# Patient Record
Sex: Female | Born: 1955 | Race: White | Hispanic: No
Health system: Southern US, Community
[De-identification: ages and names within clinical notes are randomized; demographics above are authoritative.]

## PROBLEM LIST (undated history)

## (undated) DIAGNOSIS — I6523 Occlusion and stenosis of bilateral carotid arteries: Secondary | ICD-10-CM

## (undated) DIAGNOSIS — I639 Cerebral infarction, unspecified: Secondary | ICD-10-CM

## (undated) DIAGNOSIS — F418 Other specified anxiety disorders: Secondary | ICD-10-CM

## (undated) DIAGNOSIS — Z87442 Personal history of urinary calculi: Secondary | ICD-10-CM

## (undated) DIAGNOSIS — L0292 Furuncle, unspecified: Secondary | ICD-10-CM

## (undated) DIAGNOSIS — J449 Chronic obstructive pulmonary disease, unspecified: Secondary | ICD-10-CM

## (undated) DIAGNOSIS — Z951 Presence of aortocoronary bypass graft: Secondary | ICD-10-CM

## (undated) DIAGNOSIS — M069 Rheumatoid arthritis, unspecified: Secondary | ICD-10-CM

## (undated) DIAGNOSIS — R51 Headache: Secondary | ICD-10-CM

## (undated) DIAGNOSIS — E669 Obesity, unspecified: Secondary | ICD-10-CM

## (undated) DIAGNOSIS — I480 Paroxysmal atrial fibrillation: Secondary | ICD-10-CM

## (undated) DIAGNOSIS — I739 Peripheral vascular disease, unspecified: Secondary | ICD-10-CM

## (undated) DIAGNOSIS — I2119 ST elevation (STEMI) myocardial infarction involving other coronary artery of inferior wall: Secondary | ICD-10-CM

## (undated) DIAGNOSIS — R519 Headache, unspecified: Secondary | ICD-10-CM

## (undated) DIAGNOSIS — Z940 Kidney transplant status: Secondary | ICD-10-CM

## (undated) DIAGNOSIS — F419 Anxiety disorder, unspecified: Secondary | ICD-10-CM

## (undated) DIAGNOSIS — I200 Unstable angina: Secondary | ICD-10-CM

## (undated) DIAGNOSIS — Z8719 Personal history of other diseases of the digestive system: Secondary | ICD-10-CM

## (undated) DIAGNOSIS — F329 Major depressive disorder, single episode, unspecified: Secondary | ICD-10-CM

## (undated) DIAGNOSIS — Z9861 Coronary angioplasty status: Secondary | ICD-10-CM

## (undated) DIAGNOSIS — N186 End stage renal disease: Secondary | ICD-10-CM

## (undated) DIAGNOSIS — D649 Anemia, unspecified: Secondary | ICD-10-CM

## (undated) DIAGNOSIS — E1159 Type 2 diabetes mellitus with other circulatory complications: Secondary | ICD-10-CM

## (undated) DIAGNOSIS — G4733 Obstructive sleep apnea (adult) (pediatric): Secondary | ICD-10-CM

## (undated) DIAGNOSIS — K219 Gastro-esophageal reflux disease without esophagitis: Secondary | ICD-10-CM

## (undated) DIAGNOSIS — I2510 Atherosclerotic heart disease of native coronary artery without angina pectoris: Secondary | ICD-10-CM

## (undated) DIAGNOSIS — Z22322 Carrier or suspected carrier of Methicillin resistant Staphylococcus aureus: Secondary | ICD-10-CM

## (undated) DIAGNOSIS — I152 Hypertension secondary to endocrine disorders: Secondary | ICD-10-CM

## (undated) DIAGNOSIS — N018 Rapidly progressive nephritic syndrome with other morphologic changes: Secondary | ICD-10-CM

## (undated) DIAGNOSIS — Z8489 Family history of other specified conditions: Secondary | ICD-10-CM

## (undated) DIAGNOSIS — M797 Fibromyalgia: Secondary | ICD-10-CM

## (undated) DIAGNOSIS — R197 Diarrhea, unspecified: Secondary | ICD-10-CM

## (undated) DIAGNOSIS — E1169 Type 2 diabetes mellitus with other specified complication: Secondary | ICD-10-CM

## (undated) DIAGNOSIS — F32A Depression, unspecified: Secondary | ICD-10-CM

## (undated) DIAGNOSIS — I10 Essential (primary) hypertension: Secondary | ICD-10-CM

## (undated) DIAGNOSIS — L0293 Carbuncle, unspecified: Secondary | ICD-10-CM

## (undated) DIAGNOSIS — J189 Pneumonia, unspecified organism: Secondary | ICD-10-CM

## (undated) DIAGNOSIS — I25810 Atherosclerosis of coronary artery bypass graft(s) without angina pectoris: Secondary | ICD-10-CM

## (undated) DIAGNOSIS — E785 Hyperlipidemia, unspecified: Secondary | ICD-10-CM

## (undated) HISTORY — DX: Peripheral vascular disease, unspecified: I73.9

## (undated) HISTORY — DX: Hypertension secondary to endocrine disorders: I15.2

## (undated) HISTORY — DX: Type 2 diabetes mellitus with other circulatory complications: E11.59

## (undated) HISTORY — DX: Kidney transplant status: Z94.0

## (undated) HISTORY — DX: Rapidly progressive nephritic syndrome with other morphologic changes: N01.8

## (undated) HISTORY — PX: CATHETER REMOVAL: SHX911

## (undated) HISTORY — DX: Type 2 diabetes mellitus with other specified complication: E66.9

## (undated) HISTORY — DX: Major depressive disorder, single episode, unspecified: F32.9

## (undated) HISTORY — DX: Hyperlipidemia, unspecified: E78.5

## (undated) HISTORY — DX: Rheumatoid arthritis, unspecified: M06.9

## (undated) HISTORY — DX: Depression, unspecified: F32.A

## (undated) HISTORY — PX: LAPAROSCOPIC GASTRIC BANDING: SHX1100

## (undated) HISTORY — DX: Obstructive sleep apnea (adult) (pediatric): G47.33

## (undated) HISTORY — DX: Occlusion and stenosis of bilateral carotid arteries: I65.23

## (undated) HISTORY — DX: Anxiety disorder, unspecified: F41.9

## (undated) HISTORY — DX: Presence of aortocoronary bypass graft: Z95.1

## (undated) HISTORY — PX: PERCUTANEOUS CORONARY STENT INTERVENTION (PCI-S): SHX6016

## (undated) HISTORY — DX: Paroxysmal atrial fibrillation: I48.0

## (undated) HISTORY — PX: TUBAL LIGATION: SHX77

## (undated) HISTORY — DX: Other specified anxiety disorders: F41.8

## (undated) HISTORY — PX: PORT-A-CATH REMOVAL: SHX5289

## (undated) HISTORY — DX: Personal history of other diseases of the digestive system: Z87.19

## (undated) HISTORY — PX: LEFT HEART CATH AND CORS/GRAFTS ANGIOGRAPHY: CATH118250

## (undated) HISTORY — DX: Essential (primary) hypertension: I10

## (undated) HISTORY — DX: Atherosclerosis of coronary artery bypass graft(s) without angina pectoris: I25.810

## (undated) HISTORY — DX: Type 2 diabetes mellitus with other specified complication: E11.69

## (undated) HISTORY — DX: Furuncle, unspecified: L02.92

## (undated) HISTORY — PX: INCISE AND DRAIN ABCESS: PRO64

## (undated) HISTORY — DX: Carrier or suspected carrier of methicillin resistant Staphylococcus aureus: Z22.322

## (undated) HISTORY — DX: Carbuncle, unspecified: L02.93

## (undated) HISTORY — PX: OTHER SURGICAL HISTORY: SHX169

## (undated) HISTORY — DX: Fibromyalgia: M79.7

## (undated) HISTORY — PX: CARDIAC CATHETERIZATION: SHX172

## (undated) HISTORY — DX: Atherosclerotic heart disease of native coronary artery without angina pectoris: I25.10

## (undated) HISTORY — DX: ST elevation (STEMI) myocardial infarction involving other coronary artery of inferior wall: I21.19

## (undated) HISTORY — DX: Atherosclerotic heart disease of native coronary artery without angina pectoris: Z98.61

## (undated) HISTORY — DX: Chronic obstructive pulmonary disease, unspecified: J44.9

## (undated) HISTORY — DX: End stage renal disease: N18.6

## (undated) HISTORY — PX: MULTIPLE TOOTH EXTRACTIONS: SHX2053

## (undated) HISTORY — DX: Unstable angina: I20.0

---

## 1987-08-18 DIAGNOSIS — N018 Rapidly progressive nephritic syndrome with other morphologic changes: Secondary | ICD-10-CM

## 1987-08-18 HISTORY — DX: Rapidly progressive nephritic syndrome with other morphologic changes: N01.8

## 1989-08-17 DIAGNOSIS — N186 End stage renal disease: Secondary | ICD-10-CM | POA: Diagnosis present

## 1989-08-17 DIAGNOSIS — Z940 Kidney transplant status: Secondary | ICD-10-CM

## 1989-08-17 HISTORY — PX: KIDNEY TRANSPLANT: SHX239

## 1989-08-17 HISTORY — DX: End stage renal disease: N18.6

## 1989-08-17 HISTORY — DX: Kidney transplant status: Z94.0

## 1992-08-17 HISTORY — PX: CORONARY ANGIOPLASTY: SHX604

## 1993-07-17 DIAGNOSIS — I25110 Atherosclerotic heart disease of native coronary artery with unstable angina pectoris: Secondary | ICD-10-CM | POA: Insufficient documentation

## 1993-07-17 DIAGNOSIS — I2119 ST elevation (STEMI) myocardial infarction involving other coronary artery of inferior wall: Secondary | ICD-10-CM | POA: Insufficient documentation

## 1993-07-17 DIAGNOSIS — I2510 Atherosclerotic heart disease of native coronary artery without angina pectoris: Secondary | ICD-10-CM | POA: Insufficient documentation

## 1993-07-17 DIAGNOSIS — I25119 Atherosclerotic heart disease of native coronary artery with unspecified angina pectoris: Secondary | ICD-10-CM | POA: Insufficient documentation

## 1993-07-17 HISTORY — DX: Atherosclerotic heart disease of native coronary artery without angina pectoris: I25.10

## 1993-07-17 HISTORY — DX: ST elevation (STEMI) myocardial infarction involving other coronary artery of inferior wall: I21.19

## 1993-08-17 DIAGNOSIS — Z951 Presence of aortocoronary bypass graft: Secondary | ICD-10-CM

## 1993-08-17 HISTORY — DX: Presence of aortocoronary bypass graft: Z95.1

## 1993-08-17 HISTORY — PX: CORONARY ARTERY BYPASS GRAFT: SHX141

## 1993-09-07 DIAGNOSIS — Z951 Presence of aortocoronary bypass graft: Secondary | ICD-10-CM | POA: Insufficient documentation

## 1997-11-23 ENCOUNTER — Other Ambulatory Visit: Admission: RE | Admit: 1997-11-23 | Discharge: 1997-11-23 | Payer: Self-pay | Admitting: Nephrology

## 1998-01-29 ENCOUNTER — Other Ambulatory Visit: Admission: RE | Admit: 1998-01-29 | Discharge: 1998-01-29 | Payer: Self-pay | Admitting: Nephrology

## 1998-04-02 ENCOUNTER — Emergency Department (HOSPITAL_COMMUNITY): Admission: EM | Admit: 1998-04-02 | Discharge: 1998-04-02 | Payer: Self-pay | Admitting: Emergency Medicine

## 1998-04-02 ENCOUNTER — Encounter: Payer: Self-pay | Admitting: Emergency Medicine

## 1998-05-04 ENCOUNTER — Emergency Department (HOSPITAL_COMMUNITY): Admission: EM | Admit: 1998-05-04 | Discharge: 1998-05-04 | Payer: Self-pay | Admitting: Emergency Medicine

## 1998-05-27 ENCOUNTER — Other Ambulatory Visit: Admission: RE | Admit: 1998-05-27 | Discharge: 1998-05-27 | Payer: Self-pay | Admitting: Critical Care Medicine

## 1999-06-09 ENCOUNTER — Encounter: Admission: RE | Admit: 1999-06-09 | Discharge: 1999-09-07 | Payer: Self-pay | Admitting: Nephrology

## 1999-06-25 ENCOUNTER — Encounter: Payer: Self-pay | Admitting: Nephrology

## 1999-06-25 ENCOUNTER — Encounter: Admission: RE | Admit: 1999-06-25 | Discharge: 1999-06-25 | Payer: Self-pay | Admitting: Nephrology

## 1999-07-01 ENCOUNTER — Encounter: Payer: Self-pay | Admitting: Nephrology

## 1999-07-01 ENCOUNTER — Encounter: Admission: RE | Admit: 1999-07-01 | Discharge: 1999-07-01 | Payer: Self-pay | Admitting: Nephrology

## 1999-07-26 ENCOUNTER — Inpatient Hospital Stay (HOSPITAL_COMMUNITY): Admission: EM | Admit: 1999-07-26 | Discharge: 1999-07-29 | Payer: Self-pay | Admitting: Emergency Medicine

## 1999-07-26 ENCOUNTER — Encounter: Payer: Self-pay | Admitting: Nephrology

## 1999-10-17 ENCOUNTER — Other Ambulatory Visit: Admission: RE | Admit: 1999-10-17 | Discharge: 1999-10-17 | Payer: Self-pay | Admitting: Obstetrics and Gynecology

## 1999-11-05 ENCOUNTER — Ambulatory Visit (HOSPITAL_COMMUNITY): Admission: RE | Admit: 1999-11-05 | Discharge: 1999-11-05 | Payer: Self-pay | Admitting: Pulmonary Disease

## 1999-11-05 ENCOUNTER — Encounter: Payer: Self-pay | Admitting: Pulmonary Disease

## 1999-11-19 ENCOUNTER — Encounter: Admission: RE | Admit: 1999-11-19 | Discharge: 2000-02-17 | Payer: Self-pay | Admitting: Nephrology

## 1999-12-24 ENCOUNTER — Inpatient Hospital Stay (HOSPITAL_COMMUNITY): Admission: EM | Admit: 1999-12-24 | Discharge: 1999-12-26 | Payer: Self-pay | Admitting: Emergency Medicine

## 1999-12-24 ENCOUNTER — Encounter: Payer: Self-pay | Admitting: Cardiovascular Disease

## 1999-12-25 ENCOUNTER — Encounter: Payer: Self-pay | Admitting: Cardiovascular Disease

## 2000-01-13 ENCOUNTER — Encounter: Payer: Self-pay | Admitting: Otolaryngology

## 2000-01-13 ENCOUNTER — Encounter: Admission: RE | Admit: 2000-01-13 | Discharge: 2000-01-13 | Payer: Self-pay | Admitting: Otolaryngology

## 2000-01-27 ENCOUNTER — Encounter: Admission: RE | Admit: 2000-01-27 | Discharge: 2000-01-27 | Payer: Self-pay | Admitting: Obstetrics and Gynecology

## 2000-01-27 ENCOUNTER — Encounter: Payer: Self-pay | Admitting: Obstetrics and Gynecology

## 2000-04-16 ENCOUNTER — Ambulatory Visit (HOSPITAL_BASED_OUTPATIENT_CLINIC_OR_DEPARTMENT_OTHER): Admission: RE | Admit: 2000-04-16 | Discharge: 2000-04-16 | Payer: Self-pay | Admitting: Pulmonary Disease

## 2000-05-26 ENCOUNTER — Encounter: Admission: RE | Admit: 2000-05-26 | Discharge: 2000-08-24 | Payer: Self-pay | Admitting: Nephrology

## 2000-06-24 ENCOUNTER — Encounter: Payer: Self-pay | Admitting: Nephrology

## 2000-06-24 ENCOUNTER — Inpatient Hospital Stay (HOSPITAL_COMMUNITY): Admission: EM | Admit: 2000-06-24 | Discharge: 2000-06-26 | Payer: Self-pay | Admitting: Emergency Medicine

## 2000-08-19 ENCOUNTER — Encounter: Payer: Self-pay | Admitting: Nephrology

## 2000-08-19 ENCOUNTER — Encounter: Admission: RE | Admit: 2000-08-19 | Discharge: 2000-08-19 | Payer: Self-pay | Admitting: Nephrology

## 2000-09-08 ENCOUNTER — Encounter: Payer: Self-pay | Admitting: Nephrology

## 2000-09-08 ENCOUNTER — Encounter: Admission: RE | Admit: 2000-09-08 | Discharge: 2000-09-08 | Payer: Self-pay | Admitting: Nephrology

## 2000-09-16 ENCOUNTER — Encounter: Admission: RE | Admit: 2000-09-16 | Discharge: 2000-12-15 | Payer: Self-pay | Admitting: Nephrology

## 2000-10-28 ENCOUNTER — Encounter: Payer: Self-pay | Admitting: Cardiovascular Disease

## 2000-10-28 ENCOUNTER — Ambulatory Visit (HOSPITAL_COMMUNITY): Admission: RE | Admit: 2000-10-28 | Discharge: 2000-10-29 | Payer: Self-pay | Admitting: Cardiovascular Disease

## 2000-12-21 ENCOUNTER — Encounter (HOSPITAL_COMMUNITY): Admission: RE | Admit: 2000-12-21 | Discharge: 2001-03-21 | Payer: Self-pay | Admitting: Nephrology

## 2000-12-22 ENCOUNTER — Encounter (HOSPITAL_COMMUNITY): Admission: RE | Admit: 2000-12-22 | Discharge: 2001-03-22 | Payer: Self-pay | Admitting: Nephrology

## 2000-12-23 ENCOUNTER — Ambulatory Visit (HOSPITAL_COMMUNITY): Admission: RE | Admit: 2000-12-23 | Discharge: 2000-12-23 | Payer: Self-pay | Admitting: Nephrology

## 2000-12-28 ENCOUNTER — Other Ambulatory Visit: Admission: RE | Admit: 2000-12-28 | Discharge: 2000-12-28 | Payer: Self-pay | Admitting: Obstetrics and Gynecology

## 2001-07-23 ENCOUNTER — Inpatient Hospital Stay (HOSPITAL_COMMUNITY): Admission: EM | Admit: 2001-07-23 | Discharge: 2001-07-26 | Payer: Self-pay | Admitting: Emergency Medicine

## 2001-07-24 ENCOUNTER — Encounter: Payer: Self-pay | Admitting: Nephrology

## 2001-08-05 ENCOUNTER — Inpatient Hospital Stay (HOSPITAL_COMMUNITY): Admission: EM | Admit: 2001-08-05 | Discharge: 2001-08-07 | Payer: Self-pay | Admitting: Emergency Medicine

## 2001-12-20 ENCOUNTER — Encounter: Admission: RE | Admit: 2001-12-20 | Discharge: 2001-12-20 | Payer: Self-pay | Admitting: Nephrology

## 2001-12-20 ENCOUNTER — Encounter: Payer: Self-pay | Admitting: Nephrology

## 2001-12-30 ENCOUNTER — Encounter: Admission: RE | Admit: 2001-12-30 | Discharge: 2001-12-30 | Payer: Self-pay | Admitting: Orthopedic Surgery

## 2001-12-30 ENCOUNTER — Encounter: Payer: Self-pay | Admitting: Orthopedic Surgery

## 2002-02-24 ENCOUNTER — Encounter: Payer: Self-pay | Admitting: Nephrology

## 2002-02-24 ENCOUNTER — Encounter: Admission: RE | Admit: 2002-02-24 | Discharge: 2002-02-24 | Payer: Self-pay | Admitting: Nephrology

## 2002-03-31 ENCOUNTER — Encounter: Admission: RE | Admit: 2002-03-31 | Discharge: 2002-03-31 | Payer: Self-pay | Admitting: Nephrology

## 2002-03-31 ENCOUNTER — Encounter: Payer: Self-pay | Admitting: Nephrology

## 2002-04-10 ENCOUNTER — Encounter: Payer: Self-pay | Admitting: Cardiovascular Disease

## 2002-04-10 ENCOUNTER — Inpatient Hospital Stay (HOSPITAL_COMMUNITY): Admission: AD | Admit: 2002-04-10 | Discharge: 2002-04-13 | Payer: Self-pay | Admitting: Cardiovascular Disease

## 2002-04-13 ENCOUNTER — Encounter: Payer: Self-pay | Admitting: Cardiovascular Disease

## 2002-04-19 ENCOUNTER — Encounter: Payer: Self-pay | Admitting: Nephrology

## 2002-04-19 ENCOUNTER — Encounter: Admission: RE | Admit: 2002-04-19 | Discharge: 2002-04-19 | Payer: Self-pay | Admitting: Nephrology

## 2002-04-23 ENCOUNTER — Inpatient Hospital Stay (HOSPITAL_COMMUNITY): Admission: EM | Admit: 2002-04-23 | Discharge: 2002-05-01 | Payer: Self-pay | Admitting: *Deleted

## 2002-04-23 ENCOUNTER — Encounter (INDEPENDENT_AMBULATORY_CARE_PROVIDER_SITE_OTHER): Payer: Self-pay | Admitting: *Deleted

## 2002-04-23 ENCOUNTER — Encounter: Payer: Self-pay | Admitting: Nephrology

## 2002-04-25 ENCOUNTER — Encounter: Payer: Self-pay | Admitting: Nephrology

## 2002-05-01 ENCOUNTER — Encounter: Payer: Self-pay | Admitting: Nephrology

## 2002-05-05 ENCOUNTER — Encounter (HOSPITAL_COMMUNITY): Admission: RE | Admit: 2002-05-05 | Discharge: 2002-08-03 | Payer: Self-pay | Admitting: Nephrology

## 2002-08-14 ENCOUNTER — Encounter (HOSPITAL_COMMUNITY): Admission: RE | Admit: 2002-08-14 | Discharge: 2002-11-12 | Payer: Self-pay | Admitting: Nephrology

## 2002-12-31 ENCOUNTER — Emergency Department (HOSPITAL_COMMUNITY): Admission: AD | Admit: 2002-12-31 | Discharge: 2002-12-31 | Payer: Self-pay | Admitting: Emergency Medicine

## 2003-01-17 ENCOUNTER — Encounter: Payer: Self-pay | Admitting: Nephrology

## 2003-01-17 ENCOUNTER — Encounter: Admission: RE | Admit: 2003-01-17 | Discharge: 2003-01-17 | Payer: Self-pay | Admitting: Nephrology

## 2003-02-14 ENCOUNTER — Emergency Department (HOSPITAL_COMMUNITY): Admission: AD | Admit: 2003-02-14 | Discharge: 2003-02-14 | Payer: Self-pay | Admitting: *Deleted

## 2003-02-14 ENCOUNTER — Encounter: Payer: Self-pay | Admitting: Emergency Medicine

## 2003-03-30 ENCOUNTER — Encounter: Payer: Self-pay | Admitting: Orthopedic Surgery

## 2003-04-02 ENCOUNTER — Ambulatory Visit (HOSPITAL_COMMUNITY): Admission: RE | Admit: 2003-04-02 | Discharge: 2003-04-03 | Payer: Self-pay | Admitting: Orthopedic Surgery

## 2003-06-06 ENCOUNTER — Encounter: Payer: Self-pay | Admitting: Cardiovascular Disease

## 2003-06-06 ENCOUNTER — Encounter: Payer: Self-pay | Admitting: Cardiology

## 2003-06-06 ENCOUNTER — Inpatient Hospital Stay (HOSPITAL_COMMUNITY): Admission: EM | Admit: 2003-06-06 | Discharge: 2003-06-09 | Payer: Self-pay | Admitting: Emergency Medicine

## 2003-06-29 ENCOUNTER — Encounter (HOSPITAL_COMMUNITY): Admission: RE | Admit: 2003-06-29 | Discharge: 2003-06-29 | Payer: Self-pay | Admitting: Nephrology

## 2003-06-29 ENCOUNTER — Encounter: Admission: RE | Admit: 2003-06-29 | Discharge: 2003-06-29 | Payer: Self-pay | Admitting: Nephrology

## 2003-06-30 ENCOUNTER — Inpatient Hospital Stay (HOSPITAL_COMMUNITY): Admission: EM | Admit: 2003-06-30 | Discharge: 2003-07-04 | Payer: Self-pay | Admitting: Emergency Medicine

## 2003-07-03 ENCOUNTER — Encounter (INDEPENDENT_AMBULATORY_CARE_PROVIDER_SITE_OTHER): Payer: Self-pay | Admitting: *Deleted

## 2003-07-04 ENCOUNTER — Encounter: Admission: RE | Admit: 2003-07-04 | Discharge: 2003-10-02 | Payer: Self-pay | Admitting: Nephrology

## 2003-07-09 ENCOUNTER — Other Ambulatory Visit: Admission: RE | Admit: 2003-07-09 | Discharge: 2003-07-09 | Payer: Self-pay | Admitting: Obstetrics and Gynecology

## 2003-09-10 ENCOUNTER — Encounter: Admission: RE | Admit: 2003-09-10 | Discharge: 2003-12-09 | Payer: Self-pay | Admitting: General Surgery

## 2003-09-14 ENCOUNTER — Ambulatory Visit (HOSPITAL_COMMUNITY): Admission: RE | Admit: 2003-09-14 | Discharge: 2003-09-14 | Payer: Self-pay | Admitting: General Surgery

## 2003-10-05 ENCOUNTER — Encounter (HOSPITAL_COMMUNITY): Admission: RE | Admit: 2003-10-05 | Discharge: 2004-01-03 | Payer: Self-pay | Admitting: Nephrology

## 2004-01-11 ENCOUNTER — Encounter (HOSPITAL_COMMUNITY): Admission: RE | Admit: 2004-01-11 | Discharge: 2004-04-10 | Payer: Self-pay | Admitting: Nephrology

## 2004-01-12 ENCOUNTER — Ambulatory Visit (HOSPITAL_COMMUNITY): Admission: RE | Admit: 2004-01-12 | Discharge: 2004-01-12 | Payer: Self-pay | Admitting: Physician Assistant

## 2004-03-03 ENCOUNTER — Encounter: Admission: RE | Admit: 2004-03-03 | Discharge: 2004-03-03 | Payer: Self-pay | Admitting: General Surgery

## 2004-03-06 ENCOUNTER — Encounter: Admission: RE | Admit: 2004-03-06 | Discharge: 2004-03-06 | Payer: Self-pay | Admitting: General Surgery

## 2004-03-20 ENCOUNTER — Ambulatory Visit (HOSPITAL_COMMUNITY): Admission: RE | Admit: 2004-03-20 | Discharge: 2004-03-20 | Payer: Self-pay | Admitting: General Surgery

## 2004-04-15 ENCOUNTER — Encounter (HOSPITAL_COMMUNITY): Admission: RE | Admit: 2004-04-15 | Discharge: 2004-07-14 | Payer: Self-pay | Admitting: Nephrology

## 2004-04-18 ENCOUNTER — Encounter: Admission: RE | Admit: 2004-04-18 | Discharge: 2004-07-17 | Payer: Self-pay | Admitting: General Surgery

## 2004-04-29 ENCOUNTER — Observation Stay (HOSPITAL_COMMUNITY): Admission: RE | Admit: 2004-04-29 | Discharge: 2004-04-30 | Payer: Self-pay | Admitting: Surgery

## 2004-05-14 ENCOUNTER — Encounter: Admission: RE | Admit: 2004-05-14 | Discharge: 2004-05-14 | Payer: Self-pay | Admitting: Nephrology

## 2004-07-12 IMAGING — CT CT ABDOMEN W/O CM
1 series · 15 of 32 positions shown, 19 images · IV contrast (GASTRO)
Comparison: none

CLINICAL DATA: Decreased hemoglobin.  Back pain and possible retroperitoneal hemorrhage.

 CT OF THE ABDOMEN WITHOUT CONTRAST ? 06/29/03
 Comparing 02 April, 1998.
TECHNIQUE: Contiguous axial CT images were obtained from the lung bases through the iliac crests following oral contrast but not IV contrast.
TECHNIQUE: Contiguous axial CT images were obtained from the iliac crests to the proximal femurs.

[Series 2: — · axial · 0.98mm/px · z∈[-429,-44]mm · 15 of 86 slices shown, 19 images]
[im 6/86  soft-tissue]
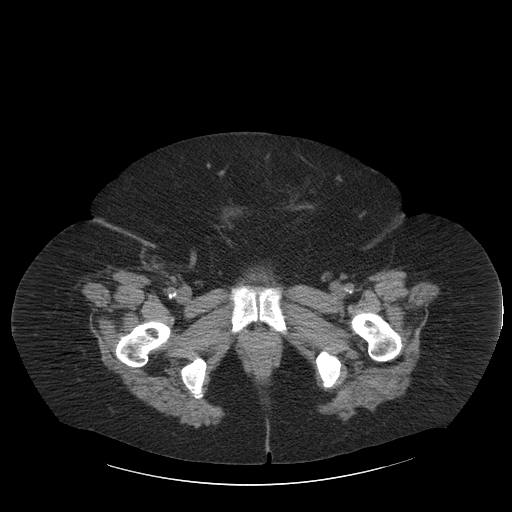
[im 6/86  bone]
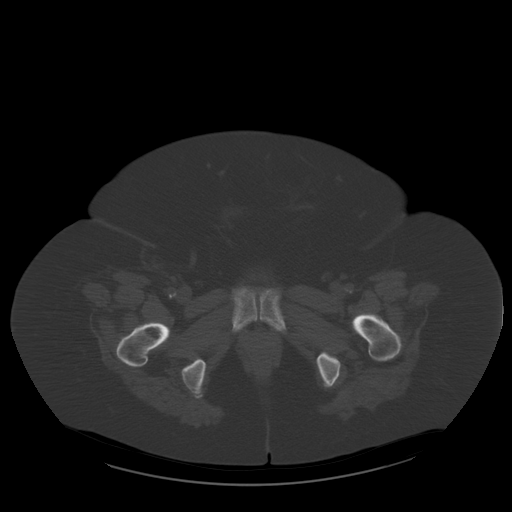
[im 11/86  soft-tissue]
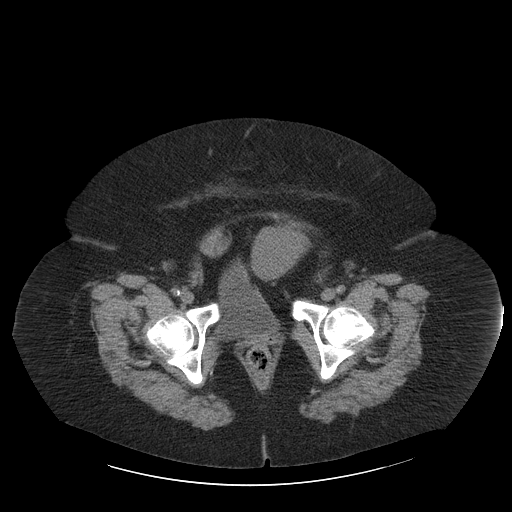
[im 17/86  soft-tissue]
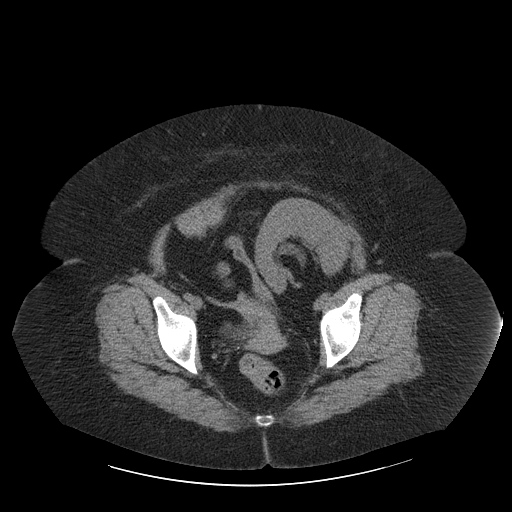
[im 25/86  soft-tissue]
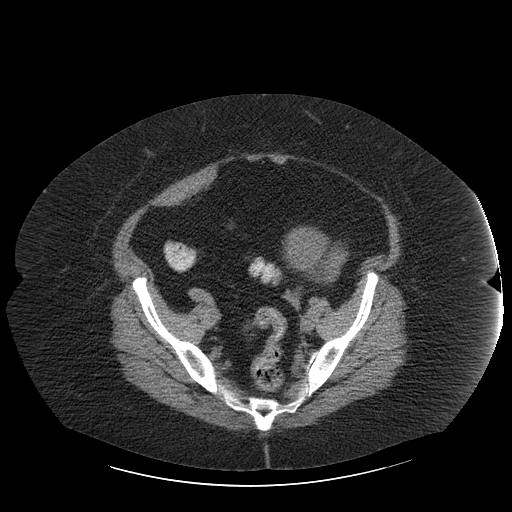
[im 31/86  soft-tissue]
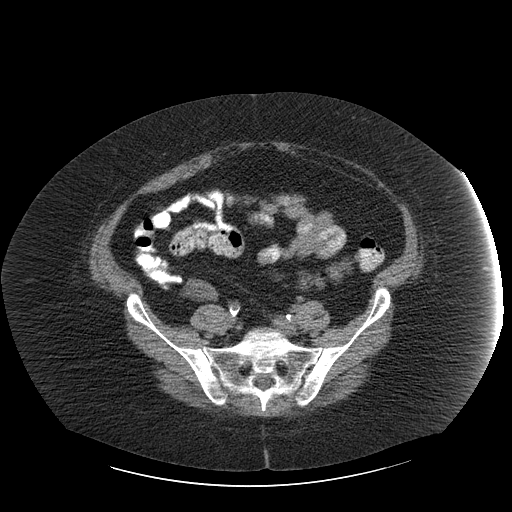
[im 36/86  soft-tissue]
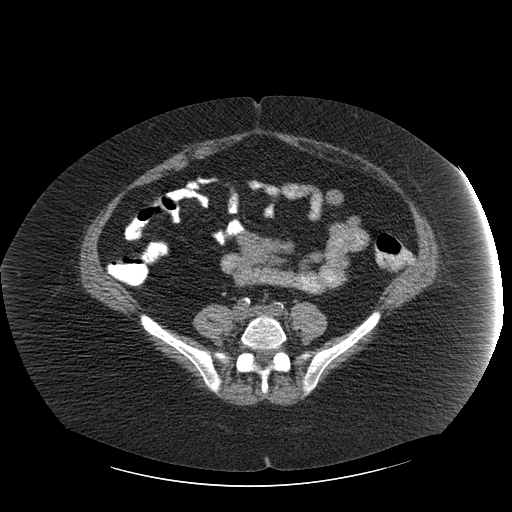
[im 44/86  soft-tissue]
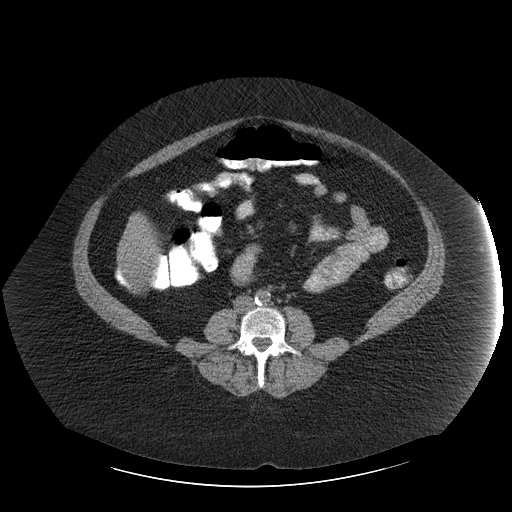
[im 50/86  soft-tissue]
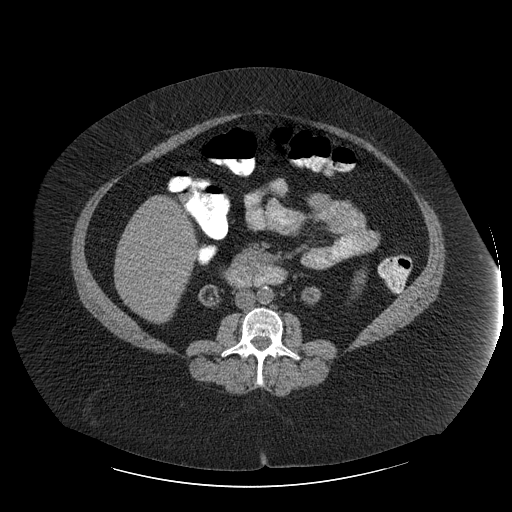
[im 55/86  soft-tissue]
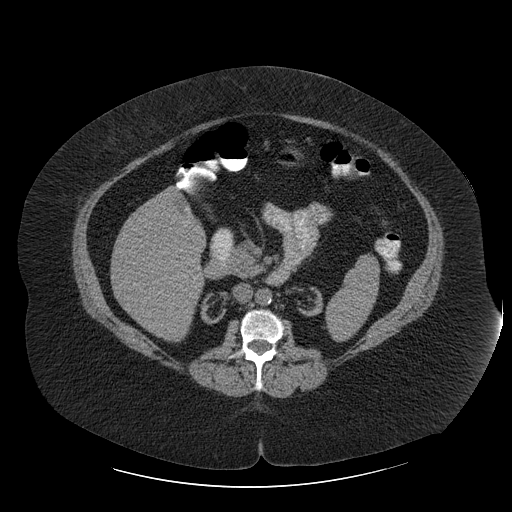
[im 55/86  bone]
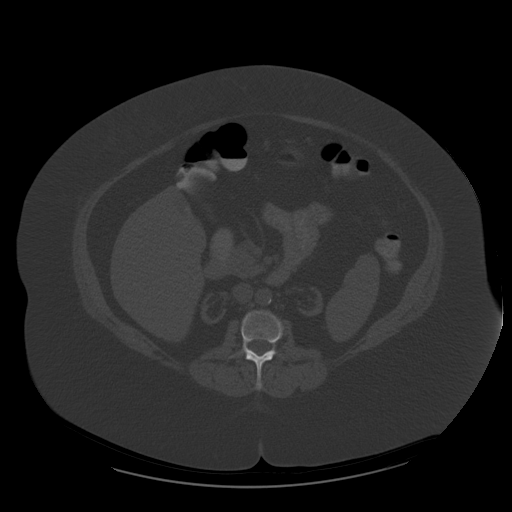
[im 61/86  soft-tissue]
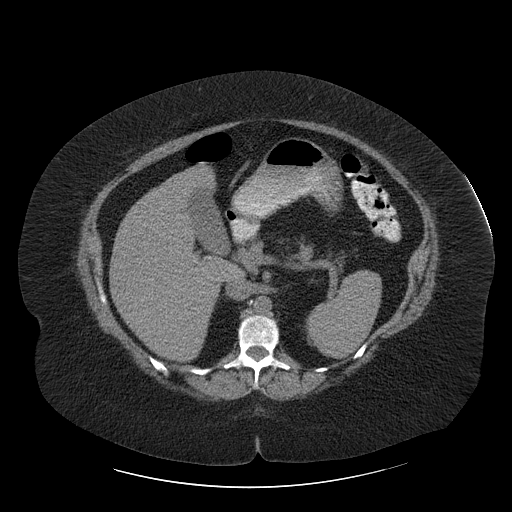
[im 69/86  soft-tissue]
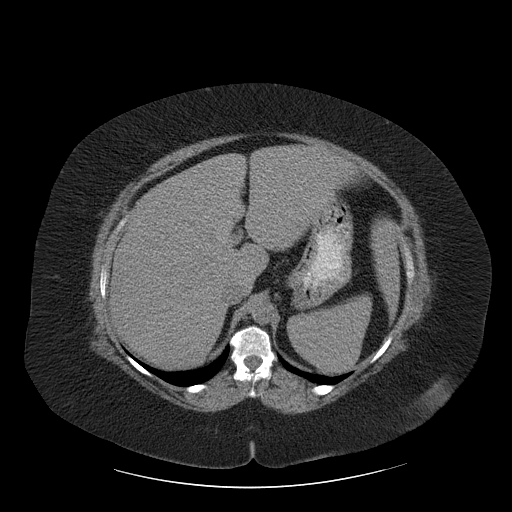
[im 75/86  soft-tissue]
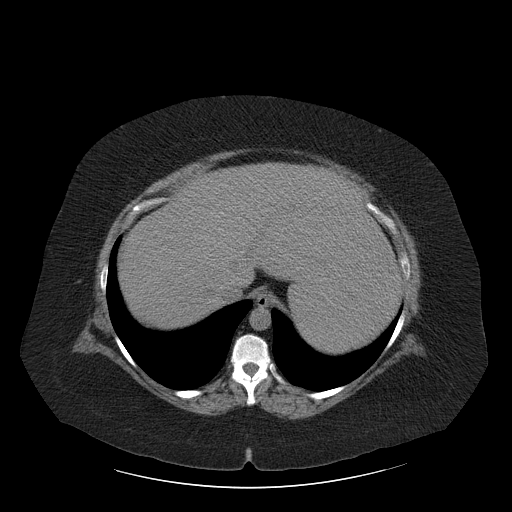
[im 75/86  lung]
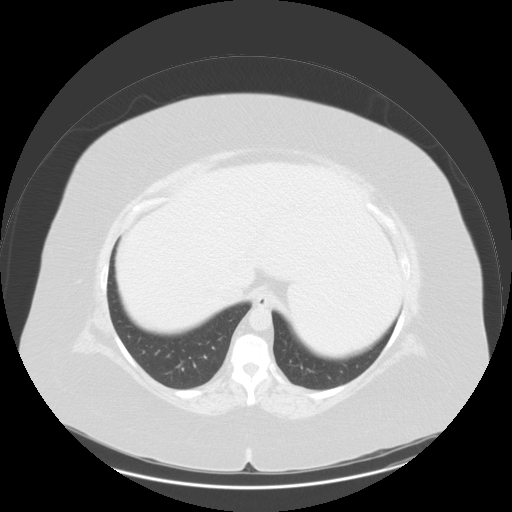
[im 77/86  lung]
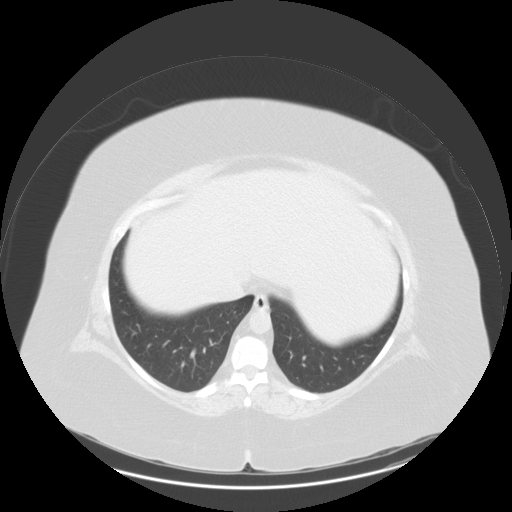
[im 80/86  soft-tissue]
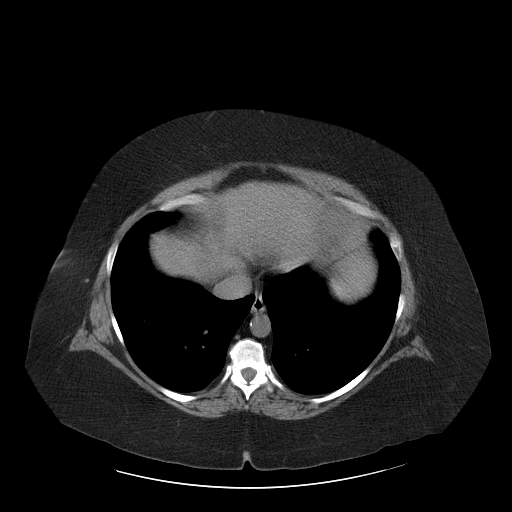
[im 80/86  lung]
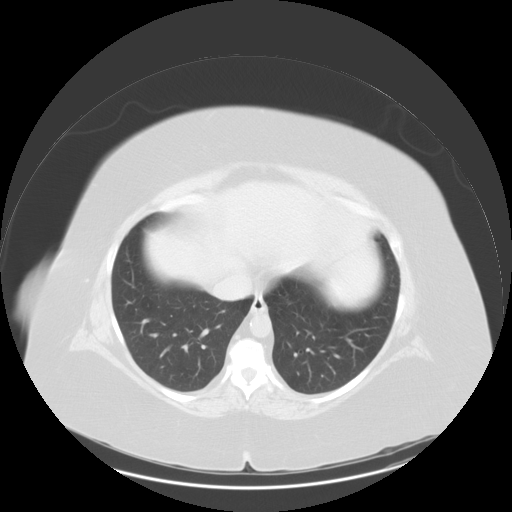
[im 83/86  lung]
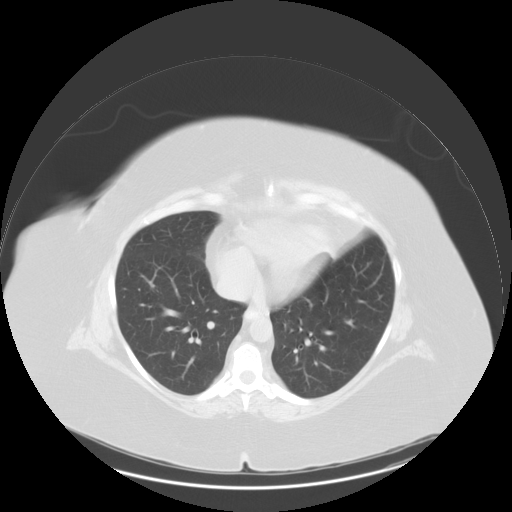

[15 of 32 positions shown; findings below may reference images not displayed]

FINDINGS: The native kidneys are diminutive and atrophic.  The adrenal glands appear unremarkable.  There is fatty atrophy of most of the pancreas with the exception of the pancreatic head, a stable appearance compared to the prior examination.  The liver, spleen, gallbladder, and retroperitoneum appear unremarkable in the noncontrast CT appearance.  Visualized bowel appears normal.

 IMPRESSION
 1.  No evidence of retroperitoneal hemorrhage in the upper abdomen.
 2.  Marked atrophy of the native kidneys.
 3.  Fatty pancreas

 CT OF THE PELVIS WITHOUT CONTRAST ? 06/29/03
FINDINGS: No pelvic hemorrhage is identified.  There is a small amount of stranding in the right groin region which may be from prior intervention, but this does not constitute a significant hematoma.  The left rectus abdominis muscle is markedly atrophic.  There is a transplant kidney in the left iliac fossa, with the ureter extending down to the urinary bladder.  Visualized pelvic bowel appears unremarkable.  A structure related to the right adnexa measures 3.7 x 2.6 cm transversely and sits anterior to the psoas muscle.  This likely represents ovary.  Left-sided ovarian tissue is also noted although there are some adjacent clips related to the transplant.

 IMPRESSION 
 1.  No significant pelvic hematoma.
 2.  Transplant kidney on the left.
 3.  Left rectus abdominis atrophy.

## 2004-08-04 ENCOUNTER — Encounter (HOSPITAL_COMMUNITY): Admission: RE | Admit: 2004-08-04 | Discharge: 2004-11-02 | Payer: Self-pay | Admitting: Nephrology

## 2004-09-03 ENCOUNTER — Encounter: Admission: RE | Admit: 2004-09-03 | Discharge: 2004-09-03 | Payer: Self-pay | Admitting: General Surgery

## 2004-09-11 ENCOUNTER — Ambulatory Visit: Payer: Self-pay | Admitting: Pulmonary Disease

## 2004-09-22 ENCOUNTER — Ambulatory Visit (HOSPITAL_COMMUNITY): Admission: RE | Admit: 2004-09-22 | Discharge: 2004-09-22 | Payer: Self-pay | Admitting: Urology

## 2004-11-14 ENCOUNTER — Encounter (HOSPITAL_COMMUNITY): Admission: RE | Admit: 2004-11-14 | Discharge: 2005-02-12 | Payer: Self-pay | Admitting: Nephrology

## 2004-11-21 ENCOUNTER — Ambulatory Visit: Payer: Self-pay | Admitting: Pulmonary Disease

## 2004-12-25 ENCOUNTER — Encounter: Admission: RE | Admit: 2004-12-25 | Discharge: 2004-12-25 | Payer: Self-pay | Admitting: Nephrology

## 2004-12-25 ENCOUNTER — Ambulatory Visit: Payer: Self-pay | Admitting: Pulmonary Disease

## 2004-12-29 ENCOUNTER — Ambulatory Visit: Payer: Self-pay | Admitting: Cardiology

## 2005-01-19 ENCOUNTER — Ambulatory Visit: Payer: Self-pay | Admitting: Pulmonary Disease

## 2005-01-29 ENCOUNTER — Ambulatory Visit: Payer: Self-pay | Admitting: Pulmonary Disease

## 2005-02-08 ENCOUNTER — Inpatient Hospital Stay (HOSPITAL_COMMUNITY): Admission: EM | Admit: 2005-02-08 | Discharge: 2005-02-11 | Payer: Self-pay | Admitting: Emergency Medicine

## 2005-03-17 IMAGING — US US ABDOMEN COMPLETE
1 series · 14 of 25 positions shown · non-contrast
Comparison: none

CLINICAL DATA: Morbid obesity.  Pre-gastric bypass evaluation.
COMPLETE ABDOMINAL ULTRASOUND ? 03/03/04 
Comparison abdomen CT dated 06/29/03.
Normal appearing gallbladder.  Diffusely echogenic and mildly inhomogeneous liver.  Borderline enlarged spleen measuring approximately 13.5 cm in length.  Atrophied and poorly visualized native kidneys.  Mildly prominent collecting system in the left pelvic transplanted kidney, unchanged.  Normal internal blood flow within the transplant kidney with power Doppler.  Normal appearing pancreas and visualized portions of the inferior vena cava and abdominal aorta.  The aortic bifurcation cannot be visualized.  No gallstones, biliary ductal dilatation, or free peritoneal fluid.  The common duct measures 5.5 mm in maximum diameter.
IMPRESSION
1.  Diffuse fatty infiltration of the liver. 
2.  Borderline splenomegaly.
3.  Atrophic native kidneys.
4.  Stable mild hydronephrosis involving the left pelvic transplant kidney. 
5.  No gallstones.

[Series 1: unknown · 14 of 66 slices shown]
[im 1/66]
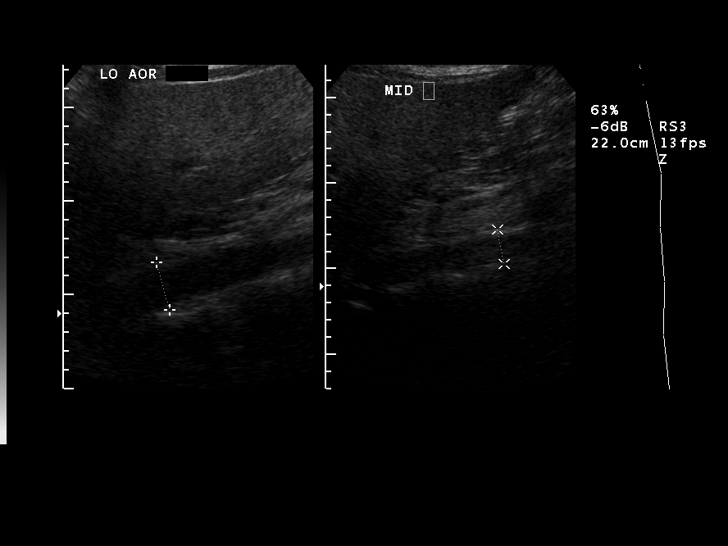
[im 6/66]
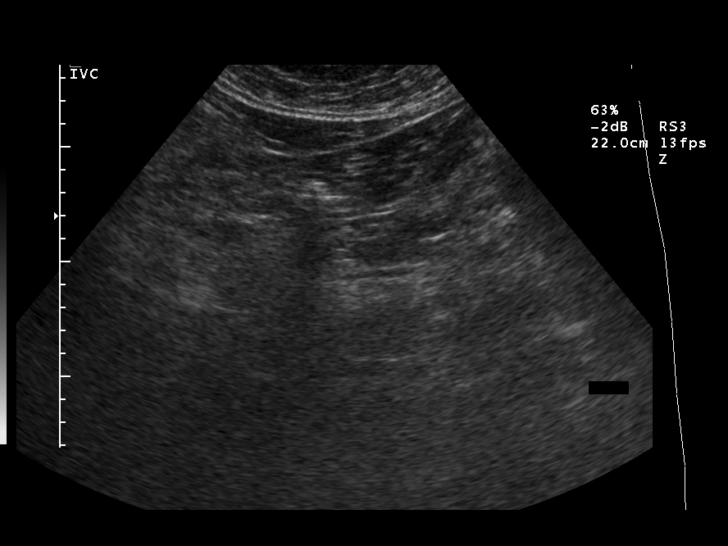
[im 11/66]
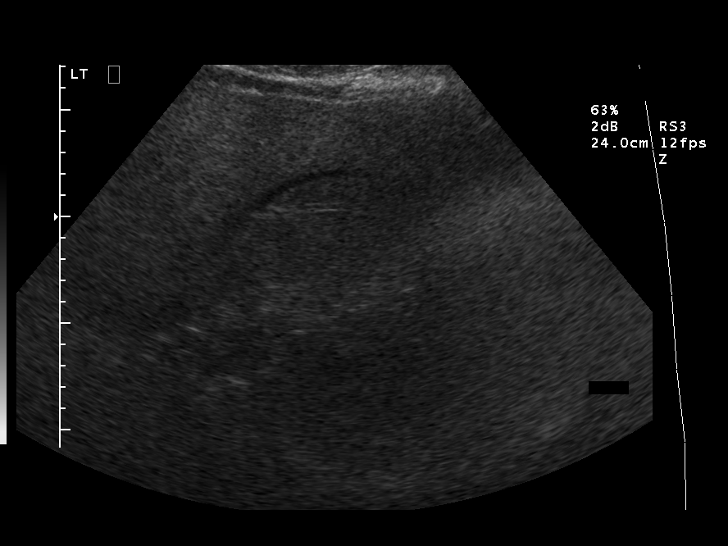
[im 17/66]
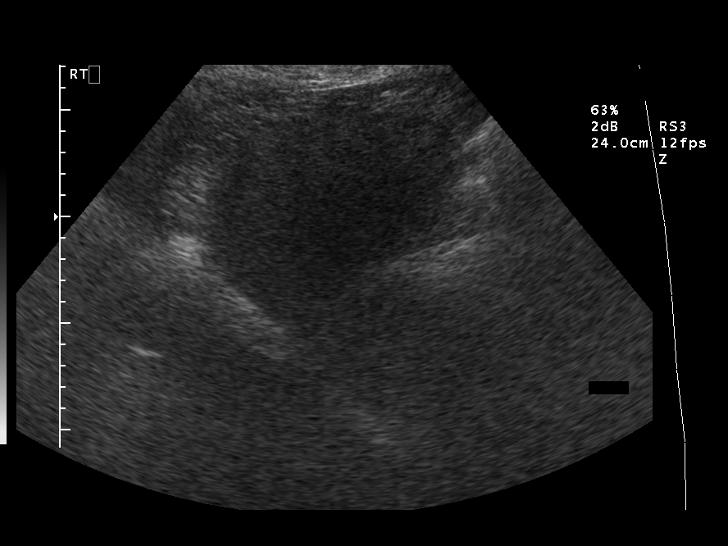
[im 22/66]
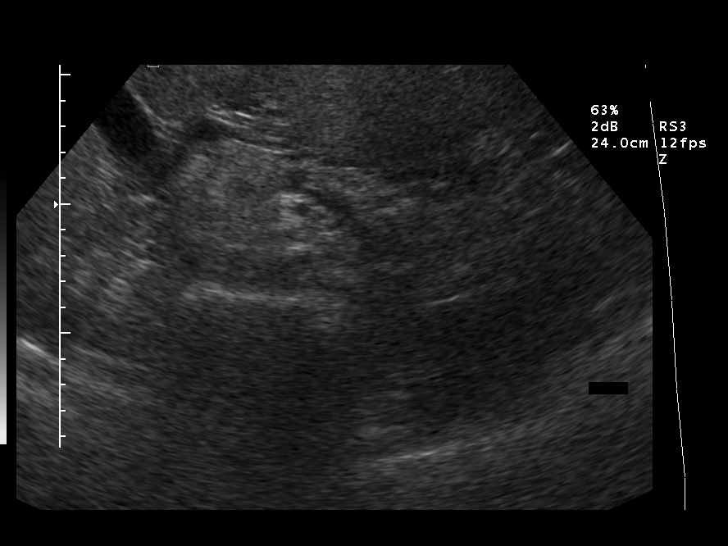
[im 25/66]
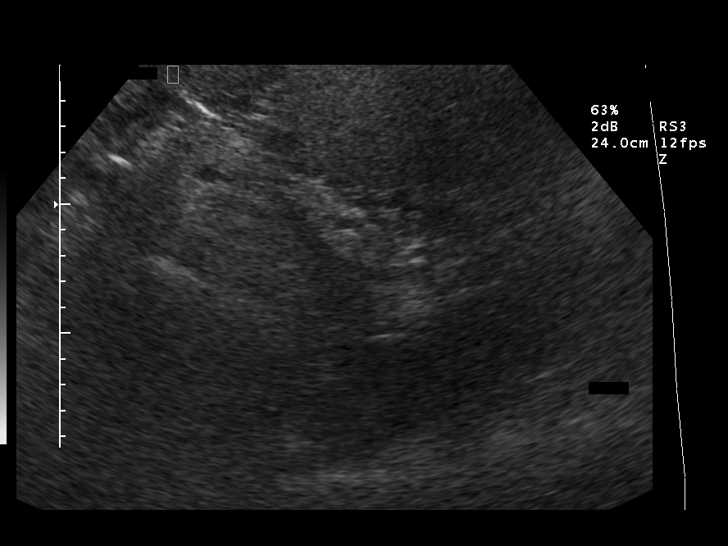
[im 30/66]
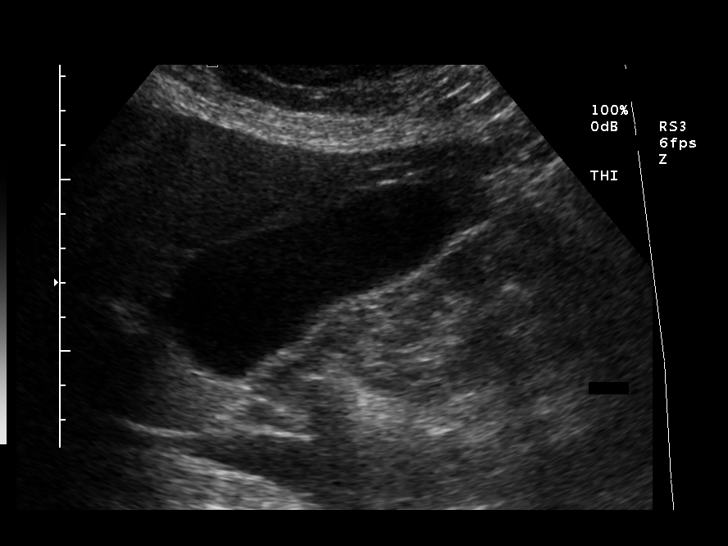
[im 36/66]
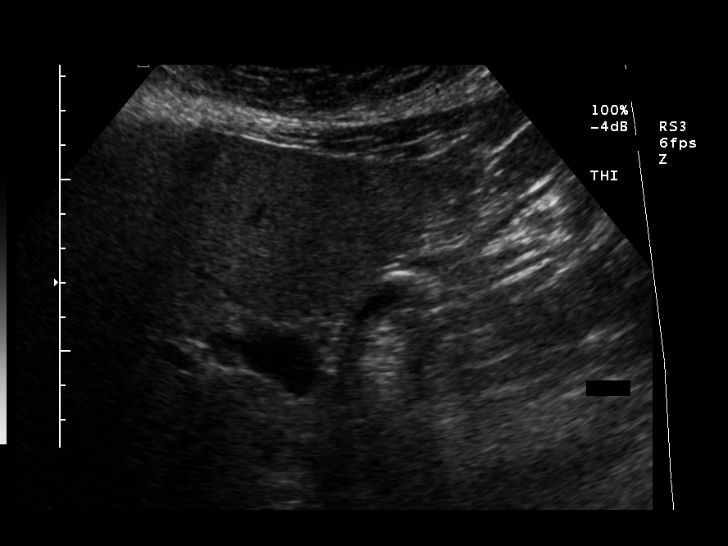
[im 41/66]
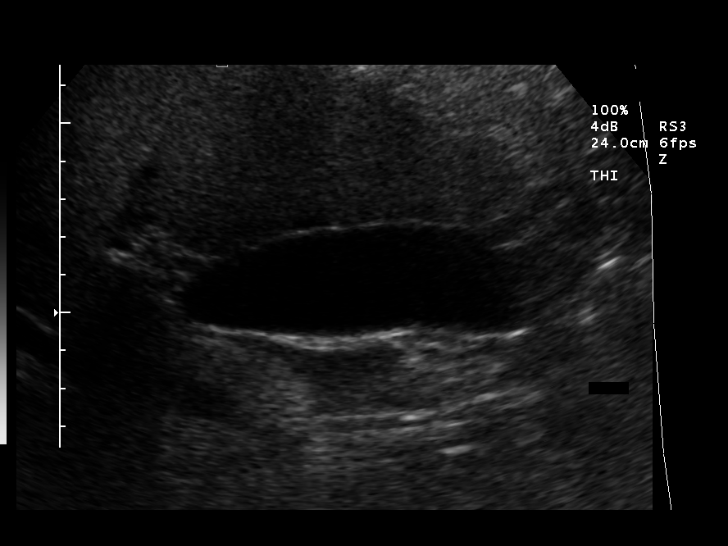
[im 44/66]
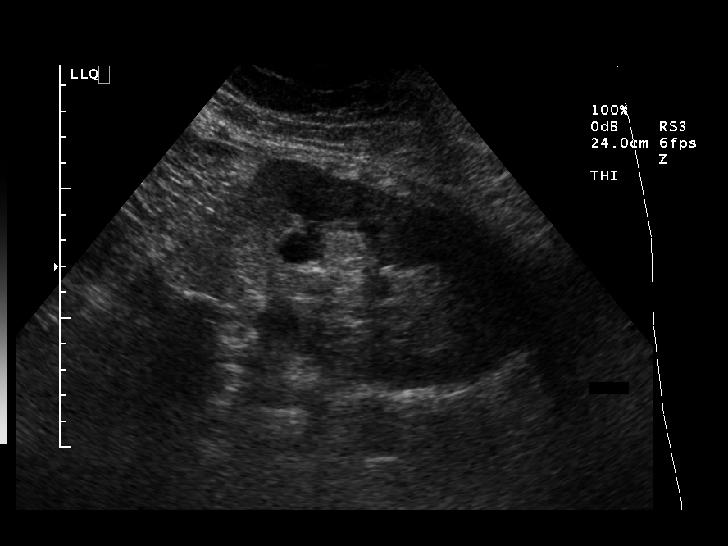
[im 49/66]
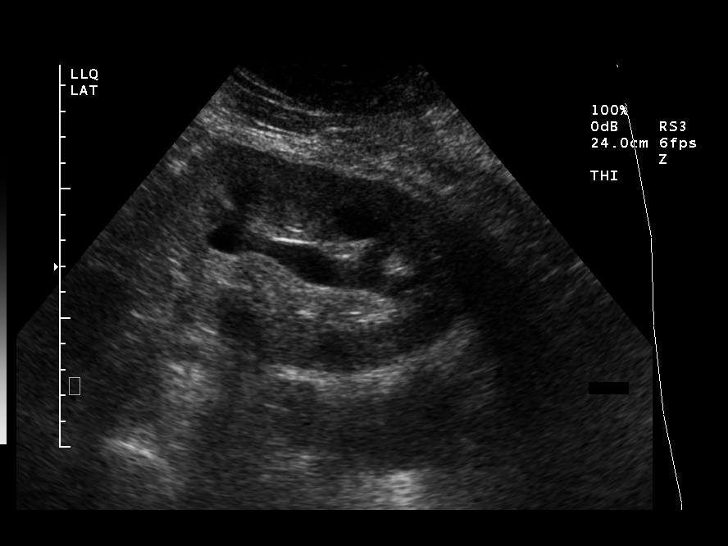
[im 55/66]
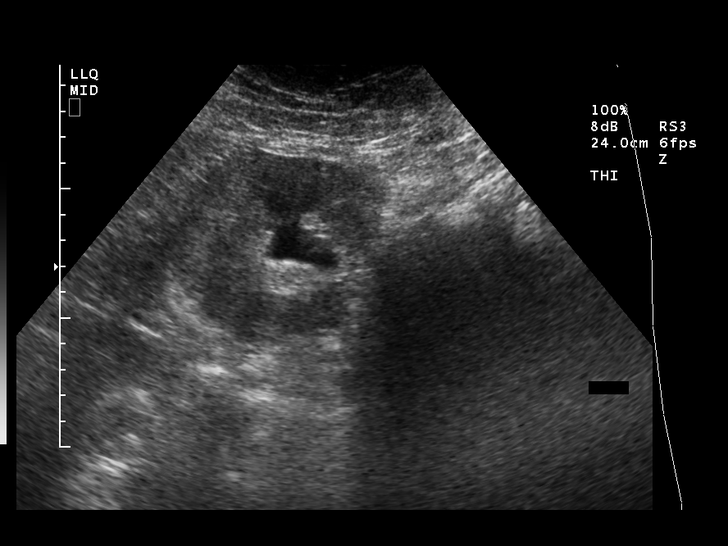
[im 60/66]
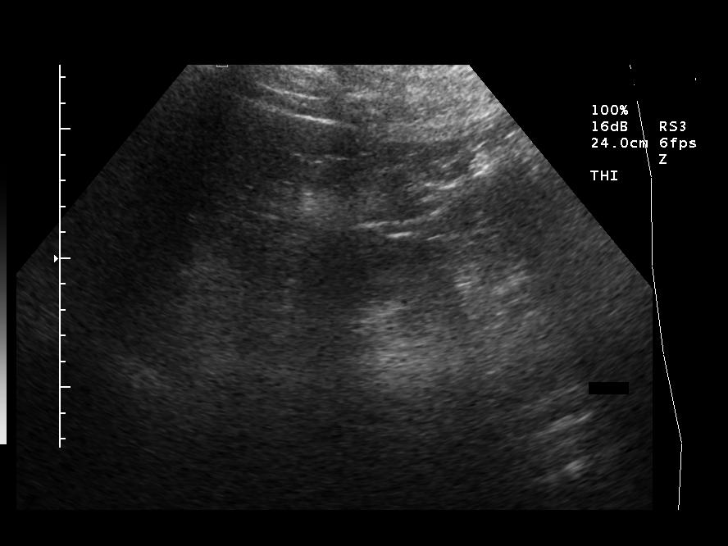
[im 66/66]
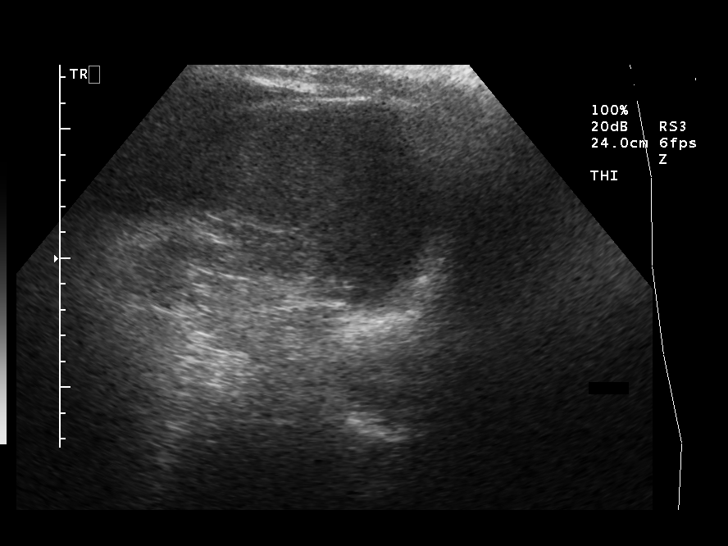

[14 of 25 positions shown; findings below may reference images not displayed]

## 2005-04-06 ENCOUNTER — Encounter (HOSPITAL_COMMUNITY): Admission: RE | Admit: 2005-04-06 | Discharge: 2005-07-05 | Payer: Self-pay | Admitting: Nephrology

## 2005-05-14 IMAGING — CR DG UGI W/ GASTROGRAFIN
9 series · 9 of 9 positions shown · non-contrast
Comparison: none

CLINICAL DATA: Laparoscopic gastric banding procedure on 04/29/04.  Follow-up.
 GASTROGRAFIN UPPER GI 
 With the use of full strength Gastrografin, contrast was seen to pass freely through the esophagus showing that the band is in the region of the cardia of the stomach.  The contrast passed freely through the band into the stomach.  The stomach showed normal emptying.  The reservoir and attached tubes appear to be intact.
 IMPRESSION
 The gastric band appears to be in position in the cardia of the stomach with no significant obstruction to the passage of the Gastrografin seen.  There is normal gastric emptying.  The reservoir and connecting tubal apparatus appear to be intact with the band.

[run (1 of 3)]
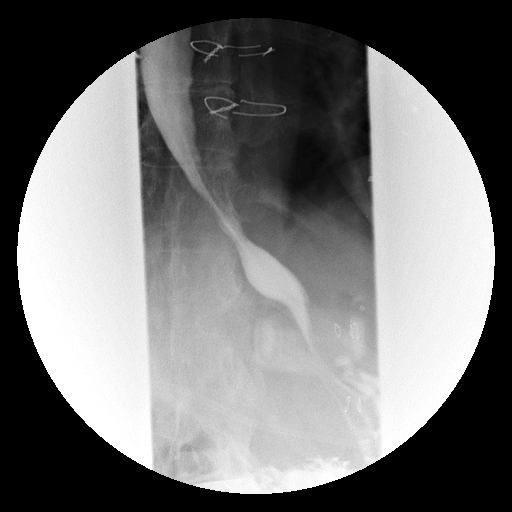

[run (2 of 3)]
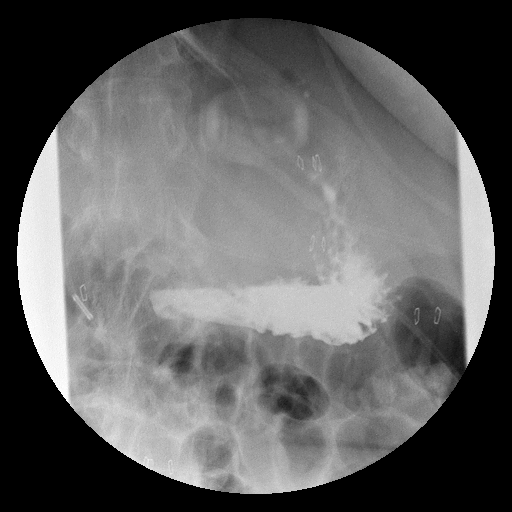

[run (3 of 3)]
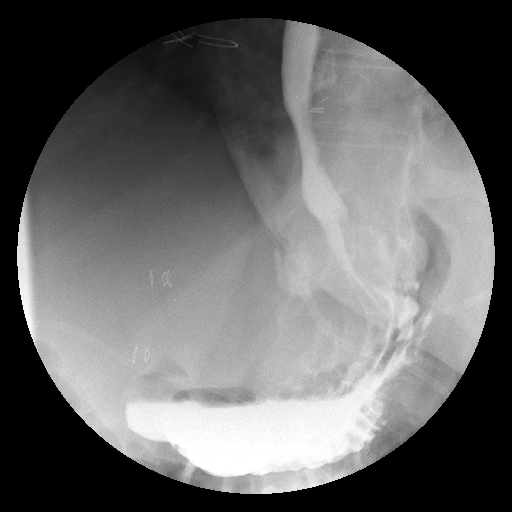

[view not recorded (1 of 6)]
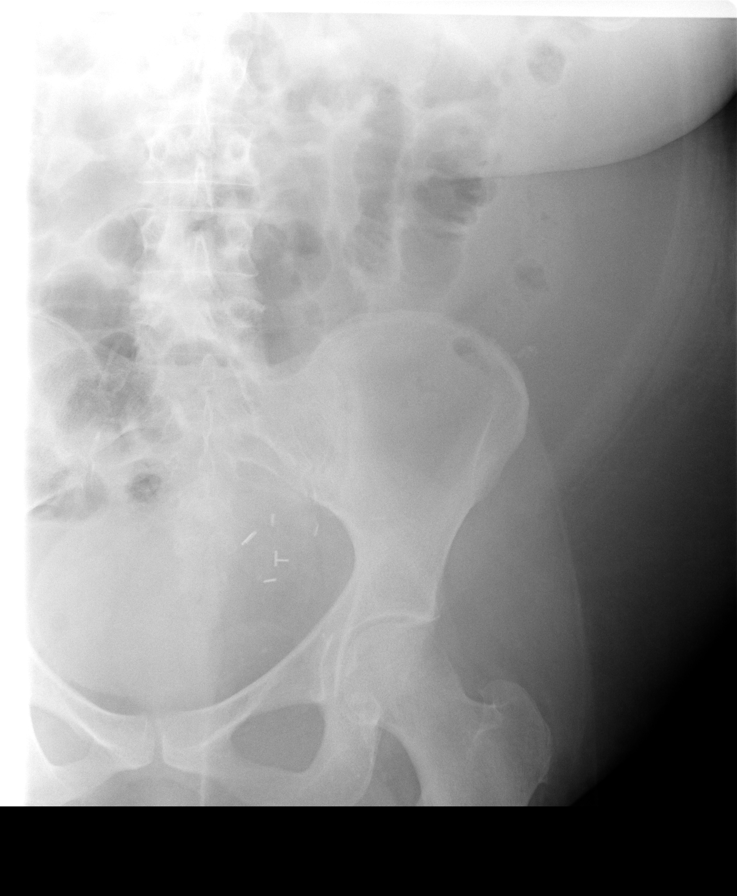

[view not recorded (2 of 6)]
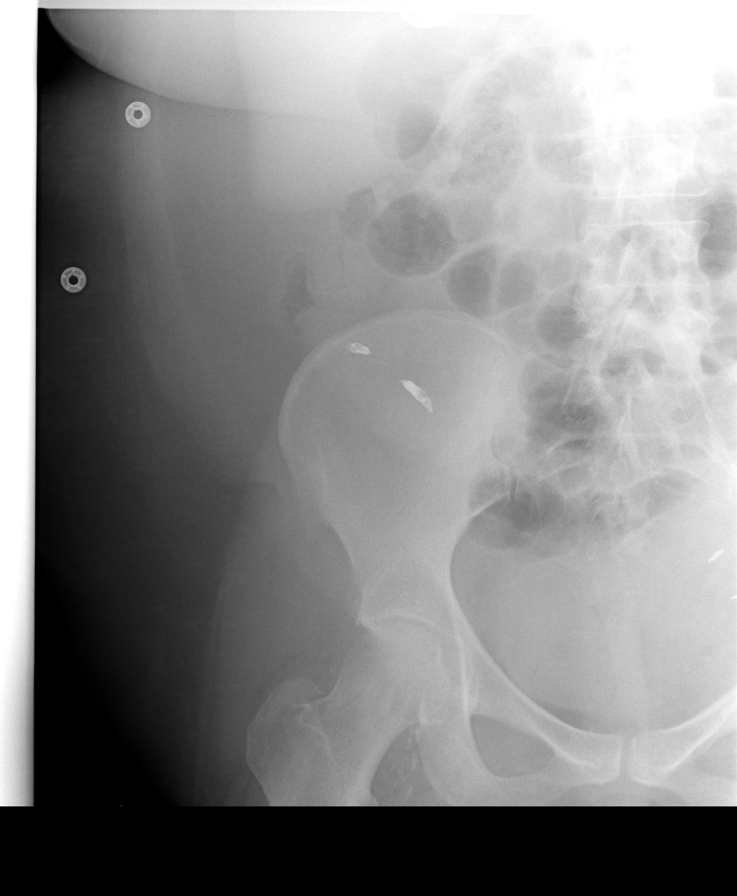

[view not recorded (3 of 6)]
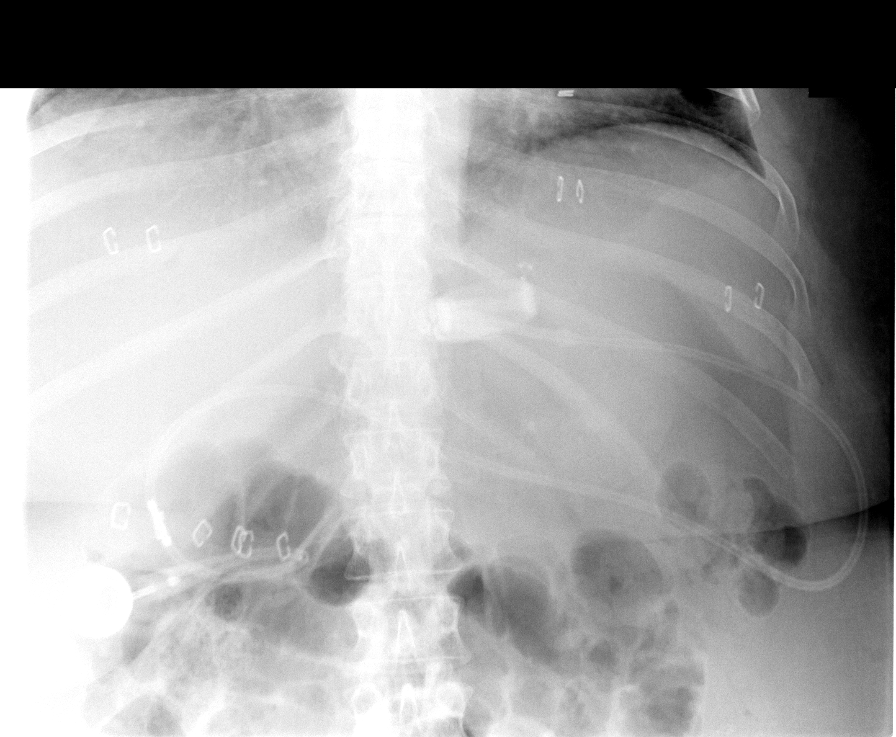

[view not recorded (4 of 6)]
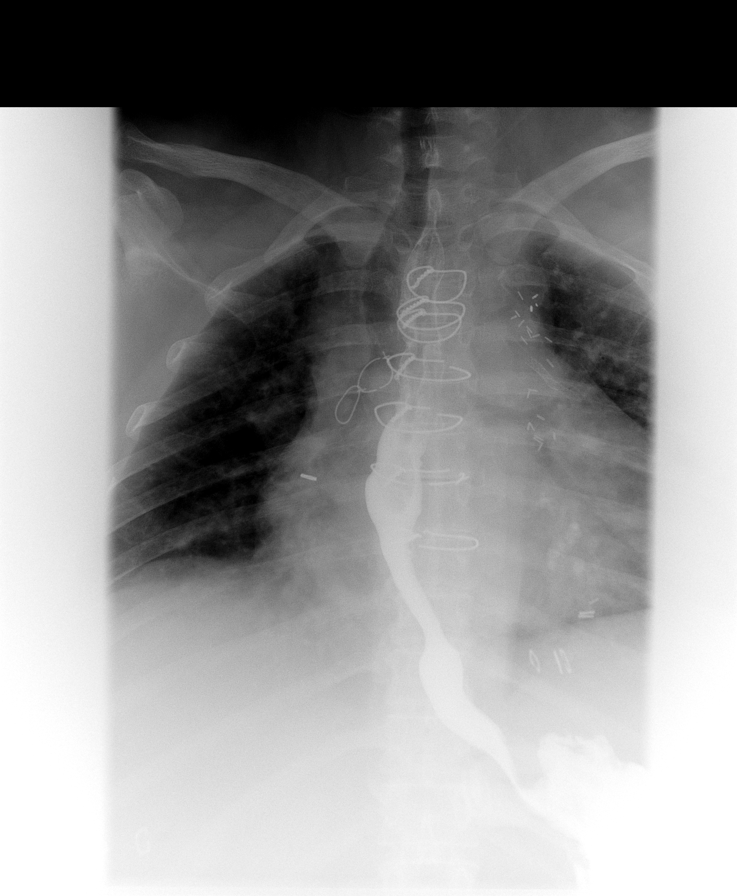

[view not recorded (5 of 6)]
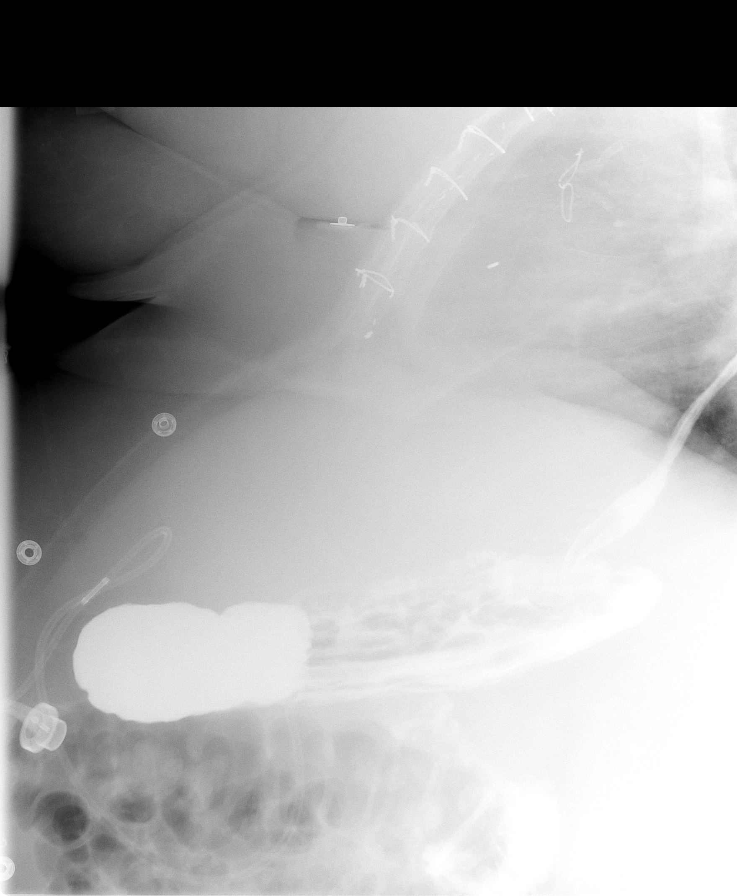

[view not recorded (6 of 6)]
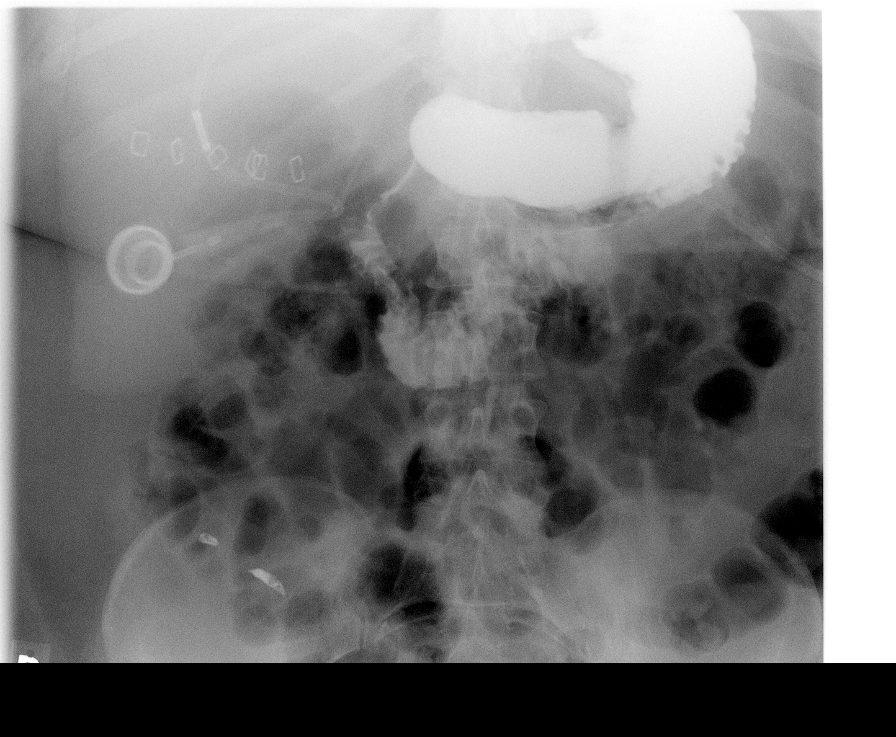

[9 of 9 positions shown; findings below may reference images not displayed]

## 2005-06-12 ENCOUNTER — Ambulatory Visit: Payer: Self-pay | Admitting: Pulmonary Disease

## 2005-06-22 ENCOUNTER — Inpatient Hospital Stay (HOSPITAL_COMMUNITY): Admission: EM | Admit: 2005-06-22 | Discharge: 2005-06-27 | Payer: Self-pay | Admitting: Emergency Medicine

## 2005-06-23 ENCOUNTER — Encounter (INDEPENDENT_AMBULATORY_CARE_PROVIDER_SITE_OTHER): Payer: Self-pay | Admitting: Cardiology

## 2005-07-13 ENCOUNTER — Ambulatory Visit: Payer: Self-pay | Admitting: Pulmonary Disease

## 2005-07-16 ENCOUNTER — Encounter (HOSPITAL_COMMUNITY): Admission: RE | Admit: 2005-07-16 | Discharge: 2005-10-14 | Payer: Self-pay | Admitting: Nephrology

## 2005-08-09 ENCOUNTER — Inpatient Hospital Stay (HOSPITAL_COMMUNITY): Admission: EM | Admit: 2005-08-09 | Discharge: 2005-08-13 | Payer: Self-pay | Admitting: Emergency Medicine

## 2005-08-30 ENCOUNTER — Emergency Department (HOSPITAL_COMMUNITY): Admission: EM | Admit: 2005-08-30 | Discharge: 2005-08-30 | Payer: Self-pay | Admitting: Emergency Medicine

## 2005-09-17 IMAGING — CR DG KNEE COMPLETE 4+V*R*
4 series · 4 of 4 positions shown · non-contrast
Comparison: none

CLINICAL DATA: Fell ? pain and bruising in the left foot, ankle, and knee. 
 LEFT FOOT ? THREE VIEW:
 Three views show no acute radiographic abnormality.  There are vascular calcifications and there is a prominent calcaneal spur.  A small accessory ossicle emanates off of the fifth metatarsal.

[view not recorded (1 of 4)]
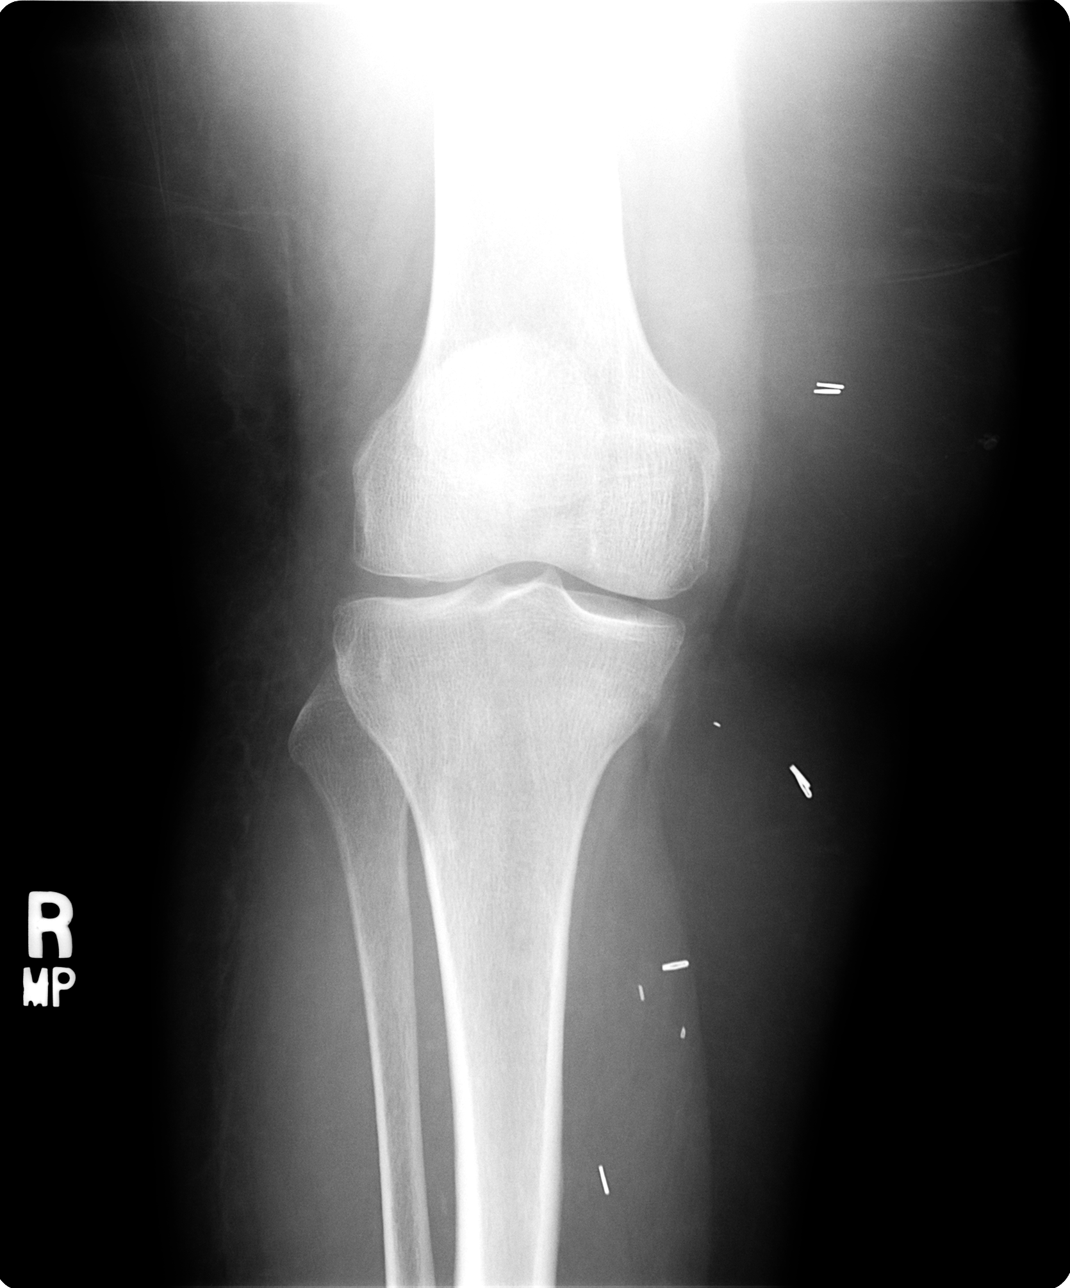

[view not recorded (2 of 4)]
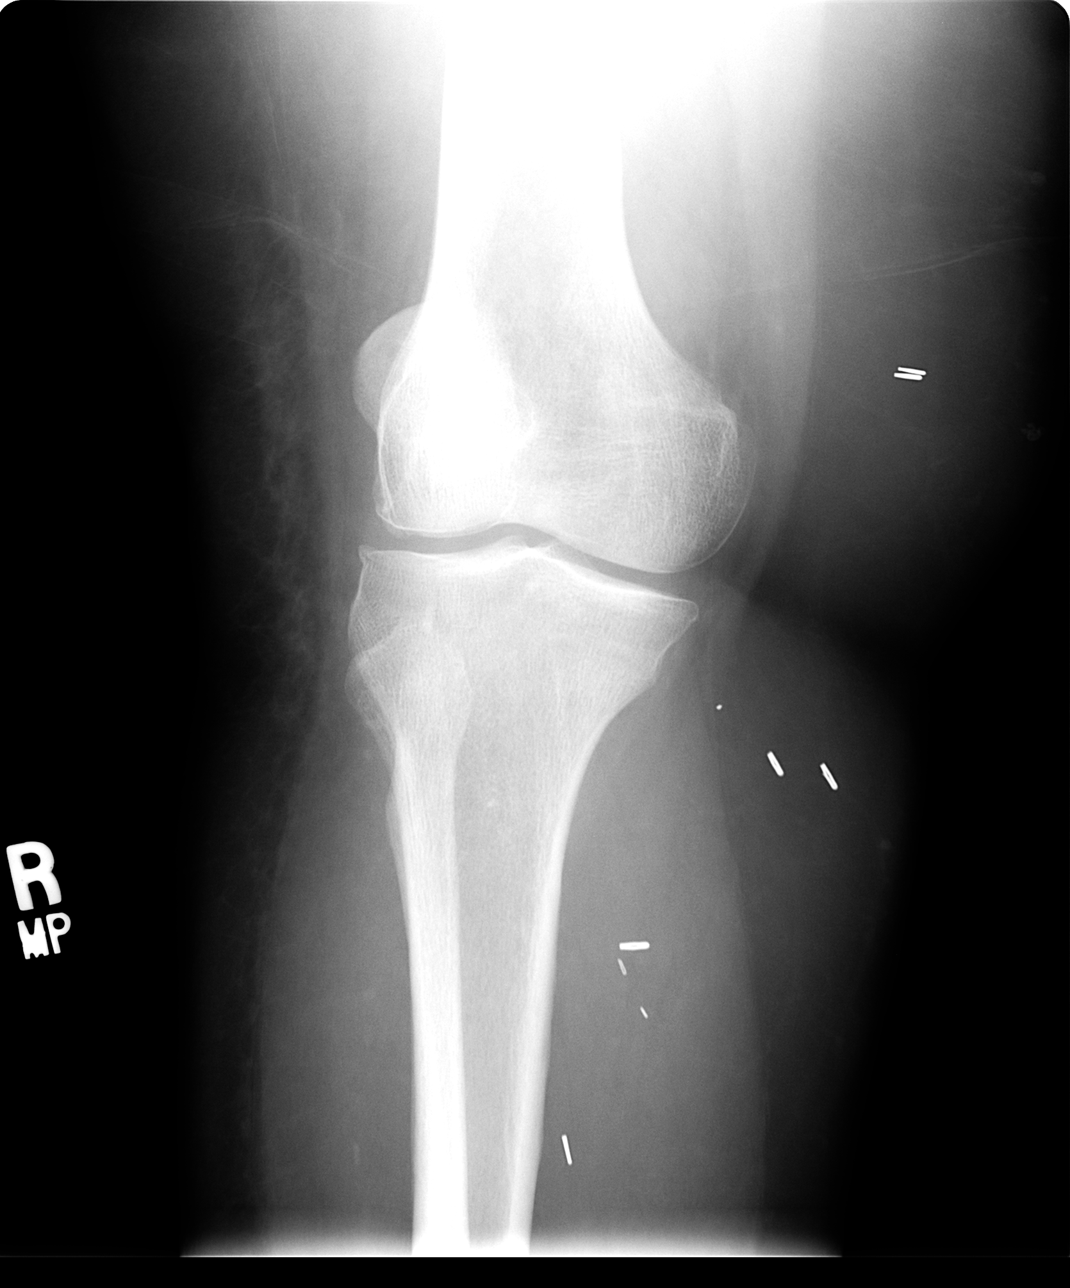

[view not recorded (3 of 4)]
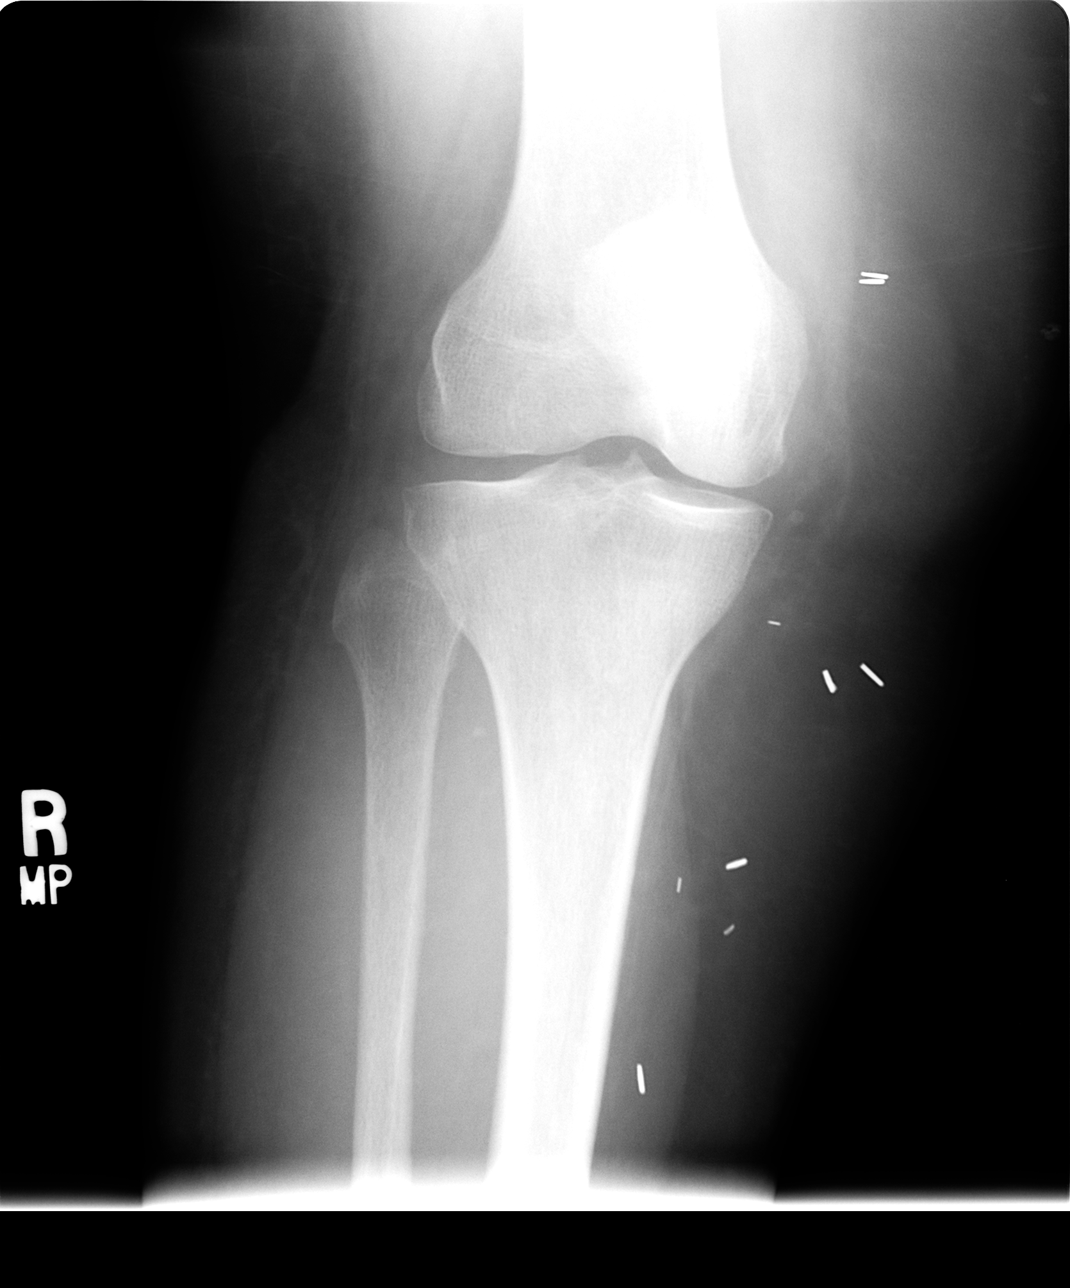

[view not recorded (4 of 4)]
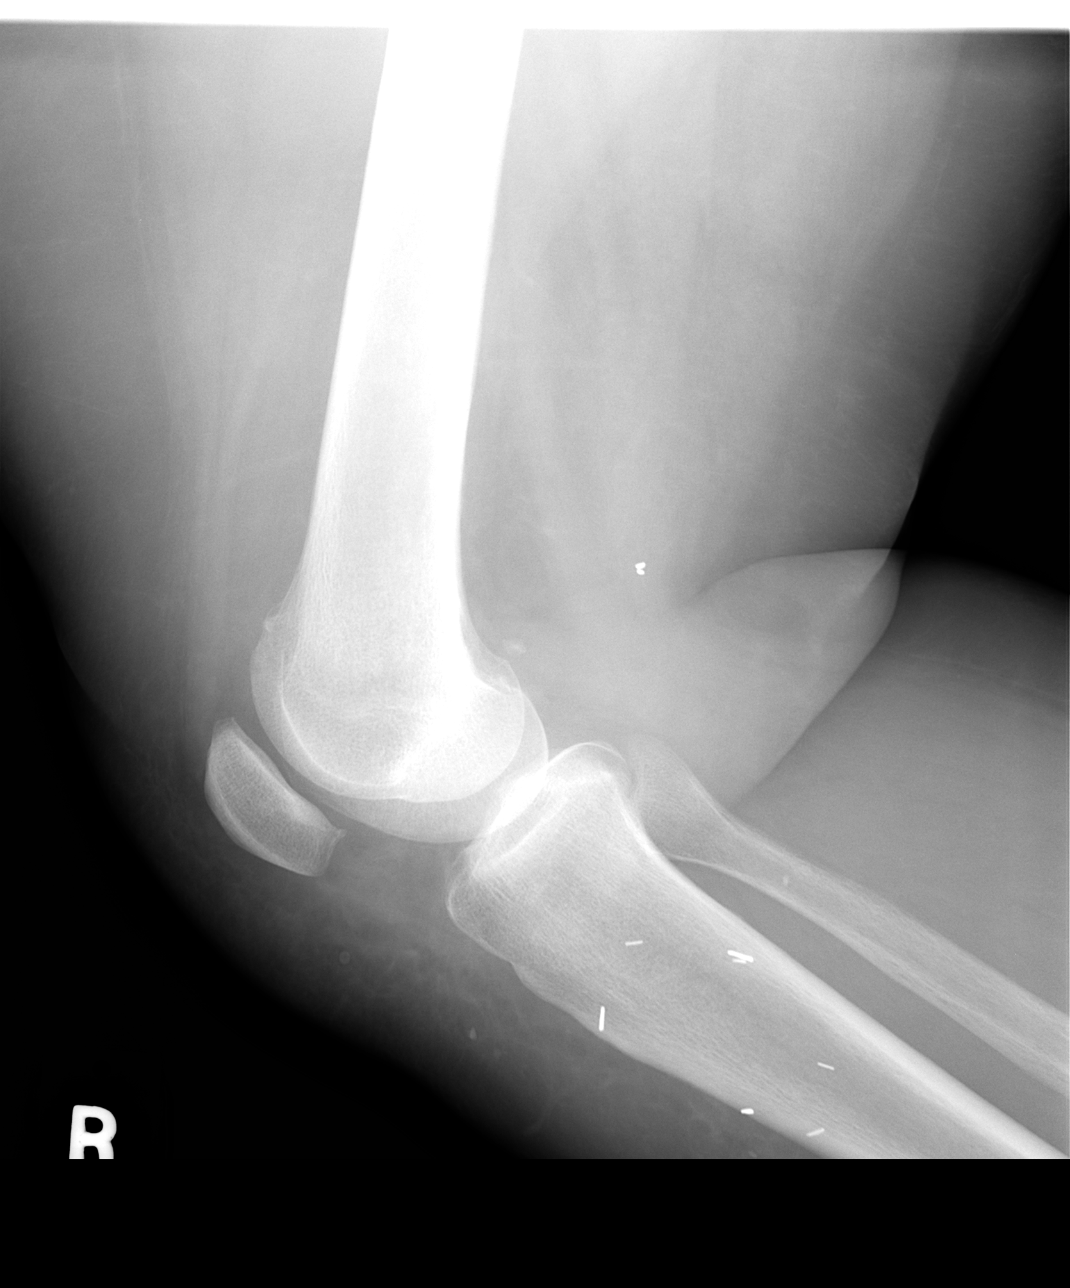

[4 of 4 positions shown; findings below may reference images not displayed]

IMPRESSION: No acute findings.  There are vascular calcifications. 
 LEFT ANKLE ? THREE VIEW:
 Three views show no acute fracture or dislocation.  Soft tissues unremarkable except for vascular calcifications.
IMPRESSION: Vascular calcifications ? no acute bony abnormality. 
 LEFT KNEE ? FOUR VIEW:
 Four views show mild tricompartmental degenerative changes.  No joint effusion, fracture, or dislocation.
IMPRESSION: Mild degenerative changes.  No acute abnormality.

## 2005-09-17 IMAGING — CR DG FOOT COMPLETE 3+V*L*
3 series · 3 of 3 positions shown · non-contrast
Comparison: none

CLINICAL DATA: Fell ? pain and bruising in the left foot, ankle, and knee. 
 LEFT FOOT ? THREE VIEW:
 Three views show no acute radiographic abnormality.  There are vascular calcifications and there is a prominent calcaneal spur.  A small accessory ossicle emanates off of the fifth metatarsal.

[view not recorded (1 of 3)]
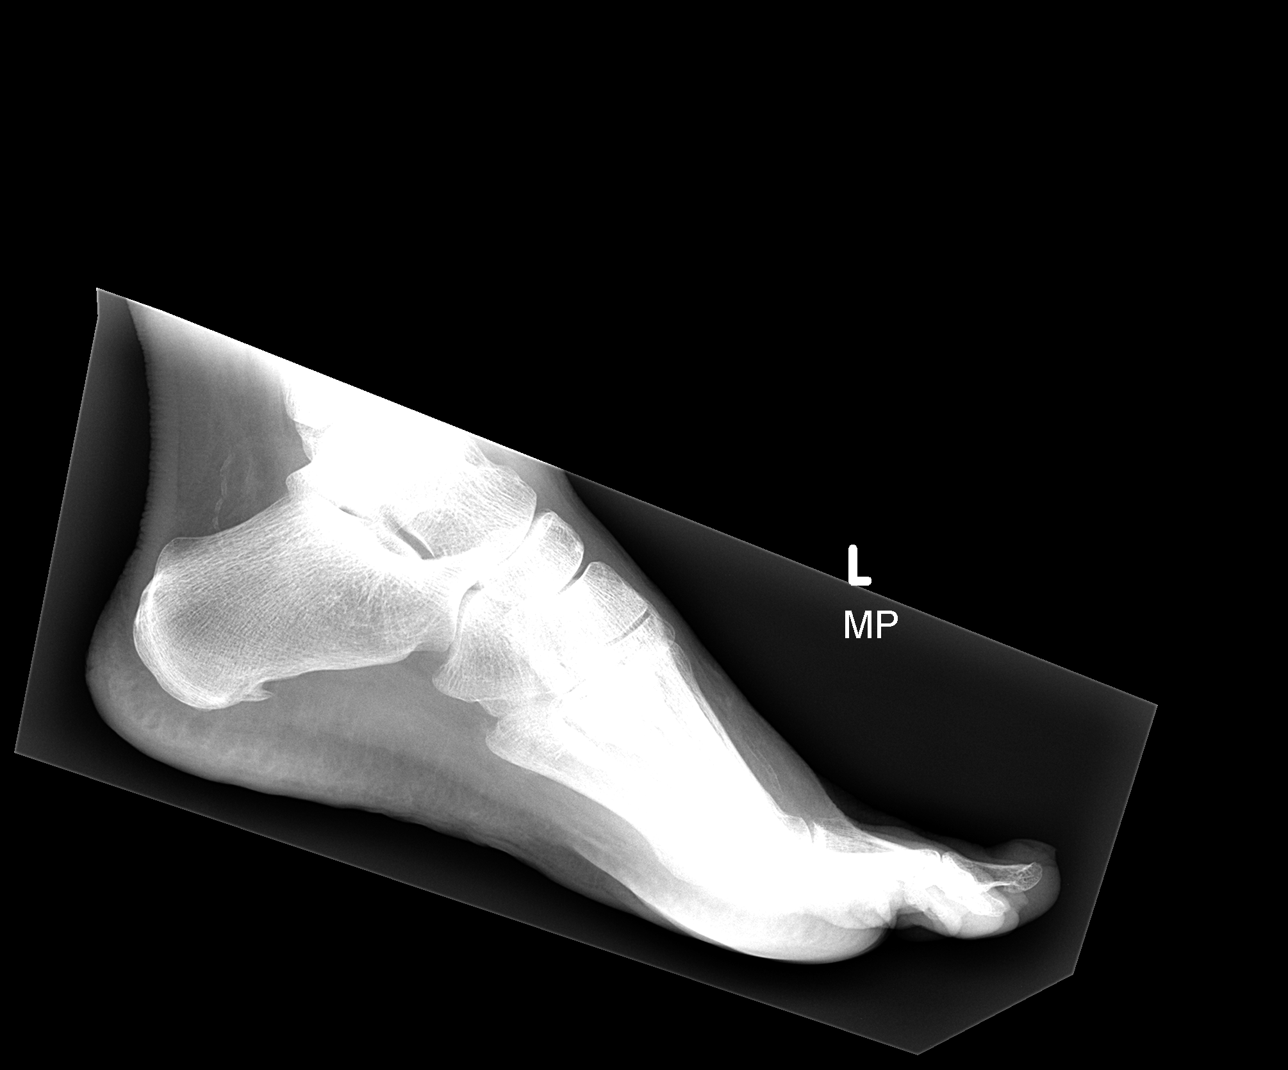

[view not recorded (2 of 3)]
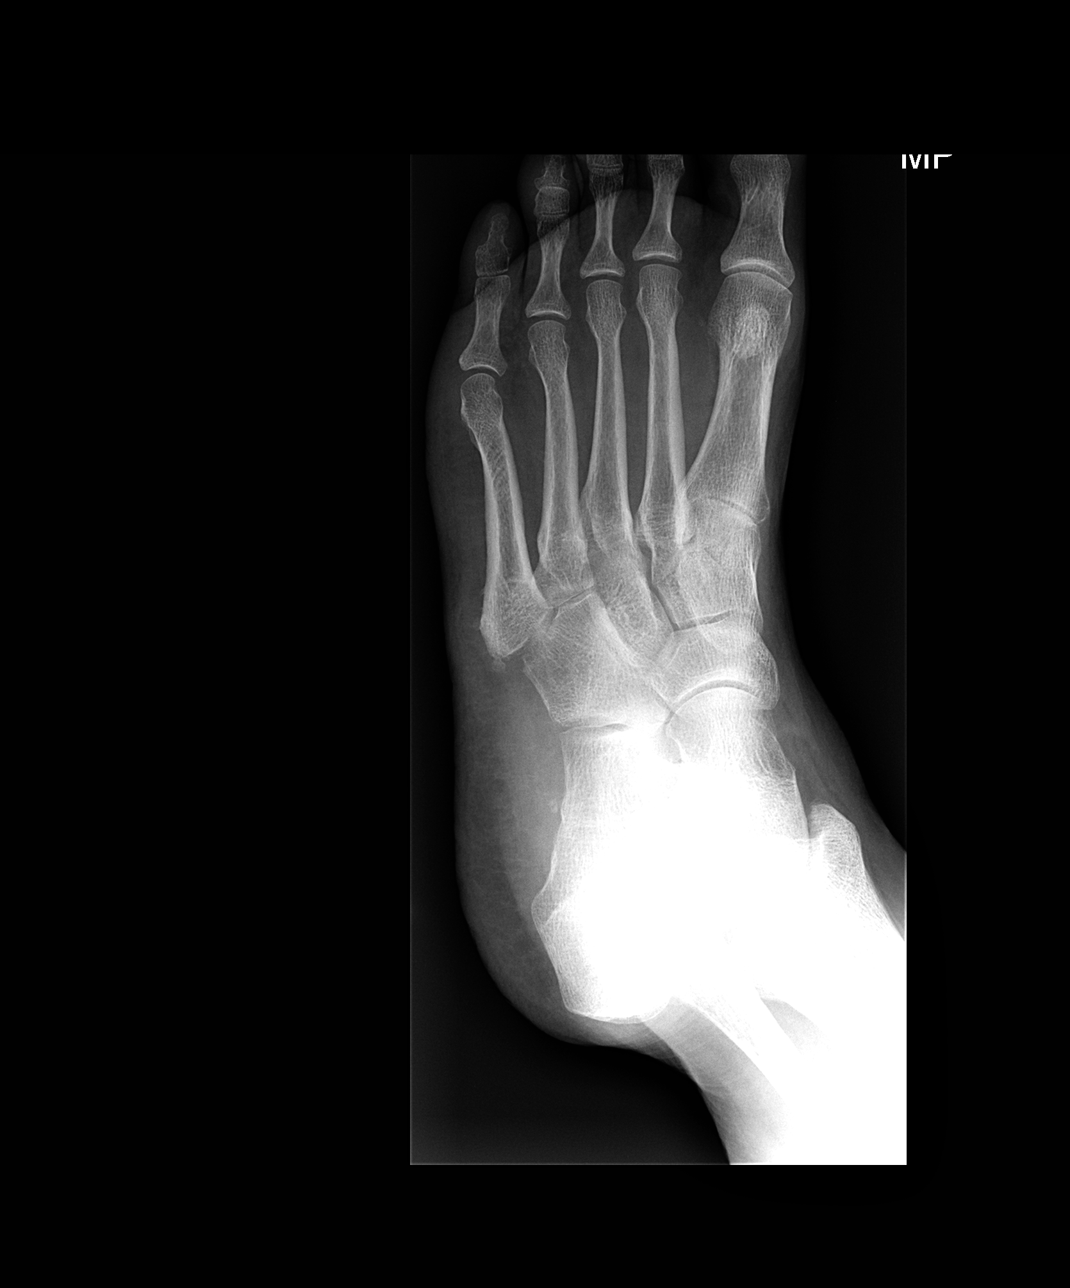

[view not recorded (3 of 3)]
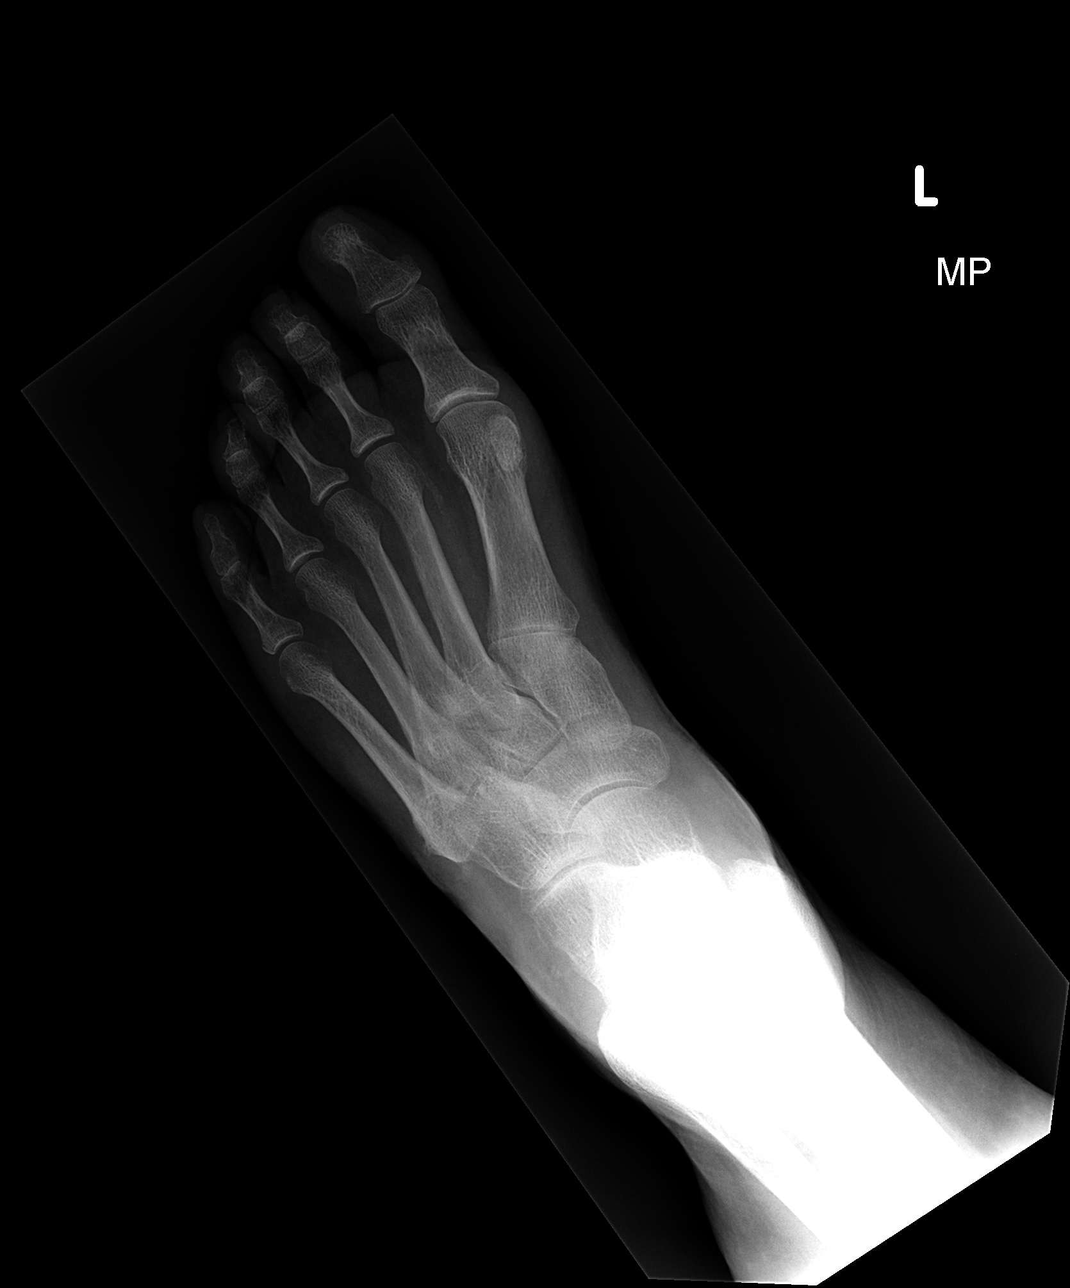

[3 of 3 positions shown; findings below may reference images not displayed]

IMPRESSION: No acute findings.  There are vascular calcifications. 
 LEFT ANKLE ? THREE VIEW:
 Three views show no acute fracture or dislocation.  Soft tissues unremarkable except for vascular calcifications.
IMPRESSION: Vascular calcifications ? no acute bony abnormality. 
 LEFT KNEE ? FOUR VIEW:
 Four views show mild tricompartmental degenerative changes.  No joint effusion, fracture, or dislocation.
IMPRESSION: Mild degenerative changes.  No acute abnormality.

## 2005-09-30 ENCOUNTER — Ambulatory Visit: Payer: Self-pay | Admitting: Internal Medicine

## 2005-10-22 ENCOUNTER — Ambulatory Visit: Payer: Self-pay | Admitting: Pulmonary Disease

## 2006-01-08 IMAGING — CR DG CHEST 2V
2 series · 2 of 2 positions shown · non-contrast
Comparison: none

CLINICAL DATA: Short of breath.  Chest pain and congestion. 
 CHEST X-RAY: 
 Two views of the chest are compared to a chest x-ray of 01/12/04 from [REDACTED].  Linear scarring in the left mid lung is stable.  Mild peribronchial thickening is noted consistent with bronchitis.  No pneumonia or effusion is seen.  Cardiomegaly is stable and median sternotomy sutures are noted from prior CABG.

[view not recorded (1 of 2)]
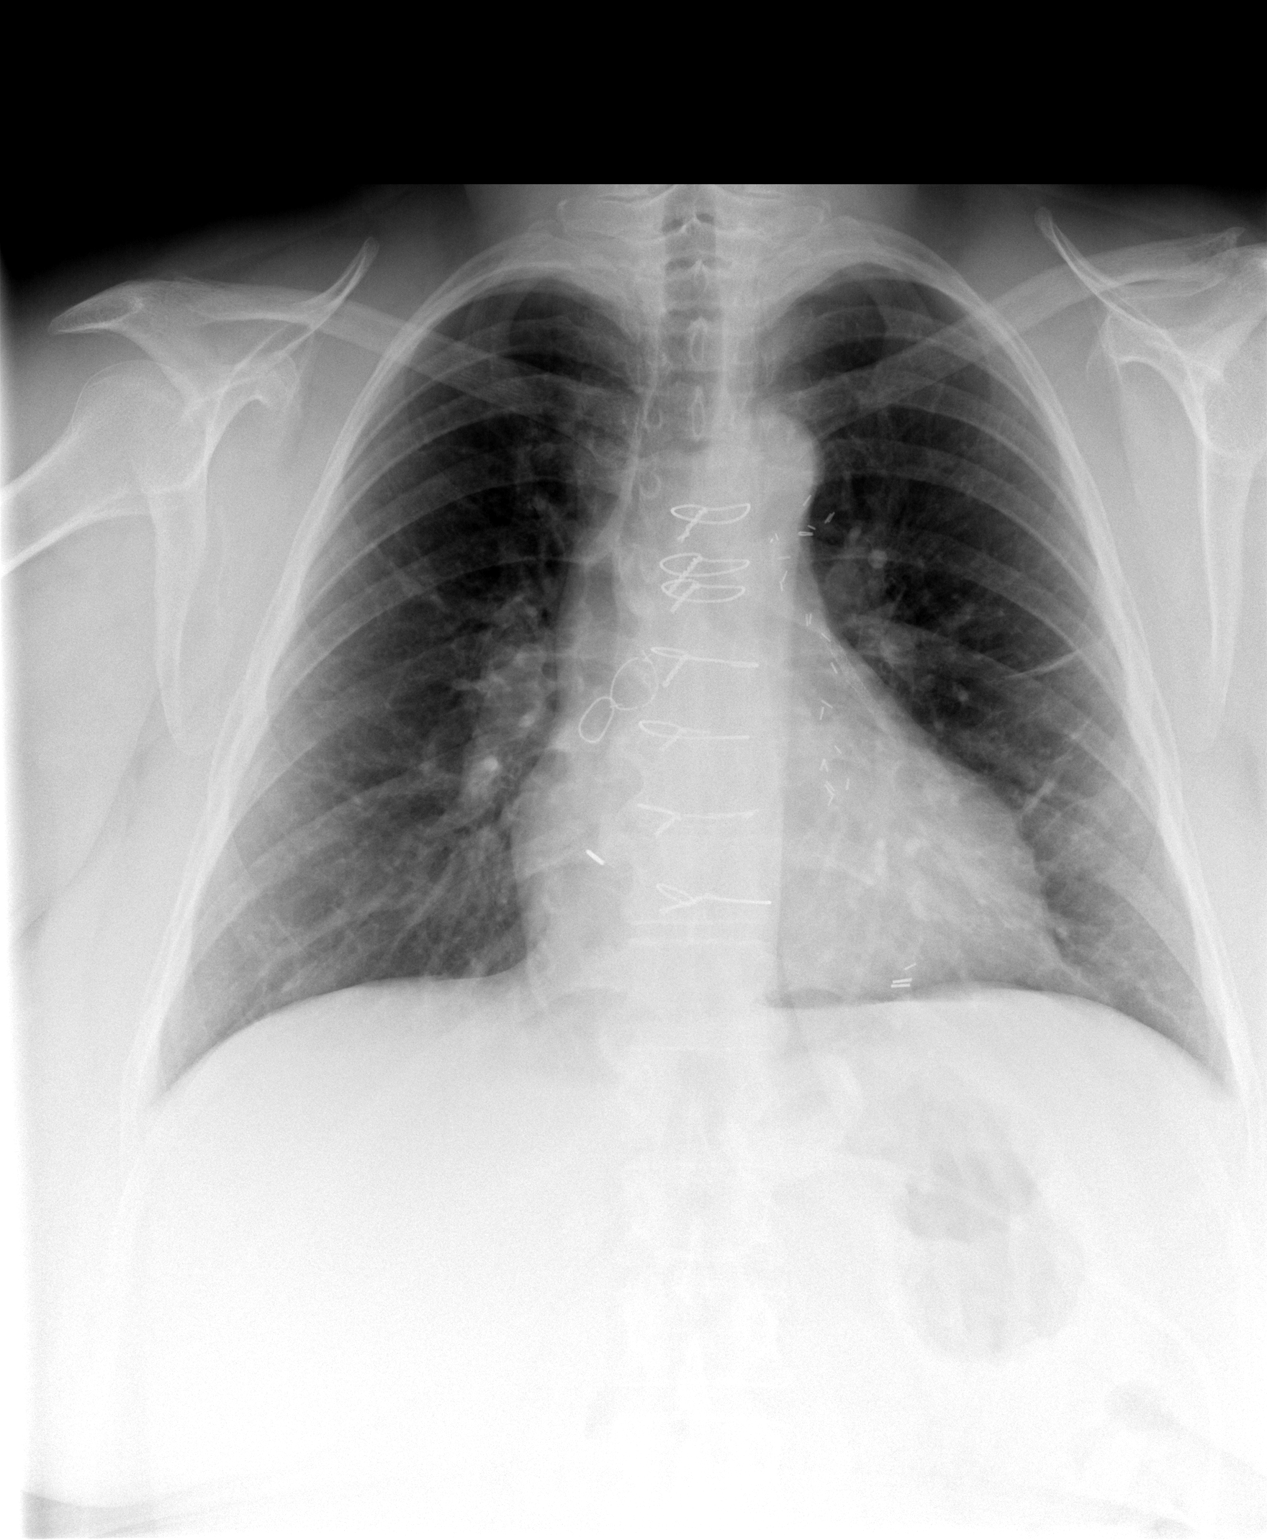

[view not recorded (2 of 2)]
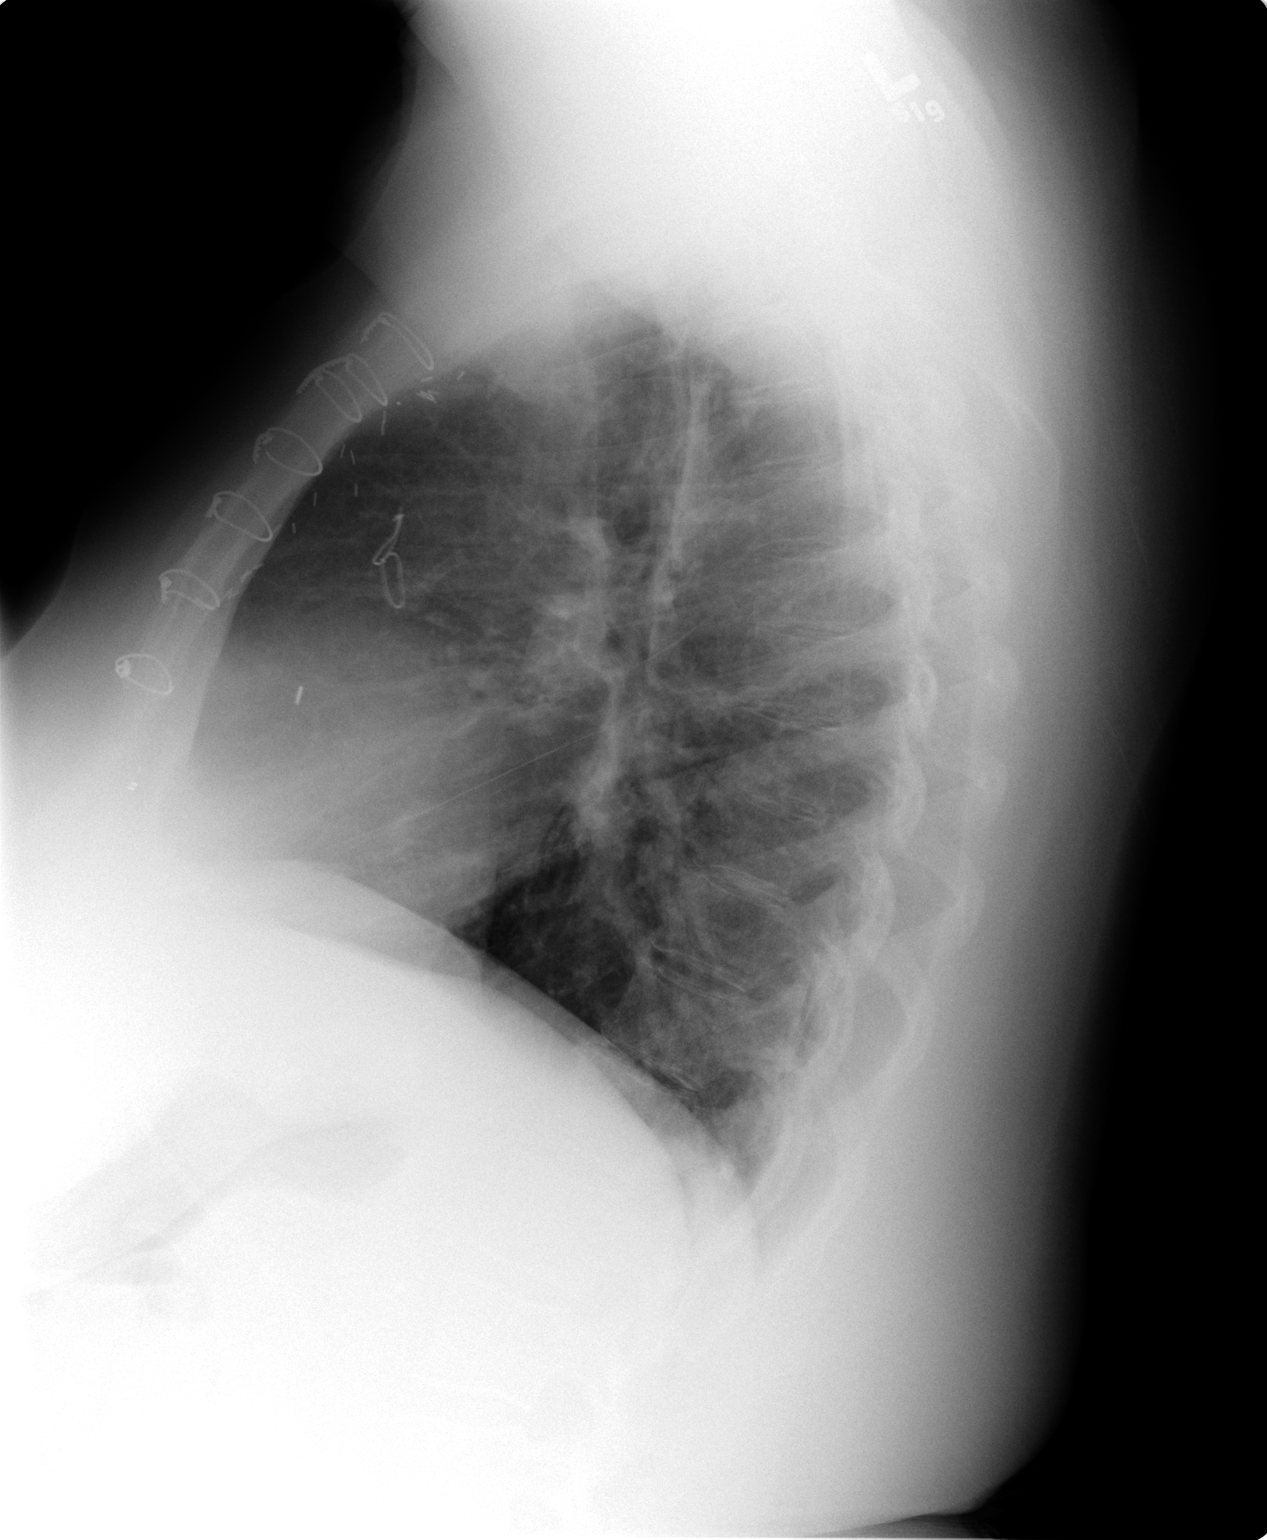

[2 of 2 positions shown; findings below may reference images not displayed]

IMPRESSION: Probable bronchitis.  No pneumonia.  Stable cardiomegaly.

## 2006-01-12 IMAGING — CT CT PARANASAL SINUSES LIMITED
1 series · 16 of 18 positions shown, 20 images · IV contrast (agent unspecified)
Comparison: 04/25/02.

CLINICAL DATA: Pressure, sinus drainage.  Recent mold exposure.  History of chronic sinusitis.
 CT SINUSES, LIMITED, WITHOUT CONTRAST:
TECHNIQUE: With the patient in the extended prone position, 3 mm cuts were obtained through the paranasal sinuses although these cuts are not contiguous.

[Series 3: ltdsinus 3.0 h30s · axial · 0.29mm/px · z∈[+1247,+1339]mm · 16 of 18 slices shown, 20 images]
[im 2/18  brain]
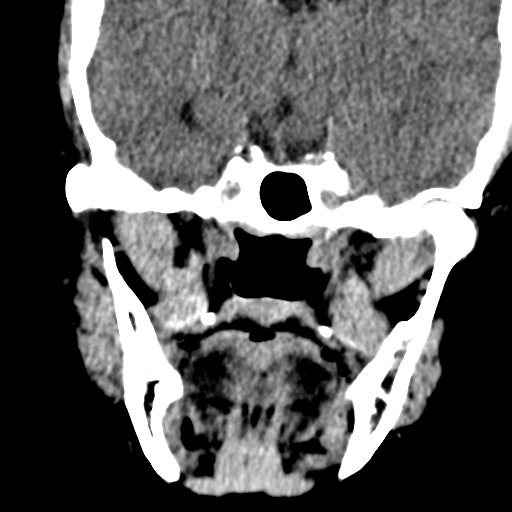
[im 2/18  bone]
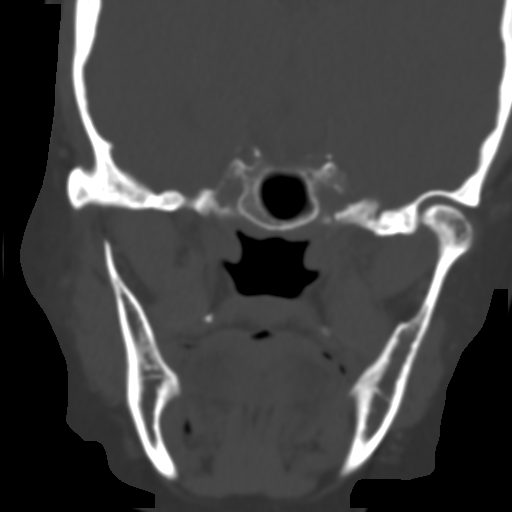
[im 3/18  bone]
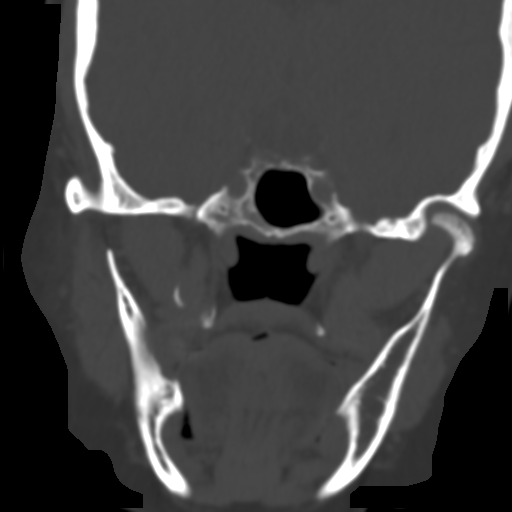
[im 4/18  bone]
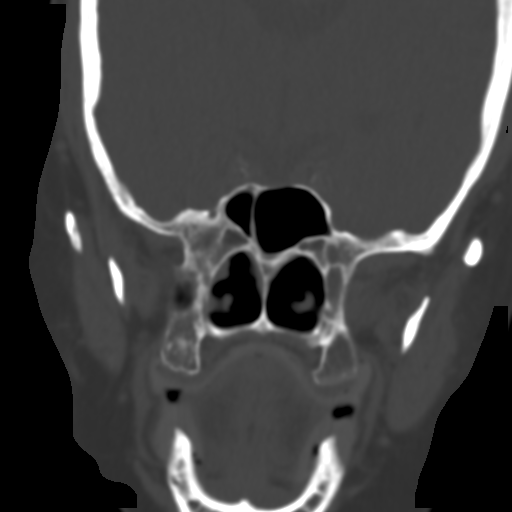
[im 5/18  bone]
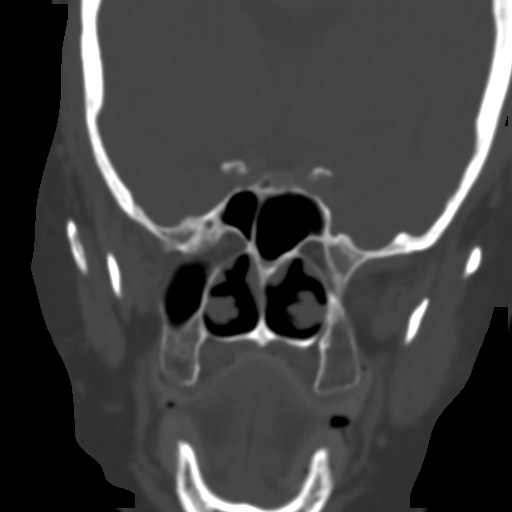
[im 6/18  brain]
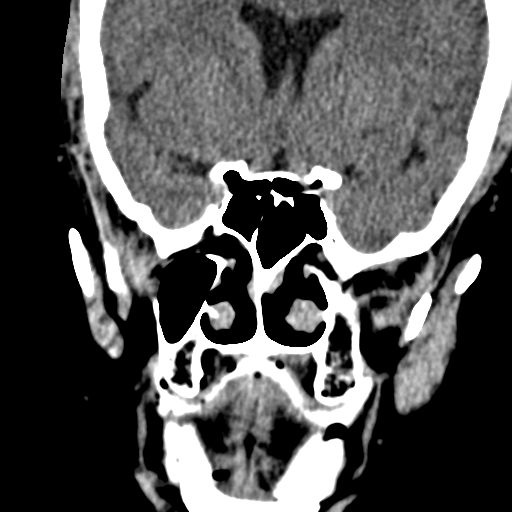
[im 6/18  bone]
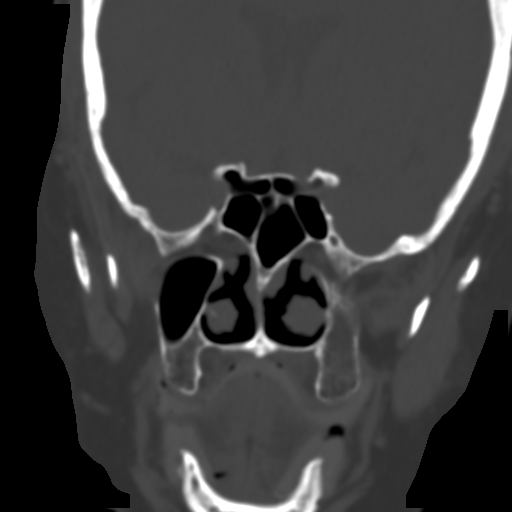
[im 7/18  bone]
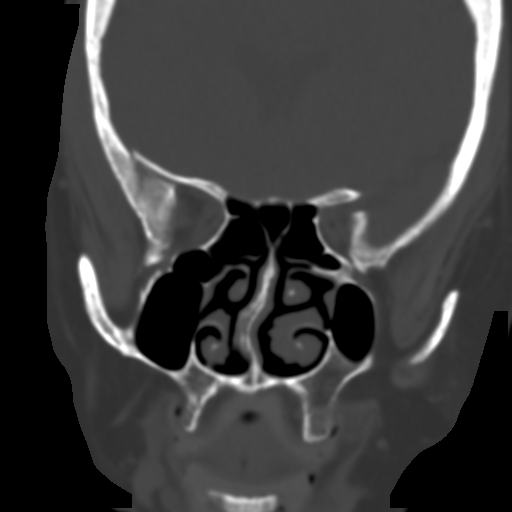
[im 8/18  bone]
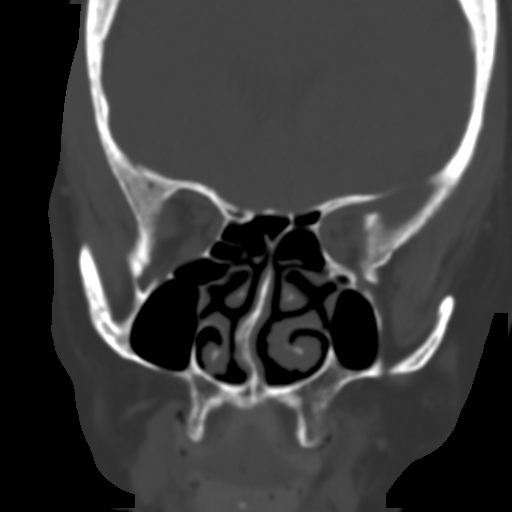
[im 9/18  bone]
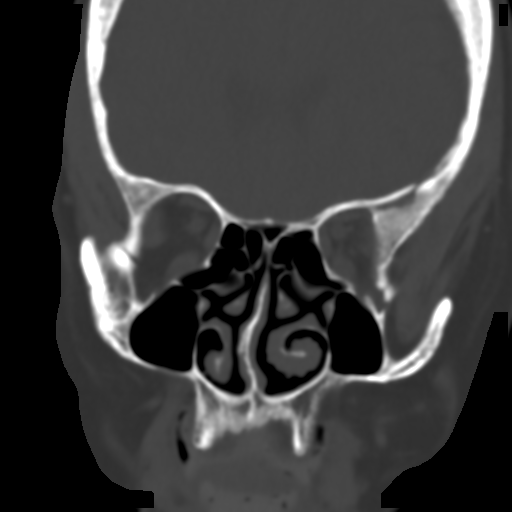
[im 10/18  brain]
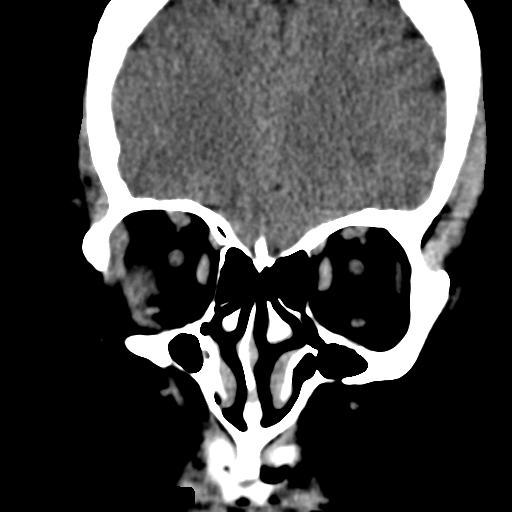
[im 10/18  bone]
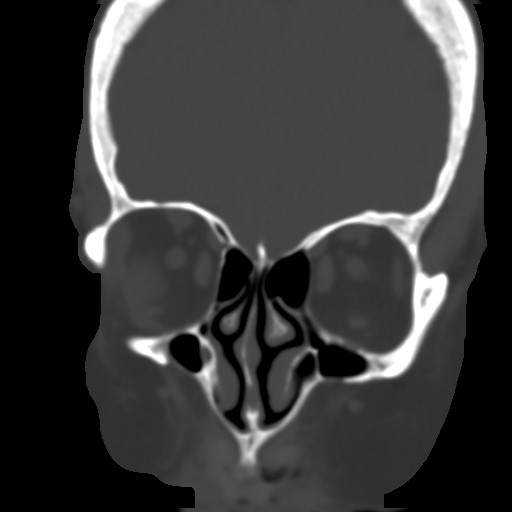
[im 11/18  bone]
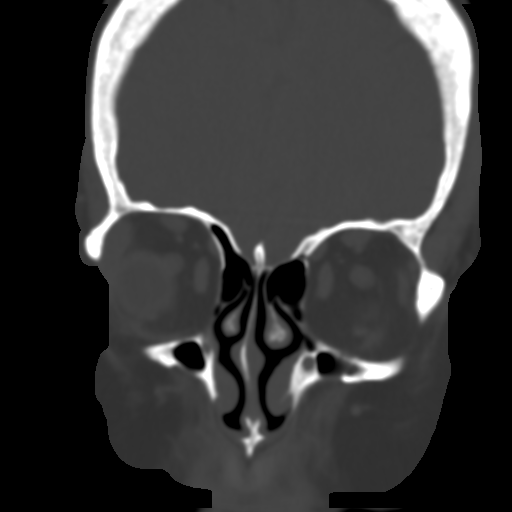
[im 12/18  bone]
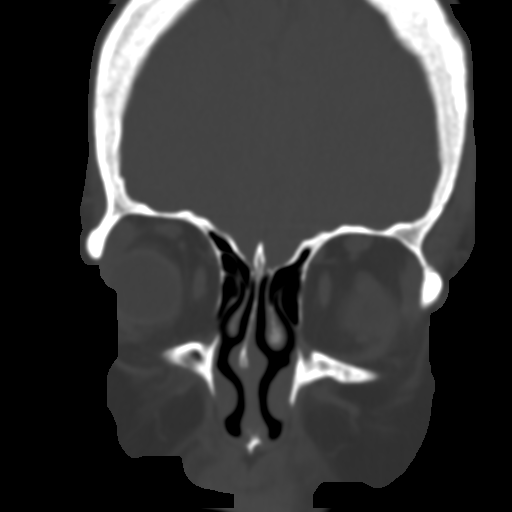
[im 13/18  bone]
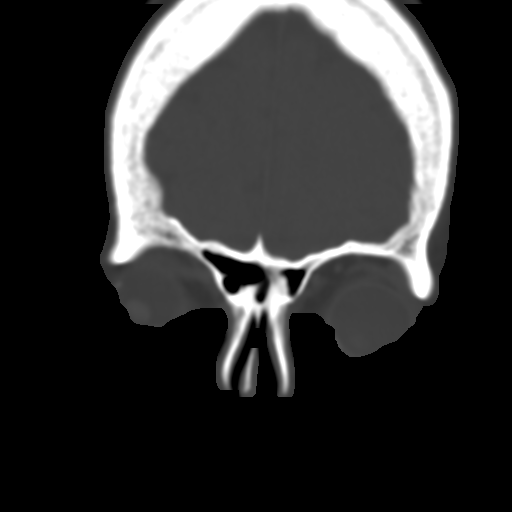
[im 14/18  brain]
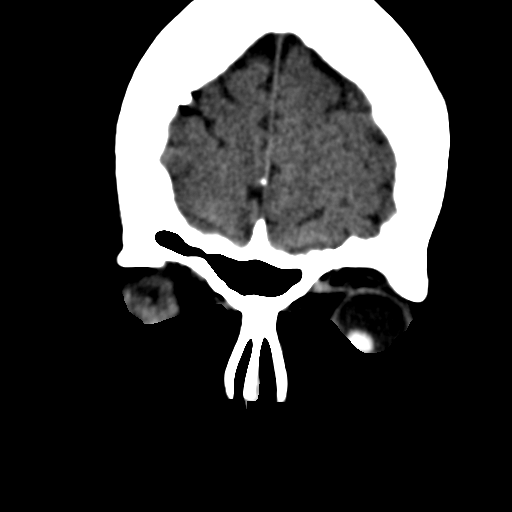
[im 14/18  bone]
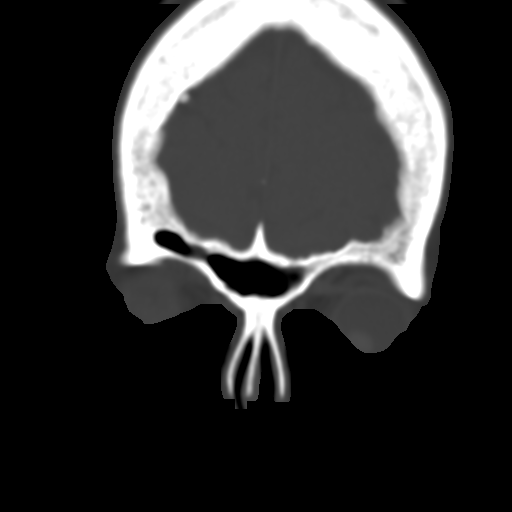
[im 15/18  bone]
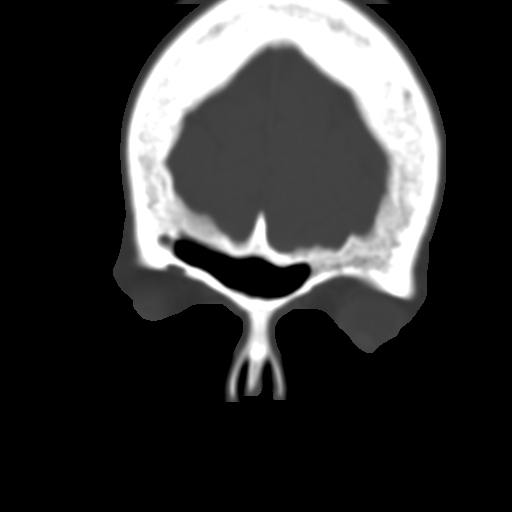
[im 16/18  bone]
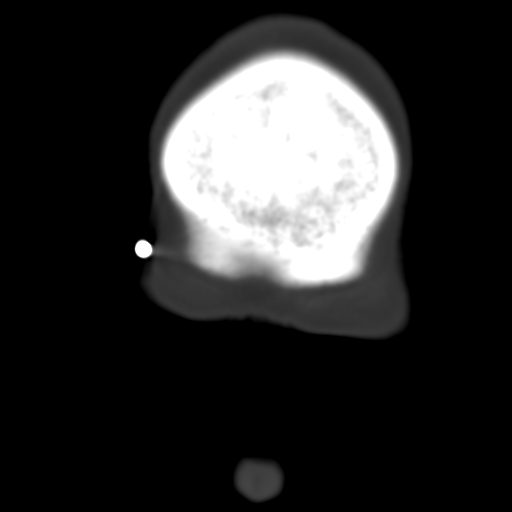
[im 17/18  bone]
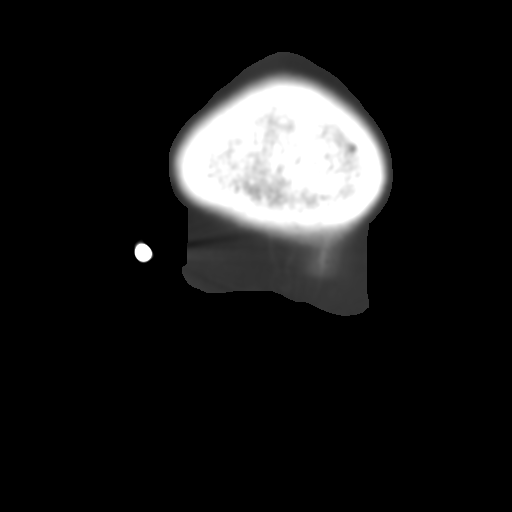

[16 of 18 positions shown; findings below may reference images not displayed]

FINDINGS: The sphenoid sinuses are normal.  Previously, they demonstrated chronic mucosal thickening.  No definite ethmoid, frontal, or maxillary sinusitis at this time.  
 The ostiomeatal anatomy cannot be fully evaluated on a limited study such as this.
IMPRESSION: At this time, there is no definite acute or chronic sinusitis ? chronic changes in the sphenoid sinuses appear to have resolved since the prior study.

## 2006-02-02 ENCOUNTER — Encounter (HOSPITAL_COMMUNITY): Admission: RE | Admit: 2006-02-02 | Discharge: 2006-05-03 | Payer: Self-pay | Admitting: Nephrology

## 2006-02-08 ENCOUNTER — Encounter: Admission: RE | Admit: 2006-02-08 | Discharge: 2006-02-08 | Payer: Self-pay | Admitting: Nephrology

## 2006-02-12 ENCOUNTER — Encounter: Admission: RE | Admit: 2006-02-12 | Discharge: 2006-02-12 | Payer: Self-pay | Admitting: Nephrology

## 2006-02-22 IMAGING — US US ABDOMEN COMPLETE
1 series · 14 of 25 positions shown · non-contrast
Comparison: none

CLINICAL DATA: Abdominal pain.  
ULTRASOUND ABDOMEN:
The gallbladder and common bile duct are normal.  The liver is mildly enlarged at 16.1 cm without focal lesion.  The pancreas and spleen are normal.  The IVC is normal.  Native kidneys are not seen due to atrophy  from chronic renal disease.  There is a transplant kidney in the left pelvic region.  This measures 11.6 cm.  There is mild dilatation of the collecting system.  However this has improved since the prior ultrasound of 03/03/2004.   No perinephric fluid collections are identified. The urinary bladder was empty.

[Series 1: abdomen · 0.33mm/px · 14 of 74 slices shown]
[im 1/74]
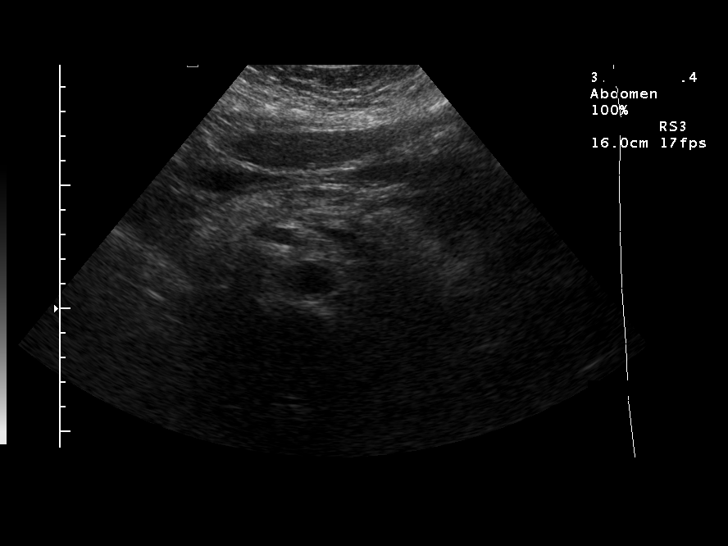
[im 7/74]
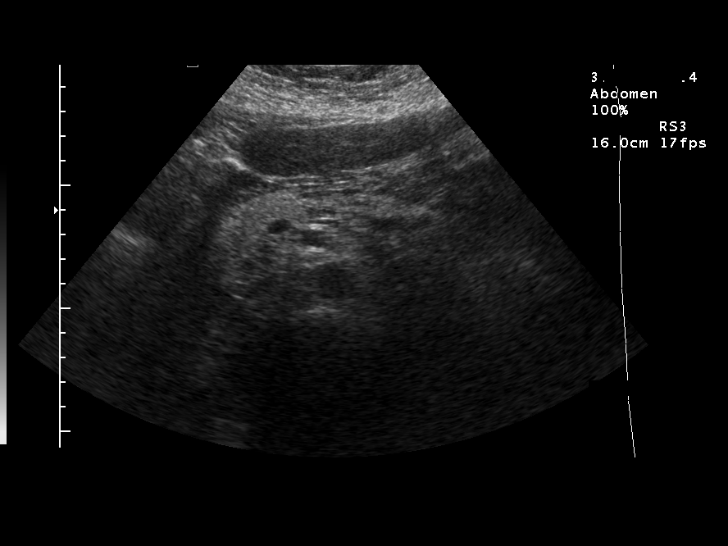
[im 13/74]
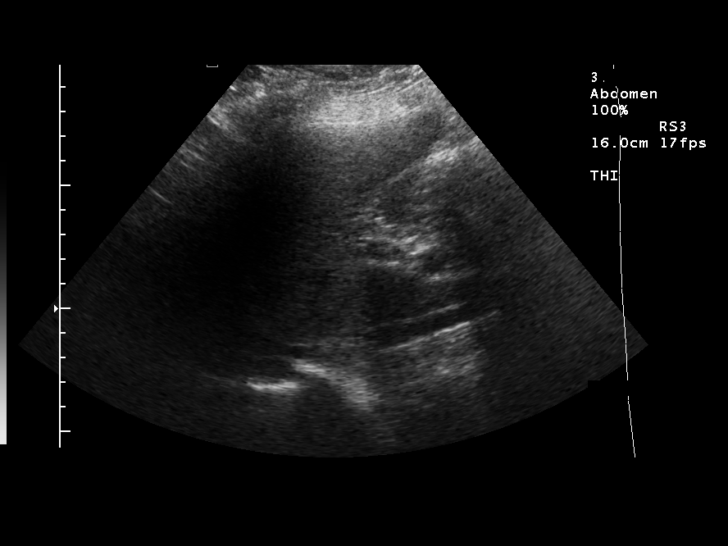
[im 19/74]
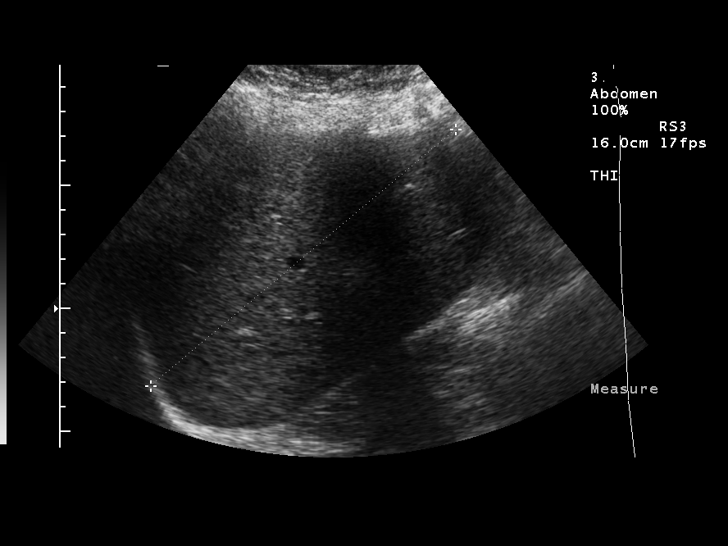
[im 25/74]
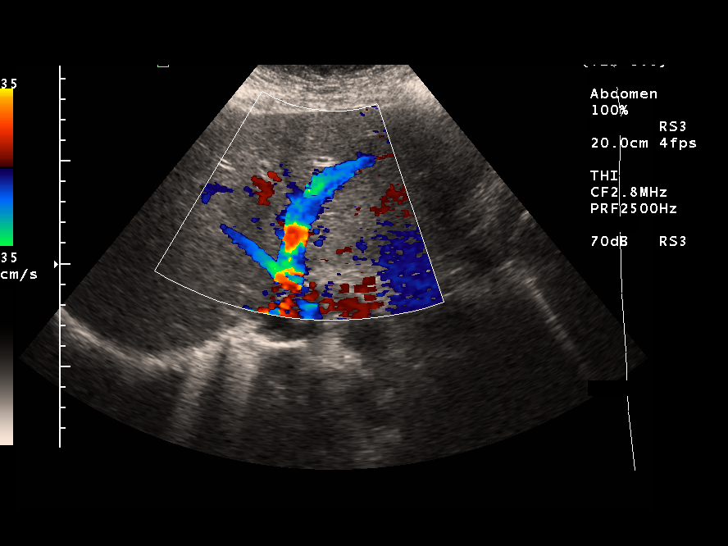
[im 28/74]
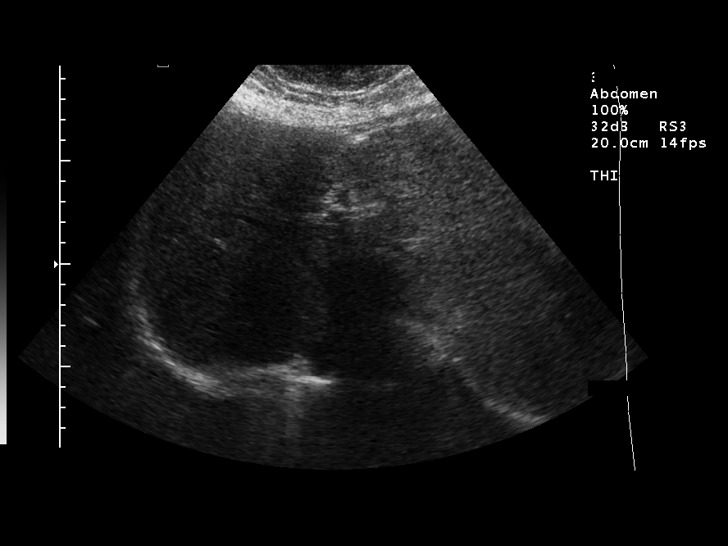
[im 34/74]
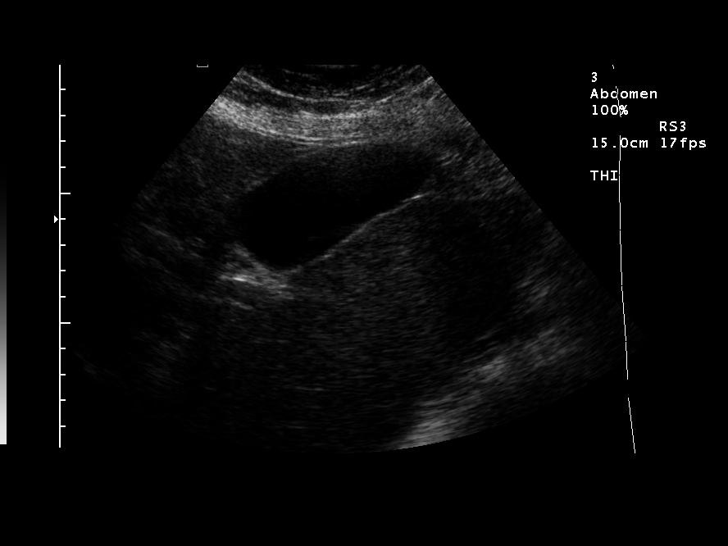
[im 40/74]
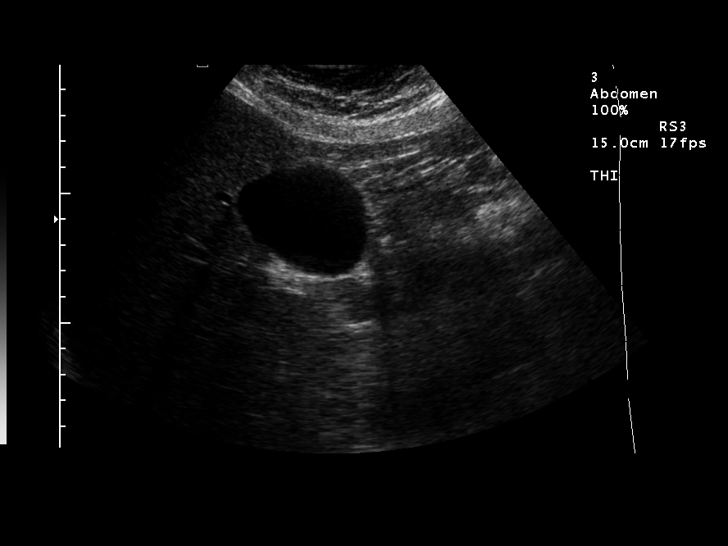
[im 46/74]
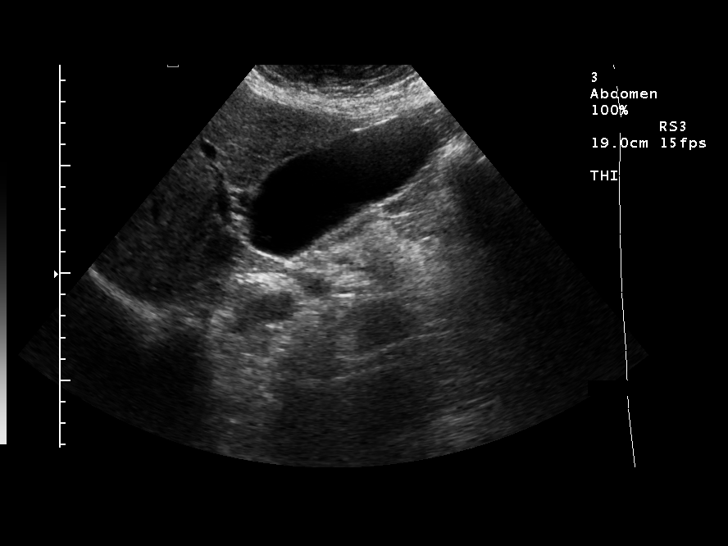
[im 49/74]
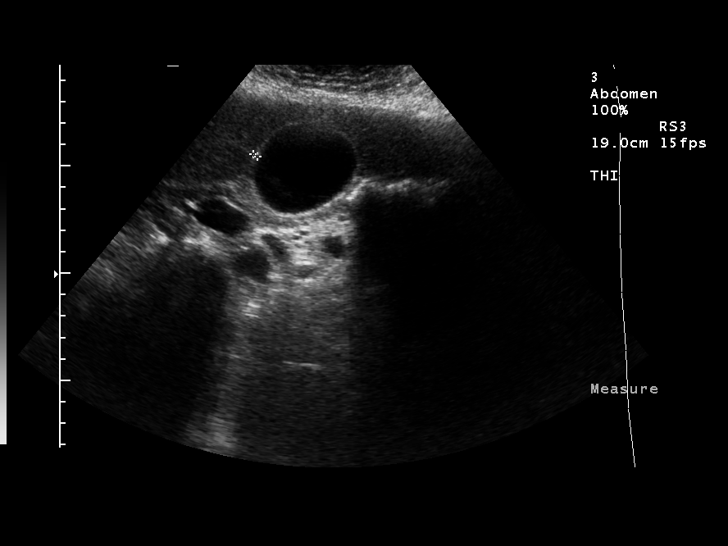
[im 55/74]
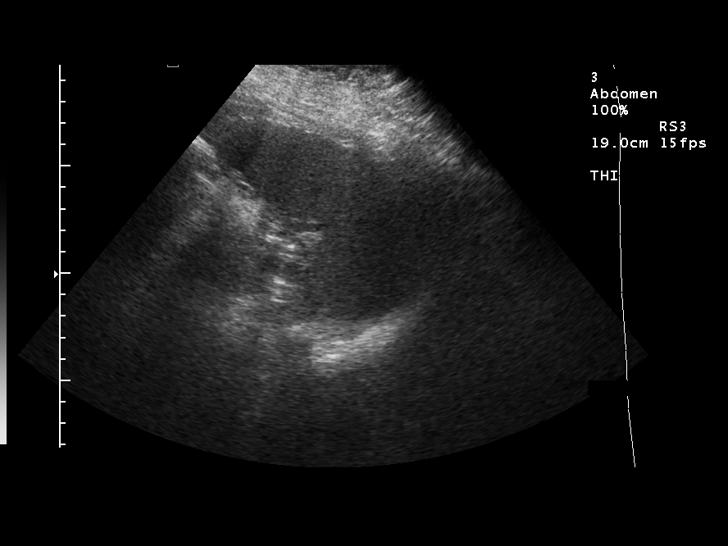
[im 61/74]
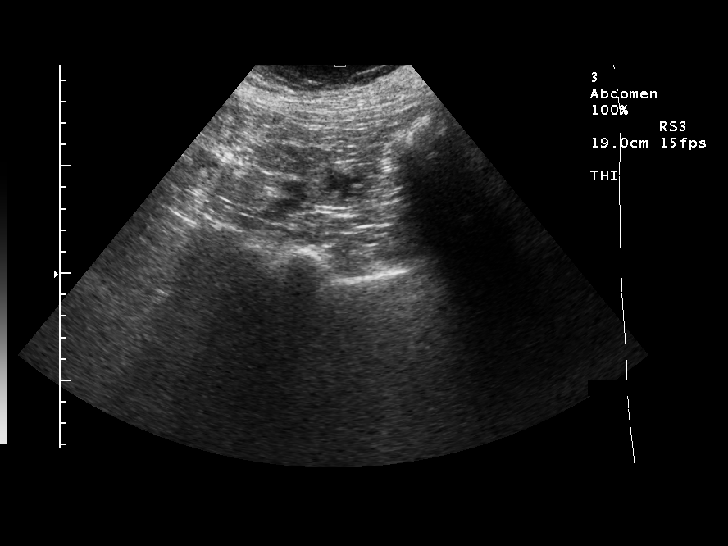
[im 67/74]
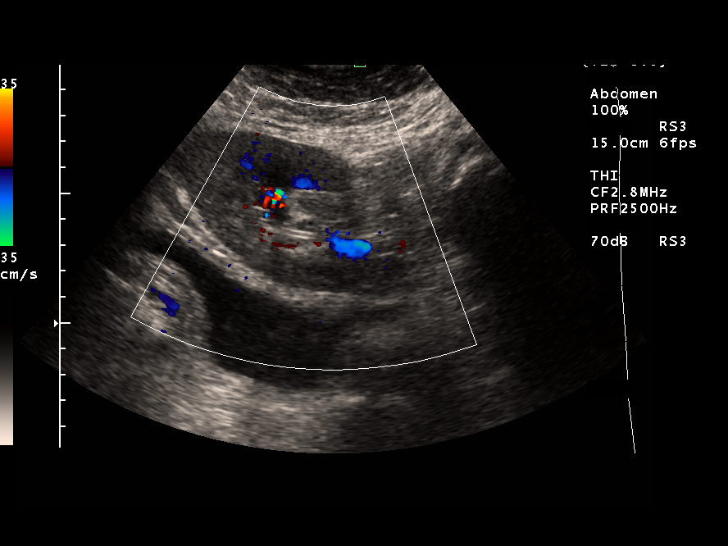
[im 74/74]
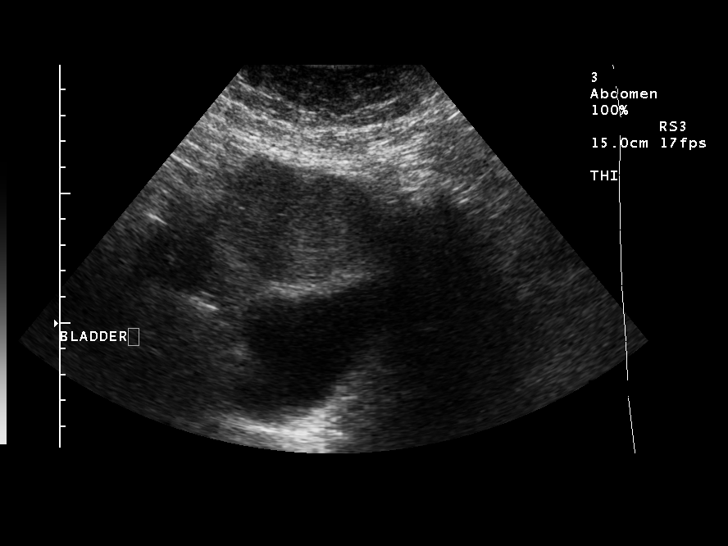

[14 of 25 positions shown; findings below may reference images not displayed]

IMPRESSION: 1.  Negative for gallstones.  
2.  Mild hydronephrosis of the transplant kidney with overall improvement since the prior ultrasound.

## 2006-02-22 IMAGING — CR DG CHEST 2V
2 series · 2 of 2 positions shown · non-contrast
Comparison: none

HISTORY: Fever, chest pain, nausea, vomiting

CHEST 2 VIEWS:
Comparison 12/25/2004
Upper normal heart size.
Status post CABG.
Mediastinal contours and vascularity normal.
Linear scarring left mid lung stable.
No acute failure or consolidation.
Bones unremarkable.
No effusion or pneumothorax.

[w chest pa]
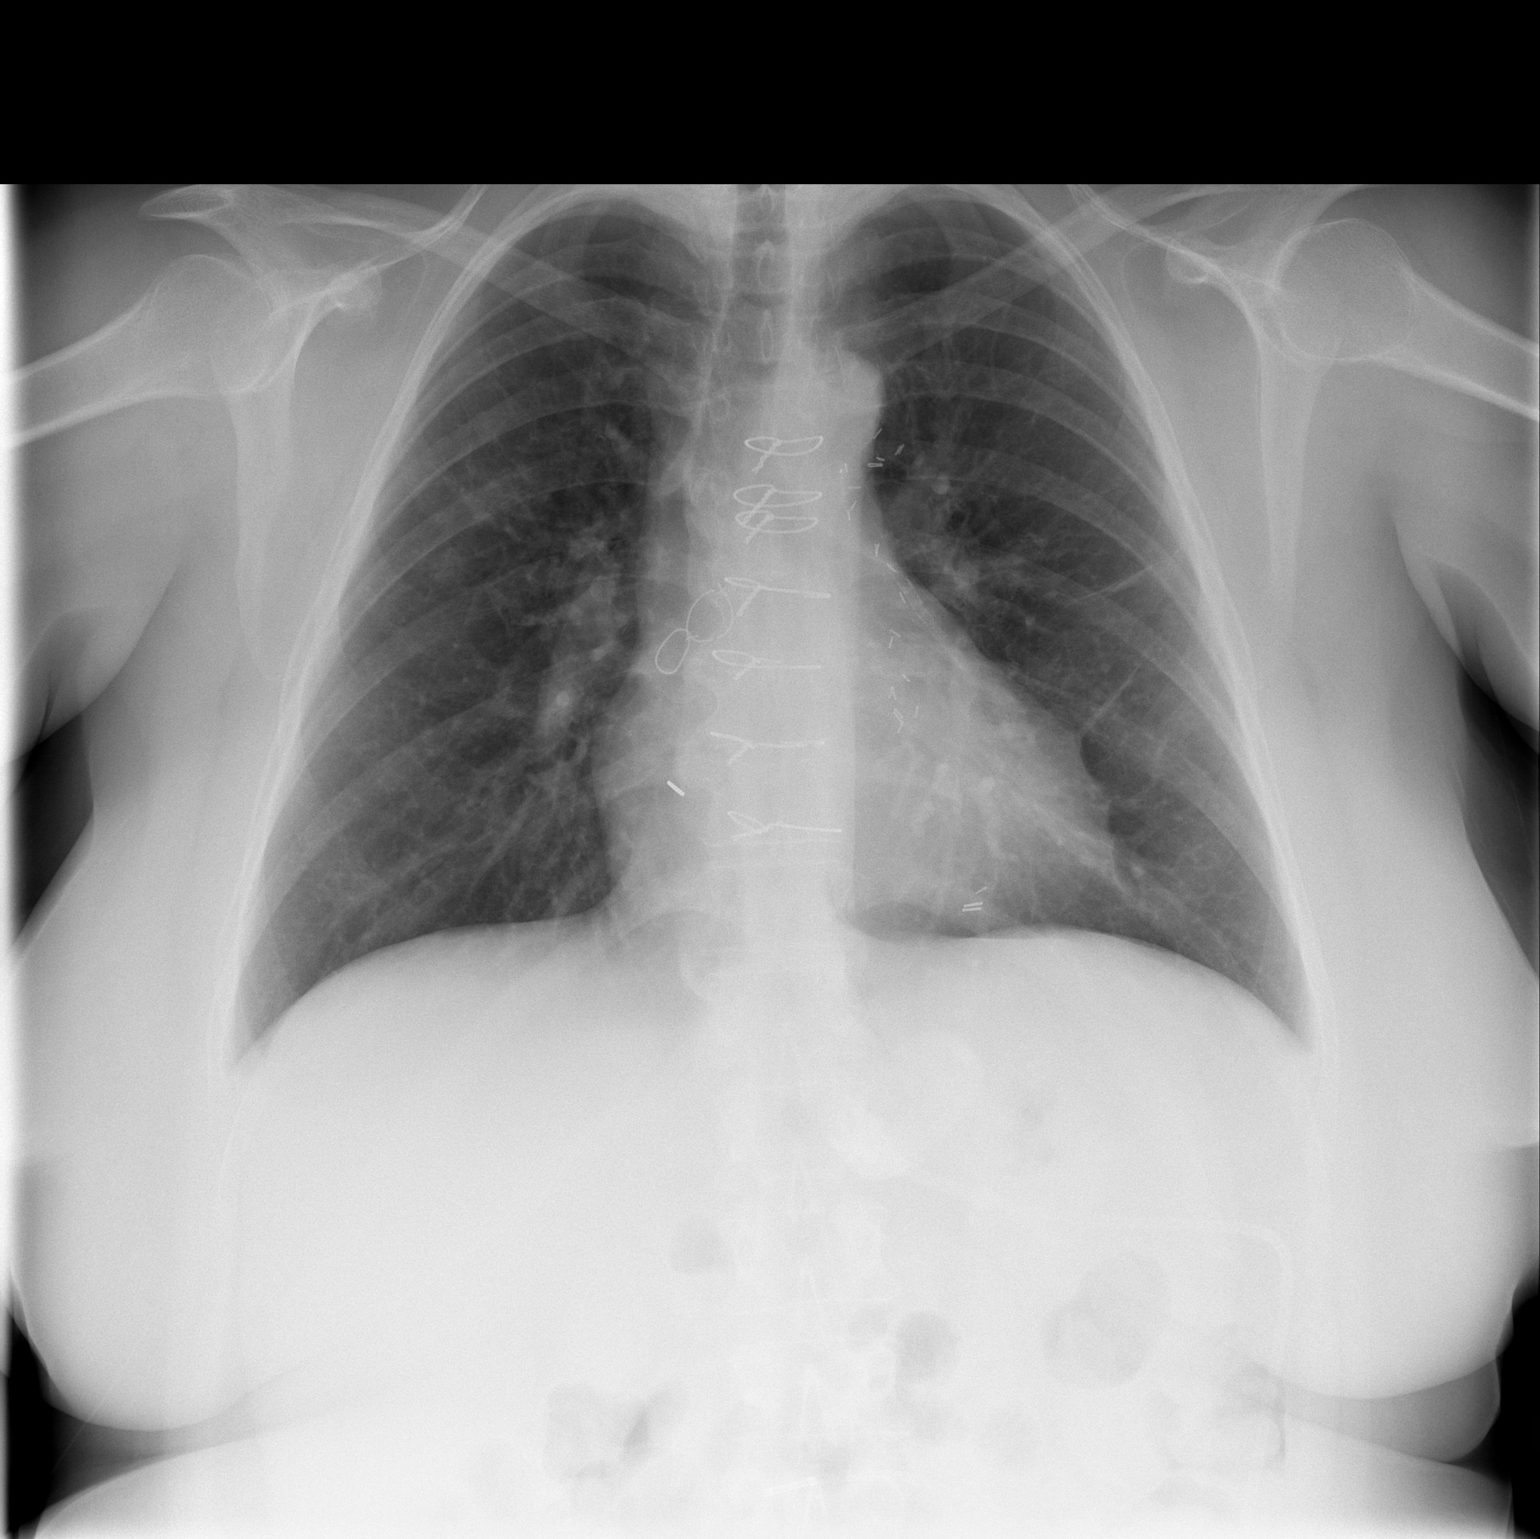

[w chest lat]
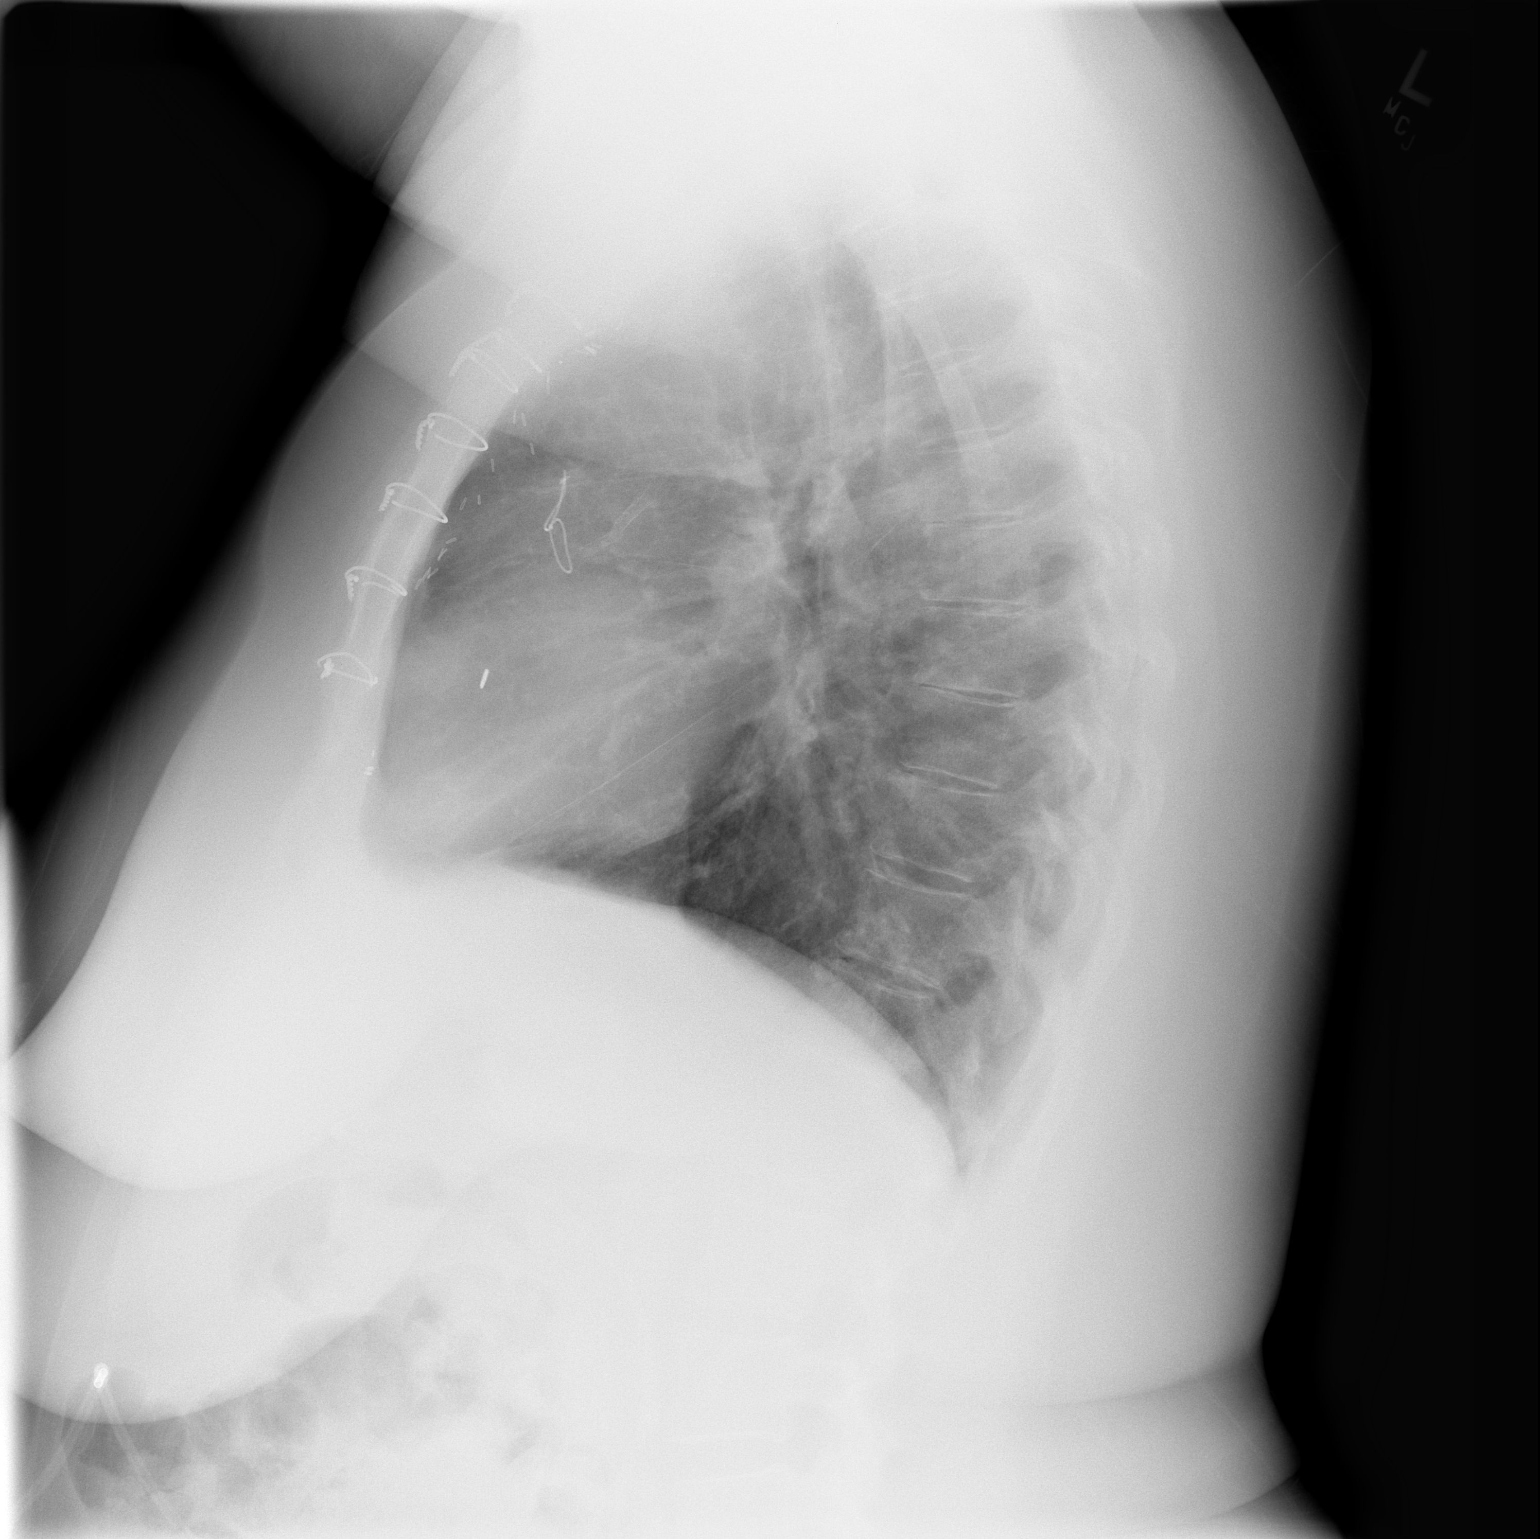

[2 of 2 positions shown; findings below may reference images not displayed]

IMPRESSION: CABG.
Stable scarring, left midlung.

## 2006-02-23 IMAGING — CT CT HEAD W/O CM
1 series · 15 of 28 positions shown, 19 images · IV contrast (agent unspecified)
Comparison: none

CLINICAL DATA: Fever, headache.  Assess for possible hemorrhage. 
 HEAD CT WITHOUT CONTRAST:
TECHNIQUE: 5 mm collimated images were obtained from the base of the skull through the vertex according to standard protocol without contrast.

[Series 2: brain · axial · 0.47mm/px · z∈[+153,+286]mm · 15 of 28 slices shown, 19 images]
[im 2/28  brain]
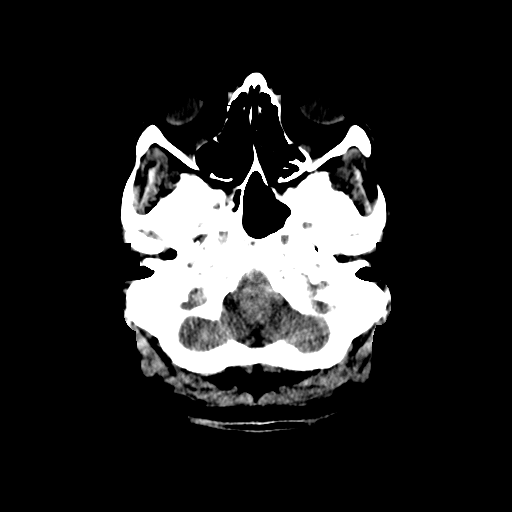
[im 2/28  bone]
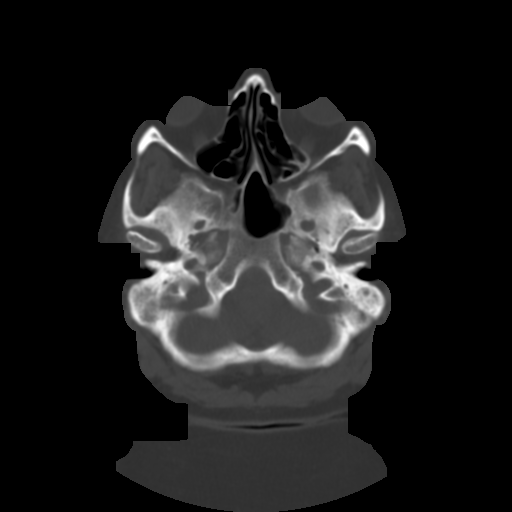
[im 4/28  brain]
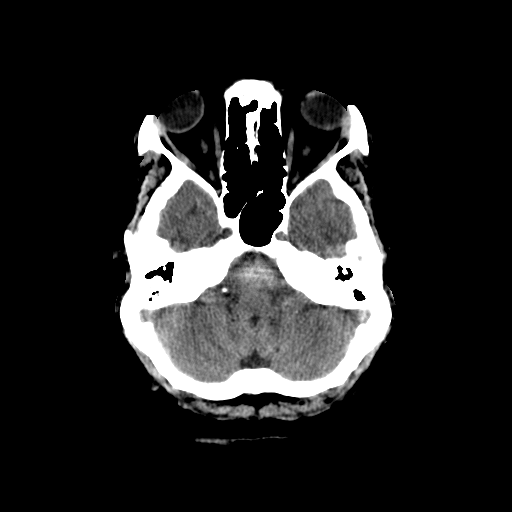
[im 6/28  brain]
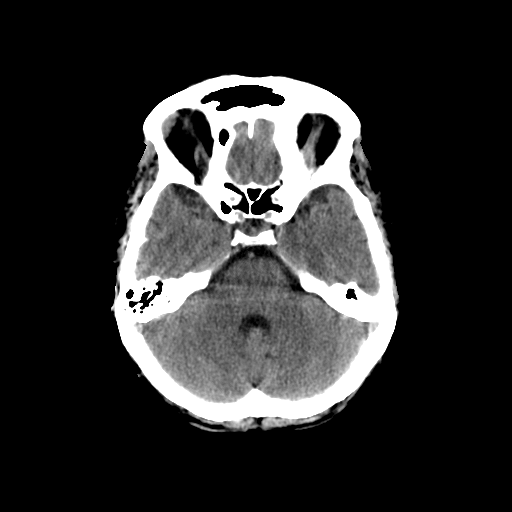
[im 8/28  brain]
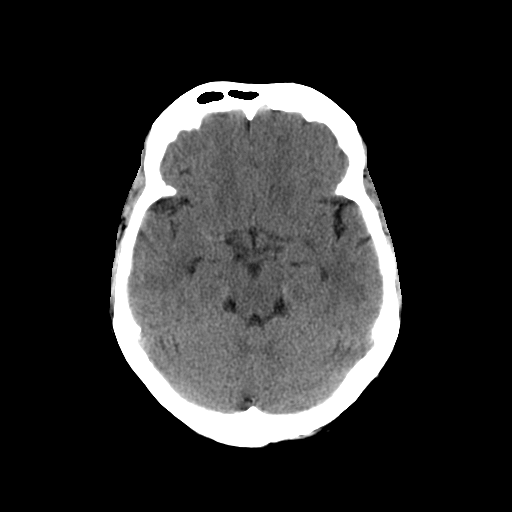
[im 9/28  brain]
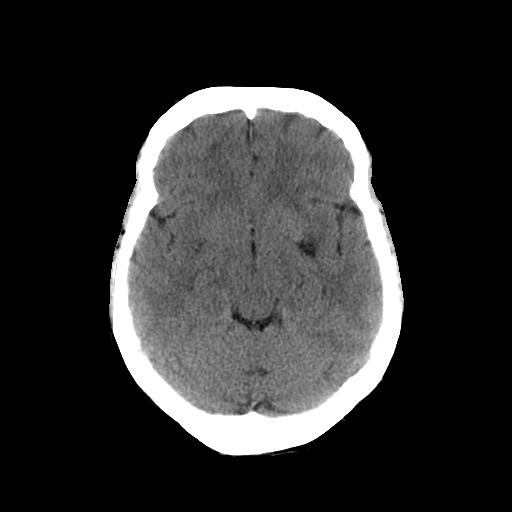
[im 9/28  bone]
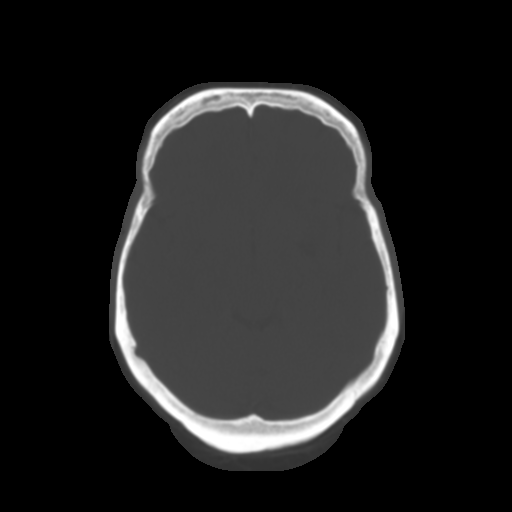
[im 11/28  brain]
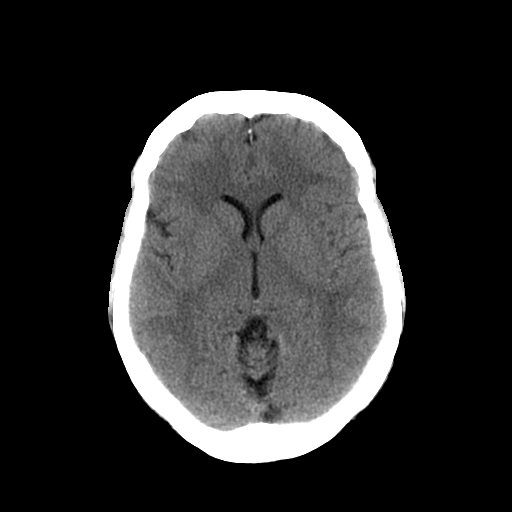
[im 13/28  brain]
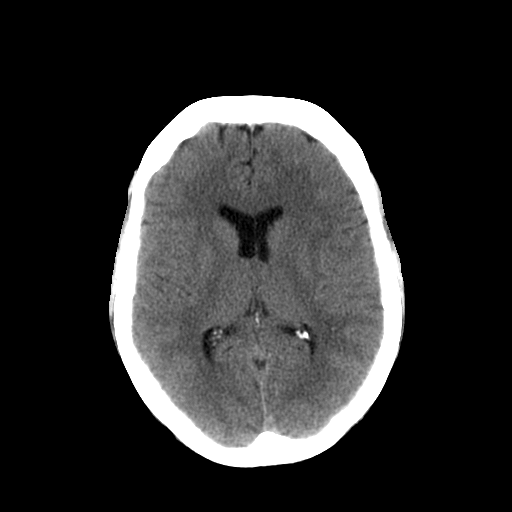
[im 15/28  brain]
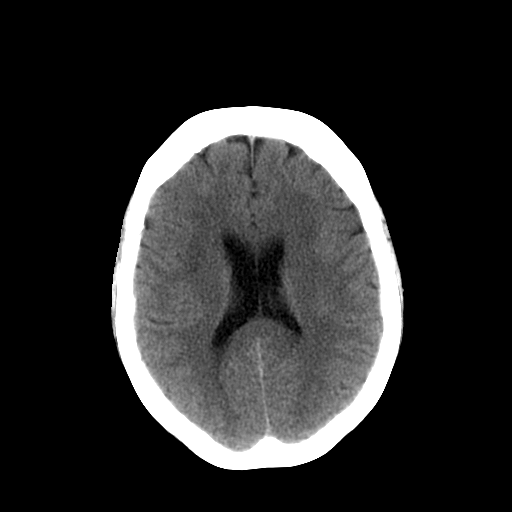
[im 16/28  brain]
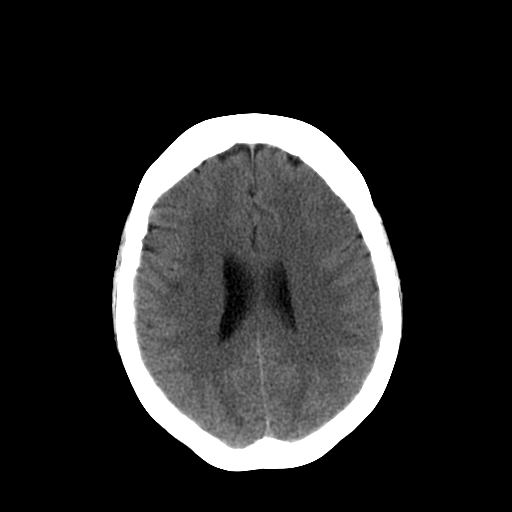
[im 16/28  bone]
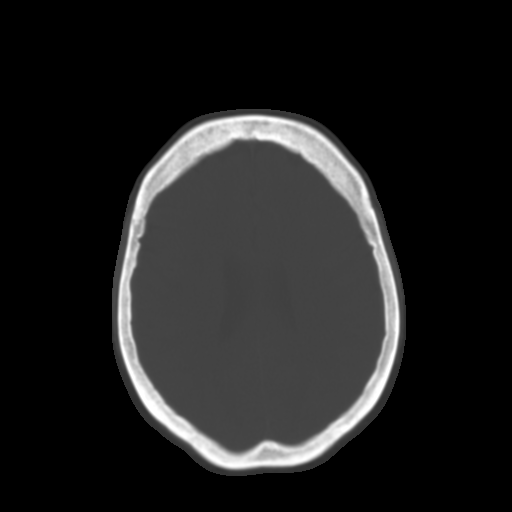
[im 18/28  brain]
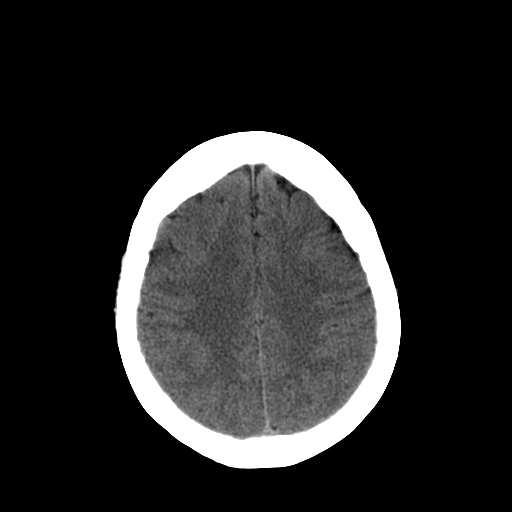
[im 20/28  brain]
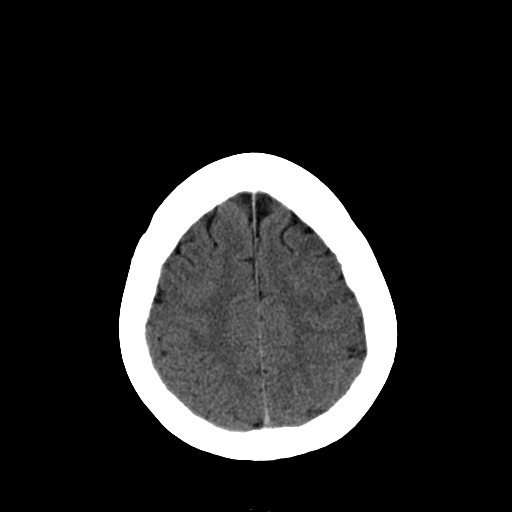
[im 21/28  brain]
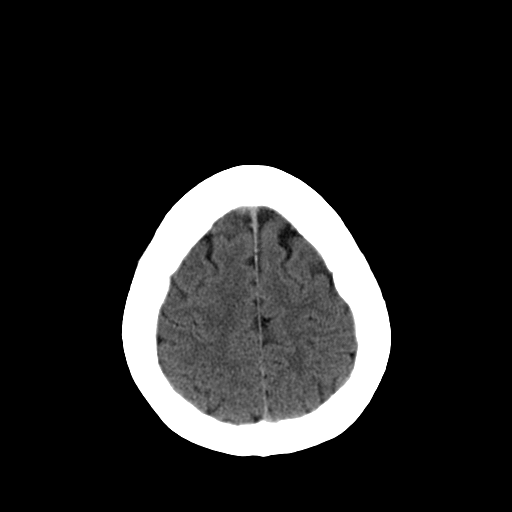
[im 23/28  brain]
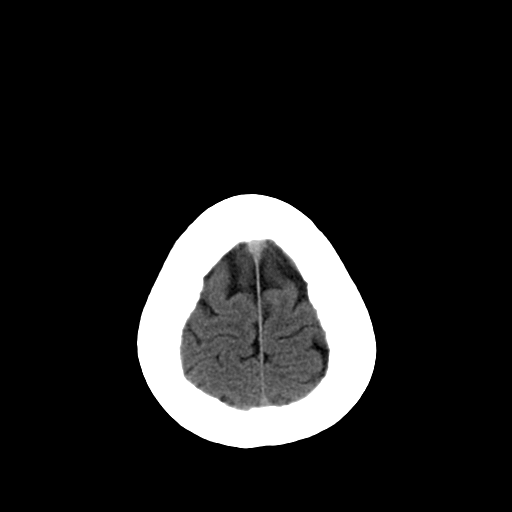
[im 23/28  bone]
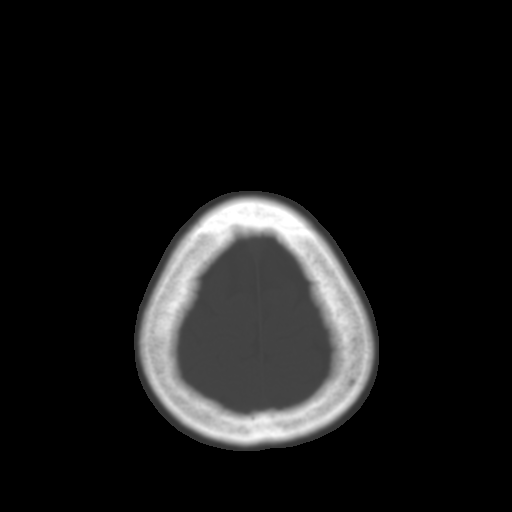
[im 25/28  brain]
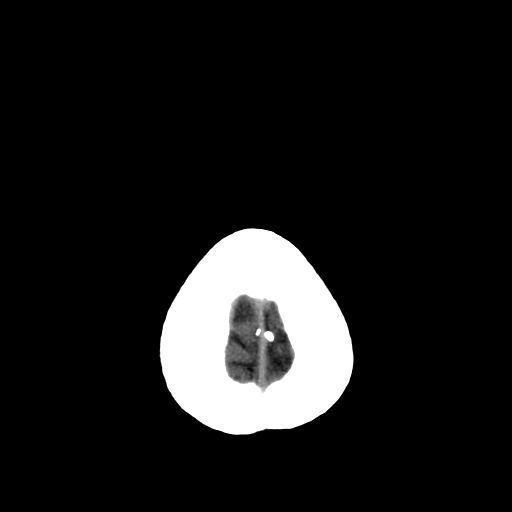
[im 27/28  brain]
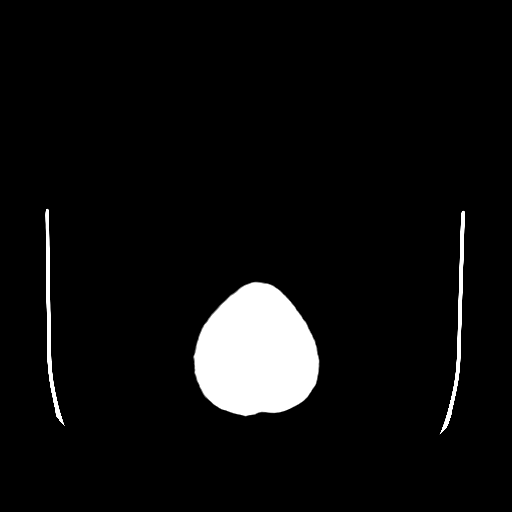

[15 of 28 positions shown; findings below may reference images not displayed]

FINDINGS: The patient does not show generalized atrophy.   There are one or two foci of low density in the deep white matter of the right posterior frontal region which could be due to white matter insults of indeterminate age.   No cortical or large vessel stroke.   There is a dilated perivascular space or lacunar stroke at the base of the brain on the left.   Perivascular space is favored.   The brainstem and cerebellum are normal.   There is no evidence of mass, hemorrhage, hydrocephalus, or extraaxial collection.   The calvarium has a normal appearance.   The paranasal sinuses are clear.   The mastoids are largely sclerotic.
IMPRESSION: 1.  No sign of intracranial hemorrhage or other acute process. 
 2.  Small areas of low density in the right posterior frontal white matter consistent with small vessel insults.  The age is indeterminate.

## 2006-05-04 ENCOUNTER — Encounter: Admission: RE | Admit: 2006-05-04 | Discharge: 2006-05-04 | Payer: Self-pay | Admitting: Nephrology

## 2006-05-14 ENCOUNTER — Ambulatory Visit (HOSPITAL_COMMUNITY): Admission: RE | Admit: 2006-05-14 | Discharge: 2006-05-14 | Payer: Self-pay | Admitting: Nephrology

## 2006-06-02 ENCOUNTER — Encounter: Admission: RE | Admit: 2006-06-02 | Discharge: 2006-06-02 | Payer: Self-pay | Admitting: Gastroenterology

## 2006-06-07 ENCOUNTER — Ambulatory Visit (HOSPITAL_COMMUNITY): Admission: RE | Admit: 2006-06-07 | Discharge: 2006-06-07 | Payer: Self-pay | Admitting: Gastroenterology

## 2006-06-16 ENCOUNTER — Encounter: Admission: RE | Admit: 2006-06-16 | Discharge: 2006-06-16 | Payer: Self-pay | Admitting: Nephrology

## 2006-07-06 IMAGING — CR DG CHEST 1V PORT
1 series · 1 of 1 positions shown · non-contrast
Comparison: 02/08/05.

CLINICAL DATA: Low blood pressure.
 PORTABLE CHEST - 1 VIEW - 06/22/05:

[view not recorded]
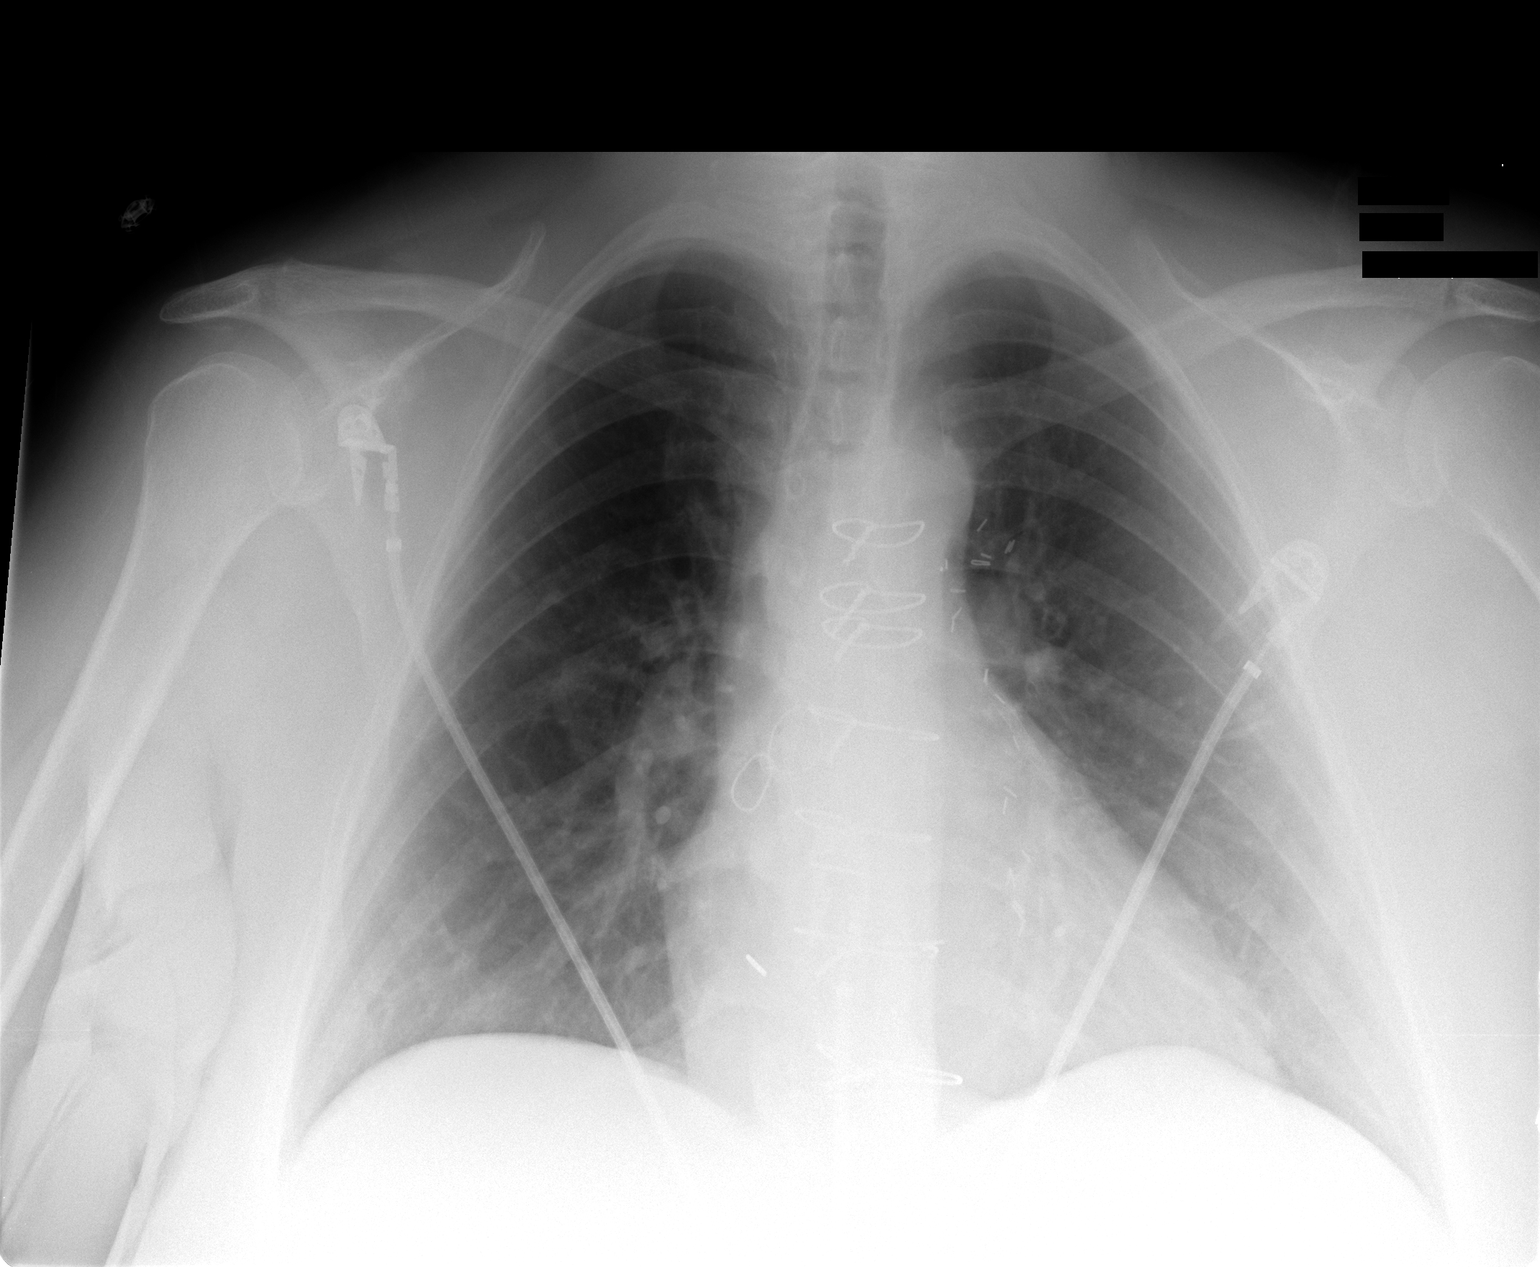

[1 of 1 positions shown; findings below may reference images not displayed]

FINDINGS: The patient is status post CABG procedure.  The heart size is mildly enlarged.  There are no effusions or pulmonary edema.  No focal air space opacities are identified.
IMPRESSION: 1.  Status post CABG procedure.  
 2.  No acute cardiopulmonary abnormalities.

## 2006-07-10 IMAGING — CT CT MAXILLOFACIAL W/O CM
1 series · 16 of 30 positions shown, 20 images · IV contrast (agent unspecified)
Comparison: 12/29/04 ? standard coronal images.

CLINICAL DATA: Fever/facial pain.
 MAXILLOFACIAL CT WITHOUT CONTRAST:
TECHNIQUE: Axial CT images were obtained through the maxillofacial region including the facial bones, orbits, and paranasal sinuses.  No intravenous contrast was administered. The patient was unable to assume the extended prone position which is the standard method of scanning.  This exam was done with her supine. Axial images were obtained at 2.5 mm increments.

[Series 3: recon 2: sinus supine · axial · 0.33mm/px · z∈[+83,+187]mm · 16 of 44 slices shown, 20 images]
[im 2/44  brain]
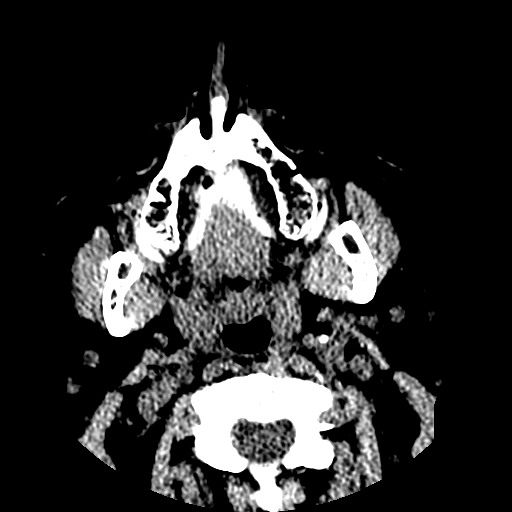
[im 2/44  bone]
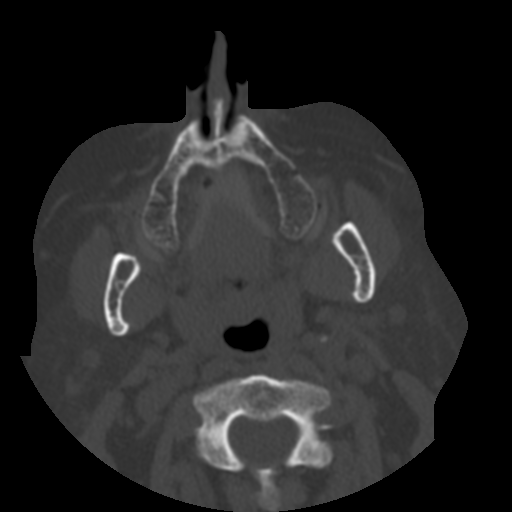
[im 5/44  bone]
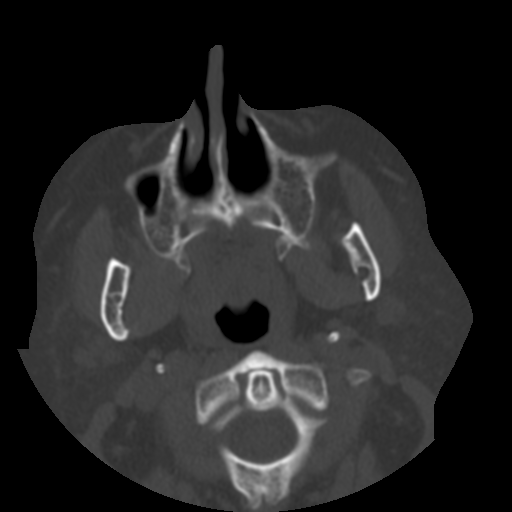
[im 8/44  bone]
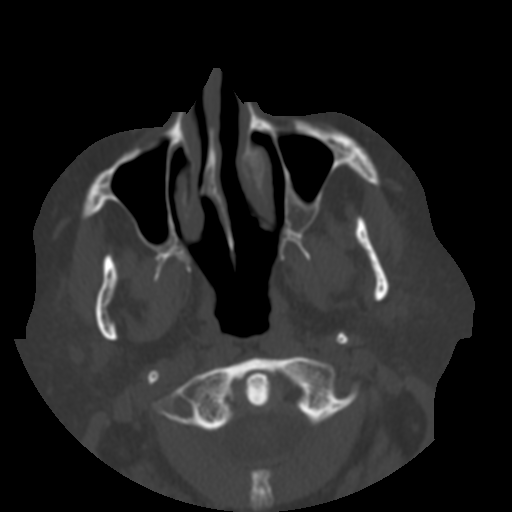
[im 11/44  bone]
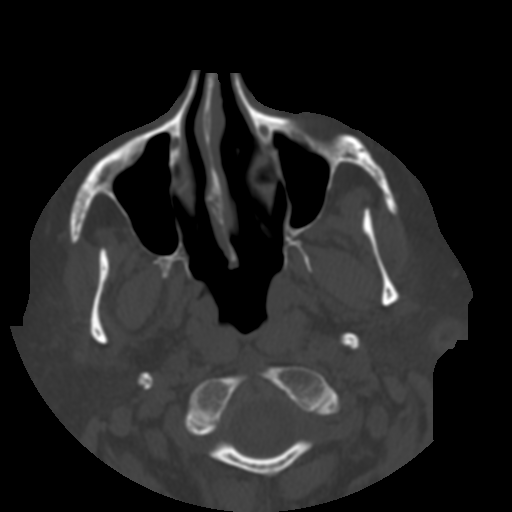
[im 12/44  brain]
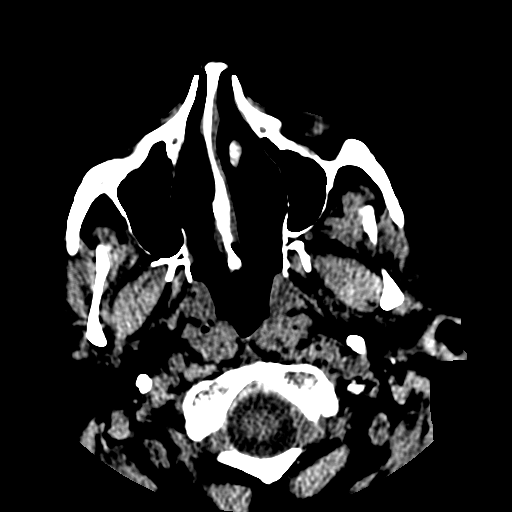
[im 12/44  bone]
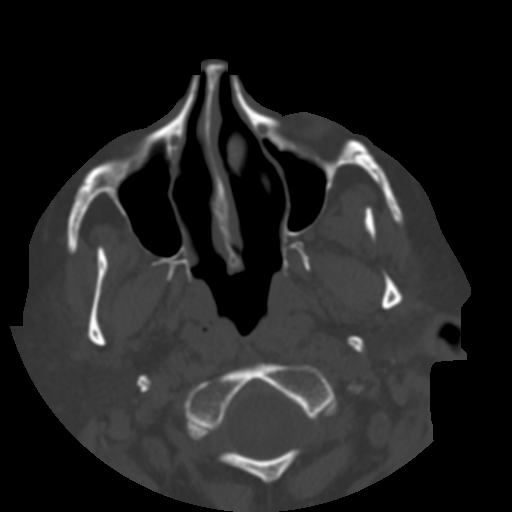
[im 15/44  bone]
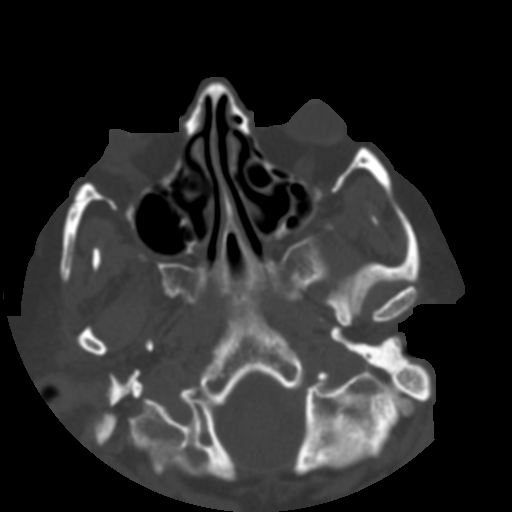
[im 18/44  bone]
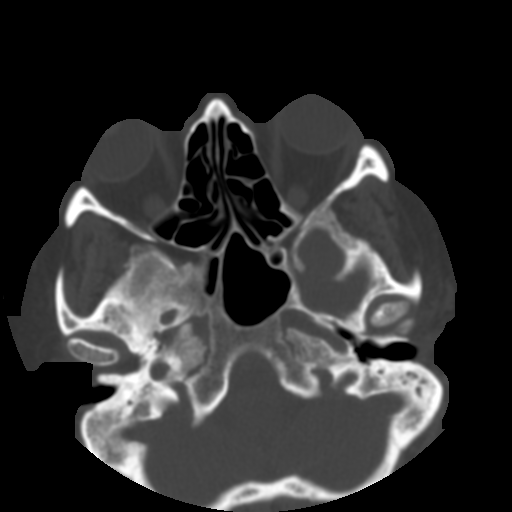
[im 21/44  bone]
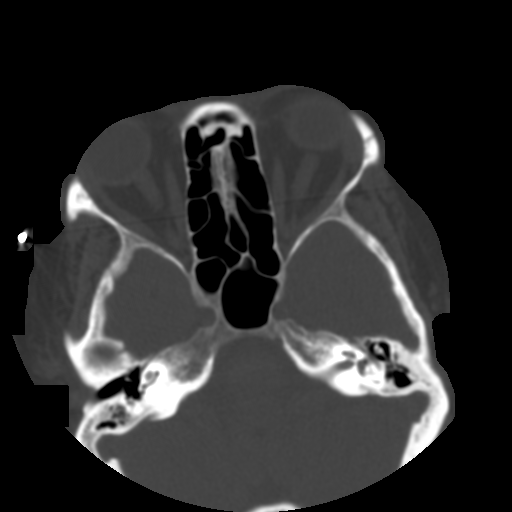
[im 23/44  brain]
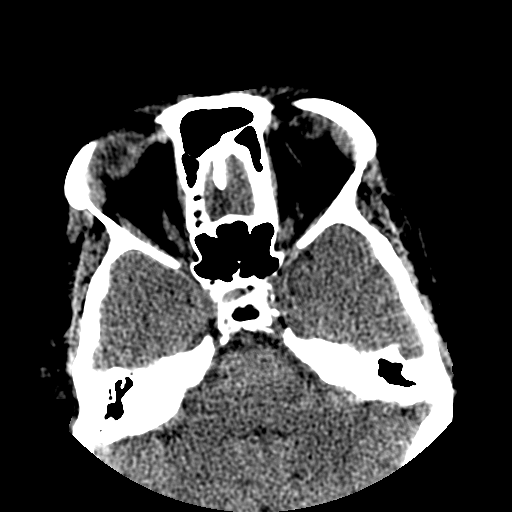
[im 23/44  bone]
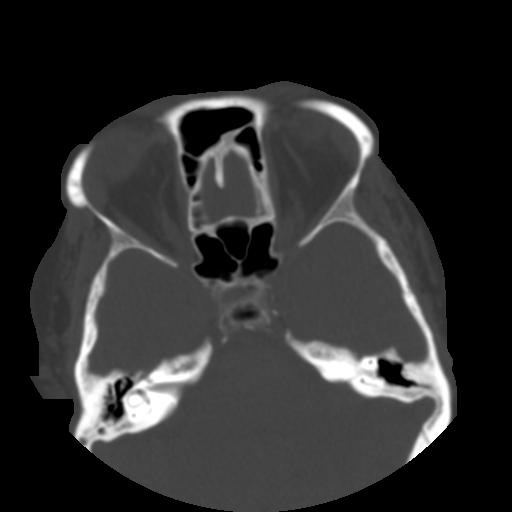
[im 26/44  bone]
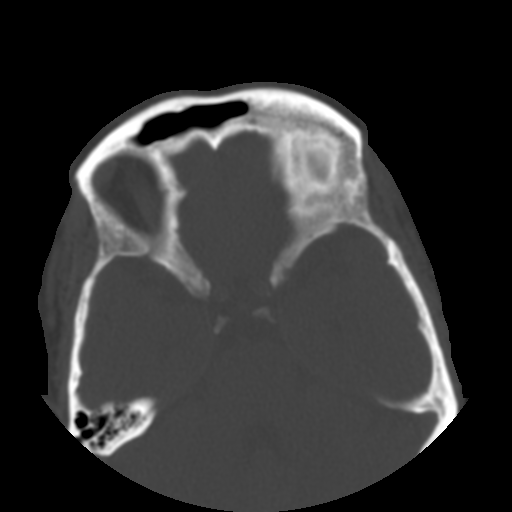
[im 29/44  bone]
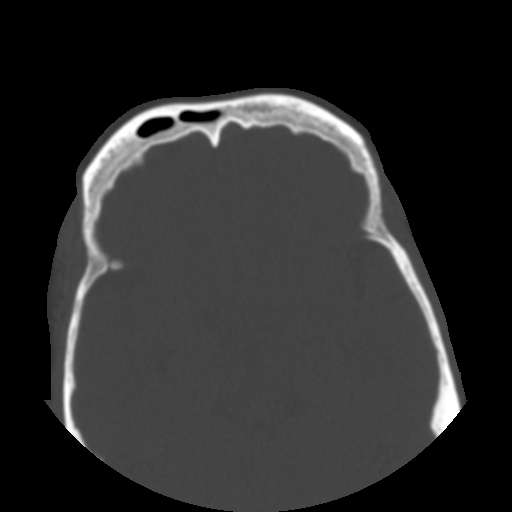
[im 32/44  bone]
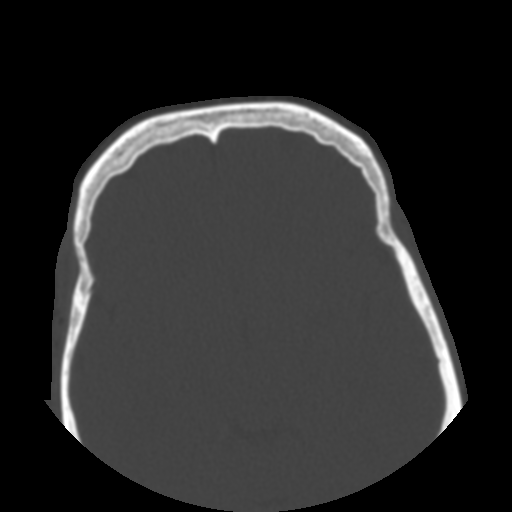
[im 33/44  brain]
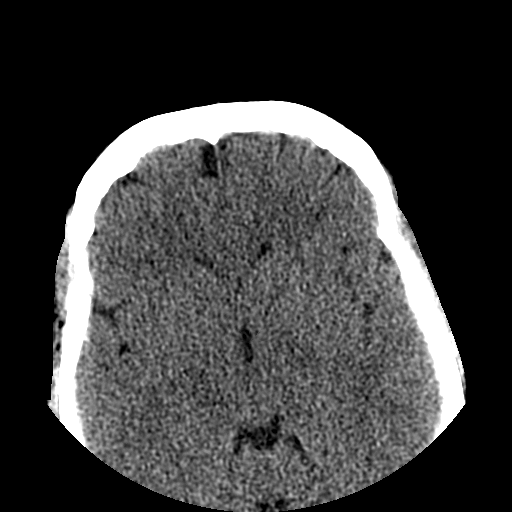
[im 33/44  bone]
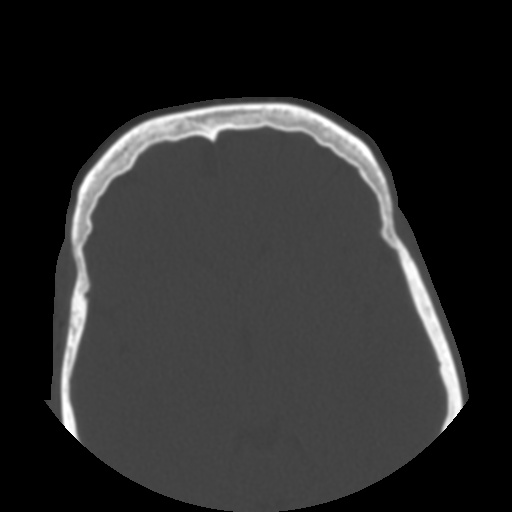
[im 36/44  bone]
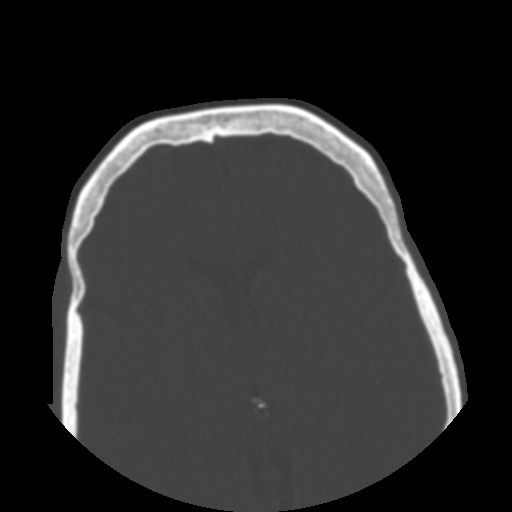
[im 39/44  bone]
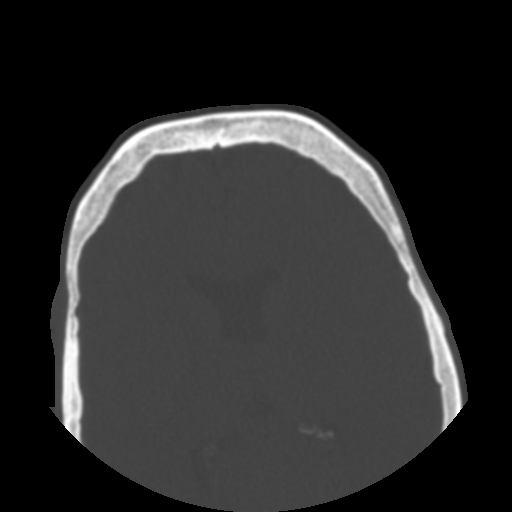
[im 42/44  bone]
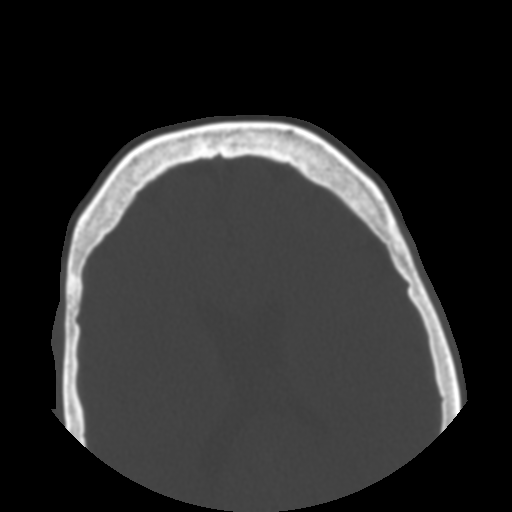

[16 of 30 positions shown; findings below may reference images not displayed]

FINDINGS: At this time there are no changes of acute or chronic sinusitis. The ostiomeatal unit cannot be evaluated in this plane.  There appears to be no grossly unfavorable anatomy.
IMPRESSION: No changes of acute or chronic sinusitis at this time.

## 2006-08-23 IMAGING — CT CT CHEST W/O CM
1 series · 15 of 33 positions shown, 19 images · non-contrast
Comparison: none

CLINICAL DATA: Lung nodule on chest x-ray.  Renal transplant.
CHEST CT WITHOUT CONTRAST ? 08/09/05:
TECHNIQUE: Multidetector CT imaging of the chest was performed following the standard protocol without IV contrast. 
Chest x-ray of 08/09/05 was reviewed showing a small nodular opacity at the left lung base.

[Series 2: routine chest · axial · 0.65mm/px · z∈[-346,-96]mm · 15 of 60 slices shown, 19 images]
[im 5/60  mediastinal]
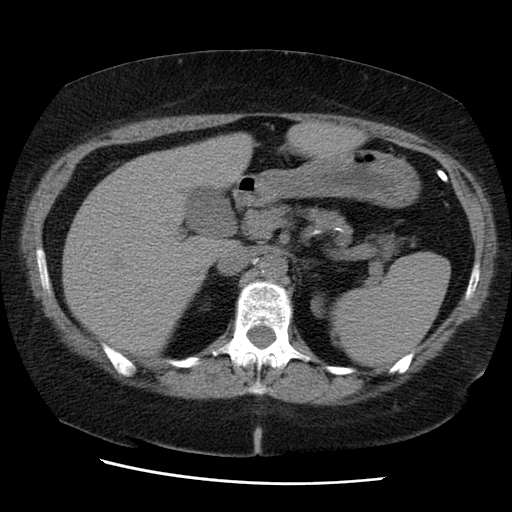
[im 5/60  lung]
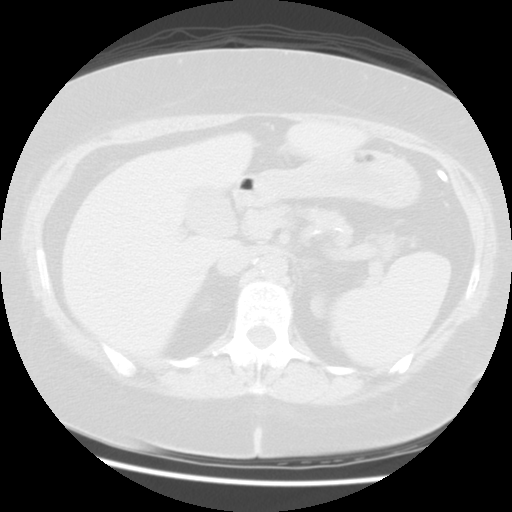
[im 9/60  lung]
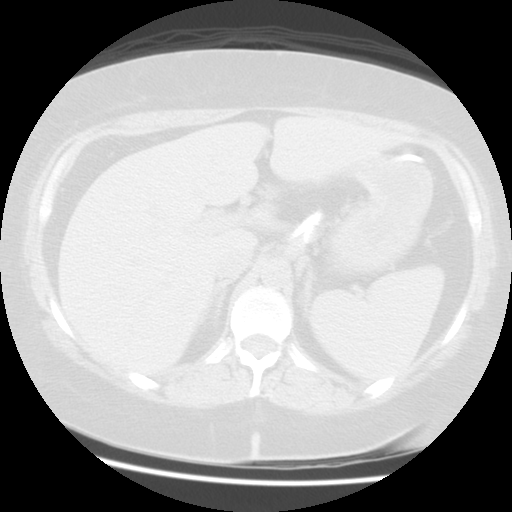
[im 12/60  lung]
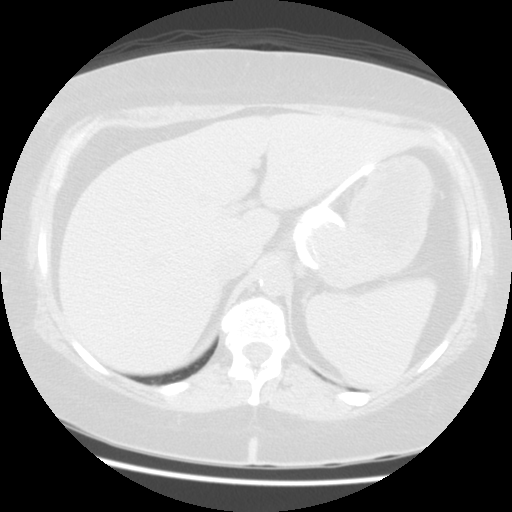
[im 16/60  lung]
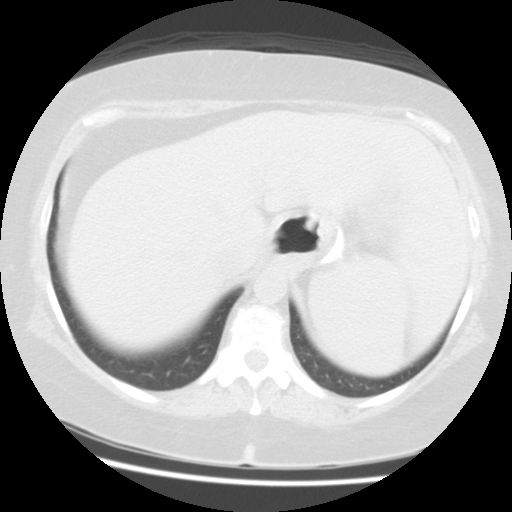
[im 20/60  mediastinal]
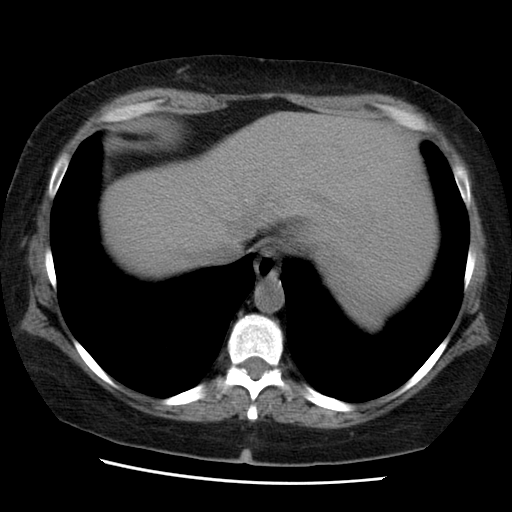
[im 20/60  lung]
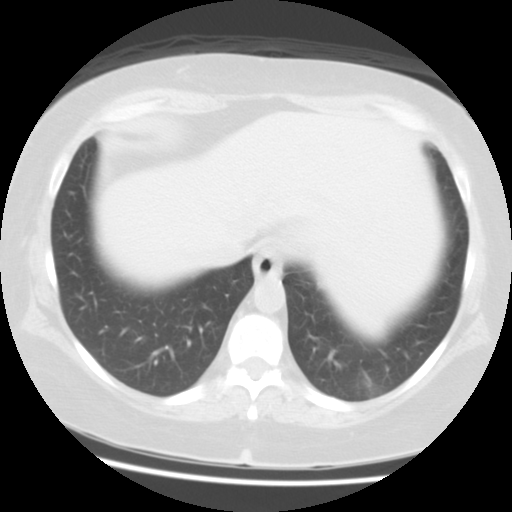
[im 24/60  lung]
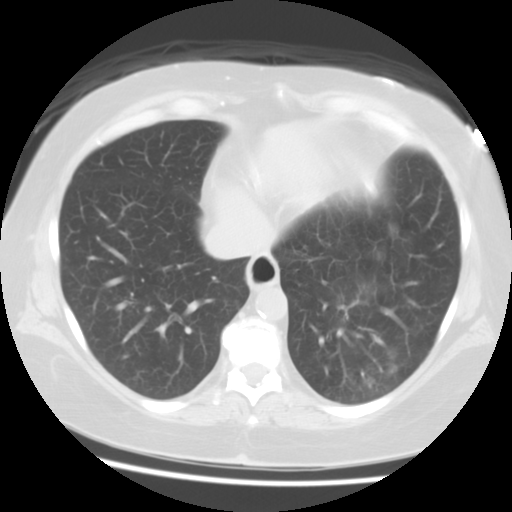
[im 27/60  lung]
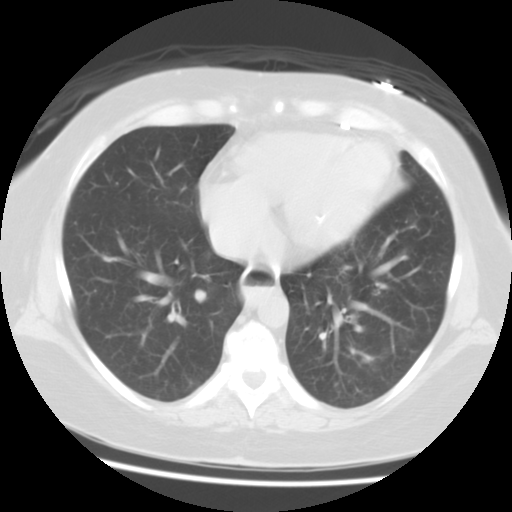
[im 31/60  lung]
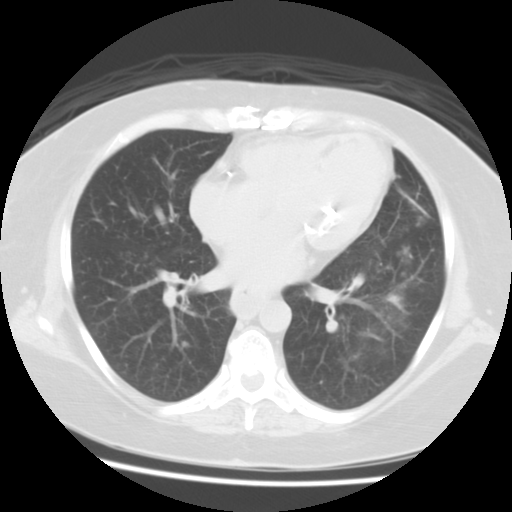
[im 33/60  mediastinal]
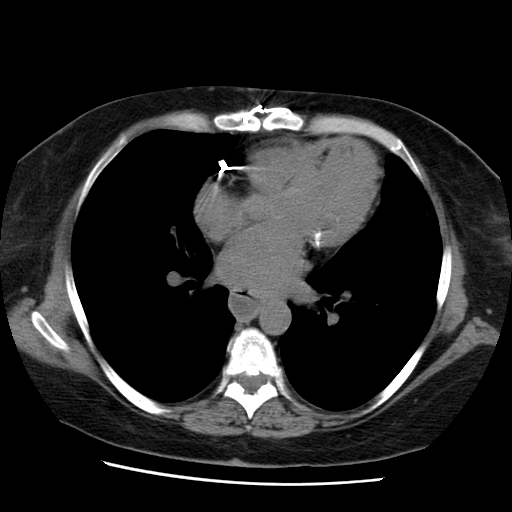
[im 33/60  lung]
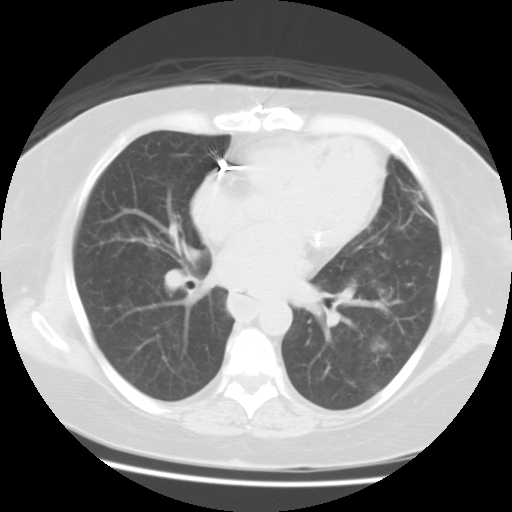
[im 36/60  lung]
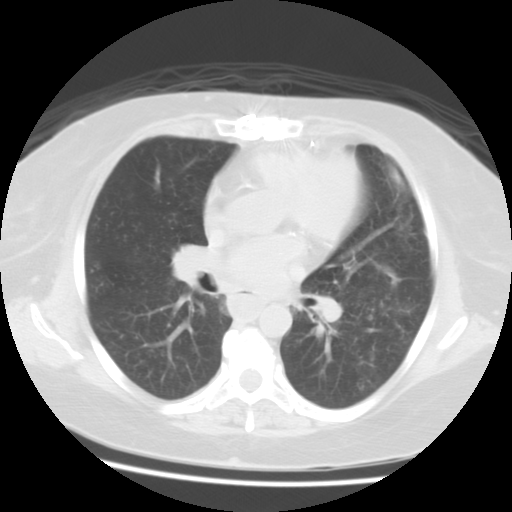
[im 40/60  lung]
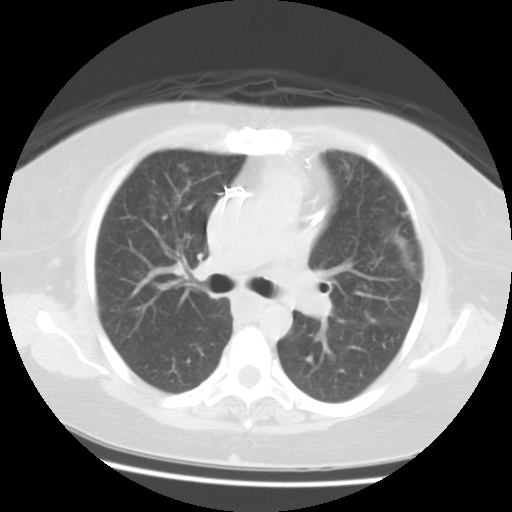
[im 44/60  lung]
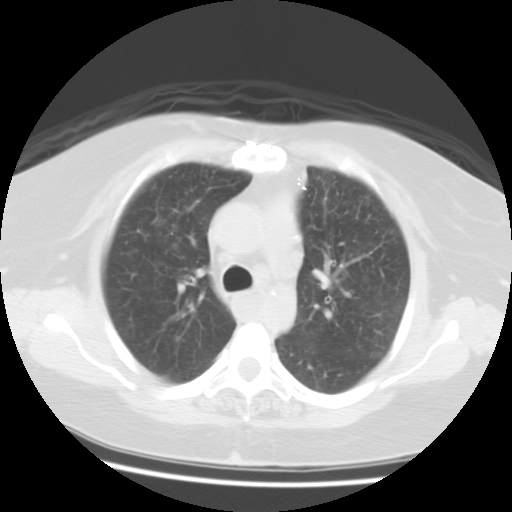
[im 48/60  mediastinal]
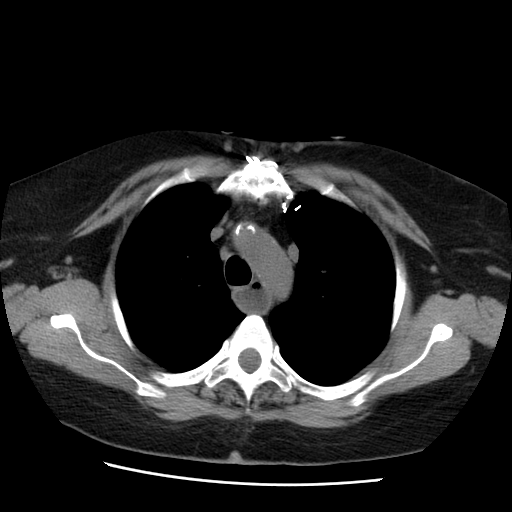
[im 48/60  lung]
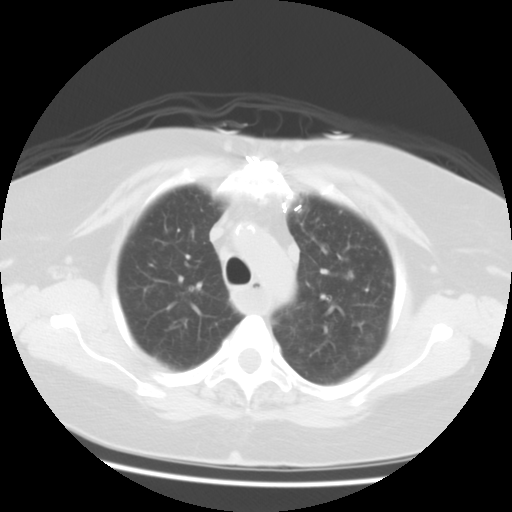
[im 51/60  lung]
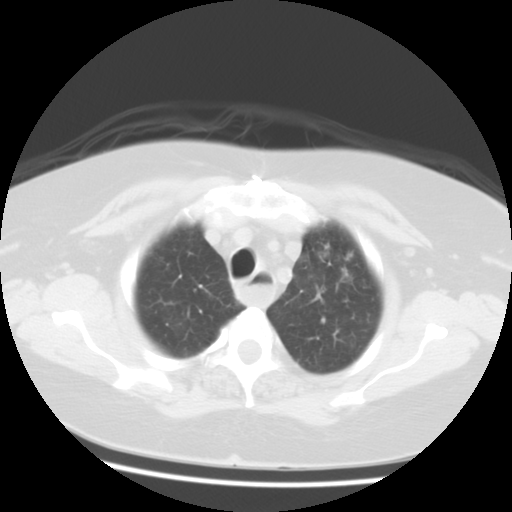
[im 55/60  lung]
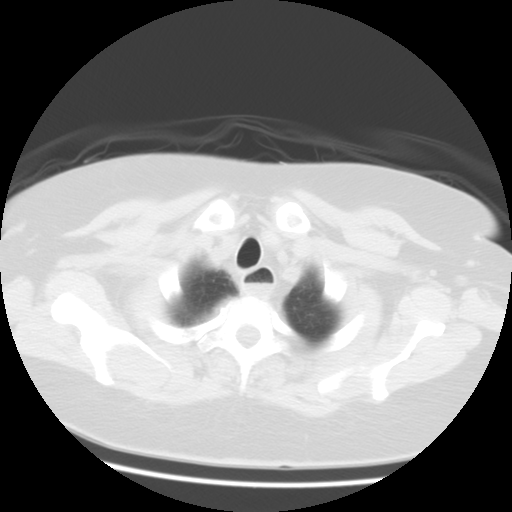

[15 of 33 positions shown; findings below may reference images not displayed]

FINDINGS: On lung window images, there is a vague parenchymal opacity in the left lower lobe and to a lesser degree the lingular which may account for the questioned nodular opacity.  This patchy opacity may be inflammatory in nature as with pneumonia.  In addition, particularly in the upper lung fields, there is a subtle reticulonodular pattern in the upper lobes, right greater than left.   This is a nonspecific finding and could be inflammatory in nature.  Other considerations would be that of metastatic disease or possibly sarcoidosis.  Follow-up CT chest is recommended to assess stability of these findings.  No effusion is seen.  The thyroid gland appears slightly prominent with low attenuation most consistent with a goiter.  Clinical correlation is recommended.  The esophagus is noted to be fluid-filled, generally seen with obstruction.  However, no point of obstruction is seen and therefore this finding is of questionable significance.  The patient does appear to have a lap band around the fundus of the stomach below the gastroesophageal junction.  Small atrophic kidneys are noted.  There are a few mediastinal nodes, none of which are pathologically enlarged.  Median sternotomy sutures are noted.
IMPRESSION: 1.  The area questioned at the left lung base represents a vague parenchymal opacity on CT of the chest and may be inflammatory in nature.  There is also a subtle reticulonodular pattern in the upper lobes, right greater than left, and follow-up CT of the chest is recommended to assess stability.
2.  Lap band.  Fluid in esophagus possibly due to this banding.
3.  Prominent thyroid gland with areas of low attenuation most consistent with a goiter.  Correlate clinically.

## 2006-08-23 IMAGING — CR DG CHEST 2V
2 series · 2 of 2 positions shown · non-contrast
Comparison: 06/22/05 and 02/08/05.

CLINICAL DATA: 49 year old female; shortness of breath, nausea, and vomiting. 
2 VIEW CHEST RADIOGRAPH - 08/09/05:

[w chest pa]
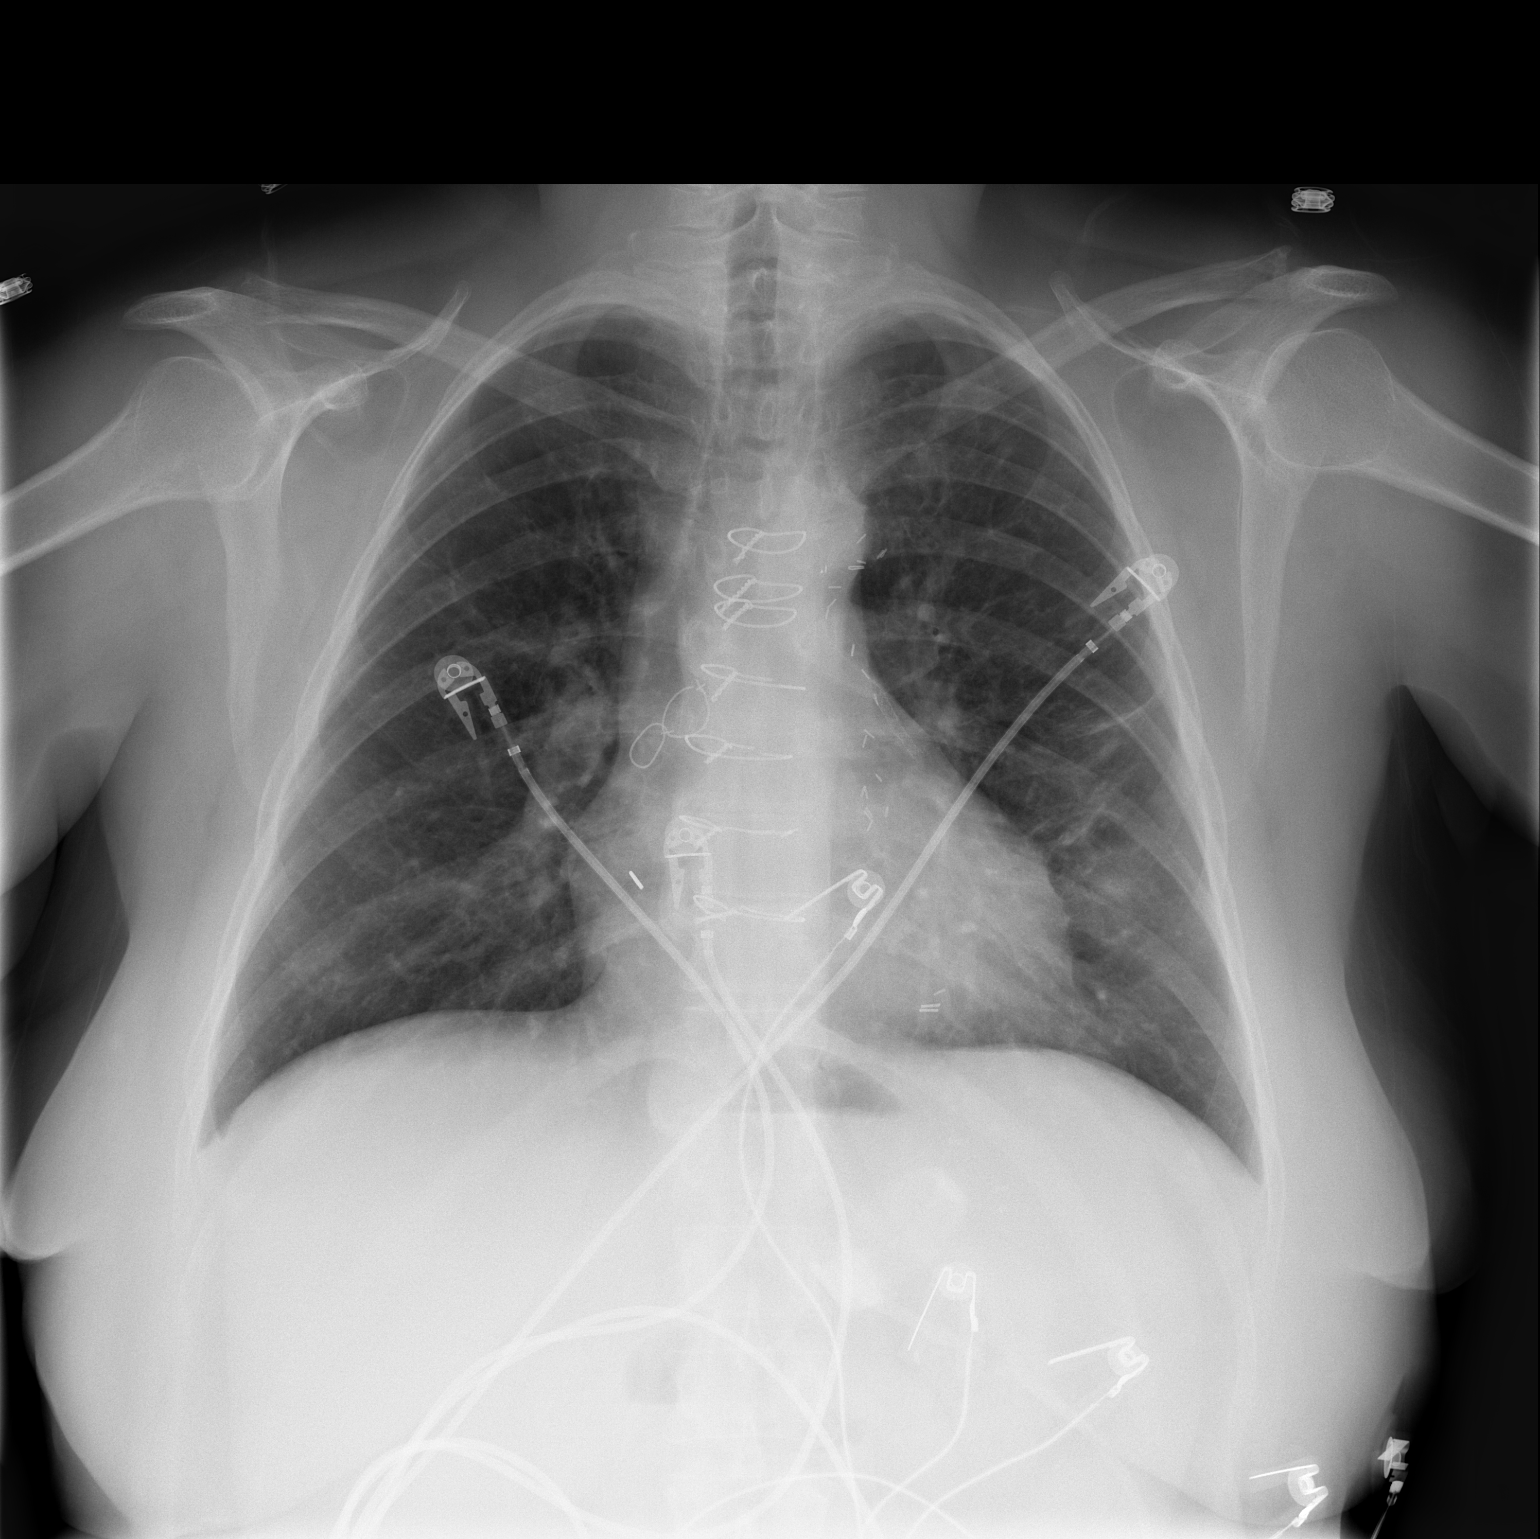

[w chest lat]
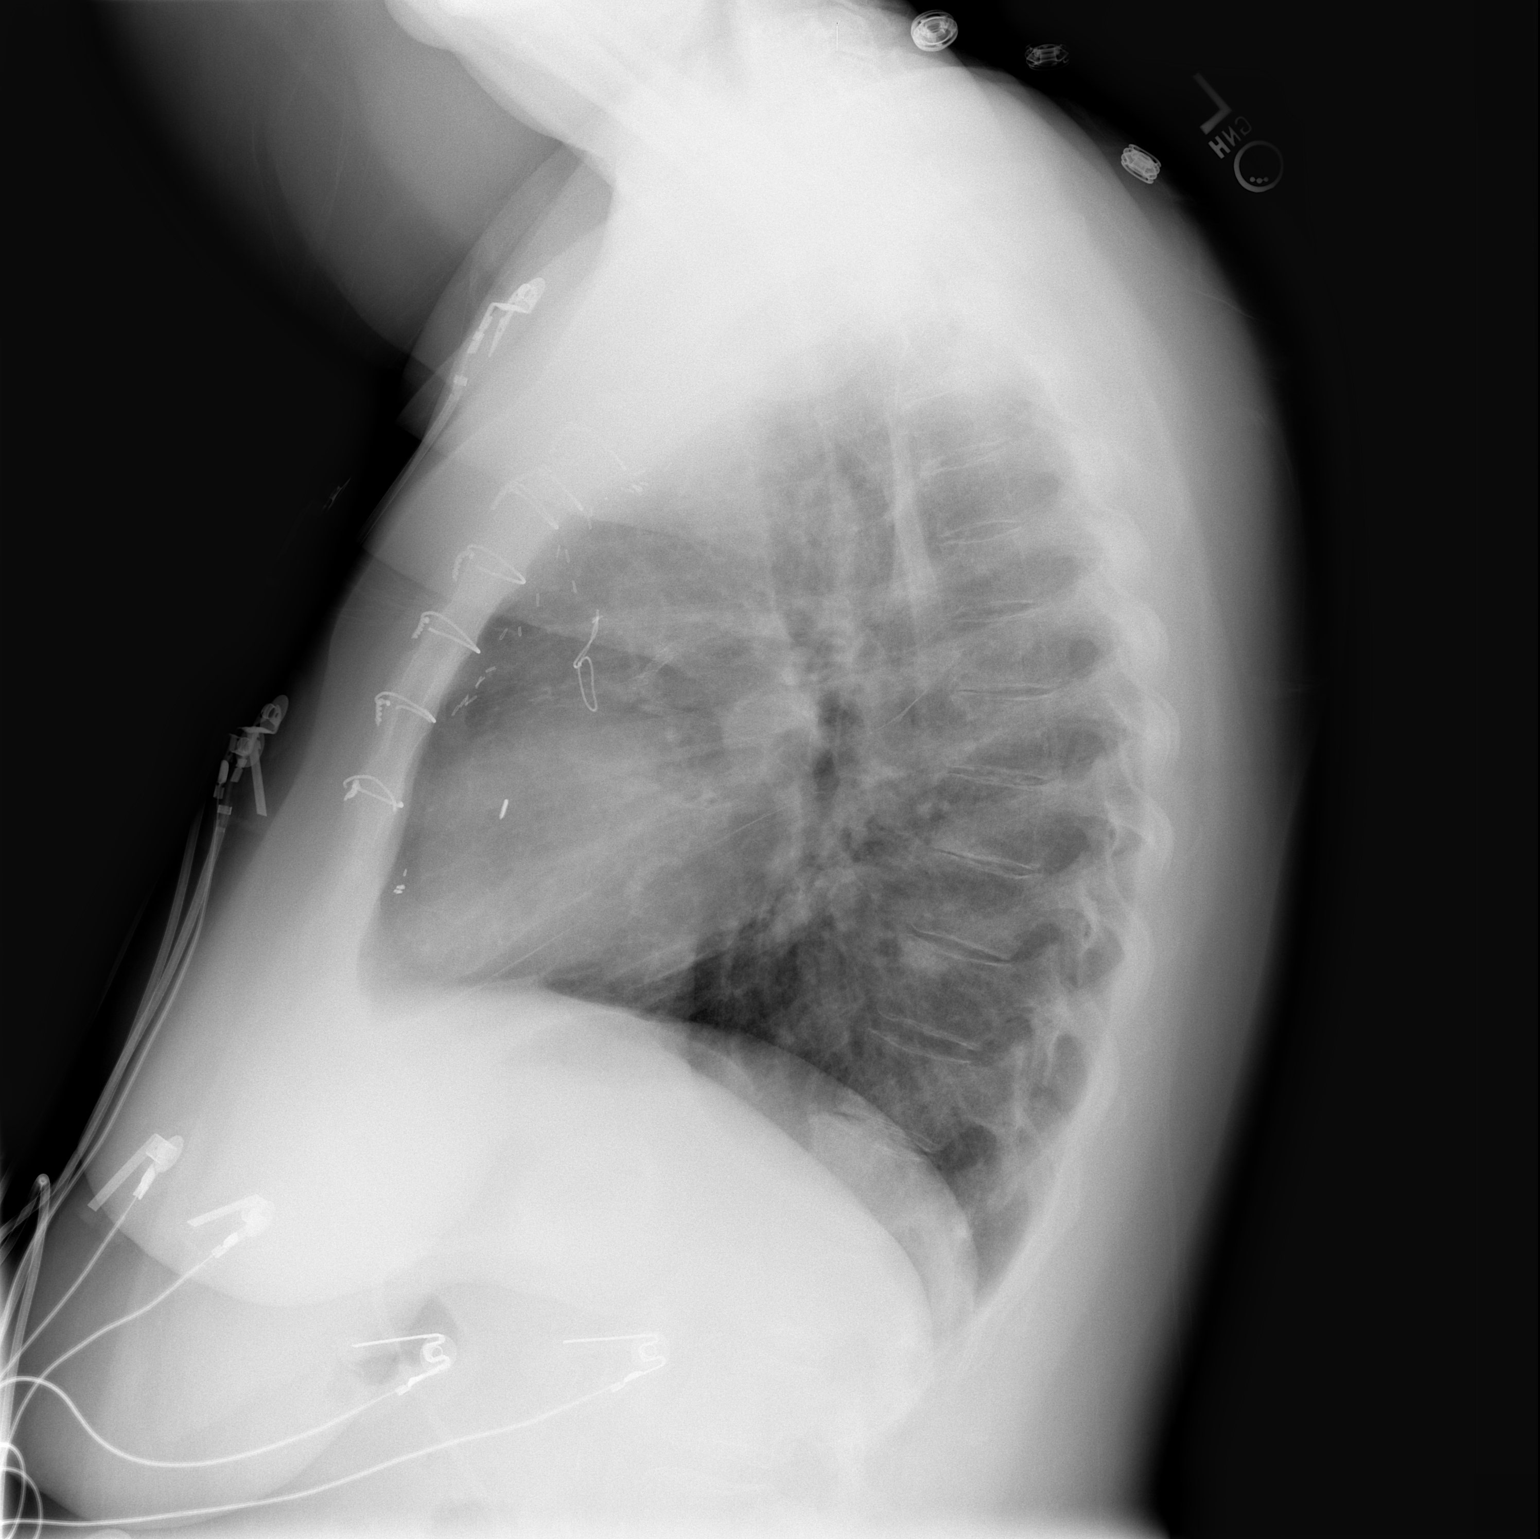

[2 of 2 positions shown; findings below may reference images not displayed]

FINDINGS: The patient has had a median sternotomy for CABG.  Stable prominent heart size and central vascular markings.  Left midlung and lingular scarring.  No acute congestive heart failure, effusion, or pneumothorax.  In the left midlung, along the cardiac border, there is a new 7 mm nodular density and a developing nodule is not excluded.  Recommend follow-up CT of the chest when the patient is able.
IMPRESSION: Postoperative and chronic changes of the chest with a new left midlung faint nodular density.  An underlying developing nodule is not excluded.  Recommend follow-up CT.

## 2006-08-26 ENCOUNTER — Encounter: Admission: RE | Admit: 2006-08-26 | Discharge: 2006-08-26 | Payer: Self-pay | Admitting: Nephrology

## 2006-09-12 ENCOUNTER — Inpatient Hospital Stay (HOSPITAL_COMMUNITY): Admission: EM | Admit: 2006-09-12 | Discharge: 2006-09-15 | Payer: Self-pay | Admitting: Emergency Medicine

## 2006-09-13 IMAGING — CR DG ABDOMEN ACUTE W/ 1V CHEST
4 series · 4 of 4 positions shown · non-contrast
Comparison: One view abdomen x-ray 09/22/2004. Two-view chest x-ray 08/09/2005.

CLINICAL DATA: Abdominal pain, nausea and vomiting. History of gastric banding
approximately one year ago. Hypoglycemia.

ACUTE ABDOMEN SERIES  (2 VIEW  ABDOMEN AND 1 VIEW CHEST)  08/30/2005:

[w chest pa]
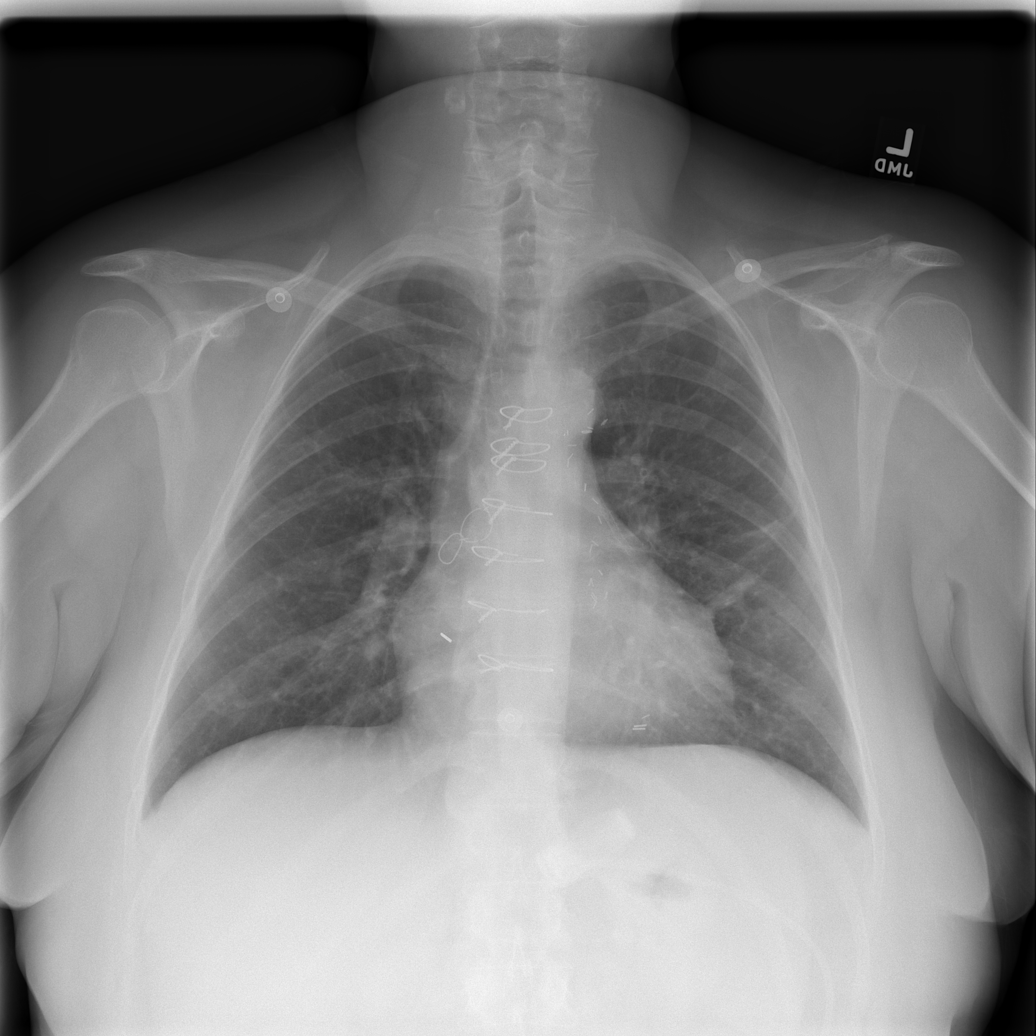

[w abdomen upright *]
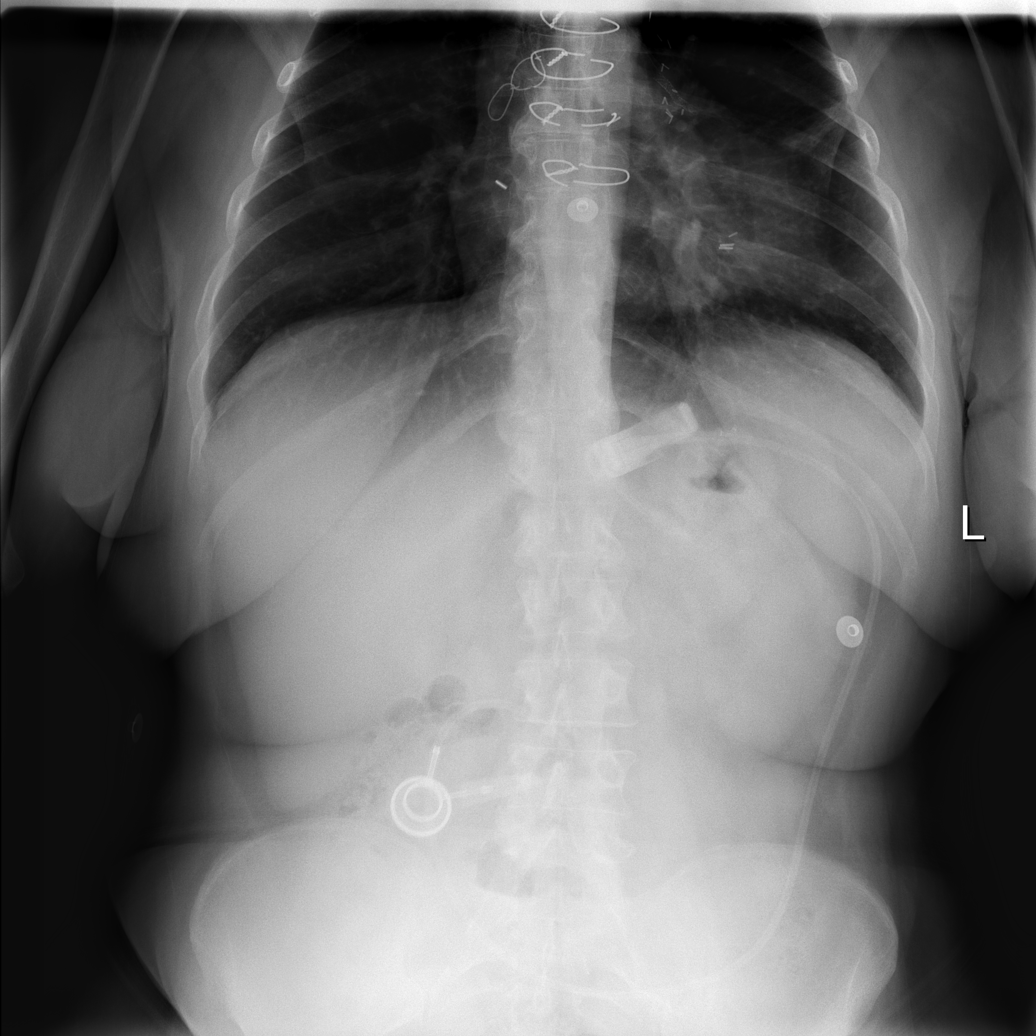

[t abdomen supine (1 of 2)]
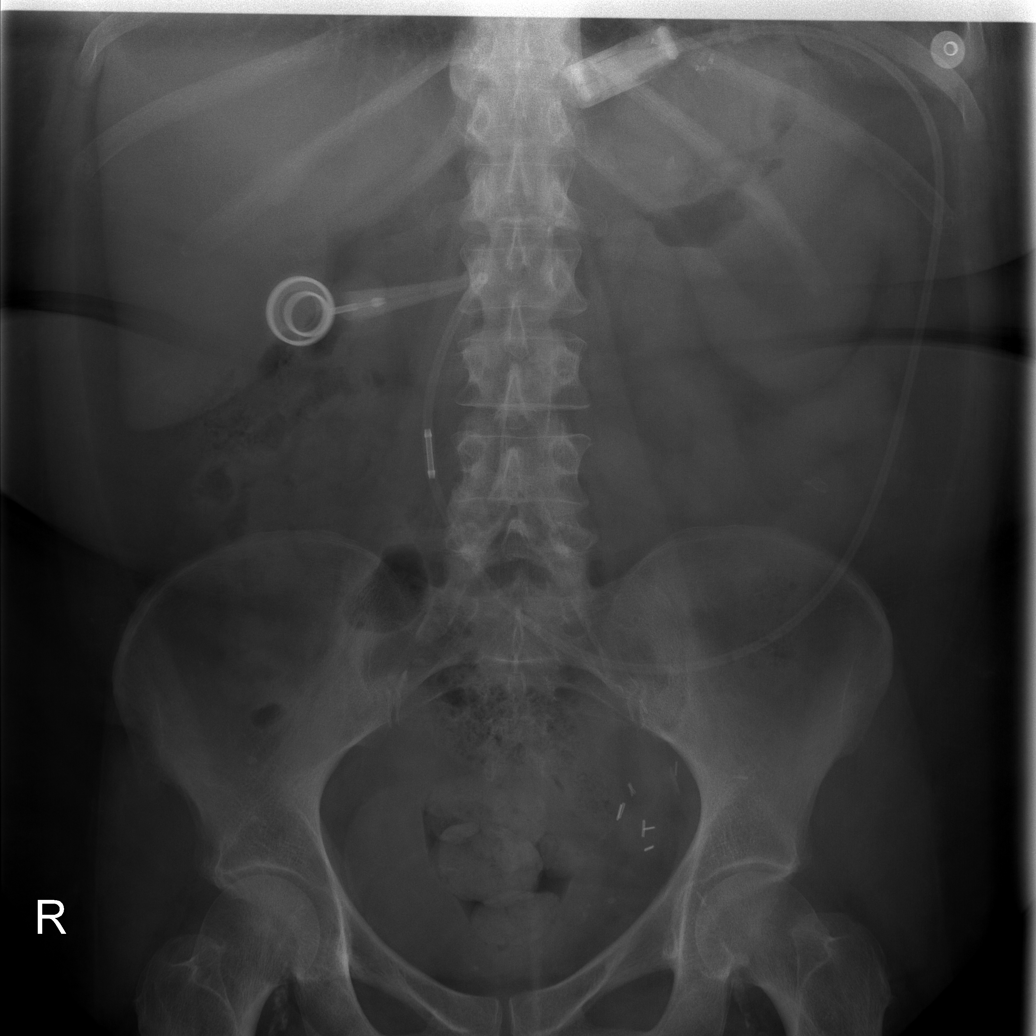

[t abdomen supine (2 of 2)]
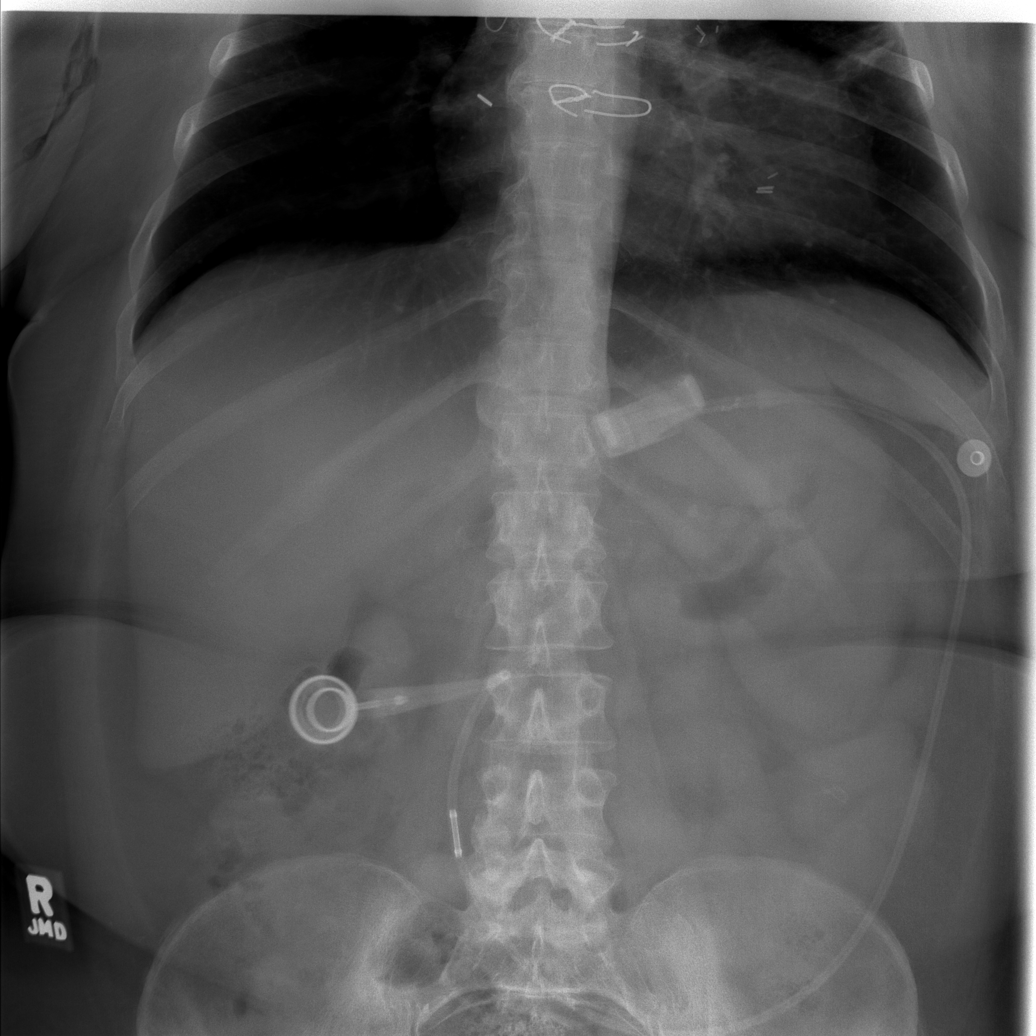

[4 of 4 positions shown; findings below may reference images not displayed]

FINDINGS: The gastric band is appropriately positioned, and is oriented from
[DATE] to [DATE]. The bowel gas pattern is unremarkable and there is no evidence of
obstruction or significant ileus. There is no evidence of free air on the erect
view. No abnormal calcifications are identified. Psoas margins are intact.
Surgical clips are present in the left side of the pelvis.

The accompanying PA chest x-ray again demonstrates prior CABG. The heart size is
normal. There is linear scar in the left midlung, unchanged. The lungs are
otherwise clear. The nodule identified on the prior chest x-ray is no longer
seen and may represent an area of atelectasis.
IMPRESSION: No acute abdominal or pulmonary abnormalities.

## 2006-10-29 ENCOUNTER — Ambulatory Visit: Payer: Self-pay | Admitting: Pulmonary Disease

## 2006-11-23 ENCOUNTER — Ambulatory Visit: Payer: Self-pay | Admitting: Pulmonary Disease

## 2007-01-25 ENCOUNTER — Ambulatory Visit: Payer: Self-pay | Admitting: Pulmonary Disease

## 2007-02-22 IMAGING — CR DG CHEST 2V
2 series · 2 of 2 positions shown · non-contrast
Comparison: 08/09/05.

CLINICAL DATA: Productive cough.  Please evaluate. 
 TWO VIEW CHEST:

[view not recorded (1 of 2)]
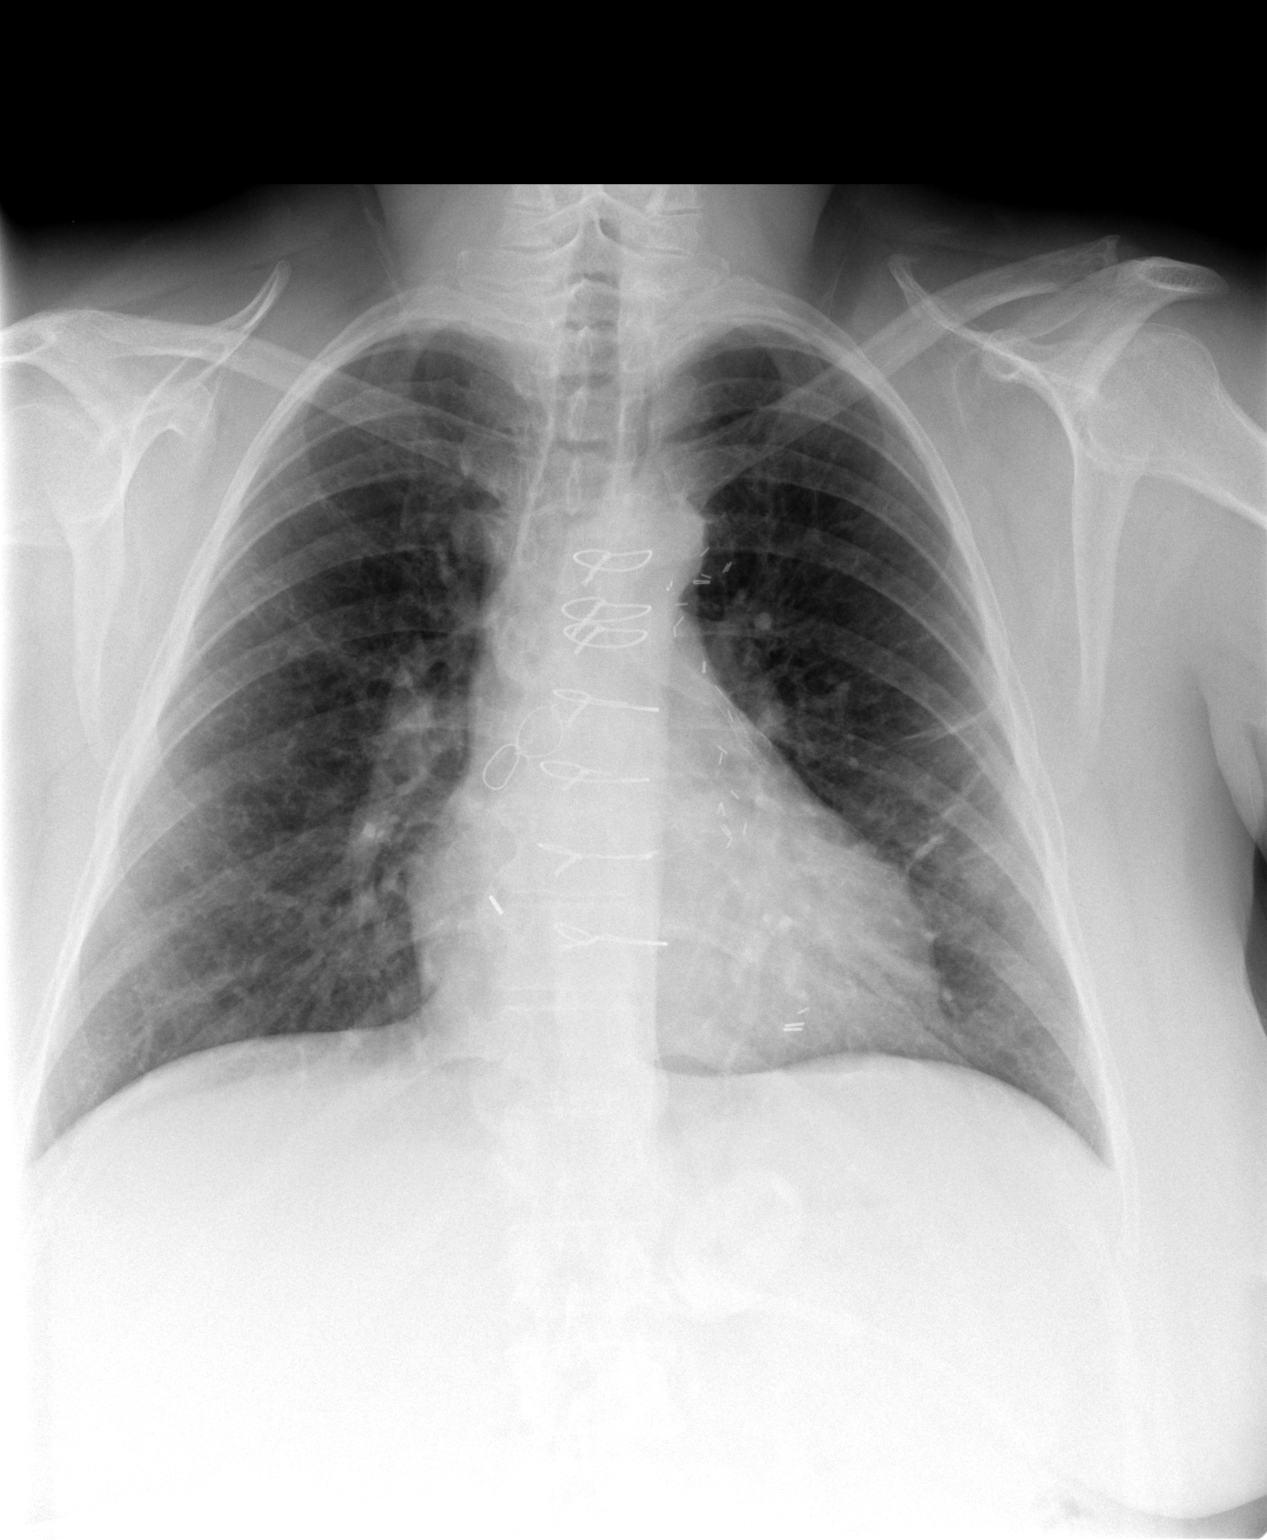

[view not recorded (2 of 2)]
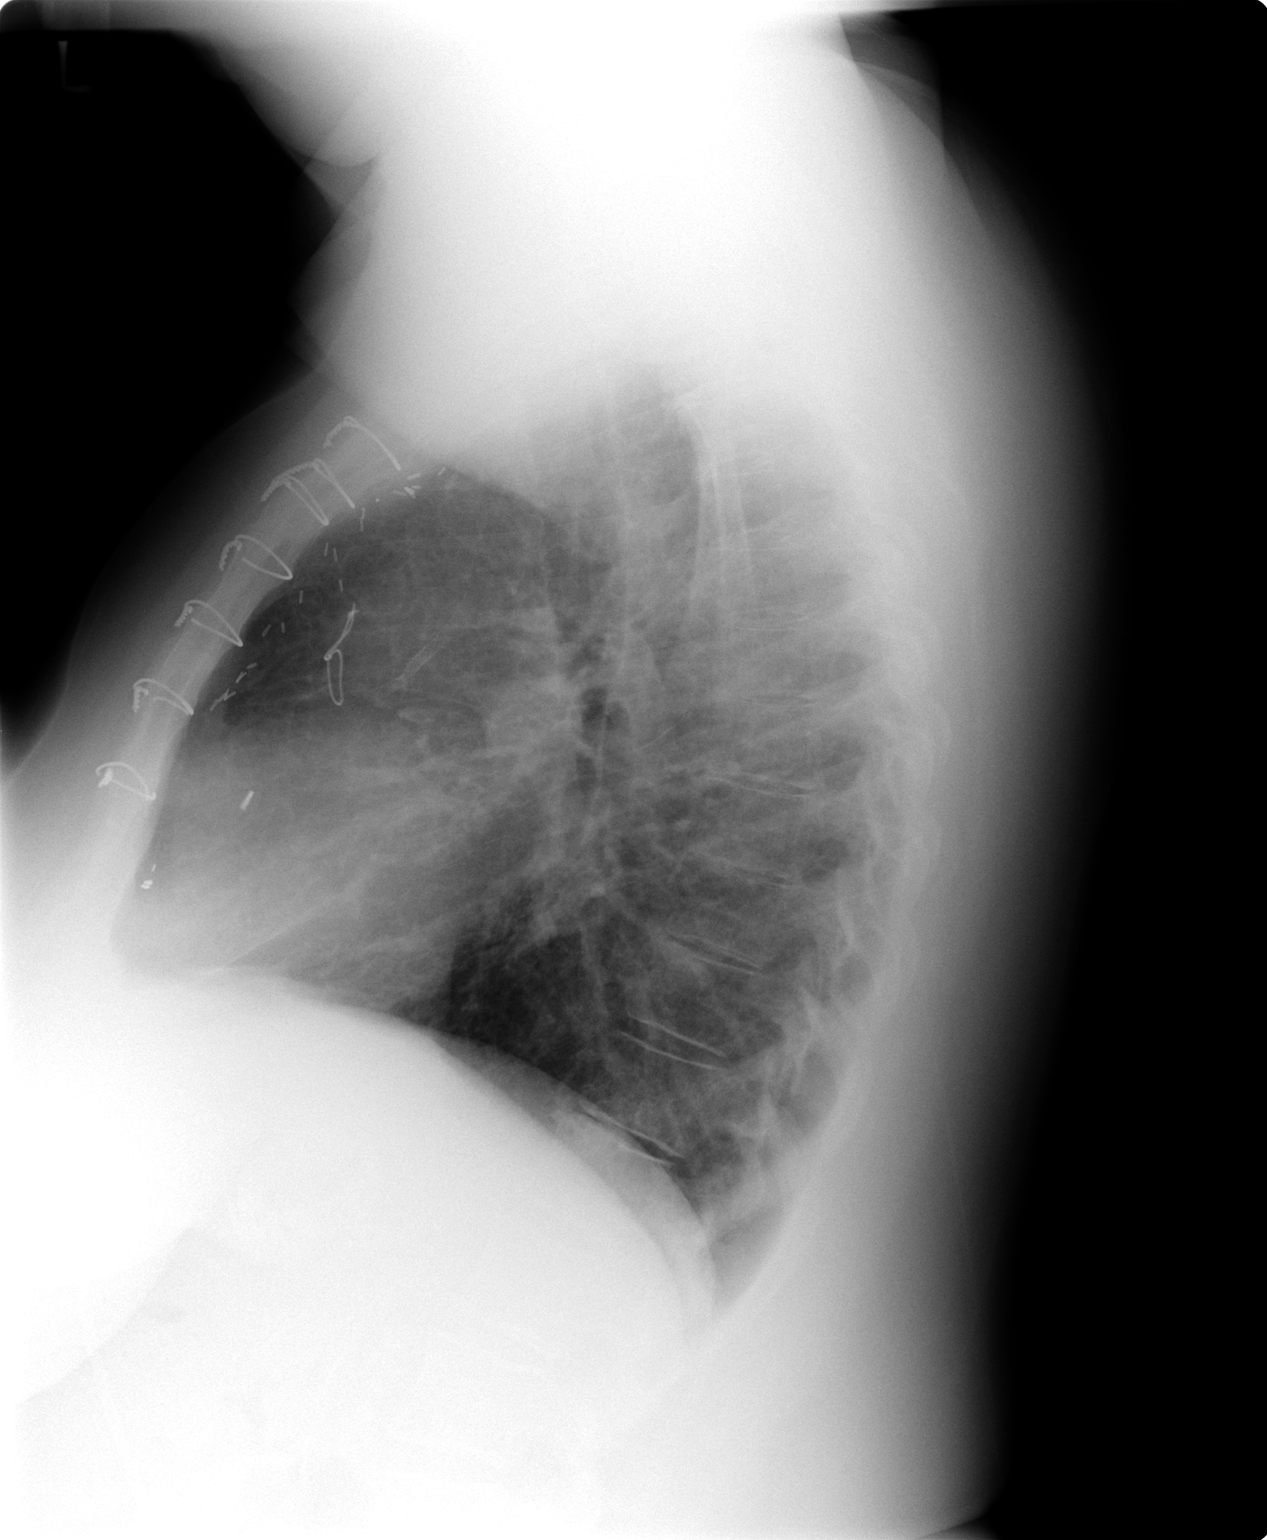

[2 of 2 positions shown; findings below may reference images not displayed]

Linear scaring changes seen within the left mid and lower lung zone.  There remains a questionable nodular density associated with the left lung base.  There is a subtle infiltrate seen in this region on the prior CT.  However as a seen nodular density persists I would recommend considering repeat chest CT with attention to this area.  There has been a previous median sternotomy.  The heart is borderline in size.  Coronary artery stent noted.
IMPRESSION: 1.  Linear scarring changes within the left mid and lower lung zone.  Question left basilar nodule.  Consider repeat chest CT scan. 
 2.  Mild cardiomegaly. 
 3.  No infiltrates.

## 2007-02-26 IMAGING — CT CT CHEST W/O CM
3 of 4 series · 16 of 36 positions shown, 19 images · IV contrast (agent unspecified)
Comparison: Plain films, 02/08/06, and CT chest, 08/09/05.

CLINICAL DATA: Follow-up left base nodule.  Pain with shortness of breath.
CHEST CT WITHOUT CONTRAST:
TECHNIQUE: Multidetector CT imaging of the chest was performed following the standard protocol without IV contrast.

[Series 2: chest w/o · axial · non-contrast · 0.66mm/px · z∈[-380,-130]mm · 11 of 61 slices shown, 14 images (1 of 2)]
[im 6/61  mediastinal]
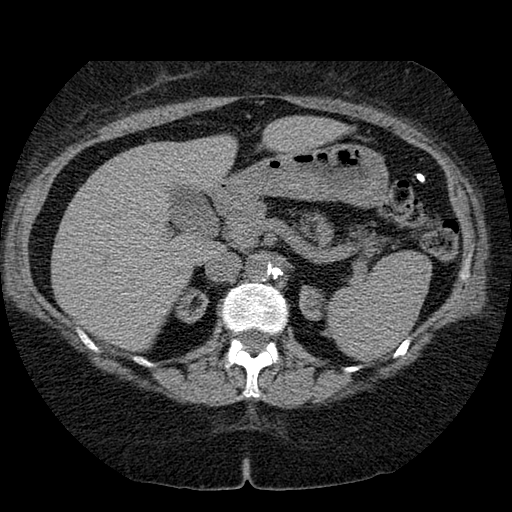
[im 6/61  lung]
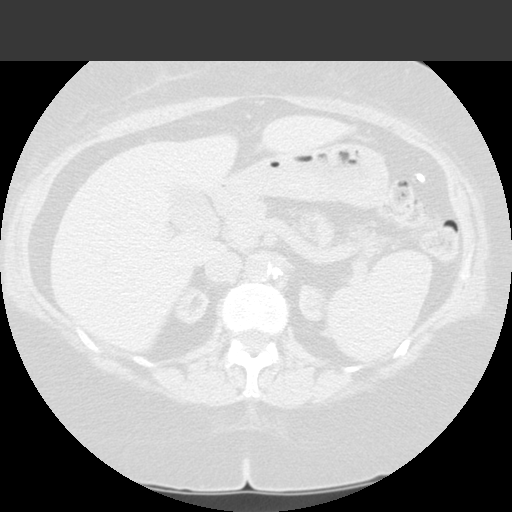
[im 11/61  lung]
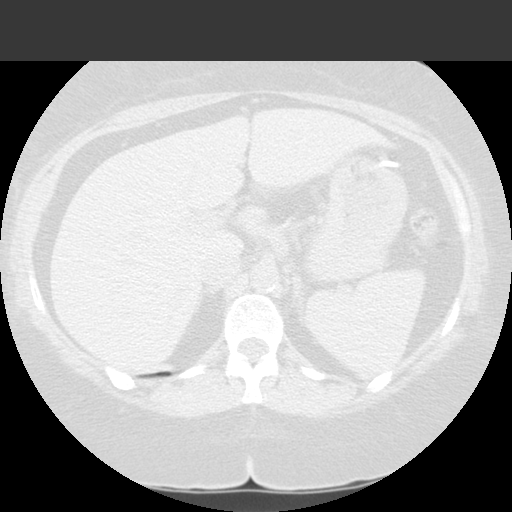
[im 16/61  lung]
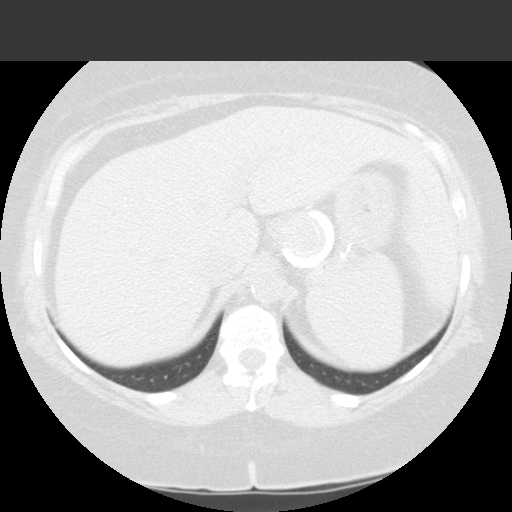
[im 21/61  lung]
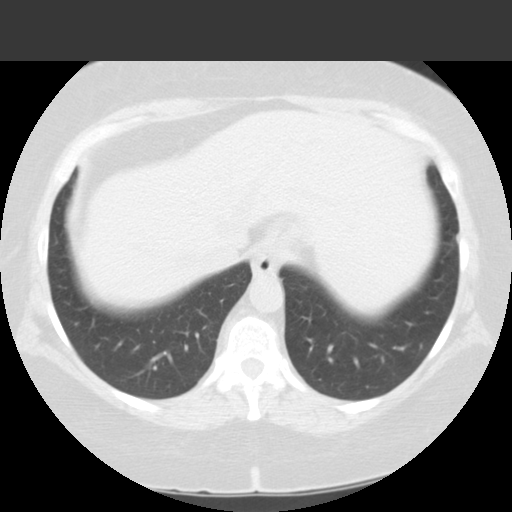
[im 26/61  mediastinal]
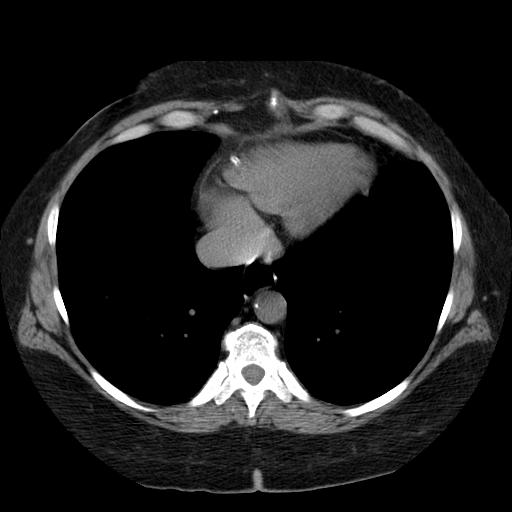
[im 26/61  lung]
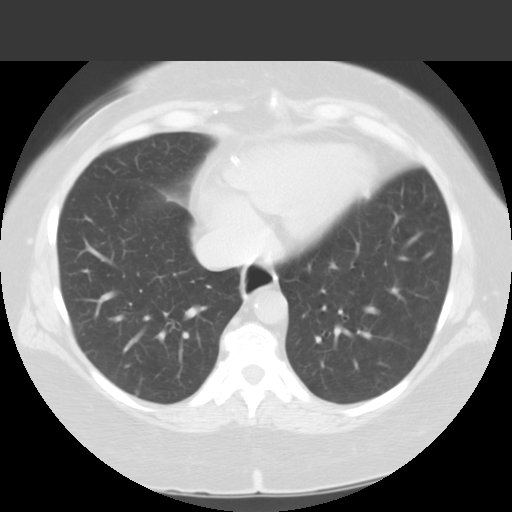
[im 31/61  lung]
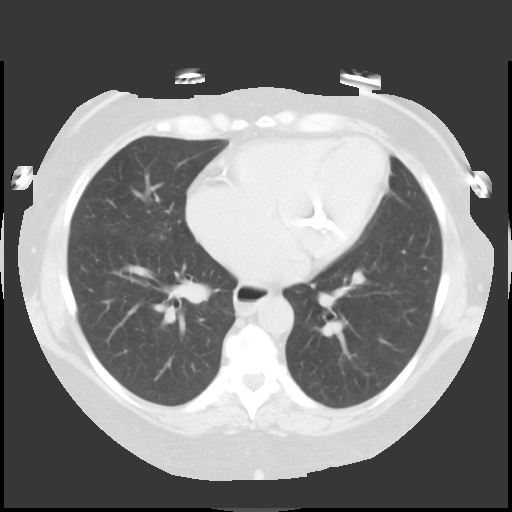
[im 36/61  lung]
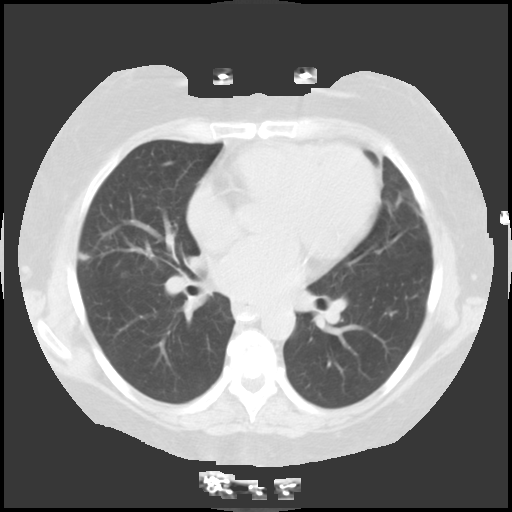
[im 41/61  lung]
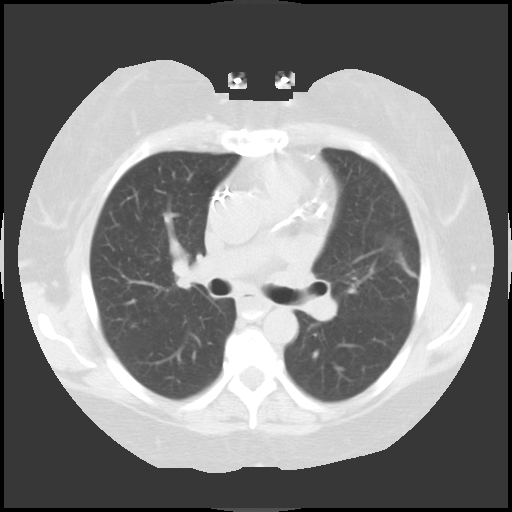
[im 46/61  mediastinal]
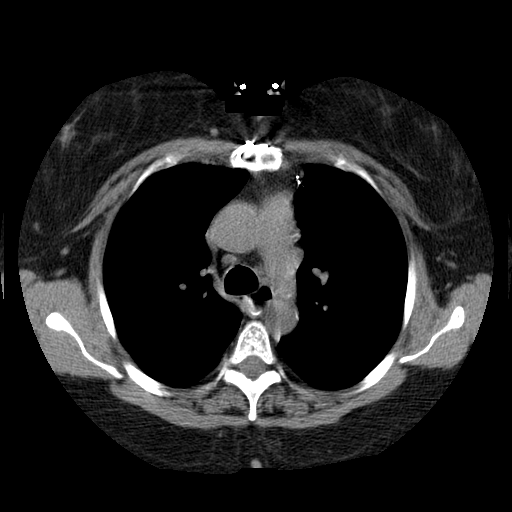
[im 46/61  lung]
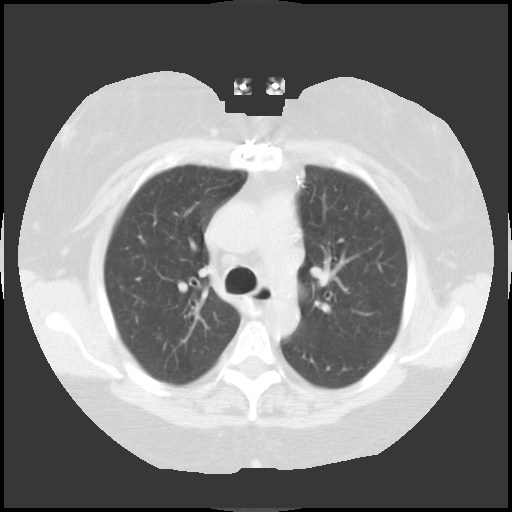
[im 51/61  lung]
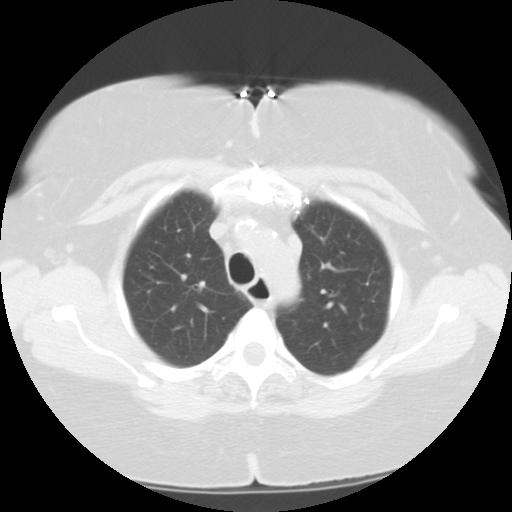
[im 56/61  lung]
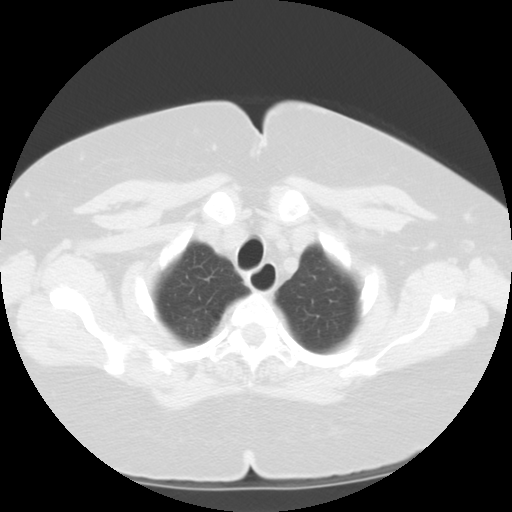

[Series 102: chest w/o · axial · non-contrast · 0.66mm/px · z∈[-191,-178]mm · 2 of 75 slices shown (2 of 2)]
[im 6/75  lung]
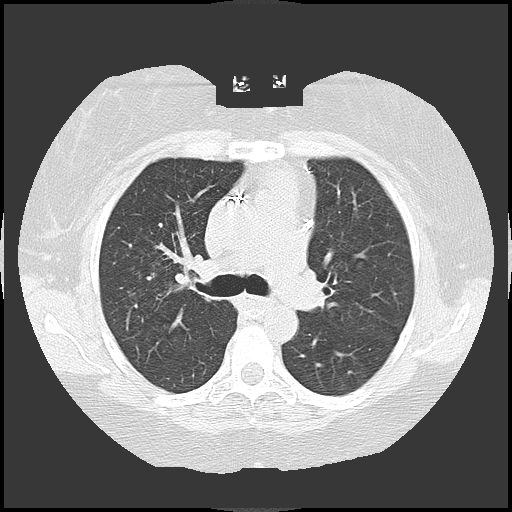
[im 16/75  lung]
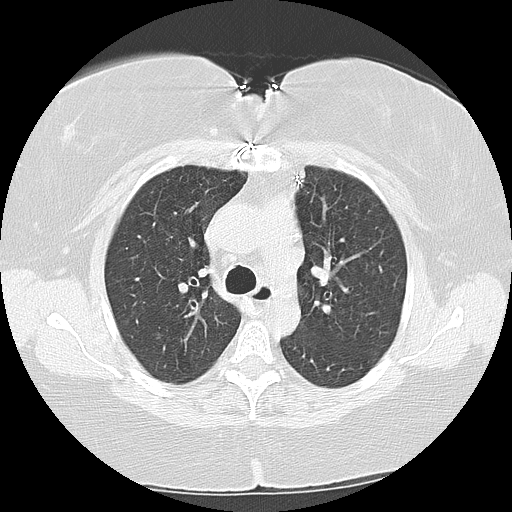

[Series 978: reformatted · coronal · 0.66mm/px · 3 of 95 slices shown]
[im 19/95  lung]
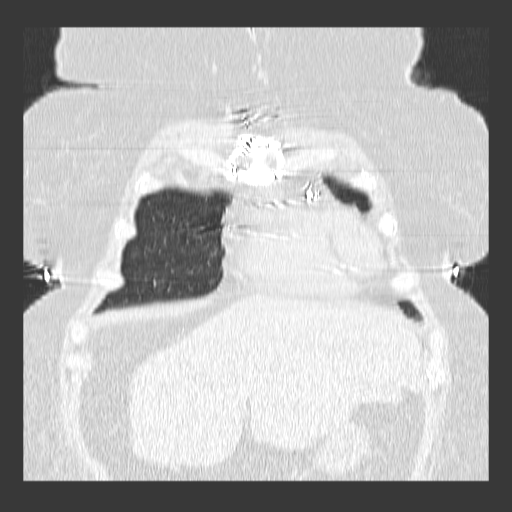
[im 38/95  lung]
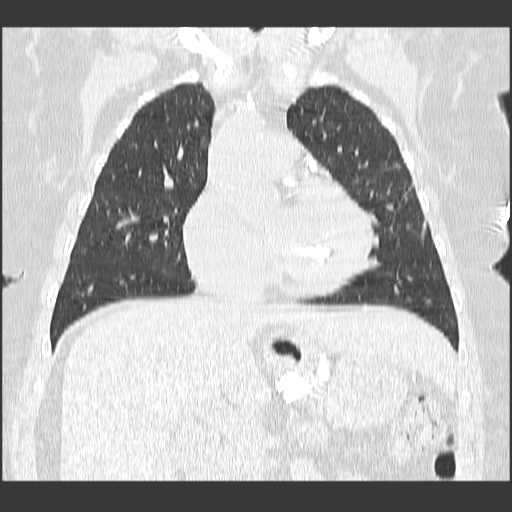
[im 57/95  lung]
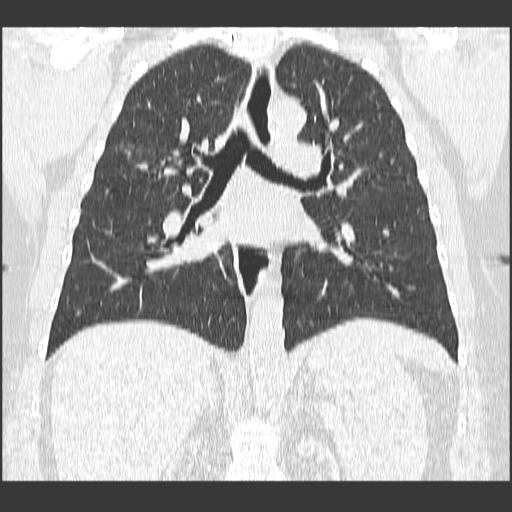

[16 of 36 positions shown; findings below may reference images not displayed]

Thyroid is enlarged and heterogeneous.  Incidental note is made of a persistent left SVC.  Mediastinal lymph nodes are small in size.  No axillary lymphadenopathy.  Esophagus is dilated throughout its course and is fluid-filled in its lower half.
Peribronchovascular nodularity in the right upper lobe is also seen on the prior.  Scar is seen in the lingula.  Previously seen diffuse peribronchovascular nodularity and ground glass in the remainder of the lungs has resolved in the interval.  No dominant pulmonary nodule.  No pleural fluid.  Airway is unremarkable.
The patient is status post lap band procedure.  The native kidneys are markedly atrophic.
IMPRESSION: 1.  No dominant nodule to account for finding on recent chest x-ray.  The majority of peribronchovascular nodularity and ground glass seen on the prior study has resolved in the interval with residual scarring in the right upper lobe. 
2.  Esophageal dilatation with fluid contained within.  Question esophageal dysmotility.

## 2007-03-15 ENCOUNTER — Ambulatory Visit: Payer: Self-pay | Admitting: Emergency Medicine

## 2007-04-29 ENCOUNTER — Ambulatory Visit: Payer: Self-pay | Admitting: Emergency Medicine

## 2007-05-18 IMAGING — CR DG CHEST 2V
2 series · 2 of 2 positions shown · non-contrast
Comparison: 02/08/06.

CLINICAL DATA: Cough and wheezing for 3 days. 
 CHEST ? 2 VIEW:

[view not recorded (1 of 2)]
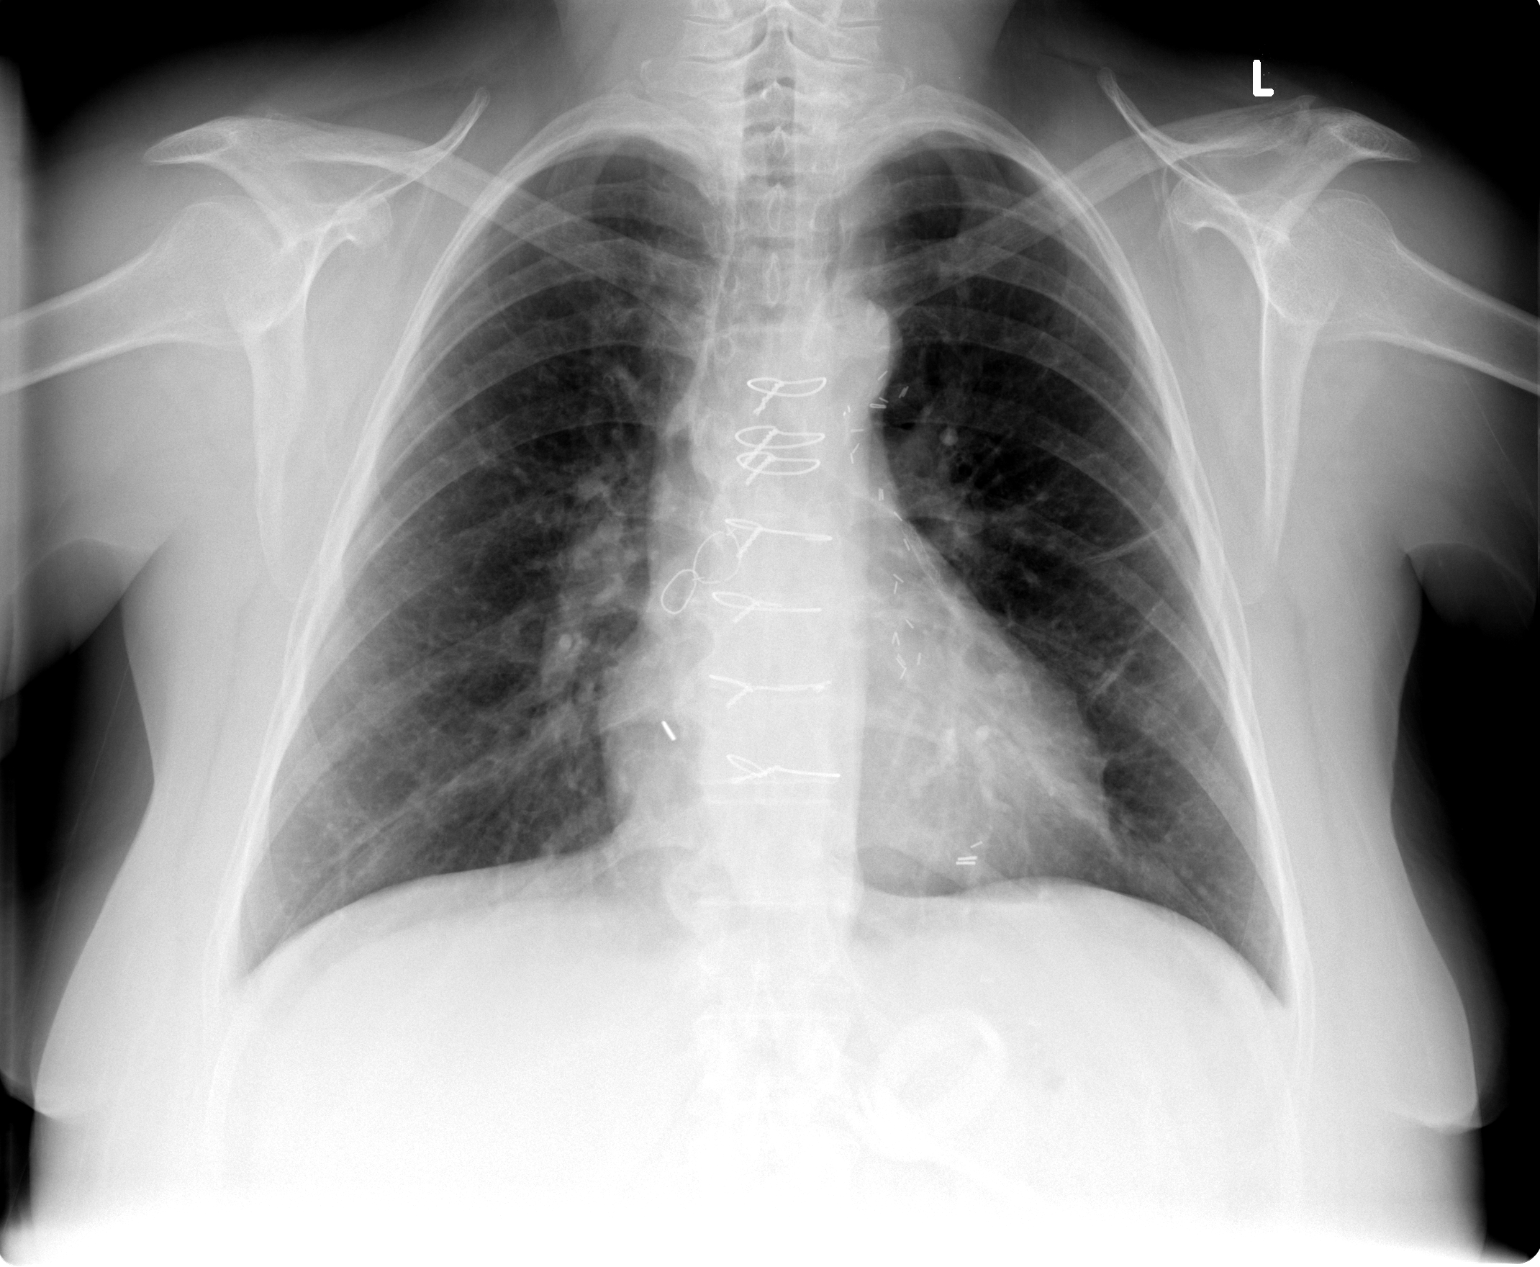

[view not recorded (2 of 2)]
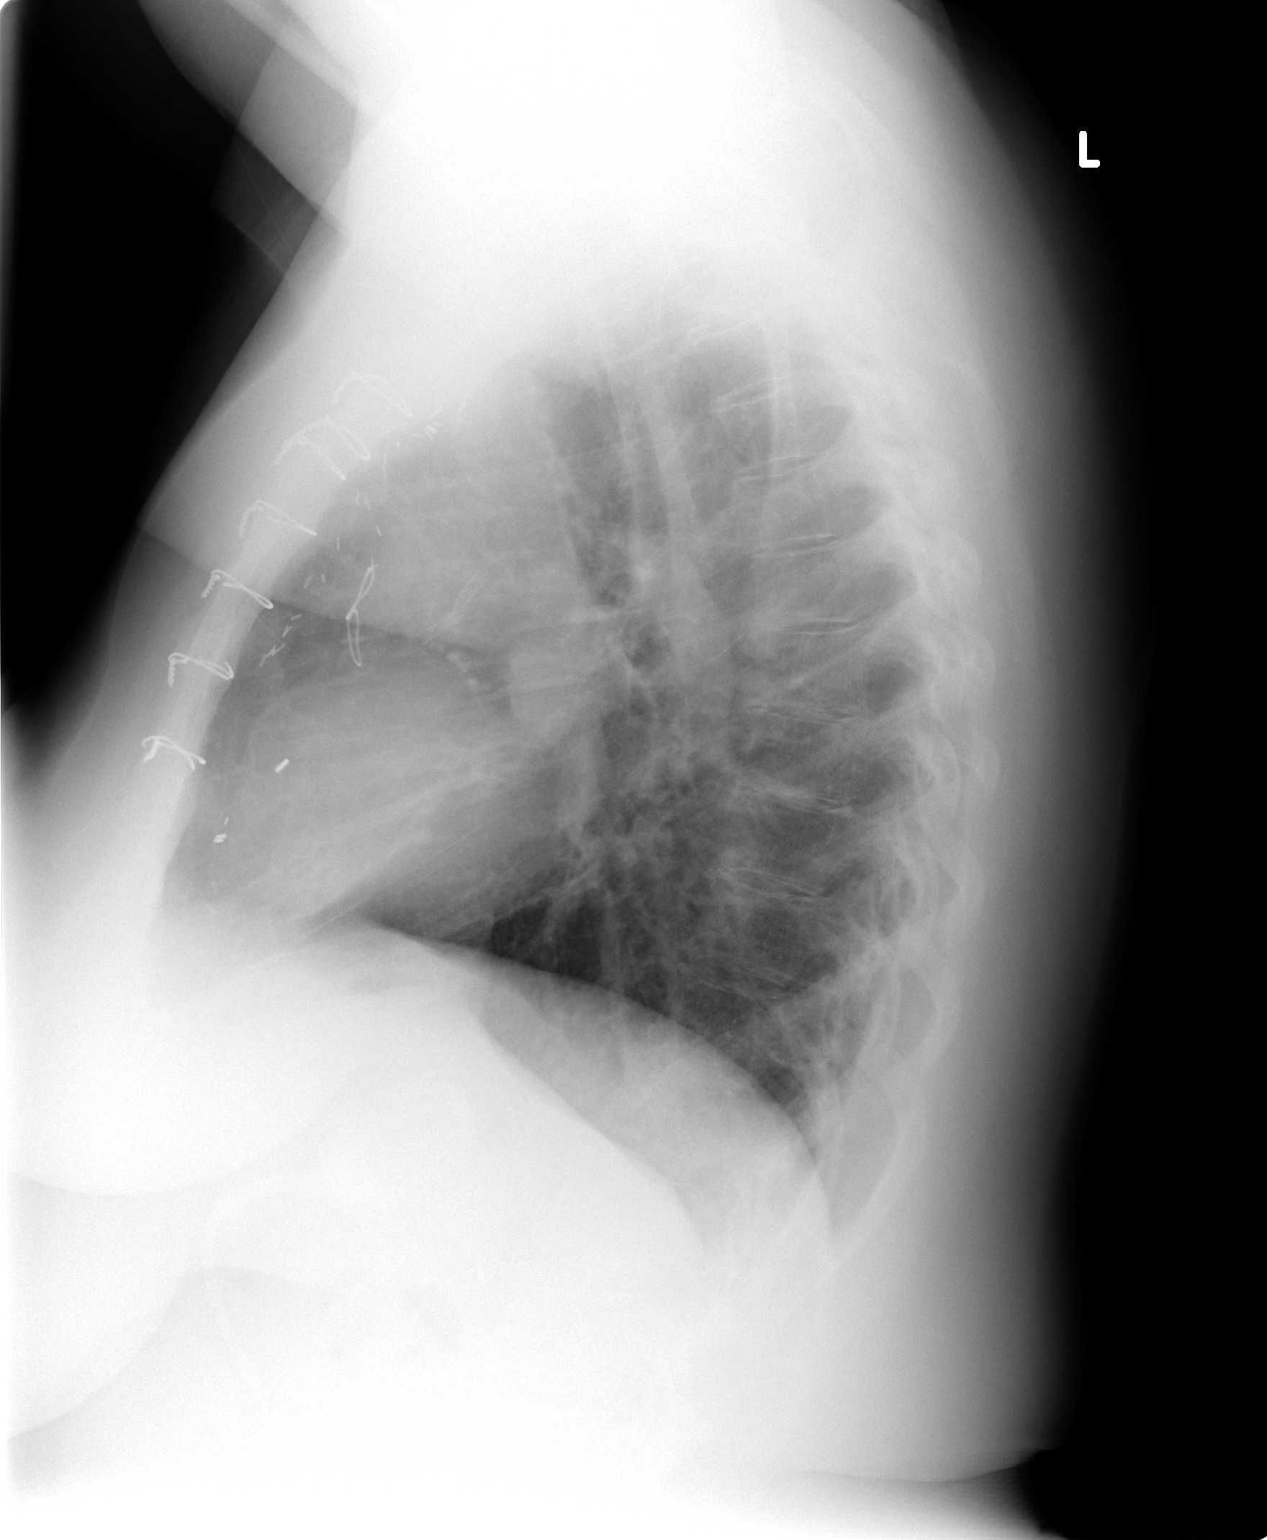

[2 of 2 positions shown; findings below may reference images not displayed]

FINDINGS: Heart size and pulmonary vascularity are normal.  There is some slight scarring in the left midzone and left base but there is no evidence of acute infiltrate or effusion.  Evidence of previous coronary artery bypass surgery as well as gastric banding procedure.  Coronary artery stents are visible.
IMPRESSION: No acute disease in the chest.

## 2007-06-16 IMAGING — US US ABDOMEN COMPLETE
1 series · 14 of 25 positions shown · non-contrast
Comparison: 02/08/05

CLINICAL DATA: Abdominal pain.   Nausea and vomiting.   History of renal transplant with diabetes.
 ABDOMEN ULTRASOUND:
TECHNIQUE: Complete abdominal ultrasound examination was performed including evaluation of the liver, gallbladder, bile ducts, pancreas, kidneys, spleen, IVC, and abdominal aorta.

[Series 1: unknown · 0.25mm/px · 14 of 88 slices shown]
[im 1/88]
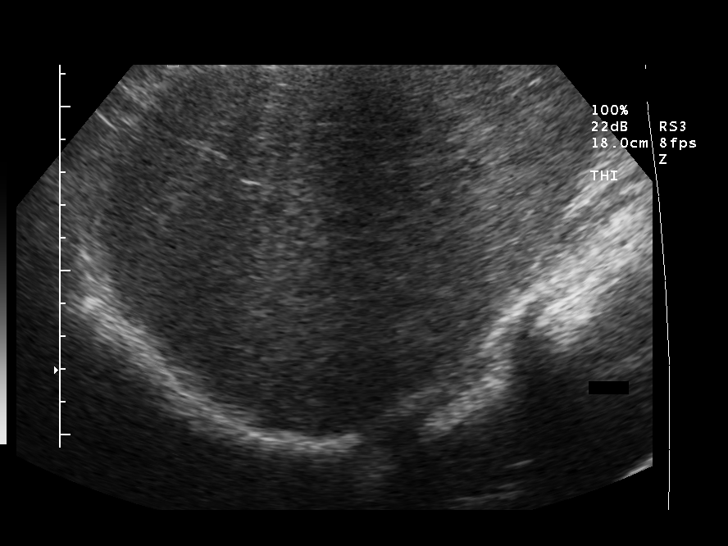
[im 8/88]
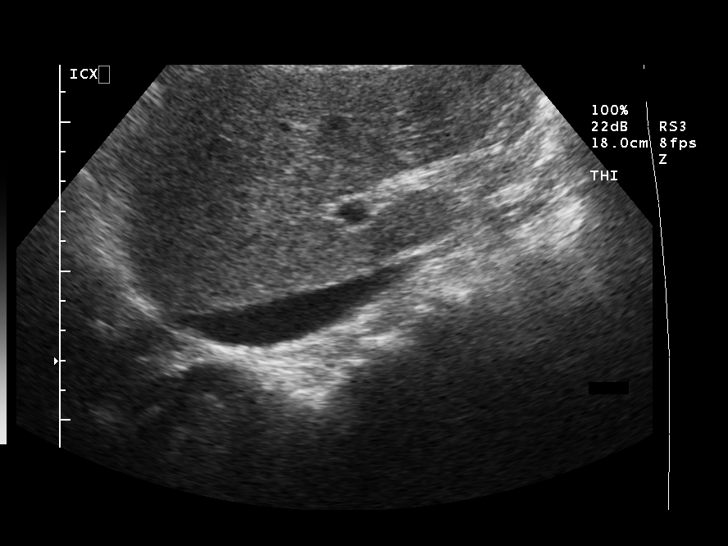
[im 15/88]
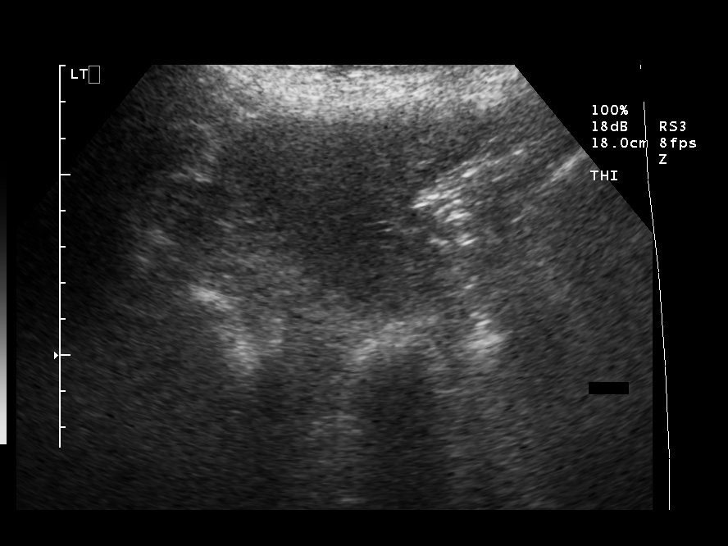
[im 22/88]
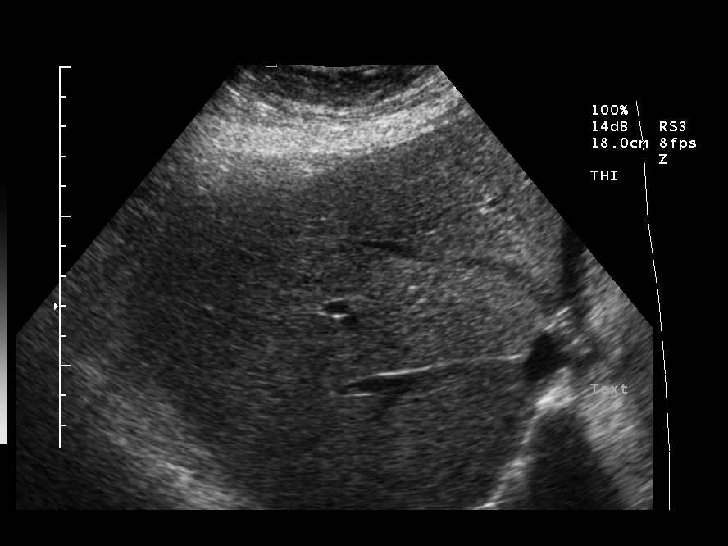
[im 30/88]
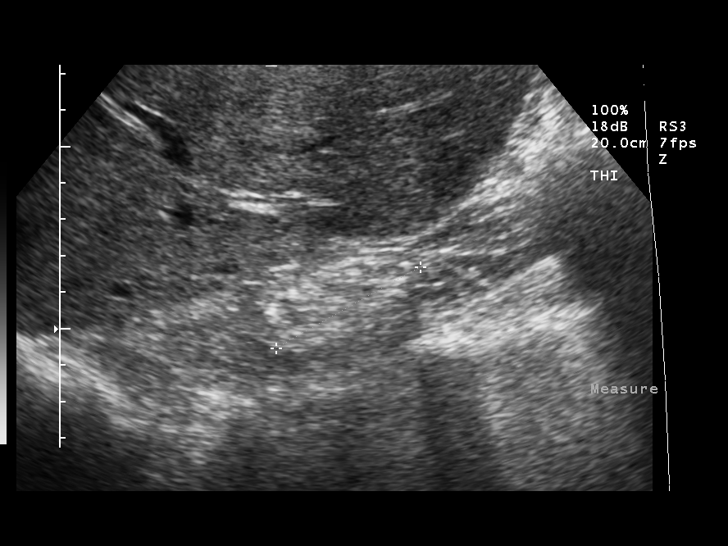
[im 33/88]
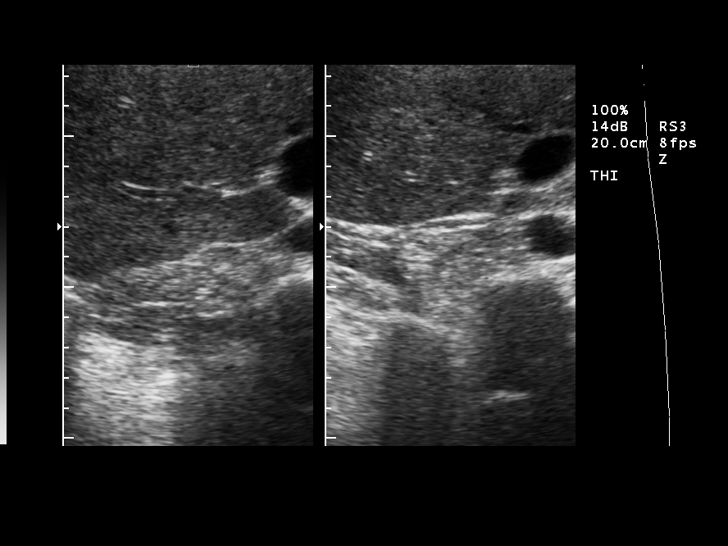
[im 40/88]
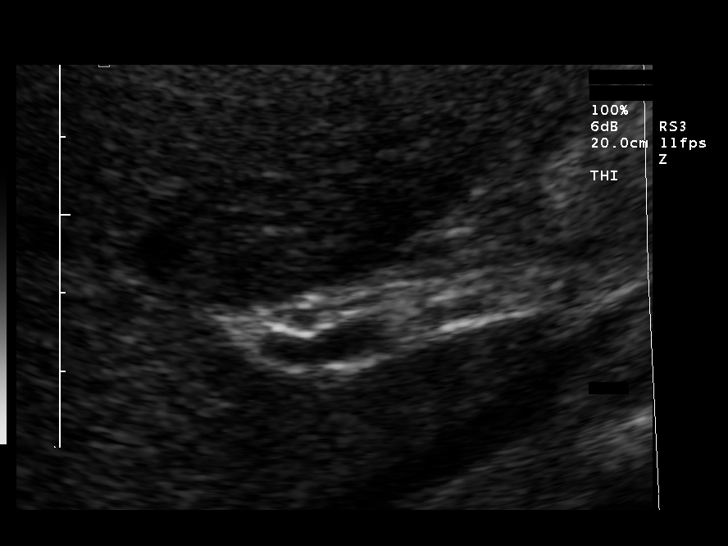
[im 48/88]
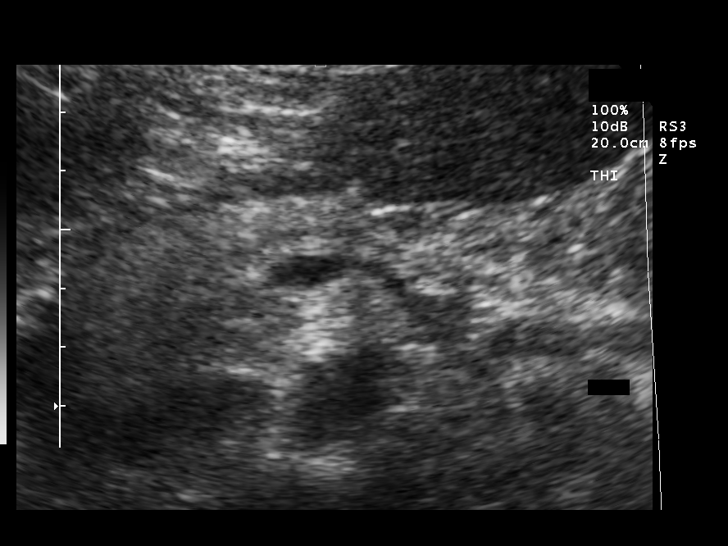
[im 55/88]
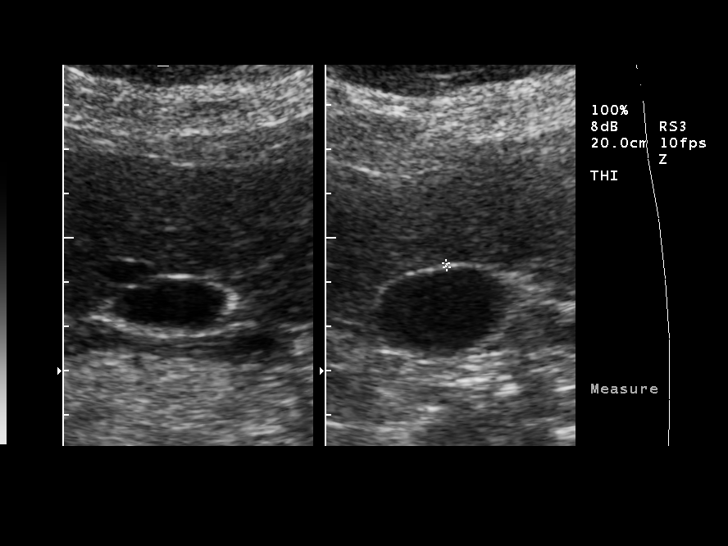
[im 59/88]
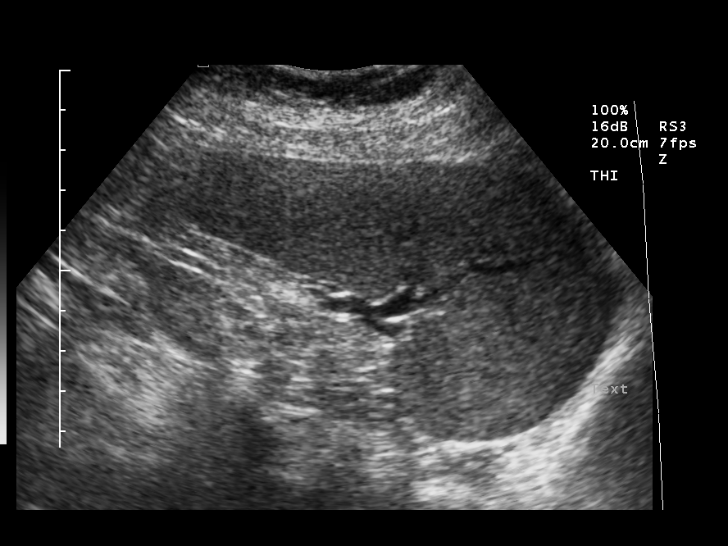
[im 66/88]
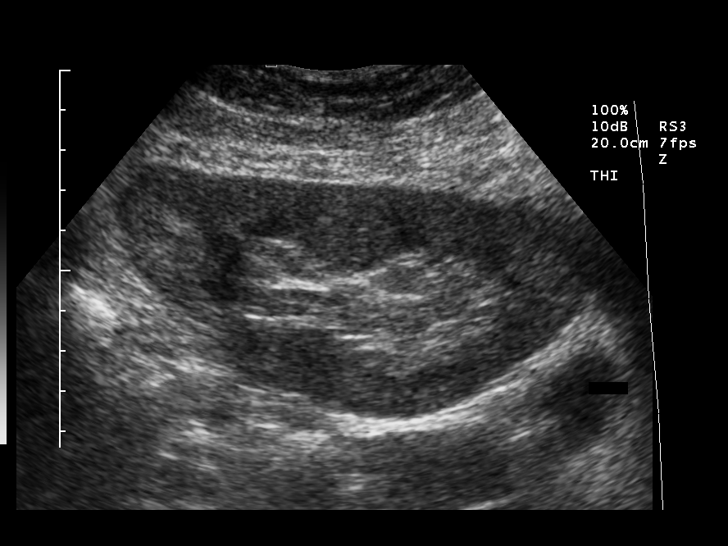
[im 73/88]
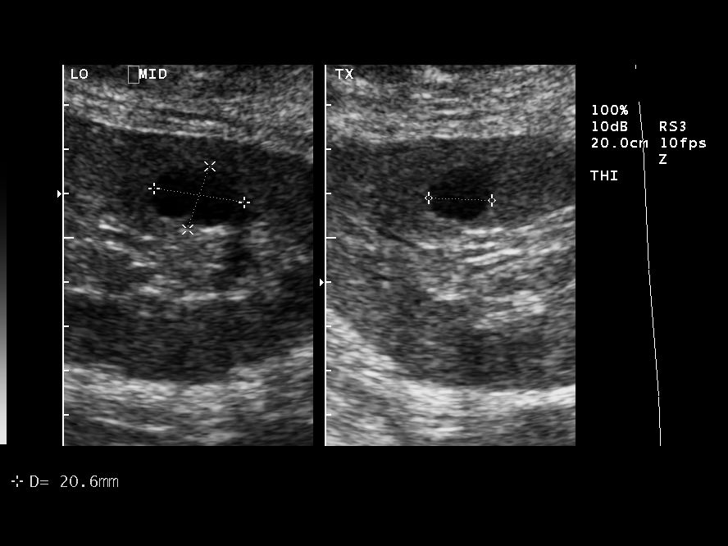
[im 80/88]
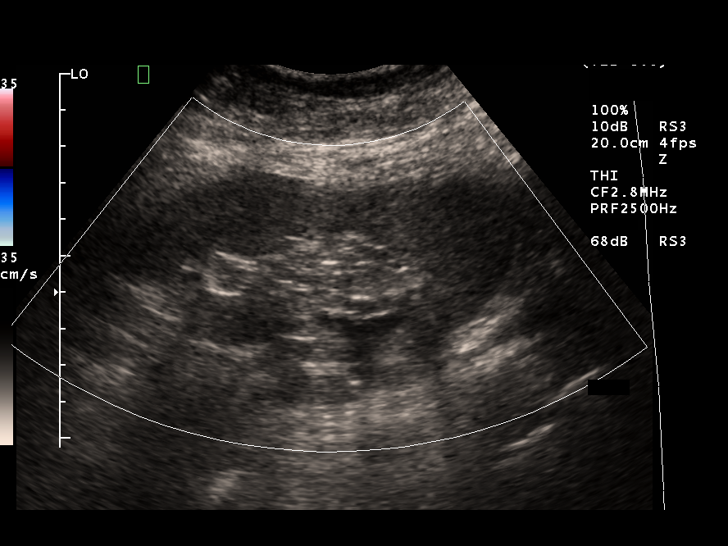
[im 88/88]
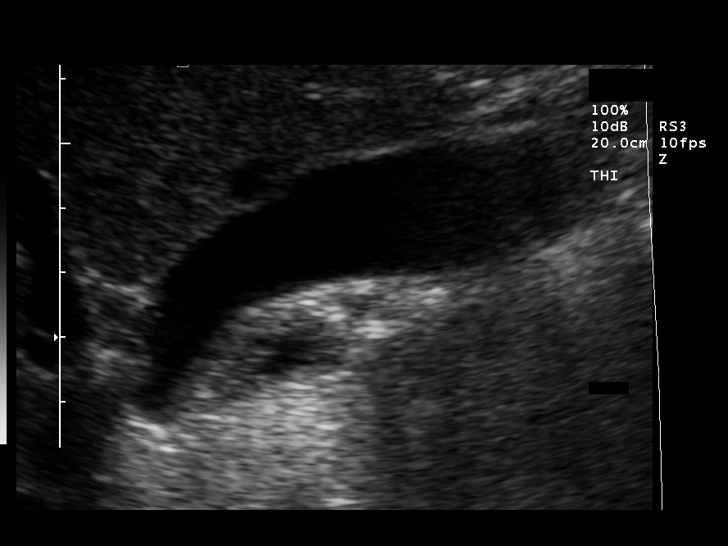

[14 of 25 positions shown; findings below may reference images not displayed]

FINDINGS: Gallbladder, common bile duct, IVC, and aorta are unremarkable.  The liver and spleen are upper limits of normal in size and unchanged.  No focal hepatic lesions are identified.  The IVC and pancreas are not well-visualized, but are grossly unremarkable.  Atrophic kidneys bilaterally are unremarkable.  Left pelvic renal transplant appears unchanged with vascular flow.  Transplant renal cysts are noted.
IMPRESSION: 1.  No acute abnormality.
 2.  Stable upper limits of normal liver and spleen size. 
 3.  Stable left pelvic renal transplant and atrophic native kidneys. 
 4.  Limited evaluation of the IVC and pancreas.

## 2007-06-17 ENCOUNTER — Ambulatory Visit: Payer: Self-pay | Admitting: Emergency Medicine

## 2007-06-30 ENCOUNTER — Emergency Department (HOSPITAL_COMMUNITY): Admission: EM | Admit: 2007-06-30 | Discharge: 2007-06-30 | Payer: Self-pay | Admitting: Emergency Medicine

## 2007-06-30 IMAGING — CR DG CHEST 2V
2 series · 2 of 2 positions shown · non-contrast
Comparison: 05/04/2006

CLINICAL DATA: Cough. Chest congestion. History of renal transplant and prior
CABG.

CHEST - 2 VIEW

[w chest pa]
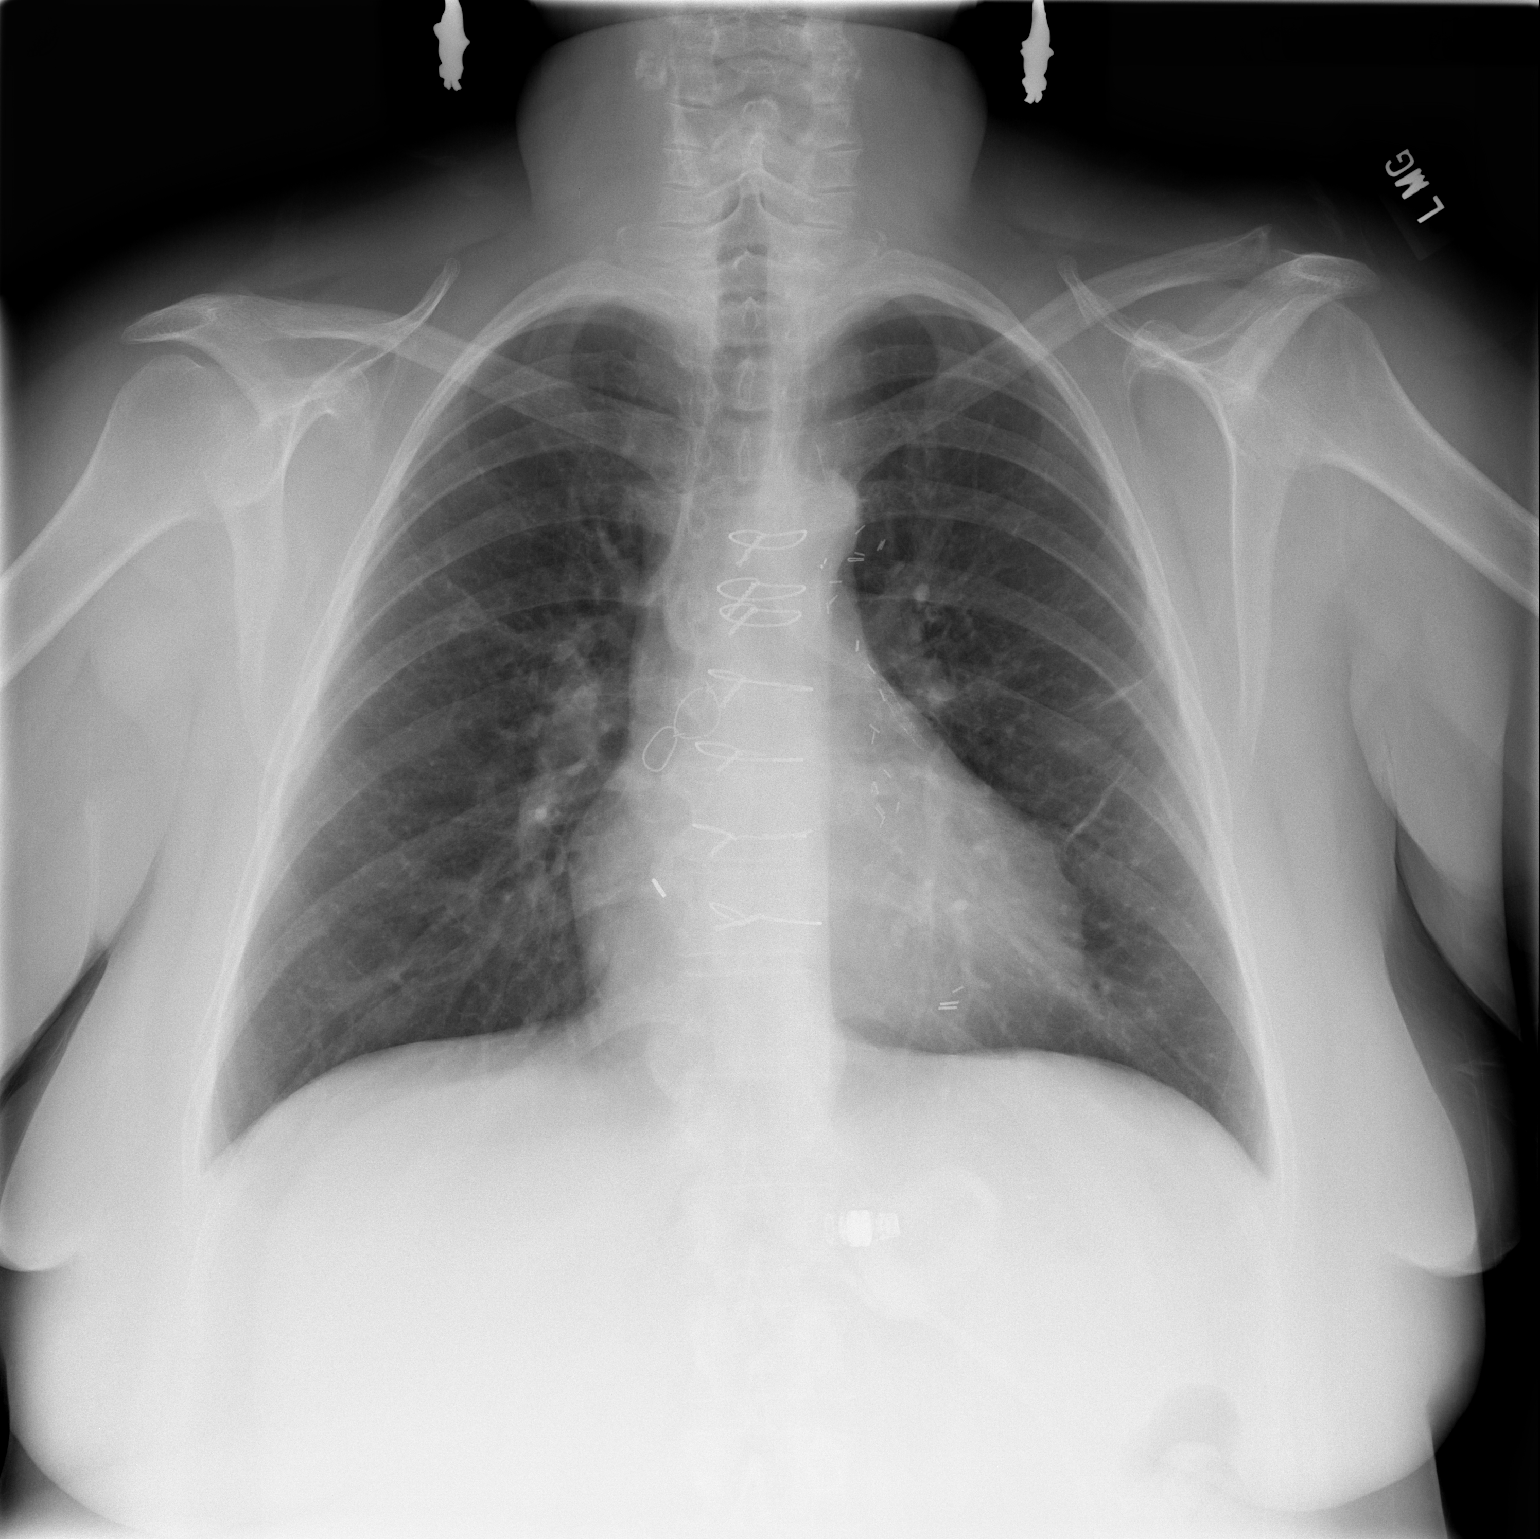

[w chest lat]
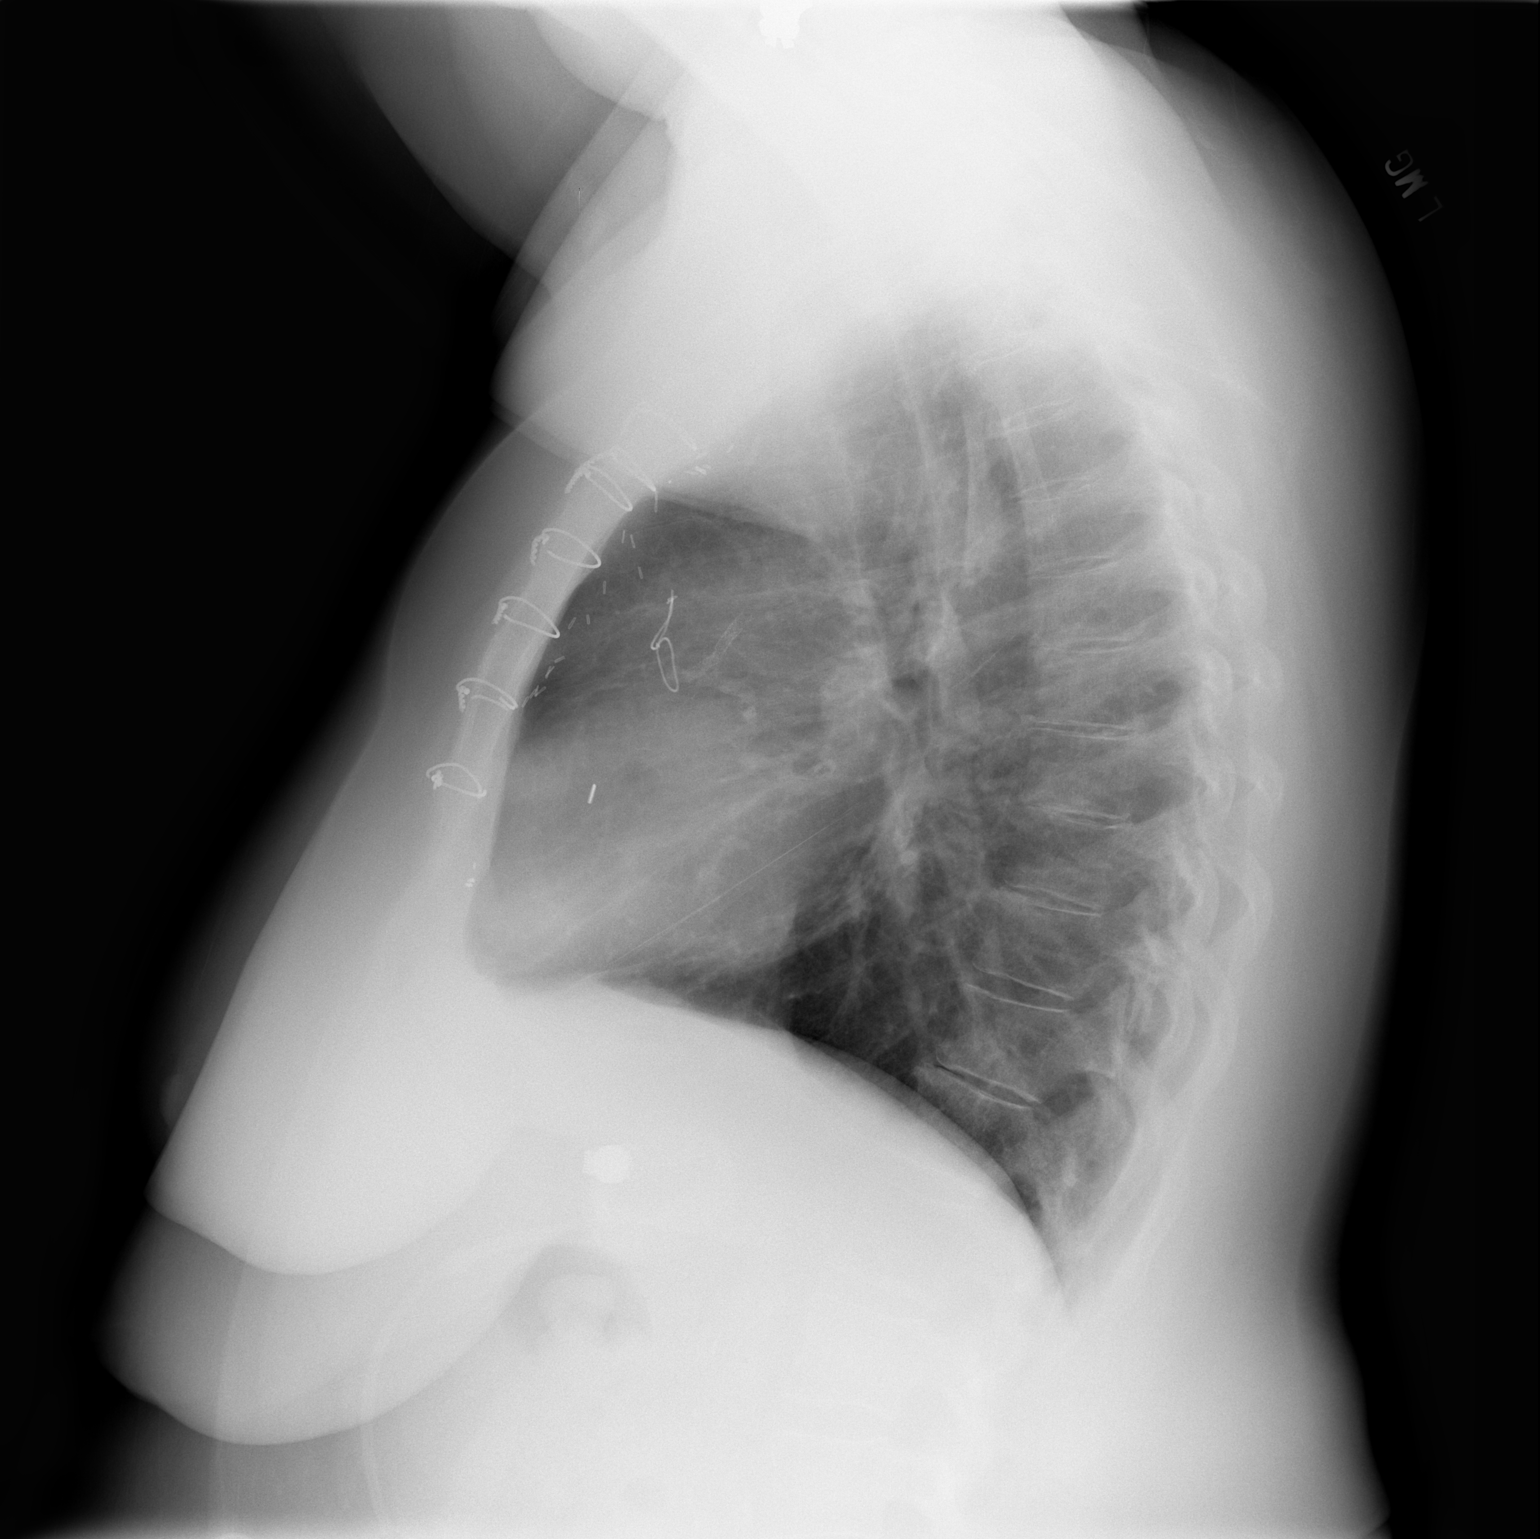

[2 of 2 positions shown; findings below may reference images not displayed]

FINDINGS: Prior CABG noted. The heart and mediastinum appear otherwise
unremarkable. Linear scarring in the left midlung noted. This likely also some
mild scarring in the right midlung. Vague density the right lung base which
represent superimposition of vascular and osseous shadows.

Gastric banding noted. There may be a small hiatal hernia.

IMPRESSION

1. Stable scarring. No acute radiographic findings.

## 2007-07-20 ENCOUNTER — Encounter: Admission: RE | Admit: 2007-07-20 | Discharge: 2007-07-20 | Payer: Self-pay | Admitting: Nephrology

## 2007-09-09 DIAGNOSIS — K219 Gastro-esophageal reflux disease without esophagitis: Secondary | ICD-10-CM | POA: Insufficient documentation

## 2007-09-09 DIAGNOSIS — J329 Chronic sinusitis, unspecified: Secondary | ICD-10-CM | POA: Insufficient documentation

## 2007-09-09 DIAGNOSIS — J45909 Unspecified asthma, uncomplicated: Secondary | ICD-10-CM

## 2007-09-09 DIAGNOSIS — R059 Cough, unspecified: Secondary | ICD-10-CM | POA: Insufficient documentation

## 2007-09-09 DIAGNOSIS — R05 Cough: Secondary | ICD-10-CM

## 2007-09-09 DIAGNOSIS — G4730 Sleep apnea, unspecified: Secondary | ICD-10-CM

## 2007-09-12 ENCOUNTER — Ambulatory Visit: Payer: Self-pay | Admitting: Emergency Medicine

## 2007-09-12 DIAGNOSIS — H60509 Unspecified acute noninfective otitis externa, unspecified ear: Secondary | ICD-10-CM

## 2007-09-26 IMAGING — CR DG CHEST 2V
2 series · 2 of 2 positions shown · non-contrast
Comparison: none

CLINICAL DATA: Nausea, vomiting, cough, fever

Chest 2 view:
Comparison 06/16/2006. Stable linear scarring or atelectasis in the right upper
lung and lingula. Previous CABG. Heart size normal. No effusion. No confluent
airspace infiltrate.

[w chest pa]
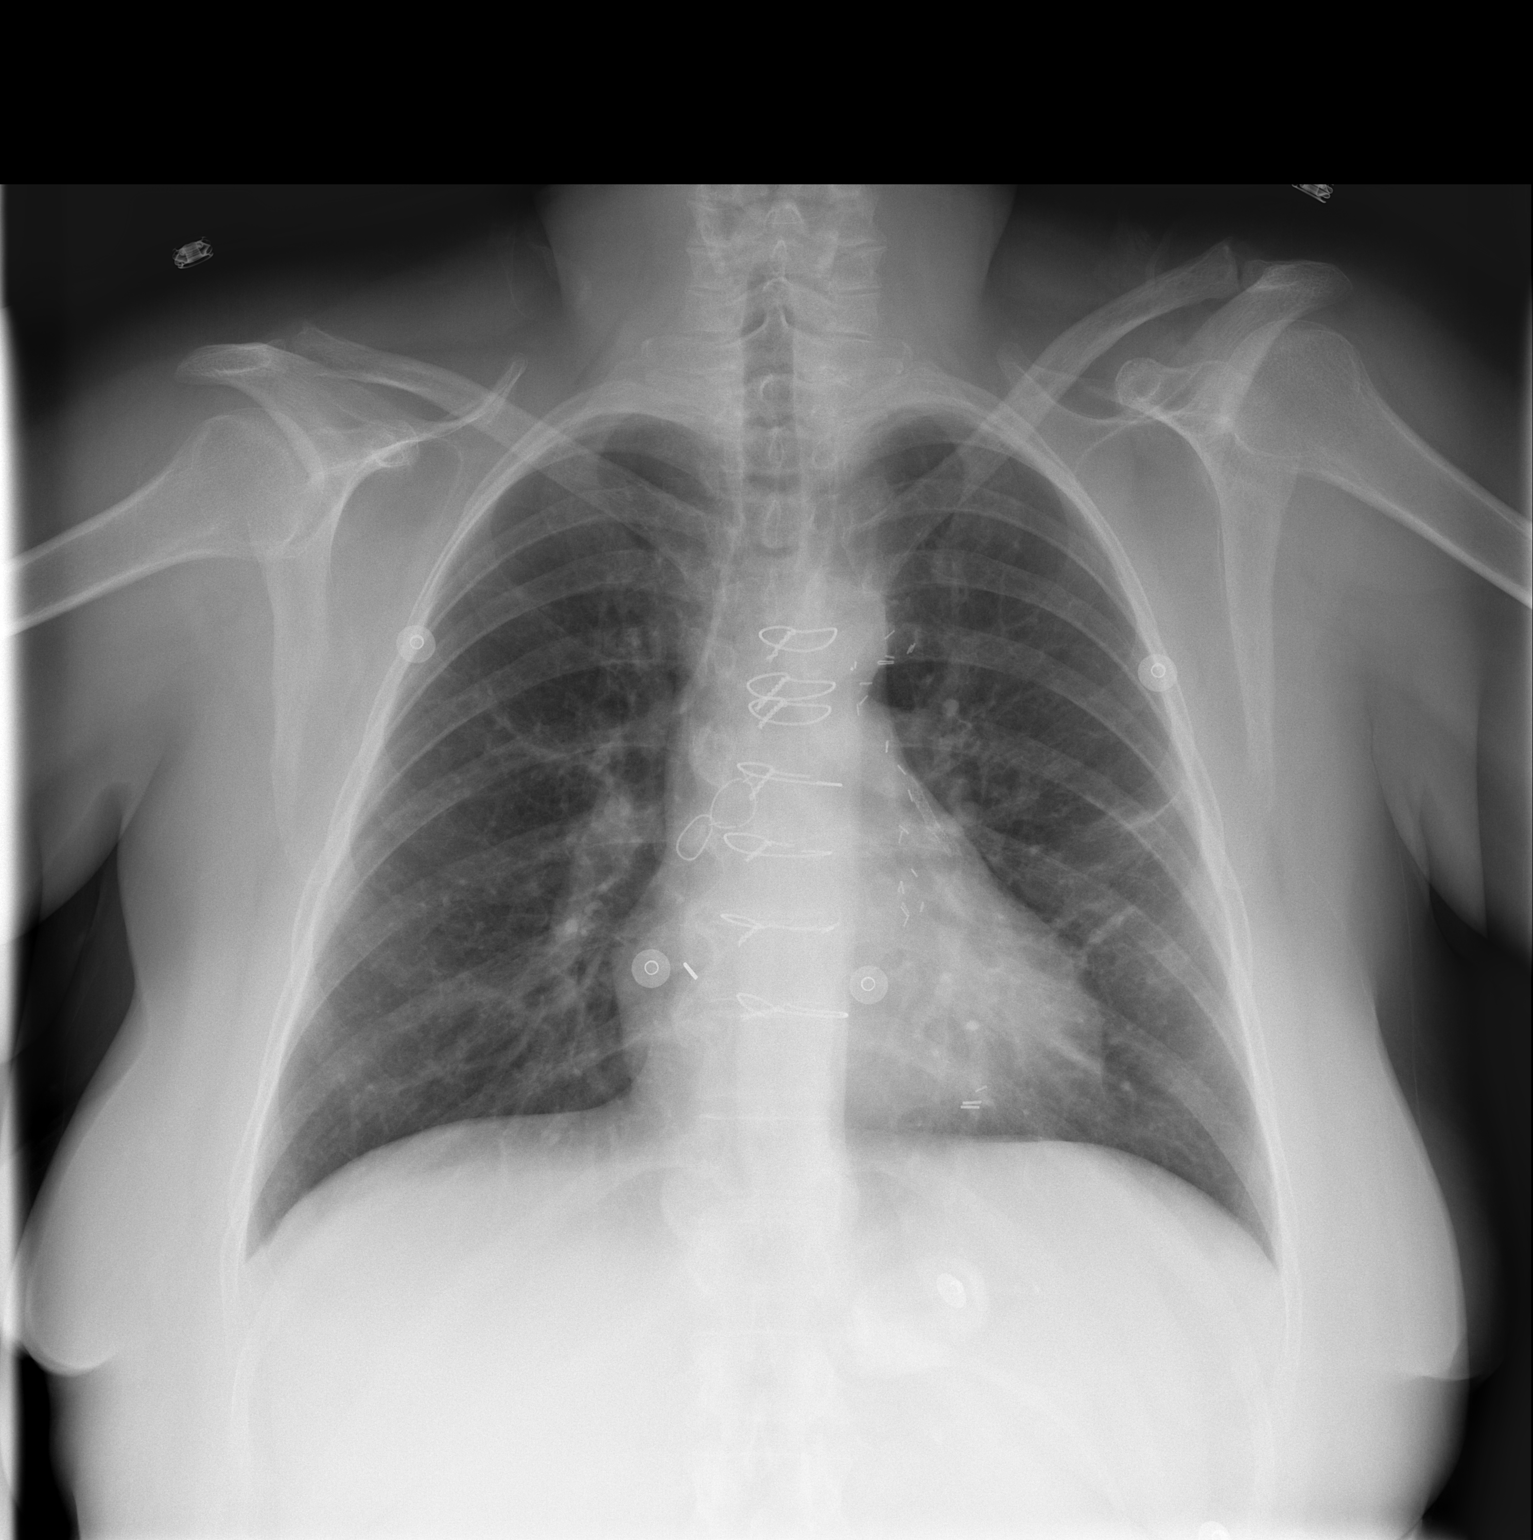

[w chest lat]
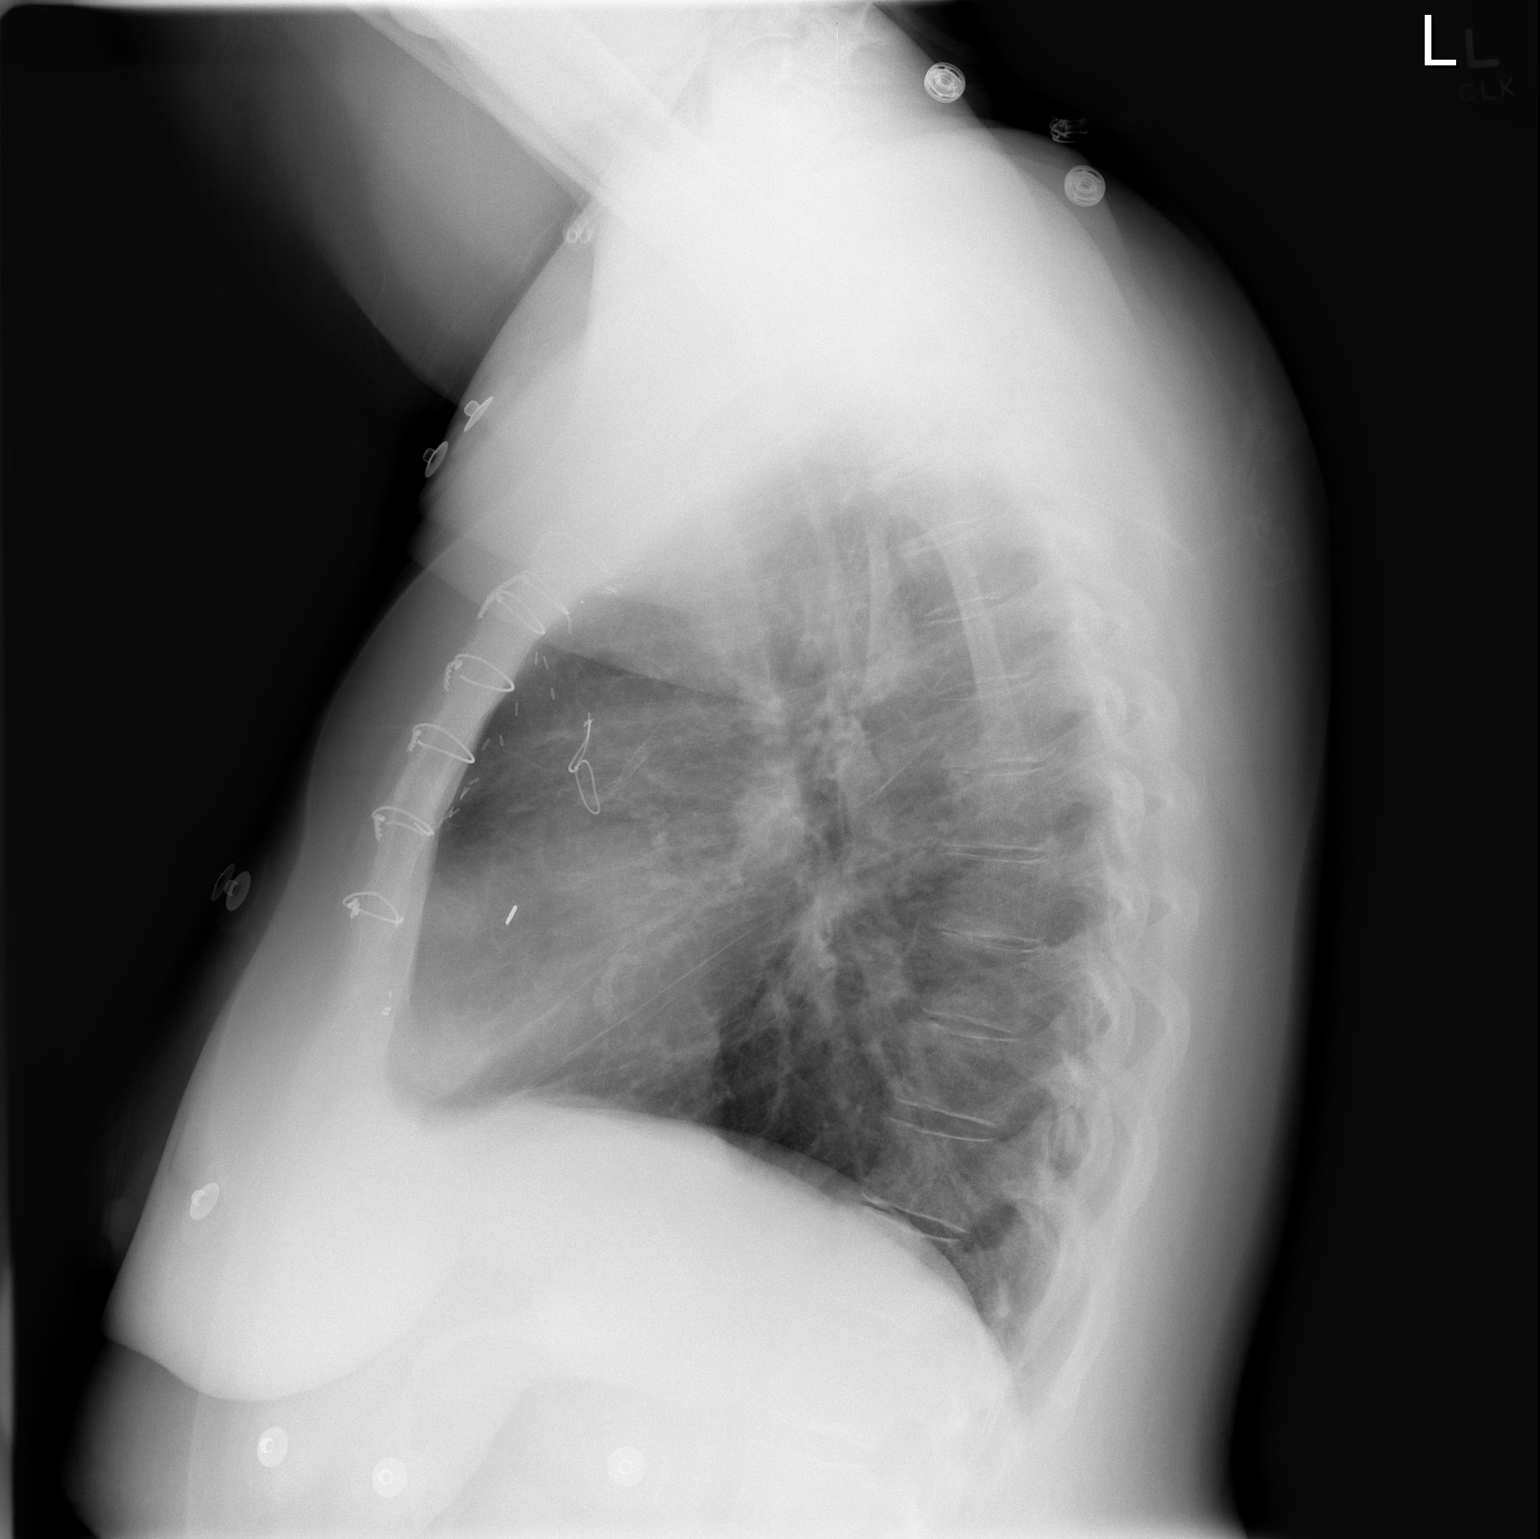

[2 of 2 positions shown; findings below may reference images not displayed]

IMPRESSION: 1. No acute disease.
2. Stable postop changes and scarring.

## 2007-12-07 ENCOUNTER — Encounter (INDEPENDENT_AMBULATORY_CARE_PROVIDER_SITE_OTHER): Payer: Self-pay | Admitting: *Deleted

## 2007-12-12 ENCOUNTER — Encounter: Admission: RE | Admit: 2007-12-12 | Discharge: 2007-12-12 | Payer: Self-pay | Admitting: General Surgery

## 2007-12-15 ENCOUNTER — Encounter: Payer: Self-pay | Admitting: Emergency Medicine

## 2008-02-01 ENCOUNTER — Telehealth: Payer: Self-pay | Admitting: Emergency Medicine

## 2008-02-06 ENCOUNTER — Ambulatory Visit: Payer: Self-pay | Admitting: Pulmonary Disease

## 2008-02-08 IMAGING — CR DG CHEST 2V
2 series · 2 of 2 positions shown · non-contrast
Comparison: none

HISTORY: Cough, asthma

[view not recorded (1 of 2)]
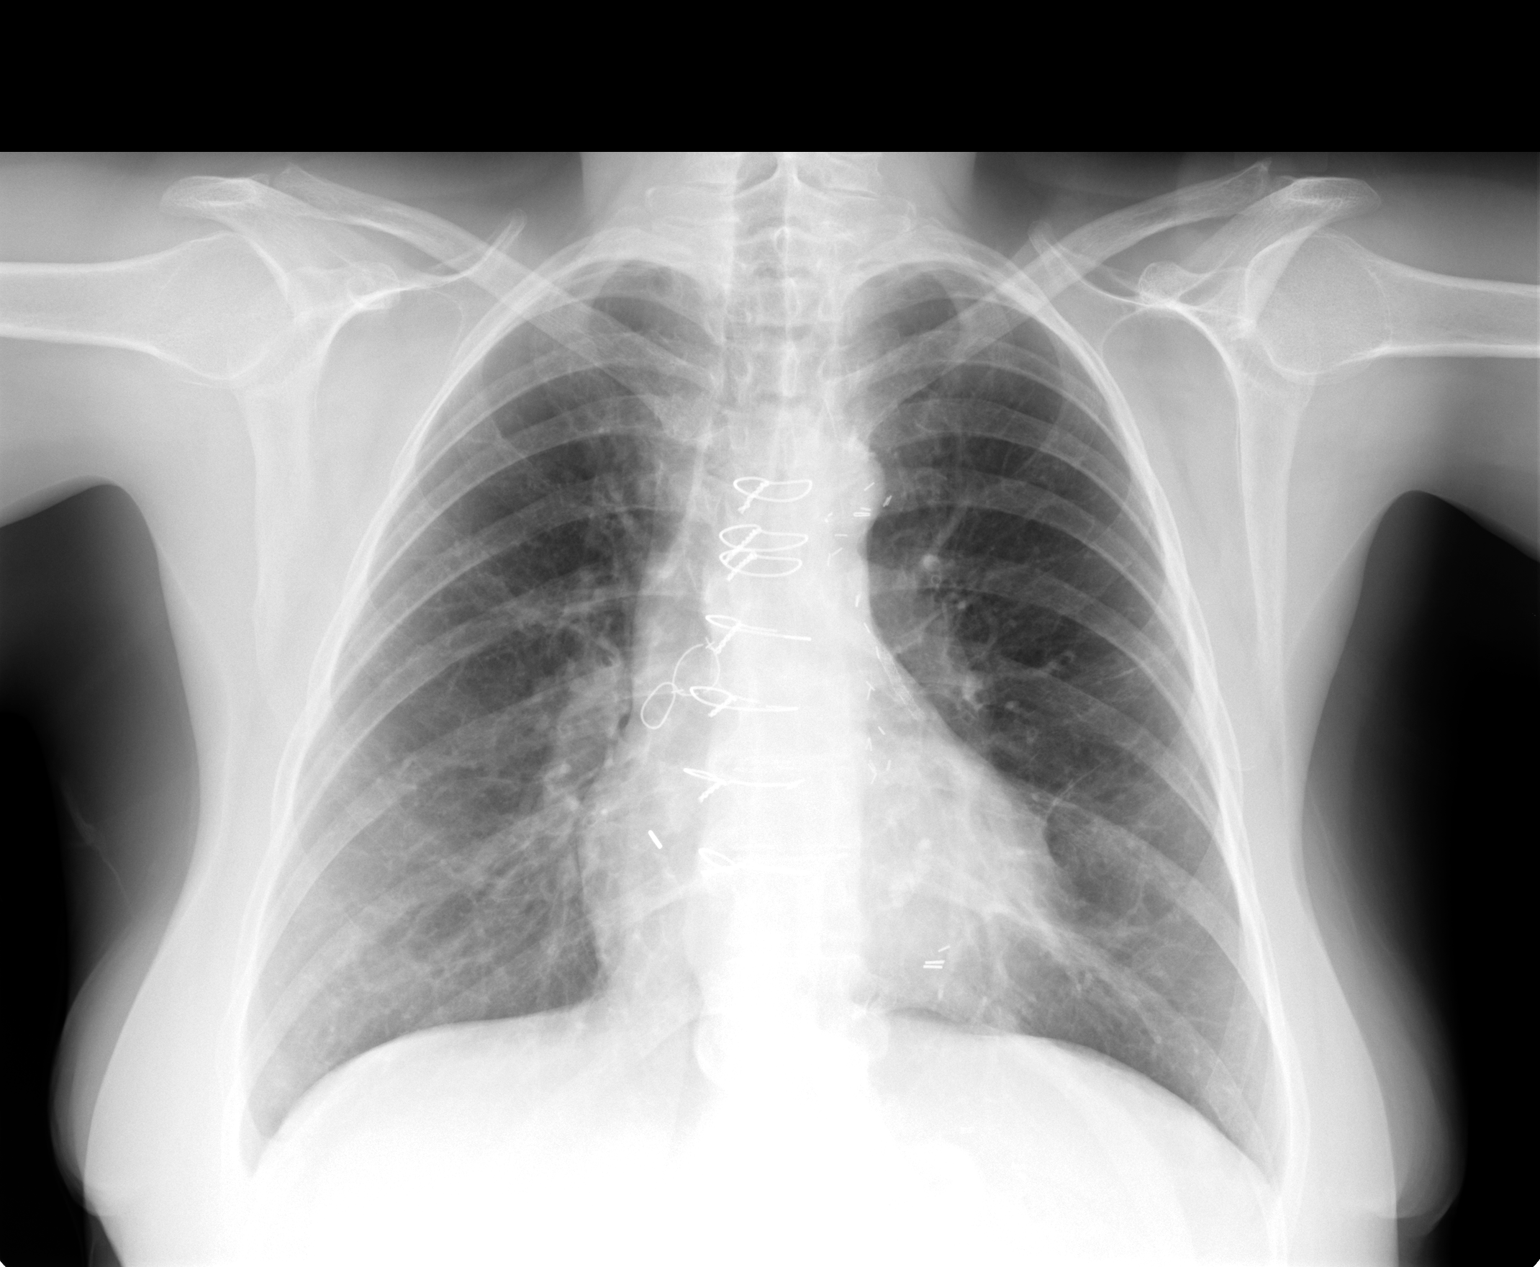

[view not recorded (2 of 2)]
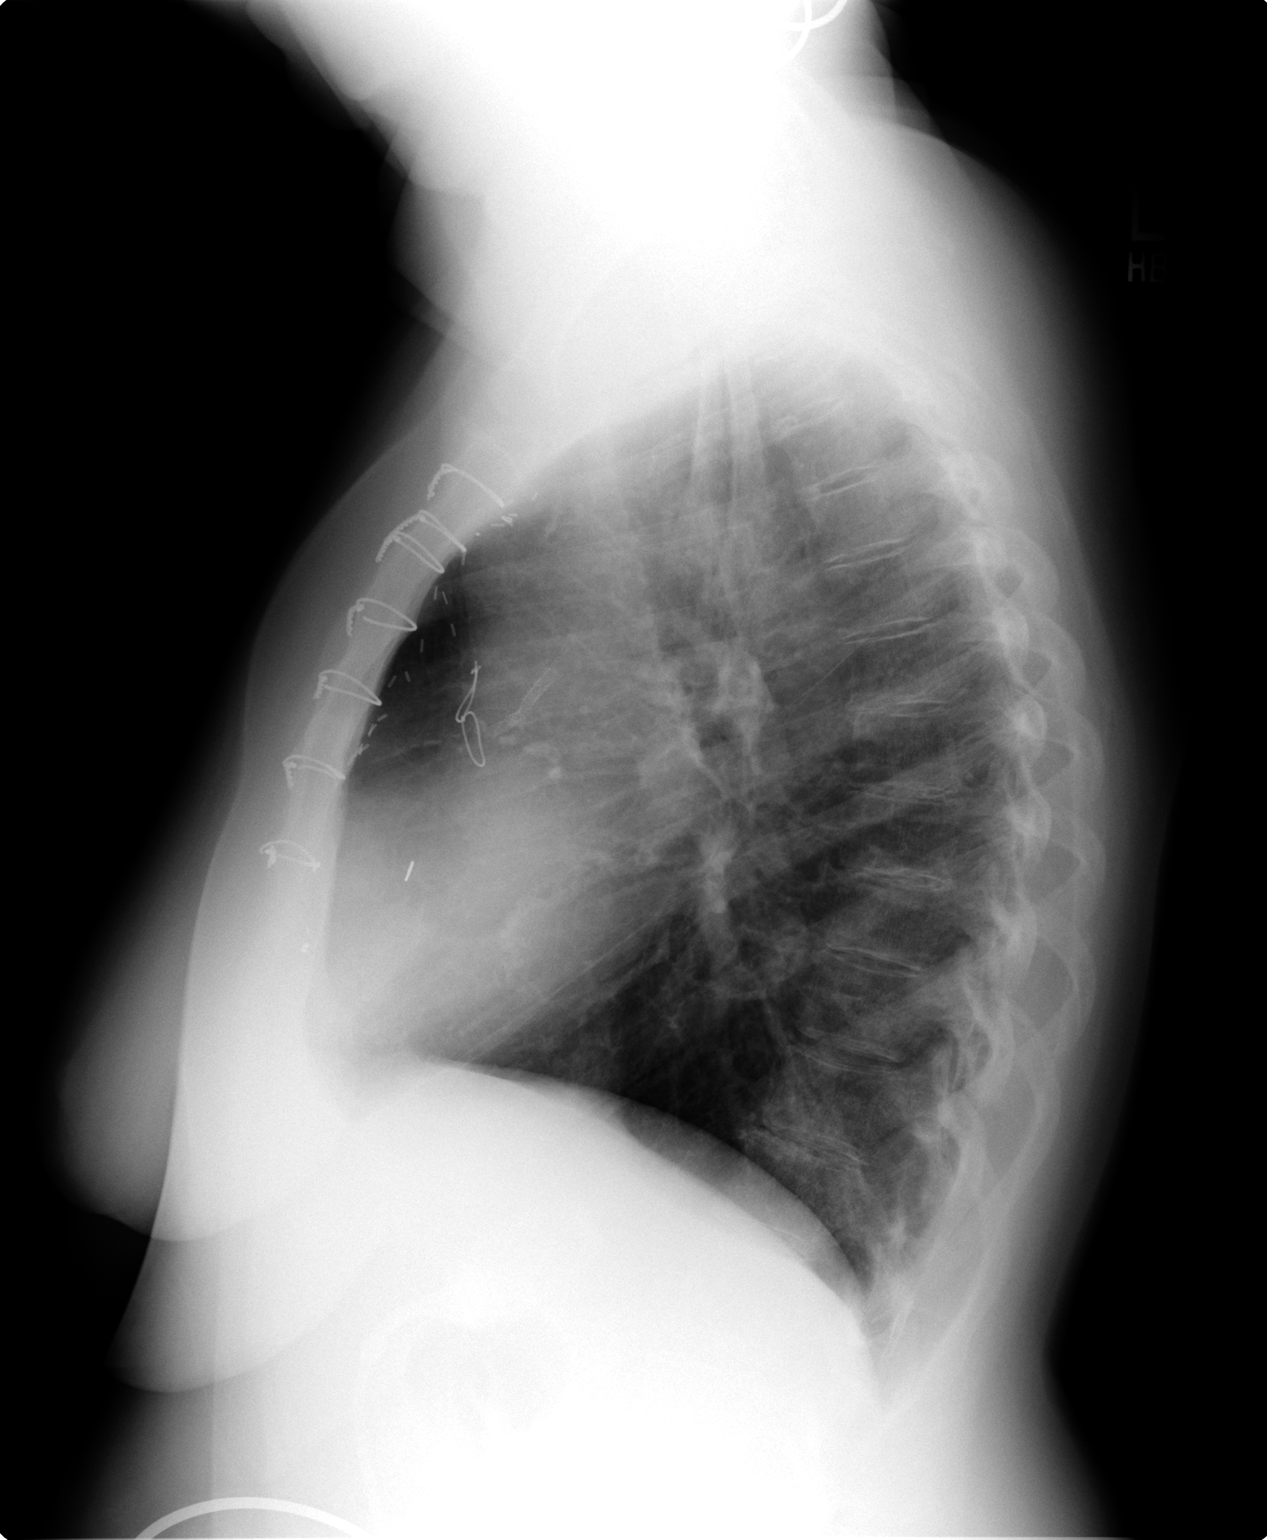

[2 of 2 positions shown; findings below may reference images not displayed]

CHEST 2 VIEWS:

Comparison 09/12/2006

Normal heart size status post CABG.
Normal mediastinal contours and pulmonary vascularity.
Chronic peribronchial thickening with scarring left midlung and at left lower
chest.
No acute infiltrate or effusion.
Mild chronic interstitial prominence stable.
Coronary arterial stent noted at expected position of left coronary system.
No acute bone lesions.
IMPRESSION: Status post CABG and PTCA/stenting.
Chronic bronchitic interstitial changes with scarring left midlung.
No acute abnormalities.

## 2008-03-14 ENCOUNTER — Ambulatory Visit: Payer: Self-pay | Admitting: Emergency Medicine

## 2008-03-14 DIAGNOSIS — J069 Acute upper respiratory infection, unspecified: Secondary | ICD-10-CM | POA: Insufficient documentation

## 2008-03-15 ENCOUNTER — Telehealth: Payer: Self-pay | Admitting: Emergency Medicine

## 2008-03-28 ENCOUNTER — Telehealth (INDEPENDENT_AMBULATORY_CARE_PROVIDER_SITE_OTHER): Payer: Self-pay | Admitting: *Deleted

## 2008-05-23 ENCOUNTER — Ambulatory Visit (HOSPITAL_COMMUNITY): Admission: RE | Admit: 2008-05-23 | Discharge: 2008-05-23 | Payer: Self-pay | Admitting: General Surgery

## 2008-06-04 ENCOUNTER — Ambulatory Visit: Payer: Self-pay | Admitting: Pulmonary Disease

## 2008-06-04 ENCOUNTER — Inpatient Hospital Stay (HOSPITAL_COMMUNITY): Admission: EM | Admit: 2008-06-04 | Discharge: 2008-06-07 | Payer: Self-pay | Admitting: Emergency Medicine

## 2008-06-29 ENCOUNTER — Encounter: Admission: RE | Admit: 2008-06-29 | Discharge: 2008-06-29 | Payer: Self-pay | Admitting: Cardiology

## 2008-07-03 ENCOUNTER — Encounter (INDEPENDENT_AMBULATORY_CARE_PROVIDER_SITE_OTHER): Payer: Self-pay | Admitting: Cardiology

## 2008-07-03 ENCOUNTER — Ambulatory Visit (HOSPITAL_COMMUNITY): Admission: RE | Admit: 2008-07-03 | Discharge: 2008-07-03 | Payer: Self-pay | Admitting: Cardiology

## 2008-07-13 IMAGING — CR DG CHEST 1V PORT
1 series · 1 of 1 positions shown · non-contrast
Comparison: 01/25/07 and earlier.
 Portable seated AP view at 2255 hours.

CLINICAL DATA: 51 year old female with hypertension, dehydration, and flu symptoms.
 PORTABLE CHEST - 1 VIEW - 06/30/07:

[view not recorded]
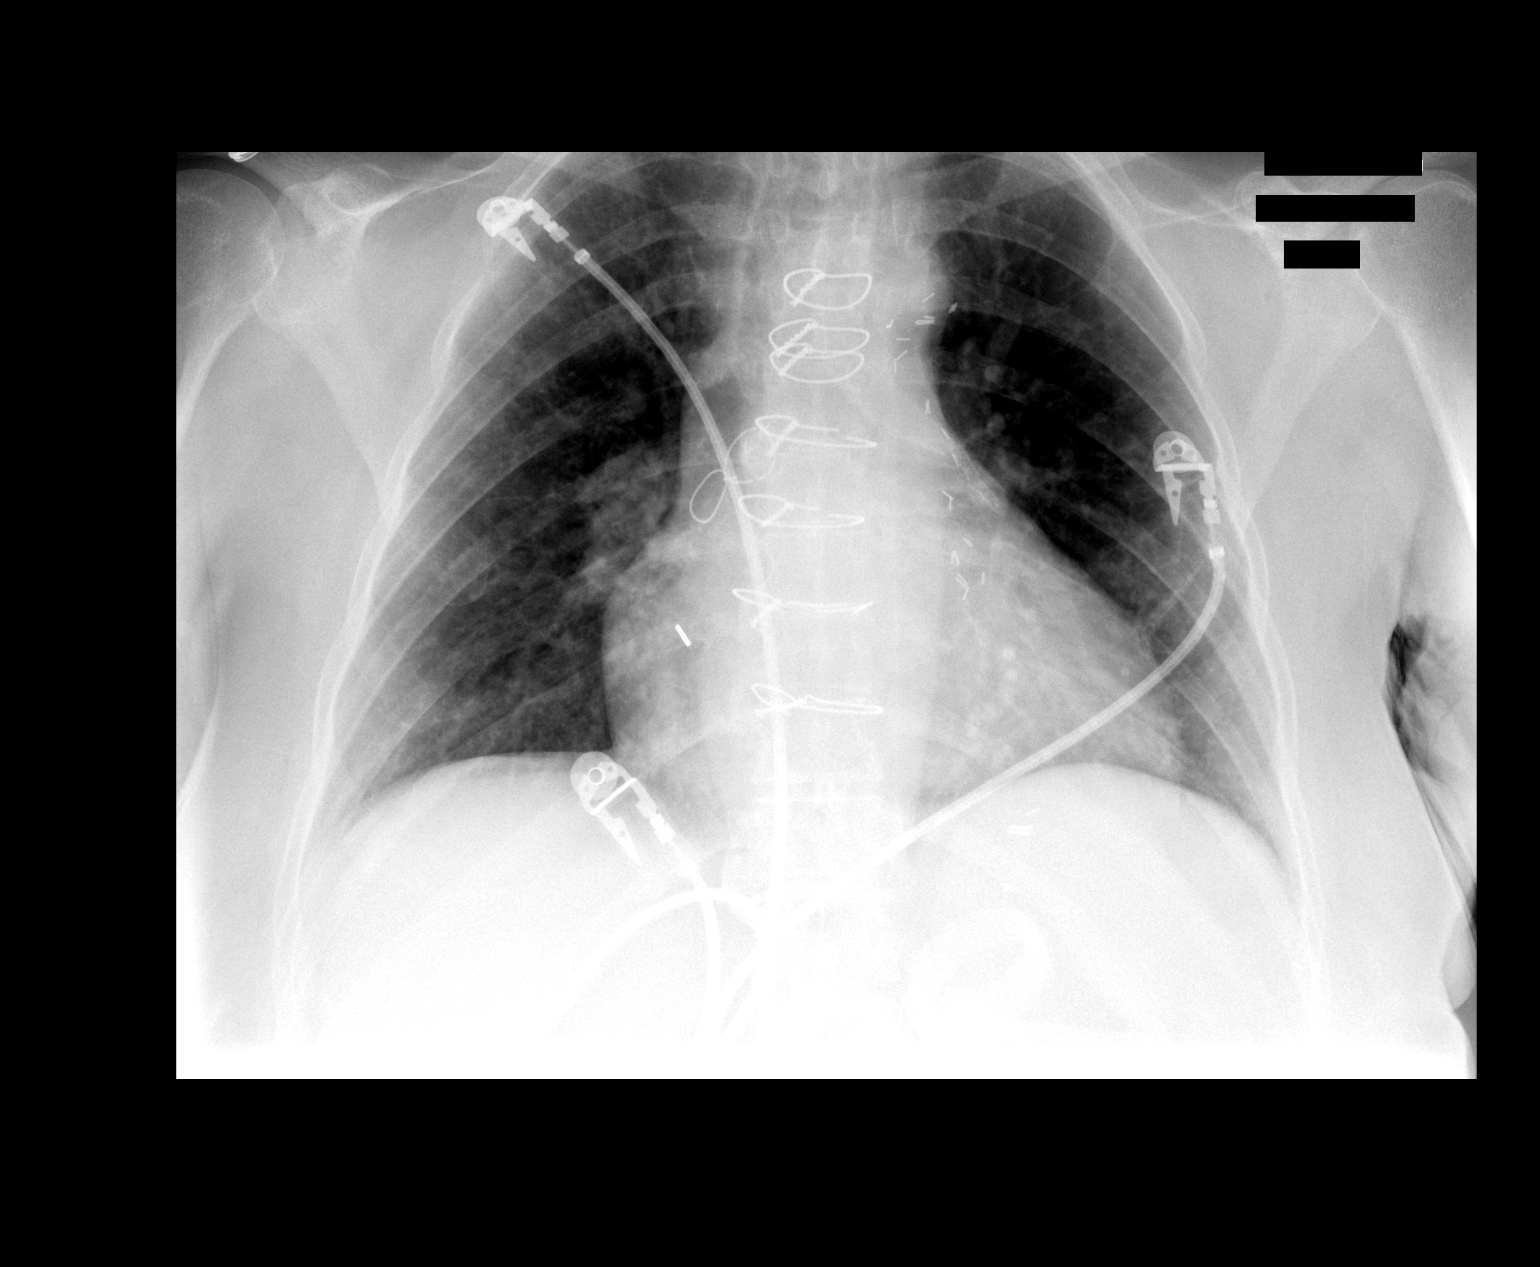

[1 of 1 positions shown; findings below may reference images not displayed]

FINDINGS: Stable mediastinal postoperative changes with several fractured sternotomy wires, unchanged.  Stable cardiac size and mediastinal contour.  No pneumothorax.  No pulmonary edema or effusion.  No focal air space opacity identified.   Multiple EKG leads overlie the chest.
IMPRESSION: No acute cardiopulmonary abnormality.

## 2008-07-17 ENCOUNTER — Ambulatory Visit: Payer: Self-pay | Admitting: Internal Medicine

## 2008-08-02 IMAGING — CR DG CHEST 2V
2 series · 2 of 2 positions shown · non-contrast
Comparison: 06/30/07.

CLINICAL DATA: Cough, chills, history of CABG in 8004.  
 CHEST X-RAY:

[w chest pa]
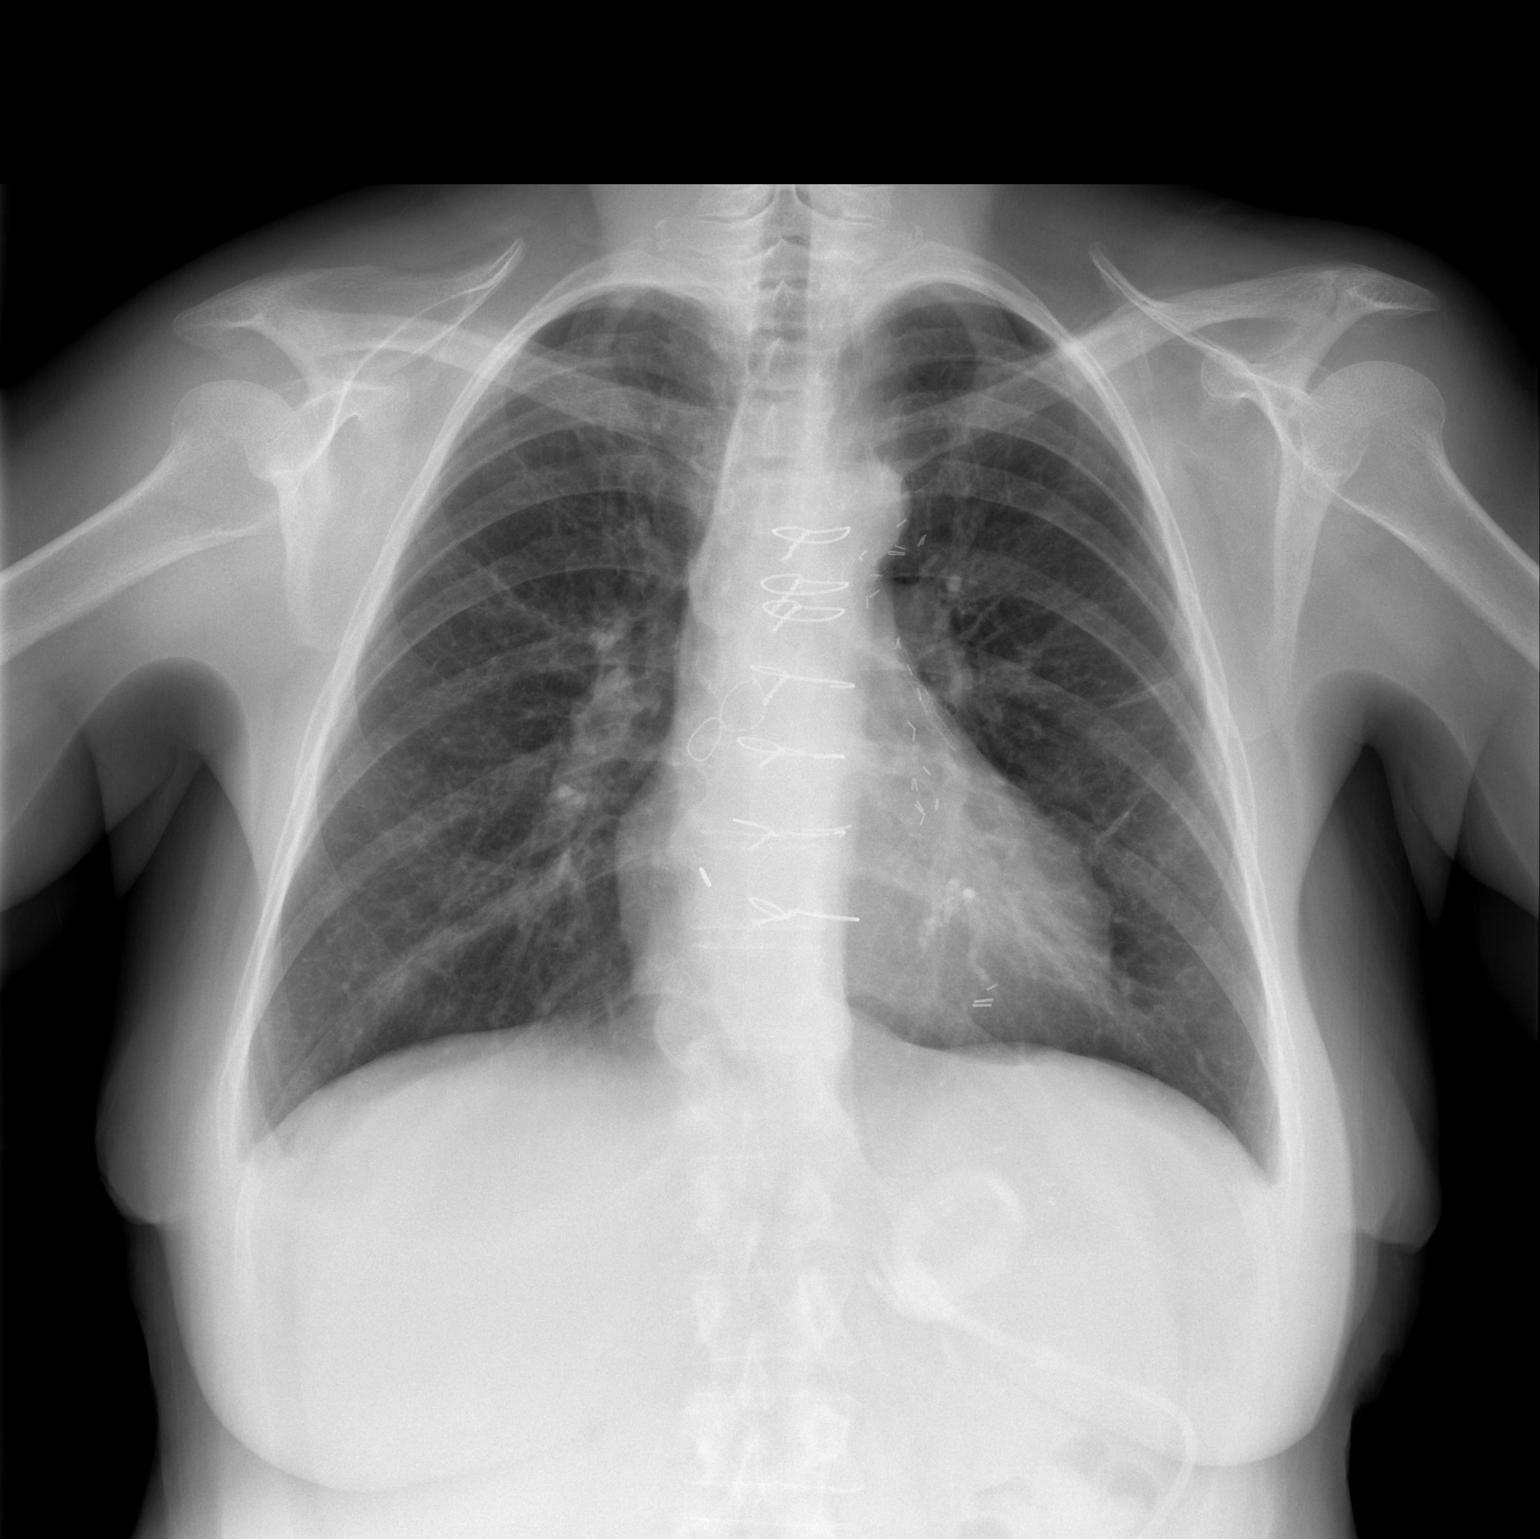

[w chest lat]
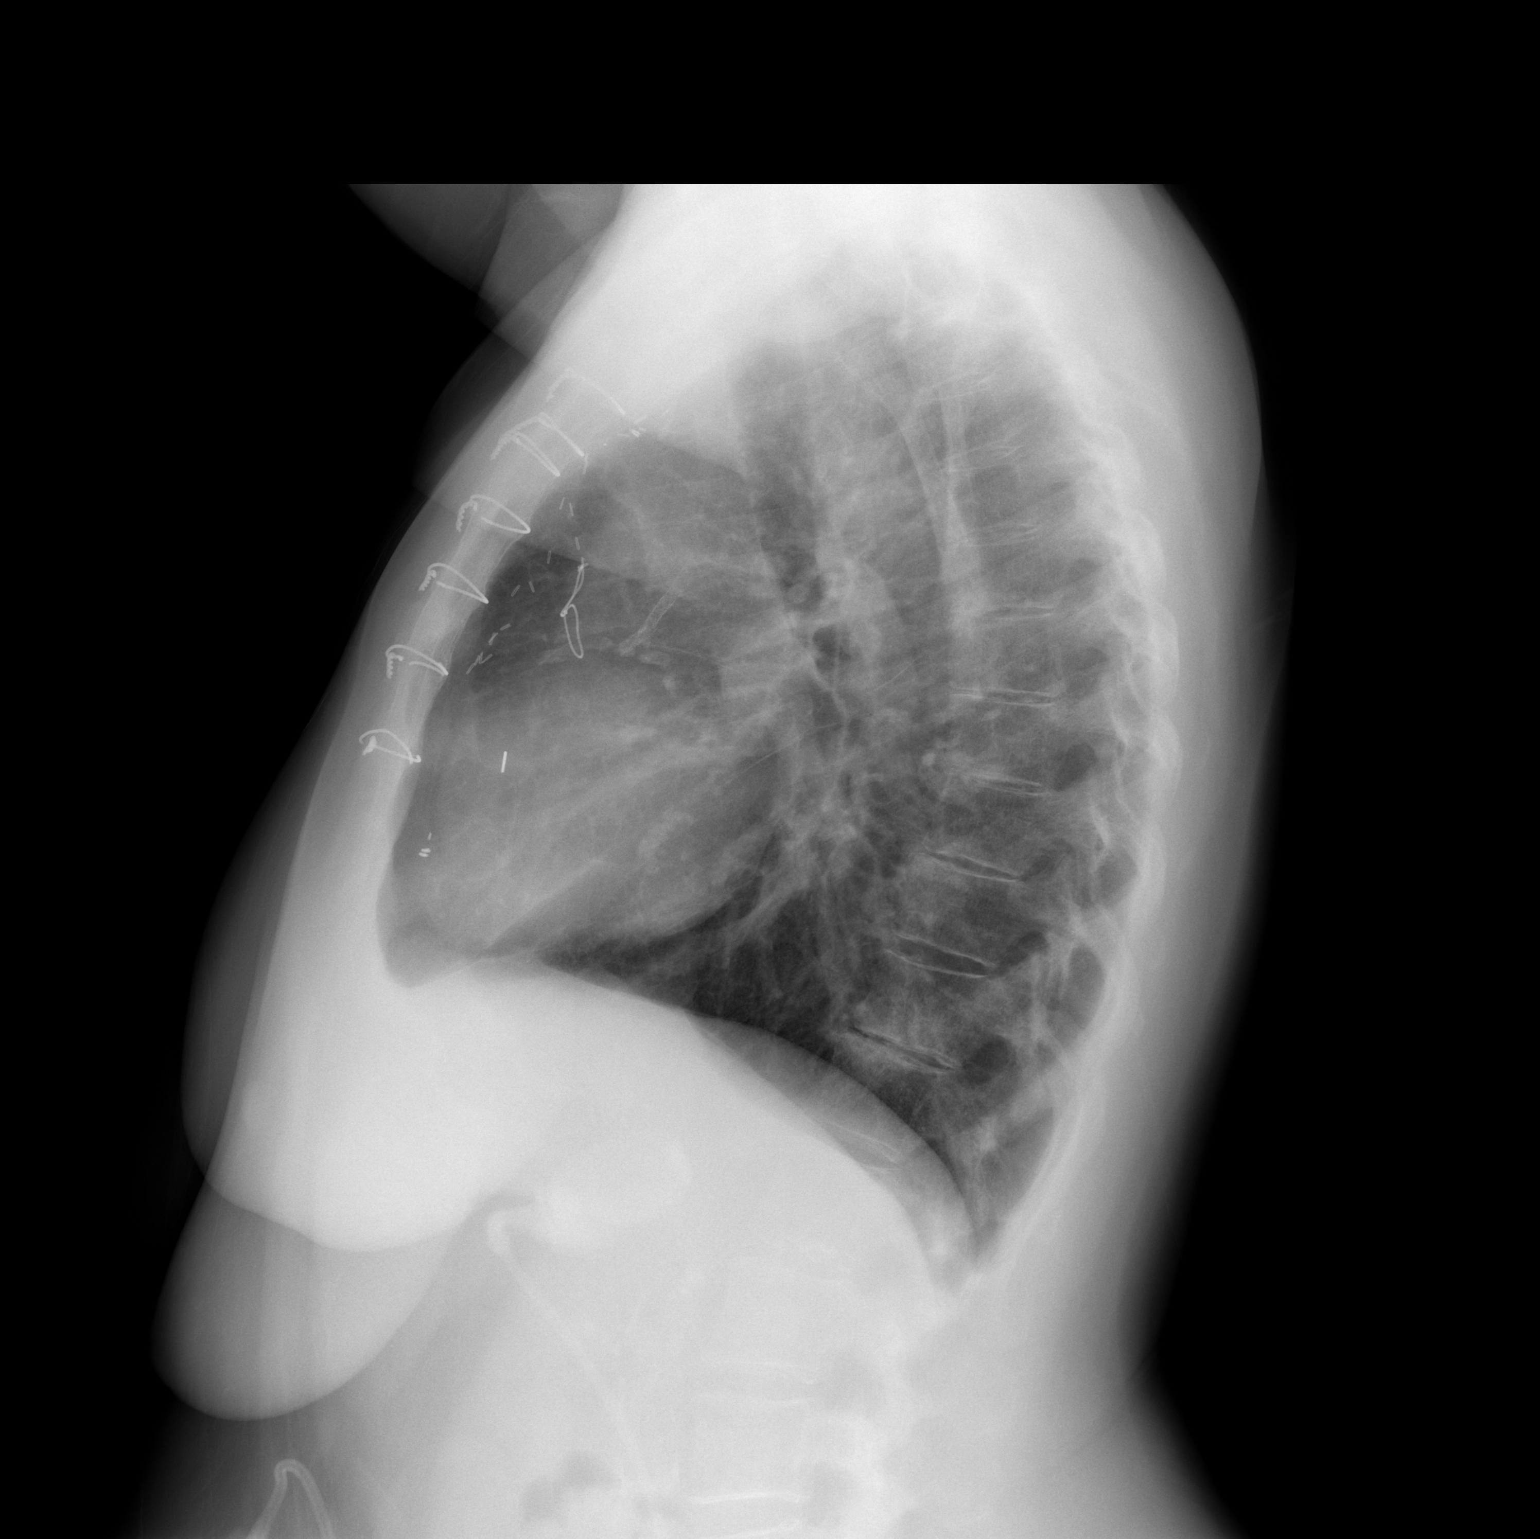

[2 of 2 positions shown; findings below may reference images not displayed]

FINDINGS: Two views of the chest show the lungs to be clear.  Mild cardiomegaly is present.  Median sternotomy sutures are noted from prior CABG.  Lap banding is present within the left upper quadrant.
IMPRESSION: Stable chest x-ray.  No active lung disease.

## 2008-08-16 ENCOUNTER — Encounter: Payer: Self-pay | Admitting: Emergency Medicine

## 2008-08-28 ENCOUNTER — Ambulatory Visit: Payer: Self-pay | Admitting: Emergency Medicine

## 2008-09-03 ENCOUNTER — Telehealth (INDEPENDENT_AMBULATORY_CARE_PROVIDER_SITE_OTHER): Payer: Self-pay | Admitting: *Deleted

## 2008-09-06 ENCOUNTER — Encounter: Payer: Self-pay | Admitting: Emergency Medicine

## 2008-09-17 ENCOUNTER — Ambulatory Visit (HOSPITAL_COMMUNITY): Admission: RE | Admit: 2008-09-17 | Discharge: 2008-09-17 | Payer: Self-pay | Admitting: General Surgery

## 2008-10-03 ENCOUNTER — Encounter: Payer: Self-pay | Admitting: Emergency Medicine

## 2008-11-02 ENCOUNTER — Encounter: Payer: Self-pay | Admitting: Emergency Medicine

## 2009-01-04 ENCOUNTER — Emergency Department (HOSPITAL_COMMUNITY): Admission: EM | Admit: 2009-01-04 | Discharge: 2009-01-04 | Payer: Self-pay | Admitting: Emergency Medicine

## 2009-01-04 ENCOUNTER — Encounter: Payer: Self-pay | Admitting: Emergency Medicine

## 2009-03-20 ENCOUNTER — Emergency Department (HOSPITAL_COMMUNITY): Admission: EM | Admit: 2009-03-20 | Discharge: 2009-03-20 | Payer: Self-pay | Admitting: Emergency Medicine

## 2009-05-15 ENCOUNTER — Ambulatory Visit: Payer: Self-pay | Admitting: Emergency Medicine

## 2009-05-24 ENCOUNTER — Telehealth (INDEPENDENT_AMBULATORY_CARE_PROVIDER_SITE_OTHER): Payer: Self-pay | Admitting: *Deleted

## 2009-06-18 IMAGING — CR DG ABDOMEN ACUTE W/ 1V CHEST
3 series · 3 of 3 positions shown · non-contrast
Comparison: Abdominal films 12/12/2007 and chest radiograph
07/20/2007.

CLINICAL DATA: Right upper quadrant abdominal pain.  History of
laparoscopic gastric banding.

ACUTE ABDOMEN SERIES (ABDOMEN 2 VIEW & CHEST 1 VIEW)

[w chest pa]
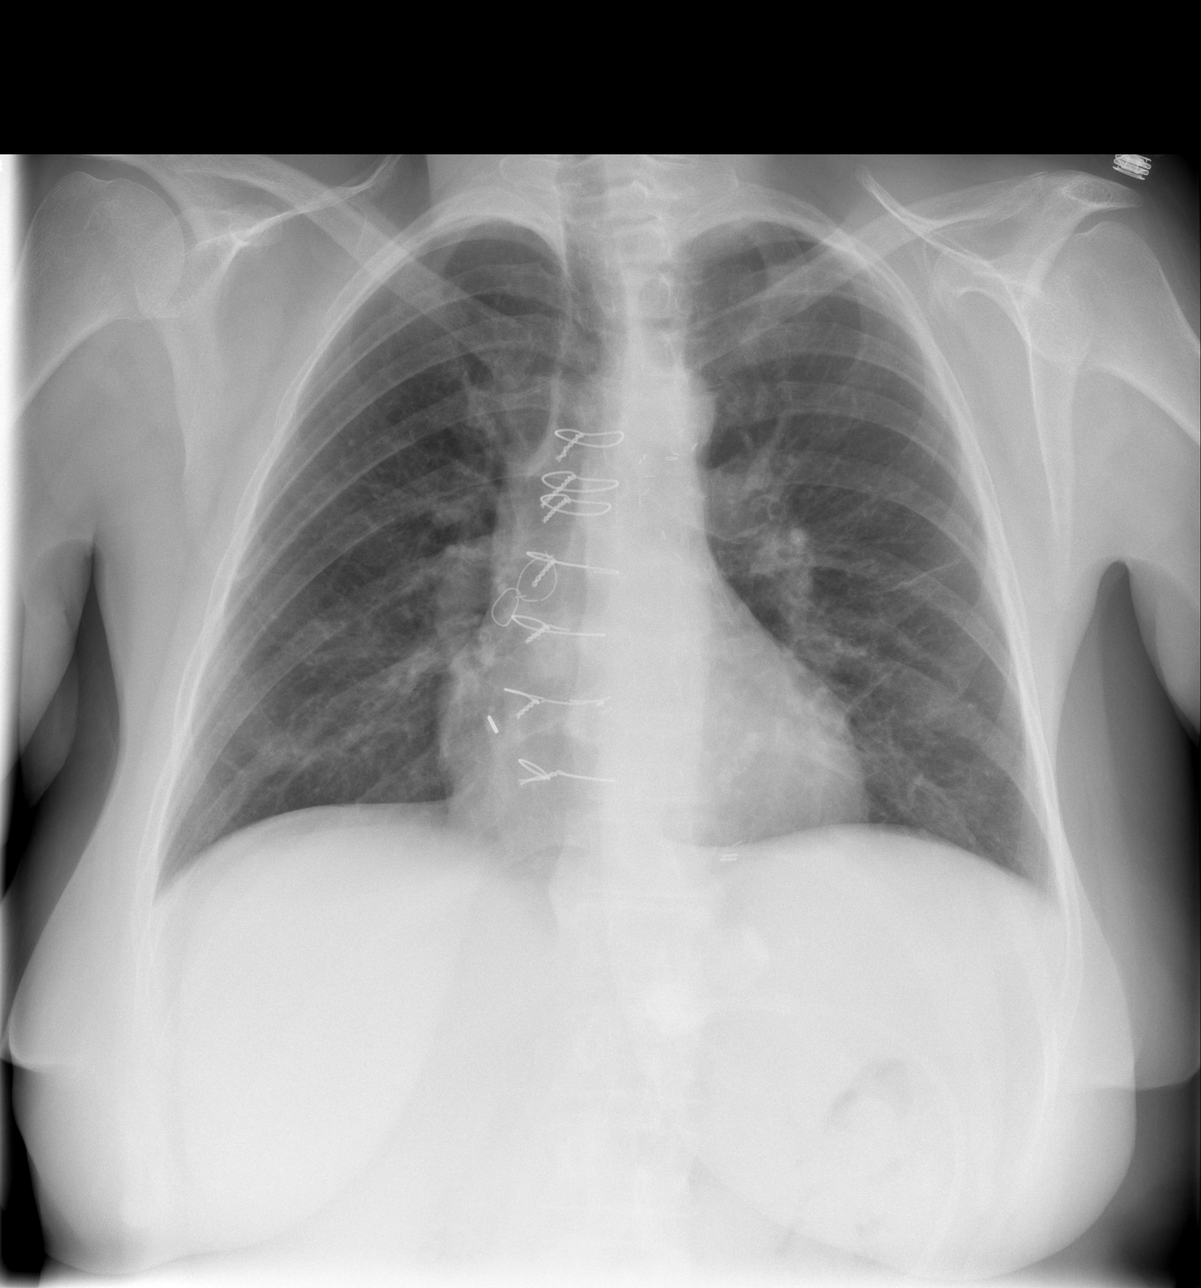

[w abdomen upright]
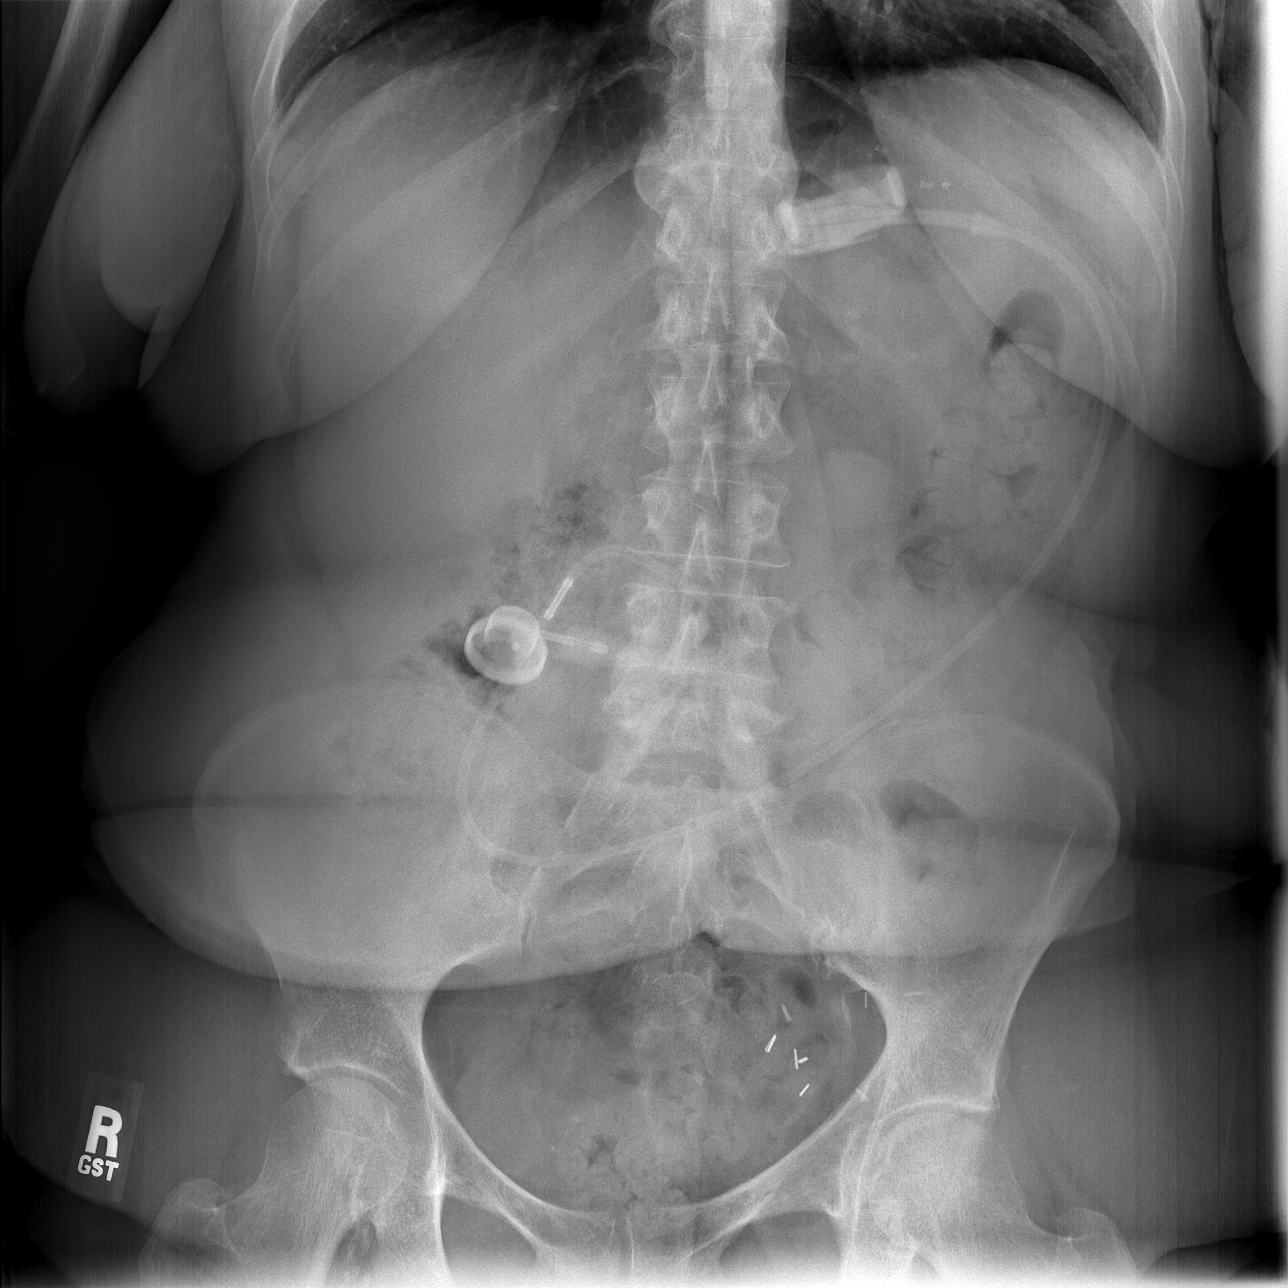

[t abdomen supine]
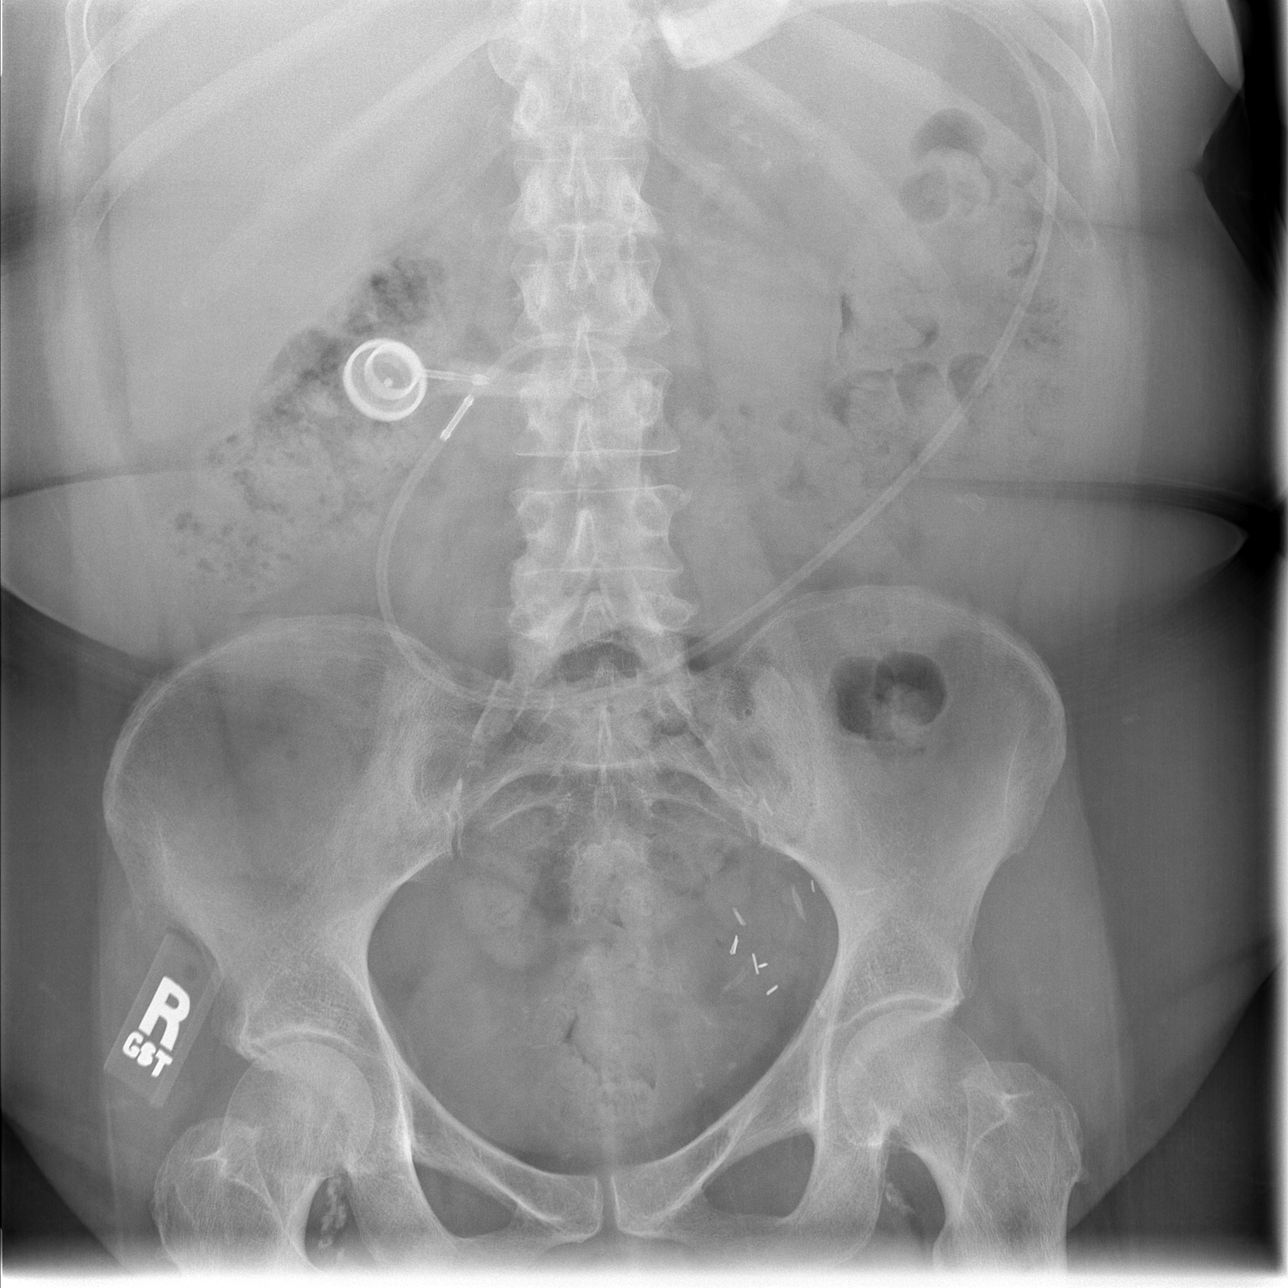

[3 of 3 positions shown; findings below may reference images not displayed]

FINDINGS: The patient is slightly rotated to the right.  Mild
interstitial prominence is stable.  No focal airspace disease is
seen.

Supine and upright views of the abdomen demonstrate a more
horizontal position of the laparoscopic and than previously seen.
On today's study the angle with the spinous process sees is 65
degrees compared with 52 degrees on the prior exam.  The bowel gas
pattern is unremarkable.  There is no evidence for obstruction or
free air.  Surgical clips are seen over the left anatomic pelvis.
Mild atherosclerotic changes are noted.
IMPRESSION: 1.  Stable chronic interstitial lung changes.
2.  More horizontal position of laparoscopic and then previously
seen.
3.  No acute abnormality of the abdomen.
4.  Atherosclerosis.

## 2009-06-18 IMAGING — CR DG CHEST 1V PORT
1 series · 1 of 1 positions shown · non-contrast
Comparison: 07/20/2007

CLINICAL DATA: Right IJ central line placement.

PORTABLE CHEST - 1 VIEW

[AP]
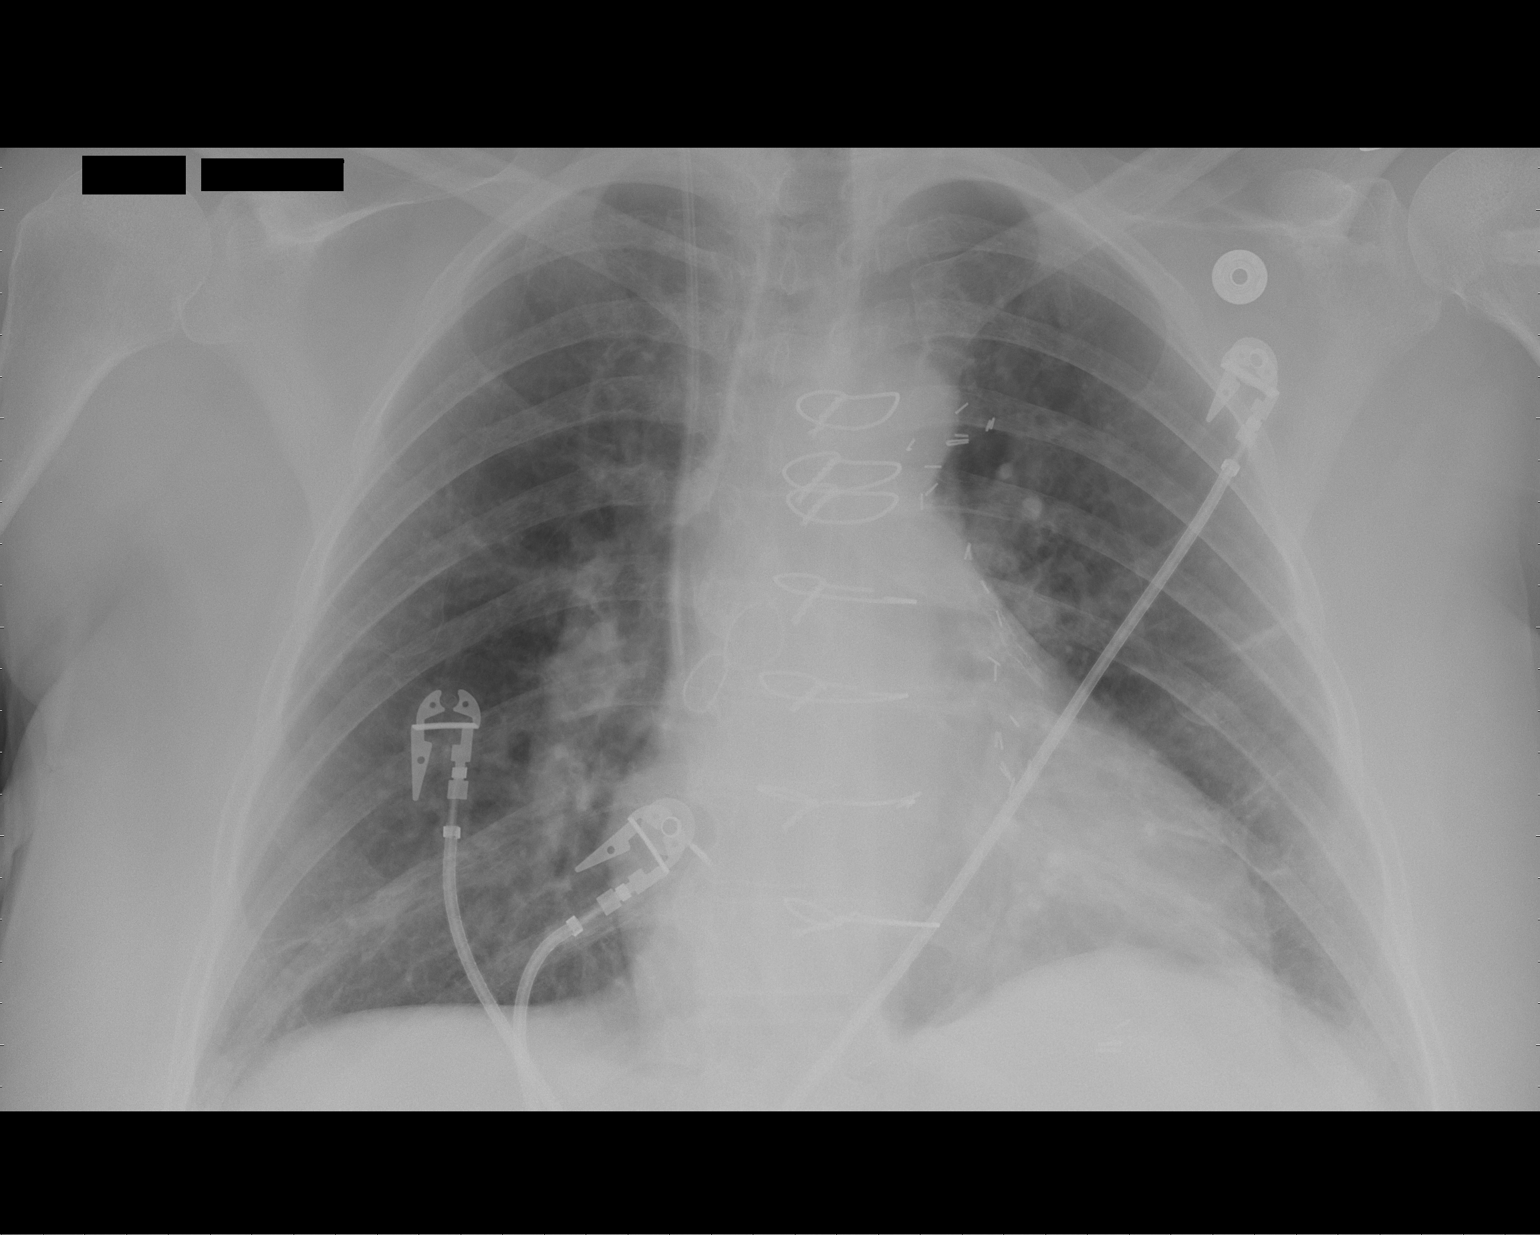

[1 of 1 positions shown; findings below may reference images not displayed]

FINDINGS: Right IJ central line tip projects over the SVC.  No
pneumothorax.  Heart size stable.  Scar is seen in the lingula.
Mild bibasilar atelectasis.
IMPRESSION: 1.  Right IJ central line placement without pneumothorax.
2.  Mild bibasilar atelectasis.

## 2009-06-18 IMAGING — CT CT ABDOMEN W/ CM
1 of 3 series · 15 of 32 positions shown, 19 images · IV contrast (APPLIED)
Comparison: 06/29/2003

CT ABDOMEN

CLINICAL DATA: Abdominal pain after surgery for blood clot.
Nausea and vomiting.  Recent lap band placement.

CT ABDOMEN AND PELVIS WITH CONTRAST
TECHNIQUE: Multidetector CT imaging of the abdomen and pelvis was
performed using the standard protocol following bolus
administration of intravenous contrast.
Contrast: 80 ml 7mnipaque-TPP

[Series 2: abd/pelv with 5.0 b31f st · axial · 0.70mm/px · z∈[-489,-79]mm · 15 of 92 slices shown, 19 images]
[im 5/92  soft-tissue]
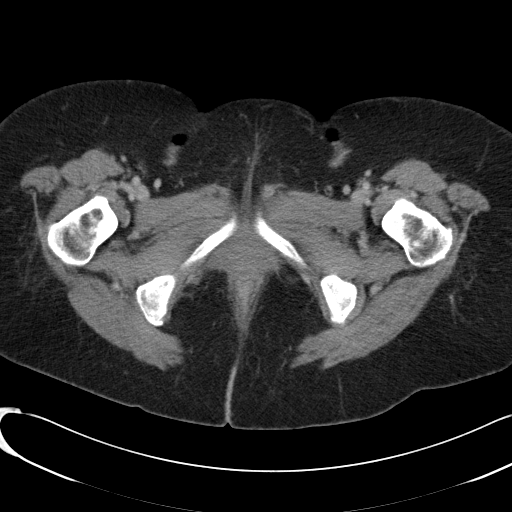
[im 5/92  bone]
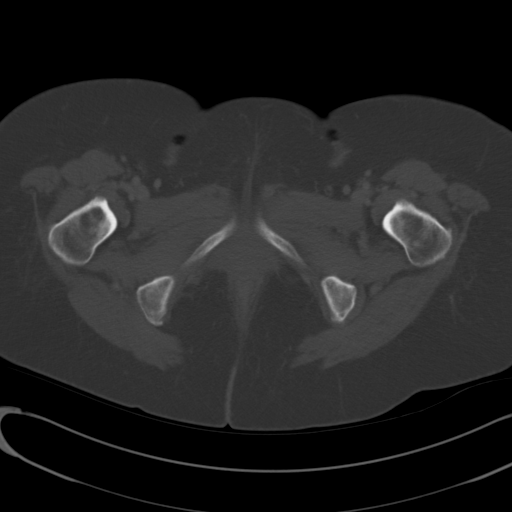
[im 13/92  soft-tissue]
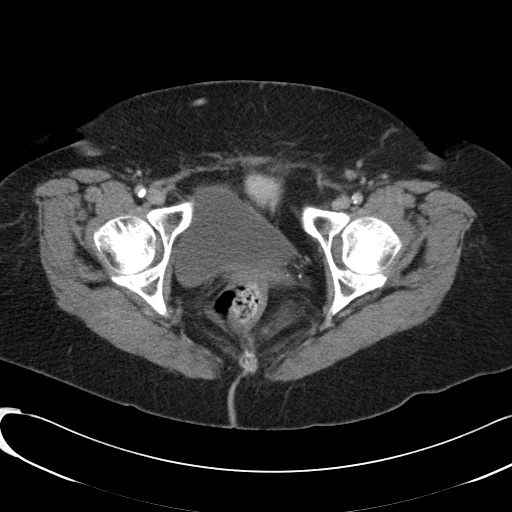
[im 21/92  soft-tissue]
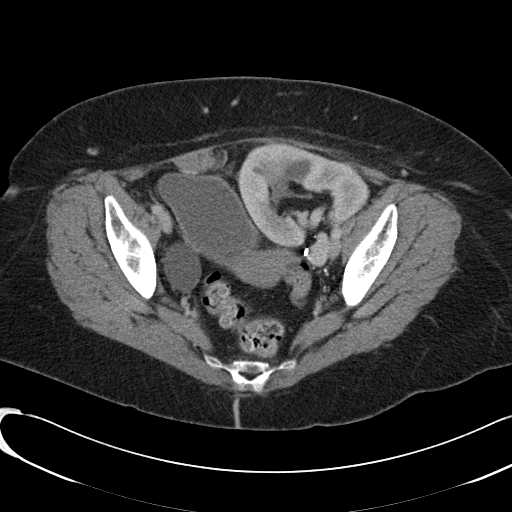
[im 25/92  soft-tissue]
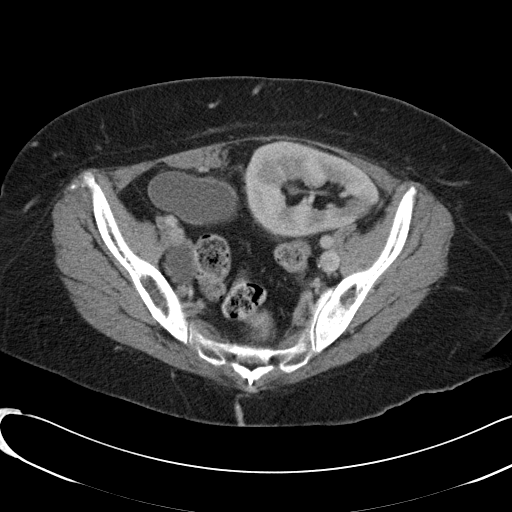
[im 34/92  soft-tissue]
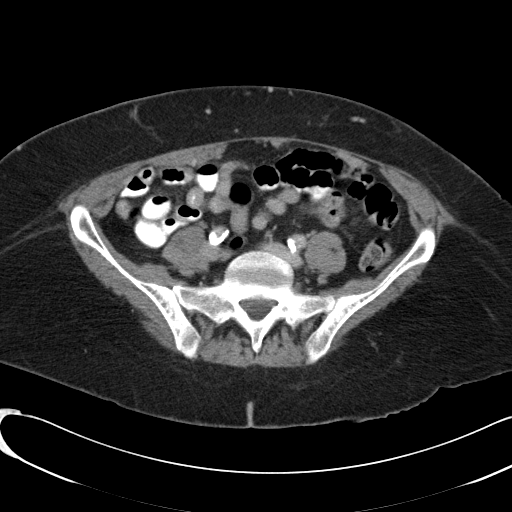
[im 38/92  soft-tissue]
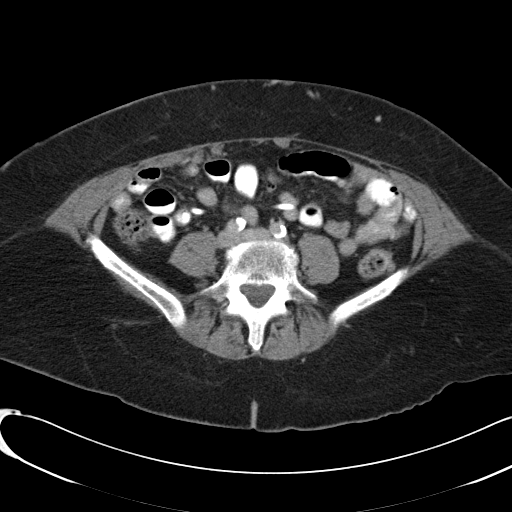
[im 46/92  soft-tissue]
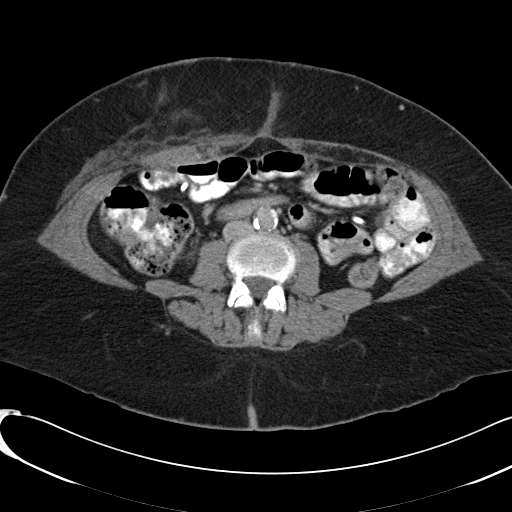
[im 54/92  soft-tissue]
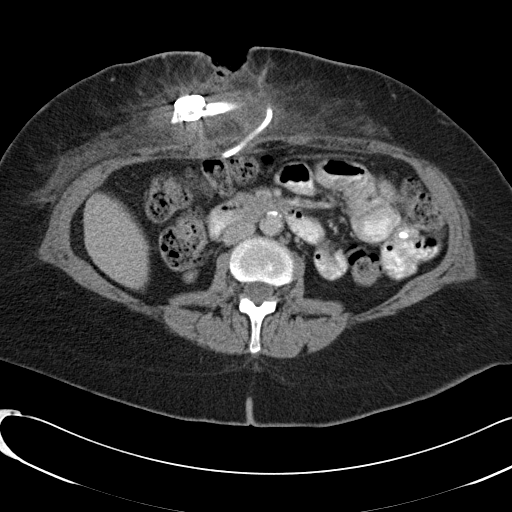
[im 58/92  soft-tissue]
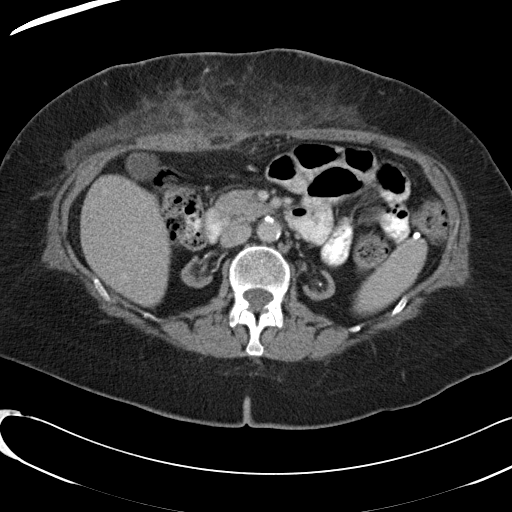
[im 58/92  bone]
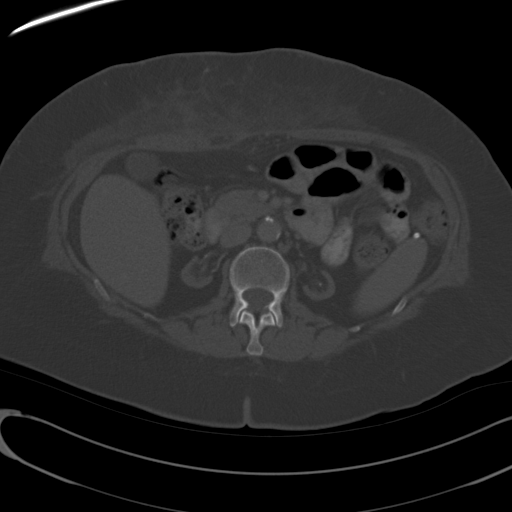
[im 67/92  soft-tissue]
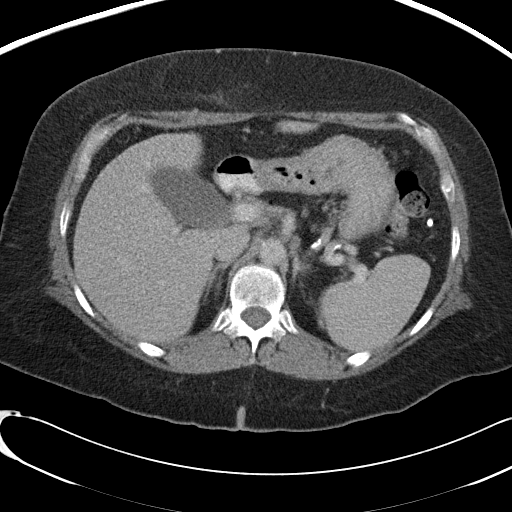
[im 71/92  soft-tissue]
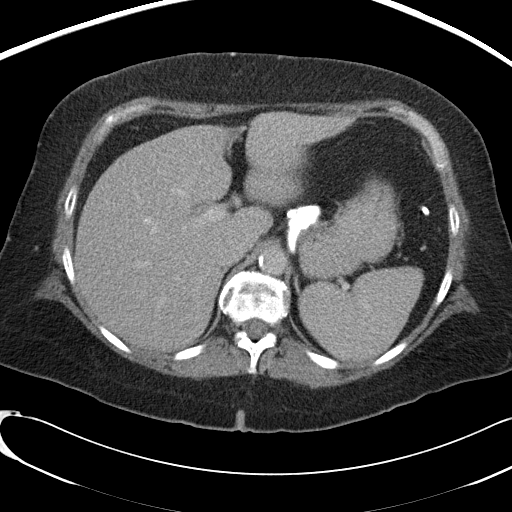
[im 75/92  lung]
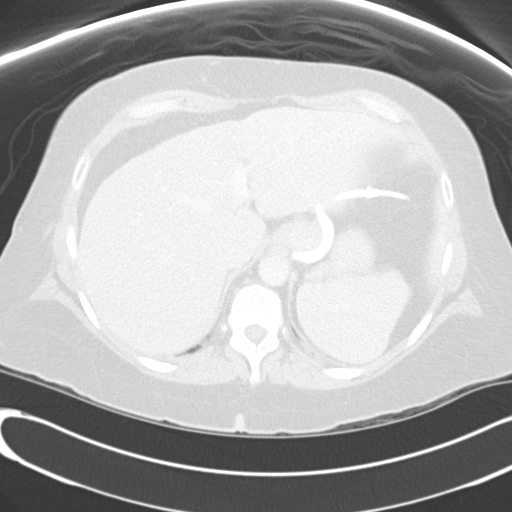
[im 79/92  soft-tissue]
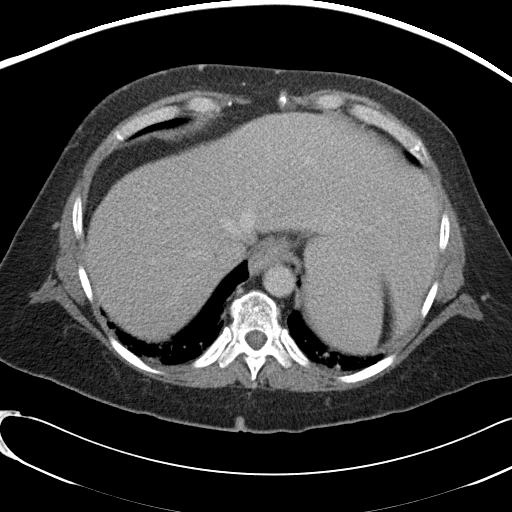
[im 79/92  lung]
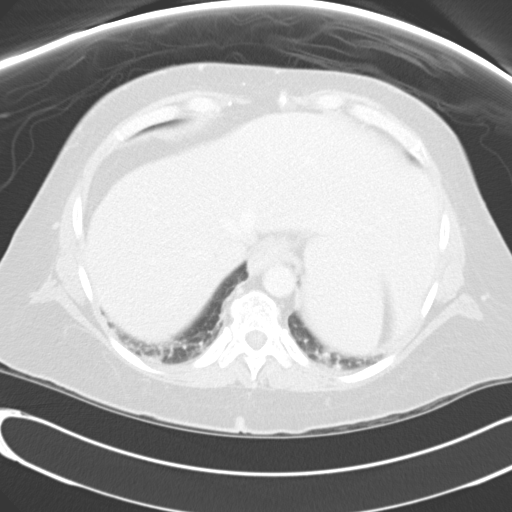
[im 83/92  lung]
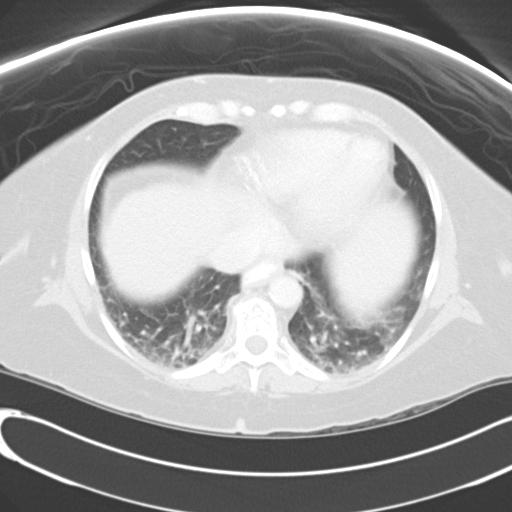
[im 87/92  soft-tissue]
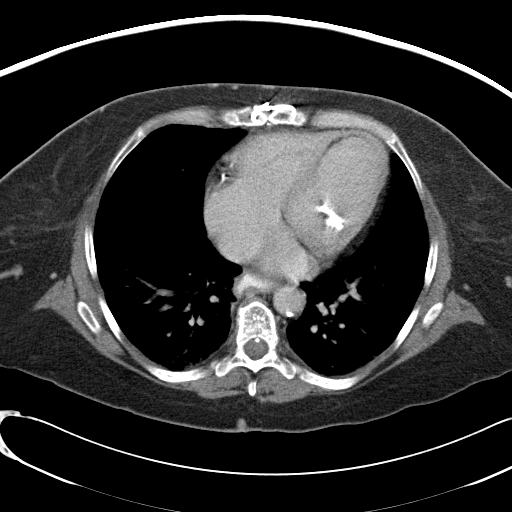
[im 87/92  lung]
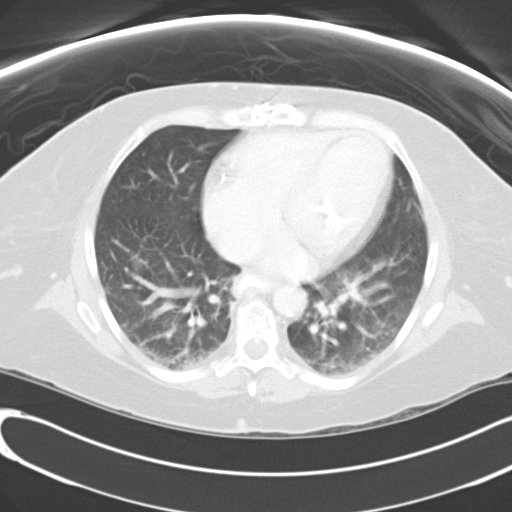

[15 of 32 positions shown; findings below may reference images not displayed]

FINDINGS: Lung bases show dependent atelectasis.  Heart is at the
upper limits of normal in size.  No pericardial or pleural
effusion.  Distal esophagus is dilated and fluid-filled.

Liver, gallbladder and adrenal glands unremarkable.  Native kidneys
are atrophic.  Spleen, pancreas unremarkable.  Percutaneous gastric
band is in place.  Stomach and small bowel are otherwise
unremarkable.  Postoperative changes are seen in the ventral
abdominal wall, with extensive subcutaneous inflammation,
subcutaneous emphysema and open wound.  There are unenlarged lymph
nodes in the abdomen.
IMPRESSION: 1.  Postoperative changes in the ventral abdominal wall with
gastric band in place.  Suspect superimposed infection about the
port in the ventral abdominal wall.  No additional acute findings.
2.  Dilated fluid-filled distal esophagus.

CT PELVIS
FINDINGS: Renal transplant is seen in the left iliac fossa.  Low
attenuation lesions in the left renal transplant measure up to
cm, and are likely cysts.  Adnexal cystic lesion measures 4.0 x
cm.  Left ovary is visualized.  Colon and appendix unremarkable.
No pathologically enlarged lymph nodes.  No free fluid.  No
worrisome lytic or sclerotic lesions.
IMPRESSION: 1.  Right adnexal cystic lesion.  Follow-up ultrasound in 6 weeks
is recommended to ensure resolution, as clinically indicated.

## 2009-07-13 IMAGING — CR DG CHEST 2V
2 series · 2 of 2 positions shown · non-contrast
Comparison: 06/04/2008

CLINICAL DATA: Stroke, coronary artery disease status post stenting
and CABG, pre procedural exam

CHEST - 2 VIEW

[view not recorded (1 of 2)]
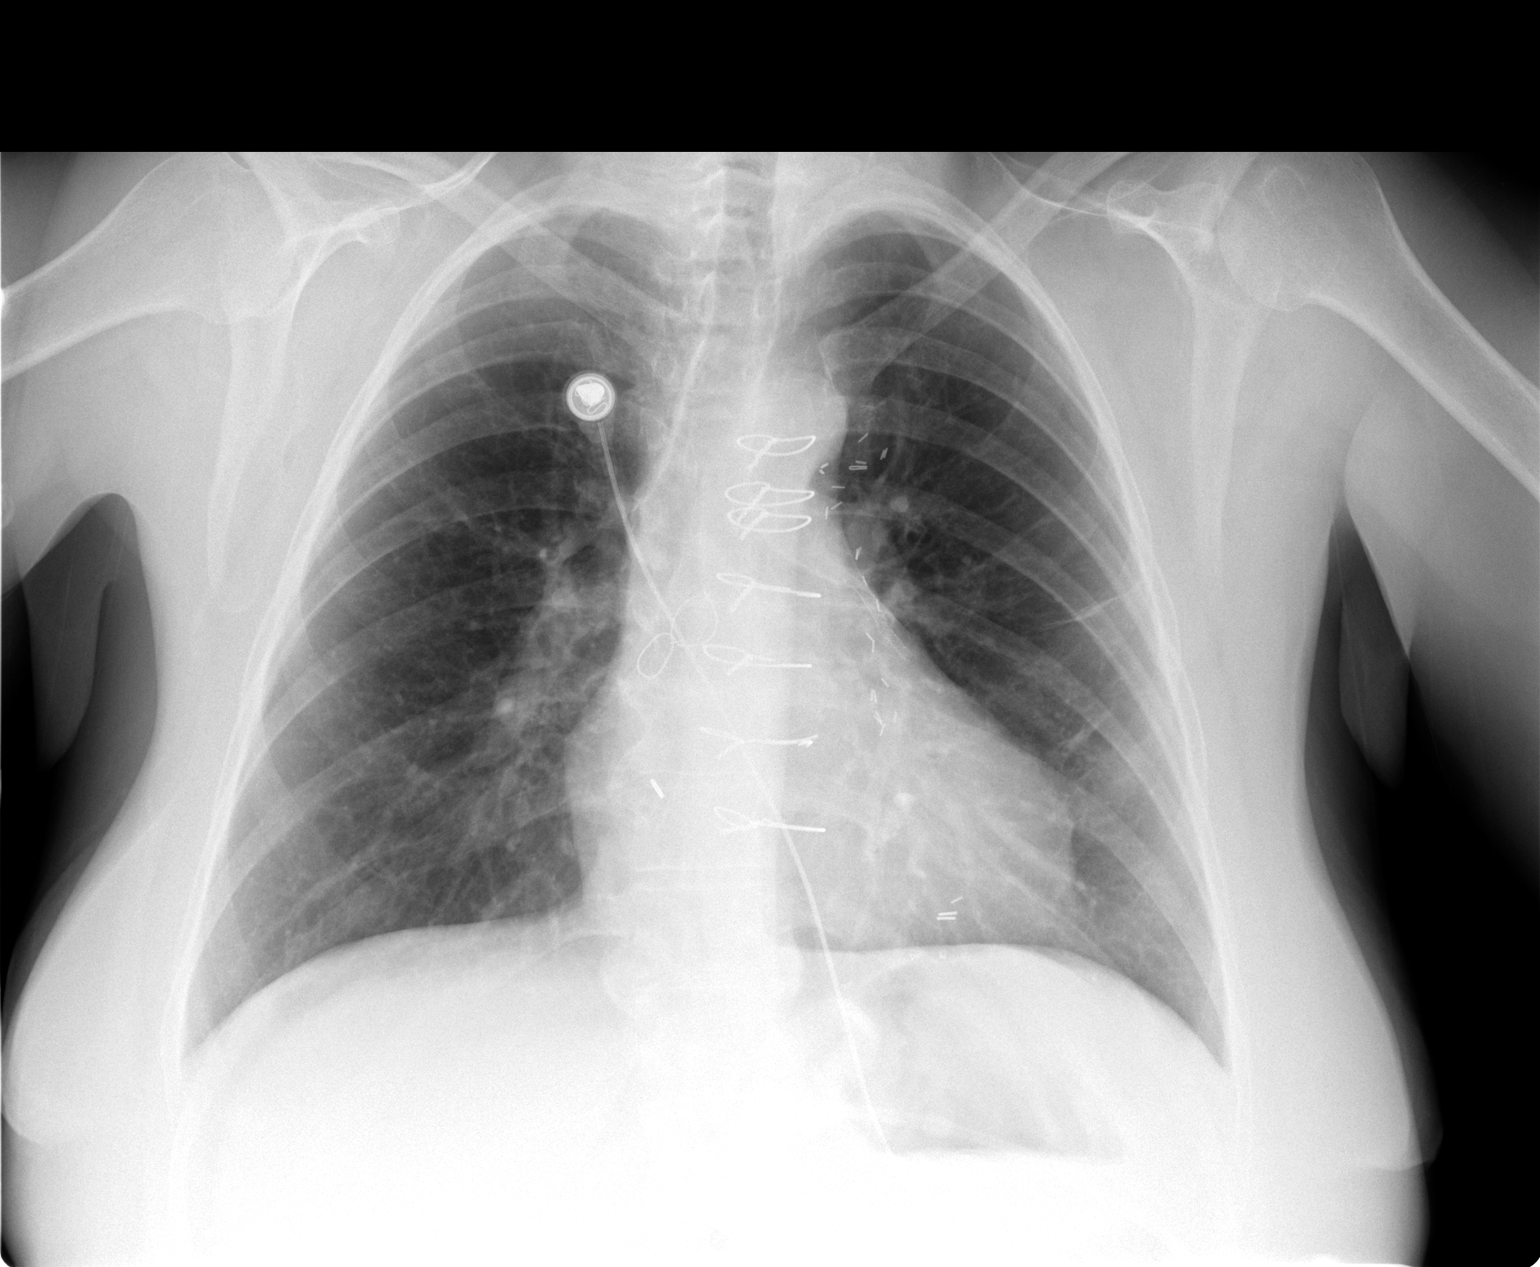

[view not recorded (2 of 2)]
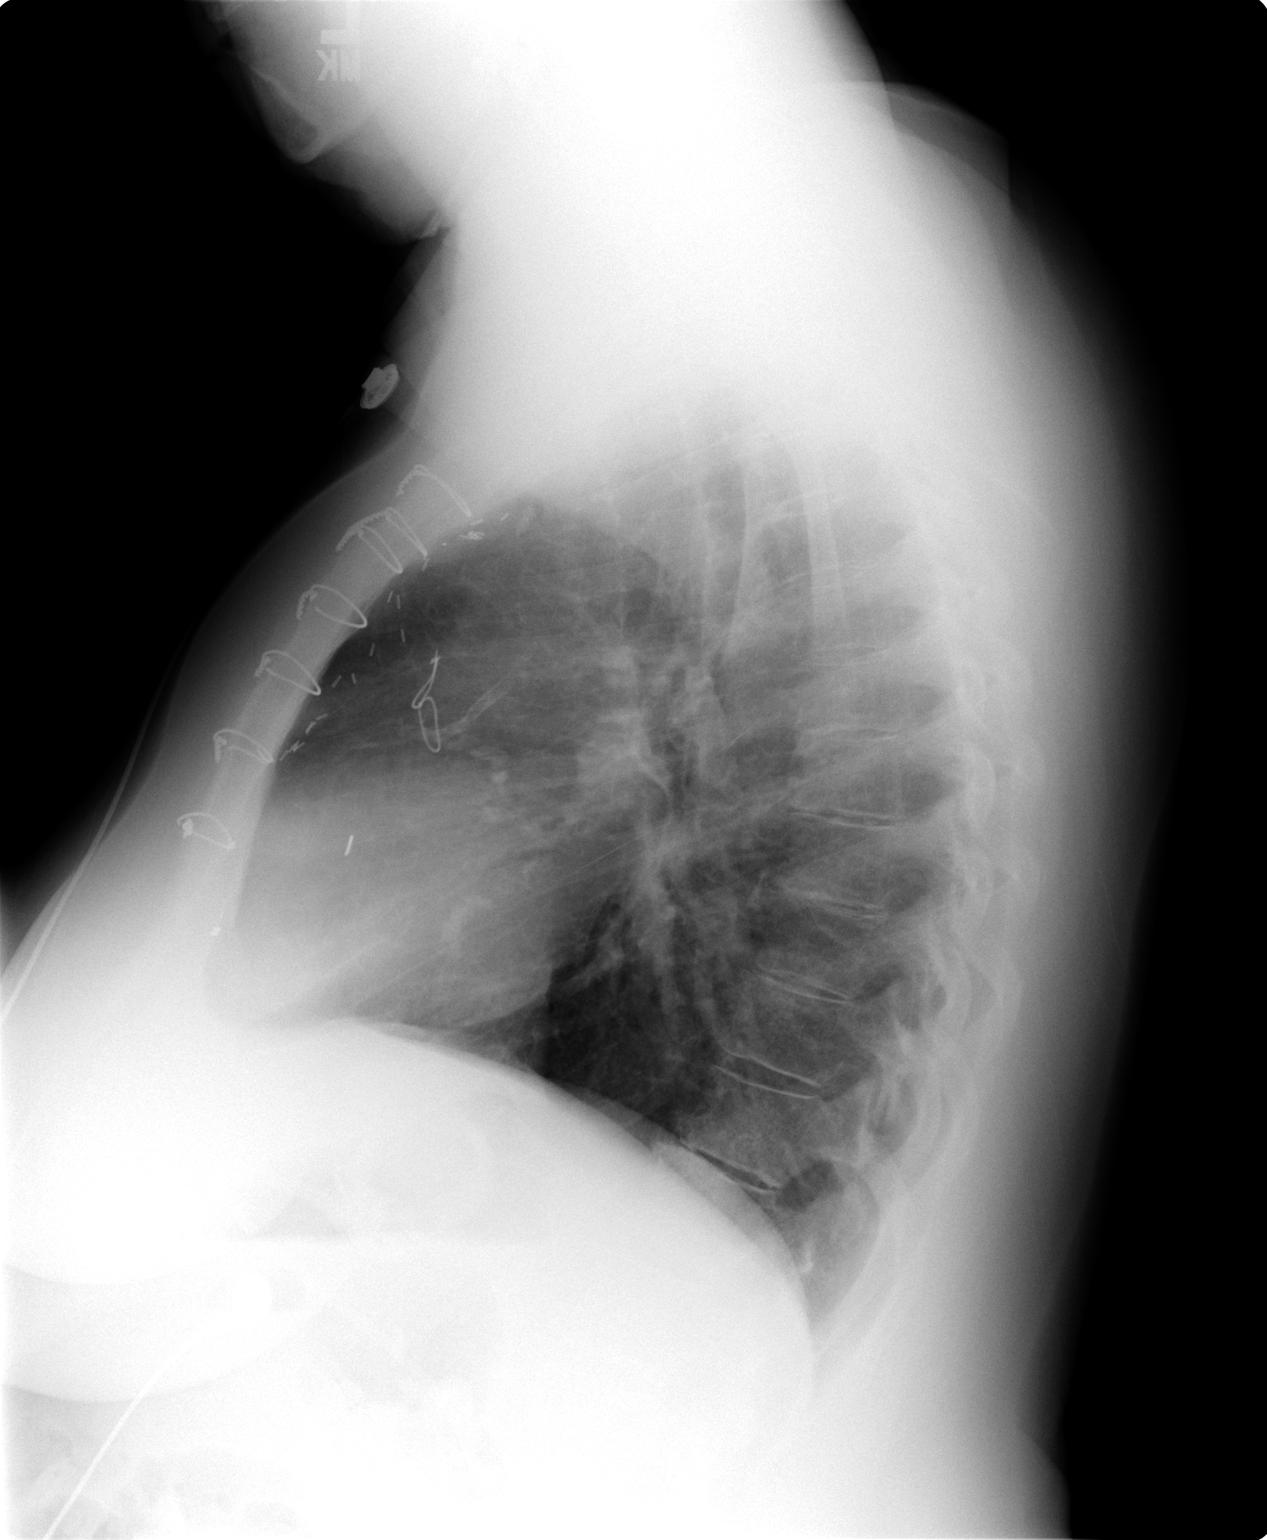

[2 of 2 positions shown; findings below may reference images not displayed]

FINDINGS: Upper normal heart size status post CABG.
Normal mediastinal contours and pulmonary vascularity.
Minimal scarring left mid lung.
Lungs otherwise clear.
Previously identified right jugular line has been removed.
No acute bony abnormalities.
Coronary arterial stents are seen at expected distribution of left
coronary circulation.
IMPRESSION: Status post CABG and coronary arterial stenting.
Stable left mid lung scarring without acute pulmonary abnormality.

## 2009-12-02 ENCOUNTER — Telehealth (INDEPENDENT_AMBULATORY_CARE_PROVIDER_SITE_OTHER): Payer: Self-pay | Admitting: *Deleted

## 2009-12-18 ENCOUNTER — Ambulatory Visit: Payer: Self-pay | Admitting: Emergency Medicine

## 2010-01-23 ENCOUNTER — Ambulatory Visit: Payer: Self-pay | Admitting: Emergency Medicine

## 2010-05-29 IMAGING — CR DG CHEST 2V
2 series · 2 of 2 positions shown · non-contrast
Comparison: Chest 06/29/2008.

CLINICAL DATA: Cough, shortness of breath and chest pain.

CHEST - 2 VIEW

[view not recorded (1 of 2)]
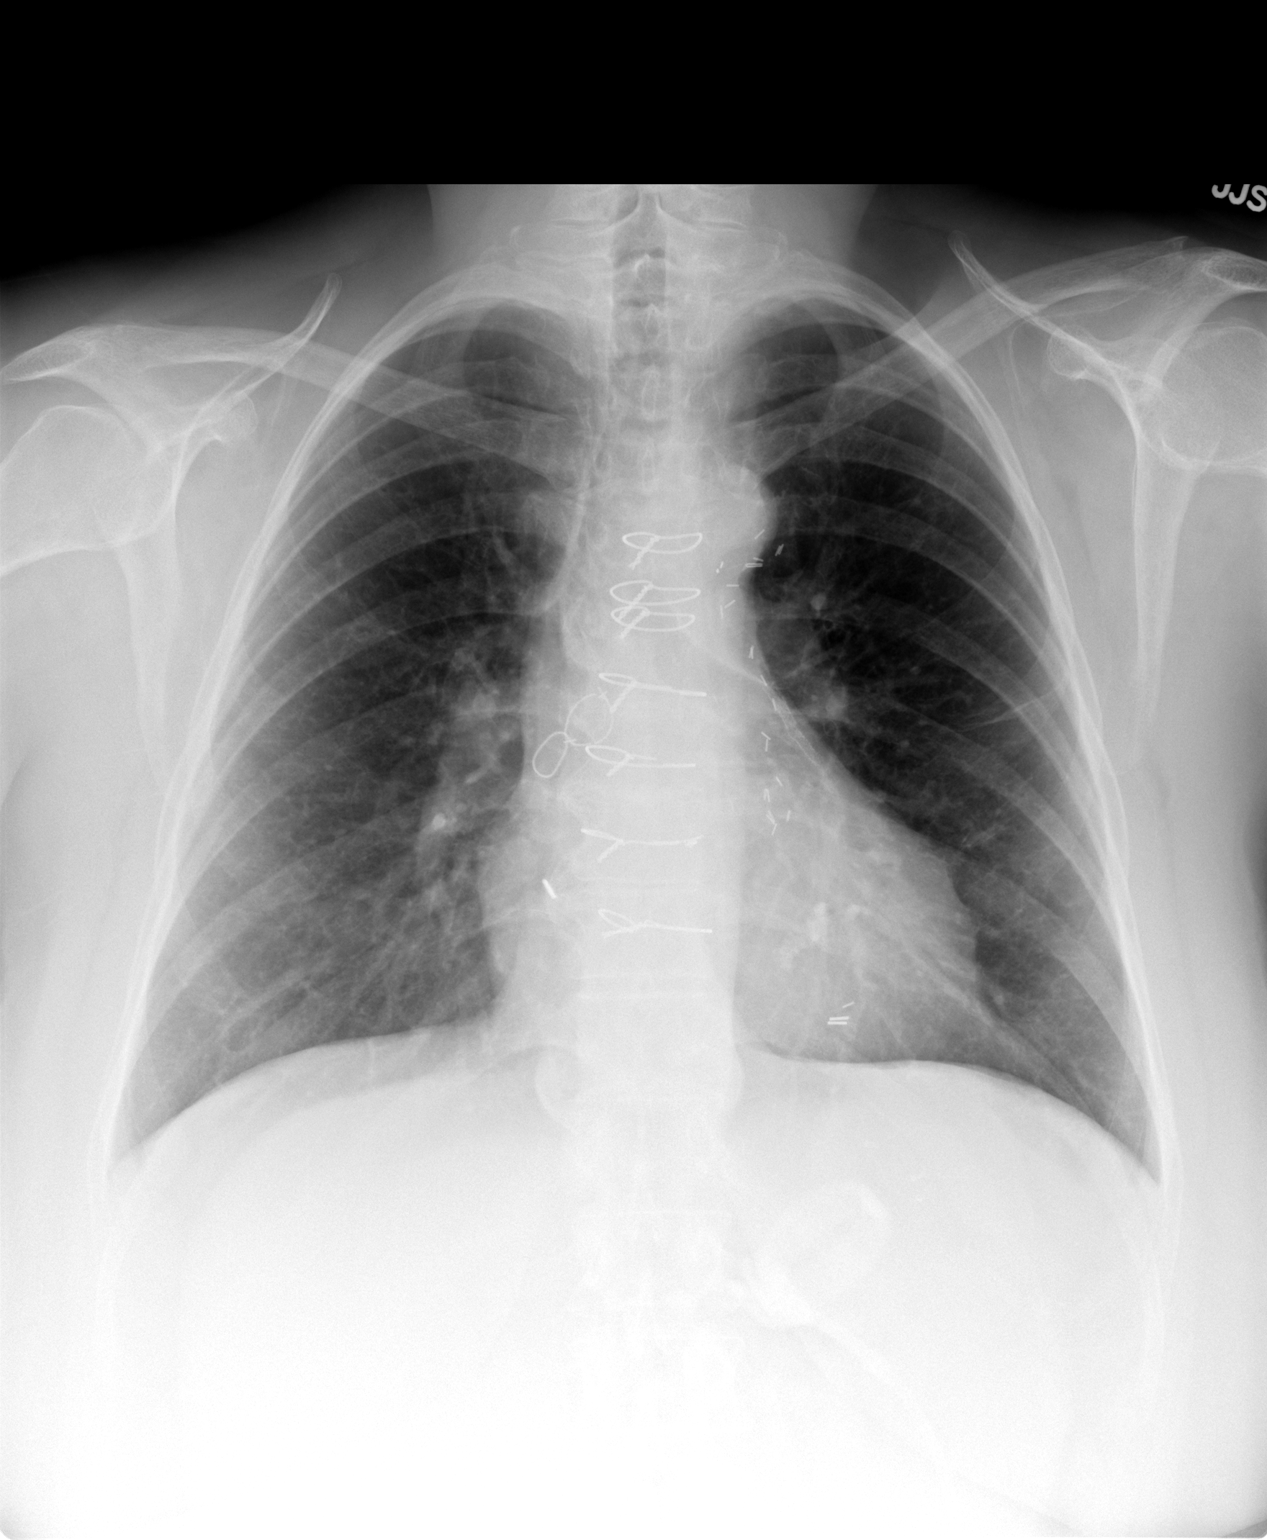

[view not recorded (2 of 2)]
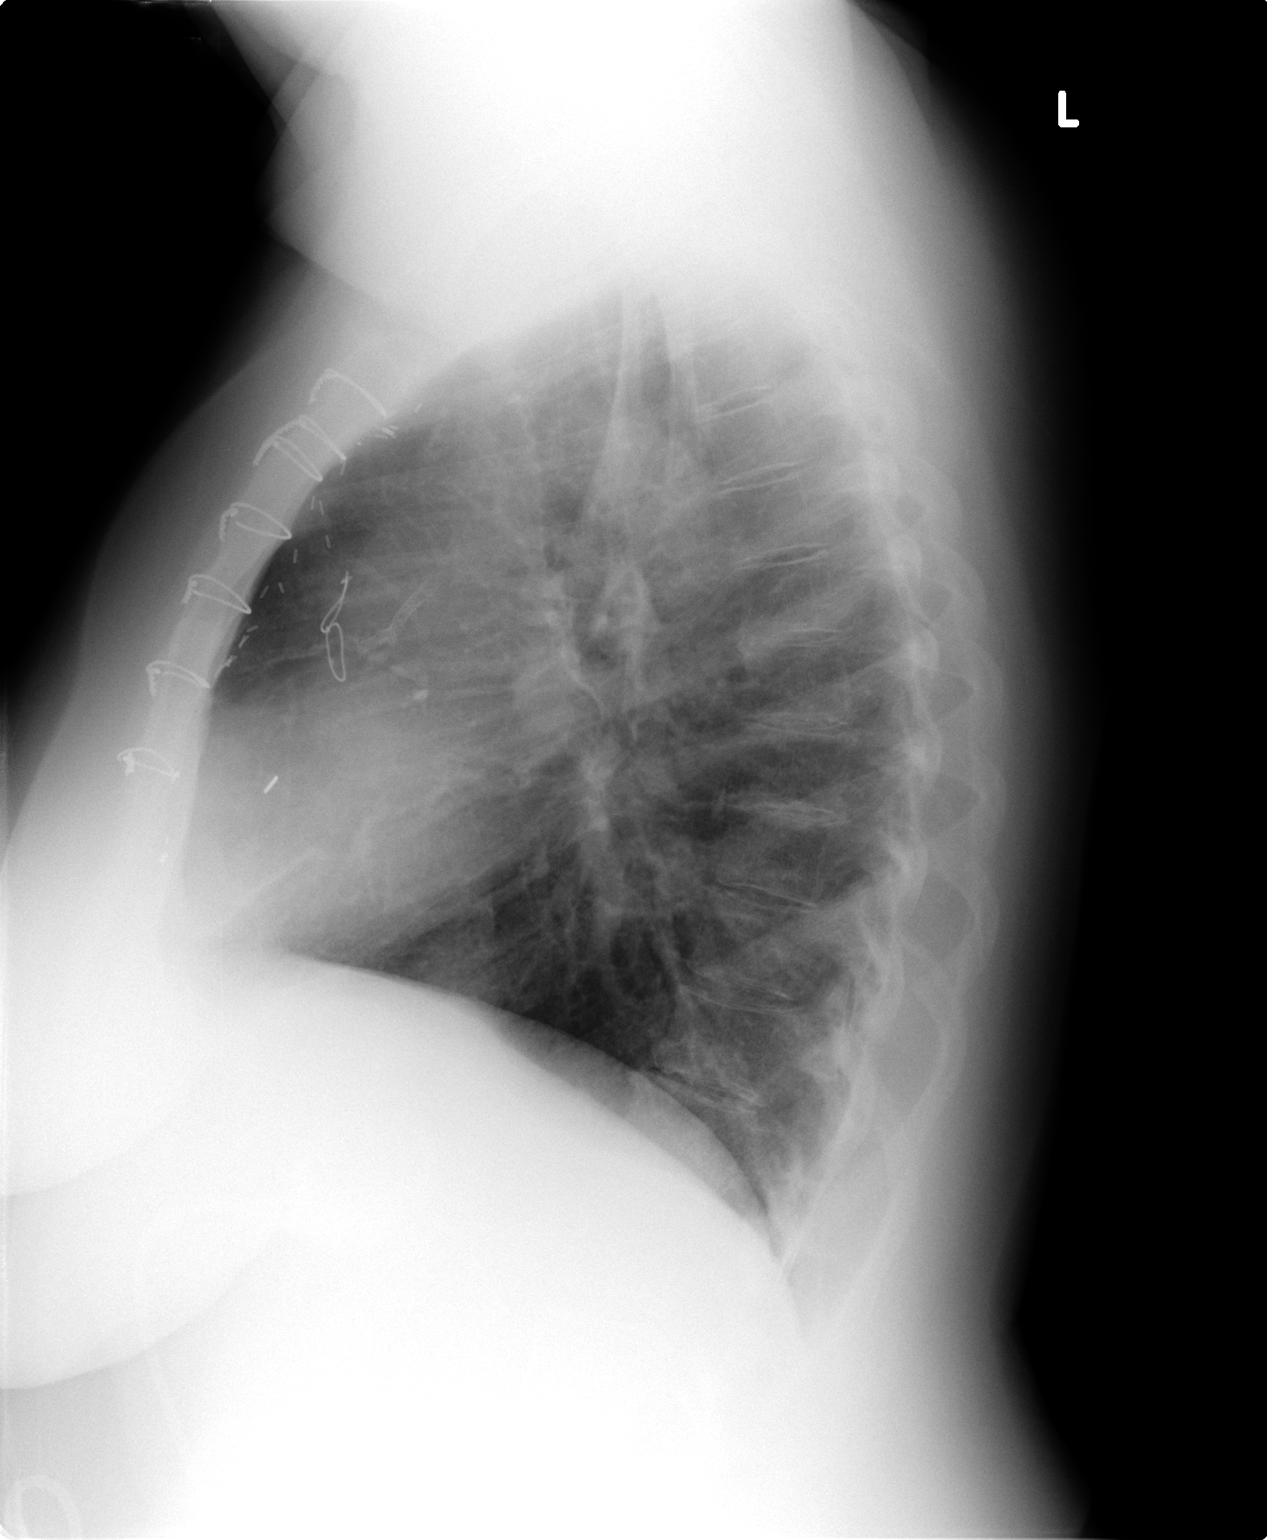

[2 of 2 positions shown; findings below may reference images not displayed]

FINDINGS: The patient is status post CABG.  Heart size is normal.
As on the prior study, there is a small focus of scarring in the
left mid lung.  Lungs otherwise clear.  No pulmonary edema or
pleural effusion.  Coronary artery stent noted.
IMPRESSION: No acute finding.  Stable compared to prior exam.

## 2010-07-22 ENCOUNTER — Telehealth (INDEPENDENT_AMBULATORY_CARE_PROVIDER_SITE_OTHER): Payer: Self-pay | Admitting: *Deleted

## 2010-07-30 ENCOUNTER — Ambulatory Visit: Payer: Self-pay | Admitting: Emergency Medicine

## 2010-08-06 ENCOUNTER — Telehealth (INDEPENDENT_AMBULATORY_CARE_PROVIDER_SITE_OTHER): Payer: Self-pay | Admitting: *Deleted

## 2010-08-07 ENCOUNTER — Telehealth (INDEPENDENT_AMBULATORY_CARE_PROVIDER_SITE_OTHER): Payer: Self-pay | Admitting: *Deleted

## 2010-08-17 DIAGNOSIS — Z8673 Personal history of transient ischemic attack (TIA), and cerebral infarction without residual deficits: Secondary | ICD-10-CM

## 2010-08-17 HISTORY — DX: Personal history of transient ischemic attack (TIA), and cerebral infarction without residual deficits: Z86.73

## 2010-08-20 ENCOUNTER — Ambulatory Visit
Admission: RE | Admit: 2010-08-20 | Discharge: 2010-08-20 | Payer: Self-pay | Source: Home / Self Care | Attending: Emergency Medicine | Admitting: Emergency Medicine

## 2010-08-28 ENCOUNTER — Encounter: Payer: Self-pay | Admitting: Emergency Medicine

## 2010-09-06 ENCOUNTER — Encounter: Payer: Self-pay | Admitting: General Surgery

## 2010-09-06 ENCOUNTER — Encounter: Payer: Self-pay | Admitting: Urology

## 2010-09-09 ENCOUNTER — Telehealth (INDEPENDENT_AMBULATORY_CARE_PROVIDER_SITE_OTHER): Payer: Self-pay | Admitting: *Deleted

## 2010-09-10 ENCOUNTER — Ambulatory Visit: Admit: 2010-09-10 | Payer: Self-pay | Admitting: Emergency Medicine

## 2010-09-15 ENCOUNTER — Inpatient Hospital Stay (HOSPITAL_COMMUNITY)
Admission: EM | Admit: 2010-09-15 | Discharge: 2010-09-20 | DRG: 194 | Disposition: A | Discharge status: Home / Self Care | Payer: Medicare Other | Attending: Internal Medicine | Admitting: Internal Medicine

## 2010-09-15 DIAGNOSIS — J4489 Other specified chronic obstructive pulmonary disease: Secondary | ICD-10-CM | POA: Diagnosis present

## 2010-09-15 DIAGNOSIS — E785 Hyperlipidemia, unspecified: Secondary | ICD-10-CM | POA: Diagnosis present

## 2010-09-15 DIAGNOSIS — K319 Disease of stomach and duodenum, unspecified: Secondary | ICD-10-CM | POA: Diagnosis present

## 2010-09-15 DIAGNOSIS — IMO0001 Reserved for inherently not codable concepts without codable children: Secondary | ICD-10-CM | POA: Diagnosis present

## 2010-09-15 DIAGNOSIS — E876 Hypokalemia: Secondary | ICD-10-CM | POA: Diagnosis present

## 2010-09-15 DIAGNOSIS — K9509 Other complications of gastric band procedure: Secondary | ICD-10-CM | POA: Diagnosis present

## 2010-09-15 DIAGNOSIS — J189 Pneumonia, unspecified organism: Principal | ICD-10-CM | POA: Diagnosis present

## 2010-09-15 DIAGNOSIS — G4733 Obstructive sleep apnea (adult) (pediatric): Secondary | ICD-10-CM | POA: Diagnosis present

## 2010-09-15 DIAGNOSIS — J449 Chronic obstructive pulmonary disease, unspecified: Secondary | ICD-10-CM | POA: Diagnosis present

## 2010-09-15 DIAGNOSIS — F341 Dysthymic disorder: Secondary | ICD-10-CM | POA: Diagnosis present

## 2010-09-15 DIAGNOSIS — Z940 Kidney transplant status: Secondary | ICD-10-CM

## 2010-09-15 DIAGNOSIS — I2510 Atherosclerotic heart disease of native coronary artery without angina pectoris: Secondary | ICD-10-CM | POA: Diagnosis present

## 2010-09-15 DIAGNOSIS — K219 Gastro-esophageal reflux disease without esophagitis: Secondary | ICD-10-CM | POA: Diagnosis present

## 2010-09-15 DIAGNOSIS — N189 Chronic kidney disease, unspecified: Secondary | ICD-10-CM | POA: Diagnosis present

## 2010-09-15 DIAGNOSIS — Z882 Allergy status to sulfonamides status: Secondary | ICD-10-CM

## 2010-09-15 DIAGNOSIS — I252 Old myocardial infarction: Secondary | ICD-10-CM

## 2010-09-15 LAB — DIFFERENTIAL
Basophils Absolute: 0 10*3/uL (ref 0.0–0.1)
Eosinophils Absolute: 0.1 10*3/uL (ref 0.0–0.7)
Lymphs Abs: 0.8 10*3/uL (ref 0.7–4.0)
Neutrophils Relative %: 84 % — ABNORMAL HIGH (ref 43–77)

## 2010-09-15 LAB — CBC
MCV: 86.4 fL (ref 78.0–100.0)
Platelets: 186 10*3/uL (ref 150–400)
RBC: 4.26 MIL/uL (ref 3.87–5.11)
RDW: 13.7 % (ref 11.5–15.5)
WBC: 12.4 10*3/uL — ABNORMAL HIGH (ref 4.0–10.5)

## 2010-09-15 LAB — COMPREHENSIVE METABOLIC PANEL
ALT: 17 U/L (ref 0–35)
AST: 29 U/L (ref 0–37)
Albumin: 3.3 g/dL — ABNORMAL LOW (ref 3.5–5.2)
Alkaline Phosphatase: 94 U/L (ref 39–117)
BUN: 14 mg/dL (ref 6–23)
Chloride: 103 mEq/L (ref 96–112)
GFR calc Af Amer: >60 mL/min (ref >60)
Potassium: 3.4 mEq/L — ABNORMAL LOW (ref 3.5–5.1)
Sodium: 137 mEq/L (ref 135–145)
Total Bilirubin: 0.4 mg/dL (ref 0.3–1.2)
Total Protein: 6.4 g/dL (ref 6.0–8.3)

## 2010-09-15 LAB — URINALYSIS, ROUTINE W REFLEX MICROSCOPIC
Bilirubin Urine: NEGATIVE
Hgb urine dipstick: NEGATIVE
Ketones, ur: NEGATIVE mg/dL
Nitrite: NEGATIVE
Specific Gravity, Urine: 1.012 (ref 1.005–1.030)
Urine Glucose, Fasting: NEGATIVE mg/dL
pH: 7 (ref 5.0–8.0)

## 2010-09-16 LAB — BASIC METABOLIC PANEL
BUN: 13 mg/dL (ref 6–23)
CO2: 29 mEq/L (ref 19–32)
Chloride: 104 mEq/L (ref 96–112)
Creatinine, Ser: 0.91 mg/dL (ref 0.4–1.2)
GFR calc Af Amer: >60 mL/min (ref >60)
Potassium: 3.7 mEq/L (ref 3.5–5.1)

## 2010-09-16 LAB — CBC
Hemoglobin: 11.9 g/dL — ABNORMAL LOW (ref 12.0–15.0)
MCH: 29.2 pg (ref 26.0–34.0)
MCV: 89.5 fL (ref 78.0–100.0)
Platelets: 180 10*3/uL (ref 150–400)
RBC: 4.08 MIL/uL (ref 3.87–5.11)
WBC: 6.5 10*3/uL (ref 4.0–10.5)

## 2010-09-16 LAB — CORTISOL: Cortisol, Plasma: 8.3 ug/dL

## 2010-09-16 NOTE — Progress Notes (Signed)
Summary: rx req/ singulair  Phone Note Call from Patient   Caller: Patient Call For: Katherine [name] Summary of Call: pt says she is out of singulair 10 mg  once a day.  [facility] [address]  Initial call taken by: Cooper Render,  February 01, 2008 9:49 AM  Follow-up for Phone Call        Called spoke with pt.  Dr Patsey Berthold rx Singulair for pt previously, but some how never was put on pt's med list.  Pt states she has been on everyday.  Is this OK to continue? Follow-up by: Freddrick March RN,  February 01, 2008 10:25 AM  Additional Follow-up for Phone Call Additional follow up Details #1::        OK to refill. [facility] Please call to let her know. Thanks.  Additional Follow-up by: Collene Gobble MD,  February 01, 2008 1:39 PM    Additional Follow-up for Phone Call Additional follow up Details #2::    Called spoke with pt advised rx for singulair sent to pharmacy. Follow-up by: Freddrick March RN,  February 01, 2008 2:40 PM  New/Updated Medications: SINGULAIR 10 MG  TABS (MONTELUKAST SODIUM) 1 by mouth once daily   Prescriptions: SINGULAIR 10 MG  TABS (MONTELUKAST SODIUM) 1 by mouth once daily  #30 x 11   Entered and Authorized by:   Collene Gobble MD   Signed by:   Collene Gobble MD on 02/01/2008   Method used:   [facility] ...       [facility]  [address] [id number]       [address]       [place], [place]  [address]       Ph: [phone] or [phone]       Fax: [phone]   RxID:   [id number]

## 2010-09-16 NOTE — Progress Notes (Signed)
Summary: rx for atrovent hfa > stay on the ventolin  Phone Note Call from Patient   Caller: Patient Call For: Byrum Summary of Call: pt is requesting rx for atrovent hfa. [facility] [address] [employer]  July 22, 2010 1:19 PM   Follow-up for Phone Call        pt would like to switch to the atrovent because it oes not make her as shaky as the ventolin does --pls advise if ok to change Follow-up by: Maryann Conners CMA,  July 22, 2010 3:58 PM  Additional Follow-up for Phone Call Additional follow up Details #1::        [facility] [address]- she's still waiting for this rx (she thought this was being called in yesterday). Cooper Render, CNA  July 23, 2010 11:44 AM  I don't really want to make this change. Her last PFT in 2009 do not show obstruction; I had left her on the albuterol as a safety net, just in case she developed intermittant SOB. Atrovent would be a bad choice for rescue IF she has asthma (which I doubt). If she is finding the need to use albuterol frequently, then I'd like to see her, probably repeat PFT's or do methacholine challenge to sort out whether she has true asthma. thanks Collene Gobble MD  July 23, 2010 1:25 PM  Additional Follow-up by: Collene Gobble MD,  July 23, 2010 1:25 PM    Additional Follow-up for Phone Call Additional follow up Details #2::    LMOM TCB x1. Parke Poisson CNA/MA  July 23, 2010 1:48 PM   patient returned call.  advised of RB's recs re: switching from albuterol hfa to atrovent.  pt verbalized her understanding.  pt states that she is using the albuterol hfa once daily but feels she has bronchitis.  unable to come this week, requested appt for Wed 12.14.11.  RB has no openings.  appt scheduled w/ TP 12.14.11 @ 1130.  pt okay with this date and time.  patient does request a refill on the ventolin.  RB, is this okay to send? Parke Poisson CNA/MA  July 23, 2010 1:58 PM   Yes thanks for  explaining to her Collene Gobble MD  July 23, 2010 2:12 PM   rx sent to [address]  left message on pt's named VM informing her of this. Parke Poisson CNA/MA  July 23, 2010 2:42 PM   New/Updated Medications: VENTOLIN HFA 108 (90 BASE) MCG/ACT  AERS (ALBUTEROL SULFATE) Inhale 2 puffs every four hours as needed Prescriptions: VENTOLIN HFA 108 (90 BASE) MCG/ACT  AERS (ALBUTEROL SULFATE) Inhale 2 puffs every four hours as needed  #1 x 1   Entered by:   Parke Poisson CNA/MA   Authorized by:   Collene Gobble MD   Signed by:   Parke Poisson CNA/MA on 07/23/2010   Method used:   Electronically to        [id number] (retail)       [address]       [place], [place]  [address]       Ph: [phone] or [phone]       Fax: [phone]   RxID:   [id number]

## 2010-09-16 NOTE — Assessment & Plan Note (Signed)
Summary: cough, upper airway irritation, GERD   Visit Type:  Follow-up  CC:  Follow up.  Pt states she was cleaning with [employer] last Friday - c/o increased SOB all the time and wheezing chest tighness and dry cough since then.  .  History of Present Illness: 55 year old female patient has a known history of obesity, status post lap band  surgery with significant weight loss.  Patient also has a history of renal transplant on chronic immunosuppression with Imuran and prednisone.  The patient has a history of sleep apnea now much improved after significant weight loss.   Still needs repeat PSG to prove that OSA has resolved.  She has stopped using CPAP.   Saw TP 07/17/08 for URI signs, bronchitis, sinusitis. Initially the nasal drainage cleared, now it is yellow again. Has had her lap band loosened due to problems with her port. Her reflux doesn't seem to have worsened, may actually be better with the band loosened.   May 15, 2009 --Present for an acute office visit. Complains of coughing and chest tightness/congestion x 4wks.  The cough has started a month ago and had one episode of bloody-tinged phlegm.   Didn't take any OTC to help the cough.  Has had some episode of vomitted after eating , this is chronic since lap band.  Denies chest pain,  abd. pain, melena. orthopnea, hemoptysis, fever, n/v/d, edema, headache  ROV 12/18/09 -- ROV to follow OSA/OHS, hx lap band sgy and GERD/reflux. Tells me that she was exposed to cleaning solution, bleach in the last week. Having some chest tightness, dry cough. Doing sinus rinses + nasonex. Taking omeprazole.   Current Medications (verified): 1)  Prilosec Otc 20 Mg  Tbec (Omeprazole Magnesium) .... Take 1 Tablet By Mouth Once A Day 2)  Ativan 1 Mg  Tabs (Lorazepam) .... Take 1 Tablet By Mouth Two Times A Day 3)  Bayer Aspirin Ec Low Dose 81 Mg  Tbec (Aspirin) .... Take 1 Tablet By Mouth Once A Day 4)  Zoloft 100 Mg  Tabs (Sertraline Hcl) ....  Take 2 Tablets At Bedtime 5)  Pravachol 80 Mg  Tabs (Pravastatin Sodium) .... Take 1 Tablet By Mouth Once A Day 6)  Prednisone 5 Mg  Tabs (Prednisone) .... Take 1 Tablet By Mouth Once A Day 7)  Imuran 50 Mg  Tabs (Azathioprine) .... 2 1/2 Tabs By Mouth Once Daily 8)  Plavix 75 Mg  Tabs (Clopidogrel Bisulfate) .... Take 1 Tablet By Mouth Once A Day 9)  Tramadol Hcl 50 Mg Tabs (Tramadol Hcl) .[name] Kitchen.. 1 Tab Three Times A Day As Needed 10)  Ventolin Hfa 108 (90 Base) Mcg/act  Aers (Albuterol Sulfate) .... As Needed 11)  Oxygen 2l .... At Bedtime As Needed 12)  Pulmicort 0.5 Mg/24ml  Susp (Budesonide) .... As Needed 13)  Singulair 10 Mg  Tabs (Montelukast Sodium) .[name] Kitchen.. 1 By Mouth Once Daily 14)  Nasonex 50 Mcg/act  Susp (Mometasone Furoate) .... Two Puffs Each Nostril Daily 15)  Nasal Saline Washes .... Once Daily 16)  Cymbalta 30 Mg Cpep (Duloxetine Hcl) .... Once Daily  Allergies (verified): 1)  ! Sulfa 2)  ! Codeine 3)  ! Erythromycin 4)  ! Tetracycline 5)  ! Nubain 6)  * Dilaudid  Vital Signs:  Patient profile:   55 year old female Height:      61 inches Weight:      163 pounds BMI:     30.91 O2 Sat:  100 % on Room air Temp:     98.1 degrees F oral Pulse rate:   55 / minute BP sitting:   114 / 80  (right arm) Cuff size:   regular  Vitals Entered By: Raymondo Band RN (Dec 18, 2009 2:22 PM)  O2 Flow:  Room air CC: Follow up.  Pt states she was cleaning with [employer] last Friday - c/o increased SOB all the time, wheezing chest tighness and dry cough since then.   Comments Medications reviewed with patient Daytime contact number verified with patient. Raymondo Band RN  Dec 18, 2009 2:22 PM     Impression & Recommendations:  Problem # 1:  COUGH, CHRONIC (ICD-786.2)  Upper airway irritation due to PND, GERD (poor control) and fume exposure. No evidence wheeze or asthma on exam - same regimen sinus disease - increase omeprazole to two times a day for 2 weeks - avoid  fumes - voice rest for two weeks - rov 1 yr  Orders: Est. Patient Level IV [id number]  Problem # 2:  GERD (ICD-530.81)  Her updated medication list for this problem includes:    Prilosec Otc 20 Mg Tbec (Omeprazole magnesium) .[name] Kitchen... Take 1 tablet by mouth once a day  Problem # 3:  SINUSITIS, CHRONIC (ICD-473.9)  Medications Added to Medication List This Visit: 1)  Tramadol Hcl 50 Mg Tabs (Tramadol hcl) .[name] Kitchen.. 1 tab three times a day as needed 2)  Cymbalta 30 Mg Cpep (Duloxetine hcl) .... Once daily  Patient Instructions: 1)  Continue your nasal saline rinses 2)  Continue your nasonex 3)  Stop mucinex 4)  Increase Prilosec to two times a day for two weeks then go back to once daily  5)  Avoid exposure to cleaning solutions, avoid working your voice (singing etc!) for 2 weeks.  6)  Follow up with Dr Lamonte Sakai as needed.

## 2010-09-16 NOTE — Progress Notes (Signed)
Summary: Singulair RX  Phone Note Call from Patient Call back at Home Phone [phone]   Caller: Patient Call For: byrum Reason for Call: Refill Medication, Talk to Nurse Summary of Call: pt has schedulled appt w/byrum.  Can you please send her a months supply until she comes in  in May - [place] Initial call taken by: Zigmund Gottron,  December 02, 2009 2:38 PM  Follow-up for Phone Call        The pt is aware of Singulair RX at pharmacy.Francesca Jewett CMA  December 02, 2009 3:40 PM    Prescriptions: SINGULAIR 10 MG  TABS (MONTELUKAST SODIUM) 1 by mouth once daily  #30 Tablet x 0   Entered by:   Francesca Jewett CMA   Authorized by:   Collene Gobble MD   Signed by:   Francesca Jewett CMA on 12/02/2009   Method used:   Electronically to        [id number]* (retail)       [address]       [place], [place]  [address]       Ph: [phone] or [phone]       Fax: [phone]   RxID:   [id number]

## 2010-09-16 NOTE — Assessment & Plan Note (Signed)
Summary: viral syndrome   Visit Type:  Follow-up  CC:  Acute Visit.  fever, body aches, fatigued, sinus congestion x 2days.  chest tightess, dry cough, and increased SOB "all the time" since yesterday.  [name] Kitchen  History of Present Illness: 55 year old female patient has a known history of obesity, status post lap band  surgery with significant weight loss.  Patient also has a history of renal transplant on chronic immunosuppression with Imuran and prednisone.  The patient has a history of sleep apnea now much improved after significant weight loss.   Still needs repeat PSG to prove that OSA has resolved.  She has stopped using CPAP.   May 15, 2009 --Present for an acute office visit. Complains of coughing and chest tightness/congestion x 4wks.  The cough has started a month ago and had one episode of bloody-tinged phlegm.   Didn't take any OTC to help the cough.  Has had some episode of vomitted after eating , this is chronic since lap band.  Denies chest pain,  abd. pain, melena. orthopnea, hemoptysis, fever, n/v/d, edema, headache  ROV 12/18/09 -- ROV to follow OSA/OHS, hx lap band sgy and GERD/reflux. Tells me that she was exposed to cleaning solution, bleach in the last week. Having some chest tightness, dry cough. Doing sinus rinses + nasonex. Taking omeprazole.   ROV 01/23/10 -- Acute visit. Has had fever, aches, head congestion, tight chest. Has had some dry cough, some nausea. Having trouble getting a deep breath.   Current Medications (verified): 1)  Prilosec Otc 20 Mg  Tbec (Omeprazole Magnesium) .... Take 1 Tablet By Mouth Once A Day 2)  Ativan 1 Mg  Tabs (Lorazepam) .... Take 1 Tablet By Mouth Two Times A Day 3)  Bayer Aspirin Ec Low Dose 81 Mg  Tbec (Aspirin) .... Take 1 Tablet By Mouth Once A Day 4)  Zoloft 100 Mg  Tabs (Sertraline Hcl) .... Take 2 Tablets At Bedtime 5)  Pravachol 80 Mg  Tabs (Pravastatin Sodium) .... Take 1 Tablet By Mouth Once A Day 6)  Prednisone 5 Mg  Tabs  (Prednisone) .... Take 1 Tablet By Mouth Once A Day 7)  Imuran 50 Mg  Tabs (Azathioprine) .... 2 1/2 Tabs By Mouth Once Daily 8)  Plavix 75 Mg  Tabs (Clopidogrel Bisulfate) .... Take 1 Tablet By Mouth Once A Day 9)  Tramadol Hcl 50 Mg Tabs (Tramadol Hcl) .[name] Kitchen.. 1 Tab Three Times A Day As Needed 10)  Ventolin Hfa 108 (90 Base) Mcg/act  Aers (Albuterol Sulfate) .... As Needed 11)  Oxygen 2l .... At Bedtime As Needed 12)  Pulmicort 0.5 Mg/61ml  Susp (Budesonide) .... As Needed 13)  Singulair 10 Mg  Tabs (Montelukast Sodium) .[name] Kitchen.. 1 By Mouth Once Daily 14)  Nasonex 50 Mcg/act  Susp (Mometasone Furoate) .... Two Puffs Each Nostril Daily 15)  Nasal Saline Washes .... Once Daily 16)  Cymbalta 30 Mg Cpep (Duloxetine Hcl) .... Once Daily  Allergies (verified): 1)  ! Sulfa 2)  ! Codeine 3)  ! Erythromycin 4)  ! Tetracycline 5)  ! Nubain 6)  * Dilaudid  Vital Signs:  Patient profile:   55 year old female Height:      61 inches Weight:      162 pounds BMI:     30.72 O2 Sat:      97 % on Room air Temp:     98.3 degrees F oral Pulse rate:   63 / minute BP sitting:   110 /  72  (right arm) Cuff size:   large  Vitals Entered By: Raymondo Band RN (January 23, 2010 2:20 PM)  O2 Flow:  Room air CC: Acute Visit.  fever, body aches, fatigued, sinus congestion x 2days.  chest tightess, dry cough, increased SOB "all the time" since yesterday.   Comments Medications reviewed with patient Daytime contact number verified with patient. Raymondo Band RN  January 23, 2010 2:23 PM    Physical Exam  Additional Exam:  GEN: A/Ox3; pleasant , NAD HEENT:  [place]/AT, , EACs-clear, NOSE-clear, THROAT-sl. red, no post nasal drip NECK:  Supple w/ fair ROM; no JVD; normal carotid impulses w/o bruits; no thyromegaly or nodules palpated; no lymphadenopathy. RESP  Clear to P & A; no crackles or wheeze on a forced expiration CARD:  RRR, no m/r/g   GI:   Soft & nt; nml bowel sounds; no organomegaly or masses detected. [place]:  Warm bil,  no calf tenderness edema, clubbing, pulses intact Neuro:,nml equal grips   Impression & Recommendations:  Problem # 1:  VIRAL URI (ICD-465.9)  Her updated medication list for this problem includes:    Bayer Aspirin Ec Low Dose 81 Mg Tbec (Aspirin) .[name] Kitchen... Take 1 tablet by mouth once a day  Orders: Est. Patient Level IV [id number])  Problem # 2:  OBSTRUCTIVE SLEEP APNEA (ICD-327.23)  Patient Instructions: 1)  Get plenty of rest and push fluids for the next several days 2)  You may use tylenol or Nyquil for your symptoms 3)  You may use albuterol as needed  4)  Call our office if you develop worsening cough, mucous production or blood in your sputum 5)  Otherwise follow up with Dr Lamonte Sakai in 6 months or as needed    Immunization History:  Influenza Immunization History:    Influenza:  historical (06/17/2009)

## 2010-09-17 LAB — BASIC METABOLIC PANEL
Calcium: 9.4 mg/dL (ref 8.4–10.5)
Creatinine, Ser: 0.84 mg/dL (ref 0.4–1.2)
GFR calc Af Amer: >60 mL/min (ref >60)
GFR calc non Af Amer: >60 mL/min (ref >60)
Sodium: 142 mEq/L (ref 135–145)

## 2010-09-17 LAB — CARDIAC PANEL(CRET KIN+CKTOT+MB+TROPI)
Relative Index: INVALID (ref 0.0–2.5)
Total CK: 32 U/L (ref 7–177)
Troponin I: 0.02 ng/mL (ref 0.00–0.06)

## 2010-09-17 LAB — CBC
MCH: 28.8 pg (ref 26.0–34.0)
MCHC: 32.6 g/dL (ref 30.0–36.0)
Platelets: 206 10*3/uL (ref 150–400)

## 2010-09-17 NOTE — H&P (Signed)
NAMESHANIQUIA, DOUGAL              ACCOUNT NO.:  [id number]  MEDICAL RECORD NO.:  [id number]          PATIENT TYPE:  INP  LOCATION:  4503                         FACILITY:  [facility]  PHYSICIAN:  Jacquelynn Cree, M.D.   DATE OF BIRTH:  Dec 13, 1955  DATE OF ADMISSION:  09/15/2010 DATE OF DISCHARGE:                             HISTORY & PHYSICAL   PRIMARY CARE PHYSICIAN:  None.  NEPHROLOGIST:  Dr. Jimmy Footman.  CHIEF COMPLAINT:  Chills, rapid heart rate.  HISTORY OF PRESENT ILLNESS:  The patient is a 55 year old female with a past medical history of proliferative glomerulonephritis, status post renal transplant in 1991 on chronic immunosuppressive therapy as well as chronic bronchitis, who presented to the hospital with a chief complaint of 2 months history of feeling weak and fatigued.  The patient states that shortly after she began to feel fatigued, she developed intermittent fevers.  She saw her pulmonologist and he did a chest x- ray, which was clear, but nevertheless treated her with a course of Levaquin and when her symptoms did not improve, she subsequently completed a 10-day course with Ceftin.  She states that each time on the antibiotics, her symptoms improved for a day or two but then returned after 2-3 days on the antibiotics.  She saw Dr. Jimmy Footman earlier today, who performed another chest radiograph, which confirmed a right mid lung pneumonia.  She was then instructed to come to the emergency department and referred for admission for treatment of her pneumonia.  The patient has had diminished appetite, intermittent nausea and vomiting, and nonproductive cough as well as pleuritic chest pain.  She has had some myalgias as well as some pharyngitis.  Nothing has significantly improved her symptoms, and there are no aggravating factors at this time.  PAST MEDICAL HISTORY: 1. Coronary artery disease/history of myocardial infarction, status     post coronary artery bypass  grafting in 1995 followed by multiple     stents. 2. Asthma. 3. Hypertension. 4. End-stage renal disease secondary to proliferative     glomerulonephritis, status post renal transplantation in 1991     (cadaveric). 5. Hyperlipidemia. 6. Chronic bronchitis. 7. Gastroesophageal reflux disease. 8. Paroxysmal atrial fibrillation. 9. Fibromyalgia. 10.History of type 2 diabetes, but no longer has this since she has     lost weight. 11.Anemia of chronic disease. 12.Obstructive sleep apnea, improved since weight loss. 13.History of morbid obesity, status post laparoscopic gastric banding     and subsequent significant weight loss. 14.Depression. 15.Anxiety.  PAST SURGICAL HISTORY: 1. Coronary artery bypass grafting. 2. Renal transplantation in 1991. 3. Laparoscopic gastric banding. 4. Tubal ligation. 5. History of mouth surgeries, status post dentures.  FAMILY HISTORY:  The patient's mother died at age 78 from cirrhosis. She also had liver cancer.  The patient's father died of colon cancer. He also had heart disease and a clotting disorder.  She has one sister, who recently finished chemotherapy for colon cancer.  She has two brothers with renal disease and one with liver cancer.  She does not have any offspring.  SOCIAL HISTORY:  The patient is married.  She has a history of smoking, approximately  30 pack years, but no longer smokes.  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  She denies any alcohol or drug use.  ALLERGIES:  CODEINE, ERYTHROMYCIN, NUBAIN, SULFA, and DILAUDID.  CURRENT MEDICATIONS: 1. Prilosec OTC 20 mg p.o. daily. 2. Ativan 1 mg p.o. b.i.d. 3. Aspirin 81 mg p.o. daily. 4. Zoloft 200 mg p.o. at bedtime. 5. Pravachol 80 mg p.o. daily. 6. Prednisone 5 mg p.o. daily. 7. Imuran 50 mg 2-1/2 tablets p.o. daily. 8. Plavix 75 mg p.o. daily. 9. Tramadol 50 mg p.o. t.i.d. p.r.n. pain. 10.ProAir HFA 2 puffs q.4 h p.r.n. wheezing. 11.Oxygen  at 2 liters at bedtime as needed. 12.Pulmicort 0.5 mg/2 mL as needed. 13.Singulair 10 mg p.o. daily. 14.Nasonex 50 mcg 2 puffs in each nostril daily. 15.Nasal saline washes, once daily. 16.Cymbalta 30 mg p.o. daily. 17.Flexeril 10 mg p.o. at bedtime.  REVIEW OF SYSTEMS:  A comprehensive 14-point review of systems was completed and is noted in the elements of the HPI above.  Other pertinent positives are complaint of recent dysuria and hemorrhoidal bleeding.  Pertinent negatives are no melena or hematochezia other than mild hemorrhoidal bleeding.  PHYSICAL EXAMINATION:  VITAL SIGNS:  Temperature 98, pulse 80, respirations 11, blood pressure 119/70, O2 saturation 99% on room air. GENERAL:  Well-developed, well-nourished female in no acute distress. HEENT:  Normocephalic, atraumatic.  PERRL.  EOMI.  Sclerae nonicteric. Oropharynx is clear.  Dentures are intact.  Moist mucous membranes. NECK:  Supple, no thyromegaly, no lymphadenopathy, no jugular venous distention. CHEST:  Lungs are clear to auscultation bilaterally with good air movement. HEART:  Regular rate, rhythm.  No murmurs, rubs, or gallops. ABDOMEN:  Soft, nontender, nondistended with normoactive bowel sounds. EXTREMITIES:  No clubbing, edema, cyanosis. SKIN:  Warm and dry.  No rashes. NEUROLOGIC:  The patient is alert and oriented x3.  Cranial nerves II through XII are grossly intact.  Nonfocal.  DATA REVIEW:  Chest x-ray shows new focal airspace disease in the right midlung compatible with pneumonia.  LABORATORY DATA:  White blood cell count is 12.4, hemoglobin 12.1, hematocrit 36.8, platelets 186.  Sodium is 137, potassium 3.4, chloride 103, bicarb 23, BUN 14, creatinine 0.94, glucose 149.  Liver function studies are within normal limits.  Magnesium is 1.8 and phosphorus is 3.4.  Urinalysis is negative for nitrites and leukocyte esterase.  ASSESSMENT AND PLAN: 1. Community-acquired pneumonia:  The patient has failed  outpatient     antibiotics and has multiple exposures to the Health Care     environment and therefore we will treat her with vancomycin to     cover MRSA and Zosyn to cover Pseudomonas.  She does not currently     have a productive cough, so we will be unable to obtain sputum     cultures.  She is not currently febrile but if she becomes febrile,     we will obtain blood cultures x2. 2. Hypokalemia:  We will replete with 40 mEq p.o. 3. Coronary artery disease:  Continue the patient's aspirin and     Plavix.  She is not currently complaining of any symptoms     suggestive of an acute coronary syndrome. 4. Gastroesophageal reflux disease:  Continue the patient's Prilosec. 5. Depression/anxiety:  Continue the patient's Ativan, Zoloft, and     Cymbalta. 6. Dyslipidemia:  Continue the patient's Pravachol.  Her liver     function studies are within normal limits. 7. Chronic immunosuppressive therapy secondary to history  of end-stage     renal disease and subsequent renal transplantation:  The patient's     blood pressure is currently stable and at this time, she has no     evidence of adrenal insufficiency.  If she drops her blood     pressure, we will put her on stress dose steroids, but at this     time, we will just continue her prednisone and Imuran at her home     doses. 8. Asthma:  Continue the patient's Singulair and place her on     nebulized bronchodilator therapy. 9. Fibromyalgia:  Continue the patient's Cymbalta and at bedtime     Flexeril. 10.Prophylaxis:  Initiate Lovenox for deep venous thrombosis     prophylaxis.  Time spent on admission including face-to-face time equals approximately 1 hour.     Jacquelynn Cree, M.D.     CR/MEDQ  D:  09/15/2010  T:  09/16/2010  Job:  [id number]  cc:   Dr. Jimmy Footman  Electronically Signed by Jacquelynn Cree M.D. on 09/17/2010 05:07:52 PM

## 2010-09-18 LAB — CARDIAC PANEL(CRET KIN+CKTOT+MB+TROPI)
CK, MB: 2.5 ng/mL (ref 0.3–4.0)
Relative Index: INVALID (ref 0.0–2.5)
Total CK: 56 U/L (ref 7–177)
Troponin I: 0.01 ng/mL (ref 0.00–0.06)

## 2010-09-18 NOTE — Progress Notes (Signed)
Summary: change Xopenex HFA to Proair due to insurance coverage  Phone Note Outgoing Call   Call placed by: Francesca Jewett CMA,  August 07, 2010 3:52 PM Call placed to: [facility] [phone] Summary of Call: PA requested for Xopenex HFA inhaler. Preferred medication is Proair. Pls advise if okay to change to Proair or try for PA.  Member ID# [id number] per pharmacy form. Initial call taken by: Francesca Jewett CMA,  August 07, 2010 3:54 PM  Follow-up for Phone Call        proair is fine  2 puffs every 4 hr as needed wheezing  #1,  3 refills  Follow-up by: Rexene Edison NP,  August 07, 2010 4:00 PM  Additional Follow-up for Phone Call Additional follow up Details #1::        Pt is aware and we will send new RX for Proair to [facility] on [address].Francesca Jewett CMA  August 07, 2010 4:21 PM    New/Updated Medications: PROAIR HFA 108 (90 BASE) MCG/ACT AERS (ALBUTEROL SULFATE) 2 puffs every 4 hours as needed for wheezing Prescriptions: PROAIR HFA 108 (90 BASE) MCG/ACT AERS (ALBUTEROL SULFATE) 2 puffs every 4 hours as needed for wheezing  #1 x 3   Entered by:   Francesca Jewett CMA   Authorized by:   Rexene Edison NP   Signed by:   Francesca Jewett CMA on 08/07/2010   Method used:   Electronically to        [id number] (retail)       [address]       [place], [place]  [address]       Ph: [phone] or [phone]       Fax: [phone]   RxID:   [id number]

## 2010-09-18 NOTE — Letter (Signed)
Summary: [facility] [place] By: Rise Patience 09/12/2010 12:52:36  _____________________________________________________________________  External Attachment:    Type:   Image     Comment:   External Document

## 2010-09-18 NOTE — Assessment & Plan Note (Signed)
Summary: NP follow up   Visit Type:  NP follow up Primary Provider/Referring Provider:  n/a  CC:  Pt still c/o sinus pressure/congestion, PND and sore throat, right ear discomfort, prod cough with yellow mucus, increased SOB, and lack of energy and wheezing. Pt completed 5 day Levaquin 750mg  and prednisone taper.  History of Present Illness: 55 year old female patient has a known history of obesity, status post lap band  surgery with significant weight loss.  Patient also has a history of renal transplant on chronic immunosuppression with Imuran and prednisone.  The patient has a history of sleep apnea now much improved after significant weight loss.   Still needs repeat PSG to prove that OSA has resolved.  She has stopped using CPAP.   May 15, 2009 --Present for an acute office visit. Complains of coughing and chest tightness/congestion x 4wks.  The cough has started a month ago and had one episode of bloody-tinged phlegm.   Didn't take any OTC to help the cough.  Has had some episode of vomitted after eating , this is chronic since lap band.  Denies chest pain,  abd. pain, melena. orthopnea, hemoptysis, fever, n/v/d, edema, headache  ROV 12/18/09 -- ROV to follow OSA/OHS, hx lap band sgy and GERD/reflux. Tells me that she was exposed to cleaning solution, bleach in the last week. Having some chest tightness, dry cough. Doing sinus rinses + nasonex. Taking omeprazole.   ROV 01/23/10 --Has had fever, aches, head congestion, tight chest. Has had some dry cough, some nausea. Having trouble getting a deep breath.   July 30, 2010 --Presents for an acute office viist. Complains of sinus pressure/congestion, PND and sore throat, right ear discomfort, prod cough with yellow mucus, increased SOB, wheezing x 2 weeks . Has been doing well until last 2 weeks. Does not want to be sick for the holidays, family is coming in to visit.Denies chest pain, orthopnea, hemoptysis, fever, n/v/d, edema.  Has alot  of sinus congestion and pressure.   August 20, 2010 --Presents for an acute office visit. Complains of sinus pressure/congestion, PND and sore throat, right ear discomfort, prod cough with yellow mucus, increased SOB, lack of energy and wheezing. Pt completed 5 day Levaquin 750mg  and prednisone taper 2 weeks ago with some improvement but never went away and now sympotms are getting worse.  No vomitting or reflx. Denies chest pain, orthopnea, hemoptysis, fever, n/v/d, edema, headache.  Preventive Screening-Counseling & Management  Alcohol-Tobacco     Smoking Status: quit     Pack years: smoked for 25 years  Medications Prior to Update: 1)  Prilosec Otc 20 Mg  Tbec (Omeprazole Magnesium) .... Take 1 Tablet By Mouth Once A Day 2)  Ativan 1 Mg  Tabs (Lorazepam) .... Take 1 Tablet By Mouth Two Times A Day 3)  Bayer Aspirin Ec Low Dose 81 Mg  Tbec (Aspirin) .... Take 1 Tablet By Mouth Once A Day 4)  Zoloft 100 Mg  Tabs (Sertraline Hcl) .... Take 2 Tablets At Bedtime 5)  Pravachol 80 Mg  Tabs (Pravastatin Sodium) .... Take 1 Tablet By Mouth Once A Day 6)  Prednisone 5 Mg  Tabs (Prednisone) .... Take 1 Tablet By Mouth Once A Day 7)  Imuran 50 Mg  Tabs (Azathioprine) .... 2 1/2 Tabs By Mouth Once Daily 8)  Plavix 75 Mg  Tabs (Clopidogrel Bisulfate) .... Take 1 Tablet By Mouth Once A Day 9)  Tramadol Hcl 50 Mg Tabs (Tramadol Hcl) .[name] Kitchen.. 1 Tab Three Times  A Day As Needed 10)  Proair Hfa 108 (90 Base) Mcg/act Aers (Albuterol Sulfate) .... 2 Puffs Every 4 Hours As Needed For Wheezing 11)  Oxygen 2l .... At Bedtime As Needed 12)  Pulmicort 0.5 Mg/68ml  Susp (Budesonide) .... As Needed 13)  Singulair 10 Mg  Tabs (Montelukast Sodium) .[name] Kitchen.. 1 By Mouth Once Daily 14)  Nasonex 50 Mcg/act  Susp (Mometasone Furoate) .... Two Puffs Each Nostril Daily 15)  Nasal Saline Washes .... Once Daily 16)  Cymbalta 30 Mg Cpep (Duloxetine Hcl) .... Once Daily 17)  Levaquin 750 Mg Tabs (Levofloxacin) .[name] Kitchen.. 1 By Mouth Once  Daily 18)  Prednisone 10 Mg Tabs (Prednisone) .... 4 Tabs For 2 Days, Then 3 Tabs For 2 Days, 2 Tabs For 2 Days, Then 1 Tab For 2 Days, Then Back To Regular Dose  Current Medications (verified): 1)  Prilosec Otc 20 Mg  Tbec (Omeprazole Magnesium) .... Take 1 Tablet By Mouth Once A Day 2)  Ativan 1 Mg  Tabs (Lorazepam) .... Take 1 Tablet By Mouth Two Times A Day 3)  Bayer Aspirin Ec Low Dose 81 Mg  Tbec (Aspirin) .... Take 1 Tablet By Mouth Once A Day 4)  Zoloft 100 Mg  Tabs (Sertraline Hcl) .... Take 2 Tablets At Bedtime 5)  Pravachol 80 Mg  Tabs (Pravastatin Sodium) .... Take 1 Tablet By Mouth Once A Day 6)  Prednisone 5 Mg  Tabs (Prednisone) .... Take 1 Tablet By Mouth Once A Day 7)  Imuran 50 Mg  Tabs (Azathioprine) .... 2 1/2 Tabs By Mouth Once Daily 8)  Plavix 75 Mg  Tabs (Clopidogrel Bisulfate) .... Take 1 Tablet By Mouth Once A Day 9)  Tramadol Hcl 50 Mg Tabs (Tramadol Hcl) .[name] Kitchen.. 1 Tab Three Times A Day As Needed 10)  Proair Hfa 108 (90 Base) Mcg/act Aers (Albuterol Sulfate) .... 2 Puffs Every 4 Hours As Needed For Wheezing 11)  Oxygen 2l .... At Bedtime As Needed 12)  Pulmicort 0.5 Mg/69ml  Susp (Budesonide) .... As Needed 13)  Singulair 10 Mg  Tabs (Montelukast Sodium) .[name] Kitchen.. 1 By Mouth Once Daily 14)  Nasonex 50 Mcg/act  Susp (Mometasone Furoate) .... Two Puffs Each Nostril Daily 15)  Nasal Saline Washes .... Once Daily 16)  Cymbalta 30 Mg Cpep (Duloxetine Hcl) .... Once Daily 17)  Flexeril 10 Mg Tabs (Cyclobenzaprine Hcl) .... Take 1 Tab By Mouth At Bedtime  Allergies (verified): 1)  ! Sulfa 2)  ! Codeine 3)  ! Erythromycin 4)  ! Tetracycline 5)  ! Nubain 6)  * Dilaudid  Past History:  Past Medical History: Last updated: 02/06/2008  COUGH, CHRONIC (ICD-786.2) OBSTRUCTIVE SLEEP APNEA (ICD-327.23) GERD (ICD-530.81) SINUSITIS, CHRONIC (ICD-473.9) ASTHMATIC BRONCHITIS, ACUTE (ICD-466.0) s/p renal transplant 1991 obesity s/o lap band 2006    Family History: Last  updated: 05/15/2009 diabetes in mother and brother allergies in all 7 siblings asthma in grandmother, mother, aunts, sister heart disease and clotting disorder in father arthritis in grandmother, mother father had colon cancer mother had liver cancer brother had liver cancer aunt and cousin had breast cancer 1 of the seven sibings had renal disease, transplant in 2007  Social History: Last updated: 07/17/2008 separated former smoker-2004 no kids states exercises 3x a week drinks 3 cups caffeine a day  Risk Factors: Smoking Status: quit (08/20/2010)  Review of Systems      See HPI  Vital Signs:  Patient profile:   55 year old female Height:      40  inches Weight:      164.6 pounds BMI:     31.21 O2 Sat:      100 % on Room air Temp:     97.5 degrees F oral Pulse rate:   58 / minute BP sitting:   108 / 70  (right arm) Cuff size:   large  Vitals Entered By: Iran Planas CMA (August 20, 2010 9:26 AM)  O2 Flow:  Room air CC: Pt still c/o sinus pressure/congestion, PND and sore throat, right ear discomfort, prod cough with yellow mucus, increased SOB, lack of energy and wheezing. Pt completed 5 day Levaquin 750mg  and prednisone taper Is Patient Diabetic? No Comments Medications reviewed with patient Verified contact number and pharmacy with patient Iran Planas CMA  August 20, 2010 9:25 AM    Physical Exam  Additional Exam:  GEN: A/Ox3; pleasant , NAD HEENT:  [place]/AT, , EACs-clear, NOSE-clear drainage max tenderness  THROAT-sl. red, no post nasal drip NECK:  Supple w/ fair ROM; no JVD; normal carotid impulses w/o bruits; no thyromegaly or nodules palpated; no lymphadenopathy. RESP  coarse BS w/ no wheezing  CARD:  RRR, no m/r/g   GI:   Soft & nt; nml bowel sounds; no organomegaly or masses detected. [place]: Warm bil,  no calf tenderness edema, clubbing, pulses intact Neuro:,nml equal grips   Impression & Recommendations:  Problem # 1:  ASTHMATIC  BRONCHITIS, ACUTE (ICD-466.0)  Flare w/ associated sinusitits xray pending.  Plan:  Omnicef 300mg  two times a day for 10days Mucinex DM two times a day as needed cough/congesetion  Saline nasal rinses as needed  Increase fluids and rest.  I will call with xray resullts.  Please contact office for sooner follow up if symptoms do not improve or worsen  follow up Dr. Lamonte Sakai in 3 weeeks as planned and as needed  The following medications were removed from the medication list:    Levaquin 750 Mg Tabs (Levofloxacin) .[name] Kitchen... 1 by mouth once daily Her updated medication list for this problem includes:    Proair Hfa 108 (90 Base) Mcg/act Aers (Albuterol sulfate) .[name] Kitchen... 2 puffs every 4 hours as needed for wheezing    Pulmicort 0.5 Mg/65ml Susp (Budesonide) .[name] Kitchen... As needed    Singulair 10 Mg Tabs (Montelukast sodium) .[name] Kitchen... 1 by mouth once daily    Cefdinir 300 Mg Caps (Cefdinir) .[name] Kitchen... 1 by mouth two times a day  Orders: T-2 View CXR (71020TC) Est. Patient Level IV [id number])  Medications Added to Medication List This Visit: 1)  Flexeril 10 Mg Tabs (Cyclobenzaprine hcl) .... Take 1 tab by mouth at bedtime 2)  Cefdinir 300 Mg Caps (Cefdinir) .[name] Kitchen.. 1 by mouth two times a day  Patient Instructions: 1)  Omnicef 300mg  two times a day for 10days 2)  Mucinex DM two times a day as needed cough/congesetion  3)  Saline nasal rinses as needed  4)  Increase fluids and rest.  5)  I will call with xray resullts.  6)  Please contact office for sooner follow up if symptoms do not improve or worsen  7)  follow up Dr. Lamonte Sakai in 3 weeeks as planned and as needed  Prescriptions: CEFDINIR 300 MG CAPS (CEFDINIR) 1 by mouth two times a day  #20 x 0   Entered and Authorized by:   Rexene Edison NP   Signed by:   Prim Morace NP on 08/20/2010   Method used:   [facility]  [id number]       [address]       [place],   [address]       Ph: [phone] or  [phone]       Fax: [phone]   RxID:   [id number]

## 2010-09-18 NOTE — Assessment & Plan Note (Signed)
Summary: Acute NP office visit - bronchitis   CC:  sinus pressure/congestion, PND and sore throat, right ear discomfort, prod cough with yellow mucus, increased SOB, and wheezing .  History of Present Illness: 55 year old female patient has a known history of obesity, status post lap band  surgery with significant weight loss.  Patient also has a history of renal transplant on chronic immunosuppression with Imuran and prednisone.  The patient has a history of sleep apnea now much improved after significant weight loss.   Still needs repeat PSG to prove that OSA has resolved.  She has stopped using CPAP.   May 15, 2009 --Present for an acute office visit. Complains of coughing and chest tightness/congestion x 4wks.  The cough has started a month ago and had one episode of bloody-tinged phlegm.   Didn't take any OTC to help the cough.  Has had some episode of vomitted after eating , this is chronic since lap band.  Denies chest pain,  abd. pain, melena. orthopnea, hemoptysis, fever, n/v/d, edema, headache  ROV 12/18/09 -- ROV to follow OSA/OHS, hx lap band sgy and GERD/reflux. Tells me that she was exposed to cleaning solution, bleach in the last week. Having some chest tightness, dry cough. Doing sinus rinses + nasonex. Taking omeprazole.   ROV 01/23/10 -- Acute visit. Has had fever, aches, head congestion, tight chest. Has had some dry cough, some nausea. Having trouble getting a deep breath.  July 30, 2010 --Presents for an acute office viist. Complains of sinus pressure/congestion, PND and sore throat, right ear discomfort, prod cough with yellow mucus, increased SOB, wheezing x 2 weeks . Has been doing well until last 2 weeks. Does not want to be sick for the holidays, family is coming in to visit.Denies chest pain, orthopnea, hemoptysis, fever, n/v/d, edema.  Has alot of sinus congestion and pressure.   Medications Prior to Update: 1)  Prilosec Otc 20 Mg  Tbec (Omeprazole Magnesium) ....  Take 1 Tablet By Mouth Once A Day 2)  Ativan 1 Mg  Tabs (Lorazepam) .... Take 1 Tablet By Mouth Two Times A Day 3)  Bayer Aspirin Ec Low Dose 81 Mg  Tbec (Aspirin) .... Take 1 Tablet By Mouth Once A Day 4)  Zoloft 100 Mg  Tabs (Sertraline Hcl) .... Take 2 Tablets At Bedtime 5)  Pravachol 80 Mg  Tabs (Pravastatin Sodium) .... Take 1 Tablet By Mouth Once A Day 6)  Prednisone 5 Mg  Tabs (Prednisone) .... Take 1 Tablet By Mouth Once A Day 7)  Imuran 50 Mg  Tabs (Azathioprine) .... 2 1/2 Tabs By Mouth Once Daily 8)  Plavix 75 Mg  Tabs (Clopidogrel Bisulfate) .... Take 1 Tablet By Mouth Once A Day 9)  Tramadol Hcl 50 Mg Tabs (Tramadol Hcl) .[name] Kitchen.. 1 Tab Three Times A Day As Needed 10)  Ventolin Hfa 108 (90 Base) Mcg/act  Aers (Albuterol Sulfate) .... Inhale 2 Puffs Every Four Hours As Needed 11)  Oxygen 2l .... At Bedtime As Needed 12)  Pulmicort 0.5 Mg/19ml  Susp (Budesonide) .... As Needed 13)  Singulair 10 Mg  Tabs (Montelukast Sodium) .[name] Kitchen.. 1 By Mouth Once Daily 14)  Nasonex 50 Mcg/act  Susp (Mometasone Furoate) .... Two Puffs Each Nostril Daily 15)  Nasal Saline Washes .... Once Daily 16)  Cymbalta 30 Mg Cpep (Duloxetine Hcl) .... Once Daily  Current Medications (verified): 1)  Prilosec Otc 20 Mg  Tbec (Omeprazole Magnesium) .... Take 1 Tablet By Mouth Once A Day 2)  Ativan 1 Mg  Tabs (Lorazepam) .... Take 1 Tablet By Mouth Two Times A Day 3)  Bayer Aspirin Ec Low Dose 81 Mg  Tbec (Aspirin) .... Take 1 Tablet By Mouth Once A Day 4)  Zoloft 100 Mg  Tabs (Sertraline Hcl) .... Take 2 Tablets At Bedtime 5)  Pravachol 80 Mg  Tabs (Pravastatin Sodium) .... Take 1 Tablet By Mouth Once A Day 6)  Prednisone 5 Mg  Tabs (Prednisone) .... Take 1 Tablet By Mouth Once A Day 7)  Imuran 50 Mg  Tabs (Azathioprine) .... 2 1/2 Tabs By Mouth Once Daily 8)  Plavix 75 Mg  Tabs (Clopidogrel Bisulfate) .... Take 1 Tablet By Mouth Once A Day 9)  Tramadol Hcl 50 Mg Tabs (Tramadol Hcl) .[name] Kitchen.. 1 Tab Three Times A Day As  Needed 10)  Ventolin Hfa 108 (90 Base) Mcg/act  Aers (Albuterol Sulfate) .... Inhale 2 Puffs Every Four Hours As Needed 11)  Oxygen 2l .... At Bedtime As Needed 12)  Pulmicort 0.5 Mg/81ml  Susp (Budesonide) .... As Needed 13)  Singulair 10 Mg  Tabs (Montelukast Sodium) .[name] Kitchen.. 1 By Mouth Once Daily 14)  Nasonex 50 Mcg/act  Susp (Mometasone Furoate) .... Two Puffs Each Nostril Daily 15)  Nasal Saline Washes .... Once Daily 16)  Cymbalta 30 Mg Cpep (Duloxetine Hcl) .... Once Daily  Allergies (verified): 1)  ! Sulfa 2)  ! Codeine 3)  ! Erythromycin 4)  ! Tetracycline 5)  ! Nubain 6)  * Dilaudid  Past History:  Past Medical History: Last updated: 02/06/2008  COUGH, CHRONIC (ICD-786.2) OBSTRUCTIVE SLEEP APNEA (ICD-327.23) GERD (ICD-530.81) SINUSITIS, CHRONIC (ICD-473.9) ASTHMATIC BRONCHITIS, ACUTE (ICD-466.0) s/p renal transplant 1991 obesity s/o lap band 2006    Family History: Last updated: 05/15/2009 diabetes in mother and brother allergies in all 7 siblings asthma in grandmother, mother, aunts, sister heart disease and clotting disorder in father arthritis in grandmother, mother father had colon cancer mother had liver cancer brother had liver cancer aunt and cousin had breast cancer 1 of the seven sibings had renal disease, transplant in 2007  Social History: Last updated: 07/17/2008 separated former smoker-2004 no kids states exercises 3x a week drinks 3 cups caffeine a day  Risk Factors: Smoking Status: quit (09/09/2007)  Review of Systems      See HPI  Vital Signs:  Patient profile:   55 year old female Height:      61 inches Weight:      161 pounds BMI:     30.53 O2 Sat:      99 % on Room air Temp:     97.8 degrees F oral Pulse rate:   62 / minute BP sitting:   124 / 76  (right arm) Cuff size:   large  Vitals Entered By: Parke Poisson CNA/MA (July 30, 2010 11:44 AM)  O2 Flow:  Room air CC: sinus pressure/congestion, PND and sore throat,  right ear discomfort, prod cough with yellow mucus, increased SOB, wheezing  Is Patient Diabetic? No Comments Medications reviewed with patient Daytime contact number verified with patient. Parke Poisson CNA/MA  July 30, 2010 11:44 AM    Physical Exam  Additional Exam:  GEN: A/Ox3; pleasant , NAD HEENT:  [place]/AT, , EACs-clear, NOSE-clear drainage max tenderness  THROAT-sl. red, no post nasal drip NECK:  Supple w/ fair ROM; no JVD; normal carotid impulses w/o bruits; no thyromegaly or nodules palpated; no lymphadenopathy. RESP  coarse BS w/  CARD:  RRR, no m/r/g   GI:  Soft & nt; nml bowel sounds; no organomegaly or masses detected. [place]: Warm bil,  no calf tenderness edema, clubbing, pulses intact Neuro:,nml equal grips   Impression & Recommendations:  Problem # 1:  ASTHMATIC BRONCHITIS, ACUTE (ICD-466.0) Flare w/ associated sinusitis Plan:  Levaquin 750mg  once daily for 5 days Mucinex DM two times a day as needed cough/congesetion  Increase fluids and rest.  Please contact office for sooner follow up if symptoms do not improve or worsen  follow up Dr. Lamonte Sakai in 4 weeks and as needed   Her updated medication list for this problem includes:    Xopenex Hfa 45 Mcg/act Aero (Levalbuterol tartrate) .[name] Kitchen... 2 puffs every 4 hr as needed for wheezing    Pulmicort 0.5 Mg/41ml Susp (Budesonide) .[name] Kitchen... As needed    Singulair 10 Mg Tabs (Montelukast sodium) .[name] Kitchen... 1 by mouth once daily    Levaquin 750 Mg Tabs (Levofloxacin) .[name] Kitchen... 1 by mouth once daily  Orders: Est. Patient Level IV [id number])  Medications Added to Medication List This Visit: 1)  Xopenex Hfa 45 Mcg/act Aero (Levalbuterol tartrate) .... 2 puffs every 4 hr as needed for wheezing 2)  Levaquin 750 Mg Tabs (Levofloxacin) .[name] Kitchen.. 1 by mouth once daily  Complete Medication List: 1)  Prilosec Otc 20 Mg Tbec (Omeprazole magnesium) .... Take 1 tablet by mouth once a day 2)  Ativan 1 Mg Tabs (Lorazepam) .... Take 1 tablet by mouth two  times a day 3)  Bayer Aspirin Ec Low Dose 81 Mg Tbec (Aspirin) .... Take 1 tablet by mouth once a day 4)  Zoloft 100 Mg Tabs (Sertraline hcl) .... Take 2 tablets at bedtime 5)  Pravachol 80 Mg Tabs (Pravastatin sodium) .... Take 1 tablet by mouth once a day 6)  Prednisone 5 Mg Tabs (Prednisone) .... Take 1 tablet by mouth once a day 7)  Imuran 50 Mg Tabs (Azathioprine) .... 2 1/2 tabs by mouth once daily 8)  Plavix 75 Mg Tabs (Clopidogrel bisulfate) .... Take 1 tablet by mouth once a day 9)  Tramadol Hcl 50 Mg Tabs (Tramadol hcl) .[name] Kitchen.. 1 tab three times a day as needed 10)  Xopenex Hfa 45 Mcg/act Aero (Levalbuterol tartrate) .... 2 puffs every 4 hr as needed for wheezing 11)  Oxygen 2l  .... At bedtime as needed 12)  Pulmicort 0.5 Mg/64ml Susp (Budesonide) .... As needed 13)  Singulair 10 Mg Tabs (Montelukast sodium) .[name] Kitchen.. 1 by mouth once daily 14)  Nasonex 50 Mcg/act Susp (Mometasone furoate) .... Two puffs each nostril daily 15)  Nasal Saline Washes  .... Once daily 16)  Cymbalta 30 Mg Cpep (Duloxetine hcl) .... Once daily 17)  Levaquin 750 Mg Tabs (Levofloxacin) .[name] Kitchen.. 1 by mouth once daily  Patient Instructions: 1)  Levaquin 750mg  once daily for 5 days 2)  Mucinex DM two times a day as needed cough/congesetion  3)  Increase fluids and rest.  4)  Please contact office for sooner follow up if symptoms do not improve or worsen  5)  follow up Dr. Lamonte Sakai in 4 weeks and as needed  Prescriptions: LEVAQUIN 750 MG TABS (LEVOFLOXACIN) 1 by mouth once daily  #5 x 0   Entered and Authorized by:   Rexene Edison NP   Signed by:   Tammy Parrett NP on 07/30/2010   Method used:   Electronically to        [facility]. [id number]* (retail)       [address]  [place], [place]  [address]       Ph: [phone] or [phone]       Fax: [phone]   RxID:   [id number] XOPENEX HFA 45 MCG/ACT AERO (LEVALBUTEROL TARTRATE) 2 puffs every 4 hr as needed for wheezing  #1 x 3    Entered and Authorized by:   Rexene Edison NP   Signed by:   Tammy Parrett NP on 07/30/2010   Method used:   Electronically to        [facility]. [id number]* (retail)       [address]       [place], [place]  [address]       Ph: [phone] or [phone]       Fax: [phone]   RxID:   [id number]    Immunization History:  Influenza Immunization History:    Influenza:  historical (06/17/2010)

## 2010-09-18 NOTE — Progress Notes (Signed)
Summary: fyi pt canceled appt for 09/10/10  Phone Note Call from Patient   Caller: Patient Call For: DR [facility] Summary of Call: [place]. Patient phoned stated that she saw her Kidney doctor Dr. Archie Balboa this morning and he is running labs on her to see if he can find out why she is continuing to run a fever. She canceled her appointment for 09/10/10 because it was basically the same reason. She stated that she will call back to make her yearly follow up with Dr. Lamonte Sakai.   Follow-up for Phone Call        called and spoke with pt. pt recently saw TP for a sick visit on 08/20/2010 and was supposed to f/u with [facility] on 09-10-2010.  Pt states she cancelled this appt and is going to see Dr. Archie Balboa instead because she is concerned these symptoms are d/t her kidney rejecting.  Dr. Jimmy Footman did bloodwork and is going to see her next week.  Pt does c/o fever between 99 to 101.0, nausea, vomiting, sinus pressure, "lungs hurt when she breathes in," increased sob, and cough but unable to get sputum up dispite using Mucinex.   Will forward message to [place].  Jinny Blossom Reynolds LPN  January 24, [id number] 11:23 AM   Additional Follow-up for Phone Call Additional follow up Details #1::        She needs a CXR - can get this at her Appt w Dr Jimmy Footman and have results forwarded to me. Can cancel the 1/25 appt. Collene Gobble MD  September 09, 2010 3:33 PM  Additional Follow-up by: Collene Gobble MD,  September 09, 2010 3:33 PM    Additional Follow-up for Phone Call Additional follow up Details #2::    called and spoke with pt.  pt is aware of RB's recs and will have Dr. Jimmy Footman do a cxr and forward results to [place].  Jinny Blossom Reynolds LPN  January 24, [id number] 3:38 PM

## 2010-09-18 NOTE — Progress Notes (Signed)
Summary: STILL WHEEZING- NOT MUCH BETTER   Phone Note Call from Patient Call back at Home Phone [phone]   Caller: Patient Call For: [facility]RETT Summary of Call: PT WAS SEEN 07/30/10. SHE SAYS SHE IS STILL WHEEZING AND HAS BACK PAIN. SHE FINISHED [place]. NO FEVER. SHE HAS HAD SOME NAUSEA AND VOMITING. PT SAYS SHE STILL FEELS BAD.  ALSO WANTED TO KNOW IF THE INS CO HAD CONTACTED TP RE: AUTH FOR XOPENEX. [facility] [address] Initial call taken by: Cooper Render, CNA,  August 06, 2010 3:44 PM  Follow-up for Phone Call        Spoke with pt and she states she has seen some improvement in her symptoms but she is still having some chest tightness and is not able to get any of he congestion in her chest to "come out". Pt finished levaquin on 08-04-10. Pt aware that TP out of office this pM but I will send her a message in Am.pt ok with this. [place] Bing CMA  August 06, 2010 5:05 PM   Additional Follow-up for Phone Call Additional follow up Details #1::        Would increase prednisone 10mg   up to  4 tabs for 2 days, then 3 tabs for 2 days, 2 tabs for 2 days, then 1 tab for 2 days, then back to regular dose mucinex dm two times a day as needed congestion saline nasal rinse as needed  tylenol and rest as needed  has chronic vomitting s/p lap band ? is this worse  if not improving will need ov  Please contact office for sooner follow up if symptoms do not improve or worsen  Additional Follow-up by: Rexene Edison NP,  August 07, 2010 9:05 AM    Additional Follow-up for Phone Call Additional follow up Details #2::    Called, spoke with pt.  She is aware of above recs per TP and verbalized understanding.  States vomiting has gotten better.  She is aware pred 10mg  taper sent to [facility].  She will try above recs per TP.  WCB if sxs do not improve or worsen for OV.    Follow-up by: Raymondo Band RN,  August 07, 2010 9:36 AM  New/Updated  Medications: PREDNISONE 10 MG TABS (PREDNISONE) 4 tabs for 2 days, then 3 tabs for 2 days, 2 tabs for 2 days, then 1 tab for 2 days, then back to regular dose Prescriptions: PREDNISONE 10 MG TABS (PREDNISONE) 4 tabs for 2 days, then 3 tabs for 2 days, 2 tabs for 2 days, then 1 tab for 2 days, then back to regular dose  #20 x 0   Entered by:   Raymondo Band RN   Authorized by:   Rexene Edison NP   Signed by:   Raymondo Band RN on 08/07/2010   Method used:   Electronically to        [facility]. [id number]* (retail)       [address]       [place], [place]  [address]       Ph: [phone] or [phone]       Fax: [phone]   RxID:   [id number]

## 2010-09-19 LAB — VANCOMYCIN, TROUGH: Vancomycin Tr: 15.5 ug/mL (ref 10.0–20.0)

## 2010-09-21 LAB — CULTURE, BLOOD (ROUTINE X 2): Culture: NO GROWTH

## 2010-09-22 NOTE — Consult Note (Signed)
NAMEAMARAE, BILLMAN              ACCOUNT NO.:  [id number]  MEDICAL RECORD NO.:  [id number]           PATIENT TYPE:  I  LOCATION:  [id number]                         FACILITY:  [facility]  PHYSICIAN:  Katherine [name], M.D.    DATE OF BIRTH:  February 25, 1956  DATE OF CONSULTATION:  09/17/2010 DATE OF DISCHARGE:                                CONSULTATION   Thank you very much for asking [place] to see [name], a very pleasant 55 year old female with multiple medical problems including: 1. Coronary artery disease with history of myocardial infarction in     1995. 2. Asthma. 3. Hypertension. 4. 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 5. Hyperlipidemia. 6. Gastroesophageal reflux disease. 7. Paroxysmal atrial fibrillation. 8. Fibromyalgia. 9. Type 2 diabetes mellitus, under control with weight loss. 10.Obstructive sleep apnea.  PAST SURGICAL HISTORY: 1. She has had coronary artery bypass grafting. 2. Laparoscopic gastric band done in 2005. 3. Tubal ligation. 4. Renal transplant in 1991.  She is on the list of medications that include: 1. Prilosec. 2. Ativan. 3. Aspirin. 4. Zoloft. 5. Pravachol. 6. Prednisone. 7. Imuran. 8. Plavix. 9. Tramadol. 10.ProAir HFA. 11.Oxygen at bedtime. 12.Pulmicort. 13.Singulair. 14.Nasonex. 15.Nasal saline. 16.Cymbalta. 17.Flexeril.  She has intolerance or allergies to CODEINE, ERYTHROMYCIN, NUBAIN, SULFA, and DILAUDID.  I was asked to see the patient because of a dilated esophagus noted on the patient's recent CT scan done.  She has been in the hospital since September 15, 2010 with treatment of a right lower lobe pneumonia.  This was associated with a white count, which is improved on antibiotic therapy from 12.4-6.5.  I was asked to see the patient because of esophageal dilatation throughout the entire esophagus filled with fluid and food particles.  The patient reports a history of intermittent vomiting,  associated with her hospitalization and prior to this.  When she reports about her eating pattern, she says that at times, she can eat normally and other times she feels as though she can hardly get anything down and she vomits a lot.  Currently, she has had intermittent vomiting including earlier today.  Generally, when she throws up, it is undigested food as would be expected.  On her examination today, the patient has no abdominal pain.  Her port site is in the left upper quadrant of her abdomen.  She has an old previously infected port site in her right lower quadrant.  This is from about 2 years ago.  There is partially eaten plate of regular food at her bedside that the patient just finished consuming and apparently did not have any problems with nausea or vomiting.  I reviewed her CT scan, which shows a dilated esophagus down to the level of her gastric band with a small gastric pocket above it.  The ring appears to be in a proper position.  It may be that the banding adjustment or that she needs some fluid taken out of her band, but she is not in any current distress and this can be evaluated in the future by Dr. Excell Seltzer, one of the bariatric surgeons. She has failed  to make an appointment to come back to see Dr. Excell Seltzer from the last couple of years because of current health problems; however, we will go ahead and address this problem currently.  We will see if this is a problem or eating problem with the band that is too tight.  We may need to adjust the band by removing fluid in the future.     Katherine [name], M.D.     JOW/MEDQ  D:  09/17/2010  T:  09/18/2010  Job:  [id number]  cc:   [facility]  Electronically Signed by Judeth Horn M.D. on 09/22/2010 02:23:47 PM

## 2010-10-01 NOTE — Discharge Summary (Signed)
Katherine, [name]              ACCOUNT NO.:  [id number]  MEDICAL RECORD NO.:  [id number]           PATIENT TYPE:  I  LOCATION:  [id number]                         FACILITY:  [facility]  PHYSICIAN:  Estill Cotta, MD       DATE OF BIRTH:  [birth year]  DATE OF ADMISSION:  09/15/2010 DATE OF DISCHARGE:  09/20/2010                              DISCHARGE SUMMARY   PRIMARY CARE PHYSICIAN:  Dr. Jimmy Footman as well as renal.  DISCHARGE DIAGNOSIS: 1. Recurrent pneumonia on the right. 2. Hypokalemia. 3. Coronary artery disease. 4. Gastroesophageal reflux disease. 5. 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  CONSULTATIONS:  General surgery, [name] Kitchen T. Excell Seltzer, MD, and Gwenyth Ober, MD.  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 The patient states that she had intermittent fevers and had been treated with Levaquin and subsequently a 10-day course of Ceftin.  However, the patient's symptoms improved for a day or two, but then returned to doctor in 2-3 days on antibiotics.  The patient saw Dr. Jimmy Footman on the day of admission who performed another chest x-ray and confirmed there was another right mid lung pneumonia and the patient was sent to the hospital for further management.  RADIOLOGICAL DATA:  Chest x-ray two-view, new focal airspace disease in the right mid lung compatible with pneumonia.  CT chest without contrast on January 31 showed 1 mild cardiomegaly, mild patchy airspace opacities in the right upper lobe which are likely due to pneumonia.  No cardiopulmonary disease, otherwise too large amount of fluid and food in throughout the entire esophagus and this  patient with previous gastric band procedure.  This may be due to the patient is either overeating or the band may be too tight and causing obstruction, multinodular goiter, atrophic kidneys consistent with chronic renal failure.  BRIEF HOSPITALIZATION COURSE:  [name] is a 55 year old female who was admitted with recurrent pneumonia. 1. Recurrent pneumonia/community-acquired pneumonia.  The patient had     failed outpatient antibiotics.  Hence she was treated with     vancomycin and Zosyn IV as the healthcare-associated pneumonia.  CT     of the chest was obtained for further evaluation for recurrent     pneumonia and the patient was found to have tight gastric band     which was causing large amount of fluid and food throughout the     esophagus which could have led to the aspiration.  The patient will     continue on Avelox for 5 more days to complete the course of     antibiotics. 2. Obstruction.  Secondary to tight gastric band.  Gastroenterology     and General Surgery were consulted.  The patient was previously     seen by Dr. Hulen Skains and Dr. Excell Seltzer from surgery.  All the fluid was     removed  from the band by Dr. Excell Seltzer and esophageal dilatation.     Per Dr. Excell Seltzer, the patient will be followed up in the office in     2-3 weeks and will repeat a barium swallow at that time to confirm     the improvement.  Also the patient will have outpatient followup     with Gastroenterology for EGD and colonoscopy.  The patient will     continue proton pump inhibitor therapy as well. 3. Chronic obstructive pulmonary disease/bronchitis.  The patient     remained stable.  She was continued __________  antibiotics as well     as inhalers. 4. Chronic kidney disease, on immunosuppressants.  The patient was     initially also placed on hydrocortisone therapy due to hypotension     which has improved and she will continue prednisone and Imuran at     the home dose.  PHYSICAL EXAMINATION:   VITAL SIGNS:  At the time of discharge; temperature 98.3, pulse 54, respirations 18, blood pressure 129/74, O2 sats 98% on room air. GENERAL:  The patient is alert, awake and oriented x3 not in acute distress. HEENT:  Negative sclerae, conjunctivae.  Pupils are reactive to light and accommodation.  EOMI. NECK:  Supple.  No lymphopathy.  No JVD. CVS:  S1, S2 clear.  Regular rate and rhythm. CHEST:  Clear to auscultation bilaterally. ABDOMEN:  Soft, nontender, nondistended.  Normal bowel sounds. EXTREMITIES:  No cyanosis, clubbing or edema noted in upper or lower extremities.  DISCHARGE FOLLOWUP:  With Dr. Excell Seltzer within next 2-3 weeks and Dr. [name] in next 2-3 weeks as well.  DISCHARGE TIME:  35 minutes.     Estill Cotta, MD     RR/MEDQ  D:  09/20/2010  T:  09/20/2010  Job:  [id number]  cc:   Dr. Jimmy Footman T. [place]worth, M.D. [name], M.D.  Electronically Signed by Nira Conn Masoud Nyce  on 10/01/2010 03:09:11 PM

## 2010-10-16 NOTE — Consult Note (Signed)
NAMELYNDSE, COWANS              ACCOUNT NO.:  [id number]  MEDICAL RECORD NO.:  [id number]           PATIENT TYPE:  LOCATION:                                 FACILITY:  PHYSICIAN:  [name], M.D.   DATE OF BIRTH:  [birth year]  DATE OF CONSULTATION:  09/18/2010 DATE OF DISCHARGE:                                CONSULTATION   Dr. Algis Liming of the [facility] asked me to see this pleasant 55- year-old female because of a dilated, fluid-filled esophagus seen on chest CT.  The patient has been known to me for a number of years, including evaluation for previous anemia and also a family history of colon cancer.  She was admitted to the hospital several days ago because of right upper lobe pneumonia which did not respond to a couple of trials of outpatient therapy.  Chest CT obtained on admission showed not only pneumonia, but also a dilated, fluid-filled esophagus.  Of relevance, the patient is status post a lap band procedure approximately 6 years ago, in September 2005, that worked well for the patient and she has lost about 120 pounds.  However, the lap band has been progressively tightened and over the time since the lap band was performed, she has noticed intermittent problems with nocturnal regurgitation and choking, as well as postprandial regurgitation and occasional frank vomiting.  She also finds that she gets a burning in her throat after eating certain medications, but she does not really have chronic retrosternal heartburn type symptoms, on a daily dose of Prilosec.  She does not have dysphagia symptoms per se.  Interestingly, her food tolerance seems to be variable.  One day she can eat a whole plate of pancakes and get away with it, other times she can eat smaller amounts and will get postprandial regurgitation or vomiting.  There is also a family history of colon cancer in her father at age 77. The patient has had two previous colonoscopies, in 2004  and September 2007, without any adenomas being found.  She did have a grossly normal video capsule endoscopy in 2007.  She was last seen by me in the office in 2008.  The patient has multiple medication allergies including CODEINE, ERYTHROMYCIN, NUBAIN, SULFA, and DILAUDID.  Outpatient medications prior to admission were: 1. Prilosec 20 mg daily. 2. Ativan 1 mg twice daily. 3. Aspirin 81 mg daily. 4. Zoloft 200 mg at bedtime. 5. Pravachol 80 mg daily. 6. Prednisone 5 mg daily. 7. Imuran 125 mg daily. 8. Plavix 75 mg daily. 9. Tramadol p.r.n. 10.ProAir oxygen at that time. 11.Pulmicort p.r.n. 12.Singulair. 13.Nasonex. 14.Cymbalta. 15.Flexeril.  Operations include coronary artery bypass grafting in 1995 with subsequent multiple stent placements, kidney transplant in 1991, and the above-mentioned lap band procedure in 2007.  Some of the patient's weight gain necessitating the lap band is attributed to her chronic steroid therapy for immunosuppression status post renal transplant.  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  HABITS:  Previous smoker, nondrinker.  FAMILY HISTORY:  Cirrhosis in her mother.  Colon cancer in her father at age 63.  SOCIAL HISTORY:  Married, disabled, lives with her husband.  REVIEW OF SYSTEMS:  See HPI.  PHYSICAL EXAMINATION:  GENERAL:  A  pleasant, somewhat overweight Caucasian female in absolutely no acute distress.  She is not coughing nor does she appear short of breath.  She is anicteric and without pallor. CHEST:  At this time, the chest is clear to auscultation, without rales or wheezes. HEART:  Normal, regular rhythm, no murmurs. ABDOMEN:  Somewhat adipose, soft, nontender. NEUROLOGIC:  Mental status appropriate, no obvious cranial nerve or focal motor deficits.  LABORATORY DATA:  White count on admission was 12,400, currently 9800. Hemoglobin following hydration is 11.5.  Kidney function normal with BUN 13 and creatinine of 0.8 on transplanted kidney.  Liver chemistries normal on admission.  Albumin prior to hydration was 3.3.  CK has been normal.  CT of chest, see above discussion.  IMPRESSION:  I think that the patient's fluid-filled esophagus and esophageal dilatation are probably secondary to impaired proximal gastric emptying secondary to her lap band, I doubt her primary esophageal disorder such as achalasia or esophageal stricturing.  It is unclear whether the patient's presumed chronic reflux and regurgitation have led to esophageal mucosal injury, or not.  She is up-to-date, but almost due, for colon cancer screening in view of her family history of colon cancer.  RECOMMENDATIONS: 1. Agree with the surgeon's plan to loosen the lap band. 2. I would obtain an upper GI series a day or two after loosening the     lap band to define the anatomy and assure or confirm adequate     esophageal emptying. 3. The patient will eventually need outpatient endoscopic evaluation     under propofol sedation in view of her multiple psychoactive     medications, so as to look for any esophageal mucosal injury or     inflammation, and thereby help guide medical therapy. 4. For now, we would continue standard dose PPI therapy, but if the     endoscopy shows esophagitis, high-dose PPI therapy may be required. 5.  Concurrently with her outpatient endoscopy, I would favor a     screening colonoscopy since she is almost due for that examination.  I appreciate the opportunity to have seen this patient in consultation with you.          ______________________________ [name], M.D.     RB/MEDQ  D:  09/18/2010  T:  09/18/2010  Job:  [id number]  cc:   Jeneen Rinks L. Deterding, M.D. [name]. Hoxworth, M.D.  Electronically Signed by [name] M.D. on 10/16/2010 07:56:29 AM

## 2010-11-22 LAB — WOUND CULTURE

## 2010-11-25 LAB — POCT I-STAT, CHEM 8
Calcium, Ion: 1.08 mmol/L — ABNORMAL LOW (ref 1.12–1.32)
Chloride: 108 mEq/L (ref 96–112)
Creatinine, Ser: 0.8 mg/dL (ref 0.4–1.2)
Glucose, Bld: 120 mg/dL — ABNORMAL HIGH (ref 70–99)
Hemoglobin: 15 g/dL (ref 12.0–15.0)
Potassium: 4.1 mEq/L (ref 3.5–5.1)

## 2010-11-25 LAB — CBC
HCT: 39.6 % (ref 36.0–46.0)
Hemoglobin: 13.1 g/dL (ref 12.0–15.0)
MCV: 93.8 fL (ref 78.0–100.0)
RBC: 4.22 MIL/uL (ref 3.87–5.11)
WBC: 10.4 10*3/uL (ref 4.0–10.5)

## 2010-11-25 LAB — DIFFERENTIAL
Eosinophils Absolute: 0.1 10*3/uL (ref 0.0–0.7)
Lymphs Abs: 1.9 10*3/uL (ref 0.7–4.0)
Monocytes Absolute: 1 10*3/uL (ref 0.1–1.0)
Monocytes Relative: 9 % (ref 3–12)
Neutrophils Relative %: 71 % (ref 43–77)

## 2010-11-25 LAB — APTT: aPTT: 37 seconds (ref 24–37)

## 2010-12-02 LAB — GLUCOSE, CAPILLARY: Glucose-Capillary: 99 mg/dL (ref 70–99)

## 2010-12-30 NOTE — Assessment & Plan Note (Signed)
[place]                             PULMONARY OFFICE NOTE   OLIVIAH, MCNEW                     MRN:          [id number]  DATE:06/17/2007                            DOB:          [birth year]    SUBJECTIVE:  Ms. Kouba is a 55 year old woman who I follow for  obstructive sleep apnea.  She also has a history of a renal transplant  and lap band procedure with significant associated weight loss.  She is  on chronic prednisone for immunosuppression as well as Imuran.  She has  a history of chronic cough as well.  She tells me that since our last  visit, she has been doing fairly well, has been compliant with CPAP.  We  made plans for her to have a repeat polysomnogram to confirm whether she  actually still has obstructive sleep apnea now that she has lost weight.  This has not yet been completed.  She returns today telling me that she  has had upper respiratory symptoms for the last 2-3 days with rhinorrhea  and some sore throat.  She has also developed bilateral ear fullness and  pain, more so on the right than on the left with some subjective fevers  and chills.  She denies any significant cough.   MEDICATIONS:  These are correct, as indicated on her clinic chart dated  June 17, 2007.   PHYSICAL EXAMINATION:  GENERAL:  This is a pleasant well-appearing woman  in no distress.  Her weight is 142 pounds.  Temperature is 97.9, blood pressure 104/72,  heart rate 47, SpO2 96% on room air.  HEENT:  Her posterior pharynx has some erythema with some small pustules  and exudate.  Her bilateral ear exam shows bilateral fullness behind the  tympanic membranes with an exudative fluid consistent with otitis.  This  is more significant on the right than on the left.  LUNGS:  Clear to auscultation bilaterally.  HEART:  Regular without a murmur.  Borderline bradycardic.  ABDOMEN:  Benign.  EXTREMITIES:  Benign.   IMPRESSION:  1. Bilateral otitis media  in the setting of an upper respiratory      infection and possible strep pharyngitis.  2. Obstructive sleep apnea.  3. History of renal transplant on immunosuppression.  4. Chronic cough with gastroesophageal reflux disease.   PLAN:  1. I will treat her with amoxicillin at high dose for seven days for      her otitis media and also for her strep pharyngitis. \  2. She will have a sleep study to confirm whether she still needs      CPAP.  If she no longer has sleep apnea, then we will discontinue      her mask.  3. I will follow up with Ms. Lewison in three months or sooner if she      has any difficulty in the interim.     Collene Gobble, MD  Electronically Signed    RSB/MedQ  DD: 06/17/2007  DT: 06/18/2007  Job #: [id number]   cc:   Jeneen Rinks L. Deterding, M.D.  [name]. Hoxworth, M.D.

## 2010-12-30 NOTE — Discharge Summary (Signed)
NAMETELIA, LOURO              ACCOUNT NO.:  [id number]   MEDICAL RECORD NO.:  [id number]          PATIENT TYPE:  INP   LOCATION:  5002                         FACILITY:  [facility]   PHYSICIAN:  [name] Kitchen T. Hoxworth, M.D.DATE OF BIRTH:  05-24-1956   DATE OF ADMISSION:  06/04/2008  DATE OF DISCHARGE:  06/07/2008                               DISCHARGE SUMMARY   DISCHARGE DIAGNOSIS:  Infected lap band port.   SURGICAL PROCEDURE:  Removal of infected lap band port on June 06, 2008.   HISTORY OF PRESENT ILLNESS:  Katherine [name] is a 55 year old female with  a history of morbid obesity and multiple medical problems, status post  lap band placement 2-1/2 years ago with excellent weight loss and  improvement in multiple comorbidities.  She developed a slow leak from  her lap band port and underwent port replacement 10 days prior to this  admission.  This was minimal surgery in the abdominal wall to remove her  port without any bleeding at that time.  The patient, however,  subsequently developed a large wound hematoma that spontaneously drained  a week ago.  She has been treated with local wound care at home, but  yesterday had increasing pain, swelling, chills, nausea, and vomiting,  and was brought to [facility] Emergency Room by the EMS with fever at  102 and decreased blood pressures low at 60 systolic.  She has not had  any unusual purulent drainage.  No generalized abdominal pain.   PAST MEDICAL HISTORY:  Other surgery includes coronary artery bypass  grafting in 1995 and cadaveric renal transplant in 1991.  Medically, she  has been followed for coronary artery disease, COPD, history of morbid  obesity, history of tobacco abuse, anemia, history of obstructive sleep  apnea improved with weight loss, diabetes improved with weight loss,  chronic anxiety, fibromyalgia, and history of atrial fibrillation.   MEDICATIONS ON ADMISSION:  1. Prilosec 20 daily.  2. Lorazepam 1 b.i.d.  3. Aspirin 81 daily.  4. Plavix 75 daily.  5. Imuran 100 daily.  6. Prednisone 5 daily.  7. Pravachol 80 daily.  8. Zoloft 100 daily.  9. Darvocet-N 100 p.r.n.  10.Ventolin p.r.n.  11.Pulmicort p.r.n.  12.Singulair daily.  13.Imdur 60 daily.  14.Oxygen p.r.n.   ALLERGIES:  CODEINE, ERYTHROMYCIN, NUBAIN, and SULFA.   PHYSICAL EXAMINATION:  VITAL SIGNS:  Blood pressure initially 60s, soon  after fluid 106/75, temperature was 98 on admission.  GENERAL:  Somewhat chronic ill-appearing female in no acute distress.  SKIN:  Warm and dry.  LUNGS:  Clear without wheeze or increased work of breathing.  CARDIAC:  Regular rate and rhythm without murmurs.  ABDOMEN:  Generally soft and nontender.  There is ecchymosis, swelling,  and some tenderness around her wound which is open about a centimeter  with some slight bloody drainage, but no obvious purulent drainage.   HOSPITAL COURSE:  The patient was admitted with an impression of  possible infection of her port due to the wound hematoma and disruption.  She was seen in consultation by Critical Care Medicine due to her  hypotension  and there was some feeling that she might be in septic  shock.  However, her blood pressure improved quickly with IV fluids and  she did not have any sequelae of septic shock and I suspect this was  more on the basis of fever and hypovolemia rather than true septic  shock.  She was started on broad-spectrum antibiotics.  Her wound was  cultured.  The following day, this was growing Staph aureus.  Remainder  for her cultures were negative.  Chest x-ray was negative.  Her vital  signs were stable.  She was on Zosyn and vancomycin.  Due to the  positive cultures, I felt that it was best to proceed with removal of  her port and this was accomplished on June 06, 2008, under general  anesthesia.  There was no gross purulence, but there was cloudy old  blood around the port.  The port was removed.  The tubing was left  in  the abdomen and the wound was packed open.  She immediately felt much  better.  By June 07, 2008, she was afebrile, felt only sore.  The  wound packing was changed and the wound was very clean.  White count was  6000.  At this time, she is discharged home on oral Bactrim for 10 days  with wound care.  Otherwise, medications are the same as admission.  Followup should be in my office in 1 week.      [name]. Hoxworth, M.D.  Electronically Signed     BTH/MEDQ  D:  08/20/2008  T:  08/21/2008  Job:  [id number]

## 2010-12-30 NOTE — Assessment & Plan Note (Signed)
[place]                             PULMONARY OFFICE NOTE   Katherine [name], Katherine [name]                     MRN:          [id number]  DATE:04/29/2007                            DOB:          Feb 18, 1956    Katherine [name] is a 55 year old woman with a history of renal  transplantation, obesity, status post lap band procedure with a  significant associated weight loss, gastroesophageal reflux disease, and  obstructive sleep apnea.  She has also been treated with bronchodilators  for a suspected history of airflow limitation.  She returns today for a  regularly scheduled followup visit.  She tells me that for the last week  she has experienced upper respiratory type symptoms with body aches and  clear nasal drainage.  She believes that she caught a cold from a recent  sick contact.  She has been feeling bad and has little energy.  She  continues to have the same degree of heartburn and gastroesophageal  reflux disease symptoms, and continues to have nocturnal cough.  Pulmonary function testing has been performed since our last visit, and  she is here to review the results of these today.   CURRENT MEDICINES:  1. Prilosec 20 mg daily.  2. Ativan 1 mg b.i.d.  3. Aspirin 81 mg daily.  4. Zoloft 150 mg daily.  5. Tricor 145 mg daily.  6. Pravachol 80 mg daily.  7. Pulmicort nebulized with Atrovent b.i.d.  8. Prednisone 5 mg daily.  9. Imuran 12.5 mg daily.  10.Plavix 75 mg daily.  11.Ensure dietary supplements daily.  12.Darvocet p.r.n.  13.Ventolin 2 puffs q.4 h. p.r.n. for shortness of breath.  14.Rhinocort p.r.n.   PHYSICAL EXAMINATION:  IN GENERAL:  This is a comfortable, overweight  woman who is in no distress.  Her weight is 140 pounds, which is down 5 pounds from our last visit.  Her temperature is 97.7, blood pressure 110/66, heart rate 61, SPO2 96%  on room air.  HEENT EXAM:  Her oropharynx is clear.  Her neck supple without lymphadenopathy or  stridor.  Her lungs are clear without any wheezing or crackles.  HEART:  Borderline bradycardic, regular without a murmur.  ABDOMEN:  Soft, obese, nontender, positive bowel sounds.  EXTREMITIES:  Have no cyanosis, clubbing, or edema.  NEUROLOGICALLY:  She has a nonfocal exam.   Pulmonary function testing performed today shows normal airflow without  a bronchodilator response.  Her lung volumes were normal and her  diffusion capacity was decreased at 61% of predicted, but corrected into  the normal range when it was adjusted for her alveolar volume.   IMPRESSION:  1. Obesity with a history of obstructive sleep apnea and now with      significant weight loss.  It may be possible to discontinue her      continuous positive airway pressure if her obstructive sleep apnea      has resolved with her weight loss.  2. Chronic cough in the setting of gastroesophageal reflux disease,      particularly bothersome at night.  3. History of possible airflow limitation, possibly  exacerbated by her      gastroesophageal reflux disease.  Her pulmonary function testing      today does not support airflow limitation.   PLAN:  1. I have asked her to boost the head of her bed to bring up her      sleeping angle and hopefully decrease nocturnal gastroesophageal      reflux disease.  She will follow up with Dr. Excell Seltzer regarding      reflux issues and aspiration precautions.  2. I will order a repeat polysomnogram to see if her obstructive sleep      apnea has resolved.  If it has, then we will be able to discontinue      her continuous positive airway pressure.  3. I will stop her Pulmicort and Atrovent nebulizers and see if she      tolerates this after her upper respiratory infection has resolved.  4. I will follow up with Katherine [name] in 6 weeks to review her      symptoms.     Collene Gobble, MD  Electronically Signed    RSB/MedQ  DD: 05/26/2007  DT: 05/26/2007  Job #: [id number]   cc:    [name]. Hoxworth, M.D.  James L. Deterding, M.D.

## 2010-12-30 NOTE — Op Note (Signed)
Katherine [name], Katherine [name]              ACCOUNT NO.:  [id number]   MEDICAL RECORD NO.:  [id number]          PATIENT TYPE:  AMB   LOCATION:  DAY                          FACILITY[facility]   PHYSICIAN:  [name] Kitchen T. Hoxworth, M.D.DATE OF BIRTH:  08/23/1955   DATE OF PROCEDURE:  09/17/2008  DATE OF DISCHARGE:                               OPERATIVE REPORT   PREOPERATIVE DIAGNOSIS:  Status post removal of lap band port for  infection.   POSTOPERATIVE DIAGNOSIS:  Status post removal of lap band port for  infection.   SURGICAL PROCEDURES:  Laparoscopy and replacement of lap band port.   SURGEON:  Dr. Excell Seltzer   ANESTHESIA:  General.   BRIEF HISTORY:  [name] is a 55 year old female, status post lap  band placement approximately 3 years ago with excellent weight loss.  She, however, developed a leaking lap band port which required  replacement and at that time she developed a wound hematoma and  subsequent infection around the port which was removed a number of  months ago.  The tubing was dropped into the abdominal cavity.  This  wound has now healed completely and she is brought back to the operating  room for replacement of her port.  The nature of the procedure,  indications, risks of bleeding, infection and intestinal injury were  discussed and understood.   DESCRIPTION OF OPERATION:  The patient was brought to the operating  room, placed in the supine position on the operating table and general  endotracheal anesthesia was induced.  She received preoperative IV  antibiotics.  PAS were in place.  The abdomen was widely sterilely  prepped and draped.  Access was obtained in the left upper quadrant  midclavicular line with a 5 mm Optiview trocar without difficulty and  pneumoperitoneum established.  The abdominal viscera appeared normal and  there were no adhesions.  The tubing was not immediately apparent.  I  placed a second 5-mm port in the right upper quadrant through one of  the  previous incisions and the left lobe of liver was elevated and the  tubing was curled in the left upper quadrant.  The tubing was brought  out the end, located and then grasped through the left upper quadrant 5-  mm site and exteriorized.  The trocars were removed all and CO2  evacuated.  This incision was lengthened slightly, and I chose to place  the port in the subcutaneous position.  A subcutaneous pocket was  created.  A 1/2-inch square of polypropylene mesh was sutured to the  base of a new port which was then attached to the tubing.  This was then  flushed and aspirated repeatedly and all air evacuated.  Following this,  about 1.5 mL of fluid was introduced.  The port was then positioned  subcutaneously laterally to the incision, oriented correctly and the  tubing was seen to curve smoothly into the abdomen.  There was no  bleeding.  The subcu at this site was closed with a running 2-0 Vicryl and skin  incisions were closed subcuticular Monocryl and Dermabond.  Sponge,  needle and  instrument counts were correct.  The patient was taken to  recovery in good condition.      [name]. Hoxworth, M.D.  Electronically Signed     BTH/MEDQ  D:  09/17/2008  T:  09/17/2008  Job:  [id number]

## 2010-12-30 NOTE — Op Note (Signed)
Katherine [name], Katherine [name]              ACCOUNT NO.:  [id number]   MEDICAL RECORD NO.:  [id number]          PATIENT TYPE:  INP   LOCATION:  5002                         FACILITY:  [place]   PHYSICIAN:  [name] Kitchen T. Hoxworth, M.D.DATE OF BIRTH:  03-Sep-1955   DATE OF PROCEDURE:  06/06/2008  DATE OF DISCHARGE:                               OPERATIVE REPORT   PREOPERATIVE DIAGNOSIS:  Infected lap band port.   POSTOPERATIVE DIAGNOSIS:  Infected lap band port.   SURGICAL PROCEDURES:  Removal of infected lap band port.   SURGEON:  [name]. Hoxworth, MD   ANESTHESIA:  General.   BRIEF HISTORY:  Katherine [name] is a 55 year old female status post lap band  placement in 2006 with excellent weight loss.  She however developed a  leak in her lap band port, which was replaced about 2 weeks ago.  She  unfortunately 5 days postoperatively developed a large hematoma at the  wound site with an open wound and then subsequently has been admitted  with signs of infection and has staph aureus cultured from her wound.  I  have recommended removal of the port to clear the infection.  The nature  of procedure, indications, and risks were discussed with the patient.  She is now brought to the operating room for the procedure.   DESCRIPTION OF OPERATION:  The patient was brought to the operating room  and placed in supine position on the operating room table and general  anesthesia was induced.  The abdomen was widely sterilely prepped and  draped.  She was already on broad-spectrum antibiotics with vancomycin  and subcutaneous heparin.  Correct patient and procedure were verified.  The port site was already opened and this wound was suctioned and  irrigated and retractors were used to expose the port.  The Prolene  sutures were all removed completely and the port brought out and the  tubing cut beyond the connector.  The port removed.  The remaining  tubing was placed back into the peritoneal cavity.  The wound  was  thoroughly irrigated and a little bit of fibrinous debris debrided and  the wound was packed with moist saline gauze.  There was no bleeding.  Sponges and needle counts were correct.  Dressing was applied.  She was  taken to recovery in good condition.      [name]. Hoxworth, M.D.  Electronically Signed     BTH/MEDQ  D:  06/06/2008  T:  06/07/2008  Job:  [id number]

## 2010-12-30 NOTE — Assessment & Plan Note (Signed)
[facility]                             PULMONARY OFFICE NOTE   NAME:Katherine [name], Katherine [name]                     MRN:          [id number]  DATE:03/15/2007                            DOB:          [birth year]    PULMONARY FOLLOW-UP VISIT:   SUBJECTIVE:  Ms. Buckalew is a 55 year old woman with a history of renal  transplantation, obesity with obstructive sleep apnea, former tobacco  abuse, history of lap band procedure with associated weight loss, and  gastroesophageal reflux.  She may also have a history of asthma and has  been treated with standing bronchodilators and inhaled corticosteroids.  She has been followed in our office by Dr. [name].  She returns  today complaining of left-sided chest discomfort.  She tells me that  this began to bother her several weeks ago and appears to be associated  with some increase in her reflux symptoms.  She has the pain when she  lies down supine.  It wakes her from sleep.  It does not occur when she  is awake.  It does not occur with exercise.  She does not have any  significant musculoskeletal pain.  It feels like it is deep inside and  is a burning pain.  As mentioned, she had a lap band procedure three  years ago.  She tells me that she eats larger boluses than originally  recommended, and that she has purges about 3-4 times a day and has done  so for about one year.   Her weight loss has been approximately 145 pounds.  She stopped her  CPAP.  She still snores.  She has not had any witnessed apnea.  She does  not have any daytime sleepiness or other daytime symptoms.  She has not  smoked for over three years.  She has an appointment to see Dr. Excell Seltzer  with regards to her lap band procedure in August of this year.   CURRENT MEDICATIONS:  1. Prilosec 20 mg daily.  2. Ativan 1 mg b.i.d.  3. Aspirin 81 mg daily.  4. Zoloft 150 mg daily.  5. Tricor 145 mg daily.  6. Pravachol 80 mg daily.  7.  Pulmicort/Atrovent nebulized b.i.d.  8. Prednisone 5 mg daily.  9. Imuran 12.5 mg daily.  10.Plavix 75 mg daily.  11.Ensure once daily.  12.Potassium chloride 20 mEq daily.  13.Darvocet 2 p.o. b.i.d. p.r.n.  14.Ventolin 2 puffs q.4h. p.r.n.  15.Rhinocort p.r.n.  16.Amoxicillin b.i.d., which was started yesterday for urinary tract      infection.   PHYSICAL EXAMINATION:  GENERAL:  This is a somewhat comfortable woman  who is in no distress on room air.  Her weight is 145 pounds.  Temperature 97.5, blood pressure 104/66,  heart rate 55, SpO2 99% on room air.  HEENT:  She has a bruise on the left side of her chin.  Her oropharynx  is clear without obvious lesion.  NECK:  Supple without lymphadenopathy or stridor.  LUNGS:  Completely clear without wheezing or crackles.  HEART:  Borderline bradycardic but regular without murmur.  ABDOMEN:  Soft and  nontender with positive bowel sounds.  EXTREMITIES:  No clubbing, cyanosis or edema.  NEUROLOGIC:  She interacts appropriately and has a nonfocal exam.   IMPRESSION:  1. Chest pain:  I suspect that she is experiencing aspiration of      reflux of gastric contents while she lies supine.  This appears to      be associated with chest discomfort and cough.  She probably is      experiencing the esophageal and large airway irritation.  I am      concerned that there is a contribution of her purging and her      inappropriate meal bolus size to this phenomenon.  I imagine that      this is making her reflux more difficult to manage.  2. Asthma versus gastroesophageal reflux disease-related upper airway      irritation and possible air flow limitation.  3. History of obstructive sleep apnea, now with significant weight      loss.  I question whether she still has obstructive sleep apnea.      Repeat polysomnogram has not been performed.   PLAN:  1. I have asked her to decrease the size of her meal boluses and to      attempt to avoid purging  if all possible.  She will continue her      proton pump inhibitor as ordered.  This may need to be increased at      some point in the future.  She has a followup arranged with Dr.      Excell Seltzer next month to review strategies to help with her GERD in      the setting of her lap band procedure.  2. Full pulmonary function testing after she has made the      modifications above in order to rule in or out true air flow      limitation.  3. I will consider repeating a polysomnogram to confirm that she no      longer has obstructive sleep apnea.  If she does have apneas, then      she probably needs to remain on CPAP.  4. I will follow up with Ms. Boman in six weeks or sooner should she      have any difficulty in the interim.     Collene Gobble, MD  Electronically Signed    RSB/MedQ  DD: 03/15/2007  DT: 03/16/2007  Job #: [id number]   cc:   [name]. Hoxworth, M.D.  James L. Deterding, M.D.

## 2010-12-30 NOTE — Assessment & Plan Note (Signed)
 [facility]                             PULMONARY OFFICE NOTE   NAME:Katherine [name], Katherine [name]                     MRN:          [id number]  DATE:01/25/2007                            DOB:          [birth year]    HISTORY OF PRESENT ILLNESS:  The patient is a 55 year old white female,  patient of Dr. Domingo Dimes with a known history of asthmatic bronchitis,  obstructive sleep apnea and chronic sinusitis presents today complaining  of a 1 week history of nasal congestion, dry cough and wheezing.  The  patient denies any purulent sputum, fever, chest pain, orthopnea, PND or  leg swelling.  The patient does complain of intermittent reflux symptoms  despite being on Prilosec daily.   PAST MEDICAL HISTORY:  Reviewed.   CURRENT MEDICATIONS:  Reviewed.   PHYSICAL EXAMINATION:  The patient is a pleasant a female in no acute  distress.  She is afebrile with stable vital signs.  O2 saturation is 96% on room  air.  HEENT:  Nasal mucosa is pale.  Nontender sinuses.  Posterior pharynx is  clear.  Neck is supple without adenopathy.  No JVD.  LUNGS:  Does reveal some expiratory wheezes,  also with some upper  airway pseudo wheezing on forced expiration.  CARDIAC:  Regular rate.  Abdomen is soft and nontender.  Extremities were warm without any edema.   IMPRESSION AND PLAN:  An acute asthmatic exacerbation with some upper  airway instability, possibly secondary to gastroesophageal reflux.  Patient has previously had lap band surgery.  Have suggested that she  begin a short prednisone taper. We want to avoid steroids as much as  possible in this patient.  The patient's chest x-ray was pending at the  time of dictation.  No antibiotics at this time.  We will increase  Prilosec up to 20 mg twice daily along with reflux preventative  measures.  Patient will recheck here in 2 weeks with Dr. Gwenette Greet or  sooner if needed.      Rexene Edison, NP  Electronically  Signed      Renold Don, MD  Electronically Signed   TP/MedQ  DD: 01/26/2007  DT: 01/26/2007  Job #: [id number]

## 2010-12-30 NOTE — Consult Note (Signed)
[name], [name] NO.:  [id number]   MEDICAL RECORD NO.:  [id number]          PATIENT TYPE:  EMS   LOCATION:  MAJO                         FACILITY:  [facility]   PHYSICIAN:  Adin Hector, MD     DATE OF BIRTH:  12-22-1955   DATE OF CONSULTATION:  DATE OF DISCHARGE:                                 CONSULTATION   PRIMARY CARE PHYSICIAN:  Dr. Mauricia Area, functions as her primary  care physician.   PULMONOLOGIST:  Collene Gobble, M.D.   BARIATRIC SURGEON:  [name]. Hoxworth, M.D.   REASON FOR CONSULT:  Nausea, vomiting, abdominal pain, status post lap  banding and recent port exchange.   HISTORY OF PRESENT ILLNESS:  Katherine [name] is a 55 year old female with  numerous health issues who is status post laparoscopic adjustable  gastric banding by Dr. Excell Seltzer back in 2005.  She had good  weight loss with this; however, she had loss of restriction over this  past year with evidence of leak by 3 attempted fills.  She was taken to  the operating room on May 23, 2008, and had her port removed and  replaced.  This was complicated by postoperative hematoma that required  the incision to be opened up, and she has been having the area packed.  The patient notes that she still has a lot of drainage coming out of  that area and she has to change it every 3 hours.  She has not had any  antibiotics at this time.   She was doing relatively well until about 16 hours ago, she starts  developing worsening upper abdominal pain and nausea and vomiting.  She  denies any fever, chills, or sweats, but she feels worse.  Because of  concerns, she came to the [facility] Emergency Department for  evaluation.  She denies any sick contacts or travel history although she  is wearing a mask because she is worried about getting infected.  She  has a history of kidney transplant.  She denies any hematemesis.  She  does suffer from constipation, but has not been severe.   She does not  feel like her restriction has changed markedly, and she has been trying  to avoid heavy eating or drinking.   PAST MEDICAL HISTORY:  1. Renal failure, status post cadaveric renal transplant with a      baseline creatinine of around 1.  2. History of acute on chronic bronchitis in the past.  3. Type 2 diabetes mellitus.  4. Anemia of chronic disease.  5. Coronary disease, status post coronary artery bypass grafting.  6. Obstructive sleep apnea, question probably improved with weight      loss.  7. Morbid obesity, status post laparoscopic adjustable gastric banding      in 2005.  8. Chronic anxiety.  9. Depression.  10.Question of goiter.  11.Gout.  12.Fibromyalgia.  13.Severe tobacco abuse in the past, nonsmoker now.  14.History of atrial fibrillation in the past.  15.Echocardiogram in 2006 shows left ventricular ejection fraction      between 55%-65% with normal function with  some aortic valve      thickness and moderate posterior papillary muscle head      calcification and mild left atrial dilatation.   PAST SURGICAL HISTORY:  1. Laparoscopic adjustable gastric banding by Dr. Excell Seltzer on      April 29, 2004.  2. Removal and replacement of laparoscopic band port on abdominal      wall.  3. Coronary artery bypass grafting in 1995.  4. Cadaveric renal transplant in 1991.  5. Status post mouth surgery with upper and lower dentures.   ALLERGIES:  Include to CODEINE, ERYTHROMYCIN, NUBAIN, and SULFA.   MEDICATIONS:  1. Prilosec 20 mg daily.  2. Lorazepam 1 mg b.i.d.  3. Aspirin 81 mg daily.  4. Plavix 75 mg daily.  5. Imuran 100 mg daily.  6. Prednisone 5 mg daily.  7. Pravachol 80 mg daily.  8. Zoloft 100 mg daily.  9. Darvocet-N 100 b.i.d. p.r.n. pain.  10.Ventolin p.r.n.  11.Pulmicort p.r.n.  12.Singulair daily.  13.Imdur 60 mg daily.  14.Oxygen 2 L p.r.n.   SOCIAL HISTORY:  She has probably about a 30-pack year history of  tobacco, but  is a nonsmoker with no alcohol or drug use.  She is here  today with her husband.   FAMILY HISTORY:  Negative for any major GI disorders such as irritable  bowel syndrome.  Mother had cirrhosis at age of 22 and hypertension does  run in the family.   REVIEW OF SYSTEMS:  As per HPI.  GENERAL:  She has had some chills, but  no definite fevers or sweats.  Her weight has been relatively stable.  EYES, ENT:  Negative.  CARDIAC:  Mild dyspnea on exertion but not too  severe.  RESPIRATORY:  No recent cold, coughs, or flu, no productive  sputum.  ABDOMEN:  As noted above, some mild appropriate dysphagia.  No  heartburn or reflux.  Otherwise per HPI.  GU:  Her urine has been stable  with no recent infection or UTIs that she can recall.  Negative  neurological or musculoskeletal.  PSYCH:  She has had her depression,  anxiety seem to be pretty well controlled.  No recent major problems.  HEME/LYMPH:  She is on chronic immunosuppression, but otherwise  unchanged.  Allergic as noted above in allergies.  Dermatologic is  otherwise negative.   PHYSICAL EXAMINATION:  VITAL SIGNS:  Her temperature is 98.1, blood  pressure is 112/58, pulse 75, respirations 18.  Pain is 7/10.  She is  95%-100% on room air.  GENERAL:  She is a well-developed, well-nourished, female lying in bed,  anxious, but consolable, uncomfortable but not frankly toxic.  PSYCH:  Mild anxiety, but is ultimately consolable.  No strong evidence  of any dementia, delirium, psychosis, or paranoia.  EYES:  Pupils equal, round and reactive to light.  Extraocular movements  were intact.  Sclera nonicteric or injected.  NECK:  Supple without any masses.  Trachea is midline.  HEENT:  She is normocephalic.  Mucous membranes are dry, but nasopharynx  and oropharynx are clear.  CHEST:  Clear to auscultation bilaterally.  No wheezes, rales or  rhonchi.  No pain to rib or sternal compression.  HEART:  Seems regular rhythm to me, no definite  murmurs, clicks, or  rubs.  ABDOMEN:  Obese, but soft.  She does not have any focal peritonitis.  In  her right mid abdomen, she has a horizontal wound with some mostly good  granulation tissue.  The  area was sterilely prepped, and I went ahead  and sterilely probed all the way down to the band port.  I did not  release any pus or purulence with that.  I went ahead and packed it  sterilely with gauze down to the base around the port.  There was no  release of any large hematoma or liquid either.  Around the area, she  does have probably 15 x 15 cm patch of ecchymosis.  She also has some  erythema lateral to that with some chronic skin breakdown consistent.  The patient tells me she has poor tolerance to tape, adhesives, and  things that is related to that.  She notes that has not changed at all.  It does seem to have sort of a square shaped pattern more consistent  with tape contact allergy than a true cellulitis.  GENITALIA:  Normal external female genitalia.  No obvious inguinal  hernias.  LYMPH:  No head, neck, axillary, or groin lymphadenopathy.  MUSCULOSKELETAL:  She has pretty good range of motion in her shoulders,  elbows, wrists, as well as hips, knees, and ankles.  RECTAL:  Refused.  EXTREMITIES:  Some mild edema, some mild clubbing, but no definite  cyanosis.  BREAST:  Deferred.   LABORATORY VALUES:  Her white count is 10.2, hemoglobin 13.3.  Her  potassium is 3.4.  Her creatinine is 1.1.  She has a 3-way x-ray which  shows some mild chronic interstitial lung changes on lungs which are not  changed.  Abdomen, she does have a fair amount of colonic stool, but no  definite air-fluid levels.  I can see she is status post port  replacement.  The angulation on the lab band is less acute, instead of  52 degrees on most recent film, it is now 65 degrees.   ASSESSMENT AND PLAN:  A 56 year old female with chronic  immunosuppression status post adjustable gastric banding with  recent  port replacement complicated by wound hematoma or question of infection.  1. CT scan of the abdomen and pelvis to make sure that she is does not      have a continuing deeper infections marching around the port given      the change in angulation in the upper abdominal pain.  2. Probable decompress of the band.  So, we are going to obtain a      Huber needle to do that and possibly culture of the fluid.  3. Sterile wound care around the wound.  4. Aggressive nausea control.  5. Consider urinalysis to make sure there is no evidence of urosepsis      that is making her feel nauseated.  6. Hypokalemia is mild.  We will observe for now, but consider      replacing more aggressively.  7. Hold her anticoagulations such as her Plavix and aspirin for now.      She tells me last one she took was yesterday morning.  8. Continue PPI.  9. Continue immunosuppression.  10.We will discuss the case with bariatric surgeon to see if they have      any insight.  Dr. Excell Seltzer will be returning within an hour and      half.      Adin Hector, MD  Electronically Signed     SCG/MEDQ  D:  06/04/2008  T:  06/04/2008  Job:  [id number]

## 2010-12-30 NOTE — Op Note (Signed)
NAMEVELMER, HOMEYER              ACCOUNT NO.:  [id number]   MEDICAL RECORD NO.:  [id number]          PATIENT TYPE:  AMB   LOCATION:  DAY                          FACILITY:  [facility]   PHYSICIAN:  [name] Kitchen T. Hoxworth, M.D.DATE OF BIRTH:  14-Jul-1956   DATE OF PROCEDURE:  05/23/2008  DATE OF DISCHARGE:                               OPERATIVE REPORT   PREOPERATIVE DIAGNOSIS:  Leaking lap band port.   POSTOPERATIVE DIAGNOSIS:  Leaking lap band port.   SURGICAL PROCEDURE:  Replacement of lap band port.   SURGEON:  [name]. Hoxworth, M.D.   ANESTHESIA:  General.   BRIEF HISTORY:  Ms. Naeve is a 55 year old female status post lap band  placement who has had excellent weight loss.  She, however, has recently  experienced weight gain and loss of restriction after fills and has had  evidence of fluid loss on three successive fills.  I have recommended  replacing the port for repair of fluid loss.  The nature of the  procedure and risks have been discussed and she is brought to the  operating room for this procedure.   DESCRIPTION OF OPERATION:  The patient was brought to the operating room  and placed in supine position on the operating table and general  anesthesia induced.  The abdomen was sterilely prepped and draped.  She  received preoperative IV antibiotics.  Correct patient and procedure  were verified.  The previous scar in the right upper quadrant was  excised and dissection carried down through the subcutaneous tissue  using cautery.  Dissection was carried down onto the capsule of the port  which was incised and the majority of the capsule completely removed  with cautery.  The sutures were cut and the port freed.  I did clamp the  tubing just on the band side of the connector and under injecting with  pressure I did not see any definite evidence of leak.  At this point the  tubing was cut and a new flushed port attached.  The port was secured to  the anterior abdominal  wall with four interrupted Prolene sutures.  The  tubing seen to course medially to the abdomen.  The port was accessed  and I left 2.4 mL in place.  Following this the subcu was closed with  running 2-0 Vicryl, skin with subcuticular 5-0 Monocryl and Dermabond.  Sponge and instrument counts were correct.  She was taken to the  recovery room in good condition.      [name]. Hoxworth, M.D.  Electronically Signed     BTH/MEDQ  D:  05/23/2008  T:  05/23/2008  Job:  [id number]

## 2010-12-30 NOTE — Consult Note (Signed)
Katherine [name], Katherine [name]              ACCOUNT NO.:  [id number]   MEDICAL RECORD NO.:  [id number]          PATIENT TYPE:  INP   LOCATION:  2106                         FACILITY:  [facility]   PHYSICIAN:  Providence Lanius, MD  DATE OF BIRTH:  August 01, 1956   DATE OF CONSULTATION:  06/04/2008  DATE OF DISCHARGE:                                 CONSULTATION   CONSULTING PHYSICIAN:  Adin Hector, MD   REASON FOR CONSULTATION:  Sepsis/septic shock.   HISTORY OF PRESENT ILLNESS:  This is a 55 year old white female who  presents to the emergency room today with approximately 16-hour history  of progressive nausea, vomiting, increased abdominal pain, subjective  fever and chills.  She underwent a recent repair of a gastric bypass  leaking band at the port site on May 23, 2008.  This was complicated  postoperatively by hematoma/fluid collection which caused the wound to  dehisce.  This was approximately 5 days following surgery.  She  following that had been treated primarily with abdominal wound packings  guided by General Surgery.  She apparently had been doing quite well  even up till June 01, 2008 per Dr. Jimmy Footman who we had spoke to from  Nephrology who had seen her in his office.  She unfortunately presented  to the emergency room with the above complaints.  Also with temperature  of 102, white blood cell count at 10.2 with 81% neutrophil count, CT  abdomen showing question of superimposed soft tissue infection following  around the gastric band port site.  Therapy today has included  approximately 2 L of crystalloid IV resuscitation, IV vancomycin and  cephazolin, she now has Zosyn pending to be hung.  The Pulmonary  Critical Care service was asked to evaluate as Ms. Volin has had  persistent systolic blood pressures in the 80s in spite of IV fluid  resuscitation.   PAST MEDICAL HISTORY:  1. Coronary artery disease with prior myocardial infarction, and      coronary artery bypass  grafting in 1995.  She has also had renal      failure status post cadaveric transplant in 1991.  2. Acute bronchitis.  3. Diabetes type 2.  4. Anemia of chronic disease.  5. Obstructive sleep apnea.  6. Morbid obesity which apparently had improved after gastric banding.      The patient reports approximately 140-pound weight loss after this.  7. History of anxiety and depression.  8. Gout.  9. Fibromyalgia.  10.Atrial fibrillation.  11.History of smoking, currently nonsmoking.  12.History of medical noncompliance.   ALLERGIES:  Currently consist of CODEINE, ERYTHROMYCIN, NUBAIN, and  SULFA.   HOME MEDICATIONS:  Prilosec, lorazepam, aspirin, Plavix, Imuran,  prednisone, Pravachol, Zoloft, Darvocet-N, Ventolin p.r.n., and  Pulmicort p.r.n.   FAMILY HISTORY:  Consists of hypertension.   SOCIAL HISTORY:  Again noted to be noncompliant by Dr. Jimmy Footman.  Currently a nonsmoker, but did have heavy smoking history.  She is  married.   REVIEW OF SYSTEMS:  Per HPI.   PHYSICAL EXAMINATION:  VITAL SIGNS:  Temperature 102 orally, heart rate  70s, blood pressure 88/45,  respirations 18, and saturation 100% on 2 L  nasal cannula.  GENERAL:  This is a obese white female currently anxious but not in  acute distress.  HEENT:  She is normocephalic.  The mucous membranes are dry.  NECK:  The neck veins are flat.  PULMONARY:  Clear to auscultation.  CARDIAC:  Regular rate and rhythm.  EXTREMITIES:  Warm, dry, 2+ pulses.  ABDOMEN:  Soft, however, she does have a mid abdominal wound which is  erythremic and warm to touch.  She complains of marked discomfort to  palpation.  Bowel sounds are hypoactive.  She has a packed abdominal  wound.  NEUROLOGIC:  Grossly intact.   LABORATORY DATA:  Hemoglobin 11.8, hematocrit 35.2, white blood cell  count 10, and platelet 143.  BNP 229.  Lactic acid 0.9.  Neutrophils  79%.  Sodium 144, potassium 3.2, chloride 108, CO2 27, BUN 17,  creatinine 0.89, and  glucose 94.   IMPRESSION AND PLAN:  1. Severe sepsis secondary to ventral wall cellulitis in the setting      of immunocompromised host following recent gastric laparotomy      banding repair.  Recommendations:  Currently agree with empiric      coverage with vancomycin and Zosyn.  We will obtain pancultures,      initiate the early goal-directive therapy protocol.  Defer wound      care to surgical team, agree with ICU admit.  We will change her to      stress dose of Solu-Cortef given long-term steroid use.  2. Adrenal insufficiency.  Plan for this is a stress dose steroid.  We      will give 7 days of Solu-Cortef 50 mg IV q.6 h.  3. History of renal transplant.  I have spoken to Dr. Jimmy Footman about      this.  He is very much would like [place] to continue Imuran and steroid      dosing given history of renal transplant.  We will continue this      for now, watching closely for occult signs of worsening infection.  4. Diabetes type 2.  Plan for this is to continue sliding scale      insulin.  5. History of coronary artery disease.  Plan for this is to continue      to monitor.  Initially cardiac enzymes were negative.  We will hold      aspirin and Plavix at this point.  6. Best practice.  Plan for this is to continue subcu heparin, as well      as proton pump inhibitors.   DISPOSITION:  Katherine [name] is currently pending transfer to the Intensive  Care where she will undergo aggressive early goal-directive therapy  protocol, as well as surgical followup for infected wound.      Salvadore Dom, NP      Michaelene Song Ellin Goodie, MD  Electronically Signed    PB/MEDQ  D:  06/04/2008  T:  06/05/2008  Job:  [id number]

## 2011-01-02 ENCOUNTER — Other Ambulatory Visit: Payer: Self-pay | Admitting: Gastroenterology

## 2011-01-02 NOTE — Cardiovascular Report (Signed)
Katherine [name], Katherine [name]                        ACCOUNT NO.:  [id number]   MEDICAL RECORD NO.:  [id number]                   PATIENT TYPE:  INP   LOCATION:  2041                                 FACILITY:  [facility]   PHYSICIAN:  [name], M.D.          DATE OF BIRTH:  09-03-1955   DATE OF PROCEDURE:  04/12/2002  DATE OF DISCHARGE:                              CARDIAC CATHETERIZATION   PROCEDURE:  Retrograde central aortic catheterization, selective coronary  angiography by Judkins technique, left ventricular angiogram RAO projection,  LAO projection hand injection, subselective left internal mammary artery,  saphenous vein graft angiography, intracoronary nitroglycerin  administration, Aggrastat bolus plus infusion, weight-adjusted heparin  monitoring ACTs, recanalization subtotal-total occlusion, saphenous vein  graft to diagonal beyond previously placed stents, balloon dilatation,  AngioJet thrombectomy, tandem 2.8/13 Pixel stent placement.   DESCRIPTION OF PROCEDURE:  The patient was brought to the second floor CP  Lab in the postabsorptive state after premedication with 5 mg of Valium p.o.  The right groin was prepped and draped in the usual manner.  Then 1%  Xylocaine was used for local anesthesia.  This was a technically difficult  and long case for many reasons which included the patient's severe obesity,  marked mesomorphic habitus and short stature and short aorta with diagnostic  and guiding catheter placement and backup difficulties.  The patient  tolerated the procedure well.  Xylocaine 1% was used for local anesthesia.  She was medicated with intravenous morphine, a total of 6 mg in divided  doses during the procedure.   Fortunately, the RFA was entered with a single anterior puncture using an 18  thin wall needle despite difficulties in locating the pulse because of her  obesity.  A 6 French short Daig side-arm sheath was inserted and the guide  wire exchange  was used throughout the procedure.  Because of the patient's  renal transplant status despite normal preoperative creatinine, limited dye  was attempted as much as possible.  Creatinine preoperatively was 1.1.  The  patient was hydrated preoperatively, given Mucomyst and Omnipaque dye was  used throughout the procedure. Diagnostic coronary angiography was done with  6 French 4 cm taper performed Cordis coronary catheters.  The right coronary  catheter was used to selectively inject the LIMA and the SVG to the  diagonal.  Several catheters had to be tried to select the SVG to the RCA.  Finally, a MP1 catheter, 6 [place] was utilized for a single injection and a  second injection was done with a 5 [place] MP1 catheter, which fit best and  was able to engage this without significant torque problems.  A pigtail  catheter was used for LV angiogram in the RAO projection of 22 cc 14 cc/sec.  Hand injection of approximately 6 cc was used for LAO injection.  Pullback  pressure across the aortic valve showed no gradient. The patient tolerated  the diagnostic procedure well and  approximately 60-70 cc of dye was  utilized.   PRESSURES:  LV:  165/0; LVEDP 18; A 24 mmHg.  CA:  165/90 mmHg.  No  gradient across the aortic valve.   FLUOROSCOPY:  Fluoroscopy showed 3+ calcification of the native coronaries  and +3/+4 mitral annular calcification.   LEFT VENTRICULAR ANGIOGRAM:  Left ventricular angiogram demonstrated  hypokinesis of the mid anterolateral wall with otherwise vigorous  contraction and EF approximately 60% with angiographic LVH and no MR  present.   The main left coronary was normal.   The LAD had 30% proximal smooth narrowing and then was totally occluded at  the SP-1 branch with no antegrade filling.   The circumflex artery was essentially unchanged from prior angiography of  October 28, 2000, and demonstrated irregularities of the moderate sized first  marginal which trifurcated  distally with no high-grade stenosis, moderate  sized second marginal which bifurcated in its midportion and small  circumflex proper.  There were no collaterals to the distal right.   The right coronary was totally occluded on flush injection after a small RV  branch in its proximal third and this is unchanged.   Saphenous vein graft to the right coronary artery showed an excellent  anastomosis to the distal RCA beyond the acute margin with excellent filling  of the bifurcating PLA and PDA with TIMI-3 flow, minimal irregularities of  the graft, excellent anastomotic site and good flow. There were no  collaterals to the left coronary system from the distal right through the  graft.   LIMA was tortuous but widely patent with an excellent anastomosis to the  junction of the midportion of the LAD.  It filled a small diagonal branch  then the LAD down to the apex and undersurface of the heart. There were  irregularities of the LAD but no significant stenosis.   Saphenous vein graft to the diagonal essentially showed TIMI-0 to 1 flow  with only a faint TIMI-1 flow beyond the previously placed stents at the  insertion site of the graft into the moderate sized bifurcating diagonal  branch. There was dye that remained within the graft suggesting associated  thrombosis.   DISCUSSION:  The patient is a 55 year old married woman with no children,  who continues to smoke one-half to one-pack of cigarettes a day and has a  dense long-term history of significant coronary artery disease.  She is  status post renal transplant in 1991 at [facility] with cadaver transplant and has  been on chronic Imuran and prednisone. She has exogenous obesity, continued  smoking, hyperlipidemia, asthmatic bronchitis. She has had multiple prior  PCIs after her initial emergency PTCA of August 07, 1993, of the LAD. She  had several PCIs ultimately resulting in progression of disease and CABG x3 by Dr. Servando Snare February 19, 1994.   Since that time, she has had stenting of her SVG to the diagonal by Dr.  [name] on Dec 25, 1999, with a 2.59 AVE stent.  She had recurrent angina  and required a tandem 2.5/8 ACS Pixel stent in the same graft overlapping  the insertion site on October 28, 2000.  She did well up until recent  recurrent chest pain compatible with unstable angina and was admitted.  Myocardial infarction was ruled out by serial enzymes and ECGs but the  patient had recurrent chest pain in the hospital on medical therapy  prompting recatheterization.   Diagnostic catheterization demonstrates culprit lesion as the SVG to the  diagonal. She  has well preserved LV function and patent LIMA to the LAD and  a patent vein graft to the distal right.   She is a potential candidate for one more attempt at salvaging the SVG to  the diagonal since this is functionally occluded at present.   Informed consent was obtained to proceed with this procedure in this  setting.  She was given Aggrastat bolus plus infusion with her normal renal  function, 150 mg of Plavix. Initially, the graft was intubated with a 6  [place] JR4 guiding catheter as the best fit with the poor fit with a 6  French left coronary bypass or AL-1.  A 0.014 inch HTF guide wire was used  to traverse the graft.  It was success in recanalizating the graft to the  diagonal and there was slow flow beyond this. A 2.0/9 Maverick balloon was  used to dilate within the previously placed stent and slightly beyond it at  8-19, 10-40, and 6-24.  This improved flow only minimally. Attempt at  passing the AngioJet using exchange technique through this guiding catheter  met with difficulty because of prolapse of the guide. It was felt that  AngioJet thrombectomy was probably necessary for any attempts at improving  this graft and possibly salvaging it so the dilatation system was upgraded.  The 6 French sheath was upgraded to a 7 [place] sheath and the SVG to  the  diagonal was intubated with a left coronary 7 French bypass catheter. The  lesion was re coursed with a an HTF extra-support 0.014 inch wire.  A 2.0/9  Maverick balloon was again used to redilate to help seat the guide. The  balloon was upgraded to a 2.5/9 Maverick which was used to dilate within the  stent at 7-20, 8-31, and 7-30 slightly beyond this. We then were able to  pass a AngioJet using exchange technique over the HTF wire. Several AngioJet  runs of 10-30 seconds were performed at an antegrade and retrograde frashing  through the graft and through the previously placed stents. The AngioJet was  then removed.  There was considerable improvement and hand injection  demonstrated high-grade stenosis beyond the stents as a residual in the  native diagonal #2. This was then redilated with a 2.5 Maverick at 5-38 with  residual stenosis and recoil.  At this point it was realized that the superior bifurcation branch of this  diagonal was occluded and the inferior bifurcation branch, which was the  larger of the two was patent.   We then placed a 2.5/13 Pixel stent tandem to the previously placed stents  and deployed at 10-45 and 15-38. IC nitroglycerin 200 mcg was  readministered, given intermittent bolus heparin, monitoring ACTs throughout  the procedure and continued on Aggrastat bolus plus infusion.   Final injections demonstrated stenosis reduction of 99-100 to less than 20%.  There was about 30 or 40% narrowing beyond the stent.  However, this  appeared to have an adequate lumen with good TIMI-3 flow restored to this  inferior branch of the diagonal. Because of the small nature of the vessel,  and we did not want to place any further stents, and I did not feel any  further dye administration to this patient was warranted, since we were  trying to conserve dye even throughout the intervention.   The patient was pain-free at this time.  The dilatation system was removed  after  final RAO and LAO injections were obtained.   Side-arm sheath was  flushed, secured to the skin with a #1 silk suture and  the patient was transferred to the holding area for ACT measurement and  ultimate sheath removal with pressure hemostasis. Final ACT was 221 seconds.   The patient has had successful recanalization, AngioJet thrombectomy and  tandem stenting of the functionally occluded SVG to the diagonal. As  mentioned, she does have a patent LIMA to the LAD and SVG to the right and  intact native marginal system.   Because of the diffuse nature of disease and multiple prior procedures on  this diagonal along with a single branch remaining, if the patient should  have recurrent problems, I would recommend elective medical therapy rather  than reintervention at this site.   CATHETERIZATION DIAGNOSES:  1. Arteriosclerotic heart disease, status post acute diaphragmatic     myocardial infarction treated with emergency angioplasty August 07, 1993.  2. Subsequent repeat percutaneous coronary interventions.  3. Progression of disease with multivessel coronary artery bypass graft, Dr.     Servando Snare, February 19, 1994, x3.  4. Saphenous vein graft - diagonal stenting, Dec 25, 1999.  5. Tandem stenting saphenous vein graft - diagonal insertion site, October 28, 2000.  6. Recurrent angina, unstable with subtotal - functional occlusion saphenous     vein graft to diagonal beyond previously placed site stents treated with     successful guide wire recanalization, rheolytic  thrombectomy and tandem     stenting, April 12, 2002.  7. Well preserved left ventricular function, ejection fraction approximately     60%, left ventricular hypertrophy mid anterior wall motion abnormality.  8. Heavy mitral annular calcification without mitral regurgitation.  9. Status post remote cadaver renal transplant, 1991, [facility], normal renal    function on immunosuppressant therapy, chronic.  10.      Severe  obesity.  11.      Steroid-induced diabetes.  12.      Hyperlipidemia.  13.      Asthmatic bronchitis, continued cigarette abuse.  14.      Anxiety and depression.                                                              [name], M.D.    RAW/MEDQ  D:  04/12/2002  T:  04/13/2002  Job:  [id number]   cc:   CP Laboratory   Troy Sine, M.D.  [address], [address] [place] [address]  Fax: Langley Deterding, M.D.  [place]  [place] [address]  Fax: [phone]

## 2011-01-02 NOTE — Op Note (Signed)
NAME:  Katherine [name], Katherine [name]                     ACCOUNT NO.:  [id number]   MEDICAL RECORD NO.:  [id number]                   PATIENT TYPE:  INP   LOCATION:  5503                                 FACILITY:  [facility]   PHYSICIAN:  Wonda Horner, M.D.                DATE OF BIRTH:  [birth year]   DATE OF PROCEDURE:  07/03/2003  DATE OF DISCHARGE:                                 OPERATIVE REPORT   PROCEDURE:  Esophagogastroduodenoscopy.   INDICATIONS FOR PROCEDURE:  Anemia, melena, heme positive stool.   PREMEDICATION:  Fentanyl 50 mcg IV, Versed 5 mg IV.   DESCRIPTION OF PROCEDURE:  With the patient in the left lateral decubitus  position, the Olympus gastroscope was inserted into the oropharynx and  passed into the esophagus. It was advanced down the esophagus and then into  the stomach and into the duodenum. The second portion and bulb of the  duodenum were normal, the stomach was normal in its entirety including the  upper fundus and cardia and seen on retroflexion. The esophagus was normal.  She tolerated the procedure well without complications.   IMPRESSION:  Normal esophagogastroduodenoscopy.                                               Wonda Horner, M.D.    [name]  D:  07/03/2003  T:  07/04/2003  Job:  [id number]   cc:   Sol Blazing, M.D.  Fax: [phone]

## 2011-01-02 NOTE — Discharge Summary (Signed)
Katherine [name], Katherine [name]                        ACCOUNT NO.:  [id number]   MEDICAL RECORD NO.:  [id number]                   PATIENT TYPE:  INP   LOCATION:  [id number]                                 FACILITY:  [facility]   PHYSICIAN:  Sol Blazing, M.D.             DATE OF BIRTH:  1956/05/03   DATE OF ADMISSION:  04/23/2002  DATE OF DISCHARGE:  05/01/2002                                 DISCHARGE SUMMARY   DISCHARGE DIAGNOSES:  1. Right lower lobe pneumonia.  2. Renal transplant.  3. Chronic obstructive pulmonary disease.  4. Hypotension.  5. Coronary artery disease, status post coronary artery bypass graft.  6. Type 2 diabetes mellitus.  7. Iron deficiency anemia.  8. Anxiety and depression.   CONSULTATIONS:  1. [name], M.D. of gastroenterology.  2. [name]. [name], M.D. [facility] of pulmonary.  3. [name], M.D. of cardiology.   PROCEDURE:  1. Upper endoscopy on April 26, 2002, by Dr. [name], negative.  2. Fiberoptic bronchoscopy with transbronchial biopsy of right upper lobe on     April 27, 2002, by Dr. [name], negative for malignancy, all cultures     negative.  3. Two-dimensional echocardiogram on April 27, 2002, by Dr. [name]. She     has mild LVH with normal systolic function, ejection fraction     approximately 50%, mild thickened aortic valve, no evidence of     intracardiac thrombus or mass, no evidence of endocarditis or abscess.     Normal descending thoracic aorta. Pulmonic valve not well visualized, but     no obvious vegetation. Mild pulmonary hypertension.  4. CAT scan of the sinuses on April 25, 2002, showed minimal sinus     disease without definite acute component.  5. CAT scan of the chest on April 25, 2002, showed patchy interstitial     opacities in both lungs, most pronounced in the upper lobes and having a     ground-glass appearance compatible with acute inflammation, borderline     mediastinal adenopathy.   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  Rollene [name], M.D. having had undergone stenting of her saphenous vein graft to  her diagonal.  She spiked a temperature on the night prior to discharge to  101 degrees.  Blood cultures were done and remained negative.  Urinalysis  was positive for cloudiness, microscopic hematuria, positive leukocyte  esterase, a few yeast were seen, and 7 to 10 white blood cells were also  seen in the high powered field. She was discharged home on ampicillin 250 mg  q.i.d.  The patient continued to have temperatures according to her, ranging  between 100 and 101 degrees.  She was  seen in the office of [place] on September 2, and changed to [place]. Chest x-ray done on  September 25 showed cardiomegaly and questionable early edema. She had been  a heavy smoker having quit two weeks prior because she had felt bad.  She  had a very bronchitic wheezing cough, productive of green sputum. She has  anterior chest wall pain with deep inspiration and cough. She has been very  nauseated, but without vomiting. She also notes her stools are dark. She  states she has had a boil on her left buttock which spontaneously drained.  She denies dysuria, allograft pain, angina, hematemesis. She is admitted to  rule out sepsis.   LABORATORY DATA:  She had a sodium 136, potassium 3.3, chloride 104, CO2 24,  BUN 15, creatinine 1.5, glucose 162.  Liver function tests within normal  limits.  Hemoglobin 9.8, white count 10,300, with 13% lymphocytes, and  platelets of 244,000.  Chest x-ray; abnormal mixed interstitial alveolar  opacity of the right lung, questionable edema versus infection.   HOSPITAL COURSE:  1. Right  lower lobe pneumonia.  The patient was admitted due to her renal     status. She was then placed on the antibiotics as well as aggressive     nebulizer bronchodilator treatments.  She was very slow to respond and     due to her transplant status, pulmonology was consulted. They changed her     to include Maxipime as well as Tequin and recommended sinus CTs which     were done and found to be negative.  They decided to take the patient for     fiberoptic transbronchial biopsies which were done on September 11 and     found to be negative.  She gradually improved and was taken off of her     Maxipime and was able to be discharged home on a course of Tequin alone     for five more days.  She is on Advair and Singulair and she will follow     up with [name], M.D. [facility] two weeks after discharge. She is     encouraged to stay off tobacco and she has quit several weeks ago.  2. Renal transplant.  As previously mentioned, the patient was admitted to a     private room for this.  She was on her usual medications. Creatinine was     1.1 on admission and remained stable throughout her hospitalization.  3. Chronic obstructive pulmonary disease.  As in #1.  4. Hypertension.  The patient's blood pressure normalized during her     hospitalization and was likely related to her fever and severe pneumonia.     She was seen by cardiology during her hospitalization as well due to her     recent stent placement and question of possible GI bleed.  She was     temporarily taken off aspirin, but was continued on Plavix and once     negative endoscopic findings were done, she was put back on aspirin a     day.  The chest pain she experienced on admission was likely related to     her pneumonia.  She will follow up per team of cardiology.  5. Coronary artery disease, status post CABG and stent as in #4.  6. Type 2 diabetes mellitus.  The patient's sugars were much higher than    usual even on her usual  steroid dose. Plans were made to  discharge her     home on a course of sliding scale Regular Insulin.  She was instructed by     the nurse prior to discharge on how to do this and she was advised to     keep track of her blood sugars and the amount of sliding scale Regular     Insulin taken and bring them to appointment with Jeneen Rinks L. Deterding, M.D.  7. Iron deficiency anemia.  The patient had some coffeeground emesis on     admission.  Dr. [name] was consulted with endoscopy done with negative     findings. Her iron levels were low. She was started on Epogen. Her     hemoglobin remained in the 9 range throughout her hospitalization.     Epogen doses will be 20,000 units q.week at shortstay. She will also be     on oral iron therapy.  The office of [place] will be     contacted to make sure this is done.  8. Anxiety/depression.  The patient will need to continue on Zoloft 150 mg     per day after discharge.   DISPOSITION:  The patient was significantly better by September 25 and able  to be discharged home.   CONDITION ON DISCHARGE:  Improved.   DISCHARGE MEDICATIONS:  1. Prevacid 30 mg per day.  2. Prednisone 7.5 mg per day.  3. Plavix 75 mg per day.  4. Glucotrol 10 mg b.i.d.  5. Actos 40 mg q.a.m.  6. Imuran 125 mg per day.  7. Pravachol 80 mg per day.  8. Singulair 10 mg q.h.s.  9. TriCor 54 mg per day.  10.      Lasix 40 mg per day.  This is a new dose.  11.      Epogen 20,000 units subcu on Fridays at shortstay.  12.      Advair 500/50 one puff twice a day.  13.      Lopressor 50 mg 1/2 tablet three times a day.  14.      KCL 20 mEq per day.  15.      Baby aspirin 81 mg per day.  16.      Tequin 400 mg per day x5 more days.  17.      Ativan 1 mg every 12 hours p.r.n. anxiety.   ACTIVITY:  As tolerated.   DIET:  No added salt, no concentrated sweets.   DISCHARGE INSTRUCTIONS:  Call Dr. Patsey Berthold at [facility] for follow-  up in two weeks.   Dr. Deterding's office will call her with appointment.  Check Accu-Cheks a.c. and h.s. The patient was given the standard sliding  scale Insulin regimen to following that starts at 200 and she was instructed  to bring blood sugar records to the next office appointment.  She was  advised to stop smoking and discourage her family members from smoking in  the house.     Alric Seton, PA                        Sol Blazing, M.D.    MB/MEDQ  D:  05/26/2002  T:  05/29/2002  Job:  [id number]   cc:   Sol Blazing, M.D.   [name], MD  [address] [place]  [place], [name] [address]  Fax: [phone]   [name]. [name], M.D. [facility]   [name], M.D.

## 2011-01-02 NOTE — H&P (Signed)
NAMEQUANASIA, Katherine [name]              ACCOUNT NO.:  [id number]   MEDICAL RECORD NO.:  [id number]          PATIENT TYPE:  INP   LOCATION:  [id number]                         FACILITY:  [facility]   PHYSICIAN:  Sol Blazing, M.D.DATE OF BIRTH:  [birth year]   DATE OF ADMISSION:  09/12/2006  DATE OF DISCHARGE:                              HISTORY & PHYSICAL   REASON FOR ADMISSION:  Shortness of breath, febrile illness.   HISTORY OF PRESENT ILLNESS:  A 55 year old white female with end-stage  renal disease status post cadaveric renal transplant in 1991, who also  has type 2 diabetes mellitus, coronary artery disease, and obesity with  a history of chronic bronchitis in the past who presents to the  emergency room with shortness of breath   DICTATION ENDS AT THIS POINT.      Nonah Mattes, P.A.      Sol Blazing, M.D.  Electronically Signed    RRK/MEDQ  D:  09/12/2006  T:  09/13/2006  Job:  [id number]

## 2011-01-02 NOTE — H&P (Signed)
NAMETRACIE, BROSE                        ACCOUNT NO.:  [id number]   MEDICAL RECORD NO.:  [id number]                   PATIENT TYPE:  INP   LOCATION:  2032                                 FACILITY:  [facility]   PHYSICIAN:  Virgilio Belling. Ezzie Dural, MD             DATE OF BIRTH:  [birth year]   DATE OF ADMISSION:  06/05/2003  DATE OF DISCHARGE:                                HISTORY & PHYSICAL   HISTORY OF PRESENT ILLNESS:  Katherine [name] is a 55 year old white woman who  is admitted to [facility] for further evaluation of chest pain.  The patient has a history of cardiac disease, which dates back to 1995 at  which time she underwent coronary artery bypass surgery.  This was performed  after a failed percutaneous intervention.  She received a left internal  mammary artery graft to the left anterior descending, a saphenous vein graft  to the diagonal and a saphenous vein graft to the right coronary artery.  Since then she has undergone multiple percutaneous interventions to the  diagonal saphenous vein grafts.  These occurred in May 2001, March 2003 and  August 2003.  After the last procedure it was felt that this vessel was no  longer a candidate for further intervention and medical therapy was advised.   The patient has a number of other medical problems.  These include:  1. End-stage renal disease - she is status post a renal transplant in 1991.  2. The patient also has a history of steroid-induced noninsulin-dependent     diabetes mellitus.  3. Hyperlipidemia.  4. Chronic obstructive pulmonary disease .  5. Hypertension.  6. Morbid obesity.   The patient presented to the emergency department with a three-week history  of chest pain.  She has experienced nearly daily episodes of chest pain  during this time.  Episodes occur at random and appear not to be related to  position, activity, meals or respirations.  They have lasted approximately  10 minutes and resolve  spontaneously.  There are no exacerbating or  ameliorating factors.  They are associated with dyspnea, but no diaphoresis  or nausea.  She presented to the emergency department today after  experiencing an episode of chest pain, which lasted 90 minutes.  It seemed  to be unaffected by the three nitroglycerin tablets, which she took.  Her  chest pain has resolved at this time.  It seemed to radiate to her shoulders  bilaterally as did the episodes which she experienced in the last three  weeks.  The patient is free of chest pain at this time.  The chest pain is  described as an ache across her anterior chest.   ALLERGIES:  The patient is reportedly allergic to a number of medications  and these include SULFA, CODEINE, ERYTHROMYCIN, NUBAIN, and TETRACYCLINE.   MEDICATIONS:  The patient is on a long list of medications and these  include:  1. Prednisone 5 mg p.o. daily.  2. Ativan 1 mg p.o. b.i.d.  3. Regular insulin sliding scale.  4. Aquatab GFN 1200/DM p.o. b.i.d.  5. Prevacid 30 mg p.o. b.i.d.  6. Potassium chloride 20 mEq p.o. daily.  7. Pyridine 20 mg p.o. b.i.d.  8. Aspirin 81 mg p.o. daily.  9. Glucotrol 15 mg p.o. b.i.d.  10.      Furosemide 80 mg p.o. b.i.d.  11.      Pravachol 80 mg p.o. H.S.  12.      Zoloft 150 mg p.o. H.S.  13.      TriCor 2 p.o. at H.S.  14.      Singulair 10 mg p.o. H.S.  15.      Actos 45 mg p.o. H.S.  16.      Propranolol p.o. H.S.  17.      Promethazine 25 mg p.o. H.S. p.r.n.  18.      Metoprolol 25 mg p.o. daily.  19.      Darvocet 2 p.o. b.i.d.  20.      Nitroglycerin SL p.r.n.  21.      Imuran 125 mg p.o. daily as well as a number of inhaler     medications.   SOCIAL HISTORY:  The patient lives with her husband.  She babysits.  She  continues to smoke.  She does not drink.   PAST SURGICAL HISTORY:  Previous operations have included:  1. Coronary artery bypass graft surgery.  2. Kidney transplant.  3. Repair of a radial head fracture.    SIGNIFICANT INJURIES:  None.   REVIEW OF SYSTEMS:  Review of systems reveals no new problems related to her  head, eyes, ears, nose, mouth, throat, lungs, gastrointestinal system,  genitourinary system, or extremities.  There is no history of neurologic or  psychiatric disorder.  There is no history of fever, chills or weight loss.   PHYSICAL EXAMINATION:  VITAL SIGNS:  Blood pressure 113/54, pulse 68 and  regular, respirations 18 and temperature 97.9.  GENERAL APPEARANCE:  The patient is an obese middle-aged white woman in no  discomfort.  She is alert, oriented, appropriate and responsive.  HEENT:  Head, nose, eyes and mouth are normal.  NECK:  The neck is without thyromegaly or adenopathy.  Carotid pulses are  palpable bilaterally without bruits.  HEART:  Cardiac examination reveals a normal S1 and S2.  There is no S3, S4,  murmur, rub, or click.  Cardiac rhythm is regular.  CHEST:  No chest wall tenderness is noted.  LUNGS:  The lungs are clear.  ABDOMEN:  The abdomen is soft and nontender.  There is no mass  hepatosplenomegaly, bruit, distention, rebound, guarding, or rigidity.  Bowel sounds are normal.  PELVIC, RECTAL AND GENITALIA:  Pelvic, rectal and genital examinations are  not performed as they are not pertinent to reason for acute care  hospitalization.  EXTREMITIES:  The extremities are without edema deviation or deformity.  Radial and dorsalis pedis pulses are palpable bilaterally.  NEUROLOGIC EXAMINATION:  Brief screening neurologic survey is unremarkable.   LABORATORY DATA:  Electrocardiogram reveals normal sinus rhythm with  evidence of a prior inferior myocardial infarction.  There are nonspecific  ST and T wave changes in the lateral leads.  Chest radiography is pending at  the time of this dictation.  The initial set of cardiac enzymes reveals a CK MB of less than 1.0, myoglobin 87.3 and troponin less than 0.05.  A second  set revealed a CK MB of less than 1.0,  myoglobin 73.7 and troponin less than  0.05.  Potassium is 3.2, BUN 20 and creatinine 1.2.  The remaining studies  are pending at the time of this dictation.   IMPRESSION:  1. Chest pain, rule out angina.  2. Coronary artery disease, status post coronary artery bypass surgery in     1995 with a left internal mammary graft to the left anterior descending,     saphenous vein graft to the right coronary artery and a saphenous vein     graft to the diagonal.  3. Status post stents to the diagonal saphenous vein graft in May 2001,     March 2003 and August 2003.  4. End-stage renal disease status post renal transplant.  5. Hyperlipidemia, steroid-induced.  6. Chronic obstructive pulmonary disease .  7. Hypertension.  8. Morbid obesity.   PLAN:  1. Telemetry.  2. Serial cardiac enzymes.  3. Aspirin.  4. Heparin.  5. Nitrol paste.  6. Further evaluation per Dr. [name].  7. Discontinue smoking.   This patient encounter was chaperoned by Nurse S. Maryjo Rochester.                                                  Virgilio Belling. Ezzie Dural, MD    MSC/MEDQ  D:  06/06/2003  T:  06/06/2003  Job:  [id number]

## 2011-01-02 NOTE — Discharge Summary (Signed)
NAMECARLENE, [name]              ACCOUNT NO.:  [id number]   MEDICAL RECORD NO.:  [id number]          PATIENT TYPE:  INP   LOCATION:  5508                         FACILITY:  [facility]   PHYSICIAN:  Alric Seton, P.A.   DATE OF BIRTH:  1956/03/11   DATE OF ADMISSION:  08/09/2005  DATE OF DISCHARGE:  08/13/2005                                 DISCHARGE SUMMARY   DISCHARGING PHYSICIAN:  Dr. Jeneen Rinks Deterding.   DISCHARGE DIAGNOSES:  1.  Acute on chronic bronchitis versus pneumonia.  2.  Renal transplant.  3.  Type 2 diabetes mellitus with hypoglycemic episodes.  4.  Coronary artery disease status post coronary artery bypass graft.  5.  Hyperlipidemia.  6.  Obesity status post gastric banding.  7.  Anxiety/depression.  8.  Anemia.  9.  Malnutrition.  10. Abnormal CAT scan of the chest August 09, 2005.   CONSULTS:  Dr. Excell Seltzer.   PROCEDURES:  1.  CAT scan of the chest August 09, 2005, showed area of question at the      left lung base that represents a vague parenchymal opacity on the CT of      the chest and may be inflammatory in nature.  There was also a subtle      reticulonodular pattern in the upper lobes, right greater than left.      Follow-up CT is recommended.  2.  Lap band.  Fluid in the esophagus possibly due to banding.  3.  Prominent thyroid gland with areas of low attenuation, most consistent      with goiter.  4.  Modified barium swallowing study August 12, 2005 was normal.  No      evidence of aspiration.   HISTORY OF PRESENT ILLNESS:  Katherine [name] is a 55 year old white female  with a history of renal transplant, who also has type 2 diabetes mellitus  and a five-day history of nausea, vomiting, malaise, fatigue, and refractory  non-productive cough with shortness of breath.  She was started on Ceftin  August 07, 2005 and now has fever in the emergency room.  Her white blood  count was 19,900 with 90% neutrophils.  Chest x-ray showed no acute disease  and left lung nodule.  Creatinine was 0.8, hemoglobin 11.7.  She was  admitted to rule out sepsis, give IV antibiotics and check chest CT to  follow-up nodule.   HOSPITAL COURSE:  1.  Acute on chronic bronchitis versus pneumonia.  The patient's antibiotics      were changed to Zithromax and Avelox, then later Avelox alone.  She was      treated with many nebulizer treatments, along with antibiotics.  Lung      function gradually improved.  Chest CT showed abnormal finding, as      mentioned under procedures.  This will be followed up with a repeat      chest x-ray three months after discharge.  The patient was much improved      at the time of discharge and she will complete a full two week course of      Avelox.  She continues on her same dose of Prednisone, and is on      Pulmicort and Singulair as before.  She also has a follow-up appointment      at [facility] September 11, 2005 to make sure this issue      has resolved.  2.  Renal transplant.  Renal function remained normal during her      hospitalization with a creatinine of 0.8 to 1.  There was no change in      her medications.  3.  Type 2 diabetes mellitus.  The patient was hypoglycemic several times      during her hospitalization due to nausea and vomiting from a lap band      being too tight.  This was loosened after a consultation with Dr.      Excell Seltzer, and nausea and vomiting resolved as did hypoglycemia.  There      will be no change in her diabetes medicines.  4.  Coronary artery disease status post CABG stable.  5.  Hyperlipidemia, stable.  Continues on Pravachol 80 mg per day.  6.  Obesity status post lap band, as mentioned in #3.  The patient's lab      band was adjusted by Dr. Excell Seltzer.  She appeared to have a pooling of      liquid in her esophagus, which was contributing to the nausea and      vomiting.  She is to follow-up with Dr. Excell Seltzer two weeks after      discharge.  7.  Anxiety/depression.   The patient continues on Ativan and Zoloft.  Her      Ativan and Darvocet are prescribed by Dr. Justine Null.  8.  Anemia.  Hemoglobin was noted to drop from 11.7 to 10.7 during her      hospitalization.  She did have some hematemesis initially on admission,      which resolved.  CBC will be followed up in the outpatient setting.  9.  Malnutrition.  Albumin was low on admission at 2.3.  It was down to 1.9      at discharge.  This may in part be related to inspections, as well as      diminished intake from gastric banding.  The albumin will be rechecked      with the renal panel at her next visit at [facility].   CONDITION AT DISCHARGE:  Improved.   DISCHARGE DISPOSITION:  The patient was discharged to home with essentially  the same medications.   DISCHARGE MEDICATIONS:  1.  Avelox 400 mg daily x10 days.  2.  Prednisone 5 mg daily.  3.  Imuran 125 mg every day.  4.  Plavix 75 mg per day.  5.  Aspirin 81 mg per day.  6.  Pravachol 80 mg per day.  7.  Actos 45 mg per day.  8.  Glucotrol 15 mg two times daily.  9.  Pulmicort two puffs two times a day.  10. Tricor 140 mg daily.  11. Lasix 80 mg b.i.d.  12. Singulair 10 mg daily.  13. Imdur 60 mg daily.  14. Protonix 40 mg daily.  15. Zoloft 50 mg q.h.s.  16. Ativan 1 mg b.i.d. p.r.n.  17. Darvocet N-100 one to two tablets b.i.d. p.r.n. pain.   ACTIVITY LEVEL:  As tolerated.   DISCHARGE DIET:  Low-sodium, diabetic.  There is no fluid restriction.  She  is to have a repeat chest CT in three months  at [facility].  She  will be seen at [facility] September 11, 2005 at 10:45 a.m.,  and she is to call Dr. Lear Ng office for a follow-up appointment in two  weeks after discharge.      Alric Seton, P.A.     MB/MEDQ  D:  08/24/2005  T:  08/25/2005  Job:  [id number]   cc:   Jeneen Rinks L. Deterding, M.D.  Fax: [phone]  [name]. Hoxworth, M.D.  [address], [place]  [place]  [address]

## 2011-01-02 NOTE — Op Note (Signed)
   Katherine [name], Katherine [name]                        ACCOUNT NO.:  [id number]   MEDICAL RECORD NO.:  [id number]                   PATIENT TYPE:  INP   LOCATION:  5523                                 FACILITY:  [facility]   PHYSICIAN:  [name], M.D.             DATE OF BIRTH:  03-May-1956   DATE OF PROCEDURE:  04/27/2002  DATE OF DISCHARGE:                                 OPERATIVE REPORT   PROCEDURE:  Transesophageal echocardiography.   COMPLICATIONS:  None.   INDICATIONS:  The patient is a 55 year old female, patient of Dr. Kelly Splinter, with a history of coronary artery disease, status post coronary  artery bypass surgery with PCI of the vein graft to the diagonal, history of  renal transplant, on chronic immunosuppression, history of chronic fevers,  who is now referred for TEE to rule out endocarditis.   DESCRIPTION OF PROCEDURE:  After given informed written consent, the patient  was brought to the endoscopy suite in a fasting state. The patient then  received 75 mg of Demerol and 2 mg of Versed.  After adequate anesthesia,  the patient underwent successful and uncomplicated transesophageal  echocardiography.   RESULTS:  1. There was mild LVH with normal LV systolic function, estimated EF of 60%.  2. There was mildly thickened aortic valve and no significant area of     stenosis, and mild aortic regurgitation.  3. There is mitral annular calcification with mild thickening in the     posterior mitral valve leaflet and mild mitral regurgitation.  4. Obstruction on the tricuspid valve with mild tricuspid regurgitation.  5. There is mild pulmonary hypertension, estimated PA pressure of 45 to 50     mmHg.  6. Pulmonic valve was poorly visualized. However, there is no evidence of     obvious vegetation.  7. There is no evidence of intracardiac mass, thrombi, or vegetation.  8. There is a small patent foramen ovale noted with minimal left to right     and right to left shunting  by color and contrast echo.  9. Normal descending thoracic aorta.  10.      No evidence of endocarditis or abscess.   CONCLUSION:  Successful transesophageal echocardiography with findings  listed above.                                               [name], M.D.    RHM/MEDQ  D:  04/27/2002  T:  04/29/2002  Job:  [id number]   cc:   Sol Blazing, M.D.

## 2011-01-02 NOTE — Consult Note (Signed)
Katherine [name], Katherine [name]                        ACCOUNT NO.:  [id number]   MEDICAL RECORD NO.:  [id number]                   PATIENT TYPE:  INP   LOCATION:  [id number]                                 FACILITY:  [facility]   PHYSICIAN:  Cleotis Nipper, M.D.             DATE OF BIRTH:  [birth year]   DATE OF CONSULTATION:  04/24/2002  DATE OF DISCHARGE:                                   CONSULTATION   REASON FOR CONSULTATION:  The nephrology service asked [place] to see this 55-  year-old female because of apparent GI bleeding.   HISTORY:  The patient was admitted to the hospital yesterday because of  pneumonia, and at that time, it was noted that her hemoglobin had dropped  progressively over the preceding two weeks, from 12.3 to 11.6 to 10.7, to an  admission value of 9.8; this was in association with some dark stools,  although there was no real change in bowel habit to suggest classic melena;  specifically, the stools were not loose or tarry.   Risk factors for ulcer disease include exposure to aspirin and Plavix.  About six weeks ago, the patient underwent her most recent coronary artery  stenting and that time, was switched from full-dose adult aspirin to one  baby aspirin daily, but Plavix was added to her regimen for the first time  ever.  The patient has no prior history of ulcer disease or GI bleeding.  In  fact, she has a history of GERD and has been maintained on Prevacid 30 mg  b.i.d. with good compliance for a long time and the current dark stool  occurred despite being on that medication.   Also in the background is a family history of colon cancer in her father,  diagnosed around age 29.   The patient has chronic tendencies toward her stomach feeling raw due to  the large amount of medication she takes but it does not sound as though  there has been any recent dramatic development of upper tract  symptomatology.   ALLERGIES:  Multiple allergies including CODEINE, NUBAIN and  ERYTHROMYCIN  (nausea, vomiting) and SULFA and TETRACYCLINE (mouth blisters).   CURRENT MEDICATIONS IN THE HOSPITAL:  1. Protonix 40 mg b.i.d.  2. Plavix 75 mg daily.  3. Glucotrol.  4. Prednisone 7.5 mg daily.  5. Imuran.  6. Actos.  7. Lopressor.  8. Pravachol.  9. Zoloft.  10.      Lasix.  11.      Atrovent, Ventolin and Singulair inhalers.  12.      Tricor.  13.      Tequin.  14.      Rocephin.  15.      Ativan 1 mg b.i.d. orally.   OPERATIONS:  Three-vessel CABG, 1995; renal transplant at [facility] in 1991.   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   HABITS:  The patient used to smoke but does so no longer.  Nondrinker.   FAMILY HISTORY:  Colon cancer in her father at age 11, viral hepatitis  leading to cirrhosis and death in her mother (this past 11-30-22).  Negative  for gallstones or ulcers.   SOCIAL HISTORY:  Not obtained.   REVIEW OF SYSTEMS:  Good appetite, has gained about 30 pounds since her  mother's death in 11/30/2022.  No true dysphagia symptoms, although occasionally  food is slow to go down.  No classic reflux symptoms at present, but had  been previously.  Does periodically have a raw sensation in her stomach.  Typically has a daily bowel movement, although occasionally becomes  constipated, for example, in the hospital.   PHYSICAL EXAMINATION:  GENERAL:  A substantially obese, articulate, pleasant  female in no acute distress, anicteric, no frank pallor.  CHEST:  Chest has bilateral rhonchi.  HEART:  Normal.  ABDOMEN:  Subjectively mildly tender in the epigastric or mid-abdominal  region.  No palpable hepatosplenomegaly or impressive abdominal tenderness  or peritoneal findings.  RECTAL:  Exam  was performed yesterday and was not repeated, showing at that  time dark-brown, strongly heme-positive stool.   LABORATORY DATA:  Hemoglobin dropped from 9.8 to 9.2 overnight.  BUN dropped  from 15 to 10.  Platelets on admission were normal, as were liver  chemistries.  Albumin 2.8 on admission.   IMPRESSION:  1. Subacute gastrointestinal bleed almost certainly of upper tract origin,     given the fairly abrupt drop in hemoglobin over the past six weeks.     Doubt groin hematoma, since the patient has not noticed any large bruise.  2. Family history of colon cancer.  3. Multiple medical problems including history of end-stage renal disease,     now status post renal transplant with good renal function, fibromyalgia,     sleep apnea, chronic obstructive pulmonary disease, and significant     coronary disease.   DISCUSSION:  The patient is now showing signs of active bleeding.  I agree  with intensive antipeptic therapy with b.i.d. Protonix as is currently being  done.  I am somewhat concerned by the fact that the current problem occurred  in the face of PPI therapy and therefore, I think it is important to define,  to the degree possible, the exact pathophysiology of her blood loss.  The  nature, purpose and risks of endoscopy in this setting were discussed with  the patient, and in particular, I think we need to consider her pulmonary  status, given her history of sleep apnea, her current pneumonia, and her  underlying COPD.  I think that with cautious and mild sedation, that  endoscopic evaluation can be conducted with reasonable safety of this  patient and it would be helpful in both defining her diagnosis and  developing the preventive strategies for a similar recurrence in the future.  The patient is agreeable to proceed and this test will be scheduled for a  couple of days from now when hopefully her lungs are a bit more stable.  Eventually, the patient might be a candidate for  screening colonoscopy, in  view of her family history of colon cancer in her father, but I think that  this is of relatively lower priority and, given the  patient's significant co-  existing medical problems, especially coronary disease, a case could be made  for watchful waiting, perhaps with periodic Hemoccult determinations, as  opposed to putting her through either a barium enema or a colonoscopy.   I appreciate the opportunity to have seen this patient in consultation with  you.                                                Cleotis Nipper, M.D.    RVB/MEDQ  D:  04/24/2002  T:  04/25/2002  Job:  [id number]   cc:   Ollen Gross C. Florene Glen, M.D.  [place]  [place] [address]  Fax: [place]. [name], M.D.  [phone] [address] [place] [address]  Fax: [phone]

## 2011-01-02 NOTE — H&P (Signed)
Katherine [name], Katherine [name]              ACCOUNT NO.:  [id number]   MEDICAL RECORD NO.:  [id number]          PATIENT TYPE:  INP   LOCATION:  [id number]                         FACILITY:  [facility]   PHYSICIAN:  Sol Blazing, M.D.DATE OF BIRTH:  03/30/1956   DATE OF ADMISSION:  09/12/2006  DATE OF DISCHARGE:                              HISTORY & PHYSICAL   REASON FOR ADMISSION:  Shortness of breath, febrile illness,  dehydration.   HISTORY OF PRESENT ILLNESS:  [age]-year-old white female with end-  stage renal disease, status post cadaveric renal transplant in 11/30/1989 with  baseline creatinine of approximately 1.0 or less, who also has type 2  diabetes mellitus, coronary artery disease status post CABG, obesity  with obstructive sleep apnea and a history of chronic bronchitis due to  severe tobacco abuse in the past, presents to the emergency room with  shortness of breath, dyspnea on exertion, malaise, arthralgias, fevers,  chills, excessive coughing that is nonproductive, clear rhinorrhea,  earaches, occasional vomiting, loss of appetite, decreased oral intake  with symptoms progressively worsening over the last 2 weeks.  Today, she  presents to the ER due to worsening shortness of breath, specifically.  She denies dysuria, diarrhea, abdominal pain, transplant pain.  She has  not coughed up any blood.  In the emergency room, her pressure is lower  than usual with systolic BPs in the 0000000.  She is being admitted for  acute on chronic bronchitis requiring IV hydration and antibiotics.   ALLERGIES:  1. CODEINE.  2. ERYTHROMYCIN.  3. NUBAIN.  4. SULFA.   CURRENT MEDICATIONS:  1. Prilosec 20 mg daily.  2. Prednisone 5 mg daily.  3. Imuran 125 mg daily.  4. Plavix 75 mg daily.  5. TriCor 145 mg daily.  6. Zoloft 150 mg daily.  7. Baby aspirin 81 mg daily.  8. Ativan 1 mg b.i.d.  9. Pravachol 80 mg q.p.m.  10.Glucotrol XL 5 mg daily.  11.Singulair 10 mg daily.  12.Advair MDI b.i.d.  13.EPO 4000 units, currently on hold.  14.Darvocet-N 100 p.r.n.  15.Potassium chloride 20 mEq daily.  16.Rhinocort p.r.n.   PAST MEDICAL HISTORY:  1. End-stage renal disease.  Cadaveric renal transplant in 1989-11-30.  2. Obesity, status post gastric banding June of 2006.  3. Obstructive sleep apnea.  4. Anemia of chronic disease.  5. Chronic anxiety depression.  6. Coronary artery disease status post coronary artery bypass      grafting, followed by [facility].  7. Hyperlipidemia.  8. History of chronic bronchitis due to severe tobacco abuse in the      past.  9. Possible goiter.  10.History of gout.  11.Fibromyalgia.   SOCIAL HISTORY:  Patient currently does not smoke, but has in the past  heavily.  She denies any alcohol use.  She is married.  Patient is  without children.   FAMILY HISTORY:  Positive for hypertension.  Mother died 11/30/01 of  cirrhosis at the age of 64.   REVIEW OF SYSTEMS:  As in HPI.   PHYSICAL EXAMINATION:  On admission:  VITAL SIGNS:  Blood  pressure is 98/58.  Heart rate 80.  Temperature  98.7.  Respirations are 18.  IN GENERAL:  Patient is chronically ill, white female in no acute  distress.  She is awake, alert, appropriate and oriented x3.  SKIN:  Warm and dry.  No wounds.  HEENT:  Normocephalic, atraumatic.  Pupils are equal, round, reactive to  light.  Tympanic membranes are injected and mildly bulging, but with  good light reflex.  She has poor dentition and gingivitis is present.  Posterior pharynx is clear.  LUNGS:  Breath sounds are audible throughout with expiratory wheezes  audible when coughing.  No pleuritic rub audible.  HEART:  Regular rate and rhythm.  Grade 2/6 systolic murmur, left  sternal border greater than right upper sternum.  No rub audible.  ABDOMEN EXAM:  Obese.  Positive bowel sounds.  Soft, nontender,  nondistended.  There is a palpable valve in the right abdomen, which  pertains to her gastric banding  surgery.  Her left lower quadrant  Allograft is essentially nontender and not enlarged.  EXTREMITIES:  Without edema.   Chest x-ray is without active disease.   Hemoglobin is 14.4, white count is 11.7, platelets are 252,000.  Sodium  138, potassium 3.5, chloride 105, CO2 24, BUN 11, creatinine 0.8,  glucose 94.  Urinalysis:  Specific gravity of 1.022, pH is 5.5, positive  moderate leukocyte esterase with 3-6 white blood cell count per high  powered field.  No blood.   ASSESSMENT/PLAN:  1. Acute on chronic bronchitis.  We will use empiric antibiotics and a      pulmonary toilet.  2. Cadaveric renal transplant with creatinine at baseline.  Continue      prednisone, Imuran and potassium chloride (no diuretics as an      outpatient).  3. Mild hypovolemia.  We will replete with IV fluids.  4. Type 2 diabetes mellitus.  5. Anemia of chronic disease with EPO on hold.  6. Coronary artery disease status post coronary artery bypass graft.  7. Obstructive sleep apnea.  8. Obesity.  9. History of gastric banding, 2006.      Nonah Mattes, P.A.      Sol Blazing, M.D.  Electronically Signed    RRK/MEDQ  D:  09/12/2006  T:  09/13/2006  Job:  [id number]

## 2011-01-02 NOTE — Discharge Summary (Signed)
[place]. [facility]  Patient:    Katherine [name], Katherine [name] Visit Number: [id number] MRN: [id number]          Service Type: MED Location: [id number] Attending Physician:  Sol Blazing Dictated by:   Foye Clock, P.A.C. Admit Date:  08/05/2001 Discharge Date: 08/07/2001   CC:         [facility]   Discharge Summary  ADMISSION DIAGNOSES: 1. Nausea and vomiting, abdominal pain, diarrhea, low grade temperature. 2. Renal transplant in 1991. 3. Chronic obstructive pulmonary disease, on CPAP. 4. Sinusitis, recurrent. 5. Diabetes mellitus, steroid induced. 6. Chronic anxiety.  DISCHARGE DIAGNOSES: 1. Acute pancreatitis. 2. Hypovolemia. 3. Kidney transplant. 4. Chronic obstructive pulmonary disease on CPAP. 5. Morbid obesity. 6. Chronic anxiety. 7. Diabetes mellitus, steroid induced. 8. Recent sinusitis.  BRIEF HISTORY AND REASON FOR ADMISSION:  Ms. Swonger is a 55 year old white female with end-stage renal disease, status post functioning renal transplant now, morbid obesity, who presented to the ER with 12-hour history of refractory nausea, vomiting, diarrhea not responding to outpatient antiemetics.  She had some mild subjective fever, vague epigastric pain started after she was eating. She had reported sore throat due to vomiting. She was started on amoxicillin three days ago, on July 20, 2001, for sinusitis.  She has history of recurrent sinusitis.  She has not had any high fever or chills.  She had dry aching cough but no shortness of breath and she had one chest pain episode in the ER. She has known coronary artery disease. Also known fibromyalgia, COPD, sleep apnea on CPAP.  Seen and examined by Roney Jaffe, M.D., in the ER with a temperature of 100.6, blood pressure 120/70.  Chest exam was clear.  Abdomen soft and obese, decreased bowel sounds throughout with mild epigastric tenderness.  LAB DATA ON ADMISSION:  Creatinine 1.1, BUN 21, sodium 140, potassium 5.4. White count 10.9, hematocrit 57, platelets 202. Total bilirubin 1.8, mildly increased SGOT of 109, SGPT to 52, amylase 141, lipase 96, all mildly increased.  Alkaline phosphatase 153.  EKG showed normal sinus rhythm.  Two-way of her abdomen was limited due to body habitus but showed no signs of obstruction.  HOSPITAL COURSE:  She was admitted for observation because of her nausea and vomiting, diarrhea, fever, and kidney transplant status. She was cultured with blood cultures obtained by Dr. Jonnie Finner.  She continued on her CPAP.  Abdominal ultrasound in the morning to rule out gallstones. Started on clear liquids and IV antiemetics.  On the second day of admission she noted she felt somewhat better after epigastric pain was controlled.  Ultrasound showed no gallstones and common bile duct was normal.  She was placed on vancomycin because of her febrile illness and also IV Zosyn.  It was thought she may have passed a stone initially and was made NPO except for medications.  She was feeling somewhat better. She wanted to try solids by July 25, 2001. She said she had family members at home with similar symptoms.  Thought that now it could have been a viral illness with some acute pancreatitis picture, with mildly elevated hepatic enzymes, transiently, Dr. Justin Mend considered ____________.  She was tolerating solids and deemed ready for discharge home on July 26, 2001. She was to have follow-up in the office with labs to be checked and kidney functions checked at that time.  Her ultrasound showed no evidence of gallstones, common bile duct was normal. Amylase and lipase improved.  Also  she had transiently elevated levels of LFTs; i.e., her AST, ALT, and total bilirubin elevated. Follow-up AST, ALT, ALP, and total bilirubin on July 24, 2001, and July 25, 2001, were all within normal limits.  In terms of her renal function, serum  creatinine was 1.1 at the time of admission, 1.0 at time of discharge.  BUN 21 and BUN of 9 at time of discharge.  CO2 was normal throughout hospital stay.  She had no troubles with urine output.  Urinalysis showed no evidence of infection and blood cultures were negative at the time of dictation.  MEDICATIONS AT TIME OF DISCHARGE:  1. Imuran 125 mg q.d.  2. Prednisone 7.5 mg q.d.  3. Imdur 30 mg q.d.  4. Zoloft 150 mg q.d.  5. Pravachol 80 mg q.h.s.  6. Singulair 20 mg q.d.  7. Actos 45 mg q. breakfast.  8. Glucotrol 5 mg q. breakfast.  9. Advair one puff b.i.d. 10. Nitroglycerin 0.4 mg sublingual p.r.n. chest pain. 11. Metoprolol 50 mg q.12h. 12. Prevacid 30 mg q.h.s. 13. Phenergan 25 mg q.6h. p.r.n. 14. Ativan 1 mg b.i.d. p.r.n. 15. Darvocet-N 100 one q.6h. p.r.n.  FOLLOW-UP:  She had follow-up with Jeneen Rinks L. Deterding, M.D., on August 18, 2001, with labs to be checked, CMET and CBC the same day.Dictated by:   Foye Clock, P.A.C. Attending Physician:  Sol Blazing DD:  08/30/01 TD:  08/31/01 Job: [id number]

## 2011-01-02 NOTE — H&P (Signed)
[place]. [facility]  Patient:    Katherine [name], Katherine [name]                       MRN: [id number] Adm. Date:  12/24/99 Attending:  Delfino Lovett A. [name], M.D. Dictator:   Alroy Bailiff, P.A. CC:         [name], M.D.             James L. Deterding, M.D.                         History and Physical  HISTORY OF PRESENT ILLNESS:  A 55 year old white female with history of CAD, status post CABG in 1995, history of hyperlipidemia, ongoing tobacco abuse, history of atrial fibrillation, history of renal transplant in 1991 presents to [facility] Emergency Room with complaints of chest pain.  Ms. Fenley states that at approximately 1 p.m. today, she had the onset of chest pain.  She had just had an argument with the husband about that time. She states that the pain was in the middle of her chest, went to bilateral shoulders and bilateral arms.  There was no radiation to the neck. She had no shortness of breath, no diaphoresis, no nausea or vomiting. She did have some increased fatigue. She states that she took a sublingual nitroglycerin at home.  The pain eased in approximately 15 minutes and continued to be eased at about the time of EMS arrival.  The pain has now returned in her left arm, but she is having no pain in her chest at this time.  Additionally, she states that she had a decreased appetite throughout today.  She states that these symptoms are similar to her previous angina.  Prior to today, she has not been having any chest pain even with exertion such as going up and down her stairs or with baby-sitting. She states that she has had quite a bit of increased stress recently with her husband and financially.  PAST MEDICAL HISTORY:  1. Coronary artery disease.     a. Status post two ___ prior to her CABG.     b. Status post CABG in 1995 by Dr. Servando Snare.     c. Status post Persantine Cardiolite May 28, 1998 that showed apical        scar in  the LAD territory but no ischemia.  EF 65%.     d. No catheterization since her CABG.     e. Hyperlipidemia.     f. Ongoing tobacco use.  2. Obesity.  3. Status post renal transplant in 1991.  4. History of atrial fibrillation.  5. Fibromyalgia.  6. Sleep apnea.  7. Chronic anxiety.  8. Asthma.  9. Echocardiogram on September 11, 1999 revealed normal LV function, mild MR,     mild TR, moderate left atrial enlargement. 10. Status post mouth surgery and now had upper and lower dentures. 11. 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 12. She has had Port-A-Cath and history of catheter for the peritoneal     dialysis in the past.  MEDICATIONS:  1. Zoloft 75 mg q. h.s.  2. Prednisone 10 mg q.d.  3. Nitroglycerin 0.4 mg sublingual p.r.n.  4. Lopressor 50 mg b.i.d.  5. Ativan 1 mg b.i.d.  6. Pravachol 80 mg q. h.s.  7. Imuran 125 mg q. h.s.  8. Tricor questionable dose b.i.d.  9. Prevacid 30 mg q.d. 10.  Enteric coated aspirin 325 mg q.d. 11. Darvocet p.r.n. 12. Zyrtec p.r.n. 13. She uses an inhaler that she says is spelled Advair one puff b.i.d. 14. ___ one b.i.d. 15. Maxaquin for sinus infection.  ALLERGIES:  SULFA, CODEINE, ERYTHROMYCIN, TETRACYCLINE.  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 increased stress recently with her husband and finances. Continues to smoke about five cigarettes per day at this time.  FAMILY HISTORY:  Noncontributory.  REVIEW OF SYSTEMS:  No hematochezia, no melena, no hematuria.  She does have sleep apnea. She has some swelling secondary to prednisone use and vein grafts. She no history of stroke or TIA.  PHYSICAL EXAMINATION:  VITAL SIGNS:  Heart rate 57, blood pressure 136/100.  GENERAL:  Mildly obese white female, somewhat tearful.  Appears in no acute distress.  NECK:  Reveals no JVD.  LUNGS:  Reveal scattered rhonchi.  Few expiratory wheeze.  HEART:  Regular rhythm with rare PVCs.  ABDOMEN:   Protuberant.  Positive bowel sounds.  VASCULAR:  No edema.  DIAGNOSTIC DATA:  EKG reveals sinus bradycardia, 58 beats per minute. Occasional PVCs, slight ST depression in lead I and aVL, but there is no change since EKG of September 05, 1999.  Chest x-ray pending.  Labs pending.  IMPRESSION AND PLAN:  1. Unstable angina.  We will admit her to a telemetry unit to monitor and     check cardiac enzymes to rule out myocardial infarction.  She will be     placed on IV heparin, IV nitroglycerin, aspirin, Plavix, and home     medications.  She will be n.p.o. in the morning until we decide whether     she requires catheterization.  2. Sinus infection on antibiotics already.  3. Emotional overload with high stress situation.  She states that the chest     pain did begin while arguing with her husband.  As well she is quite     concerned of her husband coming here to the hospital to visit her and is     afraid of a "fight."  4. History of coronary artery disease, status post coronary artery bypass     grafting with six-year-old grafts.  5. Hyperlipidemia.  We will check a fasting lipid profile at this time.  6. Ongoing tobacco use.  7. Obesity.  8. Status post renal transplant in 1991.  9. History of atrial fibrillation. 10. Fibromyalgia. 11. Sleep apnea. 12. Chronic anxiety. 13. Echocardiogram September 11, 1999. 14. Asthma.  In addition to the above cardiac medicines, we will also make sure she is on some inhaler therapy given her rhonchi and wheezing on exam. DD:  12/24/99 TD:  12/24/99 Job: [id number]

## 2011-01-02 NOTE — Assessment & Plan Note (Signed)
[facility]                             PULMONARY OFFICE NOTE   NAME:Katherine [name], [place]                     MRN:          [id number]  DATE:11/23/2006                            DOB:          [birth year]    HISTORY OF PRESENT ILLNESS:  The patient is a 55 year old white female  patient of Dr. Patsey Berthold who has a known history of asthmatic bronchitis,  obstructive sleep apnea, and chronic sinusitis, who presents for an  acute office visit.  The patient complains of a 6-day history of  productive cough, nasal congestion, and intermittent wheezing.  The  patient denies any hemoptysis, orthopnea, PND, or leg swelling.  The  patient was recently seen in the office 3 weeks ago for sinusitis, and  was given 10 days of Avelox, which she reports symptoms did resolve.   PAST MEDICAL HISTORY:  Reviewed.   CURRENT MEDICATIONS:  Reviewed.  Note, the patient is somewhat confused  about her medications.  Reports she is taking Pulmicort and Atrovent  nebulizer, as well as Advair.   PHYSICAL EXAM:  The patient is a pleasant female, in no acute distress.  She is afebrile with stable vital signs.  O2 saturation is 99% on room  air.  HEENT:  Unremarkable.  NECK:  Supple without cervical adenopathy.  No JVD.  LUNGS:  Reveal diminished breath sounds in the bases with a few  expiratory wheezes.  The patient does have some upper airway pseudo-  wheezing.  CARDIAC:  Regular rate.  ABDOMEN:  Soft and nontender.  EXTREMITIES:  Warm without any edema.   IMPRESSION AND PLAN:  Acute bronchitic exacerbation.  The patient is to  begin Omnicef x5 days.  Tussionex #4 ounces 1 teaspoon every 12 hours as  needed for cough.  The patient is to return back here as scheduled in 1  month or sooner if needed.  The patient is recommended to continue on  Pulmicort and Atrovent nebulizer 3 times a day.  However, I have  recommended to stop Advair at this time, and the patient is advised to  bring in her medications at the next visit for followup.     Rexene Edison, NP  Electronically Signed      Renold Don, MD  Electronically Signed   TP/MedQ  DD: 11/24/2006  DT: 11/24/2006  Job #: [id number]

## 2011-01-02 NOTE — Discharge Summary (Signed)
Katherine [name], Katherine [name]                        ACCOUNT NO.:  [id number]   MEDICAL RECORD NO.:  [id number]                   PATIENT TYPE:  INP   LOCATION:  6522                                 FACILITY:  [facility]   PHYSICIAN:  [name], M.D.          DATE OF BIRTH:  1956/03/06   DATE OF ADMISSION:  04/10/2002  DATE OF DISCHARGE:  04/13/2002                                 DISCHARGE SUMMARY   DISCHARGE DIAGNOSES:  1. Unstable angina, complex saphenous vein graft to diagonal and native     diagonal intervention this admission.  2. Known coronary disease, coronary artery bypass grafting in 1995 with     saphenous vein graft to diagonal intervention in May 2001 and again in     March 2002.  3. End-stage renal disease, status post renal transplant, followed by Dr.     Jimmy Footman.  4. Obesity.  5. Chronic obstructive pulmonary disease.  6. Non-insulin-dependent diabetes mellitus secondary to chronic steroids.  7. Dyslipidemia with history of NIASPAN intolerance.  8. Anxiety.   HOSPITAL COURSE:  The patient is a 55 year old female followed by Dr.  [name] and Dr.. Deterding who had renal transplant at [facility] in 1991.  She  has had coronary disease and had bypass surgery in 1995.  In May of 2001,  she had SVG to diagonal stent placed.  In March 2003, she had overlapping  stent to the same site.  Other grafts include LIMA to the LAD, SVG to RCA.  Her EF is 50%.  She was seen in the office 04/10/2002 with increasing chest  pain consistent with unstable angina. She was admitted to telemetry, started  on heparin.  CK-MB and troponin were obtained and were negative for MI.   She was seen in consult by the renal service prior to catheterization.  She  was also seen by psychiatry service,  Dr. Rhona Raider, who recommended  continuing her current medications.   On 04/12/2002, she underwent catheterization which revealed patent SVG to the  RCA, patent LIMA to the LAD, and 99% SVT to the  diagonal at the anastomosis.  She underwent complex intervention to this site.  Dr. [name] notes that  this is the last attempt to intervene at this site.  If she has recurrent  symptoms, will plan medical therapy.  She was put on Aggrastat for 18 hours.  She was put on aspirin and Plavix.   She did spike a fever on 8/28 and had some pharyngeal erythema. Cultures  were obtained.  She was put on ampicillin for presumed pharyngitis.  She  will need to be seen in the office in about a week or so in followup and  then will follow up with [place].  Creatinine at discharge is 1.3.   DISCHARGE MEDICATIONS:  1. Plavix 75 mg a day.  2. Glucotrol 10 mg in the morning, 5 mg at night.  3. Prednisone 7.5 mg a day.  4. Actos 45 mg a day.  5. Imuran 125 mg a day.  6. Lopressor 25 mg b.i.d.  7. Zoloft 150 mg a day.  8. Prevacid 30 mg twice a day.  9. Pravachol 80 mg a day.  10.      Aspirin 81 mg a day.  11.      Singulair 10 mg a day.  12.      TriCor 64 mg a day.  13.      Lasix as taken at home.  14.      Ativan 1 mg b.i.d. p.r.n.  15.      Ampicillin 250 mg 4 times a day for 7 days.  16.      Nitroglycerin sublingual p.r.n.   LABORATORY DATA:  At discharge, her white count 9.1, hemoglobin 10.7,  hematocrit 32.6, platelets 230.  Sodium 137, potassium 3.8, BUN 13,  creatinine 1.3, glucose 194.  TSH 0.57.  Folate level 12.7, B12 383.  Urinalysis negative for nitrite and leukocyte esterase.  INR 0.8.  Liver  functions are normal except for a slightly elevated ALP of 126. CK-MB and  troponin were negative.   Chest x-ray shows mild cardiomegaly.   EKG shows sinus rhythm, left ventricular hypertrophy, left axis deviation.   DISPOSITION:  The patient is discharged in stable condition and will follow  up in the office in a week with the office extender and then see Dr.  [name] in a few weeks.     Erlene Quan, P.A.                      [name], M.D.    Meryl Dare  D:   04/13/2002  T:  04/16/2002  Job:  [id number]

## 2011-01-02 NOTE — Cardiovascular Report (Signed)
Katherine [name], Katherine [name]                        ACCOUNT NO.:  [id number]   MEDICAL RECORD NO.:  [id number]                   PATIENT TYPE:  INP   LOCATION:  6533                                 FACILITY:  [facility]   PHYSICIAN:  Bryson Dames, M.D.             DATE OF BIRTH:  08-13-1956   DATE OF PROCEDURE:  06/07/2003  DATE OF DISCHARGE:                              CARDIAC CATHETERIZATION   PROCEDURES PERFORMED:  1. Selective coronary angiography by Judkins technique.  2. Retrograde left heart catheterization without ventricular angiography.  3. Selective visualization of the left internal mammary artery graft.  4. Selective visualization of saphenous vein grafts to diagonal and right     coronary artery.  5. Percutaneous coronary intervention of the saphenous vein graft to     diagonal at the distal anastomotic site with placement of a new Taxus     stent.   ENTRY SITE:  Right femoral.   DYE USED:  Omnipaque.   MEDICATIONS GIVEN:  Angiomax was given during the procedure and was turned  off after the procedure was complete.  The ACT during Angiomax infusion was  about 370.  Intravenous nitroglycerin for anginal pain.  Intravenous Demerol  for pain.  Intravenous fentanyl for sedation.   PATIENT PROFILE:  The patient is a 55 year old diabetic obese smoker who has  undergone coronary artery bypass grafting in the past on February 19, 1994 and at  that time a left internal mammary artery graft was placed to the LAD.  A  vein graft was placed to the diagonal branch of the LAD and a saphenous vein  graft placed to the right coronary artery.  Since that time the patient has  had multiple catheterizations including a total of three stent procedures by  [name], M.D. and [name], M.D. on the distal  anastomotic site of the vein graft to diagonal.  The patient represents at  this time with unstable angina with negative cardiac enzymes for  recatheterization.  This  was accomplished by the right percutaneous femoral  approach without complications.   RESULTS:  PRESSURES:  The left ventricular pressure was 0000000, end-  diastolic pressure 35.  Central aortic pressure 160/95, mean of 120.  No  aortic valve gradient by pullback technique.   ANGIOGRAPHIC RESULTS:  1. The left main coronary artery was normal in appearance.  2. The left anterior descending coronary artery was 100% occluded in the mid     portion of the vessel just beyond a septal perforator branch.  3. The left circumflex was a medium sized vessel giving rise to a large     first obtuse marginal branch which was normal, a second obtuse marginal     branch containing a 50% stenosis at the ostium of this vessel, and the     circumflex itself contained luminal irregularities proximally of about 30-     40% and good distal  runoff into the mid and distal circumflex.  4. The right coronary artery showed a recannulized total occlusion in the     mid portion of the vessel and an acute marginal branch arising before     this recannulized occlusion.  5. The saphenous vein to diagonal branch showed a 95% stenosis that was in-     stent and at the distal anastomotic site.  The diagonal beyond the site     had two branches of small to medium size.  6. The saphenous vein graft to RCA was widely patent throughout its course.     The internal mammary artery graft to LAD was widely patent throughout its     course with distal filling of the LAD which appeared normal and also some     retrograde flow into the mid LAD.   The percutaneous coronary intervention was then performed using 6-French  guide catheters and 6-French sheaths.  An attempt was made to find the ideal  guide catheter for this patient's diagonal saphenous vein graft.  No such  catheter was found that gave good back-up.  Multiple catheters were tried.  The guide catheter that the procedure was performed with was a 6-French FR4  right  guide catheter without side holes.  Before resorting to that I tried a  left coronary bypass guide, a left Amplatz #1 guide, a multipurpose hockey  stick guide, and several others.  Nothing provided good back-up support.   The guidewire used during this procedure was a long Patriot wire.  Pre  dilatation of the stenotic lesion in the vessel was accomplished with a 2.0  x 9 mm Maverick balloon followed by a 2.5 x 15 Maverick balloon.  I then  tried to place a cutting balloon down the vessel at the site of 90% in-stent  restenosis, but was unable to pass the cutting balloon.  I then resorted to  a 2.5 x 15 Quantum Maverick balloon taken up to high inflation pressures.   I was then able to cross the in-stent restenosis which had now been reduced  to about 30% with a 2.5 x 20 mm Taxus stent and I positioned it so that we  would get coverage of the diagonal itself beyond the distal anastomotic site  stenosis and adequate coverage of the entire preexisting stent in the distal  saphenous vein graft.  Balloon inflations to 17 atmospheres on two occasions  yielded an anticipated lumen diameter of 2.88 and there was smooth contour  of vessel with the 95% stenosis reduced to 10% residual with good flow in  one branch of the diagonal distally and in the other branch there was some  jailing of about 50-70%.  IV nitroglycerin was continued as well as IC  nitroglycerin given and the patient was removed from the table and sent to  the holding area with stable vital signs.   FINAL DIAGNOSES:  1. Multivessel coronary artery disease.     a. 100% mid left anterior descending occlusion.     b. 100% occlusion of mid right coronary artery.  2. Status post percutaneous intervention of the distal anastomotic site of     the saphenous vein graft to diagonal on three previous occasions:  Dec 25, 1999, October 28, 2000, and April 12, 2002. 3. In-stent restenosis of the previously placed overlapping stents in  distal     anastomotic site of saphenous vein graft to diagonal, 95% severe.  4. Successful restenting of  the distal anastomotic site with a drug eluting     stent 2.5 x 20 mm Taxus stent with reduction of 90% lesion to 10%     residual.  At the completion of the case Angiomax was discontinued.  The     patient was continued on intravenous nitroglycerin.  There was noted to     be jailing of one diagonal branch distally to the stent which was felt to     have a good prognosis for resolution with intravenous nitrates.  The     patient was given 600 mg of Plavix after the procedure and 20 mg     intravenous Pepcid.                                               Bryson Dames, M.D.    WHG/MEDQ  D:  06/07/2003  T:  06/07/2003  Job:  [id number]   cc:   Cath Lab

## 2011-01-02 NOTE — Discharge Summary (Signed)
NAMEJACQUETTA, Katherine [name]              ACCOUNT NO.:  [id number]   MEDICAL RECORD NO.:  [id number]          PATIENT TYPE:  INP   LOCATION:  2023                         FACILITY:  [facility]   PHYSICIAN:  Katherine [name], M.D.   DATE OF BIRTH:  09-Jun-1956   DATE OF ADMISSION:  06/22/2005  DATE OF DISCHARGE:  06/27/2005                                 DISCHARGE SUMMARY   DISCHARGE DIAGNOSES:  1.  Generalized weakness.  2.  Orthostatic hypotension.  3.  Fevers/leukocytosis.  4.  Diabetes mellitus type 2.  5.  Nausea and vomiting, status post gastric banding procedure.  6.  Anemia of chronic disease.  7.  End-stage renal disease status post transplant, now on      immunosuppressants.  8.  Fibromyalgia.  9.  Coronary artery disease, status post myocardial infarction.  10. Coronary artery bypass grafting in 1996.  11. Sleep apnea on C-PAP.  12. Chronic obstructive pulmonary disease.   DISCHARGE MEDICATIONS:  1.  Ativan 1 milligram p.o. b.i.d.  2.  Prevacid 30 milligrams 1 tablet p.o. daily.  3.  Aspirin 81 milligrams p.o. daily.  4.  Zoloft 150 milligrams p.o. daily.  5.  Rhinocort q.h.s.  6.  Tricor 145 milligrams p.o. daily.  7.  Pravachol 80 milligrams p.o. daily.  8.  Aqua-tab 1 tablet b.i.d.  9.  Glucotrol 5 milligrams p.o. daily.  10. Advair resume home dose.  11. Pulmicort and Atrovent nebulizers daily.  12. Singulair 10 milligrams p.o. daily.  13. Prednisone 5 milligrams p.o. daily.  14. Imuran 125 milligrams p.o. daily.  15. Darvocet-N 100 2 tablets p.o. b.i.d.  16. Plavix 75 milligrams p.o. daily.  17. Epogen 4000 units subcutaneously monthly.  18. Ensure nutritional supplement three times a day.  19. Augmentin 500 milligrams q.12h. x6 days.   CONDITION ON DISCHARGE:  The patient is improved at the time of discharge.  She should follow up with Dr. Jimmy [name] at the [facility] for a  hospital follow-up appointment. At which point, her CBC should be checked in  regards to a white blood cell count and her hemoglobin. She should also have  a BMET to follow up on her potassium. Her CBGs should also be reviewed at  this hospital follow-up appointment and any decision made to restart her  Actos or her Lantus should be made at this point. She should also have her  blood pressure checked at this visit and if her blood pressure tolerates we  may consider restarting her Lasix and K-Dur.   PROCEDURE:  A chest x-ray done on June 22, 2005 shows status post CABG,  no acute disease. A 2-D echocardiogram done on June 23, 2005 shows  overall normal left ventricular systolic function with an EF of 55-65%. A CT  of the sinuses on June 26, 2005 shows no changes of acute or chronic  sinusitis.   BRIEF HISTORY:  Katherine [name] is a 55 year old white female status post renal  transplant now on immunosuppressive's who is admitted for a history of  fatigue, weakness, sore throat, intermittent fevers, nausea, vomiting, sinus  congestion, and  a recent strep infection for the past two weeks. She comes  in for management of these problems. Please see the admission H&P.   HOSPITAL COURSE:  Problem 1:  GENERALIZED WEAKNESS/ORTHOSTATIC  HYPOTENSION/LEUKOCYTOSIS/FEVERS:  This patient was admitted for two to three  days of feeling generalized weakness. She is status post a strep infection  and was treated with seven days of amoxicillin; however, she continues to  feel poorly. She comes in complaining of a low blood pressure taken at the  [facility] today. She also complains of very vague symptoms on admission.  Blood cultures were done and were negative. Urine cultures were done and  were negative. A cardiac workup was pursued with a negative echocardiogram,  a normal EKG, negative enzymes. Cortisol was drawn and the random cortisol  was 1.5, although she appropriately responded to [facility] stimulation. Over the  course of the hospitalization, she did become febrile to a  103. She also  spiked a white blood cell count as high as 17.8. She was started on Cipro at  the point of admission. Rocephin was added two days into the admission and  during the course of the hospitalization her antibiotics were changed to  Zosyn. She also received IV fluids. Throughout the course of the  hospitalization, it was unclear as to what the source of the infection was.  Two days prior to admission, the patient complained of sinus tenderness. So  a CT of the sinus was ordered and that was negative for any acute or chronic  signs of sinusitis. At the time of discharge, the patient was clinically  improved. She had been afebrile for over 24 hours. Her white blood cell  count the day before admission was 15.5; however, it was trending down and  she was not orthostatic at the time of discharge. She will need to have a  CBC at her hospital follow-up appointment to check her white blood cell  count to make sure it is trended down appropriately. She is discharged on  Augmentin 500 milligrams p.o. q.12h. for a total course of a 10-day  treatment.   Problem 2:  DIABETES MELLITUS TYPE 2:  The patient comes in with a history  of diabetes. There is some discrepancy in her home medications. As best as I  can tell, her home medications include Glucotrol 5 milligrams p.o. daily,  Actos 45 milligrams p.o. daily, and Lantus 10 units q.h.s.  Upon admission,  the patient was not eating well and therefore she was only started on her  Glucotrol; however, initially her records from the kidney center showed her  to be on 15 milligrams of Glipizide. So, she was started on this dose. Her  blood sugars continued to trend down and she had several episodes of  hypoglycemia. At which point, her glipizide was held. Her complete blood  glucoses began to come back up and she was started back on her Glucotrol 5  milligrams daily. Her complete blood glucoses were stable throughout the rest of her  hospitalization and at the time of discharge ranged from 94 to  123. Therefore, she was discharged only on Glucotrol 5 milligrams p.o.  daily. She was told to hold her Actos and her Lantus and to check her blood  sugars. Actos and Lantus can be restarted as needed. Of note, her hemoglobin  A1c was excellent at 5.1.   Problem 3:  NAUSEA AND VOMITING:  The patient is status post gastric banding  procedure done approximately 18 months  ago. She often struggles with nausea  and vomiting secondary to this procedure. She was encouraged to take p.o. at  the time of admission; however, she continued to feel nauseated and vomited  several times. She was given Phenergan and Zofran which controlled her  nausea and vomiting and at the time of discharge she was keeping down her  food well. She was felt to be back to her baseline.   Problem 4:  ANEMIA:  The patient came in with a hemoglobin of 10.8. Over the  course of her hospitalization, her hemoglobin fell to 8.5. This was thought  to be due to hemodilution as well as chronic kidney disease. She did get  Epogen injection several days prior to admission and was scheduled to get  InFeD on June 22, 2005. However, she was admitted to the hospital that  day and never received that dose. Therefore, we gave her InFeD 500  milligrams infusion on two consecutive days. Stool guaiacs are pending at  the time of discharge. She should have her hemoglobin checked at her  hospital follow-up appointment. Of note, there were no signs of gross  bleeding from her gastrointestinal tract.   Problem 5:  STATUS POST TRANSPLANT:  The patient was continued on her  immunosuppressants.   Problem 6:  HYPOKALEMIA:  The patient came in with a potassium of 3.2. At  the time of discharge, her potassium was 3.9. Of note, she was told to hold  her K-Dur that she takes on a regular basis as her Lasix is being held as  well secondary to her hypotension.   LABORATORY DATA:  At  the time of discharge, the patient's vital signs were  temperature of 97.9, pulse of 60, respirations 20, blood pressure 94 to  100/50 to 58. She was 95% on room air. Of note, orthostatics on June 26, 2005 shows a lying blood pressure to be 94/50 with a pulse of 60, sitting  blood pressure to be 82/42 with a pulse of 72 and a standing blood pressure  to be 88/48 with a pulse of 65.   Her labs on the day of discharge include a CBC with a white blood cell count  of 15.5, hemoglobin 8.5, hematocrit 25, platelets 236,000. A BMET done on  June 25, 2005 shows sodium of 138, potassium 3.9, chloride 106,  bicarbonate 24, BUN 8, creatinine 1, glucose 68, calcium 9.0. Other  significant labs include a hemoglobin A1c of 5.1. Hepatic function panel  including total bilirubin of 0.3, direct bilirubin of 0.3, alkaline  phosphate of 86, AST 40, ALT 21, total protein 7.4, albumin 2.8. Urine  culture done on June 24, 2005 shows 9000 colonies per mL read as insignificant growth. Blood cultures from June 24, 2005 showed no growth  to date. Random cortisol was 1.5. baseline cortisol was 4.0 with a rise to  19.3 after ACTH administration. There are no pending labs at the time of  discharge.      Baxter Kail, M.D.      Katherine [name], M.D.  Electronically Signed    DC/MEDQ  D:  06/27/2005  T:  06/28/2005  Job:  [id number]   cc:   [facility]

## 2011-01-02 NOTE — Consult Note (Signed)
NAME:  Katherine [name], BOZZELLI                        ACCOUNT NO.:  [id number]   MEDICAL RECORD NO.:  [id number]                   PATIENT TYPE:  INP   LOCATION:  5503                                 FACILITY:  [facility]   PHYSICIAN:  Wonda Horner, M.D.                DATE OF BIRTH:  [birth year]   DATE OF CONSULTATION:  07/01/2003  DATE OF DISCHARGE:                                   CONSULTATION   REASON FOR CONSULTATION:  This patient is a 55 year old Caucasian female  with a history of renal transplant in 1991 and coronary artery disease.   She underwent cardiac catheterization with coronary stent placement on  June 07, 2003, and was sent home on aspirin and Plavix.  Since that time,  she has been experiencing intermittent melena.  She was admitted to the  hospital last night by Dr. Jonnie Finner because of the intermittent melena and  findings of anemia.  Her hemoglobin was 8.6, and her stools were found to be  brown, but hemoccult positive according to Dr. Jonnie Finner.   The patient had heme-positive stools a year ago and underwent EGD by Dr.  [name] which turned out to be normal.  A colonoscopy was recommended, but  never performed because the patient did not get around to having it done.  There is a positive family history of colon cancer in her father.  The  current concern is that the patient needs to be restarted on aspirin and  Plavix, but because of the hemoccult positive stool and anemia and history  of melena, evaluation of the GI tract is needed.   PAST MEDICAL HISTORY:  1. End-stage renal disease, status post renal transplant in 1991.  2. Noninsulin dependent diabetes mellitus.  3. Hyperlipidemia.  4. Chronic obstructive pulmonary disease.  5. Hypertension.  6. Obesity.  7. Coronary artery disease.   ALLERGIES:  SULFA, CODEINE, ERYTHROMYCIN, NUBAIN, TETRACYCLINE.   CURRENT MEDICATIONS:  1. Lopressor.  2. Prednisone.  3. Imuran.  4. Ativan.  5. K-Dur.  6. Pyridium.  7.  Protonix.  8. Glucotrol.  9. Furosemide.  10.      Zoloft.  11.      Tricor.  12.      Singulair.  13.      Actos.  14.      Pravachol.  15.      Pulmicort.  16.      Nasonex.  17.      Isosorbide mononitrate.  18.      Phenergan.  19.      Darvocet N 100.  20.      Nitroglycerin.  21.      Atrovent.  22.      Albuterol.   SOCIAL HISTORY:  He does not drink alcohol.  He does smoke.   PRIOR SURGERIES:  1. Coronary artery bypass graft.  2. Kidney transplant.  3. Radial head fracture surgery.  REVIEW OF SYSTEMS:  No complaints of fever or chills.  No chest pain or  shortness of breath.  No cough or sputum production.   PHYSICAL EXAMINATION:  GENERAL:  She does not appear in any acute distress  at this time.  She is not icteric.  HEART:  An irregular rhythm, no murmurs, gallops or rubs.  LUNGS:  Clear.  ABDOMEN:  Bowel sounds are normal, soft, nontender, no hepatosplenomegaly.  EXTREMITIES:  No edema.   IMPRESSION:  1. Heme-positive stool.  2. Anemia.  3. Family history of colon cancer.  4. Multiple other medical problems as stated above.   PLAN:  1. At this point, there is concern about starting the patient back on     aspirin and Plavix because of her recent melena, heme-positive stools and     anemia.  There is also the family history of colon cancer in her father     which has never been addressed.  2. I would recommend at this point that the patient undergo EGD and     colonoscopy.  Her prior EGD one year ago was completely normal according     to Dr. Osborn Coho report.  I think that she should undergo both EGD and     colonoscopy at this time.  She will need a good preparation to be cleaned     out in order to see the colon.  This will be done.  Hopefully, we can     accomplish both procedures at one sitting in a couple of days after a     good preparation.                                               Wonda Horner, M.D.    [name]  D:  07/01/2003  T:   07/01/2003  Job:  [id number]   cc:   Sol Blazing, M.D.  Fax: [phone]   [name], M.D.  [address] [place]  [place], [place] [address]  Fax: [phone]   [name], M.D.  [phone] [place] [address]  Fax: [place]. [address], [place] [address]  [place], [place] [address]  Fax: [phone]

## 2011-01-02 NOTE — H&P (Signed)
[place]. [facility]  Patient:    Katherine [name], Katherine [name] Visit Number: [id number] MRN: [id number]          Service Type: MED Location: [id number] Attending Physician:  Sol Blazing Dictated by:   Roney Jaffe, M.D. Admit Date:  07/23/2001                           History and Physical  REASON FOR ADMISSION:  Vomiting.  HISTORY OF PRESENT ILLNESS:  The patient is a 55 year old Caucasian female with end-stage renal disease, morbid obesity, and a functioning renal transplant.  She presented to the emergency room with a 12 hour history of refractory nausea, vomiting, diarrhea, not responding to outpatient antiemetics.  She has had some mild subjective fever, and some vague epigastric pain which started after she was heaving.  She also has a sore throat due to vomiting.  She was started on amoxicillin three days ago on July 20, 2001, for sinusitis.  She has recurrent sinusitis.  She has not had any high fever or chills.  She has a dry aching cough.  No shortness of breath.  She did have one chest pain episode in the emergency room.  She has known coronary artery disease.  PAST MEDICAL HISTORY:  1. 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 in 1991, on Imuran and prednisone.  2. Coronary artery disease, status post coronary artery bypass graft in 1995.  3. Obesity.  4. Hyperlipidemia.  5. Chronic obstructive pulmonary disease and sleep apnea, on nasal CPAP at     night.  6. Hypertension.  7. Fibromyalgia.  8. Anxiety.  9. History of atrial fibrillation. 10. Recurrent sinusitis.  MEDICATIONS:  1. Amoxicillin 500 mg t.i.d. started 07/20/01.  2. Sublingual nitroglycerin p.r.n.  3. Pravachol 80 mg q.h.s.  4. Singular 10 mg q.d.  5. Advair 1 puff b.i.d.  6. Maxair p.r.n.  7. Actos 45 mg q.d.  8. Glucotrol 5 mg q.a.m.  9. Zoloft 150 mg q.d. 10. Imuran she believes is 125 mg q.d. 11. Prednisone  7.5 mg q.d.  ALLERGIES:  SULFA, CODEINE, MYCINS, AND TETRACYCLINE.  SOCIAL HISTORY:  The patient is married, no children, smokes 5 cigarettes per day, no ETOH.  REVIEW OF SYSTEMS:  Noncontributory.  PHYSICAL EXAMINATION:  VITAL SIGNS:  Temperature 100.6, blood pressure 120/70, pulse 100, respiratory rate 38 initially, now 16.  GENERAL:  This is a massively obese white female who is alert, somewhat chronically ill-appearing with cushingnoid face.  She is in no distress.  She is laying almost flat.  SKIN:  Multiple ecchymoses of the legs mostly.  HEENT:  Conjunctivae are injected.  Throat shows mild thrush, but no exudates or erythema.  The right tympanic membrane is slightly prominent and injected. Left tympanic membrane shows no bulging.  NECK:  Supple with no JVD.  CHEST:  Clear throughout without rales or wheezing.  CARDIAC:  Regular rate and rhythm without murmurs, rubs, or gallops.  ABDOMEN:  Soft, obese, decreased bowel sounds throughout with mild epigastric tenderness.  No mass, no organomegaly, no peritoneal signs.  EXTREMITIES:  No significant peripheral edema.  Multiple ecchymoses over the lower extremities.  Pulses are present in both feet.  NEUROLOGIC:  Alert, grossly nonfocal exam.  LABORATORY DATA:  Sodium 140, potassium 5.4, CO2 25, BUN 21, creatinine 1.1, white blood cell count 10.9, hematocrit 47%, platelets 202.  Total bilirubin  1.8, mildly increased SGOT 109, SGPT 52, amylase 141, lipase 96, all mildly increased.  Alkaline phosphatase 153.  ______ abdomen limited due to body habitus, but no pattern of obstruction noted.  EKG showed normal sinus rhythm, no acute changes.  IMPRESSION: 1. Nausea and vomiting, abdominal pain, and diarrhea, with low grade    temperatures.  Multiple possibilities including antibiotic reaction    (amoxicillin), viral gastroenteritis, or other systemic infection with    secondary gastrointestinal symptoms.  Also with  slightly increased liver    function tests and pancreatic enzymes.  We will need to consider    pancreatitis and cholecystitis. 2. Renal transplant in 1991.  Imuran and prednisone. 3. Chronic obstructive pulmonary disease with obstructive sleep apnea on CPAP    at night and inhalers. 4. Sinusitis, treated with amoxicillin since 07/20/01, this has been a    recurrent problem. 5. Diabetes mellitus.  Steroid induced.  Hold Actos and Glucotrol until she is    feeling better. 6. Chronic anxiety.  PLAN: 1. Observe in the hospital off of antibiotics (discontinue Augmentin). 2. Watch temperature curve, and follow up culture results. 3. Abdominal ultrasound in the morning. 4. CPAP at night. 5. Diabetes mellitus, hold Glucotrol and Actos as above. 6. Symptomatic treatment of nausea and vomiting with Reglan, Protonix, and    Zofran. 7. Clear liquids, advance as tolerated. Dictated by:   Roney Jaffe, M.D. Attending Physician:  Sol Blazing DD:  07/24/01 TD:  07/24/01 Job: [id number]

## 2011-01-02 NOTE — Discharge Summary (Signed)
[name], [name]                        ACCOUNT NO.:  [id number]   MEDICAL RECORD NO.:  [id number]                   PATIENT TYPE:  INP   LOCATION:  5503                                 FACILITY:  [facility]   PHYSICIAN:  Thornell Mule, M.D.                DATE OF BIRTH:  February 08, 1956   DATE OF ADMISSION:  06/30/2003  DATE OF DISCHARGE:  07/04/2003                                 DISCHARGE SUMMARY   ADMISSION DIAGNOSES:  1. Anemia.  2. History of renal transplant.  3. Diabetes type 2.  4. Chronic obstructive pulmonary disease.  5. Coronary artery disease.  6. Metrorrhagia.  7. Urinary tract infection.  8. Upper respiratory infection.  9. Obstructive sleep apnea.   DISCHARGE MEDICATIONS:  The patient was to continue all her previous  medications which per the list that we have include:  1. Prednisone 5 mg p.o. daily.  2. Imuran 125 mg daily.  3. Ativan 1 mg b.i.d.  4. Sliding scale Humalog t.i.d. with bedtime coverage.  5. Prevacid 30 mg p.o. b.i.d.  6. Potassium chloride 20 mEq daily.  7. Pyridium 200 b.i.d. times three days.  8. Aspirin 81 mg daily.  9. Plavix 75 mg daily.  10.      Glucotrol 50 mg b.i.d.  11.      Lasix 80 mg b.i.d.  12.      Atrovent 0.02% nebulizer treatment b.i.d. p.r.n.  13.      Pulmicort one vial daily nebulizer 0.25 mg.  14.      Rhinocort nasal spray daily.  15.      Zoloft 125 mg q.h.s.  16.      TriCor 54 mg two tablets q.h.s.  17.      Singulair 10 mg p.o. q.h.s.  18.      Actos 25 mg q.h.s.  19.      Imdur 20 mg q.h.s.  20.      Phenergan 25 mg q.h.s. p.r.n. nausea.  21.      Lopressor 25 mg b.i.d.  22.      The patient is also to continue Levaquin as it was initiated prior     to her coming to the hospital.   FOLLOW UP INSTRUCTIONS:  The patient was to call [facility] meeting to arrange  for counseling services.  The patient was also to set up a gynecological  evaluation with Dr. Cheryln Manly who has been following her in the past.  Next,  she is to call [facility] to schedule an appointment to be seen in  about a week post-discharge to get a CBC done.  The patient expresses  understanding of this instruction for follow up.   BRIEF HISTORY OF PRESENT ILLNESS:  Katherine [name] is a 55- year-old female with  a history of renal transplant in 1999 who has been having some dark stools,  increased fatigue, and on a routine follow up with  her outpatient doctor, it  was noted that her hemoglobin had dropped to 8.7 quite drastically.  The  patient was thus admitted to rule out a possible GI bleed.   HOSPITAL COURSE BY PROBLEM LIST:  1. Anemia.  Given the patient's history of melena and acute drop in her     hemoglobin, it was suspected that she had a GI source of bleeding.  Her     Plavix and her aspirin were held.  She was transfused two packs of PRBC.     Blood pressure remained stable throughout hospitalization.  She did     require to continue on her antihypertensive.  GI was consulted.  GI did     and evaluation and endoscopy and colonoscopy, both of which were within     normal limits and there was no defect or any source of bleeding found     throughout her GI tract.  The patient's hemoglobin remained stable after     this two-unit transfusion.   1. Renal transplant.  The patient's creatinine remained stable throughout     hospitalization.  We continued her on her chronic medications from the     transplantation.   1. History of coronary artery disease.  Given that she has an extensive     history of  CAD with multiple re-stenting, cardiology was consulted.  Dr.     Olevia Perches was consulted and agreed to hold the aspirin and Plavix for a     short period of time, however, given that the patient has re-stenosed the     same artery four times, it was urged to have a colonoscopy and endoscopy     performed so that we could resume the aspirin and Plavix as soon as     possible if this could be done.  Therefore, GI did go ahead  and proceed     with an endoscopy and colonoscopy and given that the GI bleed was ruled     out, the patient was again initiated on Plavix and aspirin, however, she     had no chest pain during this admission.   1. Metrorrhagia.  After starting her Plavix and aspirin, the patient has     noticed that she has increased days and increased amount of menses.     Given that she remains stable with her blood transfusion, it was deemed     that the patient was stable enough to undergo outpatient evaluation by     her gynecologist.  The patient mentioned that she would follow up with     them.  Given that she is a very reliable patient, we allowed her to make     her own appointment.   1. Diabetes.  The patient was controlled on her Actos, Glucotrol, and     sliding scale insulin.   1. Chronic obstructive pulmonary disease.  This remained stable throughout     hospitalization.   1. Upper respiratory infection.  The patient had rhinorrhea, minimal cough     and some LAD, however, the patient was never febrile and was able to     tolerate good p.o. so we continued her just on her nebulizer treatment.   1. Urinary tract infection.  This was diagnosed as an outpatient.  We     continued her on Levaquin as this regimen had started previously to     coming here.  Per the patient, this was improving on a day-to-day basis.  On the day of discharge the patient had no complaints.  She was afebrile,  blood pressure was 121/56, pulse 54, respirations 16.  She was in no acute  distress, awake, alert and oriented times four.  Regular rate and rhythm.  Lungs were clear to auscultation with good air movement.  Abdomen was  benign.  She had no clubbing, no cyanosis, and only trace edema.  Her labs  for the day were white blood count of 6.5, hemoglobin 11.6, hematocrit 95,  platelet  count 287,000, sodium 141, potassium 3.5, chloride 105, bicarb 30, glucose 133, BUN 14, creatinine 1.0, LFTs within normal  limits with the exception of  alkaline phosphatase which was 140.  Again, the patient was to follow up as  stated above.                                                Thornell Mule, M.D.    LC/MEDQ  D:  09/06/2003  T:  09/07/2003  Job:  [id number]

## 2011-01-02 NOTE — H&P (Signed)
[place]. [facility]  Patient:    Katherine [name], Katherine [name] Visit Number: [id number] MRN: [id number]          Service Type: MED Location: [id number] Attending Physician:  Sol Blazing Dictated by:   Roney Jaffe, M.D. Admit Date:  08/05/2001                           History and Physical  REASON FOR ADMISSION:  Chills and fever.  HISTORY OF PRESENT ILLNESS:  The patient is a 55 year old white female with a functioning renal transplant, baseline creatinine of 1.2.  Had a recent admission two weeks ago for nausea, vomiting, low-grade fevers, and probable mild pancreatitis which resolved within 48 hours.  She now presents with dysuria, high fever, chills, left lower quadrant transplant pain, bilateral flank pain, and nausea and vomiting.  She developed severe dysuria about two days ago and was seen in the office yesterday and started on Cipro 250 mg, I believe, twice a day for dysuria and "abnormal" urine tests, according to the patient.  She does have a history of recurrent UTIs, usually treated with Cipro or Keflex.  She is followed by Dr. Jeneen Rinks Deterding in our office.  She started her Cipro yesterday; however, today she says her fever has worsened. She is vomiting, including the Cipro pills, and she feels very bad.  She denies shortness of breath, productive cough, headache, diarrhea.  PAST MEDICAL HISTORY:  1. ______ with renal transplant in 1991.  2. CAD, status post CABG in 1995.  3. Obesity.  4. Hyperlipidemia.  5. COPD with sleep apnea, on nightly CPAP.  6. Hypertension.  7. Anxiety.  8. Fibromyalgia.  9. History of atrial fibrillation. 10. History of recurrent sinusitis.  MEDICATIONS:  1. Cipro, started yesterday, 250 b.i.d., I believe.  2. Ativan p.r.n.  3. Glucotrol 5 mg in the morning.  4. Actos 45 q.a.m.  5. Singulair 10 q.d.  6. Pravachol 80 q.h.s.  7. Zoloft 150 q.d.  8. Imdur 30 q.d.  9. Prednisone 7.5 q.d. 10. Imuran 125  q.d. 11. Advair b.i.d. 12. Metoprolol 50 b.i.d. 13. Prevacid 30 q.h.s. 14. Maxair as needed. 15. Nitroglycerin as needed.  ALLERGIES:  SULFA, CODEINE, ERYTHROMYCIN GROUP, and TETRACYCLINE.  SOCIAL HISTORY:  Married.  Not working.  REVIEW OF SYSTEMS:  Noncontributory.  GENERAL:  Denies weight loss, night sweats.  HEENT:  Denies hearing loss, visual changes, sore throat, or difficulty swallowing.  She had a recent episode of thrush on her last admission treated with Mycelex for one week.  RESPIRATORY:  CPAP at night.  No recent respiratory exacerbations.  No tobacco use.  CARDIAC:  She has occasional chest pain with exertion; I would say infrequent. GASTROINTESTINAL:  As above.  GENITOURINARY:  As above.  MUSCULOSKELETAL:  No acute or chronic joint complaints.  NEUROLOGIC:  No history of stroke, TIA, or seizure.  No acute numbness or weakness or focal weakness.  ENDOCRINE:  Blood sugars relatively well controlled at home through oral agents.  PHYSICAL EXAMINATION:  VITAL SIGNS:  Temperature 102, blood pressure 110/70, heart rate 98, respirations 16.  GENERAL:  Alert, obese white female in no distress.  SKIN:  No rash or cyanosis.  HEENT:  Throat clear.  NECK:  Supple.  Without JVD.  CHEST:  Mild scattered expiratory wheezing but mostly clear, with good air movement.  CARDIAC:  Regular rate and rhythm.  Without murmur, rub, or gallop.  ABDOMEN:  Obese, with no masses.  There is significant tenderness over the transplant in the left lower quadrant.  There is no rebound tenderness.  No significant flank tenderness.  EXTREMITIES:  No peripheral edema.  NEUROLOGIC:  Alert and oriented x 3.  Grossly nonfocal.  LABORATORY DATA:  Sodium 138, potassium 3.2, CO2 24, BUN 16, creatinine 1.2, albumin 2.4, SGOT 30, SGPT 25, bilirubin 0.5.  White blood count 6.8, hemoglobin 13, platelets 184.  Urinalysis:  Yellow, cloudy, pH 6.5, large hemoglobin, positive nitrite, and large  leukocyte esterase, too numerous white blood cells to count, many epithelials, 11-20 rbcs, and few bacteria.  Chest x-ray was pending.  IMPRESSION: 1. Fever, chills, dysuria, and tender renal transplant:  Suspected    pyelonephritis. 2. Stable renal transplant function. 3. Recent episode of probable mild pancreatitis:  With normal biliary    ultrasound. 4. Diabetes mellitus:  On Actos and Glucotrol. 5. Chronic obstructive pulmonary disease and obstructive sleep apnea:  On    nightly CPAP. 6. Hypertension:  On Toprol. 7. Anxiety and depression:  On Zoloft.  PLAN:  Admit.  Switch to IV antibiotics.  Await cultures.  Symptomatic treatment of vomiting and pain.  Administer IV fluids. Dictated by:   Roney Jaffe, M.D. Attending Physician:  Sol Blazing DD:  08/05/01 TD:  08/06/01 Job: [id number] ID:[id number]

## 2011-01-02 NOTE — Assessment & Plan Note (Signed)
[facility]                             PULMONARY OFFICE NOTE   JAQUAY, SLIMAK                     MRN:          [id number]  DATE:10/29/2006                            DOB:          1955-09-17    This is a very pleasant 55 year old white female who presents for an  acute visit due to increased difficulties with nasal drainage which is  mucopurulent in nature.  This started approximately 2 days prior to this  evaluation.  I last saw [place] exactly a year ago and she has had no  difficulty since.  She has difficulties with chronic sinusitis.  She  also has difficulties with asthmatic bronchitis, gastroesophageal  reflux, and obstructive sleep apnea for which she is on C-PAP and oxygen  therapy.  She is status post renal transplantation in the past for which  she is on prednisone daily.   The patient denies any fevers, chills or sweats, but again has had the  difficulties with a recurrent sinusitis.  Overall she has done extremely  well over the past year and has had no flareups in between.   Current medications as noted on the intake sheet.  These have been  reviewed and are accurate.   PHYSICAL EXAMINATION:  Vital signs noted.  Oxygen saturation was 94% on  room air.  IN GENERAL:  This is a well-developed, moderately obese female who is in  no acute distress.  HEENT:  Turbinate edema bilaterally.  She does have purulent nasal  drainage.  NECK:  Supple, no adenopathy noted.  No JVD.  LUNGS:  Clear.  CARDIAC:  Regular rate and rhythm, no murmurs, rubs or gallops.  EXTREMITIES:  No cyanosis, clubbing or edema noted.   IMPRESSION:  1. Acute on chronic sinusitis.  2. Asthmatic bronchitis and obstructive sleep apnea, these very well      compensated.   PLAN:  1. Will be for the patient to be treated with Avalox 400 mg daily x10      days.  We gave her a 3 days worth of samples as well as      prescription.  2. Continue medications as they  are.  3. I have set for [facility] to follow up in 3-4 months' time.  I would      like for her to follow up with my partner, Dr. Danton Sewer, as I      will be leaving the practice.  I advised Jamesha of this fact.  She      is to contact us prior to her the above appointment should any new      problems arise.    Renold Don, MD  Electronically Signed   CLG/MedQ  DD: 10/29/2006  DT: 10/31/2006  Job #: [id number]   cc:   Jeneen Rinks L. Deterding, M.D.

## 2011-01-02 NOTE — Cardiovascular Report (Signed)
[place]. [facility]  Patient:    Katherine [name], Katherine [name]                     MRN: [id number] Proc. Date: 12/25/99 Adm. Date:  [id number] Attending:  Epifanio Lesches                        Cardiac Catheterization  PROCEDURES: 1. Left heart catheterization. 2. Coronary angiography. 3. Saphenous vein graft. 4. Left internal mammary artery angiography. 5. Saphenous vein graft to diagonal-placement of intracoronary stent.  SURGEON:  Alla German, M.D.  COMPLICATIONS:  None.  INDICATION:  Ms. Bruton is a 55 year old white female, patient of Dr. Nanine Means. [name] and Dr. Joyice Faster. Deterding, with a history of CAD, status post coronary artery bypass surgery in 1995 consisting of a LIMA to LAD, vein graft to diagonal, and vein graft to distal RCA.  Additionally, the patient has a history of renal failure and is status post a renal transplant in 1991 followed by Dr. Jeneen Rinks L. Deterding.  She was admitted on Dec 24, 1999 complaining of substernal chest pain radiating to both arms which felt similar to her pain prior to her bypass surgery.  This was discussed with her length regarding the risks of cardiac catheterization given her renal transplant; however, it is felt that this probably represented angina and she was subsequently referred for cardiac catheterization.  DESCRIPTION OF PROCEDURE:  After giving informed consent, the patient was brought to the cardiac catheterization and her right and left groin shaved, prepped and draped in the usual fashion.  ECG monitoring was established.  Using modified Seldinger technique, a 6 French arterial sheath was inserted in the right femoral artery.  A 6 French diagnostic catheter was then used to perform diagnostic angiography.  This revealed a medium size left main with no significant disease.  The LAD is a medium size vessel which is noted to be totally occluded in its midportion.  There is a 70% ostial lesion  and a 50% early proximal stenosis prior to the total occlusion.  The mid and distal LAD filled via patent left internal mammary artery which has no significant disease in the body of the graft or distal to the IMA insertion.  There is a first diagonal which fills via a saphenous vein graft.  The vein graft is noted to be irregular in its distal portion with a 95% stenotic lesion just prior to the touch down of the first diagonal.  The diagonal was a medium size vessel which bifurcates in its midportion.  There is a 50% stenotic lesion in the upper branch of the bifurcation.  The left circumflex is a medium size vessel which courses in the AV groove and gave rise to two large obtuse marginal branches.  The AV groove circumflex has no significant disease.  The first OM is a large vessel which bifurcates distally and has no significant disease.  The second OM is a medium size vessel with a 60% ostial lesion and a 30% lesion prior to a bifurcation.  The right coronary artery is a medium sized vessel which is totally occluded in its midportion.  The distal portion of the RCA as well as the PDA and posterolateral branch fill via a patent saphenous vein graft to the distal RCA.  The body of the vein graft has no significant disease.  There is no significant disease in the distal RCA, PDA, or posterolateral branch.  No left ventriculogram was performed secondary to renal transplant.  Hemodynamic showed systemic arterial pressure 158/98.  INTERVENTIONAL PROCEDURE:  Saphenous vein graft to diagonal-distal:  Following diagnostic angiography, a 7 French arterial sheath was inserted in the right femoral artery and a 7 French LCB guiding catheter was then engaged in the vein graft to the diagonal.  Next, a 0.014 exchange link Patriot guide wire was advanced through the guiding catheter into the proximal vein graft.  The guide wire was then used to cross the distal stenotic lesion positioned in  the distal diagonal without difficulty.  Next, a Medtronic S660, 2.5 by 9 mm coronary stent was then tracked over the guide wire and then positioned across the stenotic lesion with careful attention not to jail the upper and lower branch of the diagonal.  The stent was then deployed to a maximum of 16 atmospheres for a total of one minute.  A follow-up angiogramed revealed no evidence of dissection or thrombus with TIMI III flow of the distal vessel.  Intravenous dose of heparin was given to maintain the ACT around 300.  Final orthogonal angiograms reveal less than 10% residual stenosis in the anastomotic site of the vein graft to the diagonal with no evidence of dissection or thrombus.  There is TIMI III flow through of the distal vessel.  At this point, it was decided to conclude the procedure.  All balloons, wires, and catheters removed.  Hemostatic sheath was then placed and the patient transferred back to the ward in stable condition.  CONCLUSIONS: 1. Successful primary stenting of the saphenous vein graft to the diagonal    insertion using a Medtronic S660, 2.5 by 9 mm. 2. Patent left internal mammary artery to the left anterior descending    coronary artery with no significant disease in the body of the graft or    in the left anterior descending coronary artery distal to the graft    insertion. 3. Patent saphenous vein graft to the right coronary artery with no disease in    the body of the graft or distal to the graft. 4. Systemic hypertension. DD:  12/25/99 TD:  12/27/99 Job: [id number]

## 2011-01-02 NOTE — Discharge Summary (Signed)
. [facility]  Patient:    Katherine [name], Katherine [name] Visit Number: [id number] MRN: [id number]          Service Type: MED Location: [phone] Attending Physician:  Sol Blazing Dictated by:   Maia Plan, P.A. Admit Date:  08/05/2001 Discharge Date: 08/07/2001   CC:         [facility]   Discharge Summary  ADMISSION DIAGNOSES: 1. Fever, chills, dysuria, and tender renal transplant left lower quadrant. 2. Cadaveric renal transplant. 3. Chronic obstructive pulmonary disease and obstructive sleep apnea. 4. Hypertension. 5. Diabetes mellitus. 6. Anxiety and depression. 7. Recent pancreatitis.  DISCHARGE DIAGNOSES: 1. Probable pyelonephritis 10,000 colonies of mixed species and urine culture. 2. Status post cadaveric renal transplant, stable function. 3. Diabetes mellitus. 4. Chronic obstructive pulmonary disease with obstructive sleep apnea. 5. Hypertension. 6. Anxiety and depression. 7. Recent pancreatitis.  BRIEF HISTORY:  A 55 year old white female with functioning renal transplant. Baseline creatinine of 1.2.  She had a recent admission two weeks ago with nausea, vomiting, low grade fevers, and probable mild pancreatitis which resolved within 48 hours.  She now presents with dysuria, high fever, chills, left lower quadrant transplant pain, bilateral flank pain, nausea and vomiting.  She developed severe dysuria about two days ago, was seen in the office one day prior to admission and was started on Cipro 250 mg.  The patient states the dosing is twice a day, for "abnormal" urine tests.  She does have a history of recurrent UTIs usually treated with Cipro or Keflex. She denies shortness of breath, productive cough, headache, or diarrhea and presents to the ER due to worsening symptoms.  LABS ON ADMISSION:  Sodium 138, potassium 3.2, chloride, CO2, BUN 16, creatinine 1.2, albumin 2.4, SGOT 30, SGPT 25, bilirubin 0.5.  White  count 6800, hemoglobin 13, platelets 184,000.  Urinalysis is yellow, cloudy urine with a pH of 6.5 with large amount of hemoglobin, leukocyte esterase and too numerous to count white blood cells.  It is also positive for nitrites and 11 to 20 red blood cells per high powered field.  HOSPITAL COURSE:  Chest x-ray was taken which showed no active lung disease which is positive for cardiomegaly, which is stable from chest x-ray in March of 2002.  Blood cultures were obtained and she was placed on IV Cipro 400 mg q.12h.  While still in the emergency room, she did get one dose of Rocephin. Over the next 24 hours trended downward.  Her white cells peaked at 15,200 with 91 segs but decreased to 9300 on day of discharge.  Her urine grew out 10,000 colonies of mixed species. Blood cultures remained negative.  Her allograft tenderness decreased.  She was switched to oral Cipro at 500 mg twice a day.  Her presentation is suspicious for pyelonephritis despite unrevealing cultures.  Her immunosuppressant therapy was continued as per outpatient dosing. Her renal function remained stable with creatinine to 1.2. On day of discharge, the patient was very anxious to go home to care for her terminally ill mother.  She was discharged with specific instructions to monitor her temperatures twice a day and to call if her fever persisted. She was given Cipro for 10 more days, 500 mg twice a day.  Her other medications went unchanged.  DISCHARGE MEDICATIONS:  1. Glucotrol 5 mg q.a.m.  2. Prednisone 7.5 mg daily.  3. Imuran 125 mg daily.  4. Actos 45 mg q.a.m.  5. Advair 500/50 Diskus  one puff b.i.d.  6. Singulair 10 mg one daily.  7. Lopressor 50 mg b.i.d.  8. Imdur 30 mg daily.  9. Zoloft 150 mg daily. 10. Pravachol 80 mg with supper. 11. Prevacid 30 mg q.h.s. 12. Cipro 500 mg b.i.d. Dictated by:   Maia Plan, P.A. Attending Physician:  Sol Blazing DD:  09/22/01 TD:  09/23/01 Job:  [id number]

## 2011-01-02 NOTE — Cardiovascular Report (Signed)
[place]  Patient:    Katherine [name], Katherine [name]                     MRN: [id number] Proc. Date: 10/28/00 Adm. Date:  [id number] Attending:  Epifanio Lesches CC:         Joyice Faster. Deterding, M.D.  CP lab  Lorretta Harp, M.D.   Cardiac Catheterization  PROCEDURE:  Retrograde central aortic catheterization, selective coronary angiography by Judkins technique, saphenous vein graft angiography, selective left internal mammary artery graft angiography, Aggrastat bolus plus infusion, tandem primary stenting saphenous vein graft - diagonal high grade stenosis beyond previously placed stent of Dec 25, 1999, Aggrastat bolus plus infusion, weight-adjusted heparin, Plavix 150 p.o.  DESCRIPTION OF PROCEDURE: Patient brought to the second floor cardiopulmonary laboratory in the postabsorptive state after 5 mg of Valium p.o. premedication.  The right groin was prepped and draped in the usual manner. Lidocaine 1% was used as local anesthesia.  Despite her marked obesity, we were fortunately able to enter the RFA with a single anterior puncture using an 18 thin wall needle.  A 6-French short Daig side-arm sheath was inserted without difficulty.  Diagnostic coronary angiography was done through a 6-French/4 cm taper preformed coronary and pigtail catheters.  The right coronary catheter was used for selective saphenous vein graft angiography to the RCA and to the diagonal and selective LIMA injection.  LV angiogram is done in the RAO projection at 20 cc, 14 cc per second, single injection. Pullback pressure of the CA was performed and showed no gradient across the aortic valve.  PRESSURES:  LV: 148/0;  LVEDP 148/87 mmHg.  There was no gradient across the aortic valve on catheter pullback.  LVEDP = 16-18 mmHg.  LV angiogram in the RAO projection demonstrated hypo-akinesis of the mid anterolateral wall in the diagonal distribution.  There was hypo-akinesis  of the very apical segment and hypo-akinesis of the basilar third of the inferior wall.  There was no mitral regurgitation.  Estimated EF was greater than 50%.  The left subclavian had calcification near the ostia, but it was widely patent.  The left vertebral was antegrade.  Fluoroscopy showed marked mitral annular calcification.  The main left coronary artery was normal.  The left anterior descending was totally occluded in its proximal third just after a septal perforator branch. There was generalized 60-70% narrowing of the proximal portion of the LAD up to the diagonal.  The circumflex artery showed a smooth 50% narrowing of the large first marginal branch, which trifurcated distally and had normal flow.  The circumflex proper was widely patent with no significant stenosis.  The second marginal branch bifurcated and had no significant stenosis and distal circumflex bifurcated without significant stenosis.  There was a PAVG branch that was normal.  The right coronary was totally occluded after a large RV branch.  There was no antegrade filling and no retrograde flow from the left coronary system.  Saphenous vein graft to the RCA showed an excellent graft with mild irregularities in the proximal near the ostia, but no significant stenosis and excellent anastomosis to the distal RCA.  There was retrograde filling to the acute margin and excellent antegrade filling of the PLA and PDA that had no significant stenosis.  The LIMA had an excellent anastomosis to the proximal-mid third of the LAD with excellent antegrade flow.  There was no significant LAD stenosis and it coursed to the apex of the heart and  gave off a small diagonal in the mid portion.  The saphenous vein graft to the moderately large diagonal branch was widely patent.  The stent in the distal third (AVE 2.5/9 S 660 placed by Dr. [name] Dec 25, 1999) had no significant stenosis, however, there was 80-90%  hypodense stenosis just beyond the last stent strut in the native diagonal beyond the graft insertion.  The diagonal then bifurcated into two branches.  The superior branch had no significant stenosis and the inferior branch had 50% segmental narrowing that was smooth.  There was good TIMI-3 flow and no evidence of thrombus.  This patient has a very complex history, which will be outlined in the diagnoses.  Her last intervention was stenting of her distal diagonal graft on Dec 25, 1999 by Dr. [name] with an excellent result with an [place] 2.5/9 S.  Has a patent LIMA to the LAD, excellent patent graft to the distal right and mild disease of the ______ of the circumflex.  There is no significant stenosis within the stent, but does have a lesion just beyond this.  It was felt best to proceed with intervention at this time with the patients history of chest pain compatible with ischemia and a recent positive Cardiolite showing mild lateral ischemia compatible with this distribution.  Patient is status post renal transplant in [facility] at [facility], has been quite stable and her creatinine preoperatively was 1.4 with a BUN of 20.  Informed consent was obtained to proceed.  The patient was given weight-adjusted heparin of 5000 units and started on Aggrastat bolus plus infusion.  The SVG to the diagonal was intubated with a JR-4/6-French guiding catheter.  The lesion was crossed with an HDF 0.014 inch wire.  An ACS Pixel 2.5/8 coronary stent was passed through the previously placed stent and it was underlapping this and covering the lesion on test injection.  It was deployed at 13 atmospheres for 50 seconds with full expansion.  The balloon was pulled back and final injection showed excellent result covering this small area of stenosis beyond the previous stent and tapering to the distal size of the distal vessel.  There was good TIMI-3 flow  to both bifurcation branches of the moderate sized diagonal.   The dilatation system was removed, side-arm sheath was flushed and the patient was transferred to the holding area for postoperative care, sheath removal when ACT normalizes.  Heparin will be discontinued.  She should be continued on 2B3A inhibitor over for 18 hours and aspirin and Plavix.  A 150 of Plavix was given in the laboratory.  CATHETERIZATION DIAGNOSES:  1. 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  2. Recurrent angina 5-1/2 months with left anterior descending restenosis and     percutaneous transluminal coronary angioplasty on [place] protocol Dec 24, 1997 Dec 24, 1993.  Associated diagonal 85% percutaneous transluminal     coronary angioplasty and right coronary artery percutaneous transluminal     coronary angioplasty.  3. Progression of disease with multi-vessel coronary artery bypass grafting     by Dr. Servando Snare February 19, 1994 x 3.  4. Recurrent angina with successful primary stenting saphenous vein graft to     diagonal Dec 25, 1999.  5. Recurrent angina with positive Cardiolite and new  lesion beyond previously     placed stent with positive with successful primary underlapping stent     October 28, 2000; segmental wall motion abnormality with ejection fraction     greater than 50%.       A. Patent left internal mammary artery to left anterior descending.       B. Patent saphenous vein graft to right coronary artery.       C. Mild obtuse marginal one disease of native circumflex.  6. Exogenous obesity.  7. Cigarette abuse continued.  8. Hyperlipidemia.  9. Systemic hypertension. 10. Prior renal transplant 1991 [facility]. 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 12. Chronic obstructive pulmonary disease, chronic bronchitis, asthmatic     component. DD:   10/28/00 TD:  10/28/00 Job: [id number]

## 2011-01-02 NOTE — Op Note (Signed)
NAME:  Katherine [name], Katherine [name]                     ACCOUNT NO.:  [id number]   MEDICAL RECORD NO.:  [id number]                   PATIENT TYPE:  INP   LOCATION:  5503                                 FACILITY:  [facility]   PHYSICIAN:  Wonda Horner, M.D.                DATE OF BIRTH:  [birth year]   DATE OF PROCEDURE:  07/03/2003  DATE OF DISCHARGE:                                 OPERATIVE REPORT   PROCEDURE PERFORMED:  Colonoscopy with biopsy.   INDICATIONS FOR PROCEDURE:  Heme positive stool, anemia.  Family history of  colon cancer.   Informed consent was obtained after explanation of the risks of bleeding,  infection, and perforation.   PREMEDICATIONS:  The procedure was done immediately after an  esophagogastroduodenoscopy with an additional 20 mcg of fentanyl and 2 mg of  Versed given.   DESCRIPTION OF PROCEDURE:  With the patient in the left lateral decubitus  position, a rectal exam was performed and no masses were felt.  The Olympus  colonoscope was inserted into the rectum and advanced around the colon to  the cecum.  Cecal landmarks were identified.  The cecum and ascending colon  were normal.  The transverse colon was normal.  The descending colon  revealed a small 3 to 4 mm sessile polyp which was removed with cold  forceps.  The sigmoid and rectum were normal.  The patient tolerated the  procedure well without complications.   IMPRESSION:  Small polyp in the descending colon which was removed.   PLAN:  Pathology will be checked.                                               Wonda Horner, M.D.    Katherine [name]  D:  07/03/2003  T:  07/04/2003  Job:  [id number]   cc:   Sol Blazing, M.D.  Fax: [phone]

## 2011-01-02 NOTE — Discharge Summary (Signed)
[name], [name]                        ACCOUNT NO.:  [id number]   MEDICAL RECORD NO.:  [id number]                   PATIENT TYPE:  INP   LOCATION:  4713                                 FACILITY:  [facility]   PHYSICIAN:  [name], M.D.          DATE OF BIRTH:  Jan 13, 1956   DATE OF ADMISSION:  06/05/2003  DATE OF DISCHARGE:  06/09/2003                                 DISCHARGE SUMMARY   DISCHARGE DIAGNOSES:  1. Unstable angina, status post stenting to the saphenous vein graft of the     diagonal this admission.  This is the fourth time this vessel was     intervened on.  2. History of renal transplant, followed by the renal service.  3. Chronic obstructive pulmonary disease and smoking.  4. Obesity, she is on CPAP.  5. Coronary artery bypass grafting, 1995.   HOSPITAL COURSE:  [name] is a 55 year old female followed by Dr.  [name] and [facility], with chronic renal insufficiency  status post renal transplant.  She had bypass surgery in 1995 and has had  several interventions to the SVG of the diagonal.  The last one was in  August 2003.  She was admitted June 06, 2003, with unstable angina.  Enzymes were negative.  She was put on IV Heparin and seen in consult by the  renal service.  It was decided she would require diagnostic catheterization.  This was done on June 07, 2003.  Diuretics were held.  This revealed  stenosis, 95%, at the diagonal.  This was dilated and stented by Dr. Melvern Banker.  He used a Taxus stent.  She was put on Plavix.  She tolerated this well.  We  feel she can be discharged on July 10, 2003.  Creatinine at discharge is  1.2.   DISCHARGE MEDICATIONS:  1. Plavix 75 mg daily.  2. Prednisone 5 mg daily.  3. Ativan 1 mg twice daily.  4. Sliding scale insulin.  5. Prevacid 30 mg twice daily.  6. Potassium 20 mEq daily.  7. Pyridium 200 mg twice daily.  8. Aspirin 81 mg daily.  9. Glucotrol 15 mg b.i.d.  10.       Lasix 80 mg b.i.d.  11.      Pulmicort and Rhinocort inhalers.  12.      Zoloft 150 mg h.s.  13.      Pravachol 80 mg h.s.  14.      Tricor 108 mg h.s.  15.      Singulair 10 mg h.s.  16.      Actos 45 mg h.s.  17.      Imdur 15 mg h.s.  18.      Lopressor 25 mg b.i.d.  19.      Maxair p.r.n.  20.      Imuran 50 mg 2-1/2 tablets daily.   LABORATORY DATA:  The EKG shows sinus rhythm, inferior Q-waves, nonspecific  ST changes with lateral T-wave flattening.  Chest x-ray shows no acute  disease on the 20th, it does note she is post bypass.  White count 8.1,  hemoglobin 11, hematocrit 32.9, INR 0.9.  Sodium 138, potassium 4.1, BUN 27,  creatinine 1.2.  CK-MB and troponins are negative x3.  Urinalysis did show  nitrates and greater than 100,000 Escherichia coli resistant to  Levofloxican.  It should be noted she was given a prescription for Augmentin  at discharge, by the renal service.   DISPOSITION:  The patient is discharged in stable condition and will follow  up with Dr. [name] on July 05, 2003, at 10:45.  Dr. Melvern Banker notes this  was the fourth intervention to the SVG of the diagonal.      Katherine [name], P.A.                      [name], M.D.    Meryl Dare  D:  07/11/2003  T:  07/12/2003  Job:  [id number]   cc:   [facility]

## 2011-01-02 NOTE — Discharge Summary (Signed)
Katherine [name], Katherine [name]              ACCOUNT NO.:  [id number]   MEDICAL RECORD NO.:  [id number]          PATIENT TYPE:  INP   LOCATION:  [id number]                         FACILITY:  [place]   PHYSICIAN:  Sol Blazing, M.D.DATE OF BIRTH:  [birth year]   DATE OF ADMISSION:  02/08/2005  DATE OF DISCHARGE:  02/11/2005                                 DISCHARGE SUMMARY   DISCHARGE DIAGNOSES:  1.  Viral syndrome.  2.  Status post renal transplant.  3.  Insulin-dependent diabetes mellitus.  4.  Anemia of chronic disease.   PROCEDURES:  1.  February 08, 2005, abdominal ultrasound:  Impression is 1) negative for      gallstones, 2) mild hydronephrosis of the transplant kidney with overall      improvement since the prior ultrasound.  2.  February 09, 2005, head CT without contrast:  Impression is 1) no sign of      intracranial hemorrhage or other acute process, 2) small areas of low      density in the right posterior frontal white mater consistent with small      vessel insult. The age is indeterminate.   HISTORY OF PRESENT ILLNESS:  Katherine [name] is a 55 year old white female with  renal transplant, diabetes, COPD, coronary artery disease, obstructive sleep  apnea who presents to the ER with complaints of fever and chills onset of  approximately three days. She complains of malaise, nausea with midthoracic  back pain (on and off a couple of weeks). She is currently on day 8 out of  10 of Levaquin for upper respiratory infection with head and chest  congestion. She had productive cough with green phlegm and now is dry. She  complains of dysuria a couple of weeks with specimen sent but lost at lab  approximately one week ago. She denies vomiting, diarrhea, abdominal pain,  or allograft pain.   ADMISSION LABS:  Sodium 140, potassium 4.5, BUN 14, creatinine 1.5, glucose  93. White blood cell count 12.7, platelets 274,000.  Admission chest x-ray  stable scarring left mid lung, otherwise negative.   HOSPITAL COURSE:  Problem 1:  VIRAL SYNDROME:  The patient underwent workup  for fever to include ultrasound which was negative. Urinalysis was negative.  Chest x-ray with the results above. Liver function testing was normal and  blood cultures were normal. Because of these findings, the patient was  thought to have a viral syndrome. She was treated with supportive care  including a change from Phenergan to Zofran which helped. She also had an  addition of Reglan. Advancing diet slowly over the last couple of days of  admission, the patient's symptoms did improve greatly. She did have a  headache during this admission and a CT was performed and was negative. The  patient was much improved at time of discharge.   Problem 2:  STATUS POST RENAL TRANSPLANT:  The patient remained on her usual  outpatient medications. Her serum creatinine did remain stable through this  admission. There was no interventions.   Problem 3:  INSULIN-DEPENDENT DIABETES MELLITUS:  The patient remained on  her usual  outpatient medications and remained on sliding scale insulin only  while NPO.   Problem 4:  ANEMIA OF CHRONIC DISEASE:  This was not an issue at all. The  patient's hemoglobin was very stable and greater than 13.   DISCHARGE MEDICATIONS:  1.  Phenergan 12.5 milligrams one p.o. q.8h. p.r.n. nausea and vomiting.  2.  Reglan 10 milligrams p.o. t.i.d. a.c. and h.s.  3.  Resume home medications as prior to discharge.   DISCHARGE INSTRUCTIONS:  The patient will be discharged home. She is to take  it easy. She will follow up with Dr. Jimmy Footman in the office on her usual  scheduled appointment. She will need to continue on a low-salt diet.      Leafy Kindle, [place]      Sol Blazing, M.D.  Electronically Signed    MY/MEDQ  D:  04/08/2005  T:  04/09/2005  Job:  [id number]

## 2011-01-02 NOTE — Op Note (Signed)
NAMERENOTA, ABBETT                        ACCOUNT NO.:  [id number]   MEDICAL RECORD NO.:  [id number]                   PATIENT TYPE:  OBV   LOCATION:  [id number]                                 FACILITY:  [facility]   PHYSICIAN:  [name]. Hoxworth, M.D.          DATE OF BIRTH:  1955/09/02   DATE OF PROCEDURE:  04/29/2004  DATE OF DISCHARGE:                                 OPERATIVE REPORT   PREOPERATIVE DIAGNOSIS:  Morbid obesity.   POSTOPERATIVE DIAGNOSIS:  Morbid obesity.   SURGICAL PROCEDURE:  Laparoscopic gastric banding.   SURGEON:  Dr. Excell Seltzer   ASSISTANT:  Dr. Alphonsa Overall   ANESTHESIA:  General.   BRIEF HISTORY:  Katherine [name] is a 55 year old female with morbid obesity  unresponsive to multiple attempts at medical management and multiple  significant comorbidities including coronary artery disease, hypertension,  sleep apnea, asthma, bronchitis, hypercholesterolemia, diabetes, and DJD.  She has also had a renal transplant for end-stage renal disease on chronic  immunosuppression.  After discussion of surgical options for treatment of  her morbid obesity, we elected to proceed with laparoscopic gastric banding.  The nature of the procedure, indications, risks, were discussed extensively  and detailed elsewhere.  She is now brought to the operating room for this  procedure.   DESCRIPTION OF OPERATION:  The patient was brought to the operating room,  placed in the supine position on the operating table, and general  endotracheal anesthesia was induced.  She was given broad-spectrum  preoperative antibiotics.  The abdomen was widely sterilely prepped and  draped.  Due to the patient's previous peritoneal dialysis history, I  elected to access the abdomen with an Hasson technique.  A 1.5 cm incision  was made in the left flank and dissection carried down to the anterior  fascia which was incised for 1 cm.  The internal oblique was bluntly split  and the peritoneum  elevated and entered, and actually there were no  significant adhesions to the anterior abdominal wall in this area.  Through  a mattress suture of 0 Vicryl, the Hasson trocar was placed and  pneumoperitoneum established.  Fortunately, there were really no adhesions  to the anterior abdominal wall.  Under direct vision, a 12 mm trocar was  placed in the right upper quadrant through the falciform ligament.  Another  12 mm trocar in the right upper abdomen at the site of a planned port  placement and an 11 mm trocar in the left subcostal space.  The liver was  noted to be significantly enlarged, extending about half-way from the  xiphoid of the umbilicus and quite thick.  Through a 5 mm epigastric port,  the Nathanson retractor was placed and carefully placed under the left lobe  of the liver.  It took some manipulating to get this retractor where we  could visualize the hiatus, but this was accomplished with some moderate  difficulty due to the size of the  liver.  To aid in retraction, an  additional 5 mm port was placed further in the left flank.  The stomach and  hiatal fat was pulled down in the angle of His and the EG junction  adequately exposed.  Hook cautery was used to incise the peritoneum at the  angle of His.  The anatomy was a little difficult due to the marked amount  of fat around the EG junction but with careful blunt dissection to the left  of the esophagus, the left crus was identified.  There was splenomegaly, but  this was not a problem for the dissection.  Dissection was carried down  bluntly along the left crus posterior to the stomach.  Following this, the  gastrohepatic omentum was divided along an avascular plane and the caudate  lobe lifted.  Again, there was a lot of fat in the retroperitoneum which  made identification of the right crus a little bit difficult and with  careful blunt dissection through this fat, the right crus was identified and  its right border at  the base clearly demarcated.  At this point, using  careful blunt dissection, the finger retractor was placed behind the right  crus.  It was then passed directly up toward the angle of His and passed  very smoothly without any force whatsoever.  The tip was extended and came  out very nicely along our dissected plane on top of the left crus.  At this  point, using an introducer, the band was placed via the right mid abdominal  trocar site and the trocar replaced.  The tubing was passed through; the  finger retractor was then withdrawn back to the lesser curve, and the tubing  and band pulled around the stomach without difficulty.  The tubing was  inserted through the band and with the calibration tube passed down into the  stomach, the band was secured and snapped into place.  The calibration tube  was removed.  The fundus, beginning at the angle of His, was then imbricated  over the top of the band to the gastric pouch with three interrupted 2-0  Prolene sutures.  The band lay nicely with good orientation.  Following  this, the [facility] retractor was removed.  The tubing end was grasped and  brought out through the right mid abdominal port site and then all CO2  evacuated and trocars removed.  The skin incision at this port site was  extended at 1 cm laterally, and the anterior fascia was cleared lateral to  the port site.  The port was then sutured to the anterior fascia with four  interrupted 2-0 Prolene sutures.  The tubing was attached to the port and  reintroduced back to the abdomen and then the port secured to the anterior  abdominal wall, assuring that the tubing made a smooth turn into the  abdominal cavity.  This resulted in the port lying up about 2.5 cm lateral  to the lateral edge of the incision and just slightly inferior to it.  Following this, the subcu at the port site was closed with running 2-0 Vicryl, and then skin incisions were closed with staples.  Sponge, needle,   and instrument counts were correct.  Dry sterile dressings were applied and  the patient taken to recovery in good condition.  [name]. Hoxworth, M.D.    [place]  D:  04/29/2004  T:  04/29/2004  Job:  [id number]

## 2011-01-02 NOTE — Op Note (Signed)
[name], Katherine                        ACCOUNT NO.:  [id number]   MEDICAL RECORD NO.:  [id number]                   PATIENT TYPE:  INP   LOCATION:  5523                                 FACILITY:  [facility]   PHYSICIAN:  Legrand Como B. [name], M.D. [facility]           DATE OF BIRTH:  Jan 10, 1956   DATE OF PROCEDURE:  04/27/2002  DATE OF DISCHARGE:                                 OPERATIVE REPORT   PROCEDURE:  Fiberoptic bronchoscopy with transbronchial biopsy of the right  upper lobe.   INDICATIONS:  This is a 55 year old obese female with chronic rhinitis and  cough, immunosuppressed for chronic renal transplant, now with superimposed  diffuse infiltrates in the right lung associated with persistent fever and a  history of purulent sputum that so far has yielded nothing but inadequate  specimen in the microbiology lab.  For this reason I felt that because she  was immunosuppressed and has persistent fever on antibiotics, I have  recommended bronchoscopy with transbronchial biopsy to rule out  opportunistic infections, and she agreed to the procedure after full  discussion of the risks, benefits, and alternatives.   DESCRIPTION OF PROCEDURE:  The procedure was performed in the endoscopy  suite with continuous monitoring by surface ECG and oximetry.  She received  a total of 5 mg IV Versed and 50 mg IV Demerol for adequate sedation and  cough suppression.   The right naris and oropharynx were liberally anesthetized with 10%  lidocaine spray.  The right naris was additionally prepared with lidocaine  jelly.   Using a standard flexible fiberoptic bronchoscope, the right naris was  easily cannulated with good visualization of the entire oropharynx and  larynx.  The cords moved normally, and there were no apparent upper airway  lesions.  Using additional 1% lidocaine as needed, the entire  tracheobronchial tree was explored bilaterally with the following findings:   There was diffuse airway  edema with mucopurulent secretions present  throughout the major airways.  There was evidence of severe chronic  bronchitic change; however, there were no focal endobronchial abnormalities.   PROCEDURE:  Using a wedge position within the right upper lobe apical  segment, multiple transbronchial biopsies were obtained with adequate  specimens.  The right upper lobe was also vigorously lavaged.   IMPRESSION:  Pulmonary infiltrates, unclear etiology.    RECOMMENDATIONS:  Await transbronchial biopsy for histology and special  stains if indicated, and bronchial washings for cytology, AFB and fungal  stain and culture as well as routine stain and culture and PCP staining.   The patient tolerated the procedure well.  Follow-up chest x-ray was pending  at the time of this dictation.  [name]. [name], M.D. [facility]    MBW/MEDQ  D:  04/27/2002  T:  04/28/2002  Job:  [id number]   cc:   Sol Blazing, M.D.

## 2011-01-02 NOTE — Discharge Summary (Signed)
[facility]  Patient:    Katherine [name]                      MRN: [id number] Adm. Date:  [id number] Disc. Date: 07/29/99 Attending:  Franchot Erichsen Dictator:   Myriam Jacobson, P.A.C. CC:         Windy Kalata, M.D.                           Discharge Summary  DISCHARGE DIAGNOSES: 1. Nausea, vomiting and diarrhea, presumed secondary to viral gastroenteritis. 2. Status post cadaveric renal transplant in 1991. 3. Coronary artery disease, status post coronary artery bypass graft in 1995. 4. History of asthma. 5. Hyperlipidemia. 6. Depression.  HISTORY OF PRESENT ILLNESS:  Katherine [name] is a 55 year old white female with  history of cadaveric renal transplant in 1991 who was in her usual state of health until approximately 7:30 to 8 p.m. the night prior when she developed sudden onset of nausea, vomiting and diarrhea.  Patient states she had eaten a ham and cheese sandwich at [facility] approximately an hour prior to the development of symptoms.  She presented in the emergency room, was treated with Phenergan without much relief; however her nausea improved greatly with Zofran.  She had multiple stools in the emergency room but after the Zofran was given, she had no further stools.  Patient had some flu-like symptoms approximately one month ago which did resolve and she was placed on Ceftin at that time.  She also received a flu shot about wo weeks ago.  Since her flu-type symptoms, she has had a cough which is mostly dry and started on Ceftin approximately three days prior to admission by Dr. Jimmy Footman. She did not have a chest x-ray.  Patient also complained of some mild chest pain which she does not think is her usual cardiac pain as well as some pain in both  arms which is similar to the pain for which she was admitted in 1997 which did ot turn out to be cardiac in origin.  The remainder of the physical examination  is  outlined in the admission history and physical.  She was admitted by Dr. Fleet Contras.  HOSPITAL COURSE:  Admission labs were drawn in the emergency room.  Sodium was 40, potassium 3.7, chloride 103, CO2 20, glucose 176, BUN 18, creatinine 1.2. Total bilirubin was 1.7, alkaline phosphatase 95, SGOT 56, SGPT 24.  Total protein 7.  Albumin 3.5.  Calcium 9.1.  Urinalysis was unremarkable.  Alcohol level was less than 10.  Stool was checked and found to be negative for C. difficile.  Patient  also had multiple stool studies done, all showed no growth at time of discharge and blood cultures were done which also showed no growth.  White count was 8.4, hematocrit 39.3, hemoglobin 12.3.  Patient was initially made n.p.o.  She was placed on IV fluids including IV Pepcid, combivent inhaler and IV Phenergan. She was started on Flagyl once cultures were obtained.  Chest x-ray showed cardiomegaly with mild accentuated bronchial markings, no infiltrate or edematous changes. KG which showed normal sinus rhythm, no acute changes, inferior infarct - age indeterminate.  She was also continued on her usual Imuran and prednisone. Within 24 hours the patient had improved.  She was able to be changed to a no-added salt diet and her medications were changed to oral where possible.  Blood  pressure was back up and she was started on Lopressor b.i.d.  She was also resumed on her Zoloft 75 mg q.d. which was initially started by Dr. [name], her cardiologist. Patient continued to have low grade fever spikes even though cultures were negative. She was kept an additional 24 hours and was able to be discharged home on the morning of December 12.  CONDITION ON DISCHARGE:  Improved.  DISCHARGE MEDICATIONS:  1. Imuran 125 mg q.d.  2. Lopressor 50 mg one-half tablet b.i.d.  3. Prednisone 10 mg q.d.  4. Flagyl 500 mg b.i.d. x 4 more days.  5. Prevacid 15 mg q.d.  6. Zoloft 75 mg q.d.  7.  Ceftin, finish prescription that she has at home.  8. Resume Pravachol 80 mg at bedtime.  9. Enteric-coated aspirin one tablet per day. 10. Combivent inhaler two puffs q.i.d.  DISCHARGE DIET:  No added salt, low fat, low cholesterol diet.  FOLLOWUP INSTRUCTIONS:  Follow up with Dr. Jimmy Footman for routine visit.  If symptoms do not continue to improve or she has recurrent nausea, vomiting and diarrhea, patient understands to call the office of [employer]. DD:  07/29/99 TD:  07/29/99 Job: [id number]

## 2011-01-02 NOTE — Discharge Summary (Signed)
[name], Katherine [name]              ACCOUNT NO.:  [id number]   MEDICAL RECORD NO.:  [id number]          PATIENT TYPE:  INP   LOCATION:  2023                         FACILITY:  [facility]   PHYSICIAN:  Sherril Croon, M.D.   DATE OF BIRTH:  May 20, 1956   DATE OF ADMISSION:  06/22/2005  DATE OF DISCHARGE:  06/27/2005                                 DISCHARGE SUMMARY   ADDENDUM:  In working up this patient's generalized weakness, fevers, and leukocytosis,  a Sandy PCR was performed as she is a transplant patient on  immunosuppressants. 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 With the CMV DNA being  negative it is not thought that this is any contribution to her overall  illness.      Baxter Kail, M.D.      Sherril Croon, M.D.  Electronically Signed    DC/MEDQ  D:  06/29/2005  T:  06/30/2005  Job:  [id number]   cc:   Sherril Croon, M.D.  Fax: [phone]   Joyice Faster. Deterding, M.D.  Fax: [phone]

## 2011-01-02 NOTE — Op Note (Signed)
Katherine [name], Katherine [name]                        ACCOUNT NO.:  [id number]   MEDICAL RECORD NO.:  [id number]                   PATIENT TYPE:  INP   LOCATION:  5523                                 FACILITY:  [facility]   PHYSICIAN:  Cleotis Nipper, M.D.             DATE OF BIRTH:  June 09, 1956   DATE OF PROCEDURE:  04/26/2002  DATE OF DISCHARGE:                                 OPERATIVE REPORT   PROCEDURE PERFORMED:  Upper endoscopy.   ENDOSCOPIST:  Cleotis Nipper, M.D.   INDICATIONS FOR PROCEDURE:  The patient is a 55 year old female who was  admitted to the hospital with pneumonia but noted to have dark heme positive  stool (on iron) with a roughly 3 gm drop in hemoglobin over the past couple  of weeks, with her having been started on Plavix in the interim while being  continued on a lower dose of aspirin than previously.  Note that she is on  high dose Prevacid.   FINDINGS:  Normal exam.   DESCRIPTION OF PROCEDURE:  The nature, purpose and risks of the procedure  had been discussed with the patient, who provided written consent.  She was  brought in a fasted state from her hospital room to the endoscopy unit.  Sedation was cautiously administered with Versed 5 mg IV (no fentanyl)  to a  level of very mild sedation, intentionally keeping the patient light in view  of her history of sleep apnea, COPD and current pneumonia.  There was no  problem with arrhythmias or desaturation or other problems during the  examination.   The Olympus small caliber adult video endoscope was passed under direct  vision.  The vocal cords looked normal.  The esophagus was normal in its  entirety without any evidence of reflux esophagitis, Barrett's esophagus,  varices, infection, neoplasia, ring, stricture or  hiatal hernia.   The stomach contained a very small bilious residual and had normal mucosa  without any evidence of aspirin or Plavix induced gastropathy, no erosions,  ulcers, polyps, masses  or gastritis.  A retroflex view of the cardia was  normal.   The pylorus, duodenal bulb and second duodenum looked normal.  The antrum,  pylorus, duodenal bulb and second duodenum were reinspected and again  appeared normal.  The scope was removed from the patient.  No biopsies were  obtained.  The patient tolerated the procedure well and there were no  apparent complications.   IMPRESSION:  Normal endoscopic examination.  No source of heme positive  stool or decreased hemoglobin identified.  No evidence of gastropathy from  aspirin or Plavix exposure.    RECOMMENDATIONS:  1. Okay to restart aspirin and to continue Plavix in this patient.  2. I would favor elective colonoscopy when the patient is clinically back to     baseline since the source of the heme positivity was not identified in     this patient  and since there is a family history of colon cancer in her     father.                                               Cleotis Nipper, M.D.    RVB/MEDQ  D:  04/26/2002  T:  04/26/2002  Job:  [id number]   cc:   Darrold Span. Florene Glen, M.D.  [place]  [place] [address]  Fax: [place]. [name], M.D.  [phone] [address] [place] [address]  Fax: [phone]

## 2011-01-02 NOTE — Op Note (Signed)
Katherine [name], Katherine [name]              ACCOUNT NO.:  [id number]   MEDICAL RECORD NO.:  [id number]          PATIENT TYPE:  AMB   LOCATION:  ENDO                         FACILITY:  [facility]   PHYSICIAN:  [name], M.D.   DATE OF BIRTH:  [birth year]   DATE OF PROCEDURE:  06/07/2006  DATE OF DISCHARGE:  06/07/2006                                 OPERATIVE REPORT   PROCEDURE:  Video capsule endoscopy.   INDICATION:  Heme-positive stool and anemia with negative outpatient  endoscopy and colonoscopy.   FINDINGS:  Very limited exam but no gross abnormalities identified.   PROCEDURE:  The procedure had been discussed with the patient who provided  consent.  She swallowed the capsule as an outpatient and returned  approximately 8 hours later to remove the monitoring equipment.   FINDINGS:  The demarcation between the stomach and duodenum, and the ileum  and the colon, was indistinct.  Therefore, transit times are unclear but it  appeared that duodenal entry was in 17 minutes and small bowel transit time  was approximately 4-1/2 to 5 hours.   This study was significantly compromised by the presence of very little  visualization of small bowel mucosa.  There were multiple factors for this  including debris, retained food, and pill fragments in the small bowel, with  large periods of whiteout during which time nothing could be seen at all  in terms of small bowel mucosa.   Thus, this cannot really be considered a definitive study of the appearance  of the small bowel mucosa.   With the most limitations, however, I did not see any gross mucosal  abnormalities such as tumors ulceration, inflammation or vascular ectasia,  nor any other source of heme-positivity or anemia.   RECOMMENDATIONS:  Due to the limited character of this study, it is  difficult to make any firm recommendations or interpretations.  There were  certainly no overt abnormalities identified on this exam which would mandate  any specific treatment.  Thus, I would follow the patient clinically per now  but if the problem of heme-positive stool and anemia persists, I would  consider possibly doing a repeat study and, after a longer period of fasting  and with a purge of the bowel prior to the procedure to hopefully clean out  any debris or material that might compromise the exam by obscuring small  bowel mucosa.           ______________________________  [name], M.D.     RB/MEDQ  D:  06/15/2006  T:  06/16/2006  Job:  [id number]

## 2011-01-02 NOTE — H&P (Signed)
Katherine [name], Katherine [name]                        ACCOUNT NO.:  [id number]   MEDICAL RECORD NO.:  [id number]                   PATIENT TYPE:  INP   LOCATION:  5523                                 FACILITY:  [facility]   PHYSICIAN:  Sol Blazing, M.D.             DATE OF BIRTH:  Mar 22, 1956   DATE OF ADMISSION:  04/23/2002  DATE OF DISCHARGE:                                HISTORY & PHYSICAL   REASON FOR ADMISSION:  Fever and hypotension.   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  [name], M.D., after having undergone stenting of her saphenous vein graft  to the diagonal.  She spiked a temperature on the night prior to discharge  to 101 degrees.  Blood cultures were done and had remained negative.  Urinalysis was positive for cloudiness, microscopic hematuria, positive  leukocyte esterase, a few yeast seen, and 7-10 white blood cells per high-  power field.  She was discharged home on ampicillin 250 mg q.i.d.  She  continued to have temperatures, according to the patient, ranging between  100-101 degrees.  She was seen in our office on April 18, 2002, and  changed to [place].  A chest x-ray on April 10, 2002, showed cardiomegaly  and questionable early edema.  She had been a heavy smoker, having quit two  weeks ago because she felt bad.  She admits to having a very bronchitic,  wheezing cough productive of green sputum.  She has anterior chest wall pain  with deep inspiration and cough.  She has been very nauseated, but without  vomiting.  She also notes that her stools have become dark.  She states she  had a boil on her left buttock, which drained spontaneously last week.  She  denies dysuria, allograft pain, angina, or  hematemesis.  She is being  admitted to rule out sepsis.   ALLERGIES:  NUBAIN, CODEINE, ERYTHROMYCIN, SULFA, and TETRACYCLINE.   CURRENT MEDICATIONS:  1. Coated aspirin 81 mg daily.  2. Plavix 75 mg daily.  3. Prevacid 30 mg daily.  4. Glucotrol 10 mg in the morning and 5 mg in the evening.  5. Prednisone 7.5 mg daily.  6. Imuran 125 mg daily.  7. Actos 45 mg daily.  8. Lopressor 25 mg b.i.d.  9. Pravachol 20 mg with supper.  10.      Zoloft 150 mg daily.  11.      TriCor 64 mg daily.  12.      Singulair 10 mg at bedtime.  13.      Lasix 40 mg twice a day.  14.      Ativan 1 mg every 12 hours as needed for anxiety.   PAST  MEDICAL HISTORY:  1. 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  2. 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  3. Anxiety and depression.  4. Fibromyalgias.  5. Coronary artery disease, status post CABG in 1995.  She has had multiple     cardiac interventions since then.  6. History of atrial fibrillation followed by [facility] and     Vascular cardiologists.  7. Recurrent sinusitis.  8. History of bronchitis.  9. COPD with tobacco abuse up until two weeks ago.  10.      Anemia.   FAMILY HISTORY:  Negative for diabetes and renal disease.  Her mother died a  couple of months ago at the age of 29 of cirrhosis.   SOCIAL HISTORY:  The patient is single without children on disability.  Denies alcohol use.  She was a heavy smoker up until two weeks ago.   REVIEW OF SYMPTOMS:  Positive for productive cough, rib cage pain, nausea,  dark stools, and shortness of breath.  The patient denies dysuria, allograft  pain, angina, hematemesis, and bright red blood per rectum.   PHYSICAL EXAMINATION ON ADMISSION:  VITAL SIGNS:  Blood pressure 85/22  supine, temperature 100.1 degrees, respirations 20, pulse 76 and regular.  GENERAL APPEARANCE:  A  chronically ill-appearing, obese, white female who is  coughing constantly, but otherwise awake, alert, appropriate, and oriented x  3.  HEENT:  The posterior pharynx is mildly injected.  The tympanic membranes  are clear.  NECK:  Supple.  LUNGS:  Rhonchi throughout with expiratory wheezes.  HEART:  Regular rate and rhythm.  Grade 2/6 systolic murmur at the left  sternal border.  ABDOMEN:  Obese, soft, and nontender.  The left lower quadrant allograft is  nontender.  The right groin catheterization site is well healed.  RECTAL:  Stool was dark brown and strongly heme positive.  Left buttock with  a superficial excoriation approximately 0.5 cm in size and healing.  EXTREMITIES:  Trace ankle and foot edema.   LABORATORY DATA:  Sodium 136, potassium 3.3, chloride 104, CO2 24, BUN 15,  creatinine 1.5, glucose 162.  LFTs within normal limits.  Hemoglobin 9.8,  white count 10,300 with 77 segs, 13 lymphocytes, and 8 monocytes, and  platelets 244,000.  The chest x-ray is pending.   ASSESSMENT AND PLAN:  1. Fevers, hypotension, and nausea.  Will rule out sepsis by checking blood     and urine cultures.  The most likely source is pulmonary, but there has     been no improvement on appropriate antibiotic therapy.  Consider viral     etiology or broadening antibiotic coverage.  2. Funguria on April 10, 2002.  Will repeat urine cultures and urinalysis.     Consider need for empiric antifungal.  3. Heme-positive stools.  The patient has dropped her hemoglobin 3 g over     the last two weeks.  Will increase her Prevacid to b.i.d.  Discuss with     the cardiologist the need of aspirin and Plavix with recent stenting.  If     not improvement, consider GI consult.  4. Renal transplant.  The BUN and creatinine are stable for now.  Will     consider immunosuppressants, but holding Lasix.  5. Diabetes mellitus, type 2, good control. 6. Coronary artery disease, status post coronary artery bypass graft  in 1995     with  recent stenting of the saphenous vein graft to diagonal on April 12, 2002, stable.  Continue Lopressor.  7.     Anxiety and depression.  Continue Zoloft and p.r.n. Ativan.  8. Chronic obstructive pulmonary disease and bronchitis.  Will add nebulized     bronchodilators.  Follow-up chest x-ray.  Will decide upon further     antibiotic coverage.     Nonah Mattes, P.A.                      Sol Blazing, M.D.    RRK/MEDQ  D:  04/23/2002  T:  04/24/2002  Job:  [id number]   cc:   Delfino Lovett A. [name], M.D.  [phone] [address], [place] [address]  Fax: [facility]

## 2011-01-02 NOTE — Op Note (Signed)
   NAMEWILLIAM, TORSIELLO                        ACCOUNT NO.:  [id number]   MEDICAL RECORD NO.:  [id number]                   PATIENT TYPE:  OIB   LOCATION:  2550                                 FACILITY:  [facility]   PHYSICIAN:  John L. Rendall III, M.D.           DATE OF BIRTH:  12/18/1955   DATE OF PROCEDURE:  04/02/2003  DATE OF DISCHARGE:                                 OPERATIVE REPORT   PREOPERATIVE DIAGNOSIS:  Radial head fracture with fragmentation.   POSTOPERATIVE DIAGNOSIS:  Radial head fracture with fragmentation.   SURGICAL PROCEDURE:  Excision radial head.   SURGEON:  John L. Rendall, M.D.   ANESTHESIA:  General.   DESCRIPTION OF PROCEDURE:  Under general anesthesia a hockey-stick incision  was made over the lateral  elbow. Dissection was carried down through the  extensor retinaculum exposing the lateral  elbow joint  and the radial head.  The radial neck  was exposed. A baby Homan was placed medially and a freer  laterally and an oscillating saw was used to excise the radial head.   With it removed the defect of approximately 25% is found. The floating  fragment is found in the joint. It fits exactly and it was this that was  blocking motion. With the fragment out the elbow regained full range of  motion.   The fascia was then closed with 0 Vicryl, the subcu with 2-0 Vicryl and the  skin with clips. The elbow was then infiltrated with Marcaine with morphine  with epinephrine and a sterile compression bandage was applied with a long-  arm splint.   The patient returned to recovery in good condition. Tourniquet time was 20  minutes.                                                John L. Wynona Luna, M.D.    Judie Grieve  D:  04/02/2003  T:  04/02/2003  Job:  [id number]

## 2011-01-02 NOTE — Discharge Summary (Signed)
[place]  Patient:    Katherine [name], Katherine [name]                     MRN: [id number] Adm. Date:  [id number] Disc. Date: [id number] Attending:  Epifanio Lesches Dictator:   Alroy Bailiff, P.A. CC:         [name], M.D.             James L. Deterding, M.D.                           Discharge Summary  ADMISSION DIAGNOSES:  1. Unstable angina.  2. History of coronary artery disease.  3. Status post two ______ prior to coronary artery bypass grafting.     a. Coronary artery bypass grafting 1995, by Dr. Servando Snare.     b. Status post Persantine Cardiolite May 28, 1998, with apical scar in        left anterior descending territory.  No ischemia.  Ejection fraction        65%.     c. No catheterization since her coronary artery bypass grafting.  4. Hyperlipidemia.  5. Ongoing tobacco abuse.  6. Obesity.  7. Status post renal transplant 1991.  8. History of atrial fibrillation.  9. Fibromyalgia. 10. Sleep apnea. 11. Chronic anxiety. 12. Asthma. 13. Echocardiogram September 11, 1999, with normal left ventricular function,     mild mitral regurgitation, mild tricuspid regurgitation, moderate left     atrial enlargement. 14. Status post mouth surgery for upper and lower dentures. 15. 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 16. History of Port-A-Cath and history of catheter for peritoneal dialysis in     the past. 17. Sinusitis.  DISCHARGE DIAGNOSES:  1. Unstable angina.  2. History of coronary artery disease.  3. Status post two ______ prior to coronary artery bypass grafting.     a. Coronary artery bypass grafting 1995, by Dr. Servando Snare.     b. Status post Persantine Cardiolite May 28, 1998, with apical scar in        left anterior descending territory.  No ischemia.  Ejection fraction        65%.     c. No catheterization since her coronary artery bypass grafting.  4. Hyperlipidemia.  5. Ongoing tobacco  abuse.  6. Obesity.  7. Status post renal transplant 1991.  8. History of atrial fibrillation.  9. Fibromyalgia. 10. Sleep apnea. 11. Chronic anxiety. 12. Asthma. 13. Echocardiogram September 11, 1999, with normal left ventricular function,     mild mitral regurgitation, mild tricuspid regurgitation, moderate left     atrial enlargement. 14. Status post mouth surgery for upper and lower dentures. 15. 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 16. History of Port-A-Cath and history of catheter for peritoneal dialysis in     the past. 17. Status post cardiac catheterization by Dr. Alla German on Dec 25, 1999,     with percutaneous transluminal coronary angioplasty and stent to the     saphenous vein graft to diagonal insertion. 18. Sinusitis.  HISTORY OF PRESENT ILLNESS:  Katherine [name] is a 55 year old white female with a history of CAD, status post CABG in 1995, history of hyperlipidemia, ongoing tobacco abuse, history of atrial fibrillation, and history of renal transplant in 1991, who presented to [facility] Emergency Room on Dec 24, 1999, with complaints of  chest pain.  Katherine [name] stated that at approximately 1 oclock that day she had the onset of chest pain.  At that time, she had just had an argument with her husband. She stated that the pain was in the middle of her chest, went to bilateral shoulders and bilateral arms.  There was no radiation to the neck.  She had no shortness of breath, no diaphoresis, no nausea or vomiting.  She did have some increased fatigue.  She states that she took a sublingual nitroglycerin at home.  The pain eased in approximately 15 minutes and continued to be eased at about the time of EMS arrival.  The pain had then returned into the left arm at the time of the interview in the ER.  However, she continued to have no further chest pain at that time.  Additionally, she did state that she had had a decreased appetite throughout  the day.  She states todays symptoms were similar to her previous angina.  Prior to that day of admission she had not been having any chest pain, even with exertion such as going up and down her stairs or with baby-sitting.  She stated that she had quite a bit of increased stress recently with her husband and finances.  On exam at that time her heart rate was 57, blood pressure 136/100.  She was an obese white female, somewhat tearful at times.  Her heart was a regular rhythm with rare PVCs.  She had no lower extremity edema.  EKG at that time revealed sinus bradycardia, occasional PVCs, slight ST depression in leads I and aVL, but no change since EKG since September 05, 1999.  At that time, we planned to admit her to telemetry for monitoring and to check cardiac enzymes to rule out myocardial infarction.  We placed her on IV heparin, IV nitroglycerin, aspirin, Plavix, in addition to her home medications.  She was then planned for possible cardiac catheterization in the morning.  HOSPITAL COURSE:  On the morning of Dec 25, 1999, Katherine [name] had a headache secondary to nitroglycerin.  As well, during the night she had been given Nubain which caused lots of nausea and vomiting.  However, she had had no more chest pain.  At this point her enzymes were negative x 3.  Her CBC and BMP were normal.  She was then planned for cardiac catheterization.  On Dec 25, 1999, Katherine [name] underwent cardiac catheterization by Dr. Alla German.  Please see his dictated catheterization report for further details. He performed PTCA and stent to the saphenous vein graft to the diagonal insertion.  This went from a 95% stenosis to a less than 10% residual.  At that time he planned for no further heparin.  He planned for Plavix 75 mg a day and hydration secondary to renal transplant.  On the morning of Dec 26, 1999, Katherine [name] is doing well after having her catheterization and stent yesterday.  She has  had no further chest pain.  She  is in normal sinus rhythm.  Her systolic blood pressure is 115.  She is felt to be stable for discharge home at this time.  CONSULTATIONS:  None.  PROCEDURES:  Cardiac catheterization on Dec 25, 1999, by Dr. Alla German with PTCA and stent to the saphenous vein graft to the diagonal insertion.  LABORATORY DATA:  Cardiac enzymes were negative x 3.  CKs were 104, 89, and 90.  CK-MBs 3.5, 3.1, 3.6.  Relative indices 3.4, 3.5, 4.0.  Troponin I less than 0.03 x 3.  Additionally, BMP and CBC were normal.  Sodium 137, potassium 3.6, BUN 18, creatinine 0.9.  WBC 10.0, hemoglobin 12.2, hematocrit 38.7, platelets 297.  TSH normal at 0.565.  Additionally, the patient was screened for the Prove-It trial, but does meet criteria for the trial.  Chest x-ray Dec 24, 1999, shows borderline cardiomegaly.  No convincing evidence of acute disease.  EKG on Dec 24, 1999, revealed sinus bradycardia, 58 beats per minute, with some occasional PVCs.  There is some ST change in II, III, and aVF, but these are unchanged from her old EKG.  DISCHARGE MEDICATIONS:  1. Norvasc 5 mg once a day.  2. Imdur 30 mg once a day.  3. Plavix 75 mg once a day.  4. Tricor 200 mg once a day.  5. Pravachol 80 mg each night.  6. Lopressor 50 mg twice a day.  7. Tequin 400 mg once a day for a total of 10 days for her sinusitis.  8. Nitroglycerin 0.4 mg sublingual as directed.  9. Prednisone 10 mg once a day. 10. Zoloft 75 mg at night. 11. Enteric-coated aspirin 325 mg once a day. 12. Imuran 125 mg each night. 13. Ativan 1 mg twice a day. 14. Prevacid 30 mg once a day. 15. Darvocet, Zyrtec, and inhalers as before.  ACTIVITY:  No strenuous activity, lifting greater than five pounds, driving, or sexual activity for three days.  DIET:  Low salt, low fat.  WOUND CARE:  May gently wash the groin with warm water and soap.  Call the office at [phone] if any bleeding, increased size, or  pain of the groin.  FOLLOW-UP:  Follow up with Dr. [name] in the [facility] on January 26, 2000, at 4:15 p.m. DD:  12/26/99 TD:  12/28/99 Job: [id number]

## 2011-01-02 NOTE — H&P (Signed)
Katherine [name], Katherine [name]              ACCOUNT NO.:  [id number]   MEDICAL RECORD NO.:  [id number]          PATIENT TYPE:  INP   LOCATION:  2023                         FACILITY:  [facility]   PHYSICIAN:  Sherril Croon, M.D.   DATE OF BIRTH:  1956-03-20   DATE OF ADMISSION:  06/22/2005  DATE OF DISCHARGE:                                HISTORY & PHYSICAL   REASON FOR ADMISSION:  Weakness x2 weeks.   HISTORY OF PRESENT ILLNESS:  A 55 year old white female status post renal  transplant 1991, status post coronary artery disease, status post myocardial  infarction 1996 and CABG, status post gastric bypass for obesity 2006,  frequent urinary tract infections.  Complaining of fatigue, weakness,  falling, sore throat, intermittent fevers, nausea, vomiting, sinus  congestion, mucous drainage, no blood.  A retromandibular __________  adenopathy two weeks.  No foreign travel.  No insect bites.  No sick  contact.  No tick bites.   PAST MEDICAL HISTORY:  1.  Diabetes mellitus type 2.  2.  History of renal transplantation 1991.  3.  Fibromyalgia.  4.  Coronary artery disease status post myocardial infarction.  5.  Coronary artery bypass grafting 1996.  6.  History of obesity status post lap banding July of 2006.  7.  Anemia.  8.  History of __________.  9.  Hypotension.  10. Sleep apnea.  11. COPD.  12. Gout.   MEDICATIONS:  1.  Glucotrol 15 mg b.i.d.  2.  Actos 45 mg daily.  3.  Lantus insulin 10 units daily.  4.  Atrovent two puffs q.i.d.  5.  Pulmicort two puffs b.i.d.  6.  Maxair two puffs q.4h. p.r.n.  7.  Imuran 75 mg daily.  8.  Prednisone 5 mg daily.  9.  Pravachol 80 mg daily.  10. TriCor 145 mg daily.  11. Lasix 80 mg b.i.d.  12. Epogen.  13. Lopressor query dose.  14. Singulair 10 mg daily.  15. Darvocet two tablets b.i.d.  16. Aspirin 81 mg daily.  17. Potassium chloride 20 mEq daily.   There are inconsistencies with medications on list.   ALLERGIES:  CODEINE, NUBANE,  SULFA, ERYTHROMYCIN.   FAMILY HISTORY:  Two brothers with renal disease.   SOCIAL HISTORY:  No tobacco.  No alcohol.   REVIEW OF SYSTEMS:  GENERAL:  Positive for malaise, positive for fever.  No  chills.  HEENT:  Eyes:  No visual complaints.  No eye pain.  Ears, nose,  mouth, throat:  Complains of sore throat, sinus congestion.  No epistaxis.  CARDIOVASCULAR:  No anginal chest pain, orthopnea, PND.  No ankle or leg  swelling.  No syncope.  RESPIRATORY:  Denies cough, wheeze, hemoptysis.  ABDOMINAL SYSTEM:  Weight loss 100 pounds status post gastric bypass  surgery.  Complains of intermittent nausea and vomiting.  No diarrheal  stools.  UROGENITAL:  No urgency, frequency, or dysuria.  NEUROLOGIC:  No  history of diplopia.  Positive for vertigo.  Negative for dysarthria,  dysphonia, dysphagia.  No paraesthesias.  MUSCULOSKELETAL:  Diffuse muscle  weakness.  No joint swelling.  DERMATOLOGIC:  No skin rashes.   PHYSICAL EXAMINATION:  GENERAL:  Alert, very pleasant lady lying comfortably  in bed, no obvious distress.  VITAL SIGNS:  Blood pressure 110/67, pulse 68, temperature afebrile.  HEENT:  Normocephalic, atraumatic.  No icterus or pallor.  Pupils round,  equal, reactive.  Ear, nose, mouth, throat __________ was normal.  Tympanic  membranes were clear with some chronic scar tissue noted on both bilateral  tympanic membranes.  Nasal mucosa clear.  Oropharynx was clear.  NECK:  Supple.  No thyromegaly and no adenopathy.  No JVD.  No thyromegaly.  CARDIOVASCULAR:  No heaves, thrills, rubs, or gallops.  Regular rate and  rhythm.  RESPIRATORY:  Lung fields are clear to auscultation.  No wheezes or rales.  ABDOMEN:  Soft, nontender.  No organo/splenomegaly.  Bowel sounds are  present.  EXTREMITIES:  No clubbing, cyanosis, edema.  Attenuated reflexes.  NEUROLOGIC:  Cranial nerves II-XII intact.  No loss of sensation upper/lower  extremities.   LABORATORIES:  Urinalysis:  3-6 wbc's.  WBC  11.6, hemoglobin 10.8, platelets  316.  Sodium 139, potassium 3.2, chloride 103, CO2 29, BUN 16, creatinine  1.1, glucose 109, calcium 9.6.  Chest x-ray cleared.  EKG:  Normal sinus  rhythm, inferior Q-waves.   ASSESSMENT/PLAN:  1.  Fatigue and weakness.  Check echocardiogram.  Check cortisol.  Possible      urinary tract infection.  Continue Cipro 500 mg b.i.d.  2.  Status post renal transplantation.  Continue immunosuppressants, stable      renal function.  3.  Diabetes mellitus.  Accu-Cheks q.6h.  Continue Glucotrol.  Will need to      reconcile her home medications with her prescribed medications in      hospital.  4.  Hypokalemia.  Will replete with oral potassium chloride.     Sherril Croon, M.D.  Electronically Signed    MWW/MEDQ  D:  06/22/2005  T:  06/23/2005  Job:  [id number]

## 2011-01-02 NOTE — Discharge Summary (Signed)
Katherine [name], Katherine [name]              ACCOUNT NO.:  [id number]   MEDICAL RECORD NO.:  [id number]          PATIENT TYPE:  INP   LOCATION:  5509                         FACILITY:  [facility]   PHYSICIAN:  Sol Blazing, M.D.DATE OF BIRTH:  August 14, 1956   DATE OF ADMISSION:  09/12/2006  DATE OF DISCHARGE:  09/15/2006                               DISCHARGE SUMMARY   ADMITTING DIAGNOSES:  1. Acute on chronic bronchitis.  2. 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  3. Mild hypovolemia and hypotension.  4. Type 2 diabetes mellitus.  5. Anemia of chronic disease.  6. Coronary artery disease, status post CABG.  7. Obstructive sleep apnea.  8. Obesity.  9. History of gastric banding 2006.   DISCHARGE DIAGNOSIS:  1. Acute on chronic bronchitis.  2. Cadaveric renal transplant with baseline creatinine.  3. Hypovolemia and hypotension, resolved with intravenous fluids and      antibiotics.  4. Type 2 diabetes mellitus.  5. Anemia of chronic disease.  6. Coronary artery disease, status post CABG.  7. Obstructive sleep apnea.  8. Obesity.  9. Gastric banding, 2006.   BRIEF HISTORY:  A 55 year old white female with end-stage renal disease,  status post cadaveric renal transplant in 1991, with baseline creatinine  approximately 1 or less, who also has type 2 diabetes mellitus, coronary  artery disease status post CABG, obesity with obstructive sleep apnea,  history of chronic bronchitis due to severe tobacco abuse in the past  presents to the emergency room with shortness of breath, dyspnea on  exertion, malaise, arthralgias fevers, chills, excessive coughing that  is nonproductive, clear rhinorrhea, earaches, occasional vomiting, loss  of appetite, decreased oral intake with symptoms progressively worsening  over the last 2 weeks.  Today, she presents to the emergency room due to  worsening shortness of breath specifically.  She denies dysuria,  diarrhea, abdominal pain,  transplant pain.  She has not coughed up any  blood.  In the ER her pressure is lower than usual with a systolic blood  pressure in the 90s.  She is being admitted for acute on chronic  bronchitis, hypovolemia and hypotension requiring IV antibiotics and  hydration.   ADMISSION LABORATORY:  Creatinine 0.8, BUN 11, glucose 94.  Hemoglobin  14.4, white count 11,700, platelets 252,000.  Sodium 138, potassium 3.5,  chloride 105, CO2 24.  Urinalysis shows a specific gravity of 1.022, pH  5.5, positive for moderate leukocyte esterase, with 3-6 white cells per  high power field, no blood seen.  Chest x-ray is read as no active  disease.   HOSPITAL COURSE:  The patient was admitted and received a couple of  boluses of normal saline and then continuous IV fluids over the next 72  hours.  IV Avelox was given in the ER.  And, then she was switched to  vancomycin and Zosyn.  She was initially admitted to the step-down  unit  and her blood pressure stabilized within 24-48 hours, and she was moved  to a renal floor.  Influenza A and B swabs taken were both negative.  Urine grew out 75,000  colonies of multiple bacterial morphotypes.  Blood  cultures remained negative.  Her white blood cell count returned to  within normal limits.  Her creatinine stayed below 0.8.  Clinically, she  was much improved and near baseline.  She was discharged on Avelox to  complete a 2-week course of antibiotics.   DISCHARGE MEDICATIONS:  1. Avelox 400 mg, one daily for 10 more days.  2. Tussionex 1 teaspoon b.i.d. p.r.n.  3. Prilosec 20 mg daily.  4. Prednisone 5 mg daily.  5. Imuran 125 mg daily.  6. Plavix 75 mg daily.  7. TriCor 145 mg daily.  8. Zoloft 150 mg daily.  9. Baby aspirin 81 mg daily.  10.Pravachol 80 mg bedtime.  11.Glucotrol 5 mg at breakfast.  12.Singulair 10 mg daily.  13.Potassium chloride 20 mEq daily.  14.Rhinocort 1-2 puffs p.r.n.  15.Ventolin inhaler 2 puffs four times a day p.r.n.   16.Ativan 1 mg b.i.d. p.r.n.      Nonah Mattes, P.A.      Sol Blazing, M.D.  Electronically Signed    RRK/MEDQ  D:  11/27/2006  T:  11/27/2006  Job:  [id number]

## 2011-01-02 NOTE — Discharge Summary (Signed)
[place]  Patient:    IRERI, NEWBROUGH                     MRN: [id number] Adm. Date:  [id number] Disc. Date: [id number] Attending:  Dennis Bast Dictator:   Maia Plan, P.A.                           Discharge Summary  ADMITTING DIAGNOSES: 1. Nausea, vomiting and diarrhea. 2. End-stage renal disease, status post renal transplant in 1991. 3. Diabetes mellitus. 4. Chronic obstructive pulmonary disease. 5. Chronic sinusitis.  DISCHARGE DIAGNOSES: 1. Nausea, vomiting and diarrhea, resolved, probably secondary to    gastroenteritis. 2. Bilateral otitis media. 3. Pharyngitis. 4. End-stage renal disease, status post renal transplant in 1991. 5. Hepatitis A negative. 6. Diabetes mellitus.  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  She was recently seen by the ear, nose and throat doctors for chronic sinusitis and put on Omnicef.  Her mother has recently been diagnosed with hepatitis A in October 2001.  She denies abdominal pain, shortness of breath, chest pain or hematemesis.  LABORATORY DATA AND X-RAY FINDINGS:  Hemoglobin 11.1, white count 5900, platelets 215,000.  Sodium 136, potassium 3.8, chloride 103, CO2 21, BUN 20, creatinine 1.1, glucose 181.  Lipase 92.  HOSPITAL COURSE:  Stools were sent off for cultures and these have remained negative.  A three-way abdominal film showed no active disease with normal bowel gas pattern.  Urine and blood cultures were all negative.  Hepatitis A serologies were also negative.  She complained of ear pain and upon exam, it was obvious she bilateral otitis media.  She also had some blepharitis with injected eyelids and  perilimbal hyperemia.  She was empirically put on Augmentin 875 mg b.i.d.  IV fluids were stopped after 24 hours.  Her BUN and creatinine remained stable with a creatinine of 1.0.  She felt much better and was tolerating solid foods.  She probably had a bout of gastroenteritis that may have been induced by her otitis media, chronic sinusitis and pharyngitis.  DISCHARGE MEDICATIONS:  1. Glucotrol 10 mg q.a.m.  2. Actos 45 mg at bedtime.  3. Prednisone 10 mg daily.  4. Imuran 125 mg daily.  5. Lopressor 25 mg b.i.d.  6. Zoloft 75 mg daily.  7. Prevacid 30 mg b.i.d.  8. Pravachol 80 mg with supper.  9. Coated aspirin one daily. 10. Aquatab for coughing b.i.d. 11. Advair 500 mg one puff b.i.d. 12. Maxair p.r.n. 13. Singular one q.h.s. 14. Tri-chlor one q.h.s. 15. Ativan 1 mg b.i.d. 16. Augmentin 875 mg b.i.d. x 7 more days.  FOLLOWUP:  She will follow up with Dr. Jimmy Footman as an outpatient. DD: 07/16/00 TD:  07/16/00 Job: [id number]

## 2011-01-02 NOTE — Consult Note (Signed)
Katherine [name], Katherine [name]                        ACCOUNT NO.:  [id number]   MEDICAL RECORD NO.:  [id number]                   PATIENT TYPE:  INP   LOCATION:  2041                                 FACILITY:  [facility]   PHYSICIAN:  Sherril Croon, M.D.                DATE OF BIRTH:  Dec 08, 1955   DATE OF CONSULTATION:  04/11/2002  DATE OF DISCHARGE:                                   CONSULTATION   REASON FOR CONSULTATION:  Status post renal transplant in 1991, plans for  coronary angiogram.   HISTORY OF PRESENTING ILLNESS:  This is a very pleasant 55 year old white  female, status post cadaveric renal transplant in 1991 at [facility].  She has a stable allograft function and is followed by Dr.  Jeneen Rinks L. Deterding.  She was admitted for unstable angina.  She has been  seen by Dr. Nanine Means. [name] in the past and has undergone several  stenting procedures to her coronaries this year.   PAST MEDICAL HISTORY:  1. 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  2. Status post cadaveric renal transplantation in 1991, no rejections or     infections.  3. Diabetes mellitus, type 2, post-transplant steroids.  4. Status post CABG in 1995, catheterization in March of 2003, stenting to     diagonal.  5. COPD secondary to tobacco abuse.  6. Anxiety and depression.  7. Fibromyalgia.  8. History of atrial fibrillation.  9. History of recurrent sinusitis.   MEDICATIONS:  1. Lasix 40 mg b.i.d.  2. Aspirin 81 mg q.d.  3. Rhinocort one spray q.d.  4. Actos 45 mg q.d.  5. Glucotrol 10 mg in the morning, 5 mg in the evening.  6. Darvocet p.r.n.  7. Imuran 125 mg q.d.  8. Lopressor 25 mg q.d.  9. Zoloft 150 mg q.d.  10.      Prevacid 30 mg b.i.d.  11.      Pravachol 80 mg q.d.  12.      Aquatabs b.i.d.  13.      Advair b.i.d.  14.      Maxair p.r.n.  15.      Singulair 10 mg q.d.  16.      Tricor 67 mg q.h.s.  17.      Ativan b.i.d. p.r.n.   ALLERGIES:  SULFA, ERYTHROMYCIN, NUBAIN, and to CODEINE.   SOCIAL HISTORY:  She recently has lost her mother secondary cirrhosis in  March of this year.  She was the sole caregiver.  She lived with her mother  and feels a sense of grief.  Smoker, four cigarettes per day, no alcohol  use.  She is unmarried.  No children.  She has nine siblings.   FAMILY HISTORY:  Her mother died at 64 of cirrhosis.  She has no renal  disease in the family.   REVIEW OF SYSTEMS:  GENERAL:  No  fever, sweats or chills.  EYES:  No visual  complaints.  Wears glasses.  EARS, NOSE AND THROAT:  States she has no  damage bilaterally to her hearing but she complains of sinus congestion  intermittent but no active runny nose maxillary sinus tenderness.  CARDIOVASCULAR:  She has a history of anginal chest pain, increasing in  frequency one to two months.  States that she swims as a hobby but it has  become increasingly difficult due to her limiting chest pain.  She sleeps  with two pillows.  She denies PND.  She has some ankle swelling.  She denies  history of DVT or PE.  RESPIRATORY SYSTEM:  She is a smoker and has a  history of chronic obstructive pulmonary disease for which she sees  pulmonology and is currently medicated with inhalers.  She has no history of  tuberculosis or asbestos exposure.  ABDOMINAL SYSTEM:  She has complained  for many years of weight issues, obesity and having difficulty in losing  weight.  She is sedentary.  She has a history of gastroesophageal reflux  disease which is controlled with medication.  She has no history of  colonoscopy and no blood loss per stools.  UROGENITAL:  She also complains  of urgency and frequency and smelly, burning urine.  NEUROLOGIC:  She denies  any headache, double vision, blurry vision, difficulty to speak or swallow  and no numbness, pins and needles or weakness in her upper or lower  extremities.  PSYCHIATRIC:  She complains of depression with no suicidal  or  homicidal ideations.  ENDOCRINE:  She has no history of thyroid disease.  She is diabetic after her steroid use and she has a history of  hyperlipidemia.  HEMATOLOGIC/ONCOLOGIC:  She has no personal history of  cancer and no history of bleeding disorders.   PHYSICAL EXAMINATION:  GENERAL:  Overweight, tearful, pleasant lady in no  obvious distress.  VITAL SIGNS:  Blood pressure 140/80, pulse of 80, temperature of 97.  HEENT:  Head and eyes:  Normocephalic, atraumatic.  No icterus.  No pallor.  Ears, nose, mouth and throat:  External appearance normal.  Oropharynx  clear.  NECK:  Neck was supple.  No thyromegaly.  No adenopathy.  Carotids were  normal with no bruits.  CARDIOVASCULAR:  Regular rate and rhythm with a faint systolic murmur.  No  rubs or gallops.  RESPIRATORY:  Percussion note was resonant.  Thick chest wall.  No  adventitial sounds.  ABDOMEN:  Obese, soft, nontender.  No hepatosplenomegaly.  No bruits heard  over the renal transplant.  EXTREMITIES:  No cyanosis, clubbing or edema.  DERMATOLOGIC:  No skin rashes or skin lesions.  NEUROLOGIC:  Cranial nerves II-XII are intact.  Reflexes were normal.   LABORATORY DATA:  Sodium 135, potassium 3.6, chloride 199, CO2 26, BUN 19,  creatinine 1.1, glucose 248.  LFTs normal.  WBC  9.3, hemoglobin 12.3, platelets 259,000.   ASSESSMENT AND PLAN:  1. End-stage renal disease, status post renal transplant, stable allograft     function.  2. Immunosuppression.  Continue prednisone and Imuran-based regimen.  3. Diabetes mellitus, sliding-scale coverage.  4. Coronary artery disease.  Plan contrast study.  Limit dye, risk of     contrast nephropathy approximately 5-10%, i.e., there is a chance of     increase in the serum creatinine by 50% and/or increasing the creatinine     above 2 mg/dl.  Suggest Mucomyst 600 mg b.i.d. today and day of  procedure, normal saline load prior to study.  Avoid vaso-    active drugs such as ACE  inhibitor, angiotensin II receptor blockers,     nonsteroidals and cyclosporin.  5. Depression.  Check thyroid-stimulating hormone, B12 and consult     psychiatry.  6. Frequency and dysuria.  Will send urine for culture.                                               Sherril Croon, M.D.    MWW/MEDQ  D:  04/11/2002  T:  04/13/2002  Job:  [id number]

## 2011-01-06 ENCOUNTER — Other Ambulatory Visit: Payer: Self-pay | Admitting: Rheumatology

## 2011-01-06 DIAGNOSIS — M545 Low back pain: Secondary | ICD-10-CM

## 2011-01-14 ENCOUNTER — Other Ambulatory Visit: Payer: Medicare Other

## 2011-01-16 ENCOUNTER — Telehealth: Payer: Self-pay | Admitting: *Deleted

## 2011-01-16 MED ORDER — FLUTICASONE PROPIONATE 50 MCG/ACT NA SUSP: 2.0000 | Freq: Every day | NASAL | Status: DC

## 2011-01-16 NOTE — Telephone Encounter (Signed)
Nasonex is not covered by pt's insurance and alternative will need to be prescribed. Nasonex changed to Flonase. Pharmacist aware of new rx. Pt also made aware.

## 2011-02-03 ENCOUNTER — Other Ambulatory Visit: Payer: Self-pay | Admitting: Emergency Medicine

## 2011-03-26 ENCOUNTER — Encounter (INDEPENDENT_AMBULATORY_CARE_PROVIDER_SITE_OTHER): Payer: Self-pay | Admitting: General Surgery

## 2011-03-26 ENCOUNTER — Other Ambulatory Visit: Payer: Self-pay | Admitting: Emergency Medicine

## 2011-03-27 ENCOUNTER — Encounter (INDEPENDENT_AMBULATORY_CARE_PROVIDER_SITE_OTHER): Payer: Self-pay | Admitting: General Surgery

## 2011-03-27 ENCOUNTER — Ambulatory Visit (INDEPENDENT_AMBULATORY_CARE_PROVIDER_SITE_OTHER): Payer: Medicare Other | Admitting: General Surgery

## 2011-03-27 NOTE — Patient Instructions (Signed)
Do your best to eliminate suite liquids. Call as needed for questions or concerns.

## 2011-03-27 NOTE — Progress Notes (Signed)
The patient returns for followup status post placement of lap band. She has had 2 previous episodes of over restriction and esophageal dilatation. We have been gradually refilling her band. She has 2 cc in her band which is what we expected she would need. She states that if she follows her diet to precisely she does okay. If she tries to eat more than 3 or 4 ounces or drinks liquids with her meals she will get nausea and vomiting. This happens about twice per week. She also has been drinking some soft drinks.  Her weight is down 2 pounds from her last visit which represents a total 78 pound weight loss from surgery.  We discussed the by history she certainly seems to have adequate restriction and that if we feel her any tighter she is likely to get into trouble again. She certainly has an opportunity for continued weight loss if she can eliminate the suite liquids and she's going to try to do this. She will return in 4 months.

## 2011-03-30 ENCOUNTER — Telehealth (INDEPENDENT_AMBULATORY_CARE_PROVIDER_SITE_OTHER): Payer: Self-pay

## 2011-03-30 NOTE — Telephone Encounter (Signed)
C/O boils on both thighs, and left groin.  Called in Doxy 100mg  #14 to [facility] on [address]. Appointment set for Friday with Dr. Excell Seltzer. Patient advised to call if things worsens before appointment. RMP

## 2011-04-01 ENCOUNTER — Encounter (INDEPENDENT_AMBULATORY_CARE_PROVIDER_SITE_OTHER): Payer: Self-pay | Admitting: General Surgery

## 2011-04-03 ENCOUNTER — Encounter (INDEPENDENT_AMBULATORY_CARE_PROVIDER_SITE_OTHER): Payer: Self-pay | Admitting: General Surgery

## 2011-04-03 ENCOUNTER — Ambulatory Visit (INDEPENDENT_AMBULATORY_CARE_PROVIDER_SITE_OTHER): Payer: Medicare Other | Admitting: General Surgery

## 2011-04-03 ENCOUNTER — Telehealth (INDEPENDENT_AMBULATORY_CARE_PROVIDER_SITE_OTHER): Payer: Self-pay | Admitting: General Surgery

## 2011-04-03 VITALS — BP 174/112 | HR 80 | Temp 97.6°F | Ht 61.0 in | Wt 200.2 lb

## 2011-04-03 DIAGNOSIS — L0291 Cutaneous abscess, unspecified: Secondary | ICD-10-CM

## 2011-04-03 NOTE — Patient Instructions (Signed)
Pack the 2 larger wounds with moist saline gauze twice daily. Baby A in a tub or shower with dressings off. If the redness enlarges or if there is high fever he will need to go to the emergency room.

## 2011-04-03 NOTE — Progress Notes (Signed)
History: Patient returns to the office due to void old son which have arisen on her hip, thigh, and groin. She has a history of MRSA subcutaneous abscesses in the past. These came up about 6 days ago. She did not want to go to the ER and waited to see me in the office. We called and doxycycline 3 days ago. The area on her hip has begun to drain today. She has not had any fever but has had some malaise.  Exam: She is afebrile. No distress. On the left hip is a 1 cm area of early skin breakdown and purulent drainage with an approximately 8 or 9 cm surrounding area of erythema. On the posterior right thigh is a very similar area with slightly smaller about 7 or 8 cm area of erythema. In the left groin at the thigh crease is a bout a 1 cm area of fluctuance with minimal surrounding erythema.  Assessment plan: Multiple subcutaneous abscesses consistent with her history of MRSA. In each of these areas under local anesthesia I excised skin and subcutaneous tissue unroofing the abscess. Cultures were obtained. The wounds were packed with moist saline gauze.  Wound care was discussed and her husband is proficient at this. We'll continue doxycycline. I gave her prescription for Percocet. The areas of erythema were marked and if they are enlarging or if she has any fever she is instructed to go to the emergency room. Return in one week.

## 2011-04-03 NOTE — Progress Notes (Signed)
Addended by: [name] Coria on: 04/03/2011 04:57 PM   Modules accepted: Orders

## 2011-04-06 LAB — WOUND CULTURE
Gram Stain: NONE SEEN
Gram Stain: NONE SEEN
Gram Stain: NONE SEEN

## 2011-04-09 ENCOUNTER — Other Ambulatory Visit: Payer: Self-pay | Admitting: Emergency Medicine

## 2011-04-10 ENCOUNTER — Telehealth (INDEPENDENT_AMBULATORY_CARE_PROVIDER_SITE_OTHER): Payer: Self-pay

## 2011-04-10 ENCOUNTER — Ambulatory Visit (INDEPENDENT_AMBULATORY_CARE_PROVIDER_SITE_OTHER): Payer: Medicare Other | Admitting: General Surgery

## 2011-04-10 ENCOUNTER — Encounter (INDEPENDENT_AMBULATORY_CARE_PROVIDER_SITE_OTHER): Payer: Self-pay | Admitting: General Surgery

## 2011-04-10 VITALS — BP 132/86 | Temp 97.3°F | Wt 193.8 lb

## 2011-04-10 DIAGNOSIS — L0291 Cutaneous abscess, unspecified: Secondary | ICD-10-CM

## 2011-04-10 NOTE — Patient Instructions (Signed)
Call for any concerns about the abscess sites or if vomiting continues.

## 2011-04-10 NOTE — Progress Notes (Signed)
History: Patient returns one week following incision and drainage of 2 abscesses, one on each thigh. She reports they are feeling better. Denies fever. She has had some occasional nausea and vomiting which she feels is due to the infection and antibiotics and not her lap band. Culture revealed MRSA sensitive to doxycycline which she is on. She has several days of antibiotics left.  Exam: Both wounds appear clean. There is marked reduction and now only minimal erythema and no induration.  Assessment and plan: MRSA skin abscesses improving after incision and drainage and antibiotics. Continue current wound care. These should heal without difficulty. She will see me back in 6 weeks for followup of this and her lap band but she knows to call me for any concerns.

## 2011-04-10 NOTE — Telephone Encounter (Signed)
Pt called requesting records [id number] treatment be sent also to Dr Eilene Ghazi. Christy advised.

## 2011-04-13 ENCOUNTER — Telehealth: Payer: Self-pay | Admitting: Emergency Medicine

## 2011-04-13 MED ORDER — MONTELUKAST SODIUM 10 MG PO TABS: 10.0000 mg | ORAL_TABLET | Freq: Every day | ORAL | Status: DC

## 2011-04-13 MED ORDER — MONTELUKAST SODIUM 10 MG PO TABS: ORAL_TABLET | ORAL | Status: DC

## 2011-04-13 NOTE — Telephone Encounter (Signed)
Spoke with pt and advised will refill singulair x 1 only and needs to sched appt as she is overdue. Sched pt appt for 04/29/11 at 3:30 pm. Refilled singulair x 1 only.

## 2011-04-29 ENCOUNTER — Ambulatory Visit (INDEPENDENT_AMBULATORY_CARE_PROVIDER_SITE_OTHER): Payer: Medicare Other | Admitting: Emergency Medicine

## 2011-04-29 ENCOUNTER — Encounter: Payer: Self-pay | Admitting: Emergency Medicine

## 2011-04-29 DIAGNOSIS — J309 Allergic rhinitis, unspecified: Secondary | ICD-10-CM

## 2011-04-29 NOTE — Progress Notes (Signed)
55 year old female patient has a known history of obesity, status post lap band surgery with significant weight loss. Patient also has a history of renal transplant on chronic immunosuppression with Imuran and prednisone. The patient has a history of sleep apnea now much improved after significant weight loss.  Still needs repeat PSG to prove that OSA has resolved. She has stopped using CPAP.   May 15, 2009 --Present for an acute office visit. Complains of coughing and chest tightness/congestion x 4wks. The cough has started a month ago and had one episode of bloody-tinged phlegm. Didn't take any OTC to help the cough. Has had some episode of vomitted after eating , this is chronic since lap band. Denies chest pain, abd. pain, melena. orthopnea, hemoptysis, fever, n/v/d, edema, headache   ROV 12/18/09 -- ROV to follow OSA/OHS, hx lap band sgy and GERD/reflux. Tells me that she was exposed to cleaning solution, bleach in the last week. Having some chest tightness, dry cough. Doing sinus rinses + nasonex. Taking omeprazole.   ROV 01/23/10 -- Acute visit. Has had fever, aches, head congestion, tight chest. Has had some dry cough, some nausea. Having trouble getting a deep breath.   July 30, 2010 --Presents for an acute office viist. Complains of sinus pressure/congestion, PND and sore throat, right ear discomfort, prod cough with yellow mucus, increased SOB, wheezing x 2 weeks . Has been doing well until last 2 weeks. Does not want to be sick for the holidays, family is coming in to visit.Denies chest pain, orthopnea, hemoptysis, fever, n/v/d, edema. Has alot of sinus congestion and pressure.   ROV 04/29/11 -- OSA/OHS, hx lap band sgy and GERD/reflux, often assoc w cough. She was hospitalized in Jan - Feb '12 for aspiration of gastric contents. She had to have the lap-band loosened because it was contributing to the aspiration. She gained 40 lbs and hasn't lost it. She presents today c/o sinus  congestion, HA beginning 1 month ago, some drainage to back of throat. Has been on her flonase and singulair. Doesn't use pulmicort anymore. No purulent mucous, no fever.   Gen: Pleasant, obese, has gained wt, in no distress,  normal affect  ENT post pharynx erythema, no purulence  Neck: No JVD, no TMG, no carotid bruits  Lungs: No use of accessory muscles, no dullness to percussion, clear without rales or rhonchi  Cardiovascular: RRR, heart sounds normal, no murmur or gallops, no peripheral edema  Musculoskeletal: No deformities, no cyanosis or clubbing  Neuro: alert, non focal  Skin: Warm, no lesions or rashes  Chronic allergic rhinitis Allergic flare.  - restart nasal saline washes - increase the flonase to 2 sprays each side 2x a day - stay on singulair - ROV 1 yr or prn

## 2011-04-29 NOTE — Patient Instructions (Signed)
Increase flonase to 2 sprays each side twice a day Restart your nasal saline washes every day Continue your singulair Try decongestants that contain either bromphenerimine or chlorphenerimine Follow up with Dr Lamonte Sakai if your symptoms aren't better in 2 -3 weeks Otherwise follow up in 1 year.

## 2011-04-29 NOTE — Assessment & Plan Note (Addendum)
Allergic flare.  - restart nasal saline washes - increase the flonase to 2 sprays each side 2x a day - stay on singulair - ROV 1 yr or prn

## 2011-05-05 ENCOUNTER — Telehealth: Payer: Self-pay | Admitting: Emergency Medicine

## 2011-05-05 NOTE — Telephone Encounter (Signed)
Pt states she is not feeling any better. Pt is going to come in tomorrow and see TP at 12:00. Pt could not come in today

## 2011-05-06 ENCOUNTER — Ambulatory Visit (INDEPENDENT_AMBULATORY_CARE_PROVIDER_SITE_OTHER): Payer: Medicare Other | Admitting: Adult Health

## 2011-05-06 ENCOUNTER — Encounter: Payer: Self-pay | Admitting: Adult Health

## 2011-05-06 ENCOUNTER — Other Ambulatory Visit: Payer: Medicare Other

## 2011-05-06 VITALS — BP 142/80 | HR 65 | Temp 98.2°F | Ht 61.0 in | Wt 199.2 lb

## 2011-05-06 DIAGNOSIS — J069 Acute upper respiratory infection, unspecified: Secondary | ICD-10-CM

## 2011-05-06 DIAGNOSIS — J029 Acute pharyngitis, unspecified: Secondary | ICD-10-CM

## 2011-05-06 LAB — BETA STREP SCREEN: Streptococcus, Group A Screen (Direct): NEGATIVE

## 2011-05-06 MED ORDER — CEFDINIR 300 MG PO CAPS: 300.0000 mg | ORAL_CAPSULE | Freq: Two times a day (BID) | ORAL | Status: AC

## 2011-05-06 NOTE — Patient Instructions (Signed)
Omnicef 300mg  Twice daily  For 7days  Salt water gargles as needed.  Tylenol As needed   flonase to 2 sprays each side twice a day Restart your nasal saline washes every day Continue your singulair Zyrtec 10mg  At bedtime  As needed  Drainage   Please contact office for sooner follow up if symptoms do not improve or worsen or seek emergency care

## 2011-05-06 NOTE — Assessment & Plan Note (Addendum)
Slow to resolve URI  Strep test pending.   Plan;   Omnicef 300mg  Twice daily  For 7days  Salt water gargles as needed.  Tylenol As needed   flonase to 2 sprays each side twice a day Restart your nasal saline washes every day Continue your singulair Zyrtec 10mg  At bedtime  As needed  Drainage   Please contact office for sooner follow up if symptoms do not improve or worsen or seek emergency care

## 2011-05-06 NOTE — Progress Notes (Signed)
55 year old female patient has a known history of obesity, status post lap band surgery with significant weight loss. Patient also has a history of renal transplant on chronic immunosuppression with Imuran and prednisone. The patient has a history of sleep apnea now much improved after significant weight loss.  Still needs repeat PSG to prove that OSA has resolved. She has stopped using CPAP.   May 15, 2009 --Present for an acute office visit. Complains of coughing and chest tightness/congestion x 4wks. The cough has started a month ago and had one episode of bloody-tinged phlegm. Didn't take any OTC to help the cough. Has had some episode of vomitted after eating , this is chronic since lap band. Denies chest pain, abd. pain, melena. orthopnea, hemoptysis, fever, n/v/d, edema, headache   ROV 12/18/09 -- ROV to follow OSA/OHS, hx lap band sgy and GERD/reflux. Tells me that she was exposed to cleaning solution, bleach in the last week. Having some chest tightness, dry cough. Doing sinus rinses + nasonex. Taking omeprazole.   ROV 01/23/10 -- Acute visit. Has had fever, aches, head congestion, tight chest. Has had some dry cough, some nausea. Having trouble getting a deep breath.   July 30, 2010 --Presents for an acute office viist. Complains of sinus pressure/congestion, PND and sore throat, right ear discomfort, prod cough with yellow mucus, increased SOB, wheezing x 2 weeks . Has been doing well until last 2 weeks. Does not want to be sick for the holidays, family is coming in to visit.Denies chest pain, orthopnea, hemoptysis, fever, n/v/d, edema. Has alot of sinus congestion and pressure.   ROV 04/29/11 -- OSA/OHS, hx lap band sgy and GERD/reflux, often assoc w cough. She was hospitalized in Jan - Feb '12 for aspiration of gastric contents. She had to have the lap-band loosened because it was contributing to the aspiration. She gained 40 lbs and hasn't lost it. She presents today c/o sinus  congestion, HA beginning 1 month ago, some drainage to back of throat. Has been on her flonase and singulair. Doesn't use pulmicort anymore. No purulent mucous, no fever.  >>saline nasal rinses   05/06/2011 Acute OV  Patient is no better since last visit with Dr. Lamonte Sakai. Patient c/o right ear pain, sore throat is worse since last visit.  Also feels fatigued. Throat is very sore . Singulair , flonase and saline rinses are not helping.  OTC is not helping.   ROS :  Constitutional:   No  weight loss, night sweats,  Fevers, chills, fatigue, or  lassitude.  HEENT:   No headaches,  Difficulty swallowing,  Tooth/dental problems, or  Sore throat,                + sneezing, itching, ear ache, nasal congestion, post nasal drip,   CV:  No chest pain,  Orthopnea, PND, swelling in lower extremities, anasarca, dizziness, palpitations, syncope.   GI  No heartburn, indigestion, abdominal pain, nausea, vomiting, diarrhea, change in bowel habits, loss of appetite, bloody stools.   Resp: No shortness of breath with exertion or at rest.  No excess mucus, no productive cough,  No non-productive cough,  No coughing up of blood.  No change in color of mucus.  No wheezing.  No chest wall deformity  Skin: no rash or lesions.  GU: no dysuria, change in color of urine, no urgency or frequency.  No flank pain, no hematuria   MS:  No joint pain or swelling.  No decreased range of motion.  No  back pain.  Psych:  No change in mood or affect. No depression or anxiety.  No memory loss.      PE :  Gen: Pleasant, obese, has gained wt, in no distress,  normal affect  ENT post pharynx erythema, no purulence  Neck: No JVD, no TMG, no carotid bruits  Lungs: No use of accessory muscles, no dullness to percussion, clear without rales or rhonchi  Cardiovascular: RRR, heart sounds normal, no murmur or gallops, no peripheral edema  Musculoskeletal: No deformities, no cyanosis or clubbing  Neuro: alert, non  focal  Skin: Warm, no lesions or rashes

## 2011-05-07 NOTE — Progress Notes (Signed)
Quick Note:  Spoke with pt and notified of results per Tammy Parrett, NP. Pt verbalized understanding and denied any questions.  ______ 

## 2011-05-18 LAB — CBC
Hemoglobin: 15.5 — ABNORMAL HIGH
MCHC: 33.9
Platelets: 215
RBC: 4.81
RDW: 14.2
WBC: 17.5 — ABNORMAL HIGH

## 2011-05-18 LAB — COMPREHENSIVE METABOLIC PANEL
ALT: 16
AST: 19
Albumin: 3.5
Alkaline Phosphatase: 118 — ABNORMAL HIGH
Chloride: 108
Potassium: 3.2 — ABNORMAL LOW
Sodium: 144
Total Protein: 7.1

## 2011-05-18 LAB — URINALYSIS, ROUTINE W REFLEX MICROSCOPIC
Bilirubin Urine: NEGATIVE
Hgb urine dipstick: NEGATIVE
Ketones, ur: NEGATIVE
Protein, ur: NEGATIVE
Specific Gravity, Urine: 1.023
Urobilinogen, UA: 0.2

## 2011-05-18 LAB — DIFFERENTIAL
Basophils Absolute: 0
Basophils Relative: 0
Eosinophils Absolute: 0.2
Eosinophils Relative: 1
Lymphocytes Relative: 24
Monocytes Absolute: 0.9
Monocytes Relative: 5
Neutro Abs: 5.4

## 2011-05-18 LAB — URINE MICROSCOPIC-ADD ON

## 2011-05-19 LAB — GLUCOSE, CAPILLARY
Glucose-Capillary: 108 — ABNORMAL HIGH
Glucose-Capillary: 111 — ABNORMAL HIGH
Glucose-Capillary: 117 — ABNORMAL HIGH
Glucose-Capillary: 134 — ABNORMAL HIGH
Glucose-Capillary: 141 — ABNORMAL HIGH
Glucose-Capillary: 214 — ABNORMAL HIGH
Glucose-Capillary: 70
Glucose-Capillary: 75

## 2011-05-19 LAB — CARBOXYHEMOGLOBIN
Carboxyhemoglobin: 1.1
Methemoglobin: 1
O2 Saturation: 74.3
O2 Saturation: 78.4
Total hemoglobin: 12.1 — ABNORMAL LOW
Total hemoglobin: 13.5

## 2011-05-19 LAB — CBC
HCT: 33.5 — ABNORMAL LOW
HCT: 34.3 — ABNORMAL LOW
HCT: 39.9
Hemoglobin: 13.3
MCV: 94.1
MCV: 94.4
Platelets: 138 — ABNORMAL LOW
Platelets: 143 — ABNORMAL LOW
Platelets: 153
Platelets: 153
Platelets: 179
RBC: 3.53 — ABNORMAL LOW
RBC: 3.56 — ABNORMAL LOW
RBC: 3.74 — ABNORMAL LOW
RDW: 14.3
WBC: 10
WBC: 10.2
WBC: 6.2
WBC: 9.9

## 2011-05-19 LAB — POCT I-STAT, CHEM 8
BUN: 9
Calcium, Ion: 1.17
Chloride: 103
Creatinine, Ser: 1.1
Glucose, Bld: 111 — ABNORMAL HIGH
HCT: 40

## 2011-05-19 LAB — DIFFERENTIAL
Eosinophils Absolute: 0
Eosinophils Absolute: 0
Eosinophils Relative: 0
Eosinophils Relative: 0
Eosinophils Relative: 1
Lymphocytes Relative: 8 — ABNORMAL LOW
Lymphs Abs: 0.3 — ABNORMAL LOW
Lymphs Abs: 0.5 — ABNORMAL LOW
Lymphs Abs: 0.8
Lymphs Abs: 0.9
Monocytes Relative: 3
Monocytes Relative: 9
Neutro Abs: 7.9 — ABNORMAL HIGH
Neutro Abs: 8.2 — ABNORMAL HIGH
Neutrophils Relative %: 79 — ABNORMAL HIGH

## 2011-05-19 LAB — BASIC METABOLIC PANEL
BUN: 12
BUN: 5 — ABNORMAL LOW
Calcium: 8.3 — ABNORMAL LOW
Chloride: 110
Creatinine, Ser: 0.72
GFR calc Af Amer: >60
GFR calc non Af Amer: >60
Glucose, Bld: 281 — ABNORMAL HIGH
Potassium: 3.1 — ABNORMAL LOW

## 2011-05-19 LAB — HEPATIC FUNCTION PANEL
ALT: 25
AST: 31
Alkaline Phosphatase: 117
Indirect Bilirubin: 0.5
Total Protein: 6.8

## 2011-05-19 LAB — CULTURE, BLOOD (ROUTINE X 2): Culture: NO GROWTH

## 2011-05-19 LAB — POCT I-STAT 3, ART BLOOD GAS (G3+)
TCO2: 24
pCO2 arterial: 36.2
pH, Arterial: 7.409 — ABNORMAL HIGH

## 2011-05-19 LAB — COMPREHENSIVE METABOLIC PANEL
ALT: 16
AST: 18
Albumin: 2.7 — ABNORMAL LOW
Alkaline Phosphatase: 94
BUN: 11
Chloride: 113 — ABNORMAL HIGH
Potassium: 4.2
Sodium: 141
Total Bilirubin: 1
Total Protein: 6

## 2011-05-19 LAB — URINALYSIS, ROUTINE W REFLEX MICROSCOPIC
Bilirubin Urine: NEGATIVE
Glucose, UA: NEGATIVE
Hgb urine dipstick: NEGATIVE
Ketones, ur: NEGATIVE
Specific Gravity, Urine: 1.019
pH: 7

## 2011-05-19 LAB — TYPE AND SCREEN
ABO/RH(D): O POS
Antibody Screen: NEGATIVE

## 2011-05-19 LAB — CARDIAC PANEL(CRET KIN+CKTOT+MB+TROPI)
Troponin I: 0.01
Troponin I: 0.02

## 2011-05-19 LAB — URINE CULTURE
Colony Count: NO GROWTH
Culture: NO GROWTH

## 2011-05-19 LAB — B-NATRIURETIC PEPTIDE (CONVERTED LAB): Pro B Natriuretic peptide (BNP): 229 — ABNORMAL HIGH

## 2011-05-19 LAB — POCT I-STAT 3, VENOUS BLOOD GAS (G3P V)
O2 Saturation: 73
pCO2, Ven: 37.7 — ABNORMAL LOW
pH, Ven: 7.416 — ABNORMAL HIGH
pO2, Ven: 38

## 2011-05-19 LAB — WOUND CULTURE

## 2011-05-19 LAB — MAGNESIUM: Magnesium: 1.9

## 2011-05-19 LAB — CK TOTAL AND CKMB (NOT AT ARMC): Relative Index: INVALID

## 2011-05-19 LAB — ABO/RH: ABO/RH(D): O POS

## 2011-05-19 LAB — VANCOMYCIN, TROUGH: Vancomycin Tr: 13.4

## 2011-05-19 LAB — AMYLASE: Amylase: 56

## 2011-05-19 LAB — TROPONIN I: Troponin I: 0.01

## 2011-05-19 LAB — LACTIC ACID, PLASMA: Lactic Acid, Venous: 0.9

## 2011-05-19 LAB — HEMOGLOBIN A1C: Hgb A1c MFr Bld: 5.3

## 2011-05-20 LAB — RAPID STREP SCREEN (MED CTR MEBANE ONLY): Streptococcus, Group A Screen (Direct): POSITIVE — AB

## 2011-05-22 ENCOUNTER — Encounter (INDEPENDENT_AMBULATORY_CARE_PROVIDER_SITE_OTHER): Payer: Medicare Other | Admitting: General Surgery

## 2011-05-25 ENCOUNTER — Telehealth: Payer: Self-pay | Admitting: Emergency Medicine

## 2011-05-25 MED ORDER — MONTELUKAST SODIUM 10 MG PO TABS: ORAL_TABLET | ORAL | Status: DC

## 2011-05-25 NOTE — Telephone Encounter (Signed)
I spoke with pt and she states she needed [facility] [address]. I advised will send rx for pt and nothing further was needed

## 2011-05-26 ENCOUNTER — Other Ambulatory Visit (INDEPENDENT_AMBULATORY_CARE_PROVIDER_SITE_OTHER): Payer: Self-pay

## 2011-05-26 DIAGNOSIS — L02419 Cutaneous abscess of limb, unspecified: Secondary | ICD-10-CM

## 2011-05-26 DIAGNOSIS — Z8614 Personal history of Methicillin resistant Staphylococcus aureus infection: Secondary | ICD-10-CM

## 2011-05-26 LAB — COMPREHENSIVE METABOLIC PANEL
ALT: 12
AST: 28
Albumin: 3.5
Alkaline Phosphatase: 78
Chloride: 108
GFR calc Af Amer: >60
Potassium: 3.9
Sodium: 139
Total Bilirubin: 1
Total Protein: 6.6

## 2011-05-26 LAB — DIFFERENTIAL
Basophils Absolute: 0.1
Eosinophils Absolute: 0 — ABNORMAL LOW
Eosinophils Relative: 0
Lymphocytes Relative: 4 — ABNORMAL LOW
Lymphs Abs: 0.6 — ABNORMAL LOW
Neutrophils Relative %: 89 — ABNORMAL HIGH

## 2011-05-26 LAB — POCT CARDIAC MARKERS
Myoglobin, poc: 64.8
Operator id: 146091

## 2011-05-26 LAB — CBC
Platelets: 201
RDW: 13.8
WBC: 13.6 — ABNORMAL HIGH

## 2011-05-26 LAB — I-STAT 8, (EC8 V) (CONVERTED LAB)
BUN: 13
Glucose, Bld: 108 — ABNORMAL HIGH
Hemoglobin: 15
Potassium: 3.7
Sodium: 139
TCO2: 23
pH, Ven: 7.453 — ABNORMAL HIGH

## 2011-05-26 LAB — POCT I-STAT CREATININE: Operator id: 146091

## 2011-05-26 MED ORDER — DOXYCYCLINE HYCLATE 100 MG PO TABS: 100.0000 mg | ORAL_TABLET | Freq: Two times a day (BID) | ORAL | Status: AC

## 2011-05-27 ENCOUNTER — Ambulatory Visit (INDEPENDENT_AMBULATORY_CARE_PROVIDER_SITE_OTHER): Payer: Medicare Other | Admitting: General Surgery

## 2011-05-27 ENCOUNTER — Encounter (INDEPENDENT_AMBULATORY_CARE_PROVIDER_SITE_OTHER): Payer: Self-pay | Admitting: General Surgery

## 2011-05-27 VITALS — BP 130/86 | HR 64 | Temp 97.2°F | Resp 20 | Ht 60.0 in | Wt 194.0 lb

## 2011-05-27 DIAGNOSIS — L03119 Cellulitis of unspecified part of limb: Secondary | ICD-10-CM

## 2011-05-27 DIAGNOSIS — L02416 Cutaneous abscess of left lower limb: Secondary | ICD-10-CM

## 2011-05-27 NOTE — Patient Instructions (Signed)
Remove packing tomorrow then washed daily in the shower or tub. Call if not steadily feeling better. Complete her antibiotics.

## 2011-05-27 NOTE — Progress Notes (Signed)
Chief complaint: Abscess left leg  History: Patient returns to the office with another typical staph abscess of her lower extremity. She has had a number of these in the past. It is been present for several days. He called and doxycycline yesterday.  In regards to her band she has had slow progressive weight loss and is down another 5 pounds.  Exam: Pertinent exam is limited to the skin. On the left posterior thigh is a 1 cm area of raised tender induration with several small pustules and there is about a 5-6 cm area of surrounding erythema.  Assessment plan: Subcutaneous abscess left lower extremity consistent with MRSA. Under local anesthesia this was unroofed and drained and packed with gauze. She is on doxycycline. She will wash this and the palmar shower daily. I'll plan to see her back for lap band followup in 3 months.

## 2011-08-03 ENCOUNTER — Other Ambulatory Visit (INDEPENDENT_AMBULATORY_CARE_PROVIDER_SITE_OTHER): Payer: Self-pay

## 2011-08-03 ENCOUNTER — Telehealth (INDEPENDENT_AMBULATORY_CARE_PROVIDER_SITE_OTHER): Payer: Self-pay | Admitting: General Surgery

## 2011-08-03 DIAGNOSIS — A4902 Methicillin resistant Staphylococcus aureus infection, unspecified site: Secondary | ICD-10-CM

## 2011-08-03 MED ORDER — DOXYCYCLINE HYCLATE 100 MG PO TABS: 100.0000 mg | ORAL_TABLET | Freq: Two times a day (BID) | ORAL | Status: DC

## 2011-08-03 NOTE — Telephone Encounter (Signed)
Pt called in stating she has a new boil on her stomach that is red and sore. It does not have a head, but patient has requested a prescription be issued if possible before it becomes infected. Pt stated she can be called back on [phone] and a message can be left on vmail if she does not pick up. Advised pt doctor will be contacted to make request and she will be contacted with response.

## 2011-08-04 ENCOUNTER — Encounter (INDEPENDENT_AMBULATORY_CARE_PROVIDER_SITE_OTHER): Payer: Self-pay | Admitting: General Surgery

## 2011-08-04 ENCOUNTER — Ambulatory Visit (INDEPENDENT_AMBULATORY_CARE_PROVIDER_SITE_OTHER): Payer: Medicare Other | Admitting: General Surgery

## 2011-08-04 VITALS — BP 128/78 | HR 64 | Temp 97.7°F | Resp 20 | Ht 60.0 in | Wt 198.4 lb

## 2011-08-04 DIAGNOSIS — L0390 Cellulitis, unspecified: Secondary | ICD-10-CM

## 2011-08-04 DIAGNOSIS — L0291 Cutaneous abscess, unspecified: Secondary | ICD-10-CM

## 2011-08-04 NOTE — Progress Notes (Signed)
History: Patient comes to the office with a history of recurrent staph skin and subcutaneous abscesses. About 2 days ago she noted an area of tenderness and redness on her right anterior abdominal wall. She called yesterday and we started her on p.o. doxycycline and asked her to come in today. She has no fever or chills. No other areas of concern.  Exam: She is afebrile General: Alert no distress Skin/abdomen: In the right mid abdomen is approximately 10 cm area of erythema with a central small umbilicated area of fluctuance about 1 cm.  Assessment and plan: Abscess of skin dissecting his tissue right abdomen almost certainly staph. Under local anesthesia unroofed this area and drained a small amount of purulent material and debrided little bit of necrotic subcutaneous tissue all back to healthy tissue. This was packed with gauze which were removed tomorrow and clean the area twice daily in a tub or shower. She is to finish her doxycycline prescription. She returned this is not steadily resolving. Plan to see her back in January for lap band followup.

## 2011-08-04 NOTE — Patient Instructions (Signed)
Remove packing tomorrow and clean and sober shower twice daily. Call as needed if the redness does not completely resolved.

## 2011-08-26 DIAGNOSIS — M19049 Primary osteoarthritis, unspecified hand: Secondary | ICD-10-CM | POA: Diagnosis not present

## 2011-09-03 IMAGING — CR DG CHEST 2V
2 series · 2 of 2 positions shown · non-contrast
Comparison: 05/15/2009

CLINICAL DATA: Cough/short of breath

CHEST - 2 VIEW

[view not recorded (1 of 2)]
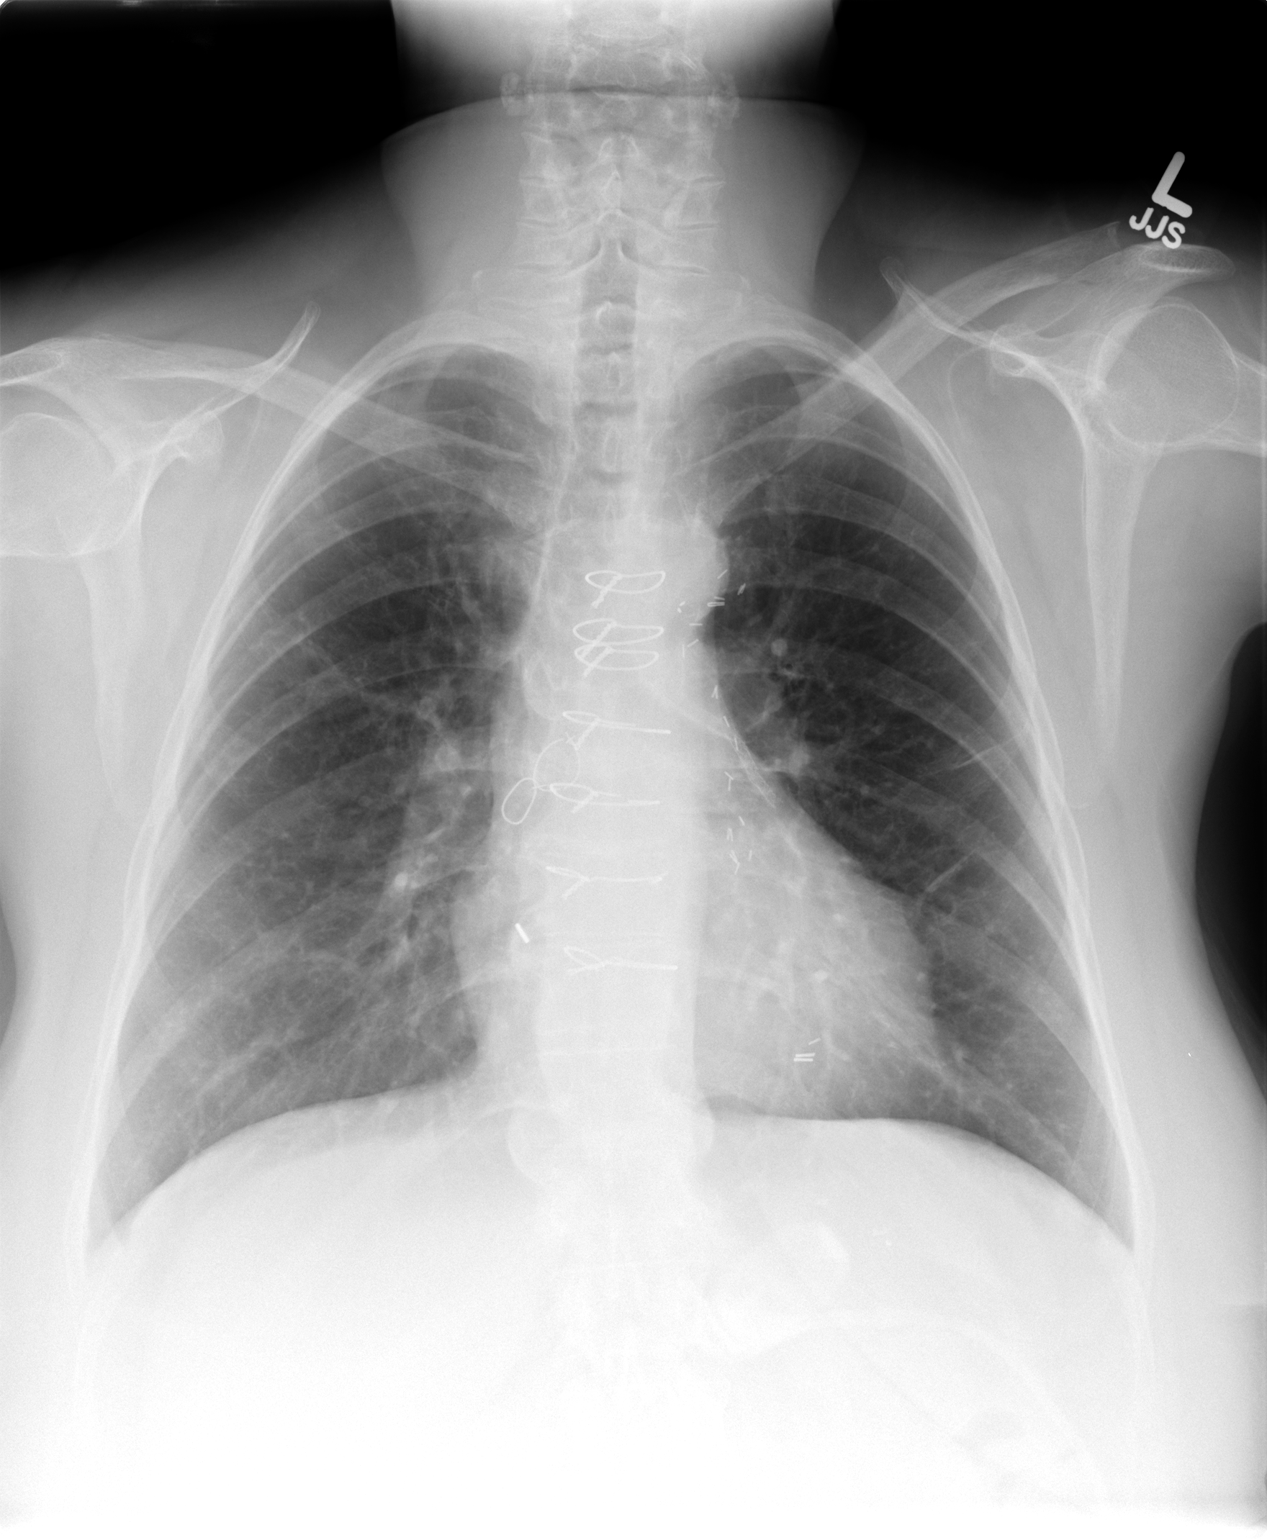

[view not recorded (2 of 2)]
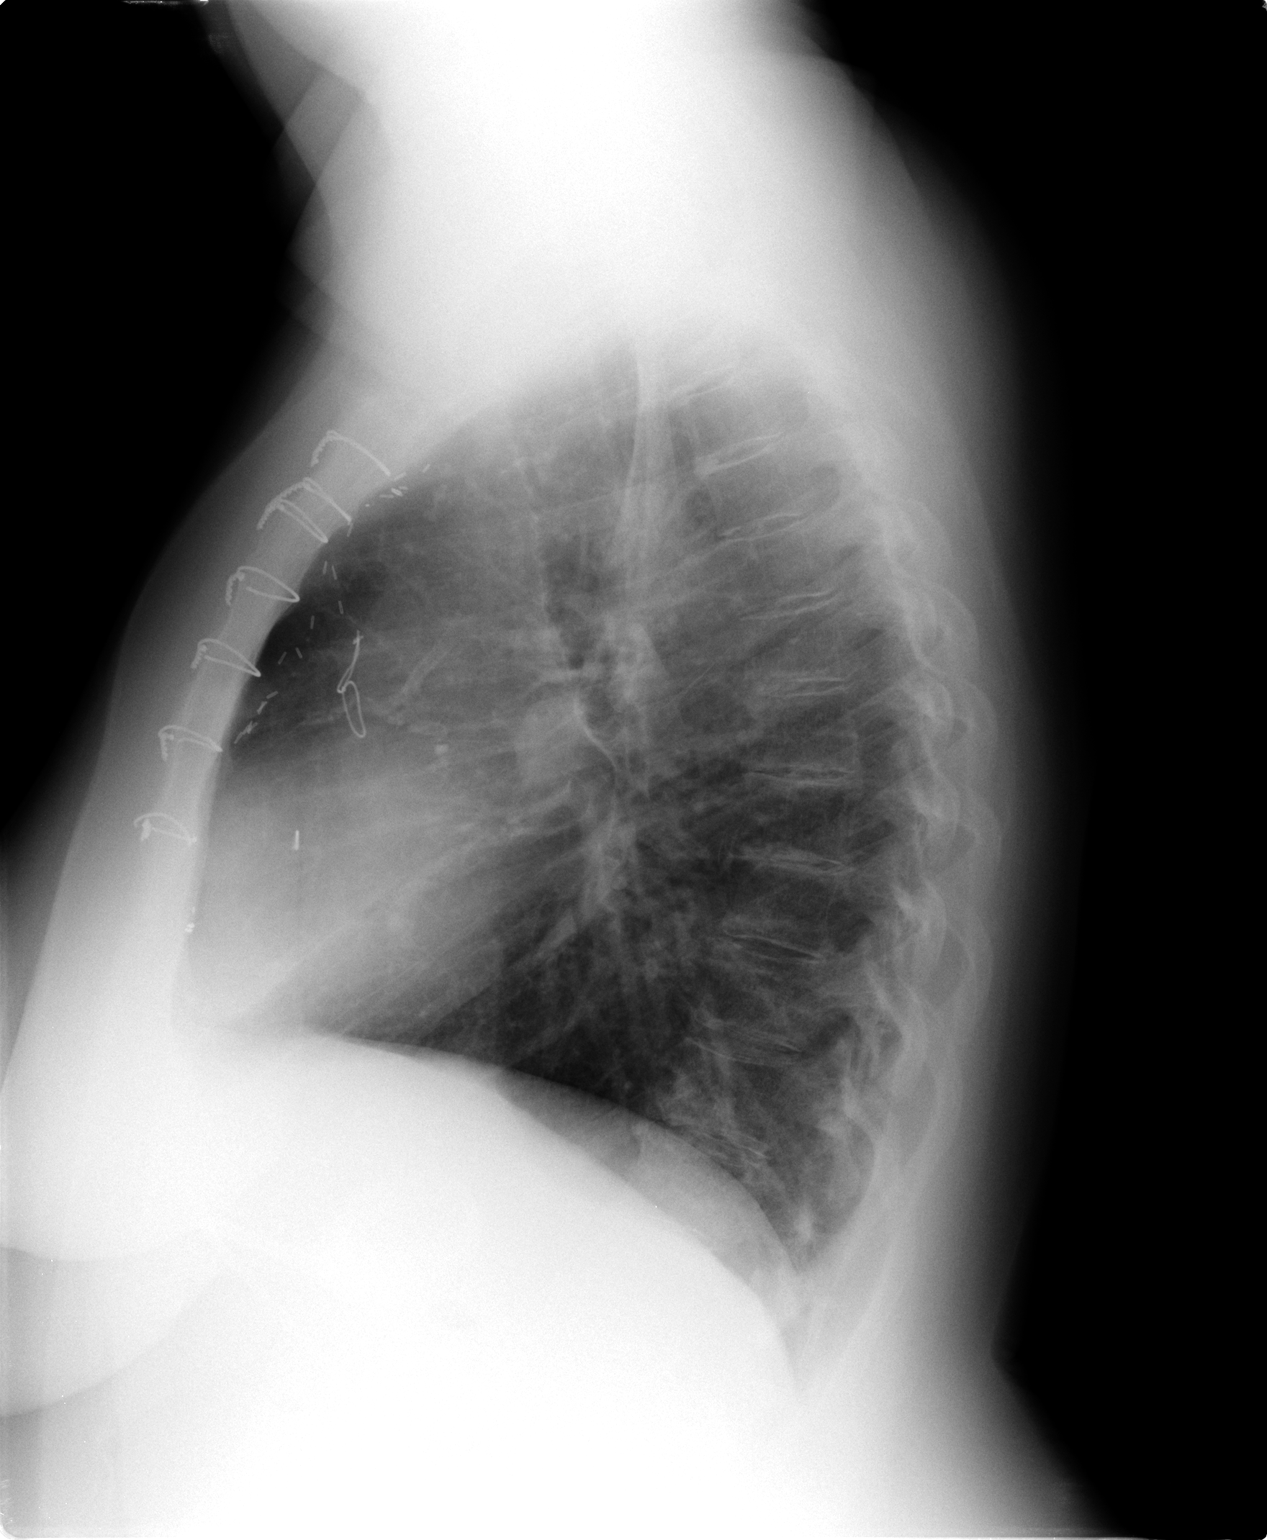

[2 of 2 positions shown; findings below may reference images not displayed]

FINDINGS: Post CABG.  Heart and vascularity normal.  Chronic lung
changes with left mid lung scarring as before.  No acute findings.
Soft tissues show the presence of a gastric band.
IMPRESSION: Chronic and postoperative changes - no active disease or interval
change.

## 2011-09-05 ENCOUNTER — Other Ambulatory Visit: Payer: Self-pay | Admitting: Emergency Medicine

## 2011-09-07 ENCOUNTER — Telehealth (INDEPENDENT_AMBULATORY_CARE_PROVIDER_SITE_OTHER): Payer: Self-pay | Admitting: General Surgery

## 2011-09-08 NOTE — Telephone Encounter (Signed)
Patient r/s for 10/08/11 @ 9:30 w/Dr. Excell Seltzer

## 2011-09-28 DIAGNOSIS — Z940 Kidney transplant status: Secondary | ICD-10-CM | POA: Diagnosis not present

## 2011-09-28 DIAGNOSIS — E119 Type 2 diabetes mellitus without complications: Secondary | ICD-10-CM | POA: Diagnosis not present

## 2011-09-28 DIAGNOSIS — D649 Anemia, unspecified: Secondary | ICD-10-CM | POA: Diagnosis not present

## 2011-09-28 DIAGNOSIS — E785 Hyperlipidemia, unspecified: Secondary | ICD-10-CM | POA: Diagnosis not present

## 2011-09-28 DIAGNOSIS — I10 Essential (primary) hypertension: Secondary | ICD-10-CM | POA: Diagnosis not present

## 2011-09-29 IMAGING — CR DG CHEST 2V
2 series · 2 of 2 positions shown · non-contrast
Comparison: 08/20/2010

CLINICAL DATA: Tachycardia.  Cold.  Chills.

CHEST - 2 VIEW

[w chest pa]
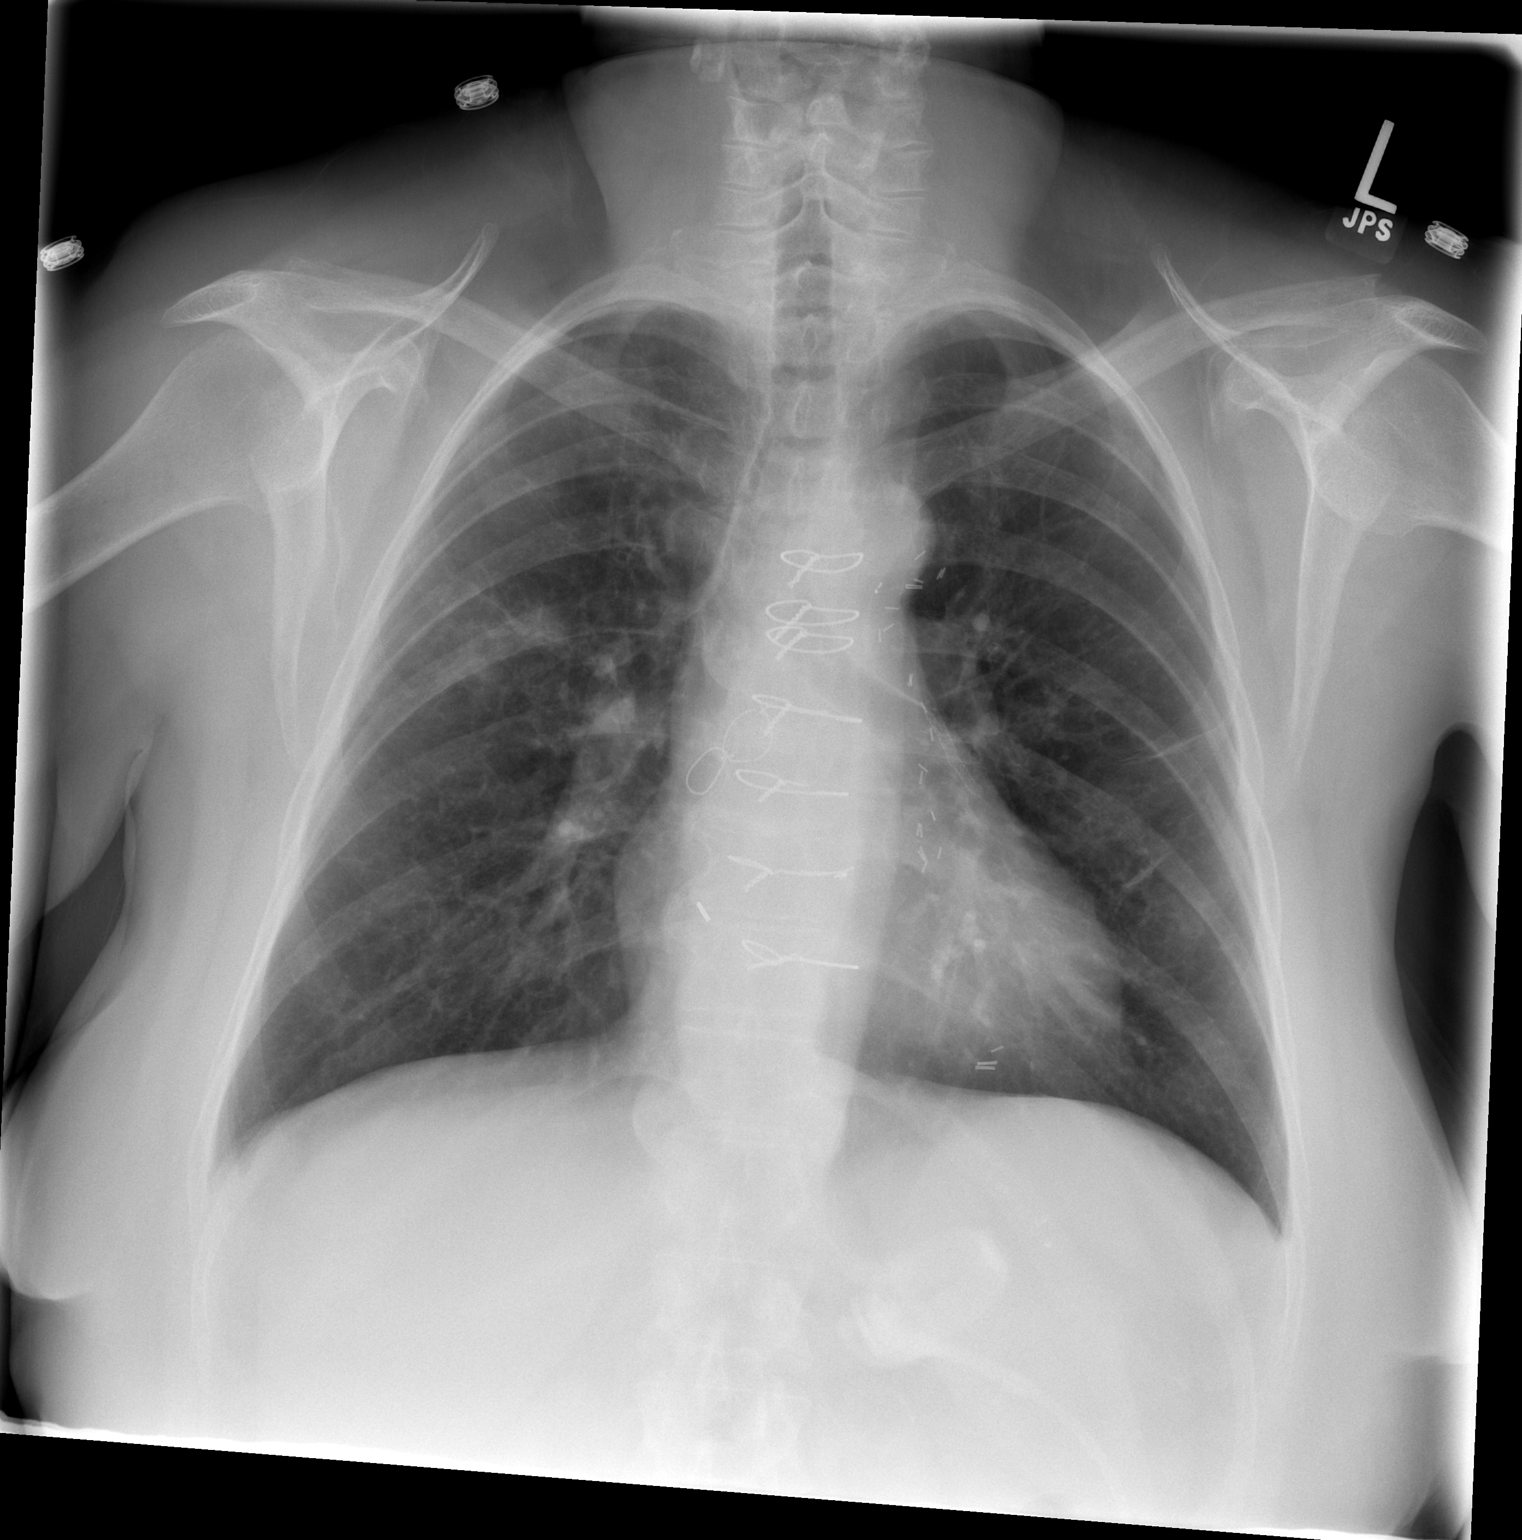

[w chest lat]
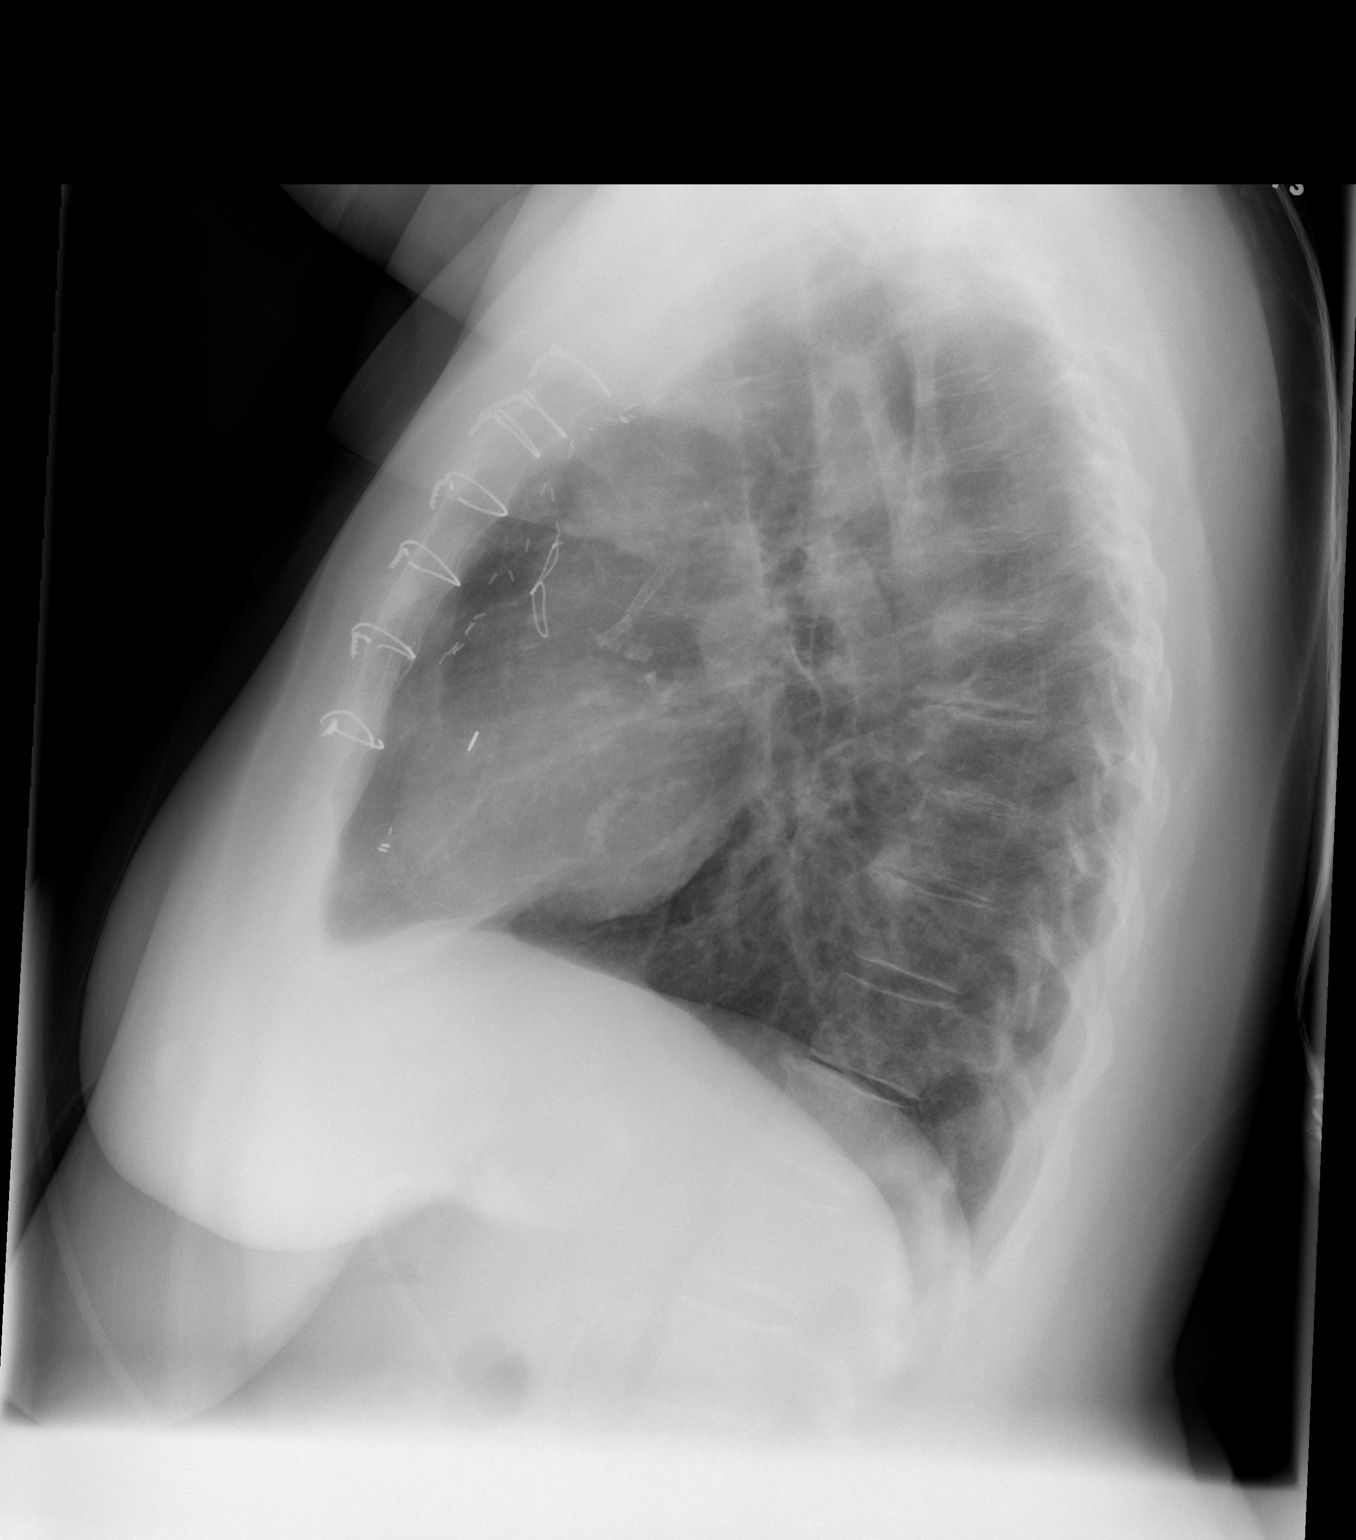

[2 of 2 positions shown; findings below may reference images not displayed]

FINDINGS: New focal airspace opacities seen in the right midlung.
Left lung remains clear. The cardiopericardial silhouette is within
normal limits for size.  The patient is status post CABG.  Lap band
identified over the left upper quadrant of the abdomen.
IMPRESSION: New focal airspace disease in the right midlung, compatible with
pneumonia.  Follow-up imaging is recommended to ensure resolution.

## 2011-09-30 IMAGING — CT CT CHEST W/O CM
4 series · 17 of 29 positions shown, 18 images · non-contrast
Comparison: None.

CLINICAL DATA: 2-month history of fatigue, intermittent fevers, and
malaise.  History of coronary artery disease post CABG.  History of
asthma and chronic bronchitis.  Prior gastric banding procedure.

CT CHEST WITHOUT CONTRAST 09/16/2010:
TECHNIQUE: Multidetector CT imaging of the chest was performed
following the standard protocol without IV contrast.

[Series 2: routine chest · axial · 0.70mm/px · z∈[-259,-119]mm · 4 of 56 slices shown, 5 images]
[im 14/56  mediastinal]
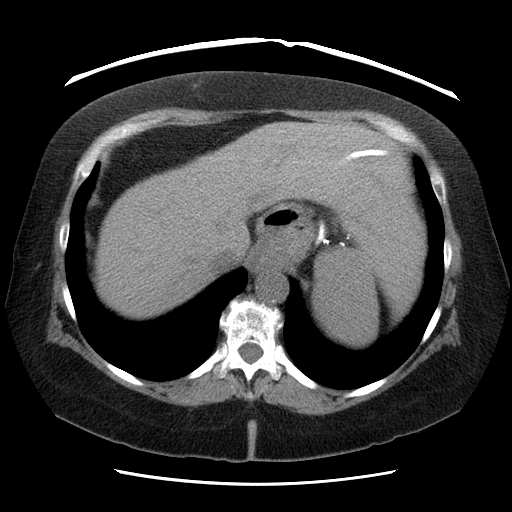
[im 14/56  lung]
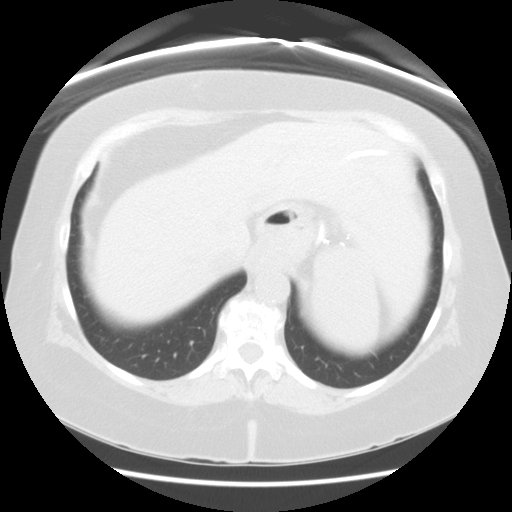
[im 28/56  lung]
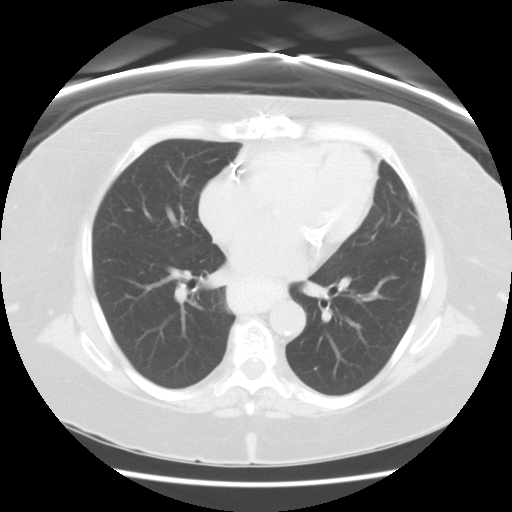
[im 31/56  lung]
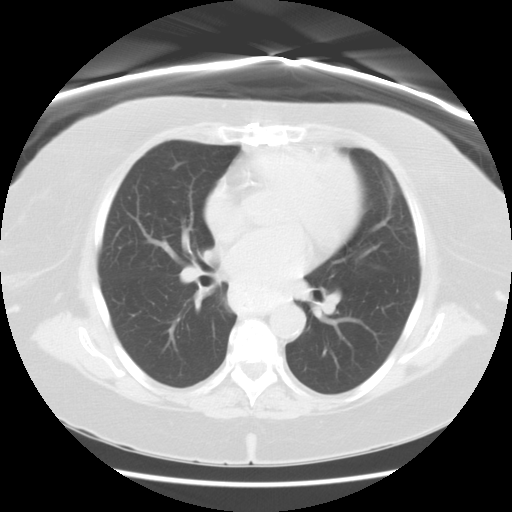
[im 42/56  lung]
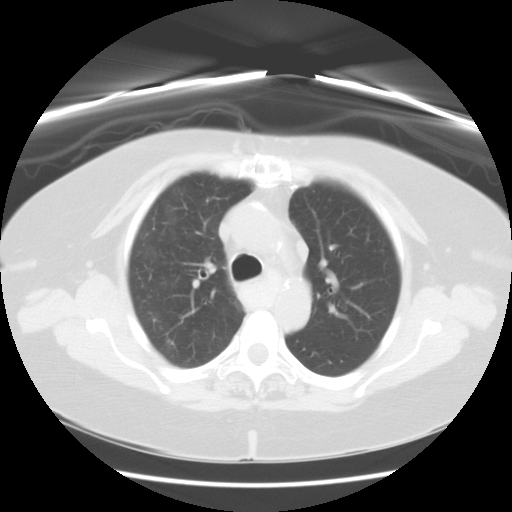

[Series 3: recon 2: routine chest · axial · 0.70mm/px · z∈[-234,-114]mm · 3 of 50 slices shown]
[im 13/50  lung]
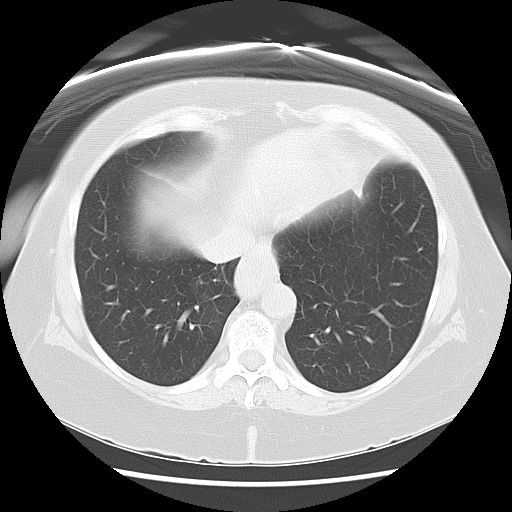
[im 25/50  lung]
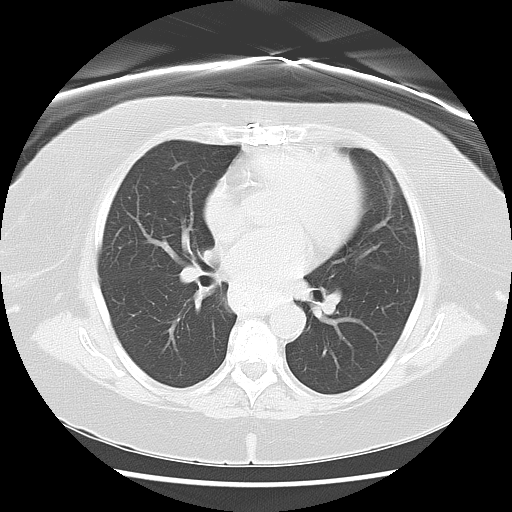
[im 37/50  lung]
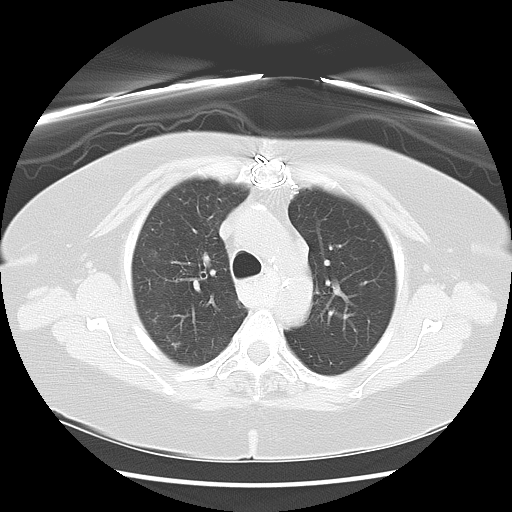

[Series 400: cor · coronal · 0.70mm/px · 2 of 89 slices shown]
[im 13/89  lung]
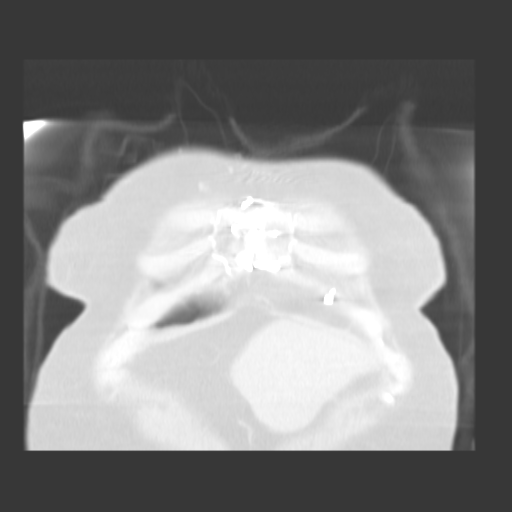
[im 26/89  lung]
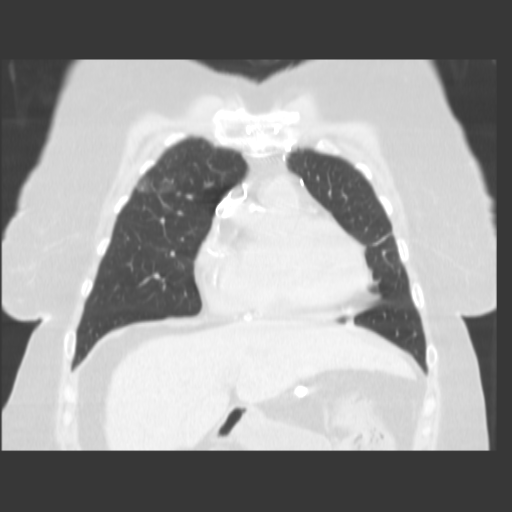

[Series 401: sag · sagittal · 0.70mm/px · 8 of 104 slices shown]
[im 12/104  lung]
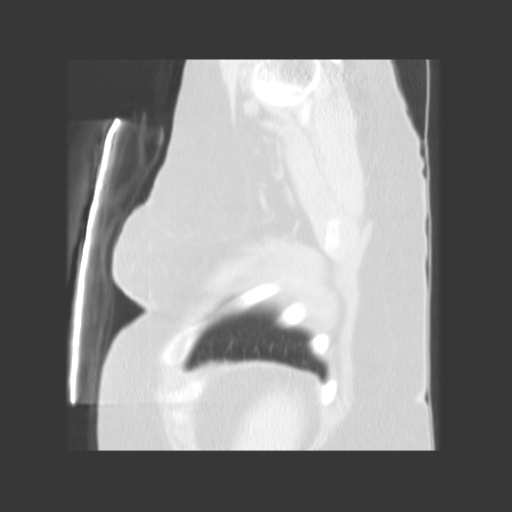
[im 23/104  lung]
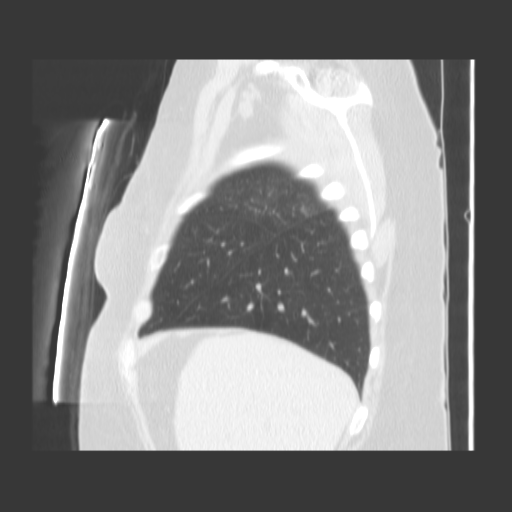
[im 35/104  lung]
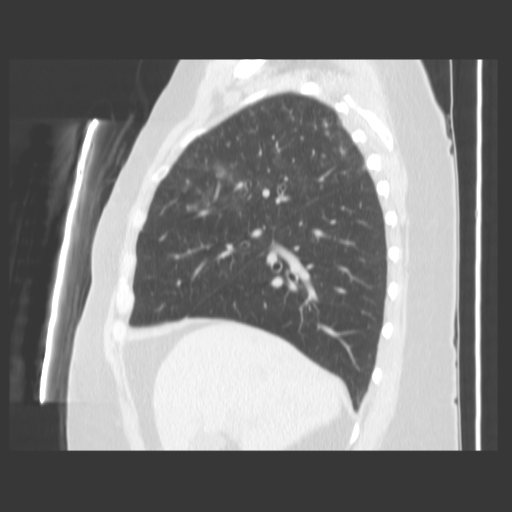
[im 46/104  lung]
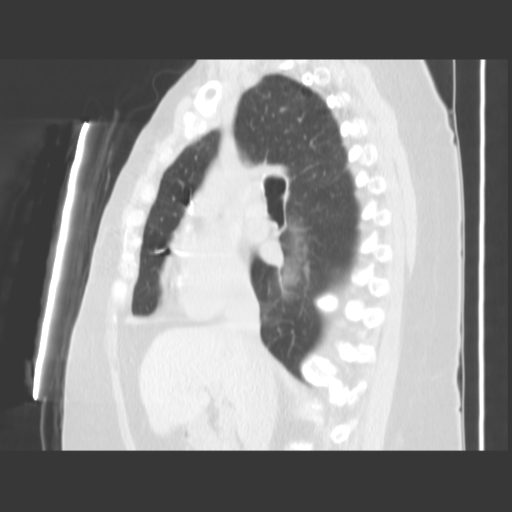
[im 58/104  lung]
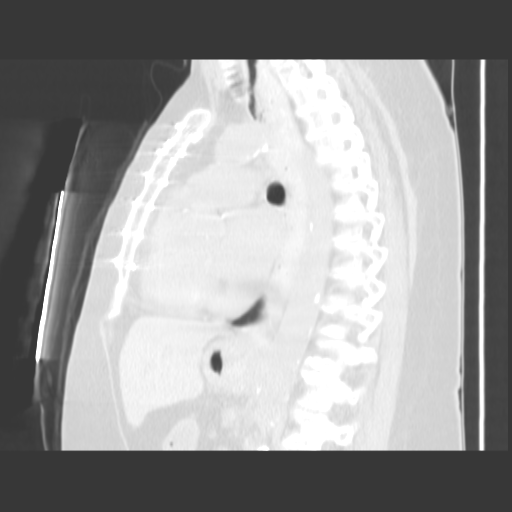
[im 69/104  lung]
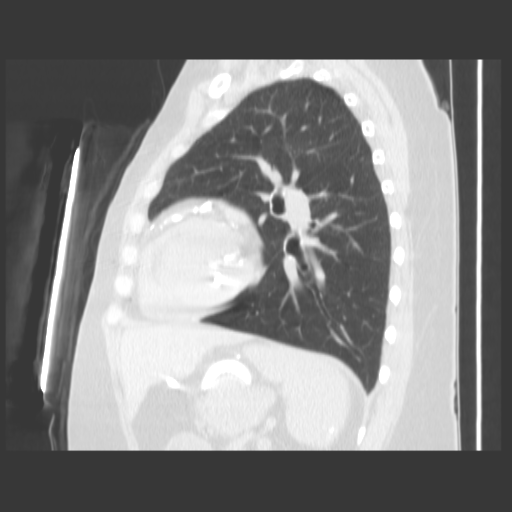
[im 81/104  lung]
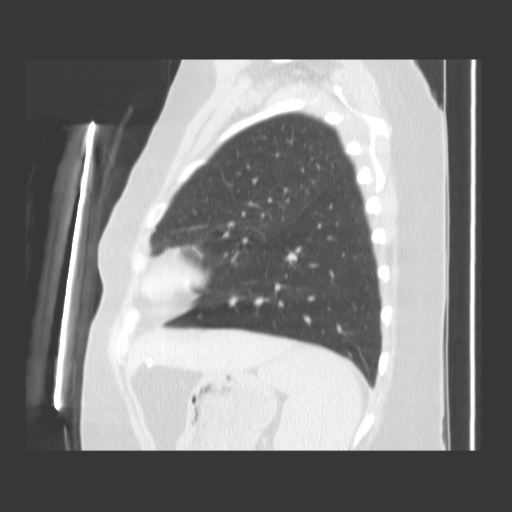
[im 92/104  lung]
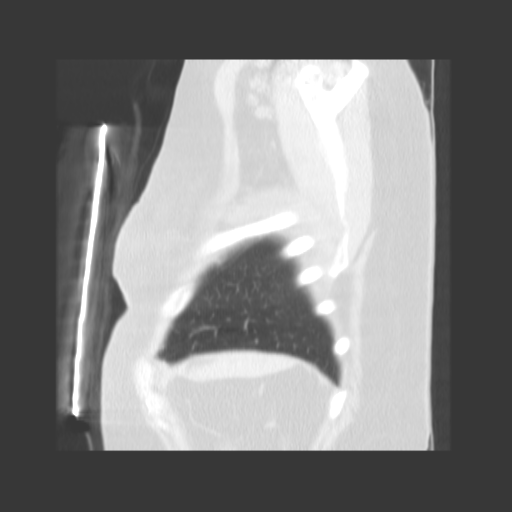

[17 of 29 positions shown; findings below may reference images not displayed]

FINDINGS: Prior sternotomy for CABG.  Heart mildly enlarged with
left ventricular predominance.  Mitral annular calcification.  No
pericardial effusion.  Thoracic and upper abdominal aortic
atherosclerosis without aneurysm.  Left sided SVC noted.

Large amount of fluid and food throughout the esophagus.  Gastric
band appropriately positioned in the proximal stomach.  No
significant mediastinal, hilar, or axillary lymphadenopathy.
Visualized thyroid gland  enlarged containing multiple nodules.

Patchy airspace opacities in the right upper lobe, some of which
have a ground-glass appearance.  Mild linear scarring or
atelectasis in the lingula.  Lungs otherwise clear.  No pulmonary
parenchymal nodules or masses.  No evidence of interstitial lung
disease.  No pleural effusions.

Visualized upper abdomen unremarkable for the unenhanced technique
apart from markedly atrophic kidneys.  Bone window images
demonstrate osteopenia and diffuse thoracic spondylosis with
ossification in the posterior longitudinal ligament in the mid
thoracic spine.
IMPRESSION: 1.  Mild cardiomegaly.  Mild patchy airspace opacities in the right
upper lobe which are likely due to pneumonia.  No acute
cardiopulmonary disease otherwise.
2.  Large amount of fluid and food in the throughout the entire
esophagus in this patient with previous gastric band procedure.
This means the patient is either overeating or the band may be too
tight and causing obstruction.
3.  Multinodular goiter.
4.  Atrophic kidneys consistent with chronic renal failure.

## 2011-10-08 ENCOUNTER — Ambulatory Visit (INDEPENDENT_AMBULATORY_CARE_PROVIDER_SITE_OTHER): Payer: Medicare Other | Admitting: General Surgery

## 2011-10-08 ENCOUNTER — Encounter (INDEPENDENT_AMBULATORY_CARE_PROVIDER_SITE_OTHER): Payer: Self-pay | Admitting: General Surgery

## 2011-10-08 DIAGNOSIS — Z4651 Encounter for fitting and adjustment of gastric lap band: Secondary | ICD-10-CM | POA: Diagnosis not present

## 2011-10-08 NOTE — Progress Notes (Signed)
Chief complaint: Followup lap band  History: Patient returns for followup of lap band placed 9 2005. She has had 2 cc in her band since last summer. She had had a period of over restriction and aspiration pneumonia prior to that. Her weight has been stable at right around 200 pounds. That represents a 78 pound weight loss from preop but she has gained about 30 pounds from a year ago. On reviewing her diet she is really eating mainly just carbohydrates in the form of serial and doesn't have much appetite for anything else. She does not seem to have symptoms of over restriction. No vomiting or regurgitation. We spent some time reviewing food choices and trying to find some high-protein solid food which is really what she needs to be concentrating on.  Exam: BP 138/82  Pulse 70  Temp(Src) 97.6 F (36.4 C) (Temporal)  Resp 18  Ht 5\' 1"  (1.549 m)  Wt 200 lb 9.6 oz (90.992 kg)  BMI 37.90 kg/m2  General: Appears well Abdomen: Soft and nontender port site looks fine  Assessment and plan: Status post lap band with stable weight should like to be down another 20 or 30 pounds. She does not appear to be restricted. We went ahead with a fill today of 0.2 cc. Again reviewed her diet choices which affected the main issue. She returned for lap band followup in 3 months.

## 2011-10-21 DIAGNOSIS — J329 Chronic sinusitis, unspecified: Secondary | ICD-10-CM | POA: Diagnosis not present

## 2011-10-21 DIAGNOSIS — Z940 Kidney transplant status: Secondary | ICD-10-CM | POA: Diagnosis not present

## 2011-10-21 DIAGNOSIS — J449 Chronic obstructive pulmonary disease, unspecified: Secondary | ICD-10-CM | POA: Diagnosis not present

## 2011-10-21 DIAGNOSIS — I10 Essential (primary) hypertension: Secondary | ICD-10-CM | POA: Diagnosis not present

## 2011-10-29 DIAGNOSIS — Z951 Presence of aortocoronary bypass graft: Secondary | ICD-10-CM | POA: Diagnosis not present

## 2011-10-29 DIAGNOSIS — E782 Mixed hyperlipidemia: Secondary | ICD-10-CM | POA: Diagnosis not present

## 2011-10-29 DIAGNOSIS — I2510 Atherosclerotic heart disease of native coronary artery without angina pectoris: Secondary | ICD-10-CM | POA: Diagnosis not present

## 2011-10-30 ENCOUNTER — Encounter (INDEPENDENT_AMBULATORY_CARE_PROVIDER_SITE_OTHER): Payer: Medicare Other | Admitting: General Surgery

## 2011-11-06 ENCOUNTER — Encounter (INDEPENDENT_AMBULATORY_CARE_PROVIDER_SITE_OTHER): Payer: Medicare Other | Admitting: General Surgery

## 2011-11-21 ENCOUNTER — Telehealth (INDEPENDENT_AMBULATORY_CARE_PROVIDER_SITE_OTHER): Payer: Self-pay | Admitting: General Surgery

## 2011-11-21 NOTE — Telephone Encounter (Signed)
Patient called, stated that Dr. Excell Seltzer has treated her for MRSA "boils" in past.  Now has early boil on thigh. Requested Rx.  [facility] [address] [phone]  Please call her Monday to be sure she is responding.  Edsel Petrin. Dalbert Batman, M.D., [facility] General and Minimally invasive Surgery Breast and Colorectal Surgery Office:   [phone] Pager:   [phone]

## 2011-11-26 ENCOUNTER — Encounter (INDEPENDENT_AMBULATORY_CARE_PROVIDER_SITE_OTHER): Payer: Self-pay

## 2011-12-23 ENCOUNTER — Ambulatory Visit (INDEPENDENT_AMBULATORY_CARE_PROVIDER_SITE_OTHER)
Admission: RE | Admit: 2011-12-23 | Discharge: 2011-12-23 | Disposition: A | Discharge status: Home / Self Care | Payer: Medicare Other | Source: Ambulatory Visit | Attending: Adult Health | Admitting: Adult Health

## 2011-12-23 ENCOUNTER — Ambulatory Visit (INDEPENDENT_AMBULATORY_CARE_PROVIDER_SITE_OTHER): Payer: Medicare Other | Admitting: Adult Health

## 2011-12-23 ENCOUNTER — Encounter: Payer: Self-pay | Admitting: Adult Health

## 2011-12-23 ENCOUNTER — Telehealth: Payer: Self-pay | Admitting: Adult Health

## 2011-12-23 VITALS — BP 122/80 | HR 70 | Temp 97.9°F | Ht 60.0 in | Wt 190.0 lb

## 2011-12-23 DIAGNOSIS — J209 Acute bronchitis, unspecified: Secondary | ICD-10-CM | POA: Diagnosis not present

## 2011-12-23 DIAGNOSIS — R0602 Shortness of breath: Secondary | ICD-10-CM | POA: Diagnosis not present

## 2011-12-23 DIAGNOSIS — R05 Cough: Secondary | ICD-10-CM | POA: Diagnosis not present

## 2011-12-23 MED ORDER — LEVALBUTEROL HCL 0.63 MG/3ML IN NEBU: 1.0000 | INHALATION_SOLUTION | RESPIRATORY_TRACT | Status: DC | PRN

## 2011-12-23 MED ORDER — CEFDINIR 300 MG PO CAPS: 300.0000 mg | ORAL_CAPSULE | Freq: Two times a day (BID) | ORAL | Status: AC

## 2011-12-23 MED ORDER — BUDESONIDE 0.5 MG/2ML IN SUSP: 0.5000 mg | Freq: Two times a day (BID) | RESPIRATORY_TRACT | Status: DC

## 2011-12-23 NOTE — Telephone Encounter (Signed)
Called spoke with patient who stated that the budesonide and xopenex that were sent to her local pharmacy are too expensive.  Pt has gotten these filled at [facility] in the past and pt has medicare so will print rx's for TP to sign and order placed for [facility].  Also, pt stated that the pharmacist told her that the xopenex has albuterol in it and can raise her HR.  Advised pt that xopenex is what is prescribed for cardiac patients in place of the albuterol > they are similar, yes, but the xopenex does not increase HR.  Pt okay with this and verbalized her understanding.  Also informed pt of her nml cxr results.  [facility] [address], [facility] and cancelled the pulmicort and xopenex.  Will sign off.

## 2011-12-23 NOTE — Patient Instructions (Addendum)
Follow up Dr. Lamonte Sakai  In 1 month and As needed   Omnicef 300mg  Twice daily  For 7days  Tylenol As needed   Nasal saline washes As needed   Zyrtec 10mg  At bedtime  As needed  Drainage   Please contact office for sooner follow up if symptoms do not improve or worsen or seek emergency care

## 2011-12-23 NOTE — Progress Notes (Signed)
56 year old female patient has a known history of obesity, status post lap band surgery with significant weight loss. Patient also has a history of renal transplant on chronic immunosuppression with Imuran and prednisone. The patient has a history of sleep apnea now much improved after significant weight loss.  Still needs repeat PSG to prove that OSA has resolved. She has stopped using CPAP.   May 15, 2009 --Present for an acute office visit. Complains of coughing and chest tightness/congestion x 4wks. The cough has started a month ago and had one episode of bloody-tinged phlegm. Didn't take any OTC to help the cough. Has had some episode of vomitted after eating , this is chronic since lap band. Denies chest pain, abd. pain, melena. orthopnea, hemoptysis, fever, n/v/d, edema, headache   ROV 12/18/09 -- ROV to follow OSA/OHS, hx lap band sgy and GERD/reflux. Tells me that she was exposed to cleaning solution, bleach in the last week. Having some chest tightness, dry cough. Doing sinus rinses + nasonex. Taking omeprazole.   ROV 01/23/10 -- Acute visit. Has had fever, aches, head congestion, tight chest. Has had some dry cough, some nausea. Having trouble getting a deep breath.   July 30, 2010 --Presents for an acute office viist. Complains of sinus pressure/congestion, PND and sore throat, right ear discomfort, prod cough with yellow mucus, increased SOB, wheezing x 2 weeks . Has been doing well until last 2 weeks. Does not want to be sick for the holidays, family is coming in to visit.Denies chest pain, orthopnea, hemoptysis, fever, n/v/d, edema. Has alot of sinus congestion and pressure.   ROV 04/29/11 -- OSA/OHS, hx lap band sgy and GERD/reflux, often assoc w cough. She was hospitalized in Jan - Feb '12 for aspiration of gastric contents. She had to have the lap-band loosened because it was contributing to the aspiration. She gained 40 lbs and hasn't lost it. She presents today c/o sinus  congestion, HA beginning 1 month ago, some drainage to back of throat. Has been on her flonase and singulair. Doesn't use pulmicort anymore. No purulent mucous, no fever.  >>saline nasal rinses   05/06/11  Acute OV  Patient is no better since last visit with Dr. Lamonte Sakai. Patient c/o right ear pain, sore throat is worse since last visit.  Also feels fatigued. Throat is very sore . Singulair , flonase and saline rinses are not helping.  OTC is not helping.  >>Omnicef rx    12/23/2011 Acute OV Patient c/o ear pain in both ears, sore throat, chest tightness, chest pain mostly in her back, cough with thick stringy light yellow mucus, and wheezing that is mostly at night x 2 weeks. Just finished Doxy for boil.  CXR today shows  No acute process.  No fever or hemoptysis     ROS :  Constitutional:   No  weight loss, night sweats,  Fevers, chills, fatigue, or  lassitude.  HEENT:   No headaches,  Difficulty swallowing,  Tooth/dental problems, or  Sore throat,                + sneezing, itching, ear ache, nasal congestion, post nasal drip,   CV:  No chest pain,  Orthopnea, PND, swelling in lower extremities, anasarca, dizziness, palpitations, syncope.   GI  No heartburn, indigestion, abdominal pain, nausea, vomiting, diarrhea, change in bowel habits, loss of appetite, bloody stools.   Resp: No shortness of breath with exertion or at rest.  No excess mucus, no productive cough,  No non-productive  cough,  No coughing up of blood.  No change in color of mucus.  No wheezing.  No chest wall deformity  Skin: no rash or lesions.  GU: no dysuria, change in color of urine, no urgency or frequency.  No flank pain, no hematuria   MS:  No joint pain or swelling.  No decreased range of motion.  No back pain.  Psych:  No change in mood or affect. No depression or anxiety.  No memory loss.      PE :  Gen: Pleasant, obese, has gained wt, in no distress,  normal affect  ENT post pharynx erythema, no  purulence  Neck: No JVD, no TMG, no carotid bruits  Lungs: No use of accessory muscles, no dullness to percussion, clear without rales or rhonchi  Cardiovascular: RRR, heart sounds normal, no murmur or gallops, no peripheral edema  Musculoskeletal: No deformities, no cyanosis or clubbing  Neuro: alert, non focal  Skin: Warm, no lesions or rashes

## 2011-12-24 NOTE — Telephone Encounter (Signed)
Error - dme does not supply xopenex.  The rx was voided and placed in shred bin.  Pt is aware this med will have to be provided thru her local pharmacy.

## 2011-12-24 NOTE — Assessment & Plan Note (Signed)
Follow up Dr. Lamonte Sakai  In 1 month and As needed   Omnicef 300mg  Twice daily  For 7days  Tylenol As needed   Nasal saline washes As needed   Zyrtec 10mg  At bedtime  As needed  Drainage   Please contact office for sooner follow up if symptoms do not improve or worsen or seek emergency care

## 2011-12-28 DIAGNOSIS — E119 Type 2 diabetes mellitus without complications: Secondary | ICD-10-CM | POA: Diagnosis not present

## 2011-12-28 DIAGNOSIS — E669 Obesity, unspecified: Secondary | ICD-10-CM | POA: Diagnosis not present

## 2011-12-28 DIAGNOSIS — E785 Hyperlipidemia, unspecified: Secondary | ICD-10-CM | POA: Diagnosis not present

## 2011-12-28 DIAGNOSIS — I10 Essential (primary) hypertension: Secondary | ICD-10-CM | POA: Diagnosis not present

## 2011-12-28 DIAGNOSIS — Z940 Kidney transplant status: Secondary | ICD-10-CM | POA: Diagnosis not present

## 2011-12-30 ENCOUNTER — Telehealth: Payer: Self-pay | Admitting: Emergency Medicine

## 2011-12-30 ENCOUNTER — Telehealth: Payer: Self-pay | Admitting: Adult Health

## 2011-12-30 ENCOUNTER — Encounter (INDEPENDENT_AMBULATORY_CARE_PROVIDER_SITE_OTHER): Payer: Medicare Other | Admitting: General Surgery

## 2011-12-30 DIAGNOSIS — J449 Chronic obstructive pulmonary disease, unspecified: Secondary | ICD-10-CM

## 2011-12-30 MED ORDER — BUDESONIDE 0.5 MG/2ML IN SUSP: 0.5000 mg | Freq: Two times a day (BID) | RESPIRATORY_TRACT | Status: DC

## 2011-12-30 MED ORDER — LEVALBUTEROL HCL 0.63 MG/3ML IN NEBU: 1.0000 | INHALATION_SOLUTION | RESPIRATORY_TRACT | Status: DC | PRN

## 2011-12-30 NOTE — Telephone Encounter (Signed)
I spoke with pt and she is needing her budesonide and xopenex nebs sent to [address]. States medicare does not cover this through her dme. Rx's have been sent and nothing further was needed

## 2011-12-30 NOTE — Telephone Encounter (Signed)
Pt has medicare and she states her DME would not cover her meds and that [facility] was telling [facility] was not [facility] either. I called [facility] and asked if they filed under Part B, which they had not. They tried to file it while I was on the phone with them and it would not accept any diagnosis given for the pt.    It looks like [employer] was working on this issue as well but she has left for the day. I will forward message to [facility] to address in AM.   [employer] which DME company has been supplying this pt neb meds? Pt states that [employer] will not cover her ned meds and they are being denied at [facility] as well. Please advise. Pineland Bing, CMA

## 2011-12-31 ENCOUNTER — Encounter: Payer: Self-pay | Admitting: *Deleted

## 2011-12-31 MED ORDER — BUDESONIDE 0.5 MG/2ML IN SUSP: 0.5000 mg | Freq: Two times a day (BID) | RESPIRATORY_TRACT | Status: DC

## 2011-12-31 NOTE — Telephone Encounter (Signed)
i will send pulmicort to [facility] so i need a new rx thanks xopenex has to go through pharmacy

## 2011-12-31 NOTE — Telephone Encounter (Signed)
Rx given to libby and order sent for neb mebs through DME.

## 2012-01-04 ENCOUNTER — Telehealth: Payer: Self-pay | Admitting: Emergency Medicine

## 2012-01-04 MED ORDER — LEVOFLOXACIN 500 MG PO TABS: 500.0000 mg | ORAL_TABLET | Freq: Every day | ORAL | Status: AC

## 2012-01-04 NOTE — Telephone Encounter (Signed)
Spoke with pt and notified of recs per TP. She verbalized understanding and states nothing further needed. Rx was called to pharm.

## 2012-01-04 NOTE — Telephone Encounter (Signed)
Called, spoke with pt who c/o feeling weak, having chest pain when inhaling, wheezing, chest tightness, chest congestion, body aches, sweats some last night, and coughing some but not a lot.  Cough is prod with stringy mucus - mucus is in between yellow and white.  SOB is at baseline.  Reports she did get slightly better while on Omnicef but symptoms worsened on Friday.  She is taking tylenol sever cold, Flonase, Singulair, using nebs as directed, and alka seltzer.  Offered OV - Pt declined and requesting TP send in another round of abx - preferably something other than omnicef.    [facility] [address]  Allergies verified  Allergies  Allergen Reactions  . Codeine   . Erythromycin   . Hydromorphone Hcl     REACTION: nausea  . Nalbuphine   . Sulfonamide Derivatives   . Tetracycline     Dr. Lamonte Sakai is listed as 11 pm Elink last night and today.  Tammy P seen pt last, and pt asking for her recs on this.  Tammy, pls advise.  Thank you.

## 2012-01-04 NOTE — Telephone Encounter (Signed)
Levaquin 500mg  daily #7  Take as directed.  Ov if not better  Please contact office for sooner follow up if symptoms do not improve or worsen or seek emergency care

## 2012-01-06 DIAGNOSIS — M25529 Pain in unspecified elbow: Secondary | ICD-10-CM | POA: Diagnosis not present

## 2012-01-06 DIAGNOSIS — M7710 Lateral epicondylitis, unspecified elbow: Secondary | ICD-10-CM | POA: Diagnosis not present

## 2012-01-06 DIAGNOSIS — IMO0001 Reserved for inherently not codable concepts without codable children: Secondary | ICD-10-CM | POA: Diagnosis not present

## 2012-01-06 DIAGNOSIS — M19049 Primary osteoarthritis, unspecified hand: Secondary | ICD-10-CM | POA: Diagnosis not present

## 2012-01-08 ENCOUNTER — Other Ambulatory Visit: Payer: Self-pay | Admitting: Emergency Medicine

## 2012-01-12 ENCOUNTER — Telehealth: Payer: Self-pay | Admitting: Emergency Medicine

## 2012-01-12 MED ORDER — FLUCONAZOLE 100 MG PO TABS: ORAL_TABLET | ORAL | Status: DC

## 2012-01-12 NOTE — Telephone Encounter (Signed)
Spoke with pt ,reminded her to brush/ rinse and gargle after inhaler an nebs,  [facility] [address]

## 2012-01-12 NOTE — Telephone Encounter (Signed)
Spoke with pt she has finished abx states has some chest tightness but feeling better. Requesting  rx for thursh.  Allergies  Allergen Reactions  . Codeine   . Erythromycin   . Hydromorphone Hcl     REACTION: nausea  . Nalbuphine   . Sulfonamide Derivatives   . Tetracycline    TP please advise  Thank you

## 2012-01-12 NOTE — Telephone Encounter (Signed)
Diflucan 100mg  2 po first day then 1 daily x 7 days .  # 8 , no refills  Remind her to brush/rinse and gargle after inhaler and nebs.

## 2012-02-05 ENCOUNTER — Ambulatory Visit (INDEPENDENT_AMBULATORY_CARE_PROVIDER_SITE_OTHER): Payer: Medicare Other | Admitting: Emergency Medicine

## 2012-02-05 ENCOUNTER — Encounter: Payer: Self-pay | Admitting: Emergency Medicine

## 2012-02-05 VITALS — BP 156/100 | HR 89 | Temp 97.7°F | Ht 61.0 in | Wt 195.0 lb

## 2012-02-05 DIAGNOSIS — R05 Cough: Secondary | ICD-10-CM | POA: Diagnosis not present

## 2012-02-05 DIAGNOSIS — R059 Cough, unspecified: Secondary | ICD-10-CM

## 2012-02-05 NOTE — Progress Notes (Signed)
56 year old female patient has a known history of obesity, status post lap band surgery with significant weight loss. Patient also has a history of renal transplant on chronic immunosuppression with Imuran and prednisone. The patient has a history of sleep apnea now much improved after significant weight loss.  Still needs repeat PSG to prove that OSA has resolved. She has stopped using CPAP.   May 15, 2009 --Present for an acute office visit. Complains of coughing and chest tightness/congestion x 4wks. The cough has started a month ago and had one episode of bloody-tinged phlegm. Didn't take any OTC to help the cough. Has had some episode of vomitted after eating , this is chronic since lap band. Denies chest pain, abd. pain, melena. orthopnea, hemoptysis, fever, n/v/d, edema, headache   ROV 12/18/09 -- ROV to follow OSA/OHS, hx lap band sgy and GERD/reflux. Tells me that she was exposed to cleaning solution, bleach in the last week. Having some chest tightness, dry cough. Doing sinus rinses + nasonex. Taking omeprazole.   ROV 01/23/10 -- Acute visit. Has had fever, aches, head congestion, tight chest. Has had some dry cough, some nausea. Having trouble getting a deep breath.   July 30, 2010 --Presents for an acute office viist. Complains of sinus pressure/congestion, PND and sore throat, right ear discomfort, prod cough with yellow mucus, increased SOB, wheezing x 2 weeks . Has been doing well until last 2 weeks. Does not want to be sick for the holidays, family is coming in to visit.Denies chest pain, orthopnea, hemoptysis, fever, n/v/d, edema. Has alot of sinus congestion and pressure.   ROV 04/29/11 -- OSA/OHS, hx lap band sgy and GERD/reflux, often assoc w cough. She was hospitalized in Jan - Feb '12 for aspiration of gastric contents. She had to have the lap-band loosened because it was contributing to the aspiration. She gained 40 lbs and hasn't lost it. She presents today c/o sinus  congestion, HA beginning 1 month ago, some drainage to back of throat. Has been on her flonase and singulair. Doesn't use pulmicort anymore. No purulent mucous, no fever.  >>saline nasal rinses   05/06/11  Acute OV  Patient is no better since last visit with Dr. Lamonte Sakai. Patient c/o right ear pain, sore throat is worse since last visit.  Also feels fatigued. Throat is very sore . Singulair , flonase and saline rinses are not helping.  OTC is not helping.  >>Omnicef rx   Acute OV 12/23/11 --  Patient c/o ear pain in both ears, sore throat, chest tightness, chest pain mostly in her back, cough with thick stringy light yellow mucus, and wheezing that is mostly at night x 2 weeks. Just finished Doxy for boil.  CXR today shows  No acute process.  No fever or hemoptysis   ROV 02/05/12 -- OSA/OHS, hx lap band sgy and GERD/reflux, often assoc w cough. Treated for persistent cough and drainage as above. She is still experiencing some drainage, tolerating sinus washes and flonase. Still has frequent reflux/emesis - almost certainly causing UA irritation. [facility] follows her lap-band.  Took diflucan for thrush 2 weeks ago.  She is having pain on inspiration, especially when she lays down supine.    PE :  Filed Vitals:   02/05/12 1458  BP: 156/100  Pulse: 89  Temp: 97.7 F (36.5 C)   Gen: Pleasant, obese, has gained wt, in no distress,  normal affect  ENT post pharynx erythema, no purulence  Neck: No JVD, no TMG, no carotid bruits  Lungs: No use of accessory muscles, no dullness to percussion, clear without rales or rhonchi  Cardiovascular: RRR, heart sounds normal, no murmur or gallops, no peripheral edema  Musculoskeletal: No deformities, no cyanosis or clubbing  Neuro: alert, non focal  Skin: Warm, no lesions or rashes  COUGH, CHRONIC - pt has daily emesis due to her lap-band. This is what is causing her cough, UA irritation. No evidence for bronchitis at this time

## 2012-02-05 NOTE — Assessment & Plan Note (Signed)
-   pt has daily emesis due to her lap-band. This is what is causing her cough, UA irritation. No evidence for bronchitis at this time

## 2012-02-05 NOTE — Patient Instructions (Addendum)
Please continue your current medications Follow with dr Excell Seltzer as planned Follow with Dr Lamonte Sakai in 3 months or sooner if you have any problems.

## 2012-03-01 ENCOUNTER — Encounter (INDEPENDENT_AMBULATORY_CARE_PROVIDER_SITE_OTHER): Payer: Self-pay

## 2012-03-03 ENCOUNTER — Encounter (INDEPENDENT_AMBULATORY_CARE_PROVIDER_SITE_OTHER): Payer: Medicare Other | Admitting: General Surgery

## 2012-03-07 ENCOUNTER — Telehealth (INDEPENDENT_AMBULATORY_CARE_PROVIDER_SITE_OTHER): Payer: Self-pay | Admitting: General Surgery

## 2012-03-07 NOTE — Telephone Encounter (Signed)
Pt calling to report two more probable staph sores are developing on her buttocks. Today they are palpable and hard to the touch.  She wanted to know if Dr. Excell Seltzer wanted to see her again or just call in antibix for her?  She uses [facility]:  [phone].

## 2012-03-08 NOTE — Telephone Encounter (Signed)
Phoned in Doxycyline 100 mg, # 14, 1 po BID, no refill.  Called pt to update her and instruct to call for appt if abscess needs to be lanced.

## 2012-03-18 ENCOUNTER — Ambulatory Visit (INDEPENDENT_AMBULATORY_CARE_PROVIDER_SITE_OTHER): Payer: Medicare Other | Admitting: Surgery

## 2012-03-18 ENCOUNTER — Encounter (INDEPENDENT_AMBULATORY_CARE_PROVIDER_SITE_OTHER): Payer: Self-pay | Admitting: Surgery

## 2012-03-18 VITALS — BP 138/98 | HR 92 | Temp 98.2°F | Resp 18 | Ht 61.0 in | Wt 197.2 lb

## 2012-03-18 DIAGNOSIS — L03119 Cellulitis of unspecified part of limb: Secondary | ICD-10-CM | POA: Diagnosis not present

## 2012-03-18 DIAGNOSIS — L02419 Cutaneous abscess of limb, unspecified: Secondary | ICD-10-CM | POA: Diagnosis not present

## 2012-03-18 NOTE — Progress Notes (Signed)
Subjective:     Patient ID: Katherine [name], female   DOB: 1956-01-06, 56 y.o.   MRN: [id number]  HPI  She is here for evaluation of an abscess on the back of her right leg. She had antibiotics called in for her on Tuesday and is on doxycycline. She has a history of multiple MRSA abscesses Review of Systems     Objective:   Physical Exam On exam, there is moderate erythema just below the buttocks on the right posterior thigh.  There is a small area of induration. I prepped this with Betadine, anesthetized with lidocaine, and a small incision. There was a tiny abscess cavity which was too small to pack.    Assessment:     Right posterior thigh abscess    Plan:     She will continue antibiotics and local wound care. She will call and see Dr. Excell Seltzer if this does not resolve

## 2012-04-20 ENCOUNTER — Telehealth: Payer: Self-pay | Admitting: Emergency Medicine

## 2012-04-20 MED ORDER — MOXIFLOXACIN HCL 400 MG PO TABS: 400.0000 mg | ORAL_TABLET | Freq: Every day | ORAL | Status: DC

## 2012-04-20 NOTE — Telephone Encounter (Signed)
It would probably be OK, but I would prefer to change her. Order avelox 400mg  qd x 5 days instead. Thanks

## 2012-04-20 NOTE — Telephone Encounter (Signed)
Pt informed that abx was changed to Avelox and was sent to [facility] [address].

## 2012-04-20 NOTE — Telephone Encounter (Signed)
Pt c/o prod cough (green) for 2 days.  Pt has some sinus drainage (clear).  Occas SOB on exertion.  Denies fever.  Pt taking Tylenol cough and cold and using Pulmicort HHN bid. Pt also on Prednisone 5 mg/day.   Pt unable to come in for ov due to issue with foster child.  Please advise. Last ov: 02-05-12  Next ov: 04-28-12 Allergies  Allergen Reactions  . Codeine   . Erythromycin   . Hydromorphone Hcl     REACTION: nausea  . Nalbuphine   . Sulfonamide Derivatives   . Tetracycline

## 2012-04-20 NOTE — Telephone Encounter (Signed)
Called, spoke with pt.  I informed her of below recs per RB.  She verbalized understanding of this and voiced no further questions/concerns at this time.  [facility] [address]  Dr. Lamonte Sakai, when trying to seen z pak received an override msg.  Stating this is a contraindication as pt is allergic to erythromycin.  Pls advise if ok to proceed with sending this.  I do not see where pt has been given z pak in the past per Epic med list.  Thank you.  Allergies  Allergen Reactions  . Codeine   . Erythromycin   . Hydromorphone Hcl     REACTION: nausea  . Nalbuphine   . Sulfonamide Derivatives   . Tetracycline

## 2012-04-20 NOTE — Telephone Encounter (Signed)
Please order azithro (Z-pack) for her. Have her call us if no response. Thanks

## 2012-04-22 ENCOUNTER — Telehealth: Payer: Self-pay | Admitting: Emergency Medicine

## 2012-04-22 ENCOUNTER — Encounter (INDEPENDENT_AMBULATORY_CARE_PROVIDER_SITE_OTHER): Payer: Medicare Other | Admitting: General Surgery

## 2012-04-22 MED ORDER — LEVOFLOXACIN 500 MG PO TABS: 500.0000 mg | ORAL_TABLET | Freq: Every day | ORAL | Status: AC

## 2012-04-22 NOTE — Telephone Encounter (Signed)
Called and spoke with pt and she is aware of rx for the generic levaquin that has been sent to the pharmacy.  Pt voiced her understanding of this and nothing further is needed.

## 2012-04-22 NOTE — Telephone Encounter (Signed)
levaquin 500mg  qd x 5 days. thanks

## 2012-04-22 NOTE — Telephone Encounter (Signed)
Called and spoke with the pharmacy and the pts  insurance will not cover the avelox  but will cover--generic cipro and generic levaquin.   RB please advise of what you would like sent in for the pt.  Thanks  Allergies  Allergen Reactions  . Codeine   . Erythromycin   . Hydromorphone Hcl     REACTION: nausea  . Nalbuphine   . Sulfonamide Derivatives   . Tetracycline

## 2012-04-28 ENCOUNTER — Ambulatory Visit: Payer: Medicare Other | Admitting: Emergency Medicine

## 2012-05-02 ENCOUNTER — Telehealth: Payer: Self-pay | Admitting: Emergency Medicine

## 2012-05-02 MED ORDER — LEVOFLOXACIN 500 MG PO TABS: 500.0000 mg | ORAL_TABLET | Freq: Every day | ORAL | Status: DC

## 2012-05-02 NOTE — Telephone Encounter (Signed)
Pt aware of RX for Levaquin x 5 days. She will call if her sxs do not improve.

## 2012-05-02 NOTE — Telephone Encounter (Signed)
I spoke with pt and she c/o sore throat, cough w/ dark green phlem, wheezing, chest tx, white "stuff" in the back of her throat. She is requesting a refill on her Levaquin. She was not able to come in for an appt this week.s he is scheduled to come in and see TP on 9/ 25/13. She stated the reason she can't come in is bc her son had to have his tonsils removed. Please advise RB, thanks

## 2012-05-02 NOTE — Telephone Encounter (Signed)
Call in levaquin 500mg  daily x 5 days

## 2012-05-06 DIAGNOSIS — M19049 Primary osteoarthritis, unspecified hand: Secondary | ICD-10-CM | POA: Diagnosis not present

## 2012-05-06 DIAGNOSIS — IMO0001 Reserved for inherently not codable concepts without codable children: Secondary | ICD-10-CM | POA: Diagnosis not present

## 2012-05-06 DIAGNOSIS — M25569 Pain in unspecified knee: Secondary | ICD-10-CM | POA: Diagnosis not present

## 2012-05-06 DIAGNOSIS — M25519 Pain in unspecified shoulder: Secondary | ICD-10-CM | POA: Diagnosis not present

## 2012-05-11 ENCOUNTER — Encounter: Payer: Self-pay | Admitting: Adult Health

## 2012-05-11 ENCOUNTER — Ambulatory Visit (INDEPENDENT_AMBULATORY_CARE_PROVIDER_SITE_OTHER): Payer: Medicare Other | Admitting: Adult Health

## 2012-05-11 VITALS — BP 130/78 | HR 77 | Temp 97.1°F | Ht 61.0 in | Wt 194.8 lb

## 2012-05-11 DIAGNOSIS — J209 Acute bronchitis, unspecified: Secondary | ICD-10-CM

## 2012-05-11 DIAGNOSIS — Z23 Encounter for immunization: Secondary | ICD-10-CM

## 2012-05-11 DIAGNOSIS — N390 Urinary tract infection, site not specified: Secondary | ICD-10-CM | POA: Diagnosis not present

## 2012-05-11 NOTE — Progress Notes (Signed)
56 year old female patient has a known history of obesity, status post lap band surgery with significant weight loss. Patient also has a history of renal transplant on chronic immunosuppression with Imuran and prednisone. The patient has a history of sleep apnea now much improved after significant weight loss.  Still needs repeat PSG to prove that OSA has resolved. She has stopped using CPAP.   May 15, 2009 --Present for an acute office visit. Complains of coughing and chest tightness/congestion x 4wks. The cough has started a month ago and had one episode of bloody-tinged phlegm. Didn't take any OTC to help the cough. Has had some episode of vomitted after eating , this is chronic since lap band. Denies chest pain, abd. pain, melena. orthopnea, hemoptysis, fever, n/v/d, edema, headache   ROV 12/18/09 -- ROV to follow OSA/OHS, hx lap band sgy and GERD/reflux. Tells me that she was exposed to cleaning solution, bleach in the last week. Having some chest tightness, dry cough. Doing sinus rinses + nasonex. Taking omeprazole.   ROV 01/23/10 -- Acute visit. Has had fever, aches, head congestion, tight chest. Has had some dry cough, some nausea. Having trouble getting a deep breath.   July 30, 2010 --Presents for an acute office viist. Complains of sinus pressure/congestion, PND and sore throat, right ear discomfort, prod cough with yellow mucus, increased SOB, wheezing x 2 weeks . Has been doing well until last 2 weeks. Does not want to be sick for the holidays, family is coming in to visit.Denies chest pain, orthopnea, hemoptysis, fever, n/v/d, edema. Has alot of sinus congestion and pressure.   ROV 04/29/11 -- OSA/OHS, hx lap band sgy and GERD/reflux, often assoc w cough. She was hospitalized in Jan - Feb '12 for aspiration of gastric contents. She had to have the lap-band loosened because it was contributing to the aspiration. She gained 40 lbs and hasn't lost it. She presents today c/o sinus  congestion, HA beginning 1 month ago, some drainage to back of throat. Has been on her flonase and singulair. Doesn't use pulmicort anymore. No purulent mucous, no fever.  >>saline nasal rinses   05/06/11  Acute OV  Patient is no better since last visit with Dr. Lamonte Sakai. Patient c/o right ear pain, sore throat is worse since last visit.  Also feels fatigued. Throat is very sore . Singulair , flonase and saline rinses are not helping.  OTC is not helping.  >>Omnicef rx   Acute OV 12/23/11 --  Patient c/o ear pain in both ears, sore throat, chest tightness, chest pain mostly in her back, cough with thick stringy light yellow mucus, and wheezing that is mostly at night x 2 weeks. Just finished Doxy for boil.  CXR today shows  No acute process.  No fever or hemoptysis   ROV 02/05/12 -- OSA/OHS, hx lap band sgy and GERD/reflux, often assoc w cough. Treated for persistent cough and drainage as above. She is still experiencing some drainage, tolerating sinus washes and flonase. Still has frequent reflux/emesis - almost certainly causing UA irritation. [facility] follows her lap-band.  Took diflucan for thrush 2 weeks ago.  She is having pain on inspiration, especially when she lays down supine.   05/11/2012 Acute OV  Complains of 1 week follow up URI - finished levaquin 500mg .  reports prod cough, wheezing and tightness are resolved.   Feeling much better  Cough and wheezing have resolved.  No hemoptysis or chest pain.  No edema.    ROS;  .Constitutional:  No  weight loss, night sweats,  Fevers, chills,  +fatigue, or  lassitude.  HEENT:   No headaches,  Difficulty swallowing,  Tooth/dental problems, or  Sore throat,                No sneezing, itching, ear ache, nasal congestion, post nasal drip,   CV:  No chest pain,  Orthopnea, PND, swelling in lower extremities, anasarca, dizziness, palpitations, syncope.   GI  No heartburn, indigestion, abdominal pain,  , diarrhea, change in bowel habits, loss  of appetite, bloody stools.   Resp: .  No change in color of mucus.  .  No chest wall deformity  Skin: no rash or lesions.  GU: no dysuria, change in color of urine, no urgency or frequency.  No flank pain, no hematuria   MS:  No joint pain or swelling.  No decreased range of motion.    Psych:  No change in mood or affect. No depression or anxiety.  No memory loss.     PE :   Gen: Pleasant, obese, has gained wt, in no distress,  normal affect  ENT post pharynx erythema, no purulence  Neck: No JVD, no TMG, no carotid bruits  Lungs: No use of accessory muscles, no dullness to percussion, coarse BS  without rales or rhonchi  Cardiovascular: RRR, heart sounds normal, no murmur or gallops, no peripheral edema  Musculoskeletal: No deformities, no cyanosis or clubbing  Neuro: alert, non focal  Skin: Warm, no lesions or rashes

## 2012-05-11 NOTE — Patient Instructions (Addendum)
Continue on current regimen.  Flu shot today  Follow up as planned with Dr. Lamonte Sakai  And As needed

## 2012-05-11 NOTE — Progress Notes (Deleted)
  Subjective:    Patient ID: Katherine [name], female    DOB: 02-22-1956, 56 y.o.   MRN: [id number]  HPI    Review of Systems     Objective:   Physical Exam        Assessment & Plan:

## 2012-05-13 NOTE — Assessment & Plan Note (Signed)
Recent Flare  -now resolved   Plan Continue on current regimen.  Flu shot today  Follow up as planned with Dr. Lamonte Sakai  And As needed

## 2012-05-16 DIAGNOSIS — E119 Type 2 diabetes mellitus without complications: Secondary | ICD-10-CM | POA: Diagnosis not present

## 2012-05-16 DIAGNOSIS — I10 Essential (primary) hypertension: Secondary | ICD-10-CM | POA: Diagnosis not present

## 2012-05-16 DIAGNOSIS — J449 Chronic obstructive pulmonary disease, unspecified: Secondary | ICD-10-CM | POA: Diagnosis not present

## 2012-05-16 DIAGNOSIS — E785 Hyperlipidemia, unspecified: Secondary | ICD-10-CM | POA: Diagnosis not present

## 2012-05-16 DIAGNOSIS — E669 Obesity, unspecified: Secondary | ICD-10-CM | POA: Diagnosis not present

## 2012-05-16 DIAGNOSIS — I2510 Atherosclerotic heart disease of native coronary artery without angina pectoris: Secondary | ICD-10-CM | POA: Diagnosis not present

## 2012-05-16 DIAGNOSIS — Z940 Kidney transplant status: Secondary | ICD-10-CM | POA: Diagnosis not present

## 2012-05-26 DIAGNOSIS — I2510 Atherosclerotic heart disease of native coronary artery without angina pectoris: Secondary | ICD-10-CM | POA: Diagnosis not present

## 2012-05-26 DIAGNOSIS — N289 Disorder of kidney and ureter, unspecified: Secondary | ICD-10-CM | POA: Diagnosis not present

## 2012-05-26 DIAGNOSIS — E782 Mixed hyperlipidemia: Secondary | ICD-10-CM | POA: Diagnosis not present

## 2012-05-26 DIAGNOSIS — I10 Essential (primary) hypertension: Secondary | ICD-10-CM | POA: Diagnosis not present

## 2012-05-31 DIAGNOSIS — I10 Essential (primary) hypertension: Secondary | ICD-10-CM | POA: Diagnosis not present

## 2012-05-31 DIAGNOSIS — Z940 Kidney transplant status: Secondary | ICD-10-CM | POA: Diagnosis not present

## 2012-05-31 DIAGNOSIS — R7989 Other specified abnormal findings of blood chemistry: Secondary | ICD-10-CM | POA: Diagnosis not present

## 2012-06-10 ENCOUNTER — Encounter (INDEPENDENT_AMBULATORY_CARE_PROVIDER_SITE_OTHER): Payer: Self-pay | Admitting: General Surgery

## 2012-06-10 ENCOUNTER — Ambulatory Visit (INDEPENDENT_AMBULATORY_CARE_PROVIDER_SITE_OTHER): Payer: Medicare Other | Admitting: General Surgery

## 2012-06-10 ENCOUNTER — Encounter (INDEPENDENT_AMBULATORY_CARE_PROVIDER_SITE_OTHER): Payer: Self-pay

## 2012-06-10 NOTE — Progress Notes (Signed)
Chief complaint: Followup lap band  History: Patient returns for long-term follow up of lab and placed in December of 2005. I last saw her 8 months ago. Since that time her weight is down 5 pounds. She does not have much appetite. She tends to eat just serial. She is not having any vomiting or any recent pulmonary issues that she had had when her band was too tight. She currently has 2 cc in her band and has had difficulty when she is been any higher than this. She lost a brother recently has been somewhat depressed about this. She is however adopting 55-year-old disabled child and is excited about him. No other significant recent change in her health.  Exam: BP 138/80  Pulse 90  Temp 98 F (36.7 C) (Oral)  Resp 18  Ht 5\' 1"  (1.549 m)  Wt 195 lb 6.4 oz (88.633 kg)  BMI 36.92 kg/m2 Total weight loss 83 pounds since surgery, 5 pounds since last visit General: Appears well no distress Abdomen: Soft and nontender. Port site looks fine.  Assessment and plan: Status post lap band. Her weight has been lower in the past but she had a lot of regurgitation and pulmonary issues. Her weight loss is very good and she continues to lose a small amount of weight. I do not think she should have a fill and she agrees. We discussed ways to work for protein and her diet as well as exercise. She now has her brothers exercise bike and we'll begin using this. I asked her to return in 6 months.

## 2012-06-12 ENCOUNTER — Observation Stay (HOSPITAL_COMMUNITY): Payer: Medicare Other

## 2012-06-12 ENCOUNTER — Inpatient Hospital Stay (HOSPITAL_COMMUNITY)
Admission: EM | Admit: 2012-06-12 | Discharge: 2012-06-17 | DRG: 193 | Disposition: A | Discharge status: Home / Self Care | Payer: Medicare Other | Attending: Family Medicine | Admitting: Family Medicine

## 2012-06-12 ENCOUNTER — Encounter (HOSPITAL_COMMUNITY): Payer: Self-pay | Admitting: Emergency Medicine

## 2012-06-12 ENCOUNTER — Emergency Department (HOSPITAL_COMMUNITY): Payer: Medicare Other

## 2012-06-12 DIAGNOSIS — IMO0002 Reserved for concepts with insufficient information to code with codable children: Secondary | ICD-10-CM | POA: Diagnosis present

## 2012-06-12 DIAGNOSIS — T8610 Unspecified complication of kidney transplant: Secondary | ICD-10-CM

## 2012-06-12 DIAGNOSIS — J209 Acute bronchitis, unspecified: Secondary | ICD-10-CM | POA: Diagnosis not present

## 2012-06-12 DIAGNOSIS — L0291 Cutaneous abscess, unspecified: Secondary | ICD-10-CM | POA: Diagnosis not present

## 2012-06-12 DIAGNOSIS — G4733 Obstructive sleep apnea (adult) (pediatric): Secondary | ICD-10-CM | POA: Diagnosis present

## 2012-06-12 DIAGNOSIS — R112 Nausea with vomiting, unspecified: Secondary | ICD-10-CM | POA: Diagnosis not present

## 2012-06-12 DIAGNOSIS — A419 Sepsis, unspecified organism: Secondary | ICD-10-CM | POA: Diagnosis not present

## 2012-06-12 DIAGNOSIS — L0390 Cellulitis, unspecified: Secondary | ICD-10-CM

## 2012-06-12 DIAGNOSIS — I252 Old myocardial infarction: Secondary | ICD-10-CM | POA: Diagnosis not present

## 2012-06-12 DIAGNOSIS — Z940 Kidney transplant status: Secondary | ICD-10-CM

## 2012-06-12 DIAGNOSIS — R05 Cough: Secondary | ICD-10-CM

## 2012-06-12 DIAGNOSIS — E876 Hypokalemia: Secondary | ICD-10-CM

## 2012-06-12 DIAGNOSIS — G4730 Sleep apnea, unspecified: Secondary | ICD-10-CM | POA: Diagnosis present

## 2012-06-12 DIAGNOSIS — R42 Dizziness and giddiness: Secondary | ICD-10-CM | POA: Diagnosis not present

## 2012-06-12 DIAGNOSIS — R1110 Vomiting, unspecified: Secondary | ICD-10-CM | POA: Diagnosis not present

## 2012-06-12 DIAGNOSIS — R000 Tachycardia, unspecified: Secondary | ICD-10-CM

## 2012-06-12 DIAGNOSIS — Z9884 Bariatric surgery status: Secondary | ICD-10-CM

## 2012-06-12 DIAGNOSIS — K2970 Gastritis, unspecified, without bleeding: Secondary | ICD-10-CM | POA: Diagnosis not present

## 2012-06-12 DIAGNOSIS — R109 Unspecified abdominal pain: Secondary | ICD-10-CM | POA: Diagnosis not present

## 2012-06-12 DIAGNOSIS — D72829 Elevated white blood cell count, unspecified: Secondary | ICD-10-CM

## 2012-06-12 DIAGNOSIS — J811 Chronic pulmonary edema: Secondary | ICD-10-CM | POA: Diagnosis not present

## 2012-06-12 DIAGNOSIS — R51 Headache: Secondary | ICD-10-CM | POA: Diagnosis not present

## 2012-06-12 DIAGNOSIS — J189 Pneumonia, unspecified organism: Secondary | ICD-10-CM | POA: Diagnosis present

## 2012-06-12 DIAGNOSIS — R509 Fever, unspecified: Secondary | ICD-10-CM

## 2012-06-12 LAB — CBC WITH DIFFERENTIAL/PLATELET
Eosinophils Absolute: 0 10*3/uL (ref 0.0–0.7)
HCT: 37.6 % (ref 36.0–46.0)
Hemoglobin: 12.1 g/dL (ref 12.0–15.0)
Lymphs Abs: 0.7 10*3/uL (ref 0.7–4.0)
MCH: 27.6 pg (ref 26.0–34.0)
Monocytes Absolute: 0.8 10*3/uL (ref 0.1–1.0)
Monocytes Relative: 7 % (ref 3–12)
Neutro Abs: 10 10*3/uL — ABNORMAL HIGH (ref 1.7–7.7)
Neutrophils Relative %: 87 % — ABNORMAL HIGH (ref 43–77)
RBC: 4.39 MIL/uL (ref 3.87–5.11)

## 2012-06-12 LAB — COMPREHENSIVE METABOLIC PANEL
Alkaline Phosphatase: 123 U/L — ABNORMAL HIGH (ref 39–117)
BUN: 12 mg/dL (ref 6–23)
Chloride: 99 mEq/L (ref 96–112)
Creatinine, Ser: 0.9 mg/dL (ref 0.50–1.10)
GFR calc Af Amer: 83 mL/min — ABNORMAL LOW (ref >90)
Glucose, Bld: 146 mg/dL — ABNORMAL HIGH (ref 70–99)
Potassium: 2.6 mEq/L — CL (ref 3.5–5.1)
Total Bilirubin: 0.7 mg/dL (ref 0.3–1.2)

## 2012-06-12 LAB — URINALYSIS, ROUTINE W REFLEX MICROSCOPIC
Leukocytes, UA: NEGATIVE
Nitrite: NEGATIVE
Protein, ur: NEGATIVE mg/dL
Urobilinogen, UA: 0.2 mg/dL (ref 0.0–1.0)

## 2012-06-12 LAB — LACTIC ACID, PLASMA: Lactic Acid, Venous: 1.9 mmol/L (ref 0.5–2.2)

## 2012-06-12 MED ORDER — POTASSIUM CHLORIDE 10 MEQ/100ML IV SOLN
10.0000 meq | Freq: Once | INTRAVENOUS | Status: AC
  Administered 2012-06-12: 10 meq via INTRAVENOUS
  Filled 2012-06-12: qty 100

## 2012-06-12 MED ORDER — SODIUM CHLORIDE 0.9 % IV SOLN
Freq: Once | INTRAVENOUS | Status: AC
  Administered 2012-06-12: 15:00:00 via INTRAVENOUS

## 2012-06-12 MED ORDER — POTASSIUM CHLORIDE CRYS ER 20 MEQ PO TBCR
40.0000 meq | EXTENDED_RELEASE_TABLET | ORAL | Status: AC
  Administered 2012-06-13 (×3): 40 meq via ORAL
  Filled 2012-06-12 (×3): qty 2

## 2012-06-12 MED ORDER — LORAZEPAM 1 MG PO TABS
1.0000 mg | ORAL_TABLET | Freq: Three times a day (TID) | ORAL | Status: DC
  Administered 2012-06-13 – 2012-06-17 (×14): 1 mg via ORAL
  Filled 2012-06-12 (×15): qty 1

## 2012-06-12 MED ORDER — PREDNISONE 5 MG PO TABS
5.0000 mg | ORAL_TABLET | Freq: Every day | ORAL | Status: DC
  Administered 2012-06-13 – 2012-06-17 (×5): 5 mg via ORAL
  Filled 2012-06-12 (×7): qty 1

## 2012-06-12 MED ORDER — POTASSIUM CHLORIDE CRYS ER 20 MEQ PO TBCR
40.0000 meq | EXTENDED_RELEASE_TABLET | Freq: Once | ORAL | Status: AC
  Administered 2012-06-12: 40 meq via ORAL
  Filled 2012-06-12: qty 2

## 2012-06-12 MED ORDER — ACETAMINOPHEN 325 MG PO TABS
650.0000 mg | ORAL_TABLET | Freq: Four times a day (QID) | ORAL | Status: DC | PRN
  Administered 2012-06-13: 325 mg via ORAL
  Administered 2012-06-14: 650 mg via ORAL
  Filled 2012-06-12: qty 2
  Filled 2012-06-12: qty 1
  Filled 2012-06-12: qty 2

## 2012-06-12 MED ORDER — SODIUM CHLORIDE 0.9 % IV BOLUS (SEPSIS)
1000.0000 mL | Freq: Once | INTRAVENOUS | Status: AC
  Administered 2012-06-12 (×2): 1000 mL via INTRAVENOUS

## 2012-06-12 MED ORDER — DEXTROSE 5 % IV SOLN
2.0000 g | INTRAVENOUS | Status: DC
  Administered 2012-06-12 – 2012-06-13 (×2): 2 g via INTRAVENOUS
  Filled 2012-06-12 (×4): qty 2

## 2012-06-12 MED ORDER — MONTELUKAST SODIUM 10 MG PO TABS
10.0000 mg | ORAL_TABLET | Freq: Every day | ORAL | Status: DC
  Administered 2012-06-13 – 2012-06-16 (×5): 10 mg via ORAL
  Filled 2012-06-12 (×6): qty 1

## 2012-06-12 MED ORDER — OXYCODONE HCL 5 MG PO TABS
5.0000 mg | ORAL_TABLET | ORAL | Status: DC | PRN
  Administered 2012-06-13 – 2012-06-17 (×12): 5 mg via ORAL
  Filled 2012-06-12 (×12): qty 1

## 2012-06-12 MED ORDER — PANTOPRAZOLE SODIUM 40 MG PO TBEC
40.0000 mg | DELAYED_RELEASE_TABLET | Freq: Every day | ORAL | Status: DC
  Administered 2012-06-13 – 2012-06-17 (×5): 40 mg via ORAL
  Filled 2012-06-12 (×5): qty 1

## 2012-06-12 MED ORDER — ASPIRIN 81 MG PO CHEW
81.0000 mg | CHEWABLE_TABLET | Freq: Every day | ORAL | Status: DC
  Administered 2012-06-13 – 2012-06-17 (×5): 81 mg via ORAL
  Filled 2012-06-12 (×5): qty 1

## 2012-06-12 MED ORDER — SODIUM CHLORIDE 0.9 % IV BOLUS (SEPSIS): 1000.0000 mL | Freq: Once | INTRAVENOUS | Status: DC

## 2012-06-12 MED ORDER — SENNOSIDES-DOCUSATE SODIUM 8.6-50 MG PO TABS
1.0000 | ORAL_TABLET | Freq: Every evening | ORAL | Status: DC | PRN
  Filled 2012-06-12: qty 1

## 2012-06-12 MED ORDER — AZATHIOPRINE 50 MG PO TABS
50.0000 mg | ORAL_TABLET | Freq: Every day | ORAL | Status: DC
  Administered 2012-06-13 – 2012-06-17 (×5): 50 mg via ORAL
  Filled 2012-06-12 (×5): qty 1

## 2012-06-12 MED ORDER — DULOXETINE HCL 30 MG PO CPEP
30.0000 mg | ORAL_CAPSULE | Freq: Every day | ORAL | Status: DC
  Administered 2012-06-13 – 2012-06-17 (×4): 30 mg via ORAL
  Filled 2012-06-12 (×5): qty 1

## 2012-06-12 MED ORDER — ONDANSETRON HCL 4 MG/2ML IJ SOLN
4.0000 mg | Freq: Four times a day (QID) | INTRAMUSCULAR | Status: DC | PRN
  Administered 2012-06-12 – 2012-06-15 (×6): 4 mg via INTRAVENOUS
  Filled 2012-06-12 (×6): qty 2

## 2012-06-12 MED ORDER — ACETAMINOPHEN 650 MG RE SUPP: 650.0000 mg | Freq: Four times a day (QID) | RECTAL | Status: DC | PRN

## 2012-06-12 MED ORDER — ONDANSETRON HCL 4 MG PO TABS: 4.0000 mg | ORAL_TABLET | Freq: Four times a day (QID) | ORAL | Status: DC | PRN

## 2012-06-12 MED ORDER — VANCOMYCIN HCL IN DEXTROSE 1-5 GM/200ML-% IV SOLN
1000.0000 mg | Freq: Two times a day (BID) | INTRAVENOUS | Status: DC
  Administered 2012-06-13 – 2012-06-16 (×7): 1000 mg via INTRAVENOUS
  Filled 2012-06-12 (×10): qty 200

## 2012-06-12 MED ORDER — ENOXAPARIN SODIUM 40 MG/0.4ML ~~LOC~~ SOLN
40.0000 mg | Freq: Every day | SUBCUTANEOUS | Status: DC
  Administered 2012-06-13 – 2012-06-16 (×5): 40 mg via SUBCUTANEOUS
  Filled 2012-06-12 (×6): qty 0.4

## 2012-06-12 MED ORDER — CLOPIDOGREL BISULFATE 75 MG PO TABS
75.0000 mg | ORAL_TABLET | Freq: Every day | ORAL | Status: DC
  Administered 2012-06-13 – 2012-06-17 (×5): 75 mg via ORAL
  Filled 2012-06-12 (×7): qty 1

## 2012-06-12 MED ORDER — SODIUM CHLORIDE 0.9 % IV SOLN: INTRAVENOUS | Status: AC

## 2012-06-12 MED ORDER — OLOPATADINE HCL 0.1 % OP SOLN
1.0000 [drp] | Freq: Every day | OPHTHALMIC | Status: DC
  Administered 2012-06-13 – 2012-06-17 (×5): 1 [drp] via OPHTHALMIC
  Filled 2012-06-12: qty 5

## 2012-06-12 MED ORDER — POTASSIUM CHLORIDE IN NACL 20-0.9 MEQ/L-% IV SOLN
INTRAVENOUS | Status: DC
  Administered 2012-06-12 – 2012-06-14 (×3): via INTRAVENOUS
  Filled 2012-06-12 (×6): qty 1000

## 2012-06-12 MED ORDER — ACETAMINOPHEN 500 MG PO TABS
1000.0000 mg | ORAL_TABLET | Freq: Once | ORAL | Status: AC
  Administered 2012-06-12: 1000 mg via ORAL
  Filled 2012-06-12: qty 2

## 2012-06-12 MED ORDER — ACETAMINOPHEN 325 MG PO TABS
650.0000 mg | ORAL_TABLET | Freq: Four times a day (QID) | ORAL | Status: DC | PRN
  Filled 2012-06-12: qty 2

## 2012-06-12 MED ORDER — SODIUM CHLORIDE 0.9 % IV SOLN
3.0000 g | Freq: Four times a day (QID) | INTRAVENOUS | Status: DC
  Administered 2012-06-13 – 2012-06-16 (×12): 3 g via INTRAVENOUS
  Filled 2012-06-12 (×19): qty 3

## 2012-06-12 NOTE — Progress Notes (Signed)
Called ED for report. Placed on hold for 6 minutes. Still waiting for RN to call back.

## 2012-06-12 NOTE — H&P (Signed)
[facility]          History and Physical    PCP:   DETERDING,JAMES L, MD   Chief Complaint:  Fever, body aches, nausea, cough  HPI: 56 y/o woman with PMH significant for gastric bypass, kidney transplant on immune suppressive therapy, who presents with the above complaints for 3 days. Patient's foster son (who is 4) has been having a cough for 5 days. 3 days ago she started coughing as well. Cough is productive of greenish-sputum. Shortly thereafter she developed myalgias, fever up to 101 at home. She has also had nausea and vomiting. No blood in emesis. When examined, she is found to have a scab on her left upper arm that is surrounded by redness. She states she had a "spider bite". That has been there for about 7 days. In the ED she is hypokalemic to 2.6, febrile to 101 and we were asked to admit her for further evaluation and management.  Allergies:   Allergies  Allergen Reactions  . Codeine   . Erythromycin   . Hydromorphone Hcl     REACTION: nausea  . Nalbuphine   . Sulfonamide Derivatives   . Tetracycline       Past Medical History  Diagnosis Date  . Chronic kidney disease   . Heart attack   . Hypertension   . Allergy   . Fibromyalgia   . Boils     lft and rt thigh, lft groin  . MRSA (methicillin resistant Staphylococcus aureus)   . Anxiety   . Depression   . Vomiting   . Poor appetite     Past Surgical History  Procedure Date  . Laparoscopic gastric banding 04/2004  . Catheter removal   . Coronary artery bypass graft   . Kidney transplant   . Wrist fistula repair   . Tubal ligation   . Cardiac surgery     stents  . Port-a-cath removal     kidney  . Incise and drain abcess     Prior to Admission medications   Medication Sig Start Date End Date Taking? Authorizing Provider  predniSONE (DELTASONE) 5 MG tablet Take 5 mg by mouth daily.     Yes Historical Provider, MD  aspirin 81 MG tablet Take 81 mg by mouth daily.      Historical  Provider, MD  azaTHIOprine (IMURAN) 50 MG tablet Take 50 mg by mouth daily.      Historical Provider, MD  budesonide (PULMICORT) 0.5 MG/2ML nebulizer solution Take 2 mLs (0.5 mg total) by nebulization 2 (two) times daily. As needed dx: 466.0 12/31/11 12/30/12  Collene Gobble, MD  buprenorphine (BUTRANS) 20 MCG/HR PTWK Place 20 mcg onto the skin once a week.    Historical Provider, MD  calcitRIOL (ROCALTROL) 0.25 MCG capsule daily. 03/04/12   Historical Provider, MD  Calcium Citrate-Vitamin D (CITRACAL + D PO) Take by mouth daily.    Historical Provider, MD  clopidogrel (PLAVIX) 75 MG tablet Take 75 mg by mouth daily.      Historical Provider, MD  clotrimazole (MYCELEX) 10 MG troche Take as directed 05/10/12   Historical Provider, MD  cyclobenzaprine (FLEXERIL) 10 MG tablet Take 10 mg by mouth 3 (three) times daily.     Historical Provider, MD  DULoxetine (CYMBALTA) 30 MG capsule Take 30 mg by mouth daily.      Historical Provider, MD  fluticasone (FLONASE) 50 MCG/ACT nasal spray INHALE 2 SPRAYS IN [facility] NOSTRIL DAILY 09/05/11 09/06/12  Rose Fillers  Byrum, MD  levalbuterol (XOPENEX) 0.63 MG/3ML nebulizer solution Take 3 mLs (0.63 mg total) by nebulization every 4 (four) hours as needed. Dx: 466.0 12/30/11 12/29/12  Tammy S Parrett, NP  LORazepam (ATIVAN) 1 MG tablet Take 1 mg by mouth every 8 (eight) hours.     Historical Provider, MD  montelukast (SINGULAIR) 10 MG tablet TAKE 1 TABLET ONCE A DAY 01/08/12   Collene Gobble, MD  omeprazole (PRILOSEC) 40 MG capsule Take 40 mg by mouth daily.      Historical Provider, MD  PATANOL 0.1 % ophthalmic solution Place 1 drop into both eyes daily.  05/12/11   Historical Provider, MD  promethazine (PHENERGAN) 25 MG tablet At bedtime. 02/02/12   Historical Provider, MD  Rosuvastatin Calcium (CRESTOR PO) Take by mouth daily. Patient unsure of dosage    Historical Provider, MD  Sertraline HCl (ZOLOFT PO) Take by mouth daily.      Historical Provider, MD  traMADol Veatrice Bourbon) 50 MG  tablet  05/20/11   Historical Provider, MD    Social History:  reports that she quit smoking about 9 years ago. Her smoking use included Cigarettes. She has a 30 pack-year smoking history. She has never used smokeless tobacco. She reports that she does not drink alcohol or use illicit drugs.  Family History  Problem Relation Age of Onset  . Cancer Mother     liver  . Heart disease Father   . Cancer Father     colon    Review of Systems:  Constitutional: Positive for fever, chills, diaphoresis, appetite change and fatigue.  HEENT: Denies photophobia, eye pain, redness, hearing loss, ear pain, rhinorrhea, sneezing, mouth sores, trouble swallowing, neck pain, neck stiffness and tinnitus.   Respiratory: Denies SOB, DOE,  chest tightness,  and wheezing.   Cardiovascular: Denies chest pain, palpitations and leg swelling.  Gastrointestinal: Denies  abdominal pain, diarrhea, constipation, blood in stool and abdominal distention.  Genitourinary: Denies dysuria, urgency, frequency, hematuria, flank pain and difficulty urinating.  Musculoskeletal: Denies back pain, joint swelling, arthralgias and gait problem.  Skin: Denies pallor, rash and wound.  Neurological: Denies dizziness, seizures, syncope, weakness, light-headedness, numbness and headaches.  Hematological: Denies adenopathy. Easy bruising, personal or family bleeding history  Psychiatric/Behavioral: Denies suicidal ideation, mood changes, confusion, nervousness, sleep disturbance and agitation   Physical Exam: Blood pressure 101/60, pulse 110, temperature 99.8 F (37.7 C), temperature source Oral, resp. rate 18, SpO2 94.00%. Gen: AA Ox3 HEENT: [place]/AT/PERRL/EOMI/dry mucous membranes. Neck: supple, no JVD, no LAD, no bruits, no goiter. CV: RRR, no M/R/G Lungs: CTA B Abd: S/NT/ND/+BS/no masses or organomegaly. Ext: no C/C/E, +pedal pulses. Neuro: grossly intact and non-focal   Labs on Admission:  Results for orders placed during  the hospital encounter of 06/12/12 (from the past 48 hour(s))  CBC WITH DIFFERENTIAL     Status: Abnormal   Collection Time   06/12/12  4:09 PM      Component Value Range Comment   WBC 11.5 (*) 4.0 - 10.5 K/uL    RBC 4.39  3.87 - 5.11 MIL/uL    Hemoglobin 12.1  12.0 - 15.0 g/dL    HCT 37.6  36.0 - 46.0 %    MCV 85.6  78.0 - 100.0 fL    MCH 27.6  26.0 - 34.0 pg    MCHC 32.2  30.0 - 36.0 g/dL    RDW 15.1  11.5 - 15.5 %    Platelets 184  150 - 400 K/uL  Neutrophils Relative 87 (*) 43 - 77 %    Neutro Abs 10.0 (*) 1.7 - 7.7 K/uL    Lymphocytes Relative 6 (*) 12 - 46 %    Lymphs Abs 0.7  0.7 - 4.0 K/uL    Monocytes Relative 7  3 - 12 %    Monocytes Absolute 0.8  0.1 - 1.0 K/uL    Eosinophils Relative 0  0 - 5 %    Eosinophils Absolute 0.0  0.0 - 0.7 K/uL    Basophils Relative 0  0 - 1 %    Basophils Absolute 0.0  0.0 - 0.1 K/uL   COMPREHENSIVE METABOLIC PANEL     Status: Abnormal   Collection Time   06/12/12  4:09 PM      Component Value Range Comment   Sodium 136  135 - 145 mEq/L    Potassium 2.6 (*) 3.5 - 5.1 mEq/L    Chloride 99  96 - 112 mEq/L    CO2 25  19 - 32 mEq/L    Glucose, Bld 146 (*) 70 - 99 mg/dL    BUN 12  6 - 23 mg/dL    Creatinine, Ser 0.90  0.50 - 1.10 mg/dL    Calcium 9.0  8.4 - 10.5 mg/dL    Total Protein 7.1  6.0 - 8.3 g/dL    Albumin 3.4 (*) 3.5 - 5.2 g/dL    AST 32  0 - 37 U/L    ALT 20  0 - 35 U/L    Alkaline Phosphatase 123 (*) 39 - 117 U/L    Total Bilirubin 0.7  0.3 - 1.2 mg/dL    GFR calc non Af Amer 71 (*) >90 mL/min    GFR calc Af Amer 83 (*) >90 mL/min   TROPONIN I     Status: Normal   Collection Time   06/12/12  4:09 PM      Component Value Range Comment   Troponin I <0.30  <0.30 ng/mL   LACTIC ACID, PLASMA     Status: Normal   Collection Time   06/12/12  4:09 PM      Component Value Range Comment   Lactic Acid, Venous 1.9  0.5 - 2.2 mmol/L     Radiological Exams on Admission: Dg Chest 2 View  06/12/2012  *RADIOLOGY REPORT*   Clinical Data: Nausea with vomiting and fever.  History of myocardial infarction and CABG.  CHEST - 2 VIEW  Comparison: 12/23/2011.  Findings: The heart size and mediastinal contours are stable status post CABG.  Several of the sternotomy wires are chronically fractured.  There are lower lung volumes with new patchy perihilar opacities bilaterally.  No edema or confluent airspace opacity is present.  There is no pleural effusion.  IMPRESSION: Low lung volumes with increased probable perihilar atelectasis.  No consolidation or definite edema.   Original Report Authenticated By: Vivia Ewing, M.D.    Dg Abd 2 Views  06/12/2012  *RADIOLOGY REPORT*  Clinical Data: Abdominal pain  ABDOMEN - 2 VIEW  Comparison: CT of the abdomen and pelvis with contrast 06/04/2008.  Findings: The lung bases are clear.  A gastric band is again noted within satisfactory position.  The bowel gas pattern is unremarkable.  There is no evidence for obstruction or free air. The axial skeleton is unremarkable.  IMPRESSION:  1.  Status post gastric banding.  The device is in a satisfactory position. 2.  No acute abnormality.   Original Report Authenticated By: Resa Miner.  MATTERN, M.D.     Assessment/Plan Principal Problem:  *Fever Active Problems:  OBSTRUCTIVE SLEEP APNEA  Hypokalemia  Bronchitis  Cellulitis  Renal transplant disorder    Fever -Possible etiologies include URI/bronchitis and cellulitis of the left upper arm. -Blood/sputum/urine cx ordered. -CXR negative. -Given her immunosuppressed state will start on broad-spectrum coverage with vanc and unasyn which should provide coverage for both sources.  Hypokalemia -From vomiting. -Replete PO. -Check Mag level.  OSA -CPAP qHs.  DVT Prophylaxis -Lovenox.   Time Spent on Admission: 70 minutes.  Lelon Frohlich [facility] Pager: [phone] 06/12/2012, 7:05 PM

## 2012-06-12 NOTE — Progress Notes (Signed)
Spoke with pt regarding nighttime CPAP order. Pt states used to wear one but not anymore. Pt is not interested in wearing one at this time. Pt currently on 2L oxygen, vss. RN aware

## 2012-06-12 NOTE — ED Provider Notes (Signed)
History     CSN: [id number]  Arrival date & time 06/12/12  1337   First MD Initiated Contact with Patient 06/12/12 1452      Chief Complaint  Patient presents with  . Nausea  . Emesis  . Headache  . Abdominal Pain    (Consider location/radiation/quality/duration/timing/severity/associated sxs/prior treatment) HPI Comments: 56 year old female with a history of chronic kidney disease (kidney transplant treated with Imuran and daily prednisone) MI, and fibromyalgia presents emergency department with multiple chief complaints.  Onset of symptoms began last night and consisted of nausea, vomiting, headache, fever, dizziness, abdominal pain, dysuria, urinary frequency and shortness of breath.  Patient's significant other was called this morning at 10 a.m. and patient sounded completely normal however as he was caring for her patient began to have slurred speech and less energy.  Patient states abdominal pain is diffuse and denies any localization.  Fever went as high as 102. Pt denies taking any recent medication, no alleviating factors known. Pt denies any melena, hematochezia, CP, leg swelling, syncope, weight loss, change in vision, or ataxia.   The history is provided by the patient.    Past Medical History  Diagnosis Date  . Chronic kidney disease   . Heart attack   . Hypertension   . Allergy   . Fibromyalgia   . Boils     lft and rt thigh, lft groin  . MRSA (methicillin resistant Staphylococcus aureus)   . Anxiety   . Depression   . Vomiting   . Poor appetite     Past Surgical History  Procedure Date  . Laparoscopic gastric banding 04/2004  . Catheter removal   . Coronary artery bypass graft   . Kidney transplant   . Wrist fistula repair   . Tubal ligation   . Cardiac surgery     stents  . Port-a-cath removal     kidney  . Incise and drain abcess     Family History  Problem Relation Age of Onset  . Cancer Mother     liver  . Heart disease Father   . Cancer  Father     colon    History  Substance Use Topics  . Smoking status: Former Smoker -- 1.0 packs/day for 30 years    Types: Cigarettes    Quit date: 08/17/2002  . Smokeless tobacco: Never Used  . Alcohol Use: No    OB History    Grav Para Term Preterm Abortions TAB SAB Ect Mult Living                  Review of Systems  Allergies  Codeine; Erythromycin; Hydromorphone hcl; Nalbuphine; Sulfonamide derivatives; and Tetracycline  Home Medications   Current Outpatient Rx  Name Route Sig Dispense Refill  . ASPIRIN 81 MG PO TABS Oral Take 81 mg by mouth daily.      . AZATHIOPRINE 50 MG PO TABS Oral Take 50 mg by mouth daily.      . BUDESONIDE 0.5 MG/2ML IN SUSP Nebulization Take 2 mLs (0.5 mg total) by nebulization 2 (two) times daily. As needed dx: 466.0 120 mL 5  . BUPRENORPHINE 20 MCG/HR TD PTWK Transdermal Place 20 mcg onto the skin once a week.    [name] Kitchen CALCITRIOL 0.25 MCG PO CAPS  daily.    [name] Kitchen CITRACAL + D PO Oral Take by mouth daily.    [name] Kitchen CLOPIDOGREL BISULFATE 75 MG PO TABS Oral Take 75 mg by mouth daily.      [name] Kitchen  CLOTRIMAZOLE 10 MG MT TROC  Take as directed    . CYCLOBENZAPRINE HCL 10 MG PO TABS Oral Take 10 mg by mouth 3 (three) times daily.     . DULOXETINE HCL 30 MG PO CPEP Oral Take 30 mg by mouth daily.      [name] Kitchen FLUTICASONE PROPIONATE 50 MCG/ACT NA SUSP  INHALE 2 SPRAYS IN EACH NOSTRIL DAILY 16 g 8  . LEVALBUTEROL HCL 0.63 MG/3ML IN NEBU Nebulization Take 3 mLs (0.63 mg total) by nebulization every 4 (four) hours as needed. Dx: 466.0 540 mL 3  . LORAZEPAM 1 MG PO TABS Oral Take 1 mg by mouth every 8 (eight) hours.     [name] Kitchen MONTELUKAST SODIUM 10 MG PO TABS  TAKE 1 TABLET ONCE A DAY 30 tablet 2  . OMEPRAZOLE 40 MG PO CPDR Oral Take 40 mg by mouth daily.      [name] Kitchen PATANOL 0.1 % OP SOLN Both Eyes Place 1 drop into both eyes daily.     [name] Kitchen PREDNISONE 5 MG PO TABS Oral Take 5 mg by mouth daily.      [name] Kitchen PROMETHAZINE HCL 25 MG PO TABS  At bedtime.    . CRESTOR PO Oral Take by mouth daily.  Patient unsure of dosage    . ZOLOFT PO Oral Take by mouth daily.      . TRAMADOL HCL 50 MG PO TABS        BP 101/60  Pulse 110  Temp 101.1 F (38.4 C) (Oral)  Resp 18  SpO2 94%  Physical Exam  Nursing note and vitals reviewed. Constitutional: She appears well-developed and well-nourished. She has a sickly appearance. No distress.  HENT:       Normocephalic and atraumatic  Eyes: Conjunctivae normal and EOM are normal.  Neck: Normal range of motion.  Cardiovascular:       Mild tachycardia, intact distal pulses.  No pitting edema.  Pulmonary/Chest: Effort normal.       Lungs clear to auscultation bilaterally  Abdominal:       Soft abdomen with diffuse tenderness and hyperactive bowel sounds.  No peritoneal signs.  Musculoskeletal: Normal range of motion.  Neurological:       CN III-XII intact. A&O x 3. Good strength and coordination.   Skin: Skin is warm and dry. No rash noted. She is not diaphoretic.  Psychiatric: She has a normal mood and affect. Her behavior is normal.    ED Course  Procedures (including critical care time)   Labs Reviewed  CBC WITH DIFFERENTIAL  COMPREHENSIVE METABOLIC PANEL  CULTURE, BLOOD (ROUTINE X 2)  CULTURE, BLOOD (ROUTINE X 2)  POCT LACTIC ACID (LACTATE)  URINALYSIS, ROUTINE W REFLEX MICROSCOPIC  URINE CULTURE   No results found.  Date: 06/12/2012  Rate: 81  Rhythm: normal sinus rhythm  QRS Axis: normal  Intervals: normal  ST/T Wave abnormalities: normal  Conduction Disutrbances: none  Narrative Interpretation:   Old EKG Reviewed: No significant changes noted   Date: 06/12/2012 5:09 PM   Rate: 105  Rhythm: sinus tachycardia  QRS Axis: indeterminate  Intervals: normal  ST/T Wave abnormalities: normal and indeterminate  Conduction Disutrbances:none  Narrative Interpretation:   Old EKG Reviewed: changes noted, insignificant     No diagnosis found.  BP 101/60  Pulse 110  Temp 101.1 F (38.4 C) (Oral)  Resp 18  SpO2 94%      MDM  Hypokalemia, leukocytosis, febrile and tachycardia 56 year old kidney transplant patient presents to the emergency department  complaining of nausea, vomiting,  diffuse abdominal pain and urinary symptoms of dysuria and urinary frequency.  Vitals as above.  Labs and imaging reviewed, acute abd series and UA pending.  Fluids and IV potassium given. Pt to be admitted ([facility] 4). The patient appears reasonably stabilized for admission considering the current resources, flow, and capabilities available in the ED at this time, and I doubt any other [facility] requiring further screening and/or treatment in the ED prior to admission.          Verl Dicker, PA-C 06/12/12 [address], PA-C 06/12/12 1726

## 2012-06-12 NOTE — ED Notes (Signed)
DR. Georgeanna Lea REDIRECTED TO PAGE  TOM Rogue Bussing

## 2012-06-12 NOTE — Progress Notes (Signed)
ANTIBIOTIC CONSULT NOTE - INITIAL  Pharmacy Consult for Vancomycin, Unasyn Indication: Bronchitis & Cellulitis of left upper arm  Allergies  Allergen Reactions  . Codeine   . Erythromycin   . Hydromorphone Hcl     REACTION: nausea  . Nalbuphine   . Sulfonamide Derivatives   . Tetracycline     Patient Measurements: Height: 5' 1.02" (155 cm) Weight: 195 lb 5.2 oz (88.6 kg) IBW/kg (Calculated) : 47.86    Vital Signs: Temp: 99.2 F (37.3 C) (10/27 2032) Temp src: Oral (10/27 2032) BP: 81/48 mmHg (10/27 2200) Pulse Rate: 90  (10/27 2200) Intake/Output from previous day:   Intake/Output from this shift:    Labs:  Basename 06/12/12 1609  WBC 11.5*  HGB 12.1  PLT 184  LABCREA --  CREATININE 0.90   Estimated Creatinine Clearance: 72.4 ml/min (by C-G formula based on Cr of 0.9). No results found for this basename: VANCOTROUGH:2,VANCOPEAK:2,VANCORANDOM:2,GENTTROUGH:2,GENTPEAK:2,GENTRANDOM:2,TOBRATROUGH:2,TOBRAPEAK:2,TOBRARND:2,AMIKACINPEAK:2,AMIKACINTROU:2,AMIKACIN:2, in the last 72 hours   Microbiology: No results found for this or any previous visit (from the past 720 hour(s)).  Medical History: Past Medical History  Diagnosis Date  . Chronic kidney disease   . Heart attack   . Hypertension   . Allergy   . Fibromyalgia   . Boils     lft and rt thigh, lft groin  . MRSA (methicillin resistant Staphylococcus aureus)   . Anxiety   . Depression   . Vomiting   . Poor appetite     Medications:  Scheduled:    . sodium chloride   Intravenous Once  . sodium chloride   Intravenous STAT  . acetaminophen  1,000 mg Oral Once  . aspirin  81 mg Oral Daily  . azaTHIOprine  50 mg Oral Daily  . cefTRIAXone (ROCEPHIN)  IV  2 g Intravenous Q24H  . clopidogrel  75 mg Oral Q breakfast  . DULoxetine  30 mg Oral Daily  . enoxaparin (LOVENOX) injection  40 mg Subcutaneous QHS  . LORazepam  1 mg Oral Q8H  . montelukast  10 mg Oral QHS  . olopatadine  1 drop Both Eyes Daily    . pantoprazole  40 mg Oral Daily  . potassium chloride  10 mEq Intravenous Once  . potassium chloride  40 mEq Oral Once  . potassium chloride  40 mEq Oral Q4H  . predniSONE  5 mg Oral Q breakfast  . sodium chloride  1,000 mL Intravenous Once  . sodium chloride  1,000 mL Intravenous Once   Infusions:    . 0.9 % NaCl with KCl 20 mEq / L 75 mL/hr at 06/12/12 2251   Assessment:  56 year old female with complaint of fever, aches, nausea, cough.  H/O gastric bypass, kidney transplant (on prednisone, azathioprine). Patient with recent spider bite on arm.  Rocephin 2gm IV q24h per MD initiated  IV Vancomycin and Unasyn to be added for broad spectrum coverage due to immunosuppression for suspected upper respiratory tract infection and cellulitis of left upper arm  Cultures ordered  Goal of Therapy:  Vancomycin trough level 15-20 mcg/ml  Plan:  Measure antibiotic drug levels at steady state Follow up culture results Vancomycin 1000mg  IV q12h Unasyn 3gm IV q6h  Maximino Cozzolino, Toribio Harbour, PharmD 06/12/2012,11:18 PM

## 2012-06-12 NOTE — Progress Notes (Signed)
[facility] progress note. Chief complaint. Hypotension. History of present illness. This 56 year old female presented to [facility] long emergency room with fever, body aches, nausea and vomiting and cough. Admitting impression was of fever possible etiologies including upper respiratory infection/bronchitis and cellulitis of the left upper arm. Patient is on immunosuppression status post kidney transplant. Nursing called me to the bedside and they do hypotension. Systolic blood pressure as low as the middle 50s. I ordered the patient to be bolused a total of 2 L of normal saline. I came to see the patient at the bedside to ascertain her clinical stability. She has no complaints of central chest pain or dyspnea. Does have some lateral chest pain with cough on the right. Her systolic blood pressures have improved to the 90s with infusion of the fluid bolus. He note that she had a lactic acid level earlier that was normal and 1.9. Patient has blood, sputum, urine cultures all of ordered and pending. Vital signs. Temperature 99.2, pulse 95, respiration 18, blood pressure 94/62. O2 sats 94%. General appearance. Well-developed middle-aged female in no distress. Alert, cooperative, and oriented. Cardiac. Rate and rhythm regular. No jugular venous distention or edema. Lungs. Breath sounds are clear and equal. Abdomen. Soft with positive bowel sounds. No pain. Impression/plan. Problem #1. Hypotension. This likely multifactorial with infection and probable dehydration. Patient has had multiple incidence of vomiting at home and has had little oral intake of fluids over the past 24 hours. Patient appears to be responding well to fluid bolus of 2 L. Systolic blood pressures improved to the mid 90s. I have asked the ER staff to arrange bed in step down for closer monitoring. I have added a pro-calcitonin level and we'll follow for this result.

## 2012-06-12 NOTE — ED Notes (Addendum)
PER EMS- pt picked up from home with c/o n/v, headache, fever, dizziness, abdominal pain, and SOB x1 this morning. Pt arrived to ED alert and oriented and NAD.  Denies diarrhea.

## 2012-06-12 NOTE — ED Provider Notes (Addendum)
Medical screening examination/treatment/procedure(s) were conducted as a shared visit with non-physician practitioner(s) and myself.  I personally evaluated the patient during the encounter   Pt with history of renal transplant presenting with fever.  Considering her immunocompromised state, pt will be admitted for further workup.  Hemodynamically stable at this time.  Kathalene Frames, MD 06/12/12 1758  9:16 PM the patient was getting ready to be transported upstairs. She. Been seen by the hospitalist and admission orders were ordered.  The patient's blood pressure has now decreased to the high 70s. IV fluid bolus has been ordered. There is a staff will notify the admitting provider. Patient will likely need to be admitted to the step down versus ICU for sepsis protocol. I reviewed the records and the patient had not received any antibiotics yet. I ordered Rocephin and instructed the nurses to please administer the antibiotics ordered by the admitting physician. The patient remains alert and awake in no distress  Kathalene Frames, MD 06/12/12 2118

## 2012-06-13 ENCOUNTER — Inpatient Hospital Stay (HOSPITAL_COMMUNITY): Payer: Medicare Other

## 2012-06-13 DIAGNOSIS — J189 Pneumonia, unspecified organism: Principal | ICD-10-CM

## 2012-06-13 DIAGNOSIS — T8610 Unspecified complication of kidney transplant: Secondary | ICD-10-CM

## 2012-06-13 DIAGNOSIS — D72829 Elevated white blood cell count, unspecified: Secondary | ICD-10-CM | POA: Insufficient documentation

## 2012-06-13 LAB — EXPECTORATED SPUTUM ASSESSMENT W GRAM STAIN, RFLX TO RESP C

## 2012-06-13 LAB — URINE CULTURE

## 2012-06-13 LAB — CBC
Hemoglobin: 11.4 g/dL — ABNORMAL LOW (ref 12.0–15.0)
MCH: 28.2 pg (ref 26.0–34.0)
MCHC: 33.5 g/dL (ref 30.0–36.0)
Platelets: 157 10*3/uL (ref 150–400)

## 2012-06-13 LAB — MAGNESIUM: Magnesium: 1.1 mg/dL — ABNORMAL LOW (ref 1.5–2.5)

## 2012-06-13 LAB — BASIC METABOLIC PANEL
BUN: 14 mg/dL (ref 6–23)
Calcium: 8 mg/dL — ABNORMAL LOW (ref 8.4–10.5)
GFR calc Af Amer: 88 mL/min — ABNORMAL LOW (ref >90)
GFR calc non Af Amer: 76 mL/min — ABNORMAL LOW (ref >90)
Glucose, Bld: 95 mg/dL (ref 70–99)
Potassium: 4.2 mEq/L (ref 3.5–5.1)
Sodium: 138 mEq/L (ref 135–145)

## 2012-06-13 LAB — PROCALCITONIN: Procalcitonin: 66.19 ng/mL

## 2012-06-13 MED ORDER — CHLORHEXIDINE GLUCONATE CLOTH 2 % EX PADS
6.0000 | MEDICATED_PAD | Freq: Every day | CUTANEOUS | Status: AC
  Administered 2012-06-13 – 2012-06-17 (×5): 6 via TOPICAL

## 2012-06-13 MED ORDER — SODIUM CHLORIDE 0.9 % IV BOLUS (SEPSIS)
1000.0000 mL | Freq: Once | INTRAVENOUS | Status: AC
  Administered 2012-06-13: 1000 mL via INTRAVENOUS

## 2012-06-13 MED ORDER — NYSTATIN 100000 UNIT/ML MT SUSP
5.0000 mL | Freq: Four times a day (QID) | OROMUCOSAL | Status: DC
  Administered 2012-06-13 – 2012-06-17 (×16): 500000 [IU] via ORAL
  Filled 2012-06-13 (×19): qty 5

## 2012-06-13 MED ORDER — MAGNESIUM SULFATE 50 % IJ SOLN: 2.0000 g | Freq: Once | INTRAMUSCULAR | Status: DC

## 2012-06-13 MED ORDER — MAGNESIUM SULFATE 40 MG/ML IJ SOLN
2.0000 g | Freq: Once | INTRAMUSCULAR | Status: AC
  Administered 2012-06-13: 2 g via INTRAVENOUS
  Filled 2012-06-13: qty 50

## 2012-06-13 MED ORDER — MUPIROCIN 2 % EX OINT
1.0000 "application " | TOPICAL_OINTMENT | Freq: Two times a day (BID) | CUTANEOUS | Status: AC
  Administered 2012-06-13 – 2012-06-17 (×10): 1 via NASAL
  Filled 2012-06-13 (×3): qty 22

## 2012-06-13 NOTE — Progress Notes (Signed)
Spoke with patient regarding cpap for rest.  Pt stated she does not wear cpap at home anymore, and is not interested in wearing one here.  Pt advised that RT available should she change her mind.

## 2012-06-13 NOTE — Progress Notes (Signed)
CARE MANAGEMENT NOTE 06/13/2012  Patient:  Katherine [name], Katherine [name]   Account Number:  [id number]  Date Initiated:  06/13/2012  Documentation initiated by:  Tyreshia Ingman  Subjective/Objective Assessment:   pt admitted due to hypotensive and recent hx of nausea, vomiting and temp up to 101     Action/Plan:   from home   Anticipated DC Date:  06/16/2012   Anticipated DC Plan:  HOME/SELF CARE  In-house referral  NA      DC Planning Services  NA      [facility] Choice  NA   Choice offered to / List presented to:  NA   DME arranged  NA      DME agency  NA     [place] arranged  NA      [place] agency  NA   Status of service:  In process, will continue to follow Medicare Important Message given?  YES (If response is "NO", the following Medicare IM given date fields will be blank) Date Medicare IM given:  06/12/2012 Date Additional Medicare IM given:    Discharge Disposition:    Per UR Regulation:  Reviewed for med. necessity/level of care/duration of stay  If discussed at [place] of Stay Meetings, dates discussed:    Comments:  [id number]/Darriona Dehaas,RN,BSn,CCM

## 2012-06-13 NOTE — Progress Notes (Signed)
[facility]             Progress Note   Subjective: "I feel terrible!". Continues with cough and dry heaves. Overnight events noted.  Objective: Vital signs in last 24 hours: Temp:  [98 F (36.7 C)-99.8 F (37.7 C)] 98.1 F (36.7 C) (10/28 1300) Pulse Rate:  [87-97] 88  (10/28 1300) Resp:  [18-30] 20  (10/28 1300) BP: (54-123)/(32-79) 123/72 mmHg (10/28 1300) SpO2:  [91 %-100 %] 95 % (10/28 1300) Weight:  [88.6 kg (195 lb 5.2 oz)] 88.6 kg (195 lb 5.2 oz) (10/27 2300) Weight change:  Last BM Date: 06/11/12  Intake/Output from previous day: 10/27 0701 - 10/28 0700 In: 2027 [P.O.:50; I.V.:525; IV Piggyback:1452] Out: [id number] [Urine:600; Emesis/NG output:75] Total I/O In: [id number] [I.V.:525; IV Piggyback:350] Out: [id number] [Urine:1550]   Physical Exam: General: Alert, awake, oriented x3. HEENT: No bruits, no goiter. Heart: Regular rate and rhythm, without murmurs, rubs, gallops. Lungs: Ronchi right base (new). Abdomen: Soft, nontender, nondistended, positive bowel sounds. Extremities: No clubbing cyanosis or edema with positive pedal pulses. Neuro: Grossly intact, nonfocal.    Lab Results: Basic Metabolic Panel:  Basename 06/13/12 0922 06/13/12 0040 06/12/12 1609  NA 138 -- 136  K 4.2 -- 2.6*  CL 107 -- 99  CO2 20 -- 25  GLUCOSE 95 -- 146*  BUN 14 -- 12  CREATININE 0.85 -- 0.90  CALCIUM 8.0* -- 9.0  MG -- 1.1* --  PHOS -- -- --   Liver Function Tests:  [facility] 06/12/12 1609  AST 32  ALT 20  ALKPHOS 123*  BILITOT 0.7  PROT 7.1  ALBUMIN 3.4*   CBC:  Basename 06/13/12 0922 06/12/12 1609  WBC 17.6* 11.5*  NEUTROABS -- 10.0*  HGB 11.4* 12.1  HCT 34.0* 37.6  MCV 84.2 85.6  PLT 157 184   Cardiac Enzymes:  Basename 06/12/12 1609  CKTOTAL --  CKMB --  CKMBINDEX --  TROPONINI <0.30   Urinalysis:  Basename 06/12/12 1845  COLORURINE AMBER*  LABSPEC 1.013  PHURINE 7.0  GLUCOSEU NEGATIVE  HGBUR NEGATIVE  BILIRUBINUR NEGATIVE  KETONESUR  NEGATIVE  PROTEINUR NEGATIVE  UROBILINOGEN 0.2  NITRITE NEGATIVE  LEUKOCYTESUR NEGATIVE    Recent Results (from the past 240 hour(s))  CULTURE, BLOOD (ROUTINE X 2)     Status: Normal (Preliminary result)   Collection Time   06/12/12  4:09 PM      Component Value Range Status Comment   Specimen Description BLOOD RIGHT HAND   Final    Special Requests BOTTLES DRAWN AEROBIC AND ANAEROBIC 5 CC EACH   Final    Culture  Setup Time 06/12/2012 22:33   Final    Culture     Final    Value:        BLOOD CULTURE RECEIVED NO GROWTH TO DATE CULTURE WILL BE HELD FOR 5 DAYS BEFORE ISSUING A FINAL NEGATIVE REPORT   Report Status PENDING   Incomplete   CULTURE, BLOOD (ROUTINE X 2)     Status: Normal (Preliminary result)   Collection Time   06/12/12  4:22 PM      Component Value Range Status Comment   Specimen Description BLOOD RIGHT WRIST   Final    Special Requests BOTTLES DRAWN AEROBIC AND ANAEROBIC 5 CC EACH   Final    Culture  Setup Time 06/12/2012 22:33   Final    Culture     Final    Value:        BLOOD CULTURE RECEIVED  NO GROWTH TO DATE CULTURE WILL BE HELD FOR 5 DAYS BEFORE ISSUING A FINAL NEGATIVE REPORT   Report Status PENDING   Incomplete   MRSA PCR SCREENING     Status: Abnormal   Collection Time   06/12/12 11:12 PM      Component Value Range Status Comment   MRSA by PCR POSITIVE (*) NEGATIVE Final   CULTURE, EXPECTORATED SPUTUM-ASSESSMENT     Status: Normal   Collection Time   06/13/12 12:45 AM      Component Value Range Status Comment   Specimen Description SPUTUM   Final    Special Requests Immunocompromised   Final    Sputum evaluation     Final    Value: THIS SPECIMEN IS ACCEPTABLE. RESPIRATORY CULTURE REPORT TO FOLLOW.   Report Status 06/13/2012 FINAL   Final   CULTURE, RESPIRATORY     Status: Normal (Preliminary result)   Collection Time   06/13/12 12:45 AM      Component Value Range Status Comment   Specimen Description SPUTUM   Final    Special Requests NONE   Final     Gram Stain     Final    Value: MODERATE WBC PRESENT,BOTH PMN AND MONONUCLEAR     NO SQUAMOUS EPITHELIAL CELLS SEEN     NO ORGANISMS SEEN   Culture PENDING   Incomplete    Report Status PENDING   Incomplete     Studies/Results: Dg Chest 1 View  06/13/2012  *RADIOLOGY REPORT*  Clinical Data: Spider bite.  Evaluate for pneumonia.  CHEST - 1 VIEW  Comparison: 06/12/2012 and CT chest 09/16/2010.  Findings: Trachea is midline.  Heart size stable.  There is added density in the right infrahilar region.  Lungs are low in volume but otherwise clear.  No pleural fluid.  IMPRESSION: Added density in the right infrahilar region may be due to slight rotation and portable semi upright technique.  Reevaluation with PA and lateral views, when the patient is able, is recommended.   Original Report Authenticated By: Luretha Rued, M.D.    Dg Chest 2 View  06/12/2012  *RADIOLOGY REPORT*  Clinical Data: Nausea with vomiting and fever.  History of myocardial infarction and CABG.  CHEST - 2 VIEW  Comparison: 12/23/2011.  Findings: The heart size and mediastinal contours are stable status post CABG.  Several of the sternotomy wires are chronically fractured.  There are lower lung volumes with new patchy perihilar opacities bilaterally.  No edema or confluent airspace opacity is present.  There is no pleural effusion.  IMPRESSION: Low lung volumes with increased probable perihilar atelectasis.  No consolidation or definite edema.   Original Report Authenticated By: Vivia Ewing, M.D.    Dg Abd 2 Views  06/12/2012  *RADIOLOGY REPORT*  Clinical Data: Abdominal pain  ABDOMEN - 2 VIEW  Comparison: CT of the abdomen and pelvis with contrast 06/04/2008.  Findings: The lung bases are clear.  A gastric band is again noted within satisfactory position.  The bowel gas pattern is unremarkable.  There is no evidence for obstruction or free air. The axial skeleton is unremarkable.  IMPRESSION:  1.  Status post gastric  banding.  The device is in a satisfactory position. 2.  No acute abnormality.   Original Report Authenticated By: Resa Miner. MATTERN, M.D.     Medications: Scheduled Meds:    . sodium chloride   Intravenous STAT  . ampicillin-sulbactam (UNASYN) IV  3 g Intravenous Q6H  . aspirin  81 mg Oral Daily  . azaTHIOprine  50 mg Oral Daily  . cefTRIAXone (ROCEPHIN)  IV  2 g Intravenous Q24H  . Chlorhexidine Gluconate Cloth  6 each Topical Q0600  . clopidogrel  75 mg Oral Q breakfast  . DULoxetine  30 mg Oral Daily  . enoxaparin (LOVENOX) injection  40 mg Subcutaneous QHS  . LORazepam  1 mg Oral Q8H  . magnesium sulfate 1 - 4 g bolus IVPB  2 g Intravenous Once  . magnesium sulfate 1 - 4 g bolus IVPB  2 g Intravenous Once  . montelukast  10 mg Oral QHS  . mupirocin ointment  1 application Nasal BID  . nystatin  5 mL Oral QID  . olopatadine  1 drop Both Eyes Daily  . pantoprazole  40 mg Oral Daily  . potassium chloride  10 mEq Intravenous Once  . potassium chloride  40 mEq Oral Once  . potassium chloride  40 mEq Oral Q4H  . predniSONE  5 mg Oral Q breakfast  . sodium chloride  1,000 mL Intravenous Once  . sodium chloride  1,000 mL Intravenous Once  . sodium chloride  1,000 mL Intravenous Once  . vancomycin  1,000 mg Intravenous Q12H  . DISCONTD: magnesium sulfate  2 g Intravenous Once   Continuous Infusions:    . 0.9 % NaCl with KCl 20 mEq / L 75 mL/hr at 06/12/12 2251   PRN Meds:.acetaminophen, acetaminophen, ondansetron (ZOFRAN) IV, ondansetron, oxyCODONE, senna-docusate  Assessment/Plan:  Principal Problem:  *Fever Active Problems:  OBSTRUCTIVE SLEEP APNEA  Hypokalemia  PNA (pneumonia)  Cellulitis  Renal transplant disorder  Leukocytosis  Hypomagnesemia    Fever -Afebrile since admission. -2/2 cellulitis and now evidence of PNA on CXR.. -Continue current antibiotics of vanc/unasyn Day 2. -All culture data negative to date.  PNA -Repeat CXR after hydration  shows a new infiltrate. -This makes sense with her fever, leukocytosis and productive cough. -Procalcitonin quite elevated at 66 would indicate a bacterial infection. -Current antibiotics are appropriate.  Left Upper Arm Cellulitis -Appears improved today.  Hypokalemia -Repleted.  Hypomagnesemia -1.1. -2 gr of Mag Sulfate. -Recheck in am.  Leukocytosis -2/2 PNA + cellulitis. -Recheck in am.  Renal Transplant -Continue prednisone/imuran.   Time spent coordinating care: 35 minutes.   LOS: 1 day   [facility] Pager: [phone] 06/13/2012, 4:06 PM

## 2012-06-14 DIAGNOSIS — R05 Cough: Secondary | ICD-10-CM

## 2012-06-14 LAB — BASIC METABOLIC PANEL
BUN: 11 mg/dL (ref 6–23)
CO2: 22 mEq/L (ref 19–32)
Chloride: 104 mEq/L (ref 96–112)
Creatinine, Ser: 0.66 mg/dL (ref 0.50–1.10)
GFR calc Af Amer: >90 mL/min (ref >90)
Potassium: 3.6 mEq/L (ref 3.5–5.1)

## 2012-06-14 LAB — CBC
HCT: 33.6 % — ABNORMAL LOW (ref 36.0–46.0)
Hemoglobin: 11 g/dL — ABNORMAL LOW (ref 12.0–15.0)
MCHC: 32.7 g/dL (ref 30.0–36.0)
MCV: 84.6 fL (ref 78.0–100.0)

## 2012-06-14 MED ORDER — ONDANSETRON 8 MG PO TBDP
8.0000 mg | ORAL_TABLET | Freq: Three times a day (TID) | ORAL | Status: DC | PRN
  Administered 2012-06-14 – 2012-06-17 (×2): 8 mg via ORAL
  Filled 2012-06-14 (×2): qty 1

## 2012-06-14 NOTE — Progress Notes (Addendum)
Spoke to Dr Maryland Pink about IV issues. Patient's iv infiltrated and 2 members of IV team attempted to restart without success. Another IV team member can not attempt per their policy. MD stated to call Dr Jerilee Hoh at [phone]. Told oncoming RN Hoyle Sauer to page MD at [phone].

## 2012-06-14 NOTE — Progress Notes (Signed)
[facility]             Progress Note   Subjective: Still with dry heaves. Severe body aches.  Objective: Vital signs in last 24 hours: Temp:  [98.3 F (36.8 C)-99.7 F (37.6 C)] 98.6 F (37 C) (10/29 0945) Pulse Rate:  [82-96] 82  (10/29 0945) Resp:  [18-21] 18  (10/29 0500) BP: (113-141)/(58-73) 113/58 mmHg (10/29 0945) SpO2:  [93 %-98 %] 97 % (10/29 0945) Weight change:  Last BM Date: 06/12/12  Intake/Output from previous day: 10/28 0701 - 10/29 0700 In: 4095 [P.O.:840; I.V.:2025; IV Piggyback:702] Out: 2950 [Urine:2950] Total I/O In: -  Out: 550 [Urine:550]   Physical Exam: General: Alert, awake, oriented x3. HEENT: No bruits, no goiter. Heart: Regular rate and rhythm, without murmurs, rubs, gallops. Lungs: Ronchi right base (new). Abdomen: Soft, nontender, nondistended, positive bowel sounds. Extremities: No clubbing cyanosis or edema with positive pedal pulses. Neuro: Grossly intact, nonfocal.    Lab Results: Basic Metabolic Panel:  Basename 06/14/12 0400 06/13/12 0922 06/13/12 0040  NA 136 138 --  K 3.6 4.2 --  CL 104 107 --  CO2 22 20 --  GLUCOSE 98 95 --  BUN 11 14 --  CREATININE 0.66 0.85 --  CALCIUM 8.7 8.0* --  MG 2.1 -- 1.1*  PHOS -- -- --   Liver Function Tests:  Basename 06/12/12 1609  AST 32  ALT 20  ALKPHOS 123*  BILITOT 0.7  PROT 7.1  ALBUMIN 3.4*   CBC:  Basename 06/14/12 0400 06/13/12 0922 06/12/12 1609  WBC 13.2* 17.6* --  NEUTROABS -- -- 10.0*  HGB 11.0* 11.4* --  HCT 33.6* 34.0* --  MCV 84.6 84.2 --  PLT 180 157 --   Cardiac Enzymes:  Basename 06/12/12 1609  CKTOTAL --  CKMB --  CKMBINDEX --  TROPONINI <0.30   Urinalysis:  Basename 06/12/12 1845  COLORURINE AMBER*  LABSPEC 1.013  PHURINE 7.0  GLUCOSEU NEGATIVE  HGBUR NEGATIVE  BILIRUBINUR NEGATIVE  KETONESUR NEGATIVE  PROTEINUR NEGATIVE  UROBILINOGEN 0.2  NITRITE NEGATIVE  LEUKOCYTESUR NEGATIVE    Recent Results (from the past 240  hour(s))  CULTURE, BLOOD (ROUTINE X 2)     Status: Normal (Preliminary result)   Collection Time   06/12/12  4:09 PM      Component Value Range Status Comment   Specimen Description BLOOD RIGHT HAND   Final    Special Requests BOTTLES DRAWN AEROBIC AND ANAEROBIC 5 CC EACH   Final    Culture  Setup Time 06/12/2012 22:33   Final    Culture     Final    Value:        BLOOD CULTURE RECEIVED NO GROWTH TO DATE CULTURE WILL BE HELD FOR 5 DAYS BEFORE ISSUING A FINAL NEGATIVE REPORT   Report Status PENDING   Incomplete   CULTURE, BLOOD (ROUTINE X 2)     Status: Normal (Preliminary result)   Collection Time   06/12/12  4:22 PM      Component Value Range Status Comment   Specimen Description BLOOD RIGHT WRIST   Final    Special Requests BOTTLES DRAWN AEROBIC AND ANAEROBIC 5 CC EACH   Final    Culture  Setup Time 06/12/2012 22:33   Final    Culture     Final    Value:        BLOOD CULTURE RECEIVED NO GROWTH TO DATE CULTURE WILL BE HELD FOR 5 DAYS BEFORE ISSUING A FINAL NEGATIVE REPORT  Report Status PENDING   Incomplete   URINE CULTURE     Status: Normal   Collection Time   06/12/12  6:46 PM      Component Value Range Status Comment   Specimen Description URINE, CLEAN CATCH   Final    Special Requests NONE   Final    Culture  Setup Time 06/12/2012 22:35   Final    Colony Count 9,000 COLONIES/ML   Final    Culture INSIGNIFICANT GROWTH   Final    Report Status 06/13/2012 FINAL   Final   MRSA PCR SCREENING     Status: Abnormal   Collection Time   06/12/12 11:12 PM      Component Value Range Status Comment   MRSA by PCR POSITIVE (*) NEGATIVE Final   CULTURE, EXPECTORATED SPUTUM-ASSESSMENT     Status: Normal   Collection Time   06/13/12 12:45 AM      Component Value Range Status Comment   Specimen Description SPUTUM   Final    Special Requests Immunocompromised   Final    Sputum evaluation     Final    Value: THIS SPECIMEN IS ACCEPTABLE. RESPIRATORY CULTURE REPORT TO FOLLOW.   Report  Status 06/13/2012 FINAL   Final   CULTURE, RESPIRATORY     Status: Normal (Preliminary result)   Collection Time   06/13/12 12:45 AM      Component Value Range Status Comment   Specimen Description SPUTUM   Final    Special Requests NONE   Final    Gram Stain     Final    Value: MODERATE WBC PRESENT,BOTH PMN AND MONONUCLEAR     NO SQUAMOUS EPITHELIAL CELLS SEEN     NO ORGANISMS SEEN   Culture NORMAL OROPHARYNGEAL FLORA   Final    Report Status PENDING   Incomplete     Studies/Results: Dg Chest 1 View  06/13/2012  *RADIOLOGY REPORT*  Clinical Data: Spider bite.  Evaluate for pneumonia.  CHEST - 1 VIEW  Comparison: 06/12/2012 and CT chest 09/16/2010.  Findings: Trachea is midline.  Heart size stable.  There is added density in the right infrahilar region.  Lungs are low in volume but otherwise clear.  No pleural fluid.  IMPRESSION: Added density in the right infrahilar region may be due to slight rotation and portable semi upright technique.  Reevaluation with PA and lateral views, when the patient is able, is recommended.   Original Report Authenticated By: Luretha Rued, M.D.    Dg Chest 2 View  06/12/2012  *RADIOLOGY REPORT*  Clinical Data: Nausea with vomiting and fever.  History of myocardial infarction and CABG.  CHEST - 2 VIEW  Comparison: 12/23/2011.  Findings: The heart size and mediastinal contours are stable status post CABG.  Several of the sternotomy wires are chronically fractured.  There are lower lung volumes with new patchy perihilar opacities bilaterally.  No edema or confluent airspace opacity is present.  There is no pleural effusion.  IMPRESSION: Low lung volumes with increased probable perihilar atelectasis.  No consolidation or definite edema.   Original Report Authenticated By: Vivia Ewing, M.D.    Dg Abd 2 Views  06/12/2012  *RADIOLOGY REPORT*  Clinical Data: Abdominal pain  ABDOMEN - 2 VIEW  Comparison: CT of the abdomen and pelvis with contrast 06/04/2008.   Findings: The lung bases are clear.  A gastric band is again noted within satisfactory position.  The bowel gas pattern is unremarkable.  There is no  evidence for obstruction or free air. The axial skeleton is unremarkable.  IMPRESSION:  1.  Status post gastric banding.  The device is in a satisfactory position. 2.  No acute abnormality.   Original Report Authenticated By: Resa Miner. MATTERN, M.D.     Medications: Scheduled Meds:    . sodium chloride   Intravenous STAT  . ampicillin-sulbactam (UNASYN) IV  3 g Intravenous Q6H  . aspirin  81 mg Oral Daily  . azaTHIOprine  50 mg Oral Daily  . Chlorhexidine Gluconate Cloth  6 each Topical Q0600  . clopidogrel  75 mg Oral Q breakfast  . DULoxetine  30 mg Oral Daily  . enoxaparin (LOVENOX) injection  40 mg Subcutaneous QHS  . LORazepam  1 mg Oral Q8H  . montelukast  10 mg Oral QHS  . mupirocin ointment  1 application Nasal BID  . nystatin  5 mL Oral QID  . olopatadine  1 drop Both Eyes Daily  . pantoprazole  40 mg Oral Daily  . predniSONE  5 mg Oral Q breakfast  . sodium chloride  1,000 mL Intravenous Once  . vancomycin  1,000 mg Intravenous Q12H  . DISCONTD: cefTRIAXone (ROCEPHIN)  IV  2 g Intravenous Q24H   Continuous Infusions:    . 0.9 % NaCl with KCl 20 mEq / L 75 mL/hr at 06/13/12 1658   PRN Meds:.acetaminophen, acetaminophen, ondansetron (ZOFRAN) IV, ondansetron, ondansetron, oxyCODONE, senna-docusate  Assessment/Plan:  Principal Problem:  *Fever Active Problems:  OBSTRUCTIVE SLEEP APNEA  Hypokalemia  PNA (pneumonia)  Cellulitis  Renal transplant disorder  Leukocytosis  Hypomagnesemia    Fever -Afebrile since admission. -2/2 cellulitis and now evidence of PNA on CXR.. -Continue current antibiotics of vanc/unasyn Day 3. -All culture data negative to date. -Her symptoms also make me wonder if she may have flu. -Will order influenza PCR.  PNA -Repeat CXR after hydration shows a new infiltrate. -This makes  sense with her fever, leukocytosis and productive cough. -Procalcitonin quite elevated at 66 would indicate a bacterial infection. -Current antibiotics are appropriate. Lavone Orn).  Left Upper Arm Cellulitis -Appears almost resolved on exam today.  Hypokalemia -Repleted.  Hypomagnesemia -Repleted.  Leukocytosis -2/2 PNA + cellulitis. -Improving on antibiotics.  Renal Transplant -Continue prednisone/imuran.  Disposition -Has been having IV access issues. -Will request a PICC placed by radiology as the IV team refuses to do because of her history of a renal transplant.   Time spent coordinating care: 35 minutes.   LOS: 2 days   [facility] Pager: [phone] 06/14/2012, 2:29 PM

## 2012-06-15 LAB — INFLUENZA PANEL BY PCR (TYPE A & B): Influenza A By PCR: NEGATIVE

## 2012-06-15 LAB — BASIC METABOLIC PANEL
CO2: 21 mEq/L (ref 19–32)
Calcium: 8.5 mg/dL (ref 8.4–10.5)
Creatinine, Ser: 0.62 mg/dL (ref 0.50–1.10)
GFR calc Af Amer: >90 mL/min (ref >90)
Glucose, Bld: 116 mg/dL — ABNORMAL HIGH (ref 70–99)

## 2012-06-15 LAB — LEGIONELLA ANTIGEN, URINE: Legionella Antigen, Urine: NEGATIVE

## 2012-06-15 LAB — CBC
HCT: 29.8 % — ABNORMAL LOW (ref 36.0–46.0)
Hemoglobin: 9.7 g/dL — ABNORMAL LOW (ref 12.0–15.0)
RBC: 3.46 MIL/uL — ABNORMAL LOW (ref 3.87–5.11)
WBC: 7.5 10*3/uL (ref 4.0–10.5)

## 2012-06-15 LAB — STREP PNEUMONIAE URINARY ANTIGEN: Strep Pneumo Urinary Antigen: NEGATIVE

## 2012-06-15 LAB — CULTURE, RESPIRATORY W GRAM STAIN

## 2012-06-15 MED ORDER — GUAIFENESIN 100 MG/5ML PO SYRP
200.0000 mg | ORAL_SOLUTION | ORAL | Status: DC | PRN
  Filled 2012-06-15 (×2): qty 118

## 2012-06-15 MED ORDER — SODIUM CHLORIDE 0.9 % IV SOLN
INTRAVENOUS | Status: DC
  Administered 2012-06-15: 10 mL/h via INTRAVENOUS

## 2012-06-15 MED ORDER — GUAIFENESIN 100 MG/5ML PO SOLN
10.0000 mL | ORAL | Status: DC | PRN
  Administered 2012-06-15 – 2012-06-16 (×4): 200 mg via ORAL
  Filled 2012-06-15 (×4): qty 10

## 2012-06-15 MED ORDER — POTASSIUM CHLORIDE CRYS ER 20 MEQ PO TBCR
40.0000 meq | EXTENDED_RELEASE_TABLET | Freq: Once | ORAL | Status: AC
  Administered 2012-06-15: 40 meq via ORAL
  Filled 2012-06-15: qty 2

## 2012-06-15 MED ORDER — LEVALBUTEROL HCL 0.63 MG/3ML IN NEBU
0.6300 mg | INHALATION_SOLUTION | Freq: Four times a day (QID) | RESPIRATORY_TRACT | Status: DC | PRN
  Administered 2012-06-15: 0.63 mg via RESPIRATORY_TRACT
  Filled 2012-06-15: qty 3

## 2012-06-15 MED ORDER — LEVALBUTEROL HCL 0.63 MG/3ML IN NEBU
0.6300 mg | INHALATION_SOLUTION | Freq: Two times a day (BID) | RESPIRATORY_TRACT | Status: DC
  Filled 2012-06-15 (×2): qty 3

## 2012-06-15 MED ORDER — LEVALBUTEROL HCL 0.63 MG/3ML IN NEBU
0.6300 mg | INHALATION_SOLUTION | RESPIRATORY_TRACT | Status: DC | PRN
  Administered 2012-06-15 – 2012-06-16 (×2): 0.63 mg via RESPIRATORY_TRACT
  Filled 2012-06-15: qty 3

## 2012-06-15 MED ORDER — BUDESONIDE 0.5 MG/2ML IN SUSP
0.5000 mg | Freq: Two times a day (BID) | RESPIRATORY_TRACT | Status: DC
  Administered 2012-06-15 – 2012-06-17 (×4): 0.5 mg via RESPIRATORY_TRACT
  Filled 2012-06-15 (×7): qty 2

## 2012-06-15 MED ORDER — LEVALBUTEROL HCL 0.63 MG/3ML IN NEBU
0.6300 mg | INHALATION_SOLUTION | Freq: Two times a day (BID) | RESPIRATORY_TRACT | Status: DC
  Administered 2012-06-15 – 2012-06-17 (×4): 0.63 mg via RESPIRATORY_TRACT
  Filled 2012-06-15 (×6): qty 3

## 2012-06-15 NOTE — Progress Notes (Signed)
PHARMACY BRIEF NOTE - IV VANCOMYCIN  Assisting with Vancomycin therapy for this 56 year-old female with possible pneumonia/bronchitis, left arm cellulitis, and immunosuppression.  The current Vancomycin dose is 1000 mg IV every 12 hours.  The trough level drawn prior to the 16:00 dose today is reported as 14.9 mcg/ml.  This level is at the borderline of the desired range, 15-20 mcg/ml.    Plan: Continue the current dose.  ButlerPh. 06/15/2012 5:28 PM

## 2012-06-15 NOTE — Progress Notes (Signed)
[facility] PROGRESS NOTE  LAGRETTA [name] [id number] DOB: [birth year] DOA: 06/12/2012 PCP: Placido Sou, MD  Assessment/Plan: 1. Suspected pneumonia--improving. Wean oxygen. 2. Cellulitis [name]--resolved. 3. Hypotension/possible sepsis on admission--resolved. Possible related to relative adrenal insufficiency. Continue antibiotics. 4. S/p kidney transplant--stable, continue Imuran, prednisone. 5. S/p gastric bypass  Code Status: full code Family Communication: discussed with husband at bedside Disposition Plan: home 2-3 days  Murray Hodgkins, MD  [facility] Team 6 Pager [phone]. If 8PM-8AM, please contact night-coverage at [email or web address], password [facility] 06/15/2012, 6:03 PM  LOS: 3 days   Brief narrative: 56 year old woman PMH kidney transplant admitted for fever.  Consultants:  None  Procedures:  PICC  Antibiotics:  Vancomycin 10/27 >>  Unasyn 10/27 >>  HPI/Subjective: Feels better, still coughing and SOB.  Objective: Filed Vitals:   06/14/12 2106 06/15/12 0529 06/15/12 0750 06/15/12 1400  BP: 125/75 121/64 125/79 128/70  Pulse: 87 79 66 70  Temp: 98.7 F (37.1 C) 97.9 F (36.6 C) 97.8 F (36.6 C) 98.4 F (36.9 C)  TempSrc: Oral Oral Oral Oral  Resp: 18 18 16 18   Height:      Weight:      SpO2: 94% 100% 98% 98%    Intake/Output Summary (Last 24 hours) at 06/15/12 1803 Last data filed at 06/15/12 1000  Gross per 24 hour  Intake   2935 ml  Output   1375 ml  Net   1560 ml   Filed Weights   06/12/12 2300  Weight: 88.6 kg (195 lb 5.2 oz)    Exam:  General:  Appears calm and comfortable Cardiovascular: RRR, no m/r/g. No LE edema. Respiratory: good air movement, some right-sided crackles, no rhonchi/rales. Normal respiratory effort. Psychiatric: grossly normal mood and affect, speech fluent and appropriate  Data Reviewed: Basic Metabolic Panel:  Lab [id number] 0401 06/14/12 0400 06/13/12 0922 06/13/12 0040 06/12/12 1609  NA  135 136 138 -- 136  K 3.2* 3.6 4.2 -- 2.6*  CL 104 104 107 -- 99  CO2 21 22 20  -- 25  GLUCOSE 116* 98 95 -- 146*  BUN 9 11 14  -- 12  CREATININE 0.62 0.66 0.85 -- 0.90  CALCIUM 8.5 8.7 8.0* -- 9.0  MG -- 2.1 -- 1.1* --  PHOS -- -- -- -- --   Liver Function Tests:  Lab 06/12/12 1609  AST 32  ALT 20  ALKPHOS 123*  BILITOT 0.7  PROT 7.1  ALBUMIN 3.4*   CBC:  Lab 06/15/12 0401 06/14/12 0400 06/13/12 0922 06/12/12 1609  WBC 7.5 13.2* 17.6* 11.5*  NEUTROABS -- -- -- 10.0*  HGB 9.7* 11.0* 11.4* 12.1  HCT 29.8* 33.6* 34.0* 37.6  MCV 86.1 84.6 84.2 85.6  PLT 162 180 157 184   Cardiac Enzymes:  Lab 06/12/12 1609  CKTOTAL --  CKMB --  CKMBINDEX --  TROPONINI <0.30    Recent Results (from the past 240 hour(s))  CULTURE, BLOOD (ROUTINE X 2)     Status: Normal (Preliminary result)   Collection Time   06/12/12  4:09 PM      Component Value Range Status Comment   Specimen Description BLOOD RIGHT HAND   Final    Special Requests BOTTLES DRAWN AEROBIC AND ANAEROBIC 5 CC EACH   Final    Culture  Setup Time 06/12/2012 22:33   Final    Culture     Final    Value:        BLOOD CULTURE RECEIVED NO GROWTH TO DATE CULTURE WILL BE  HELD FOR 5 DAYS BEFORE ISSUING A FINAL NEGATIVE REPORT   Report Status PENDING   Incomplete   CULTURE, BLOOD (ROUTINE X 2)     Status: Normal (Preliminary result)   Collection Time   06/12/12  4:22 PM      Component Value Range Status Comment   Specimen Description BLOOD RIGHT WRIST   Final    Special Requests BOTTLES DRAWN AEROBIC AND ANAEROBIC 5 CC EACH   Final    Culture  Setup Time 06/12/2012 22:33   Final    Culture     Final    Value:        BLOOD CULTURE RECEIVED NO GROWTH TO DATE CULTURE WILL BE HELD FOR 5 DAYS BEFORE ISSUING A FINAL NEGATIVE REPORT   Report Status PENDING   Incomplete   URINE CULTURE     Status: Normal   Collection Time   06/12/12  6:46 PM      Component Value Range Status Comment   Specimen Description URINE, CLEAN CATCH    Final    Special Requests NONE   Final    Culture  Setup Time 06/12/2012 22:35   Final    Colony Count 9,000 COLONIES/ML   Final    Culture INSIGNIFICANT GROWTH   Final    Report Status 06/13/2012 FINAL   Final   MRSA PCR SCREENING     Status: Abnormal   Collection Time   06/12/12 11:12 PM      Component Value Range Status Comment   MRSA by PCR POSITIVE (*) NEGATIVE Final   CULTURE, EXPECTORATED SPUTUM-ASSESSMENT     Status: Normal   Collection Time   06/13/12 12:45 AM      Component Value Range Status Comment   Specimen Description SPUTUM   Final    Special Requests Immunocompromised   Final    Sputum evaluation     Final    Value: THIS SPECIMEN IS ACCEPTABLE. RESPIRATORY CULTURE REPORT TO FOLLOW.   Report Status 06/13/2012 FINAL   Final   CULTURE, RESPIRATORY     Status: Normal   Collection Time   06/13/12 12:45 AM      Component Value Range Status Comment   Specimen Description SPUTUM   Final    Special Requests NONE   Final    Gram Stain     Final    Value: MODERATE WBC PRESENT,BOTH PMN AND MONONUCLEAR     NO SQUAMOUS EPITHELIAL CELLS SEEN     NO ORGANISMS SEEN   Culture FEW CANDIDA ALBICANS   Final    Report Status 06/15/2012 FINAL   Final      Studies: No results found.  Scheduled Meds:   . ampicillin-sulbactam (UNASYN) IV  3 g Intravenous Q6H  . aspirin  81 mg Oral Daily  . azaTHIOprine  50 mg Oral Daily  . budesonide  0.5 mg Nebulization BID  . Chlorhexidine Gluconate Cloth  6 each Topical Q0600  . clopidogrel  75 mg Oral Q breakfast  . DULoxetine  30 mg Oral Daily  . enoxaparin (LOVENOX) injection  40 mg Subcutaneous QHS  . levalbuterol  0.63 mg Nebulization BID  . LORazepam  1 mg Oral Q8H  . montelukast  10 mg Oral QHS  . mupirocin ointment  1 application Nasal BID  . nystatin  5 mL Oral QID  . olopatadine  1 drop Both Eyes Daily  . pantoprazole  40 mg Oral Daily  . potassium chloride  40 mEq Oral Once  .  predniSONE  5 mg Oral Q breakfast  . sodium  chloride  1,000 mL Intravenous Once  . vancomycin  1,000 mg Intravenous Q12H  . DISCONTD: levalbuterol  0.63 mg Nebulization BID   Continuous Infusions:   . DISCONTD: 0.9 % NaCl with KCl 20 mEq / L 75 mL/hr at 06/14/12 1655    Principal Problem:  *Fever Active Problems:  OBSTRUCTIVE SLEEP APNEA  Hypokalemia  PNA (pneumonia)  Cellulitis  Renal transplant disorder  Leukocytosis  Hypomagnesemia     Murray Hodgkins, MD  [facility] Team 6 Pager [phone]. If 8PM-8AM, please contact night-coverage at [email or web address], password [facility] 06/15/2012, 6:03 PM  LOS: 3 days   Time spent: 25 minutes

## 2012-06-15 NOTE — Progress Notes (Signed)
ANTIBIOTIC CONSULT NOTE - Follow-up  Pharmacy Consult for Vancomycin, Unasyn Indication: Possible PNA/Bronchitis & Cellulitis of left upper arm  Allergies  Allergen Reactions  . Codeine Nausea And Vomiting  . Erythromycin Nausea And Vomiting  . Hydromorphone Hcl Nausea And Vomiting  . Nalbuphine Nausea And Vomiting  . Sulfonamide Derivatives Other (See Comments)    Reaction: per patient "tears her stomach up"  . Tetracycline Hives    Patient Measurements: Height: 5' 1.02" (155 cm) Weight: 195 lb 5.2 oz (88.6 kg) IBW/kg (Calculated) : 47.86    Vital Signs: Temp: 97.9 F (36.6 C) (10/30 0529) Temp src: Oral (10/30 0529) BP: 121/64 mmHg (10/30 0529) Pulse Rate: 79  (10/30 0529) Intake/Output from previous day: 10/29 0701 - 10/30 0700 In: 120 [P.O.:120] Out: 1000 [Urine:1000] Intake/Output from this shift:    Labs:  Basename 06/15/12 0401 06/14/12 0400 06/13/12 0922  WBC 7.5 13.2* 17.6*  HGB 9.7* 11.0* 11.4*  PLT 162 180 157  LABCREA -- -- --  CREATININE 0.62 0.66 0.85   Estimated Creatinine Clearance: 81.5 ml/min (by C-G formula based on Cr of 0.62). No results found for this basename: VANCOTROUGH:2,VANCOPEAK:2,VANCORANDOM:2,GENTTROUGH:2,GENTPEAK:2,GENTRANDOM:2,TOBRATROUGH:2,TOBRAPEAK:2,TOBRARND:2,AMIKACINPEAK:2,AMIKACINTROU:2,AMIKACIN:2, in the last 72 hours   Microbiology: Recent Results (from the past 720 hour(s))  CULTURE, BLOOD (ROUTINE X 2)     Status: Normal (Preliminary result)   Collection Time   06/12/12  4:09 PM      Component Value Range Status Comment   Specimen Description BLOOD RIGHT HAND   Final    Special Requests BOTTLES DRAWN AEROBIC AND ANAEROBIC 5 CC EACH   Final    Culture  Setup Time 06/12/2012 22:33   Final    Culture     Final    Value:        BLOOD CULTURE RECEIVED NO GROWTH TO DATE CULTURE WILL BE HELD FOR 5 DAYS BEFORE ISSUING A FINAL NEGATIVE REPORT   Report Status PENDING   Incomplete   CULTURE, BLOOD (ROUTINE X 2)     Status:  Normal (Preliminary result)   Collection Time   06/12/12  4:22 PM      Component Value Range Status Comment   Specimen Description BLOOD RIGHT WRIST   Final    Special Requests BOTTLES DRAWN AEROBIC AND ANAEROBIC 5 CC EACH   Final    Culture  Setup Time 06/12/2012 22:33   Final    Culture     Final    Value:        BLOOD CULTURE RECEIVED NO GROWTH TO DATE CULTURE WILL BE HELD FOR 5 DAYS BEFORE ISSUING A FINAL NEGATIVE REPORT   Report Status PENDING   Incomplete   URINE CULTURE     Status: Normal   Collection Time   06/12/12  6:46 PM      Component Value Range Status Comment   Specimen Description URINE, CLEAN CATCH   Final    Special Requests NONE   Final    Culture  Setup Time 06/12/2012 22:35   Final    Colony Count 9,000 COLONIES/ML   Final    Culture INSIGNIFICANT GROWTH   Final    Report Status 06/13/2012 FINAL   Final   MRSA PCR SCREENING     Status: Abnormal   Collection Time   06/12/12 11:12 PM      Component Value Range Status Comment   MRSA by PCR POSITIVE (*) NEGATIVE Final   CULTURE, EXPECTORATED SPUTUM-ASSESSMENT     Status: Normal   Collection Time  06/13/12 12:45 AM      Component Value Range Status Comment   Specimen Description SPUTUM   Final    Special Requests Immunocompromised   Final    Sputum evaluation     Final    Value: THIS SPECIMEN IS ACCEPTABLE. RESPIRATORY CULTURE REPORT TO FOLLOW.   Report Status 06/13/2012 FINAL   Final   CULTURE, RESPIRATORY     Status: Normal   Collection Time   06/13/12 12:45 AM      Component Value Range Status Comment   Specimen Description SPUTUM   Final    Special Requests NONE   Final    Gram Stain     Final    Value: MODERATE WBC PRESENT,BOTH PMN AND MONONUCLEAR     NO SQUAMOUS EPITHELIAL CELLS SEEN     NO ORGANISMS SEEN   Culture FEW CANDIDA ALBICANS   Final    Report Status 06/15/2012 FINAL   Final     Medical History: Past Medical History  Diagnosis Date  . Chronic kidney disease   . Heart attack   .  Hypertension   . Allergy   . Fibromyalgia   . Boils     lft and rt thigh, lft groin  . MRSA (methicillin resistant Staphylococcus aureus)   . Anxiety   . Depression   . Vomiting   . Poor appetite     Medications:  Scheduled:     . ampicillin-sulbactam (UNASYN) IV  3 g Intravenous Q6H  . aspirin  81 mg Oral Daily  . azaTHIOprine  50 mg Oral Daily  . Chlorhexidine Gluconate Cloth  6 each Topical Q0600  . clopidogrel  75 mg Oral Q breakfast  . DULoxetine  30 mg Oral Daily  . enoxaparin (LOVENOX) injection  40 mg Subcutaneous QHS  . LORazepam  1 mg Oral Q8H  . montelukast  10 mg Oral QHS  . mupirocin ointment  1 application Nasal BID  . nystatin  5 mL Oral QID  . olopatadine  1 drop Both Eyes Daily  . pantoprazole  40 mg Oral Daily  . predniSONE  5 mg Oral Q breakfast  . sodium chloride  1,000 mL Intravenous Once  . vancomycin  1,000 mg Intravenous Q12H  . DISCONTD: cefTRIAXone (ROCEPHIN)  IV  2 g Intravenous Q24H   Infusions:     . 0.9 % NaCl with KCl 20 mEq / L 75 mL/hr at 06/14/12 1655   Assessment:  56 year old female with complaint of fever, aches, nausea, cough.  H/O gastric bypass, kidney transplant (on prednisone, azathioprine). Patient with recent spider bite on arm.  Ceftriaxone from 10/27 - 10/29  D4 IV Vancomycin and Unasyn for broad spectrum coverage due to immunosuppression for suspected upper respiratory tract infection and cellulitis of left upper arm  PCT ~ 66 (10/28)   10/27 blood x2: ngtd 10/27 urine: insignificant growth 10/27 MRSA PCR: positive 10/28 sputum: Nl Flora   Goal of Therapy:  Vancomycin trough level 15-20 mcg/ml  Plan:  1. Continue Vancomycin 1000mg  IV q12h 2. Continue Unasyn 3gm IV q6h 3. Vancomyin trough today prior to 1600 dose 4. Will follow up and adjust dose accoridingly 5. Follow up culture results   [place], Pharm.D. Clinical Oncology Pharmacist  Pager # [phone]  06/15/2012,8:20 AM

## 2012-06-15 NOTE — Progress Notes (Signed)
RT set pt up on CPAP. Set on autotitrate of min 5cm H2O, and max 20 cm H2O. Water filled to full line and 1L of O2 bled in.

## 2012-06-16 ENCOUNTER — Inpatient Hospital Stay (HOSPITAL_COMMUNITY): Payer: Medicare Other

## 2012-06-16 LAB — BASIC METABOLIC PANEL
BUN: 4 mg/dL — ABNORMAL LOW (ref 6–23)
GFR calc Af Amer: >90 mL/min (ref >90)
GFR calc non Af Amer: >90 mL/min (ref >90)
Potassium: 3 mEq/L — ABNORMAL LOW (ref 3.5–5.1)

## 2012-06-16 LAB — CBC
Hemoglobin: 10.2 g/dL — ABNORMAL LOW (ref 12.0–15.0)
MCHC: 32.6 g/dL (ref 30.0–36.0)
RDW: 15.2 % (ref 11.5–15.5)

## 2012-06-16 MED ORDER — BUPRENORPHINE 20 MCG/HR TD PTWK
20.0000 ug | MEDICATED_PATCH | TRANSDERMAL | Status: DC
  Administered 2012-06-16: 20 ug via TRANSDERMAL

## 2012-06-16 MED ORDER — HYDROCOD POLST-CHLORPHEN POLST 10-8 MG/5ML PO LQCR
5.0000 mL | Freq: Two times a day (BID) | ORAL | Status: DC | PRN
  Administered 2012-06-16 (×2): 5 mL via ORAL
  Filled 2012-06-16 (×2): qty 5

## 2012-06-16 MED ORDER — AMOXICILLIN-POT CLAVULANATE 875-125 MG PO TABS
1.0000 | ORAL_TABLET | Freq: Two times a day (BID) | ORAL | Status: DC
  Administered 2012-06-16 – 2012-06-17 (×3): 1 via ORAL
  Filled 2012-06-16 (×4): qty 1

## 2012-06-16 MED ORDER — POTASSIUM CHLORIDE CRYS ER 20 MEQ PO TBCR
40.0000 meq | EXTENDED_RELEASE_TABLET | Freq: Two times a day (BID) | ORAL | Status: AC
  Administered 2012-06-16 (×2): 40 meq via ORAL
  Filled 2012-06-16 (×2): qty 2

## 2012-06-16 MED ORDER — BUPRENORPHINE 10 MCG/HR TD PTWK: 10.0000 ug | MEDICATED_PATCH | TRANSDERMAL | Status: DC

## 2012-06-16 NOTE — Progress Notes (Signed)
[facility] PROGRESS NOTE  Katherine [name] [id number] DOB: 01-May-1956 DOA: 06/12/2012 PCP: Placido Sou, MD  Assessment/Plan: 1. Suspected pneumonia--improving. Weaned of oxygen, leukocytosis resolved. Change to oral antibiotics. Never started on atypical coverage. Given improvement will not add now. 2. Hypokalemia--replete. Check magnesium in AM. 3. Cellulitis [name]--resolved. 4. Hypotension/possible sepsis on admission--resolved. Possible related to relative adrenal insufficiency. Continue antibiotics. 5. S/p kidney transplant--stable, continue Imuran, prednisone. 6. S/p gastric bypass  Code Status: full code Family Communication: none present Disposition Plan: home 1-2 days  Murray Hodgkins, MD  [facility] Team 6 Pager [phone]. If 8PM-8AM, please contact night-coverage at [email or web address], password [facility] 06/16/2012, 5:24 PM  LOS: 4 days   Brief narrative: 56 year old woman PMH kidney transplant admitted for fever.  Consultants:  None  Procedures:  PICC  Antibiotics:  Vancomycin 10/27 >> 10/31  Unasyn 10/27 >> 10/31  Augmentin 10/31 >>  HPI/Subjective: Feels better, breathing better. Eating ok.  Objective: Filed Vitals:   06/16/12 0620 06/16/12 0850 06/16/12 1423 06/16/12 1553  BP: 140/82  119/70   Pulse: 73  70   Temp: 98.3 F (36.8 C)  98.5 F (36.9 C)   TempSrc: Oral  Oral   Resp: 16  16   Height:      Weight:      SpO2: 97% 92% 97% 97%    Intake/Output Summary (Last 24 hours) at 06/16/12 1724 Last data filed at 06/16/12 1300  Gross per 24 hour  Intake   1215 ml  Output   2100 ml  Net   -885 ml   Filed Weights   06/12/12 2300  Weight: 88.6 kg (195 lb 5.2 oz)    Exam:  General:  Appears calm and comfortable Cardiovascular: RRR, no m/r/g.  Respiratory: decreased air movement, no w/r/r. Normal respiratory effort. Psychiatric: grossly normal mood and affect, speech fluent and appropriate  Data Reviewed: Basic Metabolic  Panel:  Lab [id number] 0421 06/15/12 0401 06/14/12 0400 06/13/12 0922 06/13/12 0040 06/12/12 1609  NA 137 135 136 138 -- 136  K 3.0* 3.2* 3.6 4.2 -- 2.6*  CL 102 104 104 107 -- 99  CO2 23 21 22 20  -- 25  GLUCOSE 169* 116* 98 95 -- 146*  BUN 4* 9 11 14  -- 12  CREATININE 0.65 0.62 0.66 0.85 -- 0.90  CALCIUM 8.8 8.5 8.7 8.0* -- 9.0  MG -- -- 2.1 -- 1.1* --  PHOS -- -- -- -- -- --   Liver Function Tests:  Lab 06/12/12 1609  AST 32  ALT 20  ALKPHOS 123*  BILITOT 0.7  PROT 7.1  ALBUMIN 3.4*   CBC:  Lab 06/16/12 0421 06/15/12 0401 06/14/12 0400 06/13/12 0922 06/12/12 1609  WBC 4.5 7.5 13.2* 17.6* 11.5*  NEUTROABS -- -- -- -- 10.0*  HGB 10.2* 9.7* 11.0* 11.4* 12.1  HCT 31.3* 29.8* 33.6* 34.0* 37.6  MCV 85.1 86.1 84.6 84.2 85.6  PLT 191 162 180 157 184   Cardiac Enzymes:  Lab 06/12/12 1609  CKTOTAL --  CKMB --  CKMBINDEX --  TROPONINI <0.30    Recent Results (from the past 240 hour(s))  CULTURE, BLOOD (ROUTINE X 2)     Status: Normal (Preliminary result)   Collection Time   06/12/12  4:09 PM      Component Value Range Status Comment   Specimen Description BLOOD RIGHT HAND   Final    Special Requests BOTTLES DRAWN AEROBIC AND ANAEROBIC 5 CC EACH   Final    Culture  Setup  Time 06/12/2012 22:33   Final    Culture     Final    Value:        BLOOD CULTURE RECEIVED NO GROWTH TO DATE CULTURE WILL BE HELD FOR 5 DAYS BEFORE ISSUING A FINAL NEGATIVE REPORT   Report Status PENDING   Incomplete   CULTURE, BLOOD (ROUTINE X 2)     Status: Normal (Preliminary result)   Collection Time   06/12/12  4:22 PM      Component Value Range Status Comment   Specimen Description BLOOD RIGHT WRIST   Final    Special Requests BOTTLES DRAWN AEROBIC AND ANAEROBIC 5 CC EACH   Final    Culture  Setup Time 06/12/2012 22:33   Final    Culture     Final    Value:        BLOOD CULTURE RECEIVED NO GROWTH TO DATE CULTURE WILL BE HELD FOR 5 DAYS BEFORE ISSUING A FINAL NEGATIVE REPORT   Report Status  PENDING   Incomplete   URINE CULTURE     Status: Normal   Collection Time   06/12/12  6:46 PM      Component Value Range Status Comment   Specimen Description URINE, CLEAN CATCH   Final    Special Requests NONE   Final    Culture  Setup Time 06/12/2012 22:35   Final    Colony Count 9,000 COLONIES/ML   Final    Culture INSIGNIFICANT GROWTH   Final    Report Status 06/13/2012 FINAL   Final   MRSA PCR SCREENING     Status: Abnormal   Collection Time   06/12/12 11:12 PM      Component Value Range Status Comment   MRSA by PCR POSITIVE (*) NEGATIVE Final   CULTURE, EXPECTORATED SPUTUM-ASSESSMENT     Status: Normal   Collection Time   06/13/12 12:45 AM      Component Value Range Status Comment   Specimen Description SPUTUM   Final    Special Requests Immunocompromised   Final    Sputum evaluation     Final    Value: THIS SPECIMEN IS ACCEPTABLE. RESPIRATORY CULTURE REPORT TO FOLLOW.   Report Status 06/13/2012 FINAL   Final   CULTURE, RESPIRATORY     Status: Normal   Collection Time   06/13/12 12:45 AM      Component Value Range Status Comment   Specimen Description SPUTUM   Final    Special Requests NONE   Final    Gram Stain     Final    Value: MODERATE WBC PRESENT,BOTH PMN AND MONONUCLEAR     NO SQUAMOUS EPITHELIAL CELLS SEEN     NO ORGANISMS SEEN   Culture FEW CANDIDA ALBICANS   Final    Report Status 06/15/2012 FINAL   Final      Studies: Dg Chest 2 View  06/16/2012  *RADIOLOGY REPORT*  Clinical Data: Follow up pneumonia  CHEST - 2 VIEW  Comparison: 06/13/2012  Findings: Cardiomediastinal silhouette is stable.  Central mild vascular congestion and mild perihilar interstitial prominence.  No convincing pulmonary edema.  There is streaky right base medially atelectasis or infiltrate.  Stable linear atelectasis or scarring in the lingula. Status post median sternotomy. Gastric banding device is noted midline upper abdomen.  IMPRESSION: Central mild vascular congestion without  convincing pulmonary edema.  Status post median sternotomy.  Streaky right base medially atelectasis or infiltrate.  Follow-up to resolution is recommended.   Original Report  Authenticated By: Lahoma Crocker, M.D.     Scheduled Meds:    . amoxicillin-clavulanate  1 tablet Oral Q12H  . aspirin  81 mg Oral Daily  . azaTHIOprine  50 mg Oral Daily  . budesonide  0.5 mg Nebulization BID  . buprenorphine  20 mcg Transdermal Weekly  . Chlorhexidine Gluconate Cloth  6 each Topical Q0600  . clopidogrel  75 mg Oral Q breakfast  . DULoxetine  30 mg Oral Daily  . enoxaparin (LOVENOX) injection  40 mg Subcutaneous QHS  . levalbuterol  0.63 mg Nebulization BID  . LORazepam  1 mg Oral Q8H  . montelukast  10 mg Oral QHS  . mupirocin ointment  1 application Nasal BID  . nystatin  5 mL Oral QID  . olopatadine  1 drop Both Eyes Daily  . pantoprazole  40 mg Oral Daily  . potassium chloride  40 mEq Oral BID  . predniSONE  5 mg Oral Q breakfast  . sodium chloride  1,000 mL Intravenous Once  . DISCONTD: ampicillin-sulbactam (UNASYN) IV  3 g Intravenous Q6H  . DISCONTD: buprenorphine  10 mcg Transdermal Weekly  . DISCONTD: vancomycin  1,000 mg Intravenous Q12H   Continuous Infusions:    . sodium chloride 10 mL/hr (06/15/12 1930)    Principal Problem:  *PNA (pneumonia) Active Problems:  OBSTRUCTIVE SLEEP APNEA  Hypokalemia  Cellulitis  Renal transplant disorder  Hypomagnesemia     Murray Hodgkins, MD  [facility] Team 6 Pager [phone]. If 8PM-8AM, please contact night-coverage at [email or web address], password [facility] 06/16/2012, 5:24 PM  LOS: 4 days   Time spent: 20 minutes

## 2012-06-16 NOTE — Progress Notes (Signed)
Dr. Sarajane Jews notified re: patient's request for tussionex and also made aware that patient IV got infiltrated, MD okay'd, no IV ,he will siwtch antibiotic to oral. Sandie Ano RN.

## 2012-06-16 NOTE — Clinical Documentation Improvement (Signed)
POA DOCUMENTATION CLARIFICATION QUERY  THIS DOCUMENT IS NOT A PERMANENT PART OF THE MEDICAL RECORD  TO RESPOND TO THE THIS QUERY, FOLLOW THE INSTRUCTIONS BELOW:  1. If needed, update documentation for the patient's encounter via the notes activity.  2. Access this query again and click edit on the In [employer].  3. After updating, or not, click F2 to complete all highlighted (required) fields concerning your review. Select "additional documentation in the medical record" OR "no additional documentation provided".  4. Click Sign note button.  5. The deficiency will fall out of your In Basket *Please let us know if you are not able to complete this workflow by phone or e-mail (listed below).  Please update your documentation within the medical record to reflect your response to this query.                                                                                    06/16/12  Dear Dr. Murray Hodgkins /  Associates  In a better effort to capture your patient's severity of illness, reflect appropriate length of stay and utilization of resources, a review of the patient medical record has revealed the following indicators.    Based on your clinical judgment, please clarify and document in a progress note and/or discharge summary the clinical condition associated with the following supporting information:  In responding to this query please exercise your independent judgment.  The fact that a query is asked, does not imply that any particular answer is desired or expected.  Medicare rules require specification as to whether an inpatient diagnosis was present at the time of admission.  Please clarify if the following diagnosis,             PNA   was:       _ Present at the time of admission    _ NOT present at the time of inpatient admission and it developed during the inpatient stay    _ Unable to clinically determine whether the condition was present on admission.    _   Documentation insufficient to determine if condition was present at the time of inpatient admission   Supporting Documentation:    Per  10/29 progress notes fever, afebrile since admission,  -2/2 cellulitis and now evidence of PNA on CXR..  -Continue current antibiotics of vanc/unasyn Day 3.          You may use possible, probable, or suspect with inpatient documentation. possible, probable, suspected diagnoses MUST be documented at the time of discharge    Reviewed: additional documentation added      Thank You,  Serena Colonel RN, BSN, CCM   Clinical Documentation Specialist:  Pager [phone]  Health Information Management [place]

## 2012-06-17 LAB — BASIC METABOLIC PANEL
GFR calc Af Amer: >90 mL/min (ref >90)
GFR calc non Af Amer: >90 mL/min (ref >90)
Potassium: 3.1 mEq/L — ABNORMAL LOW (ref 3.5–5.1)
Sodium: 140 mEq/L (ref 135–145)

## 2012-06-17 MED ORDER — POTASSIUM CHLORIDE CRYS ER 20 MEQ PO TBCR
40.0000 meq | EXTENDED_RELEASE_TABLET | Freq: Two times a day (BID) | ORAL | Status: DC
  Administered 2012-06-17: 40 meq via ORAL
  Filled 2012-06-17 (×2): qty 2

## 2012-06-17 MED ORDER — AMOXICILLIN-POT CLAVULANATE 875-125 MG PO TABS: 1.0000 | ORAL_TABLET | Freq: Two times a day (BID) | ORAL | Status: DC

## 2012-06-17 NOTE — Progress Notes (Signed)
[facility] PROGRESS NOTE  Katherine [name] [id number] DOB: [birth year] DOA: 06/12/2012 PCP: Placido Sou, MD Pulmonologist: Baltazar Apo, MD  Assessment/Plan: 1. Suspected pneumonia--continues to improve. Weaned off oxygen, leukocytosis resolved. Continue oral antibiotics. Never started on atypical coverage. Given improvement this was not added. 2. Hypokalemia--replete. Magnesium normal. 3. Cellulitis [name]--resolved. 4. Hypotension/possible sepsis on admission--resolved. Possibly related to relative adrenal insufficiency. Continue antibiotics. 5. S/p kidney transplant--stable, continue Imuran, prednisone. 6. S/p gastric bypass  Code Status: full code Family Communication: none present Disposition Plan: home today  Murray Hodgkins, MD  [facility] Team 6 Pager [phone]. If 8PM-8AM, please contact night-coverage at [email or web address], password [facility] 06/17/2012, 11:10 AM  LOS: 5 days   Brief narrative: 56 year old woman PMH kidney transplant admitted for fever.  Consultants:  None  Procedures:  PICC  Antibiotics:  Vancomycin 10/27 >> 10/31  Unasyn 10/27 >> 10/31  Augmentin 10/31 >>11/5  HPI/Subjective: Poor appetite but eating ok. Breathing fine. Cough improved.  Objective: Filed Vitals:   06/16/12 2026 06/16/12 2045 06/17/12 0603 06/17/12 0846  BP:  135/76 133/77   Pulse:  89 90   Temp:  97.4 F (36.3 C) 98.7 F (37.1 C)   TempSrc:  Oral Oral   Resp:  16 18   Height:      Weight:      SpO2: 95% 100% 97% 90%    Intake/Output Summary (Last 24 hours) at 06/17/12 1110 Last data filed at 06/17/12 1016  Gross per 24 hour  Intake    720 ml  Output    550 ml  Net    170 ml   Filed Weights   06/12/12 2300  Weight: 88.6 kg (195 lb 5.2 oz)    Exam:  General:  Appears calm and comfortable Cardiovascular: RRR, no m/r/g.  Respiratory: improved air movement, few crackles right posterior. No wheezes or rhonchi. Normal respiratory  effort. Psychiatric: grossly normal mood and affect, speech fluent and appropriate  Data Reviewed: Basic Metabolic Panel:  Lab [id number] 06/16/12 0421 06/15/12 0401 06/14/12 0400 06/13/12 0922 06/13/12 0040  NA 140 137 135 136 138 --  K 3.1* 3.0* 3.2* 3.6 4.2 --  CL 103 102 104 104 107 --  CO2 26 23 21 22 20  --  GLUCOSE 80 169* 116* 98 95 --  BUN 3* 4* 9 11 14  --  CREATININE 0.65 0.65 0.62 0.66 0.85 --  CALCIUM 9.4 8.8 8.5 8.7 8.0* --  MG 1.7 -- -- 2.1 -- 1.1*  PHOS -- -- -- -- -- --   Liver Function Tests:  Lab 06/12/12 1609  AST 32  ALT 20  ALKPHOS 123*  BILITOT 0.7  PROT 7.1  ALBUMIN 3.4*   CBC:  Lab 06/16/12 0421 06/15/12 0401 06/14/12 0400 06/13/12 0922 06/12/12 1609  WBC 4.5 7.5 13.2* 17.6* 11.5*  NEUTROABS -- -- -- -- 10.0*  HGB 10.2* 9.7* 11.0* 11.4* 12.1  HCT 31.3* 29.8* 33.6* 34.0* 37.6  MCV 85.1 86.1 84.6 84.2 85.6  PLT 191 162 180 157 184   Cardiac Enzymes:  Lab 06/12/12 1609  CKTOTAL --  CKMB --  CKMBINDEX --  TROPONINI <0.30    Recent Results (from the past 240 hour(s))  CULTURE, BLOOD (ROUTINE X 2)     Status: Normal (Preliminary result)   Collection Time   06/12/12  4:09 PM      Component Value Range Status Comment   Specimen Description BLOOD RIGHT HAND   Final    Special Requests BOTTLES DRAWN AEROBIC  AND ANAEROBIC 5 CC EACH   Final    Culture  Setup Time 06/12/2012 22:33   Final    Culture     Final    Value:        BLOOD CULTURE RECEIVED NO GROWTH TO DATE CULTURE WILL BE HELD FOR 5 DAYS BEFORE ISSUING A FINAL NEGATIVE REPORT   Report Status PENDING   Incomplete   CULTURE, BLOOD (ROUTINE X 2)     Status: Normal (Preliminary result)   Collection Time   06/12/12  4:22 PM      Component Value Range Status Comment   Specimen Description BLOOD RIGHT WRIST   Final    Special Requests BOTTLES DRAWN AEROBIC AND ANAEROBIC 5 CC EACH   Final    Culture  Setup Time 06/12/2012 22:33   Final    Culture     Final    Value:        BLOOD CULTURE  RECEIVED NO GROWTH TO DATE CULTURE WILL BE HELD FOR 5 DAYS BEFORE ISSUING A FINAL NEGATIVE REPORT   Report Status PENDING   Incomplete   URINE CULTURE     Status: Normal   Collection Time   06/12/12  6:46 PM      Component Value Range Status Comment   Specimen Description URINE, CLEAN CATCH   Final    Special Requests NONE   Final    Culture  Setup Time 06/12/2012 22:35   Final    Colony Count 9,000 COLONIES/ML   Final    Culture INSIGNIFICANT GROWTH   Final    Report Status 06/13/2012 FINAL   Final   MRSA PCR SCREENING     Status: Abnormal   Collection Time   06/12/12 11:12 PM      Component Value Range Status Comment   MRSA by PCR POSITIVE (*) NEGATIVE Final   CULTURE, EXPECTORATED SPUTUM-ASSESSMENT     Status: Normal   Collection Time   06/13/12 12:45 AM      Component Value Range Status Comment   Specimen Description SPUTUM   Final    Special Requests Immunocompromised   Final    Sputum evaluation     Final    Value: THIS SPECIMEN IS ACCEPTABLE. RESPIRATORY CULTURE REPORT TO FOLLOW.   Report Status 06/13/2012 FINAL   Final   CULTURE, RESPIRATORY     Status: Normal   Collection Time   06/13/12 12:45 AM      Component Value Range Status Comment   Specimen Description SPUTUM   Final    Special Requests NONE   Final    Gram Stain     Final    Value: MODERATE WBC PRESENT,BOTH PMN AND MONONUCLEAR     NO SQUAMOUS EPITHELIAL CELLS SEEN     NO ORGANISMS SEEN   Culture FEW CANDIDA ALBICANS   Final    Report Status 06/15/2012 FINAL   Final      Studies: Dg Chest 2 View  06/16/2012  *RADIOLOGY REPORT*  Clinical Data: Follow up pneumonia  CHEST - 2 VIEW  Comparison: 06/13/2012  Findings: Cardiomediastinal silhouette is stable.  Central mild vascular congestion and mild perihilar interstitial prominence.  No convincing pulmonary edema.  There is streaky right base medially atelectasis or infiltrate.  Stable linear atelectasis or scarring in the lingula. Status post median sternotomy.  Gastric banding device is noted midline upper abdomen.  IMPRESSION: Central mild vascular congestion without convincing pulmonary edema.  Status post median sternotomy.  Streaky right base  medially atelectasis or infiltrate.  Follow-up to resolution is recommended.   Original Report Authenticated By: Lahoma Crocker, M.D.     Scheduled Meds:    . amoxicillin-clavulanate  1 tablet Oral Q12H  . aspirin  81 mg Oral Daily  . azaTHIOprine  50 mg Oral Daily  . budesonide  0.5 mg Nebulization BID  . buprenorphine  20 mcg Transdermal Weekly  . Chlorhexidine Gluconate Cloth  6 each Topical Q0600  . clopidogrel  75 mg Oral Q breakfast  . DULoxetine  30 mg Oral Daily  . enoxaparin (LOVENOX) injection  40 mg Subcutaneous QHS  . levalbuterol  0.63 mg Nebulization BID  . LORazepam  1 mg Oral Q8H  . montelukast  10 mg Oral QHS  . mupirocin ointment  1 application Nasal BID  . nystatin  5 mL Oral QID  . olopatadine  1 drop Both Eyes Daily  . pantoprazole  40 mg Oral Daily  . potassium chloride  40 mEq Oral BID  . potassium chloride  40 mEq Oral BID  . predniSONE  5 mg Oral Q breakfast  . sodium chloride  1,000 mL Intravenous Once  . DISCONTD: ampicillin-sulbactam (UNASYN) IV  3 g Intravenous Q6H  . DISCONTD: vancomycin  1,000 mg Intravenous Q12H   Continuous Infusions:    . sodium chloride 10 mL/hr (06/15/12 1930)    Principal Problem:  *PNA (pneumonia) Active Problems:  OBSTRUCTIVE SLEEP APNEA  Hypokalemia  Cellulitis  Renal transplant disorder  Hypomagnesemia     Murray Hodgkins, MD  [facility] Team 6 Pager [phone]. If 8PM-8AM, please contact night-coverage at [email or web address], password [facility] 06/17/2012, 11:10 AM  LOS: 5 days

## 2012-06-17 NOTE — Discharge Summary (Addendum)
Physician Discharge Summary  Katherine [name] [id number] DOB: 23-Aug-1955 DOA: 06/12/2012  PCP: Placido Sou, [name] Pulmonologist: Baltazar Apo, [name]  Admit date: 06/12/2012 Discharge date: 06/17/2012  Recommendations for Outpatient Follow-up:  1. Follow-up resolution of pneumonia  Follow-up Information    Follow up with DETERDING,Katherine [name]. Schedule an appointment as soon as possible for a visit in 1 week.   Contact information:   [place]  [id number] [phone]       Follow up with Katherine Gobble., [name]. Schedule an appointment as soon as possible for a visit in 2 weeks.   Contact information:   [address] [facility] [place] [address] [phone]         Discharge Diagnoses:  1. Suspected pneumonia, suspect present on admission 2. Hypokalemia 3. Cellulitis [name] 4. Hypotension/possible sepsis on admission 5. S/p kidney transplant 6. S/p gastric bypass  Discharge Condition: improved Disposition: home  Diet recommendation: regular  Filed Weights   06/12/12 2300  Weight: 88.6 kg (195 lb 5.2 oz)    History of present illness:  56 year old woman PMH kidney transplant admitted for fever.  Hospital Course:  [name] was admitted for further evaluation of fever and hypotension. She was place on empiric IV vancomycin and Unasyn for suspected cellulitis [name] and possible bronchitis. Subsequent imaging suggested pneumonia which correlated with history and lung exam. Hypotension, leukocytosis rapidly resolved and hypoxia has resolved. Blood cultures NGTD. Antibiotics were narrowed to monotherapy with Augmentin. Given rapid improvement on Unasyn & Vancomycin it was felt monotherapy with Augmentin was appropriate and atypical coverage was not added given improvement on empiric therapy. [name] cellulitis rapidly resolved. Co-morbidities remained stable.  1. Suspected pneumonia--continues to improve. Weaned off  oxygen, leukocytosis resolved. Continue oral antibiotics. Never started on atypical coverage. Given improvement this was not added.  2. Hypokalemia--repleted. Magnesium normal.  3. Cellulitis [name]--resolved.  4. Hypotension/possible sepsis on admission--resolved. Possibly related to relative adrenal insufficiency. Continue antibiotics.  5. S/p kidney transplant--stable, continue Imuran, prednisone.  6. S/p gastric bypass  Consultants:  None  Procedures:  Peripheral access only. No PICC was placed  Antibiotics:  Vancomycin 10/27 >> 10/31   Unasyn 10/27 >> 10/31   Augmentin 10/31 >>11/5  Discharge Instructions  Discharge Orders    Future Orders Please Complete By Expires   Diet general      Discharge instructions      Comments:   Be sure to finish antibiotic. Call physician or seek immediate medical attention for fever, shortness of breath or worsening of condition.   Activity as tolerated - No restrictions          Medication List     As of 06/17/2012 11:31 AM    STOP taking these medications         BUTRANS 10 MCG/HR Ptwk   Generic drug: buprenorphine      TAKE these medications         amoxicillin-clavulanate 875-125 MG per tablet   Commonly known as: AUGMENTIN   Take 1 tablet by mouth every 12 (twelve) hours. First dose this evening 11/1.      aspirin EC 81 MG tablet   Take 81 mg by mouth daily.      azaTHIOprine 50 MG tablet   Commonly known as: IMURAN   Take 125 mg by mouth daily.      budesonide 0.5 MG/2ML nebulizer solution   Commonly known as: PULMICORT   Take 0.5 mg by nebulization  2 (two) times daily.      BUTRANS 20 MCG/HR Ptwk   Generic drug: buprenorphine   Place 20 mcg onto the skin every 3 (three) days.      calcitRIOL 0.25 MCG capsule   Commonly known as: ROCALTROL   Take 0.25 mcg by mouth daily.      CITRACAL + D PO   Take by mouth daily.      clopidogrel 75 MG tablet   Commonly known as: PLAVIX   Take 75 mg by mouth daily.        clotrimazole 10 MG troche   Commonly known as: MYCELEX   Take as directed      cyclobenzaprine 10 MG tablet   Commonly known as: FLEXERIL   Take 10 mg by mouth 3 (three) times daily.      DULoxetine 30 MG capsule   Commonly known as: CYMBALTA   Take 30 mg by mouth daily.      fluticasone 50 MCG/ACT nasal spray   Commonly known as: FLONASE   INHALE 2 SPRAYS IN EACH NOSTRIL DAILY      levalbuterol 0.63 MG/3ML nebulizer solution   Commonly known as: XOPENEX   Take 1 ampule by nebulization every 4 (four) hours as needed. wheezing and shortness of breath      LORazepam 1 MG tablet   Commonly known as: ATIVAN   Take 1 mg by mouth every 8 (eight) hours.      montelukast 10 MG tablet   Commonly known as: SINGULAIR   TAKE 1 TABLET ONCE A DAY      omeprazole 40 MG capsule   Commonly known as: PRILOSEC   Take 40 mg by mouth daily.      PATANOL 0.1 % ophthalmic solution   Generic drug: olopatadine   Place 1 drop into both eyes daily.      predniSONE 5 MG tablet   Commonly known as: DELTASONE   Take 5 mg by mouth daily.      promethazine 25 MG tablet   Commonly known as: PHENERGAN   Take 25 mg by mouth every 6 (six) hours as needed. For nausea and sleep      rosuvastatin 20 MG tablet   Commonly known as: CRESTOR   Take 20 mg by mouth daily.      sertraline 100 MG tablet   Commonly known as: ZOLOFT   Take 200 mg by mouth at bedtime.      traMADol 50 MG tablet   Commonly known as: ULTRAM   Take 50-100 mg by mouth every 8 (eight) hours as needed. For pain        The results of significant diagnostics from this hospitalization (including imaging, microbiology, ancillary and laboratory) are listed below for reference.    Significant Diagnostic Studies: Dg Chest 1 View  06/13/2012  *RADIOLOGY REPORT*  Clinical Data: Spider bite.  Evaluate for pneumonia.  CHEST - 1 VIEW  Comparison: 06/12/2012 and CT chest 09/16/2010.  Findings: Trachea is midline.  Heart size stable.   There is added density in the right infrahilar region.  Lungs are low in volume but otherwise clear.  No pleural fluid.  IMPRESSION: Added density in the right infrahilar region may be due to slight rotation and portable semi upright technique.  Reevaluation with PA and lateral views, when the patient is able, is recommended.   Original Report Authenticated By: Luretha Rued, M.D.    Dg Chest 2 View  06/16/2012  *RADIOLOGY REPORT*  Clinical Data: Follow up pneumonia  CHEST - 2 VIEW  Comparison: 06/13/2012  Findings: Cardiomediastinal silhouette is stable.  Central mild vascular congestion and mild perihilar interstitial prominence.  No convincing pulmonary edema.  There is streaky right base medially atelectasis or infiltrate.  Stable linear atelectasis or scarring in the lingula. Status post median sternotomy. Gastric banding device is noted midline upper abdomen.  IMPRESSION: Central mild vascular congestion without convincing pulmonary edema.  Status post median sternotomy.  Streaky right base medially atelectasis or infiltrate.  Follow-up to resolution is recommended.   Original Report Authenticated By: Lahoma Crocker, M.D.    Dg Chest 2 View  06/12/2012  *RADIOLOGY REPORT*  Clinical Data: Nausea with vomiting and fever.  History of myocardial infarction and CABG.  CHEST - 2 VIEW  Comparison: 12/23/2011.  Findings: The heart size and mediastinal contours are stable status post CABG.  Several of the sternotomy wires are chronically fractured.  There are lower lung volumes with new patchy perihilar opacities bilaterally.  No edema or confluent airspace opacity is present.  There is no pleural effusion.  IMPRESSION: Low lung volumes with increased probable perihilar atelectasis.  No consolidation or definite edema.   Original Report Authenticated By: Vivia Ewing, M.D.    Dg Abd 2 Views  06/12/2012  *RADIOLOGY REPORT*  Clinical Data: Abdominal pain  ABDOMEN - 2 VIEW  Comparison: CT of the abdomen  and pelvis with contrast 06/04/2008.  Findings: The lung bases are clear.  A gastric band is again noted within satisfactory position.  The bowel gas pattern is unremarkable.  There is no evidence for obstruction or free air. The axial skeleton is unremarkable.  IMPRESSION:  1.  Status post gastric banding.  The device is in a satisfactory position. 2.  No acute abnormality.   Original Report Authenticated By: Resa Miner. MATTERN, M.D.    Microbiology: Recent Results (from the past 240 hour(s))  CULTURE, BLOOD (ROUTINE X 2)     Status: Normal (Preliminary result)   Collection Time   06/12/12  4:09 PM      Component Value Range Status Comment   Specimen Description BLOOD RIGHT HAND   Final    Special Requests BOTTLES DRAWN AEROBIC AND ANAEROBIC 5 CC EACH   Final    Culture  Setup Time 06/12/2012 22:33   Final    Culture     Final    Value:        BLOOD CULTURE RECEIVED NO GROWTH TO DATE CULTURE WILL BE HELD FOR 5 DAYS BEFORE ISSUING A FINAL NEGATIVE REPORT   Report Status PENDING   Incomplete   CULTURE, BLOOD (ROUTINE X 2)     Status: Normal (Preliminary result)   Collection Time   06/12/12  4:22 PM      Component Value Range Status Comment   Specimen Description BLOOD RIGHT WRIST   Final    Special Requests BOTTLES DRAWN AEROBIC AND ANAEROBIC 5 CC EACH   Final    Culture  Setup Time 06/12/2012 22:33   Final    Culture     Final    Value:        BLOOD CULTURE RECEIVED NO GROWTH TO DATE CULTURE WILL BE HELD FOR 5 DAYS BEFORE ISSUING A FINAL NEGATIVE REPORT   Report Status PENDING   Incomplete   URINE CULTURE     Status: Normal   Collection Time   06/12/12  6:46 PM      Component Value Range Status Comment  Specimen Description URINE, CLEAN CATCH   Final    Special Requests NONE   Final    Culture  Setup Time 06/12/2012 22:35   Final    Colony Count 9,000 COLONIES/ML   Final    Culture INSIGNIFICANT GROWTH   Final    Report Status 06/13/2012 FINAL   Final   MRSA PCR SCREENING      Status: Abnormal   Collection Time   06/12/12 11:12 PM      Component Value Range Status Comment   MRSA by PCR POSITIVE (*) NEGATIVE Final   CULTURE, EXPECTORATED SPUTUM-ASSESSMENT     Status: Normal   Collection Time   06/13/12 12:45 AM      Component Value Range Status Comment   Specimen Description SPUTUM   Final    Special Requests Immunocompromised   Final    Sputum evaluation     Final    Value: THIS SPECIMEN IS ACCEPTABLE. RESPIRATORY CULTURE REPORT TO FOLLOW.   Report Status 06/13/2012 FINAL   Final   CULTURE, RESPIRATORY     Status: Normal   Collection Time   06/13/12 12:45 AM      Component Value Range Status Comment   Specimen Description SPUTUM   Final    Special Requests NONE   Final    Gram Stain     Final    Value: MODERATE WBC PRESENT,BOTH PMN AND MONONUCLEAR     NO SQUAMOUS EPITHELIAL CELLS SEEN     NO ORGANISMS SEEN   Culture FEW CANDIDA ALBICANS   Final    Report Status 06/15/2012 FINAL   Final      Labs: Basic Metabolic Panel:  Lab [id number] 06/16/12 0421 06/15/12 0401 06/14/12 0400 06/13/12 0922 06/13/12 0040  NA 140 137 135 136 138 --  K 3.1* 3.0* 3.2* 3.6 4.2 --  CL 103 102 104 104 107 --  CO2 26 23 21 22 20  --  GLUCOSE 80 169* 116* 98 95 --  BUN 3* 4* 9 11 14  --  CREATININE 0.65 0.65 0.62 0.66 0.85 --  CALCIUM 9.4 8.8 8.5 8.7 8.0* --  MG 1.7 -- -- 2.1 -- 1.1*  PHOS -- -- -- -- -- --   Liver Function Tests:  Lab 06/12/12 1609  AST 32  ALT 20  ALKPHOS 123*  BILITOT 0.7  PROT 7.1  ALBUMIN 3.4*   CBC:  Lab 06/16/12 0421 06/15/12 0401 06/14/12 0400 06/13/12 0922 06/12/12 1609  WBC 4.5 7.5 13.2* 17.6* 11.5*  NEUTROABS -- -- -- -- 10.0*  HGB 10.2* 9.7* 11.0* 11.4* 12.1  HCT 31.3* 29.8* 33.6* 34.0* 37.6  MCV 85.1 86.1 84.6 84.2 85.6  PLT 191 162 180 157 184   Cardiac Enzymes:  Lab 06/12/12 1609  CKTOTAL --  CKMB --  CKMBINDEX --  TROPONINI <0.30    Principal Problem:  *PNA (pneumonia) Active Problems:  OBSTRUCTIVE SLEEP  APNEA  Hypokalemia  Cellulitis  Renal transplant disorder  Hypomagnesemia   Time coordinating discharge: 25 minutes  Signed:  Murray Hodgkins, [name] [facility] 06/17/2012, 11:31 AM

## 2012-06-18 ENCOUNTER — Other Ambulatory Visit: Payer: Self-pay | Admitting: Emergency Medicine

## 2012-06-18 LAB — CULTURE, BLOOD (ROUTINE X 2): Culture: NO GROWTH

## 2012-06-22 NOTE — Progress Notes (Signed)
I did not see patient nor was aware of incident but on reviewing note I agree with [facility] assessment and treatment.

## 2012-07-01 ENCOUNTER — Ambulatory Visit (INDEPENDENT_AMBULATORY_CARE_PROVIDER_SITE_OTHER)
Admission: RE | Admit: 2012-07-01 | Discharge: 2012-07-01 | Disposition: A | Discharge status: Home / Self Care | Payer: Medicare Other | Source: Ambulatory Visit | Attending: Adult Health | Admitting: Adult Health

## 2012-07-01 ENCOUNTER — Ambulatory Visit (INDEPENDENT_AMBULATORY_CARE_PROVIDER_SITE_OTHER): Payer: Medicare Other | Admitting: Adult Health

## 2012-07-01 ENCOUNTER — Encounter: Payer: Self-pay | Admitting: Adult Health

## 2012-07-01 VITALS — BP 124/82 | HR 75 | Temp 97.2°F | Ht 61.0 in | Wt 190.8 lb

## 2012-07-01 DIAGNOSIS — J189 Pneumonia, unspecified organism: Secondary | ICD-10-CM

## 2012-07-01 DIAGNOSIS — R05 Cough: Secondary | ICD-10-CM | POA: Diagnosis not present

## 2012-07-01 DIAGNOSIS — J209 Acute bronchitis, unspecified: Secondary | ICD-10-CM | POA: Diagnosis not present

## 2012-07-01 MED ORDER — LEVOFLOXACIN 500 MG PO TABS: 500.0000 mg | ORAL_TABLET | Freq: Every day | ORAL | Status: DC

## 2012-07-01 NOTE — Progress Notes (Signed)
56 year old female patient has a known history of obesity, status post lap band surgery with significant weight loss. Patient also has a history of renal transplant on chronic immunosuppression with Imuran and prednisone. The patient has a history of sleep apnea now much improved after significant weight loss.  Still needs repeat PSG to prove that OSA has resolved. She has stopped using CPAP.   May 15, 2009 --Present for an acute office visit. Complains of coughing and chest tightness/congestion x 4wks. The cough has started a month ago and had one episode of bloody-tinged phlegm. Didn't take any OTC to help the cough. Has had some episode of vomitted after eating , this is chronic since lap band. Denies chest pain, abd. pain, melena. orthopnea, hemoptysis, fever, n/v/d, edema, headache   ROV 12/18/09 -- ROV to follow OSA/OHS, hx lap band sgy and GERD/reflux. Tells me that she was exposed to cleaning solution, bleach in the last week. Having some chest tightness, dry cough. Doing sinus rinses + nasonex. Taking omeprazole.   ROV 01/23/10 -- Acute visit. Has had fever, aches, head congestion, tight chest. Has had some dry cough, some nausea. Having trouble getting a deep breath.   July 30, 2010 --Presents for an acute office viist. Complains of sinus pressure/congestion, PND and sore throat, right ear discomfort, prod cough with yellow mucus, increased SOB, wheezing x 2 weeks . Has been doing well until last 2 weeks. Does not want to be sick for the holidays, family is coming in to visit.Denies chest pain, orthopnea, hemoptysis, fever, n/v/d, edema. Has alot of sinus congestion and pressure.   ROV 04/29/11 -- OSA/OHS, hx lap band sgy and GERD/reflux, often assoc w cough. She was hospitalized in Jan - Feb '12 for aspiration of gastric contents. She had to have the lap-band loosened because it was contributing to the aspiration. She gained 40 lbs and hasn't lost it. She presents today c/o sinus  congestion, HA beginning 1 month ago, some drainage to back of throat. Has been on her flonase and singulair. Doesn't use pulmicort anymore. No purulent mucous, no fever.  >>saline nasal rinses   05/06/11  Acute OV  Patient is no better since last visit with Dr. Lamonte Sakai. Patient c/o right ear pain, sore throat is worse since last visit.  Also feels fatigued. Throat is very sore . Singulair , flonase and saline rinses are not helping.  OTC is not helping.  >>Omnicef rx   Acute OV 12/23/11 --  Patient c/o ear pain in both ears, sore throat, chest tightness, chest pain mostly in her back, cough with thick stringy light yellow mucus, and wheezing that is mostly at night x 2 weeks. Just finished Doxy for boil.  CXR today shows  No acute process.  No fever or hemoptysis   ROV 02/05/12 -- OSA/OHS, hx lap band sgy and GERD/reflux, often assoc w cough. Treated for persistent cough and drainage as above. She is still experiencing some drainage, tolerating sinus washes and flonase. Still has frequent reflux/emesis - almost certainly causing UA irritation. [place] follows her lap-band.  Took diflucan for thrush 2 weeks ago.  She is having pain on inspiration, especially when she lays down supine.   05/11/2012 Acute OV  Complains of 1 week follow up URI - finished levaquin 500mg .  reports prod cough, wheezing and tightness are resolved.   Feeling much better  Cough and wheezing have resolved.  No hemoptysis or chest pain.  No edema.  >>no changes   07/01/2012 [facility]  follow up patient presents for a post hospital followup. Patient was admitted October 27 through 06/17/2012 for suspected pneumonia, hypotension, with possible sepsis syndrome On admission. Patient was noted to have fever and hypotension. She was placed on empiric IV antibiotics Her condition was also complicated by an associated cellulitis of the left upper extremity She responded well to therapy and was transitioned over to monotherapy  with Augmentin.  Since discharge. Patient continues to feel weak and has cough, congestion. Still has green nasal discharge. Finished Augmentin ~1 week ago.  Chest x-ray today shows improved aeration  No fever, hemoptysis , edema.     ROS;  .Constitutional:   No  weight loss, night sweats,  Fevers, chills,  +fatigue, or  lassitude.  HEENT:   No headaches,  Difficulty swallowing,  Tooth/dental problems, or  Sore throat,                No sneezing, itching, ear ache, + nasal congestion, post nasal drip,   CV:  No chest pain,  Orthopnea, PND, swelling in lower extremities, anasarca, dizziness, palpitations, syncope.   GI  No heartburn, indigestion, abdominal pain,  , diarrhea, change in bowel habits, loss of appetite, bloody stools.   Resp: .  No change in color of mucus.  .  No chest wall deformity  Skin: no rash or lesions.  GU: no dysuria, change in color of urine, no urgency or frequency.  No flank pain, no hematuria   MS:  No joint pain or swelling.  No decreased range of motion.    Psych:  No change in mood or affect. No depression or anxiety.  No memory loss.     PE :   Gen: Pleasant, obese, has gained wt, in no distress,  normal affect  ENT post pharynx erythema, no purulence  Neck: No JVD, no TMG, no carotid bruits  Lungs: No use of accessory muscles, no dullness to percussion, few scattered rhonchi without rales or rhonchi  Cardiovascular: RRR, heart sounds normal, no murmur or gallops, no peripheral edema  Musculoskeletal: No deformities, no cyanosis or clubbing  Neuro: alert, non focal  Skin: Warm, no lesions or rashes

## 2012-07-01 NOTE — Patient Instructions (Addendum)
Levaquin 500mg  daily for 10 days  Tylenol As needed   Saline nasal rinses As needed   Mucinex DM Twice daily  As needed  Cough/congestion  Please contact office for sooner follow up if symptoms do not improve or worsen or seek emergency care   Follow up Dr. Lamonte Sakai  In 4 weeks and As needed

## 2012-07-01 NOTE — Assessment & Plan Note (Signed)
Slow to resolve flare w/ sinusitis  Recent ?PNA -resolved on CXR   Plan Levaquin 500mg  daily for 10 days  Tylenol As needed   Saline nasal rinses As needed   Mucinex DM Twice daily  As needed  Cough/congestion  Please contact office for sooner follow up if symptoms do not improve or worsen or seek emergency care   Follow up Dr. Lamonte Sakai  In 4 weeks and As needed

## 2012-07-05 NOTE — Progress Notes (Signed)
Quick Note:  Called spoke with patient, advised of cxr results / recs as stated by TP. Pt verbalized her understanding and denied any questions. ______ 

## 2012-07-26 ENCOUNTER — Other Ambulatory Visit: Payer: Self-pay | Admitting: Emergency Medicine

## 2012-07-28 ENCOUNTER — Telehealth: Payer: Self-pay | Admitting: Emergency Medicine

## 2012-07-28 NOTE — Telephone Encounter (Signed)
I spoke with pt and she stated she has begun to cough again and is little congested. i offered pt an OV today but stated she can't come in until tomorrow. i scheduled pt OV with MW at 9 AM 07/29/12. Nothing further was needed

## 2012-07-29 ENCOUNTER — Ambulatory Visit: Payer: Medicare Other | Admitting: Internal Medicine

## 2012-08-12 ENCOUNTER — Ambulatory Visit: Payer: Medicare Other | Admitting: Emergency Medicine

## 2012-08-25 DIAGNOSIS — E119 Type 2 diabetes mellitus without complications: Secondary | ICD-10-CM | POA: Diagnosis not present

## 2012-08-25 DIAGNOSIS — E669 Obesity, unspecified: Secondary | ICD-10-CM | POA: Diagnosis not present

## 2012-08-25 DIAGNOSIS — Z940 Kidney transplant status: Secondary | ICD-10-CM | POA: Diagnosis not present

## 2012-08-25 DIAGNOSIS — E785 Hyperlipidemia, unspecified: Secondary | ICD-10-CM | POA: Diagnosis not present

## 2012-08-31 DIAGNOSIS — M25519 Pain in unspecified shoulder: Secondary | ICD-10-CM | POA: Diagnosis not present

## 2012-08-31 DIAGNOSIS — M75100 Unspecified rotator cuff tear or rupture of unspecified shoulder, not specified as traumatic: Secondary | ICD-10-CM | POA: Diagnosis not present

## 2012-08-31 DIAGNOSIS — M19049 Primary osteoarthritis, unspecified hand: Secondary | ICD-10-CM | POA: Diagnosis not present

## 2012-08-31 DIAGNOSIS — IMO0001 Reserved for inherently not codable concepts without codable children: Secondary | ICD-10-CM | POA: Diagnosis not present

## 2012-08-31 DIAGNOSIS — M76899 Other specified enthesopathies of unspecified lower limb, excluding foot: Secondary | ICD-10-CM | POA: Diagnosis not present

## 2012-08-31 DIAGNOSIS — M25559 Pain in unspecified hip: Secondary | ICD-10-CM | POA: Diagnosis not present

## 2012-09-01 ENCOUNTER — Ambulatory Visit: Payer: Medicare Other | Admitting: Emergency Medicine

## 2012-09-17 ENCOUNTER — Telehealth (INDEPENDENT_AMBULATORY_CARE_PROVIDER_SITE_OTHER): Payer: Self-pay | Admitting: General Surgery

## 2012-09-17 ENCOUNTER — Other Ambulatory Visit (INDEPENDENT_AMBULATORY_CARE_PROVIDER_SITE_OTHER): Payer: Self-pay | Admitting: General Surgery

## 2012-09-17 MED ORDER — DOXYCYCLINE HYCLATE 100 MG PO TABS: 100.0000 mg | ORAL_TABLET | Freq: Two times a day (BID) | ORAL | Status: DC

## 2012-09-17 NOTE — Telephone Encounter (Signed)
She is a patient of Dr. Lear Ng.  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 She feels when coming up and the buttock/anal area. She states that he has called her in doxycycline in the past and this has worked. She takes Plavix for her condition. I told her to stop her Plavix and I will call her in some doxycycline. If the situation gets worse then she needs to call back and may need an incision and drainage. If the situation gets better in the next couple days, she can restart her Plavix.

## 2012-10-08 ENCOUNTER — Encounter: Payer: Self-pay | Admitting: *Deleted

## 2012-10-08 DIAGNOSIS — I6529 Occlusion and stenosis of unspecified carotid artery: Secondary | ICD-10-CM

## 2012-10-08 DIAGNOSIS — E785 Hyperlipidemia, unspecified: Secondary | ICD-10-CM

## 2012-10-08 DIAGNOSIS — I2510 Atherosclerotic heart disease of native coronary artery without angina pectoris: Secondary | ICD-10-CM

## 2012-10-08 DIAGNOSIS — I2119 ST elevation (STEMI) myocardial infarction involving other coronary artery of inferior wall: Secondary | ICD-10-CM

## 2012-10-08 DIAGNOSIS — I059 Rheumatic mitral valve disease, unspecified: Secondary | ICD-10-CM

## 2012-10-08 DIAGNOSIS — I25810 Atherosclerosis of coronary artery bypass graft(s) without angina pectoris: Secondary | ICD-10-CM | POA: Insufficient documentation

## 2012-10-08 DIAGNOSIS — I6521 Occlusion and stenosis of right carotid artery: Secondary | ICD-10-CM | POA: Insufficient documentation

## 2012-10-08 DIAGNOSIS — Z951 Presence of aortocoronary bypass graft: Secondary | ICD-10-CM

## 2012-10-14 ENCOUNTER — Encounter (INDEPENDENT_AMBULATORY_CARE_PROVIDER_SITE_OTHER): Payer: Self-pay

## 2012-11-02 ENCOUNTER — Encounter (INDEPENDENT_AMBULATORY_CARE_PROVIDER_SITE_OTHER): Payer: Self-pay

## 2012-11-11 ENCOUNTER — Telehealth (INDEPENDENT_AMBULATORY_CARE_PROVIDER_SITE_OTHER): Payer: Self-pay | Admitting: *Deleted

## 2012-11-11 DIAGNOSIS — N390 Urinary tract infection, site not specified: Secondary | ICD-10-CM | POA: Diagnosis not present

## 2012-11-11 NOTE — Telephone Encounter (Signed)
Placed in error

## 2012-11-20 ENCOUNTER — Other Ambulatory Visit: Payer: Self-pay | Admitting: Emergency Medicine

## 2012-11-23 DIAGNOSIS — N390 Urinary tract infection, site not specified: Secondary | ICD-10-CM | POA: Diagnosis not present

## 2012-11-23 DIAGNOSIS — Z940 Kidney transplant status: Secondary | ICD-10-CM | POA: Diagnosis not present

## 2012-11-23 DIAGNOSIS — E785 Hyperlipidemia, unspecified: Secondary | ICD-10-CM | POA: Diagnosis not present

## 2012-11-23 DIAGNOSIS — E119 Type 2 diabetes mellitus without complications: Secondary | ICD-10-CM | POA: Diagnosis not present

## 2012-11-23 DIAGNOSIS — E669 Obesity, unspecified: Secondary | ICD-10-CM | POA: Diagnosis not present

## 2012-11-23 DIAGNOSIS — I10 Essential (primary) hypertension: Secondary | ICD-10-CM | POA: Diagnosis not present

## 2012-11-23 DIAGNOSIS — D649 Anemia, unspecified: Secondary | ICD-10-CM | POA: Diagnosis not present

## 2012-11-23 DIAGNOSIS — R5381 Other malaise: Secondary | ICD-10-CM | POA: Diagnosis not present

## 2012-11-23 DIAGNOSIS — R5383 Other fatigue: Secondary | ICD-10-CM | POA: Diagnosis not present

## 2012-11-28 ENCOUNTER — Other Ambulatory Visit: Payer: Self-pay | Admitting: Emergency Medicine

## 2012-11-28 MED ORDER — FLUTICASONE PROPIONATE 50 MCG/ACT NA SUSP: 2.0000 | Freq: Every day | NASAL | Status: DC

## 2012-11-28 NOTE — Telephone Encounter (Signed)
Rx sent 

## 2012-12-19 DIAGNOSIS — M19049 Primary osteoarthritis, unspecified hand: Secondary | ICD-10-CM | POA: Diagnosis not present

## 2012-12-19 DIAGNOSIS — M76899 Other specified enthesopathies of unspecified lower limb, excluding foot: Secondary | ICD-10-CM | POA: Diagnosis not present

## 2012-12-19 DIAGNOSIS — IMO0001 Reserved for inherently not codable concepts without codable children: Secondary | ICD-10-CM | POA: Diagnosis not present

## 2012-12-19 DIAGNOSIS — M25559 Pain in unspecified hip: Secondary | ICD-10-CM | POA: Diagnosis not present

## 2012-12-21 ENCOUNTER — Encounter (INDEPENDENT_AMBULATORY_CARE_PROVIDER_SITE_OTHER): Payer: Medicare Other | Admitting: General Surgery

## 2013-01-05 IMAGING — CR DG CHEST 2V
2 series · 2 of 2 positions shown · non-contrast
Comparison: 09/15/2010

CLINICAL DATA: Cough, shortness of breath

CHEST - 2 VIEW

[view not recorded (1 of 2)]
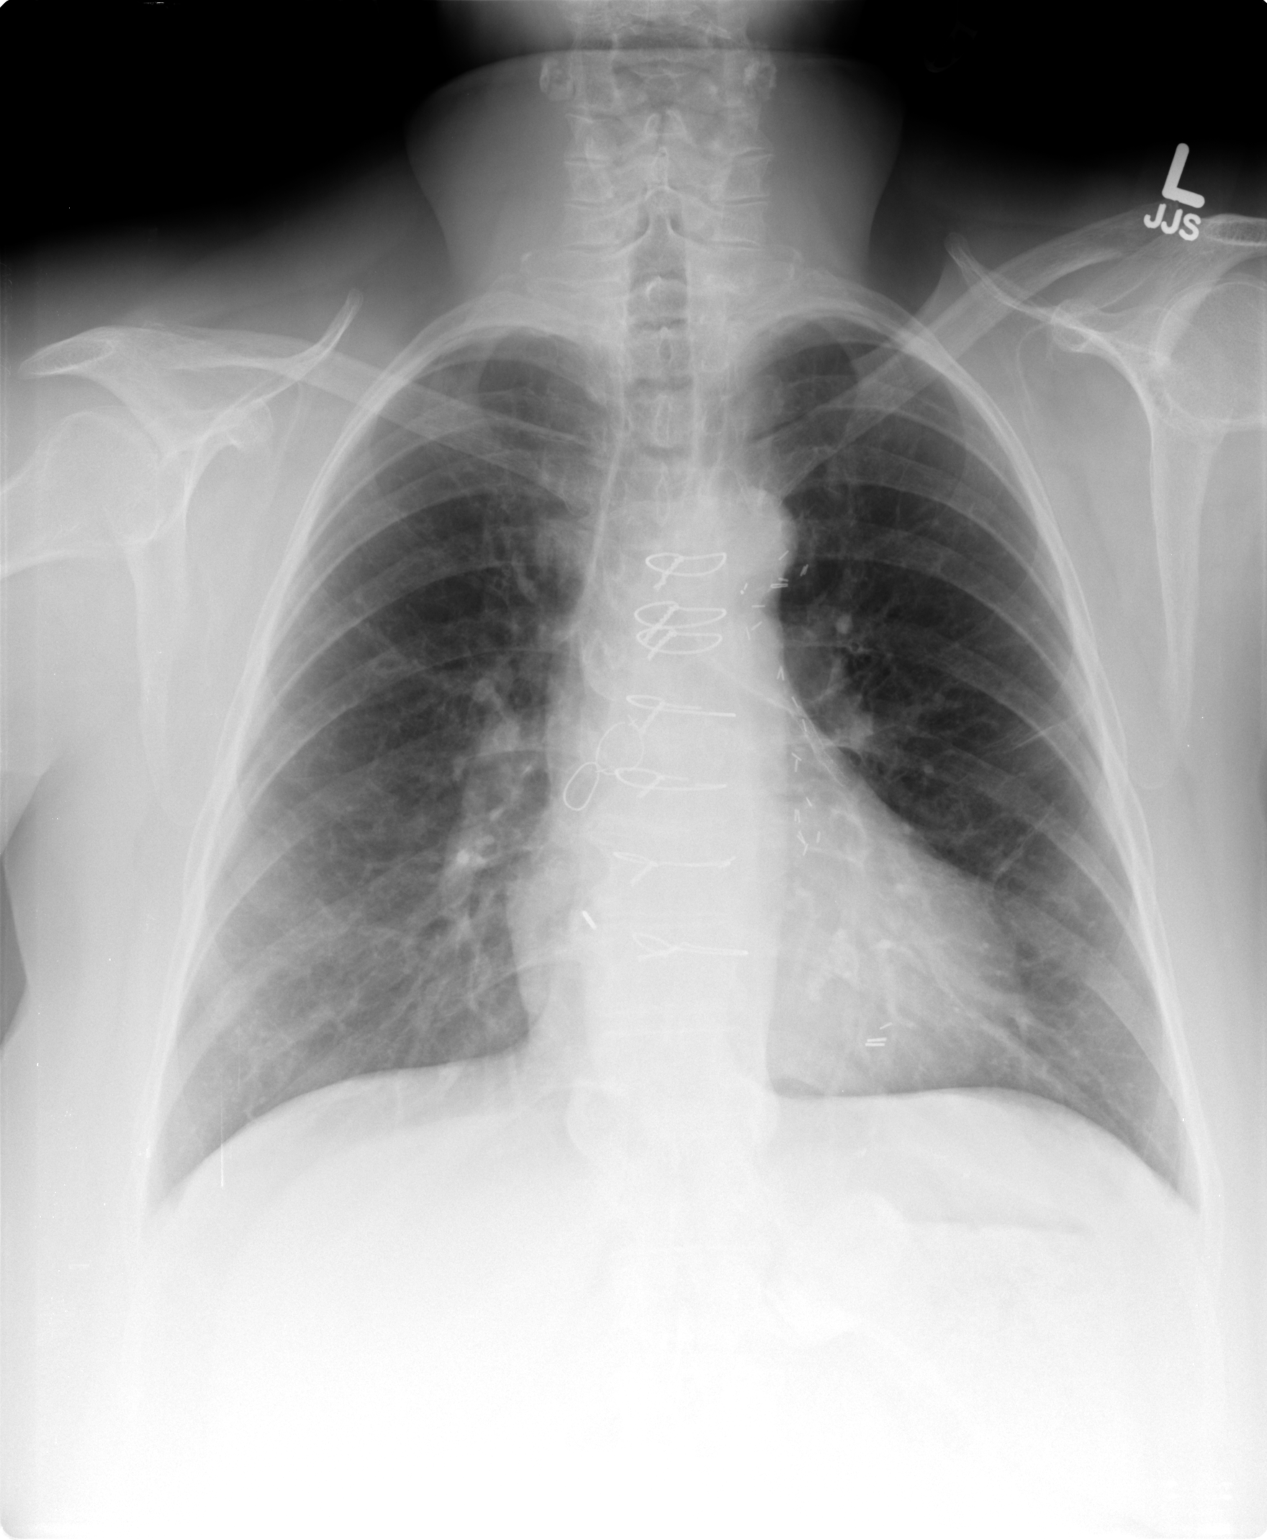

[view not recorded (2 of 2)]
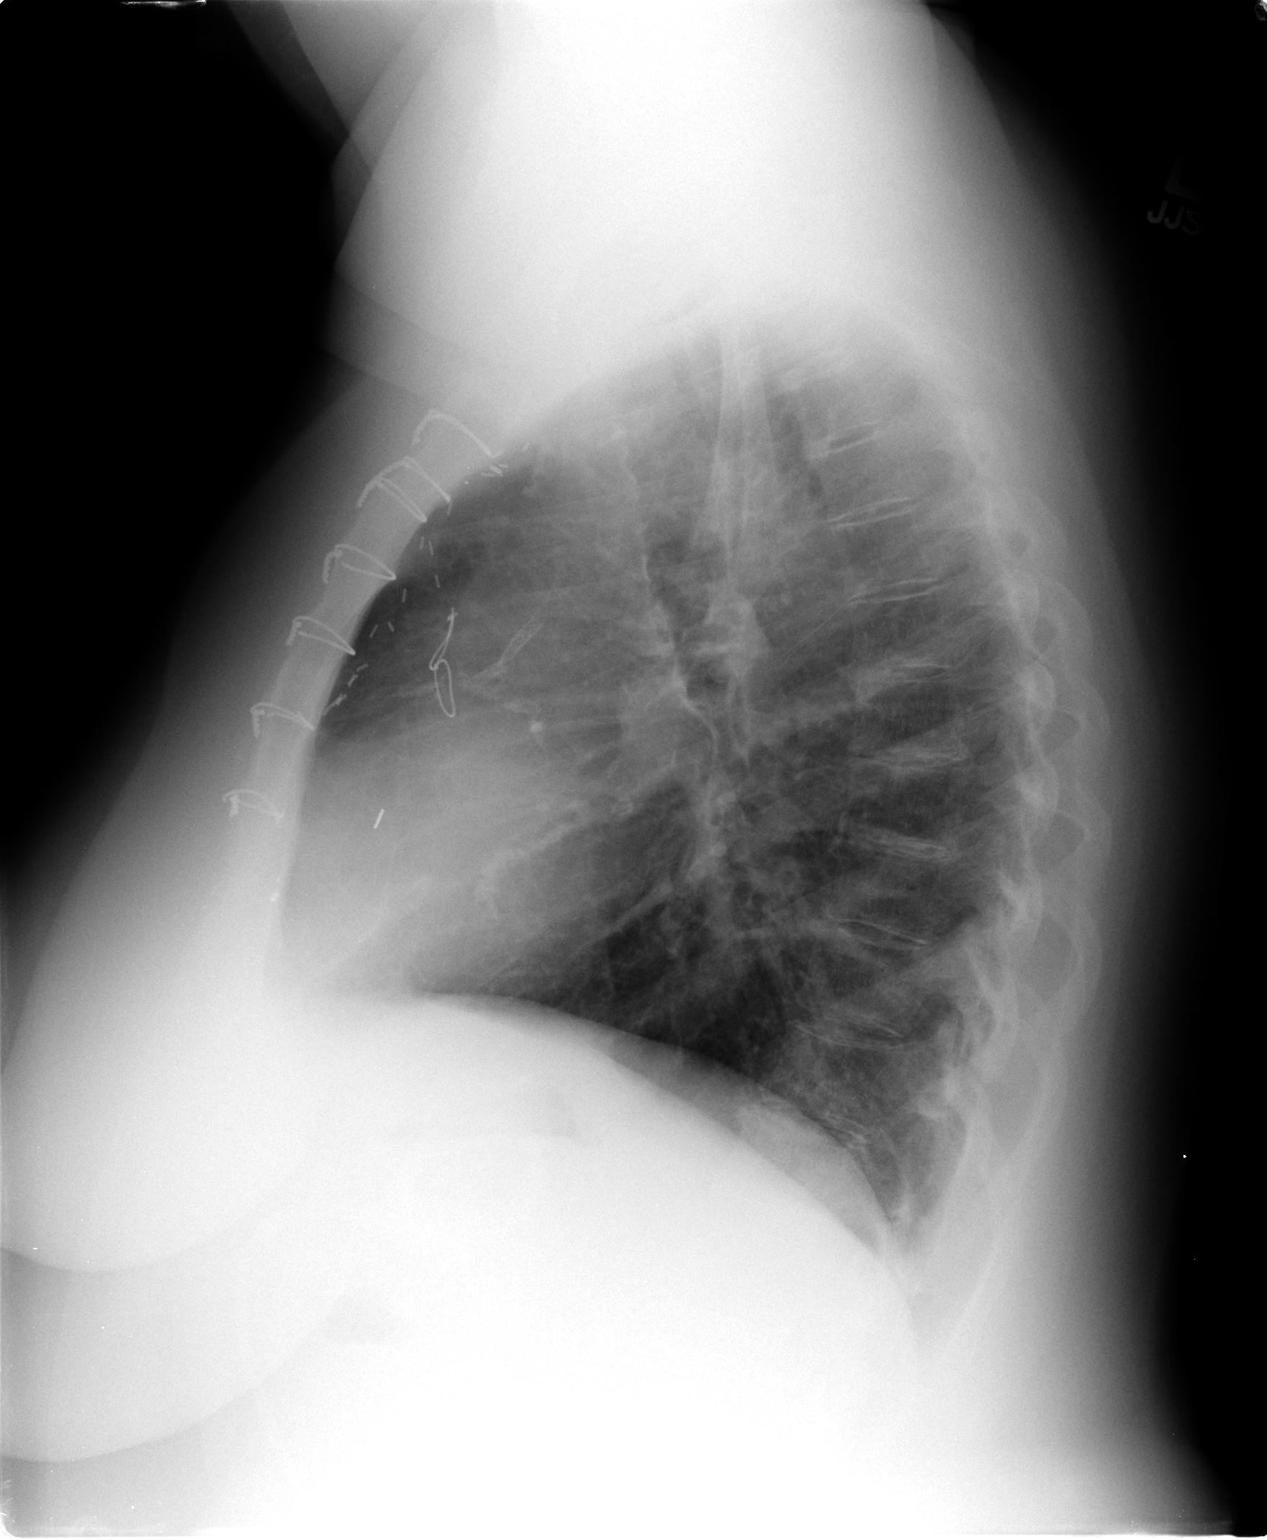

[2 of 2 positions shown; findings below may reference images not displayed]

FINDINGS: Cardiomediastinal silhouette is stable.  No acute
infiltrate or pulmonary edema.  Linear scarring or atelectasis
bilateral mid lung.  A gastric banding device is partially
visualized in upper abdomen.  Stable mild degenerative changes
thoracic spine. Again noted status post CABG.
IMPRESSION: No active disease.

## 2013-01-30 DIAGNOSIS — R5383 Other fatigue: Secondary | ICD-10-CM | POA: Diagnosis not present

## 2013-01-30 DIAGNOSIS — N390 Urinary tract infection, site not specified: Secondary | ICD-10-CM | POA: Diagnosis not present

## 2013-01-30 DIAGNOSIS — D649 Anemia, unspecified: Secondary | ICD-10-CM | POA: Diagnosis not present

## 2013-01-30 DIAGNOSIS — Z940 Kidney transplant status: Secondary | ICD-10-CM | POA: Diagnosis not present

## 2013-02-01 DIAGNOSIS — I10 Essential (primary) hypertension: Secondary | ICD-10-CM | POA: Diagnosis not present

## 2013-02-01 DIAGNOSIS — I2510 Atherosclerotic heart disease of native coronary artery without angina pectoris: Secondary | ICD-10-CM | POA: Diagnosis not present

## 2013-02-01 DIAGNOSIS — E782 Mixed hyperlipidemia: Secondary | ICD-10-CM | POA: Diagnosis not present

## 2013-02-03 ENCOUNTER — Other Ambulatory Visit (HOSPITAL_COMMUNITY): Payer: Self-pay | Admitting: Cardiovascular Disease

## 2013-02-03 DIAGNOSIS — R011 Cardiac murmur, unspecified: Secondary | ICD-10-CM

## 2013-02-03 DIAGNOSIS — R0989 Other specified symptoms and signs involving the circulatory and respiratory systems: Secondary | ICD-10-CM

## 2013-02-03 DIAGNOSIS — I25810 Atherosclerosis of coronary artery bypass graft(s) without angina pectoris: Secondary | ICD-10-CM

## 2013-02-06 ENCOUNTER — Other Ambulatory Visit: Payer: Self-pay | Admitting: Emergency Medicine

## 2013-02-06 ENCOUNTER — Encounter: Payer: Self-pay | Admitting: Cardiovascular Disease

## 2013-02-08 ENCOUNTER — Encounter (INDEPENDENT_AMBULATORY_CARE_PROVIDER_SITE_OTHER): Payer: Medicare Other | Admitting: General Surgery

## 2013-02-20 ENCOUNTER — Telehealth: Payer: Self-pay | Admitting: Cardiovascular Disease

## 2013-02-21 NOTE — Telephone Encounter (Signed)
She wants J C to call her!Said she called yesterday!

## 2013-02-22 ENCOUNTER — Ambulatory Visit (HOSPITAL_COMMUNITY)
Admission: RE | Admit: 2013-02-22 | Discharge: 2013-02-22 | Disposition: A | Discharge status: Home / Self Care | Payer: Medicare Other | Source: Ambulatory Visit | Attending: Cardiovascular Disease | Admitting: Cardiovascular Disease

## 2013-02-22 DIAGNOSIS — E785 Hyperlipidemia, unspecified: Secondary | ICD-10-CM | POA: Diagnosis not present

## 2013-02-22 DIAGNOSIS — I252 Old myocardial infarction: Secondary | ICD-10-CM | POA: Diagnosis not present

## 2013-02-22 DIAGNOSIS — I25810 Atherosclerosis of coronary artery bypass graft(s) without angina pectoris: Secondary | ICD-10-CM

## 2013-02-22 DIAGNOSIS — R011 Cardiac murmur, unspecified: Secondary | ICD-10-CM

## 2013-02-22 DIAGNOSIS — I2510 Atherosclerotic heart disease of native coronary artery without angina pectoris: Secondary | ICD-10-CM | POA: Insufficient documentation

## 2013-02-22 DIAGNOSIS — E782 Mixed hyperlipidemia: Secondary | ICD-10-CM | POA: Diagnosis not present

## 2013-02-22 NOTE — Progress Notes (Signed)
2D Echo Performed 02/22/2013    Marygrace Drought, RCS

## 2013-02-22 NOTE — Telephone Encounter (Signed)
Pt. Ativan refilled . Pt called. Pt. Will be in office today for an 2 -d- echo today

## 2013-03-01 ENCOUNTER — Encounter (HOSPITAL_COMMUNITY): Payer: Medicare Other

## 2013-03-07 DIAGNOSIS — IMO0001 Reserved for inherently not codable concepts without codable children: Secondary | ICD-10-CM | POA: Diagnosis not present

## 2013-03-07 DIAGNOSIS — G894 Chronic pain syndrome: Secondary | ICD-10-CM | POA: Diagnosis not present

## 2013-03-07 DIAGNOSIS — M542 Cervicalgia: Secondary | ICD-10-CM | POA: Diagnosis not present

## 2013-03-07 DIAGNOSIS — Z79899 Other long term (current) drug therapy: Secondary | ICD-10-CM | POA: Diagnosis not present

## 2013-03-16 ENCOUNTER — Encounter (INDEPENDENT_AMBULATORY_CARE_PROVIDER_SITE_OTHER): Payer: Medicare Other | Admitting: General Surgery

## 2013-03-28 ENCOUNTER — Encounter (HOSPITAL_COMMUNITY): Payer: Medicare Other

## 2013-03-29 ENCOUNTER — Other Ambulatory Visit: Payer: Self-pay | Admitting: Emergency Medicine

## 2013-03-29 ENCOUNTER — Other Ambulatory Visit: Payer: Self-pay | Admitting: Pain Medicine

## 2013-03-29 DIAGNOSIS — M545 Low back pain: Secondary | ICD-10-CM

## 2013-03-30 ENCOUNTER — Other Ambulatory Visit: Payer: Medicaid Other

## 2013-04-01 ENCOUNTER — Other Ambulatory Visit: Payer: Self-pay | Admitting: Emergency Medicine

## 2013-04-03 ENCOUNTER — Other Ambulatory Visit: Payer: Self-pay

## 2013-04-03 ENCOUNTER — Telehealth (HOSPITAL_COMMUNITY): Payer: Self-pay | Admitting: Cardiovascular Disease

## 2013-04-03 MED ORDER — ROSUVASTATIN CALCIUM 20 MG PO TABS: 20.0000 mg | ORAL_TABLET | Freq: Every day | ORAL | Status: DC

## 2013-04-03 NOTE — Telephone Encounter (Signed)
Rx was sent to pharmacy electronically. 

## 2013-04-04 ENCOUNTER — Other Ambulatory Visit: Payer: Self-pay | Admitting: Emergency Medicine

## 2013-04-06 ENCOUNTER — Other Ambulatory Visit: Payer: Self-pay | Admitting: Emergency Medicine

## 2013-04-06 DIAGNOSIS — D509 Iron deficiency anemia, unspecified: Secondary | ICD-10-CM | POA: Diagnosis not present

## 2013-04-06 DIAGNOSIS — I10 Essential (primary) hypertension: Secondary | ICD-10-CM | POA: Diagnosis not present

## 2013-04-06 DIAGNOSIS — E119 Type 2 diabetes mellitus without complications: Secondary | ICD-10-CM | POA: Diagnosis not present

## 2013-04-06 DIAGNOSIS — N390 Urinary tract infection, site not specified: Secondary | ICD-10-CM | POA: Diagnosis not present

## 2013-04-06 DIAGNOSIS — R209 Unspecified disturbances of skin sensation: Secondary | ICD-10-CM | POA: Diagnosis not present

## 2013-04-06 DIAGNOSIS — Z940 Kidney transplant status: Secondary | ICD-10-CM | POA: Diagnosis not present

## 2013-04-06 DIAGNOSIS — M79609 Pain in unspecified limb: Secondary | ICD-10-CM | POA: Diagnosis not present

## 2013-04-10 ENCOUNTER — Other Ambulatory Visit: Payer: Medicaid Other

## 2013-04-14 ENCOUNTER — Telehealth: Payer: Self-pay | Admitting: Cardiovascular Disease

## 2013-04-14 NOTE — Telephone Encounter (Signed)
Wants Katherine [name] to call-says it is personal.

## 2013-04-14 NOTE — Telephone Encounter (Signed)
JC called patient

## 2013-04-18 ENCOUNTER — Telehealth (INDEPENDENT_AMBULATORY_CARE_PROVIDER_SITE_OTHER): Payer: Self-pay

## 2013-04-18 NOTE — Telephone Encounter (Signed)
I received a verbal order from Dr Excell Seltzer to call in Vancomycin 100 mg po bid x 10 days, call if no better.  I called and notified the pt.  I called it in to [facility] on [address]

## 2013-04-18 NOTE — Telephone Encounter (Signed)
Patient called in asking for prescription for an antibiotic. She states she is starting to have a new boil come up. She states it is not infected at this time but want abx before it gets to that point. She does not want to come in the office to have an MD look at it. She states Dr Excell Seltzer would know what she is talking about because this happens all the time to her. Advised I will send him a message and we would call her back once he reviews it.

## 2013-04-18 NOTE — Telephone Encounter (Signed)
Received a call from Katherine [name].  He states Vancomycin doesn't come in 100 mg, but 125 mg.  I paged Dr Excell Seltzer and advised he meant Doxycycline 100 mg po BID x 10 days.  I called this in to [place] and spoke to [place].

## 2013-04-19 ENCOUNTER — Ambulatory Visit (HOSPITAL_COMMUNITY)
Admission: RE | Admit: 2013-04-19 | Discharge: 2013-04-19 | Disposition: A | Discharge status: Home / Self Care | Payer: Medicare Other | Source: Ambulatory Visit | Attending: Cardiovascular Disease | Admitting: Cardiovascular Disease

## 2013-04-19 DIAGNOSIS — R011 Cardiac murmur, unspecified: Secondary | ICD-10-CM | POA: Diagnosis not present

## 2013-04-19 DIAGNOSIS — R0989 Other specified symptoms and signs involving the circulatory and respiratory systems: Secondary | ICD-10-CM | POA: Diagnosis not present

## 2013-04-19 DIAGNOSIS — I6529 Occlusion and stenosis of unspecified carotid artery: Secondary | ICD-10-CM | POA: Diagnosis not present

## 2013-04-19 NOTE — Progress Notes (Signed)
Carotid Duplex Completed. Robertt Buda, BS, RDMS, RVT  

## 2013-04-24 ENCOUNTER — Inpatient Hospital Stay: Admission: RE | Admit: 2013-04-24 | Payer: Medicaid Other | Source: Ambulatory Visit

## 2013-05-03 ENCOUNTER — Encounter (INDEPENDENT_AMBULATORY_CARE_PROVIDER_SITE_OTHER): Payer: Self-pay | Admitting: General Surgery

## 2013-05-03 ENCOUNTER — Ambulatory Visit (INDEPENDENT_AMBULATORY_CARE_PROVIDER_SITE_OTHER): Payer: Medicare Other | Admitting: General Surgery

## 2013-05-03 VITALS — BP 118/72 | HR 68 | Temp 98.2°F | Resp 14 | Ht 61.0 in | Wt 174.6 lb

## 2013-05-03 DIAGNOSIS — L0291 Cutaneous abscess, unspecified: Secondary | ICD-10-CM

## 2013-05-03 DIAGNOSIS — Z9884 Bariatric surgery status: Secondary | ICD-10-CM | POA: Diagnosis not present

## 2013-05-03 MED ORDER — DOXYCYCLINE HYCLATE 100 MG PO TABS: 100.0000 mg | ORAL_TABLET | Freq: Two times a day (BID) | ORAL | Status: DC

## 2013-05-03 NOTE — Progress Notes (Signed)
Chief complaint: Followup lap band and recurrent MRSA infection  History: Patient returns for a scheduled followup for her lap band placement September 2005. She was last seen approximately one year ago. Since that time she has been doing pretty well. In terms of her lap band she still experiences good restriction. She does not really have any major symptoms of a restriction. She will get occasional nausea with certain foods but extremely rare regurgitation. She had some nighttime reflux and cough but this is not worsening and she says is really About what was before her lap band was placed. She has not had been hospitalized for any severe pulmonary infections. She has had good slow progressive weight loss as below.  We have called in a prescription of doxycycline several weeks ago for a recurrent small skin boil that resolved. However the last few days she's developed a small area on her right buttock that she is concerned about.  Exam: BP 118/72  Pulse 68  Temp(Src) 98.2 F (36.8 C) (Temporal)  Resp 14  Ht 5\' 1"  (1.549 m)  Wt 174 lb 9.6 oz (79.198 kg)  BMI 33.01 kg/m2 Total weight loss 103 pounds, 21 pounds since her last visit General: Appears well Lungs: Clear breath sounds bilaterally Abdomen: Soft and nontender. Incisions the port site looks fine Extremities: Overlying the upper right buttock is about a 8 mm slightly raised erythematous papule with no fluctuance.  Assessment plan: #1 status post lap band for obesity. She appears to have excellent restriction and excellent weight loss without apparent complications over restriction at this point. We left her band 2 cc today.  #2 Recurrent MRSA skin infections. She appears to have a very early infection on her upper right buttock that is not amenable to I&D. We will give her one more week of doxycycline and she will call if this does not resolve. Otherwise return for scheduled followup in one year.

## 2013-05-16 ENCOUNTER — Encounter (INDEPENDENT_AMBULATORY_CARE_PROVIDER_SITE_OTHER): Payer: Self-pay

## 2013-05-16 ENCOUNTER — Encounter: Payer: Self-pay | Admitting: Cardiovascular Disease

## 2013-05-16 DIAGNOSIS — E782 Mixed hyperlipidemia: Secondary | ICD-10-CM | POA: Diagnosis not present

## 2013-05-16 DIAGNOSIS — I2510 Atherosclerotic heart disease of native coronary artery without angina pectoris: Secondary | ICD-10-CM | POA: Diagnosis not present

## 2013-05-22 ENCOUNTER — Telehealth: Payer: Self-pay | Admitting: Cardiovascular Disease

## 2013-05-22 NOTE — Telephone Encounter (Signed)
JC advised.

## 2013-05-22 NOTE — Telephone Encounter (Signed)
Pt. Encouraged to call pulm. physcian or PCP for antibiotics . Pt. Stated understanding of instructions

## 2013-05-22 NOTE — Telephone Encounter (Signed)
Called pt. Left message on voicemail.

## 2013-05-22 NOTE — Telephone Encounter (Signed)
Wants  J C to call her-concerning her medicine.

## 2013-05-22 NOTE — Telephone Encounter (Signed)
Call sent to [facility] for review.

## 2013-05-23 ENCOUNTER — Ambulatory Visit
Admission: RE | Admit: 2013-05-23 | Discharge: 2013-05-23 | Disposition: A | Discharge status: Home / Self Care | Payer: Medicare Other | Source: Ambulatory Visit | Attending: Nephrology | Admitting: Nephrology

## 2013-05-23 ENCOUNTER — Other Ambulatory Visit: Payer: Self-pay | Admitting: Nephrology

## 2013-05-23 DIAGNOSIS — R0602 Shortness of breath: Secondary | ICD-10-CM

## 2013-05-23 DIAGNOSIS — R05 Cough: Secondary | ICD-10-CM

## 2013-05-30 ENCOUNTER — Other Ambulatory Visit: Payer: Medicare Other

## 2013-06-14 DIAGNOSIS — IMO0001 Reserved for inherently not codable concepts without codable children: Secondary | ICD-10-CM | POA: Diagnosis not present

## 2013-06-14 DIAGNOSIS — M19049 Primary osteoarthritis, unspecified hand: Secondary | ICD-10-CM | POA: Diagnosis not present

## 2013-06-26 IMAGING — CR DG CHEST 2V
2 series · 2 of 2 positions shown · non-contrast
Comparison: 12/23/2011.

CLINICAL DATA: Nausea with vomiting and fever.  History of
myocardial infarction and CABG.

CHEST - 2 VIEW

[w chest lat]
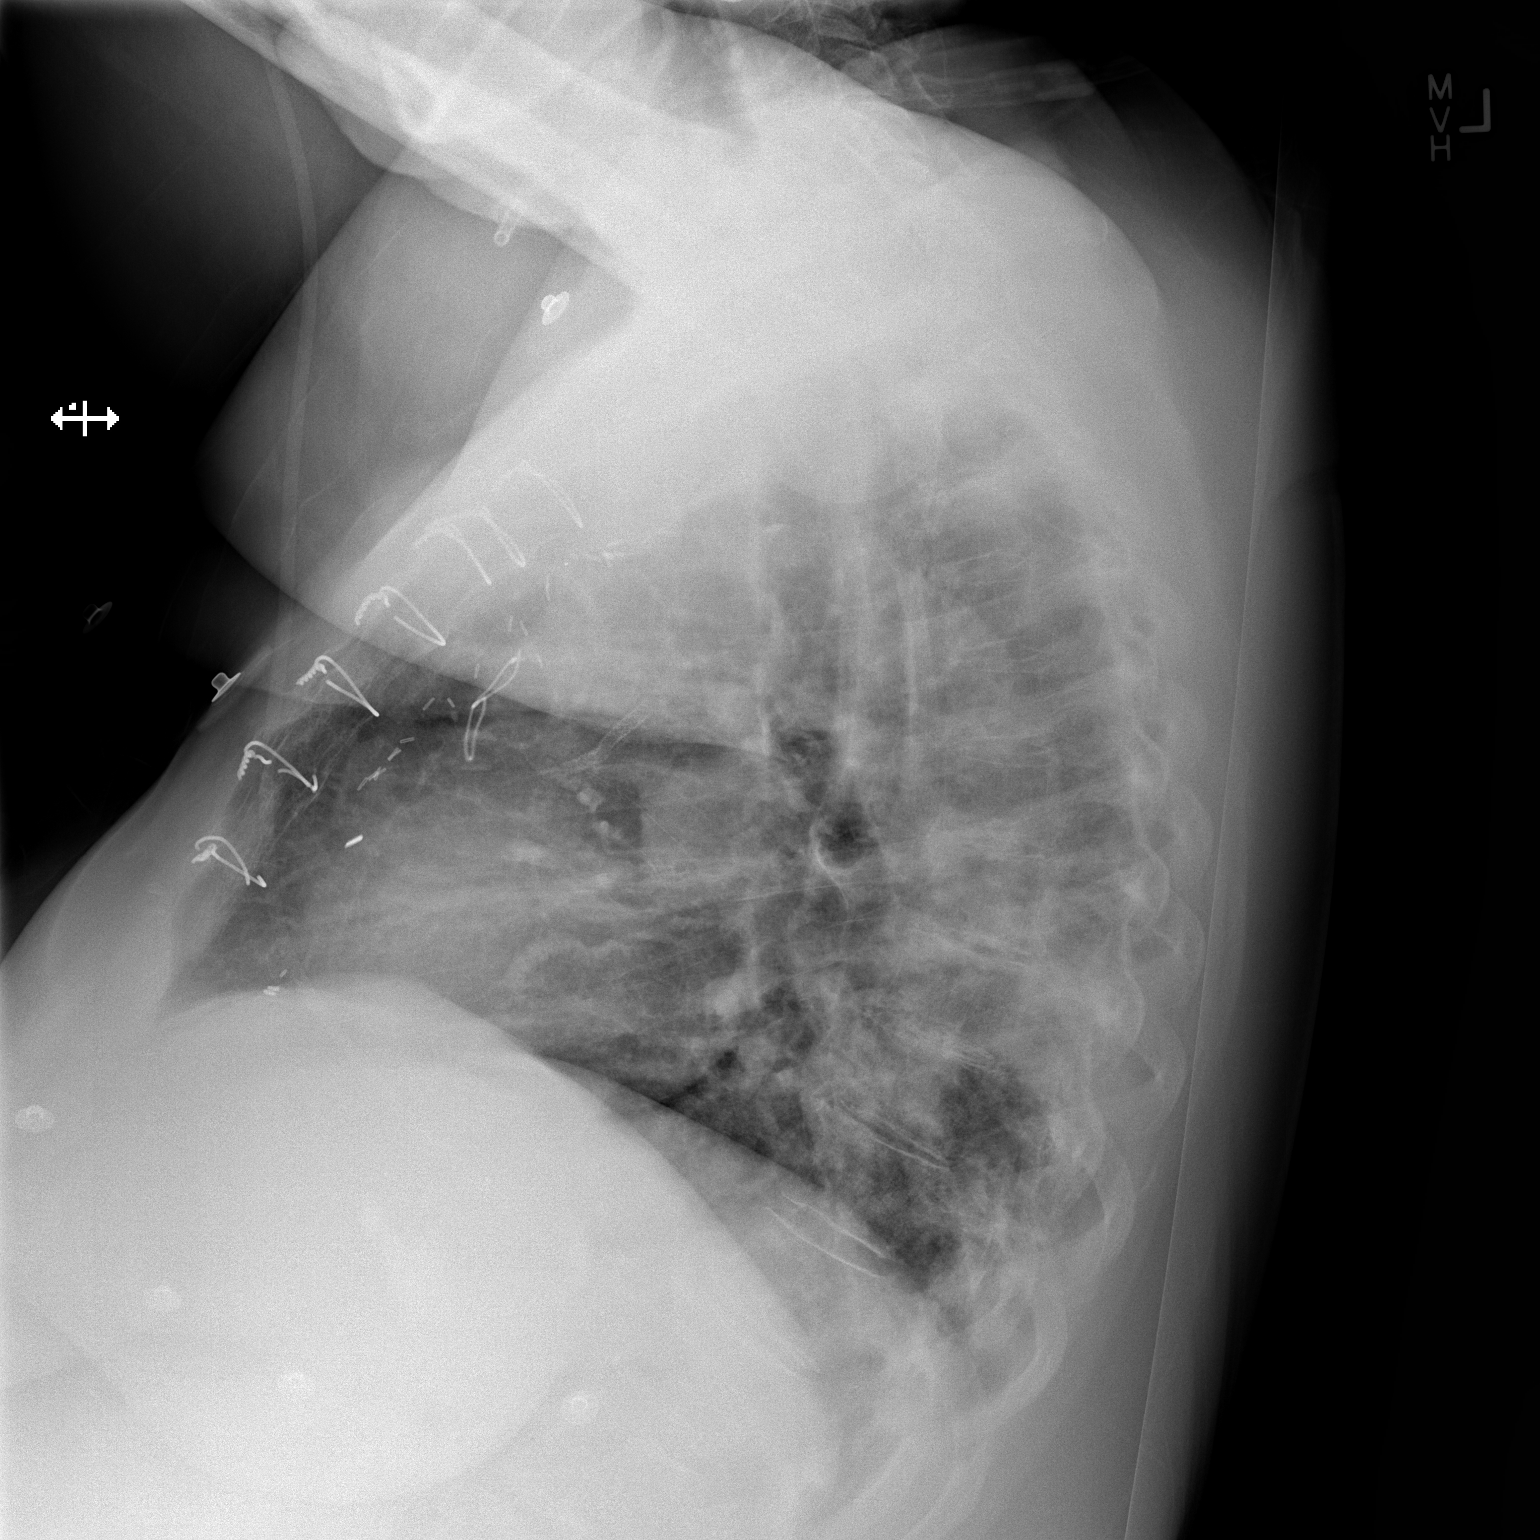

[x chest ap]
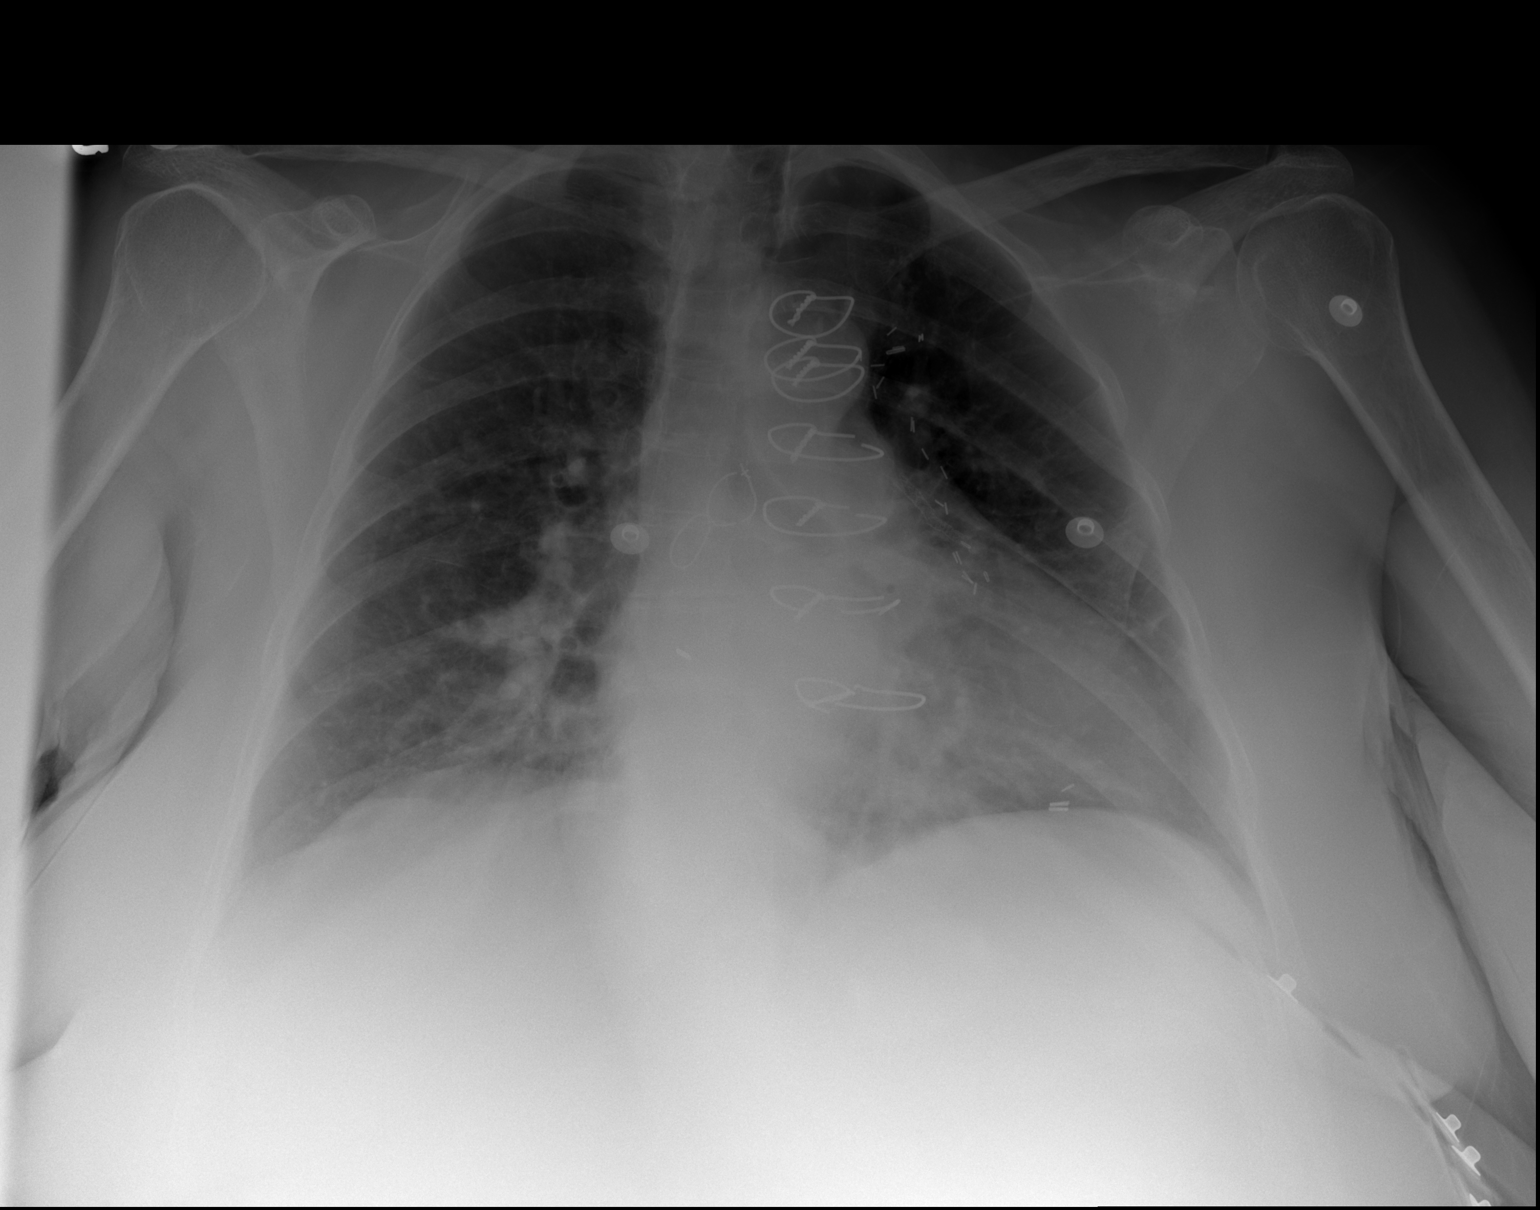

[2 of 2 positions shown; findings below may reference images not displayed]

FINDINGS: The heart size and mediastinal contours are stable status
post CABG.  Several of the sternotomy wires are chronically
fractured.  There are lower lung volumes with new patchy perihilar
opacities bilaterally.  No edema or confluent airspace opacity is
present.  There is no pleural effusion.
IMPRESSION: Low lung volumes with increased probable perihilar atelectasis.  No
consolidation or definite edema.

## 2013-06-27 IMAGING — CR DG CHEST 1V
1 series · 1 of 1 positions shown · non-contrast
Comparison: 06/12/2012 and CT chest 09/16/2010.

CLINICAL DATA: Spider bite.  Evaluate for pneumonia.

CHEST - 1 VIEW

[AP]
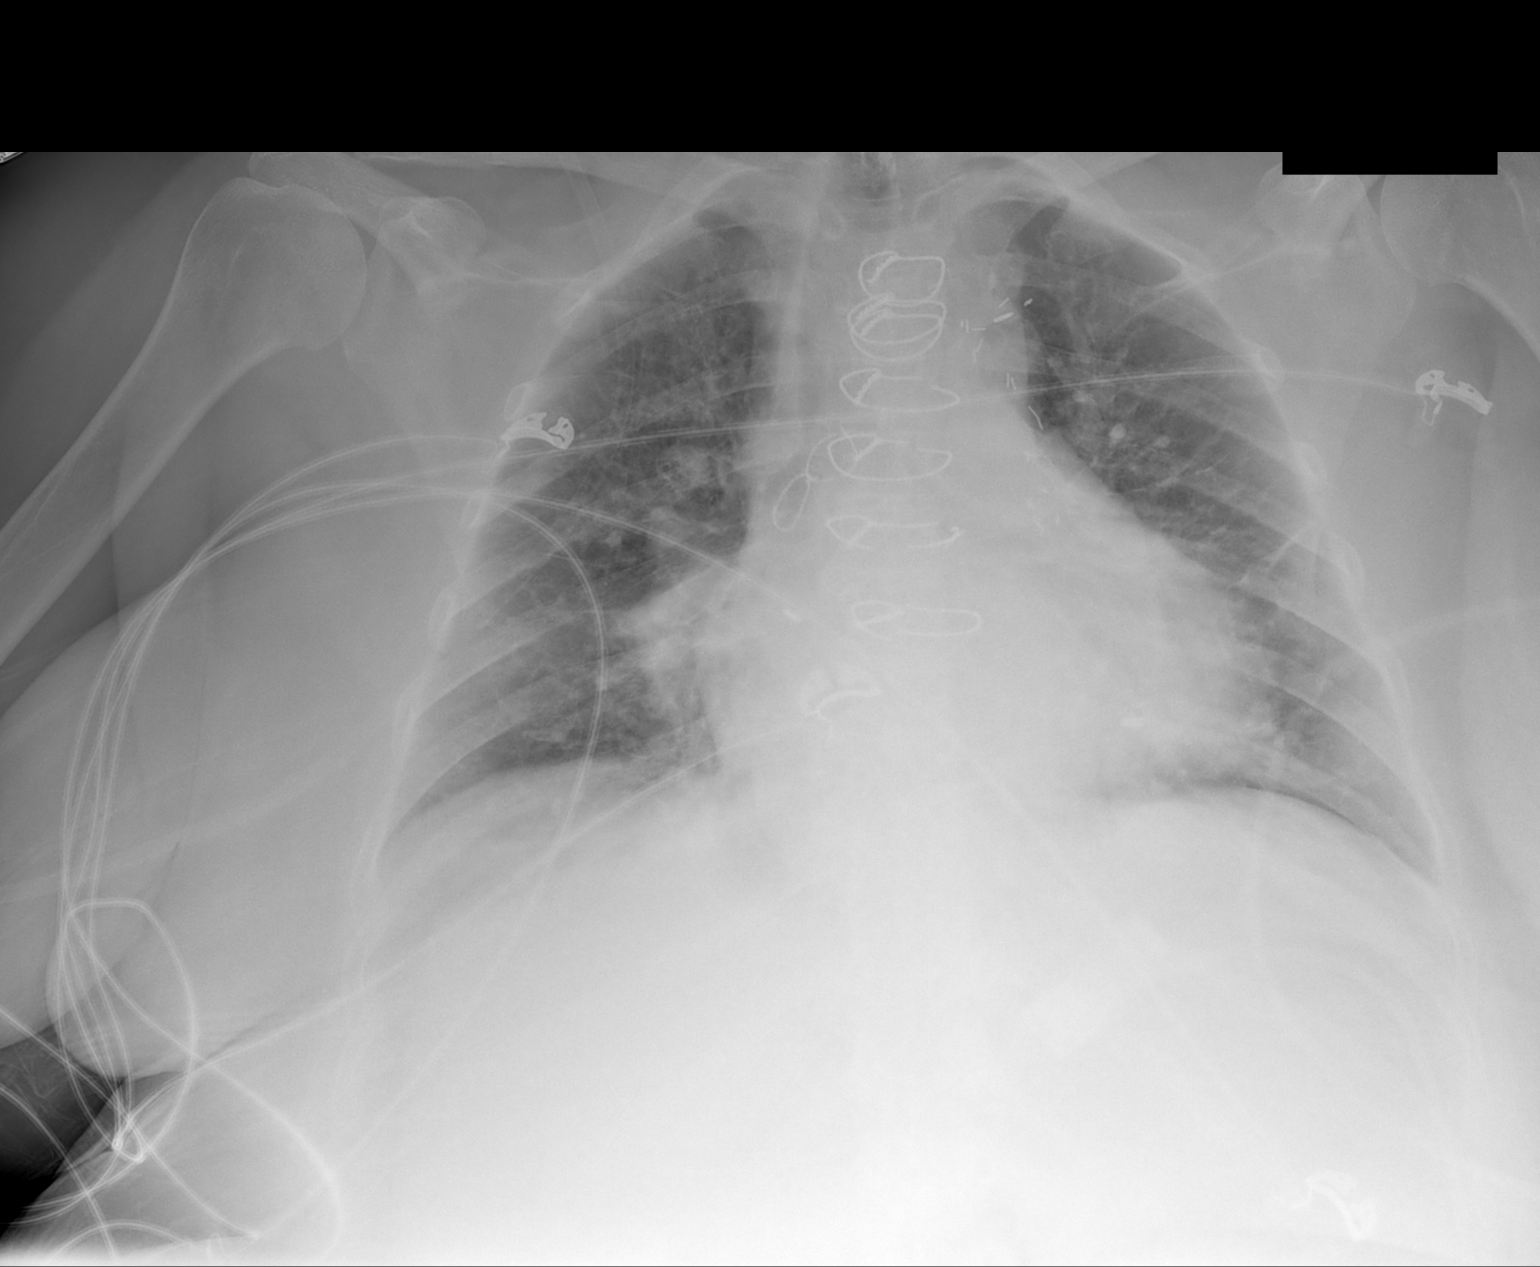

[1 of 1 positions shown; findings below may reference images not displayed]

FINDINGS: Trachea is midline.  Heart size stable.  There is added
density in the right infrahilar region.  Lungs are low in volume
but otherwise clear.  No pleural fluid.
IMPRESSION: Added density in the right infrahilar region may be due to slight
rotation and portable semi upright technique.  Reevaluation with PA
and lateral views, when the patient is able, is recommended.

## 2013-07-15 IMAGING — CR DG CHEST 2V
2 series · 2 of 2 positions shown · non-contrast
Comparison: 06/16/2029

CLINICAL DATA: Productive cough, follow up pneumonia

CHEST - 2 VIEW

[view not recorded (1 of 2)]
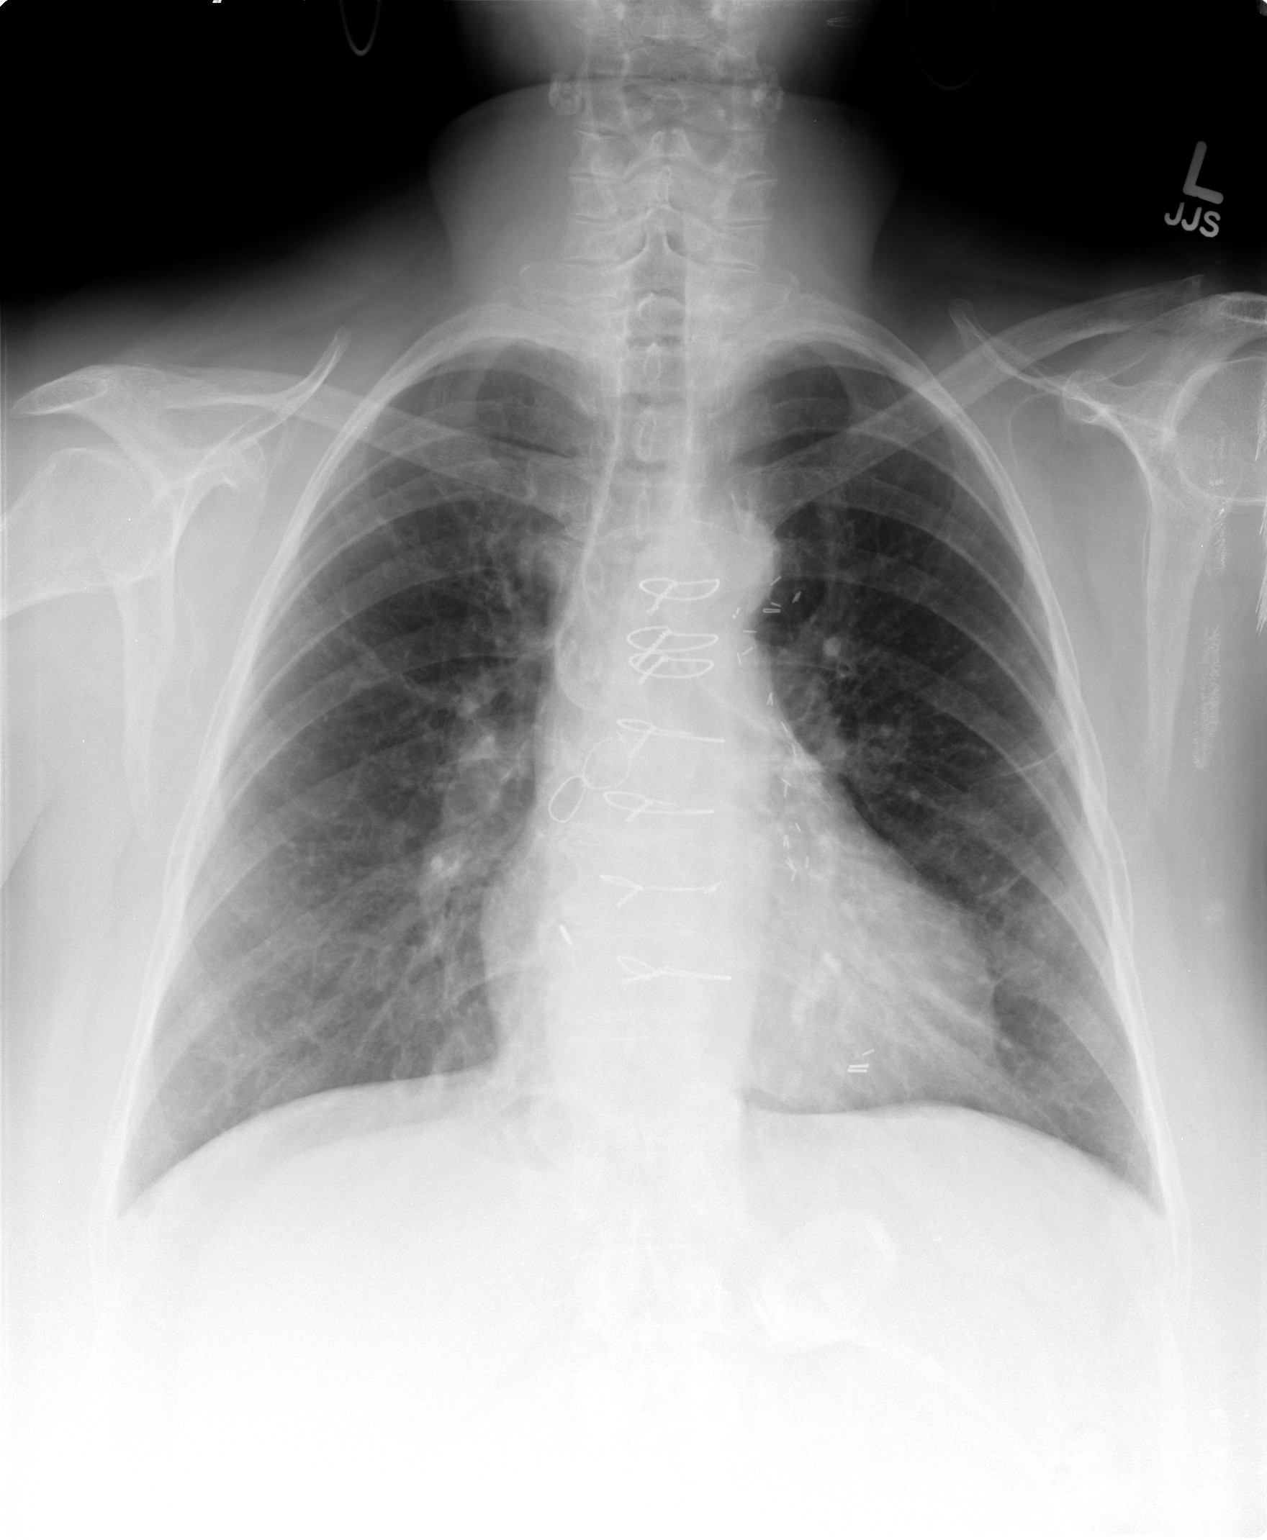

[view not recorded (2 of 2)]
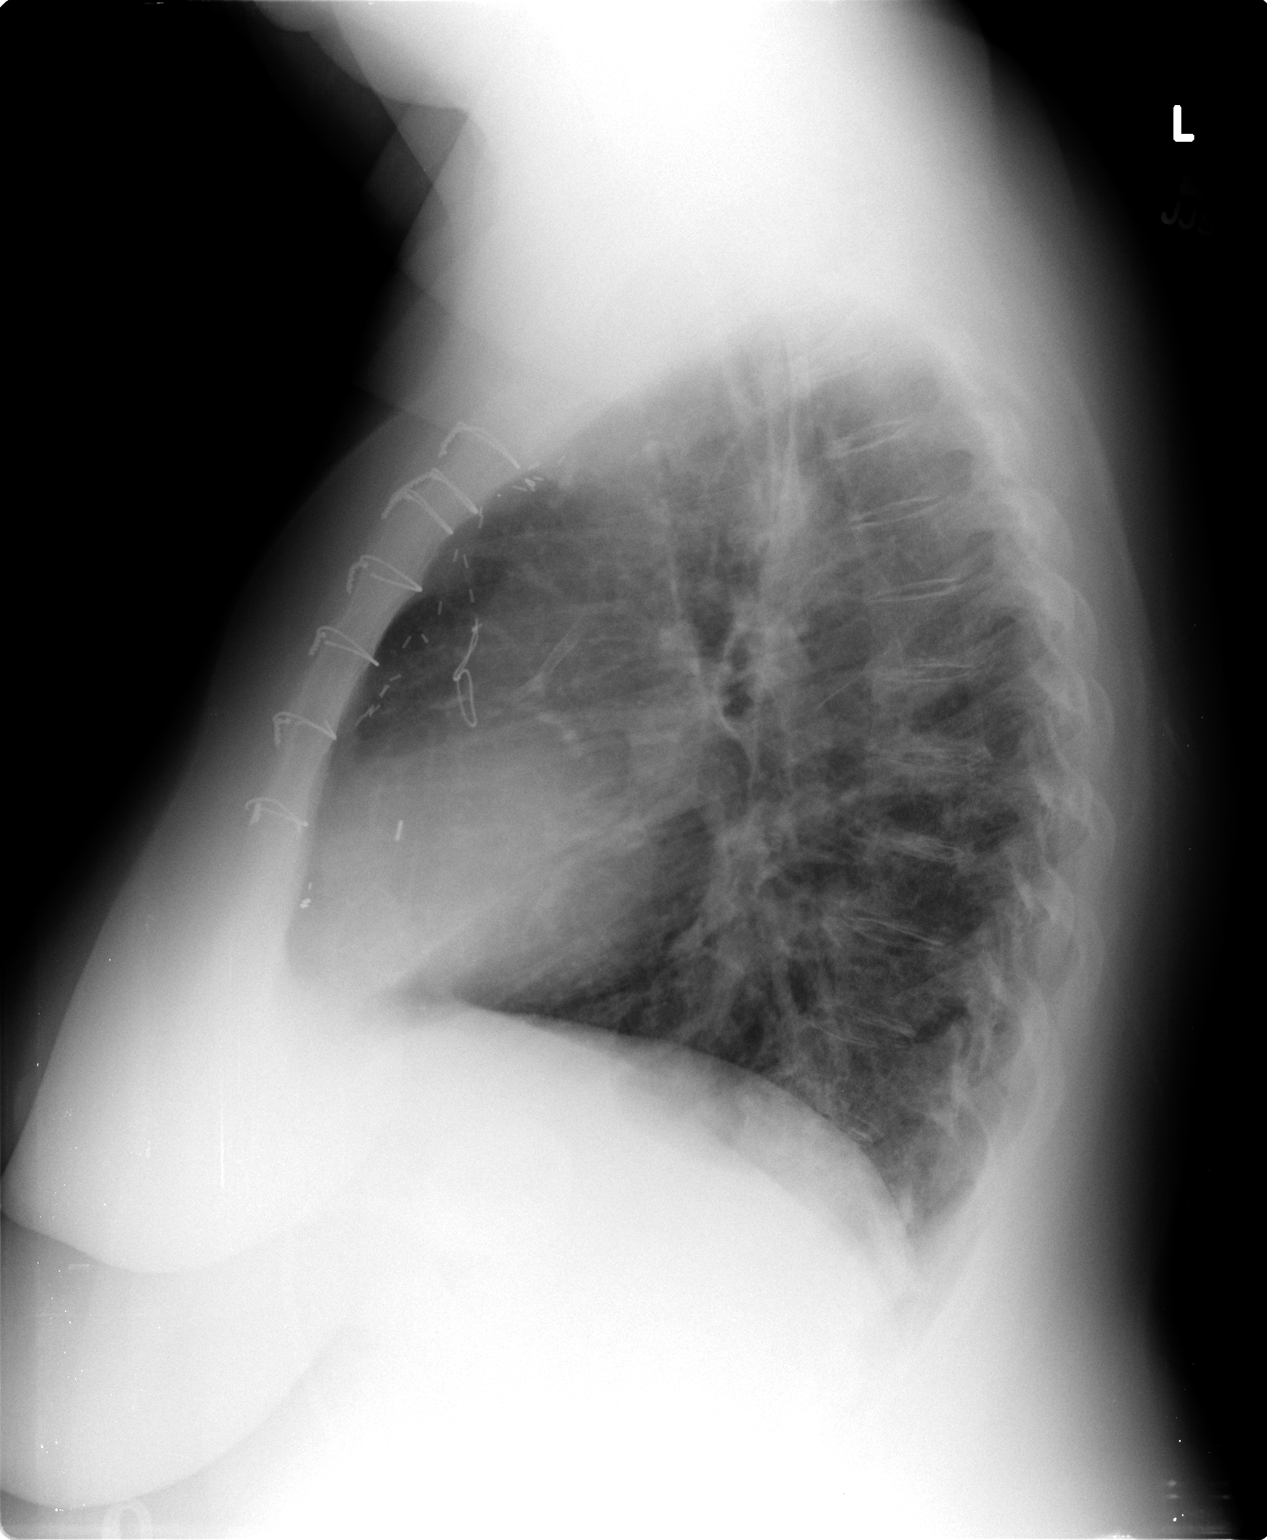

[2 of 2 positions shown; findings below may reference images not displayed]

FINDINGS: Chronic interstitial markings.  No focal consolidation.
Prior right basilar opacity has resolved.  Minimal lingular
scarring.  No pleural effusion or pneumothorax.

Cardiomediastinal silhouette is within normal limits. Postsurgical
changes related to prior CABG.

Degenerative changes of the visualized thoracolumbar spine.
IMPRESSION: No evidence of acute cardiopulmonary disease.

## 2013-07-25 ENCOUNTER — Telehealth: Payer: Self-pay | Admitting: *Deleted

## 2013-07-25 ENCOUNTER — Telehealth: Payer: Self-pay | Admitting: Cardiovascular Disease

## 2013-07-25 NOTE — Telephone Encounter (Signed)
Please have J C to call her concerning her medicine.

## 2013-07-25 NOTE — Telephone Encounter (Signed)
Pt. Informed that she would need to get her lorazepam from her PCP

## 2013-07-25 NOTE — Telephone Encounter (Signed)
Sent to [facility] to advise

## 2013-07-25 NOTE — Telephone Encounter (Signed)
Medication refill refused. Patient instructed by JC, LPN to get refills from PCP.

## 2013-07-26 DIAGNOSIS — M7989 Other specified soft tissue disorders: Secondary | ICD-10-CM | POA: Diagnosis not present

## 2013-07-26 DIAGNOSIS — M2550 Pain in unspecified joint: Secondary | ICD-10-CM | POA: Diagnosis not present

## 2013-07-26 DIAGNOSIS — M19049 Primary osteoarthritis, unspecified hand: Secondary | ICD-10-CM | POA: Diagnosis not present

## 2013-07-26 DIAGNOSIS — M19039 Primary osteoarthritis, unspecified wrist: Secondary | ICD-10-CM | POA: Diagnosis not present

## 2013-07-26 DIAGNOSIS — IMO0001 Reserved for inherently not codable concepts without codable children: Secondary | ICD-10-CM | POA: Diagnosis not present

## 2013-07-31 ENCOUNTER — Other Ambulatory Visit: Payer: Self-pay | Admitting: Emergency Medicine

## 2013-08-03 ENCOUNTER — Ambulatory Visit: Payer: Medicare Other | Admitting: Emergency Medicine

## 2013-08-03 ENCOUNTER — Telehealth (INDEPENDENT_AMBULATORY_CARE_PROVIDER_SITE_OTHER): Payer: Self-pay

## 2013-08-03 DIAGNOSIS — Z8614 Personal history of Methicillin resistant Staphylococcus aureus infection: Secondary | ICD-10-CM

## 2013-08-03 MED ORDER — DOXYCYCLINE HYCLATE 100 MG PO TABS: 100.0000 mg | ORAL_TABLET | Freq: Two times a day (BID) | ORAL | Status: DC

## 2013-08-03 NOTE — Telephone Encounter (Signed)
Pt called stating she has an abscess forming on her buttock and lower back. No drainage.  Pt states Dr Excell Seltzer usually calls in Doxycycline for her. Pt states she did have low grade temp last night of 99.6. I advised pt this request will be sent to Dr Excell Seltzer and Alyse Low for review. Pt can be reached at [phone].

## 2013-08-03 NOTE — Addendum Note (Signed)
Addended by: Ivor Costa on: 08/03/2013 12:20 PM   Modules accepted: Orders

## 2013-08-03 NOTE — Telephone Encounter (Signed)
Doxycycline 100mg  tabs ordered per Dr. Excell Seltzer and sent to patient's pharmacy.  Patient notified

## 2013-08-11 ENCOUNTER — Other Ambulatory Visit: Payer: Self-pay | Admitting: Cardiology

## 2013-08-11 ENCOUNTER — Other Ambulatory Visit: Payer: Self-pay | Admitting: Emergency Medicine

## 2013-08-11 NOTE — Telephone Encounter (Signed)
Last OV with RB: 02/05/12 Was seen by TP in 04/2012 and again 07/01/12. No pending appt. rx sent x 1 with instructions that pt needs OV with RB for further refills.

## 2013-08-14 NOTE — Telephone Encounter (Signed)
Rx was sent to pharmacy electronically. 

## 2013-08-15 DIAGNOSIS — M2550 Pain in unspecified joint: Secondary | ICD-10-CM | POA: Diagnosis not present

## 2013-08-15 DIAGNOSIS — M25539 Pain in unspecified wrist: Secondary | ICD-10-CM | POA: Diagnosis not present

## 2013-08-17 DIAGNOSIS — I739 Peripheral vascular disease, unspecified: Secondary | ICD-10-CM

## 2013-08-17 HISTORY — DX: Peripheral vascular disease, unspecified: I73.9

## 2013-08-17 HISTORY — PX: OTHER SURGICAL HISTORY: SHX169

## 2013-08-21 DIAGNOSIS — I200 Unstable angina: Secondary | ICD-10-CM | POA: Insufficient documentation

## 2013-08-22 ENCOUNTER — Emergency Department (HOSPITAL_COMMUNITY): Payer: Medicare Other

## 2013-08-22 ENCOUNTER — Encounter (HOSPITAL_COMMUNITY): Payer: Self-pay | Admitting: Emergency Medicine

## 2013-08-22 ENCOUNTER — Inpatient Hospital Stay (HOSPITAL_COMMUNITY)
Admission: EM | Admit: 2013-08-22 | Discharge: 2013-08-24 | DRG: 247 | Disposition: A | Discharge status: Home / Self Care | Payer: Medicare Other | Attending: Internal Medicine | Admitting: Internal Medicine

## 2013-08-22 DIAGNOSIS — I200 Unstable angina: Secondary | ICD-10-CM | POA: Diagnosis present

## 2013-08-22 DIAGNOSIS — I129 Hypertensive chronic kidney disease with stage 1 through stage 4 chronic kidney disease, or unspecified chronic kidney disease: Secondary | ICD-10-CM | POA: Diagnosis present

## 2013-08-22 DIAGNOSIS — I70209 Unspecified atherosclerosis of native arteries of extremities, unspecified extremity: Secondary | ICD-10-CM | POA: Diagnosis present

## 2013-08-22 DIAGNOSIS — J449 Chronic obstructive pulmonary disease, unspecified: Secondary | ICD-10-CM | POA: Diagnosis present

## 2013-08-22 DIAGNOSIS — I6529 Occlusion and stenosis of unspecified carotid artery: Secondary | ICD-10-CM | POA: Diagnosis present

## 2013-08-22 DIAGNOSIS — J329 Chronic sinusitis, unspecified: Secondary | ICD-10-CM

## 2013-08-22 DIAGNOSIS — Z951 Presence of aortocoronary bypass graft: Secondary | ICD-10-CM

## 2013-08-22 DIAGNOSIS — Z888 Allergy status to other drugs, medicaments and biological substances status: Secondary | ICD-10-CM | POA: Diagnosis not present

## 2013-08-22 DIAGNOSIS — J069 Acute upper respiratory infection, unspecified: Secondary | ICD-10-CM

## 2013-08-22 DIAGNOSIS — F329 Major depressive disorder, single episode, unspecified: Secondary | ICD-10-CM | POA: Diagnosis present

## 2013-08-22 DIAGNOSIS — G4733 Obstructive sleep apnea (adult) (pediatric): Secondary | ICD-10-CM | POA: Diagnosis present

## 2013-08-22 DIAGNOSIS — R0602 Shortness of breath: Secondary | ICD-10-CM | POA: Diagnosis not present

## 2013-08-22 DIAGNOSIS — I498 Other specified cardiac arrhythmias: Secondary | ICD-10-CM | POA: Diagnosis not present

## 2013-08-22 DIAGNOSIS — Z87891 Personal history of nicotine dependence: Secondary | ICD-10-CM

## 2013-08-22 DIAGNOSIS — R05 Cough: Secondary | ICD-10-CM

## 2013-08-22 DIAGNOSIS — E785 Hyperlipidemia, unspecified: Secondary | ICD-10-CM | POA: Diagnosis not present

## 2013-08-22 DIAGNOSIS — Z8614 Personal history of Methicillin resistant Staphylococcus aureus infection: Secondary | ICD-10-CM

## 2013-08-22 DIAGNOSIS — J209 Acute bronchitis, unspecified: Secondary | ICD-10-CM

## 2013-08-22 DIAGNOSIS — I214 Non-ST elevation (NSTEMI) myocardial infarction: Secondary | ICD-10-CM | POA: Diagnosis present

## 2013-08-22 DIAGNOSIS — J4489 Other specified chronic obstructive pulmonary disease: Secondary | ICD-10-CM | POA: Diagnosis present

## 2013-08-22 DIAGNOSIS — IMO0002 Reserved for concepts with insufficient information to code with codable children: Secondary | ICD-10-CM

## 2013-08-22 DIAGNOSIS — Z881 Allergy status to other antibiotic agents status: Secondary | ICD-10-CM | POA: Diagnosis not present

## 2013-08-22 DIAGNOSIS — Z9851 Tubal ligation status: Secondary | ICD-10-CM | POA: Diagnosis not present

## 2013-08-22 DIAGNOSIS — Z7902 Long term (current) use of antithrombotics/antiplatelets: Secondary | ICD-10-CM | POA: Diagnosis not present

## 2013-08-22 DIAGNOSIS — Z882 Allergy status to sulfonamides status: Secondary | ICD-10-CM

## 2013-08-22 DIAGNOSIS — D72829 Elevated white blood cell count, unspecified: Secondary | ICD-10-CM

## 2013-08-22 DIAGNOSIS — Z940 Kidney transplant status: Secondary | ICD-10-CM | POA: Diagnosis not present

## 2013-08-22 DIAGNOSIS — IMO0001 Reserved for inherently not codable concepts without codable children: Secondary | ICD-10-CM | POA: Diagnosis present

## 2013-08-22 DIAGNOSIS — N189 Chronic kidney disease, unspecified: Secondary | ICD-10-CM | POA: Diagnosis present

## 2013-08-22 DIAGNOSIS — F411 Generalized anxiety disorder: Secondary | ICD-10-CM | POA: Diagnosis present

## 2013-08-22 DIAGNOSIS — Z79899 Other long term (current) drug therapy: Secondary | ICD-10-CM | POA: Diagnosis not present

## 2013-08-22 DIAGNOSIS — F3289 Other specified depressive episodes: Secondary | ICD-10-CM | POA: Diagnosis present

## 2013-08-22 DIAGNOSIS — R079 Chest pain, unspecified: Secondary | ICD-10-CM | POA: Diagnosis present

## 2013-08-22 DIAGNOSIS — R509 Fever, unspecified: Secondary | ICD-10-CM | POA: Diagnosis not present

## 2013-08-22 DIAGNOSIS — J309 Allergic rhinitis, unspecified: Secondary | ICD-10-CM

## 2013-08-22 DIAGNOSIS — L0390 Cellulitis, unspecified: Secondary | ICD-10-CM

## 2013-08-22 DIAGNOSIS — E876 Hypokalemia: Secondary | ICD-10-CM

## 2013-08-22 DIAGNOSIS — J189 Pneumonia, unspecified organism: Secondary | ICD-10-CM

## 2013-08-22 DIAGNOSIS — I25810 Atherosclerosis of coronary artery bypass graft(s) without angina pectoris: Secondary | ICD-10-CM | POA: Diagnosis present

## 2013-08-22 DIAGNOSIS — Z8249 Family history of ischemic heart disease and other diseases of the circulatory system: Secondary | ICD-10-CM

## 2013-08-22 DIAGNOSIS — I2510 Atherosclerotic heart disease of native coronary artery without angina pectoris: Secondary | ICD-10-CM

## 2013-08-22 DIAGNOSIS — Z7982 Long term (current) use of aspirin: Secondary | ICD-10-CM

## 2013-08-22 DIAGNOSIS — K219 Gastro-esophageal reflux disease without esophagitis: Secondary | ICD-10-CM

## 2013-08-22 DIAGNOSIS — R0789 Other chest pain: Secondary | ICD-10-CM | POA: Diagnosis not present

## 2013-08-22 DIAGNOSIS — T8610 Unspecified complication of kidney transplant: Secondary | ICD-10-CM

## 2013-08-22 DIAGNOSIS — R059 Cough, unspecified: Secondary | ICD-10-CM

## 2013-08-22 DIAGNOSIS — Z800 Family history of malignant neoplasm of digestive organs: Secondary | ICD-10-CM

## 2013-08-22 DIAGNOSIS — H60509 Unspecified acute noninfective otitis externa, unspecified ear: Secondary | ICD-10-CM

## 2013-08-22 DIAGNOSIS — I059 Rheumatic mitral valve disease, unspecified: Secondary | ICD-10-CM

## 2013-08-22 DIAGNOSIS — Z9884 Bariatric surgery status: Secondary | ICD-10-CM

## 2013-08-22 DIAGNOSIS — I252 Old myocardial infarction: Secondary | ICD-10-CM | POA: Diagnosis not present

## 2013-08-22 DIAGNOSIS — I2119 ST elevation (STEMI) myocardial infarction involving other coronary artery of inferior wall: Secondary | ICD-10-CM

## 2013-08-22 DIAGNOSIS — I3481 Nonrheumatic mitral (valve) annulus calcification: Secondary | ICD-10-CM

## 2013-08-22 LAB — CBC
HEMATOCRIT: 37.4 % (ref 36.0–46.0)
Hemoglobin: 12 g/dL (ref 12.0–15.0)
MCH: 29 pg (ref 26.0–34.0)
MCHC: 32.1 g/dL (ref 30.0–36.0)
MCV: 90.3 fL (ref 78.0–100.0)
Platelets: 249 10*3/uL (ref 150–400)
RBC: 4.14 MIL/uL (ref 3.87–5.11)
RDW: 14.2 % (ref 11.5–15.5)
WBC: 8.2 10*3/uL (ref 4.0–10.5)

## 2013-08-22 LAB — TROPONIN I: Troponin I: <0.3 ng/mL (ref <0.30)

## 2013-08-22 LAB — POCT I-STAT TROPONIN I: TROPONIN I, POC: 0 ng/mL (ref 0.00–0.08)

## 2013-08-22 MED ORDER — SODIUM CHLORIDE 0.9 % IV BOLUS (SEPSIS)
1000.0000 mL | Freq: Once | INTRAVENOUS | Status: AC
  Administered 2013-08-22: 1000 mL via INTRAVENOUS

## 2013-08-22 MED ORDER — NITROGLYCERIN 0.4 MG SL SUBL: 0.4000 mg | SUBLINGUAL_TABLET | SUBLINGUAL | Status: DC | PRN

## 2013-08-22 NOTE — ED Notes (Addendum)
PER EMS: pt from home, was talking on phone at [place] and began to have CP and onset of diaphoresis, denies N/V. Pt took one nitro and 4 baby aspirin and pain decreased from 8 to 0, pt now chest pain free but reports back pain between shoulder blades. Hx of MI in 1994 with stent placement. Pt also reports she has had an URI for 2 months, coughing up green sputum, SOB, EMS reports lung sounds clear and 96% RA, EKG-NSR. Initial BP 118/64 but decreased to 96/69, HR-68. Pt A&Ox4, ambulatory.

## 2013-08-22 NOTE — ED Provider Notes (Signed)
CSN: [id number]     Arrival date & time 08/22/13  2102 History   First MD Initiated Contact with Patient 08/22/13 2108     Chief Complaint  Patient presents with  . Chest Pain   (Consider location/radiation/quality/duration/timing/severity/associated sxs/prior Treatment) HPI Comments: Patient is a 58 year old female past medical history significant for MI (1994), CABG x 3 (1995), CKD, HTN, CAD, Dyslipidemia, Carotid Stenosis presenting to the ED for acute onset central chest pressure/burning sensation with radiation to bilateral arms w/ associated SOB, nausea, diaphoresis around 7:30PM this evening. Patient states she took one sublingual nitroglycerin and chewed four baby aspirin before calling 911. She states her symptoms have alleviated at this time, but she was concerned as she states this is how her last heart attack presented. Patient had been followed by Dr. [name] until end of September at the time of his retirement. Patient is set to follow with Dr. Ellyn Hack at her upcoming appointment. Patient had a clean echocardiogram in September 2014. She states she has had a stress test without acute abnormality within the last year. She denies any cardiac catheterizations or surgeries since 1995.  Patient is a 58 y.o. female presenting with chest pain.  Chest Pain Associated symptoms: diaphoresis, nausea and shortness of breath   Associated symptoms: no fever     Past Medical History  Diagnosis Date  . Chronic kidney disease   . Heart attack   . Hypertension   . Allergy   . Fibromyalgia   . Boils     lft and rt thigh, lft groin  . MRSA (methicillin resistant Staphylococcus aureus)   . Anxiety   . Depression   . Vomiting   . Poor appetite   . CAD (coronary artery disease) 10/21/0,48/27/03    restenting distal anastomotic site with drug eluting Taxus stent 90-10%;  [name] Kitchen S/P CABG (coronary artery bypass graft) 1995  . COPD (chronic obstructive pulmonary disease)   . OSA (obstructive sleep  apnea)   . Dyslipidemia   . 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 Carotid stenosis 07/29/2009    right &left BULBS & ICA's plaque 0-49%   Past Surgical History  Procedure Laterality Date  . Laparoscopic gastric banding  04/2004  . Catheter removal    . Coronary artery bypass graft  1995  . Kidney transplant    . Wrist fistula repair    . Tubal ligation    . Cardiac catheterization  06/07/2003    successful restenting of distal anastomotic site with drug eluting Taxus stent 90-10%;2001 SVG stenting diagonal;;10/28/00 tandem stenting SVG diagonal and tandem stenting 04/12/02  . Port-a-cath removal      kidney  . Incise and drain abcess     Family History  Problem Relation Age of Onset  . Cancer Mother     liver  . Heart disease Father   . Cancer Father     colon   History  Substance Use Topics  . Smoking status: Former Smoker -- 1.00 packs/day for 30 years    Types: Cigarettes    Quit date: 08/17/2002  . Smokeless tobacco: Never Used  . Alcohol Use: No   OB History   Grav Para Term Preterm Abortions TAB SAB Ect Mult Living                 Review of Systems  Constitutional: Positive for diaphoresis. Negative for fever.  HENT: Positive for sinus  pressure.   Respiratory: Positive for shortness of breath.   Cardiovascular: Positive for chest pain.  Gastrointestinal: Positive for nausea.  All other systems reviewed and are negative.    Allergies  Codeine; Erythromycin; Hydromorphone hcl; Nalbuphine; Niaspan; Sulfonamide derivatives; and Tetracycline  Home Medications   Current Outpatient Rx  Name  Route  Sig  Dispense  Refill  . aspirin EC 81 MG tablet   Oral   Take 81 mg by mouth daily.         [name] Kitchen azaTHIOprine (IMURAN) 50 MG tablet   Oral   Take 125 mg by mouth daily.         . buprenorphine (BUTRANS) 20 MCG/HR PTWK   Transdermal   Place 20 mcg onto the skin  once a week.          . calcitRIOL (ROCALTROL) 0.25 MCG capsule   Oral   Take 0.25 mcg by mouth daily.         . Calcium Citrate-Vitamin D (CITRACAL + D PO)   Oral   Take by mouth daily.         . clopidogrel (PLAVIX) 75 MG tablet      TAKE 1 TABLET BY MOUTH EVERY DAY   30 tablet   2   . cyclobenzaprine (FLEXERIL) 10 MG tablet   Oral   Take 10 mg by mouth 3 (three) times daily.          . DULoxetine (CYMBALTA) 30 MG capsule   Oral   Take 30 mg by mouth daily.           . fluticasone (FLONASE) 50 MCG/ACT nasal spray      PLACE 2 SPRAYS INTO THE NOSE DAILY.   8.5 g   2   . HYDROcodone-acetaminophen (NORCO/VICODIN) 5-325 MG per tablet   Oral   Take 1 tablet by mouth 2 (two) times daily. Take every day per patient         . LORazepam (ATIVAN) 1 MG tablet   Oral   Take 1 mg by mouth every 8 (eight) hours.          . montelukast (SINGULAIR) 10 MG tablet      TAKE 1 TABLET ONCE A DAY   30 tablet   0     Pt needs appt with Dr. Lamonte Sakai for further refills.   [name] Kitchen omeprazole (PRILOSEC) 40 MG capsule   Oral   Take 40 mg by mouth daily.           [name] Kitchen PATANOL 0.1 % ophthalmic solution   Both Eyes   Place 1 drop into both eyes daily.          . predniSONE (DELTASONE) 5 MG tablet   Oral   Take 5 mg by mouth daily.           . promethazine (PHENERGAN) 25 MG tablet   Oral   Take 25 mg by mouth every 6 (six) hours as needed. For nausea and sleep         . rosuvastatin (CRESTOR) 20 MG tablet   Oral   Take 1 tablet (20 mg total) by mouth daily.   30 tablet   6   . sertraline (ZOLOFT) 100 MG tablet   Oral   Take 200 mg by mouth at bedtime.         . traMADol (ULTRAM) 50 MG tablet   Oral   Take 50 mg by mouth 3 (three) times daily as needed. For  pain          BP 109/60  Pulse 81  Temp(Src) 98.5 F (36.9 C) (Oral)  Resp 14  SpO2 98% Physical Exam  Constitutional: She is oriented to person, place, and time. She appears well-developed and  well-nourished. No distress.  HENT:  Head: Normocephalic and atraumatic.  Right Ear: External ear normal.  Left Ear: External ear normal.  Nose: Nose normal.  Mouth/Throat: No oropharyngeal exudate.  Eyes: Conjunctivae are normal.  Neck: Neck supple.  Cardiovascular: Normal rate, regular rhythm, normal heart sounds and intact distal pulses.   No murmur heard. Pulmonary/Chest: Effort normal and breath sounds normal. No respiratory distress. She has no rales. She exhibits no tenderness.  Abdominal: Soft. There is no tenderness.  Musculoskeletal: Normal range of motion. She exhibits no edema.  Neurological: She is alert and oriented to person, place, and time.  Skin: Skin is warm and dry. She is not diaphoretic.    ED Course  Procedures (including critical care time) Medications  nitroGLYCERIN (NITROSTAT) SL tablet 0.4 mg (0.4 mg Sublingual Not Given 08/22/13 2135)  sodium chloride 0.9 % bolus 1,000 mL (0 mLs Intravenous Stopped 08/22/13 2325)    Labs Review Labs Reviewed  COMPREHENSIVE METABOLIC PANEL - Abnormal; Notable for the following:    Glucose, Bld 144 (*)    Albumin 3.4 (*)    Alkaline Phosphatase 132 (*)    Total Bilirubin 0.2 (*)    GFR calc non Af Amer 80 (*)    All other components within normal limits  CBC  TROPONIN I  POCT I-STAT TROPONIN I   Imaging Review Dg Chest 2 View  08/23/2013   CLINICAL DATA:  Chest pain  EXAM: CHEST  2 VIEW  COMPARISON:  05/23/2013  FINDINGS: Status post CABG.  Normal heart size.  Coronary arterial stents.  Chronic interstitial coarsening without edema or consolidation. No effusion or pneumothorax. Lap band has an unchanged, normal obliquity.  IMPRESSION: No active cardiopulmonary disease.   Electronically Signed   By: Jorje Guild M.D.   On: 08/23/2013 00:39    EKG Interpretation    Date/Time:  Tuesday August 22 2013 21:17:40 EST Ventricular Rate:  67 PR Interval:  159 QRS Duration: 94 QT Interval:  486 QTC Calculation: 513 R  Axis:   -15 Text Interpretation:  Sinus rhythm Borderline left axis deviation Abnormal R-wave progression, early transition Nonspecific T abnormalities, lateral leads Prolonged QT interval Confirmed by HARRISON  MD, FORREST ([id number]) on 08/22/2013 11:33:02 PM            MDM   1. Chest pain     Afebrile, NAD, non-toxic appearing, AAOx4.   Concern for cardiac etiology of Chest Pain. Hospitalist has been consulted and will see patient in the ED for likely admit for CP r/o. Pt does not meet criteria for CP protocol and a further evaluation is recommended. Pt has been re-evaluated prior to consult and VSS, NAD, heart RRR, pain 0/10, lungs CTAB. T wave abnormalities noted on EKG and first round of cardiac enzymes negative. This case was discussed with Dr. Aline Brochure who has seen the patient and agrees with plan to admit.      [place], PA-C 08/23/13 0111

## 2013-08-22 NOTE — ED Notes (Signed)
Hx of MI in 1994, pt reports pain feels the same.

## 2013-08-22 NOTE — ED Notes (Signed)
PA at bedside.

## 2013-08-23 ENCOUNTER — Encounter (HOSPITAL_COMMUNITY)
Admission: EM | Disposition: A | Discharge status: Home / Self Care | Payer: Self-pay | Source: Home / Self Care | Attending: Internal Medicine

## 2013-08-23 DIAGNOSIS — R079 Chest pain, unspecified: Secondary | ICD-10-CM | POA: Diagnosis present

## 2013-08-23 DIAGNOSIS — I25810 Atherosclerosis of coronary artery bypass graft(s) without angina pectoris: Secondary | ICD-10-CM

## 2013-08-23 DIAGNOSIS — I6529 Occlusion and stenosis of unspecified carotid artery: Secondary | ICD-10-CM | POA: Diagnosis not present

## 2013-08-23 DIAGNOSIS — I214 Non-ST elevation (NSTEMI) myocardial infarction: Secondary | ICD-10-CM | POA: Diagnosis present

## 2013-08-23 DIAGNOSIS — I2510 Atherosclerotic heart disease of native coronary artery without angina pectoris: Secondary | ICD-10-CM | POA: Diagnosis not present

## 2013-08-23 DIAGNOSIS — I200 Unstable angina: Secondary | ICD-10-CM | POA: Diagnosis not present

## 2013-08-23 DIAGNOSIS — Z940 Kidney transplant status: Secondary | ICD-10-CM | POA: Diagnosis not present

## 2013-08-23 HISTORY — PX: PERCUTANEOUS CORONARY STENT INTERVENTION (PCI-S): SHX5485

## 2013-08-23 HISTORY — PX: LEFT HEART CATHETERIZATION WITH CORONARY/GRAFT ANGIOGRAM: SHX5450

## 2013-08-23 LAB — COMPREHENSIVE METABOLIC PANEL
ALT: 16 U/L (ref 0–35)
AST: 27 U/L (ref 0–37)
Albumin: 3.4 g/dL — ABNORMAL LOW (ref 3.5–5.2)
Alkaline Phosphatase: 132 U/L — ABNORMAL HIGH (ref 39–117)
BILIRUBIN TOTAL: 0.2 mg/dL — AB (ref 0.3–1.2)
BUN: 18 mg/dL (ref 6–23)
CALCIUM: 9.5 mg/dL (ref 8.4–10.5)
CHLORIDE: 96 meq/L (ref 96–112)
CO2: 27 meq/L (ref 19–32)
CREATININE: 0.81 mg/dL (ref 0.50–1.10)
GFR calc Af Amer: >90 mL/min (ref >90)
GFR, EST NON AFRICAN AMERICAN: 80 mL/min — AB (ref >90)
Glucose, Bld: 144 mg/dL — ABNORMAL HIGH (ref 70–99)
Potassium: 4.1 mEq/L (ref 3.7–5.3)
Sodium: 138 mEq/L (ref 137–147)
Total Protein: 7.4 g/dL (ref 6.0–8.3)

## 2013-08-23 LAB — POCT I-STAT TROPONIN I: Troponin i, poc: 0.01 ng/mL (ref 0.00–0.08)

## 2013-08-23 LAB — POCT ACTIVATED CLOTTING TIME: ACTIVATED CLOTTING TIME: 570 s

## 2013-08-23 LAB — TROPONIN I: Troponin I: <0.3 ng/mL (ref <0.30)

## 2013-08-23 LAB — MRSA PCR SCREENING: MRSA BY PCR: NEGATIVE

## 2013-08-23 SURGERY — LEFT HEART CATHETERIZATION WITH CORONARY/GRAFT ANGIOGRAM
Anesthesia: LOCAL

## 2013-08-23 MED ORDER — CLOPIDOGREL BISULFATE 300 MG PO TABS
ORAL_TABLET | ORAL | Status: AC
  Filled 2013-08-23: qty 1

## 2013-08-23 MED ORDER — LORAZEPAM 1 MG PO TABS
ORAL_TABLET | ORAL | Status: AC
  Filled 2013-08-23: qty 1

## 2013-08-23 MED ORDER — MIDAZOLAM HCL 2 MG/2ML IJ SOLN
INTRAMUSCULAR | Status: AC
  Filled 2013-08-23: qty 2

## 2013-08-23 MED ORDER — DULOXETINE HCL 30 MG PO CPEP
30.0000 mg | ORAL_CAPSULE | Freq: Every day | ORAL | Status: DC
  Administered 2013-08-23 – 2013-08-24 (×2): 30 mg via ORAL
  Filled 2013-08-23 (×2): qty 1

## 2013-08-23 MED ORDER — HEPARIN (PORCINE) IN NACL 2-0.9 UNIT/ML-% IJ SOLN
INTRAMUSCULAR | Status: AC
  Filled 2013-08-23: qty 1000

## 2013-08-23 MED ORDER — ACETAMINOPHEN 325 MG PO TABS: 650.0000 mg | ORAL_TABLET | ORAL | Status: DC | PRN

## 2013-08-23 MED ORDER — SODIUM CHLORIDE 0.9 % IJ SOLN
3.0000 mL | Freq: Two times a day (BID) | INTRAMUSCULAR | Status: DC
Start: 2013-08-23 — End: 2013-08-23

## 2013-08-23 MED ORDER — ASPIRIN 81 MG PO CHEW: 81.0000 mg | CHEWABLE_TABLET | ORAL | Status: DC

## 2013-08-23 MED ORDER — ATORVASTATIN CALCIUM 40 MG PO TABS
40.0000 mg | ORAL_TABLET | Freq: Every day | ORAL | Status: DC
Start: 2013-08-23 — End: 2013-08-24
  Administered 2013-08-23: 19:00:00 40 mg via ORAL
  Filled 2013-08-23 (×3): qty 1

## 2013-08-23 MED ORDER — BIVALIRUDIN 250 MG IV SOLR
INTRAVENOUS | Status: AC
  Filled 2013-08-23: qty 250

## 2013-08-23 MED ORDER — ASPIRIN EC 81 MG PO TBEC
81.0000 mg | DELAYED_RELEASE_TABLET | Freq: Every day | ORAL | Status: DC
  Administered 2013-08-23 – 2013-08-24 (×2): 81 mg via ORAL
  Filled 2013-08-23 (×2): qty 1

## 2013-08-23 MED ORDER — AZATHIOPRINE 50 MG PO TABS
125.0000 mg | ORAL_TABLET | Freq: Every day | ORAL | Status: DC
  Administered 2013-08-23 – 2013-08-24 (×2): 125 mg via ORAL
  Filled 2013-08-23 (×2): qty 3

## 2013-08-23 MED ORDER — SODIUM CHLORIDE 0.9 % IJ SOLN: 3.0000 mL | INTRAMUSCULAR | Status: DC | PRN

## 2013-08-23 MED ORDER — TRAMADOL HCL 50 MG PO TABS: 50.0000 mg | ORAL_TABLET | Freq: Three times a day (TID) | ORAL | Status: DC | PRN

## 2013-08-23 MED ORDER — CYCLOBENZAPRINE HCL 10 MG PO TABS
10.0000 mg | ORAL_TABLET | Freq: Three times a day (TID) | ORAL | Status: DC
  Administered 2013-08-23 – 2013-08-24 (×4): 10 mg via ORAL
  Filled 2013-08-23 (×9): qty 1

## 2013-08-23 MED ORDER — PREDNISONE 5 MG PO TABS
5.0000 mg | ORAL_TABLET | Freq: Every day | ORAL | Status: DC
  Administered 2013-08-23 – 2013-08-24 (×2): 5 mg via ORAL
  Filled 2013-08-23 (×2): qty 1

## 2013-08-23 MED ORDER — LORAZEPAM 0.5 MG PO TABS
1.0000 mg | ORAL_TABLET | Freq: Three times a day (TID) | ORAL | Status: DC
  Administered 2013-08-23 – 2013-08-24 (×5): 1 mg via ORAL
  Filled 2013-08-23: qty 2
  Filled 2013-08-23: qty 1
  Filled 2013-08-23 (×2): qty 2

## 2013-08-23 MED ORDER — CLOPIDOGREL BISULFATE 75 MG PO TABS
75.0000 mg | ORAL_TABLET | Freq: Every day | ORAL | Status: DC
  Administered 2013-08-23 – 2013-08-24 (×2): 75 mg via ORAL
  Filled 2013-08-23 (×2): qty 1

## 2013-08-23 MED ORDER — PANTOPRAZOLE SODIUM 40 MG PO TBEC
80.0000 mg | DELAYED_RELEASE_TABLET | Freq: Every day | ORAL | Status: DC
  Administered 2013-08-23 – 2013-08-24 (×2): 80 mg via ORAL
  Filled 2013-08-23 (×2): qty 2

## 2013-08-23 MED ORDER — INFLUENZA VAC SPLIT QUAD 0.5 ML IM SUSP
0.5000 mL | INTRAMUSCULAR | Status: DC
  Filled 2013-08-23: qty 0.5

## 2013-08-23 MED ORDER — SODIUM CHLORIDE 0.9 % IV SOLN: 250.0000 mL | INTRAVENOUS | Status: DC | PRN

## 2013-08-23 MED ORDER — OLOPATADINE HCL 0.1 % OP SOLN
1.0000 [drp] | Freq: Every day | OPHTHALMIC | Status: DC
  Administered 2013-08-23: 1 [drp] via OPHTHALMIC
  Filled 2013-08-23: qty 5

## 2013-08-23 MED ORDER — SERTRALINE HCL 50 MG PO TABS
ORAL_TABLET | ORAL | Status: AC
  Filled 2013-08-23: qty 4

## 2013-08-23 MED ORDER — LIDOCAINE HCL (PF) 1 % IJ SOLN
INTRAMUSCULAR | Status: AC
  Filled 2013-08-23: qty 30

## 2013-08-23 MED ORDER — ONDANSETRON HCL 4 MG/2ML IJ SOLN
4.0000 mg | Freq: Four times a day (QID) | INTRAMUSCULAR | Status: DC | PRN
  Administered 2013-08-23: 4 mg via INTRAVENOUS
  Filled 2013-08-23: qty 2

## 2013-08-23 MED ORDER — HYDROCODONE-ACETAMINOPHEN 5-325 MG PO TABS
ORAL_TABLET | ORAL | Status: AC
  Filled 2013-08-23: qty 1

## 2013-08-23 MED ORDER — MONTELUKAST SODIUM 10 MG PO TABS
10.0000 mg | ORAL_TABLET | Freq: Every day | ORAL | Status: DC
  Administered 2013-08-23: 10 mg via ORAL
  Filled 2013-08-23 (×3): qty 1

## 2013-08-23 MED ORDER — HEPARIN SODIUM (PORCINE) 5000 UNIT/ML IJ SOLN
5000.0000 [IU] | Freq: Three times a day (TID) | INTRAMUSCULAR | Status: DC
  Administered 2013-08-23 – 2013-08-24 (×3): 5000 [IU] via SUBCUTANEOUS
  Filled 2013-08-23 (×8): qty 1

## 2013-08-23 MED ORDER — BUPRENORPHINE 20 MCG/HR TD PTWK: 20.0000 ug | MEDICATED_PATCH | TRANSDERMAL | Status: DC

## 2013-08-23 MED ORDER — AMOXICILLIN-POT CLAVULANATE 875-125 MG PO TABS
1.0000 | ORAL_TABLET | Freq: Two times a day (BID) | ORAL | Status: DC
  Administered 2013-08-23 – 2013-08-24 (×3): 1 via ORAL
  Filled 2013-08-23 (×5): qty 1

## 2013-08-23 MED ORDER — SODIUM CHLORIDE 0.9 % IV SOLN
INTRAVENOUS | Status: DC
  Administered 2013-08-23: 11:00:00 via INTRAVENOUS

## 2013-08-23 MED ORDER — SERTRALINE HCL 100 MG PO TABS
200.0000 mg | ORAL_TABLET | Freq: Every day | ORAL | Status: DC
  Administered 2013-08-23 (×2): 200 mg via ORAL
  Filled 2013-08-23 (×3): qty 2

## 2013-08-23 MED ORDER — SODIUM CHLORIDE 0.9 % IV SOLN
INTRAVENOUS | Status: AC
  Administered 2013-08-23: 17:00:00 100 mL/h via INTRAVENOUS

## 2013-08-23 MED ORDER — CALCITRIOL 0.25 MCG PO CAPS
0.2500 ug | ORAL_CAPSULE | Freq: Every day | ORAL | Status: DC
  Administered 2013-08-23 – 2013-08-24 (×2): 0.25 ug via ORAL
  Filled 2013-08-23 (×2): qty 1

## 2013-08-23 MED ORDER — HYDROCODONE-ACETAMINOPHEN 5-325 MG PO TABS
1.0000 | ORAL_TABLET | Freq: Two times a day (BID) | ORAL | Status: DC
  Administered 2013-08-23 – 2013-08-24 (×4): 1 via ORAL
  Filled 2013-08-23 (×3): qty 1

## 2013-08-23 MED ORDER — PROMETHAZINE HCL 25 MG PO TABS: 25.0000 mg | ORAL_TABLET | Freq: Four times a day (QID) | ORAL | Status: DC | PRN

## 2013-08-23 MED ORDER — NITROGLYCERIN 0.2 MG/ML ON CALL CATH LAB
INTRAVENOUS | Status: AC
  Filled 2013-08-23: qty 1

## 2013-08-23 MED ORDER — HEPARIN SODIUM (PORCINE) 5000 UNIT/ML IJ SOLN
INTRAMUSCULAR | Status: AC
  Filled 2013-08-23: qty 1

## 2013-08-23 MED ORDER — FENTANYL CITRATE 0.05 MG/ML IJ SOLN
INTRAMUSCULAR | Status: AC
  Filled 2013-08-23: qty 2

## 2013-08-23 NOTE — ED Notes (Signed)
Report given to [facility] unit nurse , transported by NT in stable condition , no pain at time of transport / respirations unlabored/ iv site unremarkable / personal belongings bagged with pt.

## 2013-08-23 NOTE — CV Procedure (Signed)
   Cardiac Catheterization Procedure Note  Name: Katherine [name] MRN: [id number] DOB: [birth year]  Procedure: Left Heart Cath, Selective Coronary Angiography, SVG/LIMA angiography,  PTCA/Stent of SVG to diagonal  Indication: unstable angina with known history of CAD s/p remote CABG with multiple PCIs on SVG to diagonal.    Medications:  Sedation:  6 mg IV Versed, 25 mcg IV Fentanyl  Contrast:  110 ml Omnipaque  Diagnostic Procedure Details: The right groin was prepped, draped, and anesthetized with 1% lidocaine. Using the modified Seldinger technique, a 5 French sheath was introduced into the right femoral artery. Standard Judkins catheters were used for selective coronary angiography and left ventriculography. A long exchange wire and a LIMA catheter was used to engage the IM.  Catheter exchanges were performed over a wire.  The diagnostic procedure was well-tolerated without immediate complications.  Procedural Findings:  Hemodynamics: AO:  129/70   mmHg LV:  128/7    mmHg LVEDP: 15  mmHg  Coronary angiography: Coronary dominance: right   Left Main:  Normal  Left Anterior Descending (LAD):  Occluded proximally.   Circumflex (LCx):  Normal in size with 40% mid stenosis.   1st obtuse marginal:  Large in size with 60% proximal stenosis.   2nd obtuse marginal:  Small is size with 50% ostial stneosis  3rd obtuse marginal:  Normal is size with no significant disease.    Right Coronary Artery: occluded proximally.   SVG to diagonal: patent with 80% stenosis in mid segment with a filling defect (could be thrombus). Stents are noted distal with 50% instent restenosis.   SVG to RPDA; patent with no significant disease.   LIMA to LAD: patent with 20% ostial stenosis. Otherwise, no significant disease.   Left ventriculography: was not performed.   PCI Procedure Note:  Following the diagnostic procedure, the decision was made to proceed with PCI. The sheath was upsized to a 6  [place]. Weight-based bivalirudin was given for anticoagulation. Once a therapeutic ACT was achieved, a 6 [place] AL1 guide catheter was inserted. A protection device could not be used due to no distal landing zone.  A Runthrough coronary guidewire was used to cross the lesion.    The lesion was then stented with a 2.75 X 20 mm Promus  Stent to 14 atm.  Following PCI, there was 0% residual stenosis and TIMI-3 flow. There was mild dye streaming which improved with NTG . The patient tolerated the PCI procedure well. There were no immediate procedural complications.  The patient was transferred to the post catheterization recovery area for further monitoring. Femoral angiography showed severe calcified disease in right common femoral artery.   PCI Data: Vessel - SVG to diagonal/Segment - mid Percent Stenosis (pre)  80% TIMI-flow 3 Stent 2.75 X20 mm Promus DES Percent Stenosis (post) 0% TIMI-flow (post) 3  Final Conclusions:  1. Severe 2 vessel CAD with patent LIMA to LAD, SVG to RPDA and SVG to diagonal. However, there is 80% stenosis in SVG to diagonal proximal to previously placed stents.  2. Mildly elevated LVEDP.  3. Severe calcified disease in right common femoral artery.  4. Successful drug eluting stent placement to SVG to diagonal.   Recommendations:  Continue lifelong dual antiplatelet therapy. Hydrate overnight. Recommend an ABI and lower extremity arterial duplex which can be done as an outpatient. Kathlyn Sacramento MD, [facility] 08/23/2013, 4:20 PM

## 2013-08-23 NOTE — Progress Notes (Signed)
Site area: right groin  Site Prior to Removal:  Level 0  Pressure Applied For 20 MINUTES    Minutes Beginning at [place]  Manual:   yes  Patient Status During Pull:  AAO X4  Post Pull Groin Site:  Level 0  Post Pull Instructions Given:  yes  Post Pull Pulses Present:  yes  Dressing Applied:  yes  Comments:  Tolerated procedure well

## 2013-08-23 NOTE — ED Provider Notes (Signed)
Medical screening examination/treatment/procedure(s) were performed by non-physician practitioner and as supervising physician I was immediately available for consultation/collaboration.  EKG Interpretation    Date/Time:  Tuesday August 22 2013 21:17:40 EST Ventricular Rate:  67 PR Interval:  159 QRS Duration: 94 QT Interval:  486 QTC Calculation: 513 R Axis:   -15 Text Interpretation:  Sinus rhythm Borderline left axis deviation Abnormal R-wave progression, early transition Nonspecific T abnormalities, lateral leads Prolonged QT interval Confirmed by Aline Brochure  MD, Aliani Caccavale ([id number]) on 08/22/2013 11:33:02 PM              Shea Evans Ruthe Mannan, MD 08/23/13 1507

## 2013-08-23 NOTE — Progress Notes (Signed)
Dr. Deterding's (Renal) office called and stated that patient needed 4 to 6 hours of good hydration prior to cath, Dr. Johnsie Cancel notified of this, new order received for NS at 150/hour.  IVF started at 1106.

## 2013-08-23 NOTE — Consult Note (Signed)
CARDIOLOGY CONSULT NOTE       Patient ID: Katherine [name] MRN: [id number] DOB/AGE: 57/08/1955 57 y.o.  Admit date: 08/22/2013 Referring Physician:  Alcario Drought Primary Physician: No PCP Per Patient Primary Cardiologist:  Jacquelin Hawking Reason for Consultation:  Chest Pain  Principal Problem:   Chest pain Active Problems:   CAD (coronary artery disease)   HPI:   58 yo history of large MI in 69 and CABG in 95.  Records not in Epic  Has f/u with [facility] over a year ago ok.  Had stents to SVG by Dr Marolyn Haller 2004  She awoke this am with Severe SSCP.  Pain central with radiation to shoulders  Lasted over 20 minutes Eventually relieved with SL nitro x 3 and aspirin  In hospital no acute ECG changes.  POC troponin negative.  Currently pain Free.  No pleuritic component no palpitations dyspnea or syncope.  She does not have a primary care doctor and sees all specialists.  She has a history of mild CRF and sees [facility]  Cr ok  Previous lapband but pain in  Chest was reminiscent of angina and not GERD   She has had a cold / congestion since Thanksgiving no fever started on augmentin in hospital    ROS All other systems reviewed and negative except as noted above  Past Medical History  Diagnosis Date  . Chronic kidney disease   . Heart attack   . Hypertension   . Allergy   . Fibromyalgia   . Boils     lft and rt thigh, lft groin  . MRSA (methicillin resistant Staphylococcus aureus)   . Anxiety   . Depression   . Vomiting   . Poor appetite   . CAD (coronary artery disease) 10/21/0,48/27/03    restenting distal anastomotic site with drug eluting Taxus stent 90-10%;  [name] Kitchen S/P CABG (coronary artery bypass graft) 1995  . COPD (chronic obstructive pulmonary disease)   . OSA (obstructive sleep apnea)   . Dyslipidemia   . 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 Carotid stenosis  07/29/2009    right &left BULBS & ICA's plaque 0-49%    Family History  Problem Relation Age of Onset  . Cancer Mother     liver  . Heart disease Father   . Cancer Father     colon    History   Social History  . Marital Status: Married    Spouse Name: N/A    Number of Children: N/A  . Years of Education: N/A   Occupational History  . Not on file.   Social History Main Topics  . Smoking status: Former Smoker -- 1.00 packs/day for 30 years    Types: Cigarettes    Quit date: 08/17/2002  . Smokeless tobacco: Never Used  . Alcohol Use: No  . Drug Use: No  . Sexual Activity: Not on file   Other Topics Concern  . Not on file   Social History Narrative  . No narrative on file    Past Surgical History  Procedure Laterality Date  . Laparoscopic gastric banding  04/2004  . Catheter removal    . Coronary artery bypass graft  1995  . Kidney transplant    . Wrist fistula repair    . Tubal ligation    . Cardiac catheterization  06/07/2003    successful restenting of distal anastomotic site with drug  eluting Taxus stent 90-10%;2001 SVG stenting diagonal;;10/28/00 tandem stenting SVG diagonal and tandem stenting 04/12/02  . Port-a-cath removal      kidney  . Incise and drain abcess       . amoxicillin-clavulanate  1 tablet Oral BID  . aspirin EC  81 mg Oral Daily  . atorvastatin  40 mg Oral q1800  . azaTHIOprine  125 mg Oral Daily  . buprenorphine  20 mcg Transdermal Weekly  . calcitRIOL  0.25 mcg Oral Daily  . clopidogrel  75 mg Oral Q breakfast  . cyclobenzaprine  10 mg Oral TID  . DULoxetine  30 mg Oral Daily  . heparin      . heparin  5,000 Units Subcutaneous Q8H  . HYDROcodone-acetaminophen  1 tablet Oral BID  . HYDROcodone-acetaminophen      . [START ON 08/24/2013] influenza vac split quadrivalent PF  0.5 mL Intramuscular Tomorrow-1000  . LORazepam      . LORazepam  1 mg Oral Q8H  . montelukast  10 mg Oral QHS  . olopatadine  1 drop Both Eyes Daily  .  pantoprazole  80 mg Oral Daily  . predniSONE  5 mg Oral Daily  . sertraline      . sertraline  200 mg Oral QHS      Physical Exam: Blood pressure 116/61, pulse 59, temperature 97.2 F (36.2 C), temperature source Oral, resp. rate 18, height 5' (1.524 m), weight 188 lb 14.4 oz (85.684 kg), SpO2 99.00%.   Affect appropriate Healthy:  appears stated age 58: normal Neck supple with no adenopathy JVP normal no bruits no thyromegaly Lungs clear with no wheezing and good diaphragmatic motion Heart:  S1/S2 SEM murmur, no rub, gallop or click PMI normal Abdomen: benighn, BS positve, no tenderness, no AAA no bruit.  No HSM or HJR Distal pulses intact with no bruits No edema Neuro non-focal Skin warm and dry No muscular weakness   Labs:   Lab Results  Component Value Date   WBC 8.2 08/22/2013   HGB 12.0 08/22/2013   HCT 37.4 08/22/2013   MCV 90.3 08/22/2013   PLT 249 08/22/2013    Recent Labs Lab 08/22/13 2126  NA 138  K 4.1  CL 96  CO2 27  BUN 18  CREATININE 0.81  CALCIUM 9.5  PROT 7.4  BILITOT 0.2*  ALKPHOS 132*  ALT 16  AST 27  GLUCOSE 144*   Lab Results  Component Value Date   CKTOTAL 56 09/18/2010   CKMB 2.5 09/18/2010   TROPONINI <0.30 08/23/2013       Radiology: Dg Chest 2 View  08/23/2013   CLINICAL DATA:  Chest pain  EXAM: CHEST  2 VIEW  COMPARISON:  05/23/2013  FINDINGS: Status post CABG.  Normal heart size.  Coronary arterial stents.  Chronic interstitial coarsening without edema or consolidation. No effusion or pneumothorax. Lap band has an unchanged, normal obliquity.  IMPRESSION: No active cardiopulmonary disease.   Electronically Signed   By: Jorje Guild M.D.   On: 08/23/2013 00:39    EKG:  SR old IMI poor R wave progression no acute chagnes   ASSESSMENT AND PLAN:  Chest Pain:  Similar to previous angina  R/O no acute changes.  Grafts are 57 years old and she has had stenting of the ? Diagonal graft already in past  [place] cath To further  risk stratify.  She agrees  She already has an outpatient f/u with [name] for latter this month  Continue  plavix  If intervention needed may check P2Y Chol:  Continue statin Sinus:  Day 1 augmentin per primary service  She has not had flu shot and will get in hospital    Signed: Jenkins Rouge 08/23/2013, 10:09 AM

## 2013-08-23 NOTE — Discharge Summary (Signed)
Physician Discharge Summary  Katherine [name] [id number] DOB: [birth year] DOA: 08/22/2013  PCP: No PCP Per Patient  Admit date: 08/22/2013 Discharge date: 08/23/2013  Time spent: >45 minutes  NOTE: preliminary d/c summary- medication list will be updated upon discharge   Discharge Diagnoses:  Principal Problem:   Chest pain Active Problems:   CAD (coronary artery disease)   Discharge Condition: Stable  Diet recommendation: heart healthy  Filed Weights   08/23/13 0259  Weight: 85.684 kg (188 lb 14.4 oz)    History of present illness:  Katherine [name] is a 58 y.o. female who presents to the ED for chest pain. Symptoms onset at 7:30 pm this evening, lasting 15 mins before being resolved after 4 baby ASA and NTG. She states her central chest pain with radiation to both arms is identical to how her last MI presented back in 1994. Negative echo in Sept 2014 and also states that she had a negative stress test with [facility] within the last year (although I cant find this in our computer system). She states she hasnt had any cardiac problems since the CABG or caths since that time    Hospital Course:  Chest pain - Troponins were not noted to be elevated however, as this pain was similar to her previous when she had an MI, it was decided to perform a cardiac cath.  - Cath report:  Severe 2 vessel CAD with patent LIMA to LAD, SVG to RPDA and SVG to diagonal. However, there is 80% stenosis in SVG to diagonal proximal to previously placed stents.  2. Mildly elevated LVEDP.  3. Severe calcified disease in right common femoral artery.  4. Successful drug eluting stent placement to SVG to diagonal.   - cont life long dual antiplatelet therapy - recommended to have ABI and lower extremity arterial duplex as outpt.   Anxiety/ Depression - stable on current meds   Consultations:  Cardiology  Discharge Exam: Filed Vitals:   08/23/13 1700  BP: 164/64  Pulse: 70  Temp:   Resp:      General: AAO x 3 Cardiovascular: RRR, no murmurs Respiratory: CTA b/l   Discharge Instructions   Future Appointments Provider Department Dept Phone   09/11/2013 8:45 AM [name], MD [facility] [phone]       Medication List    ASK your doctor about these medications       aspirin EC 81 MG tablet  Take 81 mg by mouth daily.     azaTHIOprine 50 MG tablet  Commonly known as:  IMURAN  Take 125 mg by mouth daily.     BUTRANS 20 MCG/HR Ptwk patch  Generic drug:  buprenorphine  Place 20 mcg onto the skin once a week.     calcitRIOL 0.25 MCG capsule  Commonly known as:  ROCALTROL  Take 0.25 mcg by mouth daily.     CITRACAL + D PO  Take by mouth daily.     clopidogrel 75 MG tablet  Commonly known as:  PLAVIX  TAKE 1 TABLET BY MOUTH EVERY DAY     cyclobenzaprine 10 MG tablet  Commonly known as:  FLEXERIL  Take 10 mg by mouth 3 (three) times daily.     DULoxetine 30 MG capsule  Commonly known as:  CYMBALTA  Take 30 mg by mouth daily.     fluticasone 50 MCG/ACT nasal spray  Commonly known as:  FLONASE  PLACE 2 SPRAYS INTO THE NOSE DAILY.  HYDROcodone-acetaminophen 5-325 MG per tablet  Commonly known as:  NORCO/VICODIN  Take 1 tablet by mouth 2 (two) times daily. Take every day per patient     LORazepam 1 MG tablet  Commonly known as:  ATIVAN  Take 1 mg by mouth every 8 (eight) hours.     montelukast 10 MG tablet  Commonly known as:  SINGULAIR  TAKE 1 TABLET ONCE A DAY     omeprazole 40 MG capsule  Commonly known as:  PRILOSEC  Take 40 mg by mouth daily.     PATANOL 0.1 % ophthalmic solution  Generic drug:  olopatadine  Place 1 drop into both eyes daily.     predniSONE 5 MG tablet  Commonly known as:  DELTASONE  Take 5 mg by mouth daily.     promethazine 25 MG tablet  Commonly known as:  PHENERGAN  Take 25 mg by mouth every 6 (six) hours as needed. For nausea and sleep     rosuvastatin 20 MG tablet  Commonly known as:   CRESTOR  Take 1 tablet (20 mg total) by mouth daily.     sertraline 100 MG tablet  Commonly known as:  ZOLOFT  Take 200 mg by mouth at bedtime.     traMADol 50 MG tablet  Commonly known as:  ULTRAM  Take 50 mg by mouth 3 (three) times daily as needed. For pain       Allergies  Allergen Reactions  . Codeine Nausea And Vomiting  . Erythromycin Nausea And Vomiting  . Hydromorphone Hcl Nausea And Vomiting  . Nalbuphine Nausea And Vomiting  . Niaspan [Niacin Er] Other (See Comments)    unknown  . Sulfonamide Derivatives Other (See Comments)    Reaction: per patient "tears her stomach up"  . Tetracycline Hives      The results of significant diagnostics from this hospitalization (including imaging, microbiology, ancillary and laboratory) are listed below for reference.    Significant Diagnostic Studies: Dg Chest 2 View  08/23/2013   CLINICAL DATA:  Chest pain  EXAM: CHEST  2 VIEW  COMPARISON:  05/23/2013  FINDINGS: Status post CABG.  Normal heart size.  Coronary arterial stents.  Chronic interstitial coarsening without edema or consolidation. No effusion or pneumothorax. Lap band has an unchanged, normal obliquity.  IMPRESSION: No active cardiopulmonary disease.   Electronically Signed   By: Jorje Guild M.D.   On: 08/23/2013 00:39    Microbiology: Recent Results (from the past 240 hour(s))  MRSA PCR SCREENING     Status: None   Collection Time    08/23/13  3:22 AM      Result Value Range Status   MRSA by PCR NEGATIVE  NEGATIVE Final   Comment:            The GeneXpert MRSA Assay (FDA     approved for NASAL specimens     only), is one component of a     comprehensive MRSA colonization     surveillance program. It is not     intended to diagnose MRSA     infection nor to guide or     monitor treatment for     MRSA infections.     Labs: Basic Metabolic Panel:  Recent Labs Lab 08/22/13 2126  NA 138  K 4.1  CL 96  CO2 27  GLUCOSE 144*  BUN 18  CREATININE 0.81   CALCIUM 9.5   Liver Function Tests:  Recent Labs Lab 08/22/13 2126  AST  27  ALT 16  ALKPHOS 132*  BILITOT 0.2*  PROT 7.4  ALBUMIN 3.4*   No results found for this basename: LIPASE, AMYLASE,  in the last 168 hours No results found for this basename: AMMONIA,  in the last 168 hours CBC:  Recent Labs Lab 08/22/13 2125  WBC 8.2  HGB 12.0  HCT 37.4  MCV 90.3  PLT 249   Cardiac Enzymes:  Recent Labs Lab 08/22/13 2125 08/23/13 0655 08/23/13 1130  TROPONINI <0.30 <0.30 <0.30   BNP: BNP (last 3 results) No results found for this basename: PROBNP,  in the last 8760 hours CBG: No results found for this basename: GLUCAP,  in the last 168 hours     Signed:  [facility] 08/23/2013, 6:13 PM

## 2013-08-23 NOTE — Progress Notes (Signed)
UR Completed Keller Mikels Graves-Bigelow, RN,BSN [phone]  

## 2013-08-23 NOTE — H&P (View-Only) (Signed)
CARDIOLOGY CONSULT NOTE       Patient ID: Katherine [name] MRN: [id number] DOB/AGE: Oct 15, 1955 58 y.o.  Admit date: 08/22/2013 Referring Physician:  Alcario Drought Primary Physician: No PCP Per Patient Primary Cardiologist:  Jacquelin Hawking Reason for Consultation:  Chest Pain  Principal Problem:   Chest pain Active Problems:   CAD (coronary artery disease)   HPI:   58 yo history of large MI in 85 and CABG in 95.  Records not in Epic  Has f/u with [facility] over a year ago ok.  Had stents to SVG by Dr Marolyn Haller 2004  She awoke this am with Severe SSCP.  Pain central with radiation to shoulders  Lasted over 20 minutes Eventually relieved with SL nitro x 3 and aspirin  In hospital no acute ECG changes.  POC troponin negative.  Currently pain Free.  No pleuritic component no palpitations dyspnea or syncope.  She does not have a primary care doctor and sees all specialists.  She has a history of mild CRF and sees [facility]  Cr ok  Previous lapband but pain in  Chest was reminiscent of angina and not GERD   She has had a cold / congestion since Thanksgiving no fever started on augmentin in hospital    ROS All other systems reviewed and negative except as noted above  Past Medical History  Diagnosis Date  . Chronic kidney disease   . Heart attack   . Hypertension   . Allergy   . Fibromyalgia   . Boils     lft and rt thigh, lft groin  . MRSA (methicillin resistant Staphylococcus aureus)   . Anxiety   . Depression   . Vomiting   . Poor appetite   . CAD (coronary artery disease) 10/21/0,48/27/03    restenting distal anastomotic site with drug eluting Taxus stent 90-10%;  [name] Kitchen S/P CABG (coronary artery bypass graft) 1995  . COPD (chronic obstructive pulmonary disease)   . OSA (obstructive sleep apnea)   . Dyslipidemia   . 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 Carotid stenosis  07/29/2009    right &left BULBS & ICA's plaque 0-49%    Family History  Problem Relation Age of Onset  . Cancer Mother     liver  . Heart disease Father   . Cancer Father     colon    History   Social History  . Marital Status: Married    Spouse Name: N/A    Number of Children: N/A  . Years of Education: N/A   Occupational History  . Not on file.   Social History Main Topics  . Smoking status: Former Smoker -- 1.00 packs/day for 30 years    Types: Cigarettes    Quit date: 08/17/2002  . Smokeless tobacco: Never Used  . Alcohol Use: No  . Drug Use: No  . Sexual Activity: Not on file   Other Topics Concern  . Not on file   Social History Narrative  . No narrative on file    Past Surgical History  Procedure Laterality Date  . Laparoscopic gastric banding  04/2004  . Catheter removal    . Coronary artery bypass graft  1995  . Kidney transplant    . Wrist fistula repair    . Tubal ligation    . Cardiac catheterization  06/07/2003    successful restenting of distal anastomotic site with drug  eluting Taxus stent 90-10%;2001 SVG stenting diagonal;;10/28/00 tandem stenting SVG diagonal and tandem stenting 04/12/02  . Port-a-cath removal      kidney  . Incise and drain abcess       . amoxicillin-clavulanate  1 tablet Oral BID  . aspirin EC  81 mg Oral Daily  . atorvastatin  40 mg Oral q1800  . azaTHIOprine  125 mg Oral Daily  . buprenorphine  20 mcg Transdermal Weekly  . calcitRIOL  0.25 mcg Oral Daily  . clopidogrel  75 mg Oral Q breakfast  . cyclobenzaprine  10 mg Oral TID  . DULoxetine  30 mg Oral Daily  . heparin      . heparin  5,000 Units Subcutaneous Q8H  . HYDROcodone-acetaminophen  1 tablet Oral BID  . HYDROcodone-acetaminophen      . [START ON 08/24/2013] influenza vac split quadrivalent PF  0.5 mL Intramuscular Tomorrow-1000  . LORazepam      . LORazepam  1 mg Oral Q8H  . montelukast  10 mg Oral QHS  . olopatadine  1 drop Both Eyes Daily  .  pantoprazole  80 mg Oral Daily  . predniSONE  5 mg Oral Daily  . sertraline      . sertraline  200 mg Oral QHS      Physical Exam: Blood pressure 116/61, pulse 59, temperature 97.2 F (36.2 C), temperature source Oral, resp. rate 18, height 5' (1.524 m), weight 188 lb 14.4 oz (85.684 kg), SpO2 99.00%.   Affect appropriate Healthy:  appears stated age 32: normal Neck supple with no adenopathy JVP normal no bruits no thyromegaly Lungs clear with no wheezing and good diaphragmatic motion Heart:  S1/S2 SEM murmur, no rub, gallop or click PMI normal Abdomen: benighn, BS positve, no tenderness, no AAA no bruit.  No HSM or HJR Distal pulses intact with no bruits No edema Neuro non-focal Skin warm and dry No muscular weakness   Labs:   Lab Results  Component Value Date   WBC 8.2 08/22/2013   HGB 12.0 08/22/2013   HCT 37.4 08/22/2013   MCV 90.3 08/22/2013   PLT 249 08/22/2013    Recent Labs Lab 08/22/13 2126  NA 138  K 4.1  CL 96  CO2 27  BUN 18  CREATININE 0.81  CALCIUM 9.5  PROT 7.4  BILITOT 0.2*  ALKPHOS 132*  ALT 16  AST 27  GLUCOSE 144*   Lab Results  Component Value Date   CKTOTAL 56 09/18/2010   CKMB 2.5 09/18/2010   TROPONINI <0.30 08/23/2013       Radiology: Dg Chest 2 View  08/23/2013   CLINICAL DATA:  Chest pain  EXAM: CHEST  2 VIEW  COMPARISON:  05/23/2013  FINDINGS: Status post CABG.  Normal heart size.  Coronary arterial stents.  Chronic interstitial coarsening without edema or consolidation. No effusion or pneumothorax. Lap band has an unchanged, normal obliquity.  IMPRESSION: No active cardiopulmonary disease.   Electronically Signed   By: Jorje Guild M.D.   On: 08/23/2013 00:39    EKG:  SR old IMI poor R wave progression no acute chagnes   ASSESSMENT AND PLAN:  Chest Pain:  Similar to previous angina  R/O no acute changes.  Grafts are 57 years old and she has had stenting of the ? Diagonal graft already in past  [facility] cath To further  risk stratify.  She agrees  She already has an outpatient f/u with [name] for latter this month  Continue  plavix  If intervention needed may check P2Y Chol:  Continue statin Sinus:  Day 1 augmentin per primary service  She has not had flu shot and will get in hospital    Signed: Jenkins Rouge 08/23/2013, 10:09 AM

## 2013-08-23 NOTE — H&P (Signed)
[facility] History and Physical  Katherine [name] [id number] DOB: September 22, 1955 DOA: 08/22/2013  Referring physician: EDP PCP: No PCP Per Patient   Chief Complaint: Chest Pain   HPI: Katherine [name] is a 58 y.o. female who presents to the ED for chest pain.  Symptoms onset at 7:30 pm this evening, lasting 15 mins before being resolved after 4 baby ASA and NTG.  She states her central chest pain with radiation to both arms is identical to how her last MI presented back in 1994.  Negative echo in Sept 2014 and also states that she had a negative stress test with [facility] within the last year (although I cant find this in our computer system).  She states she hasnt had any cardiac problems since the CABG or caths since that time (although review of the records does suggest stent placements in 01, 02, and 03).  Review of Systems: Systems reviewed.  As above, otherwise negative  Past Medical History  Diagnosis Date  . Chronic kidney disease   . Heart attack   . Hypertension   . Allergy   . Fibromyalgia   . Boils     lft and rt thigh, lft groin  . MRSA (methicillin resistant Staphylococcus aureus)   . Anxiety   . Depression   . Vomiting   . Poor appetite   . CAD (coronary artery disease) 10/21/0,48/27/03    restenting distal anastomotic site with drug eluting Taxus stent 90-10%;  [name] Kitchen S/P CABG (coronary artery bypass graft) 1995  . COPD (chronic obstructive pulmonary disease)   . OSA (obstructive sleep apnea)   . Dyslipidemia   . 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 Carotid stenosis 07/29/2009    right &left BULBS & ICA's plaque 0-49%   Past Surgical History  Procedure Laterality Date  . Laparoscopic gastric banding  04/2004  . Catheter removal    . Coronary artery bypass graft  1995  . Kidney transplant    . Wrist fistula repair    . Tubal ligation    . Cardiac catheterization   06/07/2003    successful restenting of distal anastomotic site with drug eluting Taxus stent 90-10%;2001 SVG stenting diagonal;;10/28/00 tandem stenting SVG diagonal and tandem stenting 04/12/02  . Port-a-cath removal      kidney  . Incise and drain abcess     Social History:  reports that she quit smoking about 11 years ago. Her smoking use included Cigarettes. She has a 30 pack-year smoking history. She has never used smokeless tobacco. She reports that she does not drink alcohol or use illicit drugs.  Allergies  Allergen Reactions  . Codeine Nausea And Vomiting  . Erythromycin Nausea And Vomiting  . Hydromorphone Hcl Nausea And Vomiting  . Nalbuphine Nausea And Vomiting  . Niaspan [Niacin Er] Other (See Comments)    unknown  . Sulfonamide Derivatives Other (See Comments)    Reaction: per patient "tears her stomach up"  . Tetracycline Hives    Family History  Problem Relation Age of Onset  . Cancer Mother     liver  . Heart disease Father   . Cancer Father     colon     Prior to Admission medications   Medication Sig Start Date End Date Taking? Authorizing Provider  aspirin EC 81 MG tablet Take 81 mg by mouth daily.   Yes Historical Provider, MD  azaTHIOprine (IMURAN) 50  MG tablet Take 125 mg by mouth daily.   Yes Historical Provider, MD  buprenorphine (BUTRANS) 20 MCG/HR PTWK Place 20 mcg onto the skin once a week.    Yes Historical Provider, MD  calcitRIOL (ROCALTROL) 0.25 MCG capsule Take 0.25 mcg by mouth daily.   Yes Historical Provider, MD  Calcium Citrate-Vitamin D (CITRACAL + D PO) Take by mouth daily.   Yes Historical Provider, MD  clopidogrel (PLAVIX) 75 MG tablet TAKE 1 TABLET BY MOUTH EVERY DAY 08/11/13  Yes [name], MD  cyclobenzaprine (FLEXERIL) 10 MG tablet Take 10 mg by mouth 3 (three) times daily.    Yes Historical Provider, MD  DULoxetine (CYMBALTA) 30 MG capsule Take 30 mg by mouth daily.     Yes Historical Provider, MD  fluticasone (FLONASE) 50  MCG/ACT nasal spray PLACE 2 SPRAYS INTO THE NOSE DAILY. 07/31/13  Yes Collene Gobble, MD  HYDROcodone-acetaminophen (NORCO/VICODIN) 5-325 MG per tablet Take 1 tablet by mouth 2 (two) times daily. Take every day per patient   Yes Historical Provider, MD  LORazepam (ATIVAN) 1 MG tablet Take 1 mg by mouth every 8 (eight) hours.    Yes Historical Provider, MD  montelukast (SINGULAIR) 10 MG tablet TAKE 1 TABLET ONCE A DAY 08/11/13  Yes Collene Gobble, MD  omeprazole (PRILOSEC) 40 MG capsule Take 40 mg by mouth daily.     Yes Historical Provider, MD  PATANOL 0.1 % ophthalmic solution Place 1 drop into both eyes daily.  05/12/11  Yes Historical Provider, MD  predniSONE (DELTASONE) 5 MG tablet Take 5 mg by mouth daily.     Yes Historical Provider, MD  promethazine (PHENERGAN) 25 MG tablet Take 25 mg by mouth every 6 (six) hours as needed. For nausea and sleep   Yes Historical Provider, MD  rosuvastatin (CRESTOR) 20 MG tablet Take 1 tablet (20 mg total) by mouth daily. 04/03/13  Yes Rebecca Eaton, MD  sertraline (ZOLOFT) 100 MG tablet Take 200 mg by mouth at bedtime.   Yes Historical Provider, MD  traMADol (ULTRAM) 50 MG tablet Take 50 mg by mouth 3 (three) times daily as needed. For pain   Yes Historical Provider, MD   Physical Exam: Filed Vitals:   08/23/13 0018  BP: 109/60  Pulse: 81  Temp:   Resp: 14    BP 109/60  Pulse 81  Temp(Src) 98.5 F (36.9 C) (Oral)  Resp 14  SpO2 98%  General Appearance:    Alert, oriented, no distress, appears stated age  Head:    Normocephalic, atraumatic  Eyes:    PERRL, EOMI, sclera non-icteric        Nose:   Nares without drainage or epistaxis. Mucosa, turbinates normal  Throat:   Moist mucous membranes. Oropharynx without erythema or exudate.  Neck:   Supple. No carotid bruits.  No thyromegaly.  No lymphadenopathy.   Back:     No CVA tenderness, no spinal tenderness  Lungs:     Clear to auscultation bilaterally, without wheezes, rhonchi or rales   Chest wall:    No tenderness to palpitation  Heart:    Regular rate and rhythm without murmurs, gallops, rubs  Abdomen:     Soft, non-tender, nondistended, normal bowel sounds, no organomegaly  Genitalia:    deferred  Rectal:    deferred  Extremities:   No clubbing, cyanosis or edema.  Pulses:   2+ and symmetric all extremities  Skin:   Skin color, texture, turgor normal, no rashes  or lesions  Lymph nodes:   Cervical, supraclavicular, and axillary nodes normal  Neurologic:   CNII-XII intact. Normal strength, sensation and reflexes      throughout    Labs on Admission:  Basic Metabolic Panel:  Recent Labs Lab 08/22/13 2126  NA 138  K 4.1  CL 96  CO2 27  GLUCOSE 144*  BUN 18  CREATININE 0.81  CALCIUM 9.5   Liver Function Tests:  Recent Labs Lab 08/22/13 2126  AST 27  ALT 16  ALKPHOS 132*  BILITOT 0.2*  PROT 7.4  ALBUMIN 3.4*   No results found for this basename: LIPASE, AMYLASE,  in the last 168 hours No results found for this basename: AMMONIA,  in the last 168 hours CBC:  Recent Labs Lab 08/22/13 2125  WBC 8.2  HGB 12.0  HCT 37.4  MCV 90.3  PLT 249   Cardiac Enzymes:  Recent Labs Lab 08/22/13 2125  TROPONINI <0.30    BNP (last 3 results) No results found for this basename: PROBNP,  in the last 8760 hours CBG: No results found for this basename: GLUCAP,  in the last 168 hours  Radiological Exams on Admission: Dg Chest 2 View  08/23/2013   CLINICAL DATA:  Chest pain  EXAM: CHEST  2 VIEW  COMPARISON:  05/23/2013  FINDINGS: Status post CABG.  Normal heart size.  Coronary arterial stents.  Chronic interstitial coarsening without edema or consolidation. No effusion or pneumothorax. Lap band has an unchanged, normal obliquity.  IMPRESSION: No active cardiopulmonary disease.   Electronically Signed   By: Jorje Guild M.D.   On: 08/23/2013 00:39    EKG: Independently reviewed.  Assessment/Plan Principal Problem:   Chest pain Active Problems:    CAD (coronary artery disease)   1. Chest pain with h/o CAD - chest pain obs orders, NPO after midnight, needs cards eval in AM for either stress test vs heart cath given extensive history of CAD in the past (CABG 20 years ago, stents in 01, 02, and 03).    Code Status: Full Code  Family Communication: No family in room Disposition Plan: Admit to inpatient   Time spent: 30 min  GARDNER, JARED M. [facility] Pager [phone]  If 7AM-7PM, please contact the day team taking care of the patient [email or web address] Password [email or web address] 08/23/2013, 1:31 AM

## 2013-08-23 NOTE — Interval H&P Note (Signed)
Cath Lab Visit (complete for each Cath Lab visit)  Clinical Evaluation Leading to the Procedure:   ACS: yes  Non-ACS:    Anginal Classification: CCS IV  Anti-ischemic medical therapy: Maximal Therapy (2 or more classes of medications)  Non-Invasive Test Results: No non-invasive testing performed  Prior CABG: Previous CABG      History and Physical Interval Note:  08/23/2013 3:01 PM  Katherine [name]  has presented today for surgery, with the diagnosis of cp  The various methods of treatment have been discussed with the patient and family. After consideration of risks, benefits and other options for treatment, the patient has consented to  Procedure(s): LEFT HEART CATHETERIZATION WITH CORONARY/GRAFT ANGIOGRAM (N/A) as a surgical intervention .  The patient's history has been reviewed, patient examined, no change in status, stable for surgery.  I have reviewed the patient's chart and labs.  Questions were answered to the patient's satisfaction.     Kathlyn [place]

## 2013-08-24 ENCOUNTER — Other Ambulatory Visit: Payer: Self-pay | Admitting: Cardiology

## 2013-08-24 ENCOUNTER — Other Ambulatory Visit: Payer: Self-pay | Admitting: Physician Assistant

## 2013-08-24 DIAGNOSIS — J329 Chronic sinusitis, unspecified: Secondary | ICD-10-CM | POA: Diagnosis not present

## 2013-08-24 DIAGNOSIS — I6529 Occlusion and stenosis of unspecified carotid artery: Secondary | ICD-10-CM | POA: Diagnosis not present

## 2013-08-24 DIAGNOSIS — R579 Shock, unspecified: Secondary | ICD-10-CM

## 2013-08-24 DIAGNOSIS — I2510 Atherosclerotic heart disease of native coronary artery without angina pectoris: Secondary | ICD-10-CM | POA: Diagnosis not present

## 2013-08-24 DIAGNOSIS — Z951 Presence of aortocoronary bypass graft: Secondary | ICD-10-CM

## 2013-08-24 DIAGNOSIS — I25810 Atherosclerosis of coronary artery bypass graft(s) without angina pectoris: Secondary | ICD-10-CM | POA: Diagnosis not present

## 2013-08-24 DIAGNOSIS — E785 Hyperlipidemia, unspecified: Secondary | ICD-10-CM

## 2013-08-24 DIAGNOSIS — R079 Chest pain, unspecified: Secondary | ICD-10-CM | POA: Diagnosis not present

## 2013-08-24 DIAGNOSIS — I200 Unstable angina: Secondary | ICD-10-CM | POA: Diagnosis not present

## 2013-08-24 DIAGNOSIS — I739 Peripheral vascular disease, unspecified: Secondary | ICD-10-CM

## 2013-08-24 DIAGNOSIS — Z940 Kidney transplant status: Secondary | ICD-10-CM | POA: Diagnosis not present

## 2013-08-24 LAB — BASIC METABOLIC PANEL
BUN: 14 mg/dL (ref 6–23)
CALCIUM: 9.3 mg/dL (ref 8.4–10.5)
CO2: 28 mEq/L (ref 19–32)
Chloride: 103 mEq/L (ref 96–112)
Creatinine, Ser: 0.78 mg/dL (ref 0.50–1.10)
GFR calc Af Amer: >90 mL/min (ref >90)
GFR calc non Af Amer: >90 mL/min (ref >90)
GLUCOSE: 74 mg/dL (ref 70–99)
Potassium: 4.3 mEq/L (ref 3.7–5.3)
Sodium: 143 mEq/L (ref 137–147)

## 2013-08-24 LAB — CBC
HCT: 39.2 % (ref 36.0–46.0)
Hemoglobin: 12.4 g/dL (ref 12.0–15.0)
MCH: 28.9 pg (ref 26.0–34.0)
MCHC: 31.6 g/dL (ref 30.0–36.0)
MCV: 91.4 fL (ref 78.0–100.0)
Platelets: 219 10*3/uL (ref 150–400)
RBC: 4.29 MIL/uL (ref 3.87–5.11)
RDW: 14.7 % (ref 11.5–15.5)
WBC: 6.5 10*3/uL (ref 4.0–10.5)

## 2013-08-24 MED ORDER — METOPROLOL TARTRATE 25 MG PO TABS
25.0000 mg | ORAL_TABLET | Freq: Every day | ORAL | Status: DC
  Filled 2013-08-24: qty 1

## 2013-08-24 MED ORDER — INFLUENZA VAC SPLIT QUAD 0.5 ML IM SUSP
0.5000 mL | Freq: Once | INTRAMUSCULAR | Status: AC
  Administered 2013-08-24: 0.5 mL via INTRAMUSCULAR
  Filled 2013-08-24: qty 0.5

## 2013-08-24 MED ORDER — AMOXICILLIN-POT CLAVULANATE 875-125 MG PO TABS: 1.0000 | ORAL_TABLET | Freq: Two times a day (BID) | ORAL | Status: DC

## 2013-08-24 MED ORDER — METOPROLOL TARTRATE 25 MG PO TABS: 25.0000 mg | ORAL_TABLET | Freq: Every day | ORAL | Status: DC

## 2013-08-24 NOTE — Discharge Summary (Signed)
PATIENT DETAILS Name: Katherine [name] Age: 58 y.o. Sex: female Date of Birth: 28-Jan-1956 MRN: [id number]. Admit Date: 08/22/2013 Admitting Physician: Etta Quill, DO PCP:No PCP Per Patient  Recommendations for Outpatient Follow-up:  1. Gen. health maintenance 2. Cardiology will arrange for ABI and lower extremity arterial duplex to be done as outpatient 3. Lifelong dual antiplatelet therapy  PRIMARY DISCHARGE DIAGNOSIS:  Principal Problem:   Chest pain Active Problems:   CAD (coronary artery disease)      PAST MEDICAL HISTORY: Past Medical History  Diagnosis Date  . Chronic kidney disease   . Heart attack   . Hypertension   . Allergy   . Fibromyalgia   . Boils     lft and rt thigh, lft groin  . MRSA (methicillin resistant Staphylococcus aureus)   . Anxiety   . Depression   . Vomiting   . Poor appetite   . CAD (coronary artery disease) 10/21/0,48/27/03    restenting distal anastomotic site with drug eluting Taxus stent 90-10%;  [name] Kitchen S/P CABG (coronary artery bypass graft) 1995  . COPD (chronic obstructive pulmonary disease)   . OSA (obstructive sleep apnea)   . Dyslipidemia   . 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 Carotid stenosis 07/29/2009    right &left BULBS & ICA's plaque 0-49%    DISCHARGE MEDICATIONS:   Medication List         amoxicillin-clavulanate 875-125 MG per tablet  Commonly known as:  AUGMENTIN  Take 1 tablet by mouth 2 (two) times daily.     aspirin EC 81 MG tablet  Take 81 mg by mouth daily.     azaTHIOprine 50 MG tablet  Commonly known as:  IMURAN  Take 125 mg by mouth daily.     BUTRANS 20 MCG/HR Ptwk patch  Generic drug:  buprenorphine  Place 20 mcg onto the skin once a week.     calcitRIOL 0.25 MCG capsule  Commonly known as:  ROCALTROL  Take 0.25 mcg by mouth daily.     CITRACAL + D PO  Take by mouth daily.     clopidogrel 75  MG tablet  Commonly known as:  PLAVIX  TAKE 1 TABLET BY MOUTH EVERY DAY     cyclobenzaprine 10 MG tablet  Commonly known as:  FLEXERIL  Take 10 mg by mouth 3 (three) times daily.     DULoxetine 30 MG capsule  Commonly known as:  CYMBALTA  Take 30 mg by mouth daily.     fluticasone 50 MCG/ACT nasal spray  Commonly known as:  FLONASE  PLACE 2 SPRAYS INTO THE NOSE DAILY.     HYDROcodone-acetaminophen 5-325 MG per tablet  Commonly known as:  NORCO/VICODIN  Take 1 tablet by mouth 2 (two) times daily. Take every day per patient     LORazepam 1 MG tablet  Commonly known as:  ATIVAN  Take 1 mg by mouth every 8 (eight) hours.     metoprolol tartrate 25 MG tablet  Commonly known as:  LOPRESSOR  Take 1 tablet (25 mg total) by mouth daily.     montelukast 10 MG tablet  Commonly known as:  SINGULAIR  TAKE 1 TABLET ONCE A DAY     omeprazole 40 MG capsule  Commonly known as:  PRILOSEC  Take 40 mg by mouth daily.     PATANOL 0.1 % ophthalmic solution  Generic drug:  olopatadine  Place 1 drop into both eyes daily.     predniSONE 5 MG tablet  Commonly known as:  DELTASONE  Take 5 mg by mouth daily.     promethazine 25 MG tablet  Commonly known as:  PHENERGAN  Take 25 mg by mouth every 6 (six) hours as needed. For nausea and sleep     rosuvastatin 20 MG tablet  Commonly known as:  CRESTOR  Take 1 tablet (20 mg total) by mouth daily.     sertraline 100 MG tablet  Commonly known as:  ZOLOFT  Take 200 mg by mouth at bedtime.     traMADol 50 MG tablet  Commonly known as:  ULTRAM  Take 50 mg by mouth 3 (three) times daily as needed. For pain        ALLERGIES:   Allergies  Allergen Reactions  . Codeine Nausea And Vomiting  . Erythromycin Nausea And Vomiting  . Hydromorphone Hcl Nausea And Vomiting  . Nalbuphine Nausea And Vomiting  . Niaspan [Niacin Er] Other (See Comments)    unknown  . Sulfonamide Derivatives Other (See Comments)    Reaction: per patient "tears her  stomach up"  . Tetracycline Hives    BRIEF HPI:  See H&P, Labs, Consult and Test reports for all details in brief,is a 58 y.o. female who presents to the ED for chest pain.She states her central chest pain with radiation to both arms is identical to how her last MI presented back in 1994.   CONSULTATIONS:   cardiology  PERTINENT RADIOLOGIC STUDIES: Dg Chest 2 View  08/23/2013   CLINICAL DATA:  Chest pain  EXAM: CHEST  2 VIEW  COMPARISON:  05/23/2013  FINDINGS: Status post CABG.  Normal heart size.  Coronary arterial stents.  Chronic interstitial coarsening without edema or consolidation. No effusion or pneumothorax. Lap band has an unchanged, normal obliquity.  IMPRESSION: No active cardiopulmonary disease.   Electronically Signed   By: Jorje Guild M.D.   On: 08/23/2013 00:39     PERTINENT LAB RESULTS: CBC:  Recent Labs  08/22/13 2125 08/24/13 0545  WBC 8.2 6.5  HGB 12.0 12.4  HCT 37.4 39.2  PLT 249 219   CMET CMP     Component Value Date/Time   NA 143 08/24/2013 0545   K 4.3 08/24/2013 0545   CL 103 08/24/2013 0545   CO2 28 08/24/2013 0545   GLUCOSE 74 08/24/2013 0545   BUN 14 08/24/2013 0545   CREATININE 0.78 08/24/2013 0545   CALCIUM 9.3 08/24/2013 0545   PROT 7.4 08/22/2013 2126   ALBUMIN 3.4* 08/22/2013 2126   AST 27 08/22/2013 2126   ALT 16 08/22/2013 2126   ALKPHOS 132* 08/22/2013 2126   BILITOT 0.2* 08/22/2013 2126   GFRNONAA >90 08/24/2013 0545   GFRAA >90 08/24/2013 0545    GFR Estimated Creatinine Clearance: 76.5 ml/min (by C-G formula based on Cr of 0.78). No results found for this basename: LIPASE, AMYLASE,  in the last 72 hours  Recent Labs  08/23/13 0655 08/23/13 1130 08/23/13 1830  TROPONINI <0.30 <0.30 <0.30   No components found with this basename: POCBNP,  No results found for this basename: DDIMER,  in the last 72 hours No results found for this basename: HGBA1C,  in the last 72 hours No results found for this basename: CHOL, HDL, LDLCALC, TRIG, CHOLHDL,  LDLDIRECT,  in the last 72 hours No results found for this basename: TSH, T4TOTAL, FREET3, T3FREE, THYROIDAB,  in the last 72 hours  No results found for this basename: VITAMINB12, FOLATE, FERRITIN, TIBC, IRON, RETICCTPCT,  in the last 72 hours Coags: No results found for this basename: PT, INR,  in the last 72 hours Microbiology: Recent Results (from the past 240 hour(s))  MRSA PCR SCREENING     Status: None   Collection Time    08/23/13  3:22 AM      Result Value Range Status   MRSA by PCR NEGATIVE  NEGATIVE Final   Comment:            The GeneXpert MRSA Assay (FDA     approved for NASAL specimens     only), is one component of a     comprehensive MRSA colonization     surveillance program. It is not     intended to diagnose MRSA     infection nor to guide or     monitor treatment for     MRSA infections.     BRIEF HOSPITAL COURSE:  Chest pain  - Troponins were not noted to be elevated however, as this pain was similar to her previous when she had an MI, it was decided to perform a cardiac cath.  - Cath report:  Severe 2 vessel CAD with patent LIMA to LAD, SVG to RPDA and SVG to diagonal. However, there is 80% stenosis in SVG to diagonal proximal to previously placed stents.  2. Mildly elevated LVEDP.  3. Severe calcified disease in right common femoral artery.  4. Successful drug eluting stent placement to SVG to diagonal.  - cont life long dual antiplatelet therapy. Continue beta blockers and statins. - recommended to have ABI and lower extremity arterial duplex as outpt.   Anxiety/ Depression  - stable on current meds  Sinusitis - Empiric treatment with Augmentin, continue for 5 more days  History of renal transplant - Continue with immunosuppressants on  History of SVT  - Continue with metoprolol  TODAY-DAY OF DISCHARGE:  Subjective:   Katherine [name] today has no headache,no chest abdominal pain,no new weakness tingling or numbness, feels much better wants to  go home today.   Objective:   Blood pressure 122/69, pulse 70, temperature 97.5 F (36.4 C), temperature source Oral, resp. rate 20, height 5' (1.524 m), weight 85.9 kg (189 lb 6 oz), SpO2 98.00%.  Intake/Output Summary (Last 24 hours) at 08/24/13 1038 Last data filed at 08/24/13 0508  Gross per 24 hour  Intake   1920 ml  Output      0 ml  Net   1920 ml   Filed Weights   08/23/13 0259 08/24/13 0021  Weight: 85.684 kg (188 lb 14.4 oz) 85.9 kg (189 lb 6 oz)    Exam Awake Alert, Oriented *3, No new F.N deficits, Normal affect [place].AT,PERRAL Supple Neck,No JVD, No cervical lymphadenopathy appriciated.  Symmetrical Chest wall movement, Good air movement bilaterally, CTAB RRR,No Gallops,Rubs or new Murmurs, No Parasternal Heave +ve B.Sounds, Abd Soft, Non tender, No organomegaly appriciated, No rebound -guarding or rigidity. No Cyanosis, Clubbing or edema, No new Rash or bruise  DISCHARGE CONDITION: Stable  DISPOSITION: Home   DISCHARGE INSTRUCTIONS:    Activity:  As tolerated   Diet recommendation: Heart Healthy diet       Discharge Orders   Future Appointments Provider Department Dept Phone   08/28/2013 9:00 AM [facility] [place] [facility] [phone]   08/29/2013 8:30 AM Wellington Hampshire, MD [place] [facility] [phone]  09/11/2013 8:45 AM [name], MD [facility] [phone]   Future Orders Complete By Expires   Call MD for:  severe uncontrolled pain  As directed    Diet - low sodium heart healthy  As directed    Increase activity slowly  As directed       Follow-up Information   Follow up with Kathlyn Sacramento, MD On 08/29/2013. (8:30am)    Specialty:  Cardiology   Contact information:   [place] [address] [phone]       Follow up with DETERDING,JAMES L, MD. Schedule an appointment as soon as possible for a visit in 2 weeks.    Specialty:  Nephrology   Contact information:   [place] [address] [phone]       Total Time spent on discharge equals 45 minutes.  SignedOren Binet 08/24/2013 10:38 AM

## 2013-08-24 NOTE — Progress Notes (Signed)
Cardiology follow up appointment as well as LE doppler with ABIs have been arranged. Patient was noted to not be on a BB in the hospital. Previously followed by Dr. [name] who had her on lopressor 25mg  qd. Blood pressure and pulse stable. Will resume BB.   Perry Mount PA-C  [facility]  Patient seen and examined and history reviewed. Agree with above findings and plan.  Luana Shu 08/24/2013 10:27 AM

## 2013-08-24 NOTE — Progress Notes (Signed)
CARDIAC REHAB PHASE I   PRE:  Rate/Rhythm: 101ST  BP:  Supine:   Sitting: 122/69  Standing:    SaO2:   MODE:  Ambulation: 800 ft   POST:  Rate/Rhythm: 116ST  BP:  Supine:   Sitting: 127/81  Standing:    SaO2:  0805-0853 Pt walked 800 ft on RA with steady gait. Denied CP but stated felt a little shakey after walk. Pt states cannot walk much at home but can use stationary bike. Gave instructions for starting bike once groin healed. Education completed. Pt declined CRP 2 stating she preferred using stationary bike at home. Attended program in 1994. Pt states she eats very little,mainly protein since lap band. Has been under large amount stress this year with husband dealing with mouth cancer.   Graylon Good, RN BSN  08/24/2013 8:48 AM

## 2013-08-24 NOTE — Progress Notes (Signed)
   TELEMETRY: Reviewed telemetry pt in NSR, one 17 beat run of SVT: Filed Vitals:   08/23/13 2030 08/24/13 0021 08/24/13 0508 08/24/13 0735  BP: 132/79 112/58 125/61 122/69  Pulse: 78 72 69 70  Temp: 97.7 F (36.5 C) 97.8 F (36.6 C) 98.1 F (36.7 C) 97.5 F (36.4 C)  TempSrc: Oral Oral Oral Oral  Resp: 18 16 20 20   Height:      Weight:  189 lb 6 oz (85.9 kg)    SpO2: 98% 96% 95% 98%    Intake/Output Summary (Last 24 hours) at 08/24/13 1027 Last data filed at 08/24/13 0508  Gross per 24 hour  Intake   1920 ml  Output      0 ml  Net   1920 ml    SUBJECTIVE Patient feels well. No chest pain or SOB. No groin pain.  LABS: Basic Metabolic Panel:  Recent Labs  08/22/13 2126 08/24/13 0545  NA 138 143  K 4.1 4.3  CL 96 103  CO2 27 28  GLUCOSE 144* 74  BUN 18 14  CREATININE 0.81 0.78  CALCIUM 9.5 9.3   Liver Function Tests:  Recent Labs  08/22/13 2126  AST 27  ALT 16  ALKPHOS 132*  BILITOT 0.2*  PROT 7.4  ALBUMIN 3.4*   No results found for this basename: LIPASE, AMYLASE,  in the last 72 hours CBC:  Recent Labs  08/22/13 2125 08/24/13 0545  WBC 8.2 6.5  HGB 12.0 12.4  HCT 37.4 39.2  MCV 90.3 91.4  PLT 249 219   Cardiac Enzymes:  Recent Labs  08/23/13 0655 08/23/13 1130 08/23/13 1830  TROPONINI <0.30 <0.30 <0.30    Radiology/Studies:  Dg Chest 2 View  08/23/2013   CLINICAL DATA:  Chest pain  EXAM: CHEST  2 VIEW  COMPARISON:  05/23/2013  FINDINGS: Status post CABG.  Normal heart size.  Coronary arterial stents.  Chronic interstitial coarsening without edema or consolidation. No effusion or pneumothorax. Lap band has an unchanged, normal obliquity.  IMPRESSION: No active cardiopulmonary disease.   Electronically Signed   By: Jorje Guild M.D.   On: 08/23/2013 00:39   Ecg: NSR with nonspecific T wave abnormality.  PHYSICAL EXAM General: Well developed, well nourished, in no acute distress. Head: Normocephalic, atraumatic, sclera  non-icteric, no xanthomas, nares are without discharge. Neck: Negative for carotid bruits. JVD not elevated. Lungs: Clear bilaterally to auscultation without wheezes, rales, or rhonchi. Breathing is unlabored. Heart: RRR S1 S2 without murmurs, rubs, or gallops.  Abdomen: Soft, non-tender, non-distended with normoactive bowel sounds. No hepatomegaly. No rebound/guarding. No obvious abdominal masses. Msk:  Strength and tone appears normal for age. Extremities: No clubbing, cyanosis or edema.  Distal pedal pulses are 2+ and equal bilaterally. Right groin without hematoma. Neuro: Alert and oriented X 3. Moves all extremities spontaneously. Psych:  Responds to questions appropriately with a normal affect.  ASSESSMENT AND PLAN: 1. Unstable angina- s/p DES to SVG to diagonal. Needs to continue DAPT for one year. Stable for DC from cardiac standpoint. Will arrange follow up in 2 weeks. 2. CKD stable post cath. 3. HTN 4. SVT need to resume beta blocker Rx. 5. PAD - Dr. Fletcher Anon recommended LE arterial dopplers as outpatient- we will arrange.  Principal Problem:   Chest pain Active Problems:   CAD (coronary artery disease)    Signed, Jolean Madariaga [place] MD,FACC 08/24/2013 10:32 AM

## 2013-08-28 ENCOUNTER — Ambulatory Visit (HOSPITAL_COMMUNITY): Payer: Medicare Other | Attending: Physician Assistant

## 2013-08-28 ENCOUNTER — Encounter: Payer: Self-pay | Admitting: Cardiology

## 2013-08-28 DIAGNOSIS — I70209 Unspecified atherosclerosis of native arteries of extremities, unspecified extremity: Secondary | ICD-10-CM | POA: Diagnosis not present

## 2013-08-28 DIAGNOSIS — E785 Hyperlipidemia, unspecified: Secondary | ICD-10-CM | POA: Diagnosis not present

## 2013-08-28 DIAGNOSIS — I739 Peripheral vascular disease, unspecified: Secondary | ICD-10-CM

## 2013-08-28 DIAGNOSIS — R579 Shock, unspecified: Secondary | ICD-10-CM

## 2013-08-28 DIAGNOSIS — Z87891 Personal history of nicotine dependence: Secondary | ICD-10-CM | POA: Diagnosis not present

## 2013-08-28 DIAGNOSIS — I2510 Atherosclerotic heart disease of native coronary artery without angina pectoris: Secondary | ICD-10-CM | POA: Insufficient documentation

## 2013-08-28 DIAGNOSIS — Z951 Presence of aortocoronary bypass graft: Secondary | ICD-10-CM | POA: Diagnosis not present

## 2013-08-29 ENCOUNTER — Encounter: Payer: Medicare Other | Admitting: Cardiovascular Disease

## 2013-09-05 ENCOUNTER — Encounter: Payer: Medicare Other | Admitting: Cardiovascular Disease

## 2013-09-11 ENCOUNTER — Encounter: Payer: Self-pay | Admitting: Cardiology

## 2013-09-11 ENCOUNTER — Ambulatory Visit (INDEPENDENT_AMBULATORY_CARE_PROVIDER_SITE_OTHER): Payer: Medicare Other | Admitting: Cardiology

## 2013-09-11 VITALS — BP 110/70 | HR 84 | Ht 60.0 in | Wt 189.3 lb

## 2013-09-11 DIAGNOSIS — I2119 ST elevation (STEMI) myocardial infarction involving other coronary artery of inferior wall: Secondary | ICD-10-CM | POA: Diagnosis not present

## 2013-09-11 DIAGNOSIS — I200 Unstable angina: Secondary | ICD-10-CM

## 2013-09-11 DIAGNOSIS — R079 Chest pain, unspecified: Secondary | ICD-10-CM | POA: Diagnosis not present

## 2013-09-11 DIAGNOSIS — Z9861 Coronary angioplasty status: Secondary | ICD-10-CM

## 2013-09-11 DIAGNOSIS — Z951 Presence of aortocoronary bypass graft: Secondary | ICD-10-CM

## 2013-09-11 DIAGNOSIS — E1169 Type 2 diabetes mellitus with other specified complication: Secondary | ICD-10-CM

## 2013-09-11 DIAGNOSIS — I6529 Occlusion and stenosis of unspecified carotid artery: Secondary | ICD-10-CM

## 2013-09-11 DIAGNOSIS — I2510 Atherosclerotic heart disease of native coronary artery without angina pectoris: Secondary | ICD-10-CM | POA: Diagnosis not present

## 2013-09-11 DIAGNOSIS — E785 Hyperlipidemia, unspecified: Secondary | ICD-10-CM

## 2013-09-11 DIAGNOSIS — E1159 Type 2 diabetes mellitus with other circulatory complications: Secondary | ICD-10-CM

## 2013-09-11 DIAGNOSIS — I10 Essential (primary) hypertension: Secondary | ICD-10-CM | POA: Insufficient documentation

## 2013-09-11 DIAGNOSIS — I739 Peripheral vascular disease, unspecified: Secondary | ICD-10-CM

## 2013-09-11 MED ORDER — NITROGLYCERIN 0.4 MG SL SUBL: 0.4000 mg | SUBLINGUAL_TABLET | SUBLINGUAL | Status: DC | PRN

## 2013-09-11 NOTE — Progress Notes (Signed)
PATIENT: Katherine [name] MRN: [id number] DOB: 05-13-1956 PCP: No PCP Per Patient  Clinic Note: Chief Complaint  Patient presents with  . Chest Pain    Was in the hospital ~3 weeks ago for chest pain, had stent placed. C/o occas chest discomfort/tightness, "comes in twinges  . ROV 4 months RAW-DH    C/o C/o leg pain - Dr Fletcher Anon ordered doppler, edema.   HPI: Katherine [name] is a 58 y.o. female with a PMH below who presents today for post hospital followup. She is a former patient of Dr. Terance Ice is scheduled to see me in followup this month, however she ended up presenting with unstable angina/acute coronary syndrome in early January of 2015. She was admitted to the [facility], and underwent cardiac catheterization demonstrating a new finding of mid graft stenosis of the SVG-diagonal. There is only 50% ISR in the recently placed drug loading stent in the vein graft to the diagonal. Dr.Arida performed PCI of the mid graft with a Promus Premier stent. She was discharged in stable condition..  Interval History: She follows up for post hospital discharge evaluation, and is doing relatively well. She does note occasional "twinging sensations in her chest but nothing also stated with her typical anginal symptoms. She is trying to get out and about now chasing after her adopted child, but does get somewhat short of breath with walking around as is her baseline. She really denies any resting or exertional angina or significant dyspnea. No PND, orthopnea, or edema. Notably with her history of GI bleeds on aspirin Plavix, she's not had any melena, hematochezia or hematuria. She denies any wrap her regular heartbeats to suggest any recurrence of the atrial fibrillation that was noted in her chart. She does not recall having this diagnosis. She denies any syncope or near syncope. No TIA , CVA or amaurosis fugax symptoms.  As part of evaluation, she was noted to have calcified femoral  arteries, as being evaluated by Dr. Fletcher Anon in in early February for possible PAD. She does not really note claudication, stating that she is more bothered with joint pains and muscle pains.   Past Medical History  Diagnosis Date  . Glomerulonephritis, chronic, rapidly progressive 71  . ESRD (end stage renal disease) 1991    s/p Cadaveric Renal Transplant [facility] - Dr. Jimmy Footman)   . S/p cadaver renal transplant [place]  . 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 07/1993    Rescue PTCA of RCA -- referred for CABG.  . CAD in native artery 07/1993     3 Vessel Disease (LAD-D1 & RCA) -- CABG  . 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 Servando Snare: LIMA-LAD, SVG-bifurcatingD1, SVG-rPDA  . CAD (coronary artery disease) of bypass graft 5/01; 3/'02, 8/'03, 10/'04; 1/15    PCI x 5 to SVG-D1   . CAD S/P percutaneous coronary angioplasty     PCI to SVG-D1 insertion/native D1 x 4 = '01 -(S660 BMS 2.5 x 9 - insertion into D1; '02 - distal overlap ACS Pixel 2.5 x 8  BMS; '03 distal/native ISR/Thrombosis - Pixel 2.5 x 13; '04 - ISR-  Taxus 2.5 x 20 (covered all);; 1/15 - mid SVG-D1 (50% distal ISR) - Promus P 2.75 x 20 -- 2.8 mm  . Unstable angina 5/01; 3/'02, 8/'03, 10/'04; 1/15    x 5 occurences since Inf-Post STEMI in 1994  . Diabetes mellitus type 2 in obese   . COPD  mixed type     Followed by Dr. Lamonte Sakai  . OSA on CPAP   . Dyslipidemia, goal LDL below 70     08/2012: TC 137, TG 200, HDL 32!, LDL 45; on statin (followed by Dr.Deterding)  . Bilateral carotid artery stenosis 07/2009    Mild - moderate ~<0-49% internal carotid bilaterally; right bullb 50-69%  . Obesity     s/p Lap Band 04/2004- Port Replacement 10/09 & 2/10 for infection; Dr. Excell Seltzer.  [name] Kitchen MRSA (methicillin resistant staph aureus) culture positive   . Hypertension associated with diabetes   . Depression with anxiety   . Recurrent boils     Bilateral Groin  . Fibromyalgia   . Rheumatoid arthritis     Per Patient  Report; associated with OA  . H/O: GI bleed   . PAF (paroxysmal atrial fibrillation)     By report - no recurrence documented in chart  . PAD (peripheral artery disease) 08/2013    LEA Dopplers to be read by Dr. Fletcher Anon    Prior Cardiac Evaluation and Past Surgical History: Past Surgical History  Procedure Laterality Date  . Laparoscopic gastric banding  04/2004; 10/'09, 2/'10    Port Replacement x 2  . Catheter removal    . Coronary artery bypass graft  1995    LIMA-LAD, SVG-RPDA, SVG-D1  . Kidney transplant  1991  . Wrist fistula repair    . Tubal ligation    . Cardiac catheterization  5/'01, 3/'02, 8/'03, 10/'04; 1/'15    08/22/2013: LAD & RCA 100%; LIMA-LAD & SVG-rPDA patent; Cx-- OM1 60%, OM2 ostial ~50%; SVG-D1 - 80% mid, 50% distal ISR --PCI  . Port-a-cath removal      kidney  . Incise and drain abcess    . Cardiac catheterization  5/'01, 3/'02, 8/'03, 10/'04; 1/'15    08/22/2013: LAD & RCA 100%; LIMA-LAD & SVG-rPDA patent; Cx-- OM1 60%, OM2 ostial ~50%; SVG-D1 - 80% mid, 50% distal ISR --PCI  . Percutaneous coronary stent intervention (pci-s)  5/'01, 3/'02, 8/'03, 10/'04;    '01 - S660 BMS 2.5 x 9 - dSVG-D1 into D1; '02- post-stent stenosis - 2.5 x 8 Pixel BMS; '8\03: ISR/Thrombosis into native D1 - AngioJet, 2.5 x 13 Pixel; '04 - ISR 95% - covered stented area with Taxus DES 2.5 mm x 20 (2.88)  . Percutaneous coronary stent intervention (pci-s)  08/22/2013    mid SVG-D1 80%; distal stent ~50% ISR; Promus Prermier DES 2.75 mm xc 20 mm (2.8 mm)  . Transthoracic echocardiogram  02/2013    NL LV Size, EF 55-60%; MAC with Mild MR.  . Lower extremity arterial dopplers  08/2013    ABI: R 0.96, L 1.04    Allergies  Allergen Reactions  . Codeine Nausea And Vomiting  . Erythromycin Nausea And Vomiting  . Hydromorphone Hcl Nausea And Vomiting  . Nalbuphine Nausea And Vomiting  . Niaspan [Niacin Er] Other (See Comments)    unknown  . Sulfonamide Derivatives Other (See Comments)     Reaction: per patient "tears her stomach up"  . Tetracycline Hives    Current Outpatient Prescriptions  Medication Sig Dispense Refill  . aspirin EC 81 MG tablet Take 81 mg by mouth daily.      [name] Kitchen azaTHIOprine (IMURAN) 50 MG tablet Take 125 mg by mouth daily.      . buprenorphine (BUTRANS) 20 MCG/HR PTWK Place 20 mcg onto the skin once a week.       . calcitRIOL (ROCALTROL) 0.25 MCG  capsule Take 0.25 mcg by mouth daily.      . Calcium Citrate-Vitamin D (CITRACAL + D PO) Take by mouth daily.      . clopidogrel (PLAVIX) 75 MG tablet TAKE 1 TABLET BY MOUTH EVERY DAY  30 tablet  2  . cyclobenzaprine (FLEXERIL) 10 MG tablet Take 10 mg by mouth 3 (three) times daily.       . DULoxetine (CYMBALTA) 30 MG capsule Take 30 mg by mouth daily.        . fluticasone (FLONASE) 50 MCG/ACT nasal spray PLACE 2 SPRAYS INTO THE NOSE DAILY.  8.5 g  2  . HYDROcodone-acetaminophen (NORCO/VICODIN) 5-325 MG per tablet Take 1 tablet by mouth 2 (two) times daily. Take every day per patient      . LORazepam (ATIVAN) 1 MG tablet Take 1 mg by mouth 2 (two) times daily.       . metoprolol tartrate (LOPRESSOR) 25 MG tablet Take 12.5 mg by mouth daily.      . montelukast (SINGULAIR) 10 MG tablet TAKE 1 TABLET ONCE A DAY  30 tablet  0  . omeprazole (PRILOSEC) 40 MG capsule Take 40 mg by mouth daily.        [name] Kitchen PATANOL 0.1 % ophthalmic solution Place 1 drop into both eyes daily.       . predniSONE (DELTASONE) 5 MG tablet Take 5 mg by mouth daily.        . promethazine (PHENERGAN) 25 MG tablet Take 25 mg by mouth every 6 (six) hours as needed. For nausea and sleep      . rosuvastatin (CRESTOR) 20 MG tablet Take 1 tablet (20 mg total) by mouth daily.  30 tablet  6  . sertraline (ZOLOFT) 100 MG tablet Take 200 mg by mouth at bedtime.      . traMADol (ULTRAM) 50 MG tablet Take 50 mg by mouth 3 (three) times daily as needed. For pain      . amoxicillin-clavulanate (AUGMENTIN) 875-125 MG per tablet Take 1 tablet by mouth 2 (two) times  daily.  10 tablet  0  . nitroGLYCERIN (NITROSTAT) 0.4 MG SL tablet Place 1 tablet (0.4 mg total) under the tongue every 5 (five) minutes as needed for chest pain.  25 tablet  6   No current facility-administered medications for this visit.    History   Social History Narrative   She is currently married, and the caregiver of her husband who is recovering from surgery for tongue cancer now diagnosed with lung cancer. Prior to his diagnosis of her husband, she actually had adopted a 62-year-old child who she knows caring for as well. With all the surgeries, they have been quite financially troubled. Thanks the help of her community and church, they have been able to stay "alfoat."     She is a former smoker who quit in 2004 after a 30-pack-year history.   She is active chasing a 55-year-old child, does not do routine exercise. She's been quite depressed with the condition of her husband, and admits to eating comfort herself.   She does not drink alcohol.    family history includes Cancer in her father and mother; Heart disease in her father.  ROS: A comprehensive Review of Systems - Negative except Mild muscular skeletal chest twinges. She also notes swelling in her hands that is worse in the morning. She describes tightness and soreness in her hands especially with cold. This is suggestive of possible Raynaud's type symptoms. She also has  arthritic changes of her hands are somewhat painful. she has gained 15 pounds since her September visit with Dr. [name], stating that she is "comfort eating "  PHYSICAL EXAM BP 110/70  Pulse 84  Ht 5' (1.524 m)  Wt 189 lb 4.8 oz (85.866 kg)  BMI 36.97 kg/m2 General appearance: alert, cooperative, appears stated age, no distress and moderately obese Neck: no adenopathy, no carotid bruit, no JVD and supple, symmetrical, trachea midline Lungs: clear to auscultation bilaterally, normal percussion bilaterally and Nonlabored, good air movement Heart: RRR,  normal S1 and S2-2. She does have a lateralized PMI is sustained. There is a soft S4 gallop but no S3. His also an 2/6 SEM radiating to the carotids. It is also soft HSM that goes toward the apex. Abdomen: Moderate obesity but otherwise soft, NT/ND./NA BS. No HSM. Extremities: extremities normal, atraumatic, no cyanosis or edema Pulses: At least one if not 2+ bilateral extremity pulses on exam. She does have mild faint femoral bruits Neurologic: Grossly normal  [id number] today: Yes Rate: 84 , Rhythm: NSR, Non-specific ST-T changes.  Recent Labs: Reviewed hospitalization labs, cholesterol levels were not checked; most recent lipid panel demonstrated total 137, triglycerides 200, HDL 32, LDL of 45 -->   ASSESSMENT / PLAN: Relatively stable and controlled with a long-standing cardiac history status post recent PCI for unstable angina after all most half year interval since her last PCI. Thankfully, the previous stents in the SVG to diagonal were patent with only 50% ISR. This would suggest better longevity of a drug-eluting stent. She does have moderate disease in the native OM system it is not revascularized. Thankfully she has a preserved EF, with no heart failure symptoms. Unfortunately she has gained weight since September.  Unstable angina; recurrent She's doing relatively well post PCI. No real active anginal symptoms appear she has some mild twinges in her chest that is not anginal in nature. He is doing well on her statin, beta blocker and dual antiplatelet therapy.  She will be on dual therapy essentially lifelong, based on her progressive disease. Thankfully she did not rule in for MI during this last hospitalization.  I have prescribed PRN SL NTG.  CAD S/P percutaneous coronary angioplasty - PCI x 5 to SVG-D1 Multiple PCI to the distal/anastomotic SVG-D1 with the most recent stent in that location being in Taxus DES in 2004 that appears to be patent with only 50% ISR.  The most  recent PCI location was not involved at all with the previous PCI locations and was treated with a drug-eluting stent. Hopefully with aggressive risk factor modification, we can ensure that these sites they open. She will be on dual antiplatelet therapy lifelong  Dyslipidemia, goal LDL below 70 Her nephrologist and checking her lipids. I have not seen any recent labs since January of last year. At that time she had mildly elevated triglycerides and below goal HDL. He is on a good dose of Crestor, and I am reluctant to put her on a medication for it she'll improvement since her generally not considered to be of much help. Hopefully with dietary modification and losing the weight back that she has gained we can get better control of the triglycerides. I also recommended that she continue to try to stay active and exercise.  Hypertension associated with diabetes Blood pressure is well-controlled today and only status she is on low-dose beta blocker. She's taking her Lopressor 25 mg a day as recommended that she takes at 12.5 twice a day  for better coverage.  PAD (peripheral artery disease) A 40 for repeat of the Dopplers currently. The ABIs not that bad. She'll be seeing Dr. Fletcher Anon at the [facility] office in early February to discuss the results.  Occlusion and stenosis of carotid artery without mention of cerebral infarction Will order followup Dopplers for September this year.  Morbid obesity - s/p Lap Band 9/'05 I encouraged improved dietary modification with increased activity. She is also to see her lap and doctor to have her been adjusted in order to decrease her by mouth intake.      Orders Placed This Encounter  Procedures  . EKG 12-Lead   Meds ordered this encounter  Medications  . metoprolol tartrate (LOPRESSOR) 25 MG tablet    Sig: Take 12.5 mg by mouth daily.  . nitroGLYCERIN (NITROSTAT) 0.4 MG SL tablet    Sig: Place 1 tablet (0.4 mg total) under the tongue every 5 (five)  minutes as needed for chest pain.    Dispense:  25 tablet    Refill:  6   I spent close to 40 minutes with the patient directly discussing her medical history and complaints. Also spent well over 45 minutes reviewing the patient's chart, as she is new to me.  Followup: 4 months  Kaeson Kleinert W. Ellyn Hack, M.D., M.S. [facility] [address] [place], [place]  [address]  [phone] Pager # [phone]

## 2013-09-11 NOTE — Assessment & Plan Note (Signed)
A 40 for repeat of the Dopplers currently. The ABIs not that bad. She'll be seeing Dr. Fletcher Anon at the [facility] office in early February to discuss the results.

## 2013-09-11 NOTE — Assessment & Plan Note (Signed)
Multiple PCI to the distal/anastomotic SVG-D1 with the most recent stent in that location being in Taxus DES in 2004 that appears to be patent with only 50% ISR.  The most recent PCI location was not involved at all with the previous PCI locations and was treated with a drug-eluting stent. Hopefully with aggressive risk factor modification, we can ensure that these sites they open. She will be on dual antiplatelet therapy lifelong

## 2013-09-11 NOTE — Assessment & Plan Note (Signed)
Will order followup Dopplers for September this year.

## 2013-09-11 NOTE — Assessment & Plan Note (Signed)
Blood pressure is well-controlled today and only status she is on low-dose beta blocker. She's taking her Lopressor 25 mg a day as recommended that she takes at 12.5 twice a day for better coverage.

## 2013-09-11 NOTE — Assessment & Plan Note (Signed)
Her nephrologist and checking her lipids. I have not seen any recent labs since January of last year. At that time she had mildly elevated triglycerides and below goal HDL. He is on a good dose of Crestor, and I am reluctant to put her on a medication for it she'll improvement since her generally not considered to be of much help. Hopefully with dietary modification and losing the weight back that she has gained we can get better control of the triglycerides. I also recommended that she continue to try to stay active and exercise.

## 2013-09-11 NOTE — Patient Instructions (Addendum)
Please increase taking metoprolol tart 1/2 tablet twice a day.  Prescription sent for NTG TABLETS sublingual as needed  Your physician wants you to follow-up in 4 months [name]. You will receive a reminder letter in the mail two months in advance. If you don't receive a letter, please call our office to schedule the follow-up appointment.

## 2013-09-11 NOTE — Assessment & Plan Note (Signed)
She's doing relatively well post PCI. No real active anginal symptoms appear she has some mild twinges in her chest that is not anginal in nature. He is doing well on her statin, beta blocker and dual antiplatelet therapy.  She will be on dual therapy essentially lifelong, based on her progressive disease. Thankfully she did not rule in for MI during this last hospitalization.  I have prescribed PRN SL NTG.

## 2013-09-11 NOTE — Assessment & Plan Note (Signed)
I encouraged improved dietary modification with increased activity. She is also to see her lap and doctor to have her been adjusted in order to decrease her by mouth intake.

## 2013-09-12 ENCOUNTER — Other Ambulatory Visit: Payer: Self-pay | Admitting: Internal Medicine

## 2013-09-18 ENCOUNTER — Other Ambulatory Visit: Payer: Self-pay | Admitting: *Deleted

## 2013-09-18 MED ORDER — METOPROLOL TARTRATE 25 MG PO TABS: ORAL_TABLET | ORAL | Status: DC

## 2013-09-18 NOTE — Telephone Encounter (Signed)
E-sent RX

## 2013-09-19 ENCOUNTER — Other Ambulatory Visit: Payer: Self-pay | Admitting: Emergency Medicine

## 2013-09-19 ENCOUNTER — Encounter: Payer: Medicare Other | Admitting: Cardiovascular Disease

## 2013-09-21 ENCOUNTER — Telehealth (INDEPENDENT_AMBULATORY_CARE_PROVIDER_SITE_OTHER): Payer: Self-pay

## 2013-09-21 NOTE — Telephone Encounter (Signed)
Message copied by [name] Coria on Thu Sep 21, 2013 10:10 AM ------      Message from: Joselyn Arrow      Created: Mon Sep 11, 2013 12:38 PM      Regarding: APPT      Contact: [phone]       PT REQ LB ADJUSTMENT APPT WITH [facility].  FIRST AVAIL IS IN MARCH THAT I CAN FIND.  SHE REQ EARLIER. GM ------

## 2013-09-21 NOTE — Telephone Encounter (Signed)
error 

## 2013-09-25 DIAGNOSIS — I129 Hypertensive chronic kidney disease with stage 1 through stage 4 chronic kidney disease, or unspecified chronic kidney disease: Secondary | ICD-10-CM | POA: Diagnosis not present

## 2013-09-25 DIAGNOSIS — E785 Hyperlipidemia, unspecified: Secondary | ICD-10-CM | POA: Diagnosis not present

## 2013-09-25 DIAGNOSIS — D631 Anemia in chronic kidney disease: Secondary | ICD-10-CM | POA: Diagnosis not present

## 2013-09-25 DIAGNOSIS — N182 Chronic kidney disease, stage 2 (mild): Secondary | ICD-10-CM | POA: Diagnosis not present

## 2013-09-25 DIAGNOSIS — N039 Chronic nephritic syndrome with unspecified morphologic changes: Secondary | ICD-10-CM | POA: Diagnosis not present

## 2013-09-25 DIAGNOSIS — Z940 Kidney transplant status: Secondary | ICD-10-CM | POA: Diagnosis not present

## 2013-09-25 DIAGNOSIS — N390 Urinary tract infection, site not specified: Secondary | ICD-10-CM | POA: Diagnosis not present

## 2013-09-25 DIAGNOSIS — E118 Type 2 diabetes mellitus with unspecified complications: Secondary | ICD-10-CM | POA: Diagnosis not present

## 2013-09-26 DIAGNOSIS — M7989 Other specified soft tissue disorders: Secondary | ICD-10-CM | POA: Diagnosis not present

## 2013-09-26 DIAGNOSIS — F411 Generalized anxiety disorder: Secondary | ICD-10-CM | POA: Diagnosis not present

## 2013-09-26 DIAGNOSIS — M19049 Primary osteoarthritis, unspecified hand: Secondary | ICD-10-CM | POA: Diagnosis not present

## 2013-09-26 DIAGNOSIS — IMO0001 Reserved for inherently not codable concepts without codable children: Secondary | ICD-10-CM | POA: Diagnosis not present

## 2013-10-05 ENCOUNTER — Encounter (INDEPENDENT_AMBULATORY_CARE_PROVIDER_SITE_OTHER): Payer: Medicare Other

## 2013-10-10 ENCOUNTER — Encounter: Payer: Medicare Other | Admitting: Cardiovascular Disease

## 2013-10-12 ENCOUNTER — Encounter (INDEPENDENT_AMBULATORY_CARE_PROVIDER_SITE_OTHER): Payer: Medicare Other | Admitting: General Surgery

## 2013-10-13 ENCOUNTER — Encounter (INDEPENDENT_AMBULATORY_CARE_PROVIDER_SITE_OTHER): Payer: Medicare Other | Admitting: General Surgery

## 2013-10-15 ENCOUNTER — Encounter (HOSPITAL_COMMUNITY): Payer: Self-pay | Admitting: Emergency Medicine

## 2013-10-15 ENCOUNTER — Emergency Department (HOSPITAL_COMMUNITY)
Admission: EM | Admit: 2013-10-15 | Discharge: 2013-10-16 | Disposition: A | Discharge status: Home / Self Care | Payer: Medicare Other | Attending: Emergency Medicine | Admitting: Emergency Medicine

## 2013-10-15 DIAGNOSIS — Z9861 Coronary angioplasty status: Secondary | ICD-10-CM | POA: Insufficient documentation

## 2013-10-15 DIAGNOSIS — Z940 Kidney transplant status: Secondary | ICD-10-CM | POA: Diagnosis not present

## 2013-10-15 DIAGNOSIS — Z792 Long term (current) use of antibiotics: Secondary | ICD-10-CM | POA: Diagnosis not present

## 2013-10-15 DIAGNOSIS — J4489 Other specified chronic obstructive pulmonary disease: Secondary | ICD-10-CM | POA: Insufficient documentation

## 2013-10-15 DIAGNOSIS — IMO0001 Reserved for inherently not codable concepts without codable children: Secondary | ICD-10-CM | POA: Diagnosis not present

## 2013-10-15 DIAGNOSIS — R5381 Other malaise: Secondary | ICD-10-CM | POA: Diagnosis not present

## 2013-10-15 DIAGNOSIS — Z8614 Personal history of Methicillin resistant Staphylococcus aureus infection: Secondary | ICD-10-CM | POA: Insufficient documentation

## 2013-10-15 DIAGNOSIS — Z87891 Personal history of nicotine dependence: Secondary | ICD-10-CM | POA: Diagnosis not present

## 2013-10-15 DIAGNOSIS — Z7902 Long term (current) use of antithrombotics/antiplatelets: Secondary | ICD-10-CM | POA: Insufficient documentation

## 2013-10-15 DIAGNOSIS — E119 Type 2 diabetes mellitus without complications: Secondary | ICD-10-CM | POA: Diagnosis not present

## 2013-10-15 DIAGNOSIS — J449 Chronic obstructive pulmonary disease, unspecified: Secondary | ICD-10-CM | POA: Insufficient documentation

## 2013-10-15 DIAGNOSIS — I200 Unstable angina: Secondary | ICD-10-CM | POA: Insufficient documentation

## 2013-10-15 DIAGNOSIS — R6889 Other general symptoms and signs: Secondary | ICD-10-CM | POA: Diagnosis not present

## 2013-10-15 DIAGNOSIS — R112 Nausea with vomiting, unspecified: Secondary | ICD-10-CM | POA: Diagnosis not present

## 2013-10-15 DIAGNOSIS — F341 Dysthymic disorder: Secondary | ICD-10-CM | POA: Diagnosis not present

## 2013-10-15 DIAGNOSIS — Z872 Personal history of diseases of the skin and subcutaneous tissue: Secondary | ICD-10-CM | POA: Diagnosis not present

## 2013-10-15 DIAGNOSIS — Z79899 Other long term (current) drug therapy: Secondary | ICD-10-CM | POA: Insufficient documentation

## 2013-10-15 DIAGNOSIS — N186 End stage renal disease: Secondary | ICD-10-CM | POA: Insufficient documentation

## 2013-10-15 DIAGNOSIS — J984 Other disorders of lung: Secondary | ICD-10-CM | POA: Diagnosis not present

## 2013-10-15 DIAGNOSIS — R05 Cough: Secondary | ICD-10-CM | POA: Insufficient documentation

## 2013-10-15 DIAGNOSIS — M069 Rheumatoid arthritis, unspecified: Secondary | ICD-10-CM | POA: Diagnosis not present

## 2013-10-15 DIAGNOSIS — G4733 Obstructive sleep apnea (adult) (pediatric): Secondary | ICD-10-CM | POA: Diagnosis not present

## 2013-10-15 DIAGNOSIS — I4891 Unspecified atrial fibrillation: Secondary | ICD-10-CM | POA: Insufficient documentation

## 2013-10-15 DIAGNOSIS — M1990 Unspecified osteoarthritis, unspecified site: Secondary | ICD-10-CM | POA: Diagnosis not present

## 2013-10-15 DIAGNOSIS — I25810 Atherosclerosis of coronary artery bypass graft(s) without angina pectoris: Secondary | ICD-10-CM | POA: Diagnosis not present

## 2013-10-15 DIAGNOSIS — I10 Essential (primary) hypertension: Secondary | ICD-10-CM | POA: Diagnosis not present

## 2013-10-15 DIAGNOSIS — R059 Cough, unspecified: Secondary | ICD-10-CM | POA: Diagnosis not present

## 2013-10-15 DIAGNOSIS — I252 Old myocardial infarction: Secondary | ICD-10-CM | POA: Diagnosis not present

## 2013-10-15 DIAGNOSIS — Z992 Dependence on renal dialysis: Secondary | ICD-10-CM | POA: Insufficient documentation

## 2013-10-15 DIAGNOSIS — Z7982 Long term (current) use of aspirin: Secondary | ICD-10-CM | POA: Insufficient documentation

## 2013-10-15 DIAGNOSIS — R5383 Other fatigue: Secondary | ICD-10-CM | POA: Diagnosis not present

## 2013-10-15 DIAGNOSIS — Z951 Presence of aortocoronary bypass graft: Secondary | ICD-10-CM | POA: Insufficient documentation

## 2013-10-15 DIAGNOSIS — R51 Headache: Secondary | ICD-10-CM | POA: Insufficient documentation

## 2013-10-15 DIAGNOSIS — I120 Hypertensive chronic kidney disease with stage 5 chronic kidney disease or end stage renal disease: Secondary | ICD-10-CM | POA: Insufficient documentation

## 2013-10-15 DIAGNOSIS — Z9884 Bariatric surgery status: Secondary | ICD-10-CM | POA: Diagnosis not present

## 2013-10-15 DIAGNOSIS — IMO0002 Reserved for concepts with insufficient information to code with codable children: Secondary | ICD-10-CM | POA: Insufficient documentation

## 2013-10-15 MED ORDER — SODIUM CHLORIDE 0.9 % IV BOLUS (SEPSIS)
1000.0000 mL | Freq: Once | INTRAVENOUS | Status: AC
  Administered 2013-10-16: 1000 mL via INTRAVENOUS

## 2013-10-15 MED ORDER — ONDANSETRON HCL 4 MG/2ML IJ SOLN
4.0000 mg | Freq: Once | INTRAMUSCULAR | Status: AC
  Administered 2013-10-16: 4 mg via INTRAVENOUS
  Filled 2013-10-15: qty 2

## 2013-10-15 NOTE — ED Notes (Signed)
Pt has been sleeping soundly since shortly after arrival in ED.  Now is awake and asking for pain med for headache and for drink as she is thirsty.  Have not seen any episodes of dry heaves or emesis .

## 2013-10-15 NOTE — ED Notes (Signed)
Nausea with dry heaves off and on over 2 weeks.  Pt had lap band in 2005.  Reports this is not the same symptoms as lap band issues.

## 2013-10-16 ENCOUNTER — Emergency Department (HOSPITAL_COMMUNITY): Payer: Medicare Other

## 2013-10-16 DIAGNOSIS — J984 Other disorders of lung: Secondary | ICD-10-CM | POA: Diagnosis not present

## 2013-10-16 LAB — URINALYSIS, ROUTINE W REFLEX MICROSCOPIC
BILIRUBIN URINE: NEGATIVE
Glucose, UA: NEGATIVE mg/dL
Hgb urine dipstick: NEGATIVE
Ketones, ur: NEGATIVE mg/dL
LEUKOCYTES UA: NEGATIVE
Nitrite: NEGATIVE
PH: 7.5 (ref 5.0–8.0)
PROTEIN: NEGATIVE mg/dL
Specific Gravity, Urine: 1.017 (ref 1.005–1.030)
Urobilinogen, UA: 0.2 mg/dL (ref 0.0–1.0)

## 2013-10-16 LAB — BASIC METABOLIC PANEL
BUN: 13 mg/dL (ref 6–23)
CHLORIDE: 100 meq/L (ref 96–112)
CO2: 25 meq/L (ref 19–32)
Calcium: 10.3 mg/dL (ref 8.4–10.5)
Creatinine, Ser: 0.67 mg/dL (ref 0.50–1.10)
GFR calc Af Amer: >90 mL/min (ref >90)
GFR calc non Af Amer: >90 mL/min (ref >90)
GLUCOSE: 95 mg/dL (ref 70–99)
Potassium: 4 mEq/L (ref 3.7–5.3)
SODIUM: 139 meq/L (ref 137–147)

## 2013-10-16 LAB — CBC WITH DIFFERENTIAL/PLATELET
Basophils Absolute: 0 10*3/uL (ref 0.0–0.1)
Basophils Relative: 0 % (ref 0–1)
EOS PCT: 1 % (ref 0–5)
Eosinophils Absolute: 0.1 10*3/uL (ref 0.0–0.7)
HEMATOCRIT: 43.3 % (ref 36.0–46.0)
Hemoglobin: 14.5 g/dL (ref 12.0–15.0)
LYMPHS PCT: 20 % (ref 12–46)
Lymphs Abs: 1.4 10*3/uL (ref 0.7–4.0)
MCH: 29.1 pg (ref 26.0–34.0)
MCHC: 33.5 g/dL (ref 30.0–36.0)
MCV: 86.9 fL (ref 78.0–100.0)
Monocytes Absolute: 0.5 10*3/uL (ref 0.1–1.0)
Monocytes Relative: 7 % (ref 3–12)
Neutro Abs: 5.1 10*3/uL (ref 1.7–7.7)
Neutrophils Relative %: 72 % (ref 43–77)
Platelets: 239 10*3/uL (ref 150–400)
RBC: 4.98 MIL/uL (ref 3.87–5.11)
RDW: 14.7 % (ref 11.5–15.5)
WBC: 7.1 10*3/uL (ref 4.0–10.5)

## 2013-10-16 NOTE — ED Notes (Signed)
Ginger ale given.  Pt ready for xray.

## 2013-10-16 NOTE — ED Provider Notes (Signed)
CSN: [place]:[id number]     Arrival date & time 10/15/13  2026 History   First MD Initiated Contact with Patient 10/15/13 2304     Chief Complaint  Patient presents with  . Nausea      HPI Patient presents for moderate nausea for past 2 weeks.  It has been on/off.  She also reports nonbloody emesis Her course is worsening, nothing improves her symptoms.   No diarrhea.  No new constipation No cp/sob. She does report cough No focal weakness She does report mild HA No abd pain She does report some recent dysuria   Past Medical History  Diagnosis Date  . Glomerulonephritis, chronic, rapidly progressive 75  . ESRD (end stage renal disease) 1991    s/p Cadaveric Renal Transplant [facility] - Dr. Jimmy Footman)   . S/p cadaver renal transplant [place]  . 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 07/1993    Rescue PTCA of RCA -- referred for CABG.  . CAD in native artery 07/1993     3 Vessel Disease (LAD-D1 & RCA) -- CABG  . 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 Servando Snare: LIMA-LAD, SVG-bifurcatingD1, SVG-rPDA  . CAD (coronary artery disease) of bypass graft 5/01; 3/'02, 8/'03, 10/'04; 1/15    PCI x 5 to SVG-D1   . CAD S/P percutaneous coronary angioplasty     PCI to SVG-D1 insertion/native D1 x 4 = '01 -(S660 BMS 2.5 x 9 - insertion into D1; '02 - distal overlap ACS Pixel 2.5 x 8  BMS; '03 distal/native ISR/Thrombosis - Pixel 2.5 x 13; '04 - ISR-  Taxus 2.5 x 20 (covered all);; 1/15 - mid SVG-D1 (50% distal ISR) - Promus P 2.75 x 20 -- 2.8 mm  . Unstable angina 5/01; 3/'02, 8/'03, 10/'04; 1/15    x 5 occurences since Inf-Post STEMI in 1994  . Diabetes mellitus type 2 in obese   . COPD mixed type     Followed by Dr. Lamonte Sakai  . OSA on CPAP   . Dyslipidemia, goal LDL below 70     08/2012: TC 137, TG 200, HDL 32!, LDL 45; on statin (followed by Dr.Deterding)  . Bilateral carotid artery stenosis 07/2009    Mild - moderate ~<0-49% internal carotid bilaterally; right bullb 50-69%   . Obesity     s/p Lap Band 04/2004- Port Replacement 10/09 & 2/10 for infection; Dr. Excell Seltzer.  [name] Kitchen MRSA (methicillin resistant staph aureus) culture positive   . Hypertension associated with diabetes   . Depression with anxiety   . Recurrent boils     Bilateral Groin  . Fibromyalgia   . Rheumatoid arthritis     Per Patient Report; associated with OA  . H/O: GI bleed   . PAF (paroxysmal atrial fibrillation)     By report - no recurrence documented in chart  . PAD (peripheral artery disease) 08/2013    LEA Dopplers to be read by Dr. Fletcher Anon   Past Surgical History  Procedure Laterality Date  . Laparoscopic gastric banding  04/2004; 10/'09, 2/'10    Port Replacement x 2  . Catheter removal    . Coronary artery bypass graft  1995    LIMA-LAD, SVG-RPDA, SVG-D1  . Kidney transplant  1991  . Wrist fistula repair    . Tubal ligation    . Cardiac catheterization  5/'01, 3/'02, 8/'03, 10/'04; 1/'15    08/22/2013: LAD & RCA 100%; LIMA-LAD & SVG-rPDA patent; Cx-- OM1 60%, OM2 ostial ~  50%; SVG-D1 - 80% mid, 50% distal ISR --PCI  . Port-a-cath removal      kidney  . Incise and drain abcess    . Cardiac catheterization  5/'01, 3/'02, 8/'03, 10/'04; 1/'15    08/22/2013: LAD & RCA 100%; LIMA-LAD & SVG-rPDA patent; Cx-- OM1 60%, OM2 ostial ~50%; SVG-D1 - 80% mid, 50% distal ISR --PCI  . Percutaneous coronary stent intervention (pci-s)  5/'01, 3/'02, 8/'03, 10/'04;    '01 - S660 BMS 2.5 x 9 - dSVG-D1 into D1; '02- post-stent stenosis - 2.5 x 8 Pixel BMS; '8\03: ISR/Thrombosis into native D1 - AngioJet, 2.5 x 13 Pixel; '04 - ISR 95% - covered stented area with Taxus DES 2.5 mm x 20 (2.88)  . Percutaneous coronary stent intervention (pci-s)  08/22/2013    mid SVG-D1 80%; distal stent ~50% ISR; Promus Prermier DES 2.75 mm xc 20 mm (2.8 mm)  . Transthoracic echocardiogram  02/2013    NL LV Size, EF 55-60%; MAC with Mild MR.  . Lower extremity arterial dopplers  08/2013    ABI: R 0.96, L 1.04   Family History   Problem Relation Age of Onset  . Cancer Mother     liver  . Heart disease Father   . Cancer Father     colon   History  Substance Use Topics  . Smoking status: Former Smoker -- 1.00 packs/day for 30 years    Types: Cigarettes    Quit date: 08/17/2002  . Smokeless tobacco: Never Used  . Alcohol Use: No   OB History   Grav Para Term Preterm Abortions TAB SAB Ect Mult Living                 Review of Systems  Constitutional: Positive for fatigue.  Respiratory: Positive for cough. Negative for shortness of breath.   Cardiovascular: Negative for chest pain.  Gastrointestinal: Negative for abdominal pain.  Neurological: Positive for headaches.  All other systems reviewed and are negative.      Allergies  Codeine; Erythromycin; Hydromorphone hcl; Nalbuphine; Niaspan; Sulfonamide derivatives; and Tetracycline  Home Medications   Current Outpatient Rx  Name  Route  Sig  Dispense  Refill  . aspirin EC 81 MG tablet   Oral   Take 81 mg by mouth daily.         [name] Kitchen azaTHIOprine (IMURAN) 50 MG tablet   Oral   Take 125 mg by mouth daily.         . buprenorphine (BUTRANS) 20 MCG/HR PTWK   Transdermal   Place 20 mcg onto the skin once a week.          . calcitRIOL (ROCALTROL) 0.25 MCG capsule   Oral   Take 0.25 mcg by mouth daily.         . Calcium Citrate-Vitamin D (CITRACAL + D PO)   Oral   Take by mouth daily.         . clopidogrel (PLAVIX) 75 MG tablet      TAKE 1 TABLET BY MOUTH EVERY DAY   30 tablet   2   . cyclobenzaprine (FLEXERIL) 10 MG tablet   Oral   Take 10 mg by mouth 3 (three) times daily.          . DULoxetine (CYMBALTA) 30 MG capsule   Oral   Take 30 mg by mouth daily.           . fluticasone (FLONASE) 50 MCG/ACT nasal spray  PLACE 2 SPRAYS INTO THE NOSE DAILY.   8.5 g   2   . HYDROcodone-acetaminophen (NORCO/VICODIN) 5-325 MG per tablet   Oral   Take 2 tablets by mouth 2 (two) times daily. Take every day per patient          . LORazepam (ATIVAN) 1 MG tablet   Oral   Take 1 mg by mouth 2 (two) times daily.          . metoprolol tartrate (LOPRESSOR) 25 MG tablet   Oral   Take 12.5 mg by mouth 2 (two) times daily. Take 1/2 tablet  twice a day         . montelukast (SINGULAIR) 10 MG tablet      TAKE 1 TABLET ONCE A DAY   30 tablet   3   . nitroGLYCERIN (NITROSTAT) 0.4 MG SL tablet   Sublingual   Place 1 tablet (0.4 mg total) under the tongue every 5 (five) minutes as needed for chest pain.   25 tablet   6   . omeprazole (PRILOSEC) 40 MG capsule   Oral   Take 40 mg by mouth daily.           [name] Kitchen PATANOL 0.1 % ophthalmic solution   Both Eyes   Place 1 drop into both eyes daily.          . predniSONE (DELTASONE) 5 MG tablet   Oral   Take 5 mg by mouth daily.           . promethazine (PHENERGAN) 25 MG tablet   Oral   Take 25 mg by mouth every 6 (six) hours as needed. For nausea and sleep         . rosuvastatin (CRESTOR) 20 MG tablet   Oral   Take 1 tablet (20 mg total) by mouth daily.   30 tablet   6   . sertraline (ZOLOFT) 100 MG tablet   Oral   Take 200 mg by mouth at bedtime.         . traMADol (ULTRAM) 50 MG tablet   Oral   Take 50 mg by mouth 3 (three) times daily as needed. For pain         . amoxicillin-clavulanate (AUGMENTIN) 875-125 MG per tablet   Oral   Take 1 tablet by mouth 2 (two) times daily. Completed back in 08-31-13          BP 140/80  Pulse 65  Temp(Src) 98.3 F (36.8 C) (Oral)  Resp 11  Ht 5\' 1"  (1.549 m)  Wt 185 lb (83.915 kg)  BMI 34.97 kg/m2  SpO2 98% Physical Exam CONSTITUTIONAL: Well developed/well nourished HEAD: Normocephalic/atraumatic EYES: EOMI/PERRL ENMT: Mucous membranes moist NECK: supple no meningeal signs SPINE:entire spine nontender CV: S1/S2 noted, no murmurs/rubs/gallops noted LUNGS: Lungs are clear to auscultation bilaterally, no apparent distress ABDOMEN: soft, nontender, no rebound or guarding GU:no cva  tenderness NEURO: Pt is awake/alert, moves all extremitiesx4, no arm/leg drift is noted EXTREMITIES: pulses normal, full ROM SKIN: warm, color normal PSYCH: no abnormalities of mood noted  ED Course  Procedures   1:19 AM Pt here for nausea/vomiting Clinically stable at this time Labs/imaging ordered due to her previous complex history   At time of discharge, Pt stable, resting comfortably Tolerating PO Labs/imaging reassuring I feel she is safe/stable for d/c home BP 121/74  Pulse 91  Temp(Src) 98.3 F (36.8 C) (Oral)  Resp 11  Ht 5\' 1"  (1.549 m)  Wt 185  lb (83.915 kg)  BMI 34.97 kg/m2  SpO2 94%   Labs Review Labs Reviewed  URINE CULTURE  BASIC METABOLIC PANEL  CBC WITH DIFFERENTIAL  URINALYSIS, ROUTINE W REFLEX MICROSCOPIC   Imaging Review Dg Chest 2 View  10/16/2013   CLINICAL DATA:  Persistent cough and nausea.  EXAM: CHEST  2 VIEW  COMPARISON:  DG CHEST 2 VIEW dated 08/22/2013  FINDINGS: Cardiac silhouette is nonsuspicious, status post median sternotomy for apparent coronary artery bypass grafting with Coronary artery stent in place. Linear scarring in left midlung zone is unchanged. Multiple fractured median sternotomy wires. No pleural effusions or focal consolidations. No pneumothorax.  Lap band in place. Moderate degenerative change of the thoracic spine.  IMPRESSION: No active cardiopulmonary disease.   Electronically Signed   By: Elon Alas   On: 10/16/2013 01:15     EKG Interpretation   Date/Time:  Sunday October 15 2013 20:49:25 EST Ventricular Rate:  63 PR Interval:  148 QRS Duration: 96 QT Interval:  419 QTC Calculation: 429 R Axis:   -19 Text Interpretation:  Sinus rhythm Borderline left axis deviation  Nonspecific T abnormalities, lateral leads Confirmed by Christy Gentles  MD,  Elenore Rota ([id number]) on 10/15/2013 11:30:38 PM      MDM   Final diagnoses:  Nausea and vomiting    Nursing notes including past medical history and social history reviewed  and considered in documentation Labs/vital reviewed and considered xrays reviewed and considered     Sharyon Cable, MD 10/16/13 [phone]

## 2013-10-16 NOTE — Discharge Instructions (Signed)

## 2013-10-17 ENCOUNTER — Other Ambulatory Visit: Payer: Self-pay | Admitting: Cardiology

## 2013-10-17 LAB — URINE CULTURE: Colony Count: 8000

## 2013-10-17 NOTE — Telephone Encounter (Signed)
Rx was sent to pharmacy electronically. 

## 2013-11-05 ENCOUNTER — Other Ambulatory Visit: Payer: Self-pay | Admitting: Emergency Medicine

## 2013-11-06 ENCOUNTER — Other Ambulatory Visit: Payer: Self-pay

## 2013-11-06 MED ORDER — CLOPIDOGREL BISULFATE 75 MG PO TABS: 75.0000 mg | ORAL_TABLET | Freq: Every day | ORAL | Status: DC

## 2013-11-06 NOTE — Telephone Encounter (Signed)
Rx was sent to pharmacy electronically. 

## 2013-11-14 DIAGNOSIS — F411 Generalized anxiety disorder: Secondary | ICD-10-CM | POA: Diagnosis not present

## 2013-11-14 DIAGNOSIS — M19049 Primary osteoarthritis, unspecified hand: Secondary | ICD-10-CM | POA: Diagnosis not present

## 2013-11-14 DIAGNOSIS — IMO0001 Reserved for inherently not codable concepts without codable children: Secondary | ICD-10-CM | POA: Diagnosis not present

## 2013-11-14 DIAGNOSIS — M7989 Other specified soft tissue disorders: Secondary | ICD-10-CM | POA: Diagnosis not present

## 2013-11-15 ENCOUNTER — Encounter (INDEPENDENT_AMBULATORY_CARE_PROVIDER_SITE_OTHER): Payer: Medicare Other | Admitting: General Surgery

## 2013-12-21 ENCOUNTER — Encounter (INDEPENDENT_AMBULATORY_CARE_PROVIDER_SITE_OTHER): Payer: Medicare Other

## 2013-12-25 DIAGNOSIS — Z940 Kidney transplant status: Secondary | ICD-10-CM | POA: Diagnosis not present

## 2013-12-25 DIAGNOSIS — I129 Hypertensive chronic kidney disease with stage 1 through stage 4 chronic kidney disease, or unspecified chronic kidney disease: Secondary | ICD-10-CM | POA: Diagnosis not present

## 2013-12-25 DIAGNOSIS — D631 Anemia in chronic kidney disease: Secondary | ICD-10-CM | POA: Diagnosis not present

## 2013-12-25 DIAGNOSIS — N182 Chronic kidney disease, stage 2 (mild): Secondary | ICD-10-CM | POA: Diagnosis not present

## 2013-12-26 ENCOUNTER — Encounter: Payer: Medicare Other | Admitting: Cardiovascular Disease

## 2014-01-02 ENCOUNTER — Encounter (INDEPENDENT_AMBULATORY_CARE_PROVIDER_SITE_OTHER): Payer: Medicare Other | Admitting: General Surgery

## 2014-01-16 ENCOUNTER — Encounter: Payer: Medicare Other | Admitting: Cardiovascular Disease

## 2014-01-18 ENCOUNTER — Other Ambulatory Visit: Payer: Medicare Other

## 2014-01-18 ENCOUNTER — Encounter (INDEPENDENT_AMBULATORY_CARE_PROVIDER_SITE_OTHER): Payer: Medicare Other | Admitting: General Surgery

## 2014-01-28 ENCOUNTER — Other Ambulatory Visit: Payer: Self-pay | Admitting: Emergency Medicine

## 2014-01-29 ENCOUNTER — Telehealth: Payer: Self-pay | Admitting: Emergency Medicine

## 2014-01-29 ENCOUNTER — Other Ambulatory Visit: Payer: Medicare Other

## 2014-01-29 MED ORDER — DOXYCYCLINE HYCLATE 100 MG PO TABS: 100.0000 mg | ORAL_TABLET | Freq: Two times a day (BID) | ORAL | Status: DC

## 2014-01-29 NOTE — Telephone Encounter (Signed)
Spoke with pt and she states that's she's having a cough with yellow, chest tightness, sob and wheezing x6 days. Pt taking mucinex and flonase with no relief. Pt requesting an antibiotic sent to her pharm to avoid pna. RB please advised, thanks

## 2014-01-29 NOTE — Telephone Encounter (Signed)
Spoke with pt and advise her that antibiotics. Pt will call back when feeling better to schedule appt to re-establish

## 2014-01-29 NOTE — Telephone Encounter (Signed)
I haven't seen her in 2 years. We can give her abx to treat possible bronchitis but she needs an OV with me to re-establish. Give doxy 100 bid x 7 days.

## 2014-02-07 ENCOUNTER — Other Ambulatory Visit: Payer: Medicare Other

## 2014-02-15 ENCOUNTER — Encounter (INDEPENDENT_AMBULATORY_CARE_PROVIDER_SITE_OTHER): Payer: Medicare Other

## 2014-02-19 ENCOUNTER — Other Ambulatory Visit: Payer: Medicare Other

## 2014-02-22 ENCOUNTER — Other Ambulatory Visit: Payer: Self-pay | Admitting: Emergency Medicine

## 2014-02-26 ENCOUNTER — Other Ambulatory Visit: Payer: Self-pay

## 2014-02-26 NOTE — Telephone Encounter (Signed)
Prescription denied. Patient must request medication from primary care physician.

## 2014-02-28 ENCOUNTER — Other Ambulatory Visit: Payer: Self-pay | Admitting: *Deleted

## 2014-02-28 NOTE — Telephone Encounter (Signed)
Rx refill denied to patient pharmacy

## 2014-03-01 ENCOUNTER — Encounter (INDEPENDENT_AMBULATORY_CARE_PROVIDER_SITE_OTHER): Payer: Medicare Other

## 2014-03-07 ENCOUNTER — Ambulatory Visit
Admission: RE | Admit: 2014-03-07 | Discharge: 2014-03-07 | Disposition: A | Discharge status: Home / Self Care | Payer: Medicare Other | Source: Ambulatory Visit | Attending: Pain Medicine | Admitting: Pain Medicine

## 2014-03-07 ENCOUNTER — Ambulatory Visit (INDEPENDENT_AMBULATORY_CARE_PROVIDER_SITE_OTHER): Payer: Medicare Other | Admitting: General Surgery

## 2014-03-07 ENCOUNTER — Encounter (INDEPENDENT_AMBULATORY_CARE_PROVIDER_SITE_OTHER): Payer: Self-pay | Admitting: General Surgery

## 2014-03-07 VITALS — BP 138/86 | HR 76 | Temp 98.9°F | Resp 15 | Ht 61.0 in | Wt 204.0 lb

## 2014-03-07 DIAGNOSIS — Z9884 Bariatric surgery status: Secondary | ICD-10-CM | POA: Diagnosis not present

## 2014-03-07 DIAGNOSIS — Z4651 Encounter for fitting and adjustment of gastric lap band: Secondary | ICD-10-CM

## 2014-03-07 DIAGNOSIS — M47817 Spondylosis without myelopathy or radiculopathy, lumbosacral region: Secondary | ICD-10-CM | POA: Diagnosis not present

## 2014-03-07 DIAGNOSIS — M5126 Other intervertebral disc displacement, lumbar region: Secondary | ICD-10-CM | POA: Diagnosis not present

## 2014-03-07 DIAGNOSIS — M545 Low back pain, unspecified: Secondary | ICD-10-CM

## 2014-03-07 MED ORDER — GADOBENATE DIMEGLUMINE 529 MG/ML IV SOLN
17.0000 mL | Freq: Once | INTRAVENOUS | Status: AC | PRN
  Administered 2014-03-07: 17 mL via INTRAVENOUS

## 2014-03-07 NOTE — Progress Notes (Signed)
Chief complaint: Followup lap and  History: The patient returns for followup of her lap band placed 2005. In recent months she has had some teasing of restriction and has gained about 29 pounds. She is still 75 pounds below her preoperative weight. She denies any significant reflux or regurgitation.. She has had a history of esophageal dilatation and some chronic respiratory infection secondary to this and reflux but denies any other symptoms currently. She is eating well more than a cup of food at a setting.  pmh Past Surgical History  Procedure Laterality Date  . Laparoscopic gastric banding  04/2004; 10/'09, 2/'10    Port Replacement x 2  . Catheter removal    . Coronary artery bypass graft  1995    LIMA-LAD, SVG-RPDA, SVG-D1  . Kidney transplant  1991  . Wrist fistula repair    . Tubal ligation    . Cardiac catheterization  5/'01, 3/'02, 8/'03, 10/'04; 1/'15    08/22/2013: LAD & RCA 100%; LIMA-LAD & SVG-rPDA patent; Cx-- OM1 60%, OM2 ostial ~50%; SVG-D1 - 80% mid, 50% distal ISR --PCI  . Port-a-cath removal      kidney  . Incise and drain abcess    . Cardiac catheterization  5/'01, 3/'02, 8/'03, 10/'04; 1/'15    08/22/2013: LAD & RCA 100%; LIMA-LAD & SVG-rPDA patent; Cx-- OM1 60%, OM2 ostial ~50%; SVG-D1 - 80% mid, 50% distal ISR --PCI  . Percutaneous coronary stent intervention (pci-s)  5/'01, 3/'02, 8/'03, 10/'04;    '01 - S660 BMS 2.5 x 9 - dSVG-D1 into D1; '02- post-stent stenosis - 2.5 x 8 Pixel BMS; '8\03: ISR/Thrombosis into native D1 - AngioJet, 2.5 x 13 Pixel; '04 - ISR 95% - covered stented area with Taxus DES 2.5 mm x 20 (2.88)  . Percutaneous coronary stent intervention (pci-s)  08/22/2013    mid SVG-D1 80%; distal stent ~50% ISR; Promus Prermier DES 2.75 mm xc 20 mm (2.8 mm)  . Transthoracic echocardiogram  02/2013    NL LV Size, EF 55-60%; MAC with Mild MR.  . Lower extremity arterial dopplers  08/2013    ABI: R 0.96, L 1.04   Current Outpatient Prescriptions  Medication Sig  Dispense Refill  . aspirin EC 81 MG tablet Take 81 mg by mouth daily.      [name] Kitchen azaTHIOprine (IMURAN) 50 MG tablet Take 125 mg by mouth daily.      . buprenorphine (BUTRANS) 20 MCG/HR PTWK Place 20 mcg onto the skin once a week.       . calcitRIOL (ROCALTROL) 0.25 MCG capsule Take 0.25 mcg by mouth daily.      . Calcium Citrate-Vitamin D (CITRACAL + D PO) Take by mouth daily.      . clopidogrel (PLAVIX) 75 MG tablet Take 1 tablet (75 mg total) by mouth daily.  30 tablet  10  . cyclobenzaprine (FLEXERIL) 10 MG tablet Take 10 mg by mouth 3 (three) times daily.       . DULoxetine (CYMBALTA) 30 MG capsule Take 30 mg by mouth daily.        . fluticasone (FLONASE) 50 MCG/ACT nasal spray PLACE 2 SPRAYS INTO THE NOSE DAILY  16 g  2  . HYDROcodone-acetaminophen (NORCO/VICODIN) 5-325 MG per tablet Take 2 tablets by mouth 2 (two) times daily. Take every day per patient      . LORazepam (ATIVAN) 1 MG tablet Take 1 mg by mouth 2 (two) times daily.       . metoprolol tartrate (LOPRESSOR) 25  MG tablet Take 12.5 mg by mouth 2 (two) times daily. Take 1/2 tablet  twice a day      . montelukast (SINGULAIR) 10 MG tablet TAKE 1 TABLET ONCE A DAY  30 tablet  3  . nitroGLYCERIN (NITROSTAT) 0.4 MG SL tablet Place 1 tablet (0.4 mg total) under the tongue every 5 (five) minutes as needed for chest pain.  25 tablet  6  . omeprazole (PRILOSEC) 40 MG capsule Take 40 mg by mouth daily.        [name] Kitchen PATANOL 0.1 % ophthalmic solution Place 1 drop into both eyes daily.       . predniSONE (DELTASONE) 5 MG tablet Take 5 mg by mouth daily.        . promethazine (PHENERGAN) 25 MG tablet Take 25 mg by mouth every 6 (six) hours as needed. For nausea and sleep      . rosuvastatin (CRESTOR) 20 MG tablet Take 1 tablet (20 mg total) by mouth daily.  30 tablet  6  . sertraline (ZOLOFT) 100 MG tablet Take 200 mg by mouth at bedtime.      . traMADol (ULTRAM) 50 MG tablet Take 50 mg by mouth 3 (three) times daily as needed. For pain       No  current facility-administered medications for this visit.   Allergies  Allergen Reactions  . Codeine Nausea And Vomiting  . Erythromycin Nausea And Vomiting  . Hydromorphone Hcl Nausea And Vomiting  . Nalbuphine Nausea And Vomiting  . Niaspan [Niacin Er] Other (See Comments)    unknown  . Sulfonamide Derivatives Other (See Comments)    Reaction: per patient "tears her stomach up"  . Tetracycline Hives   Exam: BP 138/86  Pulse 76  Temp(Src) 98.9 F (37.2 C) (Oral)  Resp 15  Ht 5\' 1"  (1.549 m)  Wt 204 lb (92.534 kg)  BMI 38.57 kg/m2 Total weight loss 74 pounds, a 29 pounds from last fall General: Overweight otherwise well appearing Lungs: Clear to auscultation bilaterally Abdomen: Soft and nontender. Port site looks fine.  Assessment and plan: Status post lap band with overall excellent weight loss but some weight regain and lack of restriction. After discussion today we went ahead and added 0.25 cc to her band without difficulty that should bring her up to 2.25 cc. She was able to tolerate water well. We discussed diet and exercise strategies. Her husband has been going to a difficult time with head and neck cancer which is probably interfered with her normal routine. I'll plan to see her back in 4 months.

## 2014-04-02 ENCOUNTER — Telehealth (INDEPENDENT_AMBULATORY_CARE_PROVIDER_SITE_OTHER): Payer: Self-pay

## 2014-04-02 DIAGNOSIS — L03317 Cellulitis of buttock: Principal | ICD-10-CM

## 2014-04-02 DIAGNOSIS — L0231 Cutaneous abscess of buttock: Secondary | ICD-10-CM

## 2014-04-02 NOTE — Telephone Encounter (Signed)
Pt called in stating she believes she has a new boil starting to pop up and is asking if Dr Excell Seltzer would call in an antibiotic. Advised he is unavailable today but will be back tomorrow. Offered urgent office appt but she declined it. She would rather wait until tomorrow to she if Dr Excell Seltzer would ok [place] to call it in so she doesn't have to come in for a visit. We will call her back once we hear from Dr Excell Seltzer.

## 2014-04-03 MED ORDER — DOXYCYCLINE HYCLATE 100 MG PO TABS: 100.0000 mg | ORAL_TABLET | Freq: Two times a day (BID) | ORAL | Status: DC

## 2014-04-03 NOTE — Telephone Encounter (Signed)
Pt aware script sent to pharmacy. 

## 2014-04-03 NOTE — Telephone Encounter (Signed)
OK to call in doxycycline 100 mg bid # 20

## 2014-04-03 NOTE — Addendum Note (Signed)
Addended by: [name] Coria on: 04/03/2014 04:28 PM   Modules accepted: Orders

## 2014-04-12 ENCOUNTER — Other Ambulatory Visit: Payer: Self-pay

## 2014-04-12 MED ORDER — CLOPIDOGREL BISULFATE 75 MG PO TABS: 75.0000 mg | ORAL_TABLET | Freq: Every day | ORAL | Status: DC

## 2014-04-12 NOTE — Telephone Encounter (Signed)
Rx was sent to pharmacy electronically. 

## 2014-04-19 ENCOUNTER — Telehealth (HOSPITAL_COMMUNITY): Payer: Self-pay | Admitting: *Deleted

## 2014-05-04 ENCOUNTER — Other Ambulatory Visit (HOSPITAL_COMMUNITY): Payer: Self-pay | Admitting: Cardiology

## 2014-05-04 DIAGNOSIS — I6529 Occlusion and stenosis of unspecified carotid artery: Secondary | ICD-10-CM

## 2014-05-11 DIAGNOSIS — I129 Hypertensive chronic kidney disease with stage 1 through stage 4 chronic kidney disease, or unspecified chronic kidney disease: Secondary | ICD-10-CM | POA: Diagnosis not present

## 2014-05-11 DIAGNOSIS — N390 Urinary tract infection, site not specified: Secondary | ICD-10-CM | POA: Diagnosis not present

## 2014-05-11 DIAGNOSIS — Z940 Kidney transplant status: Secondary | ICD-10-CM | POA: Diagnosis not present

## 2014-05-11 DIAGNOSIS — E785 Hyperlipidemia, unspecified: Secondary | ICD-10-CM | POA: Diagnosis not present

## 2014-05-11 DIAGNOSIS — N039 Chronic nephritic syndrome with unspecified morphologic changes: Secondary | ICD-10-CM | POA: Diagnosis not present

## 2014-05-11 DIAGNOSIS — D631 Anemia in chronic kidney disease: Secondary | ICD-10-CM | POA: Diagnosis not present

## 2014-05-11 DIAGNOSIS — N2581 Secondary hyperparathyroidism of renal origin: Secondary | ICD-10-CM | POA: Diagnosis not present

## 2014-05-11 DIAGNOSIS — N182 Chronic kidney disease, stage 2 (mild): Secondary | ICD-10-CM | POA: Diagnosis not present

## 2014-05-11 DIAGNOSIS — Z23 Encounter for immunization: Secondary | ICD-10-CM | POA: Diagnosis not present

## 2014-05-22 ENCOUNTER — Inpatient Hospital Stay (HOSPITAL_COMMUNITY): Admission: RE | Admit: 2014-05-22 | Payer: Medicaid Other | Source: Ambulatory Visit

## 2014-05-28 ENCOUNTER — Encounter (HOSPITAL_COMMUNITY): Payer: Self-pay | Admitting: *Deleted

## 2014-06-04 DIAGNOSIS — M24549 Contracture, unspecified hand: Secondary | ICD-10-CM | POA: Diagnosis not present

## 2014-06-05 ENCOUNTER — Telehealth: Payer: Self-pay | Admitting: Cardiology

## 2014-06-05 NOTE — Telephone Encounter (Signed)
Pt. Called and informed if she was still having tomorrow to fill out a yellow sheet when she arrives

## 2014-06-05 NOTE — Telephone Encounter (Signed)
Pt called in stating that she has been having some palpitations for the past 2 wks and would like to know if she could be seen tomorrow when she comes in for her doppler . Please call  Thanks

## 2014-06-06 ENCOUNTER — Telehealth: Payer: Self-pay | Admitting: *Deleted

## 2014-06-06 ENCOUNTER — Ambulatory Visit (INDEPENDENT_AMBULATORY_CARE_PROVIDER_SITE_OTHER): Payer: Medicare Other | Admitting: *Deleted

## 2014-06-06 VITALS — BP 137/92 | HR 96 | Ht 60.0 in | Wt 190.0 lb

## 2014-06-06 DIAGNOSIS — R002 Palpitations: Secondary | ICD-10-CM

## 2014-06-06 DIAGNOSIS — R0602 Shortness of breath: Secondary | ICD-10-CM

## 2014-06-06 IMAGING — CR DG CHEST 2V
2 series · 2 of 2 positions shown · non-contrast
Comparison: 07/01/2012

CLINICAL DATA: Cough, congestion, intermittent fever

EXAM:
CHEST  2 VIEW

[view not recorded (1 of 2)]
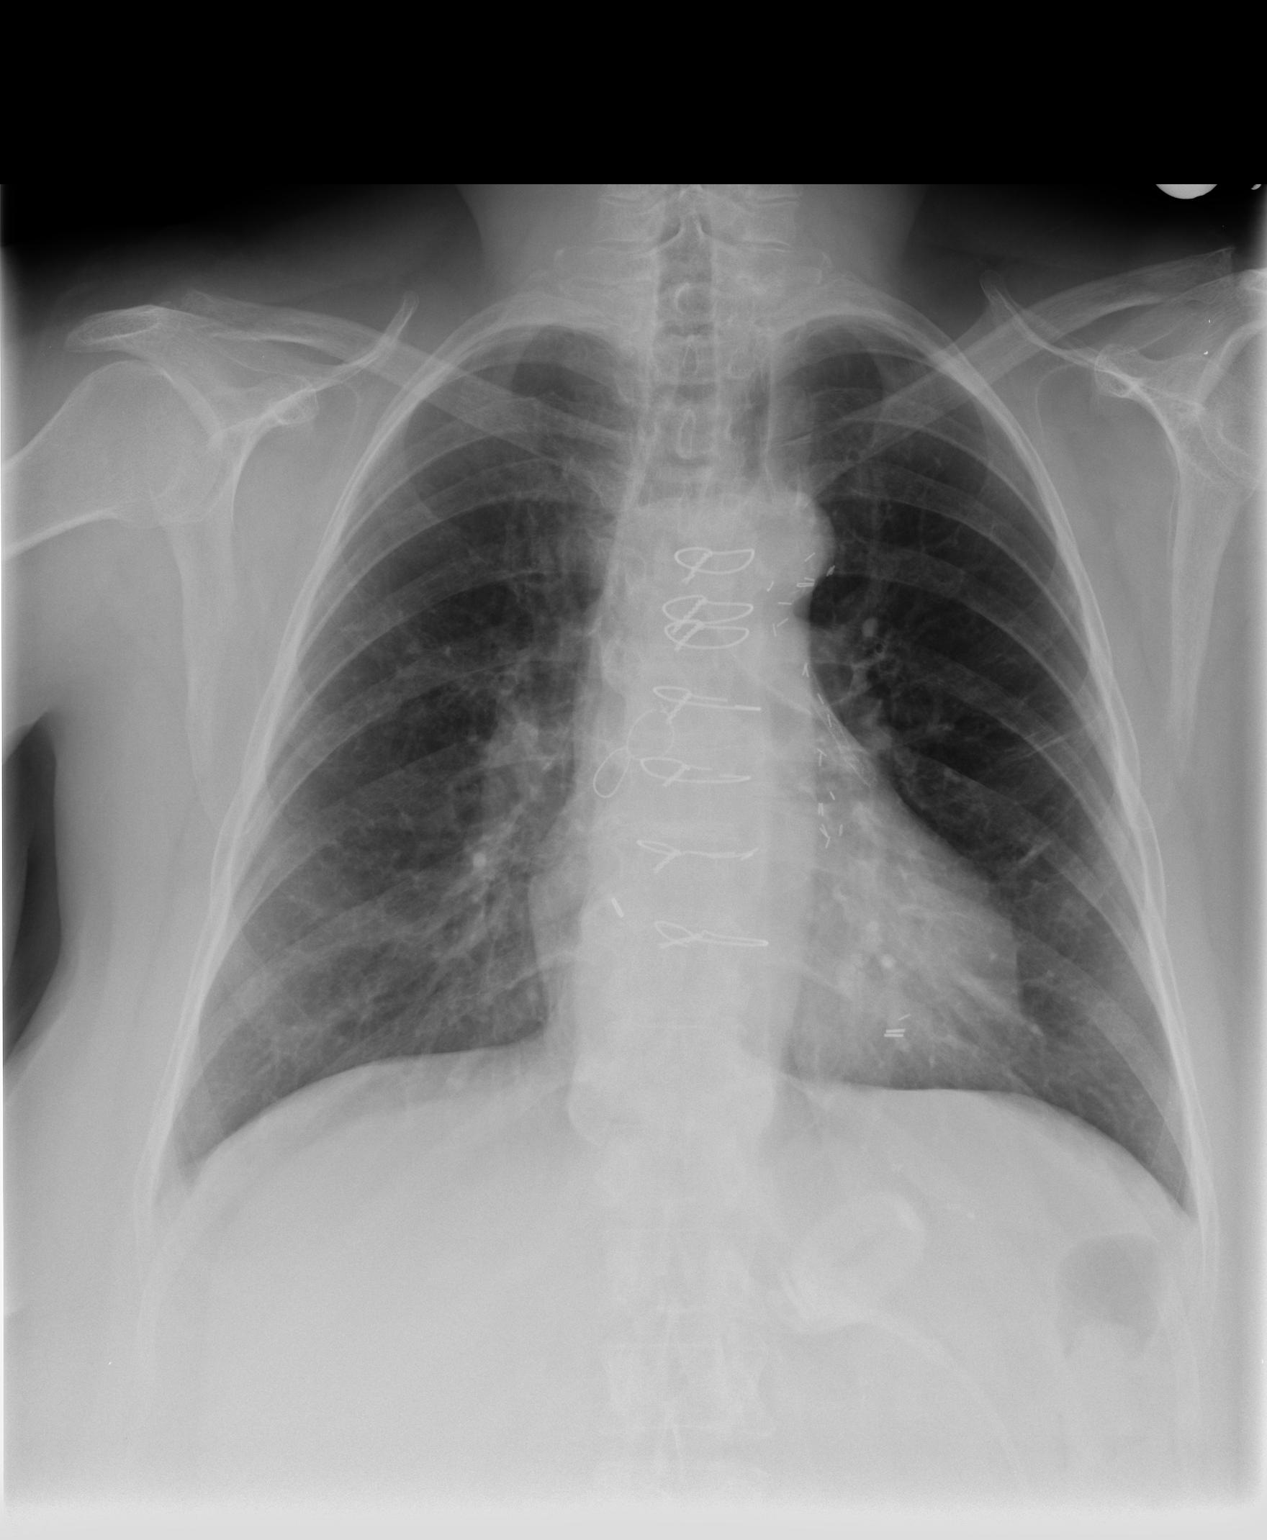

[view not recorded (2 of 2)]
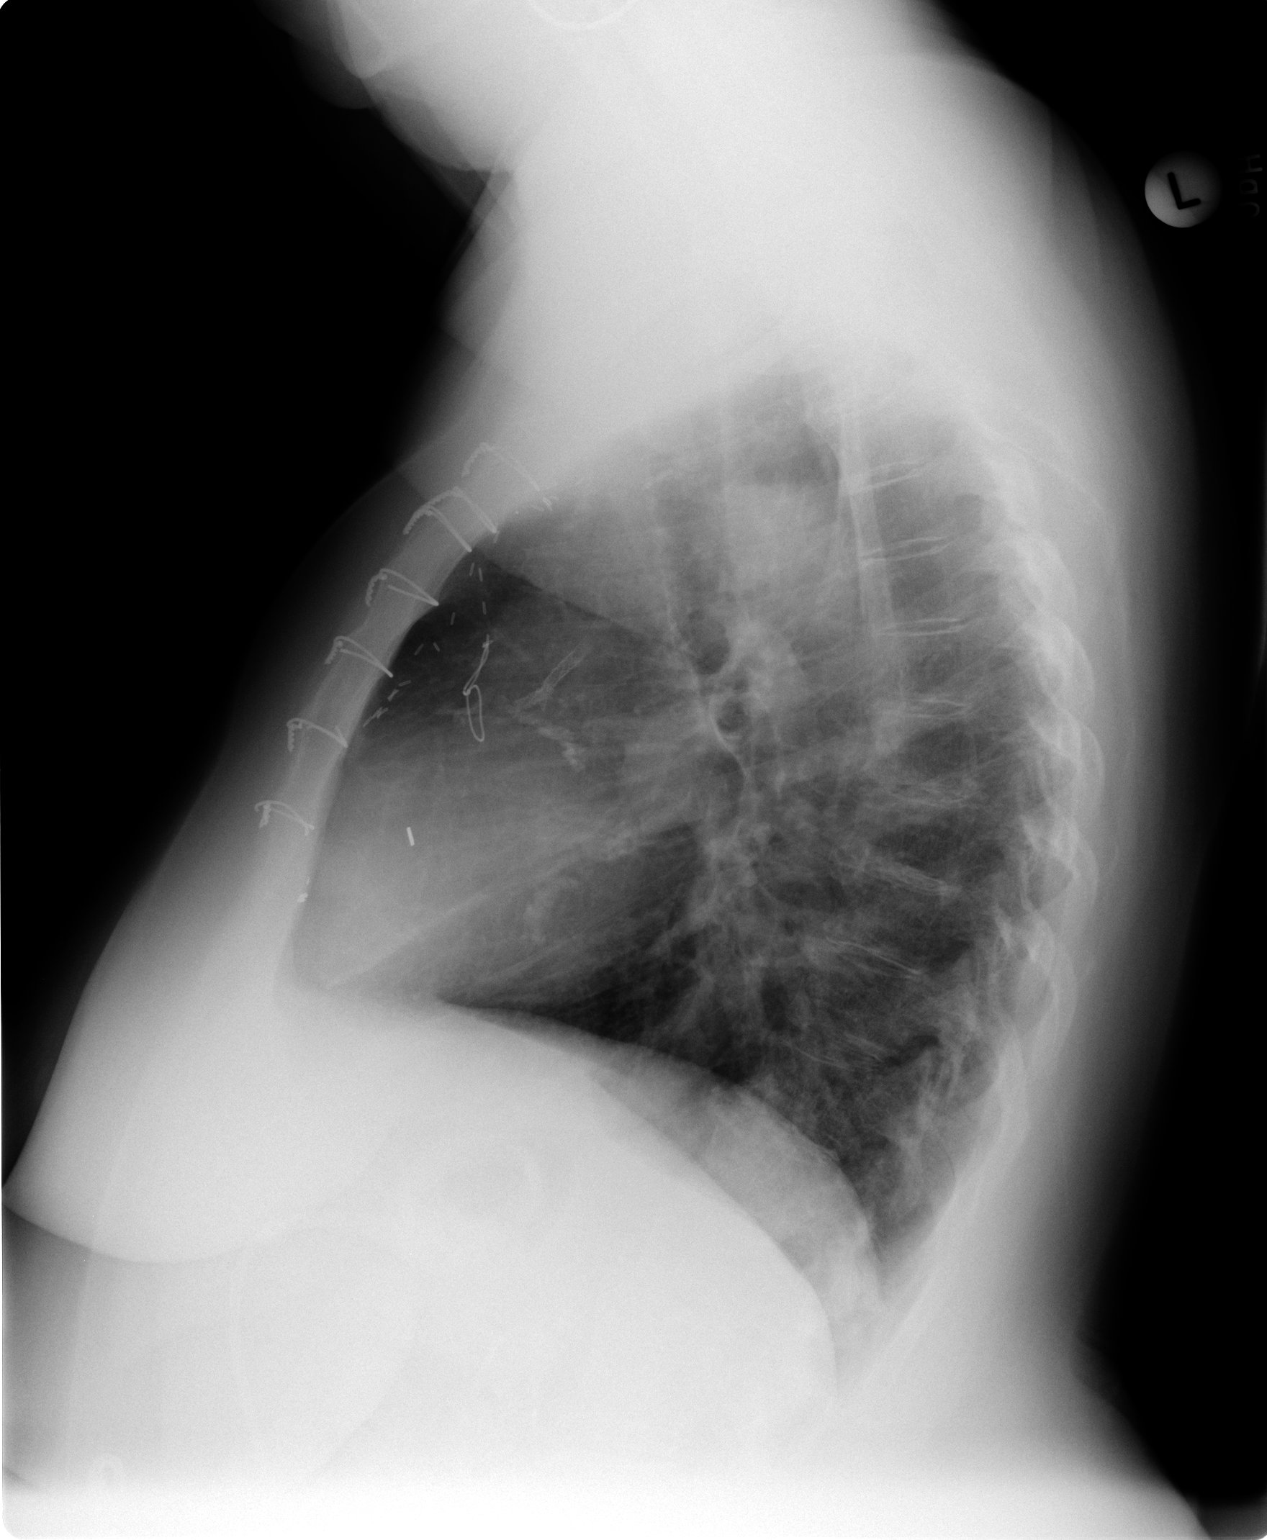

[2 of 2 positions shown; findings below may reference images not displayed]

FINDINGS: Mild cardiac enlargement. Status post median sternotomy. Vascular
pattern normal. No infiltrate or effusion.
IMPRESSION: No acute findings.

## 2014-06-06 NOTE — Telephone Encounter (Signed)
Patient walked in complaining of palpitations x3 weeks, SOB, lightheadedness, dizziness (per yellow sheet)  In discussing symptoms with patient her symptoms, she states that she had been Rx'ed lorazepam 1mg  BID by Dr. [name]. After he retired, she had gotten then from her rheumatologist, who then released her from care as she states she did not want to take the pain medications they recommended and wanted to recommend her to a pain clinic. She then reports that Dr. Jimmy Footman, nephrologist, gave her 1mg  lorazepam daily, at which time she noticed her palpitations to start again. She states that she does not take her metoprolol tartrate as prescribed and takes how Dr. [name] advised to her, which is to take her BP daily and take this medications as she feels needed. She reports this information was not disclosed to Dr. Ellyn Hack and this was not advised by him. She reports her nerves are shot and she has an adopted 49 year old son. She states she takes cyclobenzaprine as prescribed for fibromyalgia.   Talked with Dr. Sallyanne Kuster and he advised that she take metoprolol as prescribed 12.5 mg twice daily by MD and make first available appointment.   BP 137/92 HR 92    Patient complaints of skin itching and coughing all the time.  Patient states she called Dr. Jimmy Footman about palpitations and reports that Dr. Jimmy Footman decreased cymbalta and told her take 1/2 tablet of metoprolol at night.  Patient reports her legs feel like jello.   EKG was performed and an EKG visit was created to document appropriately.

## 2014-06-06 NOTE — Progress Notes (Signed)
Copied from telephone encounter from 06/06/14:  Patient walked in complaining of palpitations x3 weeks, SOB, lightheadedness, dizziness (per yellow sheet). She thought she had a carotid doppler study today which is why she was advised yesterday by another clinic member to fill out yellow sheet when she came in for test.   In discussing symptoms with patient her symptoms, she states that she had been Rx'ed lorazepam 1mg  BID by Dr. [name]. After he retired, she had gotten then from her rheumatologist, who then released her from care as she states she did not want to take the pain medications they recommended and wanted to refer her to a pain clinic. She then reports that Dr. Jimmy Footman, nephrologist, gave her 1mg  lorazepam daily, at which time she noticed her palpitations to start again. She states that she does not take her metoprolol tartrate as prescribed and takes how Dr. [name] advised to her, which is to take her BP daily and take this medications as she feels needed. She reports this information was not disclosed to Dr. Ellyn Hack and this was not advised by him. She reports her nerves are shot and she has an adopted 36 year old son. She states she takes cyclobenzaprine as prescribed for fibromyalgia. She reports she has home O2 and used it yesterday. Patient states she does not know why an doctor cannot give her a Rx for lorazepam as previously prescribed by Dr. [name].   Talked with Dr. Sallyanne Kuster and he advised that she take metoprolol as prescribed 12.5 mg twice daily by MD and make first available appointment. She was advised that cardiologist tend NOT to prescribed anxiolytics and it was recommended that she obtain a PCP to manage these symptoms and prescribe her lorazepam.   BP 137/92 HR 92    When advice of taking metoprolol as prescribed was communicated to patient she denoted the symptoms below as well and an EKG was offered to make patient feel better about her palpitations. >> Patient  complaints of skin itching and coughing all the time. Patient reports her legs feel like jello  Patient states she called Dr. Jimmy Footman about palpitations and reports that Dr. Jimmy Footman decreased cymbalta and told her take 1/2 tablet of metoprolol at night.   EKG was performed and an EKG visit encounter was created to document appropriately.  Dr. Sallyanne Kuster reviewed EKG and compared to EKG from Jan 2015 OV with Dr. Ellyn Hack >> noted NO changes.  This information was communicated to patient.  She was advised to reschedule her carotid doppler study and schedule an appointment with Dr. Ellyn Hack or APP  Patient is scheduled for carotid doppler on 11/5 and to see Lurena Joiner, [place] on same day.

## 2014-06-07 ENCOUNTER — Encounter (HOSPITAL_COMMUNITY): Payer: Medicare Other

## 2014-06-11 ENCOUNTER — Other Ambulatory Visit: Payer: Self-pay | Admitting: Emergency Medicine

## 2014-06-13 ENCOUNTER — Other Ambulatory Visit: Payer: Self-pay | Admitting: Emergency Medicine

## 2014-06-17 DIAGNOSIS — I480 Paroxysmal atrial fibrillation: Secondary | ICD-10-CM

## 2014-06-17 HISTORY — DX: Paroxysmal atrial fibrillation: I48.0

## 2014-06-20 DIAGNOSIS — Z940 Kidney transplant status: Secondary | ICD-10-CM | POA: Diagnosis not present

## 2014-06-21 ENCOUNTER — Encounter: Payer: Self-pay | Admitting: Cardiology

## 2014-06-21 ENCOUNTER — Telehealth: Payer: Self-pay | Admitting: Cardiology

## 2014-06-21 ENCOUNTER — Ambulatory Visit (HOSPITAL_COMMUNITY)
Admission: RE | Admit: 2014-06-21 | Discharge: 2014-06-21 | Disposition: A | Discharge status: Home / Self Care | Payer: Medicare Other | Source: Ambulatory Visit | Attending: Cardiology | Admitting: Cardiology

## 2014-06-21 ENCOUNTER — Ambulatory Visit (INDEPENDENT_AMBULATORY_CARE_PROVIDER_SITE_OTHER): Payer: Medicare Other | Admitting: Cardiology

## 2014-06-21 VITALS — BP 150/84 | Ht 60.0 in | Wt 183.1 lb

## 2014-06-21 DIAGNOSIS — I498 Other specified cardiac arrhythmias: Secondary | ICD-10-CM | POA: Insufficient documentation

## 2014-06-21 DIAGNOSIS — R5383 Other fatigue: Secondary | ICD-10-CM | POA: Insufficient documentation

## 2014-06-21 DIAGNOSIS — I672 Cerebral atherosclerosis: Secondary | ICD-10-CM | POA: Diagnosis not present

## 2014-06-21 DIAGNOSIS — I6523 Occlusion and stenosis of bilateral carotid arteries: Secondary | ICD-10-CM | POA: Diagnosis not present

## 2014-06-21 DIAGNOSIS — Z951 Presence of aortocoronary bypass graft: Secondary | ICD-10-CM | POA: Diagnosis not present

## 2014-06-21 DIAGNOSIS — I493 Ventricular premature depolarization: Secondary | ICD-10-CM | POA: Diagnosis not present

## 2014-06-21 DIAGNOSIS — G4730 Sleep apnea, unspecified: Secondary | ICD-10-CM | POA: Diagnosis not present

## 2014-06-21 DIAGNOSIS — I6529 Occlusion and stenosis of unspecified carotid artery: Secondary | ICD-10-CM

## 2014-06-21 DIAGNOSIS — R002 Palpitations: Secondary | ICD-10-CM | POA: Diagnosis not present

## 2014-06-21 DIAGNOSIS — T8610 Unspecified complication of kidney transplant: Secondary | ICD-10-CM | POA: Diagnosis not present

## 2014-06-21 MED ORDER — ROSUVASTATIN CALCIUM 20 MG PO TABS: 20.0000 mg | ORAL_TABLET | Freq: Every day | ORAL | Status: DC

## 2014-06-21 NOTE — Assessment & Plan Note (Signed)
On exam and on EKG today

## 2014-06-21 NOTE — Progress Notes (Signed)
06/21/2014 Katherine [name]   January 18, 1956  [id number]  Primary Physician No PCP Per Patient Primary Cardiologist: [name]  HPI:  58 y.o. Female, former patient of Dr. Terance Ice, also followed by Dr Deterding, with a long history of CAD, fibromyalgia, obesity, and anxiety. She had nephritis in the late 80's and was on HD for two years till she received a transplant kidney in 1991. Her last SCr was 0.67 in March 2015.            She presented to the hospital in Jan 2015 with unstable angina/acute coronary syndrome. She underwent cardiac catheterization demonstrating a new finding of mid graft stenosis of the SVG-diagonal. Dr.Arida performed PCI of the mid graft with a Promus Premier stent. She is in the office today with complaints of daytime fatigue, dizziness, and palpitations. She had seen dr Deterding earlier and he suggested she be seen here for further evaluation.    Current Outpatient Prescriptions  Medication Sig Dispense Refill  . aspirin EC 81 MG tablet Take 81 mg by mouth daily.    [name] Kitchen azaTHIOprine (IMURAN) 50 MG tablet Take 125 mg by mouth daily.    . calcitRIOL (ROCALTROL) 0.25 MCG capsule Take 0.25 mcg by mouth daily.    . clopidogrel (PLAVIX) 75 MG tablet Take 1 tablet (75 mg total) by mouth daily. 90 tablet 1  . cyclobenzaprine (FLEXERIL) 10 MG tablet Take 10 mg by mouth 3 (three) times daily.     . DULoxetine (CYMBALTA) 30 MG capsule Take 30 mg by mouth daily.      . fluticasone (FLONASE) 50 MCG/ACT nasal spray PLACE 2 SPRAYS INTO THE NOSE DAILY 16 g 0  . LORazepam (ATIVAN) 1 MG tablet Take 1 mg by mouth daily.     . metoprolol tartrate (LOPRESSOR) 25 MG tablet Take 12.5 mg by mouth 2 (two) times daily. Take 1/2 tablet  twice a day    . montelukast (SINGULAIR) 10 MG tablet TAKE 1 TABLET ONCE A DAY 30 tablet 3  . nitroGLYCERIN (NITROSTAT) 0.4 MG SL tablet Place 1 tablet (0.4 mg total) under the tongue every 5 (five) minutes as needed for chest pain. 25 tablet 6  .  omeprazole (PRILOSEC) 40 MG capsule Take 40 mg by mouth daily.      . predniSONE (DELTASONE) 5 MG tablet Take 5 mg by mouth daily.      . promethazine (PHENERGAN) 25 MG tablet Take 25 mg by mouth every 6 (six) hours as needed. For nausea and sleep    . rosuvastatin (CRESTOR) 20 MG tablet Take 1 tablet (20 mg total) by mouth daily. 30 tablet 6   No current facility-administered medications for this visit.    Allergies  Allergen Reactions  . Codeine Nausea And Vomiting  . Erythromycin Nausea And Vomiting  . Hydromorphone Hcl Nausea And Vomiting  . Nalbuphine Nausea And Vomiting  . Niaspan [Niacin Er] Other (See Comments)    unknown  . Sulfonamide Derivatives Other (See Comments)    Reaction: per patient "tears her stomach up"  . Tetracycline Hives    History   Social History  . Marital Status: Married    Spouse Name: N/A    Number of Children: N/A  . Years of Education: N/A   Occupational History  . Not on file.   Social History Main Topics  . Smoking status: Former Smoker -- 1.00 packs/day for 30 years    Types: Cigarettes    Quit date: 08/17/2002  .  Smokeless tobacco: Never Used  . Alcohol Use: No  . Drug Use: No  . Sexual Activity: Not on file   Other Topics Concern  . 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 Prior to his diagnosis of her husband, she actually had adopted a 80-year-old child who she knows caring for as well. With all the surgeries, they have been quite financially troubled. Thanks the help of her community and church, they have been able to stay "alfoat."     She is a former smoker who quit in 2004 after a 30-pack-year history.   She is active chasing a 44-year-old child, does not do routine exercise. She's been quite depressed with the condition of her husband, and admits to eating comfort herself.   She does not  drink alcohol.     Review of Systems: General: negative for chills, fever, night sweats or weight changes.  Cardiovascular: negative for chest pain, dyspnea on exertion, edema, orthopnea, palpitations, paroxysmal nocturnal dyspnea or shortness of breath Dermatological: negative for rash Respiratory: negative for cough or wheezing Urologic: negative for hematuria Abdominal: negative for nausea, vomiting, diarrhea, bright red blood per rectum, melena, or hematemesis Neurologic: negative for visual changes, syncope, or dizziness All other systems reviewed and are otherwise negative except as noted above.    Blood pressure 150/84, height 5' (1.524 m), weight 183 lb 1.6 oz (83.054 kg).  General appearance: alert, cooperative, no distress and moderately obese Neck: no carotid bruit and no JVD Lungs: clear to auscultation bilaterally Heart: regular rate and rhythm  EKG NSR LAD, PVCs  ASSESSMENT AND PLAN:   Palpitations She complains of increasing palpitations  Frequent PVCs On exam and on EKG today  S/P CABG (coronary artery bypass graft) x 3 Last cath Jan 2015- SVG-Dx DES  Morbid obesity - s/p Lap Band 9/'05 BMI 35  Sleep apnea She says she was taken off her C-pap after her lap banding  Fatigue She says this is new for her.  Renal transplant disorder Renal transplant in 1991    PLAN  I suggested she increase her metoprolol to 25 mg in the am and 12.5 mg at night. She takes Phenergan at night as well. She tells me Dr Jimmy Footman decreased her Luisa Dago to 1 mg daily and asked if this could be increased but I declined, deferring this to Dr Deterding. I would like to get a two week event monitor and a Lexiscan Myoview. I also suggested she go back on her C-pap to see if this doesn't help her daytime fatigue.   Deiontae Rabel KPA-C 06/21/2014 2:21 PM

## 2014-06-21 NOTE — Assessment & Plan Note (Signed)
She says this is new for her.

## 2014-06-21 NOTE — Telephone Encounter (Signed)
Spoke to [employer]- at [employer]  he states the patient's  initial reading- showed afib- per tech The strip will be faxed to the office. RN notified Kerin Ransom PA- [facility] PA saw patient earlier today and had monitor ordered and applied  [place] PA- REVIEWED URGENT REPORT - SIGNED.

## 2014-06-21 NOTE — Assessment & Plan Note (Signed)
She says she was taken off her C-pap after her lap banding

## 2014-06-21 NOTE — Assessment & Plan Note (Signed)
BMI 35 

## 2014-06-21 NOTE — Telephone Encounter (Signed)
Legrand Como called in wanting to report and abnormal ECG

## 2014-06-21 NOTE — Progress Notes (Signed)
Carotid Duplex Completed. °Brianna L Mazza,RVT °

## 2014-06-21 NOTE — Assessment & Plan Note (Addendum)
Last cath Jan 2015- SVG-Dx DES

## 2014-06-21 NOTE — Patient Instructions (Addendum)
Your physician recommends that you schedule a follow-up appointment in: [place] has recommended that you wear an event monitor. Event monitors are medical devices that record the heart's electrical activity. Doctors most often [place] these monitors to diagnose arrhythmias. Arrhythmias are problems with the speed or rhythm of the heartbeat. The monitor is a small, portable device. You can wear one while you do your normal daily activities. This is usually used to diagnose what is causing palpitations/syncope (passing out). 2 Weeks  Your physician has recommended you make the following change in your medication: Increase Metoprolol to 25 mg in the morning and 12.5 mg in the evening.  Your physician has requested that you have a lexiscan myoview. For further information please visit [email or web address]. Please follow instruction sheet, as given.

## 2014-06-21 NOTE — Assessment & Plan Note (Signed)
Renal transplant in 1991

## 2014-06-21 NOTE — Assessment & Plan Note (Signed)
She complains of increasing palpitations

## 2014-06-22 DIAGNOSIS — R002 Palpitations: Secondary | ICD-10-CM | POA: Diagnosis not present

## 2014-06-25 ENCOUNTER — Telehealth: Payer: Self-pay | Admitting: Cardiology

## 2014-06-25 DIAGNOSIS — R002 Palpitations: Secondary | ICD-10-CM

## 2014-06-25 DIAGNOSIS — I493 Ventricular premature depolarization: Secondary | ICD-10-CM

## 2014-06-25 DIAGNOSIS — R5383 Other fatigue: Secondary | ICD-10-CM

## 2014-06-25 DIAGNOSIS — I4891 Unspecified atrial fibrillation: Secondary | ICD-10-CM

## 2014-06-25 DIAGNOSIS — I480 Paroxysmal atrial fibrillation: Secondary | ICD-10-CM

## 2014-06-25 NOTE — Telephone Encounter (Signed)
Phone busy will try 06/26/14

## 2014-06-25 NOTE — Telephone Encounter (Signed)
Monitor shows AF with RVR. I'm not sure why she has been on such a low dose of Lopressor, I did increase this on her recent OV. She has a history of a remote GI bleed but her Hgb has been stable going back to Jan this year. Increase Lopressor to 25 mg twice a day, add Diltiazem CD 120 mg daily, start Eliquis 5 mg BID, she needs CBC, BMP, TSH, and Mg++ drawn. She should see an APP later this week for an EKG.   Kerin Ransom PA-C 06/25/2014 11:38 AM

## 2014-06-25 NOTE — Telephone Encounter (Signed)
Spoke to tech. -[place]  reporting an urgent repot recording patient.it was auto trigger on 06/24/14 .  report heart rate /afib 166. Urgent report given to [facility] PA- He order the monitor

## 2014-06-25 NOTE — Telephone Encounter (Signed)
PHONE BUSY- WILL TRYAGAIN

## 2014-06-26 DIAGNOSIS — I480 Paroxysmal atrial fibrillation: Secondary | ICD-10-CM | POA: Diagnosis not present

## 2014-06-26 DIAGNOSIS — I493 Ventricular premature depolarization: Secondary | ICD-10-CM | POA: Diagnosis not present

## 2014-06-26 DIAGNOSIS — R002 Palpitations: Secondary | ICD-10-CM | POA: Diagnosis not present

## 2014-06-26 DIAGNOSIS — I4891 Unspecified atrial fibrillation: Secondary | ICD-10-CM | POA: Diagnosis not present

## 2014-06-26 DIAGNOSIS — R5383 Other fatigue: Secondary | ICD-10-CM | POA: Diagnosis not present

## 2014-06-26 MED ORDER — METOPROLOL TARTRATE 25 MG PO TABS: 25.0000 mg | ORAL_TABLET | Freq: Two times a day (BID) | ORAL | Status: DC

## 2014-06-26 MED ORDER — APIXABAN 5 MG PO TABS: 5.0000 mg | ORAL_TABLET | Freq: Two times a day (BID) | ORAL | Status: DC

## 2014-06-26 MED ORDER — DILTIAZEM HCL ER COATED BEADS 120 MG PO CP24: 120.0000 mg | ORAL_CAPSULE | Freq: Every day | ORAL | Status: DC

## 2014-06-26 NOTE — Telephone Encounter (Signed)
SPOKE TO PATIENT  RN INFORMED PATIENT ,ATTEMPTING TO CONTACT HER FOR SEVERAL DAYS PATIENT STATES SHE HAD TO GET MORE  MINUTES FOR  HER PHONE. RN INFORMED PATIENT- LUKE KILROY PA ORDERS - IN REGARDING URGENT CARDIONET  REPORT - AFIB WITH RVR SHE WILL GET LABS DONE 06/27/14  MEDICATION WAS  E-SENT TO PHARMACY - A FREE TRIAL  CARD FOR ELIQIUS PLACED AT FRONT DESK FOR PICK UP ON 06/27/14 WILL HAVE SOMEONE CONTACT PATIENT ABOUT AN APPOINTMENT WITH AN EXTENDER FOR 06/28/14 OR 06/29/14

## 2014-06-27 LAB — BASIC METABOLIC PANEL
BUN: 11 mg/dL (ref 6–23)
CO2: 26 mEq/L (ref 19–32)
Calcium: 10 mg/dL (ref 8.4–10.5)
Chloride: 103 mEq/L (ref 96–112)
Creat: 0.9 mg/dL (ref 0.50–1.10)
Glucose, Bld: 140 mg/dL — ABNORMAL HIGH (ref 70–99)
Potassium: 3.4 mEq/L — ABNORMAL LOW (ref 3.5–5.3)
Sodium: 145 mEq/L (ref 135–145)

## 2014-06-27 LAB — CBC
HCT: 41.6 % (ref 36.0–46.0)
Hemoglobin: 14.1 g/dL (ref 12.0–15.0)
MCH: 29.8 pg (ref 26.0–34.0)
MCHC: 33.9 g/dL (ref 30.0–36.0)
MCV: 87.9 fL (ref 78.0–100.0)
Platelets: 227 10*3/uL (ref 150–400)
RBC: 4.73 MIL/uL (ref 3.87–5.11)
RDW: 14.4 % (ref 11.5–15.5)
WBC: 6.2 10*3/uL (ref 4.0–10.5)

## 2014-06-27 LAB — TSH: TSH: 0.904 u[IU]/mL (ref 0.350–4.500)

## 2014-06-27 LAB — MAGNESIUM: Magnesium: 1.7 mg/dL (ref 1.5–2.5)

## 2014-06-27 NOTE — Telephone Encounter (Signed)
Patient is scheduled to see. Katherine [name] 11/12 @ 1:30 - patient notified.

## 2014-06-28 ENCOUNTER — Emergency Department (HOSPITAL_COMMUNITY): Payer: Medicare Other

## 2014-06-28 ENCOUNTER — Observation Stay (HOSPITAL_COMMUNITY)
Admission: EM | Admit: 2014-06-28 | Discharge: 2014-06-29 | Disposition: A | Discharge status: Home / Self Care | Payer: Medicare Other | Attending: Cardiology | Admitting: Cardiology

## 2014-06-28 ENCOUNTER — Encounter (HOSPITAL_COMMUNITY): Payer: Self-pay | Admitting: *Deleted

## 2014-06-28 ENCOUNTER — Ambulatory Visit: Payer: Medicare Other | Admitting: Nurse Practitioner

## 2014-06-28 DIAGNOSIS — R531 Weakness: Secondary | ICD-10-CM | POA: Diagnosis not present

## 2014-06-28 DIAGNOSIS — Z87891 Personal history of nicotine dependence: Secondary | ICD-10-CM | POA: Diagnosis not present

## 2014-06-28 DIAGNOSIS — J449 Chronic obstructive pulmonary disease, unspecified: Secondary | ICD-10-CM | POA: Diagnosis not present

## 2014-06-28 DIAGNOSIS — E119 Type 2 diabetes mellitus without complications: Secondary | ICD-10-CM | POA: Diagnosis not present

## 2014-06-28 DIAGNOSIS — I480 Paroxysmal atrial fibrillation: Secondary | ICD-10-CM | POA: Diagnosis not present

## 2014-06-28 DIAGNOSIS — G4733 Obstructive sleep apnea (adult) (pediatric): Secondary | ICD-10-CM | POA: Insufficient documentation

## 2014-06-28 DIAGNOSIS — R002 Palpitations: Secondary | ICD-10-CM | POA: Diagnosis not present

## 2014-06-28 DIAGNOSIS — Z940 Kidney transplant status: Secondary | ICD-10-CM | POA: Diagnosis not present

## 2014-06-28 DIAGNOSIS — E669 Obesity, unspecified: Secondary | ICD-10-CM | POA: Insufficient documentation

## 2014-06-28 DIAGNOSIS — E876 Hypokalemia: Secondary | ICD-10-CM

## 2014-06-28 DIAGNOSIS — I120 Hypertensive chronic kidney disease with stage 5 chronic kidney disease or end stage renal disease: Secondary | ICD-10-CM | POA: Diagnosis not present

## 2014-06-28 DIAGNOSIS — S299XXA Unspecified injury of thorax, initial encounter: Secondary | ICD-10-CM | POA: Diagnosis not present

## 2014-06-28 DIAGNOSIS — Z881 Allergy status to other antibiotic agents status: Secondary | ICD-10-CM | POA: Diagnosis not present

## 2014-06-28 DIAGNOSIS — M797 Fibromyalgia: Secondary | ICD-10-CM | POA: Insufficient documentation

## 2014-06-28 DIAGNOSIS — R42 Dizziness and giddiness: Secondary | ICD-10-CM | POA: Insufficient documentation

## 2014-06-28 DIAGNOSIS — I2510 Atherosclerotic heart disease of native coronary artery without angina pectoris: Secondary | ICD-10-CM | POA: Insufficient documentation

## 2014-06-28 DIAGNOSIS — I25810 Atherosclerosis of coronary artery bypass graft(s) without angina pectoris: Secondary | ICD-10-CM | POA: Diagnosis not present

## 2014-06-28 DIAGNOSIS — I739 Peripheral vascular disease, unspecified: Secondary | ICD-10-CM | POA: Insufficient documentation

## 2014-06-28 DIAGNOSIS — Z882 Allergy status to sulfonamides status: Secondary | ICD-10-CM | POA: Diagnosis not present

## 2014-06-28 DIAGNOSIS — Z9884 Bariatric surgery status: Secondary | ICD-10-CM | POA: Diagnosis not present

## 2014-06-28 DIAGNOSIS — I4891 Unspecified atrial fibrillation: Secondary | ICD-10-CM

## 2014-06-28 DIAGNOSIS — Z885 Allergy status to narcotic agent status: Secondary | ICD-10-CM | POA: Diagnosis not present

## 2014-06-28 DIAGNOSIS — I493 Ventricular premature depolarization: Secondary | ICD-10-CM

## 2014-06-28 DIAGNOSIS — R5381 Other malaise: Secondary | ICD-10-CM

## 2014-06-28 DIAGNOSIS — F419 Anxiety disorder, unspecified: Secondary | ICD-10-CM | POA: Insufficient documentation

## 2014-06-28 DIAGNOSIS — R0789 Other chest pain: Secondary | ICD-10-CM | POA: Diagnosis not present

## 2014-06-28 DIAGNOSIS — M069 Rheumatoid arthritis, unspecified: Secondary | ICD-10-CM | POA: Diagnosis not present

## 2014-06-28 DIAGNOSIS — N186 End stage renal disease: Secondary | ICD-10-CM | POA: Insufficient documentation

## 2014-06-28 DIAGNOSIS — R0602 Shortness of breath: Secondary | ICD-10-CM | POA: Diagnosis not present

## 2014-06-28 DIAGNOSIS — F329 Major depressive disorder, single episode, unspecified: Secondary | ICD-10-CM | POA: Insufficient documentation

## 2014-06-28 DIAGNOSIS — E785 Hyperlipidemia, unspecified: Secondary | ICD-10-CM | POA: Insufficient documentation

## 2014-06-28 DIAGNOSIS — Z79899 Other long term (current) drug therapy: Secondary | ICD-10-CM | POA: Diagnosis not present

## 2014-06-28 DIAGNOSIS — Z888 Allergy status to other drugs, medicaments and biological substances status: Secondary | ICD-10-CM | POA: Insufficient documentation

## 2014-06-28 DIAGNOSIS — Z7982 Long term (current) use of aspirin: Secondary | ICD-10-CM | POA: Diagnosis not present

## 2014-06-28 DIAGNOSIS — R5383 Other fatigue: Secondary | ICD-10-CM | POA: Diagnosis not present

## 2014-06-28 DIAGNOSIS — S298XXA Other specified injuries of thorax, initial encounter: Secondary | ICD-10-CM

## 2014-06-28 DIAGNOSIS — R079 Chest pain, unspecified: Secondary | ICD-10-CM | POA: Diagnosis not present

## 2014-06-28 LAB — I-STAT CHEM 8, ED
BUN: 15 mg/dL (ref 6–23)
CALCIUM ION: 1.23 mmol/L (ref 1.12–1.23)
Chloride: 99 mEq/L (ref 96–112)
Creatinine, Ser: 0.7 mg/dL (ref 0.50–1.10)
Glucose, Bld: 157 mg/dL — ABNORMAL HIGH (ref 70–99)
HEMATOCRIT: 40 % (ref 36.0–46.0)
Hemoglobin: 13.6 g/dL (ref 12.0–15.0)
Potassium: 3.1 mEq/L — ABNORMAL LOW (ref 3.7–5.3)
Sodium: 141 mEq/L (ref 137–147)
TCO2: 28 mmol/L (ref 0–100)

## 2014-06-28 LAB — I-STAT TROPONIN, ED: Troponin i, poc: 0.01 ng/mL (ref 0.00–0.08)

## 2014-06-28 MED ORDER — MONTELUKAST SODIUM 10 MG PO TABS
10.0000 mg | ORAL_TABLET | Freq: Every day | ORAL | Status: DC
  Administered 2014-06-29: 10 mg via ORAL
  Filled 2014-06-28 (×2): qty 1

## 2014-06-28 MED ORDER — MORPHINE SULFATE 4 MG/ML IJ SOLN: 4.0000 mg | Freq: Once | INTRAMUSCULAR | Status: DC

## 2014-06-28 MED ORDER — PROMETHAZINE HCL 25 MG PO TABS
25.0000 mg | ORAL_TABLET | Freq: Four times a day (QID) | ORAL | Status: DC | PRN
  Administered 2014-06-29: 25 mg via ORAL
  Filled 2014-06-28: qty 1

## 2014-06-28 MED ORDER — SERTRALINE HCL 100 MG PO TABS
200.0000 mg | ORAL_TABLET | Freq: Every day | ORAL | Status: DC
  Administered 2014-06-29: 200 mg via ORAL
  Filled 2014-06-28: qty 2

## 2014-06-28 MED ORDER — APIXABAN 5 MG PO TABS
5.0000 mg | ORAL_TABLET | Freq: Two times a day (BID) | ORAL | Status: DC
  Administered 2014-06-29 (×2): 5 mg via ORAL
  Filled 2014-06-28 (×3): qty 1

## 2014-06-28 MED ORDER — ONDANSETRON HCL 4 MG/2ML IJ SOLN
4.0000 mg | Freq: Four times a day (QID) | INTRAMUSCULAR | Status: DC | PRN
  Administered 2014-06-28: 4 mg via INTRAVENOUS
  Filled 2014-06-28: qty 2

## 2014-06-28 MED ORDER — CYCLOBENZAPRINE HCL 10 MG PO TABS
10.0000 mg | ORAL_TABLET | Freq: Three times a day (TID) | ORAL | Status: DC
  Filled 2014-06-28 (×4): qty 1

## 2014-06-28 MED ORDER — ONDANSETRON HCL 4 MG/2ML IJ SOLN
4.0000 mg | Freq: Once | INTRAMUSCULAR | Status: AC
  Administered 2014-06-28: 4 mg via INTRAVENOUS
  Filled 2014-06-28: qty 2

## 2014-06-28 MED ORDER — MORPHINE SULFATE 4 MG/ML IJ SOLN
4.0000 mg | Freq: Once | INTRAMUSCULAR | Status: AC
Start: 2014-06-28 — End: 2014-06-28
  Administered 2014-06-28: 4 mg via INTRAVENOUS

## 2014-06-28 MED ORDER — CLOPIDOGREL BISULFATE 75 MG PO TABS
75.0000 mg | ORAL_TABLET | Freq: Every day | ORAL | Status: DC
  Administered 2014-06-29: 75 mg via ORAL
  Filled 2014-06-28: qty 1

## 2014-06-28 MED ORDER — NITROGLYCERIN 0.4 MG SL SUBL
0.4000 mg | SUBLINGUAL_TABLET | SUBLINGUAL | Status: DC | PRN
Start: 2014-06-28 — End: 2014-06-29

## 2014-06-28 MED ORDER — MORPHINE SULFATE 4 MG/ML IJ SOLN
4.0000 mg | Freq: Once | INTRAMUSCULAR | Status: AC
  Administered 2014-06-28: 4 mg via INTRAMUSCULAR
  Filled 2014-06-28: qty 1

## 2014-06-28 MED ORDER — LORAZEPAM 1 MG PO TABS
1.0000 mg | ORAL_TABLET | Freq: Every day | ORAL | Status: DC
  Administered 2014-06-29 (×2): 1 mg via ORAL
  Filled 2014-06-28 (×2): qty 1

## 2014-06-28 MED ORDER — AZATHIOPRINE 50 MG PO TABS
125.0000 mg | ORAL_TABLET | Freq: Every day | ORAL | Status: DC
  Filled 2014-06-28: qty 3

## 2014-06-28 MED ORDER — DILTIAZEM HCL ER COATED BEADS 120 MG PO CP24
120.0000 mg | ORAL_CAPSULE | Freq: Every day | ORAL | Status: DC
  Administered 2014-06-29: 120 mg via ORAL
  Filled 2014-06-28: qty 1

## 2014-06-28 MED ORDER — PROMETHAZINE HCL 25 MG/ML IJ SOLN
12.5000 mg | Freq: Once | INTRAMUSCULAR | Status: AC
  Administered 2014-06-29: 12.5 mg via INTRAVENOUS
  Filled 2014-06-28: qty 1

## 2014-06-28 MED ORDER — FLUTICASONE PROPIONATE 50 MCG/ACT NA SUSP
2.0000 | Freq: Every day | NASAL | Status: DC
  Filled 2014-06-28: qty 16

## 2014-06-28 MED ORDER — DULOXETINE HCL 30 MG PO CPEP
30.0000 mg | ORAL_CAPSULE | Freq: Every day | ORAL | Status: DC
  Administered 2014-06-29: 30 mg via ORAL
  Filled 2014-06-28: qty 1

## 2014-06-28 MED ORDER — CALCITRIOL 0.25 MCG PO CAPS
0.2500 ug | ORAL_CAPSULE | Freq: Every day | ORAL | Status: DC
  Administered 2014-06-29: 0.25 ug via ORAL
  Filled 2014-06-28: qty 1

## 2014-06-28 MED ORDER — POTASSIUM CHLORIDE CRYS ER 20 MEQ PO TBCR
40.0000 meq | EXTENDED_RELEASE_TABLET | Freq: Once | ORAL | Status: AC
  Administered 2014-06-28: 40 meq via ORAL
  Filled 2014-06-28: qty 2

## 2014-06-28 MED ORDER — METOPROLOL TARTRATE 50 MG PO TABS
50.0000 mg | ORAL_TABLET | Freq: Two times a day (BID) | ORAL | Status: DC
  Administered 2014-06-29: 50 mg via ORAL
  Filled 2014-06-28 (×3): qty 1

## 2014-06-28 MED ORDER — PREDNISONE 5 MG PO TABS
5.0000 mg | ORAL_TABLET | Freq: Every day | ORAL | Status: DC
  Administered 2014-06-29: 5 mg via ORAL
  Filled 2014-06-28: qty 1

## 2014-06-28 MED ORDER — ROSUVASTATIN CALCIUM 20 MG PO TABS
20.0000 mg | ORAL_TABLET | Freq: Every day | ORAL | Status: DC
  Administered 2014-06-29: 20 mg via ORAL
  Filled 2014-06-28: qty 1

## 2014-06-28 MED ORDER — MORPHINE SULFATE 4 MG/ML IJ SOLN
4.0000 mg | Freq: Once | INTRAMUSCULAR | Status: DC
  Filled 2014-06-28: qty 1

## 2014-06-28 MED ORDER — ACETAMINOPHEN 325 MG PO TABS
650.0000 mg | ORAL_TABLET | ORAL | Status: DC | PRN
  Administered 2014-06-29: 650 mg via ORAL
  Filled 2014-06-28: qty 2

## 2014-06-28 MED ORDER — PANTOPRAZOLE SODIUM 40 MG PO TBEC
80.0000 mg | DELAYED_RELEASE_TABLET | Freq: Every day | ORAL | Status: DC
  Administered 2014-06-29 (×2): 80 mg via ORAL
  Filled 2014-06-28 (×2): qty 2
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
## 2014-06-28 NOTE — ED Notes (Signed)
Pt c/o feeling "funny" states feels weak and lightheaded pt remains in afib at 67/min with unifocal pvcs noted pt states she started to feel "weird" after morphing no resp distress noted no s/sx of allergic reation noted pt appears anxious

## 2014-06-28 NOTE — ED Notes (Signed)
Pt was given some crackers and something to drink
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
## 2014-06-28 NOTE — ED Notes (Signed)
NOTIFIED DR, Christy Gentles IN PERSON FOR PATIENTS PANIC LABS OF CHEM8+, I-STAT TROPONIN ,@14 :32 PM ,06/28/2014.

## 2014-06-29 DIAGNOSIS — R0789 Other chest pain: Secondary | ICD-10-CM | POA: Diagnosis not present

## 2014-06-29 DIAGNOSIS — I480 Paroxysmal atrial fibrillation: Secondary | ICD-10-CM | POA: Diagnosis not present

## 2014-06-29 DIAGNOSIS — I25810 Atherosclerosis of coronary artery bypass graft(s) without angina pectoris: Secondary | ICD-10-CM | POA: Diagnosis not present

## 2014-06-29 DIAGNOSIS — R531 Weakness: Secondary | ICD-10-CM | POA: Diagnosis not present

## 2014-06-29 DIAGNOSIS — I2510 Atherosclerotic heart disease of native coronary artery without angina pectoris: Secondary | ICD-10-CM | POA: Diagnosis not present

## 2014-06-29 LAB — BASIC METABOLIC PANEL
Anion gap: 13 (ref 5–15)
BUN: 15 mg/dL (ref 6–23)
CO2: 28 mEq/L (ref 19–32)
Calcium: 9.6 mg/dL (ref 8.4–10.5)
Chloride: 103 mEq/L (ref 96–112)
Creatinine, Ser: 0.71 mg/dL (ref 0.50–1.10)
GFR calc Af Amer: >90 mL/min (ref >90)
GLUCOSE: 119 mg/dL — AB (ref 70–99)
POTASSIUM: 3.5 meq/L — AB (ref 3.7–5.3)
SODIUM: 144 meq/L (ref 137–147)

## 2014-06-29 LAB — MRSA PCR SCREENING: MRSA BY PCR: NEGATIVE

## 2014-06-29 MED ORDER — POTASSIUM CHLORIDE CRYS ER 20 MEQ PO TBCR
40.0000 meq | EXTENDED_RELEASE_TABLET | Freq: Once | ORAL | Status: DC
  Filled 2014-06-29: qty 2

## 2014-06-29 MED ORDER — ACETAMINOPHEN 500 MG PO TABS
1000.0000 mg | ORAL_TABLET | Freq: Three times a day (TID) | ORAL | Status: DC | PRN
  Filled 2014-06-29: qty 2

## 2014-06-29 MED ORDER — METOPROLOL TARTRATE 50 MG PO TABS: 50.0000 mg | ORAL_TABLET | Freq: Two times a day (BID) | ORAL | Status: DC

## 2014-06-29 MED ORDER — AZATHIOPRINE 50 MG PO TABS
125.0000 mg | ORAL_TABLET | Freq: Once | ORAL | Status: AC
  Administered 2014-06-29: 125 mg via ORAL
  Filled 2014-06-29: qty 3

## 2014-06-29 NOTE — Discharge Instructions (Signed)

## 2014-06-29 NOTE — Progress Notes (Signed)
Patient Name: Katherine [name] Date of Encounter: 06/29/2014  Active Problems:   Paroxysmal atrial fibrillation   Weakness   Primary Cardiologist: Dr. Ellyn Hack  Patient Profile: Katherine [name] is a 58 y.o. female with PMH of CAD (CABG -1995 LIMA-LAD, SVG-bifurcating D1, SVG-rPDA-1995; PCI x 5to SVG-D1 in 2001, 2002, 2003, 2004, and 08/2013), Type II DM, COPD, OSA (On CPAP), HTN, HLD, Renal Transplant (1991), PAD, Fibromyalgia, and Rheumatoid Arthritis who presented to the ED on 06/28/2014 for weakness, fatigue, and palpitations over the past 2 months.  SUBJECTIVE: Had nausea and vomiting throughout the night that was refractory to Zofran. Was given IV Phenergan and says that relieved her symptoms. Still having palpitations and says they are occurring at about the same rate before her admission. Main area of chest pain is still located under the left breast and is tender to palpation.  OBJECTIVE Filed Vitals:   06/28/14 2153 06/28/14 2228 06/29/14 0035 06/29/14 0446  BP: 153/75 108/43 131/53 121/62  Pulse: 80 52  53  Temp: 98.1 F (36.7 C)   98.2 F (36.8 C)  TempSrc: Oral   Oral  Resp: 20   18  Height: 5' (1.524 m)     Weight: 181 lb 14.1 oz (82.5 kg)     SpO2: 100%   100%   No intake or output data in the 24 hours ending 06/29/14 0750 Filed Weights   06/28/14 2153  Weight: 181 lb 14.1 oz (82.5 kg)    PHYSICAL EXAM General: Well developed, well nourished, female in no acute distress. Head: Normocephalic, atraumatic.  Neck: Supple without bruits, JVD not elevated. Lungs:  Resp regular and unlabored, CTA without wheezing or crackles. Heart: Irregularly irregular, S1, S2, no S3, S4, or murmur; no rub. Abdomen: Soft, non-tender, non-distended, BS + x 4.  Extremities: No clubbing, no cyanosis, no edema. Distal pulses 2+. Neuro: Alert and oriented X 3. Moves all extremities spontaneously. Psych: Normal affect.  LABS: CBC: Recent Labs  06/26/14 1108 06/28/14 1512    WBC 6.2  --   HGB 14.1 13.6  HCT 41.6 40.0  MCV 87.9  --   PLT 227  --    Basic Metabolic Panel: Recent Labs  06/26/14 1108 06/28/14 1512 06/29/14 0422  NA 145 141 144  K 3.4* 3.1* 3.5*  CL 103 99 103  CO2 26  --  28  GLUCOSE 140* 157* 119*  BUN 11 15 15   CREATININE 0.90 0.70 0.71  CALCIUM 10.0  --  9.6  MG 1.7  --   --     Recent Labs  06/28/14 1420  TROPIPOC 0.01   BNP: PRO B NATRIURETIC PEPTIDE (BNP)  Date/Time Value Ref Range Status  06/04/2008 11:55 AM 229.0*  Final   Thyroid Function Tests: Recent Labs  06/26/14 1108  TSH 0.904   TELE: Sinus Rhythm with HR ~ 45-75. Several episodes of PAC's and PVC's. One episode of possible Ventricular Tachycardia/Abberant SVT.      ECG: Mostly sinus rhythm with occasional PAC and PVC.   Radiology/Studies: Dg Ribs Unilateral W/chest Left - 06/28/2014   CLINICAL DATA:  Direct trauma to the left chest in a kinking injury 3 days ago now with persistent left lower anterior rib discomfort; the patient has a cardiac monitor ; history of COPD  EXAM: LEFT RIBS AND CHEST - 3+ VIEW  COMPARISON:  PA and lateral chest x-ray of October 16, 2013  FINDINGS: The lungs are adequately inflated. The interstitial markings are coarse  but stable. The heart normal in size. The patient has undergone previous CABG. Three of the mid to lower sternal wires are broken. There are coronary artery bypass graft markers. The pulmonary vascularity is not engorged. There is no pleural effusion or pneumothorax. There is an inflatable ring at the level of the GE junction.  The ribs are reasonably well mineralized. No acute displaced fracture is demonstrated.  IMPRESSION: 1. There is no acute displaced rib fracture. There is no evidence of a pulmonary contusion, pneumothorax, or pleural effusion. 2. COPD and post CABG changes. There is no evidence of CHF nor pneumonia.   Electronically Signed   By: David  Martinique   On: 06/28/2014 16:03   Current Medications:  .  apixaban  5 mg Oral BID  . azaTHIOprine  125 mg Oral Daily  . calcitRIOL  0.25 mcg Oral Daily  . clopidogrel  75 mg Oral Daily  . cyclobenzaprine  10 mg Oral TID  . diltiazem  120 mg Oral Daily  . DULoxetine  30 mg Oral Daily  . fluticasone  2 spray Each Nare Daily  . LORazepam  1 mg Oral Daily  . metoprolol tartrate  50 mg Oral BID  . montelukast  10 mg Oral QHS  . pantoprazole  80 mg Oral Daily  . predniSONE  5 mg Oral Daily  . rosuvastatin  20 mg Oral Daily  . sertraline  200 mg Oral Daily    ASSESSMENT AND PLAN: 1. Palpitation/Fatigue - no persistent a-fib noted while in the ED or on telemetry since admission, does have frequent PACs and PVCs. - palpitation related to PVCs and PACs, and fatigue may be related to metoprolol or diltiazem. - Per Dr. Percival Spanish, if patient has not had Echo performed before d/c, then it can be obtained as an outpatient.   2. Musculoskeletal rib pain - worse with body twist and palpation, no further workup.  3. Paroxysmal A-fib - Appears to be in NSR, rate controlled.  - Eliquis started yesterday. Patient already has 30 day trial card of Eliquis but had not started it yet prior to admission. - Lopressor increased from 25mg  BID to 50mg  BID yesterday. - Continue Cardizem CD 120mg  daily.  4. CAD  - CABG -1995 LIMA-LAD, SVG-bifurcating D1, SVG-rPDA-1995 and PCI x 5 To SVG-D1 in 2001, 2002, 2003, 2004, and 08/2013 - No recurrent angina symptoms - Continue Eliquis and Plavix. Aspirin discontinued yesterday to avoid triple therapy.  5. Hypokalemia - Potassium 3.1 on admission. 3.5 on 06/29/2014.  - Replete Potassium with 40 mEq once today before d/c.  6. HTN - BP has been 103/72 - 153/75 since admission. - Continue Metoprolol 50mg  BID. - Continue Cardizem CD 120mg  daily.  7. HLD - Continue Crestor 20mg  daily.  8. Past History of GI Bleed - Per Dr. Percival Spanish a stool guaiac was ordered. Has not yet been collected. - Patient denies any  melena or hematochezia and Hgb was 14.1 on 06/26/2014.  Patient had already been scheduled to follow-up with Cecilie Kicks on December 1st at 10:30, prior to her admission.  Signed, Patient seen and examined with PA-S Dineen Kid. Changes made where appropriate.   Rosaria Ferries , PA-C 7:50 AM 06/29/2014   I have personally seen and examined this patient with Rosaria Ferries, PA-C.  I agree with the assessment and plan as outlined above. Her chest pain is not likely cardiac. Tylenol for MSK pain. Now in sinus. Continue current meds. Anti-coagulation with Eliquis. Discharge home later today if  nausea improved.   Shadd Dunstan 06/29/2014 10:42 AM

## 2014-06-29 NOTE — Progress Notes (Signed)
UR completed 

## 2014-06-29 NOTE — Progress Notes (Signed)
06/29/2014 2:09 AM After administering the IV phenergan, the patient states she felt relief from her nausea and is resting comfortably.  Will continue to monitor patient. Katherine [name]

## 2014-06-29 NOTE — Progress Notes (Signed)
Pt given discharge instructions, medication lists, follow up appointments, and when to call the doctor.  Pt verbalizes understanding. Pt transported to main entrance to be picked up by husband. Payton Emerald, RN

## 2014-06-29 NOTE — Progress Notes (Signed)
06/28/2014 10:25 PM The patient was having a lot of nausea and vomiting and the two zofrans taken before have not helped.  The patient has phenergan ordered but it is by mouth and she does not think she could keep it down.  I paged Dr. Claiborne Billings to get phenergan IV and he said to give her a one time dose of Phenergan 12.5 mg given IV once. Will continue to monitor patient. Lupita Dawn

## 2014-06-29 NOTE — Plan of Care (Signed)
Problem: Phase I Progression Outcomes Goal: Pain controlled with appropriate interventions Outcome: Completed/Met Date Met:  06/29/14     

## 2014-06-29 NOTE — Discharge Summary (Signed)
CARDIOLOGY DISCHARGE SUMMARY   Patient ID: Katherine [name] MRN: [id number] DOB/AGE: Dec 08, 1955 58 y.o.  Admit date: 06/28/2014 Discharge date: 06/29/2014  PCP: No PCP Per Patient Primary Cardiologist: Dr. Ellyn Hack  Primary Discharge Diagnosis:  Weakness associated with palpitations - possible short RP tachycardia  Secondary Discharge Diagnosis:    Paroxysmal atrial fibrillation   Musculoskeletal chest pain   Hypokalemia  Procedures: unilateral rib x-ray  Hospital Course: Katherine [name] is a 58 y.o. female with a history of CAD (CABG -1995 LIMA-LAD, SVG-bifurcating D1, SVG-rPDA-1995; PCI x 5 To SVG-D1 in 2001, 2002, 2003, 2004, and 08/2013), Type II DM, COPD, OSA (On CPAP), HTN, HLD, Renal Transplant (1991), PAD, Fibromyalgia, and Rheumatoid Arthritis presenting to the ED on 06/28/2014 for weakness, fatigue, and palpitations over the past 2 months.  She was noted to have chest pain, but it was musculoskeletal by exam and there was recent trauma to the area. A rib x-ray was performed, results are below. No obvious fracture was identified.   She was treated with morphine for pain control, but had significant problems with nausea and vomiting several hours afterwards. She has had problems with nausea and vomiting at home (has had a lap band placed in the past) and was taking by mouth's prior to discharge. She has a prescription for Phenergan at home, and is encouraged to follow-up with primary care for symptoms do not continue to improve.   Consideration was given to other medications, but she does not tolerate Dilaudid or codeine due to nausea and vomiting. The patient refused tramadol, and NSAIDs are not appropriate because of the Eliquis and a solitary kidney. Tylenol was used for pain control.   A recent event monitor was performed, results are still pending. She was maintained on telemetry overnight. No strips of atrial fibrillation were available. She continued to  complain of palpitations and telemetry was reviewed. She had PVCs and PACs on telemetry as well as one or 2 runs of a tachycardia, borderline wide complex. One run was scanned into the system, it is 18 beats and shows a decreasing R-R interval. This is likely an atrial tachycardia, possibly short RP tachycardia.  She had been on metoprolol prior to admission and this was increased. She was also noted to have hypokalemia and was supplemented. After supplementation, her potassium was still slightly low at 3.5. However, because of the nausea and vomiting she had, she was not given additional oral supplementation. She is encouraged to eat potassium rich foods after discharge, and follow up with her primary care physician.  On 11/13, she was seen by Dr. Angelena Form and all data were reviewed. Her chest pain had begun to improve on medications and her nausea/vomiting improved as well over time. No further inpatient workup was indicated and she is considered stable for discharge, to follow up as an outpatient.  Labs:  Lab Results  Component Value Date   WBC 6.2 06/26/2014   HGB 13.6 06/28/2014   HCT 40.0 06/28/2014   MCV 87.9 06/26/2014   PLT 227 06/26/2014     Recent Labs Lab 06/29/14 0422  NA 144  K 3.5*  CL 103  CO2 28  BUN 15  CREATININE 0.71  CALCIUM 9.6  GLUCOSE 119*     Radiology: Dg Ribs Unilateral W/chest Left 06/28/2014   CLINICAL DATA:  Direct trauma to the left chest in a kinking injury 3 days ago now with persistent left lower anterior rib discomfort; the patient has a cardiac monitor ;  history of COPD  EXAM: LEFT RIBS AND CHEST - 3+ VIEW  COMPARISON:  PA and lateral chest x-ray of October 16, 2013  FINDINGS: The lungs are adequately inflated. The interstitial markings are coarse but stable. The heart normal in size. The patient has undergone previous CABG. Three of the mid to lower sternal wires are broken. There are coronary artery bypass graft markers. The pulmonary vascularity is not  engorged. There is no pleural effusion or pneumothorax. There is an inflatable ring at the level of the GE junction.  The ribs are reasonably well mineralized. No acute displaced fracture is demonstrated.  IMPRESSION: 1. There is no acute displaced rib fracture. There is no evidence of a pulmonary contusion, pneumothorax, or pleural effusion. 2. COPD and post CABG changes. There is no evidence of CHF nor pneumonia.   Electronically Signed   By: David  Martinique   On: 06/28/2014 16:03   EKG: 06/29/2014 Sinus Bradycardia, no acute ischemic changes Vent. rate 55 BPM PR interval 140 ms QRS duration 102 ms QT/QTc 470/449 ms P-R-T axes 7 -11 159  FOLLOW UP PLANS AND APPOINTMENTS Allergies  Allergen Reactions  . Codeine Nausea And Vomiting  . Erythromycin Nausea And Vomiting  . Hydromorphone Hcl Nausea And Vomiting  . Nalbuphine Nausea And Vomiting  . Niaspan [Niacin Er] Other (See Comments)    unknown  . Sulfonamide Derivatives Other (See Comments)    Reaction: per patient "tears her stomach up"  . Tetracycline Hives     Medication List    TAKE these medications        apixaban 5 MG Tabs tablet  Commonly known as:  ELIQUIS  Take 1 tablet (5 mg total) by mouth 2 (two) times daily.     azaTHIOprine 50 MG tablet  Commonly known as:  IMURAN  Take 125 mg by mouth daily.     calcitRIOL 0.25 MCG capsule  Commonly known as:  ROCALTROL  Take 0.25 mcg by mouth daily.     clopidogrel 75 MG tablet  Commonly known as:  PLAVIX  Take 1 tablet (75 mg total) by mouth daily.     cyclobenzaprine 10 MG tablet  Commonly known as:  FLEXERIL  Take 10 mg by mouth 3 (three) times daily.     diltiazem 120 MG 24 hr capsule  Commonly known as:  CARDIZEM CD  Take 1 capsule (120 mg total) by mouth daily.     DULoxetine 30 MG capsule  Commonly known as:  CYMBALTA  Take 30 mg by mouth daily.     fluticasone 50 MCG/ACT nasal spray  Commonly known as:  FLONASE  PLACE 2 SPRAYS INTO THE NOSE DAILY      LORazepam 1 MG tablet  Commonly known as:  ATIVAN  Take 1 mg by mouth daily.     metoprolol 50 MG tablet  Commonly known as:  LOPRESSOR  Take 1 tablet (50 mg total) by mouth 2 (two) times daily.     montelukast 10 MG tablet  Commonly known as:  SINGULAIR  TAKE 1 TABLET ONCE A DAY     nitroGLYCERIN 0.4 MG SL tablet  Commonly known as:  NITROSTAT  Place 1 tablet (0.4 mg total) under the tongue every 5 (five) minutes as needed for chest pain.     omeprazole 40 MG capsule  Commonly known as:  PRILOSEC  Take 40 mg by mouth daily.     predniSONE 5 MG tablet  Commonly known as:  DELTASONE  Take 5  mg by mouth daily.     promethazine 25 MG tablet  Commonly known as:  PHENERGAN  Take 25 mg by mouth every 6 (six) hours as needed. For nausea and sleep     rosuvastatin 20 MG tablet  Commonly known as:  CRESTOR  Take 1 tablet (20 mg total) by mouth daily.     sertraline 100 MG tablet  Commonly known as:  ZOLOFT  Take 200 mg by mouth daily.        Discharge Instructions    Diet - low sodium heart healthy    Complete by:  As directed      Increase activity slowly    Complete by:  As directed           Follow-up Information    Follow up with [facility] R, NP On 06/18/2015.   Specialty:  Cardiology   Why:  at 10:30 AM   Contact information:   [address] [place] [address] [phone]       BRING ALL MEDICATIONS WITH YOU TO FOLLOW UP APPOINTMENTS  Time spent with patient to include physician time: 48 min Signed: Rosaria Ferries, PA-C 06/29/2014, 5:06 PM Co-Sign MD

## 2014-07-03 ENCOUNTER — Encounter (HOSPITAL_COMMUNITY): Payer: Medicare Other

## 2014-07-03 ENCOUNTER — Telehealth: Payer: Self-pay | Admitting: Cardiology

## 2014-07-03 DIAGNOSIS — R5383 Other fatigue: Secondary | ICD-10-CM

## 2014-07-03 DIAGNOSIS — R002 Palpitations: Secondary | ICD-10-CM

## 2014-07-03 DIAGNOSIS — I493 Ventricular premature depolarization: Secondary | ICD-10-CM

## 2014-07-03 DIAGNOSIS — I4891 Unspecified atrial fibrillation: Secondary | ICD-10-CM

## 2014-07-03 DIAGNOSIS — I480 Paroxysmal atrial fibrillation: Secondary | ICD-10-CM

## 2014-07-03 MED ORDER — METOPROLOL TARTRATE 25 MG PO TABS: 37.5000 mg | ORAL_TABLET | Freq: Two times a day (BID) | ORAL | Status: DC

## 2014-07-03 NOTE — Telephone Encounter (Signed)
Spoke to patient She states she does not know if cardionet senisor for monitor is working.  RN informed patient to contact [employer] to see if reports has been received by the [place].   Patient states  Her blood pressure is 86/42 yesterday, 90/50 today- heart rate 52 - METOPROLOL 50 MG TWICE A DAY She states she feels little dizzy and weak.she recently was released from the hospital. Will  Defer to Dr Mirna Mires NP

## 2014-07-03 NOTE — Telephone Encounter (Signed)
Spoke to patient RN asked patient to get a piece paper- instruction given-she voiced understanding. RN change medication  list

## 2014-07-03 NOTE — Telephone Encounter (Signed)
I agree with large recommendations  [facility]

## 2014-07-03 NOTE — Telephone Encounter (Signed)
Ms.Horton is calling because she has on a halter monitor the thing that goes around her neck that transmit when she puts the battery in the lights comes on green and then turns yellow , then it goes off completely .[name] Kitchen Please call    Thanks

## 2014-07-03 NOTE — Telephone Encounter (Signed)
Decrease her metoprolol to 37.5 mg BID,  She may still have 25 mg tabs at home to see if this will help.  Call if BP remains low, for tonight only take 12.5 mg of metoprolol or none if BP still in the 80s.

## 2014-07-04 ENCOUNTER — Telehealth: Payer: Self-pay | Admitting: *Deleted

## 2014-07-04 DIAGNOSIS — I6521 Occlusion and stenosis of right carotid artery: Secondary | ICD-10-CM

## 2014-07-04 DIAGNOSIS — I6522 Occlusion and stenosis of left carotid artery: Secondary | ICD-10-CM

## 2014-07-04 NOTE — Telephone Encounter (Signed)
Spoke to patient. Result given . Verbalized understanding RECHECK IN 2016

## 2014-07-04 NOTE — Telephone Encounter (Signed)
-----   Message from [name], MD sent at 07/02/2014 11:58 AM EST ----- Carotid Dopplers 06/21/14 R Bulb/Prox ICA 50-69% diameter reduction -- represents mild incremental increase in velocities. Recheck in 12 months.  [name], MD

## 2014-07-06 DIAGNOSIS — Z9884 Bariatric surgery status: Secondary | ICD-10-CM | POA: Diagnosis not present

## 2014-07-09 ENCOUNTER — Encounter: Payer: Self-pay | Admitting: Cardiology

## 2014-07-17 ENCOUNTER — Ambulatory Visit: Payer: Medicare Other | Admitting: Cardiology

## 2014-07-17 ENCOUNTER — Telehealth (HOSPITAL_COMMUNITY): Payer: Self-pay | Admitting: *Deleted

## 2014-07-18 ENCOUNTER — Telehealth: Payer: Self-pay | Admitting: *Deleted

## 2014-07-18 NOTE — Telephone Encounter (Signed)
Monitor reviewed by dr Stanford Breed shows PAF; WCT (possible aberrancy). Patient has a follow up 07-27-14 post hosp Patient is current on anticoagulant.

## 2014-07-24 ENCOUNTER — Other Ambulatory Visit: Payer: Self-pay | Admitting: Emergency Medicine

## 2014-07-26 ENCOUNTER — Encounter (HOSPITAL_COMMUNITY): Payer: Self-pay | Admitting: Cardiovascular Disease

## 2014-07-27 ENCOUNTER — Ambulatory Visit: Payer: Medicare Other | Admitting: Cardiology

## 2014-07-28 ENCOUNTER — Emergency Department (HOSPITAL_COMMUNITY): Payer: Medicare Other

## 2014-07-28 ENCOUNTER — Encounter (HOSPITAL_COMMUNITY): Payer: Self-pay | Admitting: *Deleted

## 2014-07-28 ENCOUNTER — Emergency Department (HOSPITAL_COMMUNITY)
Admission: EM | Admit: 2014-07-28 | Discharge: 2014-07-28 | Disposition: A | Discharge status: Home / Self Care | Payer: Medicare Other | Attending: Emergency Medicine | Admitting: Emergency Medicine

## 2014-07-28 DIAGNOSIS — Z872 Personal history of diseases of the skin and subcutaneous tissue: Secondary | ICD-10-CM | POA: Insufficient documentation

## 2014-07-28 DIAGNOSIS — Z940 Kidney transplant status: Secondary | ICD-10-CM | POA: Insufficient documentation

## 2014-07-28 DIAGNOSIS — I120 Hypertensive chronic kidney disease with stage 5 chronic kidney disease or end stage renal disease: Secondary | ICD-10-CM | POA: Insufficient documentation

## 2014-07-28 DIAGNOSIS — Z87891 Personal history of nicotine dependence: Secondary | ICD-10-CM | POA: Insufficient documentation

## 2014-07-28 DIAGNOSIS — R1011 Right upper quadrant pain: Secondary | ICD-10-CM | POA: Insufficient documentation

## 2014-07-28 DIAGNOSIS — I2510 Atherosclerotic heart disease of native coronary artery without angina pectoris: Secondary | ICD-10-CM | POA: Diagnosis not present

## 2014-07-28 DIAGNOSIS — F418 Other specified anxiety disorders: Secondary | ICD-10-CM | POA: Diagnosis not present

## 2014-07-28 DIAGNOSIS — R109 Unspecified abdominal pain: Secondary | ICD-10-CM | POA: Diagnosis not present

## 2014-07-28 DIAGNOSIS — E669 Obesity, unspecified: Secondary | ICD-10-CM | POA: Insufficient documentation

## 2014-07-28 DIAGNOSIS — G4733 Obstructive sleep apnea (adult) (pediatric): Secondary | ICD-10-CM | POA: Insufficient documentation

## 2014-07-28 DIAGNOSIS — Z79899 Other long term (current) drug therapy: Secondary | ICD-10-CM | POA: Insufficient documentation

## 2014-07-28 DIAGNOSIS — Z9861 Coronary angioplasty status: Secondary | ICD-10-CM | POA: Insufficient documentation

## 2014-07-28 DIAGNOSIS — Z951 Presence of aortocoronary bypass graft: Secondary | ICD-10-CM | POA: Insufficient documentation

## 2014-07-28 DIAGNOSIS — Z7951 Long term (current) use of inhaled steroids: Secondary | ICD-10-CM | POA: Insufficient documentation

## 2014-07-28 DIAGNOSIS — I739 Peripheral vascular disease, unspecified: Secondary | ICD-10-CM | POA: Insufficient documentation

## 2014-07-28 DIAGNOSIS — R1013 Epigastric pain: Secondary | ICD-10-CM | POA: Insufficient documentation

## 2014-07-28 DIAGNOSIS — Z9889 Other specified postprocedural states: Secondary | ICD-10-CM | POA: Diagnosis not present

## 2014-07-28 DIAGNOSIS — K219 Gastro-esophageal reflux disease without esophagitis: Secondary | ICD-10-CM | POA: Insufficient documentation

## 2014-07-28 DIAGNOSIS — Z9981 Dependence on supplemental oxygen: Secondary | ICD-10-CM | POA: Insufficient documentation

## 2014-07-28 DIAGNOSIS — M797 Fibromyalgia: Secondary | ICD-10-CM | POA: Diagnosis not present

## 2014-07-28 DIAGNOSIS — E119 Type 2 diabetes mellitus without complications: Secondary | ICD-10-CM | POA: Diagnosis not present

## 2014-07-28 DIAGNOSIS — N186 End stage renal disease: Secondary | ICD-10-CM | POA: Insufficient documentation

## 2014-07-28 DIAGNOSIS — Z7902 Long term (current) use of antithrombotics/antiplatelets: Secondary | ICD-10-CM | POA: Insufficient documentation

## 2014-07-28 DIAGNOSIS — R195 Other fecal abnormalities: Secondary | ICD-10-CM | POA: Diagnosis not present

## 2014-07-28 DIAGNOSIS — Z9851 Tubal ligation status: Secondary | ICD-10-CM | POA: Insufficient documentation

## 2014-07-28 DIAGNOSIS — D649 Anemia, unspecified: Secondary | ICD-10-CM | POA: Diagnosis not present

## 2014-07-28 DIAGNOSIS — I480 Paroxysmal atrial fibrillation: Secondary | ICD-10-CM | POA: Insufficient documentation

## 2014-07-28 DIAGNOSIS — I252 Old myocardial infarction: Secondary | ICD-10-CM | POA: Diagnosis not present

## 2014-07-28 DIAGNOSIS — Z8614 Personal history of Methicillin resistant Staphylococcus aureus infection: Secondary | ICD-10-CM | POA: Diagnosis not present

## 2014-07-28 DIAGNOSIS — E785 Hyperlipidemia, unspecified: Secondary | ICD-10-CM | POA: Insufficient documentation

## 2014-07-28 DIAGNOSIS — J449 Chronic obstructive pulmonary disease, unspecified: Secondary | ICD-10-CM | POA: Diagnosis not present

## 2014-07-28 DIAGNOSIS — Z9884 Bariatric surgery status: Secondary | ICD-10-CM | POA: Diagnosis not present

## 2014-07-28 DIAGNOSIS — R1084 Generalized abdominal pain: Secondary | ICD-10-CM | POA: Diagnosis not present

## 2014-07-28 DIAGNOSIS — K8020 Calculus of gallbladder without cholecystitis without obstruction: Secondary | ICD-10-CM | POA: Diagnosis not present

## 2014-07-28 DIAGNOSIS — Z7952 Long term (current) use of systemic steroids: Secondary | ICD-10-CM | POA: Diagnosis not present

## 2014-07-28 DIAGNOSIS — M069 Rheumatoid arthritis, unspecified: Secondary | ICD-10-CM | POA: Diagnosis not present

## 2014-07-28 LAB — LACTIC ACID, PLASMA: LACTIC ACID, VENOUS: 1.7 mmol/L (ref 0.5–2.2)

## 2014-07-28 LAB — COMPREHENSIVE METABOLIC PANEL
ALT: 27 U/L (ref 0–35)
AST: 78 U/L — ABNORMAL HIGH (ref 0–37)
Albumin: 3.5 g/dL (ref 3.5–5.2)
Alkaline Phosphatase: 183 U/L — ABNORMAL HIGH (ref 39–117)
Anion gap: 15 (ref 5–15)
BILIRUBIN TOTAL: 0.7 mg/dL (ref 0.3–1.2)
BUN: 12 mg/dL (ref 6–23)
CHLORIDE: 97 meq/L (ref 96–112)
CO2: 25 meq/L (ref 19–32)
Calcium: 9.8 mg/dL (ref 8.4–10.5)
Creatinine, Ser: 0.68 mg/dL (ref 0.50–1.10)
GFR calc Af Amer: >90 mL/min (ref >90)
GLUCOSE: 155 mg/dL — AB (ref 70–99)
Potassium: 3.8 mEq/L (ref 3.7–5.3)
SODIUM: 137 meq/L (ref 137–147)
Total Protein: 7.1 g/dL (ref 6.0–8.3)

## 2014-07-28 LAB — CBC WITH DIFFERENTIAL/PLATELET
Basophils Absolute: 0 10*3/uL (ref 0.0–0.1)
Basophils Relative: 0 % (ref 0–1)
Eosinophils Absolute: 0.1 10*3/uL (ref 0.0–0.7)
Eosinophils Relative: 1 % (ref 0–5)
HEMATOCRIT: 30.1 % — AB (ref 36.0–46.0)
Hemoglobin: 9.6 g/dL — ABNORMAL LOW (ref 12.0–15.0)
LYMPHS ABS: 1.3 10*3/uL (ref 0.7–4.0)
LYMPHS PCT: 13 % (ref 12–46)
MCH: 29.7 pg (ref 26.0–34.0)
MCHC: 31.9 g/dL (ref 30.0–36.0)
MCV: 93.2 fL (ref 78.0–100.0)
MONO ABS: 0.7 10*3/uL (ref 0.1–1.0)
Monocytes Relative: 7 % (ref 3–12)
NEUTROS ABS: 7.8 10*3/uL — AB (ref 1.7–7.7)
Neutrophils Relative %: 79 % — ABNORMAL HIGH (ref 43–77)
Platelets: 228 10*3/uL (ref 150–400)
RBC: 3.23 MIL/uL — AB (ref 3.87–5.11)
RDW: 14.6 % (ref 11.5–15.5)
WBC: 9.9 10*3/uL (ref 4.0–10.5)

## 2014-07-28 LAB — LIPASE, BLOOD: Lipase: 23 U/L (ref 11–59)

## 2014-07-28 LAB — POC OCCULT BLOOD, ED: Fecal Occult Bld: POSITIVE — AB

## 2014-07-28 MED ORDER — AMOXICILLIN-POT CLAVULANATE 875-125 MG PO TABS: 1.0000 | ORAL_TABLET | Freq: Two times a day (BID) | ORAL | Status: DC

## 2014-07-28 MED ORDER — FENTANYL CITRATE 0.05 MG/ML IJ SOLN
50.0000 ug | Freq: Once | INTRAMUSCULAR | Status: AC
Start: 2014-07-28 — End: 2014-07-28
  Administered 2014-07-28: 50 ug via INTRAVENOUS
  Filled 2014-07-28: qty 2

## 2014-07-28 MED ORDER — FENTANYL CITRATE 0.05 MG/ML IJ SOLN
50.0000 ug | Freq: Once | INTRAMUSCULAR | Status: AC
  Administered 2014-07-28: 50 ug via INTRAVENOUS
  Filled 2014-07-28: qty 2

## 2014-07-28 MED ORDER — OXYCODONE-ACETAMINOPHEN 7.5-325 MG PO TABS: 1.0000 | ORAL_TABLET | Freq: Four times a day (QID) | ORAL | Status: DC | PRN

## 2014-07-28 MED ORDER — ONDANSETRON HCL 4 MG/2ML IJ SOLN
4.0000 mg | Freq: Once | INTRAMUSCULAR | Status: AC
  Administered 2014-07-28: 4 mg via INTRAVENOUS
  Filled 2014-07-28: qty 2

## 2014-07-28 MED ORDER — FENTANYL CITRATE 0.05 MG/ML IJ SOLN: 25.0000 ug | Freq: Once | INTRAMUSCULAR | Status: DC

## 2014-07-28 NOTE — ED Notes (Signed)
Pt. Left with all belongings 

## 2014-07-28 NOTE — ED Notes (Signed)
Attempt IV start x1, unsuccessful.  Will notify primary RN

## 2014-07-28 NOTE — ED Notes (Signed)
IV team called to report possible delay.

## 2014-07-28 NOTE — ED Notes (Signed)
Pt is here by EMS for RLQ pain x 5 hours.  Hx of lap band surgery

## 2014-07-28 NOTE — ED Provider Notes (Signed)
CSN: [id number]     Arrival date & time 07/28/14  [id number] History   First MD Initiated Contact with Patient 07/28/14 1916     Chief Complaint  Patient presents with  . Abdominal Pain   HPI  Katherine [name] is a 58 year old female who presents with abdominal pain. Around 1pm, she ate a hamburger after which she developed a "knife in the gut" pain that localizes to the epigastric and RUQ areas. She tried Phenergen without much relief. About a week ago, she reports Dr. Excell Seltzer released a lap band for fluid buildup, and an associate at his office today instructed her to come back into the ED to be evaluated. She does take Eliquis and Plavix for a-fib and CAD s/p multiple stents though stopped Eliquis for the last four days because of some bloody diarrhea she observed. Only past surgical history she has is kidney transplant. Otherwise, she denies fever, vomiting, nausea, prior gallbladder/appendix surgery.    Past Medical History  Diagnosis Date  . Glomerulonephritis, chronic, rapidly progressive 24  . ESRD (end stage renal disease) 1991    s/p Cadaveric Renal Transplant [facility] - Dr. Jimmy Footman)   . S/p cadaver renal transplant [place]  . 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 07/1993    Rescue PTCA of RCA -- referred for CABG.  . CAD in native artery 07/1993     3 Vessel Disease (LAD-D1 & RCA) -- CABG  . 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 Servando Snare: LIMA-LAD, SVG-bifurcatingD1, SVG-rPDA  . CAD (coronary artery disease) of bypass graft 5/01; 3/'02, 8/'03, 10/'04; 1/15    PCI x 5 to SVG-D1   . CAD S/P percutaneous coronary angioplasty     PCI to SVG-D1 insertion/native D1 x 4 = '01 -(S660 BMS 2.5 x 9 - insertion into D1; '02 - distal overlap ACS Pixel 2.5 x 8  BMS; '03 distal/native ISR/Thrombosis - Pixel 2.5 x 13; '04 - ISR-  Taxus 2.5 x 20 (covered all);; 1/15 - mid SVG-D1 (50% distal ISR) - Promus P 2.75 x 20 -- 2.8 mm  . Unstable angina 5/01; 3/'02, 8/'03, 10/'04; 1/15     x 5 occurences since Inf-Post STEMI in 1994  . Diabetes mellitus type 2 in obese   . COPD mixed type     Followed by Dr. Lamonte Sakai  . OSA on CPAP   . Dyslipidemia, goal LDL below 70     08/2012: TC 137, TG 200, HDL 32!, LDL 45; on statin (followed by Dr.Deterding)  . Bilateral carotid artery stenosis 07/2009    Mild - moderate ~<0-49% internal carotid bilaterally; right bullb 50-69%  . Obesity     s/p Lap Band 04/2004- Port Replacement 10/09 & 2/10 for infection; Dr. Excell Seltzer.  [name] Kitchen MRSA (methicillin resistant staph aureus) culture positive   . Hypertension associated with diabetes   . Depression with anxiety   . Recurrent boils     Bilateral Groin  . Fibromyalgia   . Rheumatoid arthritis     Per Patient Report; associated with OA  . H/O: GI bleed   . PAF (paroxysmal atrial fibrillation)     Noted on [employer] Monitor 11/5-18 - rate ~112  . PAD (peripheral artery disease) 08/2013    LEA Dopplers to be read by Dr. Fletcher Anon   Past Surgical History  Procedure Laterality Date  . Laparoscopic gastric banding  04/2004; 10/'09, 2/'10    Port Replacement x 2  . Catheter removal    .  Coronary artery bypass graft  1995    LIMA-LAD, SVG-RPDA, SVG-D1  . Kidney transplant  1991  . Wrist fistula repair    . Tubal ligation    . Cardiac catheterization  5/'01, 3/'02, 8/'03, 10/'04; 1/'15    08/22/2013: LAD & RCA 100%; LIMA-LAD & SVG-rPDA patent; Cx-- OM1 60%, OM2 ostial ~50%; SVG-D1 - 80% mid, 50% distal ISR --PCI  . Port-a-cath removal      kidney  . Incise and drain abcess    . Cardiac catheterization  5/'01, 3/'02, 8/'03, 10/'04; 1/'15    08/22/2013: LAD & RCA 100%; LIMA-LAD & SVG-rPDA patent; Cx-- OM1 60%, OM2 ostial ~50%; SVG-D1 - 80% mid, 50% distal ISR --PCI  . Percutaneous coronary stent intervention (pci-s)  5/'01, 3/'02, 8/'03, 10/'04;    '01 - S660 BMS 2.5 x 9 - dSVG-D1 into D1; '02- post-stent stenosis - 2.5 x 8 Pixel BMS; '8\03: ISR/Thrombosis into native D1 - AngioJet, 2.5 x 13 Pixel; '04  - ISR 95% - covered stented area with Taxus DES 2.5 mm x 20 (2.88)  . Percutaneous coronary stent intervention (pci-s)  08/22/2013    mid SVG-D1 80%; distal stent ~50% ISR; Promus Prermier DES 2.75 mm xc 20 mm (2.8 mm)  . Transthoracic echocardiogram  02/2013    NL LV Size, EF 55-60%; MAC with Mild MR.  . Lower extremity arterial dopplers  08/2013    ABI: R 0.96, L 1.04  . Left heart catheterization with coronary/graft angiogram N/A 08/23/2013    Procedure: LEFT HEART CATHETERIZATION WITH Beatrix Fetters;  Surgeon: Wellington Hampshire, MD;  Location: [facility] CATH LAB;  Service: Cardiovascular;  Laterality: N/A;  . Percutaneous coronary stent intervention (pci-s)  08/23/2013    Procedure: PERCUTANEOUS CORONARY STENT INTERVENTION (PCI-S);  Surgeon: Wellington Hampshire, MD;  Location: [facility] CATH LAB;  Service: Cardiovascular;;   Family History  Problem Relation Age of Onset  . Cancer Mother     liver  . Heart disease Father   . Cancer Father     colon  . Arrhythmia Brother     Atrial Fibrillation  . Arrhythmia Paternal Aunt     Atrial Fibrillation   History  Substance Use Topics  . Smoking status: Former Smoker -- 1.00 packs/day for 30 years    Types: Cigarettes    Quit date: 08/17/2002  . Smokeless tobacco: Never Used  . Alcohol Use: No   OB History    No data available     Review of Systems  Constitutional: Negative for fever.  Gastrointestinal: Positive for abdominal pain. Negative for nausea, vomiting and diarrhea.  Genitourinary: Negative for dysuria.      Allergies  Codeine; Erythromycin; Hydromorphone hcl; Nalbuphine; Niaspan; Sulfonamide derivatives; and Tetracycline  Home Medications   Prior to Admission medications   Medication Sig Start Date End Date Taking? Authorizing Provider  azaTHIOprine (IMURAN) 50 MG tablet Take 125 mg by mouth daily.   Yes Historical Provider, MD  calcitRIOL (ROCALTROL) 0.25 MCG capsule Take 0.25 mcg by mouth daily.   Yes Historical Provider, MD   clopidogrel (PLAVIX) 75 MG tablet Take 1 tablet (75 mg total) by mouth daily. 04/12/14  Yes [name], MD  cyclobenzaprine (FLEXERIL) 10 MG tablet Take 10 mg by mouth at bedtime.    Yes Historical Provider, MD  diltiazem (CARDIZEM CD) 120 MG 24 hr capsule Take 1 capsule (120 mg total) by mouth daily. 06/26/14  Yes Luke K Kilroy, PA-C  DULoxetine (CYMBALTA) 30 MG capsule Take 30  mg by mouth daily.     Yes Historical Provider, MD  fluticasone (FLONASE) 50 MCG/ACT nasal spray PLACE 2 SPRAYS INTO THE NOSE DAILY 06/14/14  Yes Collene Gobble, MD  LORazepam (ATIVAN) 1 MG tablet Take 1 mg by mouth daily.    Yes Historical Provider, MD  metoprolol tartrate (LOPRESSOR) 25 MG tablet Take 1.5 tablets (37.5 mg total) by mouth 2 (two) times daily. Patient taking differently: Take 25 mg by mouth at bedtime.  07/03/14  Yes Isaiah Serge, NP  montelukast (SINGULAIR) 10 MG tablet TAKE 1 TABLET ONCE A DAY 06/14/14  Yes Collene Gobble, MD  omeprazole (PRILOSEC) 40 MG capsule Take 40 mg by mouth daily.     Yes Historical Provider, MD  predniSONE (DELTASONE) 5 MG tablet Take 5 mg by mouth daily.     Yes Historical Provider, MD  promethazine (PHENERGAN) 25 MG tablet Take 25 mg by mouth at bedtime. For nausea and sleep   Yes Historical Provider, MD  rosuvastatin (CRESTOR) 20 MG tablet Take 1 tablet (20 mg total) by mouth daily. 06/21/14  Yes Luke K Kilroy, PA-C  sertraline (ZOLOFT) 100 MG tablet Take 200 mg by mouth daily.   Yes Historical Provider, MD  amoxicillin-clavulanate (AUGMENTIN) 875-125 MG per tablet Take 1 tablet by mouth 2 (two) times daily. 07/28/14   Charlott Rakes, MD  apixaban (ELIQUIS) 5 MG TABS tablet Take 1 tablet (5 mg total) by mouth 2 (two) times daily. Patient not taking: Reported on 06/28/2014 06/26/14   Erlene Quan, PA-C  metoprolol tartrate (LOPRESSOR) 50 MG tablet Take 1 tablet (50 mg total) by mouth 2 (two) times daily. 06/29/14   Rhonda G Barrett, PA-C  nitroGLYCERIN (NITROSTAT) 0.4 MG  SL tablet Place 1 tablet (0.4 mg total) under the tongue every 5 (five) minutes as needed for chest pain. 09/11/13   [name], MD  oxyCODONE-acetaminophen (PERCOCET) 7.5-325 MG per tablet Take 1 tablet by mouth every 6 (six) hours as needed for pain. 07/28/14   Charlott Rakes, MD   BP 115/51 mmHg  Pulse 52  Temp(Src) 98.6 F (37 C) (Oral)  Resp 17  Ht 5\' 1"  (1.549 m)  Wt 185 lb (83.915 kg)  BMI 34.97 kg/m2  SpO2 100% Physical Exam  Constitutional: She is oriented to person, place, and time. She appears well-developed and well-nourished. No distress.  HENT:  Head: Normocephalic and atraumatic.  Mouth/Throat: Oropharynx is clear and moist. No oropharyngeal exudate.  Eyes: Conjunctivae are normal. Pupils are equal, round, and reactive to light.  Cardiovascular: Normal rate and regular rhythm.  Exam reveals no gallop and no friction rub.   No murmur heard. Pulmonary/Chest: Effort normal and breath sounds normal. No respiratory distress. She has no wheezes. She has no rales.  Abdominal: Soft. Bowel sounds are normal. She exhibits no distension. There is tenderness. There is no rebound and no guarding.  Tenderness with mild palpation of epigastric, RUQ pain.  Neurological: She is alert and oriented to person, place, and time.  Skin: She is not diaphoretic.    ED Course  Procedures (including critical care time) Labs Review Labs Reviewed  CBC WITH DIFFERENTIAL - Abnormal; Notable for the following:    RBC 3.23 (*)    Hemoglobin 9.6 (*)    HCT 30.1 (*)    Neutrophils Relative % 79 (*)    Neutro Abs 7.8 (*)    All other components within normal limits  COMPREHENSIVE METABOLIC PANEL - Abnormal; Notable for the following:  Glucose, Bld 155 (*)    AST 78 (*)    Alkaline Phosphatase 183 (*)    All other components within normal limits  POC OCCULT BLOOD, ED - Abnormal; Notable for the following:    Fecal Occult Bld POSITIVE (*)    All other components within normal limits   LIPASE, BLOOD  LACTIC ACID, PLASMA  OCCULT BLOOD X 1 CARD TO LAB, STOOL    Imaging Review Ct Abdomen Pelvis Wo Contrast  07/28/2014   CLINICAL DATA:  Right lower quadrant pain for 5 hr with bloody stool and rectum and some constipation for 3 days. History of GI bleed, lap band, kidney transplant, heart surgery, stent placement.  EXAM: CT ABDOMEN AND PELVIS WITHOUT CONTRAST  TECHNIQUE: Multidetector CT imaging of the abdomen and pelvis was performed following the standard protocol without IV contrast.  COMPARISON:  06/04/2008  FINDINGS: Lung bases are clear. Gallbladder is mildly distended with a small stone demonstrated in the dependent portion. No infiltration or wall thickening. No bile duct dilatation. Unenhanced appearance of the liver, spleen, adrenal glands, inferior vena cava, and retroperitoneal lymph nodes is unremarkable. Calcification throughout the abdominal aorta and iliac arteries. Marked bilateral renal atrophy. Fatty infiltration of the body and tail of the pancreas. Gastric lap band appears to be appropriately positioned. Stomach, small bowel, and colon appear otherwise unremarkable. No free air or free fluid in the abdomen.  Pelvis: Appendix is normal. Left pelvic transplant kidney without hydronephrosis. Surgical absence of the uterus. Enlarged right ovary at 4 x 6.6 cm, increasing since previous study. Density measurements consistent with fat. This may represent a dermoid cyst. Consider ultrasound for further evaluation. Mild thickening of the bladder wall may indicate infection or under distention. No free or loculated pelvic fluid collections. No evidence of diverticulitis. No significant pelvic lymphadenopathy. Degenerative changes no destructive bone lesions.  IMPRESSION: Gastric lap band appears in appropriate position. Tiny gallstone with mild gallbladder distention. No inflammatory changes. Bladder wall thickening suggesting infection or under distention. Enlarging right ovary  possibly containing fat. Consider dermoid cyst. Consider ultrasound for further evaluation.   Electronically Signed   By: Lucienne Capers M.D.   On: 07/28/2014 23:06     EKG Interpretation None      MDM   Final diagnoses:  Dyspepsia  Anemia, unspecified anemia type    PUD possible given that pain followed by food. Post-op complication possible as well as she is in distress though hemodynamically stable otherwise. Acute cholecystitis and acute pancreatitis also are in the differential. Ischemic bowel possible as well given her vascular disease though she is hemodynamically stable for the moment. Will order CBC, CMET, lipase, lactate, ab CT.  1008PM: Hb 9, down from 12 last month. FOBT trace positive. Ab CT pending.   1115PM: Spoke with Dr. Hulen Skains who feels there's no acute surgical intervention to be done as ab CT is otherwise unremarkable. Advised patient to avoid hamburgers in the future and to call Dr. Excell Seltzer on Monday about the anemia. She does report she is having sinus infection and insists on antibiotics. I will give Augmentin & Percocet.   Charlott Rakes, MD 07/28/14 [place], MD 08/04/14 [phone]

## 2014-08-01 ENCOUNTER — Encounter: Payer: Self-pay | Admitting: Cardiology

## 2014-08-01 ENCOUNTER — Ambulatory Visit (INDEPENDENT_AMBULATORY_CARE_PROVIDER_SITE_OTHER): Payer: Medicare Other | Admitting: Cardiology

## 2014-08-01 VITALS — BP 135/74 | HR 76 | Ht 61.0 in | Wt 192.6 lb

## 2014-08-01 DIAGNOSIS — I739 Peripheral vascular disease, unspecified: Secondary | ICD-10-CM

## 2014-08-01 DIAGNOSIS — I10 Essential (primary) hypertension: Secondary | ICD-10-CM

## 2014-08-01 DIAGNOSIS — E785 Hyperlipidemia, unspecified: Secondary | ICD-10-CM

## 2014-08-01 DIAGNOSIS — I6529 Occlusion and stenosis of unspecified carotid artery: Secondary | ICD-10-CM

## 2014-08-01 DIAGNOSIS — I152 Hypertension secondary to endocrine disorders: Secondary | ICD-10-CM

## 2014-08-01 DIAGNOSIS — I480 Paroxysmal atrial fibrillation: Secondary | ICD-10-CM

## 2014-08-01 DIAGNOSIS — Z9884 Bariatric surgery status: Secondary | ICD-10-CM

## 2014-08-01 DIAGNOSIS — Z9861 Coronary angioplasty status: Secondary | ICD-10-CM

## 2014-08-01 DIAGNOSIS — E1159 Type 2 diabetes mellitus with other circulatory complications: Secondary | ICD-10-CM

## 2014-08-01 DIAGNOSIS — E1169 Type 2 diabetes mellitus with other specified complication: Secondary | ICD-10-CM

## 2014-08-01 DIAGNOSIS — I25810 Atherosclerosis of coronary artery bypass graft(s) without angina pectoris: Secondary | ICD-10-CM

## 2014-08-01 DIAGNOSIS — I2510 Atherosclerotic heart disease of native coronary artery without angina pectoris: Secondary | ICD-10-CM | POA: Diagnosis not present

## 2014-08-01 DIAGNOSIS — I493 Ventricular premature depolarization: Secondary | ICD-10-CM | POA: Diagnosis not present

## 2014-08-01 DIAGNOSIS — I6521 Occlusion and stenosis of right carotid artery: Secondary | ICD-10-CM

## 2014-08-01 DIAGNOSIS — K625 Hemorrhage of anus and rectum: Secondary | ICD-10-CM

## 2014-08-01 DIAGNOSIS — I6523 Occlusion and stenosis of bilateral carotid arteries: Secondary | ICD-10-CM

## 2014-08-01 NOTE — Patient Instructions (Signed)
You have been referred to  Dr [name]- FOR RECTAL BLEEDING , Katherine [name].  Your physician wants you to follow-up in [place] [name].  You will receive a reminder letter in the mail two months in advance. If you don't receive a letter, please call our office to schedule the follow-up appointment.

## 2014-08-03 ENCOUNTER — Other Ambulatory Visit: Payer: Self-pay | Admitting: Emergency Medicine

## 2014-08-03 ENCOUNTER — Other Ambulatory Visit (INDEPENDENT_AMBULATORY_CARE_PROVIDER_SITE_OTHER): Payer: Self-pay | Admitting: General Surgery

## 2014-08-03 DIAGNOSIS — Z940 Kidney transplant status: Secondary | ICD-10-CM | POA: Diagnosis not present

## 2014-08-03 DIAGNOSIS — K625 Hemorrhage of anus and rectum: Secondary | ICD-10-CM | POA: Insufficient documentation

## 2014-08-03 DIAGNOSIS — K8010 Calculus of gallbladder with chronic cholecystitis without obstruction: Secondary | ICD-10-CM | POA: Diagnosis not present

## 2014-08-03 NOTE — Assessment & Plan Note (Addendum)
She now has had a recurrence of A. Fib. She does have a relatively high CHA2DS2VASC score with PAD/CAD, HTN & female & justifiedly was recommended to start Eliquis.  Unfortunately,she had bloody stools & stopped taking it along with Plavix. For now, we will continue Plavix alone & refer her back to her GI MD (Dr. [name]) to evaluate the source of her GIB. If stable - would like to restart Eliquis.  We will discuss this at followup visit. For now we'll continue with current dose of metoprolol plus diltiazem for rate control. Unless she has significant recurrences, I would not consider rhythm control.  The longer she goes without recurrence may see more comfortable with considering Plavix alone.

## 2014-08-03 NOTE — Assessment & Plan Note (Signed)
Moderate disease by recent Doppler. Plan is to repeat in one year.

## 2014-08-03 NOTE — Assessment & Plan Note (Signed)
She was due to see Dr. Fletcher Anon to discuss results upper lower extremity and carotid Dopplers. She also has moderate carotid disease.  Nothing that at this point would warrant invasive evaluation. She does not have claudication.

## 2014-08-03 NOTE — Assessment & Plan Note (Signed)
Reportedly, her nephrologist is following her cholesterol labs. She is on Crestor. I don't have any recent lab results.

## 2014-08-03 NOTE — Assessment & Plan Note (Signed)
Unfortunately with her multiple PCI's/stents, I really think she needs to be on antiplatelet agent. She is less than 1 year status post recent PCI and therefore I would be reluctant to stop Plavix.  At this point, as she is  Almost one year out from her last stent, single medication antiplatelet therapy for preventative maintenance he is what I'm considering, torn with the need for her to be on full anticoagulation as well.

## 2014-08-03 NOTE — Progress Notes (Signed)
PCP: No PCP Per Patient  Clinic Note: Chief Complaint  Patient presents with  . other    pt denies chest pain, sob, and swelling   HPI: Katherine [name] is a 58 y.o. female with a PMH below who presents today for what amounts to 1 year followup for me, but she has been seen several times by NP/PAs.  She has also had several recent hospitalizations. She is a patient of Dr. Delfino Lovett one drop. She actually had unstable angina back in January at 2015 and had PCI of the vein graft to the diagonal. In the past he had a reported history of PAF, however she now recently has had an episode.  She had a walk-in visit in October, complaining of palpitations that sound like potential anxiety attacks. There was a consideration of possibly using Xanax. Recommendation by Dr. Sallyanne Kuster it was a doctor of the day was to take metoprolol 12 mg twice a day.she was then seen by Kerin Ransom in November for palpitations based on the recommendations from her nephrologist, Dr. Jimmy Footman.  Her beta blocker dose was increased to 25 mg in the morning and 12 at night. He also recommended a two-week event monitor and was in [place]. On November 9, marked or indicated A. Fib with RVR.  She was given a prescription for Eliquis via telephone. She was actually admitted to the hospital on November 12th or 13th for the PAF. She presented with a thin chest discomfort was thought to be musculoskeletal. 1 the major issues is that she's been having problems with her lap band. She had lots of nausea and vomiting. This is all around the time of her A. Fib. She had the band released which resolve a lot of her GI symptoms. After that she has not had recurrence of A. Fib.  She was discharged from the hospital on Eliquis and Plavix with aspirin stopped.it was then that she was on metoprolol 50 mg twice a day and Cardizem 120 mg --> Unfortunately she stopped taking Eliquis due to recent GI bleed.  Her beta blocker was also increased initially to  50 twice a day. We then decreased to 37-1/2 twice a day secondary to hypotension.  Carotid Dopplers were checked showing R Bulb 50-69% stenosis representing mild incremental change with recommendations to recheck in 12 months.  On December 12 she went to the emergency room after a hamburger and felt severe sharp epigastric and record her pain. It was at this time as he noted having some GI bleeding issues. She stopped taking the Eliquis prior to this for bloody diarrhea.  She has not been in touch with her GI MD - Dr. [name].    Since that ER visit, she actually has been doing relatively well. She's not had any more significant abdominal pain. She thinks that the whole he did issue revolved around her lap band issues. Now she said that release, she is felt much better and has had no further palpitations. She is currently on metoprolol 37.5mg  twice a day along with diltiazem 120 mg. She is now only taking Plavix. She denies any resting or exertional chest tightness or pressure. She has lost since July 10 pounds but she is actually already on 183 pounds prior to having the LAP-BAND release. Her weights go up and down based on the LAP-BAND adjustments. She's not had any further palpitations or rapid heartbeat since the November hospitalization. No syncope near syncope, except when she was having some GI bleeding issues in  the abdominal pains in earlier December. Since then no further symptoms. She denies any fevers or chills. No normal amount or hematochezia. No TIA or CVA symptoms or syncope/near syncope. No claudication.  Past Medical History  Diagnosis Date  . Glomerulonephritis, chronic, rapidly progressive 28  . ESRD (end stage renal disease) 1991    s/p Cadaveric Renal Transplant [facility] - Dr. Jimmy Footman)   . S/p cadaver renal transplant [place]  . 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 07/1993    Rescue PTCA of RCA -- referred for CABG.  . CAD in native  artery 07/1993     3 Vessel Disease (LAD-D1 & RCA) -- CABG  . 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 Servando Snare: LIMA-LAD, SVG-bifurcatingD1, SVG-rPDA  . CAD (coronary artery disease) of bypass graft 5/01; 3/'02, 8/'03, 10/'04; 1/15    PCI x 5 to SVG-D1   . CAD S/P percutaneous coronary angioplasty     PCI to SVG-D1 insertion/native D1 x 4 = '01 -(S660 BMS 2.5 x 9 - insertion into D1; '02 - distal overlap ACS Pixel 2.5 x 8  BMS; '03 distal/native ISR/Thrombosis - Pixel 2.5 x 13; '04 - ISR-  Taxus 2.5 x 20 (covered all);; 1/15 - mid SVG-D1 (50% distal ISR) - Promus P 2.75 x 20 -- 2.8 mm  . Unstable angina 5/01; 3/'02, 8/'03, 10/'04; 1/15    x 5 occurences since Inf-Post STEMI in 1994  . Diabetes mellitus type 2 in obese   . COPD mixed type     Followed by Dr. Lamonte Sakai  . OSA on CPAP   . Dyslipidemia, goal LDL below 70     08/2012: TC 137, TG 200, HDL 32!, LDL 45; on statin (followed by Dr.Deterding)  . Bilateral carotid artery stenosis 07/2009    Mild - moderate ~<0-49% internal carotid bilaterally; right bullb 50-69%  . Obesity     s/p Lap Band 04/2004- Port Replacement 10/09 & 2/10 for infection; Dr. Excell Seltzer.  [name] Kitchen MRSA (methicillin resistant staph aureus) culture positive   . Hypertension associated with diabetes   . Depression with anxiety   . Recurrent boils     Bilateral Groin  . Fibromyalgia   . Rheumatoid arthritis     Per Patient Report; associated with OA  . H/O: GI bleed   . PAF (paroxysmal atrial fibrillation)     Noted on CardioNet Monitor 11/5-18 - rate ~112  . PAD (peripheral artery disease) 08/2013    LEA Dopplers to be read by Dr. Fletcher Anon    Prior Cardiac Evaluation and Past Surgical History: Past Surgical History  Procedure Laterality Date  . Laparoscopic gastric banding  04/2004; 10/'09, 2/'10  . Coronary artery bypass graft  1995    LIMA-LAD, SVG-RPDA, SVG-D1  . Kidney transplant  1991  . Cardiac catheterization  5/'01, 3/'02, 8/'03, 10/'04; 1/'15    08/22/2013: LAD & RCA 100%;  LIMA-LAD & SVG-rPDA patent; Cx-- OM1 60%, OM2 ostial ~50%; SVG-D1 - 80% mid, 50% distal ISR --PCI  . Cardiac catheterization  5/'01, 3/'02, 8/'03, 10/'04; 1/'15    08/22/2013: LAD & RCA 100%; LIMA-LAD & SVG-rPDA patent; Cx-- OM1 60%, OM2 ostial ~50%; SVG-D1 - 80% mid, 50% distal ISR --PCI  . Percutaneous coronary stent intervention (pci-s)  5/'01, 3/'02, 8/'03, 10/'04;    '01 - S660 BMS 2.5 x 9 - dSVG-D1 into D1; '02- post-stent stenosis - 2.5 x 8 Pixel BMS; '8\03: ISR/Thrombosis into native D1 - AngioJet, 2.5  x 13 Pixel; '04 - ISR 95% - covered stented area with Taxus DES 2.5 mm x 20 (2.88)  . Percutaneous coronary stent intervention (pci-s)  08/22/2013    mid SVG-D1 80%; distal stent ~50% ISR; Promus Prermier DES 2.75 mm xc 20 mm (2.8 mm)  . Transthoracic echocardiogram  02/2013    NL LV Size, EF 55-60%; MAC with Mild MR.  . Lower extremity arterial dopplers  08/2013    ABI: R 0.96, L 1.04  . Left heart catheterization with coronary/graft angiogram N/A 08/23/2013    Procedure: LEFT HEART CATHETERIZATION WITH Beatrix Fetters;  Surgeon: Wellington Hampshire, MD;  Location: [facility] CATH LAB;  Service: Cardiovascular;  Laterality: N/A;  . Percutaneous coronary stent intervention (pci-s)  08/23/2013    Procedure: PERCUTANEOUS CORONARY STENT INTERVENTION (PCI-S);  Surgeon: Wellington Hampshire, MD;  Location: [facility] CATH LAB;  Service: Cardiovascular;;    ROS: A comprehensive was performed. Review of Systems  Constitutional: Positive for malaise/fatigue (But improving).  HENT: Negative for nosebleeds.   Respiratory: Negative for shortness of breath.   Gastrointestinal: Positive for nausea, abdominal pain and blood in stool (None since November). Negative for vomiting and melena.  Genitourinary: Negative for hematuria.  Neurological: Negative for dizziness, seizures and headaches.  Endo/Heme/Allergies: Does not bruise/bleed easily.  All other systems reviewed and are negative.   Current Outpatient  Prescriptions on File Prior to Visit  Medication Sig Dispense Refill  . amoxicillin-clavulanate (AUGMENTIN) 875-125 MG per tablet Take 1 tablet by mouth 2 (two) times daily. 10 tablet 0  . azaTHIOprine (IMURAN) 50 MG tablet Take 125 mg by mouth daily.    . calcitRIOL (ROCALTROL) 0.25 MCG capsule Take 0.25 mcg by mouth daily.    . clopidogrel (PLAVIX) 75 MG tablet Take 1 tablet (75 mg total) by mouth daily. 90 tablet 1  . cyclobenzaprine (FLEXERIL) 10 MG tablet Take 10 mg by mouth at bedtime.     [name] Kitchen diltiazem (CARDIZEM CD) 120 MG 24 hr capsule Take 1 capsule (120 mg total) by mouth daily. 30 capsule 6  . DULoxetine (CYMBALTA) 30 MG capsule Take 30 mg by mouth daily.      [name] Kitchen LORazepam (ATIVAN) 1 MG tablet Take 1 mg by mouth daily.     . montelukast (SINGULAIR) 10 MG tablet TAKE 1 TABLET ONCE A DAY 30 tablet 3  . nitroGLYCERIN (NITROSTAT) 0.4 MG SL tablet Place 1 tablet (0.4 mg total) under the tongue every 5 (five) minutes as needed for chest pain. 25 tablet 6  . omeprazole (PRILOSEC) 40 MG capsule Take 40 mg by mouth daily.      [name] Kitchen oxyCODONE-acetaminophen (PERCOCET) 7.5-325 MG per tablet Take 1 tablet by mouth every 6 (six) hours as needed for pain. 15 tablet 0  . predniSONE (DELTASONE) 5 MG tablet Take 5 mg by mouth daily.      . promethazine (PHENERGAN) 25 MG tablet Take 25 mg by mouth at bedtime. For nausea and sleep    . rosuvastatin (CRESTOR) 20 MG tablet Take 1 tablet (20 mg total) by mouth daily. 30 tablet 6  . sertraline (ZOLOFT) 100 MG tablet Take 200 mg by mouth daily.    . metoprolol tartrate (LOPRESSOR) 25 MG tablet Take 1.5 tablets (37.5 mg total) by mouth 2 (two) times daily. (Patient taking differently: Take 12.5 mg by mouth at bedtime. ) 90 tablet 6   No current facility-administered medications on file prior to visit.   Allergies  Allergen Reactions  . Codeine Nausea And Vomiting  .  Erythromycin Nausea And Vomiting  . Hydromorphone Hcl Nausea And Vomiting  . Nalbuphine Nausea  And Vomiting  . Niaspan [Niacin Er] Other (See Comments)    unknown  . Sulfonamide Derivatives Other (See Comments)    Reaction: per patient "tears her stomach up"  . Tetracycline Hives   Current Outpatient Prescriptions on File Prior to Visit  Medication Sig Dispense Refill  . amoxicillin-clavulanate (AUGMENTIN) 875-125 MG per tablet Take 1 tablet by mouth 2 (two) times daily. 10 tablet 0  . azaTHIOprine (IMURAN) 50 MG tablet Take 125 mg by mouth daily.    . calcitRIOL (ROCALTROL) 0.25 MCG capsule Take 0.25 mcg by mouth daily.    . clopidogrel (PLAVIX) 75 MG tablet Take 1 tablet (75 mg total) by mouth daily. 90 tablet 1  . cyclobenzaprine (FLEXERIL) 10 MG tablet Take 10 mg by mouth at bedtime.     [name] Kitchen diltiazem (CARDIZEM CD) 120 MG 24 hr capsule Take 1 capsule (120 mg total) by mouth daily. 30 capsule 6  . DULoxetine (CYMBALTA) 30 MG capsule Take 30 mg by mouth daily.      [name] Kitchen LORazepam (ATIVAN) 1 MG tablet Take 1 mg by mouth daily.     . montelukast (SINGULAIR) 10 MG tablet TAKE 1 TABLET ONCE A DAY 30 tablet 3  . nitroGLYCERIN (NITROSTAT) 0.4 MG SL tablet Place 1 tablet (0.4 mg total) under the tongue every 5 (five) minutes as needed for chest pain. 25 tablet 6  . omeprazole (PRILOSEC) 40 MG capsule Take 40 mg by mouth daily.      [name] Kitchen oxyCODONE-acetaminophen (PERCOCET) 7.5-325 MG per tablet Take 1 tablet by mouth every 6 (six) hours as needed for pain. 15 tablet 0  . predniSONE (DELTASONE) 5 MG tablet Take 5 mg by mouth daily.      . promethazine (PHENERGAN) 25 MG tablet Take 25 mg by mouth at bedtime. For nausea and sleep    . rosuvastatin (CRESTOR) 20 MG tablet Take 1 tablet (20 mg total) by mouth daily. 30 tablet 6  . sertraline (ZOLOFT) 100 MG tablet Take 200 mg by mouth daily.    . metoprolol tartrate (LOPRESSOR) 25 MG tablet Take 1.5 tablets (37.5 mg total) by mouth 2 (two) times daily. (Patient taking differently: Take 12.5 mg by mouth at bedtime. ) 90 tablet 6   No current  facility-administered medications on file prior to visit.  Not taking Eliquis   SOCIAL AND FAMILY HISTORY REVIEWED IN EPIC -- No change  Wt Readings from Last 3 Encounters:  08/01/14 192 lb 9.6 oz (87.363 kg)  07/28/14 185 lb (83.915 kg)  06/28/14 181 lb 14.1 oz (82.5 kg)    PHYSICAL EXAM BP 135/74 mmHg  Pulse 76  Ht 5\' 1"  (1.549 m)  Wt 192 lb 9.6 oz (87.363 kg)  BMI 36.41 kg/m2 General appearance: alert, cooperative, appears stated age, no distress and moderately obese Neck: no adenopathy, no carotid bruit, no JVD and supple, symmetrical, trachea midline Lungs: clear to auscultation bilaterally, normal percussion bilaterally and Nonlabored, good air movement Heart: RRR, normal S1 and S2.. She does have a somewhat lateralized PMI & sustained. soft S4 gallop but no S3. His also an 2/6 SEM radiating to the carotids. It is also soft HSM that goes toward the apex. Abdomen: Moderate obesity but otherwise soft, NT/ND./NA BS. No HSM. Extremities: extremities normal, atraumatic, no cyanosis or edema Pulses: ~1+ bilateral extremity pulses on exam. She does have mild faint femoral bruits Neurologic: Grossly normal   Adult  ECG Report  Rate: 69 ;  Rhythm: normal sinus rhythm and NSST  Narrative Interpretation: no significant change. Normal axis, intervals and durations. No ischemic changes.  Recent Labs:    No results found for: CHOL, HDL, LDLCALC, LDLDIRECT, TRIG, CHOLHDL   Chemistry      Component Value Date/Time   NA 137 07/28/2014 1957   K 3.8 07/28/2014 1957   CL 97 07/28/2014 1957   CO2 25 07/28/2014 1957   BUN 12 07/28/2014 1957   CREATININE 0.68 07/28/2014 1957   CREATININE 0.90 06/26/2014 1108      Component Value Date/Time   CALCIUM 9.8 07/28/2014 1957   ALKPHOS 183* 07/28/2014 1957   AST 78* 07/28/2014 1957   ALT 27 07/28/2014 1957   BILITOT 0.7 07/28/2014 1957     Lab Results  Component Value Date   WBC 9.9 07/28/2014   HGB 9.6* 07/28/2014   HCT 30.1* 07/28/2014    MCV 93.2 07/28/2014   PLT 228 07/28/2014    ASSESSMENT / PLAN: PAF (paroxysmal atrial fibrillation) She now has had a recurrence of A. Fib. She does have a relatively high CHA2DS2VASC score with PAD/CAD, HTN & female & justifiedly was recommended to start Eliquis.  Unfortunately,she had bloody stools & stopped taking it along with Plavix. For now, we will continue Plavix alone & refer her back to her GI MD (Dr. [name]) to evaluate the source of her GIB. If stable - would like to restart Eliquis.  We will discuss this at followup visit. For now we'll continue with current dose of metoprolol plus diltiazem for rate control. Unless she has significant recurrences, I would not consider rhythm control.  The longer she goes without recurrence may see more comfortable with considering Plavix alone.  Bright red rectal bleeding While on ELIQUIS and Plavix. She may have simple reason such as diverticuli or sublingual complex such as polyps. I would like her to be evaluated by GI medicine in order to determine the safety of restarting ELIQUIS. She has had a drop in her hemoglobin.  CAD S/P percutaneous coronary angioplasty - PCI x 5 to SVG-D1 Unfortunately with her multiple PCI's/stents, I really think she needs to be on antiplatelet agent. She is less than 1 year status post recent PCI and therefore I would be reluctant to stop Plavix.  At this point, as she is  Almost one year out from her last stent, single medication antiplatelet therapy for preventative maintenance he is what I'm considering, torn with the need for her to be on full anticoagulation as well.  Atherosclerosis of coronary artery bypass graft without angina pectoris She is on a beta blocker and statin. Would normally get the calcium channel blocker is acceptable.  PAD (peripheral artery disease) She was due to see Dr. Fletcher Anon to discuss results upper lower extremity and carotid Dopplers. She also has moderate carotid disease.   Nothing that at this point would warrant invasive evaluation. She does not have claudication.  Stenosis of right carotid artery without infarction Moderate disease by recent Doppler. Plan is to repeat in one year.  Dyslipidemia, goal LDL below 70 Reportedly, her nephrologist is following her cholesterol labs. She is on Crestor. I don't have any recent lab results.  Hypertension associated with diabetes Stable blood pressure. Also being monitored by her nephrologist. She is up to 37.5 twice a day of Lopressor and the 120 mg of diltiazem.  Morbid obesity - s/p Lap Band 9/'05 She generally has labile weight depending on the  tension on her lap band. Unfortunately, she would lose body weight with a lap band tightened, only to gain it backwhen it has to be loosened up for significant GI symptoms. The last episode GI symptoms led to a recurrence of her A. Fib.  This is being monitored and followed by her Gastroenterologist (Dr. [name] and surgeon Dr. Hunt Oris)    Orders Placed This Encounter  Procedures  . Ambulatory referral to Gastroenterology    Referral Priority:  Routine    Referral Type:  Consultation    Referral Reason:  Specialty Services Required    Referred to Provider:  Cleotis Nipper, MD    Requested Specialty:  Gastroenterology    Number of Visits Requested:  1  . EKG 12-Lead   No orders of the defined types were placed in this encounter.     Followup: 3 months   Nanami Whitelaw, Leonie Green, M.D., M.S. Interventional Cardiologist   Pager # [phone]

## 2014-08-03 NOTE — Assessment & Plan Note (Addendum)
She is on a beta blocker and statin. Would normally get the calcium channel blocker is acceptable.

## 2014-08-03 NOTE — Assessment & Plan Note (Signed)
While on ELIQUIS and Plavix. She may have simple reason such as diverticuli or sublingual complex such as polyps. I would like her to be evaluated by GI medicine in order to determine the safety of restarting ELIQUIS. She has had a drop in her hemoglobin.

## 2014-08-03 NOTE — Assessment & Plan Note (Signed)
Stable blood pressure. Also being monitored by her nephrologist. She is up to 37.5 twice a day of Lopressor and the 120 mg of diltiazem.

## 2014-08-03 NOTE — Assessment & Plan Note (Signed)
She generally has labile weight depending on the tension on her lap band. Unfortunately, she would lose body weight with a lap band tightened, only to gain it backwhen it has to be loosened up for significant GI symptoms. The last episode GI symptoms led to a recurrence of her A. Fib.  This is being monitored and followed by her Gastroenterologist (Dr. [name] and surgeon Dr. Hunt Oris)

## 2014-08-06 ENCOUNTER — Telehealth (INDEPENDENT_AMBULATORY_CARE_PROVIDER_SITE_OTHER): Payer: Self-pay

## 2014-08-06 NOTE — Telephone Encounter (Signed)
Patient calling into office requesting a prescription for Oxycodone.  Patient awaiting cardiac clearance to proceed with Laparoscopic Cholecystectomy w/IOC due to gallstones.  Patient aware that I will send Dr. Excell Seltzer a message and will notify patient.

## 2014-08-06 NOTE — Telephone Encounter (Signed)
If someone could please write her for oxycodone 5 mg, one by mouth every 4 hours when necessary #20. Oropharynx

## 2014-08-15 ENCOUNTER — Telehealth: Payer: Self-pay | Admitting: Cardiology

## 2014-08-15 ENCOUNTER — Telehealth (INDEPENDENT_AMBULATORY_CARE_PROVIDER_SITE_OTHER): Payer: Self-pay

## 2014-08-15 ENCOUNTER — Ambulatory Visit: Payer: Medicare Other | Admitting: Cardiology

## 2014-08-15 NOTE — Telephone Encounter (Signed)
Called and spoke to patient to make aware that I sent a pre-op cardiac clearance to Dr. Darcus Pester office but, have not rec'd a response.  Patient aware I will call their office in the morning regarding the clearance.

## 2014-08-15 NOTE — Telephone Encounter (Signed)
Have not been able to locate fax transmission. Will route to Dr. Ellyn Hack.

## 2014-08-15 NOTE — Telephone Encounter (Signed)
Pt called in stating that a surgical clearance sheet was faxed to last week our office from Dr. [name] Kitchen Hoxworth's office in regards to a galbladder surgery she needs to have. She would like for this form to be sent back as soon as possible so that this surgery can be scheduled. She also inquired about when she will need to stop taking her blood thinners. Please call  Thanks

## 2014-08-15 NOTE — Telephone Encounter (Signed)
-----   Message from Overton Mam sent at 08/14/2014 12:40 PM EST ----- Regarding: Patient wants you to call her Patient called to ask about her gallbladder surgery.  I told her I did not have orders.  She then asked to talk to you.  I told her I would be glad to send you a message.

## 2014-08-24 DIAGNOSIS — N182 Chronic kidney disease, stage 2 (mild): Secondary | ICD-10-CM | POA: Diagnosis not present

## 2014-08-24 DIAGNOSIS — I129 Hypertensive chronic kidney disease with stage 1 through stage 4 chronic kidney disease, or unspecified chronic kidney disease: Secondary | ICD-10-CM | POA: Diagnosis not present

## 2014-08-24 DIAGNOSIS — E785 Hyperlipidemia, unspecified: Secondary | ICD-10-CM | POA: Diagnosis not present

## 2014-08-24 DIAGNOSIS — N189 Chronic kidney disease, unspecified: Secondary | ICD-10-CM | POA: Diagnosis not present

## 2014-08-24 DIAGNOSIS — Z940 Kidney transplant status: Secondary | ICD-10-CM | POA: Diagnosis not present

## 2014-08-24 DIAGNOSIS — E118 Type 2 diabetes mellitus with unspecified complications: Secondary | ICD-10-CM | POA: Diagnosis not present

## 2014-08-28 ENCOUNTER — Telehealth: Payer: Self-pay | Admitting: Cardiology

## 2014-08-28 NOTE — Telephone Encounter (Signed)
Received records from [facility] (Dr

## 2014-08-28 NOTE — Telephone Encounter (Signed)
Received records from [facility] (Dr Jeneen Rinks Deterding) for appointment on 11/05/14 with [name].  Records given to [facility] (medical records) for Dr Darcus Pester schedule on 11/05/14. lp

## 2014-09-05 IMAGING — CR DG CHEST 2V
1 series · 1 of 1 positions shown · non-contrast
Comparison: 05/23/2013

CLINICAL DATA: Chest pain

EXAM:
CHEST  2 VIEW

[w chest lat]
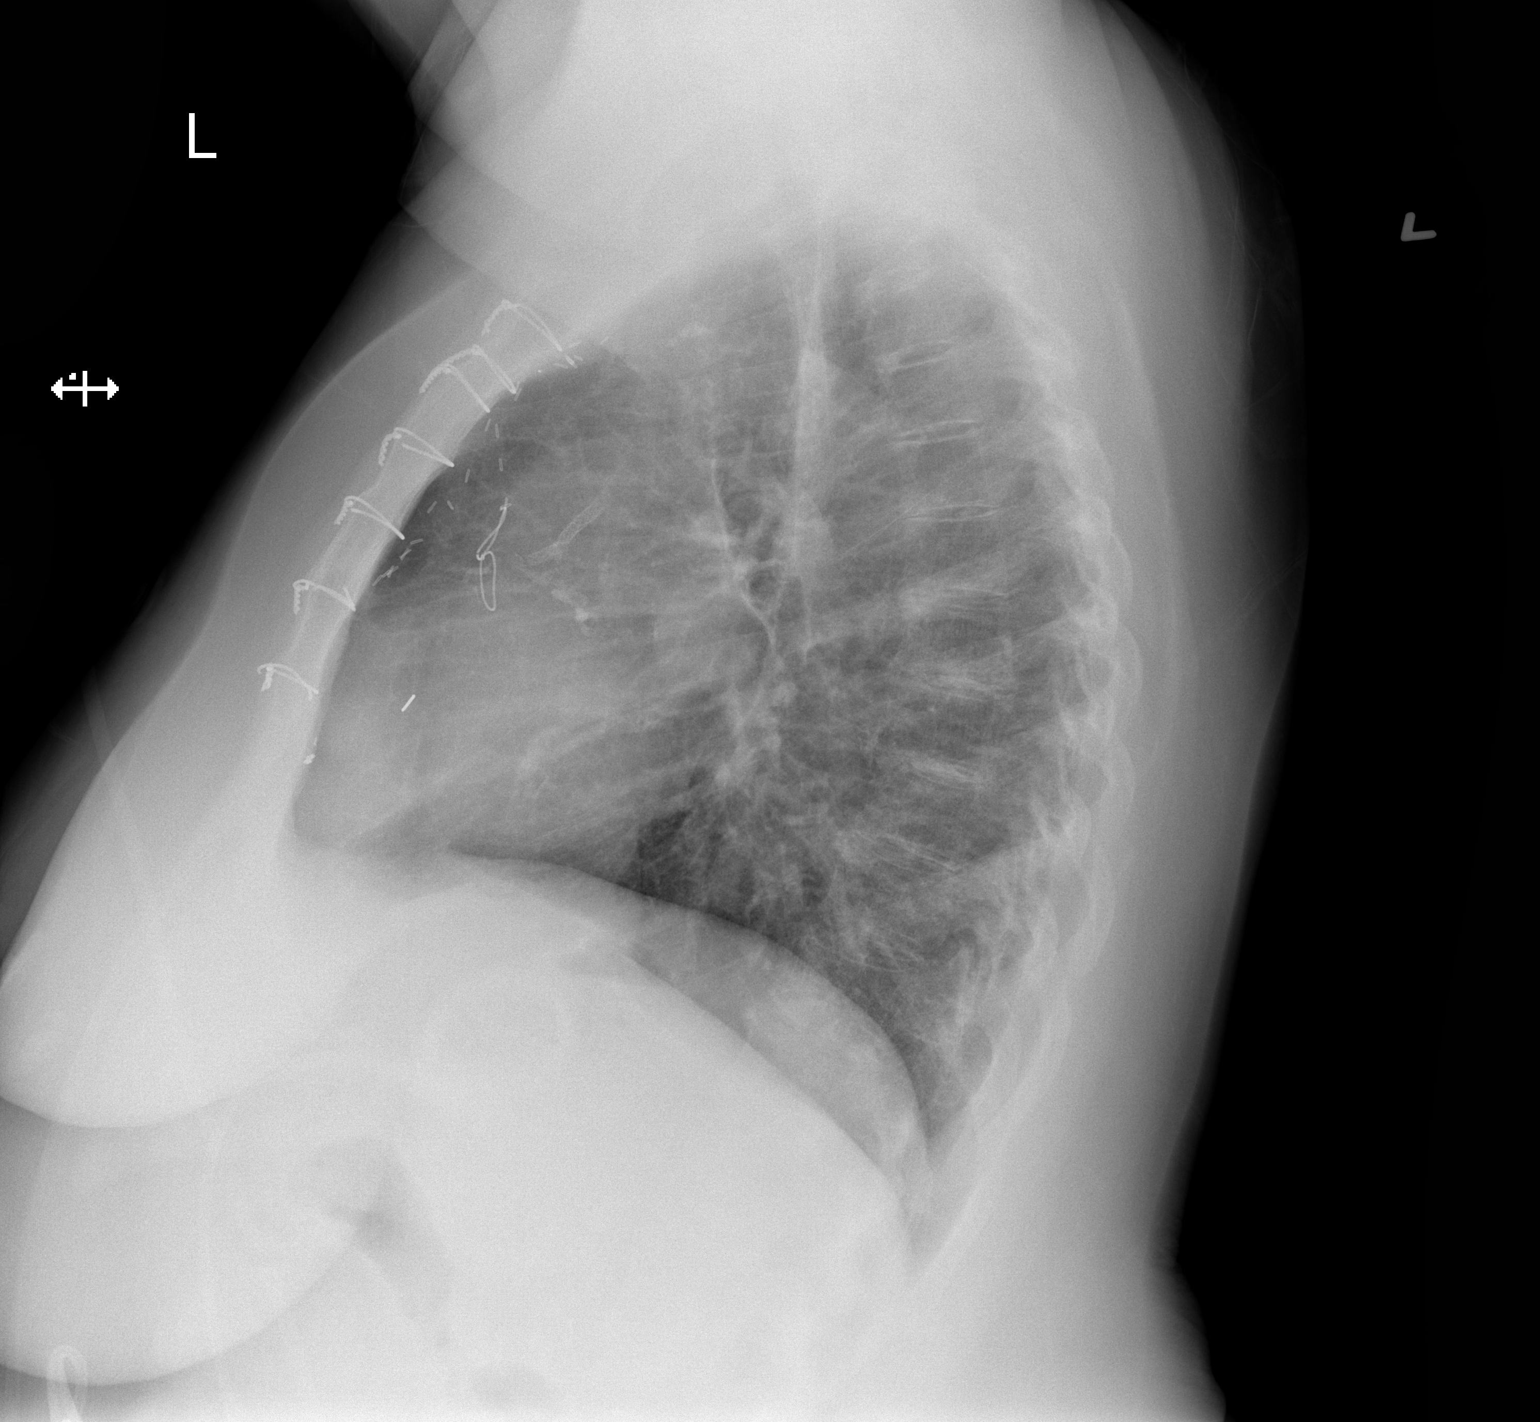

[1 of 1 positions shown; findings below may reference images not displayed]

FINDINGS: Status post CABG.  Normal heart size.  Coronary arterial stents.

Chronic interstitial coarsening without edema or consolidation. No
effusion or pneumothorax. Lap band has an unchanged, normal
obliquity.
IMPRESSION: No active cardiopulmonary disease.

## 2014-09-21 DIAGNOSIS — I129 Hypertensive chronic kidney disease with stage 1 through stage 4 chronic kidney disease, or unspecified chronic kidney disease: Secondary | ICD-10-CM | POA: Diagnosis not present

## 2014-09-21 DIAGNOSIS — N390 Urinary tract infection, site not specified: Secondary | ICD-10-CM | POA: Diagnosis not present

## 2014-10-03 ENCOUNTER — Encounter: Payer: Self-pay | Admitting: Cardiology

## 2014-10-17 ENCOUNTER — Other Ambulatory Visit: Payer: Self-pay | Admitting: Emergency Medicine

## 2014-10-19 NOTE — Patient Instructions (Addendum)
Katherine [name]  10/19/2014   Your procedure is scheduled on: 10/29/2014    Report to [facility] Main  Entrance and follow signs to               [place] at        [place].  Call this number if you have problems the morning of surgery [phone]   Remember:  Do not eat food or drink liquids :After Midnight.     Take these medicines the morning of surgery with A SIP OF WATER: diltiazem, duloxetine, flonase, prilosec, singulair, lorazepam, prednisone, crestor, sertraline                                You may not have any metal on your body including hair pins and              piercings  Do not wear jewelry, make-up, lotions, powders or perfumes., deodorant.               Do not wear nail polish.  Do not shave  48 hours prior to surgery.   Do not bring valuables to the hospital. [place].  Contacts, dentures or bridgework may not be worn into surgery.  Patients discharged the day of surgery will not be allowed to drive home.  Name and phone number of your driver:  Special Instructions: coughing and deep breathing exercises, leg exercises           _____________________________________________________________________             [facility] - Preparing for Surgery Before surgery, you can play an important role.  Because skin is not sterile, your skin needs to be as free of germs as possible.  You can reduce the number of germs on your skin by washing with CHG (chlorahexidine gluconate) soap before surgery.  CHG is an antiseptic cleaner which kills germs and bonds with the skin to continue killing germs even after washing. Please DO NOT use if you have an allergy to CHG or antibacterial soaps.  If your skin becomes reddened/irritated stop using the CHG and inform your nurse when you arrive at Short Stay. Do not shave (including legs and underarms) for at least 48 hours prior to the first CHG shower.   You may shave your face/neck. Please follow these instructions carefully:  1.  Shower with CHG Soap the night before surgery and the  morning of Surgery.  2.  If you choose to wash your hair, wash your hair first as usual with your  normal  shampoo.  3.  After you shampoo, rinse your hair and body thoroughly to remove the  shampoo.                            4.  Use CHG as you would any other liquid soap.  You can apply chg directly  to the skin and wash                       Gently with a scrungie or clean washcloth.  5.  Apply the CHG Soap to your body ONLY FROM THE NECK DOWN.  Do not use on face/ open                           Wound or open sores. Avoid contact with eyes, ears mouth and genitals (private parts).                       Wash face,  Genitals (private parts) with your normal soap.             6.  Wash thoroughly, paying special attention to the area where your surgery  will be performed.  7.  Thoroughly rinse your body with warm water from the neck down.  8.  DO NOT shower/wash with your normal soap after using and rinsing off  the CHG Soap.                9.  Pat yourself dry with a clean towel.            10.  Wear clean pajamas.            11.  Place clean sheets on your bed the night of your first shower and do not  sleep with pets. Day of Surgery : Do not apply any lotions/deodorants the morning of surgery.  Please wear clean clothes to the hospital/surgery center.  FAILURE TO FOLLOW THESE INSTRUCTIONS MAY RESULT IN THE CANCELLATION OF YOUR SURGERY PATIENT SIGNATURE_________________________________  NURSE SIGNATURE__________________________________  ________________________________________________________________________

## 2014-10-22 ENCOUNTER — Encounter (HOSPITAL_COMMUNITY)
Admission: RE | Admit: 2014-10-22 | Discharge: 2014-10-22 | Disposition: A | Discharge status: Home / Self Care | Payer: Medicare Other | Source: Ambulatory Visit | Attending: General Surgery | Admitting: General Surgery

## 2014-10-22 ENCOUNTER — Encounter (HOSPITAL_COMMUNITY): Payer: Self-pay

## 2014-10-22 ENCOUNTER — Ambulatory Visit (HOSPITAL_COMMUNITY)
Admission: RE | Admit: 2014-10-22 | Discharge: 2014-10-22 | Disposition: A | Discharge status: Home / Self Care | Payer: Medicare Other | Source: Ambulatory Visit | Attending: Anesthesiology | Admitting: Anesthesiology

## 2014-10-22 DIAGNOSIS — R0989 Other specified symptoms and signs involving the circulatory and respiratory systems: Secondary | ICD-10-CM | POA: Diagnosis not present

## 2014-10-22 DIAGNOSIS — Z01818 Encounter for other preprocedural examination: Secondary | ICD-10-CM | POA: Insufficient documentation

## 2014-10-22 DIAGNOSIS — R05 Cough: Secondary | ICD-10-CM | POA: Insufficient documentation

## 2014-10-22 DIAGNOSIS — J9811 Atelectasis: Secondary | ICD-10-CM | POA: Diagnosis not present

## 2014-10-22 DIAGNOSIS — R059 Cough, unspecified: Secondary | ICD-10-CM

## 2014-10-22 HISTORY — DX: Cerebral infarction, unspecified: I63.9

## 2014-10-22 HISTORY — DX: Gastro-esophageal reflux disease without esophagitis: K21.9

## 2014-10-22 LAB — BASIC METABOLIC PANEL
Anion gap: 8 (ref 5–15)
BUN: 20 mg/dL (ref 6–23)
CALCIUM: 9.3 mg/dL (ref 8.4–10.5)
CO2: 30 mmol/L (ref 19–32)
Chloride: 100 mmol/L (ref 96–112)
Creatinine, Ser: 0.92 mg/dL (ref 0.50–1.10)
GFR, EST AFRICAN AMERICAN: 79 mL/min — AB (ref >90)
GFR, EST NON AFRICAN AMERICAN: 68 mL/min — AB (ref >90)
Glucose, Bld: 173 mg/dL — ABNORMAL HIGH (ref 70–99)
Potassium: 4.3 mmol/L (ref 3.5–5.1)
SODIUM: 138 mmol/L (ref 135–145)

## 2014-10-22 LAB — SURGICAL PCR SCREEN
MRSA, PCR: NEGATIVE
Staphylococcus aureus: NEGATIVE

## 2014-10-22 LAB — CBC
HEMATOCRIT: 35.8 % — AB (ref 36.0–46.0)
HEMOGLOBIN: 11 g/dL — AB (ref 12.0–15.0)
MCH: 27.5 pg (ref 26.0–34.0)
MCHC: 30.7 g/dL (ref 30.0–36.0)
MCV: 89.5 fL (ref 78.0–100.0)
Platelets: 177 10*3/uL (ref 150–400)
RBC: 4 MIL/uL (ref 3.87–5.11)
RDW: 16.1 % — ABNORMAL HIGH (ref 11.5–15.5)
WBC: 6.6 10*3/uL (ref 4.0–10.5)

## 2014-10-22 NOTE — Progress Notes (Signed)
LOV note 08/24/14 Dr. Jimmy Footman on chart, EKG 08/01/14 on EPIC, ECHO 2014 on EPIC

## 2014-10-29 ENCOUNTER — Ambulatory Visit (HOSPITAL_COMMUNITY): Payer: Medicare Other | Admitting: Certified Registered Nurse Anesthetist

## 2014-10-29 ENCOUNTER — Ambulatory Visit (HOSPITAL_COMMUNITY)
Admission: RE | Admit: 2014-10-29 | Discharge: 2014-10-29 | Disposition: A | Discharge status: Home / Self Care | Payer: Medicare Other | Source: Ambulatory Visit | Attending: General Surgery | Admitting: General Surgery

## 2014-10-29 ENCOUNTER — Encounter (HOSPITAL_COMMUNITY): Payer: Self-pay | Admitting: *Deleted

## 2014-10-29 ENCOUNTER — Ambulatory Visit (HOSPITAL_COMMUNITY): Payer: Medicare Other

## 2014-10-29 ENCOUNTER — Encounter (HOSPITAL_COMMUNITY)
Admission: RE | Disposition: A | Discharge status: Home / Self Care | Payer: Self-pay | Source: Ambulatory Visit | Attending: General Surgery

## 2014-10-29 DIAGNOSIS — Z9049 Acquired absence of other specified parts of digestive tract: Secondary | ICD-10-CM | POA: Insufficient documentation

## 2014-10-29 DIAGNOSIS — K811 Chronic cholecystitis: Secondary | ICD-10-CM | POA: Diagnosis not present

## 2014-10-29 DIAGNOSIS — M797 Fibromyalgia: Secondary | ICD-10-CM | POA: Diagnosis not present

## 2014-10-29 DIAGNOSIS — M069 Rheumatoid arthritis, unspecified: Secondary | ICD-10-CM | POA: Insufficient documentation

## 2014-10-29 DIAGNOSIS — I2510 Atherosclerotic heart disease of native coronary artery without angina pectoris: Secondary | ICD-10-CM | POA: Insufficient documentation

## 2014-10-29 DIAGNOSIS — I252 Old myocardial infarction: Secondary | ICD-10-CM | POA: Insufficient documentation

## 2014-10-29 DIAGNOSIS — E119 Type 2 diabetes mellitus without complications: Secondary | ICD-10-CM | POA: Diagnosis not present

## 2014-10-29 DIAGNOSIS — G4733 Obstructive sleep apnea (adult) (pediatric): Secondary | ICD-10-CM | POA: Insufficient documentation

## 2014-10-29 DIAGNOSIS — K219 Gastro-esophageal reflux disease without esophagitis: Secondary | ICD-10-CM | POA: Diagnosis not present

## 2014-10-29 DIAGNOSIS — N018 Rapidly progressive nephritic syndrome with other morphologic changes: Secondary | ICD-10-CM | POA: Insufficient documentation

## 2014-10-29 DIAGNOSIS — Z6841 Body Mass Index (BMI) 40.0 and over, adult: Secondary | ICD-10-CM | POA: Diagnosis not present

## 2014-10-29 DIAGNOSIS — Z48815 Encounter for surgical aftercare following surgery on the digestive system: Secondary | ICD-10-CM | POA: Diagnosis not present

## 2014-10-29 DIAGNOSIS — Z419 Encounter for procedure for purposes other than remedying health state, unspecified: Secondary | ICD-10-CM

## 2014-10-29 DIAGNOSIS — I739 Peripheral vascular disease, unspecified: Secondary | ICD-10-CM | POA: Diagnosis not present

## 2014-10-29 DIAGNOSIS — K8010 Calculus of gallbladder with chronic cholecystitis without obstruction: Secondary | ICD-10-CM | POA: Insufficient documentation

## 2014-10-29 DIAGNOSIS — I480 Paroxysmal atrial fibrillation: Secondary | ICD-10-CM | POA: Insufficient documentation

## 2014-10-29 DIAGNOSIS — Z8673 Personal history of transient ischemic attack (TIA), and cerebral infarction without residual deficits: Secondary | ICD-10-CM | POA: Insufficient documentation

## 2014-10-29 DIAGNOSIS — I120 Hypertensive chronic kidney disease with stage 5 chronic kidney disease or end stage renal disease: Secondary | ICD-10-CM | POA: Diagnosis not present

## 2014-10-29 DIAGNOSIS — Z8719 Personal history of other diseases of the digestive system: Secondary | ICD-10-CM | POA: Insufficient documentation

## 2014-10-29 DIAGNOSIS — K8020 Calculus of gallbladder without cholecystitis without obstruction: Secondary | ICD-10-CM | POA: Diagnosis present

## 2014-10-29 DIAGNOSIS — Z9981 Dependence on supplemental oxygen: Secondary | ICD-10-CM | POA: Insufficient documentation

## 2014-10-29 DIAGNOSIS — Z940 Kidney transplant status: Secondary | ICD-10-CM | POA: Diagnosis not present

## 2014-10-29 DIAGNOSIS — N186 End stage renal disease: Secondary | ICD-10-CM | POA: Insufficient documentation

## 2014-10-29 DIAGNOSIS — Z87891 Personal history of nicotine dependence: Secondary | ICD-10-CM | POA: Diagnosis not present

## 2014-10-29 DIAGNOSIS — J449 Chronic obstructive pulmonary disease, unspecified: Secondary | ICD-10-CM | POA: Insufficient documentation

## 2014-10-29 DIAGNOSIS — Z951 Presence of aortocoronary bypass graft: Secondary | ICD-10-CM | POA: Insufficient documentation

## 2014-10-29 HISTORY — PX: CHOLECYSTECTOMY: SHX55

## 2014-10-29 LAB — GLUCOSE, CAPILLARY
Glucose-Capillary: 176 mg/dL — ABNORMAL HIGH (ref 70–99)
Glucose-Capillary: 217 mg/dL — ABNORMAL HIGH (ref 70–99)

## 2014-10-29 SURGERY — LAPAROSCOPIC CHOLECYSTECTOMY WITH INTRAOPERATIVE CHOLANGIOGRAM
Anesthesia: General | Site: Abdomen

## 2014-10-29 MED ORDER — PROMETHAZINE HCL 25 MG/ML IJ SOLN: 6.2500 mg | INTRAMUSCULAR | Status: DC | PRN

## 2014-10-29 MED ORDER — MIDAZOLAM HCL 2 MG/2ML IJ SOLN
INTRAMUSCULAR | Status: AC
  Filled 2014-10-29: qty 2

## 2014-10-29 MED ORDER — LACTATED RINGERS IR SOLN
Status: DC | PRN
  Administered 2014-10-29: 1000 mL

## 2014-10-29 MED ORDER — ONDANSETRON HCL 4 MG/2ML IJ SOLN
INTRAMUSCULAR | Status: DC | PRN
  Administered 2014-10-29: 4 mg via INTRAVENOUS

## 2014-10-29 MED ORDER — MIDAZOLAM HCL 5 MG/5ML IJ SOLN
INTRAMUSCULAR | Status: DC | PRN
  Administered 2014-10-29: 2 mg via INTRAVENOUS

## 2014-10-29 MED ORDER — OXYCODONE-ACETAMINOPHEN 7.5-325 MG PO TABS: 1.0000 | ORAL_TABLET | ORAL | Status: DC | PRN

## 2014-10-29 MED ORDER — SCOPOLAMINE 1 MG/3DAYS TD PT72
MEDICATED_PATCH | TRANSDERMAL | Status: AC
  Filled 2014-10-29: qty 1

## 2014-10-29 MED ORDER — CEFAZOLIN SODIUM-DEXTROSE 2-3 GM-% IV SOLR
INTRAVENOUS | Status: AC
  Filled 2014-10-29: qty 50

## 2014-10-29 MED ORDER — NEOSTIGMINE METHYLSULFATE 10 MG/10ML IV SOLN
INTRAVENOUS | Status: DC | PRN
  Administered 2014-10-29: 4 mg via INTRAVENOUS

## 2014-10-29 MED ORDER — LACTATED RINGERS IV SOLN: INTRAVENOUS | Status: DC

## 2014-10-29 MED ORDER — LIDOCAINE HCL (CARDIAC) 20 MG/ML IV SOLN
INTRAVENOUS | Status: DC | PRN
  Administered 2014-10-29: 50 mg via INTRAVENOUS

## 2014-10-29 MED ORDER — ONDANSETRON HCL 4 MG/2ML IJ SOLN
INTRAMUSCULAR | Status: AC
  Filled 2014-10-29: qty 2

## 2014-10-29 MED ORDER — ROCURONIUM BROMIDE 100 MG/10ML IV SOLN
INTRAVENOUS | Status: AC
  Filled 2014-10-29: qty 1

## 2014-10-29 MED ORDER — BUPIVACAINE-EPINEPHRINE (PF) 0.25% -1:200000 IJ SOLN
INTRAMUSCULAR | Status: AC
  Filled 2014-10-29: qty 30

## 2014-10-29 MED ORDER — FENTANYL CITRATE 0.05 MG/ML IJ SOLN
25.0000 ug | INTRAMUSCULAR | Status: DC | PRN
Start: 2014-10-29 — End: 2014-10-29
  Administered 2014-10-29: 50 ug via INTRAVENOUS
  Administered 2014-10-29 (×2): 25 ug via INTRAVENOUS

## 2014-10-29 MED ORDER — SCOPOLAMINE 1 MG/3DAYS TD PT72
MEDICATED_PATCH | TRANSDERMAL | Status: DC | PRN
  Administered 2014-10-29: 1 via TRANSDERMAL

## 2014-10-29 MED ORDER — FENTANYL CITRATE 0.05 MG/ML IJ SOLN
INTRAMUSCULAR | Status: AC
  Filled 2014-10-29: qty 2

## 2014-10-29 MED ORDER — CEFAZOLIN SODIUM-DEXTROSE 2-3 GM-% IV SOLR
2.0000 g | INTRAVENOUS | Status: AC
  Administered 2014-10-29: 2 g via INTRAVENOUS

## 2014-10-29 MED ORDER — OXYCODONE-ACETAMINOPHEN 5-325 MG PO TABS
1.0000 | ORAL_TABLET | ORAL | Status: DC | PRN
  Administered 2014-10-29: 1 via ORAL
  Filled 2014-10-29: qty 1

## 2014-10-29 MED ORDER — PROPOFOL 10 MG/ML IV BOLUS
INTRAVENOUS | Status: AC
  Filled 2014-10-29: qty 20

## 2014-10-29 MED ORDER — LACTATED RINGERS IV SOLN
INTRAVENOUS | Status: DC | PRN
  Administered 2014-10-29 (×2): via INTRAVENOUS

## 2014-10-29 MED ORDER — GLYCOPYRROLATE 0.2 MG/ML IJ SOLN
INTRAMUSCULAR | Status: DC | PRN
  Administered 2014-10-29: 0.6 mg via INTRAVENOUS

## 2014-10-29 MED ORDER — SUCCINYLCHOLINE CHLORIDE 20 MG/ML IJ SOLN
INTRAMUSCULAR | Status: DC | PRN
  Administered 2014-10-29: 100 mg via INTRAVENOUS

## 2014-10-29 MED ORDER — FENTANYL CITRATE 0.05 MG/ML IJ SOLN
INTRAMUSCULAR | Status: DC | PRN
  Administered 2014-10-29 (×2): 50 ug via INTRAVENOUS
  Administered 2014-10-29: 100 ug via INTRAVENOUS
  Administered 2014-10-29: 50 ug via INTRAVENOUS

## 2014-10-29 MED ORDER — LIDOCAINE HCL (CARDIAC) 20 MG/ML IV SOLN
INTRAVENOUS | Status: AC
  Filled 2014-10-29: qty 5

## 2014-10-29 MED ORDER — 0.9 % SODIUM CHLORIDE (POUR BTL) OPTIME
TOPICAL | Status: DC | PRN
  Administered 2014-10-29: 1000 mL

## 2014-10-29 MED ORDER — PROPOFOL 10 MG/ML IV BOLUS
INTRAVENOUS | Status: DC | PRN
  Administered 2014-10-29: 180 mg via INTRAVENOUS

## 2014-10-29 MED ORDER — DEXAMETHASONE SODIUM PHOSPHATE 10 MG/ML IJ SOLN
INTRAMUSCULAR | Status: DC | PRN
  Administered 2014-10-29: 10 mg via INTRAVENOUS

## 2014-10-29 MED ORDER — ROCURONIUM BROMIDE 100 MG/10ML IV SOLN
INTRAVENOUS | Status: DC | PRN
  Administered 2014-10-29: 10 mg via INTRAVENOUS
  Administered 2014-10-29: 5 mg via INTRAVENOUS
  Administered 2014-10-29: 10 mg via INTRAVENOUS
  Administered 2014-10-29: 25 mg via INTRAVENOUS

## 2014-10-29 MED ORDER — BUPIVACAINE-EPINEPHRINE 0.25% -1:200000 IJ SOLN
INTRAMUSCULAR | Status: DC | PRN
  Administered 2014-10-29: 15 mL

## 2014-10-29 MED ORDER — DEXAMETHASONE SODIUM PHOSPHATE 10 MG/ML IJ SOLN
INTRAMUSCULAR | Status: AC
  Filled 2014-10-29: qty 1

## 2014-10-29 MED ORDER — IOHEXOL 300 MG/ML  SOLN
INTRAMUSCULAR | Status: DC | PRN
  Administered 2014-10-29: 5 mL

## 2014-10-29 MED ORDER — FENTANYL CITRATE 0.05 MG/ML IJ SOLN
INTRAMUSCULAR | Status: AC
  Filled 2014-10-29: qty 5

## 2014-10-29 SURGICAL SUPPLY — 29 items
APPLIER CLIP ROT 10 11.4 M/L (STAPLE) ×3
APR CLP MED LRG 11.4X10 (STAPLE) ×1
BAG SPEC RTRVL LRG 6X4 10 (ENDOMECHANICALS) ×1
CATH REDDICK CHOLANGI 4FR 50CM (CATHETERS) IMPLANT
CHLORAPREP W/TINT 26ML (MISCELLANEOUS) ×3 IMPLANT
CLIP APPLIE ROT 10 11.4 M/L (STAPLE) ×1 IMPLANT
COVER MAYO STAND STRL (DRAPES) ×3 IMPLANT
DECANTER SPIKE VIAL GLASS SM (MISCELLANEOUS) ×3 IMPLANT
DRAPE C-ARM 42X120 X-RAY (DRAPES) ×3 IMPLANT
DRAPE LAPAROSCOPIC ABDOMINAL (DRAPES) ×3 IMPLANT
ELECT REM PT RETURN 9FT ADLT (ELECTROSURGICAL) ×3
ELECTRODE REM PT RTRN 9FT ADLT (ELECTROSURGICAL) ×1 IMPLANT
GLOVE BIOGEL PI IND STRL 7.5 (GLOVE) ×1 IMPLANT
GLOVE BIOGEL PI INDICATOR 7.5 (GLOVE) ×8
GLOVE ECLIPSE 7.5 STRL STRAW (GLOVE) ×3 IMPLANT
GOWN STRL REUS W/TWL XL LVL3 (GOWN DISPOSABLE) ×12 IMPLANT
HEMOSTAT SNOW SURGICEL 2X4 (HEMOSTASIS) IMPLANT
KIT BASIN OR (CUSTOM PROCEDURE TRAY) ×3 IMPLANT
LIQUID BAND (GAUZE/BANDAGES/DRESSINGS) ×3 IMPLANT
POUCH SPECIMEN RETRIEVAL 10MM (ENDOMECHANICALS) ×2 IMPLANT
SCISSORS LAP 5X35 DISP (ENDOMECHANICALS) ×3 IMPLANT
SET CHOLANGIOGRAPH MIX (MISCELLANEOUS) ×3 IMPLANT
SET IRRIG TUBING LAPAROSCOPIC (IRRIGATION / IRRIGATOR) ×3 IMPLANT
SLEEVE XCEL OPT CAN 5 100 (ENDOMECHANICALS) ×3 IMPLANT
SUT MNCRL AB 4-0 PS2 18 (SUTURE) ×3 IMPLANT
TRAY LAPAROSCOPIC (CUSTOM PROCEDURE TRAY) ×3 IMPLANT
TROCAR BLADELESS OPT 5 100 (ENDOMECHANICALS) ×3 IMPLANT
TROCAR XCEL BLUNT TIP 100MML (ENDOMECHANICALS) ×3 IMPLANT
TROCAR XCEL NON-BLD 11X100MML (ENDOMECHANICALS) ×3 IMPLANT

## 2014-10-29 NOTE — Transfer of Care (Signed)
Immediate Anesthesia Transfer of Care Note  Patient: Katherine [name]  Procedure(s) Performed: Procedure(s): LAPAROSCOPIC CHOLECYSTECTOMY WITH INTRAOPERATIVE CHOLANGIOGRAM (N/A)  Patient Location: PACU  Anesthesia Type:General  Level of Consciousness: awake, alert  and oriented  Airway & Oxygen Therapy: Patient Spontanous Breathing and Patient connected to face mask oxygen  Post-op Assessment: Report given to RN and Post -op Vital signs reviewed and stable  Post vital signs: Reviewed and stable  Last Vitals:  Filed Vitals:   10/29/14 0708  BP: 160/98  Pulse: 69  Temp: 36.7 C  Resp: 20    Complications: No apparent anesthesia complications

## 2014-10-29 NOTE — Anesthesia Preprocedure Evaluation (Addendum)
Anesthesia Evaluation  Patient identified by MRN, date of birth, ID band Patient awake    Reviewed: Allergy & Precautions, Patient's Chart, lab work & pertinent test results  Airway Mallampati: I  TM Distance: >3 FB Neck ROM: Full    Dental  (+) Edentulous Upper, Edentulous Lower   Pulmonary sleep apnea , COPD oxygen dependent, former smoker,  O2 at night breath sounds clear to auscultation        Cardiovascular hypertension, + CAD, + Past MI, + Cardiac Stents, + CABG and + Peripheral Vascular Disease Rhythm:Regular Rate:Normal  Pt off plavix per Cardiologists instructions.   Neuro/Psych Anxiety Depression  Neuromuscular disease CVA    GI/Hepatic Neg liver ROS, GERD-  ,  Endo/Other  diabetesMorbid obesity  Renal/GU S/p renal transplant. Cr WNL.     Musculoskeletal  (+) Arthritis -,   Abdominal   Peds  Hematology negative hematology ROS (+)   Anesthesia Other Findings   Reproductive/Obstetrics                           Anesthesia Physical Anesthesia Plan  ASA: III  Anesthesia Plan: General   Post-op Pain Management:    Induction: Intravenous  Airway Management Planned: Oral ETT  Additional Equipment:   Intra-op Plan:   Post-operative Plan: Extubation in OR  Informed Consent: I have reviewed the patients History and Physical, chart, labs and discussed the procedure including the risks, benefits and alternatives for the proposed anesthesia with the patient or authorized representative who has indicated his/her understanding and acceptance.   Dental advisory given  Plan Discussed with: CRNA  Anesthesia Plan Comments:         Anesthesia Quick Evaluation

## 2014-10-29 NOTE — Anesthesia Procedure Notes (Signed)
Procedure Name: Intubation Performed by: Noralyn Pick D Pre-anesthesia Checklist: Patient identified, Emergency Drugs available, Suction available and Patient being monitored Patient Re-evaluated:Patient Re-evaluated prior to inductionOxygen Delivery Method: Circle System Utilized Preoxygenation: Pre-oxygenation with 100% oxygen Intubation Type: IV induction Ventilation: Mask ventilation without difficulty Laryngoscope Size: Mac and 4 Grade View: Grade II Tube type: Oral Tube size: 7.5 mm Number of attempts: 1 Airway Equipment and Method: Stylet and Oral airway Placement Confirmation: ETT inserted through vocal cords under direct vision,  positive ETCO2 and breath sounds checked- equal and bilateral Secured at: 22 cm Tube secured with: Tape Dental Injury: Teeth and Oropharynx as per pre-operative assessment

## 2014-10-29 NOTE — H&P (Signed)
The patient is a 59 year old female who presents for evaluation of gall stones. She returns to the office well known to me from lap band surgery. Last week she developed the sudden onset of severe pressure-like epigastric and right upper quadrant pain radiating around to her back. This was after eating a small fatty meal. She had no nausea or vomiting. The pain was persistent and severe and she presented to the emergency room. She also at that time was having some intermittent rectal bleeding. She was recently placed on anticoagulation for atrial arrhythmia. On the advice of her cardiologist she stopped the anticoagulation and she has had no further bleeding. Colonoscopy has been recommended and she will be seeing GI. She was evaluated in the emergency room for abdominal pain with a CT scan. Really the only abnormal finding was cholelithiasis. Since then the patient has continued to have some intermittent milder pain in the right upper quadrant often related to eating. No fever or chills or jaundice. She has no previous history of any similar pain. She does not have any typical symptoms of lap band over restriction and CT showed it to be in good position.   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) ATHEROSCLEROSIS OF CORONARY ARTERY BYPASS GRAFT(S) WITHOUT ANGINA PECTORIS (I25.810) CORONARY ANGIOPLASTY STATUS (Z98.61) UNSTABLE ANGINA (I20.0) TYPE 2 DIABETES MELLITUS WITHOUT COMPLICATIONS (XX123456) OBESITY, UNSPECIFIED (E66.9) CHRONIC OBSTRUCTIVE PULMONARY DISEASE, UNSPECIFIED (J44.9) OBSTRUCTIVE SLEEP APNEA (ADULT) (PEDIATRIC) (G47.33) HYPERLIPIDEMIA, UNSPECIFIED (E78.5) 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) CARRIER OR SUSPECTED CARRIER OF METHICILLIN RESISTANT STAPHYLOCOCCUS AUREUS (Z22.322) ESSENTIAL (PRIMARY) HYPERTENSION (I10) OTHER SPECIFIED ANXIETY DISORDERS (F41.8) FIBROMYALGIA (M79.7) RHEUMATOID ARTHRITIS, UNSPECIFIED (M06.9) PERSONAL HISTORY OF OTHER DISEASES OF THE DIGESTIVE SYSTEM (Z87.19) 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 HYDROmorphone HCl *ANALGESICS - OPIOID* Nalbuphine HCl *ANALGESICS - OPIOID* Triple Sulfonamides *SULFONAMIDES* Tetracycline HCl *Tetracyclines**  Medication History Katherine [name], CMA; 08/03/2014 3:11 PM) Eliquis (5MG  Tablet, Oral) Active. AzaTHIOprine (50MG  Tablet, Oral) Active. Calcitriol (0.25MCG Capsule, Oral) Active. Clopidogrel Bisulfate (75MG  Tablet, Oral) Active. Cyclobenzaprine HCl (10MG  Tablet, Oral) Active. Diltiazem HCl ER Coated Beads (120MG  Capsule ER 24HR, Oral) Active. DULoxetine HCl (30MG  Capsule DR Part, Oral) Active. Fluticasone Propionate (50MCG/ACT Suspension, Nasal) Active. LORazepam (1MG  Tablet, Oral) Active. Metoprolol Tartrate (25MG  Tablet, Oral) Active. Metoprolol Tartrate (50MG  Tablet, Oral) Active. Montelukast Sodium (10MG  Tablet, Oral) Active. Promethazine HCl (25MG  Tablet, Oral) Active. Crestor (20MG  Tablet, Oral) Active. PriLOSEC (40MG  Capsule DR, Oral) Active. PredniSONE (5MG  Tablet, Oral) Active. Sertraline HCl (100MG   Tablet, Oral) Active. OneTouch Ultra Blue (In Vitro) Active. Nitrostat (0.4MG  Tab Sublingual, Sublingual) Active.  Vitals [employer] R. Brooks CMA; 08/03/2014 3:10 PM) 08/03/2014 3:09 PM Weight: 193.25 lb Height: 61in Body Surface Area: 1.94 m Body Mass Index: 36.51 kg/m BP: 120/74 (Sitting, Left Arm, Standard)    Physical Exam [name] Kitchen T. Tavis Kring MD; 08/03/2014 6:00 PM) The physical exam findings are as follows: Note:General: Alert, moderately  obese Caucasian female, in no distress Skin: Warm and dry without rash or infection. HEENT: No palpable masses or thyromegaly. Sclera nonicteric. Pupils equal round and reactive. Oropharynx clear. Lymph nodes: No cervical, supraclavicular, or inguinal nodes palpable. Lungs: Breath sounds clear and equal. No wheezing or increased work of breathing. Cardiovascular: Regular rate and rhythm without murmer. No JVD or edema. Peripheral pulses intact. No carotid bruits. Abdomen: Nondistended. There is mild tenderness in the right upper quadrant.. No masses palpable. No organomegaly. No palpable hernias. Extremities: No edema or joint swelling or deformity. No chronic venous stasis changes. Neurologic: Alert and fully oriented. Gait normal. No focal weakness. Psychiatric: Normal mood and affect. Thought content appropriate with normal judgement and insight    Assessment & Plan [name] Kitchen T. Kaylani Fromme MD; 08/03/2014 6:01 PM) CHOLELITHIASIS WITH CHOLECYSTITIS (574.10  K80.10) Impression: Her recent episode of severe abdominal pain was very typical for biliary colic and CT scan has confirmed cholelithiasis. She continues to be mildly symptomatic. I recommended proceeding with laparoscopic cholecystectomy with cholangiogram. She is planning to undergo colonoscopy to evaluate rectal bleeding. Her anticoagulation has been temporarily stopped due to bleeding which would be an ideal time to proceed with cholecystectomy that I think could be done  before or after her colonoscopy as she is significantly symptomatic. We will plan to obtain cardiac clearance but I don't see that there would be undue risk. I discussed the procedure in detail. The patient was given Neurosurgeon. We discussed the risks and benefits of a laparoscopic cholecystectomy and possible cholangiogram including, but not limited to, bleeding, infection, injury to surrounding structures such as the intestine or liver, bile leak, retained gallstones, need to convert to an open procedure, prolonged diarrhea, blood clots such as DVT, common bile duct injury, anesthesia risks, and possible need for additional procedures. The likelihood of improvement in symptoms and return to the patient's normal status is good. We discussed the typical post-operative recovery course. All questions were answered. We will contact her regarding scheduling. Current Plans  Schedule for Surgery Laparoscopic cholecystectomy with intraoperative cholangiogram

## 2014-10-29 NOTE — Discharge Instructions (Signed)
[facility] ______[facility] °LAPAROSCOPIC SURGERY: POST OP INSTRUCTIONS °Always review your discharge instruction sheet given to you by the facility where your surgery was performed. °IF YOU HAVE DISABILITY OR FAMILY LEAVE FORMS, YOU MUST BRING THEM TO THE OFFICE FOR PROCESSING.   °DO NOT GIVE THEM TO YOUR DOCTOR. ° °1. A prescription for pain medication may be given to you upon discharge.  Take your pain medication as prescribed, if needed.  If narcotic pain medicine is not needed, then you may take acetaminophen (Tylenol) or ibuprofen (Advil) as needed. °2. Take your usually prescribed medications unless otherwise directed. °3. If you need a refill on your pain medication, please contact your pharmacy.  They will contact our office to request authorization. Prescriptions will not be filled after 5pm or on week-ends. °4. You should follow a light diet the first few days after arrival home, such as soup and crackers, etc.  Be sure to include lots of fluids daily. °5. Most patients will experience some swelling and bruising in the area of the incisions.  Ice packs will help.  Swelling and bruising can take several days to resolve.  °6. It is common to experience some constipation if taking pain medication after surgery.  Increasing fluid intake and taking a stool softener (such as Colace) will usually help or prevent this problem from occurring.  A mild laxative (Milk of Magnesia or Miralax) should be taken according to package instructions if there are no bowel movements after 48 hours. °7. Unless discharge instructions indicate otherwise, you may remove your bandages 24-48 hours after surgery, and you may shower at that time.  You may have steri-strips (small skin tapes) in place directly over the incision.  These strips should be left on the skin for 7-10 days.  If your surgeon used skin glue on the incision, you may shower in 24 hours.  The glue will flake off over the next 2-3 weeks.  Any sutures or  staples will be removed at the office during your follow-up visit. °8. ACTIVITIES:  You may resume regular (light) daily activities beginning the next day--such as daily self-care, walking, climbing stairs--gradually increasing activities as tolerated.  You may have sexual intercourse when it is comfortable.  Refrain from any heavy lifting or straining until approved by your doctor. °a. You may drive when you are no longer taking prescription pain medication, you can comfortably wear a seatbelt, and you can safely maneuver your car and apply brakes. °b. RETURN TO WORK:  __________________________________________________________ °9. You should see your doctor in the office for a follow-up appointment approximately 2-3 weeks after your surgery.  Make sure that you call for this appointment within a day or two after you arrive home to insure a convenient appointment time. °10. OTHER INSTRUCTIONS: __________________________________________________________________________________________________________________________ __________________________________________________________________________________________________________________________ °WHEN TO CALL YOUR DOCTOR: °1. Fever over 101.0 °2. Inability to urinate °3. Continued bleeding from incision. °4. Increased pain, redness, or drainage from the incision. °5. Increasing abdominal pain ° °The clinic staff is available to answer your questions during regular business hours.  Please don’t hesitate to call and ask to speak to one of the nurses for clinical concerns.  If you have a medical emergency, go to the nearest emergency room or call 911.  A surgeon from [facility] is always on call at the hospital. °[address], [address], [place], [place]  [address] ? [address] [address], [place],    [address] °[phone] ? [phone] ? FAX [phone] °Web site:   [email or web address] °

## 2014-10-29 NOTE — Op Note (Signed)
Preoperative diagnosis: Cholelithiasis and chronic cholecystitis  Postoperative diagnosis: Cholelithiasis and chronic cholecystitis  Surgical procedure: Laparoscopic cholecystectomy with intraoperative cholangiogram  Surgeon: [name] Kitchen T. Ronav Furney M.D.  Assistant: None  Anesthesia: General Endotracheal  Complications: None  Estimated blood loss: Minimal  Description of procedure: The patient brought to the operating room, placed in the supine position on the operating table, and general endotracheal anesthesia induced. The abdomen was widely sterilely prepped and draped. The patient had received preoperative IV antibiotics and PAS were in place. Patient timeout was performed the correct procedure verified. Standard 4 port technique was used with an open Hassan cannula at the umbilicus and the remainder of the ports placed under direct vision. The gallbladder was visualized. It appeared moderately chronically inflammed. The fundus was grasped and elevated up over the liver and the infundibulum retracted inferiolaterally. Peritoneum anterior and posterior to close triangle was incised and fibrofatty tissue stripped off the neck of the gallbladder toward the porta hepatis. The distal gallbladder was thoroughly dissected. The cystic artery was identified in close triangle and the cystic duct gallbladder junction dissected 360.  A good critical view was obtained. When the anatomy was clear the cystic duct was clipped at the gallbladder junction and an operative cholangiogram obtained through the cystic duct. This showed good filling of a normal common bile duct and partial filling of the intrahepatic ducts with free flow into the duodenum and no filling defects. Following this the Cholangiocath was removed and the cystic duct was doubly clipped proximally and divided. The cystic artery was doubly clipped proximally and distally and divided. The gallbladder was dissected free from its bed using hook  cautery and removed through the umbilical port site. Complete hemostasis was obtained in the gallbladder bed. The right upper quadrant was thoroughly irrigated and hemostasis assured. Trochars were removed and all CO2 evacuated and the [facility] trocar site fascial defect closed. Skin incisions were closed with subcuticular Monocryl and Dermabond. Sponge needle and instrument counts were correct. The patient was taken to PACU in good condition.  [name] T  10/29/2014

## 2014-10-29 NOTE — Interval H&P Note (Signed)
History and Physical Interval Note:  10/29/2014 9:13 AM  Katherine [name]  has presented today for surgery, with the diagnosis of gallstones  The various methods of treatment have been discussed with the patient and family. After consideration of risks, benefits and other options for treatment, the patient has consented to  Procedure(s): LAPAROSCOPIC CHOLECYSTECTOMY WITH INTRAOPERATIVE CHOLANGIOGRAM (N/A) as a surgical intervention .  The patient's history has been reviewed, patient examined, no change in status, stable for surgery.  I have reviewed the patient's chart and labs.  Questions were answered to the patient's satisfaction.     Japhet Morgenthaler T

## 2014-10-29 NOTE — Anesthesia Postprocedure Evaluation (Signed)
  Anesthesia Post-op Note  Patient: Katherine [name]  Procedure(s) Performed: Procedure(s): LAPAROSCOPIC CHOLECYSTECTOMY WITH INTRAOPERATIVE CHOLANGIOGRAM (N/A)  Patient Location: PACU  Anesthesia Type:General  Level of Consciousness: awake and alert   Airway and Oxygen Therapy: Patient Spontanous Breathing  Post-op Pain: none  Post-op Assessment: Post-op Vital signs reviewed  Post-op Vital Signs: Reviewed  Last Vitals:  Filed Vitals:   10/29/14 1245  BP: 101/56  Pulse: 57  Temp: 36.6 C  Resp: 12    Complications: No apparent anesthesia complications

## 2014-10-30 ENCOUNTER — Telehealth: Payer: Self-pay | Admitting: Emergency Medicine

## 2014-10-30 ENCOUNTER — Encounter (HOSPITAL_COMMUNITY): Payer: Self-pay | Admitting: General Surgery

## 2014-10-30 IMAGING — CR DG CHEST 2V
2 series · 2 of 2 positions shown · non-contrast
Comparison: DG CHEST 2 VIEW dated 08/22/2013

CLINICAL DATA: Persistent cough and nausea.

EXAM:
CHEST  2 VIEW

[w chest pa]
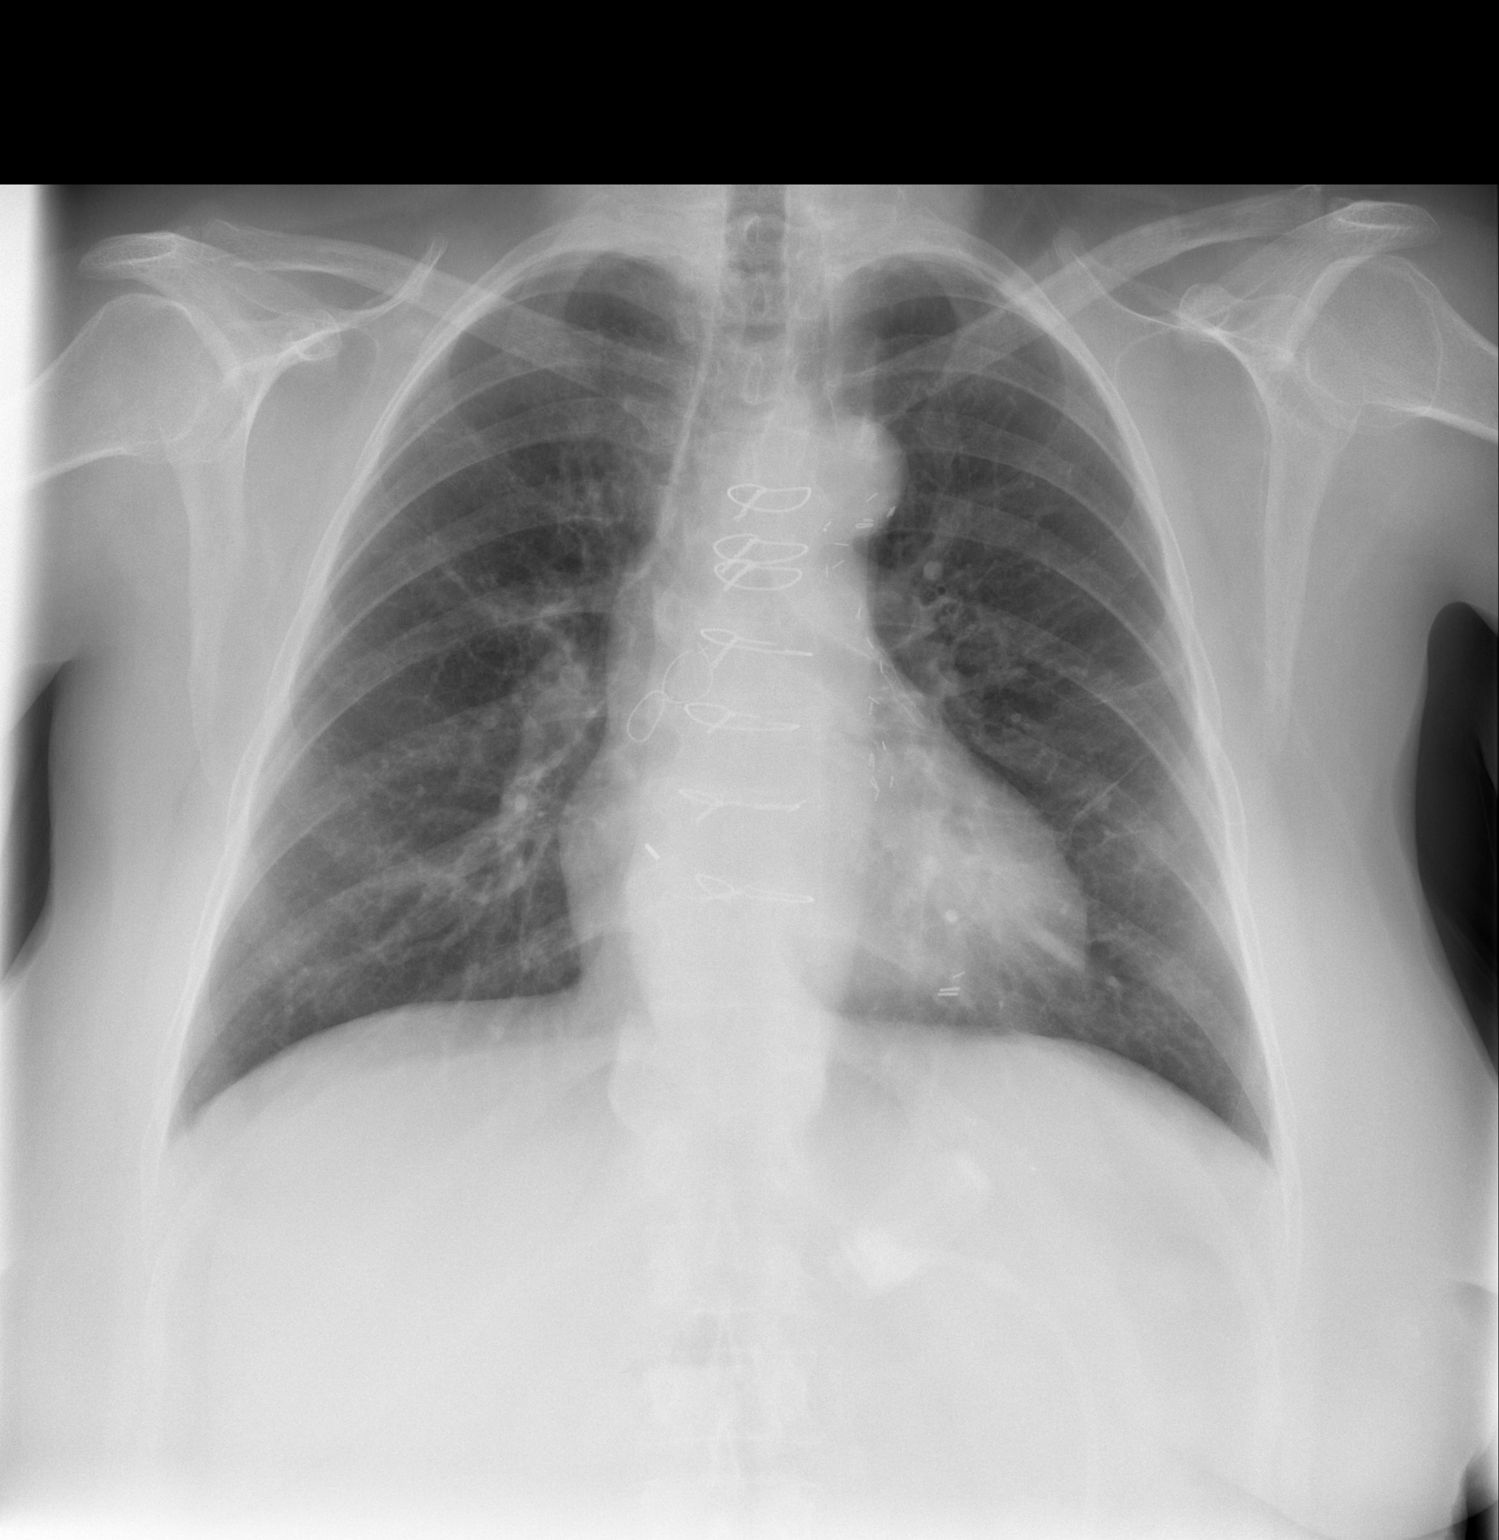

[w chest lat]
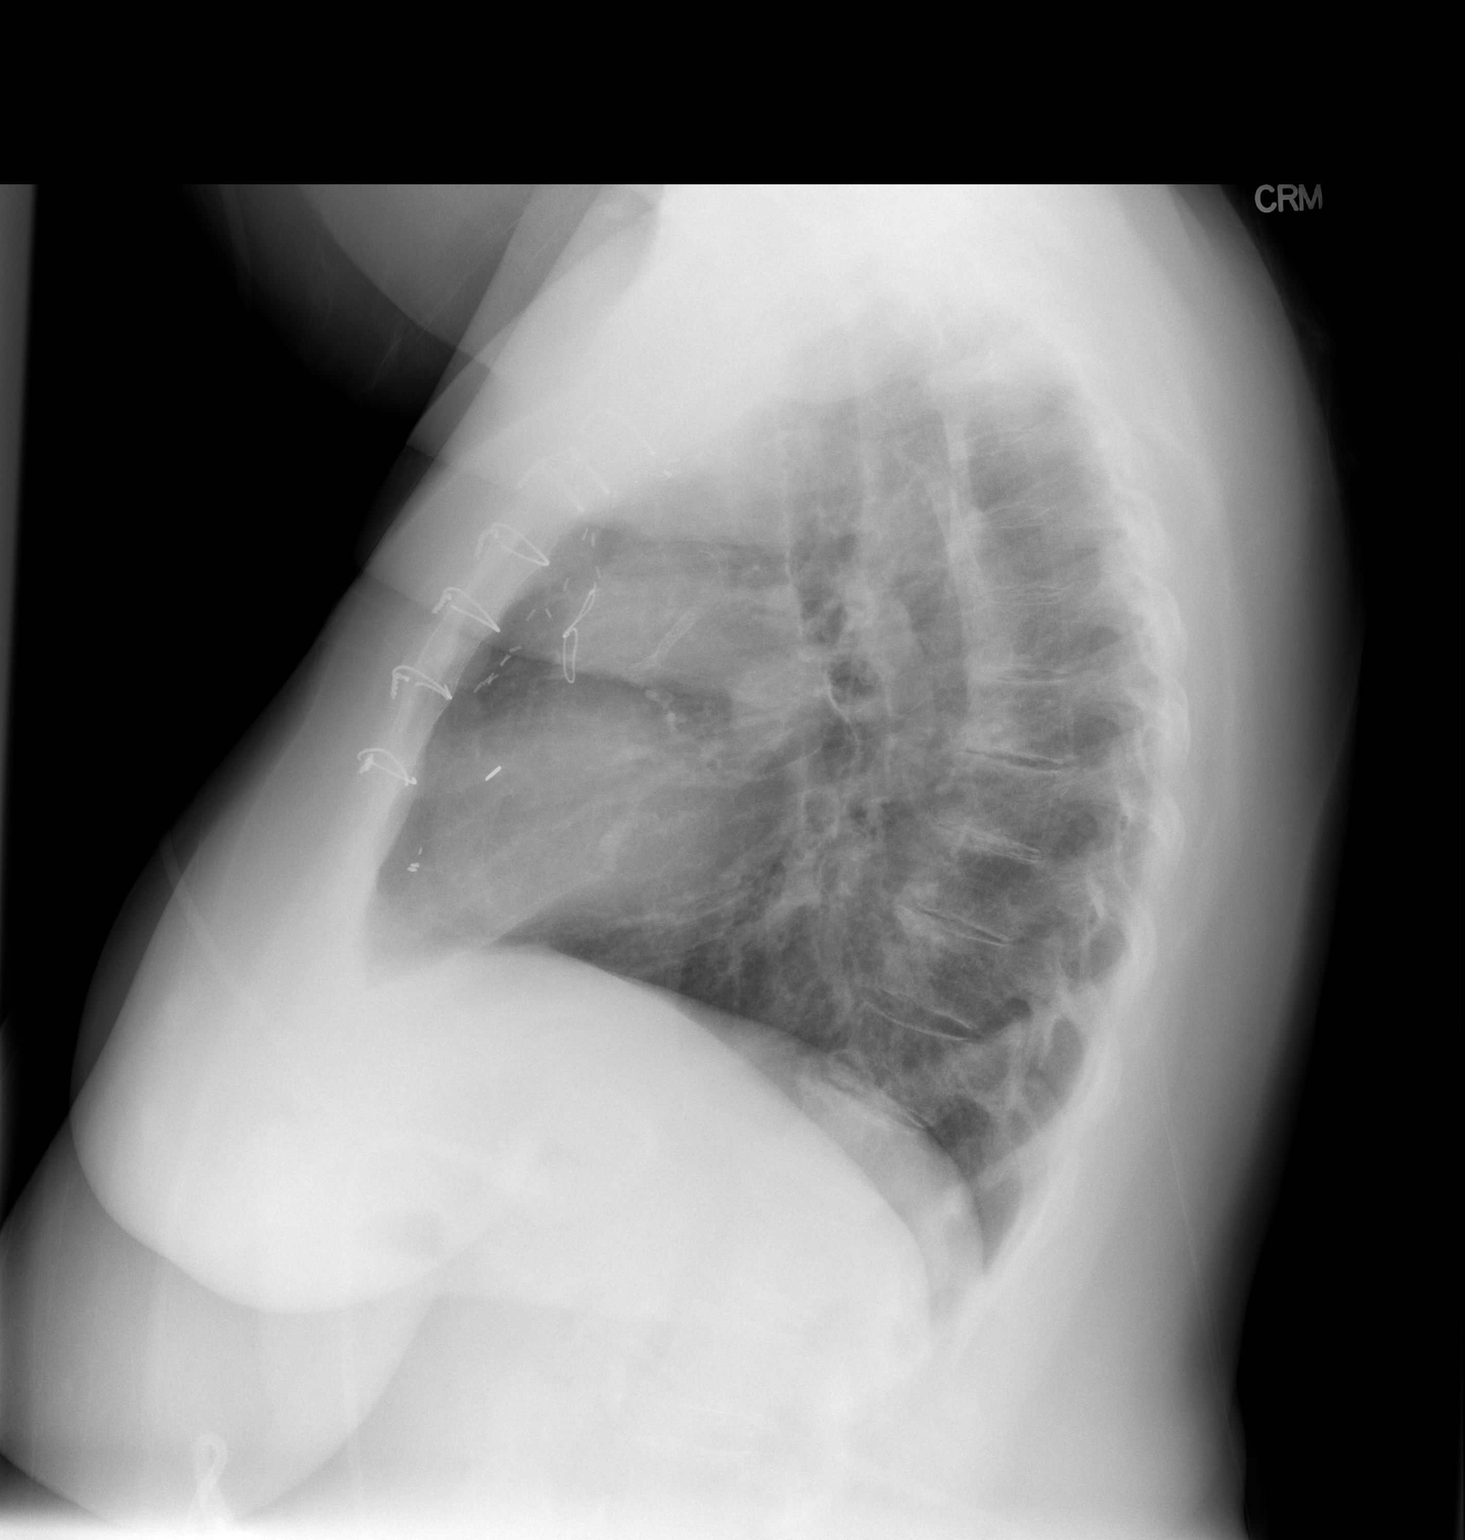

[2 of 2 positions shown; findings below may reference images not displayed]

FINDINGS: Cardiac silhouette is nonsuspicious, status post median sternotomy
for apparent coronary artery bypass grafting with Coronary artery
stent in place. Linear scarring in left midlung zone is unchanged.
Multiple fractured median sternotomy wires. No pleural effusions or
focal consolidations. No pneumothorax.

Lap band in place. Moderate degenerative change of the thoracic
spine.
IMPRESSION: No active cardiopulmonary disease.

  By: Mihiree Bariyk

## 2014-10-30 MED ORDER — MONTELUKAST SODIUM 10 MG PO TABS: 10.0000 mg | ORAL_TABLET | Freq: Every day | ORAL | Status: DC

## 2014-10-30 NOTE — Telephone Encounter (Signed)
Spoke with pt. ROV has been scheduled for 12/20/14 at 4:30pm. Rx has been sent to last until this appointment.

## 2014-11-05 ENCOUNTER — Ambulatory Visit: Payer: Medicare Other | Admitting: Cardiology

## 2014-11-05 ENCOUNTER — Other Ambulatory Visit: Payer: Self-pay | Admitting: Cardiology

## 2014-11-06 NOTE — Telephone Encounter (Signed)
Rx refill sent to patient pharmacy   

## 2014-11-17 ENCOUNTER — Other Ambulatory Visit: Payer: Self-pay | Admitting: Emergency Medicine

## 2014-11-20 ENCOUNTER — Telehealth: Payer: Self-pay | Admitting: *Deleted

## 2014-11-20 MED ORDER — ROSUVASTATIN CALCIUM 20 MG PO TABS: 20.0000 mg | ORAL_TABLET | Freq: Every day | ORAL | Status: DC

## 2014-11-20 NOTE — Telephone Encounter (Signed)
Received information from patient's prescription [employer]  she is able to get a 90 day supply  The same price as a 30 day supply.  RN called patient AND ASKED IF SHE WOULD LIKE FOR PRESCRIPTION  TO BE CHANGE.  PATIENT STATES YES , RN E-SENT IN NEW PRESCRIPTION INTO PHARMACY

## 2014-12-13 DIAGNOSIS — I129 Hypertensive chronic kidney disease with stage 1 through stage 4 chronic kidney disease, or unspecified chronic kidney disease: Secondary | ICD-10-CM | POA: Diagnosis not present

## 2014-12-13 DIAGNOSIS — Z940 Kidney transplant status: Secondary | ICD-10-CM | POA: Diagnosis not present

## 2014-12-13 DIAGNOSIS — N182 Chronic kidney disease, stage 2 (mild): Secondary | ICD-10-CM | POA: Diagnosis not present

## 2014-12-13 DIAGNOSIS — N189 Chronic kidney disease, unspecified: Secondary | ICD-10-CM | POA: Diagnosis not present

## 2014-12-13 DIAGNOSIS — E118 Type 2 diabetes mellitus with unspecified complications: Secondary | ICD-10-CM | POA: Diagnosis not present

## 2014-12-20 ENCOUNTER — Ambulatory Visit: Payer: Medicare Other | Admitting: Emergency Medicine

## 2014-12-21 ENCOUNTER — Ambulatory Visit: Payer: Medicare Other | Admitting: Cardiology

## 2014-12-31 ENCOUNTER — Ambulatory Visit: Payer: Medicare Other | Admitting: Cardiology

## 2015-01-17 ENCOUNTER — Other Ambulatory Visit: Payer: Self-pay | Admitting: Cardiology

## 2015-01-17 NOTE — Telephone Encounter (Signed)
Rx(s) sent to pharmacy electronically.  

## 2015-01-23 ENCOUNTER — Telehealth: Payer: Self-pay | Admitting: Emergency Medicine

## 2015-01-23 MED ORDER — CEFUROXIME AXETIL 500 MG PO TABS: 500.0000 mg | ORAL_TABLET | Freq: Two times a day (BID) | ORAL | Status: DC

## 2015-01-23 NOTE — Telephone Encounter (Signed)
Call in cefuroxime 500mg  bid x 5days She should try Delsym OTC 10ML as needed for cough

## 2015-01-23 NOTE — Telephone Encounter (Signed)
Pt returned call [phone]

## 2015-01-23 NOTE — Telephone Encounter (Signed)
Patient says that she is at Cancer center with her sister.  Her sister is dying from cancer and she is having to take care of her and her special needs son so she is unable to come in for an appointment right now.  She says that she doesn't have time for herself right now when she is having to take care of them.  She was coughing a lot on the phone when I was talking to her and she says that her throat is extremely sore, she doesn't want to give something to them.  She wants to know if there is any way that she can get an antibiotic and some cough medicine.  She understands that she has not been seen since 2013 and says she will schedule an appointment as soon as she can.    PW - please advise in RB absence.

## 2015-01-23 NOTE — Telephone Encounter (Signed)
Spoke with pt and advised of Dr Bettina Gavia recommendations.  Rx sent

## 2015-01-23 NOTE — Telephone Encounter (Signed)
lmtcb x1 for pt. Pt will need ROV she has not been seen since 2013.

## 2015-01-31 ENCOUNTER — Telehealth: Payer: Self-pay | Admitting: *Deleted

## 2015-01-31 NOTE — Telephone Encounter (Signed)
PATIENT LEFT A RENEWAL PERMANENT DISABILITY PARKING PLACARD TO BE FILLED OUT ON 01/30/15 DR HARDING SIGNED PLACARD.  PATIENT NOTIFIED AND PICKED UP FORM TODAY.

## 2015-02-07 ENCOUNTER — Other Ambulatory Visit: Payer: Self-pay | Admitting: Cardiology

## 2015-02-07 NOTE — Telephone Encounter (Signed)
Rx(s) sent to pharmacy electronically.  

## 2015-02-14 ENCOUNTER — Other Ambulatory Visit: Payer: Self-pay | Admitting: Emergency Medicine

## 2015-02-27 ENCOUNTER — Ambulatory Visit: Payer: Medicare Other | Admitting: Cardiology

## 2015-02-27 ENCOUNTER — Other Ambulatory Visit: Payer: Self-pay | Admitting: Emergency Medicine

## 2015-03-05 ENCOUNTER — Telehealth: Payer: Self-pay | Admitting: Emergency Medicine

## 2015-03-05 NOTE — Telephone Encounter (Signed)
Spoke with pt. Made her aware she has not been seen since 2013 and would need to make an appt for further refills at this time. She declined at this time and reports will call back when she is ready for an appt. Will sign off message. Nothing further needed

## 2015-03-21 IMAGING — MR MR LUMBAR SPINE WO/W CM
4 of 7 series · 27 of 48 positions shown · IV contrast (multihance)
Comparison: 01/28/2011

CLINICAL DATA: Chronic low back pain

EXAM:
MRI LUMBAR SPINE WITHOUT AND WITH CONTRAST
TECHNIQUE: Multiplanar and multiecho pulse sequences of the lumbar spine were
obtained without and with intravenous contrast.
CONTRAST:  17mL MULTIHANCE GADOBENATE DIMEGLUMINE 529 MG/ML IV SOLN

[Series 4: T1 · sagittal · 4.0mm · 0.55mm/px · 4 of 12 slices shown (1 of 2)]
[im 1/12]
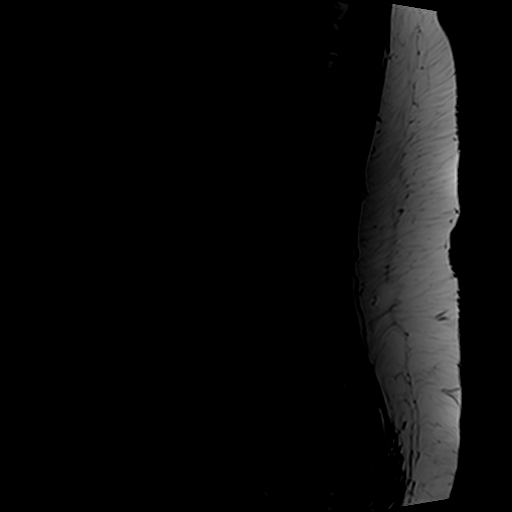
[im 4/12]
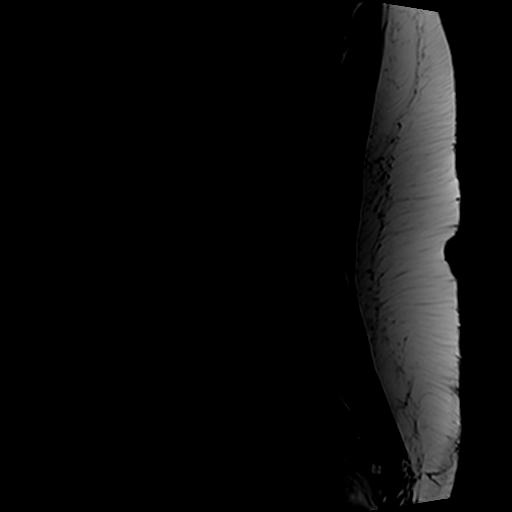
[im 8/12]
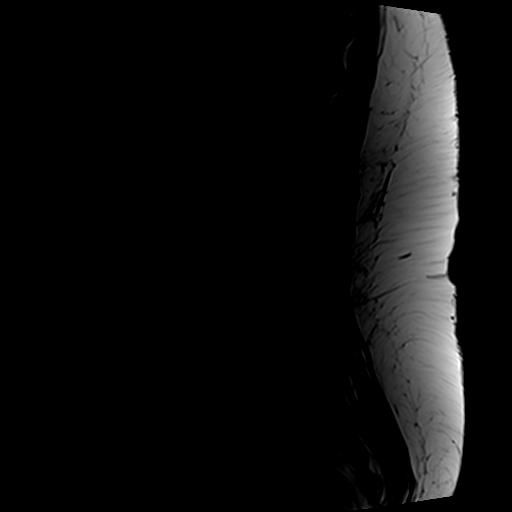
[im 12/12]
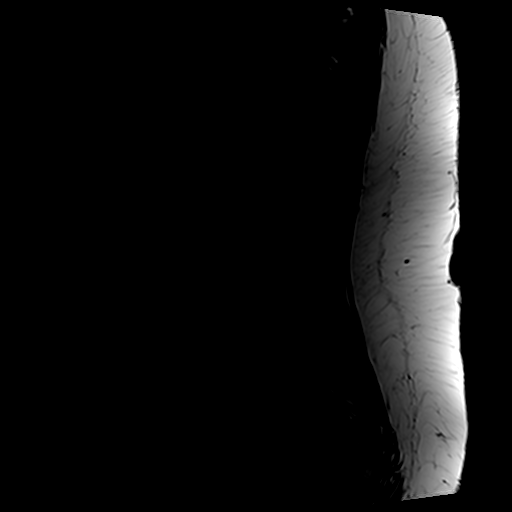

[Series 5: T2 · axial · 4.0mm · 0.70mm/px · z∈[-84,+87]mm · 11 of 32 slices shown]
[im 1/32]
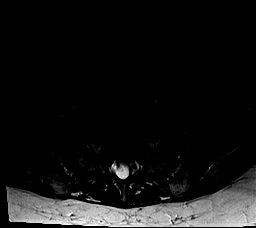
[im 4/32]
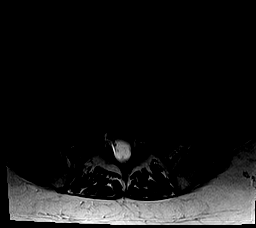
[im 7/32]
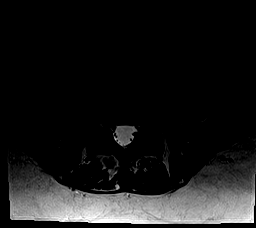
[im 10/32]
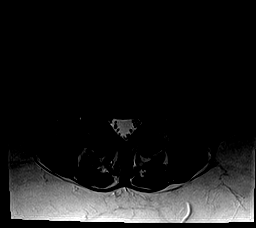
[im 13/32]
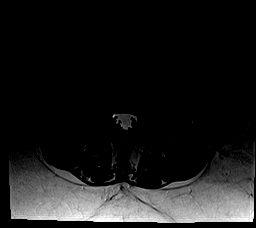
[im 16/32]
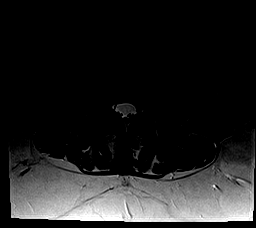
[im 19/32]
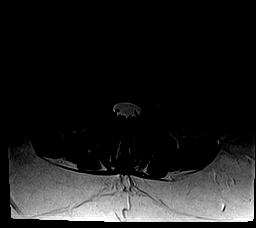
[im 22/32]
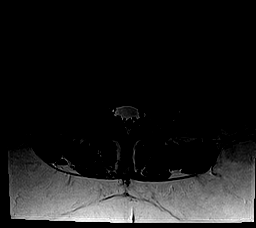
[im 25/32]
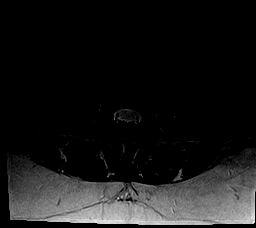
[im 28/32]
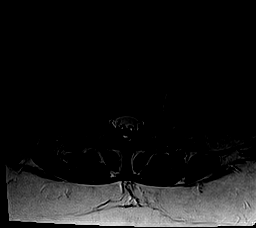
[im 32/32]
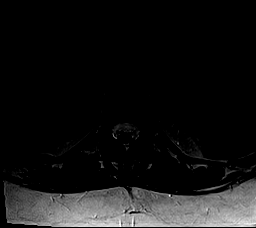

[Series 6: T1 · axial · 4.0mm · 0.35mm/px · z∈[-84,+67]mm · 8 of 32 slices shown (2 of 2)]
[im 1/32]
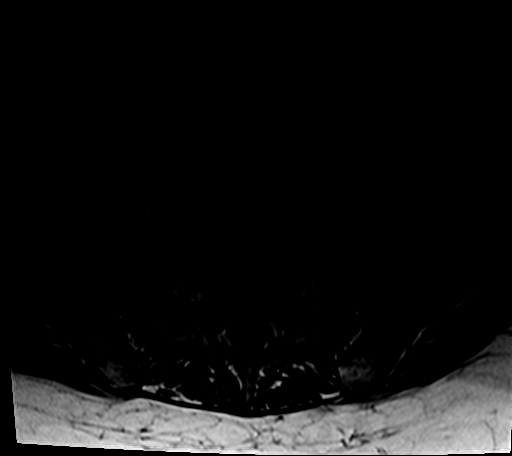
[im 4/32]
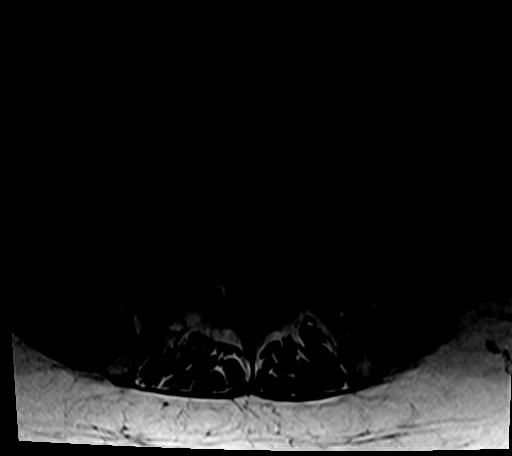
[im 7/32]
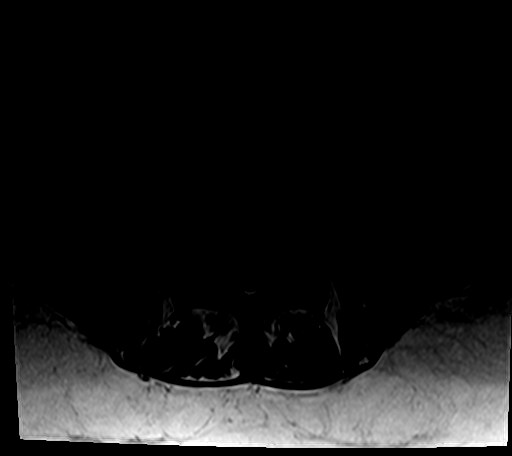
[im 10/32]
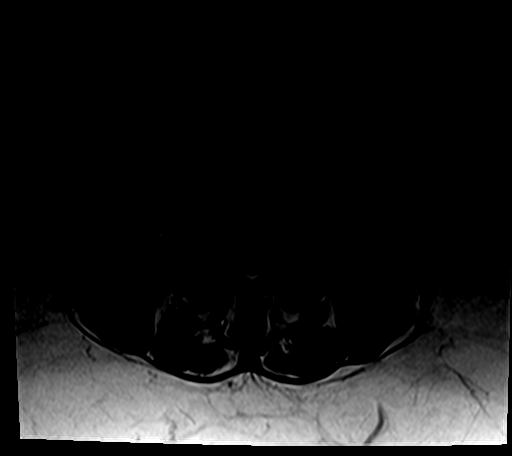
[im 13/32]
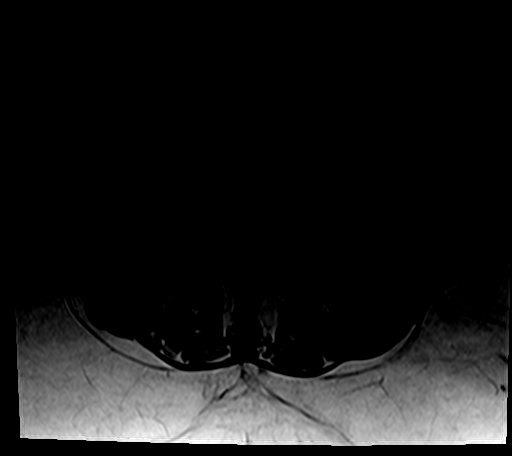
[im 16/32]
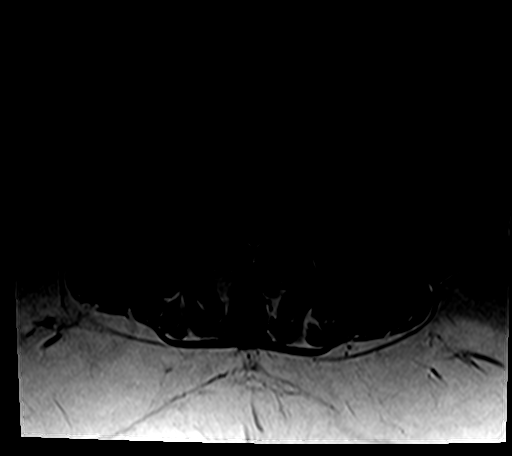
[im 19/32]
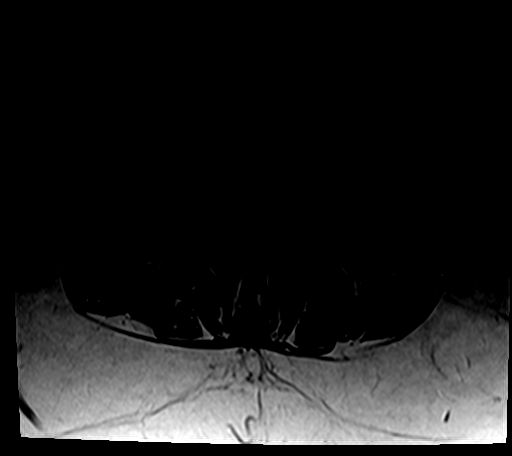
[im 28/32]
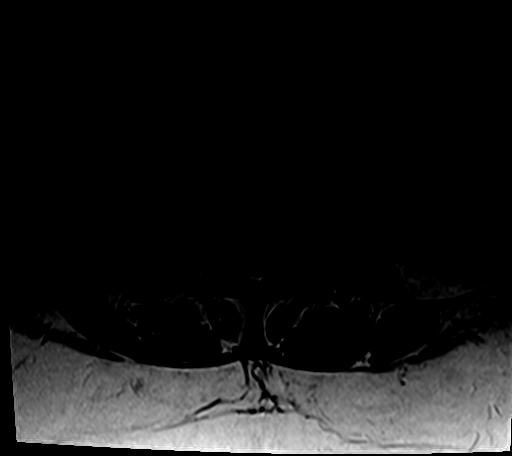

[Series 7: T2 post-contrast · sagittal · 4.0mm · 0.55mm/px · 4 of 12 slices shown]
[im 1/12]
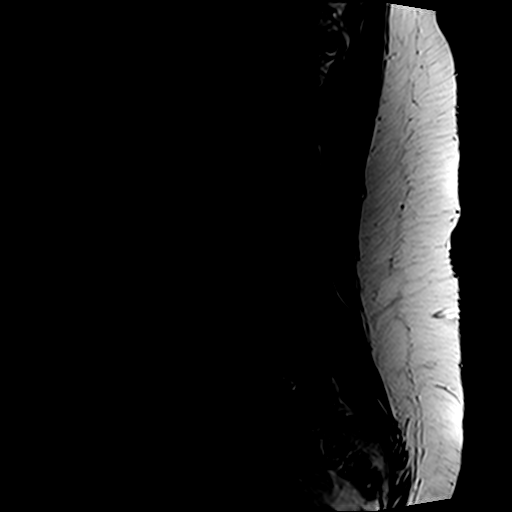
[im 4/12]
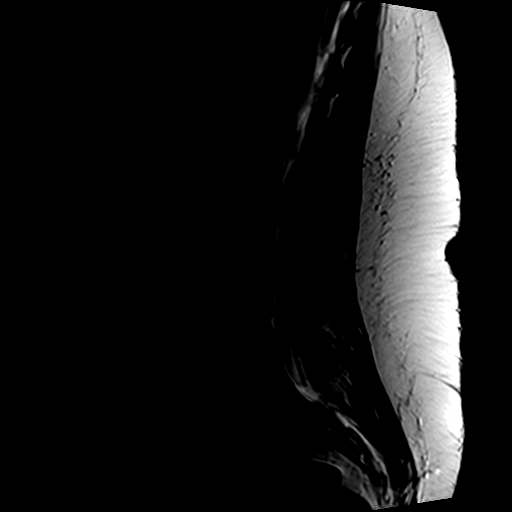
[im 8/12]
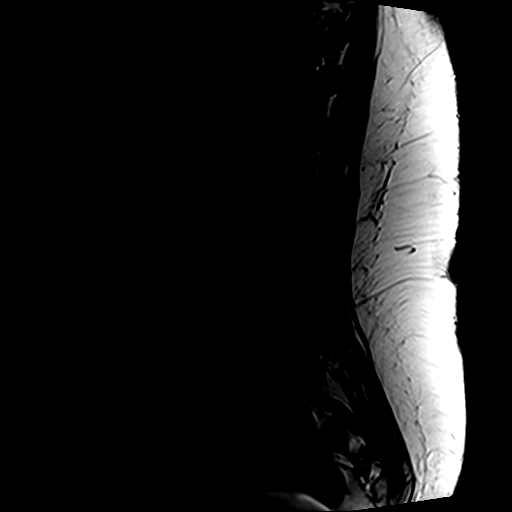
[im 12/12]
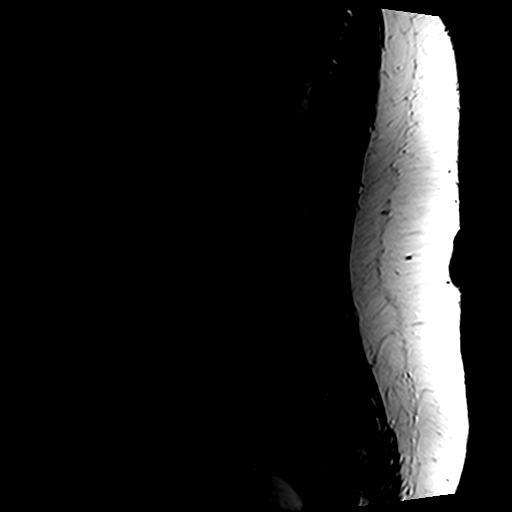

[27 of 48 positions shown; findings below may reference images not displayed]

FINDINGS: The vertebral bodies of the lumbar spine are normal in size and
alignment. There is normal bone marrow signal demonstrated
throughout the vertebra. There is degenerative disc disease at
T11-12 with disc height loss. The remainder the disc spaces are
maintained. There are no areas of abnormal enhancement.

The spinal cord is normal in signal and contour. The cord terminates
normally at L1 . The nerve roots of the cauda equina and the filum
terminale are normal.

The visualized portions of the SI joints are unremarkable.

Severely atrophic bilateral kidneys.

T11-12: Mild broad-based disc bulge.

T12-L1: No significant disc bulge. No evidence of neural foraminal
stenosis. No central canal stenosis.

L1-L2: No significant disc bulge. No evidence of neural foraminal
stenosis. No central canal stenosis.

L2-L3: No significant disc bulge. No evidence of neural foraminal
stenosis. No central canal stenosis.

L3-L4: No significant disc bulge. No evidence of neural foraminal
stenosis. No central canal stenosis.

L4-L5: Mild broad-based disc bulge with a shallow left foraminal
disc protrusion abutting the left L4 nerve root. Moderate bilateral
facet arthropathy, right greater than left. No evidence of neural
foraminal stenosis. No central canal stenosis.

L5-S1: No significant disc bulge. No evidence of neural foraminal
stenosis. No central canal stenosis.
IMPRESSION: 1. At L4-5 there is a mild broad-based disc bulge with a shallow
left foraminal disc protrusion abutting the left L4 nerve root.
Moderate bilateral facet arthropathy, right greater than left.

## 2015-04-02 ENCOUNTER — Ambulatory Visit (INDEPENDENT_AMBULATORY_CARE_PROVIDER_SITE_OTHER): Payer: Medicare Other | Admitting: Emergency Medicine

## 2015-04-02 ENCOUNTER — Encounter: Payer: Self-pay | Admitting: Emergency Medicine

## 2015-04-02 VITALS — BP 130/76 | HR 82 | Ht 61.0 in | Wt 224.0 lb

## 2015-04-02 DIAGNOSIS — R0600 Dyspnea, unspecified: Secondary | ICD-10-CM

## 2015-04-02 DIAGNOSIS — J309 Allergic rhinitis, unspecified: Secondary | ICD-10-CM | POA: Diagnosis not present

## 2015-04-02 DIAGNOSIS — G4730 Sleep apnea, unspecified: Secondary | ICD-10-CM

## 2015-04-02 DIAGNOSIS — J45909 Unspecified asthma, uncomplicated: Secondary | ICD-10-CM | POA: Diagnosis not present

## 2015-04-02 MED ORDER — ALBUTEROL SULFATE HFA 108 (90 BASE) MCG/ACT IN AERS: 2.0000 | INHALATION_SPRAY | Freq: Four times a day (QID) | RESPIRATORY_TRACT | Status: DC | PRN

## 2015-04-02 MED ORDER — FLUTICASONE PROPIONATE 50 MCG/ACT NA SUSP: NASAL | Status: DC

## 2015-04-02 MED ORDER — MONTELUKAST SODIUM 10 MG PO TABS: 10.0000 mg | ORAL_TABLET | Freq: Every day | ORAL | Status: DC

## 2015-04-02 NOTE — Patient Instructions (Addendum)
We will restart your singulair daily, flonase nasal spray twice a day Stop budesonide nebulizers.  We will perform full pulmonary function testing  We will do a trial of albuterol 1-2 puffs up to every 4 hours if needed for shortness of breath.  Your breathing will also benefit from weight loss.  We will decide in the future whether you need a repeat sleep study Follow with Dr Lamonte Sakai in 1 month

## 2015-04-02 NOTE — Assessment & Plan Note (Signed)
Activities this time. She needs to start back on her single layer and her fluticasone nasal spray

## 2015-04-02 NOTE — Assessment & Plan Note (Signed)
Diagnoses unclear but she has been treated with inhaled steroids in the past. She does not tolerate beta agonists well like to do a trial of low-dose albuterol to see if she benefits. Most Importantly she needs repeat pulmonary function testing To help guide our plans

## 2015-04-02 NOTE — Assessment & Plan Note (Signed)
Not currently on treatment. She was able to stop this when she lost weight after her lap band surgery. She has since gained 35 pounds and I wonder whether her sleep apnea may be active again. We will revisit a possible repeat polysomnogram depending on her response to our other treatments

## 2015-04-02 NOTE — Progress Notes (Signed)
59 year old female patient has a known history of obesity, status post lap band surgery with significant weight loss. Patient also has a history of renal transplant on chronic immunosuppression with Imuran and prednisone. The patient has a history of sleep apnea now much improved after significant weight loss.  Still needs repeat PSG to prove that OSA has resolved. She has stopped using CPAP.   May 15, 2009 --Present for an acute office visit. Complains of coughing and chest tightness/congestion x 4wks. The cough has started a month ago and had one episode of bloody-tinged phlegm. Didn't take any OTC to help the cough. Has had some episode of vomitted after eating , this is chronic since lap band. Denies chest pain, abd. pain, melena. orthopnea, hemoptysis, fever, n/v/d, edema, headache   ROV 12/18/09 -- ROV to follow OSA/OHS, hx lap band sgy and GERD/reflux. Tells me that she was exposed to cleaning solution, bleach in the last week. Having some chest tightness, dry cough. Doing sinus rinses + nasonex. Taking omeprazole.   ROV 01/23/10 -- Acute visit. Has had fever, aches, head congestion, tight chest. Has had some dry cough, some nausea. Having trouble getting a deep breath.   July 30, 2010 --Presents for an acute office viist. Complains of sinus pressure/congestion, PND and sore throat, right ear discomfort, prod cough with yellow mucus, increased SOB, wheezing x 2 weeks . Has been doing well until last 2 weeks. Does not want to be sick for the holidays, family is coming in to visit.Denies chest pain, orthopnea, hemoptysis, fever, n/v/d, edema. Has alot of sinus congestion and pressure.   ROV 04/29/11 -- OSA/OHS, hx lap band sgy and GERD/reflux, often assoc w cough. She was hospitalized in Jan - Feb '12 for aspiration of gastric contents. She had to have the lap-band loosened because it was contributing to the aspiration. She gained 40 lbs and hasn't lost it. She presents today c/o sinus  congestion, HA beginning 1 month ago, some drainage to back of throat. Has been on her flonase and singulair. Doesn't use pulmicort anymore. No purulent mucous, no fever.  >>saline nasal rinses   05/06/11  Acute OV  Patient is no better since last visit with Dr. Lamonte Sakai. Patient c/o right ear pain, sore throat is worse since last visit.  Also feels fatigued. Throat is very sore . Singulair , flonase and saline rinses are not helping.  OTC is not helping.  >>Omnicef rx   Acute OV 12/23/11 --  Patient c/o ear pain in both ears, sore throat, chest tightness, chest pain mostly in her back, cough with thick stringy light yellow mucus, and wheezing that is mostly at night x 2 weeks. Just finished Doxy for boil.  CXR today shows  No acute process.  No fever or hemoptysis   ROV 02/05/12 -- OSA/OHS, hx lap band sgy and GERD/reflux, often assoc w cough. Treated for persistent cough and drainage as above. She is still experiencing some drainage, tolerating sinus washes and flonase. Still has frequent reflux/emesis - almost certainly causing UA irritation. [facility] follows her lap-band.  Took diflucan for thrush 2 weeks ago.  She is having pain on inspiration, especially when she lays down supine.   ROV 04/02/15 -- Follow-up visit for chronic cough in the setting of allergic rhinitis and GERD. She also has obstructive sleep apnea and a history of significant weight loss after lap band surgery, hx of a renal transplant.  She has been maintained on singular and flonase, but ran out of these  about 1-2 months ago. Since she ran out she is having more nasal congestion, chest congestion.   She has gained about 35 lbs since our last visit in 2013. Her lap band was loosened due to reflux and aspiration over the last several years. Remains prednisone and imuran.  She does not have daytime sleepiness. No naps. She is unsure about whether she snores.    PE :  Filed Vitals:   04/02/15 1549  BP: 130/76  Pulse: 82  Height:  5\' 1"  (1.549 m)  Weight: 224 lb (101.606 kg)  SpO2: 96%   Gen: Pleasant, obese, has gained wt, in no distress,  normal affect  ENT post pharynx erythema, no purulence  Neck: No JVD, no TMG, no carotid bruits  Lungs: No use of accessory muscles, no dullness to percussion, clear without rales or rhonchi  Cardiovascular: RRR, heart sounds normal, no murmur or gallops, no peripheral edema  Musculoskeletal: No deformities, no cyanosis or clubbing  Neuro: alert, non focal  Skin: Warm, no lesions or rashes  Sleep apnea Not currently on treatment. She was able to stop this when she lost weight after her lap band surgery. She has since gained 35 pounds and I wonder whether her sleep apnea may be active again. We will revisit a possible repeat polysomnogram depending on her response to our other treatments  Chronic allergic rhinitis Activities this time. She needs to start back on her single layer and her fluticasone nasal spray  Extrinsic asthma Diagnoses unclear but she has been treated with inhaled steroids in the past. She does not tolerate beta agonists well like to do a trial of low-dose albuterol to see if she benefits. Most Importantly she needs repeat pulmonary function testing To help guide our plans

## 2015-04-16 DIAGNOSIS — N2581 Secondary hyperparathyroidism of renal origin: Secondary | ICD-10-CM | POA: Diagnosis not present

## 2015-04-16 DIAGNOSIS — E785 Hyperlipidemia, unspecified: Secondary | ICD-10-CM | POA: Diagnosis not present

## 2015-04-16 DIAGNOSIS — E118 Type 2 diabetes mellitus with unspecified complications: Secondary | ICD-10-CM | POA: Diagnosis not present

## 2015-04-16 DIAGNOSIS — I129 Hypertensive chronic kidney disease with stage 1 through stage 4 chronic kidney disease, or unspecified chronic kidney disease: Secondary | ICD-10-CM | POA: Diagnosis not present

## 2015-04-16 DIAGNOSIS — N182 Chronic kidney disease, stage 2 (mild): Secondary | ICD-10-CM | POA: Diagnosis not present

## 2015-04-16 DIAGNOSIS — Z940 Kidney transplant status: Secondary | ICD-10-CM | POA: Diagnosis not present

## 2015-04-19 ENCOUNTER — Telehealth: Payer: Self-pay | Admitting: Emergency Medicine

## 2015-04-19 NOTE — Telephone Encounter (Signed)
Patient notified. She will go to urgent care. Nothing further needed.

## 2015-04-19 NOTE — Telephone Encounter (Signed)
She has too many allergies to attempt to treat her over the phone for aching all over. rec to UC or ER

## 2015-04-19 NOTE — Telephone Encounter (Signed)
Patient says that she came to see RB a couple of weeks ago, she has been without singulair and nose spray.  She feels like she has the flu.  She is having body aches all over body, feeling very weak.  Some cough, chest very tight, even using inhaler, not getting any relief. Pt requesting antibiotic.  Pharmacy: [facility] - [address]  Allergies  Allergen Reactions  . Codeine Nausea And Vomiting  . Erythromycin Nausea And Vomiting  . Hydromorphone Hcl Nausea And Vomiting  . Morphine And Related Nausea And Vomiting  . Nalbuphine Nausea And Vomiting  . Niaspan [Niacin Er] Other (See Comments)    Mouth blisters  . Sulfonamide Derivatives Other (See Comments)    Reaction: per patient "tears her stomach up"  . Tetracycline Hives

## 2015-04-29 ENCOUNTER — Ambulatory Visit: Payer: Medicare Other | Admitting: Cardiology

## 2015-05-07 ENCOUNTER — Ambulatory Visit: Payer: Medicare Other | Admitting: Emergency Medicine

## 2015-05-20 ENCOUNTER — Ambulatory Visit (INDEPENDENT_AMBULATORY_CARE_PROVIDER_SITE_OTHER): Payer: Medicare Other | Admitting: Cardiology

## 2015-05-20 VITALS — BP 130/84 | HR 71 | Ht 61.0 in | Wt 219.8 lb

## 2015-05-20 DIAGNOSIS — I10 Essential (primary) hypertension: Secondary | ICD-10-CM

## 2015-05-20 DIAGNOSIS — I152 Hypertension secondary to endocrine disorders: Secondary | ICD-10-CM

## 2015-05-20 DIAGNOSIS — I2510 Atherosclerotic heart disease of native coronary artery without angina pectoris: Secondary | ICD-10-CM | POA: Diagnosis not present

## 2015-05-20 DIAGNOSIS — Z23 Encounter for immunization: Secondary | ICD-10-CM

## 2015-05-20 DIAGNOSIS — I6523 Occlusion and stenosis of bilateral carotid arteries: Secondary | ICD-10-CM

## 2015-05-20 DIAGNOSIS — Z9861 Coronary angioplasty status: Secondary | ICD-10-CM | POA: Diagnosis not present

## 2015-05-20 DIAGNOSIS — E1159 Type 2 diabetes mellitus with other circulatory complications: Secondary | ICD-10-CM

## 2015-05-20 DIAGNOSIS — I480 Paroxysmal atrial fibrillation: Secondary | ICD-10-CM

## 2015-05-20 DIAGNOSIS — E785 Hyperlipidemia, unspecified: Secondary | ICD-10-CM | POA: Diagnosis not present

## 2015-05-20 DIAGNOSIS — I2119 ST elevation (STEMI) myocardial infarction involving other coronary artery of inferior wall: Secondary | ICD-10-CM

## 2015-05-20 DIAGNOSIS — Z951 Presence of aortocoronary bypass graft: Secondary | ICD-10-CM

## 2015-05-20 DIAGNOSIS — I6521 Occlusion and stenosis of right carotid artery: Secondary | ICD-10-CM

## 2015-05-20 NOTE — Progress Notes (Signed)
PCP: Placido Sou, MD  Clinic Note: Chief Complaint  Patient presents with  . Follow-up  . Coronary Artery Disease    HPI: Katherine [name] is a 59 y.o. female with a PMH below who presents today for CAD & PAD & Afib. Former Dr. [name] patient.  -- Unstable Angina Jan 2015 - PCI of SVG-Diag.  Katherine [name] was last seen on Aug 01, 2014  Recent Hospitalizations: None Dec 2015 - admitted for dehydration -> Afib RVR.n  Studies Reviewed: none  Since I last saw her, she is adopted a 71-year-old son who has several social issues including ADHD.  Interval History: She presents really without any major complaints. She is able do her daily activities of living including cleaning the house and walking around chasing after 80-year-old son. She says he occasionally has rare episodes of different locations of chest pain that are usually sharp in nature mostly musculoskeletal. Nothing like her anginal equivalent symptoms. She has gained a lot of weight because of issues with her lap band. She really hasn't had much the way of any palpitations or significant blood pressure elevations by monitoring her to metoprolol dosing. No major rapid irregular heartbeat sensations.  Basically for cardiac standpoint her review of systems is as follows: No anginal chest pain or shortness of breath with rest or exertion.  No PND, orthopnea or edema.  No palpitations, lightheadedness, dizziness, weakness or syncope/near syncope. No TIA/amaurosis fugax symptoms. No claudication.  ROS: A comprehensive was performed. Review of Systems  Constitutional: Negative for malaise/fatigue.       Weight gain  HENT: Negative for nosebleeds.   Eyes: Negative for blurred vision.  Cardiovascular: Negative for claudication.  Gastrointestinal: Negative for blood in stool and melena.  Genitourinary: Negative for hematuria.  Musculoskeletal: Positive for myalgias (Fibromyalgia), back pain and joint pain (hands,  wrists & hips/knees).       Rheumatology dropped her b/c she would not go to pain management.  Neurological: Negative for weakness.  Endo/Heme/Allergies: Bruises/bleeds easily.  Psychiatric/Behavioral: The patient is nervous/anxious.   All other systems reviewed and are negative.   Past Medical History  Diagnosis Date  . S/p cadaver renal transplant [place]  . 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 07/1993    Rescue PTCA of RCA -- referred for CABG.  . CAD in native artery 07/1993     3 Vessel Disease (LAD-D1 & RCA) -- CABG  . 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 Servando Snare: LIMA-LAD, SVG-bifurcatingD1, SVG-rPDA  . CAD (coronary artery disease) of bypass graft 5/01; 3/'02, 8/'03, 10/'04; 1/15    PCI x 5 to SVG-D1   . CAD S/P percutaneous coronary angioplasty     PCI to SVG-D1 insertion/native D1 x 4 = '01 -(S660 BMS 2.5 x 9 - insertion into D1; '02 - distal overlap ACS Pixel 2.5 x 8  BMS; '03 distal/native ISR/Thrombosis - Pixel 2.5 x 13; '04 - ISR-  Taxus 2.5 x 20 (covered all);; 1/15 - mid SVG-D1 (50% distal ISR) - Promus P 2.75 x 20 -- 2.8 mm  . Unstable angina ([place]) 5/01; 3/'02, 8/'03, 10/'04; 1/15    x 5 occurences since Inf-Post STEMI in 1994  . Diabetes mellitus type 2 in obese ([place])   . Dyslipidemia, goal LDL below 70     08/2012: TC 137, TG 200, HDL 32!, LDL 45; on statin (followed by Dr.Deterding)  . Bilateral carotid artery stenosis 07/2009  Mild - moderate ~<0-49% internal carotid bilaterally; right bullb 50-69%  . Obesity     s/p Lap Band 04/2004- Port Replacement 10/09 & 2/10 for infection; Dr. Excell Seltzer.  [name] Kitchen MRSA (methicillin resistant staph aureus) culture positive   . Hypertension associated with diabetes ([place])   . Depression with anxiety   . Recurrent boils     Bilateral Groin  . Fibromyalgia   . Rheumatoid arthritis ([place])     Per Patient Report; associated with OA  . H/O: GI bleed   . PAF (paroxysmal atrial fibrillation) ([place])      Noted on CardioNet Monitor 11/5-18 - rate ~112  . PAD (peripheral artery disease) ([place]) 08/2013    LEA Dopplers to be read by Dr. Fletcher Anon  . OSA on CPAP     oxygen @night  @ 2L  . Stroke [facility]) 2012    "right eye stroke- half blind now"  . Enlarged heart     "told one time"  . COPD mixed type ([place])     Followed by Dr. Lamonte Sakai "pulmonologist said no COPD"  . Depression   . Anxiety   . GERD (gastroesophageal reflux disease)   . Glomerulonephritis, chronic, rapidly progressive 50  . ESRD (end stage renal disease) ([place]) 1991    s/p Cadaveric Renal Transplant [facility] - Dr. Jimmy Footman)     Past Surgical History  Procedure Laterality Date  . Laparoscopic gastric banding  04/2004; 10/'09, 2/'10    Port Replacement x 2  . Catheter removal    . Coronary artery bypass graft  1995    LIMA-LAD, SVG-RPDA, SVG-D1  . Kidney transplant  1991  . Wrist fistula repair Left     dialysis for one year  . Tubal ligation    . Cardiac catheterization  5/'01, 3/'02, 8/'03, 10/'04; 1/'15    08/22/2013: LAD & RCA 100%; LIMA-LAD & SVG-rPDA patent; Cx-- OM1 60%, OM2 ostial ~50%; SVG-D1 - 80% mid, 50% distal ISR --PCI  . Port-a-cath removal      kidney  . Incise and drain abcess    . Cardiac catheterization  5/'01, 3/'02, 8/'03, 10/'04; 1/'15    08/22/2013: LAD & RCA 100%; LIMA-LAD & SVG-rPDA patent; Cx-- OM1 60%, OM2 ostial ~50%; SVG-D1 - 80% mid, 50% distal ISR --PCI  . Percutaneous coronary stent intervention (pci-s)  5/'01, 3/'02, 8/'03, 10/'04;    '01 - S660 BMS 2.5 x 9 - dSVG-D1 into D1; '02- post-stent stenosis - 2.5 x 8 Pixel BMS; '8\03: ISR/Thrombosis into native D1 - AngioJet, 2.5 x 13 Pixel; '04 - ISR 95% - covered stented area with Taxus DES 2.5 mm x 20 (2.88)  . Percutaneous coronary stent intervention (pci-s)  08/22/2013    mid SVG-D1 80%; distal stent ~50% ISR; Promus [place] DES 2.75 mm xc 20 mm (2.8 mm)  . Transthoracic echocardiogram  02/2013    NL LV Size, EF 55-60%; MAC with Mild MR.  . Lower  extremity arterial dopplers  08/2013    ABI: R 0.96, L 1.04  . Left heart catheterization with coronary/graft angiogram N/A 08/23/2013    Procedure: LEFT HEART CATHETERIZATION WITH Beatrix Fetters;  Surgeon: Wellington Hampshire, MD;  Location[facility] CATH LAB;  Service: Cardiovascular;  Laterality: N/A;  . Percutaneous coronary stent intervention (pci-s)  08/23/2013    Procedure: PERCUTANEOUS CORONARY STENT INTERVENTION (PCI-S);  Surgeon: Wellington Hampshire, MD;  Location: [facility] CATH LAB;  Service: Cardiovascular;;  . Coronary angioplasty  1994    x5  . Multiple tooth extractions  age 8  . Cholecystectomy N/A 10/29/2014    Procedure: LAPAROSCOPIC CHOLECYSTECTOMY WITH INTRAOPERATIVE CHOLANGIOGRAM;  Surgeon: Excell Seltzer, MD;  Location: [facility] ORS;  Service: General;  Laterality: N/A;    Prior to Admission medications   Medication Sig Start Date End Date Taking? Authorizing Provider  acetaminophen (TYLENOL) 500 MG tablet Take 1,500 mg by mouth every 6 (six) hours as needed for moderate pain or headache.   Yes Historical Provider, MD  albuterol (PROVENTIL HFA;VENTOLIN HFA) 108 (90 BASE) MCG/ACT inhaler Inhale 2 puffs into the lungs every 6 (six) hours as needed for wheezing or shortness of breath. 04/02/15  Yes Collene Gobble, MD  azaTHIOprine (IMURAN) 50 MG tablet Take 125 mg by mouth every evening.    Yes Historical Provider, MD  calcitRIOL (ROCALTROL) 0.25 MCG capsule Take 0.25 mcg by mouth every morning.    Yes Historical Provider, MD  clopidogrel (PLAVIX) 75 MG tablet TAKE 1 TABLET (75 MG TOTAL) BY MOUTH DAILY. 11/06/14  Yes [name], MD  cyclobenzaprine (FLEXERIL) 10 MG tablet Take 10 mg by mouth at bedtime.    Yes Historical Provider, MD  diltiazem (CARDIZEM CD) 120 MG 24 hr capsule TAKE 1 CAPSULE (120 MG TOTAL) BY MOUTH DAILY. 02/07/15  Yes [name], MD  DULoxetine (CYMBALTA) 30 MG capsule Take 30 mg by mouth every morning.    Yes Historical Provider, MD  Ferrous Sulfate Dried 45 MG  TBCR Take 1 tablet by mouth every morning.   Yes Historical Provider, MD  fluticasone (FLONASE) 50 MCG/ACT nasal spray PLACE 2 SPRAYS INTO THE NOSE DAILY 04/02/15  Yes Collene Gobble, MD  glimepiride (AMARYL) 4 MG tablet Take 4 mg by mouth daily before breakfast. 05/16/15  Yes Historical Provider, MD  LORazepam (ATIVAN) 1 MG tablet Take 1 mg by mouth every morning.    Yes Historical Provider, MD  metoprolol tartrate (LOPRESSOR) 25 MG tablet Take half tablet (12.5 mg total) by mouth daily. Take whole tablet (25 mg total) by mouth daily if SBP > 150.   Yes Historical Provider, MD  montelukast (SINGULAIR) 10 MG tablet Take 1 tablet (10 mg total) by mouth daily. 04/02/15  Yes Collene Gobble, MD  nitroGLYCERIN (NITROSTAT) 0.4 MG SL tablet Place 1 tablet (0.4 mg total) under the tongue every 5 (five) minutes as needed for chest pain. 09/11/13  Yes [name], MD  omeprazole (PRILOSEC) 40 MG capsule Take 40 mg by mouth every morning.    Yes Historical Provider, MD  predniSONE (DELTASONE) 5 MG tablet Take 5 mg by mouth every morning.    Yes Historical Provider, MD  promethazine (PHENERGAN) 25 MG tablet Take 25 mg by mouth at bedtime as needed for nausea or vomiting. For nausea and sleep   Yes Historical Provider, MD  rosuvastatin (CRESTOR) 20 MG tablet Take 1 tablet (20 mg total) by mouth daily. 11/20/14  Yes [name], MD  sertraline (ZOLOFT) 100 MG tablet Take 200 mg by mouth every morning.    Yes Historical Provider, MD   Allergies  Allergen Reactions  . Codeine Nausea And Vomiting  . Erythromycin Nausea And Vomiting  . Hydromorphone Hcl Nausea And Vomiting  . Morphine And Related Nausea And Vomiting  . Nalbuphine Nausea And Vomiting  . Niaspan [Niacin Er] Other (See Comments)    Mouth blisters  . Sulfonamide Derivatives Other (See Comments)    Reaction: per patient "tears her stomach up"  . Tetracycline Hives    Social History  Social History  . Marital Status: Married    Spouse  Name: N/A  . Number of Children: N/A  . Years of Education: N/A   Social History Main Topics  . Smoking status: Former Smoker -- 1.00 packs/day for 30 years    Types: Cigarettes    Quit date: 08/17/2002  . Smokeless tobacco: Never Used  . Alcohol Use: No  . Drug Use: No  . Sexual Activity: Not Asked   Other Topics Concern  . None   Social History Narrative   She is currently married, and the caregiver of her husband who is recovering from surgery for tongue cancer now diagnosed with lung cancer. Prior to his diagnosis of her husband, she actually had adopted a 42-year-old child who she knows caring for as well. With all the surgeries, they have been quite financially troubled. Thanks the help of her community and church, they have been able to stay "alfoat."     She is a former smoker who quit in 2004 after a 30-pack-year history.   She is active chasing a 93-year-old child, does not do routine exercise. She's been quite depressed with the condition of her husband, and admits to eating comfort herself.   She does not drink alcohol.   Family History  Problem Relation Age of Onset  . Cancer Mother     liver  . Heart disease Father   . Cancer Father     colon  . Arrhythmia Brother     Atrial Fibrillation  . Arrhythmia Paternal Aunt     Atrial Fibrillation    Wt Readings from Last 3 Encounters:  05/20/15 219 lb 12.8 oz (99.701 kg)  04/02/15 224 lb (101.606 kg)  10/29/14 222 lb (100.699 kg)  -- trying to loose weight ; going back to Lap Band MD  PHYSICAL EXAM BP 130/84 mmHg  Pulse 71  Ht 5\' 1"  (1.549 m)  Wt 219 lb 12.8 oz (99.701 kg)  BMI 41.55 kg/m2 General appearance: alert, cooperative, appears stated age, no distress and moderately obese HEENT: [place]/AT, EOMI, MMM, anicteric sclera Neck: no adenopathy, no carotid bruit, no JVD and supple, symmetrical, trachea midline Lungs: clear to auscultation bilaterally, normal percussion bilaterally and Nonlabored, good air  movement Heart: RRR, normal S1 and S2.. She does have a somewhat lateralized PMI & sustained. soft S4 gallop but no S3. His also an 2/6 SEM radiating to the carotids. It is also soft HSM that goes toward the apex. Abdomen: Moderate obesity but otherwise soft, NT/ND./NA BS. No HJR Extremities: extremities normal, atraumatic, no cyanosis or edema Pulses: ~1+ bilateral extremity pulses on exam. She does have mild faint femoral bruits Neurologic: Grossly normal   Adult ECG Report  Rate: 71 ;  Rhythm: normal sinus rhythm, sinus arrhythmia and LVH with repolarization abnormality. Otherwise normal axis, intervals and durations.;   Narrative Interpretation: stable EKG  Other studies Reviewed: Additional studies/ records that were reviewed today include:  Recent Labs:  No new labs available   ASSESSMENT / PLAN: Problem List Items Addressed This Visit    Stenosis of right carotid artery without infarction (Chronic)    Can probably err on the side of every other year      Relevant Medications   metoprolol tartrate (LOPRESSOR) 25 MG tablet   ST elevation myocardial infarction (STEMI) of inferoposterior wall; H/o -- Rescue PTCA of RCA -- referred for CABG (Chronic)    No significant anginal symptoms or heart failure symptoms.  Last catheterization was in January  2015 with PCI.      Relevant Medications   metoprolol tartrate (LOPRESSOR) 25 MG tablet   S/P CABG (coronary artery bypass graft) x 3 (Chronic)   PAF (paroxysmal atrial fibrillation) (HCC) (Chronic)    As far as I can tell, no recurrent A. fib episodes. Because of GI concerns and frequent GI procedures, she is not on full anticoagulation. Simply on Plavix. With no recurrence, I think were okay with Plavix alone. Would potentially consider restarting ELIQUIS if she has any recurrent episodes.      Relevant Medications   metoprolol tartrate (LOPRESSOR) 25 MG tablet   Other Relevant Orders   EKG 12-Lead (Completed)   Morbid obesity -  s/p Lap Band 9/'05 (Chronic)    She may need to have her band tightened. Every time she has adjusted sheet again to lose weight and has a hard time controlling otherwise.      Relevant Medications   glimepiride (AMARYL) 4 MG tablet   Hypertension associated with diabetes ([employer]) (Chronic)    Stable blood pressure on current dose of beta blocker. Is actually taking less metoprolol and she previously been taking. On stable dose of diltiazem but not on ARB or ACE inhibitor. Blood pressure looks pretty good.      Relevant Medications   glimepiride (AMARYL) 4 MG tablet   metoprolol tartrate (LOPRESSOR) 25 MG tablet   Other Relevant Orders   EKG 12-Lead (Completed)   Dyslipidemia, goal LDL below 70 (Chronic)    I don't have current labs. She is on Crestor. Being monitored by her PCP/nephrologist      Relevant Medications   metoprolol tartrate (LOPRESSOR) 25 MG tablet   Other Relevant Orders   EKG 12-Lead (Completed)   CAD S/P percutaneous coronary angioplasty - PCI x 5 to SVG-D1 - Primary (Chronic)    No recurrent anginal symptoms. Still on Plavix. No bleeding issues, therefore we'll continue simply with Plavix. She is on accommodation of calcium channel blocker and beta blocker with A. fib history - therefore not on ACE inhibitor/ARB.[name] Kitchen Also on statin. Blood pressure is relatively stable, therefore I would continue current regimen.      Relevant Medications   metoprolol tartrate (LOPRESSOR) 25 MG tablet   Other Relevant Orders   EKG 12-Lead (Completed)    Other Visit Diagnoses    Need for prophylactic vaccination and inoculation against influenza        Relevant Orders    EKG 12-Lead (Completed)    Flu Vaccine QUAD 36+ mos IM (Completed)       Current medicines are reviewed at length with the patient today. (+/- concerns)  none The following changes have been made: none Studies Ordered:   Orders Placed This Encounter  Procedures  . Flu Vaccine QUAD 36+ mos IM  . EKG 12-Lead       [name], M.D., M.S. Interventional Cardiologist   Pager # [phone]

## 2015-05-20 NOTE — Patient Instructions (Signed)
NO CHANGE IN CURRENT MEDICATIONS  Your physician recommends that you schedule a follow-up appointment in [place].

## 2015-05-27 ENCOUNTER — Encounter: Payer: Self-pay | Admitting: Cardiology

## 2015-05-27 NOTE — Assessment & Plan Note (Signed)
Can probably err on the side of every other year

## 2015-05-27 NOTE — Assessment & Plan Note (Signed)
As far as I can tell, no recurrent A. fib episodes. Because of GI concerns and frequent GI procedures, she is not on full anticoagulation. Simply on Plavix. With no recurrence, I think were okay with Plavix alone. Would potentially consider restarting ELIQUIS if she has any recurrent episodes.

## 2015-05-27 NOTE — Assessment & Plan Note (Addendum)
No recurrent anginal symptoms. Still on Plavix. No bleeding issues, therefore we'll continue simply with Plavix. She is on accommodation of calcium channel blocker and beta blocker with A. fib history - therefore not on ACE inhibitor/ARB.[name] Kitchen Also on statin. Blood pressure is relatively stable, therefore I would continue current regimen.

## 2015-05-27 NOTE — Assessment & Plan Note (Signed)
No significant anginal symptoms or heart failure symptoms.  Last catheterization was in January 2015 with PCI.

## 2015-05-27 NOTE — Assessment & Plan Note (Signed)
She may need to have her band tightened. Every time she has adjusted sheet again to lose weight and has a hard time controlling otherwise.

## 2015-05-27 NOTE — Assessment & Plan Note (Signed)
I don't have current labs. She is on Crestor. Being monitored by her PCP/nephrologist

## 2015-05-27 NOTE — Assessment & Plan Note (Signed)
Stable blood pressure on current dose of beta blocker. Is actually taking less metoprolol and she previously been taking. On stable dose of diltiazem but not on ARB or ACE inhibitor. Blood pressure looks pretty good.

## 2015-06-04 ENCOUNTER — Ambulatory Visit: Payer: Medicare Other | Admitting: Emergency Medicine

## 2015-06-27 DIAGNOSIS — Z4651 Encounter for fitting and adjustment of gastric lap band: Secondary | ICD-10-CM | POA: Diagnosis not present

## 2015-07-01 DIAGNOSIS — J040 Acute laryngitis: Secondary | ICD-10-CM | POA: Diagnosis not present

## 2015-07-01 DIAGNOSIS — J320 Chronic maxillary sinusitis: Secondary | ICD-10-CM | POA: Diagnosis not present

## 2015-07-01 DIAGNOSIS — J322 Chronic ethmoidal sinusitis: Secondary | ICD-10-CM | POA: Diagnosis not present

## 2015-07-09 DIAGNOSIS — G8929 Other chronic pain: Secondary | ICD-10-CM | POA: Diagnosis not present

## 2015-07-12 IMAGING — DX DG RIBS W/ CHEST 3+V*L*
2 series · 3 of 3 positions shown · non-contrast
Comparison: PA and lateral chest x-ray October 16, 2013

CLINICAL DATA: Direct trauma to the left chest in a kinking injury
3 days ago now with persistent left lower anterior rib discomfort;
the patient has a cardiac monitor ; history of COPD

EXAM:
LEFT RIBS AND CHEST - 3+ VIEW

[chest]
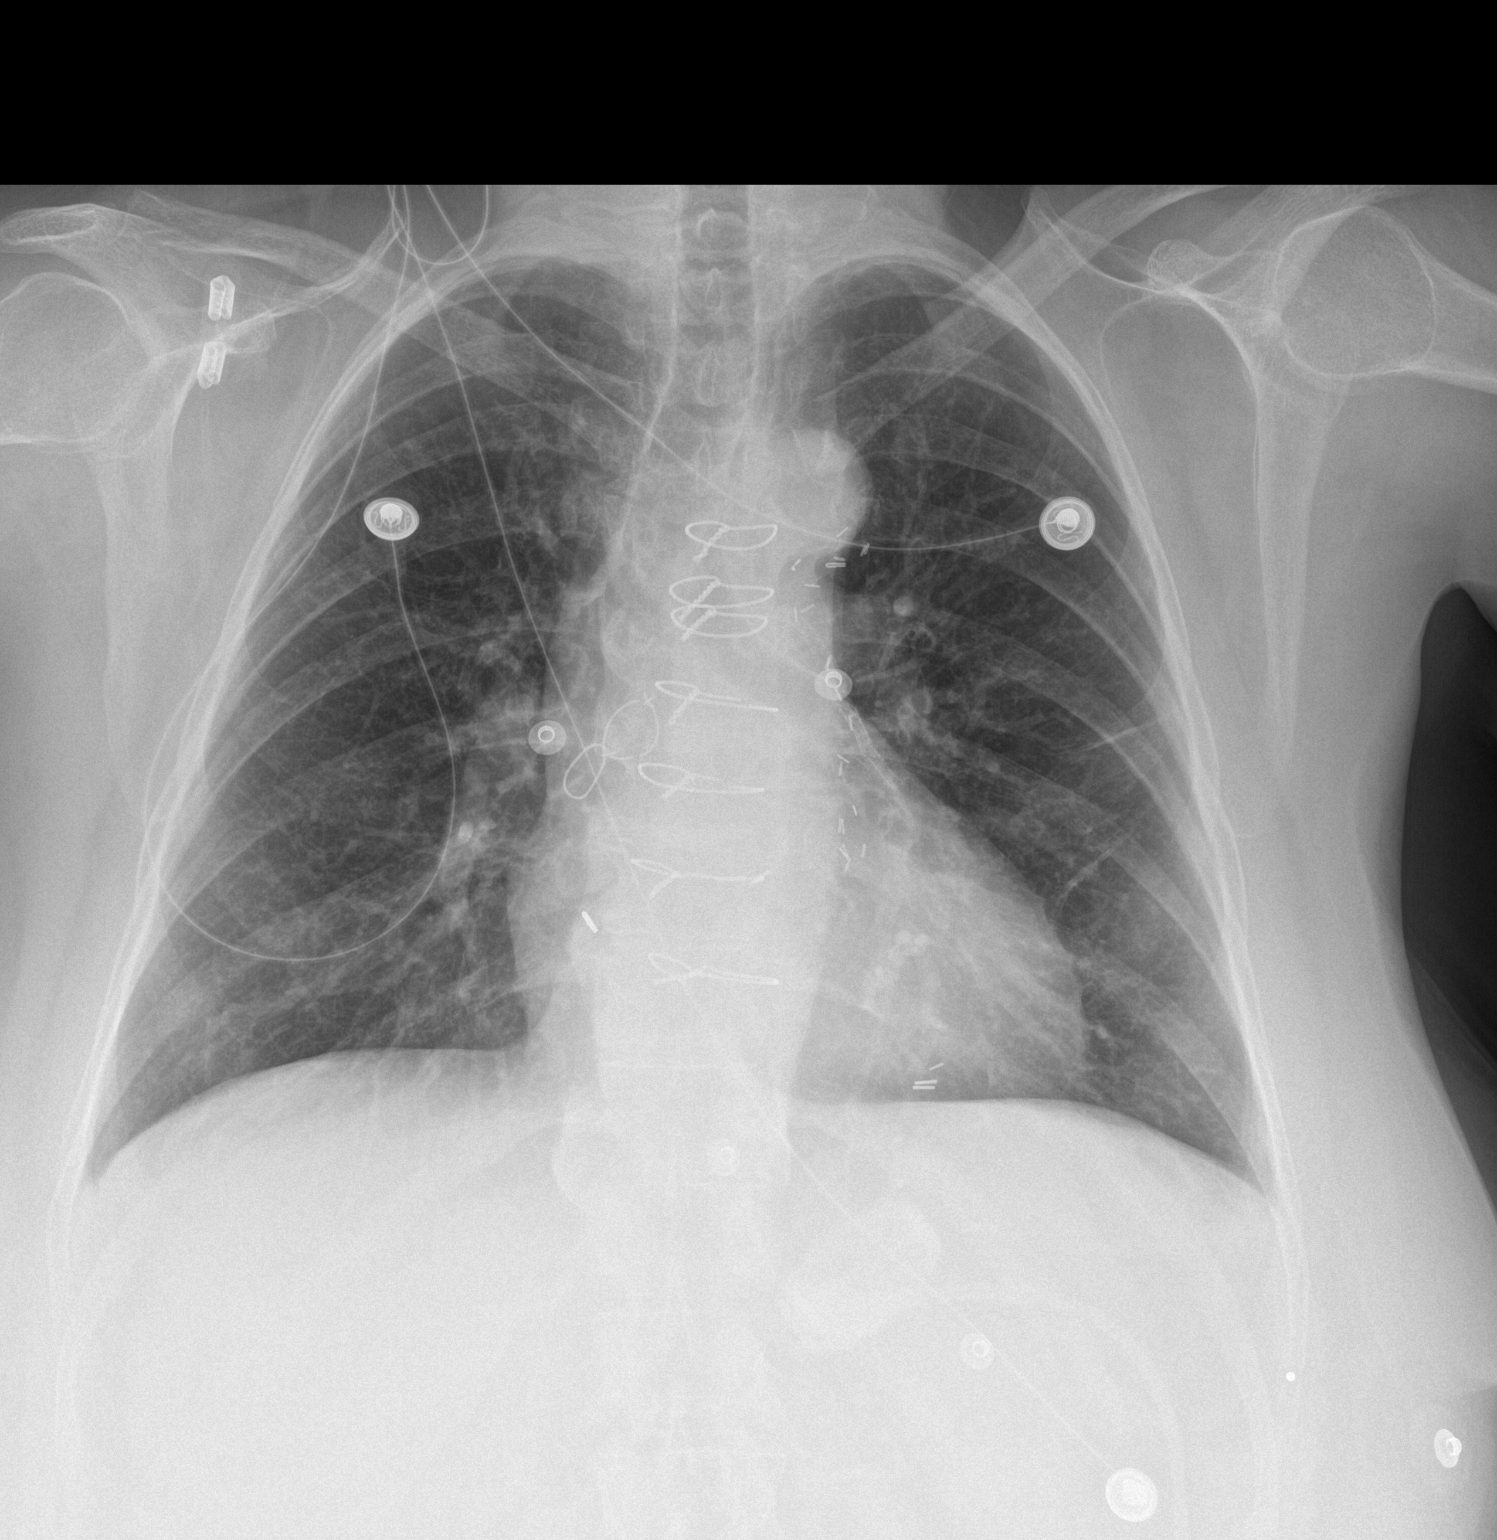

[Series 2: rib · 0.14mm/px · 2 of 2 slices shown]
[im 1/2]
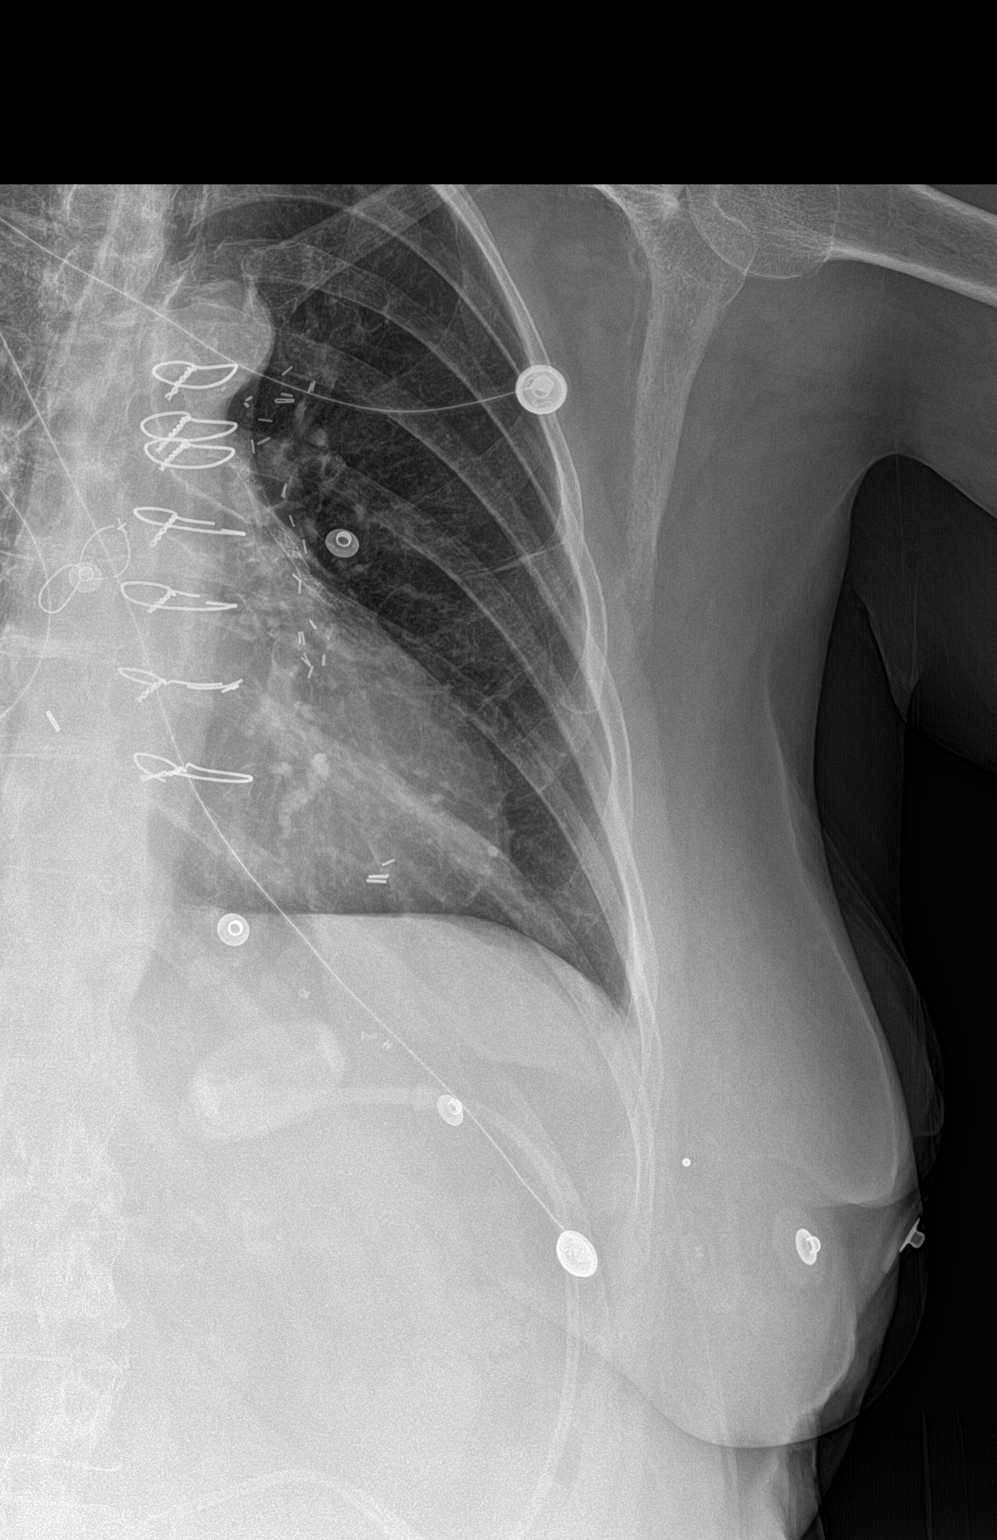
[im 2/2]
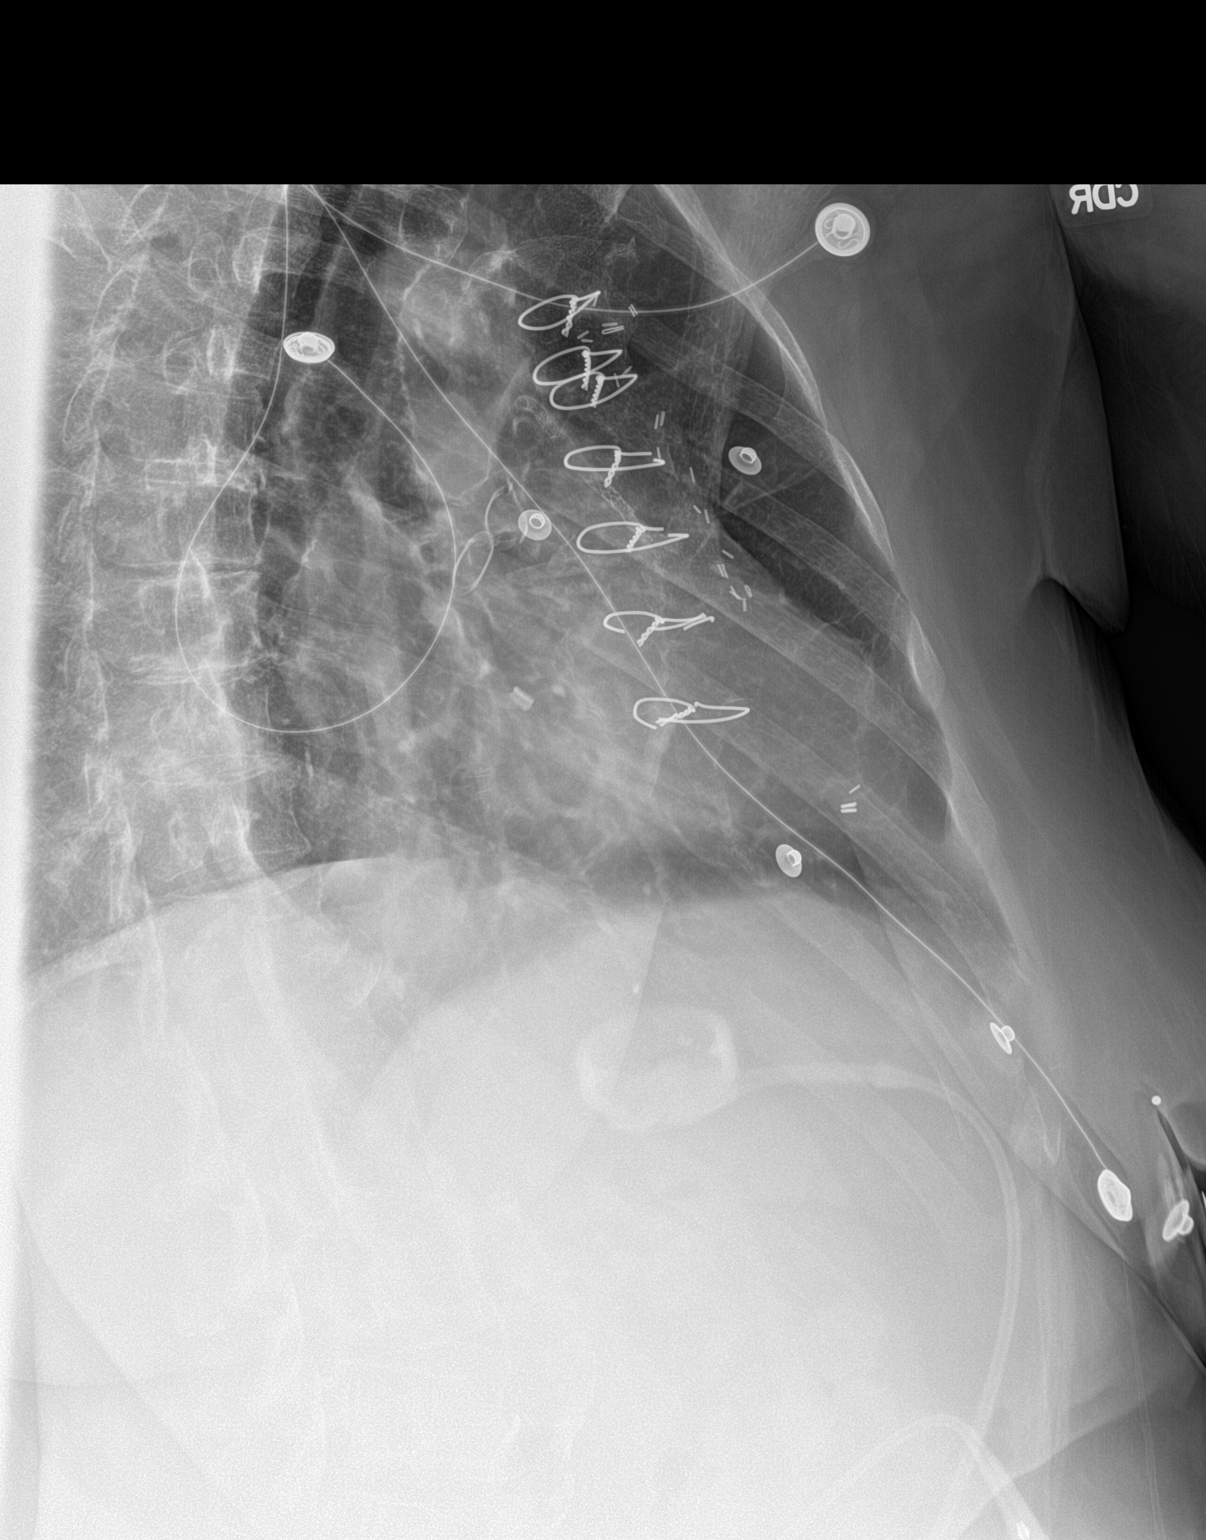

[3 of 3 positions shown; findings below may reference images not displayed]

FINDINGS: The lungs are adequately inflated. The interstitial markings are
coarse but stable. The heart normal in size. The patient has
undergone previous CABG. Three of the mid to lower sternal wires are
broken. There are coronary artery bypass graft markers. The
pulmonary vascularity is not engorged. There is no pleural effusion
or pneumothorax. There is an inflatable ring at the level of the GE
junction.

The ribs are reasonably well mineralized. No acute displaced
fracture is demonstrated.
IMPRESSION: 1. There is no acute displaced rib fracture. There is no evidence of
a pulmonary contusion, pneumothorax, or pleural effusion.
2. COPD and post CABG changes. There is no evidence of CHF nor
pneumonia.

## 2015-07-15 DIAGNOSIS — J040 Acute laryngitis: Secondary | ICD-10-CM | POA: Diagnosis not present

## 2015-07-15 DIAGNOSIS — J343 Hypertrophy of nasal turbinates: Secondary | ICD-10-CM | POA: Diagnosis not present

## 2015-07-15 DIAGNOSIS — J342 Deviated nasal septum: Secondary | ICD-10-CM | POA: Diagnosis not present

## 2015-07-16 DIAGNOSIS — Z940 Kidney transplant status: Secondary | ICD-10-CM | POA: Diagnosis not present

## 2015-07-16 DIAGNOSIS — I639 Cerebral infarction, unspecified: Secondary | ICD-10-CM | POA: Diagnosis not present

## 2015-07-16 DIAGNOSIS — I2510 Atherosclerotic heart disease of native coronary artery without angina pectoris: Secondary | ICD-10-CM | POA: Diagnosis not present

## 2015-07-16 DIAGNOSIS — D631 Anemia in chronic kidney disease: Secondary | ICD-10-CM | POA: Diagnosis not present

## 2015-07-16 DIAGNOSIS — I739 Peripheral vascular disease, unspecified: Secondary | ICD-10-CM | POA: Diagnosis not present

## 2015-07-16 DIAGNOSIS — N182 Chronic kidney disease, stage 2 (mild): Secondary | ICD-10-CM | POA: Diagnosis not present

## 2015-07-16 DIAGNOSIS — I129 Hypertensive chronic kidney disease with stage 1 through stage 4 chronic kidney disease, or unspecified chronic kidney disease: Secondary | ICD-10-CM | POA: Diagnosis not present

## 2015-07-16 DIAGNOSIS — I4891 Unspecified atrial fibrillation: Secondary | ICD-10-CM | POA: Diagnosis not present

## 2015-07-16 DIAGNOSIS — E785 Hyperlipidemia, unspecified: Secondary | ICD-10-CM | POA: Diagnosis not present

## 2015-07-16 DIAGNOSIS — E118 Type 2 diabetes mellitus with unspecified complications: Secondary | ICD-10-CM | POA: Diagnosis not present

## 2015-07-16 DIAGNOSIS — N189 Chronic kidney disease, unspecified: Secondary | ICD-10-CM | POA: Diagnosis not present

## 2015-07-16 DIAGNOSIS — N2581 Secondary hyperparathyroidism of renal origin: Secondary | ICD-10-CM | POA: Diagnosis not present

## 2015-07-16 DIAGNOSIS — E669 Obesity, unspecified: Secondary | ICD-10-CM | POA: Diagnosis not present

## 2015-07-25 DIAGNOSIS — R319 Hematuria, unspecified: Secondary | ICD-10-CM | POA: Diagnosis not present

## 2015-07-31 DIAGNOSIS — M25511 Pain in right shoulder: Secondary | ICD-10-CM | POA: Diagnosis not present

## 2015-07-31 DIAGNOSIS — M5136 Other intervertebral disc degeneration, lumbar region: Secondary | ICD-10-CM | POA: Diagnosis not present

## 2015-08-13 ENCOUNTER — Other Ambulatory Visit: Payer: Self-pay | Admitting: Cardiology

## 2015-08-13 NOTE — Telephone Encounter (Signed)
Rx request sent to pharmacy.  

## 2015-08-21 ENCOUNTER — Other Ambulatory Visit: Payer: Self-pay | Admitting: Cardiology

## 2015-08-21 NOTE — Telephone Encounter (Signed)
Rx request sent to pharmacy.  

## 2015-09-16 ENCOUNTER — Other Ambulatory Visit: Payer: Self-pay | Admitting: Emergency Medicine

## 2015-09-17 ENCOUNTER — Other Ambulatory Visit: Payer: Self-pay | Admitting: Emergency Medicine

## 2015-10-15 DIAGNOSIS — I129 Hypertensive chronic kidney disease with stage 1 through stage 4 chronic kidney disease, or unspecified chronic kidney disease: Secondary | ICD-10-CM | POA: Diagnosis not present

## 2015-10-15 DIAGNOSIS — I2510 Atherosclerotic heart disease of native coronary artery without angina pectoris: Secondary | ICD-10-CM | POA: Diagnosis not present

## 2015-10-15 DIAGNOSIS — Z940 Kidney transplant status: Secondary | ICD-10-CM | POA: Diagnosis not present

## 2015-10-15 DIAGNOSIS — I639 Cerebral infarction, unspecified: Secondary | ICD-10-CM | POA: Diagnosis not present

## 2015-10-15 DIAGNOSIS — I739 Peripheral vascular disease, unspecified: Secondary | ICD-10-CM | POA: Diagnosis not present

## 2015-10-15 DIAGNOSIS — I4891 Unspecified atrial fibrillation: Secondary | ICD-10-CM | POA: Diagnosis not present

## 2015-10-15 DIAGNOSIS — D631 Anemia in chronic kidney disease: Secondary | ICD-10-CM | POA: Diagnosis not present

## 2015-10-15 DIAGNOSIS — E118 Type 2 diabetes mellitus with unspecified complications: Secondary | ICD-10-CM | POA: Diagnosis not present

## 2015-10-15 DIAGNOSIS — E785 Hyperlipidemia, unspecified: Secondary | ICD-10-CM | POA: Diagnosis not present

## 2015-10-15 DIAGNOSIS — N182 Chronic kidney disease, stage 2 (mild): Secondary | ICD-10-CM | POA: Diagnosis not present

## 2015-10-15 DIAGNOSIS — E669 Obesity, unspecified: Secondary | ICD-10-CM | POA: Diagnosis not present

## 2015-10-15 DIAGNOSIS — N2581 Secondary hyperparathyroidism of renal origin: Secondary | ICD-10-CM | POA: Diagnosis not present

## 2015-10-16 ENCOUNTER — Other Ambulatory Visit: Payer: Self-pay | Admitting: Emergency Medicine

## 2015-10-24 ENCOUNTER — Telehealth: Payer: Self-pay | Admitting: Emergency Medicine

## 2015-10-24 ENCOUNTER — Telehealth: Payer: Self-pay | Admitting: Cardiology

## 2015-10-24 DIAGNOSIS — J45909 Unspecified asthma, uncomplicated: Secondary | ICD-10-CM

## 2015-10-24 NOTE — Telephone Encounter (Signed)
I suspect she will need a repeat ONO on RA in order to qualify for a new concentrator. Katherine [name] with me to order this and start process to change to [facility]

## 2015-10-24 NOTE — Telephone Encounter (Signed)
I agree with Pulmonologist writing for this  [facility]

## 2015-10-24 NOTE — Telephone Encounter (Signed)
Called and spoke to pt. Informed her of the recs per RB. Order placed for ONO and referral to [facility]. Pt verbalized understanding and states she needs her O2 at night while waiting on the ONO. Pt states she will call [facility] in the morning to see if they can supply her O2 to last her till ONO. Nothing further needed at this time.

## 2015-10-24 NOTE — Telephone Encounter (Signed)
Pt needing new O2 concentrator - the one she has used is very old and has stopped working. She purchased through [place].  Pt had been using [facility], she wants to use [facility] for new DME provider.  Routed to Dr. Ellyn Hack for [place].

## 2015-10-24 NOTE — Telephone Encounter (Signed)
Spoke with the pt  She states her o2 concentrator stopped working  She bought the machine from [place] aprox 15 yrs ago  She now wants to go through [facility]  She was started on 2lpm o2 with sleep by Dr Patsey Berthold  Please advise if okay to send new order thanks

## 2015-10-24 NOTE — Telephone Encounter (Signed)
Pt says her oxygen concentrator have died, she needs a new prescription to get another one.

## 2015-10-24 NOTE — Telephone Encounter (Signed)
Informed patient - to contact primary or pulmonologist  For the new order [name] dose not follow. Patient states Dr [name] started the order.  RN informed patient - her pulmonologist to order new oxygen concentrator Patient verbalized understading

## 2015-10-25 ENCOUNTER — Telehealth: Payer: Self-pay | Admitting: Emergency Medicine

## 2015-10-25 DIAGNOSIS — G4733 Obstructive sleep apnea (adult) (pediatric): Secondary | ICD-10-CM

## 2015-10-25 NOTE — Telephone Encounter (Signed)
Patient says that she spoke with one of the nurses yesterday.  She says that her O2 Concentrator has stopped working.  She called [facility] this morning, she said that [facility] just needs new prescription for concentrator.  Uses at night only for sleep on 2L.    Prescription sent to [place]. Patient aware. Nothing further needed.

## 2015-10-28 ENCOUNTER — Telehealth: Payer: Self-pay | Admitting: Emergency Medicine

## 2015-10-28 NOTE — Telephone Encounter (Signed)
Spoke with patient- states that her DME [facility]) is no longer serving her area and she is needing to switch to [facility].  Pt needs Rx for new concentrator- 2L O2 qhs - sent to [facility].   Please advise Dr Lamonte Sakai. Thanks.

## 2015-10-28 NOTE — Telephone Encounter (Signed)
OK to send. Hopefully we can use her existing ONO. If nbot then please order a new one.

## 2015-10-29 NOTE — Telephone Encounter (Signed)
Patient lost a significant amount of weight and her sleep apnea resolved. She does still have residual nocturnal hypoxemia and this should be her official diagnosis. She likely needs a repeat overnight oximetry which we should order.

## 2015-10-29 NOTE — Telephone Encounter (Signed)
Spoke with Melissa with [facility].  She checked pt's insurance (Medicare and Medicaid) and due to pt having a dx of OSA pt will need a in lab cpap titration study with oxygen titration to prove that she stills needs the oxygen.  According to pt she has not used cpap in the past 6 years and does not have a machine at this time.  Per Melissa Dr Lamonte Sakai would have to document that pt no longer has OSA if sleep study is not done.  Pt is aware that we are checking on this.  Dr Lamonte Sakai, please advise on next step.

## 2015-10-29 NOTE — Telephone Encounter (Signed)
[facility], [place], called and needs to talk about switching from [place] to [facility].

## 2015-10-30 NOTE — Telephone Encounter (Signed)
Spoke with the pt  I have made her aware of recs per RB  She verbalized understanding  ONO already ordered and she is already being set up for this

## 2015-10-30 NOTE — Telephone Encounter (Signed)
LMTCB

## 2015-11-05 DIAGNOSIS — J4550 Severe persistent asthma, uncomplicated: Secondary | ICD-10-CM | POA: Diagnosis not present

## 2015-11-05 IMAGING — CR DG CHEST 2V
2 series · 2 of 2 positions shown · non-contrast
Comparison: None.

CLINICAL DATA: Cough.  Congestion.

EXAM:
CHEST  2 VIEW

[w chest pa]
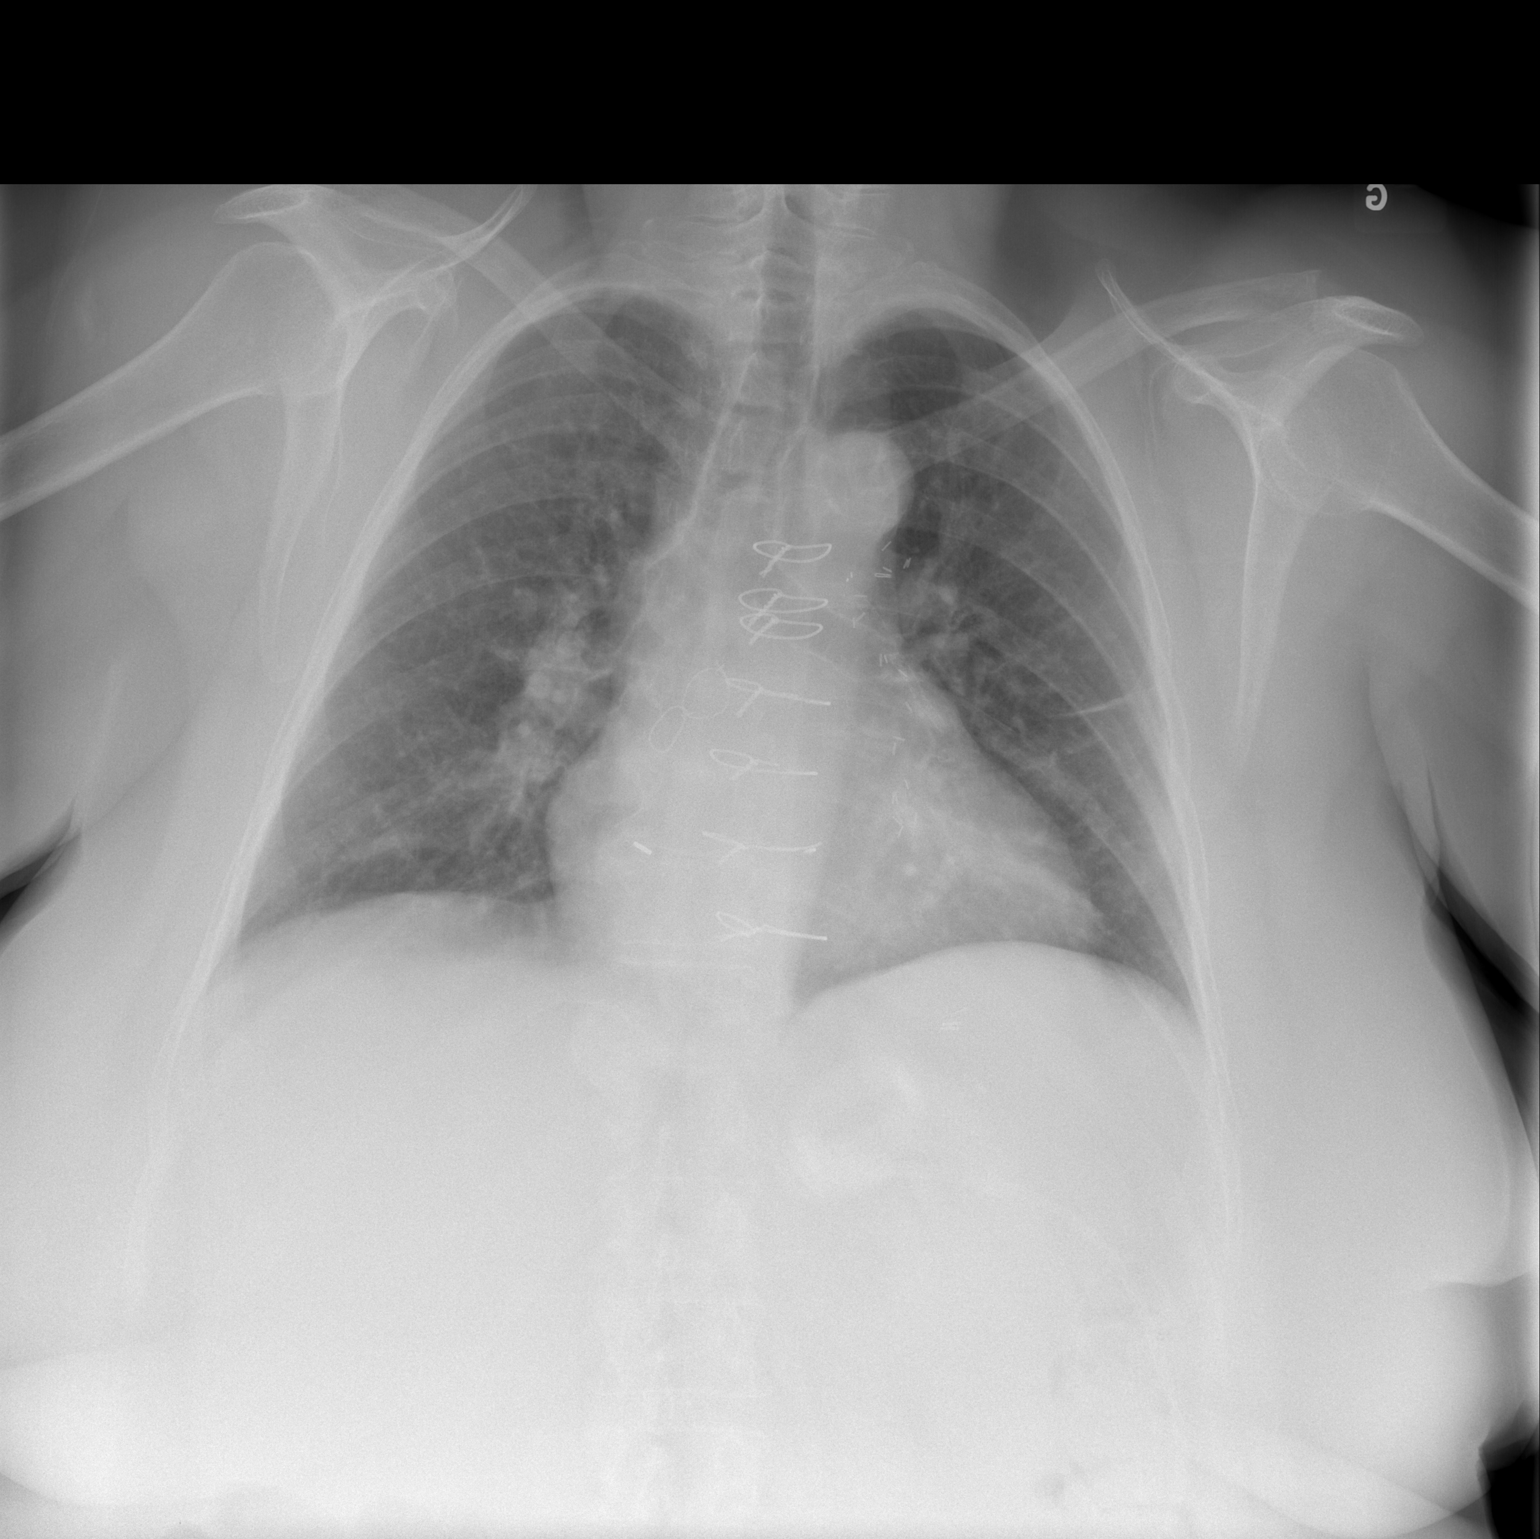

[w chest lat]
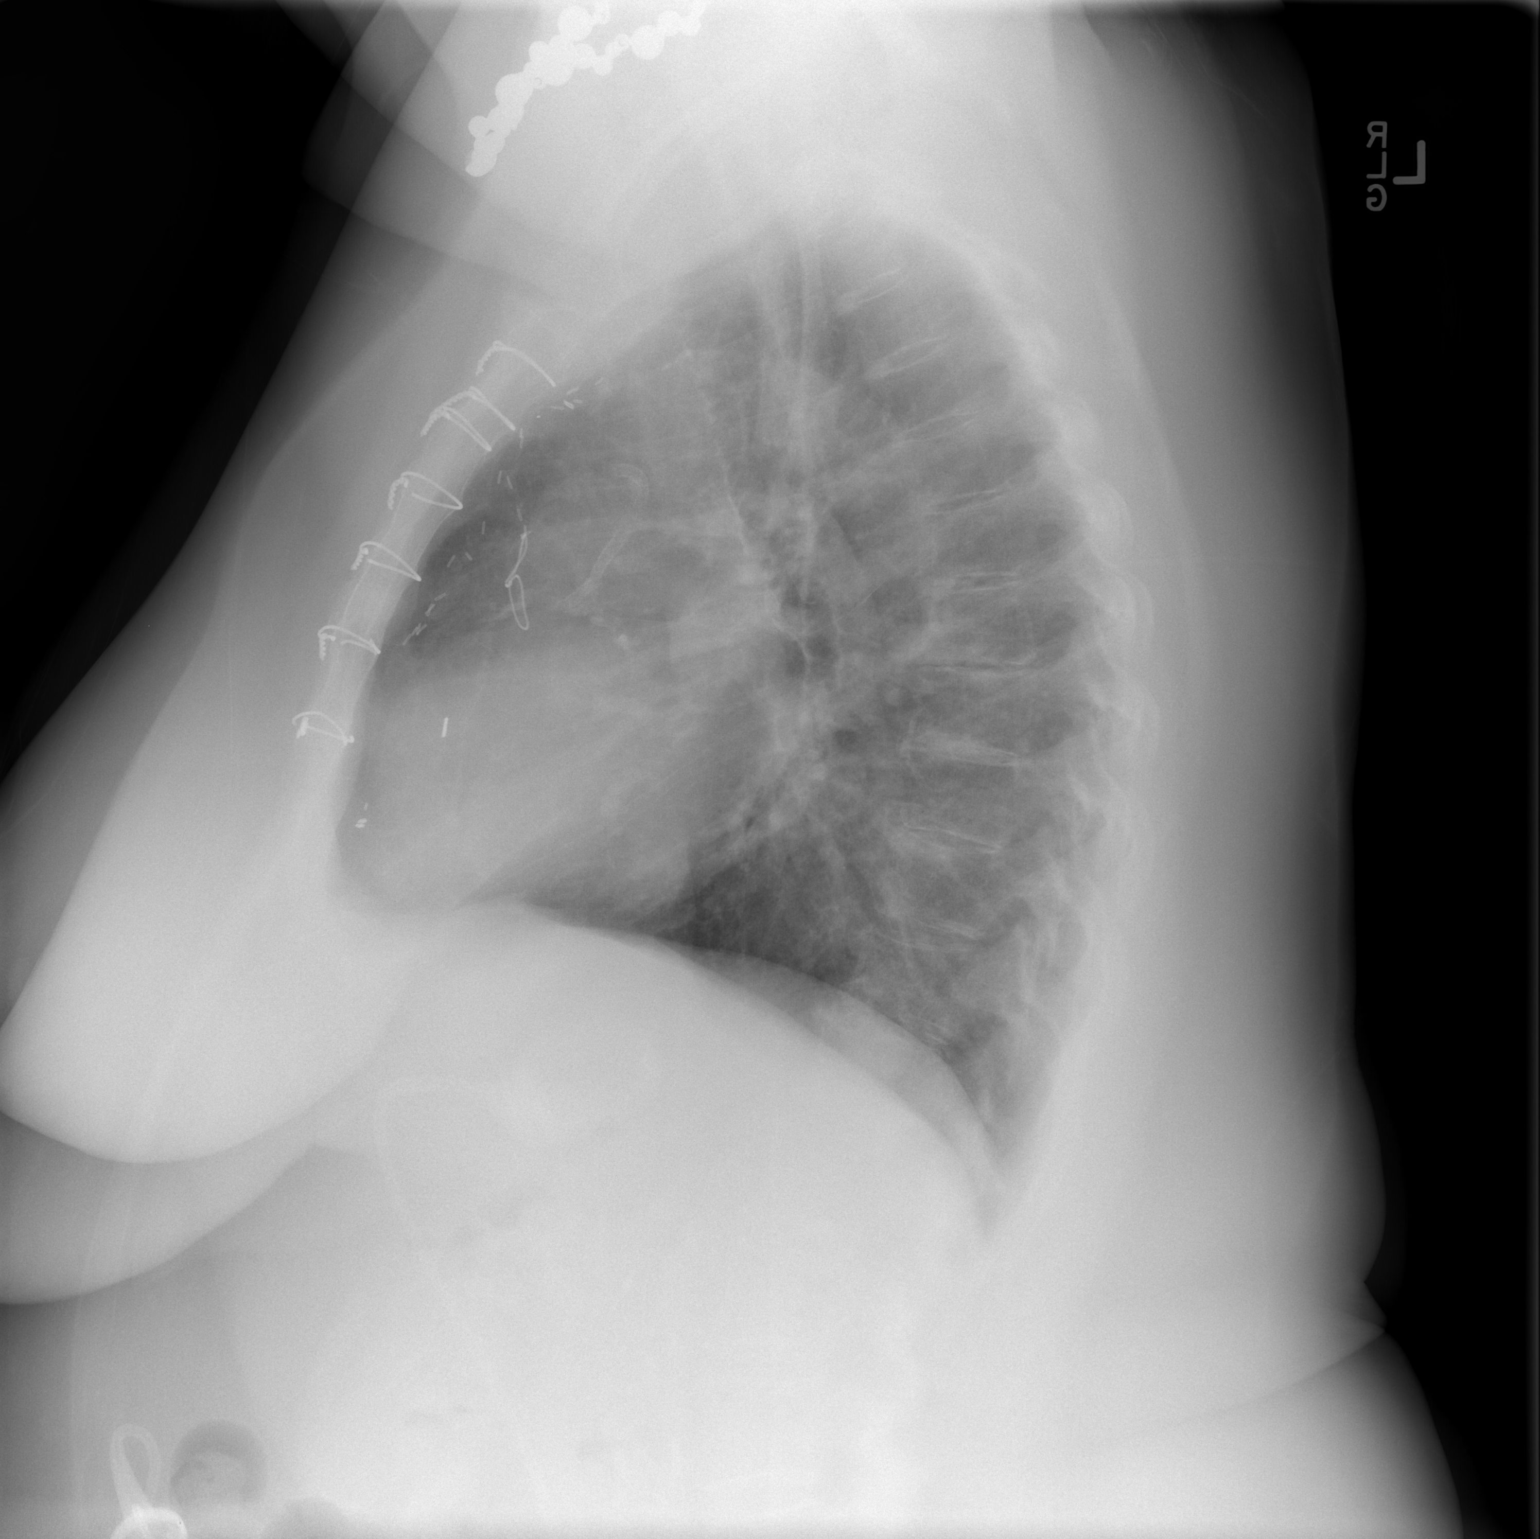

[2 of 2 positions shown; findings below may reference images not displayed]

FINDINGS: Mediastinum and hilar structures are normal. Lungs are clear. Left
mid lung field subsegmental atelectasis and/or scarring. Prior CABG.
Cardiomegaly. Lap band noted in good anatomic position.
IMPRESSION: 1. Left mid lung field subsegmental atelectasis and/or scarring.
2. Prior CABG.  No pulmonary venous congestion.

## 2015-11-12 ENCOUNTER — Encounter: Payer: Medicare Other | Admitting: Physical Medicine & Rehabilitation

## 2015-11-12 IMAGING — RF DG CHOLANGIOGRAM OPERATIVE
1 series · 4 of 4 positions shown · non-contrast
Comparison: CT of the abdomen on 07/28/2014

CLINICAL DATA: Cholecystectomy for symptomatic cholelithiasis.

EXAM:
INTRAOPERATIVE CHOLANGIOGRAM
TECHNIQUE: Cholangiographic images from the C-arm fluoroscopic device were
submitted for interpretation post-operatively. Please see the
procedural report for the amount of contrast and the fluoroscopy
time utilized.

[Series 1: run · 4 of 117 frames shown]
[frame 11/117]
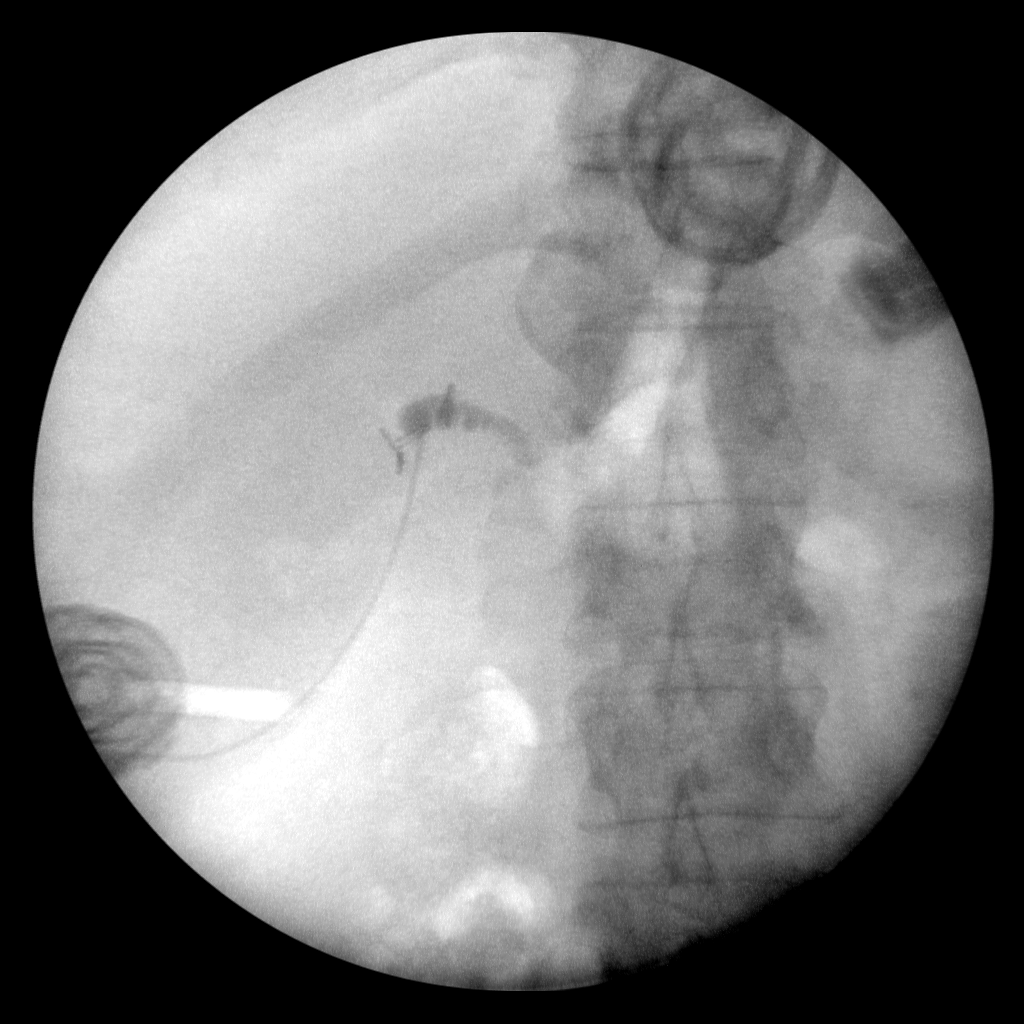
[frame 18/117]
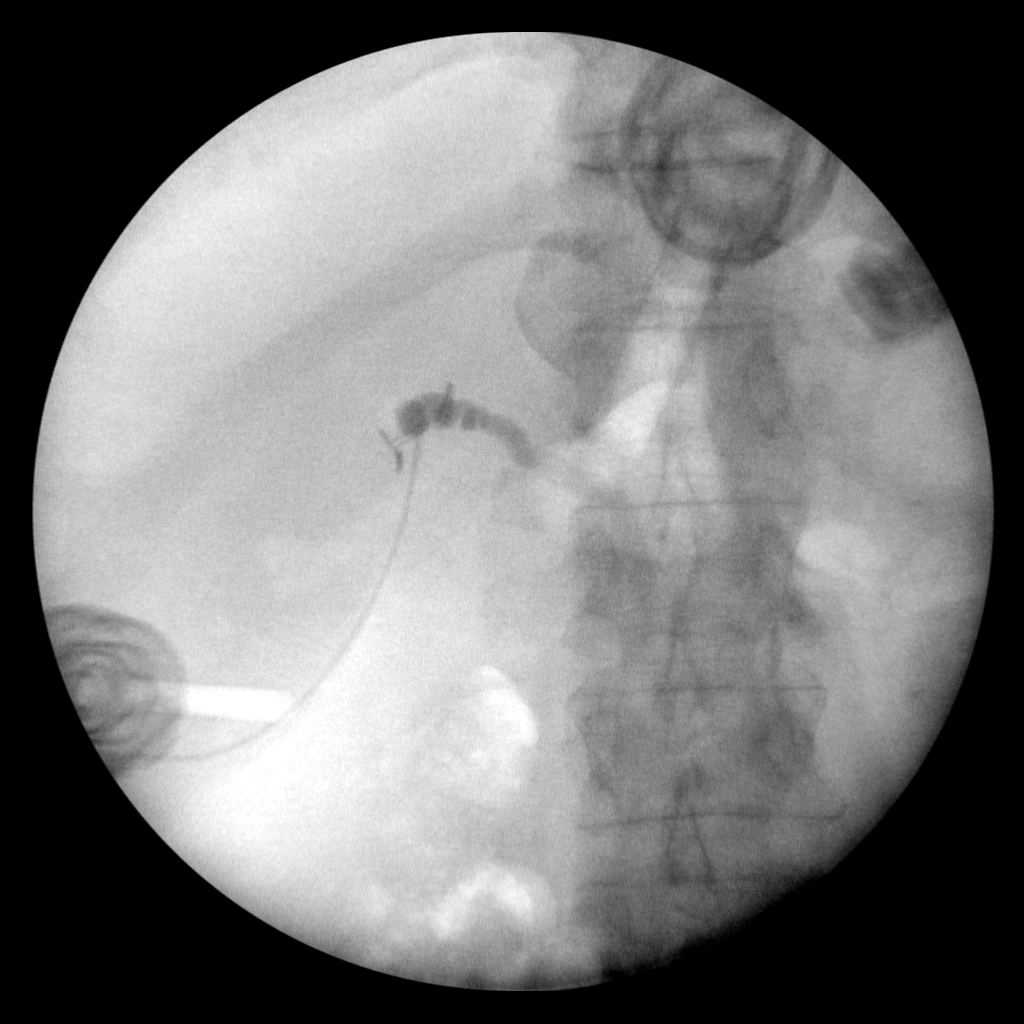
[frame 59/117]
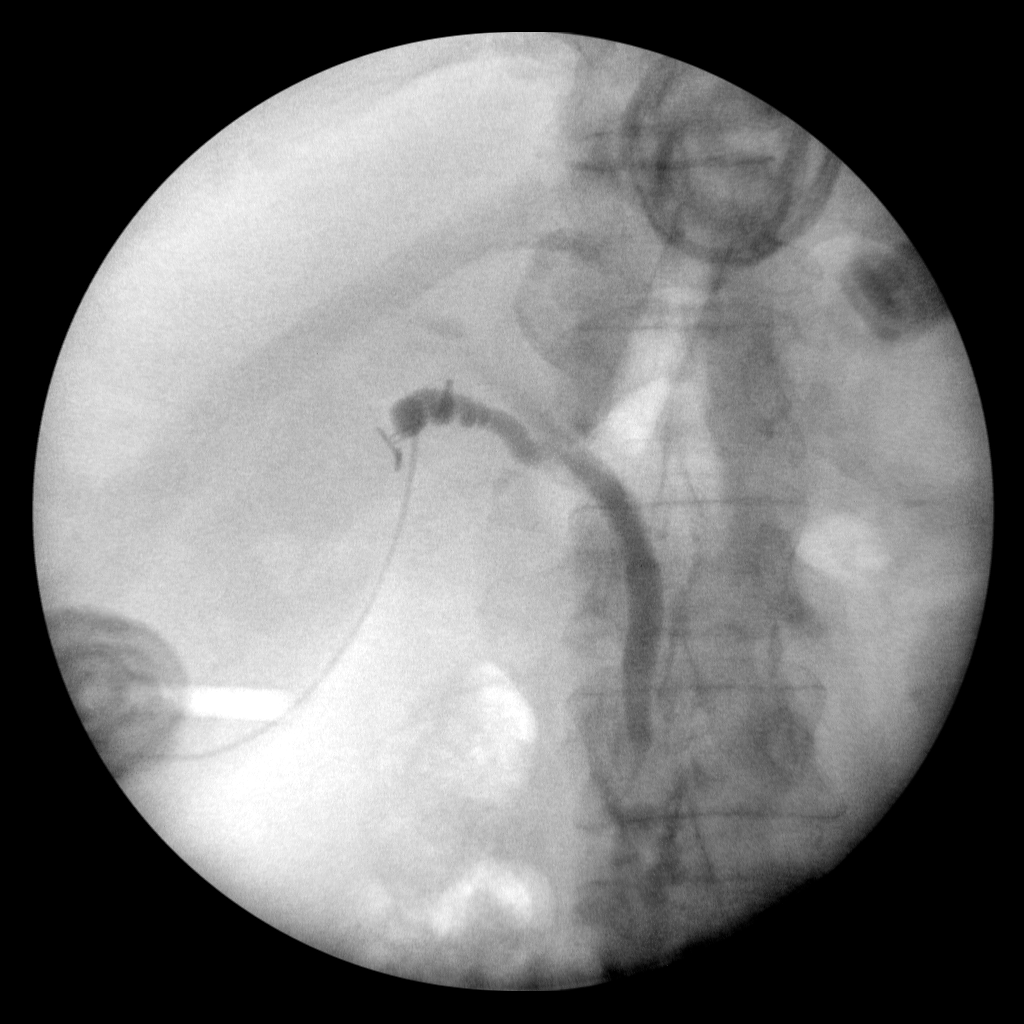
[frame 100/117]
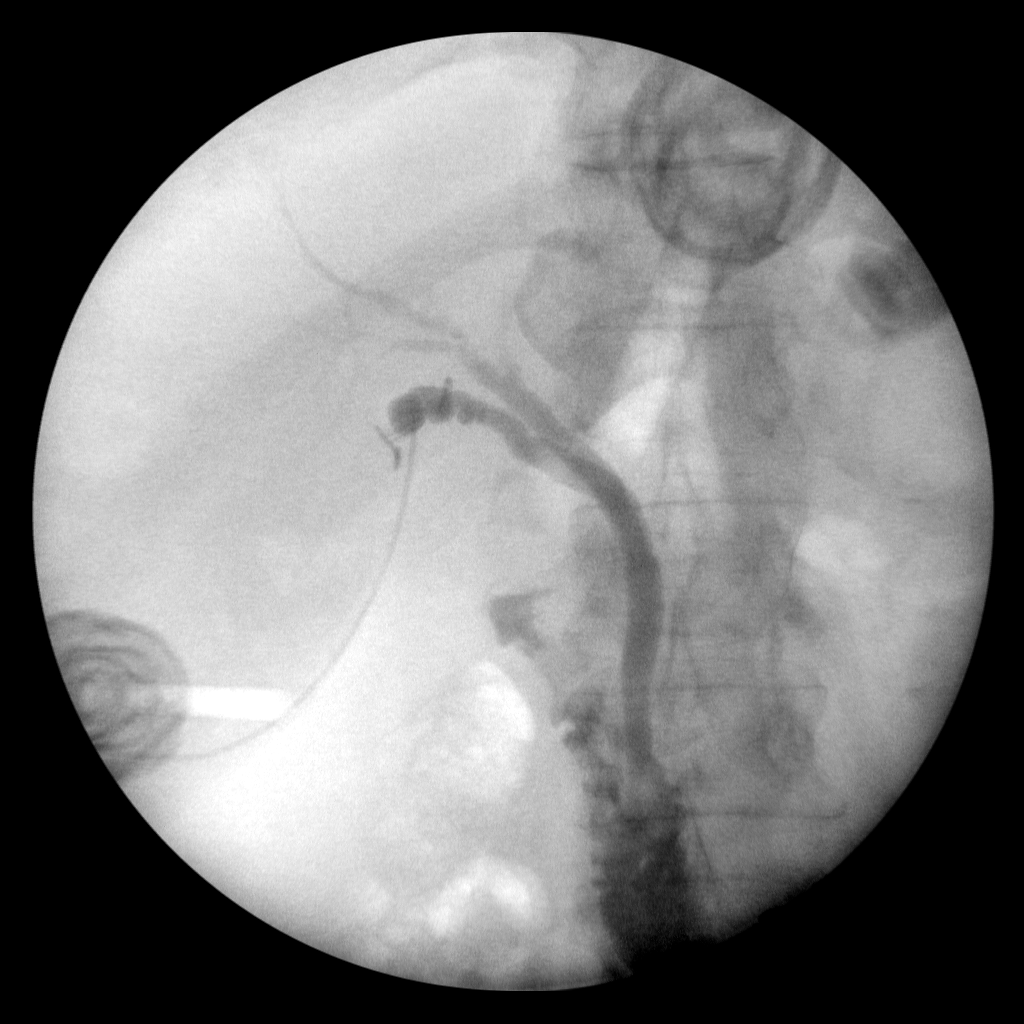

[4 of 4 positions shown; findings below may reference images not displayed]

FINDINGS: Intraoperative cholangiogram shows a normal opacified biliary tree
without evidence of obstruction or filling defect. The common bile
duct is well visualized. Contrast enters the duodenum normally. No
contrast extravasation identified.
IMPRESSION: Normal intraoperative cholangiogram.

## 2015-11-18 ENCOUNTER — Telehealth: Payer: Self-pay | Admitting: Emergency Medicine

## 2015-11-18 DIAGNOSIS — J45909 Unspecified asthma, uncomplicated: Secondary | ICD-10-CM

## 2015-11-18 NOTE — Telephone Encounter (Signed)
Spoke with pt, states she has concerns about her 02-requesting ONO results and recs.  I see no documentation of ONO in pt's chart. Called [facility], this is being faxed to our office at [facility] attn.  Will await fax.

## 2015-11-20 NOTE — Telephone Encounter (Signed)
ONO is in RB's lookat  Please advise on results, thanks

## 2015-11-25 ENCOUNTER — Encounter: Payer: Self-pay | Admitting: Emergency Medicine

## 2015-11-26 NOTE — Telephone Encounter (Signed)
Pt aware of results per RB, pt is not happy to d/c O2 Feels that she will not be able to sleep and will continue to have issues with her asthma without it.  Any further rec's?

## 2015-11-26 NOTE — Telephone Encounter (Signed)
Spoke with pt, requesting asap results of ONO.  Pt states she had her ONO almost 3 weeks ago and is upset that it is taking this long to get her results.  RB please advise on ONO results.  Thanks.

## 2015-11-26 NOTE — Telephone Encounter (Signed)
Patient is upset she has not heard back about this from [place] or [facility].  I told her Dr. Lamonte Sakai is working on this. States her asthma is worse without her O2 as she has not had it for almost 2 months now.  CB is [phone].

## 2015-11-26 NOTE — Telephone Encounter (Signed)
I looked for it last week, but could not find it, believe it was being scanned

## 2015-11-26 NOTE — Telephone Encounter (Signed)
I found it in the scanned data, had written results on it but pt wasn't called. Let her know that the overnight oximetry did not show any low oxygen levels on room air while sleeping. She could come off nocturnal oxygen.

## 2015-11-27 NOTE — Telephone Encounter (Signed)
She can continue it if she wants - that is, I will not d/c it from orders. She just needs to be made aware that insurance may make her pay for it out of pocket.

## 2015-11-27 NOTE — Telephone Encounter (Signed)
Spoke with pt. She is aware of the below response from [place]. Pt states that she can't afford to pay out of pocket for the oxygen. She is okay with [place] sending an order to [facility] to discontinue oxygen. Order has been placed. Nothing further was needed.

## 2015-12-02 ENCOUNTER — Encounter: Payer: Medicare Other | Admitting: Physical Medicine & Rehabilitation

## 2015-12-03 ENCOUNTER — Telehealth: Payer: Self-pay | Admitting: Emergency Medicine

## 2015-12-03 DIAGNOSIS — J45909 Unspecified asthma, uncomplicated: Secondary | ICD-10-CM

## 2015-12-03 NOTE — Telephone Encounter (Signed)
Spoke with Melissa.  She said that patient is still with [employer], not with [facility].  Orders need to be sent to [place]. Order for O2 was sent as DME: [facility], but was submitted to [facility] and [employer].  [facility] - please advise what needs to be done with this order.

## 2015-12-03 NOTE — Telephone Encounter (Signed)
New order entered. Nothing further needed.

## 2015-12-03 NOTE — Telephone Encounter (Signed)
Since the last order stated "[facility] >> discontinue oxygen use" we just need a new Order placed with [employer] and we can fax it to them.

## 2015-12-03 NOTE — Telephone Encounter (Signed)
Pt states she has a Engineer, civil (consulting) and big tank from [place] in her closets-states it probably over 58 years old and needs [employer] to come pick up the items.

## 2015-12-04 DIAGNOSIS — Z940 Kidney transplant status: Secondary | ICD-10-CM | POA: Diagnosis not present

## 2015-12-04 DIAGNOSIS — J322 Chronic ethmoidal sinusitis: Secondary | ICD-10-CM | POA: Diagnosis not present

## 2015-12-04 DIAGNOSIS — J342 Deviated nasal septum: Secondary | ICD-10-CM | POA: Diagnosis not present

## 2015-12-04 DIAGNOSIS — J320 Chronic maxillary sinusitis: Secondary | ICD-10-CM | POA: Diagnosis not present

## 2015-12-04 DIAGNOSIS — E118 Type 2 diabetes mellitus with unspecified complications: Secondary | ICD-10-CM | POA: Diagnosis not present

## 2015-12-04 DIAGNOSIS — N390 Urinary tract infection, site not specified: Secondary | ICD-10-CM | POA: Diagnosis not present

## 2015-12-04 DIAGNOSIS — I129 Hypertensive chronic kidney disease with stage 1 through stage 4 chronic kidney disease, or unspecified chronic kidney disease: Secondary | ICD-10-CM | POA: Diagnosis not present

## 2015-12-04 DIAGNOSIS — J370 Chronic laryngitis: Secondary | ICD-10-CM | POA: Diagnosis not present

## 2015-12-04 DIAGNOSIS — N189 Chronic kidney disease, unspecified: Secondary | ICD-10-CM | POA: Diagnosis not present

## 2015-12-04 NOTE — Telephone Encounter (Signed)
NEED AN ORDER TO HAVE [employer] PICK IT UP THANKS Joellen [place]

## 2015-12-04 NOTE — Telephone Encounter (Signed)
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 Nothing further needed.

## 2015-12-13 ENCOUNTER — Other Ambulatory Visit: Payer: Self-pay | Admitting: Emergency Medicine

## 2015-12-16 ENCOUNTER — Encounter: Payer: Medicare Other | Admitting: Physical Medicine & Rehabilitation

## 2016-01-03 DIAGNOSIS — I639 Cerebral infarction, unspecified: Secondary | ICD-10-CM | POA: Diagnosis not present

## 2016-01-03 DIAGNOSIS — I4891 Unspecified atrial fibrillation: Secondary | ICD-10-CM | POA: Diagnosis not present

## 2016-01-03 DIAGNOSIS — E785 Hyperlipidemia, unspecified: Secondary | ICD-10-CM | POA: Diagnosis not present

## 2016-01-03 DIAGNOSIS — N2581 Secondary hyperparathyroidism of renal origin: Secondary | ICD-10-CM | POA: Diagnosis not present

## 2016-01-03 DIAGNOSIS — I129 Hypertensive chronic kidney disease with stage 1 through stage 4 chronic kidney disease, or unspecified chronic kidney disease: Secondary | ICD-10-CM | POA: Diagnosis not present

## 2016-01-03 DIAGNOSIS — D631 Anemia in chronic kidney disease: Secondary | ICD-10-CM | POA: Diagnosis not present

## 2016-01-03 DIAGNOSIS — I739 Peripheral vascular disease, unspecified: Secondary | ICD-10-CM | POA: Diagnosis not present

## 2016-01-03 DIAGNOSIS — N182 Chronic kidney disease, stage 2 (mild): Secondary | ICD-10-CM | POA: Diagnosis not present

## 2016-01-03 DIAGNOSIS — E669 Obesity, unspecified: Secondary | ICD-10-CM | POA: Diagnosis not present

## 2016-01-03 DIAGNOSIS — I2510 Atherosclerotic heart disease of native coronary artery without angina pectoris: Secondary | ICD-10-CM | POA: Diagnosis not present

## 2016-01-03 DIAGNOSIS — Z940 Kidney transplant status: Secondary | ICD-10-CM | POA: Diagnosis not present

## 2016-01-03 DIAGNOSIS — E118 Type 2 diabetes mellitus with unspecified complications: Secondary | ICD-10-CM | POA: Diagnosis not present

## 2016-02-05 ENCOUNTER — Other Ambulatory Visit (INDEPENDENT_AMBULATORY_CARE_PROVIDER_SITE_OTHER): Payer: Medicare Other

## 2016-02-05 ENCOUNTER — Encounter: Payer: Self-pay | Admitting: Adult Health

## 2016-02-05 ENCOUNTER — Ambulatory Visit (INDEPENDENT_AMBULATORY_CARE_PROVIDER_SITE_OTHER)
Admission: RE | Admit: 2016-02-05 | Discharge: 2016-02-05 | Disposition: A | Discharge status: Home / Self Care | Payer: Medicare Other | Source: Ambulatory Visit | Attending: Adult Health | Admitting: Adult Health

## 2016-02-05 ENCOUNTER — Telehealth: Payer: Self-pay | Admitting: Adult Health

## 2016-02-05 ENCOUNTER — Ambulatory Visit (INDEPENDENT_AMBULATORY_CARE_PROVIDER_SITE_OTHER): Payer: Medicare Other | Admitting: Adult Health

## 2016-02-05 VITALS — BP 118/82 | HR 67 | Temp 98.1°F | Ht 61.0 in | Wt 198.0 lb

## 2016-02-05 DIAGNOSIS — R05 Cough: Secondary | ICD-10-CM | POA: Diagnosis not present

## 2016-02-05 DIAGNOSIS — J209 Acute bronchitis, unspecified: Secondary | ICD-10-CM | POA: Insufficient documentation

## 2016-02-05 DIAGNOSIS — R0602 Shortness of breath: Secondary | ICD-10-CM | POA: Diagnosis not present

## 2016-02-05 LAB — CBC WITH DIFFERENTIAL/PLATELET
BASOS ABS: 0 10*3/uL (ref 0.0–0.1)
Basophils Relative: 0.1 % (ref 0.0–3.0)
EOS ABS: 0.2 10*3/uL (ref 0.0–0.7)
Eosinophils Relative: 1.5 % (ref 0.0–5.0)
HCT: 37.3 % (ref 36.0–46.0)
Hemoglobin: 12.4 g/dL (ref 12.0–15.0)
LYMPHS ABS: 1.3 10*3/uL (ref 0.7–4.0)
LYMPHS PCT: 13.1 % (ref 12.0–46.0)
MCHC: 33.1 g/dL (ref 30.0–36.0)
MCV: 87 fl (ref 78.0–100.0)
MONO ABS: 0.7 10*3/uL (ref 0.1–1.0)
Monocytes Relative: 6.3 % (ref 3.0–12.0)
NEUTROS ABS: 8.2 10*3/uL — AB (ref 1.4–7.7)
NEUTROS PCT: 79 % — AB (ref 43.0–77.0)
PLATELETS: 246 10*3/uL (ref 150.0–400.0)
RBC: 4.29 Mil/uL (ref 3.87–5.11)
RDW: 16 % — AB (ref 11.5–15.5)
WBC: 10.3 10*3/uL (ref 4.0–10.5)

## 2016-02-05 MED ORDER — PREDNISONE 10 MG PO TABS: ORAL_TABLET | ORAL | Status: DC

## 2016-02-05 MED ORDER — HYDROCODONE-HOMATROPINE 5-1.5 MG/5ML PO SYRP: ORAL_SOLUTION | ORAL | Status: DC

## 2016-02-05 MED ORDER — LEVOFLOXACIN 500 MG PO TABS: 500.0000 mg | ORAL_TABLET | Freq: Every day | ORAL | Status: AC

## 2016-02-05 NOTE — Progress Notes (Signed)
Subjective:    Patient ID: Katherine [name], female    DOB: Mar 08, 1956, 60 y.o.   MRN: [id number]  HPI 60 year old female with history of obstructive sleep apnea and obesity hypoventilation syndrome, status post lap band surgery, Asthma ,  chronic cough, upper ~by GERD and aspiration. Previous renal transplant on chronic steroids and Imuran   02/05/2016 Acute office visit Pt presents for an acute office visit. Complains of sinus pressure/drainage, chest congestion/tightness, SOB, prod cough with yellow mucus, wheezing, occ vomiting x 2 months.  Denies any fever or nausea.  Cough keeps her up at night. Says symptoms are on/off , feels the pollen outside are contributing to her symptoms . She has a deviated septum which causes her nasal congestion .  Remains on singuliar , flonase daily .   Seen by Dr. Ernesto Rutherford for sinusitis  With Zpack 1 month ago. Says she got better but symptoms have returned  Has an adopted 56 yr old son.    Past Medical History  Diagnosis Date  . S/p cadaver renal transplant [place]  . 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 07/1993    Rescue PTCA of RCA -- referred for CABG.  . CAD in native artery 07/1993     3 Vessel Disease (LAD-D1 & RCA) -- CABG  . 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 Servando Snare: LIMA-LAD, SVG-bifurcatingD1, SVG-rPDA  . CAD (coronary artery disease) of bypass graft 5/01; 3/'02, 8/'03, 10/'04; 1/15    PCI x 5 to SVG-D1   . CAD S/P percutaneous coronary angioplasty     PCI to SVG-D1 insertion/native D1 x 4 = '01 -(S660 BMS 2.5 x 9 - insertion into D1; '02 - distal overlap ACS Pixel 2.5 x 8  BMS; '03 distal/native ISR/Thrombosis - Pixel 2.5 x 13; '04 - ISR-  Taxus 2.5 x 20 (covered all);; 1/15 - mid SVG-D1 (50% distal ISR) - Promus P 2.75 x 20 -- 2.8 mm  . Unstable angina ([place]) 5/01; 3/'02, 8/'03, 10/'04; 1/15    x 5 occurences since Inf-Post STEMI in 1994  . Diabetes mellitus type 2 in obese ([place])   . Dyslipidemia,  goal LDL below 70     08/2012: TC 137, TG 200, HDL 32!, LDL 45; on statin (followed by Dr.Deterding)  . Bilateral carotid artery stenosis 07/2009    Mild - moderate ~<0-49% internal carotid bilaterally; right bullb 50-69%  . Obesity     s/p Lap Band 04/2004- Port Replacement 10/09 & 2/10 for infection; Dr. Excell Seltzer.  [name] Kitchen MRSA (methicillin resistant staph aureus) culture positive   . Hypertension associated with diabetes ([place])   . Depression with anxiety   . Recurrent boils     Bilateral Groin  . Fibromyalgia   . Rheumatoid arthritis ([place])     Per Patient Report; associated with OA  . H/O: GI bleed   . PAF (paroxysmal atrial fibrillation) ([place])     Noted on CardioNet Monitor 11/5-18 - rate ~112  . PAD (peripheral artery disease) ([place]) 08/2013    LEA Dopplers to be read by Dr. Fletcher Anon  . OSA on CPAP     oxygen @night  @ 2L  . Stroke [facility]) 2012    "right eye stroke- half blind now"  . Enlarged heart     "told one time"  . COPD mixed type ([place])     Followed by Dr. Lamonte Sakai "pulmonologist said no COPD"  . Depression   . Anxiety   .  GERD (gastroesophageal reflux disease)   . Glomerulonephritis, chronic, rapidly progressive 50  . ESRD (end stage renal disease) ([place]) 1991    s/p Cadaveric Renal Transplant [facility] - Dr. Jimmy Footman)    Current Outpatient Prescriptions on File Prior to Visit  Medication Sig Dispense Refill  . acetaminophen (TYLENOL) 500 MG tablet Take 1,500 mg by mouth every 6 (six) hours as needed for moderate pain or headache.    . azaTHIOprine (IMURAN) 50 MG tablet Take 125 mg by mouth every evening.     . calcitRIOL (ROCALTROL) 0.25 MCG capsule Take 0.25 mcg by mouth every morning.     . clopidogrel (PLAVIX) 75 MG tablet TAKE 1 TABLET (75 MG TOTAL) BY MOUTH DAILY. 90 tablet 2  . cyclobenzaprine (FLEXERIL) 10 MG tablet Take 10 mg by mouth at bedtime.     [name] Kitchen diltiazem (CARDIZEM CD) 120 MG 24 hr capsule TAKE 1 CAPSULE (120 MG TOTAL) BY MOUTH DAILY. 30 capsule 10  . DULoxetine  (CYMBALTA) 30 MG capsule Take 30 mg by mouth every morning.     . fluticasone (FLONASE) 50 MCG/ACT nasal spray PLACE 2 SPRAYS INTO THE NOSE DAILY (Patient taking differently: PLACE 1 SPRAY INTO THE NOSE DAILY) 16 g 5  . glimepiride (AMARYL) 4 MG tablet Take 4 mg by mouth daily before breakfast.    . LORazepam (ATIVAN) 1 MG tablet Take 1 mg by mouth every morning.     . metoprolol tartrate (LOPRESSOR) 25 MG tablet Take half tablet (12.5 mg total) by mouth daily. Take whole tablet (25 mg total) by mouth daily if SBP > 150.    . montelukast (SINGULAIR) 10 MG tablet TAKE 1 TABLET (10 MG TOTAL) BY MOUTH DAILY. 30 tablet 5  . nitroGLYCERIN (NITROSTAT) 0.4 MG SL tablet Place 1 tablet (0.4 mg total) under the tongue every 5 (five) minutes as needed for chest pain. 25 tablet 6  . omeprazole (PRILOSEC) 40 MG capsule Take 40 mg by mouth every morning.     . predniSONE (DELTASONE) 5 MG tablet Take 5 mg by mouth every morning.     . promethazine (PHENERGAN) 25 MG tablet Take 25 mg by mouth at bedtime as needed for nausea or vomiting. For nausea and sleep    . rosuvastatin (CRESTOR) 20 MG tablet Take 1 tablet (20 mg total) by mouth daily. 90 tablet 3  . sertraline (ZOLOFT) 100 MG tablet Take 200 mg by mouth every morning.     . VENTOLIN HFA 108 (90 Base) MCG/ACT inhaler INHALE 2 PUFFS INTO THE LUNGS EVERY 6 (SIX) HOURS AS NEEDED FOR WHEEZING OR SHORTNESS OF BREATH. 18 Inhaler 2  . Ferrous Sulfate Dried 45 MG TBCR Take 1 tablet by mouth every morning. Reported on 02/05/2016     No current facility-administered medications on file prior to visit.     Review of Systems Constitutional:   No  weight loss, night sweats,  Fevers, chills, fatigue, or  lassitude.  HEENT:   No headaches,  Difficulty swallowing,  Tooth/dental problems, or  Sore throat,                No sneezing, itching, ear ache,  +nasal congestion, post nasal drip,   CV:  No chest pain,  Orthopnea, PND, swelling in lower extremities, anasarca,  dizziness, palpitations, syncope.   GI  No heartburn, indigestion, abdominal pain, nausea, vomiting, diarrhea, change in bowel habits, loss of appetite, bloody stools.   Resp: .  No chest wall deformity  Skin: no rash  or lesions.  GU: no dysuria, change in color of urine, no urgency or frequency.  No flank pain, no hematuria   MS:  No joint pain or swelling.  No decreased range of motion.  No back pain.  Psych:  No change in mood or affect. No depression or anxiety.  No memory loss.         Objective:   Physical Exam Filed Vitals:   02/05/16 1020  BP: 118/82  Pulse: 67  Temp: 98.1 F (36.7 C)  TempSrc: Oral  Height: 5\' 1"  (1.549 m)  Weight: 198 lb (89.812 kg)  SpO2: 97%  Body mass index is 37.43 kg/(m^2).   GEN: A/Ox3; pleasant , NAD, obese   HEENT:  [place]/AT,  EACs-clear, TMs-wnl, NOSE-clear drainage  THROAT-clear, no lesions, no postnasal drip or exudate noted.   NECK:  Supple w/ fair ROM; no JVD; normal carotid impulses w/o bruits; no thyromegaly or nodules palpated; no lymphadenopathy.  RESP decreased BS in bases , ,no accessory muscle use, no dullness to percussion  CARD:  RRR, no m/r/g  , no peripheral edema, pulses intact, no cyanosis or clubbing.  GI:   Soft & nt; nml bowel sounds; no organomegaly or masses detected.  Musco: Warm bil, no deformities or joint swelling noted.   Neuro: alert, no focal deficits noted.    Skin: Warm, no lesions or rashes  Tammy Parrett NP-C  [facility]  02/05/2016        Assessment & Plan:

## 2016-02-05 NOTE — Telephone Encounter (Signed)
Pt states that the Hydromet is not covered by her insurance/high copay and she needs something. Pt aware that Rexene Edison is gone for the day and is okay with waiting until 02/06/16 for a response.  Please advise Tammy. Thanks.    Patient Instructions 02/05/16     Add Saline nasal rinses Twice daily .  Add Zyrtec 10mg  At bedtime  Hydromet 1 tsp every 6hr as needed. , may make you sleepy. Chest xray And Labs Today .  Levaquin 500mg  daily for 7 days -take with food , eat yogurt.  follow up Dr. Lamonte Sakai In 3 months and As needed  Please contact office for sooner follow up if symptoms do not improve or worsen or seek emergency care

## 2016-02-05 NOTE — Patient Instructions (Addendum)
Add Saline nasal rinses Twice daily  .  Add Zyrtec 10mg  At bedtime   Hydromet 1 tsp every 6hr as needed. , may make you sleepy. Chest xray  And Labs Today .   Levaquin 500mg  daily for 7 days -take with food , eat yogurt.  follow up Dr. Lamonte Sakai  In 3 months and As needed   Please contact office for sooner follow up if symptoms do not improve or worsen or seek emergency care

## 2016-02-05 NOTE — Assessment & Plan Note (Addendum)
Flare  Check Ige/Allergy panel  Check cxr   Plan  Add Saline nasal rinses Twice daily  .  Add Zyrtec 10mg  At bedtime   Hydromet 1 tsp every 6hr as needed. , may make you sleepy. Chest xray  And Labs Today .   Levaquin 500mg  daily for 7 days -take with food , eat yogurt.  follow up Dr. Lamonte Sakai  In 3 months and As needed   Please contact office for sooner follow up if symptoms do not improve or worsen or seek emergency care

## 2016-02-06 ENCOUNTER — Other Ambulatory Visit: Payer: Self-pay | Admitting: Adult Health

## 2016-02-06 DIAGNOSIS — R0600 Dyspnea, unspecified: Secondary | ICD-10-CM

## 2016-02-06 LAB — RESPIRATORY ALLERGY PROFILE REGION II ~~LOC~~
Allergen, Comm Silver Birch, t9: <0.1 kU/L
Allergen, Cottonwood, t14: <0.1 kU/L
Allergen, D pternoyssinus,d7: <0.1 kU/L
Allergen, Mulberry, t76: <0.1 kU/L
Allergen, Oak,t7: <0.1 kU/L
Bermuda Grass: <0.1 kU/L
Box Elder IgE: <0.1 kU/L
Cat Dander: <0.1 kU/L
Cladosporium Herbarum: <0.1 kU/L
Cockroach: <0.1 kU/L
Dog Dander: <0.1 kU/L
Elm IgE: <0.1 kU/L
IgE (Immunoglobulin E), Serum: 7 kU/L (ref <115)
Sheep Sorrel IgE: <0.1 kU/L

## 2016-02-06 MED ORDER — BENZONATATE 200 MG PO CAPS: 200.0000 mg | ORAL_CAPSULE | Freq: Three times a day (TID) | ORAL | Status: DC | PRN

## 2016-02-06 NOTE — Telephone Encounter (Signed)
Patient states that the medication was $30 for 1/2 bottle.   Patient has Medicare/ Medicaid insurance.

## 2016-02-06 NOTE — Telephone Encounter (Signed)
Called and spoke with pharmacist at [facility].  She tried to run through [employer] thinking that this would be covered through Medicare/Medicaid, however, it was not covered. Without insurance it is $13.94 for 30 tablets.  She said that the cheaper option is the Cheratussin, but Cheratussin has Codeine in it and patient states she is allergic to Codeine.  There were no other options.

## 2016-02-06 NOTE — Progress Notes (Signed)
Quick Note:  Called and spoke with pt. Reviewed results and recs. Pt voiced understanding and had no further questions. Lab order has been placed. Nothing further needed at this time. ______

## 2016-02-06 NOTE — Telephone Encounter (Signed)
Called and spoke with pt. Informed her of TP's recs and verified pharmacy as [facility] [address]. She voiced understanding and had no further questions. Rx sent. Nothing further needed.

## 2016-02-06 NOTE — Telephone Encounter (Signed)
That is generic cough syrup. She is allergic to codeine  How much was it.  Can call pharmacy to see why it is so much  Does she not have insurance . ?

## 2016-02-06 NOTE — Telephone Encounter (Signed)
Per verbal order from TP Okay to continue with Tessalon perles

## 2016-02-06 NOTE — Progress Notes (Signed)
Quick Note:  Called and spoke with pt. Reviewed results and recs. Pt voiced understanding and had no further questions. ______ 

## 2016-02-06 NOTE — Telephone Encounter (Signed)
Please call pharmacy and see if they have recs?

## 2016-02-07 ENCOUNTER — Other Ambulatory Visit (INDEPENDENT_AMBULATORY_CARE_PROVIDER_SITE_OTHER): Payer: Medicare Other

## 2016-02-07 DIAGNOSIS — R0600 Dyspnea, unspecified: Secondary | ICD-10-CM

## 2016-02-07 LAB — BRAIN NATRIURETIC PEPTIDE: Pro B Natriuretic peptide (BNP): 139 pg/mL — ABNORMAL HIGH (ref 0.0–100.0)

## 2016-02-10 ENCOUNTER — Telehealth: Payer: Self-pay | Admitting: Emergency Medicine

## 2016-02-11 ENCOUNTER — Other Ambulatory Visit: Payer: Self-pay | Admitting: Cardiology

## 2016-02-11 NOTE — Telephone Encounter (Signed)
Called spoke with pt. She is requesting her lab results. She states it is extremely important for her to know the results. I explained to her that TP is out of the office and that I would send a message to her. She voiced understanding and had no further questions.   TP please advise on lab results.

## 2016-02-11 NOTE — Telephone Encounter (Signed)
Rx request sent to pharmacy.  

## 2016-02-12 NOTE — Progress Notes (Signed)
Quick Note:  Called spoke with pt. Reviewed results and recs. Pt voiced understanding and had no further questions. ______ 

## 2016-02-12 NOTE — Telephone Encounter (Signed)
BNP is ok , imrpoved from last check  Cont w/ ov recs Please contact office for sooner follow up if symptoms do not improve or worsen or seek emergency care

## 2016-02-12 NOTE — Telephone Encounter (Signed)
Spoke with pt. She is aware of results. Nothing further was needed.  

## 2016-02-15 HISTORY — PX: TRANSTHORACIC ECHOCARDIOGRAM: SHX275

## 2016-02-19 ENCOUNTER — Telehealth: Payer: Self-pay | Admitting: Emergency Medicine

## 2016-02-19 ENCOUNTER — Telehealth: Payer: Self-pay | Admitting: Cardiology

## 2016-02-19 NOTE — Telephone Encounter (Signed)
Called spoke with pt. She states that she had chest pain on 02/18/16.She states that she feels like it is coming from not being on her oxygen any longer. She states that she has an ov with Cardiology on 02/20/16 @ 2pm. I explained to her that we have no available within the office today but if the chest pain worsens to go to the ER. She does c/o SOB but feels that it is due to the weather and not having her oxygen. She denies any cough, sinus pressure/drainage, fever, nausea or vomiting. She states that she would rather cardiology check her to make sure it is not her heart. I informed her to keep her ov with her cardiology for tomorrow. She voiced understanding and had no further questions.

## 2016-02-19 NOTE — Telephone Encounter (Signed)
SPOKE TO PATIENT   CHEST PAIN YESTERDAY ,NO PAIN AT PRESENT TIME. OFFERED APPOINTMENT FOR TODAY , UNABLE TO COME TODAY NEXT APPOINTMENT OPENING FOR [name] IS SEPT 2017  DID NOT GO TO ER, OR TAKE ANYTHING  APPOINTMENT SCHEDULE FOR 02/20/16 WITH KATHY THOMPSON AWARE AND ADDRESS -VERBALIZED UNDERSTANDING.

## 2016-02-19 NOTE — Telephone Encounter (Signed)
Pt c/o of Chest Pain: 1. Are you having CP right now? no 2. Are you experiencing any other symptoms (ex. SOB, nausea, vomiting, sweating)? Pain down both arms 3. How long have you been experiencing CP? Yesterday-constant fo rabout 15 min 4. Is your CP continuous or coming and going? constant 5. Have you taken Nitroglycerin? No-chewed ASA   Wants to see Harding-told her he was out until 7-17-does not want to see Luke-pls call [phone]

## 2016-02-20 ENCOUNTER — Ambulatory Visit (INDEPENDENT_AMBULATORY_CARE_PROVIDER_SITE_OTHER): Payer: Medicare Other | Admitting: Physician Assistant

## 2016-02-20 ENCOUNTER — Other Ambulatory Visit: Payer: Self-pay

## 2016-02-20 ENCOUNTER — Encounter: Payer: Self-pay | Admitting: Physician Assistant

## 2016-02-20 VITALS — BP 122/68 | HR 94 | Ht 61.0 in | Wt 202.0 lb

## 2016-02-20 DIAGNOSIS — R011 Cardiac murmur, unspecified: Secondary | ICD-10-CM | POA: Diagnosis not present

## 2016-02-20 DIAGNOSIS — R0602 Shortness of breath: Secondary | ICD-10-CM

## 2016-02-20 DIAGNOSIS — R05 Cough: Secondary | ICD-10-CM

## 2016-02-20 DIAGNOSIS — I6523 Occlusion and stenosis of bilateral carotid arteries: Secondary | ICD-10-CM | POA: Diagnosis not present

## 2016-02-20 DIAGNOSIS — I6521 Occlusion and stenosis of right carotid artery: Secondary | ICD-10-CM

## 2016-02-20 DIAGNOSIS — R059 Cough, unspecified: Secondary | ICD-10-CM

## 2016-02-20 DIAGNOSIS — R0789 Other chest pain: Secondary | ICD-10-CM

## 2016-02-20 MED ORDER — METOPROLOL TARTRATE 25 MG PO TABS: ORAL_TABLET | ORAL | Status: DC

## 2016-02-20 MED ORDER — METOPROLOL TARTRATE 25 MG PO TABS: 12.5000 mg | ORAL_TABLET | Freq: Every day | ORAL | Status: DC

## 2016-02-20 NOTE — Patient Instructions (Addendum)
**Note De-Identified  Obfuscation** Medication Instructions:  Same-no changes  Labwork: None  Testing/Procedures: Your physician has requested that you have an echocardiogram. Echocardiography is a painless test that uses sound waves to create images of your heart. It provides your doctor with information about the size and shape of your heart and how well your heart's chambers and valves are working. This procedure takes approximately one hour. There are no restrictions for this procedure.  Your physician has requested that you have a carotid duplex. This test is an ultrasound of the carotid arteries in your neck. It looks at blood flow through these arteries that supply the brain with blood. Allow one hour for this exam. There are no restrictions or special instructions.  A chest x-ray takes a picture of the organs and structures inside the chest, including the heart, lungs, and blood vessels. This test can show several things, including, whether the heart is enlarges; whether fluid is building up in the lungs; and whether pacemaker / defibrillator leads are still in place.  Your physician has requested that you have a lexiscan myoview. For further information please visit [email or web address]. Please follow instruction sheet, as given.    Follow-Up: 3 months with [name]       If you need a refill on your cardiac medications before your next appointment, please call your pharmacy.

## 2016-02-20 NOTE — Progress Notes (Signed)
Cardiology Office Note    Date:  02/20/2016   ID:  Katherine [name], DOB 01/11/1956, MRN [id number]  PCP:  Placido Sou, MD  Cardiologist:  Dr. Ellyn Hack   CC: chest pain  History of Present Illness:  Katherine [name] is a 60 y.o. female with a history of morbid obesity s/p lap band (2005), CAD s/p CABG x3V (1994) with multiple subsequent PCIs (5x) to SVG-D1, PAD, ESRD s/p cadaver renal transplant at [facility] in 1991 on chronic steroids/imuran, carotid artery disease, COPD, OSA on CPAP, PAF, fibromyalgia, RA, HTN, HLD, mild MR, and previous CVA who was added onto my clinic schedule for evaluation of chest pain.   She initially presented with inferior STEMI in 1994 with rescue PCTA to RCA and then referred for CABG x3V .She has had 5 subsequent PCIs to SVG-D1, each in the setting of [place]. Last cath was in 08/2013 for [place] and DES placed to SVG-D1   She has a history of PAF which has been infrequent and not been on [place] due to need for GI concerns and frequent GI procedures (per Dr. Allison Quarry last note).   Last seen by Dr. Ellyn Hack in 05/2015 and felt to be doing well from a cardiac standpoint.   She was seen a couple of weeks ago on 02/05/16 by PCCM for evaluation of cough and congestion and felt to have acute bronchitis. Treated with cough syrup, zyrtec, Levaquin, and increased prednisone. BNP was mildly abnormal.   Today she presents to clinic for evaluation of chest pain. She had some chest pain on Tuesday AM (02/18/16) when she was sitting on her bed taking her medications. It felt similar to when she had her heart attack in 1994. It lasted about 30 minutes and then resolved on its own. She took 4 ASA but did not take SL NTG due to her SBP being ~ 90s. This has not recurred since. She is very active running after a 62 year old adopted son. She does not get any exertional CP but some SOB. No LE edema, orthopnea or PND. She has some mild dizziness but no syncope. Still with cough and congestion and  wants a chest x ray.       Past Medical History  Diagnosis Date  . S/p cadaver renal transplant [place]  . 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 07/1993    Rescue PTCA of RCA -- referred for CABG.  . CAD in native artery 07/1993     3 Vessel Disease (LAD-D1 & RCA) -- CABG  . 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 Servando Snare: LIMA-LAD, SVG-bifurcatingD1, SVG-rPDA  . CAD (coronary artery disease) of bypass graft 5/01; 3/'02, 8/'03, 10/'04; 1/15    PCI x 5 to SVG-D1   . CAD S/P percutaneous coronary angioplasty     PCI to SVG-D1 insertion/native D1 x 4 = '01 -(S660 BMS 2.5 x 9 - insertion into D1; '02 - distal overlap ACS Pixel 2.5 x 8  BMS; '03 distal/native ISR/Thrombosis - Pixel 2.5 x 13; '04 - ISR-  Taxus 2.5 x 20 (covered all);; 1/15 - mid SVG-D1 (50% distal ISR) - Promus P 2.75 x 20 -- 2.8 mm  . Unstable angina ([place]) 5/01; 3/'02, 8/'03, 10/'04; 1/15    x 5 occurences since Inf-Post STEMI in 1994  . Diabetes mellitus type 2 in obese ([place])   . Dyslipidemia, goal LDL below 70     08/2012: TC 137, TG 200,  HDL 32!, LDL 45; on statin (followed by Dr.Deterding)  . Bilateral carotid artery stenosis 07/2009    Mild - moderate ~<0-49% internal carotid bilaterally; right bullb 50-69%  . Obesity     s/p Lap Band 04/2004- Port Replacement 10/09 & 2/10 for infection; Dr. Excell Seltzer.  [name] Kitchen MRSA (methicillin resistant staph aureus) culture positive   . Hypertension associated with diabetes ([place])   . Depression with anxiety   . Recurrent boils     Bilateral Groin  . Fibromyalgia   . Rheumatoid arthritis ([place])     Per Patient Report; associated with OA  . H/O: GI bleed   . PAF (paroxysmal atrial fibrillation) ([place])     Noted on CardioNet Monitor 11/5-18 - rate ~112  . PAD (peripheral artery disease) ([place]) 08/2013    LEA Dopplers to be read by Dr. Fletcher Anon  . OSA on CPAP     oxygen @night  @ 2L  . Stroke [facility]) 2012    "right eye stroke- half blind now"  . Enlarged heart      "told one time"  . COPD mixed type ([place])     Followed by Dr. Lamonte Sakai "pulmonologist said no COPD"  . Depression   . Anxiety   . GERD (gastroesophageal reflux disease)   . Glomerulonephritis, chronic, rapidly progressive 76  . ESRD (end stage renal disease) ([place]) 1991    s/p Cadaveric Renal Transplant [facility] - Dr. Jimmy Footman)     Past Surgical History  Procedure Laterality Date  . Laparoscopic gastric banding  04/2004; 10/'09, 2/'10    Port Replacement x 2  . Catheter removal    . Coronary artery bypass graft  1995    LIMA-LAD, SVG-RPDA, SVG-D1  . Kidney transplant  1991  . Wrist fistula repair Left     dialysis for one year  . Tubal ligation    . Cardiac catheterization  5/'01, 3/'02, 8/'03, 10/'04; 1/'15    08/22/2013: LAD & RCA 100%; LIMA-LAD & SVG-rPDA patent; Cx-- OM1 60%, OM2 ostial ~50%; SVG-D1 - 80% mid, 50% distal ISR --PCI  . Port-a-cath removal      kidney  . Incise and drain abcess    . Cardiac catheterization  5/'01, 3/'02, 8/'03, 10/'04; 1/'15    08/22/2013: LAD & RCA 100%; LIMA-LAD & SVG-rPDA patent; Cx-- OM1 60%, OM2 ostial ~50%; SVG-D1 - 80% mid, 50% distal ISR --PCI  . Percutaneous coronary stent intervention (pci-s)  5/'01, 3/'02, 8/'03, 10/'04;    '01 - S660 BMS 2.5 x 9 - dSVG-D1 into D1; '02- post-stent stenosis - 2.5 x 8 Pixel BMS; '8\03: ISR/Thrombosis into native D1 - AngioJet, 2.5 x 13 Pixel; '04 - ISR 95% - covered stented area with Taxus DES 2.5 mm x 20 (2.88)  . Percutaneous coronary stent intervention (pci-s)  08/22/2013    mid SVG-D1 80%; distal stent ~50% ISR; Promus [place] DES 2.75 mm xc 20 mm (2.8 mm)  . Transthoracic echocardiogram  02/2013    NL LV Size, EF 55-60%; MAC with Mild MR.  . Lower extremity arterial dopplers  08/2013    ABI: R 0.96, L 1.04  . Left heart catheterization with coronary/graft angiogram N/A 08/23/2013    Procedure: LEFT HEART CATHETERIZATION WITH Beatrix Fetters;  Surgeon: Wellington Hampshire, MD;  Location[facility] C[facility] LAB;   Service: Cardiovascular;  Laterality: N/A;  . Percutaneous coronary stent intervention (pci-s)  08/23/2013    Procedure: PERCUTANEOUS CORONARY STENT INTERVENTION (PCI-S);  Surgeon: Wellington Hampshire, MD;  Location: [facility] CATH LAB;  Service: Cardiovascular;;  . Coronary angioplasty  1994    x5  . Multiple tooth extractions  age 42  . Cholecystectomy N/A 10/29/2014    Procedure: LAPAROSCOPIC CHOLECYSTECTOMY WITH INTRAOPERATIVE CHOLANGIOGRAM;  Surgeon: Excell Seltzer, MD;  Location: [facility] ORS;  Service: General;  Laterality: N/A;    Current Medications: Outpatient Prescriptions Prior to Visit  Medication Sig Dispense Refill  . acetaminophen (TYLENOL) 500 MG tablet Take 1,500 mg by mouth every 6 (six) hours as needed for moderate pain or headache.    . azaTHIOprine (IMURAN) 50 MG tablet Take 125 mg by mouth every evening.     . benzonatate (TESSALON) 200 MG capsule Take 1 capsule (200 mg total) by mouth 3 (three) times daily as needed for cough. 30 capsule 0  . calcitRIOL (ROCALTROL) 0.25 MCG capsule Take 0.25 mcg by mouth every morning.     . clopidogrel (PLAVIX) 75 MG tablet TAKE 1 TABLET (75 MG TOTAL) BY MOUTH DAILY. 90 tablet 1  . cyclobenzaprine (FLEXERIL) 10 MG tablet Take 10 mg by mouth at bedtime.     [name] Kitchen diltiazem (CARDIZEM CD) 120 MG 24 hr capsule TAKE 1 CAPSULE (120 MG TOTAL) BY MOUTH DAILY. 30 capsule 10  . DULoxetine (CYMBALTA) 30 MG capsule Take 30 mg by mouth every morning.     . Ferrous Sulfate Dried 45 MG TBCR Take 1 tablet by mouth every morning. Reported on 02/05/2016    . glimepiride (AMARYL) 4 MG tablet Take 4 mg by mouth daily before breakfast.    . HYDROcodone-homatropine (HYCODAN) 5-1.5 MG/5ML syrup 1 tsp every 6 hours as needed for cough (may make you sleepy) 240 mL 0  . LORazepam (ATIVAN) 1 MG tablet Take 1 mg by mouth every morning.     . montelukast (SINGULAIR) 10 MG tablet TAKE 1 TABLET (10 MG TOTAL) BY MOUTH DAILY. 30 tablet 5  . nitroGLYCERIN (NITROSTAT) 0.4 MG SL tablet  Place 1 tablet (0.4 mg total) under the tongue every 5 (five) minutes as needed for chest pain. 25 tablet 6  . omeprazole (PRILOSEC) 40 MG capsule Take 40 mg by mouth every morning.     . predniSONE (DELTASONE) 10 MG tablet 2 daily for 1 week, 1 daily for 1 week and then 5mg  daily 21 tablet 0  . predniSONE (DELTASONE) 5 MG tablet Take 5 mg by mouth every morning.     . promethazine (PHENERGAN) 25 MG tablet Take 25 mg by mouth at bedtime as needed for nausea or vomiting. For nausea and sleep    . rosuvastatin (CRESTOR) 20 MG tablet Take 1 tablet (20 mg total) by mouth daily. 90 tablet 3  . sertraline (ZOLOFT) 100 MG tablet Take 200 mg by mouth every morning.     . VENTOLIN HFA 108 (90 Base) MCG/ACT inhaler INHALE 2 PUFFS INTO THE LUNGS EVERY 6 (SIX) HOURS AS NEEDED FOR WHEEZING OR SHORTNESS OF BREATH. 18 Inhaler 2  . metoprolol tartrate (LOPRESSOR) 25 MG tablet Take half tablet (12.5 mg total) by mouth daily. Take whole tablet (25 mg total) by mouth daily if SBP > 150.    . fluticasone (FLONASE) 50 MCG/ACT nasal spray PLACE 2 SPRAYS INTO THE NOSE DAILY (Patient taking differently: PLACE 1 SPRAY INTO THE NOSE DAILY) 16 g 5   No facility-administered medications prior to visit.     Allergies:   Codeine; Erythromycin; Hydromorphone hcl; Morphine and related; Nalbuphine; Niaspan; Sulfonamide derivatives; and Tetracycline   Social History   Social History  . Marital Status:  Married    Spouse Name: N/A  . Number of Children: N/A  . Years of Education: N/A   Social History Main Topics  . Smoking status: Former Smoker -- 1.00 packs/day for 30 years    Types: Cigarettes    Quit date: 08/17/2002  . Smokeless tobacco: Never Used  . Alcohol Use: No  . Drug Use: No  . Sexual Activity: Not Asked   Other Topics Concern  . None   Social History Narrative   She is currently married, and the caregiver of her husband who is recovering from surgery for tongue cancer now diagnosed with lung cancer.  Prior to his diagnosis of her husband, she actually had adopted a 36-year-old child who she knows caring for as well. With all the surgeries, they have been quite financially troubled. Thanks the help of her community and church, they have been able to stay "alfoat."     She is a former smoker who quit in 2004 after a 30-pack-year history.   She is active chasing a 48-year-old child, does not do routine exercise. She's been quite depressed with the condition of her husband, and admits to eating comfort herself.   She does not drink alcohol.     Family History:  The patient's family history includes Arrhythmia in her brother and paternal aunt; Cancer in her father and mother; Heart disease in her father.     ROS:   Please see the history of present illness.    ROS All other systems reviewed and are negative.   PHYSICAL EXAM:   VS:  BP 122/68 mmHg  Pulse 94  Ht 5\' 1"  (1.549 m)  Wt 202 lb (91.627 kg)  BMI 38.19 kg/m2   GEN: Well nourished, well developed, in no acute distress, obese HEENT: normal Neck: no JVD, carotid bruits, or masses Cardiac: RRR; + murmur, rubs, or gallops,no edema  Respiratory:  clear to auscultation bilaterally, normal work of breathing GI: soft, nontender, nondistended, + BS MS: no deformity or atrophy Skin: warm and dry, no rash Neuro:  Alert and Oriented x 3, Strength and sensation are intact Psych: euthymic mood, full affect  Wt Readings from Last 3 Encounters:  02/20/16 202 lb (91.627 kg)  02/05/16 198 lb (89.812 kg)  05/20/15 219 lb 12.8 oz (99.701 kg)      Studies/Labs Reviewed:   EKG:  EKG is ordered today.  The ekg ordered today demonstrates NSR, HR 94.   Recent Labs: 02/05/2016: Hemoglobin 12.4; Platelets 246.0 02/07/2016: Pro B Natriuretic peptide (BNP) 139.0*   Lipid Panel No results found for: CHOL, TRIG, HDL, CHOLHDL, VLDL, LDLCALC, LDLDIRECT  Additional studies/ records that were reviewed today include:  Cath 08/2013 Final Conclusions:    1. Severe 2 vessel CAD with patent LIMA to LAD, SVG to RPDA and SVG to diagonal. However, there is 80% stenosis in SVG to diagonal proximal to previously placed stents.  2. Mildly elevated LVEDP.  3. Severe calcified disease in right common femoral artery.  4. Successful drug eluting stent placement to SVG to diagonal.   Recommendations:  Continue lifelong dual antiplatelet therapy. Hydrate overnight. Recommend an ABI and lower extremity arterial duplex which can be done as an outpatient. .  2D ECHO: 02/22/2013 LV EF: 55% -  60% Study Conclusions - Left ventricle: The cavity size was normal. Systolic function was normal. The estimated ejection fraction was in the range of 55% to 60%. Mild hypokinesis of the anteroseptal myocardium. Doppler parameters are consistent with abnormal left  ventricular relaxation (grade 1diastolic dysfunction). - Mitral valve: Calcified annulus. Mild regurgitation. Valve area by pressure half-time: 1.73cm^2. - Atrial septum: No defect or patent foramen ovale was identified.   ASSESSMENT & PLAN:  Badia Derck is a 60 y.o. female with a history of morbid obesity s/p lap band (2005), CAD s/p CABG x3V (1994) with multiple subsequent PCIs (5x) to SVG-D1, PAD, ESRD s/p cadaver renal transplant at [facility] in 1991 on chronic steroids/imuran, carotid artery disease, COPD, OSA on CPAP, PAF, fibromyalgia, RA, HTN, HLD, mild MR, and previous CVA who was added onto my clinic schedule for evaluation of chest pain.   Chest pain: occurred once a few days ago and self resolved after 30 minutes. Similar to her previous chest pain with her heart attack but has not recurred. Will get lexiscan myoview.   CAD: extensive hx as above. Maintained on lifelong plavix. Continue BB and statin.   Murmur: most recent echo in 2014 with mild MR. Will update 2D ECHO.   HTN: BP well controlled today   CKD: ESRD s/p cadaver renal transplant at [facility] in 1991 on chronic  steroids/imuran  Carotid artery disease: R Bulb/Prox ICA 50-69% diameter reduction in 06/2014. Overdue for repeat dopplers. Will have this arranged.   PAF: She has a history of PAF which has been infrequent and not been on [place] due to need for GI concerns and frequent GI procedures (per Dr. Allison Quarry last note). CHADSVASC score of at least 4 (HTN, F sex, CVA, CAD). Continue BB.   Cough: recent bronchitis, still with SOB and cough. Patient requests CXR. Will have this done.   Medication Adjustments/Labs and Tests Ordered: Current medicines are reviewed at length with the patient today.  Concerns regarding medicines are outlined above.  Medication changes, Labs and Tests ordered today are listed in the Patient Instructions below.   Patient Instructions  Medication Instructions:  Same-no changes  Labwork: None  Testing/Procedures: Your physician has requested that you have an echocardiogram. Echocardiography is a painless test that uses sound waves to create images of your heart. It provides your doctor with information about the size and shape of your heart and how well your heart's chambers and valves are working. This procedure takes approximately one hour. There are no restrictions for this procedure.  Your physician has requested that you have a carotid duplex. This test is an ultrasound of the carotid arteries in your neck. It looks at blood flow through these arteries that supply the brain with blood. Allow one hour for this exam. There are no restrictions or special instructions.  A chest x-ray takes a picture of the organs and structures inside the chest, including the heart, lungs, and blood vessels. This test can show several things, including, whether the heart is enlarges; whether fluid is building up in the lungs; and whether pacemaker / defibrillator leads are still in place.  Your physician has requested that you have a lexiscan myoview. For further information please visit  [email or web address]. Please follow instruction sheet, as given.    Follow-Up: 3 months with [name]       If you need a refill on your cardiac medications before your next appointment, please call your pharmacy.       Signed, Angelena Form, PA-C  02/20/2016 3:35 PM    [facility] [address], [place], [place]  [address] Phone: [phone]; Fax: [phone]

## 2016-02-24 ENCOUNTER — Inpatient Hospital Stay (HOSPITAL_COMMUNITY): Admission: RE | Admit: 2016-02-24 | Payer: Medicare Other | Source: Ambulatory Visit

## 2016-02-28 ENCOUNTER — Ambulatory Visit (HOSPITAL_COMMUNITY)
Admission: RE | Admit: 2016-02-28 | Discharge: 2016-02-28 | Disposition: A | Discharge status: Home / Self Care | Payer: Medicare Other | Source: Ambulatory Visit | Attending: Cardiovascular Disease | Admitting: Cardiovascular Disease

## 2016-02-28 DIAGNOSIS — I6523 Occlusion and stenosis of bilateral carotid arteries: Secondary | ICD-10-CM | POA: Insufficient documentation

## 2016-02-28 DIAGNOSIS — F419 Anxiety disorder, unspecified: Secondary | ICD-10-CM | POA: Insufficient documentation

## 2016-02-28 DIAGNOSIS — I2510 Atherosclerotic heart disease of native coronary artery without angina pectoris: Secondary | ICD-10-CM | POA: Diagnosis not present

## 2016-02-28 DIAGNOSIS — G4733 Obstructive sleep apnea (adult) (pediatric): Secondary | ICD-10-CM | POA: Diagnosis not present

## 2016-02-28 DIAGNOSIS — F329 Major depressive disorder, single episode, unspecified: Secondary | ICD-10-CM | POA: Diagnosis not present

## 2016-02-28 DIAGNOSIS — M069 Rheumatoid arthritis, unspecified: Secondary | ICD-10-CM | POA: Insufficient documentation

## 2016-02-28 DIAGNOSIS — E785 Hyperlipidemia, unspecified: Secondary | ICD-10-CM | POA: Diagnosis not present

## 2016-02-28 DIAGNOSIS — I120 Hypertensive chronic kidney disease with stage 5 chronic kidney disease or end stage renal disease: Secondary | ICD-10-CM | POA: Insufficient documentation

## 2016-02-28 DIAGNOSIS — N186 End stage renal disease: Secondary | ICD-10-CM | POA: Insufficient documentation

## 2016-02-28 DIAGNOSIS — J449 Chronic obstructive pulmonary disease, unspecified: Secondary | ICD-10-CM | POA: Diagnosis not present

## 2016-02-28 DIAGNOSIS — E1122 Type 2 diabetes mellitus with diabetic chronic kidney disease: Secondary | ICD-10-CM | POA: Diagnosis not present

## 2016-02-28 DIAGNOSIS — K219 Gastro-esophageal reflux disease without esophagitis: Secondary | ICD-10-CM | POA: Insufficient documentation

## 2016-02-28 DIAGNOSIS — I6521 Occlusion and stenosis of right carotid artery: Secondary | ICD-10-CM

## 2016-03-02 ENCOUNTER — Encounter (HOSPITAL_COMMUNITY): Payer: Self-pay

## 2016-03-05 ENCOUNTER — Telehealth (HOSPITAL_COMMUNITY): Payer: Self-pay | Admitting: *Deleted

## 2016-03-05 NOTE — Telephone Encounter (Signed)
Patient given detailed instructions per Myocardial Perfusion Study Information Sheet for the test on 03/10/16. Patient notified to arrive 15 minutes early and that it is imperative to arrive on time for appointment to keep from having the test rescheduled.  If you need to cancel or reschedule your appointment, please call the office within 24 hours of your appointment. Failure to do so may result in a cancellation of your appointment, and a $50 no show fee. Patient verbalized understanding. Hubbard Robinson, RN

## 2016-03-10 ENCOUNTER — Other Ambulatory Visit (HOSPITAL_COMMUNITY): Payer: Self-pay

## 2016-03-10 ENCOUNTER — Ambulatory Visit (HOSPITAL_BASED_OUTPATIENT_CLINIC_OR_DEPARTMENT_OTHER): Payer: Medicare Other

## 2016-03-10 ENCOUNTER — Ambulatory Visit (HOSPITAL_COMMUNITY): Payer: Medicare Other | Attending: Cardiology

## 2016-03-10 DIAGNOSIS — I358 Other nonrheumatic aortic valve disorders: Secondary | ICD-10-CM | POA: Insufficient documentation

## 2016-03-10 DIAGNOSIS — J449 Chronic obstructive pulmonary disease, unspecified: Secondary | ICD-10-CM | POA: Insufficient documentation

## 2016-03-10 DIAGNOSIS — Z9889 Other specified postprocedural states: Secondary | ICD-10-CM | POA: Diagnosis not present

## 2016-03-10 DIAGNOSIS — R0602 Shortness of breath: Secondary | ICD-10-CM | POA: Diagnosis not present

## 2016-03-10 DIAGNOSIS — R0789 Other chest pain: Secondary | ICD-10-CM | POA: Diagnosis not present

## 2016-03-10 DIAGNOSIS — R011 Cardiac murmur, unspecified: Secondary | ICD-10-CM

## 2016-03-10 DIAGNOSIS — I517 Cardiomegaly: Secondary | ICD-10-CM | POA: Diagnosis not present

## 2016-03-10 DIAGNOSIS — I059 Rheumatic mitral valve disease, unspecified: Secondary | ICD-10-CM | POA: Diagnosis not present

## 2016-03-10 DIAGNOSIS — Z951 Presence of aortocoronary bypass graft: Secondary | ICD-10-CM | POA: Diagnosis not present

## 2016-03-10 DIAGNOSIS — R0600 Dyspnea, unspecified: Secondary | ICD-10-CM | POA: Diagnosis present

## 2016-03-10 MED ORDER — TECHNETIUM TC 99M TETROFOSMIN IV KIT
32.3000 | PACK | Freq: Once | INTRAVENOUS | Status: AC | PRN
  Administered 2016-03-10: 32.3 via INTRAVENOUS
  Filled 2016-03-10: qty 32

## 2016-03-10 MED ORDER — REGADENOSON 0.4 MG/5ML IV SOLN
0.4000 mg | Freq: Once | INTRAVENOUS | Status: AC
  Administered 2016-03-10: 0.4 mg via INTRAVENOUS

## 2016-03-13 ENCOUNTER — Ambulatory Visit (HOSPITAL_COMMUNITY): Payer: Medicare Other

## 2016-03-14 ENCOUNTER — Other Ambulatory Visit: Payer: Self-pay | Admitting: Cardiology

## 2016-03-16 NOTE — Telephone Encounter (Signed)
REFILL 

## 2016-03-17 HISTORY — PX: NM MYOVIEW LTD: HXRAD82

## 2016-03-19 ENCOUNTER — Telehealth: Payer: Self-pay | Admitting: Cardiology

## 2016-03-19 ENCOUNTER — Ambulatory Visit (HOSPITAL_COMMUNITY): Payer: Medicare Other

## 2016-03-19 ENCOUNTER — Ambulatory Visit (HOSPITAL_COMMUNITY): Payer: Medicare Other | Attending: Cardiology

## 2016-03-19 LAB — MYOCARDIAL PERFUSION IMAGING
CHL CUP NUCLEAR SDS: 0
CHL CUP RESTING HR STRESS: 69 {beats}/min
LVDIAVOL: 73 mL (ref 46–106)
LVSYSVOL: 28 mL
Peak HR: 90 {beats}/min
RATE: 0.2
SRS: 18
SSS: 13
TID: 0.9

## 2016-03-19 MED ORDER — TECHNETIUM TC 99M TETROFOSMIN IV KIT
32.5000 | PACK | Freq: Once | INTRAVENOUS | Status: AC | PRN
  Administered 2016-03-19: 33 via INTRAVENOUS
  Filled 2016-03-19: qty 33

## 2016-03-19 NOTE — Telephone Encounter (Signed)
Pt aware of her stress test results and verbalized understanding.

## 2016-03-19 NOTE — Telephone Encounter (Signed)
New message ° ° °Pt is returning call for rn °

## 2016-03-21 ENCOUNTER — Other Ambulatory Visit: Payer: Self-pay | Admitting: Emergency Medicine

## 2016-03-30 ENCOUNTER — Encounter: Payer: Medicare Other | Attending: Physical Medicine & Rehabilitation | Admitting: Physical Medicine & Rehabilitation

## 2016-03-30 ENCOUNTER — Encounter: Payer: Self-pay | Admitting: Physical Medicine & Rehabilitation

## 2016-03-30 VITALS — BP 143/93 | HR 83

## 2016-03-30 DIAGNOSIS — Z7952 Long term (current) use of systemic steroids: Secondary | ICD-10-CM | POA: Diagnosis not present

## 2016-03-30 DIAGNOSIS — M25552 Pain in left hip: Secondary | ICD-10-CM

## 2016-03-30 DIAGNOSIS — E1122 Type 2 diabetes mellitus with diabetic chronic kidney disease: Secondary | ICD-10-CM | POA: Insufficient documentation

## 2016-03-30 DIAGNOSIS — N186 End stage renal disease: Secondary | ICD-10-CM | POA: Insufficient documentation

## 2016-03-30 DIAGNOSIS — Z940 Kidney transplant status: Secondary | ICD-10-CM | POA: Diagnosis not present

## 2016-03-30 DIAGNOSIS — I2510 Atherosclerotic heart disease of native coronary artery without angina pectoris: Secondary | ICD-10-CM | POA: Insufficient documentation

## 2016-03-30 DIAGNOSIS — Z79899 Other long term (current) drug therapy: Secondary | ICD-10-CM

## 2016-03-30 DIAGNOSIS — M25551 Pain in right hip: Secondary | ICD-10-CM | POA: Diagnosis not present

## 2016-03-30 DIAGNOSIS — Z951 Presence of aortocoronary bypass graft: Secondary | ICD-10-CM | POA: Diagnosis not present

## 2016-03-30 DIAGNOSIS — M47816 Spondylosis without myelopathy or radiculopathy, lumbar region: Secondary | ICD-10-CM

## 2016-03-30 DIAGNOSIS — M159 Polyosteoarthritis, unspecified: Secondary | ICD-10-CM | POA: Diagnosis not present

## 2016-03-30 DIAGNOSIS — I120 Hypertensive chronic kidney disease with stage 5 chronic kidney disease or end stage renal disease: Secondary | ICD-10-CM | POA: Diagnosis not present

## 2016-03-30 DIAGNOSIS — I6523 Occlusion and stenosis of bilateral carotid arteries: Secondary | ICD-10-CM | POA: Diagnosis not present

## 2016-03-30 DIAGNOSIS — M25561 Pain in right knee: Secondary | ICD-10-CM

## 2016-03-30 DIAGNOSIS — F419 Anxiety disorder, unspecified: Secondary | ICD-10-CM | POA: Insufficient documentation

## 2016-03-30 DIAGNOSIS — E1169 Type 2 diabetes mellitus with other specified complication: Secondary | ICD-10-CM | POA: Insufficient documentation

## 2016-03-30 DIAGNOSIS — Z5181 Encounter for therapeutic drug level monitoring: Secondary | ICD-10-CM

## 2016-03-30 DIAGNOSIS — M25562 Pain in left knee: Secondary | ICD-10-CM

## 2016-03-30 DIAGNOSIS — M797 Fibromyalgia: Secondary | ICD-10-CM

## 2016-03-30 DIAGNOSIS — E1142 Type 2 diabetes mellitus with diabetic polyneuropathy: Secondary | ICD-10-CM | POA: Insufficient documentation

## 2016-03-30 DIAGNOSIS — M4726 Other spondylosis with radiculopathy, lumbar region: Secondary | ICD-10-CM | POA: Insufficient documentation

## 2016-03-30 MED ORDER — GABAPENTIN 100 MG PO CAPS: 100.0000 mg | ORAL_CAPSULE | Freq: Three times a day (TID) | ORAL | 3 refills | Status: DC

## 2016-03-30 MED ORDER — DULOXETINE HCL 30 MG PO CPEP: 60.0000 mg | ORAL_CAPSULE | Freq: Every morning | ORAL | 3 refills | Status: DC

## 2016-03-30 NOTE — Progress Notes (Signed)
Subjective:    Patient ID: Katherine [name], female    DOB: 1955-09-29, 60 y.o.   MRN: [id number]  HPI   This is an initial visit for Katherine [name] who is a pleasant 60 yo white female referred to me by Dr. Jeneen Rinks Deterding for widespread pain. She has a history of ESRD (renal transplant [place]), remote stroke, CAD with CABG and multiple other procedures, OSA on CPAP, DM2. She is on chronic prednisone and imuran for her renal transplant.   Katherine [name] reports her pain is most severe in her bilateral hips and bilateral knees. They can bother her with weight bearing and even at night. Her hip pain is laterally and also in the groin area. Steps are very difficult for her. She has bilateral numbness in her feet which can come and go--often it's most prominent in the evening when she's trying to sleep. Her lower back is also painful and impacts basic ADL's. It is difficult for her to compete house chores and prepare dinner within a given day due to her multiple sites of pain. Because her pain has gradually increased,  it's lead to further depression despite being on two antidepressant medications (cymbalta and zoloft).. She is doing "less and less" and feels that the her decreased functionality is out of her control.   I found an MRI from 03/07/14. Only significant finding was: L4-L5: Mild broad-based disc bulge with a shallow left foraminal disc protrusion abutting the left L4 nerve root. Moderate bilateral facet arthropathy, right greater than left. No evidence of neural foraminal stenosis. No central canal stenosis.  For pain control, she has been on oxycodone in the past remotely---she used it sparingly when she was taking it.  Currently for pain control, she uses alka seltzer plus around 11AM in the morning, a heating pad, stretching (on the floor). She likes to swim, but hasn't been motivated to get to the pool.   Sleep is fair. Pain often awakens her at night especially if she moves or tries to  lift her arms overhead. She takes tylenol pm at bedtime which helps somewhat.     Pain Inventory Average Pain 8 Pain Right Now 6 My pain is constant, burning, dull and aching  In the last 24 hours, has pain interfered with the following? General activity 7 Relation with others 7 Enjoyment of life 7 What TIME of day is your pain at its worst? morning and night Sleep (in general) Fair  Pain is worse with: walking, bending, standing and some activites Pain improves with: heat/ice and medication Relief from Meds: 9  Mobility walk without assistance how many minutes can you walk? 10 ability to climb steps?  yes do you drive?  yes  Function retired I need assistance with the following:  household duties Do you have any goals in this area?  yes  Neuro/Psych weakness numbness tingling trouble walking spasms depression anxiety  Prior Studies 1st visit  Physicians involved in your care 1st visit  Outpatient Encounter Prescriptions as of 03/30/2016  Medication Sig Note  . acetaminophen (TYLENOL) 500 MG tablet Take 1,500 mg by mouth every 6 (six) hours as needed for moderate pain or headache.   . azaTHIOprine (IMURAN) 50 MG tablet Take 125 mg by mouth every evening.    . calcitRIOL (ROCALTROL) 0.25 MCG capsule Take 0.25 mcg by mouth every morning.    . clopidogrel (PLAVIX) 75 MG tablet TAKE 1 TABLET (75 MG TOTAL) BY MOUTH DAILY.   . cyclobenzaprine (FLEXERIL) 10  MG tablet Take 10 mg by mouth at bedtime.    [name] Kitchen diltiazem (CARDIZEM CD) 120 MG 24 hr capsule TAKE 1 CAPSULE (120 MG TOTAL) BY MOUTH DAILY.   . DULoxetine (CYMBALTA) 30 MG capsule Take 30 mg by mouth every morning.    . fluticasone (FLONASE) 50 MCG/ACT nasal spray Place 1 spray into both nostrils as directed.   [name] Kitchen LORazepam (ATIVAN) 1 MG tablet Take 1 mg by mouth every morning.    . metoprolol tartrate (LOPRESSOR) 25 MG tablet Take half tablet (12.5 mg total) by mouth daily. Take whole tablet (25 mg total) by mouth  daily if SBP > 150.   . montelukast (SINGULAIR) 10 MG tablet TAKE 1 TABLET (10 MG TOTAL) BY MOUTH DAILY.   . nitroGLYCERIN (NITROSTAT) 0.4 MG SL tablet Place 1 tablet (0.4 mg total) under the tongue every 5 (five) minutes as needed for chest pain.   [name] Kitchen omeprazole (PRILOSEC) 40 MG capsule Take 40 mg by mouth every morning.    . predniSONE (DELTASONE) 5 MG tablet Take 5 mg by mouth every morning.    . promethazine (PHENERGAN) 25 MG tablet Take 25 mg by mouth at bedtime as needed for nausea or vomiting. For nausea and sleep   . rosuvastatin (CRESTOR) 20 MG tablet TAKE 1 TABLET (20 MG TOTAL) BY MOUTH DAILY.   [name] Kitchen sertraline (ZOLOFT) 100 MG tablet Take 200 mg by mouth every morning.    . VENTOLIN HFA 108 (90 Base) MCG/ACT inhaler INHALE 2 PUFFS INTO THE LUNGS EVERY 6 (SIX) HOURS AS NEEDED FOR WHEEZING OR SHORTNESS OF BREATH.   . [DISCONTINUED] predniSONE (DELTASONE) 10 MG tablet 2 daily for 1 week, 1 daily for 1 week and then 5mg  daily   . [DISCONTINUED] benzonatate (TESSALON) 200 MG capsule Take 1 capsule (200 mg total) by mouth 3 (three) times daily as needed for cough.   . [DISCONTINUED] Ferrous Sulfate Dried 45 MG TBCR Take 1 tablet by mouth every morning. Reported on 02/05/2016   . [DISCONTINUED] glimepiride (AMARYL) 4 MG tablet Take 4 mg by mouth daily before breakfast. 05/20/2015: Received from: External Pharmacy Received Sig:   . [DISCONTINUED] HYDROcodone-homatropine (HYCODAN) 5-1.5 MG/5ML syrup 1 tsp every 6 hours as needed for cough (may make you sleepy)    No facility-administered encounter medications on file as of 03/30/2016.      Family History  Problem Relation Age of Onset  . Cancer Mother     liver  . Heart disease Father   . Cancer Father     colon  . Arrhythmia Brother     Atrial Fibrillation  . Arrhythmia Paternal Aunt     Atrial Fibrillation   Social History   Social History  . Marital status: Married    Spouse name: N/A  . Number of children: N/A  . Years of  education: N/A   Social History Main Topics  . Smoking status: Former Smoker    Packs/day: 1.00    Years: 30.00    Types: Cigarettes    Quit date: 08/17/2002  . Smokeless tobacco: Never Used  . Alcohol use No  . Drug use: No  . Sexual activity: Not Asked   Other Topics Concern  . None   Social History Narrative   She is currently married, and the caregiver of her husband who is recovering from surgery for tongue cancer now diagnosed with lung cancer. Prior to his diagnosis of her husband, she actually had adopted a 36-year-old child who she knows caring  for as well. With all the surgeries, they have been quite financially troubled. Thanks the help of her community and church, they have been able to stay "alfoat."     She is a former smoker who quit in 2004 after a 30-pack-year history.   She is active chasing a 15-year-old child, does not do routine exercise. She's been quite depressed with the condition of her husband, and admits to eating comfort herself.   She does not drink alcohol.   Past Surgical History:  Procedure Laterality Date  . CARDIAC CATHETERIZATION  5/'01, 3/'02, 8/'03, 10/'04; 1/'15   08/22/2013: LAD & RCA 100%; LIMA-LAD & SVG-rPDA patent; Cx-- OM1 60%, OM2 ostial ~50%; SVG-D1 - 80% mid, 50% distal ISR --PCI  . CARDIAC CATHETERIZATION  5/'01, 3/'02, 8/'03, 10/'04; 1/'15   08/22/2013: LAD & RCA 100%; LIMA-LAD & SVG-rPDA patent; Cx-- OM1 60%, OM2 ostial ~50%; SVG-D1 - 80% mid, 50% distal ISR --PCI  . CATHETER REMOVAL    . CHOLECYSTECTOMY N/A 10/29/2014   Procedure: LAPAROSCOPIC CHOLECYSTECTOMY WITH INTRAOPERATIVE CHOLANGIOGRAM;  Surgeon: Excell Seltzer, MD;  Location: [facility];  Service: General;  Laterality: N/A;  . CORONARY ANGIOPLASTY  1994   x5  . CORONARY ARTERY BYPASS GRAFT  1995   LIMA-LAD, SVG-RPDA, SVG-D1  . INCISE AND DRAIN ABCESS    . KIDNEY TRANSPLANT  1991  . LAPAROSCOPIC GASTRIC BANDING  04/2004; 10/'09, 2/'10   Port Replacement x 2  . LEFT HEART  CATHETERIZATION WITH CORONARY/GRAFT ANGIOGRAM N/A 08/23/2013   Procedure: LEFT HEART CATHETERIZATION WITH Beatrix Fetters;  Surgeon: Wellington Hampshire, MD;  Location: [facility] CATH LAB;  Service: Cardiovascular;  Laterality: N/A;  . Lower Extremity Arterial Dopplers  08/2013   ABI: R 0.96, L 1.04  . MULTIPLE TOOTH EXTRACTIONS  age 69  . PERCUTANEOUS CORONARY STENT INTERVENTION (PCI-S)  5/'01, 3/'02, 8/'03, 10/'04;   '01 - S660 BMS 2.5 x 9 - dSVG-D1 into D1; '02- post-stent stenosis - 2.5 x 8 Pixel BMS; '8\03: ISR/Thrombosis into native D1 - AngioJet, 2.5 x 13 Pixel; '04 - ISR 95% - covered stented area with Taxus DES 2.5 mm x 20 (2.88)  . PERCUTANEOUS CORONARY STENT INTERVENTION (PCI-S)  08/22/2013   mid SVG-D1 80%; distal stent ~50% ISR; Promus Prermier DES 2.75 mm xc 20 mm (2.8 mm)  . PERCUTANEOUS CORONARY STENT INTERVENTION (PCI-S)  08/23/2013   Procedure: PERCUTANEOUS CORONARY STENT INTERVENTION (PCI-S);  Surgeon: Wellington Hampshire, MD;  Location: [facility] CATH LAB;  Service: Cardiovascular;;  . PORT-A-CATH REMOVAL     kidney  . TRANSTHORACIC ECHOCARDIOGRAM  02/2013   NL LV Size, EF 55-60%; MAC with Mild MR.  . TUBAL LIGATION    . wrist fistula repair Left    dialysis for one year   Past Medical History:  Diagnosis Date  . Anxiety   . Bilateral carotid artery stenosis    Carotid duplex [id number]: [id number] LICA, [id number] RICA, [id number] RECA, f/u 1 yr suggested  . CAD (coronary artery disease) of bypass graft 5/01; 3/'02, 8/'03, 10/'04; 1/15   PCI x 5 to SVG-D1   . CAD in native artery 07/1993    3 Vessel Disease (LAD-D1 & RCA) -- CABG  . CAD S/P percutaneous coronary angioplasty    PCI to SVG-D1 insertion/native D1 x 4 = '01 -(S660 BMS 2.5 x 9 - insertion into D1; '02 - distal overlap ACS Pixel 2.5 x 8  BMS; '03 distal/native ISR/Thrombosis - Pixel 2.5 x 13; '04 - ISR-  Taxus 2.5 x 20 (  covered all);; 1/15 - mid SVG-D1 (50% distal ISR) - Promus P 2.75 x 20 -- 2.8 mm  . COPD mixed type ([place])    Followed by  Dr. Lamonte Sakai "pulmonologist said no COPD"  . Depression   . Depression with anxiety   . Diabetes mellitus type 2 in obese ([place])   . Dyslipidemia, goal LDL below 70    08/2012: TC 137, TG 200, HDL 32!, LDL 45; on statin (followed by Dr.Deterding)  . Enlarged heart    "told one time"  . ESRD (end stage renal disease) ([place]) 1991   s/p Cadaveric Renal Transplant [facility] - Dr. Jimmy Footman)   . Fibromyalgia   . GERD (gastroesophageal reflux disease)   . Glomerulonephritis, chronic, rapidly progressive 84  . 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 07/1993   Rescue PTCA of RCA -- referred for CABG.  . H/O: GI bleed   . Hypertension associated with diabetes ([place])   . MRSA (methicillin resistant staph aureus) culture positive   . Obesity    s/p Lap Band 04/2004- Port Replacement 10/09 & 2/10 for infection; Dr. Excell Seltzer.  . OSA on CPAP    oxygen @night  @ 2L  . PAD (peripheral artery disease) ([place]) 08/2013   LEA Dopplers to be read by Dr. Fletcher Anon  . PAF (paroxysmal atrial fibrillation) ([place])    Noted on CardioNet Monitor 11/5-18 - rate ~112  . Recurrent boils    Bilateral Groin  . Rheumatoid arthritis ([place])    Per Patient Report; associated with OA  . 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 Servando Snare: LIMA-LAD, SVG-bifurcatingD1, SVG-rPDA  . S/p cadaver renal transplant [place]  . Stroke [facility]) 2012   "right eye stroke- half blind now"  . Unstable angina ([place]) 5/01; 3/'02, 8/'03, 10/'04; 1/15   x 5 occurences since Inf-Post STEMI in 1994   BP (!) 143/93   Pulse 83   SpO2 97%   Opioid Risk Score:   Fall Risk Score:  `1  Depression screen PHQ 2/9  No flowsheet data found.  Review of Systems  HENT: Negative.   Eyes: Negative.   Respiratory: Negative.   Cardiovascular: Negative.   Gastrointestinal: Negative.   Endocrine: Negative.   Genitourinary: Negative.   Musculoskeletal: Positive for back pain, gait problem and neck pain.  Skin: Negative.     Allergic/Immunologic: Negative.   Neurological: Positive for weakness and numbness.  Hematological: Negative.   Psychiatric/Behavioral: The patient is nervous/anxious.        Objective:   Physical Exam  General: Alert and oriented x 3, No apparent distress. She is overweight HEENT: Head is normocephalic, atraumatic, PERRLA, EOMI, sclera anicteric, oral mucosa pink and moist, dentition intact, ext ear canals clear,  Neck: Supple without JVD or lymphadenopathy Heart: Reg rate and rhythm. No murmurs rubs or gallops Chest: CTA bilaterally without wheezes, rales, or rhonchi; no distress Abdomen: Soft, non-tender, non-distended, bowel sounds positive. Extremities: No clubbing, cyanosis, or edema. Pulses are 2+ Skin: Clean and intact without signs of breakdown. Has dry skin in places. Numerous skin tags Neuro: Pt is cognitively appropriate with normal insight, memory, and awareness. Cranial nerves 2-12 are intact. Sensory exam is diminished to light touch below the knees bilaterally. Reflexes are 1-2+ in all 4's. Fine motor coordination is intact. No tremors. Motor function is grossly 5/5.  Musculoskeletal: she is tender in the neck with simple flexion and extension. I ranged her low back and she was able to bend beyond  90 degrees, extension beyond 15 degrees led to pain as did lateral bending to the left more than right. Rotation caused lumbar discomfort as well. She has no gross rheumatoid deformities in the hands or feet. She has mild pain in both shoulder with IR/ER and rotator cuff impingement maneuvers. Both knees exhibited mild effusion but minimal crepitus. Joint line pain was provoked at either knee medially and latetrally. She walks with antalgia bilaterally left more than right. Cross leg maneuver provoked pain in both hips. She has numerous tenderpoints throughout the upper and lower exts as well as the trunk.  Psych: Pt's affect is appropriate. Pt is cooperative         Assessment  & Plan:  1. Fibromyalgia 2. Rheumatoid Arthritis by history 3. Osteoarthritis--polyarticular 4. ESRD with hx of renal transplant on chronic steroids    Plan: 1. Bilateral xrays of knees and hips to discern extent of joint disease and potential bone loss 2. Initiate low dose gabapentin 100mg  TID--titrate further as needed 3. Increase cymbalta to 60mg  daily, decrease zoloft to 150mg . Would like to decrease zoloft more moving forward given high risk of serotonin syndrome. Counseling? 4. Urine was collected for UDS. Consider narcotic rx as well for pain 5. ?Rheum follow up 6. Consider formal PT vs aquatic based/low impact HEP    Forty-five minutes of face to face patient care time were spent during this visit. All questions were encouraged and answered. Return visit in a month.     I would like to thank Dr. Jimmy Footman for allowing me to participate in the care of this pleasant lady!!!!

## 2016-03-30 NOTE — Patient Instructions (Signed)
ONCE I HAVE CONFIRMATION THAT YOUR URINE SPECIMEN IS CONSISTENT WITH YOUR HISTORY AND PRESCRIBED MEDICATIONS, I WILL BE WILLING TO PRESCRIBE YOUR PAIN MEDICATION. THE RESULTS OF YOUR URINE TESTING COULD TAKE A WEEK OR MORE TO RETURN, HOWEVER.  IF WE DO NOT CONTACT YOU REGARDING THESE RESULTS WITHIN 10 DAYS, PLEASE CONTACT us.    PLEASE CALL ME WITH ANY PROBLEMS OR QUESTIONS [phone])

## 2016-04-01 ENCOUNTER — Ambulatory Visit
Admission: RE | Admit: 2016-04-01 | Discharge: 2016-04-01 | Disposition: A | Discharge status: Home / Self Care | Payer: Medicare Other | Source: Ambulatory Visit | Attending: Physician Assistant | Admitting: Physician Assistant

## 2016-04-01 ENCOUNTER — Telehealth: Payer: Self-pay | Admitting: *Deleted

## 2016-04-01 ENCOUNTER — Ambulatory Visit
Admission: RE | Admit: 2016-04-01 | Discharge: 2016-04-01 | Disposition: A | Discharge status: Home / Self Care | Payer: Medicare Other | Source: Ambulatory Visit | Attending: Physical Medicine & Rehabilitation | Admitting: Physical Medicine & Rehabilitation

## 2016-04-01 DIAGNOSIS — R05 Cough: Secondary | ICD-10-CM | POA: Diagnosis not present

## 2016-04-01 DIAGNOSIS — M25562 Pain in left knee: Principal | ICD-10-CM

## 2016-04-01 DIAGNOSIS — M25552 Pain in left hip: Principal | ICD-10-CM

## 2016-04-01 DIAGNOSIS — M25551 Pain in right hip: Secondary | ICD-10-CM

## 2016-04-01 DIAGNOSIS — M25561 Pain in right knee: Secondary | ICD-10-CM

## 2016-04-01 DIAGNOSIS — R059 Cough, unspecified: Secondary | ICD-10-CM

## 2016-04-01 DIAGNOSIS — M160 Bilateral primary osteoarthritis of hip: Secondary | ICD-10-CM | POA: Diagnosis not present

## 2016-04-01 DIAGNOSIS — M179 Osteoarthritis of knee, unspecified: Secondary | ICD-10-CM | POA: Diagnosis not present

## 2016-04-01 NOTE — Telephone Encounter (Signed)
Order for hip needs to be changed to bil hip min 5 views.  done

## 2016-04-02 ENCOUNTER — Telehealth: Payer: Self-pay | Admitting: Physical Medicine & Rehabilitation

## 2016-04-02 NOTE — Telephone Encounter (Signed)
Patient was her on Monday and Dr. Naaman Plummer started her on Neurontin.  She has developed a rash and blisters on her feet.  She called her pharmacy and they told her to call out office to let us know.  Please call patient at [phone].

## 2016-04-02 NOTE — Telephone Encounter (Signed)
Please advise 

## 2016-04-03 NOTE — Telephone Encounter (Signed)
Please stop the neurontin. If rash resolves then we know it's the medication. If not, she has to look at other causes.

## 2016-04-03 NOTE — Telephone Encounter (Signed)
She stopped it on advice from pharmacist and it is clearing up. She is asking for something else for pain. She said it was working but cannot take the side effects.

## 2016-04-06 NOTE — Telephone Encounter (Signed)
Can you ask her if she's been on lyrica? I seem to think we discussed it at her initial OV, but I don't see any documentation. If not, I would recommend starting at 25mg  QHS for 3 days then increasing to 25mg  BID thereafter. We can titrate further as needed/tolerated. Thanks.  Could rx 25mg  #60 3RF

## 2016-04-07 ENCOUNTER — Telehealth: Payer: Self-pay | Admitting: Physical Medicine & Rehabilitation

## 2016-04-07 LAB — TOXASSURE SELECT,+ANTIDEPR,UR: PDF: 0

## 2016-04-07 MED ORDER — CARBAMAZEPINE 200 MG PO TABS: ORAL_TABLET | ORAL | 2 refills | Status: DC

## 2016-04-07 NOTE — Addendum Note (Signed)
Addended by: Caro Hight on: 04/07/2016 03:38 PM   Modules accepted: Orders

## 2016-04-07 NOTE — Telephone Encounter (Signed)
Patient is returning Sybil's call.  She stated she doesn't think she has taken Lyrica.  She also would like to know about her Xray results.

## 2016-04-07 NOTE — Telephone Encounter (Signed)
Tegretol sent to pharmacy. (she did call back and say she hasnt tried lyrica so it will be next option if tegretol doesn't work. She was asking about her xrays.  I told her she has osteo arthritis but if anything different please advise.[name] KitchenMarland Kitchen

## 2016-04-07 NOTE — Telephone Encounter (Signed)
Let's try tegretol 200mg  qhs for 5 days then 200mg  bid #60, 2RF

## 2016-04-07 NOTE — Telephone Encounter (Signed)
She says that she has had lyrica and it didn't help.  She wishes she could take the neurontin as it was really helping.

## 2016-04-08 NOTE — Progress Notes (Signed)
Urine drug screen for this encounter is consistent for prescribed medications.   

## 2016-04-14 DIAGNOSIS — I2510 Atherosclerotic heart disease of native coronary artery without angina pectoris: Secondary | ICD-10-CM | POA: Diagnosis not present

## 2016-04-14 DIAGNOSIS — N2581 Secondary hyperparathyroidism of renal origin: Secondary | ICD-10-CM | POA: Diagnosis not present

## 2016-04-14 DIAGNOSIS — E118 Type 2 diabetes mellitus with unspecified complications: Secondary | ICD-10-CM | POA: Diagnosis not present

## 2016-04-14 DIAGNOSIS — D631 Anemia in chronic kidney disease: Secondary | ICD-10-CM | POA: Diagnosis not present

## 2016-04-14 DIAGNOSIS — I639 Cerebral infarction, unspecified: Secondary | ICD-10-CM | POA: Diagnosis not present

## 2016-04-14 DIAGNOSIS — I4891 Unspecified atrial fibrillation: Secondary | ICD-10-CM | POA: Diagnosis not present

## 2016-04-14 DIAGNOSIS — E785 Hyperlipidemia, unspecified: Secondary | ICD-10-CM | POA: Diagnosis not present

## 2016-04-14 DIAGNOSIS — D509 Iron deficiency anemia, unspecified: Secondary | ICD-10-CM | POA: Diagnosis not present

## 2016-04-14 DIAGNOSIS — I129 Hypertensive chronic kidney disease with stage 1 through stage 4 chronic kidney disease, or unspecified chronic kidney disease: Secondary | ICD-10-CM | POA: Diagnosis not present

## 2016-04-14 DIAGNOSIS — G4733 Obstructive sleep apnea (adult) (pediatric): Secondary | ICD-10-CM | POA: Diagnosis not present

## 2016-04-14 DIAGNOSIS — Z940 Kidney transplant status: Secondary | ICD-10-CM | POA: Diagnosis not present

## 2016-04-14 DIAGNOSIS — I739 Peripheral vascular disease, unspecified: Secondary | ICD-10-CM | POA: Diagnosis not present

## 2016-04-14 DIAGNOSIS — N182 Chronic kidney disease, stage 2 (mild): Secondary | ICD-10-CM | POA: Diagnosis not present

## 2016-04-29 ENCOUNTER — Ambulatory Visit: Payer: Medicare Other | Admitting: Physical Medicine & Rehabilitation

## 2016-05-01 ENCOUNTER — Other Ambulatory Visit: Payer: Self-pay | Admitting: Emergency Medicine

## 2016-05-08 ENCOUNTER — Telehealth: Payer: Self-pay | Admitting: Emergency Medicine

## 2016-05-08 ENCOUNTER — Ambulatory Visit: Payer: Medicare Other | Admitting: Adult Health

## 2016-05-08 ENCOUNTER — Emergency Department (HOSPITAL_COMMUNITY)
Admission: EM | Admit: 2016-05-08 | Discharge: 2016-05-08 | Disposition: A | Discharge status: Home / Self Care | Payer: Medicare Other | Attending: Emergency Medicine | Admitting: Emergency Medicine

## 2016-05-08 ENCOUNTER — Encounter (HOSPITAL_COMMUNITY): Payer: Self-pay | Admitting: Emergency Medicine

## 2016-05-08 ENCOUNTER — Emergency Department (HOSPITAL_COMMUNITY): Payer: Medicare Other

## 2016-05-08 DIAGNOSIS — R0602 Shortness of breath: Secondary | ICD-10-CM | POA: Diagnosis not present

## 2016-05-08 DIAGNOSIS — R0789 Other chest pain: Secondary | ICD-10-CM | POA: Diagnosis not present

## 2016-05-08 DIAGNOSIS — N186 End stage renal disease: Secondary | ICD-10-CM | POA: Insufficient documentation

## 2016-05-08 DIAGNOSIS — Z87891 Personal history of nicotine dependence: Secondary | ICD-10-CM | POA: Diagnosis not present

## 2016-05-08 DIAGNOSIS — E1122 Type 2 diabetes mellitus with diabetic chronic kidney disease: Secondary | ICD-10-CM | POA: Diagnosis not present

## 2016-05-08 DIAGNOSIS — Z951 Presence of aortocoronary bypass graft: Secondary | ICD-10-CM | POA: Diagnosis not present

## 2016-05-08 DIAGNOSIS — J449 Chronic obstructive pulmonary disease, unspecified: Secondary | ICD-10-CM | POA: Diagnosis not present

## 2016-05-08 DIAGNOSIS — R079 Chest pain, unspecified: Secondary | ICD-10-CM | POA: Diagnosis not present

## 2016-05-08 DIAGNOSIS — I2510 Atherosclerotic heart disease of native coronary artery without angina pectoris: Secondary | ICD-10-CM | POA: Insufficient documentation

## 2016-05-08 DIAGNOSIS — Z79899 Other long term (current) drug therapy: Secondary | ICD-10-CM | POA: Diagnosis not present

## 2016-05-08 DIAGNOSIS — Z8673 Personal history of transient ischemic attack (TIA), and cerebral infarction without residual deficits: Secondary | ICD-10-CM | POA: Diagnosis not present

## 2016-05-08 DIAGNOSIS — R05 Cough: Secondary | ICD-10-CM | POA: Diagnosis not present

## 2016-05-08 LAB — BASIC METABOLIC PANEL
Anion gap: 10 (ref 5–15)
BUN: 9 mg/dL (ref 6–20)
CO2: 26 mmol/L (ref 22–32)
Calcium: 9.9 mg/dL (ref 8.9–10.3)
Chloride: 104 mmol/L (ref 101–111)
Creatinine, Ser: 0.91 mg/dL (ref 0.44–1.00)
GFR calc Af Amer: >60 mL/min (ref >60)
GFR calc non Af Amer: >60 mL/min (ref >60)
Glucose, Bld: 163 mg/dL — ABNORMAL HIGH (ref 65–99)
Potassium: 3.4 mmol/L — ABNORMAL LOW (ref 3.5–5.1)
Sodium: 140 mmol/L (ref 135–145)

## 2016-05-08 LAB — CBC WITH DIFFERENTIAL/PLATELET
Basophils Absolute: 0 10*3/uL (ref 0.0–0.1)
Basophils Relative: 0 %
Eosinophils Absolute: 0.2 10*3/uL (ref 0.0–0.7)
Eosinophils Relative: 2 %
HCT: 37.4 % (ref 36.0–46.0)
Hemoglobin: 11.6 g/dL — ABNORMAL LOW (ref 12.0–15.0)
Lymphocytes Relative: 16 %
Lymphs Abs: 1.2 10*3/uL (ref 0.7–4.0)
MCH: 29.1 pg (ref 26.0–34.0)
MCHC: 31 g/dL (ref 30.0–36.0)
MCV: 93.7 fL (ref 78.0–100.0)
Monocytes Absolute: 0.4 10*3/uL (ref 0.1–1.0)
Monocytes Relative: 5 %
Neutro Abs: 5.9 10*3/uL (ref 1.7–7.7)
Neutrophils Relative %: 77 %
Platelets: 259 10*3/uL (ref 150–400)
RBC: 3.99 MIL/uL (ref 3.87–5.11)
RDW: 15.7 % — ABNORMAL HIGH (ref 11.5–15.5)
WBC: 7.6 10*3/uL (ref 4.0–10.5)

## 2016-05-08 LAB — TROPONIN I: Troponin I: <0.03 ng/mL (ref <0.03)

## 2016-05-08 MED ORDER — OXYCODONE-ACETAMINOPHEN 5-325 MG PO TABS
1.0000 | ORAL_TABLET | Freq: Once | ORAL | Status: AC
  Administered 2016-05-08: 1 via ORAL
  Filled 2016-05-08: qty 1

## 2016-05-08 MED ORDER — LEVOFLOXACIN 500 MG PO TABS: 500.0000 mg | ORAL_TABLET | Freq: Every day | ORAL | 0 refills | Status: DC

## 2016-05-08 MED ORDER — OXYCODONE-ACETAMINOPHEN 5-325 MG PO TABS: 1.0000 | ORAL_TABLET | Freq: Four times a day (QID) | ORAL | 0 refills | Status: DC | PRN

## 2016-05-08 MED ORDER — BUDESONIDE 0.25 MG/2ML IN SUSP: 0.2500 mg | Freq: Two times a day (BID) | RESPIRATORY_TRACT | 2 refills | Status: DC

## 2016-05-08 NOTE — ED Provider Notes (Signed)
[facility] DEPT Provider Note   CSN: [id number] Arrival date & time: 05/08/16  1017  By signing my name below, I, Rayna Sexton, attest that this documentation has been prepared under the direction and in the presence of Virgel Manifold, MD. Electronically Signed: Rayna Sexton, ED Scribe. 05/08/16. 10:42 AM.   History   Chief Complaint Chief Complaint  Patient presents with  . Chest Pain  . Cough    HPI HPI Comments: Katherine [name] is a 60 y.o. female with an extensive medical history listed below who presents to the Emergency Department by ambulance and given 324 ASA and 1 NTG en-route complaining of stabbing, worsening, constant, CP x 4 days. She reports associated upper back pain and states it feels as if someone is "stabbing her through the back with a stick towards the front of her chest". She states her back pain this morning began radiating down her BLEs. Pt states she has experienced bruising across her back overlying her back pain but denies any recent falls or trauma. Her pain worsens with deep breathing and denies it worsens with changes in position. Pt states she has been experiencing associated cold like symptoms including chest congestion, cough, fatigue and SOB. Pt has applied heat to her chest which provides mild relief and has used a rx inhaler and taken Nyquil w/o relief. She has a h/o left kidney transplant and a lap band in place. She denies leg swelling.   The history is provided by the patient. No language interpreter was used.    Past Medical History:  Diagnosis Date  . Anxiety   . Bilateral carotid artery stenosis    Carotid duplex 11/1960: 2-29% LICA, 79-89% RICA, >21% RECA, f/u 1 yr suggested  . CAD (coronary artery disease) of bypass graft 5/01; 3/'02, 8/'03, 10/'04; 1/15   PCI x 5 to SVG-D1   . CAD in native artery 07/1993    3 Vessel Disease (LAD-D1 & RCA) -- CABG  . CAD S/P percutaneous coronary angioplasty    PCI to SVG-D1 insertion/native D1 x  4 = '01 -(S660 BMS 2.5 x 9 - insertion into D1; '02 - distal overlap ACS Pixel 2.5 x 8  BMS; '03 distal/native ISR/Thrombosis - Pixel 2.5 x 13; '04 - ISR-  Taxus 2.5 x 20 (covered all);; 1/15 - mid SVG-D1 (50% distal ISR) - Promus P 2.75 x 20 -- 2.8 mm  . COPD mixed type ([place])    Followed by Dr. Lamonte Sakai "pulmonologist said no COPD"  . Depression   . Depression with anxiety   . Diabetes mellitus type 2 in obese ([place])   . Dyslipidemia, goal LDL below 70    08/2012: TC 137, TG 200, HDL 32!, LDL 45; on statin (followed by Dr.Deterding)  . Enlarged heart    "told one time"  . ESRD (end stage renal disease) ([place]) 1991   s/p Cadaveric Renal Transplant [facility] - Dr. Jimmy Footman)   . Fibromyalgia   . GERD (gastroesophageal reflux disease)   . Glomerulonephritis, chronic, rapidly progressive 19  . 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 07/1993   Rescue PTCA of RCA -- referred for CABG.  . H/O: GI bleed   . Hypertension associated with diabetes ([place])   . MRSA (methicillin resistant staph aureus) culture positive   . Obesity    s/p Lap Band 04/2004- Port Replacement 10/09 & 2/10 for infection; Dr. Excell Seltzer.  . OSA on CPAP    oxygen @night  @ 2L  . PAD (peripheral  artery disease) ([place]) 08/2013   LEA Dopplers to be read by Dr. Fletcher Anon  . PAF (paroxysmal atrial fibrillation) ([place])    Noted on CardioNet Monitor 11/5-18 - rate ~112  . Recurrent boils    Bilateral Groin  . Rheumatoid arthritis ([place])    Per Patient Report; associated with OA  . 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 Servando Snare: LIMA-LAD, SVG-bifurcatingD1, SVG-rPDA  . S/p cadaver renal transplant [place]  . Stroke [facility]) 2012   "right eye stroke- half blind now"  . Unstable angina ([place]) 5/01; 3/'02, 8/'03, 10/'04; 1/15   x 5 occurences since Inf-Post STEMI in 1994    Patient Active Problem List   Diagnosis Date Noted  . Hip pain, bilateral 03/30/2016  . Bilateral knee pain 03/30/2016  . Fibromyalgia 03/30/2016    . Spondylosis of lumbar region without myelopathy or radiculopathy 03/30/2016  . ESRD (end stage renal disease) ([place]) 03/30/2016  . Type 2 diabetes mellitus with peripheral neuropathy ([place]) 03/30/2016  . Acute bronchitis 02/05/2016  . Bright red rectal bleeding 08/03/2014  . PAF (paroxysmal atrial fibrillation) ([place]) 06/28/2014  . Weakness 06/28/2014  . Palpitations 06/21/2014  . Frequent PVCs 06/21/2014  . Fatigue 06/21/2014  . CAD in native artery 09/11/2013  . CAD S/P percutaneous coronary angioplasty - PCI x 5 to SVG-D1 09/11/2013    Class: Diagnosis of  . Hypertension associated with diabetes ([name])   . Chest pain 08/23/2013  . PAD (peripheral artery disease) ([place]) 08/17/2013  . Atherosclerosis of coronary artery bypass graft without angina pectoris 10/08/2012  . Stenosis of right carotid artery without infarction 10/08/2012  . Dyslipidemia, goal LDL below 70 10/08/2012    Class: Diagnosis of  . Mitral annular calcification 10/08/2012  . Leukocytosis 06/13/2012  . Hypomagnesemia 06/13/2012  . Hypokalemia 06/12/2012  . Fever 06/12/2012  . PNA (pneumonia) 06/12/2012  . Cellulitis 06/12/2012  . Renal transplant disorder 06/12/2012  . Chronic allergic rhinitis 04/29/2011  . VIRAL URI 03/14/2008  . OTITIS EXTERNA, ACUTE 09/12/2007  . Sleep apnea 09/09/2007  . Extrinsic asthma 09/09/2007  . SINUSITIS, CHRONIC 09/09/2007  . GERD 09/09/2007  . COUGH, CHRONIC 09/09/2007  . Morbid obesity - s/p Lap Band 9/'05 05/07/2004    Class: Diagnosis of  . S/P CABG (coronary artery bypass graft) x 3 09/07/1993    Class: History of  . ST elevation myocardial infarction (STEMI) of inferoposterior wall; H/o -- Rescue PTCA of RCA -- referred for CABG 08/07/1993    Class: History of    Past Surgical History:  Procedure Laterality Date  . CARDIAC CATHETERIZATION  5/'01, 3/'02, 8/'03, 10/'04; 1/'15   08/22/2013: LAD & RCA 100%; LIMA-LAD & SVG-rPDA patent; Cx-- OM1 60%, OM2 ostial ~50%; SVG-D1  - 80% mid, 50% distal ISR --PCI  . CARDIAC CATHETERIZATION  5/'01, 3/'02, 8/'03, 10/'04; 1/'15   08/22/2013: LAD & RCA 100%; LIMA-LAD & SVG-rPDA patent; Cx-- OM1 60%, OM2 ostial ~50%; SVG-D1 - 80% mid, 50% distal ISR --PCI  . CATHETER REMOVAL    . CHOLECYSTECTOMY N/A 10/29/2014   Procedure: LAPAROSCOPIC CHOLECYSTECTOMY WITH INTRAOPERATIVE CHOLANGIOGRAM;  Surgeon: Excell Seltzer, MD;  Location: [facility] ORS;  Service: General;  Laterality: N/A;  . CORONARY ANGIOPLASTY  1994   x5  . CORONARY ARTERY BYPASS GRAFT  1995   LIMA-LAD, SVG-RPDA, SVG-D1  . INCISE AND DRAIN ABCESS    . KIDNEY TRANSPLANT  1991  . LAPAROSCOPIC GASTRIC BANDING  04/2004; 10/'09, 2/'10   Port Replacement x 2  .  LEFT HEART CATHETERIZATION WITH CORONARY/GRAFT ANGIOGRAM N/A 08/23/2013   Procedure: LEFT HEART CATHETERIZATION WITH Beatrix Fetters;  Surgeon: Wellington Hampshire, MD;  Location: [facility] CATH LAB;  Service: Cardiovascular;  Laterality: N/A;  . Lower Extremity Arterial Dopplers  08/2013   ABI: R 0.96, L 1.04  . MULTIPLE TOOTH EXTRACTIONS  age 60  . PERCUTANEOUS CORONARY STENT INTERVENTION (PCI-S)  5/'01, 3/'02, 8/'03, 10/'04;   '01 - S660 BMS 2.5 x 9 - dSVG-D1 into D1; '02- post-stent stenosis - 2.5 x 8 Pixel BMS; '8\03: ISR/Thrombosis into native D1 - AngioJet, 2.5 x 13 Pixel; '04 - ISR 95% - covered stented area with Taxus DES 2.5 mm x 20 (2.88)  . PERCUTANEOUS CORONARY STENT INTERVENTION (PCI-S)  08/22/2013   mid SVG-D1 80%; distal stent ~50% ISR; Promus [place] DES 2.75 mm xc 20 mm (2.8 mm)  . PERCUTANEOUS CORONARY STENT INTERVENTION (PCI-S)  08/23/2013   Procedure: PERCUTANEOUS CORONARY STENT INTERVENTION (PCI-S);  Surgeon: Wellington Hampshire, MD;  Location: [facility] LAB;  Service: Cardiovascular;;  . PORT-A-CATH REMOVAL     kidney  . TRANSTHORACIC ECHOCARDIOGRAM  02/2013   NL LV Size, EF 55-60%; MAC with Mild MR.  . TUBAL LIGATION    . wrist fistula repair Left    dialysis for one year    OB History    No data  available       Home Medications    Prior to Admission medications   Medication Sig Start Date End Date Taking? Authorizing Provider  acetaminophen (TYLENOL) 500 MG tablet Take 1,500 mg by mouth every 6 (six) hours as needed for moderate pain or headache.    Historical Provider, MD  azaTHIOprine (IMURAN) 50 MG tablet Take 125 mg by mouth every evening.     Historical Provider, MD  calcitRIOL (ROCALTROL) 0.25 MCG capsule Take 0.25 mcg by mouth every morning.     Historical Provider, MD  carbamazepine (TEGRETOL) 200 MG tablet Take 200 mg q hs for one week the bid thereafter. 04/07/16   Meredith Staggers, MD  clopidogrel (PLAVIX) 75 MG tablet TAKE 1 TABLET (75 MG TOTAL) BY MOUTH DAILY. 02/11/16   [name], MD  cyclobenzaprine (FLEXERIL) 10 MG tablet Take 10 mg by mouth at bedtime.     Historical Provider, MD  diltiazem (CARDIZEM CD) 120 MG 24 hr capsule TAKE 1 CAPSULE (120 MG TOTAL) BY MOUTH DAILY. 08/21/15   [name], MD  DULoxetine (CYMBALTA) 30 MG capsule Take 2 capsules (60 mg total) by mouth every morning. 03/30/16   Meredith Staggers, MD  fluticasone (FLONASE) 50 MCG/ACT nasal spray Place 1 spray into both nostrils as directed.    Historical Provider, MD  fluticasone (FLONASE) 50 MCG/ACT nasal spray PLACE 2 SPRAYS INTO THE NOSE DAILY 05/01/16   Collene Gobble, MD  gabapentin (NEURONTIN) 100 MG capsule Take 1 capsule (100 mg total) by mouth 3 (three) times daily. 03/30/16   Meredith Staggers, MD  LORazepam (ATIVAN) 1 MG tablet Take 1 mg by mouth every morning.     Historical Provider, MD  metoprolol tartrate (LOPRESSOR) 25 MG tablet Take half tablet (12.5 mg total) by mouth daily. Take whole tablet (25 mg total) by mouth daily if SBP > 150. 02/20/16   Eileen Stanford, PA-C  montelukast (SINGULAIR) 10 MG tablet TAKE 1 TABLET (10 MG TOTAL) BY MOUTH DAILY. 12/13/15   Collene Gobble, MD  nitroGLYCERIN (NITROSTAT) 0.4 MG SL tablet Place 1 tablet (0.4 mg total)  under the tongue every 5 (five)  minutes as needed for chest pain. 09/11/13   [name], MD  omeprazole (PRILOSEC) 40 MG capsule Take 40 mg by mouth every morning.     Historical Provider, MD  predniSONE (DELTASONE) 5 MG tablet Take 5 mg by mouth every morning.     Historical Provider, MD  promethazine (PHENERGAN) 25 MG tablet Take 25 mg by mouth at bedtime as needed for nausea or vomiting. For nausea and sleep    Historical Provider, MD  rosuvastatin (CRESTOR) 20 MG tablet TAKE 1 TABLET (20 MG TOTAL) BY MOUTH DAILY. 03/16/16   [name], MD  sertraline (ZOLOFT) 100 MG tablet Take 150 mg by mouth every morning.    Historical Provider, MD  VENTOLIN HFA 108 (90 Base) MCG/ACT inhaler INHALE 2 PUFFS INTO THE LUNGS EVERY 6 (SIX) HOURS AS NEEDED FOR WHEEZING OR SHORTNESS OF BREATH. 03/23/16   Collene Gobble, MD    Family History Family History  Problem Relation Age of Onset  . Cancer Mother     liver  . Heart disease Father   . Cancer Father     colon  . Arrhythmia Brother     Atrial Fibrillation  . Arrhythmia Paternal Aunt     Atrial Fibrillation    Social History Social History  Substance Use Topics  . Smoking status: Former Smoker    Packs/day: 1.00    Years: 30.00    Types: Cigarettes    Quit date: 08/17/2002  . Smokeless tobacco: Never Used  . Alcohol use No     Allergies   Codeine; Erythromycin; Hydromorphone hcl; Morphine and related; Nalbuphine; Niaspan [niacin er]; Sulfonamide derivatives; and Tetracycline   Review of Systems Review of Systems  Constitutional: Positive for fatigue.  HENT: Positive for congestion and postnasal drip.   Respiratory: Positive for cough and shortness of breath.   Cardiovascular: Positive for chest pain. Negative for leg swelling.  Musculoskeletal: Positive for back pain and myalgias.  Skin: Positive for color change.  All other systems reviewed and are negative.  Physical Exam Updated Vital Signs BP 129/87 (BP Location: Right Arm)   Pulse 88   Temp 97.9 F  (36.6 C) (Oral)   Resp 18   SpO2 98%   Physical Exam  Constitutional: She is oriented to person, place, and time. She appears well-developed and well-nourished. No distress.  HENT:  Head: Normocephalic and atraumatic.  Eyes: EOM are normal.  Neck: Normal range of motion.  Cardiovascular: Normal rate, regular rhythm and normal heart sounds.   Pulmonary/Chest: Effort normal and breath sounds normal. She exhibits no tenderness.  Abdominal: Soft. She exhibits no distension. There is no tenderness.  Musculoskeletal: Normal range of motion.  Lower extremities symmetric as compared to each other. No calf tenderness. Negative Homan's. No palpable cords.   Neurological: She is alert and oriented to person, place, and time.  Skin: Skin is warm and dry.  Psychiatric: She has a normal mood and affect. Judgment normal.  Nursing note and vitals reviewed.  ED Treatments / Results  Labs (all labs ordered are listed, but only abnormal results are displayed) Labs Reviewed - No data to display  EKG  EKG Interpretation  Date/Time:  Friday May 08 2016 10:21:22 EDT Ventricular Rate:  91 PR Interval:    QRS Duration: 89 QT Interval:  361 QTC Calculation: 445 R Axis:   -11 Text Interpretation:  Sinus rhythm Abnormal R-wave progression, early transition LVH with secondary repolarization abnormality  Baseline wander in lead(s) I II aVR aVL Confirmed by Wilson Singer  MD, Jackqueline Aquilar [phone]) on 05/08/2016 10:33:42 AM       Radiology No results found.   Dg Chest 2 View  Result Date: 05/08/2016 CLINICAL DATA:  Shortness of breath. Cough. Hypertension. Diabetes. EXAM: CHEST  2 VIEW COMPARISON:  04/01/2016 FINDINGS: Prior CABG. Prior coronary stents. Borderline enlargement of the cardiopericardial silhouette stable scarring in the left upper lobe. Stable mild interstitial accentuation. Mild central airway thickening. Dense calcification of the mitral valve. Lap band noted, 2 o'clock-8 o'clock orientation.  Clips in the right upper quadrant. Mild thoracic spondylosis. No pleural effusion. IMPRESSION: 1. Airway thickening is present, suggesting bronchitis or reactive airways disease. 2. Borderline enlargement of the cardiopericardial silhouette. Prior CABG. 3. Lap band in place is noted above. Electronically Signed   By: Van Clines M.D.   On: 05/08/2016 11:45    Procedures Procedures  DIAGNOSTIC STUDIES: Oxygen Saturation is 98% on RA, normal by my interpretation.    COORDINATION OF CARE: 10:41 AM Discussed next steps with pt. Pt verbalized understanding and is agreeable with the plan.    Medications Ordered in ED Medications - No data to display   Initial Impression / Assessment and Plan / ED Course  I have reviewed the triage vital signs and the nursing notes.  Pertinent labs & imaging results that were available during my care of the patient were reviewed by me and considered in my medical decision making (see chart for details).  Clinical Course   59yF with CP. Doubt ACS, PE, dissection or other emergent process.   Final Clinical Impressions(s) / ED Diagnoses   Final diagnoses:  Atypical chest pain    New Prescriptions New Prescriptions   No medications on file     Virgel Manifold, MD 05/10/16 1920

## 2016-05-08 NOTE — Telephone Encounter (Signed)
Pt requesting asap refill on budesonide.  This has been sent to preferred pharmacy.  Nothing further needed.

## 2016-05-08 NOTE — ED Notes (Signed)
Pt ambulatory to wheelchair prior to departure, NAD. Verbalizes understanding of discharge instructions.

## 2016-05-08 NOTE — ED Triage Notes (Signed)
Pt to ER BIB [facility] from home for evaluation of right chest pain radiating into back x 1 week, worsening. Pt reports cough, congestion x2 weeks. Pain is worsened with deep breathing. Was given 324 mg aspirin in route due to significant cardiac history, as well as 1 nitro which did not relieve pain. VSS. A/o x4. RR unlabored, skin warm and dry.

## 2016-05-12 ENCOUNTER — Telehealth: Payer: Self-pay | Admitting: Emergency Medicine

## 2016-05-12 NOTE — Telephone Encounter (Signed)
Spoke with [facility], she states that Budesonide is covered under Part B with $28.62 co-pay. It is covered under her Part D but does need a PA. Spoke with pt and advised. She does not want to pay higher co-pay and would like to have PA done. Called pharmacy and advised. They gave phone number for PA @ [place] [phone].  Spoke with Katherine [name] and reviewed clinical information. They have DENIED PA for Budesonide under Part D. They will cover under Part B.  Spoke with pt and advised. She is very unhappy with decision and wanted phone number for [facility]. Pt given phone number. She states that she will call them and they will have to deal with her.   Nothing further needed at this time.

## 2016-05-14 ENCOUNTER — Ambulatory Visit: Payer: Medicare Other | Admitting: Emergency Medicine

## 2016-05-14 ENCOUNTER — Telehealth: Payer: Self-pay | Admitting: Emergency Medicine

## 2016-05-14 NOTE — Telephone Encounter (Signed)
See phone note 05/12/16: Spoke with [facility], she states that Budesonide is covered under Part B with $28.62 co-pay. It is covered under her Part D but does need a PA. Spoke with pt and advised. She does not want to pay higher co-pay and would like to have PA done. Called pharmacy and advised. They gave phone number for PA @ [place] [phone].  Spoke with Stanton Kidney and reviewed clinical information. They have DENIED PA for Budesonide under Part D. They will cover under Part B.  Spoke with pt and advised. She is very unhappy with decision and wanted phone number for [facility]. Pt given phone number. She states that she will call them and they will have to deal with her.  Nothing further needed at this time. ----  Called spoke w/ pt. She reports she can not afford $28.62 co pay each month for the budesonide. She reports she was told bu the pharmacists that the name brand pulmicort is covered at $3 per month.  I called [facility] and spoke with Ebony Hail. She tried to run Rx for [facility] pulmicort and is getting rejected stating it needs to be generic. Another pharmacists over heard Victor speaking. The pt did call them and a test claim was for pulmicort flexhaler 180 mcg that would be $3 per month.  Please advise RB thanks

## 2016-05-15 MED ORDER — BUDESONIDE 180 MCG/ACT IN AEPB: 2.0000 | INHALATION_SPRAY | Freq: Two times a day (BID) | RESPIRATORY_TRACT | 5 refills | Status: DC

## 2016-05-15 NOTE — Telephone Encounter (Signed)
Per RB- ok to switch from budesonide to pulmicort flexhaler if pt is ok with using inhaler. Spoke with pt, she is aware that this is an inhaler and is ok with this rx change.  rx sent to pharmacy.  Nothing further needed.

## 2016-05-19 ENCOUNTER — Encounter: Payer: Self-pay | Admitting: Physical Medicine & Rehabilitation

## 2016-05-19 ENCOUNTER — Encounter: Payer: Medicare Other | Attending: Physical Medicine & Rehabilitation | Admitting: Physical Medicine & Rehabilitation

## 2016-05-19 VITALS — BP 137/80 | HR 78

## 2016-05-19 DIAGNOSIS — N182 Chronic kidney disease, stage 2 (mild): Secondary | ICD-10-CM | POA: Diagnosis not present

## 2016-05-19 DIAGNOSIS — M159 Polyosteoarthritis, unspecified: Secondary | ICD-10-CM | POA: Diagnosis not present

## 2016-05-19 DIAGNOSIS — M47816 Spondylosis without myelopathy or radiculopathy, lumbar region: Secondary | ICD-10-CM

## 2016-05-19 DIAGNOSIS — Z951 Presence of aortocoronary bypass graft: Secondary | ICD-10-CM | POA: Insufficient documentation

## 2016-05-19 DIAGNOSIS — Z7952 Long term (current) use of systemic steroids: Secondary | ICD-10-CM | POA: Diagnosis not present

## 2016-05-19 DIAGNOSIS — I6521 Occlusion and stenosis of right carotid artery: Secondary | ICD-10-CM

## 2016-05-19 DIAGNOSIS — M797 Fibromyalgia: Secondary | ICD-10-CM | POA: Diagnosis not present

## 2016-05-19 DIAGNOSIS — I2510 Atherosclerotic heart disease of native coronary artery without angina pectoris: Secondary | ICD-10-CM | POA: Insufficient documentation

## 2016-05-19 DIAGNOSIS — Z940 Kidney transplant status: Secondary | ICD-10-CM | POA: Insufficient documentation

## 2016-05-19 DIAGNOSIS — F419 Anxiety disorder, unspecified: Secondary | ICD-10-CM | POA: Insufficient documentation

## 2016-05-19 DIAGNOSIS — I120 Hypertensive chronic kidney disease with stage 5 chronic kidney disease or end stage renal disease: Secondary | ICD-10-CM | POA: Insufficient documentation

## 2016-05-19 DIAGNOSIS — N186 End stage renal disease: Secondary | ICD-10-CM | POA: Insufficient documentation

## 2016-05-19 DIAGNOSIS — M7581 Other shoulder lesions, right shoulder: Secondary | ICD-10-CM | POA: Diagnosis not present

## 2016-05-19 DIAGNOSIS — E1122 Type 2 diabetes mellitus with diabetic chronic kidney disease: Secondary | ICD-10-CM | POA: Insufficient documentation

## 2016-05-19 MED ORDER — CARBAMAZEPINE 200 MG PO TABS: ORAL_TABLET | ORAL | 2 refills | Status: DC

## 2016-05-19 MED ORDER — OXYCODONE HCL 5 MG PO TABS: 5.0000 mg | ORAL_TABLET | Freq: Three times a day (TID) | ORAL | 0 refills | Status: DC | PRN

## 2016-05-19 NOTE — Patient Instructions (Signed)
Impingement Syndrome, Rotator Cuff, Bursitis With Rehab  Impingement syndrome is a condition that involves inflammation of the tendons of the rotator cuff and the subacromial bursa, that causes pain in the shoulder. The rotator cuff consists of four tendons and muscles that control much of the shoulder and upper arm function. The subacromial bursa is a fluid filled sac that helps reduce friction between the rotator cuff and one of the bones of the shoulder (acromion). Impingement syndrome is usually an overuse injury that causes swelling of the bursa (bursitis), swelling of the tendon (tendonitis), and/or a tear of the tendon (strain). Strains are classified into three categories. Grade 1 strains cause pain, but the tendon is not lengthened. Grade 2 strains include a lengthened ligament, due to the ligament being stretched or partially ruptured. With grade 2 strains there is still function, although the function may be decreased. Grade 3 strains include a complete tear of the tendon or muscle, and function is usually impaired.  SYMPTOMS   · Pain around the shoulder, often at the outer portion of the upper arm.  · Pain that gets worse with shoulder function, especially when reaching overhead or lifting.  · Sometimes, aching when not using the arm.  · Pain that wakes you up at night.  · Sometimes, tenderness, swelling, warmth, or redness over the affected area.  · Loss of strength.  · Limited motion of the shoulder, especially reaching behind the back (to the back pocket or to unhook bra) or across your body.  · Crackling sound (crepitation) when moving the arm.  · Biceps tendon pain and inflammation (in the front of the shoulder). Worse when bending the elbow or lifting.  CAUSES   Impingement syndrome is often an overuse injury, in which chronic (repetitive) motions cause the tendons or bursa to become inflamed. A strain occurs when a force is paced on the tendon or muscle that is greater than it can withstand.  Common mechanisms of injury include:  Stress from sudden increase in duration, frequency, or intensity of training.  · Direct hit (trauma) to the shoulder.  · Aging, erosion of the tendon with normal use.  · Bony bump on shoulder (acromial spur).  RISK INCREASES WITH:  · Contact sports (football, wrestling, boxing).  · Throwing sports (baseball, tennis, volleyball).  · Weightlifting and bodybuilding.  · Heavy labor.  · Previous injury to the rotator cuff, including impingement.  · Poor shoulder strength and flexibility.  · Failure to warm up properly before activity.  · Inadequate protective equipment.  · Old age.  · Bony bump on shoulder (acromial spur).  PREVENTION   · Warm up and stretch properly before activity.  · Allow for adequate recovery between workouts.  · Maintain physical fitness:    Strength, flexibility, and endurance.    Cardiovascular fitness.  · Learn and use proper exercise technique.  PROGNOSIS   If treated properly, impingement syndrome usually goes away within 6 weeks. Sometimes surgery is required.   RELATED COMPLICATIONS   · Longer healing time if not properly treated, or if not given enough time to heal.  · Recurring symptoms, that result in a chronic condition.  · Shoulder stiffness, frozen shoulder, or loss of motion.  · Rotator cuff tendon tear.  · Recurring symptoms, especially if activity is resumed too soon, with overuse, with a direct blow, or when using poor technique.  TREATMENT   Treatment first involves the use of ice and medicine, to reduce pain and inflammation. The use   of strengthening and stretching exercises may help reduce pain with activity. These exercises may be performed at home or with a therapist. If non-surgical treatment is unsuccessful after more than 6 months, surgery may be advised. After surgery and rehabilitation, activity is usually possible in 3 months.   MEDICATION  · If pain medicine is needed, nonsteroidal anti-inflammatory medicines (aspirin and  ibuprofen), or other minor pain relievers (acetaminophen), are often advised.  · Do not take pain medicine for 7 days before surgery.  · Prescription pain relievers may be given, if your caregiver thinks they are needed. Use only as directed and only as much as you need.  · Corticosteroid injections may be given by your caregiver. These injections should be reserved for the most serious cases, because they may only be given a certain number of times.  HEAT AND COLD  · Cold treatment (icing) should be applied for 10 to 15 minutes every 2 to 3 hours for inflammation and pain, and immediately after activity that aggravates your symptoms. Use ice packs or an ice massage.  · Heat treatment may be used before performing stretching and strengthening activities prescribed by your caregiver, physical therapist, or athletic trainer. Use a heat pack or a warm water soak.  SEEK MEDICAL CARE IF:   · Symptoms get worse or do not improve in 4 to 6 weeks, despite treatment.  · New, unexplained symptoms develop. (Drugs used in treatment may produce side effects.)  EXERCISES   RANGE OF MOTION (ROM) AND STRETCHING EXERCISES - Impingement Syndrome (Rotator Cuff   Tendinitis, Bursitis)  These exercises may help you when beginning to rehabilitate your injury. Your symptoms may go away with or without further involvement from your physician, physical therapist or athletic trainer. While completing these exercises, remember:   · Restoring tissue flexibility helps normal motion to return to the joints. This allows healthier, less painful movement and activity.  · An effective stretch should be held for at least 30 seconds.  · A stretch should never be painful. You should only feel a gentle lengthening or release in the stretched tissue.  STRETCH - Flexion, Standing  · Stand with good posture. With an underhand grip on your right / left hand, and an overhand grip on the opposite hand, grasp a broomstick or cane so that your hands are a  little more than shoulder width apart.  · Keeping your right / left elbow straight and shoulder muscles relaxed, push the stick with your opposite hand, to raise your right / left arm in front of your body and then overhead. Raise your arm until you feel a stretch in your right / left shoulder, but before you have increased shoulder pain.  · Try to avoid shrugging your right / left shoulder as your arm rises, by keeping your shoulder blade tucked down and toward your mid-back spine. Hold for __________ seconds.  · Slowly return to the starting position.  Repeat __________ times. Complete this exercise __________ times per day.  STRETCH - Abduction, Supine  · Lie on your back. With an underhand grip on your right / left hand and an overhand grip on the opposite hand, grasp a broomstick or cane so that your hands are a little more than shoulder width apart.  · Keeping your right / left elbow straight and your shoulder muscles relaxed, push the stick with your opposite hand, to raise your right / left arm out to the side of your body and then overhead. Raise your   arm until you feel a stretch in your right / left shoulder, but before you have increased shoulder pain.  · Try to avoid shrugging your right / left shoulder as your arm rises, by keeping your shoulder blade tucked down and toward your mid-back spine. Hold for __________ seconds.  · Slowly return to the starting position.  Repeat __________ times. Complete this exercise __________ times per day.  ROM - Flexion, Active-Assisted  · Lie on your back. You may bend your knees for comfort.  · Grasp a broomstick or cane so your hands are about shoulder width apart. Your right / left hand should grip the end of the stick, so that your hand is positioned "thumbs-up," as if you were about to shake hands.  · Using your healthy arm to lead, raise your right / left arm overhead, until you feel a gentle stretch in your shoulder. Hold for __________ seconds.  · Use the stick  to assist in returning your right / left arm to its starting position.  Repeat __________ times. Complete this exercise __________ times per day.   ROM - Internal Rotation, Supine   · Lie on your back on a firm surface. Place your right / left elbow about 60 degrees away from your side. Elevate your elbow with a folded towel, so that the elbow and shoulder are the same height.  · Using a broomstick or cane and your strong arm, pull your right / left hand toward your body until you feel a gentle stretch, but no increase in your shoulder pain. Keep your shoulder and elbow in place throughout the exercise.  · Hold for __________ seconds. Slowly return to the starting position.  Repeat __________ times. Complete this exercise __________ times per day.  STRETCH - Internal Rotation  · Place your right / left hand behind your back, palm up.  · Throw a towel or belt over your opposite shoulder. Grasp the towel with your right / left hand.  · While keeping an upright posture, gently pull up on the towel, until you feel a stretch in the front of your right / left shoulder.  · Avoid shrugging your right / left shoulder as your arm rises, by keeping your shoulder blade tucked down and toward your mid-back spine.  · Hold for __________ seconds. Release the stretch, by lowering your healthy hand.  Repeat __________ times. Complete this exercise __________ times per day.  ROM - Internal Rotation   · Using an underhand grip, grasp a stick behind your back with both hands.  · While standing upright with good posture, slide the stick up your back until you feel a mild stretch in the front of your shoulder.  · Hold for __________ seconds. Slowly return to your starting position.  Repeat __________ times. Complete this exercise __________ times per day.   STRETCH - Posterior Shoulder Capsule   · Stand or sit with good posture. Grasp your right / left elbow and draw it across your chest, keeping it at the same height as your  shoulder.  · Pull your elbow, so your upper arm comes in closer to your chest. Pull until you feel a gentle stretch in the back of your shoulder.  · Hold for __________ seconds.  Repeat __________ times. Complete this exercise __________ times per day.  STRENGTHENING EXERCISES - Impingement Syndrome (Rotator Cuff Tendinitis, Bursitis)  These exercises may help you when beginning to rehabilitate your injury. They may resolve your symptoms with or without further   involvement from your physician, physical therapist or athletic trainer. While completing these exercises, remember:  · Muscles can gain both the endurance and the strength needed for everyday activities through controlled exercises.  · Complete these exercises as instructed by your physician, physical therapist or athletic trainer. Increase the resistance and repetitions only as guided.  · You may experience muscle soreness or fatigue, but the pain or discomfort you are trying to eliminate should never worsen during these exercises. If this pain does get worse, stop and make sure you are following the directions exactly. If the pain is still present after adjustments, discontinue the exercise until you can discuss the trouble with your clinician.  · During your recovery, avoid activity or exercises which involve actions that place your injured hand or elbow above your head or behind your back or head. These positions stress the tissues which you are trying to heal.  STRENGTH - Scapular Depression and Adduction   · With good posture, sit on a firm chair. Support your arms in front of you, with pillows, arm rests, or on a table top. Have your elbows in line with the sides of your body.  · Gently draw your shoulder blades down and toward your mid-back spine. Gradually increase the tension, without tensing the muscles along the top of your shoulders and the back of your neck.  · Hold for __________ seconds. Slowly release the tension and relax your muscles  completely before starting the next repetition.  · After you have practiced this exercise, remove the arm support and complete the exercise in standing as well as sitting position.  Repeat __________ times. Complete this exercise __________ times per day.   STRENGTH - Shoulder Abductors, Isometric  · With good posture, stand or sit about 4-6 inches from a wall, with your right / left side facing the wall.  · Bend your right / left elbow. Gently press your right / left elbow into the wall. Increase the pressure gradually, until you are pressing as hard as you can, without shrugging your shoulder or increasing any shoulder discomfort.  · Hold for __________ seconds.  · Release the tension slowly. Relax your shoulder muscles completely before you begin the next repetition.  Repeat __________ times. Complete this exercise __________ times per day.   STRENGTH - External Rotators, Isometric  · Keep your right / left elbow at your side and bend it 90 degrees.  · Step into a door frame so that the outside of your right / left wrist can press against the door frame without your upper arm leaving your side.  · Gently press your right / left wrist into the door frame, as if you were trying to swing the back of your hand away from your stomach. Gradually increase the tension, until you are pressing as hard as you can, without shrugging your shoulder or increasing any shoulder discomfort.  · Hold for __________ seconds.  · Release the tension slowly. Relax your shoulder muscles completely before you begin the next repetition.  Repeat __________ times. Complete this exercise __________ times per day.   STRENGTH - Supraspinatus   · Stand or sit with good posture. Grasp a __________ weight, or an exercise band or tubing, so that your hand is "thumbs-up," like you are shaking hands.  · Slowly lift your right / left arm in a "V" away from your thigh, diagonally into the space between your side and straight ahead. Lift your hand to  shoulder height or as   far as you can, without increasing any shoulder pain. At first, many people do not lift their hands above shoulder height.  · Avoid shrugging your right / left shoulder as your arm rises, by keeping your shoulder blade tucked down and toward your mid-back spine.  · Hold for __________ seconds. Control the descent of your hand, as you slowly return to your starting position.  Repeat __________ times. Complete this exercise __________ times per day.   STRENGTH - External Rotators  · Secure a rubber exercise band or tubing to a fixed object (table, pole) so that it is at the same height as your right / left elbow when you are standing or sitting on a firm surface.  · Stand or sit so that the secured exercise band is at your uninjured side.  · Bend your right / left elbow 90 degrees. Place a folded towel or small pillow under your right / left arm, so that your elbow is a few inches away from your side.  · Keeping the tension on the exercise band, pull it away from your body, as if pivoting on your elbow. Be sure to keep your body steady, so that the movement is coming only from your rotating shoulder.  · Hold for __________ seconds. Release the tension in a controlled manner, as you return to the starting position.  Repeat __________ times. Complete this exercise __________ times per day.   STRENGTH - Internal Rotators   · Secure a rubber exercise band or tubing to a fixed object (table, pole) so that it is at the same height as your right / left elbow when you are standing or sitting on a firm surface.  · Stand or sit so that the secured exercise band is at your right / left side.  · Bend your elbow 90 degrees. Place a folded towel or small pillow under your right / left arm so that your elbow is a few inches away from your side.  · Keeping the tension on the exercise band, pull it across your body, toward your stomach. Be sure to keep your body steady, so that the movement is coming only from  your rotating shoulder.  · Hold for __________ seconds. Release the tension in a controlled manner, as you return to the starting position.  Repeat __________ times. Complete this exercise __________ times per day.   STRENGTH - Scapular Protractors, Standing   · Stand arms length away from a wall. Place your hands on the wall, keeping your elbows straight.  · Begin by dropping your shoulder blades down and toward your mid-back spine.  · To strengthen your protractors, keep your shoulder blades down, but slide them forward on your rib cage. It will feel as if you are lifting the back of your rib cage away from the wall. This is a subtle motion and can be challenging to complete. Ask your caregiver for further instruction, if you are not sure you are doing the exercise correctly.  · Hold for __________ seconds. Slowly return to the starting position, resting the muscles completely before starting the next repetition.  Repeat __________ times. Complete this exercise __________ times per day.  STRENGTH - Scapular Protractors, Supine  · Lie on your back on a firm surface. Extend your right / left arm straight into the air while holding a __________ weight in your hand.  · Keeping your head and back in place, lift your shoulder off the floor.  · Hold for __________ seconds. Slowly   return to the starting position, and allow your muscles to relax completely before starting the next repetition.  Repeat __________ times. Complete this exercise __________ times per day.  STRENGTH - Scapular Protractors, Quadruped  · Get onto your hands and knees, with your shoulders directly over your hands (or as close as you can be, comfortably).  · Keeping your elbows locked, lift the back of your rib cage up into your shoulder blades, so your mid-back rounds out. Keep your neck muscles relaxed.  · Hold this position for __________ seconds. Slowly return to the starting position and allow your muscles to relax completely before starting the  next repetition.  Repeat __________ times. Complete this exercise __________ times per day.   STRENGTH - Scapular Retractors  · Secure a rubber exercise band or tubing to a fixed object (table, pole), so that it is at the height of your shoulders when you are either standing, or sitting on a firm armless chair.  · With a palm down grip, grasp an end of the band in each hand. Straighten your elbows and lift your hands straight in front of you, at shoulder height. Step back, away from the secured end of the band, until it becomes tense.  · Squeezing your shoulder blades together, draw your elbows back toward your sides, as you bend them. Keep your upper arms lifted away from your body throughout the exercise.  · Hold for __________ seconds. Slowly ease the tension on the band, as you reverse the directions and return to the starting position.  Repeat __________ times. Complete this exercise __________ times per day.  STRENGTH - Shoulder Extensors   · Secure a rubber exercise band or tubing to a fixed object (table, pole) so that it is at the height of your shoulders when you are either standing, or sitting on a firm armless chair.  · With a thumbs-up grip, grasp an end of the band in each hand. Straighten your elbows and lift your hands straight in front of you, at shoulder height. Step back, away from the secured end of the band, until it becomes tense.  · Squeezing your shoulder blades together, pull your hands down to the sides of your thighs. Do not allow your hands to go behind you.  · Hold for __________ seconds. Slowly ease the tension on the band, as you reverse the directions and return to the starting position.  Repeat __________ times. Complete this exercise __________ times per day.   STRENGTH - Scapular Retractors and External Rotators   · Secure a rubber exercise band or tubing to a fixed object (table, pole) so that it is at the height as your shoulders, when you are either standing, or sitting on a  firm armless chair.  · With a palm down grip, grasp an end of the band in each hand. Bend your elbows 90 degrees and lift your elbows to shoulder height, at your sides. Step back, away from the secured end of the band, until it becomes tense.  · Squeezing your shoulder blades together, rotate your shoulders so that your upper arms and elbows remain stationary, but your fists travel upward to head height.  · Hold for __________ seconds. Slowly ease the tension on the band, as you reverse the directions and return to the starting position.  Repeat __________ times. Complete this exercise __________ times per day.   STRENGTH - Scapular Retractors and External Rotators, Rowing   · Secure a rubber exercise band or tubing to   a fixed object (table, pole) so that it is at the height of your shoulders, when you are either standing, or sitting on a firm armless chair.  · With a palm down grip, grasp an end of the band in each hand. Straighten your elbows and lift your hands straight in front of you, at shoulder height. Step back, away from the secured end of the band, until it becomes tense.  · Step 1: Squeeze your shoulder blades together. Bending your elbows, draw your hands to your chest, as if you are rowing a boat. At the end of this motion, your hands and elbow should be at shoulder height and your elbows should be out to your sides.  · Step 2: Rotate your shoulders, to raise your hands above your head. Your forearms should be vertical and your upper arms should be horizontal.  · Hold for __________ seconds. Slowly ease the tension on the band, as you reverse the directions and return to the starting position.  Repeat __________ times. Complete this exercise __________ times per day.   STRENGTH - Scapular Depressors  · Find a sturdy chair without wheels, such as a dining room chair.  · Keeping your feet on the floor, and your hands on the chair arms, lift your bottom up from the seat, and lock your elbows.  · Keeping  your elbows straight, allow gravity to pull your body weight down. Your shoulders will rise toward your ears.  · Raise your body against gravity by drawing your shoulder blades down your back, shortening the distance between your shoulders and ears. Although your feet should always maintain contact with the floor, your feet should progressively support less body weight, as you get stronger.  · Hold for __________ seconds. In a controlled and slow manner, lower your body weight to begin the next repetition.  Repeat __________ times. Complete this exercise __________ times per day.      This information is not intended to replace advice given to you by your health care provider. Make sure you discuss any questions you have with your health care provider.     Document Released: 08/03/2005 Document Revised: 08/24/2014 Document Reviewed: 11/15/2008  [employer] Patient Education ©2016 [employer]

## 2016-05-19 NOTE — Progress Notes (Signed)
Subjective:    Patient ID: Katherine [name], female    DOB: September 08, 1955, 60 y.o.   MRN: [id number]  HPI   Katherine [name] is here in follow up of her chronic pain. She is having bilateral arm pain, feels that they're "sprained."  She went to the ED with severe chest pain and cough. Work up was negative for any cardiac events.   I started gabapentin which was not tolerated and so we switched her to tegretol. Tegretol helped a lot initially. She thinks that the change in weather has impacted her pain.   She has a treadmill which she likes to use at home. She has to be careful not to flare up her bursitis.    Pain Inventory Average Pain 8 Pain Right Now 8 My pain is sharp and stabbing  In the last 24 hours, has pain interfered with the following? General activity 9 Relation with others 10 Enjoyment of life 10 What TIME of day is your pain at its worst? daytime Sleep (in general) Poor  Pain is worse with: some activites Pain improves with: rest and nothing  Relief from Meds: 0  Mobility walk without assistance  Function disabled: date disabled 1989  Neuro/Psych weakness numbness tingling trouble walking depression anxiety  Prior Studies Any changes since last visit?  no  Physicians involved in your care Any changes since last visit?  no Primary care Katherine [name]   Family History  Problem Relation Age of Onset  . Cancer Mother     liver  . Heart disease Father   . Cancer Father     colon  . Arrhythmia Brother     Atrial Fibrillation  . Arrhythmia Paternal Aunt     Atrial Fibrillation   Social History   Social History  . Marital status: Married    Spouse name: N/A  . Number of children: N/A  . Years of education: N/A   Social History Main Topics  . Smoking status: Former Smoker    Packs/day: 1.00    Years: 30.00    Types: Cigarettes    Quit date: 08/17/2002  . Smokeless tobacco: Never Used  . Alcohol use No  . Drug use: No  . Sexual activity:  Not Asked   Other Topics Concern  . None   Social History Narrative   She is currently married, and the caregiver of her husband who is recovering from surgery for tongue cancer now diagnosed with lung cancer. Prior to his diagnosis of her husband, she actually had adopted a 46-year-old child who she knows caring for as well. With all the surgeries, they have been quite financially troubled. Thanks the help of her community and church, they have been able to stay "alfoat."     She is a former smoker who quit in 2004 after a 30-pack-year history.   She is active chasing a 52-year-old child, does not do routine exercise. She's been quite depressed with the condition of her husband, and admits to eating comfort herself.   She does not drink alcohol.   Past Surgical History:  Procedure Laterality Date  . CARDIAC CATHETERIZATION  5/'01, 3/'02, 8/'03, 10/'04; 1/'15   08/22/2013: LAD & RCA 100%; LIMA-LAD & SVG-rPDA patent; Cx-- OM1 60%, OM2 ostial ~50%; SVG-D1 - 80% mid, 50% distal ISR --PCI  . CARDIAC CATHETERIZATION  5/'01, 3/'02, 8/'03, 10/'04; 1/'15   08/22/2013: LAD & RCA 100%; LIMA-LAD & SVG-rPDA patent; Cx-- OM1 60%, OM2 ostial ~50%; SVG-D1 - 80% mid, 50%  distal ISR --PCI  . CATHETER REMOVAL    . CHOLECYSTECTOMY N/A 10/29/2014   Procedure: LAPAROSCOPIC CHOLECYSTECTOMY WITH INTRAOPERATIVE CHOLANGIOGRAM;  Surgeon: Excell Seltzer, MD;  Location: [facility];  Service: General;  Laterality: N/A;  . CORONARY ANGIOPLASTY  1994   x5  . CORONARY ARTERY BYPASS GRAFT  1995   LIMA-LAD, SVG-RPDA, SVG-D1  . INCISE AND DRAIN ABCESS    . KIDNEY TRANSPLANT  1991  . LAPAROSCOPIC GASTRIC BANDING  04/2004; 10/'09, 2/'10   Port Replacement x 2  . LEFT HEART CATHETERIZATION WITH CORONARY/GRAFT ANGIOGRAM N/A 08/23/2013   Procedure: LEFT HEART CATHETERIZATION WITH Beatrix Fetters;  Surgeon: Wellington Hampshire, MD;  Location: [place] CATH LAB;  Service: Cardiovascular;  Laterality: N/A;  . Lower Extremity Arterial  Dopplers  08/2013   ABI: R 0.96, L 1.04  . MULTIPLE TOOTH EXTRACTIONS  age 5  . PERCUTANEOUS CORONARY STENT INTERVENTION (PCI-S)  5/'01, 3/'02, 8/'03, 10/'04;   '01 - S660 BMS 2.5 x 9 - dSVG-D1 into D1; '02- post-stent stenosis - 2.5 x 8 Pixel BMS; '8\03: ISR/Thrombosis into native D1 - AngioJet, 2.5 x 13 Pixel; '04 - ISR 95% - covered stented area with Taxus DES 2.5 mm x 20 (2.88)  . PERCUTANEOUS CORONARY STENT INTERVENTION (PCI-S)  08/22/2013   mid SVG-D1 80%; distal stent ~50% ISR; Promus Prermier DES 2.75 mm xc 20 mm (2.8 mm)  . PERCUTANEOUS CORONARY STENT INTERVENTION (PCI-S)  08/23/2013   Procedure: PERCUTANEOUS CORONARY STENT INTERVENTION (PCI-S);  Surgeon: Wellington Hampshire, MD;  Location: [facility] C[facility];  Service: Cardiovascular;;  . PORT-A-CATH REMOVAL     kidney  . TRANSTHORACIC ECHOCARDIOGRAM  02/2013   NL LV Size, EF 55-60%; MAC with Mild MR.  . TUBAL LIGATION    . wrist fistula repair Left    dialysis for one year   Past Medical History:  Diagnosis Date  . Anxiety   . Bilateral carotid artery stenosis    Carotid duplex 04/8263: 1-58% LICA, 30-94% RICA, >07% RECA, f/u 1 yr suggested  . CAD (coronary artery disease) of bypass graft 5/01; 3/'02, 8/'03, 10/'04; 1/15   PCI x 5 to SVG-D1   . CAD in native artery 07/1993    3 Vessel Disease (LAD-D1 & RCA) -- CABG  . CAD S/P percutaneous coronary angioplasty    PCI to SVG-D1 insertion/native D1 x 4 = '01 -(S660 BMS 2.5 x 9 - insertion into D1; '02 - distal overlap ACS Pixel 2.5 x 8  BMS; '03 distal/native ISR/Thrombosis - Pixel 2.5 x 13; '04 - ISR-  Taxus 2.5 x 20 (covered all);; 1/15 - mid SVG-D1 (50% distal ISR) - Promus P 2.75 x 20 -- 2.8 mm  . COPD mixed type ([place])    Followed by Dr. Lamonte Sakai "pulmonologist said no COPD"  . Depression   . Depression with anxiety   . Diabetes mellitus type 2 in obese ([place])   . Dyslipidemia, goal LDL below 70    08/2012: TC 137, TG 200, HDL 32!, LDL 45; on statin (followed by Dr.Deterding)  . Enlarged  heart    "told one time"  . ESRD (end stage renal disease) ([place]) 1991   s/p Cadaveric Renal Transplant [facility] - Dr. Jimmy Footman)   . Fibromyalgia   . GERD (gastroesophageal reflux disease)   . Glomerulonephritis, chronic, rapidly progressive 18  . 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 07/1993   Rescue PTCA of RCA -- referred for CABG.  . H/O: GI bleed   .  Hypertension associated with diabetes ([place])   . MRSA (methicillin resistant staph aureus) culture positive   . Obesity    s/p Lap Band 04/2004- Port Replacement 10/09 & 2/10 for infection; Dr. Excell Seltzer.  . OSA on CPAP    oxygen @night  @ 2L  . PAD (peripheral artery disease) ([place]) 08/2013   LEA Dopplers to be read by Dr. Fletcher Anon  . PAF (paroxysmal atrial fibrillation) ([place])    Noted on CardioNet Monitor 11/5-18 - rate ~112  . Recurrent boils    Bilateral Groin  . Rheumatoid arthritis ([place])    Per Patient Report; associated with OA  . 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 Servando Snare: LIMA-LAD, SVG-bifurcatingD1, SVG-rPDA  . S/p cadaver renal transplant [place]  . Stroke [facility]) 2012   "right eye stroke- half blind now"  . Unstable angina ([place]) 5/01; 3/'02, 8/'03, 10/'04; 1/15   x 5 occurences since Inf-Post STEMI in 1994   BP 137/80   Pulse 78   SpO2 98%   Opioid Risk Score:   Fall Risk Score:  `1  Depression screen PHQ 2/9  Depression screen [facility] 2/9 05/19/2016 03/30/2016  Decreased Interest 3 3  Down, Depressed, Hopeless 3 3  PHQ - 2 Score 6 6  Altered sleeping - 3  Tired, decreased energy - 3  Change in appetite - 2  Feeling bad or failure about yourself  - 3  Trouble concentrating - 2  Moving slowly or fidgety/restless - 0  Suicidal thoughts - 0  PHQ-9 Score - 19  Some recent data might be hidden   Review of Systems  All other systems reviewed and are negative.      Objective:   Physical Exam  General: Alert and oriented x 3, No apparent distress. She is overweight HEENT: Head is  normocephalic, atraumatic, PERRLA, EOMI, sclera anicteric, oral mucosa pink and moist, dentition intact, ext ear canals clear,  Neck: Supple without JVD or lymphadenopathy Heart: Reg rate and rhythm. No murmurs rubs or gallops Chest: CTA bilaterally without wheezes, rales, or rhonchi; no distress Abdomen: Soft, non-tender, non-distended, bowel sounds positive. Extremities: No clubbing, cyanosis, or edema. Pulses are 2+ Skin: Clean and intact without signs of breakdown. Has dry skin in places. Numerous skin tags Neuro: Pt is cognitively appropriate with normal insight, memory, and awareness. Cranial nerves 2-12 are intact. Sensory exam is diminished to light touch below the knees bilaterally. Reflexes are 1-2+ in all 4's. Fine motor coordination is intact. No tremors. Motor function is grossly 5/5.  Musculoskeletal: she is tender in the neck with simple flexion and extension. I ranged her low back and she was able to bend beyond 90 degrees, extension beyond 15 degrees led to pain as did lateral bending to the left more than right. Rotation caused lumbar discomfort as well. She has no gross rheumatoid deformities in the hands or feet. She has more pain in the right shoulder with IR/ER and rotator cuff impingement maneuvers.  Both knees exhibited mild effusion but minimal crepitus. Joint line pain was provoked at either knee medially and latetrally.  Psych: Pt's affect is appropriate. Pt is cooperative         Assessment & Plan:  1. Fibromyalgia 2. Rheumatoid Arthritis by history 3. Osteoarthritis--polyarticular 4. ESRD with hx of renal transplant on chronic steroids 5. Right rotator cuff tendonitis    Plan: 1. Aquatic exercises would be ideal for fibromyalgia 2. Begin a trial of oxycodone 5mg  q8 prn, #60 3. Continue cymbalta  to 60mg  daily, decrease zoloft to 100mg . Would like to wean zoloft to off moving forward given high risk of serotonin syndrome.   4. Titrate tegretol to 200mg  TID   5. ?Rheum follow up 6. Consider formal PT vs aquatic based/low impact HEP    30 minutes of face to face patient care time were spent during this visit. All questions were encouraged and answered. Return visit in 6 weeks.

## 2016-06-16 ENCOUNTER — Encounter: Payer: Self-pay | Admitting: Emergency Medicine

## 2016-06-16 ENCOUNTER — Ambulatory Visit (INDEPENDENT_AMBULATORY_CARE_PROVIDER_SITE_OTHER): Payer: Medicare Other | Admitting: Emergency Medicine

## 2016-06-16 VITALS — BP 138/82 | HR 54 | Ht 61.0 in | Wt 203.0 lb

## 2016-06-16 DIAGNOSIS — K219 Gastro-esophageal reflux disease without esophagitis: Secondary | ICD-10-CM

## 2016-06-16 DIAGNOSIS — J301 Allergic rhinitis due to pollen: Secondary | ICD-10-CM | POA: Diagnosis not present

## 2016-06-16 DIAGNOSIS — G4730 Sleep apnea, unspecified: Secondary | ICD-10-CM

## 2016-06-16 DIAGNOSIS — R0602 Shortness of breath: Secondary | ICD-10-CM

## 2016-06-16 DIAGNOSIS — I6521 Occlusion and stenosis of right carotid artery: Secondary | ICD-10-CM | POA: Diagnosis not present

## 2016-06-16 MED ORDER — BUDESONIDE-FORMOTEROL FUMARATE 160-4.5 MCG/ACT IN AERO
2.0000 | INHALATION_SPRAY | Freq: Two times a day (BID) | RESPIRATORY_TRACT | 0 refills | Status: DC
Start: 2016-06-16 — End: 2017-03-29

## 2016-06-16 MED ORDER — BUDESONIDE-FORMOTEROL FUMARATE 160-4.5 MCG/ACT IN AERO: 2.0000 | INHALATION_SPRAY | Freq: Two times a day (BID) | RESPIRATORY_TRACT | 5 refills | Status: DC

## 2016-06-16 NOTE — Assessment & Plan Note (Signed)
Continue Flonase, continue Singulair. Consider adding nasal saline in the future.

## 2016-06-16 NOTE — Progress Notes (Signed)
60 year old female patient has a known history of obesity, status post lap band surgery with significant weight loss. Patient also has a history of renal transplant on chronic immunosuppression with Imuran and prednisone. The patient has a history of sleep apnea now much improved after significant weight loss.  Still needs repeat PSG to prove that OSA has resolved. She has stopped using CPAP.   May 15, 2009 --Present for an acute office visit. Complains of coughing and chest tightness/congestion x 4wks. The cough has started a month ago and had one episode of bloody-tinged phlegm. Didn't take any OTC to help the cough. Has had some episode of vomitted after eating , this is chronic since lap band. Denies chest pain, abd. pain, melena. orthopnea, hemoptysis, fever, n/v/d, edema, headache   ROV 12/18/09 -- ROV to follow OSA/OHS, hx lap band sgy and GERD/reflux. Tells me that she was exposed to cleaning solution, bleach in the last week. Having some chest tightness, dry cough. Doing sinus rinses + nasonex. Taking omeprazole.   ROV 01/23/10 -- Acute visit. Has had fever, aches, head congestion, tight chest. Has had some dry cough, some nausea. Having trouble getting a deep breath.   July 30, 2010 --Presents for an acute office viist. Complains of sinus pressure/congestion, PND and sore throat, right ear discomfort, prod cough with yellow mucus, increased SOB, wheezing x 2 weeks . Has been doing well until last 2 weeks. Does not want to be sick for the holidays, family is coming in to visit.Denies chest pain, orthopnea, hemoptysis, fever, n/v/d, edema. Has alot of sinus congestion and pressure.   ROV 04/29/11 -- OSA/OHS, hx lap band sgy and GERD/reflux, often assoc w cough. She was hospitalized in Jan - Feb '12 for aspiration of gastric contents. She had to have the lap-band loosened because it was contributing to the aspiration. She gained 40 lbs and hasn't lost it. She presents today c/o sinus  congestion, HA beginning 1 month ago, some drainage to back of throat. Has been on her flonase and singulair. Doesn't use pulmicort anymore. No purulent mucous, no fever.  >>saline nasal rinses   05/06/11  Acute OV  Patient is no better since last visit with Dr. Lamonte Sakai. Patient c/o right ear pain, sore throat is worse since last visit.  Also feels fatigued. Throat is very sore . Singulair , flonase and saline rinses are not helping.  OTC is not helping.  >>Omnicef rx   Acute OV 12/23/11 --  Patient c/o ear pain in both ears, sore throat, chest tightness, chest pain mostly in her back, cough with thick stringy light yellow mucus, and wheezing that is mostly at night x 2 weeks. Just finished Doxy for boil.  CXR today shows  No acute process.  No fever or hemoptysis   ROV 02/05/12 -- OSA/OHS, hx lap band sgy and GERD/reflux, often assoc w cough. Treated for persistent cough and drainage as above. She is still experiencing some drainage, tolerating sinus washes and flonase. Still has frequent reflux/emesis - almost certainly causing UA irritation. [place] follows her lap-band.  Took diflucan for thrush 2 weeks ago.  She is having pain on inspiration, especially when she lays down supine.   ROV 04/02/15 -- Follow-up visit for chronic cough in the setting of allergic rhinitis and GERD. She also has obstructive sleep apnea and a history of significant weight loss after lap band surgery, hx of a renal transplant.  She has been maintained on singular and flonase, but ran out of these  about 1-2 months ago. Since she ran out she is having more nasal congestion, chest congestion.   She has gained about 35 lbs since our last visit in 2013. Her lap band was loosened due to reflux and aspiration over the last several years. Remains prednisone and imuran.  She does not have daytime sleepiness. No naps. She is unsure about whether she snores.   ROV 06/16/16 -- patient has a history of obstructive sleep apnea that  improved after she had lab band surgery, also history of renal transplant, allergic rhinitis, GERD and an upper airway irritation syndrome with cough. At her last visit we considered reordering a polysomnogram because she gained weight. We deferred this. Also we had planned to do poorly function testing to try and quantify and prove asthma. She reports that she has been having a lot of wheeze, cough, dyspnea and wheeze. Has been especially bad during the allergy season. She hears wheeze at night, also in the am when she wakes up. She does have some breakthru reflux sx on omeprazole. She is on flonase, singulair. She is taking ventolin, was started on ICS last time here, has had some thrush.    PE :  Vitals:   06/16/16 1447  BP: 138/82  Pulse: (!) 54  SpO2: 97%  Weight: 203 lb (92.1 kg)  Height: 5\' 1"  (1.549 m)   Gen: Pleasant, obese, has gained wt, in no distress,  normal affect  ENT post pharynx erythema, no purulence  Neck: No JVD, no TMG, no carotid bruits  Lungs: No use of accessory muscles, no dullness to percussion, clear without rales or rhonchi  Cardiovascular: RRR, heart sounds normal, no murmur or gallops, no peripheral edema  Musculoskeletal: No deformities, no cyanosis or clubbing  Neuro: alert, non focal  Skin: Warm, no lesions or rashes  Sleep apnea Need to discuss and decide as we go Forward whether to repeat her polysomnogram  Chronic allergic rhinitis Continue Flonase, continue Singulair. Consider adding nasal saline in the future.  GERD Severe reflux that continues to have breakthrough symptoms, related to her lap band. She is on omeprazole 40 mg daily. I would like to increase the twice a day for the next month to see if this helps her.  Extrinsic asthma This is a supposing diagnosis without any spirometry. She does seem to respond to a bronchodilator and has benefited in the past from an inhaled steroid. For now I will change her Pulmicort to Symbicort to see  if she benefits. She needs pulm function testing we will do this at her next appointment.   Baltazar Apo, MD, PhD 06/16/2016, 3:19 PM [facility] [phone] or if no answer [phone]

## 2016-06-16 NOTE — Patient Instructions (Addendum)
Please stop pulmicort inhaler.  Please start Symbicort 2 puffs twice a day Take albuterol (ventolin) 2 puffs up to every 4 hours if needed for shortness of breath.  Please increase your omeprazole to 40mg  twice a day for the next month, then go back to daily Please continue your singulair, flonase Continue prednisone and imuran We will perform pulmonary function testing at your next visit.  Follow with Dr Lamonte Sakai in 1 month with full PFt on the same day.

## 2016-06-16 NOTE — Assessment & Plan Note (Signed)
Severe reflux that continues to have breakthrough symptoms, related to her lap band. She is on omeprazole 40 mg daily. I would like to increase the twice a day for the next month to see if this helps her.

## 2016-06-16 NOTE — Assessment & Plan Note (Signed)
This is a supposing diagnosis without any spirometry. She does seem to respond to a bronchodilator and has benefited in the past from an inhaled steroid. For now I will change her Pulmicort to Symbicort to see if she benefits. She needs pulm function testing we will do this at her next appointment.

## 2016-06-16 NOTE — Assessment & Plan Note (Signed)
Need to discuss and decide as we go Forward whether to repeat her polysomnogram

## 2016-06-29 ENCOUNTER — Other Ambulatory Visit: Payer: Self-pay | Admitting: Cardiology

## 2016-06-30 ENCOUNTER — Encounter: Payer: Medicare Other | Attending: Physical Medicine & Rehabilitation | Admitting: Physical Medicine & Rehabilitation

## 2016-06-30 ENCOUNTER — Encounter: Payer: Self-pay | Admitting: Physical Medicine & Rehabilitation

## 2016-06-30 VITALS — BP 133/90 | HR 101 | Resp 14

## 2016-06-30 DIAGNOSIS — M7581 Other shoulder lesions, right shoulder: Secondary | ICD-10-CM

## 2016-06-30 DIAGNOSIS — I120 Hypertensive chronic kidney disease with stage 5 chronic kidney disease or end stage renal disease: Secondary | ICD-10-CM | POA: Insufficient documentation

## 2016-06-30 DIAGNOSIS — M47816 Spondylosis without myelopathy or radiculopathy, lumbar region: Secondary | ICD-10-CM

## 2016-06-30 DIAGNOSIS — Z7952 Long term (current) use of systemic steroids: Secondary | ICD-10-CM | POA: Insufficient documentation

## 2016-06-30 DIAGNOSIS — N186 End stage renal disease: Secondary | ICD-10-CM | POA: Insufficient documentation

## 2016-06-30 DIAGNOSIS — Z951 Presence of aortocoronary bypass graft: Secondary | ICD-10-CM | POA: Insufficient documentation

## 2016-06-30 DIAGNOSIS — E1122 Type 2 diabetes mellitus with diabetic chronic kidney disease: Secondary | ICD-10-CM | POA: Insufficient documentation

## 2016-06-30 DIAGNOSIS — I2510 Atherosclerotic heart disease of native coronary artery without angina pectoris: Secondary | ICD-10-CM | POA: Diagnosis not present

## 2016-06-30 DIAGNOSIS — Z5181 Encounter for therapeutic drug level monitoring: Secondary | ICD-10-CM

## 2016-06-30 DIAGNOSIS — Z940 Kidney transplant status: Secondary | ICD-10-CM | POA: Insufficient documentation

## 2016-06-30 DIAGNOSIS — F419 Anxiety disorder, unspecified: Secondary | ICD-10-CM | POA: Diagnosis not present

## 2016-06-30 DIAGNOSIS — M159 Polyosteoarthritis, unspecified: Secondary | ICD-10-CM | POA: Insufficient documentation

## 2016-06-30 DIAGNOSIS — I6521 Occlusion and stenosis of right carotid artery: Secondary | ICD-10-CM

## 2016-06-30 DIAGNOSIS — M797 Fibromyalgia: Secondary | ICD-10-CM | POA: Diagnosis not present

## 2016-06-30 DIAGNOSIS — E1142 Type 2 diabetes mellitus with diabetic polyneuropathy: Secondary | ICD-10-CM

## 2016-06-30 NOTE — Patient Instructions (Addendum)
PLEASE CALL ME WITH ANY PROBLEMS OR QUESTIONS ([phone])    Shoulder Impingement Syndrome Rehab Ask your health care provider which exercises are safe for you. Do exercises exactly as told by your health care provider and adjust them as directed. It is normal to feel mild stretching, pulling, tightness, or discomfort as you do these exercises, but you should stop right away if you feel sudden pain or your pain gets worse.Do not begin these exercises until told by your health care provider. Stretching and range of motion exercise This exercise warms up your muscles and joints and improves the movement and flexibility of your shoulder. This exercise also helps to relieve pain and stiffness. Exercise A: Passive horizontal adduction 1. Sit or stand and pull your left / right elbow across your chest, toward your other shoulder. Stop when you feel a gentle stretch in the back of your shoulder and upper arm.  Keep your arm at shoulder height.  Keep your arm as close to your body as you comfortably can. 2. Hold for __________ seconds. 3. Slowly return to the starting position. Repeat __________ times. Complete this exercise __________ times a day. Strengthening exercises These exercises build strength and endurance in your shoulder. Endurance is the ability to use your muscles for a long time, even after they get tired. Exercise B: External rotation, isometric 1. Stand or sit in a doorway, facing the door frame. 2. Bend your left / right elbow and place the back of your wrist against the door frame. Only your wrist should be touching the frame. Keep your upper arm at your side. 3. Gently press your wrist against the door frame, as if you are trying to push your arm away from your abdomen.  Avoid shrugging your shoulder while you press your hand against the door frame. Keep your shoulder blade tucked down toward the middle of your back. 4. Hold for __________ seconds. 5. Slowly release the  tension, and relax your muscles completely before you do the exercise again. Repeat __________ times. Complete this exercise __________ times a day. Exercise C: Internal rotation, isometric 1. Stand or sit in a doorway, facing the door frame. 2. Bend your left / right elbow and place the inside of your wrist against the door frame. Only your wrist should be touching the frame. Keep your upper arm at your side. 3. Gently press your wrist against the door frame, as if you are trying to push your arm toward your abdomen.  Avoid shrugging your shoulder while you press your hand against the door frame. Keep your shoulder blade tucked down toward the middle of your back. 4. Hold for __________ seconds. 5. Slowly release the tension, and relax your muscles completely before you do the exercise again. Repeat __________ times. Complete this exercise __________ times a day. Exercise D: Scapular protraction, supine 1. Lie on your back on a firm surface. Hold a __________ weight in your left / right hand. 2. Raise your left / right arm straight into the air so your hand is directly above your shoulder joint. 3. Push the weight into the air so your shoulder lifts off of the surface that you are lying on. Do not move your head, neck, or back. 4. Hold for __________ seconds. 5. Slowly return to the starting position. Let your muscles relax completely before you repeat this exercise. Repeat __________ times. Complete this exercise __________ times a day. Exercise E: Scapular retraction 1. Sit in a stable chair without armrests, or stand.  2. Secure an exercise band to a stable object in front of you so the band is at shoulder height. 3. Hold one end of the exercise band in each hand. Your palms should face down. 4. Squeeze your shoulder blades together and move your elbows slightly behind you. Do not shrug your shoulders while you do this. 5. Hold for __________ seconds. 6. Slowly return to the starting  position. Repeat __________ times. Complete this exercise __________ times a day. Exercise F: Shoulder extension 1. Sit in a stable chair without armrests, or stand. 2. Secure an exercise band to a stable object in front of you where the band is above shoulder height. 3. Hold one end of the exercise band in each hand. 4. Straighten your elbows and lift your hands up to shoulder height. 5. Squeeze your shoulder blades together and pull your hands down to the sides of your thighs. Stop when your hands are straight down by your sides. Do not let your hands go behind your body. 6. Hold for __________ seconds. 7. Slowly return to the starting position. Repeat __________ times. Complete this exercise __________ times a day. This information is not intended to replace advice given to you by your health care provider. Make sure you discuss any questions you have with your health care provider. Document Released: 08/03/2005 Document Revised: 04/09/2016 Document Reviewed: 07/06/2015 Elsevier Interactive Patient Education  2017 [employer].

## 2016-06-30 NOTE — Addendum Note (Signed)
Addended by: Elinor Parkinson I on: 06/30/2016 03:29 PM   Modules accepted: Orders

## 2016-06-30 NOTE — Progress Notes (Signed)
Subjective:    Patient ID: Katherine [name], female    DOB: 1955/10/24, 60 y.o.   MRN: [id number]  HPI   Katherine [name] is here in follow up of her chronic pain syndrome. She states that right shoulder has been really acting up for the last two weeks. She doesn't recall any triggering event. Both shoulders have been bothering her before, but never like this.   She fell in the kitchen last when she slipped on a wet floor. She fortunately only suffered some bumps and bruises. She doesn't feel that it irritated her shoulder. She describes falling as a common issue (her husband too). Katherine [name] states that her legs will give out at times when she is out or up for a longer period.   Katherine [name] is using oxycodone for more severe pain and is finding it helpful. She is trying not to use too often. Her last oxycodone was 3 days ago.      Pain Inventory Average Pain 9 Pain Right Now 7 My pain is sharp, burning and aching  In the last 24 hours, has pain interfered with the following? General activity 10 Relation with others 7 Enjoyment of life 8 What TIME of day is your pain at its worst? morning, night Sleep (in general) Poor  Pain is worse with: bending and some activites Pain improves with: rest, heat/ice and medication Relief from Meds: 5  Mobility how many minutes can you walk? 30 ability to climb steps?  yes do you drive?  yes Do you have any goals in this area?  yes  Function disabled: date disabled 80 retired I need assistance with the following:  household duties and shopping  Neuro/Psych weakness numbness trouble walking spasms depression anxiety  Prior Studies Any changes since last visit?  no  Physicians involved in your care Any changes since last visit?  no   Family History  Problem Relation Age of Onset  . Cancer Mother     liver  . Heart disease Father   . Cancer Father     colon  . Arrhythmia Brother     Atrial Fibrillation  . Arrhythmia Paternal Aunt     Atrial Fibrillation   Social History   Social History  . Marital status: Married    Spouse name: N/A  . Number of children: N/A  . Years of education: N/A   Social History Main Topics  . Smoking status: Former Smoker    Packs/day: 1.00    Years: 30.00    Types: Cigarettes    Quit date: 08/17/2002  . Smokeless tobacco: Never Used  . Alcohol use No  . Drug use: No  . Sexual activity: Not Asked   Other Topics Concern  . None   Social History Narrative   She is currently married, and the caregiver of her husband who is recovering from surgery for tongue cancer now diagnosed with lung cancer. Prior to his diagnosis of her husband, she actually had adopted a 14-year-old child who she knows caring for as well. With all the surgeries, they have been quite financially troubled. Thanks the help of her community and church, they have been able to stay "alfoat."     She is a former smoker who quit in 2004 after a 30-pack-year history.   She is active chasing a 81-year-old child, does not do routine exercise. She's been quite depressed with the condition of her husband, and admits to eating comfort herself.   She does not drink alcohol.  Past Surgical History:  Procedure Laterality Date  . CARDIAC CATHETERIZATION  5/'01, 3/'02, 8/'03, 10/'04; 1/'15   08/22/2013: LAD & RCA 100%; LIMA-LAD & SVG-rPDA patent; Cx-- OM1 60%, OM2 ostial ~50%; SVG-D1 - 80% mid, 50% distal ISR --PCI  . CARDIAC CATHETERIZATION  5/'01, 3/'02, 8/'03, 10/'04; 1/'15   08/22/2013: LAD & RCA 100%; LIMA-LAD & SVG-rPDA patent; Cx-- OM1 60%, OM2 ostial ~50%; SVG-D1 - 80% mid, 50% distal ISR --PCI  . CATHETER REMOVAL    . CHOLECYSTECTOMY N/A 10/29/2014   Procedure: LAPAROSCOPIC CHOLECYSTECTOMY WITH INTRAOPERATIVE CHOLANGIOGRAM;  Surgeon: Excell Seltzer, MD;  Location: [facility];  Service: General;  Laterality: N/A;  . CORONARY ANGIOPLASTY  1994   x5  . CORONARY ARTERY BYPASS GRAFT  1995   LIMA-LAD, SVG-RPDA, SVG-D1  . INCISE AND  DRAIN ABCESS    . KIDNEY TRANSPLANT  1991  . LAPAROSCOPIC GASTRIC BANDING  04/2004; 10/'09, 2/'10   Port Replacement x 2  . LEFT HEART CATHETERIZATION WITH CORONARY/GRAFT ANGIOGRAM N/A 08/23/2013   Procedure: LEFT HEART CATHETERIZATION WITH Beatrix Fetters;  Surgeon: Wellington Hampshire, MD;  Location: [name]ATH [facility];  Service: Cardiovascular;  Laterality: N/A;  . Lower Extremity Arterial Dopplers  08/2013   ABI: R 0.96, L 1.04  . MULTIPLE TOOTH EXTRACTIONS  age 25  . PERCUTANEOUS CORONARY STENT INTERVENTION (PCI-S)  5/'01, 3/'02, 8/'03, 10/'04;   '01 - S660 BMS 2.5 x 9 - dSVG-D1 into D1; '02- post-stent stenosis - 2.5 x 8 Pixel BMS; '8\03: ISR/Thrombosis into native D1 - AngioJet, 2.5 x 13 Pixel; '04 - ISR 95% - covered stented area with Taxus DES 2.5 mm x 20 (2.88)  . PERCUTANEOUS CORONARY STENT INTERVENTION (PCI-S)  08/22/2013   mid SVG-D1 80%; distal stent ~50% ISR; Promus Prermier DES 2.75 mm xc 20 mm (2.8 mm)  . PERCUTANEOUS CORONARY STENT INTERVENTION (PCI-S)  08/23/2013   Procedure: PERCUTANEOUS CORONARY STENT INTERVENTION (PCI-S);  Surgeon: Wellington Hampshire, MD;  Location: [facility] CATH LAB;  Service: Cardiovascular;;  . PORT-A-CATH REMOVAL     kidney  . TRANSTHORACIC ECHOCARDIOGRAM  02/2013   NL LV Size, EF 55-60%; MAC with Mild MR.  . TUBAL LIGATION    . wrist fistula repair Left    dialysis for one year   Past Medical History:  Diagnosis Date  . Anxiety   . Bilateral carotid artery stenosis    Carotid duplex 09/5954: 3-87% LICA, 56-43% RICA, >32% RECA, f/u 1 yr suggested  . CAD (coronary artery disease) of bypass graft 5/01; 3/'02, 8/'03, 10/'04; 1/15   PCI x 5 to SVG-D1   . CAD in native artery 07/1993    3 Vessel Disease (LAD-D1 & RCA) -- CABG  . CAD S/P percutaneous coronary angioplasty    PCI to SVG-D1 insertion/native D1 x 4 = '01 -(S660 BMS 2.5 x 9 - insertion into D1; '02 - distal overlap ACS Pixel 2.5 x 8  BMS; '03 distal/native ISR/Thrombosis - Pixel 2.5 x 13; '04 - ISR-   Taxus 2.5 x 20 (covered all);; 1/15 - mid SVG-D1 (50% distal ISR) - Promus P 2.75 x 20 -- 2.8 mm  . COPD mixed type ([place])    Followed by Dr. Lamonte Sakai "pulmonologist said no COPD"  . Depression   . Depression with anxiety   . Diabetes mellitus type 2 in obese ([place])   . Dyslipidemia, goal LDL below 70    08/2012: TC 137, TG 200, HDL 32!, LDL 45; on statin (followed by Dr.Deterding)  . Enlarged heart    "  told one time"  . ESRD (end stage renal disease) ([place]) 1991   s/p Cadaveric Renal Transplant [facility] - Dr. Jimmy Footman)   . Fibromyalgia   . GERD (gastroesophageal reflux disease)   . Glomerulonephritis, chronic, rapidly progressive 88  . 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 07/1993   Rescue PTCA of RCA -- referred for CABG.  . H/O: GI bleed   . Hypertension associated with diabetes ([place])   . MRSA (methicillin resistant staph aureus) culture positive   . Obesity    s/p Lap Band 04/2004- Port Replacement 10/09 & 2/10 for infection; Dr. Excell Seltzer.  . OSA on CPAP    oxygen @night  @ 2L  . PAD (peripheral artery disease) ([place]) 08/2013   LEA Dopplers to be read by Dr. Fletcher Anon  . PAF (paroxysmal atrial fibrillation) ([place])    Noted on CardioNet Monitor 11/5-18 - rate ~112  . Recurrent boils    Bilateral Groin  . Rheumatoid arthritis ([place])    Per Patient Report; associated with OA  . 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 Servando Snare: LIMA-LAD, SVG-bifurcatingD1, SVG-rPDA  . S/p cadaver renal transplant [place]  . Stroke [facility]) 2012   "right eye stroke- half blind now"  . Unstable angina ([place]) 5/01; 3/'02, 8/'03, 10/'04; 1/15   x 5 occurences since Inf-Post STEMI in 1994   BP 133/90   Pulse (!) 101   Resp 14   SpO2 95%   Opioid Risk Score:   Fall Risk Score:  `1  Depression screen PHQ 2/9  Depression screen [facility] 2/9 05/19/2016 03/30/2016  Decreased Interest 3 3  Down, Depressed, Hopeless 3 3  PHQ - 2 Score 6 6  Altered sleeping - 3  Tired, decreased energy - 3    Change in appetite - 2  Feeling bad or failure about yourself  - 3  Trouble concentrating - 2  Moving slowly or fidgety/restless - 0  Suicidal thoughts - 0  PHQ-9 Score - 19  Some recent data might be hidden      Review of Systems  Constitutional: Positive for appetite change and unexpected weight change.  HENT: Negative.   Eyes: Negative.   Respiratory: Positive for cough, shortness of breath and wheezing.   Gastrointestinal: Positive for nausea.  Endocrine: Negative.   Genitourinary: Negative.   Musculoskeletal:       Limb swelling   Skin: Negative.   Allergic/Immunologic: Negative.   Neurological: Negative.   Hematological: Negative.   Psychiatric/Behavioral: Negative.        Objective:   Physical Exam  General: Alert and oriented x 3, No apparent distress. She is overweight HEENT:PERRL, EOMI Neck:supple Heart:RRR - murmurs rubs or gallops Chest:CTA bilaterally without wheezes, rales, or rhonchi; no distress Abdomen:Soft, non-tender, non-distended, bowel sounds positive. Extremities:pulses 2+ Skin:Clean and intact without signs of breakdown. Has dry skin in places. Numerous skin tags Neuro: CN normal. Motor 5/5  Musculoskeletal:right shoulder tender with IR/ER/ABD. Impingement positive. Would not allow me to range the shoulder very far due to pain. She has no gross rheumatoid deformities in the hands or feet.    Both knees exhibited mild effusion but minimal crepitus. Joint line pain was provoked at either knee medially and latetrally.  Psych:Pt's affect is appropriate. Pt is cooperative       Assessment & Plan:  1. Fibromyalgia 2. Rheumatoid Arthritis by history 3. Osteoarthritis--polyarticular 4. ESRD with hx of renal transplant on chronic steroids 5. Right rotator cuff tendonitis/like degenerative/inflammatory changes  in the right shoulder also.  6. Hx o falls   Plan: 1. Reviewed use of a cane for support when she's walking longer  dx outside the house. I think she can be without at home. Common sense is involved too. 2. oxycodone 5mg  q8 prn, #60--no RF today 3. Continue cymbalta to 60mg  daily, decrease zoloft to 100mg . Eventually  wean zoloft to off moving forward given high risk of serotonin syndrome.   4. Continue tegretol  200mg  TID  5. After informed consent and preparation of the skin with betadine and isopropyl alcohol, I injected 6mg  (1cc) of celestone and 4cc of 1% lidocaine into the right shoulder via lateral approach. Additionally, aspiration was performed prior to injection. The patient tolerated well, and no complications were encountered. Afterward the area was cleaned and dressed. Post- injection instructions were provided. Consider imaging of right shoulder depending upon progress. I provided her extensive RTC stretching exercises today  6. Still will consider formal PT vs aquatic based/low impact HEP at some point    15 minutes of face to face patient care time were spent during this visit. All questions were encouraged and answered. Return visit in s.

## 2016-07-03 ENCOUNTER — Other Ambulatory Visit: Payer: Self-pay | Admitting: Physical Medicine & Rehabilitation

## 2016-07-04 ENCOUNTER — Other Ambulatory Visit: Payer: Self-pay | Admitting: Emergency Medicine

## 2016-07-10 LAB — TOXASSURE SELECT,+ANTIDEPR,UR

## 2016-07-17 ENCOUNTER — Encounter: Payer: Self-pay | Admitting: Emergency Medicine

## 2016-07-17 ENCOUNTER — Ambulatory Visit (HOSPITAL_COMMUNITY)
Admission: RE | Admit: 2016-07-17 | Discharge: 2016-07-17 | Disposition: A | Discharge status: Home / Self Care | Payer: Medicare Other | Source: Ambulatory Visit | Attending: Emergency Medicine | Admitting: Emergency Medicine

## 2016-07-17 ENCOUNTER — Ambulatory Visit (INDEPENDENT_AMBULATORY_CARE_PROVIDER_SITE_OTHER): Payer: Medicare Other | Admitting: Emergency Medicine

## 2016-07-17 DIAGNOSIS — R05 Cough: Secondary | ICD-10-CM

## 2016-07-17 DIAGNOSIS — J301 Allergic rhinitis due to pollen: Secondary | ICD-10-CM

## 2016-07-17 DIAGNOSIS — K219 Gastro-esophageal reflux disease without esophagitis: Secondary | ICD-10-CM | POA: Diagnosis not present

## 2016-07-17 DIAGNOSIS — N182 Chronic kidney disease, stage 2 (mild): Secondary | ICD-10-CM | POA: Diagnosis not present

## 2016-07-17 DIAGNOSIS — R0602 Shortness of breath: Secondary | ICD-10-CM | POA: Diagnosis not present

## 2016-07-17 DIAGNOSIS — J45909 Unspecified asthma, uncomplicated: Secondary | ICD-10-CM | POA: Diagnosis not present

## 2016-07-17 DIAGNOSIS — R059 Cough, unspecified: Secondary | ICD-10-CM

## 2016-07-17 DIAGNOSIS — E118 Type 2 diabetes mellitus with unspecified complications: Secondary | ICD-10-CM | POA: Diagnosis not present

## 2016-07-17 DIAGNOSIS — I6521 Occlusion and stenosis of right carotid artery: Secondary | ICD-10-CM

## 2016-07-17 LAB — PULMONARY FUNCTION TEST
DL/VA % PRED: 86 %
DL/VA: 3.79 ml/min/mmHg/L
DLCO UNC: 13.91 ml/min/mmHg
DLCO unc % pred: 68 %
FEF 25-75 POST: 2.93 L/s
FEF 25-75 Pre: 2.03 L/sec
FEF2575-%Change-Post: 44 %
FEF2575-%PRED-PRE: 91 %
FEF2575-%Pred-Post: 131 %
FEV1-%Change-Post: 7 %
FEV1-%PRED-POST: 94 %
FEV1-%PRED-PRE: 87 %
FEV1-PRE: 2.02 L
FEV1-Post: 2.17 L
FEV1FVC-%CHANGE-POST: 6 %
FEV1FVC-%Pred-Pre: 103 %
FEV6-%CHANGE-POST: 0 %
FEV6-%PRED-PRE: 87 %
FEV6-%Pred-Post: 87 %
FEV6-POST: 2.53 L
FEV6-PRE: 2.51 L
FEV6FVC-%PRED-POST: 103 %
FEV6FVC-%PRED-PRE: 103 %
FVC-%Change-Post: 0 %
FVC-%Pred-Post: 85 %
FVC-%Pred-Pre: 84 %
FVC-POST: 2.53 L
FVC-Pre: 2.51 L
POST FEV6/FVC RATIO: 100 %
PRE FEV1/FVC RATIO: 80 %
Post FEV1/FVC ratio: 86 %
Pre FEV6/FVC Ratio: 100 %
RV % pred: 75 %
RV: 1.37 L
TLC % PRED: 84 %
TLC: 3.91 L

## 2016-07-17 MED ORDER — ALBUTEROL SULFATE (2.5 MG/3ML) 0.083% IN NEBU
2.5000 mg | INHALATION_SOLUTION | Freq: Once | RESPIRATORY_TRACT | Status: AC
  Administered 2016-07-17: 2.5 mg via RESPIRATORY_TRACT

## 2016-07-17 NOTE — Progress Notes (Signed)
60 year old female patient has a known history of obesity, status post lap band surgery with significant weight loss. Patient also has a history of renal transplant on chronic immunosuppression with Imuran and prednisone. The patient has a history of sleep apnea now much improved after significant weight loss.  Still needs repeat PSG to prove that OSA has resolved. She has stopped using CPAP.   May 15, 2009 --Present for an acute office visit. Complains of coughing and chest tightness/congestion x 4wks. The cough has started a month ago and had one episode of bloody-tinged phlegm. Didn't take any OTC to help the cough. Has had some episode of vomitted after eating , this is chronic since lap band. Denies chest pain, abd. pain, melena. orthopnea, hemoptysis, fever, n/v/d, edema, headache   ROV 12/18/09 -- ROV to follow OSA/OHS, hx lap band sgy and GERD/reflux. Tells me that she was exposed to cleaning solution, bleach in the last week. Having some chest tightness, dry cough. Doing sinus rinses + nasonex. Taking omeprazole.   ROV 01/23/10 -- Acute visit. Has had fever, aches, head congestion, tight chest. Has had some dry cough, some nausea. Having trouble getting a deep breath.   July 30, 2010 --Presents for an acute office viist. Complains of sinus pressure/congestion, PND and sore throat, right ear discomfort, prod cough with yellow mucus, increased SOB, wheezing x 2 weeks . Has been doing well until last 2 weeks. Does not want to be sick for the holidays, family is coming in to visit.Denies chest pain, orthopnea, hemoptysis, fever, n/v/d, edema. Has alot of sinus congestion and pressure.   ROV 04/29/11 -- OSA/OHS, hx lap band sgy and GERD/reflux, often assoc w cough. She was hospitalized in Jan - Feb '12 for aspiration of gastric contents. She had to have the lap-band loosened because it was contributing to the aspiration. She gained 40 lbs and hasn't lost it. She presents today c/o sinus  congestion, HA beginning 1 month ago, some drainage to back of throat. Has been on her flonase and singulair. Doesn't use pulmicort anymore. No purulent mucous, no fever.  >>saline nasal rinses   05/06/11  Acute OV  Patient is no better since last visit with Dr. Lamonte Sakai. Patient c/o right ear pain, sore throat is worse since last visit.  Also feels fatigued. Throat is very sore . Singulair , flonase and saline rinses are not helping.  OTC is not helping.  >>Omnicef rx   Acute OV 12/23/11 --  Patient c/o ear pain in both ears, sore throat, chest tightness, chest pain mostly in her back, cough with thick stringy light yellow mucus, and wheezing that is mostly at night x 2 weeks. Just finished Doxy for boil.  CXR today shows  No acute process.  No fever or hemoptysis   ROV 02/05/12 -- OSA/OHS, hx lap band sgy and GERD/reflux, often assoc w cough. Treated for persistent cough and drainage as above. She is still experiencing some drainage, tolerating sinus washes and flonase. Still has frequent reflux/emesis - almost certainly causing UA irritation. [place] follows her lap-band.  Took diflucan for thrush 2 weeks ago.  She is having pain on inspiration, especially when she lays down supine.   ROV 04/02/15 -- Follow-up visit for chronic cough in the setting of allergic rhinitis and GERD. She also has obstructive sleep apnea and a history of significant weight loss after lap band surgery, hx of a renal transplant.  She has been maintained on singular and flonase, but ran out of these  about 1-2 months ago. Since she ran out she is having more nasal congestion, chest congestion.   She has gained about 35 lbs since our last visit in 2013. Her lap band was loosened due to reflux and aspiration over the last several years. Remains prednisone and imuran.  She does not have daytime sleepiness. No naps. She is unsure about whether she snores.   ROV 06/16/16 -- patient has a history of obstructive sleep apnea that  improved after she had lab band surgery, also history of renal transplant, allergic rhinitis, GERD and an upper airway irritation syndrome with cough. At her last visit we considered reordering a polysomnogram because she gained weight. We deferred this. Also we had planned to do poorly function testing to try and quantify and prove asthma. She reports that she has been having a lot of wheeze, cough, dyspnea and wheeze. Has been especially bad during the allergy season. She hears wheeze at night, also in the am when she wakes up. She does have some breakthru reflux sx on omeprazole. She is on flonase, singulair. She is taking ventolin, was started on ICS last time here, has had some thrush.   ROV 07/17/16 -- this follow-up visit for dyspnea in the setting of obstructive lung disease, upper airway irritation syndrome, cough, she has a history of GERD, allergic rhinitis, renal transplant on immunosuppression. She used to have obstructive sleep apnea but this resolved after she lost weight following lap band surgery. Last visit I increase omeprazole to bid for breakthrough GERD and UA symptoms. I also changed her Pulmicort to Symbicort to see if she would benefit. She underwent pulmonary function testing today and I have personally reviewed. This shows normal spirometry but curve to the flow volume loops suggestive of mild obstruction. Restricted volumes. Her reflux is better. Still unable to sing, but UA noise better as well.    PE :  Vitals:   07/17/16 1603 07/17/16 1604  BP:  118/80  Pulse:  72  SpO2:  96%  Weight: 190 lb (86.2 kg)   Height: 5\' 1"  (1.549 m)    Gen: Pleasant, obese, has gained wt, in no distress,  normal affect  ENT post pharynx erythema, no purulence  Neck: No JVD, no TMG, no carotid bruits  Lungs: No use of accessory muscles, no dullness to percussion, clear without rales or rhonchi  Cardiovascular: RRR, heart sounds normal, no murmur or gallops, no peripheral  edema  Musculoskeletal: No deformities, no cyanosis or clubbing  Neuro: alert, non focal  Skin: Warm, no lesions or rashes  Extrinsic asthma Her current pulmonary function testing is suggestive of mixed disease primarily based on the curve her for flow volume loop. She feels that she clinically benefited from the addition of Symbicort. We will continue this as well as albuterol as needed. We will continue to try and aggressively manage the known irritants for her upper airway disease.  Chronic allergic rhinitis Continue singulair and fluticasone nasal spray  GERD She appears to have benefited from the increase in her omeprazole. We will continue 40 mg twice a day.  COUGH, CHRONIC Respiratory treatment of GERD and allergic rhinitis. She will remember to gargle and rinse after taking her Symbicort.   Baltazar Apo, MD, PhD 07/17/2016, 4:26 PM [facility] [phone] or if no answer [phone]

## 2016-07-17 NOTE — Patient Instructions (Addendum)
We will continue Symbicort 2 puffs twice a day Take albuterol 2 puffs up to every 4 hours if needed for shortness of breath.  Continue your omeprazole 40 mg twice a day Follow with Dr Lamonte Sakai in 4 months or sooner if you have any problems.

## 2016-07-17 NOTE — Assessment & Plan Note (Signed)
Her current pulmonary function testing is suggestive of mixed disease primarily based on the curve her for flow volume loop. She feels that she clinically benefited from the addition of Symbicort. We will continue this as well as albuterol as needed. We will continue to try and aggressively manage the known irritants for her upper airway disease.

## 2016-07-17 NOTE — Assessment & Plan Note (Signed)
She appears to have benefited from the increase in her omeprazole. We will continue 40 mg twice a day.

## 2016-07-17 NOTE — Assessment & Plan Note (Signed)
Continue singulair and fluticasone nasal spray

## 2016-07-17 NOTE — Assessment & Plan Note (Signed)
Respiratory treatment of GERD and allergic rhinitis. She will remember to gargle and rinse after taking her Symbicort.

## 2016-07-18 ENCOUNTER — Other Ambulatory Visit: Payer: Self-pay | Admitting: Cardiology

## 2016-07-20 NOTE — Telephone Encounter (Signed)
Rx(s) sent to pharmacy electronically.  

## 2016-07-22 DIAGNOSIS — N390 Urinary tract infection, site not specified: Secondary | ICD-10-CM | POA: Diagnosis not present

## 2016-07-27 DIAGNOSIS — I129 Hypertensive chronic kidney disease with stage 1 through stage 4 chronic kidney disease, or unspecified chronic kidney disease: Secondary | ICD-10-CM | POA: Diagnosis not present

## 2016-07-27 DIAGNOSIS — I2510 Atherosclerotic heart disease of native coronary artery without angina pectoris: Secondary | ICD-10-CM | POA: Diagnosis not present

## 2016-07-27 DIAGNOSIS — I639 Cerebral infarction, unspecified: Secondary | ICD-10-CM | POA: Diagnosis not present

## 2016-07-27 DIAGNOSIS — E118 Type 2 diabetes mellitus with unspecified complications: Secondary | ICD-10-CM | POA: Diagnosis not present

## 2016-07-27 DIAGNOSIS — D631 Anemia in chronic kidney disease: Secondary | ICD-10-CM | POA: Diagnosis not present

## 2016-07-27 DIAGNOSIS — Z940 Kidney transplant status: Secondary | ICD-10-CM | POA: Diagnosis not present

## 2016-07-27 DIAGNOSIS — E669 Obesity, unspecified: Secondary | ICD-10-CM | POA: Diagnosis not present

## 2016-07-27 DIAGNOSIS — N182 Chronic kidney disease, stage 2 (mild): Secondary | ICD-10-CM | POA: Diagnosis not present

## 2016-07-27 DIAGNOSIS — N2581 Secondary hyperparathyroidism of renal origin: Secondary | ICD-10-CM | POA: Diagnosis not present

## 2016-07-27 DIAGNOSIS — E785 Hyperlipidemia, unspecified: Secondary | ICD-10-CM | POA: Diagnosis not present

## 2016-07-27 DIAGNOSIS — I4891 Unspecified atrial fibrillation: Secondary | ICD-10-CM | POA: Diagnosis not present

## 2016-07-27 DIAGNOSIS — I739 Peripheral vascular disease, unspecified: Secondary | ICD-10-CM | POA: Diagnosis not present

## 2016-08-12 ENCOUNTER — Encounter: Payer: Self-pay | Admitting: Physical Medicine & Rehabilitation

## 2016-08-12 ENCOUNTER — Ambulatory Visit
Admission: RE | Admit: 2016-08-12 | Discharge: 2016-08-12 | Disposition: A | Discharge status: Home / Self Care | Payer: Medicare Other | Source: Ambulatory Visit | Attending: Physical Medicine & Rehabilitation | Admitting: Physical Medicine & Rehabilitation

## 2016-08-12 ENCOUNTER — Encounter: Payer: Medicare Other | Attending: Physical Medicine & Rehabilitation | Admitting: Physical Medicine & Rehabilitation

## 2016-08-12 VITALS — BP 129/87 | HR 95

## 2016-08-12 DIAGNOSIS — Z940 Kidney transplant status: Secondary | ICD-10-CM | POA: Insufficient documentation

## 2016-08-12 DIAGNOSIS — E1122 Type 2 diabetes mellitus with diabetic chronic kidney disease: Secondary | ICD-10-CM | POA: Insufficient documentation

## 2016-08-12 DIAGNOSIS — M797 Fibromyalgia: Secondary | ICD-10-CM | POA: Diagnosis not present

## 2016-08-12 DIAGNOSIS — Z951 Presence of aortocoronary bypass graft: Secondary | ICD-10-CM | POA: Insufficient documentation

## 2016-08-12 DIAGNOSIS — I6521 Occlusion and stenosis of right carotid artery: Secondary | ICD-10-CM | POA: Diagnosis not present

## 2016-08-12 DIAGNOSIS — Z7952 Long term (current) use of systemic steroids: Secondary | ICD-10-CM | POA: Diagnosis not present

## 2016-08-12 DIAGNOSIS — M7581 Other shoulder lesions, right shoulder: Secondary | ICD-10-CM

## 2016-08-12 DIAGNOSIS — M159 Polyosteoarthritis, unspecified: Secondary | ICD-10-CM | POA: Diagnosis not present

## 2016-08-12 DIAGNOSIS — F419 Anxiety disorder, unspecified: Secondary | ICD-10-CM | POA: Insufficient documentation

## 2016-08-12 DIAGNOSIS — M25511 Pain in right shoulder: Secondary | ICD-10-CM | POA: Diagnosis not present

## 2016-08-12 DIAGNOSIS — I2510 Atherosclerotic heart disease of native coronary artery without angina pectoris: Secondary | ICD-10-CM | POA: Diagnosis not present

## 2016-08-12 DIAGNOSIS — I120 Hypertensive chronic kidney disease with stage 5 chronic kidney disease or end stage renal disease: Secondary | ICD-10-CM | POA: Diagnosis not present

## 2016-08-12 DIAGNOSIS — N186 End stage renal disease: Secondary | ICD-10-CM

## 2016-08-12 DIAGNOSIS — M47816 Spondylosis without myelopathy or radiculopathy, lumbar region: Secondary | ICD-10-CM

## 2016-08-12 DIAGNOSIS — Z20828 Contact with and (suspected) exposure to other viral communicable diseases: Secondary | ICD-10-CM | POA: Diagnosis not present

## 2016-08-12 NOTE — Patient Instructions (Signed)
PLEASE CALL ME WITH ANY PROBLEMS OR QUESTIONS ([phone])   HAPPY HOLIDAYS!!!!                    *                * *             *   *   *         *  *   *  *  *     *  *  *  *  *  *  * *  *  *  *  *  *  *  *  *  * *               *  *               *  *               *  *  

## 2016-08-12 NOTE — Progress Notes (Signed)
Subjective:    Patient ID: Katherine [name], female    DOB: [birth year], 60 y.o.   MRN: [id number]  HPI   Katherine [name] is here in follow up of her chronic pain. She had positive results with the right shoulder injection although it took "about 2 weeks to take effect". She also fell about 2 weeks ago stepping over a dog fence. She suffered a lot of bruises and bumps to her legs which she watned me to examine.   She has felt a little "weak" over the last couple weeks and is concerned she might have been exposed to "mono" by her niece. She is going for labs to assess this week.   She is using her oxycodone sparingly. She tries to stay active as she can around home.   Pain Inventory Average Pain 7 Pain Right Now 7 My pain is sharp, burning and aching  In the last 24 hours, has pain interfered with the following? General activity 4 Relation with others 4 Enjoyment of life 7 What TIME of day is your pain at its worst? all Sleep (in general) Poor  Pain is worse with: walking, bending, standing and some activites Pain improves with: rest, heat/ice and medication Relief from Meds: 5  Mobility walk without assistance ability to climb steps?  yes do you drive?  yes Do you have any goals in this area?  yes  Function disabled: date disabled 1989 Do you have any goals in this area?  yes  Neuro/Psych weakness numbness tingling trouble walking spasms dizziness depression anxiety  Prior Studies Any changes since last visit?  no  Physicians involved in your care Any changes since last visit?  no   Family History  Problem Relation Age of Onset  . Cancer Mother     liver  . Heart disease Father   . Cancer Father     colon  . Arrhythmia Brother     Atrial Fibrillation  . Arrhythmia Paternal Aunt     Atrial Fibrillation   Social History   Social History  . Marital status: Married    Spouse name: N/A  . Number of children: N/A  . Years of education: N/A   Social  History Main Topics  . Smoking status: Former Smoker    Packs/day: 1.00    Years: 30.00    Types: Cigarettes    Quit date: 08/17/2002  . Smokeless tobacco: Never Used  . Alcohol use No  . Drug use: No  . Sexual activity: Not on file   Other Topics Concern  . 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 Prior to his diagnosis of her husband, she actually had adopted a 58-year-old child who she knows caring for as well. With all the surgeries, they have been quite financially troubled. Thanks the help of her community and church, they have been able to stay "alfoat."     She is a former smoker who quit in 2004 after a 30-pack-year history.   She is active chasing a 15-year-old child, does not do routine exercise. She's been quite depressed with the condition of her husband, and admits to eating comfort herself.   She does not drink alcohol.   Past Surgical History:  Procedure Laterality Date  . CARDIAC CATHETERIZATION  5/'01, 3/'02, 8/'03, 10/'04; 1/'15   08/22/2013: LAD & RCA 100%; LIMA-LAD &  SVG-rPDA patent; Cx-- OM1 60%, OM2 ostial ~50%; SVG-D1 - 80% mid, 50% distal ISR --PCI  . CARDIAC CATHETERIZATION  5/'01, 3/'02, 8/'03, 10/'04; 1/'15   08/22/2013: LAD & RCA 100%; LIMA-LAD & SVG-rPDA patent; Cx-- OM1 60%, OM2 ostial ~50%; SVG-D1 - 80% mid, 50% distal ISR --PCI  . CATHETER REMOVAL    . CHOLECYSTECTOMY N/A 10/29/2014   Procedure: LAPAROSCOPIC CHOLECYSTECTOMY WITH INTRAOPERATIVE CHOLANGIOGRAM;  Surgeon: Excell Seltzer, MD;  Location: [facility];  Service: General;  Laterality: N/A;  . CORONARY ANGIOPLASTY  1994   x5  . CORONARY ARTERY BYPASS GRAFT  1995   LIMA-LAD, SVG-RPDA, SVG-D1  . INCISE AND DRAIN ABCESS    . KIDNEY TRANSPLANT  1991  . LAPAROSCOPIC GASTRIC BANDING  04/2004; 10/'09, 2/'10   Port Replacement x 2  . LEFT HEART CATHETERIZATION WITH  CORONARY/GRAFT ANGIOGRAM N/A 08/23/2013   Procedure: LEFT HEART CATHETERIZATION WITH Beatrix Fetters;  Surgeon: Wellington Hampshire, MD;  Location: [facility] CATH [facility];  Service: Cardiovascular;  Laterality: N/A;  . Lower Extremity Arterial Dopplers  08/2013   ABI: R 0.96, L 1.04  . MULTIPLE TOOTH EXTRACTIONS  age 46  . PERCUTANEOUS CORONARY STENT INTERVENTION (PCI-S)  5/'01, 3/'02, 8/'03, 10/'04;   '01 - S660 BMS 2.5 x 9 - dSVG-D1 into D1; '02- post-stent stenosis - 2.5 x 8 Pixel BMS; '8\03: ISR/Thrombosis into native D1 - AngioJet, 2.5 x 13 Pixel; '04 - ISR 95% - covered stented area with Taxus DES 2.5 mm x 20 (2.88)  . PERCUTANEOUS CORONARY STENT INTERVENTION (PCI-S)  08/22/2013   mid SVG-D1 80%; distal stent ~50% ISR; Promus Prermier DES 2.75 mm xc 20 mm (2.8 mm)  . PERCUTANEOUS CORONARY STENT INTERVENTION (PCI-S)  08/23/2013   Procedure: PERCUTANEOUS CORONARY STENT INTERVENTION (PCI-S);  Surgeon: Wellington Hampshire, MD;  Location: [facility] CATH LAB;  Service: Cardiovascular;;  . PORT-A-CATH REMOVAL     kidney  . TRANSTHORACIC ECHOCARDIOGRAM  02/2013   NL LV Size, EF 55-60%; MAC with Mild MR.  . TUBAL LIGATION    . wrist fistula repair Left    dialysis for one year   Past Medical History:  Diagnosis Date  . Anxiety   . Bilateral carotid artery stenosis    Carotid duplex 02/8294: 6-21% LICA, 30-86% RICA, >57% RECA, f/u 1 yr suggested  . CAD (coronary artery disease) of bypass graft 5/01; 3/'02, 8/'03, 10/'04; 1/15   PCI x 5 to SVG-D1   . CAD in native artery 07/1993    3 Vessel Disease (LAD-D1 & RCA) -- CABG  . CAD S/P percutaneous coronary angioplasty    PCI to SVG-D1 insertion/native D1 x 4 = '01 -(S660 BMS 2.5 x 9 - insertion into D1; '02 - distal overlap ACS Pixel 2.5 x 8  BMS; '03 distal/native ISR/Thrombosis - Pixel 2.5 x 13; '04 - ISR-  Taxus 2.5 x 20 (covered all);; 1/15 - mid SVG-D1 (50% distal ISR) - Promus P 2.75 x 20 -- 2.8 mm  . COPD mixed type ([place])    Followed by Dr. Lamonte Sakai  "pulmonologist said no COPD"  . Depression   . Depression with anxiety   . Diabetes mellitus type 2 in obese ([place])   . Dyslipidemia, goal LDL below 70    08/2012: TC 137, TG 200, HDL 32!, LDL 45; on statin (followed by Dr.Deterding)  . Enlarged heart    "told one time"  . ESRD (end stage renal disease) ([place]) 1991   s/p Cadaveric Renal Transplant [facility] - Dr. Jimmy Footman)   .  Fibromyalgia   . GERD (gastroesophageal reflux disease)   . Glomerulonephritis, chronic, rapidly progressive 68  . 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 07/1993   Rescue PTCA of RCA -- referred for CABG.  . H/O: GI bleed   . Hypertension associated with diabetes ([place])   . MRSA (methicillin resistant staph aureus) culture positive   . Obesity    s/p Lap Band 04/2004- Port Replacement 10/09 & 2/10 for infection; Dr. Excell Seltzer.  . OSA on CPAP    oxygen @night  @ 2L  . PAD (peripheral artery disease) ([place]) 08/2013   LEA Dopplers to be read by Dr. Fletcher Anon  . PAF (paroxysmal atrial fibrillation) ([place])    Noted on CardioNet Monitor 11/5-18 - rate ~112  . Recurrent boils    Bilateral Groin  . Rheumatoid arthritis ([place])    Per Patient Report; associated with OA  . 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 Servando Snare: LIMA-LAD, SVG-bifurcatingD1, SVG-rPDA  . S/p cadaver renal transplant [place]  . Stroke [facility]) 2012   "right eye stroke- half blind now"  . Unstable angina ([place]) 5/01; 3/'02, 8/'03, 10/'04; 1/15   x 5 occurences since Inf-Post STEMI in 1994   There were no vitals taken for this visit.  Opioid Risk Score:   Fall Risk Score:  `1  Depression screen PHQ 2/9  Depression screen [facility] 2/9 05/19/2016 03/30/2016  Decreased Interest 3 3  Down, Depressed, Hopeless 3 3  PHQ - 2 Score 6 6  Altered sleeping - 3  Tired, decreased energy - 3  Change in appetite - 2  Feeling bad or failure about yourself  - 3  Trouble concentrating - 2  Moving slowly or fidgety/restless - 0  Suicidal thoughts - 0    PHQ-9 Score - 19  Some recent data might be hidden   Review of Systems  HENT: Negative.   Eyes: Negative.   Respiratory: Negative.   Cardiovascular: Negative.   Gastrointestinal: Negative.   Endocrine: Negative.   Genitourinary: Negative.   Musculoskeletal: Positive for gait problem.       Spasms  Skin: Negative.   Allergic/Immunologic: Negative.   Neurological: Positive for dizziness, weakness and numbness.       Tingling  Hematological: Negative.   Psychiatric/Behavioral: Positive for dysphoric mood. The patient is nervous/anxious.   All other systems reviewed and are negative.      Objective:   Physical Exam  General: Alert and oriented x 3, No apparent distress. She is overweight HEENT:PERRL, EOMI Neck:supple Heart:RRR - murmurs rubs or gallops Chest:CTA bilaterally without wheezes, rales, or rhonchi; no distress Abdomen:Soft, non-tender, non-distended, bowel sounds positive. Extremities:pulses 2+ Skin:Clean and intact without signs of breakdown. Has dry skin in places. Numerous bruises bilateral legs, with large hematoma/knot along right medial calf---no warmth or frank calf tenderness. No erythema.  Neuro: CN normal. Motor 5/5  Musculoskeletal:right shoulder with ongoing pain with IR/ER/ABD. Impingement positive. Can not abduct past 60 degrees.  She has no gross rheumatoid deformities in the hands or feet.   Both knees exhibited mild effusion but minimal crepitus. Joint line pain was provoked at either knee medially and latetrally.  Psych:Pt's affect is appropriate. Pt is cooperative       Assessment & Plan:  1. Fibromyalgia 2. Rheumatoid Arthritis by history 3. Osteoarthritis--polyarticular 4. ESRD with hx of renal transplant on chronic steroids 5. Right rotator cuff tendonitis/like degenerative/inflammatory changes in the right shoulder also.  6. Hx o falls, recent fall  with multiple hematomas/bruises   Plan: 1. Discussed safety  awareness today 2. oxycodone 5mg  q8 prn, #60--no RF today 3. Continuecymbalta to 60mg  daily, decrease zoloft to 100mg . Eventually  weanzoloft to offmoving forward given high risk of serotonin syndrome.  4. Continue tegretol but reduce to  200mg  BID 5. After informed consent and preparation of the skin with betadine and isopropyl alcohol, I injected 6mg  (1cc) of celestone and 4cc of 1% lidocaine into  the right shoulder via lateral approach. Additionally, aspiration was performed prior to injection. The patient tolerated well, and no complications were encountered. Afterward the area was cleaned and dressed. Post- injection instructions were provided.   6. Reviewed legs/ bruises--reassured her that these will resolve with time. Needs to be more aware of safety as above.  7. Ordered xr right shoulder---will discuss at follow up 8. Still will consider formal PT vs aquatic based/low impact HEP at some point    29minutes of face to face patient care time were spent during this visit. All questions were encouraged and answered. Return visit in one month

## 2016-08-13 ENCOUNTER — Telehealth: Payer: Self-pay | Admitting: Physical Medicine & Rehabilitation

## 2016-08-13 ENCOUNTER — Other Ambulatory Visit: Payer: Self-pay | Admitting: Cardiology

## 2016-08-13 NOTE — Telephone Encounter (Signed)
Please let Katherine [name] know there is no major degenerative or erosive disease on xray of the shoulder. If she has persistent pain in her shoulder, we'll likely need to pursue an MRI

## 2016-08-13 NOTE — Telephone Encounter (Signed)
REFILL 

## 2016-08-14 NOTE — Telephone Encounter (Signed)
Contacted patient, informed patient of Dr. Charm Barges findings. Patient acknowledged

## 2016-08-28 ENCOUNTER — Other Ambulatory Visit: Payer: Self-pay | Admitting: Emergency Medicine

## 2016-08-31 ENCOUNTER — Ambulatory Visit
Admission: RE | Admit: 2016-08-31 | Discharge: 2016-08-31 | Disposition: A | Discharge status: Home / Self Care | Payer: Medicare Other | Source: Ambulatory Visit | Attending: General Surgery | Admitting: General Surgery

## 2016-08-31 ENCOUNTER — Other Ambulatory Visit: Payer: Self-pay | Admitting: General Surgery

## 2016-08-31 DIAGNOSIS — R1084 Generalized abdominal pain: Secondary | ICD-10-CM

## 2016-08-31 DIAGNOSIS — R1033 Periumbilical pain: Secondary | ICD-10-CM | POA: Diagnosis not present

## 2016-09-08 DIAGNOSIS — Z9884 Bariatric surgery status: Secondary | ICD-10-CM | POA: Diagnosis not present

## 2016-09-08 DIAGNOSIS — R1013 Epigastric pain: Secondary | ICD-10-CM | POA: Diagnosis not present

## 2016-09-09 ENCOUNTER — Ambulatory Visit: Payer: Medicare Other | Admitting: Physical Medicine & Rehabilitation

## 2016-09-14 ENCOUNTER — Encounter: Payer: Medicare Other | Admitting: Physical Medicine & Rehabilitation

## 2016-09-29 ENCOUNTER — Other Ambulatory Visit: Payer: Self-pay | Admitting: Physical Medicine & Rehabilitation

## 2016-09-29 DIAGNOSIS — M797 Fibromyalgia: Secondary | ICD-10-CM

## 2016-09-29 DIAGNOSIS — E1142 Type 2 diabetes mellitus with diabetic polyneuropathy: Secondary | ICD-10-CM

## 2016-09-30 ENCOUNTER — Encounter: Payer: Medicare Other | Admitting: Physical Medicine & Rehabilitation

## 2016-10-07 ENCOUNTER — Telehealth: Payer: Self-pay | Admitting: *Deleted

## 2016-10-07 NOTE — Telephone Encounter (Signed)
Patient left a message requesting refill on her pain medication.  She cancelled her 09/30/2016 appointment due to 'family issues'.  She is rescheduled for 10/19/2016. She will run out before then.  Do you want her to reschedule with [name] or would you like to provide a script for her to pick up  at the front desk? Currently, you have no availability

## 2016-10-09 ENCOUNTER — Encounter: Payer: Medicare Other | Admitting: Registered Nurse

## 2016-10-13 ENCOUNTER — Inpatient Hospital Stay (HOSPITAL_COMMUNITY)
Admission: EM | Admit: 2016-10-13 | Discharge: 2016-10-17 | DRG: 377 | Disposition: A | Discharge status: Home / Self Care | Payer: Medicare Other | Attending: Internal Medicine | Admitting: Internal Medicine

## 2016-10-13 ENCOUNTER — Other Ambulatory Visit: Payer: Self-pay | Admitting: Emergency Medicine

## 2016-10-13 ENCOUNTER — Ambulatory Visit: Payer: Medicare Other | Admitting: Registered Nurse

## 2016-10-13 ENCOUNTER — Emergency Department (HOSPITAL_COMMUNITY): Payer: Medicare Other

## 2016-10-13 ENCOUNTER — Encounter (HOSPITAL_COMMUNITY): Payer: Self-pay | Admitting: Emergency Medicine

## 2016-10-13 DIAGNOSIS — E1122 Type 2 diabetes mellitus with diabetic chronic kidney disease: Secondary | ICD-10-CM | POA: Diagnosis present

## 2016-10-13 DIAGNOSIS — Z940 Kidney transplant status: Secondary | ICD-10-CM | POA: Diagnosis not present

## 2016-10-13 DIAGNOSIS — B3781 Candidal esophagitis: Secondary | ICD-10-CM | POA: Diagnosis not present

## 2016-10-13 DIAGNOSIS — E1159 Type 2 diabetes mellitus with other circulatory complications: Secondary | ICD-10-CM | POA: Diagnosis present

## 2016-10-13 DIAGNOSIS — K2900 Acute gastritis without bleeding: Secondary | ICD-10-CM | POA: Diagnosis present

## 2016-10-13 DIAGNOSIS — Z809 Family history of malignant neoplasm, unspecified: Secondary | ICD-10-CM

## 2016-10-13 DIAGNOSIS — G4730 Sleep apnea, unspecified: Secondary | ICD-10-CM | POA: Diagnosis present

## 2016-10-13 DIAGNOSIS — I252 Old myocardial infarction: Secondary | ICD-10-CM

## 2016-10-13 DIAGNOSIS — R1030 Lower abdominal pain, unspecified: Secondary | ICD-10-CM | POA: Diagnosis not present

## 2016-10-13 DIAGNOSIS — I10 Essential (primary) hypertension: Secondary | ICD-10-CM

## 2016-10-13 DIAGNOSIS — I2510 Atherosclerotic heart disease of native coronary artery without angina pectoris: Secondary | ICD-10-CM | POA: Diagnosis present

## 2016-10-13 DIAGNOSIS — E1169 Type 2 diabetes mellitus with other specified complication: Secondary | ICD-10-CM | POA: Diagnosis present

## 2016-10-13 DIAGNOSIS — Z9889 Other specified postprocedural states: Secondary | ICD-10-CM

## 2016-10-13 DIAGNOSIS — J449 Chronic obstructive pulmonary disease, unspecified: Secondary | ICD-10-CM | POA: Diagnosis present

## 2016-10-13 DIAGNOSIS — F329 Major depressive disorder, single episode, unspecified: Secondary | ICD-10-CM | POA: Diagnosis present

## 2016-10-13 DIAGNOSIS — Z8249 Family history of ischemic heart disease and other diseases of the circulatory system: Secondary | ICD-10-CM

## 2016-10-13 DIAGNOSIS — M069 Rheumatoid arthritis, unspecified: Secondary | ICD-10-CM | POA: Diagnosis present

## 2016-10-13 DIAGNOSIS — Z9049 Acquired absence of other specified parts of digestive tract: Secondary | ICD-10-CM

## 2016-10-13 DIAGNOSIS — D539 Nutritional anemia, unspecified: Secondary | ICD-10-CM | POA: Diagnosis present

## 2016-10-13 DIAGNOSIS — R0602 Shortness of breath: Secondary | ICD-10-CM | POA: Diagnosis not present

## 2016-10-13 DIAGNOSIS — E1151 Type 2 diabetes mellitus with diabetic peripheral angiopathy without gangrene: Secondary | ICD-10-CM | POA: Diagnosis not present

## 2016-10-13 DIAGNOSIS — G4733 Obstructive sleep apnea (adult) (pediatric): Secondary | ICD-10-CM | POA: Diagnosis present

## 2016-10-13 DIAGNOSIS — Z6833 Body mass index (BMI) 33.0-33.9, adult: Secondary | ICD-10-CM

## 2016-10-13 DIAGNOSIS — R918 Other nonspecific abnormal finding of lung field: Secondary | ICD-10-CM | POA: Diagnosis present

## 2016-10-13 DIAGNOSIS — I2119 ST elevation (STEMI) myocardial infarction involving other coronary artery of inferior wall: Secondary | ICD-10-CM | POA: Diagnosis present

## 2016-10-13 DIAGNOSIS — I480 Paroxysmal atrial fibrillation: Secondary | ICD-10-CM | POA: Diagnosis present

## 2016-10-13 DIAGNOSIS — H538 Other visual disturbances: Secondary | ICD-10-CM | POA: Diagnosis present

## 2016-10-13 DIAGNOSIS — K921 Melena: Principal | ICD-10-CM | POA: Diagnosis present

## 2016-10-13 DIAGNOSIS — D649 Anemia, unspecified: Secondary | ICD-10-CM | POA: Diagnosis not present

## 2016-10-13 DIAGNOSIS — R911 Solitary pulmonary nodule: Secondary | ICD-10-CM | POA: Diagnosis not present

## 2016-10-13 DIAGNOSIS — K7460 Unspecified cirrhosis of liver: Secondary | ICD-10-CM | POA: Diagnosis not present

## 2016-10-13 DIAGNOSIS — Z9884 Bariatric surgery status: Secondary | ICD-10-CM

## 2016-10-13 DIAGNOSIS — Z79899 Other long term (current) drug therapy: Secondary | ICD-10-CM

## 2016-10-13 DIAGNOSIS — D62 Acute posthemorrhagic anemia: Secondary | ICD-10-CM | POA: Diagnosis present

## 2016-10-13 DIAGNOSIS — K317 Polyp of stomach and duodenum: Secondary | ICD-10-CM | POA: Diagnosis present

## 2016-10-13 DIAGNOSIS — M797 Fibromyalgia: Secondary | ICD-10-CM | POA: Diagnosis present

## 2016-10-13 DIAGNOSIS — K922 Gastrointestinal hemorrhage, unspecified: Secondary | ICD-10-CM | POA: Diagnosis present

## 2016-10-13 DIAGNOSIS — Z9851 Tubal ligation status: Secondary | ICD-10-CM

## 2016-10-13 DIAGNOSIS — Z951 Presence of aortocoronary bypass graft: Secondary | ICD-10-CM

## 2016-10-13 DIAGNOSIS — M47816 Spondylosis without myelopathy or radiculopathy, lumbar region: Secondary | ICD-10-CM | POA: Diagnosis not present

## 2016-10-13 DIAGNOSIS — I739 Peripheral vascular disease, unspecified: Secondary | ICD-10-CM | POA: Diagnosis present

## 2016-10-13 DIAGNOSIS — Z87891 Personal history of nicotine dependence: Secondary | ICD-10-CM

## 2016-10-13 DIAGNOSIS — I69398 Other sequelae of cerebral infarction: Secondary | ICD-10-CM

## 2016-10-13 DIAGNOSIS — K219 Gastro-esophageal reflux disease without esophagitis: Secondary | ICD-10-CM | POA: Diagnosis present

## 2016-10-13 DIAGNOSIS — D500 Iron deficiency anemia secondary to blood loss (chronic): Secondary | ICD-10-CM | POA: Diagnosis present

## 2016-10-13 DIAGNOSIS — Z885 Allergy status to narcotic agent status: Secondary | ICD-10-CM

## 2016-10-13 DIAGNOSIS — Z955 Presence of coronary angioplasty implant and graft: Secondary | ICD-10-CM

## 2016-10-13 DIAGNOSIS — I152 Hypertension secondary to endocrine disorders: Secondary | ICD-10-CM | POA: Diagnosis present

## 2016-10-13 DIAGNOSIS — Z7951 Long term (current) use of inhaled steroids: Secondary | ICD-10-CM

## 2016-10-13 DIAGNOSIS — N186 End stage renal disease: Secondary | ICD-10-CM | POA: Diagnosis not present

## 2016-10-13 DIAGNOSIS — Z888 Allergy status to other drugs, medicaments and biological substances status: Secondary | ICD-10-CM

## 2016-10-13 DIAGNOSIS — Z882 Allergy status to sulfonamides status: Secondary | ICD-10-CM

## 2016-10-13 DIAGNOSIS — Z7984 Long term (current) use of oral hypoglycemic drugs: Secondary | ICD-10-CM

## 2016-10-13 DIAGNOSIS — Z886 Allergy status to analgesic agent status: Secondary | ICD-10-CM

## 2016-10-13 DIAGNOSIS — Z9861 Coronary angioplasty status: Secondary | ICD-10-CM

## 2016-10-13 HISTORY — DX: Family history of other specified conditions: Z84.89

## 2016-10-13 LAB — COMPREHENSIVE METABOLIC PANEL
ALK PHOS: 112 U/L (ref 38–126)
ALT: 10 U/L — ABNORMAL LOW (ref 14–54)
AST: 22 U/L (ref 15–41)
Albumin: 3 g/dL — ABNORMAL LOW (ref 3.5–5.0)
Anion gap: 13 (ref 5–15)
BILIRUBIN TOTAL: 0.1 mg/dL — AB (ref 0.3–1.2)
BUN: 11 mg/dL (ref 6–20)
CALCIUM: 9.4 mg/dL (ref 8.9–10.3)
CO2: 24 mmol/L (ref 22–32)
Chloride: 101 mmol/L (ref 101–111)
Creatinine, Ser: 0.87 mg/dL (ref 0.44–1.00)
GFR calc Af Amer: >60 mL/min (ref >60)
GFR calc non Af Amer: >60 mL/min (ref >60)
Glucose, Bld: 121 mg/dL — ABNORMAL HIGH (ref 65–99)
POTASSIUM: 3.5 mmol/L (ref 3.5–5.1)
Sodium: 138 mmol/L (ref 135–145)
Total Protein: 6.6 g/dL (ref 6.5–8.1)

## 2016-10-13 LAB — CBC
HEMATOCRIT: 25.3 % — AB (ref 36.0–46.0)
Hemoglobin: 7.6 g/dL — ABNORMAL LOW (ref 12.0–15.0)
MCH: 25.8 pg — ABNORMAL LOW (ref 26.0–34.0)
MCHC: 30 g/dL (ref 30.0–36.0)
MCV: 85.8 fL (ref 78.0–100.0)
PLATELETS: 359 10*3/uL (ref 150–400)
RBC: 2.95 MIL/uL — ABNORMAL LOW (ref 3.87–5.11)
RDW: 17 % — AB (ref 11.5–15.5)
WBC: 6.9 10*3/uL (ref 4.0–10.5)

## 2016-10-13 LAB — URINALYSIS, ROUTINE W REFLEX MICROSCOPIC
Bilirubin Urine: NEGATIVE
GLUCOSE, UA: NEGATIVE mg/dL
Hgb urine dipstick: NEGATIVE
Ketones, ur: 5 mg/dL — AB
LEUKOCYTES UA: NEGATIVE
Nitrite: NEGATIVE
PH: 5 (ref 5.0–8.0)
Protein, ur: NEGATIVE mg/dL
Specific Gravity, Urine: 1.018 (ref 1.005–1.030)

## 2016-10-13 LAB — POC OCCULT BLOOD, ED: Fecal Occult Bld: NEGATIVE

## 2016-10-13 LAB — I-STAT TROPONIN, ED: Troponin i, poc: 0.02 ng/mL (ref 0.00–0.08)

## 2016-10-13 LAB — LIPASE, BLOOD: Lipase: 19 U/L (ref 11–51)

## 2016-10-13 MED ORDER — OXYCODONE-ACETAMINOPHEN 5-325 MG PO TABS
1.0000 | ORAL_TABLET | Freq: Once | ORAL | Status: AC
  Administered 2016-10-13: 1 via ORAL
  Filled 2016-10-13: qty 1

## 2016-10-13 NOTE — ED Provider Notes (Signed)
[facility] DEPT Provider Note   CSN: [id number] Arrival date & time:        History   Chief Complaint Chief Complaint  Patient presents with  . Palpitations  . GI Bleeding    HPI Katherine [name] is a 61 y.o. female. Patient has complicated medical history as below who presents with multiple complaints. She reports that she has felt generally weak for the last 3 weeks. No focal weakness. She endorses occasional diarrhea that has been intermittently black and tarry. She is having lower abdominal pain. She has had a cough, ear pain, generalized malaise and body aches for the last 3 weeks as well. She denies any frank chest pain, but she does endorse feeling of palpitations earlier today. No dysuria. No nausea, vomiting, or constipation. She denies any shortness of breath. She does have a history of a kidney transplant and is compliant with her immunosuppressants.  HPI  Past Medical History:  Diagnosis Date  . Anxiety   . Bilateral carotid artery stenosis    Carotid duplex 04/4708: 6-28% LICA, 36-62% RICA, >94% RECA, f/u 1 yr suggested  . CAD (coronary artery disease) of bypass graft 5/01; 3/'02, 8/'03, 10/'04; 1/15   PCI x 5 to SVG-D1   . CAD in native artery 07/1993    3 Vessel Disease (LAD-D1 & RCA) -- CABG  . CAD S/P percutaneous coronary angioplasty    PCI to SVG-D1 insertion/native D1 x 4 = '01 -(S660 BMS 2.5 x 9 - insertion into D1; '02 - distal overlap ACS Pixel 2.5 x 8  BMS; '03 distal/native ISR/Thrombosis - Pixel 2.5 x 13; '04 - ISR-  Taxus 2.5 x 20 (covered all);; 1/15 - mid SVG-D1 (50% distal ISR) - Promus P 2.75 x 20 -- 2.8 mm  . COPD mixed type ([place])    Followed by Dr. Lamonte Sakai "pulmonologist said no COPD"  . Depression   . Depression with anxiety   . Diabetes mellitus type 2 in obese ([place])   . Dyslipidemia, goal LDL below 70    08/2012: TC 137, TG 200, HDL 32!, LDL 45; on statin (followed by Dr.Deterding)  . Enlarged heart    "told one time"  . ESRD (end stage  renal disease) ([place]) 1991   s/p Cadaveric Renal Transplant [facility] - Dr. Jimmy Footman)   . Fibromyalgia   . GERD (gastroesophageal reflux disease)   . Glomerulonephritis, chronic, rapidly progressive 65  . 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 07/1993   Rescue PTCA of RCA -- referred for CABG.  . H/O: GI bleed   . Hypertension associated with diabetes ([place])   . MRSA (methicillin resistant staph aureus) culture positive   . Obesity    s/p Lap Band 04/2004- Port Replacement 10/09 & 2/10 for infection; Dr. Excell Seltzer.  . OSA on CPAP    oxygen _0  @ 2L  . PAD (peripheral artery disease) ([place]) 08/2013   LEA Dopplers to be read by Dr. Fletcher Anon  . PAF (paroxysmal atrial fibrillation) ([place])    Noted on CardioNet Monitor 11/5-18 - rate ~112  . Recurrent boils    Bilateral Groin  . Rheumatoid arthritis ([place])    Per Patient Report; associated with OA  . 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 Servando Snare: LIMA-LAD, SVG-bifurcatingD1, SVG-rPDA  . S/p cadaver renal transplant [facility]  . Stroke [facility]) 2012   "right eye stroke- half blind now"  . Unstable angina ([place]) 5/01; 3/'02, 8/'03, 10/'04; 1/15   x  5 occurences since Inf-Post STEMI in 1994    Patient Active Problem List   Diagnosis Date Noted  . Acute GI bleeding 10/14/2016  . Lung nodules 10/14/2016  . Anemia 10/14/2016  . Symptomatic anemia 10/13/2016  . Right rotator cuff tendinitis 05/19/2016  . Hip pain, bilateral 03/30/2016  . Bilateral knee pain 03/30/2016  . Fibromyalgia 03/30/2016  . Spondylosis of lumbar region without myelopathy or radiculopathy 03/30/2016  . ESRD (end stage renal disease) ([place]) 03/30/2016  . Type 2 diabetes mellitus with peripheral neuropathy ([place]) 03/30/2016  . Bright red rectal bleeding 08/03/2014  . PAF (paroxysmal atrial fibrillation) ([place]) 06/28/2014  . Weakness 06/28/2014  . Palpitations 06/21/2014  . Frequent PVCs 06/21/2014  . Fatigue 06/21/2014  . CAD in native artery  09/11/2013  . CAD S/P percutaneous coronary angioplasty - PCI x 5 to SVG-D1 09/11/2013    Class: Diagnosis of  . Hypertension associated with diabetes ([place])   . Chest pain 08/23/2013  . PAD (peripheral artery disease) ([place]) 08/17/2013  . Atherosclerosis of coronary artery bypass graft without angina pectoris 10/08/2012  . Stenosis of right carotid artery without infarction 10/08/2012  . Dyslipidemia, goal LDL below 70 10/08/2012    Class: Diagnosis of  . Mitral annular calcification 10/08/2012  . Leukocytosis 06/13/2012  . Hypomagnesemia 06/13/2012  . Hypokalemia 06/12/2012  . Fever 06/12/2012  . Cellulitis 06/12/2012  . Renal transplant disorder 06/12/2012  . Chronic allergic rhinitis 04/29/2011  . OTITIS EXTERNA, ACUTE 09/12/2007  . Sleep apnea 09/09/2007  . Extrinsic asthma 09/09/2007  . SINUSITIS, CHRONIC 09/09/2007  . GERD 09/09/2007  . COUGH, CHRONIC 09/09/2007  . Morbid obesity - s/p Lap Band 9/'05 05/07/2004    Class: Diagnosis of  . S/P CABG (coronary artery bypass graft) x 3 09/07/1993    Class: History of  . ST elevation myocardial infarction (STEMI) of inferoposterior wall; H/o -- Rescue PTCA of RCA -- referred for CABG 08/07/1993    Class: History of    Past Surgical History:  Procedure Laterality Date  . CARDIAC CATHETERIZATION  5/'01, 3/'02, 8/'03, 10/'04; 1/'15   08/22/2013: LAD & RCA 100%; LIMA-LAD & SVG-rPDA patent; Cx-- OM1 60%, OM2 ostial ~50%; SVG-D1 - 80% mid, 50% distal ISR --PCI  . CARDIAC CATHETERIZATION  5/'01, 3/'02, 8/'03, 10/'04; 1/'15   08/22/2013: LAD & RCA 100%; LIMA-LAD & SVG-rPDA patent; Cx-- OM1 60%, OM2 ostial ~50%; SVG-D1 - 80% mid, 50% distal ISR --PCI  . CATHETER REMOVAL    . CHOLECYSTECTOMY N/A 10/29/2014   Procedure: LAPAROSCOPIC CHOLECYSTECTOMY WITH INTRAOPERATIVE CHOLANGIOGRAM;  Surgeon: Excell Seltzer, MD;  Location: [facility] ORS;  Service: General;  Laterality: N/A;  . CORONARY ANGIOPLASTY  1994   x5  . CORONARY ARTERY BYPASS GRAFT  1995    LIMA-LAD, SVG-RPDA, SVG-D1  . INCISE AND DRAIN ABCESS    . KIDNEY TRANSPLANT  1991  . LAPAROSCOPIC GASTRIC BANDING  04/2004; 10/'09, 2/'10   Port Replacement x 2  . LEFT HEART CATHETERIZATION WITH CORONARY/GRAFT ANGIOGRAM N/A 08/23/2013   Procedure: LEFT HEART CATHETERIZATION WITH Beatrix Fetters;  Surgeon: Wellington Hampshire, MD;  Location: [facility] CATH LAB;  Service: Cardiovascular;  Laterality: N/A;  . Lower Extremity Arterial Dopplers  08/2013   ABI: R 0.96, L 1.04  . MULTIPLE TOOTH EXTRACTIONS  age 47  . PERCUTANEOUS CORONARY STENT INTERVENTION (PCI-S)  5/'01, 3/'02, 8/'03, 10/'04;   '01 - S660 BMS 2.5 x 9 - dSVG-D1 into D1; '02- post-stent stenosis - 2.5 x 8 Pixel BMS; '8\03: ISR/Thrombosis into  native D1 - AngioJet, 2.5 x 13 Pixel; '04 - ISR 95% - covered stented area with Taxus DES 2.5 mm x 20 (2.88)  . PERCUTANEOUS CORONARY STENT INTERVENTION (PCI-S)  08/22/2013   mid SVG-D1 80%; distal stent ~50% ISR; Promus Prermier DES 2.75 mm xc 20 mm (2.8 mm)  . PERCUTANEOUS CORONARY STENT INTERVENTION (PCI-S)  08/23/2013   Procedure: PERCUTANEOUS CORONARY STENT INTERVENTION (PCI-S);  Surgeon: Wellington Hampshire, MD;  Location: [facility] CATH LAB;  Service: Cardiovascular;;  . PORT-A-CATH REMOVAL     kidney  . TRANSTHORACIC ECHOCARDIOGRAM  02/2013   NL LV Size, EF 55-60%; MAC with Mild MR.  . TUBAL LIGATION    . wrist fistula repair Left    dialysis for one year    OB History    No data available       Home Medications    Prior to Admission medications   Medication Sig Start Date End Date Taking? Authorizing Provider  azaTHIOprine (IMURAN) 50 MG tablet Take 125 mg by mouth every evening.    Yes Historical Provider, MD  budesonide-formoterol (SYMBICORT) 160-4.5 MCG/ACT inhaler Inhale 2 puffs into the lungs 2 (two) times daily. 06/16/16 07/17/17 Yes Collene Gobble, MD  calcitRIOL (ROCALTROL) 0.25 MCG capsule Take 0.25 mcg by mouth every morning.    Yes Historical Provider, MD  carbamazepine  (TEGRETOL) 200 MG tablet Take 200 mg three x daily. Patient taking differently: Take 200 mg by mouth 2 (two) times daily.  05/19/16  Yes Meredith Staggers, MD  clopidogrel (PLAVIX) 75 MG tablet TAKE 1 TABLET (75 MG TOTAL) BY MOUTH DAILY. 02/11/16  Yes [name], MD  cyclobenzaprine (FLEXERIL) 10 MG tablet Take 10 mg by mouth 2 (two) times daily.    Yes Historical Provider, MD  diltiazem (CARDIZEM CD) 120 MG 24 hr capsule Take 1 capsule (120 mg total) by mouth daily. NEED OV. 08/13/16  Yes [name], MD  diphenhydramine-acetaminophen (TYLENOL PM) 25-500 MG TABS tablet Take 2 tablets by mouth at bedtime as needed (for sleep).   Yes Historical Provider, MD  DULoxetine (CYMBALTA) 30 MG capsule TAKE 2 CAPSULES (60 MG TOTAL) BY MOUTH EVERY MORNING. 09/29/16  Yes Meredith Staggers, MD  fluticasone [facility]) 50 MCG/ACT nasal spray PLACE 2 SPRAYS INTO THE NOSE DAILY 08/28/16  Yes Collene Gobble, MD  glimepiride (AMARYL) 4 MG tablet Take 4 mg by mouth every morning. 04/20/16  Yes Historical Provider, MD  LORazepam (ATIVAN) 1 MG tablet Take 1 mg by mouth every morning.    Yes Historical Provider, MD  metFORMIN (GLUCOPHAGE) 500 MG tablet Take 500 mg by mouth 2 (two) times daily. 10/03/16  Yes Historical Provider, MD  metoprolol tartrate (LOPRESSOR) 25 MG tablet Take half tablet (12.5 mg total) by mouth daily. Take whole tablet (25 mg total) by mouth daily if SBP > 150. Patient taking differently: Take 12.5-25 mg by mouth daily as needed (for BP). Take half tablet (12.5 mg total) by mouth daily. Take whole tablet (25 mg total) by mouth daily if SBP > 150. 02/20/16  Yes Eileen Stanford, PA-C  montelukast (SINGULAIR) 10 MG tablet TAKE 1 TABLET (10 MG TOTAL) BY MOUTH DAILY. 07/06/16  Yes Collene Gobble, MD  omeprazole (PRILOSEC) 40 MG capsule Take 40 mg by mouth every morning.    Yes Historical Provider, MD  oxyCODONE (OXY IR/ROXICODONE) 5 MG immediate release tablet Take 1 tablet (5 mg total) by mouth every 8  (eight) hours as needed for severe  pain. 05/19/16  Yes Meredith Staggers, MD  predniSONE (DELTASONE) 5 MG tablet Take 5 mg by mouth every morning.    Yes Historical Provider, MD  promethazine (PHENERGAN) 25 MG tablet Take 25 mg by mouth at bedtime as needed for nausea or vomiting. For nausea and sleep   Yes Historical Provider, MD  rosuvastatin (CRESTOR) 20 MG tablet TAKE 1 TABLET (20 MG TOTAL) BY MOUTH DAILY. 06/29/16  Yes [name], MD  sertraline (ZOLOFT) 100 MG tablet Take 100 mg by mouth every morning.   Yes Historical Provider, MD  VENTOLIN HFA 108 (90 Base) MCG/ACT inhaler INHALE 2 PUFFS INTO THE LUNGS EVERY 6 (SIX) HOURS AS NEEDED FOR WHEEZING OR SHORTNESS OF BREATH. 10/13/16  Yes Collene Gobble, MD  acetaminophen (TYLENOL) 500 MG tablet Take 1,500 mg by mouth every 6 (six) hours as needed for moderate pain or headache.    Historical Provider, MD  nitroGLYCERIN (NITROSTAT) 0.4 MG SL tablet Place 1 tablet (0.4 mg total) under the tongue every 5 (five) minutes as needed for chest pain. 09/11/13   [name], MD    Family History Family History  Problem Relation Age of Onset  . Cancer Mother     liver  . Heart disease Father   . Cancer Father     colon  . Arrhythmia Brother     Atrial Fibrillation  . Arrhythmia Paternal Aunt     Atrial Fibrillation    Social History Social History  Substance Use Topics  . Smoking status: Former Smoker    Packs/day: 1.00    Years: 30.00    Types: Cigarettes    Quit date: 08/17/2002  . Smokeless tobacco: Never Used  . Alcohol use No     Allergies   Codeine; Erythromycin; Hydromorphone hcl; Morphine and related; Nalbuphine; Niaspan [niacin er]; Sulfonamide derivatives; and Tetracycline   Review of Systems Review of Systems  Constitutional: Positive for fatigue. Negative for chills and fever.  HENT: Positive for ear pain. Negative for sore throat.   Eyes: Negative for pain and visual disturbance.  Respiratory: Positive for cough.  Negative for shortness of breath.   Cardiovascular: Positive for palpitations. Negative for chest pain.  Gastrointestinal: Positive for abdominal pain, blood in stool and diarrhea. Negative for constipation, nausea and vomiting.  Genitourinary: Negative for dysuria and hematuria.  Musculoskeletal: Negative for arthralgias and back pain.  Skin: Negative for color change and rash.  Neurological: Positive for weakness. Negative for seizures and syncope.  All other systems reviewed and are negative.    Physical Exam Updated Vital Signs BP 90/71   Pulse (!) 103   Temp 97.9 F (36.6 C) (Oral)   Resp 18   Ht _0  (1.549 m)   Wt 81.4 kg   SpO2 100%   BMI 33.92 kg/m   Physical Exam  Constitutional: She appears well-developed and well-nourished. No distress.  HENT:  Head: Normocephalic and atraumatic.  Eyes: Conjunctivae are normal.  Neck: Neck supple.  Cardiovascular: Normal rate, regular rhythm and intact distal pulses.   No murmur heard. Pulmonary/Chest: Effort normal and breath sounds normal. No respiratory distress.  Abdominal: Soft. She exhibits no distension and no mass. There is tenderness (mild suprapubic tenderness). There is no rebound and no guarding.  Musculoskeletal: She exhibits no edema, tenderness or deformity.  Neurological: She is alert.  Skin: Skin is warm and dry.  Psychiatric: She has a normal mood and affect.  Nursing note and vitals reviewed.  ED Treatments / Results  Labs (all labs ordered are listed, but only abnormal results are displayed) Labs Reviewed  COMPREHENSIVE METABOLIC PANEL - Abnormal; Notable for the following:       Result Value   Glucose, Bld 121 (*)    Albumin 3.0 (*)    ALT 10 (*)    Total Bilirubin 0.1 (*)    All other components within normal limits  CBC - Abnormal; Notable for the following:    RBC 2.95 (*)    Hemoglobin 7.6 (*)    HCT 25.3 (*)    MCH 25.8 (*)    RDW 17.0 (*)    All other components within normal limits    URINALYSIS, ROUTINE W REFLEX MICROSCOPIC - Abnormal; Notable for the following:    Color, Urine AMBER (*)    Ketones, ur 5 (*)    All other components within normal limits  CBC - Abnormal; Notable for the following:    RBC 2.92 (*)    Hemoglobin 7.6 (*)    HCT 25.2 (*)    RDW 16.9 (*)    All other components within normal limits  CBC - Abnormal; Notable for the following:    RBC 2.95 (*)    Hemoglobin 7.6 (*)    HCT 25.4 (*)    MCH 25.8 (*)    MCHC 29.9 (*)    RDW 17.1 (*)    All other components within normal limits  CBC - Abnormal; Notable for the following:    RBC 2.90 (*)    Hemoglobin 7.5 (*)    HCT 25.0 (*)    MCH 25.9 (*)    RDW 16.8 (*)    All other components within normal limits  IRON AND TIBC - Abnormal; Notable for the following:    Iron 24 (*)    Saturation Ratios 8 (*)    All other components within normal limits  FERRITIN - Abnormal; Notable for the following:    Ferritin 9 (*)    All other components within normal limits  RETICULOCYTES - Abnormal; Notable for the following:    RBC. 2.91 (*)    All other components within normal limits  BASIC METABOLIC PANEL - Abnormal; Notable for the following:    Glucose, Bld 105 (*)    All other components within normal limits  MRSA PCR SCREENING  CULTURE, BLOOD (ROUTINE X 2)  CULTURE, BLOOD (ROUTINE X 2)  CULTURE, EXPECTORATED SPUTUM-ASSESSMENT  LIPASE, BLOOD  VITAMIN B12  FOLATE  GLUCOSE, CAPILLARY  STREP PNEUMONIAE URINARY ANTIGEN  GLUCOSE, CAPILLARY  GLUCOSE, CAPILLARY  PROTIME-INR  GLUCOSE, CAPILLARY  HIV ANTIBODY (ROUTINE TESTING)  LEGIONELLA PNEUMOPHILA SEROGP 1 UR AG  HEPATITIS PANEL, ACUTE  POC OCCULT BLOOD, ED  I-STAT TROPOININ, ED  TYPE AND SCREEN  PREPARE RBC (CROSSMATCH)    EKG  EKG Interpretation  Date/Time:  Tuesday October 13 2016 19:20:12 EST Ventricular Rate:  79 PR Interval:    QRS Duration: 87 QT Interval:  530 QTC Calculation: 608 R Axis:   5 Text Interpretation:  Sinus  rhythm Abnormal R-wave progression, early transition Borderline T abnormalities, anterior leads Prolonged QT interval No significant change since last tracing Confirmed by LITTLE MD, RACHEL [id number]) on 10/13/2016 8:33:39 PM       Radiology Dg Chest 2 View  Result Date: 10/13/2016 CLINICAL DATA:  Palpitations and shortness of breath. EXAM: CHEST  2 VIEW COMPARISON:  08/31/2016 FINDINGS: Post median sternotomy and CABG. Unchanged heart size and mediastinal contours, heart upper  limits of normal. Probable hiatal hernia. Development of multifocal ill-defined opacities within both lungs, right greater than left, some of which appear nodular. Streaky scarring in the left mid lung. Gastric band is faintly visualized. No acute osseous abnormalities are seen. IMPRESSION: Multifocal opacities in both lungs that appear nodule or, right greater than left. These appear new from exam 6 weeks prior. This may be infectious or inflammatory, however CT characterization may be helpful. Electronically Signed   By: Jeb Levering M.D.   On: 10/13/2016 20:26   Ct Chest Wo Contrast  Result Date: 10/14/2016 CLINICAL DATA:  Assess pulmonary nodules noted on chest radiograph. Initial encounter. EXAM: CT CHEST WITHOUT CONTRAST TECHNIQUE: Multidetector CT imaging of the chest was performed following the standard protocol without IV contrast. COMPARISON:  Chest radiograph from 10/13/2016 FINDINGS: Cardiovascular: The patient is status post median sternotomy. Diffuse coronary artery calcifications are seen. The heart remains normal in size. Scattered calcification is noted along the aortic arch and descending thoracic aorta. Mild calcification is seen at the proximal great vessels. Mediastinum/Nodes: The esophagus is filled with fluid, raising question for esophageal dysmotility or gastroesophageal reflux. Scattered calcifications and heterogeneity are noted at the thyroid gland. No dominant mass is seen. No mediastinal  lymphadenopathy is seen. No pericardial effusion is identified. No axillary lymphadenopathy is identified. Lungs/Pleura: Multiple scattered nodules and associated tiny opacities are noted throughout both lungs, more prominent on the right side. Given that these are new from January, these are most likely infectious in nature. No pleural effusion or pneumothorax is seen. No dominant mass is identified. Upper Abdomen: The mildly nodular contour of the liver raises question for mild hepatic cirrhosis. Peripheral calcification is seen at the spleen, possibly reflecting remote injury. The patient is status post cholecystectomy, with clips noted at the gallbladder fossa. The visualized portions of the pancreas and adrenal glands are within normal limits. There is chronic severe bilateral renal atrophy. A gastric band is noted. Musculoskeletal: No acute osseous abnormalities are identified. Mild multilevel vacuum phenomenon is noted along the thoracic spine. The visualized musculature is unremarkable in appearance. IMPRESSION: 1. Scattered nodules and associated tiny opacities within both lungs, more prominent on the right. Given that these are new from January, they are most likely infectious in nature. Would suggest Pulmonary consultation for further evaluation, to assess the underlying infectious etiology. 2. Esophagus filled with fluid, raising question for esophageal dysmotility or gastroesophageal reflux. 3. Diffuse coronary artery calcifications seen. 4. Suggestion of mild hepatic cirrhosis. 5. Chronic severe bilateral renal atrophy. Electronically Signed   By: Garald Balding M.D.   On: 10/14/2016 02:58    Procedures Procedures (including critical care time)  Medications Ordered in ED Medications  albuterol (PROVENTIL) (2.5 MG/3ML) 0.083% nebulizer solution 3 mL (not administered)  DULoxetine (CYMBALTA) DR capsule 60 mg (60 mg Oral Given 10/14/16 1040)  fluticasone (FLONASE) 50 MCG/ACT nasal spray 2 spray  (2 sprays Each Nare Given 10/14/16 1039)  diltiazem (CARDIZEM CD) 24 hr capsule 120 mg (120 mg Oral Not Given 10/14/16 1040)  montelukast (SINGULAIR) tablet 10 mg (10 mg Oral Given 10/14/16 0137)  rosuvastatin (CRESTOR) tablet 20 mg (not administered)  mometasone-formoterol (DULERA) 200-5 MCG/ACT inhaler 2 puff (2 puffs Inhalation Not Given 10/14/16 1042)  carbamazepine (TEGRETOL) tablet 200 mg (200 mg Oral Given 10/14/16 1040)  oxyCODONE (Oxy IR/ROXICODONE) immediate release tablet 5 mg (5 mg Oral Given 10/14/16 1231)  sertraline (ZOLOFT) tablet 100 mg (100 mg Oral Given 10/14/16 1040)  nitroGLYCERIN (NITROSTAT) SL tablet 0.4  mg (not administered)  azaTHIOprine (IMURAN) tablet 125 mg (not administered)  calcitRIOL (ROCALTROL) capsule 0.25 mcg (0.25 mcg Oral Given 10/14/16 1040)  promethazine (PHENERGAN) tablet 25 mg (25 mg Oral Given 10/14/16 0137)  cyclobenzaprine (FLEXERIL) tablet 10 mg (10 mg Oral Not Given 10/14/16 1039)  LORazepam (ATIVAN) tablet 1 mg (1 mg Oral Given 10/14/16 1040)  pantoprazole (PROTONIX) 80 mg in sodium chloride 0.9 % 250 mL (0.32 mg/mL) infusion (8 mg/hr Intravenous New Bag/Given 10/14/16 1247)  pantoprazole (PROTONIX) injection 40 mg (not administered)  acetaminophen (TYLENOL) tablet 650 mg (not administered)    Or  acetaminophen (TYLENOL) suppository 650 mg (not administered)  ondansetron (ZOFRAN) tablet 4 mg (not administered)    Or  ondansetron (ZOFRAN) injection 4 mg (not administered)  insulin aspart (novoLOG) injection 0-9 Units (0 Units Subcutaneous Not Given 10/14/16 1200)  0.9 %  sodium chloride infusion (not administered)  levofloxacin (LEVAQUIN) IVPB 500 mg (not administered)  oxyCODONE-acetaminophen (PERCOCET/ROXICET) 5-325 MG per tablet 1 tablet (1 tablet Oral Given 10/13/16 2210)  pantoprazole (PROTONIX) 80 mg in sodium chloride 0.9 % 100 mL IVPB (80 mg Intravenous Given 10/14/16 0138)     Initial Impression / Assessment and Plan / ED Course  I have reviewed  the triage vital signs and the nursing notes.  Pertinent labs & imaging results that were available during my care of the patient were reviewed by me and considered in my medical decision making (see chart for details).    Patient is a 61 year old female with history as above who presents with generalized malaise, fatigue, cough, and concern for black tarry stool. She reports that she has been having intermittent black tarry stool for several weeks. She has outpatient GI follow-up scheduled. Lab workup today however revealed that her hemoglobin has dropped from 11 in September to 7.6 today. Of note, her Hemoccult was negative. Her chest x-ray showed multiple lung nodules that are new since her last chest x-ray in January. She denies any hemoptysis. Given her white blood cell count, I'm not certain that these are infectious, but that still maintains a primary concern in the differential. Due to her 4 point drop in hemoglobin and these lung findings, she will be admitted to hospitalist for further workup and treatment. She has remained hemodynamically stable in the ED. I will defer antibiotics to the inpatient team.  Final Clinical Impressions(s) / ED Diagnoses   Final diagnoses:  Lung nodules  Cirrhosis of liver [facility])    New Prescriptions Current Discharge Medication List       Clifton James, MD 10/14/16 [address], MD 10/19/16 2121

## 2016-10-13 NOTE — ED Triage Notes (Signed)
Pt c/o of abd pain. GI bleed. Black tarry stools. Lower quad abdominal pain. Ortho changes. Feeling weak. 4/10 pain. Felt like her heart was racing before 911 was called. Extensive cardiac history.

## 2016-10-14 ENCOUNTER — Encounter (HOSPITAL_COMMUNITY): Payer: Self-pay | Admitting: Internal Medicine

## 2016-10-14 ENCOUNTER — Observation Stay (HOSPITAL_COMMUNITY): Payer: Medicare Other

## 2016-10-14 DIAGNOSIS — R918 Other nonspecific abnormal finding of lung field: Secondary | ICD-10-CM | POA: Diagnosis not present

## 2016-10-14 DIAGNOSIS — Z87891 Personal history of nicotine dependence: Secondary | ICD-10-CM | POA: Diagnosis not present

## 2016-10-14 DIAGNOSIS — G4733 Obstructive sleep apnea (adult) (pediatric): Secondary | ICD-10-CM | POA: Diagnosis present

## 2016-10-14 DIAGNOSIS — D649 Anemia, unspecified: Secondary | ICD-10-CM | POA: Diagnosis not present

## 2016-10-14 DIAGNOSIS — Z885 Allergy status to narcotic agent status: Secondary | ICD-10-CM | POA: Diagnosis not present

## 2016-10-14 DIAGNOSIS — I252 Old myocardial infarction: Secondary | ICD-10-CM | POA: Diagnosis not present

## 2016-10-14 DIAGNOSIS — Z9861 Coronary angioplasty status: Secondary | ICD-10-CM | POA: Diagnosis not present

## 2016-10-14 DIAGNOSIS — E1159 Type 2 diabetes mellitus with other circulatory complications: Secondary | ICD-10-CM | POA: Diagnosis not present

## 2016-10-14 DIAGNOSIS — Z9884 Bariatric surgery status: Secondary | ICD-10-CM | POA: Diagnosis not present

## 2016-10-14 DIAGNOSIS — D62 Acute posthemorrhagic anemia: Secondary | ICD-10-CM | POA: Diagnosis present

## 2016-10-14 DIAGNOSIS — I2510 Atherosclerotic heart disease of native coronary artery without angina pectoris: Secondary | ICD-10-CM | POA: Diagnosis not present

## 2016-10-14 DIAGNOSIS — E1151 Type 2 diabetes mellitus with diabetic peripheral angiopathy without gangrene: Secondary | ICD-10-CM | POA: Diagnosis present

## 2016-10-14 DIAGNOSIS — K2900 Acute gastritis without bleeding: Secondary | ICD-10-CM | POA: Diagnosis not present

## 2016-10-14 DIAGNOSIS — Z886 Allergy status to analgesic agent status: Secondary | ICD-10-CM | POA: Diagnosis not present

## 2016-10-14 DIAGNOSIS — Z882 Allergy status to sulfonamides status: Secondary | ICD-10-CM | POA: Diagnosis not present

## 2016-10-14 DIAGNOSIS — K219 Gastro-esophageal reflux disease without esophagitis: Secondary | ICD-10-CM | POA: Diagnosis not present

## 2016-10-14 DIAGNOSIS — E1169 Type 2 diabetes mellitus with other specified complication: Secondary | ICD-10-CM | POA: Diagnosis present

## 2016-10-14 DIAGNOSIS — D132 Benign neoplasm of duodenum: Secondary | ICD-10-CM | POA: Diagnosis not present

## 2016-10-14 DIAGNOSIS — D539 Nutritional anemia, unspecified: Secondary | ICD-10-CM | POA: Diagnosis present

## 2016-10-14 DIAGNOSIS — Z7952 Long term (current) use of systemic steroids: Secondary | ICD-10-CM | POA: Diagnosis not present

## 2016-10-14 DIAGNOSIS — Z951 Presence of aortocoronary bypass graft: Secondary | ICD-10-CM | POA: Diagnosis not present

## 2016-10-14 DIAGNOSIS — M797 Fibromyalgia: Secondary | ICD-10-CM | POA: Diagnosis present

## 2016-10-14 DIAGNOSIS — I10 Essential (primary) hypertension: Secondary | ICD-10-CM | POA: Diagnosis not present

## 2016-10-14 DIAGNOSIS — Z9851 Tubal ligation status: Secondary | ICD-10-CM | POA: Diagnosis not present

## 2016-10-14 DIAGNOSIS — K3189 Other diseases of stomach and duodenum: Secondary | ICD-10-CM | POA: Diagnosis not present

## 2016-10-14 DIAGNOSIS — Z79899 Other long term (current) drug therapy: Secondary | ICD-10-CM | POA: Diagnosis not present

## 2016-10-14 DIAGNOSIS — G4730 Sleep apnea, unspecified: Secondary | ICD-10-CM | POA: Diagnosis not present

## 2016-10-14 DIAGNOSIS — R05 Cough: Secondary | ICD-10-CM | POA: Diagnosis not present

## 2016-10-14 DIAGNOSIS — D508 Other iron deficiency anemias: Secondary | ICD-10-CM | POA: Diagnosis not present

## 2016-10-14 DIAGNOSIS — N186 End stage renal disease: Secondary | ICD-10-CM | POA: Diagnosis not present

## 2016-10-14 DIAGNOSIS — Z9049 Acquired absence of other specified parts of digestive tract: Secondary | ICD-10-CM | POA: Diagnosis not present

## 2016-10-14 DIAGNOSIS — K922 Gastrointestinal hemorrhage, unspecified: Secondary | ICD-10-CM | POA: Diagnosis present

## 2016-10-14 DIAGNOSIS — E1122 Type 2 diabetes mellitus with diabetic chronic kidney disease: Secondary | ICD-10-CM | POA: Diagnosis not present

## 2016-10-14 DIAGNOSIS — I480 Paroxysmal atrial fibrillation: Secondary | ICD-10-CM | POA: Diagnosis not present

## 2016-10-14 DIAGNOSIS — Z9889 Other specified postprocedural states: Secondary | ICD-10-CM | POA: Diagnosis not present

## 2016-10-14 DIAGNOSIS — Z940 Kidney transplant status: Secondary | ICD-10-CM | POA: Diagnosis not present

## 2016-10-14 DIAGNOSIS — K921 Melena: Secondary | ICD-10-CM | POA: Diagnosis not present

## 2016-10-14 DIAGNOSIS — B3781 Candidal esophagitis: Secondary | ICD-10-CM | POA: Diagnosis not present

## 2016-10-14 DIAGNOSIS — R1011 Right upper quadrant pain: Secondary | ICD-10-CM | POA: Diagnosis not present

## 2016-10-14 LAB — IRON AND TIBC
Iron: 24 ug/dL — ABNORMAL LOW (ref 28–170)
SATURATION RATIOS: 8 % — AB (ref 10.4–31.8)
TIBC: 301 ug/dL (ref 250–450)
UIBC: 277 ug/dL

## 2016-10-14 LAB — BASIC METABOLIC PANEL
Anion gap: 12 (ref 5–15)
BUN: 10 mg/dL (ref 6–20)
CHLORIDE: 101 mmol/L (ref 101–111)
CO2: 25 mmol/L (ref 22–32)
CREATININE: 0.8 mg/dL (ref 0.44–1.00)
Calcium: 9.6 mg/dL (ref 8.9–10.3)
GFR calc non Af Amer: >60 mL/min (ref >60)
GLUCOSE: 105 mg/dL — AB (ref 65–99)
Potassium: 3.6 mmol/L (ref 3.5–5.1)
Sodium: 138 mmol/L (ref 135–145)

## 2016-10-14 LAB — EXPECTORATED SPUTUM ASSESSMENT W GRAM STAIN, RFLX TO RESP C

## 2016-10-14 LAB — CBC
HCT: 25 % — ABNORMAL LOW (ref 36.0–46.0)
HEMATOCRIT: 25.4 % — AB (ref 36.0–46.0)
HEMATOCRIT: 25.2 % — AB (ref 36.0–46.0)
HEMOGLOBIN: 7.6 g/dL — AB (ref 12.0–15.0)
HEMOGLOBIN: 7.6 g/dL — AB (ref 12.0–15.0)
HEMOGLOBIN: 7.5 g/dL — AB (ref 12.0–15.0)
MCH: 25.8 pg — AB (ref 26.0–34.0)
MCH: 26 pg (ref 26.0–34.0)
MCH: 25.9 pg — AB (ref 26.0–34.0)
MCHC: 29.9 g/dL — ABNORMAL LOW (ref 30.0–36.0)
MCHC: 30.2 g/dL (ref 30.0–36.0)
MCHC: 30 g/dL (ref 30.0–36.0)
MCV: 86.1 fL (ref 78.0–100.0)
MCV: 86.3 fL (ref 78.0–100.0)
MCV: 86.2 fL (ref 78.0–100.0)
Platelets: 360 10*3/uL (ref 150–400)
Platelets: 341 10*3/uL (ref 150–400)
Platelets: 319 10*3/uL (ref 150–400)
RBC: 2.95 MIL/uL — AB (ref 3.87–5.11)
RBC: 2.92 MIL/uL — AB (ref 3.87–5.11)
RBC: 2.9 MIL/uL — ABNORMAL LOW (ref 3.87–5.11)
RDW: 17.1 % — ABNORMAL HIGH (ref 11.5–15.5)
RDW: 16.9 % — ABNORMAL HIGH (ref 11.5–15.5)
RDW: 16.8 % — ABNORMAL HIGH (ref 11.5–15.5)
WBC: 6.6 10*3/uL (ref 4.0–10.5)
WBC: 6.3 10*3/uL (ref 4.0–10.5)
WBC: 6.6 10*3/uL (ref 4.0–10.5)

## 2016-10-14 LAB — FOLATE: FOLATE: 9.3 ng/mL (ref >5.9)

## 2016-10-14 LAB — FERRITIN: FERRITIN: 9 ng/mL — AB (ref 11–307)

## 2016-10-14 LAB — RETICULOCYTES
RBC.: 2.91 MIL/uL — AB (ref 3.87–5.11)
RETIC CT PCT: 2.5 % (ref 0.4–3.1)
Retic Count, Absolute: 72.8 10*3/uL (ref 19.0–186.0)

## 2016-10-14 LAB — STREP PNEUMONIAE URINARY ANTIGEN: Strep Pneumo Urinary Antigen: NEGATIVE

## 2016-10-14 LAB — PROTIME-INR
INR: 1.08
Prothrombin Time: 14 seconds (ref 11.4–15.2)

## 2016-10-14 LAB — GLUCOSE, CAPILLARY
GLUCOSE-CAPILLARY: 96 mg/dL (ref 65–99)
GLUCOSE-CAPILLARY: 84 mg/dL (ref 65–99)
GLUCOSE-CAPILLARY: 117 mg/dL — AB (ref 65–99)
GLUCOSE-CAPILLARY: 99 mg/dL (ref 65–99)
Glucose-Capillary: 96 mg/dL (ref 65–99)
Glucose-Capillary: 96 mg/dL (ref 65–99)

## 2016-10-14 LAB — MRSA PCR SCREENING: MRSA BY PCR: NEGATIVE

## 2016-10-14 LAB — EXPECTORATED SPUTUM ASSESSMENT W REFEX TO RESP CULTURE

## 2016-10-14 LAB — HIV ANTIBODY (ROUTINE TESTING W REFLEX): HIV Screen 4th Generation wRfx: NONREACTIVE

## 2016-10-14 LAB — PREPARE RBC (CROSSMATCH)

## 2016-10-14 LAB — VITAMIN B12: Vitamin B-12: 389 pg/mL (ref 180–914)

## 2016-10-14 MED ORDER — AZATHIOPRINE 50 MG PO TABS
125.0000 mg | ORAL_TABLET | Freq: Every evening | ORAL | Status: DC
  Administered 2016-10-14 – 2016-10-16 (×3): 125 mg via ORAL
  Filled 2016-10-14 (×4): qty 3

## 2016-10-14 MED ORDER — ACETAMINOPHEN 650 MG RE SUPP: 650.0000 mg | Freq: Four times a day (QID) | RECTAL | Status: DC | PRN

## 2016-10-14 MED ORDER — OXYCODONE HCL 5 MG PO TABS
5.0000 mg | ORAL_TABLET | Freq: Three times a day (TID) | ORAL | Status: DC | PRN
  Administered 2016-10-14 – 2016-10-17 (×3): 5 mg via ORAL
  Filled 2016-10-14 (×3): qty 1

## 2016-10-14 MED ORDER — SODIUM CHLORIDE 0.9 % IV SOLN: Freq: Once | INTRAVENOUS | Status: DC

## 2016-10-14 MED ORDER — PANTOPRAZOLE SODIUM 40 MG IV SOLR: 40.0000 mg | Freq: Two times a day (BID) | INTRAVENOUS | Status: DC

## 2016-10-14 MED ORDER — FLUTICASONE PROPIONATE 50 MCG/ACT NA SUSP
2.0000 | Freq: Every day | NASAL | Status: DC
  Administered 2016-10-14 – 2016-10-17 (×3): 2 via NASAL
  Filled 2016-10-14: qty 16

## 2016-10-14 MED ORDER — CALCITRIOL 0.25 MCG PO CAPS
0.2500 ug | ORAL_CAPSULE | Freq: Every morning | ORAL | Status: DC
  Administered 2016-10-14 – 2016-10-17 (×4): 0.25 ug via ORAL
  Filled 2016-10-14 (×5): qty 1

## 2016-10-14 MED ORDER — CYCLOBENZAPRINE HCL 10 MG PO TABS
10.0000 mg | ORAL_TABLET | Freq: Two times a day (BID) | ORAL | Status: DC
  Administered 2016-10-14 – 2016-10-17 (×7): 10 mg via ORAL
  Filled 2016-10-14 (×9): qty 1

## 2016-10-14 MED ORDER — SODIUM CHLORIDE 0.9 % IV SOLN
8.0000 mg/h | INTRAVENOUS | Status: DC
  Administered 2016-10-14 (×3): 8 mg/h via INTRAVENOUS
  Filled 2016-10-14 (×7): qty 80

## 2016-10-14 MED ORDER — ONDANSETRON HCL 4 MG PO TABS
4.0000 mg | ORAL_TABLET | Freq: Four times a day (QID) | ORAL | Status: DC | PRN
  Administered 2016-10-15 – 2016-10-16 (×2): 4 mg via ORAL
  Filled 2016-10-14 (×2): qty 1

## 2016-10-14 MED ORDER — PROMETHAZINE HCL 25 MG PO TABS
25.0000 mg | ORAL_TABLET | Freq: Every evening | ORAL | Status: DC | PRN
  Administered 2016-10-14 – 2016-10-16 (×4): 25 mg via ORAL
  Filled 2016-10-14 (×5): qty 1

## 2016-10-14 MED ORDER — MOMETASONE FURO-FORMOTEROL FUM 200-5 MCG/ACT IN AERO
2.0000 | INHALATION_SPRAY | Freq: Two times a day (BID) | RESPIRATORY_TRACT | Status: DC
  Administered 2016-10-15 – 2016-10-17 (×4): 2 via RESPIRATORY_TRACT
  Filled 2016-10-14: qty 8.8

## 2016-10-14 MED ORDER — DULOXETINE HCL 60 MG PO CPEP
60.0000 mg | ORAL_CAPSULE | Freq: Every morning | ORAL | Status: DC
  Administered 2016-10-14 – 2016-10-17 (×4): 60 mg via ORAL
  Filled 2016-10-14 (×4): qty 1

## 2016-10-14 MED ORDER — NITROGLYCERIN 0.4 MG SL SUBL
0.4000 mg | SUBLINGUAL_TABLET | SUBLINGUAL | Status: DC | PRN
  Administered 2016-10-17: 0.4 mg via SUBLINGUAL
  Filled 2016-10-14: qty 1

## 2016-10-14 MED ORDER — ALBUTEROL SULFATE (2.5 MG/3ML) 0.083% IN NEBU: 3.0000 mL | INHALATION_SOLUTION | RESPIRATORY_TRACT | Status: DC | PRN

## 2016-10-14 MED ORDER — METHYLPREDNISOLONE SODIUM SUCC 40 MG IJ SOLR: 20.0000 mg | Freq: Every day | INTRAMUSCULAR | Status: DC

## 2016-10-14 MED ORDER — LEVOFLOXACIN IN D5W 500 MG/100ML IV SOLN
500.0000 mg | INTRAVENOUS | Status: DC
  Administered 2016-10-15 – 2016-10-16 (×2): 500 mg via INTRAVENOUS
  Filled 2016-10-14 (×2): qty 100

## 2016-10-14 MED ORDER — SODIUM CHLORIDE 0.9 % IV SOLN
80.0000 mg | Freq: Once | INTRAVENOUS | Status: AC
  Administered 2016-10-14: 80 mg via INTRAVENOUS
  Filled 2016-10-14: qty 80

## 2016-10-14 MED ORDER — ONDANSETRON HCL 4 MG/2ML IJ SOLN
4.0000 mg | Freq: Four times a day (QID) | INTRAMUSCULAR | Status: DC | PRN
  Administered 2016-10-14 – 2016-10-17 (×5): 4 mg via INTRAVENOUS
  Filled 2016-10-14 (×5): qty 2

## 2016-10-14 MED ORDER — CARBAMAZEPINE 200 MG PO TABS
200.0000 mg | ORAL_TABLET | Freq: Two times a day (BID) | ORAL | Status: DC
  Administered 2016-10-14 – 2016-10-17 (×7): 200 mg via ORAL
  Filled 2016-10-14 (×7): qty 1

## 2016-10-14 MED ORDER — SERTRALINE HCL 100 MG PO TABS
100.0000 mg | ORAL_TABLET | Freq: Every morning | ORAL | Status: DC
  Administered 2016-10-14 – 2016-10-17 (×4): 100 mg via ORAL
  Filled 2016-10-14 (×4): qty 1

## 2016-10-14 MED ORDER — ROSUVASTATIN CALCIUM 10 MG PO TABS
20.0000 mg | ORAL_TABLET | Freq: Every day | ORAL | Status: DC
  Administered 2016-10-14 – 2016-10-16 (×3): 20 mg via ORAL
  Filled 2016-10-14 (×3): qty 2

## 2016-10-14 MED ORDER — MONTELUKAST SODIUM 10 MG PO TABS
10.0000 mg | ORAL_TABLET | Freq: Every day | ORAL | Status: DC
  Administered 2016-10-14 – 2016-10-16 (×4): 10 mg via ORAL
  Filled 2016-10-14 (×4): qty 1

## 2016-10-14 MED ORDER — DILTIAZEM HCL ER COATED BEADS 120 MG PO CP24
120.0000 mg | ORAL_CAPSULE | Freq: Every day | ORAL | Status: DC
  Administered 2016-10-15 – 2016-10-17 (×3): 120 mg via ORAL
  Filled 2016-10-14 (×4): qty 1

## 2016-10-14 MED ORDER — LEVOFLOXACIN IN D5W 750 MG/150ML IV SOLN
750.0000 mg | INTRAVENOUS | Status: DC
  Administered 2016-10-14: 750 mg via INTRAVENOUS
  Filled 2016-10-14: qty 150

## 2016-10-14 MED ORDER — INSULIN ASPART 100 UNIT/ML ~~LOC~~ SOLN
0.0000 [IU] | SUBCUTANEOUS | Status: DC
  Administered 2016-10-17 (×2): 2 [IU] via SUBCUTANEOUS

## 2016-10-14 MED ORDER — LORAZEPAM 1 MG PO TABS
1.0000 mg | ORAL_TABLET | Freq: Every morning | ORAL | Status: DC
  Administered 2016-10-14 – 2016-10-17 (×4): 1 mg via ORAL
  Filled 2016-10-14 (×4): qty 1

## 2016-10-14 MED ORDER — ACETAMINOPHEN 325 MG PO TABS
650.0000 mg | ORAL_TABLET | Freq: Four times a day (QID) | ORAL | Status: DC | PRN
  Administered 2016-10-14 – 2016-10-17 (×7): 650 mg via ORAL
  Filled 2016-10-14 (×8): qty 2

## 2016-10-14 NOTE — Progress Notes (Signed)
PROGRESS NOTE    Katherine [name]  ZDG:[id number] DOB: 02/07/1956 DOA: 10/13/2016 PCP: Placido Sou, MD    Brief Narrative:  This is a 61 yo female with history of CAD on plavix, presents with the chief complain of fatigue, weakness and cough. Has been having epigastric abdominal pain, associated with diarrhea and black stools. Follows with Dr. Kalman Shan for GI. On the initial physical examination found to be pale, but hemodynamically stable. Hb down to the 7 range. Admitted for blood transfusion and further GI diagnostics.    Assessment & Plan:   Principal Problem:   Symptomatic anemia Active Problems:   Sleep apnea   Morbid obesity - s/p Lap Band 9/'05   S/P CABG (coronary artery bypass graft) x 3   ST elevation myocardial infarction (STEMI) of inferoposterior wall; H/o -- Rescue PTCA of RCA -- referred for CABG   CAD S/P percutaneous coronary angioplasty - PCI x 5 to SVG-D1   Hypertension associated with diabetes ([place])   PAD (peripheral artery disease) ([facility])   PAF (paroxysmal atrial fibrillation) ([facility])   Acute GI bleeding   Lung nodules   Anemia   1. Acute blood loss anemia due to upper GI bleed. Will continue to keep patient NPO for now, continue antiacid therapy with proton pump inhibitors IV, I called GI for consultation. Avoid non steroidal anti-inflammatory agents.   2. CAD sp CABG. Will continue rosuvastatin. No chest pain or angina.   3. SP renal transplant. Continue immunosuppressive therapy with azathioprine.    4. T2DM. Will continue glucose cover and monitoring with insulin sliding scale, patient is NPO for now.   5, Asthma. Controlled with no signs of exacerbation.   6. Lung nodules. Will continue antibiotic therapy for now, with levofloxacin. Chest film noted multiple bilateral nodules. Note that patient is on chronic immunosuppressive therapy. Need to rule out opportunistic infections.   7. HTN. Blood pressure with systolic in the 95'J.   8.  Paroxysmal atrial fibrillation. Will continue rate control with diltiazem, will reduce the dose to prevent worsening hypotension.   9. Depression.  Continue duloxetine, tegretol, lorazepam and sertraline.    DVT prophylaxis: scd  Code Status: full  Family Communication: No family at the bedside Disposition Plan: Home    Consultants:   Gastroenterology   Procedures:   Antimicrobials:   Subjective: Patient not feeling well, no nausea or vomiting, no abdominal pain or distention. No chest pain or dyspnea.   Objective: Vitals:   10/13/16 2130 10/13/16 2315 10/13/16 2358 10/14/16 0315  BP: 134/69 147/70 (!) 158/74   Pulse: 77 74 82   Resp:  20 20   Temp:   97.9 F (36.6 C)   TempSrc:   Oral   SpO2: 100% 99% 99%   Weight:   82.2 kg (181 lb 3.2 oz) 81.4 kg (179 lb 8 oz)  Height:   5\' 1"  (1.549 m)     Intake/Output Summary (Last 24 hours) at 10/14/16 0939 Last data filed at 10/14/16 0800  Gross per 24 hour  Intake                0 ml  Output              200 ml  Net             -200 ml   Filed Weights   10/13/16 1919 10/13/16 2358 10/14/16 0315  Weight: 80.7 kg (178 lb) 82.2 kg (181 lb 3.2 oz) 81.4 kg (179 lb  8 oz)    Examination:  General exam: deconditioned and ill looking appearing.  ENT: positive pallor, but no icterus, oral mucosa dry.  Respiratory system: Mild decreased breath sounds bilaterally, no wheezing, rales or rhonchi. Respiratory effort normal. Cardiovascular system: S1 & S2 heard, RRR. No JVD, murmurs, rubs, gallops or clicks. No pedal edema. Gastrointestinal system: Abdomen is nondistended, soft and nontender. No organomegaly or masses felt. Normal bowel sounds heard. Central nervous system: Alert and oriented. No focal neurological deficits. Extremities: Symmetric 5 x 5 power. Skin: No rashes, lesions or ulcers  Data Reviewed: I have personally reviewed following labs and imaging studies  CBC:  Recent Labs Lab 10/13/16 2104 10/14/16 0141  10/14/16 0621  WBC 6.9 6.6 6.3  HGB 7.6* 7.6* 7.6*  HCT 25.3* 25.4* 25.2*  MCV 85.8 86.1 86.3  PLT 359 360 161   Basic Metabolic Panel:  Recent Labs Lab 10/13/16 2104 10/14/16 0141  NA 138 138  K 3.5 3.6  CL 101 101  CO2 24 25  GLUCOSE 121* 105*  BUN 11 10  CREATININE 0.87 0.80  CALCIUM 9.4 9.6   GFR: Estimated Creatinine Clearance: 73.2 mL/min (by C-G formula based on SCr of 0.8 mg/dL). Liver Function Tests:  Recent Labs Lab 10/13/16 2104  AST 22  ALT 10*  ALKPHOS 112  BILITOT 0.1*  PROT 6.6  ALBUMIN 3.0*    Recent Labs Lab 10/13/16 2104  LIPASE 19   No results for input(s): AMMONIA in the last 168 hours. Coagulation Profile: No results for input(s): INR, PROTIME in the last 168 hours. Cardiac Enzymes: No results for input(s): CKTOTAL, CKMB, CKMBINDEX, TROPONINI in the last 168 hours. BNP (last 3 results)  Recent Labs  02/07/16 1224  PROBNP 139.0*   HbA1C: No results for input(s): HGBA1C in the last 72 hours. CBG:  Recent Labs Lab 10/14/16 0135 10/14/16 0405 10/14/16 0804  GLUCAP 96 96 96   Lipid Profile: No results for input(s): CHOL, HDL, LDLCALC, TRIG, CHOLHDL, LDLDIRECT in the last 72 hours. Thyroid Function Tests: No results for input(s): TSH, T4TOTAL, FREET4, T3FREE, THYROIDAB in the last 72 hours. Anemia Panel:  Recent Labs  10/14/16 0141  VITAMINB12 389  FOLATE 9.3  FERRITIN 9*  TIBC 301  IRON 24*  RETICCTPCT 2.5   Sepsis Labs: No results for input(s): PROCALCITON, LATICACIDVEN in the last 168 hours.  Recent Results (from the past 240 hour(s))  MRSA PCR Screening     Status: None   Collection Time: 10/14/16  3:25 AM  Result Value Ref Range Status   MRSA by PCR NEGATIVE NEGATIVE Final    Comment:        The GeneXpert MRSA Assay (FDA approved for NASAL specimens only), is one component of a comprehensive MRSA colonization surveillance program. It is not intended to diagnose MRSA infection nor to guide or monitor  treatment for MRSA infections.          Radiology Studies: Dg Chest 2 View  Result Date: 10/13/2016 CLINICAL DATA:  Palpitations and shortness of breath. EXAM: CHEST  2 VIEW COMPARISON:  08/31/2016 FINDINGS: Post median sternotomy and CABG. Unchanged heart size and mediastinal contours, heart upper limits of normal. Probable hiatal hernia. Development of multifocal ill-defined opacities within both lungs, right greater than left, some of which appear nodular. Streaky scarring in the left mid lung. Gastric band is faintly visualized. No acute osseous abnormalities are seen. IMPRESSION: Multifocal opacities in both lungs that appear nodule or, right greater than left. These appear  new from exam 6 weeks prior. This may be infectious or inflammatory, however CT characterization may be helpful. Electronically Signed   By: Jeb Levering M.D.   On: 10/13/2016 20:26   Ct Chest Wo Contrast  Result Date: 10/14/2016 CLINICAL DATA:  Assess pulmonary nodules noted on chest radiograph. Initial encounter. EXAM: CT CHEST WITHOUT CONTRAST TECHNIQUE: Multidetector CT imaging of the chest was performed following the standard protocol without IV contrast. COMPARISON:  Chest radiograph from 10/13/2016 FINDINGS: Cardiovascular: The patient is status post median sternotomy. Diffuse coronary artery calcifications are seen. The heart remains normal in size. Scattered calcification is noted along the aortic arch and descending thoracic aorta. Mild calcification is seen at the proximal great vessels. Mediastinum/Nodes: The esophagus is filled with fluid, raising question for esophageal dysmotility or gastroesophageal reflux. Scattered calcifications and heterogeneity are noted at the thyroid gland. No dominant mass is seen. No mediastinal lymphadenopathy is seen. No pericardial effusion is identified. No axillary lymphadenopathy is identified. Lungs/Pleura: Multiple scattered nodules and associated tiny opacities are noted  throughout both lungs, more prominent on the right side. Given that these are new from January, these are most likely infectious in nature. No pleural effusion or pneumothorax is seen. No dominant mass is identified. Upper Abdomen: The mildly nodular contour of the liver raises question for mild hepatic cirrhosis. Peripheral calcification is seen at the spleen, possibly reflecting remote injury. The patient is status post cholecystectomy, with clips noted at the gallbladder fossa. The visualized portions of the pancreas and adrenal glands are within normal limits. There is chronic severe bilateral renal atrophy. A gastric band is noted. Musculoskeletal: No acute osseous abnormalities are identified. Mild multilevel vacuum phenomenon is noted along the thoracic spine. The visualized musculature is unremarkable in appearance. IMPRESSION: 1. Scattered nodules and associated tiny opacities within both lungs, more prominent on the right. Given that these are new from January, they are most likely infectious in nature. Would suggest Pulmonary consultation for further evaluation, to assess the underlying infectious etiology. 2. Esophagus filled with fluid, raising question for esophageal dysmotility or gastroesophageal reflux. 3. Diffuse coronary artery calcifications seen. 4. Suggestion of mild hepatic cirrhosis. 5. Chronic severe bilateral renal atrophy. Electronically Signed   By: Garald Balding M.D.   On: 10/14/2016 02:58        Scheduled Meds: . sodium chloride   Intravenous Once  . azaTHIOprine  125 mg Oral QPM  . calcitRIOL  0.25 mcg Oral q morning - 10a  . carbamazepine  200 mg Oral BID  . cyclobenzaprine  10 mg Oral BID  . diltiazem  120 mg Oral Daily  . DULoxetine  60 mg Oral q morning - 10a  . fluticasone  2 spray Each Nare Daily  . insulin aspart  0-9 Units Subcutaneous Q4H  . levofloxacin (LEVAQUIN) IV  750 mg Intravenous Q24H  . LORazepam  1 mg Oral q morning - 10a  . methylPREDNISolone  (SOLU-MEDROL) injection  20 mg Intravenous Daily  . mometasone-formoterol  2 puff Inhalation BID  . montelukast  10 mg Oral QHS  . [START ON 10/17/2016] pantoprazole  40 mg Intravenous Q12H  . rosuvastatin  20 mg Oral q1800  . sertraline  100 mg Oral q morning - 10a   Continuous Infusions: . pantoprozole (PROTONIX) infusion 8 mg/hr (10/14/16 0204)     LOS: 0 days      Kassidie Hendriks Gerome Apley, MD [facility] Pager [phone]  If 7PM-7AM, please contact night-coverage [email or web address] Password [facility] 10/14/2016, 9:39 AM

## 2016-10-14 NOTE — Consult Note (Addendum)
Referring Provider: Dr. Arrien  Primary Care Physician:  DETERDING,JAMES L, MD Primary Gastroenterologist:  Dr. Buccini  Reason for Consultation:    HPI: Katherine [name] is a 61 y.o. female with past medical history of coronary artery disease currently on Plavix, history of gastric lap band in 2005, history of paroxysmal atrial fibrillation, history of renal transplant on immunosuppression presented to the hospital with fatigue and weakness. Upon initial evaluation patient was found to hemoglobin of 7.6. Normal LFTs. Normal platelets. Occult blood is negative. Ferritin of 9 consistent with iron deficiency anemia. Also has low iron saturation. CT chest was performed because of chest x-ray showing pulmonary nodules which revealed scattered nodules suspicious for infectious condition. Also showed possible mild hepatic cirrhosis based on mild nodular contour of the liver.  Patient seen and examined. Complaining of epigastric pain which started around 3-4 weeks ago. Patient started taking Alka-Seltzer for that. Subsequently patient started noticing diarrhea with black tarry stool. 2-3 bowel movements per day. Occasionally has normal stools in between. Last bowel movement was yesterday. Complaining of occasional nausea and vomiting since lap band surgery. Denied any blood in the vomiting. Has noted some bright blood on the tissue paper 2 days ago. Last dose of Plavix was yesterday morning. Denied dysphagia or odynophagia but complaining of acid reflux. Denied active chest pain. has baseline shortness of breath. Complaining of fatigue and weakness  Previous GI work up  --------------------------- EGD - 12/2010 - Normal  Colonoscopy 12/2010 - Small Tubular adenoma . Repeat was recommended in 5 years  Patient had multiple no show after that  Capsule Endoscopy 2007 - Limited study /poor prep/ no active bleeding seen  Last blood work in our EHR is from 2007   Past Medical History:  Diagnosis Date  .  Anxiety   . Bilateral carotid artery stenosis    Carotid duplex 02/2016: 1-39% LICA, 60-79% RICA, >50% RECA, f/u 1 yr suggested  . CAD (coronary artery disease) of bypass graft 5/01; 3/'02, 8/'03, 10/'04; 1/15   PCI x 5 to SVG-D1   . CAD in native artery 07/1993    3 Vessel Disease (LAD-D1 & RCA) -- CABG  . CAD S/P percutaneous coronary angioplasty    PCI to SVG-D1 insertion/native D1 x 4 = '01 -(S660 BMS 2.5 x 9 - insertion into D1; '02 - distal overlap ACS Pixel 2.5 x 8  BMS; '03 distal/native ISR/Thrombosis - Pixel 2.5 x 13; '04 - ISR-  Taxus 2.5 x 20 (covered all);; 1/15 - mid SVG-D1 (50% distal ISR) - Promus P 2.75 x 20 -- 2.8 mm  . COPD mixed type (HCC)    Followed by Dr. Byrum "pulmonologist said no COPD"  . Depression   . Depression with anxiety   . Diabetes mellitus type 2 in obese (HCC)   . Dyslipidemia, goal LDL below 70    08/2012: TC 137, TG 200, HDL 32!, LDL 45; on statin (followed by Dr.Deterding)  . Enlarged heart    "told one time"  . ESRD (end stage renal disease) (HCC) 1991   s/p Cadaveric Renal Transplant ([facility] - Dr. Deterding)   . Fibromyalgia   . GERD (gastroesophageal reflux disease)   . Glomerulonephritis, chronic, rapidly progressive 1989  . 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 07/1993   Rescue PTCA of RCA -- referred for CABG.  . H/O: GI bleed   . Hypertension associated with diabetes (HCC)   . MRSA (methicillin resistant staph aureus) culture positive   .   Obesity    s/p Lap Band 04/2004- Port Replacement 10/09 & 2/10 for infection; Dr. Hoxworth.  . OSA on CPAP    oxygen @night @ 2L  . PAD (peripheral artery disease) (HCC) 08/2013   LEA Dopplers to be read by Dr. Arida  . PAF (paroxysmal atrial fibrillation) (HCC)    Noted on CardioNet Monitor 11/5-18 - rate ~112  . Recurrent boils    Bilateral Groin  . Rheumatoid arthritis (HCC)    Per Patient Report; associated with OA  . 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 Gerhardt: LIMA-LAD,  SVG-bifurcatingD1, SVG-rPDA  . S/p cadaver renal transplant 1991   [facility]  . Stroke (HCC) 2012   "right eye stroke- half blind now"  . Unstable angina (HCC) 5/01; 3/'02, 8/'03, 10/'04; 1/15   x 5 occurences since Inf-Post STEMI in 1994    Past Surgical History:  Procedure Laterality Date  . CARDIAC CATHETERIZATION  5/'01, 3/'02, 8/'03, 10/'04; 1/'15   08/22/2013: LAD & RCA 100%; LIMA-LAD & SVG-rPDA patent; Cx-- OM1 60%, OM2 ostial ~50%; SVG-D1 - 80% mid, 50% distal ISR --PCI  . CARDIAC CATHETERIZATION  5/'01, 3/'02, 8/'03, 10/'04; 1/'15   08/22/2013: LAD & RCA 100%; LIMA-LAD & SVG-rPDA patent; Cx-- OM1 60%, OM2 ostial ~50%; SVG-D1 - 80% mid, 50% distal ISR --PCI  . CATHETER REMOVAL    . CHOLECYSTECTOMY N/A 10/29/2014   Procedure: LAPAROSCOPIC CHOLECYSTECTOMY WITH INTRAOPERATIVE CHOLANGIOGRAM;  Surgeon: Benjamin Hoxworth, MD;  Location: [facility];  Service: General;  Laterality: N/A;  . CORONARY ANGIOPLASTY  1994   x5  . CORONARY ARTERY BYPASS GRAFT  1995   LIMA-LAD, SVG-RPDA, SVG-D1  . INCISE AND DRAIN ABCESS    . KIDNEY TRANSPLANT  1991  . LAPAROSCOPIC GASTRIC BANDING  04/2004; 10/'09, 2/'10   Port Replacement x 2  . LEFT HEART CATHETERIZATION WITH CORONARY/GRAFT ANGIOGRAM N/A 08/23/2013   Procedure: LEFT HEART CATHETERIZATION WITH CORONARY/GRAFT ANGIOGRAM;  Surgeon: Muhammad A Arida, MD;  Location: [facility];  Service: Cardiovascular;  Laterality: N/A;  . Lower Extremity Arterial Dopplers  08/2013   ABI: R 0.96, L 1.04  . MULTIPLE TOOTH EXTRACTIONS  age 18  . PERCUTANEOUS CORONARY STENT INTERVENTION (PCI-S)  5/'01, 3/'02, 8/'03, 10/'04;   '01 - S660 BMS 2.5 x 9 - dSVG-D1 into D1; '02- post-stent stenosis - 2.5 x 8 Pixel BMS; '8\03: ISR/Thrombosis into native D1 - AngioJet, 2.5 x 13 Pixel; '04 - ISR 95% - covered stented area with Taxus DES 2.5 mm x 20 (2.88)  . PERCUTANEOUS CORONARY STENT INTERVENTION (PCI-S)  08/22/2013   mid SVG-D1 80%; distal stent ~50% ISR; Promus Prermier DES 2.75 mm xc 20  mm (2.8 mm)  . PERCUTANEOUS CORONARY STENT INTERVENTION (PCI-S)  08/23/2013   Procedure: PERCUTANEOUS CORONARY STENT INTERVENTION (PCI-S);  Surgeon: Muhammad A Arida, MD;  Location: [facility];  Service: Cardiovascular;;  . PORT-A-CATH REMOVAL     kidney  . TRANSTHORACIC ECHOCARDIOGRAM  02/2013   NL LV Size, EF 55-60%; MAC with Mild MR.  . TUBAL LIGATION    . wrist fistula repair Left    dialysis for one year    Prior to Admission medications   Medication Sig Start Date End Date Taking? Authorizing Provider  azaTHIOprine (IMURAN) 50 MG tablet Take 125 mg by mouth every evening.    Yes Historical Provider, MD  budesonide-formoterol (SYMBICORT) 160-4.5 MCG/ACT inhaler Inhale 2 puffs into the lungs 2 (two) times daily. 06/16/16 07/17/17 Yes Robert S Byrum, MD  calcitRIOL (ROCALTROL)   0.25 MCG capsule Take 0.25 mcg by mouth every morning.    Yes Historical Provider, MD  carbamazepine (TEGRETOL) 200 MG tablet Take 200 mg three x daily. Patient taking differently: Take 200 mg by mouth 2 (two) times daily.  05/19/16  Yes Zachary T Swartz, MD  clopidogrel (PLAVIX) 75 MG tablet TAKE 1 TABLET (75 MG TOTAL) BY MOUTH DAILY. 02/11/16  Yes David W Harding, MD  cyclobenzaprine (FLEXERIL) 10 MG tablet Take 10 mg by mouth 2 (two) times daily.    Yes Historical Provider, MD  diltiazem (CARDIZEM CD) 120 MG 24 hr capsule Take 1 capsule (120 mg total) by mouth daily. NEED OV. 08/13/16  Yes David W Harding, MD  diphenhydramine-acetaminophen (TYLENOL PM) 25-500 MG TABS tablet Take 2 tablets by mouth at bedtime as needed (for sleep).   Yes Historical Provider, MD  DULoxetine (CYMBALTA) 30 MG capsule TAKE 2 CAPSULES (60 MG TOTAL) BY MOUTH EVERY MORNING. 09/29/16  Yes Zachary T Swartz, MD  fluticasone (FLONASE) 50 MCG/ACT nasal spray PLACE 2 SPRAYS INTO THE NOSE DAILY 08/28/16  Yes Robert S Byrum, MD  glimepiride (AMARYL) 4 MG tablet Take 4 mg by mouth every morning. 04/20/16  Yes Historical Provider, MD  LORazepam (ATIVAN) 1  MG tablet Take 1 mg by mouth every morning.    Yes Historical Provider, MD  metFORMIN (GLUCOPHAGE) 500 MG tablet Take 500 mg by mouth 2 (two) times daily. 10/03/16  Yes Historical Provider, MD  metoprolol tartrate (LOPRESSOR) 25 MG tablet Take half tablet (12.5 mg total) by mouth daily. Take whole tablet (25 mg total) by mouth daily if SBP > 150. Patient taking differently: Take 12.5-25 mg by mouth daily as needed (for BP). Take half tablet (12.5 mg total) by mouth daily. Take whole tablet (25 mg total) by mouth daily if SBP > 150. 02/20/16  Yes Kathryn R Thompson, PA-C  montelukast (SINGULAIR) 10 MG tablet TAKE 1 TABLET (10 MG TOTAL) BY MOUTH DAILY. 07/06/16  Yes Robert S Byrum, MD  omeprazole (PRILOSEC) 40 MG capsule Take 40 mg by mouth every morning.    Yes Historical Provider, MD  oxyCODONE (OXY IR/ROXICODONE) 5 MG immediate release tablet Take 1 tablet (5 mg total) by mouth every 8 (eight) hours as needed for severe pain. 05/19/16  Yes Zachary T Swartz, MD  predniSONE (DELTASONE) 5 MG tablet Take 5 mg by mouth every morning.    Yes Historical Provider, MD  promethazine (PHENERGAN) 25 MG tablet Take 25 mg by mouth at bedtime as needed for nausea or vomiting. For nausea and sleep   Yes Historical Provider, MD  rosuvastatin (CRESTOR) 20 MG tablet TAKE 1 TABLET (20 MG TOTAL) BY MOUTH DAILY. 06/29/16  Yes David W Harding, MD  sertraline (ZOLOFT) 100 MG tablet Take 100 mg by mouth every morning.   Yes Historical Provider, MD  VENTOLIN HFA 108 (90 Base) MCG/ACT inhaler INHALE 2 PUFFS INTO THE LUNGS EVERY 6 (SIX) HOURS AS NEEDED FOR WHEEZING OR SHORTNESS OF BREATH. 10/13/16  Yes Robert S Byrum, MD  acetaminophen (TYLENOL) 500 MG tablet Take 1,500 mg by mouth every 6 (six) hours as needed for moderate pain or headache.    Historical Provider, MD  nitroGLYCERIN (NITROSTAT) 0.4 MG SL tablet Place 1 tablet (0.4 mg total) under the tongue every 5 (five) minutes as needed for chest pain. 09/11/13   David W Harding, MD     Scheduled Meds: . sodium chloride   Intravenous Once  . azaTHIOprine  125 mg Oral QPM  .   calcitRIOL  0.25 mcg Oral q morning - 10a  . carbamazepine  200 mg Oral BID  . cyclobenzaprine  10 mg Oral BID  . diltiazem  120 mg Oral Daily  . DULoxetine  60 mg Oral q morning - 10a  . fluticasone  2 spray Each Nare Daily  . insulin aspart  0-9 Units Subcutaneous Q4H  . [START ON 10/15/2016] levofloxacin (LEVAQUIN) IV  500 mg Intravenous Q24H  . LORazepam  1 mg Oral q morning - 10a  . mometasone-formoterol  2 puff Inhalation BID  . montelukast  10 mg Oral QHS  . [START ON 10/17/2016] pantoprazole  40 mg Intravenous Q12H  . rosuvastatin  20 mg Oral q1800  . sertraline  100 mg Oral q morning - 10a   Continuous Infusions: . pantoprozole (PROTONIX) infusion 8 mg/hr (10/14/16 0204)   PRN Meds:.acetaminophen **OR** acetaminophen, albuterol, nitroGLYCERIN, ondansetron **OR** ondansetron (ZOFRAN) IV, oxyCODONE, promethazine  Allergies as of 10/13/2016 - Review Complete 10/13/2016  Allergen Reaction Noted  . Codeine Nausea And Vomiting   . Erythromycin Nausea And Vomiting   . Hydromorphone hcl Nausea And Vomiting   . Morphine and related Nausea And Vomiting 10/19/2014  . Nalbuphine Nausea And Vomiting   . Niaspan [niacin er] Other (See Comments) 10/08/2012  . Sulfonamide derivatives Other (See Comments)   . Tetracycline Hives     Family History  Problem Relation Age of Onset  . Cancer Mother     liver  . Heart disease Father   . Cancer Father     colon  . Arrhythmia Brother     Atrial Fibrillation  . Arrhythmia Paternal Aunt     Atrial Fibrillation    Social History   Social History  . Marital status: Married    Spouse name: N/A  . Number of children: N/A  . Years of education: N/A   Occupational History  . Not on file.   Social History Main Topics  . Smoking status: Former Smoker    Packs/day: 1.00    Years: 30.00    Types: Cigarettes    Quit date: 08/17/2002  .  Smokeless tobacco: Never Used  . Alcohol use No  . Drug use: No  . Sexual activity: Not on file   Other Topics Concern  . 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 Prior to his diagnosis of her husband, she actually had adopted a 5-year-old child who she knows caring for as well. With all the surgeries, they have been quite financially troubled. Thanks the help of her community and church, they have been able to stay "alfoat."     She is a former smoker who quit in 2004 after a 30-pack-year history.   She is active chasing a 5-year-old child, does not do routine exercise. She's been quite depressed with the condition of her husband, and admits to eating comfort herself.   She does not drink alcohol.    Review of Systems: All negative except as stated above in HPI. Review of Systems  Constitutional: Positive for malaise/fatigue.  HENT: Negative for ear discharge, hearing loss and nosebleeds.   Eyes: Negative for double vision and photophobia.  Respiratory: Positive for cough and shortness of breath.   Cardiovascular: Negative for chest pain and palpitations.  Gastrointestinal: Positive for abdominal pain, blood in stool, heartburn, melena, nausea and vomiting.    Genitourinary: Negative for dysuria and urgency.  Musculoskeletal: Positive for back pain and joint pain.  Skin: Negative for rash.  Neurological: Positive for dizziness and weakness. Negative for sensory change and headaches.  Endo/Heme/Allergies: Bruises/bleeds easily.  Psychiatric/Behavioral: Negative for hallucinations and suicidal ideas.    Physical Exam: Vital signs: Vitals:   10/13/16 2315 10/13/16 2358  BP: 147/70 (!) 158/74  Pulse: 74 82  Resp: 20 20  Temp:  97.9 F (36.6 C)   Last BM Date: 10/13/16 General:    Well-developed, well-nourished, pleasant and cooperative in  NAD HEENT : NS, AT , EOMI.  No scleral icterus Lungs:  Fine bibasilar rhonchi. No acute distress. Heart:  Regular rate and rhythm; murmur noted Abdomen: soft, epigastric tenderness palpation, nondistended, bowel sounds present. No peritoneal signs. No rebound LE : no edema  Psych : alert oriented 3. Mood and affect normal. Rectal:  Deferred  GI:  Lab Results:  Recent Labs  10/14/16 0141 10/14/16 0621 10/14/16 0926  WBC 6.6 6.3 6.6  HGB 7.6* 7.6* 7.5*  HCT 25.4* 25.2* 25.0*  PLT 360 341 319   BMET  Recent Labs  10/13/16 2104 10/14/16 0141  NA 138 138  K 3.5 3.6  CL 101 101  CO2 24 25  GLUCOSE 121* 105*  BUN 11 10  CREATININE 0.87 0.80  CALCIUM 9.4 9.6   LFT  Recent Labs  10/13/16 2104  PROT 6.6  ALBUMIN 3.0*  AST 22  ALT 10*  ALKPHOS 112  BILITOT 0.1*   PT/INR No results for input(s): LABPROT, INR in the last 72 hours.   Studies/Results: Dg Chest 2 View  Result Date: 10/13/2016 CLINICAL DATA:  Palpitations and shortness of breath. EXAM: CHEST  2 VIEW COMPARISON:  08/31/2016 FINDINGS: Post median sternotomy and CABG. Unchanged heart size and mediastinal contours, heart upper limits of normal. Probable hiatal hernia. Development of multifocal ill-defined opacities within both lungs, right greater than left, some of which appear nodular. Streaky scarring in the left mid lung. Gastric band is faintly visualized. No acute osseous abnormalities are seen. IMPRESSION: Multifocal opacities in both lungs that appear nodule or, right greater than left. These appear new from exam 6 weeks prior. This may be infectious or inflammatory, however CT characterization may be helpful. Electronically Signed   By: Melanie  Ehinger M.D.   On: 10/13/2016 20:26   Ct Chest Wo Contrast  Result Date: 10/14/2016 CLINICAL DATA:  Assess pulmonary nodules noted on chest radiograph. Initial encounter. EXAM: CT CHEST WITHOUT CONTRAST TECHNIQUE: Multidetector CT imaging of the chest was  performed following the standard protocol without IV contrast. COMPARISON:  Chest radiograph from 10/13/2016 FINDINGS: Cardiovascular: The patient is status post median sternotomy. Diffuse coronary artery calcifications are seen. The heart remains normal in size. Scattered calcification is noted along the aortic arch and descending thoracic aorta. Mild calcification is seen at the proximal great vessels. Mediastinum/Nodes: The esophagus is filled with fluid, raising question for esophageal dysmotility or gastroesophageal reflux. Scattered calcifications and heterogeneity are noted at the thyroid gland. No dominant mass is seen. No mediastinal lymphadenopathy is seen. No pericardial effusion is identified. No axillary lymphadenopathy is identified. Lungs/Pleura: Multiple scattered nodules and associated tiny opacities are noted throughout both lungs, more prominent on the right side. Given that these are new from January, these are most likely infectious in nature. No pleural effusion or pneumothorax is seen. No dominant mass is identified. Upper Abdomen: The mildly nodular contour of the liver raises question for mild hepatic cirrhosis.   Peripheral calcification is seen at the spleen, possibly reflecting remote injury. The patient is status post cholecystectomy, with clips noted at the gallbladder fossa. The visualized portions of the pancreas and adrenal glands are within normal limits. There is chronic severe bilateral renal atrophy. A gastric band is noted. Musculoskeletal: No acute osseous abnormalities are identified. Mild multilevel vacuum phenomenon is noted along the thoracic spine. The visualized musculature is unremarkable in appearance. IMPRESSION: 1. Scattered nodules and associated tiny opacities within both lungs, more prominent on the right. Given that these are new from January, they are most likely infectious in nature. Would suggest Pulmonary consultation for further evaluation, to assess the  underlying infectious etiology. 2. Esophagus filled with fluid, raising question for esophageal dysmotility or gastroesophageal reflux. 3. Diffuse coronary artery calcifications seen. 4. Suggestion of mild hepatic cirrhosis. 5. Chronic severe bilateral renal atrophy. Electronically Signed   By: Jeffery  Chang M.D.   On: 10/14/2016 02:58    Impression/Plan: - Symptomatic anemia. HgB was 11.6 in September now 7.6. Iron study shows low iron saturation and ferritin of 9 consistent with iron deficiency anemia. - On and off black tarry stool after taking Alka-Seltzer. Occult blood negative - Epigastric pain. Need to rule out ulcer disease - Bright blood on the tissue paper and bowel movement - History of coronary artery disease status post CABG and multiple PCI. Currently on Plavix. Last dose was yesterday. No active chest pain.  - Abnormal CT scan showing multiple lung nodules  suspicious for infection. - ?? Early cirrhosis based on CT chest . Normal LFTs. Normal platelets. - Personal history of tubular adenoma during colonoscopy in 2012. Multiple no shows after that in GI clinic - Status post kidney transplant for end-stage renal disease. On Imuran. - COPD  Recommendation ----------------------- - Transfuse as needed. Plavix is currently on hold. Last dose yesterday. Continue PPI  - We will get ultrasound right upper quadrant to rule out liver cirrhosis. We'll get PT/INR/Hepatits panel  - Plan for EGD tomorrow. She will need propofol because of multiple medical comorbidities. -  She may need inpatient colonoscopy prior to discharge - Clear liquid diet for now. - Monitor hemoglobin. GI will follow. -    LOS: 0 days   Ninah Moccio  MD, FACP 10/14/2016, 10:12 AM  Pager [phone] If no answer or after 5 PM call [phone] 

## 2016-10-14 NOTE — Progress Notes (Signed)
Pharmacy Antibiotic Note  Katherine [name] is a 61 y.o. female admitted on 10/13/2016 with pneumonia.  Pharmacy has been consulted for Levaquin dosing.  Plan: Levaquin 750mg  IV Q24H.  Height: 5\' 1"  (154.9 cm) Weight: 179 lb 8 oz (81.4 kg) IBW/kg (Calculated) : 47.8  Temp (24hrs), Avg:98.5 F (36.9 C), Min:97.9 F (36.6 C), Max:99 F (37.2 C)   Recent Labs Lab 10/13/16 2104 10/14/16 0141  WBC 6.9 6.6  CREATININE 0.87 0.80    Estimated Creatinine Clearance: 73.2 mL/min (by C-G formula based on SCr of 0.8 mg/dL).    Allergies  Allergen Reactions  . Codeine Nausea And Vomiting  . Erythromycin Nausea And Vomiting  . Hydromorphone Hcl Nausea And Vomiting  . Morphine And Related Nausea And Vomiting  . Nalbuphine Nausea And Vomiting  . Niaspan [Niacin Er] Other (See Comments)    Mouth blisters  . Sulfonamide Derivatives Other (See Comments)    Reaction: per patient "tears her stomach up"  . Tetracycline Hives     Thank you for allowing pharmacy to be a part of this patient's care.  Wynona Neat, PharmD, BCPS  10/14/2016 3:31 AM

## 2016-10-14 NOTE — Progress Notes (Signed)
Patient with nausea and vomiting this afternoon zofran given as ordered as needed for nausea. Will monitor patient. Ginelle Bays, Bettina Gavia RN  Patient also with complaints of migraine wanting ice pack for head. This was given to patient as requested. Ismael Karge, Bettina Gavia rN

## 2016-10-14 NOTE — Progress Notes (Signed)
Nutrition Brief Note  Patient identified on the Malnutrition Screening Tool (MST) Report.  Per readings below, pt has had a 6% weight loss since December 2017; not significant for time frame.  Wt Readings from Last 15 Encounters:  10/14/16 179 lb 8 oz (81.4 kg)  07/17/16 190 lb (86.2 kg)  06/16/16 203 lb (92.1 kg)  03/10/16 202 lb (91.6 kg)  02/20/16 202 lb (91.6 kg)  02/05/16 198 lb (89.8 kg)  05/20/15 219 lb 12.8 oz (99.7 kg)  04/02/15 224 lb (101.6 kg)  10/29/14 222 lb (100.7 kg)  10/22/14 222 lb (100.7 kg)  08/01/14 192 lb 9.6 oz (87.4 kg)  07/28/14 185 lb (83.9 kg)  06/28/14 181 lb 14.1 oz (82.5 kg)  06/21/14 183 lb 1.6 oz (83.1 kg)  06/06/14 190 lb (86.2 kg)   Body mass index is 33.92 kg/m. Patient meets criteria for Obesity Class I based on current BMI.   Current diet order is NPO.  Labs and medications reviewed.   No nutrition interventions warranted at this time. If nutrition issues arise, please consult RD.   Arthur Holms, RD, LDN Pager #: [phone] After-Hours Pager #: [phone]

## 2016-10-14 NOTE — H&P (Signed)
History and Physical    [name] KKX:[id number] DOB: June 27, 1956 DOA: 10/13/2016  PCP: Placido Sou, MD  Patient coming from: Home.  Chief Complaint: Fatigue and weakness and cough.  HPI: Katherine [name] is a 61 y.o. female with renal transplant on immunosuppressants, diabetes mellitus, CAD status post CABG and stenting, LAP-BAND, asthma presents to the ER because of increasing fatigue over the last 1-2 weeks. Patient also has been having cough with mild sputum production over the last 2 weeks. Patient states she also has  epigastric discomfort off and on with diarrhea and black stools last 1 month. Patient states she has had EGD and colonoscopy done by Dr. Kalman Shan  5 years ago and is supposed to follow-up with Dr. Debbora Presto in a few weeks from now.  ED Course: Hemoglobin in the ER was found to be around 7.6 which is a drop of 4 g from previous in September 2017. Patient states she has been noticing black stools. Takes Alka-Seltzer which contains aspirin. Denies having taken any antibiotics recently. Patient's pain is mostly in epigastric area. Chest x-ray done shows multiple nodules etiology unclear. Patient denies any chest pain. Stool for occult blood is negative. Patient is being admitted for symptomatic anemia most likely from acute GI bleed in the setting of taking Alka-Seltzer which contains aspirin and patient also on Plavix for CAD.  Review of Systems: As per HPI, rest all negative.   Past Medical History:  Diagnosis Date  . Anxiety   . Bilateral carotid artery stenosis    Carotid duplex 08/6965: 8-93% LICA, 81-01% RICA, >75% RECA, f/u 1 yr suggested  . CAD (coronary artery disease) of bypass graft 5/01; 3/'02, 8/'03, 10/'04; 1/15   PCI x 5 to SVG-D1   . CAD in native artery 07/1993    3 Vessel Disease (LAD-D1 & RCA) -- CABG  . CAD S/P percutaneous coronary angioplasty    PCI to SVG-D1 insertion/native D1 x 4 = '01 -(S660 BMS 2.5 x 9 - insertion into D1; '02 -  distal overlap ACS Pixel 2.5 x 8  BMS; '03 distal/native ISR/Thrombosis - Pixel 2.5 x 13; '04 - ISR-  Taxus 2.5 x 20 (covered all);; 1/15 - mid SVG-D1 (50% distal ISR) - Promus P 2.75 x 20 -- 2.8 mm  . COPD mixed type ([place])    Followed by Dr. Lamonte Sakai "pulmonologist said no COPD"  . Depression   . Depression with anxiety   . Diabetes mellitus type 2 in obese ([place])   . Dyslipidemia, goal LDL below 70    08/2012: TC 137, TG 200, HDL 32!, LDL 45; on statin (followed by Dr.Deterding)  . Enlarged heart    "told one time"  . ESRD (end stage renal disease) ([place]) 1991   s/p Cadaveric Renal Transplant [facility] - Dr. Jimmy Footman)   . Fibromyalgia   . GERD (gastroesophageal reflux disease)   . Glomerulonephritis, chronic, rapidly progressive 5  . 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 07/1993   Rescue PTCA of RCA -- referred for CABG.  . H/O: GI bleed   . Hypertension associated with diabetes ([place])   . MRSA (methicillin resistant staph aureus) culture positive   . Obesity    s/p Lap Band 04/2004- Port Replacement 10/09 & 2/10 for infection; Dr. Excell Seltzer.  . OSA on CPAP    oxygen @night  @ 2L  . PAD (peripheral artery disease) ([place]) 08/2013   LEA Dopplers to be read by Dr. Fletcher Anon  . PAF (paroxysmal  atrial fibrillation) ([place])    Noted on CardioNet Monitor 11/5-18 - rate ~112  . Recurrent boils    Bilateral Groin  . Rheumatoid arthritis ([place])    Per Patient Report; associated with OA  . 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 Servando Snare: LIMA-LAD, SVG-bifurcatingD1, SVG-rPDA  . S/p cadaver renal transplant [place]  . Stroke [facility]) 2012   "right eye stroke- half blind now"  . Unstable angina ([place]) 5/01; 3/'02, 8/'03, 10/'04; 1/15   x 5 occurences since Inf-Post STEMI in 1994    Past Surgical History:  Procedure Laterality Date  . CARDIAC CATHETERIZATION  5/'01, 3/'02, 8/'03, 10/'04; 1/'15   08/22/2013: LAD & RCA 100%; LIMA-LAD & SVG-rPDA patent; Cx-- OM1 60%, OM2 ostial ~50%;  SVG-D1 - 80% mid, 50% distal ISR --PCI  . CARDIAC CATHETERIZATION  5/'01, 3/'02, 8/'03, 10/'04; 1/'15   08/22/2013: LAD & RCA 100%; LIMA-LAD & SVG-rPDA patent; Cx-- OM1 60%, OM2 ostial ~50%; SVG-D1 - 80% mid, 50% distal ISR --PCI  . CATHETER REMOVAL    . CHOLECYSTECTOMY N/A 10/29/2014   Procedure: LAPAROSCOPIC CHOLECYSTECTOMY WITH INTRAOPERATIVE CHOLANGIOGRAM;  Surgeon: Excell Seltzer, MD;  Location: [facility] ORS;  Service: General;  Laterality: N/A;  . CORONARY ANGIOPLASTY  1994   x5  . CORONARY ARTERY BYPASS GRAFT  1995   LIMA-LAD, SVG-RPDA, SVG-D1  . INCISE AND DRAIN ABCESS    . KIDNEY TRANSPLANT  1991  . LAPAROSCOPIC GASTRIC BANDING  04/2004; 10/'09, 2/'10   Port Replacement x 2  . LEFT HEART CATHETERIZATION WITH CORONARY/GRAFT ANGIOGRAM N/A 08/23/2013   Procedure: LEFT HEART CATHETERIZATION WITH Beatrix Fetters;  Surgeon: Wellington Hampshire, MD;  Location: [facility] CATH LAB;  Service: Cardiovascular;  Laterality: N/A;  . Lower Extremity Arterial Dopplers  08/2013   ABI: R 0.96, L 1.04  . MULTIPLE TOOTH EXTRACTIONS  age 33  . PERCUTANEOUS CORONARY STENT INTERVENTION (PCI-S)  5/'01, 3/'02, 8/'03, 10/'04;   '01 - S660 BMS 2.5 x 9 - dSVG-D1 into D1; '02- post-stent stenosis - 2.5 x 8 Pixel BMS; '8\03: ISR/Thrombosis into native D1 - AngioJet, 2.5 x 13 Pixel; '04 - ISR 95% - covered stented area with Taxus DES 2.5 mm x 20 (2.88)  . PERCUTANEOUS CORONARY STENT INTERVENTION (PCI-S)  08/22/2013   mid SVG-D1 80%; distal stent ~50% ISR; Promus Prermier DES 2.75 mm xc 20 mm (2.8 mm)  . PERCUTANEOUS CORONARY STENT INTERVENTION (PCI-S)  08/23/2013   Procedure: PERCUTANEOUS CORONARY STENT INTERVENTION (PCI-S);  Surgeon: Wellington Hampshire, MD;  Location: [facility] CATH LAB;  Service: Cardiovascular;;  . PORT-A-CATH REMOVAL     kidney  . TRANSTHORACIC ECHOCARDIOGRAM  02/2013   NL LV Size, EF 55-60%; MAC with Mild MR.  . TUBAL LIGATION    . wrist fistula repair Left    dialysis for one year     reports that she quit  smoking about 14 years ago. Her smoking use included Cigarettes. She has a 30.00 pack-year smoking history. She has never used smokeless tobacco. She reports that she does not drink alcohol or use drugs.  Allergies  Allergen Reactions  . Codeine Nausea And Vomiting  . Erythromycin Nausea And Vomiting  . Hydromorphone Hcl Nausea And Vomiting  . Morphine And Related Nausea And Vomiting  . Nalbuphine Nausea And Vomiting  . Niaspan [Niacin Er] Other (See Comments)    Mouth blisters  . Sulfonamide Derivatives Other (See Comments)    Reaction: per patient "tears her stomach up"  . Tetracycline Hives  Family History  Problem Relation Age of Onset  . Cancer Mother     liver  . Heart disease Father   . Cancer Father     colon  . Arrhythmia Brother     Atrial Fibrillation  . Arrhythmia Paternal Aunt     Atrial Fibrillation    Prior to Admission medications   Medication Sig Start Date End Date Taking? Authorizing Provider  azaTHIOprine (IMURAN) 50 MG tablet Take 125 mg by mouth every evening.    Yes Historical Provider, MD  budesonide-formoterol (SYMBICORT) 160-4.5 MCG/ACT inhaler Inhale 2 puffs into the lungs 2 (two) times daily. 06/16/16 07/17/17 Yes Collene Gobble, MD  calcitRIOL (ROCALTROL) 0.25 MCG capsule Take 0.25 mcg by mouth every morning.    Yes Historical Provider, MD  carbamazepine (TEGRETOL) 200 MG tablet Take 200 mg three x daily. Patient taking differently: Take 200 mg by mouth 2 (two) times daily.  05/19/16  Yes Meredith Staggers, MD  clopidogrel (PLAVIX) 75 MG tablet TAKE 1 TABLET (75 MG TOTAL) BY MOUTH DAILY. 02/11/16  Yes [name], MD  cyclobenzaprine (FLEXERIL) 10 MG tablet Take 10 mg by mouth 2 (two) times daily.    Yes Historical Provider, MD  diltiazem (CARDIZEM CD) 120 MG 24 hr capsule Take 1 capsule (120 mg total) by mouth daily. NEED OV. 08/13/16  Yes [name], MD  diphenhydramine-acetaminophen (TYLENOL PM) 25-500 MG TABS tablet Take 2 tablets by  mouth at bedtime as needed (for sleep).   Yes Historical Provider, MD  DULoxetine (CYMBALTA) 30 MG capsule TAKE 2 CAPSULES (60 MG TOTAL) BY MOUTH EVERY MORNING. 09/29/16  Yes Meredith Staggers, MD  fluticasone [facility]) 50 MCG/ACT nasal spray PLACE 2 SPRAYS INTO THE NOSE DAILY 08/28/16  Yes Collene Gobble, MD  glimepiride (AMARYL) 4 MG tablet Take 4 mg by mouth every morning. 04/20/16  Yes Historical Provider, MD  LORazepam (ATIVAN) 1 MG tablet Take 1 mg by mouth every morning.    Yes Historical Provider, MD  metFORMIN (GLUCOPHAGE) 500 MG tablet Take 500 mg by mouth 2 (two) times daily. 10/03/16  Yes Historical Provider, MD  metoprolol tartrate (LOPRESSOR) 25 MG tablet Take half tablet (12.5 mg total) by mouth daily. Take whole tablet (25 mg total) by mouth daily if SBP > 150. Patient taking differently: Take 12.5-25 mg by mouth daily as needed (for BP). Take half tablet (12.5 mg total) by mouth daily. Take whole tablet (25 mg total) by mouth daily if SBP > 150. 02/20/16  Yes Eileen Stanford, PA-C  montelukast (SINGULAIR) 10 MG tablet TAKE 1 TABLET (10 MG TOTAL) BY MOUTH DAILY. 07/06/16  Yes Collene Gobble, MD  omeprazole (PRILOSEC) 40 MG capsule Take 40 mg by mouth every morning.    Yes Historical Provider, MD  oxyCODONE (OXY IR/ROXICODONE) 5 MG immediate release tablet Take 1 tablet (5 mg total) by mouth every 8 (eight) hours as needed for severe pain. 05/19/16  Yes Meredith Staggers, MD  predniSONE (DELTASONE) 5 MG tablet Take 5 mg by mouth every morning.    Yes Historical Provider, MD  promethazine (PHENERGAN) 25 MG tablet Take 25 mg by mouth at bedtime as needed for nausea or vomiting. For nausea and sleep   Yes Historical Provider, MD  rosuvastatin (CRESTOR) 20 MG tablet TAKE 1 TABLET (20 MG TOTAL) BY MOUTH DAILY. 06/29/16  Yes [name], MD  sertraline (ZOLOFT) 100 MG tablet Take 100 mg by mouth every morning.   Yes  Historical Provider, MD  VENTOLIN HFA 108 (90 Base) MCG/ACT inhaler INHALE 2  PUFFS INTO THE LUNGS EVERY 6 (SIX) HOURS AS NEEDED FOR WHEEZING OR SHORTNESS OF BREATH. 10/13/16  Yes Collene Gobble, MD  acetaminophen (TYLENOL) 500 MG tablet Take 1,500 mg by mouth every 6 (six) hours as needed for moderate pain or headache.    Historical Provider, MD  nitroGLYCERIN (NITROSTAT) 0.4 MG SL tablet Place 1 tablet (0.4 mg total) under the tongue every 5 (five) minutes as needed for chest pain. 09/11/13   [name], MD    Physical Exam: Vitals:   10/13/16 2115 10/13/16 2130 10/13/16 2315 10/13/16 2358  BP: 123/64 134/69 147/70 (!) 158/74  Pulse: 69 77 74 82  Resp:   20 20  Temp:    97.9 F (36.6 C)  TempSrc:    Oral  SpO2: 99% 100% 99% 99%  Weight:    82.2 kg (181 lb 3.2 oz)  Height:    5' 1"  (1.549 m)      Constitutional: Moderately built and nourished. Vitals:   10/13/16 2115 10/13/16 2130 10/13/16 2315 10/13/16 2358  BP: 123/64 134/69 147/70 (!) 158/74  Pulse: 69 77 74 82  Resp:   20 20  Temp:    97.9 F (36.6 C)  TempSrc:    Oral  SpO2: 99% 100% 99% 99%  Weight:    82.2 kg (181 lb 3.2 oz)  Height:    5' 1"  (1.549 m)   Eyes: Anicteric mild pallor. ENMT: No discharge from the ears eyes nose and mouth. Neck: No mass felt. No neck rigidity. Respiratory: No rhonchi or crepitations. Cardiovascular: S1 and S2 heard no murmurs appreciated. Abdomen: Soft nontender bowel sounds present. Musculoskeletal: No edema. No joint effusion. Skin: No rash. Skin appears warm. Neurologic: Alert awake oriented to time place and person. Moves all extremities. Psychiatric: Appears normal. Normal affect.   Labs on Admission: I have personally reviewed following labs and imaging studies  CBC:  Recent Labs Lab 10/13/16 2104  WBC 6.9  HGB 7.6*  HCT 25.3*  MCV 85.8  PLT 967   Basic Metabolic Panel:  Recent Labs Lab 10/13/16 2104  NA 138  K 3.5  CL 101  CO2 24  GLUCOSE 121*  BUN 11  CREATININE 0.87  CALCIUM 9.4   GFR: Estimated Creatinine Clearance:  67.7 mL/min (by C-G formula based on SCr of 0.87 mg/dL). Liver Function Tests:  Recent Labs Lab 10/13/16 2104  AST 22  ALT 10*  ALKPHOS 112  BILITOT 0.1*  PROT 6.6  ALBUMIN 3.0*    Recent Labs Lab 10/13/16 2104  LIPASE 19   No results for input(s): AMMONIA in the last 168 hours. Coagulation Profile: No results for input(s): INR, PROTIME in the last 168 hours. Cardiac Enzymes: No results for input(s): CKTOTAL, CKMB, CKMBINDEX, TROPONINI in the last 168 hours. BNP (last 3 results)  Recent Labs  02/07/16 1224  PROBNP 139.0*   HbA1C: No results for input(s): HGBA1C in the last 72 hours. CBG: No results for input(s): GLUCAP in the last 168 hours. Lipid Profile: No results for input(s): CHOL, HDL, LDLCALC, TRIG, CHOLHDL, LDLDIRECT in the last 72 hours. Thyroid Function Tests: No results for input(s): TSH, T4TOTAL, FREET4, T3FREE, THYROIDAB in the last 72 hours. Anemia Panel: No results for input(s): VITAMINB12, FOLATE, FERRITIN, TIBC, IRON, RETICCTPCT in the last 72 hours. Urine analysis:    Component Value Date/Time   COLORURINE AMBER (A) 10/13/2016 [place]  10/13/2016 2240   LABSPEC 1.018 10/13/2016 2240   PHURINE 5.0 10/13/2016 2240   GLUCOSEU NEGATIVE 10/13/2016 2240   HGBUR NEGATIVE 10/13/2016 2240   BILIRUBINUR NEGATIVE 10/13/2016 2240   KETONESUR 5 (A) 10/13/2016 2240   PROTEINUR NEGATIVE 10/13/2016 2240   UROBILINOGEN 0.2 10/16/2013 0110   NITRITE NEGATIVE 10/13/2016 2240   LEUKOCYTESUR NEGATIVE 10/13/2016 2240   Sepsis Labs: @LABRCNTIP (procalcitonin:4,lacticidven:4) )No results found for this or any previous visit (from the past 240 hour(s)).   Radiological Exams on Admission: Dg Chest 2 View  Result Date: 10/13/2016 CLINICAL DATA:  Palpitations and shortness of breath. EXAM: CHEST  2 VIEW COMPARISON:  08/31/2016 FINDINGS: Post median sternotomy and CABG. Unchanged heart size and mediastinal contours, heart upper limits of normal.  Probable hiatal hernia. Development of multifocal ill-defined opacities within both lungs, right greater than left, some of which appear nodular. Streaky scarring in the left mid lung. Gastric band is faintly visualized. No acute osseous abnormalities are seen. IMPRESSION: Multifocal opacities in both lungs that appear nodule or, right greater than left. These appear new from exam 6 weeks prior. This may be infectious or inflammatory, however CT characterization may be helpful. Electronically Signed   By: Jeb Levering M.D.   On: 10/13/2016 20:26    EKG: Independently reviewed. Normal sinus rhythm.  Assessment/Plan Principal Problem:   Symptomatic anemia Active Problems:   Sleep apnea   Morbid obesity - s/p Lap Band 9/'05   S/P CABG (coronary artery bypass graft) x 3   ST elevation myocardial infarction (STEMI) of inferoposterior wall; H/o -- Rescue PTCA of RCA -- referred for CABG   CAD S/P percutaneous coronary angioplasty - PCI x 5 to SVG-D1   Hypertension associated with diabetes ([place])   PAD (peripheral artery disease) ([facility])   PAF (paroxysmal atrial fibrillation) ([facility])   Acute GI bleeding   Lung nodules   Anemia    1. Symptomatic anemia most likely from acute GI bleeding - patient takes Alka-Seltzer which contains aspirin. Patient is also on Plavix. Since patient has epigastric discomfort and black stools patient most likely has upper GI bleed. Stool for occult blood in the ER was negative. I have placed patient on Protonix infusion and kept nothing by mouth except medications. Will check serial CBC and check anemia panel. Please consult GI in a.m. Patient is known to Dr. Kalman Shan. 2. Lung nodules - discussed with Dr. Halford Chessman, pulmonologist. Will get CT chest and based on which we will have further plans. Until then we'll keep patient on empiric antibiotics for pneumonia. Check HIV status urine for Legionella and strep antigen. Follow blood cultures. 3. CAD status post CABG and stenting -  denies any chest pain. However patient has fatigue. Will check troponin. EKG unremarkable. Plavix on hold due to possible GI bleed. Patient on statins and beta blocker which will be continued. 4. Renal transplant - on immunosuppressant which will be continued. I have placed patient on IV Solu-Medrol for now since it will help her stress dose. 5. Diabetes mellitus type 2 - will hold Amaryl and metformin while inpatient and keep patient on sliding scale coverage. 6. History of asthma - presently not actively wheezing continue home inhalers.   DVT prophylaxis: SCDs.  Code Status: Full code.  Family Communication: Discussed with patient.  Disposition Plan: Home.  Consults called: None.  Admission status: Observation.    Rise Patience MD [facility] Pager [phone].  If 7PM-7AM, please contact night-coverage [email or web address] Password [facility]  10/14/2016, 12:33 AM

## 2016-10-15 ENCOUNTER — Encounter (HOSPITAL_COMMUNITY)
Admission: EM | Disposition: A | Discharge status: Home / Self Care | Payer: Self-pay | Source: Home / Self Care | Attending: Internal Medicine

## 2016-10-15 ENCOUNTER — Inpatient Hospital Stay (HOSPITAL_COMMUNITY): Payer: Medicare Other | Admitting: Certified Registered Nurse Anesthetist

## 2016-10-15 ENCOUNTER — Encounter (HOSPITAL_COMMUNITY): Payer: Self-pay | Admitting: *Deleted

## 2016-10-15 DIAGNOSIS — K922 Gastrointestinal hemorrhage, unspecified: Secondary | ICD-10-CM

## 2016-10-15 DIAGNOSIS — I480 Paroxysmal atrial fibrillation: Secondary | ICD-10-CM

## 2016-10-15 DIAGNOSIS — I10 Essential (primary) hypertension: Secondary | ICD-10-CM

## 2016-10-15 DIAGNOSIS — E1159 Type 2 diabetes mellitus with other circulatory complications: Secondary | ICD-10-CM

## 2016-10-15 HISTORY — PX: ESOPHAGOGASTRODUODENOSCOPY: SHX5428

## 2016-10-15 LAB — TYPE AND SCREEN
ABO/RH(D): O POS
ANTIBODY SCREEN: NEGATIVE
Unit division: 0

## 2016-10-15 LAB — HEPATITIS PANEL, ACUTE
HCV AB: 0.1 {s_co_ratio} (ref 0.0–0.9)
HEP A IGM: NEGATIVE
HEP B C IGM: NEGATIVE
Hepatitis B Surface Ag: NEGATIVE

## 2016-10-15 LAB — LEGIONELLA PNEUMOPHILA SEROGP 1 UR AG: L. PNEUMOPHILA SEROGP 1 UR AG: NEGATIVE

## 2016-10-15 LAB — GLUCOSE, CAPILLARY
GLUCOSE-CAPILLARY: 102 mg/dL — AB (ref 65–99)
GLUCOSE-CAPILLARY: 101 mg/dL — AB (ref 65–99)
Glucose-Capillary: 112 mg/dL — ABNORMAL HIGH (ref 65–99)
Glucose-Capillary: 113 mg/dL — ABNORMAL HIGH (ref 65–99)
Glucose-Capillary: 92 mg/dL (ref 65–99)

## 2016-10-15 LAB — CBC
HCT: 28.5 % — ABNORMAL LOW (ref 36.0–46.0)
Hemoglobin: 8.8 g/dL — ABNORMAL LOW (ref 12.0–15.0)
MCH: 26.3 pg (ref 26.0–34.0)
MCHC: 30.9 g/dL (ref 30.0–36.0)
MCV: 85.1 fL (ref 78.0–100.0)
PLATELETS: 316 10*3/uL (ref 150–400)
RBC: 3.35 MIL/uL — ABNORMAL LOW (ref 3.87–5.11)
RDW: 16.9 % — ABNORMAL HIGH (ref 11.5–15.5)
WBC: 6 10*3/uL (ref 4.0–10.5)

## 2016-10-15 LAB — BPAM RBC
Blood Product Expiration Date: 201803222359
ISSUE DATE / TIME: 201802281309
Unit Type and Rh: 5100

## 2016-10-15 SURGERY — EGD (ESOPHAGOGASTRODUODENOSCOPY)
Anesthesia: Monitor Anesthesia Care

## 2016-10-15 MED ORDER — FLUCONAZOLE IN SODIUM CHLORIDE 200-0.9 MG/100ML-% IV SOLN
200.0000 mg | INTRAVENOUS | Status: DC
Start: 2016-10-15 — End: 2016-10-17
  Administered 2016-10-15 – 2016-10-17 (×3): 200 mg via INTRAVENOUS
  Filled 2016-10-15 (×3): qty 100

## 2016-10-15 MED ORDER — DIPHENHYDRAMINE HCL 25 MG PO CAPS
25.0000 mg | ORAL_CAPSULE | Freq: Once | ORAL | Status: AC
  Administered 2016-10-15: 25 mg via ORAL
  Filled 2016-10-15: qty 1

## 2016-10-15 MED ORDER — PANTOPRAZOLE SODIUM 40 MG IV SOLR
40.0000 mg | Freq: Two times a day (BID) | INTRAVENOUS | Status: DC
  Administered 2016-10-15 – 2016-10-17 (×5): 40 mg via INTRAVENOUS
  Filled 2016-10-15 (×4): qty 40

## 2016-10-15 MED ORDER — PREDNISONE 5 MG PO TABS
5.0000 mg | ORAL_TABLET | Freq: Every day | ORAL | Status: DC
  Administered 2016-10-16 – 2016-10-17 (×2): 5 mg via ORAL
  Filled 2016-10-15 (×2): qty 1

## 2016-10-15 MED ORDER — LACTATED RINGERS IV SOLN
INTRAVENOUS | Status: DC
  Administered 2016-10-15: 08:00:00 via INTRAVENOUS

## 2016-10-15 MED ORDER — SODIUM CHLORIDE 0.9 % IV SOLN: INTRAVENOUS | Status: DC

## 2016-10-15 MED ORDER — LIDOCAINE 2% (20 MG/ML) 5 ML SYRINGE
INTRAMUSCULAR | Status: DC | PRN
  Administered 2016-10-15: 80 mg via INTRAVENOUS

## 2016-10-15 MED ORDER — PROPOFOL 500 MG/50ML IV EMUL
INTRAVENOUS | Status: DC | PRN
  Administered 2016-10-15: 100 ug/kg/min via INTRAVENOUS

## 2016-10-15 MED ORDER — PEG 3350-KCL-NA BICARB-NACL 420 G PO SOLR
4000.0000 mL | Freq: Once | ORAL | Status: AC
  Administered 2016-10-15: 4000 mL via ORAL
  Filled 2016-10-15: qty 4000

## 2016-10-15 MED ORDER — PROPOFOL 10 MG/ML IV BOLUS
INTRAVENOUS | Status: DC | PRN
  Administered 2016-10-15: 50 mg via INTRAVENOUS
  Administered 2016-10-15: 20 mg via INTRAVENOUS

## 2016-10-15 NOTE — Interval H&P Note (Signed)
History and Physical Interval Note:  10/15/2016 8:43 AM  Katherine [name]  has presented today for surgery, with the diagnosis of IDA. Black stools.  The various methods of treatment have been discussed with the patient and family. After consideration of risks, benefits and other options for treatment, the patient has consented to  Procedure(s): ESOPHAGOGASTRODUODENOSCOPY (EGD) (N/A) as a surgical intervention .  The patient's history has been reviewed, patient examined, no change in status, stable for surgery.  I have reviewed the patient's chart and labs.  Questions were answered to the patient's satisfaction.     [place] C.

## 2016-10-15 NOTE — H&P (View-Only) (Signed)
Referring Provider: Dr. Cathlean Sauer  Primary Care Physician:  Placido Sou, MD Primary Gastroenterologist:  Dr. [name]  Reason for Consultation:    HPI: Katherine [name] is a 61 y.o. female with past medical history of coronary artery disease currently on Plavix, history of gastric lap band in 2005, history of paroxysmal atrial fibrillation, history of renal transplant on immunosuppression presented to the hospital with fatigue and weakness. Upon initial evaluation patient was found to hemoglobin of 7.6. Normal LFTs. Normal platelets. Occult blood is negative. Ferritin of 9 consistent with iron deficiency anemia. Also has low iron saturation. CT chest was performed because of chest x-ray showing pulmonary nodules which revealed scattered nodules suspicious for infectious condition. Also showed possible mild hepatic cirrhosis based on mild nodular contour of the liver.  Patient seen and examined. Complaining of epigastric pain which started around 3-4 weeks ago. Patient started taking Alka-Seltzer for that. Subsequently patient started noticing diarrhea with black tarry stool. 2-3 bowel movements per day. Occasionally has normal stools in between. Last bowel movement was yesterday. Complaining of occasional nausea and vomiting since lap band surgery. Denied any blood in the vomiting. Has noted some bright blood on the tissue paper 2 days ago. Last dose of Plavix was yesterday morning. Denied dysphagia or odynophagia but complaining of acid reflux. Denied active chest pain. has baseline shortness of breath. Complaining of fatigue and weakness  Previous GI work up  --------------------------- EGD - 12/2010 - Normal  Colonoscopy 12/2010 - Small Tubular adenoma . Repeat was recommended in 5 years  Patient had multiple no show after that  Capsule Endoscopy 2007 - Limited study /poor prep/ no active bleeding seen  Last blood work in our EHR is from 2007   Past Medical History:  Diagnosis Date  .  Anxiety   . Bilateral carotid artery stenosis    Carotid duplex 09/5636: 9-37% LICA, 34-28% RICA, >76% RECA, f/u 1 yr suggested  . CAD (coronary artery disease) of bypass graft 5/01; 3/'02, 8/'03, 10/'04; 1/15   PCI x 5 to SVG-D1   . CAD in native artery 07/1993    3 Vessel Disease (LAD-D1 & RCA) -- CABG  . CAD S/P percutaneous coronary angioplasty    PCI to SVG-D1 insertion/native D1 x 4 = '01 -(S660 BMS 2.5 x 9 - insertion into D1; '02 - distal overlap ACS Pixel 2.5 x 8  BMS; '03 distal/native ISR/Thrombosis - Pixel 2.5 x 13; '04 - ISR-  Taxus 2.5 x 20 (covered all);; 1/15 - mid SVG-D1 (50% distal ISR) - Promus P 2.75 x 20 -- 2.8 mm  . COPD mixed type ([place])    Followed by Dr. Lamonte Sakai "pulmonologist said no COPD"  . Depression   . Depression with anxiety   . Diabetes mellitus type 2 in obese ([place])   . Dyslipidemia, goal LDL below 70    08/2012: TC 137, TG 200, HDL 32!, LDL 45; on statin (followed by Dr.Deterding)  . Enlarged heart    "told one time"  . ESRD (end stage renal disease) ([place]) 1991   s/p Cadaveric Renal Transplant [facility] - Dr. Jimmy Footman)   . Fibromyalgia   . GERD (gastroesophageal reflux disease)   . Glomerulonephritis, chronic, rapidly progressive 55  . 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 07/1993   Rescue PTCA of RCA -- referred for CABG.  . H/O: GI bleed   . Hypertension associated with diabetes ([place])   . MRSA (methicillin resistant staph aureus) culture positive   .  Obesity    s/p Lap Band 04/2004- Port Replacement 10/09 & 2/10 for infection; Dr. Excell Seltzer.  . OSA on CPAP    oxygen _0  @ 2L  . PAD (peripheral artery disease) ([place]) 08/2013   LEA Dopplers to be read by Dr. Fletcher Anon  . PAF (paroxysmal atrial fibrillation) ([place])    Noted on CardioNet Monitor 11/5-18 - rate ~112  . Recurrent boils    Bilateral Groin  . Rheumatoid arthritis ([place])    Per Patient Report; associated with OA  . 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 Servando Snare: LIMA-LAD,  SVG-bifurcatingD1, SVG-rPDA  . S/p cadaver renal transplant [place]  . Stroke [facility]) 2012   "right eye stroke- half blind now"  . Unstable angina ([place]) 5/01; 3/'02, 8/'03, 10/'04; 1/15   x 5 occurences since Inf-Post STEMI in 1994    Past Surgical History:  Procedure Laterality Date  . CARDIAC CATHETERIZATION  5/'01, 3/'02, 8/'03, 10/'04; 1/'15   08/22/2013: LAD & RCA 100%; LIMA-LAD & SVG-rPDA patent; Cx-- OM1 60%, OM2 ostial ~50%; SVG-D1 - 80% mid, 50% distal ISR --PCI  . CARDIAC CATHETERIZATION  5/'01, 3/'02, 8/'03, 10/'04; 1/'15   08/22/2013: LAD & RCA 100%; LIMA-LAD & SVG-rPDA patent; Cx-- OM1 60%, OM2 ostial ~50%; SVG-D1 - 80% mid, 50% distal ISR --PCI  . CATHETER REMOVAL    . CHOLECYSTECTOMY N/A 10/29/2014   Procedure: LAPAROSCOPIC CHOLECYSTECTOMY WITH INTRAOPERATIVE CHOLANGIOGRAM;  Surgeon: Excell Seltzer, MD;  Location: [facility] ORS;  Service: General;  Laterality: N/A;  . CORONARY ANGIOPLASTY  1994   x5  . CORONARY ARTERY BYPASS GRAFT  1995   LIMA-LAD, SVG-RPDA, SVG-D1  . INCISE AND DRAIN ABCESS    . KIDNEY TRANSPLANT  1991  . LAPAROSCOPIC GASTRIC BANDING  04/2004; 10/'09, 2/'10   Port Replacement x 2  . LEFT HEART CATHETERIZATION WITH CORONARY/GRAFT ANGIOGRAM N/A 08/23/2013   Procedure: LEFT HEART CATHETERIZATION WITH Beatrix Fetters;  Surgeon: Wellington Hampshire, MD;  Location: [place] CATH LAB;  Service: Cardiovascular;  Laterality: N/A;  . Lower Extremity Arterial Dopplers  08/2013   ABI: R 0.96, L 1.04  . MULTIPLE TOOTH EXTRACTIONS  age 27  . PERCUTANEOUS CORONARY STENT INTERVENTION (PCI-S)  5/'01, 3/'02, 8/'03, 10/'04;   '01 - S660 BMS 2.5 x 9 - dSVG-D1 into D1; '02- post-stent stenosis - 2.5 x 8 Pixel BMS; '8\03: ISR/Thrombosis into native D1 - AngioJet, 2.5 x 13 Pixel; '04 - ISR 95% - covered stented area with Taxus DES 2.5 mm x 20 (2.88)  . PERCUTANEOUS CORONARY STENT INTERVENTION (PCI-S)  08/22/2013   mid SVG-D1 80%; distal stent ~50% ISR; Promus Prermier DES 2.75 mm xc 20  mm (2.8 mm)  . PERCUTANEOUS CORONARY STENT INTERVENTION (PCI-S)  08/23/2013   Procedure: PERCUTANEOUS CORONARY STENT INTERVENTION (PCI-S);  Surgeon: Wellington Hampshire, MD;  Location: [facility] C[facility] LAB;  Service: Cardiovascular;;  . PORT-A-CATH REMOVAL     kidney  . TRANSTHORACIC ECHOCARDIOGRAM  02/2013   NL LV Size, EF 55-60%; MAC with Mild MR.  . TUBAL LIGATION    . wrist fistula repair Left    dialysis for one year    Prior to Admission medications   Medication Sig Start Date End Date Taking? Authorizing Provider  azaTHIOprine (IMURAN) 50 MG tablet Take 125 mg by mouth every evening.    Yes Historical Provider, MD  budesonide-formoterol (SYMBICORT) 160-4.5 MCG/ACT inhaler Inhale 2 puffs into the lungs 2 (two) times daily. 06/16/16 07/17/17 Yes Collene Gobble, MD  calcitRIOL (ROCALTROL)  0.25 MCG capsule Take 0.25 mcg by mouth every morning.    Yes Historical Provider, MD  carbamazepine (TEGRETOL) 200 MG tablet Take 200 mg three x daily. Patient taking differently: Take 200 mg by mouth 2 (two) times daily.  05/19/16  Yes Meredith Staggers, MD  clopidogrel (PLAVIX) 75 MG tablet TAKE 1 TABLET (75 MG TOTAL) BY MOUTH DAILY. 02/11/16  Yes [name], MD  cyclobenzaprine (FLEXERIL) 10 MG tablet Take 10 mg by mouth 2 (two) times daily.    Yes Historical Provider, MD  diltiazem (CARDIZEM CD) 120 MG 24 hr capsule Take 1 capsule (120 mg total) by mouth daily. NEED OV. 08/13/16  Yes [name], MD  diphenhydramine-acetaminophen (TYLENOL PM) 25-500 MG TABS tablet Take 2 tablets by mouth at bedtime as needed (for sleep).   Yes Historical Provider, MD  DULoxetine (CYMBALTA) 30 MG capsule TAKE 2 CAPSULES (60 MG TOTAL) BY MOUTH EVERY MORNING. 09/29/16  Yes Meredith Staggers, MD  fluticasone [facility]) 50 MCG/ACT nasal spray PLACE 2 SPRAYS INTO THE NOSE DAILY 08/28/16  Yes Collene Gobble, MD  glimepiride (AMARYL) 4 MG tablet Take 4 mg by mouth every morning. 04/20/16  Yes Historical Provider, MD  LORazepam (ATIVAN) 1  MG tablet Take 1 mg by mouth every morning.    Yes Historical Provider, MD  metFORMIN (GLUCOPHAGE) 500 MG tablet Take 500 mg by mouth 2 (two) times daily. 10/03/16  Yes Historical Provider, MD  metoprolol tartrate (LOPRESSOR) 25 MG tablet Take half tablet (12.5 mg total) by mouth daily. Take whole tablet (25 mg total) by mouth daily if SBP > 150. Patient taking differently: Take 12.5-25 mg by mouth daily as needed (for BP). Take half tablet (12.5 mg total) by mouth daily. Take whole tablet (25 mg total) by mouth daily if SBP > 150. 02/20/16  Yes Eileen Stanford, PA-C  montelukast (SINGULAIR) 10 MG tablet TAKE 1 TABLET (10 MG TOTAL) BY MOUTH DAILY. 07/06/16  Yes Collene Gobble, MD  omeprazole (PRILOSEC) 40 MG capsule Take 40 mg by mouth every morning.    Yes Historical Provider, MD  oxyCODONE (OXY IR/ROXICODONE) 5 MG immediate release tablet Take 1 tablet (5 mg total) by mouth every 8 (eight) hours as needed for severe pain. 05/19/16  Yes Meredith Staggers, MD  predniSONE (DELTASONE) 5 MG tablet Take 5 mg by mouth every morning.    Yes Historical Provider, MD  promethazine (PHENERGAN) 25 MG tablet Take 25 mg by mouth at bedtime as needed for nausea or vomiting. For nausea and sleep   Yes Historical Provider, MD  rosuvastatin (CRESTOR) 20 MG tablet TAKE 1 TABLET (20 MG TOTAL) BY MOUTH DAILY. 06/29/16  Yes [name], MD  sertraline (ZOLOFT) 100 MG tablet Take 100 mg by mouth every morning.   Yes Historical Provider, MD  VENTOLIN HFA 108 (90 Base) MCG/ACT inhaler INHALE 2 PUFFS INTO THE LUNGS EVERY 6 (SIX) HOURS AS NEEDED FOR WHEEZING OR SHORTNESS OF BREATH. 10/13/16  Yes Collene Gobble, MD  acetaminophen (TYLENOL) 500 MG tablet Take 1,500 mg by mouth every 6 (six) hours as needed for moderate pain or headache.    Historical Provider, MD  nitroGLYCERIN (NITROSTAT) 0.4 MG SL tablet Place 1 tablet (0.4 mg total) under the tongue every 5 (five) minutes as needed for chest pain. 09/11/13   [name], MD     Scheduled Meds: . sodium chloride   Intravenous Once  . azaTHIOprine  125 mg Oral QPM  .  calcitRIOL  0.25 mcg Oral q morning - 10a  . carbamazepine  200 mg Oral BID  . cyclobenzaprine  10 mg Oral BID  . diltiazem  120 mg Oral Daily  . DULoxetine  60 mg Oral q morning - 10a  . fluticasone  2 spray Each Nare Daily  . insulin aspart  0-9 Units Subcutaneous Q4H  . [START ON 10/15/2016] levofloxacin (LEVAQUIN) IV  500 mg Intravenous Q24H  . LORazepam  1 mg Oral q morning - 10a  . mometasone-formoterol  2 puff Inhalation BID  . montelukast  10 mg Oral QHS  . [START ON 10/17/2016] pantoprazole  40 mg Intravenous Q12H  . rosuvastatin  20 mg Oral q1800  . sertraline  100 mg Oral q morning - 10a   Continuous Infusions: . pantoprozole (PROTONIX) infusion 8 mg/hr (10/14/16 0204)   PRN Meds:.acetaminophen **OR** acetaminophen, albuterol, nitroGLYCERIN, ondansetron **OR** ondansetron (ZOFRAN) IV, oxyCODONE, promethazine  Allergies as of 10/13/2016 - Review Complete 10/13/2016  Allergen Reaction Noted  . Codeine Nausea And Vomiting   . Erythromycin Nausea And Vomiting   . Hydromorphone hcl Nausea And Vomiting   . Morphine and related Nausea And Vomiting 10/19/2014  . Nalbuphine Nausea And Vomiting   . Niaspan [niacin er] Other (See Comments) 10/08/2012  . Sulfonamide derivatives Other (See Comments)   . Tetracycline Hives     Family History  Problem Relation Age of Onset  . Cancer Mother     liver  . Heart disease Father   . Cancer Father     colon  . Arrhythmia Brother     Atrial Fibrillation  . Arrhythmia Paternal Aunt     Atrial Fibrillation    Social History   Social History  . Marital status: Married    Spouse name: N/A  . Number of children: N/A  . Years of education: N/A   Occupational History  . Not on file.   Social History Main Topics  . Smoking status: Former Smoker    Packs/day: 1.00    Years: 30.00    Types: Cigarettes    Quit date: 08/17/2002  .  Smokeless tobacco: Never Used  . Alcohol use No  . Drug use: No  . Sexual activity: Not on file   Other Topics Concern  . 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 Prior to his diagnosis of her husband, she actually had adopted a 40-year-old child who she knows caring for as well. With all the surgeries, they have been quite financially troubled. Thanks the help of her community and church, they have been able to stay "alfoat."     She is a former smoker who quit in 2004 after a 30-pack-year history.   She is active chasing a 58-year-old child, does not do routine exercise. She's been quite depressed with the condition of her husband, and admits to eating comfort herself.   She does not drink alcohol.    Review of Systems: All negative except as stated above in HPI. Review of Systems  Constitutional: Positive for malaise/fatigue.  HENT: Negative for ear discharge, hearing loss and nosebleeds.   Eyes: Negative for double vision and photophobia.  Respiratory: Positive for cough and shortness of breath.   Cardiovascular: Negative for chest pain and palpitations.  Gastrointestinal: Positive for abdominal pain, blood in stool, heartburn, melena, nausea and vomiting.  Genitourinary: Negative for dysuria and urgency.  Musculoskeletal: Positive for back pain and joint pain.  Skin: Negative for rash.  Neurological: Positive for dizziness and weakness. Negative for sensory change and headaches.  Endo/Heme/Allergies: Bruises/bleeds easily.  Psychiatric/Behavioral: Negative for hallucinations and suicidal ideas.    Physical Exam: Vital signs: Vitals:   10/13/16 2315 10/13/16 2358  BP: 147/70 (!) 158/74  Pulse: 74 82  Resp: 20 20  Temp:  97.9 F (36.6 C)   Last BM Date: 10/13/16 General:    Well-developed, well-nourished, pleasant and cooperative in  NAD HEENT : NS, AT , EOMI.  No scleral icterus Lungs:  Fine bibasilar rhonchi. No acute distress. Heart:  Regular rate and rhythm; murmur noted Abdomen: soft, epigastric tenderness palpation, nondistended, bowel sounds present. No peritoneal signs. No rebound LE : no edema  Psych : alert oriented 3. Mood and affect normal. Rectal:  Deferred  GI:  Lab Results:  Recent Labs  10/14/16 0141 10/14/16 0621 10/14/16 0926  WBC 6.6 6.3 6.6  HGB 7.6* 7.6* 7.5*  HCT 25.4* 25.2* 25.0*  PLT 360 341 319   BMET  Recent Labs  10/13/16 2104 10/14/16 0141  NA 138 138  K 3.5 3.6  CL 101 101  CO2 24 25  GLUCOSE 121* 105*  BUN 11 10  CREATININE 0.87 0.80  CALCIUM 9.4 9.6   LFT  Recent Labs  10/13/16 2104  PROT 6.6  ALBUMIN 3.0*  AST 22  ALT 10*  ALKPHOS 112  BILITOT 0.1*   PT/INR No results for input(s): LABPROT, INR in the last 72 hours.   Studies/Results: Dg Chest 2 View  Result Date: 10/13/2016 CLINICAL DATA:  Palpitations and shortness of breath. EXAM: CHEST  2 VIEW COMPARISON:  08/31/2016 FINDINGS: Post median sternotomy and CABG. Unchanged heart size and mediastinal contours, heart upper limits of normal. Probable hiatal hernia. Development of multifocal ill-defined opacities within both lungs, right greater than left, some of which appear nodular. Streaky scarring in the left mid lung. Gastric band is faintly visualized. No acute osseous abnormalities are seen. IMPRESSION: Multifocal opacities in both lungs that appear nodule or, right greater than left. These appear new from exam 6 weeks prior. This may be infectious or inflammatory, however CT characterization may be helpful. Electronically Signed   By: Jeb Levering M.D.   On: 10/13/2016 20:26   Ct Chest Wo Contrast  Result Date: 10/14/2016 CLINICAL DATA:  Assess pulmonary nodules noted on chest radiograph. Initial encounter. EXAM: CT CHEST WITHOUT CONTRAST TECHNIQUE: Multidetector CT imaging of the chest was  performed following the standard protocol without IV contrast. COMPARISON:  Chest radiograph from 10/13/2016 FINDINGS: Cardiovascular: The patient is status post median sternotomy. Diffuse coronary artery calcifications are seen. The heart remains normal in size. Scattered calcification is noted along the aortic arch and descending thoracic aorta. Mild calcification is seen at the proximal great vessels. Mediastinum/Nodes: The esophagus is filled with fluid, raising question for esophageal dysmotility or gastroesophageal reflux. Scattered calcifications and heterogeneity are noted at the thyroid gland. No dominant mass is seen. No mediastinal lymphadenopathy is seen. No pericardial effusion is identified. No axillary lymphadenopathy is identified. Lungs/Pleura: Multiple scattered nodules and associated tiny opacities are noted throughout both lungs, more prominent on the right side. Given that these are new from January, these are most likely infectious in nature. No pleural effusion or pneumothorax is seen. No dominant mass is identified. Upper Abdomen: The mildly nodular contour of the liver raises question for mild hepatic cirrhosis.  Peripheral calcification is seen at the spleen, possibly reflecting remote injury. The patient is status post cholecystectomy, with clips noted at the gallbladder fossa. The visualized portions of the pancreas and adrenal glands are within normal limits. There is chronic severe bilateral renal atrophy. A gastric band is noted. Musculoskeletal: No acute osseous abnormalities are identified. Mild multilevel vacuum phenomenon is noted along the thoracic spine. The visualized musculature is unremarkable in appearance. IMPRESSION: 1. Scattered nodules and associated tiny opacities within both lungs, more prominent on the right. Given that these are new from January, they are most likely infectious in nature. Would suggest Pulmonary consultation for further evaluation, to assess the  underlying infectious etiology. 2. Esophagus filled with fluid, raising question for esophageal dysmotility or gastroesophageal reflux. 3. Diffuse coronary artery calcifications seen. 4. Suggestion of mild hepatic cirrhosis. 5. Chronic severe bilateral renal atrophy. Electronically Signed   By: Garald Balding M.D.   On: 10/14/2016 02:58    Impression/Plan: - Symptomatic anemia. HgB was 11.6 in September now 7.6. Iron study shows low iron saturation and ferritin of 9 consistent with iron deficiency anemia. - On and off black tarry stool after taking Alka-Seltzer. Occult blood negative - Epigastric pain. Need to rule out ulcer disease - Bright blood on the tissue paper and bowel movement - History of coronary artery disease status post CABG and multiple PCI. Currently on Plavix. Last dose was yesterday. No active chest pain.  - Abnormal CT scan showing multiple lung nodules  suspicious for infection. - ?? Early cirrhosis based on CT chest . Normal LFTs. Normal platelets. - Personal history of tubular adenoma during colonoscopy in 2012. Multiple no shows after that in GI clinic - Status post kidney transplant for end-stage renal disease. On Imuran. - COPD  Recommendation ----------------------- - Transfuse as needed. Plavix is currently on hold. Last dose yesterday. Continue PPI  - We will get ultrasound right upper quadrant to rule out liver cirrhosis. We'll get PT/INR/Hepatits panel  - Plan for EGD tomorrow. She will need propofol because of multiple medical comorbidities. -  She may need inpatient colonoscopy prior to discharge - Clear liquid diet for now. - Monitor hemoglobin. GI will follow. -    LOS: 0 days   Otis Brace  MD, FACP 10/14/2016, 10:12 AM  Pager [phone] If no answer or after 5 PM call [phone]

## 2016-10-15 NOTE — Anesthesia Procedure Notes (Signed)
Procedure Name: MAC Date/Time: 10/15/2016 8:46 AM Performed by: Mervyn Gay Pre-anesthesia Checklist: Patient identified, Patient being monitored, Timeout performed, Emergency Drugs available and Suction available Patient Re-evaluated:Patient Re-evaluated prior to inductionOxygen Delivery Method: Nasal cannula Preoxygenation: Pre-oxygenation with 100% oxygen Number of attempts: 1 Placement Confirmation: positive ETCO2 Dental Injury: Teeth and Oropharynx as per pre-operative assessment

## 2016-10-15 NOTE — Anesthesia Preprocedure Evaluation (Addendum)
Anesthesia Evaluation  Patient identified by MRN, date of birth, ID band Patient awake    Reviewed: Allergy & Precautions, NPO status , Patient's Chart, lab work & pertinent test results  Airway Mallampati: I  TM Distance: >3 FB Neck ROM: Full    Dental   Pulmonary asthma , sleep apnea , pneumonia, COPD, former smoker,    breath sounds clear to auscultation       Cardiovascular hypertension, Pt. on medications + CAD, + Past MI, + CABG and + Peripheral Vascular Disease  + dysrhythmias Atrial Fibrillation  Rhythm:Regular Rate:Normal     Neuro/Psych    GI/Hepatic GERD  ,S/p lap band surgery   Endo/Other  diabetes  Renal/GU Renal disease (s/p Renal transplant)     Musculoskeletal  (+) Arthritis , Fibromyalgia -  Abdominal   Peds  Hematology  (+) anemia ,   Anesthesia Other Findings   Reproductive/Obstetrics                           Lab Results  Component Value Date   WBC 6.0 10/15/2016   HGB 8.8 (L) 10/15/2016   HCT 28.5 (L) 10/15/2016   MCV 85.1 10/15/2016   PLT 316 10/15/2016   Lab Results  Component Value Date   CREATININE 0.80 10/14/2016   BUN 10 10/14/2016   NA 138 10/14/2016   K 3.6 10/14/2016   CL 101 10/14/2016   CO2 25 10/14/2016    Anesthesia Physical Anesthesia Plan  ASA: IV  Anesthesia Plan: MAC   Post-op Pain Management:    Induction: Intravenous  Airway Management Planned: Natural Airway and Nasal Cannula  Additional Equipment:   Intra-op Plan:   Post-operative Plan:   Informed Consent: I have reviewed the patients History and Physical, chart, labs and discussed the procedure including the risks, benefits and alternatives for the proposed anesthesia with the patient or authorized representative who has indicated his/her understanding and acceptance.   Dental advisory given  Plan Discussed with: CRNA  Anesthesia Plan Comments:          Anesthesia Quick Evaluation

## 2016-10-15 NOTE — Progress Notes (Signed)
PROGRESS NOTE    Katherine [name]  XHB:[id number] DOB: 25-Aug-1955 DOA: 10/13/2016 PCP: Placido Sou, MD    Brief Narrative:  This is a 61 yo female with history of CAD on plavix, presents with the chief complain of fatigue, weakness and cough. Has been having epigastric abdominal pain, associated with diarrhea and black stools. Follows with Dr. Kalman Shan for GI. On the initial physical examination found to be pale, but hemodynamically stable. Hb down to the 7 range. Admitted for blood transfusion and further GI diagnostics.    Assessment & Plan:   Principal Problem:   Symptomatic anemia Active Problems:   Sleep apnea   Morbid obesity - s/p Lap Band 9/'05   S/P CABG (coronary artery bypass graft) x 3   ST elevation myocardial infarction (STEMI) of inferoposterior wall; H/o -- Rescue PTCA of RCA -- referred for CABG   CAD S/P percutaneous coronary angioplasty - PCI x 5 to SVG-D1   Hypertension associated with diabetes ([place])   PAD (peripheral artery disease) (HCC)   PAF (paroxysmal atrial fibrillation) (HCC)   Acute GI bleeding   Lung nodules   Anemia  1. Acute blood loss anemia due to upper GI bleed. Continue antiacid therapy with protonix, EGD with polyp, no signs of active bleeding, patient scheduled for colonoscopy in am. Bowel prep per GI.  2. CAD sp CABG. Continue rosuvastatin. No chest pain or angina.   3. SP renal transplant. Patient on  immunosuppressive therapy with azathioprine and will resume prednisone.    4. T2DM. Glucose cover and monitoring with insulin sliding scale, capillary glucose 92-112-102. .   5, Asthma. No signs of exacerbation.   6. Lung nodules. Patient at risk for opportunistic lung infections, differential diagnosis, pneumocystis, mycobacterium. Cased discussed with Pulmonary, recommendations to arrange for outpatient follow up, [phone]. Will plan to treat for 8 days with levofloxacin and follow up chest imaging.   7. HTN. Blood  pressure with systolic in the 852-778.   8. Paroxysmal atrial fibrillation. Continue rate control with diltiazem.   9. Depression.  On duloxetine, tegretol, lorazepam and sertraline.   DVT prophylaxis: scd  Code Status: full  Family Communication: No family at the bedside Disposition Plan: Home    Consultants:   Gastroenterology   Procedures:   Antimicrobials   Subjective: Positive cough and feeling generalized weakness, no nausea or vomiting, had egd today, no rectal bleeding.   Objective: Vitals:   10/15/16 0910 10/15/16 0911 10/15/16 0920 10/15/16 0930  BP:  125/64 135/78 (!) 162/78  Pulse: 91 88 89 84  Resp:  20 19 17   Temp:  98.3 F (36.8 C)    TempSrc:  Oral    SpO2: 100% 99% 100% 100%  Weight:      Height:        Intake/Output Summary (Last 24 hours) at 10/15/16 1031 Last data filed at 10/15/16 0905  Gross per 24 hour  Intake          1659.33 ml  Output              900 ml  Net           759.33 ml   Filed Weights   10/14/16 0315 10/15/16 0423 10/15/16 0745  Weight: 81.4 kg (179 lb 8 oz) 81.3 kg (179 lb 3.2 oz) 81.3 kg (179 lb 3.2 oz)    Examination:  General exam: mild deconditioned E ENT: mild pallor, no icterus.  Respiratory system: No wheezing or rales. Respiratory effort normal.  Cardiovascular system: S1 & S2 heard, RRR. No JVD, murmurs, rubs, gallops or clicks. No pedal edema. Gastrointestinal system: Abdomen is nondistended, soft.  Positive tenderness at the epigastric region. No organomegaly or masses felt. Normal bowel sounds heard. Central nervous system: Alert and oriented. No focal neurological deficits. Extremities: Symmetric 5 x 5 power. Skin: No rashes, lesions or ulcers  Data Reviewed: I have personally reviewed following labs and imaging studies  CBC:  Recent Labs Lab 10/13/16 2104 10/14/16 0141 10/14/16 0621 10/14/16 0926 10/15/16 0227  WBC 6.9 6.6 6.3 6.6 6.0  HGB 7.6* 7.6* 7.6* 7.5* 8.8*  HCT 25.3* 25.4* 25.2*  25.0* 28.5*  MCV 85.8 86.1 86.3 86.2 85.1  PLT 359 360 341 319 295   Basic Metabolic Panel:  Recent Labs Lab 10/13/16 2104 10/14/16 0141  NA 138 138  K 3.5 3.6  CL 101 101  CO2 24 25  GLUCOSE 121* 105*  BUN 11 10  CREATININE 0.87 0.80  CALCIUM 9.4 9.6   GFR: Estimated Creatinine Clearance: 73.2 mL/min (by C-G formula based on SCr of 0.8 mg/dL). Liver Function Tests:  Recent Labs Lab 10/13/16 2104  AST 22  ALT 10*  ALKPHOS 112  BILITOT 0.1*  PROT 6.6  ALBUMIN 3.0*    Recent Labs Lab 10/13/16 2104  LIPASE 19   No results for input(s): AMMONIA in the last 168 hours. Coagulation Profile:  Recent Labs Lab 10/14/16 1230  INR 1.08   Cardiac Enzymes: No results for input(s): CKTOTAL, CKMB, CKMBINDEX, TROPONINI in the last 168 hours. BNP (last 3 results)  Recent Labs  02/07/16 1224  PROBNP 139.0*   HbA1C: No results for input(s): HGBA1C in the last 72 hours. CBG:  Recent Labs Lab 10/14/16 1126 10/14/16 1619 10/14/16 2000 10/15/16 0033 10/15/16 0412  GLUCAP 84 117* 99 92 112*   Lipid Profile: No results for input(s): CHOL, HDL, LDLCALC, TRIG, CHOLHDL, LDLDIRECT in the last 72 hours. Thyroid Function Tests: No results for input(s): TSH, T4TOTAL, FREET4, T3FREE, THYROIDAB in the last 72 hours. Anemia Panel:  Recent Labs  10/14/16 0141  VITAMINB12 389  FOLATE 9.3  FERRITIN 9*  TIBC 301  IRON 24*  RETICCTPCT 2.5   Sepsis Labs: No results for input(s): PROCALCITON, LATICACIDVEN in the last 168 hours.  Recent Results (from the past 240 hour(s))  MRSA PCR Screening     Status: None   Collection Time: 10/14/16  3:25 AM  Result Value Ref Range Status   MRSA by PCR NEGATIVE NEGATIVE Final    Comment:        The GeneXpert MRSA Assay (FDA approved for NASAL specimens only), is one component of a comprehensive MRSA colonization surveillance program. It is not intended to diagnose MRSA infection nor to guide or monitor treatment for MRSA  infections.   Culture, sputum-assessment     Status: None   Collection Time: 10/14/16  2:03 PM  Result Value Ref Range Status   Specimen Description SPUTUM  Final   Special Requests NONE  Final   Sputum evaluation THIS SPECIMEN IS ACCEPTABLE FOR SPUTUM CULTURE  Final   Report Status 10/14/2016 FINAL  Final  Culture, respiratory (NON-Expectorated)     Status: None (Preliminary result)   Collection Time: 10/14/16  2:03 PM  Result Value Ref Range Status   Specimen Description SPUTUM  Final   Special Requests NONE Reflexed from [id number]  Final   Gram Stain   Final    ABUNDANT WBC PRESENT,BOTH PMN AND MONONUCLEAR ABUNDANT GRAM POSITIVE  RODS FEW GRAM NEGATIVE RODS RARE GRAM POSITIVE COCCI IN PAIRS RARE YEAST    Culture PENDING  Incomplete   Report Status PENDING  Incomplete         Radiology Studies: Dg Chest 2 View  Result Date: 10/13/2016 CLINICAL DATA:  Palpitations and shortness of breath. EXAM: CHEST  2 VIEW COMPARISON:  08/31/2016 FINDINGS: Post median sternotomy and CABG. Unchanged heart size and mediastinal contours, heart upper limits of normal. Probable hiatal hernia. Development of multifocal ill-defined opacities within both lungs, right greater than left, some of which appear nodular. Streaky scarring in the left mid lung. Gastric band is faintly visualized. No acute osseous abnormalities are seen. IMPRESSION: Multifocal opacities in both lungs that appear nodule or, right greater than left. These appear new from exam 6 weeks prior. This may be infectious or inflammatory, however CT characterization may be helpful. Electronically Signed   By: Jeb Levering M.D.   On: 10/13/2016 20:26   Ct Chest Wo Contrast  Result Date: 10/14/2016 CLINICAL DATA:  Assess pulmonary nodules noted on chest radiograph. Initial encounter. EXAM: CT CHEST WITHOUT CONTRAST TECHNIQUE: Multidetector CT imaging of the chest was performed following the standard protocol without IV contrast. COMPARISON:   Chest radiograph from 10/13/2016 FINDINGS: Cardiovascular: The patient is status post median sternotomy. Diffuse coronary artery calcifications are seen. The heart remains normal in size. Scattered calcification is noted along the aortic arch and descending thoracic aorta. Mild calcification is seen at the proximal great vessels. Mediastinum/Nodes: The esophagus is filled with fluid, raising question for esophageal dysmotility or gastroesophageal reflux. Scattered calcifications and heterogeneity are noted at the thyroid gland. No dominant mass is seen. No mediastinal lymphadenopathy is seen. No pericardial effusion is identified. No axillary lymphadenopathy is identified. Lungs/Pleura: Multiple scattered nodules and associated tiny opacities are noted throughout both lungs, more prominent on the right side. Given that these are new from January, these are most likely infectious in nature. No pleural effusion or pneumothorax is seen. No dominant mass is identified. Upper Abdomen: The mildly nodular contour of the liver raises question for mild hepatic cirrhosis. Peripheral calcification is seen at the spleen, possibly reflecting remote injury. The patient is status post cholecystectomy, with clips noted at the gallbladder fossa. The visualized portions of the pancreas and adrenal glands are within normal limits. There is chronic severe bilateral renal atrophy. A gastric band is noted. Musculoskeletal: No acute osseous abnormalities are identified. Mild multilevel vacuum phenomenon is noted along the thoracic spine. The visualized musculature is unremarkable in appearance. IMPRESSION: 1. Scattered nodules and associated tiny opacities within both lungs, more prominent on the right. Given that these are new from January, they are most likely infectious in nature. Would suggest Pulmonary consultation for further evaluation, to assess the underlying infectious etiology. 2. Esophagus filled with fluid, raising question  for esophageal dysmotility or gastroesophageal reflux. 3. Diffuse coronary artery calcifications seen. 4. Suggestion of mild hepatic cirrhosis. 5. Chronic severe bilateral renal atrophy. Electronically Signed   By: Garald Balding M.D.   On: 10/14/2016 02:58   US Abdomen Limited Ruq  Result Date: 10/14/2016 CLINICAL DATA:  Right upper quadrant abdominal pain for 1 month. History of renal transplant. Dialysis patient. Status post cholecystectomy. EXAM: US ABDOMEN LIMITED - RIGHT UPPER QUADRANT COMPARISON:  CT scan 07/28/2014 FINDINGS: Gallbladder: Surgically absent. Common bile duct: Diameter: 4.8 mm Liver: The liver contour is irregular and the left hepatic lobe appears enlarged. Findings suggestive of cirrhosis. No focal hepatic lesions or intrahepatic biliary  dilatation. The liver echotexture is mildly coarse. IMPRESSION: Ultrasound findings suggest cirrhotic changes involving the liver. No focal hepatic lesions. Status post cholecystectomy.  No biliary dilatation. Electronically Signed   By: Marijo Sanes M.D.   On: 10/14/2016 19:02        Scheduled Meds: . sodium chloride   Intravenous Once  . azaTHIOprine  125 mg Oral QPM  . calcitRIOL  0.25 mcg Oral q morning - 10a  . carbamazepine  200 mg Oral BID  . cyclobenzaprine  10 mg Oral BID  . diltiazem  120 mg Oral Daily  . DULoxetine  60 mg Oral q morning - 10a  . fluticasone  2 spray Each Nare Daily  . insulin aspart  0-9 Units Subcutaneous Q4H  . levofloxacin (LEVAQUIN) IV  500 mg Intravenous Q24H  . LORazepam  1 mg Oral q morning - 10a  . mometasone-formoterol  2 puff Inhalation BID  . montelukast  10 mg Oral QHS  . [START ON 10/17/2016] pantoprazole  40 mg Intravenous Q12H  . rosuvastatin  20 mg Oral q1800  . sertraline  100 mg Oral q morning - 10a   Continuous Infusions: . pantoprozole (PROTONIX) infusion 8 mg/hr (10/14/16 2237)     LOS: 1 day        Mauricio Gerome Apley, MD [facility] Pager [phone]  If  7PM-7AM, please contact night-coverage [email or web address] Password [email or web address] 10/15/2016, 10:31 AM

## 2016-10-15 NOTE — Transfer of Care (Signed)
Immediate Anesthesia Transfer of Care Note  Patient: Rogene Meth  Procedure(s) Performed: Procedure(s): ESOPHAGOGASTRODUODENOSCOPY (EGD) (N/A)  Patient Location:  ENDO Recover   Anesthesia Type:MAC  Level of Consciousness: awake, alert , oriented and patient cooperative  Airway & Oxygen Therapy: Patient Spontanous Breathing and Patient connected to nasal cannula oxygen  Post-op Assessment: Report given to RN, Post -op Vital signs reviewed and stable and Patient moving all extremities X 4  Post vital signs: Reviewed and stable  Last Vitals:  Vitals:   10/15/16 0423 10/15/16 0745  BP: (!) 147/69 (!) 157/82  Pulse: 91 80  Resp: 18 13  Temp: 36.6 C 37 C    Last Pain:  Vitals:   10/15/16 0745  TempSrc: Oral  PainSc: 3       Patients Stated Pain Goal: 0 (06/10/84 2778)  Complications: No apparent anesthesia complications

## 2016-10-15 NOTE — Brief Op Note (Signed)
Polypoid lesion in duodenum without any bleeding stigmata or ulceration seen. I do not think this lesion is the source of her GI bleed and therefore will plan to do a colonoscopy tomorrow. She does have Candida esophagitis and needs to be given IV Diflucan until discharge and then PO Diflucan. Clear liquid diet. Colon prep today.

## 2016-10-15 NOTE — Anesthesia Postprocedure Evaluation (Signed)
Anesthesia Post Note  Patient: Katherine [name]  Procedure(s) Performed: Procedure(s) (LRB): ESOPHAGOGASTRODUODENOSCOPY (EGD) (N/A)  Patient location during evaluation: PACU Anesthesia Type: MAC Level of consciousness: awake and alert Pain management: pain level controlled Vital Signs Assessment: post-procedure vital signs reviewed and stable Respiratory status: spontaneous breathing, nonlabored ventilation, respiratory function stable and patient connected to nasal cannula oxygen Cardiovascular status: stable and blood pressure returned to baseline Anesthetic complications: no       Last Vitals:  Vitals:   10/15/16 0920 10/15/16 0930  BP: 135/78 (!) 162/78  Pulse: 89 84  Resp: 19 17  Temp:      Last Pain:  Vitals:   10/15/16 0911  TempSrc: Oral  PainSc:                  [name]

## 2016-10-15 NOTE — Consult Note (Signed)
[facility] CM Primary Care Navigator  10/15/2016  Katherine [name] 1956/06/02 [id number]   Met with patient and husband Katherine [name]) at the bedside to identify possible discharge needs. Patient reports having severe fatigue and abdominal pain that had led to this admission.  Patient endorses Dr. Jeneen Rinks Deterding (seeing since 61 yrs old per patient) with [facility] as her primary care provider.    Patient shared using [facility] to obtain medications without any problem.   Patient verbalized managing her own medications at home straight out of the containers.   She reports driving prior to admission and husband can provide transportation to her doctors' appointments if needed.  Husband will be her primary caregiver at home per patient.   Anticipated discharge plan is home with husband's assistance as stated.  Patient voiced understanding to call primary care provider's office when she returns back home, for a post discharge follow-up appointment within a week or sooner if needs arise. Patient letter (with PCP's contact number) was provided as a reminder.  Discussed with patient regarding [facility] care management services available for health management and states that she had been managing it well. She denies any further needs or concerns at this time. [facility] care management contact information provided for future needs that may arise.  For additional questions please contact:  Edwena Felty A. Lillymae Duet, BSN, RN-BC [facility] PRIMARY CARE Navigator Cell: [phone]

## 2016-10-15 NOTE — Op Note (Signed)
[facility] Patient Name: Katherine [name] Procedure Date : 10/15/2016 MRN: [id number] Attending MD: Lear Ng , MD Date of Birth: May 10, 1956 CSN: [id number] Age: 61 Admit Type: Inpatient Procedure:                Upper GI endoscopy Indications:              Suspected upper gastrointestinal bleeding, Melena Providers:                Lear Ng, MD, Vista Lawman, RN, Elspeth Cho Tech., Technician, Cathe Mons, CRNA Referring MD:              Medicines:                Propofol per Anesthesia, Monitored Anesthesia Care Complications:            No immediate complications. Estimated Blood Loss:     Estimated blood loss was minimal. Procedure:                Pre-Anesthesia Assessment:                           - Prior to the procedure, a History and Physical                            was performed, and patient medications and                            allergies were reviewed. The patient's tolerance of                            previous anesthesia was also reviewed. The risks                            and benefits of the procedure and the sedation                            options and risks were discussed with the patient.                            All questions were answered, and informed consent                            was obtained. Prior Anticoagulants: The patient has                            taken no previous anticoagulant or antiplatelet                            agents. ASA Grade Assessment: IV - A patient with                            severe systemic disease that is a constant threat  to life. After reviewing the risks and benefits,                            the patient was deemed in satisfactory condition to                            undergo the procedure.                           After obtaining informed consent, the endoscope was                            passed under direct  vision. Throughout the                            procedure, the patient's blood pressure, pulse, and                            oxygen saturations were monitored continuously. The                            EG-[id number] ([id number]) scope was introduced through the                            mouth, and advanced to the second part of duodenum.                            The upper GI endoscopy was accomplished without                            difficulty. The patient tolerated the procedure                            well. Scope In: Scope Out: Findings:      The Z-line was regular and was found 42 cm from the incisors.      Patchy candidiasis was found in the entire esophagus.      Segmental minimal inflammation characterized by erythema was found in       the prepyloric region of the stomach.      The cardia and gastric fundus were normal on retroflexion.      The exam of the stomach was otherwise normal.      A medium-sized polypoid mass with no bleeding was found in the second       portion of the duodenum. Biopsies were taken with a cold forceps for       histology. Estimated blood loss was minimal.      The exam of the duodenum was otherwise normal. Impression:               - Z-line regular, 42 cm from the incisors.                           - Monilial esophagitis.                           - Acute  gastritis.                           - Rule out malignancy, duodenal mass. Biopsied. Moderate Sedation:      N/A - MAC procedure Recommendation:           - Await pathology results.                           - Clear liquid diet.                           - Post procedure medication orders were given. Procedure Code(s):        --- Professional ---                           [phone], Esophagogastroduodenoscopy, flexible,                            transoral; with biopsy, single or multiple Diagnosis Code(s):        --- Professional ---                           K92.1, Melena (includes  Hematochezia)                           B37.81, Candidal esophagitis                           K29.00, Acute gastritis without bleeding                           K31.89, Other diseases of stomach and duodenum CPT copyright 2016 [employer]. All rights reserved. The codes documented in this report are preliminary and upon coder review may  be revised to meet current compliance requirements. Lear Ng, MD 10/15/2016 9:14:43 AM This report has been signed electronically. Number of Addenda: 0

## 2016-10-16 ENCOUNTER — Encounter (HOSPITAL_COMMUNITY)
Admission: EM | Disposition: A | Discharge status: Home / Self Care | Payer: Self-pay | Source: Home / Self Care | Attending: Internal Medicine

## 2016-10-16 ENCOUNTER — Encounter (HOSPITAL_COMMUNITY): Payer: Self-pay | Admitting: Gastroenterology

## 2016-10-16 DIAGNOSIS — D649 Anemia, unspecified: Secondary | ICD-10-CM

## 2016-10-16 DIAGNOSIS — R918 Other nonspecific abnormal finding of lung field: Secondary | ICD-10-CM

## 2016-10-16 DIAGNOSIS — R05 Cough: Secondary | ICD-10-CM

## 2016-10-16 DIAGNOSIS — Z7952 Long term (current) use of systemic steroids: Secondary | ICD-10-CM

## 2016-10-16 DIAGNOSIS — K921 Melena: Principal | ICD-10-CM

## 2016-10-16 DIAGNOSIS — Z79899 Other long term (current) drug therapy: Secondary | ICD-10-CM

## 2016-10-16 DIAGNOSIS — Z940 Kidney transplant status: Secondary | ICD-10-CM

## 2016-10-16 LAB — CBC WITH DIFFERENTIAL/PLATELET
Basophils Absolute: 0 10*3/uL (ref 0.0–0.1)
Basophils Relative: 0 %
EOS ABS: 0.1 10*3/uL (ref 0.0–0.7)
Eosinophils Relative: 2 %
HCT: 28 % — ABNORMAL LOW (ref 36.0–46.0)
HEMOGLOBIN: 8.7 g/dL — AB (ref 12.0–15.0)
LYMPHS ABS: 1.2 10*3/uL (ref 0.7–4.0)
Lymphocytes Relative: 25 %
MCH: 26.6 pg (ref 26.0–34.0)
MCHC: 31.1 g/dL (ref 30.0–36.0)
MCV: 85.6 fL (ref 78.0–100.0)
MONOS PCT: 8 %
Monocytes Absolute: 0.4 10*3/uL (ref 0.1–1.0)
NEUTROS ABS: 3.1 10*3/uL (ref 1.7–7.7)
NEUTROS PCT: 65 %
Platelets: 296 10*3/uL (ref 150–400)
RBC: 3.27 MIL/uL — ABNORMAL LOW (ref 3.87–5.11)
RDW: 17.1 % — ABNORMAL HIGH (ref 11.5–15.5)
WBC: 4.8 10*3/uL (ref 4.0–10.5)

## 2016-10-16 LAB — GLUCOSE, CAPILLARY
GLUCOSE-CAPILLARY: 115 mg/dL — AB (ref 65–99)
GLUCOSE-CAPILLARY: 144 mg/dL — AB (ref 65–99)
GLUCOSE-CAPILLARY: 111 mg/dL — AB (ref 65–99)
GLUCOSE-CAPILLARY: 123 mg/dL — AB (ref 65–99)
Glucose-Capillary: 119 mg/dL — ABNORMAL HIGH (ref 65–99)
Glucose-Capillary: 163 mg/dL — ABNORMAL HIGH (ref 65–99)

## 2016-10-16 LAB — BASIC METABOLIC PANEL
Anion gap: 12 (ref 5–15)
BUN: 6 mg/dL (ref 6–20)
CHLORIDE: 104 mmol/L (ref 101–111)
CO2: 25 mmol/L (ref 22–32)
Calcium: 9.2 mg/dL (ref 8.9–10.3)
Creatinine, Ser: 0.79 mg/dL (ref 0.44–1.00)
GFR calc non Af Amer: >60 mL/min (ref >60)
Glucose, Bld: 138 mg/dL — ABNORMAL HIGH (ref 65–99)
POTASSIUM: 3 mmol/L — AB (ref 3.5–5.1)
SODIUM: 141 mmol/L (ref 135–145)

## 2016-10-16 SURGERY — COLONOSCOPY WITH PROPOFOL
Anesthesia: Monitor Anesthesia Care

## 2016-10-16 MED ORDER — FERROUS SULFATE 325 (65 FE) MG PO TABS
325.0000 mg | ORAL_TABLET | Freq: Three times a day (TID) | ORAL | Status: DC
  Administered 2016-10-16 – 2016-10-17 (×4): 325 mg via ORAL
  Filled 2016-10-16 (×4): qty 1

## 2016-10-16 MED ORDER — DIPHENHYDRAMINE HCL 25 MG PO CAPS
25.0000 mg | ORAL_CAPSULE | Freq: Once | ORAL | Status: AC
  Administered 2016-10-16: 25 mg via ORAL
  Filled 2016-10-16: qty 1

## 2016-10-16 MED ORDER — POTASSIUM CHLORIDE 20 MEQ PO PACK
40.0000 meq | PACK | ORAL | Status: AC
  Administered 2016-10-16: 40 meq via ORAL
  Filled 2016-10-16 (×2): qty 2

## 2016-10-16 NOTE — Progress Notes (Signed)
ANTIBIOTIC CONSULT NOTE - INITIAL  Pharmacy Consult for Levaquin Indication: pneumonia  Allergies  Allergen Reactions  . Codeine Nausea And Vomiting  . Erythromycin Nausea And Vomiting  . Hydromorphone Hcl Nausea And Vomiting  . Morphine And Related Nausea And Vomiting  . Nalbuphine Nausea And Vomiting  . Niaspan [Niacin Er] Other (See Comments)    Mouth blisters  . Sulfonamide Derivatives Other (See Comments)    Reaction: per patient "tears her stomach up"  . Tetracycline Hives    Patient Measurements: Height: 5\' 1"  (154.9 cm) Weight: 179 lb 3.2 oz (81.3 kg) IBW/kg (Calculated) : 47.8 Adjusted Body Weight:    Vital Signs: Temp: 98.7 F (37.1 C) (03/02 0432) Temp Source: Oral (03/02 0432) BP: 142/82 (03/02 0432) Pulse Rate: 87 (03/02 0432) Intake/Output from previous day: 03/01 0701 - 03/02 0700 In: 1240 [P.O.:840; I.V.:200; IV Piggyback:200] Out: 602 [Urine:600; Emesis/NG output:2] Intake/Output from this shift: Total I/O In: 240 [P.O.:240] Out: 200 [Urine:200]  Labs:  Recent Labs  10/13/16 2104 10/14/16 0141  10/14/16 0926 10/15/16 0227 10/16/16 0231  WBC 6.9 6.6  < > 6.6 6.0 4.8  HGB 7.6* 7.6*  < > 7.5* 8.8* 8.7*  PLT 359 360  < > 319 316 296  CREATININE 0.87 0.80  --   --   --  0.79  < > = values in this interval not displayed. Estimated Creatinine Clearance: 73.2 mL/min (by C-G formula based on SCr of 0.79 mg/dL). No results for input(s): VANCOTROUGH, VANCOPEAK, VANCORANDOM, GENTTROUGH, GENTPEAK, GENTRANDOM, TOBRATROUGH, TOBRAPEAK, TOBRARND, AMIKACINPEAK, AMIKACINTROU, AMIKACIN in the last 72 hours.   Microbiology:   Medical History: Past Medical History:  Diagnosis Date  . Anxiety   . Bilateral carotid artery stenosis    Carotid duplex 03/1016: 5-10% LICA, 25-85% RICA, >27% RECA, f/u 1 yr suggested  . CAD (coronary artery disease) of bypass graft 5/01; 3/'02, 8/'03, 10/'04; 1/15   PCI x 5 to SVG-D1   . CAD in native artery 07/1993    3 Vessel  Disease (LAD-D1 & RCA) -- CABG  . CAD S/P percutaneous coronary angioplasty    PCI to SVG-D1 insertion/native D1 x 4 = '01 -(S660 BMS 2.5 x 9 - insertion into D1; '02 - distal overlap ACS Pixel 2.5 x 8  BMS; '03 distal/native ISR/Thrombosis - Pixel 2.5 x 13; '04 - ISR-  Taxus 2.5 x 20 (covered all);; 1/15 - mid SVG-D1 (50% distal ISR) - Promus P 2.75 x 20 -- 2.8 mm  . COPD mixed type ([place])    Followed by Dr. Lamonte Sakai "pulmonologist said no COPD"  . Depression   . Depression with anxiety   . Diabetes mellitus type 2 in obese ([place])   . Dyslipidemia, goal LDL below 70    08/2012: TC 137, TG 200, HDL 32!, LDL 45; on statin (followed by Dr.Deterding)  . Enlarged heart    "told one time"  . ESRD (end stage renal disease) ([place]) 1991   s/p Cadaveric Renal Transplant [facility] - Dr. Jimmy Footman)   . Family history of adverse reaction to anesthesia    mom's bp dropped during/after anesthesia  . Fibromyalgia   . GERD (gastroesophageal reflux disease)   . Glomerulonephritis, chronic, rapidly progressive 45  . 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 07/1993   Rescue PTCA of RCA -- referred for CABG.  . H/O: GI bleed   . Hypertension associated with diabetes ([place])   . MRSA (methicillin resistant staph aureus) culture positive   . Obesity  s/p Lap Band 04/2004- Port Replacement 10/09 & 2/10 for infection; Dr. Excell Seltzer.  . OSA on CPAP    oxygen @night  @ 2L  . PAD (peripheral artery disease) ([place]) 08/2013   LEA Dopplers to be read by Dr. Fletcher Anon  . PAF (paroxysmal atrial fibrillation) ([place])    Noted on CardioNet Monitor 11/5-18 - rate ~112  . Recurrent boils    Bilateral Groin  . Rheumatoid arthritis ([place])    Per Patient Report; associated with OA  . 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 Servando Snare: LIMA-LAD, SVG-bifurcatingD1, SVG-rPDA  . S/p cadaver renal transplant [place]  . Stroke [facility]) 2012   "right eye stroke- half blind now"  . Unstable angina ([place]) 5/01; 3/'02, 8/'03,  10/'04; 1/15   x 5 occurences since Inf-Post STEMI in 1994    Assessment:  ID: LQ D3/5 for PNA. Also noted w/ candida esophagitis  -WBC= 4.8, Tmax 99.1.  Diflucan 3/1>> LQ 2/28>>  2/28 blood x2- ngtd 2/28 resp- GNR, GPC/pairs, Crestor  Goal of Therapy:  Eradication of infection  Plan:  Levaquin 750mg  IV Q24H. Please d/c after 5 days at this dose! Pharmacy will sign off. Please reconsult for further dosing assitance.   Katherine [name], PharmD, BCPS Clinical Staff Pharmacist Pager [phone]  Katherine [name] 10/16/2016,11:57 AM

## 2016-10-16 NOTE — Plan of Care (Signed)
Problem: Nutrition: Goal: Adequate nutrition will be maintained Outcome: Not Progressing Pt NPO from colonoscopy. Frequently regurgitating fluids. States she needs her lap band released.

## 2016-10-16 NOTE — Consult Note (Signed)
[facility] for Infectious Disease       Reason for Consult: pulmonary nodules    Referring Physician: Dr. Cathlean Sauer  Principal Problem:   Symptomatic anemia Active Problems:   Sleep apnea   Morbid obesity - s/p Lap Band 9/'05   S/P CABG (coronary artery bypass graft) x 3   ST elevation myocardial infarction (STEMI) of inferoposterior wall; H/o -- Rescue PTCA of RCA -- referred for CABG   CAD S/P percutaneous coronary angioplasty - PCI x 5 to SVG-D1   Hypertension associated with diabetes ([place])   PAD (peripheral artery disease) (H[facility])   PAF (paroxysmal atrial fibrillation) (H[facility])   Acute GI bleeding   Lung nodules   Anemia   . sodium chloride   Intravenous Once  . azaTHIOprine  125 mg Oral QPM  . calcitRIOL  0.25 mcg Oral q morning - 10a  . carbamazepine  200 mg Oral BID  . cyclobenzaprine  10 mg Oral BID  . diltiazem  120 mg Oral Daily  . DULoxetine  60 mg Oral q morning - 10a  . ferrous sulfate  325 mg Oral TID WC  . fluconazole (DIFLUCAN) IV  200 mg Intravenous Q24H  . fluticasone  2 spray Each Nare Daily  . insulin aspart  0-9 Units Subcutaneous Q4H  . levofloxacin (LEVAQUIN) IV  500 mg Intravenous Q24H  . LORazepam  1 mg Oral q morning - 10a  . mometasone-formoterol  2 puff Inhalation BID  . montelukast  10 mg Oral QHS  . pantoprazole (PROTONIX) IV  40 mg Intravenous Q12H  . potassium chloride  40 mEq Oral Q4H  . predniSONE  5 mg Oral Q breakfast  . rosuvastatin  20 mg Oral q1800  . sertraline  100 mg Oral q morning - 10a    Recommendations: Sputum for bacterial, AFB (for NTM, no indication for isolation), fungal  Consider BAL if no organism identified by sputum Stop levaquin  Assessment: She has new, small bilateral pulmonary nodules in this patient on imuran and prednisone.  No pulmonary symptoms that are new or concerning.  Not c/w a bacterial process, no hypoxia, no sob, no new productive cough.  Symptomatic anemia with melena.  Renal transplant on  immunosuppressives  Dr. Baxter Flattery will follow cultures over the weekend  Antibiotics: levaquin  HPI: Katherine [name] is a 61 y.o. female with history of renal failure with a renal transplant on immunosuppressives with prednisone and imuran who came in with symptomatic anemia and attempt to do EGD and colonoscopy and work up noted on CXR and then CT small pulmonary nodules.  New since previous CXR in January.  No fever, normal WBC.  No new symptoms.  Has chronic cough that has not changed, no new sob or dypsnea.  Some recent weight loss over the last year.  CT chest independently reviewed and small nodules  Review of Systems:  Constitutional: negative for fevers, chills and sweats Respiratory: positive for cough, negative for hemoptysis Cardiovascular: negative for chest pressure/discomfort Gastrointestinal: negative for diarrhea Integument/breast: negative for rash Musculoskeletal: positive for arthralgias, negative for myalgias All other systems reviewed and are negative    Past Medical History:  Diagnosis Date  . Anxiety   . Bilateral carotid artery stenosis    Carotid duplex 12/6431: 2-95% LICA, 18-84% RICA, >16% RECA, f/u 1 yr suggested  . CAD (coronary artery disease) of bypass graft 5/01; 3/'02, 8/'03, 10/'04; 1/15   PCI x 5 to SVG-D1   . CAD in native  artery 07/1993    3 Vessel Disease (LAD-D1 & RCA) -- CABG  . CAD S/P percutaneous coronary angioplasty    PCI to SVG-D1 insertion/native D1 x 4 = '01 -(S660 BMS 2.5 x 9 - insertion into D1; '02 - distal overlap ACS Pixel 2.5 x 8  BMS; '03 distal/native ISR/Thrombosis - Pixel 2.5 x 13; '04 - ISR-  Taxus 2.5 x 20 (covered all);; 1/15 - mid SVG-D1 (50% distal ISR) - Promus P 2.75 x 20 -- 2.8 mm  . COPD mixed type ([place])    Followed by Dr. Lamonte Sakai "pulmonologist said no COPD"  . Depression   . Depression with anxiety   . Diabetes mellitus type 2 in obese ([place])   . Dyslipidemia, goal LDL below 70    08/2012: TC 137, TG 200, HDL 32!, LDL  45; on statin (followed by Dr.Deterding)  . Enlarged heart    "told one time"  . ESRD (end stage renal disease) ([place]) 1991   s/p Cadaveric Renal Transplant [facility] - Dr. Jimmy Footman)   . Family history of adverse reaction to anesthesia    mom's bp dropped during/after anesthesia  . Fibromyalgia   . GERD (gastroesophageal reflux disease)   . Glomerulonephritis, chronic, rapidly progressive 63  . 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 07/1993   Rescue PTCA of RCA -- referred for CABG.  . H/O: GI bleed   . Hypertension associated with diabetes ([place])   . MRSA (methicillin resistant staph aureus) culture positive   . Obesity    s/p Lap Band 04/2004- Port Replacement 10/09 & 2/10 for infection; Dr. Excell Seltzer.  . OSA on CPAP    oxygen @night  @ 2L  . PAD (peripheral artery disease) ([place]) 08/2013   LEA Dopplers to be read by Dr. Fletcher Anon  . PAF (paroxysmal atrial fibrillation) ([place])    Noted on CardioNet Monitor 11/5-18 - rate ~112  . Recurrent boils    Bilateral Groin  . Rheumatoid arthritis ([place])    Per Patient Report; associated with OA  . 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 Servando Snare: LIMA-LAD, SVG-bifurcatingD1, SVG-rPDA  . S/p cadaver renal transplant [place]  . Stroke [facility]) 2012   "right eye stroke- half blind now"  . Unstable angina ([place]) 5/01; 3/'02, 8/'03, 10/'04; 1/15   x 5 occurences since Inf-Post STEMI in 1994    Social History  Substance Use Topics  . Smoking status: Former Smoker    Packs/day: 1.00    Years: 30.00    Types: Cigarettes    Quit date: 08/17/2002  . Smokeless tobacco: Never Used  . Alcohol use No    Family History  Problem Relation Age of Onset  . Cancer Mother     liver  . Heart disease Father   . Cancer Father     colon  . Arrhythmia Brother     Atrial Fibrillation  . Arrhythmia Paternal Aunt     Atrial Fibrillation    Allergies  Allergen Reactions  . Codeine Nausea And Vomiting  . Erythromycin Nausea And Vomiting    . Hydromorphone Hcl Nausea And Vomiting  . Morphine And Related Nausea And Vomiting  . Nalbuphine Nausea And Vomiting  . Niaspan [Niacin Er] Other (See Comments)    Mouth blisters  . Sulfonamide Derivatives Other (See Comments)    Reaction: per patient "tears her stomach up"  . Tetracycline Hives    Physical Exam: Constitutional: in no apparent distress and alert  Vitals:  10/16/16 0432 10/16/16 1316  BP: (!) 142/82 132/76  Pulse: 87 97  Resp: 18 18  Temp: 98.7 F (37.1 C) 97.6 F (36.4 C)   EYES: anicteric ENMT: no thrush Cardiovascular: Cor RRR Respiratory: CTA B; normal respiratory effort GI: Bowel sounds are normal, liver is not enlarged, spleen is not enlarged Musculoskeletal: no pedal edema noted Skin: negatives: no rash Hematologic: no cervical lad, no supraclavicular lad  Lab Results  Component Value Date   WBC 4.8 10/16/2016   HGB 8.7 (L) 10/16/2016   HCT 28.0 (L) 10/16/2016   MCV 85.6 10/16/2016   PLT 296 10/16/2016    Lab Results  Component Value Date   CREATININE 0.79 10/16/2016   BUN 6 10/16/2016   NA 141 10/16/2016   K 3.0 (L) 10/16/2016   CL 104 10/16/2016   CO2 25 10/16/2016    Lab Results  Component Value Date   ALT 10 (L) 10/13/2016   AST 22 10/13/2016   ALKPHOS 112 10/13/2016     Microbiology: Recent Results (from the past 240 hour(s))  MRSA PCR Screening     Status: None   Collection Time: 10/14/16  3:25 AM  Result Value Ref Range Status   MRSA by PCR NEGATIVE NEGATIVE Final    Comment:        The GeneXpert MRSA Assay (FDA approved for NASAL specimens only), is one component of a comprehensive MRSA colonization surveillance program. It is not intended to diagnose MRSA infection nor to guide or monitor treatment for MRSA infections.   Culture, blood (routine x 2) Call MD if unable to obtain prior to antibiotics being given     Status: None (Preliminary result)   Collection Time: 10/14/16  4:02 AM  Result Value Ref Range  Status   Specimen Description BLOOD RIGHT HAND  Final   Special Requests   Final    BOTTLES DRAWN AEROBIC AND ANAEROBIC 4CC RED 5CC BLUE   Culture NO GROWTH 2 DAYS  Final   Report Status PENDING  Incomplete  Culture, blood (routine x 2) Call MD if unable to obtain prior to antibiotics being given     Status: None (Preliminary result)   Collection Time: 10/14/16  4:13 AM  Result Value Ref Range Status   Specimen Description BLOOD LEFT ARM  Final   Special Requests   Final    BOTTLES DRAWN AEROBIC AND ANAEROBIC 3CC RED 4CC BLUE   Culture NO GROWTH 2 DAYS  Final   Report Status PENDING  Incomplete  Culture, sputum-assessment     Status: None   Collection Time: 10/14/16  2:03 PM  Result Value Ref Range Status   Specimen Description SPUTUM  Final   Special Requests NONE  Final   Sputum evaluation THIS SPECIMEN IS ACCEPTABLE FOR SPUTUM CULTURE  Final   Report Status 10/14/2016 FINAL  Final  Culture, respiratory (NON-Expectorated)     Status: None (Preliminary result)   Collection Time: 10/14/16  2:03 PM  Result Value Ref Range Status   Specimen Description SPUTUM  Final   Special Requests NONE Reflexed from [id number]  Final   Gram Stain   Final    ABUNDANT WBC PRESENT,BOTH PMN AND MONONUCLEAR ABUNDANT GRAM POSITIVE RODS FEW GRAM NEGATIVE RODS RARE GRAM POSITIVE COCCI IN PAIRS RARE YEAST    Culture CULTURE REINCUBATED FOR BETTER GROWTH  Final   Report Status PENDING  Incomplete    COMERHerbie Baltimore, [facility] for Infectious Disease [facility] [email or web address] [phone] pager  [phone] cell 10/16/2016, 3:23 PM

## 2016-10-16 NOTE — Progress Notes (Signed)
PROGRESS NOTE    Katherine [name]  ZOX:[id number] DOB: 11-02-1955 DOA: 10/13/2016 PCP: Placido Sou, MD     Brief Narrative: This is a 61 yo female with history of CAD on plavix, presents with the chief complain of fatigue, weakness and cough. Has been having epigastric abdominal pain, associated with diarrhea and black stools. Follows with Dr. Kalman Shan for GI. On the initial physical examination found to be pale, but hemodynamically stable. Hb down to the 7 range. Admitted for blood transfusion and further GI diagnostics.    Assessment & Plan:   Principal Problem:   Symptomatic anemia Active Problems:   Sleep apnea   Morbid obesity - s/p Lap Band 9/'05   S/P CABG (coronary artery bypass graft) x 3   ST elevation myocardial infarction (STEMI) of inferoposterior wall; H/o -- Rescue PTCA of RCA -- referred for CABG   CAD S/P percutaneous coronary angioplasty - PCI x 5 to SVG-D1   Hypertension associated with diabetes ([place])   PAD (peripheral artery disease) (H[facility])   PAF (paroxysmal atrial fibrillation) (HCC)   Acute GI bleeding   Lung nodules   Anemia   1. Acute blood loss anemia due to upper GI bleed. Antiacid therapy with protonix, hb and hct have been stable, plan for outpatient colonoscopy.  2. CAD sp CABG. On rosuvastatin.   3. SP renal transplant. Continue  immunosuppressive therapy with azathioprine and will resume prednisone.   4. T2DM. Glucose cover and monitoring with insulin sliding scale, capillary glucose 144, 163, 111   5, Asthma. No signs of exacerbation. Stable    6. Lung nodules. Case discussed with ID, will get sputum samples and will arrange for outpatient follow up, may need bronchoscopy. Will stop levofloxacin.   7. HTN. Stable blood pressure with systolic in the 811-914.   8. Paroxysmal atrial fibrillation. Continue rate control with diltiazem. Patient off anticoagulation.   9. Depression.  Continue duloxetine, tegretol, lorazepam and  sertraline  Subjective: No chest pain, mild dyspnea and occasional cough, no hemoptysis. No nausea or vomiting, no further gee bleed.   Objective: Vitals:   10/15/16 2058 10/16/16 0432 10/16/16 0752 10/16/16 1316  BP:  (!) 142/82  132/76  Pulse:  87  97  Resp:  18  18  Temp:  98.7 F (37.1 C)  97.6 F (36.4 C)  TempSrc:  Oral  Oral  SpO2: 96% 98% 97% 99%  Weight:      Height:        Intake/Output Summary (Last 24 hours) at 10/16/16 1449 Last data filed at 10/16/16 1300  Gross per 24 hour  Intake             1420 ml  Output              600 ml  Net              820 ml   Filed Weights   10/14/16 0315 10/15/16 0423 10/15/16 0745  Weight: 81.4 kg (179 lb 8 oz) 81.3 kg (179 lb 3.2 oz) 81.3 kg (179 lb 3.2 oz)    Examination:  General exam: not in pain or dyspnea E ENT: no pallor or icterus Respiratory system: scattered rhonchi, no wheezing.  Respiratory effort normal. Cardiovascular system: S1 & S2 heard, RRR. No JVD, murmurs, rubs, gallops or clicks. No pedal edema. Gastrointestinal system: Abdomen is nondistended, soft and nontender. No organomegaly or masses felt. Normal bowel sounds heard. Central nervous system: Alert and oriented. No focal neurological deficits.  Extremities: Symmetric 5 x 5 power. Skin: No rashes, lesions or ulcers     Data Reviewed: I have personally reviewed following labs and imaging studies  CBC:  Recent Labs Lab 10/14/16 0141 10/14/16 0621 10/14/16 0926 10/15/16 0227 10/16/16 0231  WBC 6.6 6.3 6.6 6.0 4.8  NEUTROABS  --   --   --   --  3.1  HGB 7.6* 7.6* 7.5* 8.8* 8.7*  HCT 25.4* 25.2* 25.0* 28.5* 28.0*  MCV 86.1 86.3 86.2 85.1 85.6  PLT 360 341 319 316 193   Basic Metabolic Panel:  Recent Labs Lab 10/13/16 2104 10/14/16 0141 10/16/16 0231  NA 138 138 141  K 3.5 3.6 3.0*  CL 101 101 104  CO2 24 25 25   GLUCOSE 121* 105* 138*  BUN 11 10 6   CREATININE 0.87 0.80 0.79  CALCIUM 9.4 9.6 9.2   GFR: Estimated Creatinine  Clearance: 73.2 mL/min (by C-G formula based on SCr of 0.79 mg/dL). Liver Function Tests:  Recent Labs Lab 10/13/16 2104  AST 22  ALT 10*  ALKPHOS 112  BILITOT 0.1*  PROT 6.6  ALBUMIN 3.0*    Recent Labs Lab 10/13/16 2104  LIPASE 19   No results for input(s): AMMONIA in the last 168 hours. Coagulation Profile:  Recent Labs Lab 10/14/16 1230  INR 1.08   Cardiac Enzymes: No results for input(s): CKTOTAL, CKMB, CKMBINDEX, TROPONINI in the last 168 hours. BNP (last 3 results)  Recent Labs  02/07/16 1224  PROBNP 139.0*   HbA1C: No results for input(s): HGBA1C in the last 72 hours. CBG:  Recent Labs Lab 10/15/16 2037 10/16/16 0050 10/16/16 0428 10/16/16 0754 10/16/16 1117  GLUCAP 113* 119* 115* 144* 163*   Lipid Profile: No results for input(s): CHOL, HDL, LDLCALC, TRIG, CHOLHDL, LDLDIRECT in the last 72 hours. Thyroid Function Tests: No results for input(s): TSH, T4TOTAL, FREET4, T3FREE, THYROIDAB in the last 72 hours. Anemia Panel:  Recent Labs  10/14/16 0141  VITAMINB12 389  FOLATE 9.3  FERRITIN 9*  TIBC 301  IRON 24*  RETICCTPCT 2.5   Sepsis Labs: No results for input(s): PROCALCITON, LATICACIDVEN in the last 168 hours.  Recent Results (from the past 240 hour(s))  MRSA PCR Screening     Status: None   Collection Time: 10/14/16  3:25 AM  Result Value Ref Range Status   MRSA by PCR NEGATIVE NEGATIVE Final    Comment:        The GeneXpert MRSA Assay (FDA approved for NASAL specimens only), is one component of a comprehensive MRSA colonization surveillance program. It is not intended to diagnose MRSA infection nor to guide or monitor treatment for MRSA infections.   Culture, blood (routine x 2) Call MD if unable to obtain prior to antibiotics being given     Status: None (Preliminary result)   Collection Time: 10/14/16  4:02 AM  Result Value Ref Range Status   Specimen Description BLOOD RIGHT HAND  Final   Special Requests   Final     BOTTLES DRAWN AEROBIC AND ANAEROBIC 4CC RED 5CC BLUE   Culture NO GROWTH 1 DAY  Final   Report Status PENDING  Incomplete  Culture, blood (routine x 2) Call MD if unable to obtain prior to antibiotics being given     Status: None (Preliminary result)   Collection Time: 10/14/16  4:13 AM  Result Value Ref Range Status   Specimen Description BLOOD LEFT ARM  Final   Special Requests   Final  BOTTLES DRAWN AEROBIC AND ANAEROBIC 3CC RED 4CC BLUE   Culture NO GROWTH 1 DAY  Final   Report Status PENDING  Incomplete  Culture, sputum-assessment     Status: None   Collection Time: 10/14/16  2:03 PM  Result Value Ref Range Status   Specimen Description SPUTUM  Final   Special Requests NONE  Final   Sputum evaluation THIS SPECIMEN IS ACCEPTABLE FOR SPUTUM CULTURE  Final   Report Status 10/14/2016 FINAL  Final  Culture, respiratory (NON-Expectorated)     Status: None (Preliminary result)   Collection Time: 10/14/16  2:03 PM  Result Value Ref Range Status   Specimen Description SPUTUM  Final   Special Requests NONE Reflexed from [id number]  Final   Gram Stain   Final    ABUNDANT WBC PRESENT,BOTH PMN AND MONONUCLEAR ABUNDANT GRAM POSITIVE RODS FEW GRAM NEGATIVE RODS RARE GRAM POSITIVE COCCI IN PAIRS RARE YEAST    Culture CULTURE REINCUBATED FOR BETTER GROWTH  Final   Report Status PENDING  Incomplete         Radiology Studies: US Abdomen Limited Ruq  Result Date: 10/14/2016 CLINICAL DATA:  Right upper quadrant abdominal pain for 1 month. History of renal transplant. Dialysis patient. Status post cholecystectomy. EXAM: US ABDOMEN LIMITED - RIGHT UPPER QUADRANT COMPARISON:  CT scan 07/28/2014 FINDINGS: Gallbladder: Surgically absent. Common bile duct: Diameter: 4.8 mm Liver: The liver contour is irregular and the left hepatic lobe appears enlarged. Findings suggestive of cirrhosis. No focal hepatic lesions or intrahepatic biliary dilatation. The liver echotexture is mildly coarse. IMPRESSION:  Ultrasound findings suggest cirrhotic changes involving the liver. No focal hepatic lesions. Status post cholecystectomy.  No biliary dilatation. Electronically Signed   By: Marijo Sanes M.D.   On: 10/14/2016 19:02        Scheduled Meds: . sodium chloride   Intravenous Once  . azaTHIOprine  125 mg Oral QPM  . calcitRIOL  0.25 mcg Oral q morning - 10a  . carbamazepine  200 mg Oral BID  . cyclobenzaprine  10 mg Oral BID  . diltiazem  120 mg Oral Daily  . DULoxetine  60 mg Oral q morning - 10a  . fluconazole (DIFLUCAN) IV  200 mg Intravenous Q24H  . fluticasone  2 spray Each Nare Daily  . insulin aspart  0-9 Units Subcutaneous Q4H  . levofloxacin (LEVAQUIN) IV  500 mg Intravenous Q24H  . LORazepam  1 mg Oral q morning - 10a  . mometasone-formoterol  2 puff Inhalation BID  . montelukast  10 mg Oral QHS  . pantoprazole (PROTONIX) IV  40 mg Intravenous Q12H  . predniSONE  5 mg Oral Q breakfast  . rosuvastatin  20 mg Oral q1800  . sertraline  100 mg Oral q morning - 10a   Continuous Infusions: . sodium chloride       LOS: 2 days        Katherine Stroope Gerome Apley, MD [facility] Pager [phone]  If 7PM-7AM, please contact night-coverage [email or web address] Password [facility] 10/16/2016, 2:49 PM

## 2016-10-16 NOTE — Progress Notes (Addendum)
[facility] Gastroenterology Progress Note  Katherine [name] 61 y.o. 1955-11-09  CC:   GI bleed.   Subjective: Patient was scheduled for colonoscopy today. Patient has not finished her prep. Discussed with the nursing staff. No bowel movement since yesterday. Patient actually spits up her colonoscopy prep and she is not actually drinking it. No evidence of active bleeding. Hemoglobin stable.  ROS :  Negative for abdominal pain. Negative for active GI bleeding.   Objective: Vital signs in last 24 hours: Vitals:   10/15/16 2040 10/16/16 0432  BP: (!) 162/87 (!) 142/82  Pulse: 61 87  Resp: 18 18  Temp: 99.1 F (37.3 C) 98.7 F (37.1 C)    Physical Exam:  General:    Well-developed, well-nourished, pleasant and cooperative in NAD HEENT : NS, AT , EOMI.  No scleral icterus Lungs:  Fine bibasilar rhonchi. No acute distress. Heart:  Regular rate and rhythm; murmur noted Abdomen: soft, Nontender nondistended, bowel sounds present. No peritoneal signs. No rebound LE : no edema  Psych : alert oriented 3. Mood and affect normal.    Lab Results:  Recent Labs  10/14/16 0141 10/16/16 0231  NA 138 141  K 3.6 3.0*  CL 101 104  CO2 25 25  GLUCOSE 105* 138*  BUN 10 6  CREATININE 0.80 0.79  CALCIUM 9.6 9.2    Recent Labs  10/13/16 2104  AST 22  ALT 10*  ALKPHOS 112  BILITOT 0.1*  PROT 6.6  ALBUMIN 3.0*    Recent Labs  10/15/16 0227 10/16/16 0231  WBC 6.0 4.8  NEUTROABS  --  3.1  HGB 8.8* 8.7*  HCT 28.5* 28.0*  MCV 85.1 85.6  PLT 316 296    Recent Labs  10/14/16 1230  LABPROT 14.0  INR 1.08      Assessment/Plan: - Symptomatic anemia. Iron deficiency anemia. Status post EGD yesterday. No evidence of active bleeding. Biopsy pending - History of coronary artery disease status post CABG and multiple PCI. Currently on Plavix. Last dose was yesterday. No active chest pain.  - Abnormal CT scan showing multiple lung nodules  suspicious for infection. - ??  Early cirrhosis based on CT chest . Normal LFTs. Normal platelets.[name] Kitchen Ultrasound showed findings suggestive of cirrhotic changes. EGD yesterday did not show any varices. - Personal history of tubular adenoma during colonoscopy in 2012. Multiple no shows after that in GI clinic - Status post kidney transplant for end-stage renal disease. On Imuran. - COPD  Recommendation ----------------------- - EGD showed possible esophageal candidiasis and polypoid mass in the duodenum. Biopsy pending. - Patient with no bowel movement since yesterday. She was scheduled for colonoscopy today but has not drink prep. Patient is spitting up her prep according to nursing staff. Hemoglobin remained stable. Occult blood negative on admission. - Discuss with patient in detail. We'll start liquid diet. Monitor hemoglobin. Recommend outpatient colonoscopy. - ok to resume Plavix from GI standpoint - Patient was advised to follow up with GI in 2-4 weeks after discharge to schedule colonoscopy as well as for further workup of cirrhosis - GI will sign off. Call us back if needed.     Otis Brace MD, FACP 10/16/2016, 10:00 AM  Pager [phone]  If no answer or after 5 PM call [phone]

## 2016-10-17 DIAGNOSIS — I2510 Atherosclerotic heart disease of native coronary artery without angina pectoris: Secondary | ICD-10-CM

## 2016-10-17 DIAGNOSIS — Z9861 Coronary angioplasty status: Secondary | ICD-10-CM

## 2016-10-17 DIAGNOSIS — Z951 Presence of aortocoronary bypass graft: Secondary | ICD-10-CM

## 2016-10-17 DIAGNOSIS — D508 Other iron deficiency anemias: Secondary | ICD-10-CM

## 2016-10-17 LAB — CBC
HEMATOCRIT: 26.5 % — AB (ref 36.0–46.0)
HEMOGLOBIN: 8.3 g/dL — AB (ref 12.0–15.0)
MCH: 26.3 pg (ref 26.0–34.0)
MCHC: 31.3 g/dL (ref 30.0–36.0)
MCV: 84.1 fL (ref 78.0–100.0)
Platelets: 287 10*3/uL (ref 150–400)
RBC: 3.15 MIL/uL — ABNORMAL LOW (ref 3.87–5.11)
RDW: 16.9 % — AB (ref 11.5–15.5)
WBC: 3.7 10*3/uL — ABNORMAL LOW (ref 4.0–10.5)

## 2016-10-17 LAB — BASIC METABOLIC PANEL
Anion gap: 12 (ref 5–15)
CO2: 25 mmol/L (ref 22–32)
Calcium: 9.2 mg/dL (ref 8.9–10.3)
Chloride: 103 mmol/L (ref 101–111)
Creatinine, Ser: 0.75 mg/dL (ref 0.44–1.00)
GFR calc Af Amer: >60 mL/min (ref >60)
GLUCOSE: 155 mg/dL — AB (ref 65–99)
Potassium: 2.8 mmol/L — ABNORMAL LOW (ref 3.5–5.1)
SODIUM: 140 mmol/L (ref 135–145)

## 2016-10-17 LAB — CULTURE, RESPIRATORY: CULTURE: NORMAL

## 2016-10-17 LAB — GLUCOSE, CAPILLARY
GLUCOSE-CAPILLARY: 153 mg/dL — AB (ref 65–99)
GLUCOSE-CAPILLARY: 159 mg/dL — AB (ref 65–99)
Glucose-Capillary: 185 mg/dL — ABNORMAL HIGH (ref 65–99)
Glucose-Capillary: 153 mg/dL — ABNORMAL HIGH (ref 65–99)

## 2016-10-17 LAB — MAGNESIUM: MAGNESIUM: 1.5 mg/dL — AB (ref 1.7–2.4)

## 2016-10-17 LAB — CULTURE, RESPIRATORY W GRAM STAIN

## 2016-10-17 LAB — TROPONIN I: TROPONIN I: 0.03 ng/mL — AB (ref <0.03)

## 2016-10-17 MED ORDER — ACETAMINOPHEN 500 MG PO TABS: 500.0000 mg | ORAL_TABLET | Freq: Four times a day (QID) | ORAL | 0 refills | Status: DC | PRN

## 2016-10-17 MED ORDER — FLUCONAZOLE 100 MG PO TABS: 200.0000 mg | ORAL_TABLET | Freq: Every day | ORAL | 0 refills | Status: DC

## 2016-10-17 MED ORDER — POTASSIUM CHLORIDE CRYS ER 20 MEQ PO TBCR
40.0000 meq | EXTENDED_RELEASE_TABLET | ORAL | Status: AC
  Administered 2016-10-17 (×2): 40 meq via ORAL
  Filled 2016-10-17 (×2): qty 2

## 2016-10-17 MED ORDER — CARBAMAZEPINE 200 MG PO TABS: 200.0000 mg | ORAL_TABLET | Freq: Two times a day (BID) | ORAL | 0 refills | Status: DC

## 2016-10-17 MED ORDER — FERROUS SULFATE 325 (65 FE) MG PO TABS: 325.0000 mg | ORAL_TABLET | Freq: Three times a day (TID) | ORAL | 0 refills | Status: DC

## 2016-10-17 NOTE — Progress Notes (Signed)
Patient ID: Katherine [name], female   DOB: 07/19/1956, 61 y.o.   MRN: [id number] The patient had precordial chest pain. Chart reviewed history of CAD/CABG. Admitted for GI bleed/symptomatic anemia. EKG no ST segment or T-wave changes. The patient declined sublingual nitroglycerin due to headache, she prefers to take her oral oxycodone for pain. Troponin, BMP, CBC and magnesium ordered. We will follow.  Tennis Must, M.D. [phone]

## 2016-10-17 NOTE — Discharge Summary (Signed)
Physician Discharge Summary  Katherine [name] SEG:[id number] DOB: 09-21-1955 DOA: 10/13/2016  PCP: Placido Sou, MD  Admit date: 10/13/2016 Discharge date: 10/17/2016  Admitted From:  Home  Disposition:  Home   Recommendations for Outpatient Follow-up:  1. Follow up with PCP in 1- week 2. Follow with pulmonary in 7 days 3. Sputum AFB and fungal are pending 4. Duodenal polyp biopsy pending  Home Health: No  Equipment/Devices: No   Discharge Condition: stable  CODE STATUS: Full  Diet recommendation: Heart Healthy / Carb Modified   Brief/Interim Summary: This is a 61 year old female who presented to hospital with chief complaint of fatigue, weakness and cough. Her weakness had been worsening for last 2 weeks, associated with epigastric discomfort, and melena. On initial physical examination blood pressure 123/64, heart rate 69, respiration rate 20, oxygen saturation 99%. Mild pallor, lungs were clear to auscultation bilaterally, heart S1-S2 present rhythmic, abdomen soft nontender, lower extremity no edema. Sodium 138, potassium 3.5, chloride 11, bicarbonate 24, glucose 121, BUN 11, creatinine 0.87, white count 6.9, Hb 10.6, hematocrit 25.3, platelets 359. Urinalysis negative for infection, fecal occult blood was negative. Chest x-ray with multiple lung nodules bilaterally, CT chest confirmed his findings. EKG was normal sinus rhythm.  Patient was admitted to the medical floor with working diagnosis of symptomatic anemia likely from upper GI bleed, complicated by pulmonary nodules.  1. Symptomatic anemia due to upper GI bleed. Patient was admitted to the medical floor, patient received 1 unit of packed red blood cells with stabilization of her hemoglobin. No further bleeding. Patient underwent upper endoscopy findings of Candida esophagitis, patient will be discharged on Diflucan., acute gastritis, duodenal polyp which was biopsied. Patient will be discharged on proton pump  inhibitors.  2. Anemia with iron deficiency. Serum iron was 24 with transferring saturation of 8 and ferritin of 9 consistent with severe iron deficiency anemia. Patient was placed on oral iron supplementation, will outpatient follow-up on iron stores.  3. Coronary disease status post bypass graft. No angina. Continue statin and plavix therapy.  4. Lung nodules. Apparently new finding, patient had sputum sent for culture, AFB and fungal. Patient immunosuppressed, and at risk for pulmonary oppurtinistic infections. Pulmonary and  infection disease were contacted. Patient will follow-up as an outpatient, she may need diagnostic bronchoscopy. Currently no significant dyspnea, oxygen saturation 98% on room air. Patient has remained afebrile.  5. Asthma. Stable with no signs of exacerbation, continue with bronchodilator therapy per home regimen.  6. Paroxysmal atrial fibrillation. Patient will continue on diltiazem for rate control. Currently off anticoagulation.  7. Depression. Continue duloxetine, Tegretol, lorazepam and sertraline.  8. Type 2 diabetes mellitus. Patient was placed on insulin sliding scale for glucose coverage and monitoring, Her capillary glucose remained well-controlled, patient will resume oral hypoglycemic agents at discharge.  9. Status post renal transplant. Patient will continue on immunosuppressive therapy with azathioprine and prednisone.  10. Candida esophagitis. Documented per upper endoscopy, patient receive IV Diflucan in the hospital and will switch to by mouth Diflucan time of discharge, for 14 days.    Discharge Diagnoses:  Principal Problem:   Symptomatic anemia Active Problems:   Sleep apnea   Morbid obesity - s/p Lap Band 9/'05   S/P CABG (coronary artery bypass graft) x 3   ST elevation myocardial infarction (STEMI) of inferoposterior wall; H/o -- Rescue PTCA of RCA -- referred for CABG   CAD S/P percutaneous coronary angioplasty - PCI x 5 to SVG-D1    Hypertension associated with diabetes (  [place])   PAD (peripheral artery disease) (HCC)   PAF (paroxysmal atrial fibrillation) (HCC)   Acute GI bleeding   Lung nodules   Anemia    Discharge Instructions   Allergies as of 10/17/2016      Reactions   Codeine Nausea And Vomiting   Erythromycin Nausea And Vomiting   Hydromorphone Hcl Nausea And Vomiting   Morphine And Related Nausea And Vomiting   Nalbuphine Nausea And Vomiting   Niaspan [niacin Er] Other (See Comments)   Mouth blisters   Sulfonamide Derivatives Other (See Comments)   Reaction: per patient "tears her stomach up"   Tetracycline Hives      Medication List    STOP taking these medications   diphenhydramine-acetaminophen 25-500 MG Tabs tablet Commonly known as:  TYLENOL PM     TAKE these medications   acetaminophen 500 MG tablet Commonly known as:  TYLENOL Take 1 tablet (500 mg total) by mouth every 6 (six) hours as needed for moderate pain or headache. What changed:  how much to take   azaTHIOprine 50 MG tablet Commonly known as:  IMURAN Take 125 mg by mouth every evening.   budesonide-formoterol 160-4.5 MCG/ACT inhaler Commonly known as:  SYMBICORT Inhale 2 puffs into the lungs 2 (two) times daily.   calcitRIOL 0.25 MCG capsule Commonly known as:  ROCALTROL Take 0.25 mcg by mouth every morning.   carbamazepine 200 MG tablet Commonly known as:  TEGRETOL Take 1 tablet (200 mg total) by mouth 2 (two) times daily.   clopidogrel 75 MG tablet Commonly known as:  PLAVIX TAKE 1 TABLET (75 MG TOTAL) BY MOUTH DAILY.   cyclobenzaprine 10 MG tablet Commonly known as:  FLEXERIL Take 10 mg by mouth 2 (two) times daily.   diltiazem 120 MG 24 hr capsule Commonly known as:  CARDIZEM CD Take 1 capsule (120 mg total) by mouth daily. NEED OV.   DULoxetine 30 MG capsule Commonly known as:  CYMBALTA TAKE 2 CAPSULES (60 MG TOTAL) BY MOUTH EVERY MORNING.   ferrous sulfate 325 (65 FE) MG tablet Take 1 tablet (325  mg total) by mouth 3 (three) times daily with meals.   fluconazole 100 MG tablet Commonly known as:  DIFLUCAN Take 2 tablets (200 mg total) by mouth daily.   fluticasone 50 MCG/ACT nasal spray Commonly known as:  FLONASE PLACE 2 SPRAYS INTO THE NOSE DAILY   glimepiride 4 MG tablet Commonly known as:  AMARYL Take 4 mg by mouth every morning.   LORazepam 1 MG tablet Commonly known as:  ATIVAN Take 1 mg by mouth every morning.   metFORMIN 500 MG tablet Commonly known as:  GLUCOPHAGE Take 500 mg by mouth 2 (two) times daily.   metoprolol tartrate 25 MG tablet Commonly known as:  LOPRESSOR Take half tablet (12.5 mg total) by mouth daily. Take whole tablet (25 mg total) by mouth daily if SBP > 150. What changed:  how much to take  how to take this  when to take this  reasons to take this  additional instructions   montelukast 10 MG tablet Commonly known as:  SINGULAIR TAKE 1 TABLET (10 MG TOTAL) BY MOUTH DAILY.   nitroGLYCERIN 0.4 MG SL tablet Commonly known as:  NITROSTAT Place 1 tablet (0.4 mg total) under the tongue every 5 (five) minutes as needed for chest pain.   omeprazole 40 MG capsule Commonly known as:  PRILOSEC Take 40 mg by mouth every morning.   oxyCODONE 5 MG immediate release tablet  Commonly known as:  Oxy IR/ROXICODONE Take 1 tablet (5 mg total) by mouth every 8 (eight) hours as needed for severe pain.   predniSONE 5 MG tablet Commonly known as:  DELTASONE Take 5 mg by mouth every morning.   promethazine 25 MG tablet Commonly known as:  PHENERGAN Take 25 mg by mouth at bedtime as needed for nausea or vomiting. For nausea and sleep   rosuvastatin 20 MG tablet Commonly known as:  CRESTOR TAKE 1 TABLET (20 MG TOTAL) BY MOUTH DAILY.   sertraline 100 MG tablet Commonly known as:  ZOLOFT Take 100 mg by mouth every morning.   VENTOLIN HFA 108 (90 Base) MCG/ACT inhaler Generic drug:  albuterol INHALE 2 PUFFS INTO THE LUNGS EVERY 6 (SIX) HOURS  AS NEEDED FOR WHEEZING OR SHORTNESS OF BREATH.      Follow-up Information    DETERDING,JAMES L, MD Follow up in 1 week(s).   Specialty:  Nephrology Contact information: [place] [address] [phone]        Collene Gobble., MD Follow up in 1 week(s).   Specialty:  Pulmonary Disease Contact information: [address] [place] [address] [phone]          Allergies  Allergen Reactions  . Codeine Nausea And Vomiting  . Erythromycin Nausea And Vomiting  . Hydromorphone Hcl Nausea And Vomiting  . Morphine And Related Nausea And Vomiting  . Nalbuphine Nausea And Vomiting  . Niaspan [Niacin Er] Other (See Comments)    Mouth blisters  . Sulfonamide Derivatives Other (See Comments)    Reaction: per patient "tears her stomach up"  . Tetracycline Hives    Consultations:  Gastroenterology  I.D  Pulmonary over the phone   Procedures/Studies: Dg Chest 2 View  Result Date: 10/13/2016 CLINICAL DATA:  Palpitations and shortness of breath. EXAM: CHEST  2 VIEW COMPARISON:  08/31/2016 FINDINGS: Post median sternotomy and CABG. Unchanged heart size and mediastinal contours, heart upper limits of normal. Probable hiatal hernia. Development of multifocal ill-defined opacities within both lungs, right greater than left, some of which appear nodular. Streaky scarring in the left mid lung. Gastric band is faintly visualized. No acute osseous abnormalities are seen. IMPRESSION: Multifocal opacities in both lungs that appear nodule or, right greater than left. These appear new from exam 6 weeks prior. This may be infectious or inflammatory, however CT characterization may be helpful. Electronically Signed   By: Jeb Levering M.D.   On: 10/13/2016 20:26   Ct Chest Wo Contrast  Result Date: 10/14/2016 CLINICAL DATA:  Assess pulmonary nodules noted on chest radiograph. Initial encounter. EXAM: CT CHEST WITHOUT CONTRAST TECHNIQUE: Multidetector CT imaging of the  chest was performed following the standard protocol without IV contrast. COMPARISON:  Chest radiograph from 10/13/2016 FINDINGS: Cardiovascular: The patient is status post median sternotomy. Diffuse coronary artery calcifications are seen. The heart remains normal in size. Scattered calcification is noted along the aortic arch and descending thoracic aorta. Mild calcification is seen at the proximal great vessels. Mediastinum/Nodes: The esophagus is filled with fluid, raising question for esophageal dysmotility or gastroesophageal reflux. Scattered calcifications and heterogeneity are noted at the thyroid gland. No dominant mass is seen. No mediastinal lymphadenopathy is seen. No pericardial effusion is identified. No axillary lymphadenopathy is identified. Lungs/Pleura: Multiple scattered nodules and associated tiny opacities are noted throughout both lungs, more prominent on the right side. Given that these are new from January, these are most likely infectious in nature. No pleural effusion or pneumothorax  is seen. No dominant mass is identified. Upper Abdomen: The mildly nodular contour of the liver raises question for mild hepatic cirrhosis. Peripheral calcification is seen at the spleen, possibly reflecting remote injury. The patient is status post cholecystectomy, with clips noted at the gallbladder fossa. The visualized portions of the pancreas and adrenal glands are within normal limits. There is chronic severe bilateral renal atrophy. A gastric band is noted. Musculoskeletal: No acute osseous abnormalities are identified. Mild multilevel vacuum phenomenon is noted along the thoracic spine. The visualized musculature is unremarkable in appearance. IMPRESSION: 1. Scattered nodules and associated tiny opacities within both lungs, more prominent on the right. Given that these are new from January, they are most likely infectious in nature. Would suggest Pulmonary consultation for further evaluation, to assess  the underlying infectious etiology. 2. Esophagus filled with fluid, raising question for esophageal dysmotility or gastroesophageal reflux. 3. Diffuse coronary artery calcifications seen. 4. Suggestion of mild hepatic cirrhosis. 5. Chronic severe bilateral renal atrophy. Electronically Signed   By: Garald Balding M.D.   On: 10/14/2016 02:58   US Abdomen Limited Ruq  Result Date: 10/14/2016 CLINICAL DATA:  Right upper quadrant abdominal pain for 1 month. History of renal transplant. Dialysis patient. Status post cholecystectomy. EXAM: US ABDOMEN LIMITED - RIGHT UPPER QUADRANT COMPARISON:  CT scan 07/28/2014 FINDINGS: Gallbladder: Surgically absent. Common bile duct: Diameter: 4.8 mm Liver: The liver contour is irregular and the left hepatic lobe appears enlarged. Findings suggestive of cirrhosis. No focal hepatic lesions or intrahepatic biliary dilatation. The liver echotexture is mildly coarse. IMPRESSION: Ultrasound findings suggest cirrhotic changes involving the liver. No focal hepatic lesions. Status post cholecystectomy.  No biliary dilatation. Electronically Signed   By: Marijo Sanes M.D.   On: 10/14/2016 19:02       Subjective: Patient feeling well, last night had chest pain, improved with pain medication. This am no dyspnea or melena, no chest pain no nausea or vomiting.   Discharge Exam: Vitals:   10/17/16 0045 10/17/16 0435  BP: 128/64 132/60  Pulse: 73 81  Resp: 16 16  Temp:  98.4 F (36.9 C)   Vitals:   10/16/16 2102 10/17/16 0045 10/17/16 0435 10/17/16 0854  BP: 107/65 128/64 132/60   Pulse: 83 73 81   Resp: 18 16 16    Temp: 98.1 F (36.7 C)  98.4 F (36.9 C)   TempSrc: Oral  Oral   SpO2: 100% 95% 94% 98%  Weight:   84.6 kg (186 lb 8 oz)   Height:        General: Pt is alert, awake, not in acute distress Cardiovascular: RRR, S1/S2 +, no rubs, no gallops Respiratory: CTA bilaterally, no wheezing, no rhonchi Abdominal: Soft, NT, ND, bowel sounds + Extremities: no  edema, no cyanosis    The results of significant diagnostics from this hospitalization (including imaging, microbiology, ancillary and laboratory) are listed below for reference.     Microbiology: Recent Results (from the past 240 hour(s))  MRSA PCR Screening     Status: None   Collection Time: 10/14/16  3:25 AM  Result Value Ref Range Status   MRSA by PCR NEGATIVE NEGATIVE Final    Comment:        The GeneXpert MRSA Assay (FDA approved for NASAL specimens only), is one component of a comprehensive MRSA colonization surveillance program. It is not intended to diagnose MRSA infection nor to guide or monitor treatment for MRSA infections.   Culture, blood (routine x 2) Call MD if  unable to obtain prior to antibiotics being given     Status: None (Preliminary result)   Collection Time: 10/14/16  4:02 AM  Result Value Ref Range Status   Specimen Description BLOOD RIGHT HAND  Final   Special Requests   Final    BOTTLES DRAWN AEROBIC AND ANAEROBIC 4CC RED 5CC BLUE   Culture NO GROWTH 2 DAYS  Final   Report Status PENDING  Incomplete  Culture, blood (routine x 2) Call MD if unable to obtain prior to antibiotics being given     Status: None (Preliminary result)   Collection Time: 10/14/16  4:13 AM  Result Value Ref Range Status   Specimen Description BLOOD LEFT ARM  Final   Special Requests   Final    BOTTLES DRAWN AEROBIC AND ANAEROBIC 3CC RED 4CC BLUE   Culture NO GROWTH 2 DAYS  Final   Report Status PENDING  Incomplete  Culture, sputum-assessment     Status: None   Collection Time: 10/14/16  2:03 PM  Result Value Ref Range Status   Specimen Description SPUTUM  Final   Special Requests NONE  Final   Sputum evaluation THIS SPECIMEN IS ACCEPTABLE FOR SPUTUM CULTURE  Final   Report Status 10/14/2016 FINAL  Final  Culture, respiratory (NON-Expectorated)     Status: None   Collection Time: 10/14/16  2:03 PM  Result Value Ref Range Status   Specimen Description SPUTUM  Final    Special Requests NONE Reflexed from [id number]  Final   Gram Stain   Final    ABUNDANT WBC PRESENT,BOTH PMN AND MONONUCLEAR ABUNDANT GRAM POSITIVE RODS FEW GRAM NEGATIVE RODS RARE GRAM POSITIVE COCCI IN PAIRS RARE YEAST    Culture Consistent with normal respiratory flora.  Final   Report Status 10/17/2016 FINAL  Final     Labs: BNP (last 3 results) No results for input(s): BNP in the last 8760 hours. Basic Metabolic Panel:  Recent Labs Lab 10/13/16 2104 10/14/16 0141 10/16/16 0231 10/17/16 0222  NA 138 138 141 140  K 3.5 3.6 3.0* 2.8*  CL 101 101 104 103  CO2 24 25 25 25   GLUCOSE 121* 105* 138* 155*  BUN 11 10 6  <5*  CREATININE 0.87 0.80 0.79 0.75  CALCIUM 9.4 9.6 9.2 9.2  MG  --   --   --  1.5*   Liver Function Tests:  Recent Labs Lab 10/13/16 2104  AST 22  ALT 10*  ALKPHOS 112  BILITOT 0.1*  PROT 6.6  ALBUMIN 3.0*    Recent Labs Lab 10/13/16 2104  LIPASE 19   No results for input(s): AMMONIA in the last 168 hours. CBC:  Recent Labs Lab 10/14/16 0621 10/14/16 0926 10/15/16 0227 10/16/16 0231 10/17/16 0222  WBC 6.3 6.6 6.0 4.8 3.7*  NEUTROABS  --   --   --  3.1  --   HGB 7.6* 7.5* 8.8* 8.7* 8.3*  HCT 25.2* 25.0* 28.5* 28.0* 26.5*  MCV 86.3 86.2 85.1 85.6 84.1  PLT 341 319 316 296 287   Cardiac Enzymes:  Recent Labs Lab 10/17/16 0222  TROPONINI 0.03*   BNP: Invalid input(s): POCBNP CBG:  Recent Labs Lab 10/16/16 1647 10/16/16 2048 10/17/16 0007 10/17/16 0433 10/17/16 0807  GLUCAP 111* 123* 159* 153* 185*   D-Dimer No results for input(s): DDIMER in the last 72 hours. Hgb A1c No results for input(s): HGBA1C in the last 72 hours. Lipid Profile No results for input(s): CHOL, HDL, LDLCALC, TRIG, CHOLHDL, LDLDIRECT in the last  72 hours. Thyroid function studies No results for input(s): TSH, T4TOTAL, T3FREE, THYROIDAB in the last 72 hours.  Invalid input(s): FREET3 Anemia work up No results for input(s): VITAMINB12, FOLATE,  FERRITIN, TIBC, IRON, RETICCTPCT in the last 72 hours. Urinalysis    Component Value Date/Time   COLORURINE AMBER (A) 10/13/2016 2240   APPEARANCEUR CLEAR 10/13/2016 2240   LABSPEC 1.018 10/13/2016 2240   PHURINE 5.0 10/13/2016 2240   GLUCOSEU NEGATIVE 10/13/2016 2240   HGBUR NEGATIVE 10/13/2016 2240   BILIRUBINUR NEGATIVE 10/13/2016 2240   KETONESUR 5 (A) 10/13/2016 2240   PROTEINUR NEGATIVE 10/13/2016 2240   UROBILINOGEN 0.2 10/16/2013 0110   NITRITE NEGATIVE 10/13/2016 2240   LEUKOCYTESUR NEGATIVE 10/13/2016 2240   Sepsis Labs Invalid input(s): PROCALCITONIN,  WBC,  LACTICIDVEN Microbiology Recent Results (from the past 240 hour(s))  MRSA PCR Screening     Status: None   Collection Time: 10/14/16  3:25 AM  Result Value Ref Range Status   MRSA by PCR NEGATIVE NEGATIVE Final    Comment:        The GeneXpert MRSA Assay (FDA approved for NASAL specimens only), is one component of a comprehensive MRSA colonization surveillance program. It is not intended to diagnose MRSA infection nor to guide or monitor treatment for MRSA infections.   Culture, blood (routine x 2) Call MD if unable to obtain prior to antibiotics being given     Status: None (Preliminary result)   Collection Time: 10/14/16  4:02 AM  Result Value Ref Range Status   Specimen Description BLOOD RIGHT HAND  Final   Special Requests   Final    BOTTLES DRAWN AEROBIC AND ANAEROBIC 4CC RED 5CC BLUE   Culture NO GROWTH 2 DAYS  Final   Report Status PENDING  Incomplete  Culture, blood (routine x 2) Call MD if unable to obtain prior to antibiotics being given     Status: None (Preliminary result)   Collection Time: 10/14/16  4:13 AM  Result Value Ref Range Status   Specimen Description BLOOD LEFT ARM  Final   Special Requests   Final    BOTTLES DRAWN AEROBIC AND ANAEROBIC 3CC RED 4CC BLUE   Culture NO GROWTH 2 DAYS  Final   Report Status PENDING  Incomplete  Culture, sputum-assessment     Status: None    Collection Time: 10/14/16  2:03 PM  Result Value Ref Range Status   Specimen Description SPUTUM  Final   Special Requests NONE  Final   Sputum evaluation THIS SPECIMEN IS ACCEPTABLE FOR SPUTUM CULTURE  Final   Report Status 10/14/2016 FINAL  Final  Culture, respiratory (NON-Expectorated)     Status: None   Collection Time: 10/14/16  2:03 PM  Result Value Ref Range Status   Specimen Description SPUTUM  Final   Special Requests NONE Reflexed from [id number]  Final   Gram Stain   Final    ABUNDANT WBC PRESENT,BOTH PMN AND MONONUCLEAR ABUNDANT GRAM POSITIVE RODS FEW GRAM NEGATIVE RODS RARE GRAM POSITIVE COCCI IN PAIRS RARE YEAST    Culture Consistent with normal respiratory flora.  Final   Report Status 10/17/2016 FINAL  Final     Time coordinating discharge: 45 minutes  SIGNED:   Tawni Millers, MD  [facility] 10/17/2016, 11:06 AM Pager   If 7PM-7AM, please contact night-coverage [email or web address] Password [email or web address]

## 2016-10-17 NOTE — Progress Notes (Signed)
Order received to discharge patient.  Telemetry monitor removed and CCMD notified.  PIV access removed.  Discharge instructions, follow up, medications and instructions for their use were discussed with patient. 

## 2016-10-17 NOTE — Progress Notes (Signed)
Pt reported 4/10 chest pain, describes senestion as smiliar to previous MI, though not as intense. Describes pain being "similar to when you breath in really cold air". Pain is felt on left and right chest and does not radiate. VSS, EKG obtained. Pt refuses nitro d/t current headache. Pt also refuses IV morphine or dilaudid d/t hx of nausea/vomitting associated with these medications. [facility] on-call notified, instructed to give pt's prn oxycodone 5mg . Pt now reports chest pain is going away while speaking to MD on phone. New labs ordered. Will continue to closely monitor patient.   Jaymes Graff, RN

## 2016-10-19 ENCOUNTER — Ambulatory Visit: Payer: Medicare Other | Admitting: Physical Medicine & Rehabilitation

## 2016-10-19 LAB — CULTURE, BLOOD (ROUTINE X 2)
Culture: NO GROWTH
Culture: NO GROWTH

## 2016-10-20 ENCOUNTER — Emergency Department (HOSPITAL_COMMUNITY): Payer: Medicare Other

## 2016-10-20 ENCOUNTER — Observation Stay (HOSPITAL_COMMUNITY)
Admission: EM | Admit: 2016-10-20 | Discharge: 2016-10-21 | Disposition: A | Discharge status: Home / Self Care | Payer: Medicare Other | Attending: Internal Medicine | Admitting: Internal Medicine

## 2016-10-20 ENCOUNTER — Telehealth: Payer: Self-pay

## 2016-10-20 ENCOUNTER — Encounter (HOSPITAL_COMMUNITY): Payer: Self-pay | Admitting: Emergency Medicine

## 2016-10-20 DIAGNOSIS — R531 Weakness: Secondary | ICD-10-CM | POA: Diagnosis not present

## 2016-10-20 DIAGNOSIS — M797 Fibromyalgia: Secondary | ICD-10-CM | POA: Insufficient documentation

## 2016-10-20 DIAGNOSIS — R918 Other nonspecific abnormal finding of lung field: Secondary | ICD-10-CM | POA: Diagnosis not present

## 2016-10-20 DIAGNOSIS — Z940 Kidney transplant status: Secondary | ICD-10-CM | POA: Insufficient documentation

## 2016-10-20 DIAGNOSIS — B3781 Candidal esophagitis: Secondary | ICD-10-CM | POA: Diagnosis not present

## 2016-10-20 DIAGNOSIS — I2510 Atherosclerotic heart disease of native coronary artery without angina pectoris: Secondary | ICD-10-CM | POA: Insufficient documentation

## 2016-10-20 DIAGNOSIS — I6523 Occlusion and stenosis of bilateral carotid arteries: Secondary | ICD-10-CM | POA: Diagnosis not present

## 2016-10-20 DIAGNOSIS — E1151 Type 2 diabetes mellitus with diabetic peripheral angiopathy without gangrene: Secondary | ICD-10-CM | POA: Diagnosis not present

## 2016-10-20 DIAGNOSIS — T8610 Unspecified complication of kidney transplant: Secondary | ICD-10-CM | POA: Diagnosis present

## 2016-10-20 DIAGNOSIS — I120 Hypertensive chronic kidney disease with stage 5 chronic kidney disease or end stage renal disease: Secondary | ICD-10-CM | POA: Insufficient documentation

## 2016-10-20 DIAGNOSIS — E1122 Type 2 diabetes mellitus with diabetic chronic kidney disease: Secondary | ICD-10-CM | POA: Diagnosis not present

## 2016-10-20 DIAGNOSIS — I25810 Atherosclerosis of coronary artery bypass graft(s) without angina pectoris: Secondary | ICD-10-CM | POA: Insufficient documentation

## 2016-10-20 DIAGNOSIS — N186 End stage renal disease: Secondary | ICD-10-CM | POA: Insufficient documentation

## 2016-10-20 DIAGNOSIS — Z79899 Other long term (current) drug therapy: Secondary | ICD-10-CM | POA: Insufficient documentation

## 2016-10-20 DIAGNOSIS — M1990 Unspecified osteoarthritis, unspecified site: Secondary | ICD-10-CM | POA: Diagnosis not present

## 2016-10-20 DIAGNOSIS — G4733 Obstructive sleep apnea (adult) (pediatric): Secondary | ICD-10-CM | POA: Insufficient documentation

## 2016-10-20 DIAGNOSIS — F418 Other specified anxiety disorders: Secondary | ICD-10-CM | POA: Diagnosis not present

## 2016-10-20 DIAGNOSIS — I480 Paroxysmal atrial fibrillation: Secondary | ICD-10-CM | POA: Insufficient documentation

## 2016-10-20 DIAGNOSIS — R112 Nausea with vomiting, unspecified: Principal | ICD-10-CM | POA: Insufficient documentation

## 2016-10-20 DIAGNOSIS — D372 Neoplasm of uncertain behavior of small intestine: Secondary | ICD-10-CM | POA: Insufficient documentation

## 2016-10-20 DIAGNOSIS — Z7951 Long term (current) use of inhaled steroids: Secondary | ICD-10-CM | POA: Insufficient documentation

## 2016-10-20 DIAGNOSIS — J45909 Unspecified asthma, uncomplicated: Secondary | ICD-10-CM | POA: Insufficient documentation

## 2016-10-20 DIAGNOSIS — Z794 Long term (current) use of insulin: Secondary | ICD-10-CM | POA: Insufficient documentation

## 2016-10-20 DIAGNOSIS — R042 Hemoptysis: Secondary | ICD-10-CM

## 2016-10-20 DIAGNOSIS — D539 Nutritional anemia, unspecified: Secondary | ICD-10-CM | POA: Diagnosis present

## 2016-10-20 DIAGNOSIS — R030 Elevated blood-pressure reading, without diagnosis of hypertension: Secondary | ICD-10-CM | POA: Diagnosis not present

## 2016-10-20 DIAGNOSIS — E1142 Type 2 diabetes mellitus with diabetic polyneuropathy: Secondary | ICD-10-CM | POA: Insufficient documentation

## 2016-10-20 DIAGNOSIS — R05 Cough: Secondary | ICD-10-CM | POA: Diagnosis not present

## 2016-10-20 DIAGNOSIS — D132 Benign neoplasm of duodenum: Secondary | ICD-10-CM | POA: Diagnosis not present

## 2016-10-20 DIAGNOSIS — K210 Gastro-esophageal reflux disease with esophagitis: Secondary | ICD-10-CM | POA: Insufficient documentation

## 2016-10-20 DIAGNOSIS — E785 Hyperlipidemia, unspecified: Secondary | ICD-10-CM | POA: Diagnosis not present

## 2016-10-20 DIAGNOSIS — M069 Rheumatoid arthritis, unspecified: Secondary | ICD-10-CM | POA: Insufficient documentation

## 2016-10-20 DIAGNOSIS — I252 Old myocardial infarction: Secondary | ICD-10-CM | POA: Diagnosis not present

## 2016-10-20 DIAGNOSIS — D649 Anemia, unspecified: Secondary | ICD-10-CM | POA: Diagnosis not present

## 2016-10-20 DIAGNOSIS — Z9884 Bariatric surgery status: Secondary | ICD-10-CM | POA: Insufficient documentation

## 2016-10-20 DIAGNOSIS — Z951 Presence of aortocoronary bypass graft: Secondary | ICD-10-CM | POA: Insufficient documentation

## 2016-10-20 DIAGNOSIS — Z7902 Long term (current) use of antithrombotics/antiplatelets: Secondary | ICD-10-CM | POA: Insufficient documentation

## 2016-10-20 DIAGNOSIS — Z955 Presence of coronary angioplasty implant and graft: Secondary | ICD-10-CM | POA: Insufficient documentation

## 2016-10-20 LAB — URINALYSIS, ROUTINE W REFLEX MICROSCOPIC
Bilirubin Urine: NEGATIVE
Glucose, UA: NEGATIVE mg/dL
Hgb urine dipstick: NEGATIVE
Ketones, ur: NEGATIVE mg/dL
Leukocytes, UA: NEGATIVE
Nitrite: NEGATIVE
Protein, ur: NEGATIVE mg/dL
Specific Gravity, Urine: 1.012 (ref 1.005–1.030)
pH: 6 (ref 5.0–8.0)

## 2016-10-20 LAB — TYPE AND SCREEN
ABO/RH(D): O POS
Antibody Screen: NEGATIVE

## 2016-10-20 LAB — COMPREHENSIVE METABOLIC PANEL
ALT: 13 U/L — ABNORMAL LOW (ref 14–54)
AST: 24 U/L (ref 15–41)
Albumin: 3.2 g/dL — ABNORMAL LOW (ref 3.5–5.0)
Alkaline Phosphatase: 120 U/L (ref 38–126)
Anion gap: 13 (ref 5–15)
BUN: 8 mg/dL (ref 6–20)
CO2: 22 mmol/L (ref 22–32)
Calcium: 9.5 mg/dL (ref 8.9–10.3)
Chloride: 107 mmol/L (ref 101–111)
Creatinine, Ser: 0.86 mg/dL (ref 0.44–1.00)
GFR calc Af Amer: >60 mL/min (ref >60)
GFR calc non Af Amer: >60 mL/min (ref >60)
Glucose, Bld: 123 mg/dL — ABNORMAL HIGH (ref 65–99)
Potassium: 3.2 mmol/L — ABNORMAL LOW (ref 3.5–5.1)
Sodium: 142 mmol/L (ref 135–145)
Total Bilirubin: 0.5 mg/dL (ref 0.3–1.2)
Total Protein: 7.1 g/dL (ref 6.5–8.1)

## 2016-10-20 LAB — GLUCOSE, CAPILLARY: Glucose-Capillary: 99 mg/dL (ref 65–99)

## 2016-10-20 LAB — CBC
HCT: 31.1 % — ABNORMAL LOW (ref 36.0–46.0)
Hemoglobin: 9.3 g/dL — ABNORMAL LOW (ref 12.0–15.0)
MCH: 25.7 pg — ABNORMAL LOW (ref 26.0–34.0)
MCHC: 29.9 g/dL — ABNORMAL LOW (ref 30.0–36.0)
MCV: 85.9 fL (ref 78.0–100.0)
Platelets: 293 10*3/uL (ref 150–400)
RBC: 3.62 MIL/uL — ABNORMAL LOW (ref 3.87–5.11)
RDW: 16.8 % — ABNORMAL HIGH (ref 11.5–15.5)
WBC: 4.5 10*3/uL (ref 4.0–10.5)

## 2016-10-20 LAB — I-STAT CG4 LACTIC ACID, ED
Lactic Acid, Venous: 2.21 mmol/L (ref 0.5–1.9)
Lactic Acid, Venous: 1.07 mmol/L (ref 0.5–1.9)

## 2016-10-20 LAB — CBG MONITORING, ED: Glucose-Capillary: 99 mg/dL (ref 65–99)

## 2016-10-20 LAB — LIPASE, BLOOD: LIPASE: 18 U/L (ref 11–51)

## 2016-10-20 MED ORDER — SODIUM CHLORIDE 0.9 % IV BOLUS (SEPSIS)
1000.0000 mL | Freq: Once | INTRAVENOUS | Status: AC
  Administered 2016-10-20: 1000 mL via INTRAVENOUS

## 2016-10-20 MED ORDER — CLOTRIMAZOLE 10 MG MT TROC: 10.0000 mg | Freq: Every day | OROMUCOSAL | 0 refills | Status: DC

## 2016-10-20 MED ORDER — AZATHIOPRINE 50 MG PO TABS
125.0000 mg | ORAL_TABLET | Freq: Every evening | ORAL | Status: DC
  Administered 2016-10-20: 125 mg via ORAL
  Filled 2016-10-20: qty 3

## 2016-10-20 MED ORDER — MONTELUKAST SODIUM 10 MG PO TABS
10.0000 mg | ORAL_TABLET | Freq: Every day | ORAL | Status: DC
  Filled 2016-10-20: qty 1

## 2016-10-20 MED ORDER — ALBUTEROL SULFATE (2.5 MG/3ML) 0.083% IN NEBU: 2.5000 mg | INHALATION_SOLUTION | RESPIRATORY_TRACT | Status: DC | PRN

## 2016-10-20 MED ORDER — ACETAMINOPHEN 325 MG PO TABS
650.0000 mg | ORAL_TABLET | Freq: Four times a day (QID) | ORAL | Status: DC | PRN
  Administered 2016-10-20: 650 mg via ORAL
  Filled 2016-10-20: qty 2

## 2016-10-20 MED ORDER — NYSTATIN 100000 UNIT/ML MT SUSP: 500000.0000 [IU] | Freq: Four times a day (QID) | OROMUCOSAL | 0 refills | Status: DC

## 2016-10-20 MED ORDER — ONDANSETRON 4 MG PO TBDP: 4.0000 mg | ORAL_TABLET | Freq: Three times a day (TID) | ORAL | 0 refills | Status: DC | PRN

## 2016-10-20 MED ORDER — CLOTRIMAZOLE 10 MG MT TROC
10.0000 mg | Freq: Every day | OROMUCOSAL | Status: DC
  Administered 2016-10-20 – 2016-10-21 (×4): 10 mg via ORAL
  Filled 2016-10-20 (×5): qty 1

## 2016-10-20 MED ORDER — CLOPIDOGREL BISULFATE 75 MG PO TABS
75.0000 mg | ORAL_TABLET | Freq: Every day | ORAL | Status: DC
  Administered 2016-10-21: 75 mg via ORAL
  Filled 2016-10-20: qty 1

## 2016-10-20 MED ORDER — ENOXAPARIN SODIUM 40 MG/0.4ML ~~LOC~~ SOLN
40.0000 mg | Freq: Every day | SUBCUTANEOUS | Status: DC
  Administered 2016-10-20: 40 mg via SUBCUTANEOUS
  Filled 2016-10-20: qty 0.4

## 2016-10-20 MED ORDER — ONDANSETRON HCL 4 MG/2ML IJ SOLN
4.0000 mg | Freq: Four times a day (QID) | INTRAMUSCULAR | Status: DC | PRN
Start: 2016-10-20 — End: 2016-10-21

## 2016-10-20 MED ORDER — CALCITRIOL 0.25 MCG PO CAPS
0.2500 ug | ORAL_CAPSULE | Freq: Every morning | ORAL | Status: DC
  Administered 2016-10-21: 0.25 ug via ORAL
  Filled 2016-10-20: qty 1

## 2016-10-20 MED ORDER — METOPROLOL TARTRATE 12.5 MG HALF TABLET
12.5000 mg | ORAL_TABLET | Freq: Two times a day (BID) | ORAL | Status: DC
  Administered 2016-10-20: 12.5 mg via ORAL
  Filled 2016-10-20 (×2): qty 1

## 2016-10-20 MED ORDER — MOMETASONE FURO-FORMOTEROL FUM 200-5 MCG/ACT IN AERO
2.0000 | INHALATION_SPRAY | Freq: Two times a day (BID) | RESPIRATORY_TRACT | Status: DC
  Administered 2016-10-20 – 2016-10-21 (×2): 2 via RESPIRATORY_TRACT
  Filled 2016-10-20: qty 8.8

## 2016-10-20 MED ORDER — FLUTICASONE PROPIONATE 50 MCG/ACT NA SUSP
2.0000 | Freq: Two times a day (BID) | NASAL | Status: DC
  Administered 2016-10-20 – 2016-10-21 (×2): 2 via NASAL
  Filled 2016-10-20: qty 16

## 2016-10-20 MED ORDER — PROMETHAZINE HCL 25 MG PO TABS: 25.0000 mg | ORAL_TABLET | Freq: Every evening | ORAL | Status: DC | PRN

## 2016-10-20 MED ORDER — INSULIN GLARGINE 100 UNIT/ML ~~LOC~~ SOLN
8.0000 [IU] | Freq: Every day | SUBCUTANEOUS | Status: DC | PRN
  Filled 2016-10-20: qty 0.08

## 2016-10-20 MED ORDER — ROSUVASTATIN CALCIUM 20 MG PO TABS: 20.0000 mg | ORAL_TABLET | Freq: Every day | ORAL | Status: DC

## 2016-10-20 MED ORDER — ONDANSETRON HCL 4 MG/2ML IJ SOLN
4.0000 mg | Freq: Once | INTRAMUSCULAR | Status: AC
  Administered 2016-10-20: 4 mg via INTRAVENOUS
  Filled 2016-10-20: qty 2

## 2016-10-20 MED ORDER — ONDANSETRON HCL 4 MG PO TABS: 4.0000 mg | ORAL_TABLET | Freq: Four times a day (QID) | ORAL | Status: DC | PRN

## 2016-10-20 MED ORDER — ONDANSETRON HCL 4 MG/2ML IJ SOLN: 4.0000 mg | Freq: Once | INTRAMUSCULAR | Status: DC

## 2016-10-20 MED ORDER — PROMETHAZINE HCL 25 MG/ML IJ SOLN
12.5000 mg | Freq: Once | INTRAMUSCULAR | Status: AC
  Administered 2016-10-20: 12.5 mg via INTRAVENOUS
  Filled 2016-10-20: qty 1

## 2016-10-20 MED ORDER — SERTRALINE HCL 100 MG PO TABS
100.0000 mg | ORAL_TABLET | Freq: Every morning | ORAL | Status: DC
  Administered 2016-10-21: 100 mg via ORAL
  Filled 2016-10-20: qty 1

## 2016-10-20 MED ORDER — DILTIAZEM HCL ER COATED BEADS 120 MG PO CP24
120.0000 mg | ORAL_CAPSULE | Freq: Every day | ORAL | Status: DC
  Administered 2016-10-21: 120 mg via ORAL
  Filled 2016-10-20: qty 1

## 2016-10-20 MED ORDER — PANTOPRAZOLE SODIUM 40 MG PO TBEC
80.0000 mg | DELAYED_RELEASE_TABLET | Freq: Every day | ORAL | Status: DC
Start: 2016-10-21 — End: 2016-10-21
  Administered 2016-10-21: 80 mg via ORAL
  Filled 2016-10-20: qty 2

## 2016-10-20 MED ORDER — LORAZEPAM 1 MG PO TABS
1.0000 mg | ORAL_TABLET | Freq: Every morning | ORAL | Status: DC
  Administered 2016-10-21: 1 mg via ORAL
  Filled 2016-10-20: qty 1

## 2016-10-20 MED ORDER — ACETAMINOPHEN 650 MG RE SUPP: 650.0000 mg | Freq: Four times a day (QID) | RECTAL | Status: DC | PRN

## 2016-10-20 MED ORDER — DULOXETINE HCL 60 MG PO CPEP
60.0000 mg | ORAL_CAPSULE | Freq: Every morning | ORAL | Status: DC
Start: 2016-10-21 — End: 2016-10-21
  Administered 2016-10-21: 60 mg via ORAL
  Filled 2016-10-20: qty 1

## 2016-10-20 MED ORDER — CYCLOBENZAPRINE HCL 10 MG PO TABS
10.0000 mg | ORAL_TABLET | Freq: Two times a day (BID) | ORAL | Status: DC
  Administered 2016-10-20 – 2016-10-21 (×2): 10 mg via ORAL
  Filled 2016-10-20 (×2): qty 1

## 2016-10-20 MED ORDER — CARBAMAZEPINE 200 MG PO TABS
200.0000 mg | ORAL_TABLET | Freq: Two times a day (BID) | ORAL | Status: DC
  Administered 2016-10-20 – 2016-10-21 (×2): 200 mg via ORAL
  Filled 2016-10-20 (×2): qty 1

## 2016-10-20 MED ORDER — LACTATED RINGERS IV SOLN
INTRAVENOUS | Status: DC
  Administered 2016-10-20: 23:00:00 via INTRAVENOUS

## 2016-10-20 MED ORDER — INSULIN ASPART 100 UNIT/ML ~~LOC~~ SOLN
0.0000 [IU] | Freq: Three times a day (TID) | SUBCUTANEOUS | Status: DC
  Administered 2016-10-21: 2 [IU] via SUBCUTANEOUS
  Administered 2016-10-21: 3 [IU] via SUBCUTANEOUS

## 2016-10-20 MED ORDER — PREDNISONE 5 MG PO TABS
5.0000 mg | ORAL_TABLET | Freq: Every morning | ORAL | Status: DC
  Administered 2016-10-21: 5 mg via ORAL
  Filled 2016-10-20: qty 1

## 2016-10-20 MED ORDER — OXYCODONE HCL 5 MG PO TABS
5.0000 mg | ORAL_TABLET | Freq: Three times a day (TID) | ORAL | Status: DC | PRN
  Administered 2016-10-21: 5 mg via ORAL
  Filled 2016-10-20: qty 1

## 2016-10-20 MED ORDER — ACETAMINOPHEN 325 MG PO TABS
650.0000 mg | ORAL_TABLET | Freq: Once | ORAL | Status: AC
  Administered 2016-10-20: 650 mg via ORAL
  Filled 2016-10-20: qty 2

## 2016-10-20 NOTE — ED Notes (Signed)
cbg was 99

## 2016-10-20 NOTE — H&P (Signed)
History and Physical    Katherine [name] TFT:[id number] DOB: 1956/01/06 DOA: 10/20/2016  PCP: Katherine Sou, MD Consultants:  Katherine [name] - pulmonology; Katherine [name] - surgery; Katherine [name] - GI; Katherine [name] - pain management Patient coming from: home - lives with husband; NOK: husband, [phone]  Chief Complaint: intractable n/v  HPI: Katherine [name] is a 61 y.o. female with medical history significant of renal transplant on immunosuppressants, DM, CAD s/p CABG and numerous stents, lap band, and asthma who was hospitalized from 2/28-3/3 for symptomatic anemia resulting from a GI bleed.  She presents today with abdominal pain, n/v.  During her prior hospitalization, she was transfused 1 unit PRBC and had an EGD which showed candidal esophagitis and a large duodenal adenoma.  She was supposed to have colonoscopy during that stay but she was unable to tolerate PO contrast so the plan was for her to have that as an outpatient.    She reports that after discharge, her pharmacy called to warn her that Tegretol and Diflucan have a drug interaction that can enhance the side effects of Tegretol.  She takes Tegretol for fibromyalgia, RA, OA.  She is concerned about stopping the Tegretol.  Since she was discharged, she developed refractory n/v, inability to take PO.  Unable to keep fluids or any food down.  This AM, she was coughing up blood from nose and mouth.  Dr. Cristina [name] recommended that she come to the ER.    Currently feeling weak, tired.  Has been able to drink 2 small ginger ales and currently is asking for a regular Coke.   ED Course: Failed PO challenge Consult by GI, Dr. Oletta [name] - suggests Diflucan or Mycelex troches if "this is not practical." Consult by surgery, Dr. Excell [name] - lap band loosened, although this was not thought to be significantly contributing to her symptoms  Review of Systems: As per HPI; otherwise 10 point review of systems reviewed and negative.   Ambulatory Status:  Ambulates  without assistance  Past Medical History:  Diagnosis Date  . Anxiety   . Atrial fibrillation ([place])   . Bilateral carotid artery stenosis    Carotid duplex 08/5174: 1-60% LICA, 73-71% RICA, >06% RECA, f/u 1 yr suggested  . CAD (coronary artery disease) of bypass graft 5/01; 3/'02, 8/'03, 10/'04; 1/15   PCI x 5 to SVG-D1   . CAD in native artery 07/1993    3 Vessel Disease (LAD-D1 & RCA) -- CABG  . CAD S/P percutaneous coronary angioplasty    PCI to SVG-D1 insertion/native D1 x 4 = '01 -(S660 BMS 2.5 x 9 - insertion into D1; '02 - distal overlap ACS Pixel 2.5 x 8  BMS; '03 distal/native ISR/Thrombosis - Pixel 2.5 x 13; '04 - ISR-  Taxus 2.5 x 20 (covered all);; 1/15 - mid SVG-D1 (50% distal ISR) - Promus P 2.75 x 20 -- 2.8 mm  . COPD mixed type ([place])    Followed by Dr. Lamonte [name] "pulmonologist said no COPD"  . Depression with anxiety   . Diabetes mellitus type 2 in obese ([place])   . Dyslipidemia, goal LDL below 70    08/2012: TC 137, TG 200, HDL 32!, LDL 45; on statin (followed by Dr.Deterding)  . Enlarged heart    "told one time"  . ESRD (end stage renal disease) ([place]) 1991   s/p Cadaveric Renal Transplant [facility] - Dr. Jimmy [name])   . Family history of adverse reaction to anesthesia    mom's bp dropped during/after anesthesia  . Fibromyalgia   .  GERD (gastroesophageal reflux disease)   . Glomerulonephritis, chronic, rapidly progressive 82  . 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 07/1993   Rescue PTCA of RCA -- referred for CABG.  . H/O: GI bleed   . Hypertension associated with diabetes ([place])   . MRSA (methicillin resistant staph aureus) culture positive   . Obesity    s/p Lap Band 04/2004- Port Replacement 10/09 & 2/10 for infection; Dr. Excell [name].  . OSA (obstructive sleep apnea)    no longer on CPAP or home O2  . PAD (peripheral artery disease) ([place]) 08/2013   LEA Dopplers to be read by Dr. Fletcher Anon  . PAF (paroxysmal atrial fibrillation) ([place])    Noted  on CardioNet Monitor 11/5-18 - rate ~112  . Recurrent boils    Bilateral Groin  . Rheumatoid arthritis (Katherine [name])    Per Patient Report; associated with OA  . 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 Servando [name]: LIMA-LAD, SVG-bifurcatingD1, SVG-rPDA  . S/p cadaver renal transplant [place]  . Stroke [facility]) 2012   "right eye stroke- half blind now"  . Unstable angina ([place]) 5/01; 3/'02, 8/'03, 10/'04; 1/15   x 5 occurences since Inf-Post STEMI in 1994    Past Surgical History:  Procedure Laterality Date  . CARDIAC CATHETERIZATION  5/'01, 3/'02, 8/'03, 10/'04; 1/'15   08/22/2013: LAD & RCA 100%; LIMA-LAD & SVG-rPDA patent; Cx-- OM1 60%, OM2 ostial ~50%; SVG-D1 - 80% mid, 50% distal ISR --PCI  . CARDIAC CATHETERIZATION  5/'01, 3/'02, 8/'03, 10/'04; 1/'15   08/22/2013: LAD & RCA 100%; LIMA-LAD & SVG-rPDA patent; Cx-- OM1 60%, OM2 ostial ~50%; SVG-D1 - 80% mid, 50% distal ISR --PCI  . CATHETER REMOVAL    . CHOLECYSTECTOMY N/A 10/29/2014   Procedure: LAPAROSCOPIC CHOLECYSTECTOMY WITH INTRAOPERATIVE CHOLANGIOGRAM;  Surgeon: Katherine Seltzer, MD;  Location: [facility] ORS;  Service: General;  Laterality: N/A;  . CORONARY ANGIOPLASTY  1994   x5  . CORONARY ARTERY BYPASS GRAFT  1995   LIMA-LAD, SVG-RPDA, SVG-D1  . ESOPHAGOGASTRODUODENOSCOPY N/A 10/15/2016   Procedure: ESOPHAGOGASTRODUODENOSCOPY (EGD);  Surgeon: Katherine Corner, MD;  Location: [facility] ENDOSCOPY;  Service: Endoscopy;  Laterality: N/A;  . INCISE AND DRAIN ABCESS    . KIDNEY TRANSPLANT  1991  . LAPAROSCOPIC GASTRIC BANDING  04/2004; 10/'09, 2/'10   Port Replacement x 2  . LEFT HEART CATHETERIZATION WITH CORONARY/GRAFT ANGIOGRAM N/A 08/23/2013   Procedure: LEFT HEART CATHETERIZATION WITH Beatrix Fetters;  Surgeon: Katherine Hampshire, MD;  Location: [facility] CATH LAB;  Service: Cardiovascular;  Laterality: N/A;  . Lower Extremity Arterial Dopplers  08/2013   ABI: R 0.96, L 1.04  . MULTIPLE TOOTH EXTRACTIONS  age 21  . PERCUTANEOUS CORONARY STENT INTERVENTION (PCI-S)   5/'01, 3/'02, 8/'03, 10/'04;   '01 - S660 BMS 2.5 x 9 - dSVG-D1 into D1; '02- post-stent stenosis - 2.5 x 8 Pixel BMS; '8\03: ISR/Thrombosis into native D1 - AngioJet, 2.5 x 13 Pixel; '04 - ISR 95% - covered stented area with Taxus DES 2.5 mm x 20 (2.88)  . PERCUTANEOUS CORONARY STENT INTERVENTION (PCI-S)  08/22/2013   mid SVG-D1 80%; distal stent ~50% ISR; Promus Prermier DES 2.75 mm xc 20 mm (2.8 mm)  . PERCUTANEOUS CORONARY STENT INTERVENTION (PCI-S)  08/23/2013   Procedure: PERCUTANEOUS CORONARY STENT INTERVENTION (PCI-S);  Surgeon: Katherine Hampshire, MD;  Location: [facility] CATH LAB;  Service: Cardiovascular;;  . PORT-A-CATH REMOVAL     kidney  . TRANSTHORACIC ECHOCARDIOGRAM  02/2013   NL LV Size,  EF 55-60%; MAC with Mild MR.  . TUBAL LIGATION    . wrist fistula repair Left    dialysis for one year    Social History   Social History  . Marital status: Married    Spouse name: N/A  . Number of children: N/A  . Years of education: N/A   Occupational History  . Not on file.   Social History Main Topics  . Smoking status: Former Smoker    Packs/day: 1.00    Years: 30.00    Types: Cigarettes    Quit date: 08/17/2002  . Smokeless tobacco: Never Used  . Alcohol use No  . Drug use: No  . Sexual activity: Not on file   Other Topics Concern  . 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 Prior to his diagnosis of her husband, she actually had adopted a 38-year-old child who she knows caring for as well. With all the surgeries, they have been quite financially troubled. Thanks the help of her community and church, they have been able to stay "alfoat."     She is a former smoker who quit in 2004 after a 30-pack-year history.   She is active chasing a 46-year-old child, does not do routine exercise. She's been quite depressed with the condition of her husband, and  admits to eating comfort herself.   She does not drink alcohol.    Allergies  Allergen Reactions  . Codeine Nausea And Vomiting  . Erythromycin Nausea And Vomiting  . Hydromorphone Hcl Nausea And Vomiting  . Morphine And Related Nausea And Vomiting  . Nalbuphine Nausea And Vomiting  . Niaspan [Niacin Er] Other (See Comments)    Mouth blisters  . Sulfa Antibiotics Nausea Only and Other (See Comments)    "Tears up stomach"  . Sulfonamide Derivatives Other (See Comments)    Reaction: per patient "tears her stomach up"  . Tetracycline Hives  . Tape Rash    No "plastic" tape," please    Family History  Problem Relation Age of Onset  . Cancer Mother     liver  . Heart disease Father   . Cancer Father     colon  . Arrhythmia Brother     Atrial Fibrillation  . Arrhythmia Paternal Aunt     Atrial Fibrillation    Prior to Admission medications   Medication Sig Start Date End Date Taking? Authorizing Provider  acetaminophen (TYLENOL) 500 MG tablet Take 1 tablet (500 mg total) by mouth every 6 (six) hours as needed for moderate pain or headache. 10/17/16  Yes Mauricio Gerome Apley, MD  azaTHIOprine (IMURAN) 50 MG tablet Take 125 mg by mouth every evening.    Yes Historical Provider, MD  budesonide-formoterol (SYMBICORT) 160-4.5 MCG/ACT inhaler Inhale 2 puffs into the lungs 2 (two) times daily. 06/16/16 07/17/17 Yes Collene Gobble, MD  calcitRIOL (ROCALTROL) 0.25 MCG capsule Take 0.25 mcg by mouth every morning.    Yes Historical Provider, MD  carbamazepine (TEGRETOL) 200 MG tablet Take 1 tablet (200 mg total) by mouth 2 (two) times daily. 10/17/16  Yes Mauricio Gerome Apley, MD  clopidogrel (PLAVIX) 75 MG tablet TAKE 1 TABLET (75 MG TOTAL) BY MOUTH DAILY. 02/11/16  Yes [name], MD  cyclobenzaprine (FLEXERIL) 10 MG tablet Take 10 mg by mouth 2 (two) times daily.    Yes Historical  Provider, MD  diltiazem (CARDIZEM CD) 120 MG 24 hr capsule Take 1 capsule (120 mg total) by mouth  daily. NEED OV. Patient taking differently: Take 120 mg by mouth daily.  08/13/16  Yes [name], MD  DULoxetine (CYMBALTA) 30 MG capsule TAKE 2 CAPSULES (60 MG TOTAL) BY MOUTH EVERY MORNING. 09/29/16  Yes Meredith Staggers, MD  ferrous sulfate 325 (65 FE) MG tablet Take 1 tablet (325 mg total) by mouth 3 (three) times daily with meals. 10/17/16  Yes Mauricio Gerome Apley, MD  fluticasone (FLONASE) 50 MCG/ACT nasal spray PLACE 2 SPRAYS INTO THE NOSE DAILY Patient taking differently: Place 2 sprays into each nostril two times a day 08/28/16  Yes Collene Gobble, MD  glimepiride (AMARYL) 4 MG tablet Take 4 mg by mouth every morning. 04/20/16  Yes Historical Provider, MD  insulin glargine (LANTUS) 100 UNIT/ML injection Inject 8 Units into the skin See admin instructions. In the morning as needed for BGL of 160 or greater   Yes Historical Provider, MD  LORazepam (ATIVAN) 1 MG tablet Take 1 mg by mouth every morning.    Yes Historical Provider, MD  metFORMIN (GLUCOPHAGE) 500 MG tablet Take 500 mg by mouth 2 (two) times daily. 10/03/16  Yes Historical Provider, MD  metoprolol tartrate (LOPRESSOR) 25 MG tablet Take half tablet (12.5 mg total) by mouth daily. Take whole tablet (25 mg total) by mouth daily if SBP > 150. Patient taking differently: Take 12.5-25 mg by mouth See admin instructions. Take half tablet (12.5 mg total) by mouth daily. Take whole tablet (25 mg total) by mouth daily if SBP > 150. 02/20/16  Yes Eileen Stanford, PA-C  montelukast (SINGULAIR) 10 MG tablet TAKE 1 TABLET (10 MG TOTAL) BY MOUTH DAILY. 07/06/16  Yes Collene Gobble, MD  nitroGLYCERIN (NITROSTAT) 0.4 MG SL tablet Place 1 tablet (0.4 mg total) under the tongue every 5 (five) minutes as needed for chest pain. 09/11/13  Yes [name], MD  omeprazole (PRILOSEC) 40 MG capsule Take 40 mg by mouth every morning.    Yes Historical Provider, MD  oxyCODONE (OXY IR/ROXICODONE) 5 MG immediate release tablet Take 1 tablet (5 mg total) by  mouth every 8 (eight) hours as needed for severe pain. 05/19/16  Yes Meredith Staggers, MD  predniSONE (DELTASONE) 5 MG tablet Take 5 mg by mouth every morning.    Yes Historical Provider, MD  promethazine (PHENERGAN) 25 MG tablet Take 25 mg by mouth at bedtime as needed for nausea or vomiting. For nausea and sleep   Yes Historical Provider, MD  rosuvastatin (CRESTOR) 20 MG tablet TAKE 1 TABLET (20 MG TOTAL) BY MOUTH DAILY. 06/29/16  Yes [name], MD  sertraline (ZOLOFT) 100 MG tablet Take 100 mg by mouth every morning.   Yes Historical Provider, MD  VENTOLIN HFA 108 (90 Base) MCG/ACT inhaler INHALE 2 PUFFS INTO THE LUNGS EVERY 6 (SIX) HOURS AS NEEDED FOR WHEEZING OR SHORTNESS OF BREATH. 10/13/16  Yes Collene Gobble, MD  clotrimazole (MYCELEX) 10 MG troche Take 1 tablet (10 mg total) by mouth 5 (five) times daily. 10/20/16   Recardo Evangelist, PA-C  fluconazole (DIFLUCAN) 100 MG tablet Take 2 tablets (200 mg total) by mouth daily. Patient not taking: Reported on 10/20/2016 10/17/16 10/31/16  Tawni Millers, MD  ondansetron (ZOFRAN ODT) 4 MG disintegrating tablet Take 1 tablet (4 mg total) by mouth every 8 (eight) hours as needed for nausea or vomiting. 10/20/16  Recardo Evangelist, PA-C  ondansetron (ZOFRAN ODT) 4 MG disintegrating tablet Take 1 tablet (4 mg total) by mouth every 8 (eight) hours as needed for nausea or vomiting. 10/20/16   Recardo Evangelist, PA-C    Physical Exam: Vitals:   10/20/16 2000 10/20/16 2100 10/20/16 2210 10/20/16 2216  BP: 113/70 130/61 115/61   Pulse: 72 74 60   Resp: _0 Temp:   98.5 F (36.9 C)   TempSrc:   Oral   SpO2: 98% 95% 99%   Weight:   (P) 82.7 kg (182 lb 4.8 oz) (P) 81.2 kg (179 lb 1.6 oz)     General:  Appears calm and comfortable and is NAD Eyes:  PERRL, EOMI, normal lids, iris ENT:  grossly normal hearing, lips & tongue, mmm Neck:  no LAD, masses or thyromegaly Cardiovascular:  RRR, no m/r/g. No LE edema.  Respiratory:  CTA  bilaterally, no w/r/r. Normal respiratory effort. Abdomen:  soft, +midepigastric and periumbilical TTP, nd, NABS Skin:  no rash or induration seen on limited exam Musculoskeletal:  grossly normal tone BUE/BLE, good ROM, no bony abnormality Psychiatric:  grossly normal mood and affect, speech fluent and appropriate, AOx3 Neurologic:  CN 2-12 grossly intact, moves all extremities in coordinated fashion, sensation intact  Labs on Admission: I have personally reviewed following labs and imaging studies  CBC:  Recent Labs Lab 10/14/16 0926 10/15/16 0227 10/16/16 0231 10/17/16 0222 10/20/16 1156  WBC 6.6 6.0 4.8 3.7* 4.5  NEUTROABS  --   --  3.1  --   --   HGB 7.5* 8.8* 8.7* 8.3* 9.3*  HCT 25.0* 28.5* 28.0* 26.5* 31.1*  MCV 86.2 85.1 85.6 84.1 85.9  PLT 319 316 296 287 751   Basic Metabolic Panel:  Recent Labs Lab 10/14/16 0141 10/16/16 0231 10/17/16 0222 10/20/16 1156  NA 138 141 140 142  K 3.6 3.0* 2.8* 3.2*  CL 101 104 103 107  CO2 _1 GLUCOSE 105* 138* 155* 123*  BUN 10 6 <5* 8  CREATININE 0.80 0.79 0.75 0.86  CALCIUM 9.6 9.2 9.2 9.5  MG  --   --  1.5*  --    GFR: Estimated Creatinine Clearance: 69.5 mL/min (by C-G formula based on SCr of 0.86 mg/dL). Liver Function Tests:  Recent Labs Lab 10/20/16 1156  AST 24  ALT 13*  ALKPHOS 120  BILITOT 0.5  PROT 7.1  ALBUMIN 3.2*    Recent Labs Lab 10/20/16 1156  LIPASE 18   No results for input(s): AMMONIA in the last 168 hours. Coagulation Profile:  Recent Labs Lab 10/14/16 1230  INR 1.08   Cardiac Enzymes:  Recent Labs Lab 10/17/16 0222  TROPONINI 0.03*   BNP (last 3 results)  Recent Labs  02/07/16 1224  PROBNP 139.0*   HbA1C: No results for input(s): HGBA1C in the last 72 hours. CBG:  Recent Labs Lab 10/17/16 0433 10/17/16 0807 10/17/16 1231 10/20/16 1242 10/20/16 2214  GLUCAP 153* 185* 153* 99 99   Lipid Profile: No results for input(s): CHOL, HDL, LDLCALC, TRIG,  CHOLHDL, LDLDIRECT in the last 72 hours. Thyroid Function Tests: No results for input(s): TSH, T4TOTAL, FREET4, T3FREE, THYROIDAB in the last 72 hours. Anemia Panel: No results for input(s): VITAMINB12, FOLATE, FERRITIN, TIBC, IRON, RETICCTPCT in the last 72 hours. Urine analysis:    Component Value Date/Time   COLORURINE YELLOW 10/20/2016 1543   APPEARANCEUR CLEAR 10/20/2016 1543   LABSPEC 1.012 10/20/2016 1543  PHURINE 6.0 10/20/2016 1543   GLUCOSEU NEGATIVE 10/20/2016 1543   HGBUR NEGATIVE 10/20/2016 1543   BILIRUBINUR NEGATIVE 10/20/2016 1543   KETONESUR NEGATIVE 10/20/2016 1543   PROTEINUR NEGATIVE 10/20/2016 1543   UROBILINOGEN 0.2 10/16/2013 0110   NITRITE NEGATIVE 10/20/2016 1543   LEUKOCYTESUR NEGATIVE 10/20/2016 1543    Creatinine Clearance: Estimated Creatinine Clearance: 69.5 mL/min (by C-G formula based on SCr of 0.86 mg/dL).  Sepsis Labs: _0 (procalcitonin:4,lacticidven:4) ) Recent Results (from the past 240 hour(s))  MRSA PCR Screening     Status: None   Collection Time: 10/14/16  3:25 AM  Result Value Ref Range Status   MRSA by PCR NEGATIVE NEGATIVE Final    Comment:        The GeneXpert MRSA Assay (FDA approved for NASAL specimens only), is one component of a comprehensive MRSA colonization surveillance program. It is not intended to diagnose MRSA infection nor to guide or monitor treatment for MRSA infections.   Culture, blood (routine x 2) Call MD if unable to obtain prior to antibiotics being given     Status: None   Collection Time: 10/14/16  4:02 AM  Result Value Ref Range Status   Specimen Description BLOOD RIGHT HAND  Final   Special Requests   Final    BOTTLES DRAWN AEROBIC AND ANAEROBIC 4CC RED 5CC BLUE   Culture NO GROWTH 5 DAYS  Final   Report Status 10/19/2016 FINAL  Final  Culture, blood (routine x 2) Call MD if unable to obtain prior to antibiotics being given     Status: None   Collection Time: 10/14/16  4:13 AM  Result  Value Ref Range Status   Specimen Description BLOOD LEFT ARM  Final   Special Requests   Final    BOTTLES DRAWN AEROBIC AND ANAEROBIC 3CC RED 4CC BLUE   Culture NO GROWTH 5 DAYS  Final   Report Status 10/19/2016 FINAL  Final  Culture, sputum-assessment     Status: None   Collection Time: 10/14/16  2:03 PM  Result Value Ref Range Status   Specimen Description SPUTUM  Final   Special Requests NONE  Final   Sputum evaluation THIS SPECIMEN IS ACCEPTABLE FOR SPUTUM CULTURE  Final   Report Status 10/14/2016 FINAL  Final  Culture, respiratory (NON-Expectorated)     Status: None   Collection Time: 10/14/16  2:03 PM  Result Value Ref Range Status   Specimen Description SPUTUM  Final   Special Requests NONE Reflexed from [id number]  Final   Gram Stain   Final    ABUNDANT WBC PRESENT,BOTH PMN AND MONONUCLEAR ABUNDANT GRAM POSITIVE RODS FEW GRAM NEGATIVE RODS RARE GRAM POSITIVE COCCI IN PAIRS RARE YEAST    Culture Consistent with normal respiratory flora.  Final   Report Status 10/17/2016 FINAL  Final     Radiological Exams on Admission: Dg Chest 2 View  Result Date: 10/20/2016 CLINICAL DATA:  Cough, pneumonia. EXAM: CHEST  2 VIEW COMPARISON:  Radiographs of October 13, 2016. CT scan of October 14, 2016. FINDINGS: The heart size and mediastinal contours are within normal limits. Status post coronary artery bypass graft. No pneumothorax or pleural effusion is noted. Stable nodular densities are noted in the right upper lobe as described on prior exam. Left lung appears essentially clear at this time. The visualized skeletal structures are unremarkable. IMPRESSION: Stable right upper lobe nodular densities as noted on prior exam which may be inflammatory or infectious in etiology. Continued CT or radiographic follow-up is recommended to ensure  resolution. Electronically Signed   By: Marijo Conception, M.D.   On: 10/20/2016 11:55    EKG: Independently reviewed.  Sinus arrhythmia with rate 75;  nonspecific ST changes with no evidence of acute ischemia  Assessment/Plan Principal Problem:   Esophageal candidiasis (HCC) Active Problems:   Renal transplant disorder   Lung nodules   Anemia   Duodenal adenoma   Esophageal candidiasis -Patient with EGD-proven esophageal candidiasis during prior hospitalization last week -She went home 3/3 and reports that she was told by her pharmacy that she should not take Diflucan with Tegretol due to increased risk for side effects -As a result, she has not been taking Diflucan and so developed recurrent n/v -Hospitalist service was called for admission for refractory n/v, which had essentially resolved by the time I evaluated the patient; she was able to drink 12 ounces of ginger ale and was asking for additional sodas -Based on her recent GI issues requiring hospitalization, it is reasonable to observe her again overnight -Will continue clear liquids and advance diet as tolerated -For the candidiasis, Diflucan would be her best option. It would likely be fine to take it for a short duration with the Tegretol, but the patient is reluctant.  She takes Tegretol for an off-label use, and so holding the Tegretol would also be a consideration - but the patient is uncomfortable with this.  For now, will use Mycelex troches and will defer to the day team/GI regarding ongoing treatment of this issue. -If she is able to advance her diet, she is likely appropriate for discharge tomorrow. -K 3.2 on presentation, will replete -Albumin 3.2, improved -Lactate 2.21, 1.07 - likely from mild dehydration, although without renal insufficiency  S/p renal transplant, on immunosuppressants -She is at increased risk of opportunistic infections like the candidal esophagitis -No other concerns for infection at this time -Continue Imuran  Anemia -Hgb 9.3, prior 8.3 -She continues to feel very weak/tired.  This is likely multifactorial and relates to recent  hospitalization, n/v, as well as anemia. -This should continue to slowly improve with time. -She declines PT evaluation.  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 The aforementioned taken together are felt to place the patient at high risk for further clinical deterioration. However it is anticipated that the patient may be medically stable for discharge from the hospital within 24 to 48 hours.    Karmen Bongo MD [facility]  If 7PM-7AM, please contact night-coverage [email or web address] Password [email or web address]  10/21/2016, 12:36 AM

## 2016-10-20 NOTE — Telephone Encounter (Signed)
If she's only using diflucan for a week or less there shouldn't be any problems.

## 2016-10-20 NOTE — ED Triage Notes (Signed)
Pt from home via [facility] with c/o generalized weakness, N/V, and hemoptysis with known pneumonia s/p hospitialization 10/17/16.  Pt states she was anemic and given RBC with unknown bleeding location.  6/10 pain in abdomen.  NAD, A&O.

## 2016-10-20 NOTE — ED Notes (Signed)
Coke given per pt request, pt vomited same, then requested ginger ale; pt requesting phenergan and tylenol.

## 2016-10-20 NOTE — Telephone Encounter (Signed)
Patient called stating was given a prescription of diflucan, stated was worried about taking this medication as it may increase side effects of her current medication regime, attempted to call patient back for which medicine she is worried about, left voice mail to return call.

## 2016-10-20 NOTE — ED Provider Notes (Signed)
[facility] DEPT Provider Note   CSN: [id number] Arrival date & time: 10/20/16  1108     History   Chief Complaint Chief Complaint  Patient presents with  . Weakness    HPI Katherine [name] is a 61 y.o. female with complicated medical history as listed below who presents with weakness and multiple other complaints. 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 She was also noted to have esophageal candidiasis as well but has not been taking Diflucan because she is concerned it interacts with her Tegretol. She did receive a unit of blood during her hospital stay and felt somewhat better after this however tells me that overall she still feels very weak and tired.   She also complains of constant cramping periumbilical abdominal pain which has been ongoing for over a week and endorses this was present during her hospital stay. Over the past couple days she has had nausea with several episodes of vomiting and intermittent hematemesis (streaks). A colonoscopy was recommended while in the hospital however patient was unable to finish her bowel prep. Her hemoglobin remained stable and therefore they recommended outpatient colonoscopy.   She was also diagnosed with pulmonary nodules of uncertain etiology last week which was thought to be infectious in nature at her last visit. She is put on a course of Levaquin which she has completed. She continues to have a mildly productive cough and today endorses coughing up a "clot" but also states her nose was bleeding.   She is also on immunosuppressants for renal transplant and endorses elevated temp and chills.   HPI  Past Medical History:  Diagnosis Date  . Anxiety   . Bilateral carotid artery stenosis    Carotid duplex 0/8657: 8-46% LICA, 96-29% RICA, >52% RECA, f/u 1 yr suggested  . CAD (coronary artery disease) of bypass graft 5/01; 3/'02, 8/'03, 10/'04; 1/15   PCI x 5 to SVG-D1     . CAD in native artery 07/1993    3 Vessel Disease (LAD-D1 & RCA) -- CABG  . CAD S/P percutaneous coronary angioplasty    PCI to SVG-D1 insertion/native D1 x 4 = '01 -(S660 BMS 2.5 x 9 - insertion into D1; '02 - distal overlap ACS Pixel 2.5 x 8  BMS; '03 distal/native ISR/Thrombosis - Pixel 2.5 x 13; '04 - ISR-  Taxus 2.5 x 20 (covered all);; 1/15 - mid SVG-D1 (50% distal ISR) - Promus P 2.75 x 20 -- 2.8 mm  . COPD mixed type ([place])    Followed by Dr. Lamonte Sakai "pulmonologist said no COPD"  . Depression   . Depression with anxiety   . Diabetes mellitus type 2 in obese ([place])   . Dyslipidemia, goal LDL below 70    08/2012: TC 137, TG 200, HDL 32!, LDL 45; on statin (followed by Dr.Deterding)  . Enlarged heart    "told one time"  . ESRD (end stage renal disease) ([place]) 1991   s/p Cadaveric Renal Transplant [facility] - Dr. Jimmy Footman)   . Family history of adverse reaction to anesthesia    mom's bp dropped during/after anesthesia  . Fibromyalgia   . GERD (gastroesophageal reflux disease)   . Glomerulonephritis, chronic, rapidly progressive 63  . 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 07/1993   Rescue PTCA of RCA -- referred for CABG.  . H/O: GI bleed   . Hypertension associated with diabetes ([place])   . MRSA (methicillin  resistant staph aureus) culture positive   . Obesity    s/p Lap Band 04/2004- Port Replacement 10/09 & 2/10 for infection; Dr. Excell Seltzer.  . OSA on CPAP    oxygen @night  @ 2L  . PAD (peripheral artery disease) ([place]) 08/2013   LEA Dopplers to be read by Dr. Fletcher Anon  . PAF (paroxysmal atrial fibrillation) ([place])    Noted on CardioNet Monitor 11/5-18 - rate ~112  . Recurrent boils    Bilateral Groin  . Rheumatoid arthritis ([place])    Per Patient Report; associated with OA  . 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 Servando Snare: LIMA-LAD, SVG-bifurcatingD1, SVG-rPDA  . S/p cadaver renal transplant [place]  . Stroke [facility]) 2012   "right eye stroke- half blind now"  .  Unstable angina ([place]) 5/01; 3/'02, 8/'03, 10/'04; 1/15   x 5 occurences since Inf-Post STEMI in 1994    Patient Active Problem List   Diagnosis Date Noted  . Acute GI bleeding 10/14/2016  . Lung nodules 10/14/2016  . Anemia 10/14/2016  . Symptomatic anemia 10/13/2016  . Right rotator cuff tendinitis 05/19/2016  . Hip pain, bilateral 03/30/2016  . Bilateral knee pain 03/30/2016  . Fibromyalgia 03/30/2016  . Spondylosis of lumbar region without myelopathy or radiculopathy 03/30/2016  . ESRD (end stage renal disease) ([place]) 03/30/2016  . Type 2 diabetes mellitus with peripheral neuropathy ([place]) 03/30/2016  . Bright red rectal bleeding 08/03/2014  . PAF (paroxysmal atrial fibrillation) ([place]) 06/28/2014  . Weakness 06/28/2014  . Palpitations 06/21/2014  . Frequent PVCs 06/21/2014  . Fatigue 06/21/2014  . CAD in native artery 09/11/2013  . CAD S/P percutaneous coronary angioplasty - PCI x 5 to SVG-D1 09/11/2013    Class: Diagnosis of  . Hypertension associated with diabetes ([place])   . Chest pain 08/23/2013  . PAD (peripheral artery disease) ([place]) 08/17/2013  . Atherosclerosis of coronary artery bypass graft without angina pectoris 10/08/2012  . Stenosis of right carotid artery without infarction 10/08/2012  . Dyslipidemia, goal LDL below 70 10/08/2012    Class: Diagnosis of  . Mitral annular calcification 10/08/2012  . Leukocytosis 06/13/2012  . Hypomagnesemia 06/13/2012  . Hypokalemia 06/12/2012  . Fever 06/12/2012  . Cellulitis 06/12/2012  . Renal transplant disorder 06/12/2012  . Chronic allergic rhinitis 04/29/2011  . OTITIS EXTERNA, ACUTE 09/12/2007  . Sleep apnea 09/09/2007  . Extrinsic asthma 09/09/2007  . SINUSITIS, CHRONIC 09/09/2007  . GERD 09/09/2007  . COUGH, CHRONIC 09/09/2007  . Morbid obesity - s/p Lap Band 9/'05 05/07/2004    Class: Diagnosis of  . S/P CABG (coronary artery bypass graft) x 3 09/07/1993    Class: History of  . ST elevation myocardial  infarction (STEMI) of inferoposterior wall; H/o -- Rescue PTCA of RCA -- referred for CABG 08/07/1993    Class: History of    Past Surgical History:  Procedure Laterality Date  . CARDIAC CATHETERIZATION  5/'01, 3/'02, 8/'03, 10/'04; 1/'15   08/22/2013: LAD & RCA 100%; LIMA-LAD & SVG-rPDA patent; Cx-- OM1 60%, OM2 ostial ~50%; SVG-D1 - 80% mid, 50% distal ISR --PCI  . CARDIAC CATHETERIZATION  5/'01, 3/'02, 8/'03, 10/'04; 1/'15   08/22/2013: LAD & RCA 100%; LIMA-LAD & SVG-rPDA patent; Cx-- OM1 60%, OM2 ostial ~50%; SVG-D1 - 80% mid, 50% distal ISR --PCI  . CATHETER REMOVAL    . CHOLECYSTECTOMY N/A 10/29/2014   Procedure: LAPAROSCOPIC CHOLECYSTECTOMY WITH INTRAOPERATIVE CHOLANGIOGRAM;  Surgeon: Excell Seltzer, MD;  Location: [facility] ORS;  Service: General;  Laterality: N/A;  .  CORONARY ANGIOPLASTY  1994   x5  . CORONARY ARTERY BYPASS GRAFT  1995   LIMA-LAD, SVG-RPDA, SVG-D1  . ESOPHAGOGASTRODUODENOSCOPY N/A 10/15/2016   Procedure: ESOPHAGOGASTRODUODENOSCOPY (EGD);  Surgeon: Wilford Corner, MD;  Location: [facility] ENDOSCOPY;  Service: Endoscopy;  Laterality: N/A;  . INCISE AND DRAIN ABCESS    . KIDNEY TRANSPLANT  1991  . LAPAROSCOPIC GASTRIC BANDING  04/2004; 10/'09, 2/'10   Port Replacement x 2  . LEFT HEART CATHETERIZATION WITH CORONARY/GRAFT ANGIOGRAM N/A 08/23/2013   Procedure: LEFT HEART CATHETERIZATION WITH Beatrix Fetters;  Surgeon: Wellington Hampshire, MD;  Location: [facility] CATH LAB;  Service: Cardiovascular;  Laterality: N/A;  . Lower Extremity Arterial Dopplers  08/2013   ABI: R 0.96, L 1.04  . MULTIPLE TOOTH EXTRACTIONS  age 10  . PERCUTANEOUS CORONARY STENT INTERVENTION (PCI-S)  5/'01, 3/'02, 8/'03, 10/'04;   '01 - S660 BMS 2.5 x 9 - dSVG-D1 into D1; '02- post-stent stenosis - 2.5 x 8 Pixel BMS; '8\03: ISR/Thrombosis into native D1 - AngioJet, 2.5 x 13 Pixel; '04 - ISR 95% - covered stented area with Taxus DES 2.5 mm x 20 (2.88)  . PERCUTANEOUS CORONARY STENT INTERVENTION (PCI-S)  08/22/2013    mid SVG-D1 80%; distal stent ~50% ISR; Promus [place] DES 2.75 mm xc 20 mm (2.8 mm)  . PERCUTANEOUS CORONARY STENT INTERVENTION (PCI-S)  08/23/2013   Procedure: PERCUTANEOUS CORONARY STENT INTERVENTION (PCI-S);  Surgeon: Wellington Hampshire, MD;  Location: [facility] CATH LAB;  Service: Cardiovascular;;  . PORT-A-CATH REMOVAL     kidney  . TRANSTHORACIC ECHOCARDIOGRAM  02/2013   NL LV Size, EF 55-60%; MAC with Mild MR.  . TUBAL LIGATION    . wrist fistula repair Left    dialysis for one year    OB History    No data available       Home Medications    Prior to Admission medications   Medication Sig Start Date End Date Taking? Authorizing Provider  acetaminophen (TYLENOL) 500 MG tablet Take 1 tablet (500 mg total) by mouth every 6 (six) hours as needed for moderate pain or headache. 10/17/16  Yes Mauricio Gerome Apley, MD  azaTHIOprine (IMURAN) 50 MG tablet Take 125 mg by mouth every evening.    Yes Historical Provider, MD  budesonide-formoterol (SYMBICORT) 160-4.5 MCG/ACT inhaler Inhale 2 puffs into the lungs 2 (two) times daily. 06/16/16 07/17/17 Yes Collene Gobble, MD  calcitRIOL (ROCALTROL) 0.25 MCG capsule Take 0.25 mcg by mouth every morning.    Yes Historical Provider, MD  carbamazepine (TEGRETOL) 200 MG tablet Take 1 tablet (200 mg total) by mouth 2 (two) times daily. 10/17/16  Yes Mauricio Gerome Apley, MD  clopidogrel (PLAVIX) 75 MG tablet TAKE 1 TABLET (75 MG TOTAL) BY MOUTH DAILY. 02/11/16  Yes [name], MD  cyclobenzaprine (FLEXERIL) 10 MG tablet Take 10 mg by mouth 2 (two) times daily.    Yes Historical Provider, MD  diltiazem (CARDIZEM CD) 120 MG 24 hr capsule Take 1 capsule (120 mg total) by mouth daily. NEED OV. Patient taking differently: Take 120 mg by mouth daily.  08/13/16  Yes [name], MD  DULoxetine (CYMBALTA) 30 MG capsule TAKE 2 CAPSULES (60 MG TOTAL) BY MOUTH EVERY MORNING. 09/29/16  Yes Meredith Staggers, MD  ferrous sulfate 325 (65 FE) MG tablet Take 1 tablet  (325 mg total) by mouth 3 (three) times daily with meals. 10/17/16  Yes Mauricio Gerome Apley, MD  fluticasone (FLONASE) 50 MCG/ACT nasal spray PLACE 2 SPRAYS  INTO THE NOSE DAILY Patient taking differently: Place 2 sprays into each nostril two times a day 08/28/16  Yes Collene Gobble, MD  glimepiride (AMARYL) 4 MG tablet Take 4 mg by mouth every morning. 04/20/16  Yes Historical Provider, MD  insulin glargine (LANTUS) 100 UNIT/ML injection Inject 8 Units into the skin See admin instructions. In the morning as needed for BGL of 160 or greater   Yes Historical Provider, MD  LORazepam (ATIVAN) 1 MG tablet Take 1 mg by mouth every morning.    Yes Historical Provider, MD  metFORMIN (GLUCOPHAGE) 500 MG tablet Take 500 mg by mouth 2 (two) times daily. 10/03/16  Yes Historical Provider, MD  metoprolol tartrate (LOPRESSOR) 25 MG tablet Take half tablet (12.5 mg total) by mouth daily. Take whole tablet (25 mg total) by mouth daily if SBP > 150. Patient taking differently: Take 12.5-25 mg by mouth See admin instructions. Take half tablet (12.5 mg total) by mouth daily. Take whole tablet (25 mg total) by mouth daily if SBP > 150. 02/20/16  Yes Eileen Stanford, PA-C  montelukast (SINGULAIR) 10 MG tablet TAKE 1 TABLET (10 MG TOTAL) BY MOUTH DAILY. 07/06/16  Yes Collene Gobble, MD  nitroGLYCERIN (NITROSTAT) 0.4 MG SL tablet Place 1 tablet (0.4 mg total) under the tongue every 5 (five) minutes as needed for chest pain. 09/11/13  Yes [name], MD  omeprazole (PRILOSEC) 40 MG capsule Take 40 mg by mouth every morning.    Yes Historical Provider, MD  oxyCODONE (OXY IR/ROXICODONE) 5 MG immediate release tablet Take 1 tablet (5 mg total) by mouth every 8 (eight) hours as needed for severe pain. 05/19/16  Yes Meredith Staggers, MD  predniSONE (DELTASONE) 5 MG tablet Take 5 mg by mouth every morning.    Yes Historical Provider, MD  promethazine (PHENERGAN) 25 MG tablet Take 25 mg by mouth at bedtime as needed for nausea or  vomiting. For nausea and sleep   Yes Historical Provider, MD  rosuvastatin (CRESTOR) 20 MG tablet TAKE 1 TABLET (20 MG TOTAL) BY MOUTH DAILY. 06/29/16  Yes [name], MD  sertraline (ZOLOFT) 100 MG tablet Take 100 mg by mouth every morning.   Yes Historical Provider, MD  VENTOLIN HFA 108 (90 Base) MCG/ACT inhaler INHALE 2 PUFFS INTO THE LUNGS EVERY 6 (SIX) HOURS AS NEEDED FOR WHEEZING OR SHORTNESS OF BREATH. 10/13/16  Yes Collene Gobble, MD  clotrimazole (MYCELEX) 10 MG troche Take 1 tablet (10 mg total) by mouth 5 (five) times daily. 10/20/16   Recardo Evangelist, PA-C  fluconazole (DIFLUCAN) 100 MG tablet Take 2 tablets (200 mg total) by mouth daily. Patient not taking: Reported on 10/20/2016 10/17/16 10/31/16  Tawni Millers, MD  ondansetron (ZOFRAN ODT) 4 MG disintegrating tablet Take 1 tablet (4 mg total) by mouth every 8 (eight) hours as needed for nausea or vomiting. 10/20/16   Recardo Evangelist, PA-C  ondansetron (ZOFRAN ODT) 4 MG disintegrating tablet Take 1 tablet (4 mg total) by mouth every 8 (eight) hours as needed for nausea or vomiting. 10/20/16   Recardo Evangelist, PA-C    Family History Family History  Problem Relation Age of Onset  . Cancer Mother     liver  . Heart disease Father   . Cancer Father     colon  . Arrhythmia Brother     Atrial Fibrillation  . Arrhythmia Paternal Aunt     Atrial Fibrillation    Social History Social  History  Substance Use Topics  . Smoking status: Former Smoker    Packs/day: 1.00    Years: 30.00    Types: Cigarettes    Quit date: 08/17/2002  . Smokeless tobacco: Never Used  . Alcohol use No     Allergies   Codeine; Erythromycin; Hydromorphone hcl; Morphine and related; Nalbuphine; Niaspan [niacin er]; Sulfonamide derivatives; and Tetracycline   Review of Systems Review of Systems  Constitutional: Positive for activity change, appetite change, chills, fatigue and fever.  HENT:       Epistaxis  Respiratory: Positive for cough.  Negative for shortness of breath.   Cardiovascular: Positive for palpitations. Negative for chest pain.  Gastrointestinal: Positive for abdominal pain, blood in stool, diarrhea, nausea and vomiting.  Genitourinary: Positive for decreased urine volume. Negative for dysuria.  Neurological: Positive for weakness. Negative for syncope.  All other systems reviewed and are negative.    Physical Exam Updated Vital Signs BP 106/81   Pulse 82   Temp 98.4 F (36.9 C) (Oral)   Resp 23   SpO2 100%   Physical Exam  Constitutional: She is oriented to person, place, and time. She appears well-developed and well-nourished. No distress.  HENT:  Head: Normocephalic and atraumatic.  Eyes: Conjunctivae are normal. Pupils are equal, round, and reactive to light. Right eye exhibits no discharge. Left eye exhibits no discharge. No scleral icterus.  Neck: Normal range of motion.  Cardiovascular: Normal rate and regular rhythm.  Exam reveals no gallop and no friction rub.   No murmur heard. Pulmonary/Chest: Effort normal and breath sounds normal. No respiratory distress. She has no wheezes. She has no rales. She exhibits no tenderness.  Abdominal: Soft. Bowel sounds are normal. She exhibits no distension and no mass. There is tenderness. There is no rebound and no guarding. No hernia.  Periumbilical tenderness  Neurological: She is alert and oriented to person, place, and time.  Lying on stretcher in NAD. GCS 15. Speaks in a clear voice. Cranial nerves II through XII grossly intact. 5/5 strength in all extremities. Sensation fully intact.  Bilateral finger-to-nose intact.    Skin: Skin is warm and dry.  Psychiatric: Her behavior is normal. She exhibits a depressed mood.  Nursing note and vitals reviewed.    ED Treatments / Results  Labs (all labs ordered are listed, but only abnormal results are displayed) Labs Reviewed  CBC - Abnormal; Notable for the following:       Result Value   RBC 3.62 (*)     Hemoglobin 9.3 (*)    HCT 31.1 (*)    MCH 25.7 (*)    MCHC 29.9 (*)    RDW 16.8 (*)    All other components within normal limits  COMPREHENSIVE METABOLIC PANEL - Abnormal; Notable for the following:    Potassium 3.2 (*)    Glucose, Bld 123 (*)    Albumin 3.2 (*)    ALT 13 (*)    All other components within normal limits  I-STAT CG4 LACTIC ACID, ED - Abnormal; Notable for the following:    Lactic Acid, Venous 2.21 (*)    All other components within normal limits  URINALYSIS, ROUTINE W REFLEX MICROSCOPIC  LIPASE, BLOOD  CBG MONITORING, ED  I-STAT CG4 LACTIC ACID, ED  TYPE AND SCREEN    EKG  EKG Interpretation None       Radiology Dg Chest 2 View  Result Date: 10/20/2016 CLINICAL DATA:  Cough, pneumonia. EXAM: CHEST  2 VIEW COMPARISON:  Radiographs of October 13, 2016. CT scan of October 14, 2016. FINDINGS: The heart size and mediastinal contours are within normal limits. Status post coronary artery bypass graft. No pneumothorax or pleural effusion is noted. Stable nodular densities are noted in the right upper lobe as described on prior exam. Left lung appears essentially clear at this time. The visualized skeletal structures are unremarkable. IMPRESSION: Stable right upper lobe nodular densities as noted on prior exam which may be inflammatory or infectious in etiology. Continued CT or radiographic follow-up is recommended to ensure resolution. Electronically Signed   By: Marijo Conception, M.D.   On: 10/20/2016 11:55    Procedures Procedures (including critical care time)  Medications Ordered in ED Medications  sodium chloride 0.9 % bolus 1,000 mL (0 mLs Intravenous Stopped 10/20/16 1543)  promethazine (PHENERGAN) injection 12.5 mg (12.5 mg Intravenous Given 10/20/16 1553)  acetaminophen (TYLENOL) tablet 650 mg (650 mg Oral Given 10/20/16 1554)    Initial Impression / Assessment and Plan / ED Course  I have reviewed the triage vital signs and the nursing notes.  Pertinent  labs & imaging results that were available during my care of the patient were reviewed by me and considered in my medical decision making (see chart for details).  61 year old female presents with ongoing weakness and now cough with hemoptysis and N/V with hematemesis. Vital signs are normal. Hgb is improved from three days ago. (9.3 today compared to 8.3 on 3/3). CMP remarkable for mild hypokalemia which is improved and mild hyperglycemia. Lactic acid is elevated at 2.21. Repeat is 1.07 after a liter of fluid. Unclear why she is feeling weak. She seems depressed - may be a psych component. Also may be deconditioning from being in hospital and low levels of activity since she's been home.  Lung nodules - can be worked up as outpatient. AFB smear and fungal culture were negative. She has finished course of antibiotics. Hemoptysis likely from cough and being on Plavix. Also may be from nose bleed. Supposed to f/u with pulm this week but she has not made appt yet. Instructed her to make appt as soon as possible and she verbalized understanding.   Abdominal pain - also unclear etiology. She has periumbilical tenderness but this has been ongoing for a couple weeks now. More concerning is her N/V uncontrolled by antiemetics at home. PO challenge was attempted and she had episode of emesis. Phenergan ordered and pt requested Tylenol for pain.   Pt still failing PO challenge. Spoke with Dr. Oletta Lamas who stated the duodenal adenoma needs to come out. He consulted on patient and recommended Mycelex if patient does not want Diflucan. He does not think lap band is contributing to her N/V. Dr. Excell Seltzer came and loosened lap band just in case. Will admit for further management of intractable N/V.  Final Clinical Impressions(s) / ED Diagnoses   Final diagnoses:  Generalized weakness  Nausea and vomiting, intractability of vomiting not specified, unspecified vomiting type  Cough with hemoptysis    New  Prescriptions New Prescriptions   No medications on file     Recardo Evangelist, PA-C 10/21/16 3354    Virgel Manifold, MD 11/03/16 1251

## 2016-10-20 NOTE — ED Notes (Signed)
Pt. Returned from x-ray, placed in a gown.  Warm blankets given ,lab at the bedside

## 2016-10-20 NOTE — Telephone Encounter (Signed)
I let her know but she wants me to tell Dr Naaman Plummer she is back in hospital now.

## 2016-10-20 NOTE — ED Notes (Signed)
Hospitalist at bedside 

## 2016-10-20 NOTE — Telephone Encounter (Signed)
Medication she is worried about is tegretol

## 2016-10-20 NOTE — ED Notes (Signed)
ED Provider at bedside. 

## 2016-10-20 NOTE — Discharge Instructions (Signed)
Please make appointment with pulmonologist and GI doctor Take Zofran as needed for nausea Use Mycelex for fungal infection of throat Return for worsening symptoms

## 2016-10-20 NOTE — Consult Note (Signed)
[facility] CONSULT Reason for consult: nausea and vomiting Referring Physician: emergency room. PCP: Dr Jimmy Footman. Primary G.I.: Dr. Vincenza Hews Katherine [name] is an 61 y.o. female.  HPI: she is a complicated patient who is justice discharged from the hospital about a week ago. She has multiple medical problems including chronic anxiety, renal transplant, CAD status post CABG, bilateral carotid stenosis. She's had previous angioplasties and stents, COPD, depression, fibromyalgia, type II diabetes, obesity status post laparoscopic band by Dr Excell Seltzer. She was just discharge from the hospital a few days ago. Her diagnoses or lung nodules which were due and sputum cultures were sent. The patient is immunosuppressed and was seen by pulmonary and ID. She was to follow up as an outpatient. She has a history of asthma, paroxysmal atrial fibrillation with diltiazem for rate control and no anticoagulation. She had nausea and vomiting and underwent EGD by Dr. 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 The patient was prescription with Diflucan but did not take this because this was felt to interfere with her Tegretol. She came back in to the emergency room stating that she has been vomiting ever since she has been home and has been unable to keep down medications or Jell-O. She is afraid she will become dehydrated and damage her transplanted kidney. She is concerned that she is becoming dehydrated. Her home medications include azathioprine, budesonide, Tegretol,clopidogrel, diltiazem, Cymbalta, Amaryl, metformin, lorazepam, Lopressor prednisone, oxycodone, and omeprazole. She has been taking Phenergan for PRN nausea which is not been working. She does have some burning in her chest when she vomits.  Past Medical History:  Diagnosis Date  . Anxiety   . Bilateral carotid artery stenosis    Carotid duplex 02/8587: 5-02% LICA, 77-41% RICA,  >28% RECA, f/u 1 yr suggested  . CAD (coronary artery disease) of bypass graft 5/01; 3/'02, 8/'03, 10/'04; 1/15   PCI x 5 to SVG-D1   . CAD in native artery 07/1993    3 Vessel Disease (LAD-D1 & RCA) -- CABG  . CAD S/P percutaneous coronary angioplasty    PCI to SVG-D1 insertion/native D1 x 4 = '01 -(S660 BMS 2.5 x 9 - insertion into D1; '02 - distal overlap ACS Pixel 2.5 x 8  BMS; '03 distal/native ISR/Thrombosis - Pixel 2.5 x 13; '04 - ISR-  Taxus 2.5 x 20 (covered all);; 1/15 - mid SVG-D1 (50% distal ISR) - Promus P 2.75 x 20 -- 2.8 mm  . COPD mixed type ([place])    Followed by Dr. Lamonte Sakai "pulmonologist said no COPD"  . Depression   . Depression with anxiety   . Diabetes mellitus type 2 in obese ([place])   . Dyslipidemia, goal LDL below 70    08/2012: TC 137, TG 200, HDL 32!, LDL 45; on statin (followed by Dr.Deterding)  . Enlarged heart    "told one time"  . ESRD (end stage renal disease) ([place]) 1991   s/p Cadaveric Renal Transplant [facility] - Dr. Jimmy Footman)   . Family history of adverse reaction to anesthesia    mom's bp dropped during/after anesthesia  . Fibromyalgia   . GERD (gastroesophageal reflux disease)   . Glomerulonephritis, chronic, rapidly progressive 68  . 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 07/1993   Rescue PTCA of RCA -- referred for CABG.  . H/O: GI bleed   . Hypertension associated with diabetes ([place])   . MRSA (methicillin resistant staph aureus) culture  positive   . Obesity    s/p Lap Band 04/2004- Port Replacement 10/09 & 2/10 for infection; Dr. Excell Seltzer.  . OSA on CPAP    oxygen @night  @ 2L  . PAD (peripheral artery disease) ([place]) 08/2013   LEA Dopplers to be read by Dr. Fletcher Anon  . PAF (paroxysmal atrial fibrillation) ([place])    Noted on CardioNet Monitor 11/5-18 - rate ~112  . Recurrent boils    Bilateral Groin  . Rheumatoid arthritis ([place])    Per Patient Report; associated with OA  . 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 Servando Snare: LIMA-LAD,  SVG-bifurcatingD1, SVG-rPDA  . S/p cadaver renal transplant [place]  . Stroke [facility]) 2012   "right eye stroke- half blind now"  . Unstable angina ([place]) 5/01; 3/'02, 8/'03, 10/'04; 1/15   x 5 occurences since Inf-Post STEMI in 1994    Past Surgical History:  Procedure Laterality Date  . CARDIAC CATHETERIZATION  5/'01, 3/'02, 8/'03, 10/'04; 1/'15   08/22/2013: LAD & RCA 100%; LIMA-LAD & SVG-rPDA patent; Cx-- OM1 60%, OM2 ostial ~50%; SVG-D1 - 80% mid, 50% distal ISR --PCI  . CARDIAC CATHETERIZATION  5/'01, 3/'02, 8/'03, 10/'04; 1/'15   08/22/2013: LAD & RCA 100%; LIMA-LAD & SVG-rPDA patent; Cx-- OM1 60%, OM2 ostial ~50%; SVG-D1 - 80% mid, 50% distal ISR --PCI  . CATHETER REMOVAL    . CHOLECYSTECTOMY N/A 10/29/2014   Procedure: LAPAROSCOPIC CHOLECYSTECTOMY WITH INTRAOPERATIVE CHOLANGIOGRAM;  Surgeon: Excell Seltzer, MD;  Location: [facility] ORS;  Service: General;  Laterality: N/A;  . CORONARY ANGIOPLASTY  1994   x5  . CORONARY ARTERY BYPASS GRAFT  1995   LIMA-LAD, SVG-RPDA, SVG-D1  . ESOPHAGOGASTRODUODENOSCOPY N/A 10/15/2016   Procedure: ESOPHAGOGASTRODUODENOSCOPY (EGD);  Surgeon: Wilford Corner, MD;  Location: [facility] ENDOSCOPY;  Service: Endoscopy;  Laterality: N/A;  . INCISE AND DRAIN ABCESS    . KIDNEY TRANSPLANT  1991  . LAPAROSCOPIC GASTRIC BANDING  04/2004; 10/'09, 2/'10   Port Replacement x 2  . LEFT HEART CATHETERIZATION WITH CORONARY/GRAFT ANGIOGRAM N/A 08/23/2013   Procedure: LEFT HEART CATHETERIZATION WITH Beatrix Fetters;  Surgeon: Wellington Hampshire, MD;  Location: [facility] CATH LAB;  Service: Cardiovascular;  Laterality: N/A;  . Lower Extremity Arterial Dopplers  08/2013   ABI: R 0.96, L 1.04  . MULTIPLE TOOTH EXTRACTIONS  age 68  . PERCUTANEOUS CORONARY STENT INTERVENTION (PCI-S)  5/'01, 3/'02, 8/'03, 10/'04;   '01 - S660 BMS 2.5 x 9 - dSVG-D1 into D1; '02- post-stent stenosis - 2.5 x 8 Pixel BMS; '8\03: ISR/Thrombosis into native D1 - AngioJet, 2.5 x 13 Pixel; '04 - ISR 95% -  covered stented area with Taxus DES 2.5 mm x 20 (2.88)  . PERCUTANEOUS CORONARY STENT INTERVENTION (PCI-S)  08/22/2013   mid SVG-D1 80%; distal stent ~50% ISR; Promus Prermier DES 2.75 mm xc 20 mm (2.8 mm)  . PERCUTANEOUS CORONARY STENT INTERVENTION (PCI-S)  08/23/2013   Procedure: PERCUTANEOUS CORONARY STENT INTERVENTION (PCI-S);  Surgeon: Wellington Hampshire, MD;  Location: [facility] CATH LAB;  Service: Cardiovascular;;  . PORT-A-CATH REMOVAL     kidney  . TRANSTHORACIC ECHOCARDIOGRAM  02/2013   NL LV Size, EF 55-60%; MAC with Mild MR.  . TUBAL LIGATION    . wrist fistula repair Left    dialysis for one year    Family History  Problem Relation Age of Onset  . Cancer Mother     liver  . Heart disease Father   . Cancer Father     colon  .  Arrhythmia Brother     Atrial Fibrillation  . Arrhythmia Paternal Aunt     Atrial Fibrillation    Social History:  reports that she quit smoking about 14 years ago. Her smoking use included Cigarettes. She has a 30.00 pack-year smoking history. She has never used smokeless tobacco. She reports that she does not drink alcohol or use drugs.  Allergies:  Allergies  Allergen Reactions  . Codeine Nausea And Vomiting  . Erythromycin Nausea And Vomiting  . Hydromorphone Hcl Nausea And Vomiting  . Morphine And Related Nausea And Vomiting  . Nalbuphine Nausea And Vomiting  . Niaspan [Niacin Er] Other (See Comments)    Mouth blisters  . Sulfa Antibiotics Nausea Only and Other (See Comments)    "Tears up stomach"  . Sulfonamide Derivatives Other (See Comments)    Reaction: per patient "tears her stomach up"  . Tetracycline Hives  . Tape Rash    No "plastic" tape," please    Medications; Prior to Admission medications   Medication Sig Start Date End Date Taking? Authorizing Provider  acetaminophen (TYLENOL) 500 MG tablet Take 1 tablet (500 mg total) by mouth every 6 (six) hours as needed for moderate pain or headache. 10/17/16  Yes Mauricio Gerome Apley, MD   azaTHIOprine (IMURAN) 50 MG tablet Take 125 mg by mouth every evening.    Yes Historical Provider, MD  budesonide-formoterol (SYMBICORT) 160-4.5 MCG/ACT inhaler Inhale 2 puffs into the lungs 2 (two) times daily. 06/16/16 07/17/17 Yes Collene Gobble, MD  calcitRIOL (ROCALTROL) 0.25 MCG capsule Take 0.25 mcg by mouth every morning.    Yes Historical Provider, MD  carbamazepine (TEGRETOL) 200 MG tablet Take 1 tablet (200 mg total) by mouth 2 (two) times daily. 10/17/16  Yes Mauricio Gerome Apley, MD  clopidogrel (PLAVIX) 75 MG tablet TAKE 1 TABLET (75 MG TOTAL) BY MOUTH DAILY. 02/11/16  Yes [name], MD  cyclobenzaprine (FLEXERIL) 10 MG tablet Take 10 mg by mouth 2 (two) times daily.    Yes Historical Provider, MD  diltiazem (CARDIZEM CD) 120 MG 24 hr capsule Take 1 capsule (120 mg total) by mouth daily. NEED OV. Patient taking differently: Take 120 mg by mouth daily.  08/13/16  Yes [name], MD  DULoxetine (CYMBALTA) 30 MG capsule TAKE 2 CAPSULES (60 MG TOTAL) BY MOUTH EVERY MORNING. 09/29/16  Yes Meredith Staggers, MD  ferrous sulfate 325 (65 FE) MG tablet Take 1 tablet (325 mg total) by mouth 3 (three) times daily with meals. 10/17/16  Yes Mauricio Gerome Apley, MD  fluticasone (FLONASE) 50 MCG/ACT nasal spray PLACE 2 SPRAYS INTO THE NOSE DAILY Patient taking differently: Place 2 sprays into each nostril two times a day 08/28/16  Yes Collene Gobble, MD  glimepiride (AMARYL) 4 MG tablet Take 4 mg by mouth every morning. 04/20/16  Yes Historical Provider, MD  insulin glargine (LANTUS) 100 UNIT/ML injection Inject 8 Units into the skin See admin instructions. In the morning as needed for BGL of 160 or greater   Yes Historical Provider, MD  LORazepam (ATIVAN) 1 MG tablet Take 1 mg by mouth every morning.    Yes Historical Provider, MD  metFORMIN (GLUCOPHAGE) 500 MG tablet Take 500 mg by mouth 2 (two) times daily. 10/03/16  Yes Historical Provider, MD  metoprolol tartrate (LOPRESSOR) 25 MG tablet  Take half tablet (12.5 mg total) by mouth daily. Take whole tablet (25 mg total) by mouth daily if SBP > 150. Patient taking differently: Take 12.5-25 mg  by mouth See admin instructions. Take half tablet (12.5 mg total) by mouth daily. Take whole tablet (25 mg total) by mouth daily if SBP > 150. 02/20/16  Yes Eileen Stanford, PA-C  montelukast (SINGULAIR) 10 MG tablet TAKE 1 TABLET (10 MG TOTAL) BY MOUTH DAILY. 07/06/16  Yes Collene Gobble, MD  nitroGLYCERIN (NITROSTAT) 0.4 MG SL tablet Place 1 tablet (0.4 mg total) under the tongue every 5 (five) minutes as needed for chest pain. 09/11/13  Yes [name], MD  omeprazole (PRILOSEC) 40 MG capsule Take 40 mg by mouth every morning.    Yes Historical Provider, MD  oxyCODONE (OXY IR/ROXICODONE) 5 MG immediate release tablet Take 1 tablet (5 mg total) by mouth every 8 (eight) hours as needed for severe pain. 05/19/16  Yes Meredith Staggers, MD  predniSONE (DELTASONE) 5 MG tablet Take 5 mg by mouth every morning.    Yes Historical Provider, MD  promethazine (PHENERGAN) 25 MG tablet Take 25 mg by mouth at bedtime as needed for nausea or vomiting. For nausea and sleep   Yes Historical Provider, MD  rosuvastatin (CRESTOR) 20 MG tablet TAKE 1 TABLET (20 MG TOTAL) BY MOUTH DAILY. 06/29/16  Yes [name], MD  sertraline (ZOLOFT) 100 MG tablet Take 100 mg by mouth every morning.   Yes Historical Provider, MD  VENTOLIN HFA 108 (90 Base) MCG/ACT inhaler INHALE 2 PUFFS INTO THE LUNGS EVERY 6 (SIX) HOURS AS NEEDED FOR WHEEZING OR SHORTNESS OF BREATH. 10/13/16  Yes Collene Gobble, MD  clotrimazole (MYCELEX) 10 MG troche Take 1 tablet (10 mg total) by mouth 5 (five) times daily. 10/20/16   Recardo Evangelist, PA-C  fluconazole (DIFLUCAN) 100 MG tablet Take 2 tablets (200 mg total) by mouth daily. Patient not taking: Reported on 10/20/2016 10/17/16 10/31/16  Tawni Millers, MD  ondansetron (ZOFRAN ODT) 4 MG disintegrating tablet Take 1 tablet (4 mg total) by mouth  every 8 (eight) hours as needed for nausea or vomiting. 10/20/16   Recardo Evangelist, PA-C  ondansetron (ZOFRAN ODT) 4 MG disintegrating tablet Take 1 tablet (4 mg total) by mouth every 8 (eight) hours as needed for nausea or vomiting. 10/20/16   Recardo Evangelist, PA-C    PRN Meds  Results for orders placed or performed during the hospital encounter of 10/20/16 (from the past 48 hour(s))  CBC     Status: Abnormal   Collection Time: 10/20/16 11:56 AM  Result Value Ref Range   WBC 4.5 4.0 - 10.5 K/uL   RBC 3.62 (L) 3.87 - 5.11 MIL/uL   Hemoglobin 9.3 (L) 12.0 - 15.0 g/dL   HCT 31.1 (L) 36.0 - 46.0 %   MCV 85.9 78.0 - 100.0 fL   MCH 25.7 (L) 26.0 - 34.0 pg   MCHC 29.9 (L) 30.0 - 36.0 g/dL   RDW 16.8 (H) 11.5 - 15.5 %   Platelets 293 150 - 400 K/uL  Comprehensive metabolic panel     Status: Abnormal   Collection Time: 10/20/16 11:56 AM  Result Value Ref Range   Sodium 142 135 - 145 mmol/L   Potassium 3.2 (L) 3.5 - 5.1 mmol/L   Chloride 107 101 - 111 mmol/L   CO2 22 22 - 32 mmol/L   Glucose, Bld 123 (H) 65 - 99 mg/dL   BUN 8 6 - 20 mg/dL   Creatinine, Ser 0.86 0.44 - 1.00 mg/dL   Calcium 9.5 8.9 - 10.3 mg/dL   Total Protein 7.1  6.5 - 8.1 g/dL   Albumin 3.2 (L) 3.5 - 5.0 g/dL   AST 24 15 - 41 U/L   ALT 13 (L) 14 - 54 U/L   Alkaline Phosphatase 120 38 - 126 U/L   Total Bilirubin 0.5 0.3 - 1.2 mg/dL   GFR calc non Af Amer >60 >60 mL/min   GFR calc Af Amer >60 >60 mL/min    Comment: (NOTE) The eGFR has been calculated using the CKD EPI equation. This calculation has not been validated in all clinical situations. eGFR's persistently <60 mL/min signify possible Chronic Kidney Disease.    Anion gap 13 5 - 15  Lipase, blood     Status: None   Collection Time: 10/20/16 11:56 AM  Result Value Ref Range   Lipase 18 11 - 51 U/L  Type and screen [place]     Status: None   Collection Time: 10/20/16 11:58 AM  Result Value Ref Range   ABO/RH(D) O POS    Antibody  Screen NEG    Sample Expiration 10/23/2016   I-Stat CG4 Lactic Acid, ED     Status: Abnormal   Collection Time: 10/20/16 12:11 PM  Result Value Ref Range   Lactic Acid, Venous 2.21 (HH) 0.5 - 1.9 mmol/L   Comment NOTIFIED PHYSICIAN   CBG monitoring, ED     Status: None   Collection Time: 10/20/16 12:42 PM  Result Value Ref Range   Glucose-Capillary 99 65 - 99 mg/dL  I-Stat CG4 Lactic Acid, ED     Status: None   Collection Time: 10/20/16  2:41 PM  Result Value Ref Range   Lactic Acid, Venous 1.07 0.5 - 1.9 mmol/L  Urinalysis, Routine w reflex microscopic     Status: None   Collection Time: 10/20/16  3:43 PM  Result Value Ref Range   Color, Urine YELLOW YELLOW   APPearance CLEAR CLEAR   Specific Gravity, Urine 1.012 1.005 - 1.030   pH 6.0 5.0 - 8.0   Glucose, UA NEGATIVE NEGATIVE mg/dL   Hgb urine dipstick NEGATIVE NEGATIVE   Bilirubin Urine NEGATIVE NEGATIVE   Ketones, ur NEGATIVE NEGATIVE mg/dL   Protein, ur NEGATIVE NEGATIVE mg/dL   Nitrite NEGATIVE NEGATIVE   Leukocytes, UA NEGATIVE NEGATIVE    Dg Chest 2 View  Result Date: 10/20/2016 CLINICAL DATA:  Cough, pneumonia. EXAM: CHEST  2 VIEW COMPARISON:  Radiographs of October 13, 2016. CT scan of October 14, 2016. FINDINGS: The heart size and mediastinal contours are within normal limits. Status post coronary artery bypass graft. No pneumothorax or pleural effusion is noted. Stable nodular densities are noted in the right upper lobe as described on prior exam. Left lung appears essentially clear at this time. The visualized skeletal structures are unremarkable. IMPRESSION: Stable right upper lobe nodular densities as noted on prior exam which may be inflammatory or infectious in etiology. Continued CT or radiographic follow-up is recommended to ensure resolution. Electronically Signed   By: Marijo Conception, M.D.   On: 10/20/2016 11:55   All spending            Blood pressure 134/74, pulse 78, temperature 98.4 F (36.9  C), temperature source Oral, resp. rate 18, SpO2 100 %.  Physical exam:   General-- talkative white female no distress ENT-- nonicteric, no obvious oral thrush Neck-- supple Heart-- regular rate and rhythm without murmurs gallops Lungs-- clear Abdomen-- nondistended soft and nontender Psych-- alert and oriented but tearful   Assessment: 1. Nausea and  vomiting. The cause this is not clear. She just had EGD a few days ago revealing duodenal adenoma and fungal esophagitis. The laparoscopic band did not clearly seem to be compressing and causing outlet obstruction so I don't know if removing fluid from the laparoscopic band will help. 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 The choices are to have this removed surgically or to consider EMR which is not done here [facility]. She would need to go to [facility], [facility] etc. where this procedure is done. I have discussed this with her.  3. Renal transplant with resulting immunosuppression 4. Type II diabetes 5. History of fibromyalgia, depression, anxiety 6. CAD status post CABG with stents and history of atrial fibrillation 7. History of laparoscopic banding  Plan: 1. Don't feel that she needs another EGD now. If she doesn't absolutely needed Tegretol she could be treated with Diflucan regarding her fungal esophagitis. This is not practical, she has taken Mycelex trouches in past.   Nielle Duford JR,Gilliam Hawkes L 10/20/2016, 4:39 PM   This note was created using voice recognition software and minor errors may Have occurred unintentionally. Pager: [phone] If no answer or after hours call [phone]

## 2016-10-20 NOTE — ED Notes (Signed)
Patient is seen in the emergency department at her request. She presents with persistent regurgitation, vomiting and heartburn. I know her well status post lap band placement in 2004 with very good long-term weight loss. She was recently hospitalized with apparent pneumonia/pulmonary nodules, Candida esophagitis and was found to have a villous adenoma of the duodenum. I reviewed her EGD which shows no significant narrowing at the lap band site. Chest x-ray today includes the area of the lap band which appears to be in normal position without evidence of slip.  It is not clear that her lap band is contributing to her symptoms. This may be more of a result of her Candida esophagitis. However in order to minimize any chance of the lap band is contributing to her symptoms under sterile technique I accessed her band and removed all fluid from the band which was only 2 mL.   Edward Jolly MD, FACS  10/20/2016, 6:12 PM

## 2016-10-20 NOTE — ED Notes (Signed)
Provider at bedside

## 2016-10-21 DIAGNOSIS — B3781 Candidal esophagitis: Secondary | ICD-10-CM | POA: Diagnosis not present

## 2016-10-21 DIAGNOSIS — D132 Benign neoplasm of duodenum: Secondary | ICD-10-CM | POA: Diagnosis not present

## 2016-10-21 DIAGNOSIS — R112 Nausea with vomiting, unspecified: Secondary | ICD-10-CM | POA: Diagnosis not present

## 2016-10-21 LAB — BASIC METABOLIC PANEL
Anion gap: 11 (ref 5–15)
BUN: 8 mg/dL (ref 6–20)
CO2: 24 mmol/L (ref 22–32)
CREATININE: 0.72 mg/dL (ref 0.44–1.00)
Calcium: 9 mg/dL (ref 8.9–10.3)
Chloride: 105 mmol/L (ref 101–111)
GFR calc Af Amer: >60 mL/min (ref >60)
Glucose, Bld: 104 mg/dL — ABNORMAL HIGH (ref 65–99)
Potassium: 3.4 mmol/L — ABNORMAL LOW (ref 3.5–5.1)
SODIUM: 140 mmol/L (ref 135–145)

## 2016-10-21 LAB — CBC
HCT: 27.2 % — ABNORMAL LOW (ref 36.0–46.0)
Hemoglobin: 8.2 g/dL — ABNORMAL LOW (ref 12.0–15.0)
MCH: 25.5 pg — ABNORMAL LOW (ref 26.0–34.0)
MCHC: 30.1 g/dL (ref 30.0–36.0)
MCV: 84.5 fL (ref 78.0–100.0)
PLATELETS: 248 10*3/uL (ref 150–400)
RBC: 3.22 MIL/uL — ABNORMAL LOW (ref 3.87–5.11)
RDW: 17 % — AB (ref 11.5–15.5)
WBC: 3.3 10*3/uL — AB (ref 4.0–10.5)

## 2016-10-21 LAB — GLUCOSE, CAPILLARY
GLUCOSE-CAPILLARY: 154 mg/dL — AB (ref 65–99)
Glucose-Capillary: 124 mg/dL — ABNORMAL HIGH (ref 65–99)

## 2016-10-21 MED ORDER — SODIUM CHLORIDE 0.9 % IV SOLN
30.0000 meq | Freq: Once | INTRAVENOUS | Status: AC
  Administered 2016-10-21: 30 meq via INTRAVENOUS
  Filled 2016-10-21: qty 15

## 2016-10-21 MED ORDER — ONDANSETRON 4 MG PO TBDP: 4.0000 mg | ORAL_TABLET | Freq: Three times a day (TID) | ORAL | 0 refills | Status: DC | PRN

## 2016-10-21 MED ORDER — CLOTRIMAZOLE 10 MG MT TROC: 10.0000 mg | Freq: Every day | OROMUCOSAL | 0 refills | Status: DC

## 2016-10-21 NOTE — Progress Notes (Signed)
[facility] PROGRESS NOTE Subjective patient states that her nausea and vomiting has improved dramatically since her lap and was decompressed yesterday. She is tolerating liquids without problem.  Objective: Vital signs in last 24 hours: Temp:  [98.2 F (36.8 C)-98.5 F (36.9 C)] 98.2 F (36.8 C) (03/07 0518) Pulse Rate:  [55-94] 64 (03/07 0930) Resp:  [10-26] 18 (03/07 0518) BP: (83-145)/(38-88) 102/52 (03/07 0930) SpO2:  [94 %-100 %] 98 % (03/07 0859) Weight:  [81.2 kg (179 lb 1.6 oz)-82.7 kg (182 lb 4.8 oz)] 81.2 kg (179 lb 1.6 oz) (03/06 2216) Last BM Date: 10/19/16  Intake/Output from previous day: 03/06 0701 - 03/07 0700 In: 6226 [P.O.:240; I.V.:1000; IV Piggyback:265] Out: -  Intake/Output this shift: No intake/output data recorded.  PE: General-- sitting in bed eating clear liquid tray   Lab Results:  Recent Labs  10/20/16 1156 10/21/16 0651  WBC 4.5 3.3*  HGB 9.3* 8.2*  HCT 31.1* 27.2*  PLT 293 248   BMET  Recent Labs  10/20/16 1156 10/21/16 0651  NA 142 140  K 3.2* 3.4*  CL 107 105  CO2 22 24  CREATININE 0.86 0.72   LFT  Recent Labs  10/20/16 1156  PROT 7.1  AST 24  ALT 13*  ALKPHOS 120  BILITOT 0.5   PT/INR No results for input(s): LABPROT, INR in the last 72 hours. PANCREAS  Recent Labs  10/20/16 1156  LIPASE 18         Studies/Results: Dg Chest 2 View  Result Date: 10/20/2016 CLINICAL DATA:  Cough, pneumonia. EXAM: CHEST  2 VIEW COMPARISON:  Radiographs of October 13, 2016. CT scan of October 14, 2016. FINDINGS: The heart size and mediastinal contours are within normal limits. Status post coronary artery bypass graft. No pneumothorax or pleural effusion is noted. Stable nodular densities are noted in the right upper lobe as described on prior exam. Left lung appears essentially clear at this time. The visualized skeletal structures are unremarkable. IMPRESSION: Stable right upper lobe nodular densities as noted  on prior exam which may be inflammatory or infectious in etiology. Continued CT or radiographic follow-up is recommended to ensure resolution. Electronically Signed   By: Marijo Conception, M.D.   On: 10/20/2016 11:55    Medications: I have reviewed the patient's current medications.  Assessment/Plan: 1. Nausea and vomiting. Call seems to be somewhat unclear. EGD did not reveal any clear compression from her lap band and minimal fluid was removed. Not really sure why she is doing better after removal of fluid. She has postcholecystectomy. Don't feel any other diagnostic testing indicated at this time. 2. Duodenal adenoma. Biopsies are benign but this will need to be removed either by EMR at medical center or surgically. The patient would like to discuss this with Dr. [name] so I would have her follow-up with him in the next couple weeks after discharge to discuss which option would be best.   Sopheap Boehle JR,Teairra Millar L 10/21/2016, 10:00 AM  This note was created using voice recognition software. Minor errors may Have occurred unintentionally.  Pager: [phone] If no answer or after hours call [phone]

## 2016-10-21 NOTE — Care Management Important Message (Signed)
Important Message  Patient Details  Name: Katherine [name] MRN: [id number] Date of Birth: 03-07-1956   Medicare Important Message Given:  Yes    Erenest Rasher, RN 10/21/2016, 2:37 PM

## 2016-10-21 NOTE — Progress Notes (Signed)
Pt BP 92/38 P 55 on call physician has been paged about it, will continue to monitor

## 2016-10-21 NOTE — Discharge Summary (Signed)
Physician Discharge Summary  Katherine [name] KKX:[id number] DOB: 21-Apr-1956 DOA: 10/20/2016  PCP: Placido Sou, MD  Admit date: 10/20/2016 Discharge date: 10/21/2016  Time spent: 35 minutes  Recommendations for Outpatient Follow-up:  1. PCP in  1week 2. Pulm Dr.Byrum in few weeks to FU on Pulm nodules 3. [facility] Dr.Buccini-needs plan for    Discharge Diagnoses:    Nausea and Vomiting   Esophageal candidiasis (HCC)   Pulm nodules   Renal transplant disorder   Lung nodules   Anemia   Duodenal adenoma   DM   CAD s/p CABG   Asthma   Lap Band   Discharge Condition: stable  Diet recommendation: DM/Low sodium heart healthy  Filed Weights   10/20/16 2210 10/20/16 2216  Weight: 82.7 kg (182 lb 4.8 oz) 81.2 kg (179 lb 1.6 oz)    History of present illness:   Katherine [name] is a 61 y.o. female with medical history significant of renal transplant on immunosuppressants, DM, CAD s/p CABG and numerous stents, lap band, and asthma who was hospitalized from 2/28-3/3 for symptomatic anemia resulting from a GI bleed.  She presents today with abdominal pain, n/v.  During her prior hospitalization, she was transfused 1 unit PRBC and had an EGD which showed candidal esophagitis and a large duodenal adenoma.  She was supposed to have colonoscopy during that stay but she was unable to tolerate PO contrast so the plan was for her to have that as an outpatient.   She reports that after discharge, her pharmacy called to warn her that Tegretol and Diflucan have a drug interaction that can enhance the side effects of Tegretol.  She takes Tegretol for fibromyalgia, RA, OA.  She is concerned about stopping the Tegretol. Since she was discharged, she developed refractory n/v, inability to take PO.  Unable to keep fluids or any food down  Hospital Course:  Esophageal candidiasis -Patient with EGD-proven esophageal candidiasis during prior hospitalization last week -She went home 3/3 and reports  that she was told by her pharmacy that she should not take Diflucan with Tegretol due to increased risk for side effects, as a result, she has not been taking Diflucan and so developed recurrent n/v -She was seen by Dr.Edwards and due to her reluctance to taper off tegretol, Diflucan was switched to PO clotrimazole by dr.Edwards and She was also seen by Dr.Hoxworth in order to minimize any chance of the lap band is contributing to her symptoms under sterile technique I accessed her band and removed all fluid from the band which was only 2 mL. -she was started on Clotrimazole, clinically much improved in terms of N/V this morning, diet advanced and discharged with plan for Gi FU with Dr.Buccini in 1-2weeks to formulate a plan for her large duodenal adenoma which could also be contributing to some extent, may need tertiary center referral.  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 -plan for referral to  tertiary facility, FU with Dr.Buccini   Consultations:  Howie Ill  [facility] Dr.Hoxworth  Discharge Exam: Vitals:   10/21/16 0930 10/21/16 1425  BP: (!) 102/52 113/60  Pulse:  64 80  Resp:  18  Temp:  98.4 F (36.9 C)    General: AAOx3 Cardiovascular:S1S2/RRR Respiratory: CTAB  Discharge Instructions   Discharge Instructions    Diet - low sodium heart healthy    Complete by:  As directed    Diet Carb Modified    Complete by:  As directed    Increase activity slowly    Complete by:  As directed      Current Discharge Medication  List    START taking these medications   Details  clotrimazole (MYCELEX) 10 MG troche Take 1 tablet (10 mg total) by mouth 5 (five) times daily. Qty: 70 tablet, Refills: 0    ondansetron (ZOFRAN ODT) 4 MG disintegrating tablet Take 1 tablet (4 mg total) by mouth every 8 (eight) hours as needed for nausea or vomiting. Qty: 10 tablet, Refills: 0      CONTINUE these medications which have NOT CHANGED   Details  acetaminophen (TYLENOL) 500 MG tablet Take 1 tablet (500 mg total) by mouth every 6 (six) hours as needed for moderate pain or headache. Qty: 30 tablet, Refills: 0    azaTHIOprine (IMURAN) 50 MG tablet Take 125 mg by mouth every evening.     budesonide-formoterol (SYMBICORT) 160-4.5 MCG/ACT inhaler Inhale 2 puffs into the lungs 2 (two) times daily. Qty: 1 Inhaler, Refills: 0    calcitRIOL (ROCALTROL) 0.25 MCG capsule Take 0.25 mcg by mouth every morning.     carbamazepine (TEGRETOL) 200 MG tablet Take 1 tablet (200 mg total) by mouth 2 (two) times daily. Qty: 10 tablet, Refills: 0   Associated Diagnoses: Fibromyalgia; Spondylosis of lumbar region without myelopathy or radiculopathy    clopidogrel (PLAVIX) 75 MG tablet TAKE 1 TABLET (75 MG TOTAL) BY MOUTH DAILY. Qty: 90 tablet, Refills: 1    cyclobenzaprine (FLEXERIL) 10 MG tablet Take 10 mg by mouth 2 (two) times daily.     diltiazem (CARDIZEM CD) 120 MG 24 hr capsule Take 1 capsule (120 mg total) by mouth daily. NEED OV. Qty: 30 capsule, Refills: 2    DULoxetine (CYMBALTA) 30 MG capsule TAKE 2 CAPSULES (60 MG TOTAL) BY MOUTH EVERY MORNING. Qty: 60 capsule, Refills: 3   Associated Diagnoses: Fibromyalgia; Type 2 diabetes mellitus with peripheral neuropathy (HCC)    ferrous sulfate 325 (65 FE) MG tablet Take 1 tablet (325 mg total) by mouth 3 (three) times daily with meals. Qty: 90 tablet, Refills: 0    fluticasone (FLONASE) 50 MCG/ACT nasal spray PLACE 2 SPRAYS INTO THE NOSE DAILY Qty: 16 g, Refills: 5    glimepiride  (AMARYL) 4 MG tablet Take 4 mg by mouth every morning. Refills: 6    insulin glargine (LANTUS) 100 UNIT/ML injection Inject 8 Units into the skin See admin instructions. In the morning as needed for BGL of 160 or greater    LORazepam (ATIVAN) 1 MG tablet Take 1 mg by mouth every morning.     metFORMIN (GLUCOPHAGE) 500 MG tablet Take 500 mg by mouth 2 (two) times daily. Refills: 6    metoprolol tartrate (LOPRESSOR) 25 MG tablet Take half tablet (12.5 mg total) by mouth daily. Take whole tablet (25 mg total) by mouth daily if SBP > 150. Qty: 30 tablet, Refills: 6    montelukast (SINGULAIR) 10 MG tablet TAKE 1 TABLET (10 MG TOTAL) BY MOUTH DAILY. Qty: 30 tablet, Refills: 5    nitroGLYCERIN (NITROSTAT) 0.4 MG SL tablet Place 1 tablet (0.4 mg total) under the tongue every 5 (  five) minutes as needed for chest pain. Qty: 25 tablet, Refills: 6    omeprazole (PRILOSEC) 40 MG capsule Take 40 mg by mouth every morning.     oxyCODONE (OXY IR/ROXICODONE) 5 MG immediate release tablet Take 1 tablet (5 mg total) by mouth every 8 (eight) hours as needed for severe pain. Qty: 60 tablet, Refills: 0   Associated Diagnoses: Fibromyalgia; Spondylosis of lumbar region without myelopathy or radiculopathy    predniSONE (DELTASONE) 5 MG tablet Take 5 mg by mouth every morning.     promethazine (PHENERGAN) 25 MG tablet Take 25 mg by mouth at bedtime as needed for nausea or vomiting. For nausea and sleep    rosuvastatin (CRESTOR) 20 MG tablet TAKE 1 TABLET (20 MG TOTAL) BY MOUTH DAILY. Qty: 30 tablet, Refills: 2    sertraline (ZOLOFT) 100 MG tablet Take 100 mg by mouth every morning.    VENTOLIN HFA 108 (90 Base) MCG/ACT inhaler INHALE 2 PUFFS INTO THE LUNGS EVERY 6 (SIX) HOURS AS NEEDED FOR WHEEZING OR SHORTNESS OF BREATH. Qty: 18 Inhaler, Refills: 2      STOP taking these medications     fluconazole (DIFLUCAN) 100 MG tablet        Allergies  Allergen Reactions  . Codeine Nausea And Vomiting  .  Erythromycin Nausea And Vomiting  . Hydromorphone Hcl Nausea And Vomiting  . Morphine And Related Nausea And Vomiting  . Nalbuphine Nausea And Vomiting  . Niaspan [Niacin Er] Other (See Comments)    Mouth blisters  . Sulfa Antibiotics Nausea Only and Other (See Comments)    "Tears up stomach"  . Sulfonamide Derivatives Other (See Comments)    Reaction: per patient "tears her stomach up"  . Tetracycline Hives  . Tape Rash    No "plastic" tape," please   Follow-up Information    DETERDING,JAMES L, MD On 11/06/2016.   Specialty:  Nephrology Why:  Appointment is on 11/06/16 at 8:45am Contact information: [place] [address] [phone]        Cleotis Nipper, MD. Schedule an appointment as soon as possible for a visit on 10/26/2016.   Specialty:  Gastroenterology Why:  For hospital follow up (GI doctor)keep your appointment at 3pm Contact information: [address] [place] [address] [phone]        Schedule an appointment as soon as possible for a visit with Collene Gobble., MD.   Specialty:  Pulmonary Disease Why:  For hospital follow up (lung doctor) Keep appointment you have already Contact information: [address] [place] [address] [phone]            The results of significant diagnostics from this hospitalization (including imaging, microbiology, ancillary and laboratory) are listed below for reference.    Significant Diagnostic Studies: Dg Chest 2 View  Result Date: 10/20/2016 CLINICAL DATA:  Cough, pneumonia. EXAM: CHEST  2 VIEW COMPARISON:  Radiographs of October 13, 2016. CT scan of October 14, 2016. FINDINGS: The heart size and mediastinal contours are within normal limits. Status post coronary artery bypass graft. No pneumothorax or pleural effusion is noted. Stable nodular densities are noted in the right upper lobe as described on prior exam. Left lung appears essentially clear at this time. The  visualized skeletal structures are unremarkable. IMPRESSION: Stable right upper lobe nodular densities as noted on prior exam which may be inflammatory or infectious in etiology. Continued CT or radiographic follow-up is recommended to ensure resolution. Electronically Signed   By:  Marijo Conception, M.D.   On: 10/20/2016 11:55   Dg Chest 2 View  Result Date: 10/13/2016 CLINICAL DATA:  Palpitations and shortness of breath. EXAM: CHEST  2 VIEW COMPARISON:  08/31/2016 FINDINGS: Post median sternotomy and CABG. Unchanged heart size and mediastinal contours, heart upper limits of normal. Probable hiatal hernia. Development of multifocal ill-defined opacities within both lungs, right greater than left, some of which appear nodular. Streaky scarring in the left mid lung. Gastric band is faintly visualized. No acute osseous abnormalities are seen. IMPRESSION: Multifocal opacities in both lungs that appear nodule or, right greater than left. These appear new from exam 6 weeks prior. This may be infectious or inflammatory, however CT characterization may be helpful. Electronically Signed   By: Jeb Levering M.D.   On: 10/13/2016 20:26   Ct Chest Wo Contrast  Result Date: 10/14/2016 CLINICAL DATA:  Assess pulmonary nodules noted on chest radiograph. Initial encounter. EXAM: CT CHEST WITHOUT CONTRAST TECHNIQUE: Multidetector CT imaging of the chest was performed following the standard protocol without IV contrast. COMPARISON:  Chest radiograph from 10/13/2016 FINDINGS: Cardiovascular: The patient is status post median sternotomy. Diffuse coronary artery calcifications are seen. The heart remains normal in size. Scattered calcification is noted along the aortic arch and descending thoracic aorta. Mild calcification is seen at the proximal great vessels. Mediastinum/Nodes: The esophagus is filled with fluid, raising question for esophageal dysmotility or gastroesophageal reflux. Scattered calcifications and  heterogeneity are noted at the thyroid gland. No dominant mass is seen. No mediastinal lymphadenopathy is seen. No pericardial effusion is identified. No axillary lymphadenopathy is identified. Lungs/Pleura: Multiple scattered nodules and associated tiny opacities are noted throughout both lungs, more prominent on the right side. Given that these are new from January, these are most likely infectious in nature. No pleural effusion or pneumothorax is seen. No dominant mass is identified. Upper Abdomen: The mildly nodular contour of the liver raises question for mild hepatic cirrhosis. Peripheral calcification is seen at the spleen, possibly reflecting remote injury. The patient is status post cholecystectomy, with clips noted at the gallbladder fossa. The visualized portions of the pancreas and adrenal glands are within normal limits. There is chronic severe bilateral renal atrophy. A gastric band is noted. Musculoskeletal: No acute osseous abnormalities are identified. Mild multilevel vacuum phenomenon is noted along the thoracic spine. The visualized musculature is unremarkable in appearance. IMPRESSION: 1. Scattered nodules and associated tiny opacities within both lungs, more prominent on the right. Given that these are new from January, they are most likely infectious in nature. Would suggest Pulmonary consultation for further evaluation, to assess the underlying infectious etiology. 2. Esophagus filled with fluid, raising question for esophageal dysmotility or gastroesophageal reflux. 3. Diffuse coronary artery calcifications seen. 4. Suggestion of mild hepatic cirrhosis. 5. Chronic severe bilateral renal atrophy. Electronically Signed   By: Garald Balding M.D.   On: 10/14/2016 02:58   US Abdomen Limited Ruq  Result Date: 10/14/2016 CLINICAL DATA:  Right upper quadrant abdominal pain for 1 month. History of renal transplant. Dialysis patient. Status post cholecystectomy. EXAM: US ABDOMEN LIMITED - RIGHT  UPPER QUADRANT COMPARISON:  CT scan 07/28/2014 FINDINGS: Gallbladder: Surgically absent. Common bile duct: Diameter: 4.8 mm Liver: The liver contour is irregular and the left hepatic lobe appears enlarged. Findings suggestive of cirrhosis. No focal hepatic lesions or intrahepatic biliary dilatation. The liver echotexture is mildly coarse. IMPRESSION: Ultrasound findings suggest cirrhotic changes involving the liver. No focal hepatic lesions. Status post cholecystectomy.  No  biliary dilatation. Electronically Signed   By: Marijo Sanes M.D.   On: 10/14/2016 19:02    Microbiology: Recent Results (from the past 240 hour(s))  MRSA PCR Screening     Status: None   Collection Time: 10/14/16  3:25 AM  Result Value Ref Range Status   MRSA by PCR NEGATIVE NEGATIVE Final    Comment:        The GeneXpert MRSA Assay (FDA approved for NASAL specimens only), is one component of a comprehensive MRSA colonization surveillance program. It is not intended to diagnose MRSA infection nor to guide or monitor treatment for MRSA infections.   Culture, blood (routine x 2) Call MD if unable to obtain prior to antibiotics being given     Status: None   Collection Time: 10/14/16  4:02 AM  Result Value Ref Range Status   Specimen Description BLOOD RIGHT HAND  Final   Special Requests   Final    BOTTLES DRAWN AEROBIC AND ANAEROBIC 4CC RED 5CC BLUE   Culture NO GROWTH 5 DAYS  Final   Report Status 10/19/2016 FINAL  Final  Culture, blood (routine x 2) Call MD if unable to obtain prior to antibiotics being given     Status: None   Collection Time: 10/14/16  4:13 AM  Result Value Ref Range Status   Specimen Description BLOOD LEFT ARM  Final   Special Requests   Final    BOTTLES DRAWN AEROBIC AND ANAEROBIC 3CC RED 4CC BLUE   Culture NO GROWTH 5 DAYS  Final   Report Status 10/19/2016 FINAL  Final  Culture, sputum-assessment     Status: None   Collection Time: 10/14/16  2:03 PM  Result Value Ref Range Status    Specimen Description SPUTUM  Final   Special Requests NONE  Final   Sputum evaluation THIS SPECIMEN IS ACCEPTABLE FOR SPUTUM CULTURE  Final   Report Status 10/14/2016 FINAL  Final  Culture, respiratory (NON-Expectorated)     Status: None   Collection Time: 10/14/16  2:03 PM  Result Value Ref Range Status   Specimen Description SPUTUM  Final   Special Requests NONE Reflexed from [id number]  Final   Gram Stain   Final    ABUNDANT WBC PRESENT,BOTH PMN AND MONONUCLEAR ABUNDANT GRAM POSITIVE RODS FEW GRAM NEGATIVE RODS RARE GRAM POSITIVE COCCI IN PAIRS RARE YEAST    Culture Consistent with normal respiratory flora.  Final   Report Status 10/17/2016 FINAL  Final     Labs: Basic Metabolic Panel:  Recent Labs Lab 10/16/16 0231 10/17/16 0222 10/20/16 1156 10/21/16 0651  NA 141 140 142 140  K 3.0* 2.8* 3.2* 3.4*  CL 104 103 107 105  CO2 25 25 22 24   GLUCOSE 138* 155* 123* 104*  BUN 6 <5* 8 8  CREATININE 0.79 0.75 0.86 0.72  CALCIUM 9.2 9.2 9.5 9.0  MG  --  1.5*  --   --    Liver Function Tests:  Recent Labs Lab 10/20/16 1156  AST 24  ALT 13*  ALKPHOS 120  BILITOT 0.5  PROT 7.1  ALBUMIN 3.2*    Recent Labs Lab 10/20/16 1156  LIPASE 18   No results for input(s): AMMONIA in the last 168 hours. CBC:  Recent Labs Lab 10/15/16 0227 10/16/16 0231 10/17/16 0222 10/20/16 1156 10/21/16 0651  WBC 6.0 4.8 3.7* 4.5 3.3*  NEUTROABS  --  3.1  --   --   --   HGB 8.8* 8.7* 8.3* 9.3* 8.2*  HCT 28.5* 28.0* 26.5* 31.1* 27.2*  MCV 85.1 85.6 84.1 85.9 84.5  PLT 316 296 287 293 248   Cardiac Enzymes:  Recent Labs Lab 10/17/16 0222  TROPONINI 0.03*   BNP: BNP (last 3 results) No results for input(s): BNP in the last 8760 hours.  ProBNP (last 3 results)  Recent Labs  02/07/16 1224  PROBNP 139.0*    CBG:  Recent Labs Lab 10/17/16 1231 10/20/16 1242 10/20/16 2214 10/21/16 0746 10/21/16 1131  GLUCAP 153* 99 99 124* 154*       Signed:  Joaquin Knebel  MD.  [facility] 10/21/2016, 2:57 PM

## 2016-10-21 NOTE — Progress Notes (Signed)
Pt discharge instructions and rx reviewed with pt verb understanding. Pt left via wheelchair with belongings in hand in NAD

## 2016-10-22 LAB — HEMOGLOBIN A1C
Hgb A1c MFr Bld: 5.4 % (ref 4.8–5.6)
Mean Plasma Glucose: 108 mg/dL

## 2016-10-29 DIAGNOSIS — D500 Iron deficiency anemia secondary to blood loss (chronic): Secondary | ICD-10-CM | POA: Diagnosis not present

## 2016-10-29 DIAGNOSIS — D132 Benign neoplasm of duodenum: Secondary | ICD-10-CM | POA: Diagnosis not present

## 2016-10-29 DIAGNOSIS — R1031 Right lower quadrant pain: Secondary | ICD-10-CM | POA: Diagnosis not present

## 2016-10-29 DIAGNOSIS — Z800 Family history of malignant neoplasm of digestive organs: Secondary | ICD-10-CM | POA: Diagnosis not present

## 2016-10-30 ENCOUNTER — Encounter: Payer: Self-pay | Admitting: Adult Health

## 2016-10-30 ENCOUNTER — Ambulatory Visit (INDEPENDENT_AMBULATORY_CARE_PROVIDER_SITE_OTHER): Payer: Medicare Other | Admitting: Adult Health

## 2016-10-30 ENCOUNTER — Other Ambulatory Visit: Payer: Self-pay | Admitting: Cardiology

## 2016-10-30 ENCOUNTER — Ambulatory Visit (INDEPENDENT_AMBULATORY_CARE_PROVIDER_SITE_OTHER)
Admission: RE | Admit: 2016-10-30 | Discharge: 2016-10-30 | Disposition: A | Discharge status: Home / Self Care | Payer: Medicare Other | Source: Ambulatory Visit | Attending: Adult Health | Admitting: Adult Health

## 2016-10-30 VITALS — BP 104/68 | HR 80 | Ht 61.0 in | Wt 183.2 lb

## 2016-10-30 DIAGNOSIS — D649 Anemia, unspecified: Secondary | ICD-10-CM

## 2016-10-30 DIAGNOSIS — R918 Other nonspecific abnormal finding of lung field: Secondary | ICD-10-CM | POA: Diagnosis not present

## 2016-10-30 DIAGNOSIS — B3781 Candidal esophagitis: Secondary | ICD-10-CM | POA: Diagnosis not present

## 2016-10-30 DIAGNOSIS — R05 Cough: Secondary | ICD-10-CM | POA: Diagnosis not present

## 2016-10-30 NOTE — Patient Instructions (Addendum)
Continue on current regimen  Chest xray today .  Follow up with GI as planned this month.  Follow up with Dr. Lamonte Sakai  In 6 weeks and As needed   Please contact office for sooner follow up if symptoms do not improve or worsen or seek emergency care

## 2016-10-30 NOTE — Assessment & Plan Note (Signed)
CXR and CT chest with lung nodules - ? Etiology  Clinically appears to be improving  Will recheck CXR today , to see if imprvoging if persists will need CT chest in 3 months to follow   Plan  Patient Instructions  Continue on current regimen  Chest xray today .  Follow up with GI as planned this month.  Follow up with Dr. Lamonte Sakai  In 6 weeks and As needed   Please contact office for sooner follow up if symptoms do not improve or worsen or seek emergency care

## 2016-10-30 NOTE — Assessment & Plan Note (Signed)
Recent GIB , cont w/ GI follow up

## 2016-10-30 NOTE — Assessment & Plan Note (Signed)
Recent notable on EGD , tx w/ clotrimazole.  Con t.fu with GI

## 2016-10-30 NOTE — Progress Notes (Signed)
@Patient  ID: Katherine [name], female    DOB: 03/08/1956, 61 y.o.   MRN: [id number]  Chief Complaint  Patient presents with  . Follow-up    Asthma     Referring provider: Mauricia Area, MD  HPI: 53 -year-old female with history of obstructive sleep apnea and obesity hypoventilation syndrome, status post lap band surgery, Asthma ,  chronic cough, upper ~by GERD and aspiration. Previous renal transplant on chronic steroids and Imuran  10/30/2016 Follow up : Post hospital follow up -Anemia /GIB /Lung nodules.  Patient presents for a post hospital follow-up. Patient is been admitted twice over the last few weeks. Initially for symptomatic anemia. She underwent an endoscopy that showed Candida esophagitis , gastritis and duodenal polyp. [name] Kitchen sHe did receive 1 unit of packed red blood cells. Chest x-ray showed multifocal opacites right greater than left, subsequent CT chest showed scattered nodules within both lungs, right greater than left.. Patient was seen by infectious disease. She is on chronic immunosuppression with previous renal transplant.. Blood cultures were negative. Sputum cx showed normal respiratory flora. She was readmitted for n/v last week. She was unable to take diflucan d/t drug interaction. Started on clotrimazole. He was seen by general surgery with evaluation of her lap band and fluid removed from pouch. His discharge she is feeling better. She does have some intermittent nausea. She denies any vomiting. She does have a follow-up with GI in the next 2 weeks. Breathing is at baseline remains on Symbicort.    Allergies  Allergen Reactions  . Codeine Nausea And Vomiting  . Erythromycin Nausea And Vomiting  . Hydromorphone Hcl Nausea And Vomiting  . Morphine And Related Nausea And Vomiting  . Nalbuphine Nausea And Vomiting  . Niaspan [Niacin Er] Other (See Comments)    Mouth blisters  . Sulfa Antibiotics Nausea Only and Other (See Comments)    "Tears up stomach"  .  Sulfonamide Derivatives Other (See Comments)    Reaction: per patient "tears her stomach up"  . Tetracycline Hives  . Tape Rash    No "plastic" tape," please    Immunization History  Administered Date(s) Administered  . Influenza Split 05/11/2012, 05/17/2016  . Influenza Whole 06/17/2009, 06/17/2010, 04/18/2011  . Influenza,inj,Quad PF,36+ Mos 08/24/2013, 05/20/2015    Past Medical History:  Diagnosis Date  . Anxiety   . Atrial fibrillation ([place])   . Bilateral carotid artery stenosis    Carotid duplex 10/4740: 5-95% LICA, 63-87% RICA, >56% RECA, f/u 1 yr suggested  . CAD (coronary artery disease) of bypass graft 5/01; 3/'02, 8/'03, 10/'04; 1/15   PCI x 5 to SVG-D1   . CAD in native artery 07/1993    3 Vessel Disease (LAD-D1 & RCA) -- CABG  . CAD S/P percutaneous coronary angioplasty    PCI to SVG-D1 insertion/native D1 x 4 = '01 -(S660 BMS 2.5 x 9 - insertion into D1; '02 - distal overlap ACS Pixel 2.5 x 8  BMS; '03 distal/native ISR/Thrombosis - Pixel 2.5 x 13; '04 - ISR-  Taxus 2.5 x 20 (covered all);; 1/15 - mid SVG-D1 (50% distal ISR) - Promus P 2.75 x 20 -- 2.8 mm  . COPD mixed type ([place])    Followed by Dr. Lamonte Sakai "pulmonologist said no COPD"  . Depression with anxiety   . Diabetes mellitus type 2 in obese ([place])   . Dyslipidemia, goal LDL below 70    08/2012: TC 137, TG 200, HDL 32!, LDL 45; on statin (followed by Dr.Deterding)  .  Enlarged heart    "told one time"  . ESRD (end stage renal disease) ([place]) 1991   s/p Cadaveric Renal Transplant [facility] - Dr. Jimmy Footman)   . Family history of adverse reaction to anesthesia    mom's bp dropped during/after anesthesia  . Fibromyalgia   . GERD (gastroesophageal reflux disease)   . Glomerulonephritis, chronic, rapidly progressive 1  . 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 07/1993   Rescue PTCA of RCA -- referred for CABG.  . H/O: GI bleed   . Hypertension associated with diabetes ([place])   . MRSA  (methicillin resistant staph aureus) culture positive   . Obesity    s/p Lap Band 04/2004- Port Replacement 10/09 & 2/10 for infection; Dr. Excell Seltzer.  . OSA (obstructive sleep apnea)    no longer on CPAP or home O2  . PAD (peripheral artery disease) ([place]) 08/2013   LEA Dopplers to be read by Dr. Fletcher Anon  . PAF (paroxysmal atrial fibrillation) ([place])    Noted on CardioNet Monitor 11/5-18 - rate ~112  . Recurrent boils    Bilateral Groin  . Rheumatoid arthritis ([employer])    Per Patient Report; associated with OA  . 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 Servando Snare: LIMA-LAD, SVG-bifurcatingD1, SVG-rPDA  . S/p cadaver renal transplant [place]  . Stroke [facility]) 2012   "right eye stroke- half blind now"  . Unstable angina ([place]) 5/01; 3/'02, 8/'03, 10/'04; 1/15   x 5 occurences since Inf-Post STEMI in 1994    Tobacco History: History  Smoking Status  . Former Smoker  . Packs/day: 1.00  . Years: 30.00  . Types: Cigarettes  . Quit date: 08/17/2002  Smokeless Tobacco  . Never Used   Counseling given: Not Answered   Outpatient Encounter Prescriptions as of 10/30/2016  Medication Sig  . acetaminophen (TYLENOL) 500 MG tablet Take 1 tablet (500 mg total) by mouth every 6 (six) hours as needed for moderate pain or headache.  . azaTHIOprine (IMURAN) 50 MG tablet Take 125 mg by mouth every evening.   . budesonide-formoterol (SYMBICORT) 160-4.5 MCG/ACT inhaler Inhale 2 puffs into the lungs 2 (two) times daily.  . calcitRIOL (ROCALTROL) 0.25 MCG capsule Take 0.25 mcg by mouth every morning.   . carbamazepine (TEGRETOL) 200 MG tablet Take 1 tablet (200 mg total) by mouth 2 (two) times daily.  . clopidogrel (PLAVIX) 75 MG tablet TAKE 1 TABLET (75 MG TOTAL) BY MOUTH DAILY.  . clotrimazole (MYCELEX) 10 MG troche Take 1 tablet (10 mg total) by mouth 5 (five) times daily.  . cyclobenzaprine (FLEXERIL) 10 MG tablet Take 10 mg by mouth 2 (two) times daily.   [name] Kitchen diltiazem (CARDIZEM CD) 120 MG 24 hr capsule Take 1 capsule  (120 mg total) by mouth daily. NEED OV. (Patient taking differently: Take 120 mg by mouth daily. )  . DULoxetine (CYMBALTA) 30 MG capsule TAKE 2 CAPSULES (60 MG TOTAL) BY MOUTH EVERY MORNING.  . ferrous sulfate 325 (65 FE) MG tablet Take 1 tablet (325 mg total) by mouth 3 (three) times daily with meals.  . fluticasone (FLONASE) 50 MCG/ACT nasal spray PLACE 2 SPRAYS INTO THE NOSE DAILY (Patient taking differently: Place 2 sprays into each nostril two times a day)  . glimepiride (AMARYL) 4 MG tablet Take 4 mg by mouth every morning.  . insulin glargine (LANTUS) 100 UNIT/ML injection Inject 8 Units into the skin See admin instructions. In the morning as needed for BGL of 160 or  greater  . LORazepam (ATIVAN) 1 MG tablet Take 1 mg by mouth every morning.   . metFORMIN (GLUCOPHAGE) 500 MG tablet Take 500 mg by mouth 2 (two) times daily.  . metoprolol tartrate (LOPRESSOR) 25 MG tablet Take half tablet (12.5 mg total) by mouth daily. Take whole tablet (25 mg total) by mouth daily if SBP > 150. (Patient taking differently: Take 12.5-25 mg by mouth See admin instructions. Take half tablet (12.5 mg total) by mouth daily. Take whole tablet (25 mg total) by mouth daily if SBP > 150.)  . montelukast (SINGULAIR) 10 MG tablet TAKE 1 TABLET (10 MG TOTAL) BY MOUTH DAILY.  . nitroGLYCERIN (NITROSTAT) 0.4 MG SL tablet Place 1 tablet (0.4 mg total) under the tongue every 5 (five) minutes as needed for chest pain.  [name] Kitchen omeprazole (PRILOSEC) 40 MG capsule Take 40 mg by mouth every morning.   . ondansetron (ZOFRAN ODT) 4 MG disintegrating tablet Take 1 tablet (4 mg total) by mouth every 8 (eight) hours as needed for nausea or vomiting.  [name] Kitchen oxyCODONE (OXY IR/ROXICODONE) 5 MG immediate release tablet Take 1 tablet (5 mg total) by mouth every 8 (eight) hours as needed for severe pain.  . predniSONE (DELTASONE) 5 MG tablet Take 5 mg by mouth every morning.   . promethazine (PHENERGAN) 25 MG tablet Take 25 mg by mouth at bedtime as  needed for nausea or vomiting. For nausea and sleep  . rosuvastatin (CRESTOR) 20 MG tablet TAKE 1 TABLET (20 MG TOTAL) BY MOUTH DAILY.  [name] Kitchen sertraline (ZOLOFT) 100 MG tablet Take 100 mg by mouth every morning.  . VENTOLIN HFA 108 (90 Base) MCG/ACT inhaler INHALE 2 PUFFS INTO THE LUNGS EVERY 6 (SIX) HOURS AS NEEDED FOR WHEEZING OR SHORTNESS OF BREATH.   No facility-administered encounter medications on file as of 10/30/2016.      Review of Systems  Constitutional:   No  weight loss, night sweats,  Fevers, chills, + fatigue, or  lassitude.  HEENT:   No headaches,  Difficulty swallowing,  Tooth/dental problems, or  Sore throat,                No sneezing, itching, ear ache,  +nasal congestion, post nasal drip,   CV:  No chest pain,  Orthopnea, PND, swelling in lower extremities, anasarca, dizziness, palpitations, syncope.   GI  No heartburn, indigestion, abdominal pain, nausea, vomiting, diarrhea, change in bowel habits, loss of appetite, bloody stools.   Resp:    No chest wall deformity  Skin: no rash or lesions.  GU: no dysuria, change in color of urine, no urgency or frequency.  No flank pain, no hematuria   MS:  No joint pain or swelling.  No decreased range of motion.  No back pain.    Physical Exam  BP 104/68 (BP Location: Right Arm, Cuff Size: Normal)   Pulse 80   Ht 5\' 1"  (1.549 m)   Wt 183 lb 3.2 oz (83.1 kg)   SpO2 100%   BMI 34.62 kg/m   GEN: A/Ox3; pleasant , NAD , obese    HEENT:  [place]/AT,  EACs-clear, TMs-wnl, NOSE-clear, THROAT-clear, no lesions, no postnasal drip or exudate noted.   NECK:  Supple w/ fair ROM; no JVD; normal carotid impulses w/o bruits; no thyromegaly or nodules palpated; no lymphadenopathy.    RESP  Clear  P & A; w/o, wheezes/ rales/ or rhonchi. no accessory muscle use, no dullness to percussion  CARD:  RRR, no m/r/g, no peripheral  edema, pulses intact, no cyanosis or clubbing.  GI:   Soft & nt; nml bowel sounds; no organomegaly or masses  detected.   Musco: Warm bil, no deformities or joint swelling noted.   Neuro: alert, no focal deficits noted.    Skin: Warm, no lesions or rashes    Lab Results:   BMET  BNP No results found for: BNP  ProBNPImaging: Dg Chest 2 View  Result Date: 10/30/2016 CLINICAL DATA:  Follow-up pneumonia.  Cough. EXAM: CHEST  2 VIEW COMPARISON:  Chest radiograph 10/20/2016 FINDINGS: Normal cardiac and mediastinal contours. Patient status post median sternotomy and CABG procedure. Similar appearing nodular densities within the right upper lobe. No pleural effusion or pneumothorax. Thoracic spine degenerative changes. IMPRESSION: Similar appearing nodular densities within the right upper lobe, potentially infectious or inflammatory. Continued radiographic follow-up is recommended to ensure resolution. Electronically Signed   By: Lovey Newcomer M.D.   On: 10/30/2016 13:02   Dg Chest 2 View  Result Date: 10/20/2016 CLINICAL DATA:  Cough, pneumonia. EXAM: CHEST  2 VIEW COMPARISON:  Radiographs of October 13, 2016. CT scan of October 14, 2016. FINDINGS: The heart size and mediastinal contours are within normal limits. Status post coronary artery bypass graft. No pneumothorax or pleural effusion is noted. Stable nodular densities are noted in the right upper lobe as described on prior exam. Left lung appears essentially clear at this time. The visualized skeletal structures are unremarkable. IMPRESSION: Stable right upper lobe nodular densities as noted on prior exam which may be inflammatory or infectious in etiology. Continued CT or radiographic follow-up is recommended to ensure resolution. Electronically Signed   By: Marijo Conception, M.D.   On: 10/20/2016 11:55   Dg Chest 2 View  Result Date: 10/13/2016 CLINICAL DATA:  Palpitations and shortness of breath. EXAM: CHEST  2 VIEW COMPARISON:  08/31/2016 FINDINGS: Post median sternotomy and CABG. Unchanged heart size and mediastinal contours, heart upper  limits of normal. Probable hiatal hernia. Development of multifocal ill-defined opacities within both lungs, right greater than left, some of which appear nodular. Streaky scarring in the left mid lung. Gastric band is faintly visualized. No acute osseous abnormalities are seen. IMPRESSION: Multifocal opacities in both lungs that appear nodule or, right greater than left. These appear new from exam 6 weeks prior. This may be infectious or inflammatory, however CT characterization may be helpful. Electronically Signed   By: Jeb Levering M.D.   On: 10/13/2016 20:26   Ct Chest Wo Contrast  Result Date: 10/14/2016 CLINICAL DATA:  Assess pulmonary nodules noted on chest radiograph. Initial encounter. EXAM: CT CHEST WITHOUT CONTRAST TECHNIQUE: Multidetector CT imaging of the chest was performed following the standard protocol without IV contrast. COMPARISON:  Chest radiograph from 10/13/2016 FINDINGS: Cardiovascular: The patient is status post median sternotomy. Diffuse coronary artery calcifications are seen. The heart remains normal in size. Scattered calcification is noted along the aortic arch and descending thoracic aorta. Mild calcification is seen at the proximal great vessels. Mediastinum/Nodes: The esophagus is filled with fluid, raising question for esophageal dysmotility or gastroesophageal reflux. Scattered calcifications and heterogeneity are noted at the thyroid gland. No dominant mass is seen. No mediastinal lymphadenopathy is seen. No pericardial effusion is identified. No axillary lymphadenopathy is identified. Lungs/Pleura: Multiple scattered nodules and associated tiny opacities are noted throughout both lungs, more prominent on the right side. Given that these are new from January, these are most likely infectious in nature. No pleural effusion or pneumothorax is seen. No dominant  mass is identified. Upper Abdomen: The mildly nodular contour of the liver raises question for mild hepatic  cirrhosis. Peripheral calcification is seen at the spleen, possibly reflecting remote injury. The patient is status post cholecystectomy, with clips noted at the gallbladder fossa. The visualized portions of the pancreas and adrenal glands are within normal limits. There is chronic severe bilateral renal atrophy. A gastric band is noted. Musculoskeletal: No acute osseous abnormalities are identified. Mild multilevel vacuum phenomenon is noted along the thoracic spine. The visualized musculature is unremarkable in appearance. IMPRESSION: 1. Scattered nodules and associated tiny opacities within both lungs, more prominent on the right. Given that these are new from January, they are most likely infectious in nature. Would suggest Pulmonary consultation for further evaluation, to assess the underlying infectious etiology. 2. Esophagus filled with fluid, raising question for esophageal dysmotility or gastroesophageal reflux. 3. Diffuse coronary artery calcifications seen. 4. Suggestion of mild hepatic cirrhosis. 5. Chronic severe bilateral renal atrophy. Electronically Signed   By: Garald Balding M.D.   On: 10/14/2016 02:58   US Abdomen Limited Ruq  Result Date: 10/14/2016 CLINICAL DATA:  Right upper quadrant abdominal pain for 1 month. History of renal transplant. Dialysis patient. Status post cholecystectomy. EXAM: US ABDOMEN LIMITED - RIGHT UPPER QUADRANT COMPARISON:  CT scan 07/28/2014 FINDINGS: Gallbladder: Surgically absent. Common bile duct: Diameter: 4.8 mm Liver: The liver contour is irregular and the left hepatic lobe appears enlarged. Findings suggestive of cirrhosis. No focal hepatic lesions or intrahepatic biliary dilatation. The liver echotexture is mildly coarse. IMPRESSION: Ultrasound findings suggest cirrhotic changes involving the liver. No focal hepatic lesions. Status post cholecystectomy.  No biliary dilatation. Electronically Signed   By: Marijo Sanes M.D.   On: 10/14/2016 19:02      Assessment & Plan:   Lung nodules CXR and CT chest with lung nodules - ? Etiology  Clinically appears to be improving  Will recheck CXR today , to see if imprvoging if persists will need CT chest in 3 months to follow   Plan  Patient Instructions  Continue on current regimen  Chest xray today .  Follow up with GI as planned this month.  Follow up with Dr. Lamonte Sakai  In 6 weeks and As needed   Please contact office for sooner follow up if symptoms do not improve or worsen or seek emergency care      Symptomatic anemia Recent GIB , cont w/ GI follow up   Esophageal candidiasis ([place]) Recent notable on EGD , tx w/ clotrimazole.  Con t.fu with GI      Rexene Edison, NP 10/30/2016

## 2016-11-02 ENCOUNTER — Other Ambulatory Visit: Payer: Self-pay | Admitting: Physician Assistant

## 2016-11-05 ENCOUNTER — Telehealth: Payer: Self-pay | Admitting: Adult Health

## 2016-11-05 NOTE — Telephone Encounter (Signed)
Katherine Needles, NP sent to Katherine [name], CMA Cc: Katherine Gobble, MD        RUL nodules appear stable. Left lung has cleared  Cont w/ ov recs  Follow up as planned with CXR with Katherine [name] .  May need repeat CT chest , will decide on return visit after CXR  Please contact office for sooner follow up if symptoms do not improve or worsen or seek emergency care    Spoke with pt and notified of results per Katherine [name]. Pt verbalized understanding and denied any questions.

## 2016-11-05 NOTE — Progress Notes (Signed)
See phone note 11/05/16

## 2016-11-16 ENCOUNTER — Encounter: Payer: Medicare Other | Attending: Registered Nurse | Admitting: Registered Nurse

## 2016-11-16 ENCOUNTER — Encounter: Payer: Self-pay | Admitting: Registered Nurse

## 2016-11-16 VITALS — BP 113/76 | HR 81

## 2016-11-16 DIAGNOSIS — I252 Old myocardial infarction: Secondary | ICD-10-CM | POA: Diagnosis not present

## 2016-11-16 DIAGNOSIS — Z940 Kidney transplant status: Secondary | ICD-10-CM | POA: Diagnosis not present

## 2016-11-16 DIAGNOSIS — I120 Hypertensive chronic kidney disease with stage 5 chronic kidney disease or end stage renal disease: Secondary | ICD-10-CM | POA: Diagnosis not present

## 2016-11-16 DIAGNOSIS — N186 End stage renal disease: Secondary | ICD-10-CM | POA: Diagnosis not present

## 2016-11-16 DIAGNOSIS — Z8249 Family history of ischemic heart disease and other diseases of the circulatory system: Secondary | ICD-10-CM | POA: Insufficient documentation

## 2016-11-16 DIAGNOSIS — G894 Chronic pain syndrome: Secondary | ICD-10-CM | POA: Diagnosis not present

## 2016-11-16 DIAGNOSIS — J449 Chronic obstructive pulmonary disease, unspecified: Secondary | ICD-10-CM | POA: Diagnosis not present

## 2016-11-16 DIAGNOSIS — Z87891 Personal history of nicotine dependence: Secondary | ICD-10-CM | POA: Diagnosis not present

## 2016-11-16 DIAGNOSIS — I2510 Atherosclerotic heart disease of native coronary artery without angina pectoris: Secondary | ICD-10-CM | POA: Insufficient documentation

## 2016-11-16 DIAGNOSIS — Z79899 Other long term (current) drug therapy: Secondary | ICD-10-CM

## 2016-11-16 DIAGNOSIS — Z7952 Long term (current) use of systemic steroids: Secondary | ICD-10-CM | POA: Diagnosis not present

## 2016-11-16 DIAGNOSIS — M7581 Other shoulder lesions, right shoulder: Secondary | ICD-10-CM | POA: Diagnosis not present

## 2016-11-16 DIAGNOSIS — I480 Paroxysmal atrial fibrillation: Secondary | ICD-10-CM | POA: Insufficient documentation

## 2016-11-16 DIAGNOSIS — Z955 Presence of coronary angioplasty implant and graft: Secondary | ICD-10-CM | POA: Insufficient documentation

## 2016-11-16 DIAGNOSIS — M25551 Pain in right hip: Secondary | ICD-10-CM

## 2016-11-16 DIAGNOSIS — Z951 Presence of aortocoronary bypass graft: Secondary | ICD-10-CM | POA: Insufficient documentation

## 2016-11-16 DIAGNOSIS — M47816 Spondylosis without myelopathy or radiculopathy, lumbar region: Secondary | ICD-10-CM

## 2016-11-16 DIAGNOSIS — E1122 Type 2 diabetes mellitus with diabetic chronic kidney disease: Secondary | ICD-10-CM | POA: Insufficient documentation

## 2016-11-16 DIAGNOSIS — K219 Gastro-esophageal reflux disease without esophagitis: Secondary | ICD-10-CM | POA: Insufficient documentation

## 2016-11-16 DIAGNOSIS — E669 Obesity, unspecified: Secondary | ICD-10-CM | POA: Insufficient documentation

## 2016-11-16 DIAGNOSIS — D631 Anemia in chronic kidney disease: Secondary | ICD-10-CM | POA: Diagnosis not present

## 2016-11-16 DIAGNOSIS — Z9884 Bariatric surgery status: Secondary | ICD-10-CM | POA: Diagnosis not present

## 2016-11-16 DIAGNOSIS — M069 Rheumatoid arthritis, unspecified: Secondary | ICD-10-CM | POA: Insufficient documentation

## 2016-11-16 DIAGNOSIS — M797 Fibromyalgia: Secondary | ICD-10-CM | POA: Insufficient documentation

## 2016-11-16 DIAGNOSIS — Z8673 Personal history of transient ischemic attack (TIA), and cerebral infarction without residual deficits: Secondary | ICD-10-CM | POA: Diagnosis not present

## 2016-11-16 DIAGNOSIS — M25552 Pain in left hip: Secondary | ICD-10-CM | POA: Diagnosis not present

## 2016-11-16 DIAGNOSIS — I6523 Occlusion and stenosis of bilateral carotid arteries: Secondary | ICD-10-CM | POA: Insufficient documentation

## 2016-11-16 DIAGNOSIS — Z992 Dependence on renal dialysis: Secondary | ICD-10-CM | POA: Insufficient documentation

## 2016-11-16 DIAGNOSIS — Z5181 Encounter for therapeutic drug level monitoring: Secondary | ICD-10-CM

## 2016-11-16 DIAGNOSIS — G4733 Obstructive sleep apnea (adult) (pediatric): Secondary | ICD-10-CM | POA: Diagnosis not present

## 2016-11-16 DIAGNOSIS — Z800 Family history of malignant neoplasm of digestive organs: Secondary | ICD-10-CM | POA: Insufficient documentation

## 2016-11-16 DIAGNOSIS — Z8614 Personal history of Methicillin resistant Staphylococcus aureus infection: Secondary | ICD-10-CM | POA: Insufficient documentation

## 2016-11-16 DIAGNOSIS — Z808 Family history of malignant neoplasm of other organs or systems: Secondary | ICD-10-CM | POA: Insufficient documentation

## 2016-11-16 DIAGNOSIS — E785 Hyperlipidemia, unspecified: Secondary | ICD-10-CM | POA: Diagnosis not present

## 2016-11-16 DIAGNOSIS — Z9851 Tubal ligation status: Secondary | ICD-10-CM | POA: Insufficient documentation

## 2016-11-16 DIAGNOSIS — Z9049 Acquired absence of other specified parts of digestive tract: Secondary | ICD-10-CM | POA: Insufficient documentation

## 2016-11-16 MED ORDER — OXYCODONE HCL 5 MG PO TABS: 5.0000 mg | ORAL_TABLET | Freq: Three times a day (TID) | ORAL | 0 refills | Status: DC | PRN

## 2016-11-16 NOTE — Progress Notes (Signed)
Subjective:    Patient ID: Katherine [name], female    DOB: September 20, 1955, 61 y.o.   MRN: [id number]  HPI: [name] is a 61 year old female who returns for follow up for chronic pain and medication refill. She states her pain is located in her right shoulder, bilateral hips and lower back. She rates her pain 8. Her current exercise regime is walking.  She was admitted to [facility] on 10/13/2016 and discharged 03/032018 for Upper GI Bleed/ Anemia and Lung Nodules and Candid Esophagitis.  Admitted to [facility] on 10/20/2016 and discharged 10/21/2016 for abdominal pain.  She has been diagnosed with Duodenal Adenoma, awaiting surgerty date she states.   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 Emotional support given.  NCCSR was reviewed Dr. Naaman Plummer prescription was not listed on the [place]. [facility] was called and spoke with the pharmacist Ebony Hail, she stated the Oxycodone prescription from 05/19/2016 was Dr. Naaman Plummer prescription they listed the prescription under Dr. Layne Benton name, it was their mistake.    Pain Inventory Average Pain 8 Pain Right Now 8 My pain is aching  In the last 24 hours, has pain interfered with the following? General activity 6 Relation with others 7 Enjoyment of life 7 What TIME of day is your pain at its worst? all Sleep (in general) Fair  Pain is worse with: walking, standing and some activites Pain improves with: rest, heat/ice, medication and injections Relief from Meds: 9  Mobility walk without assistance ability to climb steps?  yes do you drive?  yes  Function not employed: date last employed 1989  Neuro/Psych numbness depression  Prior Studies Any changes since last visit?  no  Physicians involved in your care Any changes since last visit?  no   Family History  Problem Relation Age of Onset  . Cancer Mother     liver  . Heart  disease Father   . Cancer Father     colon  . Arrhythmia Brother     Atrial Fibrillation  . Arrhythmia Paternal Aunt     Atrial Fibrillation   Social History   Social History  . Marital status: Married    Spouse name: N/A  . Number of children: N/A  . Years of education: N/A   Social History Main Topics  . Smoking status: Former Smoker    Packs/day: 1.00    Years: 30.00    Types: Cigarettes    Quit date: 08/17/2002  . Smokeless tobacco: Never Used  . Alcohol use No  . Drug use: No  . Sexual activity: Not on file   Other Topics Concern  . 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 Prior to his diagnosis of her husband, she actually had adopted a 74-year-old child who she knows caring for as well. With all the surgeries, they have been quite financially troubled. Thanks the help of her community and church, they have been able to stay "alfoat."     She is a former smoker who quit in 2004 after a 30-pack-year history.   She is active chasing a 80-year-old child, does not do routine exercise. She's been quite depressed with the condition of her husband, and admits to eating comfort herself.   She does  not drink alcohol.   Past Surgical History:  Procedure Laterality Date  . CARDIAC CATHETERIZATION  5/'01, 3/'02, 8/'03, 10/'04; 1/'15   08/22/2013: LAD & RCA 100%; LIMA-LAD & SVG-rPDA patent; Cx-- OM1 60%, OM2 ostial ~50%; SVG-D1 - 80% mid, 50% distal ISR --PCI  . CARDIAC CATHETERIZATION  5/'01, 3/'02, 8/'03, 10/'04; 1/'15   08/22/2013: LAD & RCA 100%; LIMA-LAD & SVG-rPDA patent; Cx-- OM1 60%, OM2 ostial ~50%; SVG-D1 - 80% mid, 50% distal ISR --PCI  . CATHETER REMOVAL    . CHOLECYSTECTOMY N/A 10/29/2014   Procedure: LAPAROSCOPIC CHOLECYSTECTOMY WITH INTRAOPERATIVE CHOLANGIOGRAM;  Surgeon: Excell Seltzer, MD;  Location: [facility];  Service: General;   Laterality: N/A;  . CORONARY ANGIOPLASTY  1994   x5  . CORONARY ARTERY BYPASS GRAFT  1995   LIMA-LAD, SVG-RPDA, SVG-D1  . ESOPHAGOGASTRODUODENOSCOPY N/A 10/15/2016   Procedure: ESOPHAGOGASTRODUODENOSCOPY (EGD);  Surgeon: Wilford Corner, MD;  Location: [facility] ENDOSCOPY;  Service: Endoscopy;  Laterality: N/A;  . INCISE AND DRAIN ABCESS    . KIDNEY TRANSPLANT  1991  . LAPAROSCOPIC GASTRIC BANDING  04/2004; 10/'09, 2/'10   Port Replacement x 2  . LEFT HEART CATHETERIZATION WITH CORONARY/GRAFT ANGIOGRAM N/A 08/23/2013   Procedure: LEFT HEART CATHETERIZATION WITH Beatrix Fetters;  Surgeon: Wellington Hampshire, MD;  Location: [place] CATH LAB;  Service: Cardiovascular;  Laterality: N/A;  . Lower Extremity Arterial Dopplers  08/2013   ABI: R 0.96, L 1.04  . MULTIPLE TOOTH EXTRACTIONS  age 32  . PERCUTANEOUS CORONARY STENT INTERVENTION (PCI-S)  5/'01, 3/'02, 8/'03, 10/'04;   '01 - S660 BMS 2.5 x 9 - dSVG-D1 into D1; '02- post-stent stenosis - 2.5 x 8 Pixel BMS; '8\03: ISR/Thrombosis into native D1 - AngioJet, 2.5 x 13 Pixel; '04 - ISR 95% - covered stented area with Taxus DES 2.5 mm x 20 (2.88)  . PERCUTANEOUS CORONARY STENT INTERVENTION (PCI-S)  08/22/2013   mid SVG-D1 80%; distal stent ~50% ISR; Promus Prermier DES 2.75 mm xc 20 mm (2.8 mm)  . PERCUTANEOUS CORONARY STENT INTERVENTION (PCI-S)  08/23/2013   Procedure: PERCUTANEOUS CORONARY STENT INTERVENTION (PCI-S);  Surgeon: Wellington Hampshire, MD;  Location: [facility] CATH LAB;  Service: Cardiovascular;;  . PORT-A-CATH REMOVAL     kidney  . TRANSTHORACIC ECHOCARDIOGRAM  02/2013   NL LV Size, EF 55-60%; MAC with Mild MR.  . TUBAL LIGATION    . wrist fistula repair Left    dialysis for one year   Past Medical History:  Diagnosis Date  . Anxiety   . Atrial fibrillation ([place])   . Bilateral carotid artery stenosis    Carotid duplex 12/7015: 7-93% LICA, 90-30% RICA, >09% RECA, f/u 1 yr suggested  . CAD (coronary artery disease) of bypass graft 5/01; 3/'02, 8/'03,  10/'04; 1/15   PCI x 5 to SVG-D1   . CAD in native artery 07/1993    3 Vessel Disease (LAD-D1 & RCA) -- CABG  . CAD S/P percutaneous coronary angioplasty    PCI to SVG-D1 insertion/native D1 x 4 = '01 -(S660 BMS 2.5 x 9 - insertion into D1; '02 - distal overlap ACS Pixel 2.5 x 8  BMS; '03 distal/native ISR/Thrombosis - Pixel 2.5 x 13; '04 - ISR-  Taxus 2.5 x 20 (covered all);; 1/15 - mid SVG-D1 (50% distal ISR) - Promus P 2.75 x 20 -- 2.8 mm  . COPD mixed type ([place])    Followed by Dr. Lamonte Sakai "pulmonologist said no COPD"  . Depression with anxiety   . Diabetes mellitus type 2  in obese ([place])   . Dyslipidemia, goal LDL below 70    08/2012: TC 137, TG 200, HDL 32!, LDL 45; on statin (followed by Dr.Deterding)  . Enlarged heart    "told one time"  . ESRD (end stage renal disease) ([place]) 1991   s/p Cadaveric Renal Transplant [facility] - Dr. Jimmy Footman)   . Family history of adverse reaction to anesthesia    mom's bp dropped during/after anesthesia  . Fibromyalgia   . GERD (gastroesophageal reflux disease)   . Glomerulonephritis, chronic, rapidly progressive 56  . 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 07/1993   Rescue PTCA of RCA -- referred for CABG.  . H/O: GI bleed   . Hypertension associated with diabetes ([place])   . MRSA (methicillin resistant staph aureus) culture positive   . Obesity    s/p Lap Band 04/2004- Port Replacement 10/09 & 2/10 for infection; Dr. Excell Seltzer.  . OSA (obstructive sleep apnea)    no longer on CPAP or home O2  . PAD (peripheral artery disease) ([place]) 08/2013   LEA Dopplers to be read by Dr. Fletcher Anon  . PAF (paroxysmal atrial fibrillation) ([place])    Noted on CardioNet Monitor 11/5-18 - rate ~112  . Recurrent boils    Bilateral Groin  . Rheumatoid arthritis ([place])    Per Patient Report; associated with OA  . 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 Servando Snare: LIMA-LAD, SVG-bifurcatingD1, SVG-rPDA  . S/p cadaver renal transplant [place]  . Stroke [facility])  2012   "right eye stroke- half blind now"  . Unstable angina ([place]) 5/01; 3/'02, 8/'03, 10/'04; 1/15   x 5 occurences since Inf-Post STEMI in 1994   There were no vitals taken for this visit.  Opioid Risk Score:   Fall Risk Score:  `1  Depression screen PHQ 2/9  Depression screen [facility] 2/9 05/19/2016 03/30/2016  Decreased Interest 3 3  Down, Depressed, Hopeless 3 3  PHQ - 2 Score 6 6  Altered sleeping - 3  Tired, decreased energy - 3  Change in appetite - 2  Feeling bad or failure about yourself  - 3  Trouble concentrating - 2  Moving slowly or fidgety/restless - 0  Suicidal thoughts - 0  PHQ-9 Score - 19  Some recent data might be hidden    Review of Systems  Constitutional: Positive for diaphoresis and unexpected weight change.  HENT: Negative.   Eyes: Negative.   Respiratory: Positive for cough.   Cardiovascular: Negative.   Gastrointestinal: Positive for abdominal pain.  Endocrine: Negative.   Genitourinary: Negative.   Musculoskeletal: Negative.   Skin: Negative.   Allergic/Immunologic: Negative.   Neurological: Negative.   Hematological: Negative.   Psychiatric/Behavioral: Negative.   All other systems reviewed and are negative.      Objective:   Physical Exam  Constitutional: She is oriented to person, place, and time. She appears well-developed and well-nourished.  HENT:  Head: Normocephalic and atraumatic.  Neck: Normal range of motion. Neck supple.  Cardiovascular: Normal rate and regular rhythm.   Pulmonary/Chest: Effort normal and breath sounds normal.  Musculoskeletal:  Normal Muscle Bulk and Muscle Testing Reveals: Right AC Joint Tenderness Lumbar Paraspinal Tenderness: L-3-L-5 Lower Extremities: Full ROM and Muscle Strength 5/5 Arises from Table with ease Narrow Based Gait  Neurological: She is alert and oriented to person, place, and time.  Skin: Skin is warm and dry.  Psychiatric: She has a normal mood and affect.  Nursing note and vitals  reviewed.         Assessment & Plan:  1. Fibromyalgia: Continue with HEP as tolerated. Continue Tegretol and Cymbalta 2. Rheumatoid Arthritis: Continue to Monitor 3. Osteoarthritis--polyarticular: Continue with current medication regime and continue to monitor. 4. ESRD with hx of renal transplant on chronic steroids: Nephrology Following 5. Right rotator cuff tendonitis/like degenerative/inflammatory changes in the right shoulder also. Continue HEP and Continue to Monitor  20 minutes of face to face patient care time was spent during this visit. All questions were encouraged and answered.  F/U in 1 month

## 2016-11-17 ENCOUNTER — Other Ambulatory Visit: Payer: Self-pay | Admitting: Cardiology

## 2016-11-17 DIAGNOSIS — J029 Acute pharyngitis, unspecified: Secondary | ICD-10-CM | POA: Diagnosis not present

## 2016-11-17 NOTE — Telephone Encounter (Signed)
Rx request sent to pharmacy.  

## 2016-11-18 ENCOUNTER — Telehealth: Payer: Self-pay | Admitting: Registered Nurse

## 2016-11-18 NOTE — Telephone Encounter (Signed)
On 11/16/2016 the [place] was reviewed no conflict was seen on the [place] with multiple prescribers.  If there were any discrepancies this would have been reported to her physician.

## 2016-11-19 ENCOUNTER — Other Ambulatory Visit: Payer: Self-pay | Admitting: Cardiology

## 2016-11-19 LAB — TOXASSURE SELECT,+ANTIDEPR,UR

## 2016-11-19 NOTE — Telephone Encounter (Signed)
refill 

## 2016-11-26 DIAGNOSIS — N390 Urinary tract infection, site not specified: Secondary | ICD-10-CM | POA: Diagnosis not present

## 2016-11-30 ENCOUNTER — Telehealth: Payer: Self-pay | Admitting: *Deleted

## 2016-11-30 NOTE — Telephone Encounter (Signed)
Urine drug screen for this encounter is consistent for prescribed medication. It reports oxycodone as one of her medications and was absent but per [place] she had not had an Rx since 05/19/16 and so it was appropriately absent from urine.

## 2016-12-01 DIAGNOSIS — R102 Pelvic and perineal pain: Secondary | ICD-10-CM | POA: Diagnosis not present

## 2016-12-01 DIAGNOSIS — Z124 Encounter for screening for malignant neoplasm of cervix: Secondary | ICD-10-CM | POA: Diagnosis not present

## 2016-12-01 DIAGNOSIS — Z780 Asymptomatic menopausal state: Secondary | ICD-10-CM | POA: Diagnosis not present

## 2016-12-01 DIAGNOSIS — Z6837 Body mass index (BMI) 37.0-37.9, adult: Secondary | ICD-10-CM | POA: Diagnosis not present

## 2016-12-01 DIAGNOSIS — Z01419 Encounter for gynecological examination (general) (routine) without abnormal findings: Secondary | ICD-10-CM | POA: Diagnosis not present

## 2016-12-03 ENCOUNTER — Encounter: Payer: Self-pay | Admitting: Internal Medicine

## 2016-12-03 ENCOUNTER — Other Ambulatory Visit: Payer: Self-pay | Admitting: Cardiology

## 2016-12-03 ENCOUNTER — Encounter: Payer: Self-pay | Admitting: Physician Assistant

## 2016-12-03 DIAGNOSIS — D126 Benign neoplasm of colon, unspecified: Secondary | ICD-10-CM | POA: Diagnosis not present

## 2016-12-03 DIAGNOSIS — D509 Iron deficiency anemia, unspecified: Secondary | ICD-10-CM | POA: Diagnosis not present

## 2016-12-03 LAB — HM COLONOSCOPY

## 2016-12-03 NOTE — Telephone Encounter (Signed)
NEEDS TO SCHEDULE APPT

## 2016-12-04 ENCOUNTER — Other Ambulatory Visit: Payer: Self-pay

## 2016-12-04 DIAGNOSIS — N390 Urinary tract infection, site not specified: Secondary | ICD-10-CM | POA: Diagnosis not present

## 2016-12-04 DIAGNOSIS — I2510 Atherosclerotic heart disease of native coronary artery without angina pectoris: Secondary | ICD-10-CM | POA: Diagnosis not present

## 2016-12-04 DIAGNOSIS — M47816 Spondylosis without myelopathy or radiculopathy, lumbar region: Secondary | ICD-10-CM

## 2016-12-04 DIAGNOSIS — Z6838 Body mass index (BMI) 38.0-38.9, adult: Secondary | ICD-10-CM | POA: Diagnosis not present

## 2016-12-04 DIAGNOSIS — I739 Peripheral vascular disease, unspecified: Secondary | ICD-10-CM | POA: Diagnosis not present

## 2016-12-04 DIAGNOSIS — N182 Chronic kidney disease, stage 2 (mild): Secondary | ICD-10-CM | POA: Diagnosis not present

## 2016-12-04 DIAGNOSIS — M797 Fibromyalgia: Secondary | ICD-10-CM

## 2016-12-04 DIAGNOSIS — N189 Chronic kidney disease, unspecified: Secondary | ICD-10-CM | POA: Diagnosis not present

## 2016-12-04 DIAGNOSIS — E118 Type 2 diabetes mellitus with unspecified complications: Secondary | ICD-10-CM | POA: Diagnosis not present

## 2016-12-04 DIAGNOSIS — I129 Hypertensive chronic kidney disease with stage 1 through stage 4 chronic kidney disease, or unspecified chronic kidney disease: Secondary | ICD-10-CM | POA: Diagnosis not present

## 2016-12-04 DIAGNOSIS — E785 Hyperlipidemia, unspecified: Secondary | ICD-10-CM | POA: Diagnosis not present

## 2016-12-04 DIAGNOSIS — I639 Cerebral infarction, unspecified: Secondary | ICD-10-CM | POA: Diagnosis not present

## 2016-12-04 DIAGNOSIS — I4891 Unspecified atrial fibrillation: Secondary | ICD-10-CM | POA: Diagnosis not present

## 2016-12-04 DIAGNOSIS — Z940 Kidney transplant status: Secondary | ICD-10-CM | POA: Diagnosis not present

## 2016-12-04 DIAGNOSIS — D631 Anemia in chronic kidney disease: Secondary | ICD-10-CM | POA: Diagnosis not present

## 2016-12-04 DIAGNOSIS — N2581 Secondary hyperparathyroidism of renal origin: Secondary | ICD-10-CM | POA: Diagnosis not present

## 2016-12-04 MED ORDER — CARBAMAZEPINE 200 MG PO TABS: 200.0000 mg | ORAL_TABLET | Freq: Two times a day (BID) | ORAL | 0 refills | Status: DC

## 2016-12-07 ENCOUNTER — Ambulatory Visit: Payer: Medicare Other | Admitting: Emergency Medicine

## 2016-12-07 ENCOUNTER — Other Ambulatory Visit: Payer: Self-pay

## 2016-12-07 DIAGNOSIS — M797 Fibromyalgia: Secondary | ICD-10-CM

## 2016-12-07 DIAGNOSIS — M47816 Spondylosis without myelopathy or radiculopathy, lumbar region: Secondary | ICD-10-CM

## 2016-12-07 MED ORDER — CARBAMAZEPINE 200 MG PO TABS: 200.0000 mg | ORAL_TABLET | Freq: Two times a day (BID) | ORAL | 0 refills | Status: DC

## 2016-12-09 DIAGNOSIS — D126 Benign neoplasm of colon, unspecified: Secondary | ICD-10-CM | POA: Diagnosis not present

## 2016-12-13 ENCOUNTER — Other Ambulatory Visit: Payer: Self-pay | Admitting: Cardiology

## 2016-12-15 DIAGNOSIS — Z800 Family history of malignant neoplasm of digestive organs: Secondary | ICD-10-CM | POA: Diagnosis not present

## 2016-12-15 DIAGNOSIS — R143 Flatulence: Secondary | ICD-10-CM | POA: Diagnosis not present

## 2016-12-15 DIAGNOSIS — D132 Benign neoplasm of duodenum: Secondary | ICD-10-CM | POA: Diagnosis not present

## 2016-12-15 DIAGNOSIS — D500 Iron deficiency anemia secondary to blood loss (chronic): Secondary | ICD-10-CM | POA: Diagnosis not present

## 2016-12-17 ENCOUNTER — Ambulatory Visit: Payer: Medicare Other | Admitting: Emergency Medicine

## 2016-12-19 ENCOUNTER — Other Ambulatory Visit: Payer: Self-pay | Admitting: Cardiology

## 2016-12-22 ENCOUNTER — Encounter: Payer: Self-pay | Admitting: Physical Medicine & Rehabilitation

## 2016-12-22 ENCOUNTER — Encounter: Payer: Medicare Other | Attending: Physical Medicine & Rehabilitation | Admitting: Physical Medicine & Rehabilitation

## 2016-12-22 VITALS — BP 148/86 | HR 69 | Resp 14

## 2016-12-22 DIAGNOSIS — M797 Fibromyalgia: Secondary | ICD-10-CM | POA: Diagnosis not present

## 2016-12-22 DIAGNOSIS — E1142 Type 2 diabetes mellitus with diabetic polyneuropathy: Secondary | ICD-10-CM

## 2016-12-22 DIAGNOSIS — M069 Rheumatoid arthritis, unspecified: Secondary | ICD-10-CM | POA: Insufficient documentation

## 2016-12-22 DIAGNOSIS — Z8673 Personal history of transient ischemic attack (TIA), and cerebral infarction without residual deficits: Secondary | ICD-10-CM | POA: Diagnosis not present

## 2016-12-22 DIAGNOSIS — I480 Paroxysmal atrial fibrillation: Secondary | ICD-10-CM | POA: Diagnosis not present

## 2016-12-22 DIAGNOSIS — Z9851 Tubal ligation status: Secondary | ICD-10-CM | POA: Insufficient documentation

## 2016-12-22 DIAGNOSIS — G4733 Obstructive sleep apnea (adult) (pediatric): Secondary | ICD-10-CM | POA: Diagnosis not present

## 2016-12-22 DIAGNOSIS — Z9884 Bariatric surgery status: Secondary | ICD-10-CM | POA: Diagnosis not present

## 2016-12-22 DIAGNOSIS — K219 Gastro-esophageal reflux disease without esophagitis: Secondary | ICD-10-CM | POA: Insufficient documentation

## 2016-12-22 DIAGNOSIS — J449 Chronic obstructive pulmonary disease, unspecified: Secondary | ICD-10-CM | POA: Insufficient documentation

## 2016-12-22 DIAGNOSIS — E1122 Type 2 diabetes mellitus with diabetic chronic kidney disease: Secondary | ICD-10-CM | POA: Insufficient documentation

## 2016-12-22 DIAGNOSIS — N186 End stage renal disease: Secondary | ICD-10-CM | POA: Diagnosis not present

## 2016-12-22 DIAGNOSIS — M47816 Spondylosis without myelopathy or radiculopathy, lumbar region: Secondary | ICD-10-CM

## 2016-12-22 DIAGNOSIS — Z940 Kidney transplant status: Secondary | ICD-10-CM | POA: Diagnosis not present

## 2016-12-22 DIAGNOSIS — I252 Old myocardial infarction: Secondary | ICD-10-CM | POA: Insufficient documentation

## 2016-12-22 DIAGNOSIS — Z87891 Personal history of nicotine dependence: Secondary | ICD-10-CM | POA: Insufficient documentation

## 2016-12-22 DIAGNOSIS — Z7952 Long term (current) use of systemic steroids: Secondary | ICD-10-CM | POA: Insufficient documentation

## 2016-12-22 DIAGNOSIS — E669 Obesity, unspecified: Secondary | ICD-10-CM | POA: Insufficient documentation

## 2016-12-22 DIAGNOSIS — Z8249 Family history of ischemic heart disease and other diseases of the circulatory system: Secondary | ICD-10-CM | POA: Insufficient documentation

## 2016-12-22 DIAGNOSIS — Z992 Dependence on renal dialysis: Secondary | ICD-10-CM | POA: Insufficient documentation

## 2016-12-22 DIAGNOSIS — M7581 Other shoulder lesions, right shoulder: Secondary | ICD-10-CM | POA: Diagnosis not present

## 2016-12-22 DIAGNOSIS — I6523 Occlusion and stenosis of bilateral carotid arteries: Secondary | ICD-10-CM | POA: Insufficient documentation

## 2016-12-22 DIAGNOSIS — Z8614 Personal history of Methicillin resistant Staphylococcus aureus infection: Secondary | ICD-10-CM | POA: Insufficient documentation

## 2016-12-22 DIAGNOSIS — Z951 Presence of aortocoronary bypass graft: Secondary | ICD-10-CM | POA: Diagnosis not present

## 2016-12-22 DIAGNOSIS — Z955 Presence of coronary angioplasty implant and graft: Secondary | ICD-10-CM | POA: Insufficient documentation

## 2016-12-22 DIAGNOSIS — E785 Hyperlipidemia, unspecified: Secondary | ICD-10-CM | POA: Diagnosis not present

## 2016-12-22 DIAGNOSIS — I2510 Atherosclerotic heart disease of native coronary artery without angina pectoris: Secondary | ICD-10-CM | POA: Insufficient documentation

## 2016-12-22 DIAGNOSIS — Z9049 Acquired absence of other specified parts of digestive tract: Secondary | ICD-10-CM | POA: Insufficient documentation

## 2016-12-22 DIAGNOSIS — I120 Hypertensive chronic kidney disease with stage 5 chronic kidney disease or end stage renal disease: Secondary | ICD-10-CM | POA: Insufficient documentation

## 2016-12-22 DIAGNOSIS — Z808 Family history of malignant neoplasm of other organs or systems: Secondary | ICD-10-CM | POA: Insufficient documentation

## 2016-12-22 DIAGNOSIS — Z800 Family history of malignant neoplasm of digestive organs: Secondary | ICD-10-CM | POA: Insufficient documentation

## 2016-12-22 DIAGNOSIS — D631 Anemia in chronic kidney disease: Secondary | ICD-10-CM | POA: Diagnosis not present

## 2016-12-22 MED ORDER — CYCLOBENZAPRINE HCL 10 MG PO TABS: 10.0000 mg | ORAL_TABLET | Freq: Three times a day (TID) | ORAL | 2 refills | Status: DC | PRN

## 2016-12-22 MED ORDER — OXYCODONE HCL 5 MG PO TABS: 5.0000 mg | ORAL_TABLET | Freq: Three times a day (TID) | ORAL | 0 refills | Status: DC | PRN

## 2016-12-22 NOTE — Patient Instructions (Signed)
YOU NEED TO CONSIDER SAFETY AND BALANCE EVERY DAY!!

## 2016-12-22 NOTE — Progress Notes (Signed)
Subjective:    Patient ID: Katherine [name], female    DOB: 1956/02/21, 61 y.o.   MRN: [id number]  HPI   Katherine [name] is here in follow up of her chronic pain. She was in the hospital for n/v abdominal pain. Her hgb was low also but no source of bleeding was found. A duodenal adenoma was found on endo which was felt as possible contributing to the blood loss. Hgb was 8.2 on 10/21/16. Some other nodules were discovered in her lungs. She is being referred to [facility] for second opinion. Dr. [name] is her primary GI.  She sees Dr. Lamonte Sakai next week to follow up chest nodules.   Her pain levels are fairly stable. She continues to have pain in her right shoulder, hips, and low back. She tripped over an area rug in March going into [facility] which really flared her low back symptoms which have since decreased. She used more of her oxycodone to help with the pain. She is using flexeril also bid.        Pain Inventory Average Pain 4 Pain Right Now 4 My pain is constant, sharp, burning, tingling and aching  In the last 24 hours, has pain interfered with the following? General activity 4 Relation with others 4 Enjoyment of life 6 What TIME of day is your pain at its worst? morning, evening Sleep (in general) Fair  Pain is worse with: walking, bending and some activites Pain improves with: heat/ice and medication Relief from Meds: 8  Mobility walk without assistance how many minutes can you walk? 15 ability to climb steps?  yes do you drive?  yes Do you have any goals in this area?  yes  Function not employed: date last employed 1988 disabled: date disabled 1989 I need assistance with the following:  household duties  Neuro/Psych weakness spasms depression anxiety  Prior Studies Any changes since last visit?  no  Physicians involved in your care Any changes since last visit?  no   Family History  Problem Relation Age of Onset  . Cancer Mother     liver  . Heart disease  Father   . Cancer Father     colon  . Arrhythmia Brother     Atrial Fibrillation  . Arrhythmia Paternal Aunt     Atrial Fibrillation   Social History   Social History  . Marital status: Married    Spouse name: N/A  . Number of children: N/A  . Years of education: N/A   Social History Main Topics  . Smoking status: Former Smoker    Packs/day: 1.00    Years: 30.00    Types: Cigarettes    Quit date: 08/17/2002  . Smokeless tobacco: Never Used  . Alcohol use No  . Drug use: No  . Sexual activity: Not Asked   Other Topics Concern  . None   Social History Narrative   She is currently married, and the caregiver of her husband who is recovering from surgery for tongue cancer now diagnosed with lung cancer. Prior to his diagnosis of her husband, she actually had adopted a 24-year-old child who she knows caring for as well. With all the surgeries, they have been quite financially troubled. Thanks the help of her community and church, they have been able to stay "alfoat."     She is a former smoker who quit in 2004 after a 30-pack-year history.   She is active chasing a 53-year-old child, does not do routine exercise. She's  been quite depressed with the condition of her husband, and admits to eating comfort herself.   She does not drink alcohol.   Past Surgical History:  Procedure Laterality Date  . CARDIAC CATHETERIZATION  5/'01, 3/'02, 8/'03, 10/'04; 1/'15   08/22/2013: LAD & RCA 100%; LIMA-LAD & SVG-rPDA patent; Cx-- OM1 60%, OM2 ostial ~50%; SVG-D1 - 80% mid, 50% distal ISR --PCI  . CARDIAC CATHETERIZATION  5/'01, 3/'02, 8/'03, 10/'04; 1/'15   08/22/2013: LAD & RCA 100%; LIMA-LAD & SVG-rPDA patent; Cx-- OM1 60%, OM2 ostial ~50%; SVG-D1 - 80% mid, 50% distal ISR --PCI  . CATHETER REMOVAL    . CHOLECYSTECTOMY N/A 10/29/2014   Procedure: LAPAROSCOPIC CHOLECYSTECTOMY WITH INTRAOPERATIVE CHOLANGIOGRAM;  Surgeon: Excell Seltzer, MD;  Location: [facility];  Service: General;  Laterality: N/A;  .  CORONARY ANGIOPLASTY  1994   x5  . CORONARY ARTERY BYPASS GRAFT  1995   LIMA-LAD, SVG-RPDA, SVG-D1  . ESOPHAGOGASTRODUODENOSCOPY N/A 10/15/2016   Procedure: ESOPHAGOGASTRODUODENOSCOPY (EGD);  Surgeon: Wilford Corner, MD;  Location: [place] ENDOSCOPY;  Service: Endoscopy;  Laterality: N/A;  . INCISE AND DRAIN ABCESS    . KIDNEY TRANSPLANT  1991  . LAPAROSCOPIC GASTRIC BANDING  04/2004; 10/'09, 2/'10   Port Replacement x 2  . LEFT HEART CATHETERIZATION WITH CORONARY/GRAFT ANGIOGRAM N/A 08/23/2013   Procedure: LEFT HEART CATHETERIZATION WITH Beatrix Fetters;  Surgeon: Wellington Hampshire, MD;  Location: [place] CATH LAB;  Service: Cardiovascular;  Laterality: N/A;  . Lower Extremity Arterial Dopplers  08/2013   ABI: R 0.96, L 1.04  . MULTIPLE TOOTH EXTRACTIONS  age 69  . PERCUTANEOUS CORONARY STENT INTERVENTION (PCI-S)  5/'01, 3/'02, 8/'03, 10/'04;   '01 - S660 BMS 2.5 x 9 - dSVG-D1 into D1; '02- post-stent stenosis - 2.5 x 8 Pixel BMS; '8\03: ISR/Thrombosis into native D1 - AngioJet, 2.5 x 13 Pixel; '04 - ISR 95% - covered stented area with Taxus DES 2.5 mm x 20 (2.88)  . PERCUTANEOUS CORONARY STENT INTERVENTION (PCI-S)  08/22/2013   mid SVG-D1 80%; distal stent ~50% ISR; Promus Prermier DES 2.75 mm xc 20 mm (2.8 mm)  . PERCUTANEOUS CORONARY STENT INTERVENTION (PCI-S)  08/23/2013   Procedure: PERCUTANEOUS CORONARY STENT INTERVENTION (PCI-S);  Surgeon: Wellington Hampshire, MD;  Location: [facility] CATH LAB;  Service: Cardiovascular;;  . PORT-A-CATH REMOVAL     kidney  . TRANSTHORACIC ECHOCARDIOGRAM  02/2013   NL LV Size, EF 55-60%; MAC with Mild MR.  . TUBAL LIGATION    . wrist fistula repair Left    dialysis for one year   Past Medical History:  Diagnosis Date  . Anxiety   . Atrial fibrillation ([place])   . Bilateral carotid artery stenosis    Carotid duplex 12/6431: 2-95% LICA, 18-84% RICA, >16% RECA, f/u 1 yr suggested  . CAD (coronary artery disease) of bypass graft 5/01; 3/'02, 8/'03, 10/'04; 1/15   PCI x  5 to SVG-D1   . CAD in native artery 07/1993    3 Vessel Disease (LAD-D1 & RCA) -- CABG  . CAD S/P percutaneous coronary angioplasty    PCI to SVG-D1 insertion/native D1 x 4 = '01 -(S660 BMS 2.5 x 9 - insertion into D1; '02 - distal overlap ACS Pixel 2.5 x 8  BMS; '03 distal/native ISR/Thrombosis - Pixel 2.5 x 13; '04 - ISR-  Taxus 2.5 x 20 (covered all);; 1/15 - mid SVG-D1 (50% distal ISR) - Promus P 2.75 x 20 -- 2.8 mm  . COPD mixed type ([place])    Followed  by Dr. Lamonte Sakai "pulmonologist said no COPD"  . Depression with anxiety   . Diabetes mellitus type 2 in obese ([place])   . Dyslipidemia, goal LDL below 70    08/2012: TC 137, TG 200, HDL 32!, LDL 45; on statin (followed by Dr.Deterding)  . Enlarged heart    "told one time"  . ESRD (end stage renal disease) ([place]) 1991   s/p Cadaveric Renal Transplant [facility] - Dr. Jimmy Footman)   . Family history of adverse reaction to anesthesia    mom's bp dropped during/after anesthesia  . Fibromyalgia   . GERD (gastroesophageal reflux disease)   . Glomerulonephritis, chronic, rapidly progressive 89  . 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 07/1993   Rescue PTCA of RCA -- referred for CABG.  . H/O: GI bleed   . Hypertension associated with diabetes ([place])   . MRSA (methicillin resistant staph aureus) culture positive   . Obesity    s/p Lap Band 04/2004- Port Replacement 10/09 & 2/10 for infection; Dr. Excell Seltzer.  . OSA (obstructive sleep apnea)    no longer on CPAP or home O2  . PAD (peripheral artery disease) ([place]) 08/2013   LEA Dopplers to be read by Dr. Fletcher Anon  . PAF (paroxysmal atrial fibrillation) ([place])    Noted on CardioNet Monitor 11/5-18 - rate ~112  . Recurrent boils    Bilateral Groin  . Rheumatoid arthritis ([place])    Per Patient Report; associated with OA  . 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 Servando Snare: LIMA-LAD, SVG-bifurcatingD1, SVG-rPDA  . S/p cadaver renal transplant [place]  . Stroke [facility]) 2012   "right eye  stroke- half blind now"  . Unstable angina ([place]) 5/01; 3/'02, 8/'03, 10/'04; 1/15   x 5 occurences since Inf-Post STEMI in 1994   BP (!) 148/86 (BP Location: Right Wrist, Patient Position: Sitting, Cuff Size: Normal)   Pulse 69   Resp 14   SpO2 97%   Opioid Risk Score:   Fall Risk Score:  `1  Depression screen PHQ 2/9  Depression screen [facility] 2/9 05/19/2016 03/30/2016  Decreased Interest 3 3  Down, Depressed, Hopeless 3 3  PHQ - 2 Score 6 6  Altered sleeping - 3  Tired, decreased energy - 3  Change in appetite - 2  Feeling bad or failure about yourself  - 3  Trouble concentrating - 2  Moving slowly or fidgety/restless - 0  Suicidal thoughts - 0  PHQ-9 Score - 19  Some recent data might be hidden    Review of Systems  Constitutional: Positive for unexpected weight change.  HENT: Negative.   Eyes: Negative.   Respiratory: Positive for cough, shortness of breath and wheezing.        Respiratory infection   Gastrointestinal: Positive for abdominal pain.  Endocrine:       High blood sugar   Genitourinary: Positive for difficulty urinating.  Musculoskeletal: Positive for arthralgias, back pain, myalgias, neck pain and neck stiffness.       Spasms  Skin: Negative.   Allergic/Immunologic: Negative.   Neurological: Positive for weakness.  Hematological: Bruises/bleeds easily.  Psychiatric/Behavioral: Positive for dysphoric mood. The patient is nervous/anxious.   All other systems reviewed and are negative.      Objective:   Physical Exam General: Alert and oriented x 3, No apparent distress. She is overweight HEENT:PERRL, EOMI Neck:supple Heart:RRR Chest:CTA B Abdomen:Soft, non-tender, non-distended, bowel sounds positive. Extremities:pulses2+ Skin:Clean and intact without signs of breakdown. Has dry  skin in places. Numerous bruises bilateral legs, with large hematoma/knot along right medial calf---no warmth or frank calf tenderness. No erythema.  Neuro:CN  normal. Motor 5/5 Musculoskeletal:right shoulder with improved pain with IR/ER/ABD. Impingement sign not as substantial. .   She has no gross rheumatoid deformities in the hands or feet. Knees exhibited mild effusion but minimal crepitus.  Joint line pain was provoked with both knees knee medially and latetrally. BALANCE IS FUNCTIONAL Psych:Pt's affect is appropriate. Pt is cooperative       Assessment & Plan:  1. Fibromyalgia 2. Rheumatoid Arthritis by history 3. Osteoarthritis--polyarticular 4. ESRD with hx of renal transplant on chronic steroids 5. Right rotator cuff tendonitis/like degenerative/inflammatory changes in the right shoulder also.  6. Hx o falls, recent fall with multiple hematomas/bruises   Plan: 1. Continue with safety awareness. She is at increased risk for falling. Discussed this at length today 2. oxycodone 85m q8 prn, #60--We will continue the opioid monitoring program, this consists of regular clinic visits, examinations, urine drug screen, pill counts [place] well [place].   3. Continuecymbalta to 676mdaily, decrease zoloft to 10033mEventually weanzoloft to offmoving forward given high risk of serotonin syndrome.  4. Continue tegretol but reduce to 200m3mD which has been helpful 6. Reviewed legs/ bruises--still present but controlled 7. GI and Pulmonary work up as above. She looks good today and signs would point away from a malignant process. 8. Aquatic activities were encouraged.     15 minutes of face to face patient care time were spent during this visit. All questions were encouraged and answered. Return visit in one month with NP

## 2016-12-28 ENCOUNTER — Ambulatory Visit (INDEPENDENT_AMBULATORY_CARE_PROVIDER_SITE_OTHER): Payer: Medicare Other | Admitting: Adult Health

## 2016-12-28 ENCOUNTER — Ambulatory Visit (INDEPENDENT_AMBULATORY_CARE_PROVIDER_SITE_OTHER)
Admission: RE | Admit: 2016-12-28 | Discharge: 2016-12-28 | Disposition: A | Discharge status: Home / Self Care | Payer: Medicare Other | Source: Ambulatory Visit | Attending: Adult Health | Admitting: Adult Health

## 2016-12-28 ENCOUNTER — Encounter: Payer: Self-pay | Admitting: Adult Health

## 2016-12-28 VITALS — BP 132/82 | HR 85 | Ht 61.0 in | Wt 195.8 lb

## 2016-12-28 DIAGNOSIS — J309 Allergic rhinitis, unspecified: Secondary | ICD-10-CM | POA: Diagnosis not present

## 2016-12-28 DIAGNOSIS — R918 Other nonspecific abnormal finding of lung field: Secondary | ICD-10-CM

## 2016-12-28 DIAGNOSIS — J45909 Unspecified asthma, uncomplicated: Secondary | ICD-10-CM | POA: Diagnosis not present

## 2016-12-28 NOTE — Assessment & Plan Note (Signed)
Mild flare   Plan  Change claritin to zyrtec .  Add saline rinses

## 2016-12-28 NOTE — Assessment & Plan Note (Signed)
Improved control  Control for triggers - AR /GERD   Plan  Patient Instructions  Add Saline nasal rinses 2-3 twice daily  .  Add Zyrtec 10mg  At bedtime (in place of claritin )    Continue Symbicort, Flonase and Singulair .  follow up Dr. Lamonte Sakai  In 2-3 months and As needed   Please contact office for sooner follow up if symptoms do not improve or worsen or seek emergency care

## 2016-12-28 NOTE — Patient Instructions (Addendum)
Add Saline nasal rinses 2-3 twice daily  .  Add Zyrtec 10mg  At bedtime (in place of claritin )    Continue Symbicort, Flonase and Singulair .  follow up Dr. Lamonte Sakai  In 2-3 months and As needed   Please contact office for sooner follow up if symptoms do not improve or worsen or seek emergency care

## 2016-12-28 NOTE — Assessment & Plan Note (Signed)
Lung nodules  ? From chronic aspiraiton  N/v/ have resolved with lap band release.  CXR today shows resolution of lung nodules .

## 2016-12-28 NOTE — Progress Notes (Signed)
@Patient  ID: Katherine [name], female    DOB: 04-09-1956, 61 y.o.   MRN: [id number]  Chief Complaint  Patient presents with  . Follow-up    Asthma and lung nodules    Referring provider: Mauricia Area, MD  HPI: 61 -year-old female with history of obstructive sleep apnea and obesity hypoventilation syndrome, status post lap band surgery, Asthma , chronic cough, upper ~by GERD and aspiration. Previous renal transplant on chronic steroids and Imuran  12/28/2016 Follow up : Asthma ,Chronic cough , Lung nodules Patient returns for a two-month follow-up. Patient says overall her breathing is Patient remains on Symbicort twice daily, Singulair and Flonase daily.   Patient was admitted in March for symptomatic anemia. She underwent endoscopy that showed Candida esophagitis, gastritis, and duodenal polyp. She did receive 1 unit of packed red blood cells. Seen by GI Dr Philbert Riser , underwent upper Endo and Colonscopy . Found polypoid mass in the duodenal . Her Lap band was loosened due to vomiting . She has been referred to GI in [facility] for mass removal . Path was tubular adenoma. OV is pending .  Says she feels so much better since lap band was loosened. She has had no vomiting . Says she has so much more energy . Has gained wt. Admits she is going to work on eating better.    CT chest showed scattered nodules within both lungs, right greater than left. Patient was seen by infectious disease. She is on chronic immunosuppression with previous renal transplant. Blood cultures were negative. Sputum culture showed normal respiratory flora. She is on chronic steroids with prednisone 5 mg daily  Chest x-ray today shows resolution of nodular opacities in RUL .     Allergies  Allergen Reactions  . Codeine Nausea And Vomiting  . Erythromycin Nausea And Vomiting  . Hydromorphone Hcl Nausea And Vomiting  . Morphine And Related Nausea And Vomiting  . Nalbuphine Nausea And Vomiting  . Niaspan [Niacin  Er] Other (See Comments)    Mouth blisters  . Sulfa Antibiotics Nausea Only and Other (See Comments)    "Tears up stomach"  . Sulfonamide Derivatives Other (See Comments)    Reaction: per patient "tears her stomach up"  . Tetracycline Hives  . Tape Rash    No "plastic" tape," please    Immunization History  Administered Date(s) Administered  . Influenza Split 05/11/2012, 05/17/2016  . Influenza Whole 06/17/2009, 06/17/2010, 04/18/2011  . Influenza,inj,Quad PF,36+ Mos 08/24/2013, 05/20/2015    Past Medical History:  Diagnosis Date  . Anxiety   . Atrial fibrillation ([place])   . Bilateral carotid artery stenosis    Carotid duplex 02/6194: 0-93% LICA, 26-71% RICA, >24% RECA, f/u 1 yr suggested  . CAD (coronary artery disease) of bypass graft 5/01; 3/'02, 8/'03, 10/'04; 1/15   PCI x 5 to SVG-D1   . CAD in native artery 07/1993    3 Vessel Disease (LAD-D1 & RCA) -- CABG  . CAD S/P percutaneous coronary angioplasty    PCI to SVG-D1 insertion/native D1 x 4 = '01 -(S660 BMS 2.5 x 9 - insertion into D1; '02 - distal overlap ACS Pixel 2.5 x 8  BMS; '03 distal/native ISR/Thrombosis - Pixel 2.5 x 13; '04 - ISR-  Taxus 2.5 x 20 (covered all);; 1/15 - mid SVG-D1 (50% distal ISR) - Promus P 2.75 x 20 -- 2.8 mm  . COPD mixed type ([place])    Followed by Dr. Lamonte Sakai "pulmonologist said no COPD"  . Depression with anxiety   .  Diabetes mellitus type 2 in obese ([place])   . Dyslipidemia, goal LDL below 70    08/2012: TC 137, TG 200, HDL 32!, LDL 45; on statin (followed by Dr.Deterding)  . Enlarged heart    "told one time"  . ESRD (end stage renal disease) ([place]) 1991   s/p Cadaveric Renal Transplant [facility] - Dr. Jimmy Footman)   . Family history of adverse reaction to anesthesia    mom's bp dropped during/after anesthesia  . Fibromyalgia   . GERD (gastroesophageal reflux disease)   . Glomerulonephritis, chronic, rapidly progressive 40  . 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  07/1993   Rescue PTCA of RCA -- referred for CABG.  . H/O: GI bleed   . Hypertension associated with diabetes ([place])   . MRSA (methicillin resistant staph aureus) culture positive   . Obesity    s/p Lap Band 04/2004- Port Replacement 10/09 & 2/10 for infection; Dr. Excell Seltzer.  . OSA (obstructive sleep apnea)    no longer on CPAP or home O2  . PAD (peripheral artery disease) ([place]) 08/2013   LEA Dopplers to be read by Dr. Fletcher Anon  . PAF (paroxysmal atrial fibrillation) ([place])    Noted on CardioNet Monitor 11/5-18 - rate ~112  . Recurrent boils    Bilateral Groin  . Rheumatoid arthritis ([place])    Per Patient Report; associated with OA  . 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 Servando Snare: LIMA-LAD, SVG-bifurcatingD1, SVG-rPDA  . S/p cadaver renal transplant [place]  . Stroke [facility]) 2012   "right eye stroke- half blind now"  . Unstable angina ([place]) 5/01; 3/'02, 8/'03, 10/'04; 1/15   x 5 occurences since Inf-Post STEMI in 1994    Tobacco History: History  Smoking Status  . Former Smoker  . Packs/day: 1.00  . Years: 30.00  . Types: Cigarettes  . Quit date: 08/17/2002  Smokeless Tobacco  . Never Used   Counseling given: Not Answered   Outpatient Encounter Prescriptions as of 12/28/2016  Medication Sig  . acetaminophen (TYLENOL) 500 MG tablet Take 1 tablet (500 mg total) by mouth every 6 (six) hours as needed for moderate pain or headache.  . azaTHIOprine (IMURAN) 50 MG tablet Take 125 mg by mouth every evening.   . budesonide-formoterol (SYMBICORT) 160-4.5 MCG/ACT inhaler Inhale 2 puffs into the lungs 2 (two) times daily.  . calcitRIOL (ROCALTROL) 0.25 MCG capsule Take 0.25 mcg by mouth every morning.   . carbamazepine (TEGRETOL) 200 MG tablet Take 1 tablet (200 mg total) by mouth 2 (two) times daily.  . clopidogrel (PLAVIX) 75 MG tablet Take 1 tablet (75 mg total) by mouth daily. Please schedule appointment for refills. 2nd attempt.  . clotrimazole (MYCELEX) 10 MG troche Take 1 tablet (10 mg  total) by mouth 5 (five) times daily.  . cyclobenzaprine (FLEXERIL) 10 MG tablet Take 1 tablet (10 mg total) by mouth 3 (three) times daily as needed for muscle spasms.  [name] Kitchen diltiazem (CARDIZEM CD) 120 MG 24 hr capsule TAKE 1 CAPSULE (120 MG TOTAL) BY MOUTH DAILY. NEED OV.  . DULoxetine (CYMBALTA) 30 MG capsule TAKE 2 CAPSULES (60 MG TOTAL) BY MOUTH EVERY MORNING.  . ferrous sulfate 325 (65 FE) MG tablet Take 1 tablet (325 mg total) by mouth 3 (three) times daily with meals.  . fluticasone (FLONASE) 50 MCG/ACT nasal spray PLACE 2 SPRAYS INTO THE NOSE DAILY (Patient taking differently: Place 2 sprays into each nostril two times a day)  . glimepiride (  AMARYL) 4 MG tablet Take 4 mg by mouth every morning.  . insulin glargine (LANTUS) 100 UNIT/ML injection Inject 8 Units into the skin See admin instructions. In the morning as needed for BGL of 160 or greater  . LORazepam (ATIVAN) 1 MG tablet Take 1 mg by mouth every morning.   . metFORMIN (GLUCOPHAGE) 500 MG tablet Take 500 mg by mouth 2 (two) times daily.  . metoprolol tartrate (LOPRESSOR) 25 MG tablet TAKE 1/2 TABLET BY MOUTH DAILY ( TAKE A WHOLE TABLET DAILY IF SBP>150)  . montelukast (SINGULAIR) 10 MG tablet TAKE 1 TABLET (10 MG TOTAL) BY MOUTH DAILY.  . nitroGLYCERIN (NITROSTAT) 0.4 MG SL tablet Place 1 tablet (0.4 mg total) under the tongue every 5 (five) minutes as needed for chest pain.  [name] Kitchen omeprazole (PRILOSEC) 40 MG capsule Take 40 mg by mouth every morning.   . ondansetron (ZOFRAN ODT) 4 MG disintegrating tablet Take 1 tablet (4 mg total) by mouth every 8 (eight) hours as needed for nausea or vomiting.  [name] Kitchen oxyCODONE (OXY IR/ROXICODONE) 5 MG immediate release tablet Take 1 tablet (5 mg total) by mouth every 8 (eight) hours as needed for severe pain.  . predniSONE (DELTASONE) 5 MG tablet Take 5 mg by mouth every morning.   . promethazine (PHENERGAN) 25 MG tablet Take 25 mg by mouth at bedtime as needed for nausea or vomiting. For nausea and sleep    . rosuvastatin (CRESTOR) 20 MG tablet TAKE 1 TABLET (20 MG TOTAL) BY MOUTH DAILY. NEEDS TO APPT FOR FUTURE REFILLS  . sertraline (ZOLOFT) 100 MG tablet Take 100 mg by mouth every morning.  . VENTOLIN HFA 108 (90 Base) MCG/ACT inhaler INHALE 2 PUFFS INTO THE LUNGS EVERY 6 (SIX) HOURS AS NEEDED FOR WHEEZING OR SHORTNESS OF BREATH.   No facility-administered encounter medications on file as of 12/28/2016.      Review of Systems  Constitutional:   No  weight loss, night sweats,  Fevers, chills,  +fatigue, or  lassitude.  HEENT:   No headaches,  Difficulty swallowing,  Tooth/dental problems, or  Sore throat,                No sneezing, itching, ear ache, +nasal congestion, post nasal drip,   CV:  No chest pain,  Orthopnea, PND, swelling in lower extremities, anasarca, dizziness, palpitations, syncope.   GI  No heartburn, indigestion, abdominal pain, nausea, vomiting, diarrhea, change in bowel habits, loss of appetite, bloody stools.   Resp:    No non-productive cough,  No coughing up of blood.  No change in color of mucus.  No wheezing.  No chest wall deformity  Skin: no rash or lesions.  GU: no dysuria, change in color of urine, no urgency or frequency.  No flank pain, no hematuria   MS:  No joint pain or swelling.  No decreased range of motion.  No back pain.    Physical Exam  BP 132/82 (BP Location: Right Arm, Cuff Size: Normal)   Pulse 85   Ht 5\' 1"  (1.549 m)   Wt 195 lb 12.8 oz (88.8 kg)   SpO2 98%   BMI 37.00 kg/m   GEN: A/Ox3; pleasant , NAD, Obese    HEENT:  [place]/AT,  EACs-clear, TMs-wnl, NOSE-clear, THROAT-clear, no lesions, no postnasal drip or exudate noted.   NECK:  Supple w/ fair ROM; no JVD; normal carotid impulses w/o bruits; no thyromegaly or nodules palpated; no lymphadenopathy.    RESP  Clear  P &  A; w/o, wheezes/ rales/ or rhonchi. no accessory muscle use, no dullness to percussion  CARD:  RRR, no m/r/g, no peripheral edema, pulses intact, no cyanosis or  clubbing.  GI:   Soft & nt; nml bowel sounds; no organomegaly or masses detected.   Musco: Warm bil, no deformities or joint swelling noted.   Neuro: alert, no focal deficits noted.    Skin: Warm, no lesions or rashes    Lab Results:  CBC  BNP No results found for: BNP  Imaging: Dg Chest 2 View  Result Date: 12/28/2016 CLINICAL DATA:  Follow-up nodules EXAM: CHEST  2 VIEW COMPARISON:  None. FINDINGS: Previously seen nodular opacities over the right upper lobe are improved/ resolved. No suspicious nodular densities. Linear scarring in the lingula. Heart is normal size. No effusions. IMPRESSION: Resolution of the previously seen right upper lobe nodules. No active disease. Electronically Signed   By: Rolm Baptise M.D.   On: 12/28/2016 10:15     Assessment & Plan:   Chronic allergic rhinitis Mild flare   Plan  Change claritin to zyrtec .  Add saline rinses   Extrinsic asthma Improved control  Control for triggers - AR /GERD   Plan  Patient Instructions  Add Saline nasal rinses 2-3 twice daily  .  Add Zyrtec 10mg  At bedtime (in place of claritin )    Continue Symbicort, Flonase and Singulair .  follow up Dr. Lamonte Sakai  In 2-3 months and As needed   Please contact office for sooner follow up if symptoms do not improve or worsen or seek emergency care        Lung nodules Lung nodules  ? From chronic aspiraiton  N/v/ have resolved with lap band release.  CXR today shows resolution of lung nodules .       Rexene Edison, NP 12/28/2016

## 2016-12-30 ENCOUNTER — Other Ambulatory Visit: Payer: Self-pay | Admitting: Emergency Medicine

## 2017-01-01 ENCOUNTER — Other Ambulatory Visit: Payer: Self-pay | Admitting: Physical Medicine & Rehabilitation

## 2017-01-01 DIAGNOSIS — M47816 Spondylosis without myelopathy or radiculopathy, lumbar region: Secondary | ICD-10-CM

## 2017-01-01 DIAGNOSIS — M797 Fibromyalgia: Secondary | ICD-10-CM

## 2017-01-04 ENCOUNTER — Other Ambulatory Visit: Payer: Self-pay | Admitting: Cardiology

## 2017-01-04 NOTE — Telephone Encounter (Signed)
Rx request sent to pharmacy.  

## 2017-01-08 ENCOUNTER — Telehealth: Payer: Self-pay | Admitting: *Deleted

## 2017-01-08 NOTE — Telephone Encounter (Signed)
El[place]ra called and says that Dr Detterding wants Dr Naaman Plummer to assume the prescribing for her cyclobnezaprine.  He has prescribed it for her.  I will let her know.

## 2017-01-13 ENCOUNTER — Telehealth: Payer: Self-pay | Admitting: Adult Health

## 2017-01-13 MED ORDER — AZITHROMYCIN 250 MG PO TABS: ORAL_TABLET | ORAL | 0 refills | Status: DC

## 2017-01-13 NOTE — Telephone Encounter (Signed)
Ear ache on right side last week. Now both ears are hurting, pain is spreading down to neck. Body aches. Wheezing. Fatigue. Cough, productive yellow mucus. Fever. No chest pain. Sob.   [facility] [address]   Patient states she saw TP last week and her right ear was hurting. Now a week later, both ears are hurting and the pain is spreading down to her neck on both sides. She has body aches all over. She has been wheezing, increased fatigue, and coughing with yellow mucus. She also has a low grade fever. Denied any chest pains or SOB.   [facility] [address]   PM, please advise. Thanks!

## 2017-01-13 NOTE — Telephone Encounter (Signed)
Pharm is [facility] on [address]. Had to cancel preop visit with doc at [facility].

## 2017-01-13 NOTE — Telephone Encounter (Signed)
Call in z pack.   

## 2017-01-13 NOTE — Telephone Encounter (Signed)
Called and spoke with pt and she is aware of zpak that has been sent to the pharmacy.

## 2017-01-19 ENCOUNTER — Other Ambulatory Visit: Payer: Self-pay | Admitting: Cardiology

## 2017-01-20 ENCOUNTER — Telehealth: Payer: Self-pay | Admitting: Cardiology

## 2017-01-21 ENCOUNTER — Other Ambulatory Visit: Payer: Self-pay | Admitting: Cardiology

## 2017-01-22 ENCOUNTER — Encounter: Payer: Medicare Other | Admitting: Registered Nurse

## 2017-01-25 ENCOUNTER — Other Ambulatory Visit: Payer: Self-pay | Admitting: Physical Medicine & Rehabilitation

## 2017-01-25 DIAGNOSIS — M797 Fibromyalgia: Secondary | ICD-10-CM

## 2017-01-25 DIAGNOSIS — M47816 Spondylosis without myelopathy or radiculopathy, lumbar region: Secondary | ICD-10-CM

## 2017-01-26 ENCOUNTER — Ambulatory Visit (INDEPENDENT_AMBULATORY_CARE_PROVIDER_SITE_OTHER): Payer: Medicare Other | Admitting: Cardiology

## 2017-01-26 ENCOUNTER — Encounter: Payer: Self-pay | Admitting: Registered Nurse

## 2017-01-26 ENCOUNTER — Encounter: Payer: Medicare Other | Attending: Registered Nurse | Admitting: Registered Nurse

## 2017-01-26 VITALS — BP 139/79 | HR 75 | Resp 14

## 2017-01-26 VITALS — BP 128/62 | HR 71 | Ht 61.0 in | Wt 206.8 lb

## 2017-01-26 DIAGNOSIS — M47816 Spondylosis without myelopathy or radiculopathy, lumbar region: Secondary | ICD-10-CM

## 2017-01-26 DIAGNOSIS — Z8614 Personal history of Methicillin resistant Staphylococcus aureus infection: Secondary | ICD-10-CM | POA: Insufficient documentation

## 2017-01-26 DIAGNOSIS — I252 Old myocardial infarction: Secondary | ICD-10-CM | POA: Diagnosis not present

## 2017-01-26 DIAGNOSIS — Z992 Dependence on renal dialysis: Secondary | ICD-10-CM | POA: Insufficient documentation

## 2017-01-26 DIAGNOSIS — D631 Anemia in chronic kidney disease: Secondary | ICD-10-CM | POA: Insufficient documentation

## 2017-01-26 DIAGNOSIS — Z800 Family history of malignant neoplasm of digestive organs: Secondary | ICD-10-CM | POA: Insufficient documentation

## 2017-01-26 DIAGNOSIS — Z7952 Long term (current) use of systemic steroids: Secondary | ICD-10-CM | POA: Insufficient documentation

## 2017-01-26 DIAGNOSIS — E785 Hyperlipidemia, unspecified: Secondary | ICD-10-CM | POA: Insufficient documentation

## 2017-01-26 DIAGNOSIS — N186 End stage renal disease: Secondary | ICD-10-CM

## 2017-01-26 DIAGNOSIS — I2510 Atherosclerotic heart disease of native coronary artery without angina pectoris: Secondary | ICD-10-CM | POA: Insufficient documentation

## 2017-01-26 DIAGNOSIS — G894 Chronic pain syndrome: Secondary | ICD-10-CM | POA: Diagnosis not present

## 2017-01-26 DIAGNOSIS — G4733 Obstructive sleep apnea (adult) (pediatric): Secondary | ICD-10-CM | POA: Diagnosis not present

## 2017-01-26 DIAGNOSIS — K219 Gastro-esophageal reflux disease without esophagitis: Secondary | ICD-10-CM | POA: Diagnosis not present

## 2017-01-26 DIAGNOSIS — Z87891 Personal history of nicotine dependence: Secondary | ICD-10-CM | POA: Insufficient documentation

## 2017-01-26 DIAGNOSIS — Z8673 Personal history of transient ischemic attack (TIA), and cerebral infarction without residual deficits: Secondary | ICD-10-CM | POA: Diagnosis not present

## 2017-01-26 DIAGNOSIS — Z9884 Bariatric surgery status: Secondary | ICD-10-CM | POA: Insufficient documentation

## 2017-01-26 DIAGNOSIS — M25552 Pain in left hip: Secondary | ICD-10-CM

## 2017-01-26 DIAGNOSIS — E1122 Type 2 diabetes mellitus with diabetic chronic kidney disease: Secondary | ICD-10-CM | POA: Insufficient documentation

## 2017-01-26 DIAGNOSIS — Z955 Presence of coronary angioplasty implant and graft: Secondary | ICD-10-CM | POA: Insufficient documentation

## 2017-01-26 DIAGNOSIS — I6521 Occlusion and stenosis of right carotid artery: Secondary | ICD-10-CM

## 2017-01-26 DIAGNOSIS — E1140 Type 2 diabetes mellitus with diabetic neuropathy, unspecified: Secondary | ICD-10-CM

## 2017-01-26 DIAGNOSIS — Z794 Long term (current) use of insulin: Secondary | ICD-10-CM

## 2017-01-26 DIAGNOSIS — I120 Hypertensive chronic kidney disease with stage 5 chronic kidney disease or end stage renal disease: Secondary | ICD-10-CM | POA: Diagnosis not present

## 2017-01-26 DIAGNOSIS — M797 Fibromyalgia: Secondary | ICD-10-CM | POA: Insufficient documentation

## 2017-01-26 DIAGNOSIS — Z8249 Family history of ischemic heart disease and other diseases of the circulatory system: Secondary | ICD-10-CM | POA: Insufficient documentation

## 2017-01-26 DIAGNOSIS — Z808 Family history of malignant neoplasm of other organs or systems: Secondary | ICD-10-CM | POA: Insufficient documentation

## 2017-01-26 DIAGNOSIS — Z951 Presence of aortocoronary bypass graft: Secondary | ICD-10-CM | POA: Insufficient documentation

## 2017-01-26 DIAGNOSIS — I480 Paroxysmal atrial fibrillation: Secondary | ICD-10-CM | POA: Diagnosis not present

## 2017-01-26 DIAGNOSIS — I2119 ST elevation (STEMI) myocardial infarction involving other coronary artery of inferior wall: Secondary | ICD-10-CM

## 2017-01-26 DIAGNOSIS — M25551 Pain in right hip: Secondary | ICD-10-CM | POA: Diagnosis not present

## 2017-01-26 DIAGNOSIS — I358 Other nonrheumatic aortic valve disorders: Secondary | ICD-10-CM

## 2017-01-26 DIAGNOSIS — Z9851 Tubal ligation status: Secondary | ICD-10-CM | POA: Diagnosis not present

## 2017-01-26 DIAGNOSIS — Z9861 Coronary angioplasty status: Secondary | ICD-10-CM | POA: Diagnosis not present

## 2017-01-26 DIAGNOSIS — Z9049 Acquired absence of other specified parts of digestive tract: Secondary | ICD-10-CM | POA: Insufficient documentation

## 2017-01-26 DIAGNOSIS — M069 Rheumatoid arthritis, unspecified: Secondary | ICD-10-CM | POA: Diagnosis not present

## 2017-01-26 DIAGNOSIS — E669 Obesity, unspecified: Secondary | ICD-10-CM | POA: Insufficient documentation

## 2017-01-26 DIAGNOSIS — E1159 Type 2 diabetes mellitus with other circulatory complications: Secondary | ICD-10-CM | POA: Diagnosis not present

## 2017-01-26 DIAGNOSIS — Z940 Kidney transplant status: Secondary | ICD-10-CM | POA: Insufficient documentation

## 2017-01-26 DIAGNOSIS — M7581 Other shoulder lesions, right shoulder: Secondary | ICD-10-CM

## 2017-01-26 DIAGNOSIS — Z01810 Encounter for preprocedural cardiovascular examination: Secondary | ICD-10-CM

## 2017-01-26 DIAGNOSIS — J449 Chronic obstructive pulmonary disease, unspecified: Secondary | ICD-10-CM | POA: Diagnosis not present

## 2017-01-26 DIAGNOSIS — I10 Essential (primary) hypertension: Secondary | ICD-10-CM

## 2017-01-26 DIAGNOSIS — I6523 Occlusion and stenosis of bilateral carotid arteries: Secondary | ICD-10-CM | POA: Diagnosis not present

## 2017-01-26 MED ORDER — DILTIAZEM HCL ER COATED BEADS 120 MG PO CP24: 120.0000 mg | ORAL_CAPSULE | Freq: Every day | ORAL | 3 refills | Status: DC

## 2017-01-26 MED ORDER — CLOPIDOGREL BISULFATE 75 MG PO TABS: 75.0000 mg | ORAL_TABLET | Freq: Every day | ORAL | 3 refills | Status: DC

## 2017-01-26 NOTE — Patient Instructions (Addendum)
No change with current medications   Cardiac clearance for surgery -- inter mediate-high risk for surgery  No additional cardiac evaluation needed  Your physician wants you to follow-up in 6 months with [name].You will receive a reminder letter in the mail two months in advance. If you don't receive a letter, please call our office to schedule the follow-up appointment.   If you need a refill on your cardiac medications before your next appointment, please call your pharmacy.

## 2017-01-26 NOTE — Progress Notes (Signed)
Subjective:    Patient ID: Katherine [name], female    DOB: [birth year], 61 y.o.   MRN: [id number]  HPI: Katherine [name] is a 61 year old female who returns for follow up appointment for chronic pain and medication refill. She states her pain is located in her bilateral shoulder, bilateral hips R>L, lower back and bilateral knees. She rates her pain 7. Her current exercise regime is walking and performing stretching exercises.   She has been diagnosed with Duodenal Adenoma, awaiting surgerty date at [facility].  Last UDS was performed on 11/16/2016, it was consistent.   Pain Inventory Average Pain 7 Pain Right Now 7 My pain is sharp, burning and aching  In the last 24 hours, has pain interfered with the following? General activity 7 Relation with others 7 Enjoyment of life 7 What TIME of day is your pain at its worst? morning, night Sleep (in general) Fair  Pain is worse with: walking, bending, standing and some activites Pain improves with: rest, heat/ice and medication Relief from Meds: 5  Mobility walk without assistance how many minutes can you walk? 20 ability to climb steps?  yes do you drive?  yes Do you have any goals in this area?  yes  Function not employed: date last employed . disabled: date disabled . Do you have any goals in this area?  yes  Neuro/Psych weakness numbness tingling trouble walking depression anxiety  Prior Studies Any changes since last visit?  no  Physicians involved in your care Any changes since last visit?  no   Family History  Problem Relation Age of Onset  . Cancer Mother        liver  . Heart disease Father   . Cancer Father        colon  . Arrhythmia Brother        Atrial Fibrillation  . Arrhythmia Paternal Aunt        Atrial Fibrillation   Social History   Social History  . Marital status: Married    Spouse name: N/A  . Number of children: N/A  . Years of education: N/A   Social History Main Topics  .  Smoking status: Former Smoker    Packs/day: 1.00    Years: 30.00    Types: Cigarettes    Quit date: 08/17/2002  . Smokeless tobacco: Never Used  . Alcohol use No  . Drug use: No  . Sexual activity: Not Asked   Other Topics Concern  . None   Social History Narrative   She is currently married, and the caregiver of her husband who is recovering from surgery for tongue cancer now diagnosed with lung cancer. Prior to his diagnosis of her husband, she actually had adopted a 60-year-old child who she knows caring for as well. With all the surgeries, they have been quite financially troubled. Thanks the help of her community and church, they have been able to stay "alfoat."     She is a former smoker who quit in 2004 after a 30-pack-year history.   She is active chasing a 31-year-old child, does not do routine exercise. She's been quite depressed with the condition of her husband, and admits to eating comfort herself.   She does not drink alcohol.   Past Surgical History:  Procedure Laterality Date  . CARDIAC CATHETERIZATION  5/'01, 3/'02, 8/'03, 10/'04; 1/'15   08/22/2013: LAD & RCA 100%; LIMA-LAD & SVG-rPDA patent; Cx-- OM1 60%, OM2 ostial ~50%; SVG-D1 - 80%  mid, 50% distal ISR --PCI  . CARDIAC CATHETERIZATION  5/'01, 3/'02, 8/'03, 10/'04; 1/'15   08/22/2013: LAD & RCA 100%; LIMA-LAD & SVG-rPDA patent; Cx-- OM1 60%, OM2 ostial ~50%; SVG-D1 - 80% mid, 50% distal ISR --PCI  . CATHETER REMOVAL    . CHOLECYSTECTOMY N/A 10/29/2014   Procedure: LAPAROSCOPIC CHOLECYSTECTOMY WITH INTRAOPERATIVE CHOLANGIOGRAM;  Surgeon: Excell Seltzer, MD;  Location: [facility] ORS;  Service: General;  Laterality: N/A;  . CORONARY ANGIOPLASTY  1994   x5  . CORONARY ARTERY BYPASS GRAFT  1995   LIMA-LAD, SVG-RPDA, SVG-D1  . ESOPHAGOGASTRODUODENOSCOPY N/A 10/15/2016   Procedure: ESOPHAGOGASTRODUODENOSCOPY (EGD);  Surgeon: Wilford Corner, MD;  Location: [facility] ENDOSCOPY;  Service: Endoscopy;  Laterality: N/A;  . INCISE AND DRAIN  [facility]    . KIDNEY TRANSPLANT  1991  . LAPAROSCOPIC GASTRIC BANDING  04/2004; 10/'09, 2/'10   Port Replacement x 2  . LEFT HEART CATHETERIZATION WITH CORONARY/GRAFT ANGIOGRAM N/A 08/23/2013   Procedure: LEFT HEART CATHETERIZATION WITH Beatrix Fetters;  Surgeon: Wellington Hampshire, MD;  Location: [facility] CATH LAB;  Service: Cardiovascular;  Laterality: N/A;  . Lower Extremity Arterial Dopplers  08/2013   ABI: R 0.96, L 1.04  . MULTIPLE TOOTH EXTRACTIONS  age 67  . PERCUTANEOUS CORONARY STENT INTERVENTION (PCI-S)  5/'01, 3/'02, 8/'03, 10/'04;   '01 - S660 BMS 2.5 x 9 - dSVG-D1 into D1; '02- post-stent stenosis - 2.5 x 8 Pixel BMS; '8\03: ISR/Thrombosis into native D1 - AngioJet, 2.5 x 13 Pixel; '04 - ISR 95% - covered stented area with Taxus DES 2.5 mm x 20 (2.88)  . PERCUTANEOUS CORONARY STENT INTERVENTION (PCI-S)  08/22/2013   mid SVG-D1 80%; distal stent ~50% ISR; Promus Prermier DES 2.75 mm xc 20 mm (2.8 mm)  . PERCUTANEOUS CORONARY STENT INTERVENTION (PCI-S)  08/23/2013   Procedure: PERCUTANEOUS CORONARY STENT INTERVENTION (PCI-S);  Surgeon: Wellington Hampshire, MD;  Location: [facility] CATH LAB;  Service: Cardiovascular;;  . PORT-A-CATH REMOVAL     kidney  . TRANSTHORACIC ECHOCARDIOGRAM  02/2013   NL LV Size, EF 55-60%; MAC with Mild MR.  . TUBAL LIGATION    . wrist fistula repair Left    dialysis for one year   Past Medical History:  Diagnosis Date  . Anxiety   . Atrial fibrillation ([place])   . Bilateral carotid artery stenosis    Carotid duplex 04/6282: 6-62% LICA, 94-76% RICA, >54% RECA, f/u 1 yr suggested  . CAD (coronary artery disease) of bypass graft 5/01; 3/'02, 8/'03, 10/'04; 1/15   PCI x 5 to SVG-D1   . CAD in native artery 07/1993    3 Vessel Disease (LAD-D1 & RCA) -- CABG  . CAD S/P percutaneous coronary angioplasty    PCI to SVG-D1 insertion/native D1 x 4 = '01 -(S660 BMS 2.5 x 9 - insertion into D1; '02 - distal overlap ACS Pixel 2.5 x 8  BMS; '03 distal/native ISR/Thrombosis - Pixel  2.5 x 13; '04 - ISR-  Taxus 2.5 x 20 (covered all);; 1/15 - mid SVG-D1 (50% distal ISR) - Promus P 2.75 x 20 -- 2.8 mm  . COPD mixed type ([place])    Followed by Dr. Lamonte Sakai "pulmonologist said no COPD"  . Depression with anxiety   . Diabetes mellitus type 2 in obese ([place])   . Dyslipidemia, goal LDL below 70    08/2012: TC 137, TG 200, HDL 32!, LDL 45; on statin (followed by Dr.Deterding)  . Enlarged heart    "told one time"  . ESRD (end  stage renal disease) ([place]) 1991   s/p Cadaveric Renal Transplant [facility] - Dr. Jimmy Footman)   . Family history of adverse reaction to anesthesia    mom's bp dropped during/after anesthesia  . Fibromyalgia   . GERD (gastroesophageal reflux disease)   . Glomerulonephritis, chronic, rapidly progressive 89  . 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 07/1993   Rescue PTCA of RCA -- referred for CABG.  . H/O: GI bleed   . Hypertension associated with diabetes ([place])   . MRSA (methicillin resistant staph aureus) culture positive   . Obesity    s/p Lap Band 04/2004- Port Replacement 10/09 & 2/10 for infection; Dr. Excell Seltzer.  . OSA (obstructive sleep apnea)    no longer on CPAP or home O2  . PAD (peripheral artery disease) ([place]) 08/2013   LEA Dopplers to be read by Dr. Fletcher Anon  . PAF (paroxysmal atrial fibrillation) ([place])    Noted on CardioNet Monitor 11/5-18 - rate ~112  . Recurrent boils    Bilateral Groin  . Rheumatoid arthritis ([place])    Per Patient Report; associated with OA  . 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 Servando Snare: LIMA-LAD, SVG-bifurcatingD1, SVG-rPDA  . S/p cadaver renal transplant [place]  . Stroke [facility]) 2012   "right eye stroke- half blind now"  . Unstable angina ([place]) 5/01; 3/'02, 8/'03, 10/'04; 1/15   x 5 occurences since Inf-Post STEMI in 1994   BP 139/79 (BP Location: Right Wrist, Patient Position: Sitting, Cuff Size: Normal)   Pulse 75   Resp 14   SpO2 95%   Opioid Risk Score:   Fall Risk Score:  `1  Depression  screen PHQ 2/9  Depression screen [facility] 2/9 05/19/2016 03/30/2016  Decreased Interest 3 3  Down, Depressed, Hopeless 3 3  PHQ - 2 Score 6 6  Altered sleeping - 3  Tired, decreased energy - 3  Change in appetite - 2  Feeling bad or failure about yourself  - 3  Trouble concentrating - 2  Moving slowly or fidgety/restless - 0  Suicidal thoughts - 0  PHQ-9 Score - 19  Some recent data might be hidden    Review of Systems  Constitutional: Positive for appetite change, chills, fever and unexpected weight change.  HENT: Negative.   Eyes: Negative.   Respiratory: Positive for cough, shortness of breath and wheezing.   Cardiovascular: Positive for leg swelling.  Gastrointestinal: Positive for abdominal pain and nausea.  Endocrine:       High blood sugar  Genitourinary: Negative.   Musculoskeletal: Positive for arthralgias and gait problem.  Skin: Negative.   Allergic/Immunologic: Negative.   Neurological: Positive for weakness and numbness.       Tingling  Hematological: Bruises/bleeds easily.  Psychiatric/Behavioral: Positive for dysphoric mood. The patient is nervous/anxious.        Objective:   Physical Exam  Constitutional: She is oriented to person, place, and time. She appears well-developed and well-nourished.  HENT:  Head: Normocephalic and atraumatic.  Neck: Normal range of motion. Neck supple.  Cardiovascular: Normal rate and regular rhythm.   Pulmonary/Chest: Effort normal and breath sounds normal.  Musculoskeletal:  Normal Muscle Bulk and Muscle Testing Reveals: Upper Extremities: Full ROM and Muscle Strength 5/5 Bilateral AC Joint Tenderness Thoracic Paraspinal Tenderness: T-1-T-3 Lumbar Paraspinal Tenderness: L-4-L-5 Lower Extremities: Full ROM and Muscle Strength 5/5 Arises from chair with ease Narrow Based Gait  Neurological: She is alert and oriented to person, place, and time.  Skin: Skin is warm and dry.  Psychiatric: She has a normal mood and affect.    Nursing note and vitals reviewed.         Assessment & Plan:  1. Fibromyalgia: Continue with HEP as tolerated. Continue Tegretol and Cymbalta. 01/26/2017 2. Rheumatoid Arthritis: Continue Oxycodone, no script given. Continue to Monitor. 01/26/2017 3. Osteoarthritis--polyarticular: Continue with current medication regime and continue to monitor.01/26/2017 4. ESRD with hx of renal transplant on chronic steroids: Nephrology Following.01/26/2017 5. Right rotator cuff tendonitis/like degenerative/inflammatory changes in the right shoulder also. Continue HEP and Continue to Monitor.01/26/2017 6. Muscle Spasm: Continue Flexeril  20 minutes of face to face patient care time was spent during this visit. All questions were encouraged and answered.  F/U in 1 month

## 2017-01-26 NOTE — Progress Notes (Signed)
PCP: Mauricia Area, MD  Clinic Note: Chief Complaint  Patient presents with  . Follow-up    pt denied chest pain, pt stated she good  . Pre-op Exam  . Coronary Artery Disease    HPI: Katherine [name] is a 61 y.o. female with a PMH notable for CAD (CABG & PCI), PAD along with A. Fib  who presents today for delayed follow-up and potentially preoperative risk assessment.  She is a former patient of Dr. Terance Ice. She is morbidly obese  Saw Angelena Form, [place] in July 2017 - ordered Echo & Carotid  Recent Hospitalizations:  Feb & March 2018 -> ended up with OP Colonoscopy -- had to loosen the Lap Band to do EGD. - Found Mass (but no bleeding)  - Due to See Dr. Stephanie Acre in Aug @ [facility].    -- when loosen the band, she gains wgt, but no nausea.  -- when tighten band - throws up.  Studies Personally Reviewed - (if available, images/films reviewed: From Epic Chart or Care Everywhere)  ST after ER visit for CP in Sept 2017 -- sharp / stabbing CP --> back; ended up Dx with PNA  Myoview 03/2016: EF 62%. LOW RISK. C/W prior MI - no Ischemia. Apical hypokinesis.  2D Echo 02/2016: EF 55-60%. Septal dyssynergy from CABG GR 2 DD. Aortic valve is trileaflet the functional bicuspid with sclerosis but not yet notable for stenosis stenosis.; no Significant change  Carotids July 2017: Stable heterogeneous plaque, with shadowing, bilaterally. Progression of RICA stenosis, high end of 40-59% range. Stable 0-26% LICA stenosis. >50% RECA stenosis.  Normal subclavian arteries, bilaterally.  Patent vertebral arteries with antegrade flow  Interval History: Katherine [name] presents today overall without any major cardiac symptoms. She actually feels okay but just somewhat upset about the fact that her weight keeps going up. It seems like whenever the lap band is tight she loses the weight but is always throwing up. Once the loosen it she actually feels better gains so much weight back however. She also knows  that her energy and blood pressure improved. She has baseline shortness of breath from her COPD and asthma type symptoms, but no real PND orthopnea or edema. Her allergy symptoms and acting up quite a bit next has her reactive airways disease active. She also notices his recently diagnosed with peripheral neuralgia the goes along with her fibromyalgia ostial arthritis and rheumatoid arthritis. She notes that really doesn't eat much more than raisin bran and dried fruit but still gains weight. This makes her somewhat depressed and she just feels as though she can't with losing.  She apparently needs to have a surgery to remove some type of mass from her abdomen that has to be done at [facility]. She is followed by Dr. Jimmy Footman from nephrology & Dr. [name] from GI. She has not had any symptoms to speak of the suggest recurrence of A. fib. No sensation of rapid irregular heartbeats or palpitations. She has not had any resting or exertional chest tightness or pressure. No real PND or orthopnea and has mild baseline edema. No syncope/Near syncope or TIA/amaurosis fugax.  No melena, hematochezia, hematuria, or epstaxis. No claudication.  ROS: A comprehensive was performed. Review of Systems  Constitutional: Positive for malaise/fatigue.  HENT: Positive for congestion. Negative for sinus pain.   Respiratory: Positive for cough, shortness of breath (Related to her asthma) and wheezing.   Cardiovascular:       Per history of present illness  Gastrointestinal: Positive  for abdominal pain. Negative for blood in stool and melena.  Genitourinary: Negative for frequency and hematuria.  Musculoskeletal: Positive for back pain, joint pain and myalgias (Fibromyalgia). Negative for falls.  Neurological: Negative for dizziness and focal weakness.  Endo/Heme/Allergies: Positive for environmental allergies.  Psychiatric/Behavioral: Positive for depression. Negative for memory loss. The patient does not have insomnia.    All other systems reviewed and are negative.   I have reviewed and (if needed) personally updated the patient's problem list, medications, allergies, past medical and surgical history, social and family history.   Past Medical History:  Diagnosis Date  . Anxiety   . Bilateral carotid artery stenosis    Carotid duplex 12/3612: 4-31% LICA, 54-00% RICA, >86% RECA, f/u 1 yr suggested  . CAD (coronary artery disease) of bypass graft 5/01; 3/'02, 8/'03, 10/'04; 1/15   PCI x 5 to SVG-D1   . CAD in native artery 07/1993    3 Vessel Disease (LAD-D1 & RCA) -- CABG  . CAD S/P percutaneous coronary angioplasty    PCI to SVG-D1 insertion/native D1 x 4 = '01 -(S660 BMS 2.5 x 9 - insertion into D1; '02 - distal overlap ACS Pixel 2.5 x 8  BMS; '03 distal/native ISR/Thrombosis - Pixel 2.5 x 13; '04 - ISR-  Taxus 2.5 x 20 (covered all);; 1/15 - mid SVG-D1 (50% distal ISR) - Promus P 2.75 x 20 -- 2.8 mm  . COPD mixed type ([place])    Followed by Dr. Lamonte Sakai "pulmonologist said no COPD"  . Depression with anxiety   . Diabetes mellitus type 2 in obese ([place])   . Dyslipidemia, goal LDL below 70    08/2012: TC 137, TG 200, HDL 32!, LDL 45; on statin (followed by Dr.Deterding)  . ESRD (end stage renal disease) ([place]) 1991   s/p Cadaveric Renal Transplant [facility] - Dr. Jimmy Footman)   . Family history of adverse reaction to anesthesia    mom's bp dropped during/after anesthesia  . Fibromyalgia   . GERD (gastroesophageal reflux disease)   . Glomerulonephritis, chronic, rapidly progressive 55  . 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 07/1993   Rescue PTCA of RCA -- referred for CABG.  . H/O: GI bleed   . Hypertension associated with diabetes ([place])   . MRSA (methicillin resistant staph aureus) culture positive   . Obesity    s/p Lap Band 04/2004- Port Replacement 10/09 & 2/10 for infection; Dr. Excell Seltzer.  . OSA (obstructive sleep apnea)    no longer on CPAP or home O2  . PAD  (peripheral artery disease) ([place]) 08/2013   LEA Dopplers to be read by Dr. Fletcher Anon  . PAF (paroxysmal atrial fibrillation) ([place]) 06/2014   Noted on CardioNet Monitor  - rate ~112; no recurrent symptoms. Nonionic regulation because of frequent GI procedures. Patient prefers Plavix  . Recurrent boils    Bilateral Groin  . Rheumatoid arthritis ([place])    Per Patient Report; associated with OA  . 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 Servando Snare: LIMA-LAD, SVG-bifurcatingD1, SVG-rPDA  . S/p cadaver renal transplant [facility]  . Stroke [facility]) 2012   "right eye stroke- half blind now"  . Unstable angina ([place]) 5/01; 3/'02, 8/'03, 10/'04; 1/15   x 5 occurences since Inf-Post STEMI in 1994    Past Surgical History:  Procedure Laterality Date  . CARDIAC CATHETERIZATION  5/'01, 3/'02, 8/'03, 10/'04; 1/'15   08/22/2013: LAD & RCA 100%; LIMA-LAD & SVG-rPDA patent; Cx-- OM1 60%, OM2  ostial ~50%; SVG-D1 - 80% mid, 50% distal ISR --PCI  . CARDIAC CATHETERIZATION  5/'01, 3/'02, 8/'03, 10/'04; 1/'15   08/22/2013: LAD & RCA 100%; LIMA-LAD & SVG-rPDA patent; Cx-- OM1 60%, OM2 ostial ~50%; SVG-D1 - 80% mid, 50% distal ISR --PCI  . CATHETER REMOVAL    . CHOLECYSTECTOMY N/A 10/29/2014   Procedure: LAPAROSCOPIC CHOLECYSTECTOMY WITH INTRAOPERATIVE CHOLANGIOGRAM;  Surgeon: Excell Seltzer, MD;  Location: [facility] ORS;  Service: General;  Laterality: N/A;  . CORONARY ANGIOPLASTY  1994   x5  . CORONARY ARTERY BYPASS GRAFT  1995   LIMA-LAD, SVG-RPDA, SVG-D1  . ESOPHAGOGASTRODUODENOSCOPY N/A 10/15/2016   Procedure: ESOPHAGOGASTRODUODENOSCOPY (EGD);  Surgeon: Wilford Corner, MD;  Location: [facility] ENDOSCOPY;  Service: Endoscopy;  Laterality: N/A;  . INCISE AND DRAIN ABCESS    . KIDNEY TRANSPLANT  1991  . LAPAROSCOPIC GASTRIC BANDING  04/2004; 10/'09, 2/'10   Port Replacement x 2  . LEFT HEART CATHETERIZATION WITH CORONARY/GRAFT ANGIOGRAM N/A 08/23/2013   Procedure: LEFT HEART CATHETERIZATION WITH Beatrix Fetters;  Surgeon: Wellington Hampshire, MD;  Location: [facility] CATH LAB;  Service: Cardiovascular;  Laterality: N/A;  . Lower Extremity Arterial Dopplers  08/2013   ABI: R 0.96, L 1.04  . MULTIPLE TOOTH EXTRACTIONS  age 71  . NM MYOVIEW LTD  03/2016   EF 62%. LOW RISK. C/W prior MI - no Ischemia. Apical hypokinesis.  [name] Kitchen PERCUTANEOUS CORONARY STENT INTERVENTION (PCI-S)  5/'01, 3/'02, 8/'03, 10/'04;   '01 - S660 BMS 2.5 x 9 - dSVG-D1 into D1; '02- post-stent stenosis - 2.5 x 8 Pixel BMS; '8\03: ISR/Thrombosis into native D1 - AngioJet, 2.5 x 13 Pixel; '04 - ISR 95% - covered stented area with Taxus DES 2.5 mm x 20 (2.88)  . PERCUTANEOUS CORONARY STENT INTERVENTION (PCI-S)  08/22/2013   mid SVG-D1 80%; distal stent ~50% ISR; Promus Prermier DES 2.75 mm xc 20 mm (2.8 mm)  . PERCUTANEOUS CORONARY STENT INTERVENTION (PCI-S)  08/23/2013   Procedure: PERCUTANEOUS CORONARY STENT INTERVENTION (PCI-S);  Surgeon: Wellington Hampshire, MD;  Location: [facility] CATH LAB;  Service: Cardiovascular;;  . PORT-A-CATH REMOVAL     kidney  . TRANSTHORACIC ECHOCARDIOGRAM  02/2016   EF 55-60%. Septal dyssynergy from CABG GR 2 DD. Aortic valve is trileaflet the functional bicuspid with sclerosis but not yet notable for stenosis stenosis.; no Significant change  . TUBAL LIGATION    . wrist fistula repair Left    dialysis for one year    Current Meds  Medication Sig  . acetaminophen (TYLENOL) 500 MG tablet Take 1 tablet (500 mg total) by mouth every 6 (six) hours as needed for moderate pain or headache.  . azaTHIOprine (IMURAN) 50 MG tablet Take 125 mg by mouth every evening.   . budesonide-formoterol (SYMBICORT) 160-4.5 MCG/ACT inhaler Inhale 2 puffs into the lungs 2 (two) times daily.  . calcitRIOL (ROCALTROL) 0.25 MCG capsule Take 0.25 mcg by mouth every morning.   . carbamazepine (TEGRETOL) 200 MG tablet TAKE 1 TABLET BY MOUTH TWICE A DAY  . clopidogrel (PLAVIX) 75 MG tablet Take 1 tablet (75 mg total) by mouth daily.  . clotrimazole (MYCELEX) 10 MG troche Take 1  tablet (10 mg total) by mouth 5 (five) times daily.  . cyclobenzaprine (FLEXERIL) 10 MG tablet Take 1 tablet (10 mg total) by mouth 3 (three) times daily as needed for muscle spasms.  [name] Kitchen diltiazem (CARDIZEM CD) 120 MG 24 hr capsule Take 1 capsule (120 mg total) by mouth daily.  . ferrous sulfate 325 (  65 FE) MG tablet Take 1 tablet (325 mg total) by mouth 3 (three) times daily with meals.  . fluticasone (FLONASE) 50 MCG/ACT nasal spray PLACE 2 SPRAYS INTO THE NOSE DAILY (Patient taking differently: Place 2 sprays into each nostril two times a day)  . glimepiride (AMARYL) 4 MG tablet Take 4 mg by mouth every morning.  . insulin glargine (LANTUS) 100 UNIT/ML injection Inject 8 Units into the skin See admin instructions. In the morning as needed for BGL of 160 or greater  . LORazepam (ATIVAN) 1 MG tablet Take 1 mg by mouth every morning.   . metFORMIN (GLUCOPHAGE) 500 MG tablet Take 500 mg by mouth 2 (two) times daily.  . metoprolol tartrate (LOPRESSOR) 25 MG tablet TAKE 1/2 TABLET BY MOUTH DAILY ( TAKE A WHOLE TABLET DAILY IF SBP>150)  . montelukast (SINGULAIR) 10 MG tablet TAKE 1 TABLET (10 MG TOTAL) BY MOUTH DAILY.  . nitroGLYCERIN (NITROSTAT) 0.4 MG SL tablet Place 1 tablet (0.4 mg total) under the tongue every 5 (five) minutes as needed for chest pain.  [name] Kitchen omeprazole (PRILOSEC) 40 MG capsule Take 40 mg by mouth every morning.   . ondansetron (ZOFRAN ODT) 4 MG disintegrating tablet Take 1 tablet (4 mg total) by mouth every 8 (eight) hours as needed for nausea or vomiting.  [name] Kitchen oxyCODONE (OXY IR/ROXICODONE) 5 MG immediate release tablet Take 1 tablet (5 mg total) by mouth every 8 (eight) hours as needed for severe pain.  . predniSONE (DELTASONE) 5 MG tablet Take 5 mg by mouth every morning.   . promethazine (PHENERGAN) 25 MG tablet Take 25 mg by mouth at bedtime as needed for nausea or vomiting. For nausea and sleep  . rosuvastatin (CRESTOR) 20 MG tablet TAKE 1 TABLET (20 MG TOTAL) BY MOUTH DAILY. NEEDS  TO APPT FOR FUTURE REFILLS  . sertraline (ZOLOFT) 100 MG tablet Take 100 mg by mouth every morning.  . VENTOLIN HFA 108 (90 Base) MCG/ACT inhaler INHALE 2 PUFFS INTO THE LUNGS EVERY 6 (SIX) HOURS AS NEEDED FOR WHEEZING OR SHORTNESS OF BREATH.  . [DISCONTINUED] clopidogrel (PLAVIX) 75 MG tablet TAKE 1 TABLET (75 MG TOTAL) BY MOUTH DAILY. PLEASE SCHEDULE APPOINTMENT FOR REFILLS. 2ND ATTEMPT.  . [DISCONTINUED] diltiazem (CARDIZEM CD) 120 MG 24 hr capsule TAKE 1 CAPSULE (120 MG TOTAL) BY MOUTH DAILY. NEED OV.  . [DISCONTINUED] DULoxetine (CYMBALTA) 30 MG capsule TAKE 2 CAPSULES (60 MG TOTAL) BY MOUTH EVERY MORNING.    Allergies  Allergen Reactions  . Codeine Nausea And Vomiting  . Erythromycin Nausea And Vomiting  . Hydromorphone Hcl Nausea And Vomiting  . Morphine And Related Nausea And Vomiting  . Nalbuphine Nausea And Vomiting  . Niaspan [Niacin Er] Other (See Comments)    Mouth blisters  . Sulfa Antibiotics Nausea Only and Other (See Comments)    "Tears up stomach"  . Sulfonamide Derivatives Other (See Comments)    Reaction: per patient "tears her stomach up"  . Tetracycline Hives  . Tape Rash    No "plastic" tape," please    Social History   Social History  . Marital status: Married    Spouse name: N/A  . Number of children: N/A  . Years of education: N/A   Social History Main Topics  . Smoking status: Former Smoker    Packs/day: 1.00    Years: 30.00    Types: Cigarettes    Quit date: 08/17/2002  . Smokeless tobacco: Never Used  . Alcohol use No  . Drug use:  No  . Sexual activity: Not Asked   Other Topics Concern  . None   Social History Narrative   She is currently married, and the caregiver of her husband who is recovering from surgery for tongue cancer now diagnosed with lung cancer. Prior to his diagnosis of her husband, she actually had adopted a 2-year-old child who she knows caring for as well. With all the surgeries, they have been quite financially troubled.  Thanks the help of her community and church, they have been able to stay "alf[place]."     She is a former smoker who quit in 2004 after a 30-pack-year history.   She is active chasing a 44-year-old child, does not do routine exercise. She's been quite depressed with the condition of her husband, and admits to eating comfort herself.   She does not drink alcohol.  WHEN I LAST SAW HER, SHE HAD ADOPTED A 40-YEAR-OLD BOY WITH SEVERAL SOCIAL ISSUES INCLUDING ADHD  family history includes Arrhythmia in her brother and paternal aunt; Cancer in her father and mother; Heart disease in her father.  Wt Readings from Last 3 Encounters:  01/26/17 206 lb 12.8 oz (93.8 kg)  12/28/16 195 lb 12.8 oz (88.8 kg)  10/30/16 183 lb 3.2 oz (83.1 kg)  - Gain since loosening Lap Band.  PHYSICAL EXAM BP 128/62   Pulse 71   Ht 5\' 1"  (1.549 m)   Wt 206 lb 12.8 oz (93.8 kg)   SpO2 97%   BMI 39.07 kg/m  General appearance: alert, cooperative, appears stated age, no distress. Moderate-morbidly obese. She is definitely heavier than my last saw her, but actually seems to be healthy-appearing HEENT: [place]/AT, EOMI, MMM, anicteric sclera Neck: no adenopathy, no carotid bruit and no JVD Lungs: clear to auscultation bilaterally, normal percussion bilaterally and non-labored Heart: regular rate and rhythm, S1 &S2 normal, - cannot exclude soft S4. no click, rub or gallop; unable to palpate PMI. 2/6 SEM at RUSB. Also very very soft HSM or apex. Abdomen: soft, nondistended; bowel sounds normal; no masses,  no organomegaly; significant truncal obesity. But more tender than usual. Extremities: extremities normal, atraumatic, no cyanosis, and edema trace to 1+ Pulses: 2+ and symmetric;  Skin: mobility and turgor normal, no evidence of bleeding or bruising, no lesions noted, no periungual infections and She has multiple dry scaly seborrheic keratosis-like lesions. Neurologic: Mental status: Alert & oriented x 3, thought content  appropriate; non-focal exam.  Pleasant mood & affect.    Adult ECG Report None  Other studies Reviewed: Additional studies/ records that were reviewed today include:  Recent Labs:   Lab Results  Component Value Date   CREATININE 0.72 10/21/2016   BUN 8 10/21/2016   NA 140 10/21/2016   K 3.4 (L) 10/21/2016   CL 105 10/21/2016   CO2 24 10/21/2016    No results found for: CHOL, HDL, LDLCALC, LDLDIRECT, TRIG, CHOLHDL   ASSESSMENT / PLAN: Problem List Items Addressed This Visit    Aortic valve sclerosis (Chronic)    Aortic sclerosis with suggestion of possible potential bicuspid aortic valve on last echo. No sign of stenosis however. There is also some mitral and medication. these are murmurs are heard on exam, but are not significant and not worrisome. i would probably recheck echocardiogram may be 2-3 years out from the most recent one which would put it 2019-2020.      Relevant Medications   diltiazem (CARDIZEM CD) 120 MG 24 hr capsule   CAD S/P percutaneous coronary  angioplasty - PCI x 5 to SVG-D1 (Chronic)    No recurrent anginal symptoms. Remains on Plavix for her multiple stents. She continues to be on calcium channel blocker and beta blocker along with statin. No ARB or ACE inhibitor as her blood pressure has historically not been high enough to consider adding these medications. The beta blocker and calcium accommodation is ostensibly for her history of A. fib.      Relevant Medications   diltiazem (CARDIZEM CD) 120 MG 24 hr capsule   Dyslipidemia, goal LDL below 70 (Chronic)    Remains on statin. Labs are followed by PCP      Relevant Medications   diltiazem (CARDIZEM CD) 120 MG 24 hr capsule   Hypertension associated with diabetes ([place]) (Chronic)    Well-controlled with calcium channel blocker and beta blocker.      Relevant Medications   diltiazem (CARDIZEM CD) 120 MG 24 hr capsule   Morbid obesity - s/p Lap Band 9/'05 (Chronic)    Partly because of all the  procedures she has had done with her lap band, she is chosen not to be on full anticoagulation. I think she just may have to tolerate being slightly heavier than she wants to be because she seems healthier at this weight than she did when she was lower. The constant vomiting that occur with her tighten (was probably the reason she gained weight simply she didn't eat. In this setting she was just feeling poorly).  This is a source of significant depression for her however.      PAF (paroxysmal atrial fibrillation) (HCC) (Chronic)    So far no recurrence noted since 2015. Dirt long talk several years ago, we decided to simply continue with Plavix alone unless she had recurrent symptoms. She was certainly be quite high risk and would warrant full anticoagulation, however for now, especially with her planned surgery coming we will hold off on full anticoagulation -> however we can discuss the potential for ELIQUIS. Simply did not have the time during this visit.      Relevant Medications   diltiazem (CARDIZEM CD) 120 MG 24 hr capsule   Preoperative cardiovascular examination - Primary    Nissa is a very complicated patient and his very difficult to figure out all of her conditions. She is having history of TIA in the past, but has been stable. The surgery that his plan is in is intraperitoneal which by definition would likely be high risk, especially if it is planning to be open (if not open it is probably still intermediate risk. She has renal disease, but her Creatinine is relatively stable. She has diabetes on insulin.  She does have history of CAD,  however she has been revascularized, and is nonischemic by recent stress test or by symptoms. she does not have any heart failure symptoms with a relatively normal echocardiogram. 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  PREOPERATIVE CARDIAC RISK ASSESSMENT   Revised Cardiac Risk  Index:  High Risk Surgery: If open -HIGH; if Laparoscopic - LOW  Defined as Intraperitoneal, intrathoracic or suprainguinal vascular  Active CAD: no; NO ACTIVE SYMPTOMS (recent negative Myoview)  CHF: no; recent Echo - essentially normal  Cerebrovascular Disease: yes; h/o TIA  Diabetes: yes; On Insulin: yes  CKD (Cr >~ 2): no; Cr 0.72  Total: 3 Estimated Risk of Adverse Outcome: HIGH (due to factors that cannot be adjusted)  Estimated Risk of MI, PE,  VF/VT (Cardiac Arrest), Complete Heart Block: ~11 % - reduced to < 6.6% due to long term BB use.   ACC/AHA Guidelines for "Clearance":  Step 1 - Need for Emergency Surgery: No: not Emergent.  If Yes - go straight to OR with perioperative surveillance  Step 2 - Active Cardiac Conditions (Unstable Angina, Decompensated HF, Significant  Arrhytmias - Complete HB, Mobitz II, Symptomatic VT or SVT, Severe Aortic Stenosis - mean gradient > 40 mmHg, Valve area < 1.0 cm2):   No:   If Yes - Evaluate & Treat per ACC/AHA Guidelines  Step 3 -  Low Risk Surgery: No: at least Moderate  If Yes --> proceed to OR  If No --> Step 4  Step 4 - Functional Capacity >= 4 METS without symptoms: Yes - more limited by OA/RA symptoms, but is able to do > 4 METS.  If Yes --> proceed to OR  If No --> Step 5  Step 5 --  Clinical Risk Factors (CRF)   3 or more: Yes  If Yes -- assess Surgical Risk, --   (High Risk Non-cardiac), Intraabdominal or thoracic vascular surgery consider testing if it will change management.  Intermediate Risk: Proceed to OR with HR control, or consider testing if it will change management  She has had recent evaluation with Echo & Myoview - both essentially normal & has not had any persistent Sx since --> would not re-assess simply for pre-op purposes as it would not alter current Rx plan.  We cannot reduce risk with CV evauation.  Recommend proceeding to the OR for surgery without further Cardiac Evaluation (may  benefit from having local Cardiology team available peri-operative given her extensive history of CAD & Afib. - If NPO for > 1 day, would Rx IV BB to avoid rebound HTN/Tachycardia (& potentially induce Afib). OK to hold Plavix 5-7 days pre-op.       ST elevation myocardial infarction (STEMI) of inferoposterior wall; H/o -- Rescue PTCA of RCA -- referred for CABG (Chronic)    Thankfully, no recurrent anginal symptoms or heart failure symptoms. She has had multiple liver PCI to vein grafts most recently in January 2015. No active anginal symptoms now. Low risk Myoview August 2017. Also echo was relatively stable in 2017.  On stable regimen.      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 Probably going to follow-up carotid Dopplers again in 2019.      Relevant Medications   diltiazem (CARDIZEM CD) 120 MG 24 hr capsule     Very difficult, complicated clinic visit where adequate cover close to 2 years worth of events and studies/procedures performed. Extensive chart review and the patient has multiple symptoms. In addition to her chronic conditions, we also had a discuss perioperative evaluation. Close to 55 minutes was spent with the patient. > 50% of the time was spent in direct consultation and discussing her symptoms and recommendations for course of action.  Current medicines are reviewed at length with the patient today. (+/- concerns) n/a The following changes have been made: n/a  Patient Instructions  No change with current medications   Cardiac clearance for surgery -- inter mediate-high risk for surgery  No additional cardiac evaluation needed  Your physician wants you to follow-up in 6 months with [name].You will receive a reminder letter in  the mail two months in advance. If you don't receive a letter, please  call our office to schedule the follow-up appointment.   If you need a refill on your cardiac medications before your next appointment, please call your pharmacy.    Studies Ordered:   No orders of the defined types were placed in this encounter.     [name], M.D., M.S. Interventional Cardiologist   Pager # [phone] Phone # [phone] [address] [place], [place] [address]

## 2017-01-27 ENCOUNTER — Other Ambulatory Visit: Payer: Self-pay | Admitting: Physical Medicine & Rehabilitation

## 2017-01-27 DIAGNOSIS — E1142 Type 2 diabetes mellitus with diabetic polyneuropathy: Secondary | ICD-10-CM

## 2017-01-27 DIAGNOSIS — M797 Fibromyalgia: Secondary | ICD-10-CM

## 2017-01-28 ENCOUNTER — Encounter: Payer: Self-pay | Admitting: Cardiology

## 2017-01-28 DIAGNOSIS — R102 Pelvic and perineal pain: Secondary | ICD-10-CM | POA: Diagnosis not present

## 2017-01-28 DIAGNOSIS — I358 Other nonrheumatic aortic valve disorders: Secondary | ICD-10-CM | POA: Insufficient documentation

## 2017-01-28 DIAGNOSIS — Z01810 Encounter for preprocedural cardiovascular examination: Secondary | ICD-10-CM | POA: Insufficient documentation

## 2017-01-28 DIAGNOSIS — N83209 Unspecified ovarian cyst, unspecified side: Secondary | ICD-10-CM | POA: Diagnosis not present

## 2017-01-28 DIAGNOSIS — I350 Nonrheumatic aortic (valve) stenosis: Secondary | ICD-10-CM | POA: Insufficient documentation

## 2017-01-28 NOTE — Assessment & Plan Note (Signed)
Partly because of all the procedures she has had done with her lap band, she is chosen not to be on full anticoagulation. I think she just may have to tolerate being slightly heavier than she wants to be because she seems healthier at this weight than she did when she was lower. The constant vomiting that occur with her tighten (was probably the reason she gained weight simply she didn't eat. In this setting she was just feeling poorly).  This is a source of significant depression for her however.

## 2017-01-28 NOTE — Assessment & Plan Note (Signed)
Well-controlled with calcium channel blocker and beta blocker.

## 2017-01-28 NOTE — Assessment & Plan Note (Signed)
Remains on statin. Labs are followed by PCP

## 2017-01-28 NOTE — Assessment & Plan Note (Signed)
Thankfully, no recurrent anginal symptoms or heart failure symptoms. She has had multiple liver PCI to vein grafts most recently in January 2015. No active anginal symptoms now. Low risk Myoview August 2017. Also echo was relatively stable in 2017.  On stable regimen.

## 2017-01-28 NOTE — Assessment & Plan Note (Signed)
Katherine [name] is a very complicated patient and his very difficult to figure out all of her conditions. She is having history of TIA in the past, but has been stable. The surgery that his plan is in is intraperitoneal which by definition would likely be high risk, especially if it is planning to be open (if not open it is probably still intermediate risk. She has renal disease, but her Creatinine is relatively stable. She has diabetes on insulin.  She does have history of CAD,  however she has been revascularized, and is nonischemic by recent stress test or by symptoms. she does not have any heart failure symptoms with a relatively normal echocardiogram. 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  PREOPERATIVE CARDIAC RISK ASSESSMENT   Revised Cardiac Risk Index:  High Risk Surgery: If open -HIGH; if Laparoscopic - LOW  Defined as Intraperitoneal, intrathoracic or suprainguinal vascular  Active CAD: no; NO ACTIVE SYMPTOMS (recent negative Myoview)  CHF: no; recent Echo - essentially normal  Cerebrovascular Disease: yes; h/o TIA  Diabetes: yes; On Insulin: yes  CKD (Cr >~ 2): no; Cr 0.72  Total: 3 Estimated Risk of Adverse Outcome: HIGH (due to factors that cannot be adjusted)  Estimated Risk of MI, PE, VF/VT (Cardiac Arrest), Complete Heart Block: ~11 % - reduced to < 6.6% due to long term BB use.   ACC/AHA Guidelines for "Clearance":  Step 1 - Need for Emergency Surgery: No: not Emergent.  If Yes - go straight to OR with perioperative surveillance  Step 2 - Active Cardiac Conditions (Unstable Angina, Decompensated HF, Significant  Arrhytmias - Complete HB, Mobitz II, Symptomatic VT or SVT, Severe Aortic Stenosis - mean gradient > 40 mmHg, Valve area < 1.0 cm2):   No:   If Yes - Evaluate & Treat per ACC/AHA Guidelines  Step 3 -  Low Risk Surgery: No: at least Moderate  If Yes --> proceed to OR  If No --> Step 4  Step 4 -  Functional Capacity >= 4 METS without symptoms: Yes - more limited by OA/RA symptoms, but is able to do > 4 METS.  If Yes --> proceed to OR  If No --> Step 5  Step 5 --  Clinical Risk Factors (CRF)   3 or more: Yes  If Yes -- assess Surgical Risk, --   (High Risk Non-cardiac), Intraabdominal or thoracic vascular surgery consider testing if it will change management.  Intermediate Risk: Proceed to OR with HR control, or consider testing if it will change management  She has had recent evaluation with Echo & Myoview - both essentially normal & has not had any persistent Sx since --> would not re-assess simply for pre-op purposes as it would not alter current Rx plan.  We cannot reduce risk with CV evauation.  Recommend proceeding to the OR for surgery without further Cardiac Evaluation (may benefit from having local Cardiology team available peri-operative given her extensive history of CAD & Afib. - If NPO for > 1 day, would Rx IV BB to avoid rebound HTN/Tachycardia (& potentially induce Afib). OK to hold Plavix 5-7 days pre-op.

## 2017-01-28 NOTE — Assessment & Plan Note (Signed)
Aortic sclerosis with suggestion of possible potential bicuspid aortic valve on last echo. No sign of stenosis however. There is also some mitral and medication. these are murmurs are heard on exam, but are not significant and not worrisome. i would probably recheck echocardiogram may be 2-3 years out from the most recent one which would put it 2019-2020.

## 2017-01-28 NOTE — Assessment & Plan Note (Signed)
She had follow-up studies done last year which were relatively normal. Probably going to follow-up carotid Dopplers again in 2019.

## 2017-01-28 NOTE — Assessment & Plan Note (Signed)
So far no recurrence noted since 2015. Dirt long talk several years ago, we decided to simply continue with Plavix alone unless she had recurrent symptoms. She was certainly be quite high risk and would warrant full anticoagulation, however for now, especially with her planned surgery coming we will hold off on full anticoagulation -> however we can discuss the potential for ELIQUIS. Simply did not have the time during this visit.

## 2017-01-28 NOTE — Assessment & Plan Note (Signed)
No recurrent anginal symptoms. Remains on Plavix for her multiple stents. She continues to be on calcium channel blocker and beta blocker along with statin. No ARB or ACE inhibitor as her blood pressure has historically not been high enough to consider adding these medications. The beta blocker and calcium accommodation is ostensibly for her history of A. fib.

## 2017-01-30 ENCOUNTER — Other Ambulatory Visit: Payer: Self-pay | Admitting: Cardiology

## 2017-02-18 IMAGING — DX DG CHEST 2V
2 series · 2 of 2 positions shown · non-contrast
Comparison: 10/22/2014.

CLINICAL DATA: Shortness of breath.  Cough.

EXAM:
CHEST  2 VIEW

[chest pa]
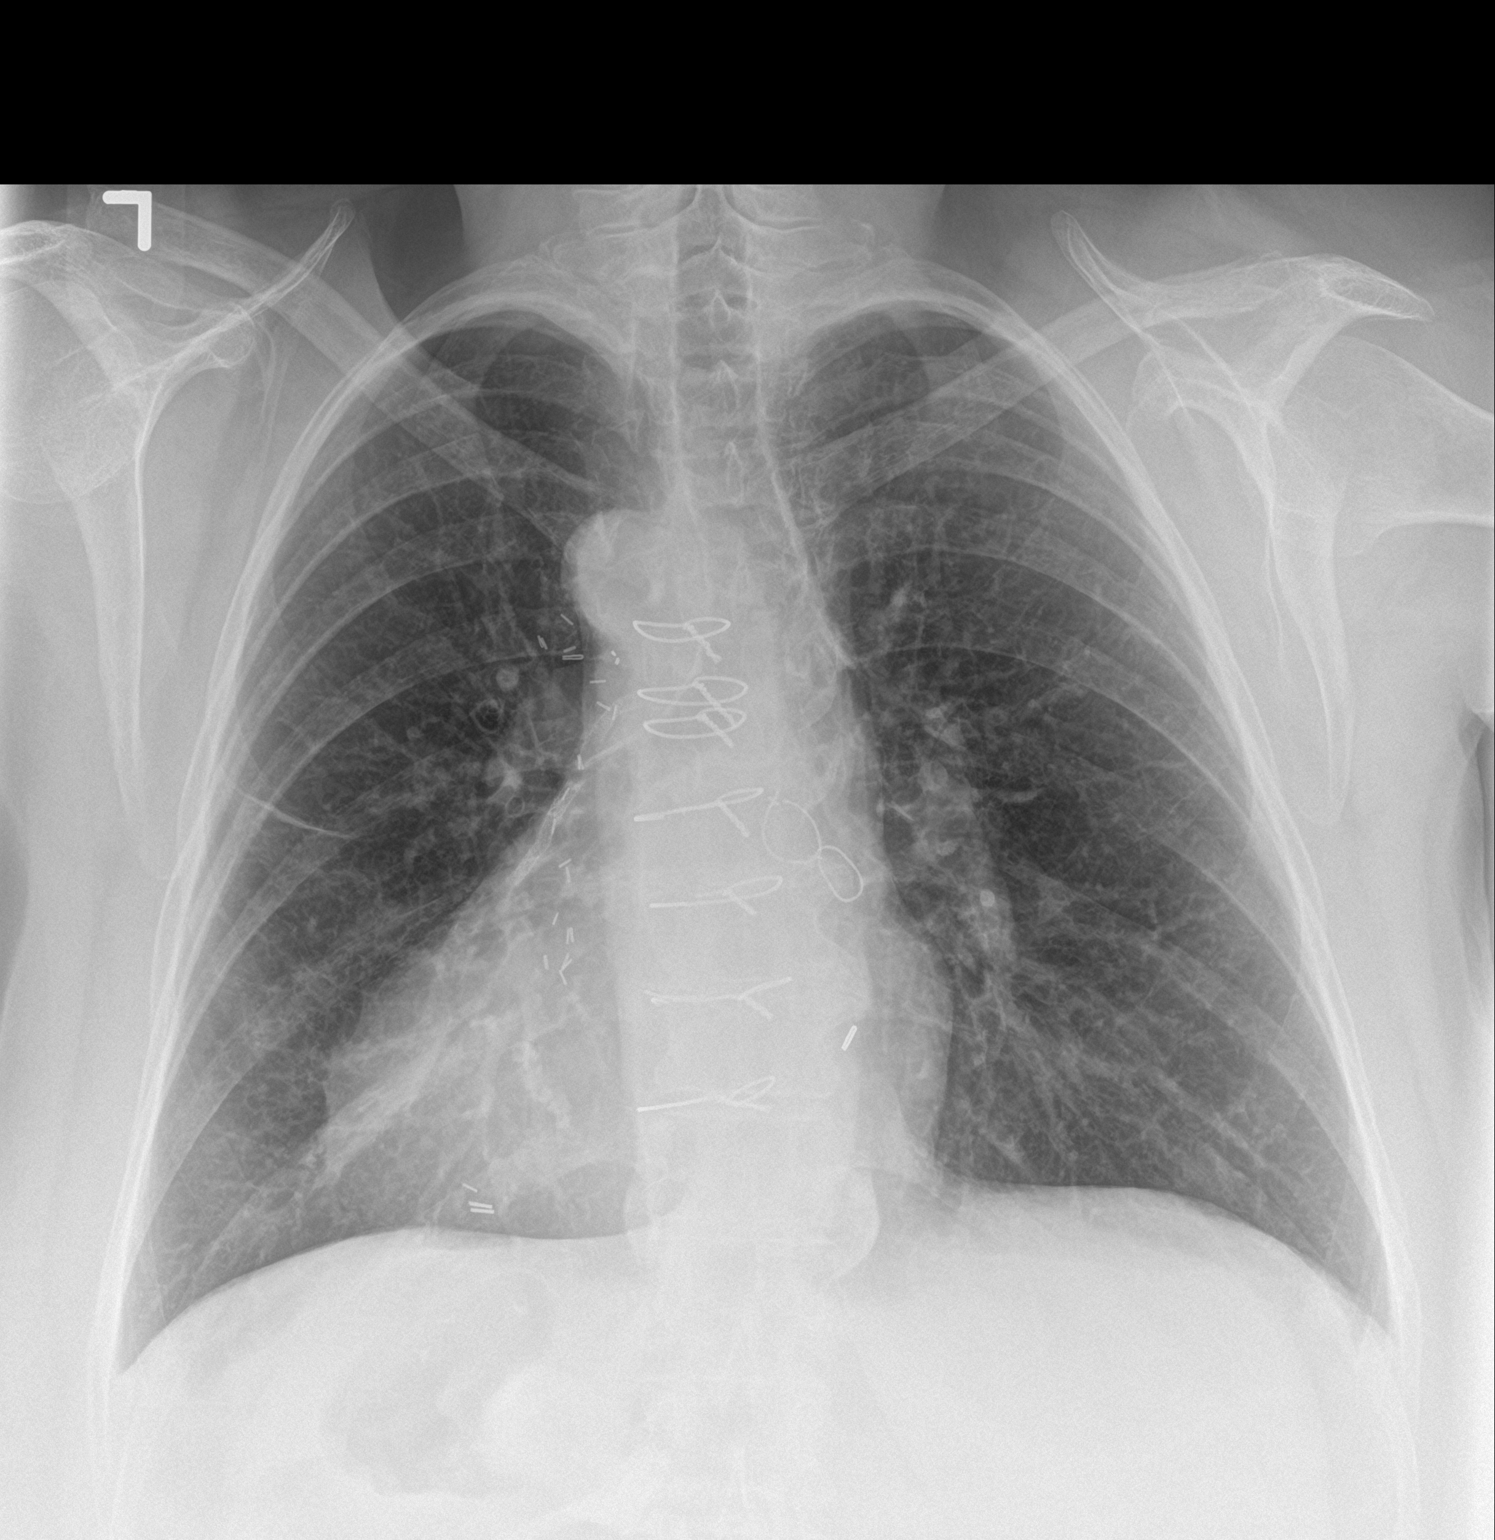

[chest lat]
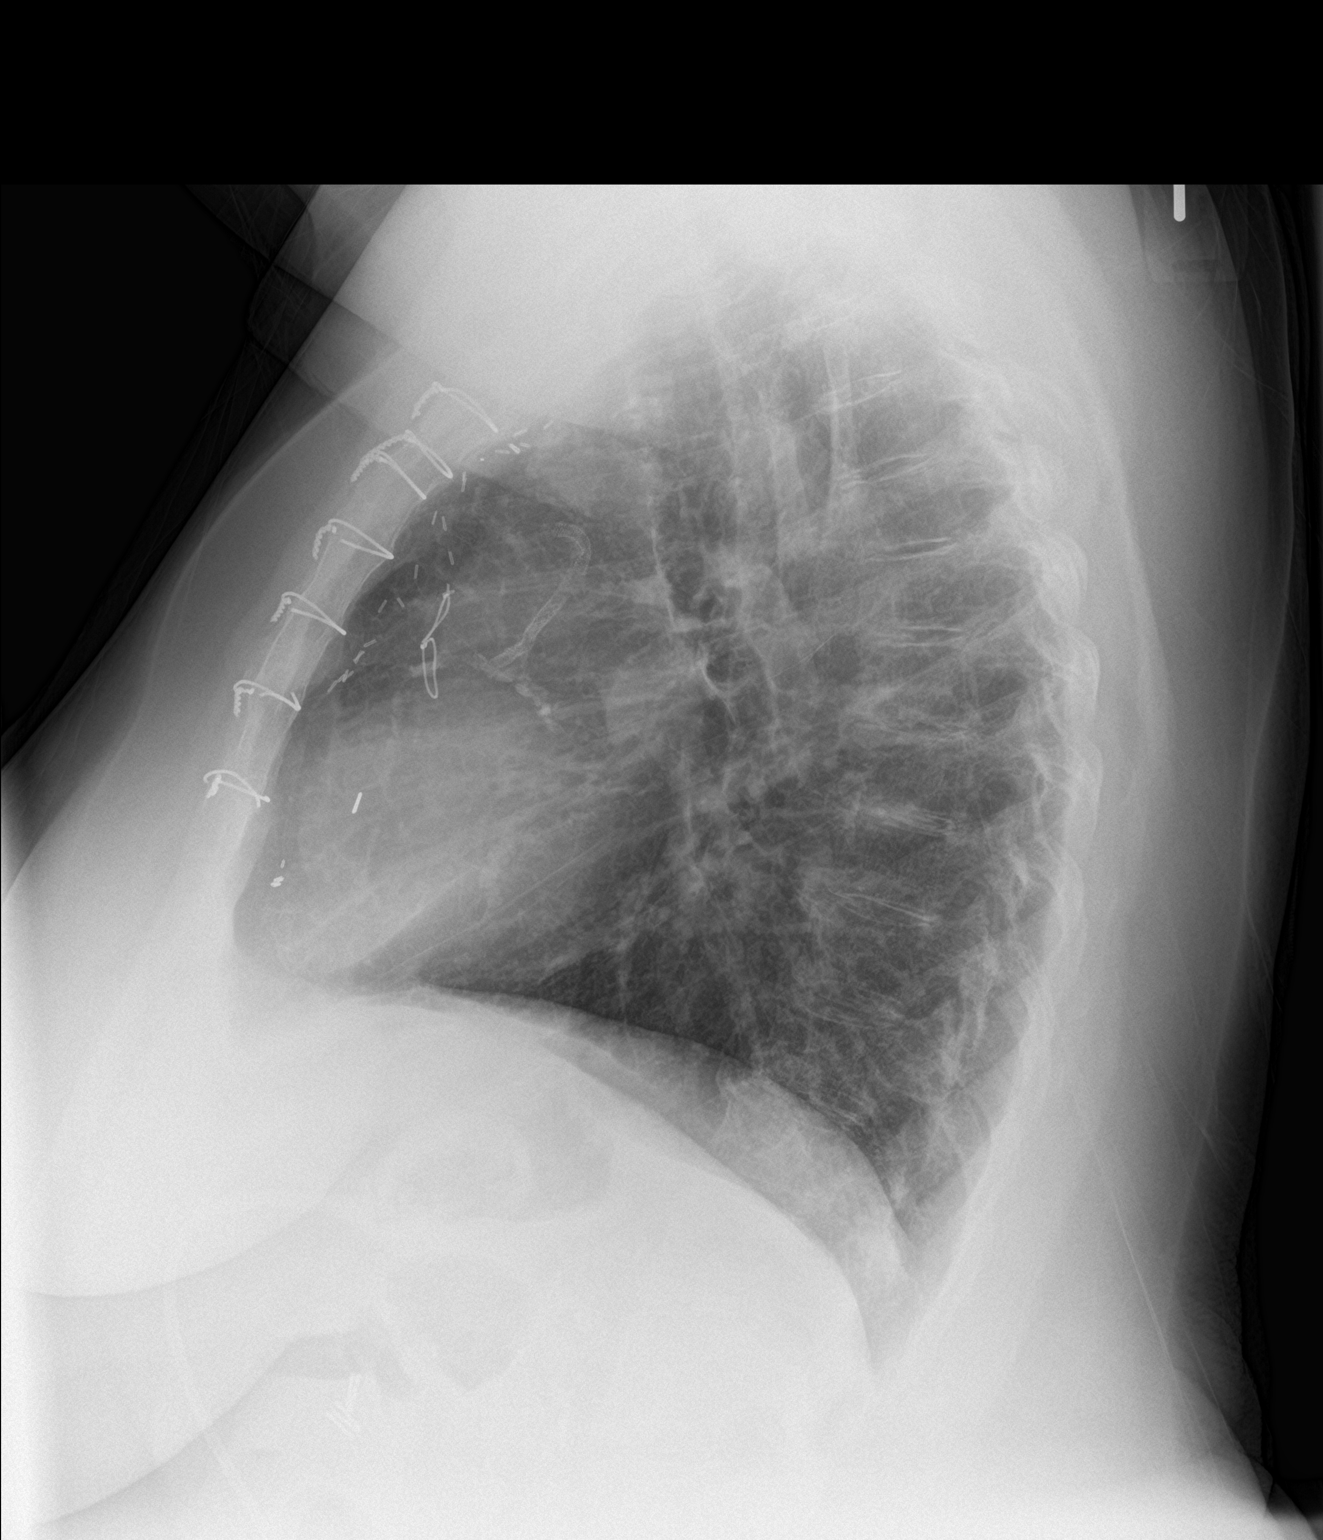

[2 of 2 positions shown; findings below may reference images not displayed]

FINDINGS: Prior CABG. Mild cardiomegaly with mild pulmonary vascular
prominence and interstitial prominence. A mild component congestive
heart failure cannot be excluded. Mild interstitial pneumonitis
cannot be excluded. No pleural effusion or pneumothorax. Carotid
vascular calcification .
IMPRESSION: 1. Prior CABG. Cardiomegaly with mild pulmonary vascular prominence
and interstitial prominence consistent mild congestive heart
failure. Mild interstitial pneumonitis cannot be excluded.

2.  Carotid vascular disease.

## 2017-02-21 ENCOUNTER — Other Ambulatory Visit: Payer: Self-pay | Admitting: Cardiology

## 2017-02-22 ENCOUNTER — Other Ambulatory Visit: Payer: Self-pay | Admitting: Emergency Medicine

## 2017-03-09 ENCOUNTER — Ambulatory Visit: Payer: Medicare Other | Admitting: Registered Nurse

## 2017-03-10 ENCOUNTER — Other Ambulatory Visit: Payer: Self-pay | Admitting: Physical Medicine & Rehabilitation

## 2017-03-10 DIAGNOSIS — M797 Fibromyalgia: Secondary | ICD-10-CM

## 2017-03-10 DIAGNOSIS — M47816 Spondylosis without myelopathy or radiculopathy, lumbar region: Secondary | ICD-10-CM

## 2017-03-11 DIAGNOSIS — R1900 Intra-abdominal and pelvic swelling, mass and lump, unspecified site: Secondary | ICD-10-CM | POA: Diagnosis not present

## 2017-03-11 DIAGNOSIS — N83201 Unspecified ovarian cyst, right side: Secondary | ICD-10-CM | POA: Diagnosis not present

## 2017-03-14 ENCOUNTER — Other Ambulatory Visit: Payer: Self-pay | Admitting: Physician Assistant

## 2017-03-15 ENCOUNTER — Encounter: Payer: Medicare Other | Admitting: Registered Nurse

## 2017-03-24 IMAGING — NM NM MISC PROCEDURE
3 series · 18 of 18 positions shown · non-contrast
Comparison: none

[Series 1: wbr_r-proj_st rest_(id)_sa · 6.5mm · 6.51mm/px · 6 of 64 frames shown]
[frame 6/64]
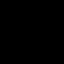
[frame 16/64]
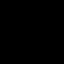
[frame 27/64]
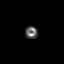
[frame 38/64]
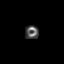
[frame 48/64]
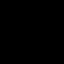
[frame 59/64]
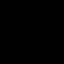

[Series 1: wbr_s-proj_st stress_(id)_sa · 6.5mm · 6.51mm/px · 6 of 64 frames shown (1 of 2)]
[frame 6/64]
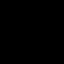
[frame 16/64]
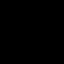
[frame 27/64]
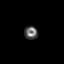
[frame 38/64]
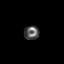
[frame 48/64]
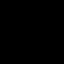
[frame 59/64]
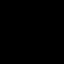

[Series 1: wbr_s-proj_st stress_(id)_sa · 6.5mm · 6.51mm/px · 6 of 512 frames shown (2 of 2)]
[frame 43/512]
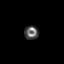
[frame 128/512]
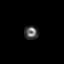
[frame 214/512]
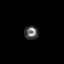
[frame 299/512]
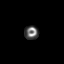
[frame 384/512]
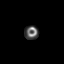
[frame 470/512]
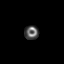

[18 of 18 positions shown; findings below may reference images not displayed]

Canned report from images found in remote index.

Refer to host system for actual result text.

## 2017-03-26 DIAGNOSIS — N2581 Secondary hyperparathyroidism of renal origin: Secondary | ICD-10-CM | POA: Diagnosis not present

## 2017-03-26 DIAGNOSIS — I739 Peripheral vascular disease, unspecified: Secondary | ICD-10-CM | POA: Diagnosis not present

## 2017-03-26 DIAGNOSIS — Z940 Kidney transplant status: Secondary | ICD-10-CM | POA: Diagnosis not present

## 2017-03-26 DIAGNOSIS — D631 Anemia in chronic kidney disease: Secondary | ICD-10-CM | POA: Diagnosis not present

## 2017-03-26 DIAGNOSIS — I2510 Atherosclerotic heart disease of native coronary artery without angina pectoris: Secondary | ICD-10-CM | POA: Diagnosis not present

## 2017-03-26 DIAGNOSIS — N182 Chronic kidney disease, stage 2 (mild): Secondary | ICD-10-CM | POA: Diagnosis not present

## 2017-03-26 DIAGNOSIS — G4733 Obstructive sleep apnea (adult) (pediatric): Secondary | ICD-10-CM | POA: Diagnosis not present

## 2017-03-26 DIAGNOSIS — I639 Cerebral infarction, unspecified: Secondary | ICD-10-CM | POA: Diagnosis not present

## 2017-03-26 DIAGNOSIS — E118 Type 2 diabetes mellitus with unspecified complications: Secondary | ICD-10-CM | POA: Diagnosis not present

## 2017-03-26 DIAGNOSIS — E669 Obesity, unspecified: Secondary | ICD-10-CM | POA: Diagnosis not present

## 2017-03-26 DIAGNOSIS — I129 Hypertensive chronic kidney disease with stage 1 through stage 4 chronic kidney disease, or unspecified chronic kidney disease: Secondary | ICD-10-CM | POA: Diagnosis not present

## 2017-03-26 DIAGNOSIS — E785 Hyperlipidemia, unspecified: Secondary | ICD-10-CM | POA: Diagnosis not present

## 2017-03-28 ENCOUNTER — Other Ambulatory Visit: Payer: Self-pay | Admitting: Emergency Medicine

## 2017-03-29 ENCOUNTER — Encounter: Payer: Medicare Other | Attending: Registered Nurse | Admitting: Registered Nurse

## 2017-03-29 ENCOUNTER — Telehealth: Payer: Self-pay | Admitting: Registered Nurse

## 2017-03-29 ENCOUNTER — Encounter: Payer: Self-pay | Admitting: Registered Nurse

## 2017-03-29 ENCOUNTER — Other Ambulatory Visit: Payer: Self-pay | Admitting: Nephrology

## 2017-03-29 VITALS — BP 155/87 | HR 82

## 2017-03-29 DIAGNOSIS — E669 Obesity, unspecified: Secondary | ICD-10-CM | POA: Diagnosis not present

## 2017-03-29 DIAGNOSIS — Z9851 Tubal ligation status: Secondary | ICD-10-CM | POA: Diagnosis not present

## 2017-03-29 DIAGNOSIS — Z8614 Personal history of Methicillin resistant Staphylococcus aureus infection: Secondary | ICD-10-CM | POA: Insufficient documentation

## 2017-03-29 DIAGNOSIS — Z951 Presence of aortocoronary bypass graft: Secondary | ICD-10-CM | POA: Insufficient documentation

## 2017-03-29 DIAGNOSIS — M25552 Pain in left hip: Secondary | ICD-10-CM

## 2017-03-29 DIAGNOSIS — M7581 Other shoulder lesions, right shoulder: Secondary | ICD-10-CM

## 2017-03-29 DIAGNOSIS — Z9861 Coronary angioplasty status: Secondary | ICD-10-CM | POA: Diagnosis not present

## 2017-03-29 DIAGNOSIS — I252 Old myocardial infarction: Secondary | ICD-10-CM | POA: Diagnosis not present

## 2017-03-29 DIAGNOSIS — J449 Chronic obstructive pulmonary disease, unspecified: Secondary | ICD-10-CM | POA: Insufficient documentation

## 2017-03-29 DIAGNOSIS — N186 End stage renal disease: Secondary | ICD-10-CM | POA: Diagnosis not present

## 2017-03-29 DIAGNOSIS — K219 Gastro-esophageal reflux disease without esophagitis: Secondary | ICD-10-CM | POA: Diagnosis not present

## 2017-03-29 DIAGNOSIS — Z9884 Bariatric surgery status: Secondary | ICD-10-CM | POA: Diagnosis not present

## 2017-03-29 DIAGNOSIS — Z7952 Long term (current) use of systemic steroids: Secondary | ICD-10-CM | POA: Insufficient documentation

## 2017-03-29 DIAGNOSIS — E1142 Type 2 diabetes mellitus with diabetic polyneuropathy: Secondary | ICD-10-CM

## 2017-03-29 DIAGNOSIS — M069 Rheumatoid arthritis, unspecified: Secondary | ICD-10-CM | POA: Diagnosis not present

## 2017-03-29 DIAGNOSIS — E1122 Type 2 diabetes mellitus with diabetic chronic kidney disease: Secondary | ICD-10-CM | POA: Diagnosis not present

## 2017-03-29 DIAGNOSIS — I2510 Atherosclerotic heart disease of native coronary artery without angina pectoris: Secondary | ICD-10-CM | POA: Diagnosis not present

## 2017-03-29 DIAGNOSIS — I6523 Occlusion and stenosis of bilateral carotid arteries: Secondary | ICD-10-CM | POA: Diagnosis not present

## 2017-03-29 DIAGNOSIS — M47816 Spondylosis without myelopathy or radiculopathy, lumbar region: Secondary | ICD-10-CM | POA: Diagnosis not present

## 2017-03-29 DIAGNOSIS — M542 Cervicalgia: Secondary | ICD-10-CM

## 2017-03-29 DIAGNOSIS — M25551 Pain in right hip: Secondary | ICD-10-CM | POA: Diagnosis not present

## 2017-03-29 DIAGNOSIS — Z940 Kidney transplant status: Secondary | ICD-10-CM | POA: Insufficient documentation

## 2017-03-29 DIAGNOSIS — I480 Paroxysmal atrial fibrillation: Secondary | ICD-10-CM | POA: Insufficient documentation

## 2017-03-29 DIAGNOSIS — Z8249 Family history of ischemic heart disease and other diseases of the circulatory system: Secondary | ICD-10-CM | POA: Insufficient documentation

## 2017-03-29 DIAGNOSIS — Z8673 Personal history of transient ischemic attack (TIA), and cerebral infarction without residual deficits: Secondary | ICD-10-CM | POA: Diagnosis not present

## 2017-03-29 DIAGNOSIS — F329 Major depressive disorder, single episode, unspecified: Secondary | ICD-10-CM | POA: Diagnosis not present

## 2017-03-29 DIAGNOSIS — Z87891 Personal history of nicotine dependence: Secondary | ICD-10-CM | POA: Diagnosis not present

## 2017-03-29 DIAGNOSIS — G4733 Obstructive sleep apnea (adult) (pediatric): Secondary | ICD-10-CM | POA: Insufficient documentation

## 2017-03-29 DIAGNOSIS — M797 Fibromyalgia: Secondary | ICD-10-CM | POA: Diagnosis not present

## 2017-03-29 DIAGNOSIS — Z955 Presence of coronary angioplasty implant and graft: Secondary | ICD-10-CM | POA: Diagnosis not present

## 2017-03-29 DIAGNOSIS — Z9049 Acquired absence of other specified parts of digestive tract: Secondary | ICD-10-CM | POA: Insufficient documentation

## 2017-03-29 DIAGNOSIS — Z992 Dependence on renal dialysis: Secondary | ICD-10-CM | POA: Insufficient documentation

## 2017-03-29 DIAGNOSIS — G894 Chronic pain syndrome: Secondary | ICD-10-CM | POA: Diagnosis not present

## 2017-03-29 DIAGNOSIS — E785 Hyperlipidemia, unspecified: Secondary | ICD-10-CM | POA: Insufficient documentation

## 2017-03-29 DIAGNOSIS — I120 Hypertensive chronic kidney disease with stage 5 chronic kidney disease or end stage renal disease: Secondary | ICD-10-CM | POA: Diagnosis not present

## 2017-03-29 DIAGNOSIS — D631 Anemia in chronic kidney disease: Secondary | ICD-10-CM | POA: Diagnosis not present

## 2017-03-29 DIAGNOSIS — Z800 Family history of malignant neoplasm of digestive organs: Secondary | ICD-10-CM | POA: Insufficient documentation

## 2017-03-29 DIAGNOSIS — Z808 Family history of malignant neoplasm of other organs or systems: Secondary | ICD-10-CM | POA: Insufficient documentation

## 2017-03-29 MED ORDER — OXYCODONE HCL 5 MG PO TABS: 5.0000 mg | ORAL_TABLET | Freq: Three times a day (TID) | ORAL | 0 refills | Status: DC | PRN

## 2017-03-29 NOTE — Telephone Encounter (Signed)
On 03/29/2017 the [place] was reviewed no conflict was seen on the [facility] with multiple prescribers.  Katherine [name] has a signed narcotic contract with our office. If there were any discrepancies this would have been reported to her physician.

## 2017-03-29 NOTE — Progress Notes (Signed)
Subjective:    Patient ID: Katherine [name], female    DOB: 1956-03-28, 62 y.o.   MRN: [id number]  HPI: Katherine [name] is a 61 year old female who returns for follow up appointment for chronic pain and medication refill. She states her pain is located in her neck radiating into her bilateral shoulder, mid-lower back and bilateral hips R>L. Also states she has a headache today. She rates her pain 8. Her current exercise regime is walking and performing stretching exercises.   Katherine [name] states on July 13th she was at the [facility] office with her husband, she was preparing to sit down when her right leg gave out and she hit her head on a shelf. The office staff helped her up and she was evaluated by her husband's Physician. Her Nephroloigist ( Dr. Jimmy Footman) ordered a CT scan she states on her  Head, Neck and Bilateral Shoulders.   Katherine [name] check suicidal ideation, she admits to being depressed denies suicidal ideation or plan. She admits to being depressed, her brother passed away few months ago and the boy she has been raising for the last 51/2 years was placed in a mental facility. Emotional support given, discussed counseling she agreed. Referral placed to Psychology Dr. Sima Matas, she verbalizes understanding.   She has been diagnosed with Duodenal Adenoma, awaiting surgerty date at [facility].  Last UDS was performed on 11/16/2016, it was consistent. Oral Swab performed today.    Pain Inventory Average Pain 8 Pain Right Now 8 My pain is sharp, burning and aching  In the last 24 hours, has pain interfered with the following? General activity 7 Relation with others 7 Enjoyment of life 7 What TIME of day is your pain at its worst? morning and night Sleep (in general) Fair  Pain is worse with: walking, bending, standing and some activites Pain improves with: rest, heat/ice and medication Relief from Meds: 8  Mobility use a cane how many minutes can you walk? 15 ability  to climb steps?  yes do you drive?  yes  Function disabled: date disabled 16 I need assistance with the following:  household duties and shopping  Neuro/Psych trouble walking spasms depression anxiety suicidal thoughts  Prior Studies Any changes since last visit?  no  Physicians involved in your care Any changes since last visit?  no   Family History  Problem Relation Age of Onset  . Cancer Mother        liver  . Heart disease Father   . Cancer Father        colon  . Arrhythmia Brother        Atrial Fibrillation  . Arrhythmia Paternal Aunt        Atrial Fibrillation   Social History   Social History  . Marital status: Married    Spouse name: N/A  . Number of children: N/A  . Years of education: N/A   Social History Main Topics  . Smoking status: Former Smoker    Packs/day: 1.00    Years: 30.00    Types: Cigarettes    Quit date: 08/17/2002  . Smokeless tobacco: Never Used  . Alcohol use No  . Drug use: No  . Sexual activity: Not Asked   Other Topics Concern  . None   Social History Narrative   She is currently married, and the caregiver of her husband who is recovering from surgery for tongue cancer now diagnosed with lung cancer. Prior to his diagnosis of her  husband, she actually had adopted a 44-year-old child who she knows caring for as well. With all the surgeries, they have been quite financially troubled. Thanks the help of her community and church, they have been able to stay "alfoat."     She is a former smoker who quit in 2004 after a 30-pack-year history.   She is active chasing a 61-year-old child, does not do routine exercise. She's been quite depressed with the condition of her husband, and admits to eating comfort herself.   She does not drink alcohol.   Past Surgical History:  Procedure Laterality Date  . CARDIAC CATHETERIZATION  5/'01, 3/'02, 8/'03, 10/'04; 1/'15   08/22/2013: LAD & RCA 100%; LIMA-LAD & SVG-rPDA patent; Cx-- OM1 60%, OM2  ostial ~50%; SVG-D1 - 80% mid, 50% distal ISR --PCI  . CARDIAC CATHETERIZATION  5/'01, 3/'02, 8/'03, 10/'04; 1/'15   08/22/2013: LAD & RCA 100%; LIMA-LAD & SVG-rPDA patent; Cx-- OM1 60%, OM2 ostial ~50%; SVG-D1 - 80% mid, 50% distal ISR --PCI  . CATHETER REMOVAL    . CHOLECYSTECTOMY N/A 10/29/2014   Procedure: LAPAROSCOPIC CHOLECYSTECTOMY WITH INTRAOPERATIVE CHOLANGIOGRAM;  Surgeon: Excell Seltzer, MD;  Location: [facility] ORS;  Service: General;  Laterality: N/A;  . CORONARY ANGIOPLASTY  1994   x5  . CORONARY ARTERY BYPASS GRAFT  1995   LIMA-LAD, SVG-RPDA, SVG-D1  . ESOPHAGOGASTRODUODENOSCOPY N/A 10/15/2016   Procedure: ESOPHAGOGASTRODUODENOSCOPY (EGD);  Surgeon: Wilford Corner, MD;  Location: [facility] ENDOSCOPY;  Service: Endoscopy;  Laterality: N/A;  . INCISE AND DRAIN ABCESS    . KIDNEY TRANSPLANT  1991  . LAPAROSCOPIC GASTRIC BANDING  04/2004; 10/'09, 2/'10   Port Replacement x 2  . LEFT HEART CATHETERIZATION WITH CORONARY/GRAFT ANGIOGRAM N/A 08/23/2013   Procedure: LEFT HEART CATHETERIZATION WITH Beatrix Fetters;  Surgeon: Wellington Hampshire, MD;  Location: [facility] CATH LAB;  Service: Cardiovascular;  Laterality: N/A;  . Lower Extremity Arterial Dopplers  08/2013   ABI: R 0.96, L 1.04  . MULTIPLE TOOTH EXTRACTIONS  age 55  . NM MYOVIEW LTD  03/2016   EF 62%. LOW RISK. C/W prior MI - no Ischemia. Apical hypokinesis.  [name] Kitchen PERCUTANEOUS CORONARY STENT INTERVENTION (PCI-S)  5/'01, 3/'02, 8/'03, 10/'04;   '01 - S660 BMS 2.5 x 9 - dSVG-D1 into D1; '02- post-stent stenosis - 2.5 x 8 Pixel BMS; '8\03: ISR/Thrombosis into native D1 - AngioJet, 2.5 x 13 Pixel; '04 - ISR 95% - covered stented area with Taxus DES 2.5 mm x 20 (2.88)  . PERCUTANEOUS CORONARY STENT INTERVENTION (PCI-S)  08/22/2013   mid SVG-D1 80%; distal stent ~50% ISR; Promus Prermier DES 2.75 mm xc 20 mm (2.8 mm)  . PERCUTANEOUS CORONARY STENT INTERVENTION (PCI-S)  08/23/2013   Procedure: PERCUTANEOUS CORONARY STENT INTERVENTION (PCI-S);  Surgeon:  Wellington Hampshire, MD;  Location: [facility] CATH LAB;  Service: Cardiovascular;;  . PORT-A-CATH REMOVAL     kidney  . TRANSTHORACIC ECHOCARDIOGRAM  02/2016   EF 55-60%. Septal dyssynergy from CABG GR 2 DD. Aortic valve is trileaflet the functional bicuspid with sclerosis but not yet notable for stenosis stenosis.; no Significant change  . TUBAL LIGATION    . wrist fistula repair Left    dialysis for one year   Past Medical History:  Diagnosis Date  . Anxiety   . Bilateral carotid artery stenosis    Carotid duplex 03/1190: 4-78% LICA, 29-56% RICA, >21% RECA, f/u 1 yr suggested  . CAD (coronary artery disease) of bypass graft 5/01; 3/'02, 8/'03, 10/'04; 1/15   PCI  x 5 to SVG-D1   . CAD in native artery 07/1993    3 Vessel Disease (LAD-D1 & RCA) -- CABG  . CAD S/P percutaneous coronary angioplasty    PCI to SVG-D1 insertion/native D1 x 4 = '01 -(S660 BMS 2.5 x 9 - insertion into D1; '02 - distal overlap ACS Pixel 2.5 x 8  BMS; '03 distal/native ISR/Thrombosis - Pixel 2.5 x 13; '04 - ISR-  Taxus 2.5 x 20 (covered all);; 1/15 - mid SVG-D1 (50% distal ISR) - Promus P 2.75 x 20 -- 2.8 mm  . COPD mixed type ([place])    Followed by Dr. Lamonte Sakai "pulmonologist said no COPD"  . Depression with anxiety   . Diabetes mellitus type 2 in obese ([place])   . Dyslipidemia, goal LDL below 70    08/2012: TC 137, TG 200, HDL 32!, LDL 45; on statin (followed by Dr.Deterding)  . ESRD (end stage renal disease) ([place]) 1991   s/p Cadaveric Renal Transplant [facility] - Dr. Jimmy Footman)   . Family history of adverse reaction to anesthesia    mom's bp dropped during/after anesthesia  . Fibromyalgia   . GERD (gastroesophageal reflux disease)   . Glomerulonephritis, chronic, rapidly progressive 37  . 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 07/1993   Rescue PTCA of RCA -- referred for CABG.  . H/O: GI bleed   . Hypertension associated with diabetes ([place])   . MRSA (methicillin resistant staph aureus)  culture positive   . Obesity    s/p Lap Band 04/2004- Port Replacement 10/09 & 2/10 for infection; Dr. Excell Seltzer.  . OSA (obstructive sleep apnea)    no longer on CPAP or home O2  . PAD (peripheral artery disease) ([place]) 08/2013   LEA Dopplers to be read by Dr. Fletcher Anon  . PAF (paroxysmal atrial fibrillation) ([place]) 06/2014   Noted on CardioNet Monitor  - rate ~112; no recurrent symptoms. Nonionic regulation because of frequent GI procedures. Patient prefers Plavix  . Recurrent boils    Bilateral Groin  . Rheumatoid arthritis ([place])    Per Patient Report; associated with OA  . 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 Servando Snare: LIMA-LAD, SVG-bifurcatingD1, SVG-rPDA  . S/p cadaver renal transplant [place]  . Stroke [facility]) 2012   "right eye stroke- half blind now"  . Unstable angina ([place]) 5/01; 3/'02, 8/'03, 10/'04; 1/15   x 5 occurences since Inf-Post STEMI in 1994   BP (!) 155/87   Pulse 82   SpO2 96%   Opioid Risk Score:   Fall Risk Score:  `1  Depression screen PHQ 2/9  Depression screen [facility] 2/9 03/29/2017 05/19/2016 03/30/2016  Decreased Interest 3 3 3   Down, Depressed, Hopeless 3 3 3   PHQ - 2 Score 6 6 6   Altered sleeping - - 3  Tired, decreased energy - - 3  Change in appetite - - 2  Feeling bad or failure about yourself  - - 3  Trouble concentrating - - 2  Moving slowly or fidgety/restless - - 0  Suicidal thoughts - - 0  PHQ-9 Score - - 19  Some recent data might be hidden   Review of Systems  Constitutional: Positive for chills and unexpected weight change.  HENT: Negative.   Respiratory: Positive for cough, shortness of breath and wheezing.   Gastrointestinal: Positive for abdominal pain and nausea.  Endocrine:       High blood sugars  Genitourinary: Positive for dysuria.  Musculoskeletal: Positive for gait  problem.       Spasms  Skin: Negative.   Allergic/Immunologic: Negative.   Hematological: Bruises/bleeds easily.  Psychiatric/Behavioral: Positive for dysphoric mood and  suicidal ideas. The patient is nervous/anxious.   All other systems reviewed and are negative.      Objective:   Physical Exam  Constitutional: She is oriented to person, place, and time. She appears well-developed and well-nourished.  HENT:  Head: Normocephalic and atraumatic.  Neck: Normal range of motion. Neck supple.  Cardiovascular: Normal rate and regular rhythm.   Pulmonary/Chest: Effort normal and breath sounds normal.  Musculoskeletal:  Normal Muscle Bulk and Muscle Testing Reveals: Upper Extremities: Full ROM and Muscle Strength 5/5 Thoracic Paraspinal Tenderness: T-7-T-9 Lumbar Paraspinal Tenderness: L-3-L-5 Right Greater Trochanteric Tenderness Lower Extremities: Full ROM and Muscle Strength 5/5 Arises from chair with ease using straight cane for support Narrow Based Gait  Neurological: She is alert and oriented to person, place, and time.  Skin: Skin is warm and dry.  Psychiatric: She has a normal mood and affect.  Nursing note and vitals reviewed.         Assessment & Plan:  1. Fibromyalgia: Continue with HEP as tolerated. Continue Tegretol and Cymbalta. 03/29/2017 2. Rheumatoid Arthritis: Refilled: Oxycodone 5mg  one tablet every 8 hours as needed for severe pain #60. Continue to Monitor. 03/29/2017 3. Osteoarthritis--polyarticular: Continue with current medication regime and continue to monitor.03/29/2017 4. ESRD with hx of renal transplant on chronic steroids: Nephrology Following.03/29/2017 5. Right rotator cuff tendonitis/like degenerative/inflammatory changes in the right shoulder also. Continue HEP and Continue to Monitor.03/29/2017 6. Muscle Spasm: Continue Flexeril. 03/29/2017 7. Depression: RX: Referral Dr. Sima Matas 8. Spondylosis of Lumbar Region without Myelopathy or Radiculopathy: Continue HEP as Tolerated. 9. Bilateral Hip Pain: Continue HEP alternate Ice and Heat Therapy.  10. Type 2 DM with Peripheral Neuropathy: Continue Tegretol  20 minutes  of face to face patient care time was spent during this visit. All questions were encouraged and answered.   F/U in 1 month

## 2017-03-30 ENCOUNTER — Other Ambulatory Visit: Payer: Self-pay | Admitting: Nephrology

## 2017-03-30 DIAGNOSIS — F4329 Adjustment disorder with other symptoms: Secondary | ICD-10-CM

## 2017-03-30 DIAGNOSIS — W19XXXD Unspecified fall, subsequent encounter: Secondary | ICD-10-CM

## 2017-04-01 ENCOUNTER — Inpatient Hospital Stay
Admission: RE | Admit: 2017-04-01 | Discharge: 2017-04-01 | Disposition: A | Discharge status: Home / Self Care | Payer: Medicare Other | Source: Ambulatory Visit | Attending: Nephrology | Admitting: Nephrology

## 2017-04-01 ENCOUNTER — Other Ambulatory Visit: Payer: Medicare Other

## 2017-04-05 LAB — DRUG TOX MONITOR 1 W/CONF, ORAL FLD
Amphetamines: NEGATIVE ng/mL (ref <10)
BUPRENORPHINE: NEGATIVE ng/mL (ref <0.025)
Barbiturates: NEGATIVE ng/mL (ref <10)
Benzodiazepines: NEGATIVE ng/mL (ref <0.50)
Cocaine: NEGATIVE ng/mL (ref <2.5)
Codeine: NEGATIVE ng/mL (ref <2.5)
Dihydrocodeine: NEGATIVE ng/mL (ref <2.5)
Fentanyl: NEGATIVE ng/mL (ref <0.10)
HEROIN METABOLITE: NEGATIVE ng/mL (ref <1.0)
HYDROMORPHONE: NEGATIVE ng/mL (ref <2.5)
Hydrocodone: NEGATIVE ng/mL (ref <2.5)
MDMA: NEGATIVE ng/mL (ref <10)
MORPHINE: NEGATIVE ng/mL (ref <2.5)
Marijuana: NEGATIVE ng/mL (ref <2.5)
Meperidine: NEGATIVE ng/mL (ref <5.0)
Meprobamate: NEGATIVE ng/mL (ref <2.5)
Methadone: NEGATIVE ng/mL (ref <5.0)
NICOTINE METABOLITE: NEGATIVE ng/mL (ref <5.0)
NOROXYCODONE: 4.1 ng/mL — AB (ref <2.5)
Norhydrocodone: NEGATIVE ng/mL (ref <2.5)
OXYCODONE: 10.6 ng/mL — AB (ref <2.5)
Opiates: POSITIVE ng/mL — AB (ref <2.5)
Oxymorphone: NEGATIVE ng/mL (ref <2.5)
Phencyclidine: NEGATIVE ng/mL (ref <10)
Propoxyphene: NEGATIVE ng/mL (ref <5.0)
TAPENTADOL: NEGATIVE ng/mL (ref <5.0)
Tramadol: NEGATIVE ng/mL (ref <5.0)
ZOLPIDEM: NEGATIVE ng/mL (ref <5.0)

## 2017-04-05 LAB — DRUG TOX ALC METAB W/CON, ORAL FLD: Alcohol Metabolite: NEGATIVE ng/mL (ref <25)

## 2017-04-06 ENCOUNTER — Ambulatory Visit
Admission: RE | Admit: 2017-04-06 | Discharge: 2017-04-06 | Disposition: A | Discharge status: Home / Self Care | Payer: Medicare Other | Source: Ambulatory Visit | Attending: Nephrology | Admitting: Nephrology

## 2017-04-06 ENCOUNTER — Telehealth: Payer: Self-pay | Admitting: *Deleted

## 2017-04-06 DIAGNOSIS — M542 Cervicalgia: Secondary | ICD-10-CM

## 2017-04-06 DIAGNOSIS — W19XXXD Unspecified fall, subsequent encounter: Secondary | ICD-10-CM

## 2017-04-06 DIAGNOSIS — R296 Repeated falls: Secondary | ICD-10-CM | POA: Diagnosis not present

## 2017-04-06 NOTE — Telephone Encounter (Signed)
Oral swab drug screen was consistent for prescribed medications.  ?

## 2017-04-07 ENCOUNTER — Other Ambulatory Visit: Payer: Self-pay | Admitting: Emergency Medicine

## 2017-04-08 DIAGNOSIS — D132 Benign neoplasm of duodenum: Secondary | ICD-10-CM | POA: Diagnosis not present

## 2017-04-13 ENCOUNTER — Ambulatory Visit: Payer: Medicare Other | Admitting: Emergency Medicine

## 2017-04-14 ENCOUNTER — Other Ambulatory Visit: Payer: Self-pay | Admitting: Physical Medicine & Rehabilitation

## 2017-04-14 DIAGNOSIS — M47816 Spondylosis without myelopathy or radiculopathy, lumbar region: Secondary | ICD-10-CM

## 2017-04-15 IMAGING — CR DG CHEST 2V
2 series · 2 of 2 positions shown · non-contrast
Comparison: 02/05/2016

CLINICAL DATA: Cough, no fever

EXAM:
CHEST  2 VIEW

[w chest pa]
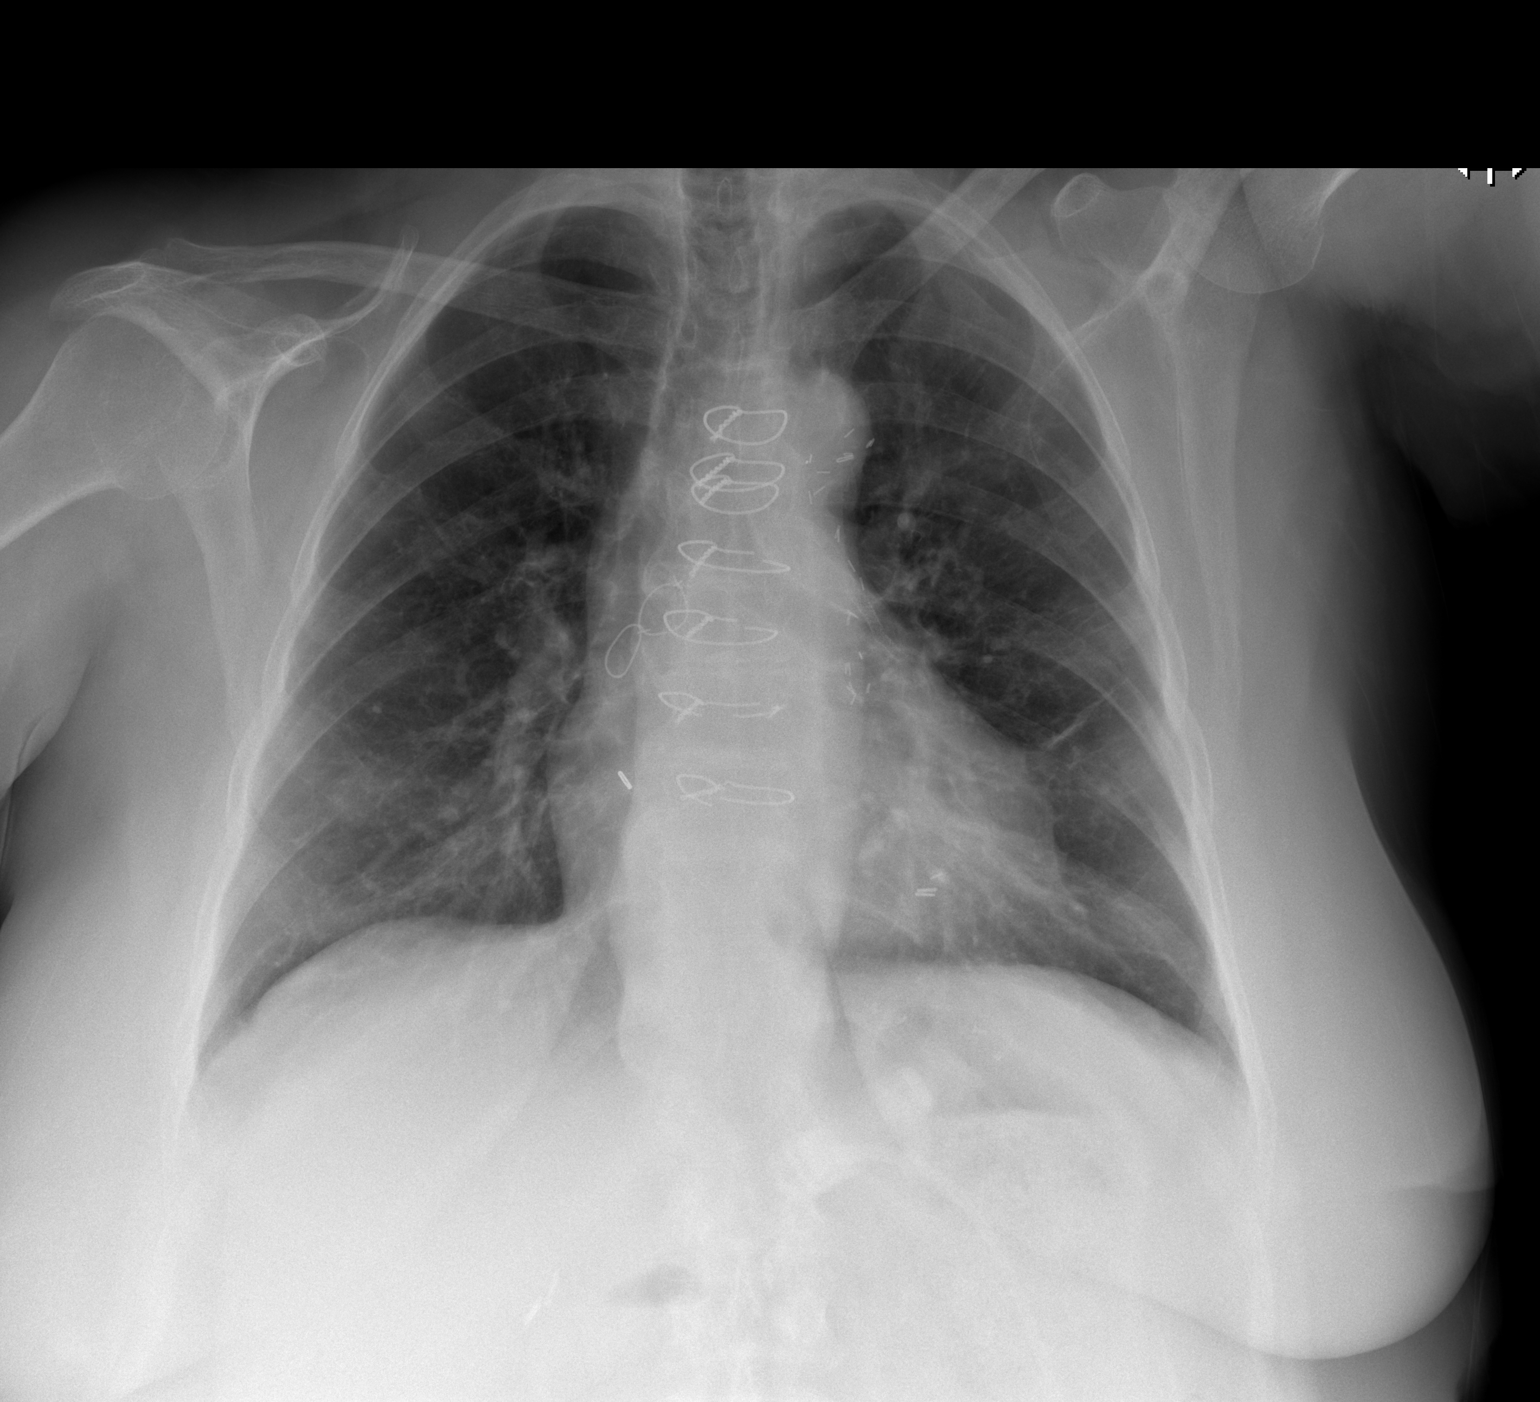

[w chest lat]
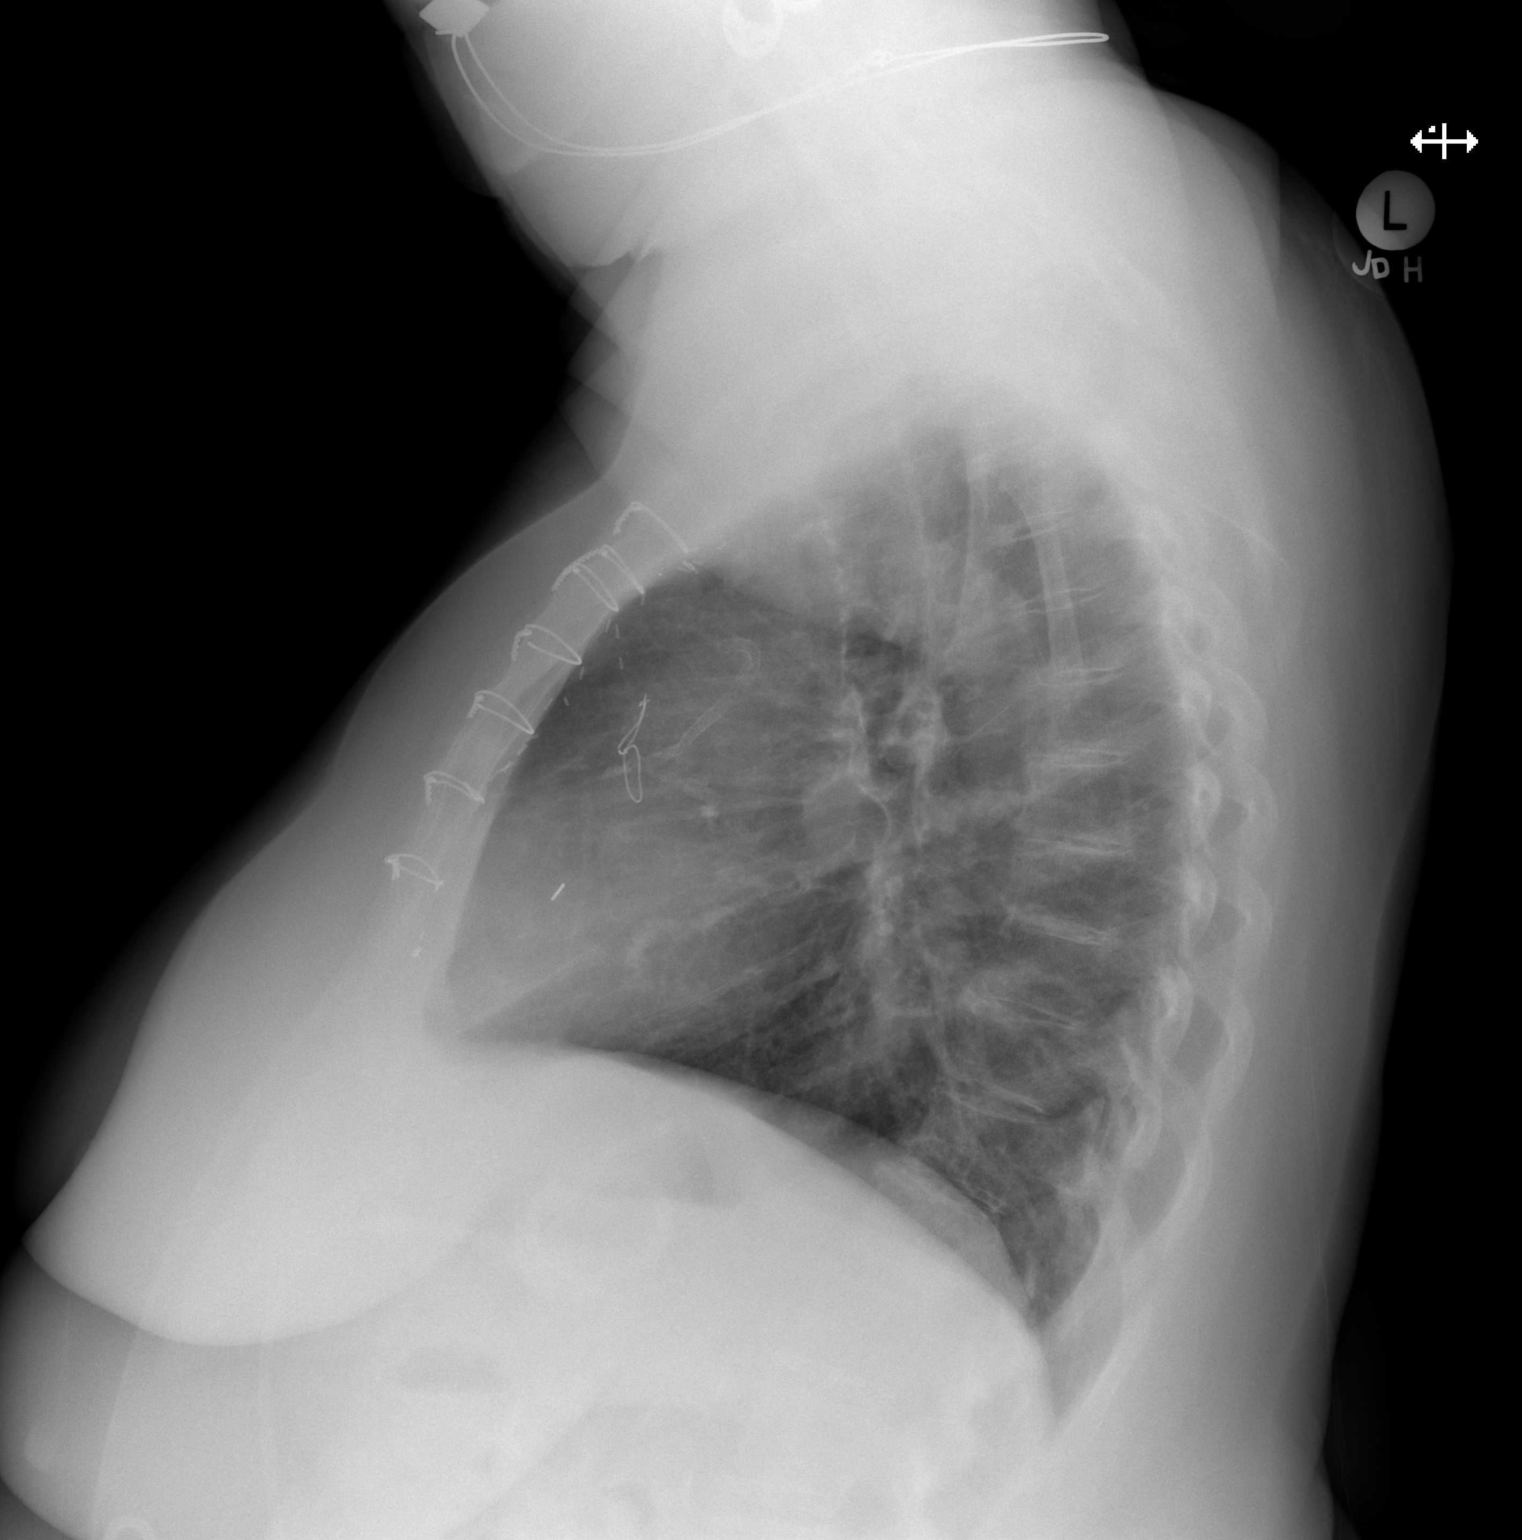

[2 of 2 positions shown; findings below may reference images not displayed]

FINDINGS: Cardiomediastinal silhouette is stable. Status post CABG again
noted. No acute infiltrate or pleural effusion. No pulmonary edema.
Again noted chronic mild interstitial prominence. Mild perihilar
bronchitic changes. Osteopenia and mild degenerative changes
thoracic spine.
IMPRESSION: No infiltrate or pulmonary edema. Stable chronic mild interstitial
prominence. Mild perihilar bronchitic changes.

## 2017-04-15 IMAGING — CR DG KNEE COMPLETE 4+V*L*
4 series · 4 of 4 positions shown · non-contrast
Comparison: None.

CLINICAL DATA: Bilateral knee pain for several months, left worse
than right.

EXAM:
LEFT KNEE - COMPLETE 4+ VIEW

[w knee ap left]
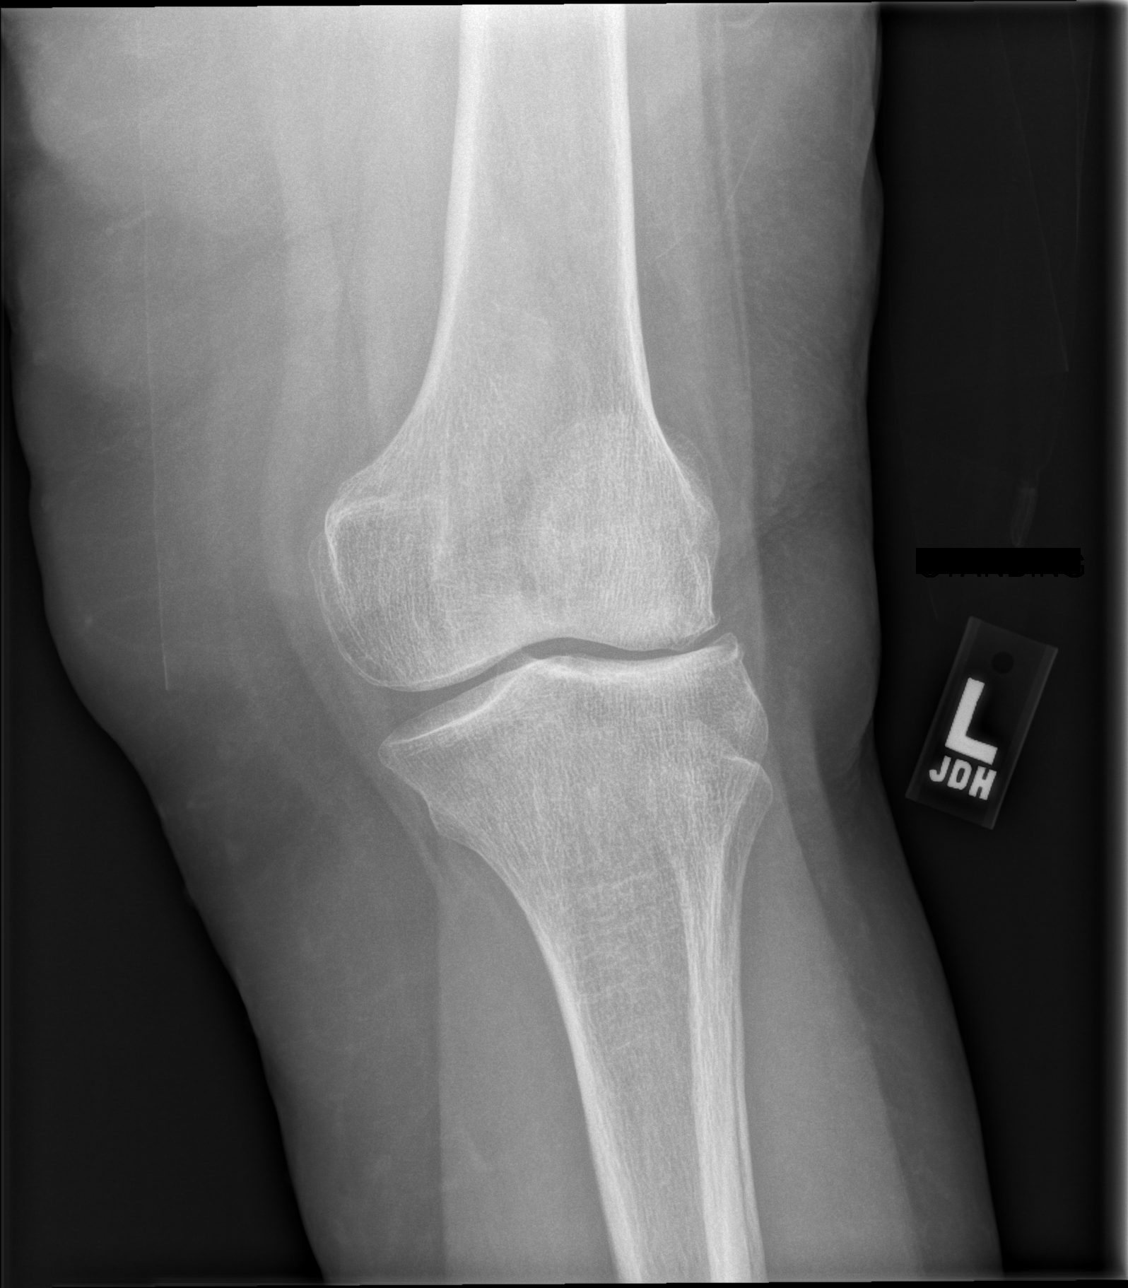

[w knee lat left]
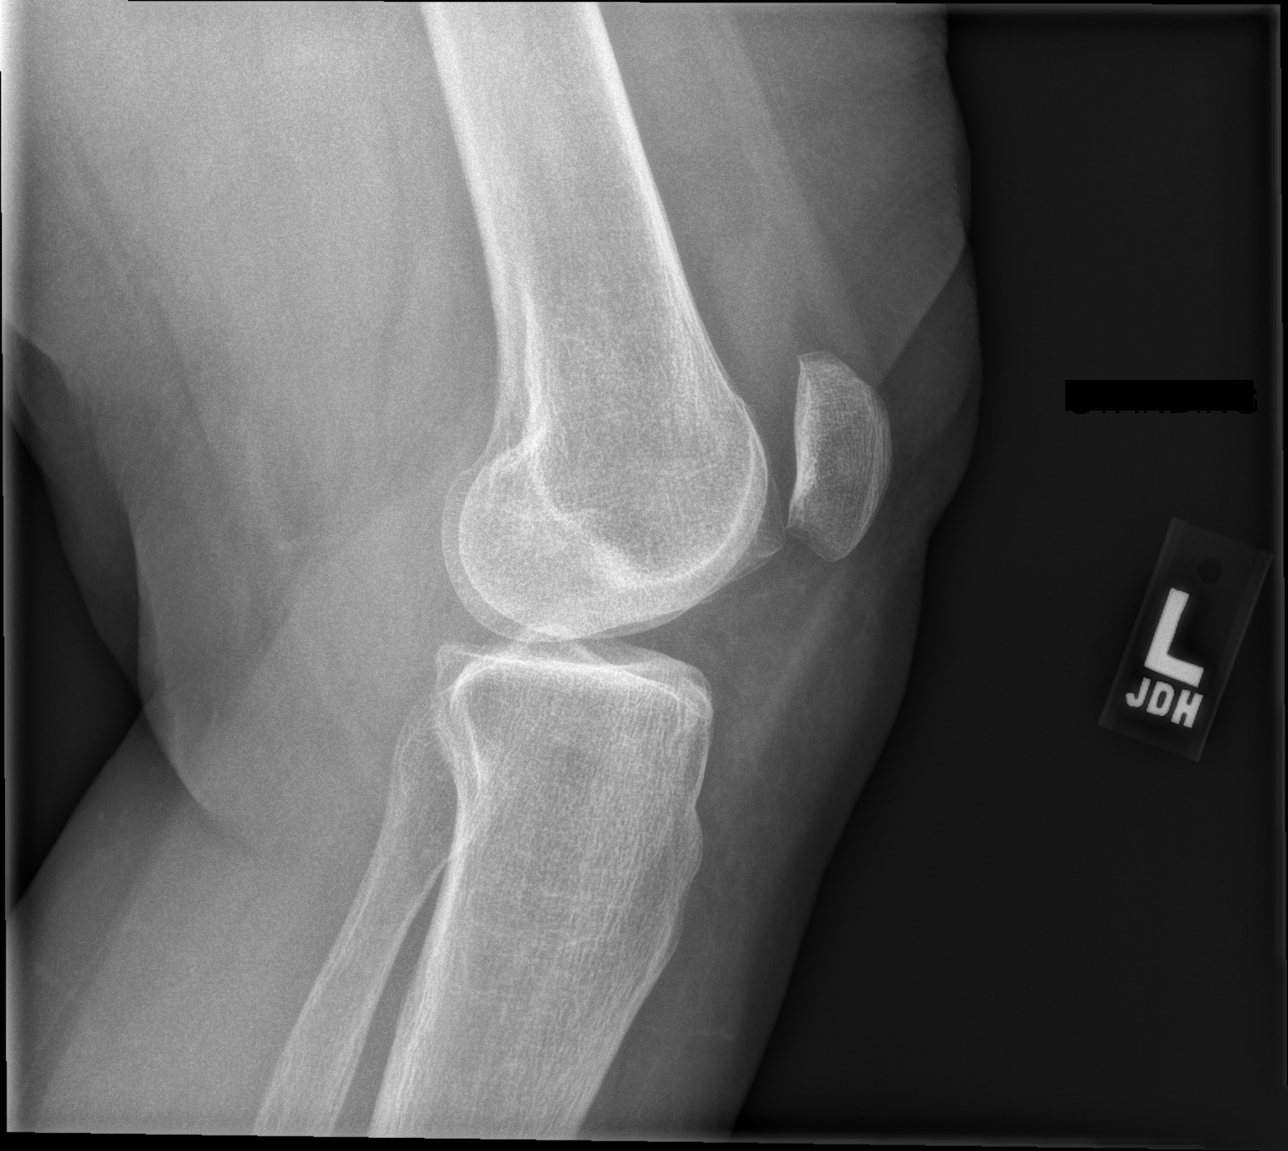

[x knee sunrise left]
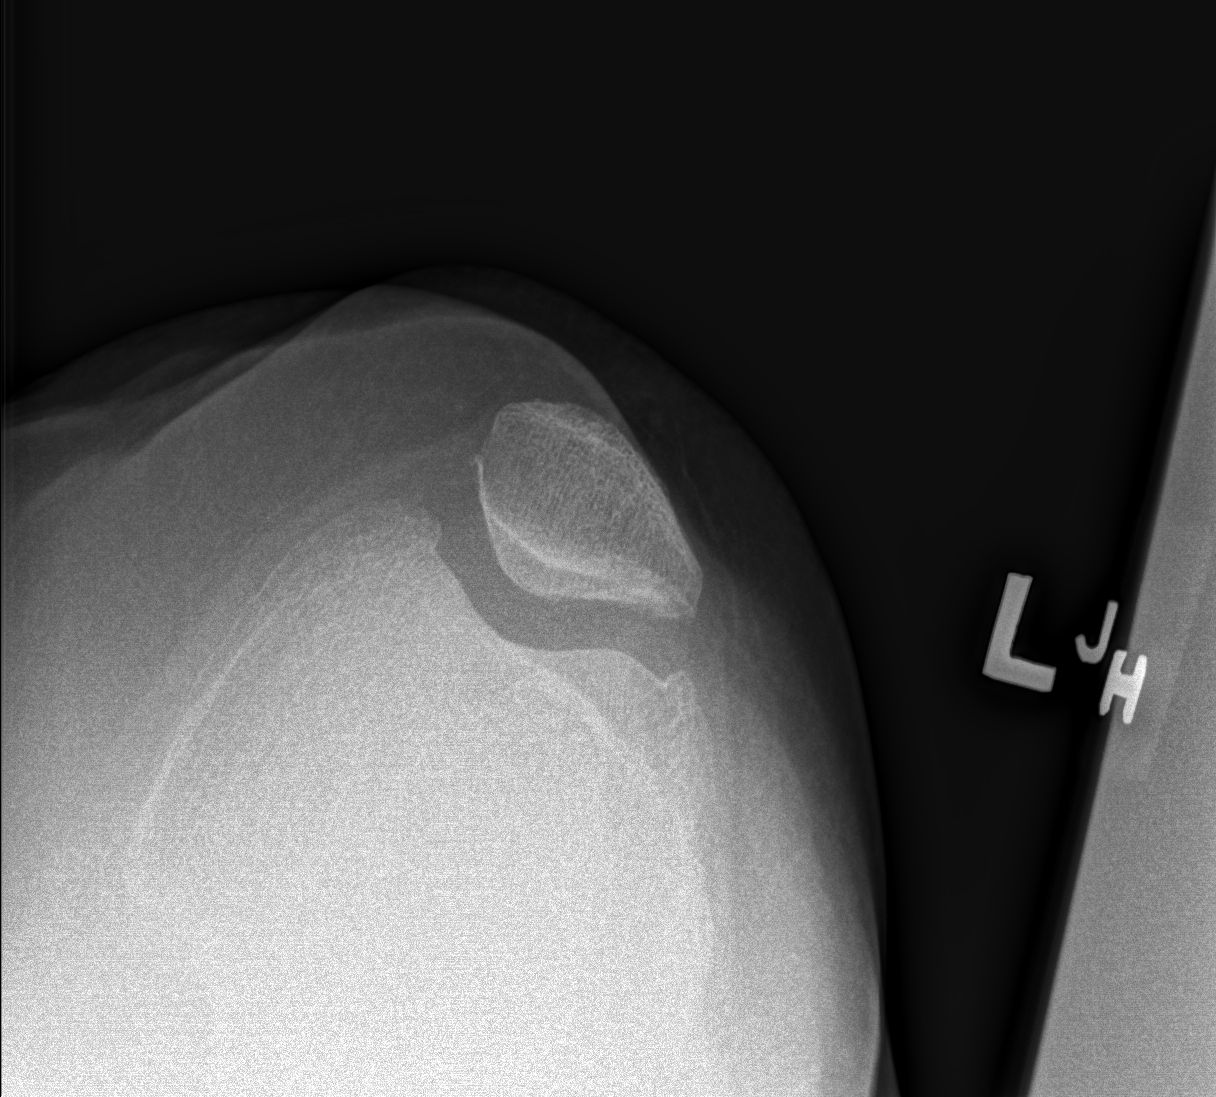

[x knee tunnel left]
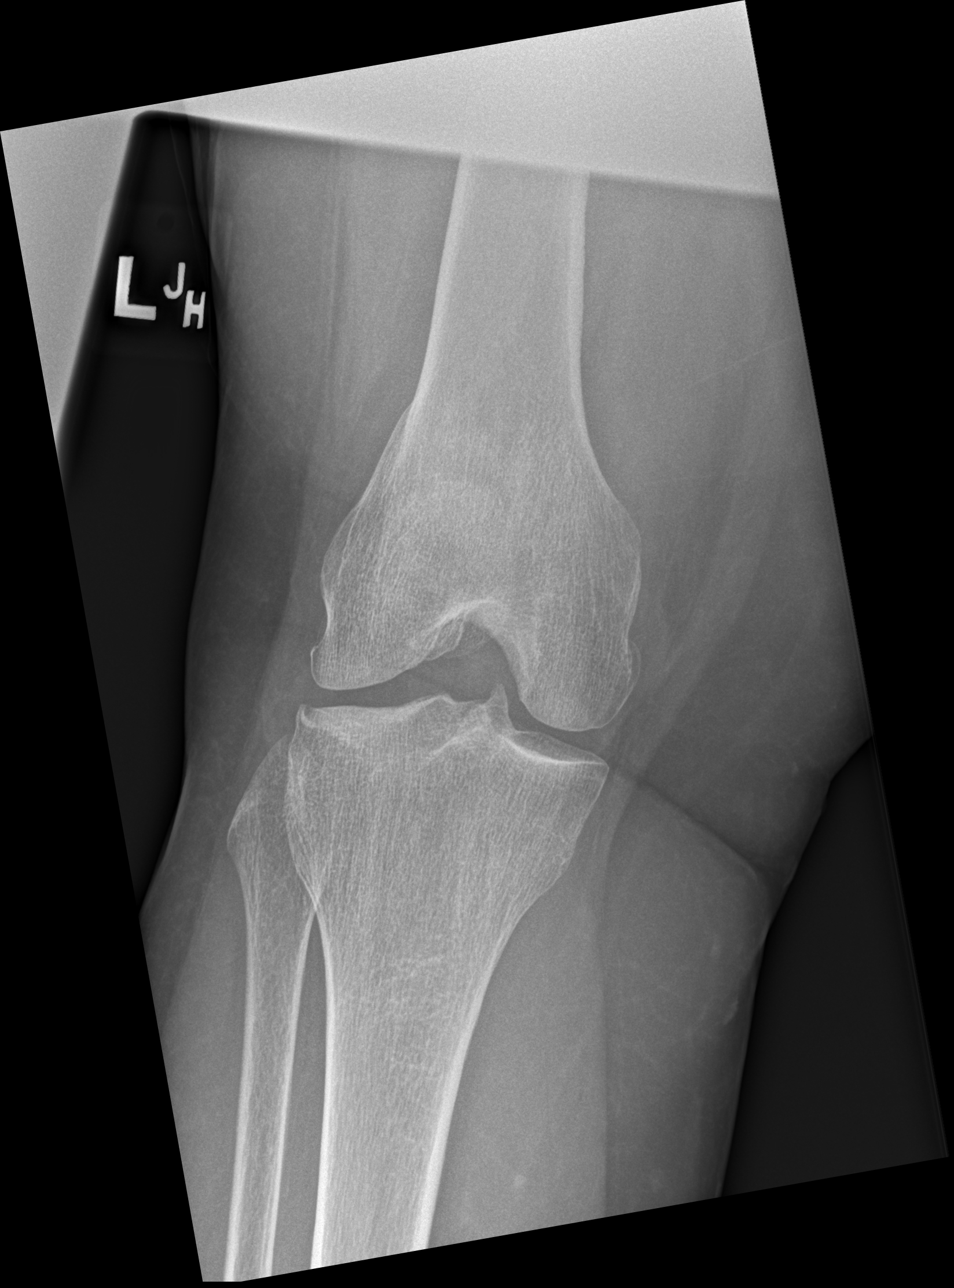

[4 of 4 positions shown; findings below may reference images not displayed]

FINDINGS: Small knee joint effusion. There is lateral compartment
osteoarthritis with joint space narrowing and marginal osteophytes.
Medial compartment and patellofemoral joint appear unremarkable. No
focal lesion.
IMPRESSION: Lateral compartment osteoarthritis with joint space narrowing and
marginal osteophytes. Small joint effusion.

## 2017-04-15 IMAGING — CR DG KNEE COMPLETE 4+V*R*
4 series · 4 of 4 positions shown · non-contrast
Comparison: 09/03/2004

CLINICAL DATA: Bilateral knee pain left worse than right.

EXAM:
RIGHT KNEE - COMPLETE 4+ VIEW

[w knee ap right]
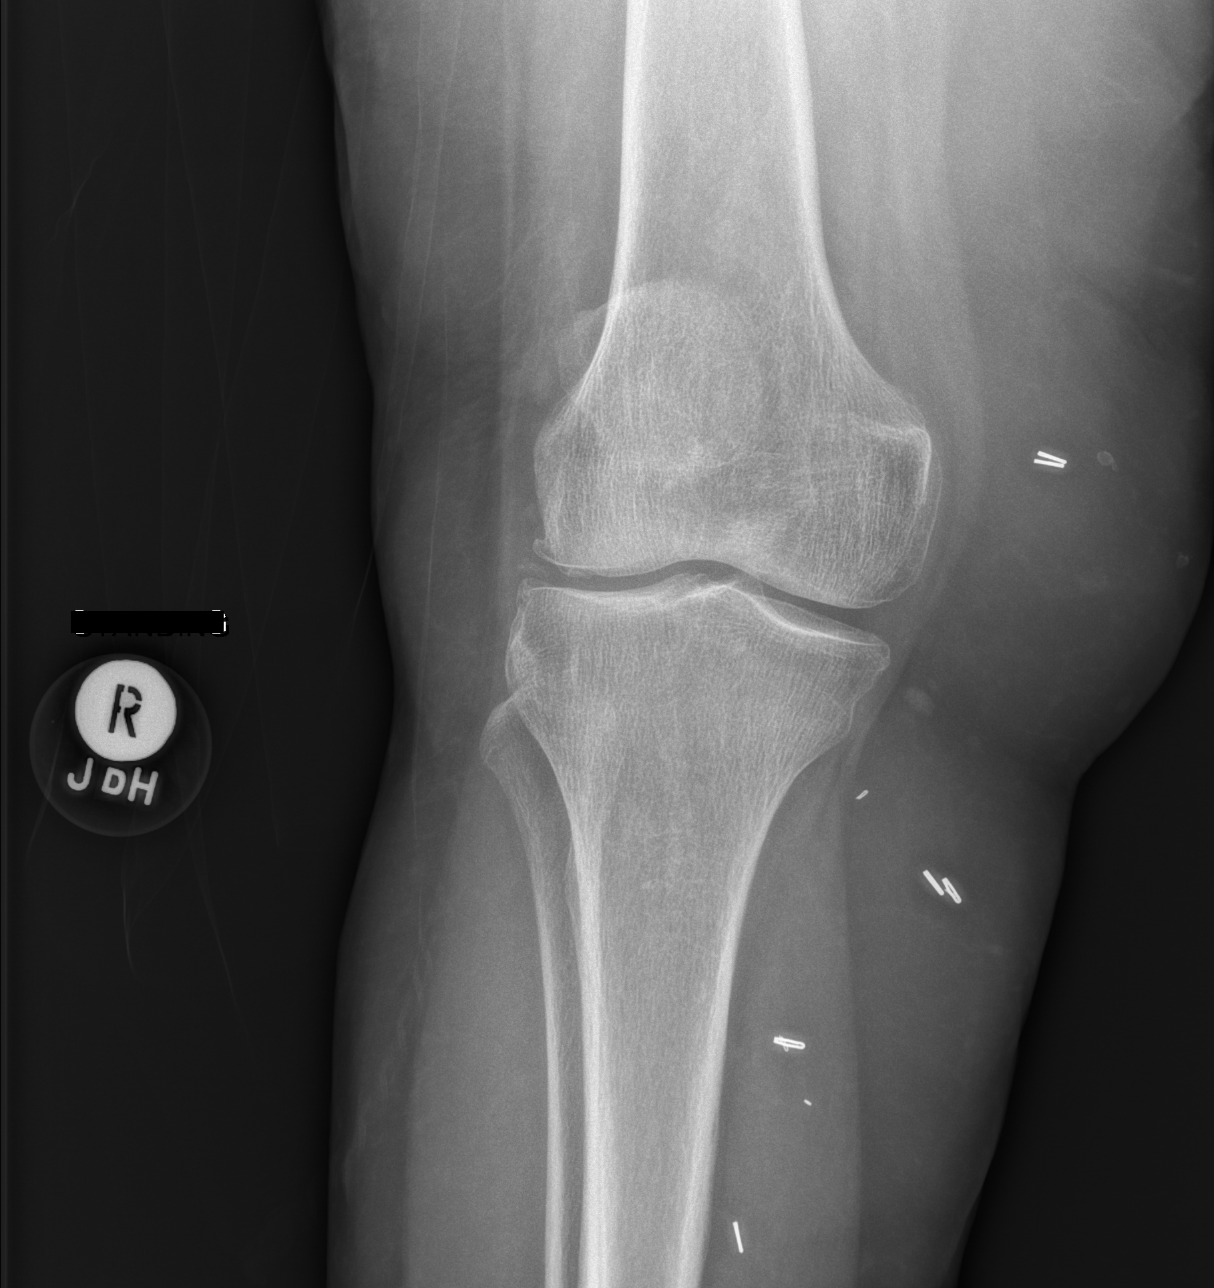

[w knee lat right]
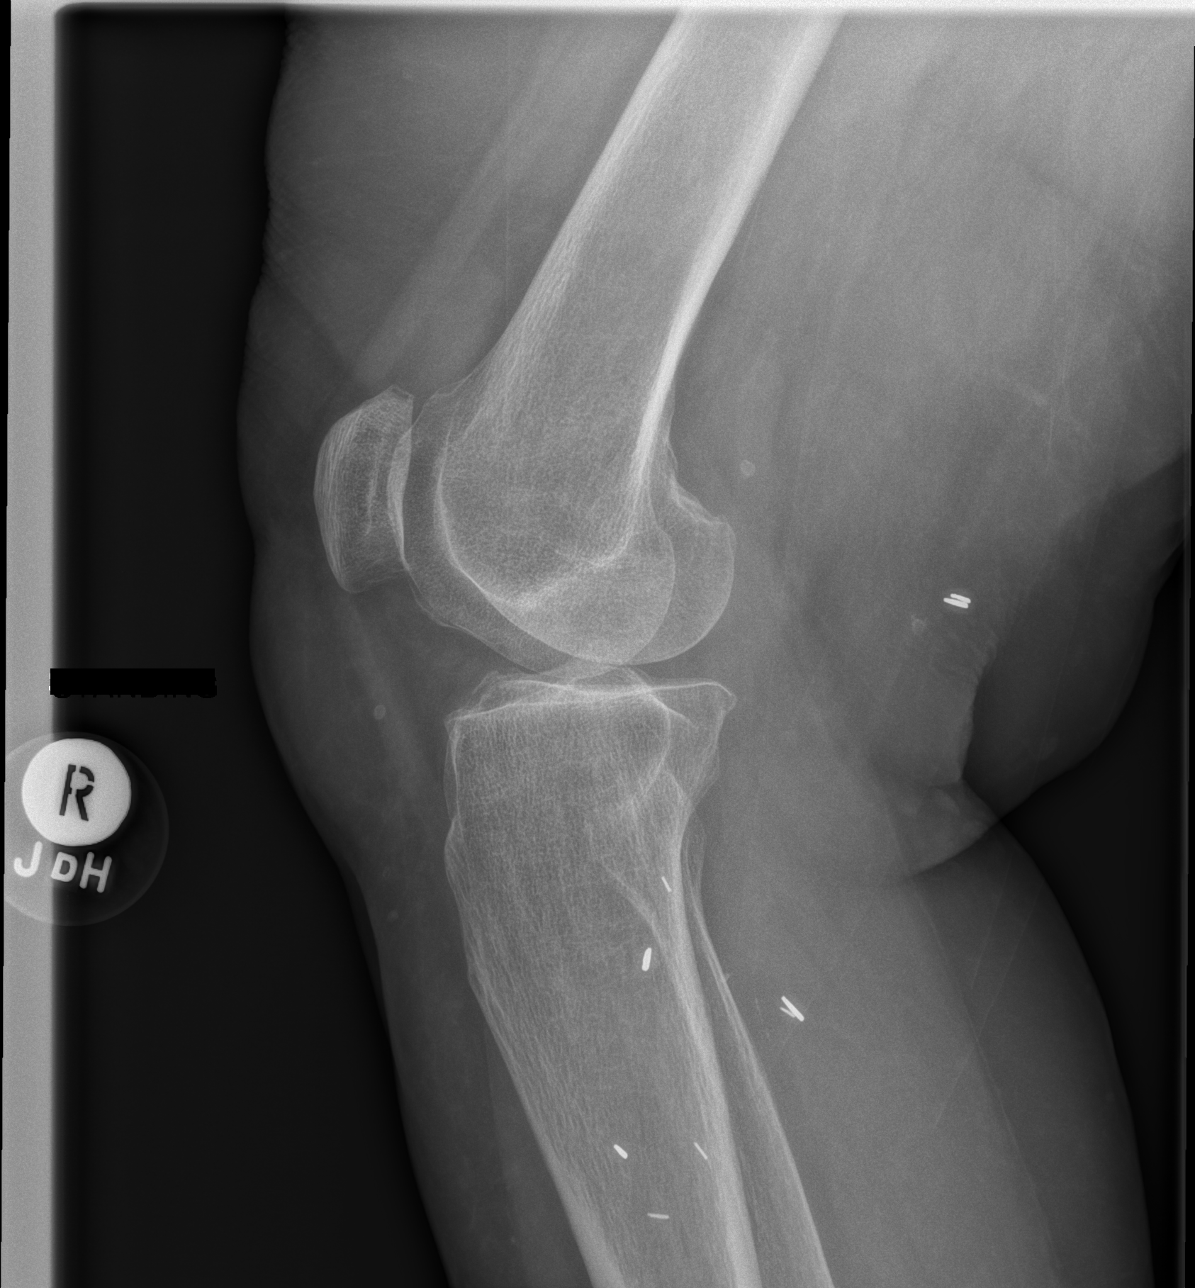

[x knee tunnel right]
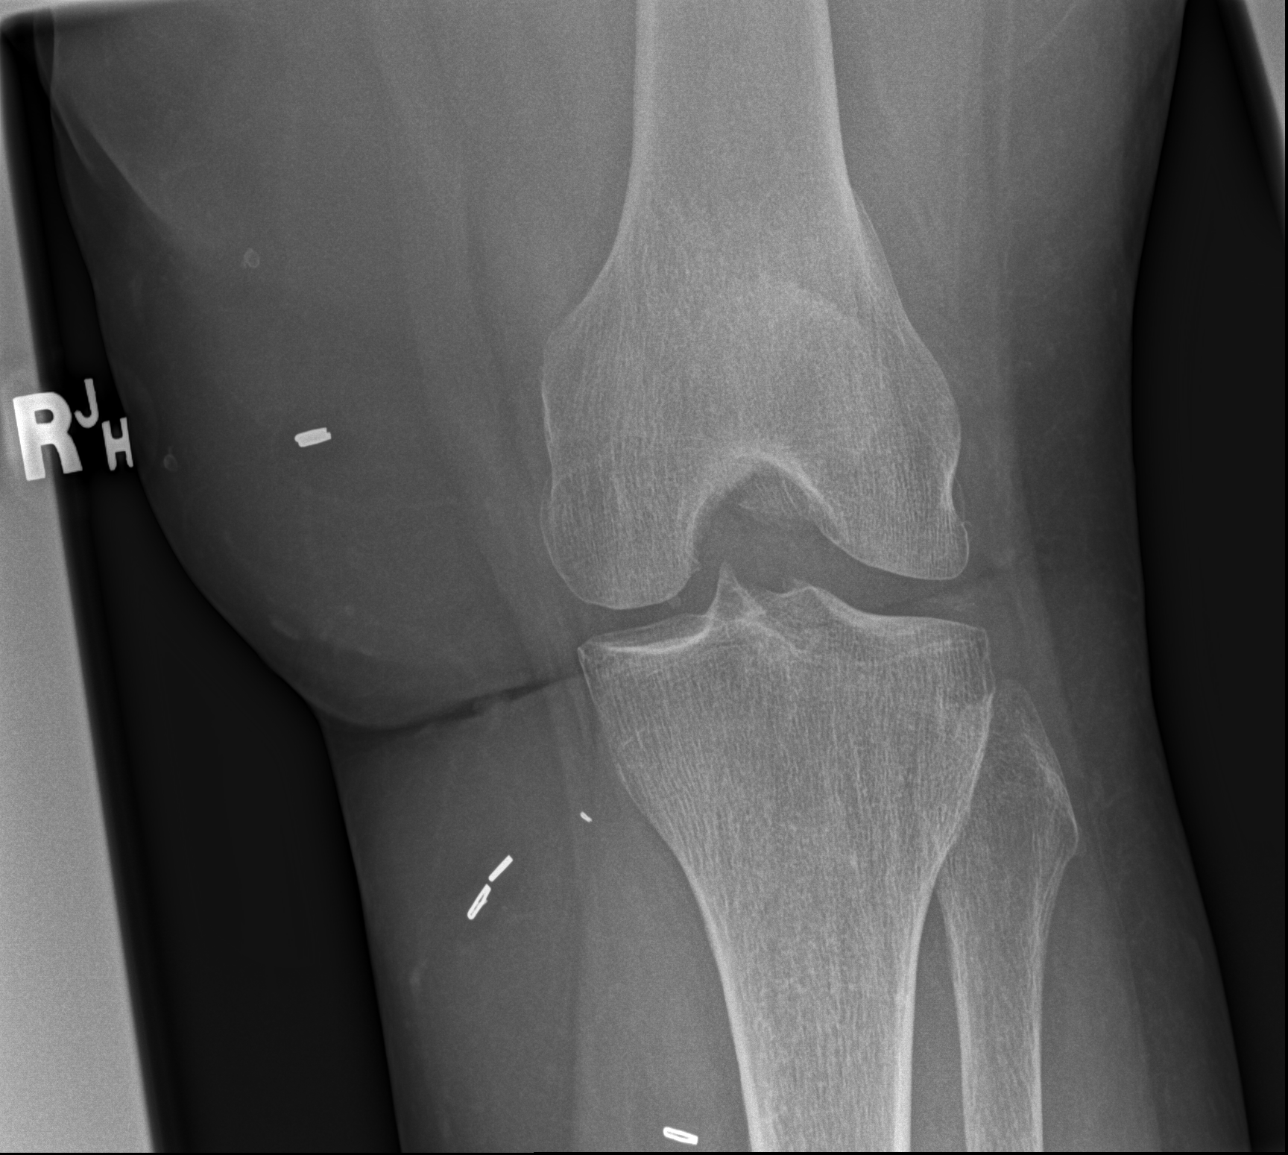

[x knee sunrise right]
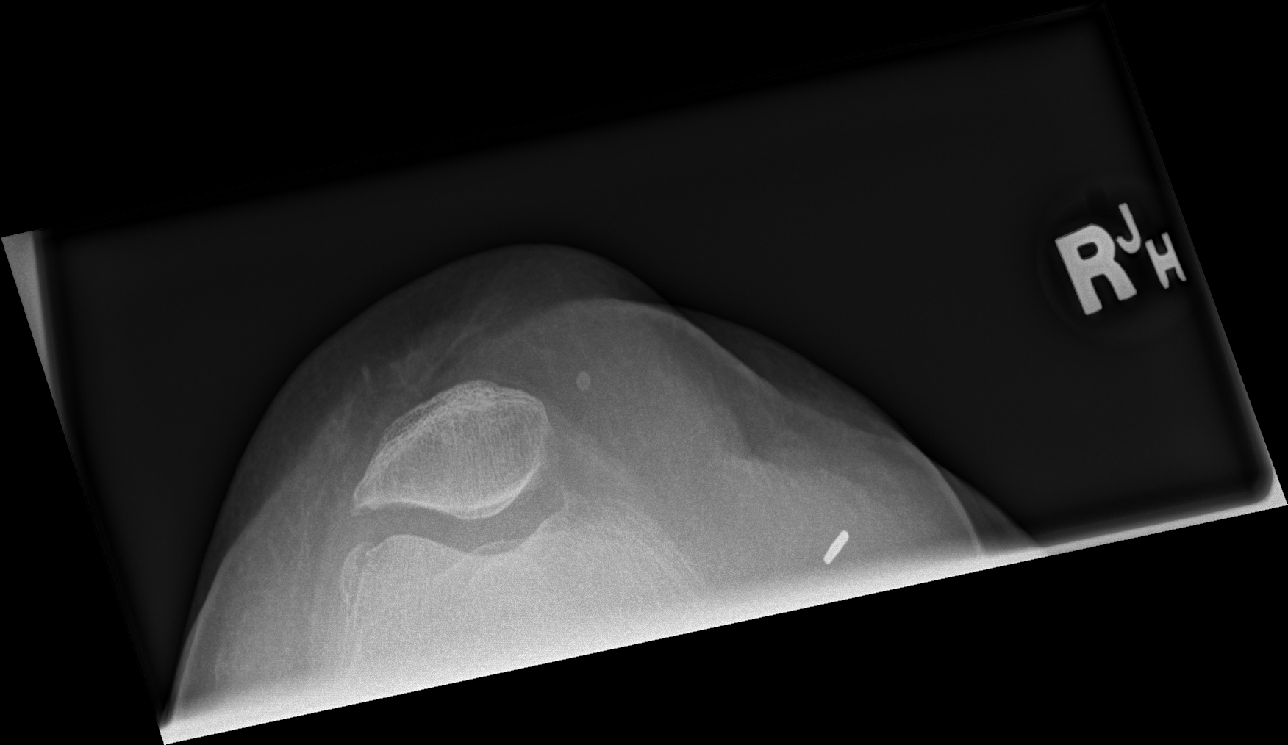

[4 of 4 positions shown; findings below may reference images not displayed]

FINDINGS: Lateral compartment osteoarthritis with joint space narrowing and
marginal osteophytes. Chondrocalcinosis of the lateral meniscus.
Small joint effusion. Medial compartment and patellofemoral joint
appear unremarkable. Vascular clips noted in the soft tissues.
IMPRESSION: Lateral compartment osteoarthritis with joint space narrowing and
marginal osteophytes. Small joint effusion. Progressive since [DATE].

## 2017-04-16 ENCOUNTER — Telehealth: Payer: Self-pay | Admitting: *Deleted

## 2017-04-16 NOTE — Telephone Encounter (Signed)
FAXED INFORMATION 04/15/17 PER DR HARDING MAY STOP PLAVIX FOR 5-7 DAYS FOR PROCEDURE( EGD) AND 10 DAY FOLLOWING THE PROCEDURE

## 2017-04-28 DIAGNOSIS — L989 Disorder of the skin and subcutaneous tissue, unspecified: Secondary | ICD-10-CM | POA: Diagnosis not present

## 2017-04-28 DIAGNOSIS — C44722 Squamous cell carcinoma of skin of right lower limb, including hip: Secondary | ICD-10-CM | POA: Diagnosis not present

## 2017-04-28 LAB — HM PAP SMEAR

## 2017-04-29 ENCOUNTER — Other Ambulatory Visit: Payer: Self-pay | Admitting: Physical Medicine & Rehabilitation

## 2017-04-29 ENCOUNTER — Other Ambulatory Visit: Payer: Self-pay | Admitting: General Surgery

## 2017-04-29 DIAGNOSIS — C44722 Squamous cell carcinoma of skin of right lower limb, including hip: Secondary | ICD-10-CM | POA: Diagnosis not present

## 2017-04-29 DIAGNOSIS — M47816 Spondylosis without myelopathy or radiculopathy, lumbar region: Secondary | ICD-10-CM

## 2017-04-29 DIAGNOSIS — M797 Fibromyalgia: Secondary | ICD-10-CM

## 2017-05-04 DIAGNOSIS — E119 Type 2 diabetes mellitus without complications: Secondary | ICD-10-CM | POA: Diagnosis not present

## 2017-05-04 DIAGNOSIS — I2510 Atherosclerotic heart disease of native coronary artery without angina pectoris: Secondary | ICD-10-CM | POA: Diagnosis not present

## 2017-05-04 DIAGNOSIS — N839 Noninflammatory disorder of ovary, fallopian tube and broad ligament, unspecified: Secondary | ICD-10-CM | POA: Diagnosis not present

## 2017-05-04 DIAGNOSIS — Z7951 Long term (current) use of inhaled steroids: Secondary | ICD-10-CM | POA: Diagnosis not present

## 2017-05-04 DIAGNOSIS — Z01818 Encounter for other preprocedural examination: Secondary | ICD-10-CM | POA: Diagnosis not present

## 2017-05-04 DIAGNOSIS — K317 Polyp of stomach and duodenum: Secondary | ICD-10-CM | POA: Diagnosis not present

## 2017-05-04 DIAGNOSIS — Z8673 Personal history of transient ischemic attack (TIA), and cerebral infarction without residual deficits: Secondary | ICD-10-CM | POA: Diagnosis not present

## 2017-05-04 DIAGNOSIS — J45909 Unspecified asthma, uncomplicated: Secondary | ICD-10-CM | POA: Diagnosis not present

## 2017-05-04 DIAGNOSIS — K219 Gastro-esophageal reflux disease without esophagitis: Secondary | ICD-10-CM | POA: Diagnosis not present

## 2017-05-04 DIAGNOSIS — Z9989 Dependence on other enabling machines and devices: Secondary | ICD-10-CM | POA: Diagnosis not present

## 2017-05-04 DIAGNOSIS — Z794 Long term (current) use of insulin: Secondary | ICD-10-CM | POA: Diagnosis not present

## 2017-05-04 DIAGNOSIS — N838 Other noninflammatory disorders of ovary, fallopian tube and broad ligament: Secondary | ICD-10-CM | POA: Diagnosis not present

## 2017-05-04 DIAGNOSIS — E118 Type 2 diabetes mellitus with unspecified complications: Secondary | ICD-10-CM | POA: Diagnosis not present

## 2017-05-04 DIAGNOSIS — Z79899 Other long term (current) drug therapy: Secondary | ICD-10-CM | POA: Diagnosis not present

## 2017-05-04 DIAGNOSIS — Z940 Kidney transplant status: Secondary | ICD-10-CM | POA: Diagnosis not present

## 2017-05-04 DIAGNOSIS — G8929 Other chronic pain: Secondary | ICD-10-CM | POA: Diagnosis not present

## 2017-05-04 DIAGNOSIS — G4733 Obstructive sleep apnea (adult) (pediatric): Secondary | ICD-10-CM | POA: Diagnosis not present

## 2017-05-04 DIAGNOSIS — F329 Major depressive disorder, single episode, unspecified: Secondary | ICD-10-CM | POA: Diagnosis not present

## 2017-05-05 ENCOUNTER — Telehealth: Payer: Self-pay | Admitting: Cardiology

## 2017-05-06 ENCOUNTER — Encounter: Payer: Medicare Other | Admitting: Psychology

## 2017-05-07 NOTE — Telephone Encounter (Signed)
Talked to Dr Marlena Clipper have [name] call him on his cell phone-[phone].

## 2017-05-07 NOTE — Telephone Encounter (Signed)
Tried to call the # listed - no voice mail.  Not available.    [name], MD

## 2017-05-07 NOTE — Telephone Encounter (Signed)
I was able to talk with Dr. Levada Dy. We discussed holding Plavix for basically what amounts to being 2-3 weeks. That's acceptable based on her distant PCI...  [name], MD

## 2017-05-13 DIAGNOSIS — M5136 Other intervertebral disc degeneration, lumbar region: Secondary | ICD-10-CM | POA: Diagnosis not present

## 2017-05-13 DIAGNOSIS — M25511 Pain in right shoulder: Secondary | ICD-10-CM | POA: Diagnosis not present

## 2017-05-13 DIAGNOSIS — M25551 Pain in right hip: Secondary | ICD-10-CM | POA: Diagnosis not present

## 2017-05-18 DIAGNOSIS — E669 Obesity, unspecified: Secondary | ICD-10-CM | POA: Diagnosis not present

## 2017-05-18 DIAGNOSIS — Z940 Kidney transplant status: Secondary | ICD-10-CM | POA: Diagnosis not present

## 2017-05-18 DIAGNOSIS — N949 Unspecified condition associated with female genital organs and menstrual cycle: Secondary | ICD-10-CM | POA: Diagnosis not present

## 2017-05-18 DIAGNOSIS — N186 End stage renal disease: Secondary | ICD-10-CM | POA: Diagnosis not present

## 2017-05-18 DIAGNOSIS — M797 Fibromyalgia: Secondary | ICD-10-CM | POA: Diagnosis not present

## 2017-05-18 DIAGNOSIS — D132 Benign neoplasm of duodenum: Secondary | ICD-10-CM | POA: Diagnosis not present

## 2017-05-18 DIAGNOSIS — N83209 Unspecified ovarian cyst, unspecified side: Secondary | ICD-10-CM | POA: Insufficient documentation

## 2017-05-18 DIAGNOSIS — Z7902 Long term (current) use of antithrombotics/antiplatelets: Secondary | ICD-10-CM | POA: Diagnosis not present

## 2017-05-18 DIAGNOSIS — Z9884 Bariatric surgery status: Secondary | ICD-10-CM | POA: Diagnosis not present

## 2017-05-18 DIAGNOSIS — Z8673 Personal history of transient ischemic attack (TIA), and cerebral infarction without residual deficits: Secondary | ICD-10-CM | POA: Diagnosis not present

## 2017-05-18 DIAGNOSIS — I2510 Atherosclerotic heart disease of native coronary artery without angina pectoris: Secondary | ICD-10-CM | POA: Diagnosis not present

## 2017-05-18 DIAGNOSIS — Z885 Allergy status to narcotic agent status: Secondary | ICD-10-CM | POA: Diagnosis not present

## 2017-05-18 DIAGNOSIS — Z79899 Other long term (current) drug therapy: Secondary | ICD-10-CM | POA: Diagnosis not present

## 2017-05-18 DIAGNOSIS — B370 Candidal stomatitis: Secondary | ICD-10-CM | POA: Diagnosis not present

## 2017-05-18 DIAGNOSIS — Z87891 Personal history of nicotine dependence: Secondary | ICD-10-CM | POA: Diagnosis not present

## 2017-05-18 DIAGNOSIS — Z951 Presence of aortocoronary bypass graft: Secondary | ICD-10-CM | POA: Diagnosis not present

## 2017-05-18 DIAGNOSIS — Z794 Long term (current) use of insulin: Secondary | ICD-10-CM | POA: Diagnosis not present

## 2017-05-18 DIAGNOSIS — Z882 Allergy status to sulfonamides status: Secondary | ICD-10-CM | POA: Diagnosis not present

## 2017-05-18 DIAGNOSIS — I4891 Unspecified atrial fibrillation: Secondary | ICD-10-CM | POA: Diagnosis not present

## 2017-05-18 DIAGNOSIS — E785 Hyperlipidemia, unspecified: Secondary | ICD-10-CM | POA: Diagnosis not present

## 2017-05-18 DIAGNOSIS — D270 Benign neoplasm of right ovary: Secondary | ICD-10-CM | POA: Diagnosis not present

## 2017-05-18 DIAGNOSIS — Z881 Allergy status to other antibiotic agents status: Secondary | ICD-10-CM | POA: Diagnosis not present

## 2017-05-18 DIAGNOSIS — Z7984 Long term (current) use of oral hypoglycemic drugs: Secondary | ICD-10-CM | POA: Diagnosis not present

## 2017-05-18 DIAGNOSIS — Z955 Presence of coronary angioplasty implant and graft: Secondary | ICD-10-CM | POA: Diagnosis not present

## 2017-05-18 DIAGNOSIS — E1122 Type 2 diabetes mellitus with diabetic chronic kidney disease: Secondary | ICD-10-CM | POA: Diagnosis not present

## 2017-05-18 DIAGNOSIS — G4733 Obstructive sleep apnea (adult) (pediatric): Secondary | ICD-10-CM | POA: Diagnosis not present

## 2017-05-19 DIAGNOSIS — D132 Benign neoplasm of duodenum: Secondary | ICD-10-CM | POA: Diagnosis not present

## 2017-05-19 DIAGNOSIS — I2510 Atherosclerotic heart disease of native coronary artery without angina pectoris: Secondary | ICD-10-CM | POA: Diagnosis not present

## 2017-05-19 DIAGNOSIS — Z940 Kidney transplant status: Secondary | ICD-10-CM | POA: Diagnosis not present

## 2017-05-19 DIAGNOSIS — I4891 Unspecified atrial fibrillation: Secondary | ICD-10-CM | POA: Diagnosis not present

## 2017-05-19 DIAGNOSIS — D270 Benign neoplasm of right ovary: Secondary | ICD-10-CM | POA: Diagnosis not present

## 2017-05-19 DIAGNOSIS — E669 Obesity, unspecified: Secondary | ICD-10-CM | POA: Diagnosis not present

## 2017-05-21 ENCOUNTER — Ambulatory Visit: Payer: Medicare Other | Admitting: Emergency Medicine

## 2017-05-22 ENCOUNTER — Other Ambulatory Visit: Payer: Self-pay | Admitting: Physical Medicine & Rehabilitation

## 2017-05-22 DIAGNOSIS — E1142 Type 2 diabetes mellitus with diabetic polyneuropathy: Secondary | ICD-10-CM

## 2017-05-22 DIAGNOSIS — M797 Fibromyalgia: Secondary | ICD-10-CM

## 2017-05-22 IMAGING — CR DG CHEST 2V
2 series · 2 of 2 positions shown · non-contrast
Comparison: 04/01/2016

CLINICAL DATA: Shortness of breath. Cough. Hypertension. Diabetes.

EXAM:
CHEST  2 VIEW

[chest pa]
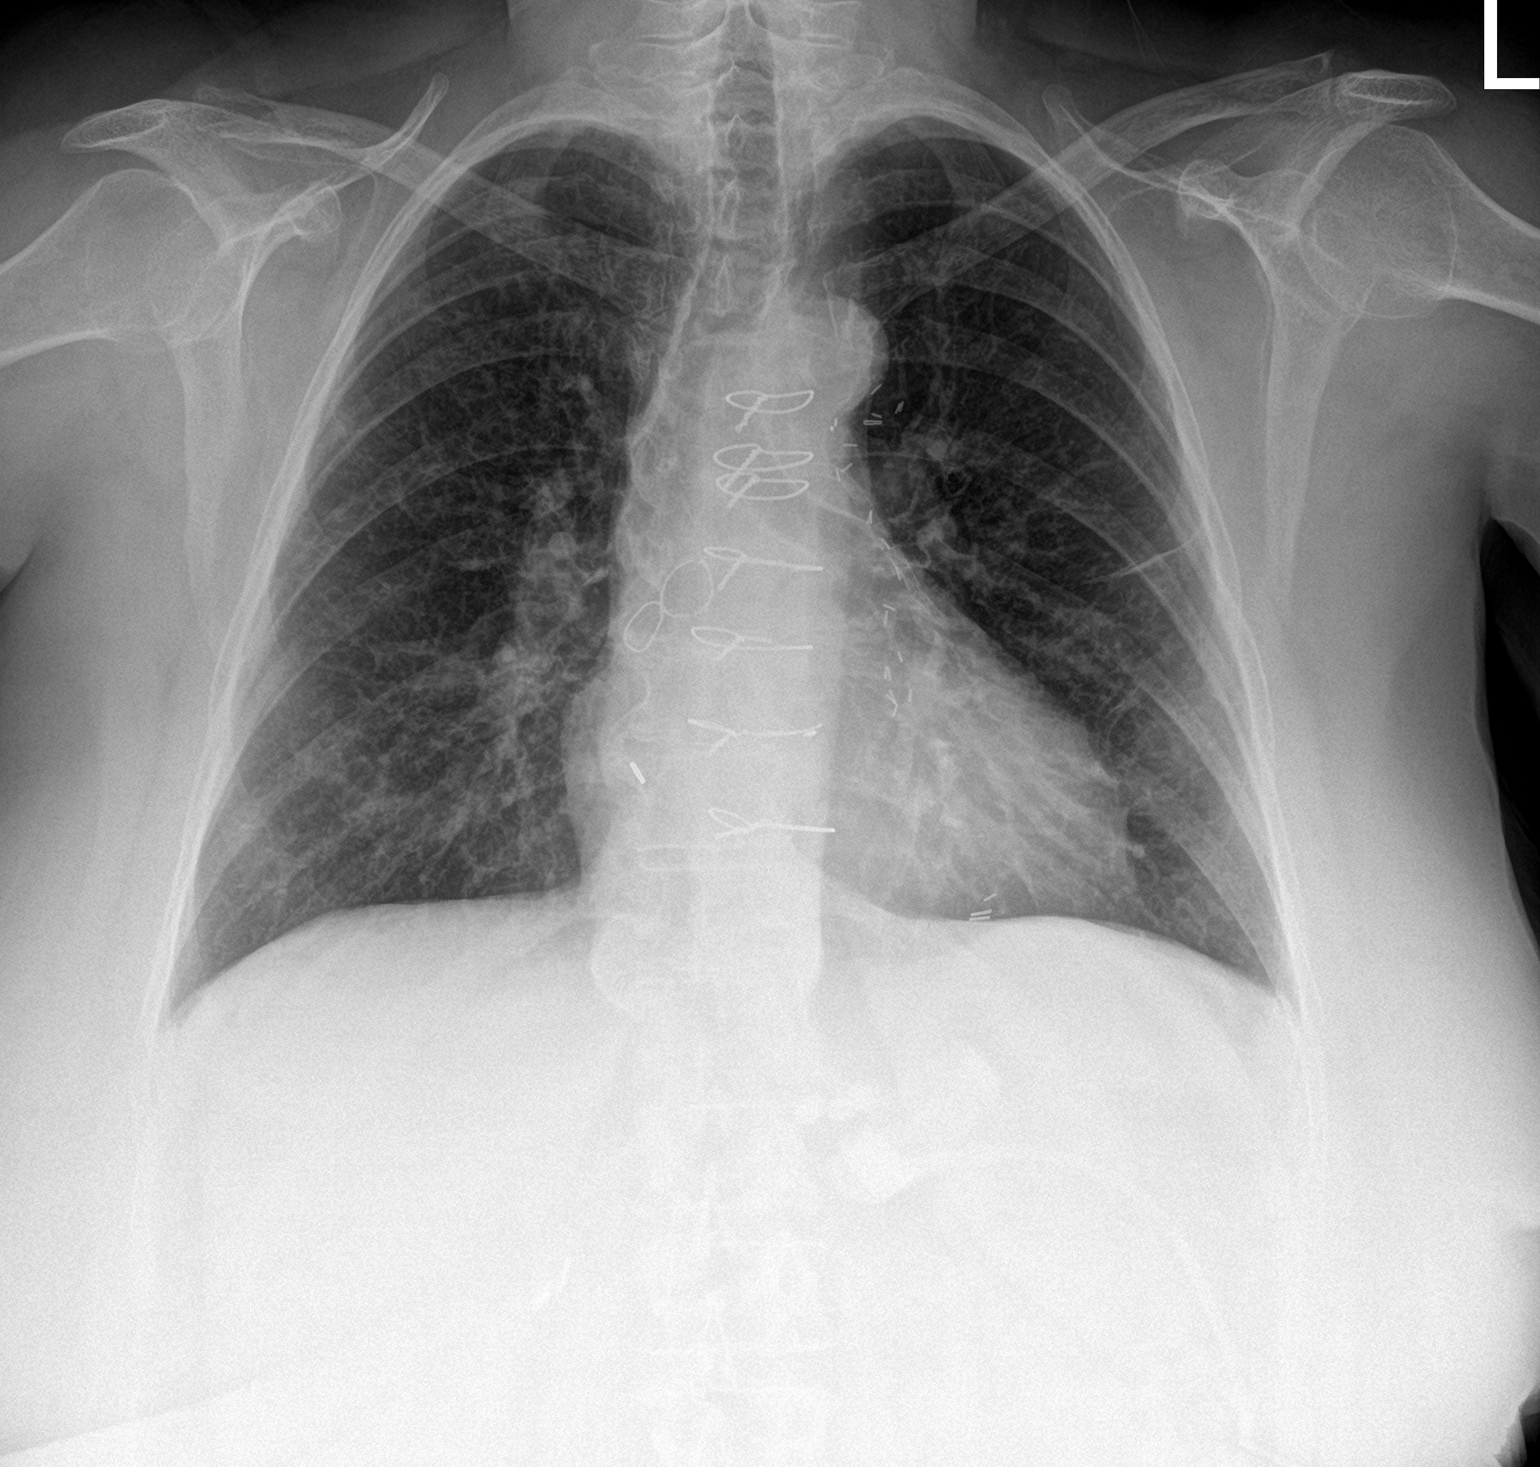

[chest lat]
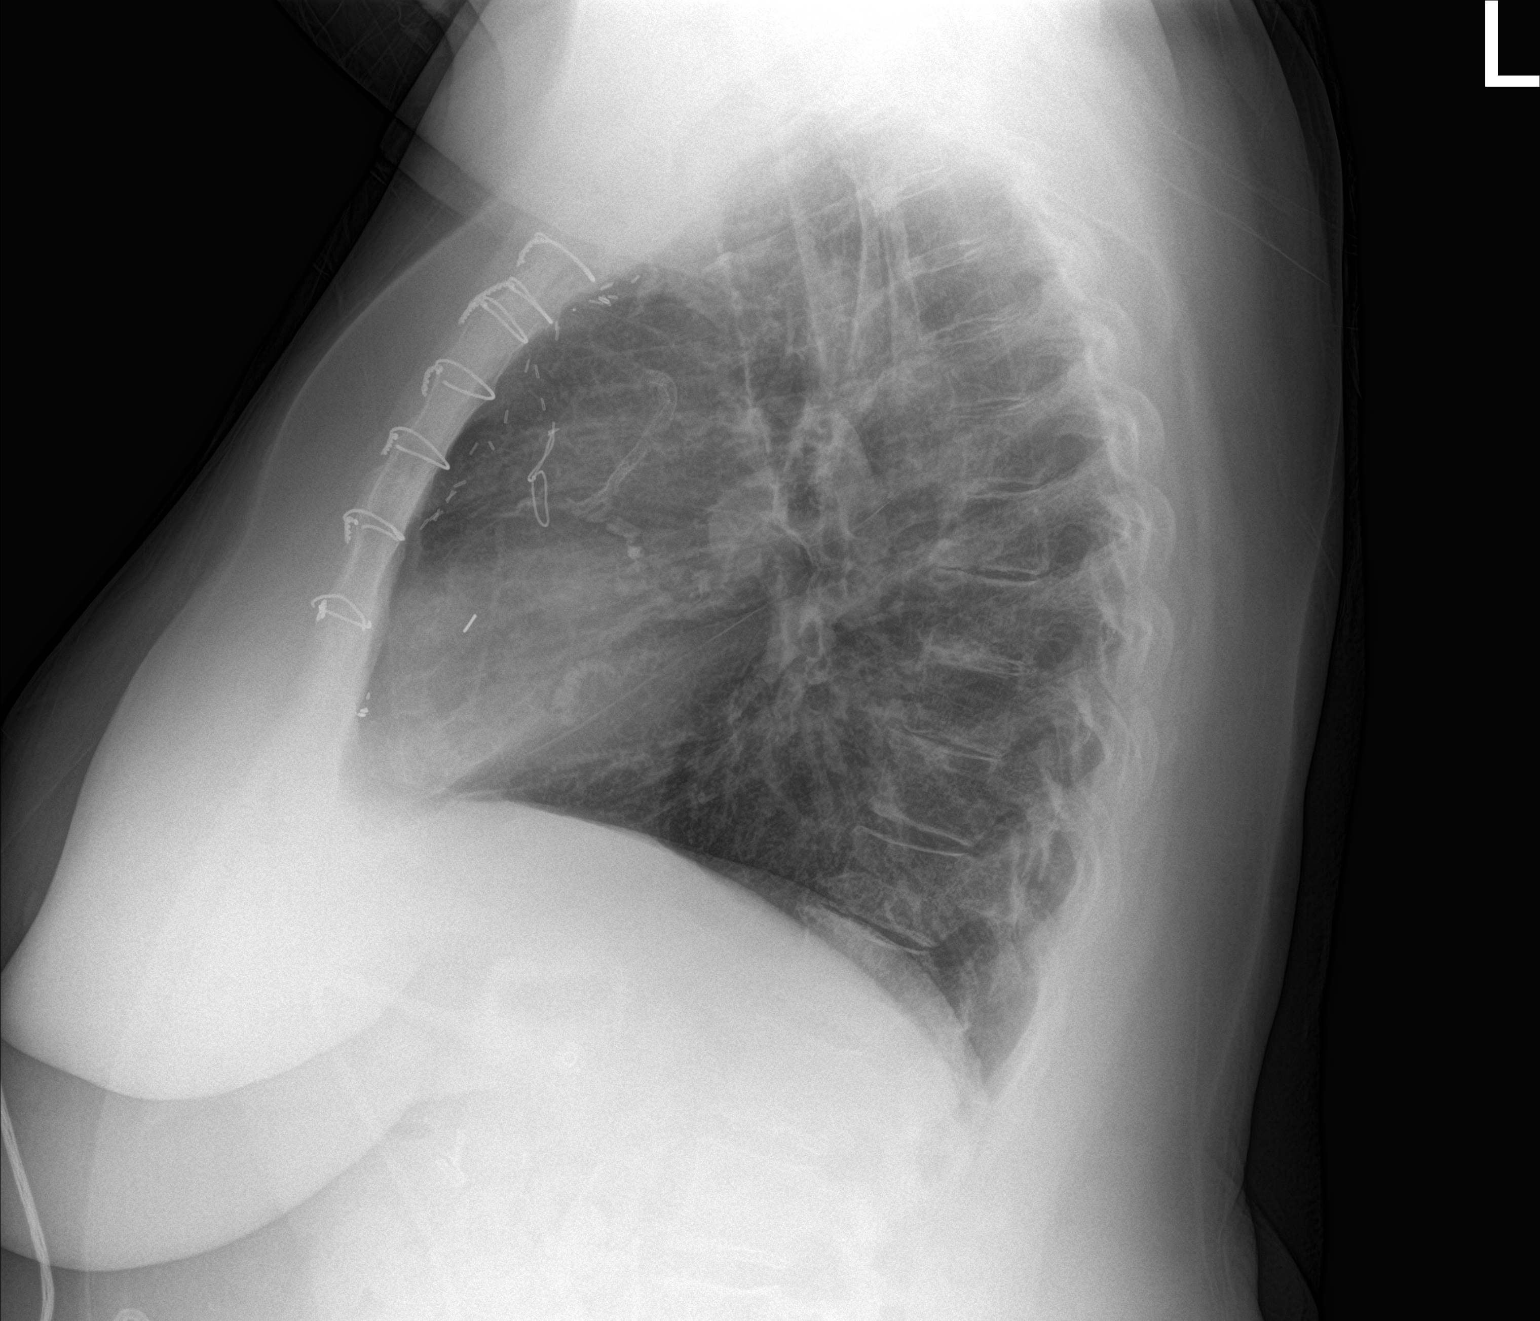

[2 of 2 positions shown; findings below may reference images not displayed]

FINDINGS: Prior CABG. Prior coronary stents. Borderline enlargement of the
cardiopericardial silhouette stable scarring in the left upper lobe.
Stable mild interstitial accentuation. Mild central airway
thickening. Dense calcification of the mitral valve. Lap band noted,
2 o'clock-7 o'clock orientation. Clips in the right upper quadrant.
Mild thoracic spondylosis. No pleural effusion.
IMPRESSION: 1. Airway thickening is present, suggesting bronchitis or reactive
airways disease.
2. Borderline enlargement of the cardiopericardial silhouette. Prior
CABG.
3. Lap band in place is noted above.

## 2017-05-26 ENCOUNTER — Encounter: Payer: Medicare Other | Attending: Registered Nurse | Admitting: Physical Medicine & Rehabilitation

## 2017-05-26 ENCOUNTER — Encounter: Payer: Self-pay | Admitting: Physical Medicine & Rehabilitation

## 2017-05-26 DIAGNOSIS — Z8673 Personal history of transient ischemic attack (TIA), and cerebral infarction without residual deficits: Secondary | ICD-10-CM | POA: Insufficient documentation

## 2017-05-26 DIAGNOSIS — Z992 Dependence on renal dialysis: Secondary | ICD-10-CM | POA: Diagnosis not present

## 2017-05-26 DIAGNOSIS — N186 End stage renal disease: Secondary | ICD-10-CM | POA: Insufficient documentation

## 2017-05-26 DIAGNOSIS — D631 Anemia in chronic kidney disease: Secondary | ICD-10-CM | POA: Insufficient documentation

## 2017-05-26 DIAGNOSIS — Z9851 Tubal ligation status: Secondary | ICD-10-CM | POA: Diagnosis not present

## 2017-05-26 DIAGNOSIS — I6523 Occlusion and stenosis of bilateral carotid arteries: Secondary | ICD-10-CM | POA: Diagnosis not present

## 2017-05-26 DIAGNOSIS — Z808 Family history of malignant neoplasm of other organs or systems: Secondary | ICD-10-CM | POA: Insufficient documentation

## 2017-05-26 DIAGNOSIS — E785 Hyperlipidemia, unspecified: Secondary | ICD-10-CM | POA: Diagnosis not present

## 2017-05-26 DIAGNOSIS — Z87891 Personal history of nicotine dependence: Secondary | ICD-10-CM | POA: Diagnosis not present

## 2017-05-26 DIAGNOSIS — Z955 Presence of coronary angioplasty implant and graft: Secondary | ICD-10-CM | POA: Insufficient documentation

## 2017-05-26 DIAGNOSIS — Z9884 Bariatric surgery status: Secondary | ICD-10-CM | POA: Insufficient documentation

## 2017-05-26 DIAGNOSIS — E1122 Type 2 diabetes mellitus with diabetic chronic kidney disease: Secondary | ICD-10-CM | POA: Diagnosis not present

## 2017-05-26 DIAGNOSIS — M069 Rheumatoid arthritis, unspecified: Secondary | ICD-10-CM | POA: Insufficient documentation

## 2017-05-26 DIAGNOSIS — G4733 Obstructive sleep apnea (adult) (pediatric): Secondary | ICD-10-CM | POA: Insufficient documentation

## 2017-05-26 DIAGNOSIS — Z7952 Long term (current) use of systemic steroids: Secondary | ICD-10-CM | POA: Insufficient documentation

## 2017-05-26 DIAGNOSIS — Z9861 Coronary angioplasty status: Secondary | ICD-10-CM | POA: Diagnosis not present

## 2017-05-26 DIAGNOSIS — I2510 Atherosclerotic heart disease of native coronary artery without angina pectoris: Secondary | ICD-10-CM | POA: Diagnosis not present

## 2017-05-26 DIAGNOSIS — Z940 Kidney transplant status: Secondary | ICD-10-CM | POA: Insufficient documentation

## 2017-05-26 DIAGNOSIS — I252 Old myocardial infarction: Secondary | ICD-10-CM | POA: Insufficient documentation

## 2017-05-26 DIAGNOSIS — J449 Chronic obstructive pulmonary disease, unspecified: Secondary | ICD-10-CM | POA: Diagnosis not present

## 2017-05-26 DIAGNOSIS — E669 Obesity, unspecified: Secondary | ICD-10-CM | POA: Diagnosis not present

## 2017-05-26 DIAGNOSIS — I120 Hypertensive chronic kidney disease with stage 5 chronic kidney disease or end stage renal disease: Secondary | ICD-10-CM | POA: Insufficient documentation

## 2017-05-26 DIAGNOSIS — M797 Fibromyalgia: Secondary | ICD-10-CM | POA: Insufficient documentation

## 2017-05-26 DIAGNOSIS — Z951 Presence of aortocoronary bypass graft: Secondary | ICD-10-CM | POA: Diagnosis not present

## 2017-05-26 DIAGNOSIS — I480 Paroxysmal atrial fibrillation: Secondary | ICD-10-CM | POA: Diagnosis not present

## 2017-05-26 DIAGNOSIS — Z9049 Acquired absence of other specified parts of digestive tract: Secondary | ICD-10-CM | POA: Insufficient documentation

## 2017-05-26 DIAGNOSIS — Z8614 Personal history of Methicillin resistant Staphylococcus aureus infection: Secondary | ICD-10-CM | POA: Insufficient documentation

## 2017-05-26 DIAGNOSIS — K219 Gastro-esophageal reflux disease without esophagitis: Secondary | ICD-10-CM | POA: Insufficient documentation

## 2017-05-26 DIAGNOSIS — M47816 Spondylosis without myelopathy or radiculopathy, lumbar region: Secondary | ICD-10-CM

## 2017-05-26 DIAGNOSIS — Z800 Family history of malignant neoplasm of digestive organs: Secondary | ICD-10-CM | POA: Insufficient documentation

## 2017-05-26 DIAGNOSIS — Z8249 Family history of ischemic heart disease and other diseases of the circulatory system: Secondary | ICD-10-CM | POA: Insufficient documentation

## 2017-05-26 MED ORDER — OXYCODONE HCL 5 MG PO TABS: 5.0000 mg | ORAL_TABLET | Freq: Three times a day (TID) | ORAL | 0 refills | Status: DC | PRN

## 2017-05-26 NOTE — Patient Instructions (Signed)
PLEASE FEEL FREE TO CALL OUR OFFICE WITH ANY PROBLEMS OR QUESTIONS ([phone])    LET ME KNOW IF YOU WOULD LIKE ME TO ORDER AN MRI

## 2017-05-26 NOTE — Progress Notes (Signed)
Subjective:    Patient ID: Katherine [name], female    DOB: 02-04-1956, 61 y.o.   MRN: [id number]  HPI   Katherine [name] is here in follow up of her chronic pain. She was [facility] last week and had an SPO and removal of mass on intestines. She is awaiting path on the intestinal mass. She continues to be quite sore from the surgery but is getting around with her cane.   She has noticed her right leg and hip are bothering her more with walking/weight bearing. She has seen orthopedics by referral who found spondylosis and DDD.   She remains on oxycodone for pain relief. This does continue to help. Her bowels are moving post-op.    Pain Inventory Average Pain 7 Pain Right Now 7 My pain is sharp, burning and aching  In the last 24 hours, has pain interfered with the following? General activity 7 Relation with others 8 Enjoyment of life 8 What TIME of day is your pain at its worst? night Sleep (in general) Fair  Pain is worse with: walking, bending, standing and some activites Pain improves with: rest, heat/ice and medication Relief from Meds: 10  Mobility use a cane ability to climb steps?  yes do you drive?  yes  Function disabled: date disabled 1989  Neuro/Psych weakness numbness trouble walking depression anxiety  Prior Studies Any changes since last visit?  no  Physicians involved in your care Any changes since last visit?  no   Family History  Problem Relation Age of Onset  . Cancer Mother        liver  . Heart disease Father   . Cancer Father        colon  . Arrhythmia Brother        Atrial Fibrillation  . Arrhythmia Paternal Aunt        Atrial Fibrillation   Social History   Social History  . Marital status: Married    Spouse name: N/A  . Number of children: N/A  . Years of education: N/A   Social History Main Topics  . Smoking status: Former Smoker    Packs/day: 1.00    Years: 30.00    Types: Cigarettes    Quit date: 08/17/2002  . Smokeless  tobacco: Never Used  . Alcohol use No  . Drug use: No  . Sexual activity: Not Asked   Other Topics Concern  . None   Social History Narrative   She is currently married, and the caregiver of her husband who is recovering from surgery for tongue cancer now diagnosed with lung cancer. Prior to his diagnosis of her husband, she actually had adopted a 21-year-old child who she knows caring for as well. With all the surgeries, they have been quite financially troubled. Thanks the help of her community and church, they have been able to stay "alfoat."     She is a former smoker who quit in 2004 after a 30-pack-year history.   She is active chasing a 96-year-old child, does not do routine exercise. She's been quite depressed with the condition of her husband, and admits to eating comfort herself.   She does not drink alcohol.   Past Surgical History:  Procedure Laterality Date  . CARDIAC CATHETERIZATION  5/'01, 3/'02, 8/'03, 10/'04; 1/'15   08/22/2013: LAD & RCA 100%; LIMA-LAD & SVG-rPDA patent; Cx-- OM1 60%, OM2 ostial ~50%; SVG-D1 - 80% mid, 50% distal ISR --PCI  . CARDIAC CATHETERIZATION  5/'01, 3/'02, 8/'03, 10/'04;  1/'15   08/22/2013: LAD & RCA 100%; LIMA-LAD & SVG-rPDA patent; Cx-- OM1 60%, OM2 ostial ~50%; SVG-D1 - 80% mid, 50% distal ISR --PCI  . CATHETER REMOVAL    . CHOLECYSTECTOMY N/A 10/29/2014   Procedure: LAPAROSCOPIC CHOLECYSTECTOMY WITH INTRAOPERATIVE CHOLANGIOGRAM;  Surgeon: Excell Seltzer, MD;  Location: [facility];  Service: General;  Laterality: N/A;  . CORONARY ANGIOPLASTY  1994   x5  . CORONARY ARTERY BYPASS GRAFT  1995   LIMA-LAD, SVG-RPDA, SVG-D1  . ESOPHAGOGASTRODUODENOSCOPY N/A 10/15/2016   Procedure: ESOPHAGOGASTRODUODENOSCOPY (EGD);  Surgeon: Wilford Corner, MD;  Location: [facility] ENDOSCOPY;  Service: Endoscopy;  Laterality: N/A;  . INCISE AND DRAIN ABCESS    . KIDNEY TRANSPLANT  1991  . LAPAROSCOPIC GASTRIC BANDING  04/2004; 10/'09, 2/'10   Port Replacement x 2  . LEFT HEART  CATHETERIZATION WITH CORONARY/GRAFT ANGIOGRAM N/A 08/23/2013   Procedure: LEFT HEART CATHETERIZATION WITH Beatrix Fetters;  Surgeon: Wellington Hampshire, MD;  Location: [facility] CATH LAB;  Service: Cardiovascular;  Laterality: N/A;  . Lower Extremity Arterial Dopplers  08/2013   ABI: R 0.96, L 1.04  . MULTIPLE TOOTH EXTRACTIONS  age 61  . NM MYOVIEW LTD  03/2016   EF 62%. LOW RISK. C/W prior MI - no Ischemia. Apical hypokinesis.  [name] Kitchen PERCUTANEOUS CORONARY STENT INTERVENTION (PCI-S)  5/'01, 3/'02, 8/'03, 10/'04;   '01 - S660 BMS 2.5 x 9 - dSVG-D1 into D1; '02- post-stent stenosis - 2.5 x 8 Pixel BMS; '8\03: ISR/Thrombosis into native D1 - AngioJet, 2.5 x 13 Pixel; '04 - ISR 95% - covered stented area with Taxus DES 2.5 mm x 20 (2.88)  . PERCUTANEOUS CORONARY STENT INTERVENTION (PCI-S)  08/22/2013   mid SVG-D1 80%; distal stent ~50% ISR; Promus Prermier DES 2.75 mm xc 20 mm (2.8 mm)  . PERCUTANEOUS CORONARY STENT INTERVENTION (PCI-S)  08/23/2013   Procedure: PERCUTANEOUS CORONARY STENT INTERVENTION (PCI-S);  Surgeon: Wellington Hampshire, MD;  Location: [facility] CATH LAB;  Service: Cardiovascular;;  . PORT-A-CATH REMOVAL     kidney  . TRANSTHORACIC ECHOCARDIOGRAM  02/2016   EF 55-60%. Septal dyssynergy from CABG GR 2 DD. Aortic valve is trileaflet the functional bicuspid with sclerosis but not yet notable for stenosis stenosis.; no Significant change  . TUBAL LIGATION    . wrist fistula repair Left    dialysis for one year   Past Medical History:  Diagnosis Date  . Anxiety   . Bilateral carotid artery stenosis    Carotid duplex 04/3789: 2-40% LICA, 97-35% RICA, >32% RECA, f/u 1 yr suggested  . CAD (coronary artery disease) of bypass graft 5/01; 3/'02, 8/'03, 10/'04; 1/15   PCI x 5 to SVG-D1   . CAD in native artery 07/1993    3 Vessel Disease (LAD-D1 & RCA) -- CABG  . CAD S/P percutaneous coronary angioplasty    PCI to SVG-D1 insertion/native D1 x 4 = '01 -(S660 BMS 2.5 x 9 - insertion into D1; '02 - distal  overlap ACS Pixel 2.5 x 8  BMS; '03 distal/native ISR/Thrombosis - Pixel 2.5 x 13; '04 - ISR-  Taxus 2.5 x 20 (covered all);; 1/15 - mid SVG-D1 (50% distal ISR) - Promus P 2.75 x 20 -- 2.8 mm  . COPD mixed type ([place])    Followed by Dr. Lamonte Sakai "pulmonologist said no COPD"  . Depression with anxiety   . Diabetes mellitus type 2 in obese ([place])   . Dyslipidemia, goal LDL below 70    08/2012: TC 137, TG 200, HDL 32!, LDL  35; on statin (followed by Dr.Deterding)  . ESRD (end stage renal disease) ([place]) 1991   s/p Cadaveric Renal Transplant [facility] - Dr. Jimmy Footman)   . Family history of adverse reaction to anesthesia    mom's bp dropped during/after anesthesia  . Fibromyalgia   . GERD (gastroesophageal reflux disease)   . Glomerulonephritis, chronic, rapidly progressive 84  . 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 07/1993   Rescue PTCA of RCA -- referred for CABG.  . H/O: GI bleed   . Hypertension associated with diabetes ([place])   . MRSA (methicillin resistant staph aureus) culture positive   . Obesity    s/p Lap Band 04/2004- Port Replacement 10/09 & 2/10 for infection; Dr. Excell Seltzer.  . OSA (obstructive sleep apnea)    no longer on CPAP or home O2  . PAD (peripheral artery disease) ([place]) 08/2013   LEA Dopplers to be read by Dr. Fletcher Anon  . PAF (paroxysmal atrial fibrillation) ([place]) 06/2014   Noted on CardioNet Monitor  - rate ~112; no recurrent symptoms. Nonionic regulation because of frequent GI procedures. Patient prefers Plavix  . Recurrent boils    Bilateral Groin  . Rheumatoid arthritis ([place])    Per Patient Report; associated with OA  . 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 Servando Snare: LIMA-LAD, SVG-bifurcatingD1, SVG-rPDA  . S/p cadaver renal transplant [place]  . Stroke [facility]) 2012   "right eye stroke- half blind now"  . Unstable angina ([place]) 5/01; 3/'02, 8/'03, 10/'04; 1/15   x 5 occurences since Inf-Post STEMI in 1994   There were no vitals taken for this  visit.  Opioid Risk Score:   Fall Risk Score:  `1  Depression screen PHQ 2/9  Depression screen [facility] 2/9 03/29/2017 05/19/2016 03/30/2016  Decreased Interest 3 3 3   Down, Depressed, Hopeless 3 3 3   PHQ - 2 Score 6 6 6   Altered sleeping - - 3  Tired, decreased energy - - 3  Change in appetite - - 2  Feeling bad or failure about yourself  - - 3  Trouble concentrating - - 2  Moving slowly or fidgety/restless - - 0  Suicidal thoughts - - 0  PHQ-9 Score - - 19  Some recent data might be hidden     Review of Systems  Constitutional: Positive for unexpected weight change.  HENT: Negative.   Eyes: Negative.   Respiratory: Negative.   Cardiovascular: Negative.   Gastrointestinal: Negative.   Endocrine: Negative.   Genitourinary: Negative.   Musculoskeletal: Negative.   Skin: Negative.   Allergic/Immunologic: Negative.   Neurological: Negative.   Hematological: Bruises/bleeds easily.  Psychiatric/Behavioral: Negative.   All other systems reviewed and are negative.      Objective:   Physical Exam  General: Alert and oriented x 3, No apparent distress. She remains overweight HEENT:PERRL, EOMI Neck:supple Heart:RRR Chest:CTA B Abdomen:Soft, non-tender, non-distended, bowel sounds positive. Extremities:pulses2+ Skin:Clean and intact without signs of breakdown. Has dry skin in places, diffuse lesions on lgs Neuro:CN normal. Motor 5/5. No sensory loss Musculoskeletal:right shoulder with improved pain withIR/ER/ABD. Impingement sign not as substantial. .  She has no gross rheumatoid deformities in the hands or feet. low back tender especially in RLQ with palpation. Flexion triggered pain as well as facet maneuver and lateral bending. SST was equivocal with hamstring tightness. SLR the same. Both hamstrings were tight with lumbar flexion.  Psych:Pt's affect is appropriate. Pt is cooperative       Assessment & Plan:  1. Fibromyalgia 2. Rheumatoid  Arthritis by history 3. Osteoarthritis--polyarticular 4. ESRD with hx of renal transplant on chronic steroids 5. Right rotator cuff tendonitis/like degenerative/inflammatory changes in the right shoulder also.  6. Hx o falls, gait disorder 7. Right low back pain with radicular symptoms. ?spondylosis per recent plain film   Plan: 1. Continues to be a fall risk. 2. oxycodone 5mg  q8 prn, #60--We will continue the opioid monitoring program, this consists of regular clinic visits, examinations, routine drug screening, pill counts as well as use of [place] Controlled Substance Reporting System. [place].   3. Continuecymbalta to 60mg  daily, continue zoloft at 100mg  as well for now. Would still like to weanzoloft to offmoving forward given high risk of serotonin syndrome.  4. Continue tegretol200mg  BID for FMS/neuropathic pain 6. Reviewed legs/ bruises--still present but controlled 7. Would like for her to see Dr. Sima Matas regarding pain/coping skills given her multiple losses and chronic pain. 8. I would be happy to refer for MRI of her low back given her increased low back pain and radicular symptoms. She will discuss with orthopedics also     15 minutes of face to face patient care time were spent during this visit. All questions were encouraged and answered.  Follow up with NP in about 2 months.

## 2017-06-14 ENCOUNTER — Telehealth: Payer: Self-pay | Admitting: Emergency Medicine

## 2017-06-14 ENCOUNTER — Other Ambulatory Visit: Payer: Self-pay | Admitting: Emergency Medicine

## 2017-06-14 MED ORDER — PREDNISONE 10 MG PO TABS: ORAL_TABLET | ORAL | 0 refills | Status: DC

## 2017-06-14 MED ORDER — AZITHROMYCIN 250 MG PO TABS: ORAL_TABLET | ORAL | 0 refills | Status: AC

## 2017-06-14 NOTE — Telephone Encounter (Signed)
Spoke with the pt  She is c/o increased SOB "all the time", wheezing, chest tightness and cough with yellow sputum- onset 1-2 wks ago  She refused appt  She just had back surgery approx 3 wks ago  Please advise thanks

## 2017-06-14 NOTE — Telephone Encounter (Signed)
Spoke with pt, and made her aware of RB's recommendations. Rx has been sent to preferred pharmacy.  Pt has been scheduled for visit with RB on 06/18/17. zpak showed allergy to Erythromycin. Pt states she has taking Zpak previously with no reaction.  Pt did make to make RB aware that during recovery, she felt that her lungs were burning when inhaling.   Will route to RB as a FYI.

## 2017-06-14 NOTE — Telephone Encounter (Signed)
She needs to be seen. There are several things post-op back surgery that could result in dyspnea: PE, etc   While we get her in, OK with me for her to start pred taper > Take 40mg  daily for 3 days, then 30mg  daily for 3 days, then 20mg  daily for 3 days, then 10mg  daily for 3 days, then stop azithro > z-pack  If she becomes acutely SOB or has CP then go to the ED

## 2017-06-17 ENCOUNTER — Other Ambulatory Visit: Payer: Self-pay | Admitting: *Deleted

## 2017-06-17 MED ORDER — FLUTICASONE PROPIONATE 50 MCG/ACT NA SUSP: NASAL | 1 refills | Status: DC

## 2017-06-18 ENCOUNTER — Ambulatory Visit (INDEPENDENT_AMBULATORY_CARE_PROVIDER_SITE_OTHER): Payer: Medicare Other | Admitting: Emergency Medicine

## 2017-06-18 ENCOUNTER — Encounter: Payer: Self-pay | Admitting: Emergency Medicine

## 2017-06-18 ENCOUNTER — Ambulatory Visit (INDEPENDENT_AMBULATORY_CARE_PROVIDER_SITE_OTHER)
Admission: RE | Admit: 2017-06-18 | Discharge: 2017-06-18 | Disposition: A | Discharge status: Home / Self Care | Payer: Medicare Other | Source: Ambulatory Visit | Attending: Emergency Medicine | Admitting: Emergency Medicine

## 2017-06-18 DIAGNOSIS — R918 Other nonspecific abnormal finding of lung field: Secondary | ICD-10-CM | POA: Diagnosis not present

## 2017-06-18 DIAGNOSIS — R0600 Dyspnea, unspecified: Secondary | ICD-10-CM | POA: Diagnosis not present

## 2017-06-18 DIAGNOSIS — G4730 Sleep apnea, unspecified: Secondary | ICD-10-CM

## 2017-06-18 DIAGNOSIS — Z9861 Coronary angioplasty status: Secondary | ICD-10-CM | POA: Diagnosis not present

## 2017-06-18 DIAGNOSIS — I2510 Atherosclerotic heart disease of native coronary artery without angina pectoris: Secondary | ICD-10-CM

## 2017-06-18 DIAGNOSIS — J45909 Unspecified asthma, uncomplicated: Secondary | ICD-10-CM | POA: Diagnosis not present

## 2017-06-18 DIAGNOSIS — R0602 Shortness of breath: Secondary | ICD-10-CM | POA: Insufficient documentation

## 2017-06-18 NOTE — Assessment & Plan Note (Signed)
Continue Symbicort, albuterol as needed 

## 2017-06-18 NOTE — Patient Instructions (Addendum)
Please continue your Symbicort as you are taking it Take albuterol 2 puffs up to every 4 hours if needed for shortness of breath.  Finish your prednisone taper as ordered.  Please increase pepcid to twice a day Some of your shortness of breath is likely related to your weight gain. Agree with working with Dr Excell Seltzer regarding your lap-band.  We will repeat your CT scan of the chest to insure stable.  Follow with Dr Lamonte Sakai in 3 months or sooner if you have any problems.

## 2017-06-18 NOTE — Progress Notes (Signed)
61 year old female patient has a known history of obesity, status post lap band surgery with significant weight loss. Patient also has a history of renal transplant on chronic immunosuppression with Imuran and prednisone. The patient has a history of sleep apnea now much improved after significant weight loss.  Still needs repeat PSG to prove that OSA has resolved. She has stopped using CPAP.   Acute OV 06/18/17 --patient is 61 years old, has a history of lap band surgery with associated GERD, former obstructive sleep apnea no longer on CPAP, upper airway irritation syndrome with associated cough and probably also some true obstructive lung disease/asthma.  She had pulmonary nodules noted on CT scan from 10/14/16, apparent resolution by chest x-ray 12/28/16.  Suspected to be infectious in nature, some question of possible chronic aspiration. She just had a benign mass removed surgically, B salpingo-oophorectomy done 9/28. She feels that her chest has been more tight since the procedure. I gave her prednisone last week, seems to be helping some but not at baseline. She has gained wt since saline removed from her lap band, has gained. She is hearing wheeze at night.  She is on symbicort, using albuterol about 2x a day currently. She is on   PE :  Vitals:   06/18/17 1116  BP: 140/90  Pulse: 82  SpO2: 97%  Weight: 228 lb (103.4 kg)  Height: 5\' 1"  (1.549 m)   Gen: Pleasant, obese, has gained wt, in no distress,  normal affect  ENT post pharynx erythema, no purulence  Neck: No JVD, no TMG, no carotid bruits  Lungs: No use of accessory muscles, no dullness to percussion, clear without rales or rhonchi  Cardiovascular: RRR, heart sounds normal, no murmur or gallops, no peripheral edema  Musculoskeletal: No deformities, no cyanosis or clubbing  Neuro: alert, non focal  Skin: Warm, no lesions or rashes  Sleep apnea No longer on CPAP  Lung nodules Micronodular disease noted 09/2016.  Appear to be  cleared in May by chest x-ray.  She is at risk for opportunistic infection, chronic aspiration.  I believe she needs a repeat CT given her subacute progression of dyspnea.  Dyspnea Subacute progression of dyspnea more noticeable since her abdominal surgery in September.  Suspect that her weight gain is a part of this.  She is gained 50 pounds since February.  Consider some deconditioning postoperatively.  She is not wheezing on exam today, no active bronchospasm but she is on a prednisone taper.  I want her to finish this.  No other findings to suggest pulmonary embolism or a postoperative complication  Extrinsic asthma Continue Symbicort, albuterol as needed   Baltazar Apo, MD, PhD 06/18/2017, 11:51 AM [facility] [phone] or if no answer [phone]

## 2017-06-18 NOTE — Assessment & Plan Note (Addendum)
-

## 2017-06-18 NOTE — Assessment & Plan Note (Signed)
Micronodular disease noted 09/2016.  Appear to be cleared in May by chest x-ray.  She is at risk for opportunistic infection, chronic aspiration.  I believe she needs a repeat CT given her subacute progression of dyspnea.

## 2017-06-18 NOTE — Assessment & Plan Note (Addendum)
Subacute progression of dyspnea more noticeable since her abdominal surgery in September.  Suspect that her weight gain is a part of this.  She is gained 50 pounds since February.  Consider some deconditioning postoperatively.  She is not wheezing on exam today, no active bronchospasm but she is on a prednisone taper.  I want her to finish this.  No other findings to suggest pulmonary embolism or a postoperative complication

## 2017-06-20 ENCOUNTER — Other Ambulatory Visit: Payer: Self-pay | Admitting: Emergency Medicine

## 2017-06-25 ENCOUNTER — Telehealth: Payer: Self-pay | Admitting: Emergency Medicine

## 2017-06-25 NOTE — Telephone Encounter (Signed)
Spoke with patient. Advised her that we have received her message in regards to the CT results. However, RB has not reviewed yet. Advised her that I will send a message to RB to have him review these. She verbalized understanding.    RB, please advise on patient's results. Thanks!

## 2017-06-28 ENCOUNTER — Other Ambulatory Visit: Payer: Self-pay | Admitting: Physical Medicine & Rehabilitation

## 2017-06-28 DIAGNOSIS — M47816 Spondylosis without myelopathy or radiculopathy, lumbar region: Secondary | ICD-10-CM

## 2017-06-28 DIAGNOSIS — M797 Fibromyalgia: Secondary | ICD-10-CM

## 2017-06-29 NOTE — Telephone Encounter (Signed)
Spoke with pt. She is aware of results. Nothing further was needed.  

## 2017-06-29 NOTE — Telephone Encounter (Signed)
Please let her know that I have reviewed her CT chest. The nodular areas that we were following have all resolved. This is good news.

## 2017-07-02 ENCOUNTER — Encounter: Payer: Medicare Other | Admitting: Psychology

## 2017-07-06 ENCOUNTER — Other Ambulatory Visit: Payer: Self-pay | Admitting: *Deleted

## 2017-07-06 MED ORDER — METOPROLOL TARTRATE 25 MG PO TABS: ORAL_TABLET | ORAL | 0 refills | Status: DC

## 2017-07-13 DIAGNOSIS — R399 Unspecified symptoms and signs involving the genitourinary system: Secondary | ICD-10-CM | POA: Diagnosis not present

## 2017-07-14 ENCOUNTER — Other Ambulatory Visit: Payer: Self-pay | Admitting: Physical Medicine & Rehabilitation

## 2017-07-14 DIAGNOSIS — M47816 Spondylosis without myelopathy or radiculopathy, lumbar region: Secondary | ICD-10-CM

## 2017-07-20 ENCOUNTER — Other Ambulatory Visit: Payer: Self-pay | Admitting: *Deleted

## 2017-07-20 MED ORDER — MONTELUKAST SODIUM 10 MG PO TABS: ORAL_TABLET | ORAL | 1 refills | Status: DC

## 2017-07-27 ENCOUNTER — Encounter: Payer: Medicare Other | Attending: Registered Nurse | Admitting: Physical Medicine & Rehabilitation

## 2017-07-27 ENCOUNTER — Other Ambulatory Visit: Payer: Self-pay | Admitting: Physical Medicine & Rehabilitation

## 2017-07-27 DIAGNOSIS — J449 Chronic obstructive pulmonary disease, unspecified: Secondary | ICD-10-CM | POA: Insufficient documentation

## 2017-07-27 DIAGNOSIS — N186 End stage renal disease: Secondary | ICD-10-CM | POA: Insufficient documentation

## 2017-07-27 DIAGNOSIS — Z940 Kidney transplant status: Secondary | ICD-10-CM | POA: Insufficient documentation

## 2017-07-27 DIAGNOSIS — Z8249 Family history of ischemic heart disease and other diseases of the circulatory system: Secondary | ICD-10-CM | POA: Insufficient documentation

## 2017-07-27 DIAGNOSIS — G4733 Obstructive sleep apnea (adult) (pediatric): Secondary | ICD-10-CM | POA: Insufficient documentation

## 2017-07-27 DIAGNOSIS — I2510 Atherosclerotic heart disease of native coronary artery without angina pectoris: Secondary | ICD-10-CM | POA: Insufficient documentation

## 2017-07-27 DIAGNOSIS — M47816 Spondylosis without myelopathy or radiculopathy, lumbar region: Secondary | ICD-10-CM

## 2017-07-27 DIAGNOSIS — Z800 Family history of malignant neoplasm of digestive organs: Secondary | ICD-10-CM | POA: Insufficient documentation

## 2017-07-27 DIAGNOSIS — Z951 Presence of aortocoronary bypass graft: Secondary | ICD-10-CM | POA: Insufficient documentation

## 2017-07-27 DIAGNOSIS — Z9884 Bariatric surgery status: Secondary | ICD-10-CM | POA: Insufficient documentation

## 2017-07-27 DIAGNOSIS — I480 Paroxysmal atrial fibrillation: Secondary | ICD-10-CM | POA: Insufficient documentation

## 2017-07-27 DIAGNOSIS — Z9049 Acquired absence of other specified parts of digestive tract: Secondary | ICD-10-CM | POA: Insufficient documentation

## 2017-07-27 DIAGNOSIS — D631 Anemia in chronic kidney disease: Secondary | ICD-10-CM | POA: Insufficient documentation

## 2017-07-27 DIAGNOSIS — M797 Fibromyalgia: Secondary | ICD-10-CM | POA: Insufficient documentation

## 2017-07-27 DIAGNOSIS — Z7952 Long term (current) use of systemic steroids: Secondary | ICD-10-CM | POA: Insufficient documentation

## 2017-07-27 DIAGNOSIS — E669 Obesity, unspecified: Secondary | ICD-10-CM | POA: Insufficient documentation

## 2017-07-27 DIAGNOSIS — I6523 Occlusion and stenosis of bilateral carotid arteries: Secondary | ICD-10-CM | POA: Insufficient documentation

## 2017-07-27 DIAGNOSIS — Z8614 Personal history of Methicillin resistant Staphylococcus aureus infection: Secondary | ICD-10-CM | POA: Insufficient documentation

## 2017-07-27 DIAGNOSIS — K219 Gastro-esophageal reflux disease without esophagitis: Secondary | ICD-10-CM | POA: Insufficient documentation

## 2017-07-27 DIAGNOSIS — E785 Hyperlipidemia, unspecified: Secondary | ICD-10-CM | POA: Insufficient documentation

## 2017-07-27 DIAGNOSIS — Z8673 Personal history of transient ischemic attack (TIA), and cerebral infarction without residual deficits: Secondary | ICD-10-CM | POA: Insufficient documentation

## 2017-07-27 DIAGNOSIS — I120 Hypertensive chronic kidney disease with stage 5 chronic kidney disease or end stage renal disease: Secondary | ICD-10-CM | POA: Insufficient documentation

## 2017-07-27 DIAGNOSIS — Z955 Presence of coronary angioplasty implant and graft: Secondary | ICD-10-CM | POA: Insufficient documentation

## 2017-07-27 DIAGNOSIS — E1122 Type 2 diabetes mellitus with diabetic chronic kidney disease: Secondary | ICD-10-CM | POA: Insufficient documentation

## 2017-07-27 DIAGNOSIS — Z808 Family history of malignant neoplasm of other organs or systems: Secondary | ICD-10-CM | POA: Insufficient documentation

## 2017-07-27 DIAGNOSIS — I252 Old myocardial infarction: Secondary | ICD-10-CM | POA: Insufficient documentation

## 2017-07-27 DIAGNOSIS — M069 Rheumatoid arthritis, unspecified: Secondary | ICD-10-CM | POA: Insufficient documentation

## 2017-07-27 DIAGNOSIS — Z87891 Personal history of nicotine dependence: Secondary | ICD-10-CM | POA: Insufficient documentation

## 2017-07-27 DIAGNOSIS — Z9851 Tubal ligation status: Secondary | ICD-10-CM | POA: Insufficient documentation

## 2017-07-27 DIAGNOSIS — Z992 Dependence on renal dialysis: Secondary | ICD-10-CM | POA: Insufficient documentation

## 2017-07-28 ENCOUNTER — Other Ambulatory Visit: Payer: Self-pay | Admitting: *Deleted

## 2017-07-28 MED ORDER — ROSUVASTATIN CALCIUM 20 MG PO TABS: 20.0000 mg | ORAL_TABLET | Freq: Every day | ORAL | 0 refills | Status: DC

## 2017-07-29 ENCOUNTER — Other Ambulatory Visit: Payer: Self-pay | Admitting: *Deleted

## 2017-07-29 MED ORDER — SYMBICORT 160-4.5 MCG/ACT IN AERO: 1.0000 | INHALATION_SPRAY | Freq: Two times a day (BID) | RESPIRATORY_TRACT | 1 refills | Status: DC

## 2017-08-02 ENCOUNTER — Encounter: Payer: Self-pay | Admitting: Physical Medicine & Rehabilitation

## 2017-08-02 ENCOUNTER — Encounter (HOSPITAL_BASED_OUTPATIENT_CLINIC_OR_DEPARTMENT_OTHER): Payer: Medicare Other | Admitting: Physical Medicine & Rehabilitation

## 2017-08-02 VITALS — BP 127/70 | HR 73

## 2017-08-02 DIAGNOSIS — M47816 Spondylosis without myelopathy or radiculopathy, lumbar region: Secondary | ICD-10-CM | POA: Diagnosis not present

## 2017-08-02 DIAGNOSIS — I2510 Atherosclerotic heart disease of native coronary artery without angina pectoris: Secondary | ICD-10-CM

## 2017-08-02 DIAGNOSIS — M170 Bilateral primary osteoarthritis of knee: Secondary | ICD-10-CM | POA: Diagnosis not present

## 2017-08-02 DIAGNOSIS — I6523 Occlusion and stenosis of bilateral carotid arteries: Secondary | ICD-10-CM | POA: Diagnosis not present

## 2017-08-02 DIAGNOSIS — Z8673 Personal history of transient ischemic attack (TIA), and cerebral infarction without residual deficits: Secondary | ICD-10-CM | POA: Diagnosis not present

## 2017-08-02 DIAGNOSIS — M1712 Unilateral primary osteoarthritis, left knee: Secondary | ICD-10-CM

## 2017-08-02 DIAGNOSIS — Z7952 Long term (current) use of systemic steroids: Secondary | ICD-10-CM | POA: Diagnosis not present

## 2017-08-02 DIAGNOSIS — Z9861 Coronary angioplasty status: Secondary | ICD-10-CM

## 2017-08-02 DIAGNOSIS — K219 Gastro-esophageal reflux disease without esophagitis: Secondary | ICD-10-CM | POA: Diagnosis not present

## 2017-08-02 DIAGNOSIS — E669 Obesity, unspecified: Secondary | ICD-10-CM | POA: Diagnosis not present

## 2017-08-02 DIAGNOSIS — Z951 Presence of aortocoronary bypass graft: Secondary | ICD-10-CM | POA: Diagnosis not present

## 2017-08-02 DIAGNOSIS — Z955 Presence of coronary angioplasty implant and graft: Secondary | ICD-10-CM | POA: Diagnosis not present

## 2017-08-02 DIAGNOSIS — Z9851 Tubal ligation status: Secondary | ICD-10-CM | POA: Diagnosis not present

## 2017-08-02 DIAGNOSIS — I252 Old myocardial infarction: Secondary | ICD-10-CM | POA: Diagnosis not present

## 2017-08-02 DIAGNOSIS — E1122 Type 2 diabetes mellitus with diabetic chronic kidney disease: Secondary | ICD-10-CM | POA: Diagnosis not present

## 2017-08-02 DIAGNOSIS — D631 Anemia in chronic kidney disease: Secondary | ICD-10-CM | POA: Diagnosis not present

## 2017-08-02 DIAGNOSIS — M069 Rheumatoid arthritis, unspecified: Secondary | ICD-10-CM | POA: Diagnosis not present

## 2017-08-02 DIAGNOSIS — N186 End stage renal disease: Secondary | ICD-10-CM | POA: Diagnosis not present

## 2017-08-02 DIAGNOSIS — E785 Hyperlipidemia, unspecified: Secondary | ICD-10-CM | POA: Diagnosis not present

## 2017-08-02 DIAGNOSIS — Z992 Dependence on renal dialysis: Secondary | ICD-10-CM | POA: Diagnosis not present

## 2017-08-02 DIAGNOSIS — G894 Chronic pain syndrome: Secondary | ICD-10-CM | POA: Diagnosis not present

## 2017-08-02 DIAGNOSIS — I120 Hypertensive chronic kidney disease with stage 5 chronic kidney disease or end stage renal disease: Secondary | ICD-10-CM | POA: Diagnosis not present

## 2017-08-02 DIAGNOSIS — M797 Fibromyalgia: Secondary | ICD-10-CM

## 2017-08-02 DIAGNOSIS — J449 Chronic obstructive pulmonary disease, unspecified: Secondary | ICD-10-CM | POA: Diagnosis not present

## 2017-08-02 DIAGNOSIS — G4733 Obstructive sleep apnea (adult) (pediatric): Secondary | ICD-10-CM | POA: Diagnosis not present

## 2017-08-02 DIAGNOSIS — Z87891 Personal history of nicotine dependence: Secondary | ICD-10-CM | POA: Diagnosis not present

## 2017-08-02 DIAGNOSIS — Z9884 Bariatric surgery status: Secondary | ICD-10-CM | POA: Diagnosis not present

## 2017-08-02 DIAGNOSIS — I480 Paroxysmal atrial fibrillation: Secondary | ICD-10-CM | POA: Diagnosis not present

## 2017-08-02 DIAGNOSIS — Z940 Kidney transplant status: Secondary | ICD-10-CM | POA: Diagnosis not present

## 2017-08-02 MED ORDER — OXYCODONE HCL 5 MG PO TABS: 5.0000 mg | ORAL_TABLET | Freq: Three times a day (TID) | ORAL | 0 refills | Status: DC | PRN

## 2017-08-02 NOTE — Progress Notes (Signed)
Subjective:    Patient ID: Katherine [name], female    DOB: 1956-06-02, 61 y.o.   MRN: [id number]  HPI   Katherine [name] is here in follow up of her chronic pain. She has had more pain over the last couple months with the change in weather. Furthermore, she has been sick, and her husband has been ill as well (actually in the hospital.) She is having to take on more chores at home which is hard on her. She is also supposed to be performing community service for parking and vehicle violations she has accumulated.   Her left knee in particular has been giving her more problems.  She notes ongoing swelling and cracking with weightbearing as well as bending.  She struggles with any types of stairs.  She has a knee brace that she wears but its ill fitting and worn out.  Her low back continues to give her problems as well.  She has radiation of pain from the back into both legs past the knees.  Sometimes her feet are affected as well.  Bending and walking seem to cause the most pain for her but she has a hard time identifying particular activities which trigger pain.  She remains on oxycodone 5 mg every 8 hours as needed for pain control which provides her some relief.    Pain Inventory Average Pain 9 Pain Right Now 7 My pain is sharp, burning, stabbing and aching  In the last 24 hours, has pain interfered with the following? General activity 9 Relation with others 9 Enjoyment of life 9 What TIME of day is your pain at its worst? evening Sleep (in general) Fair  Pain is worse with: walking, standing and some activites Pain improves with: rest, heat/ice and medication Relief from Meds: 8  Mobility use a cane ability to climb steps?  yes do you drive?  yes  Function disabled: date disabled 1989  Neuro/Psych bowel control problems numbness trouble walking spasms dizziness depression anxiety  Prior Studies Any changes since last visit?  no  Physicians involved in your care Any  changes since last visit?  no   Family History  Problem Relation Age of Onset  . Cancer Mother        liver  . Heart disease Father   . Cancer Father        colon  . Arrhythmia Brother        Atrial Fibrillation  . Arrhythmia Paternal Aunt        Atrial Fibrillation   Social History   Socioeconomic History  . Marital status: Married    Spouse name: Not on file  . Number of children: Not on file  . Years of education: Not on file  . Highest education level: Not on file  Social Needs  . Financial resource strain: Not on file  . Food insecurity - worry: Not on file  . Food insecurity - inability: Not on file  . Transportation needs - medical: Not on file  . Transportation needs - non-medical: Not on file  Occupational History  . Not on file  Tobacco Use  . Smoking status: Former Smoker    Packs/day: 1.00    Years: 30.00    Pack years: 30.00    Types: Cigarettes    Last attempt to quit: 08/17/2002    Years since quitting: 14.9  . Smokeless tobacco: Never Used  Substance and Sexual Activity  . Alcohol use: No  . Drug use: No  .  Sexual activity: Not on file  Other Topics Concern  . Not on file  Social History Narrative   She is currently married, and the caregiver of her husband who is recovering from surgery for tongue cancer now diagnosed with lung cancer. Prior to his diagnosis of her husband, she actually had adopted a 13-year-old child who she knows caring for as well. With all the surgeries, they have been quite financially troubled. Thanks the help of her community and church, they have been able to stay "alfoat."     She is a former smoker who quit in 2004 after a 30-pack-year history.   She is active chasing a 37-year-old child, does not do routine exercise. She's been quite depressed with the condition of her husband, and admits to eating comfort herself.   She does not drink alcohol.   Past Surgical History:  Procedure Laterality Date  . CARDIAC CATHETERIZATION   5/'01, 3/'02, 8/'03, 10/'04; 1/'15   08/22/2013: LAD & RCA 100%; LIMA-LAD & SVG-rPDA patent; Cx-- OM1 60%, OM2 ostial ~50%; SVG-D1 - 80% mid, 50% distal ISR --PCI  . CARDIAC CATHETERIZATION  5/'01, 3/'02, 8/'03, 10/'04; 1/'15   08/22/2013: LAD & RCA 100%; LIMA-LAD & SVG-rPDA patent; Cx-- OM1 60%, OM2 ostial ~50%; SVG-D1 - 80% mid, 50% distal ISR --PCI  . CATHETER REMOVAL    . CHOLECYSTECTOMY N/A 10/29/2014   Procedure: LAPAROSCOPIC CHOLECYSTECTOMY WITH INTRAOPERATIVE CHOLANGIOGRAM;  Surgeon: Excell Seltzer, MD;  Location: [facility];  Service: General;  Laterality: N/A;  . CORONARY ANGIOPLASTY  1994   x5  . CORONARY ARTERY BYPASS GRAFT  1995   LIMA-LAD, SVG-RPDA, SVG-D1  . ESOPHAGOGASTRODUODENOSCOPY N/A 10/15/2016   Procedure: ESOPHAGOGASTRODUODENOSCOPY (EGD);  Surgeon: Wilford Corner, MD;  Location: [facility] ENDOSCOPY;  Service: Endoscopy;  Laterality: N/A;  . INCISE AND DRAIN ABCESS    . KIDNEY TRANSPLANT  1991  . LAPAROSCOPIC GASTRIC BANDING  04/2004; 10/'09, 2/'10   Port Replacement x 2  . LEFT HEART CATHETERIZATION WITH CORONARY/GRAFT ANGIOGRAM N/A 08/23/2013   Procedure: LEFT HEART CATHETERIZATION WITH Beatrix Fetters;  Surgeon: Wellington Hampshire, MD;  Location: [facility] CATH LAB;  Service: Cardiovascular;  Laterality: N/A;  . Lower Extremity Arterial Dopplers  08/2013   ABI: R 0.96, L 1.04  . MULTIPLE TOOTH EXTRACTIONS  age 33  . NM MYOVIEW LTD  03/2016   EF 62%. LOW RISK. C/W prior MI - no Ischemia. Apical hypokinesis.  [name] Kitchen PERCUTANEOUS CORONARY STENT INTERVENTION (PCI-S)  5/'01, 3/'02, 8/'03, 10/'04;   '01 - S660 BMS 2.5 x 9 - dSVG-D1 into D1; '02- post-stent stenosis - 2.5 x 8 Pixel BMS; '8\03: ISR/Thrombosis into native D1 - AngioJet, 2.5 x 13 Pixel; '04 - ISR 95% - covered stented area with Taxus DES 2.5 mm x 20 (2.88)  . PERCUTANEOUS CORONARY STENT INTERVENTION (PCI-S)  08/22/2013   mid SVG-D1 80%; distal stent ~50% ISR; Promus Prermier DES 2.75 mm xc 20 mm (2.8 mm)  . PERCUTANEOUS CORONARY  STENT INTERVENTION (PCI-S)  08/23/2013   Procedure: PERCUTANEOUS CORONARY STENT INTERVENTION (PCI-S);  Surgeon: Wellington Hampshire, MD;  Location: [facility] CATH LAB;  Service: Cardiovascular;;  . PORT-A-CATH REMOVAL     kidney  . TRANSTHORACIC ECHOCARDIOGRAM  02/2016   EF 55-60%. Septal dyssynergy from CABG GR 2 DD. Aortic valve is trileaflet the functional bicuspid with sclerosis but not yet notable for stenosis stenosis.; no Significant change  . TUBAL LIGATION    . wrist fistula repair Left    dialysis for one year  Past Medical History:  Diagnosis Date  . Anxiety   . Bilateral carotid artery stenosis    Carotid duplex 08/8561: 1-49% LICA, 70-26% RICA, >37% RECA, f/u 1 yr suggested  . CAD (coronary artery disease) of bypass graft 5/01; 3/'02, 8/'03, 10/'04; 1/15   PCI x 5 to SVG-D1   . CAD in native artery 07/1993    3 Vessel Disease (LAD-D1 & RCA) -- CABG  . CAD S/P percutaneous coronary angioplasty    PCI to SVG-D1 insertion/native D1 x 4 = '01 -(S660 BMS 2.5 x 9 - insertion into D1; '02 - distal overlap ACS Pixel 2.5 x 8  BMS; '03 distal/native ISR/Thrombosis - Pixel 2.5 x 13; '04 - ISR-  Taxus 2.5 x 20 (covered all);; 1/15 - mid SVG-D1 (50% distal ISR) - Promus P 2.75 x 20 -- 2.8 mm  . COPD mixed type ([place])    Followed by Dr. Lamonte Sakai "pulmonologist said no COPD"  . Depression with anxiety   . Diabetes mellitus type 2 in obese ([place])   . Dyslipidemia, goal LDL below 70    08/2012: TC 137, TG 200, HDL 32!, LDL 45; on statin (followed by Dr.Deterding)  . ESRD (end stage renal disease) ([place]) 1991   s/p [facility]al [facility] - Dr. Jimmy Footman)   . Family history of adverse reaction to anesthesia    mom's bp dropped during/after anesthesia  . Fibromyalgia   . GERD (gastroesophageal reflux disease)   . Glomerulonephritis, chronic, rapidly progressive 39  . 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 07/1993   Rescue PTCA of RCA -- referred for CABG.  . H/O:  GI bleed   . Hypertension associated with diabetes ([place])   . MRSA (methicillin resistant staph aureus) culture positive   . Obesity    s/p Lap Band 04/2004- Port Replacement 10/09 & 2/10 for infection; Dr. Excell Seltzer.  . OSA (obstructive sleep apnea)    no longer on CPAP or home O2  . PAD (peripheral artery disease) ([place]) 08/2013   LEA Dopplers to be read by Dr. Fletcher Anon  . PAF (paroxysmal atrial fibrillation) ([place]) 06/2014   Noted on CardioNet Monitor  - rate ~112; no recurrent symptoms. Nonionic regulation because of frequent GI procedures. Patient prefers Plavix  . Recurrent boils    Bilateral Groin  . Rheumatoid arthritis ([place])    Per Patient Report; associated with OA  . 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 Servando Snare: LIMA-LAD, SVG-bifurcatingD1, SVG-rPDA  . S/p cadaver renal transplant [place]  . Stroke [facility]) 2012   "right eye stroke- half blind now"  . Unstable angina ([place]) 5/01; 3/'02, 8/'03, 10/'04; 1/15   x 5 occurences since Inf-Post STEMI in 1994   There were no vitals taken for this visit.  Opioid Risk Score:   Fall Risk Score:  `1  Depression screen PHQ 2/9  Depression screen [facility] 2/9 03/29/2017 05/19/2016 03/30/2016  Decreased Interest 3 3 3   Down, Depressed, Hopeless 3 3 3   PHQ - 2 Score 6 6 6   Altered sleeping - - 3  Tired, decreased energy - - 3  Change in appetite - - 2  Feeling bad or failure about yourself  - - 3  Trouble concentrating - - 2  Moving slowly or fidgety/restless - - 0  Suicidal thoughts - - 0  PHQ-9 Score - - 19  Some recent data might be hidden     Review of Systems  Constitutional: Positive for unexpected weight  change.  HENT: Negative.   Eyes: Negative.   Respiratory: Positive for shortness of breath.   Cardiovascular: Negative.   Gastrointestinal: Positive for diarrhea and nausea.  Endocrine: Negative.   Genitourinary: Negative.   Musculoskeletal: Negative.   Skin: Negative.   Allergic/Immunologic: Negative.   Neurological: Negative.     Hematological: Negative.   Psychiatric/Behavioral: Negative.   All other systems reviewed and are negative.      Objective:   Physical Exam General: Alert and oriented x 3, No apparent distress. She remains overweight HEENT:PERRL, EOMI Neck:supple Heart:RRR Chest:Unlabored Abdomen:Soft, non-tender, non-distended, bowel sounds positive. Extremities:pulses2+ Skin:Clean and intact without signs of breakdown. Has dry skin in places, diffuse lesions on lgs Neuro:CN normal. Motor 5/5. No sensory loss Musculoskeletal:right shoulder with improved painwithIR/ER/ABD. I. . She has no gross rheumatoid deformities in the hands or feet. low back tender especially in RLQ with palpation once again. Flexion and facet maneuver triggered pain as well as lateral bending. SST was equivocal with hamstring tightness. SLR the same. Both hamstrings were tight with lumbar flexion.  Both knees with effusions, left more affected than right.  Crepitus in left knee with extension and flexion.  McMurray's test positive along left lateral knee.  She is antalgic on both sides.  She is wearing the left knee sleeve today.  Psych:Pleasant and cooperative       Assessment & Plan:  1. Fibromyalgia 2. Rheumatoid Arthritis by history 3. Osteoarthritis--polyarticular, bilateral knees quite involved, left more than right 4. ESRD with hx of renal transplant on chronic steroids 5. Right rotator cuff tendonitis/like degenerative/inflammatory changes in the right shoulder also.  6. Hx o falls, gait disorder 7. Right low back pain with radicular symptoms. ?spondylosis per recent plain film   Plan: 1. Continues to be a fall risk due to her balance, pain.  I wrote her a letter today to help her find some court assigned duty which is less physically strenuous for her 2. oxycodone 5mg  q8 prn, #60-- A second rx was provided for January. We will continue the opioid monitoring program, this consists of  regular clinic visits, examinations, routine drug screening, pill counts as well as use of [place] Controlled Substance Reporting System. [place].   3. Continuecymbalta to 60mg  daily, continue zoloft at 100mg  as well for now. Would still like to weanzoloft to offmoving forward given high risk of serotonin syndrome.  4. Continue tegretol200mg  BID for FMS/neuropathic pain 6. After informed consent and preparation of the skin with betadine and isopropyl alcohol, I injected 6mg  (1cc) of celestone and 4cc of 1% lidocaine into the left knee via antero-lateral approach. Additionally, aspiration was performed prior to injection. The patient tolerated well, and no complications were encountered. Afterward the area was cleaned and dressed. Post- injection instructions were provided.   -I also sent her for a left neoprene hinged knee brace per [facility] 7.   Still believe it would be helpful for her to see Dr. Sima Matas regarding pain/coping skills given her multiple losses and chronic pain.  She is profoundly affected by stresses and other issues around her home in life. 8.   Ordered an MRI of her lumbar spine to assess ongoing back pain and radicular symptoms     25 minutes of face to face patient care time were spent during this visit. All questions were encouraged and answered.  Follow up with NP in about 2 months.

## 2017-08-02 NOTE — Patient Instructions (Signed)
PLEASE FEEL FREE TO CALL OUR OFFICE WITH ANY PROBLEMS OR QUESTIONS ([phone])  HAVE A HAPPY HOLIDAYS!                     ^                  ^^                ^ ^ ^             ^ ^ ^ ^ ^           ^ ^ ^ ^ ^ ^ ^        ^ ^ ^ ^ ^ ^ ^ ^ ^      ^ ^ ^ ^ ^ ^ ^ ^ ^ ^ ^                ^^^^                ^^^^                ^^^^     

## 2017-08-11 ENCOUNTER — Telehealth: Payer: Self-pay

## 2017-08-11 DIAGNOSIS — M1711 Unilateral primary osteoarthritis, right knee: Secondary | ICD-10-CM

## 2017-08-11 NOTE — Telephone Encounter (Signed)
Patient called stating that she had a knee injection from Dr. Naaman Plummer last week and is concerned because it hasnt healed back like it was. Please advise.

## 2017-08-12 ENCOUNTER — Encounter: Payer: Self-pay | Admitting: *Deleted

## 2017-08-18 ENCOUNTER — Other Ambulatory Visit: Payer: Medicare Other

## 2017-08-18 DIAGNOSIS — M1711 Unilateral primary osteoarthritis, right knee: Secondary | ICD-10-CM | POA: Insufficient documentation

## 2017-08-18 NOTE — Telephone Encounter (Signed)
Spoke to patient. Ordered MRI right knee

## 2017-08-18 NOTE — Addendum Note (Signed)
Addended by: Geryl Rankins D on: 08/18/2017 03:57 PM   Modules accepted: Orders

## 2017-08-20 DIAGNOSIS — R399 Unspecified symptoms and signs involving the genitourinary system: Secondary | ICD-10-CM | POA: Diagnosis not present

## 2017-08-22 IMAGING — US US ABDOMEN LIMITED
1 series · 14 of 25 positions shown · non-contrast
Comparison: CT scan 07/28/2014

CLINICAL DATA: Right upper quadrant abdominal pain for 1 month.
History of renal transplant. Dialysis patient. Status post
cholecystectomy.

EXAM:
US ABDOMEN LIMITED - RIGHT UPPER QUADRANT

[Series 1: us abdomen limited · 0.19mm/px · 14 of 41 slices shown]
[im 1/41]
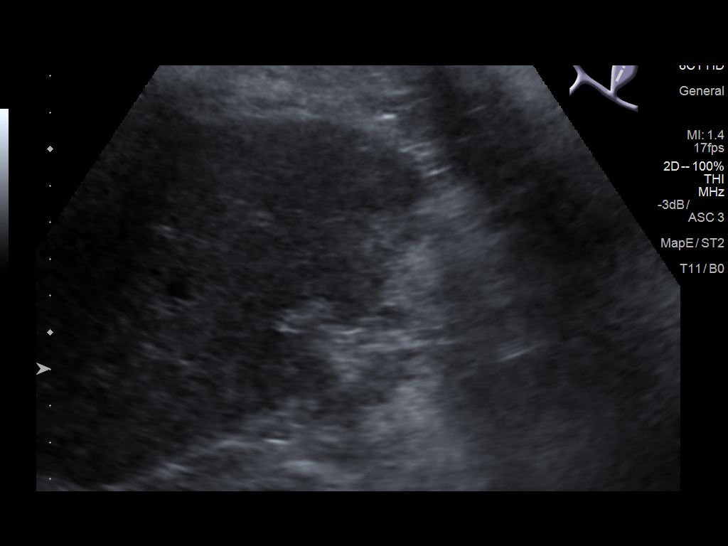
[im 4/41]
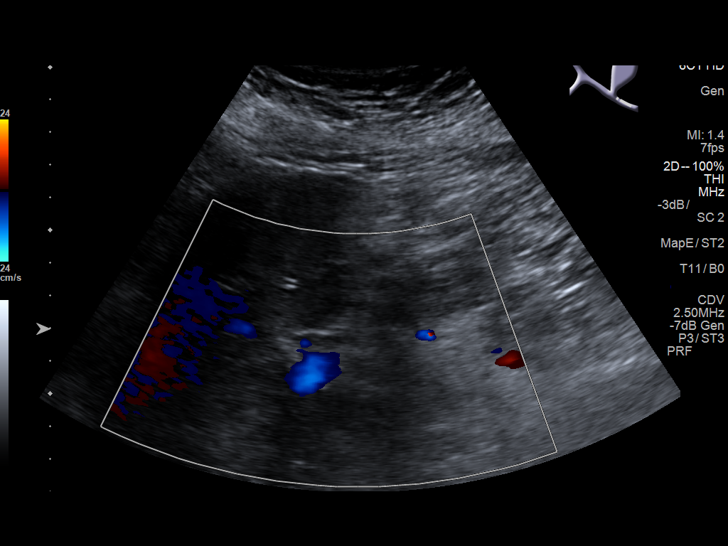
[im 7/41]
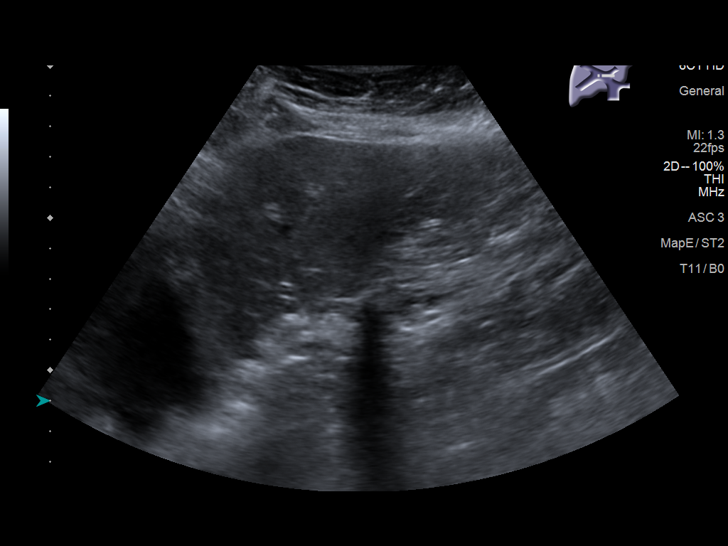
[im 11/41]
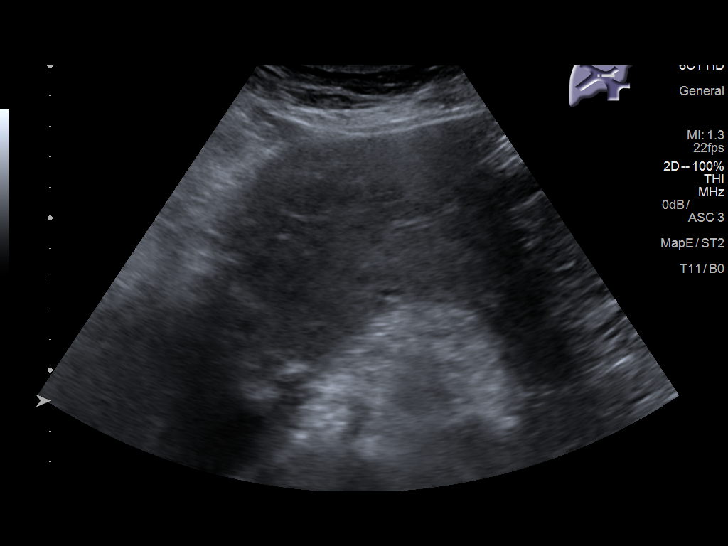
[im 14/41]
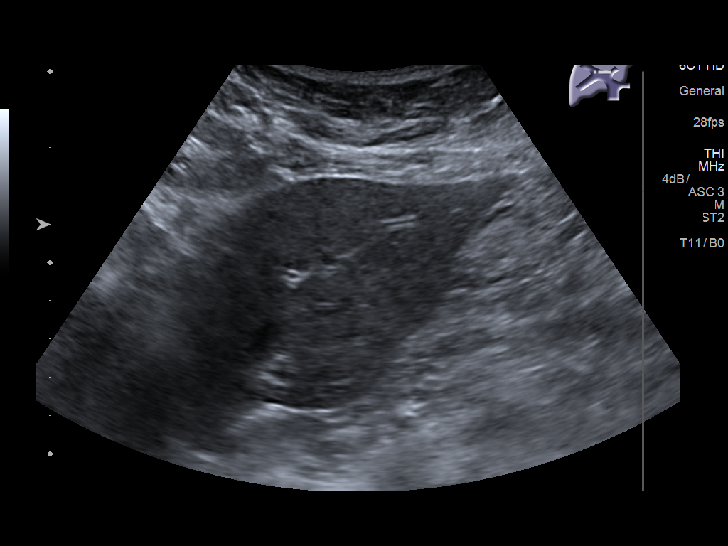
[im 16/41]
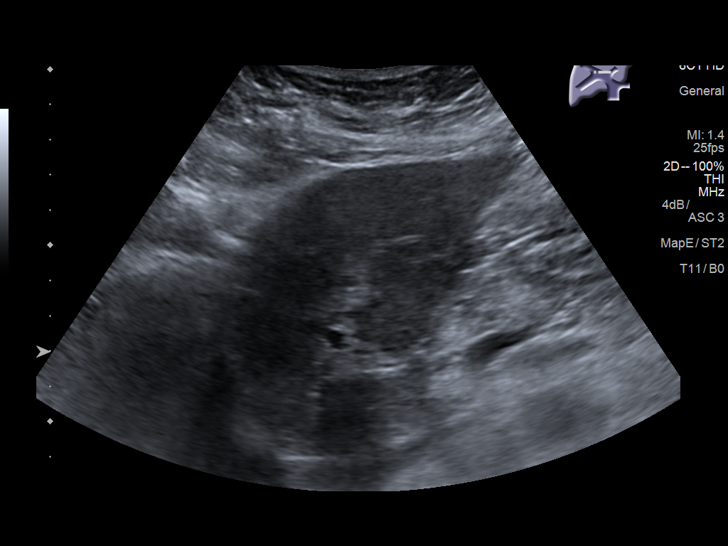
[im 19/41]
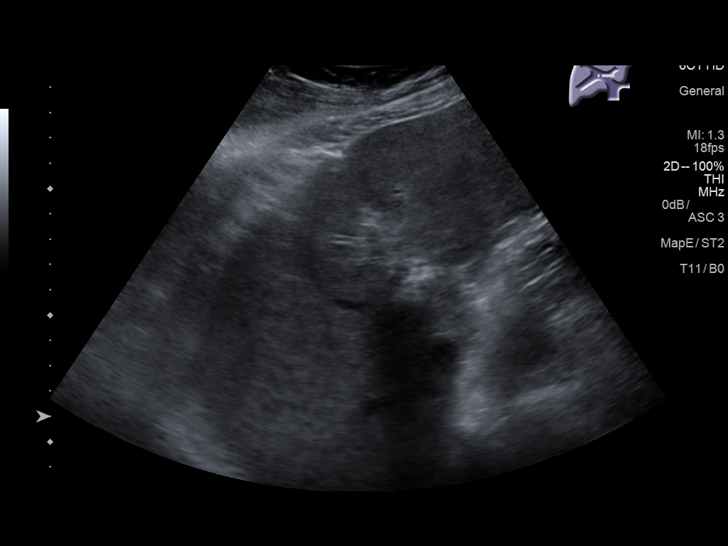
[im 22/41]
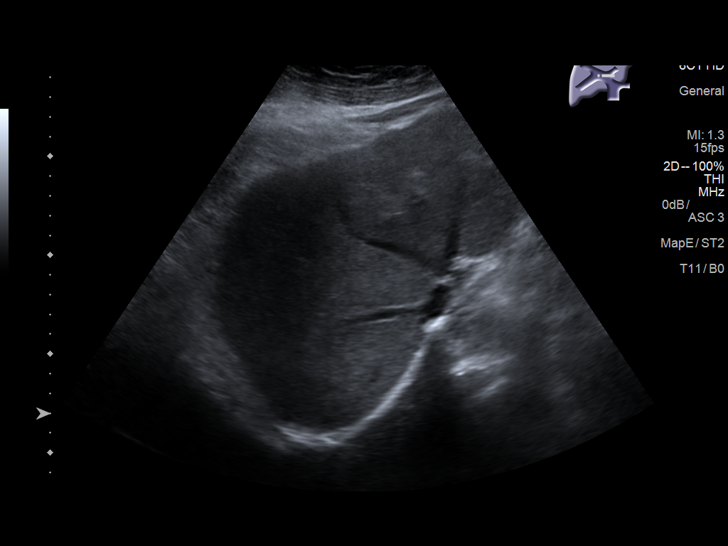
[im 26/41]
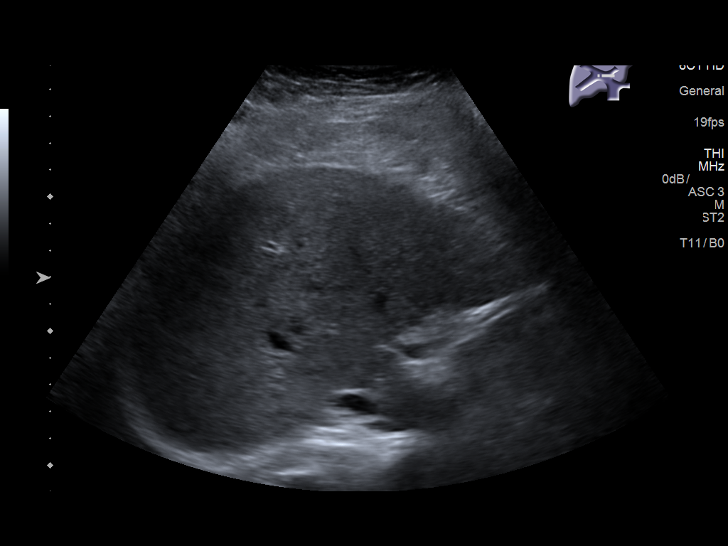
[im 27/41]
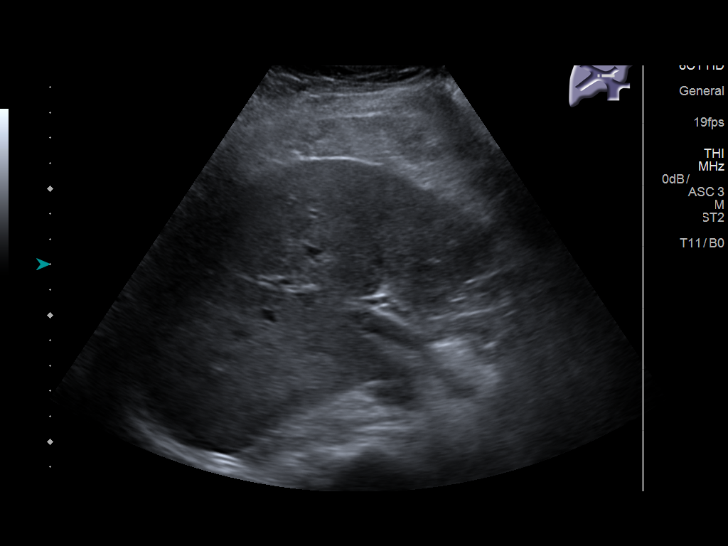
[im 31/41]
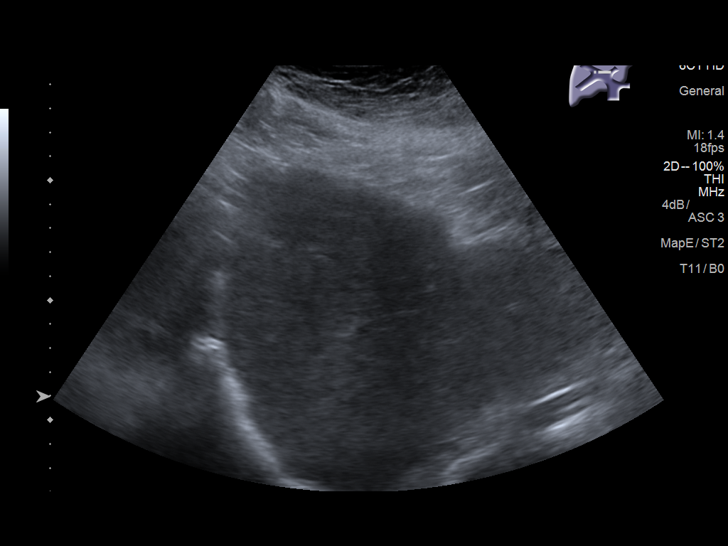
[im 34/41]
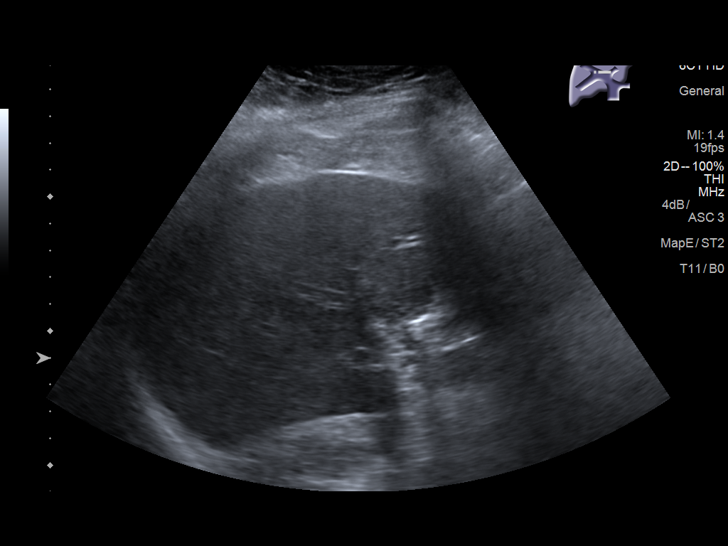
[im 37/41]
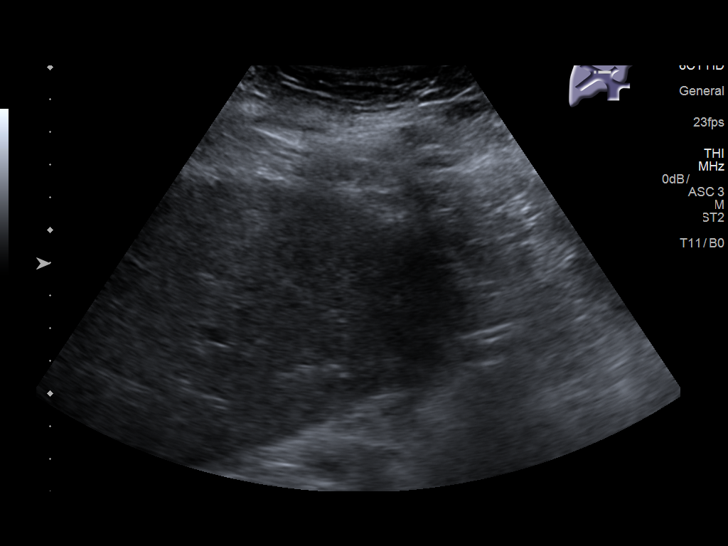
[im 41/41]
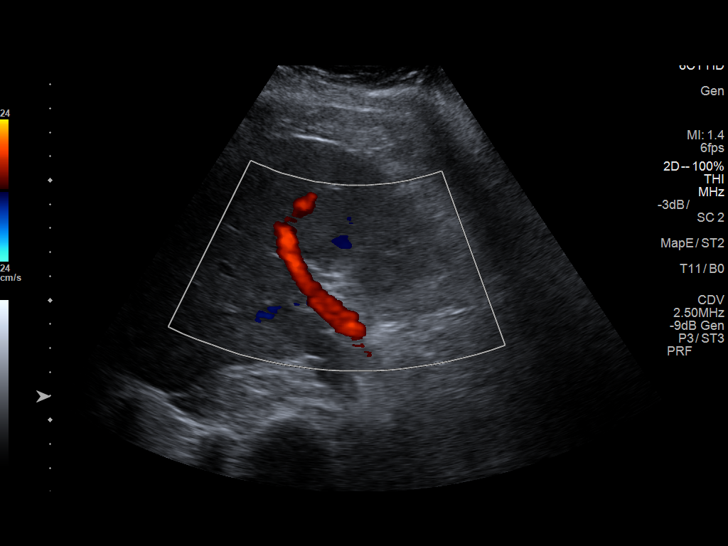

[14 of 25 positions shown; findings below may reference images not displayed]

FINDINGS: Gallbladder:

Surgically absent.

Common bile duct:

Diameter: 4.8 mm

Liver:

The liver contour is irregular and the left hepatic lobe appears
enlarged. Findings suggestive of cirrhosis. No focal hepatic lesions
or intrahepatic biliary dilatation. The liver echotexture is mildly
coarse.
IMPRESSION: Ultrasound findings suggest cirrhotic changes involving the liver.
No focal hepatic lesions.

Status post cholecystectomy.  No biliary dilatation.

## 2017-08-23 ENCOUNTER — Ambulatory Visit: Payer: Medicare Other | Admitting: Cardiology

## 2017-08-25 ENCOUNTER — Inpatient Hospital Stay: Admission: RE | Admit: 2017-08-25 | Payer: Medicare Other | Source: Ambulatory Visit

## 2017-08-25 ENCOUNTER — Ambulatory Visit
Admission: RE | Admit: 2017-08-25 | Discharge: 2017-08-25 | Disposition: A | Discharge status: Home / Self Care | Payer: Medicare Other | Source: Ambulatory Visit | Attending: Physical Medicine & Rehabilitation | Admitting: Physical Medicine & Rehabilitation

## 2017-08-25 DIAGNOSIS — M47816 Spondylosis without myelopathy or radiculopathy, lumbar region: Secondary | ICD-10-CM

## 2017-08-25 DIAGNOSIS — M25562 Pain in left knee: Secondary | ICD-10-CM | POA: Diagnosis not present

## 2017-08-25 DIAGNOSIS — G894 Chronic pain syndrome: Secondary | ICD-10-CM

## 2017-08-25 DIAGNOSIS — M48061 Spinal stenosis, lumbar region without neurogenic claudication: Secondary | ICD-10-CM | POA: Diagnosis not present

## 2017-08-25 DIAGNOSIS — M1712 Unilateral primary osteoarthritis, left knee: Secondary | ICD-10-CM

## 2017-08-26 ENCOUNTER — Telehealth: Payer: Self-pay | Admitting: Physical Medicine & Rehabilitation

## 2017-08-26 IMAGING — CR DG SHOULDER 2+V*R*
3 series · 3 of 3 positions shown · non-contrast
Comparison: None.

CLINICAL DATA: Decreased range of motion.

EXAM:
RIGHT SHOULDER - 2+ VIEW

[w shoulder ap internal righ]
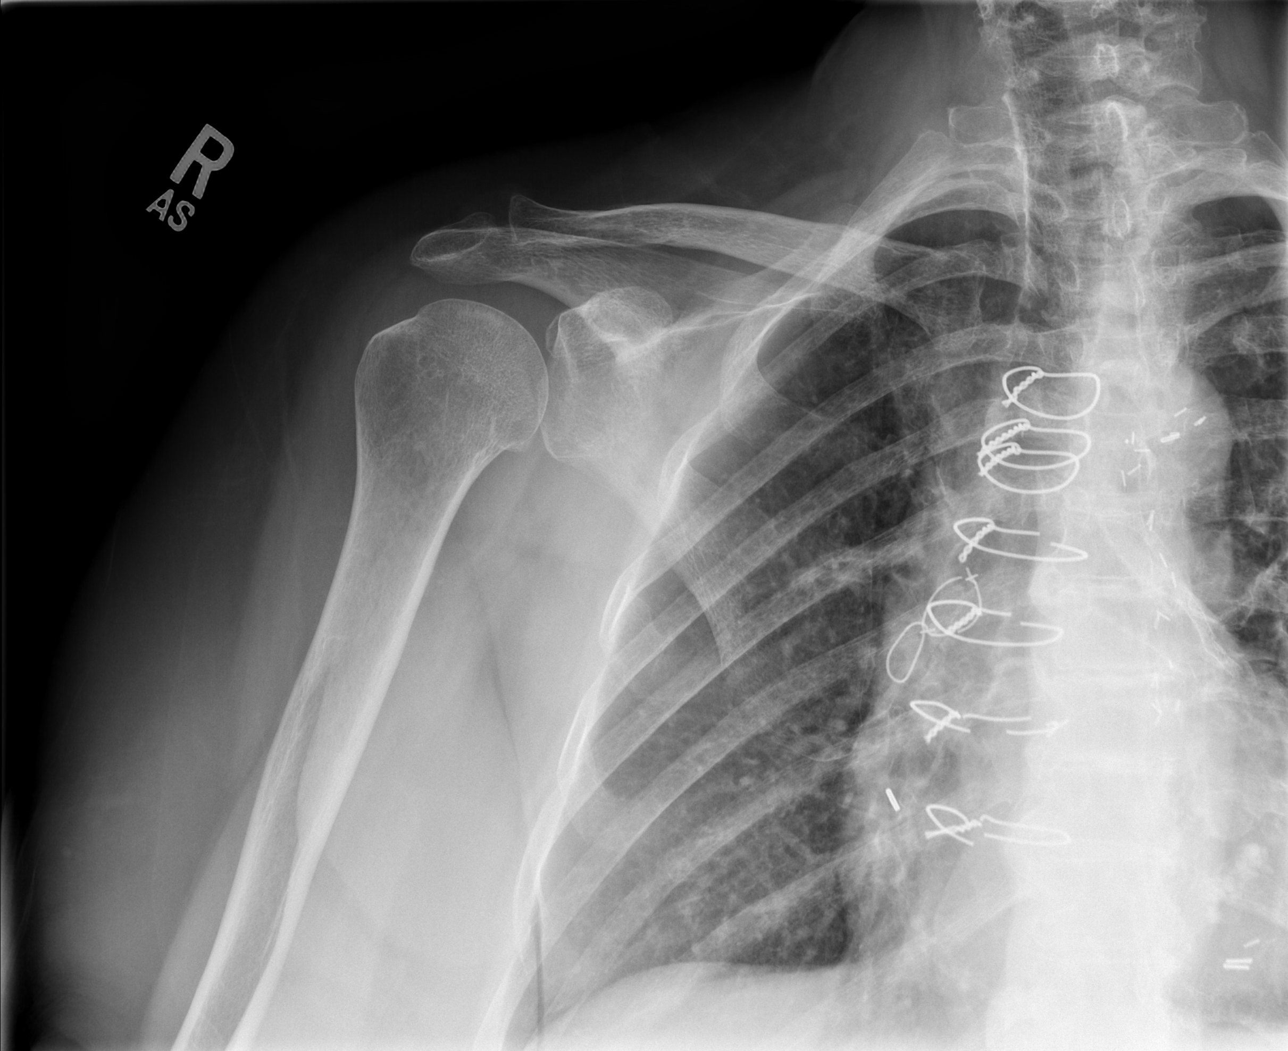

[w shoulder y view right]
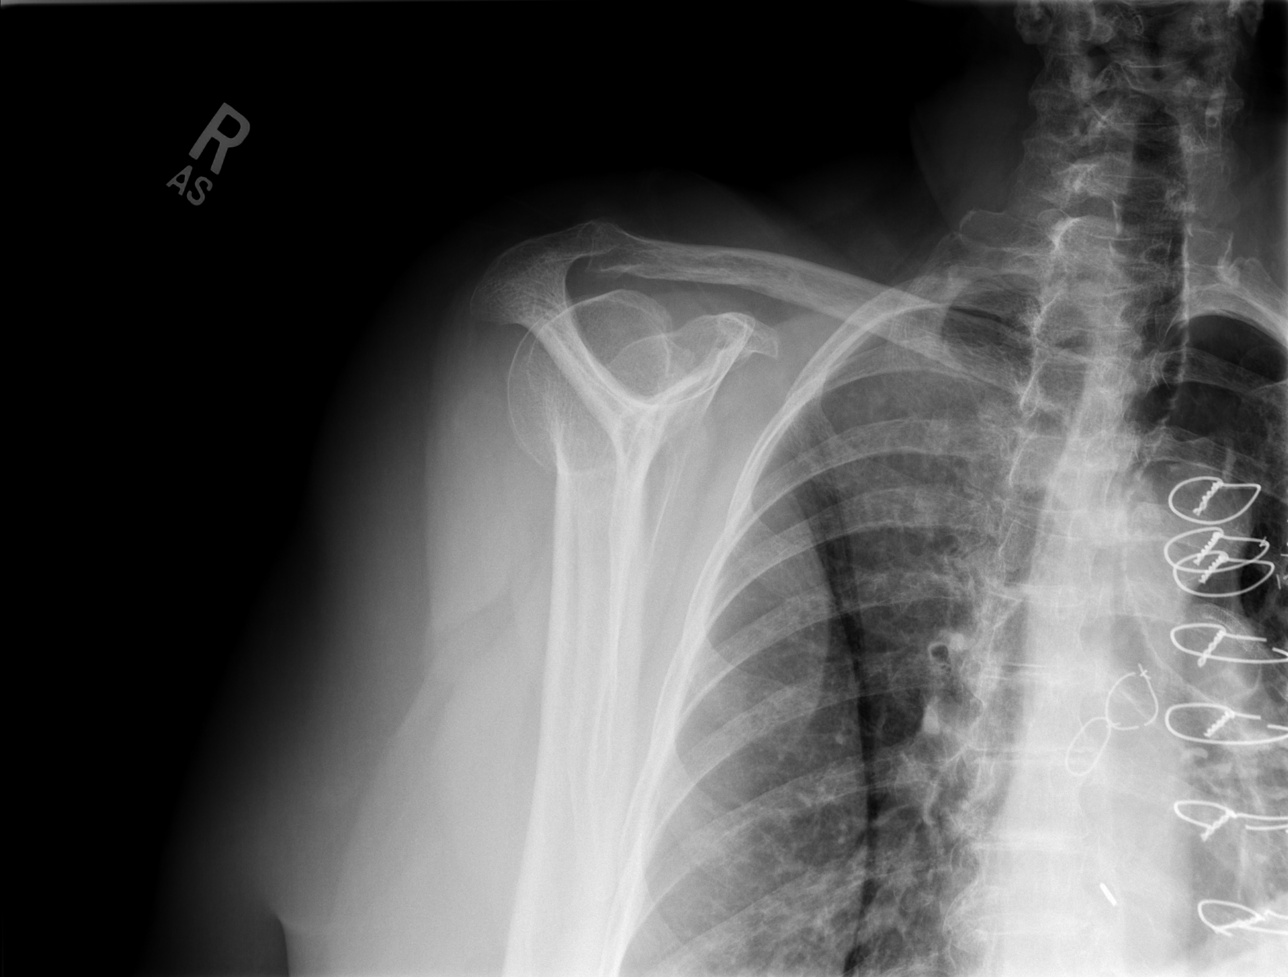

[w shoulder axillary right *]
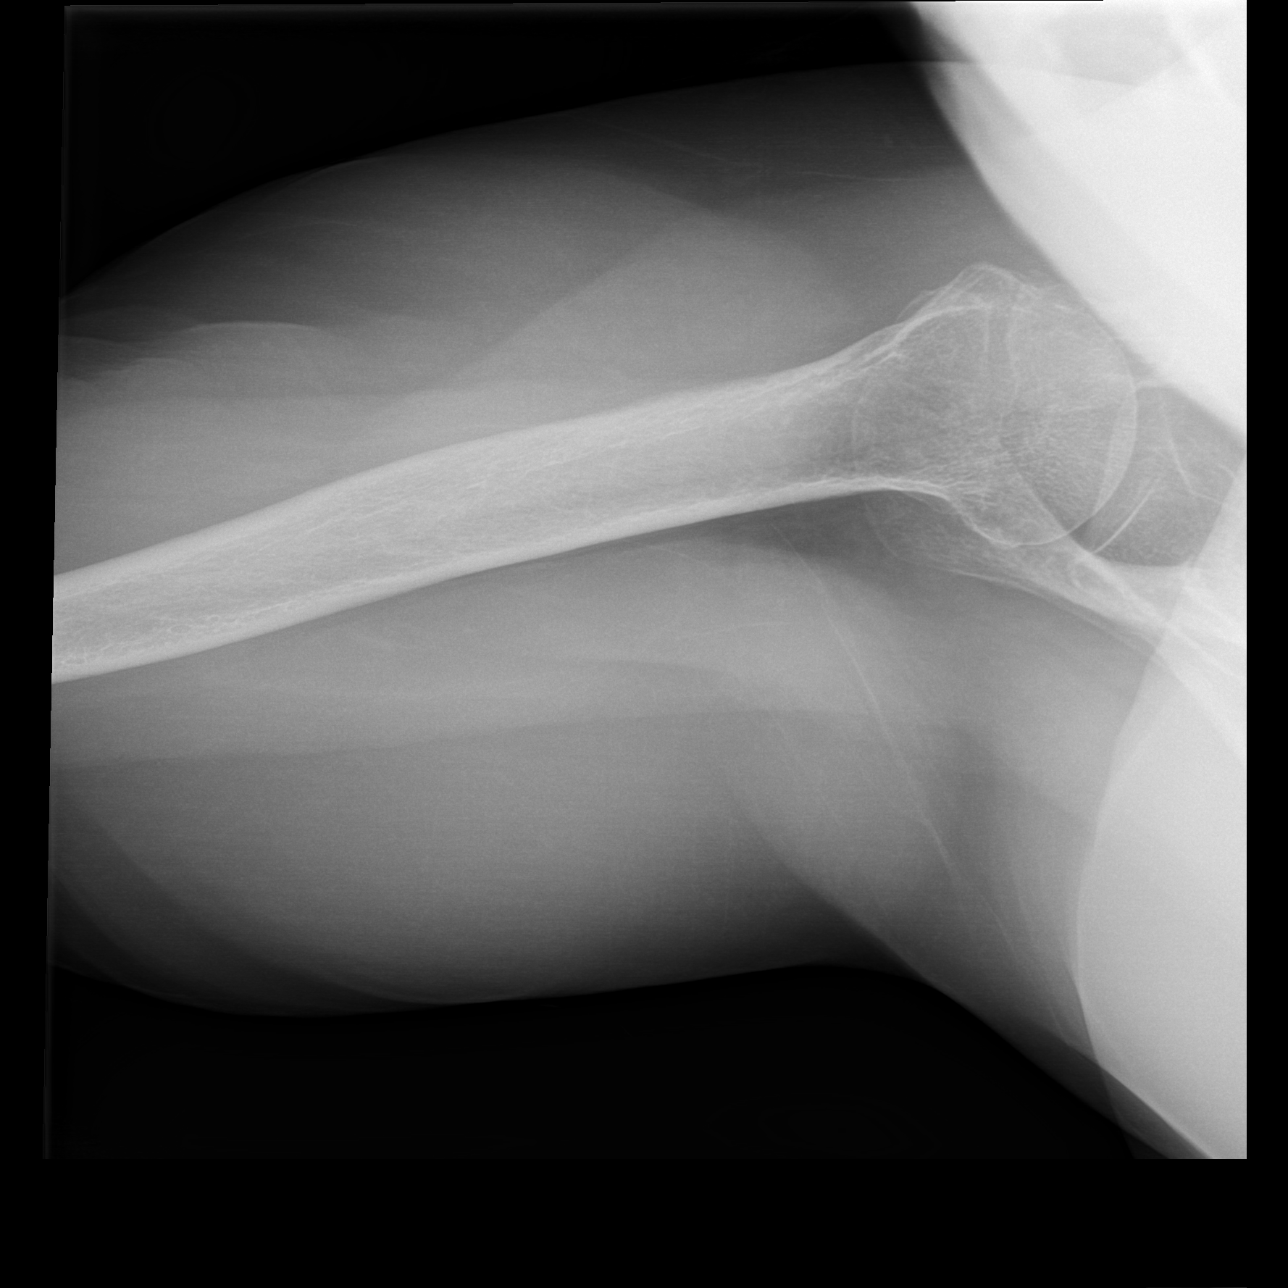

[3 of 3 positions shown; findings below may reference images not displayed]

FINDINGS: There is no evidence of fracture or dislocation. There is no
evidence of arthropathy or other focal bone abnormality. Soft
tissues are unremarkable.
IMPRESSION: Negative.

## 2017-08-26 NOTE — Telephone Encounter (Signed)
  L4-L5: 2 mm anterolisthesis. Facet arthropathy. Small leftward foraminal protrusion. See sagittal image 12, and axial image 34. The LEFT L4 nerve root is contacted, and may be displaced. There does not appear to be significant foraminal narrowing but the nerve could be additionally impacted in the extraforaminal compartment. Correlate clinically for LEFT L4 nerve root irritation.  L5-S1:  Facet arthropathy.  Annular bulge.  No impingement.   1. Severely degenerated and torn lateral meniscus. 2. Advanced mucoid degeneration of the ACL. 3. Tricompartmental degenerative changes most significant in the lateral compartment. 4. Moderate-sized joint effusion and mild synovitis. Small leaking Baker's cyst.   I reviewed both studies with the patient this morning.  Advised follow-up with orthopedic surgery.  She  already is a patient of [facility]. She will call them for an appointment to be assessed.  In the meantime she will continue conservative management and ice.  We also had ordered her a splint.

## 2017-08-27 ENCOUNTER — Telehealth: Payer: Self-pay

## 2017-08-27 NOTE — Telephone Encounter (Signed)
Patient called requesting copies of her MRI results. She also wants someone to call her back and explain exactly what the report says. Please advise.

## 2017-08-30 NOTE — Telephone Encounter (Signed)
Spoke with patient.  She already had a conversation with Dr. Naaman Plummer. I informed her that she needs to contact [facility] to get copies and report of her MRI. Patient verbalized understanding.

## 2017-08-30 NOTE — Telephone Encounter (Signed)
I cannot get her a copy of MRI- she has to request herself, and certainly cannot read results she will have to discuss with her MD

## 2017-08-30 NOTE — Telephone Encounter (Signed)
First of all, Patient is asking about the results of her MRI. Secondly she is asking for copies of the MRI report. Routed to [employer] as well

## 2017-08-31 DIAGNOSIS — N390 Urinary tract infection, site not specified: Secondary | ICD-10-CM | POA: Diagnosis not present

## 2017-08-31 DIAGNOSIS — R399 Unspecified symptoms and signs involving the genitourinary system: Secondary | ICD-10-CM | POA: Diagnosis not present

## 2017-09-01 ENCOUNTER — Telehealth: Payer: Self-pay | Admitting: Physical Medicine & Rehabilitation

## 2017-09-01 NOTE — Telephone Encounter (Signed)
Patient left voice mail she needs the "diagnosis" dr. Naaman Plummer dictated from MRI as she has been notified we cannot give her a copy of the MRI or the diagnostic reading of the MRI - only our physician's opinion.  We don't own MRI or diagnostic reading.   I left voicemail for her I have printed dr Naaman Plummer notes from 1.10.19 that were discussed in their visit.

## 2017-09-03 DIAGNOSIS — M1712 Unilateral primary osteoarthritis, left knee: Secondary | ICD-10-CM | POA: Diagnosis not present

## 2017-09-03 DIAGNOSIS — S83282A Other tear of lateral meniscus, current injury, left knee, initial encounter: Secondary | ICD-10-CM | POA: Diagnosis not present

## 2017-09-09 ENCOUNTER — Telehealth: Payer: Self-pay | Admitting: Emergency Medicine

## 2017-09-09 MED ORDER — PREDNISONE 10 MG PO TABS: ORAL_TABLET | ORAL | 0 refills | Status: DC

## 2017-09-09 MED ORDER — AMOXICILLIN 500 MG PO TABS: ORAL_TABLET | ORAL | 0 refills | Status: DC

## 2017-09-09 NOTE — Telephone Encounter (Signed)
Ok to prescribe amoxicillin 1g po q8h x 7 days Prednisone 20mg  daily x 5 days

## 2017-09-09 NOTE — Telephone Encounter (Signed)
Called and spoke to pt. Pt c/o prod cough with yellow mucus, head congestion, right ear pain, chest tightness when taking a deep breath, slight increase SOB, and states she is using her albuterol hfa daily. Pt states her s/s have been present x 3 days. Pt denies f/c/s. Pt states she took dayquil without relief of s/s. Pt also refused an appt this week and is requesting recs from [facility]. Pt has an ROV on 1.31.2019 with [facility].   Dr. Lamonte Sakai please advise. Thanks.

## 2017-09-10 ENCOUNTER — Other Ambulatory Visit: Payer: Self-pay | Admitting: Physical Medicine & Rehabilitation

## 2017-09-10 DIAGNOSIS — E1142 Type 2 diabetes mellitus with diabetic polyneuropathy: Secondary | ICD-10-CM

## 2017-09-10 DIAGNOSIS — M797 Fibromyalgia: Secondary | ICD-10-CM

## 2017-09-13 DIAGNOSIS — Z940 Kidney transplant status: Secondary | ICD-10-CM | POA: Diagnosis not present

## 2017-09-13 DIAGNOSIS — E669 Obesity, unspecified: Secondary | ICD-10-CM | POA: Diagnosis not present

## 2017-09-13 DIAGNOSIS — I2510 Atherosclerotic heart disease of native coronary artery without angina pectoris: Secondary | ICD-10-CM | POA: Diagnosis not present

## 2017-09-13 DIAGNOSIS — G4733 Obstructive sleep apnea (adult) (pediatric): Secondary | ICD-10-CM | POA: Diagnosis not present

## 2017-09-13 DIAGNOSIS — D631 Anemia in chronic kidney disease: Secondary | ICD-10-CM | POA: Diagnosis not present

## 2017-09-13 DIAGNOSIS — I639 Cerebral infarction, unspecified: Secondary | ICD-10-CM | POA: Diagnosis not present

## 2017-09-13 DIAGNOSIS — I739 Peripheral vascular disease, unspecified: Secondary | ICD-10-CM | POA: Diagnosis not present

## 2017-09-13 DIAGNOSIS — E118 Type 2 diabetes mellitus with unspecified complications: Secondary | ICD-10-CM | POA: Diagnosis not present

## 2017-09-13 DIAGNOSIS — N2581 Secondary hyperparathyroidism of renal origin: Secondary | ICD-10-CM | POA: Diagnosis not present

## 2017-09-13 DIAGNOSIS — E785 Hyperlipidemia, unspecified: Secondary | ICD-10-CM | POA: Diagnosis not present

## 2017-09-13 DIAGNOSIS — I129 Hypertensive chronic kidney disease with stage 1 through stage 4 chronic kidney disease, or unspecified chronic kidney disease: Secondary | ICD-10-CM | POA: Diagnosis not present

## 2017-09-13 DIAGNOSIS — N182 Chronic kidney disease, stage 2 (mild): Secondary | ICD-10-CM | POA: Diagnosis not present

## 2017-09-14 IMAGING — DX DG ABDOMEN ACUTE W/ 1V CHEST
3 series · 3 of 3 positions shown · non-contrast
Comparison: Chest radiographs 05/08/2016. CT abdomen and pelvis
07/28/2014.

CLINICAL DATA: Periumbilical abdominal pain with bloating,
diarrhea, and weight loss.

EXAM:
DG ABDOMEN ACUTE W/ 1V CHEST

[dg abd acute w/chest (1 of 3)]
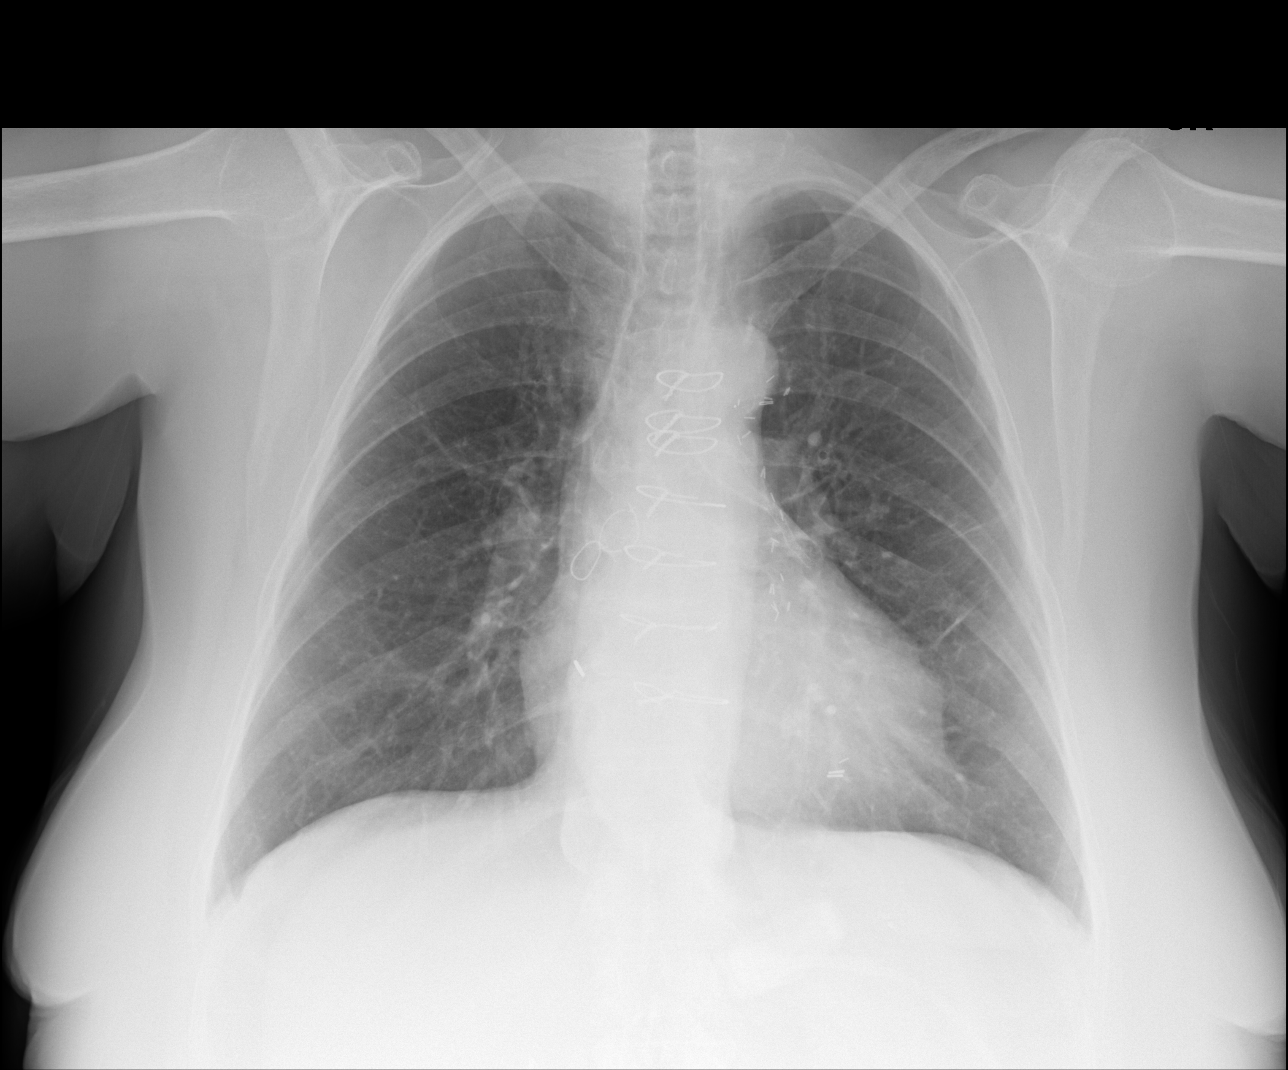

[dg abd acute w/chest (2 of 3)]
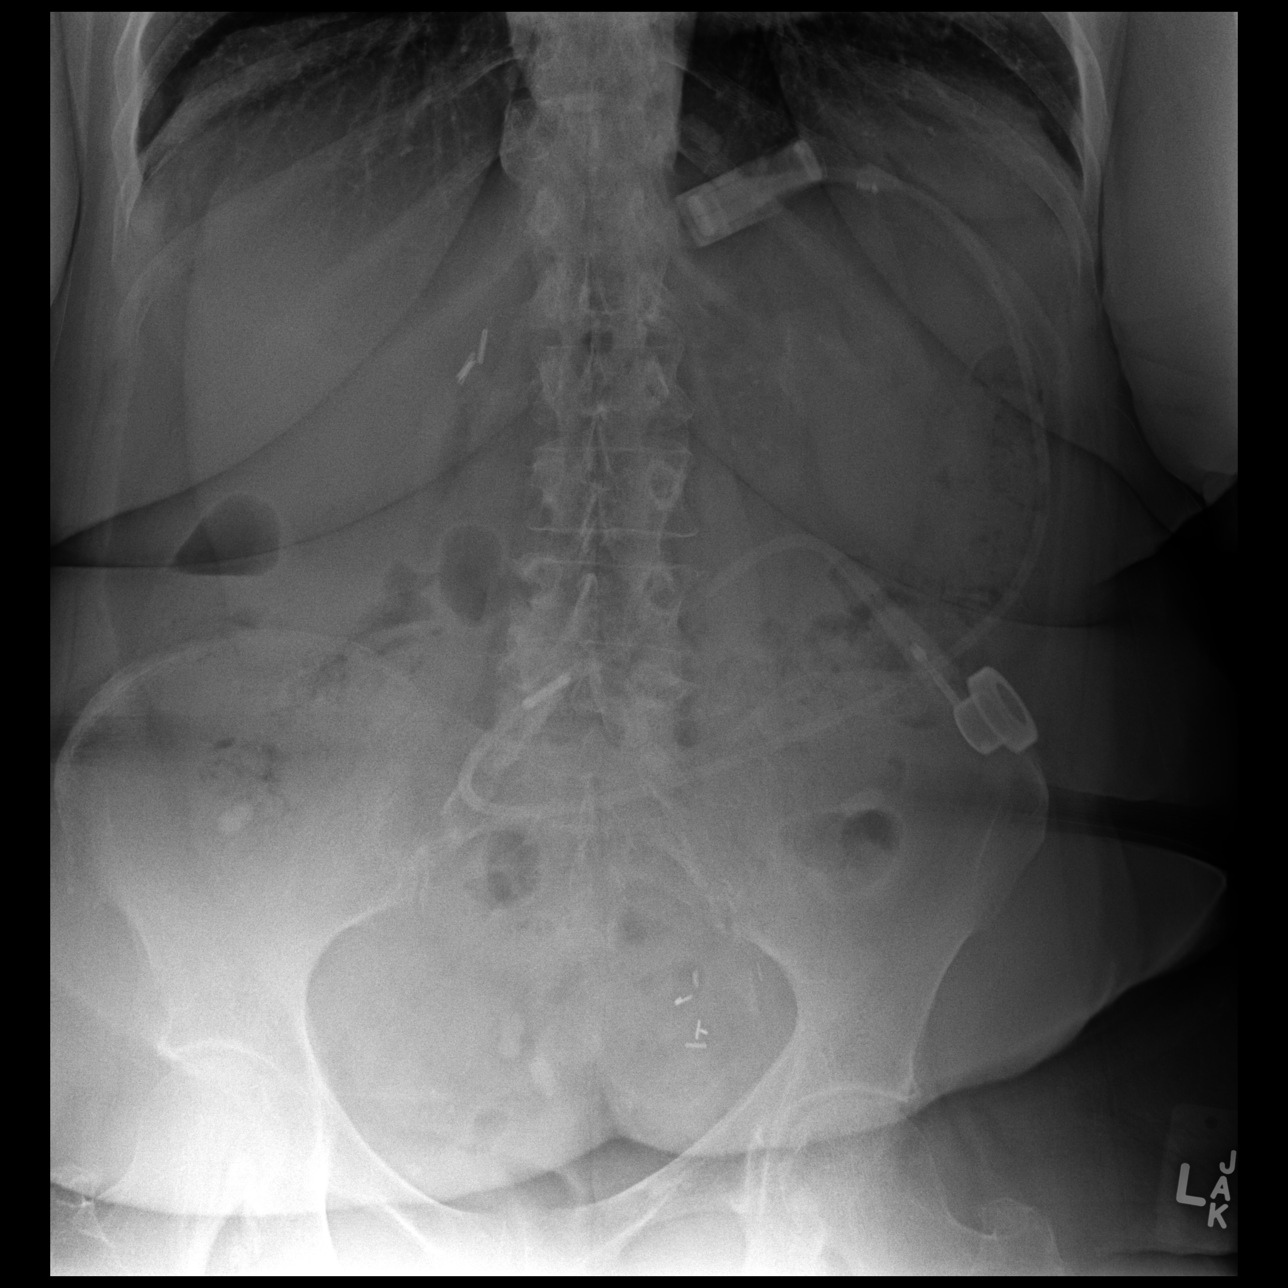

[dg abd acute w/chest (3 of 3)]
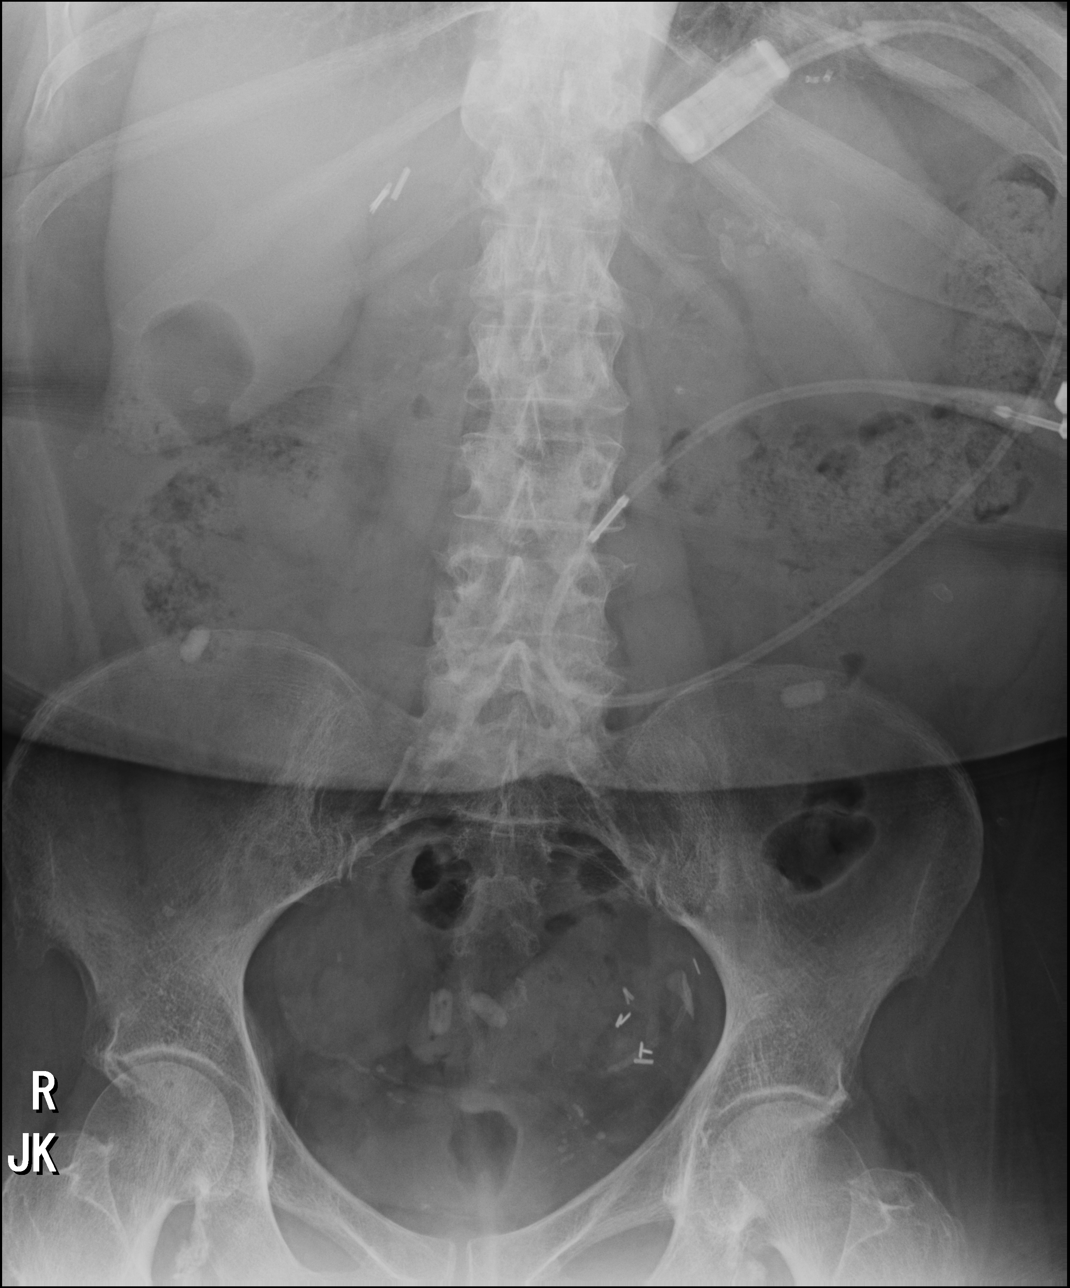

[3 of 3 positions shown; findings below may reference images not displayed]

FINDINGS: Sequelae of prior CABG are again identified. The cardiomediastinal
silhouette is unchanged and within normal limits. Left lung scarring
is unchanged. Airway thickening on the prior study has decreased.
There is no evidence of acute airspace consolidation, edema, pleural
effusion, or pneumothorax.

A gastric band is in place with Phi angle of 48 degrees, within
normal limits. Right upper quadrant and left pelvic surgical clips
are present. There is no evidence of intraperitoneal free air. No
dilated loops of bowel are seen to suggest obstruction. A few
scattered oblong densities overlying the colon may reflect
medication capsules. Vascular calcifications are noted. No acute
osseous abnormality is identified.
IMPRESSION: No evidence of active cardiopulmonary disease or acute abnormality
in the abdomen.

## 2017-09-15 ENCOUNTER — Telehealth: Payer: Self-pay | Admitting: Cardiology

## 2017-09-15 NOTE — Telephone Encounter (Signed)
New message  Patient calling upset because dental office unwilling to complete clearance without a call from [facility]. Regulatory affairs officer called office to assist patient) Spoke with Aldona Bar to obtain clearance information.    1. What dental office are you calling from? Dr Stefanie Libel  2. What is your office phone and fax number? [phone], fax [phone] Attn: Aldona Bar  3. What type of procedure is the patient having performed? Frenectomy  4. What date is procedure scheduled or is the patient there now? 2/4  5. What is your question (ex. Antibiotics prior to procedure, holding medication-we need to know how long dentist wants pt to hold med)? Hold for Plavix

## 2017-09-16 ENCOUNTER — Ambulatory Visit: Payer: Medicare Other | Admitting: Emergency Medicine

## 2017-09-16 ENCOUNTER — Ambulatory Visit: Payer: Medicare Other | Admitting: Internal Medicine

## 2017-09-16 NOTE — Telephone Encounter (Signed)
She should be fine.  [name], MD

## 2017-09-16 NOTE — Telephone Encounter (Signed)
   Primary Cardiologist: [name], MD  Chart reviewed as part of pre-operative protocol coverage. Given past medical history and time since last visit, based on ACC/AHA guidelines, Katherine [name] would be at acceptable risk for the planned procedure without further cardiovascular testing.  She is only planning on local injected anesthetic.  She has no chest pain.  No SOB.   She has been holding plavix all week.   I will route this recommendation to the requesting party via Epic fax function and remove from pre-op pool.  Please call with questions.  Cecilie Kicks, NP 09/16/2017, 3:17 PM

## 2017-09-17 NOTE — Telephone Encounter (Signed)
I will route this recommendations to requesting provider.   Leanor Kail, PA-C

## 2017-09-20 ENCOUNTER — Ambulatory Visit: Payer: Medicare Other | Admitting: Cardiology

## 2017-09-20 DIAGNOSIS — R112 Nausea with vomiting, unspecified: Secondary | ICD-10-CM | POA: Diagnosis not present

## 2017-09-20 DIAGNOSIS — K2970 Gastritis, unspecified, without bleeding: Secondary | ICD-10-CM | POA: Diagnosis not present

## 2017-09-21 ENCOUNTER — Encounter (HOSPITAL_COMMUNITY): Payer: Self-pay | Admitting: Emergency Medicine

## 2017-09-21 ENCOUNTER — Emergency Department (HOSPITAL_COMMUNITY)
Admission: EM | Admit: 2017-09-21 | Discharge: 2017-09-21 | Disposition: A | Discharge status: Home / Self Care | Payer: Medicare Other | Attending: Emergency Medicine | Admitting: Emergency Medicine

## 2017-09-21 DIAGNOSIS — Z955 Presence of coronary angioplasty implant and graft: Secondary | ICD-10-CM | POA: Diagnosis not present

## 2017-09-21 DIAGNOSIS — J449 Chronic obstructive pulmonary disease, unspecified: Secondary | ICD-10-CM | POA: Diagnosis not present

## 2017-09-21 DIAGNOSIS — Z794 Long term (current) use of insulin: Secondary | ICD-10-CM | POA: Diagnosis not present

## 2017-09-21 DIAGNOSIS — R112 Nausea with vomiting, unspecified: Secondary | ICD-10-CM | POA: Diagnosis not present

## 2017-09-21 DIAGNOSIS — Z79899 Other long term (current) drug therapy: Secondary | ICD-10-CM | POA: Insufficient documentation

## 2017-09-21 DIAGNOSIS — E1140 Type 2 diabetes mellitus with diabetic neuropathy, unspecified: Secondary | ICD-10-CM | POA: Diagnosis not present

## 2017-09-21 DIAGNOSIS — Z87891 Personal history of nicotine dependence: Secondary | ICD-10-CM | POA: Insufficient documentation

## 2017-09-21 DIAGNOSIS — Z951 Presence of aortocoronary bypass graft: Secondary | ICD-10-CM | POA: Diagnosis not present

## 2017-09-21 DIAGNOSIS — Z7902 Long term (current) use of antithrombotics/antiplatelets: Secondary | ICD-10-CM | POA: Insufficient documentation

## 2017-09-21 DIAGNOSIS — I2510 Atherosclerotic heart disease of native coronary artery without angina pectoris: Secondary | ICD-10-CM | POA: Diagnosis not present

## 2017-09-21 DIAGNOSIS — I10 Essential (primary) hypertension: Secondary | ICD-10-CM | POA: Insufficient documentation

## 2017-09-21 DIAGNOSIS — I447 Left bundle-branch block, unspecified: Secondary | ICD-10-CM | POA: Diagnosis not present

## 2017-09-21 LAB — URINALYSIS, ROUTINE W REFLEX MICROSCOPIC
BILIRUBIN URINE: NEGATIVE
Glucose, UA: 50 mg/dL — AB
HGB URINE DIPSTICK: NEGATIVE
Ketones, ur: NEGATIVE mg/dL
LEUKOCYTES UA: NEGATIVE
NITRITE: NEGATIVE
PH: 5 (ref 5.0–8.0)
Protein, ur: NEGATIVE mg/dL
Specific Gravity, Urine: 1.011 (ref 1.005–1.030)
Squamous Epithelial / LPF: NONE SEEN

## 2017-09-21 LAB — COMPREHENSIVE METABOLIC PANEL
ALBUMIN: 3.5 g/dL (ref 3.5–5.0)
ALK PHOS: 108 U/L (ref 38–126)
ALT: 16 U/L (ref 14–54)
ANION GAP: 15 (ref 5–15)
AST: 28 U/L (ref 15–41)
BUN: 12 mg/dL (ref 6–20)
CALCIUM: 9.5 mg/dL (ref 8.9–10.3)
CHLORIDE: 101 mmol/L (ref 101–111)
CO2: 22 mmol/L (ref 22–32)
Creatinine, Ser: 0.83 mg/dL (ref 0.44–1.00)
GFR calc Af Amer: >60 mL/min (ref >60)
GFR calc non Af Amer: >60 mL/min (ref >60)
GLUCOSE: 147 mg/dL — AB (ref 65–99)
Potassium: 3.9 mmol/L (ref 3.5–5.1)
Sodium: 138 mmol/L (ref 135–145)
Total Bilirubin: 0.4 mg/dL (ref 0.3–1.2)
Total Protein: 6.9 g/dL (ref 6.5–8.1)

## 2017-09-21 LAB — CBC
HCT: 37.6 % (ref 36.0–46.0)
Hemoglobin: 12.5 g/dL (ref 12.0–15.0)
MCH: 32.8 pg (ref 26.0–34.0)
MCHC: 33.2 g/dL (ref 30.0–36.0)
MCV: 98.7 fL (ref 78.0–100.0)
PLATELETS: 175 10*3/uL (ref 150–400)
RBC: 3.81 MIL/uL — ABNORMAL LOW (ref 3.87–5.11)
RDW: 14.9 % (ref 11.5–15.5)
WBC: 8.8 10*3/uL (ref 4.0–10.5)

## 2017-09-21 LAB — CBG MONITORING, ED: Glucose-Capillary: 139 mg/dL — ABNORMAL HIGH (ref 65–99)

## 2017-09-21 LAB — LIPASE, BLOOD: Lipase: 19 U/L (ref 11–51)

## 2017-09-21 MED ORDER — DIPHENHYDRAMINE HCL 50 MG/ML IJ SOLN
25.0000 mg | Freq: Once | INTRAMUSCULAR | Status: AC
  Administered 2017-09-21: 25 mg via INTRAVENOUS
  Filled 2017-09-21: qty 1

## 2017-09-21 MED ORDER — METOCLOPRAMIDE HCL 5 MG/ML IJ SOLN
10.0000 mg | Freq: Once | INTRAMUSCULAR | Status: AC
  Administered 2017-09-21: 10 mg via INTRAVENOUS
  Filled 2017-09-21: qty 2

## 2017-09-21 MED ORDER — OXYCODONE-ACETAMINOPHEN 5-325 MG PO TABS
1.0000 | ORAL_TABLET | Freq: Once | ORAL | Status: AC
  Administered 2017-09-21: 1 via ORAL
  Filled 2017-09-21: qty 1

## 2017-09-21 MED ORDER — MORPHINE SULFATE (PF) 4 MG/ML IV SOLN
4.0000 mg | Freq: Once | INTRAVENOUS | Status: DC
  Filled 2017-09-21: qty 1

## 2017-09-21 MED ORDER — SODIUM CHLORIDE 0.9 % IV BOLUS (SEPSIS)
1000.0000 mL | Freq: Once | INTRAVENOUS | Status: AC
  Administered 2017-09-21: 1000 mL via INTRAVENOUS

## 2017-09-21 MED ORDER — METOCLOPRAMIDE HCL 10 MG PO TABS: 10.0000 mg | ORAL_TABLET | Freq: Four times a day (QID) | ORAL | 0 refills | Status: DC | PRN

## 2017-09-21 NOTE — ED Triage Notes (Signed)
Per EMS, pt from home. Pt had oral surgery today. Pt reports having a headache most of the day, took some pain medicine and went to bed at 2000. At 2200, pt was clammy, nauseous and week. Pt dry heaving with EMS. 4mg  zofran given by ems. Pt c/o abd pain increasing on palpation on left side. Hx MI, stroke and diabetes. Pt has also had an URI for the past week. Due to surgery pt has been off of Plavix for the last 5 days.   EMS VS BP 120, HR 80, R 14, 94% room air. CBG 175.   24g L wrist.

## 2017-09-21 NOTE — ED Provider Notes (Signed)
[facility] EMERGENCY DEPARTMENT Provider Note   CSN: [id number] Arrival date & time: 09/21/17  0022     History   Chief Complaint Chief Complaint  Patient presents with  . Emesis  . Weakness  . Abdominal Pain    HPI Katherine [name] is a 62 y.o. female.  The history is provided by the patient.  She has history of coronary artery disease status post coronary artery bypass, COPD, carotid artery stenosis, end-stage renal disease status post renal transplant, GERD, obstructive sleep apnea, stroke, rheumatoid arthritis, hyperlipidemia who comes in with abdominal pain and vomiting.  She had oral surgery yesterday, and took a dose of oxycodone before going to bed.  She woke up with nausea, vomiting and diaphoresis.  There has been some crampy periumbilical pain which does improve following emesis.  She denies constipation or diarrhea.  She denies any sick contacts.  She takes oxycodone on a regular basis for chronic pain and is never reacted like this in the past.  She came in by ambulance and got a dose of ondansetron with partial relief of nausea.  She denies fever or chills.  Abdominal pain is rated at 3/10.  Past Medical History:  Diagnosis Date  . Anxiety   . Bilateral carotid artery stenosis    Carotid duplex 08/173: 1-02% LICA, 58-52% RICA, >77% RECA, f/u 1 yr suggested  . CAD (coronary artery disease) of bypass graft 5/01; 3/'02, 8/'03, 10/'04; 1/15   PCI x 5 to SVG-D1   . CAD in native artery 07/1993    3 Vessel Disease (LAD-D1 & RCA) -- CABG  . CAD S/P percutaneous coronary angioplasty    PCI to SVG-D1 insertion/native D1 x 4 = '01 -(S660 BMS 2.5 x 9 - insertion into D1; '02 - distal overlap ACS Pixel 2.5 x 8  BMS; '03 distal/native ISR/Thrombosis - Pixel 2.5 x 13; '04 - ISR-  Taxus 2.5 x 20 (covered all);; 1/15 - mid SVG-D1 (50% distal ISR) - Promus P 2.75 x 20 -- 2.8 mm  . COPD mixed type ([place])    Followed by Dr. Lamonte Sakai "pulmonologist said no COPD"  .  Depression with anxiety   . Diabetes mellitus type 2 in obese ([place])   . Dyslipidemia, goal LDL below 70    08/2012: TC 137, TG 200, HDL 32!, LDL 45; on statin (followed by Dr.Deterding)  . ESRD (end stage renal disease) ([place]) 1991   s/p Cadaveric Renal Transplant [facility] - Dr. Jimmy Footman)   . Family history of adverse reaction to anesthesia    mom's bp dropped during/after anesthesia  . Fibromyalgia   . GERD (gastroesophageal reflux disease)   . Glomerulonephritis, chronic, rapidly progressive 49  . 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 07/1993   Rescue PTCA of RCA -- referred for CABG.  . H/O: GI bleed   . Hypertension associated with diabetes ([place])   . MRSA (methicillin resistant staph aureus) culture positive   . Obesity    s/p Lap Band 04/2004- Port Replacement 10/09 & 2/10 for infection; Dr. Excell Seltzer.  . OSA (obstructive sleep apnea)    no longer on CPAP or home O2  . PAD (peripheral artery disease) ([place]) 08/2013   LEA Dopplers to be read by Dr. Fletcher Anon  . PAF (paroxysmal atrial fibrillation) ([place]) 06/2014   Noted on CardioNet Monitor  - rate ~112; no recurrent symptoms. Nonionic regulation because of frequent GI procedures. Patient prefers Plavix  . Recurrent boils  Bilateral Groin  . Rheumatoid arthritis ([place])    Per Patient Report; associated with OA  . 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 Servando Snare: LIMA-LAD, SVG-bifurcatingD1, SVG-rPDA  . S/p cadaver renal transplant [place]  . Stroke [facility]) 2012   "right eye stroke- half blind now"  . Unstable angina ([place]) 5/01; 3/'02, 8/'03, 10/'04; 1/15   x 5 occurences since Inf-Post STEMI in 1994    Patient Active Problem List   Diagnosis Date Noted  . Primary osteoarthritis of right knee 08/18/2017  . Dyspnea 06/18/2017  . Aortic valve sclerosis 01/28/2017  . Preoperative cardiovascular examination 01/28/2017  . 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 Esophageal candidiasis ([place]) 10/20/2016  . Acute GI bleeding  10/14/2016  . Lung nodules 10/14/2016  . Anemia 10/14/2016  . Symptomatic anemia 10/13/2016  . Right rotator cuff tendinitis 05/19/2016  . Hip pain, bilateral 03/30/2016  . Bilateral knee pain 03/30/2016  . Fibromyalgia 03/30/2016  . Spondylosis of lumbar region without myelopathy or radiculopathy 03/30/2016  . Type 2 diabetes mellitus with peripheral neuropathy ([place]) 03/30/2016  . Bright red rectal bleeding 08/03/2014  . PAF (paroxysmal atrial fibrillation) ([place]) 06/28/2014  . Weakness 06/28/2014  . Palpitations 06/21/2014  . Fatigue 06/21/2014  . CAD in native artery 09/11/2013  . CAD S/P percutaneous coronary angioplasty - PCI x 5 to SVG-D1 09/11/2013    Class: Diagnosis of  . Hypertension associated with diabetes ([place])   . Chest pain 08/23/2013  . PAD (peripheral artery disease) ([place]) 08/17/2013  . Atherosclerosis of coronary artery bypass graft without angina pectoris 10/08/2012  . Stenosis of right carotid artery without infarction 10/08/2012  . Dyslipidemia, goal LDL below 70 10/08/2012    Class: Diagnosis of  . Mitral annular calcification 10/08/2012  . Leukocytosis 06/13/2012  . Hypomagnesemia 06/13/2012  . Hypokalemia 06/12/2012  . Fever 06/12/2012  . Cellulitis 06/12/2012  . Renal transplant disorder 06/12/2012  . Chronic allergic rhinitis 04/29/2011  . OTITIS EXTERNA, ACUTE 09/12/2007  . Sleep apnea 09/09/2007  . Extrinsic asthma 09/09/2007  . SINUSITIS, CHRONIC 09/09/2007  . GERD 09/09/2007  . COUGH, CHRONIC 09/09/2007  . Morbid obesity - s/p Lap Band 9/'05 05/07/2004    Class: Diagnosis of  . S/P CABG (coronary artery bypass graft) x 3 09/07/1993    Class: History of  . ST elevation myocardial infarction (STEMI) of inferoposterior wall; H/o -- Rescue PTCA of RCA -- referred for CABG 08/07/1993    Class: History of    Past Surgical History:  Procedure Laterality Date  . CARDIAC CATHETERIZATION  5/'01, 3/'02, 8/'03, 10/'04; 1/'15   08/22/2013: LAD & RCA  100%; LIMA-LAD & SVG-rPDA patent; Cx-- OM1 60%, OM2 ostial ~50%; SVG-D1 - 80% mid, 50% distal ISR --PCI  . CARDIAC CATHETERIZATION  5/'01, 3/'02, 8/'03, 10/'04; 1/'15   08/22/2013: LAD & RCA 100%; LIMA-LAD & SVG-rPDA patent; Cx-- OM1 60%, OM2 ostial ~50%; SVG-D1 - 80% mid, 50% distal ISR --PCI  . CATHETER REMOVAL    . CHOLECYSTECTOMY N/A 10/29/2014   Procedure: LAPAROSCOPIC CHOLECYSTECTOMY WITH INTRAOPERATIVE CHOLANGIOGRAM;  Surgeon: Excell Seltzer, MD;  Location: [facility] ORS;  Service: General;  Laterality: N/A;  . CORONARY ANGIOPLASTY  1994   x5  . CORONARY ARTERY BYPASS GRAFT  1995   LIMA-LAD, SVG-RPDA, SVG-D1  . ESOPHAGOGASTRODUODENOSCOPY N/A 10/15/2016   Procedure: ESOPHAGOGASTRODUODENOSCOPY (EGD);  Surgeon: Wilford Corner, MD;  Location: [facility] ENDOSCOPY;  Service: Endoscopy;  Laterality: N/A;  . INCISE AND DRAIN ABCESS    . KIDNEY TRANSPLANT  [place] GASTRIC BANDING  04/2004; 10/'09, 2/'10   Port Replacement x 2  . LEFT HEART CATHETERIZATION WITH CORONARY/GRAFT ANGIOGRAM N/A 08/23/2013   Procedure: LEFT HEART CATHETERIZATION WITH Beatrix Fetters;  Surgeon: Wellington Hampshire, MD;  Location: [facility] CATH LAB;  Service: Cardiovascular;  Laterality: N/A;  . Lower Extremity Arterial Dopplers  08/2013   ABI: R 0.96, L 1.04  . MULTIPLE TOOTH EXTRACTIONS  age 84  . NM MYOVIEW LTD  03/2016   EF 62%. LOW RISK. C/W prior MI - no Ischemia. Apical hypokinesis.  [name] Kitchen PERCUTANEOUS CORONARY STENT INTERVENTION (PCI-S)  5/'01, 3/'02, 8/'03, 10/'04;   '01 - S660 BMS 2.5 x 9 - dSVG-D1 into D1; '02- post-stent stenosis - 2.5 x 8 Pixel BMS; '8\03: ISR/Thrombosis into native D1 - AngioJet, 2.5 x 13 Pixel; '04 - ISR 95% - covered stented area with Taxus DES 2.5 mm x 20 (2.88)  . PERCUTANEOUS CORONARY STENT INTERVENTION (PCI-S)  08/22/2013   mid SVG-D1 80%; distal stent ~50% ISR; Promus Prermier DES 2.75 mm xc 20 mm (2.8 mm)  . PERCUTANEOUS CORONARY STENT INTERVENTION (PCI-S)  08/23/2013   Procedure:  PERCUTANEOUS CORONARY STENT INTERVENTION (PCI-S);  Surgeon: Wellington Hampshire, MD;  Location: [facility] CATH LAB;  Service: Cardiovascular;;  . PORT-A-CATH REMOVAL     kidney  . TRANSTHORACIC ECHOCARDIOGRAM  02/2016   EF 55-60%. Septal dyssynergy from CABG GR 2 DD. Aortic valve is trileaflet the functional bicuspid with sclerosis but not yet notable for stenosis stenosis.; no Significant change  . TUBAL LIGATION    . wrist fistula repair Left    dialysis for one year    OB History    No data available       Home Medications    Prior to Admission medications   Medication Sig Start Date End Date Taking? Authorizing Provider  acetaminophen (TYLENOL) 500 MG tablet Take 1 tablet (500 mg total) by mouth every 6 (six) hours as needed for moderate pain or headache. 10/17/16   Arrien, Jimmy Picket, MD  albuterol (VENTOLIN HFA) 108 (90 Base) MCG/ACT inhaler INHALE 2 PUFFS INTO THE LUNGS EVERY 6 (SIX) HOURS AS NEEDED FOR WHEEZING OR SHORTNESS OF BREATH. 06/14/17   Collene Gobble, MD  amoxicillin (AMOXIL) 500 MG tablet Take 2 tablets by mouth every 8 hours for 7 days. 09/09/17   Collene Gobble, MD  azaTHIOprine (IMURAN) 50 MG tablet Take 125 mg by mouth every evening.     [provider]  calcitRIOL (ROCALTROL) 0.25 MCG capsule Take 0.25 mcg by mouth every morning.     [provider]  carbamazepine (TEGRETOL) 200 MG tablet TAKE 1 TABLET BY MOUTH TWICE A DAY 07/27/17   Meredith Staggers, MD  clopidogrel (PLAVIX) 75 MG tablet Take 1 tablet (75 mg total) by mouth daily. 01/26/17   [name], MD  clotrimazole (MYCELEX) 10 MG troche Take 1 tablet (10 mg total) by mouth 5 (five) times daily. 10/21/16   Domenic Polite, MD  cyclobenzaprine (FLEXERIL) 10 MG tablet TAKE 1 TABLET BY MOUTH 3 TIMES DAILY AS NEEDED FOR MUSCLE SPASMS 07/14/17   Meredith Staggers, MD  diltiazem (CARDIZEM CD) 120 MG 24 hr capsule TAKE 1 CAPSULE (120 MG TOTAL) BY MOUTH DAILY. NEED OV. 02/22/17   [name], MD    DULoxetine (CYMBALTA) 30 MG capsule TAKE 2 CAPSULES (60 MG TOTAL) BY MOUTH EVERY MORNING. 09/10/17   Meredith Staggers, MD  famotidine (PEPCID) 20 MG tablet Take  20 mg by mouth daily. 05/19/17   [provider]  ferrous sulfate 325 (65 FE) MG tablet Take 1 tablet (325 mg total) by mouth 3 (three) times daily with meals. 10/17/16   Arrien, Jimmy Picket, MD  fluticasone [facility]) 50 MCG/ACT nasal spray PLACE 2 SPRAYS INTO THE NOSE DAILY 06/17/17   Collene Gobble, MD  glimepiride (AMARYL) 4 MG tablet Take 4 mg by mouth every morning. 04/20/16   [provider]  insulin glargine (LANTUS) 100 UNIT/ML injection Inject 8 Units into the skin See admin instructions. In the morning as needed for BGL of 160 or greater    [provider]  LORazepam (ATIVAN) 1 MG tablet Take 1 mg by mouth every morning.     [provider]  metFORMIN (GLUCOPHAGE) 500 MG tablet Take 500 mg by mouth 2 (two) times daily. 10/03/16   [provider]  metoprolol tartrate (LOPRESSOR) 25 MG tablet TAKE 1/2 TABLET BY MOUTH DAILY ( TAKE A WHOLE TABLET DAILY IF SBP>150) 07/06/17   [name], MD  montelukast (SINGULAIR) 10 MG tablet TAKE 1 TABLET (10 MG TOTAL) BY MOUTH DAILY. 07/20/17   Collene Gobble, MD  nitroGLYCERIN (NITROSTAT) 0.4 MG SL tablet Place 1 tablet (0.4 mg total) under the tongue every 5 (five) minutes as needed for chest pain. 09/11/13   [name], MD  omeprazole (PRILOSEC) 40 MG capsule Take 40 mg by mouth every morning.     [provider]  ondansetron (ZOFRAN ODT) 4 MG disintegrating tablet Take 1 tablet (4 mg total) by mouth every 8 (eight) hours as needed for nausea or vomiting. 10/21/16   Domenic Polite, MD  oxyCODONE (OXY IR/ROXICODONE) 5 MG immediate release tablet Take 1 tablet (5 mg total) by mouth every 8 (eight) hours as needed for severe pain. 08/02/17   Meredith Staggers, MD  predniSONE (DELTASONE) 10 MG tablet 4 tabs x 3 days, 3 tabs x 3 days, 2 tabs  x 3 days, 1 tabs x 3 days then stop 06/14/17   Collene Gobble, MD  predniSONE (DELTASONE) 10 MG tablet Take 2 tablets daily for 5 days, then STOP 09/09/17   Byrum, Rose Fillers, MD  predniSONE (DELTASONE) 5 MG tablet Take 5 mg by mouth every morning.     [provider]  promethazine (PHENERGAN) 25 MG tablet Take 25 mg by mouth at bedtime as needed for nausea or vomiting. For nausea and sleep    [provider]  rosuvastatin (CRESTOR) 20 MG tablet Take 1 tablet (20 mg total) by mouth daily. KEEP OV. 07/28/17   [name], MD  sertraline (ZOLOFT) 100 MG tablet Take 100 mg by mouth every morning.    [provider]  SYMBICORT 160-4.5 MCG/ACT inhaler Inhale 1 puff into the lungs 2 (two) times daily. 07/29/17   Collene Gobble, MD    Family History Family History  Problem Relation Age of Onset  . Cancer Mother        liver  . Heart disease Father   . Cancer Father        colon  . Arrhythmia Brother        Atrial Fibrillation  . Arrhythmia Paternal Aunt        Atrial Fibrillation    Social History Social History   Tobacco Use  . Smoking status: Former Smoker    Packs/day: 1.00    Years: 30.00    Pack years: 30.00    Types:  Cigarettes    Last attempt to quit: 08/17/2002    Years since quitting: 15.1  . Smokeless tobacco: Never Used  Substance Use Topics  . Alcohol use: No  . Drug use: No     Allergies   Tetracycline; Hydromorphone hcl; Niacin; Niaspan [niacin er]; Sulfa antibiotics; Sulfonamide derivatives; Codeine; Erythromycin; Hydromorphone hcl; Morphine and related; Nalbuphine; Sulfasalazine; and Tape   Review of Systems Review of Systems  All other systems reviewed and are negative.    Physical Exam Updated Vital Signs BP (!) 149/92   Pulse 90   Temp 97.6 F (36.4 C) (Axillary)   Resp (!) 23   Ht 5\' 1"  (1.549 m)   Wt 106.6 kg (235 lb)   SpO2 95%   BMI 44.40 kg/m   Physical Exam  Nursing note and vitals reviewed.  62 year old  female, resting comfortably and in no acute distress. Vital signs are significant for elevated blood pressure and tachypnea. Oxygen saturation is 95%, which is normal. Head is normocephalic and atraumatic. PERRLA, EOMI. Oropharynx is clear. Neck is nontender and supple without adenopathy or JVD. Back is nontender and there is no CVA tenderness. Lungs are clear without rales, wheezes, or rhonchi. Chest is nontender. Heart has regular rate and rhythm without murmur. Abdomen is soft, flat, nontender without masses or hepatosplenomegaly and peristalsis is hypoactive. Extremities have no cyanosis or edema, full range of motion is present. Skin is warm and dry without rash. Neurologic: Mental status is normal, cranial nerves are intact, there are no motor or sensory deficits.  ED Treatments / Results  Labs (all labs ordered are listed, but only abnormal results are displayed) Labs Reviewed  CBC - Abnormal; Notable for the following components:      Result Value   RBC 3.81 (*)    All other components within normal limits  URINALYSIS, ROUTINE W REFLEX MICROSCOPIC - Abnormal; Notable for the following components:   Glucose, UA 50 (*)    Bacteria, UA RARE (*)    All other components within normal limits  COMPREHENSIVE METABOLIC PANEL - Abnormal; Notable for the following components:   Glucose, Bld 147 (*)    All other components within normal limits  CBG MONITORING, ED - Abnormal; Notable for the following components:   Glucose-Capillary 139 (*)    All other components within normal limits  LIPASE, BLOOD    EKG  EKG Interpretation  Date/Time:  Tuesday September 21 2017 00:53:14 EST Ventricular Rate:  84 PR Interval:    QRS Duration: 151 QT Interval:  435 QTC Calculation: 515 R Axis:   -36 Text Interpretation:  Sinus rhythm Left bundle branch block When compared with ECG of 10/20/2016, Left bundle branch block is now present Confirmed by Delora Fuel ([id number]) on 09/21/2017 12:59:11 AM        Procedures Procedures  Medications Ordered in ED Medications  morphine 4 MG/ML injection 4 mg (not administered)  metoCLOPramide (REGLAN) injection 10 mg (10 mg Intravenous Given 09/21/17 0349)  diphenhydrAMINE (BENADRYL) injection 25 mg (25 mg Intravenous Given 09/21/17 0349)  sodium chloride 0.9 % bolus 1,000 mL (1,000 mLs Intravenous New Bag/Given 09/21/17 0351)     Initial Impression / Assessment and Plan / ED Course  I have reviewed the triage vital signs and the nursing notes.  Pertinent labs & imaging results that were available during my care of the patient were reviewed by me and considered in my medical decision making (see chart for details).  Nausea and vomiting in  patient postop from oral surgery procedure.  She chronically takes oxycodone, so this is not a side effect of narcotic administration.  She is given IV fluids.  She had partial relief of nausea with ondansetron and is given metoclopramide with excellent relief of nausea.  She started complaining of some recurrence of her oral pain and is given a dose of morphine.  Laboratory evaluation is reassuring.  Old records are reviewed, and she has no relevant past visits.  She is discharged with prescription for metoclopramide, follow-up with PCP and with her oral surgeon.  Final Clinical Impressions(s) / ED Diagnoses   Final diagnoses:  Non-intractable vomiting with nausea, unspecified vomiting type    ED Discharge Orders        Ordered    metoCLOPramide (REGLAN) 10 MG tablet  Every 6 hours PRN     50/35/46 [id number]       Delora Fuel, MD 27/51/70 [phone]

## 2017-09-21 NOTE — Discharge Instructions (Signed)
Continue taking the pain medication prescribed by your oral surgeon.

## 2017-09-21 NOTE — ED Notes (Signed)
Pt verbalizes understanding of d/c instructions. Pt received prescriptions. Pt taken to lobby in wheelchair at d/c with all belongings.   

## 2017-09-23 ENCOUNTER — Ambulatory Visit: Payer: Medicare Other | Admitting: Internal Medicine

## 2017-09-23 ENCOUNTER — Other Ambulatory Visit: Payer: Self-pay | Admitting: Cardiology

## 2017-09-23 NOTE — Telephone Encounter (Signed)
REFILL 

## 2017-09-27 DIAGNOSIS — N182 Chronic kidney disease, stage 2 (mild): Secondary | ICD-10-CM | POA: Diagnosis not present

## 2017-09-27 DIAGNOSIS — M1712 Unilateral primary osteoarthritis, left knee: Secondary | ICD-10-CM | POA: Diagnosis not present

## 2017-09-28 ENCOUNTER — Ambulatory Visit: Payer: Medicare Other | Admitting: Cardiology

## 2017-10-04 ENCOUNTER — Telehealth: Payer: Self-pay | Admitting: *Deleted

## 2017-10-04 ENCOUNTER — Encounter: Payer: Medicare Other | Attending: Registered Nurse | Admitting: Physical Medicine & Rehabilitation

## 2017-10-04 ENCOUNTER — Encounter: Payer: Self-pay | Admitting: Physical Medicine & Rehabilitation

## 2017-10-04 VITALS — BP 137/82 | HR 67

## 2017-10-04 DIAGNOSIS — Z7952 Long term (current) use of systemic steroids: Secondary | ICD-10-CM | POA: Diagnosis not present

## 2017-10-04 DIAGNOSIS — K219 Gastro-esophageal reflux disease without esophagitis: Secondary | ICD-10-CM | POA: Insufficient documentation

## 2017-10-04 DIAGNOSIS — Z951 Presence of aortocoronary bypass graft: Secondary | ICD-10-CM | POA: Insufficient documentation

## 2017-10-04 DIAGNOSIS — M069 Rheumatoid arthritis, unspecified: Secondary | ICD-10-CM | POA: Diagnosis not present

## 2017-10-04 DIAGNOSIS — I252 Old myocardial infarction: Secondary | ICD-10-CM | POA: Diagnosis not present

## 2017-10-04 DIAGNOSIS — Z9049 Acquired absence of other specified parts of digestive tract: Secondary | ICD-10-CM | POA: Insufficient documentation

## 2017-10-04 DIAGNOSIS — G894 Chronic pain syndrome: Secondary | ICD-10-CM | POA: Diagnosis not present

## 2017-10-04 DIAGNOSIS — Z8614 Personal history of Methicillin resistant Staphylococcus aureus infection: Secondary | ICD-10-CM | POA: Insufficient documentation

## 2017-10-04 DIAGNOSIS — E785 Hyperlipidemia, unspecified: Secondary | ICD-10-CM | POA: Diagnosis not present

## 2017-10-04 DIAGNOSIS — Z992 Dependence on renal dialysis: Secondary | ICD-10-CM | POA: Diagnosis not present

## 2017-10-04 DIAGNOSIS — Z8249 Family history of ischemic heart disease and other diseases of the circulatory system: Secondary | ICD-10-CM | POA: Insufficient documentation

## 2017-10-04 DIAGNOSIS — M47816 Spondylosis without myelopathy or radiculopathy, lumbar region: Secondary | ICD-10-CM

## 2017-10-04 DIAGNOSIS — G4733 Obstructive sleep apnea (adult) (pediatric): Secondary | ICD-10-CM | POA: Diagnosis not present

## 2017-10-04 DIAGNOSIS — Z9851 Tubal ligation status: Secondary | ICD-10-CM | POA: Insufficient documentation

## 2017-10-04 DIAGNOSIS — Z940 Kidney transplant status: Secondary | ICD-10-CM | POA: Insufficient documentation

## 2017-10-04 DIAGNOSIS — N186 End stage renal disease: Secondary | ICD-10-CM | POA: Insufficient documentation

## 2017-10-04 DIAGNOSIS — I120 Hypertensive chronic kidney disease with stage 5 chronic kidney disease or end stage renal disease: Secondary | ICD-10-CM | POA: Insufficient documentation

## 2017-10-04 DIAGNOSIS — Z955 Presence of coronary angioplasty implant and graft: Secondary | ICD-10-CM | POA: Insufficient documentation

## 2017-10-04 DIAGNOSIS — I2510 Atherosclerotic heart disease of native coronary artery without angina pectoris: Secondary | ICD-10-CM | POA: Insufficient documentation

## 2017-10-04 DIAGNOSIS — D631 Anemia in chronic kidney disease: Secondary | ICD-10-CM | POA: Insufficient documentation

## 2017-10-04 DIAGNOSIS — J449 Chronic obstructive pulmonary disease, unspecified: Secondary | ICD-10-CM | POA: Diagnosis not present

## 2017-10-04 DIAGNOSIS — Z9884 Bariatric surgery status: Secondary | ICD-10-CM | POA: Diagnosis not present

## 2017-10-04 DIAGNOSIS — E1122 Type 2 diabetes mellitus with diabetic chronic kidney disease: Secondary | ICD-10-CM | POA: Insufficient documentation

## 2017-10-04 DIAGNOSIS — E669 Obesity, unspecified: Secondary | ICD-10-CM | POA: Insufficient documentation

## 2017-10-04 DIAGNOSIS — Z87891 Personal history of nicotine dependence: Secondary | ICD-10-CM | POA: Diagnosis not present

## 2017-10-04 DIAGNOSIS — I6523 Occlusion and stenosis of bilateral carotid arteries: Secondary | ICD-10-CM | POA: Insufficient documentation

## 2017-10-04 DIAGNOSIS — M797 Fibromyalgia: Secondary | ICD-10-CM

## 2017-10-04 DIAGNOSIS — Z800 Family history of malignant neoplasm of digestive organs: Secondary | ICD-10-CM | POA: Insufficient documentation

## 2017-10-04 DIAGNOSIS — Z8673 Personal history of transient ischemic attack (TIA), and cerebral infarction without residual deficits: Secondary | ICD-10-CM | POA: Diagnosis not present

## 2017-10-04 DIAGNOSIS — Z808 Family history of malignant neoplasm of other organs or systems: Secondary | ICD-10-CM | POA: Insufficient documentation

## 2017-10-04 DIAGNOSIS — M1711 Unilateral primary osteoarthritis, right knee: Secondary | ICD-10-CM | POA: Diagnosis not present

## 2017-10-04 DIAGNOSIS — I480 Paroxysmal atrial fibrillation: Secondary | ICD-10-CM | POA: Diagnosis not present

## 2017-10-04 DIAGNOSIS — M7581 Other shoulder lesions, right shoulder: Secondary | ICD-10-CM

## 2017-10-04 MED ORDER — OXYCODONE HCL 5 MG PO TABS: 5.0000 mg | ORAL_TABLET | Freq: Three times a day (TID) | ORAL | 0 refills | Status: DC | PRN

## 2017-10-04 NOTE — Telephone Encounter (Signed)
Just saw Dr Naaman Plummer and he usually gives her 2 rx and she says she only got one. (his note says two). Please call. I spoke with her and she did not get a second rx ( no duplicate print showed).  I told her to call when she is about a week to a few days away from needing refill and [name] can send in and order electronically.

## 2017-10-04 NOTE — Progress Notes (Signed)
Subjective:    Patient ID: Katherine [name], female    DOB: May 19, 1956, 62 y.o.   MRN: [id number]  HPI  Katherine [name] is here in follow up of her chronic pain. She had minimal results with the left knee injeciton although the knee sleeve has been really helpful for pain and support with her ambulation/mobility. We reviewed her knee MRI which showed tricompartmental arthritis and left lateral meniscus injury as well as baker's cyst which is ruptured. Ortho has mentioned arthroscopic surgery to her which she's considering.   She is using oxycodone sparingly for pain control, typically a 1/2 tab daily.   Also reviewed her MRI which shows facet disease and DDD. ?involvement of left L4 nerve root.      Pain Inventory Average Pain 7 Pain Right Now 0 My pain is burning and aching  In the last 24 hours, has pain interfered with the following? General activity 7 Relation with others 7 Enjoyment of life 7 What TIME of day is your pain at its worst? daytime Sleep (in general) Fair  Pain is worse with: walking, bending, standing and some activites Pain improves with: rest, heat/ice and brace Relief from Meds: 7  Mobility walk without assistance use a cane how many minutes can you walk? 10 ability to climb steps?  yes do you drive?  yes transfers alone Do you have any goals in this area?  yes  Function not employed: date last employed 1989 disabled: date disabled 1989 I need assistance with the following:  household duties and shopping Do you have any goals in this area?  yes  Neuro/Psych numbness tingling trouble walking spasms depression anxiety  Prior Studies Any changes since last visit?  yes CT/MRI  Physicians involved in your care Any changes since last visit?  yes Orthopedist Dr. Lorenz Coaster    Family History  Problem Relation Age of Onset  . Cancer Mother        liver  . Heart disease Father   . Cancer Father        colon  . Arrhythmia Brother    Atrial Fibrillation  . Arrhythmia Paternal Aunt        Atrial Fibrillation   Social History   Socioeconomic History  . Marital status: Married    Spouse name: Not on file  . Number of children: Not on file  . Years of education: Not on file  . Highest education level: Not on file  Social Needs  . Financial resource strain: Not on file  . Food insecurity - worry: Not on file  . Food insecurity - inability: Not on file  . Transportation needs - medical: Not on file  . Transportation needs - non-medical: Not on file  Occupational History  . Not on file  Tobacco Use  . Smoking status: Former Smoker    Packs/day: 1.00    Years: 30.00    Pack years: 30.00    Types: Cigarettes    Last attempt to quit: 08/17/2002    Years since quitting: 15.1  . Smokeless tobacco: Never Used  Substance and Sexual Activity  . Alcohol use: No  . Drug use: No  . Sexual activity: Not on file  Other Topics Concern  . Not on file  Social History Narrative   She is currently married, and the caregiver of her husband who is recovering from surgery for tongue cancer now diagnosed with lung cancer. Prior to his diagnosis of her husband, she actually had adopted a 74-year-old  child who she knows caring for as well. With all the surgeries, they have been quite financially troubled. Thanks the help of her community and church, they have been able to stay "alfoat."     She is a former smoker who quit in 2004 after a 30-pack-year history.   She is active chasing a 76-year-old child, does not do routine exercise. She's been quite depressed with the condition of her husband, and admits to eating comfort herself.   She does not drink alcohol.   Past Surgical History:  Procedure Laterality Date  . CARDIAC CATHETERIZATION  5/'01, 3/'02, 8/'03, 10/'04; 1/'15   08/22/2013: LAD & RCA 100%; LIMA-LAD & SVG-rPDA patent; Cx-- OM1 60%, OM2 ostial ~50%; SVG-D1 - 80% mid, 50% distal ISR --PCI  . CARDIAC CATHETERIZATION  5/'01,  3/'02, 8/'03, 10/'04; 1/'15   08/22/2013: LAD & RCA 100%; LIMA-LAD & SVG-rPDA patent; Cx-- OM1 60%, OM2 ostial ~50%; SVG-D1 - 80% mid, 50% distal ISR --PCI  . CATHETER REMOVAL    . CHOLECYSTECTOMY N/A 10/29/2014   Procedure: LAPAROSCOPIC CHOLECYSTECTOMY WITH INTRAOPERATIVE CHOLANGIOGRAM;  Surgeon: Excell Seltzer, MD;  Location: [facility] ORS;  Service: General;  Laterality: N/A;  . CORONARY ANGIOPLASTY  1994   x5  . CORONARY ARTERY BYPASS GRAFT  1995   LIMA-LAD, SVG-RPDA, SVG-D1  . ESOPHAGOGASTRODUODENOSCOPY N/A 10/15/2016   Procedure: ESOPHAGOGASTRODUODENOSCOPY (EGD);  Surgeon: Wilford Corner, MD;  Location: [facility] ENDOSCOPY;  Service: Endoscopy;  Laterality: N/A;  . INCISE AND DRAIN ABCESS    . KIDNEY TRANSPLANT  1991  . LAPAROSCOPIC GASTRIC BANDING  04/2004; 10/'09, 2/'10   Port Replacement x 2  . LEFT HEART CATHETERIZATION WITH CORONARY/GRAFT ANGIOGRAM N/A 08/23/2013   Procedure: LEFT HEART CATHETERIZATION WITH Beatrix Fetters;  Surgeon: Wellington Hampshire, MD;  Location: [facility] CATH LAB;  Service: Cardiovascular;  Laterality: N/A;  . Lower Extremity Arterial Dopplers  08/2013   ABI: R 0.96, L 1.04  . MULTIPLE TOOTH EXTRACTIONS  age 58  . NM MYOVIEW LTD  03/2016   EF 62%. LOW RISK. C/W prior MI - no Ischemia. Apical hypokinesis.  [name] Kitchen PERCUTANEOUS CORONARY STENT INTERVENTION (PCI-S)  5/'01, 3/'02, 8/'03, 10/'04;   '01 - S660 BMS 2.5 x 9 - dSVG-D1 into D1; '02- post-stent stenosis - 2.5 x 8 Pixel BMS; '8\03: ISR/Thrombosis into native D1 - AngioJet, 2.5 x 13 Pixel; '04 - ISR 95% - covered stented area with Taxus DES 2.5 mm x 20 (2.88)  . PERCUTANEOUS CORONARY STENT INTERVENTION (PCI-S)  08/22/2013   mid SVG-D1 80%; distal stent ~50% ISR; Promus Prermier DES 2.75 mm xc 20 mm (2.8 mm)  . PERCUTANEOUS CORONARY STENT INTERVENTION (PCI-S)  08/23/2013   Procedure: PERCUTANEOUS CORONARY STENT INTERVENTION (PCI-S);  Surgeon: Wellington Hampshire, MD;  Location: [facility] CATH LAB;  Service: Cardiovascular;;  . PORT-A-CATH  REMOVAL     kidney  . TRANSTHORACIC ECHOCARDIOGRAM  02/2016   EF 55-60%. Septal dyssynergy from CABG GR 2 DD. Aortic valve is trileaflet the functional bicuspid with sclerosis but not yet notable for stenosis stenosis.; no Significant change  . TUBAL LIGATION    . wrist fistula repair Left    dialysis for one year   Past Medical History:  Diagnosis Date  . Anxiety   . Bilateral carotid artery stenosis    Carotid duplex 12/7844: 9-62% LICA, 95-28% RICA, >41% RECA, f/u 1 yr suggested  . CAD (coronary artery disease) of bypass graft 5/01; 3/'02, 8/'03, 10/'04; 1/15   PCI x 5 to SVG-D1   .  CAD in native artery 07/1993    3 Vessel Disease (LAD-D1 & RCA) -- CABG  . CAD S/P percutaneous coronary angioplasty    PCI to SVG-D1 insertion/native D1 x 4 = '01 -(S660 BMS 2.5 x 9 - insertion into D1; '02 - distal overlap ACS Pixel 2.5 x 8  BMS; '03 distal/native ISR/Thrombosis - Pixel 2.5 x 13; '04 - ISR-  Taxus 2.5 x 20 (covered all);; 1/15 - mid SVG-D1 (50% distal ISR) - Promus P 2.75 x 20 -- 2.8 mm  . COPD mixed type ([place])    Followed by Dr. Lamonte Sakai "pulmonologist said no COPD"  . Depression with anxiety   . Diabetes mellitus type 2 in obese ([place])   . Dyslipidemia, goal LDL below 70    08/2012: TC 137, TG 200, HDL 32!, LDL 45; on statin (followed by Dr.Deterding)  . ESRD (end stage renal disease) ([place]) 1991   s/p Cadaveric Renal Transplant [facility] - Dr. Jimmy Footman)   . Family history of adverse reaction to anesthesia    mom's bp dropped during/after anesthesia  . Fibromyalgia   . GERD (gastroesophageal reflux disease)   . Glomerulonephritis, chronic, rapidly progressive 25  . 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 07/1993   Rescue PTCA of RCA -- referred for CABG.  . H/O: GI bleed   . Hypertension associated with diabetes ([place])   . MRSA (methicillin resistant staph aureus) culture positive   . Obesity    s/p Lap Band 04/2004- Port Replacement 10/09 & 2/10 for  infection; Dr. Excell Seltzer.  . OSA (obstructive sleep apnea)    no longer on CPAP or home O2  . PAD (peripheral artery disease) ([place]) 08/2013   LEA Dopplers to be read by Dr. Fletcher Anon  . PAF (paroxysmal atrial fibrillation) ([place]) 06/2014   Noted on CardioNet Monitor  - rate ~112; no recurrent symptoms. Nonionic regulation because of frequent GI procedures. Patient prefers Plavix  . Recurrent boils    Bilateral Groin  . Rheumatoid arthritis ([place])    Per Patient Report; associated with OA  . 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 Servando Snare: LIMA-LAD, SVG-bifurcatingD1, SVG-rPDA  . S/p cadaver renal transplant [place]  . Stroke [facility]) 2012   "right eye stroke- half blind now"  . Unstable angina ([place]) 5/01; 3/'02, 8/'03, 10/'04; 1/15   x 5 occurences since Inf-Post STEMI in 1994   There were no vitals taken for this visit.  Opioid Risk Score:   Fall Risk Score:  `1  Depression screen PHQ 2/9  Depression screen [facility] 2/9 03/29/2017 05/19/2016 03/30/2016  Decreased Interest 3 3 3   Down, Depressed, Hopeless 3 3 3   PHQ - 2 Score 6 6 6   Altered sleeping - - 3  Tired, decreased energy - - 3  Change in appetite - - 2  Feeling bad or failure about yourself  - - 3  Trouble concentrating - - 2  Moving slowly or fidgety/restless - - 0  Suicidal thoughts - - 0  PHQ-9 Score - - 19  Some recent data might be hidden    Review of Systems  Constitutional: Positive for appetite change and unexpected weight change.  Respiratory: Positive for cough, shortness of breath and wheezing.   Gastrointestinal: Positive for abdominal pain.  Endocrine:       High blood sugar   Musculoskeletal: Positive for gait problem and joint swelling.  Neurological: Positive for numbness.       Tingling Spasms  Psychiatric/Behavioral:  Positive for dysphoric mood. The patient is nervous/anxious.   All other systems reviewed and are negative.      Objective:   Physical Exam General: Alert and oriented x 3, No apparent distress.  Sheremainsoverweight HEENT:PERRL, EOMI Neck:supple Heart:RRR Chest:CTA Abdomen:Soft, non-tender, non-distended, bowel sounds positive. Extremities:pulses2+ Skin:Clean and intact without signs of breakdown. Has dry skin in places, diffuse lesions on lgs Neuro:CN normal. Motor 5/5. No sensory loss Musculoskeletal:right shoulder with improved painwithIR/ER/ABD. I. . She has no gross rheumatoid deformities in the hands or feet. low back tender especially in RLQ with palpation once again. Facet maneuvers +. SST was equivocal with hamstring tightness. SLR the same. Both hamstrings remain tight with lumbar flexion.  Both knees with effusions, lleft more affectd than right. +mcmurray's. along left lateral knee.  She is antalgic on both sides.  She is wearing the left knee sleeve today. Psych:Pleasant and cooperative       Assessment & Plan:  1. Fibromyalgia 2. Rheumatoid Arthritis by history 3. Osteoarthritis--polyarticular, bilateral knees quite involved, left more than right 4. ESRD with hx of renal transplant on chronic steroids 5. Right rotator cuff tendonitis/like degenerative/inflammatory changes in the right shoulder also.  6. Hx o falls,gait disorder 7. Right low back pain with radicular symptoms. ?spondylosis per recent plain film   Plan: 1. Continues to be a fall risk due to her balance, pain.    2. oxycodone 5mg  q8 prn, #60-- Second rx was provided -.We will continue the opioid monitoring program, this consists of regular clinic visits, examinations, routine drug screening, pill counts as well as use of [place] Controlled Substance Reporting System. [place].   3. Continuecymbalta to 60mg  daily,continuezoloftat100mg as well for now. Would still like toweanzoloft to offmoving forward given high risk of serotonin syndrome.  4. Continue tegretol200mg  BID for FMS/neuropathic pain 6. Left knee mgt. ?arthroscopic surgery  per Dr. Gillermina Hu  7.  Consider referral to Dr. Sima Matas regarding pain/coping skills given her multiple losses and chronic pain.  She is profoundly affected by stresses and other issues around her home in life. 8.Reviewed MRI of lumbar spine and knee at length today.     18minutes of face to face patient care time were spent during this visit. All questions were encouraged and answered. Follow up with NP in about 3 months.

## 2017-10-06 ENCOUNTER — Telehealth: Payer: Self-pay | Admitting: *Deleted

## 2017-10-06 NOTE — Telephone Encounter (Signed)
Dahna needs another copy of "his diagnosis of the MRI", Raliegh Ip Caffrey's office has lost that paper and needs the letter that he wrote for me stating how long I can stand and walk etc. She also wants Dr Naaman Plummer to know the nurse scheduling the surgery "keeps calling it an elective surgery like she is just going in for a face lift".  I called ans spoke with Tiea because I see the letter about her activities, but unclear about the MRI.  She says "Dr Naaman Plummer will know what I am talking about".  Dr French Ana kept both and she needs a copy for her records and he will know what I am talking about..."   I will forward message to Dr Naaman Plummer.

## 2017-10-06 NOTE — Telephone Encounter (Signed)
Honestly, I'm not sure what she's referring to. We discussed that an arthroscopic surgery would be an appropriate option for her as the "next step" (before a knee replacement). Beyond that, there were no other discussions that i'm aware of about dr. Dorothey Baseman

## 2017-10-06 NOTE — Telephone Encounter (Signed)
Letter mailed to [facility] with note that she will have to discuss MRI question at appointment wit Dr Naaman Plummer since he is unaware of what she is referencing.

## 2017-10-07 ENCOUNTER — Ambulatory Visit (INDEPENDENT_AMBULATORY_CARE_PROVIDER_SITE_OTHER): Payer: Medicare Other | Admitting: Cardiology

## 2017-10-07 ENCOUNTER — Telehealth: Payer: Self-pay | Admitting: Emergency Medicine

## 2017-10-07 ENCOUNTER — Encounter: Payer: Self-pay | Admitting: Cardiology

## 2017-10-07 VITALS — BP 138/94 | HR 90 | Ht 61.0 in | Wt 235.0 lb

## 2017-10-07 DIAGNOSIS — Z01810 Encounter for preprocedural cardiovascular examination: Secondary | ICD-10-CM

## 2017-10-07 DIAGNOSIS — I2510 Atherosclerotic heart disease of native coronary artery without angina pectoris: Secondary | ICD-10-CM

## 2017-10-07 DIAGNOSIS — E785 Hyperlipidemia, unspecified: Secondary | ICD-10-CM | POA: Diagnosis not present

## 2017-10-07 DIAGNOSIS — I25810 Atherosclerosis of coronary artery bypass graft(s) without angina pectoris: Secondary | ICD-10-CM

## 2017-10-07 DIAGNOSIS — I10 Essential (primary) hypertension: Secondary | ICD-10-CM | POA: Diagnosis not present

## 2017-10-07 DIAGNOSIS — I358 Other nonrheumatic aortic valve disorders: Secondary | ICD-10-CM | POA: Diagnosis not present

## 2017-10-07 DIAGNOSIS — I480 Paroxysmal atrial fibrillation: Secondary | ICD-10-CM | POA: Diagnosis not present

## 2017-10-07 DIAGNOSIS — E1159 Type 2 diabetes mellitus with other circulatory complications: Secondary | ICD-10-CM | POA: Diagnosis not present

## 2017-10-07 DIAGNOSIS — I2119 ST elevation (STEMI) myocardial infarction involving other coronary artery of inferior wall: Secondary | ICD-10-CM | POA: Diagnosis not present

## 2017-10-07 DIAGNOSIS — I447 Left bundle-branch block, unspecified: Secondary | ICD-10-CM | POA: Insufficient documentation

## 2017-10-07 DIAGNOSIS — Z9861 Coronary angioplasty status: Secondary | ICD-10-CM

## 2017-10-07 MED ORDER — BUDESONIDE-FORMOTEROL FUMARATE 160-4.5 MCG/ACT IN AERO: 2.0000 | INHALATION_SPRAY | Freq: Two times a day (BID) | RESPIRATORY_TRACT | 0 refills | Status: DC

## 2017-10-07 NOTE — Progress Notes (Signed)
PCP: [name], MD  Clinic Note: Chief Complaint  Patient presents with  . Pre-op Exam    L Knee Sgx  . Coronary Artery Disease    CABG-PCI - no active Sx    HPI: Katherine [name] is a 62 y.o. female with a PMH below who presents today for what amounts to be an 35-month follow-up and for preoperative risk assessment.Katherine [name] is a former patient of Dr. Terance Ice with a history of CAD-CABG followed by PCI as well as PAD/carotid disease and PAF.  She has a history of being morbidly obese with a history of lap band surgery with very labile weights based on whether or not her band is tight and or not.  She also is status post renal transplant for ESRD dating back to 60.  Additionally has COPD, poorly controlled diabetes mellitus, type II as well as hypertension and hyperlipidemia.   Cardiac history dates back to 1994 -->  Inferior-posterior STEMI-> found to have three-vessel disease involving LAD-D1 bifurcation as well as RCA -> after initially having PTCA of the RCA, she eventually went for for CABGx3: LIMA-LAD, SVG-D1, SVG-RPDA.  Since 89 she has had several heart catheterizations for symptoms concerning for unstable angina -in 2001, 2002, 2003 than 2004 all with lesions in the SVG-D1.  Initial stent followed by's are followed by stent thrombosis..  Most recent Cath-PCI for UA was in January 2015: Native LAD and RCA 100% occluded.  LIMA-LAD and SVG-RPDA patent.  In the Circumflex, OM1 had 60% and OM 2 ostial 50% with normal OM 3.  SVG-D1 had 80% mid and 50% distal ISR --> 1- PCI with a Promus Premier DES 2.75 mm x 20 mm.  Most recent Myoview August 2017 for preop: EF 60%.  Small anteroapical infarct but no ischemia.  Mild apical hypokinesis.  EF 62%.  LOW RISK  Most recent Echo July 2017: Normal LV size and function.  Post CABG septal hypokinesis.  EF 55-60%.  GRII DD.  Functionally bicuspid AoV -but no significant stenosis.  (Mean-peak gradient 9-16  mmHg)  Katherine [name] was last seen back in June 2018 for preoperative risk assessment for an abdominal surgery done at [facility] (removal of adnexal/ovarian mass).  She was relatively stable from a cardiac standpoint without any cardiac symptoms.  Most of her symptoms are related to whether or not her lap band was tightened or not.  When loosened, she is able to gain weight and be stronger, but her weight does go up significantly as though her lipid and A1c numbers.  When she has her band tightened, she has to deal with significant nausea and diarrhea, but her weight goes down and her glycemic/lipid control notably improved. -Recommendation was to proceed to the OR without further evaluation  Recent Hospitalizations:  September 21, 2017 ER visit for significant vomiting and now diarrhea.  Studies Personally Reviewed - (if available, images/films reviewed: From Epic Chart or Care Everywhere)  None since last visit  Interval History: [name] presents back here today for follow-up in for preop assessment for left knee surgery.  Her knee is hurting her quite a bit and is probably why her blood pressure and heart rate are up today.  She is also recovering from upper respiratory tract infection and recent bout of diarrhea likely related to her lap band.  Otherwise really from a cardiac standpoint besides noting that her heart rate tends to go up easier with pain that had been (probably related to  her diarrhea), she really is doing fine.  She denies any chest tightness or pressure with rest or exertion.  Her exertion is limited by her knee pain which causes significant amount of pain and  makes her more short of breath.  Although she feels her heart rate going up, she denies Any regular palpitations, syncope or near syncope..  Although she has had a lot of diarrhea and vomiting, she denies any melena, hematochezia or hematemesis.  No hematuria.  No claudication.  No syncope/near syncope or TIA/amaurosis fugax  symptoms  ROS: A comprehensive was performed. Review of Systems  Constitutional: Positive for malaise/fatigue (With recent URI and diarrhea issues). Negative for chills and fever.  HENT: Negative for hearing loss.   Respiratory: Negative for cough and shortness of breath.   Cardiovascular:       Per HPI  Gastrointestinal: Positive for diarrhea, nausea and vomiting.  Musculoskeletal: Positive for back pain and joint pain (Left knee pain in her brace today.).  Neurological: Positive for dizziness (Because being a little bit dehydrated.). Negative for tremors.  Psychiatric/Behavioral: Negative for depression and memory loss. The patient is not nervous/anxious and does not have insomnia.   All other systems reviewed and are negative.   I have reviewed and (if needed) personally updated the patient's problem list, medications, allergies, past medical and surgical history, social and family history.   Past Medical History:  Diagnosis Date  . Anxiety   . Bilateral carotid artery stenosis    Carotid duplex 09/8411: 2-44% LICA, 01-02% RICA, >72% RECA, f/u 1 yr suggested  . CAD (coronary artery disease) of bypass graft 5/01; 3/'02, 8/'03, 10/'04; 1/15   PCI x 5 to SVG-D1   . CAD in native artery 07/1993    3 Vessel Disease (LAD-D1 & RCA) -- CABG  . CAD S/P percutaneous coronary angioplasty    PCI to SVG-D1 insertion/native D1 x 4 = '01 -(S660 BMS 2.5 x 9 - insertion into D1; '02 - distal overlap ACS Pixel 2.5 x 8  BMS; '03 distal/native ISR/Thrombosis - Pixel 2.5 x 13; '04 - ISR-  Taxus 2.5 x 20 (covered all);; 1/15 - mid SVG-D1 (50% distal ISR) - Promus P 2.75 x 20 -- 2.8 mm  . COPD mixed type ([place])    Followed by Dr. Lamonte Sakai "pulmonologist said no COPD"  . Depression with anxiety   . Diabetes mellitus type 2 in obese ([place])   . Dyslipidemia, goal LDL below 70    08/2012: TC 137, TG 200, HDL 32!, LDL 45; on statin (followed by Dr.Deterding)  . ESRD (end stage renal disease) ([place]) 1991   s/p  Cadaveric Renal Transplant [facility] - Dr. Jimmy Footman)   . Family history of adverse reaction to anesthesia    mom's bp dropped during/after anesthesia  . Fibromyalgia   . GERD (gastroesophageal reflux disease)   . Glomerulonephritis, chronic, rapidly progressive 69  . 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 07/1993   Rescue PTCA of RCA -- referred for CABG.  . H/O: GI bleed   . Hypertension associated with diabetes ([place])   . MRSA (methicillin resistant staph aureus) culture positive   . Obesity    s/p Lap Band 04/2004- Port Replacement 10/09 & 2/10 for infection; Dr. Excell Seltzer.  . OSA (obstructive sleep apnea)    no longer on CPAP or home O2  . PAD (peripheral artery disease) ([place]) 08/2013   LEA Dopplers to be read by Dr. Fletcher Anon  . PAF (paroxysmal atrial fibrillation) (  [place]) 06/2014   Noted on CardioNet Monitor  - rate ~112; no recurrent symptoms. Nonionic regulation because of frequent GI procedures. Patient prefers Plavix  . Recurrent boils    Bilateral Groin  . Rheumatoid arthritis ([place])    Per Patient Report; associated with OA  . 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 Servando Snare: LIMA-LAD, SVG-bifurcatingD1, SVG-rPDA  . S/p cadaver renal transplant [place]  . Stroke [facility]) 2012   "right eye stroke- half blind now"  . Unstable angina ([place]) 5/01; 3/'02, 8/'03, 10/'04; 1/15   x 5 occurences since Inf-Post STEMI in 1994    Past Surgical History:  Procedure Laterality Date  . CARDIAC CATHETERIZATION  5/'01, 3/'02, 8/'03, 10/'04; 1/'15   08/22/2013: LAD & RCA 100%; LIMA-LAD & SVG-rPDA patent; Cx-- OM1 60%, OM2 ostial ~50%; SVG-D1 - 80% mid, 50% distal ISR --PCI  . CATHETER REMOVAL    . CHOLECYSTECTOMY N/A 10/29/2014   Procedure: LAPAROSCOPIC CHOLECYSTECTOMY WITH INTRAOPERATIVE CHOLANGIOGRAM;  Surgeon: Excell Seltzer, MD;  Location: [facility] ORS;  Service: General;  Laterality: N/A;  . CORONARY ANGIOPLASTY  1994   x5  . CORONARY ARTERY BYPASS GRAFT  1995   LIMA-LAD,  SVG-RPDA, SVG-D1  . ESOPHAGOGASTRODUODENOSCOPY N/A 10/15/2016   Procedure: ESOPHAGOGASTRODUODENOSCOPY (EGD);  Surgeon: Wilford Corner, MD;  Location: [facility] ENDOSCOPY;  Service: Endoscopy;  Laterality: N/A;  . INCISE AND DRAIN ABCESS    . KIDNEY TRANSPLANT  1991  . LAPAROSCOPIC GASTRIC BANDING  04/2004; 10/'09, 2/'10   Port Replacement x 2  . LEFT HEART CATHETERIZATION WITH CORONARY/GRAFT ANGIOGRAM N/A 08/23/2013   Procedure: LEFT HEART CATHETERIZATION WITH Beatrix Fetters;  Surgeon: Wellington Hampshire, MD;  Location: [facility] CATH LAB;  Service: Cardiovascular;  Laterality: N/A;  . Lower Extremity Arterial Dopplers  08/2013   ABI: R 0.96, L 1.04  . MULTIPLE TOOTH EXTRACTIONS  age 89  . NM MYOVIEW LTD  03/2016   EF 62%. LOW RISK. C/W prior MI - no Ischemia. Apical hypokinesis.  [name] Kitchen PERCUTANEOUS CORONARY STENT INTERVENTION (PCI-S)  5/'01, 3/'02, 8/'03, 10/'04;   '01 - S660 BMS 2.5 x 9 - dSVG-D1 into D1; '02- post-stent stenosis - 2.5 x 8 Pixel BMS; '8\03: ISR/Thrombosis into native D1 - AngioJet, 2.5 x 13 Pixel; '04 - ISR 95% - covered stented area with Taxus DES 2.5 mm x 20 (2.88)  . PERCUTANEOUS CORONARY STENT INTERVENTION (PCI-S)  08/23/2013   Procedure: PERCUTANEOUS CORONARY STENT INTERVENTION (PCI-S);  Surgeon: Wellington Hampshire, MD;  Location: [facility] CATH LAB;  Service: Cardiovascular;;mid SVG-D1 80%; distal stent ~50% ISR; Promus Prermier DES 2.75 mm xc 20 mm (2.8 mm)  . PORT-A-CATH REMOVAL     kidney  . TRANSTHORACIC ECHOCARDIOGRAM  02/2016   EF 55-60%. Septal dyssynergy from CABG GR 2 DD. Aortic valve is trileaflet the functional bicuspid with sclerosis but not yet notable for stenosis stenosis.; no Significant change  . TUBAL LIGATION    . wrist fistula repair Left    dialysis for one year    Current Meds  Medication Sig  . acetaminophen (TYLENOL) 500 MG tablet Take 1 tablet (500 mg total) by mouth every 6 (six) hours as needed for moderate pain or headache.  . albuterol (VENTOLIN HFA) 108 (90  Base) MCG/ACT inhaler INHALE 2 PUFFS INTO THE LUNGS EVERY 6 (SIX) HOURS AS NEEDED FOR WHEEZING OR SHORTNESS OF BREATH.  [name] Kitchen azaTHIOprine (IMURAN) 50 MG tablet Take 125 mg by mouth every evening.   . calcitRIOL (ROCALTROL) 0.25 MCG capsule Take 0.25  mcg by mouth See admin instructions. Every three days  . carbamazepine (TEGRETOL) 200 MG tablet TAKE 1 TABLET BY MOUTH TWICE A DAY  . clopidogrel (PLAVIX) 75 MG tablet Take 1 tablet (75 mg total) by mouth daily.  . clotrimazole (MYCELEX) 10 MG troche Take 1 tablet (10 mg total) by mouth 5 (five) times daily. (Patient taking differently: Take 10 mg by mouth daily as needed (thrush). )  . cyclobenzaprine (FLEXERIL) 10 MG tablet TAKE 1 TABLET BY MOUTH 3 TIMES DAILY AS NEEDED FOR MUSCLE SPASMS  . diltiazem (CARDIZEM CD) 120 MG 24 hr capsule TAKE 1 CAPSULE (120 MG TOTAL) BY MOUTH DAILY. NEED OV.  . DULoxetine (CYMBALTA) 30 MG capsule TAKE 2 CAPSULES (60 MG TOTAL) BY MOUTH EVERY MORNING.  . famotidine (PEPCID) 20 MG tablet Take 20 mg by mouth daily.  . ferrous sulfate 325 (65 FE) MG tablet Take 1 tablet (325 mg total) by mouth 3 (three) times daily with meals. (Patient taking differently: Take 325 mg by mouth 2 (two) times daily. )  . fluticasone (FLONASE) 50 MCG/ACT nasal spray PLACE 2 SPRAYS INTO THE NOSE DAILY (Patient taking differently: Place 2 sprays into both nostrils 2 (two) times daily. )  . glimepiride (AMARYL) 4 MG tablet Take 4 mg by mouth every morning.  . insulin glargine (LANTUS) 100 UNIT/ML injection Inject 8-12 Units into the skin See admin instructions. In the morning as needed for BGL of 160 or greater   . LORazepam (ATIVAN) 1 MG tablet Take 1 mg by mouth every morning.   . metFORMIN (GLUCOPHAGE) 500 MG tablet Take 500 mg by mouth 2 (two) times daily.  . metoCLOPramide (REGLAN) 10 MG tablet Take 1 tablet (10 mg total) by mouth every 6 (six) hours as needed for nausea (or headache).  . metoprolol tartrate (LOPRESSOR) 25 MG tablet TAKE 1/2 TABLET  BY MOUTH DAILY ( TAKE A WHOLE TABLET DAILY IF SBP>150)  . montelukast (SINGULAIR) 10 MG tablet TAKE 1 TABLET (10 MG TOTAL) BY MOUTH DAILY.  . nitroGLYCERIN (NITROSTAT) 0.4 MG SL tablet Place 1 tablet (0.4 mg total) under the tongue every 5 (five) minutes as needed for chest pain.  [name] Kitchen omeprazole (PRILOSEC) 40 MG capsule Take 40 mg by mouth every morning.   [name] Kitchen oxyCODONE (OXY IR/ROXICODONE) 5 MG immediate release tablet Take 1 tablet (5 mg total) by mouth every 8 (eight) hours as needed for severe pain.  . predniSONE (DELTASONE) 10 MG tablet Take 2 tablets daily for 5 days, then STOP (Patient taking differently: Take 5 mg by mouth daily. Take 2 tablets daily for 5 days, then STOP)  . promethazine (PHENERGAN) 25 MG tablet Take 25 mg by mouth at bedtime as needed for nausea or vomiting. For nausea and sleep  . rosuvastatin (CRESTOR) 20 MG tablet Take 1 tablet (20 mg total) by mouth daily. KEEP OV.  . sertraline (ZOLOFT) 100 MG tablet Take 100 mg by mouth every morning.  . SYMBICORT 160-4.5 MCG/ACT inhaler Inhale 1 puff into the lungs 2 (two) times daily.    Allergies  Allergen Reactions  . Tetracycline Hives  . Hydromorphone Hcl Nausea And Vomiting  . Niacin     Other reaction(s): Other (See Comments) Mouth blisters  . Niaspan [Niacin Er] Other (See Comments)    Mouth blisters  . Sulfa Antibiotics Nausea Only and Other (See Comments)    "Tears up stomach"  . Sulfonamide Derivatives Other (See Comments)    Reaction: per patient "tears her stomach up"  .  Codeine Nausea And Vomiting  . Erythromycin Nausea And Vomiting  . Hydromorphone Hcl Nausea And Vomiting  . Morphine And Related Nausea And Vomiting  . Nalbuphine Nausea And Vomiting  . Sulfasalazine Nausea Only    Other reaction(s): Other (See Comments) Reaction: per patient "tears her stomach up" Other reaction(s): Other (See Comments) "Tears up stomach"  . Tape Rash    No "plastic" tape," please No "plastic" tape," please    Social  History   Tobacco Use  . Smoking status: Former Smoker    Packs/day: 1.00    Years: 30.00    Pack years: 30.00    Types: Cigarettes    Last attempt to quit: 08/17/2002    Years since quitting: 15.1  . Smokeless tobacco: Never Used  Substance Use Topics  . Alcohol use: No  . Drug use: No   Social History   Social History Narrative   She is currently married, and the caregiver of her husband who is recovering from surgery for tongue cancer now diagnosed with lung cancer. Prior to his diagnosis of her husband, she actually had adopted a 2-year-old child who she knows caring for as well. With all the surgeries, they have been quite financially troubled. Thanks the help of her community and church, they have been able to stay "alfoat."     She is a former smoker who quit in 2004 after a 30-pack-year history.   She is active chasing a 50-year-old child, does not do routine exercise. She's been quite depressed with the condition of her husband, and admits to eating comfort herself.   She does not drink alcohol.    family history includes Arrhythmia in her brother and paternal aunt; Cancer in her father and mother; Heart disease in her father.  Wt Readings from Last 3 Encounters:  10/07/17 235 lb (106.6 kg)  09/21/17 235 lb (106.6 kg)  06/18/17 228 lb (103.4 kg)  - not able to walk around much & Lap Band loosened  (In June 2018 - 206 Lb.)  PHYSICAL EXAM BP (!) 138/94   Pulse 90   Ht 5\' 1"  (1.549 m)   Wt 235 lb (106.6 kg)   BMI 44.40 kg/m  Physical Exam  Constitutional: She is oriented to person, place, and time. She appears well-developed and well-nourished. No distress.  Morbidly obese, but her weight is down from last month, but still up from June.  Her pain was recently tightened  HENT:  Head: Normocephalic and atraumatic.  Neck: No hepatojugular reflux and no JVD present. Carotid bruit is not present.  Cardiovascular: Normal rate and regular rhythm.  No extrasystoles are  present. PMI is not displaced (Unable to palpate). Exam reveals distant heart sounds. Exam reveals no gallop (Cannot exclude soft S4).  Murmur heard.  Harsh crescendo-decrescendo midsystolic murmur is present with a grade of 2/6 at the upper right sternal border radiating to the neck. High-pitched blowing holosystolic murmur of grade 1/6 is also present at the apex. Very soft Pulmonary/Chest: Effort normal and breath sounds normal. No respiratory distress. She has no wheezes. She has no rales.  Abdominal: Soft. Bowel sounds are normal. She exhibits no distension. There is no tenderness. There is no rebound.  Musculoskeletal: She exhibits edema (Trivial).  Left knee in a brace.  Somewhat swollen and painful.  Antalgic gait.  Neurological: She is alert and oriented to person, place, and time.  Psychiatric: She has a normal mood and affect. Her behavior is normal. Judgment and thought content normal.  Nursing note and vitals reviewed.   Adult ECG Report  Rate: 90 ;  Rhythm: normal sinus rhythm and LBBB (seen 09/21/2016);   Narrative Interpretation: Relatively new - Asymptomatic LBBB    Other studies Reviewed: Additional studies/ records that were reviewed today include:  Recent Labs:   Lab Results  Component Value Date   CREATININE 0.83 09/21/2017   BUN 12 09/21/2017   NA 138 09/21/2017   K 3.9 09/21/2017   CL 101 09/21/2017   CO2 22 09/21/2017   No results found for: CHOL, HDL, LDLCALC, LDLDIRECT, TRIG, CHOLHDL     ASSESSMENT / PLAN: Problem List Items Addressed This Visit    Aortic valve sclerosis (Chronic)    There is a suggestion of possible functional bicuspid valve on last echo.  With new left bundle branch block we will go ahead and recheck an echocardiogram to ensure no evidence of aortic stenosis, but also to look for structural normalities with new left bundle branch block.      Relevant Orders   ECHOCARDIOGRAM COMPLETE   Atherosclerosis of coronary artery bypass graft  without angina pectoris (Chronic)    No recurrent anginal symptoms since her last PCI in 2015.  Negative Myoview in 2017.   The only abnormal finding is a new left bundle branch block which is not likely to be a new ischemic issue based on those symptoms. Plan: Continue Plavix without aspirin, combination of beta-blocker and diltiazem (partly because of A. fib rate controlled), and statin.      CAD S/P percutaneous coronary angioplasty - PCI x 5 to SVG-D1 (Chronic)    Multiple stents in the SVG-diagonal branch.  She is far enough out from her last event where she would be fine holding Plavix for 5-7 days preop and restart postop.  I do want to keep her on a Thienopyridine lifelong however given the extent of stent within stent in that vein graft.      Relevant Orders   ECHOCARDIOGRAM COMPLETE   Dyslipidemia, goal LDL below 70 (Chronic)    I have not seen recent labs, they have been followed by PCP.  She remains on Crestor.  Would like to see labs from PCP to ensure that she is within goal.      Hypertension associated with diabetes ([place]) (Chronic)    Blood pressure looks well controlled on beta-blocker and calcium channel blocker, little bit high today Because of significant leg pain walking in to the office.      LBBB (left bundle branch block)    This was first noted earlier this month.  She has had some borderline IVCD in the past, but this the first time has been a true left bundle branch blocks seen.  Clearly not associated with a ACS presentation, therefore is most likely related to progression conduction delay.  Plan: Check 2D echo just to ensure there is no new structural abnormality explaining the bundle branch block.  Would also like to ensure that her EF is stable.  In the absence of anginal symptoms, I would not stress testing.      Relevant Orders   EKG 12-Lead (Completed)   ECHOCARDIOGRAM COMPLETE   Morbid obesity - s/p Lap Band 9/'05 (Chronic)    Her weight really  depends on whether or not the lap band is tightened or not.  Unfortunately although her numbers and weight all look great with her band is tight and, she has to suffer through significant abdominal pain and nausea/vomiting.  PAF (paroxysmal atrial fibrillation) ([facility]) (Chronic)    No recurrence since her unstable angina presentation in 2015.  We talked again about anticoagulation, and she would prefer to stay on Plavix simply because of her need for several procedural interventions during the course of the year.  In the absence of recurrence, we are okay waiting discussed. She is on beta-blocker and calcium channel blocker for rate control. -->  If there is recurrence perioperatively, we may want to consider readdressing the concept of anticoagulation.      Relevant Orders   EKG 12-Lead (Completed)   ECHOCARDIOGRAM COMPLETE   Preoperative cardiovascular examination - Primary    Again [place] is very complicated.  The key point to note is that knee surgery is a low risk surgery from a cardiac standpoint, and she has not had any active angina or heart failure symptoms in several years now.  Her last intervention was in 2015 and she has had a negative Myoview since then. Because of her left bundle branch block, I am checking a 2D echocardiogram however this is simply to n set o a new baseline, and should not adversely affect preoperative assessment.  She does have diabetes on insulin, she has known history of ESRD, but recent renal function has been stable with her transplanted kidney, she also has a history of TIA. -->  The some of these risk factors which cannot be otherwise adjusted, She Was Still Be Intermediate to High Risk based on revised cardiac risk index scoring.  She is on a beta-blocker which does mitigate that risk.  In the absence of ongoing symptoms of angina or heart failure, I would not change my management unless her echocardiogram shows a new significant wall motion normality.   For now I would prefer to avoid Myoview stress test unless her echocardiogram shows new wall motion normality besides septal dysfunction/dyssynergy from left bundle branch block.  She could stop her Plavix 5-7 days preop and restart postop when safe from a surgical standpoint.  Cardiology will be available perioperatively if necessary.      Relevant Orders   EKG 12-Lead (Completed)   ECHOCARDIOGRAM COMPLETE   ST elevation myocardial infarction (STEMI) of inferoposterior wall; H/o -- Rescue PTCA of RCA -- referred for CABG (Chronic)    Small apical infarct noted on most recent Myoview.  Not unexpectedly she had mild hypokinesis in the distribution on follow-up evaluations. No active angina symptoms.  Negative Myoview for ischemia.        [place] is quite complicated.  In the absence of having a preoperative risk assessment, the finding of a new left bundle branch block with her extensive CAD history by itself is a significant amount of decision making.  With a combination of her baseline follow-up data plus preoperative risk assessment, clinical decision making is high complexity.  Comprehensive PMH review with cardiac and noncardiac history.  Chart review and time with patient greater than 45-50 minutes. >  50% was spent in direct patient contact.   Current medicines are reviewed at length with the patient today. (+/- concerns) n/a The following changes have been made: n/a  Patient Instructions  NO MEDICATION CHANGES     TEST  SCHEDULE AT [place] [facility] Your physician has requested that you have an echocardiogram. Echocardiography is a painless test that uses sound waves to create images of your heart. It provides your doctor with information about the size and shape of your heart and how well your heart's chambers  and valves are working. This procedure takes approximately one hour. There are no restrictions for this procedure.   IF ECHO IS NORMAL OR NO CHANGE   YOU WILL BE CLEARED FOR LEFT KNEE SURGERY     Your physician wants you to follow-up in 6 MONTHS WITH DR Mayan Kloepfer. - UNLESS ECHO IS ABNORMAL.You will receive a reminder letter in the mail two months in advance. If you don't receive a letter, please call our office to schedule the follow-up appointment.   If you need a refill on your cardiac medications before your next appointment, please call your pharmacy.    Studies Ordered:   Orders Placed This Encounter  Procedures  . EKG 12-Lead  . ECHOCARDIOGRAM COMPLETE      [name], M.D., M.S. Interventional Cardiologist   Pager # [phone] Phone # [phone] [address] [place], [place] [address]   Thank you for choosing [facility] at [facility]!!

## 2017-10-07 NOTE — Telephone Encounter (Signed)
Samples placed up front for pickup.  Pt aware.  Nothing further needed.

## 2017-10-07 NOTE — Patient Instructions (Signed)
NO MEDICATION CHANGES     TEST  SCHEDULE AT [address] Your physician has requested that you have an echocardiogram. Echocardiography is a painless test that uses sound waves to create images of your heart. It provides your doctor with information about the size and shape of your heart and how well your heart's chambers and valves are working. This procedure takes approximately one hour. There are no restrictions for this procedure.   IF ECHO IS NORMAL OR NO CHANGE  YOU WILL BE CLEARED FOR LEFT KNEE SURGERY     Your physician wants you to follow-up in 6 MONTHS WITH DR HARDING. - UNLESS ECHO IS ABNORMAL.You will receive a reminder letter in the mail two months in advance. If you don't receive a letter, please call our office to schedule the follow-up appointment.   If you need a refill on your cardiac medications before your next appointment, please call your pharmacy.

## 2017-10-10 ENCOUNTER — Encounter: Payer: Self-pay | Admitting: Cardiology

## 2017-10-10 NOTE — Assessment & Plan Note (Signed)
This was first noted earlier this month.  She has had some borderline IVCD in the past, but this the first time has been a true left bundle branch blocks seen.  Clearly not associated with a ACS presentation, therefore is most likely related to progression conduction delay.  Plan: Check 2D echo just to ensure there is no new structural abnormality explaining the bundle branch block.  Would also like to ensure that her EF is stable.  In the absence of anginal symptoms, I would not stress testing.

## 2017-10-10 NOTE — Assessment & Plan Note (Signed)
Blood pressure looks well controlled on beta-blocker and calcium channel blocker, little bit high today Because of significant leg pain walking in to the office.

## 2017-10-10 NOTE — Assessment & Plan Note (Signed)
Multiple stents in the SVG-diagonal branch.  She is far enough out from her last event where she would be fine holding Plavix for 5-7 days preop and restart postop.  I do want to keep her on a Thienopyridine lifelong however given the extent of stent within stent in that vein graft.

## 2017-10-10 NOTE — Assessment & Plan Note (Signed)
Her weight really depends on whether or not the lap band is tightened or not.  Unfortunately although her numbers and weight all look great with her band is tight and, she has to suffer through significant abdominal pain and nausea/vomiting.

## 2017-10-10 NOTE — Assessment & Plan Note (Signed)
No recurrent anginal symptoms since her last PCI in 2015.  Negative Myoview in 2017.   The only abnormal finding is a new left bundle branch block which is not likely to be a new ischemic issue based on those symptoms. Plan: Continue Plavix without aspirin, combination of beta-blocker and diltiazem (partly because of A. fib rate controlled), and statin.

## 2017-10-10 NOTE — Assessment & Plan Note (Signed)
There is a suggestion of possible functional bicuspid valve on last echo.  With new left bundle branch block we will go ahead and recheck an echocardiogram to ensure no evidence of aortic stenosis, but also to look for structural normalities with new left bundle branch block.

## 2017-10-10 NOTE — Assessment & Plan Note (Signed)
Again Shadana is very complicated.  The key point to note is that knee surgery is a low risk surgery from a cardiac standpoint, and she has not had any active angina or heart failure symptoms in several years now.  Her last intervention was in 2015 and she has had a negative Myoview since then. Because of her left bundle branch block, I am checking a 2D echocardiogram however this is simply to n set o a new baseline, and should not adversely affect preoperative assessment.  She does have diabetes on insulin, she has known history of ESRD, but recent renal function has been stable with her transplanted kidney, she also has a history of TIA. -->  The some of these risk factors which cannot be otherwise adjusted, She Was Still Be Intermediate to High Risk based on revised cardiac risk index scoring.  She is on a beta-blocker which does mitigate that risk.  In the absence of ongoing symptoms of angina or heart failure, I would not change my management unless her echocardiogram shows a new significant wall motion normality.  For now I would prefer to avoid Myoview stress test unless her echocardiogram shows new wall motion normality besides septal dysfunction/dyssynergy from left bundle branch block.  She could stop her Plavix 5-7 days preop and restart postop when safe from a surgical standpoint.  Cardiology will be available perioperatively if necessary.

## 2017-10-10 NOTE — Assessment & Plan Note (Signed)
No recurrence since her unstable angina presentation in 2015.  We talked again about anticoagulation, and she would prefer to stay on Plavix simply because of her need for several procedural interventions during the course of the year.  In the absence of recurrence, we are okay waiting discussed. She is on beta-blocker and calcium channel blocker for rate control. -->  If there is recurrence perioperatively, we may want to consider readdressing the concept of anticoagulation.

## 2017-10-10 NOTE — Assessment & Plan Note (Signed)
Small apical infarct noted on most recent Myoview.  Not unexpectedly she had mild hypokinesis in the distribution on follow-up evaluations. No active angina symptoms.  Negative Myoview for ischemia.

## 2017-10-10 NOTE — Assessment & Plan Note (Signed)
I have not seen recent labs, they have been followed by PCP.  She remains on Crestor.  Would like to see labs from PCP to ensure that she is within goal.

## 2017-10-12 ENCOUNTER — Ambulatory Visit (INDEPENDENT_AMBULATORY_CARE_PROVIDER_SITE_OTHER)
Admission: RE | Admit: 2017-10-12 | Discharge: 2017-10-12 | Disposition: A | Discharge status: Home / Self Care | Payer: Medicare Other | Source: Ambulatory Visit | Attending: Adult Health | Admitting: Adult Health

## 2017-10-12 ENCOUNTER — Encounter: Payer: Self-pay | Admitting: Adult Health

## 2017-10-12 ENCOUNTER — Ambulatory Visit (INDEPENDENT_AMBULATORY_CARE_PROVIDER_SITE_OTHER): Payer: Medicare Other | Admitting: Adult Health

## 2017-10-12 VITALS — BP 130/76 | HR 75 | Ht 61.0 in | Wt 232.2 lb

## 2017-10-12 DIAGNOSIS — J40 Bronchitis, not specified as acute or chronic: Secondary | ICD-10-CM | POA: Diagnosis not present

## 2017-10-12 DIAGNOSIS — J45909 Unspecified asthma, uncomplicated: Secondary | ICD-10-CM

## 2017-10-12 DIAGNOSIS — J309 Allergic rhinitis, unspecified: Secondary | ICD-10-CM

## 2017-10-12 DIAGNOSIS — Z9861 Coronary angioplasty status: Secondary | ICD-10-CM | POA: Diagnosis not present

## 2017-10-12 DIAGNOSIS — I2510 Atherosclerotic heart disease of native coronary artery without angina pectoris: Secondary | ICD-10-CM | POA: Diagnosis not present

## 2017-10-12 MED ORDER — AMOXICILLIN-POT CLAVULANATE 875-125 MG PO TABS: 1.0000 | ORAL_TABLET | Freq: Two times a day (BID) | ORAL | 0 refills | Status: DC

## 2017-10-12 MED ORDER — PREDNISONE 10 MG PO TABS: ORAL_TABLET | ORAL | 0 refills | Status: DC

## 2017-10-12 NOTE — Assessment & Plan Note (Addendum)
Asthmatic Bronchitic exacerbation and rhinitis flare  Check cxr today   Plan  Patient Instructions  Augmentin 875 mg  twice daily for 1 week, take with food Increase prednisone to 20 mg daily for 3 days then 10 mg daily for 3 days and then 5 mg daily Mucinex  DM twice daily as needed for cough and congestion Chest x-ray today Follow-up with Dr. Lamonte Sakai as planned and as needed Please contact office for sooner follow up if symptoms do not improve or worsen or seek emergency care

## 2017-10-12 NOTE — Assessment & Plan Note (Signed)
Flare   Plan  Patient Instructions  Augmentin 875 mg  twice daily for 1 week, take with food Increase prednisone to 20 mg daily for 3 days then 10 mg daily for 3 days and then 5 mg daily Mucinex  DM twice daily as needed for cough and congestion Chest x-ray today Follow-up with Dr. Lamonte Sakai as planned and as needed Please contact office for sooner follow up if symptoms do not improve or worsen or seek emergency care

## 2017-10-12 NOTE — Progress Notes (Signed)
@Patient  ID: Katherine [name], female    DOB: July 01, 1956, 62 y.o.   MRN: [id number]  Chief Complaint  Patient presents with  . Acute Visit    Cough     Referring provider: Mauricia Area, MD  HPI: 62 -year-old female former smoker with history of obstructive sleep apnea and obesity hypoventilation syndrome, status post lap band surgery, Asthma , chronic cough, upper ~by GERD and aspiration. Previous renal transplant on chronic steroids and Imuran PMH +CAD s/p CABG   10/12/2017 Acute OV : Asthma  Patient presents for an acute office visit.  Patient complains of 1 week of increased cough, congestion, sinus pressure drainage and wheezing.  She remains on Symbicort twice daily. Is using Flonase and Singulair daily. She denies any hemoptysis chest pain orthopnea PND or leg swelling.  Currently being evaluated for knee surgery for DJD of left knee.    Allergies  Allergen Reactions  . Tetracycline Hives  . Hydromorphone Hcl Nausea And Vomiting  . Niacin     Other reaction(s): Other (See Comments) Mouth blisters  . Niaspan [Niacin Er] Other (See Comments)    Mouth blisters  . Sulfa Antibiotics Nausea Only and Other (See Comments)    "Tears up stomach"  . Sulfonamide Derivatives Other (See Comments)    Reaction: per patient "tears her stomach up"  . Codeine Nausea And Vomiting  . Erythromycin Nausea And Vomiting  . Hydromorphone Hcl Nausea And Vomiting  . Morphine And Related Nausea And Vomiting  . Nalbuphine Nausea And Vomiting  . Sulfasalazine Nausea Only    Other reaction(s): Other (See Comments) Reaction: per patient "tears her stomach up" Other reaction(s): Other (See Comments) "Tears up stomach"  . Tape Rash    No "plastic" tape," please No "plastic" tape," please    Immunization History  Administered Date(s) Administered  . Influenza Split 05/11/2012, 05/17/2016, 05/17/2017  . Influenza Whole 06/17/2009, 06/17/2010, 04/18/2011  . Influenza,inj,Quad PF,6+  Mos 08/24/2013, 05/20/2015    Past Medical History:  Diagnosis Date  . Anxiety   . Bilateral carotid artery stenosis    Carotid duplex 11/979: 1-91% LICA, 47-82% RICA, >95% RECA, f/u 1 yr suggested  . CAD (coronary artery disease) of bypass graft 5/01; 3/'02, 8/'03, 10/'04; 1/15   PCI x 5 to SVG-D1   . CAD in native artery 07/1993    3 Vessel Disease (LAD-D1 & RCA) -- CABG  . CAD S/P percutaneous coronary angioplasty    PCI to SVG-D1 insertion/native D1 x 4 = '01 -(S660 BMS 2.5 x 9 - insertion into D1; '02 - distal overlap ACS Pixel 2.5 x 8  BMS; '03 distal/native ISR/Thrombosis - Pixel 2.5 x 13; '04 - ISR-  Taxus 2.5 x 20 (covered all);; 1/15 - mid SVG-D1 (50% distal ISR) - Promus P 2.75 x 20 -- 2.8 mm  . COPD mixed type ([place])    Followed by Dr. Lamonte Sakai "pulmonologist said no COPD"  . Depression with anxiety   . Diabetes mellitus type 2 in obese ([place])   . Dyslipidemia, goal LDL below 70    08/2012: TC 137, TG 200, HDL 32!, LDL 45; on statin (followed by Dr.Deterding)  . ESRD (end stage renal disease) ([place]) 1991   s/p Cadaveric Renal Transplant [facility] - Dr. Jimmy Footman)   . Family history of adverse reaction to anesthesia    mom's bp dropped during/after anesthesia  . Fibromyalgia   . GERD (gastroesophageal reflux disease)   . Glomerulonephritis, chronic, rapidly progressive 33  . H/O ST  elevation myocardial infarction (STEMI) of inferoposterior wall 07/1993   Rescue PTCA of RCA -- referred for CABG.  . H/O: GI bleed   . Hypertension associated with diabetes ([place])   . MRSA (methicillin resistant staph aureus) culture positive   . Obesity    s/p Lap Band 04/2004- Port Replacement 10/09 & 2/10 for infection; Dr. Excell Seltzer.  . OSA (obstructive sleep apnea)    no longer on CPAP or home O2  . PAD (peripheral artery disease) ([place]) 08/2013   LEA Dopplers to be read by Dr. Fletcher Anon  . PAF (paroxysmal atrial fibrillation) ([place]) 06/2014   Noted on CardioNet Monitor  - rate ~112; no recurrent  symptoms. Nonionic regulation because of frequent GI procedures. Patient prefers Plavix  . Recurrent boils    Bilateral Groin  . Rheumatoid arthritis ([place])    Per Patient Report; associated with OA  . 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 Servando Snare: LIMA-LAD, SVG-bifurcatingD1, SVG-rPDA  . S/p cadaver renal transplant [place]  . Stroke [facility]) 2012   "right eye stroke- half blind now"  . Unstable angina ([place]) 5/01; 3/'02, 8/'03, 10/'04; 1/15   x 5 occurences since Inf-Post STEMI in 1994    Tobacco History: Social History   Tobacco Use  Smoking Status Former Smoker  . Packs/day: 1.00  . Years: 30.00  . Pack years: 30.00  . Types: Cigarettes  . Last attempt to quit: 08/17/2002  . Years since quitting: 15.1  Smokeless Tobacco Never Used   Counseling given: Not Answered   Outpatient Encounter Medications as of 10/12/2017  Medication Sig  . acetaminophen (TYLENOL) 500 MG tablet Take 1 tablet (500 mg total) by mouth every 6 (six) hours as needed for moderate pain or headache.  . albuterol (VENTOLIN HFA) 108 (90 Base) MCG/ACT inhaler INHALE 2 PUFFS INTO THE LUNGS EVERY 6 (SIX) HOURS AS NEEDED FOR WHEEZING OR SHORTNESS OF BREATH.  [name] Kitchen azaTHIOprine (IMURAN) 50 MG tablet Take 125 mg by mouth every evening.   . budesonide-formoterol (SYMBICORT) 160-4.5 MCG/ACT inhaler Inhale 2 puffs into the lungs 2 (two) times daily.  . calcitRIOL (ROCALTROL) 0.25 MCG capsule Take 0.25 mcg by mouth See admin instructions. Every three days  . carbamazepine (TEGRETOL) 200 MG tablet TAKE 1 TABLET BY MOUTH TWICE A DAY  . clopidogrel (PLAVIX) 75 MG tablet Take 1 tablet (75 mg total) by mouth daily.  . clotrimazole (MYCELEX) 10 MG troche Take 1 tablet (10 mg total) by mouth 5 (five) times daily. (Patient taking differently: Take 10 mg by mouth daily as needed (thrush). )  . cyclobenzaprine (FLEXERIL) 10 MG tablet TAKE 1 TABLET BY MOUTH 3 TIMES DAILY AS NEEDED FOR MUSCLE SPASMS  . diltiazem (CARDIZEM CD) 120 MG 24 hr  capsule TAKE 1 CAPSULE (120 MG TOTAL) BY MOUTH DAILY. NEED OV.  . DULoxetine (CYMBALTA) 30 MG capsule TAKE 2 CAPSULES (60 MG TOTAL) BY MOUTH EVERY MORNING.  . famotidine (PEPCID) 20 MG tablet Take 20 mg by mouth daily.  . ferrous sulfate 325 (65 FE) MG tablet Take 1 tablet (325 mg total) by mouth 3 (three) times daily with meals. (Patient taking differently: Take 325 mg by mouth 2 (two) times daily. )  . fluticasone (FLONASE) 50 MCG/ACT nasal spray PLACE 2 SPRAYS INTO THE NOSE DAILY (Patient taking differently: Place 2 sprays into both nostrils 2 (two) times daily. )  . glimepiride (AMARYL) 4 MG tablet Take 4 mg by mouth every morning.  . insulin glargine (LANTUS) 100 UNIT/ML  injection Inject 8-12 Units into the skin See admin instructions. In the morning as needed for BGL of 160 or greater   . LORazepam (ATIVAN) 1 MG tablet Take 1 mg by mouth every morning.   . metFORMIN (GLUCOPHAGE) 500 MG tablet Take 500 mg by mouth 2 (two) times daily.  . metoCLOPramide (REGLAN) 10 MG tablet Take 1 tablet (10 mg total) by mouth every 6 (six) hours as needed for nausea (or headache).  . metoprolol tartrate (LOPRESSOR) 25 MG tablet TAKE 1/2 TABLET BY MOUTH DAILY ( TAKE A WHOLE TABLET DAILY IF SBP>150)  . montelukast (SINGULAIR) 10 MG tablet TAKE 1 TABLET (10 MG TOTAL) BY MOUTH DAILY.  . nitroGLYCERIN (NITROSTAT) 0.4 MG SL tablet Place 1 tablet (0.4 mg total) under the tongue every 5 (five) minutes as needed for chest pain.  [name] Kitchen omeprazole (PRILOSEC) 40 MG capsule Take 40 mg by mouth every morning.   [name] Kitchen oxyCODONE (OXY IR/ROXICODONE) 5 MG immediate release tablet Take 1 tablet (5 mg total) by mouth every 8 (eight) hours as needed for severe pain.  . predniSONE (DELTASONE) 5 MG tablet Take 5 mg by mouth daily with breakfast.  . promethazine (PHENERGAN) 25 MG tablet Take 25 mg by mouth at bedtime as needed for nausea or vomiting. For nausea and sleep  . rosuvastatin (CRESTOR) 20 MG tablet Take 1 tablet (20 mg total) by  mouth daily. KEEP OV.  . sertraline (ZOLOFT) 100 MG tablet Take 100 mg by mouth every morning.  . SYMBICORT 160-4.5 MCG/ACT inhaler Inhale 1 puff into the lungs 2 (two) times daily.  [name] Kitchen amoxicillin-clavulanate (AUGMENTIN) 875-125 MG tablet Take 1 tablet by mouth 2 (two) times daily for 7 days.  . predniSONE (DELTASONE) 10 MG tablet 20 mg daily for 3 days then 10 mg daily for 3 days and then 5 mg daily  . [DISCONTINUED] predniSONE (DELTASONE) 10 MG tablet Take 2 tablets daily for 5 days, then STOP (Patient not taking: Reported on 10/12/2017)   No facility-administered encounter medications on file as of 10/12/2017.      Review of Systems  Constitutional:   No  weight loss, night sweats,  Fevers, chills, fatigue, or  lassitude.  HEENT:   No headaches,  Difficulty swallowing,  Tooth/dental problems, or  Sore throat,                No sneezing, itching, ear ache,  +nasal congestion, post nasal drip,   CV:  No chest pain,  Orthopnea, PND, swelling in lower extremities, anasarca, dizziness, palpitations, syncope.   GI  No heartburn, indigestion, abdominal pain, nausea, vomiting, diarrhea, change in bowel habits, loss of appetite, bloody stools.   Resp:  No chest wall deformity  Skin: no rash or lesions.  GU: no dysuria, change in color of urine, no urgency or frequency.  No flank pain, no hematuria   MS:  No joint pain or swelling.  No decreased range of motion.  No back pain.    Physical Exam  BP 130/76 (BP Location: Right Arm, Cuff Size: Normal)   Pulse 75   Ht 5\' 1"  (1.549 m)   Wt 232 lb 3.2 oz (105.3 kg)   SpO2 97%   BMI 43.87 kg/m   GEN: A/Ox3; pleasant , NAD , obese    HEENT:  [place]/AT,  EACs-clear, TMs-wnl, NOSE-clear drainage , max tenderness , THROAT-clear, no lesions, no postnasal drip or exudate noted.   NECK:  Supple w/ fair ROM; no JVD; normal carotid impulses w/o bruits;  no thyromegaly or nodules palpated; no lymphadenopathy.    RESP  Clear  P & A; w/o, wheezes/  rales/ or rhonchi. no accessory muscle use, no dullness to percussion  CARD:  RRR, no m/r/g, no peripheral edema, pulses intact, no cyanosis or clubbing.  GI:   Obese Soft & nt; nml bowel sounds; no organomegaly or masses detected.   Musco: Warm bil, no deformities or joint swelling noted. Left knee brace   Neuro: alert, no focal deficits noted.    Skin: Warm, no lesions or rashes    Lab Results:  CBC    Component Value Date/Time   WBC 8.8 09/21/2017 0117   RBC 3.81 (L) 09/21/2017 0117   HGB 12.5 09/21/2017 0117   HCT 37.6 09/21/2017 0117   PLT 175 09/21/2017 0117   MCV 98.7 09/21/2017 0117   MCH 32.8 09/21/2017 0117   MCHC 33.2 09/21/2017 0117   RDW 14.9 09/21/2017 0117   LYMPHSABS 1.2 10/16/2016 0231   MONOABS 0.4 10/16/2016 0231   EOSABS 0.1 10/16/2016 0231   BASOSABS 0.0 10/16/2016 0231    BMET    Component Value Date/Time   NA 138 09/21/2017 0117   K 3.9 09/21/2017 0117   CL 101 09/21/2017 0117   CO2 22 09/21/2017 0117   GLUCOSE 147 (H) 09/21/2017 0117   BUN 12 09/21/2017 0117   CREATININE 0.83 09/21/2017 0117   CREATININE 0.90 06/26/2014 1108   CALCIUM 9.5 09/21/2017 0117   GFRNONAA >60 09/21/2017 0117   GFRAA >60 09/21/2017 0117    BNP No results found for: BNP  ProBNP    Component Value Date/Time   PROBNP 139.0 (H) 02/07/2016 1224    Imaging: Dg Chest 2 View  Result Date: 10/12/2017 CLINICAL DATA:  History of bronchitis, former smoking history EXAM: CHEST  2 VIEW COMPARISON:  CT chest of 06/18/2017 and chest x-ray of 12/28/2016 FINDINGS: Minimal linear scarring in the left mid lung remains. No pneumonia or effusion is seen. Mediastinal and hilar contours are unremarkable. Mild cardiomegaly is stable. Median sternotomy sutures are present from prior CABG. Coronary artery stents are noted. No acute bony abnormality is seen. IMPRESSION: Stable mild cardiomegaly.  No active lung disease. Electronically Signed   By: Ivar Drape M.D.   On: 10/12/2017  12:57     Assessment & Plan:   Extrinsic asthma Asthmatic Bronchitic exacerbation and rhinitis flare  Check cxr today   Plan  Patient Instructions  Augmentin 875 mg  twice daily for 1 week, take with food Increase prednisone to 20 mg daily for 3 days then 10 mg daily for 3 days and then 5 mg daily Mucinex  DM twice daily as needed for cough and congestion Chest x-ray today Follow-up with Dr. Lamonte Sakai as planned and as needed Please contact office for sooner follow up if symptoms do not improve or worsen or seek emergency care      Chronic allergic rhinitis Flare   Plan  Patient Instructions  Augmentin 875 mg  twice daily for 1 week, take with food Increase prednisone to 20 mg daily for 3 days then 10 mg daily for 3 days and then 5 mg daily Mucinex  DM twice daily as needed for cough and congestion Chest x-ray today Follow-up with Dr. Lamonte Sakai as planned and as needed Please contact office for sooner follow up if symptoms do not improve or worsen or seek emergency care          Rexene Edison, NP 10/12/2017

## 2017-10-12 NOTE — Progress Notes (Signed)
62 y/o with PMH of Asthma, COPD, Lung nodules, former smoker quit in 2004, and CABG.    Pt does have chronic cough, but states she has not been feeling well over the last six days. Notes increased SOB at rest and with activity, nonproductive cough, frontal and maxillary sinus tenderness, for six days.  Endorses wheezing and chills night time in the past six days.  Pt is on chronic prednisone at 5 mg per night due to a hx of kidney transplant.  States she has been taking her Symbicort as prescribed.  Has been using her albuterol once per day for the last six days.   Pt also requesting pulmonary clearance for future knee surgery    PE Gen: Pleasant, obese, in no distress  ENT post pharynx erythema, no purulence, erythema noted to nasal mucosa, mild erythema to external rt ear canal  Sinuses: tenderness noted with palpation to frontal and maxillary sinuses  Neck: No JVD  Lungs: No use of accessory muscles, no dullness to percussion, clear without rales or rhonchi  Cardiovascular: RRR, heart sounds normal, no murmur herd, no peripheral edema Neuro: alert and oriented x3 Skin: Warm, no lesions or rashes  I/p  Acute Asthma bronchitis Augmentin 875/125 mg twice daily for one week, take with food Finish full course of antibiotic Increase prednisone to 20 mg daily for 3 days, then 10 mg for 3 days and then 5 mg daily Mucinex DM twice daily as needed for cough and congestion Hydration encouraged Chest x-ray today Folllow-up with Dr. Lamonte Sakai as planned and as needed Will discuss knee surgery clearance at follow up appointment with Dr. Lamonte Sakai  Please contact office for sooner follow up if symptoms do not improve or worsen, or seek emergency care  Acute sinusitis unspecifiedAugmentin 875/125 mg twice daily for one week, take with food Finish full course of antibioticMucinex DM twice daily as needed for cough and congestion Hydration encouraged

## 2017-10-12 NOTE — Patient Instructions (Signed)
Augmentin 875 mg  twice daily for 1 week, take with food Increase prednisone to 20 mg daily for 3 days then 10 mg daily for 3 days and then 5 mg daily Mucinex  DM twice daily as needed for cough and congestion Chest x-ray today Follow-up with Dr. Lamonte Sakai as planned and as needed Please contact office for sooner follow up if symptoms do not improve or worsen or seek emergency care

## 2017-10-13 ENCOUNTER — Other Ambulatory Visit: Payer: Self-pay | Admitting: Physical Medicine & Rehabilitation

## 2017-10-13 DIAGNOSIS — M47816 Spondylosis without myelopathy or radiculopathy, lumbar region: Secondary | ICD-10-CM

## 2017-10-13 DIAGNOSIS — M797 Fibromyalgia: Secondary | ICD-10-CM

## 2017-10-15 ENCOUNTER — Telehealth: Payer: Self-pay | Admitting: Physical Medicine & Rehabilitation

## 2017-10-15 NOTE — Telephone Encounter (Signed)
Pt would like a copy of her MRI that was done on her back & left knee in Feb 2019.  If Dr Naaman Plummer is able to print & leave it up front for her to pick up, please call pt.  Thanks, Vilinda Blanks.

## 2017-10-18 ENCOUNTER — Other Ambulatory Visit: Payer: Self-pay | Admitting: Physical Medicine & Rehabilitation

## 2017-10-18 DIAGNOSIS — M47816 Spondylosis without myelopathy or radiculopathy, lumbar region: Secondary | ICD-10-CM

## 2017-10-19 ENCOUNTER — Ambulatory Visit (INDEPENDENT_AMBULATORY_CARE_PROVIDER_SITE_OTHER): Payer: Medicare Other | Admitting: Emergency Medicine

## 2017-10-19 ENCOUNTER — Encounter: Payer: Self-pay | Admitting: *Deleted

## 2017-10-19 ENCOUNTER — Encounter: Payer: Self-pay | Admitting: Emergency Medicine

## 2017-10-19 VITALS — BP 122/80 | HR 67 | Ht 61.0 in | Wt 235.0 lb

## 2017-10-19 DIAGNOSIS — Z9861 Coronary angioplasty status: Secondary | ICD-10-CM | POA: Diagnosis not present

## 2017-10-19 DIAGNOSIS — J309 Allergic rhinitis, unspecified: Secondary | ICD-10-CM

## 2017-10-19 DIAGNOSIS — K219 Gastro-esophageal reflux disease without esophagitis: Secondary | ICD-10-CM | POA: Diagnosis not present

## 2017-10-19 DIAGNOSIS — I2510 Atherosclerotic heart disease of native coronary artery without angina pectoris: Secondary | ICD-10-CM | POA: Diagnosis not present

## 2017-10-19 DIAGNOSIS — J45909 Unspecified asthma, uncomplicated: Secondary | ICD-10-CM

## 2017-10-19 NOTE — Assessment & Plan Note (Signed)
Continue omeprazole and Pepcid as ordered.  I suspect that she is having breakthrough symptoms based on her nocturnal cough.  Likely influenced by her lap band

## 2017-10-19 NOTE — Patient Instructions (Addendum)
We will perform spirometry today to compare with prior pulmonary function testing. Please continue your medications as you have been taking them  You are at mild to moderately increased risk for your knee surgery and general anesthesia due to your weight, chronic immunosuppression and to your mild obstructive lung disease.  This does not preclude you from having a surgery if the benefits outweigh these risks. Follow with Dr Lamonte Sakai in 4 months or sooner if you have any problems.

## 2017-10-19 NOTE — Assessment & Plan Note (Signed)
Mild obstruction based on her prior pulmonary function testing.  We will repeat spirometry today in preparation for possible knee surgery.  Assuming no significant decrease in function she should be mild to moderate increased risk due to her several medical problems.  There is nothing to preclude her from having the surgery.  She is improved from her recent exacerbation of cough.

## 2017-10-19 NOTE — Progress Notes (Signed)
62 year old female patient has a known history of obesity, status post lap band surgery with significant weight loss. Patient also has a history of renal transplant on chronic immunosuppression with Imuran and prednisone. The patient has a history of sleep apnea now much improved after significant weight loss.  Still needs repeat PSG to prove that OSA has resolved. She has stopped using CPAP.   Acute OV 06/18/17 --patient is 62 years old, has a history of lap band surgery with associated GERD, former obstructive sleep apnea no longer on CPAP, upper airway irritation syndrome with associated cough and probably also some true obstructive lung disease/asthma.  She had pulmonary nodules noted on CT scan from 10/14/16, apparent resolution by chest x-ray 12/28/16.  Suspected to be infectious in nature, some question of possible chronic aspiration. She just had a benign mass removed surgically, B salpingo-oophorectomy done 9/28. She feels that her chest has been more tight since the procedure. I gave her prednisone last week, seems to be helping some but not at baseline. She has gained wt since saline removed from her lap band, has gained. She is hearing wheeze at night.  She is on symbicort, using albuterol about 2x a day currently.   ROV 10/19/17 --62 year old woman with a history of chronic cough in the setting of GERD, former lap band surgery, allergic rhinitis.  Also with some suspected mild obstruction based on clinical symptoms and curve of her flow volume loop from pulmonary function testing back in 07/17/16.  She has a history of a renal transplant on chronic immunosuppression with Imuran, prednisone 5 mg daily.  She has gained weight since her lap band was released.  She has left knee pain and is preparing to undergo a repair of a torn meniscus.  Currently managed on Symbicort, Singulair, omeprazole, Pepcid, fluticasone nasal spray twice daily.  Her cough is improved since she was treated for an acute flair  10/12/17 with Augmentin and prednisone.   PE :  Vitals:   10/19/17 1203  BP: 122/80  Pulse: 67  SpO2: 97%  Weight: 235 lb (106.6 kg)  Height: 5\' 1"  (1.549 m)   Gen: Pleasant, obese, has gained wt, in no distress,  normal affect  ENT post pharynx erythema, no purulence  Neck: No JVD, no stridor  Lungs: No use of accessory muscles, distant but no wheeze  Cardiovascular: RRR, heart sounds normal, no murmur or gallops, no peripheral edema  Musculoskeletal: No deformities, no cyanosis or clubbing  Neuro: alert, non focal  Skin: Warm, no lesions or rashes  Extrinsic asthma Mild obstruction based on her prior pulmonary function testing.  We will repeat spirometry today in preparation for possible knee surgery.  Assuming no significant decrease in function she should be mild to moderate increased risk due to her several medical problems.  There is nothing to preclude her from having the surgery.  She is improved from her recent exacerbation of cough.  Chronic allergic rhinitis Please continue Singulair, fluticasone nasal spray  GERD Continue omeprazole and Pepcid as ordered.  I suspect that she is having breakthrough symptoms based on her nocturnal cough.  Likely influenced by her lap band   Baltazar Apo, MD, PhD 10/19/2017, 12:27 PM [facility] [phone] or if no answer [phone]

## 2017-10-19 NOTE — Assessment & Plan Note (Signed)
Please continue Singulair, fluticasone nasal spray

## 2017-10-21 ENCOUNTER — Ambulatory Visit (HOSPITAL_COMMUNITY): Payer: Medicare Other

## 2017-10-23 ENCOUNTER — Other Ambulatory Visit: Payer: Self-pay | Admitting: Cardiology

## 2017-10-25 NOTE — Telephone Encounter (Signed)
REFILL 

## 2017-10-27 ENCOUNTER — Ambulatory Visit (HOSPITAL_COMMUNITY): Payer: Medicare Other | Attending: Cardiology

## 2017-10-27 ENCOUNTER — Other Ambulatory Visit: Payer: Self-pay

## 2017-10-27 DIAGNOSIS — E119 Type 2 diabetes mellitus without complications: Secondary | ICD-10-CM | POA: Insufficient documentation

## 2017-10-27 DIAGNOSIS — I350 Nonrheumatic aortic (valve) stenosis: Secondary | ICD-10-CM | POA: Insufficient documentation

## 2017-10-27 DIAGNOSIS — I10 Essential (primary) hypertension: Secondary | ICD-10-CM | POA: Insufficient documentation

## 2017-10-27 DIAGNOSIS — E785 Hyperlipidemia, unspecified: Secondary | ICD-10-CM | POA: Insufficient documentation

## 2017-10-27 DIAGNOSIS — Z01810 Encounter for preprocedural cardiovascular examination: Secondary | ICD-10-CM

## 2017-10-27 DIAGNOSIS — I2510 Atherosclerotic heart disease of native coronary artery without angina pectoris: Secondary | ICD-10-CM

## 2017-10-27 DIAGNOSIS — J449 Chronic obstructive pulmonary disease, unspecified: Secondary | ICD-10-CM | POA: Diagnosis not present

## 2017-10-27 DIAGNOSIS — I480 Paroxysmal atrial fibrillation: Secondary | ICD-10-CM | POA: Diagnosis not present

## 2017-10-27 DIAGNOSIS — I358 Other nonrheumatic aortic valve disorders: Secondary | ICD-10-CM

## 2017-10-27 DIAGNOSIS — I447 Left bundle-branch block, unspecified: Secondary | ICD-10-CM | POA: Diagnosis not present

## 2017-10-27 DIAGNOSIS — Z9861 Coronary angioplasty status: Secondary | ICD-10-CM | POA: Diagnosis not present

## 2017-10-28 IMAGING — CT CT CHEST W/O CM
2 of 3 series · 15 of 36 positions shown, 18 images · non-contrast
Comparison: Chest radiograph from 10/13/2016

CLINICAL DATA: Assess pulmonary nodules noted on chest radiograph.
Initial encounter.

EXAM:
CT CHEST WITHOUT CONTRAST
TECHNIQUE: Multidetector CT imaging of the chest was performed following the
standard protocol without IV contrast.

[Series 2: chest w/o 2mm st · axial · non-contrast · 0.87mm/px · z∈[+1007,+1253]mm · 12 of 145 slices shown, 15 images]
[im 11/145  mediastinal]
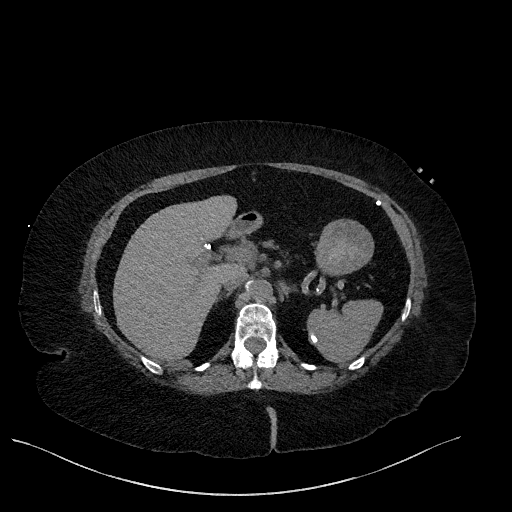
[im 11/145  lung]
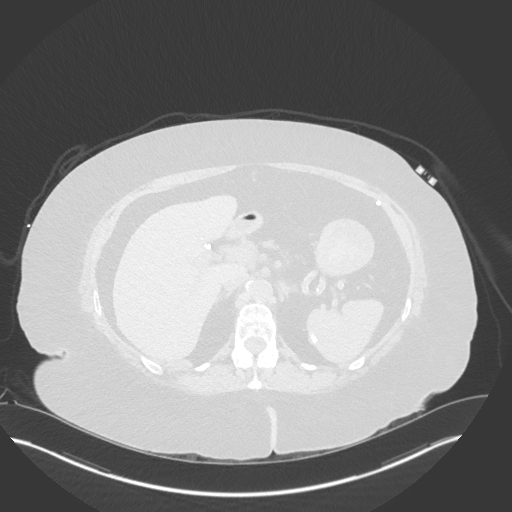
[im 22/145  lung]
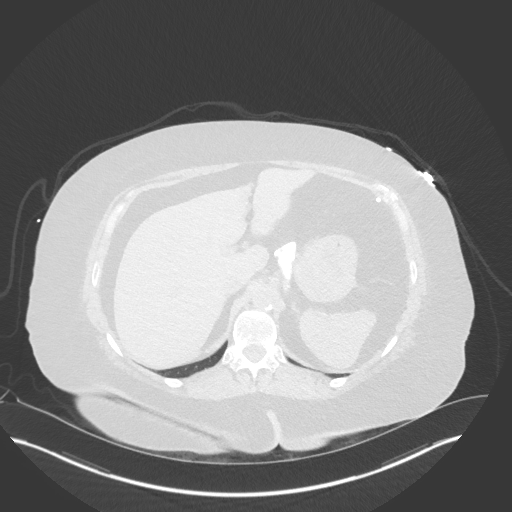
[im 33/145  lung]
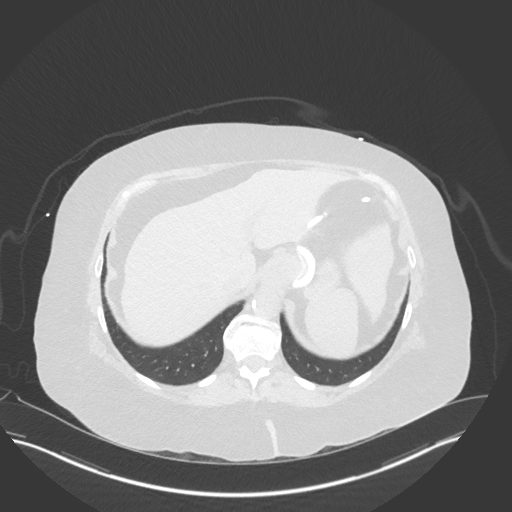
[im 43/145  lung]
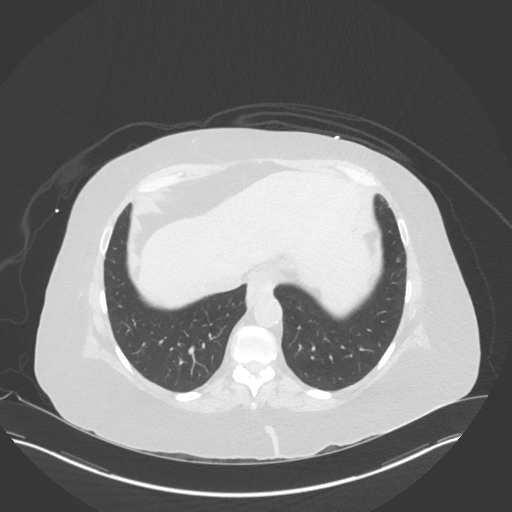
[im 54/145  mediastinal]
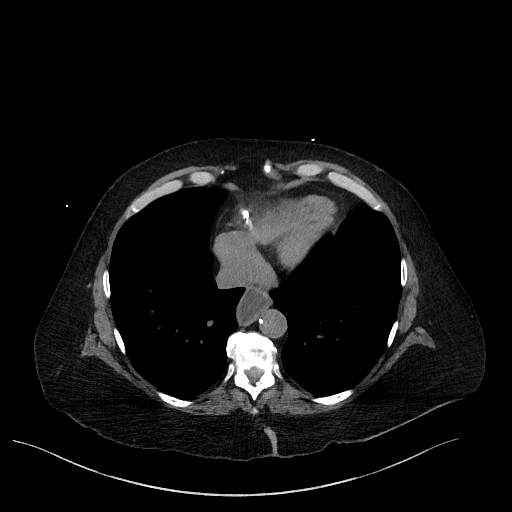
[im 54/145  lung]
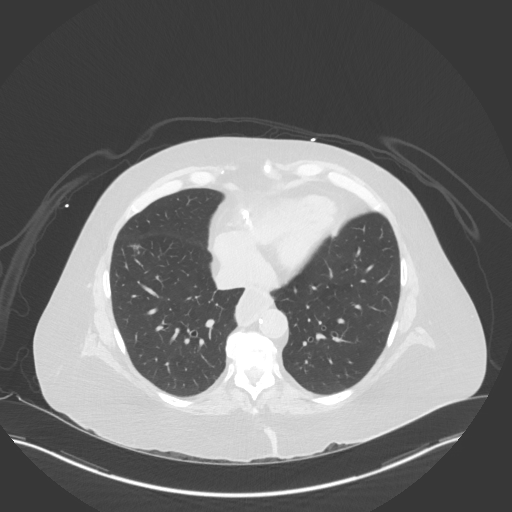
[im 65/145  lung]
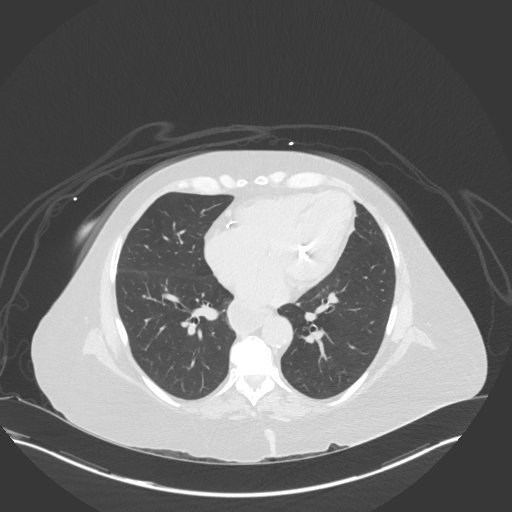
[im 81/145  lung]
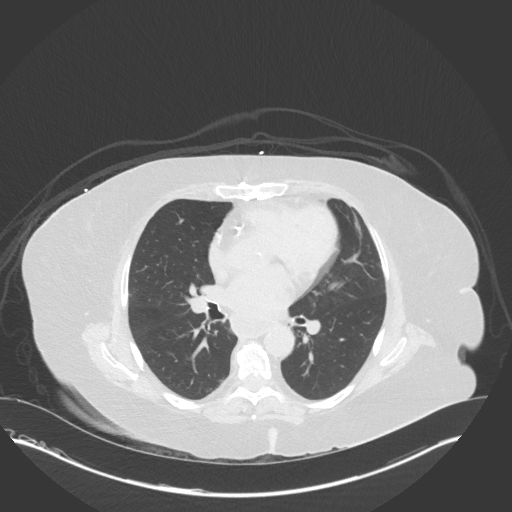
[im 91/145  lung]
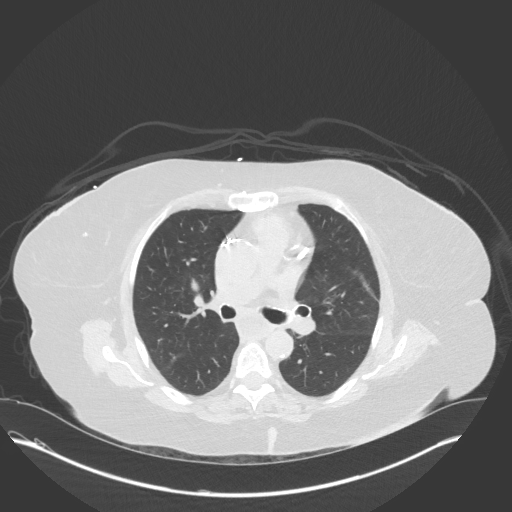
[im 102/145  mediastinal]
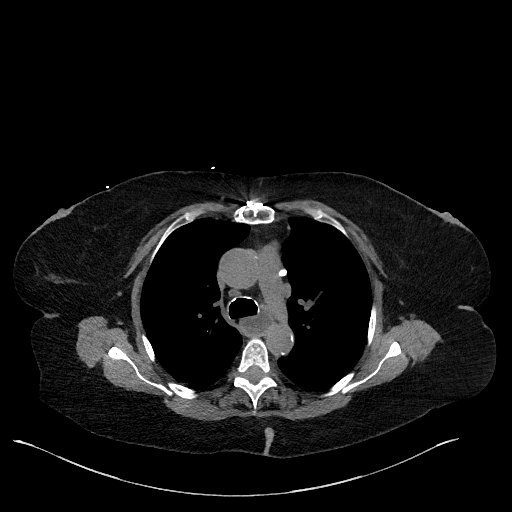
[im 102/145  lung]
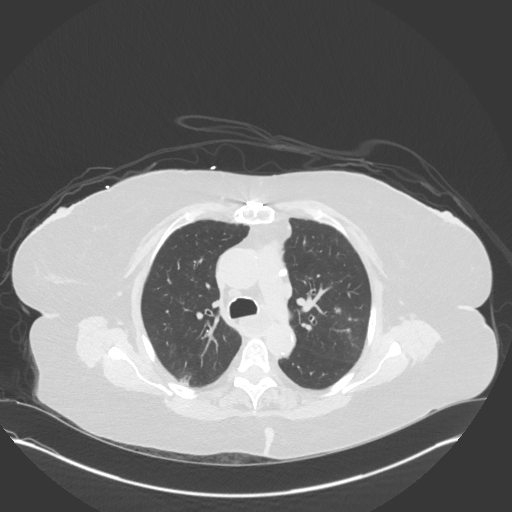
[im 113/145  lung]
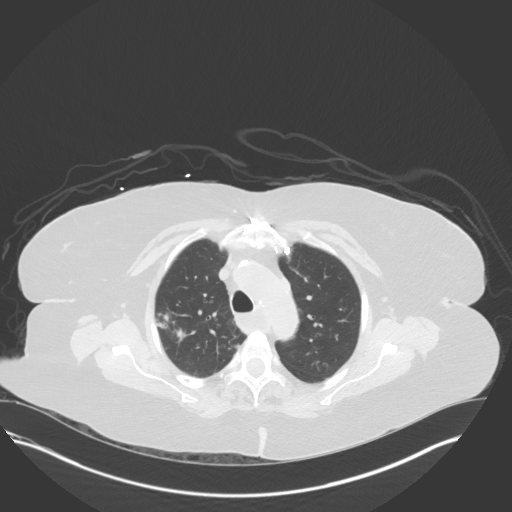
[im 123/145  lung]
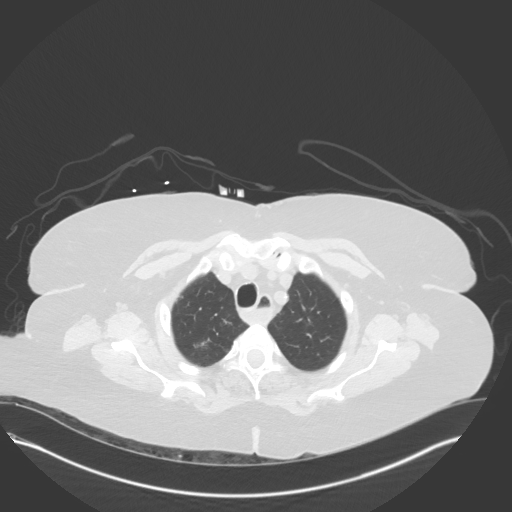
[im 134/145  lung]
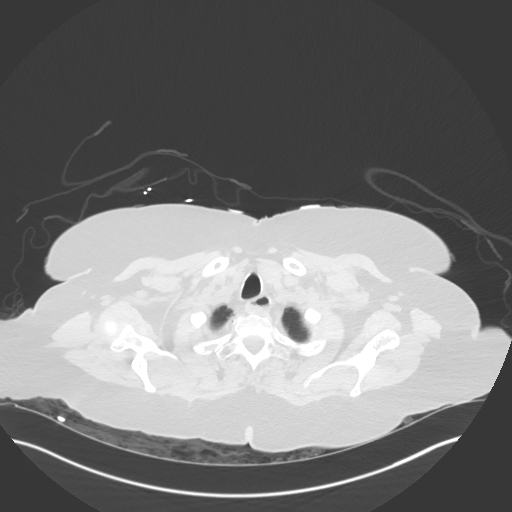

[Series 4: chest w/o 3mm st cor · coronal · non-contrast · 0.56mm/px · 3 of 79 slices shown]
[im 16/79  lung]
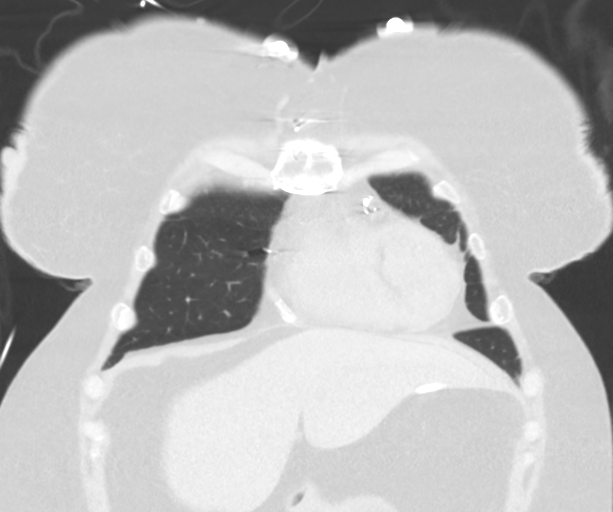
[im 32/79  lung]
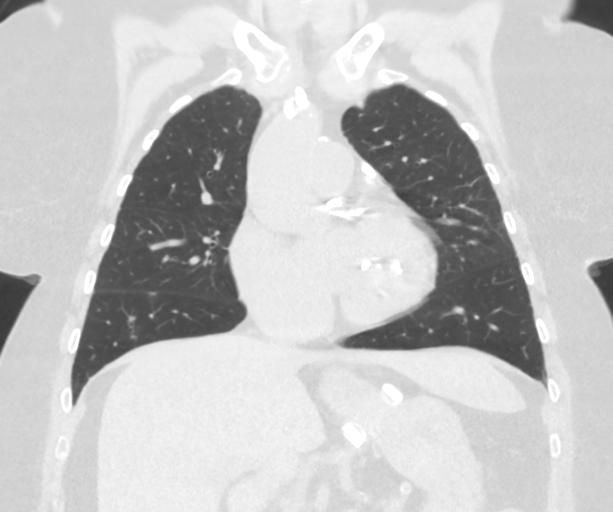
[im 47/79  lung]
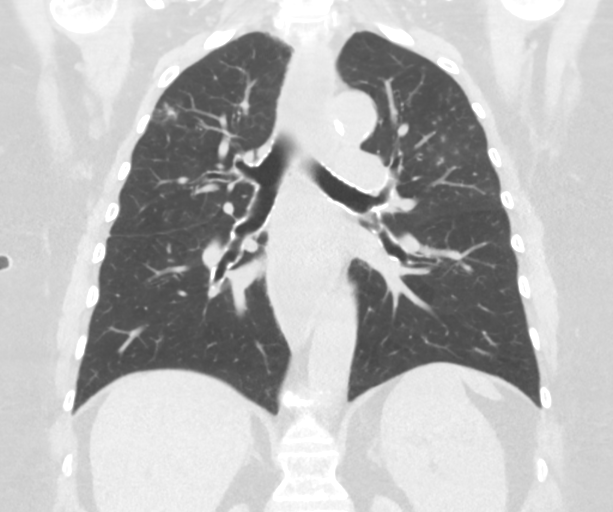

[15 of 36 positions shown; findings below may reference images not displayed]

FINDINGS: Cardiovascular: The patient is status post median sternotomy.
Diffuse coronary artery calcifications are seen. The heart remains
normal in size. Scattered calcification is noted along the aortic
arch and descending thoracic aorta. Mild calcification is seen at
the proximal great vessels.

Mediastinum/Nodes: The esophagus is filled with fluid, raising
question for esophageal dysmotility or gastroesophageal reflux.
Scattered calcifications and heterogeneity are noted at the thyroid
gland. No dominant mass is seen. No mediastinal lymphadenopathy is
seen. No pericardial effusion is identified. No axillary
lymphadenopathy is identified.

Lungs/Pleura: Multiple scattered nodules and associated tiny
opacities are noted throughout both lungs, more prominent on the
right side. Given that these are new from [place], these are most
likely infectious in nature. No pleural effusion or pneumothorax is
seen. No dominant mass is identified.

Upper Abdomen: The mildly nodular contour of the liver raises
question for mild hepatic cirrhosis. Peripheral calcification is
seen at the spleen, possibly reflecting remote injury. The patient
is status post cholecystectomy, with clips noted at the gallbladder
fossa. The visualized portions of the pancreas and adrenal glands
are within normal limits. There is chronic severe bilateral renal
atrophy. A gastric band is noted.

Musculoskeletal: No acute osseous abnormalities are identified. Mild
multilevel vacuum phenomenon is noted along the thoracic spine. The
visualized musculature is unremarkable in appearance.
IMPRESSION: 1. Scattered nodules and associated tiny opacities within both
lungs, more prominent on the right. Given that these are new from
[place], they are most likely infectious in nature. Would suggest
Pulmonary consultation for further evaluation, to assess the
underlying infectious etiology.
2. Esophagus filled with fluid, raising question for esophageal
dysmotility or gastroesophageal reflux.
3. Diffuse coronary artery calcifications seen.
4. Suggestion of mild hepatic cirrhosis.
5. Chronic severe bilateral renal atrophy.

## 2017-10-29 DIAGNOSIS — J040 Acute laryngitis: Secondary | ICD-10-CM | POA: Diagnosis not present

## 2017-10-29 DIAGNOSIS — J40 Bronchitis, not specified as acute or chronic: Secondary | ICD-10-CM | POA: Diagnosis not present

## 2017-10-29 DIAGNOSIS — J322 Chronic ethmoidal sinusitis: Secondary | ICD-10-CM | POA: Diagnosis not present

## 2017-10-29 DIAGNOSIS — J320 Chronic maxillary sinusitis: Secondary | ICD-10-CM | POA: Diagnosis not present

## 2017-10-29 DIAGNOSIS — B370 Candidal stomatitis: Secondary | ICD-10-CM | POA: Diagnosis not present

## 2017-11-01 ENCOUNTER — Telehealth: Payer: Self-pay | Admitting: Cardiology

## 2017-11-01 NOTE — Telephone Encounter (Signed)
Notes recorded by [name], MD on 10/27/2017 at 4:30 PM EDT Echo result: Moderate left ventricular hypertrophy with grade 2 diastolic dysfunction goes along with high blood pressure. Normal pump function of 55-60%. Normal wall motion. The aortic valve is calcified with mild stenosis with roughly the same gradients as last time. There is no comment here on bicuspid valve. Likely calcification of the commissures. Probably do not need to follow-up every year may be every 2 years.   [name], MD

## 2017-11-01 NOTE — Telephone Encounter (Signed)
New Message:   Pt is calling for results of her Echo in order to get her surgical clearance for her knee surgery. Pt states she can not schedule appt until she gets this clearance.

## 2017-11-01 NOTE — Telephone Encounter (Signed)
Patient aware of results.   She needs clearance for left knee clean out (torn meniscus, etc) sent to Dr. Flossie Dibble - not yet scheduled. Patient thinks this surgery will be done at the hospital and not outpatient surgery center.   Since MD is out of the office this week, routing message to pre-op pool

## 2017-11-02 NOTE — Telephone Encounter (Signed)
   Primary Cardiologist: [name], MD  Chart reviewed as part of pre-operative protocol coverage.   She was seen by Dr. Ellyn Hack on 10/07/2017.  At that time, no further cardiac workup was indicated as long as her echocardiogram did not show a new wall motion abnormality.  It does not.  Therefore, based on Dr. Allison Quarry assessment, Katherine [name] would be at acceptable risk for the planned procedure without further cardiovascular testing.   I will route this recommendation to the requesting party via Epic fax function and remove from pre-op pool.  Please call with questions.  Rosaria Ferries, PA-C 11/02/2017, 5:15 PM

## 2017-11-03 ENCOUNTER — Telehealth: Payer: Self-pay | Admitting: Cardiology

## 2017-11-03 IMAGING — DX DG CHEST 2V
2 series · 2 of 2 positions shown · non-contrast
Comparison: Radiographs October 13, 2016. CT scan October 14, 2016.

CLINICAL DATA: Cough, pneumonia.

EXAM:
CHEST  2 VIEW

[chest pa]
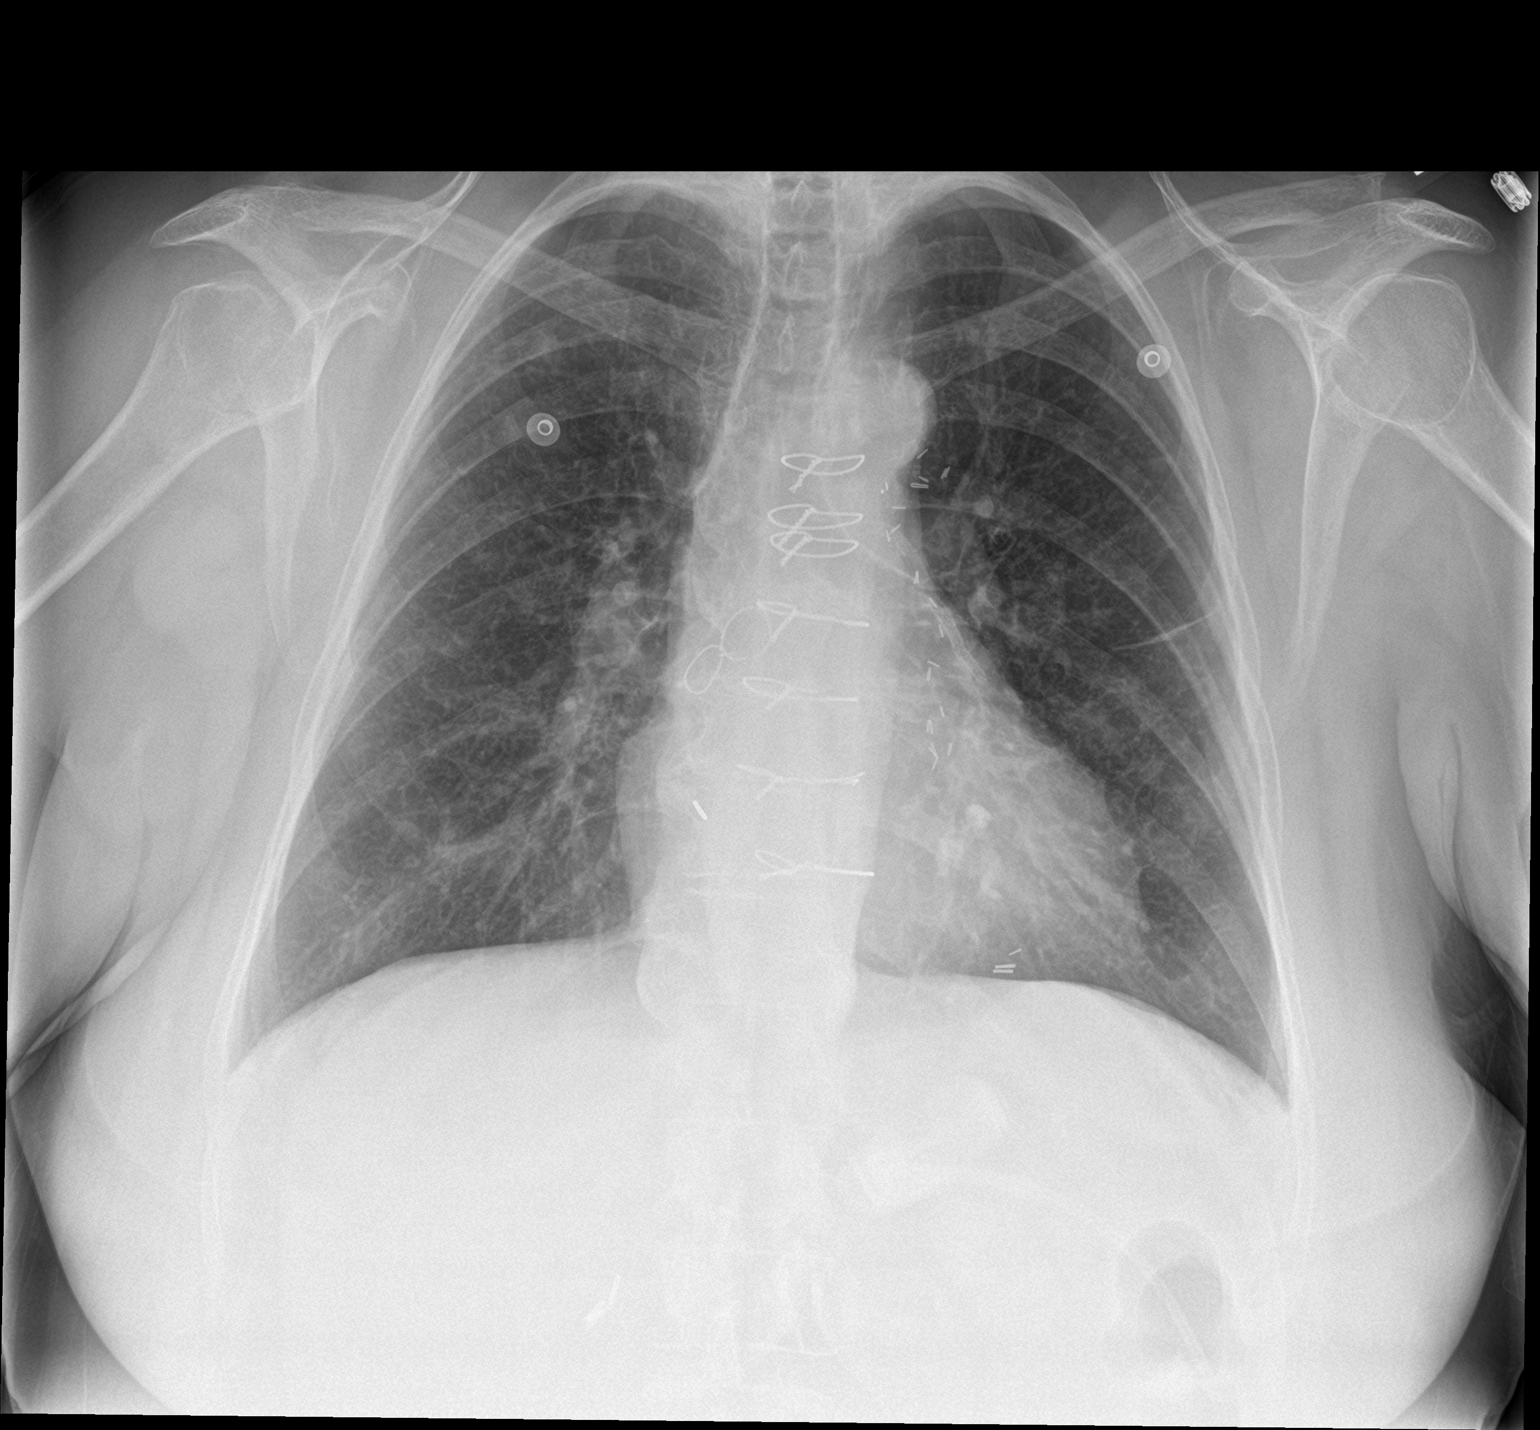

[chest lat]
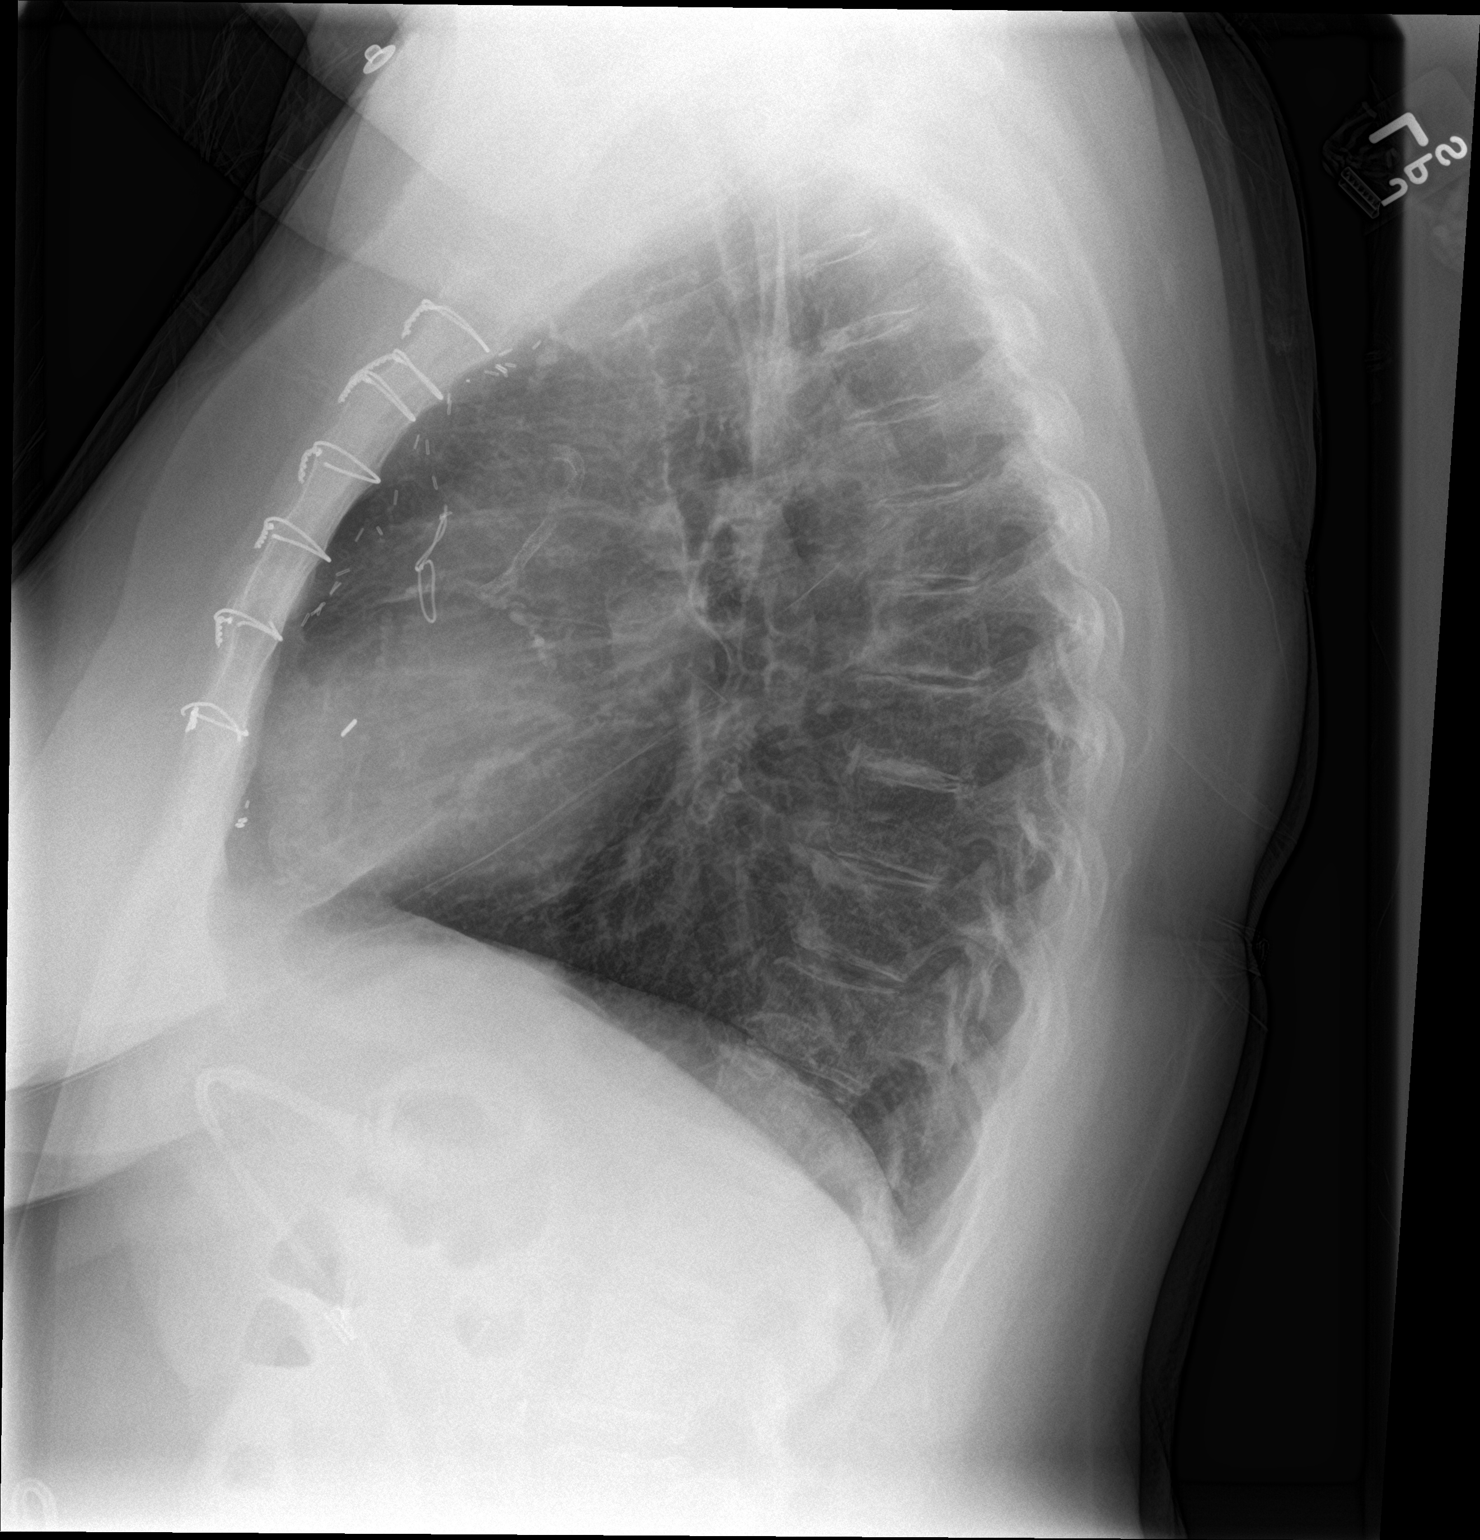

[2 of 2 positions shown; findings below may reference images not displayed]

FINDINGS: The heart size and mediastinal contours are within normal limits.
Status post coronary artery bypass graft. No pneumothorax or pleural
effusion is noted. Stable nodular densities are noted in the right
upper lobe as described on prior exam. Left lung appears essentially
clear at this time. The visualized skeletal structures are
unremarkable.
IMPRESSION: Stable right upper lobe nodular densities as noted on prior exam
which may be inflammatory or infectious in etiology. Continued CT or
radiographic follow-up is recommended to ensure resolution.

## 2017-11-03 NOTE — Telephone Encounter (Signed)
   Pt has been cleared by Dr. Ellyn Hack or knee surgery. Per Dr. Allison Quarry office note, "She could stop her Plavix 5-7 days preop and restart postop when safe from a surgical standpoint. Cardiology will be available perioperatively if necessary".  I will fax to Dr. Libby Maw and will remove from preop pool.

## 2017-11-03 NOTE — Telephone Encounter (Signed)
New Message      [facility] Pre-operative Risk Assessment    Request for surgical clearance:  1. What type of surgery is being performed? Left meniscus   2. When is this surgery scheduled? TBD 3. What type of clearance is required (medical clearance vs. Pharmacy clearance to hold med vs. Both)? Pharmacy/ medical has already been handled   4. Are there any medications that need to be held prior to surgery and how long? How soon prior to surgery does she need to stop it   5. Practice name and name of physician performing surgery?  Dr Libby Maw   6. What is your office phone and fax number?  [phone] fax [phone]  7. Anesthesia type (None, local, MAC, general) ? General    Howie Ill 11/03/2017, 11:54 AM  _________________________________________________________________   (provider comments below)

## 2017-11-03 NOTE — Telephone Encounter (Signed)
Spoke to patient , informed her clearance for surgery. Re-routed clearance to Dr CAFFREY'S OFFICE

## 2017-11-04 DIAGNOSIS — J320 Chronic maxillary sinusitis: Secondary | ICD-10-CM | POA: Diagnosis not present

## 2017-11-04 DIAGNOSIS — J322 Chronic ethmoidal sinusitis: Secondary | ICD-10-CM | POA: Diagnosis not present

## 2017-11-04 DIAGNOSIS — J040 Acute laryngitis: Secondary | ICD-10-CM | POA: Diagnosis not present

## 2017-11-09 ENCOUNTER — Ambulatory Visit: Payer: Self-pay | Admitting: Physician Assistant

## 2017-11-09 NOTE — H&P (Signed)
Katherine [name] is an 62 y.o. female.   Chief Complaint: left knee pain HPI: History of well-functioning kidney transplant.  She has an MRI of the left knee that shows a torn lateral meniscus and relatively significant degenerative change.  However, I don't see, even on flexed weight bearing view today anything close to bone-on-bone.  She is having giving way.  Severely degenerated torn lateral meniscus; again, lateral compartment changes.  Past Medical History:  Diagnosis Date  . Anxiety   . Bilateral carotid artery stenosis    Carotid duplex 0/4540: 9-81% LICA, 19-14% RICA, >78% RECA, f/u 1 yr suggested  . CAD (coronary artery disease) of bypass graft 5/01; 3/'02, 8/'03, 10/'04; 1/15   PCI x 5 to SVG-D1   . CAD in native artery 07/1993    3 Vessel Disease (LAD-D1 & RCA) -- CABG  . CAD S/P percutaneous coronary angioplasty    PCI to SVG-D1 insertion/native D1 x 4 = '01 -(S660 BMS 2.5 x 9 - insertion into D1; '02 - distal overlap ACS Pixel 2.5 x 8  BMS; '03 distal/native ISR/Thrombosis - Pixel 2.5 x 13; '04 - ISR-  Taxus 2.5 x 20 (covered all);; 1/15 - mid SVG-D1 (50% distal ISR) - Promus P 2.75 x 20 -- 2.8 mm  . COPD mixed type ([place])    Followed by Dr. Lamonte Sakai "pulmonologist said no COPD"  . Depression with anxiety   . Diabetes mellitus type 2 in obese ([place])   . Dyslipidemia, goal LDL below 70    08/2012: TC 137, TG 200, HDL 32!, LDL 45; on statin (followed by Dr.Deterding)  . ESRD (end stage renal disease) ([place]) 1991   s/p Cadaveric Renal Transplant [facility] - Dr. Jimmy Footman)   . Family history of adverse reaction to anesthesia    mom's bp dropped during/after anesthesia  . Fibromyalgia   . GERD (gastroesophageal reflux disease)   . Glomerulonephritis, chronic, rapidly progressive 21  . 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 07/1993   Rescue PTCA of RCA -- referred for CABG.  . H/O: GI bleed   . Hypertension associated with diabetes ([place])   . MRSA  (methicillin resistant staph aureus) culture positive   . Obesity    s/p Lap Band 04/2004- Port Replacement 10/09 & 2/10 for infection; Dr. Excell Seltzer.  . OSA (obstructive sleep apnea)    no longer on CPAP or home O2  . PAD (peripheral artery disease) ([place]) 08/2013   LEA Dopplers to be read by Dr. Fletcher Anon  . PAF (paroxysmal atrial fibrillation) ([place]) 06/2014   Noted on CardioNet Monitor  - rate ~112; no recurrent symptoms. Nonionic regulation because of frequent GI procedures. Patient prefers Plavix  . Recurrent boils    Bilateral Groin  . Rheumatoid arthritis ([place])    Per Patient Report; associated with OA  . 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 Servando Snare: LIMA-LAD, SVG-bifurcatingD1, SVG-rPDA  . S/p cadaver renal transplant [place]  . Stroke [facility]) 2012   "right eye stroke- half blind now"  . Unstable angina ([place]) 5/01; 3/'02, 8/'03, 10/'04; 1/15   x 5 occurences since Inf-Post STEMI in 1994    Past Surgical History:  Procedure Laterality Date  . CARDIAC CATHETERIZATION  5/'01, 3/'02, 8/'03, 10/'04; 1/'15   08/22/2013: LAD & RCA 100%; LIMA-LAD & SVG-rPDA patent; Cx-- OM1 60%, OM2 ostial ~50%; SVG-D1 - 80% mid, 50% distal ISR --PCI  . CATHETER REMOVAL    . CHOLECYSTECTOMY N/A 10/29/2014  Procedure: LAPAROSCOPIC CHOLECYSTECTOMY WITH INTRAOPERATIVE CHOLANGIOGRAM;  Surgeon: Excell Seltzer, MD;  Location: [facility] ORS;  Service: General;  Laterality: N/A;  . CORONARY ANGIOPLASTY  1994   x5  . CORONARY ARTERY BYPASS GRAFT  1995   LIMA-LAD, SVG-RPDA, SVG-D1  . ESOPHAGOGASTRODUODENOSCOPY N/A 10/15/2016   Procedure: ESOPHAGOGASTRODUODENOSCOPY (EGD);  Surgeon: Wilford Corner, MD;  Location: [facility] ENDOSCOPY;  Service: Endoscopy;  Laterality: N/A;  . INCISE AND DRAIN ABCESS    . KIDNEY TRANSPLANT  1991  . LAPAROSCOPIC GASTRIC BANDING  04/2004; 10/'09, 2/'10   Port Replacement x 2  . LEFT HEART CATHETERIZATION WITH CORONARY/GRAFT ANGIOGRAM N/A 08/23/2013   Procedure: LEFT HEART CATHETERIZATION WITH  Beatrix Fetters;  Surgeon: Wellington Hampshire, MD;  Location: [facility] CATH LAB;  Service: Cardiovascular;  Laterality: N/A;  . Lower Extremity Arterial Dopplers  08/2013   ABI: R 0.96, L 1.04  . MULTIPLE TOOTH EXTRACTIONS  age 35  . NM MYOVIEW LTD  03/2016   EF 62%. LOW RISK. C/W prior MI - no Ischemia. Apical hypokinesis.  [name] Kitchen PERCUTANEOUS CORONARY STENT INTERVENTION (PCI-S)  5/'01, 3/'02, 8/'03, 10/'04;   '01 - S660 BMS 2.5 x 9 - dSVG-D1 into D1; '02- post-stent stenosis - 2.5 x 8 Pixel BMS; '8\03: ISR/Thrombosis into native D1 - AngioJet, 2.5 x 13 Pixel; '04 - ISR 95% - covered stented area with Taxus DES 2.5 mm x 20 (2.88)  . PERCUTANEOUS CORONARY STENT INTERVENTION (PCI-S)  08/23/2013   Procedure: PERCUTANEOUS CORONARY STENT INTERVENTION (PCI-S);  Surgeon: Wellington Hampshire, MD;  Location: [facility] CATH LAB;  Service: Cardiovascular;;mid SVG-D1 80%; distal stent ~50% ISR; Promus [place] DES 2.75 mm xc 20 mm (2.8 mm)  . PORT-A-CATH REMOVAL     kidney  . TRANSTHORACIC ECHOCARDIOGRAM  02/2016   EF 55-60%. Septal dyssynergy from CABG GR 2 DD. Aortic valve is trileaflet the functional bicuspid with sclerosis but not yet notable for stenosis stenosis.; no Significant change  . TUBAL LIGATION    . wrist fistula repair Left    dialysis for one year    Family History  Problem Relation Age of Onset  . Cancer Mother        liver  . Heart disease Father   . Cancer Father        colon  . Arrhythmia Brother        Atrial Fibrillation  . Arrhythmia Paternal Aunt        Atrial Fibrillation   Social History:  reports that she quit smoking about 15 years ago. Her smoking use included cigarettes. She has a 30.00 pack-year smoking history. She has never used smokeless tobacco. She reports that she does not drink alcohol or use drugs.  Allergies:  Allergies  Allergen Reactions  . Tetracycline Hives  . Hydromorphone Hcl Nausea And Vomiting  . Niacin     Other reaction(s): Other (See Comments) Mouth  blisters  . Niaspan [Niacin Er] Other (See Comments)    Mouth blisters  . Sulfa Antibiotics Nausea Only and Other (See Comments)    "Tears up stomach"  . Sulfonamide Derivatives Other (See Comments)    Reaction: per patient "tears her stomach up"  . Codeine Nausea And Vomiting  . Erythromycin Nausea And Vomiting  . Hydromorphone Hcl Nausea And Vomiting  . Morphine And Related Nausea And Vomiting  . Nalbuphine Nausea And Vomiting  . Sulfasalazine Nausea Only    Other reaction(s): Other (See Comments) Reaction: per patient "tears her stomach up" Other reaction(s): Other (See Comments) "Tears up  stomach"  . Tape Rash    No "plastic" tape," please No "plastic" tape," please     (Not in a hospital admission)  No results found for this or any previous visit (from the past 48 hour(s)). No results found.  Review of Systems  HENT: Positive for hearing loss.   Respiratory: Positive for cough and shortness of breath.   Genitourinary: Positive for dysuria and hematuria.  Musculoskeletal: Positive for joint pain.  Endo/Heme/Allergies: Bruises/bleeds easily.  Psychiatric/Behavioral: Positive for depression. The patient is nervous/anxious.   All other systems reviewed and are negative.   There were no vitals taken for this visit. Physical Exam  Constitutional: She is oriented to person, place, and time. She appears well-developed and well-nourished. No distress.  HENT:  Head: Normocephalic and atraumatic.  Eyes: Pupils are equal, round, and reactive to light. Conjunctivae and EOM are normal.  Neck: Normal range of motion. Neck supple.  Cardiovascular: Normal rate and intact distal pulses.  Respiratory: Effort normal. No respiratory distress.  GI: Soft. She exhibits no distension. There is no tenderness.  Musculoskeletal:       Left knee: She exhibits swelling. She exhibits no effusion and no erythema. Tenderness found. Lateral joint line tenderness noted.  Neurological: She is  alert and oriented to person, place, and time.  Skin: Skin is warm and dry.  Psychiatric: She has a normal mood and affect. Her behavior is normal.     Assessment/Plan I am used to actually seeing a little bit more on the x-ray to indicate the necessity of a total knee.  I have told her that there is probably at least a 25-30% chance that an arthroscopic procedure would not help her.  Her alignment, though, is not significantly valgus on weight bearing or flexion views which probably would help her a bit.  Again, based on her medical history, a total knee may be in the future, but it would certainly be more complex relative to risk for the patient.  She understands there is the risk of it not helping, but I think in my own hands particularly I would be more comfortable trying arthroscopic evaluation left knee with lateral meniscectomy, and chondroplasty.  The risks and benefits were discussed in detail with the patient.  Will need clearances from PMD, nephrology, pain management, and cardiology and proceeding per their guidelines.  We will see about outpatient versus other options for her.   Chriss Czar, PA-C 11/09/2017, 1:30 PM

## 2017-11-09 NOTE — H&P (View-Only) (Signed)
Katherine [name] is an 62 y.o. female.   Chief Complaint: left knee pain HPI: History of well-functioning kidney transplant.  She has an MRI of the left knee that shows a torn lateral meniscus and relatively significant degenerative change.  However, I don't see, even on flexed weight bearing view today anything close to bone-on-bone.  She is having giving way.  Severely degenerated torn lateral meniscus; again, lateral compartment changes.  Past Medical History:  Diagnosis Date  . Anxiety   . Bilateral carotid artery stenosis    Carotid duplex 0/1027: 2-53% LICA, 66-44% RICA, >03% RECA, f/u 1 yr suggested  . CAD (coronary artery disease) of bypass graft 5/01; 3/'02, 8/'03, 10/'04; 1/15   PCI x 5 to SVG-D1   . CAD in native artery 07/1993    3 Vessel Disease (LAD-D1 & RCA) -- CABG  . CAD S/P percutaneous coronary angioplasty    PCI to SVG-D1 insertion/native D1 x 4 = '01 -(S660 BMS 2.5 x 9 - insertion into D1; '02 - distal overlap ACS Pixel 2.5 x 8  BMS; '03 distal/native ISR/Thrombosis - Pixel 2.5 x 13; '04 - ISR-  Taxus 2.5 x 20 (covered all);; 1/15 - mid SVG-D1 (50% distal ISR) - Promus P 2.75 x 20 -- 2.8 mm  . COPD mixed type ([place])    Followed by Dr. Lamonte [name] "pulmonologist said no COPD"  . Depression with anxiety   . Diabetes mellitus type 2 in obese ([place])   . Dyslipidemia, goal LDL below 70    08/2012: TC 137, TG 200, HDL 32!, LDL 45; on statin (followed by [facility]Deterding)  . ESRD (end stage renal disease) ([place]) 1991   s/p Cadaveric Renal Transplant [facility] - Dr. Jimmy [name])   . Family history of adverse reaction to anesthesia    mom's bp dropped during/after anesthesia  . Fibromyalgia   . GERD (gastroesophageal reflux disease)   . Glomerulonephritis, chronic, rapidly progressive 40  . 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 07/1993   Rescue PTCA of RCA -- referred for CABG.  . H/O: GI bleed   . Hypertension associated with diabetes ([place])   . MRSA  (methicillin resistant staph aureus) culture positive   . Obesity    s/p Lap Band 04/2004- Port Replacement 10/09 & 2/10 for infection; Dr. Excell [name].  . OSA (obstructive sleep apnea)    no longer on CPAP or home O2  . PAD (peripheral artery disease) ([place]) 08/2013   LEA Dopplers to be read by Dr. Fletcher [name]  . PAF (paroxysmal atrial fibrillation) ([place]) 06/2014   Noted on CardioNet Monitor  - rate ~112; no recurrent symptoms. Nonionic regulation because of frequent GI procedures. Patient prefers Plavix  . Recurrent boils    Bilateral Groin  . Rheumatoid arthritis ([place])    Per Patient Report; associated with OA  . 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 Servando [name]: LIMA-LAD, SVG-bifurcatingD1, SVG-rPDA  . S/p cadaver renal transplant Katherine [name]  . Stroke Katherine [name]) 2012   "right eye stroke- half blind now"  . Unstable angina ([place]) 5/01; 3/'02, 8/'03, 10/'04; 1/15   x 5 occurences since Inf-Post STEMI in 1994    Past Surgical History:  Procedure Laterality Date  . CARDIAC CATHETERIZATION  5/'01, 3/'02, 8/'03, 10/'04; 1/'15   08/22/2013: LAD & RCA 100%; LIMA-LAD & SVG-rPDA patent; Cx-- OM1 60%, OM2 ostial ~50%; SVG-D1 - 80% mid, 50% distal ISR --PCI  . CATHETER REMOVAL    . CHOLECYSTECTOMY N/A 10/29/2014  Procedure: LAPAROSCOPIC CHOLECYSTECTOMY WITH INTRAOPERATIVE CHOLANGIOGRAM;  Surgeon: Katherine Seltzer, MD;  Location: [facility];  Service: General;  Laterality: N/A;  . CORONARY ANGIOPLASTY  1994   x5  . CORONARY ARTERY BYPASS GRAFT  1995   LIMA-LAD, SVG-RPDA, SVG-D1  . ESOPHAGOGASTRODUODENOSCOPY N/A 10/15/2016   Procedure: ESOPHAGOGASTRODUODENOSCOPY (EGD);  Surgeon: Wilford Corner, MD;  Location: [name];  Service: Endoscopy;  Laterality: N/A;  . INCISE AND DRAIN ABCESS    . KIDNEY TRANSPLANT  1991  . LAPAROSCOPIC GASTRIC BANDING  04/2004; 10/'09, 2/'10   Port Replacement x 2  . LEFT HEART CATHETERIZATION WITH CORONARY/GRAFT ANGIOGRAM N/A 08/23/2013   Procedure: LEFT HEART CATHETERIZATION WITH  Beatrix Fetters;  Surgeon: Wellington Hampshire, MD;  Location: [place] CATH LAB;  Service: Cardiovascular;  Laterality: N/A;  . Lower Extremity Arterial Dopplers  08/2013   ABI: R 0.96, L 1.04  . MULTIPLE TOOTH EXTRACTIONS  age 74  . NM MYOVIEW LTD  03/2016   EF 62%. LOW RISK. C/W prior MI - no Ischemia. Apical hypokinesis.  [name] Kitchen PERCUTANEOUS CORONARY STENT INTERVENTION (PCI-S)  5/'01, 3/'02, 8/'03, 10/'04;   '01 - S660 BMS 2.5 x 9 - dSVG-D1 into D1; '02- post-stent stenosis - 2.5 x 8 Pixel BMS; '8\03: ISR/Thrombosis into native D1 - AngioJet, 2.5 x 13 Pixel; '04 - ISR 95% - covered stented area with Taxus DES 2.5 mm x 20 (2.88)  . PERCUTANEOUS CORONARY STENT INTERVENTION (PCI-S)  08/23/2013   Procedure: PERCUTANEOUS CORONARY STENT INTERVENTION (PCI-S);  Surgeon: Wellington Hampshire, MD;  Location: [facility] CATH LAB;  Service: Cardiovascular;;mid SVG-D1 80%; distal stent ~50% ISR; Promus Prermier DES 2.75 mm xc 20 mm (2.8 mm)  . PORT-A-CATH REMOVAL     kidney  . TRANSTHORACIC ECHOCARDIOGRAM  02/2016   EF 55-60%. Septal dyssynergy from CABG GR 2 DD. Aortic valve is trileaflet the functional bicuspid with sclerosis but not yet notable for stenosis stenosis.; no Significant change  . TUBAL LIGATION    . wrist fistula repair Left    dialysis for one year    Family History  Problem Relation Age of Onset  . Cancer Mother        liver  . Heart disease Father   . Cancer Father        colon  . Arrhythmia Brother        Atrial Fibrillation  . Arrhythmia Paternal Aunt        Atrial Fibrillation   Social History:  reports that she quit smoking about 15 years ago. Her smoking use included cigarettes. She has a 30.00 pack-year smoking history. She has never used smokeless tobacco. She reports that she does not drink alcohol or use drugs.  Allergies:  Allergies  Allergen Reactions  . Tetracycline Hives  . Hydromorphone Hcl Nausea And Vomiting  . Niacin     Other reaction(s): Other (See Comments) Mouth  blisters  . Niaspan [Niacin Er] Other (See Comments)    Mouth blisters  . Sulfa Antibiotics Nausea Only and Other (See Comments)    "Tears up stomach"  . Sulfonamide Derivatives Other (See Comments)    Reaction: per patient "tears her stomach up"  . Codeine Nausea And Vomiting  . Erythromycin Nausea And Vomiting  . Hydromorphone Hcl Nausea And Vomiting  . Morphine And Related Nausea And Vomiting  . Nalbuphine Nausea And Vomiting  . Sulfasalazine Nausea Only    Other reaction(s): Other (See Comments) Reaction: per patient "tears her stomach up" Other reaction(s): Other (See Comments) "Tears up  stomach"  . Tape Rash    No "plastic" tape," please No "plastic" tape," please     (Not in a [name] admission)  No results found for this or any previous visit (from the past 48 hour(s)). No results found.  Review of Systems  HENT: Positive for hearing loss.   Respiratory: Positive for cough and shortness of breath.   Genitourinary: Positive for dysuria and hematuria.  Musculoskeletal: Positive for joint pain.  Endo/Heme/Allergies: Bruises/bleeds easily.  Psychiatric/Behavioral: Positive for depression. The patient is nervous/anxious.   All other systems reviewed and are negative.   There were no vitals taken for this visit. Physical Exam  Constitutional: She is oriented to person, place, and time. She appears well-developed and well-nourished. No distress.  HENT:  Head: Normocephalic and atraumatic.  Eyes: Pupils are equal, round, and reactive to light. Conjunctivae and EOM are normal.  Neck: Normal range of motion. Neck supple.  Cardiovascular: Normal rate and intact distal pulses.  Respiratory: Effort normal. No respiratory distress.  GI: Soft. She exhibits no distension. There is no tenderness.  Musculoskeletal:       Left knee: She exhibits swelling. She exhibits no effusion and no erythema. Tenderness found. Lateral joint line tenderness noted.  Neurological: She is  alert and oriented to person, place, and time.  Skin: Skin is warm and dry.  Psychiatric: She has a normal mood and affect. Her behavior is normal.     Assessment/Plan I am used to actually seeing a little bit more on the x-ray to indicate the necessity of a total knee.  I have told her that there is probably at least a 25-30% chance that an arthroscopic procedure would not help her.  Her alignment, though, is not significantly valgus on weight bearing or flexion views which probably would help her a bit.  Again, based on her medical history, a total knee may be in the future, but it would certainly be more complex relative to risk for the patient.  She understands there is the risk of it not helping, but I think in my own hands particularly I would be more comfortable trying arthroscopic evaluation left knee with lateral meniscectomy, and chondroplasty.  The risks and benefits were discussed in detail with the patient.  Will need clearances from PMD, nephrology, pain management, and cardiology and proceeding per their guidelines.  We will see about outpatient versus other options for her.   Chriss Czar, PA-C 11/09/2017, 1:30 PM

## 2017-11-13 IMAGING — DX DG CHEST 2V
2 series · 2 of 2 positions shown · non-contrast
Comparison: Chest radiograph 10/20/2016

CLINICAL DATA: Follow-up pneumonia.  Cough.

EXAM:
CHEST  2 VIEW

[chest pa]
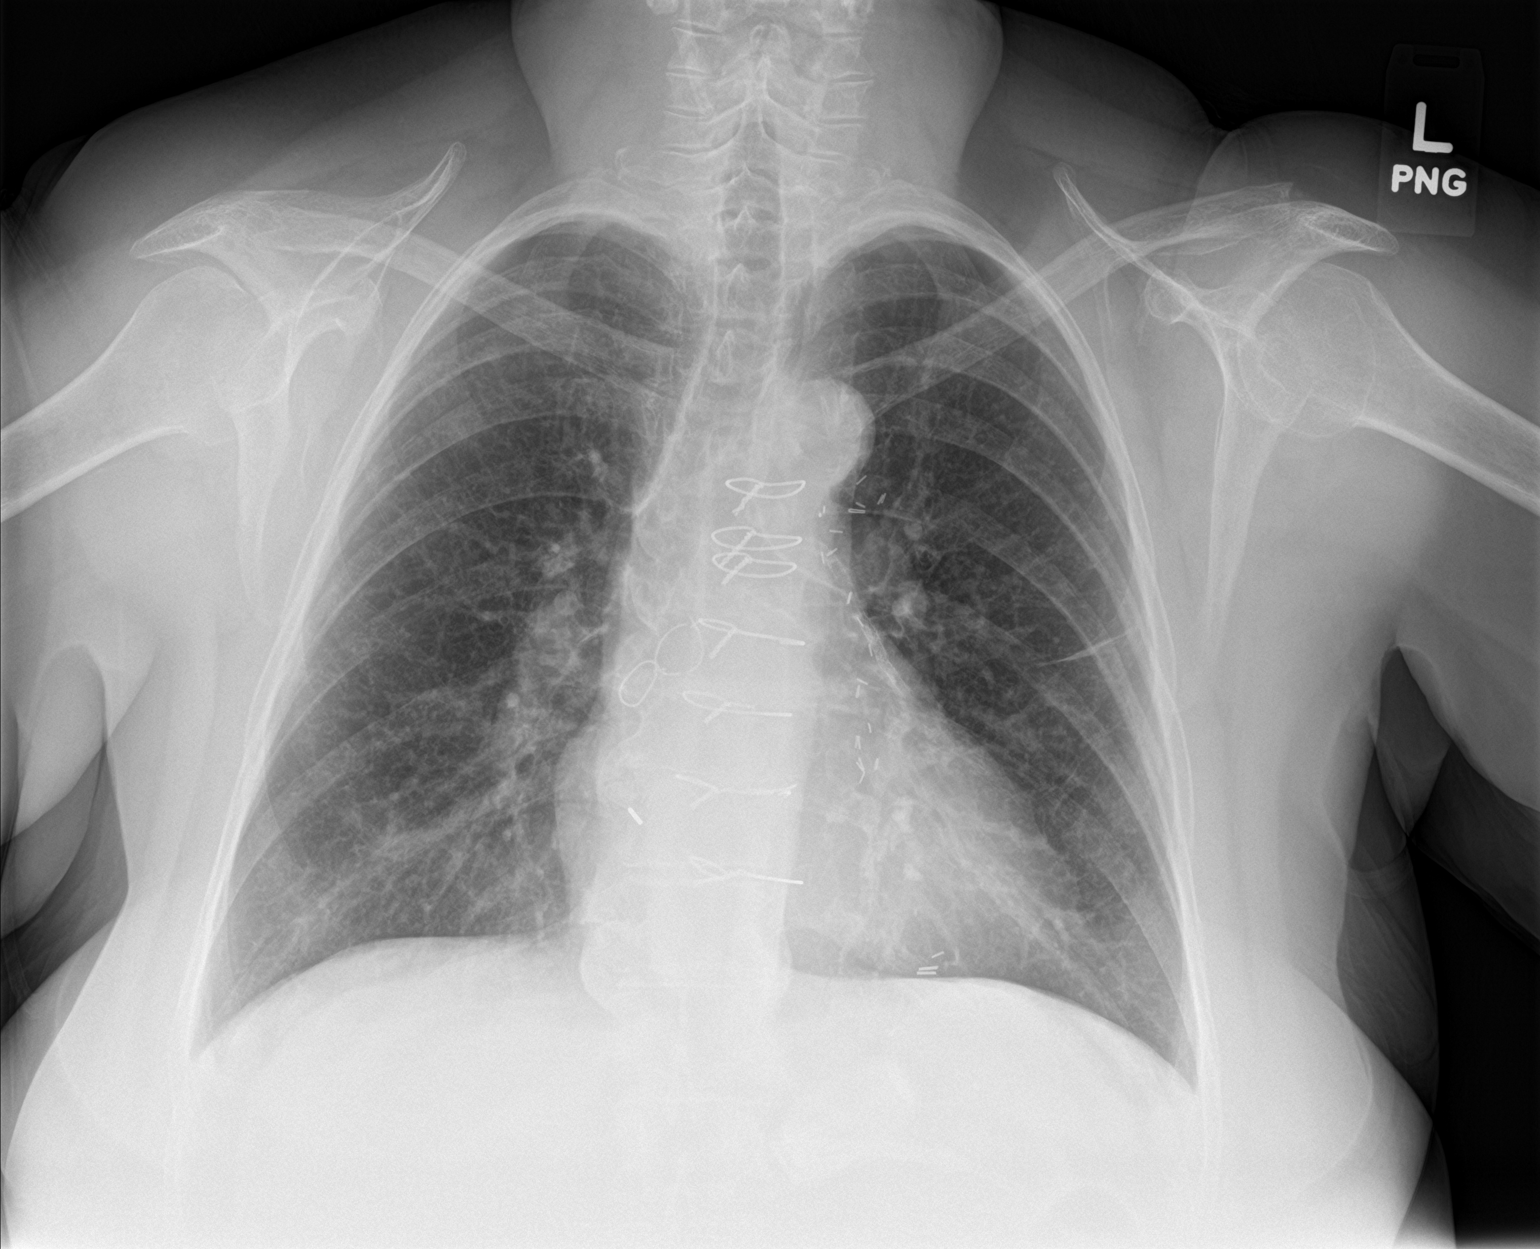

[chest lat]
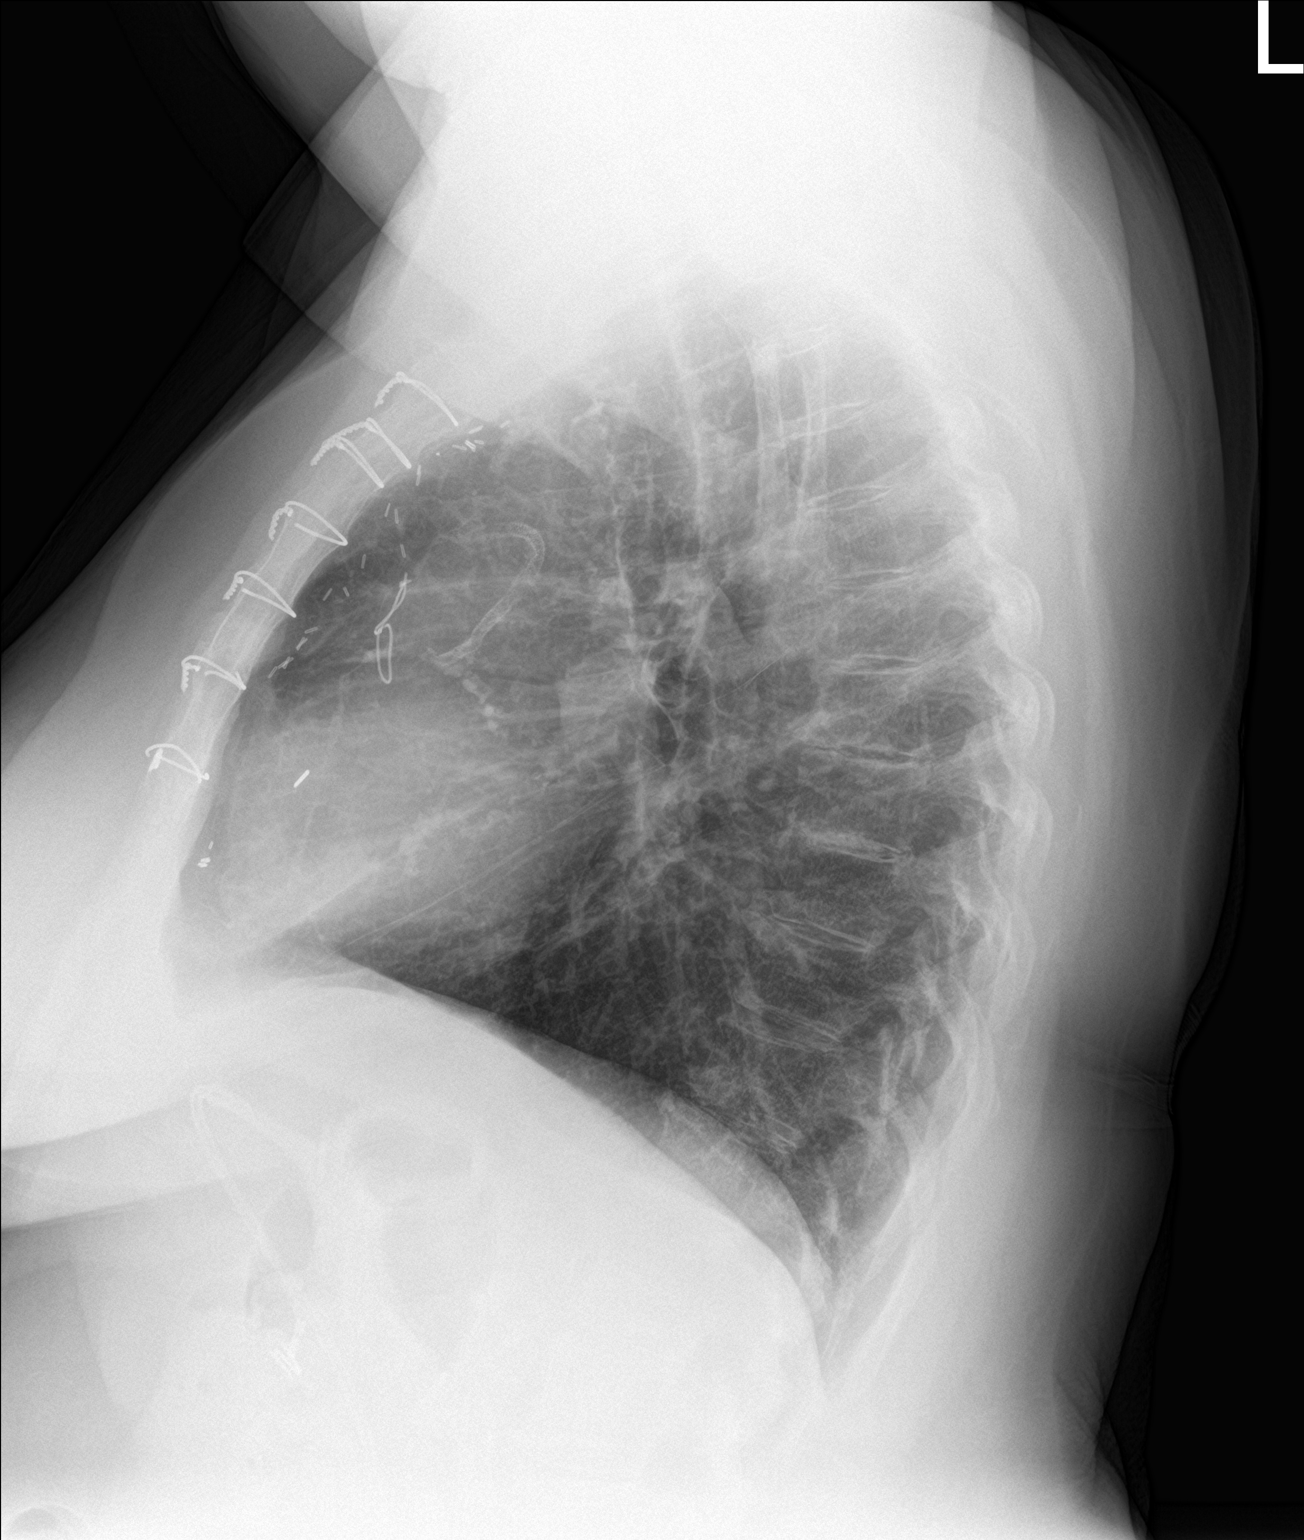

[2 of 2 positions shown; findings below may reference images not displayed]

FINDINGS: Normal cardiac and mediastinal contours. Patient status post median
sternotomy and CABG procedure. Similar appearing nodular densities
within the right upper lobe. No pleural effusion or pneumothorax.
Thoracic spine degenerative changes.
IMPRESSION: Similar appearing nodular densities within the right upper lobe,
potentially infectious or inflammatory. Continued radiographic
follow-up is recommended to ensure resolution.

## 2017-11-23 ENCOUNTER — Other Ambulatory Visit: Payer: Self-pay | Admitting: Cardiology

## 2017-11-23 MED ORDER — METOPROLOL TARTRATE 25 MG PO TABS: ORAL_TABLET | ORAL | 3 refills | Status: DC

## 2017-11-23 NOTE — Telephone Encounter (Signed)
REFILLED X 3 - 90 DAY SUPPLY.

## 2017-11-23 NOTE — Addendum Note (Signed)
Addended by: Raiford Simmonds on: 11/23/2017 02:10 PM   Modules accepted: Orders

## 2017-11-24 ENCOUNTER — Encounter (HOSPITAL_BASED_OUTPATIENT_CLINIC_OR_DEPARTMENT_OTHER): Payer: Self-pay | Admitting: *Deleted

## 2017-11-24 NOTE — Progress Notes (Signed)
Pt requesting to be switched to main hospital due complicated and chronic medical situation.

## 2017-11-26 NOTE — Progress Notes (Signed)
Anesthesia Chart Review:  Pt is a same day work up. Pt was scheduled to have procedure at day surgery but was moved to [facility] due to pt preference.   Pt is a 62 year old female scheduled for L knee arthroscopy with lateral menisectomy on 12/03/2017 with Earlie Server, MD  Providers:  - Cardiologist is [name], MD who cleared pt for surgery at intermediate to high risk for surgery. Last office visit 10/07/17  - Pulmonologist is Baltazar Apo, MD who cleared pt for surgery at last office visit 10/19/17 noting: "Assuming no significant decrease in function she should be mild to moderate increased risk due to her several medical problems.  There is nothing to preclude her from having the surgery.  She is improved from her recent exacerbation of cough"  - Nephrologist is Mauricia Area, MD who cleared pt for surgery   PMH includes:  CAD (s/p CABG 1995; s/p PCI x5 to SVG-D1), carotid stenosis, PAF, stroke, PAD, HTN, DM, dyslipidemia, OSA (no longer needs CPAP due to weight loss), COPD, ESRD (s/p cadaveric transplant 1991), RA. Former smoker (quit 2004). BMI 41.5. S/p gastric banding x3 (last in 2010).   Medications include: Albuterol, imuran, Symbicort, Tegretol, Plavix, diltiazem, Pepcid, iron, glimepiride, Lantus, metformin, metoprolol, Prilosec, prednisone, rosuvastatin. Pt to hold plavix 5-7 days before surgery  Labs will be obtained day of surgery.   CXR 10/12/17: Stable mild cardiomegaly.  No active lung disease  EKG 10/07/17: NSR. LBBB.   Echo 10/27/17:  - Left ventricle: The cavity size was normal. Wall thickness was increased in a pattern of moderate LVH. Systolic function was normal. The estimated ejection fraction was in the range of 55% to 60%. Wall motion was normal; there were no regional wall motion abnormalities. Features are consistent with a pseudonormal left ventricular filling pattern, with concomitant abnormal relaxation and increased filling pressure (grade 2 diastolic  dysfunction). - Aortic valve: Mildly calcified annulus. Moderately thickened, moderately calcified leaflets. There was mild stenosis. Valve area (VTI): 1.93 cm^2. Valve area (Vmax): 1.9 cm^2. Valve area (Vmean): 1.79 cm^2.  Nuclear stress test 03/19/16:   Nuclear stress EF: 62%.  There was no ST segment deviation noted during stress.  Findings consistent with prior myocardial infarction.  This is a low risk study.  The left ventricular ejection fraction is normal (55-65%). - Small anteroapical infarct with no ischemia - EF 62% mild apical hypokinesis  Carotid duplex 02/28/16:  - Stable heterogeneous plaque, with shadowing, bilaterally. - Progression of RICA stenosis, high end of 40-59% range. - Stable 5-27% LICA stenosis. - >50% RECA stenosis. - Normal subclavian arteries, bilaterally. - Patent vertebral arteries with antegrade flow.  Cardiac cath 08/23/13:  1. Severe 2 vessel CAD with patent LIMA to LAD, SVG to RPDA and SVG to diagonal. However, there is 80% stenosis in SVG to diagonal proximal to previously placed stents.  2. Mildly elevated LVEDP.  3. Severe calcified disease in right common femoral artery.  4. Successful drug eluting stent placement to SVG to diagonal.  - Recommendations: Continue lifelong dual antiplatelet therapy  If labs acceptable day of surgery, I anticipate pt can proceed with surgery as scheduled.   Willeen Cass, FNP-BC [facility]/Anesthesiology Phone: [phone] 11/26/2017 4:57 PM

## 2017-11-30 ENCOUNTER — Encounter: Payer: Self-pay | Admitting: Internal Medicine

## 2017-11-30 ENCOUNTER — Ambulatory Visit: Payer: Self-pay | Admitting: Physician Assistant

## 2017-11-30 ENCOUNTER — Ambulatory Visit (INDEPENDENT_AMBULATORY_CARE_PROVIDER_SITE_OTHER): Payer: Medicare Other | Admitting: Internal Medicine

## 2017-11-30 ENCOUNTER — Other Ambulatory Visit (INDEPENDENT_AMBULATORY_CARE_PROVIDER_SITE_OTHER): Payer: Medicare Other

## 2017-11-30 VITALS — BP 134/70 | HR 81 | Temp 97.9°F | Ht 61.0 in | Wt 234.8 lb

## 2017-11-30 DIAGNOSIS — E785 Hyperlipidemia, unspecified: Secondary | ICD-10-CM

## 2017-11-30 DIAGNOSIS — E1142 Type 2 diabetes mellitus with diabetic polyneuropathy: Secondary | ICD-10-CM

## 2017-11-30 DIAGNOSIS — J342 Deviated nasal septum: Secondary | ICD-10-CM | POA: Diagnosis not present

## 2017-11-30 DIAGNOSIS — Z9861 Coronary angioplasty status: Secondary | ICD-10-CM

## 2017-11-30 DIAGNOSIS — Z23 Encounter for immunization: Secondary | ICD-10-CM | POA: Diagnosis not present

## 2017-11-30 DIAGNOSIS — J320 Chronic maxillary sinusitis: Secondary | ICD-10-CM | POA: Diagnosis not present

## 2017-11-30 DIAGNOSIS — R399 Unspecified symptoms and signs involving the genitourinary system: Secondary | ICD-10-CM | POA: Diagnosis not present

## 2017-11-30 DIAGNOSIS — I2510 Atherosclerotic heart disease of native coronary artery without angina pectoris: Secondary | ICD-10-CM

## 2017-11-30 DIAGNOSIS — J322 Chronic ethmoidal sinusitis: Secondary | ICD-10-CM | POA: Diagnosis not present

## 2017-11-30 LAB — HEMOGLOBIN A1C: Hgb A1c MFr Bld: 7.2 % — ABNORMAL HIGH (ref 4.6–6.5)

## 2017-11-30 LAB — POCT UA - MICROALBUMIN
CREATININE, POC: 50 mg/dL
MICROALBUMIN (UR) POC: 30 mg/L

## 2017-11-30 LAB — LIPID PANEL
CHOL/HDL RATIO: 2
Cholesterol: 111 mg/dL (ref 0–200)
HDL: 53.9 mg/dL (ref >39.00)
LDL Cholesterol: 32 mg/dL (ref 0–99)
NonHDL: 57.42
TRIGLYCERIDES: 129 mg/dL (ref 0.0–149.0)
VLDL: 25.8 mg/dL (ref 0.0–40.0)

## 2017-11-30 NOTE — Patient Instructions (Signed)
Type 2 Diabetes Mellitus, Diagnosis, Adult Type 2 diabetes (type 2 diabetes mellitus) is a long-term (chronic) disease. In type 2 diabetes, one or both of these problems may be present:  The pancreas does not make enough of a hormone called insulin.  Cells in the body do not respond properly to insulin that the body makes (insulin resistance).  Normally, insulin allows blood sugar (glucose) to enter cells in the body. The cells use glucose for energy. Insulin resistance or lack of insulin causes excess glucose to build up in the blood instead of going into cells. As a result, high blood glucose (hyperglycemia) develops. What increases the risk? The following factors may make you more likely to develop type 2 diabetes:  Having a family member with type 2 diabetes.  Being overweight or obese.  Having an inactive (sedentary) lifestyle.  Having been diagnosed with insulin resistance.  Having a history of prediabetes, gestational diabetes, or polycystic ovarian syndrome (PCOS).  Being of American-Indian, African-American, Hispanic/Latino, or Asian/Pacific Islander descent.  What are the signs or symptoms? In the early stage of this condition, you may not have symptoms. Symptoms develop slowly and may include:  Increased thirst (polydipsia).  Increased hunger(polyphagia).  Increased urination (polyuria).  Increased urination during the night (nocturia).  Unexplained weight loss.  Frequent infections that keep coming back (recurring).  Fatigue.  Weakness.  Vision changes, such as blurry vision.  Cuts or bruises that are slow to heal.  Tingling or numbness in the hands or feet.  Dark patches on the skin (acanthosis nigricans).  How is this diagnosed?  This condition is diagnosed based on your symptoms, your medical history, a physical exam, and your blood glucose level. Your blood glucose may be checked with one or more of the following blood tests:  A fasting blood  glucose (FBG) test. You will not be allowed to eat (you will fast) for at least 8 hours before a blood sample is taken.  A random blood glucose test. This checks blood glucose at any time of day regardless of when you ate.  An A1c (hemoglobin A1c) blood test. This provides information about blood glucose control over the previous 2-3 months.  An oral glucose tolerance test (OGTT). This measures your blood glucose at two times: ? After fasting. This is your baseline blood glucose level. ? Two hours after drinking a beverage that contains glucose.  You may be diagnosed with type 2 diabetes if:  Your FBG level is 126 mg/dL (7.0 mmol/L) or higher.  Your random blood glucose level is 200 mg/dL (11.1 mmol/L) or higher.  Your A1c level is 6.5% or higher.  Your OGGT result is higher than 200 mg/dL (11.1 mmol/L).  These blood tests may be repeated to confirm your diagnosis. How is this treated?  Your treatment may be managed by a specialist called an endocrinologist. Type 2 diabetes may be treated by following instructions from your health care provider about:  Making diet and lifestyle changes. This may include: ? Following an individualized nutrition plan that is developed by a diet and nutrition specialist (registered dietitian). ? Exercising regularly. ? Finding ways to manage stress.  Checking your blood glucose level as often as directed.  Taking diabetes medicines or insulin daily. This helps to keep your blood glucose levels in the healthy range. ? If you use insulin, you may need to adjust the dosage depending on how physically active you are and what foods you eat. Your health care provider will tell you how   to adjust your dosage.  Taking medicines to help prevent complications from diabetes, such as: ? Aspirin. ? Medicine to lower cholesterol. ? Medicine to control blood pressure.  Your health care provider will set individualized treatment goals for you. Your goals will be  based on your age, other medical conditions you have, and how you respond to diabetes treatment. Generally, the goal of treatment is to maintain the following blood glucose levels:  Before meals (preprandial): 80-130 mg/dL (4.4-7.2 mmol/L).  After meals (postprandial): below 180 mg/dL (10 mmol/L).  A1c level: less than 7%.  Follow these instructions at home: Questions to Ask Your Health Care Provider Consider asking the following questions:  Do I need to meet with a diabetes educator?  Where can I find a support group for people with diabetes?  What equipment will I need to manage my diabetes at home?  What diabetes medicines do I need, and when should I take them?  How often do I need to check my blood glucose?  What number can I call if I have questions?  When is my next appointment?  General instructions  Take over-the-counter and prescription medicines only as told by your health care provider.  Keep all follow-up visits as told by your health care provider. This is important.  For more information about diabetes, visit: ? American Diabetes Association (ADA): www.diabetes.org ? American Association of Diabetes Educators (AADE): www.diabeteseducator.org/patient-resources Contact a health care provider if:  Your blood glucose is at or above 240 mg/dL (13.3 mmol/L) for 2 days in a row.  You have been sick or have had a fever for 2 days or longer and you are not getting better.  You have any of the following problems for more than 6 hours: ? You cannot eat or drink. ? You have nausea and vomiting. ? You have diarrhea. Get help right away if:  Your blood glucose is lower than 54 mg/dL (3.0 mmol/L).  You become confused or you have trouble thinking clearly.  You have difficulty breathing.  You have moderate or large ketone levels in your urine. This information is not intended to replace advice given to you by your health care provider. Make sure you discuss any  questions you have with your health care provider. Document Released: 08/03/2005 Document Revised: 01/09/2016 Document Reviewed: 09/06/2015 Elsevier Interactive Patient Education  2018 [employer]  

## 2017-11-30 NOTE — H&P (Signed)
Katherine [name] is an 62 y.o. female.   Chief Complaint: left knee pain HPI: History of well-functioning kidney transplant.  She has an MRI of the left knee that shows a torn lateral meniscus and relatively significant degenerative change.  However, I don't see, even on flexed weight bearing view today anything close to bone-on-bone.  She is having giving way.  Severely degenerated torn lateral meniscus; again, lateral compartment changes.  Past Medical History:  Diagnosis Date  . Anxiety   . Bilateral carotid artery stenosis    Carotid duplex 02/6733: 1-93% LICA, 79-02% RICA, >40% RECA, f/u 1 yr suggested  . CAD (coronary artery disease) of bypass graft 5/01; 3/'02, 8/'03, 10/'04; 1/15   PCI x 5 to SVG-D1   . CAD in native artery 07/1993    3 Vessel Disease (LAD-D1 & RCA) -- CABG  . CAD S/P percutaneous coronary angioplasty    PCI to SVG-D1 insertion/native D1 x 4 = '01 -(S660 BMS 2.5 x 9 - insertion into D1; '02 - distal overlap ACS Pixel 2.5 x 8  BMS; '03 distal/native ISR/Thrombosis - Pixel 2.5 x 13; '04 - ISR-  Taxus 2.5 x 20 (covered all);; 1/15 - mid SVG-D1 (50% distal ISR) - Promus P 2.75 x 20 -- 2.8 mm  . COPD mixed type ([place])    Followed by Dr. Lamonte Sakai "pulmonologist said no COPD"  . Depression with anxiety   . Diabetes mellitus type 2 in obese ([place])   . Dyslipidemia, goal LDL below 70    08/2012: TC 137, TG 200, HDL 32!, LDL 45; on statin (followed by Dr.Deterding)  . ESRD (end stage renal disease) ([place]) 1991   s/p Cadaveric Renal Transplant [facility] - Dr. Jimmy Footman)   . Family history of adverse reaction to anesthesia    mom's bp dropped during/after anesthesia  . Fibromyalgia   . GERD (gastroesophageal reflux disease)   . Glomerulonephritis, chronic, rapidly progressive 68  . 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 07/1993   Rescue PTCA of RCA -- referred for CABG.  . H/O: GI bleed   . Hypertension associated with diabetes ([place])   . MRSA  (methicillin resistant staph aureus) culture positive   . Obesity    s/p Lap Band 04/2004- Port Replacement 10/09 & 2/10 for infection; Dr. Excell Seltzer.  . OSA (obstructive sleep apnea)    no longer on CPAP or home O2, states she doesn't need now after lap band  . PAD (peripheral artery disease) ([place]) 08/2013   LEA Dopplers to be read by Dr. Fletcher Anon  . PAF (paroxysmal atrial fibrillation) ([place]) 06/2014   Noted on CardioNet Monitor  - rate ~112; no recurrent symptoms. Nonionic regulation because of frequent GI procedures. Patient prefers Plavix  . Recurrent boils    Bilateral Groin  . Rheumatoid arthritis ([place])    Per Patient Report; associated with OA  . 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 Servando Snare: LIMA-LAD, SVG-bifurcatingD1, SVG-rPDA  . S/p cadaver renal transplant [place]  . Stroke [facility]) 2012   "right eye stroke- half blind now"  . Unstable angina ([place]) 5/01; 3/'02, 8/'03, 10/'04; 1/15   x 5 occurences since Inf-Post STEMI in 1994    Past Surgical History:  Procedure Laterality Date  . CARDIAC CATHETERIZATION  5/'01, 3/'02, 8/'03, 10/'04; 1/'15   08/22/2013: LAD & RCA 100%; LIMA-LAD & SVG-rPDA patent; Cx-- OM1 60%, OM2 ostial ~50%; SVG-D1 - 80% mid, 50% distal ISR --PCI  . CATHETER REMOVAL    .  CHOLECYSTECTOMY N/A 10/29/2014   Procedure: LAPAROSCOPIC CHOLECYSTECTOMY WITH INTRAOPERATIVE CHOLANGIOGRAM;  Surgeon: Excell Seltzer, MD;  Location: [facility] ORS;  Service: General;  Laterality: N/A;  . CORONARY ANGIOPLASTY  1994   x5  . CORONARY ARTERY BYPASS GRAFT  1995   LIMA-LAD, SVG-RPDA, SVG-D1  . ESOPHAGOGASTRODUODENOSCOPY N/A 10/15/2016   Procedure: ESOPHAGOGASTRODUODENOSCOPY (EGD);  Surgeon: Wilford Corner, MD;  Location: [facility] ENDOSCOPY;  Service: Endoscopy;  Laterality: N/A;  . INCISE AND DRAIN ABCESS    . KIDNEY TRANSPLANT  1991  . LAPAROSCOPIC GASTRIC BANDING  04/2004; 10/'09, 2/'10   Port Replacement x 2  . LEFT HEART CATHETERIZATION WITH CORONARY/GRAFT ANGIOGRAM N/A 08/23/2013   Procedure:  LEFT HEART CATHETERIZATION WITH Beatrix Fetters;  Surgeon: Wellington Hampshire, MD;  Location: [facility] CATH LAB;  Service: Cardiovascular;  Laterality: N/A;  . Lower Extremity Arterial Dopplers  08/2013   ABI: R 0.96, L 1.04  . MULTIPLE TOOTH EXTRACTIONS  age 65  . NM MYOVIEW LTD  03/2016   EF 62%. LOW RISK. C/W prior MI - no Ischemia. Apical hypokinesis.  [name] Kitchen PERCUTANEOUS CORONARY STENT INTERVENTION (PCI-S)  5/'01, 3/'02, 8/'03, 10/'04;   '01 - S660 BMS 2.5 x 9 - dSVG-D1 into D1; '02- post-stent stenosis - 2.5 x 8 Pixel BMS; '8\03: ISR/Thrombosis into native D1 - AngioJet, 2.5 x 13 Pixel; '04 - ISR 95% - covered stented area with Taxus DES 2.5 mm x 20 (2.88)  . PERCUTANEOUS CORONARY STENT INTERVENTION (PCI-S)  08/23/2013   Procedure: PERCUTANEOUS CORONARY STENT INTERVENTION (PCI-S);  Surgeon: Wellington Hampshire, MD;  Location: [facility] CATH LAB;  Service: Cardiovascular;;mid SVG-D1 80%; distal stent ~50% ISR; Promus Prermier DES 2.75 mm xc 20 mm (2.8 mm)  . PORT-A-CATH REMOVAL     kidney  . TRANSTHORACIC ECHOCARDIOGRAM  02/2016   EF 55-60%. Septal dyssynergy from CABG GR 2 DD. Aortic valve is trileaflet the functional bicuspid with sclerosis but not yet notable for stenosis stenosis.; no Significant change  . TUBAL LIGATION    . wrist fistula repair Left    dialysis for one year    Family History  Problem Relation Age of Onset  . Cancer Mother        liver  . Heart disease Father   . Cancer Father        colon  . Arrhythmia Brother        Atrial Fibrillation  . Arrhythmia Paternal Aunt        Atrial Fibrillation   Social History:  reports that she quit smoking about 15 years ago. Her smoking use included cigarettes. She has a 30.00 pack-year smoking history. She has never used smokeless tobacco. She reports that she does not drink alcohol or use drugs.  Allergies:  Allergies  Allergen Reactions  . Tetracycline Hives  . Hydromorphone Hcl Nausea And Vomiting  . Niacin Other (See Comments)     Mouth blisters  . Niaspan [Niacin Er] Other (See Comments)    Mouth blisters  . Sulfa Antibiotics Nausea Only and Other (See Comments)    "Tears up stomach"  . Sulfonamide Derivatives Other (See Comments)    Reaction: per patient "tears her stomach up"  . Codeine Nausea And Vomiting  . Erythromycin Nausea And Vomiting  . Hydromorphone Hcl Nausea And Vomiting  . Morphine And Related Nausea And Vomiting  . Nalbuphine Nausea And Vomiting  . Sulfasalazine Nausea Only and Other (See Comments)    per patient "tears her stomach up", "Tears up stomach"  . Tape Rash  and Other (See Comments)    No "plastic" tape," please     (Not in a hospital admission)  Results for orders placed or performed in visit on 11/30/17 (from the past 48 hour(s))  POCT UA - Microalbumin     Status: Abnormal   Collection Time: 11/30/17  4:56 PM  Result Value Ref Range   Microalbumin Ur, POC 30 mg/L   Creatinine, POC 50 mg/dL   Albumin/Creatinine Ratio, Urine, POC 30-300    No results found.  Review of Systems  HENT: Positive for hearing loss.   Respiratory: Positive for cough and shortness of breath.   Genitourinary: Positive for dysuria and hematuria.  Musculoskeletal: Positive for joint pain.  Endo/Heme/Allergies: Bruises/bleeds easily.  Psychiatric/Behavioral: Positive for depression. The patient is nervous/anxious.   All other systems reviewed and are negative.   There were no vitals taken for this visit. Physical Exam  Constitutional: She is oriented to person, place, and time. She appears well-developed and well-nourished. No distress.  HENT:  Head: Normocephalic and atraumatic.  Eyes: Pupils are equal, round, and reactive to light. Conjunctivae are normal.  Neck: Normal range of motion. Neck supple.  Cardiovascular: Normal rate and intact distal pulses.  Respiratory: Effort normal. No respiratory distress.  GI: Soft. She exhibits no distension. There is no tenderness.  Musculoskeletal:        Left knee: She exhibits swelling. She exhibits no effusion. Tenderness found. Lateral joint line tenderness noted.  Neurological: She is alert and oriented to person, place, and time.  Skin: Skin is warm and dry. No rash noted. No erythema.  Psychiatric: She has a normal mood and affect. Her behavior is normal.     Assessment/Plan I am used to actually seeing a little bit more on the x-ray to indicate the necessity of a total knee.  I have told her that there is probably at least a 25-30% chance that an arthroscopic procedure would not help her.  Her alignment, though, is not significantly valgus on weight bearing or flexion views which probably would help her a bit.  Again, based on her medical history, a total knee may be in the future, but it would certainly be more complex relative to risk for the patient.  She understands there is the risk of it not helping, but I think in my own hands particularly I would be more comfortable trying arthroscopic evaluation left knee with lateral meniscectomy, and chondroplasty.  The risks and benefits were discussed in detail with the patient.  Will need clearances from PMD, nephrology, pain management, and cardiology and proceeding per their guidelines.  We will see about outpatient versus other options for her.   Chriss Czar, PA-C 11/30/2017, 5:39 PM

## 2017-11-30 NOTE — Progress Notes (Signed)
Eye Exam - no up to date:  Pap - Dr. Radene Knee with [facility].  Colonoscopy - Dr. Doug Sou with [facility]

## 2017-11-30 NOTE — Progress Notes (Signed)
Subjective:  Patient ID: Katherine [name], female    DOB: [birth year]  Age: 62 y.o. MRN: [id number]  CC: Diabetes  NEW TO ME  HPI Timothy Townsel presents for establishing as a new patient.  She has type 2 diabetes mellitus.  She is status post renal transplant and is a vasculopath, status post recent MI and coronary artery bypass grafting.  She complains of weight gain but tells me her blood sugars have been relatively well controlled.  She uses basal insulin, a sulfonylurea, and low-dose metformin.  She is followed closely by nephrology.  She denies any recent episodes of polyuria, polydipsia, or polyphagia.  Outpatient Medications Prior to Visit  Medication Sig Dispense Refill  . acetaminophen (TYLENOL) 500 MG tablet Take 1 tablet (500 mg total) by mouth every 6 (six) hours as needed for moderate pain or headache. (Patient taking differently: Take 1,000 mg by mouth every 6 (six) hours as needed for moderate pain or headache. ) 30 tablet 0  . albuterol (VENTOLIN HFA) 108 (90 Base) MCG/ACT inhaler INHALE 2 PUFFS INTO THE LUNGS EVERY 6 (SIX) HOURS AS NEEDED FOR WHEEZING OR SHORTNESS OF BREATH. 18 Inhaler 2  . azaTHIOprine (IMURAN) 50 MG tablet Take 125 mg by mouth every evening.     . budesonide-formoterol (SYMBICORT) 160-4.5 MCG/ACT inhaler Inhale 2 puffs into the lungs 2 (two) times daily. 2 Inhaler 0  . calcitRIOL (ROCALTROL) 0.25 MCG capsule Take 0.25 mcg by mouth every 3 (three) days.     . carbamazepine (TEGRETOL) 200 MG tablet TAKE 1 TABLET BY MOUTH TWICE A DAY (Patient taking differently: TAKE 200 MG BY MOUTH TWICE A DAY) 60 tablet 1  . clopidogrel (PLAVIX) 75 MG tablet Take 1 tablet (75 mg total) by mouth daily. 90 tablet 3  . clotrimazole (MYCELEX) 10 MG troche Take 1 tablet (10 mg total) by mouth 5 (five) times daily. (Patient taking differently: Take 10 mg by mouth daily as needed (thrush). ) 70 tablet 0  . cyclobenzaprine (FLEXERIL) 10 MG tablet TAKE 1 TABLET BY MOUTH 3 TIMES  DAILY AS NEEDED FOR MUSCLE SPASMS (Patient taking differently: Take 10 mg by mouth twice daily) 90 tablet 2  . diltiazem (CARDIZEM CD) 120 MG 24 hr capsule TAKE 1 CAPSULE (120 MG TOTAL) BY MOUTH DAILY. NEED OV. 30 capsule 0  . DULoxetine (CYMBALTA) 30 MG capsule TAKE 2 CAPSULES (60 MG TOTAL) BY MOUTH EVERY MORNING. 60 capsule 3  . famotidine (PEPCID) 20 MG tablet Take 20 mg by mouth daily.    . ferrous sulfate 325 (65 FE) MG tablet Take 1 tablet (325 mg total) by mouth 3 (three) times daily with meals. (Patient taking differently: Take 325 mg by mouth 2 (two) times daily. ) 90 tablet 0  . fluticasone (FLONASE) 50 MCG/ACT nasal spray PLACE 2 SPRAYS INTO THE NOSE DAILY (Patient taking differently: Place 2 sprays into both nostrils 2 (two) times daily. ) 48 g 1  . glimepiride (AMARYL) 4 MG tablet Take 4 mg by mouth every morning.  6  . insulin glargine (LANTUS) 100 UNIT/ML injection Inject 12 Units into the skin 2 (two) times daily as needed (for BS of > 180).     [name] Kitchen loperamide (IMODIUM A-D) 2 MG tablet Take 4 mg by mouth 2 (two) times daily.    [name] Kitchen LORazepam (ATIVAN) 1 MG tablet Take 1 mg by mouth every morning.     . metFORMIN (GLUCOPHAGE) 500 MG tablet Take 500 mg by mouth 2 (two) times  daily.  6  . metoprolol tartrate (LOPRESSOR) 25 MG tablet TAKE 1/2 TABLET BY MOUTH DAILY ( TAKE A WHOLE TABLET DAILY IF SBP>150) (Patient taking differently: Take 25 mg by mouth daily. ) 90 tablet 3  . montelukast (SINGULAIR) 10 MG tablet TAKE 1 TABLET (10 MG TOTAL) BY MOUTH DAILY. 90 tablet 1  . nitroGLYCERIN (NITROSTAT) 0.4 MG SL tablet Place 1 tablet (0.4 mg total) under the tongue every 5 (five) minutes as needed for chest pain. 25 tablet 6  . omeprazole (PRILOSEC) 40 MG capsule Take 40 mg by mouth every morning.     [name] Kitchen oxyCODONE (OXY IR/ROXICODONE) 5 MG immediate release tablet Take 1 tablet (5 mg total) by mouth every 8 (eight) hours as needed for severe pain. 60 tablet 0  . predniSONE (DELTASONE) 5 MG tablet Take 5  mg by mouth daily with breakfast.    . promethazine (PHENERGAN) 25 MG tablet Take 25 mg by mouth at bedtime as needed for nausea or vomiting (or sleep).     . rosuvastatin (CRESTOR) 20 MG tablet Take 1 tablet (20 mg total) by mouth daily. 90 tablet 1  . sertraline (ZOLOFT) 100 MG tablet Take 100 mg by mouth every morning.    . metoCLOPramide (REGLAN) 10 MG tablet Take 1 tablet (10 mg total) by mouth every 6 (six) hours as needed for nausea (or headache). 30 tablet 0   No facility-administered medications prior to visit.     ROS Review of Systems  Constitutional: Positive for unexpected weight change. Negative for appetite change, chills, diaphoresis and fatigue.  HENT: Negative.  Negative for trouble swallowing.   Eyes: Negative for visual disturbance.  Respiratory: Negative for cough, chest tightness and wheezing.   Cardiovascular: Negative for chest pain, palpitations and leg swelling.  Gastrointestinal: Negative for abdominal pain, constipation, diarrhea, nausea and vomiting.  Endocrine: Negative for polydipsia, polyphagia and polyuria.  Genitourinary: Negative.  Negative for difficulty urinating, dysuria, frequency, hematuria and urgency.  Musculoskeletal: Negative.  Negative for arthralgias and myalgias.  Skin: Negative.  Negative for color change, pallor and rash.  Neurological: Negative.  Negative for dizziness, weakness and light-headedness.  Hematological: Negative for adenopathy. Does not bruise/bleed easily.  Psychiatric/Behavioral: Negative.  Negative for dysphoric mood and sleep disturbance. The patient is not nervous/anxious.     Objective:  BP 134/70 (BP Location: Right Arm, Patient Position: Sitting, Cuff Size: Large)   Pulse 81   Temp 97.9 F (36.6 C) (Oral)   Ht 5\' 1"  (1.549 m)   Wt 234 lb 12 oz (106.5 kg)   SpO2 95%   BMI 44.36 kg/m   BP Readings from Last 3 Encounters:  11/30/17 134/70  10/19/17 122/80  10/12/17 130/76    Wt Readings from Last 3  Encounters:  11/30/17 234 lb 12 oz (106.5 kg)  10/19/17 235 lb (106.6 kg)  10/12/17 232 lb 3.2 oz (105.3 kg)    Physical Exam  Constitutional: She is oriented to person, place, and time. No distress.  HENT:  Mouth/Throat: Oropharynx is clear and moist. No oropharyngeal exudate.  Eyes: Conjunctivae are normal. No scleral icterus.  Neck: Normal range of motion. Neck supple. No JVD present. No thyromegaly present.  Cardiovascular: Normal rate, regular rhythm, normal heart sounds and intact distal pulses. Exam reveals no gallop and no friction rub.  No murmur heard. Pulmonary/Chest: Effort normal and breath sounds normal. No respiratory distress. She has no wheezes. She has no rales.  Abdominal: Soft. Bowel sounds are normal. She exhibits no  mass. There is no tenderness. There is no guarding.  Musculoskeletal: Normal range of motion. She exhibits no edema, tenderness or deformity.  Lymphadenopathy:    She has no cervical adenopathy.  Neurological: She is alert and oriented to person, place, and time.  Skin: Skin is warm and dry. She is not diaphoretic. No pallor.  Vitals reviewed.   Lab Results  Component Value Date   WBC 8.8 09/21/2017   HGB 12.5 09/21/2017   HCT 37.6 09/21/2017   PLT 175 09/21/2017   GLUCOSE 147 (H) 09/21/2017   CHOL 111 11/30/2017   TRIG 129.0 11/30/2017   HDL 53.90 11/30/2017   LDLCALC 32 11/30/2017   ALT 16 09/21/2017   AST 28 09/21/2017   NA 138 09/21/2017   K 3.9 09/21/2017   CL 101 09/21/2017   CREATININE 0.83 09/21/2017   BUN 12 09/21/2017   CO2 22 09/21/2017   TSH 0.83 11/30/2017   INR 1.08 10/14/2016   HGBA1C 7.2 (H) 11/30/2017   MICROALBUR 30 11/30/2017    Dg Chest 2 View  Result Date: 10/12/2017 CLINICAL DATA:  History of bronchitis, former smoking history EXAM: CHEST  2 VIEW COMPARISON:  CT chest of 06/18/2017 and chest x-ray of 12/28/2016 FINDINGS: Minimal linear scarring in the left mid lung remains. No pneumonia or effusion is seen.  Mediastinal and hilar contours are unremarkable. Mild cardiomegaly is stable. Median sternotomy sutures are present from prior CABG. Coronary artery stents are noted. No acute bony abnormality is seen. IMPRESSION: Stable mild cardiomegaly.  No active lung disease. Electronically Signed   By: Ivar Drape M.D.   On: 10/12/2017 12:57    Assessment & Plan:   Abbeygail was seen today for diabetes.  Diagnoses and all orders for this visit:  Type 2 diabetes mellitus with peripheral neuropathy ([place])- Her A1c is at 7.2%.  Her blood sugars are adequately well controlled on the current regimen.  Will continue without any changes. -     Cancel: Ambulatory referral to Ophthalmology -     Hemoglobin A1c; Future -     Ambulatory referral to Ophthalmology -     POCT UA - Microalbumin  Dyslipidemia, goal LDL below 70- She has achieved her LDL goal and is doing well on the statin. -     Lipid panel; Future -     Thyroid Panel With TSH; Future  Morbid obesity - s/p Lap Band 9/'05- She has been hesitant to have the lap band tightened recently.  She is gaining weight.  She will consider seeing her surgeon to see if the lap band can be tightened to help her lose weight. -     Thyroid Panel With TSH; Future  Need for Tdap vaccination -     Tdap vaccine greater than or equal to 7yo IM  Need for pneumococcal vaccination -     Pneumococcal polysaccharide vaccine 23-valent greater than or equal to 2yo subcutaneous/IM   I have discontinued Darielle L. Stockdale's metoCLOPramide. I am also having her maintain her omeprazole, LORazepam, azaTHIOprine, promethazine, calcitRIOL, nitroGLYCERIN, sertraline, glimepiride, metFORMIN, acetaminophen, ferrous sulfate, insulin glargine, clotrimazole, clopidogrel, diltiazem, albuterol, fluticasone, famotidine, montelukast, DULoxetine, oxyCODONE, budesonide-formoterol, predniSONE, carbamazepine, cyclobenzaprine, rosuvastatin, metoprolol tartrate, and loperamide.  No orders of the  defined types were placed in this encounter.    Follow-up: Return in about 4 months (around 04/01/2018).  Scarlette Calico, MD

## 2017-12-01 ENCOUNTER — Other Ambulatory Visit: Payer: Self-pay

## 2017-12-01 ENCOUNTER — Encounter (HOSPITAL_COMMUNITY): Payer: Self-pay | Admitting: *Deleted

## 2017-12-01 LAB — THYROID PANEL WITH TSH
FREE THYROXINE INDEX: 1.7 (ref 1.4–3.8)
T3 Uptake: 29 % (ref 22–35)
T4, Total: 6 ug/dL (ref 5.1–11.9)
TSH: 0.83 m[IU]/L (ref 0.40–4.50)

## 2017-12-01 NOTE — Progress Notes (Signed)
Spoke with pt for pre-op call. Pt has extensive cardiac history. Has cardiac clearance from Dr. Ellyn Hack. She denies any recent chest pain or sob. Pt has hx of kidney transplant, has clearance from Dr. Jimmy Footman. Pt has COPD and has clearance from Dr. Lamonte Sakai. Pt was instructed to stop Plavix 5-7 days prior to surgery. Her last dose was 11/24/17. Pt is a type 2 diabetic. Last A1C was 7.2 on 11/29/17. Pt states her fasting blood sugar is usually between 146-180. Pt uses Lantus Insulin "as needed". Instructed her to take 1/2 of her regular dose of Lantus Insulin Thursday PM and Friday AM if needed. Instructed pt to check her blood sugar Friday AM when she gets up and every 2 hours until she leaves for the hospital. If blood sugar is 70 or below, treat with 1/2 cup of clear juice (apple or cranberry) and recheck blood sugar 15 minutes after drinking juice. If blood sugar continues to be 70 or below, call the Short Stay department and ask to speak to a nurse. Pt voiced understanding.

## 2017-12-03 ENCOUNTER — Encounter (HOSPITAL_COMMUNITY)
Admission: RE | Disposition: A | Discharge status: Home / Self Care | Payer: Self-pay | Source: Ambulatory Visit | Attending: Orthopedic Surgery

## 2017-12-03 ENCOUNTER — Observation Stay (HOSPITAL_COMMUNITY)
Admission: RE | Admit: 2017-12-03 | Discharge: 2017-12-04 | Disposition: A | Discharge status: Home / Self Care | Payer: Medicare Other | Source: Ambulatory Visit | Attending: Orthopedic Surgery | Admitting: Orthopedic Surgery

## 2017-12-03 ENCOUNTER — Other Ambulatory Visit: Payer: Self-pay

## 2017-12-03 ENCOUNTER — Encounter (HOSPITAL_COMMUNITY): Payer: Self-pay

## 2017-12-03 ENCOUNTER — Ambulatory Visit (HOSPITAL_COMMUNITY): Payer: Medicare Other | Admitting: Emergency Medicine

## 2017-12-03 DIAGNOSIS — S83282A Other tear of lateral meniscus, current injury, left knee, initial encounter: Secondary | ICD-10-CM | POA: Diagnosis not present

## 2017-12-03 DIAGNOSIS — I480 Paroxysmal atrial fibrillation: Secondary | ICD-10-CM | POA: Diagnosis not present

## 2017-12-03 DIAGNOSIS — Z9889 Other specified postprocedural states: Secondary | ICD-10-CM

## 2017-12-03 DIAGNOSIS — Z6841 Body Mass Index (BMI) 40.0 and over, adult: Secondary | ICD-10-CM | POA: Diagnosis not present

## 2017-12-03 DIAGNOSIS — Z882 Allergy status to sulfonamides status: Secondary | ICD-10-CM | POA: Insufficient documentation

## 2017-12-03 DIAGNOSIS — J449 Chronic obstructive pulmonary disease, unspecified: Secondary | ICD-10-CM | POA: Diagnosis not present

## 2017-12-03 DIAGNOSIS — I2510 Atherosclerotic heart disease of native coronary artery without angina pectoris: Secondary | ICD-10-CM | POA: Diagnosis not present

## 2017-12-03 DIAGNOSIS — S83272A Complex tear of lateral meniscus, current injury, left knee, initial encounter: Principal | ICD-10-CM | POA: Insufficient documentation

## 2017-12-03 DIAGNOSIS — Z951 Presence of aortocoronary bypass graft: Secondary | ICD-10-CM | POA: Diagnosis not present

## 2017-12-03 DIAGNOSIS — Z7984 Long term (current) use of oral hypoglycemic drugs: Secondary | ICD-10-CM | POA: Insufficient documentation

## 2017-12-03 DIAGNOSIS — N186 End stage renal disease: Secondary | ICD-10-CM | POA: Insufficient documentation

## 2017-12-03 DIAGNOSIS — Z8673 Personal history of transient ischemic attack (TIA), and cerebral infarction without residual deficits: Secondary | ICD-10-CM | POA: Diagnosis not present

## 2017-12-03 DIAGNOSIS — M1712 Unilateral primary osteoarthritis, left knee: Secondary | ICD-10-CM | POA: Insufficient documentation

## 2017-12-03 DIAGNOSIS — Z881 Allergy status to other antibiotic agents status: Secondary | ICD-10-CM | POA: Insufficient documentation

## 2017-12-03 DIAGNOSIS — I252 Old myocardial infarction: Secondary | ICD-10-CM | POA: Diagnosis not present

## 2017-12-03 DIAGNOSIS — M797 Fibromyalgia: Secondary | ICD-10-CM | POA: Insufficient documentation

## 2017-12-03 DIAGNOSIS — G4733 Obstructive sleep apnea (adult) (pediatric): Secondary | ICD-10-CM | POA: Insufficient documentation

## 2017-12-03 DIAGNOSIS — M069 Rheumatoid arthritis, unspecified: Secondary | ICD-10-CM | POA: Diagnosis not present

## 2017-12-03 DIAGNOSIS — E1122 Type 2 diabetes mellitus with diabetic chronic kidney disease: Secondary | ICD-10-CM | POA: Insufficient documentation

## 2017-12-03 DIAGNOSIS — F418 Other specified anxiety disorders: Secondary | ICD-10-CM | POA: Diagnosis not present

## 2017-12-03 DIAGNOSIS — E785 Hyperlipidemia, unspecified: Secondary | ICD-10-CM | POA: Insufficient documentation

## 2017-12-03 DIAGNOSIS — Y929 Unspecified place or not applicable: Secondary | ICD-10-CM | POA: Diagnosis not present

## 2017-12-03 DIAGNOSIS — K219 Gastro-esophageal reflux disease without esophagitis: Secondary | ICD-10-CM | POA: Insufficient documentation

## 2017-12-03 DIAGNOSIS — Z885 Allergy status to narcotic agent status: Secondary | ICD-10-CM | POA: Insufficient documentation

## 2017-12-03 DIAGNOSIS — I120 Hypertensive chronic kidney disease with stage 5 chronic kidney disease or end stage renal disease: Secondary | ICD-10-CM | POA: Diagnosis not present

## 2017-12-03 DIAGNOSIS — Z87442 Personal history of urinary calculi: Secondary | ICD-10-CM | POA: Diagnosis not present

## 2017-12-03 DIAGNOSIS — Z9884 Bariatric surgery status: Secondary | ICD-10-CM | POA: Insufficient documentation

## 2017-12-03 DIAGNOSIS — Z955 Presence of coronary angioplasty implant and graft: Secondary | ICD-10-CM | POA: Insufficient documentation

## 2017-12-03 DIAGNOSIS — Z940 Kidney transplant status: Secondary | ICD-10-CM | POA: Diagnosis not present

## 2017-12-03 DIAGNOSIS — Z87891 Personal history of nicotine dependence: Secondary | ICD-10-CM | POA: Insufficient documentation

## 2017-12-03 DIAGNOSIS — Z888 Allergy status to other drugs, medicaments and biological substances status: Secondary | ICD-10-CM | POA: Insufficient documentation

## 2017-12-03 DIAGNOSIS — X58XXXA Exposure to other specified factors, initial encounter: Secondary | ICD-10-CM | POA: Diagnosis not present

## 2017-12-03 DIAGNOSIS — Z79899 Other long term (current) drug therapy: Secondary | ICD-10-CM | POA: Insufficient documentation

## 2017-12-03 HISTORY — DX: Diarrhea, unspecified: R19.7

## 2017-12-03 HISTORY — DX: Personal history of urinary calculi: Z87.442

## 2017-12-03 HISTORY — DX: Anemia, unspecified: D64.9

## 2017-12-03 HISTORY — DX: Headache: R51

## 2017-12-03 HISTORY — DX: Pneumonia, unspecified organism: J18.9

## 2017-12-03 HISTORY — PX: KNEE ARTHROSCOPY WITH LATERAL MENISECTOMY: SHX6193

## 2017-12-03 HISTORY — DX: Headache, unspecified: R51.9

## 2017-12-03 LAB — CBC
HCT: 34.5 % — ABNORMAL LOW (ref 36.0–46.0)
HEMOGLOBIN: 11.2 g/dL — AB (ref 12.0–15.0)
MCH: 31.3 pg (ref 26.0–34.0)
MCHC: 32.5 g/dL (ref 30.0–36.0)
MCV: 96.4 fL (ref 78.0–100.0)
PLATELETS: 204 10*3/uL (ref 150–400)
RBC: 3.58 MIL/uL — AB (ref 3.87–5.11)
RDW: 14.9 % (ref 11.5–15.5)
WBC: 6.8 10*3/uL (ref 4.0–10.5)

## 2017-12-03 LAB — GLUCOSE, CAPILLARY
GLUCOSE-CAPILLARY: 186 mg/dL — AB (ref 65–99)
GLUCOSE-CAPILLARY: 297 mg/dL — AB (ref 65–99)
Glucose-Capillary: 173 mg/dL — ABNORMAL HIGH (ref 65–99)
Glucose-Capillary: 195 mg/dL — ABNORMAL HIGH (ref 65–99)

## 2017-12-03 LAB — BASIC METABOLIC PANEL
Anion gap: 14 (ref 5–15)
BUN: 9 mg/dL (ref 6–20)
CO2: 23 mmol/L (ref 22–32)
Calcium: 9.3 mg/dL (ref 8.9–10.3)
Chloride: 104 mmol/L (ref 101–111)
Creatinine, Ser: 0.92 mg/dL (ref 0.44–1.00)
GFR calc Af Amer: >60 mL/min (ref >60)
GFR calc non Af Amer: >60 mL/min (ref >60)
Glucose, Bld: 176 mg/dL — ABNORMAL HIGH (ref 65–99)
Potassium: 3.6 mmol/L (ref 3.5–5.1)
SODIUM: 141 mmol/L (ref 135–145)

## 2017-12-03 SURGERY — ARTHROSCOPY, KNEE, WITH LATERAL MENISCECTOMY
Anesthesia: General | Site: Knee | Laterality: Left

## 2017-12-03 MED ORDER — ONDANSETRON HCL 4 MG/2ML IJ SOLN: 4.0000 mg | Freq: Once | INTRAMUSCULAR | Status: DC | PRN

## 2017-12-03 MED ORDER — CEFAZOLIN SODIUM-DEXTROSE 1-4 GM/50ML-% IV SOLN
1.0000 g | Freq: Four times a day (QID) | INTRAVENOUS | Status: AC
  Administered 2017-12-03 – 2017-12-04 (×3): 1 g via INTRAVENOUS
  Filled 2017-12-03 (×3): qty 50

## 2017-12-03 MED ORDER — PHENYLEPHRINE 40 MCG/ML (10ML) SYRINGE FOR IV PUSH (FOR BLOOD PRESSURE SUPPORT)
PREFILLED_SYRINGE | INTRAVENOUS | Status: DC | PRN
  Administered 2017-12-03 (×3): 80 ug via INTRAVENOUS

## 2017-12-03 MED ORDER — CEFAZOLIN SODIUM-DEXTROSE 2-4 GM/100ML-% IV SOLN
2.0000 g | INTRAVENOUS | Status: AC
  Administered 2017-12-03: 2 g via INTRAVENOUS
  Filled 2017-12-03: qty 100

## 2017-12-03 MED ORDER — CHLORHEXIDINE GLUCONATE 4 % EX LIQD: 60.0000 mL | Freq: Once | CUTANEOUS | Status: DC

## 2017-12-03 MED ORDER — MIDAZOLAM HCL 2 MG/2ML IJ SOLN
INTRAMUSCULAR | Status: AC
  Filled 2017-12-03: qty 2

## 2017-12-03 MED ORDER — ONDANSETRON HCL 4 MG/2ML IJ SOLN: 4.0000 mg | Freq: Four times a day (QID) | INTRAMUSCULAR | Status: DC | PRN

## 2017-12-03 MED ORDER — PREDNISONE 5 MG PO TABS
5.0000 mg | ORAL_TABLET | Freq: Every day | ORAL | Status: DC
  Administered 2017-12-04: 5 mg via ORAL
  Filled 2017-12-03: qty 1

## 2017-12-03 MED ORDER — SODIUM CHLORIDE 0.9 % IR SOLN
Status: DC | PRN
  Administered 2017-12-03 (×2): 3000 mL

## 2017-12-03 MED ORDER — ACETAMINOPHEN 500 MG PO TABS
ORAL_TABLET | ORAL | Status: AC
  Filled 2017-12-03: qty 2

## 2017-12-03 MED ORDER — METOCLOPRAMIDE HCL 5 MG PO TABS: 5.0000 mg | ORAL_TABLET | Freq: Three times a day (TID) | ORAL | Status: DC | PRN

## 2017-12-03 MED ORDER — CYCLOBENZAPRINE HCL 10 MG PO TABS: 10.0000 mg | ORAL_TABLET | Freq: Three times a day (TID) | ORAL | Status: DC | PRN

## 2017-12-03 MED ORDER — LORAZEPAM 1 MG PO TABS
1.0000 mg | ORAL_TABLET | Freq: Every morning | ORAL | Status: DC
  Administered 2017-12-04: 1 mg via ORAL
  Filled 2017-12-03: qty 1

## 2017-12-03 MED ORDER — METHYLPREDNISOLONE ACETATE 80 MG/ML IJ SUSP
INTRAMUSCULAR | Status: DC | PRN
  Administered 2017-12-03: 80 mg

## 2017-12-03 MED ORDER — FENTANYL CITRATE (PF) 100 MCG/2ML IJ SOLN
INTRAMUSCULAR | Status: DC | PRN
  Administered 2017-12-03: 50 ug via INTRAVENOUS
  Administered 2017-12-03 (×2): 25 ug via INTRAVENOUS
  Administered 2017-12-03: 50 ug via INTRAVENOUS
  Administered 2017-12-03: 25 ug via INTRAVENOUS
  Administered 2017-12-03: 50 ug via INTRAVENOUS
  Administered 2017-12-03: 25 ug via INTRAVENOUS

## 2017-12-03 MED ORDER — CARBAMAZEPINE 200 MG PO TABS
200.0000 mg | ORAL_TABLET | Freq: Two times a day (BID) | ORAL | Status: DC
  Administered 2017-12-03 – 2017-12-04 (×2): 200 mg via ORAL
  Filled 2017-12-03 (×2): qty 1

## 2017-12-03 MED ORDER — METHYLPREDNISOLONE ACETATE 80 MG/ML IJ SUSP
INTRAMUSCULAR | Status: AC
  Filled 2017-12-03: qty 1

## 2017-12-03 MED ORDER — LOPERAMIDE HCL 2 MG PO CAPS
4.0000 mg | ORAL_CAPSULE | Freq: Two times a day (BID) | ORAL | Status: DC
  Administered 2017-12-03 – 2017-12-04 (×2): 4 mg via ORAL
  Filled 2017-12-03 (×2): qty 2

## 2017-12-03 MED ORDER — METOPROLOL TARTRATE 25 MG PO TABS
25.0000 mg | ORAL_TABLET | Freq: Every day | ORAL | Status: DC
  Administered 2017-12-03 – 2017-12-04 (×2): 25 mg via ORAL
  Filled 2017-12-03 (×2): qty 1

## 2017-12-03 MED ORDER — ONDANSETRON HCL 4 MG/2ML IJ SOLN
INTRAMUSCULAR | Status: AC
  Filled 2017-12-03: qty 2

## 2017-12-03 MED ORDER — SODIUM CHLORIDE 0.9 % IV SOLN: INTRAVENOUS | Status: DC

## 2017-12-03 MED ORDER — CALCITRIOL 0.25 MCG PO CAPS
0.2500 ug | ORAL_CAPSULE | ORAL | Status: DC
  Administered 2017-12-04: 0.25 ug via ORAL
  Filled 2017-12-03: qty 1

## 2017-12-03 MED ORDER — DULOXETINE HCL 60 MG PO CPEP
60.0000 mg | ORAL_CAPSULE | Freq: Every morning | ORAL | Status: DC
  Administered 2017-12-04: 60 mg via ORAL
  Filled 2017-12-03: qty 1

## 2017-12-03 MED ORDER — LIDOCAINE 2% (20 MG/ML) 5 ML SYRINGE
INTRAMUSCULAR | Status: AC
  Filled 2017-12-03: qty 5

## 2017-12-03 MED ORDER — OXYCODONE HCL 5 MG PO TABS
5.0000 mg | ORAL_TABLET | ORAL | Status: DC | PRN
  Administered 2017-12-03: 10 mg via ORAL
  Administered 2017-12-04: 5 mg via ORAL
  Filled 2017-12-03: qty 2
  Filled 2017-12-03: qty 1
  Filled 2017-12-03: qty 2

## 2017-12-03 MED ORDER — FENTANYL CITRATE (PF) 100 MCG/2ML IJ SOLN: 25.0000 ug | INTRAMUSCULAR | Status: DC | PRN

## 2017-12-03 MED ORDER — ERYTHROMYCIN BASE 250 MG PO TABS
500.0000 mg | ORAL_TABLET | Freq: Every day | ORAL | Status: DC
  Administered 2017-12-03 – 2017-12-04 (×2): 500 mg via ORAL
  Filled 2017-12-03 (×2): qty 2

## 2017-12-03 MED ORDER — DILTIAZEM HCL ER COATED BEADS 120 MG PO CP24
120.0000 mg | ORAL_CAPSULE | Freq: Every day | ORAL | Status: DC
  Administered 2017-12-04: 120 mg via ORAL
  Filled 2017-12-03: qty 1

## 2017-12-03 MED ORDER — LACTATED RINGERS IV SOLN
INTRAVENOUS | Status: DC
  Administered 2017-12-03: 09:00:00 via INTRAVENOUS

## 2017-12-03 MED ORDER — MOMETASONE FURO-FORMOTEROL FUM 200-5 MCG/ACT IN AERO
2.0000 | INHALATION_SPRAY | Freq: Two times a day (BID) | RESPIRATORY_TRACT | Status: DC
  Filled 2017-12-03: qty 8.8

## 2017-12-03 MED ORDER — DEXAMETHASONE SODIUM PHOSPHATE 10 MG/ML IJ SOLN
INTRAMUSCULAR | Status: AC
  Filled 2017-12-03: qty 1

## 2017-12-03 MED ORDER — BUPIVACAINE-EPINEPHRINE 0.5% -1:200000 IJ SOLN
INTRAMUSCULAR | Status: DC | PRN
  Administered 2017-12-03: 30 mL

## 2017-12-03 MED ORDER — MONTELUKAST SODIUM 10 MG PO TABS
10.0000 mg | ORAL_TABLET | Freq: Every day | ORAL | Status: DC
  Administered 2017-12-03: 10 mg via ORAL
  Filled 2017-12-03: qty 1

## 2017-12-03 MED ORDER — PHENYLEPHRINE 40 MCG/ML (10ML) SYRINGE FOR IV PUSH (FOR BLOOD PRESSURE SUPPORT)
PREFILLED_SYRINGE | INTRAVENOUS | Status: AC
  Filled 2017-12-03: qty 10

## 2017-12-03 MED ORDER — NITROGLYCERIN 0.4 MG SL SUBL: 0.4000 mg | SUBLINGUAL_TABLET | SUBLINGUAL | Status: DC | PRN

## 2017-12-03 MED ORDER — INSULIN ASPART 100 UNIT/ML ~~LOC~~ SOLN
6.0000 [IU] | Freq: Three times a day (TID) | SUBCUTANEOUS | Status: DC
  Administered 2017-12-03 – 2017-12-04 (×3): 6 [IU] via SUBCUTANEOUS

## 2017-12-03 MED ORDER — FLUTICASONE PROPIONATE 50 MCG/ACT NA SUSP
2.0000 | Freq: Two times a day (BID) | NASAL | Status: DC | PRN
  Filled 2017-12-03: qty 16

## 2017-12-03 MED ORDER — PROPOFOL 10 MG/ML IV BOLUS
INTRAVENOUS | Status: DC | PRN
  Administered 2017-12-03: 200 mg via INTRAVENOUS

## 2017-12-03 MED ORDER — FAMOTIDINE 20 MG PO TABS
20.0000 mg | ORAL_TABLET | Freq: Every day | ORAL | Status: DC
  Administered 2017-12-04: 20 mg via ORAL
  Filled 2017-12-03: qty 1

## 2017-12-03 MED ORDER — FENTANYL CITRATE (PF) 250 MCG/5ML IJ SOLN
INTRAMUSCULAR | Status: AC
  Filled 2017-12-03: qty 5

## 2017-12-03 MED ORDER — PROMETHAZINE HCL 25 MG PO TABS
25.0000 mg | ORAL_TABLET | Freq: Every evening | ORAL | Status: DC | PRN
  Administered 2017-12-03: 25 mg via ORAL
  Filled 2017-12-03: qty 1

## 2017-12-03 MED ORDER — INSULIN ASPART 100 UNIT/ML ~~LOC~~ SOLN: 0.0000 [IU] | Freq: Every day | SUBCUTANEOUS | Status: DC

## 2017-12-03 MED ORDER — INSULIN ASPART 100 UNIT/ML ~~LOC~~ SOLN
0.0000 [IU] | Freq: Three times a day (TID) | SUBCUTANEOUS | Status: DC
Start: 2017-12-03 — End: 2017-12-04
  Administered 2017-12-03: 11 [IU] via SUBCUTANEOUS
  Administered 2017-12-04 (×2): 4 [IU] via SUBCUTANEOUS

## 2017-12-03 MED ORDER — ALBUTEROL SULFATE (2.5 MG/3ML) 0.083% IN NEBU: 2.5000 mg | INHALATION_SOLUTION | Freq: Four times a day (QID) | RESPIRATORY_TRACT | Status: DC | PRN

## 2017-12-03 MED ORDER — DEXAMETHASONE SODIUM PHOSPHATE 10 MG/ML IJ SOLN
INTRAMUSCULAR | Status: DC | PRN
  Administered 2017-12-03: 5 mg via INTRAVENOUS

## 2017-12-03 MED ORDER — PROPOFOL 10 MG/ML IV BOLUS
INTRAVENOUS | Status: AC
  Filled 2017-12-03: qty 20

## 2017-12-03 MED ORDER — PANTOPRAZOLE SODIUM 40 MG PO TBEC
80.0000 mg | DELAYED_RELEASE_TABLET | Freq: Every day | ORAL | Status: DC
  Administered 2017-12-03 – 2017-12-04 (×2): 80 mg via ORAL
  Filled 2017-12-03 (×2): qty 2

## 2017-12-03 MED ORDER — ONDANSETRON HCL 4 MG PO TABS: 4.0000 mg | ORAL_TABLET | Freq: Four times a day (QID) | ORAL | Status: DC | PRN

## 2017-12-03 MED ORDER — LIDOCAINE 2% (20 MG/ML) 5 ML SYRINGE
INTRAMUSCULAR | Status: DC | PRN
  Administered 2017-12-03: 40 mg via INTRAVENOUS

## 2017-12-03 MED ORDER — CLOPIDOGREL BISULFATE 75 MG PO TABS
75.0000 mg | ORAL_TABLET | Freq: Every day | ORAL | Status: DC
  Administered 2017-12-04: 75 mg via ORAL
  Filled 2017-12-03: qty 1

## 2017-12-03 MED ORDER — ONDANSETRON HCL 4 MG/2ML IJ SOLN
INTRAMUSCULAR | Status: DC | PRN
  Administered 2017-12-03: 4 mg via INTRAVENOUS

## 2017-12-03 MED ORDER — DOCUSATE SODIUM 100 MG PO CAPS
100.0000 mg | ORAL_CAPSULE | Freq: Two times a day (BID) | ORAL | Status: DC
  Filled 2017-12-03: qty 1

## 2017-12-03 MED ORDER — BUPIVACAINE-EPINEPHRINE (PF) 0.5% -1:200000 IJ SOLN
INTRAMUSCULAR | Status: AC
  Filled 2017-12-03: qty 30

## 2017-12-03 MED ORDER — ACETAMINOPHEN 500 MG PO TABS
1000.0000 mg | ORAL_TABLET | Freq: Four times a day (QID) | ORAL | Status: DC | PRN
Start: 2017-12-03 — End: 2017-12-04
  Administered 2017-12-03 – 2017-12-04 (×4): 1000 mg via ORAL
  Filled 2017-12-03 (×4): qty 2

## 2017-12-03 MED ORDER — ROSUVASTATIN CALCIUM 10 MG PO TABS
20.0000 mg | ORAL_TABLET | Freq: Every day | ORAL | Status: DC
  Administered 2017-12-03 – 2017-12-04 (×2): 20 mg via ORAL
  Filled 2017-12-03 (×2): qty 2

## 2017-12-03 MED ORDER — METOCLOPRAMIDE HCL 5 MG/ML IJ SOLN: 5.0000 mg | Freq: Three times a day (TID) | INTRAMUSCULAR | Status: DC | PRN

## 2017-12-03 MED ORDER — AZATHIOPRINE 50 MG PO TABS
125.0000 mg | ORAL_TABLET | Freq: Every evening | ORAL | Status: DC
  Administered 2017-12-03: 125 mg via ORAL
  Filled 2017-12-03 (×2): qty 3

## 2017-12-03 MED ORDER — SODIUM CHLORIDE 0.9 % IV SOLN
INTRAVENOUS | Status: DC
  Administered 2017-12-03: 15:00:00 via INTRAVENOUS

## 2017-12-03 MED ORDER — MIDAZOLAM HCL 5 MG/5ML IJ SOLN
INTRAMUSCULAR | Status: DC | PRN
  Administered 2017-12-03: 2 mg via INTRAVENOUS

## 2017-12-03 SURGICAL SUPPLY — 51 items
BANDAGE ACE 4X5 VEL STRL LF (GAUZE/BANDAGES/DRESSINGS) ×2 IMPLANT
BANDAGE ACE 6X5 VEL STRL LF (GAUZE/BANDAGES/DRESSINGS) IMPLANT
BLADE CLIPPER SURG (BLADE) IMPLANT
BLADE CUDA 4.2 (BLADE) ×3 IMPLANT
BLADE GREAT WHITE 4.2 (BLADE) IMPLANT
BLADE GREAT WHITE 4.2MM (BLADE)
BLADE SURG 11 STRL SS (BLADE) IMPLANT
BNDG GAUZE ELAST 4 BULKY (GAUZE/BANDAGES/DRESSINGS) ×3 IMPLANT
COVER SURGICAL LIGHT HANDLE (MISCELLANEOUS) ×3 IMPLANT
DRAPE ARTHROSCOPY W/POUCH 114 (DRAPES) ×3 IMPLANT
DRAPE U-SHAPE 47X51 STRL (DRAPES) ×3 IMPLANT
DRSG ADAPTIC 3X8 NADH LF (GAUZE/BANDAGES/DRESSINGS) ×2 IMPLANT
DRSG PAD ABDOMINAL 8X10 ST (GAUZE/BANDAGES/DRESSINGS) ×4 IMPLANT
DURAPREP 26ML APPLICATOR (WOUND CARE) ×3 IMPLANT
FILTER STRAW FLUID ASPIR (MISCELLANEOUS) ×3 IMPLANT
GAUZE SPONGE 4X4 12PLY STRL (GAUZE/BANDAGES/DRESSINGS) ×2 IMPLANT
GAUZE XEROFORM 1X8 LF (GAUZE/BANDAGES/DRESSINGS) IMPLANT
GLOVE BIOGEL PI IND STRL 8 (GLOVE) ×2 IMPLANT
GLOVE BIOGEL PI INDICATOR 8 (GLOVE) ×4
GLOVE ORTHO TXT STRL SZ7.5 (GLOVE) ×6 IMPLANT
GLOVE SURG ORTHO 8.0 STRL STRW (GLOVE) ×6 IMPLANT
GOWN STRL REUS W/ TWL LRG LVL3 (GOWN DISPOSABLE) ×2 IMPLANT
GOWN STRL REUS W/ TWL XL LVL3 (GOWN DISPOSABLE) ×1 IMPLANT
GOWN STRL REUS W/TWL LRG LVL3 (GOWN DISPOSABLE) ×6
GOWN STRL REUS W/TWL XL LVL3 (GOWN DISPOSABLE) ×3
KIT TURNOVER KIT B (KITS) ×3 IMPLANT
MANIFOLD NEPTUNE II (INSTRUMENTS) IMPLANT
NDL 18GX1X1/2 (RX/OR ONLY) (NEEDLE) IMPLANT
NDL HYPO 25GX1X1/2 BEV (NEEDLE) IMPLANT
NDL SPNL 18GX3.5 QUINCKE PK (NEEDLE) IMPLANT
NEEDLE 18GX1X1/2 (RX/OR ONLY) (NEEDLE) IMPLANT
NEEDLE HYPO 25GX1X1/2 BEV (NEEDLE) IMPLANT
NEEDLE SPNL 18GX3.5 QUINCKE PK (NEEDLE) IMPLANT
NS IRRIG 1000ML POUR BTL (IV SOLUTION) IMPLANT
PACK ARTHROSCOPY DSU (CUSTOM PROCEDURE TRAY) ×3 IMPLANT
PAD ARMBOARD 7.5X6 YLW CONV (MISCELLANEOUS) ×6 IMPLANT
PAD CAST 4YDX4 CTTN HI CHSV (CAST SUPPLIES) IMPLANT
PADDING CAST COTTON 4X4 STRL (CAST SUPPLIES) ×3
PADDING CAST COTTON 6X4 STRL (CAST SUPPLIES) ×2 IMPLANT
PROBE BIPOLAR ATHRO 135MM 90D (MISCELLANEOUS) ×2 IMPLANT
SET ARTHROSCOPY TUBING (MISCELLANEOUS) ×3
SET ARTHROSCOPY TUBING LN (MISCELLANEOUS) ×1 IMPLANT
SPONGE LAP 4X18 X RAY DECT (DISPOSABLE) ×3 IMPLANT
SUT ETHILON 3 0 PS 1 (SUTURE) ×5 IMPLANT
SYR 20ML ECCENTRIC (SYRINGE) IMPLANT
SYR CONTROL 10ML LL (SYRINGE) IMPLANT
TOWEL OR 17X24 6PK STRL BLUE (TOWEL DISPOSABLE) ×3 IMPLANT
TOWEL OR 17X26 10 PK STRL BLUE (TOWEL DISPOSABLE) ×3 IMPLANT
TUBE CONNECTING 12'X1/4 (SUCTIONS) ×1
TUBE CONNECTING 12X1/4 (SUCTIONS) ×2 IMPLANT
WATER STERILE IRR 1000ML POUR (IV SOLUTION) ×3 IMPLANT

## 2017-12-03 NOTE — Interval H&P Note (Signed)
History and Physical Interval Note:  12/03/2017 9:56 AM  Katherine [name]  has presented today for surgery, with the diagnosis of LEFT KNEE MENISCUS TEAR  The various methods of treatment have been discussed with the patient and family. After consideration of risks, benefits and other options for treatment, the patient has consented to  Procedure(s): LEFT KNEE ARTHROSCOPY WITH LATERAL MENISECTOMY (Left) as a surgical intervention .  The patient's history has been reviewed, patient examined, no change in status, stable for surgery.  I have reviewed the patient's chart and labs.  Questions were answered to the patient's satisfaction.     Yvette Rack

## 2017-12-03 NOTE — Op Note (Signed)
Katherine [name], Katherine [name]              ACCOUNT NO.:  [id number]  MEDICAL RECORD NO.:  [id number]  LOCATION:  [facility]                         FACILITY:  [facility]  PHYSICIAN:  Lockie Pares, M.D.    DATE OF BIRTH:  04/15/1956  DATE OF PROCEDURE:  12/03/2017 DATE OF DISCHARGE:                              OPERATIVE REPORT   PREOPERATIVE DIAGNOSES: 1. Grade 3 osteoarthritis, patellofemoral joint, medial and lateral     compartments. 2. Complex tear, anterior middle horn, lateral meniscus.  POSTOPERATIVE DIAGNOSES: 1. Grade 3 osteoarthritis, patellofemoral joint, medial and lateral     compartments. 2. Complex tear, anterior middle horn, lateral meniscus.  OPERATIONS: 1. Partial lateral meniscectomy. 2. Debridement chondroplasty for the left knee.  SURGEON:  Lockie Pares, M.D.  ANESTHESIA:  General with local supplementation.  DESCRIPTION OF PROCEDURE:  Supine position, no tourniquet. Inferomedial, inferolateral portals created.  Systematic inspection of the knee showed mild to moderate grade 2, early grade 3 chondral damage to the undersurface of the patella.  Relative sparing of the trochlear groove with the exception of the central trochlear groove showed grade 3 changes, which were debrided.  Medial compartment did not show meniscus tear.  There was generalized grade 3 chondromalacia of the weightbearing surface of the femur with somewhat less involvement on the tibia, but no grade 4 changes appreciated.  The lateral side of the knee did show more advanced grade 3 changes approaching, but not at grade 4, particularly on the tibia, but also on the femur.  There was complex tear at the junction of anterior and middle horn of the lateral meniscus, resected most of that.  We did leave some rim of meniscus posteriorly and gently tapered the meniscus as the posterior border of the popliteal hiatus was not involved. Chondroplasty was carried out as well on the lateral side of the knee.   Portals closed with nylon.  Knee was infiltrated with Marcaine 0.5% with epinephrine into the subcutaneous tissues around the portals with the addition of 80 mg of the Depo-Medrol intra-articularly. Taken to the recovery room in stable condition.     Lockie Pares, M.D.     WDC/MEDQ  D:  12/03/2017  T:  12/03/2017  Job:  [id number]

## 2017-12-03 NOTE — Transfer of Care (Signed)
Immediate Anesthesia Transfer of Care Note  Patient: Katherine [name]  Procedure(s) Performed: LEFT KNEE ARTHROSCOPY WITH LATERAL MENISECTOMY (Left Knee)  Patient Location: PACU  Anesthesia Type:General  Level of Consciousness: drowsy and patient cooperative  Airway & Oxygen Therapy: Patient Spontanous Breathing and Patient connected to face mask oxygen  Post-op Assessment: Report given to RN and Post -op Vital signs reviewed and stable  Post vital signs: Reviewed and stable  Last Vitals:  Vitals Value Taken Time  BP 129/70 12/03/2017 11:22 AM  Temp    Pulse 88 12/03/2017 11:23 AM  Resp 13 12/03/2017 11:23 AM  SpO2 89 % 12/03/2017 11:23 AM  Vitals shown include unvalidated device data.  Last Pain:  Vitals:   12/03/17 0837  PainSc: 6       Patients Stated Pain Goal: 3 (19/75/88 3254)  Complications: No apparent anesthesia complications

## 2017-12-03 NOTE — Anesthesia Preprocedure Evaluation (Addendum)
Anesthesia Evaluation  Patient identified by MRN, date of birth, ID band Patient awake    Reviewed: Allergy & Precautions, NPO status , Patient's Chart, lab work & pertinent test results, reviewed documented beta blocker date and time   Airway Mallampati: II  TM Distance: >3 FB Neck ROM: Full    Dental  (+) Dental Advisory Given   Pulmonary asthma , sleep apnea , COPD, former smoker,    breath sounds clear to auscultation       Cardiovascular hypertension, Pt. on medications and Pt. on home beta blockers + CAD, + Cardiac Stents, + CABG and + Peripheral Vascular Disease   Rhythm:Regular Rate:Normal     Neuro/Psych  Headaches, Anxiety Depression  Neuromuscular disease CVA    GI/Hepatic Neg liver ROS, GERD  ,  Endo/Other  diabetes, Type 2Morbid obesity  Renal/GU Renal disease     Musculoskeletal  (+) Arthritis , Fibromyalgia -  Abdominal   Peds  Hematology  (+) anemia ,   Anesthesia Other Findings   Reproductive/Obstetrics                            Lab Results  Component Value Date   WBC 6.8 12/03/2017   HGB 11.2 (L) 12/03/2017   HCT 34.5 (L) 12/03/2017   MCV 96.4 12/03/2017   PLT 204 12/03/2017   Lab Results  Component Value Date   CREATININE 0.83 09/21/2017   BUN 12 09/21/2017   NA 138 09/21/2017   K 3.9 09/21/2017   CL 101 09/21/2017   CO2 22 09/21/2017    Anesthesia Physical Anesthesia Plan  ASA: III  Anesthesia Plan: General   Post-op Pain Management:    Induction: Intravenous  PONV Risk Score and Plan: 3 and Ondansetron, Dexamethasone and Treatment may vary due to age or medical condition  Airway Management Planned: LMA  Additional Equipment:   Intra-op Plan:   Post-operative Plan: Extubation in OR  Informed Consent: I have reviewed the patients History and Physical, chart, labs and discussed the procedure including the risks, benefits and alternatives for  the proposed anesthesia with the patient or authorized representative who has indicated his/her understanding and acceptance.   Dental advisory given  Plan Discussed with: CRNA  Anesthesia Plan Comments:        Anesthesia Quick Evaluation

## 2017-12-03 NOTE — Anesthesia Postprocedure Evaluation (Signed)
Anesthesia Post Note  Patient: Katherine [name]  Procedure(s) Performed: LEFT KNEE ARTHROSCOPY WITH LATERAL MENISECTOMY (Left Knee)     Patient location during evaluation: PACU Anesthesia Type: General Level of consciousness: awake and alert Pain management: pain level controlled Vital Signs Assessment: post-procedure vital signs reviewed and stable Respiratory status: spontaneous breathing, nonlabored ventilation, respiratory function stable and patient connected to nasal cannula oxygen Cardiovascular status: blood pressure returned to baseline and stable Postop Assessment: no apparent nausea or vomiting Anesthetic complications: no    Last Vitals:  Vitals:   12/03/17 1237 12/03/17 1245  BP: (!) 145/75   Pulse:  93  Resp:  18  Temp:    SpO2:  98%    Last Pain:  Vitals:   12/03/17 1243  PainSc: 0-No pain                 [name]

## 2017-12-03 NOTE — Progress Notes (Signed)
Spouse at bedside-4 rings & dentures returned to pt. He is leaving & I will call him when a bed is assigned.

## 2017-12-03 NOTE — Anesthesia Procedure Notes (Signed)
Procedure Name: LMA Insertion Date/Time: 12/03/2017 10:27 AM Performed by: Genelle Bal, CRNA Pre-anesthesia Checklist: Patient identified, Emergency Drugs available, Suction available and Patient being monitored Patient Re-evaluated:Patient Re-evaluated prior to induction Oxygen Delivery Method: Circle system utilized Preoxygenation: Pre-oxygenation with 100% oxygen Induction Type: IV induction Ventilation: Mask ventilation without difficulty LMA: LMA inserted LMA Size: 5.0 Number of attempts: 1 Airway Equipment and Method: Bite block Placement Confirmation: positive ETCO2 Tube secured with: Tape Dental Injury: Teeth and Oropharynx as per pre-operative assessment

## 2017-12-03 NOTE — Brief Op Note (Signed)
12/03/2017  9:22 AM  PATIENT:  Katherine [name]  62 y.o. female  PRE-OPERATIVE DIAGNOSIS:  LEFT KNEE MENISCUS TEAR  POST-OPERATIVE DIAGNOSIS: same  PROCEDURE:  Procedure(s): LEFT KNEE ARTHROSCOPY WITH LATERAL MENISECTOMY (Left)  SURGEON:  Surgeon(s) and Role:    * Earlie Server, MD - Primary  PHYSICIAN ASSISTANT:   ASSISTANTS: none   ANESTHESIA:   general  EBL:  minimal  BLOOD ADMINISTERED:none  DRAINS: none   LOCAL MEDICATIONS USED:  NONE  SPECIMEN:  No Specimen  DISPOSITION OF SPECIMEN:  N/A  COUNTS:  YES  TOURNIQUET:  * No tourniquets in log *  DICTATION: .Other Dictation: Dictation Number unknown  PLAN OF CARE: Admit for overnight observation  PATIENT DISPOSITION:  PACU - hemodynamically stable.   Delay start of Pharmacological VTE agent (>24hrs) due to surgical blood loss or risk of bleeding: not applicable

## 2017-12-04 ENCOUNTER — Encounter (HOSPITAL_COMMUNITY): Payer: Self-pay | Admitting: Orthopedic Surgery

## 2017-12-04 DIAGNOSIS — S83272A Complex tear of lateral meniscus, current injury, left knee, initial encounter: Secondary | ICD-10-CM | POA: Diagnosis not present

## 2017-12-04 DIAGNOSIS — N186 End stage renal disease: Secondary | ICD-10-CM | POA: Diagnosis not present

## 2017-12-04 DIAGNOSIS — E1122 Type 2 diabetes mellitus with diabetic chronic kidney disease: Secondary | ICD-10-CM | POA: Diagnosis not present

## 2017-12-04 DIAGNOSIS — I120 Hypertensive chronic kidney disease with stage 5 chronic kidney disease or end stage renal disease: Secondary | ICD-10-CM | POA: Diagnosis not present

## 2017-12-04 DIAGNOSIS — I2510 Atherosclerotic heart disease of native coronary artery without angina pectoris: Secondary | ICD-10-CM | POA: Diagnosis not present

## 2017-12-04 DIAGNOSIS — M1712 Unilateral primary osteoarthritis, left knee: Secondary | ICD-10-CM | POA: Diagnosis not present

## 2017-12-04 LAB — GLUCOSE, CAPILLARY
Glucose-Capillary: 158 mg/dL — ABNORMAL HIGH (ref 65–99)
Glucose-Capillary: 199 mg/dL — ABNORMAL HIGH (ref 65–99)

## 2017-12-04 NOTE — Progress Notes (Signed)
Katherine [name] to be D/C'd Home per MD order.  Discussed prescriptions and follow up appointments with the patient. Prescriptions given to patient, medication list explained in detail. Pt verbalized understanding.  Allergies as of 12/04/2017      Reactions   Tetracycline Hives   Hydromorphone Hcl Nausea And Vomiting   Niacin Other (See Comments)   Mouth blisters   Niaspan [niacin Er] Other (See Comments)   Mouth blisters   Sulfa Antibiotics Nausea Only, Other (See Comments)   "Tears up stomach"   Sulfonamide Derivatives Other (See Comments)   Reaction: per patient "tears her stomach up"   Codeine Nausea And Vomiting   Erythromycin Nausea And Vomiting   Hydromorphone Hcl Nausea And Vomiting   Morphine And Related Nausea And Vomiting   Nalbuphine Nausea And Vomiting   Sulfasalazine Nausea Only, Other (See Comments)   per patient "tears her stomach up", "Tears up stomach"   Tape Rash, Other (See Comments)   No "plastic" tape," please      Medication List    TAKE these medications   acetaminophen 500 MG tablet Commonly known as:  TYLENOL Take 1 tablet (500 mg total) by mouth every 6 (six) hours as needed for moderate pain or headache. What changed:  how much to take   albuterol 108 (90 Base) MCG/ACT inhaler Commonly known as:  VENTOLIN HFA INHALE 2 PUFFS INTO THE LUNGS EVERY 6 (SIX) HOURS AS NEEDED FOR WHEEZING OR SHORTNESS OF BREATH.   azaTHIOprine 50 MG tablet Commonly known as:  IMURAN Take 125 mg by mouth every evening.   budesonide-formoterol 160-4.5 MCG/ACT inhaler Commonly known as:  SYMBICORT Inhale 2 puffs into the lungs 2 (two) times daily.   calcitRIOL 0.25 MCG capsule Commonly known as:  ROCALTROL Take 0.25 mcg by mouth every 3 (three) days.   carbamazepine 200 MG tablet Commonly known as:  TEGRETOL TAKE 1 TABLET BY MOUTH TWICE A DAY What changed:    how much to take  how to take this  when to take this   clopidogrel 75 MG tablet Commonly known  as:  PLAVIX Take 1 tablet (75 mg total) by mouth daily.   clotrimazole 10 MG troche Commonly known as:  MYCELEX Take 1 tablet (10 mg total) by mouth 5 (five) times daily. What changed:    when to take this  reasons to take this   cyclobenzaprine 10 MG tablet Commonly known as:  FLEXERIL TAKE 1 TABLET BY MOUTH 3 TIMES DAILY AS NEEDED FOR MUSCLE SPASMS What changed:  See the new instructions.   diltiazem 120 MG 24 hr capsule Commonly known as:  CARDIZEM CD TAKE 1 CAPSULE (120 MG TOTAL) BY MOUTH DAILY. NEED OV.   DULoxetine 30 MG capsule Commonly known as:  CYMBALTA TAKE 2 CAPSULES (60 MG TOTAL) BY MOUTH EVERY MORNING.   erythromycin base 500 MG tablet Commonly known as:  E-MYCIN Take 500 mg by mouth daily.   famotidine 20 MG tablet Commonly known as:  PEPCID Take 20 mg by mouth daily.   ferrous sulfate 325 (65 FE) MG tablet Take 1 tablet (325 mg total) by mouth 3 (three) times daily with meals. What changed:  when to take this   fluticasone 50 MCG/ACT nasal spray Commonly known as:  FLONASE PLACE 2 SPRAYS INTO THE NOSE DAILY What changed:    how much to take  how to take this  when to take this  additional instructions   glimepiride 4 MG tablet Commonly known as:  AMARYL Take 4 mg by mouth every morning.   LANTUS 100 UNIT/ML injection Generic drug:  insulin glargine Inject 12 Units into the skin 2 (two) times daily as needed (for BS of > 180).   loperamide 2 MG tablet Commonly known as:  IMODIUM A-D Take 4 mg by mouth 2 (two) times daily.   LORazepam 1 MG tablet Commonly known as:  ATIVAN Take 1 mg by mouth every morning.   metFORMIN 500 MG tablet Commonly known as:  GLUCOPHAGE Take 500 mg by mouth 2 (two) times daily.   metoprolol tartrate 25 MG tablet Commonly known as:  LOPRESSOR TAKE 1/2 TABLET BY MOUTH DAILY ( TAKE A WHOLE TABLET DAILY IF SBP>150) What changed:    how much to take  how to take this  when to take this  additional  instructions   montelukast 10 MG tablet Commonly known as:  SINGULAIR TAKE 1 TABLET (10 MG TOTAL) BY MOUTH DAILY.   nitroGLYCERIN 0.4 MG SL tablet Commonly known as:  NITROSTAT Place 1 tablet (0.4 mg total) under the tongue every 5 (five) minutes as needed for chest pain.   omeprazole 40 MG capsule Commonly known as:  PRILOSEC Take 40 mg by mouth every morning.   oxyCODONE 5 MG immediate release tablet Commonly known as:  Oxy IR/ROXICODONE Take 1 tablet (5 mg total) by mouth every 8 (eight) hours as needed for severe pain.   predniSONE 5 MG tablet Commonly known as:  DELTASONE Take 5 mg by mouth daily with breakfast.   promethazine 25 MG tablet Commonly known as:  PHENERGAN Take 25 mg by mouth at bedtime as needed for nausea or vomiting (or sleep).   rosuvastatin 20 MG tablet Commonly known as:  CRESTOR Take 1 tablet (20 mg total) by mouth daily.   sertraline 100 MG tablet Commonly known as:  ZOLOFT Take 100 mg by mouth every morning.       Vitals:   12/04/17 0323 12/04/17 0700  BP: (!) 174/89 (!) 160/87  Pulse: 76 79  Resp:  16  Temp: 98.3 F (36.8 C) 98.1 F (36.7 C)  SpO2: 100% 98%    Skin clean, dry and intact without evidence of skin break down, no evidence of skin tears noted. Left knee has ACE wrap applied, clean, dry, and intact.  IV catheter discontinued intact. Site without signs and symptoms of complications. Dressing and pressure applied. Pt denies pain at this time. No complaints noted.  An After Visit Summary was printed and given to the patient. Patient escorted via [place], and D/C home via private auto.  [place] RN

## 2017-12-06 ENCOUNTER — Ambulatory Visit: Payer: Medicare Other

## 2017-12-06 DIAGNOSIS — Z539 Procedure and treatment not carried out, unspecified reason: Secondary | ICD-10-CM | POA: Insufficient documentation

## 2017-12-06 DIAGNOSIS — M25361 Other instability, right knee: Secondary | ICD-10-CM | POA: Insufficient documentation

## 2017-12-08 ENCOUNTER — Ambulatory Visit: Payer: Medicare Other

## 2017-12-09 ENCOUNTER — Other Ambulatory Visit: Payer: Self-pay | Admitting: Physical Medicine & Rehabilitation

## 2017-12-09 DIAGNOSIS — M47816 Spondylosis without myelopathy or radiculopathy, lumbar region: Secondary | ICD-10-CM

## 2017-12-09 DIAGNOSIS — M797 Fibromyalgia: Secondary | ICD-10-CM

## 2017-12-13 ENCOUNTER — Other Ambulatory Visit: Payer: Self-pay | Admitting: Emergency Medicine

## 2017-12-16 DIAGNOSIS — S83282D Other tear of lateral meniscus, current injury, left knee, subsequent encounter: Secondary | ICD-10-CM | POA: Diagnosis not present

## 2017-12-21 NOTE — Discharge Summary (Signed)
PATIENT ID: Katherine [name]        MRN:  [id number]          DOB/AGE: 03-17-1956 / 62 y.o.    DISCHARGE SUMMARY  ADMISSION DATE:    12/03/2017 DISCHARGE DATE:   12/04/17  ADMISSION DIAGNOSIS: LEFT KNEE MENISCUS TEAR    DISCHARGE DIAGNOSIS:  LEFT KNEE MENISCUS TEAR    ADDITIONAL DIAGNOSIS: Active Problems:   Status post arthroscopy of knee  Past Medical History:  Diagnosis Date  . Anemia   . Anxiety   . Bilateral carotid artery stenosis    Carotid duplex 11/2704: 2-37% LICA, 62-83% RICA, >15% RECA, f/u 1 yr suggested  . CAD (coronary artery disease) of bypass graft 5/01; 3/'02, 8/'03, 10/'04; 1/15   PCI x 5 to SVG-D1   . CAD in native artery 07/1993    3 Vessel Disease (LAD-D1 & RCA) -- CABG  . CAD S/P percutaneous coronary angioplasty    PCI to SVG-D1 insertion/native D1 x 4 = '01 -(S660 BMS 2.5 x 9 - insertion into D1; '02 - distal overlap ACS Pixel 2.5 x 8  BMS; '03 distal/native ISR/Thrombosis - Pixel 2.5 x 13; '04 - ISR-  Taxus 2.5 x 20 (covered all);; 1/15 - mid SVG-D1 (50% distal ISR) - Promus P 2.75 x 20 -- 2.8 mm  . COPD mixed type ([place])    Followed by Dr. Lamonte Sakai "pulmonologist said no COPD"  . Depression   . Depression with anxiety   . Diabetes mellitus type 2 in obese ([place])   . Diarrhea    started after cholecystectomy and mass removed from intestine  . Dyslipidemia, goal LDL below 70    08/2012: TC 137, TG 200, HDL 32!, LDL 45; on statin (followed by Dr.Deterding)  . ESRD (end stage renal disease) ([place]) 1991   s/p Cadaveric Renal Transplant [facility] - Dr. Jimmy Footman)   . Family history of adverse reaction to anesthesia    mom's bp dropped during/after anesthesia  . Fibromyalgia   . GERD (gastroesophageal reflux disease)   . Glomerulonephritis, chronic, rapidly progressive 58  . 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 07/1993   Rescue PTCA of RCA -- referred for CABG.  . H/O: GI bleed   . Headache    migraines in the past  .  History of kidney stones   . Hypertension associated with diabetes ([place])   . MRSA (methicillin resistant staph aureus) culture positive   . Obesity    s/p Lap Band 04/2004- Port Replacement 10/09 & 2/10 for infection; Dr. Excell Seltzer.  . OSA (obstructive sleep apnea)    no longer on CPAP or home O2, states she doesn't need now after lap band  . PAD (peripheral artery disease) ([place]) 08/2013   LEA Dopplers to be read by Dr. Fletcher Anon  . PAF (paroxysmal atrial fibrillation) ([place]) 06/2014   Noted on CardioNet Monitor  - rate ~112; no recurrent symptoms. Nonionic regulation because of frequent GI procedures. Patient prefers Plavix  . Pneumonia   . Recurrent boils    Bilateral Groin  . Rheumatoid arthritis ([place])    Per Patient Report; associated with OA  . 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 Servando Snare: LIMA-LAD, SVG-bifurcatingD1, SVG-rPDA  . S/p cadaver renal transplant [place]  . Stroke [facility]) 2012   "right eye stroke- half blind now"  . Unstable angina ([place]) 5/01; 3/'02, 8/'03, 10/'04; 1/15   x 5 occurences since Inf-Post STEMI in 1994  PROCEDURE: Procedure(s): LEFT KNEE ARTHROSCOPY WITH LATERAL MENISECTOMY Left on 12/03/2017  CONSULTS: none    HISTORY:  See H&P in chart  HOSPITAL COURSE:  Katherine [name] is a 62 y.o. admitted on 12/03/2017 and found to have a diagnosis of [place].  After appropriate laboratory studies were obtained  they were taken to the operating room on 12/03/2017 and underwent  Procedure(s): LEFT KNEE ARTHROSCOPY WITH LATERAL MENISECTOMY  Left.   They were given perioperative antibiotics:  Anti-infectives (From admission, onward)   Start     Dose/Rate Route Frequency Ordered Stop   12/03/17 1700  erythromycin (E-MYCIN) tablet 500 mg  Status:  Discontinued     500 mg Oral Daily 12/03/17 1500 12/04/17 1642   12/03/17 1600  ceFAZolin (ANCEF) IVPB 1 g/50 mL premix     1 g 100 mL/hr over 30 Minutes Intravenous Every 6 hours 12/03/17 1500 12/04/17 0410    12/03/17 0813  ceFAZolin (ANCEF) IVPB 2g/100 mL premix     2 g 200 mL/hr over 30 Minutes Intravenous On call to O.R. 12/03/17 0813 12/03/17 1100    .  Tolerated the procedure well. Admitted for overnight obs.   POD #1, allowed out of bed to a chair. IV saline locked.  O2 discontionued.  The remainder of the hospital course was dedicated to ambulation and strengthening.   The patient was discharged on 18 Days Post-Op in  Stable condition.  Blood products given:none  DIAGNOSTIC STUDIES: Recent vital signs: No data found.     Recent laboratory studies: No results for input(s): WBC, HGB, HCT, PLT in the last 168 hours. No results for input(s): NA, K, CL, CO2, BUN, CREATININE, GLUCOSE, CALCIUM in the last 168 hours. Lab Results  Component Value Date   INR 1.08 10/14/2016   INR 1.1 01/04/2009   INR 1.1 06/05/2008     Recent Radiographic Studies :  No results found.  DISCHARGE INSTRUCTIONS:   DISCHARGE MEDICATIONS:   Allergies as of 12/04/2017      Reactions   Tetracycline Hives   Hydromorphone Hcl Nausea And Vomiting   Niacin Other (See Comments)   Mouth blisters   Niaspan [niacin Er] Other (See Comments)   Mouth blisters   Sulfa Antibiotics Nausea Only, Other (See Comments)   "Tears up stomach"   Sulfonamide Derivatives Other (See Comments)   Reaction: per patient "tears her stomach up"   Codeine Nausea And Vomiting   Erythromycin Nausea And Vomiting   Hydromorphone Hcl Nausea And Vomiting   Morphine And Related Nausea And Vomiting   Nalbuphine Nausea And Vomiting   Sulfasalazine Nausea Only, Other (See Comments)   per patient "tears her stomach up", "Tears up stomach"   Tape Rash, Other (See Comments)   No "plastic" tape," please      Medication List    TAKE these medications   acetaminophen 500 MG tablet Commonly known as:  TYLENOL Take 1 tablet (500 mg total) by mouth every 6 (six) hours as needed for moderate pain or headache. What changed:  how much to  take   albuterol 108 (90 Base) MCG/ACT inhaler Commonly known as:  VENTOLIN HFA INHALE 2 PUFFS INTO THE LUNGS EVERY 6 (SIX) HOURS AS NEEDED FOR WHEEZING OR SHORTNESS OF BREATH.   azaTHIOprine 50 MG tablet Commonly known as:  IMURAN Take 125 mg by mouth every evening.   budesonide-formoterol 160-4.5 MCG/ACT inhaler Commonly known as:  SYMBICORT Inhale 2 puffs into the lungs 2 (two) times daily.  calcitRIOL 0.25 MCG capsule Commonly known as:  ROCALTROL Take 0.25 mcg by mouth every 3 (three) days.   clopidogrel 75 MG tablet Commonly known as:  PLAVIX Take 1 tablet (75 mg total) by mouth daily.   clotrimazole 10 MG troche Commonly known as:  MYCELEX Take 1 tablet (10 mg total) by mouth 5 (five) times daily. What changed:    when to take this  reasons to take this   cyclobenzaprine 10 MG tablet Commonly known as:  FLEXERIL TAKE 1 TABLET BY MOUTH 3 TIMES DAILY AS NEEDED FOR MUSCLE SPASMS What changed:  See the new instructions.   diltiazem 120 MG 24 hr capsule Commonly known as:  CARDIZEM CD TAKE 1 CAPSULE (120 MG TOTAL) BY MOUTH DAILY. NEED OV.   DULoxetine 30 MG capsule Commonly known as:  CYMBALTA TAKE 2 CAPSULES (60 MG TOTAL) BY MOUTH EVERY MORNING.   erythromycin base 500 MG tablet Commonly known as:  E-MYCIN Take 500 mg by mouth daily.   famotidine 20 MG tablet Commonly known as:  PEPCID Take 20 mg by mouth daily.   ferrous sulfate 325 (65 FE) MG tablet Take 1 tablet (325 mg total) by mouth 3 (three) times daily with meals. What changed:  when to take this   glimepiride 4 MG tablet Commonly known as:  AMARYL Take 4 mg by mouth every morning.   LANTUS 100 UNIT/ML injection Generic drug:  insulin glargine Inject 12 Units into the skin 2 (two) times daily as needed (for BS of > 180).   loperamide 2 MG tablet Commonly known as:  IMODIUM A-D Take 4 mg by mouth 2 (two) times daily.   LORazepam 1 MG tablet Commonly known as:  ATIVAN Take 1 mg by mouth  every morning.   metFORMIN 500 MG tablet Commonly known as:  GLUCOPHAGE Take 500 mg by mouth 2 (two) times daily.   metoprolol tartrate 25 MG tablet Commonly known as:  LOPRESSOR TAKE 1/2 TABLET BY MOUTH DAILY ( TAKE A WHOLE TABLET DAILY IF SBP>150) What changed:    how much to take  how to take this  when to take this  additional instructions   montelukast 10 MG tablet Commonly known as:  SINGULAIR TAKE 1 TABLET (10 MG TOTAL) BY MOUTH DAILY.   nitroGLYCERIN 0.4 MG SL tablet Commonly known as:  NITROSTAT Place 1 tablet (0.4 mg total) under the tongue every 5 (five) minutes as needed for chest pain.   omeprazole 40 MG capsule Commonly known as:  PRILOSEC Take 40 mg by mouth every morning.   oxyCODONE 5 MG immediate release tablet Commonly known as:  Oxy IR/ROXICODONE Take 1 tablet (5 mg total) by mouth every 8 (eight) hours as needed for severe pain.   predniSONE 5 MG tablet Commonly known as:  DELTASONE Take 5 mg by mouth daily with breakfast.   promethazine 25 MG tablet Commonly known as:  PHENERGAN Take 25 mg by mouth at bedtime as needed for nausea or vomiting (or sleep).   rosuvastatin 20 MG tablet Commonly known as:  CRESTOR Take 1 tablet (20 mg total) by mouth daily.   sertraline 100 MG tablet Commonly known as:  ZOLOFT Take 100 mg by mouth every morning.       FOLLOW UP VISIT:    DISPOSITION:   Home  CONDITION:  Stable   Chriss Czar, PA-C  12/21/2017 11:50 AM

## 2017-12-29 ENCOUNTER — Ambulatory Visit: Payer: Medicare Other | Admitting: Physical Therapy

## 2017-12-29 ENCOUNTER — Encounter: Payer: Medicare Other | Admitting: Physical Medicine & Rehabilitation

## 2017-12-31 ENCOUNTER — Telehealth: Payer: Self-pay | Admitting: *Deleted

## 2017-12-31 DIAGNOSIS — M797 Fibromyalgia: Secondary | ICD-10-CM

## 2017-12-31 DIAGNOSIS — E1142 Type 2 diabetes mellitus with diabetic polyneuropathy: Secondary | ICD-10-CM

## 2017-12-31 MED ORDER — DULOXETINE HCL 30 MG PO CPEP: 60.0000 mg | ORAL_CAPSULE | Freq: Every morning | ORAL | 3 refills | Status: DC

## 2017-12-31 NOTE — Telephone Encounter (Signed)
Katherine [name] called and says she needs refill on her cymbalta. She says [facility] tells her we rejected refill request.  I do not see where that has occurred, but I have sent refills to pharmacy and notified Kacy.

## 2018-01-04 ENCOUNTER — Encounter: Payer: Medicare Other | Attending: Registered Nurse | Admitting: Physical Medicine & Rehabilitation

## 2018-01-04 DIAGNOSIS — Z8249 Family history of ischemic heart disease and other diseases of the circulatory system: Secondary | ICD-10-CM | POA: Insufficient documentation

## 2018-01-04 DIAGNOSIS — Z955 Presence of coronary angioplasty implant and graft: Secondary | ICD-10-CM | POA: Insufficient documentation

## 2018-01-04 DIAGNOSIS — Z9049 Acquired absence of other specified parts of digestive tract: Secondary | ICD-10-CM | POA: Insufficient documentation

## 2018-01-04 DIAGNOSIS — I4891 Unspecified atrial fibrillation: Secondary | ICD-10-CM | POA: Diagnosis not present

## 2018-01-04 DIAGNOSIS — N2581 Secondary hyperparathyroidism of renal origin: Secondary | ICD-10-CM | POA: Diagnosis not present

## 2018-01-04 DIAGNOSIS — N182 Chronic kidney disease, stage 2 (mild): Secondary | ICD-10-CM | POA: Diagnosis not present

## 2018-01-04 DIAGNOSIS — Z951 Presence of aortocoronary bypass graft: Secondary | ICD-10-CM | POA: Insufficient documentation

## 2018-01-04 DIAGNOSIS — Z9851 Tubal ligation status: Secondary | ICD-10-CM | POA: Insufficient documentation

## 2018-01-04 DIAGNOSIS — N189 Chronic kidney disease, unspecified: Secondary | ICD-10-CM | POA: Diagnosis not present

## 2018-01-04 DIAGNOSIS — Z8673 Personal history of transient ischemic attack (TIA), and cerebral infarction without residual deficits: Secondary | ICD-10-CM | POA: Insufficient documentation

## 2018-01-04 DIAGNOSIS — N186 End stage renal disease: Secondary | ICD-10-CM | POA: Insufficient documentation

## 2018-01-04 DIAGNOSIS — I639 Cerebral infarction, unspecified: Secondary | ICD-10-CM | POA: Diagnosis not present

## 2018-01-04 DIAGNOSIS — Z808 Family history of malignant neoplasm of other organs or systems: Secondary | ICD-10-CM | POA: Insufficient documentation

## 2018-01-04 DIAGNOSIS — E669 Obesity, unspecified: Secondary | ICD-10-CM | POA: Diagnosis not present

## 2018-01-04 DIAGNOSIS — I480 Paroxysmal atrial fibrillation: Secondary | ICD-10-CM | POA: Insufficient documentation

## 2018-01-04 DIAGNOSIS — I739 Peripheral vascular disease, unspecified: Secondary | ICD-10-CM | POA: Diagnosis not present

## 2018-01-04 DIAGNOSIS — Z8614 Personal history of Methicillin resistant Staphylococcus aureus infection: Secondary | ICD-10-CM | POA: Insufficient documentation

## 2018-01-04 DIAGNOSIS — E785 Hyperlipidemia, unspecified: Secondary | ICD-10-CM | POA: Diagnosis not present

## 2018-01-04 DIAGNOSIS — I120 Hypertensive chronic kidney disease with stage 5 chronic kidney disease or end stage renal disease: Secondary | ICD-10-CM | POA: Insufficient documentation

## 2018-01-04 DIAGNOSIS — Z9884 Bariatric surgery status: Secondary | ICD-10-CM | POA: Insufficient documentation

## 2018-01-04 DIAGNOSIS — D631 Anemia in chronic kidney disease: Secondary | ICD-10-CM | POA: Diagnosis not present

## 2018-01-04 DIAGNOSIS — E118 Type 2 diabetes mellitus with unspecified complications: Secondary | ICD-10-CM | POA: Diagnosis not present

## 2018-01-04 DIAGNOSIS — Z992 Dependence on renal dialysis: Secondary | ICD-10-CM | POA: Insufficient documentation

## 2018-01-04 DIAGNOSIS — N390 Urinary tract infection, site not specified: Secondary | ICD-10-CM | POA: Diagnosis not present

## 2018-01-04 DIAGNOSIS — J449 Chronic obstructive pulmonary disease, unspecified: Secondary | ICD-10-CM | POA: Insufficient documentation

## 2018-01-04 DIAGNOSIS — Z7952 Long term (current) use of systemic steroids: Secondary | ICD-10-CM | POA: Insufficient documentation

## 2018-01-04 DIAGNOSIS — I129 Hypertensive chronic kidney disease with stage 1 through stage 4 chronic kidney disease, or unspecified chronic kidney disease: Secondary | ICD-10-CM | POA: Diagnosis not present

## 2018-01-04 DIAGNOSIS — Z800 Family history of malignant neoplasm of digestive organs: Secondary | ICD-10-CM | POA: Insufficient documentation

## 2018-01-04 DIAGNOSIS — Z940 Kidney transplant status: Secondary | ICD-10-CM | POA: Diagnosis not present

## 2018-01-04 DIAGNOSIS — G4733 Obstructive sleep apnea (adult) (pediatric): Secondary | ICD-10-CM | POA: Insufficient documentation

## 2018-01-04 DIAGNOSIS — K219 Gastro-esophageal reflux disease without esophagitis: Secondary | ICD-10-CM | POA: Insufficient documentation

## 2018-01-04 DIAGNOSIS — E1122 Type 2 diabetes mellitus with diabetic chronic kidney disease: Secondary | ICD-10-CM | POA: Insufficient documentation

## 2018-01-04 DIAGNOSIS — Z87891 Personal history of nicotine dependence: Secondary | ICD-10-CM | POA: Insufficient documentation

## 2018-01-04 DIAGNOSIS — I6523 Occlusion and stenosis of bilateral carotid arteries: Secondary | ICD-10-CM | POA: Insufficient documentation

## 2018-01-04 DIAGNOSIS — I252 Old myocardial infarction: Secondary | ICD-10-CM | POA: Insufficient documentation

## 2018-01-04 DIAGNOSIS — M797 Fibromyalgia: Secondary | ICD-10-CM | POA: Insufficient documentation

## 2018-01-04 DIAGNOSIS — M069 Rheumatoid arthritis, unspecified: Secondary | ICD-10-CM | POA: Insufficient documentation

## 2018-01-04 DIAGNOSIS — I2510 Atherosclerotic heart disease of native coronary artery without angina pectoris: Secondary | ICD-10-CM | POA: Diagnosis not present

## 2018-01-04 LAB — BASIC METABOLIC PANEL
BUN: 17 (ref 4–21)
CREATININE: 0.8 (ref 0.5–1.1)
GLUCOSE: 240
Potassium: 4.6 (ref 3.4–5.3)
Sodium: 134 — AB (ref 137–147)

## 2018-01-08 ENCOUNTER — Other Ambulatory Visit: Payer: Self-pay | Admitting: Physical Medicine & Rehabilitation

## 2018-01-08 DIAGNOSIS — M797 Fibromyalgia: Secondary | ICD-10-CM

## 2018-01-08 DIAGNOSIS — M47816 Spondylosis without myelopathy or radiculopathy, lumbar region: Secondary | ICD-10-CM

## 2018-01-09 ENCOUNTER — Other Ambulatory Visit: Payer: Self-pay | Admitting: Emergency Medicine

## 2018-01-10 ENCOUNTER — Other Ambulatory Visit: Payer: Self-pay | Admitting: Emergency Medicine

## 2018-01-11 IMAGING — DX DG CHEST 2V
2 series · 2 of 2 positions shown · non-contrast
Comparison: None.

CLINICAL DATA: Follow-up nodules

EXAM:
CHEST  2 VIEW

[chest pa]
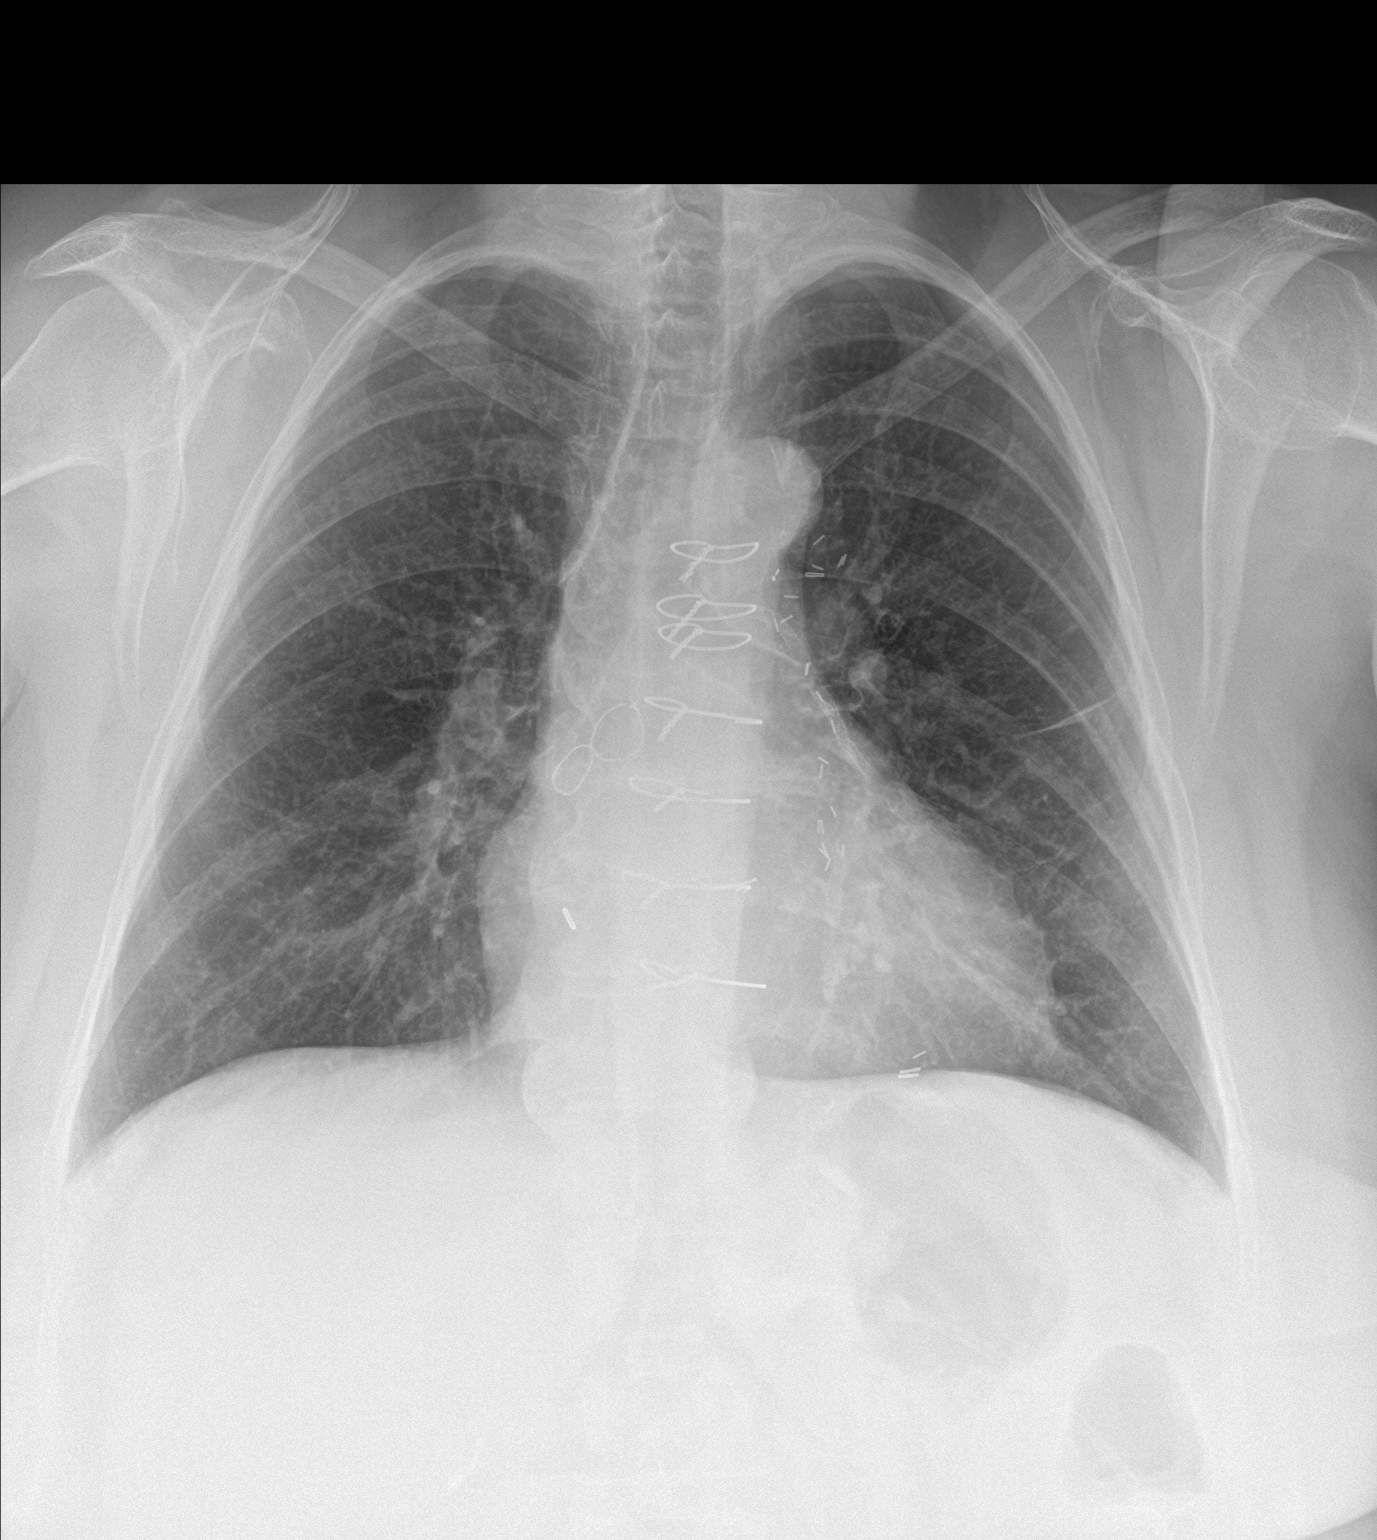

[chest lat]
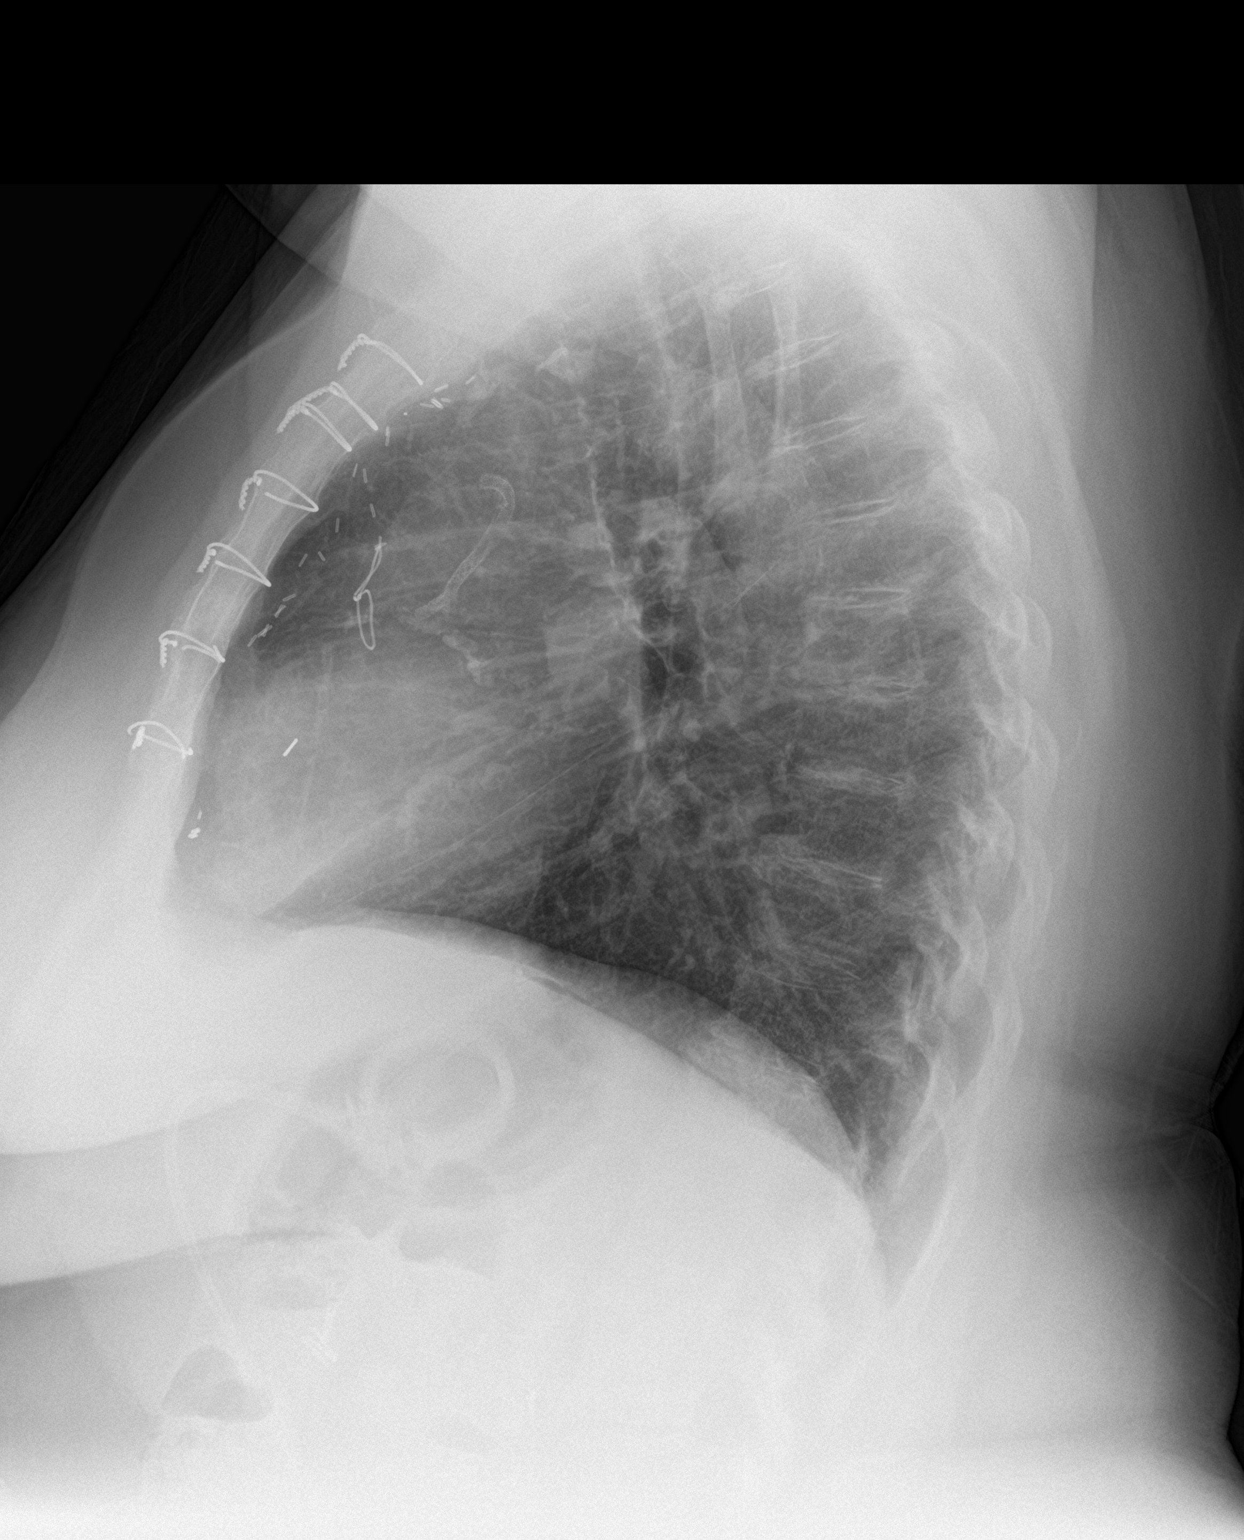

[2 of 2 positions shown; findings below may reference images not displayed]

FINDINGS: Previously seen nodular opacities over the right upper lobe are
improved/ resolved. No suspicious nodular densities. Linear scarring
in the lingula. Heart is normal size. No effusions.
IMPRESSION: Resolution of the previously seen right upper lobe nodules.

No active disease.

## 2018-01-12 ENCOUNTER — Other Ambulatory Visit: Payer: Self-pay | Admitting: Physical Medicine & Rehabilitation

## 2018-01-12 DIAGNOSIS — M47816 Spondylosis without myelopathy or radiculopathy, lumbar region: Secondary | ICD-10-CM

## 2018-01-13 DIAGNOSIS — S83282D Other tear of lateral meniscus, current injury, left knee, subsequent encounter: Secondary | ICD-10-CM | POA: Diagnosis not present

## 2018-01-17 ENCOUNTER — Ambulatory Visit: Payer: Medicare Other | Attending: Orthopedic Surgery | Admitting: Physical Therapy

## 2018-01-18 ENCOUNTER — Telehealth: Payer: Self-pay | Admitting: Cardiology

## 2018-01-18 NOTE — Telephone Encounter (Signed)
Lipid panel from May 2019: Total cholesterol 153, triglycerides 287, HDL 62, LDL 34.  [name], MD

## 2018-01-19 ENCOUNTER — Encounter: Payer: Medicare Other | Attending: Registered Nurse | Admitting: Physical Medicine & Rehabilitation

## 2018-01-19 DIAGNOSIS — E785 Hyperlipidemia, unspecified: Secondary | ICD-10-CM | POA: Insufficient documentation

## 2018-01-19 DIAGNOSIS — I120 Hypertensive chronic kidney disease with stage 5 chronic kidney disease or end stage renal disease: Secondary | ICD-10-CM | POA: Insufficient documentation

## 2018-01-19 DIAGNOSIS — G4733 Obstructive sleep apnea (adult) (pediatric): Secondary | ICD-10-CM | POA: Insufficient documentation

## 2018-01-19 DIAGNOSIS — Z992 Dependence on renal dialysis: Secondary | ICD-10-CM | POA: Insufficient documentation

## 2018-01-19 DIAGNOSIS — Z955 Presence of coronary angioplasty implant and graft: Secondary | ICD-10-CM | POA: Insufficient documentation

## 2018-01-19 DIAGNOSIS — Z951 Presence of aortocoronary bypass graft: Secondary | ICD-10-CM | POA: Insufficient documentation

## 2018-01-19 DIAGNOSIS — N186 End stage renal disease: Secondary | ICD-10-CM | POA: Insufficient documentation

## 2018-01-19 DIAGNOSIS — Z9851 Tubal ligation status: Secondary | ICD-10-CM | POA: Insufficient documentation

## 2018-01-19 DIAGNOSIS — Z808 Family history of malignant neoplasm of other organs or systems: Secondary | ICD-10-CM | POA: Insufficient documentation

## 2018-01-19 DIAGNOSIS — E669 Obesity, unspecified: Secondary | ICD-10-CM | POA: Insufficient documentation

## 2018-01-19 DIAGNOSIS — I252 Old myocardial infarction: Secondary | ICD-10-CM | POA: Insufficient documentation

## 2018-01-19 DIAGNOSIS — I6523 Occlusion and stenosis of bilateral carotid arteries: Secondary | ICD-10-CM | POA: Insufficient documentation

## 2018-01-19 DIAGNOSIS — D631 Anemia in chronic kidney disease: Secondary | ICD-10-CM | POA: Insufficient documentation

## 2018-01-19 DIAGNOSIS — M797 Fibromyalgia: Secondary | ICD-10-CM | POA: Insufficient documentation

## 2018-01-19 DIAGNOSIS — Z87891 Personal history of nicotine dependence: Secondary | ICD-10-CM | POA: Insufficient documentation

## 2018-01-19 DIAGNOSIS — I480 Paroxysmal atrial fibrillation: Secondary | ICD-10-CM | POA: Insufficient documentation

## 2018-01-19 DIAGNOSIS — Z940 Kidney transplant status: Secondary | ICD-10-CM | POA: Insufficient documentation

## 2018-01-19 DIAGNOSIS — E1122 Type 2 diabetes mellitus with diabetic chronic kidney disease: Secondary | ICD-10-CM | POA: Insufficient documentation

## 2018-01-19 DIAGNOSIS — J449 Chronic obstructive pulmonary disease, unspecified: Secondary | ICD-10-CM | POA: Insufficient documentation

## 2018-01-19 DIAGNOSIS — Z9884 Bariatric surgery status: Secondary | ICD-10-CM | POA: Insufficient documentation

## 2018-01-19 DIAGNOSIS — Z8249 Family history of ischemic heart disease and other diseases of the circulatory system: Secondary | ICD-10-CM | POA: Insufficient documentation

## 2018-01-19 DIAGNOSIS — Z8614 Personal history of Methicillin resistant Staphylococcus aureus infection: Secondary | ICD-10-CM | POA: Insufficient documentation

## 2018-01-19 DIAGNOSIS — Z800 Family history of malignant neoplasm of digestive organs: Secondary | ICD-10-CM | POA: Insufficient documentation

## 2018-01-19 DIAGNOSIS — I2510 Atherosclerotic heart disease of native coronary artery without angina pectoris: Secondary | ICD-10-CM | POA: Insufficient documentation

## 2018-01-19 DIAGNOSIS — K219 Gastro-esophageal reflux disease without esophagitis: Secondary | ICD-10-CM | POA: Insufficient documentation

## 2018-01-19 DIAGNOSIS — M069 Rheumatoid arthritis, unspecified: Secondary | ICD-10-CM | POA: Insufficient documentation

## 2018-01-19 DIAGNOSIS — Z7952 Long term (current) use of systemic steroids: Secondary | ICD-10-CM | POA: Insufficient documentation

## 2018-01-19 DIAGNOSIS — Z9049 Acquired absence of other specified parts of digestive tract: Secondary | ICD-10-CM | POA: Insufficient documentation

## 2018-01-19 DIAGNOSIS — Z8673 Personal history of transient ischemic attack (TIA), and cerebral infarction without residual deficits: Secondary | ICD-10-CM | POA: Insufficient documentation

## 2018-01-24 ENCOUNTER — Encounter: Payer: Self-pay | Admitting: Registered Nurse

## 2018-01-24 ENCOUNTER — Encounter (HOSPITAL_BASED_OUTPATIENT_CLINIC_OR_DEPARTMENT_OTHER): Payer: Medicare Other | Admitting: Registered Nurse

## 2018-01-24 VITALS — BP 156/78 | HR 74 | Ht 61.0 in | Wt 232.0 lb

## 2018-01-24 DIAGNOSIS — N186 End stage renal disease: Secondary | ICD-10-CM | POA: Diagnosis not present

## 2018-01-24 DIAGNOSIS — Z87891 Personal history of nicotine dependence: Secondary | ICD-10-CM | POA: Diagnosis not present

## 2018-01-24 DIAGNOSIS — M7581 Other shoulder lesions, right shoulder: Secondary | ICD-10-CM

## 2018-01-24 DIAGNOSIS — M25512 Pain in left shoulder: Secondary | ICD-10-CM | POA: Diagnosis not present

## 2018-01-24 DIAGNOSIS — M069 Rheumatoid arthritis, unspecified: Secondary | ICD-10-CM | POA: Diagnosis not present

## 2018-01-24 DIAGNOSIS — J449 Chronic obstructive pulmonary disease, unspecified: Secondary | ICD-10-CM | POA: Diagnosis not present

## 2018-01-24 DIAGNOSIS — I6523 Occlusion and stenosis of bilateral carotid arteries: Secondary | ICD-10-CM | POA: Diagnosis not present

## 2018-01-24 DIAGNOSIS — E1122 Type 2 diabetes mellitus with diabetic chronic kidney disease: Secondary | ICD-10-CM | POA: Diagnosis not present

## 2018-01-24 DIAGNOSIS — I2510 Atherosclerotic heart disease of native coronary artery without angina pectoris: Secondary | ICD-10-CM | POA: Diagnosis not present

## 2018-01-24 DIAGNOSIS — M797 Fibromyalgia: Secondary | ICD-10-CM

## 2018-01-24 DIAGNOSIS — I120 Hypertensive chronic kidney disease with stage 5 chronic kidney disease or end stage renal disease: Secondary | ICD-10-CM | POA: Diagnosis not present

## 2018-01-24 DIAGNOSIS — D631 Anemia in chronic kidney disease: Secondary | ICD-10-CM | POA: Diagnosis not present

## 2018-01-24 DIAGNOSIS — Z951 Presence of aortocoronary bypass graft: Secondary | ICD-10-CM | POA: Diagnosis not present

## 2018-01-24 DIAGNOSIS — Z7952 Long term (current) use of systemic steroids: Secondary | ICD-10-CM | POA: Diagnosis not present

## 2018-01-24 DIAGNOSIS — Z940 Kidney transplant status: Secondary | ICD-10-CM | POA: Diagnosis not present

## 2018-01-24 DIAGNOSIS — G894 Chronic pain syndrome: Secondary | ICD-10-CM

## 2018-01-24 DIAGNOSIS — G8929 Other chronic pain: Secondary | ICD-10-CM | POA: Diagnosis not present

## 2018-01-24 DIAGNOSIS — M47816 Spondylosis without myelopathy or radiculopathy, lumbar region: Secondary | ICD-10-CM

## 2018-01-24 DIAGNOSIS — G4733 Obstructive sleep apnea (adult) (pediatric): Secondary | ICD-10-CM | POA: Diagnosis not present

## 2018-01-24 DIAGNOSIS — F329 Major depressive disorder, single episode, unspecified: Secondary | ICD-10-CM

## 2018-01-24 DIAGNOSIS — Z955 Presence of coronary angioplasty implant and graft: Secondary | ICD-10-CM | POA: Diagnosis not present

## 2018-01-24 DIAGNOSIS — M25551 Pain in right hip: Secondary | ICD-10-CM | POA: Diagnosis not present

## 2018-01-24 DIAGNOSIS — Z8673 Personal history of transient ischemic attack (TIA), and cerebral infarction without residual deficits: Secondary | ICD-10-CM | POA: Diagnosis not present

## 2018-01-24 DIAGNOSIS — Z992 Dependence on renal dialysis: Secondary | ICD-10-CM | POA: Diagnosis not present

## 2018-01-24 DIAGNOSIS — Z9884 Bariatric surgery status: Secondary | ICD-10-CM | POA: Diagnosis not present

## 2018-01-24 DIAGNOSIS — K219 Gastro-esophageal reflux disease without esophagitis: Secondary | ICD-10-CM | POA: Diagnosis not present

## 2018-01-24 DIAGNOSIS — M170 Bilateral primary osteoarthritis of knee: Secondary | ICD-10-CM

## 2018-01-24 DIAGNOSIS — M62838 Other muscle spasm: Secondary | ICD-10-CM | POA: Diagnosis not present

## 2018-01-24 DIAGNOSIS — E785 Hyperlipidemia, unspecified: Secondary | ICD-10-CM | POA: Diagnosis not present

## 2018-01-24 DIAGNOSIS — Z9851 Tubal ligation status: Secondary | ICD-10-CM | POA: Diagnosis not present

## 2018-01-24 DIAGNOSIS — E1142 Type 2 diabetes mellitus with diabetic polyneuropathy: Secondary | ICD-10-CM

## 2018-01-24 DIAGNOSIS — I480 Paroxysmal atrial fibrillation: Secondary | ICD-10-CM | POA: Diagnosis not present

## 2018-01-24 DIAGNOSIS — Z9861 Coronary angioplasty status: Secondary | ICD-10-CM

## 2018-01-24 DIAGNOSIS — M25511 Pain in right shoulder: Secondary | ICD-10-CM

## 2018-01-24 DIAGNOSIS — I252 Old myocardial infarction: Secondary | ICD-10-CM | POA: Diagnosis not present

## 2018-01-24 DIAGNOSIS — E669 Obesity, unspecified: Secondary | ICD-10-CM | POA: Diagnosis not present

## 2018-01-24 MED ORDER — OXYCODONE HCL 5 MG PO TABS: 5.0000 mg | ORAL_TABLET | Freq: Three times a day (TID) | ORAL | 0 refills | Status: DC | PRN

## 2018-01-24 NOTE — Patient Instructions (Signed)
Call office in three weeks to evaluate medication .   [phone]

## 2018-01-24 NOTE — Progress Notes (Signed)
Subjective:    Patient ID: Katherine [name], female    DOB: 1956/07/03, 62 y.o.   MRN: [id number]  HPI: [name] is a 62 year old female who returns for follow up appointment for chronic pain and medication refill. She states her pain is located in her bilateral hands, bilateral shoulders, mid-lower back, right hip and left knee. She rates her pain 8. Also reports she has an open wound on her right thumb, showed this provider a picture, she wouldn't remove the dressing. States she will go to Urgent care today, she was instructed to go to Urgent Care or her PCP she verbalizes understanding.   [name] reports she is under stress, she has to find a new place to live by Friday, she was instructed to reach out to the Housing Unit, she verbalizes understanding. Emotional support given.   [name] Morphine equivalent is 22.50 MME. She is also prescribed Lorazepam by Dr. Jimmy Footman.We have discussed the black box warning of using opioids and benzodiazepines. I highlighted the dangers of using these drugs together and discussed the adverse events including respiratory suppression, overdose, cognitive impairment and importance of compliance with current regimen. We will continue to monitor and adjust as indicated.    Pain Inventory Average Pain 8 Pain Right Now 8 My pain is sharp, burning, stabbing, tingling and aching  In the last 24 hours, has pain interfered with the following? General activity 7 Relation with others 7 Enjoyment of life 7 What TIME of day is your pain at its worst? night, morning Sleep (in general) Fair  Pain is worse with: walking, bending, standing and some activites Pain improves with: rest, heat/ice, therapy/exercise and medication Relief from Meds: 8  Mobility use a walker ability to climb steps?  yes do you drive?  yes  Function disabled: date disabled 1989  Neuro/Psych bowel control problems weakness trouble  walking spasms depression anxiety  Prior Studies bone scan x-rays CT/MRI  Physicians involved in your care    Family History  Problem Relation Age of Onset  . Cancer Mother        liver  . Heart disease Father   . Cancer Father        colon  . Arrhythmia Brother        Atrial Fibrillation  . Arrhythmia Paternal Aunt        Atrial Fibrillation   Social History   Socioeconomic History  . Marital status: Married    Spouse name: Not on file  . Number of children: Not on file  . Years of education: Not on file  . Highest education level: Not on file  Occupational History  . Not on file  Social Needs  . Financial resource strain: Not on file  . Food insecurity:    Worry: Not on file    Inability: Not on file  . Transportation needs:    Medical: Not on file    Non-medical: Not on file  Tobacco Use  . Smoking status: Former Smoker    Packs/day: 1.00    Years: 30.00    Pack years: 30.00    Types: Cigarettes    Last attempt to quit: 08/17/2002    Years since quitting: 15.4  . Smokeless tobacco: Never Used  Substance and Sexual Activity  . Alcohol use: No  . Drug use: No  . Sexual activity: Not on file  Lifestyle  . Physical activity:    Days per week: Not on file  Minutes per session: Not on file  . Stress: Not on file  Relationships  . Social connections:    Talks on phone: Not on file    Gets together: Not on file    Attends religious service: Not on file    Active member of club or organization: Not on file    Attends meetings of clubs or organizations: Not on file    Relationship status: Not on file  Other Topics Concern  . Not on file  Social History Narrative   She is currently married, and the caregiver of her husband who is recovering from surgery for tongue cancer now diagnosed with lung cancer. Prior to his diagnosis of her husband, she actually had adopted a 61-year-old child who she knows caring for as well. With all the surgeries, they have  been quite financially troubled. Thanks the help of her community and church, they have been able to stay "alfoat."     She is a former smoker who quit in 2004 after a 30-pack-year history.   She is active chasing a 40-year-old child, does not do routine exercise. She's been quite depressed with the condition of her husband, and admits to eating comfort herself.   She does not drink alcohol.   Past Surgical History:  Procedure Laterality Date  . CARDIAC CATHETERIZATION  5/'01, 3/'02, 8/'03, 10/'04; 1/'15   08/22/2013: LAD & RCA 100%; LIMA-LAD & SVG-rPDA patent; Cx-- OM1 60%, OM2 ostial ~50%; SVG-D1 - 80% mid, 50% distal ISR --PCI  . CATHETER REMOVAL    . CHOLECYSTECTOMY N/A 10/29/2014   Procedure: LAPAROSCOPIC CHOLECYSTECTOMY WITH INTRAOPERATIVE CHOLANGIOGRAM;  Surgeon: Excell Seltzer, MD;  Location: [facility];  Service: General;  Laterality: N/A;  . CORONARY ANGIOPLASTY  1994   x5  . CORONARY ARTERY BYPASS GRAFT  1995   LIMA-LAD, SVG-RPDA, SVG-D1  . ESOPHAGOGASTRODUODENOSCOPY N/A 10/15/2016   Procedure: ESOPHAGOGASTRODUODENOSCOPY (EGD);  Surgeon: Wilford Corner, MD;  Location[facility] END[facility];  Service: Endoscopy;  Laterality: N/A;  . INCISE AND DRAIN ABCESS    . KIDNEY TRANSPLANT  1991  . KNEE ARTHROSCOPY WITH LATERAL MENISECTOMY Left 12/03/2017   Procedure: LEFT KNEE ARTHROSCOPY WITH LATERAL MENISECTOMY;  Surgeon: Earlie Server, MD;  Location: [place];  Service: Orthopedics;  Laterality: Left;  . LAPAROSCOPIC GASTRIC BANDING  04/2004; 10/'09, 2/'10   Port Replacement x 2  . LEFT HEART CATHETERIZATION WITH CORONARY/GRAFT ANGIOGRAM N/A 08/23/2013   Procedure: LEFT HEART CATHETERIZATION WITH Beatrix Fetters;  Surgeon: Wellington Hampshire, MD;  Location: [facility] CATH LAB;  Service: Cardiovascular;  Laterality: N/A;  . Lower Extremity Arterial Dopplers  08/2013   ABI: R 0.96, L 1.04  . MULTIPLE TOOTH EXTRACTIONS  age 66  . NM MYOVIEW LTD  03/2016   EF 62%. LOW RISK. C/W prior MI - no Ischemia.  Apical hypokinesis.  [name] Kitchen PERCUTANEOUS CORONARY STENT INTERVENTION (PCI-S)  5/'01, 3/'02, 8/'03, 10/'04;   '01 - S660 BMS 2.5 x 9 - dSVG-D1 into D1; '02- post-stent stenosis - 2.5 x 8 Pixel BMS; '8\03: ISR/Thrombosis into native D1 - AngioJet, 2.5 x 13 Pixel; '04 - ISR 95% - covered stented area with Taxus DES 2.5 mm x 20 (2.88)  . PERCUTANEOUS CORONARY STENT INTERVENTION (PCI-S)  08/23/2013   Procedure: PERCUTANEOUS CORONARY STENT INTERVENTION (PCI-S);  Surgeon: Wellington Hampshire, MD;  Location: [facility] CATH LAB;  Service: Cardiovascular;;mid SVG-D1 80%; distal stent ~50% ISR; Promus Prermier DES 2.75 mm xc 20 mm (2.8 mm)  . PORT-A-CATH REMOVAL     kidney  .  TRANSTHORACIC ECHOCARDIOGRAM  02/2016   EF 55-60%. Septal dyssynergy from CABG GR 2 DD. Aortic valve is trileaflet the functional bicuspid with sclerosis but not yet notable for stenosis stenosis.; no Significant change  . TUBAL LIGATION    . wrist fistula repair Left    dialysis for one year   Past Medical History:  Diagnosis Date  . Anemia   . Anxiety   . Bilateral carotid artery stenosis    Carotid duplex 09/4266: 3-41% LICA, 96-22% RICA, >29% RECA, f/u 1 yr suggested  . CAD (coronary artery disease) of bypass graft 5/01; 3/'02, 8/'03, 10/'04; 1/15   PCI x 5 to SVG-D1   . CAD in native artery 07/1993    3 Vessel Disease (LAD-D1 & RCA) -- CABG  . CAD S/P percutaneous coronary angioplasty    PCI to SVG-D1 insertion/native D1 x 4 = '01 -(S660 BMS 2.5 x 9 - insertion into D1; '02 - distal overlap ACS Pixel 2.5 x 8  BMS; '03 distal/native ISR/Thrombosis - Pixel 2.5 x 13; '04 - ISR-  Taxus 2.5 x 20 (covered all);; 1/15 - mid SVG-D1 (50% distal ISR) - Promus P 2.75 x 20 -- 2.8 mm  . COPD mixed type ([place])    Followed by Dr. Lamonte Sakai "pulmonologist said no COPD"  . Depression   . Depression with anxiety   . Diabetes mellitus type 2 in obese ([place])   . Diarrhea    started after cholecystectomy and mass removed from intestine  . Dyslipidemia, goal LDL  below 70    08/2012: TC 137, TG 200, HDL 32!, LDL 45; on statin (followed by Dr.Deterding)  . ESRD (end stage renal disease) ([place]) 1991   s/p Cadaveric Renal Transplant [facility] - Dr. Jimmy Footman)   . Family history of adverse reaction to anesthesia    mom's bp dropped during/after anesthesia  . Fibromyalgia   . GERD (gastroesophageal reflux disease)   . Glomerulonephritis, chronic, rapidly progressive 55  . 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 07/1993   Rescue PTCA of RCA -- referred for CABG.  . H/O: GI bleed   . Headache    migraines in the past  . History of kidney stones   . Hypertension associated with diabetes ([place])   . MRSA (methicillin resistant staph aureus) culture positive   . Obesity    s/p Lap Band 04/2004- Port Replacement 10/09 & 2/10 for infection; Dr. Excell Seltzer.  . OSA (obstructive sleep apnea)    no longer on CPAP or home O2, states she doesn't need now after lap band  . PAD (peripheral artery disease) ([place]) 08/2013   LEA Dopplers to be read by Dr. Fletcher Anon  . PAF (paroxysmal atrial fibrillation) ([place]) 06/2014   Noted on CardioNet Monitor  - rate ~112; no recurrent symptoms. Nonionic regulation because of frequent GI procedures. Patient prefers Plavix  . Pneumonia   . Recurrent boils    Bilateral Groin  . Rheumatoid arthritis ([place])    Per Patient Report; associated with OA  . 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 Servando Snare: LIMA-LAD, SVG-bifurcatingD1, SVG-rPDA  . S/p cadaver renal transplant [place]  . Stroke [facility]) 2012   "right eye stroke- half blind now"  . Unstable angina ([place]) 5/01; 3/'02, 8/'03, 10/'04; 1/15   x 5 occurences since Inf-Post STEMI in 1994   BP (!) 156/78   Pulse 74   Ht 5\' 1"  (1.549 m)   Wt 232 lb (105.2 kg)   SpO2  95%   BMI 43.84 kg/m   Opioid Risk Score:   Fall Risk Score:  `1  Depression screen PHQ 2/9  Depression screen [facility] 2/9 03/29/2017 05/19/2016 03/30/2016  Decreased Interest 3 3 3   Down, Depressed,  Hopeless 3 3 3   PHQ - 2 Score 6 6 6   Altered sleeping - - 3  Tired, decreased energy - - 3  Change in appetite - - 2  Feeling bad or failure about yourself  - - 3  Trouble concentrating - - 2  Moving slowly or fidgety/restless - - 0  Suicidal thoughts - - 0  PHQ-9 Score - - 19  Some recent data might be hidden    Review of Systems  Constitutional: Positive for fever.       Easy bleeding, high blood sugar  Respiratory: Positive for shortness of breath and wheezing.   Gastrointestinal: Positive for diarrhea and vomiting.  Genitourinary: Positive for difficulty urinating.  All other systems reviewed and are negative.      Objective:   Physical Exam  Constitutional: She is oriented to person, place, and time. She appears well-developed and well-nourished.  HENT:  Head: Normocephalic and atraumatic.  Neck: Normal range of motion. Neck supple.  Cardiovascular: Normal rate and regular rhythm.  Pulmonary/Chest: Effort normal and breath sounds normal.  Musculoskeletal:  Normal Muscle Bulk and Muscle Testing Reveals: Upper Extremities: Full ROM and Muscle Strength 5/5 Bilateral AC Joint Tenderness Thoracic Hypersensitivity Lumbar Paraspinal Tenderness: L-3-L-5 Lower Extremities: Full ROM and Muscle Strength 5/5 Arises from Table slowly using walker for support Narrow Based Gait   Neurological: She is alert and oriented to person, place, and time.  Skin: Skin is warm and dry.  Psychiatric: She has a normal mood and affect.  Nursing note and vitals reviewed.         Assessment & Plan:  1. Fibromyalgia: Continue with HEP as tolerated. Continue Tegretol and Cymbalta. 01/24/2018 2. Rheumatoid Arthritis: Refilled: Oxycodone 5mg  one tablet every 8 hours as needed for severe pain #60. Continueto Monitor. 01/24/2018 3. Osteoarthritis--polyarticular: Continue with current medication regime and continue to monitor.01/24/2018 4. ESRD with hx of renal transplant on chronic steroids:  Nephrology Following.01/24/2018 5. Right rotator cuff tendonitis/like degenerative/inflammatory changes in the right shoulder also. Continue HEP and Continue to Monitor.01/24/2018 6. Muscle Spasm: Continue Flexeril. 01/24/2018 7. Depression: Scheduled an appointment withl Dr. Sima Matas. 01/24/2018 8. Spondylosis of Lumbar Region without Myelopathy or Radiculopathy: Continue HEP as Tolerated.01/24/2018 9. Right Hip Pain: Continue HEP alternate Ice and Heat Therapy. 01/24/2018 10. Type 2 DM with Peripheral Neuropathy: Continue Tegretol. 01/24/2018  20 minutes of face to face patient care time was spent during this visit. All questions were encouraged and answered.   F/U in 1 month

## 2018-01-25 ENCOUNTER — Encounter (HOSPITAL_COMMUNITY): Payer: Self-pay | Admitting: Emergency Medicine

## 2018-01-25 ENCOUNTER — Other Ambulatory Visit: Payer: Self-pay

## 2018-01-25 ENCOUNTER — Ambulatory Visit (INDEPENDENT_AMBULATORY_CARE_PROVIDER_SITE_OTHER)
Admission: EM | Admit: 2018-01-25 | Discharge: 2018-01-25 | Disposition: A | Discharge status: Home / Self Care | Payer: Medicare Other | Source: Home / Self Care | Attending: Family Medicine | Admitting: Family Medicine

## 2018-01-25 DIAGNOSIS — Z794 Long term (current) use of insulin: Secondary | ICD-10-CM | POA: Insufficient documentation

## 2018-01-25 DIAGNOSIS — G4733 Obstructive sleep apnea (adult) (pediatric): Secondary | ICD-10-CM | POA: Insufficient documentation

## 2018-01-25 DIAGNOSIS — I120 Hypertensive chronic kidney disease with stage 5 chronic kidney disease or end stage renal disease: Secondary | ICD-10-CM | POA: Insufficient documentation

## 2018-01-25 DIAGNOSIS — M1711 Unilateral primary osteoarthritis, right knee: Secondary | ICD-10-CM | POA: Insufficient documentation

## 2018-01-25 DIAGNOSIS — K219 Gastro-esophageal reflux disease without esophagitis: Secondary | ICD-10-CM

## 2018-01-25 DIAGNOSIS — E785 Hyperlipidemia, unspecified: Secondary | ICD-10-CM | POA: Insufficient documentation

## 2018-01-25 DIAGNOSIS — I252 Old myocardial infarction: Secondary | ICD-10-CM

## 2018-01-25 DIAGNOSIS — E1151 Type 2 diabetes mellitus with diabetic peripheral angiopathy without gangrene: Secondary | ICD-10-CM | POA: Insufficient documentation

## 2018-01-25 DIAGNOSIS — M069 Rheumatoid arthritis, unspecified: Secondary | ICD-10-CM

## 2018-01-25 DIAGNOSIS — Z79899 Other long term (current) drug therapy: Secondary | ICD-10-CM

## 2018-01-25 DIAGNOSIS — Z8673 Personal history of transient ischemic attack (TIA), and cerebral infarction without residual deficits: Secondary | ICD-10-CM | POA: Insufficient documentation

## 2018-01-25 DIAGNOSIS — Z87891 Personal history of nicotine dependence: Secondary | ICD-10-CM | POA: Insufficient documentation

## 2018-01-25 DIAGNOSIS — J449 Chronic obstructive pulmonary disease, unspecified: Secondary | ICD-10-CM

## 2018-01-25 DIAGNOSIS — Z7952 Long term (current) use of systemic steroids: Secondary | ICD-10-CM | POA: Insufficient documentation

## 2018-01-25 DIAGNOSIS — I480 Paroxysmal atrial fibrillation: Secondary | ICD-10-CM | POA: Insufficient documentation

## 2018-01-25 DIAGNOSIS — I2510 Atherosclerotic heart disease of native coronary artery without angina pectoris: Secondary | ICD-10-CM | POA: Insufficient documentation

## 2018-01-25 DIAGNOSIS — M47816 Spondylosis without myelopathy or radiculopathy, lumbar region: Secondary | ICD-10-CM

## 2018-01-25 DIAGNOSIS — B999 Unspecified infectious disease: Secondary | ICD-10-CM

## 2018-01-25 DIAGNOSIS — Z951 Presence of aortocoronary bypass graft: Secondary | ICD-10-CM | POA: Insufficient documentation

## 2018-01-25 DIAGNOSIS — Z940 Kidney transplant status: Secondary | ICD-10-CM | POA: Diagnosis not present

## 2018-01-25 DIAGNOSIS — D649 Anemia, unspecified: Secondary | ICD-10-CM | POA: Insufficient documentation

## 2018-01-25 DIAGNOSIS — N186 End stage renal disease: Secondary | ICD-10-CM | POA: Insufficient documentation

## 2018-01-25 DIAGNOSIS — Z87442 Personal history of urinary calculi: Secondary | ICD-10-CM

## 2018-01-25 DIAGNOSIS — Z955 Presence of coronary angioplasty implant and graft: Secondary | ICD-10-CM | POA: Insufficient documentation

## 2018-01-25 DIAGNOSIS — Z8614 Personal history of Methicillin resistant Staphylococcus aureus infection: Secondary | ICD-10-CM | POA: Insufficient documentation

## 2018-01-25 DIAGNOSIS — Z9884 Bariatric surgery status: Secondary | ICD-10-CM | POA: Insufficient documentation

## 2018-01-25 DIAGNOSIS — E1122 Type 2 diabetes mellitus with diabetic chronic kidney disease: Secondary | ICD-10-CM

## 2018-01-25 DIAGNOSIS — E11628 Type 2 diabetes mellitus with other skin complications: Secondary | ICD-10-CM

## 2018-01-25 DIAGNOSIS — Z7902 Long term (current) use of antithrombotics/antiplatelets: Secondary | ICD-10-CM | POA: Insufficient documentation

## 2018-01-25 DIAGNOSIS — Z7951 Long term (current) use of inhaled steroids: Secondary | ICD-10-CM

## 2018-01-25 DIAGNOSIS — L03011 Cellulitis of right finger: Secondary | ICD-10-CM | POA: Diagnosis not present

## 2018-01-25 DIAGNOSIS — L928 Other granulomatous disorders of the skin and subcutaneous tissue: Secondary | ICD-10-CM

## 2018-01-25 DIAGNOSIS — M797 Fibromyalgia: Secondary | ICD-10-CM | POA: Insufficient documentation

## 2018-01-25 DIAGNOSIS — S61001A Unspecified open wound of right thumb without damage to nail, initial encounter: Secondary | ICD-10-CM | POA: Diagnosis not present

## 2018-01-25 DIAGNOSIS — Z6841 Body Mass Index (BMI) 40.0 and over, adult: Secondary | ICD-10-CM | POA: Diagnosis not present

## 2018-01-25 DIAGNOSIS — Z5189 Encounter for other specified aftercare: Secondary | ICD-10-CM | POA: Diagnosis not present

## 2018-01-25 DIAGNOSIS — R079 Chest pain, unspecified: Secondary | ICD-10-CM | POA: Diagnosis not present

## 2018-01-25 DIAGNOSIS — L980 Pyogenic granuloma: Secondary | ICD-10-CM

## 2018-01-25 DIAGNOSIS — F418 Other specified anxiety disorders: Secondary | ICD-10-CM

## 2018-01-25 DIAGNOSIS — E1169 Type 2 diabetes mellitus with other specified complication: Secondary | ICD-10-CM | POA: Diagnosis not present

## 2018-01-25 MED ORDER — LIDOCAINE-EPINEPHRINE-TETRACAINE (LET) SOLUTION
3.0000 mL | Freq: Once | NASAL | Status: AC
  Administered 2018-01-25: 3 mL via TOPICAL

## 2018-01-25 MED ORDER — DOXYCYCLINE HYCLATE 100 MG PO CAPS: 100.0000 mg | ORAL_CAPSULE | Freq: Two times a day (BID) | ORAL | 0 refills | Status: DC

## 2018-01-25 MED ORDER — LIDOCAINE-EPINEPHRINE-TETRACAINE (LET) SOLUTION
NASAL | Status: AC
  Filled 2018-01-25: qty 3

## 2018-01-25 NOTE — Discharge Instructions (Addendum)
Try to keep area dry, clean and put gauze dressing once a day Take the antibiotic 2 x a day Come back in 3 days to see me in follow up Return sooner for swelling, pain, redness or increased symptoms

## 2018-01-25 NOTE — ED Triage Notes (Signed)
Pt states she had what she thought was a callous on her right thumb that has progressed into an open wound with oozing and a very strong odor.  Pt is diabetic, and has had a kidney transplant.  She was to see a hand specialist, but states they wont see her because the wound is oozing.

## 2018-01-25 NOTE — ED Provider Notes (Signed)
[facility]    CSN: [id number] Arrival date & time: 01/25/18  1308     History   Chief Complaint Chief Complaint  Patient presents with  . Wound Check    HPI Katherine [name] is a 62 y.o. female.   HPI  Patient is here for a wound on her thumb.  Is been present for weeks.  She states that it is growing larger.  It is draining.  It has a bad odor to it it is tender.  She has seen her kidney doctor who wanted her to get medical care.  She called a hand specialist who would not see an open wound.  No fever or chills.  No advancing redness into her wrist or hand.  She is never had a lesion like this before.  She is on immunosuppressant medication because of a kidney transplant.  Past Medical History:  Diagnosis Date  . Anemia   . Anxiety   . Bilateral carotid artery stenosis    Carotid duplex 10/3293: 1-88% LICA, 41-66% RICA, >06% RECA, f/u 1 yr suggested  . CAD (coronary artery disease) of bypass graft 5/01; 3/'02, 8/'03, 10/'04; 1/15   PCI x 5 to SVG-D1   . CAD in native artery 07/1993    3 Vessel Disease (LAD-D1 & RCA) -- CABG  . CAD S/P percutaneous coronary angioplasty    PCI to SVG-D1 insertion/native D1 x 4 = '01 -(S660 BMS 2.5 x 9 - insertion into D1; '02 - distal overlap ACS Pixel 2.5 x 8  BMS; '03 distal/native ISR/Thrombosis - Pixel 2.5 x 13; '04 - ISR-  Taxus 2.5 x 20 (covered all);; 1/15 - mid SVG-D1 (50% distal ISR) - Promus P 2.75 x 20 -- 2.8 mm  . COPD mixed type ([place])    Followed by Dr. Lamonte Sakai "pulmonologist said no COPD"  . Depression   . Depression with anxiety   . Diabetes mellitus type 2 in obese ([place])   . Diarrhea    started after cholecystectomy and mass removed from intestine  . Dyslipidemia, goal LDL below 70    08/2012: TC 137, TG 200, HDL 32!, LDL 45; on statin (followed by Dr.Deterding)  . ESRD (end stage renal disease) ([place]) 1991   s/p Cadaveric Renal Transplant [facility] - Dr. Jimmy Footman)   . Family history of adverse reaction to anesthesia     mom's bp dropped during/after anesthesia  . Fibromyalgia   . GERD (gastroesophageal reflux disease)   . Glomerulonephritis, chronic, rapidly progressive 2  . 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 07/1993   Rescue PTCA of RCA -- referred for CABG.  . H/O: GI bleed   . Headache    migraines in the past  . History of kidney stones   . Hypertension associated with diabetes ([place])   . MRSA (methicillin resistant staph aureus) culture positive   . Obesity    s/p Lap Band 04/2004- Port Replacement 10/09 & 2/10 for infection; Dr. Excell Seltzer.  . OSA (obstructive sleep apnea)    no longer on CPAP or home O2, states she doesn't need now after lap band  . PAD (peripheral artery disease) ([place]) 08/2013   LEA Dopplers to be read by Dr. Fletcher Anon  . PAF (paroxysmal atrial fibrillation) ([place]) 06/2014   Noted on CardioNet Monitor  - rate ~112; no recurrent symptoms. Nonionic regulation because of frequent GI procedures. Patient prefers Plavix  . Pneumonia   . Recurrent boils    Bilateral Groin  .  Rheumatoid arthritis ([place])    Per Patient Report; associated with OA  . 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 Servando Snare: LIMA-LAD, SVG-bifurcatingD1, SVG-rPDA  . S/p cadaver renal transplant [place]  . Stroke [facility]) 2012   "right eye stroke- half blind now"  . Unstable angina ([place]) 5/01; 3/'02, 8/'03, 10/'04; 1/15   x 5 occurences since Inf-Post STEMI in 1994    Patient Active Problem List   Diagnosis Date Noted  . Status post arthroscopy of knee 12/03/2017  . Primary osteoarthritis of right knee 08/18/2017  . Aortic valve sclerosis 01/28/2017  . 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 Spondylosis of lumbar region without myelopathy or radiculopathy 03/30/2016  . Type 2 diabetes mellitus with peripheral neuropathy ([place]) 03/30/2016  . PAF (paroxysmal atrial fibrillation) ([place]) 06/28/2014  . Hypertension associated with diabetes ([place])   . PAD (peripheral artery disease) ([place]) 08/17/2013    . Stenosis of right carotid artery without infarction 10/08/2012  . Dyslipidemia, goal LDL below 70 10/08/2012    Class: Diagnosis of  . Mitral annular calcification 10/08/2012  . Renal transplant disorder 06/12/2012  . Chronic allergic rhinitis 04/29/2011  . Sleep apnea 09/09/2007  . Extrinsic asthma 09/09/2007  . GERD 09/09/2007  . Morbid obesity - s/p Lap Band 9/'05 05/07/2004    Class: Diagnosis of  . S/P CABG (coronary artery bypass graft) x 3 09/07/1993    Class: History of    Past Surgical History:  Procedure Laterality Date  . CARDIAC CATHETERIZATION  5/'01, 3/'02, 8/'03, 10/'04; 1/'15   08/22/2013: LAD & RCA 100%; LIMA-LAD & SVG-rPDA patent; Cx-- OM1 60%, OM2 ostial ~50%; SVG-D1 - 80% mid, 50% distal ISR --PCI  . CATHETER REMOVAL    . CHOLECYSTECTOMY N/A 10/29/2014   Procedure: LAPAROSCOPIC CHOLECYSTECTOMY WITH INTRAOPERATIVE CHOLANGIOGRAM;  Surgeon: Excell Seltzer, MD;  Location: [facility] ORS;  Service: General;  Laterality: N/A;  . CORONARY ANGIOPLASTY  1994   x5  . CORONARY ARTERY BYPASS GRAFT  1995   LIMA-LAD, SVG-RPDA, SVG-D1  . ESOPHAGOGASTRODUODENOSCOPY N/A 10/15/2016   Procedure: ESOPHAGOGASTRODUODENOSCOPY (EGD);  Surgeon: Wilford Corner, MD;  Location: [facility] ENDOSCOPY;  Service: Endoscopy;  Laterality: N/A;  . INCISE AND DRAIN ABCESS    . KIDNEY TRANSPLANT  1991  . KNEE ARTHROSCOPY WITH LATERAL MENISECTOMY Left 12/03/2017   Procedure: LEFT KNEE ARTHROSCOPY WITH LATERAL MENISECTOMY;  Surgeon: Earlie Server, MD;  Location: [facility];  Service: Orthopedics;  Laterality: Left;  . LAPAROSCOPIC GASTRIC BANDING  04/2004; 10/'09, 2/'10   Port Replacement x 2  . LEFT HEART CATHETERIZATION WITH CORONARY/GRAFT ANGIOGRAM N/A 08/23/2013   Procedure: LEFT HEART CATHETERIZATION WITH Beatrix Fetters;  Surgeon: Wellington Hampshire, MD;  Location: [facility] CATH LAB;  Service: Cardiovascular;  Laterality: N/A;  . Lower Extremity Arterial Dopplers  08/2013   ABI: R 0.96, L 1.04  . MULTIPLE  TOOTH EXTRACTIONS  age 50  . NM MYOVIEW LTD  03/2016   EF 62%. LOW RISK. C/W prior MI - no Ischemia. Apical hypokinesis.  [name] Kitchen PERCUTANEOUS CORONARY STENT INTERVENTION (PCI-S)  5/'01, 3/'02, 8/'03, 10/'04;   '01 - S660 BMS 2.5 x 9 - dSVG-D1 into D1; '02- post-stent stenosis - 2.5 x 8 Pixel BMS; '8\03: ISR/Thrombosis into native D1 - AngioJet, 2.5 x 13 Pixel; '04 - ISR 95% - covered stented area with Taxus DES 2.5 mm x 20 (2.88)  . PERCUTANEOUS CORONARY STENT INTERVENTION (PCI-S)  08/23/2013   Procedure: PERCUTANEOUS CORONARY STENT INTERVENTION (PCI-S);  Surgeon: Wellington Hampshire, MD;  Location:  [place] CATH LAB;  Service: Cardiovascular;;mid SVG-D1 80%; distal stent ~50% ISR; Promus [place] DES 2.75 mm xc 20 mm (2.8 mm)  . PORT-A-CATH REMOVAL     kidney  . TRANSTHORACIC ECHOCARDIOGRAM  02/2016   EF 55-60%. Septal dyssynergy from CABG GR 2 DD. Aortic valve is trileaflet the functional bicuspid with sclerosis but not yet notable for stenosis stenosis.; no Significant change  . TUBAL LIGATION    . wrist fistula repair Left    dialysis for one year    OB History   None      Home Medications    Prior to Admission medications   Medication Sig Start Date End Date Taking? Authorizing Provider  acetaminophen (TYLENOL) 500 MG tablet Take 1 tablet (500 mg total) by mouth every 6 (six) hours as needed for moderate pain or headache. Patient taking differently: Take 1,000 mg by mouth every 6 (six) hours as needed for moderate pain or headache.  10/17/16  Yes Arrien, Jimmy Picket, MD  albuterol (VENTOLIN HFA) 108 (90 Base) MCG/ACT inhaler INHALE 2 PUFFS INTO THE LUNGS EVERY 6 (SIX) HOURS AS NEEDED FOR WHEEZING OR SHORTNESS OF BREATH. 06/14/17  Yes Collene Gobble, MD  azaTHIOprine (IMURAN) 50 MG tablet Take 125 mg by mouth every evening.    Yes [provider]  budesonide-formoterol (SYMBICORT) 160-4.5 MCG/ACT inhaler Inhale 2 puffs into the lungs 2 (two) times daily. 10/07/17  Yes Collene Gobble, MD   calcitRIOL (ROCALTROL) 0.25 MCG capsule Take 0.25 mcg by mouth every 3 (three) days.    Yes [provider]  carbamazepine (TEGRETOL) 200 MG tablet TAKE 200 MG BY MOUTH TWICE A DAY 01/11/18  Yes Meredith Staggers, MD  clopidogrel (PLAVIX) 75 MG tablet Take 1 tablet (75 mg total) by mouth daily. 01/26/17  Yes [name], MD  clotrimazole [facility]) 10 MG troche Take 1 tablet (10 mg total) by mouth 5 (five) times daily. Patient taking differently: Take 10 mg by mouth daily as needed (thrush).  10/21/16  Yes Domenic Polite, MD  cyclobenzaprine (FLEXERIL) 10 MG tablet TAKE 1 TABLET BY MOUTH 3 TIMES DAILY AS NEEDED FOR MUSCLE SPASMS Patient taking differently: Take 10 mg by mouth twice daily 10/19/17  Yes Meredith Staggers, MD  diltiazem (CARDIZEM CD) 120 MG 24 hr capsule TAKE 1 CAPSULE (120 MG TOTAL) BY MOUTH DAILY. NEED OV. 02/22/17  Yes [name], MD  DULoxetine (CYMBALTA) 30 MG capsule Take 2 capsules (60 mg total) by mouth every morning. 12/31/17  Yes Meredith Staggers, MD  erythromycin base (E-MYCIN) 500 MG tablet Take 500 mg by mouth daily.   Yes [provider]  famotidine (PEPCID) 20 MG tablet Take 20 mg by mouth daily. 05/19/17  Yes [provider]  ferrous sulfate 325 (65 FE) MG tablet Take 1 tablet (325 mg total) by mouth 3 (three) times daily with meals. Patient taking differently: Take 325 mg by mouth 2 (two) times daily.  10/17/16  Yes Arrien, Jimmy Picket, MD  fluticasone [facility]) 50 MCG/ACT nasal spray Place 2 sprays into both nostrils 2 (two) times daily. 12/13/17  Yes Collene Gobble, MD  glimepiride (AMARYL) 4 MG tablet Take 4 mg by mouth every morning. 04/20/16  Yes [provider]  insulin glargine (LANTUS) 100 UNIT/ML injection Inject 12 Units into the skin 2 (two) times daily as needed (for BS of > 180).    Yes [provider]  loperamide (IMODIUM A-D) 2 MG tablet Take 4 mg  by mouth 2 (two) times daily.   Yes [provider]   LORazepam (ATIVAN) 1 MG tablet Take 1 mg by mouth every morning.    Yes [provider]  metFORMIN (GLUCOPHAGE) 500 MG tablet Take 500 mg by mouth 2 (two) times daily. 10/03/16  Yes [provider]  metoprolol tartrate (LOPRESSOR) 25 MG tablet TAKE 1/2 TABLET BY MOUTH DAILY ( TAKE A WHOLE TABLET DAILY IF SBP>150) Patient taking differently: Take 25 mg by mouth daily.  11/23/17  Yes [name], MD  montelukast (SINGULAIR) 10 MG tablet TAKE 1 TABLET BY MOUTH EVERY DAY 01/11/18  Yes Byrum, Rose Fillers, MD  nitroGLYCERIN (NITROSTAT) 0.4 MG SL tablet Place 1 tablet (0.4 mg total) under the tongue every 5 (five) minutes as needed for chest pain. 09/11/13  Yes [name], MD  omeprazole (PRILOSEC) 40 MG capsule Take 40 mg by mouth every morning.    Yes [provider]  oxyCODONE (OXY IR/ROXICODONE) 5 MG immediate release tablet Take 1 tablet (5 mg total) by mouth every 8 (eight) hours as needed for severe pain. 01/24/18  Yes Bayard Hugger, NP  predniSONE (DELTASONE) 5 MG tablet Take 5 mg by mouth daily with breakfast.   Yes [provider]  promethazine (PHENERGAN) 25 MG tablet Take 25 mg by mouth at bedtime as needed for nausea or vomiting (or sleep).    Yes [provider]  rosuvastatin (CRESTOR) 20 MG tablet Take 1 tablet (20 mg total) by mouth daily. 10/25/17  Yes [name], MD  sertraline (ZOLOFT) 100 MG tablet Take 100 mg by mouth every morning.   Yes [provider]  SYMBICORT 160-4.5 MCG/ACT inhaler TAKE 1 PUFF BY MOUTH TWICE A DAY 01/11/18  Yes Byrum, Rose Fillers, MD    Family History Family History  Problem Relation Age of Onset  . Cancer Mother        liver  . Heart disease Father   . Cancer Father        colon  . Arrhythmia Brother        Atrial Fibrillation  . Arrhythmia Paternal Aunt        Atrial Fibrillation    Social History Social History   Tobacco Use  . Smoking status: Former Smoker    Packs/day: 1.00     Years: 30.00    Pack years: 30.00    Types: Cigarettes    Last attempt to quit: 08/17/2002    Years since quitting: 15.4  . Smokeless tobacco: Never Used  Substance Use Topics  . Alcohol use: No  . Drug use: No     Allergies   Tetracycline; Hydromorphone hcl; Niacin; Niaspan [niacin er]; Sulfa antibiotics; Sulfonamide derivatives; Codeine; Erythromycin; Hydromorphone hcl; Morphine and related; Nalbuphine; Sulfasalazine; and Tape   Review of Systems Review of Systems  Constitutional: Positive for fatigue. Negative for chills and fever.  HENT: Negative for ear pain and sore throat.   Eyes: Negative for pain and visual disturbance.  Respiratory: Negative for cough and shortness of breath.   Cardiovascular: Negative for chest pain and palpitations.  Gastrointestinal: Negative for abdominal pain and vomiting.  Genitourinary: Negative for dysuria and hematuria.  Musculoskeletal: Negative for arthralgias and back pain.  Skin: Positive for wound. Negative for color change and rash.  Neurological: Negative for seizures and syncope.  All other systems reviewed and are negative.    Physical Exam Triage Vital Signs ED Triage Vitals  Enc Vitals Group  BP 01/25/18 1423 125/76     Pulse --      Resp --      Temp 01/25/18 1423 98.2 F (36.8 C)     Temp Source 01/25/18 1423 Oral     SpO2 01/25/18 1423 97 %     Weight --      Height --      Head Circumference --      Peak Flow --      Pain Score 01/25/18 1424 10     Pain Loc --      Pain Edu? --      Excl. in [place]? --    No data found.  Updated Vital Signs BP 125/76 (BP Location: Right Wrist)   Temp 98.2 F (36.8 C) (Oral)   SpO2 97%       Physical Exam  Constitutional: She appears well-developed and well-nourished. No distress.  HENT:  Head: Normocephalic and atraumatic.  Mouth/Throat: Oropharynx is clear and moist.  Eyes: Pupils are equal, round, and reactive to light. Conjunctivae are normal.  Neck: Normal range of  motion.  Cardiovascular: Normal rate.  Pulmonary/Chest: Effort normal. No respiratory distress.  Abdominal: Soft. She exhibits no distension.  Musculoskeletal: Normal range of motion. She exhibits no edema.  Neurological: She is alert.  Skin: Skin is warm and dry.  Granuloma present on right thumb.  Psychiatric: She has a normal mood and affect. Her behavior is normal.       UC Treatments / Results  Labs (all labs ordered are listed, but only abnormal results are displayed) Labs Reviewed  AEROBIC CULTURE (SUPERFICIAL SPECIMEN)    EKG None  Radiology No results found.  Procedures Procedures (including critical care time)  Medications Ordered in UC Medications  lidocaine-EPINEPHrine-tetracaine (LET) solution (3 mLs Topical Given 01/25/18 1448)    Initial Impression / Assessment and Plan / UC Course  I have reviewed the triage vital signs and the nursing notes.  Pertinent labs & imaging results that were available during my care of the patient were reviewed by me and considered in my medical decision making (see chart for details).     Area cleansed and treated with topical lidocaine.  Treated with silver nitrate sticks, dry dressing Final Clinical Impressions(s) / UC Diagnoses   Final diagnoses:  Visit for wound check  Granuloma due to infection     Discharge Instructions     Try to keep area dry, clean and put gauze dressing once a day Take the antibiotic 2 x a day Come back in 3 days to see me in follow up Return sooner for swelling, pain, redness or increased symptoms   ED Prescriptions    None     Controlled Substance Prescriptions [place] Controlled Substance Registry consulted? Not Applicable   Raylene Everts, MD 01/25/18 [phone]

## 2018-01-26 ENCOUNTER — Other Ambulatory Visit: Payer: Self-pay | Admitting: Physical Medicine & Rehabilitation

## 2018-01-26 DIAGNOSIS — M797 Fibromyalgia: Secondary | ICD-10-CM

## 2018-01-26 DIAGNOSIS — E1142 Type 2 diabetes mellitus with diabetic polyneuropathy: Secondary | ICD-10-CM

## 2018-01-28 ENCOUNTER — Other Ambulatory Visit: Payer: Self-pay

## 2018-01-28 ENCOUNTER — Telehealth (HOSPITAL_COMMUNITY): Payer: Self-pay

## 2018-01-28 ENCOUNTER — Encounter (HOSPITAL_COMMUNITY): Payer: Self-pay | Admitting: Emergency Medicine

## 2018-01-28 ENCOUNTER — Inpatient Hospital Stay (HOSPITAL_COMMUNITY)
Admission: EM | Admit: 2018-01-28 | Discharge: 2018-02-02 | DRG: 580 | Disposition: A | Discharge status: Home / Self Care | Payer: Medicare Other | Attending: Internal Medicine | Admitting: Internal Medicine

## 2018-01-28 ENCOUNTER — Emergency Department (HOSPITAL_COMMUNITY): Payer: Medicare Other

## 2018-01-28 DIAGNOSIS — Z800 Family history of malignant neoplasm of digestive organs: Secondary | ICD-10-CM

## 2018-01-28 DIAGNOSIS — Z951 Presence of aortocoronary bypass graft: Secondary | ICD-10-CM

## 2018-01-28 DIAGNOSIS — R531 Weakness: Secondary | ICD-10-CM | POA: Diagnosis not present

## 2018-01-28 DIAGNOSIS — Z9884 Bariatric surgery status: Secondary | ICD-10-CM

## 2018-01-28 DIAGNOSIS — E1169 Type 2 diabetes mellitus with other specified complication: Secondary | ICD-10-CM | POA: Diagnosis present

## 2018-01-28 DIAGNOSIS — S61001A Unspecified open wound of right thumb without damage to nail, initial encounter: Secondary | ICD-10-CM | POA: Diagnosis not present

## 2018-01-28 DIAGNOSIS — Z885 Allergy status to narcotic agent status: Secondary | ICD-10-CM

## 2018-01-28 DIAGNOSIS — F418 Other specified anxiety disorders: Secondary | ICD-10-CM | POA: Diagnosis present

## 2018-01-28 DIAGNOSIS — Z940 Kidney transplant status: Secondary | ICD-10-CM | POA: Diagnosis not present

## 2018-01-28 DIAGNOSIS — E1159 Type 2 diabetes mellitus with other circulatory complications: Secondary | ICD-10-CM | POA: Diagnosis present

## 2018-01-28 DIAGNOSIS — E669 Obesity, unspecified: Secondary | ICD-10-CM | POA: Diagnosis present

## 2018-01-28 DIAGNOSIS — Z9049 Acquired absence of other specified parts of digestive tract: Secondary | ICD-10-CM | POA: Diagnosis not present

## 2018-01-28 DIAGNOSIS — C4911 Malignant neoplasm of connective and soft tissue of right upper limb, including shoulder: Secondary | ICD-10-CM | POA: Diagnosis not present

## 2018-01-28 DIAGNOSIS — G4733 Obstructive sleep apnea (adult) (pediatric): Secondary | ICD-10-CM | POA: Diagnosis present

## 2018-01-28 DIAGNOSIS — I252 Old myocardial infarction: Secondary | ICD-10-CM | POA: Diagnosis not present

## 2018-01-28 DIAGNOSIS — Z8673 Personal history of transient ischemic attack (TIA), and cerebral infarction without residual deficits: Secondary | ICD-10-CM

## 2018-01-28 DIAGNOSIS — L089 Local infection of the skin and subcutaneous tissue, unspecified: Secondary | ICD-10-CM | POA: Diagnosis not present

## 2018-01-28 DIAGNOSIS — E1142 Type 2 diabetes mellitus with diabetic polyneuropathy: Secondary | ICD-10-CM | POA: Diagnosis present

## 2018-01-28 DIAGNOSIS — D492 Neoplasm of unspecified behavior of bone, soft tissue, and skin: Secondary | ICD-10-CM | POA: Diagnosis not present

## 2018-01-28 DIAGNOSIS — E785 Hyperlipidemia, unspecified: Secondary | ICD-10-CM | POA: Diagnosis present

## 2018-01-28 DIAGNOSIS — I10 Essential (primary) hypertension: Secondary | ICD-10-CM | POA: Diagnosis not present

## 2018-01-28 DIAGNOSIS — Z7951 Long term (current) use of inhaled steroids: Secondary | ICD-10-CM

## 2018-01-28 DIAGNOSIS — Z7952 Long term (current) use of systemic steroids: Secondary | ICD-10-CM

## 2018-01-28 DIAGNOSIS — J209 Acute bronchitis, unspecified: Secondary | ICD-10-CM | POA: Diagnosis not present

## 2018-01-28 DIAGNOSIS — L03011 Cellulitis of right finger: Principal | ICD-10-CM | POA: Diagnosis present

## 2018-01-28 DIAGNOSIS — I480 Paroxysmal atrial fibrillation: Secondary | ICD-10-CM | POA: Diagnosis not present

## 2018-01-28 DIAGNOSIS — M797 Fibromyalgia: Secondary | ICD-10-CM | POA: Diagnosis present

## 2018-01-28 DIAGNOSIS — Z6841 Body Mass Index (BMI) 40.0 and over, adult: Secondary | ICD-10-CM

## 2018-01-28 DIAGNOSIS — I2510 Atherosclerotic heart disease of native coronary artery without angina pectoris: Secondary | ICD-10-CM | POA: Diagnosis present

## 2018-01-28 DIAGNOSIS — R42 Dizziness and giddiness: Secondary | ICD-10-CM | POA: Diagnosis not present

## 2018-01-28 DIAGNOSIS — Z87891 Personal history of nicotine dependence: Secondary | ICD-10-CM

## 2018-01-28 DIAGNOSIS — K219 Gastro-esophageal reflux disease without esophagitis: Secondary | ICD-10-CM | POA: Diagnosis present

## 2018-01-28 DIAGNOSIS — Z882 Allergy status to sulfonamides status: Secondary | ICD-10-CM

## 2018-01-28 DIAGNOSIS — I152 Hypertension secondary to endocrine disorders: Secondary | ICD-10-CM | POA: Diagnosis present

## 2018-01-28 DIAGNOSIS — Z794 Long term (current) use of insulin: Secondary | ICD-10-CM

## 2018-01-28 DIAGNOSIS — M069 Rheumatoid arthritis, unspecified: Secondary | ICD-10-CM | POA: Diagnosis present

## 2018-01-28 DIAGNOSIS — I6521 Occlusion and stenosis of right carotid artery: Secondary | ICD-10-CM | POA: Diagnosis present

## 2018-01-28 DIAGNOSIS — Z79899 Other long term (current) drug therapy: Secondary | ICD-10-CM | POA: Diagnosis not present

## 2018-01-28 DIAGNOSIS — Z87442 Personal history of urinary calculi: Secondary | ICD-10-CM

## 2018-01-28 DIAGNOSIS — Z7902 Long term (current) use of antithrombotics/antiplatelets: Secondary | ICD-10-CM | POA: Diagnosis not present

## 2018-01-28 DIAGNOSIS — Z955 Presence of coronary angioplasty implant and graft: Secondary | ICD-10-CM | POA: Diagnosis not present

## 2018-01-28 DIAGNOSIS — I454 Nonspecific intraventricular block: Secondary | ICD-10-CM | POA: Diagnosis not present

## 2018-01-28 DIAGNOSIS — M1990 Unspecified osteoarthritis, unspecified site: Secondary | ICD-10-CM | POA: Diagnosis not present

## 2018-01-28 DIAGNOSIS — Z8249 Family history of ischemic heart disease and other diseases of the circulatory system: Secondary | ICD-10-CM

## 2018-01-28 DIAGNOSIS — E1151 Type 2 diabetes mellitus with diabetic peripheral angiopathy without gangrene: Secondary | ICD-10-CM | POA: Diagnosis present

## 2018-01-28 DIAGNOSIS — R079 Chest pain, unspecified: Secondary | ICD-10-CM | POA: Diagnosis not present

## 2018-01-28 DIAGNOSIS — D638 Anemia in other chronic diseases classified elsewhere: Secondary | ICD-10-CM | POA: Diagnosis present

## 2018-01-28 DIAGNOSIS — Z881 Allergy status to other antibiotic agents status: Secondary | ICD-10-CM

## 2018-01-28 DIAGNOSIS — G4730 Sleep apnea, unspecified: Secondary | ICD-10-CM | POA: Diagnosis present

## 2018-01-28 DIAGNOSIS — Z91048 Other nonmedicinal substance allergy status: Secondary | ICD-10-CM

## 2018-01-28 LAB — CBC WITH DIFFERENTIAL/PLATELET
ABS IMMATURE GRANULOCYTES: 0 10*3/uL (ref 0.0–0.1)
Basophils Absolute: 0 10*3/uL (ref 0.0–0.1)
Basophils Relative: 0 %
Eosinophils Absolute: 0.1 10*3/uL (ref 0.0–0.7)
Eosinophils Relative: 3 %
HCT: 31.2 % — ABNORMAL LOW (ref 36.0–46.0)
HEMOGLOBIN: 10.1 g/dL — AB (ref 12.0–15.0)
IMMATURE GRANULOCYTES: 0 %
LYMPHS ABS: 0.8 10*3/uL (ref 0.7–4.0)
LYMPHS PCT: 23 %
MCH: 31.3 pg (ref 26.0–34.0)
MCHC: 32.4 g/dL (ref 30.0–36.0)
MCV: 96.6 fL (ref 78.0–100.0)
MONO ABS: 0.6 10*3/uL (ref 0.1–1.0)
MONOS PCT: 18 %
NEUTROS ABS: 2 10*3/uL (ref 1.7–7.7)
NEUTROS PCT: 56 %
Platelets: 187 10*3/uL (ref 150–400)
RBC: 3.23 MIL/uL — ABNORMAL LOW (ref 3.87–5.11)
RDW: 14.3 % (ref 11.5–15.5)
WBC: 3.5 10*3/uL — ABNORMAL LOW (ref 4.0–10.5)

## 2018-01-28 LAB — URINALYSIS, ROUTINE W REFLEX MICROSCOPIC
Bilirubin Urine: NEGATIVE
Glucose, UA: NEGATIVE mg/dL
Hgb urine dipstick: NEGATIVE
KETONES UR: NEGATIVE mg/dL
LEUKOCYTES UA: NEGATIVE
NITRITE: NEGATIVE
Protein, ur: NEGATIVE mg/dL
SPECIFIC GRAVITY, URINE: 1.015 (ref 1.005–1.030)
pH: 5 (ref 5.0–8.0)

## 2018-01-28 LAB — BASIC METABOLIC PANEL
Anion gap: 11 (ref 5–15)
BUN: 12 mg/dL (ref 6–20)
CHLORIDE: 107 mmol/L (ref 101–111)
CO2: 20 mmol/L — AB (ref 22–32)
CREATININE: 0.93 mg/dL (ref 0.44–1.00)
Calcium: 8.6 mg/dL — ABNORMAL LOW (ref 8.9–10.3)
GFR calc Af Amer: >60 mL/min (ref >60)
GFR calc non Af Amer: >60 mL/min (ref >60)
GLUCOSE: 172 mg/dL — AB (ref 65–99)
Potassium: 3.9 mmol/L (ref 3.5–5.1)
Sodium: 138 mmol/L (ref 135–145)

## 2018-01-28 LAB — HEMOGLOBIN A1C
Hgb A1c MFr Bld: 7.1 % — ABNORMAL HIGH (ref 4.8–5.6)
Mean Plasma Glucose: 157.07 mg/dL

## 2018-01-28 LAB — I-STAT TROPONIN, ED: Troponin i, poc: 0.01 ng/mL (ref 0.00–0.08)

## 2018-01-28 LAB — AEROBIC CULTURE W GRAM STAIN (SUPERFICIAL SPECIMEN)

## 2018-01-28 LAB — AEROBIC CULTURE  (SUPERFICIAL SPECIMEN)

## 2018-01-28 LAB — GLUCOSE, CAPILLARY: GLUCOSE-CAPILLARY: 151 mg/dL — AB (ref 65–99)

## 2018-01-28 LAB — I-STAT CG4 LACTIC ACID, ED: LACTIC ACID, VENOUS: 2.71 mmol/L — AB (ref 0.5–1.9)

## 2018-01-28 LAB — CG4 I-STAT (LACTIC ACID): LACTIC ACID, VENOUS: 1.57 mmol/L (ref 0.5–1.9)

## 2018-01-28 MED ORDER — OXYCODONE HCL 5 MG PO TABS
5.0000 mg | ORAL_TABLET | Freq: Three times a day (TID) | ORAL | Status: DC | PRN
  Administered 2018-01-28 – 2018-02-02 (×6): 5 mg via ORAL
  Filled 2018-01-28 (×6): qty 1

## 2018-01-28 MED ORDER — GLIMEPIRIDE 4 MG PO TABS
4.0000 mg | ORAL_TABLET | Freq: Every morning | ORAL | Status: DC
  Administered 2018-01-29: 4 mg via ORAL
  Filled 2018-01-28: qty 1

## 2018-01-28 MED ORDER — METOPROLOL TARTRATE 25 MG PO TABS
25.0000 mg | ORAL_TABLET | Freq: Every day | ORAL | Status: DC
  Administered 2018-01-29 – 2018-02-02 (×5): 25 mg via ORAL
  Filled 2018-01-28 (×5): qty 1

## 2018-01-28 MED ORDER — ONDANSETRON HCL 4 MG PO TABS
4.0000 mg | ORAL_TABLET | Freq: Four times a day (QID) | ORAL | Status: DC | PRN
  Filled 2018-01-28: qty 1

## 2018-01-28 MED ORDER — PIPERACILLIN-TAZOBACTAM 3.375 G IVPB
3.3750 g | Freq: Three times a day (TID) | INTRAVENOUS | Status: DC
Start: 2018-01-28 — End: 2018-01-29
  Administered 2018-01-28 – 2018-01-29 (×2): 3.375 g via INTRAVENOUS
  Filled 2018-01-28 (×3): qty 50

## 2018-01-28 MED ORDER — CALCITRIOL 0.25 MCG PO CAPS
0.2500 ug | ORAL_CAPSULE | ORAL | Status: DC
Start: 2018-01-28 — End: 2018-02-02
  Administered 2018-01-28 – 2018-01-31 (×2): 0.25 ug via ORAL
  Filled 2018-01-28: qty 1

## 2018-01-28 MED ORDER — ROSUVASTATIN CALCIUM 10 MG PO TABS
20.0000 mg | ORAL_TABLET | Freq: Every day | ORAL | Status: DC
  Administered 2018-01-29 – 2018-02-02 (×5): 20 mg via ORAL
  Filled 2018-01-28 (×4): qty 2
  Filled 2018-01-28: qty 1
  Filled 2018-01-28: qty 2

## 2018-01-28 MED ORDER — CARBAMAZEPINE 200 MG PO TABS
200.0000 mg | ORAL_TABLET | Freq: Two times a day (BID) | ORAL | Status: DC
  Administered 2018-01-28 – 2018-02-02 (×10): 200 mg via ORAL
  Filled 2018-01-28 (×10): qty 1

## 2018-01-28 MED ORDER — INSULIN GLARGINE 100 UNIT/ML ~~LOC~~ SOLN
12.0000 [IU] | Freq: Every day | SUBCUTANEOUS | Status: DC
  Administered 2018-01-29 – 2018-02-02 (×5): 12 [IU] via SUBCUTANEOUS
  Filled 2018-01-28 (×5): qty 0.12

## 2018-01-28 MED ORDER — NITROGLYCERIN 0.4 MG SL SUBL: 0.4000 mg | SUBLINGUAL_TABLET | SUBLINGUAL | Status: DC | PRN

## 2018-01-28 MED ORDER — LORAZEPAM 1 MG PO TABS
1.0000 mg | ORAL_TABLET | Freq: Every morning | ORAL | Status: DC
  Administered 2018-01-29 – 2018-02-02 (×5): 1 mg via ORAL
  Filled 2018-01-28 (×5): qty 1

## 2018-01-28 MED ORDER — ERYTHROMYCIN BASE 250 MG PO TABS
500.0000 mg | ORAL_TABLET | Freq: Every day | ORAL | Status: DC
  Administered 2018-01-28 – 2018-01-30 (×3): 500 mg via ORAL
  Filled 2018-01-28 (×3): qty 2

## 2018-01-28 MED ORDER — VANCOMYCIN HCL IN DEXTROSE 1-5 GM/200ML-% IV SOLN: 1000.0000 mg | Freq: Once | INTRAVENOUS | Status: DC

## 2018-01-28 MED ORDER — CLOPIDOGREL BISULFATE 75 MG PO TABS
75.0000 mg | ORAL_TABLET | Freq: Every day | ORAL | Status: DC
  Administered 2018-01-29 – 2018-02-02 (×5): 75 mg via ORAL
  Filled 2018-01-28 (×5): qty 1

## 2018-01-28 MED ORDER — PANTOPRAZOLE SODIUM 40 MG PO TBEC
40.0000 mg | DELAYED_RELEASE_TABLET | Freq: Every day | ORAL | Status: DC
  Administered 2018-01-29 – 2018-02-02 (×5): 40 mg via ORAL
  Filled 2018-01-28 (×5): qty 1

## 2018-01-28 MED ORDER — PROMETHAZINE HCL 25 MG PO TABS: 25.0000 mg | ORAL_TABLET | Freq: Every evening | ORAL | Status: DC | PRN

## 2018-01-28 MED ORDER — FAMOTIDINE 20 MG PO TABS
20.0000 mg | ORAL_TABLET | Freq: Every day | ORAL | Status: DC
  Administered 2018-01-28 – 2018-02-02 (×6): 20 mg via ORAL
  Filled 2018-01-28 (×6): qty 1

## 2018-01-28 MED ORDER — FERROUS SULFATE 325 (65 FE) MG PO TABS
325.0000 mg | ORAL_TABLET | Freq: Two times a day (BID) | ORAL | Status: DC
  Administered 2018-01-28 – 2018-02-02 (×10): 325 mg via ORAL
  Filled 2018-01-28 (×10): qty 1

## 2018-01-28 MED ORDER — BASAGLAR KWIKPEN 100 UNIT/ML ~~LOC~~ SOPN: 12.0000 [IU] | PEN_INJECTOR | Freq: Every day | SUBCUTANEOUS | Status: DC

## 2018-01-28 MED ORDER — ACETAMINOPHEN 500 MG PO TABS
500.0000 mg | ORAL_TABLET | Freq: Four times a day (QID) | ORAL | Status: DC | PRN
  Administered 2018-01-30 – 2018-02-02 (×4): 500 mg via ORAL
  Filled 2018-01-28 (×4): qty 1

## 2018-01-28 MED ORDER — LOPERAMIDE HCL 2 MG PO TABS: 4.0000 mg | ORAL_TABLET | Freq: Two times a day (BID) | ORAL | Status: DC

## 2018-01-28 MED ORDER — AZATHIOPRINE 50 MG PO TABS
125.0000 mg | ORAL_TABLET | Freq: Every evening | ORAL | Status: DC
  Administered 2018-01-28: 125 mg via ORAL
  Filled 2018-01-28 (×2): qty 3

## 2018-01-28 MED ORDER — DILTIAZEM HCL ER COATED BEADS 120 MG PO CP24
120.0000 mg | ORAL_CAPSULE | Freq: Every day | ORAL | Status: DC
  Administered 2018-01-29 – 2018-02-02 (×5): 120 mg via ORAL
  Filled 2018-01-28 (×5): qty 1

## 2018-01-28 MED ORDER — SODIUM CHLORIDE 0.9 % IV BOLUS: 1000.0000 mL | Freq: Once | INTRAVENOUS | Status: DC

## 2018-01-28 MED ORDER — ALBUTEROL SULFATE (2.5 MG/3ML) 0.083% IN NEBU: 3.0000 mL | INHALATION_SOLUTION | Freq: Four times a day (QID) | RESPIRATORY_TRACT | Status: DC | PRN

## 2018-01-28 MED ORDER — ONDANSETRON HCL 4 MG/2ML IJ SOLN
4.0000 mg | Freq: Four times a day (QID) | INTRAMUSCULAR | Status: DC | PRN
  Administered 2018-01-28 – 2018-01-29 (×2): 4 mg via INTRAVENOUS
  Filled 2018-01-28: qty 2

## 2018-01-28 MED ORDER — VANCOMYCIN HCL 10 G IV SOLR
1250.0000 mg | Freq: Two times a day (BID) | INTRAVENOUS | Status: DC
  Administered 2018-01-28 – 2018-01-31 (×7): 1250 mg via INTRAVENOUS
  Filled 2018-01-28 (×8): qty 1250

## 2018-01-28 MED ORDER — CYCLOBENZAPRINE HCL 10 MG PO TABS
5.0000 mg | ORAL_TABLET | Freq: Two times a day (BID) | ORAL | Status: DC | PRN
  Administered 2018-01-31 – 2018-02-02 (×3): 5 mg via ORAL
  Filled 2018-01-28 (×3): qty 1

## 2018-01-28 MED ORDER — SERTRALINE HCL 100 MG PO TABS
100.0000 mg | ORAL_TABLET | Freq: Every morning | ORAL | Status: DC
  Administered 2018-01-29 – 2018-02-02 (×5): 100 mg via ORAL
  Filled 2018-01-28 (×5): qty 1

## 2018-01-28 MED ORDER — DULOXETINE HCL 60 MG PO CPEP
60.0000 mg | ORAL_CAPSULE | Freq: Every morning | ORAL | Status: DC
  Administered 2018-01-29 – 2018-02-02 (×5): 60 mg via ORAL
  Filled 2018-01-28 (×5): qty 1

## 2018-01-28 MED ORDER — PROMETHAZINE HCL 25 MG/ML IJ SOLN
12.5000 mg | Freq: Once | INTRAMUSCULAR | Status: AC
Start: 2018-01-28 — End: 2018-01-28
  Administered 2018-01-28: 12.5 mg via INTRAVENOUS
  Filled 2018-01-28: qty 1

## 2018-01-28 MED ORDER — POTASSIUM CHLORIDE IN NACL 20-0.9 MEQ/L-% IV SOLN
INTRAVENOUS | Status: AC
  Administered 2018-01-28: 19:00:00 via INTRAVENOUS
  Filled 2018-01-28: qty 1000

## 2018-01-28 MED ORDER — MONTELUKAST SODIUM 10 MG PO TABS
10.0000 mg | ORAL_TABLET | Freq: Every day | ORAL | Status: DC
  Administered 2018-01-29 – 2018-02-01 (×4): 10 mg via ORAL
  Filled 2018-01-28 (×5): qty 1

## 2018-01-28 MED ORDER — METFORMIN HCL 500 MG PO TABS
500.0000 mg | ORAL_TABLET | Freq: Two times a day (BID) | ORAL | Status: DC
  Administered 2018-01-29: 500 mg via ORAL
  Filled 2018-01-28: qty 1

## 2018-01-28 MED ORDER — FLUTICASONE PROPIONATE 50 MCG/ACT NA SUSP
2.0000 | Freq: Two times a day (BID) | NASAL | Status: DC
  Administered 2018-01-28 – 2018-02-02 (×9): 2 via NASAL
  Filled 2018-01-28 (×2): qty 16

## 2018-01-28 MED ORDER — PREDNISONE 5 MG PO TABS
5.0000 mg | ORAL_TABLET | Freq: Every day | ORAL | Status: DC
  Administered 2018-01-29 – 2018-02-02 (×5): 5 mg via ORAL
  Filled 2018-01-28 (×5): qty 1

## 2018-01-28 MED ORDER — PIPERACILLIN-TAZOBACTAM 3.375 G IVPB 30 MIN
3.3750 g | Freq: Once | INTRAVENOUS | Status: AC
  Administered 2018-01-28: 3.375 g via INTRAVENOUS
  Filled 2018-01-28: qty 50

## 2018-01-28 MED ORDER — FLUCONAZOLE IN SODIUM CHLORIDE 200-0.9 MG/100ML-% IV SOLN
200.0000 mg | INTRAVENOUS | Status: DC
  Administered 2018-01-28 – 2018-01-29 (×2): 200 mg via INTRAVENOUS
  Filled 2018-01-28 (×3): qty 100

## 2018-01-28 MED ORDER — MOMETASONE FURO-FORMOTEROL FUM 200-5 MCG/ACT IN AERO
2.0000 | INHALATION_SPRAY | Freq: Two times a day (BID) | RESPIRATORY_TRACT | Status: DC
  Administered 2018-01-29 – 2018-02-02 (×7): 2 via RESPIRATORY_TRACT
  Filled 2018-01-28 (×2): qty 8.8

## 2018-01-28 NOTE — H&P (Signed)
History and Physical    Katherine [name] DDU:[id number] DOB: [birth year] DOA: 01/28/2018  PCP: [name], MD  Patient coming from: Home  I have personally briefly reviewed patient's old medical records in [place]  Chief Complaint: Infected right thumb  HPI: Katherine [name] is a 62 y.o. female with medical history significant of coronary artery disease status post CABG, type 2 diabetes, morbid obesity status post lap band surgery, kidney transplant and bilateral carotid artery stenosis who presents with right thumb pain and worsening infection and erythema.  She started off having small callus on her thumb on the right hand and it has progressively worsened.  She went to urgent care a couple of days ago where the area was debrided and doxycycline was given to her.  A culture was obtained at that time.  Showed some moderate gram-positive cocci and yeast but there were multiple organisms present.  Patient has noticed some on the proximal aspect of her hand since and she has had some slight lymphangitis to the wrist.  She has had subjective fevers and chills but has not taken her temperature at home.  She takes prednisone daily and therefore her temperatures may not be reliable.  She states that the thumb wound bleeds often and she has a cough for about a month which and has been having hemoptysis.  That occurred last week and has resolved.  She still has some cough but chest x-ray obtained in the emergency department was unremarkable.  She says she is becoming weaker and weaker.  Has been short of breath.  This morning since waking up she said she is been having some chest burning that constant but nothing making it worse or better and it similar to when she had pneumonia in the past. ED Course: Given vancomycin Zosyn and fluconazole, lactate was noted to be 3.7 her white blood cell count slightly low at 3.5.  She has been given IV fluids to treat the lactate.  Dr. Lenon Curt from orthopedic  surgery has been consulted and will see the patient as well.  Chest x-ray did not show any pneumonia however antibiotics she is receiving would cover that if it were present.  Review of Systems: As per HPI otherwise all other systems reviewed and  negative.     Past Medical History:  Diagnosis Date  . Anemia   . Anxiety   . Bilateral carotid artery stenosis    Carotid duplex 09/3760: 8-31% LICA, 51-76% RICA, >16% RECA, f/u 1 yr suggested  . CAD (coronary artery disease) of bypass graft 5/01; 3/'02, 8/'03, 10/'04; 1/15   PCI x 5 to SVG-D1   . CAD in native artery 07/1993    3 Vessel Disease (LAD-D1 & RCA) -- CABG  . CAD S/P percutaneous coronary angioplasty    PCI to SVG-D1 insertion/native D1 x 4 = '01 -(S660 BMS 2.5 x 9 - insertion into D1; '02 - distal overlap ACS Pixel 2.5 x 8  BMS; '03 distal/native ISR/Thrombosis - Pixel 2.5 x 13; '04 - ISR-  Taxus 2.5 x 20 (covered all);; 1/15 - mid SVG-D1 (50% distal ISR) - Promus P 2.75 x 20 -- 2.8 mm  . COPD mixed type ([place])    Followed by Dr. Lamonte Sakai "pulmonologist said no COPD"  . Depression   . Depression with anxiety   . Diabetes mellitus type 2 in obese ([place])   . Diarrhea    started after cholecystectomy and mass removed from intestine  . Dyslipidemia, goal  LDL below 70    08/2012: TC 137, TG 200, HDL 32!, LDL 45; on statin (followed by Dr.Deterding)  . ESRD (end stage renal disease) ([place]) 1991   s/p Cadaveric Renal Transplant [facility] - Dr. Jimmy Footman)   . Family history of adverse reaction to anesthesia    mom's bp dropped during/after anesthesia  . Fibromyalgia   . GERD (gastroesophageal reflux disease)   . Glomerulonephritis, chronic, rapidly progressive 2  . 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 07/1993   Rescue PTCA of RCA -- referred for CABG.  . H/O: GI bleed   . Headache    migraines in the past  . History of kidney stones   . Hypertension associated with diabetes ([place])   . MRSA (methicillin  resistant staph aureus) culture positive   . Obesity    s/p Lap Band 04/2004- Port Replacement 10/09 & 2/10 for infection; Dr. Excell Seltzer.  . OSA (obstructive sleep apnea)    no longer on CPAP or home O2, states she doesn't need now after lap band  . PAD (peripheral artery disease) ([place]) 08/2013   LEA Dopplers to be read by Dr. Fletcher Anon  . PAF (paroxysmal atrial fibrillation) ([place]) 06/2014   Noted on CardioNet Monitor  - rate ~112; no recurrent symptoms. Nonionic regulation because of frequent GI procedures. Patient prefers Plavix  . Pneumonia   . Recurrent boils    Bilateral Groin  . Rheumatoid arthritis ([place])    Per Patient Report; associated with OA  . 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 Servando Snare: LIMA-LAD, SVG-bifurcatingD1, SVG-rPDA  . S/p cadaver renal transplant [place]  . Stroke [facility]) 2012   "right eye stroke- half blind now"  . Unstable angina ([place]) 5/01; 3/'02, 8/'03, 10/'04; 1/15   x 5 occurences since Inf-Post STEMI in 1994    Past Surgical History:  Procedure Laterality Date  . CARDIAC CATHETERIZATION  5/'01, 3/'02, 8/'03, 10/'04; 1/'15   08/22/2013: LAD & RCA 100%; LIMA-LAD & SVG-rPDA patent; Cx-- OM1 60%, OM2 ostial ~50%; SVG-D1 - 80% mid, 50% distal ISR --PCI  . CATHETER REMOVAL    . CHOLECYSTECTOMY N/A 10/29/2014   Procedure: LAPAROSCOPIC CHOLECYSTECTOMY WITH INTRAOPERATIVE CHOLANGIOGRAM;  Surgeon: Excell Seltzer, MD;  Location: [facility] ORS;  Service: General;  Laterality: N/A;  . CORONARY ANGIOPLASTY  1994   x5  . CORONARY ARTERY BYPASS GRAFT  1995   LIMA-LAD, SVG-RPDA, SVG-D1  . ESOPHAGOGASTRODUODENOSCOPY N/A 10/15/2016   Procedure: ESOPHAGOGASTRODUODENOSCOPY (EGD);  Surgeon: Wilford Corner, MD;  Location: [facility] ENDOSCOPY;  Service: Endoscopy;  Laterality: N/A;  . INCISE AND DRAIN ABCESS    . KIDNEY TRANSPLANT  1991  . KNEE ARTHROSCOPY WITH LATERAL MENISECTOMY Left 12/03/2017   Procedure: LEFT KNEE ARTHROSCOPY WITH LATERAL MENISECTOMY;  Surgeon: Earlie Server, MD;  Location: [place];  Service: Orthopedics;  Laterality: Left;  . LAPAROSCOPIC GASTRIC BANDING  04/2004; 10/'09, 2/'10   Port Replacement x 2  . LEFT HEART CATHETERIZATION WITH CORONARY/GRAFT ANGIOGRAM N/A 08/23/2013   Procedure: LEFT HEART CATHETERIZATION WITH Beatrix Fetters;  Surgeon: Wellington Hampshire, MD;  Location: [place] CATH LAB;  Service: Cardiovascular;  Laterality: N/A;  . Lower Extremity Arterial Dopplers  08/2013   ABI: R 0.96, L 1.04  . MULTIPLE TOOTH EXTRACTIONS  age 46  . NM MYOVIEW LTD  03/2016   EF 62%. LOW RISK. C/W prior MI - no Ischemia. Apical hypokinesis.  [name] Kitchen PERCUTANEOUS CORONARY STENT INTERVENTION (PCI-S)  5/'01, 3/'02, 8/'03, 10/'04;   '01 -  S660 BMS 2.5 x 9 - dSVG-D1 into D1; '02- post-stent stenosis - 2.5 x 8 Pixel BMS; '8\03: ISR/Thrombosis into native D1 - AngioJet, 2.5 x 13 Pixel; '04 - ISR 95% - covered stented area with Taxus DES 2.5 mm x 20 (2.88)  . PERCUTANEOUS CORONARY STENT INTERVENTION (PCI-S)  08/23/2013   Procedure: PERCUTANEOUS CORONARY STENT INTERVENTION (PCI-S);  Surgeon: Wellington Hampshire, MD;  Location: [facility] CATH LAB;  Service: Cardiovascular;;mid SVG-D1 80%; distal stent ~50% ISR; Promus Prermier DES 2.75 mm xc 20 mm (2.8 mm)  . PORT-A-CATH REMOVAL     kidney  . TRANSTHORACIC ECHOCARDIOGRAM  02/2016   EF 55-60%. Septal dyssynergy from CABG GR 2 DD. Aortic valve is trileaflet the functional bicuspid with sclerosis but not yet notable for stenosis stenosis.; no Significant change  . TUBAL LIGATION    . wrist fistula repair Left    dialysis for one year    Social History   Social History Narrative   She is currently married, and the caregiver of her husband who is recovering from surgery for tongue cancer now diagnosed with lung cancer. Prior to his diagnosis of her husband, she actually had adopted a 54-year-old child who she knows caring for as well. With all the surgeries, they have been quite financially troubled. Thanks the help of her community and church, they  have been able to stay "alfoat."     She is a former smoker who quit in 2004 after a 30-pack-year history.   She is active chasing a 37-year-old child, does not do routine exercise. She's been quite depressed with the condition of her husband, and admits to eating comfort herself.   She does not drink alcohol.     reports that she quit smoking about 15 years ago. Her smoking use included cigarettes. She has a 30.00 pack-year smoking history. She has never used smokeless tobacco. She reports that she does not drink alcohol or use drugs.  Allergies  Allergen Reactions  . Tetracycline Hives  . Hydromorphone Hcl Nausea And Vomiting  . Niacin Other (See Comments)    Mouth blisters  . Niaspan [Niacin Er] Other (See Comments)    Mouth blisters  . Sulfa Antibiotics Nausea Only and Other (See Comments)    "Tears up stomach"  . Sulfonamide Derivatives Other (See Comments)    Reaction: per patient "tears her stomach up"  . Codeine Nausea And Vomiting  . Erythromycin Nausea And Vomiting  . Hydromorphone Hcl Nausea And Vomiting  . Morphine And Related Nausea And Vomiting  . Nalbuphine Nausea And Vomiting  . Sulfasalazine Nausea Only and Other (See Comments)    per patient "tears her stomach up", "Tears up stomach"  . Tape Rash and Other (See Comments)    No "plastic" tape," please    Family History  Problem Relation Age of Onset  . Cancer Mother        liver  . Heart disease Father   . Cancer Father        colon  . Arrhythmia Brother        Atrial Fibrillation  . Arrhythmia Paternal Aunt        Atrial Fibrillation     Prior to Admission medications   Medication Sig Start Date End Date Taking? Authorizing Provider  acetaminophen (TYLENOL) 500 MG tablet Take 1 tablet (500 mg total) by mouth every 6 (six) hours as needed for moderate pain or headache. Patient taking differently: Take 1,000 mg by mouth every 6 (six) hours  as needed for moderate pain or headache.  10/17/16  Yes Arrien,  Jimmy Picket, MD  albuterol (VENTOLIN HFA) 108 (90 Base) MCG/ACT inhaler INHALE 2 PUFFS INTO THE LUNGS EVERY 6 (SIX) HOURS AS NEEDED FOR WHEEZING OR SHORTNESS OF BREATH. 06/14/17  Yes Collene Gobble, MD  azaTHIOprine (IMURAN) 50 MG tablet Take 125 mg by mouth every evening.    Yes [provider]  budesonide-formoterol (SYMBICORT) 160-4.5 MCG/ACT inhaler Inhale 2 puffs into the lungs 2 (two) times daily. 10/07/17  Yes Collene Gobble, MD  calcitRIOL (ROCALTROL) 0.25 MCG capsule Take 0.25 mcg by mouth every 3 (three) days.    Yes [provider]  carbamazepine (TEGRETOL) 200 MG tablet TAKE 200 MG BY MOUTH TWICE A DAY 01/11/18  Yes Meredith Staggers, MD  clopidogrel (PLAVIX) 75 MG tablet Take 1 tablet (75 mg total) by mouth daily. 01/26/17  Yes [name], MD  clotrimazole [facility]) 10 MG troche Take 1 tablet (10 mg total) by mouth 5 (five) times daily. Patient taking differently: Take 10 mg by mouth daily as needed (thrush).  10/21/16  Yes Domenic Polite, MD  cyclobenzaprine (FLEXERIL) 10 MG tablet TAKE 1 TABLET BY MOUTH 3 TIMES DAILY AS NEEDED FOR MUSCLE SPASMS Patient taking differently: Take 10 mg by mouth twice daily 10/19/17  Yes Meredith Staggers, MD  diltiazem (CARDIZEM CD) 120 MG 24 hr capsule TAKE 1 CAPSULE (120 MG TOTAL) BY MOUTH DAILY. NEED OV. 02/22/17  Yes [name], MD  doxycycline (VIBRAMYCIN) 100 MG capsule Take 1 capsule (100 mg total) by mouth 2 (two) times daily. 01/25/18  Yes Raylene Everts, MD  DULoxetine (CYMBALTA) 30 MG capsule TAKE 2 CAPSULES (60 MG TOTAL) BY MOUTH EVERY MORNING. 01/26/18  Yes Meredith Staggers, MD  erythromycin base (E-MYCIN) 500 MG tablet Take 500 mg by mouth daily.   Yes [provider]  ferrous sulfate 325 (65 FE) MG tablet Take 1 tablet (325 mg total) by mouth 3 (three) times daily with meals. Patient taking differently: Take 325 mg by mouth 2 (two) times daily.  10/17/16  Yes Arrien, Jimmy Picket, MD  fluticasone  [facility]) 50 MCG/ACT nasal spray Place 2 sprays into both nostrils 2 (two) times daily. 12/13/17  Yes Collene Gobble, MD  glimepiride (AMARYL) 4 MG tablet Take 4 mg by mouth every morning. 04/20/16  Yes [provider]  Insulin Glargine (BASAGLAR KWIKPEN) 100 UNIT/ML SOPN Inject 12 Units into the skin daily.   Yes [provider]  loperamide (IMODIUM A-D) 2 MG tablet Take 4 mg by mouth 2 (two) times daily.   Yes [provider]  LORazepam (ATIVAN) 1 MG tablet Take 1 mg by mouth every morning.    Yes [provider]  metFORMIN (GLUCOPHAGE) 500 MG tablet Take 500 mg by mouth 2 (two) times daily. 10/03/16  Yes [provider]  metoprolol tartrate (LOPRESSOR) 25 MG tablet TAKE 1/2 TABLET BY MOUTH DAILY ( TAKE A WHOLE TABLET DAILY IF SBP>150) Patient taking differently: Take 25 mg by mouth daily.  11/23/17  Yes [name], MD  montelukast (SINGULAIR) 10 MG tablet TAKE 1 TABLET BY MOUTH EVERY DAY 01/11/18  Yes Byrum, Rose Fillers, MD  nitroGLYCERIN (NITROSTAT) 0.4 MG SL tablet Place 1 tablet (0.4 mg total) under the tongue every 5 (five) minutes as needed for chest pain. 09/11/13  Yes [name], MD  NOVOLOG FLEXPEN 100 UNIT/ML FlexPen Inject 4 Units into the skin 2 (two) times  daily as needed. 12/13/17  Yes [provider]  omeprazole (PRILOSEC) 40 MG capsule Take 40 mg by mouth every morning.    Yes [provider]  oxyCODONE (OXY IR/ROXICODONE) 5 MG immediate release tablet Take 1 tablet (5 mg total) by mouth every 8 (eight) hours as needed for severe pain. 01/24/18  Yes Bayard Hugger, NP  predniSONE (DELTASONE) 5 MG tablet Take 5 mg by mouth daily with breakfast.   Yes [provider]  promethazine (PHENERGAN) 25 MG tablet Take 25 mg by mouth at bedtime as needed for nausea or vomiting (or sleep).    Yes [provider]  rosuvastatin (CRESTOR) 20 MG tablet Take 1 tablet (20 mg total) by mouth daily. 10/25/17  Yes [name], MD  sertraline (ZOLOFT) 100 MG tablet Take 100 mg by mouth every morning.   Yes [provider]  famotidine (PEPCID) 20 MG tablet Take 20 mg by mouth daily. 05/19/17   [provider]    Physical Exam:  Constitutional: NAD, calm, comfortable Vitals:   01/28/18 1210 01/28/18 1211 01/28/18 1230 01/28/18 1400  BP: 128/65  135/67 (!) 150/73  Pulse: 66  64 70  Resp: 19  17 15   Temp: 98.6 F (37 C)     TempSrc: Oral     SpO2: 98%  98% 99%  Weight:  105.2 kg (232 lb)     Eyes: PERRL, lids and conjunctivae normal ENMT: Mucous membranes are moist. Posterior pharynx clear of any exudate or lesions.Normal dentition.  Neck: normal, supple, no masses, no thyromegaly Respiratory: clear to auscultation bilaterally, no wheezing, no crackles. Normal respiratory effort. No accessory muscle use.  Cardiovascular: Regular rate and rhythm, no murmurs / rubs / gallops. No extremity edema. 2+ pedal pulses. No carotid bruits.  Abdomen: no tenderness, no masses palpated. No hepatosplenomegaly. Bowel sounds positive.  Musculoskeletal: no clubbing / cyanosis. No joint deformity upper and lower extremities. Good ROM, no contractures. Normal muscle tone.  Right thumb 1 inch by half inch callus which is covered in a S eschar and tender to palpation.  I used a Q-tip to try to express some material but nothing came out. Skin: no rashes, lesions, ulcers. No induration Neurologic: CN 2-12 grossly intact. Sensation intact, DTR normal. Strength 5/5 in all 4.  Psychiatric: Normal judgment and insight. Alert and oriented x 3. Normal mood.     Labs on Admission: I have personally reviewed following labs and imaging studies  CBC: Recent Labs  Lab 01/28/18 1237  WBC 3.5*  NEUTROABS 2.0  HGB 10.1*  HCT 31.2*  MCV 96.6  PLT 272   Basic Metabolic Panel: Recent Labs  Lab 01/28/18 1237  NA 138  K 3.9  CL 107  CO2 20*  GLUCOSE 172*  BUN 12  CREATININE 0.93  CALCIUM 8.6*   Urine  analysis:    Component Value Date/Time   COLORURINE YELLOW 09/21/2017 [place] 09/21/2017 0540   LABSPEC 1.011 09/21/2017 0540   PHURINE 5.0 09/21/2017 0540   GLUCOSEU 50 (A) 09/21/2017 0540   HGBUR NEGATIVE 09/21/2017 0540   BILIRUBINUR NEGATIVE 09/21/2017 0540   KETONESUR NEGATIVE 09/21/2017 0540   PROTEINUR NEGATIVE 09/21/2017 0540   UROBILINOGEN 0.2 10/16/2013 0110   NITRITE NEGATIVE 09/21/2017 0540   LEUKOCYTESUR NEGATIVE 09/21/2017 0540    Radiological Exams on Admission: Dg Chest 2 View  Result Date: 01/28/2018 CLINICAL DATA:  Chest pain EXAM: CHEST - 2 VIEW COMPARISON:  Very 02/03/2018 FINDINGS: No  edema or consolidation. Heart is upper normal in size with pulmonary vascularity normal. Patient is status post coronary artery bypass grafting. No adenopathy. No bone lesions. IMPRESSION: Status post coronary artery bypass grafting. No edema or consolidation. Heart upper normal in size. Electronically Signed   By: Lowella Grip III M.D.   On: 01/28/2018 13:20   Dg Hand Complete Right  Result Date: 01/28/2018 CLINICAL DATA:  Thumb wound. EXAM: RIGHT HAND - COMPLETE 3+ VIEW COMPARISON:  None. FINDINGS: Round, partially calcified 2.2 cm lesion along the ulnar aspect of the first proximal phalanx and IP joint. This does not clearly involve the underlying bone. Moderate osteoarthritis of the first [facility] joint with mild subluxation. Remaining joint spaces are preserved. Diffuse osteopenia. IMPRESSION: 1. Round, partially calcified 2.2 cm lesion along the ulnar aspect of the first IP joint without definite involvement of the underlying bone. This is nonspecific, but could reflect chronic indolent granulomatous infection. Recommend non-emergent MRI with and without contrast to better characterize and evaluate for a discrete mass or underlying bony involvement. 2. 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 Electronically Signed   By: Titus Dubin M.D.   On: 01/28/2018 14:05     EKG: Independently reviewed.  Sinus rhythm with right ventricular hypertrophy  Assessment/Plan Principal Problem:   Cellulitis of right thumb Active Problems:   S/P CABG (coronary artery bypass graft) x 3   Stenosis of right carotid artery without infarction   Hypertension associated with diabetes (HCC)   PAF (paroxysmal atrial fibrillation) ([place])   Sleep apnea   Morbid obesity - s/p Lap Band 9/'05   Dyslipidemia, goal LDL below 70   S/P kidney transplant   Cellulitis of right thumb with mild sepsis: Patient with sepsis-like markers although may be more of an SIRS syndrome.  She will be hydrated we will recheck a lactic acid.  She will start on vancomycin and Zosyn and given that there was a little bit of yeast present we will also add some fluconazole.  I think that she should be treated fairly well with this Dr. Donnal Moat from orthopedic surgery will see the patient to see if the callus needs to be removed..  2.  Chest pain patient is complaining of cough and congestion no sign of infection on chest x-ray.  She is going to be on some antibiotics which should cover any pulmonary infection.  If chest pain persists would recommend rechecking chest x-ray this does not appear to be cardiac in origin.  3.  Hypertension associated with diabetes: Noted continue home medications.  4.  Paroxysmal atrial fibrillation: Continue her outpatient Plavix and diltiazem and metoprolol for rate control.  He is currently in sinus rhythm.  5.  Sleep apnea: Patient instructed to bring in her CPAP machine while here.  6.  Dyslipidemia associated with diabetes type 2: Continue Crestor.  7.  Status post kidney transplant continue azathioprine and prednisone.    DVT prophylaxis: SCDs Code Status: Full code Family Communication: Patient's daughter was present at the time of admission Disposition Plan: Home in 3 to 4 days Consults called: Dr. Lenon Curt from orthopedic surgery Admission status:  Inpatient   Lady Deutscher MD [facility] Pager [phone]  If 7PM-7AM, please contact night-coverage [email or web address] Password [facility]  01/28/2018, 4:48 PM

## 2018-01-28 NOTE — ED Notes (Signed)
Lab results reported to Nurse Ronalee Belts, 2.[address] of [place].

## 2018-01-28 NOTE — ED Notes (Signed)
REpaged [place] to [employer]

## 2018-01-28 NOTE — Progress Notes (Signed)
Pharmacy Antibiotic Note  Katherine [name] is a 62 y.o. female admitted on 01/28/2018 with thumb pain and possible sepsis due to cellulitis.  Pharmacy has been consulted for vancomycin and Zosyn dosing.  Plan: -Vancomycin 1250mg  IV q12h -Zosyn 3.375g IV x1 over 30 min then q8h EI -Monitor cultures, LOT  Weight: 232 lb (105.2 kg)  Temp (24hrs), Avg:98.6 F (37 C), Min:98.6 F (37 C), Max:98.6 F (37 C)  Recent Labs  Lab 01/28/18 1237 01/28/18 1255  WBC 3.5*  --   CREATININE 0.93  --   LATICACIDVEN  --  2.71*    Estimated Creatinine Clearance: 71.9 mL/min (by C-G formula based on SCr of 0.93 mg/dL).    Allergies  Allergen Reactions  . Tetracycline Hives  . Hydromorphone Hcl Nausea And Vomiting  . Niacin Other (See Comments)    Mouth blisters  . Niaspan [Niacin Er] Other (See Comments)    Mouth blisters  . Sulfa Antibiotics Nausea Only and Other (See Comments)    "Tears up stomach"  . Sulfonamide Derivatives Other (See Comments)    Reaction: per patient "tears her stomach up"  . Codeine Nausea And Vomiting  . Erythromycin Nausea And Vomiting  . Hydromorphone Hcl Nausea And Vomiting  . Morphine And Related Nausea And Vomiting  . Nalbuphine Nausea And Vomiting  . Sulfasalazine Nausea Only and Other (See Comments)    per patient "tears her stomach up", "Tears up stomach"  . Tape Rash and Other (See Comments)    No "plastic" tape," please    Antimicrobials this admission: Vancomycin 6/14 >>  Zosyn 6/14 >>   Dose adjustments this admission: none  Microbiology results: ordered  Thank you for allowing pharmacy to be a part of this patient's care.  Arrie Senate, PharmD, BCPS PGY-2 Cardiology Pharmacy Resident Phone: [phone] 01/28/2018

## 2018-01-28 NOTE — Telephone Encounter (Signed)
Attempted to reach patient regarding results. No answer at this time.

## 2018-01-28 NOTE — ED Triage Notes (Signed)
PT arrives from home for evaluation for chest pain and a wound infection on right thumb. PT was seen at [facility] 6/11 and given doxy for infection.  PT had kidney transplant in 1991 and is concerned about any infectious process. Pt reports she was diagnosed with UTI a week ago and still has symptoms. PT took a full course of cipro for that.   PT reports right sided chest pain for 2-3 days with exertional dizziness and weakness. History of triple bypass in 95.

## 2018-01-28 NOTE — ED Notes (Signed)
EDP notifed of loss of IV access. IV team consult placed.

## 2018-01-28 NOTE — ED Provider Notes (Addendum)
[facility] EMERGENCY DEPARTMENT Provider Note   CSN: [id number] Arrival date & time: 01/28/18  1205     History   Chief Complaint Chief Complaint  Patient presents with  . Chest Pain  . Wound Infection    HPI Katherine [name] is a 62 y.o. female.  HPI  62 year old female with a history of coronary disease, type 2 diabetes, and renal transplant presents with right thumb pain and worsening infection.  She states that started off as a small callus on her thumb on the right hand and has progressively worsened.  She went to urgent care couple days ago and was put on doxycycline after it was cleaned up and a culture was obtained.  The patient has noticed some redness on the proximal aspect of her hand since.  She is had subjective fever and chills but has not taken her temperature.  She is on daily prednisone.  Patient states the thumb wound bleeds often.  She also has had a cough for about the same month and last week had a day of having hemoptysis.  However that has resolved.  The cough is still present and now over the last couple days she has become weaker and weaker.  When walking from her bedroom to the bathroom she nearly passed out and felt like her legs were getting give out.  She has been short of breath.  This morning since waking up she is been having right sided chest burning that is constant.  Nothing makes it better or worse. Chest burning is similar to when she's had pneumonia in the past.  Past Medical History:  Diagnosis Date  . Anemia   . Anxiety   . Bilateral carotid artery stenosis    Carotid duplex 10/2949: 8-84% LICA, 16-60% RICA, >63% RECA, f/u 1 yr suggested  . CAD (coronary artery disease) of bypass graft 5/01; 3/'02, 8/'03, 10/'04; 1/15   PCI x 5 to SVG-D1   . CAD in native artery 07/1993    3 Vessel Disease (LAD-D1 & RCA) -- CABG  . CAD S/P percutaneous coronary angioplasty    PCI to SVG-D1 insertion/native D1 x 4 = '01 -(S660 BMS 2.5 x 9 -  insertion into D1; '02 - distal overlap ACS Pixel 2.5 x 8  BMS; '03 distal/native ISR/Thrombosis - Pixel 2.5 x 13; '04 - ISR-  Taxus 2.5 x 20 (covered all);; 1/15 - mid SVG-D1 (50% distal ISR) - Promus P 2.75 x 20 -- 2.8 mm  . COPD mixed type ([place])    Followed by Dr. Lamonte Sakai "pulmonologist said no COPD"  . Depression   . Depression with anxiety   . Diabetes mellitus type 2 in obese ([place])   . Diarrhea    started after cholecystectomy and mass removed from intestine  . Dyslipidemia, goal LDL below 70    08/2012: TC 137, TG 200, HDL 32!, LDL 45; on statin (followed by Dr.Deterding)  . ESRD (end stage renal disease) ([place]) 1991   s/p Cadaveric Renal Transplant [facility] - Dr. Jimmy Footman)   . Family history of adverse reaction to anesthesia    mom's bp dropped during/after anesthesia  . Fibromyalgia   . GERD (gastroesophageal reflux disease)   . Glomerulonephritis, chronic, rapidly progressive 4  . 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 07/1993   Rescue PTCA of RCA -- referred for CABG.  . H/O: GI bleed   . Headache    migraines in the past  . History of  kidney stones   . Hypertension associated with diabetes ([place])   . MRSA (methicillin resistant staph aureus) culture positive   . Obesity    s/p Lap Band 04/2004- Port Replacement 10/09 & 2/10 for infection; Dr. Excell Seltzer.  . OSA (obstructive sleep apnea)    no longer on CPAP or home O2, states she doesn't need now after lap band  . PAD (peripheral artery disease) ([place]) 08/2013   LEA Dopplers to be read by Dr. Fletcher Anon  . PAF (paroxysmal atrial fibrillation) ([place]) 06/2014   Noted on CardioNet Monitor  - rate ~112; no recurrent symptoms. Nonionic regulation because of frequent GI procedures. Patient prefers Plavix  . Pneumonia   . Recurrent boils    Bilateral Groin  . Rheumatoid arthritis ([place])    Per Patient Report; associated with OA  . 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 Servando Snare: LIMA-LAD, SVG-bifurcatingD1, SVG-rPDA  .  S/p cadaver renal transplant [place]  . Stroke [facility]) 2012   "right eye stroke- half blind now"  . Unstable angina ([place]) 5/01; 3/'02, 8/'03, 10/'04; 1/15   x 5 occurences since Inf-Post STEMI in 1994    Patient Active Problem List   Diagnosis Date Noted  . Status post arthroscopy of knee 12/03/2017  . Primary osteoarthritis of right knee 08/18/2017  . Aortic valve sclerosis 01/28/2017  . 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 Spondylosis of lumbar region without myelopathy or radiculopathy 03/30/2016  . Type 2 diabetes mellitus with peripheral neuropathy ([place]) 03/30/2016  . PAF (paroxysmal atrial fibrillation) ([place]) 06/28/2014  . Hypertension associated with diabetes ([place])   . PAD (peripheral artery disease) ([place]) 08/17/2013  . Stenosis of right carotid artery without infarction 10/08/2012  . Dyslipidemia, goal LDL below 70 10/08/2012    Class: Diagnosis of  . Mitral annular calcification 10/08/2012  . Renal transplant disorder 06/12/2012  . Chronic allergic rhinitis 04/29/2011  . Sleep apnea 09/09/2007  . Extrinsic asthma 09/09/2007  . GERD 09/09/2007  . Morbid obesity - s/p Lap Band 9/'05 05/07/2004    Class: Diagnosis of  . S/P CABG (coronary artery bypass graft) x 3 09/07/1993    Class: History of    Past Surgical History:  Procedure Laterality Date  . CARDIAC CATHETERIZATION  5/'01, 3/'02, 8/'03, 10/'04; 1/'15   08/22/2013: LAD & RCA 100%; LIMA-LAD & SVG-rPDA patent; Cx-- OM1 60%, OM2 ostial ~50%; SVG-D1 - 80% mid, 50% distal ISR --PCI  . CATHETER REMOVAL    . CHOLECYSTECTOMY N/A 10/29/2014   Procedure: LAPAROSCOPIC CHOLECYSTECTOMY WITH INTRAOPERATIVE CHOLANGIOGRAM;  Surgeon: Excell Seltzer, MD;  Location: [facility] ORS;  Service: General;  Laterality: N/A;  . CORONARY ANGIOPLASTY  1994   x5  . CORONARY ARTERY BYPASS GRAFT  1995   LIMA-LAD, SVG-RPDA, SVG-D1  . ESOPHAGOGASTRODUODENOSCOPY N/A 10/15/2016   Procedure: ESOPHAGOGASTRODUODENOSCOPY (EGD);  Surgeon: Wilford Corner, MD;   Location: [facility] ENDOSCOPY;  Service: Endoscopy;  Laterality: N/A;  . INCISE AND DRAIN ABCESS    . KIDNEY TRANSPLANT  1991  . KNEE ARTHROSCOPY WITH LATERAL MENISECTOMY Left 12/03/2017   Procedure: LEFT KNEE ARTHROSCOPY WITH LATERAL MENISECTOMY;  Surgeon: Earlie Server, MD;  Location: [facility];  Service: Orthopedics;  Laterality: Left;  . LAPAROSCOPIC GASTRIC BANDING  04/2004; 10/'09, 2/'10   Port Replacement x 2  . LEFT HEART CATHETERIZATION WITH CORONARY/GRAFT ANGIOGRAM N/A 08/23/2013   Procedure: LEFT HEART CATHETERIZATION WITH Beatrix Fetters;  Surgeon: Wellington Hampshire, MD;  Location: [facility] CATH LAB;  Service: Cardiovascular;  Laterality: N/A;  . Lower  Extremity Arterial Dopplers  08/2013   ABI: R 0.96, L 1.04  . MULTIPLE TOOTH EXTRACTIONS  age 80  . NM MYOVIEW LTD  03/2016   EF 62%. LOW RISK. C/W prior MI - no Ischemia. Apical hypokinesis.  [name] Kitchen PERCUTANEOUS CORONARY STENT INTERVENTION (PCI-S)  5/'01, 3/'02, 8/'03, 10/'04;   '01 - S660 BMS 2.5 x 9 - dSVG-D1 into D1; '02- post-stent stenosis - 2.5 x 8 Pixel BMS; '8\03: ISR/Thrombosis into native D1 - AngioJet, 2.5 x 13 Pixel; '04 - ISR 95% - covered stented area with Taxus DES 2.5 mm x 20 (2.88)  . PERCUTANEOUS CORONARY STENT INTERVENTION (PCI-S)  08/23/2013   Procedure: PERCUTANEOUS CORONARY STENT INTERVENTION (PCI-S);  Surgeon: Wellington Hampshire, MD;  Location: [facility] CATH LAB;  Service: Cardiovascular;;mid SVG-D1 80%; distal stent ~50% ISR; Promus Prermier DES 2.75 mm xc 20 mm (2.8 mm)  . PORT-A-CATH REMOVAL     kidney  . TRANSTHORACIC ECHOCARDIOGRAM  02/2016   EF 55-60%. Septal dyssynergy from CABG GR 2 DD. Aortic valve is trileaflet the functional bicuspid with sclerosis but not yet notable for stenosis stenosis.; no Significant change  . TUBAL LIGATION    . wrist fistula repair Left    dialysis for one year     OB History   None      Home Medications    Prior to Admission medications   Medication Sig Start Date End Date Taking?  Authorizing Provider  acetaminophen (TYLENOL) 500 MG tablet Take 1 tablet (500 mg total) by mouth every 6 (six) hours as needed for moderate pain or headache. Patient taking differently: Take 1,000 mg by mouth every 6 (six) hours as needed for moderate pain or headache.  10/17/16   Arrien, Jimmy Picket, MD  albuterol (VENTOLIN HFA) 108 (90 Base) MCG/ACT inhaler INHALE 2 PUFFS INTO THE LUNGS EVERY 6 (SIX) HOURS AS NEEDED FOR WHEEZING OR SHORTNESS OF BREATH. 06/14/17   Collene Gobble, MD  azaTHIOprine (IMURAN) 50 MG tablet Take 125 mg by mouth every evening.     [provider]  budesonide-formoterol (SYMBICORT) 160-4.5 MCG/ACT inhaler Inhale 2 puffs into the lungs 2 (two) times daily. 10/07/17   Collene Gobble, MD  calcitRIOL (ROCALTROL) 0.25 MCG capsule Take 0.25 mcg by mouth every 3 (three) days.     [provider]  carbamazepine (TEGRETOL) 200 MG tablet TAKE 200 MG BY MOUTH TWICE A DAY 01/11/18   Meredith Staggers, MD  clopidogrel (PLAVIX) 75 MG tablet Take 1 tablet (75 mg total) by mouth daily. 01/26/17   [name], MD  clotrimazole (MYCELEX) 10 MG troche Take 1 tablet (10 mg total) by mouth 5 (five) times daily. Patient taking differently: Take 10 mg by mouth daily as needed (thrush).  10/21/16   Domenic Polite, MD  cyclobenzaprine (FLEXERIL) 10 MG tablet TAKE 1 TABLET BY MOUTH 3 TIMES DAILY AS NEEDED FOR MUSCLE SPASMS Patient taking differently: Take 10 mg by mouth twice daily 10/19/17   Meredith Staggers, MD  diltiazem (CARDIZEM CD) 120 MG 24 hr capsule TAKE 1 CAPSULE (120 MG TOTAL) BY MOUTH DAILY. NEED OV. 02/22/17   [name], MD  doxycycline (VIBRAMYCIN) 100 MG capsule Take 1 capsule (100 mg total) by mouth 2 (two) times daily. 01/25/18   Raylene Everts, MD  DULoxetine (CYMBALTA) 30 MG capsule TAKE 2 CAPSULES (60 MG TOTAL) BY MOUTH EVERY MORNING. 01/26/18   Meredith Staggers, MD  erythromycin base (E-MYCIN) 500 MG tablet Take 500 mg  by mouth daily.    [provider]  famotidine (PEPCID) 20 MG tablet Take 20 mg by mouth daily. 05/19/17   [provider]  ferrous sulfate 325 (65 FE) MG tablet Take 1 tablet (325 mg total) by mouth 3 (three) times daily with meals. Patient taking differently: Take 325 mg by mouth 2 (two) times daily.  10/17/16   Arrien, Jimmy Picket, MD  fluticasone (FLONASE) 50 MCG/ACT nasal spray Place 2 sprays into both nostrils 2 (two) times daily. 12/13/17   Collene Gobble, MD  glimepiride (AMARYL) 4 MG tablet Take 4 mg by mouth every morning. 04/20/16   [provider]  insulin glargine (LANTUS) 100 UNIT/ML injection Inject 12 Units into the skin 2 (two) times daily as needed (for BS of > 180).     [provider]  loperamide (IMODIUM A-D) 2 MG tablet Take 4 mg by mouth 2 (two) times daily.    [provider]  LORazepam (ATIVAN) 1 MG tablet Take 1 mg by mouth every morning.     [provider]  metFORMIN (GLUCOPHAGE) 500 MG tablet Take 500 mg by mouth 2 (two) times daily. 10/03/16   [provider]  metoprolol tartrate (LOPRESSOR) 25 MG tablet TAKE 1/2 TABLET BY MOUTH DAILY ( TAKE A WHOLE TABLET DAILY IF SBP>150) Patient taking differently: Take 25 mg by mouth daily.  11/23/17   [name], MD  montelukast (SINGULAIR) 10 MG tablet TAKE 1 TABLET BY MOUTH EVERY DAY 01/11/18   Collene Gobble, MD  nitroGLYCERIN (NITROSTAT) 0.4 MG SL tablet Place 1 tablet (0.4 mg total) under the tongue every 5 (five) minutes as needed for chest pain. 09/11/13   [name], MD  omeprazole (PRILOSEC) 40 MG capsule Take 40 mg by mouth every morning.     [provider]  oxyCODONE (OXY IR/ROXICODONE) 5 MG immediate release tablet Take 1 tablet (5 mg total) by mouth every 8 (eight) hours as needed for severe pain. 01/24/18   Bayard Hugger, NP  predniSONE (DELTASONE) 5 MG tablet Take 5 mg by mouth daily with breakfast.    [provider]  promethazine (PHENERGAN) 25 MG tablet  Take 25 mg by mouth at bedtime as needed for nausea or vomiting (or sleep).     [provider]  rosuvastatin (CRESTOR) 20 MG tablet Take 1 tablet (20 mg total) by mouth daily. 10/25/17   [name], MD  sertraline (ZOLOFT) 100 MG tablet Take 100 mg by mouth every morning.    [provider]  SYMBICORT 160-4.5 MCG/ACT inhaler TAKE 1 PUFF BY MOUTH TWICE A DAY 01/11/18   Collene Gobble, MD    Family History Family History  Problem Relation Age of Onset  . Cancer Mother        liver  . Heart disease Father   . Cancer Father        colon  . Arrhythmia Brother        Atrial Fibrillation  . Arrhythmia Paternal Aunt        Atrial Fibrillation    Social History Social History   Tobacco Use  . Smoking status: Former Smoker    Packs/day: 1.00    Years: 30.00    Pack years: 30.00    Types: Cigarettes    Last attempt to quit: 08/17/2002    Years since quitting: 15.4  . Smokeless tobacco: Never Used  Substance Use Topics  . Alcohol use: No  .  Drug use: No     Allergies   Tetracycline; Hydromorphone hcl; Niacin; Niaspan [niacin er]; Sulfa antibiotics; Sulfonamide derivatives; Codeine; Erythromycin; Hydromorphone hcl; Morphine and related; Nalbuphine; Sulfasalazine; and Tape   Review of Systems Review of Systems  Constitutional: Positive for fever (subjective).  Respiratory: Positive for cough and shortness of breath.   Cardiovascular: Positive for chest pain.  Gastrointestinal: Positive for nausea. Negative for abdominal pain and vomiting.  Musculoskeletal: Positive for arthralgias.  Skin: Positive for wound.  Neurological: Positive for weakness.  All other systems reviewed and are negative.    Physical Exam Updated Vital Signs BP 128/65   Pulse 66   Temp 98.6 F (37 C) (Oral)   Resp 19   Wt 105.2 kg (232 lb)   SpO2 98%   BMI 43.84 kg/m   Physical Exam  Constitutional: She is oriented to person, place, and time. She appears well-developed and  well-nourished.  Non-toxic appearance. She does not appear ill. No distress.  obese  HENT:  Head: Normocephalic and atraumatic.  Right Ear: External ear normal.  Left Ear: External ear normal.  Nose: Nose normal.  Eyes: Right eye exhibits no discharge. Left eye exhibits no discharge.  Cardiovascular: Normal rate, regular rhythm and normal heart sounds.  Pulmonary/Chest: Effort normal and breath sounds normal. She has no wheezes. She has no rales. She exhibits no tenderness.  Abdominal: Soft. There is no tenderness.  Musculoskeletal:  Right thumb is tender, no swelling. Mild erythema tracking proximally. See picture below  Neurological: She is alert and oriented to person, place, and time.  No facial droop. Clear speech. 5/5 strength in all 4 extremities  Skin: Skin is warm and dry.  Nursing note and vitals reviewed.      ED Treatments / Results  Labs (all labs ordered are listed, but only abnormal results are displayed) Labs Reviewed  BASIC METABOLIC PANEL - Abnormal; Notable for the following components:      Result Value   CO2 20 (*)    Glucose, Bld 172 (*)    Calcium 8.6 (*)    All other components within normal limits  CBC WITH DIFFERENTIAL/PLATELET - Abnormal; Notable for the following components:   WBC 3.5 (*)    RBC 3.23 (*)    Hemoglobin 10.1 (*)    HCT 31.2 (*)    All other components within normal limits  I-STAT CG4 LACTIC ACID, ED - Abnormal; Notable for the following components:   Lactic Acid, Venous 2.71 (*)    All other components within normal limits  CULTURE, BLOOD (ROUTINE X 2)  CULTURE, BLOOD (ROUTINE X 2)  URINALYSIS, ROUTINE W REFLEX MICROSCOPIC  I-STAT TROPONIN, ED  I-STAT CG4 LACTIC ACID, ED    EKG EKG Interpretation  Date/Time:  Friday January 28 2018 12:04:06 EDT Ventricular Rate:  68 PR Interval:    QRS Duration: 88 QT Interval:  391 QTC Calculation: 416 R Axis:   -3 Text Interpretation:  Sinus rhythm RSR' in V1 or V2, right VCD or RVH  Nonspecific T abnrm, anterolateral leads LBBB no longer present compared to Feb 2019 Confirmed by Sherwood Gambler [phone]) on 01/28/2018 12:17:21 PM   Radiology Dg Chest 2 View  Result Date: 01/28/2018 CLINICAL DATA:  Chest pain EXAM: CHEST - 2 VIEW COMPARISON:  Very 02/03/2018 FINDINGS: No edema or consolidation. Heart is upper normal in size with pulmonary vascularity normal. Patient is status post coronary artery bypass grafting. No adenopathy. No bone lesions. IMPRESSION: Status post coronary artery bypass grafting.  No edema or consolidation. Heart upper normal in size. Electronically Signed   By: Lowella Grip III M.D.   On: 01/28/2018 13:20   Dg Hand Complete Right  Result Date: 01/28/2018 CLINICAL DATA:  Thumb wound. EXAM: RIGHT HAND - COMPLETE 3+ VIEW COMPARISON:  None. FINDINGS: Round, partially calcified 2.2 cm lesion along the ulnar aspect of the first proximal phalanx and IP joint. This does not clearly involve the underlying bone. Moderate osteoarthritis of the first [facility] joint with mild subluxation. Remaining joint spaces are preserved. Diffuse osteopenia. IMPRESSION: 1. Round, partially calcified 2.2 cm lesion along the ulnar aspect of the first IP joint without definite involvement of the underlying bone. This is nonspecific, but could reflect chronic indolent granulomatous infection. Recommend non-emergent MRI with and without contrast to better characterize and evaluate for a discrete mass or underlying bony involvement. 2. 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 Electronically Signed   By: Titus Dubin M.D.   On: 01/28/2018 14:05    Procedures .Critical Care Performed by: Sherwood Gambler, MD Authorized by: Sherwood Gambler, MD   Critical care provider statement:    Critical care time (minutes):  30   Critical care time was exclusive of:  Separately billable procedures and treating other patients   Critical care was necessary to treat or prevent imminent or life-threatening  deterioration of the following conditions:  Sepsis   Critical care was time spent personally by me on the following activities:  Development of treatment plan with patient or surrogate, discussions with consultants, evaluation of patient's response to treatment, examination of patient, obtaining history from patient or surrogate, ordering and performing treatments and interventions, ordering and review of laboratory studies, ordering and review of radiographic studies, pulse oximetry and re-evaluation of patient's condition   (including critical care time)  Angiocath insertion Performed by: Ephraim Hamburger  Consent: Verbal consent obtained. Risks and benefits: risks, benefits and alternatives were discussed Time out: Immediately prior to procedure a "time out" was called to verify the correct patient, procedure, equipment, support staff and site/side marked as required.  Preparation: Patient was prepped and draped in the usual sterile fashion.  Vein Location: right basilic  Ultrasound Guided  Gauge: 20  Normal blood return and flush without difficulty Patient tolerance: Patient tolerated the procedure well with no immediate complications.     Medications Ordered in ED Medications  sodium chloride 0.9 % bolus 1,000 mL (has no administration in time range)  vancomycin (VANCOCIN) 1,250 mg in sodium chloride 0.9 % 250 mL IVPB (1,250 mg Intravenous New Bag/Given 01/28/18 1505)  piperacillin-tazobactam (ZOSYN) IVPB 3.375 g (3.375 g Intravenous New Bag/Given 01/28/18 2020)  fluconazole (DIFLUCAN) IVPB 200 mg (0 mg Intravenous Stopped 01/28/18 1840)  acetaminophen (TYLENOL) tablet 500 mg (has no administration in time range)  albuterol (PROVENTIL) (2.5 MG/3ML) 0.083% nebulizer solution 3 mL (has no administration in time range)  mometasone-formoterol (DULERA) 200-5 MCG/ACT inhaler 2 puff (2 puffs Inhalation Not Given 01/28/18 2032)  carbamazepine (TEGRETOL) tablet 200 mg (200 mg Oral Given  01/28/18 2135)  clopidogrel (PLAVIX) tablet 75 mg (has no administration in time range)  cyclobenzaprine (FLEXERIL) tablet 5 mg (has no administration in time range)  diltiazem (CARDIZEM CD) 24 hr capsule 120 mg (has no administration in time range)  DULoxetine (CYMBALTA) DR capsule 60 mg (has no administration in time range)  ferrous sulfate tablet 325 mg (325 mg Oral Given 01/28/18 2134)  fluticasone (FLONASE) 50 MCG/ACT nasal spray 2 spray (2 sprays Each Nare Given 01/28/18 2138)  metoprolol tartrate (LOPRESSOR) tablet 25 mg (has no administration in time range)  montelukast (SINGULAIR) tablet 10 mg (10 mg Oral Not Given 01/28/18 2134)  nitroGLYCERIN (NITROSTAT) SL tablet 0.4 mg (has no administration in time range)  oxyCODONE (Oxy IR/ROXICODONE) immediate release tablet 5 mg (5 mg Oral Given 01/28/18 2133)  rosuvastatin (CRESTOR) tablet 20 mg (has no administration in time range)  azaTHIOprine (IMURAN) tablet 125 mg (125 mg Oral Given 01/28/18 2136)  calcitRIOL (ROCALTROL) capsule 0.25 mcg (0.25 mcg Oral Given 01/28/18 1901)  erythromycin (E-MYCIN) tablet 500 mg (500 mg Oral Not Given 01/28/18 2128)  famotidine (PEPCID) tablet 20 mg (20 mg Oral Given 01/28/18 2137)  glimepiride (AMARYL) tablet 4 mg (has no administration in time range)  LORazepam (ATIVAN) tablet 1 mg (has no administration in time range)  metFORMIN (GLUCOPHAGE) tablet 500 mg (has no administration in time range)  pantoprazole (PROTONIX) EC tablet 40 mg (has no administration in time range)  predniSONE (DELTASONE) tablet 5 mg (has no administration in time range)  sertraline (ZOLOFT) tablet 100 mg (has no administration in time range)  0.9 % NaCl with KCl 20 mEq/ L  infusion ( Intravenous New Bag/Given 01/28/18 1901)  ondansetron (ZOFRAN) tablet 4 mg ( Oral See Alternative 01/28/18 2017)    Or  ondansetron (ZOFRAN) injection 4 mg (4 mg Intravenous Given 01/28/18 2017)  insulin glargine (LANTUS) injection 12 Units (has no  administration in time range)  piperacillin-tazobactam (ZOSYN) IVPB 3.375 g (0 g Intravenous Stopped 01/28/18 1535)     Initial Impression / Assessment and Plan / ED Course  I have reviewed the triage vital signs and the nursing notes.  Pertinent labs & imaging results that were available during my care of the patient were reviewed by me and considered in my medical decision making (see chart for details).     Patient presents with worsening thumb wound and likely infection.  Given the streaking seen going to her wrist as well as her immunocompromise, I will start her on broad antibiotics.  There was some yeast that grew on the culture obtained from urgent care and while this may be contaminant, given she is immunosuppressed we will also start antifungals.  Discussed with hand surgery, Dr. Lenon Curt who will evaluate.  Hospitalist to admit.  Final Clinical Impressions(s) / ED Diagnoses   Final diagnoses:  Open wound of right thumb, initial encounter    ED Discharge Orders    None       Sherwood Gambler, MD 01/28/18 3710    Sherwood Gambler, MD 02/08/18 2103

## 2018-01-28 NOTE — ED Notes (Signed)
Patient transported to X-ray 

## 2018-01-29 ENCOUNTER — Encounter (HOSPITAL_COMMUNITY)
Admission: EM | Disposition: A | Discharge status: Home / Self Care | Payer: Self-pay | Source: Home / Self Care | Attending: Internal Medicine

## 2018-01-29 ENCOUNTER — Inpatient Hospital Stay (HOSPITAL_COMMUNITY): Payer: Medicare Other | Admitting: Anesthesiology

## 2018-01-29 ENCOUNTER — Encounter (HOSPITAL_COMMUNITY): Payer: Self-pay | Admitting: Anesthesiology

## 2018-01-29 DIAGNOSIS — L03011 Cellulitis of right finger: Principal | ICD-10-CM

## 2018-01-29 DIAGNOSIS — E669 Obesity, unspecified: Secondary | ICD-10-CM

## 2018-01-29 DIAGNOSIS — I10 Essential (primary) hypertension: Secondary | ICD-10-CM

## 2018-01-29 DIAGNOSIS — E1169 Type 2 diabetes mellitus with other specified complication: Secondary | ICD-10-CM

## 2018-01-29 DIAGNOSIS — E1159 Type 2 diabetes mellitus with other circulatory complications: Secondary | ICD-10-CM

## 2018-01-29 DIAGNOSIS — S61001A Unspecified open wound of right thumb without damage to nail, initial encounter: Secondary | ICD-10-CM

## 2018-01-29 DIAGNOSIS — Z940 Kidney transplant status: Secondary | ICD-10-CM

## 2018-01-29 HISTORY — PX: I & D EXTREMITY: SHX5045

## 2018-01-29 LAB — BASIC METABOLIC PANEL
ANION GAP: 10 (ref 5–15)
BUN: 12 mg/dL (ref 6–20)
CO2: 22 mmol/L (ref 22–32)
Calcium: 8.5 mg/dL — ABNORMAL LOW (ref 8.9–10.3)
Chloride: 107 mmol/L (ref 101–111)
Creatinine, Ser: 0.87 mg/dL (ref 0.44–1.00)
GFR calc Af Amer: >60 mL/min (ref >60)
GFR calc non Af Amer: >60 mL/min (ref >60)
GLUCOSE: 84 mg/dL (ref 65–99)
POTASSIUM: 4.2 mmol/L (ref 3.5–5.1)
Sodium: 139 mmol/L (ref 135–145)

## 2018-01-29 LAB — CBC
HEMATOCRIT: 28.3 % — AB (ref 36.0–46.0)
HEMOGLOBIN: 9.2 g/dL — AB (ref 12.0–15.0)
MCH: 31.2 pg (ref 26.0–34.0)
MCHC: 32.5 g/dL (ref 30.0–36.0)
MCV: 95.9 fL (ref 78.0–100.0)
Platelets: 184 10*3/uL (ref 150–400)
RBC: 2.95 MIL/uL — ABNORMAL LOW (ref 3.87–5.11)
RDW: 14.4 % (ref 11.5–15.5)
WBC: 2.8 10*3/uL — ABNORMAL LOW (ref 4.0–10.5)

## 2018-01-29 LAB — GLUCOSE, CAPILLARY
GLUCOSE-CAPILLARY: 188 mg/dL — AB (ref 65–99)
GLUCOSE-CAPILLARY: 152 mg/dL — AB (ref 65–99)
GLUCOSE-CAPILLARY: 291 mg/dL — AB (ref 65–99)
Glucose-Capillary: 80 mg/dL (ref 65–99)
Glucose-Capillary: 108 mg/dL — ABNORMAL HIGH (ref 65–99)

## 2018-01-29 LAB — SURGICAL PCR SCREEN
MRSA, PCR: NEGATIVE
Staphylococcus aureus: POSITIVE — AB

## 2018-01-29 LAB — HIV ANTIBODY (ROUTINE TESTING W REFLEX): HIV Screen 4th Generation wRfx: NONREACTIVE

## 2018-01-29 SURGERY — IRRIGATION AND DEBRIDEMENT EXTREMITY
Anesthesia: General | Site: Thumb | Laterality: Right

## 2018-01-29 MED ORDER — DEXAMETHASONE SODIUM PHOSPHATE 10 MG/ML IJ SOLN
INTRAMUSCULAR | Status: DC | PRN
  Administered 2018-01-29: 5 mg via INTRAVENOUS

## 2018-01-29 MED ORDER — 0.9 % SODIUM CHLORIDE (POUR BTL) OPTIME
TOPICAL | Status: DC | PRN
  Administered 2018-01-29: 1000 mL

## 2018-01-29 MED ORDER — EPHEDRINE SULFATE 50 MG/ML IJ SOLN
INTRAMUSCULAR | Status: DC | PRN
  Administered 2018-01-29 (×3): 10 mg via INTRAVENOUS

## 2018-01-29 MED ORDER — BUPIVACAINE HCL (PF) 0.25 % IJ SOLN
INTRAMUSCULAR | Status: AC
  Filled 2018-01-29: qty 30

## 2018-01-29 MED ORDER — ONDANSETRON HCL 4 MG/2ML IJ SOLN
INTRAMUSCULAR | Status: AC
  Filled 2018-01-29: qty 2

## 2018-01-29 MED ORDER — MIDAZOLAM HCL 2 MG/2ML IJ SOLN
INTRAMUSCULAR | Status: AC
  Filled 2018-01-29: qty 2

## 2018-01-29 MED ORDER — MUPIROCIN 2 % EX OINT
TOPICAL_OINTMENT | CUTANEOUS | Status: AC
  Administered 2018-01-29: 13:00:00
  Filled 2018-01-29: qty 22

## 2018-01-29 MED ORDER — BUPIVACAINE HCL (PF) 0.25 % IJ SOLN
INTRAMUSCULAR | Status: DC | PRN
  Administered 2018-01-29: 10 mL

## 2018-01-29 MED ORDER — FENTANYL CITRATE (PF) 100 MCG/2ML IJ SOLN: 25.0000 ug | INTRAMUSCULAR | Status: DC | PRN

## 2018-01-29 MED ORDER — LIDOCAINE HCL (CARDIAC) PF 100 MG/5ML IV SOSY
PREFILLED_SYRINGE | INTRAVENOUS | Status: DC | PRN
  Administered 2018-01-29: 80 mg via INTRAVENOUS

## 2018-01-29 MED ORDER — MIDAZOLAM HCL 5 MG/5ML IJ SOLN
INTRAMUSCULAR | Status: DC | PRN
  Administered 2018-01-29: 1 mg via INTRAVENOUS

## 2018-01-29 MED ORDER — PROMETHAZINE HCL 25 MG/ML IJ SOLN
12.5000 mg | Freq: Once | INTRAMUSCULAR | Status: AC
Start: 2018-01-29 — End: 2018-01-29
  Administered 2018-01-29: 12.5 mg via INTRAVENOUS
  Filled 2018-01-29: qty 1

## 2018-01-29 MED ORDER — ARTIFICIAL TEARS OPHTHALMIC OINT
TOPICAL_OINTMENT | OPHTHALMIC | Status: AC
  Filled 2018-01-29: qty 3.5

## 2018-01-29 MED ORDER — PROPOFOL 10 MG/ML IV BOLUS
INTRAVENOUS | Status: AC
  Filled 2018-01-29: qty 20

## 2018-01-29 MED ORDER — ALBUTEROL SULFATE HFA 108 (90 BASE) MCG/ACT IN AERS
INHALATION_SPRAY | RESPIRATORY_TRACT | Status: DC | PRN
  Administered 2018-01-29: 2 via RESPIRATORY_TRACT

## 2018-01-29 MED ORDER — FENTANYL CITRATE (PF) 100 MCG/2ML IJ SOLN
INTRAMUSCULAR | Status: DC | PRN
  Administered 2018-01-29: 100 ug via INTRAVENOUS

## 2018-01-29 MED ORDER — PROMETHAZINE HCL 25 MG PO TABS
12.5000 mg | ORAL_TABLET | Freq: Three times a day (TID) | ORAL | Status: DC | PRN
  Administered 2018-01-29 – 2018-02-02 (×8): 12.5 mg via ORAL
  Filled 2018-01-29 (×8): qty 1

## 2018-01-29 MED ORDER — INSULIN ASPART 100 UNIT/ML ~~LOC~~ SOLN
5.0000 [IU] | Freq: Once | SUBCUTANEOUS | Status: AC
  Administered 2018-01-29: 5 [IU] via SUBCUTANEOUS

## 2018-01-29 MED ORDER — PHENYLEPHRINE HCL 10 MG/ML IJ SOLN
INTRAMUSCULAR | Status: DC | PRN
  Administered 2018-01-29 (×2): 80 ug via INTRAVENOUS

## 2018-01-29 MED ORDER — DOUBLE ANTIBIOTIC 500-10000 UNIT/GM EX OINT
TOPICAL_OINTMENT | CUTANEOUS | Status: AC
  Filled 2018-01-29: qty 1

## 2018-01-29 MED ORDER — DEXAMETHASONE SODIUM PHOSPHATE 10 MG/ML IJ SOLN
INTRAMUSCULAR | Status: AC
  Filled 2018-01-29: qty 1

## 2018-01-29 MED ORDER — PHENYLEPHRINE 40 MCG/ML (10ML) SYRINGE FOR IV PUSH (FOR BLOOD PRESSURE SUPPORT)
PREFILLED_SYRINGE | INTRAVENOUS | Status: AC
  Filled 2018-01-29: qty 10

## 2018-01-29 MED ORDER — PHENYLEPHRINE HCL 10 MG/ML IJ SOLN
INTRAVENOUS | Status: DC | PRN
  Administered 2018-01-29: 50 ug/min via INTRAVENOUS

## 2018-01-29 MED ORDER — LACTATED RINGERS IV SOLN
INTRAVENOUS | Status: DC | PRN
  Administered 2018-01-29: 17:00:00 via INTRAVENOUS

## 2018-01-29 MED ORDER — ONDANSETRON HCL 4 MG/2ML IJ SOLN: 4.0000 mg | Freq: Once | INTRAMUSCULAR | Status: DC | PRN

## 2018-01-29 MED ORDER — EPHEDRINE SULFATE 50 MG/ML IJ SOLN
INTRAMUSCULAR | Status: AC
  Filled 2018-01-29: qty 1

## 2018-01-29 MED ORDER — AZATHIOPRINE 50 MG PO TABS
125.0000 mg | ORAL_TABLET | Freq: Every evening | ORAL | Status: DC
  Administered 2018-01-29 – 2018-02-01 (×4): 125 mg via ORAL
  Filled 2018-01-29 (×5): qty 3

## 2018-01-29 MED ORDER — PROMETHAZINE HCL 25 MG/ML IJ SOLN
6.2500 mg | INTRAMUSCULAR | Status: DC | PRN
  Administered 2018-01-29: 6.25 mg via INTRAVENOUS

## 2018-01-29 MED ORDER — SUCCINYLCHOLINE CHLORIDE 200 MG/10ML IV SOSY
PREFILLED_SYRINGE | INTRAVENOUS | Status: AC
  Filled 2018-01-29: qty 10

## 2018-01-29 MED ORDER — FENTANYL CITRATE (PF) 250 MCG/5ML IJ SOLN
INTRAMUSCULAR | Status: AC
  Filled 2018-01-29: qty 5

## 2018-01-29 MED ORDER — PROMETHAZINE HCL 25 MG/ML IJ SOLN
INTRAMUSCULAR | Status: AC
  Filled 2018-01-29: qty 1

## 2018-01-29 MED ORDER — LIDOCAINE 2% (20 MG/ML) 5 ML SYRINGE
INTRAMUSCULAR | Status: AC
  Filled 2018-01-29: qty 5

## 2018-01-29 MED ORDER — SUCCINYLCHOLINE CHLORIDE 20 MG/ML IJ SOLN
INTRAMUSCULAR | Status: DC | PRN
  Administered 2018-01-29: 140 mg via INTRAVENOUS

## 2018-01-29 MED ORDER — PROPOFOL 10 MG/ML IV BOLUS
INTRAVENOUS | Status: DC | PRN
  Administered 2018-01-29: 200 mg via INTRAVENOUS

## 2018-01-29 SURGICAL SUPPLY — 47 items
BAG DECANTER FOR FLEXI CONT (MISCELLANEOUS) ×3 IMPLANT
BANDAGE ACE 3X5.8 VEL STRL LF (GAUZE/BANDAGES/DRESSINGS) IMPLANT
BANDAGE ACE 4X5 VEL STRL LF (GAUZE/BANDAGES/DRESSINGS) IMPLANT
BNDG COHESIVE 1X5 TAN STRL LF (GAUZE/BANDAGES/DRESSINGS) ×2 IMPLANT
BNDG CONFORM 2 STRL LF (GAUZE/BANDAGES/DRESSINGS) ×2 IMPLANT
BNDG GAUZE ELAST 4 BULKY (GAUZE/BANDAGES/DRESSINGS) IMPLANT
CORDS BIPOLAR (ELECTRODE) ×2 IMPLANT
CUFF TOURNIQUET SINGLE 18IN (TOURNIQUET CUFF) ×2 IMPLANT
DRAPE SURG 17X23 STRL (DRAPES) ×3 IMPLANT
DRSG MEPITEL 4X7.2 (GAUZE/BANDAGES/DRESSINGS) ×2 IMPLANT
ELECT REM PT RETURN 9FT ADLT (ELECTROSURGICAL)
ELECTRODE REM PT RTRN 9FT ADLT (ELECTROSURGICAL) IMPLANT
FLUID NSS /IRRIG 3000 ML XXX (IV SOLUTION) IMPLANT
GAUZE PACKING IODOFORM 1/4X5 (PACKING) IMPLANT
GAUZE SPONGE 4X4 12PLY STRL (GAUZE/BANDAGES/DRESSINGS) ×1 IMPLANT
GAUZE SPONGE 4X4 12PLY STRL LF (GAUZE/BANDAGES/DRESSINGS) ×2 IMPLANT
GAUZE XEROFORM 1X8 LF (GAUZE/BANDAGES/DRESSINGS) ×1 IMPLANT
GLOVE BIOGEL M 8.0 STRL (GLOVE) ×3 IMPLANT
GOWN STRL REUS W/ TWL LRG LVL3 (GOWN DISPOSABLE) ×2 IMPLANT
GOWN STRL REUS W/TWL LRG LVL3 (GOWN DISPOSABLE) ×6
HANDPIECE INTERPULSE COAX TIP (DISPOSABLE)
KIT BASIN OR (CUSTOM PROCEDURE TRAY) ×3 IMPLANT
KIT TURNOVER KIT B (KITS) ×3 IMPLANT
MANIFOLD NEPTUNE II (INSTRUMENTS) ×1 IMPLANT
NDL HYPO 25GX1X1/2 BEV (NEEDLE) IMPLANT
NEEDLE HYPO 25GX1X1/2 BEV (NEEDLE) ×3 IMPLANT
NS IRRIG 1000ML POUR BTL (IV SOLUTION) ×3 IMPLANT
PACK ORTHO EXTREMITY (CUSTOM PROCEDURE TRAY) ×3 IMPLANT
PAD ARMBOARD 7.5X6 YLW CONV (MISCELLANEOUS) ×6 IMPLANT
PAD CAST 4YDX4 CTTN HI CHSV (CAST SUPPLIES) IMPLANT
PADDING CAST COTTON 4X4 STRL (CAST SUPPLIES)
SET CYSTO W/LG BORE CLAMP LF (SET/KITS/TRAYS/PACK) IMPLANT
SET HNDPC FAN SPRY TIP SCT (DISPOSABLE) IMPLANT
SOAP 2 % CHG 4 OZ (WOUND CARE) ×3 IMPLANT
SPONGE LAP 18X18 X RAY DECT (DISPOSABLE) IMPLANT
SPONGE LAP 4X18 RFD (DISPOSABLE) ×1 IMPLANT
SUCTION FRAZIER TIP 8 FR DISP (SUCTIONS) ×2
SUCTION TUBE FRAZIER 8FR DISP (SUCTIONS) IMPLANT
SWAB COLLECTION DEVICE MRSA (MISCELLANEOUS) ×2 IMPLANT
SWAB CULTURE ESWAB REG 1ML (MISCELLANEOUS) ×2 IMPLANT
SYR CONTROL 10ML LL (SYRINGE) ×2 IMPLANT
TOWEL OR 17X24 6PK STRL BLUE (TOWEL DISPOSABLE) ×1 IMPLANT
TOWEL OR 17X26 10 PK STRL BLUE (TOWEL DISPOSABLE) ×3 IMPLANT
TUBE CONNECTING 12'X1/4 (SUCTIONS) ×1
TUBE CONNECTING 12X1/4 (SUCTIONS) ×2 IMPLANT
WATER STERILE IRR 1000ML POUR (IV SOLUTION) ×3 IMPLANT
YANKAUER SUCT BULB TIP NO VENT (SUCTIONS) ×1 IMPLANT

## 2018-01-29 NOTE — Progress Notes (Signed)
[facility] PROGRESS NOTE  Rosell Khouri ERD:[id number] DOB: 10-25-1955 DOA: 01/28/2018  PCP: [name], MD  Brief History/Interval Summary: 62 year old Caucasian female with a past medical history of coronary artery disease status post CABG, diabetes mellitus type 2, morbid obesity status post lap band surgery, history of kidney transplant presented with right thumb pain.  She has had a scab-like lesion there since a month.  This has been progressively getting worse.  About a week ago she noticed that the skin was red.  She went to urgent care.  They drew some cultures from that area and applied some kind of lotion.  She was prescribed doxycycline.  However her symptoms have not improved.  She experience worsening pain.  There is also some redness and streaking on her wrist area.  So she decided to come back to the hospital.  There was also some complains about shortness of breath and chest discomfort.  Reason for Visit: Right thumb lesion, cellulitis  Consultants: Dr. Lenon Curt with hand surgery  Procedures: None yet  Antibiotics: On vancomycin Zosyn and Diflucan.  No clear indication to continue Zosyn.  We will leave her on vancomycin and Diflucan for now.  Subjective/Interval History: Patient continues to complain of pain in her thumb.  Denies any chest pain or shortness of breath today.  ROS: Denies any nausea or vomiting  Objective:  Vital Signs  Vitals:   01/28/18 1955 01/29/18 0436 01/29/18 0634 01/29/18 0907  BP: (!) 161/77 (!) 146/75 137/62   Pulse: 75 74    Resp:  16    Temp: 98.5 F (36.9 C) 98.5 F (36.9 C)    TempSrc: Oral Oral    SpO2: 100%   97%  Weight:        Intake/Output Summary (Last 24 hours) at 01/29/2018 1114 Last data filed at 01/29/2018 0300 Gross per 24 hour  Intake 1628.33 ml  Output -  Net 1628.33 ml   Filed Weights   01/28/18 1211  Weight: 105.2 kg (232 lb)    General appearance: alert, cooperative, appears stated age and  no distress Head: Normocephalic, without obvious abnormality, atraumatic Resp: clear to auscultation bilaterally Cardio: regular rate and rhythm, S1, S2 normal, no murmur, click, rub or gallop GI: soft, non-tender; bowel sounds normal; no masses,  no organomegaly Extremities: Scab like lesion on the thumb.  Tender to palpation.  Mild erythema noted.  Slightly warm to touch.  No drainage noted. Pulses: 2+ and symmetric Neurologic: No focal neurological deficits.  Lab Results:  Data Reviewed: I have personally reviewed following labs and imaging studies  CBC: Recent Labs  Lab 01/28/18 1237  WBC 3.5*  NEUTROABS 2.0  HGB 10.1*  HCT 31.2*  MCV 96.6  PLT 563    Basic Metabolic Panel: Recent Labs  Lab 01/28/18 1237 01/29/18 0508  NA 138 139  K 3.9 4.2  CL 107 107  CO2 20* 22  GLUCOSE 172* 84  BUN 12 12  CREATININE 0.93 0.87  CALCIUM 8.6* 8.5*    GFR: Estimated Creatinine Clearance: 76.9 mL/min (by C-G formula based on SCr of 0.87 mg/dL).  HbA1C: Recent Labs    01/28/18 1237  HGBA1C 7.1*    CBG: Recent Labs  Lab 01/28/18 2127 01/29/18 0748  GLUCAP 151* 80     Recent Results (from the past 240 hour(s))  Wound or Superficial Culture     Status: Abnormal   Collection Time: 01/25/18  2:42 PM  Result Value Ref Range Status  Specimen Description ABSCESS THUMB  Final   Special Requests Immunocompromised  Final   Gram Stain   Final    RARE WBC PRESENT, PREDOMINANTLY MONONUCLEAR MODERATE GRAM POSITIVE COCCI RARE YEAST Performed at [facility], [place] [address] [place], [place] [address]    Culture MULTIPLE ORGANISMS PRESENT, NONE PREDOMINANT (A)  Final   Report Status 01/28/2018 FINAL  Final      Radiology Studies: Dg Chest 2 View  Result Date: 01/28/2018 CLINICAL DATA:  Chest pain EXAM: CHEST - 2 VIEW COMPARISON:  Very 02/03/2018 FINDINGS: No edema or consolidation. Heart is upper normal in size with pulmonary vascularity normal. Patient is status  post coronary artery bypass grafting. No adenopathy. No bone lesions. IMPRESSION: Status post coronary artery bypass grafting. No edema or consolidation. Heart upper normal in size. Electronically Signed   By: Lowella Grip III M.D.   On: 01/28/2018 13:20   Dg Hand Complete Right  Result Date: 01/28/2018 CLINICAL DATA:  Thumb wound. EXAM: RIGHT HAND - COMPLETE 3+ VIEW COMPARISON:  None. FINDINGS: Round, partially calcified 2.2 cm lesion along the ulnar aspect of the first proximal phalanx and IP joint. This does not clearly involve the underlying bone. Moderate osteoarthritis of the first [facility] joint with mild subluxation. Remaining joint spaces are preserved. Diffuse osteopenia. IMPRESSION: 1. Round, partially calcified 2.2 cm lesion along the ulnar aspect of the first IP joint without definite involvement of the underlying bone. This is nonspecific, but could reflect chronic indolent granulomatous infection. Recommend non-emergent MRI with and without contrast to better characterize and evaluate for a discrete mass or underlying bony involvement. 2. 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 Electronically Signed   By: Titus Dubin M.D.   On: 01/28/2018 14:05     Medications:  Scheduled: . azaTHIOprine  125 mg Oral QPM  . calcitRIOL  0.25 mcg Oral Q3 days  . carbamazepine  200 mg Oral BID  . clopidogrel  75 mg Oral Daily  . diltiazem  120 mg Oral Daily  . DULoxetine  60 mg Oral q morning - 10a  . erythromycin base  500 mg Oral Daily  . famotidine  20 mg Oral Daily  . ferrous sulfate  325 mg Oral BID  . fluticasone  2 spray Each Nare BID  . glimepiride  4 mg Oral q morning - 10a  . insulin glargine  12 Units Subcutaneous Daily  . LORazepam  1 mg Oral q morning - 10a  . metFORMIN  500 mg Oral BID WC  . metoprolol tartrate  25 mg Oral Daily  . mometasone-formoterol  2 puff Inhalation BID  . montelukast  10 mg Oral QHS  . pantoprazole  40 mg Oral Daily  . predniSONE  5 mg Oral Q  breakfast  . rosuvastatin  20 mg Oral Daily  . sertraline  100 mg Oral q morning - 10a   Continuous: . fluconazole (DIFLUCAN) IV Stopped (01/28/18 1840)  . sodium chloride    . vancomycin Stopped (01/29/18 0242)   XQJ:JHERDEYCXKGYJ, albuterol, cyclobenzaprine, nitroGLYCERIN, ondansetron **OR** ondansetron (ZOFRAN) IV, oxyCODONE  Assessment/Plan:    Right thumb lesion with superimposed cellulitis There was concern for mild sepsis due to elevated lactic acid levels.  However she does not have sepsis physiology currently.  Patient's right thumb lesion has been present for about a month.  It has increased in size.  Etiology remains unclear.  The lesion may need surgical excision.  Hand surgery has been consulted.  Deferred to them.  She was started on vancomycin and Zosyn and fluconazole was added due to presence of yeast in previous culture.  Continue only vancomycin and Diflucan for now.  Discontinue Zosyn.  Chest discomfort Patient was complaining of cough and congestion.  Chest discomfort was thought to be related to that.  No infiltrates noted on chest x-ray.  Denies any complaints this morning.  Her troponin was normal.  Continue to watch for now.  Diabetes mellitus type 2 Continue SSI.  Lantus.  Hold oral agents for now.  HbA1c 7.1.  Noted to be on erythromycin.  Wonder if this is for gastroparesis?  Essential hypertension BP is reasonably well controlled.  Continue to watch for now.  History of paroxysmal atrial fibrillation Noted to be on metoprolol and diltiazem.  Continue.  Not noted to be on anticoagulation.  Is on Plavix which is being continued.    History of obstructive sleep apnea CPAP  Dyslipidemia Continue statin.  Status post kidney transplant Stable.  Creatinine is normal.  Continue home medications.  She is on chronic steroids.  She is on azathioprine.  History of depression Continue home medications.  History of coronary artery disease status post  CABG Cardiac status appears to be stable.  EKG done at the time of admission did show nonspecific T wave changes seen previously as well.  Continue home medications.  DVT Prophylaxis: SCDs    Code Status: Full code Family Communication: Discussed with patient Disposition Plan: Await hand surgery input.  Continue management as outlined above.  Mobilize as tolerated.    LOS: 1 day   [facility] Pager [phone] 01/29/2018, 11:14 AM  If 7PM-7AM, please contact night-coverage at [email or web address], password [facility]

## 2018-01-29 NOTE — Consult Note (Signed)
Reason for Consult:?finger infection Referring Physician: ER  CC: I have this thing on my thumb  HPI:  Katherine [name] is an 62 y.o. right handed female who presents with open wound R thumb.  Pt states she had a "callous" on thumb ~1 month ago, denies trauma, then callous became erythematous, painful.  She has been cleaning with peroxide at home.  Pt states that she tried to get in to see another Hand Specialist, but was told they could not see her?!  Recently was seen at Urgent care, sounds like wound cauterized with silver nitrate, pt put on Doxycycline .   Pain is rated at   8/10 and is described as sharp.  Pain is constant.  Pain is made better by rest/immobilization, worse with motion.   Associated signs/symptoms: Previous treatment:    Past Medical History:  Diagnosis Date  . Anemia   . Anxiety   . Bilateral carotid artery stenosis    Carotid duplex 02/2016: 1-39% LICA, 60-79% RICA, >50% RECA, f/u 1 yr suggested  . CAD (coronary artery disease) of bypass graft 5/01; 3/'02, 8/'03, 10/'04; 1/15   PCI x 5 to SVG-D1   . CAD in native artery 07/1993    3 Vessel Disease (LAD-D1 & RCA) -- CABG  . CAD S/P percutaneous coronary angioplasty    PCI to SVG-D1 insertion/native D1 x 4 = '01 -(S660 BMS 2.5 x 9 - insertion into D1; '02 - distal overlap ACS Pixel 2.5 x 8  BMS; '03 distal/native ISR/Thrombosis - Pixel 2.5 x 13; '04 - ISR-  Taxus 2.5 x 20 (covered all);; 1/15 - mid SVG-D1 (50% distal ISR) - Promus P 2.75 x 20 -- 2.8 mm  . COPD mixed type (HCC)    Followed by Dr. Byrum "pulmonologist said no COPD"  . Depression   . Depression with anxiety   . Diabetes mellitus type 2 in obese (HCC)   . Diarrhea    started after cholecystectomy and mass removed from intestine  . Dyslipidemia, goal LDL below 70    08/2012: TC 137, TG 200, HDL 32!, LDL 45; on statin (followed by Dr.Deterding)  . ESRD (end stage renal disease) (HCC) 1991   s/p Cadaveric Renal Transplant ([facility] - Dr. Deterding)   .  Family history of adverse reaction to anesthesia    mom's bp dropped during/after anesthesia  . Fibromyalgia   . GERD (gastroesophageal reflux disease)   . Glomerulonephritis, chronic, rapidly progressive 1989  . 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 07/1993   Rescue PTCA of RCA -- referred for CABG.  . H/O: GI bleed   . Headache    migraines in the past  . History of kidney stones   . Hypertension associated with diabetes (HCC)   . MRSA (methicillin resistant staph aureus) culture positive   . Obesity    s/p Lap Band 04/2004- Port Replacement 10/09 & 2/10 for infection; Dr. Hoxworth.  . OSA (obstructive sleep apnea)    no longer on CPAP or home O2, states she doesn't need now after lap band  . PAD (peripheral artery disease) (HCC) 08/2013   LEA Dopplers to be read by Dr. Arida  . PAF (paroxysmal atrial fibrillation) (HCC) 06/2014   Noted on CardioNet Monitor  - rate ~112; no recurrent symptoms. Nonionic regulation because of frequent GI procedures. Patient prefers Plavix  . Pneumonia   . Recurrent boils    Bilateral Groin  . Rheumatoid arthritis (HCC)    Per Patient   Report; associated with OA  . 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 Servando Snare: LIMA-LAD, SVG-bifurcatingD1, SVG-rPDA  . S/p cadaver renal transplant [facility]  . Stroke [facility]) 2012   "right eye stroke- half blind now"  . Unstable angina ([facility]) 5/01; 3/'02, 8/'03, 10/'04; 1/15   x 5 occurences since Inf-Post STEMI in 1994    Past Surgical History:  Procedure Laterality Date  . CARDIAC CATHETERIZATION  5/'01, 3/'02, 8/'03, 10/'04; 1/'15   08/22/2013: LAD & RCA 100%; LIMA-LAD & SVG-rPDA patent; Cx-- OM1 60%, OM2 ostial ~50%; SVG-D1 - 80% mid, 50% distal ISR --PCI  . CATHETER REMOVAL    . CHOLECYSTECTOMY N/A 10/29/2014   Procedure: LAPAROSCOPIC CHOLECYSTECTOMY WITH INTRAOPERATIVE CHOLANGIOGRAM;  Surgeon: Excell Seltzer, MD;  Location: [facility];  Service: General;  Laterality: N/A;  . CORONARY  ANGIOPLASTY  1994   x5  . CORONARY ARTERY BYPASS GRAFT  1995   LIMA-LAD, SVG-RPDA, SVG-D1  . ESOPHAGOGASTRODUODENOSCOPY N/A 10/15/2016   Procedure: ESOPHAGOGASTRODUODENOSCOPY (EGD);  Surgeon: Wilford Corner, MD;  Location: [facility] ENDOSCOPY;  Service: Endoscopy;  Laterality: N/A;  . INCISE AND DRAIN ABCESS    . KIDNEY TRANSPLANT  1991  . KNEE ARTHROSCOPY WITH LATERAL MENISECTOMY Left 12/03/2017   Procedure: LEFT KNEE ARTHROSCOPY WITH LATERAL MENISECTOMY;  Surgeon: Earlie Server, MD;  Location: [facility];  Service: Orthopedics;  Laterality: Left;  . LAPAROSCOPIC GASTRIC BANDING  04/2004; 10/'09, 2/'10   Port Replacement x 2  . LEFT HEART CATHETERIZATION WITH CORONARY/GRAFT ANGIOGRAM N/A 08/23/2013   Procedure: LEFT HEART CATHETERIZATION WITH Beatrix Fetters;  Surgeon: Wellington Hampshire, MD;  [facility]: [facility];  Service: Cardiovascular;  Laterality: N/A;  . Lower Extremity Arterial Dopplers  08/2013   ABI: R 0.96, L 1.04  . MULTIPLE TOOTH EXTRACTIONS  age 71  . NM MYOVIEW LTD  03/2016   EF 62%. LOW RISK. C/W prior MI - no Ischemia. Apical hypokinesis.  [name] Kitchen PERCUTANEOUS CORONARY STENT INTERVENTION (PCI-S)  5/'01, 3/'02, 8/'03, 10/'04;   '01 - S660 BMS 2.5 x 9 - dSVG-D1 into D1; '02- post-stent stenosis - 2.5 x 8 Pixel BMS; '8\03: ISR/Thrombosis into native D1 - AngioJet, 2.5 x 13 Pixel; '04 - ISR 95% - covered stented area with Taxus DES 2.5 mm x 20 (2.88)  . PERCUTANEOUS CORONARY STENT INTERVENTION (PCI-S)  08/23/2013   Procedure: PERCUTANEOUS CORONARY STENT INTERVENTION (PCI-S);  Surgeon: Wellington Hampshire, MD;  Location: [facility] CATH LAB;  Service: Cardiovascular;;mid SVG-D1 80%; distal stent ~50% ISR; Promus Prermier DES 2.75 mm xc 20 mm (2.8 mm)  . PORT-A-CATH REMOVAL     kidney  . TRANSTHORACIC ECHOCARDIOGRAM  02/2016   EF 55-60%. Septal dyssynergy from CABG GR 2 DD. Aortic valve is trileaflet the functional bicuspid with sclerosis but not yet notable for stenosis stenosis.; no Significant change   . TUBAL LIGATION    . wrist fistula repair Left    dialysis for one year    Family History  Problem Relation Age of Onset  . Cancer Mother        liver  . Heart disease Father   . Cancer Father        colon  . Arrhythmia Brother        Atrial Fibrillation  . Arrhythmia Paternal Aunt        Atrial Fibrillation    Social History:  reports that she quit smoking about 15 years ago. Her smoking use included cigarettes. She has a 30.00 pack-year smoking history. She has  never used smokeless tobacco. She reports that she does not drink alcohol or use drugs.  Allergies:  Allergies  Allergen Reactions  . Tetracycline Hives  . Hydromorphone Hcl Nausea And Vomiting  . Niacin Other (See Comments)    Mouth blisters  . Niaspan [Niacin Er] Other (See Comments)    Mouth blisters  . Sulfa Antibiotics Nausea Only and Other (See Comments)    "Tears up stomach"  . Sulfonamide Derivatives Other (See Comments)    Reaction: per patient "tears her stomach up"  . Codeine Nausea And Vomiting  . Erythromycin Nausea And Vomiting  . Hydromorphone Hcl Nausea And Vomiting  . Morphine And Related Nausea And Vomiting  . Nalbuphine Nausea And Vomiting  . Sulfasalazine Nausea Only and Other (See Comments)    per patient "tears her stomach up", "Tears up stomach"  . Tape Rash and Other (See Comments)    No "plastic" tape," please    Medications: I have reviewed the patient's current medications.  Results for orders placed or performed during the hospital encounter of 01/28/18 (from the past 48 hour(s))  Basic metabolic panel     Status: Abnormal   Collection Time: 01/28/18 12:37 PM  Result Value Ref Range   Sodium 138 135 - 145 mmol/L   Potassium 3.9 3.5 - 5.1 mmol/L   Chloride 107 101 - 111 mmol/L   CO2 20 (L) 22 - 32 mmol/L   Glucose, Bld 172 (H) 65 - 99 mg/dL   BUN 12 6 - 20 mg/dL   Creatinine, Ser 0.93 0.44 - 1.00 mg/dL   Calcium 8.6 (L) 8.9 - 10.3 mg/dL   GFR calc non Af Amer >60 >60  mL/min   GFR calc Af Amer >60 >60 mL/min    Comment: (NOTE) The eGFR has been calculated using the CKD EPI equation. This calculation has not been validated in all clinical situations. eGFR's persistently <60 mL/min signify possible Chronic Kidney Disease.    Anion gap 11 5 - 15    Comment: Performed at [facility], [address] [place], [place] [address]  CBC with Differential     Status: Abnormal   Collection Time: 01/28/18 12:37 PM  Result Value Ref Range   WBC 3.5 (L) 4.0 - 10.5 K/uL   RBC 3.23 (L) 3.87 - 5.11 MIL/uL   Hemoglobin 10.1 (L) 12.0 - 15.0 g/dL   HCT 31.2 (L) 36.0 - 46.0 %   MCV 96.6 78.0 - 100.0 fL   MCH 31.3 26.0 - 34.0 pg   MCHC 32.4 30.0 - 36.0 g/dL   RDW 14.3 11.5 - 15.5 %   Platelets 187 150 - 400 K/uL   Neutrophils Relative % 56 %   Neutro Abs 2.0 1.7 - 7.7 K/uL   Lymphocytes Relative 23 %   Lymphs Abs 0.8 0.7 - 4.0 K/uL   Monocytes Relative 18 %   Monocytes Absolute 0.6 0.1 - 1.0 K/uL   Eosinophils Relative 3 %   Eosinophils Absolute 0.1 0.0 - 0.7 K/uL   Basophils Relative 0 %   Basophils Absolute 0.0 0.0 - 0.1 K/uL   Immature Granulocytes 0 %   Abs Immature Granulocytes 0.0 0.0 - 0.1 K/uL    Comment: Performed at [facility], [address] [place], [place] [address]  Hemoglobin A1c     Status: Abnormal   Collection Time: 01/28/18 12:37 PM  Result Value Ref Range   Hgb A1c MFr Bld 7.1 (H) 4.8 - 5.6 %      Comment: (NOTE) Pre diabetes:          5.7%-6.4% Diabetes:              >6.4% Glycemic control for   <7.0% adults with diabetes    Mean Plasma Glucose 157.07 mg/dL    Comment: Performed at [facility], [address] [place], [place] [address]  I-stat troponin, ED     Status: None   Collection Time: 01/28/18 12:53 PM  Result Value Ref Range   Troponin i, poc 0.01 0.00 - 0.08 ng/mL   Comment 3            Comment: Due to the release kinetics of cTnI, a negative result within the first hours of the onset of symptoms  does not rule out myocardial infarction with certainty. If myocardial infarction is still suspected, repeat the test at appropriate intervals.   I-Stat CG4 Lactic Acid, ED     Status: Abnormal   Collection Time: 01/28/18 12:55 PM  Result Value Ref Range   Lactic Acid, Venous 2.71 (HH) 0.5 - 1.9 mmol/L   Comment NOTIFIED PHYSICIAN   CG4 I-STAT (Lactic acid)     Status: None   Collection Time: 01/28/18  6:04 PM  Result Value Ref Range   Lactic Acid, Venous 1.57 0.5 - 1.9 mmol/L  Glucose, capillary     Status: Abnormal   Collection Time: 01/28/18  9:27 PM  Result Value Ref Range   Glucose-Capillary 151 (H) 65 - 99 mg/dL  Urinalysis, Routine w reflex microscopic     Status: None   Collection Time: 01/28/18 11:18 PM  Result Value Ref Range   Color, Urine YELLOW YELLOW   APPearance CLEAR CLEAR   Specific Gravity, Urine 1.015 1.005 - 1.030   pH 5.0 5.0 - 8.0   Glucose, UA NEGATIVE NEGATIVE mg/dL   Hgb urine dipstick NEGATIVE NEGATIVE   Bilirubin Urine NEGATIVE NEGATIVE   Ketones, ur NEGATIVE NEGATIVE mg/dL   Protein, ur NEGATIVE NEGATIVE mg/dL   Nitrite NEGATIVE NEGATIVE   Leukocytes, UA NEGATIVE NEGATIVE    Comment: Performed at [facility], [address] [place], [place] [address]  Basic metabolic panel     Status: Abnormal   Collection Time: 01/29/18  5:08 AM  Result Value Ref Range   Sodium 139 135 - 145 mmol/L   Potassium 4.2 3.5 - 5.1 mmol/L   Chloride 107 101 - 111 mmol/L   CO2 22 22 - 32 mmol/L   Glucose, Bld 84 65 - 99 mg/dL   BUN 12 6 - 20 mg/dL   Creatinine, Ser 0.87 0.44 - 1.00 mg/dL   Calcium 8.5 (L) 8.9 - 10.3 mg/dL   GFR calc non Af Amer >60 >60 mL/min   GFR calc Af Amer >60 >60 mL/min    Comment: (NOTE) The eGFR has been calculated using the CKD EPI equation. This calculation has not been validated in all clinical situations. eGFR's persistently <60 mL/min signify possible Chronic Kidney Disease.    Anion gap 10 5 - 15    Comment: Performed at  [facility], [address] [place], [place] [address]  Glucose, capillary     Status: None   Collection Time: 01/29/18  7:48 AM  Result Value Ref Range   Glucose-Capillary 80 65 - 99 mg/dL    Dg Chest 2 View  Result Date: 01/28/2018 CLINICAL DATA:  Chest pain EXAM: CHEST - 2 VIEW COMPARISON:  Very 02/03/2018 FINDINGS: No edema or   consolidation. Heart is upper normal in size with pulmonary vascularity normal. Patient is status post coronary artery bypass grafting. No adenopathy. No bone lesions. IMPRESSION: Status post coronary artery bypass grafting. No edema or consolidation. Heart upper normal in size. Electronically Signed   By: William  Woodruff III M.D.   On: 01/28/2018 13:20   Dg Hand Complete Right  Result Date: 01/28/2018 CLINICAL DATA:  Thumb wound. EXAM: RIGHT HAND - COMPLETE 3+ VIEW COMPARISON:  None. FINDINGS: Round, partially calcified 2.2 cm lesion along the ulnar aspect of the first proximal phalanx and IP joint. This does not clearly involve the underlying bone. Moderate osteoarthritis of the first CMC joint with mild subluxation. Remaining joint spaces are preserved. Diffuse osteopenia. IMPRESSION: 1. Round, partially calcified 2.2 cm lesion along the ulnar aspect of the first IP joint without definite involvement of the underlying bone. This is nonspecific, but could reflect chronic indolent granulomatous infection. Recommend non-emergent MRI with and without contrast to better characterize and evaluate for a discrete mass or underlying bony involvement. 2. 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 Electronically Signed   By: William T Derry M.D.   On: 01/28/2018 14:05    Pertinent items are noted in HPI. Temp:  [98 F (36.7 C)-98.6 F (37 C)] 98.5 F (36.9 C) (06/15 0436) Pulse Rate:  [64-76] 74 (06/15 0436) Resp:  [15-19] 16 (06/15 0436) BP: (128-161)/(62-90) 137/62 (06/15 0634) SpO2:  [97 %-100 %] 97 % (06/15 0907) Weight:  [105.2 kg (232 lb)] 105.2 kg (232 lb)  (06/14 1211) General appearance: alert and cooperative Resp: clear to auscultation bilaterally Cardio: regular rate and rhythm Extremities: R thumb with ~1.5cm round dorsal lesion - looks as if recently cauterized with silver nitrate.  with pressure on lesion some whitish material expressed, ? pus, minimal proximal erythema, able to flex/extend ipj without discomfort, no flexor tenosynovitis;    Assessment: ? Lesion R thumb  Plan: Will plan excision/biopsy.  Discussed difficulty in closing wound, may have to do skin graft...most likely will require further surgery.    C  01/29/2018, 11:12 AM     

## 2018-01-29 NOTE — Progress Notes (Signed)
Dressing change completed. Right thumb cleaned with normal saline. Wrapped with vasoline gauze, gauze, and taped. Pt tolerated well.

## 2018-01-29 NOTE — Progress Notes (Signed)
Lab called. Will draw some more blood.

## 2018-01-29 NOTE — Anesthesia Procedure Notes (Signed)
Procedure Name: Intubation Date/Time: 01/29/2018 5:02 PM Performed by: Suzy Bouchard, CRNA Pre-anesthesia Checklist: Emergency Drugs available, Suction available, Patient being monitored, Timeout performed and Patient identified Patient Re-evaluated:Patient Re-evaluated prior to induction Oxygen Delivery Method: Circle system utilized Preoxygenation: Pre-oxygenation with 100% oxygen Induction Type: IV induction and Rapid sequence Laryngoscope Size: Miller and 2 Grade View: Grade I Tube type: Oral Tube size: 7.0 mm Number of attempts: 1 Airway Equipment and Method: Stylet Placement Confirmation: ETT inserted through vocal cords under direct vision,  positive ETCO2 and breath sounds checked- equal and bilateral Secured at: 22 cm Tube secured with: Tape Dental Injury: Teeth and Oropharynx as per pre-operative assessment

## 2018-01-29 NOTE — Op Note (Signed)
NAME: Katherine [name], Katherine [name] MEDICAL RECORD [id number] ACCOUNT [id number] DATE OF BIRTH:1956/05/30 FACILITY: [facility] LOCATION: [facility] PHYSICIAN:Jamail Cullers Vivien Presto, MD  OPERATIVE REPORT  DATE OF PROCEDURE:  01/29/2018  PREOPERATIVE DIAGNOSES:  Infection, questionable mass of the right thumb.  POSTOPERATIVE DIAGNOSES:  Infection, questionable mass of the right thumb.  PROCEDURE:  Excision of infected mass, right thumb.  ANESTHESIA:  General.  COMPLICATIONS:  No acute complications.  SPECIMENS:  Sent to pathology for identification.  INDICATIONS:  The patient is a 62 year old female who has had a long history of some kind of callus-type mass on the right thumb.  It has progressively become inflamed with signs and symptoms of infection.  She was taken to the operating room for I&D / excision of this lesion.  DESCRIPTION OF PROCEDURE:  The patient was taken to the operating room and placed supine on the operating room table.  General anesthesia was administered without difficulty.  The right upper extremity was prepped and draped in the normal sterile fashion.  A  tourniquet was used on the forearm.  The mass was evaluated. Superficial debridement was performed, removing the top layer of the lesion; the lesion appeared to be similar in appearance to a wart.  An incision was  made through the skin around the periphery of the lesion, and with a curette, the lesion was removed.  The lesion extended all the way down to the extensor tendon and ulnarly on the dorsal aspect of the thumb.  This tissue was passed off to be sent to  pathology for identification. The wound bed measuring ~2cm round was cauterized with bipolar.   Marcaine  was infiltrated around the base of the thumb for postoperative pain control.  A small piece of Mepitel was placed over the wound, as well as a sterile dressing and Coban.  The patient tolerated the procedure well and was taken to recovery room  stable.  LN/NUANCE   D:01/29/2018 T:01/29/2018 JOB:[id number]

## 2018-01-29 NOTE — Anesthesia Postprocedure Evaluation (Signed)
Anesthesia Post Note  Patient: Jamieka Royle  Procedure(s) Performed: IRRIGATION AND DEBRIDEMENT THUMB (Right Thumb)     Patient location during evaluation: PACU Anesthesia Type: General Level of consciousness: awake and alert Pain management: pain level controlled Vital Signs Assessment: post-procedure vital signs reviewed and stable Respiratory status: spontaneous breathing, nonlabored ventilation, respiratory function stable and patient connected to nasal cannula oxygen Cardiovascular status: blood pressure returned to baseline and stable Postop Assessment: no apparent nausea or vomiting Anesthetic complications: no    Last Vitals:  Vitals:   01/29/18 1800 01/29/18 1815  BP: (!) 105/57   Pulse: 79 79  Resp: 15 15  Temp:    SpO2: 94% 94%    Last Pain:  Vitals:   01/29/18 1800  TempSrc:   PainSc: 0-No pain                 [name]

## 2018-01-29 NOTE — Anesthesia Preprocedure Evaluation (Signed)
Anesthesia Evaluation  Patient identified by MRN, date of birth, ID band Patient awake    Reviewed: Allergy & Precautions, NPO status , Patient's Chart, lab work & pertinent test results, reviewed documented beta blocker date and time   Airway Mallampati: II  TM Distance: >3 FB Neck ROM: Full    Dental  (+) Dental Advisory Given   Pulmonary asthma , sleep apnea , COPD, former smoker,    breath sounds clear to auscultation       Cardiovascular hypertension, Pt. on medications and Pt. on home beta blockers + CAD, + Cardiac Stents, + CABG and + Peripheral Vascular Disease   Rhythm:Regular Rate:Normal     Neuro/Psych  Headaches, Anxiety Depression  Neuromuscular disease CVA    GI/Hepatic Neg liver ROS, GERD  ,  Endo/Other  diabetes, Type 2Morbid obesity  Renal/GU Renal disease     Musculoskeletal  (+) Arthritis , Fibromyalgia -  Abdominal   Peds  Hematology  (+) anemia ,   Anesthesia Other Findings   Reproductive/Obstetrics                            10/2017 Echo: Study Conclusions  - Left ventricle: The cavity size was normal. Wall thickness was  increased in a pattern of moderate LVH. Systolic function was  normal. The estimated ejection fraction was in the range of 55%   to 60%. Wall motion was normal; there were no regional wall  motion abnormalities. Features are consistent with a pseudonormal  left ventricular filling pattern, with concomitant abnormal   relaxation and increased filling pressure (grade 2 diastolic  dysfunction). - Aortic valve: Mildly calcified annulus. Moderately thickened,  moderately calcified leaflets. There was mild stenosis. Valve   area (VTI): 1.93 cm^2. Valve area (Vmax): 1.9 cm^2. Valve area  (Vmean): 1.79 cm^2 . Lab Results  Component Value Date   WBC 2.8 (L) 01/29/2018   HGB 9.2 (L) 01/29/2018   HCT 28.3 (L) 01/29/2018   MCV 95.9 01/29/2018   PLT 184  01/29/2018   Lab Results  Component Value Date   CREATININE 0.87 01/29/2018   BUN 12 01/29/2018   NA 139 01/29/2018   K 4.2 01/29/2018   CL 107 01/29/2018   CO2 22 01/29/2018    Anesthesia Physical  Anesthesia Plan  ASA: III  Anesthesia Plan: General   Post-op Pain Management:    Induction: Intravenous  PONV Risk Score and Plan: 3 and Ondansetron, Dexamethasone and Treatment may vary due to age or medical condition  Airway Management Planned: Oral ETT  Additional Equipment:   Intra-op Plan:   Post-operative Plan: Extubation in OR  Informed Consent: I have reviewed the patients History and Physical, chart, labs and discussed the procedure including the risks, benefits and alternatives for the proposed anesthesia with the patient or authorized representative who has indicated his/her understanding and acceptance.   Dental advisory given  Plan Discussed with: CRNA  Anesthesia Plan Comments:         Anesthesia Quick Evaluation

## 2018-01-29 NOTE — Transfer of Care (Signed)
Immediate Anesthesia Transfer of Care Note  Patient: Katherine [name]  Procedure(s) Performed: IRRIGATION AND DEBRIDEMENT THUMB (Right Thumb)  Patient Location: PACU  Anesthesia Type:General  Level of Consciousness: awake  Airway & Oxygen Therapy: Patient Spontanous Breathing and Patient connected to nasal cannula oxygen  Post-op Assessment: Report given to RN, Post -op Vital signs reviewed and stable and Patient moving all extremities  Post vital signs: Reviewed and stable  Last Vitals:  Vitals Value Taken Time  BP 102/46 01/29/2018  5:38 PM  Temp    Pulse 76 01/29/2018  5:40 PM  Resp 18 01/29/2018  5:40 PM  SpO2 94 % 01/29/2018  5:40 PM  Vitals shown include unvalidated device data.  Last Pain:  Vitals:   01/29/18 1338  TempSrc: Oral  PainSc:       Patients Stated Pain Goal: 2 (43/27/61 4709)  Complications: No apparent anesthesia complications

## 2018-01-30 ENCOUNTER — Encounter (HOSPITAL_COMMUNITY): Payer: Self-pay | Admitting: General Surgery

## 2018-01-30 DIAGNOSIS — I480 Paroxysmal atrial fibrillation: Secondary | ICD-10-CM

## 2018-01-30 DIAGNOSIS — J209 Acute bronchitis, unspecified: Secondary | ICD-10-CM

## 2018-01-30 LAB — CBC
HEMATOCRIT: 27.6 % — AB (ref 36.0–46.0)
HEMOGLOBIN: 9 g/dL — AB (ref 12.0–15.0)
MCH: 31.5 pg (ref 26.0–34.0)
MCHC: 32.6 g/dL (ref 30.0–36.0)
MCV: 96.5 fL (ref 78.0–100.0)
Platelets: 193 10*3/uL (ref 150–400)
RBC: 2.86 MIL/uL — ABNORMAL LOW (ref 3.87–5.11)
RDW: 14 % (ref 11.5–15.5)
WBC: 2.9 10*3/uL — AB (ref 4.0–10.5)

## 2018-01-30 LAB — GLUCOSE, CAPILLARY
GLUCOSE-CAPILLARY: 185 mg/dL — AB (ref 65–99)
GLUCOSE-CAPILLARY: 223 mg/dL — AB (ref 65–99)
GLUCOSE-CAPILLARY: 149 mg/dL — AB (ref 65–99)
Glucose-Capillary: 275 mg/dL — ABNORMAL HIGH (ref 65–99)

## 2018-01-30 MED ORDER — LEVOFLOXACIN IN D5W 750 MG/150ML IV SOLN
750.0000 mg | INTRAVENOUS | Status: DC
  Administered 2018-01-30: 750 mg via INTRAVENOUS
  Filled 2018-01-30: qty 150

## 2018-01-30 MED ORDER — FLUCONAZOLE 200 MG PO TABS
200.0000 mg | ORAL_TABLET | Freq: Every day | ORAL | Status: DC
  Administered 2018-01-30 – 2018-01-31 (×2): 200 mg via ORAL
  Filled 2018-01-30 (×3): qty 1

## 2018-01-30 MED ORDER — LOPERAMIDE HCL 2 MG PO CAPS
4.0000 mg | ORAL_CAPSULE | Freq: Two times a day (BID) | ORAL | Status: DC | PRN
Start: 2018-01-30 — End: 2018-02-02
  Administered 2018-01-30 – 2018-02-01 (×4): 4 mg via ORAL
  Filled 2018-01-30 (×4): qty 2

## 2018-01-30 MED ORDER — CEFDINIR 300 MG PO CAPS
300.0000 mg | ORAL_CAPSULE | Freq: Two times a day (BID) | ORAL | Status: DC
  Administered 2018-01-31 – 2018-02-02 (×5): 300 mg via ORAL
  Filled 2018-01-30 (×5): qty 1

## 2018-01-30 MED ORDER — GUAIFENESIN 100 MG/5ML PO SOLN
5.0000 mL | ORAL | Status: DC | PRN
  Administered 2018-02-02: 100 mg via ORAL
  Filled 2018-01-30: qty 25

## 2018-01-30 MED ORDER — GUAIFENESIN ER 600 MG PO TB12
600.0000 mg | ORAL_TABLET | Freq: Two times a day (BID) | ORAL | Status: DC
  Administered 2018-01-30 – 2018-02-02 (×7): 600 mg via ORAL
  Filled 2018-01-30 (×7): qty 1

## 2018-01-30 MED ORDER — INSULIN ASPART 100 UNIT/ML ~~LOC~~ SOLN
0.0000 [IU] | Freq: Three times a day (TID) | SUBCUTANEOUS | Status: DC
  Administered 2018-01-30: 8 [IU] via SUBCUTANEOUS
  Administered 2018-01-30: 5 [IU] via SUBCUTANEOUS
  Administered 2018-01-30: 3 [IU] via SUBCUTANEOUS
  Administered 2018-01-31: 5 [IU] via SUBCUTANEOUS
  Administered 2018-01-31 – 2018-02-01 (×2): 3 [IU] via SUBCUTANEOUS
  Administered 2018-02-01: 8 [IU] via SUBCUTANEOUS

## 2018-01-30 NOTE — Progress Notes (Addendum)
[facility] PROGRESS NOTE  Katherine [name] PQZ:[id number] DOB: 09-29-1955 DOA: 01/28/2018  PCP: [name], MD  Brief History/Interval Summary: 62 year old Caucasian female with a past medical history of coronary artery disease status post CABG, diabetes mellitus type 2, morbid obesity status post lap band surgery, history of kidney transplant presented with right thumb pain.  She has had a scab-like lesion there since a month.  This has been progressively getting worse.  About a week ago she noticed that the skin was red.  She went to urgent care.  They drew some cultures from that area and applied some kind of lotion.  She was prescribed doxycycline.  However her symptoms have not improved.  She experience worsening pain.  There is also some redness and streaking on her wrist area.  So she decided to come back to the hospital.  There was also some complains about shortness of breath and chest discomfort.  Reason for Visit: Right thumb lesion, cellulitis  Consultants: Dr. Lenon Curt with hand surgery  Procedures: Excision of the lesion from right thumb  Antibiotics: Patient was initially placed on vancomycin and Zosyn and Diflucan. Zosyn was discontinued Initially Levaquin was ordered for possible bronchitis versus pneumonia.  Switched over to Cefdinir  Subjective/Interval History: Patient continues to complain of a cough.  Some discomfort in her chest with deep breathing.  Has a yellowish expectoration.  Right hand is covered in dressing.  ROS: Denies any nausea or vomiting  Objective:  Vital Signs  Vitals:   01/29/18 1947 01/29/18 2134 01/30/18 0527 01/30/18 1056  BP: (!) 126/47 (!) 123/53 115/60 (!) 148/68  Pulse: 82 84 76   Resp: 14 15 13    Temp: 98.1 F (36.7 C)  98.5 F (36.9 C)   TempSrc: Oral  Oral   SpO2: 97% 95% 99%   Weight:        Intake/Output Summary (Last 24 hours) at 01/30/2018 1209 Last data filed at 01/30/2018 1100 Gross per 24 hour  Intake  1800 ml  Output 8 ml  Net 1792 ml   Filed Weights   01/28/18 1211  Weight: 105.2 kg (232 lb)    General appearance: Awake alert.  In no distress. Resp: Clear to auscultation bilaterally.  No wheezing rales or rhonchi. Cardio: S1-S2 is normal regular.  No S3-S4.  No rubs murmurs or bruit GI: Abdomen is soft.  Nontender nondistended.  Bowel sounds are present.  No masses organomegaly Extremities: Right hand covered in dressing  pulses: 2+ and symmetric Neurologic: No focal neurological deficits.  Lab Results:  Data Reviewed: I have personally reviewed following labs and imaging studies  CBC: Recent Labs  Lab 01/28/18 1237 01/29/18 1058 01/30/18 1000  WBC 3.5* 2.8* 2.9*  NEUTROABS 2.0  --   --   HGB 10.1* 9.2* 9.0*  HCT 31.2* 28.3* 27.6*  MCV 96.6 95.9 96.5  PLT 187 184 335    Basic Metabolic Panel: Recent Labs  Lab 01/28/18 1237 01/29/18 0508  NA 138 139  K 3.9 4.2  CL 107 107  CO2 20* 22  GLUCOSE 172* 84  BUN 12 12  CREATININE 0.93 0.87  CALCIUM 8.6* 8.5*    GFR: Estimated Creatinine Clearance: 76.9 mL/min (by C-G formula based on SCr of 0.87 mg/dL).  HbA1C: Recent Labs    01/28/18 1237  HGBA1C 7.1*    CBG: Recent Labs  Lab 01/29/18 1534 01/29/18 1743 01/29/18 2133 01/30/18 0652 01/30/18 1108  GLUCAP 152* 108* 291* 185* 275*  Recent Results (from the past 240 hour(s))  Wound or Superficial Culture     Status: Abnormal   Collection Time: 01/25/18  2:42 PM  Result Value Ref Range Status   Specimen Description ABSCESS THUMB  Final   Special Requests Immunocompromised  Final   Gram Stain   Final    RARE WBC PRESENT, PREDOMINANTLY MONONUCLEAR MODERATE GRAM POSITIVE COCCI RARE YEAST Performed at [facility], [address] [place], [place] [address]    Culture MULTIPLE ORGANISMS PRESENT, NONE PREDOMINANT (A)  Final   Report Status 01/28/2018 FINAL  Final  Blood Culture (routine x 2)     Status: None (Preliminary result)    Collection Time: 01/28/18  2:45 PM  Result Value Ref Range Status   Specimen Description BLOOD LEFT HAND  Final   Special Requests   Final    BOTTLES DRAWN AEROBIC AND ANAEROBIC Blood Culture adequate volume   Culture   Final    NO GROWTH 2 DAYS Performed at [facility], [place] [address], [place], [place] [address]    Report Status PENDING  Incomplete  Blood Culture (routine x 2)     Status: None (Preliminary result)   Collection Time: 01/28/18  3:00 PM  Result Value Ref Range Status   Specimen Description BLOOD RIGHT ANTECUBITAL  Final   Special Requests   Final    BOTTLES DRAWN AEROBIC AND ANAEROBIC Blood Culture results may not be optimal due to an inadequate volume of blood received in culture bottles   Culture   Final    NO GROWTH 2 DAYS Performed at [facility], [place] [address], [place], [place] [address]    Report Status PENDING  Incomplete  Surgical pcr screen     Status: Abnormal   Collection Time: 01/29/18 11:56 AM  Result Value Ref Range Status   MRSA, PCR NEGATIVE NEGATIVE Final   Staphylococcus aureus POSITIVE (A) NEGATIVE Final    Comment: (NOTE) The Xpert SA Assay (FDA approved for NASAL specimens in patients 24 years of age and older), is one component of a comprehensive surveillance program. It is not intended to diagnose infection nor to guide or monitor treatment. Performed at [facility], [place] [address] [place], [place] [address]       Radiology Studies: Dg Chest 2 View  Result Date: 01/28/2018 CLINICAL DATA:  Chest pain EXAM: CHEST - 2 VIEW COMPARISON:  Very 02/03/2018 FINDINGS: No edema or consolidation. Heart is upper normal in size with pulmonary vascularity normal. Patient is status post coronary artery bypass grafting. No adenopathy. No bone lesions. IMPRESSION: Status post coronary artery bypass grafting. No edema or consolidation. Heart upper normal in size. Electronically Signed   By: Lowella Grip III M.D.   On: 01/28/2018  13:20   Dg Hand Complete Right  Result Date: 01/28/2018 CLINICAL DATA:  Thumb wound. EXAM: RIGHT HAND - COMPLETE 3+ VIEW COMPARISON:  None. FINDINGS: Round, partially calcified 2.2 cm lesion along the ulnar aspect of the first proximal phalanx and IP joint. This does not clearly involve the underlying bone. 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 Remaining joint spaces are preserved. Diffuse osteopenia. IMPRESSION: 1. Round, partially calcified 2.2 cm lesion along the ulnar aspect of the first IP joint without definite involvement of the underlying bone. This is nonspecific, but could reflect chronic indolent granulomatous infection. Recommend non-emergent MRI with and without contrast to better characterize and evaluate for a discrete mass or  underlying bony involvement. 2. 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 Electronically Signed   By: Titus Dubin M.D.   On: 01/28/2018 14:05     Medications:  Scheduled: . azaTHIOprine  125 mg Oral QPM  . calcitRIOL  0.25 mcg Oral Q3 days  . carbamazepine  200 mg Oral BID  . [START ON 01/31/2018] cefdinir  300 mg Oral Q12H  . clopidogrel  75 mg Oral Daily  . diltiazem  120 mg Oral Daily  . DULoxetine  60 mg Oral q morning - 10a  . famotidine  20 mg Oral Daily  . ferrous sulfate  325 mg Oral BID  . fluconazole  200 mg Oral q1800  . fluticasone  2 spray Each Nare BID  . guaiFENesin  600 mg Oral BID  . insulin aspart  0-15 Units Subcutaneous TID WC  . insulin glargine  12 Units Subcutaneous Daily  . LORazepam  1 mg Oral q morning - 10a  . metoprolol tartrate  25 mg Oral Daily  . mometasone-formoterol  2 puff Inhalation BID  . montelukast  10 mg Oral QHS  . pantoprazole  40 mg Oral Daily  . predniSONE  5 mg Oral Q breakfast  . rosuvastatin  20 mg Oral Daily  . sertraline  100 mg Oral q morning - 10a   Continuous: . sodium chloride    . vancomycin 1,250 mg (01/30/18 1032)   FBP:ZWCHENIDPOEUM, albuterol,  cyclobenzaprine, guaiFENesin, loperamide, nitroGLYCERIN, ondansetron **OR** ondansetron (ZOFRAN) IV, oxyCODONE, promethazine  Assessment/Plan:    Right thumb lesion with superimposed cellulitis There was concern for mild sepsis due to elevated lactic acid levels.  Patient does not have sepsis physiology.  Patient's right thumb lesion has been present for about a month.  It has increased in size.  Etiology not clear.  Patient seen by hand surgery.  Underwent surgical excision on 6/15.  Lesion sent for pathology.  Continue vancomycin and fluconazole for now.    Cough/chest congestion/chest discomfort Symptoms are somewhat pleuritic.  Lungs are clear to auscultation bilaterally.  May be patient has an element of bronchitis.  Place her on Cefdinir.  Mucinex.  Robitussin as needed.  Chest x-ray did not show any acute infiltrates.  Diabetes mellitus type 2 Continue Lantus and SSI.  HbA1c 7.1.  Noted to be on erythromycin.  Wonder if this is for gastroparesis?  Holding for now due to potential interaction with the fluconazole.  Essential hypertension BP is reasonably well controlled.  Continue to watch for now.  History of paroxysmal atrial fibrillation Noted to be on metoprolol and diltiazem.  Continue.  Not noted to be on anticoagulation.  This as per patient preference based on previous notes.  Plavix is being continued.   History of obstructive sleep apnea CPAP  Dyslipidemia Continue statin.  Status post kidney transplant Stable.  Creatinine is normal.  Continue home medications.  She is on chronic steroids.  She is on azathioprine.  History of depression Continue home medications.  History of coronary artery disease status post CABG Cardiac status appears to be stable.  EKG done at the time of admission did show nonspecific T wave changes seen previously as well.  Continue home medications.  Telemetry did show some bundle branch block.  Patient has a history of same.  Monitor for now.   She is asymptomatic.  Normocytic anemia Hemoglobin is stable.  Likely anemia of chronic disease.  DVT Prophylaxis: SCDs    Code Status: Full code Family Communication: Discussed with patient Disposition Plan:  Await pathology report.  Continue IV antibiotics for now.  Mobilize as tolerated.    LOS: 2 days   [facility] Pager [phone] 01/30/2018, 12:09 PM  If 7PM-7AM, please contact night-coverage at [email or web address], password [facility]

## 2018-01-30 NOTE — Progress Notes (Signed)
Pharmacy Antibiotic Note  Katherine [name] is a 62 y.o. female admitted on 01/28/2018 with thumb pain and possible sepsis due to cellulitis. Now with concern for pneumonia. Pharmacy has been consulted for vancomycin and levaquin dosing.  Plan: -Start Levaquin 750 mg q24h -Continue Vancomycin 1,250 mg IV q12h  -Monitor clinical progress, LOT, renal function  Weight: 232 lb (105.2 kg)  Temp (24hrs), Avg:98.2 F (36.8 C), Min:97.9 F (36.6 C), Max:98.5 F (36.9 C)  Recent Labs  Lab 01/28/18 1237 01/28/18 1255 01/28/18 1804 01/29/18 0508 01/29/18 1058  WBC 3.5*  --   --   --  2.8*  CREATININE 0.93  --   --  0.87  --   LATICACIDVEN  --  2.71* 1.57  --   --     Estimated Creatinine Clearance: 76.9 mL/min (by C-G formula based on SCr of 0.87 mg/dL).    Allergies  Allergen Reactions  . Tetracycline Hives  . Hydromorphone Hcl Nausea And Vomiting  . Niacin Other (See Comments)    Mouth blisters  . Niaspan [Niacin Er] Other (See Comments)    Mouth blisters  . Sulfa Antibiotics Nausea Only and Other (See Comments)    "Tears up stomach"  . Sulfonamide Derivatives Other (See Comments)    Reaction: per patient "tears her stomach up"  . Codeine Nausea And Vomiting  . Erythromycin Nausea And Vomiting  . Hydromorphone Hcl Nausea And Vomiting  . Morphine And Related Nausea And Vomiting  . Nalbuphine Nausea And Vomiting  . Sulfasalazine Nausea Only and Other (See Comments)    per patient "tears her stomach up", "Tears up stomach"  . Tape Rash and Other (See Comments)    No "plastic" tape," please    Antimicrobials this admission: Doxy PTA  Zosyn 6/14 >> 6/15 Vanc 6/14 >> Fluconazole 6/14 >> Levaquin 6/16 >>  Microbiology results: 6/11 Outpatient Cx >> GS with rare yeast, moderate GPC 6/14 Blood Cx >> NGTD MRSA PCR Positive   Thank you for allowing pharmacy to be a part of this patient's care.   [facility] [employer] Rudd, PharmD, [place] PGY1 Pharmacy Resident

## 2018-01-30 NOTE — Progress Notes (Signed)
Central telemetry called RN and stated that pt has moments of BBB. Pt was ambulating at times this noted and pt asymptomatic. All BP/HR medications were given. MD notified of situation. Will continue to monitor patient.

## 2018-01-31 LAB — BASIC METABOLIC PANEL
Anion gap: 10 (ref 5–15)
BUN: 11 mg/dL (ref 6–20)
CO2: 23 mmol/L (ref 22–32)
CREATININE: 0.97 mg/dL (ref 0.44–1.00)
Calcium: 8.5 mg/dL — ABNORMAL LOW (ref 8.9–10.3)
Chloride: 108 mmol/L (ref 101–111)
GFR calc Af Amer: >60 mL/min (ref >60)
GFR calc non Af Amer: >60 mL/min (ref >60)
Glucose, Bld: 139 mg/dL — ABNORMAL HIGH (ref 65–99)
Potassium: 3.5 mmol/L (ref 3.5–5.1)
SODIUM: 141 mmol/L (ref 135–145)

## 2018-01-31 LAB — CBC
HCT: 26.1 % — ABNORMAL LOW (ref 36.0–46.0)
HEMOGLOBIN: 8.5 g/dL — AB (ref 12.0–15.0)
MCH: 31.1 pg (ref 26.0–34.0)
MCHC: 32.6 g/dL (ref 30.0–36.0)
MCV: 95.6 fL (ref 78.0–100.0)
PLATELETS: 196 10*3/uL (ref 150–400)
RBC: 2.73 MIL/uL — ABNORMAL LOW (ref 3.87–5.11)
RDW: 14 % (ref 11.5–15.5)
WBC: 3.9 10*3/uL — ABNORMAL LOW (ref 4.0–10.5)

## 2018-01-31 LAB — GLUCOSE, CAPILLARY
GLUCOSE-CAPILLARY: 187 mg/dL — AB (ref 65–99)
Glucose-Capillary: 105 mg/dL — ABNORMAL HIGH (ref 65–99)
Glucose-Capillary: 210 mg/dL — ABNORMAL HIGH (ref 65–99)

## 2018-01-31 NOTE — Progress Notes (Signed)
Pt is s/p excision of lesion thumb - specimen sent to pathology. From my point of view, pt may be d/c'd home when medically stable.  Once pathology is back, pt will need additional wound treatment.  Cont current dressing for now.  Pt may follow up in my office this week.

## 2018-01-31 NOTE — Progress Notes (Signed)
[facility] PROGRESS NOTE  Katherine [name] HWE:[id number] DOB: [birth year] DOA: 01/28/2018  PCP: [name], MD  Brief History/Interval Summary: 62 year old Caucasian female with a past medical history of coronary artery disease status post CABG, diabetes mellitus type 2, morbid obesity status post lap band surgery, history of kidney transplant presented with right thumb pain.  She has had a scab-like lesion there since a month.  This has been progressively getting worse.  About a week ago she noticed that the skin was red.  She went to urgent care.  They drew some cultures from that area and applied some kind of lotion.  She was prescribed doxycycline.  However her symptoms have not improved.  She experience worsening pain.  There is also some redness and streaking on her wrist area.  So she decided to come back to the hospital.  There was also some complains about shortness of breath and chest discomfort.  Reason for Visit: Right thumb lesion, cellulitis  Consultants: Dr. Lenon Curt with hand surgery  Procedures: Excision of the lesion from right thumb  Antibiotics: Patient was initially placed on vancomycin and Zosyn and Diflucan. Zosyn was discontinued Initially Levaquin was ordered for possible bronchitis versus pneumonia.  Switched over to Cefdinir Vancomycin discontinued on 6/17  Subjective/Interval History: Patient continues to have a cough with yellowish expectoration.  Denies any blood in the sputum.  Continues to have some pleuritic chest pain.  Pain in the right thumb has improved.    ROS: Denies any nausea or vomiting  Objective:  Vital Signs  Vitals:   01/30/18 1335 01/30/18 1941 01/31/18 0609 01/31/18 0639  BP: (!) 124/59 (!) 155/73 (!) 181/89 (!) 124/49  Pulse: 69 83 88 75  Resp: 20 20    Temp:  97.6 F (36.4 C) 98.1 F (36.7 C)   TempSrc:  Oral Oral   SpO2: 99% 97% 100%   Weight:        Intake/Output Summary (Last 24 hours) at 01/31/2018 0742 Last  data filed at 01/30/2018 1517 Gross per 24 hour  Intake 1325.68 ml  Output -  Net 1325.68 ml   Filed Weights   01/28/18 1211  Weight: 105.2 kg (232 lb)    General appearance: Awake alert.  In no distress Resp: Clear to auscultation bilaterally.  No wheezing rales or rhonchi Cardio: S1-S2 is normal regular.  No S3-S4.  No rubs murmurs or bruit GI: Soft.  Nontender nondistended.  Bowel sounds are present.  No masses organomegaly Extremities: Right arm is covered in dressing.  Good radial pulses. Neurologic: No focal neurological deficits.  Lab Results:  Data Reviewed: I have personally reviewed following labs and imaging studies  CBC: Recent Labs  Lab 01/28/18 1237 01/29/18 1058 01/30/18 1000 01/31/18 0254  WBC 3.5* 2.8* 2.9* 3.9*  NEUTROABS 2.0  --   --   --   HGB 10.1* 9.2* 9.0* 8.5*  HCT 31.2* 28.3* 27.6* 26.1*  MCV 96.6 95.9 96.5 95.6  PLT 187 184 193 893    Basic Metabolic Panel: Recent Labs  Lab 01/28/18 1237 01/29/18 0508 01/31/18 0254  NA 138 139 141  K 3.9 4.2 3.5  CL 107 107 108  CO2 20* 22 23  GLUCOSE 172* 84 139*  BUN 12 12 11   CREATININE 0.93 0.87 0.97  CALCIUM 8.6* 8.5* 8.5*    GFR: Estimated Creatinine Clearance: 68.9 mL/min (by C-G formula based on SCr of 0.97 mg/dL).  HbA1C: Recent Labs    01/28/18 1237  HGBA1C  7.1*    CBG: Recent Labs  Lab 01/30/18 0652 01/30/18 1108 01/30/18 1602 01/30/18 2120 01/31/18 0634  GLUCAP 185* 275* 223* 149* 105*     Recent Results (from the past 240 hour(s))  Wound or Superficial Culture     Status: Abnormal   Collection Time: 01/25/18  2:42 PM  Result Value Ref Range Status   Specimen Description ABSCESS THUMB  Final   Special Requests Immunocompromised  Final   Gram Stain   Final    RARE WBC PRESENT, PREDOMINANTLY MONONUCLEAR MODERATE GRAM POSITIVE COCCI RARE YEAST Performed at [facility], [place] [address], [place], [place] [address]    Culture MULTIPLE ORGANISMS PRESENT, NONE  PREDOMINANT (A)  Final   Report Status 01/28/2018 FINAL  Final  Blood Culture (routine x 2)     Status: None (Preliminary result)   Collection Time: 01/28/18  2:45 PM  Result Value Ref Range Status   Specimen Description BLOOD LEFT HAND  Final   Special Requests   Final    BOTTLES DRAWN AEROBIC AND ANAEROBIC Blood Culture adequate volume   Culture   Final    NO GROWTH 2 DAYS Performed at [facility], [place] [address] [place], [place] [address]    Report Status PENDING  Incomplete  Blood Culture (routine x 2)     Status: None (Preliminary result)   Collection Time: 01/28/18  3:00 PM  Result Value Ref Range Status   Specimen Description BLOOD RIGHT ANTECUBITAL  Final   Special Requests   Final    BOTTLES DRAWN AEROBIC AND ANAEROBIC Blood Culture results may not be optimal due to an inadequate volume of blood received in culture bottles   Culture   Final    NO GROWTH 2 DAYS Performed at [facility], [place] [address], [place], [place] [address]    Report Status PENDING  Incomplete  Surgical pcr screen     Status: Abnormal   Collection Time: 01/29/18 11:56 AM  Result Value Ref Range Status   MRSA, PCR NEGATIVE NEGATIVE Final   Staphylococcus aureus POSITIVE (A) NEGATIVE Final    Comment: (NOTE) The Xpert SA Assay (FDA approved for NASAL specimens in patients 14 years of age and older), is one component of a comprehensive surveillance program. It is not intended to diagnose infection nor to guide or monitor treatment. Performed at [facility], [place] [address] [place], [place] [address]       Radiology Studies: No results found.   Medications:  Scheduled: . azaTHIOprine  125 mg Oral QPM  . calcitRIOL  0.25 mcg Oral Q3 days  . carbamazepine  200 mg Oral BID  . cefdinir  300 mg Oral Q12H  . clopidogrel  75 mg Oral Daily  . diltiazem  120 mg Oral Daily  . DULoxetine  60 mg Oral q morning - 10a  . famotidine  20 mg Oral Daily  . ferrous sulfate  325 mg  Oral BID  . fluconazole  200 mg Oral q1800  . fluticasone  2 spray Each Nare BID  . guaiFENesin  600 mg Oral BID  . insulin aspart  0-15 Units Subcutaneous TID WC  . insulin glargine  12 Units Subcutaneous Daily  . LORazepam  1 mg Oral q morning - 10a  . metoprolol tartrate  25 mg Oral Daily  . mometasone-formoterol  2 puff Inhalation BID  . montelukast  10 mg Oral QHS  . pantoprazole  40 mg Oral  Daily  . predniSONE  5 mg Oral Q breakfast  . rosuvastatin  20 mg Oral Daily  . sertraline  100 mg Oral q morning - 10a   Continuous: . sodium chloride    . vancomycin Stopped (01/30/18 2331)   WUJ:[id number], albuterol, cyclobenzaprine, guaiFENesin, loperamide, nitroGLYCERIN, ondansetron **OR** ondansetron (ZOFRAN) IV, oxyCODONE, promethazine    Assessment/Plan:  Right thumb lesion with superimposed cellulitis There was concern for mild sepsis due to elevated lactic acid levels.  Patient does not have sepsis physiology.  Patient's right thumb lesion has been present for about a month.  It has increased in size.  Etiology not clear.  Patient seen by hand surgery.  Underwent surgical excision on 6/15.  Specimen sent to pathology.  She was initially placed on vancomycin and Zosyn.  Zosyn was discontinued.  Fluconazole was also initiated.  She was placed on cefdinir due to her respiratory symptoms.  MRSA PCR is negative.  Okay to stop vancomycin now.      Cough/chest congestion/chest discomfort Symptoms are somewhat pleuritic.  Lungs are clear to auscultation bilaterally.  Patient likely has an element of acute bronchitis.  Continue oral antibiotics.  Mucinex.  Robitussin.  Chest x-ray did not show any acute infiltrates.  Diabetes mellitus type 2 Continue Lantus and SSI.  HbA1c 7.1.  Noted to be on erythromycin.  Wonder if this is for gastroparesis?  Holding for now due to potential interaction with the fluconazole.  Essential hypertension BP is reasonably well controlled.  Continue to  watch for now.  History of paroxysmal atrial fibrillation Noted to be on metoprolol and diltiazem.  Continue.  Not noted to be on anticoagulation.  This as per patient preference based on previous notes.  Plavix is being continued.   History of obstructive sleep apnea CPAP  Dyslipidemia Continue statin.  Status post kidney transplant Stable.  Creatinine is normal.  Continue home medications.  She is on chronic steroids.  She is on azathioprine.  History of depression Continue home medications.  History of coronary artery disease status post CABG Cardiac status appears to be stable.  EKG done at the time of admission did show nonspecific T wave changes seen previously as well.  Continue home medications.  Telemetry did show some bundle branch block.  Patient has a history of same.  Monitor for now.  She is asymptomatic.  Normocytic anemia Hemoglobin is stable.  Likely anemia of chronic disease.  DVT Prophylaxis: SCDs    Code Status: Full code Family Communication: Discussed with patient Disposition Plan: Ambulate patient.  Stop vancomycin today.  If respiratory symptoms remain stable she could be discharged tomorrow.    LOS: 3 days   [facility] Pager [phone] 01/31/2018, 7:42 AM  If 7PM-7AM, please contact night-coverage at [email or web address], password [facility]

## 2018-02-01 ENCOUNTER — Encounter (HOSPITAL_COMMUNITY): Payer: Self-pay | Admitting: General Practice

## 2018-02-01 ENCOUNTER — Other Ambulatory Visit: Payer: Self-pay

## 2018-02-01 LAB — GLUCOSE, CAPILLARY
GLUCOSE-CAPILLARY: 185 mg/dL — AB (ref 65–99)
GLUCOSE-CAPILLARY: 300 mg/dL — AB (ref 65–99)
Glucose-Capillary: 103 mg/dL — ABNORMAL HIGH (ref 65–99)
Glucose-Capillary: 142 mg/dL — ABNORMAL HIGH (ref 65–99)

## 2018-02-01 MED ORDER — ERYTHROMYCIN BASE 250 MG PO TABS
500.0000 mg | ORAL_TABLET | Freq: Every day | ORAL | Status: DC
  Filled 2018-02-01: qty 2

## 2018-02-01 NOTE — Progress Notes (Addendum)
[facility] PROGRESS NOTE  Katherine [name] BPZ:[id number] DOB: 10/29/1955 DOA: 01/28/2018  PCP: [name], MD  Brief History/Interval Summary: 62 year old Caucasian female with a past medical history of coronary artery disease status post CABG, diabetes mellitus type 2, morbid obesity status post lap band surgery, history of kidney transplant presented with right thumb pain.  She has had a scab-like lesion there since a month.  This has been progressively getting worse.  About a week ago she noticed that the skin was red.  She went to urgent care.  They drew some cultures from that area and applied some kind of lotion.  She was prescribed doxycycline.  However her symptoms have not improved.  She experience worsening pain.  There is also some redness and streaking on her wrist area.  So she decided to come back to the hospital.  There was also some complains about shortness of breath and chest discomfort.  Patient was seen by hand surgery.  Patient underwent excision of the lesion.  Reason for Visit: Right thumb lesion, cellulitis  Consultants: Dr. Lenon Curt with hand surgery  Procedures: Excision of the lesion from right thumb  Antibiotics: Patient was initially placed on vancomycin and Zosyn and Diflucan. Zosyn was discontinued Initially Levaquin was ordered for possible bronchitis versus pneumonia.  Switched over to Cefdinir Vancomycin discontinued on 6/17  Subjective/Interval History: Patient complains of feeling fatigued this morning.  No shortness of breath.  Cough persists but has improved.  Denies any chest pain.     ROS: Denies any nausea vomiting  Objective:  Vital Signs  Vitals:   01/31/18 0639 01/31/18 1510 01/31/18 1949 02/01/18 0525  BP: (!) 124/49 (!) 108/42 140/63 (!) 120/54  Pulse: 75 66 68 71  Resp:  16 17 18   Temp:  98.3 F (36.8 C) 97.9 F (36.6 C) 99 F (37.2 C)  TempSrc:  Oral Oral Oral  SpO2:  100% 100% 98%  Weight:        Intake/Output  Summary (Last 24 hours) at 02/01/2018 0919 Last data filed at 02/01/2018 0300 Gross per 24 hour  Intake 1320 ml  Output -  Net 1320 ml   Filed Weights   01/28/18 1211  Weight: 105.2 kg (232 lb)    General appearance: Awake alert.  In no distress Resp: Clear to auscultation bilaterally.  No wheezing rales or rhonchi Cardio: S1-S2 is normal regular.  No S3-S4.  No murmurs or bruit GI: Abdomen is soft.  Nontender nondistended.  Bowel sounds are present.  No masses organomegaly Extremities: Right hand covered in dressing Neurologic: No focal neurological deficits.  Lab Results:  Data Reviewed: I have personally reviewed following labs and imaging studies  CBC: Recent Labs  Lab 01/28/18 1237 01/29/18 1058 01/30/18 1000 01/31/18 0254  WBC 3.5* 2.8* 2.9* 3.9*  NEUTROABS 2.0  --   --   --   HGB 10.1* 9.2* 9.0* 8.5*  HCT 31.2* 28.3* 27.6* 26.1*  MCV 96.6 95.9 96.5 95.6  PLT 187 184 193 242    Basic Metabolic Panel: Recent Labs  Lab 01/28/18 1237 01/29/18 0508 01/31/18 0254  NA 138 139 141  K 3.9 4.2 3.5  CL 107 107 108  CO2 20* 22 23  GLUCOSE 172* 84 139*  BUN 12 12 11   CREATININE 0.93 0.87 0.97  CALCIUM 8.6* 8.5* 8.5*    GFR: Estimated Creatinine Clearance: 68.9 mL/min (by C-G formula based on SCr of 0.97 mg/dL).  HbA1C: No results for input(s): HGBA1C in  the last 72 hours.  CBG: Recent Labs  Lab 01/30/18 2120 01/31/18 0634 01/31/18 1200 01/31/18 1602 02/01/18 0642  GLUCAP 149* 105* 210* 187* 103*     Recent Results (from the past 240 hour(s))  Wound or Superficial Culture     Status: Abnormal   Collection Time: 01/25/18  2:42 PM  Result Value Ref Range Status   Specimen Description ABSCESS THUMB  Final   Special Requests Immunocompromised  Final   Gram Stain   Final    RARE WBC PRESENT, PREDOMINANTLY MONONUCLEAR MODERATE GRAM POSITIVE COCCI RARE YEAST Performed at [facility], [place] [address] [place], [place] [address]    Culture  MULTIPLE ORGANISMS PRESENT, NONE PREDOMINANT (A)  Final   Report Status 01/28/2018 FINAL  Final  Blood Culture (routine x 2)     Status: None (Preliminary result)   Collection Time: 01/28/18  2:45 PM  Result Value Ref Range Status   Specimen Description BLOOD LEFT HAND  Final   Special Requests   Final    BOTTLES DRAWN AEROBIC AND ANAEROBIC Blood Culture adequate volume   Culture   Final    NO GROWTH 3 DAYS Performed at [facility], [place] [address] [place], [place] [address]    Report Status PENDING  Incomplete  Blood Culture (routine x 2)     Status: None (Preliminary result)   Collection Time: 01/28/18  3:00 PM  Result Value Ref Range Status   Specimen Description BLOOD RIGHT ANTECUBITAL  Final   Special Requests   Final    BOTTLES DRAWN AEROBIC AND ANAEROBIC Blood Culture results may not be optimal due to an inadequate volume of blood received in culture bottles   Culture   Final    NO GROWTH 3 DAYS Performed at [facility], [place] [address] [place], [place] [address]    Report Status PENDING  Incomplete  Surgical pcr screen     Status: Abnormal   Collection Time: 01/29/18 11:56 AM  Result Value Ref Range Status   MRSA, PCR NEGATIVE NEGATIVE Final   Staphylococcus aureus POSITIVE (A) NEGATIVE Final    Comment: (NOTE) The Xpert SA Assay (FDA approved for NASAL specimens in patients 35 years of age and older), is one component of a comprehensive surveillance program. It is not intended to diagnose infection nor to guide or monitor treatment. Performed at [facility], [place] [address], [place], [place] [address]       Radiology Studies: No results found.   Medications:  Scheduled: . azaTHIOprine  125 mg Oral QPM  . calcitRIOL  0.25 mcg Oral Q3 days  . carbamazepine  200 mg Oral BID  . cefdinir  300 mg Oral Q12H  . clopidogrel  75 mg Oral Daily  . diltiazem  120 mg Oral Daily  . DULoxetine  60 mg Oral q morning - 10a  . famotidine  20 mg Oral  Daily  . ferrous sulfate  325 mg Oral BID  . fluconazole  200 mg Oral q1800  . fluticasone  2 spray Each Nare BID  . guaiFENesin  600 mg Oral BID  . insulin aspart  0-15 Units Subcutaneous TID WC  . insulin glargine  12 Units Subcutaneous Daily  . LORazepam  1 mg Oral q morning - 10a  . metoprolol tartrate  25 mg Oral Daily  . mometasone-formoterol  2 puff Inhalation BID  . montelukast  10 mg Oral QHS  . pantoprazole  40 mg  Oral Daily  . predniSONE  5 mg Oral Q breakfast  . rosuvastatin  20 mg Oral Daily  . sertraline  100 mg Oral q morning - 10a   Continuous: . sodium chloride     OPF:[id number], albuterol, cyclobenzaprine, guaiFENesin, loperamide, nitroGLYCERIN, ondansetron **OR** ondansetron (ZOFRAN) IV, oxyCODONE, promethazine    Assessment/Plan:  Right thumb lesion with superimposed cellulitis There was concern for mild sepsis due to elevated lactic acid levels.  Patient does not have sepsis physiology.  Patient's right thumb lesion has been present for about a month.  It has increased in size.  Etiology not clear.  Patient seen by hand surgery.  Underwent surgical excision on 6/15.  Specimen sent to pathology.  She was initially placed on vancomycin and Zosyn.  Zosyn was discontinued.  Fluconazole was also initiated.  She was placed on cefdinir due to her respiratory symptoms.  MRSA PCR is negative.  Vancomycin was discontinued.  We will also discontinue fluconazole.  Path is still pending.  Dr. Lenon Curt to follow the patient in his office later this week.  Cough/chest congestion/chest discomfort Symptoms were treated.  Lungs have been clear to auscultation.  Chest x-ray did not show any infiltrates.  She likely has acute bronchitis.  Improving slowly.  Check room air saturations.  Ambulate.  Patient tells me that she is moving to a different apartment.  Her husband has packed all the material.  The house is very dusty she is worried that her respiratory status will get worse if  she goes home.  Diabetes mellitus type 2 Continue Lantus and SSI.  HbA1c 7.1.  Noted to be on erythromycin.  Wonder if this is for gastroparesis?  Holding for now due to potential interaction with the fluconazole.  Could resume tomorrow now that fluconazole will be discontinued.  Essential hypertension BP is reasonably well controlled.  Continue to watch for now.  History of paroxysmal atrial fibrillation Noted to be on metoprolol and diltiazem.  Continue.  Not noted to be on anticoagulation.  This as per patient preference based on previous notes.  Plavix is being continued.   History of obstructive sleep apnea CPAP  Dyslipidemia Continue statin.  Status post kidney transplant Stable.  Creatinine is normal.  Continue home medications.  She is on chronic steroids.  She is on azathioprine.  History of depression Continue home medications.  History of coronary artery disease status post CABG Cardiac status appears to be stable.  EKG done at the time of admission did show nonspecific T wave changes seen previously as well.  Continue home medications.  Telemetry did show some bundle branch block.  Patient has a history of same.  Monitor for now.  She is asymptomatic.  Normocytic anemia Hemoglobin is stable.  Likely anemia of chronic disease.  DVT Prophylaxis: SCDs    Code Status: Full code Family Communication: Discussed with patient Disposition Plan: Ambulate patient.  Wean off oxygen.  Possible discharge tomorrow.      LOS: 4 days   [facility] Pager [phone] 02/01/2018, 9:18 AM  If 7PM-7AM, please contact night-coverage at [email or web address], password [facility]

## 2018-02-02 ENCOUNTER — Other Ambulatory Visit: Payer: Self-pay | Admitting: Internal Medicine

## 2018-02-02 DIAGNOSIS — C44622 Squamous cell carcinoma of skin of right upper limb, including shoulder: Secondary | ICD-10-CM | POA: Insufficient documentation

## 2018-02-02 LAB — CULTURE, BLOOD (ROUTINE X 2)
CULTURE: NO GROWTH
CULTURE: NO GROWTH
Special Requests: ADEQUATE

## 2018-02-02 LAB — GLUCOSE, CAPILLARY
Glucose-Capillary: 135 mg/dL — ABNORMAL HIGH (ref 65–99)
Glucose-Capillary: 194 mg/dL — ABNORMAL HIGH (ref 65–99)

## 2018-02-02 MED ORDER — CEFDINIR 300 MG PO CAPS: 300.0000 mg | ORAL_CAPSULE | Freq: Two times a day (BID) | ORAL | 0 refills | Status: AC

## 2018-02-02 MED ORDER — GUAIFENESIN ER 600 MG PO TB12: 600.0000 mg | ORAL_TABLET | Freq: Two times a day (BID) | ORAL | 0 refills | Status: DC
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
## 2018-02-02 NOTE — Discharge Summary (Signed)
[facility]  Physician Discharge Summary   Patient ID: Katherine [name] MRN: [id number] DOB/AGE: [birth year] 62 y.o.  Admit date: 01/28/2018 Discharge date: 02/02/2018  PCP: [name], MD  DISCHARGE DIAGNOSES:  Right thumb lesion  RECOMMENDATIONS FOR OUTPATIENT FOLLOW UP: 1. Patient to follow-up with Dr. Brennan Bailey office before the end of the week  DISCHARGE CONDITION: fair  Diet recommendation: As before  [facility] Weights   01/28/18 1211  Weight: 105.2 kg (232 lb)    INITIAL HISTORY: 62 year old Caucasian female with a past medical history of coronary artery disease status post CABG, diabetes mellitus type 2, morbid obesity status post lap band surgery, history of kidney transplant presented with right thumb pain.  She has had a scab-like lesion there since a month.  This has been progressively getting worse.  About a week ago she noticed that the skin was red.  She went to urgent care.  They drew some cultures from that area and applied some kind of lotion.  She was prescribed doxycycline.  However her symptoms have not improved.  She experience worsening pain.  There is also some redness and streaking on her wrist area.  So she decided to come back to the hospital.  There was also some complains about shortness of breath and chest discomfort.  Patient was seen by hand surgery.  Patient underwent excision of the lesion.  Consultants: Dr. Lenon Curt with hand surgery  Procedures: Excision of the lesion from right thumb  Antibiotics: Patient was initially placed on vancomycin and Zosyn and Diflucan. Zosyn was discontinued Initially Levaquin was ordered for possible bronchitis versus pneumonia.  Switched over to Cefdinir Vancomycin discontinued on 6/17   HOSPITAL COURSE:   Right thumb lesion with superimposed cellulitis There was concern for mild sepsis due to elevated lactic acid levels.  Patient does not have sepsis physiology.  Patient's right thumb lesion has been  present for about a month.  It has increased in size.  Etiology not clear.  Patient seen by hand surgery.  Underwent surgical excision on 6/15.  Specimen sent to pathology.  She was initially placed on vancomycin and Zosyn and fluconazole.  Zosyn was discontinued. She was placed on cefdinir due to her respiratory symptoms.  MRSA PCR is negative.  Vancomycin was discontinued.    The fluconazole was also discontinued.  Pathology report is pending.  Dr. Lenon Curt to follow the patient in his office later this week.  Cough/chest congestion/chest discomfort Lungs have been clear to auscultation.  Chest x-ray did not show any infiltrates.  She likely has acute bronchitis.  Placed on oral antibiotics and Mucinex. Improving slowly.  Saturating normal on room air.  Diabetes mellitus type 2 Continue Lantus and SSI.  HbA1c 7.1.  Noted to be on erythromycin.  Wonder if this is for gastroparesis?    Patient does not know if she is on this medication.  She was asked to review her medications at home.  She should ask her PCP as to the reason if she is indeed on this medication.    Essential hypertension BP is reasonably well controlled.   History of paroxysmal atrial fibrillation Noted to be on metoprolol and diltiazem.  Continue.  Not noted to be on anticoagulation.  This as per patient preference based on previous notes.  Plavix is being continued.   History of obstructive sleep apnea CPAP  Dyslipidemia Continue statin.  Status post kidney transplant Stable.  Creatinine is normal.  Continue home medications.  She is on chronic  steroids.  She is on azathioprine.  History of depression Continue home medications.  History of coronary artery disease status post CABG Cardiac status appears to be stable.  EKG done at the time of admission did show nonspecific T wave changes seen previously as well.  Continue home medications.  Telemetry did show some bundle branch block.  Patient has a history of same.     Normocytic anemia Hemoglobin is stable.  Likely anemia of chronic disease.  Overall stable.  Okay for discharge home today.     PERTINENT LABS:  The results of significant diagnostics from this hospitalization (including imaging, microbiology, ancillary and laboratory) are listed below for reference.    Microbiology: Recent Results (from the past 240 hour(s))  Wound or Superficial Culture     Status: Abnormal   Collection Time: 01/25/18  2:42 PM  Result Value Ref Range Status   Specimen Description ABSCESS THUMB  Final   Special Requests Immunocompromised  Final   Gram Stain   Final    RARE WBC PRESENT, PREDOMINANTLY MONONUCLEAR MODERATE GRAM POSITIVE COCCI RARE YEAST Performed at [facility], [address], [place], [place] [address]    Culture MULTIPLE ORGANISMS PRESENT, NONE PREDOMINANT (A)  Final   Report Status 01/28/2018 FINAL  Final  Blood Culture (routine x 2)     Status: None   Collection Time: 01/28/18  2:45 PM  Result Value Ref Range Status   Specimen Description BLOOD LEFT HAND  Final   Special Requests   Final    BOTTLES DRAWN AEROBIC AND ANAEROBIC Blood Culture adequate volume   Culture NO GROWTH 5 DAYS  Final   Report Status 02/02/2018 FINAL  Final  Blood Culture (routine x 2)     Status: None   Collection Time: 01/28/18  3:00 PM  Result Value Ref Range Status   Specimen Description BLOOD RIGHT ANTECUBITAL  Final   Special Requests   Final    BOTTLES DRAWN AEROBIC AND ANAEROBIC Blood Culture results may not be optimal due to an inadequate volume of blood received in culture bottles   Culture NO GROWTH 5 DAYS  Final   Report Status 02/02/2018 FINAL  Final  Surgical pcr screen     Status: Abnormal   Collection Time: 01/29/18 11:56 AM  Result Value Ref Range Status   MRSA, PCR NEGATIVE NEGATIVE Final   Staphylococcus aureus POSITIVE (A) NEGATIVE Final    Comment: (NOTE) The Xpert SA Assay (FDA approved for NASAL specimens in patients  38 years of age and older), is one component of a comprehensive surveillance program. It is not intended to diagnose infection nor to guide or monitor treatment. Performed at [facility], [place] [address], [place], [place] [address]      Labs: Basic Metabolic Panel: Recent Labs  Lab 01/28/18 1237 01/29/18 0508 01/31/18 0254  NA 138 139 141  K 3.9 4.2 3.5  CL 107 107 108  CO2 20* 22 23  GLUCOSE 172* 84 139*  BUN 12 12 11   CREATININE 0.93 0.87 0.97  CALCIUM 8.6* 8.5* 8.5*   CBC: Recent Labs  Lab 01/28/18 1237 01/29/18 1058 01/30/18 1000 01/31/18 0254  WBC 3.5* 2.8* 2.9* 3.9*  NEUTROABS 2.0  --   --   --   HGB 10.1* 9.2* 9.0* 8.5*  HCT 31.2* 28.3* 27.6* 26.1*  MCV 96.6 95.9 96.5 95.6  PLT 187 184 193 196    CBG: Recent Labs  Lab 02/01/18 1142 02/01/18 1650 02/01/18  2247 02/02/18 0557 02/02/18 1230  GLUCAP 185* 300* 142* 135* 194*     IMAGING STUDIES Dg Chest 2 View  Result Date: 01/28/2018 CLINICAL DATA:  Chest pain EXAM: CHEST - 2 VIEW COMPARISON:  Very 02/03/2018 FINDINGS: No edema or consolidation. Heart is upper normal in size with pulmonary vascularity normal. Patient is status post coronary artery bypass grafting. No adenopathy. No bone lesions. IMPRESSION: Status post coronary artery bypass grafting. No edema or consolidation. Heart upper normal in size. Electronically Signed   By: Lowella Grip III M.D.   On: 01/28/2018 13:20   Dg Hand Complete Right  Result Date: 01/28/2018 CLINICAL DATA:  Thumb wound. EXAM: RIGHT HAND - COMPLETE 3+ VIEW COMPARISON:  None. FINDINGS: Round, partially calcified 2.2 cm lesion along the ulnar aspect of the first proximal phalanx and IP joint. This does not clearly involve the underlying bone. Moderate osteoarthritis of the first [facility] joint with mild subluxation. Remaining joint spaces are preserved. Diffuse osteopenia. IMPRESSION: 1. Round, partially calcified 2.2 cm lesion along the ulnar aspect of the first IP  joint without definite involvement of the underlying bone. This is nonspecific, but could reflect chronic indolent granulomatous infection. Recommend non-emergent MRI with and without contrast to better characterize and evaluate for a discrete mass or underlying bony involvement. 2. 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 Electronically Signed   By: Titus Dubin M.D.   On: 01/28/2018 14:05    DISCHARGE EXAMINATION: Vitals:   02/01/18 1525 02/01/18 2025 02/01/18 2041 02/02/18 0449  BP: 127/75 138/60  (!) 128/52  Pulse: 85 70  77  Resp: 18 18  13   Temp: 98.8 F (37.1 C) 98.1 F (36.7 C)  98.7 F (37.1 C)  TempSrc: Oral Oral  Oral  SpO2: 97% 99% 99% 98%  Weight:       General appearance: alert, cooperative and appears stated age Resp: clear to auscultation bilaterally Cardio: regular rate and rhythm, S1, S2 normal, no murmur, click, rub or gallop GI: soft, non-tender; bowel sounds normal; no masses,  no organomegaly   DISPOSITION: Home  Discharge Instructions    Call MD for:  difficulty breathing, headache or visual disturbances   Complete by:  As directed    Call MD for:  extreme fatigue   Complete by:  As directed    Call MD for:  persistant dizziness or light-headedness   Complete by:  As directed    Call MD for:  persistant nausea and vomiting   Complete by:  As directed    Call MD for:  severe uncontrolled pain   Complete by:  As directed    Call MD for:  temperature >100.4   Complete by:  As directed    Discharge instructions   Complete by:  As directed    Please be sure to follow-up with Dr. Lenon Curt before the end of this week.  Call his office tomorrow.  Do not remove the dressing from your right hand till you have seen him in his office.  Take your medications as prescribed.  Follow-up with your primary care provider as well.    You were cared for by a hospitalist during your hospital stay. If you have any questions about your discharge medications or the care you  received while you were in the hospital after you are discharged, you can call the unit and asked to speak with the hospitalist on call if the hospitalist that took care of you is not available. Once you are discharged, your primary  care physician will handle any further medical issues. Please note that NO REFILLS for any discharge medications will be authorized once you are discharged, as it is imperative that you return to your primary care physician (or establish a relationship with a primary care physician if you do not have one) for your aftercare needs so that they can reassess your need for medications and monitor your lab values. If you do not have a primary care physician, you can call [phone] for a physician referral.   Increase activity slowly   Complete by:  As directed         Allergies as of 02/02/2018      Reactions   Tetracycline Hives   Hydromorphone Hcl Nausea And Vomiting   Niacin Other (See Comments)   Mouth blisters   Niaspan [niacin Er] Other (See Comments)   Mouth blisters   Sulfa Antibiotics Nausea Only, Other (See Comments)   "Tears up stomach"   Sulfonamide Derivatives Other (See Comments)   Reaction: per patient "tears her stomach up"   Codeine Nausea And Vomiting   Erythromycin Nausea And Vomiting   Hydromorphone Hcl Nausea And Vomiting   Morphine And Related Nausea And Vomiting   Nalbuphine Nausea And Vomiting   Sulfasalazine Nausea Only, Other (See Comments)   per patient "tears her stomach up", "Tears up stomach"   Tape Rash, Other (See Comments)   No "plastic" tape," please      Medication List    STOP taking these medications   doxycycline 100 MG capsule Commonly known as:  VIBRAMYCIN     TAKE these medications   acetaminophen 500 MG tablet Commonly known as:  TYLENOL Take 1 tablet (500 mg total) by mouth every 6 (six) hours as needed for moderate pain or headache. What changed:  how much to take   albuterol 108 (90 Base) MCG/ACT  inhaler Commonly known as:  VENTOLIN HFA INHALE 2 PUFFS INTO THE LUNGS EVERY 6 (SIX) HOURS AS NEEDED FOR WHEEZING OR SHORTNESS OF BREATH.   azaTHIOprine 50 MG tablet Commonly known as:  IMURAN Take 125 mg by mouth every evening.   BASAGLAR KWIKPEN 100 UNIT/ML Sopn Inject 12 Units into the skin daily.   budesonide-formoterol 160-4.5 MCG/ACT inhaler Commonly known as:  SYMBICORT Inhale 2 puffs into the lungs 2 (two) times daily.   calcitRIOL 0.25 MCG capsule Commonly known as:  ROCALTROL Take 0.25 mcg by mouth every 3 (three) days.   carbamazepine 200 MG tablet Commonly known as:  TEGRETOL TAKE 200 MG BY MOUTH TWICE A DAY   cefdinir 300 MG capsule Commonly known as:  OMNICEF Take 1 capsule (300 mg total) by mouth every 12 (twelve) hours for 5 days.   clopidogrel 75 MG tablet Commonly known as:  PLAVIX Take 1 tablet (75 mg total) by mouth daily.   clotrimazole 10 MG troche Commonly known as:  MYCELEX Take 1 tablet (10 mg total) by mouth 5 (five) times daily. What changed:    when to take this  reasons to take this   cyclobenzaprine 10 MG tablet Commonly known as:  FLEXERIL TAKE 1 TABLET BY MOUTH 3 TIMES DAILY AS NEEDED FOR MUSCLE SPASMS What changed:  See the new instructions.   diltiazem 120 MG 24 hr capsule Commonly known as:  CARDIZEM CD TAKE 1 CAPSULE (120 MG TOTAL) BY MOUTH DAILY. NEED OV.   DULoxetine 30 MG capsule Commonly known as:  CYMBALTA TAKE 2 CAPSULES (60 MG TOTAL) BY MOUTH EVERY MORNING.  erythromycin base 500 MG tablet Commonly known as:  E-MYCIN Take 500 mg by mouth daily.   famotidine 20 MG tablet Commonly known as:  PEPCID Take 20 mg by mouth daily.   ferrous sulfate 325 (65 FE) MG tablet Take 1 tablet (325 mg total) by mouth 3 (three) times daily with meals. What changed:  when to take this   fluticasone 50 MCG/ACT nasal spray Commonly known as:  FLONASE Place 2 sprays into both nostrils 2 (two) times daily.   glimepiride 4 MG  tablet Commonly known as:  AMARYL Take 4 mg by mouth every morning.   guaiFENesin 600 MG 12 hr tablet Commonly known as:  MUCINEX Take 1 tablet (600 mg total) by mouth 2 (two) times daily.   loperamide 2 MG tablet Commonly known as:  IMODIUM A-D Take 4 mg by mouth 2 (two) times daily.   LORazepam 1 MG tablet Commonly known as:  ATIVAN Take 1 mg by mouth every morning.   metFORMIN 500 MG tablet Commonly known as:  GLUCOPHAGE Take 500 mg by mouth 2 (two) times daily.   metoprolol tartrate 25 MG tablet Commonly known as:  LOPRESSOR TAKE 1/2 TABLET BY MOUTH DAILY ( TAKE A WHOLE TABLET DAILY IF SBP>150) What changed:    how much to take  how to take this  when to take this  additional instructions   montelukast 10 MG tablet Commonly known as:  SINGULAIR TAKE 1 TABLET BY MOUTH EVERY DAY   nitroGLYCERIN 0.4 MG SL tablet Commonly known as:  NITROSTAT Place 1 tablet (0.4 mg total) under the tongue every 5 (five) minutes as needed for chest pain.   NOVOLOG FLEXPEN 100 UNIT/ML FlexPen Generic drug:  insulin aspart Inject 4 Units into the skin 2 (two) times daily as needed.   omeprazole 40 MG capsule Commonly known as:  PRILOSEC Take 40 mg by mouth every morning.   oxyCODONE 5 MG immediate release tablet Commonly known as:  Oxy IR/ROXICODONE Take 1 tablet (5 mg total) by mouth every 8 (eight) hours as needed for severe pain.   predniSONE 5 MG tablet Commonly known as:  DELTASONE Take 5 mg by mouth daily with breakfast.   promethazine 25 MG tablet Commonly known as:  PHENERGAN Take 25 mg by mouth at bedtime as needed for nausea or vomiting (or sleep).   rosuvastatin 20 MG tablet Commonly known as:  CRESTOR Take 1 tablet (20 mg total) by mouth daily.   sertraline 100 MG tablet Commonly known as:  ZOLOFT Take 100 mg by mouth every morning.        Follow-up Information    Dayna Barker, MD. Schedule an appointment as soon as possible for a visit in 2 day(s).    Specialty:  General Surgery Contact information: [address] [place] [address] [phone]           TOTAL DISCHARGE TIME: 35 minutes  Bonnielee Haff  [facility] Pager [phone]  02/02/2018, 1:27 PM

## 2018-02-03 ENCOUNTER — Emergency Department (HOSPITAL_COMMUNITY): Payer: Medicare Other

## 2018-02-03 ENCOUNTER — Other Ambulatory Visit: Payer: Self-pay

## 2018-02-03 ENCOUNTER — Encounter (HOSPITAL_COMMUNITY): Payer: Self-pay | Admitting: Emergency Medicine

## 2018-02-03 ENCOUNTER — Emergency Department (HOSPITAL_COMMUNITY)
Admission: EM | Admit: 2018-02-03 | Discharge: 2018-02-03 | Disposition: A | Discharge status: Home / Self Care | Payer: Medicare Other | Attending: Emergency Medicine | Admitting: Emergency Medicine

## 2018-02-03 DIAGNOSIS — J449 Chronic obstructive pulmonary disease, unspecified: Secondary | ICD-10-CM | POA: Insufficient documentation

## 2018-02-03 DIAGNOSIS — R112 Nausea with vomiting, unspecified: Secondary | ICD-10-CM | POA: Insufficient documentation

## 2018-02-03 DIAGNOSIS — M549 Dorsalgia, unspecified: Secondary | ICD-10-CM | POA: Diagnosis not present

## 2018-02-03 DIAGNOSIS — E876 Hypokalemia: Secondary | ICD-10-CM

## 2018-02-03 DIAGNOSIS — I10 Essential (primary) hypertension: Secondary | ICD-10-CM | POA: Diagnosis not present

## 2018-02-03 DIAGNOSIS — Z7984 Long term (current) use of oral hypoglycemic drugs: Secondary | ICD-10-CM | POA: Diagnosis not present

## 2018-02-03 DIAGNOSIS — Z79899 Other long term (current) drug therapy: Secondary | ICD-10-CM | POA: Insufficient documentation

## 2018-02-03 DIAGNOSIS — E119 Type 2 diabetes mellitus without complications: Secondary | ICD-10-CM | POA: Diagnosis not present

## 2018-02-03 DIAGNOSIS — R509 Fever, unspecified: Secondary | ICD-10-CM | POA: Diagnosis not present

## 2018-02-03 DIAGNOSIS — I2510 Atherosclerotic heart disease of native coronary artery without angina pectoris: Secondary | ICD-10-CM | POA: Diagnosis not present

## 2018-02-03 DIAGNOSIS — J189 Pneumonia, unspecified organism: Secondary | ICD-10-CM | POA: Diagnosis not present

## 2018-02-03 DIAGNOSIS — R531 Weakness: Secondary | ICD-10-CM | POA: Insufficient documentation

## 2018-02-03 DIAGNOSIS — Z87891 Personal history of nicotine dependence: Secondary | ICD-10-CM | POA: Insufficient documentation

## 2018-02-03 LAB — COMPREHENSIVE METABOLIC PANEL
ALK PHOS: 124 U/L (ref 38–126)
ALT: 17 U/L (ref 14–54)
ANION GAP: 11 (ref 5–15)
AST: 24 U/L (ref 15–41)
Albumin: 3 g/dL — ABNORMAL LOW (ref 3.5–5.0)
BUN: 9 mg/dL (ref 6–20)
CALCIUM: 8.9 mg/dL (ref 8.9–10.3)
CO2: 23 mmol/L (ref 22–32)
CREATININE: 0.81 mg/dL (ref 0.44–1.00)
Chloride: 105 mmol/L (ref 101–111)
GFR calc Af Amer: >60 mL/min (ref >60)
Glucose, Bld: 163 mg/dL — ABNORMAL HIGH (ref 65–99)
Potassium: 3.2 mmol/L — ABNORMAL LOW (ref 3.5–5.1)
Sodium: 139 mmol/L (ref 135–145)
TOTAL PROTEIN: 6.4 g/dL — AB (ref 6.5–8.1)
Total Bilirubin: 0.5 mg/dL (ref 0.3–1.2)

## 2018-02-03 LAB — CBC WITH DIFFERENTIAL/PLATELET
ABS IMMATURE GRANULOCYTES: 0.2 10*3/uL — AB (ref 0.0–0.1)
BASOS PCT: 1 %
Basophils Absolute: 0 10*3/uL (ref 0.0–0.1)
EOS PCT: 2 %
Eosinophils Absolute: 0.1 10*3/uL (ref 0.0–0.7)
HCT: 29.3 % — ABNORMAL LOW (ref 36.0–46.0)
HEMOGLOBIN: 9.6 g/dL — AB (ref 12.0–15.0)
Immature Granulocytes: 5 %
LYMPHS PCT: 14 %
Lymphs Abs: 0.6 10*3/uL — ABNORMAL LOW (ref 0.7–4.0)
MCH: 31.6 pg (ref 26.0–34.0)
MCHC: 32.8 g/dL (ref 30.0–36.0)
MCV: 96.4 fL (ref 78.0–100.0)
MONO ABS: 0.3 10*3/uL (ref 0.1–1.0)
MONOS PCT: 7 %
Neutro Abs: 3.2 10*3/uL (ref 1.7–7.7)
Neutrophils Relative %: 71 %
Platelets: 238 10*3/uL (ref 150–400)
RBC: 3.04 MIL/uL — ABNORMAL LOW (ref 3.87–5.11)
RDW: 14.2 % (ref 11.5–15.5)
WBC: 4.5 10*3/uL (ref 4.0–10.5)

## 2018-02-03 LAB — I-STAT TROPONIN, ED: TROPONIN I, POC: 0.07 ng/mL (ref 0.00–0.08)

## 2018-02-03 LAB — TROPONIN I: Troponin I: 0.03 ng/mL (ref <0.03)

## 2018-02-03 MED ORDER — KETOROLAC TROMETHAMINE 15 MG/ML IJ SOLN
15.0000 mg | Freq: Once | INTRAMUSCULAR | Status: AC
  Administered 2018-02-03: 15 mg via INTRAVENOUS
  Filled 2018-02-03: qty 1

## 2018-02-03 MED ORDER — ACETAMINOPHEN 500 MG PO TABS
1000.0000 mg | ORAL_TABLET | Freq: Once | ORAL | Status: DC
  Filled 2018-02-03: qty 2

## 2018-02-03 MED ORDER — POTASSIUM CHLORIDE CRYS ER 20 MEQ PO TBCR: 40.0000 meq | EXTENDED_RELEASE_TABLET | Freq: Once | ORAL | Status: DC

## 2018-02-03 MED ORDER — PROMETHAZINE HCL 25 MG PO TABS: 25.0000 mg | ORAL_TABLET | Freq: Four times a day (QID) | ORAL | 0 refills | Status: DC | PRN

## 2018-02-03 MED ORDER — ONDANSETRON HCL 4 MG/2ML IJ SOLN
4.0000 mg | Freq: Once | INTRAMUSCULAR | Status: AC
  Administered 2018-02-03: 4 mg via INTRAVENOUS
  Filled 2018-02-03: qty 2

## 2018-02-03 MED ORDER — POTASSIUM CHLORIDE CRYS ER 20 MEQ PO TBCR: 20.0000 meq | EXTENDED_RELEASE_TABLET | Freq: Every day | ORAL | 0 refills | Status: DC

## 2018-02-03 MED ORDER — SODIUM CHLORIDE 0.9 % IV BOLUS
1000.0000 mL | Freq: Once | INTRAVENOUS | Status: AC
  Administered 2018-02-03: 1000 mL via INTRAVENOUS

## 2018-02-03 MED ORDER — PROMETHAZINE HCL 25 MG/ML IJ SOLN
12.5000 mg | Freq: Once | INTRAMUSCULAR | Status: AC
  Administered 2018-02-03: 12.5 mg via INTRAMUSCULAR
  Filled 2018-02-03: qty 1

## 2018-02-03 NOTE — ED Triage Notes (Signed)
Pt arrived from home via EMS d/t weakness for the last couple days, reporting increased weakness this morning. Pt was d/c from hospital recently with infection to right thumb and pneumonia. Pt states she walked to bathroom and felt very weak. Pt states she was helped to the floor because she was too weak to stand.

## 2018-02-03 NOTE — Discharge Instructions (Addendum)
It was our pleasure to provide your ER care today - we hope that you feel better.  Rest. Drink adequate fluids.  From today's lab tests, your potassium level is mildly low (3.2) - eat plenty of fruits and vegetables, take potassium supplement as prescribed, and follow up with primary care doctor in the coming week for recheck.   We have arranged home health services, they should be coming out today.  Return to ER if worse, new symptoms, high fevers, trouble breathing, new or severe pain, other concern.

## 2018-02-03 NOTE — ED Notes (Signed)
Patient verbalizes understanding of discharge instructions. Opportunity for questioning and answers were provided. Armband removed by staff, pt discharged from ED ambulatory.   

## 2018-02-03 NOTE — Discharge Planning (Signed)
Lockie Bothun J. Clydene Laming, RN, BSN, [employer] [phone] Spoke with pt at bedside regarding discharge planning for [facility]. Offered pt list of home health agencies to choose from.  Pt chose [facility] to render services. Danny Lawless of [facility] notified. Patient made aware that [facility] will be in contact in 24hours.  DME needs identified at this time include 3n1.  [facility] notified to deliver 3n1 to pt room prior to discharge home today.

## 2018-02-03 NOTE — ED Provider Notes (Signed)
[facility] DEPARTMENT Provider Note   CSN: [id number] Arrival date & time: 02/03/18  0411     History   Chief Complaint Chief Complaint  Patient presents with  . Weakness    HPI Katherine [name] is a 62 y.o. female.  62 yo F with a chief complaint of weakness.  The patient was recently in the hospital for a prolonged stay due to a lesion to her right thumb.  She was admitted with some concern that this was infected.  She was on broad-spectrum antibiotics and then narrowed.  She was eventually taken off and discharged yesterday.  Discharge summary reviewed they felt unlikely that this was infected.  The patient also was treated for possible bronchitis/pneumonia.  She was hospitalized for 8 days.  The patient felt that she was discharged to early.  She felt that she did not feel well for the past couple days of her hospitalization and she was not taken seriously.  She thinks she must have pneumonia because she had symptoms like this before.  She feels that the only way that this can be identified is with a CT scan.  The history is provided by the patient.  Weakness  Primary symptoms include no dizziness. This is a new problem. The current episode started 2 days ago. The problem has not changed since onset.There was no focality noted. There has been no fever. Associated symptoms include vomiting. Pertinent negatives include no shortness of breath, no chest pain and no headaches.    Past Medical History:  Diagnosis Date  . Anemia   . Anxiety   . Bilateral carotid artery stenosis    Carotid duplex 11/979: 1-91% LICA, 47-82% RICA, >95% RECA, f/u 1 yr suggested  . CAD (coronary artery disease) of bypass graft 5/01; 3/'02, 8/'03, 10/'04; 1/15   PCI x 5 to SVG-D1   . CAD in native artery 07/1993    3 Vessel Disease (LAD-D1 & RCA) -- CABG  . CAD S/P percutaneous coronary angioplasty    PCI to SVG-D1 insertion/native D1 x 4 = '01 -(S660 BMS 2.5 x 9 - insertion  into D1; '02 - distal overlap ACS Pixel 2.5 x 8  BMS; '03 distal/native ISR/Thrombosis - Pixel 2.5 x 13; '04 - ISR-  Taxus 2.5 x 20 (covered all);; 1/15 - mid SVG-D1 (50% distal ISR) - Promus P 2.75 x 20 -- 2.8 mm  . COPD mixed type ([place])    Followed by Dr. Lamonte Sakai "pulmonologist said no COPD"  . Depression   . Depression with anxiety   . Diabetes mellitus type 2 in obese ([place])   . Diarrhea    started after cholecystectomy and mass removed from intestine  . Dyslipidemia, goal LDL below 70    08/2012: TC 137, TG 200, HDL 32!, LDL 45; on statin (followed by Dr.Deterding)  . ESRD (end stage renal disease) ([place]) 1991   s/p Cadaveric Renal Transplant [facility] - Dr. Jimmy Footman)   . Family history of adverse reaction to anesthesia    mom's bp dropped during/after anesthesia  . Fibromyalgia   . GERD (gastroesophageal reflux disease)   . Glomerulonephritis, chronic, rapidly progressive 14  . 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 07/1993   Rescue PTCA of RCA -- referred for CABG.  . H/O: GI bleed   . Headache    migraines in the past  . History of kidney stones   . Hypertension associated with diabetes ([place])   . MRSA (methicillin  resistant staph aureus) culture positive   . Obesity    s/p Lap Band 04/2004- Port Replacement 10/09 & 2/10 for infection; Dr. Excell Seltzer.  . OSA (obstructive sleep apnea)    no longer on CPAP or home O2, states she doesn't need now after lap band  . PAD (peripheral artery disease) ([place]) 08/2013   LEA Dopplers to be read by Dr. Fletcher Anon  . PAF (paroxysmal atrial fibrillation) ([place]) 06/2014   Noted on CardioNet Monitor  - rate ~112; no recurrent symptoms. Nonionic regulation because of frequent GI procedures. Patient prefers Plavix  . Pneumonia   . Recurrent boils    Bilateral Groin  . Rheumatoid arthritis ([place])    Per Patient Report; associated with OA  . 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 Servando Snare: LIMA-LAD, SVG-bifurcatingD1, SVG-rPDA  . S/p  cadaver renal transplant [facility]  . Stroke [facility]) 2012   "right eye stroke- half blind now"  . Unstable angina ([place]) 5/01; 3/'02, 8/'03, 10/'04; 1/15   x 5 occurences since Inf-Post STEMI in 1994    Patient Active Problem List   Diagnosis Date Noted  . 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 Cellulitis of right thumb 01/28/2018  . S/P kidney transplant 01/28/2018  . Status post arthroscopy of knee 12/03/2017  . Primary osteoarthritis of right knee 08/18/2017  . Aortic valve sclerosis 01/28/2017  . 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 Spondylosis of lumbar region without myelopathy or radiculopathy 03/30/2016  . Type 2 diabetes mellitus with peripheral neuropathy ([place]) 03/30/2016  . PAF (paroxysmal atrial fibrillation) ([place]) 06/28/2014  . Hypertension associated with diabetes ([place])   . PAD (peripheral artery disease) ([place]) 08/17/2013  . Stenosis of right carotid artery without infarction 10/08/2012  . Dyslipidemia, goal LDL below 70 10/08/2012    Class: Diagnosis of  . Mitral annular calcification 10/08/2012  . Renal transplant disorder 06/12/2012  . Chronic allergic rhinitis 04/29/2011  . Sleep apnea 09/09/2007  . Extrinsic asthma 09/09/2007  . GERD 09/09/2007  . Morbid obesity - s/p Lap Band 9/'05 05/07/2004    Class: Diagnosis of  . S/P CABG (coronary artery bypass graft) x 3 09/07/1993    Class: History of    Past Surgical History:  Procedure Laterality Date  . CARDIAC CATHETERIZATION  5/'01, 3/'02, 8/'03, 10/'04; 1/'15   08/22/2013: LAD & RCA 100%; LIMA-LAD & SVG-rPDA patent; Cx-- OM1 60%, OM2 ostial ~50%; SVG-D1 - 80% mid, 50% distal ISR --PCI  . CATHETER REMOVAL    . CHOLECYSTECTOMY N/A 10/29/2014   Procedure: LAPAROSCOPIC CHOLECYSTECTOMY WITH INTRAOPERATIVE CHOLANGIOGRAM;  Surgeon: Excell Seltzer, MD;  Location: [facility];  Service: General;  Laterality: N/A;  . CORONARY ANGIOPLASTY  1994   x5  . CORONARY ARTERY BYPASS GRAFT  1995   LIMA-LAD,  SVG-RPDA, SVG-D1  . ESOPHAGOGASTRODUODENOSCOPY N/A 10/15/2016   Procedure: ESOPHAGOGASTRODUODENOSCOPY (EGD);  Surgeon: Wilford Corner, MD;  Location: [facility] ENDOSCOPY;  Service: Endoscopy;  Laterality: N/A;  . I&D EXTREMITY Right 01/29/2018   Procedure: IRRIGATION AND DEBRIDEMENT THUMB;  Surgeon: Dayna Barker, MD;  Location: [facility];  Service: Plastics;  Laterality: Right;  . INCISE AND DRAIN ABCESS    . KIDNEY TRANSPLANT  1991  . KNEE ARTHROSCOPY WITH LATERAL MENISECTOMY Left 12/03/2017   Procedure: LEFT KNEE ARTHROSCOPY WITH LATERAL MENISECTOMY;  Surgeon: Earlie Server, MD;  Location: [place];  Service: Orthopedics;  Laterality: Left;  . LAPAROSCOPIC GASTRIC BANDING  04/2004; 10/'09, 2/'10   Port Replacement x 2  .  LEFT HEART CATHETERIZATION WITH CORONARY/GRAFT ANGIOGRAM N/A 08/23/2013   Procedure: LEFT HEART CATHETERIZATION WITH Beatrix Fetters;  Surgeon: Wellington Hampshire, MD;  Location: [facility] CATH LAB;  Service: Cardiovascular;  Laterality: N/A;  . Lower Extremity Arterial Dopplers  08/2013   ABI: R 0.96, L 1.04  . MULTIPLE TOOTH EXTRACTIONS  age 24  . NM MYOVIEW LTD  03/2016   EF 62%. LOW RISK. C/W prior MI - no Ischemia. Apical hypokinesis.  [name] Kitchen PERCUTANEOUS CORONARY STENT INTERVENTION (PCI-S)  5/'01, 3/'02, 8/'03, 10/'04;   '01 - S660 BMS 2.5 x 9 - dSVG-D1 into D1; '02- post-stent stenosis - 2.5 x 8 Pixel BMS; '8\03: ISR/Thrombosis into native D1 - AngioJet, 2.5 x 13 Pixel; '04 - ISR 95% - covered stented area with Taxus DES 2.5 mm x 20 (2.88)  . PERCUTANEOUS CORONARY STENT INTERVENTION (PCI-S)  08/23/2013   Procedure: PERCUTANEOUS CORONARY STENT INTERVENTION (PCI-S);  Surgeon: Wellington Hampshire, MD;  Location: [facility] CATH LAB;  Service: Cardiovascular;;mid SVG-D1 80%; distal stent ~50% ISR; Promus Pre[place] DES 2.75 mm xc 20 mm (2.8 mm)  . PORT-A-CATH REMOVAL     kidney  . TRANSTHORACIC ECHOCARDIOGRAM  02/2016   EF 55-60%. Septal dyssynergy from CABG GR 2 DD. Aortic valve is trileaflet the  functional bicuspid with sclerosis but not yet notable for stenosis stenosis.; no Significant change  . TUBAL LIGATION    . wrist fistula repair Left    dialysis for one year     OB History   None      Home Medications    Prior to Admission medications   Medication Sig Start Date End Date Taking? Authorizing Provider  acetaminophen (TYLENOL) 500 MG tablet Take 1 tablet (500 mg total) by mouth every 6 (six) hours as needed for moderate pain or headache. Patient taking differently: Take 1,000 mg by mouth every 6 (six) hours as needed for moderate pain or headache.  10/17/16  Yes Arrien, Jimmy Picket, MD  albuterol (VENTOLIN HFA) 108 (90 Base) MCG/ACT inhaler INHALE 2 PUFFS INTO THE LUNGS EVERY 6 (SIX) HOURS AS NEEDED FOR WHEEZING OR SHORTNESS OF BREATH. 06/14/17  Yes Collene Gobble, MD  azaTHIOprine (IMURAN) 50 MG tablet Take 125 mg by mouth every evening.    Yes [provider]  budesonide-formoterol (SYMBICORT) 160-4.5 MCG/ACT inhaler Inhale 2 puffs into the lungs 2 (two) times daily. 10/07/17  Yes Collene Gobble, MD  calcitRIOL (ROCALTROL) 0.25 MCG capsule Take 0.25 mcg by mouth every 3 (three) days.    Yes [provider]  carbamazepine (TEGRETOL) 200 MG tablet TAKE 200 MG BY MOUTH TWICE A DAY 01/11/18  Yes Meredith Staggers, MD  cefdinir (OMNICEF) 300 MG capsule Take 1 capsule (300 mg total) by mouth every 12 (twelve) hours for 5 days. 02/02/18 02/07/18 Yes Bonnielee Haff, MD  clopidogrel (PLAVIX) 75 MG tablet Take 1 tablet (75 mg total) by mouth daily. 01/26/17  Yes [name], MD  clotrimazole [facility]) 10 MG troche Take 1 tablet (10 mg total) by mouth 5 (five) times daily. Patient taking differently: Take 10 mg by mouth daily as needed (thrush).  10/21/16  Yes Domenic Polite, MD  cyclobenzaprine (FLEXERIL) 10 MG tablet TAKE 1 TABLET BY MOUTH 3 TIMES DAILY AS NEEDED FOR MUSCLE SPASMS Patient taking differently: Take 10 mg by mouth twice daily 10/19/17  Yes Meredith Staggers, MD  diltiazem (CARDIZEM CD) 120 MG 24 hr capsule TAKE 1 CAPSULE (120 MG TOTAL) BY MOUTH DAILY. NEED OV.  02/22/17  Yes [name], MD  DULoxetine (CYMBALTA) 30 MG capsule TAKE 2 CAPSULES (60 MG TOTAL) BY MOUTH EVERY MORNING. 01/26/18  Yes Meredith Staggers, MD  erythromycin base (E-MYCIN) 500 MG tablet Take 500 mg by mouth daily.   Yes [provider]  famotidine (PEPCID) 20 MG tablet Take 20 mg by mouth daily. 05/19/17  Yes [provider]  ferrous sulfate 325 (65 FE) MG tablet Take 1 tablet (325 mg total) by mouth 3 (three) times daily with meals. Patient taking differently: Take 325 mg by mouth 2 (two) times daily.  10/17/16  Yes Arrien, Jimmy Picket, MD  fluticasone [facility]) 50 MCG/ACT nasal spray Place 2 sprays into both nostrils 2 (two) times daily. 12/13/17  Yes Collene Gobble, MD  glimepiride (AMARYL) 4 MG tablet Take 4 mg by mouth every morning. 04/20/16  Yes [provider]  guaiFENesin (MUCINEX) 600 MG 12 hr tablet Take 1 tablet (600 mg total) by mouth 2 (two) times daily. 02/02/18  Yes Bonnielee Haff, MD  Insulin Glargine (BASAGLAR KWIKPEN) 100 UNIT/ML SOPN Inject 12 Units into the skin daily.   Yes [provider]  loperamide (IMODIUM A-D) 2 MG tablet Take 4 mg by mouth 2 (two) times daily.   Yes [provider]  LORazepam (ATIVAN) 1 MG tablet Take 1 mg by mouth every morning.    Yes [provider]  metFORMIN (GLUCOPHAGE) 500 MG tablet Take 500 mg by mouth 2 (two) times daily. 10/03/16  Yes [provider]  metoprolol tartrate (LOPRESSOR) 25 MG tablet TAKE 1/2 TABLET BY MOUTH DAILY ( TAKE A WHOLE TABLET DAILY IF SBP>150) Patient taking differently: Take 25 mg by mouth daily.  11/23/17  Yes [name], MD  montelukast (SINGULAIR) 10 MG tablet TAKE 1 TABLET BY MOUTH EVERY DAY 01/11/18  Yes Byrum, Rose Fillers, MD  nitroGLYCERIN (NITROSTAT) 0.4 MG SL tablet Place 1 tablet (0.4 mg total) under the tongue every 5  (five) minutes as needed for chest pain. 09/11/13  Yes [name], MD  NOVOLOG FLEXPEN 100 UNIT/ML FlexPen Inject 4 Units into the skin 2 (two) times daily as needed for high blood sugar.  12/13/17  Yes [provider]  omeprazole (PRILOSEC) 40 MG capsule Take 40 mg by mouth every morning.    Yes [provider]  oxyCODONE (OXY IR/ROXICODONE) 5 MG immediate release tablet Take 1 tablet (5 mg total) by mouth every 8 (eight) hours as needed for severe pain. 01/24/18  Yes Bayard Hugger, NP  predniSONE (DELTASONE) 5 MG tablet Take 5 mg by mouth daily with breakfast.   Yes [provider]  promethazine (PHENERGAN) 25 MG tablet Take 1 tablet (25 mg total) by mouth every 6 (six) hours as needed for nausea or vomiting (or sleep). 02/03/18  Yes Deno Etienne, DO  rosuvastatin (CRESTOR) 20 MG tablet Take 1 tablet (20 mg total) by mouth daily. 10/25/17  Yes [name], MD  sertraline (ZOLOFT) 100 MG tablet Take 100 mg by mouth every morning.   Yes [provider]    Family History Family History  Problem Relation Age of Onset  . Cancer Mother        liver  . Heart disease Father   . Cancer Father        colon  . Arrhythmia Brother        Atrial Fibrillation  . Arrhythmia Paternal Aunt        Atrial Fibrillation  Social History Social History   Tobacco Use  . Smoking status: Former Smoker    Packs/day: 1.00    Years: 30.00    Pack years: 30.00    Types: Cigarettes    Last attempt to quit: 08/17/2002    Years since quitting: 15.4  . Smokeless tobacco: Never Used  Substance Use Topics  . Alcohol use: No  . Drug use: No     Allergies   Tetracycline; Hydromorphone hcl; Niacin; Niaspan [niacin er]; Sulfa antibiotics; Sulfonamide derivatives; Codeine; Erythromycin; Hydromorphone hcl; Morphine and related; Nalbuphine; Sulfasalazine; and Tape   Review of Systems Review of Systems  Constitutional: Negative for chills and fever.  HENT: Negative  for congestion and rhinorrhea.   Eyes: Negative for redness and visual disturbance.  Respiratory: Negative for shortness of breath and wheezing.   Cardiovascular: Negative for chest pain and palpitations.  Gastrointestinal: Positive for nausea and vomiting. Negative for abdominal pain.  Genitourinary: Negative for dysuria and urgency.  Musculoskeletal: Positive for back pain. Negative for arthralgias and myalgias.  Skin: Negative for pallor and wound.  Neurological: Positive for syncope (near) and weakness (generalized). Negative for dizziness and headaches.     Physical Exam Updated Vital Signs BP 117/84   Pulse 87   Temp 99 F (37.2 C) (Oral)   Resp (!) 21   Ht 5\' 1"  (1.549 m)   Wt 105.2 kg (232 lb)   SpO2 96%   BMI 43.84 kg/m   Physical Exam  Constitutional: She is oriented to person, place, and time. She appears well-developed and well-nourished. No distress.  Morbidly obese  HENT:  Head: Normocephalic and atraumatic.  Eyes: Pupils are equal, round, and reactive to light. EOM are normal.  Neck: Normal range of motion. Neck supple.  Cardiovascular: Normal rate and regular rhythm. Exam reveals no gallop and no friction rub.  No murmur heard. Pulmonary/Chest: Effort normal. She has no wheezes. She has no rales.  Abdominal: Soft. She exhibits no distension and no mass. There is no tenderness. There is no guarding.  Musculoskeletal: She exhibits tenderness. She exhibits no edema.  Patient states that she is tender all throughout the back including the midline.  No focal tenderness  Neurological: She is alert and oriented to person, place, and time.  Skin: Skin is warm and dry. She is not diaphoretic.  Psychiatric: She has a normal mood and affect. Her behavior is normal.  Nursing note and vitals reviewed.    ED Treatments / Results  Labs (all labs ordered are listed, but only abnormal results are displayed) Labs Reviewed  CBC WITH DIFFERENTIAL/PLATELET - Abnormal;  Notable for the following components:      Result Value   RBC 3.04 (*)    Hemoglobin 9.6 (*)    HCT 29.3 (*)    Lymphs Abs 0.6 (*)    Abs Immature Granulocytes 0.2 (*)    All other components within normal limits  COMPREHENSIVE METABOLIC PANEL - Abnormal; Notable for the following components:   Potassium 3.2 (*)    Glucose, Bld 163 (*)    Total Protein 6.4 (*)    Albumin 3.0 (*)    All other components within normal limits  TROPONIN I  I-STAT TROPONIN, ED    EKG EKG Interpretation  Date/Time:  Thursday February 03 2018 04:23:36 EDT Ventricular Rate:  84 PR Interval:    QRS Duration: 92 QT Interval:  377 QTC Calculation: 446 R Axis:   -4 Text Interpretation:  Sinus rhythm Abnormal R-wave progression,  early transition Abnormal T, consider ischemia, lateral leads flipped t in lateral leads now isoelectric Otherwise no significant change Confirmed by Deno Etienne [phone]) on 02/03/2018 4:52:01 AM   Radiology Dg Chest 2 View  Result Date: 02/03/2018 CLINICAL DATA:  62 y/o  F; weakness for the last couple of days. EXAM: CHEST - 2 VIEW COMPARISON:  01/28/2018 chest radiograph. FINDINGS: Stable cardiomegaly given projection and technique. Post median sternotomy with multiple stable broken sternotomy wires. Aortic atherosclerosis with calcification. Clear lungs. No pleural effusion or pneumothorax. No acute osseous abnormality is evident. IMPRESSION: Stable cardiomegaly.  No acute pulmonary process identified. Electronically Signed   By: Kristine Garbe M.D.   On: 02/03/2018 04:58    Procedures Procedures (including critical care time)  Medications Ordered in ED Medications  acetaminophen (TYLENOL) tablet 1,000 mg (1,000 mg Oral Refused 02/03/18 0604)  sodium chloride 0.9 % bolus 1,000 mL (0 mLs Intravenous Stopped 02/03/18 0654)  ondansetron (ZOFRAN) injection 4 mg (4 mg Intravenous Not Given 02/03/18 0547)  ketorolac (TORADOL) 15 MG/ML injection 15 mg (15 mg Intravenous Given  02/03/18 0541)  promethazine (PHENERGAN) injection 12.5 mg (12.5 mg Intramuscular Given 02/03/18 0600)     Initial Impression / Assessment and Plan / ED Course  I have reviewed the triage vital signs and the nursing notes.  Pertinent labs & imaging results that were available during my care of the patient were reviewed by me and considered in my medical decision making (see chart for details).     62 yo F with a chief complaint of weakness.  Going on since she was discharged yesterday from the hospital.  She feels pain from the top of her spine to the bottom.  There is no focality.  She has had some nausea and vomiting at home.  She went to the bathroom today and felt like she was going to pass out and then lowered herself to the ground and felt like she could not stand up afterwards.    The patient's EKG is unremarkable.  Chest x-ray is negative for pneumonia as viewed by me.  It seems unlikely this is pneumonia as the patient is not coughing, her oxygen saturation is 100%.  She is afebrile here.  Her initial troponin is negative but on the higher end of the spectrum and higher than when she was admitted to the hospital.  I will have this repeated in 3 hours.  Turned over to Dr. Ashok Cordia.  Please see his note for further details of care in the ED.   The patients results and plan were reviewed and discussed.   Any x-rays performed were independently reviewed by myself.   Differential diagnosis were considered with the presenting HPI.  Medications  acetaminophen (TYLENOL) tablet 1,000 mg (1,000 mg Oral Refused 02/03/18 0604)  sodium chloride 0.9 % bolus 1,000 mL (0 mLs Intravenous Stopped 02/03/18 0654)  ondansetron (ZOFRAN) injection 4 mg (4 mg Intravenous Not Given 02/03/18 0547)  ketorolac (TORADOL) 15 MG/ML injection 15 mg (15 mg Intravenous Given 02/03/18 0541)  promethazine (PHENERGAN) injection 12.5 mg (12.5 mg Intramuscular Given 02/03/18 0600)    Vitals:   02/03/18 0500 02/03/18 0545  02/03/18 0615 02/03/18 0700  BP: (!) 146/75 (!) 151/61 123/72 117/84  Pulse: 94 84 82 87  Resp: (!) 22 (!) 22 18 (!) 21  Temp:      TempSrc:      SpO2: 99% 100% 99% 96%  Weight:      Height:  Final diagnoses:  Weakness  Nausea and vomiting in adult  Acute midline back pain, unspecified back location     Final Clinical Impressions(s) / ED Diagnoses   Final diagnoses:  Weakness  Nausea and vomiting in adult  Acute midline back pain, unspecified back location    ED Discharge Orders        Ordered    promethazine (PHENERGAN) 25 MG tablet  Every 6 hours PRN     02/03/18 [place], Gil[place], DO 02/03/18 7022

## 2018-02-03 NOTE — ED Notes (Signed)
Pt sleeping in room

## 2018-02-03 NOTE — ED Provider Notes (Signed)
Signed out by Dr Tyrone Nine that plan is for d/c to home, and that if/when repeat trop is not increasing, to d/c to home.  Repeat trop .03.   No chest pain or discomfort of any sort. Breathing comfortably, rr 16, sat 100% room air.  bp is normal.   Up to bathroom, tolerating fluids.  k is mildly low on labs, kcl po.  Case manager asked to assess - she indicates will arrange full home health services starting today.  Pt currently appears stable for d/c.      Lajean Saver, MD 02/03/18 [phone]

## 2018-02-03 NOTE — ED Notes (Signed)
IV team at bedside 

## 2018-02-03 NOTE — ED Notes (Signed)
Patient transported to X-ray 

## 2018-02-04 DIAGNOSIS — M069 Rheumatoid arthritis, unspecified: Secondary | ICD-10-CM | POA: Diagnosis not present

## 2018-02-04 DIAGNOSIS — Z7902 Long term (current) use of antithrombotics/antiplatelets: Secondary | ICD-10-CM | POA: Diagnosis not present

## 2018-02-04 DIAGNOSIS — F418 Other specified anxiety disorders: Secondary | ICD-10-CM | POA: Diagnosis not present

## 2018-02-04 DIAGNOSIS — E1151 Type 2 diabetes mellitus with diabetic peripheral angiopathy without gangrene: Secondary | ICD-10-CM | POA: Diagnosis not present

## 2018-02-04 DIAGNOSIS — Z940 Kidney transplant status: Secondary | ICD-10-CM | POA: Diagnosis not present

## 2018-02-04 DIAGNOSIS — Z794 Long term (current) use of insulin: Secondary | ICD-10-CM | POA: Diagnosis not present

## 2018-02-04 DIAGNOSIS — Z6841 Body Mass Index (BMI) 40.0 and over, adult: Secondary | ICD-10-CM | POA: Diagnosis not present

## 2018-02-04 DIAGNOSIS — C44602 Unspecified malignant neoplasm of skin of right upper limb, including shoulder: Secondary | ICD-10-CM | POA: Diagnosis not present

## 2018-02-04 DIAGNOSIS — Z8673 Personal history of transient ischemic attack (TIA), and cerebral infarction without residual deficits: Secondary | ICD-10-CM | POA: Diagnosis not present

## 2018-02-04 DIAGNOSIS — Z87891 Personal history of nicotine dependence: Secondary | ICD-10-CM | POA: Diagnosis not present

## 2018-02-04 DIAGNOSIS — D631 Anemia in chronic kidney disease: Secondary | ICD-10-CM | POA: Diagnosis not present

## 2018-02-04 DIAGNOSIS — I152 Hypertension secondary to endocrine disorders: Secondary | ICD-10-CM | POA: Diagnosis not present

## 2018-02-04 DIAGNOSIS — E1142 Type 2 diabetes mellitus with diabetic polyneuropathy: Secondary | ICD-10-CM | POA: Diagnosis not present

## 2018-02-04 DIAGNOSIS — E1122 Type 2 diabetes mellitus with diabetic chronic kidney disease: Secondary | ICD-10-CM | POA: Diagnosis not present

## 2018-02-04 DIAGNOSIS — Z951 Presence of aortocoronary bypass graft: Secondary | ICD-10-CM | POA: Diagnosis not present

## 2018-02-04 DIAGNOSIS — M797 Fibromyalgia: Secondary | ICD-10-CM | POA: Diagnosis not present

## 2018-02-04 DIAGNOSIS — J449 Chronic obstructive pulmonary disease, unspecified: Secondary | ICD-10-CM | POA: Diagnosis not present

## 2018-02-04 DIAGNOSIS — Z8614 Personal history of Methicillin resistant Staphylococcus aureus infection: Secondary | ICD-10-CM | POA: Diagnosis not present

## 2018-02-04 DIAGNOSIS — E669 Obesity, unspecified: Secondary | ICD-10-CM | POA: Diagnosis not present

## 2018-02-04 DIAGNOSIS — Z7952 Long term (current) use of systemic steroids: Secondary | ICD-10-CM | POA: Diagnosis not present

## 2018-02-04 DIAGNOSIS — N186 End stage renal disease: Secondary | ICD-10-CM | POA: Diagnosis not present

## 2018-02-04 DIAGNOSIS — I25709 Atherosclerosis of coronary artery bypass graft(s), unspecified, with unspecified angina pectoris: Secondary | ICD-10-CM | POA: Diagnosis not present

## 2018-02-04 DIAGNOSIS — Z79891 Long term (current) use of opiate analgesic: Secondary | ICD-10-CM | POA: Diagnosis not present

## 2018-02-07 ENCOUNTER — Telehealth: Payer: Self-pay | Admitting: Internal Medicine

## 2018-02-07 NOTE — Telephone Encounter (Signed)
Copied from [place] [id number]. Topic: Quick Communication - See Telephone Encounter >> Feb 07, 2018  4:29 PM Bea Graff, NT wrote: CRM for notification. See Telephone encounter for: 02/07/18. Brandy with [facility] calling to get verbal orders for nursing for 2 times a week for 3 weeks. CB#: [phone]

## 2018-02-07 NOTE — Telephone Encounter (Signed)
Called [place] with [place] and gave verbal okay as requested.

## 2018-02-08 DIAGNOSIS — I25709 Atherosclerosis of coronary artery bypass graft(s), unspecified, with unspecified angina pectoris: Secondary | ICD-10-CM | POA: Diagnosis not present

## 2018-02-08 DIAGNOSIS — D631 Anemia in chronic kidney disease: Secondary | ICD-10-CM | POA: Diagnosis not present

## 2018-02-08 DIAGNOSIS — I152 Hypertension secondary to endocrine disorders: Secondary | ICD-10-CM | POA: Diagnosis not present

## 2018-02-08 DIAGNOSIS — N186 End stage renal disease: Secondary | ICD-10-CM | POA: Diagnosis not present

## 2018-02-08 DIAGNOSIS — C44602 Unspecified malignant neoplasm of skin of right upper limb, including shoulder: Secondary | ICD-10-CM | POA: Diagnosis not present

## 2018-02-08 DIAGNOSIS — E1122 Type 2 diabetes mellitus with diabetic chronic kidney disease: Secondary | ICD-10-CM | POA: Diagnosis not present

## 2018-02-09 DIAGNOSIS — D631 Anemia in chronic kidney disease: Secondary | ICD-10-CM | POA: Diagnosis not present

## 2018-02-09 DIAGNOSIS — C44602 Unspecified malignant neoplasm of skin of right upper limb, including shoulder: Secondary | ICD-10-CM | POA: Diagnosis not present

## 2018-02-09 DIAGNOSIS — I25709 Atherosclerosis of coronary artery bypass graft(s), unspecified, with unspecified angina pectoris: Secondary | ICD-10-CM | POA: Diagnosis not present

## 2018-02-09 DIAGNOSIS — E1122 Type 2 diabetes mellitus with diabetic chronic kidney disease: Secondary | ICD-10-CM | POA: Diagnosis not present

## 2018-02-09 DIAGNOSIS — I152 Hypertension secondary to endocrine disorders: Secondary | ICD-10-CM | POA: Diagnosis not present

## 2018-02-09 DIAGNOSIS — N186 End stage renal disease: Secondary | ICD-10-CM | POA: Diagnosis not present

## 2018-02-11 ENCOUNTER — Other Ambulatory Visit: Payer: Self-pay | Admitting: Cardiology

## 2018-02-11 DIAGNOSIS — D631 Anemia in chronic kidney disease: Secondary | ICD-10-CM | POA: Diagnosis not present

## 2018-02-11 DIAGNOSIS — I25709 Atherosclerosis of coronary artery bypass graft(s), unspecified, with unspecified angina pectoris: Secondary | ICD-10-CM | POA: Diagnosis not present

## 2018-02-11 DIAGNOSIS — I152 Hypertension secondary to endocrine disorders: Secondary | ICD-10-CM | POA: Diagnosis not present

## 2018-02-11 DIAGNOSIS — E1122 Type 2 diabetes mellitus with diabetic chronic kidney disease: Secondary | ICD-10-CM | POA: Diagnosis not present

## 2018-02-11 DIAGNOSIS — C44602 Unspecified malignant neoplasm of skin of right upper limb, including shoulder: Secondary | ICD-10-CM | POA: Diagnosis not present

## 2018-02-11 DIAGNOSIS — N186 End stage renal disease: Secondary | ICD-10-CM | POA: Diagnosis not present

## 2018-02-12 ENCOUNTER — Other Ambulatory Visit: Payer: Self-pay | Admitting: Cardiology

## 2018-02-14 ENCOUNTER — Telehealth: Payer: Self-pay | Admitting: *Deleted

## 2018-02-14 DIAGNOSIS — I25709 Atherosclerosis of coronary artery bypass graft(s), unspecified, with unspecified angina pectoris: Secondary | ICD-10-CM | POA: Diagnosis not present

## 2018-02-14 DIAGNOSIS — J449 Chronic obstructive pulmonary disease, unspecified: Secondary | ICD-10-CM | POA: Diagnosis not present

## 2018-02-14 DIAGNOSIS — Z7952 Long term (current) use of systemic steroids: Secondary | ICD-10-CM

## 2018-02-14 DIAGNOSIS — C44602 Unspecified malignant neoplasm of skin of right upper limb, including shoulder: Secondary | ICD-10-CM | POA: Diagnosis not present

## 2018-02-14 DIAGNOSIS — N186 End stage renal disease: Secondary | ICD-10-CM | POA: Diagnosis not present

## 2018-02-14 DIAGNOSIS — Z8673 Personal history of transient ischemic attack (TIA), and cerebral infarction without residual deficits: Secondary | ICD-10-CM

## 2018-02-14 DIAGNOSIS — Z940 Kidney transplant status: Secondary | ICD-10-CM

## 2018-02-14 DIAGNOSIS — E669 Obesity, unspecified: Secondary | ICD-10-CM

## 2018-02-14 DIAGNOSIS — E1151 Type 2 diabetes mellitus with diabetic peripheral angiopathy without gangrene: Secondary | ICD-10-CM | POA: Diagnosis not present

## 2018-02-14 DIAGNOSIS — M797 Fibromyalgia: Secondary | ICD-10-CM | POA: Diagnosis not present

## 2018-02-14 DIAGNOSIS — Z6841 Body Mass Index (BMI) 40.0 and over, adult: Secondary | ICD-10-CM

## 2018-02-14 DIAGNOSIS — I152 Hypertension secondary to endocrine disorders: Secondary | ICD-10-CM | POA: Diagnosis not present

## 2018-02-14 DIAGNOSIS — D631 Anemia in chronic kidney disease: Secondary | ICD-10-CM | POA: Diagnosis not present

## 2018-02-14 DIAGNOSIS — E1142 Type 2 diabetes mellitus with diabetic polyneuropathy: Secondary | ICD-10-CM | POA: Diagnosis not present

## 2018-02-14 DIAGNOSIS — E1122 Type 2 diabetes mellitus with diabetic chronic kidney disease: Secondary | ICD-10-CM | POA: Diagnosis not present

## 2018-02-14 DIAGNOSIS — M47816 Spondylosis without myelopathy or radiculopathy, lumbar region: Secondary | ICD-10-CM

## 2018-02-14 DIAGNOSIS — Z87891 Personal history of nicotine dependence: Secondary | ICD-10-CM

## 2018-02-14 DIAGNOSIS — Z79891 Long term (current) use of opiate analgesic: Secondary | ICD-10-CM

## 2018-02-14 DIAGNOSIS — Z794 Long term (current) use of insulin: Secondary | ICD-10-CM

## 2018-02-14 DIAGNOSIS — Z7902 Long term (current) use of antithrombotics/antiplatelets: Secondary | ICD-10-CM

## 2018-02-14 DIAGNOSIS — F418 Other specified anxiety disorders: Secondary | ICD-10-CM | POA: Diagnosis not present

## 2018-02-14 DIAGNOSIS — Z8614 Personal history of Methicillin resistant Staphylococcus aureus infection: Secondary | ICD-10-CM

## 2018-02-14 DIAGNOSIS — Z951 Presence of aortocoronary bypass graft: Secondary | ICD-10-CM

## 2018-02-14 DIAGNOSIS — M069 Rheumatoid arthritis, unspecified: Secondary | ICD-10-CM | POA: Diagnosis not present

## 2018-02-14 MED ORDER — OXYCODONE HCL 5 MG PO TABS: 5.0000 mg | ORAL_TABLET | Freq: Three times a day (TID) | ORAL | 0 refills | Status: DC | PRN

## 2018-02-14 NOTE — Telephone Encounter (Signed)
Katherine [name] is calling for Katherine [name] (she was told to call in about 3 weeks) and is reporting that she had to have lesion on thumb removed. She is home form hospital and needs a refill on her medication.  She does not have appt except with Katherine [name] on 04/25/18.  Please advise.

## 2018-02-14 NOTE — Telephone Encounter (Signed)
Return Katherine [name] call, Chart Reviewed.  She was hospitalized at [facility] on 01/28/2018 and discharged 02/02/2018  For right Thumb lesion. Also diagnosed with Squamous Cell Carcinoma.  Katherine [name] was looking for a refill on her Oxycodone, last seen in the office on 01/24/2018. She was asked to count her Oxycodone tablets she states she has been using her medication daily and at times twice a day. According to the PMP Aware web-site last prescription filled on 01/24/2018. We will e-scribe her prescription, she was allowed to come to office every three months because she was using medication sparingly. Explained to Katherine [name] if she needs the medication on a regular basis she will need to be seen more frequently, she states " I don't think I need to be seen until September, reiterated the above and she verbalizes understanding. Only one prescription will be e-scribe if she calls for a refill she will need to be seen in our office. She verbalizes understanding.

## 2018-02-15 DIAGNOSIS — I25709 Atherosclerosis of coronary artery bypass graft(s), unspecified, with unspecified angina pectoris: Secondary | ICD-10-CM | POA: Diagnosis not present

## 2018-02-15 DIAGNOSIS — D631 Anemia in chronic kidney disease: Secondary | ICD-10-CM | POA: Diagnosis not present

## 2018-02-15 DIAGNOSIS — E1122 Type 2 diabetes mellitus with diabetic chronic kidney disease: Secondary | ICD-10-CM | POA: Diagnosis not present

## 2018-02-15 DIAGNOSIS — C44602 Unspecified malignant neoplasm of skin of right upper limb, including shoulder: Secondary | ICD-10-CM | POA: Diagnosis not present

## 2018-02-15 DIAGNOSIS — N186 End stage renal disease: Secondary | ICD-10-CM | POA: Diagnosis not present

## 2018-02-15 DIAGNOSIS — I152 Hypertension secondary to endocrine disorders: Secondary | ICD-10-CM | POA: Diagnosis not present

## 2018-02-17 DIAGNOSIS — I25709 Atherosclerosis of coronary artery bypass graft(s), unspecified, with unspecified angina pectoris: Secondary | ICD-10-CM | POA: Diagnosis not present

## 2018-02-17 DIAGNOSIS — D631 Anemia in chronic kidney disease: Secondary | ICD-10-CM | POA: Diagnosis not present

## 2018-02-17 DIAGNOSIS — C44602 Unspecified malignant neoplasm of skin of right upper limb, including shoulder: Secondary | ICD-10-CM | POA: Diagnosis not present

## 2018-02-17 DIAGNOSIS — I152 Hypertension secondary to endocrine disorders: Secondary | ICD-10-CM | POA: Diagnosis not present

## 2018-02-17 DIAGNOSIS — N186 End stage renal disease: Secondary | ICD-10-CM | POA: Diagnosis not present

## 2018-02-17 DIAGNOSIS — E1122 Type 2 diabetes mellitus with diabetic chronic kidney disease: Secondary | ICD-10-CM | POA: Diagnosis not present

## 2018-02-18 DIAGNOSIS — D631 Anemia in chronic kidney disease: Secondary | ICD-10-CM | POA: Diagnosis not present

## 2018-02-18 DIAGNOSIS — I152 Hypertension secondary to endocrine disorders: Secondary | ICD-10-CM | POA: Diagnosis not present

## 2018-02-18 DIAGNOSIS — C44602 Unspecified malignant neoplasm of skin of right upper limb, including shoulder: Secondary | ICD-10-CM | POA: Diagnosis not present

## 2018-02-18 DIAGNOSIS — E1122 Type 2 diabetes mellitus with diabetic chronic kidney disease: Secondary | ICD-10-CM | POA: Diagnosis not present

## 2018-02-18 DIAGNOSIS — I25709 Atherosclerosis of coronary artery bypass graft(s), unspecified, with unspecified angina pectoris: Secondary | ICD-10-CM | POA: Diagnosis not present

## 2018-02-18 DIAGNOSIS — N186 End stage renal disease: Secondary | ICD-10-CM | POA: Diagnosis not present

## 2018-02-24 DIAGNOSIS — E1122 Type 2 diabetes mellitus with diabetic chronic kidney disease: Secondary | ICD-10-CM | POA: Diagnosis not present

## 2018-02-24 DIAGNOSIS — D631 Anemia in chronic kidney disease: Secondary | ICD-10-CM | POA: Diagnosis not present

## 2018-02-24 DIAGNOSIS — I152 Hypertension secondary to endocrine disorders: Secondary | ICD-10-CM | POA: Diagnosis not present

## 2018-02-24 DIAGNOSIS — I25709 Atherosclerosis of coronary artery bypass graft(s), unspecified, with unspecified angina pectoris: Secondary | ICD-10-CM | POA: Diagnosis not present

## 2018-02-24 DIAGNOSIS — N186 End stage renal disease: Secondary | ICD-10-CM | POA: Diagnosis not present

## 2018-02-24 DIAGNOSIS — C44602 Unspecified malignant neoplasm of skin of right upper limb, including shoulder: Secondary | ICD-10-CM | POA: Diagnosis not present

## 2018-02-25 DIAGNOSIS — I25709 Atherosclerosis of coronary artery bypass graft(s), unspecified, with unspecified angina pectoris: Secondary | ICD-10-CM | POA: Diagnosis not present

## 2018-02-25 DIAGNOSIS — E1122 Type 2 diabetes mellitus with diabetic chronic kidney disease: Secondary | ICD-10-CM | POA: Diagnosis not present

## 2018-02-25 DIAGNOSIS — I152 Hypertension secondary to endocrine disorders: Secondary | ICD-10-CM | POA: Diagnosis not present

## 2018-02-25 DIAGNOSIS — N186 End stage renal disease: Secondary | ICD-10-CM | POA: Diagnosis not present

## 2018-02-25 DIAGNOSIS — C44602 Unspecified malignant neoplasm of skin of right upper limb, including shoulder: Secondary | ICD-10-CM | POA: Diagnosis not present

## 2018-02-25 DIAGNOSIS — D631 Anemia in chronic kidney disease: Secondary | ICD-10-CM | POA: Diagnosis not present

## 2018-03-03 DIAGNOSIS — I25709 Atherosclerosis of coronary artery bypass graft(s), unspecified, with unspecified angina pectoris: Secondary | ICD-10-CM | POA: Diagnosis not present

## 2018-03-03 DIAGNOSIS — C44602 Unspecified malignant neoplasm of skin of right upper limb, including shoulder: Secondary | ICD-10-CM | POA: Diagnosis not present

## 2018-03-03 DIAGNOSIS — N186 End stage renal disease: Secondary | ICD-10-CM | POA: Diagnosis not present

## 2018-03-03 DIAGNOSIS — D631 Anemia in chronic kidney disease: Secondary | ICD-10-CM | POA: Diagnosis not present

## 2018-03-03 DIAGNOSIS — I152 Hypertension secondary to endocrine disorders: Secondary | ICD-10-CM | POA: Diagnosis not present

## 2018-03-03 DIAGNOSIS — E1122 Type 2 diabetes mellitus with diabetic chronic kidney disease: Secondary | ICD-10-CM | POA: Diagnosis not present

## 2018-03-09 DIAGNOSIS — I152 Hypertension secondary to endocrine disorders: Secondary | ICD-10-CM | POA: Diagnosis not present

## 2018-03-09 DIAGNOSIS — N186 End stage renal disease: Secondary | ICD-10-CM | POA: Diagnosis not present

## 2018-03-09 DIAGNOSIS — E1122 Type 2 diabetes mellitus with diabetic chronic kidney disease: Secondary | ICD-10-CM | POA: Diagnosis not present

## 2018-03-09 DIAGNOSIS — C44602 Unspecified malignant neoplasm of skin of right upper limb, including shoulder: Secondary | ICD-10-CM | POA: Diagnosis not present

## 2018-03-09 DIAGNOSIS — I25709 Atherosclerosis of coronary artery bypass graft(s), unspecified, with unspecified angina pectoris: Secondary | ICD-10-CM | POA: Diagnosis not present

## 2018-03-09 DIAGNOSIS — D631 Anemia in chronic kidney disease: Secondary | ICD-10-CM | POA: Diagnosis not present

## 2018-03-11 ENCOUNTER — Other Ambulatory Visit: Payer: Self-pay | Admitting: Physical Medicine & Rehabilitation

## 2018-03-11 DIAGNOSIS — M47816 Spondylosis without myelopathy or radiculopathy, lumbar region: Secondary | ICD-10-CM

## 2018-03-11 DIAGNOSIS — M797 Fibromyalgia: Secondary | ICD-10-CM

## 2018-03-16 ENCOUNTER — Telehealth: Payer: Self-pay | Admitting: Cardiology

## 2018-03-16 NOTE — Telephone Encounter (Signed)
Returned call to patient of Dr. Ellyn Hack. She states she changed her metoprolol tartrate herself to 1 tablet BID d/t BP issues. She has been taking it this way for 6 months - there are no phone notes in Epic indicating she called office to notify of elevated BP. She reports her BP has been running 150/90s in the AM and her oxycodone raises her BP & HR so she has to take another whole tablet. She reports her HR runs in the 70s-80s.  Our records indicate that metoprolol tartate should be 12.5mg  daily and can increase to whole tablet if SBP >150.   Routed to MD to review and advise if OK to fill as patient as been taking.  She needs an Rx sent to [place]. She would like a call back.

## 2018-03-16 NOTE — Telephone Encounter (Signed)
New Message:       Pt c/o medication issue:  1. Name of Medication: metoprolol tartrate (LOPRESSOR) 25 MG tablet  2. How are you currently taking this medication (dosage and times per day)? TAKE 1/2 TABLET BY MOUTH DAILY ( TAKE A WHOLE TABLET DAILY IF SBP>150) Patient taking differently: Take 25 mg by mouth daily.   3. Are you having a reaction (difficulty breathing--STAT)? No  4. What is your medication issue? Pt states she needs a new prescription sent in to the pharmacy for this medication due to her now taking 2 tablets a day.

## 2018-03-16 NOTE — Telephone Encounter (Signed)
OK to increase to 25 mg BID.  [name], MD

## 2018-03-17 MED ORDER — METOPROLOL TARTRATE 25 MG PO TABS: 25.0000 mg | ORAL_TABLET | Freq: Two times a day (BID) | ORAL | 1 refills | Status: DC

## 2018-03-17 NOTE — Telephone Encounter (Signed)
Patient called with MD OK to change med dose. Rx(s) sent to pharmacy electronically.

## 2018-03-21 ENCOUNTER — Telehealth: Payer: Self-pay | Admitting: Cardiology

## 2018-03-21 DIAGNOSIS — Z7902 Long term (current) use of antithrombotics/antiplatelets: Secondary | ICD-10-CM | POA: Diagnosis not present

## 2018-03-21 DIAGNOSIS — S61001A Unspecified open wound of right thumb without damage to nail, initial encounter: Secondary | ICD-10-CM | POA: Diagnosis not present

## 2018-03-21 DIAGNOSIS — E1165 Type 2 diabetes mellitus with hyperglycemia: Secondary | ICD-10-CM | POA: Diagnosis not present

## 2018-03-21 DIAGNOSIS — C44622 Squamous cell carcinoma of skin of right upper limb, including shoulder: Secondary | ICD-10-CM | POA: Diagnosis not present

## 2018-03-21 DIAGNOSIS — Z794 Long term (current) use of insulin: Secondary | ICD-10-CM | POA: Diagnosis not present

## 2018-03-21 DIAGNOSIS — M20091 Other deformity of right finger(s): Secondary | ICD-10-CM | POA: Diagnosis not present

## 2018-03-21 DIAGNOSIS — Z85828 Personal history of other malignant neoplasm of skin: Secondary | ICD-10-CM | POA: Diagnosis not present

## 2018-03-21 NOTE — Telephone Encounter (Signed)
Left message for patient to call back.  62 yo female with hx of CAD s/p CABG and multiple PCI procedures to the S-D1 since her CABG, carotid dz, PAD, PAF, obesity s/p Lap Band, ESRD s/p renal Tx, COPD, IDDM, HTN, HLP.  Last seen by Dr. Ellyn Hack in 09/2017.  She was cleared for knee replacement at that time.  Echo 3/19: EF 55-60, Gr 2 DD, mild AS mean 8 mmHg  Nuc 03/19/16:  Ant-apical scar, no ischemia, EF 62, low risk  LHC 1/15:   DES to S-D1  RCRI:  6.6 (high risk)  Richardson Dopp, PA-C    03/21/2018 5:34 PM

## 2018-03-21 NOTE — Telephone Encounter (Signed)
° °   [facility] Pre-operative Risk Assessment    Request for surgical clearance:  What type of surgery is being performed? INTEGRA PLACEMENT RIGHT THUMB, WITH WOUND VAC 1. When is this surgery scheduled? 03/25/2018  2. What type of clearance is required (medical clearance vs. Pharmacy clearance to hold med vs. Both)? BOTH  3. Are there any medications that need to be held prior to surgery and how long? PLAVIX  4. Practice name and name of physician performing surgery? DR Jackey Loge, [facility]  5. What is your office phone number [phone] [place]   7.   What is your office fax number [phone]  8.   Anesthesia type (None, local, MAC, general) ? [place] 03/21/2018, 4:39 PM  _________________________________________________________________   (provider comments below)

## 2018-03-22 ENCOUNTER — Telehealth: Payer: Self-pay | Admitting: *Deleted

## 2018-03-22 NOTE — Telephone Encounter (Signed)
Patient is requesting medications and she was told that she needed an appointment.  She said she wasn't coming in because she just had surgery.  She would like to speak with Dr. Naaman Plummer about this.  I explained to her that he wasn't in office and would be here tomorrow.

## 2018-03-22 NOTE — Telephone Encounter (Signed)
She was seen in office on 01/24/2018, she needs to schedule an appointment to be seen.

## 2018-03-22 NOTE — Telephone Encounter (Signed)
Attempted to reach but she was asleep. I told her husband we would call back.

## 2018-03-22 NOTE — Telephone Encounter (Signed)
Katherine [name] is requesting a refill on her medication oxycodone. 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 She says she took more due to the pain and a UTI.  She has 9 left. According to PMP she filled her last Rx on 03/02/18. Please advise.

## 2018-03-22 NOTE — Telephone Encounter (Signed)
   Primary Cardiologist: [name], MD  Chart reviewed as part of pre-operative protocol coverage. Patient was contacted 03/22/2018 in reference to pre-operative risk assessment for pending surgery as outlined below.  Katherine [name] was last seen on 10/07/17 by Dr. Ellyn Hack.  Since that day, Katherine [name] has remained stable.  In 09/2017, she was cleared for knee surgery.  She held her Plavix at that time for 5-7 days and she had no cardiac complications.    RCRI:  6.6 DASI:  4.3 METs High risk; adequate functional capacity.  She denies chest pain or shortness of breath.  She is limited by calf pain and knee pain.  Therefore, based on ACC/AHA guidelines, the patient would be at acceptable risk for the planned procedure without further cardiovascular testing.  She may hold her Plavix for her procedure later this week and resume post op as soon as it is safe.  Call back staff:  I have faxed a letter to the requesting surgeon.  Please contact their office to make sure it was received.    Please schedule follow up with Dr. Ellyn Hack (she is due this month for follow up).   I will remove from pre-op pool.   Richardson Dopp, PA-C 03/22/2018, 1:53 PM

## 2018-03-22 NOTE — Telephone Encounter (Signed)
Clearance letter not received. I will manually fax clearance letter to fax # [phone] to [facility]. Percival Spanish their office will let the pt know to hold her Plavix as of today for surgery 03/25/18, this is per Richardson Dopp, PA .

## 2018-03-23 NOTE — Telephone Encounter (Signed)
I spoke with Dr Naaman Plummer. He wanted to know who she called to get permission to increase her pain medication?--she will not get a refill without being seen. If the surgical pain is that intense, the surgeon needs to be writing a prescription. I relayed this information to Elliott. She was unwilling to listen to what Dr Naaman Plummer was saying TODAY about her medications. She insists he always writes her two prescriptions and does not need to be seen. I reviewed policy about being seen for narcotic prescriptions. She says her surgeon said since Dr Naaman Plummer is pain management he should write her medication. I asked if she or he notified us of this fact? She agreed they did not. Aamya just assumed she could take what she wanted (even for a UTI) and call for more meds when she needed it. I explained that we do not handle narcotics in this manner.  She insisted she did not have time and could not come today or tomorrow  was offended I would asked her to get out of bed to come in for appointment. I said I am sorry but you will need to be seen for a refill. (There is a note where [name] filled her medication in July without appt and told her she would need to be seen more frequently if she is requiring more medication and even then she was insisting she did not need to be seen until September). .She hung up saying she will let the surgeon know how we are treating her.

## 2018-03-23 NOTE — Telephone Encounter (Signed)
Katherine [name] called back asking to speak with Dr Naaman Plummer. She was not happy that [name] said  she needs an appt to get a refill. She has not been seen in this office since June . She is insisting Dr Naaman Plummer give her a prescription for her pain because she took more from her surgery and a UTI. She insists she cannot come to an appointment because she does not have time. She is having surgery again on Friday and is meeting with anesthesiologist this am. Should she not be getting medication from the surgeon if this surgery on her thumb is so painful?

## 2018-03-24 DIAGNOSIS — I132 Hypertensive heart and chronic kidney disease with heart failure and with stage 5 chronic kidney disease, or end stage renal disease: Secondary | ICD-10-CM | POA: Diagnosis not present

## 2018-03-24 DIAGNOSIS — N186 End stage renal disease: Secondary | ICD-10-CM | POA: Diagnosis not present

## 2018-03-24 DIAGNOSIS — I447 Left bundle-branch block, unspecified: Secondary | ICD-10-CM | POA: Diagnosis not present

## 2018-03-24 DIAGNOSIS — Z794 Long term (current) use of insulin: Secondary | ICD-10-CM | POA: Diagnosis not present

## 2018-03-24 DIAGNOSIS — F419 Anxiety disorder, unspecified: Secondary | ICD-10-CM | POA: Diagnosis not present

## 2018-03-24 DIAGNOSIS — C44622 Squamous cell carcinoma of skin of right upper limb, including shoulder: Secondary | ICD-10-CM | POA: Diagnosis not present

## 2018-03-24 DIAGNOSIS — Z9884 Bariatric surgery status: Secondary | ICD-10-CM | POA: Diagnosis not present

## 2018-03-24 DIAGNOSIS — E1122 Type 2 diabetes mellitus with diabetic chronic kidney disease: Secondary | ICD-10-CM | POA: Diagnosis not present

## 2018-03-24 DIAGNOSIS — I509 Heart failure, unspecified: Secondary | ICD-10-CM | POA: Diagnosis not present

## 2018-03-24 DIAGNOSIS — Z951 Presence of aortocoronary bypass graft: Secondary | ICD-10-CM | POA: Diagnosis not present

## 2018-03-24 DIAGNOSIS — E1142 Type 2 diabetes mellitus with diabetic polyneuropathy: Secondary | ICD-10-CM | POA: Diagnosis not present

## 2018-03-24 DIAGNOSIS — K219 Gastro-esophageal reflux disease without esophagitis: Secondary | ICD-10-CM | POA: Diagnosis not present

## 2018-03-24 DIAGNOSIS — J449 Chronic obstructive pulmonary disease, unspecified: Secondary | ICD-10-CM | POA: Diagnosis not present

## 2018-03-24 DIAGNOSIS — Z6841 Body Mass Index (BMI) 40.0 and over, adult: Secondary | ICD-10-CM | POA: Diagnosis not present

## 2018-03-24 DIAGNOSIS — Z87891 Personal history of nicotine dependence: Secondary | ICD-10-CM | POA: Diagnosis not present

## 2018-03-24 DIAGNOSIS — E785 Hyperlipidemia, unspecified: Secondary | ICD-10-CM | POA: Diagnosis not present

## 2018-03-24 DIAGNOSIS — S61001A Unspecified open wound of right thumb without damage to nail, initial encounter: Secondary | ICD-10-CM | POA: Diagnosis not present

## 2018-03-24 DIAGNOSIS — G4733 Obstructive sleep apnea (adult) (pediatric): Secondary | ICD-10-CM | POA: Diagnosis not present

## 2018-03-24 DIAGNOSIS — M797 Fibromyalgia: Secondary | ICD-10-CM | POA: Diagnosis not present

## 2018-03-24 DIAGNOSIS — F329 Major depressive disorder, single episode, unspecified: Secondary | ICD-10-CM | POA: Diagnosis not present

## 2018-03-24 DIAGNOSIS — I4891 Unspecified atrial fibrillation: Secondary | ICD-10-CM | POA: Diagnosis not present

## 2018-03-25 DIAGNOSIS — Z85828 Personal history of other malignant neoplasm of skin: Secondary | ICD-10-CM | POA: Diagnosis not present

## 2018-03-25 DIAGNOSIS — I509 Heart failure, unspecified: Secondary | ICD-10-CM | POA: Diagnosis not present

## 2018-03-25 DIAGNOSIS — E1122 Type 2 diabetes mellitus with diabetic chronic kidney disease: Secondary | ICD-10-CM | POA: Diagnosis not present

## 2018-03-25 DIAGNOSIS — C44622 Squamous cell carcinoma of skin of right upper limb, including shoulder: Secondary | ICD-10-CM | POA: Diagnosis not present

## 2018-03-25 DIAGNOSIS — Z483 Aftercare following surgery for neoplasm: Secondary | ICD-10-CM | POA: Diagnosis not present

## 2018-03-25 DIAGNOSIS — I132 Hypertensive heart and chronic kidney disease with heart failure and with stage 5 chronic kidney disease, or end stage renal disease: Secondary | ICD-10-CM | POA: Diagnosis not present

## 2018-03-25 DIAGNOSIS — N186 End stage renal disease: Secondary | ICD-10-CM | POA: Diagnosis not present

## 2018-03-25 DIAGNOSIS — S61001A Unspecified open wound of right thumb without damage to nail, initial encounter: Secondary | ICD-10-CM | POA: Diagnosis not present

## 2018-03-31 ENCOUNTER — Telehealth: Payer: Self-pay | Admitting: Emergency Medicine

## 2018-03-31 ENCOUNTER — Ambulatory Visit: Payer: Medicare Other | Admitting: Internal Medicine

## 2018-03-31 NOTE — Telephone Encounter (Signed)
Thank you very much 

## 2018-03-31 NOTE — Telephone Encounter (Signed)
Spoke with pt, she had a removed from her thumb twice, once at the beginning of June, and dealt with sepsis and pneumonia at the same time. The second removal, was done at [facility]. She just wanted to let you know since she has not been able to see you. She states as soon as all this clears, she will make an appt.  FYI RB. [name] Kitchen

## 2018-04-08 DIAGNOSIS — I447 Left bundle-branch block, unspecified: Secondary | ICD-10-CM | POA: Diagnosis not present

## 2018-04-08 DIAGNOSIS — Z6841 Body Mass Index (BMI) 40.0 and over, adult: Secondary | ICD-10-CM | POA: Diagnosis not present

## 2018-04-08 DIAGNOSIS — I132 Hypertensive heart and chronic kidney disease with heart failure and with stage 5 chronic kidney disease, or end stage renal disease: Secondary | ICD-10-CM | POA: Diagnosis not present

## 2018-04-08 DIAGNOSIS — Z85828 Personal history of other malignant neoplasm of skin: Secondary | ICD-10-CM | POA: Diagnosis not present

## 2018-04-08 DIAGNOSIS — J45909 Unspecified asthma, uncomplicated: Secondary | ICD-10-CM | POA: Diagnosis not present

## 2018-04-08 DIAGNOSIS — I1311 Hypertensive heart and chronic kidney disease without heart failure, with stage 5 chronic kidney disease, or end stage renal disease: Secondary | ICD-10-CM | POA: Diagnosis not present

## 2018-04-08 DIAGNOSIS — F329 Major depressive disorder, single episode, unspecified: Secondary | ICD-10-CM | POA: Diagnosis not present

## 2018-04-08 DIAGNOSIS — E1122 Type 2 diabetes mellitus with diabetic chronic kidney disease: Secondary | ICD-10-CM | POA: Diagnosis not present

## 2018-04-08 DIAGNOSIS — Z794 Long term (current) use of insulin: Secondary | ICD-10-CM | POA: Diagnosis not present

## 2018-04-08 DIAGNOSIS — Z940 Kidney transplant status: Secondary | ICD-10-CM | POA: Diagnosis not present

## 2018-04-08 DIAGNOSIS — E785 Hyperlipidemia, unspecified: Secondary | ICD-10-CM | POA: Diagnosis not present

## 2018-04-08 DIAGNOSIS — Z955 Presence of coronary angioplasty implant and graft: Secondary | ICD-10-CM | POA: Diagnosis not present

## 2018-04-08 DIAGNOSIS — Z87891 Personal history of nicotine dependence: Secondary | ICD-10-CM | POA: Diagnosis not present

## 2018-04-08 DIAGNOSIS — N186 End stage renal disease: Secondary | ICD-10-CM | POA: Diagnosis not present

## 2018-04-08 DIAGNOSIS — Z951 Presence of aortocoronary bypass graft: Secondary | ICD-10-CM | POA: Diagnosis not present

## 2018-04-08 DIAGNOSIS — G4733 Obstructive sleep apnea (adult) (pediatric): Secondary | ICD-10-CM | POA: Diagnosis not present

## 2018-04-08 DIAGNOSIS — I2510 Atherosclerotic heart disease of native coronary artery without angina pectoris: Secondary | ICD-10-CM | POA: Diagnosis not present

## 2018-04-08 DIAGNOSIS — D631 Anemia in chronic kidney disease: Secondary | ICD-10-CM | POA: Diagnosis not present

## 2018-04-08 DIAGNOSIS — C44622 Squamous cell carcinoma of skin of right upper limb, including shoulder: Secondary | ICD-10-CM | POA: Diagnosis not present

## 2018-04-08 DIAGNOSIS — Z483 Aftercare following surgery for neoplasm: Secondary | ICD-10-CM | POA: Diagnosis not present

## 2018-04-08 DIAGNOSIS — F419 Anxiety disorder, unspecified: Secondary | ICD-10-CM | POA: Diagnosis not present

## 2018-04-08 DIAGNOSIS — Z79899 Other long term (current) drug therapy: Secondary | ICD-10-CM | POA: Diagnosis not present

## 2018-04-08 DIAGNOSIS — Z7902 Long term (current) use of antithrombotics/antiplatelets: Secondary | ICD-10-CM | POA: Diagnosis not present

## 2018-04-08 DIAGNOSIS — M797 Fibromyalgia: Secondary | ICD-10-CM | POA: Diagnosis not present

## 2018-04-08 DIAGNOSIS — I252 Old myocardial infarction: Secondary | ICD-10-CM | POA: Diagnosis not present

## 2018-04-14 ENCOUNTER — Ambulatory Visit: Payer: Medicare Other | Admitting: Internal Medicine

## 2018-04-19 ENCOUNTER — Inpatient Hospital Stay (HOSPITAL_COMMUNITY)
Admission: EM | Admit: 2018-04-19 | Discharge: 2018-04-26 | DRG: 287 | Disposition: A | Discharge status: Home / Self Care | Payer: Medicare Other | Attending: Internal Medicine | Admitting: Internal Medicine

## 2018-04-19 ENCOUNTER — Emergency Department (HOSPITAL_COMMUNITY): Payer: Medicare Other

## 2018-04-19 ENCOUNTER — Encounter (HOSPITAL_COMMUNITY): Payer: Self-pay | Admitting: Emergency Medicine

## 2018-04-19 DIAGNOSIS — Z9049 Acquired absence of other specified parts of digestive tract: Secondary | ICD-10-CM

## 2018-04-19 DIAGNOSIS — Z940 Kidney transplant status: Secondary | ICD-10-CM

## 2018-04-19 DIAGNOSIS — R079 Chest pain, unspecified: Secondary | ICD-10-CM | POA: Diagnosis not present

## 2018-04-19 DIAGNOSIS — Z91048 Other nonmedicinal substance allergy status: Secondary | ICD-10-CM

## 2018-04-19 DIAGNOSIS — Z6841 Body Mass Index (BMI) 40.0 and over, adult: Secondary | ICD-10-CM | POA: Diagnosis not present

## 2018-04-19 DIAGNOSIS — Z79899 Other long term (current) drug therapy: Secondary | ICD-10-CM

## 2018-04-19 DIAGNOSIS — Z8614 Personal history of Methicillin resistant Staphylococcus aureus infection: Secondary | ICD-10-CM

## 2018-04-19 DIAGNOSIS — J309 Allergic rhinitis, unspecified: Secondary | ICD-10-CM | POA: Diagnosis present

## 2018-04-19 DIAGNOSIS — M069 Rheumatoid arthritis, unspecified: Secondary | ICD-10-CM | POA: Diagnosis present

## 2018-04-19 DIAGNOSIS — E1142 Type 2 diabetes mellitus with diabetic polyneuropathy: Secondary | ICD-10-CM | POA: Diagnosis present

## 2018-04-19 DIAGNOSIS — Z9884 Bariatric surgery status: Secondary | ICD-10-CM

## 2018-04-19 DIAGNOSIS — Z23 Encounter for immunization: Secondary | ICD-10-CM | POA: Diagnosis not present

## 2018-04-19 DIAGNOSIS — G8929 Other chronic pain: Secondary | ICD-10-CM | POA: Diagnosis present

## 2018-04-19 DIAGNOSIS — E1151 Type 2 diabetes mellitus with diabetic peripheral angiopathy without gangrene: Secondary | ICD-10-CM | POA: Diagnosis present

## 2018-04-19 DIAGNOSIS — Z7951 Long term (current) use of inhaled steroids: Secondary | ICD-10-CM

## 2018-04-19 DIAGNOSIS — F419 Anxiety disorder, unspecified: Secondary | ICD-10-CM | POA: Diagnosis not present

## 2018-04-19 DIAGNOSIS — I152 Hypertension secondary to endocrine disorders: Secondary | ICD-10-CM | POA: Diagnosis present

## 2018-04-19 DIAGNOSIS — E1159 Type 2 diabetes mellitus with other circulatory complications: Secondary | ICD-10-CM | POA: Diagnosis present

## 2018-04-19 DIAGNOSIS — Z87891 Personal history of nicotine dependence: Secondary | ICD-10-CM

## 2018-04-19 DIAGNOSIS — I10 Essential (primary) hypertension: Secondary | ICD-10-CM | POA: Diagnosis not present

## 2018-04-19 DIAGNOSIS — Z882 Allergy status to sulfonamides status: Secondary | ICD-10-CM

## 2018-04-19 DIAGNOSIS — Z955 Presence of coronary angioplasty implant and graft: Secondary | ICD-10-CM

## 2018-04-19 DIAGNOSIS — Z881 Allergy status to other antibiotic agents status: Secondary | ICD-10-CM

## 2018-04-19 DIAGNOSIS — J449 Chronic obstructive pulmonary disease, unspecified: Secondary | ICD-10-CM | POA: Diagnosis present

## 2018-04-19 DIAGNOSIS — E785 Hyperlipidemia, unspecified: Secondary | ICD-10-CM | POA: Diagnosis present

## 2018-04-19 DIAGNOSIS — Z888 Allergy status to other drugs, medicaments and biological substances status: Secondary | ICD-10-CM

## 2018-04-19 DIAGNOSIS — M797 Fibromyalgia: Secondary | ICD-10-CM | POA: Diagnosis present

## 2018-04-19 DIAGNOSIS — Z885 Allergy status to narcotic agent status: Secondary | ICD-10-CM

## 2018-04-19 DIAGNOSIS — E1169 Type 2 diabetes mellitus with other specified complication: Secondary | ICD-10-CM | POA: Diagnosis present

## 2018-04-19 DIAGNOSIS — Z8249 Family history of ischemic heart disease and other diseases of the circulatory system: Secondary | ICD-10-CM

## 2018-04-19 DIAGNOSIS — Z7952 Long term (current) use of systemic steroids: Secondary | ICD-10-CM

## 2018-04-19 DIAGNOSIS — Z794 Long term (current) use of insulin: Secondary | ICD-10-CM

## 2018-04-19 DIAGNOSIS — R0602 Shortness of breath: Secondary | ICD-10-CM | POA: Diagnosis not present

## 2018-04-19 DIAGNOSIS — I2582 Chronic total occlusion of coronary artery: Secondary | ICD-10-CM | POA: Diagnosis present

## 2018-04-19 DIAGNOSIS — Z951 Presence of aortocoronary bypass graft: Secondary | ICD-10-CM | POA: Diagnosis not present

## 2018-04-19 DIAGNOSIS — I25119 Atherosclerotic heart disease of native coronary artery with unspecified angina pectoris: Secondary | ICD-10-CM | POA: Diagnosis present

## 2018-04-19 DIAGNOSIS — J45909 Unspecified asthma, uncomplicated: Secondary | ICD-10-CM | POA: Diagnosis present

## 2018-04-19 DIAGNOSIS — Z8673 Personal history of transient ischemic attack (TIA), and cerebral infarction without residual deficits: Secondary | ICD-10-CM

## 2018-04-19 DIAGNOSIS — R0789 Other chest pain: Secondary | ICD-10-CM | POA: Diagnosis not present

## 2018-04-19 DIAGNOSIS — I447 Left bundle-branch block, unspecified: Secondary | ICD-10-CM | POA: Diagnosis not present

## 2018-04-19 DIAGNOSIS — E669 Obesity, unspecified: Secondary | ICD-10-CM | POA: Diagnosis present

## 2018-04-19 DIAGNOSIS — I252 Old myocardial infarction: Secondary | ICD-10-CM

## 2018-04-19 DIAGNOSIS — I480 Paroxysmal atrial fibrillation: Secondary | ICD-10-CM | POA: Diagnosis not present

## 2018-04-19 DIAGNOSIS — F418 Other specified anxiety disorders: Secondary | ICD-10-CM | POA: Diagnosis present

## 2018-04-19 DIAGNOSIS — K219 Gastro-esophageal reflux disease without esophagitis: Secondary | ICD-10-CM | POA: Diagnosis present

## 2018-04-19 DIAGNOSIS — G4730 Sleep apnea, unspecified: Secondary | ICD-10-CM | POA: Diagnosis present

## 2018-04-19 DIAGNOSIS — I4892 Unspecified atrial flutter: Secondary | ICD-10-CM | POA: Diagnosis present

## 2018-04-19 DIAGNOSIS — R0902 Hypoxemia: Secondary | ICD-10-CM | POA: Diagnosis not present

## 2018-04-19 DIAGNOSIS — Z7902 Long term (current) use of antithrombotics/antiplatelets: Secondary | ICD-10-CM

## 2018-04-19 DIAGNOSIS — Z85828 Personal history of other malignant neoplasm of skin: Secondary | ICD-10-CM

## 2018-04-19 DIAGNOSIS — G4733 Obstructive sleep apnea (adult) (pediatric): Secondary | ICD-10-CM | POA: Diagnosis present

## 2018-04-19 LAB — CBC
HCT: 35.7 % — ABNORMAL LOW (ref 36.0–46.0)
Hemoglobin: 11.2 g/dL — ABNORMAL LOW (ref 12.0–15.0)
MCH: 31 pg (ref 26.0–34.0)
MCHC: 31.4 g/dL (ref 30.0–36.0)
MCV: 98.9 fL (ref 78.0–100.0)
PLATELETS: 223 10*3/uL (ref 150–400)
RBC: 3.61 MIL/uL — AB (ref 3.87–5.11)
RDW: 14.6 % (ref 11.5–15.5)
WBC: 5.5 10*3/uL (ref 4.0–10.5)

## 2018-04-19 LAB — BASIC METABOLIC PANEL
Anion gap: 11 (ref 5–15)
BUN: 16 mg/dL (ref 6–20)
CALCIUM: 9 mg/dL (ref 8.9–10.3)
CO2: 22 mmol/L (ref 22–32)
CREATININE: 0.92 mg/dL (ref 0.44–1.00)
Chloride: 106 mmol/L (ref 98–111)
GFR calc non Af Amer: >60 mL/min (ref >60)
Glucose, Bld: 218 mg/dL — ABNORMAL HIGH (ref 70–99)
Potassium: 4 mmol/L (ref 3.5–5.1)
SODIUM: 139 mmol/L (ref 135–145)

## 2018-04-19 LAB — I-STAT TROPONIN, ED: TROPONIN I, POC: 0.02 ng/mL (ref 0.00–0.08)

## 2018-04-19 LAB — TROPONIN I: TROPONIN I: 0.03 ng/mL — AB (ref <0.03)

## 2018-04-19 LAB — CBG MONITORING, ED: GLUCOSE-CAPILLARY: 205 mg/dL — AB (ref 70–99)

## 2018-04-19 LAB — GLUCOSE, CAPILLARY
GLUCOSE-CAPILLARY: 190 mg/dL — AB (ref 70–99)
Glucose-Capillary: 152 mg/dL — ABNORMAL HIGH (ref 70–99)

## 2018-04-19 MED ORDER — ERYTHROMYCIN BASE 250 MG PO TABS
500.0000 mg | ORAL_TABLET | Freq: Every day | ORAL | Status: DC
  Filled 2018-04-19: qty 2

## 2018-04-19 MED ORDER — ONDANSETRON HCL 4 MG/2ML IJ SOLN: 4.0000 mg | Freq: Four times a day (QID) | INTRAMUSCULAR | Status: DC | PRN

## 2018-04-19 MED ORDER — DILTIAZEM HCL ER COATED BEADS 120 MG PO CP24
120.0000 mg | ORAL_CAPSULE | Freq: Every day | ORAL | Status: DC
  Administered 2018-04-20 – 2018-04-26 (×7): 120 mg via ORAL
  Filled 2018-04-19 (×7): qty 1

## 2018-04-19 MED ORDER — AZATHIOPRINE 50 MG PO TABS
125.0000 mg | ORAL_TABLET | Freq: Every evening | ORAL | Status: DC
  Administered 2018-04-20 – 2018-04-25 (×5): 125 mg via ORAL
  Filled 2018-04-19 (×9): qty 3

## 2018-04-19 MED ORDER — LORAZEPAM 1 MG PO TABS
1.0000 mg | ORAL_TABLET | Freq: Every morning | ORAL | Status: DC
  Administered 2018-04-20 – 2018-04-26 (×7): 1 mg via ORAL
  Filled 2018-04-19 (×7): qty 1

## 2018-04-19 MED ORDER — DULOXETINE HCL 60 MG PO CPEP
60.0000 mg | ORAL_CAPSULE | Freq: Every morning | ORAL | Status: DC
  Administered 2018-04-20 – 2018-04-26 (×7): 60 mg via ORAL
  Filled 2018-04-19 (×7): qty 1

## 2018-04-19 MED ORDER — DILTIAZEM HCL-DEXTROSE 100-5 MG/100ML-% IV SOLN (PREMIX)
5.0000 mg/h | INTRAVENOUS | Status: DC
  Filled 2018-04-19: qty 100

## 2018-04-19 MED ORDER — PANTOPRAZOLE SODIUM 40 MG PO TBEC
40.0000 mg | DELAYED_RELEASE_TABLET | Freq: Every day | ORAL | Status: DC
  Administered 2018-04-20 – 2018-04-26 (×7): 40 mg via ORAL
  Filled 2018-04-19 (×7): qty 1

## 2018-04-19 MED ORDER — DILTIAZEM LOAD VIA INFUSION
10.0000 mg | Freq: Once | INTRAVENOUS | Status: AC
  Administered 2018-04-19: 10 mg via INTRAVENOUS
  Filled 2018-04-19: qty 10

## 2018-04-19 MED ORDER — NITROGLYCERIN 0.4 MG SL SUBL: 0.4000 mg | SUBLINGUAL_TABLET | SUBLINGUAL | Status: DC | PRN

## 2018-04-19 MED ORDER — CYCLOBENZAPRINE HCL 10 MG PO TABS
10.0000 mg | ORAL_TABLET | Freq: Two times a day (BID) | ORAL | Status: DC
  Administered 2018-04-20 – 2018-04-26 (×14): 10 mg via ORAL
  Filled 2018-04-19 (×14): qty 1

## 2018-04-19 MED ORDER — FENTANYL CITRATE (PF) 100 MCG/2ML IJ SOLN
50.0000 ug | Freq: Once | INTRAMUSCULAR | Status: AC
  Administered 2018-04-19: 50 ug via INTRAVENOUS
  Filled 2018-04-19: qty 2

## 2018-04-19 MED ORDER — CARBAMAZEPINE 200 MG PO TABS
200.0000 mg | ORAL_TABLET | Freq: Two times a day (BID) | ORAL | Status: DC
  Administered 2018-04-20 – 2018-04-26 (×14): 200 mg via ORAL
  Filled 2018-04-19 (×14): qty 1

## 2018-04-19 MED ORDER — INSULIN ASPART 100 UNIT/ML ~~LOC~~ SOLN
0.0000 [IU] | SUBCUTANEOUS | Status: DC
  Administered 2018-04-20: 3 [IU] via SUBCUTANEOUS
  Administered 2018-04-20: 2 [IU] via SUBCUTANEOUS
  Administered 2018-04-20: 1 [IU] via SUBCUTANEOUS
  Administered 2018-04-20 (×2): 2 [IU] via SUBCUTANEOUS
  Administered 2018-04-21: 1 [IU] via SUBCUTANEOUS
  Administered 2018-04-21: 2 [IU] via SUBCUTANEOUS
  Administered 2018-04-22: 01:00:00 3 [IU] via SUBCUTANEOUS

## 2018-04-19 MED ORDER — ROSUVASTATIN CALCIUM 20 MG PO TABS
20.0000 mg | ORAL_TABLET | Freq: Every day | ORAL | Status: DC
  Administered 2018-04-20 – 2018-04-25 (×6): 20 mg via ORAL
  Filled 2018-04-19 (×6): qty 1

## 2018-04-19 MED ORDER — INSULIN GLARGINE 100 UNIT/ML ~~LOC~~ SOLN
8.0000 [IU] | Freq: Every day | SUBCUTANEOUS | Status: DC
  Administered 2018-04-20 – 2018-04-25 (×5): 8 [IU] via SUBCUTANEOUS
  Filled 2018-04-19 (×7): qty 0.08

## 2018-04-19 MED ORDER — ALBUTEROL SULFATE HFA 108 (90 BASE) MCG/ACT IN AERS: 2.0000 | INHALATION_SPRAY | Freq: Four times a day (QID) | RESPIRATORY_TRACT | Status: DC | PRN

## 2018-04-19 MED ORDER — HEPARIN BOLUS VIA INFUSION
4000.0000 [IU] | Freq: Once | INTRAVENOUS | Status: AC
  Administered 2018-04-20: 4000 [IU] via INTRAVENOUS
  Filled 2018-04-19: qty 4000

## 2018-04-19 MED ORDER — ALBUTEROL SULFATE (2.5 MG/3ML) 0.083% IN NEBU
2.5000 mg | INHALATION_SOLUTION | Freq: Four times a day (QID) | RESPIRATORY_TRACT | Status: DC | PRN
  Filled 2018-04-19: qty 3

## 2018-04-19 MED ORDER — ACETAMINOPHEN 325 MG PO TABS
650.0000 mg | ORAL_TABLET | ORAL | Status: DC | PRN
  Administered 2018-04-21 – 2018-04-26 (×8): 650 mg via ORAL
  Filled 2018-04-19 (×8): qty 2

## 2018-04-19 MED ORDER — SERTRALINE HCL 100 MG PO TABS
100.0000 mg | ORAL_TABLET | Freq: Every morning | ORAL | Status: DC
  Administered 2018-04-20 – 2018-04-26 (×7): 100 mg via ORAL
  Filled 2018-04-19 (×3): qty 1
  Filled 2018-04-19 (×2): qty 2
  Filled 2018-04-19 (×2): qty 1

## 2018-04-19 MED ORDER — OXYCODONE HCL 5 MG PO TABS
5.0000 mg | ORAL_TABLET | Freq: Three times a day (TID) | ORAL | Status: DC | PRN
  Administered 2018-04-20 – 2018-04-26 (×12): 5 mg via ORAL
  Filled 2018-04-19 (×12): qty 1

## 2018-04-19 MED ORDER — ENOXAPARIN SODIUM 40 MG/0.4ML ~~LOC~~ SOLN: 40.0000 mg | SUBCUTANEOUS | Status: DC

## 2018-04-19 MED ORDER — MONTELUKAST SODIUM 10 MG PO TABS
10.0000 mg | ORAL_TABLET | Freq: Every day | ORAL | Status: DC
  Administered 2018-04-20 – 2018-04-25 (×6): 10 mg via ORAL
  Filled 2018-04-19 (×6): qty 1

## 2018-04-19 MED ORDER — MOMETASONE FURO-FORMOTEROL FUM 200-5 MCG/ACT IN AERO
2.0000 | INHALATION_SPRAY | Freq: Two times a day (BID) | RESPIRATORY_TRACT | Status: DC
  Administered 2018-04-23 – 2018-04-26 (×6): 2 via RESPIRATORY_TRACT
  Filled 2018-04-19 (×2): qty 8.8

## 2018-04-19 MED ORDER — HEPARIN (PORCINE) IN NACL 100-0.45 UNIT/ML-% IJ SOLN
950.0000 [IU]/h | INTRAMUSCULAR | Status: DC
  Administered 2018-04-20: 950 [IU]/h via INTRAVENOUS
  Administered 2018-04-20: 1050 [IU]/h via INTRAVENOUS
  Filled 2018-04-19 (×2): qty 250

## 2018-04-19 MED ORDER — METOPROLOL TARTRATE 25 MG PO TABS
25.0000 mg | ORAL_TABLET | Freq: Two times a day (BID) | ORAL | Status: DC
  Administered 2018-04-20 – 2018-04-26 (×14): 25 mg via ORAL
  Filled 2018-04-19 (×14): qty 1

## 2018-04-19 MED ORDER — FAMOTIDINE 20 MG PO TABS
20.0000 mg | ORAL_TABLET | Freq: Every day | ORAL | Status: DC
  Administered 2018-04-20 – 2018-04-26 (×7): 20 mg via ORAL
  Filled 2018-04-19 (×7): qty 1

## 2018-04-19 MED ORDER — PREDNISONE 5 MG PO TABS
5.0000 mg | ORAL_TABLET | Freq: Every day | ORAL | Status: DC
  Administered 2018-04-20 – 2018-04-26 (×7): 5 mg via ORAL
  Filled 2018-04-19 (×7): qty 1

## 2018-04-19 MED ORDER — CLOPIDOGREL BISULFATE 75 MG PO TABS
75.0000 mg | ORAL_TABLET | Freq: Every day | ORAL | Status: DC
  Administered 2018-04-20 – 2018-04-21 (×2): 75 mg via ORAL
  Filled 2018-04-19 (×3): qty 1

## 2018-04-19 MED ORDER — GUAIFENESIN ER 600 MG PO TB12
600.0000 mg | ORAL_TABLET | Freq: Two times a day (BID) | ORAL | Status: DC | PRN
  Administered 2018-04-20 – 2018-04-23 (×4): 600 mg via ORAL
  Filled 2018-04-19 (×5): qty 1

## 2018-04-19 MED ORDER — CALCITRIOL 0.25 MCG PO CAPS
0.2500 ug | ORAL_CAPSULE | ORAL | Status: DC
  Administered 2018-04-20 – 2018-04-26 (×3): 0.25 ug via ORAL
  Filled 2018-04-19 (×3): qty 1

## 2018-04-19 NOTE — Progress Notes (Signed)
ANTICOAGULATION CONSULT NOTE - Initial Consult  Pharmacy Consult for heparin Indication: atrial fibrillation  Allergies  Allergen Reactions  . Tetracycline Hives  . Hydromorphone Hcl Nausea And Vomiting  . Niacin Other (See Comments)    Mouth blisters  . Niaspan [Niacin Er] Other (See Comments)    Mouth blisters  . Sulfa Antibiotics Nausea Only and Other (See Comments)    "Tears up stomach"  . Sulfonamide Derivatives Other (See Comments)    Reaction: per patient "tears her stomach up"  . Codeine Nausea And Vomiting  . Erythromycin Nausea And Vomiting  . Hydromorphone Hcl Nausea And Vomiting  . Morphine And Related Nausea And Vomiting  . Nalbuphine Nausea And Vomiting  . Sulfasalazine Nausea Only and Other (See Comments)    per patient "tears her stomach up", "Tears up stomach"  . Tape Rash and Other (See Comments)    No "plastic" tape," please    Patient Measurements: Height: 5\' 1"  (154.9 cm) Weight: 232 lb (105.2 kg) IBW/kg (Calculated) : 47.8 Heparin Dosing Weight: 73.4 kg  Vital Signs: Temp: 97.6 F (36.4 C) (09/03 2255) Temp Source: Oral (09/03 1753) BP: 137/100 (09/03 2255) Pulse Rate: 108 (09/03 2255)  Labs: Recent Labs    04/19/18 1844 04/19/18 2121  HGB 11.2*  --   HCT 35.7*  --   PLT 223  --   CREATININE 0.92  --   TROPONINI  --  0.03*    Estimated Creatinine Clearance: 72.7 mL/min (by C-G formula based on SCr of 0.92 mg/dL).   Medical History: Past Medical History:  Diagnosis Date  . Anemia   . Anxiety   . Bilateral carotid artery stenosis    Carotid duplex 09/5051: 9-76% LICA, 73-41% RICA, >93% RECA, f/u 1 yr suggested  . CAD (coronary artery disease) of bypass graft 5/01; 3/'02, 8/'03, 10/'04; 1/15   PCI x 5 to SVG-D1   . CAD in native artery 07/1993    3 Vessel Disease (LAD-D1 & RCA) -- CABG  . CAD S/P percutaneous coronary angioplasty    PCI to SVG-D1 insertion/native D1 x 4 = '01 -(S660 BMS 2.5 x 9 - insertion into D1; '02 - distal  overlap ACS Pixel 2.5 x 8  BMS; '03 distal/native ISR/Thrombosis - Pixel 2.5 x 13; '04 - ISR-  Taxus 2.5 x 20 (covered all);; 1/15 - mid SVG-D1 (50% distal ISR) - Promus P 2.75 x 20 -- 2.8 mm  . COPD mixed type ([place])    Followed by Dr. Lamonte Sakai "pulmonologist said no COPD"  . Depression   . Depression with anxiety   . Diabetes mellitus type 2 in obese ([place])   . Diarrhea    started after cholecystectomy and mass removed from intestine  . Dyslipidemia, goal LDL below 70    08/2012: TC 137, TG 200, HDL 32!, LDL 45; on statin (followed by Dr.Deterding)  . ESRD (end stage renal disease) ([place]) 1991   s/p Cadaveric Renal Transplant [facility] - Dr. Jimmy Footman)   . Family history of adverse reaction to anesthesia    mom's bp dropped during/after anesthesia  . Fibromyalgia   . GERD (gastroesophageal reflux disease)   . Glomerulonephritis, chronic, rapidly progressive 37  . 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 07/1993   Rescue PTCA of RCA -- referred for CABG.  . H/O: GI bleed   . Headache    migraines in the past  . History of kidney stones   . Hypertension associated with diabetes ([place])   .  MRSA (methicillin resistant staph aureus) culture positive   . Obesity    s/p Lap Band 04/2004- Port Replacement 10/09 & 2/10 for infection; Dr. Excell Seltzer.  . OSA (obstructive sleep apnea)    no longer on CPAP or home O2, states she doesn't need now after lap band  . PAD (peripheral artery disease) ([place]) 08/2013   LEA Dopplers to be read by Dr. Fletcher Anon  . PAF (paroxysmal atrial fibrillation) ([place]) 06/2014   Noted on CardioNet Monitor  - rate ~112; no recurrent symptoms. Nonionic regulation because of frequent GI procedures. Patient prefers Plavix  . Pneumonia   . Recurrent boils    Bilateral Groin  . Rheumatoid arthritis ([place])    Per Patient Report; associated with OA  . 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 Servando Snare: LIMA-LAD, SVG-bifurcatingD1, SVG-rPDA  . S/p cadaver renal transplant  [place]  . Stroke [facility]) 2012   "right eye stroke- half blind now"  . Unstable angina ([place]) 5/01; 3/'02, 8/'03, 10/'04; 1/15   x 5 occurences since Inf-Post STEMI in 1994    Medications:  Medications Prior to Admission  Medication Sig Dispense Refill Last Dose  . acetaminophen (TYLENOL) 500 MG tablet Take 1 tablet (500 mg total) by mouth every 6 (six) hours as needed for moderate pain or headache. (Patient taking differently: Take 1,000 mg by mouth every 6 (six) hours as needed for moderate pain or headache. ) 30 tablet 0 unk  . albuterol (VENTOLIN HFA) 108 (90 Base) MCG/ACT inhaler INHALE 2 PUFFS INTO THE LUNGS EVERY 6 (SIX) HOURS AS NEEDED FOR WHEEZING OR SHORTNESS OF BREATH. (Patient taking differently: Inhale 2 puffs into the lungs every 6 (six) hours as needed for wheezing or shortness of breath. ) 18 Inhaler 2 unk  . azaTHIOprine (IMURAN) 50 MG tablet Take 125 mg by mouth every evening.    04/19/2018 at Unknown time  . budesonide-formoterol (SYMBICORT) 160-4.5 MCG/ACT inhaler Inhale 2 puffs into the lungs 2 (two) times daily. 2 Inhaler 0 04/19/2018 at Unknown time  . calcitRIOL (ROCALTROL) 0.25 MCG capsule Take 0.25 mcg by mouth every 3 (three) days.    04/18/2018 at Unknown time  . carbamazepine (TEGRETOL) 200 MG tablet TAKE 200 MG BY MOUTH TWICE A DAY (Patient taking differently: Take 200 mg by mouth 2 (two) times daily. ) 60 tablet 2 04/19/2018 at Unknown time  . clopidogrel (PLAVIX) 75 MG tablet TAKE 1 TABLET BY MOUTH EVERY DAY (Patient taking differently: Take 75 mg by mouth daily. ) 90 tablet 0 04/19/2018 at 0800  . clotrimazole (MYCELEX) 10 MG troche Take 1 tablet (10 mg total) by mouth 5 (five) times daily. (Patient taking differently: Take 10 mg by mouth daily as needed (thrush). ) 70 tablet 0 unk  . cyclobenzaprine (FLEXERIL) 10 MG tablet TAKE 1 TABLET BY MOUTH 3 TIMES DAILY AS NEEDED FOR MUSCLE SPASMS (Patient taking differently: Take 10 mg by mouth 2 (two) times daily. ) 90 tablet 2 unk   . diltiazem (CARDIZEM CD) 120 MG 24 hr capsule TAKE 1 CAPSULE (120 MG TOTAL) BY MOUTH DAILY. NEED OV. (Patient taking differently: Take 120 mg by mouth daily. ) 30 capsule 0 04/19/2018 at Unknown time  . DULoxetine (CYMBALTA) 30 MG capsule TAKE 2 CAPSULES (60 MG TOTAL) BY MOUTH EVERY MORNING. 180 capsule 2 04/19/2018 at Unknown time  . erythromycin base (E-MYCIN) 500 MG tablet Take 500 mg by mouth daily.   04/19/2018 at Unknown time  . famotidine (PEPCID) 20 MG  tablet Take 20 mg by mouth daily.   04/19/2018 at Unknown time  . ferrous sulfate 325 (65 FE) MG tablet Take 1 tablet (325 mg total) by mouth 3 (three) times daily with meals. (Patient taking differently: Take 325 mg by mouth 2 (two) times daily. ) 90 tablet 0 04/19/2018 at Unknown time  . fluticasone (FLONASE) 50 MCG/ACT nasal spray Place 2 sprays into both nostrils 2 (two) times daily. 48 g 1 04/19/2018 at Unknown time  . glimepiride (AMARYL) 4 MG tablet Take 4 mg by mouth every morning.  6 04/19/2018 at Unknown time  . guaiFENesin (MUCINEX) 600 MG 12 hr tablet Take 1 tablet (600 mg total) by mouth 2 (two) times daily. (Patient taking differently: Take 600 mg by mouth 2 (two) times daily as needed for cough or to loosen phlegm. ) 30 tablet 0 unk  . Insulin Glargine (BASAGLAR KWIKPEN) 100 UNIT/ML SOPN Inject 12 Units into the skin daily.   04/19/2018 at Unknown time  . loperamide (IMODIUM A-D) 2 MG tablet Take 4 mg by mouth 2 (two) times daily.   04/19/2018 at Unknown time  . LORazepam (ATIVAN) 1 MG tablet Take 1 mg by mouth every morning.    04/19/2018 at Unknown time  . metFORMIN (GLUCOPHAGE) 500 MG tablet Take 500 mg by mouth 2 (two) times daily.  6 04/19/2018 at Unknown time  . metoprolol tartrate (LOPRESSOR) 25 MG tablet Take 1 tablet (25 mg total) by mouth 2 (two) times daily. 180 tablet 1 04/19/2018 at 0800  . montelukast (SINGULAIR) 10 MG tablet TAKE 1 TABLET BY MOUTH EVERY DAY 90 tablet 1 04/19/2018 at Unknown time  . nitroGLYCERIN (NITROSTAT) 0.4 MG SL  tablet Place 1 tablet (0.4 mg total) under the tongue every 5 (five) minutes as needed for chest pain. 25 tablet 6 unk  . NOVOLOG FLEXPEN 100 UNIT/ML FlexPen Inject 4 Units into the skin 2 (two) times daily as needed for high blood sugar.   6 04/18/2018 at Unknown time  . omeprazole (PRILOSEC) 40 MG capsule Take 40 mg by mouth every morning.    04/19/2018 at Unknown time  . oxyCODONE (OXY IR/ROXICODONE) 5 MG immediate release tablet Take 1 tablet (5 mg total) by mouth every 8 (eight) hours as needed for severe pain. 60 tablet 0 04/19/2018 at Unknown time  . predniSONE (DELTASONE) 5 MG tablet Take 5 mg by mouth daily with breakfast.   04/19/2018 at Unknown time  . promethazine (PHENERGAN) 25 MG tablet Take 1 tablet (25 mg total) by mouth every 6 (six) hours as needed for nausea or vomiting (or sleep). 15 tablet 0 04/18/2018 at Unknown time  . rosuvastatin (CRESTOR) 20 MG tablet Take 1 tablet (20 mg total) by mouth daily. 90 tablet 1 04/19/2018 at Unknown time  . sertraline (ZOLOFT) 100 MG tablet Take 100 mg by mouth every morning.   04/19/2018 at Unknown time    Assessment: 62 yo lady to start heparin for afib.  Hg 11.2, PTLC 223 Goal of Therapy:  Heparin level 0.3-0.7 units/ml Monitor platelets by anticoagulation protocol: Yes   Plan:  Heparin bolus 4000 units and drip at 1050 units/hr Check heparin level 8 hours after start Daily HL and CBC while on heparin Monitor for bleeding complications  Excell Seltzer [place] 04/19/2018,11:42 PM

## 2018-04-19 NOTE — ED Triage Notes (Signed)
Per EMS pt from home at 1600 chest pain on both sides and radiates to both sides of jaw, 324 mg aspirin, 1 expired nitro from 8/10 to 5/10. CABG 1994. Pt states she was nauseous and diaphoretic. 2 nitro given pain still 5/10.

## 2018-04-19 NOTE — ED Provider Notes (Signed)
[place] EMERGENCY DEPARTMENT Provider Note   CSN: [id number] Arrival date & time: 04/19/18  1730     History   Chief Complaint Chief Complaint  Patient presents with  . Chest Pain    HPI Katherine [name] is a 62 y.o. female.  Patient is a 62 year old female with a history of coronary artery disease status post bypass surgery and prior stent placement who presents with chest pain and palpitations.  She states about an hour prior to arrival she felt like her heart was racing.  She does have a history of A. fib.  She stated about 20 or 30 minutes following that she developed some tightness across her chest that radiated to her jaw.  She also had some shortness of breath.  No nausea or diaphoresis.  She was given aspirin and nitroglycerin per EMS.  She states her chest pain is resolved but she still has jaw pain.  She does not have the sensation that her heart is racing anymore.     Past Medical History:  Diagnosis Date  . Anemia   . Anxiety   . Bilateral carotid artery stenosis    Carotid duplex 03/3661: 9-47% LICA, 65-46% RICA, >50% RECA, f/u 1 yr suggested  . CAD (coronary artery disease) of bypass graft 5/01; 3/'02, 8/'03, 10/'04; 1/15   PCI x 5 to SVG-D1   . CAD in native artery 07/1993    3 Vessel Disease (LAD-D1 & RCA) -- CABG  . CAD S/P percutaneous coronary angioplasty    PCI to SVG-D1 insertion/native D1 x 4 = '01 -(S660 BMS 2.5 x 9 - insertion into D1; '02 - distal overlap ACS Pixel 2.5 x 8  BMS; '03 distal/native ISR/Thrombosis - Pixel 2.5 x 13; '04 - ISR-  Taxus 2.5 x 20 (covered all);; 1/15 - mid SVG-D1 (50% distal ISR) - Promus P 2.75 x 20 -- 2.8 mm  . COPD mixed type ([place])    Followed by Dr. Lamonte Sakai "pulmonologist said no COPD"  . Depression   . Depression with anxiety   . Diabetes mellitus type 2 in obese ([place])   . Diarrhea    started after cholecystectomy and mass removed from intestine  . Dyslipidemia, goal LDL below 70    08/2012: TC 137,  TG 200, HDL 32!, LDL 45; on statin (followed by Dr.Deterding)  . ESRD (end stage renal disease) ([place]) 1991   s/p Cadaveric Renal Transplant [facility] - Dr. Jimmy Footman)   . Family history of adverse reaction to anesthesia    mom's bp dropped during/after anesthesia  . Fibromyalgia   . GERD (gastroesophageal reflux disease)   . Glomerulonephritis, chronic, rapidly progressive 70  . 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 07/1993   Rescue PTCA of RCA -- referred for CABG.  . H/O: GI bleed   . Headache    migraines in the past  . History of kidney stones   . Hypertension associated with diabetes ([place])   . MRSA (methicillin resistant staph aureus) culture positive   . Obesity    s/p Lap Band 04/2004- Port Replacement 10/09 & 2/10 for infection; Dr. Excell Seltzer.  . OSA (obstructive sleep apnea)    no longer on CPAP or home O2, states she doesn't need now after lap band  . PAD (peripheral artery disease) ([place]) 08/2013   LEA Dopplers to be read by Dr. Fletcher Anon  . PAF (paroxysmal atrial fibrillation) ([place]) 06/2014   Noted on CardioNet Monitor  - rate ~112;  no recurrent symptoms. Nonionic regulation because of frequent GI procedures. Patient prefers Plavix  . Pneumonia   . Recurrent boils    Bilateral Groin  . Rheumatoid arthritis ([place])    Per Patient Report; associated with OA  . 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 Servando Snare: LIMA-LAD, SVG-bifurcatingD1, SVG-rPDA  . S/p cadaver renal transplant [place]  . Stroke [facility]) 2012   "right eye stroke- half blind now"  . Unstable angina ([place]) 5/01; 3/'02, 8/'03, 10/'04; 1/15   x 5 occurences since Inf-Post STEMI in 1994    Patient Active Problem List   Diagnosis Date Noted  . Chest pain at rest 04/19/2018  . Chronic pain 04/19/2018  . Chest pain 04/19/2018  . 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 Cellulitis of right thumb 01/28/2018  . S/P kidney transplant 01/28/2018  . Status post arthroscopy of knee  12/03/2017  . Primary osteoarthritis of right knee 08/18/2017  . Aortic valve sclerosis 01/28/2017  . 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 Spondylosis of lumbar region without myelopathy or radiculopathy 03/30/2016  . Type 2 diabetes mellitus with peripheral neuropathy ([place]) 03/30/2016  . PAF (paroxysmal atrial fibrillation) ([place]) 06/28/2014  . Hypertension associated with diabetes ([place])   . PAD (peripheral artery disease) ([place]) 08/17/2013  . Stenosis of right carotid artery without infarction 10/08/2012  . Dyslipidemia, goal LDL below 70 10/08/2012    Class: Diagnosis of  . Mitral annular calcification 10/08/2012  . Renal transplant disorder 06/12/2012  . Chronic allergic rhinitis 04/29/2011  . Sleep apnea 09/09/2007  . Extrinsic asthma 09/09/2007  . GERD 09/09/2007  . Morbid obesity - s/p Lap Band 9/'05 05/07/2004    Class: Diagnosis of  . S/P CABG (coronary artery bypass graft) x 3 09/07/1993    Class: History of    Past Surgical History:  Procedure Laterality Date  . CARDIAC CATHETERIZATION  5/'01, 3/'02, 8/'03, 10/'04; 1/'15   08/22/2013: LAD & RCA 100%; LIMA-LAD & SVG-rPDA patent; Cx-- OM1 60%, OM2 ostial ~50%; SVG-D1 - 80% mid, 50% distal ISR --PCI  . CATHETER REMOVAL    . CHOLECYSTECTOMY N/A 10/29/2014   Procedure: LAPAROSCOPIC CHOLECYSTECTOMY WITH INTRAOPERATIVE CHOLANGIOGRAM;  Surgeon: Excell Seltzer, MD;  Location: [facility] ORS;  Service: General;  Laterality: N/A;  . CORONARY ANGIOPLASTY  1994   x5  . CORONARY ARTERY BYPASS GRAFT  1995   LIMA-LAD, SVG-RPDA, SVG-D1  . ESOPHAGOGASTRODUODENOSCOPY N/A 10/15/2016   Procedure: ESOPHAGOGASTRODUODENOSCOPY (EGD);  Surgeon: Wilford Corner, MD;  Location: [facility] ENDOSCOPY;  Service: Endoscopy;  Laterality: N/A;  . I&D EXTREMITY Right 01/29/2018   Procedure: IRRIGATION AND DEBRIDEMENT THUMB;  Surgeon: Dayna Barker, MD;  Location: [facility];  Service: Plastics;  Laterality: Right;  . INCISE AND DRAIN ABCESS    . KIDNEY TRANSPLANT  1991  . KNEE  ARTHROSCOPY WITH LATERAL MENISECTOMY Left 12/03/2017   Procedure: LEFT KNEE ARTHROSCOPY WITH LATERAL MENISECTOMY;  Surgeon: Earlie Server, MD;  Location: [facility];  Service: Orthopedics;  Laterality: Left;  . LAPAROSCOPIC GASTRIC BANDING  04/2004; 10/'09, 2/'10   Port Replacement x 2  . LEFT HEART CATHETERIZATION WITH CORONARY/GRAFT ANGIOGRAM N/A 08/23/2013   Procedure: LEFT HEART CATHETERIZATION WITH Beatrix Fetters;  Surgeon: Wellington Hampshire, MD;  Location: [place] CATH LAB;  Service: Cardiovascular;  Laterality: N/A;  . Lower Extremity Arterial Dopplers  08/2013   ABI: R 0.96, L 1.04  . MULTIPLE TOOTH EXTRACTIONS  age 48  . NM MYOVIEW LTD  03/2016   EF 62%.  LOW RISK. C/W prior MI - no Ischemia. Apical hypokinesis.  [name] Kitchen PERCUTANEOUS CORONARY STENT INTERVENTION (PCI-S)  5/'01, 3/'02, 8/'03, 10/'04;   '01 - S660 BMS 2.5 x 9 - dSVG-D1 into D1; '02- post-stent stenosis - 2.5 x 8 Pixel BMS; '8\03: ISR/Thrombosis into native D1 - AngioJet, 2.5 x 13 Pixel; '04 - ISR 95% - covered stented area with Taxus DES 2.5 mm x 20 (2.88)  . PERCUTANEOUS CORONARY STENT INTERVENTION (PCI-S)  08/23/2013   Procedure: PERCUTANEOUS CORONARY STENT INTERVENTION (PCI-S);  Surgeon: Wellington Hampshire, MD;  Location: [facility] CATH LAB;  Service: Cardiovascular;;mid SVG-D1 80%; distal stent ~50% ISR; Promus [place] DES 2.75 mm xc 20 mm (2.8 mm)  . PORT-A-CATH REMOVAL     kidney  . TRANSTHORACIC ECHOCARDIOGRAM  02/2016   EF 55-60%. Septal dyssynergy from CABG GR 2 DD. Aortic valve is trileaflet the functional bicuspid with sclerosis but not yet notable for stenosis stenosis.; no Significant change  . TUBAL LIGATION    . wrist fistula repair Left    dialysis for one year     OB History   None      Home Medications    Prior to Admission medications   Medication Sig Start Date End Date Taking? Authorizing Provider  acetaminophen (TYLENOL) 500 MG tablet Take 1 tablet (500 mg total) by mouth every 6 (six) hours as needed for  moderate pain or headache. Patient taking differently: Take 1,000 mg by mouth every 6 (six) hours as needed for moderate pain or headache.  10/17/16  Yes Arrien, Jimmy Picket, MD  albuterol (VENTOLIN HFA) 108 (90 Base) MCG/ACT inhaler INHALE 2 PUFFS INTO THE LUNGS EVERY 6 (SIX) HOURS AS NEEDED FOR WHEEZING OR SHORTNESS OF BREATH. Patient taking differently: Inhale 2 puffs into the lungs every 6 (six) hours as needed for wheezing or shortness of breath.  06/14/17  Yes Collene Gobble, MD  azaTHIOprine (IMURAN) 50 MG tablet Take 125 mg by mouth every evening.    Yes [provider]  budesonide-formoterol (SYMBICORT) 160-4.5 MCG/ACT inhaler Inhale 2 puffs into the lungs 2 (two) times daily. 10/07/17  Yes Collene Gobble, MD  calcitRIOL (ROCALTROL) 0.25 MCG capsule Take 0.25 mcg by mouth every 3 (three) days.    Yes [provider]  carbamazepine (TEGRETOL) 200 MG tablet TAKE 200 MG BY MOUTH TWICE A DAY Patient taking differently: Take 200 mg by mouth 2 (two) times daily.  03/11/18  Yes Bayard Hugger, NP  clopidogrel (PLAVIX) 75 MG tablet TAKE 1 TABLET BY MOUTH EVERY DAY Patient taking differently: Take 75 mg by mouth daily.  02/14/18  Yes [name], MD  clotrimazole [facility]) 10 MG troche Take 1 tablet (10 mg total) by mouth 5 (five) times daily. Patient taking differently: Take 10 mg by mouth daily as needed (thrush).  10/21/16  Yes Domenic Polite, MD  cyclobenzaprine (FLEXERIL) 10 MG tablet TAKE 1 TABLET BY MOUTH 3 TIMES DAILY AS NEEDED FOR MUSCLE SPASMS Patient taking differently: Take 10 mg by mouth 2 (two) times daily.  10/19/17  Yes Meredith Staggers, MD  diltiazem (CARDIZEM CD) 120 MG 24 hr capsule TAKE 1 CAPSULE (120 MG TOTAL) BY MOUTH DAILY. NEED OV. Patient taking differently: Take 120 mg by mouth daily.  02/22/17  Yes [name], MD  DULoxetine (CYMBALTA) 30 MG capsule TAKE 2 CAPSULES (60 MG TOTAL) BY MOUTH EVERY MORNING. 01/26/18  Yes Meredith Staggers, MD    erythromycin base (E-MYCIN) 500 MG tablet Take  500 mg by mouth daily.   Yes [provider]  famotidine (PEPCID) 20 MG tablet Take 20 mg by mouth daily. 05/19/17  Yes [provider]  ferrous sulfate 325 (65 FE) MG tablet Take 1 tablet (325 mg total) by mouth 3 (three) times daily with meals. Patient taking differently: Take 325 mg by mouth 2 (two) times daily.  10/17/16  Yes Arrien, Jimmy Picket, MD  fluticasone [facility]) 50 MCG/ACT nasal spray Place 2 sprays into both nostrils 2 (two) times daily. 12/13/17  Yes Collene Gobble, MD  glimepiride (AMARYL) 4 MG tablet Take 4 mg by mouth every morning. 04/20/16  Yes [provider]  guaiFENesin (MUCINEX) 600 MG 12 hr tablet Take 1 tablet (600 mg total) by mouth 2 (two) times daily. Patient taking differently: Take 600 mg by mouth 2 (two) times daily as needed for cough or to loosen phlegm.  02/02/18  Yes Bonnielee Haff, MD  Insulin Glargine (BASAGLAR KWIKPEN) 100 UNIT/ML SOPN Inject 12 Units into the skin daily.   Yes [provider]  loperamide (IMODIUM A-D) 2 MG tablet Take 4 mg by mouth 2 (two) times daily.   Yes [provider]  LORazepam (ATIVAN) 1 MG tablet Take 1 mg by mouth every morning.    Yes [provider]  metFORMIN (GLUCOPHAGE) 500 MG tablet Take 500 mg by mouth 2 (two) times daily. 10/03/16  Yes [provider]  metoprolol tartrate (LOPRESSOR) 25 MG tablet Take 1 tablet (25 mg total) by mouth 2 (two) times daily. 03/17/18  Yes [name], MD  montelukast (SINGULAIR) 10 MG tablet TAKE 1 TABLET BY MOUTH EVERY DAY 01/11/18  Yes Byrum, Rose Fillers, MD  nitroGLYCERIN (NITROSTAT) 0.4 MG SL tablet Place 1 tablet (0.4 mg total) under the tongue every 5 (five) minutes as needed for chest pain. 09/11/13  Yes [name], MD  NOVOLOG FLEXPEN 100 UNIT/ML FlexPen Inject 4 Units into the skin 2 (two) times daily as needed for high blood sugar.  12/13/17  Yes [provider]   omeprazole (PRILOSEC) 40 MG capsule Take 40 mg by mouth every morning.    Yes [provider]  oxyCODONE (OXY IR/ROXICODONE) 5 MG immediate release tablet Take 1 tablet (5 mg total) by mouth every 8 (eight) hours as needed for severe pain. 02/14/18  Yes Bayard Hugger, NP  predniSONE (DELTASONE) 5 MG tablet Take 5 mg by mouth daily with breakfast.   Yes [provider]  promethazine (PHENERGAN) 25 MG tablet Take 1 tablet (25 mg total) by mouth every 6 (six) hours as needed for nausea or vomiting (or sleep). 02/03/18  Yes Deno Etienne, DO  rosuvastatin (CRESTOR) 20 MG tablet Take 1 tablet (20 mg total) by mouth daily. 10/25/17  Yes [name], MD  sertraline (ZOLOFT) 100 MG tablet Take 100 mg by mouth every morning.   Yes [provider]  potassium chloride SA (K-DUR,KLOR-CON) 20 MEQ tablet Take 1 tablet (20 mEq total) by mouth daily. Patient not taking: Reported on 04/19/2018 02/03/18   Lajean Saver, MD    Family History Family History  Problem Relation Age of Onset  . Cancer Mother        liver  . Heart disease Father   . Cancer Father        colon  . Arrhythmia Brother        Atrial Fibrillation  . Arrhythmia Paternal Aunt        Atrial Fibrillation  Social History Social History   Tobacco Use  . Smoking status: Former Smoker    Packs/day: 1.00    Years: 30.00    Pack years: 30.00    Types: Cigarettes    Last attempt to quit: 08/17/2002    Years since quitting: 15.6  . Smokeless tobacco: Never Used  Substance Use Topics  . Alcohol use: No  . Drug use: No     Allergies   Tetracycline; Hydromorphone hcl; Niacin; Niaspan [niacin er]; Sulfa antibiotics; Sulfonamide derivatives; Codeine; Erythromycin; Hydromorphone hcl; Morphine and related; Nalbuphine; Sulfasalazine; and Tape   Review of Systems Review of Systems  Constitutional: Negative for chills, diaphoresis, fatigue and fever.  HENT: Negative for congestion, rhinorrhea and sneezing.    Eyes: Negative.   Respiratory: Positive for shortness of breath. Negative for cough and chest tightness.   Cardiovascular: Positive for chest pain and palpitations. Negative for leg swelling.  Gastrointestinal: Negative for abdominal pain, blood in stool, diarrhea, nausea and vomiting.  Genitourinary: Negative for difficulty urinating, flank pain, frequency and hematuria.  Musculoskeletal: Negative for arthralgias and back pain.  Skin: Negative for rash.  Neurological: Negative for dizziness, speech difficulty, weakness, numbness and headaches.     Physical Exam Updated Vital Signs BP 110/78   Pulse 85   Temp 98.2 F (36.8 C) (Oral)   Resp 20   Ht 5\' 1"  (1.549 m)   Wt 105.2 kg   SpO2 95%   BMI 43.84 kg/m   Physical Exam  Constitutional: She is oriented to person, place, and time. She appears well-developed and well-nourished.  HENT:  Head: Normocephalic and atraumatic.  Eyes: Pupils are equal, round, and reactive to light.  Neck: Normal range of motion. Neck supple.  Cardiovascular: Normal heart sounds. Tachycardia present.  Pulmonary/Chest: Effort normal and breath sounds normal. No respiratory distress. She has no wheezes. She has no rales. She exhibits no tenderness.  Abdominal: Soft. Bowel sounds are normal. There is no tenderness. There is no rebound and no guarding.  Musculoskeletal: Normal range of motion. She exhibits no edema.  Lymphadenopathy:    She has no cervical adenopathy.  Neurological: She is alert and oriented to person, place, and time.  Skin: Skin is warm and dry. No rash noted.  Psychiatric: She has a normal mood and affect.     ED Treatments / Results  Labs (all labs ordered are listed, but only abnormal results are displayed) Labs Reviewed  BASIC METABOLIC PANEL - Abnormal; Notable for the following components:      Result Value   Glucose, Bld 218 (*)    All other components within normal limits  CBC - Abnormal; Notable for the following  components:   RBC 3.61 (*)    Hemoglobin 11.2 (*)    HCT 35.7 (*)    All other components within normal limits  CBG MONITORING, ED - Abnormal; Notable for the following components:   Glucose-Capillary 205 (*)    All other components within normal limits  TROPONIN I  I-STAT TROPONIN, ED    EKG EKG Interpretation  Date/Time:  Tuesday April 19 2018 19:50:11 EDT Ventricular Rate:  91 PR Interval:    QRS Duration: 83 QT Interval:  387 QTC Calculation: 477 R Axis:   -41 Text Interpretation:  Atrial fibrillation Abnormal R-wave progression, early transition Abnormal T, consider ischemia, lateral leads Baseline wander in lead(s) V3 Confirmed by Malvin Johns [id number]) on 04/19/2018 9:19:48 PM   Radiology Dg Chest 2 View  Result Date: 04/19/2018 CLINICAL  DATA:  Chest pain. EXAM: CHEST - 2 VIEW COMPARISON:  Chest x-ray dated February 03, 2018. FINDINGS: The patient is rotated to the left. Stable cardiomegaly status post CABG. Normal pulmonary vascularity. No focal consolidation, pleural effusion, or pneumothorax. No acute osseous abnormality. Multiple broken median sternotomy wires are unchanged. IMPRESSION: Stable cardiomegaly.  No active cardiopulmonary disease. Electronically Signed   By: Titus Dubin M.D.   On: 04/19/2018 18:31    Procedures Procedures (including critical care time)  Medications Ordered in ED Medications  diltiazem (CARDIZEM) 1 mg/mL load via infusion 10 mg (10 mg Intravenous Bolus from Bag 04/19/18 1947)  fentaNYL (SUBLIMAZE) injection 50 mcg (50 mcg Intravenous Given 04/19/18 2132)     Initial Impression / Assessment and Plan / ED Course  I have reviewed the triage vital signs and the nursing notes.  Pertinent labs & imaging results that were available during my care of the patient were reviewed by me and considered in my medical decision making (see chart for details).     Patient is a 62 year old female who presents with chest pain.  She is noted to be in  atrial fibrillation however heart rate is not particularly rapid.  Her initial heart rate was in the 120s but this improved to low 100s.  She did have a widened left bundle branch block which was noted on some prior EKGs but not on all her old EKGs.  Even after heart rate decrease, she still had some persistent complaints of intermittent chest and jaw pain.  Her initial troponin is negative.  I discussed the case with Dr. Gilmore Laroche with cardiology who feels the patient can be admitted to the hospitalist service.  I spoke with Dr. Myna Hidalgo who will admit the patient for further treatment.  This patients CHA2DS2-VASc Score and unadjusted Ischemic Stroke Rate (% per year) is equal to 4.8 % stroke rate/year from a score of 4  Above score calculated as 1 point each if present [CHF, HTN, DM, Vascular=MI/PAD/Aortic Plaque, Age if 65-74, or Female] Above score calculated as 2 points each if present [Age > 75, or Stroke/TIA/TE]    Final Clinical Impressions(s) / ED Diagnoses   Final diagnoses:  Nonspecific chest pain  Paroxysmal atrial fibrillation [facility])    ED Discharge Orders    None       Malvin Johns, MD 04/19/18 2211

## 2018-04-19 NOTE — H&P (Signed)
History and Physical    Katherine [name] VFI:[id number] DOB: 08-Mar-1956 DOA: 04/19/2018  PCP: [name], MD   Patient coming from: Home   Chief Complaint: Chest pain   HPI: Katherine [name] is a 62 y.o. female with medical history significant for coronary artery disease status post CABG and subsequent stents, depression with anxiety, chronic pain, renal transplant recipient, paroxysmal atrial fibrillation not on anticoagulation, and insulin-dependent diabetes mellitus, now presenting to the emergency department with chest pain.  Patient reports that she was in her usual state of health and having an uneventful day when she developed sudden onset of rapid palpitations while at rest.  This was followed by discomfort across the anterior chest, radiating up into the bilateral jaw, and associated with nausea and diaphoresis.  She took 324 mg of aspirin and nitroglycerin with slight improvement in her symptoms.  EMS was called, she was given additional nitroglycerin, and transported to the ED for further evaluation.  She denies any recent cough, increased swelling or tenderness in the lower extremities, fevers, or chills.  ED Course: Upon arrival to the ED, patient is found to be afebrile, saturating well on room air, tachycardic to the 120s, and with stable blood pressure.  EKG features atrial fibrillation with lateral T wave abnormalities.  Chest x-ray is negative for acute cardiopulmonary disease.  Chemistry panel is notable for glucose of 218 and normal renal function.  CBC features a slight anemia.  Troponin is normal at 0.02.  Patient was given 10 mg IV Cardizem in the ED for atrial fibrillation with rapid rate and heart rate normalized with this.  She was also given a dose of fentanyl and cardiology was consulted by the ED physician, recommended medical admission.  Patient remains hemodynamically stable with mild ongoing chest discomfort, and will be observed for ongoing evaluation and  management of this.  Review of Systems:  All other systems reviewed and apart from HPI, are negative.  Past Medical History:  Diagnosis Date  . Anemia   . Anxiety   . Bilateral carotid artery stenosis    Carotid duplex 11/1658: 6-30% LICA, 16-01% RICA, >09% RECA, f/u 1 yr suggested  . CAD (coronary artery disease) of bypass graft 5/01; 3/'02, 8/'03, 10/'04; 1/15   PCI x 5 to SVG-D1   . CAD in native artery 07/1993    3 Vessel Disease (LAD-D1 & RCA) -- CABG  . CAD S/P percutaneous coronary angioplasty    PCI to SVG-D1 insertion/native D1 x 4 = '01 -(S660 BMS 2.5 x 9 - insertion into D1; '02 - distal overlap ACS Pixel 2.5 x 8  BMS; '03 distal/native ISR/Thrombosis - Pixel 2.5 x 13; '04 - ISR-  Taxus 2.5 x 20 (covered all);; 1/15 - mid SVG-D1 (50% distal ISR) - Promus P 2.75 x 20 -- 2.8 mm  . COPD mixed type ([place])    Followed by Dr. Lamonte Sakai "pulmonologist said no COPD"  . Depression   . Depression with anxiety   . Diabetes mellitus type 2 in obese ([place])   . Diarrhea    started after cholecystectomy and mass removed from intestine  . Dyslipidemia, goal LDL below 70    08/2012: TC 137, TG 200, HDL 32!, LDL 45; on statin (followed by Dr.Deterding)  . ESRD (end stage renal disease) ([place]) 1991   s/p Cadaveric Renal Transplant [facility] - Dr. Jimmy Footman)   . Family history of adverse reaction to anesthesia    mom's bp dropped during/after anesthesia  . Fibromyalgia   .  GERD (gastroesophageal reflux disease)   . Glomerulonephritis, chronic, rapidly progressive 48  . 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 07/1993   Rescue PTCA of RCA -- referred for CABG.  . H/O: GI bleed   . Headache    migraines in the past  . History of kidney stones   . Hypertension associated with diabetes ([place])   . MRSA (methicillin resistant staph aureus) culture positive   . Obesity    s/p Lap Band 04/2004- Port Replacement 10/09 & 2/10 for infection; Dr. Excell Seltzer.  . OSA (obstructive  sleep apnea)    no longer on CPAP or home O2, states she doesn't need now after lap band  . PAD (peripheral artery disease) ([place]) 08/2013   LEA Dopplers to be read by Dr. Fletcher Anon  . PAF (paroxysmal atrial fibrillation) ([place]) 06/2014   Noted on CardioNet Monitor  - rate ~112; no recurrent symptoms. Nonionic regulation because of frequent GI procedures. Patient prefers Plavix  . Pneumonia   . Recurrent boils    Bilateral Groin  . Rheumatoid arthritis ([place])    Per Patient Report; associated with OA  . 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 Servando Snare: LIMA-LAD, SVG-bifurcatingD1, SVG-rPDA  . S/p cadaver renal transplant [place]  . Stroke [facility]) 2012   "right eye stroke- half blind now"  . Unstable angina ([place]) 5/01; 3/'02, 8/'03, 10/'04; 1/15   x 5 occurences since Inf-Post STEMI in 1994    Past Surgical History:  Procedure Laterality Date  . CARDIAC CATHETERIZATION  5/'01, 3/'02, 8/'03, 10/'04; 1/'15   08/22/2013: LAD & RCA 100%; LIMA-LAD & SVG-rPDA patent; Cx-- OM1 60%, OM2 ostial ~50%; SVG-D1 - 80% mid, 50% distal ISR --PCI  . CATHETER REMOVAL    . CHOLECYSTECTOMY N/A 10/29/2014   Procedure: LAPAROSCOPIC CHOLECYSTECTOMY WITH INTRAOPERATIVE CHOLANGIOGRAM;  Surgeon: Excell Seltzer, MD;  Location: [facility] ORS;  Service: General;  Laterality: N/A;  . CORONARY ANGIOPLASTY  1994   x5  . CORONARY ARTERY BYPASS GRAFT  1995   LIMA-LAD, SVG-RPDA, SVG-D1  . ESOPHAGOGASTRODUODENOSCOPY N/A 10/15/2016   Procedure: ESOPHAGOGASTRODUODENOSCOPY (EGD);  Surgeon: Wilford Corner, MD;  Location: [facility] ENDOSCOPY;  Service: Endoscopy;  Laterality: N/A;  . I&D EXTREMITY Right 01/29/2018   Procedure: IRRIGATION AND DEBRIDEMENT THUMB;  Surgeon: Dayna Barker, MD;  Location: [place];  Service: Plastics;  Laterality: Right;  . INCISE AND DRAIN ABCESS    . KIDNEY TRANSPLANT  1991  . KNEE ARTHROSCOPY WITH LATERAL MENISECTOMY Left 12/03/2017   Procedure: LEFT KNEE ARTHROSCOPY WITH LATERAL MENISECTOMY;  Surgeon: Earlie Server, MD;   Location: [place];  Service: Orthopedics;  Laterality: Left;  . LAPAROSCOPIC GASTRIC BANDING  04/2004; 10/'09, 2/'10   Port Replacement x 2  . LEFT HEART CATHETERIZATION WITH CORONARY/GRAFT ANGIOGRAM N/A 08/23/2013   Procedure: LEFT HEART CATHETERIZATION WITH Beatrix Fetters;  Surgeon: Wellington Hampshire, MD;  Location: [place] CATH LAB;  Service: Cardiovascular;  Laterality: N/A;  . Lower Extremity Arterial Dopplers  08/2013   ABI: R 0.96, L 1.04  . MULTIPLE TOOTH EXTRACTIONS  age 89  . NM MYOVIEW LTD  03/2016   EF 62%. LOW RISK. C/W prior MI - no Ischemia. Apical hypokinesis.  [name] Kitchen PERCUTANEOUS CORONARY STENT INTERVENTION (PCI-S)  5/'01, 3/'02, 8/'03, 10/'04;   '01 - S660 BMS 2.5 x 9 - dSVG-D1 into D1; '02- post-stent stenosis - 2.5 x 8 Pixel BMS; '8\03: ISR/Thrombosis into native D1 - AngioJet, 2.5 x 13 Pixel; '04 - ISR 95% - covered  stented area with Taxus DES 2.5 mm x 20 (2.88)  . PERCUTANEOUS CORONARY STENT INTERVENTION (PCI-S)  08/23/2013   Procedure: PERCUTANEOUS CORONARY STENT INTERVENTION (PCI-S);  Surgeon: Wellington Hampshire, MD;  Location: [facility] CATH LAB;  Service: Cardiovascular;;mid SVG-D1 80%; distal stent ~50% ISR; Promus Prermier DES 2.75 mm xc 20 mm (2.8 mm)  . PORT-A-CATH REMOVAL     kidney  . TRANSTHORACIC ECHOCARDIOGRAM  02/2016   EF 55-60%. Septal dyssynergy from CABG GR 2 DD. Aortic valve is trileaflet the functional bicuspid with sclerosis but not yet notable for stenosis stenosis.; no Significant change  . TUBAL LIGATION    . wrist fistula repair Left    dialysis for one year     reports that she quit smoking about 15 years ago. Her smoking use included cigarettes. She has a 30.00 pack-year smoking history. She has never used smokeless tobacco. She reports that she does not drink alcohol or use drugs.  Allergies  Allergen Reactions  . Tetracycline Hives  . Hydromorphone Hcl Nausea And Vomiting  . Niacin Other (See Comments)    Mouth blisters  . Niaspan [Niacin Er] Other (See  Comments)    Mouth blisters  . Sulfa Antibiotics Nausea Only and Other (See Comments)    "Tears up stomach"  . Sulfonamide Derivatives Other (See Comments)    Reaction: per patient "tears her stomach up"  . Codeine Nausea And Vomiting  . Erythromycin Nausea And Vomiting  . Hydromorphone Hcl Nausea And Vomiting  . Morphine And Related Nausea And Vomiting  . Nalbuphine Nausea And Vomiting  . Sulfasalazine Nausea Only and Other (See Comments)    per patient "tears her stomach up", "Tears up stomach"  . Tape Rash and Other (See Comments)    No "plastic" tape," please    Family History  Problem Relation Age of Onset  . Cancer Mother        liver  . Heart disease Father   . Cancer Father        colon  . Arrhythmia Brother        Atrial Fibrillation  . Arrhythmia Paternal Aunt        Atrial Fibrillation     Prior to Admission medications   Medication Sig Start Date End Date Taking? Authorizing Provider  acetaminophen (TYLENOL) 500 MG tablet Take 1 tablet (500 mg total) by mouth every 6 (six) hours as needed for moderate pain or headache. Patient taking differently: Take 1,000 mg by mouth every 6 (six) hours as needed for moderate pain or headache.  10/17/16  Yes Arrien, Jimmy Picket, MD  albuterol (VENTOLIN HFA) 108 (90 Base) MCG/ACT inhaler INHALE 2 PUFFS INTO THE LUNGS EVERY 6 (SIX) HOURS AS NEEDED FOR WHEEZING OR SHORTNESS OF BREATH. Patient taking differently: Inhale 2 puffs into the lungs every 6 (six) hours as needed for wheezing or shortness of breath.  06/14/17  Yes Collene Gobble, MD  azaTHIOprine (IMURAN) 50 MG tablet Take 125 mg by mouth every evening.    Yes [provider]  budesonide-formoterol (SYMBICORT) 160-4.5 MCG/ACT inhaler Inhale 2 puffs into the lungs 2 (two) times daily. 10/07/17  Yes Collene Gobble, MD  calcitRIOL (ROCALTROL) 0.25 MCG capsule Take 0.25 mcg by mouth every 3 (three) days.    Yes [provider]  carbamazepine (TEGRETOL) 200  MG tablet TAKE 200 MG BY MOUTH TWICE A DAY Patient taking differently: Take 200 mg by mouth 2 (two) times daily.  03/11/18  Yes Bayard Hugger,  NP  clopidogrel (PLAVIX) 75 MG tablet TAKE 1 TABLET BY MOUTH EVERY DAY Patient taking differently: Take 75 mg by mouth daily.  02/14/18  Yes [name], MD  clotrimazole [facility]) 10 MG troche Take 1 tablet (10 mg total) by mouth 5 (five) times daily. Patient taking differently: Take 10 mg by mouth daily as needed (thrush).  10/21/16  Yes Domenic Polite, MD  cyclobenzaprine (FLEXERIL) 10 MG tablet TAKE 1 TABLET BY MOUTH 3 TIMES DAILY AS NEEDED FOR MUSCLE SPASMS Patient taking differently: Take 10 mg by mouth 2 (two) times daily.  10/19/17  Yes Meredith Staggers, MD  diltiazem (CARDIZEM CD) 120 MG 24 hr capsule TAKE 1 CAPSULE (120 MG TOTAL) BY MOUTH DAILY. NEED OV. Patient taking differently: Take 120 mg by mouth daily.  02/22/17  Yes [name], MD  DULoxetine (CYMBALTA) 30 MG capsule TAKE 2 CAPSULES (60 MG TOTAL) BY MOUTH EVERY MORNING. 01/26/18  Yes Meredith Staggers, MD  erythromycin base (E-MYCIN) 500 MG tablet Take 500 mg by mouth daily.   Yes [provider]  famotidine (PEPCID) 20 MG tablet Take 20 mg by mouth daily. 05/19/17  Yes [provider]  ferrous sulfate 325 (65 FE) MG tablet Take 1 tablet (325 mg total) by mouth 3 (three) times daily with meals. Patient taking differently: Take 325 mg by mouth 2 (two) times daily.  10/17/16  Yes Arrien, Jimmy Picket, MD  fluticasone [facility]) 50 MCG/ACT nasal spray Place 2 sprays into both nostrils 2 (two) times daily. 12/13/17  Yes Collene Gobble, MD  glimepiride (AMARYL) 4 MG tablet Take 4 mg by mouth every morning. 04/20/16  Yes [provider]  guaiFENesin (MUCINEX) 600 MG 12 hr tablet Take 1 tablet (600 mg total) by mouth 2 (two) times daily. Patient taking differently: Take 600 mg by mouth 2 (two) times daily as needed for cough or to loosen phlegm.  02/02/18  Yes  Bonnielee Haff, MD  Insulin Glargine (BASAGLAR KWIKPEN) 100 UNIT/ML SOPN Inject 12 Units into the skin daily.   Yes [provider]  loperamide (IMODIUM A-D) 2 MG tablet Take 4 mg by mouth 2 (two) times daily.   Yes [provider]  LORazepam (ATIVAN) 1 MG tablet Take 1 mg by mouth every morning.    Yes [provider]  metFORMIN (GLUCOPHAGE) 500 MG tablet Take 500 mg by mouth 2 (two) times daily. 10/03/16  Yes [provider]  metoprolol tartrate (LOPRESSOR) 25 MG tablet Take 1 tablet (25 mg total) by mouth 2 (two) times daily. 03/17/18  Yes [name], MD  montelukast (SINGULAIR) 10 MG tablet TAKE 1 TABLET BY MOUTH EVERY DAY 01/11/18  Yes Byrum, Rose Fillers, MD  nitroGLYCERIN (NITROSTAT) 0.4 MG SL tablet Place 1 tablet (0.4 mg total) under the tongue every 5 (five) minutes as needed for chest pain. 09/11/13  Yes [name], MD  NOVOLOG FLEXPEN 100 UNIT/ML FlexPen Inject 4 Units into the skin 2 (two) times daily as needed for high blood sugar.  12/13/17  Yes [provider]  omeprazole (PRILOSEC) 40 MG capsule Take 40 mg by mouth every morning.    Yes [provider]  oxyCODONE (OXY IR/ROXICODONE) 5 MG immediate release tablet Take 1 tablet (5 mg total) by mouth every 8 (eight) hours as needed for severe pain. 02/14/18  Yes Bayard Hugger, NP  predniSONE (DELTASONE) 5 MG tablet Take 5 mg by mouth daily with breakfast.   Yes [provider]  promethazine (PHENERGAN) 25 MG tablet Take 1 tablet (25 mg total) by mouth every 6 (six) hours as needed for nausea or vomiting (or sleep). 02/03/18  Yes Deno Etienne, DO  rosuvastatin (CRESTOR) 20 MG tablet Take 1 tablet (20 mg total) by mouth daily. 10/25/17  Yes [name], MD  sertraline (ZOLOFT) 100 MG tablet Take 100 mg by mouth every morning.   Yes [provider]    Physical Exam: Vitals:   04/19/18 2100 04/19/18 2115 04/19/18 2215 04/19/18 2255  BP: 110/78   (!) 137/100    Pulse: 69 85 64 (!) 108  Resp: 20 20 14 18   Temp:    97.6 F (36.4 C)  TempSrc:      SpO2: 97% 95% 99% 94%  Weight:      Height:          Constitutional: NAD, calm  Eyes: PERTLA, lids and conjunctivae normal ENMT: Mucous membranes are moist. Posterior pharynx clear of any exudate or lesions.   Neck: normal, supple, no masses, no thyromegaly Respiratory: clear to auscultation bilaterally, no wheezing, no crackles. Normal respiratory effort.    Cardiovascular: Rate ~80 and irregular. No significant JVD. Abdomen: No distension, no tenderness, soft. Bowel sounds normal.  Musculoskeletal: no clubbing / cyanosis. Right thumb with wound vac.  Skin: no significant rashes, lesions, ulcers. Warm, dry, well-perfused. Neurologic: CN 2-12 grossly intact. Sensation intact. Strength 5/5 in all 4 limbs.  Psychiatric: Alert and oriented x 3. Calm, cooperative.     Labs on Admission: I have personally reviewed following labs and imaging studies  CBC: Recent Labs  Lab 04/19/18 1844  WBC 5.5  HGB 11.2*  HCT 35.7*  MCV 98.9  PLT 242   Basic Metabolic Panel: Recent Labs  Lab 04/19/18 1844  NA 139  K 4.0  CL 106  CO2 22  GLUCOSE 218*  BUN 16  CREATININE 0.92  CALCIUM 9.0   GFR: Estimated Creatinine Clearance: 72.7 mL/min (by C-G formula based on SCr of 0.92 mg/dL). Liver Function Tests: No results for input(s): AST, ALT, ALKPHOS, BILITOT, PROT, ALBUMIN in the last 168 hours. No results for input(s): LIPASE, AMYLASE in the last 168 hours. No results for input(s): AMMONIA in the last 168 hours. Coagulation Profile: No results for input(s): INR, PROTIME in the last 168 hours. Cardiac Enzymes: Recent Labs  Lab 04/19/18 2121  TROPONINI 0.03*   BNP (last 3 results) No results for input(s): PROBNP in the last 8760 hours. HbA1C: No results for input(s): HGBA1C in the last 72 hours. CBG: Recent Labs  Lab 04/19/18 1858 04/19/18 2257  GLUCAP 205* 152*   Lipid Profile: No  results for input(s): CHOL, HDL, LDLCALC, TRIG, CHOLHDL, LDLDIRECT in the last 72 hours. Thyroid Function Tests: No results for input(s): TSH, T4TOTAL, FREET4, T3FREE, THYROIDAB in the last 72 hours. Anemia Panel: No results for input(s): VITAMINB12, FOLATE, FERRITIN, TIBC, IRON, RETICCTPCT in the last 72 hours. Urine analysis:    Component Value Date/Time   COLORURINE YELLOW 01/28/2018 2318   APPEARANCEUR CLEAR 01/28/2018 2318   LABSPEC 1.015 01/28/2018 2318   PHURINE 5.0 01/28/2018 2318   GLUCOSEU NEGATIVE 01/28/2018 2318   HGBUR NEGATIVE 01/28/2018 2318   BILIRUBINUR NEGATIVE 01/28/2018 2318   KETONESUR NEGATIVE 01/28/2018 2318   PROTEINUR NEGATIVE 01/28/2018 2318   UROBILINOGEN 0.2 10/16/2013 0110   NITRITE NEGATIVE 01/28/2018 2318   LEUKOCYTESUR NEGATIVE 01/28/2018 2318   Sepsis Labs: @LABRCNTIP (procalcitonin:4,lacticidven:4) )No results found for this or any previous visit (from  the past 240 hour(s)).   Radiological Exams on Admission: Dg Chest 2 View  Result Date: 04/19/2018 CLINICAL DATA:  Chest pain. EXAM: CHEST - 2 VIEW COMPARISON:  Chest x-ray dated February 03, 2018. FINDINGS: The patient is rotated to the left. Stable cardiomegaly status post CABG. Normal pulmonary vascularity. No focal consolidation, pleural effusion, or pneumothorax. No acute osseous abnormality. Multiple broken median sternotomy wires are unchanged. IMPRESSION: Stable cardiomegaly.  No active cardiopulmonary disease. Electronically Signed   By: Titus Dubin M.D.   On: 04/19/2018 18:31    EKG: Independently reviewed. Atrial fibrillation, lateral T-wave abnormalities.   Assessment/Plan   1. Chest pain; CAD  - Presents with chest pain that was preceded by rapid palpation  - Initial troponin is normal, EKG with lateral T-wave inversions that are similar to prior, CXR without acute findings  - Cardiology was consulted by ED physician and recommended medical admission  - She was given ASA 324 mg prior  to arrival in ED  - Continue cardiac monitoring, check serial troponins, repeat EKG, start IV heparin in light of recurrent a fib, continue Plavix, statin, and beta-blocker   2. Paroxysmal atrial fibrillation  - In atrial fibrillation on presentation, rate was in 120's in ED and normalized with diltiazem 10 mg IV  - CHADS-VASc is at least 4 (gender, CAD, DM, HTN)  - She had not been anticoagulated previously d/t frequent GI-procedures per cards notes from 2017 and had been sustaining SR at that time, but was planned to start Eliquis if recurrent  - Anticoagulate with IV heparin for now given recurrence in a fib and possible ACS  - Continue diltiazem and metoprolol   3. Insulin-dependent DM  - A1c was 7.1% in June  - Managed at home with glimepiride, metformin, Lantus 12 units daily, and Novolog 4 units BID  - Check CBG's, continue Lantus and Novolog   4. Squamous cell carcinoma of right thumb  - Status-post resection  - Has wound vac in place  - She will continue hand-surgery follow-up as scheduled   5. Chronic pain  - Continue home-regimen with Cymbalta, Flexeril, and prn oxycodone   6. Depression with anxiety  - Continue Cymbalta, Zoloft, Ativan    7. Renal transplant recipient   - Renal function normal  - Continue prednisone and Imuran     DVT prophylaxis: IV heparin infusion  Code Status: Full  Family Communication: Discussed with patient  Consults called: Cardiology consulted by ED physician  Admission status: observation     Vianne Bulls, MD [facility] Pager [phone]  If 7PM-7AM, please contact night-coverage [email or web address] Password [email or web address]  04/19/2018, 11:20 PM

## 2018-04-19 NOTE — ED Notes (Signed)
Patient transported to X-ray 

## 2018-04-20 ENCOUNTER — Encounter (HOSPITAL_COMMUNITY): Payer: Self-pay | Admitting: Physician Assistant

## 2018-04-20 ENCOUNTER — Other Ambulatory Visit: Payer: Self-pay

## 2018-04-20 DIAGNOSIS — I480 Paroxysmal atrial fibrillation: Secondary | ICD-10-CM | POA: Diagnosis not present

## 2018-04-20 DIAGNOSIS — I209 Angina pectoris, unspecified: Secondary | ICD-10-CM

## 2018-04-20 DIAGNOSIS — E1142 Type 2 diabetes mellitus with diabetic polyneuropathy: Secondary | ICD-10-CM | POA: Diagnosis not present

## 2018-04-20 DIAGNOSIS — R079 Chest pain, unspecified: Secondary | ICD-10-CM

## 2018-04-20 LAB — TSH: TSH: 3.118 u[IU]/mL (ref 0.350–4.500)

## 2018-04-20 LAB — BASIC METABOLIC PANEL
ANION GAP: 9 (ref 5–15)
BUN: 14 mg/dL (ref 6–20)
CHLORIDE: 107 mmol/L (ref 98–111)
CO2: 23 mmol/L (ref 22–32)
Calcium: 8.9 mg/dL (ref 8.9–10.3)
Creatinine, Ser: 0.87 mg/dL (ref 0.44–1.00)
GFR calc Af Amer: >60 mL/min (ref >60)
GLUCOSE: 185 mg/dL — AB (ref 70–99)
POTASSIUM: 4.6 mmol/L (ref 3.5–5.1)
Sodium: 139 mmol/L (ref 135–145)

## 2018-04-20 LAB — MRSA PCR SCREENING: MRSA by PCR: NEGATIVE

## 2018-04-20 LAB — HEPARIN LEVEL (UNFRACTIONATED)
HEPARIN UNFRACTIONATED: 0.55 [IU]/mL (ref 0.30–0.70)
Heparin Unfractionated: 0.77 IU/mL — ABNORMAL HIGH (ref 0.30–0.70)

## 2018-04-20 LAB — TROPONIN I
Troponin I: <0.03 ng/mL (ref <0.03)
Troponin I: 0.03 ng/mL (ref <0.03)
Troponin I: <0.03 ng/mL (ref <0.03)

## 2018-04-20 LAB — GLUCOSE, CAPILLARY
GLUCOSE-CAPILLARY: 156 mg/dL — AB (ref 70–99)
GLUCOSE-CAPILLARY: 63 mg/dL — AB (ref 70–99)
GLUCOSE-CAPILLARY: 212 mg/dL — AB (ref 70–99)
Glucose-Capillary: 127 mg/dL — ABNORMAL HIGH (ref 70–99)
Glucose-Capillary: 127 mg/dL — ABNORMAL HIGH (ref 70–99)

## 2018-04-20 LAB — MAGNESIUM: MAGNESIUM: 1.9 mg/dL (ref 1.7–2.4)

## 2018-04-20 IMAGING — CT CT HEAD W/O CM
4 series · 16 of 30 positions shown, 18 images · non-contrast
Comparison: 02/09/2005 CT of the head.

CLINICAL DATA: 60 y/o F; multiple falls with headache and neck
pain.

EXAM:
CT HEAD WITHOUT CONTRAST
CT CERVICAL SPINE WITHOUT CONTRAST
TECHNIQUE: Multidetector CT imaging of the head and cervical spine was
performed following the standard protocol without intravenous
contrast. Multiplanar CT image reconstructions of the cervical spine
were also generated.

[Series 2: head w/(date) · axial · 0.42mm/px · z∈[-117,-67]mm · 2 of 30 slices shown]
[im 10/30  brain]
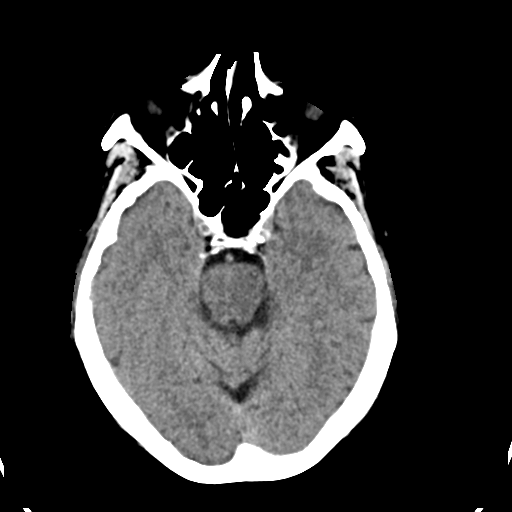
[im 20/30  brain]
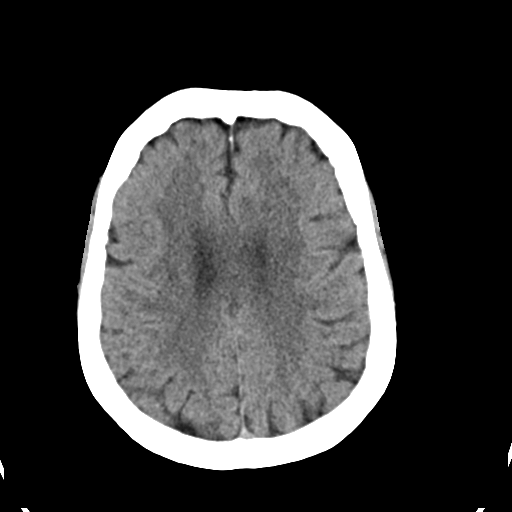

[Series 3: cspine soft · axial · 0.23mm/px · z∈[-266,-248]mm · 2 of 81 slices shown]
[im 9/81  brain]
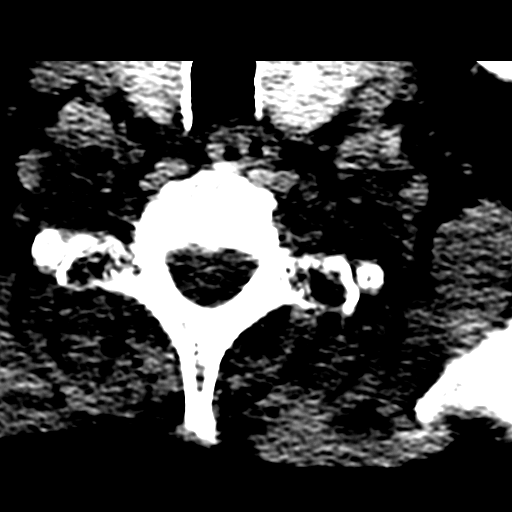
[im 18/81  brain]
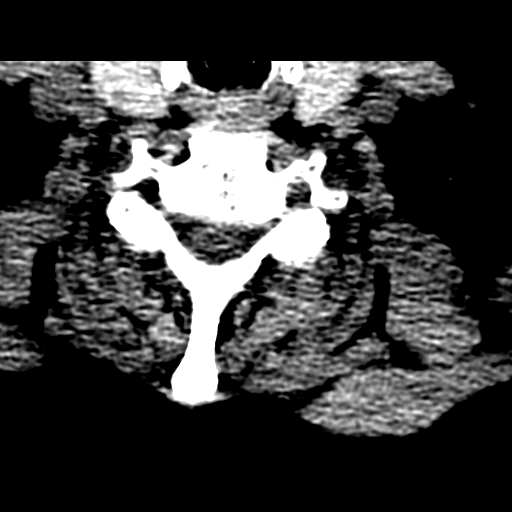

[Series 4: bone · axial · 0.42mm/px · z∈[-135,-45]mm · 4 of 50 slices shown]
[im 10/50  bone]
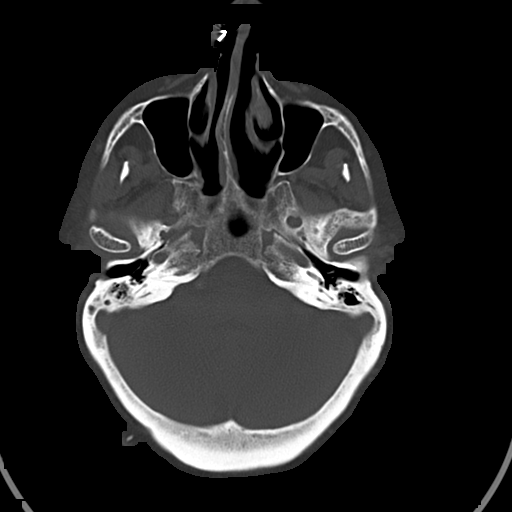
[im 20/50  bone]
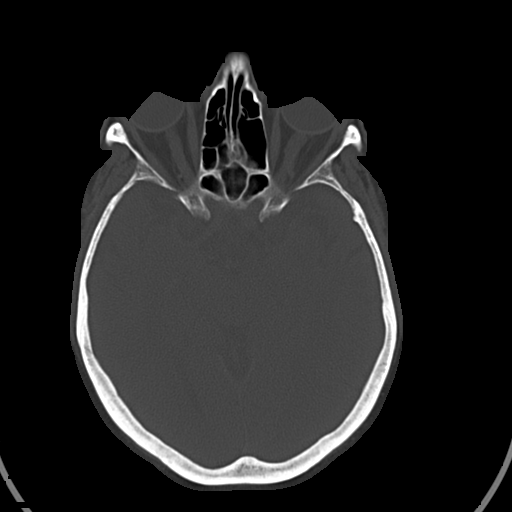
[im 30/50  bone]
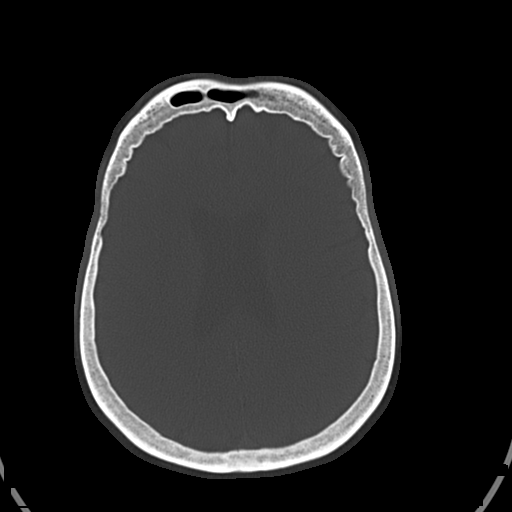
[im 40/50  bone]
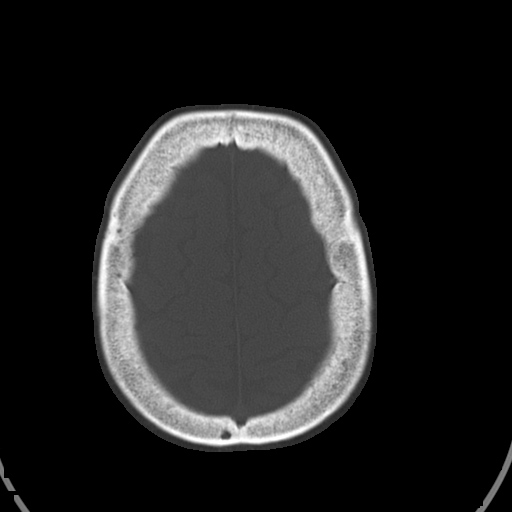

[Series 12: angled axial · axial · 0.23mm/px · z∈[-284,-164]mm · 8 of 79 slices shown, 10 images]
[im 9/79  brain]
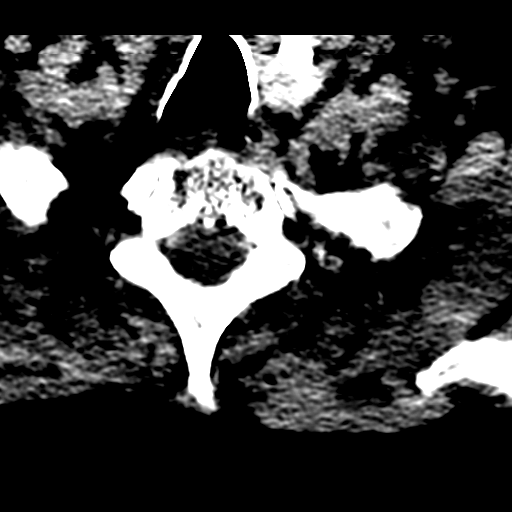
[im 9/79  bone]
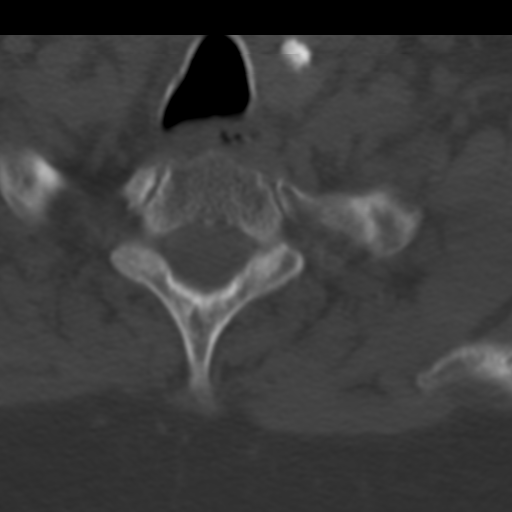
[im 18/79  brain]
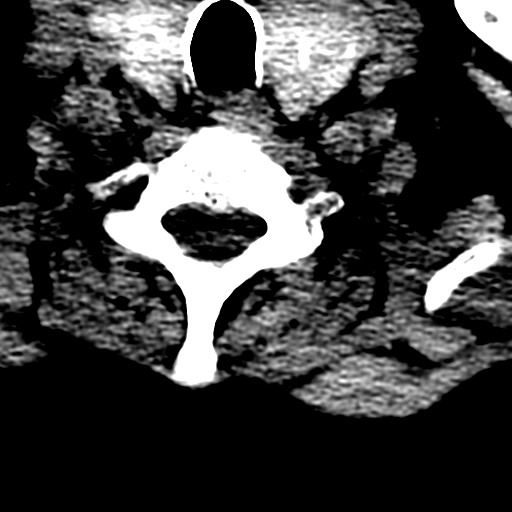
[im 27/79  brain]
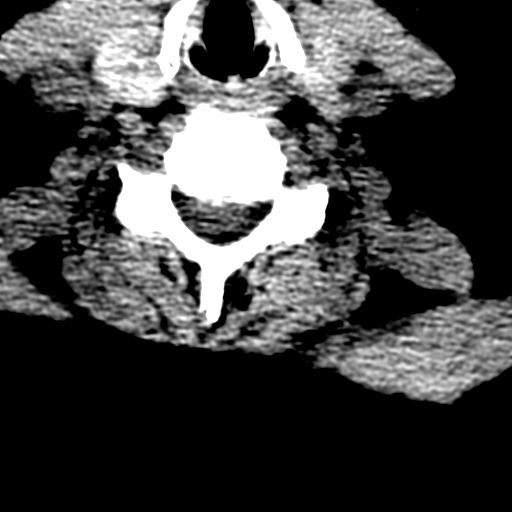
[im 35/79  brain]
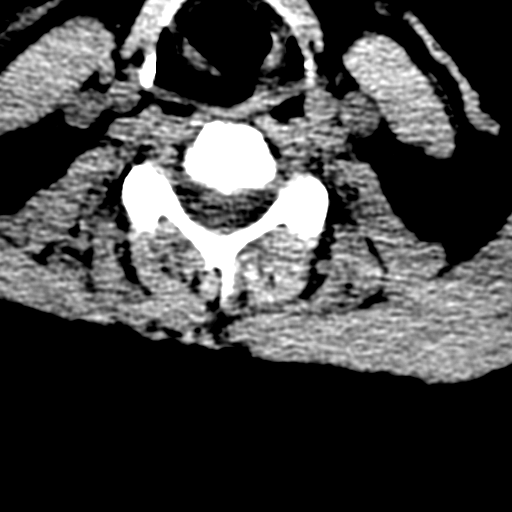
[im 44/79  brain]
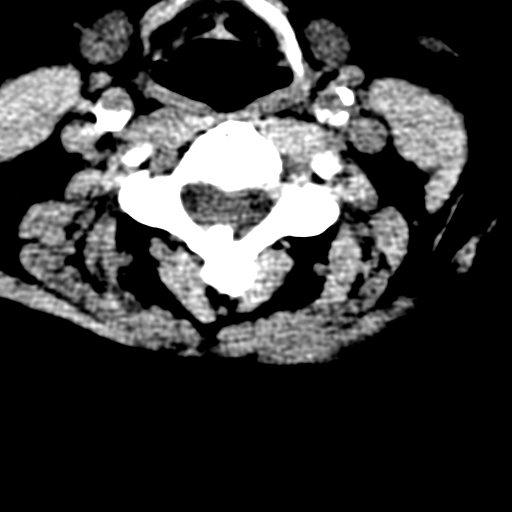
[im 44/79  bone]
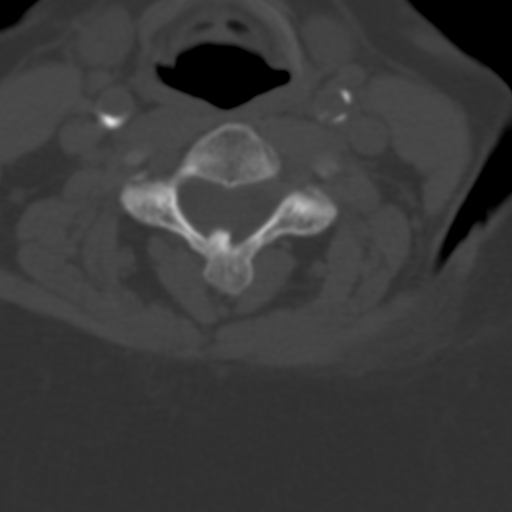
[im 53/79  brain]
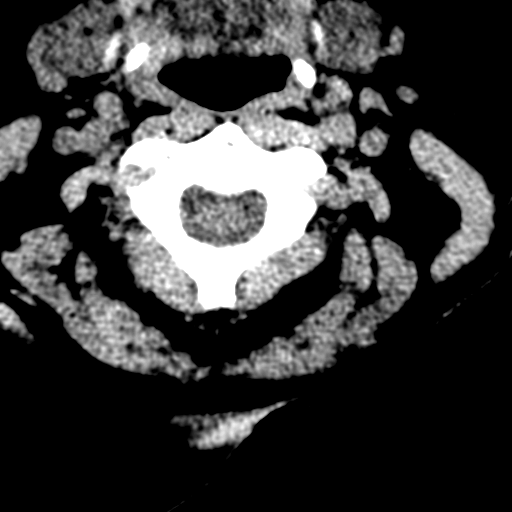
[im 61/79  brain]
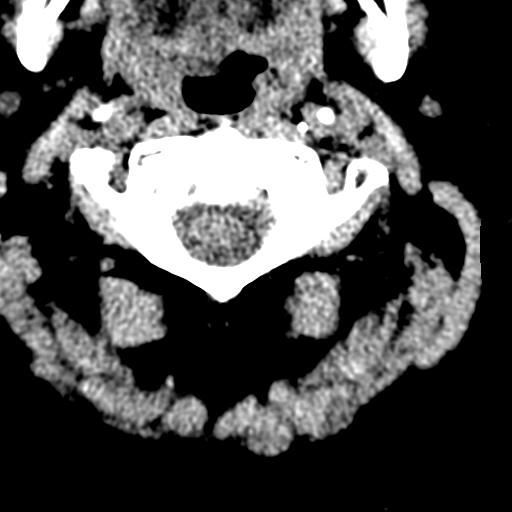
[im 70/79  brain]
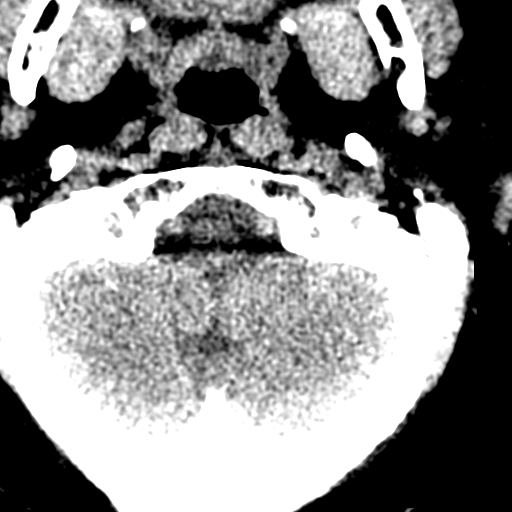

[16 of 30 positions shown; findings below may reference images not displayed]

FINDINGS: CT HEAD FINDINGS

Brain: No evidence of acute infarction, hemorrhage, hydrocephalus,
extra-axial collection or mass lesion/mass effect. Mild chronic
microvascular ischemic changes of the brain. Lucency within the left
anterior inferior lentiform nucleus may represent a prominent
perivascular space or chronic lacunar infarction.

Vascular: Moderate calcific atherosclerosis of carotid siphons.

Skull: Normal. Negative for fracture or focal lesion.

Sinuses/Orbits: No acute finding.

Other: None.

CT CERVICAL SPINE FINDINGS

Alignment: Mild levocurvature of the cervical spine. Straightening
of cervical lordosis without listhesis.

Skull base and vertebrae: No acute fracture. No primary bone lesion
or focal pathologic process.

Soft tissues and spinal canal: No prevertebral fluid or swelling. No
visible canal hematoma.

Disc levels: Mild multilevel discogenic degenerative changes
greatest at the C5-6 level.

Upper chest: Not included within the field of view.

Other: Severe calcific atherosclerosis of carotid bifurcations.
Multinodular thyroid goiter with dense calcifications.
IMPRESSION: CT head:

1. No acute intracranial abnormality.
2. Mild chronic microvascular ischemic changes. Left basal ganglia
lucency may represent chronic lacunar infarct or prominent
perivascular space.
CT cervical spine:

1. No acute fracture or dislocation of the cervical spine.
2. Mild degenerative changes of the cervical spine.
3. Severe calcific atherosclerosis of carotid bifurcations.

By: Garsalli Bader M.D.

## 2018-04-20 MED ORDER — SODIUM CHLORIDE 0.9 % WEIGHT BASED INFUSION: 3.0000 mL/kg/h | INTRAVENOUS | Status: DC

## 2018-04-20 MED ORDER — INFLUENZA VAC SPLIT QUAD 0.5 ML IM SUSY
0.5000 mL | PREFILLED_SYRINGE | INTRAMUSCULAR | Status: AC
  Administered 2018-04-21: 0.5 mL via INTRAMUSCULAR
  Filled 2018-04-20: qty 0.5

## 2018-04-20 MED ORDER — ASPIRIN 81 MG PO CHEW
81.0000 mg | CHEWABLE_TABLET | ORAL | Status: AC
  Administered 2018-04-21: 81 mg via ORAL
  Filled 2018-04-20: qty 1

## 2018-04-20 MED ORDER — SODIUM CHLORIDE 0.9 % IV SOLN: 250.0000 mL | INTRAVENOUS | Status: DC | PRN

## 2018-04-20 MED ORDER — SODIUM CHLORIDE 0.9 % WEIGHT BASED INFUSION
1.0000 mL/kg/h | INTRAVENOUS | Status: DC
  Administered 2018-04-21: 1.003 mL/kg/h via INTRAVENOUS
  Administered 2018-04-21: 1 mL/kg/h via INTRAVENOUS

## 2018-04-20 MED ORDER — PROMETHAZINE HCL 25 MG PO TABS
25.0000 mg | ORAL_TABLET | Freq: Every evening | ORAL | Status: DC | PRN
  Administered 2018-04-20 – 2018-04-23 (×6): 25 mg via ORAL
  Filled 2018-04-20 (×6): qty 1

## 2018-04-20 MED ORDER — CLOPIDOGREL BISULFATE 75 MG PO TABS
75.0000 mg | ORAL_TABLET | ORAL | Status: AC
  Administered 2018-04-21: 75 mg via ORAL
  Filled 2018-04-20: qty 1

## 2018-04-20 MED ORDER — APIXABAN 5 MG PO TABS: 5.0000 mg | ORAL_TABLET | Freq: Two times a day (BID) | ORAL | Status: DC

## 2018-04-20 MED ORDER — SODIUM CHLORIDE 0.9% FLUSH: 3.0000 mL | INTRAVENOUS | Status: DC | PRN

## 2018-04-20 MED ORDER — SODIUM CHLORIDE 0.9% FLUSH
3.0000 mL | Freq: Two times a day (BID) | INTRAVENOUS | Status: DC
  Administered 2018-04-21: 3 mL via INTRAVENOUS

## 2018-04-20 MED ORDER — DEXTROSE 50 % IV SOLN
INTRAVENOUS | Status: AC
  Administered 2018-04-20: 25 mL
  Filled 2018-04-20: qty 50

## 2018-04-20 NOTE — Progress Notes (Signed)
[facility] PROGRESS NOTE  Katherine [name] ZOX:[id number] DOB: [birth year] DOA: 04/19/2018  PCP: [name], MD  Brief History/Interval Summary: 62 y.o. female with medical history significant for coronary artery disease status post CABG and subsequent stents, depression with anxiety, chronic pain, renal transplant recipient, paroxysmal atrial fibrillation not on anticoagulation, and insulin-dependent diabetes mellitus, presented to the emergency department with chest pain.  Patient reports that she was in her usual state of health and having an uneventful day when she developed sudden onset of rapid palpitations while at rest.  This was followed by discomfort across the anterior chest, radiating up into the bilateral jaw, and associated with nausea and diaphoresis.  Patient was seen in the emergency department.  She was given a dose of intravenous Cardizem with improvement in heart rate.  She was hospitalized for further management.  Reason for Visit: Paroxysmal atrial fibrillation.  Chest pain.  Consultants: Cardiology  Procedures: None yet  Antibiotics: None  Subjective/Interval History: Patient denies any chest pain currently.  No shortness of breath.  She is feeling a bit anxious.  Denies any nausea or vomiting.  Requesting to speak to her cardiologist.  ROS: Denies any headaches  Objective:  Vital Signs  Vitals:   04/20/18 0055 04/20/18 0420 04/20/18 0805 04/20/18 0858  BP:  128/69 (!) 110/55 136/73  Pulse:  65 73   Resp:  17    Temp: 97.6 F (36.4 C) 98.5 F (36.9 C) 98.2 F (36.8 C)   TempSrc: Oral Oral Oral   SpO2:  98%    Weight:  104.7 kg    Height:        Intake/Output Summary (Last 24 hours) at 04/20/2018 1028 Last data filed at 04/20/2018 0100 Gross per 24 hour  Intake 148.26 ml  Output -  Net 148.26 ml   Filed Weights   04/19/18 1748 04/20/18 0420  Weight: 105.2 kg 104.7 kg    General appearance: alert, cooperative, appears stated age  and no distress Head: Normocephalic, without obvious abnormality, atraumatic Resp: Normal effort at rest.  Diminished air entry at the bases.  No wheezing rales or rhonchi. Cardio: regular rate and rhythm, S1, S2 normal, no murmur, click, rub or gallop GI: soft, non-tender; bowel sounds normal; no masses,  no organomegaly Extremities: extremities normal, atraumatic, no cyanosis or edema Neurologic: Alert and oriented x3.  No focal neurological deficits.  Lab Results:  Data Reviewed: I have personally reviewed following labs and imaging studies  CBC: Recent Labs  Lab 04/19/18 1844  WBC 5.5  HGB 11.2*  HCT 35.7*  MCV 98.9  PLT 811    Basic Metabolic Panel: Recent Labs  Lab 04/19/18 1844 04/20/18 0352  NA 139 139  K 4.0 4.6  CL 106 107  CO2 22 23  GLUCOSE 218* 185*  BUN 16 14  CREATININE 0.92 0.87  CALCIUM 9.0 8.9  MG  --  1.9    GFR: Estimated Creatinine Clearance: 76.6 mL/min (by C-G formula based on SCr of 0.87 mg/dL).  Cardiac Enzymes: Recent Labs  Lab 04/19/18 2121 04/20/18 0352 04/20/18 0740  TROPONINI 0.03* <0.03 0.03*    CBG: Recent Labs  Lab 04/19/18 1858 04/19/18 2257 04/19/18 2348 04/20/18 0808  GLUCAP 205* 152* 190* 63*    Thyroid Function Tests: Recent Labs    04/20/18 0740  TSH 3.118    Radiology Studies: Dg Chest 2 View  Result Date: 04/19/2018 CLINICAL DATA:  Chest pain. EXAM: CHEST - 2 VIEW COMPARISON:  Chest  x-ray dated February 03, 2018. FINDINGS: The patient is rotated to the left. Stable cardiomegaly status post CABG. Normal pulmonary vascularity. No focal consolidation, pleural effusion, or pneumothorax. No acute osseous abnormality. Multiple broken median sternotomy wires are unchanged. IMPRESSION: Stable cardiomegaly.  No active cardiopulmonary disease. Electronically Signed   By: Titus Dubin M.D.   On: 04/19/2018 18:31     Medications:  Scheduled: . azaTHIOprine  125 mg Oral QPM  . calcitRIOL  0.25 mcg Oral Q3 days  .  carbamazepine  200 mg Oral BID  . clopidogrel  75 mg Oral Daily  . cyclobenzaprine  10 mg Oral BID  . diltiazem  120 mg Oral Daily  . DULoxetine  60 mg Oral q morning - 10a  . famotidine  20 mg Oral Daily  . insulin aspart  0-9 Units Subcutaneous Q4H  . insulin glargine  8 Units Subcutaneous QHS  . LORazepam  1 mg Oral q morning - 10a  . metoprolol tartrate  25 mg Oral BID  . mometasone-formoterol  2 puff Inhalation BID  . montelukast  10 mg Oral QHS  . pantoprazole  40 mg Oral Daily  . predniSONE  5 mg Oral Q breakfast  . rosuvastatin  20 mg Oral q1800  . sertraline  100 mg Oral q morning - 10a   Continuous: . heparin 950 Units/hr (04/20/18 0850)   XHB:ZJIRCVELFYBOF, albuterol, guaiFENesin, nitroGLYCERIN, ondansetron (ZOFRAN) IV, oxyCODONE, promethazine  Assessment/Plan:    Chest pain in the setting of known coronary artery disease Chest pain could have been due to tachyarrhythmia.  Currently asymptomatic.  No significant elevation in troponin levels.  Patient is on aspirin.  Patient to be seen by cardiology today.  Patient is on Plavix.  Also on beta-blocker and statin.  Paroxysmal atrial fibrillation She was in atrial fibrillation with RVR at present patient had heart rate of 120s.  She was given diltiazem x1.  Appears to have converted to sinus rhythm.  Cardiology input is pending.  She is on IV heparin currently.  On oral Cardizem for rate control.  Chads 2 vascular score is at least 4.  Not previously anticoagulated due to frequent multiple GI procedures.  TSH normal.  Echocardiogram last done in March which showed normal systolic function, grade 2 diastolic dysfunction and mild aortic stenosis.  Insulin-dependent diabetes mellitus HbA1c was 7.1 in June.  Monitor CBGs.  Continue Lantus and NovoLog.  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  Has a wound VAC in place.  Will need to follow-up with her hand surgeon.  Chronic  pain Continue home regimen with Cymbalta Flexeril and oxycodone as needed.  History of depression with anxiety Continue home medications.  Renal transplant recipient Renal function is normal.  Continue prednisone and Imuran.  DVT Prophylaxis: Currently on IV heparin    Code Status: Full code Family Communication: Discussed with the patient Disposition Plan: Await cardiology input.    LOS: 0 days   [facility] Pager [phone] 04/20/2018, 10:28 AM  If 7PM-7AM, please contact night-coverage at [email or web address], password [facility]

## 2018-04-20 NOTE — Progress Notes (Signed)
Inpatient Diabetes Program Recommendations  AACE/ADA: New Consensus Statement on Inpatient Glycemic Control (2015)  Target Ranges:  Prepandial:   less than 140 mg/dL      Peak postprandial:   less than 180 mg/dL (1-2 hours)      Critically ill patients:  140 - 180 mg/dL   Lab Results  Component Value Date   GLUCAP 63 (L) 04/20/2018   HGBA1C 7.1 (H) 01/28/2018    Review of Glycemic Control Results for Katherine [name], Katherine [name] (MRN [id number]) as of 04/20/2018 11:38  Ref. Range 04/19/2018 18:58 04/19/2018 22:57 04/19/2018 23:48 04/20/2018 08:08  Glucose-Capillary Latest Ref Range: 70 - 99 mg/dL 205 (H) 152 (H) 190 (H) 63 (L)   Diabetes history: Type 2 DM Outpatient Diabetes medications: Novolog 4 units BID, Amaryl 4 mg QAM, Basaglar 12 units QD, Metformin 500 mg BID Current orders for Inpatient glycemic control: Novolog 0-9 units Q4H, Lantus 8 units QHS, Prednisone 5 mg QAM  Inpatient Diabetes Program Recommendations:    Noted patient experienced hypoglycemic event this am of 63 mg/dL. Patient has diet order in for carb modified. Consider switching correction to Novolog 0-9 units TID.  Thanks, Bronson Curb, MSN, RNC-OB Diabetes Coordinator [phone] (8a-5p)

## 2018-04-20 NOTE — Consult Note (Signed)
Cardiology Consult    Patient ID: Katherine [name] MRN: [id number], DOB/AGE: [id number]   Admit date: 04/19/2018 Date of Consult: 04/20/2018  Primary Physician: [name], MD Primary Cardiologist: [name], MD Requesting Provider: Dr. Myna Hidalgo  Patient Profile    Meoshia Billing is a 62 y.o. female known to [facility] with a history of CAD with CABGx3 (LIMA-LAD, SVG-D1, SVG-RPDA.) followed by PCI x5 from [phone] as detailed in HPI below, PAD/Carotid disease, PAF, inf-post STEMI 1994, 2012 TIA/stroke, COPD, uncontrolled and insulin dependent DM2, HTN, HLD, morbid obesity s/p lap band surgery, OSA on CPAP and 2L night O2, and s/p 1991 renal transplant for ESRD dating back to 1991 who is being seen today for the evaluation of chest pain at the request of Dr. Myna Hidalgo.  Past Medical History   Past Medical History:  Diagnosis Date  . Anemia   . Anxiety   . Bilateral carotid artery stenosis    Carotid duplex 0/1601: 0-93% LICA, 23-55% RICA, >73% RECA, f/u 1 yr suggested  . CAD (coronary artery disease) of bypass graft 5/01; 3/'02, 8/'03, 10/'04; 1/15   PCI x 5 to SVG-D1   . CAD in native artery 07/1993    3 Vessel Disease (LAD-D1 & RCA) -- CABG  . CAD S/P percutaneous coronary angioplasty    PCI to SVG-D1 insertion/native D1 x 4 = '01 -(S660 BMS 2.5 x 9 - insertion into D1; '02 - distal overlap ACS Pixel 2.5 x 8  BMS; '03 distal/native ISR/Thrombosis - Pixel 2.5 x 13; '04 - ISR-  Taxus 2.5 x 20 (covered all);; 1/15 - mid SVG-D1 (50% distal ISR) - Promus P 2.75 x 20 -- 2.8 mm  . COPD mixed type ([place])    Followed by Dr. Lamonte Sakai "pulmonologist said no COPD"  . Depression   . Depression with anxiety   . Diabetes mellitus type 2 in obese ([place])   . Diarrhea    started after cholecystectomy and mass removed from intestine  . Dyslipidemia, goal LDL below 70    08/2012: TC 137, TG 200, HDL 32!, LDL 45; on statin (followed by Dr.Deterding)  . ESRD (end stage renal disease) ([place]) 1991   s/p  [facility]ada[facility] Renal Transplant [facility] - Dr. Jimmy Footman)   . Family history of adverse reaction to anesthesia    mom's bp dropped during/after anesthesia  . Fibromyalgia   . GERD (gastroesophageal reflux disease)   . Glomerulonephritis, chronic, rapidly progressive 74  . 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 07/1993   Rescue PTCA of RCA -- referred for CABG.  . H/O: GI bleed   . Headache    migraines in the past  . History of kidney stones   . Hypertension associated with diabetes ([place])   . MRSA (methicillin resistant staph aureus) culture positive   . Obesity    s/p Lap Band 04/2004- Port Replacement 10/09 & 2/10 for infection; Dr. Excell Seltzer.  . OSA (obstructive sleep apnea)    no longer on CPAP or home O2, states she doesn't need now after lap band  . PAD (peripheral artery disease) ([place]) 08/2013   LEA Dopplers to be read by Dr. Fletcher Anon  . PAF (paroxysmal atrial fibrillation) ([place]) 06/2014   Noted on CardioNet Monitor  - rate ~112; no recurrent symptoms. Nonionic regulation because of frequent GI procedures. Patient prefers Plavix  . Pneumonia   . Recurrent boils    Bilateral Groin  . Rheumatoid arthritis [facility])    Per Patient Report;  associated with OA  . 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 Servando Snare: LIMA-LAD, SVG-bifurcatingD1, SVG-rPDA  . S/p cadaver renal transplant [place]  . Stroke [facility]) 2012   "right eye stroke- half blind now"  . Unstable angina ([place]) 5/01; 3/'02, 8/'03, 10/'04; 1/15   x 5 occurences since Inf-Post STEMI in 1994    Past Surgical History:  Procedure Laterality Date  . CARDIAC CATHETERIZATION  5/'01, 3/'02, 8/'03, 10/'04; 1/'15   08/22/2013: LAD & RCA 100%; LIMA-LAD & SVG-rPDA patent; Cx-- OM1 60%, OM2 ostial ~50%; SVG-D1 - 80% mid, 50% distal ISR --PCI  . CATHETER REMOVAL    . CHOLECYSTECTOMY N/A 10/29/2014   Procedure: LAPAROSCOPIC CHOLECYSTECTOMY WITH INTRAOPERATIVE CHOLANGIOGRAM;  Surgeon: Excell Seltzer, MD;  Location: [facility] ORS;   Service: General;  Laterality: N/A;  . CORONARY ANGIOPLASTY  1994   x5  . CORONARY ARTERY BYPASS GRAFT  1995   LIMA-LAD, SVG-RPDA, SVG-D1  . ESOPHAGOGASTRODUODENOSCOPY N/A 10/15/2016   Procedure: ESOPHAGOGASTRODUODENOSCOPY (EGD);  Surgeon: Wilford Corner, MD;  Location: [facility] ENDOSCOPY;  Service: Endoscopy;  Laterality: N/A;  . I&D EXTREMITY Right 01/29/2018   Procedure: IRRIGATION AND DEBRIDEMENT THUMB;  Surgeon: Dayna Barker, MD;  Location: [place];  Service: Plastics;  Laterality: Right;  . INCISE AND DRAIN ABCESS    . KIDNEY TRANSPLANT  1991  . KNEE ARTHROSCOPY WITH LATERAL MENISECTOMY Left 12/03/2017   Procedure: LEFT KNEE ARTHROSCOPY WITH LATERAL MENISECTOMY;  Surgeon: Earlie Server, MD;  Location: [place];  Service: Orthopedics;  Laterality: Left;  . LAPAROSCOPIC GASTRIC BANDING  04/2004; 10/'09, 2/'10   Port Replacement x 2  . LEFT HEART CATHETERIZATION WITH CORONARY/GRAFT ANGIOGRAM N/A 08/23/2013   Procedure: LEFT HEART CATHETERIZATION WITH Beatrix Fetters;  Surgeon: Wellington Hampshire, MD;  Location: [place] CATH LAB;  Service: Cardiovascular;  Laterality: N/A;  . Lower Extremity Arterial Dopplers  08/2013   ABI: R 0.96, L 1.04  . MULTIPLE TOOTH EXTRACTIONS  age 74  . NM MYOVIEW LTD  03/2016   EF 62%. LOW RISK. C/W prior MI - no Ischemia. Apical hypokinesis.  [name] Kitchen PERCUTANEOUS CORONARY STENT INTERVENTION (PCI-S)  5/'01, 3/'02, 8/'03, 10/'04;   '01 - S660 BMS 2.5 x 9 - dSVG-D1 into D1; '02- post-stent stenosis - 2.5 x 8 Pixel BMS; '8\03: ISR/Thrombosis into native D1 - AngioJet, 2.5 x 13 Pixel; '04 - ISR 95% - covered stented area with Taxus DES 2.5 mm x 20 (2.88)  . PERCUTANEOUS CORONARY STENT INTERVENTION (PCI-S)  08/23/2013   Procedure: PERCUTANEOUS CORONARY STENT INTERVENTION (PCI-S);  Surgeon: Wellington Hampshire, MD;  Location: [facility] CATH LAB;  Service: Cardiovascular;;mid SVG-D1 80%; distal stent ~50% ISR; Promus Prermier DES 2.75 mm xc 20 mm (2.8 mm)  . PORT-A-CATH REMOVAL     kidney  .  TRANSTHORACIC ECHOCARDIOGRAM  02/2016   EF 55-60%. Septal dyssynergy from CABG GR 2 DD. Aortic valve is trileaflet the functional bicuspid with sclerosis but not yet notable for stenosis stenosis.; no Significant change  . TUBAL LIGATION    . wrist fistula repair Left    dialysis for one year     Allergies  Allergies  Allergen Reactions  . Tetracycline Hives  . Hydromorphone Hcl Nausea And Vomiting  . Niacin Other (See Comments)    Mouth blisters  . Niaspan [Niacin Er] Other (See Comments)    Mouth blisters  . Sulfa Antibiotics Nausea Only and Other (See Comments)    "Tears up stomach"  . Sulfonamide Derivatives Other (See Comments)  Reaction: per patient "tears her stomach up"  . Codeine Nausea And Vomiting  . Erythromycin Nausea And Vomiting  . Hydromorphone Hcl Nausea And Vomiting  . Morphine And Related Nausea And Vomiting  . Nalbuphine Nausea And Vomiting  . Sulfasalazine Nausea Only and Other (See Comments)    per patient "tears her stomach up", "Tears up stomach"  . Tape Rash and Other (See Comments)    No "plastic" tape," please    History of Present Illness    62 yo female and former patient of Dr. Terance Ice, also followed by Dr. Jimmy Footman, with a long history of CAD and the above listed in patient profile.   Her cardiac history dates back to 1994 and includes inferior - posterior STEMI found to have three-vessel disease involving LAD-D1 bifurcation as well as RCA. After initially having PTCA of the RCA, she eventually went for for CABGx3: LIMA-LAD, SVG-D1, SVG-RPDA. Since 1994, she has had several heart catheterizations for symptoms concerning for unstable angina -in 2001, 2002, 2003 than 2004 all with lesions in the SVG-D1 with initial stents following bypass followed by stent thrombosis. Most recent Cath-PCI for UA was in January 2015: Native LAD and RCA 100% occluded.  LIMA-LAD and SVG-RPDA patent.  In the Circumflex, OM1 had 60% and OM 2 ostial 50% with  normal OM 3.  SVG-D1 had 80% mid and 50% distal ISR --> 1- PCI with a Promus Premier DES 2.75 mm x 20 mm.   Myoview August 2017 for preop showed: EF 60% with small anteroapical infarct but no ischemia and mild apical hypokinesis. Echo July 2017 showed normal LV size and function and post CABG septal hypokinesis with EF 55-60%, GRII DD, as well as functionally bicuspid AoV -but no significant stenosis.  (Mean-peak gradient 9-16 mmHg). Most recent 10/2017 echo showed moderate left ventricular hypertrophy with grade 2 diastolic dysfunction goes along with high blood pressure. Normal pump function of 55-60%. Normal wall motion. The aortic valve was calcified with mild stenosis.Likely calcification of the commissures. It was thought at that time that there was probably no need to follow-up every year and instead decided to follow-up every 2 years.  On 04/19/2018, patient presented to [facility] per EMS from home with generalized and bilateral burning chest pain at rest that radiated to the bilateral jaw. She describes this CP as similar to that in the past and "like my heart attack pain." She stated this CP occurred around 5pm and immediately after eating. Associated symptorms included palpitations, feeling as if her heart was racing, nausea and diaphoresis. She took 324mg  ASA, and 1 expired nitro with alleviation of pain rated as decreasing from 8/10 pain to 5/10 pain. After 2 nitro, she still reported chest pain as 5/10 in severity.  She reports that the the jaw pain persisted for some time after alleviation of the chest pain and finally subsided while in the ED.  Upon arrival to the ED, patient was found to be afebrile at T 97.34F, saturating well on room air at 97% with RR 20, HR 69-108, and with blood pressure 110/78.  EKG featured atrial fibrillation with lateral T wave abnormalities.  Chest x-ray was negative for acute cardiopulmonary disease.  Chemistry panel was notable for glucose of 218 and Cr  0.92.  Na 139, K 4.0. CBC showed slight anemia with Hgb 11.2, HCT 35.7. Initial troponin 0.03. Ddimer 0.63. Of note, subsequent troponin negative. Patient was given 10 mg IV Cardizem in the ED for atrial fibrillation with  subsequent reduction of heart rate to 60-80s.  She was also given a dose of fentanyl and cardiology was consulted by the ED physician, recommended medical admission. She was admitted while hemodynamically stable with mild ongoing chest discomfort, and will be observed for ongoing evaluation and management of this.  Home Plavix 75mg , 20mg  rosuvastatin, diltiazem 120 mg, lopressor table 25mg  bid.  Prior to Admission medications   Medication Sig Start Date End Date Taking? Authorizing Provider  acetaminophen (TYLENOL) 500 MG tablet Take 1 tablet (500 mg total) by mouth every 6 (six) hours as needed for moderate pain or headache. Patient taking differently: Take 1,000 mg by mouth every 6 (six) hours as needed for moderate pain or headache.  10/17/16  Yes Arrien, Jimmy Picket, MD  albuterol (VENTOLIN HFA) 108 (90 Base) MCG/ACT inhaler INHALE 2 PUFFS INTO THE LUNGS EVERY 6 (SIX) HOURS AS NEEDED FOR WHEEZING OR SHORTNESS OF BREATH. Patient taking differently: Inhale 2 puffs into the lungs every 6 (six) hours as needed for wheezing or shortness of breath.  06/14/17  Yes Collene Gobble, MD  azaTHIOprine (IMURAN) 50 MG tablet Take 125 mg by mouth every evening.    Yes [provider]  budesonide-formoterol (SYMBICORT) 160-4.5 MCG/ACT inhaler Inhale 2 puffs into the lungs 2 (two) times daily. 10/07/17  Yes Collene Gobble, MD  calcitRIOL (ROCALTROL) 0.25 MCG capsule Take 0.25 mcg by mouth every 3 (three) days.    Yes [provider]  carbamazepine (TEGRETOL) 200 MG tablet TAKE 200 MG BY MOUTH TWICE A DAY Patient taking differently: Take 200 mg by mouth 2 (two) times daily.  03/11/18  Yes Bayard Hugger, NP  clopidogrel (PLAVIX) 75 MG tablet TAKE 1 TABLET BY MOUTH EVERY  DAY Patient taking differently: Take 75 mg by mouth daily.  02/14/18  Yes [name], MD  clotrimazole [facility]) 10 MG troche Take 1 tablet (10 mg total) by mouth 5 (five) times daily. Patient taking differently: Take 10 mg by mouth daily as needed (thrush).  10/21/16  Yes Domenic Polite, MD  cyclobenzaprine (FLEXERIL) 10 MG tablet TAKE 1 TABLET BY MOUTH 3 TIMES DAILY AS NEEDED FOR MUSCLE SPASMS Patient taking differently: Take 10 mg by mouth 2 (two) times daily.  10/19/17  Yes Meredith Staggers, MD  diltiazem (CARDIZEM CD) 120 MG 24 hr capsule TAKE 1 CAPSULE (120 MG TOTAL) BY MOUTH DAILY. NEED OV. Patient taking differently: Take 120 mg by mouth daily.  02/22/17  Yes [name], MD  DULoxetine (CYMBALTA) 30 MG capsule TAKE 2 CAPSULES (60 MG TOTAL) BY MOUTH EVERY MORNING. 01/26/18  Yes Meredith Staggers, MD  famotidine (PEPCID) 20 MG tablet Take 20 mg by mouth daily. 05/19/17  Yes [provider]  ferrous sulfate 325 (65 FE) MG tablet Take 1 tablet (325 mg total) by mouth 3 (three) times daily with meals. Patient taking differently: Take 325 mg by mouth 2 (two) times daily.  10/17/16  Yes Arrien, Jimmy Picket, MD  fluticasone [facility]) 50 MCG/ACT nasal spray Place 2 sprays into both nostrils 2 (two) times daily. 12/13/17  Yes Collene Gobble, MD  glimepiride (AMARYL) 4 MG tablet Take 4 mg by mouth every morning. 04/20/16  Yes [provider]  guaiFENesin (MUCINEX) 600 MG 12 hr tablet Take 1 tablet (600 mg total) by mouth 2 (two) times daily. Patient taking differently: Take 600 mg by mouth 2 (two) times daily as needed for cough or to loosen phlegm.  02/02/18  Yes [place] Pink,  Gokul, MD  Insulin Glargine (BASAGLAR KWIKPEN) 100 UNIT/ML SOPN Inject 12 Units into the skin daily.   Yes [provider]  loperamide (IMODIUM A-D) 2 MG tablet Take 4 mg by mouth 2 (two) times daily.   Yes [provider]  LORazepam (ATIVAN) 1 MG tablet Take 1 mg by mouth every morning.     Yes [provider]  metFORMIN (GLUCOPHAGE) 500 MG tablet Take 500 mg by mouth 2 (two) times daily. 10/03/16  Yes [provider]  metoprolol tartrate (LOPRESSOR) 25 MG tablet Take 1 tablet (25 mg total) by mouth 2 (two) times daily. 03/17/18  Yes [name], MD  montelukast (SINGULAIR) 10 MG tablet TAKE 1 TABLET BY MOUTH EVERY DAY 01/11/18  Yes Byrum, Rose Fillers, MD  nitroGLYCERIN (NITROSTAT) 0.4 MG SL tablet Place 1 tablet (0.4 mg total) under the tongue every 5 (five) minutes as needed for chest pain. 09/11/13  Yes [name], MD  NOVOLOG FLEXPEN 100 UNIT/ML FlexPen Inject 4 Units into the skin 2 (two) times daily as needed for high blood sugar.  12/13/17  Yes [provider]  omeprazole (PRILOSEC) 40 MG capsule Take 40 mg by mouth every morning.    Yes [provider]  oxyCODONE (OXY IR/ROXICODONE) 5 MG immediate release tablet Take 1 tablet (5 mg total) by mouth every 8 (eight) hours as needed for severe pain. 02/14/18  Yes Bayard Hugger, NP  predniSONE (DELTASONE) 5 MG tablet Take 5 mg by mouth daily with breakfast.   Yes [provider]  promethazine (PHENERGAN) 25 MG tablet Take 1 tablet (25 mg total) by mouth every 6 (six) hours as needed for nausea or vomiting (or sleep). 02/03/18  Yes Deno Etienne, DO  rosuvastatin (CRESTOR) 20 MG tablet Take 1 tablet (20 mg total) by mouth daily. 10/25/17  Yes [name], MD  sertraline (ZOLOFT) 100 MG tablet Take 100 mg by mouth every morning.   Yes [provider]  erythromycin base (E-MYCIN) 500 MG tablet Take 500 mg by mouth daily.    [provider]     Inpatient Medications    . azaTHIOprine  125 mg Oral QPM  . calcitRIOL  0.25 mcg Oral Q3 days  . carbamazepine  200 mg Oral BID  . clopidogrel  75 mg Oral Daily  . cyclobenzaprine  10 mg Oral BID  . diltiazem  120 mg Oral Daily  . DULoxetine  60 mg Oral q morning - 10a  . erythromycin base  500 mg Oral Daily  . famotidine   20 mg Oral Daily  . insulin aspart  0-9 Units Subcutaneous Q4H  . insulin glargine  8 Units Subcutaneous QHS  . LORazepam  1 mg Oral q morning - 10a  . metoprolol tartrate  25 mg Oral BID  . mometasone-formoterol  2 puff Inhalation BID  . montelukast  10 mg Oral QHS  . pantoprazole  40 mg Oral Daily  . predniSONE  5 mg Oral Q breakfast  . rosuvastatin  20 mg Oral q1800  . sertraline  100 mg Oral q morning - 10a    Family History    Family History  Problem Relation Age of Onset  . Cancer Mother        liver  . Heart disease Father   . Cancer Father        colon  . Arrhythmia Brother        Atrial Fibrillation  . Arrhythmia Paternal Aunt  Atrial Fibrillation   She indicated that her mother is deceased. She indicated that her father is deceased. She indicated that her sister is alive. She indicated that only one of her three brothers is alive. She indicated that the status of her paternal aunt is unknown.   Social History    Social History   Socioeconomic History  . Marital status: Married    Spouse name: Not on file  . Number of children: Not on file  . Years of education: Not on file  . Highest education level: Not on file  Occupational History  . Not on file  Social Needs  . Financial resource strain: Not on file  . Food insecurity:    Worry: Not on file    Inability: Not on file  . Transportation needs:    Medical: Not on file    Non-medical: Not on file  Tobacco Use  . Smoking status: Former Smoker    Packs/day: 1.00    Years: 30.00    Pack years: 30.00    Types: Cigarettes    Last attempt to quit: 08/17/2002    Years since quitting: 15.6  . Smokeless tobacco: Never Used  Substance and Sexual Activity  . Alcohol use: No  . Drug use: No  . Sexual activity: Not on file  Lifestyle  . Physical activity:    Days per week: Not on file    Minutes per session: Not on file  . Stress: Not on file  Relationships  . Social connections:    Talks on  phone: Not on file    Gets together: Not on file    Attends religious service: Not on file    Active member of club or organization: Not on file    Attends meetings of clubs or organizations: Not on file    Relationship status: Not on file  . Intimate partner violence:    Fear of current or ex partner: Not on file    Emotionally abused: Not on file    Physically abused: Not on file    Forced sexual activity: Not on file  Other Topics Concern  . Not on file  Social History Narrative   She is currently married, and the caregiver of her husband who is recovering from surgery for tongue cancer now diagnosed with lung cancer. Prior to his diagnosis of her husband, she actually had adopted a 65-year-old child who she knows caring for as well. With all the surgeries, they have been quite financially troubled. Thanks the help of her community and church, they have been able to stay "alfoat."     She is a former smoker who quit in 2004 after a 30-pack-year history.   She is active chasing a 54-year-old child, does not do routine exercise. She's been quite depressed with the condition of her husband, and admits to eating comfort herself.   She does not drink alcohol.     Review of Systems    General:  No chills, fever, night sweats. Pt reports losing 2 lbs in the past couple of days.  Cardiovascular:  No current chest pain or radiation, dyspnea on exertion, edema, orthopnea, palpitations, paroxysmal nocturnal dyspnea. Dermatological: No rash, lesions/masses. 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 Does report hoarseness (recently started) Urologic: No hematuria, dysuria Abdominal:   No nausea, vomiting, diarrhea, bright red blood per rectum, melena, or hematemesis Neurologic:  No visual changes, wkns, changes in mental status. All  other systems reviewed and are otherwise negative except as noted above.  Physical Exam    Blood pressure  128/69, pulse 65, temperature 98.5 F (36.9 C), temperature source Oral, resp. rate 17, height 5\' 1"  (1.549 m), weight 104.7 kg, SpO2 98 %.  General: Pleasant, NAD. Lying in bed Psych: Normal affect. Neuro: Alert and oriented X 3. Moves all extremities spontaneously. HEENT: Normal  Neck: Supple without bruits despite carotid artery disease. JVD difficult to assess d/t body habitus but not noted on exam.  Lungs:  Resp regular and unlabored, CTA. Heart: RRR (converted to SR) no s3, s4, or murmurs. Abdomen: Obese, Soft, non-tender, distended, BS + x 4.  Extremities: No clubbing, cyanosis or edema. DP/PT/Radials 2+ and equal bilaterally. 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  Labs    Troponin [facility] of Care Test) Recent Labs    04/19/18 1846  TROPIPOC 0.02   Recent Labs    04/19/18 2121 04/20/18 0352  TROPONINI 0.03* <0.03   Lab Results  Component Value Date   WBC 5.5 04/19/2018   HGB 11.2 (L) 04/19/2018   HCT 35.7 (L) 04/19/2018   MCV 98.9 04/19/2018   PLT 223 04/19/2018    Recent Labs  Lab 04/20/18 0352  NA 139  K 4.6  CL 107  CO2 23  BUN 14  CREATININE 0.87  CALCIUM 8.9  GLUCOSE 185*   Lab Results  Component Value Date   CHOL 111 11/30/2017   HDL 53.90 11/30/2017   LDLCALC 32 11/30/2017   TRIG 129.0 11/30/2017   Lab Results  Component Value Date   DDIMER (H) 06/04/2008    0.63        AT THE INHOUSE ESTABLISHED CUTOFF VALUE OF 0.48 ug/mL FEU, THIS ASSAY HAS BEEN DOCUMENTED IN THE LITERATURE TO HAVE     Radiology Studies    Dg Chest 2 View  Result Date: 04/19/2018 CLINICAL DATA:  Chest pain. EXAM: CHEST - 2 VIEW COMPARISON:  Chest x-ray dated February 03, 2018. FINDINGS: The patient is rotated to the left. Stable cardiomegaly status post CABG. Normal pulmonary vascularity. No focal consolidation, pleural effusion, or pneumothorax. No acute osseous abnormality. Multiple broken median sternotomy wires are  unchanged. IMPRESSION: Stable cardiomegaly.  No active cardiopulmonary disease. Electronically Signed   By: Titus Dubin M.D.   On: 04/19/2018 18:31    ECG & Cardiac Imaging    10/27/2017 TTE Left ventricle: The cavity size was normal. Wall thickness was   increased in a pattern of moderate LVH. Systolic function was   normal. The estimated ejection fraction was in the range of 55%   to 60%. Wall motion was normal; there were no regional wall   motion abnormalities. Features are consistent with a pseudonormal   left ventricular filling pattern, with concomitant abnormal   relaxation and increased filling pressure (grade 2 diastolic   dysfunction). - Aortic valve: Mildly calcified annulus. Moderately thickened,   moderately calcified leaflets. There was mild stenosis. Valve   area (VTI): 1.93 cm^2. Valve area (Vmax): 1.9 cm^2. Valve area   (Vmean): 1.79 cm^2.  03/19/18 Myocardial perfusion with Lexiscan  Nuclear stress EF: 62%.  There was no ST segment deviation noted during stress.  Findings consistent with prior myocardial infarction.  This is a low risk study.  The left ventricular ejection fraction is normal (55-65%).   Small anteroapical infarct with no ischemia EF 62% mild apical hypokinesis  02/2016 Carotids  Stable heterogeneous plaque, with  shadowing, bilaterally. Progression of RICA stenosis, high end of 40-59% range. Stable 4-09% LICA stenosis. >50% RECA stenosis.  Normal subclavian arteries, bilaterally.  Patent vertebral arteries with antegrade flow  Assessment & Plan    Chest pain with initial troponin elevation x1 and in the setting of CAD s/p CABGx3 and PCIx5 - No current CP or associated sx. Describes pain as "similar to my heart attack pain."  - Converted by to NSR by exam (presented to ED in Afib). - See above for extensive cardiac history dating back to 1994.  - Most recent echo 10/2017 with preserved EF as above.  - Initial troponin 0.03 with  subsequent troponin negative. DDmier 0.63   - EKG as above in history and unchanged from prior. CXR without acute findings.  - Serial troponins and repeat EKGs. Continue. - Started on IV heparin. Continue.  - Continue home Plavix 75mg , 20mg  rosuvastatin statin therapy, diltiazem 120 mg, and lopressor tablet 25mg  bid. - Will need to stop Plavix 5-7d prior to any surgical procedures.  - Daily CBC as on heparin - Daily BMET to monitor renal function as s/p renal transplant - Further evaluation per MD. Angina at rest is concerning - may be associated with rapid HR d/t Afib with RVR but given patient history, consider further ischemic evaluation as patient with known CAD and at high risk for further disease.   Chronic PAF, with telemetry this admission showing Afib and Aflutter - CHA2DS2VASc score of at least 6 (HTN, DM, TIA/stroke x2, CAD and PAD / vascular disease, gender). - Anticoagulation recommended given risk for score above and risk for stroke.   - In PAF on presentation with documented rate in the 120s. Converted back to NSR. - Continue home CCB, BB - Continue to monitor rate and titrate BB / CCB as needed and as HR and BP allow  HTN - Stable with systolic [id number] and diastolic 78-295 - Now NSR. At times brady with HR 50s-60s - Continue to monitor and titrate the above medications if needed and if HR allows  HLD in the setting of known CAD, PAD, and carotid artery disease - 12/2017 lipid panel with LDL 34 and at goal  - Most recent carotid artery study as above  Insulin dependent DM2 - SSI - Per IM  S/p renal transplant - Renal function stable with Cr at baseline as above and 0.87 - Per IM  Remainder per IM  For questions or updates, please contact [facility] Please consult [email or web address] for contact info under Cardiology/STEMI.      Dorthula Nettles, PA-C  Pager [phone] 04/20/2018, 8:04 AM   As above, patient seen and examined.  Briefly she is a  62 year old female with past medical history of coronary artery disease status post coronary artery bypass and graft, paroxysmal atrial fibrillation, hypertension, diabetes mellitus, hyperlipidemia, prior renal transplant with normal creatinine for evaluation of paroxysmal atrial fibrillation/flutter and chest pain.  Patient has limited mobility because of arthritis and peripheral neuropathy.  However she typically does not have exertional chest pain.  She does have some dyspnea on exertion.  Approximately 1 time monthly she will have palpitations associated with chest and jaw tightness typically resolving after metoprolol.  Yesterday she had an episode of palpitations that did not resolve.  She then developed chest tightness radiating to her jaw similar to her previous cardiac pain.  Her symptoms did not resolve and she presented to the emergency room.  Note she did take to metoprolol and has  her heart rate improved her chest pain improved.  Cardiology now asked to evaluate. Electrocardiogram showed probable atrial fibrillation with rate related left bundle branch block.  Enzymes negative.  Creatinine 0.87.  1 paroxysmal atrial fibrillation/flutter-patient carries a diagnosis of atrial fibrillation.  In reviewing telemetry during this hospitalization there also appears to be atrial flutter.  She has converted to sinus rhythm.  We will continue metoprolol and Cardizem for rate control.  However we cannot advance as her heart rate in sinus is in the 50s.  She is having episodes approximately 1 time monthly by her report though this was the most severe episode.  She would therefore likely benefit from an antiarrhythmic versus ablation.  Issue is further complicated by history of renal transplant with potential interaction of Tikosyn or amiodarone with other medications.  She would not be a candidate for flecainide given coronary artery disease.  I will ask electrophysiology to review. CHADSvasc 6.  She would  benefit from anticoagulation long-term.  She has a history of GI bleeding but had an intestinal tumor taken out with no recurrent bleeding by report.  She also falls 1 time monthly because of instability/peripheral neuropathy.  However she does not fall when she uses her cane or walker.  I would favor apixaban long-term.  This can be initiated following cardiac catheterization.  Note TSH is normal.  2 angina-patient has significant angina with elevated heart rate.  We will proceed with cardiac catheterization tomorrow.  The risks and benefits including myocardial infarction, CVA and death discussed and she agrees to proceed.  3 coronary artery disease-continue Plavix and statin for now.  4 hypertension-blood pressure is controlled at present.  Continue present medications and follow.  5 history of renal transplant-follow renal function closely while in hospital.  Kirk Ruths, MD

## 2018-04-20 NOTE — Progress Notes (Signed)
   Received call from pharmacist regarding interaction with carbamazepine and Eliquis. Initial plan (following today's consultation )was to start the patient on anticoagulation with Eliquis s/p heart catheterization tomorrow 9/5; however, due to the interaction, we have now cancelled this order.   Discussed this with Dr. Stanford Breed and he is aware of interaction between carbamazepine and Eliquis.    We will revisit the plan for anticoagulation tomorrow.  Arvil Chaco, PA-C  04/20/2018 2:17 PM   .

## 2018-04-20 NOTE — Consult Note (Addendum)
Cardiology Consultation:   Patient ID: Katherine [name]; [id number]; Mar 20, 1956   Admit date: 04/19/2018 Date of Consult: 04/20/2018  Primary Care Provider: [name], MD Primary Cardiologist: Katherine Hew, MD  Primary Electrophysiologist:  new   Patient Profile:   Katherine [name] is a 62 y.o. female with a hx of CAD/CABG/PCI's ([name]-LAD, SVG-D1, SVG-RPDA, followed by PCI x5 from [id number]), PVD w/carotid disease, PAF, CVA, COPD, uncontrolled DM II on insulin, HTN, HLD, obesity (hx of lap-band), OSA w/CPAP, ESRF on HD >> kidney transplant 1991 (on anti-rejection meds)who is being seen today for the evaluation of AFib at the request of Katherine [name].  History of Present Illness:   Katherine [name] sought medical attention yesterday at [facility] with CP, as well as palpitations, SOB, nausea/diaphoresis.  She took an ASA at home, an expired s/l NTG without relief, took a second and came in.  She was found in AFib w/FVR treated with diltiazem and a dose of fentanyl, with reduction in HR.  She is planned for [facility] tomorrow given her pain was reported to be reminiscent of her prior cardiac/coronary symptoms, started on heparin gtt with plans for likely Eliquis post cath for a/c (not on historically)  EP is asked to weigh in regarding her AFib, with monthly bouts of palpitations, baseline SB (on diltiazem 120mg  daily, metoprolol tart 25mg  BID).  The patient reports palpitations that go back to her youth, she can not recall when she was formally diagnosed with the AFib thought has been part of her history for years.  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  She doesn't think she ever was tried on a/c again.  Her palpitations are a feeling of erratic heart beat, they can last hours to a couple days, longer episodes make her feel very tired.  She has a hard time with frequency over the years, but reports in the last several months  at least more frequent then usual, maybe once a month.  Yesterday she did not initially feel palpitations, says the onset of symptoms was her CP, not until here did become aware of an irregular heart beat.  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 (she is immunosuppressed with her Imuran and prednisone)  LABS K+ 4.6 BUN/Creat 14/0.87 Trop I: 0.03 (x3) WBC 5.5 H/H 11/35 Plts 223 TSH 3.118    Past Medical History:  Diagnosis Date  . Anemia   . Anxiety   . Bilateral carotid artery stenosis    Carotid duplex 08/9377: 0-24% LICA, 09-73% RICA, >53% RECA, f/u 1 yr suggested  . CAD (coronary artery disease) of bypass graft 5/01; 3/'02, 8/'03, 10/'04; 1/15   PCI x 5 to SVG-D1   . CAD in native artery 07/1993    3 Vessel Disease (LAD-D1 & RCA) -- CABG  . CAD S/P percutaneous coronary angioplasty    PCI to SVG-D1 insertion/native D1 x 4 = '01 -(S660 BMS 2.5 x 9 - insertion into D1; '02 - distal overlap ACS Pixel 2.5 x 8  BMS; '03 distal/native ISR/Thrombosis - Pixel 2.5 x 13; '04 - ISR-  Taxus 2.5 x 20 (covered all);; 1/15 - mid SVG-D1 (50% distal ISR) - Promus P 2.75 x 20 -- 2.8 mm  . COPD  mixed type ([place])    Followed by Dr. Lamonte [name] "pulmonologist said no COPD"  . Depression   . Depression with anxiety   . Diabetes mellitus type 2 in obese ([place])   . Diarrhea    started after cholecystectomy and mass removed from intestine  . Dyslipidemia, goal LDL below 70    08/2012: TC 137, TG 200, HDL 32!, LDL 45; on statin (followed by Katherine [name])  . ESRD (end stage renal disease) ([place]) 1991   s/p Cadaveric Renal Transplant [facility] - Dr. Jimmy [name])   . Family history of adverse reaction to anesthesia    mom's bp dropped during/after anesthesia  . Fibromyalgia   . GERD (gastroesophageal reflux disease)   .  Glomerulonephritis, chronic, rapidly progressive 73  . 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 07/1993   Rescue PTCA of RCA -- referred for CABG.  . H/O: GI bleed   . Headache    migraines in the past  . History of kidney stones   . Hypertension associated with diabetes (Katherine [name])   . MRSA (methicillin resistant staph aureus) culture positive   . Obesity    s/p Lap Band 04/2004- Port Replacement 10/09 & 2/10 for infection; Dr. Excell [name].  . OSA (obstructive sleep apnea)    no longer on CPAP or home O2, states she doesn't need now after lap band  . PAD (peripheral artery disease) ( [place]) 08/2013   LEA Dopplers to be read by Dr. Fletcher [name]  . PAF (paroxysmal atrial fibrillation) ([place]) 06/2014   Noted on CardioNet Monitor  - rate ~112; no recurrent symptoms. Nonionic regulation because of frequent GI procedures. Patient prefers Plavix  . Pneumonia   . Recurrent boils    Bilateral Groin  . Rheumatoid arthritis ([place])    Per Patient Report; associated with OA  . S/P CABG x 3 08/1993   Katherine [name]: [name]-LAD, SVG-bifurcatingD1, SVG-rPDA  . S/p cadaver renal transplant [place]  . Stroke [facility]) 2012   "right eye stroke- half blind now"  . Unstable angina ([place]) 5/01; 3/'02, 8/'03, 10/'04; 1/15   x 5 occurences since Inf-Post STEMI in 1994    Past Surgical History:  Procedure Laterality Date  . CARDIAC CATHETERIZATION  5/'01, 3/'02, 8/'03, 10/'04; 1/'15   08/22/2013: LAD & RCA 100%; [name]-LAD & SVG-rPDA patent; Cx-- OM1 60%, OM2 ostial ~50%; SVG-D1 - 80% mid, 50% distal ISR --PCI  . CATHETER REMOVAL    . CHOLECYSTECTOMY N/A 10/29/2014   Procedure: LAPAROSCOPIC CHOLECYSTECTOMY WITH INTRAOPERATIVE CHOLANGIOGRAM;  Surgeon: Katherine Seltzer, MD;  Location: [facility] ORS;  Service: General;  Laterality: N/A;  . CORONARY ANGIOPLASTY  1994   x5  . CORONARY ARTERY BYPASS GRAFT  1995   [name]-LAD, SVG-RPDA, SVG-D1  . ESOPHAGOGASTRODUODENOSCOPY N/A 10/15/2016   Procedure:  ESOPHAGOGASTRODUODENOSCOPY (EGD);  Surgeon: Wilford Corner, MD;  Location[facility] ENDOSCOPY;  Service: Endoscopy;  Laterality: N/A;  . I&D EXTREMITY Right 01/29/2018   Procedure: IRRIGATION AND DEBRIDEMENT THUMB;  Surgeon: Dayna Barker, MD;  Location: [place];  Service: Plastics;  Laterality: Right;  . INCISE AND DRAIN ABCESS    . KIDNEY TRANSPLANT  1991  . KNEE ARTHROSCOPY WITH LATERAL MENISECTOMY Left 12/03/2017   Procedure: LEFT KNEE ARTHROSCOPY WITH LATERAL MENISECTOMY;  Surgeon: Earlie Server, MD;  Location: [place];  Service: Orthopedics;  Laterality: Left;  . LAPAROSCOPIC GASTRIC BANDING  04/2004; 10/'09, 2/'10   Port Replacement x 2  . LEFT HEART CATHETERIZATION WITH CORONARY/GRAFT ANGIOGRAM N/A 08/23/2013   Procedure: LEFT HEART CATHETERIZATION  WITH Beatrix Fetters;  Surgeon: Wellington Hampshire, MD;  Location: [facility] CATH LAB;  Service: Cardiovascular;  Laterality: N/A;  . Lower Extremity Arterial Dopplers  08/2013   ABI: R 0.96, L 1.04  . MULTIPLE TOOTH EXTRACTIONS  age 88  . NM MYOVIEW LTD  03/2016   EF 62%. LOW RISK. C/W prior MI - no Ischemia. Apical hypokinesis.  [name] Kitchen PERCUTANEOUS CORONARY STENT INTERVENTION (PCI-S)  5/'01, 3/'02, 8/'03, 10/'04;   '01 - S660 BMS 2.5 x 9 - dSVG-D1 into D1; '02- post-stent stenosis - 2.5 x 8 Pixel BMS; '8\03: ISR/Thrombosis into native D1 - AngioJet, 2.5 x 13 Pixel; '04 - ISR 95% - covered stented area with Taxus DES 2.5 mm x 20 (2.88)  . PERCUTANEOUS CORONARY STENT INTERVENTION (PCI-S)  08/23/2013   Procedure: PERCUTANEOUS CORONARY STENT INTERVENTION (PCI-S);  Surgeon: Wellington Hampshire, MD;  Location: [facility] CATH LAB;  Service: Cardiovascular;;mid SVG-D1 80%; distal stent ~50% ISR; Promus [place] DES 2.75 mm xc 20 mm (2.8 mm)  . PORT-A-CATH REMOVAL     kidney  . TRANSTHORACIC ECHOCARDIOGRAM  02/2016   EF 55-60%. Septal dyssynergy from CABG GR 2 DD. Aortic valve is trileaflet the functional bicuspid with sclerosis but not yet notable for stenosis stenosis.; no  Significant change  . TUBAL LIGATION    . wrist fistula repair Left    dialysis for one year     Home Medications:  Prior to Admission medications   Medication Sig Start Date End Date Taking? Authorizing Provider  acetaminophen (TYLENOL) 500 MG tablet Take 1 tablet (500 mg total) by mouth every 6 (six) hours as needed for moderate pain or headache. Patient taking differently: Take 1,000 mg by mouth every 6 (six) hours as needed for moderate pain or headache.  10/17/16  Yes Arrien, Katherine Picket, MD  albuterol (VENTOLIN HFA) 108 (90 Base) MCG/ACT inhaler INHALE 2 PUFFS INTO THE LUNGS EVERY 6 (SIX) HOURS AS NEEDED FOR WHEEZING OR SHORTNESS OF BREATH. Patient taking differently: Inhale 2 puffs into the lungs every 6 (six) hours as needed for wheezing or shortness of breath.  06/14/17  Yes Collene Gobble, MD  azaTHIOprine (IMURAN) 50 MG tablet Take 125 mg by mouth every evening.    Yes [provider]  budesonide-formoterol (SYMBICORT) 160-4.5 MCG/ACT inhaler Inhale 2 puffs into the lungs 2 (two) times daily. 10/07/17  Yes Collene Gobble, MD  calcitRIOL (ROCALTROL) 0.25 MCG capsule Take 0.25 mcg by mouth every 3 (three) days.    Yes [provider]  carbamazepine (TEGRETOL) 200 MG tablet TAKE 200 MG BY MOUTH TWICE A DAY Patient taking differently: Take 200 mg by mouth 2 (two) times daily.  03/11/18  Yes Bayard Hugger, NP  clopidogrel (PLAVIX) 75 MG tablet TAKE 1 TABLET BY MOUTH EVERY DAY Patient taking differently: Take 75 mg by mouth daily.  02/14/18  Yes [name], MD  clotrimazole [facility]) 10 MG troche Take 1 tablet (10 mg total) by mouth 5 (five) times daily. Patient taking differently: Take 10 mg by mouth daily as needed (thrush).  10/21/16  Yes Domenic Polite, MD  cyclobenzaprine (FLEXERIL) 10 MG tablet TAKE 1 TABLET BY MOUTH 3 TIMES DAILY AS NEEDED FOR MUSCLE SPASMS Patient taking differently: Take 10 mg by mouth 2 (two) times daily.  10/19/17  Yes Meredith Staggers,  MD  diltiazem (CARDIZEM CD) 120 MG 24 hr capsule TAKE 1 CAPSULE (120 MG TOTAL) BY MOUTH DAILY. NEED OV. Patient taking differently: Take 120 mg by  mouth daily.  02/22/17  Yes [name], MD  DULoxetine (CYMBALTA) 30 MG capsule TAKE 2 CAPSULES (60 MG TOTAL) BY MOUTH EVERY MORNING. 01/26/18  Yes Meredith Staggers, MD  famotidine (PEPCID) 20 MG tablet Take 20 mg by mouth daily. 05/19/17  Yes [provider]  ferrous sulfate 325 (65 FE) MG tablet Take 1 tablet (325 mg total) by mouth 3 (three) times daily with meals. Patient taking differently: Take 325 mg by mouth 2 (two) times daily.  10/17/16  Yes Arrien, Katherine Picket, MD  fluticasone [facility]) 50 MCG/ACT nasal spray Place 2 sprays into both nostrils 2 (two) times daily. 12/13/17  Yes Collene Gobble, MD  glimepiride (AMARYL) 4 MG tablet Take 4 mg by mouth every morning. 04/20/16  Yes [provider]  guaiFENesin (MUCINEX) 600 MG 12 hr tablet Take 1 tablet (600 mg total) by mouth 2 (two) times daily. Patient taking differently: Take 600 mg by mouth 2 (two) times daily as needed for cough or to loosen phlegm.  02/02/18  Yes Bonnielee Haff, MD  Insulin Glargine (BASAGLAR KWIKPEN) 100 UNIT/ML SOPN Inject 12 Units into the skin daily.   Yes [provider]  loperamide (IMODIUM A-D) 2 MG tablet Take 4 mg by mouth 2 (two) times daily.   Yes [provider]  LORazepam (ATIVAN) 1 MG tablet Take 1 mg by mouth every morning.    Yes [provider]  metFORMIN (GLUCOPHAGE) 500 MG tablet Take 500 mg by mouth 2 (two) times daily. 10/03/16  Yes [provider]  metoprolol tartrate (LOPRESSOR) 25 MG tablet Take 1 tablet (25 mg total) by mouth 2 (two) times daily. 03/17/18  Yes [name], MD  montelukast (SINGULAIR) 10 MG tablet TAKE 1 TABLET BY MOUTH EVERY DAY 01/11/18  Yes Byrum, Rose Fillers, MD  nitroGLYCERIN (NITROSTAT) 0.4 MG SL tablet Place 1 tablet (0.4 mg total) under the tongue every 5 (five) minutes as  needed for chest pain. 09/11/13  Yes [name], MD  NOVOLOG FLEXPEN 100 UNIT/ML FlexPen Inject 4 Units into the skin 2 (two) times daily as needed for high blood sugar.  12/13/17  Yes [provider]  omeprazole (PRILOSEC) 40 MG capsule Take 40 mg by mouth every morning.    Yes [provider]  oxyCODONE (OXY IR/ROXICODONE) 5 MG immediate release tablet Take 1 tablet (5 mg total) by mouth every 8 (eight) hours as needed for severe pain. 02/14/18  Yes Bayard Hugger, NP  predniSONE (DELTASONE) 5 MG tablet Take 5 mg by mouth daily with breakfast.   Yes [provider]  promethazine (PHENERGAN) 25 MG tablet Take 1 tablet (25 mg total) by mouth every 6 (six) hours as needed for nausea or vomiting (or sleep). 02/03/18  Yes Deno Etienne, DO  rosuvastatin (CRESTOR) 20 MG tablet Take 1 tablet (20 mg total) by mouth daily. 10/25/17  Yes [name], MD  sertraline (ZOLOFT) 100 MG tablet Take 100 mg by mouth every morning.   Yes [provider]  erythromycin base (E-MYCIN) 500 MG tablet Take 500 mg by mouth daily.    [provider]    Inpatient Medications: Scheduled Meds: . azaTHIOprine  125 mg Oral QPM  . calcitRIOL  0.25 mcg Oral Q3 days  . carbamazepine  200 mg Oral BID  . clopidogrel  75 mg Oral Daily  . cyclobenzaprine  10 mg Oral BID  . diltiazem  120 mg Oral Daily  . DULoxetine  60  mg Oral q morning - 10a  . famotidine  20 mg Oral Daily  . insulin aspart  0-9 Units Subcutaneous Q4H  . insulin glargine  8 Units Subcutaneous QHS  . LORazepam  1 mg Oral q morning - 10a  . metoprolol tartrate  25 mg Oral BID  . mometasone-formoterol  2 puff Inhalation BID  . montelukast  10 mg Oral QHS  . pantoprazole  40 mg Oral Daily  . predniSONE  5 mg Oral Q breakfast  . rosuvastatin  20 mg Oral q1800  . sertraline  100 mg Oral q morning - 10a   Continuous Infusions: . heparin 950 Units/hr (04/20/18 0850)   PRN Meds: acetaminophen, albuterol,  guaiFENesin, nitroGLYCERIN, ondansetron (ZOFRAN) IV, oxyCODONE, promethazine  Allergies:    Allergies  Allergen Reactions  . Tetracycline Hives  . Hydromorphone Hcl Nausea And Vomiting  . Niacin Other (See Comments)    Mouth blisters  . Niaspan [Niacin Er] Other (See Comments)    Mouth blisters  . Sulfa Antibiotics Nausea Only and Other (See Comments)    "Tears up stomach"  . Sulfonamide Derivatives Other (See Comments)    Reaction: per patient "tears her stomach up"  . Codeine Nausea And Vomiting  . Erythromycin Nausea And Vomiting  . Hydromorphone Hcl Nausea And Vomiting  . Morphine And Related Nausea And Vomiting  . Nalbuphine Nausea And Vomiting  . Sulfasalazine Nausea Only and Other (See Comments)    per patient "tears her stomach up", "Tears up stomach"  . Tape Rash and Other (See Comments)    No "plastic" tape," please    Social History:   Social History   Socioeconomic History  . Marital status: Married    Spouse name: Not on file  . Number of children: Not on file  . Years of education: Not on file  . Highest education level: Not on file  Occupational History  . Not on file  Social Needs  . Financial resource strain: Not on file  . Food insecurity:    Worry: Not on file    Inability: Not on file  . Transportation needs:    Medical: Not on file    Non-medical: Not on file  Tobacco Use  . Smoking status: Former Smoker    Packs/day: 1.00    Years: 30.00    Pack years: 30.00    Types: Cigarettes    Last attempt to quit: 08/17/2002    Years since quitting: 15.6  . Smokeless tobacco: Never Used  Substance and Sexual Activity  . Alcohol use: No  . Drug use: No  . Sexual activity: Not on file  Lifestyle  . Physical activity:    Days per week: Not on file    Minutes per session: Not on file  . Stress: Not on file  Relationships  . Social connections:    Talks on phone: Not on file    Gets together: Not on file    Attends religious service: Not on file      Active member of club or organization: Not on file    Attends meetings of clubs or organizations: Not on file    Relationship status: Not on file  . Intimate partner violence:    Fear of current or ex partner: Not on file    Emotionally abused: Not on file    Physically abused: Not on file    Forced sexual activity: Not on file  Other Topics Concern  . Not on file  Social History Narrative   She is currently married, and the caregiver of her husband who is recovering from surgery for tongue cancer now diagnosed with lung cancer. Prior to his diagnosis of her husband, she actually had adopted a 21-year-old child who she knows caring for as well. With all the surgeries, they have been quite financially troubled. Thanks the help of her community and church, they have been able to stay "alfoat."     She is a former smoker who quit in 2004 after a 30-pack-year history.   She is active chasing a 49-year-old child, does not do routine exercise. She's been quite depressed with the condition of her husband, and admits to eating comfort herself.   She does not drink alcohol.    Family History:    Family History  Problem Relation Age of Onset  . Cancer Mother        liver  . Heart disease Father   . Cancer Father        colon  . Arrhythmia Brother        Atrial Fibrillation  . Arrhythmia Paternal Aunt        Atrial Fibrillation     ROS:  Please see the history of present illness.  All other ROS reviewed and negative.     Physical Exam/Data:   Vitals:   04/20/18 0420 04/20/18 0805 04/20/18 0858 04/20/18 1136  BP: 128/69 (!) 110/55 136/73 140/70  Pulse: 65 73  (!) 55  Resp: 17     Temp: 98.5 F (36.9 C) 98.2 F (36.8 C)  97.8 F (36.6 C)  TempSrc: Oral Oral  Oral  SpO2: 98%     Weight: 104.7 kg     Height:        Intake/Output Summary (Last 24 hours) at 04/20/2018 1426 Last data filed at 04/20/2018 0850 Gross per 24 hour  Intake 148.26 ml  Output -  Net 148.26 ml   Filed  Weights   04/19/18 1748 04/20/18 0420  Weight: 105.2 kg 104.7 kg   Body mass index is 43.61 kg/m.  General:  Well nourished, well developed, in no acute distress HEENT: normal Lymph: no adenopathy Neck: no JVD Endocrine:  No thryomegaly Vascular: No carotid bruits  Cardiac:  RRR; no murmurs, gallops or rubs Lungs:  CTA b/l, no wheezing, rhonchi or rales  Abd: soft, nontender  Ext: no edema Musculoskeletal:  R hand is wrapped, appears to have a type of mesh dressing to her thumb, wound vac in place Skin: warm and dry  Neuro: no gross focal abnormalities noted Psych:  Normal affect   EKG:  The EKG was personally reviewed and demonstrates:    Looks  AFlutter126bpm, LBBB #2 AFib 115bpm, LBBB (a couple narrow complexes #3 AFib (vs atypical Aflutter) 91bpm, QRS 101ms #4 SB (limb leads reversed) 59bpm, PR 168ms, QRS 15ms, QTc 433, diffuse T changes precordial leads Telemetry:  Telemetry was personally reviewed and demonstrates:   AFib w/RVR >> AFlutter >> SR/SB 50's-60's  Relevant CV Studies:  10/27/17: TTE Study Conclusions - Left ventricle: The cavity size was normal. Wall thickness was   increased in a pattern of moderate LVH. Systolic function was   normal. The estimated ejection fraction was in the range of 55%   to 60%. Wall motion was normal; there were no regional wall   motion abnormalities. Features are consistent with a pseudonormal   left ventricular filling pattern, with concomitant abnormal   relaxation and increased filling pressure (grade 2  diastolic   dysfunction). - Aortic valve: Mildly calcified annulus. Moderately thickened,   moderately calcified leaflets. There was mild stenosis. Valve   area (VTI): 1.93 cm^2. Valve area (Vmax): 1.9 cm^2. Valve area   (Vmean): 1.79 cm^2.  03/19/16: Lexiscan stress myoview  Nuclear stress EF: 62%.  There was no ST segment deviation noted during stress.  Findings consistent with prior myocardial infarction.  This is a  low risk study.  The left ventricular ejection fraction is normal (55-65%). Small anteroapical infarct with no ischemia EF 62% mild apical hypokinesis  Laboratory Data:  Chemistry Recent Labs  Lab 04/19/18 1844 04/20/18 0352  NA 139 139  K 4.0 4.6  CL 106 107  CO2 22 23  GLUCOSE 218* 185*  BUN 16 14  CREATININE 0.92 0.87  CALCIUM 9.0 8.9  GFRNONAA >60 >60  GFRAA >60 >60  ANIONGAP 11 9    No results for input(s): PROT, ALBUMIN, AST, ALT, ALKPHOS, BILITOT in the last 168 hours. Hematology Recent Labs  Lab 04/19/18 1844  WBC 5.5  RBC 3.61*  HGB 11.2*  HCT 35.7*  MCV 98.9  MCH 31.0  MCHC 31.4  RDW 14.6  PLT 223   Cardiac Enzymes Recent Labs  Lab 04/19/18 2121 04/20/18 0352 04/20/18 0740  TROPONINI 0.03* <0.03 0.03*    Recent Labs  Lab 04/19/18 1846  TROPIPOC 0.02    BNPNo results for input(s): BNP, PROBNP in the last 168 hours.  DDimer No results for input(s): DDIMER in the last 168 hours.  Radiology/Studies:   Dg Chest 2 View Result Date: 04/19/2018 CLINICAL DATA:  Chest pain. EXAM: CHEST - 2 VIEW COMPARISON:  Chest x-ray dated February 03, 2018. FINDINGS: The patient is rotated to the left. Stable cardiomegaly status post CABG. Normal pulmonary vascularity. No focal consolidation, pleural effusion, or pneumothorax. No acute osseous abnormality. Multiple broken median sternotomy wires are unchanged. IMPRESSION: Stable cardiomegaly.  No active cardiopulmonary disease. Electronically Signed   By: Titus Dubin M.D.   On: 04/19/2018 18:31    Assessment and Plan:   1. Paroxysmal AFib, flutter is noted on telemetry as well     CHA2DS2Vasc is 6, out patient not on a/c  Historically had GIB on Eliquis, though post tumor removal as far as the patient recalls, never tried again. On heparin gtt now, pending cath tomorrow  Complicated PMHx, CAD, she has been struggling for 3 months with non-healing wound R thumb, this is her second go with wound vac and has  required antibiotics with this, just finished course again of erythromycin, she has a transplanted kidney (that seems to be working well)  Tikosyn can have issues with her following meds Erythromycin phenergan Sertraline Amiodarone (the same)   I think 1st, wait for cath findings, if new ischemic disease, perhaps this is the trigger for up-tick in her palpitations in the last few months.      For questions or updates, please contact [place] Please consult [email or web address] for contact info under Cardiology/STEMI.   Signed, Baldwin Jamaica, PA-C  04/20/2018 2:26 PM  I have seen, examined the patient, and reviewed the above assessment and plan.  Changes to above are made where necessary.  On exam, iRRR.  Pt with both afib and typical appearing atrial flutter.  I would advise AAD therapy over ablation currently.  Given bradycardia and CAD, our options are limited.  I would favor initiation of tikosyn while here with close follow-up on telemetry.  chads2vasc score is at least 5.  Would start [place] with eliquis post cath if possible.  Will make this decision with Dr Stanford [name] pending results of cath.  EP to follow along with general cardiology  Co Sign: Thompson Grayer, MD 04/21/2018 11:04 AM

## 2018-04-20 NOTE — Progress Notes (Addendum)
[place] for heparinpost-cath Indication: atrial fibrillation, ACS  Allergies  Allergen Reactions  . Tetracycline Hives  . Hydromorphone Hcl Nausea And Vomiting  . Niacin Other (See Comments)    Mouth blisters  . Niaspan [Niacin Er] Other (See Comments)    Mouth blisters  . Sulfa Antibiotics Nausea Only and Other (See Comments)    "Tears up stomach"  . Sulfonamide Derivatives Other (See Comments)    Reaction: per patient "tears her stomach up"  . Codeine Nausea And Vomiting  . Erythromycin Nausea And Vomiting  . Hydromorphone Hcl Nausea And Vomiting  . Morphine And Related Nausea And Vomiting  . Nalbuphine Nausea And Vomiting  . Sulfasalazine Nausea Only and Other (See Comments)    per patient "tears her stomach up", "Tears up stomach"  . Tape Rash and Other (See Comments)    No "plastic" tape," please    Patient Measurements: Height: 5\' 1"  (154.9 cm) Weight: 230 lb 12.8 oz (104.7 kg) IBW/kg (Calculated) : 47.8 Heparin Dosing Weight: 73.4 kg  Vital Signs: Temp: 98.2 F (36.8 C) (09/04 0805) Temp Source: Oral (09/04 0805) BP: 110/55 (09/04 0805) Pulse Rate: 73 (09/04 0805)  Labs: Recent Labs    04/19/18 1844 04/19/18 2121 04/20/18 0352 04/20/18 0740  HGB 11.2*  --   --   --   HCT 35.7*  --   --   --   PLT 223  --   --   --   HEPARINUNFRC  --   --   --  0.77*  CREATININE 0.92  --  0.87  --   TROPONINI  --  0.03* <0.03  --     Estimated Creatinine Clearance: 76.6 mL/min (by C-G formula based on SCr of 0.87 mg/dL).   Medical History: Past Medical History:  Diagnosis Date  . Anemia   . Anxiety   . Bilateral carotid artery stenosis    Carotid duplex 03/5276: 8-24% LICA, 23-53% RICA, >61% RECA, f/u 1 yr suggested  . CAD (coronary artery disease) of bypass graft 5/01; 3/'02, 8/'03, 10/'04; 1/15   PCI x 5 to SVG-D1   . CAD in native artery 07/1993    3 Vessel Disease (LAD-D1 & RCA) -- CABG  . CAD S/P percutaneous  coronary angioplasty    PCI to SVG-D1 insertion/native D1 x 4 = '01 -(S660 BMS 2.5 x 9 - insertion into D1; '02 - distal overlap ACS Pixel 2.5 x 8  BMS; '03 distal/native ISR/Thrombosis - Pixel 2.5 x 13; '04 - ISR-  Taxus 2.5 x 20 (covered all);; 1/15 - mid SVG-D1 (50% distal ISR) - Promus P 2.75 x 20 -- 2.8 mm  . COPD mixed type ([place])    Followed by Dr. Lamonte Sakai "pulmonologist said no COPD"  . Depression   . Depression with anxiety   . Diabetes mellitus type 2 in obese ([place])   . Diarrhea    started after cholecystectomy and mass removed from intestine  . Dyslipidemia, goal LDL below 70    08/2012: TC 137, TG 200, HDL 32!, LDL 45; on statin (followed by Dr.Deterding)  . ESRD (end stage renal disease) ([place]) 1991   s/p Cadaveric Renal Transplant [facility] - Dr. Jimmy Footman)   . Family history of adverse reaction to anesthesia    mom's bp dropped during/after anesthesia  . Fibromyalgia   . GERD (gastroesophageal reflux disease)   . Glomerulonephritis, chronic, rapidly progressive 45  . 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 07/1993  Rescue PTCA of RCA -- referred for CABG.  . H/O: GI bleed   . Headache    migraines in the past  . History of kidney stones   . Hypertension associated with diabetes ([place])   . MRSA (methicillin resistant staph aureus) culture positive   . Obesity    s/p Lap Band 04/2004- Port Replacement 10/09 & 2/10 for infection; Dr. Excell Seltzer.  . OSA (obstructive sleep apnea)    no longer on CPAP or home O2, states she doesn't need now after lap band  . PAD (peripheral artery disease) ([place]) 08/2013   LEA Dopplers to be read by Dr. Fletcher Anon  . PAF (paroxysmal atrial fibrillation) ([place]) 06/2014   Noted on CardioNet Monitor  - rate ~112; no recurrent symptoms. Nonionic regulation because of frequent GI procedures. Patient prefers Plavix  . Pneumonia   . Recurrent boils    Bilateral Groin  . Rheumatoid arthritis ([place])    Per Patient Report; associated  with OA  . 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 Servando Snare: LIMA-LAD, SVG-bifurcatingD1, SVG-rPDA  . S/p cadaver renal transplant [place]  . Stroke [facility]) 2012   "right eye stroke- half blind now"  . Unstable angina ([place]) 5/01; 3/'02, 8/'03, 10/'04; 1/15   x 5 occurences since Inf-Post STEMI in 1994    Medications:  Medications Prior to Admission  Medication Sig Dispense Refill Last Dose  . acetaminophen (TYLENOL) 500 MG tablet Take 1 tablet (500 mg total) by mouth every 6 (six) hours as needed for moderate pain or headache. (Patient taking differently: Take 1,000 mg by mouth every 6 (six) hours as needed for moderate pain or headache. ) 30 tablet 0 unk  . albuterol (VENTOLIN HFA) 108 (90 Base) MCG/ACT inhaler INHALE 2 PUFFS INTO THE LUNGS EVERY 6 (SIX) HOURS AS NEEDED FOR WHEEZING OR SHORTNESS OF BREATH. (Patient taking differently: Inhale 2 puffs into the lungs every 6 (six) hours as needed for wheezing or shortness of breath. ) 18 Inhaler 2 unk  . azaTHIOprine (IMURAN) 50 MG tablet Take 125 mg by mouth every evening.    04/19/2018 at Unknown time  . budesonide-formoterol (SYMBICORT) 160-4.5 MCG/ACT inhaler Inhale 2 puffs into the lungs 2 (two) times daily. 2 Inhaler 0 04/19/2018 at Unknown time  . calcitRIOL (ROCALTROL) 0.25 MCG capsule Take 0.25 mcg by mouth every 3 (three) days.    04/18/2018 at Unknown time  . carbamazepine (TEGRETOL) 200 MG tablet TAKE 200 MG BY MOUTH TWICE A DAY (Patient taking differently: Take 200 mg by mouth 2 (two) times daily. ) 60 tablet 2 04/19/2018 at Unknown time  . clopidogrel (PLAVIX) 75 MG tablet TAKE 1 TABLET BY MOUTH EVERY DAY (Patient taking differently: Take 75 mg by mouth daily. ) 90 tablet 0 04/19/2018 at 0800  . clotrimazole (MYCELEX) 10 MG troche Take 1 tablet (10 mg total) by mouth 5 (five) times daily. (Patient taking differently: Take 10 mg by mouth daily as needed (thrush). ) 70 tablet 0 unk  . cyclobenzaprine (FLEXERIL) 10 MG tablet TAKE 1 TABLET BY MOUTH 3 TIMES  DAILY AS NEEDED FOR MUSCLE SPASMS (Patient taking differently: Take 10 mg by mouth 2 (two) times daily. ) 90 tablet 2 unk  . diltiazem (CARDIZEM CD) 120 MG 24 hr capsule TAKE 1 CAPSULE (120 MG TOTAL) BY MOUTH DAILY. NEED OV. (Patient taking differently: Take 120 mg by mouth daily. ) 30 capsule 0 04/19/2018 at Unknown time  . DULoxetine (CYMBALTA) 30 MG capsule TAKE 2  CAPSULES (60 MG TOTAL) BY MOUTH EVERY MORNING. 180 capsule 2 04/19/2018 at Unknown time  . erythromycin base (E-MYCIN) 500 MG tablet Take 500 mg by mouth daily.   04/19/2018 at Unknown time  . famotidine (PEPCID) 20 MG tablet Take 20 mg by mouth daily.   04/19/2018 at Unknown time  . ferrous sulfate 325 (65 FE) MG tablet Take 1 tablet (325 mg total) by mouth 3 (three) times daily with meals. (Patient taking differently: Take 325 mg by mouth 2 (two) times daily. ) 90 tablet 0 04/19/2018 at Unknown time  . fluticasone (FLONASE) 50 MCG/ACT nasal spray Place 2 sprays into both nostrils 2 (two) times daily. 48 g 1 04/19/2018 at Unknown time  . glimepiride (AMARYL) 4 MG tablet Take 4 mg by mouth every morning.  6 04/19/2018 at Unknown time  . guaiFENesin (MUCINEX) 600 MG 12 hr tablet Take 1 tablet (600 mg total) by mouth 2 (two) times daily. (Patient taking differently: Take 600 mg by mouth 2 (two) times daily as needed for cough or to loosen phlegm. ) 30 tablet 0 unk  . Insulin Glargine (BASAGLAR KWIKPEN) 100 UNIT/ML SOPN Inject 12 Units into the skin daily.   04/19/2018 at Unknown time  . loperamide (IMODIUM A-D) 2 MG tablet Take 4 mg by mouth 2 (two) times daily.   04/19/2018 at Unknown time  . LORazepam (ATIVAN) 1 MG tablet Take 1 mg by mouth every morning.    04/19/2018 at Unknown time  . metFORMIN (GLUCOPHAGE) 500 MG tablet Take 500 mg by mouth 2 (two) times daily.  6 04/19/2018 at Unknown time  . metoprolol tartrate (LOPRESSOR) 25 MG tablet Take 1 tablet (25 mg total) by mouth 2 (two) times daily. 180 tablet 1 04/19/2018 at 0800  . montelukast (SINGULAIR) 10 MG  tablet TAKE 1 TABLET BY MOUTH EVERY DAY 90 tablet 1 04/19/2018 at Unknown time  . nitroGLYCERIN (NITROSTAT) 0.4 MG SL tablet Place 1 tablet (0.4 mg total) under the tongue every 5 (five) minutes as needed for chest pain. 25 tablet 6 unk  . NOVOLOG FLEXPEN 100 UNIT/ML FlexPen Inject 4 Units into the skin 2 (two) times daily as needed for high blood sugar.   6 04/18/2018 at Unknown time  . omeprazole (PRILOSEC) 40 MG capsule Take 40 mg by mouth every morning.    04/19/2018 at Unknown time  . oxyCODONE (OXY IR/ROXICODONE) 5 MG immediate release tablet Take 1 tablet (5 mg total) by mouth every 8 (eight) hours as needed for severe pain. 60 tablet 0 04/19/2018 at Unknown time  . predniSONE (DELTASONE) 5 MG tablet Take 5 mg by mouth daily with breakfast.   04/19/2018 at Unknown time  . promethazine (PHENERGAN) 25 MG tablet Take 1 tablet (25 mg total) by mouth every 6 (six) hours as needed for nausea or vomiting (or sleep). 15 tablet 0 04/18/2018 at Unknown time  . rosuvastatin (CRESTOR) 20 MG tablet Take 1 tablet (20 mg total) by mouth daily. 90 tablet 1 04/19/2018 at Unknown time  . sertraline (ZOLOFT) 100 MG tablet Take 100 mg by mouth every morning.   04/19/2018 at Unknown time    Assessment: 30 yof presenting with CP continuing on heparin for afib/ACS. Hx of afib not on anticoagulation PTA previously due to frequent GI procedures. Heparin level slightly supratherapeutic at 0.77. Hg 11.2, plt wnl. No bleed or issues with infusion per discussion with RN.  Goal of Therapy:  Heparin level 0.3-0.7 units/ml Monitor platelets by anticoagulation protocol: Yes  Plan:  Reduce heparin to 950 units/hr 6h heparin level Monitor daily heparin level and CBC, s/sx bleeding  Elicia Lamp, PharmD, BCPS Clinical Pharmacist Clinical phone [phone] Please check AMION for all [place] contact numbers 04/20/2018 8:36 AM   ADDENDUM:  Pharmacy consulted to transition heparin to apixaban post-cath for 9/5 PM dose. Noted DDI with  carbamazepine. PA and MD to discuss anticoagulation options post-cath. D/c apixaban for now per discussion.  Elicia Lamp, PharmD, BCPS Clinical Pharmacist Clinical phone [phone] Please check AMION for all [place] contact numbers 04/20/2018 11:27 AM

## 2018-04-20 NOTE — Progress Notes (Signed)
ANTICOAGULATION CONSULT NOTE - Follow Up Consult  Pharmacy Consult for Heparin Indication: Afib and ACS  Allergies  Allergen Reactions  . Tetracycline Hives  . Hydromorphone Hcl Nausea And Vomiting  . Niacin Other (See Comments)    Mouth blisters  . Niaspan [Niacin Er] Other (See Comments)    Mouth blisters  . Sulfa Antibiotics Nausea Only and Other (See Comments)    "Tears up stomach"  . Sulfonamide Derivatives Other (See Comments)    Reaction: per patient "tears her stomach up"  . Codeine Nausea And Vomiting  . Erythromycin Nausea And Vomiting  . Hydromorphone Hcl Nausea And Vomiting  . Morphine And Related Nausea And Vomiting  . Nalbuphine Nausea And Vomiting  . Sulfasalazine Nausea Only and Other (See Comments)    per patient "tears her stomach up", "Tears up stomach"  . Tape Rash and Other (See Comments)    No "plastic" tape," please    Patient Measurements: Height: 5\' 1"  (154.9 cm) Weight: 230 lb 12.8 oz (104.7 kg) IBW/kg (Calculated) : 47.8 Heparin Dosing Weight:  73.4 kg  Vital Signs: Temp: 97.8 F (36.6 C) (09/04 1136) Temp Source: Oral (09/04 1136) BP: 140/70 (09/04 1136) Pulse Rate: 55 (09/04 1136)  Labs: Recent Labs    04/19/18 1844 04/19/18 2121 04/20/18 0352 04/20/18 0740 04/20/18 1536  HGB 11.2*  --   --   --   --   HCT 35.7*  --   --   --   --   PLT 223  --   --   --   --   HEPARINUNFRC  --   --   --  0.77* 0.55  CREATININE 0.92  --  0.87  --   --   TROPONINI  --  0.03* <0.03 0.03*  --     Estimated Creatinine Clearance: 76.6 mL/min (by C-G formula based on SCr of 0.87 mg/dL).   Assessment: Anticoagulation: h/o PAF and CVA (no anticoag PTA but h/o Eliquis stopped for GIB). Plan cath for ACS symptoms. Heparin level 0.77 now down to 0.55 in goal range. --Carbamazepine/Eliquis combo: Plasma concentrations and pharmacologic effects of Apixaban may be decreased by Strong CYP3A4 and P-gP Inducers. Coadministration of Apixaban and Strong CYP3A4  and P-gP Inducers should be avoided    Goal of Therapy:  Heparin level 0.3-0.7 units/ml Monitor platelets by anticoagulation protocol: Yes   Plan:  Cath tomorrow Continue IV heparin at 950 units/hr Heparin level and CBC in AM and daily.   Shannie Kontos S. Alford Highland, PharmD, BCPS Clinical Staff Pharmacist  Eilene Ghazi Stillinger 04/20/2018,4:47 PM

## 2018-04-21 ENCOUNTER — Other Ambulatory Visit: Payer: Self-pay | Admitting: Cardiology

## 2018-04-21 ENCOUNTER — Encounter (HOSPITAL_COMMUNITY)
Admission: EM | Disposition: A | Discharge status: Home / Self Care | Payer: Self-pay | Source: Home / Self Care | Attending: Internal Medicine

## 2018-04-21 DIAGNOSIS — I25119 Atherosclerotic heart disease of native coronary artery with unspecified angina pectoris: Secondary | ICD-10-CM | POA: Diagnosis present

## 2018-04-21 DIAGNOSIS — E1159 Type 2 diabetes mellitus with other circulatory complications: Secondary | ICD-10-CM | POA: Diagnosis not present

## 2018-04-21 DIAGNOSIS — I25118 Atherosclerotic heart disease of native coronary artery with other forms of angina pectoris: Secondary | ICD-10-CM | POA: Diagnosis not present

## 2018-04-21 DIAGNOSIS — Z6841 Body Mass Index (BMI) 40.0 and over, adult: Secondary | ICD-10-CM | POA: Diagnosis not present

## 2018-04-21 DIAGNOSIS — E1142 Type 2 diabetes mellitus with diabetic polyneuropathy: Secondary | ICD-10-CM | POA: Diagnosis present

## 2018-04-21 DIAGNOSIS — K219 Gastro-esophageal reflux disease without esophagitis: Secondary | ICD-10-CM | POA: Diagnosis present

## 2018-04-21 DIAGNOSIS — I70201 Unspecified atherosclerosis of native arteries of extremities, right leg: Secondary | ICD-10-CM | POA: Diagnosis not present

## 2018-04-21 DIAGNOSIS — I483 Typical atrial flutter: Secondary | ICD-10-CM

## 2018-04-21 DIAGNOSIS — E1151 Type 2 diabetes mellitus with diabetic peripheral angiopathy without gangrene: Secondary | ICD-10-CM | POA: Diagnosis present

## 2018-04-21 DIAGNOSIS — R079 Chest pain, unspecified: Secondary | ICD-10-CM | POA: Diagnosis present

## 2018-04-21 DIAGNOSIS — Z940 Kidney transplant status: Secondary | ICD-10-CM | POA: Diagnosis not present

## 2018-04-21 DIAGNOSIS — E785 Hyperlipidemia, unspecified: Secondary | ICD-10-CM | POA: Diagnosis present

## 2018-04-21 DIAGNOSIS — I10 Essential (primary) hypertension: Secondary | ICD-10-CM | POA: Diagnosis not present

## 2018-04-21 DIAGNOSIS — I2510 Atherosclerotic heart disease of native coronary artery without angina pectoris: Secondary | ICD-10-CM | POA: Diagnosis not present

## 2018-04-21 DIAGNOSIS — J449 Chronic obstructive pulmonary disease, unspecified: Secondary | ICD-10-CM | POA: Diagnosis present

## 2018-04-21 DIAGNOSIS — F419 Anxiety disorder, unspecified: Secondary | ICD-10-CM | POA: Diagnosis present

## 2018-04-21 DIAGNOSIS — M069 Rheumatoid arthritis, unspecified: Secondary | ICD-10-CM | POA: Diagnosis present

## 2018-04-21 DIAGNOSIS — Z955 Presence of coronary angioplasty implant and graft: Secondary | ICD-10-CM | POA: Diagnosis not present

## 2018-04-21 DIAGNOSIS — Z8614 Personal history of Methicillin resistant Staphylococcus aureus infection: Secondary | ICD-10-CM | POA: Diagnosis not present

## 2018-04-21 DIAGNOSIS — Z23 Encounter for immunization: Secondary | ICD-10-CM | POA: Diagnosis not present

## 2018-04-21 DIAGNOSIS — I480 Paroxysmal atrial fibrillation: Secondary | ICD-10-CM | POA: Diagnosis present

## 2018-04-21 DIAGNOSIS — Z9884 Bariatric surgery status: Secondary | ICD-10-CM | POA: Diagnosis not present

## 2018-04-21 DIAGNOSIS — I4892 Unspecified atrial flutter: Secondary | ICD-10-CM | POA: Diagnosis not present

## 2018-04-21 DIAGNOSIS — M797 Fibromyalgia: Secondary | ICD-10-CM | POA: Diagnosis present

## 2018-04-21 DIAGNOSIS — Z951 Presence of aortocoronary bypass graft: Secondary | ICD-10-CM | POA: Diagnosis not present

## 2018-04-21 DIAGNOSIS — F418 Other specified anxiety disorders: Secondary | ICD-10-CM | POA: Diagnosis present

## 2018-04-21 DIAGNOSIS — G4733 Obstructive sleep apnea (adult) (pediatric): Secondary | ICD-10-CM | POA: Diagnosis present

## 2018-04-21 DIAGNOSIS — Z8673 Personal history of transient ischemic attack (TIA), and cerebral infarction without residual deficits: Secondary | ICD-10-CM | POA: Diagnosis not present

## 2018-04-21 DIAGNOSIS — I209 Angina pectoris, unspecified: Secondary | ICD-10-CM | POA: Diagnosis not present

## 2018-04-21 DIAGNOSIS — J309 Allergic rhinitis, unspecified: Secondary | ICD-10-CM | POA: Diagnosis present

## 2018-04-21 DIAGNOSIS — I152 Hypertension secondary to endocrine disorders: Secondary | ICD-10-CM | POA: Diagnosis present

## 2018-04-21 DIAGNOSIS — I252 Old myocardial infarction: Secondary | ICD-10-CM | POA: Diagnosis not present

## 2018-04-21 HISTORY — PX: LEFT HEART CATH AND CORS/GRAFTS ANGIOGRAPHY: CATH118250

## 2018-04-21 HISTORY — PX: ABDOMINAL AORTOGRAM: CATH118222

## 2018-04-21 LAB — CBC
HCT: 37.9 % (ref 36.0–46.0)
HEMOGLOBIN: 12 g/dL (ref 12.0–15.0)
MCH: 31 pg (ref 26.0–34.0)
MCHC: 31.7 g/dL (ref 30.0–36.0)
MCV: 97.9 fL (ref 78.0–100.0)
PLATELETS: 199 10*3/uL (ref 150–400)
RBC: 3.87 MIL/uL (ref 3.87–5.11)
RDW: 14.8 % (ref 11.5–15.5)
WBC: 5.1 10*3/uL (ref 4.0–10.5)

## 2018-04-21 LAB — BASIC METABOLIC PANEL
ANION GAP: 12 (ref 5–15)
BUN: 13 mg/dL (ref 6–20)
CHLORIDE: 108 mmol/L (ref 98–111)
CO2: 21 mmol/L — ABNORMAL LOW (ref 22–32)
Calcium: 9.1 mg/dL (ref 8.9–10.3)
Creatinine, Ser: 0.89 mg/dL (ref 0.44–1.00)
Glucose, Bld: 151 mg/dL — ABNORMAL HIGH (ref 70–99)
POTASSIUM: 3.9 mmol/L (ref 3.5–5.1)
SODIUM: 141 mmol/L (ref 135–145)

## 2018-04-21 LAB — GLUCOSE, CAPILLARY
GLUCOSE-CAPILLARY: 143 mg/dL — AB (ref 70–99)
GLUCOSE-CAPILLARY: 153 mg/dL — AB (ref 70–99)
Glucose-Capillary: 154 mg/dL — ABNORMAL HIGH (ref 70–99)
Glucose-Capillary: 140 mg/dL — ABNORMAL HIGH (ref 70–99)
Glucose-Capillary: 139 mg/dL — ABNORMAL HIGH (ref 70–99)
Glucose-Capillary: 108 mg/dL — ABNORMAL HIGH (ref 70–99)
Glucose-Capillary: 129 mg/dL — ABNORMAL HIGH (ref 70–99)

## 2018-04-21 LAB — HEPARIN LEVEL (UNFRACTIONATED): HEPARIN UNFRACTIONATED: 0.58 [IU]/mL (ref 0.30–0.70)

## 2018-04-21 LAB — MAGNESIUM: MAGNESIUM: 1.7 mg/dL (ref 1.7–2.4)

## 2018-04-21 LAB — POTASSIUM: POTASSIUM: 4.4 mmol/L (ref 3.5–5.1)

## 2018-04-21 SURGERY — LEFT HEART CATH AND CORS/GRAFTS ANGIOGRAPHY
Anesthesia: LOCAL

## 2018-04-21 MED ORDER — FENTANYL CITRATE (PF) 100 MCG/2ML IJ SOLN
25.0000 ug | INTRAMUSCULAR | Status: AC | PRN
  Administered 2018-04-23 – 2018-04-24 (×3): 25 ug via INTRAVENOUS
  Filled 2018-04-21 (×3): qty 2

## 2018-04-21 MED ORDER — LIDOCAINE HCL (PF) 1 % IJ SOLN
INTRAMUSCULAR | Status: AC
  Filled 2018-04-21: qty 30

## 2018-04-21 MED ORDER — DOFETILIDE 500 MCG PO CAPS
500.0000 ug | ORAL_CAPSULE | Freq: Two times a day (BID) | ORAL | Status: DC
  Administered 2018-04-21 – 2018-04-22 (×2): 500 ug via ORAL
  Filled 2018-04-21: qty 1

## 2018-04-21 MED ORDER — FENTANYL CITRATE (PF) 100 MCG/2ML IJ SOLN
INTRAMUSCULAR | Status: DC | PRN
  Administered 2018-04-21 (×5): 25 ug via INTRAVENOUS

## 2018-04-21 MED ORDER — MIDAZOLAM HCL 2 MG/2ML IJ SOLN
INTRAMUSCULAR | Status: AC
  Filled 2018-04-21: qty 2

## 2018-04-21 MED ORDER — POTASSIUM CHLORIDE CRYS ER 20 MEQ PO TBCR
30.0000 meq | EXTENDED_RELEASE_TABLET | Freq: Once | ORAL | Status: AC
  Administered 2018-04-21: 30 meq via ORAL
  Filled 2018-04-21: qty 1

## 2018-04-21 MED ORDER — LIDOCAINE HCL (PF) 1 % IJ SOLN
INTRAMUSCULAR | Status: DC | PRN
  Administered 2018-04-21: 3 mL via SUBCUTANEOUS
  Administered 2018-04-21: 2 mL via SUBCUTANEOUS
  Administered 2018-04-21: 20 mL via SUBCUTANEOUS

## 2018-04-21 MED ORDER — SODIUM CHLORIDE 0.9% FLUSH
3.0000 mL | Freq: Two times a day (BID) | INTRAVENOUS | Status: DC
  Administered 2018-04-22 – 2018-04-25 (×6): 3 mL via INTRAVENOUS

## 2018-04-21 MED ORDER — FENTANYL CITRATE (PF) 100 MCG/2ML IJ SOLN
INTRAMUSCULAR | Status: AC
  Filled 2018-04-21: qty 2

## 2018-04-21 MED ORDER — SODIUM CHLORIDE 0.9 % IV SOLN: 250.0000 mL | INTRAVENOUS | Status: DC | PRN

## 2018-04-21 MED ORDER — SODIUM CHLORIDE 0.9 % IV SOLN
INTRAVENOUS | Status: DC
  Administered 2018-04-21 – 2018-04-22 (×2): via INTRAVENOUS

## 2018-04-21 MED ORDER — IOHEXOL 350 MG/ML SOLN
INTRAVENOUS | Status: DC | PRN
  Administered 2018-04-21: 175 mL via INTRA_ARTERIAL

## 2018-04-21 MED ORDER — HEPARIN (PORCINE) IN NACL 1000-0.9 UT/500ML-% IV SOLN
INTRAVENOUS | Status: AC
  Filled 2018-04-21: qty 1000

## 2018-04-21 MED ORDER — VERAPAMIL HCL 2.5 MG/ML IV SOLN
INTRAVENOUS | Status: DC | PRN
  Administered 2018-04-21: 10 mL via INTRA_ARTERIAL

## 2018-04-21 MED ORDER — SODIUM CHLORIDE 0.9% FLUSH: 3.0000 mL | INTRAVENOUS | Status: DC | PRN

## 2018-04-21 MED ORDER — DOFETILIDE 500 MCG PO CAPS
500.0000 ug | ORAL_CAPSULE | Freq: Two times a day (BID) | ORAL | Status: DC
  Filled 2018-04-21: qty 1

## 2018-04-21 MED ORDER — VERAPAMIL HCL 2.5 MG/ML IV SOLN
INTRAVENOUS | Status: AC
  Filled 2018-04-21: qty 2

## 2018-04-21 MED ORDER — MIDAZOLAM HCL 2 MG/2ML IJ SOLN
INTRAMUSCULAR | Status: DC | PRN
  Administered 2018-04-21 (×5): 1 mg via INTRAVENOUS

## 2018-04-21 MED ORDER — HEPARIN SODIUM (PORCINE) 1000 UNIT/ML IJ SOLN
INTRAMUSCULAR | Status: AC
  Filled 2018-04-21: qty 1

## 2018-04-21 MED ORDER — HEPARIN SODIUM (PORCINE) 1000 UNIT/ML IJ SOLN
INTRAMUSCULAR | Status: DC | PRN
  Administered 2018-04-21: 5000 [IU] via INTRAVENOUS

## 2018-04-21 MED ORDER — MAGNESIUM SULFATE 2 GM/50ML IV SOLN
2.0000 g | Freq: Once | INTRAVENOUS | Status: AC
  Administered 2018-04-21: 22:00:00 2 g via INTRAVENOUS
  Filled 2018-04-21: qty 50

## 2018-04-21 MED ORDER — SODIUM CHLORIDE 0.9% FLUSH
3.0000 mL | Freq: Two times a day (BID) | INTRAVENOUS | Status: DC
  Administered 2018-04-21 – 2018-04-23 (×3): 3 mL via INTRAVENOUS

## 2018-04-21 MED ORDER — HEPARIN (PORCINE) IN NACL 1000-0.9 UT/500ML-% IV SOLN
INTRAVENOUS | Status: DC | PRN
  Administered 2018-04-21 (×2): 500 mL

## 2018-04-21 SURGICAL SUPPLY — 23 items
BRACE RADIAL COMPRESSION RADST (HEMOSTASIS) ×1 IMPLANT
CATH INFINITI 5FR AL1 (CATHETERS) ×1 IMPLANT
CATH INFINITI MULTIPACK ST 5F (CATHETERS) ×1 IMPLANT
CATH LAUNCHER 5F RADR (CATHETERS) IMPLANT
CATHETER LAUNCHER 5F RADR (CATHETERS) ×2
DEVICE RAD COMP TR BAND LRG (VASCULAR PRODUCTS) ×1 IMPLANT
GLIDESHEATH SLEND SS 6F .021 (SHEATH) ×1 IMPLANT
GUIDEWIRE .025 260CM (WIRE) ×1 IMPLANT
GUIDEWIRE INQWIRE 1.5J.035X260 (WIRE) IMPLANT
HOVERMATT SINGLE USE (MISCELLANEOUS) ×1 IMPLANT
INQWIRE 1.5J .035X260CM (WIRE) ×2
KIT HEART LEFT (KITS) ×2 IMPLANT
KIT MICROPUNCTURE NIT STIFF (SHEATH) ×1 IMPLANT
NDL SMART REG 18GX2-3/4 (NEEDLE) IMPLANT
NEEDLE SMART REG 18GX2-3/4 (NEEDLE) ×2 IMPLANT
PACK CARDIAC CATHETERIZATION (CUSTOM PROCEDURE TRAY) ×2 IMPLANT
SHEATH PINNACLE 5F 10CM (SHEATH) ×1 IMPLANT
SHEATH PROBE COVER 6X72 (BAG) ×1 IMPLANT
SYR MEDRAD MARK V 150ML (SYRINGE) ×1 IMPLANT
TRANSDUCER W/STOPCOCK (MISCELLANEOUS) ×2 IMPLANT
TUBING CIL FLEX 10 FLL-RA (TUBING) ×2 IMPLANT
WIRE HI TORQ VERSACORE-J 145CM (WIRE) ×1 IMPLANT
WIRE MICROINTRODUCER 60CM (WIRE) ×1 IMPLANT

## 2018-04-21 NOTE — Interval H&P Note (Signed)
History and Physical Interval Note:  04/21/2018 6:04 PM  Katherine [name]  has presented today for surgery, with the diagnosis of cp - (Concerning for Angina) - Known CAD w/p CABG & PCI  The various methods of treatment have been discussed with the patient and family. After consideration of risks, benefits and other options for treatment, the patient has consented to  Procedure(s): LEFT HEART CATH AND CORS/GRAFTS ANGIOGRAPHY (N/A) with possible PERCUTANEOUS CORONARY INTERVENTION as a surgical intervention .  The patient's history has been reviewed, patient examined, no change in status, stable for surgery.  I have reviewed the patient's chart and labs.  Questions were answered to the patient's satisfaction.    Cath Lab Visit (complete for each Cath Lab visit)  Clinical Evaluation Leading to the Procedure:   ACS: No.  Non-ACS:    Anginal Classification: CCS III  Anti-ischemic medical therapy: Maximal Therapy (2 or more classes of medications)  Non-Invasive Test Results: No non-invasive testing performed  Prior CABG: Previous CABG   [name]

## 2018-04-21 NOTE — Progress Notes (Addendum)
Progress Note  Patient Name: Katherine [name] Date of Encounter: 04/21/2018  Primary Cardiologist:  [name], MD  Subjective   No CP or SOB overnight, knows she went into a flutter overnight but does not feel it unless HR is high.   Inpatient Medications    Scheduled Meds: . azaTHIOprine  125 mg Oral QPM  . calcitRIOL  0.25 mcg Oral Q3 days  . carbamazepine  200 mg Oral BID  . clopidogrel  75 mg Oral Daily  . cyclobenzaprine  10 mg Oral BID  . diltiazem  120 mg Oral Daily  . DULoxetine  60 mg Oral q morning - 10a  . famotidine  20 mg Oral Daily  . insulin aspart  0-9 Units Subcutaneous Q4H  . insulin glargine  8 Units Subcutaneous QHS  . LORazepam  1 mg Oral q morning - 10a  . metoprolol tartrate  25 mg Oral BID  . mometasone-formoterol  2 puff Inhalation BID  . montelukast  10 mg Oral QHS  . pantoprazole  40 mg Oral Daily  . predniSONE  5 mg Oral Q breakfast  . rosuvastatin  20 mg Oral q1800  . sertraline  100 mg Oral q morning - 10a  . sodium chloride flush  3 mL Intravenous Q12H   Continuous Infusions: . sodium chloride    . sodium chloride 1.003 mL/kg/hr (04/21/18 0445)  . heparin 950 Units/hr (04/20/18 2044)   PRN Meds: sodium chloride, acetaminophen, albuterol, guaiFENesin, nitroGLYCERIN, ondansetron (ZOFRAN) IV, oxyCODONE, promethazine, sodium chloride flush   Vital Signs    Vitals:   04/21/18 0004 04/21/18 0358 04/21/18 0500 04/21/18 0800  BP: 109/70 124/62  125/71  Pulse: 65 60  63  Resp: 20     Temp: 98 F (36.7 C) 98.3 F (36.8 C)  98 F (36.7 C)  TempSrc: Oral Oral  Oral  SpO2: 93% 97%  99%  Weight:   104.4 kg   Height:        Intake/Output Summary (Last 24 hours) at 04/21/2018 1052 Last data filed at 04/20/2018 2044 Gross per 24 hour  Intake 720 ml  Output 350 ml  Net 370 ml   Filed Weights   04/19/18 1748 04/20/18 0420 04/21/18 0500  Weight: 105.2 kg 104.7 kg 104.4 kg    Telemetry    SR>>Atrial flutter, initially high rate,  now controlled - Personally Reviewed  ECG    09/04, 20:50 Atrial fib, HR 84, no acute ischemic changes - Personally Reviewed  Physical Exam   General: Well developed, obese, female appearing in no acute distress. Head: Normocephalic, atraumatic.  Neck: Supple without bruits, JVD not elevated. Lungs:  Resp regular and unlabored, decreased BS bases Heart: Irreg R&R, S1, S2, no S3, S4, soft murmur; no rub. Abdomen: Soft, non-tender, non-distended with normoactive bowel sounds. No hepatomegaly. No rebound/guarding. No obvious abdominal masses. Extremities: No clubbing, cyanosis, no edema. Distal pedal pulses are 2+ bilaterally. Neuro: Alert and oriented X 3. Moves all extremities spontaneously. Psych: Normal affect.  Labs    Hematology Recent Labs  Lab 04/19/18 1844 04/21/18 0307  WBC 5.5 5.1  RBC 3.61* 3.87  HGB 11.2* 12.0  HCT 35.7* 37.9  MCV 98.9 97.9  MCH 31.0 31.0  MCHC 31.4 31.7  RDW 14.6 14.8  PLT 223 199    Chemistry Recent Labs  Lab 04/19/18 1844 04/20/18 0352 04/21/18 0307  NA 139 139 141  K 4.0 4.6 3.9  CL 106 107 108  CO2 22 23 21*  GLUCOSE 218* 185* 151*  BUN 16 14 13   CREATININE 0.92 0.87 0.89  CALCIUM 9.0 8.9 9.1  GFRNONAA >60 >60 >60  GFRAA >60 >60 >60  ANIONGAP 11 9 12      Cardiac Enzymes Recent Labs  Lab 04/19/18 2121 04/20/18 0352 04/20/18 0740 04/20/18 1536  TROPONINI 0.03* <0.03 0.03* <0.03    Recent Labs  Lab 04/19/18 1846  TROPIPOC 0.02     Radiology    Dg Chest 2 View  Result Date: 04/19/2018 CLINICAL DATA:  Chest pain. EXAM: CHEST - 2 VIEW COMPARISON:  Chest x-ray dated February 03, 2018. FINDINGS: The patient is rotated to the left. Stable cardiomegaly status post CABG. Normal pulmonary vascularity. No focal consolidation, pleural effusion, or pneumothorax. No acute osseous abnormality. Multiple broken median sternotomy wires are unchanged. IMPRESSION: Stable cardiomegaly.  No active cardiopulmonary disease. Electronically  Signed   By: Titus Dubin M.D.   On: 04/19/2018 18:31     Cardiac Studies   CATH: Today at 3:15  Patient Profile     62 y.o. female w/ hx CABGx3 (LIMA-LAD, SVG-D1, SVG-RPDA.) followed by PCI x5 from [phone] as detailed in HPI below, PAD/Carotid disease, PAF, inf-post STEMI 1994, 2012 TIA/stroke, COPD, uncontrolled and insulin dependent DM2, HTN, HLD, morbid obesity s/p lap band surgery, OSA on CPAP and 2L night O2, and s/p 1991 renal transplant for ESRD dating back to 52.   Pt admitted 09/03 w/ CP and rapid atrial fib. S/p spont conversion to SR.   Assessment & Plan     1. Parox Afib/flutter - seen by EP, they will determine plan for antiarrhythmics - cannot [place] apixaban due to med interactions - will start coumadin after cath  2. CAD - Hx CABG 1995 and multiple PCIs since then - had angina w/ elevated HR - for cath today  3. Anticoag - discuss heparin w/ MD - for coumadin after cath\ - depending on the cath results, may need triple drug therapy - CHA2DS2-VASc = 6 (female, CVA x 2, CAD, HTN, CHF)  Otherwise, per IM Active Problems:   Sleep apnea   Extrinsic asthma   S/P CABG (coronary artery bypass graft) x 3   Hypertension associated with diabetes ([place])   Paroxysmal atrial fibrillation (HCC)   Type 2 diabetes mellitus with peripheral neuropathy (H[facility])   S/P kidney transplant   Chronic pain   Nonspecific chest pain    Jonetta Speak , PA-C 10:52 AM 04/21/2018 Pager: [phone] See progress notes. Kirk Ruths

## 2018-04-21 NOTE — H&P (View-Only) (Signed)
Progress Note  Patient Name: Katherine [name] Date of Encounter: 04/21/2018  Primary Cardiologist:  [name], MD  Subjective   No CP or SOB overnight, knows she went into a flutter overnight but does not feel it unless HR is high.   Inpatient Medications    Scheduled Meds: . azaTHIOprine  125 mg Oral QPM  . calcitRIOL  0.25 mcg Oral Q3 days  . carbamazepine  200 mg Oral BID  . clopidogrel  75 mg Oral Daily  . cyclobenzaprine  10 mg Oral BID  . diltiazem  120 mg Oral Daily  . DULoxetine  60 mg Oral q morning - 10a  . famotidine  20 mg Oral Daily  . insulin aspart  0-9 Units Subcutaneous Q4H  . insulin glargine  8 Units Subcutaneous QHS  . LORazepam  1 mg Oral q morning - 10a  . metoprolol tartrate  25 mg Oral BID  . mometasone-formoterol  2 puff Inhalation BID  . montelukast  10 mg Oral QHS  . pantoprazole  40 mg Oral Daily  . predniSONE  5 mg Oral Q breakfast  . rosuvastatin  20 mg Oral q1800  . sertraline  100 mg Oral q morning - 10a  . sodium chloride flush  3 mL Intravenous Q12H   Continuous Infusions: . sodium chloride    . sodium chloride 1.003 mL/kg/hr (04/21/18 0445)  . heparin 950 Units/hr (04/20/18 2044)   PRN Meds: sodium chloride, acetaminophen, albuterol, guaiFENesin, nitroGLYCERIN, ondansetron (ZOFRAN) IV, oxyCODONE, promethazine, sodium chloride flush   Vital Signs    Vitals:   04/21/18 0004 04/21/18 0358 04/21/18 0500 04/21/18 0800  BP: 109/70 124/62  125/71  Pulse: 65 60  63  Resp: 20     Temp: 98 F (36.7 C) 98.3 F (36.8 C)  98 F (36.7 C)  TempSrc: Oral Oral  Oral  SpO2: 93% 97%  99%  Weight:   104.4 kg   Height:        Intake/Output Summary (Last 24 hours) at 04/21/2018 1052 Last data filed at 04/20/2018 2044 Gross per 24 hour  Intake 720 ml  Output 350 ml  Net 370 ml   Filed Weights   04/19/18 1748 04/20/18 0420 04/21/18 0500  Weight: 105.2 kg 104.7 kg 104.4 kg    Telemetry    SR>>Atrial flutter, initially high rate,  now controlled - Personally Reviewed  ECG    09/04, 20:50 Atrial fib, HR 84, no acute ischemic changes - Personally Reviewed  Physical Exam   General: Well developed, obese, female appearing in no acute distress. Head: Normocephalic, atraumatic.  Neck: Supple without bruits, JVD not elevated. Lungs:  Resp regular and unlabored, decreased BS bases Heart: Irreg R&R, S1, S2, no S3, S4, soft murmur; no rub. Abdomen: Soft, non-tender, non-distended with normoactive bowel sounds. No hepatomegaly. No rebound/guarding. No obvious abdominal masses. Extremities: No clubbing, cyanosis, no edema. Distal pedal pulses are 2+ bilaterally. Neuro: Alert and oriented X 3. Moves all extremities spontaneously. Psych: Normal affect.  Labs    Hematology Recent Labs  Lab 04/19/18 1844 04/21/18 0307  WBC 5.5 5.1  RBC 3.61* 3.87  HGB 11.2* 12.0  HCT 35.7* 37.9  MCV 98.9 97.9  MCH 31.0 31.0  MCHC 31.4 31.7  RDW 14.6 14.8  PLT 223 199    Chemistry Recent Labs  Lab 04/19/18 1844 04/20/18 0352 04/21/18 0307  NA 139 139 141  K 4.0 4.6 3.9  CL 106 107 108  CO2 22 23 21*  GLUCOSE 218* 185* 151*  BUN 16 14 13   CREATININE 0.92 0.87 0.89  CALCIUM 9.0 8.9 9.1  GFRNONAA >60 >60 >60  GFRAA >60 >60 >60  ANIONGAP 11 9 12      Cardiac Enzymes Recent Labs  Lab 04/19/18 2121 04/20/18 0352 04/20/18 0740 04/20/18 1536  TROPONINI 0.03* <0.03 0.03* <0.03    Recent Labs  Lab 04/19/18 1846  TROPIPOC 0.02     Radiology    Dg Chest 2 View  Result Date: 04/19/2018 CLINICAL DATA:  Chest pain. EXAM: CHEST - 2 VIEW COMPARISON:  Chest x-ray dated February 03, 2018. FINDINGS: The patient is rotated to the left. Stable cardiomegaly status post CABG. Normal pulmonary vascularity. No focal consolidation, pleural effusion, or pneumothorax. No acute osseous abnormality. Multiple broken median sternotomy wires are unchanged. IMPRESSION: Stable cardiomegaly.  No active cardiopulmonary disease. Electronically  Signed   By: Titus Dubin M.D.   On: 04/19/2018 18:31     Cardiac Studies   CATH: Today at 3:15  Patient Profile     62 y.o. female w/ hx CABGx3 (LIMA-LAD, SVG-D1, SVG-RPDA.) followed by PCI x5 from [phone] as detailed in HPI below, PAD/Carotid disease, PAF, inf-post STEMI 1994, 2012 TIA/stroke, COPD, uncontrolled and insulin dependent DM2, HTN, HLD, morbid obesity s/p lap band surgery, OSA on CPAP and 2L night O2, and s/p 1991 renal transplant for ESRD dating back to 64.   Pt admitted 09/03 w/ CP and rapid atrial fib. S/p spont conversion to SR.   Assessment & Plan     1. Parox Afib/flutter - seen by EP, they will determine plan for antiarrhythmics - cannot [place] apixaban due to med interactions - will start coumadin after cath  2. CAD - Hx CABG 1995 and multiple PCIs since then - had angina w/ elevated HR - for cath today  3. Anticoag - discuss heparin w/ MD - for coumadin after cath\ - depending on the cath results, may need triple drug therapy - CHA2DS2-VASc = 6 (female, CVA x 2, CAD, HTN, CHF)  Otherwise, per IM Active Problems:   Sleep apnea   Extrinsic asthma   S/P CABG (coronary artery bypass graft) x 3   Hypertension associated with diabetes ([place])   Paroxysmal atrial fibrillation (HCC)   Type 2 diabetes mellitus with peripheral neuropathy (HCC)   S/P kidney transplant   Chronic pain   Nonspecific chest pain    Jonetta Speak , PA-C 10:52 AM 04/21/2018 Pager: [phone] See progress notes. Kirk Ruths

## 2018-04-21 NOTE — Progress Notes (Addendum)
Pharmacy Review for potential drug interactions with possible Dofetilide (Tikosyn) Initiation   62 y.o. female admitted 04/19/2018 with atrial fibrillation to be initiated on dofetilide this evening.   Assessment:  I have reviewed the patient's PTA medication list, history and I interviewed patient for potential drug interaction with Tikosyn. No drug interaction found except she reports she takes benadryl tablet every night. I have added Benadryl to her PTA med list. She reports she last took benadryl on Monday 9/2 evening.  She takes benadryl every night along with her carbamazepine to prevent itching.  Reports she will have itching from tegretol if she does not take benadryl.   PRN ondansetron has moderate additive risk of QT prolongation.   Nicole Cella, RPh Clinical Pharmacist Pager: [phone] 04/21/2018 3:21 PM

## 2018-04-21 NOTE — Progress Notes (Signed)
ANTICOAGULATION CONSULT NOTE - Follow Up Consult  Pharmacy Consult for Heparin Indication: Afib and ACS  Allergies  Allergen Reactions  . Tetracycline Hives  . Hydromorphone Hcl Nausea And Vomiting  . Niacin Other (See Comments)    Mouth blisters  . Niaspan [Niacin Er] Other (See Comments)    Mouth blisters  . Sulfa Antibiotics Nausea Only and Other (See Comments)    "Tears up stomach"  . Sulfonamide Derivatives Other (See Comments)    Reaction: per patient "tears her stomach up"  . Codeine Nausea And Vomiting  . Erythromycin Nausea And Vomiting  . Hydromorphone Hcl Nausea And Vomiting  . Morphine And Related Nausea And Vomiting  . Nalbuphine Nausea And Vomiting  . Sulfasalazine Nausea Only and Other (See Comments)    per patient "tears her stomach up", "Tears up stomach"  . Tape Rash and Other (See Comments)    No "plastic" tape," please    Patient Measurements: Height: 5\' 1"  (154.9 cm) Weight: 230 lb 3.2 oz (104.4 kg) IBW/kg (Calculated) : 47.8 Heparin Dosing Weight:  73.4 kg  Vital Signs: Temp: 98 F (36.7 C) (09/05 0800) Temp Source: Oral (09/05 0800) BP: 125/71 (09/05 0800) Pulse Rate: 63 (09/05 0800)  Labs: Recent Labs    04/19/18 1844  04/20/18 0352 04/20/18 0740 04/20/18 1536 04/21/18 0307  HGB 11.2*  --   --   --   --  12.0  HCT 35.7*  --   --   --   --  37.9  PLT 223  --   --   --   --  199  HEPARINUNFRC  --   --   --  0.77* 0.55 0.58  CREATININE 0.92  --  0.87  --   --  0.89  TROPONINI  --    < > <0.03 0.03* <0.03  --    < > = values in this interval not displayed.    Estimated Creatinine Clearance: 74.7 mL/min (by C-G formula based on SCr of 0.89 mg/dL).   Assessment: Anticoagulation: h/o PAF and CVA (no anticoag PTA but h/o Eliquis stopped for GIB). Plan cath for ACS symptoms. Heparin level 0.58 remains within goal range on heparin rate 950 units/hr CBC is within normal.  NOTE potential drug interaction --Carbamazepine/Eliquis combo: Plasma  concentrations and pharmacologic effects of Apixaban may be decreased by Strong CYP3A4 and P-gP Inducers. Coadministration of Apixaban and Strong CYP3A4 and P-gP Inducers should be avoided    Goal of Therapy:  Heparin level 0.3-0.7 units/ml Monitor platelets by anticoagulation protocol: Yes   Plan:  Cath today Continue IV heparin at 950 units/hr Heparin level and CBC in AM and daily. Will f/u post cath for anticoagulation plan.   Nicole Cella, RPh Clinical Pharmacist Pager: [phone] Please check AMION for all [place] phone numbers After 10:00 PM, call [place] [phone] 04/21/2018,9:39 AM

## 2018-04-21 NOTE — Progress Notes (Signed)
Pharmacy Review for Dofetilide (Tikosyn) Initiation  Admit Complaint: 62 y.o. female admitted 04/19/2018 with atrial fibrillation to be initiated on dofetilide.   Assessment:  Patient Exclusion Criteria: If any screening criteria checked as "Yes", then  patient  should NOT receive dofetilide until criteria item is corrected. If "Yes" please indicate correction plan.   YES  NO Patient  Exclusion Criteria Correction Plan  []  [x]  Baseline QTc interval is greater than or equal to 440 msec. IF above YES box checked dofetilide contraindicated unless patient has ICD; then may proceed if QTc 500-550 msec or with known ventricular conduction abnormalities may proceed with QTc 550-600 msec. QTc = 0.4   []  [x]  Magnesium level is less than 1.8 mEq/l : Last magnesium:  Lab Results  Component Value Date   MG 1.9 04/20/2018         []  [x]  Potassium level is less than 4 mEq/l : Last potassium:  Lab Results  Component Value Date   K 3.9 04/21/2018         []  [x]  Patient is known or suspected to have a digoxin level greater than 2 ng/ml: No results found for: DIGOXIN    []  [x]  Creatinine clearance less than 20 ml/min (calculated using Cockcroft-Gault, actual body weight and serum creatinine): Estimated Creatinine Clearance: 74.7 mL/min (by C-G formula based on SCr of 0.89 mg/dL).    []  [x]  Patient has received drugs known to prolong the QT intervals within the last 48 hours (phenothiazines, tricyclics or tetracyclic antidepressants, erythromycin, H-1 antihistamines, cisapride, fluoroquinolones, azithromycin). Drugs not listed above may have an, as yet, undetected potential to prolong the QT interval, updated information on QT prolonging agents is available at this website:QT prolonging agents Zofran PRN  []  [x]  Patient received a dose of hydrochlorothiazide (Oretic) alone or in any combination including triamterene (Dyazide, Maxzide) in the last 48 hours.   []  [x]  Patient received a medication known  to increase dofetilide plasma concentrations prior to initial dofetilide dose:  . Trimethoprim (Primsol, Proloprim) in the last 36 hours . Verapamil (Calan, Verelan) in the last 36 hours or a sustained release dose in the last 72 hours . Megestrol (Megace) in the last 5 days  . Cimetidine (Tagamet) in the last 6 hours . Ketoconazole (Nizoral) in the last 24 hours . Itraconazole (Sporanox) in the last 48 hours  . Prochlorperazine (Compazine) in the last 36 hours    []  [x]  Patient is known to have a history of torsades de pointes; congenital or acquired long QT syndromes.   []  [x]  Patient has received a Class 1 antiarrhythmic with less than 2 half-lives since last dose. (Disopyramide, Quinidine, Procainamide, Lidocaine, Mexiletine, Flecainide, Propafenone)   []  [x]  Patient has received amiodarone therapy in the past 3 months or amiodarone level is greater than 0.3 ng/ml.    Patient has been appropriately anticoagulated with heparin. Plan to transition to Coumadin.  Ordering provider was confirmed at [email or web address] if they are not listed on the [place] Prescribers list.  Goal of Therapy: Follow renal function, electrolytes, potential drug interactions, and dose adjustment. Provide education and 1 week supply at discharge.  Plan:  [x]   Physician selected initial dose within range recommended for patients level of renal function - will monitor for response.  []   Physician selected initial dose outside of range recommended for patients level of renal function - will discuss if the dose should be altered at this time.   Select One Calculated CrCl  Dose q12h  [x]  >  60 ml/min 500 mcg  []  40-60 ml/min 250 mcg  []  20-40 ml/min 125 mcg   2. Follow up QTc after the first 5 doses, renal function, electrolytes (K & Mg) daily x 3     days, dose adjustment, success of initiation  Hjalmer Iovino J 5:27 PM 04/21/2018

## 2018-04-21 NOTE — Progress Notes (Signed)
[facility] PROGRESS NOTE  Katherine [name] AUQ:[id number] DOB: 1955-11-12 DOA: 04/19/2018  PCP: [name], MD  Brief History/Interval Summary: 62 y.o. female with medical history significant for coronary artery disease status post CABG and subsequent stents, depression with anxiety, chronic pain, renal transplant recipient, paroxysmal atrial fibrillation not on anticoagulation, and insulin-dependent diabetes mellitus, presented to the emergency department with chest pain.  Patient reports that she was in her usual state of health and having an uneventful day when she developed sudden onset of rapid palpitations while at rest.  This was followed by discomfort across the anterior chest, radiating up into the bilateral jaw, and associated with nausea and diaphoresis.  Patient was seen in the emergency department.  She was given a dose of intravenous Cardizem with improvement in heart rate.  She was hospitalized for further management.  Reason for Visit: Paroxysmal atrial fibrillation.  Chest pain.  Consultants: Cardiology  Procedures: Plan is for cardiac catheterization this afternoon  Antibiotics: None  Subjective/Interval History: Patient states that he could not sleep well last night due to palpitations.  Denies any chest pain.  Somewhat anxious.  No nausea vomiting.    ROS: Denies any headaches  Objective:  Vital Signs  Vitals:   04/21/18 0004 04/21/18 0358 04/21/18 0500 04/21/18 0800  BP: 109/70 124/62  125/71  Pulse: 65 60  63  Resp: 20     Temp: 98 F (36.7 C) 98.3 F (36.8 C)  98 F (36.7 C)  TempSrc: Oral Oral  Oral  SpO2: 93% 97%  99%  Weight:   104.4 kg   Height:        Intake/Output Summary (Last 24 hours) at 04/21/2018 1046 Last data filed at 04/20/2018 2044 Gross per 24 hour  Intake 720 ml  Output 350 ml  Net 370 ml   Filed Weights   04/19/18 1748 04/20/18 0420 04/21/18 0500  Weight: 105.2 kg 104.7 kg 104.4 kg    General appearance: Awake  alert.  In no distress Resp: Normal effort at rest.  Diminished air entry at the bases.  No wheezing rales or rhonchi. Cardio: S1-S2 is irregularly irregular.  No S3-S4 GI: Abdomen is soft.  Nontender nondistended.  Bowel sounds are present.  No masses organomegaly Extremities: No edema Neurologic: Alert and oriented x3.  No focal neurological deficits  Lab Results:  Data Reviewed: I have personally reviewed following labs and imaging studies  CBC: Recent Labs  Lab 04/19/18 1844 04/21/18 0307  WBC 5.5 5.1  HGB 11.2* 12.0  HCT 35.7* 37.9  MCV 98.9 97.9  PLT 223 638    Basic Metabolic Panel: Recent Labs  Lab 04/19/18 1844 04/20/18 0352 04/21/18 0307  NA 139 139 141  K 4.0 4.6 3.9  CL 106 107 108  CO2 22 23 21*  GLUCOSE 218* 185* 151*  BUN 16 14 13   CREATININE 0.92 0.87 0.89  CALCIUM 9.0 8.9 9.1  MG  --  1.9  --     GFR: Estimated Creatinine Clearance: 74.7 mL/min (by C-G formula based on SCr of 0.89 mg/dL).  Cardiac Enzymes: Recent Labs  Lab 04/19/18 2121 04/20/18 0352 04/20/18 0740 04/20/18 1536  TROPONINI 0.03* <0.03 0.03* <0.03    CBG: Recent Labs  Lab 04/20/18 1140 04/20/18 1637 04/20/18 2054 04/21/18 0357 04/21/18 0758  GLUCAP 212* 127* 154* 140* 139*    Thyroid Function Tests: Recent Labs    04/20/18 0740  TSH 3.118    Radiology Studies: Dg Chest 2 View  Result Date: 04/19/2018 CLINICAL DATA:  Chest pain. EXAM: CHEST - 2 VIEW COMPARISON:  Chest x-ray dated February 03, 2018. FINDINGS: The patient is rotated to the left. Stable cardiomegaly status post CABG. Normal pulmonary vascularity. No focal consolidation, pleural effusion, or pneumothorax. No acute osseous abnormality. Multiple broken median sternotomy wires are unchanged. IMPRESSION: Stable cardiomegaly.  No active cardiopulmonary disease. Electronically Signed   By: Titus Dubin M.D.   On: 04/19/2018 18:31     Medications:  Scheduled: . azaTHIOprine  125 mg Oral QPM  .  calcitRIOL  0.25 mcg Oral Q3 days  . carbamazepine  200 mg Oral BID  . clopidogrel  75 mg Oral Daily  . cyclobenzaprine  10 mg Oral BID  . diltiazem  120 mg Oral Daily  . DULoxetine  60 mg Oral q morning - 10a  . famotidine  20 mg Oral Daily  . insulin aspart  0-9 Units Subcutaneous Q4H  . insulin glargine  8 Units Subcutaneous QHS  . LORazepam  1 mg Oral q morning - 10a  . metoprolol tartrate  25 mg Oral BID  . mometasone-formoterol  2 puff Inhalation BID  . montelukast  10 mg Oral QHS  . pantoprazole  40 mg Oral Daily  . predniSONE  5 mg Oral Q breakfast  . rosuvastatin  20 mg Oral q1800  . sertraline  100 mg Oral q morning - 10a  . sodium chloride flush  3 mL Intravenous Q12H   Continuous: . sodium chloride    . sodium chloride 1.003 mL/kg/hr (04/21/18 0445)  . heparin 950 Units/hr (04/20/18 2044)   WYO:VZCHYI chloride, acetaminophen, albuterol, guaiFENesin, nitroGLYCERIN, ondansetron (ZOFRAN) IV, oxyCODONE, promethazine, sodium chloride flush  Assessment/Plan:    Chest pain in the setting of known coronary artery disease Chest pain could have been due to tachyarrhythmia.  No significant elevation in troponin levels.  Patient is on aspirin and Plavix.  Seen by cardiology.  Patient also on beta-blocker and statin.  Plan is for cardiac catheterization today.  Paroxysmal atrial fibrillation She was in atrial fibrillation with RVR at present patient had heart rate of 120s.  She was given diltiazem x1.  She did convert to sinus rhythm.  Appears to be back in atrial fibrillation today.  Electrophysiology is following.  She has 2 vascular score is at least 4.  Not previously anticoagulated due to frequent GI procedures.  TSH normal.  Currently on IV heparin.  On oral Cardizem for rate control.  Echocardiogram last done in March which showed normal systolic function, grade 2 diastolic dysfunction and mild aortic stenosis.  Insulin-dependent diabetes mellitus HbA1c was 7.1 in June.   CBGs are reasonably well controlled.  Continue Lantus and NovoLog.  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  Has a wound VAC in place.  Will need to follow-up with her hand surgeon.  Chronic pain Continue home regimen with Cymbalta Flexeril and oxycodone as needed.  History of depression with anxiety Continue home medications.  Renal transplant recipient Renal function is normal.  Continue prednisone and Imuran.  DVT Prophylaxis: Currently on IV heparin    Code Status: Full code Family Communication: Discussed with the patient Disposition Plan: Plan is for cardiac catheterization this morning.    LOS: 0 days   [facility] Pager [phone] 04/21/2018, 10:46 AM  If 7PM-7AM, please contact night-coverage at [email or web address], password [facility]

## 2018-04-21 NOTE — Care Management Obs Status (Signed)
[place] NOTIFICATION   Patient Details  Name: Katherine [name] MRN: [id number] Date of Birth: Sep 04, 1955   Medicare Observation Status Notification Given:  Yes    Bethena Roys, RN 04/21/2018, 1:07 PM

## 2018-04-21 NOTE — Consult Note (Signed)
[facility] Nurse wound consult note Reason for Consult: Discontinuance of NPWT device placed at [facility] Patient with neoplastic lesion to this area, removed and grafted. Incision on right upper extremity.  Complex medical history includes renal transplant and now cardiac arrhythmia.  For Cardiac Cath today.  Patient will follow up with Plastics as soon as able to for follow up care to her right thumb. Wound type: Surgical, neoplastic Pressure Injury POA: NA Measurement: Right thumb:  3cm x 2cm x 0.1cm  R\ight upper extremity:  6cm closely approximated wound covered by steri strips with dried exudate. Wound bed: Right thumb with fragile wound bed, red, moist but not wet. Drainage (amount, consistency, odor) None Periwound:intact with evidence of grafting, sutures Dressing procedure/placement/frequency: NPWT removed and cannister and tubing discontinued and discarded.  VAC pump is placed into the black cross body bag and placed with patient belongings for her return to the [facility]. PAtient and Bedside RN aware.  Conservative daily wound care orders are provided for both the right thumb (xeroform  for astringent, nonadherent and antimicrobial properties) and the right upper extremity (dry dressing change, every other day) until patient can return to her physicians at [facility] for follow up.  [facility] nursing team will not follow, but will remain available to this patient, the nursing and medical teams.  Please re-consult if needed. Thanks, Maudie Flakes, MSN, RN, [place], Arther Abbott  Pager# [phone]

## 2018-04-21 NOTE — Progress Notes (Signed)
Progress Note  Patient Name: Katherine [name] Date of Encounter: 04/21/2018  Primary Cardiologist: [name], MD   Subjective   No chest pain; Some dyspnea  Inpatient Medications    Scheduled Meds: . azaTHIOprine  125 mg Oral QPM  . calcitRIOL  0.25 mcg Oral Q3 days  . carbamazepine  200 mg Oral BID  . clopidogrel  75 mg Oral Daily  . cyclobenzaprine  10 mg Oral BID  . diltiazem  120 mg Oral Daily  . DULoxetine  60 mg Oral q morning - 10a  . famotidine  20 mg Oral Daily  . insulin aspart  0-9 Units Subcutaneous Q4H  . insulin glargine  8 Units Subcutaneous QHS  . LORazepam  1 mg Oral q morning - 10a  . metoprolol tartrate  25 mg Oral BID  . mometasone-formoterol  2 puff Inhalation BID  . montelukast  10 mg Oral QHS  . pantoprazole  40 mg Oral Daily  . predniSONE  5 mg Oral Q breakfast  . rosuvastatin  20 mg Oral q1800  . sertraline  100 mg Oral q morning - 10a  . sodium chloride flush  3 mL Intravenous Q12H   Continuous Infusions: . sodium chloride    . sodium chloride 1.003 mL/kg/hr (04/21/18 0445)  . heparin 950 Units/hr (04/20/18 2044)   PRN Meds: sodium chloride, acetaminophen, albuterol, guaiFENesin, nitroGLYCERIN, ondansetron (ZOFRAN) IV, oxyCODONE, promethazine, sodium chloride flush   Vital Signs    Vitals:   04/21/18 0004 04/21/18 0358 04/21/18 0500 04/21/18 0800  BP: 109/70 124/62  125/71  Pulse: 65 60  63  Resp: 20     Temp: 98 F (36.7 C) 98.3 F (36.8 C)  98 F (36.7 C)  TempSrc: Oral Oral  Oral  SpO2: 93% 97%  99%  Weight:   104.4 kg   Height:        Intake/Output Summary (Last 24 hours) at 04/21/2018 1046 Last data filed at 04/20/2018 2044 Gross per 24 hour  Intake 720 ml  Output 350 ml  Net 370 ml   Filed Weights   04/19/18 1748 04/20/18 0420 04/21/18 0500  Weight: 105.2 kg 104.7 kg 104.4 kg    Telemetry     Sinus with intermittent atrial flutter- Personally Reviewed  Physical Exam   GEN: No acute distress.   Neck: No  JVD Cardiac: irregular  Respiratory: Clear to auscultation bilaterally. GI: Soft, nontender, non-distended  MS: No edema Neuro:  Nonfocal  Psych: Normal affect   Labs    Chemistry Recent Labs  Lab 04/19/18 1844 04/20/18 0352 04/21/18 0307  NA 139 139 141  K 4.0 4.6 3.9  CL 106 107 108  CO2 22 23 21*  GLUCOSE 218* 185* 151*  BUN 16 14 13   CREATININE 0.92 0.87 0.89  CALCIUM 9.0 8.9 9.1  GFRNONAA >60 >60 >60  GFRAA >60 >60 >60  ANIONGAP 11 9 12      Hematology Recent Labs  Lab 04/19/18 1844 04/21/18 0307  WBC 5.5 5.1  RBC 3.61* 3.87  HGB 11.2* 12.0  HCT 35.7* 37.9  MCV 98.9 97.9  MCH 31.0 31.0  MCHC 31.4 31.7  RDW 14.6 14.8  PLT 223 199    Cardiac Enzymes Recent Labs  Lab 04/19/18 2121 04/20/18 0352 04/20/18 0740 04/20/18 1536  TROPONINI 0.03* <0.03 0.03* <0.03    Recent Labs  Lab 04/19/18 1846  TROPIPOC 0.02     Radiology    Dg Chest 2 View  Result Date: 04/19/2018  CLINICAL DATA:  Chest pain. EXAM: CHEST - 2 VIEW COMPARISON:  Chest x-ray dated February 03, 2018. FINDINGS: The patient is rotated to the left. Stable cardiomegaly status post CABG. Normal pulmonary vascularity. No focal consolidation, pleural effusion, or pneumothorax. No acute osseous abnormality. Multiple broken median sternotomy wires are unchanged. IMPRESSION: Stable cardiomegaly.  No active cardiopulmonary disease. Electronically Signed   By: Titus Dubin M.D.   On: 04/19/2018 18:31    Patient Profile     62 year old female with past medical history of coronary artery disease status post coronary artery bypass and graft, paroxysmal atrial fibrillation, hypertension, diabetes mellitus, hyperlipidemia, prior renal transplant with normal creatinine for evaluation of paroxysmal atrial fibrillation/flutter and chest pain.   Assessment & Plan    1 paroxysmal atrial fibrillation/flutter-patient continues with intermittent atrial flutter. We will continue metoprolol and Cardizem for rate  control.    Dr. Rayann Heman has seen and may initiate Tikosyn following catheterization.  Patient cannot be on apixaban due to potential interaction with carbamazepine.  We will begin heparin/Coumadin following catheterization.    2 angina-plan is for cardiac catheterization later today.  If she requires PCI would favor aspirin/Plavix for 1 month.  We would then discontinue aspirin, continue Plavix and begin Coumadin afterwards.  3 coronary artery disease-continue Plavix and statin for now.  4 hypertension-blood pressure is controlled at present.  Continue present medications and follow.  5 history of renal transplant-follow renal function closely while in hospital.  For questions or updates, please contact [place] Please consult [email or web address] for contact info under Cardiology/STEMI.      Signed, Kirk Ruths, MD  04/21/2018, 10:46 AM

## 2018-04-22 ENCOUNTER — Encounter (HOSPITAL_COMMUNITY): Payer: Self-pay | Admitting: Cardiology

## 2018-04-22 DIAGNOSIS — I4892 Unspecified atrial flutter: Secondary | ICD-10-CM

## 2018-04-22 LAB — BASIC METABOLIC PANEL
ANION GAP: 10 (ref 5–15)
BUN: 8 mg/dL (ref 6–20)
CHLORIDE: 103 mmol/L (ref 98–111)
CO2: 24 mmol/L (ref 22–32)
CREATININE: 0.92 mg/dL (ref 0.44–1.00)
Calcium: 8.6 mg/dL — ABNORMAL LOW (ref 8.9–10.3)
GFR calc non Af Amer: >60 mL/min (ref >60)
Glucose, Bld: 240 mg/dL — ABNORMAL HIGH (ref 70–99)
POTASSIUM: 3.7 mmol/L (ref 3.5–5.1)
SODIUM: 137 mmol/L (ref 135–145)

## 2018-04-22 LAB — GLUCOSE, CAPILLARY
GLUCOSE-CAPILLARY: 205 mg/dL — AB (ref 70–99)
GLUCOSE-CAPILLARY: 162 mg/dL — AB (ref 70–99)
Glucose-Capillary: 238 mg/dL — ABNORMAL HIGH (ref 70–99)
Glucose-Capillary: 115 mg/dL — ABNORMAL HIGH (ref 70–99)
Glucose-Capillary: 163 mg/dL — ABNORMAL HIGH (ref 70–99)

## 2018-04-22 LAB — MAGNESIUM: MAGNESIUM: 2.3 mg/dL (ref 1.7–2.4)

## 2018-04-22 LAB — CBC
HEMATOCRIT: 37.7 % (ref 36.0–46.0)
HEMOGLOBIN: 11.9 g/dL — AB (ref 12.0–15.0)
MCH: 31.2 pg (ref 26.0–34.0)
MCHC: 31.6 g/dL (ref 30.0–36.0)
MCV: 98.7 fL (ref 78.0–100.0)
Platelets: 192 10*3/uL (ref 150–400)
RBC: 3.82 MIL/uL — AB (ref 3.87–5.11)
RDW: 15 % (ref 11.5–15.5)
WBC: 5.6 10*3/uL (ref 4.0–10.5)

## 2018-04-22 LAB — PROTIME-INR
INR: 1.03
Prothrombin Time: 13.4 seconds (ref 11.4–15.2)

## 2018-04-22 LAB — HEPARIN LEVEL (UNFRACTIONATED): HEPARIN UNFRACTIONATED: 0.31 [IU]/mL (ref 0.30–0.70)

## 2018-04-22 MED ORDER — WARFARIN - PHARMACIST DOSING INPATIENT
Freq: Every day | Status: DC
  Administered 2018-04-22: 18:00:00

## 2018-04-22 MED ORDER — INSULIN ASPART 100 UNIT/ML ~~LOC~~ SOLN
0.0000 [IU] | Freq: Three times a day (TID) | SUBCUTANEOUS | Status: DC
  Administered 2018-04-22: 2 [IU] via SUBCUTANEOUS
  Administered 2018-04-22: 3 [IU] via SUBCUTANEOUS
  Administered 2018-04-23: 1 [IU] via SUBCUTANEOUS
  Administered 2018-04-23 (×2): 7 [IU] via SUBCUTANEOUS
  Administered 2018-04-24: 2 [IU] via SUBCUTANEOUS
  Administered 2018-04-24: 5 [IU] via SUBCUTANEOUS
  Administered 2018-04-24: 3 [IU] via SUBCUTANEOUS
  Administered 2018-04-25: 2 [IU] via SUBCUTANEOUS
  Administered 2018-04-25: 5 [IU] via SUBCUTANEOUS
  Administered 2018-04-25: 7 [IU] via SUBCUTANEOUS
  Administered 2018-04-26: 2 [IU] via SUBCUTANEOUS
  Administered 2018-04-26: 5 [IU] via SUBCUTANEOUS

## 2018-04-22 MED ORDER — HEPARIN (PORCINE) IN NACL 100-0.45 UNIT/ML-% IJ SOLN
950.0000 [IU]/h | INTRAMUSCULAR | Status: DC
  Administered 2018-04-22 – 2018-04-25 (×3): 950 [IU]/h via INTRAVENOUS
  Filled 2018-04-22 (×4): qty 250

## 2018-04-22 MED ORDER — POTASSIUM CHLORIDE CRYS ER 20 MEQ PO TBCR
40.0000 meq | EXTENDED_RELEASE_TABLET | Freq: Once | ORAL | Status: AC
  Administered 2018-04-22: 40 meq via ORAL
  Filled 2018-04-22: qty 2

## 2018-04-22 MED ORDER — WARFARIN SODIUM 5 MG PO TABS
10.0000 mg | ORAL_TABLET | Freq: Once | ORAL | Status: AC
  Administered 2018-04-22: 17:00:00 10 mg via ORAL
  Filled 2018-04-22: qty 2

## 2018-04-22 MED ORDER — DOFETILIDE 500 MCG PO CAPS: 500.0000 ug | ORAL_CAPSULE | Freq: Two times a day (BID) | ORAL | Status: DC

## 2018-04-22 MED ORDER — DOFETILIDE 250 MCG PO CAPS
250.0000 ug | ORAL_CAPSULE | Freq: Two times a day (BID) | ORAL | Status: DC
  Administered 2018-04-23 – 2018-04-26 (×7): 250 ug via ORAL
  Filled 2018-04-22 (×8): qty 1
  Filled 2018-04-22: qty 14

## 2018-04-22 MED ORDER — SODIUM CHLORIDE 0.9 % IV SOLN
INTRAVENOUS | Status: DC | PRN
  Administered 2018-04-22: 10 mL/h via INTRAVENOUS

## 2018-04-22 NOTE — Progress Notes (Signed)
ANTICOAGULATION CONSULT NOTE - Follow Up Consult  Pharmacy Consult for Heparin Indication: Afib and ACS  Allergies  Allergen Reactions  . Tetracycline Hives  . Hydromorphone Hcl Nausea And Vomiting  . Niacin Other (See Comments)    Mouth blisters  . Niaspan [Niacin Er] Other (See Comments)    Mouth blisters  . Sulfa Antibiotics Nausea Only and Other (See Comments)    "Tears up stomach"  . Sulfonamide Derivatives Other (See Comments)    Reaction: per patient "tears her stomach up"  . Codeine Nausea And Vomiting  . Erythromycin Nausea And Vomiting  . Hydromorphone Hcl Nausea And Vomiting  . Morphine And Related Nausea And Vomiting  . Nalbuphine Nausea And Vomiting  . Sulfasalazine Nausea Only and Other (See Comments)    per patient "tears her stomach up", "Tears up stomach"  . Tape Rash and Other (See Comments)    No "plastic" tape," please    Patient Measurements: Height: 5\' 1"  (154.9 cm) Weight: 230 lb 2.6 oz (104.4 kg) IBW/kg (Calculated) : 47.8 Heparin Dosing Weight:  73.4 kg  Vital Signs: Temp: 97.6 F (36.4 C) (09/05 2102) Temp Source: Axillary (09/05 2102) BP: 116/63 (09/06 0000) Pulse Rate: 82 (09/05 2155)  Labs: Recent Labs    04/19/18 1844  04/20/18 0352 04/20/18 0740 04/20/18 1536 04/21/18 0307 04/22/18 0228 04/22/18 0309  HGB 11.2*  --   --   --   --  12.0  --  11.9*  HCT 35.7*  --   --   --   --  37.9  --  37.7  PLT 223  --   --   --   --  199  --  192  HEPARINUNFRC  --   --   --  0.77* 0.55 0.58  --   --   CREATININE 0.92  --  0.87  --   --  0.89 0.92  --   TROPONINI  --    < > <0.03 0.03* <0.03  --   --   --    < > = values in this interval not displayed.    Estimated Creatinine Clearance: 72.3 mL/min (by C-G formula based on SCr of 0.92 mg/dL).   Assessment: Heparin to restart post cath.  Goal of Therapy:  Heparin level 0.3-0.7 units/ml Monitor platelets by anticoagulation protocol: Yes   Plan:  Restart IV heparin at 950  units/hr Heparin level in 6-8 hourd Will f/u for anticoagulation plan.   Thanks for allowing pharmacy to be a part of this patient's care.  Excell Seltzer, PharmD Clinical Pharmacist

## 2018-04-22 NOTE — Progress Notes (Addendum)
Electrophysiology Rounding Note  Patient Name: Katherine [name] No Date of Encounter: 04/22/2018  Primary Cardiologist: Ellyn Hack Electrophysiologist: Aissata Wilmore   Subjective   The patient is doing well today.  At this time, the patient denies chest pain, shortness of breath, or any new concerns. She is tired today  Inpatient Medications    Scheduled Meds: . azaTHIOprine  125 mg Oral QPM  . calcitRIOL  0.25 mcg Oral Q3 days  . carbamazepine  200 mg Oral BID  . cyclobenzaprine  10 mg Oral BID  . diltiazem  120 mg Oral Daily  . dofetilide  500 mcg Oral BID  . DULoxetine  60 mg Oral q morning - 10a  . famotidine  20 mg Oral Daily  . insulin aspart  0-9 Units Subcutaneous TID WC  . insulin glargine  8 Units Subcutaneous QHS  . LORazepam  1 mg Oral q morning - 10a  . metoprolol tartrate  25 mg Oral BID  . mometasone-formoterol  2 puff Inhalation BID  . montelukast  10 mg Oral QHS  . pantoprazole  40 mg Oral Daily  . potassium chloride  40 mEq Oral Once  . predniSONE  5 mg Oral Q breakfast  . rosuvastatin  20 mg Oral q1800  . sertraline  100 mg Oral q morning - 10a  . sodium chloride flush  3 mL Intravenous Q12H  . sodium chloride flush  3 mL Intravenous Q12H   Continuous Infusions: . sodium chloride    . sodium chloride    . sodium chloride 10 mL/hr (04/22/18 0503)  . heparin 950 Units/hr (04/22/18 0505)   PRN Meds: sodium chloride, sodium chloride, sodium chloride, acetaminophen, albuterol, fentaNYL (SUBLIMAZE) injection, guaiFENesin, nitroGLYCERIN, oxyCODONE, promethazine, sodium chloride flush, sodium chloride flush   Vital Signs    Vitals:   04/21/18 2300 04/22/18 0000 04/22/18 0511 04/22/18 0715  BP: 117/76 116/63 117/71 (!) 131/50  Pulse:   73 77  Resp: (!) 34 (!) 29 18 18   Temp:   97.8 F (36.6 C) 98.3 F (36.8 C)  TempSrc:   Oral Oral  SpO2:   100% 96%  Weight:   104.4 kg   Height:        Intake/Output Summary (Last 24 hours) at 04/22/2018 0938 Last data  filed at 04/22/2018 0245 Gross per 24 hour  Intake 3139.28 ml  Output 400 ml  Net 2739.28 ml   Filed Weights   04/21/18 0500 04/21/18 2102 04/22/18 0511  Weight: 104.4 kg 104.4 kg 104.4 kg    Physical Exam    GEN- The patient is well appearing, alert and oriented x 3 today.   Head- normocephalic, atraumatic Eyes-  Sclera clear, conjunctiva pink Ears- hearing intact Oropharynx- clear Neck- supple Lungs- Clear to ausculation bilaterally, normal work of breathing Heart- Regular rate and rhythm  GI- soft, NT, ND, + BS Extremities- no clubbing, cyanosis, or edema Skin- no rash or lesion Psych- euthymic mood, full affect Neuro- strength and sensation are intact  Labs    CBC Recent Labs    04/21/18 0307 04/22/18 0309  WBC 5.1 5.6  HGB 12.0 11.9*  HCT 37.9 37.7  MCV 97.9 98.7  PLT 199 237   Basic Metabolic Panel Recent Labs    04/21/18 0307 04/21/18 1642 04/22/18 0228  NA 141  --  137  K 3.9 4.4 3.7  CL 108  --  103  CO2 21*  --  24  GLUCOSE 151*  --  240*  BUN 13  --  8  CREATININE 0.89  --  0.92  CALCIUM 9.1  --  8.6*  MG  --  1.7 2.3    Cardiac Enzymes Recent Labs    04/20/18 0352 04/20/18 0740 04/20/18 1536  TROPONINI <0.03 0.03* <0.03    Thyroid Function Tests Recent Labs    04/20/18 0740  TSH 3.118    Telemetry    SR, PVC's (personally reviewed)  Radiology    No results found.   Assessment & Plan    1.  Paroxysmal atrial fibrillation Tikosyn started last night QTc reviewed by Dr Rayann Heman this morning - 450msec, ok to continue  BMET, Mg stable Will need to convert Heparin to [facility] - with current medications, Warfarin is her only option that does not have drug-drug interactions Will start today She will be here through Sunday morning at least for tikosyn load  2.  CAD See cath results yesterday Plan medical therapy No ASA/Plavix with need for Warfarin  3.  HTN Stable No change required today     For questions or updates, please  contact [place] Please consult [email or web address] for contact info under Cardiology/STEMI.  Signed, Chanetta Marshall, NP  04/22/2018, 9:38 AM   I have seen, examined the patient, and reviewed the above assessment and plan.  Changes to above are made where necessary.  On exam, RRR.  QT has lengthened after one dose of tikosyn.  Will hold next dose and then resume 250 mcg BID.  Will need observation over the weekend.  Stop heparin drip and start eliquis 5mg  BID at this time.  EP to follow over the weekend.  Co Sign: Thompson Grayer, MD 04/22/2018 2:32 PM

## 2018-04-22 NOTE — Progress Notes (Signed)
[facility] PROGRESS NOTE  Katherine [name] ENI:[id number] DOB: [birth year] DOA: 04/19/2018  PCP: [name], MD  Brief History/Interval Summary: 62 y.o. female with medical history significant for coronary artery disease status post CABG and subsequent stents, depression with anxiety, chronic pain, renal transplant recipient, paroxysmal atrial fibrillation not on anticoagulation, and insulin-dependent diabetes mellitus, presented to the emergency department with chest pain.  Patient reports that she was in her usual state of health and having an uneventful day when she developed sudden onset of rapid palpitations while at rest.  This was followed by discomfort across the anterior chest, radiating up into the bilateral jaw, and associated with nausea and diaphoresis.  Patient was seen in the emergency department.  She was given a dose of intravenous Cardizem with improvement in heart rate.  She was hospitalized for further management.  Reason for Visit: Paroxysmal atrial fibrillation.  Chest pain.  Consultants: Cardiology  Procedures:  Cardiac catheterization 9/5 Conclusion     LV end diastolic pressure is normal.  _________________________________________________  Colon Flattery LAD to Prox LAD lesion is 50% stenosed. Prox LAD-1 lesion is 100% stenosed prior to D1. Prox LAD-2 lesion is 100% stenosed after D1.  SVG-D1 graft was visualized by angiography and is large. Dist Graft to Insertion STENT is 20% stenosed.  The Native Cx is widely patent with a small 1st Marg & 2 Small distal LPL branches.  Ost 1st Mrg lesion is 90% stenosed. Small Caliber Vessel - not good PCI/PTCA target  Ost RCA to Dist RCA lesion is 100% stenosed. - Unable to engage. (known occlusion)  SVG-dRCA graft was visualized by angiography and is large. The graft exhibits minimal luminal irregularities.  LIMA graft was not injected. 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   Severe native coronary disease with occluded RCA and proximal LAD.  Severe, but stable ostial OM1 disease Patent SVG-D1 with stent in place. Patent SVG-dRCA Due to access issues, unable to visualize LIMA graft. Severe focal calcified lesion in the right common femoral artery: Unable to obtain femoral arterial access despite using ultrasound guidance -unable to advance wire.    Antibiotics: None  Subjective/Interval History: Patient denies any chest pain or shortness of breath.  Somewhat anxious.  Denies any abdominal pain nausea or vomiting.  ROS: Denies any headaches.  Objective:  Vital Signs  Vitals:   04/21/18 2300 04/22/18 0000 04/22/18 0511 04/22/18 0715  BP: 117/76 116/63 117/71 (!) 131/50  Pulse:   73 77  Resp: (!) 34 (!) 29 18 18   Temp:   97.8 F (36.6 C) 98.3 F (36.8 C)  TempSrc:   Oral Oral  SpO2:   100% 96%  Weight:   104.4 kg   Height:        Intake/Output Summary (Last 24 hours) at 04/22/2018 1106 Last data filed at 04/22/2018 0245 Gross per 24 hour  Intake 3139.28 ml  Output 400 ml  Net 2739.28 ml   Filed Weights   04/21/18 0500 04/21/18 2102 04/22/18 0511  Weight: 104.4 kg 104.4 kg 104.4 kg    General appearance: Awake alert.  In no distress Resp: Normal effort at rest.  Diminished air entry at the bases.  No wheezing rales or rhonchi.   Cardio: S1-S2 is normal regular this  morning.  No S3-S4.   GI: Abdomen is soft.  Nontender nondistended.  Bowel sounds are present.  No masses organomegaly. Extremities: No significant edema Neurologic: Alert and oriented x3.  No focal neurological deficits.  Lab Results:  Data Reviewed: I have personally reviewed following labs and imaging studies  CBC: Recent  Labs  Lab 04/19/18 1844 04/21/18 0307 04/22/18 0309  WBC 5.5 5.1 5.6  HGB 11.2* 12.0 11.9*  HCT 35.7* 37.9 37.7  MCV 98.9 97.9 98.7  PLT 223 199 950    Basic Metabolic Panel: Recent Labs  Lab 04/19/18 1844 04/20/18 0352 04/21/18 0307 04/21/18 1642 04/22/18 0228  NA 139 139 141  --  137  K 4.0 4.6 3.9 4.4 3.7  CL 106 107 108  --  103  CO2 22 23 21*  --  24  GLUCOSE 218* 185* 151*  --  240*  BUN 16 14 13   --  8  CREATININE 0.92 0.87 0.89  --  0.92  CALCIUM 9.0 8.9 9.1  --  8.6*  MG  --  1.9  --  1.7 2.3    GFR: Estimated Creatinine Clearance: 72.3 mL/min (by C-G formula based on SCr of 0.92 mg/dL).  Cardiac Enzymes: Recent Labs  Lab 04/19/18 2121 04/20/18 0352 04/20/18 0740 04/20/18 1536  TROPONINI 0.03* <0.03 0.03* <0.03    CBG: Recent Labs  Lab 04/21/18 1127 04/21/18 1653 04/21/18 2105 04/22/18 0110 04/22/18 0619  GLUCAP 153* 108* 129* 238* 115*    Thyroid Function Tests: Recent Labs    04/20/18 0740  TSH 3.118    Radiology Studies: No results found.   Medications:  Scheduled: . azaTHIOprine  125 mg Oral QPM  . calcitRIOL  0.25 mcg Oral Q3 days  . carbamazepine  200 mg Oral BID  . cyclobenzaprine  10 mg Oral BID  . diltiazem  120 mg Oral Daily  . dofetilide  500 mcg Oral BID  . DULoxetine  60 mg Oral q morning - 10a  . famotidine  20 mg Oral Daily  . insulin aspart  0-9 Units Subcutaneous TID WC  . insulin glargine  8 Units Subcutaneous QHS  . LORazepam  1 mg Oral q morning - 10a  . metoprolol tartrate  25 mg Oral BID  . mometasone-formoterol  2 puff Inhalation BID  . montelukast  10 mg Oral QHS  . pantoprazole  40 mg Oral Daily  . predniSONE  5 mg Oral Q breakfast  . rosuvastatin  20 mg Oral q1800  . sertraline  100 mg Oral q morning - 10a  . sodium chloride flush  3 mL Intravenous Q12H  . sodium chloride flush  3 mL Intravenous Q12H   Continuous: . sodium chloride    . sodium chloride    . sodium chloride 10 mL/hr (04/22/18  0503)  . heparin 950 Units/hr (04/22/18 0505)   DTO:IZTIWP chloride, sodium chloride, sodium chloride, acetaminophen, albuterol, fentaNYL (SUBLIMAZE) injection, guaiFENesin, nitroGLYCERIN, oxyCODONE, promethazine, sodium chloride flush, sodium chloride flush  Assessment/Plan:    Chest pain in the setting of known coronary artery disease Chest pain could have been due to tachyarrhythmia.  No significant elevation in troponin levels.  Patient is on aspirin and Plavix.  Seen by cardiology.  Patient also on beta-blocker and statin.  Patient underwent cardiac catheterization.  No intervention was performed.  Patient remains chest pain-free.  Low-dose aspirin was recommended by interventional cardiologist.  However patient was on Plavix.  It appears that both of these agents have  been stopped as patient needs to be on warfarin.  Paroxysmal atrial fibrillation She was in atrial fibrillation with RVR at present patient had heart rate of 120s.  She was given diltiazem x1.  She did convert to sinus rhythm.  She converted back to atrial fibrillation.  Electrophysiology has also been following.  Patient started on Tikosyn.  Monitor QT interval.  Potassium being supplemented.  Magnesium level is normal.  Patient's Chad2 vascular score is at least 4.  Not previously anticoagulated due to frequent GI procedures.  TSH normal.  Currently on IV heparin.  Plan is to place her on warfarin.  On oral Cardizem for rate control.  Echocardiogram last done in March which showed normal systolic function, grade 2 diastolic dysfunction and mild aortic stenosis.  Insulin-dependent diabetes mellitus HbA1c was 7.1 in June.  CBGs are reasonably well controlled.  Continue Lantus and NovoLog.  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  She had a wound VAC in place which was removed yesterday.  She will need to follow-up with her hand surgeon next week.    Chronic pain Continue  home regimen with Cymbalta Flexeril and oxycodone as needed.  History of depression with anxiety Continue home medications.  Renal transplant recipient Renal function is normal.  Continue prednisone and Imuran.  DVT Prophylaxis: Currently on IV heparin    Code Status: Full code Family Communication: Discussed with the patient Disposition Plan: Plan is for cardiac catheterization this morning.    LOS: 1 day   [place] [facility] Pager [phone] 04/22/2018, 11:06 AM  If 7PM-7AM, please contact night-coverage at [email or web address], password [facility]

## 2018-04-22 NOTE — Discharge Instructions (Addendum)
You have an appointment set up with the [facility].  Multiple studies have shown that being followed by a dedicated atrial fibrillation clinic in addition to the standard care you receive from your other physicians improves health. We believe that enrollment in the atrial fibrillation clinic will allow [place] to better care for you.   The phone number to the [facility] is [phone]. The clinic is staffed Monday through Friday from 8:30am to 5pm.  Parking Directions: The clinic is located in the Heart and Vascular Building connected to [facility]. 1)From [address] turn on to [address] and go to the 3rd entrance  (Heart and Vascular entrance) on the right. 2)Look to the right for Heart &Vascular Parking Garage. 3)A code for the entrance is required please call the clinic to receive this.   4)Take the elevators to the 1st floor. Registration is in the room with the glass walls at the end of the hallway.  If you have any trouble parking or locating the clinic, please dont hesitate to call [phone].  Information on my medicine - Coumadin   (Warfarin)  Why was Coumadin prescribed for you? Coumadin was prescribed for you because you have a blood clot or a medical condition that can cause an increased risk of forming blood clots. Blood clots can cause serious health problems by blocking the flow of blood to the heart, lung, or brain. Coumadin can prevent harmful blood clots from forming. As a reminder your indication for Coumadin is:   Stroke Prevention Because Of Atrial Fibrillation  What test will check on my response to Coumadin? While on Coumadin (warfarin) you will need to have an INR test regularly to ensure that your dose is keeping you in the desired range. The INR (international normalized ratio) number is calculated from the result of the laboratory test called prothrombin time (PT).  If an INR APPOINTMENT HAS NOT ALREADY BEEN MADE FOR  YOU please schedule an appointment to have this lab work done by your health care provider within 7 days. Your INR goal is usually a number between:  2 to 3 or your provider may give you a more narrow range like 2-2.5.  Ask your health care provider during an office visit what your goal INR is.  What  do you need to  know  About  COUMADIN? Take Coumadin (warfarin) exactly as prescribed by your healthcare provider about the same time each day.  DO NOT stop taking without talking to the doctor who prescribed the medication.  Stopping without other blood clot prevention medication to take the place of Coumadin may increase your risk of developing a new clot or stroke.  Get refills before you run out.  What do you do if you miss a dose? If you miss a dose, take it as soon as you remember on the same day then continue your regularly scheduled regimen the next day.  Do not take two doses of Coumadin at the same time.  Important Safety Information A possible side effect of Coumadin (Warfarin) is an increased risk of bleeding. You should call your healthcare provider right away if you experience any of the following: ? Bleeding from an injury or your nose that does not stop. ? Unusual colored urine (red or dark brown) or unusual colored stools (red or black). ? Unusual bruising for unknown reasons. ? A serious fall or if you hit your head (even if there is no bleeding).  Some foods or medicines  interact with Coumadin (warfarin) and might alter your response to warfarin. To help avoid this: ? Eat a balanced diet, maintaining a consistent amount of Vitamin K. ? Notify your provider about major diet changes you plan to make. ? Avoid alcohol or limit your intake to 1 drink for women and 2 drinks for men per day. (1 drink is 5 oz. wine, 12 oz. beer, or 1.5 oz. liquor.)  Make sure that ANY health care provider who prescribes medication for you knows that you are taking Coumadin (warfarin).  Also make sure  the healthcare provider who is monitoring your Coumadin knows when you have started a new medication including herbals and non-prescription products.  Coumadin (Warfarin)  Major Drug Interactions  Increased Warfarin Effect Decreased Warfarin Effect  Alcohol (large quantities) Antibiotics (esp. Septra/Bactrim, Flagyl, Cipro) Amiodarone (Cordarone) Aspirin (ASA) Cimetidine (Tagamet) Megestrol (Megace) NSAIDs (ibuprofen, naproxen, etc.) Piroxicam (Feldene) Propafenone (Rythmol SR) Propranolol (Inderal) Isoniazid (INH) Posaconazole (Noxafil) Barbiturates (Phenobarbital) Carbamazepine (Tegretol) Chlordiazepoxide (Librium) Cholestyramine (Questran) Griseofulvin Oral Contraceptives Rifampin Sucralfate (Carafate) Vitamin K   Coumadin (Warfarin) Major Herbal Interactions  Increased Warfarin Effect Decreased Warfarin Effect  Garlic Ginseng Ginkgo biloba Coenzyme Q10 Green tea St. Johns wort    Coumadin (Warfarin) FOOD Interactions  Eat a consistent number of servings per week of foods HIGH in Vitamin K (1 serving =  cup)  Collards (cooked, or boiled & drained) Kale (cooked, or boiled & drained) Mustard greens (cooked, or boiled & drained) Parsley *serving size only =  cup Spinach (cooked, or boiled & drained) Swiss chard (cooked, or boiled & drained) Turnip greens (cooked, or boiled & drained)  Eat a consistent number of servings per week of foods MEDIUM-HIGH in Vitamin K (1 serving = 1 cup)  Asparagus (cooked, or boiled & drained) Broccoli (cooked, boiled & drained, or raw & chopped) Brussel sprouts (cooked, or boiled & drained) *serving size only =  cup Lettuce, raw (green leaf, endive, romaine) Spinach, raw Turnip greens, raw & chopped   These websites have more information on Coumadin (warfarin):  FailFactory.se; VeganReport.com.au;

## 2018-04-22 NOTE — Care Management Note (Signed)
Case Management Note  Patient Details  Name: Katherine [name] MRN: [id number] Date of Birth: 1955/11/18  Subjective/Objective:  Pt presented for Chest Pain- PAF. PTA from home with support of husband. Patient uses [facility]. Patient has Medicare and Medicaid secondary. Plan for Tikosyn once stable.  Tikosyn should be no more than $3.00 with Medicaid.                Action/Plan: CM will assist with the Rx for 7 day supply no refills and then pt will need additional Rx with refills. Wound VAC hs been removed from thumb. CM will follow to see if need [facility] RN once stable to transition home.   Expected Discharge Date:  04/22/18               Expected Discharge Plan:  [place]  In-House Referral:     Discharge planning Services  CM Consult  Post Acute Care Choice:    Choice offered to:     DME Arranged:    DME Agency:     HH Arranged:    HH Agency:     Status of Service:  In process, will continue to follow  If discussed at Long Length of Stay Meetings, dates discussed:    Additional Comments:  Bethena Roys, RN 04/22/2018, 3:44 PM

## 2018-04-22 NOTE — Progress Notes (Addendum)
Progress Note  Patient Name: Katherine [name] Date of Encounter: 04/22/2018  Primary Cardiologist:  [name], MD  Subjective   Tired today, no CP, chronic DOE, no palpitations.  Inpatient Medications    Scheduled Meds: . azaTHIOprine  125 mg Oral QPM  . calcitRIOL  0.25 mcg Oral Q3 days  . carbamazepine  200 mg Oral BID  . clopidogrel  75 mg Oral Daily  . cyclobenzaprine  10 mg Oral BID  . diltiazem  120 mg Oral Daily  . dofetilide  500 mcg Oral BID  . DULoxetine  60 mg Oral q morning - 10a  . famotidine  20 mg Oral Daily  . insulin aspart  0-9 Units Subcutaneous TID WC  . insulin glargine  8 Units Subcutaneous QHS  . LORazepam  1 mg Oral q morning - 10a  . metoprolol tartrate  25 mg Oral BID  . mometasone-formoterol  2 puff Inhalation BID  . montelukast  10 mg Oral QHS  . pantoprazole  40 mg Oral Daily  . predniSONE  5 mg Oral Q breakfast  . rosuvastatin  20 mg Oral q1800  . sertraline  100 mg Oral q morning - 10a  . sodium chloride flush  3 mL Intravenous Q12H  . sodium chloride flush  3 mL Intravenous Q12H   Continuous Infusions: . sodium chloride    . sodium chloride    . sodium chloride 10 mL/hr (04/22/18 0503)  . heparin 950 Units/hr (04/22/18 0505)   PRN Meds: sodium chloride, sodium chloride, sodium chloride, acetaminophen, albuterol, fentaNYL (SUBLIMAZE) injection, guaiFENesin, nitroGLYCERIN, oxyCODONE, promethazine, sodium chloride flush, sodium chloride flush   Vital Signs    Vitals:   04/21/18 2155 04/21/18 2300 04/22/18 0000 04/22/18 0511  BP: (!) 152/65 117/76 116/63 117/71  Pulse: 82   73  Resp:  (!) 34 (!) 29 18  Temp:    97.8 F (36.6 C)  TempSrc:    Oral  SpO2:    100%  Weight:    104.4 kg  Height:        Intake/Output Summary (Last 24 hours) at 04/22/2018 0814 Last data filed at 04/22/2018 0245 Gross per 24 hour  Intake 3139.28 ml  Output 400 ml  Net 2739.28 ml   Filed Weights   04/21/18 0500 04/21/18 2102 04/22/18 0511    Weight: 104.4 kg 104.4 kg 104.4 kg    Telemetry    Atrial fib and/or flutter w/ PVCs and short runs NSVT>>SR w/ morphology change at times and PVCs - Personally Reviewed  ECG      09/04, 20:50 Atrial fib, HR 84, no acute ischemic changes - Personally Reviewed 09/06 ECG reviewed by Dr Rayann Heman, no sig change in QT/QTc, SR   Physical Exam   General: Well developed, well nourished, female in no acute distress Head: Eyes PERRLA, No xanthomas.   Normocephalic and atraumatic Lungs: Clear bilaterally to auscultation. Heart: HRRR S1 S2, without RG. Soft SEM. Pulses are 2+ & equal. No JVD seen, difficult to assess 2nd body habitus Abdomen: Bowel sounds are present, abdomen soft and non-tender without masses or  hernias noted. Msk: Normal strength and tone for age. Extremities: No clubbing, cyanosis or edema. R radial cath site w/out ecchymosis or hematoma Skin:  No rashes or lesions noted. R thumb bandaged, not disturbed Neuro: Alert and oriented X 3. Psych:  Good affect, responds appropriately   Labs    Hematology Recent Labs  Lab 04/19/18 1844 04/21/18 0307 04/22/18 0309  WBC  5.5 5.1 5.6  RBC 3.61* 3.87 3.82*  HGB 11.2* 12.0 11.9*  HCT 35.7* 37.9 37.7  MCV 98.9 97.9 98.7  MCH 31.0 31.0 31.2  MCHC 31.4 31.7 31.6  RDW 14.6 14.8 15.0  PLT 223 199 192    Chemistry Recent Labs  Lab 04/20/18 0352 04/21/18 0307 04/21/18 1642 04/22/18 0228  NA 139 141  --  137  K 4.6 3.9 4.4 3.7  CL 107 108  --  103  CO2 23 21*  --  24  GLUCOSE 185* 151*  --  240*  BUN 14 13  --  8  CREATININE 0.87 0.89  --  0.92  CALCIUM 8.9 9.1  --  8.6*  GFRNONAA >60 >60  --  >60  GFRAA >60 >60  --  >60  ANIONGAP 9 12  --  10     Cardiac Enzymes Recent Labs  Lab 04/19/18 2121 04/20/18 0352 04/20/18 0740 04/20/18 1536  TROPONINI 0.03* <0.03 0.03* <0.03    Recent Labs  Lab 04/19/18 1846  TROPIPOC 0.02     Radiology    Dg Chest 2 View  Result Date: 04/19/2018 CLINICAL DATA:  Chest  pain. EXAM: CHEST - 2 VIEW COMPARISON:  Chest x-ray dated February 03, 2018. FINDINGS: The patient is rotated to the left. Stable cardiomegaly status post CABG. Normal pulmonary vascularity. No focal consolidation, pleural effusion, or pneumothorax. No acute osseous abnormality. Multiple broken median sternotomy wires are unchanged. IMPRESSION: Stable cardiomegaly.  No active cardiopulmonary disease. Electronically Signed   By: Titus Dubin M.D.   On: 04/19/2018 18:31     Cardiac Studies   CARDIAC CATH: 95/63/8756  LV end diastolic pressure is normal.  _________________________________________________  Colon Flattery LAD to Prox LAD lesion is 50% stenosed. Prox LAD-1 lesion is 100% stenosed prior to D1. Prox LAD-2 lesion is 100% stenosed after D1.  SVG-D1 graft was visualized by angiography and is large. Dist Graft to Insertion STENT is 20% stenosed.  The Native Cx is widely patent with a small 1st Marg & 2 Small distal LPL branches.  Ost 1st Mrg lesion is 90% stenosed. Small Caliber Vessel - not good PCI/PTCA target  Ost RCA to Dist RCA lesion is 100% stenosed. - Unable to engage. (known occlusion)  SVG-dRCA graft was visualized by angiography and is large. The graft exhibits minimal luminal irregularities.  LIMA graft was not injected. 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   Severe native coronary disease with occluded RCA and proximal LAD.  Severe, but stable ostial OM1 disease Patent SVG-D1 with stent in place. Patent SVG-dRCA Due to access issues, unable to visualize LIMA graft. Severe focal calcified lesion in the right common femoral artery: Unable to obtain femoral arterial access despite using ultrasound  guidance -unable to advance wire.  No obvious lesions found for PCI.  Unfortunately still unable to evaluate the LIMA graft, however this is unlikely to have an issue as it was widely patent and previous cath. May need to consider noninvasive evaluation versus left femoral access if necessary to visualize LIMA.  Patient will be transferred to [facility] postprocedural unit for Rad Stat removal.  Restart IV heparin 8 hours after sheath removal.  Continue treatment of A. fib RVR, I suspect that her anginal symptoms were related  to A. fib RVR with existing CAD and supply versus demand ischemia.   Recommend Aspirin 81mg  daily for moderate CAD.    Patient Profile     62 y.o. female w/ hx CABGx3 (LIMA-LAD, SVG-D1, SVG-RPDA.) followed by PCI x5 from [phone] as detailed in HPI below, PAD/Carotid disease, PAF, inf-post STEMI 1994, 2012 TIA/stroke, COPD, uncontrolled and insulin dependent DM2, HTN, HLD, morbid obesity s/p lap band surgery, OSA on CPAP and 2L night O2, and s/p 1991 renal transplant for ESRD dating back to 78.   Pt admitted 09/03 w/ CP and rapid atrial fib. S/p spont conversion to SR.   Assessment & Plan     1. Parox Afib/flutter - seen by EP, started on Tikosyn - had some vent ectopy, follow QT/QTc closely - **spoke w/ pharmacist, no OACs due to her other meds, will start coumadin** - discuss changing heparin to lovenox - 5th Tikosyn dose will be Sunday am - with addition of Tikosyn, discuss if she has to be therapeutic w/ coumadin before d/c or go home w/ lovenox.  2. CAD - s/p cath, angina felt 2nd supply-demand mismatch due to rapid afib - continue CRF reduction - [name] recommends ASA, currently she is on Plavix - with addition of coumadin, ?change Plavix to ASA  3. Anticoag - on heparin, ?change to lovenox - add coumadin - CHA2DS2-VASc = 6  Otherwise, per IM Active Problems:   Sleep apnea   Extrinsic asthma   S/P CABG (coronary artery bypass graft) x  3   Hypertension associated with diabetes (HCC)   Paroxysmal atrial fibrillation (HCC)   Type 2 diabetes mellitus with peripheral neuropathy (HCC)   S/P kidney transplant   Chronic pain   Nonspecific chest pain   Chest pain    Signed, Rosaria Ferries , PA-C 8:14 AM 04/22/2018 Pager: [phone] See progress notes. Suanne Marker Barrett See progress note later in day. Kirk Ruths, MD

## 2018-04-22 NOTE — Progress Notes (Signed)
ANTICOAGULATION CONSULT NOTE - Follow Up Consult  Pharmacy Consult for Heparin/coumadin Indication: Afib and ACS  Allergies  Allergen Reactions  . Tetracycline Hives  . Hydromorphone Hcl Nausea And Vomiting  . Niacin Other (See Comments)    Mouth blisters  . Niaspan [Niacin Er] Other (See Comments)    Mouth blisters  . Sulfa Antibiotics Nausea Only and Other (See Comments)    "Tears up stomach"  . Sulfonamide Derivatives Other (See Comments)    Reaction: per patient "tears her stomach up"  . Codeine Nausea And Vomiting  . Erythromycin Nausea And Vomiting  . Hydromorphone Hcl Nausea And Vomiting  . Morphine And Related Nausea And Vomiting  . Nalbuphine Nausea And Vomiting  . Sulfasalazine Nausea Only and Other (See Comments)    per patient "tears her stomach up", "Tears up stomach"  . Tape Rash and Other (See Comments)    No "plastic" tape," please    Patient Measurements: Height: 5\' 1"  (154.9 cm) Weight: 230 lb 2.6 oz (104.4 kg) IBW/kg (Calculated) : 47.8 Heparin Dosing Weight:  73.4 kg  Vital Signs: Temp: 97.6 F (36.4 C) (09/06 1221) Temp Source: Oral (09/06 1221) BP: 154/52 (09/06 1221) Pulse Rate: 70 (09/06 1221)  Labs: Recent Labs    04/19/18 1844  04/20/18 0352  04/20/18 0740 04/20/18 1536 04/21/18 0307 04/22/18 0228 04/22/18 0309 04/22/18 1148 04/22/18 1205  HGB 11.2*  --   --   --   --   --  12.0  --  11.9*  --   --   HCT 35.7*  --   --   --   --   --  37.9  --  37.7  --   --   PLT 223  --   --   --   --   --  199  --  192  --   --   LABPROT  --   --   --   --   --   --   --   --   --   --  13.4  INR  --   --   --   --   --   --   --   --   --   --  1.03  HEPARINUNFRC  --   --   --    < > 0.77* 0.55 0.58  --   --  0.31  --   CREATININE 0.92  --  0.87  --   --   --  0.89 0.92  --   --   --   TROPONINI  --    < > <0.03  --  0.03* <0.03  --   --   --   --   --    < > = values in this interval not displayed.    Estimated Creatinine Clearance: 72.3  mL/min (by C-G formula based on SCr of 0.92 mg/dL).   Assessment: 62 yo female with afib and on heparin (CHADSVASC=6). Adding coumadin (unable to use a DOAC with carbamazepine). -heparin level = 0.31, INR= 1.03, CBC stable  Goal of Therapy:  Heparin level 0.3-0.7 units/ml Monitor platelets by anticoagulation protocol: Yes   Plan:  -No heparin changes -Coumadin 10mg  po today (increased dose due to carbamazepine) -Daily PT/INR, heparin level and CBC  Thanks for allowing pharmacy to be a part of this patient's care.  Hildred Laser, PharmD Clinical Pharmacist Please check Amion for pharmacy contact number

## 2018-04-22 NOTE — Progress Notes (Signed)
RAD STAT  BAND REMOVAL  LOCATION:    right radial  DEFLATED PER PROTOCOL:    Yes.    TIME BAND OFF / DRESSING APPLIED:    2300   SITE UPON ARRIVAL:    Level 0  SITE AFTER BAND REMOVAL:    Level 0  CIRCULATION SENSATION AND MOVEMENT:    Within Normal Limits   Yes.    COMMENTS:   Pt.tolerated procedure well

## 2018-04-22 NOTE — Progress Notes (Signed)
Progress Note  Patient Name: Katherine [name] Date of Encounter: 04/22/2018  Primary Cardiologist: [name], MD   Subjective   Denies CP or dyspnea  Inpatient Medications    Scheduled Meds: . azaTHIOprine  125 mg Oral QPM  . calcitRIOL  0.25 mcg Oral Q3 days  . carbamazepine  200 mg Oral BID  . clopidogrel  75 mg Oral Daily  . cyclobenzaprine  10 mg Oral BID  . diltiazem  120 mg Oral Daily  . dofetilide  500 mcg Oral BID  . DULoxetine  60 mg Oral q morning - 10a  . famotidine  20 mg Oral Daily  . insulin aspart  0-9 Units Subcutaneous TID WC  . insulin glargine  8 Units Subcutaneous QHS  . LORazepam  1 mg Oral q morning - 10a  . metoprolol tartrate  25 mg Oral BID  . mometasone-formoterol  2 puff Inhalation BID  . montelukast  10 mg Oral QHS  . pantoprazole  40 mg Oral Daily  . predniSONE  5 mg Oral Q breakfast  . rosuvastatin  20 mg Oral q1800  . sertraline  100 mg Oral q morning - 10a  . sodium chloride flush  3 mL Intravenous Q12H  . sodium chloride flush  3 mL Intravenous Q12H   Continuous Infusions: . sodium chloride    . sodium chloride    . sodium chloride 10 mL/hr (04/22/18 0503)  . heparin 950 Units/hr (04/22/18 0505)   PRN Meds: sodium chloride, sodium chloride, sodium chloride, acetaminophen, albuterol, fentaNYL (SUBLIMAZE) injection, guaiFENesin, nitroGLYCERIN, oxyCODONE, promethazine, sodium chloride flush, sodium chloride flush   Vital Signs    Vitals:   04/21/18 2300 04/22/18 0000 04/22/18 0511 04/22/18 0715  BP: 117/76 116/63 117/71 (!) 131/50  Pulse:   73 77  Resp: (!) 34 (!) 29 18 18   Temp:   97.8 F (36.6 C) 98.3 F (36.8 C)  TempSrc:   Oral Oral  SpO2:   100% 96%  Weight:   104.4 kg   Height:        Intake/Output Summary (Last 24 hours) at 04/22/2018 0851 Last data filed at 04/22/2018 0245 Gross per 24 hour  Intake 3139.28 ml  Output 400 ml  Net 2739.28 ml   Filed Weights   04/21/18 0500 04/21/18 2102 04/22/18 0511    Weight: 104.4 kg 104.4 kg 104.4 kg    Telemetry     Sinus with intermittent atrial flutter- Personally Reviewed  Physical Exam   GEN: No acute distress. WD obese  Neck: No JVD, supple Cardiac: RRR Respiratory: CTA, no wheeze GI: Soft, NT/ND MS: no edema Radial cath site with no hematoma; femoral cath site with ecchymosis but no bruit Neuro:  grossly intact   Labs    Chemistry Recent Labs  Lab 04/20/18 0352 04/21/18 0307 04/21/18 1642 04/22/18 0228  NA 139 141  --  137  K 4.6 3.9 4.4 3.7  CL 107 108  --  103  CO2 23 21*  --  24  GLUCOSE 185* 151*  --  240*  BUN 14 13  --  8  CREATININE 0.87 0.89  --  0.92  CALCIUM 8.9 9.1  --  8.6*  GFRNONAA >60 >60  --  >60  GFRAA >60 >60  --  >60  ANIONGAP 9 12  --  10     Hematology Recent Labs  Lab 04/19/18 1844 04/21/18 0307 04/22/18 0309  WBC 5.5 5.1 5.6  RBC 3.61* 3.87 3.82*  HGB 11.2* 12.0 11.9*  HCT 35.7* 37.9 37.7  MCV 98.9 97.9 98.7  MCH 31.0 31.0 31.2  MCHC 31.4 31.7 31.6  RDW 14.6 14.8 15.0  PLT 223 199 192    Cardiac Enzymes Recent Labs  Lab 04/19/18 2121 04/20/18 0352 04/20/18 0740 04/20/18 1536  TROPONINI 0.03* <0.03 0.03* <0.03    Recent Labs  Lab 04/19/18 1846  TROPIPOC 0.02     Patient Profile     62 year old female with past medical history of coronary artery disease status post coronary artery bypass and graft, paroxysmal atrial fibrillation, hypertension, diabetes mellitus, hyperlipidemia, prior renal transplant with normal creatinine for evaluation of paroxysmal atrial fibrillation/flutter and chest pain.   Assessment & Plan    1 paroxysmal atrial fibrillation/flutter-patient continues to have intermittent atrial flutter on telemetry.  She is in sinus this morning.  We will continue with metoprolol and Cardizem. Tikosyn initiated by Dr Rayann Heman.  He has seen this morning and recommended continuing 500 mcg twice daily.  Follow QT interval.  Continue heparin/coumadin. Patient cannot be  on apixaban due to potential interaction with carbamazepine.    2 angina-cath results noted.  Grafts are patent but LIMA not visualized.  Chest pain is likely related to atrial fibrillation with rapid ventricular response.  Plan medical therapy.  Given need for Coumadin I will discontinue aspirin and plavix.    3 coronary artery disease-continue statin.  4 hypertension-continue present medications.  5 history of renal transplant-follow renal function  Patient can be transferred to telemetry from a cardiac standpoint.  For questions or updates, please contact [place] Please consult [email or web address] for contact info under Cardiology/STEMI.      Signed, Kirk Ruths, MD  04/22/2018, 8:51 AM

## 2018-04-22 NOTE — Progress Notes (Signed)
QTc post Tikosyn this morning reviewed. QTc long. Will hold dose tonight and resume at 246mcg tomorrow morning.  Would anticipate patient be in hospital until Monday.  Chanetta Marshall, NP 04/22/2018 11:15 AM

## 2018-04-23 LAB — BASIC METABOLIC PANEL
ANION GAP: 9 (ref 5–15)
BUN: 9 mg/dL (ref 6–20)
CO2: 23 mmol/L (ref 22–32)
Calcium: 8.9 mg/dL (ref 8.9–10.3)
Chloride: 108 mmol/L (ref 98–111)
Creatinine, Ser: 1.05 mg/dL — ABNORMAL HIGH (ref 0.44–1.00)
GFR, EST NON AFRICAN AMERICAN: 57 mL/min — AB (ref >60)
Glucose, Bld: 169 mg/dL — ABNORMAL HIGH (ref 70–99)
Potassium: 4.2 mmol/L (ref 3.5–5.1)
Sodium: 140 mmol/L (ref 135–145)

## 2018-04-23 LAB — GLUCOSE, CAPILLARY
GLUCOSE-CAPILLARY: 315 mg/dL — AB (ref 70–99)
GLUCOSE-CAPILLARY: 334 mg/dL — AB (ref 70–99)
GLUCOSE-CAPILLARY: 211 mg/dL — AB (ref 70–99)
Glucose-Capillary: 143 mg/dL — ABNORMAL HIGH (ref 70–99)

## 2018-04-23 LAB — CBC
HEMATOCRIT: 30.6 % — AB (ref 36.0–46.0)
HEMOGLOBIN: 10 g/dL — AB (ref 12.0–15.0)
MCH: 31.6 pg (ref 26.0–34.0)
MCHC: 32.7 g/dL (ref 30.0–36.0)
MCV: 96.8 fL (ref 78.0–100.0)
Platelets: 155 10*3/uL (ref 150–400)
RBC: 3.16 MIL/uL — ABNORMAL LOW (ref 3.87–5.11)
RDW: 14.7 % (ref 11.5–15.5)
WBC: 4 10*3/uL (ref 4.0–10.5)

## 2018-04-23 LAB — HEPARIN LEVEL (UNFRACTIONATED): HEPARIN UNFRACTIONATED: 0.45 [IU]/mL (ref 0.30–0.70)

## 2018-04-23 LAB — PROTIME-INR
INR: 1.07
PROTHROMBIN TIME: 13.8 s (ref 11.4–15.2)

## 2018-04-23 LAB — MAGNESIUM: Magnesium: 1.9 mg/dL (ref 1.7–2.4)

## 2018-04-23 MED ORDER — OFF THE BEAT BOOK
Freq: Once | Status: AC
  Administered 2018-04-23: 13:00:00
  Filled 2018-04-23: qty 1

## 2018-04-23 MED ORDER — GUAIFENESIN-DM 100-10 MG/5ML PO SYRP: 5.0000 mL | ORAL_SOLUTION | ORAL | Status: DC | PRN

## 2018-04-23 MED ORDER — WARFARIN SODIUM 10 MG PO TABS
10.0000 mg | ORAL_TABLET | Freq: Once | ORAL | Status: AC
  Administered 2018-04-23: 10 mg via ORAL
  Filled 2018-04-23: qty 1

## 2018-04-23 MED ORDER — COUMADIN BOOK
Freq: Once | Status: AC
  Administered 2018-04-23: 13:00:00
  Filled 2018-04-23: qty 1

## 2018-04-23 MED ORDER — LEVALBUTEROL HCL 0.63 MG/3ML IN NEBU: 0.6300 mg | INHALATION_SOLUTION | Freq: Four times a day (QID) | RESPIRATORY_TRACT | Status: DC | PRN

## 2018-04-23 MED ORDER — PATIENT'S GUIDE TO USING COUMADIN BOOK
Freq: Once | Status: DC
  Filled 2018-04-23: qty 1

## 2018-04-23 NOTE — Progress Notes (Signed)
[facility] PROGRESS NOTE  Katherine [name] PYK:[id number] DOB: [birth year] DOA: 04/19/2018  PCP: [name], MD  Brief History/Interval Summary: 62 y.o. female with medical history significant for coronary artery disease status post CABG and subsequent stents, depression with anxiety, chronic pain, renal transplant recipient, paroxysmal atrial fibrillation not on anticoagulation, and insulin-dependent diabetes mellitus, presented to the emergency department with chest pain.  Patient reports that she was in her usual state of health and having an uneventful day when she developed sudden onset of rapid palpitations while at rest.  This was followed by discomfort across the anterior chest, radiating up into the bilateral jaw, and associated with nausea and diaphoresis.  Patient was seen in the emergency department.  She was given a dose of intravenous Cardizem with improvement in heart rate.  She was hospitalized for further management. Patient seen by cardiology and electrophysiology.  Started on Tikosyn load.  She also underwent cardiac catheterization.  Reason for Visit: Paroxysmal atrial fibrillation.  Chest pain.  Consultants: Cardiology  Procedures:  Cardiac catheterization 9/5 Conclusion     LV end diastolic pressure is normal.  _________________________________________________  Colon [place] LAD to Prox LAD lesion is 50% stenosed. Prox LAD-1 lesion is 100% stenosed prior to D1. Prox LAD-2 lesion is 100% stenosed after D1.  SVG-D1 graft was visualized by angiography and is large. Dist Graft to Insertion STENT is 20% stenosed.  The Native Cx is widely patent with a small 1st Marg & 2 Small distal LPL branches.  Ost 1st Mrg lesion is 90% stenosed. Small Caliber Vessel - not good PCI/PTCA target  Ost RCA to Dist RCA lesion is 100% stenosed. - Unable to engage. (known occlusion)  SVG-dRCA graft was visualized by angiography and is large. The graft exhibits minimal luminal  irregularities.  LIMA graft was not injected. 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   Severe native coronary disease with occluded RCA and proximal LAD.  Severe, but stable ostial OM1 disease Patent SVG-D1 with stent in place. Patent SVG-dRCA Due to access issues, unable to visualize LIMA graft. Severe focal calcified lesion in the right common femoral artery: Unable to obtain femoral arterial access despite using ultrasound guidance -unable to advance wire.    Antibiotics: None  Subjective/Interval History: Patient states that she is feeling well.  Denies any chest pain or shortness of breath.  Had questions regarding her atrial fibrillation which were answered.  ROS: Denies any headaches.  Objective:  Vital Signs  Vitals:   04/22/18 2005 04/23/18 0131 04/23/18 0701 04/23/18 0932  BP: (!) 139/51 (!) 183/66 (!) 133/48   Pulse: 77 80 75   Resp: 18 20 20    Temp: 98.5 F (36.9 C) 97.6 F (36.4 C) 98.4 F (36.9 C)   TempSrc: Oral Oral Oral   SpO2: 96% 97% 96% 95%  Weight:  105 kg    Height:        Intake/Output Summary (Last 24 hours) at 04/23/2018 1147 Last data filed at 04/23/2018 0845 Gross per 24 hour  Intake 834 ml  Output 1900 ml  Net -1066 ml   Filed Weights   04/21/18 2102 04/22/18 0511 04/23/18 0131  Weight: 104.4 kg 104.4 kg 105 kg    General appearance: Awake alert.  In no distress  Resp: Normal effort at rest.  Decreased air entry at the bases but improved compared to yesterday.  No wheezing rales or rhonchi. Cardio: S1-S2 is normal regular.   GI: Abdomen remains soft.  Nontender nondistended  extremities: No significant edema Neurologic: Alert and oriented x3.  No focal  neurological deficits.  Lab Results:  Data Reviewed: I have personally reviewed following labs and imaging studies  CBC: Recent Labs  Lab 04/19/18 1844 04/21/18 0307 04/22/18 0309 04/23/18 0418  WBC 5.5 5.1 5.6 4.0  HGB 11.2* 12.0 11.9* 10.0*  HCT 35.7* 37.9 37.7 30.6*  MCV 98.9 97.9 98.7 96.8  PLT 223 199 192 992    Basic Metabolic Panel: Recent Labs  Lab 04/19/18 1844 04/20/18 0352 04/21/18 0307 04/21/18 1642 04/22/18 0228 04/23/18 0418  NA 139 139 141  --  137 140  K 4.0 4.6 3.9 4.4 3.7 4.2  CL 106 107 108  --  103 108  CO2 22 23 21*  --  24 23  GLUCOSE 218* 185* 151*  --  240* 169*  BUN 16 14 13   --  8 9  CREATININE 0.92 0.87 0.89  --  0.92 1.05*  CALCIUM 9.0 8.9 9.1  --  8.6* 8.9  MG  --  1.9  --  1.7 2.3 1.9    GFR: Estimated Creatinine Clearance: 63.6 mL/min (A) (by C-G formula based on SCr of 1.05 mg/dL (H)).  Cardiac Enzymes: Recent Labs  Lab 04/19/18 2121 04/20/18 0352 04/20/18 0740 04/20/18 1536  TROPONINI 0.03* <0.03 0.03* <0.03    CBG: Recent Labs  Lab 04/22/18 0619 04/22/18 1303 04/22/18 1704 04/22/18 2206 04/23/18 0625  GLUCAP 115* 205* 163* 162* 143*     Radiology Studies: No results found.   Medications:  Scheduled: . azaTHIOprine  125 mg Oral QPM  . calcitRIOL  0.25 mcg Oral Q3 days  . carbamazepine  200 mg Oral BID  . coumadin book   Does not apply Once  . cyclobenzaprine  10 mg Oral BID  . diltiazem  120 mg Oral Daily  . dofetilide  250 mcg Oral BID  . DULoxetine  60 mg Oral q morning - 10a  . famotidine  20 mg Oral Daily  . insulin aspart  0-9 Units Subcutaneous TID WC  . insulin glargine  8 Units Subcutaneous QHS  . LORazepam  1 mg Oral q morning - 10a  . metoprolol tartrate  25 mg Oral BID  . mometasone-formoterol  2 puff Inhalation BID  . montelukast  10 mg Oral QHS  . off the beat book   Does not apply Once  . pantoprazole  40 mg Oral Daily  . patient's guide to using coumadin book   Does not apply Once    . predniSONE  5 mg Oral Q breakfast  . rosuvastatin  20 mg Oral q1800  . sertraline  100 mg Oral q morning - 10a  . sodium chloride flush  3 mL Intravenous Q12H  . sodium chloride flush  3 mL Intravenous Q12H  . warfarin  10 mg Oral ONCE-1800  . Warfarin - Pharmacist Dosing Inpatient   Does not apply q1800   Continuous: . sodium chloride    . sodium chloride 10 mL/hr (04/22/18 0503)  . heparin 950 Units/hr (04/22/18 1900)   EQA:STMHDQ chloride, sodium chloride, acetaminophen, fentaNYL (SUBLIMAZE) injection, guaiFENesin, guaiFENesin-dextromethorphan, levalbuterol, nitroGLYCERIN, oxyCODONE, promethazine, sodium chloride flush, sodium chloride flush  Assessment/Plan:    Chest pain in the setting of known coronary artery  disease Chest pain could have been due to tachyarrhythmia.  No significant elevation in troponin level was noted.  Patient underwent cardiac catheterization.  No intervention was performed.  Patient remains chest pain-free.  Low-dose aspirin was recommended by interventional cardiologist.  However patient was on Plavix.  It appears that both of these agents have been stopped as patient needs to be on warfarin.  Paroxysmal atrial fibrillation She was in atrial fibrillation with RVR at present patient had heart rate of 120s.  She was given diltiazem x1.  She did convert to sinus rhythm.  She converted back to atrial fibrillation.  Electrophysiology has also been following.  Patient started on Tikosyn.  Defer further management to cardiology.  QT interval and electrolytes being monitored closely.  Anticoagulation has been initiated warfarin.  Patient is also on IV heparin for now.  On oral Cardizem for rate control.  Echocardiogram last done in March which showed normal systolic function, grade 2 diastolic dysfunction and mild aortic stenosis.  Insulin-dependent diabetes mellitus HbA1c was 7.1 in June.  CBGs are reasonably well controlled.  Continue Lantus and  NovoLog.  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  She had a wound VAC in place which was removed on 9/5.  She will need to follow-up with her hand surgeon next week.    Chronic pain Continue home regimen with Cymbalta Flexeril and oxycodone as needed.  History of depression with anxiety Continue home medications.  Renal transplant recipient Creatinine noted to be slightly higher today compared to yesterday.  Recheck labs tomorrow.  Continue prednisone and Imuran.  DVT Prophylaxis: Currently on IV heparin.  Started on warfarin as well.    Code Status: Full code Family Communication: Discussed with the patient Disposition Plan: Management as outlined above.  Discharge when cleared by cardiology.    LOS: 2 days   [place] [facility] Pager [phone] 04/23/2018, 11:47 AM  If 7PM-7AM, please contact night-coverage at [email or web address], password [facility]

## 2018-04-23 NOTE — Progress Notes (Signed)
ANTICOAGULATION CONSULT NOTE - Follow Up Consult  Pharmacy Consult for Heparin/coumadin Indication: Afib and ACS  Allergies  Allergen Reactions  . Tetracycline Hives  . Hydromorphone Hcl Nausea And Vomiting  . Niacin Other (See Comments)    Mouth blisters  . Niaspan [Niacin Er] Other (See Comments)    Mouth blisters  . Sulfa Antibiotics Nausea Only and Other (See Comments)    "Tears up stomach"  . Sulfonamide Derivatives Other (See Comments)    Reaction: per patient "tears her stomach up"  . Codeine Nausea And Vomiting  . Erythromycin Nausea And Vomiting  . Hydromorphone Hcl Nausea And Vomiting  . Morphine And Related Nausea And Vomiting  . Nalbuphine Nausea And Vomiting  . Sulfasalazine Nausea Only and Other (See Comments)    per patient "tears her stomach up", "Tears up stomach"  . Tape Rash and Other (See Comments)    No "plastic" tape," please    Patient Measurements: Height: 5\' 1"  (154.9 cm) Weight: 231 lb 7.7 oz (105 kg) IBW/kg (Calculated) : 47.8 Heparin Dosing Weight:  73.4 kg  Vital Signs: Temp: 98.4 F (36.9 C) (09/07 0701) Temp Source: Oral (09/07 0701) BP: 133/48 (09/07 0701) Pulse Rate: 75 (09/07 0701)  Labs: Recent Labs    04/20/18 1536  04/21/18 0307 04/22/18 0228 04/22/18 0309 04/22/18 1148 04/22/18 1205 04/23/18 0418 04/23/18 0941  HGB  --    < > 12.0  --  11.9*  --   --  10.0*  --   HCT  --   --  37.9  --  37.7  --   --  30.6*  --   PLT  --   --  199  --  192  --   --  155  --   LABPROT  --   --   --   --   --   --  13.4 13.8  --   INR  --   --   --   --   --   --  1.03 1.07  --   HEPARINUNFRC 0.55  --  0.58  --   --  0.31  --   --  0.45  CREATININE  --   --  0.89 0.92  --   --   --  1.05*  --   TROPONINI <0.03  --   --   --   --   --   --   --   --    < > = values in this interval not displayed.    Estimated Creatinine Clearance: 63.6 mL/min (A) (by C-G formula based on SCr of 1.05 mg/dL (H)).   Assessment: 62 yo female with afib  and on heparin (CHADSVASC=6). Adding coumadin (unable to use a DOAC with carbamazepine). -heparin level = 0.45, INR= 1.07, Hgb down to 10, PLTc wnl.  Goal of Therapy:  Heparin level 0.3-0.7 units/ml Monitor platelets by anticoagulation protocol: Yes    Plan:  -Continue heparin at 950 units/hr -Coumadin 10mg  PO x 1 tonight (increased dose due to carbamazepine) -Daily PT/INR, heparin level and CBC  Thanks for allowing pharmacy to be a part of this patient's care.  Leron Croak, PharmD PGY1 Pharmacy Resident Please check Amion for pharmacy contact number

## 2018-04-24 LAB — CBC
HEMATOCRIT: 31 % — AB (ref 36.0–46.0)
HEMOGLOBIN: 10 g/dL — AB (ref 12.0–15.0)
MCH: 31.3 pg (ref 26.0–34.0)
MCHC: 32.3 g/dL (ref 30.0–36.0)
MCV: 96.9 fL (ref 78.0–100.0)
PLATELETS: 168 10*3/uL (ref 150–400)
RBC: 3.2 MIL/uL — ABNORMAL LOW (ref 3.87–5.11)
RDW: 15 % (ref 11.5–15.5)
WBC: 3.9 10*3/uL — ABNORMAL LOW (ref 4.0–10.5)

## 2018-04-24 LAB — BASIC METABOLIC PANEL
Anion gap: 11 (ref 5–15)
BUN: 13 mg/dL (ref 6–20)
CHLORIDE: 106 mmol/L (ref 98–111)
CO2: 22 mmol/L (ref 22–32)
Calcium: 9 mg/dL (ref 8.9–10.3)
Creatinine, Ser: 0.9 mg/dL (ref 0.44–1.00)
GFR calc Af Amer: >60 mL/min (ref >60)
GFR calc non Af Amer: >60 mL/min (ref >60)
GLUCOSE: 203 mg/dL — AB (ref 70–99)
POTASSIUM: 4.2 mmol/L (ref 3.5–5.1)
Sodium: 139 mmol/L (ref 135–145)

## 2018-04-24 LAB — MAGNESIUM: Magnesium: 1.8 mg/dL (ref 1.7–2.4)

## 2018-04-24 LAB — PROTIME-INR
INR: 1.39
Prothrombin Time: 16.9 seconds — ABNORMAL HIGH (ref 11.4–15.2)

## 2018-04-24 LAB — GLUCOSE, CAPILLARY
GLUCOSE-CAPILLARY: 177 mg/dL — AB (ref 70–99)
GLUCOSE-CAPILLARY: 252 mg/dL — AB (ref 70–99)
GLUCOSE-CAPILLARY: 115 mg/dL — AB (ref 70–99)
Glucose-Capillary: 227 mg/dL — ABNORMAL HIGH (ref 70–99)

## 2018-04-24 LAB — HEPARIN LEVEL (UNFRACTIONATED): Heparin Unfractionated: 0.45 IU/mL (ref 0.30–0.70)

## 2018-04-24 MED ORDER — FLUTICASONE PROPIONATE 50 MCG/ACT NA SUSP
2.0000 | Freq: Every day | NASAL | Status: DC
  Administered 2018-04-24 – 2018-04-26 (×3): 2 via NASAL
  Filled 2018-04-24: qty 16

## 2018-04-24 MED ORDER — MAGNESIUM SULFATE 2 GM/50ML IV SOLN
2.0000 g | Freq: Once | INTRAVENOUS | Status: AC
  Administered 2018-04-24: 2 g via INTRAVENOUS
  Filled 2018-04-24: qty 50

## 2018-04-24 MED ORDER — WARFARIN SODIUM 10 MG PO TABS
10.0000 mg | ORAL_TABLET | Freq: Once | ORAL | Status: AC
  Administered 2018-04-24: 10 mg via ORAL
  Filled 2018-04-24: qty 1

## 2018-04-24 MED ORDER — PROMETHAZINE HCL 25 MG/ML IJ SOLN
12.5000 mg | Freq: Four times a day (QID) | INTRAMUSCULAR | Status: DC | PRN
  Administered 2018-04-24 – 2018-04-25 (×3): 12.5 mg via INTRAVENOUS
  Filled 2018-04-24 (×4): qty 1

## 2018-04-24 NOTE — Progress Notes (Signed)
Progress Note  Patient Name: Katherine [name] Date of Encounter: 04/23/18  Primary Cardiologist: [name], MD   Subjective   No chest pain or sob or palpitatins  Inpatient Medications    Scheduled Meds: . azaTHIOprine  125 mg Oral QPM  . calcitRIOL  0.25 mcg Oral Q3 days  . carbamazepine  200 mg Oral BID  . cyclobenzaprine  10 mg Oral BID  . diltiazem  120 mg Oral Daily  . dofetilide  250 mcg Oral BID  . DULoxetine  60 mg Oral q morning - 10a  . famotidine  20 mg Oral Daily  . insulin aspart  0-9 Units Subcutaneous TID WC  . insulin glargine  8 Units Subcutaneous QHS  . LORazepam  1 mg Oral q morning - 10a  . metoprolol tartrate  25 mg Oral BID  . mometasone-formoterol  2 puff Inhalation BID  . montelukast  10 mg Oral QHS  . pantoprazole  40 mg Oral Daily  . patient's guide to using coumadin book   Does not apply Once  . predniSONE  5 mg Oral Q breakfast  . rosuvastatin  20 mg Oral q1800  . sertraline  100 mg Oral q morning - 10a  . sodium chloride flush  3 mL Intravenous Q12H  . sodium chloride flush  3 mL Intravenous Q12H  . Warfarin - Pharmacist Dosing Inpatient   Does not apply q1800   Continuous Infusions: . sodium chloride    . sodium chloride 10 mL/hr (04/22/18 0503)  . heparin 950 Units/hr (04/22/18 1900)   PRN Meds: sodium chloride, sodium chloride, acetaminophen, fentaNYL (SUBLIMAZE) injection, guaiFENesin, guaiFENesin-dextromethorphan, levalbuterol, nitroGLYCERIN, oxyCODONE, promethazine, sodium chloride flush, sodium chloride flush   Vital Signs    Vitals:   04/23/18 1848 04/23/18 2056 04/24/18 0003 04/24/18 0419  BP: (!) 181/81 (!) 155/77 (!) 112/54 117/77  Pulse: 95 69 66 69  Resp:  20 18 20   Temp:  98.5 F (36.9 C) 98.4 F (36.9 C) 98.4 F (36.9 C)  TempSrc:  Oral Oral Oral  SpO2:  100% 97% 100%  Weight:    105.7 kg  Height:        Intake/Output Summary (Last 24 hours) at 04/24/2018 0709 Last data filed at 04/24/2018 0600 Gross per  24 hour  Intake 1919.23 ml  Output 3 ml  Net 1916.23 ml   Filed Weights   04/22/18 0511 04/23/18 0131 04/24/18 0419  Weight: 104.4 kg 105 kg 105.7 kg    Telemetry    nsr - Personally Reviewed  ECG    Nsr, QTC 470 - Personally Reviewed  Physical Exam   GEN: No acute distress.   Neck: No JVD Cardiac: RRR, no murmurs, rubs, or gallops.  Respiratory: Clear to auscultation bilaterally. GI: Soft, obese, nontender, non-distended  MS: No edema; No deformity. Neuro:  Nonfocal  Psych: Normal affect   Labs    Chemistry Recent Labs  Lab 04/22/18 0228 04/23/18 0418 04/24/18 0454  NA 137 140 139  K 3.7 4.2 4.2  CL 103 108 106  CO2 24 23 22   GLUCOSE 240* 169* 203*  BUN 8 9 13   CREATININE 0.92 1.05* 0.90  CALCIUM 8.6* 8.9 9.0  GFRNONAA >60 57* >60  GFRAA >60 >60 >60  ANIONGAP 10 9 11      Hematology Recent Labs  Lab 04/22/18 0309 04/23/18 0418 04/24/18 0454  WBC 5.6 4.0 3.9*  RBC 3.82* 3.16* 3.20*  HGB 11.9* 10.0* 10.0*  HCT 37.7 30.6* 31.0*  MCV 98.7 96.8 96.9  MCH 31.2 31.6 31.3  MCHC 31.6 32.7 32.3  RDW 15.0 14.7 15.0  PLT 192 155 168    Cardiac Enzymes Recent Labs  Lab 04/19/18 2121 04/20/18 0352 04/20/18 0740 04/20/18 1536  TROPONINI 0.03* <0.03 0.03* <0.03    Recent Labs  Lab 04/19/18 1846  TROPIPOC 0.02     BNPNo results for input(s): BNP, PROBNP in the last 168 hours.   DDimer No results for input(s): DDIMER in the last 168 hours.   Radiology    No results found.  Cardiac Studies   none  Patient Profile     62 y.o. female admitted for atrial fib and started on dofetilide. QT initially too long, dose adjusted  Assessment & Plan    1. Persistent atrial fib - she is back in NSR. Her dofetilide has increased her QT and we have adjusted dose. We will follow QT at least another 24-36 hours before DC.     For questions or updates, please contact [place] Please consult [email or web address] for contact info under  Cardiology/STEMI.      Mikle Bosworth.D.

## 2018-04-24 NOTE — Progress Notes (Signed)
Progress Note  Patient Name: Katherine [name] Date of Encounter: 04/24/2018  Primary Cardiologist: [name], MD   Subjective   No chest pain or sob. C/o HA.  Inpatient Medications    Scheduled Meds: . azaTHIOprine  125 mg Oral QPM  . calcitRIOL  0.25 mcg Oral Q3 days  . carbamazepine  200 mg Oral BID  . cyclobenzaprine  10 mg Oral BID  . diltiazem  120 mg Oral Daily  . dofetilide  250 mcg Oral BID  . DULoxetine  60 mg Oral q morning - 10a  . famotidine  20 mg Oral Daily  . fluticasone  2 spray Each Nare Daily  . insulin aspart  0-9 Units Subcutaneous TID WC  . insulin glargine  8 Units Subcutaneous QHS  . LORazepam  1 mg Oral q morning - 10a  . metoprolol tartrate  25 mg Oral BID  . mometasone-formoterol  2 puff Inhalation BID  . montelukast  10 mg Oral QHS  . pantoprazole  40 mg Oral Daily  . patient's guide to using coumadin book   Does not apply Once  . predniSONE  5 mg Oral Q breakfast  . rosuvastatin  20 mg Oral q1800  . sertraline  100 mg Oral q morning - 10a  . sodium chloride flush  3 mL Intravenous Q12H  . sodium chloride flush  3 mL Intravenous Q12H  . Warfarin - Pharmacist Dosing Inpatient   Does not apply q1800   Continuous Infusions: . sodium chloride    . sodium chloride 10 mL/hr (04/22/18 0503)  . heparin 950 Units/hr (04/22/18 1900)  . magnesium sulfate 1 - 4 g bolus IVPB     PRN Meds: sodium chloride, sodium chloride, acetaminophen, fentaNYL (SUBLIMAZE) injection, guaiFENesin, guaiFENesin-dextromethorphan, levalbuterol, nitroGLYCERIN, oxyCODONE, promethazine, sodium chloride flush, sodium chloride flush   Vital Signs    Vitals:   04/23/18 2056 04/24/18 0003 04/24/18 0419 04/24/18 0831  BP: (!) 155/77 (!) 112/54 117/77 (!) 150/81  Pulse: 69 66 69 72  Resp: 20 18 20    Temp: 98.5 F (36.9 C) 98.4 F (36.9 C) 98.4 F (36.9 C) 98.3 F (36.8 C)  TempSrc: Oral Oral Oral Oral  SpO2: 100% 97% 100% 98%  Weight:   105.7 kg   Height:         Intake/Output Summary (Last 24 hours) at 04/24/2018 1105 Last data filed at 04/24/2018 0600 Gross per 24 hour  Intake 1679.23 ml  Output 3 ml  Net 1676.23 ml   Filed Weights   04/22/18 0511 04/23/18 0131 04/24/18 0419  Weight: 104.4 kg 105 kg 105.7 kg    Telemetry    nsr - Personally Reviewed  ECG    nsr with QTC around 480 - Personally Reviewed  Physical Exam   GEN: obese, No acute distress.   Neck: 6 cm JVD Cardiac: RRR, no murmurs, rubs, or gallops.  Respiratory: Clear to auscultation bilaterally. GI: Soft, nontender, non-distended  MS: No edema; No deformity. Neuro:  Nonfocal  Psych: Normal affect   Labs    Chemistry Recent Labs  Lab 04/22/18 0228 04/23/18 0418 04/24/18 0454  NA 137 140 139  K 3.7 4.2 4.2  CL 103 108 106  CO2 24 23 22   GLUCOSE 240* 169* 203*  BUN 8 9 13   CREATININE 0.92 1.05* 0.90  CALCIUM 8.6* 8.9 9.0  GFRNONAA >60 57* >60  GFRAA >60 >60 >60  ANIONGAP 10 9 11      Hematology Recent Labs  Lab  04/22/18 0309 04/23/18 0418 04/24/18 0454  WBC 5.6 4.0 3.9*  RBC 3.82* 3.16* 3.20*  HGB 11.9* 10.0* 10.0*  HCT 37.7 30.6* 31.0*  MCV 98.7 96.8 96.9  MCH 31.2 31.6 31.3  MCHC 31.6 32.7 32.3  RDW 15.0 14.7 15.0  PLT 192 155 168    Cardiac Enzymes Recent Labs  Lab 04/19/18 2121 04/20/18 0352 04/20/18 0740 04/20/18 1536  TROPONINI 0.03* <0.03 0.03* <0.03    Recent Labs  Lab 04/19/18 1846  TROPIPOC 0.02     BNPNo results for input(s): BNP, PROBNP in the last 168 hours.   DDimer No results for input(s): DDIMER in the last 168 hours.   Radiology    No results found.  Cardiac Studies   none  Patient Profile     62 y.o. female admitted with chest pain and afib with a rVR  Assessment & Plan    1. Persistent atrial fib - she is maintaining nsr on dofetilide. She will continue her medical therapy. She should be stable for DC home tomorrow if her QT is ok. Continue current dose of dofetilide. QT is ok today. 2.  Anti-coag - her INR is subtherapeutic. Hopefully she will go out by tomorrow. Continue IV heparin for now.      For questions or updates, please contact [place] Please consult [email or web address] for contact info under Cardiology/STEMI.      Signed, Cristopher Peru, MD  04/24/2018, 11:05 AM  Patient ID: Katherine [name], female   DOB: 12-19-1955, 62 y.o.   MRN: [id number]

## 2018-04-24 NOTE — Progress Notes (Signed)
[facility] PROGRESS NOTE  Katherine [name] OZH:[id number] DOB: 07/22/1956 DOA: 04/19/2018  PCP: [name], MD  Brief History/Interval Summary: 62 y.o. female with medical history significant for coronary artery disease status post CABG and subsequent stents, depression with anxiety, chronic pain, renal transplant recipient, paroxysmal atrial fibrillation not on anticoagulation, and insulin-dependent diabetes mellitus, presented to the emergency department with chest pain.  Patient reports that she was in her usual state of health and having an uneventful day when she developed sudden onset of rapid palpitations while at rest.  This was followed by discomfort across the anterior chest, radiating up into the bilateral jaw, and associated with nausea and diaphoresis.  Patient was seen in the emergency department.  She was given a dose of intravenous Cardizem with improvement in heart rate.  She was hospitalized for further management. Patient seen by cardiology and electrophysiology.  Started on Tikosyn load.  She also underwent cardiac catheterization.  Reason for Visit: Paroxysmal atrial fibrillation.  Chest pain.  Consultants: Cardiology  Procedures:  Cardiac catheterization 9/5 Conclusion     LV end diastolic pressure is normal.  _________________________________________________  Colon Flattery LAD to Prox LAD lesion is 50% stenosed. Prox LAD-1 lesion is 100% stenosed prior to D1. Prox LAD-2 lesion is 100% stenosed after D1.  SVG-D1 graft was visualized by angiography and is large. Dist Graft to Insertion STENT is 20% stenosed.  The Native Cx is widely patent with a small 1st Marg & 2 Small distal LPL branches.  Ost 1st Mrg lesion is 90% stenosed. Small Caliber Vessel - not good PCI/PTCA target  Ost RCA to Dist RCA lesion is 100% stenosed. - Unable to engage. (known occlusion)  SVG-dRCA graft was visualized by angiography and is large. The graft exhibits minimal luminal  irregularities.  LIMA graft was not injected. 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   Severe native coronary disease with occluded RCA and proximal LAD.  Severe, but stable ostial OM1 disease Patent SVG-D1 with stent in place. Patent SVG-dRCA Due to access issues, unable to visualize LIMA graft. Severe focal calcified lesion in the right common femoral artery: Unable to obtain femoral arterial access despite using ultrasound guidance -unable to advance wire.    Antibiotics: None  Subjective/Interval History: Patient states that she is feeling well.  Very anxious about her various diagnoses.  Denies any chest pain or shortness of breath.    ROS: Complains of sinus headache.  Requesting her Flonase.  Objective:  Vital Signs  Vitals:   04/23/18 2056 04/24/18 0003 04/24/18 0419 04/24/18 0831  BP: (!) 155/77 (!) 112/54 117/77 (!) 150/81  Pulse: 69 66 69 72  Resp: 20 18 20    Temp: 98.5 F (36.9 C) 98.4 F (36.9 C) 98.4 F (36.9 C) 98.3 F (36.8 C)  TempSrc: Oral Oral Oral Oral  SpO2: 100% 97% 100% 98%  Weight:   105.7 kg   Height:        Intake/Output Summary (Last 24 hours) at 04/24/2018 0942 Last data filed at 04/24/2018 0600 Gross per 24 hour  Intake 1679.23 ml  Output 3 ml  Net 1676.23 ml   Filed Weights   04/22/18 0511 04/23/18 0131 04/24/18 0419  Weight: 104.4 kg 105 kg 105.7 kg    General  appearance: Awake alert.  In no distress Resp: Normal effort at rest.  Clear to auscultation bilaterally.  No wheezing rales rhonchi. Cardio: S1-S2 remains normal regular.  Telemetry shows sinus rhythm with few PVCs. GI: Abdomen remains soft.  Nontender nondistended. Extremities: No  edema Neurologic: No obvious focal neurological deficits.  Lab Results:  Data Reviewed: I have personally reviewed following labs and imaging studies  CBC: Recent Labs  Lab 04/19/18 1844 04/21/18 0307 04/22/18 0309 04/23/18 0418 04/24/18 0454  WBC 5.5 5.1 5.6 4.0 3.9*  HGB 11.2* 12.0 11.9* 10.0* 10.0*  HCT 35.7* 37.9 37.7 30.6* 31.0*  MCV 98.9 97.9 98.7 96.8 96.9  PLT 223 199 192 155 532    Basic Metabolic Panel: Recent Labs  Lab 04/20/18 0352 04/21/18 0307 04/21/18 1642 04/22/18 0228 04/23/18 0418 04/24/18 0454  NA 139 141  --  137 140 139  K 4.6 3.9 4.4 3.7 4.2 4.2  CL 107 108  --  103 108 106  CO2 23 21*  --  24 23 22   GLUCOSE 185* 151*  --  240* 169* 203*  BUN 14 13  --  8 9 13   CREATININE 0.87 0.89  --  0.92 1.05* 0.90  CALCIUM 8.9 9.1  --  8.6* 8.9 9.0  MG 1.9  --  1.7 2.3 1.9 1.8    GFR: Estimated Creatinine Clearance: 74.5 mL/min (by C-G formula based on SCr of 0.9 mg/dL).  Cardiac Enzymes: Recent Labs  Lab 04/19/18 2121 04/20/18 0352 04/20/18 0740 04/20/18 1536  TROPONINI 0.03* <0.03 0.03* <0.03    CBG: Recent Labs  Lab 04/23/18 0625 04/23/18 1235 04/23/18 1621 04/23/18 2118 04/24/18 0753  GLUCAP 143* 315* 334* 211* 177*     Radiology Studies: No results found.   Medications:  Scheduled: . azaTHIOprine  125 mg Oral QPM  . calcitRIOL  0.25 mcg Oral Q3 days  . carbamazepine  200 mg Oral BID  . cyclobenzaprine  10 mg Oral BID  . diltiazem  120 mg Oral Daily  . dofetilide  250 mcg Oral BID  . DULoxetine  60 mg Oral q morning - 10a  . famotidine  20 mg Oral Daily  . fluticasone  2 spray Each Nare Daily  . insulin aspart  0-9 Units Subcutaneous TID WC  . insulin glargine  8 Units Subcutaneous QHS  . LORazepam  1 mg Oral q morning - 10a  . metoprolol tartrate  25 mg Oral BID  . mometasone-formoterol  2 puff Inhalation BID  . montelukast  10 mg Oral QHS  . pantoprazole  40 mg Oral Daily  . patient's guide to using coumadin book    Does not apply Once  . predniSONE  5 mg Oral Q breakfast  . rosuvastatin  20 mg Oral q1800  . sertraline  100 mg Oral q morning - 10a  . sodium chloride flush  3 mL Intravenous Q12H  . sodium chloride flush  3 mL Intravenous Q12H  . Warfarin - Pharmacist Dosing Inpatient   Does not apply q1800   Continuous: . sodium chloride    . sodium chloride 10 mL/hr (04/22/18 0503)  . heparin 950 Units/hr (04/22/18 1900)   DJM:EQASTM chloride, sodium chloride, acetaminophen, fentaNYL (SUBLIMAZE) injection, guaiFENesin, guaiFENesin-dextromethorphan, levalbuterol, nitroGLYCERIN, oxyCODONE, promethazine, sodium chloride flush, sodium chloride flush  Assessment/Plan:    Chest pain in the setting of known coronary artery disease Chest pain could have been due to tachyarrhythmia.  No significant elevation in troponin level was noted.  Patient  underwent cardiac catheterization.  No significant disease was noted.  No intervention was performed.  Patient remains chest pain-free.  Instead of antiplatelet agents patient will be on warfarin.  Paroxysmal atrial fibrillation She was in atrial fibrillation with RVR at present patient had heart rate of 120s.  She was given diltiazem x1.  She did convert to sinus rhythm.  She converted back to atrial fibrillation.  Electrophysiology has also been following.  Patient is being loaded with Tikosyn which is being monitored by cardiology.  Anticoagulation with warfarin.  Remains on IV heparin.  Patient also on oral Cardizem.  Echocardiogram last done in March which showed normal systolic function, grade 2 diastolic dysfunction and mild aortic stenosis.  Will need to know from cardiology if she is going to need any bridging therapy at discharge if INR is not therapeutic.  Insulin-dependent diabetes mellitus HbA1c was 7.1 in June.  CBGs were noted to be elevated yesterday afternoon.  177 this morning.  Continue to watch for now.    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  She had a wound VAC in place which was removed on 9/5.  She will need to follow-up with her hand surgeon next week.    Chronic pain Continue home regimen with Cymbalta Flexeril and oxycodone as needed.  History of depression with anxiety Continue home medications.  Renal transplant recipient Creatinine is back to normal today.  Continue her prednisone and azathioprine.    DVT Prophylaxis: On IV heparin and warfarin Code Status: Full code Family Communication: Discussed with the patient Disposition Plan: Management as outlined above.  Discharge when cleared by cardiology.    LOS: 3 days   [facility] Pager [phone] 04/24/2018, 9:42 AM  If 7PM-7AM, please contact night-coverage at [email or web address], password [facility]

## 2018-04-24 NOTE — Progress Notes (Signed)
ANTICOAGULATION CONSULT NOTE - Follow Up Consult  Pharmacy Consult for Heparin/coumadin Indication: Afib and ACS  Allergies  Allergen Reactions  . Tetracycline Hives  . Hydromorphone Hcl Nausea And Vomiting  . Niacin Other (See Comments)    Mouth blisters  . Niaspan [Niacin Er] Other (See Comments)    Mouth blisters  . Sulfa Antibiotics Nausea Only and Other (See Comments)    "Tears up stomach"  . Sulfonamide Derivatives Other (See Comments)    Reaction: per patient "tears her stomach up"  . Codeine Nausea And Vomiting  . Erythromycin Nausea And Vomiting  . Hydromorphone Hcl Nausea And Vomiting  . Morphine And Related Nausea And Vomiting  . Nalbuphine Nausea And Vomiting  . Sulfasalazine Nausea Only and Other (See Comments)    per patient "tears her stomach up", "Tears up stomach"  . Tape Rash and Other (See Comments)    No "plastic" tape," please    Patient Measurements: Height: 5\' 1"  (154.9 cm) Weight: 233 lb 0.4 oz (105.7 kg) IBW/kg (Calculated) : 47.8 Heparin Dosing Weight:  73.4 kg  Vital Signs: Temp: 98.3 F (36.8 C) (09/08 0831) Temp Source: Oral (09/08 0831) BP: 150/81 (09/08 0831) Pulse Rate: 72 (09/08 0831)  Labs: Recent Labs    04/22/18 0228  04/22/18 0309 04/22/18 1148 04/22/18 1205 04/23/18 0418 04/23/18 0941 04/24/18 0454  HGB  --    < > 11.9*  --   --  10.0*  --  10.0*  HCT  --   --  37.7  --   --  30.6*  --  31.0*  PLT  --   --  192  --   --  155  --  168  LABPROT  --   --   --   --  13.4 13.8  --  16.9*  INR  --   --   --   --  1.03 1.07  --  1.39  HEPARINUNFRC  --   --   --  0.31  --   --  0.45  --   CREATININE 0.92  --   --   --   --  1.05*  --  0.90   < > = values in this interval not displayed.   Estimated Creatinine Clearance: 74.5 mL/min (by C-G formula based on SCr of 0.9 mg/dL).  Assessment: 62 yo female with afib and on heparin (CHADSVASC=6). Adding coumadin (unable to use a DOAC with carbamazepine).  -heparin level =  0.45 -INR up = 1.07>1.39 following 2nd warfarin dose, Hgb stable ~10, PLTc wnl. -Noted drug-drug interaction between warfarin and carbamazepine  Goal of Therapy:  Heparin level 0.3-0.7 units/ml Monitor platelets by anticoagulation protocol: Yes    Plan:  -Continue heparin at 950 units/hr -Coumadin 10mg  PO x 1 tonight  -Daily PT/INR, heparin level and CBC  Thanks for allowing pharmacy to be a part of this patient's care.  Georga Bora, PharmD Clinical Pharmacist 04/24/2018 11:17 AM Please check AMION for all [place] numbers r

## 2018-04-25 ENCOUNTER — Encounter: Payer: Medicare Other | Admitting: Registered Nurse

## 2018-04-25 ENCOUNTER — Telehealth: Payer: Self-pay

## 2018-04-25 DIAGNOSIS — Z951 Presence of aortocoronary bypass graft: Secondary | ICD-10-CM

## 2018-04-25 DIAGNOSIS — E1159 Type 2 diabetes mellitus with other circulatory complications: Secondary | ICD-10-CM

## 2018-04-25 DIAGNOSIS — I10 Essential (primary) hypertension: Secondary | ICD-10-CM

## 2018-04-25 LAB — MAGNESIUM: Magnesium: 2.1 mg/dL (ref 1.7–2.4)

## 2018-04-25 LAB — PROTIME-INR
INR: 1.75
PROTHROMBIN TIME: 20.3 s — AB (ref 11.4–15.2)

## 2018-04-25 LAB — CBC
HEMATOCRIT: 31.8 % — AB (ref 36.0–46.0)
HEMOGLOBIN: 10.2 g/dL — AB (ref 12.0–15.0)
MCH: 31.4 pg (ref 26.0–34.0)
MCHC: 32.1 g/dL (ref 30.0–36.0)
MCV: 97.8 fL (ref 78.0–100.0)
Platelets: 169 10*3/uL (ref 150–400)
RBC: 3.25 MIL/uL — ABNORMAL LOW (ref 3.87–5.11)
RDW: 14.9 % (ref 11.5–15.5)
WBC: 3.8 10*3/uL — ABNORMAL LOW (ref 4.0–10.5)

## 2018-04-25 LAB — BASIC METABOLIC PANEL
Anion gap: 9 (ref 5–15)
BUN: 9 mg/dL (ref 6–20)
CHLORIDE: 104 mmol/L (ref 98–111)
CO2: 28 mmol/L (ref 22–32)
CREATININE: 0.87 mg/dL (ref 0.44–1.00)
Calcium: 9.3 mg/dL (ref 8.9–10.3)
GFR calc Af Amer: >60 mL/min (ref >60)
GFR calc non Af Amer: >60 mL/min (ref >60)
Glucose, Bld: 180 mg/dL — ABNORMAL HIGH (ref 70–99)
Potassium: 4.8 mmol/L (ref 3.5–5.1)
Sodium: 141 mmol/L (ref 135–145)

## 2018-04-25 LAB — GLUCOSE, CAPILLARY
GLUCOSE-CAPILLARY: 176 mg/dL — AB (ref 70–99)
GLUCOSE-CAPILLARY: 348 mg/dL — AB (ref 70–99)
Glucose-Capillary: 276 mg/dL — ABNORMAL HIGH (ref 70–99)
Glucose-Capillary: 241 mg/dL — ABNORMAL HIGH (ref 70–99)

## 2018-04-25 LAB — HEPARIN LEVEL (UNFRACTIONATED): HEPARIN UNFRACTIONATED: 0.31 [IU]/mL (ref 0.30–0.70)

## 2018-04-25 MED ORDER — WARFARIN SODIUM 10 MG PO TABS
10.0000 mg | ORAL_TABLET | Freq: Once | ORAL | Status: AC
  Administered 2018-04-25: 10 mg via ORAL
  Filled 2018-04-25: qty 1

## 2018-04-25 NOTE — Evaluation (Signed)
Occupational Therapy Evaluation and Discharge Patient Details Name: Katherine [name] MRN: [id number] DOB: Feb 16, 1956 Today's Date: 04/25/2018    History of Present Illness Pt is a 62 y.o. female s/p CABG x3. Pt presented to the ED on 04/19/18 with chest pain. PMH is significant for coronary artery disease status post CABG and subsequent stents, depression with anxiety, chronic pain, renal transplant recipient, paroxysmal atrial fibrillatio, and DM.    Clinical Impression   This 62 yo female admitted with above presents to acute OT at a Mod I level, no further OT needs identified, we will sign off.    Follow Up Recommendations  No OT follow up;Supervision - Intermittent    Equipment Recommendations  None recommended by OT       Precautions / Restrictions Precautions Precautions: None Restrictions Weight Bearing Restrictions: No Other Position/Activity Restrictions: Needs to keep right thumb clean and dry      Mobility Bed Mobility     General bed mobility comments: Pt up in recliner upon arrival  Transfers Overall transfer level: Modified independent Equipment used: None Transfers: Sit to/from Stand Sit to Stand: Supervision         General transfer comment: Pt supervision for safety. Pt demonstrated proper hand placement with transfers without the need for cues.     Balance Overall balance assessment: Mild deficits observed, not formally tested                                         ADL either performed or assessed with clinical judgement   ADL Overall ADL's : Modified independent                                       General ADL Comments: Pt and I found that now that she has wound vac off she can put an X-large glove (taped at bottom to make sure no water gets in) on her right hand to keep it clean and dry for bathing and peri care. I advised her to use 2 gloves for showering incase any holes in one of gloves and to ask RN about  supplies for changing bandage if it does get wet. Pt can donn/doff her own glove but pt's husband will have to tape bottom for her. I gave pt 12 pair of x-large gloves and one roll of small hyperfix tape (with recommending 2" masking tape once this is gone)     Vision Patient Visual Report: No change from baseline              Pertinent Vitals/Pain Pain Assessment: No/denies pain      Hand Dominance Right   Extremity/Trunk Assessment Upper Extremity Assessment Upper Extremity Assessment: RUE deficits/detail RUE Deficits / Details: unable to use right thumb in fine motor capacity due to bandage and wound RUE Coordination: decreased fine motor     Communication Communication Communication: No difficulties   Cognition Arousal/Alertness: Awake/alert Behavior During Therapy: WFL for tasks assessed/performed Overall Cognitive Status: Within Functional Limits for tasks assessed                                                Home Living Family/patient expects  to be discharged to:: Private residence Living Arrangements: Spouse/significant other Available Help at Discharge: Family;Available 24 hours/day Type of Home: Mobile home Home Access: Stairs to enter Entrance Stairs-Number of Steps: 4 Entrance Stairs-Rails: Can reach both Home Layout: One level     Bathroom Shower/Tub: Teacher, early years/pre: Standard     Home Equipment: Environmental consultant - 4 wheels;Cane - single point;Grab bars - tub/shower(1 step to get into Tub/shower unit)          Prior Functioning/Environment Level of Independence: Needs assistance    ADL's / Homemaking Assistance Needed: Since wound vac husband has be A'ing pt with bathing and peri care             OT Problem List: Impaired UE functional use;Impaired balance (sitting and/or standing)         OT Goals(Current goals can be found in the care plan section) Acute Rehab OT Goals Patient Stated Goal: to go home  OT  Frequency:                AM-PAC PT "6 Clicks" Daily Activity     Outcome Measure Help from another person eating meals?: None Help from another person taking care of personal grooming?: None Help from another person toileting, which includes using toliet, bedpan, or urinal?: None Help from another person bathing (including washing, rinsing, drying)?: None Help from another person to put on and taking off regular upper body clothing?: None Help from another person to put on and taking off regular lower body clothing?: None 6 Click Score: 24   End of Session Equipment Utilized During Treatment: (none) Nurse Communication: (Pt up walking with me in her room with RN present)  Activity Tolerance: Patient tolerated treatment well Patient left: in chair;with call bell/phone within reach;with chair alarm set  OT Visit Diagnosis: Unsteadiness on feet (R26.81)                Time: 2751-7001 OT Time Calculation (min): 35 min Charges:  OT General Charges $OT Visit: 1 Visit OT Evaluation $OT Eval Moderate Complexity: 1 Mod OT Treatments $Self Care/Home Management : 8-22 mins  [place], OTR/L Acute [employer] Pager [phone] Office [phone]

## 2018-04-25 NOTE — Progress Notes (Signed)
Physical Therapy Evaluation Patient Details Name: Katherine [name] MRN: [id number] DOB: Dec 23, 1955 Today's Date: 04/25/2018   History of Present Illness  Pt is a 62 y.o. female s/p CABG x3. Pt presented to the ED on 04/19/18 with chest pain. PMH is significant for coronary artery disease status post CABG and subsequent stents, depression with anxiety, chronic pain, renal transplant recipient, paroxysmal atrial fibrillatio, and DM.   Clinical Impression  Patient is s/p above surgery resulting in deficits listed below (see PT Problem List).PTA, pt was independent with mobility and did not require an AD. At the time of evaluation pt performed ambulation with gross supervision. VSS throughout session, with a max HR noted at 92 bpm during gait.  Pt educated on overall safety with mobility and positioning. Will continue to follow acutely while admitted to increase her independence and safety with mobility to allow discharge to the venue listed below.       Follow Up Recommendations Supervision for mobility/OOB;No PT follow up    Equipment Recommendations  None recommended by PT    Recommendations for Other Services       Precautions / Restrictions Precautions Precautions: Fall Restrictions Weight Bearing Restrictions: No Other Position/Activity Restrictions: Needs to keep right thumb clean and dry      Mobility  Bed Mobility Overal bed mobility: Modified Independent Bed Mobility: Supine to Sit     Supine to sit: Modified independent (Device/Increase time)     General bed mobility comments: Increased time and effort noted. Min cues for hand placement  Transfers Overall transfer level: Needs assistance Equipment used: None Transfers: Sit to/from Stand Sit to Stand: Supervision         General transfer comment: Pt supervision for safety. Pt demonstrated proper hand placement with transfers without the need for cues.   Ambulation/Gait Ambulation/Gait assistance:  Supervision Gait Distance (Feet): 100 Feet Assistive device: None Gait Pattern/deviations: Step-through pattern Gait velocity: decreased Gait velocity interpretation: 1.31 - 2.62 ft/sec, indicative of limited community ambulator General Gait Details: Pt supervision for safety. Pt denies any chest pain with gait. Pt did not require any assist for steadying.   Stairs            Wheelchair Mobility    Modified Rankin (Stroke Patients Only)       Balance Overall balance assessment: Mild deficits observed, not formally tested                                           Pertinent Vitals/Pain Pain Assessment: No/denies pain Faces Pain Scale: Hurts a little bit Pain Location: HA Pain Descriptors / Indicators: Aching;Discomfort Pain Intervention(s): Limited activity within patient's tolerance;Monitored during session;Repositioned    Home Living Family/patient expects to be discharged to:: Private residence Living Arrangements: Spouse/significant other Available Help at Discharge: Family;Available 24 hours/day Type of Home: Mobile home Home Access: Stairs to enter Entrance Stairs-Rails: Can reach both Entrance Stairs-Number of Steps: 4 Home Layout: One level Home Equipment: Walker - 4 wheels;Cane - single point;Grab bars - tub/shower(1 step to get into Tub/shower unit)      Prior Function Level of Independence: Independent      ADL's / Homemaking Assistance Needed: Since wound vac husband has be A'ing pt with bathing and peri care         Hand Dominance   Dominant Hand: Right    Extremity/Trunk Assessment   Upper Extremity  Assessment Upper Extremity Assessment: RUE deficits/detail RUE Deficits / Details: unable to use right thumb in fine motor capacity due to bandage and wound RUE Coordination: decreased fine motor    Lower Extremity Assessment Lower Extremity Assessment: Overall WFL for tasks assessed    Cervical / Trunk  Assessment Cervical / Trunk Assessment: Other exceptions Cervical / Trunk Exceptions: s/p CABG  Communication   Communication: No difficulties  Cognition Arousal/Alertness: Awake/alert Behavior During Therapy: WFL for tasks assessed/performed Overall Cognitive Status: Within Functional Limits for tasks assessed                                        General Comments      Exercises     Assessment/Plan    PT Assessment Patient needs continued PT services  PT Problem List Decreased strength;Decreased activity tolerance;Decreased range of motion;Decreased mobility;Pain       PT Treatment Interventions Gait training;Stair training;Functional mobility training;Therapeutic activities;Therapeutic exercise;Balance training;Modalities;Patient/family education    PT Goals (Current goals can be found in the Care Plan section)  Acute Rehab PT Goals Patient Stated Goal: to go home PT Goal Formulation: With patient Time For Goal Achievement: 05/02/18 Potential to Achieve Goals: Good    Frequency Min 3X/week   Barriers to discharge        Co-evaluation               AM-PAC PT "6 Clicks" Daily Activity  Outcome Measure Difficulty turning over in bed (including adjusting bedclothes, sheets and blankets)?: A Little Difficulty moving from lying on back to sitting on the side of the bed? : A Little Difficulty sitting down on and standing up from a chair with arms (e.g., wheelchair, bedside commode, etc,.)?: A Little Help needed moving to and from a bed to chair (including a wheelchair)?: A Little Help needed walking in hospital room?: A Little Help needed climbing 3-5 steps with a railing? : A Lot 6 Click Score: 17    End of Session Equipment Utilized During Treatment: Gait belt Activity Tolerance: Patient tolerated treatment well Patient left: in chair;with call bell/phone within reach;with chair alarm set Nurse Communication: Mobility status PT Visit  Diagnosis: Other abnormalities of gait and mobility (R26.89);Muscle weakness (generalized) (M62.81);Pain Pain - part of body: (HA)    Time: 0488-8916 PT Time Calculation (min) (ACUTE ONLY): 24 min   Charges:   PT Evaluation $PT Eval Moderate Complexity: 1 Mod PT Treatments $Gait Training: 8-22 mins        Einar Crow, [place]  Student Physical Therapist Acute Rehab [phone]   Einar Crow 04/25/2018, 11:54 AM

## 2018-04-25 NOTE — Telephone Encounter (Signed)
Pt called requesting a refill on Oxycodone 5 mg last filled 03/02/18. She states she is in the hospital right now.

## 2018-04-25 NOTE — Progress Notes (Signed)
Progress Note   Subjective   Doing well today, the patient denies CP or SOB.  She has had headaches in the mornings.   No new concerns  Inpatient Medications    Scheduled Meds: . azaTHIOprine  125 mg Oral QPM  . calcitRIOL  0.25 mcg Oral Q3 days  . carbamazepine  200 mg Oral BID  . cyclobenzaprine  10 mg Oral BID  . diltiazem  120 mg Oral Daily  . dofetilide  250 mcg Oral BID  . DULoxetine  60 mg Oral q morning - 10a  . famotidine  20 mg Oral Daily  . fluticasone  2 spray Each Nare Daily  . insulin aspart  0-9 Units Subcutaneous TID WC  . insulin glargine  8 Units Subcutaneous QHS  . LORazepam  1 mg Oral q morning - 10a  . metoprolol tartrate  25 mg Oral BID  . mometasone-formoterol  2 puff Inhalation BID  . montelukast  10 mg Oral QHS  . pantoprazole  40 mg Oral Daily  . patient's guide to using coumadin book   Does not apply Once  . predniSONE  5 mg Oral Q breakfast  . rosuvastatin  20 mg Oral q1800  . sertraline  100 mg Oral q morning - 10a  . sodium chloride flush  3 mL Intravenous Q12H  . sodium chloride flush  3 mL Intravenous Q12H  . Warfarin - Pharmacist Dosing Inpatient   Does not apply q1800   Continuous Infusions: . sodium chloride    . sodium chloride 10 mL/hr at 04/24/18 1800  . heparin 950 Units/hr (04/24/18 1233)   PRN Meds: sodium chloride, sodium chloride, acetaminophen, guaiFENesin, guaiFENesin-dextromethorphan, levalbuterol, nitroGLYCERIN, oxyCODONE, promethazine, sodium chloride flush, sodium chloride flush   Vital Signs    Vitals:   04/24/18 1555 04/24/18 1936 04/25/18 0345 04/25/18 0746  BP: (!) 149/66 129/78 110/68   Pulse: 68 75 70 67  Resp:  (!) 22 13 15   Temp:  97.8 F (36.6 C) 98 F (36.7 C)   TempSrc:  Oral Oral   SpO2:  99% 99% 98%  Weight:   105 kg   Height:        Intake/Output Summary (Last 24 hours) at 04/25/2018 4403 Last data filed at 04/24/2018 2000 Gross per 24 hour  Intake 830.01 ml  Output -  Net 830.01 ml    Filed Weights   04/23/18 0131 04/24/18 0419 04/25/18 0345  Weight: 105 kg 105.7 kg 105 kg    Telemetry    Sinus rhythm, no arrhythmias, Qtc 450 msec - Personally Reviewed  Physical Exam   GEN- The patient is well appearing, alert and oriented x 3 today.   Head- normocephalic, atraumatic Eyes-  Sclera clear, conjunctiva pink Ears- hearing intact Oropharynx- clear Neck- supple, Lungs- normal work of breathing Heart- Regular rate and rhythm  Extremities- no clubbing, cyanosis, or edema  MS- no significant deformity or atrophy Skin- no rash or lesion Psych- euthymic mood, full affect Neuro- strength and sensation are intact   Labs    Chemistry Recent Labs  Lab 04/23/18 0418 04/24/18 0454 04/25/18 0543  NA 140 139 141  K 4.2 4.2 4.8  CL 108 106 104  CO2 23 22 28   GLUCOSE 169* 203* 180*  BUN 9 13 9   CREATININE 1.05* 0.90 0.87  CALCIUM 8.9 9.0 9.3  GFRNONAA 57* >60 >60  GFRAA >60 >60 >60  ANIONGAP 9 11 9      Hematology Recent Labs  Lab 04/23/18  7096 04/24/18 0454 04/25/18 0543  WBC 4.0 3.9* 3.8*  RBC 3.16* 3.20* 3.25*  HGB 10.0* 10.0* 10.2*  HCT 30.6* 31.0* 31.8*  MCV 96.8 96.9 97.8  MCH 31.6 31.3 31.4  MCHC 32.7 32.3 32.1  RDW 14.7 15.0 14.9  PLT 155 168 169    Cardiac Enzymes Recent Labs  Lab 04/19/18 2121 04/20/18 0352 04/20/18 0740 04/20/18 1536  TROPONINI 0.03* <0.03 0.03* <0.03    Recent Labs  Lab 04/19/18 1846  TROPIPOC 0.02        Assessment & Plan    1.  Persistent afib/ atrial flutter Maintaining sinus rhythm with tikosyn I have personally reviewed ekgs and qtc remains stable to continue current dose. My review of ekg from last night reveals corrected qtc 461 msec On coumadin due to concerns of DOAC interaction with carbamazepine As she was chemically cardioverted this admit, I would advise bridge until INR > 2.  This could be accomplished with lovenox if she is otherwise ready for discharge. Follow-up in AF clinic within 1  week post discharge  2. CAD s/p CABG Cath this admission reveals stable CAD Medical management advised  3. HTN Stable No change required today  OK to discharge from EP standpoint I will arrange AF clinic follow-up within 1 week Will need INR follow-up arranged also.  She could have this done with Dr Ronnald Ramp with [facility] primary care or through [facility] office  EP to follow while here.  Thompson Grayer MD, [facility] 04/25/2018 8:12 AM

## 2018-04-25 NOTE — Consult Note (Addendum)
   [facility] CM Inpatient Consult   04/25/2018  Katherine [name] 05-27-1956 [id number]    Patient screened for potential [facility] Care Management services due to unplanned readmission risk score of 27% (high) and multiple hospitalizations.  Went to bedside to speak with Katherine [name] about [facility] program services. She is agreeable and [facility] written consent obtained. [facility] folder provided.  Discussed [facility] Care Management follow up for chronic disease management and education. Explained that [place] [facility] will not interfere or replace any other services such as home health..   Katherine [name] endorses her Primary Care MD is Dr. Ronnald Ramp Velora Heckler at [place] is listed as doing transition of care calls).  Katherine [name] states she lives with her husband. Denies having any concerns with medications or transportation. Confirmed best contact number for Katherine [name] as [phone].  Katherine [name] has a history of CAD, s/p CABG, afib/aflutter, IDDM, renal transplant, HTN.  Will make referral for [place].  Notification sent inpatient RNCM to make aware [facility] will follow post discharge.   Marthenia Rolling, MSN-Ed, RN,BSN [facility] Liaison [phone]

## 2018-04-25 NOTE — Progress Notes (Signed)
[facility] PROGRESS NOTE  Katherine [name] IEP:[id number] DOB: Aug 02, 1956 DOA: 04/19/2018  PCP: [name], MD  Brief History/Interval Summary: 62 y.o. female with medical history significant for coronary artery disease status post CABG and subsequent stents, depression with anxiety, chronic pain, renal transplant recipient, paroxysmal atrial fibrillation not on anticoagulation, and insulin-dependent diabetes mellitus, presented to the emergency department with chest pain.  Patient reports that she was in her usual state of health and having an uneventful day when she developed sudden onset of rapid palpitations while at rest.  This was followed by discomfort across the anterior chest, radiating up into the bilateral jaw, and associated with nausea and diaphoresis.  Patient was seen in the emergency department.  She was given a dose of intravenous Cardizem with improvement in heart rate.  She was hospitalized for further management. Patient seen by cardiology and electrophysiology.  Started on Tikosyn load.  She also underwent cardiac catheterization.  Reason for Visit: Paroxysmal atrial fibrillation.  Chest pain.  Consultants: Cardiology  Procedures:  Cardiac catheterization 9/5 Conclusion     LV end diastolic pressure is normal.  _________________________________________________  Colon Flattery LAD to Prox LAD lesion is 50% stenosed. Prox LAD-1 lesion is 100% stenosed prior to D1. Prox LAD-2 lesion is 100% stenosed after D1.  SVG-D1 graft was visualized by angiography and is large. Dist Graft to Insertion STENT is 20% stenosed.  The Native Cx is widely patent with a small 1st Marg & 2 Small distal LPL branches.  Ost 1st Mrg lesion is 90% stenosed. Small Caliber Vessel - not good PCI/PTCA target  Ost RCA to Dist RCA lesion is 100% stenosed. - Unable to engage. (known occlusion)  SVG-dRCA graft was visualized by angiography and is large. The graft exhibits minimal luminal  irregularities.  LIMA graft was not injected. 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   Severe native coronary disease with occluded RCA and proximal LAD.  Severe, but stable ostial OM1 disease Patent SVG-D1 with stent in place. Patent SVG-dRCA Due to access issues, unable to visualize LIMA graft. Severe focal calcified lesion in the right common femoral artery: Unable to obtain femoral arterial access despite using ultrasound guidance -unable to advance wire.    Antibiotics: None  Subjective/Interval History: Patient states that she is feeling better.  Headaches have improved.  Remains anxious about her various medical conditions.  Denies any chest pain or shortness of breath.  ROS: Denies any abdominal pain nausea or vomiting  Objective:  Vital Signs  Vitals:   04/24/18 1555 04/24/18 1936 04/25/18 0345 04/25/18 0746  BP: (!) 149/66 129/78 110/68   Pulse: 68 75 70 67  Resp:  (!) 22 13 15   Temp:  97.8 F (36.6 C) 98 F (36.7 C)   TempSrc:  Oral Oral   SpO2:  99% 99% 98%  Weight:   105 kg   Height:        Intake/Output Summary (Last 24 hours) at 04/25/2018 0955 Last data filed at 04/24/2018 2000 Gross per 24 hour  Intake 830.01 ml  Output -  Net 830.01 ml   Filed Weights   04/23/18 0131 04/24/18 0419 04/25/18 0345  Weight: 105 kg 105.7 kg 105 kg    General appearance: Awake alert.  In no  distress Resp: Normal effort at rest.  No crackles or wheezing appreciated. Cardio: S1-S2 remains normal regular.  Telemetry shows sinus rhythm with few PVCs. GI: Abdomen remains soft.  Nontender nondistended Extremities: No edema Neurologic: No obvious focal neurological deficits.  Lab  Results:  Data Reviewed: I have personally reviewed following labs and imaging studies  CBC: Recent Labs  Lab 04/21/18 0307 04/22/18 0309 04/23/18 0418 04/24/18 0454 04/25/18 0543  WBC 5.1 5.6 4.0 3.9* 3.8*  HGB 12.0 11.9* 10.0* 10.0* 10.2*  HCT 37.9 37.7 30.6* 31.0* 31.8*  MCV 97.9 98.7 96.8 96.9 97.8  PLT 199 192 155 168 702    Basic Metabolic Panel: Recent Labs  Lab 04/21/18 0307 04/21/18 1642 04/22/18 0228 04/23/18 0418 04/24/18 0454 04/25/18 0543  NA 141  --  137 140 139 141  K 3.9 4.4 3.7 4.2 4.2 4.8  CL 108  --  103 108 106 104  CO2 21*  --  24 23 22 28   GLUCOSE 151*  --  240* 169* 203* 180*  BUN 13  --  8 9 13 9   CREATININE 0.89  --  0.92 1.05* 0.90 0.87  CALCIUM 9.1  --  8.6* 8.9 9.0 9.3  MG  --  1.7 2.3 1.9 1.8 2.1    GFR: Estimated Creatinine Clearance: 76.7 mL/min (by C-G formula based on SCr of 0.87 mg/dL).  Cardiac Enzymes: Recent Labs  Lab 04/19/18 2121 04/20/18 0352 04/20/18 0740 04/20/18 1536  TROPONINI 0.03* <0.03 0.03* <0.03    CBG: Recent Labs  Lab 04/24/18 0753 04/24/18 1124 04/24/18 1554 04/24/18 2129 04/25/18 0725  GLUCAP 177* 227* 252* 115* 176*     Radiology Studies: No results found.   Medications:  Scheduled: . azaTHIOprine  125 mg Oral QPM  . calcitRIOL  0.25 mcg Oral Q3 days  . carbamazepine  200 mg Oral BID  . cyclobenzaprine  10 mg Oral BID  . diltiazem  120 mg Oral Daily  . dofetilide  250 mcg Oral BID  . DULoxetine  60 mg Oral q morning - 10a  . famotidine  20 mg Oral Daily  . fluticasone  2 spray Each Nare Daily  . insulin aspart  0-9 Units Subcutaneous TID WC  . insulin glargine  8 Units Subcutaneous QHS  . LORazepam  1 mg Oral q morning - 10a  . metoprolol tartrate  25 mg Oral BID  . mometasone-formoterol  2 puff Inhalation BID  . montelukast  10 mg Oral QHS  . pantoprazole  40 mg Oral Daily  . patient's guide to using coumadin book   Does not apply Once  . predniSONE  5 mg Oral Q breakfast  .  rosuvastatin  20 mg Oral q1800  . sertraline  100 mg Oral q morning - 10a  . sodium chloride flush  3 mL Intravenous Q12H  . sodium chloride flush  3 mL Intravenous Q12H  . Warfarin - Pharmacist Dosing Inpatient   Does not apply q1800   Continuous: . sodium chloride    . sodium chloride 10 mL/hr at 04/24/18 1800  . heparin 950 Units/hr (04/24/18 1233)   OVZ:CHYIFO chloride, sodium chloride, acetaminophen, guaiFENesin, guaiFENesin-dextromethorphan, levalbuterol, nitroGLYCERIN, oxyCODONE, promethazine, sodium chloride flush, sodium chloride flush  Assessment/Plan:    Chest pain in the setting of known coronary artery disease Chest pain could have been due to tachyarrhythmia.  No significant elevation in troponin level was noted.  Patient underwent cardiac catheterization.  No significant disease was noted.  No intervention  was performed.  Patient remains chest pain-free.  Instead of antiplatelet agents patient will be on warfarin.  Paroxysmal atrial fibrillation She was in atrial fibrillation with RVR at present patient had heart rate of 120s.  She was given diltiazem x1.  She converted to sinus rhythm.  She converted back to atrial fibrillation.  Electrophysiology has also been following.  Patient is being loaded with Tikosyn which is being monitored by cardiology.  Anticoagulation with warfarin.  Remains on IV heparin.  Patient also on oral Cardizem.  Echocardiogram last done in March which showed normal systolic function, grade 2 diastolic dysfunction and mild aortic stenosis.  Per cardiology she needs to be off heparin products until INR is therapeutic.  INR is 1.75 today.  Anticipate it will be 2.0 tomorrow.  Continue IV heparin for now.  Insulin-dependent diabetes mellitus HbA1c was 7.1 in June.  Continue current regimen for now.  She is noted to have elevated CBGs in the afternoon hours.  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  She  had a wound VAC in place which was removed on 9/5.  She will need to follow-up with her hand surgeon next week.  2 new dressing changes.  Chronic pain Continue home regimen with Cymbalta Flexeril and oxycodone as needed.  History of depression with anxiety Continue home medications.  Renal transplant recipient Creatinine is at baseline.  Continue her prednisone and azathioprine.    DVT Prophylaxis: On IV heparin and warfarin Code Status: Full code Family Communication: Discussed with the patient Disposition Plan: Management as outlined above.  Discharge when INR is therapeutic.    LOS: 4 days   [facility] Pager [phone] 04/25/2018, 9:55 AM  If 7PM-7AM, please contact night-coverage at [email or web address], password [facility]

## 2018-04-25 NOTE — Progress Notes (Signed)
ANTICOAGULATION CONSULT NOTE - Follow Up Consult  Pharmacy Consult for Heparin/Coumadin Indication: Afib and ACS  Allergies  Allergen Reactions  . Tetracycline Hives  . Hydromorphone Hcl Nausea And Vomiting  . Niacin Other (See Comments)    Mouth blisters  . Niaspan [Niacin Er] Other (See Comments)    Mouth blisters  . Sulfa Antibiotics Nausea Only and Other (See Comments)    "Tears up stomach"  . Sulfonamide Derivatives Other (See Comments)    Reaction: per patient "tears her stomach up"  . Codeine Nausea And Vomiting  . Erythromycin Nausea And Vomiting  . Hydromorphone Hcl Nausea And Vomiting  . Morphine And Related Nausea And Vomiting  . Nalbuphine Nausea And Vomiting  . Sulfasalazine Nausea Only and Other (See Comments)    per patient "tears her stomach up", "Tears up stomach"  . Tape Rash and Other (See Comments)    No "plastic" tape," please    Labs: Recent Labs    04/23/18 0418 04/23/18 0941 04/24/18 0454 04/24/18 1152 04/25/18 0543  HGB 10.0*  --  10.0*  --  10.2*  HCT 30.6*  --  31.0*  --  31.8*  PLT 155  --  168  --  169  LABPROT 13.8  --  16.9*  --  20.3*  INR 1.07  --  1.39  --  1.75  HEPARINUNFRC  --  0.45  --  0.45 0.31  CREATININE 1.05*  --  0.90  --  0.87   Estimated Creatinine Clearance: 76.7 mL/min (by C-G formula based on SCr of 0.87 mg/dL).  Assessment: 62 yo female with afib and on heparin (CHADSVASC=6). Adding coumadin (unable to use a DOAC with carbamazepine).  Heparin level therapeutic INR = 1.75  Goal of Therapy:  Heparin level 0.3-0.7 units/ml Monitor platelets by anticoagulation protocol: Yes    Plan:  -Continue heparin at 950 units/hr -Repeat Coumadin 10mg  PO x 1 tonight  -Daily PT/INR, heparin level and CBC  Thanks for allowing pharmacy to be a part of this patient's care. Anette Guarneri, PharmD [phone]  04/25/2018 9:55 AM Please check AMION for all [facility] numbers

## 2018-04-26 LAB — BASIC METABOLIC PANEL
Anion gap: 15 (ref 5–15)
BUN: 12 mg/dL (ref 6–20)
CALCIUM: 9.3 mg/dL (ref 8.9–10.3)
CO2: 23 mmol/L (ref 22–32)
Chloride: 98 mmol/L (ref 98–111)
Creatinine, Ser: 1.09 mg/dL — ABNORMAL HIGH (ref 0.44–1.00)
GFR calc Af Amer: >60 mL/min (ref >60)
GFR, EST NON AFRICAN AMERICAN: 54 mL/min — AB (ref >60)
GLUCOSE: 223 mg/dL — AB (ref 70–99)
Potassium: 4.2 mmol/L (ref 3.5–5.1)
SODIUM: 136 mmol/L (ref 135–145)

## 2018-04-26 LAB — HEPARIN LEVEL (UNFRACTIONATED)

## 2018-04-26 LAB — PROTIME-INR
INR: 2.08
Prothrombin Time: 23.3 seconds — ABNORMAL HIGH (ref 11.4–15.2)

## 2018-04-26 LAB — GLUCOSE, CAPILLARY
GLUCOSE-CAPILLARY: 189 mg/dL — AB (ref 70–99)
GLUCOSE-CAPILLARY: 257 mg/dL — AB (ref 70–99)

## 2018-04-26 LAB — MAGNESIUM: MAGNESIUM: 1.8 mg/dL (ref 1.7–2.4)

## 2018-04-26 MED ORDER — PROMETHAZINE HCL 25 MG PO TABS
12.5000 mg | ORAL_TABLET | Freq: Four times a day (QID) | ORAL | Status: DC | PRN
  Administered 2018-04-26 (×2): 12.5 mg via ORAL
  Filled 2018-04-26 (×2): qty 1

## 2018-04-26 MED ORDER — WARFARIN SODIUM 7.5 MG PO TABS: 7.5000 mg | ORAL_TABLET | Freq: Once | ORAL | Status: DC

## 2018-04-26 MED ORDER — WARFARIN SODIUM 2.5 MG PO TABS: ORAL_TABLET | ORAL | 0 refills | Status: DC

## 2018-04-26 MED ORDER — DOFETILIDE 250 MCG PO CAPS: 250.0000 ug | ORAL_CAPSULE | Freq: Two times a day (BID) | ORAL | 6 refills | Status: DC

## 2018-04-26 NOTE — Discharge Summary (Signed)
[facility]  Physician Discharge Summary   Patient ID: Katherine [name] MRN: [id number] DOB/AGE: 61-07-1956 61 y.o.  Admit date: 04/19/2018 Discharge date: 04/26/2018  PCP: [name], MD  DISCHARGE DIAGNOSES:  Paroxysmal atrial fibrillation Coronary artery disease Insulin-dependent diabetes mellitus History of renal transplant  RECOMMENDATIONS FOR OUTPATIENT FOLLOW UP: 1. Cardiology to arrange outpatient follow-up as well as PT/INR checks 2. She needs to follow-up with her PCP or nephrologist for blood work in 1 to 2 weeks  DISCHARGE CONDITION: fair  Diet recommendation: Modified carbohydrate  Filed Weights   04/24/18 0419 04/25/18 0345 04/26/18 0602  Weight: 105.7 kg 105 kg 104.7 kg    INITIAL HISTORY: 62 y.o.femalewith medical history significant forcoronary artery disease status post CABG and subsequent stents, depression with anxiety, chronic pain, renal transplant recipient, paroxysmal atrial fibrillation not on anticoagulation, and insulin-dependent diabetes mellitus, presented to the emergency department with chest pain. Patient reports that she was in her usual state of health and having an uneventful day when she developed sudden onset of rapid palpitations while at rest. This was followed by discomfort across the anterior chest, radiating up into the bilateral jaw, and associated with nausea and diaphoresis.  Patient was seen in the emergency department.  She was given a dose of intravenous Cardizem with improvement in heart rate.  She was hospitalized for further management. Patient seen by cardiology and electrophysiology.  Started on Tikosyn load.  She also underwent cardiac catheterization.  Consultants: Cardiology  Procedures:  Cardiac catheterization 9/5 Conclusion     LV end diastolic pressure is normal.  _________________________________________________  Colon Flattery LAD to Prox LAD lesion is 50% stenosed. Prox LAD-1 lesion is 100%  stenosed prior to D1. Prox LAD-2 lesion is 100% stenosed after D1.  SVG-D1 graft was visualized by angiography and is large. Dist Graft to Insertion STENT is 20% stenosed.  The Native Cx is widely patent with a small 1st Marg & 2 Small distal LPL branches.  Ost 1st Mrg lesion is 90% stenosed. Small Caliber Vessel - not good PCI/PTCA target  Ost RCA to Dist RCA lesion is 100% stenosed. - Unable to engage. (known occlusion)  SVG-dRCA graft was visualized by angiography and is large. The graft exhibits minimal luminal irregularities.  LIMA graft was not injected. 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  Severe native coronary disease with occluded RCA and proximal LAD. Severe, but stable ostial OM1 disease Patent SVG-D1 with stent in place. Patent SVG-dRCA Due to access issues, unable to visualize LIMA graft. Severe focal calcified lesion in the right common femoral artery: Unable to obtain femoral arterial access despite using ultrasound guidance -unable to advance wire.    HOSPITAL COURSE:   Paroxysmal atrial fibrillation She was in atrial fibrillation with RVR at present patient had heart rate of 120s.  She was given diltiazem x1.  She converted to sinus rhythm.  She then converted back to atrial fibrillation.  Electrophysiology has also been following.  Patient was loaded with Tikosyn.  Anticoagulation with warfarin.  She was bridged with IV heparin.  INR is therapeutic today.  Patient has been in sinus rhythm for the last 3 days.  She is also on oral Cardizem.  Echocardiogram last done in March which showed normal systolic function, grade 2  diastolic dysfunction and mild aortic stenosis.    Chest pain in  the setting of known coronary artery disease Chest pain could have been due to tachyarrhythmia.  No significant elevation in troponin level was noted.  Patient underwent cardiac catheterization.  No significant disease was noted.  No intervention was performed.  Patient remains chest pain-free.  Instead of antiplatelet agents patient will be on warfarin.  Insulin-dependent diabetes mellitus HbA1c was 7.1 in June.  Continue current regimen for now.  Renal transplant recipient Creatinine has been fluctuating but stable for the most part.  Good urine output.  Continue with home medications including prednisone and azathioprine.  Outpatient follow-up with nephrology or PCP.  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  She had a wound VAC in place which was removed on 9/5.  She will need to follow-up with her hand surgeon next week.    Chronic pain Continue home regimen with Cymbalta Flexeril and oxycodone as needed.  History of depression with anxiety Continue home medications.  Overall stable.  Okay for discharge home today.    PERTINENT LABS:  The results of significant diagnostics from this hospitalization (including imaging, microbiology, ancillary and laboratory) are listed below for reference.    Microbiology: Recent Results (from the past 240 hour(s))  MRSA PCR Screening     Status: None   Collection Time: 04/20/18 12:23 PM  Result Value Ref Range Status   MRSA by PCR NEGATIVE NEGATIVE Final    Comment:        The GeneXpert MRSA Assay (FDA approved for NASAL specimens only), is one component of a comprehensive MRSA colonization surveillance program. It is not intended to diagnose MRSA infection nor to guide or monitor treatment for MRSA infections. Performed at [facility], [place] [address] [place], [place] [address]      Labs: Basic Metabolic Panel: Recent Labs  Lab 04/22/18 0228 04/23/18 0418  04/24/18 0454 04/25/18 0543 04/26/18 0243 04/26/18 0745  NA 137 140 139 141 136  --   K 3.7 4.2 4.2 4.8 4.2  --   CL 103 108 106 104 98  --   CO2 24 23 22 28 23   --   GLUCOSE 240* 169* 203* 180* 223*  --   BUN 8 9 13 9 12   --   CREATININE 0.92 1.05* 0.90 0.87 1.09*  --   CALCIUM 8.6* 8.9 9.0 9.3 9.3  --   MG 2.3 1.9 1.8 2.1  --  1.8   CBC: Recent Labs  Lab 04/21/18 0307 04/22/18 0309 04/23/18 0418 04/24/18 0454 04/25/18 0543  WBC 5.1 5.6 4.0 3.9* 3.8*  HGB 12.0 11.9* 10.0* 10.0* 10.2*  HCT 37.9 37.7 30.6* 31.0* 31.8*  MCV 97.9 98.7 96.8 96.9 97.8  PLT 199 192 155 168 169   Cardiac Enzymes: Recent Labs  Lab 04/19/18 2121 04/20/18 0352 04/20/18 0740 04/20/18 1536  TROPONINI 0.03* <0.03 0.03* <0.03    CBG: Recent Labs  Lab 04/25/18 1123 04/25/18 1553 04/25/18 2129 04/26/18 0721 04/26/18 1120  GLUCAP 348* 276* 241* 189* 257*     IMAGING STUDIES Dg Chest 2 View  Result Date: 04/19/2018 CLINICAL DATA:  Chest pain. EXAM: CHEST - 2 VIEW COMPARISON:  Chest x-ray dated February 03, 2018. FINDINGS: The patient is rotated to the left. Stable cardiomegaly status post CABG. Normal pulmonary vascularity. No focal consolidation, pleural effusion, or pneumothorax. No acute osseous abnormality. Multiple broken median sternotomy wires are unchanged. IMPRESSION:  Stable cardiomegaly.  No active cardiopulmonary disease. Electronically Signed   By: Titus Dubin M.D.   On: 04/19/2018 18:31    DISCHARGE EXAMINATION: Vitals:   04/25/18 2128 04/25/18 2150 04/26/18 0602 04/26/18 1302  BP: 99/65 116/67 125/72 (!) 161/75  Pulse: 71 73 70 80  Resp: 17     Temp: 97.6 F (36.4 C)  98.8 F (37.1 C) 98.1 F (36.7 C)  TempSrc: Oral  Oral Oral  SpO2: 98%  92% 99%  Weight:   104.7 kg   Height:       General appearance: alert, cooperative, appears stated age and no distress Resp: clear to auscultation bilaterally Cardio: regular rate and rhythm, S1, S2 normal, no murmur, click, rub  or gallop GI: soft, non-tender; bowel sounds normal; no masses,  no organomegaly  DISPOSITION: Home  Discharge Instructions    AMB Referral to [facility] Management   Complete by:  As directed    Please assign to [facility] for complex case management. Written consent obtained. PCP ([facility] listed as doing toc). High risk for readmission score of 27% (high) and multiple hospitalizations. Currently at [facility]. Please call with questions. Marthenia Rolling, [place], RN,BSN-THN [facility] [phone]   Reason for consult:  Please assign to [facility] RNCM   Diagnoses of:   Diabetes Other     Other Diagnosis:  afib, aflutter   Expected date of contact:  1-3 days (reserved for hospital discharges)   Call MD for:  difficulty breathing, headache or visual disturbances   Complete by:  As directed    Call MD for:  extreme fatigue   Complete by:  As directed    Call MD for:  persistant dizziness or light-headedness   Complete by:  As directed    Call MD for:  persistant nausea and vomiting   Complete by:  As directed    Call MD for:  severe uncontrolled pain   Complete by:  As directed    Call MD for:  temperature >100.4   Complete by:  As directed    Diet - low sodium heart healthy   Complete by:  As directed    Discharge instructions   Complete by:  As directed    Please keep all your follow-up appointments.  Seek attention immediately if you notice any bleeding.  Please follow-up with your primary care provider or with your nephrologist within the next 1 week for blood work.  You were cared for by a hospitalist during your hospital stay. If you have any questions about your discharge medications or the care you received while you were in the hospital after you are discharged, you can call the unit and asked to speak with the hospitalist on call if the hospitalist that took care of you is not available. Once you are discharged, your primary care physician  will handle any further medical issues. Please note that NO REFILLS for any discharge medications will be authorized once you are discharged, as it is imperative that you return to your primary care physician (or establish a relationship with a primary care physician if you do not have one) for your aftercare needs so that they can reassess your need for medications and monitor your lab values. If you do not have a primary care physician, you can call [phone] for a physician referral.   Increase activity slowly   Complete by:  As directed         Allergies as  of 04/26/2018      Reactions   Tetracycline Hives   Hydromorphone Hcl Nausea And Vomiting   Niacin Other (See Comments)   Mouth blisters   Niaspan [niacin Er] Other (See Comments)   Mouth blisters   Sulfa Antibiotics Nausea Only, Other (See Comments)   "Tears up stomach"   Sulfonamide Derivatives Other (See Comments)   Reaction: per patient "tears her stomach up"   Codeine Nausea And Vomiting   Erythromycin Nausea And Vomiting   Hydromorphone Hcl Nausea And Vomiting   Morphine And Related Nausea And Vomiting   Nalbuphine Nausea And Vomiting   Sulfasalazine Nausea Only, Other (See Comments)   per patient "tears her stomach up", "Tears up stomach"   Tape Rash, Other (See Comments)   No "plastic" tape," please      Medication List    STOP taking these medications   clopidogrel 75 MG tablet Commonly known as:  PLAVIX   loperamide 2 MG tablet Commonly known as:  IMODIUM A-D     TAKE these medications   acetaminophen 500 MG tablet Commonly known as:  TYLENOL Take 1 tablet (500 mg total) by mouth every 6 (six) hours as needed for moderate pain or headache. What changed:  how much to take   albuterol 108 (90 Base) MCG/ACT inhaler Commonly known as:  PROVENTIL HFA;VENTOLIN HFA INHALE 2 PUFFS INTO THE LUNGS EVERY 6 (SIX) HOURS AS NEEDED FOR WHEEZING OR SHORTNESS OF BREATH. What changed:  See the new instructions.    azaTHIOprine 50 MG tablet Commonly known as:  IMURAN Take 125 mg by mouth every evening.   BASAGLAR KWIKPEN 100 UNIT/ML Sopn Inject 12 Units into the skin daily.   budesonide-formoterol 160-4.5 MCG/ACT inhaler Commonly known as:  SYMBICORT Inhale 2 puffs into the lungs 2 (two) times daily.   calcitRIOL 0.25 MCG capsule Commonly known as:  ROCALTROL Take 0.25 mcg by mouth every 3 (three) days.   carbamazepine 200 MG tablet Commonly known as:  TEGRETOL TAKE 200 MG BY MOUTH TWICE A DAY What changed:  See the new instructions.   clotrimazole 10 MG troche Commonly known as:  MYCELEX Take 1 tablet (10 mg total) by mouth 5 (five) times daily. What changed:    when to take this  reasons to take this   cyclobenzaprine 10 MG tablet Commonly known as:  FLEXERIL TAKE 1 TABLET BY MOUTH 3 TIMES DAILY AS NEEDED FOR MUSCLE SPASMS What changed:  See the new instructions.   diltiazem 120 MG 24 hr capsule Commonly known as:  CARDIZEM CD TAKE 1 CAPSULE (120 MG TOTAL) BY MOUTH DAILY. NEED OV. What changed:  additional instructions   diphenhydrAMINE 25 MG tablet Commonly known as:  BENADRYL Take 25 mg by mouth at bedtime. Takes with her Tegretol (carbamazepine) to prevent itching   dofetilide 250 MCG capsule Commonly known as:  TIKOSYN Take 1 capsule (250 mcg total) by mouth 2 (two) times daily.   DULoxetine 30 MG capsule Commonly known as:  CYMBALTA TAKE 2 CAPSULES (60 MG TOTAL) BY MOUTH EVERY MORNING.   famotidine 20 MG tablet Commonly known as:  PEPCID Take 20 mg by mouth daily.   ferrous sulfate 325 (65 FE) MG tablet Take 1 tablet (325 mg total) by mouth 3 (three) times daily with meals. What changed:  when to take this   fluticasone 50 MCG/ACT nasal spray Commonly known as:  FLONASE Place 2 sprays into both nostrils 2 (two) times daily.   glimepiride 4 MG tablet  Commonly known as:  AMARYL Take 4 mg by mouth every morning.   guaiFENesin 600 MG 12 hr tablet Commonly  known as:  MUCINEX Take 1 tablet (600 mg total) by mouth 2 (two) times daily. What changed:    when to take this  reasons to take this   LORazepam 1 MG tablet Commonly known as:  ATIVAN Take 1 mg by mouth every morning.   metFORMIN 500 MG tablet Commonly known as:  GLUCOPHAGE Take 500 mg by mouth 2 (two) times daily.   metoprolol tartrate 25 MG tablet Commonly known as:  LOPRESSOR Take 1 tablet (25 mg total) by mouth 2 (two) times daily.   montelukast 10 MG tablet Commonly known as:  SINGULAIR TAKE 1 TABLET BY MOUTH EVERY DAY   nitroGLYCERIN 0.4 MG SL tablet Commonly known as:  NITROSTAT Place 1 tablet (0.4 mg total) under the tongue every 5 (five) minutes as needed for chest pain.   NOVOLOG FLEXPEN 100 UNIT/ML FlexPen Generic drug:  insulin aspart Inject 4 Units into the skin 2 (two) times daily as needed for high blood sugar.   omeprazole 40 MG capsule Commonly known as:  PRILOSEC Take 40 mg by mouth every morning.   oxyCODONE 5 MG immediate release tablet Commonly known as:  Oxy IR/ROXICODONE Take 1 tablet (5 mg total) by mouth every 8 (eight) hours as needed for severe pain.   predniSONE 5 MG tablet Commonly known as:  DELTASONE Take 5 mg by mouth daily with breakfast.   promethazine 25 MG tablet Commonly known as:  PHENERGAN Take 1 tablet (25 mg total) by mouth every 6 (six) hours as needed for nausea or vomiting (or sleep).   rosuvastatin 20 MG tablet Commonly known as:  CRESTOR TAKE 1 TABLET BY MOUTH EVERY DAY   sertraline 100 MG tablet Commonly known as:  ZOLOFT Take 100 mg by mouth every morning.   warfarin 2.5 MG tablet Commonly known as:  COUMADIN Take 3 tablets (7.5mg ) on 9/10. Then take 2 tablets (5mg ) on 9/11. Then go for INR check on 9/12 as scheduled for further instructions.        Follow-up Information    [facility] Follow up on 05/02/2018.   Specialty:  Cardiology Why:  11:30AM Contact information: [address] [id number] [place] [phone]       Thompson Grayer, MD Follow up on 05/25/2018.   Specialty:  Cardiology Why:  1:45AM Contact information: [place] [address] [phone]        [facility] [place] [facility] Follow up on 04/28/2018.   Specialty:  Cardiology Why:  10:30AM, coumadin (warfarin) clinic visit Contact information: [address] [place] [facility]: 35 minutes  [facility] Pager [phone]  04/26/2018, 2:52 PM

## 2018-04-26 NOTE — Care Management Important Message (Signed)
Important Message  Patient Details  Name: Katherine [name] MRN: [id number] Date of Birth: 01-Jan-1956   Medicare Important Message Given:  Yes    Charels Stambaugh P Leyton Brownlee 04/26/2018, 2:55 PM

## 2018-04-26 NOTE — Progress Notes (Signed)
ANTICOAGULATION CONSULT NOTE - Follow Up Consult  Pharmacy Consult for Heparin/Coumadin Indication: Afib and ACS  Allergies  Allergen Reactions  . Tetracycline Hives  . Hydromorphone Hcl Nausea And Vomiting  . Niacin Other (See Comments)    Mouth blisters  . Niaspan [Niacin Er] Other (See Comments)    Mouth blisters  . Sulfa Antibiotics Nausea Only and Other (See Comments)    "Tears up stomach"  . Sulfonamide Derivatives Other (See Comments)    Reaction: per patient "tears her stomach up"  . Codeine Nausea And Vomiting  . Erythromycin Nausea And Vomiting  . Hydromorphone Hcl Nausea And Vomiting  . Morphine And Related Nausea And Vomiting  . Nalbuphine Nausea And Vomiting  . Sulfasalazine Nausea Only and Other (See Comments)    per patient "tears her stomach up", "Tears up stomach"  . Tape Rash and Other (See Comments)    No "plastic" tape," please    Labs: Recent Labs    04/24/18 0454 04/24/18 1152 04/25/18 0543 04/26/18 0243  HGB 10.0*  --  10.2*  --   HCT 31.0*  --  31.8*  --   PLT 168  --  169  --   LABPROT 16.9*  --  20.3* 23.3*  INR 1.39  --  1.75 2.08  HEPARINUNFRC  --  0.45 0.31 <0.10*  CREATININE 0.90  --  0.87 1.09*   Estimated Creatinine Clearance: 61.2 mL/min (A) (by C-G formula based on SCr of 1.09 mg/dL (H)).  Assessment: 62 yo female with afib and on heparin (CHADSVASC=6). Adding coumadin (unable to use a DOAC with carbamazepine).  Heparin stopped due to loss of IV access INR = 2.06  Goal of Therapy:  Heparin level 0.3-0.7 units/ml Monitor platelets by anticoagulation protocol: Yes    Plan:  DC heparin (INR therapeutic) Warfarin 7.5 mg po x 1 at 1800 pm Daily INR  Thanks for allowing pharmacy to be a part of this patient's care. Anette Guarneri, PharmD [phone]  04/26/2018 9:24 AM Please check AMION for all [facility] numbers

## 2018-04-26 NOTE — Progress Notes (Signed)
Loss of IV access earlier this shift due to malposition.  IV team consulted and have been unable to establish new peripheral IV access.  [facility] paged with this information.

## 2018-04-26 NOTE — Progress Notes (Signed)
Inpatient Diabetes Program Recommendations  AACE/ADA: New Consensus Statement on Inpatient Glycemic Control (2015)  Target Ranges:  Prepandial:   less than 140 mg/dL      Peak postprandial:   less than 180 mg/dL (1-2 hours)      Critically ill patients:  140 - 180 mg/dL   Lab Results  Component Value Date   GLUCAP 257 (H) 04/26/2018   HGBA1C 7.1 (H) 01/28/2018    Review of Glycemic Control Results for Katherine [name], Katherine [name] (MRN [id number]) as of 04/26/2018 12:55  Ref. Range 04/25/2018 15:53 04/25/2018 21:29 04/26/2018 07:21 04/26/2018 11:20  Glucose-Capillary Latest Ref Range: 70 - 99 mg/dL 276 (H) 241 (H) 189 (H) 257 (H)   Diabetes history: Type 2 DM Outpatient Diabetes medications: Novolog 4 units BID, Amaryl 4 mg QAM, Basaglar 12 units QD, Metformin 500 mg BID Current orders for Inpatient glycemic control: Novolog 0-9 units TID, Lantus 8 units QHS, Prednisone 5 mg QAM  Inpatient Diabetes Program Recommendations:    Consider adding Novolog 4 units TID (assuming that patient is consuming >50% of meal).   Thanks, Bronson Curb, MSN, RNC-OB Diabetes Coordinator [phone] (8a-5p)

## 2018-04-26 NOTE — Telephone Encounter (Signed)
Katherine [name] hasn't been seen in our office since June 2019, she has to be seen in the office, prior to prescribing her medication.

## 2018-04-26 NOTE — Progress Notes (Signed)
PT Cancellation Note  Patient Details Name: Katherine [name] MRN: [id number] DOB: Dec 15, 1955   Cancelled Treatment:    Reason Eval/Treat Not Completed: Other (comment). Pt with a bite of breakfast left and unsure when she would be done eating. Requesting PT come back for treatment session. Will follow-up as schedule permits.  Mabeline Caras, PT, DPT Acute Rehabilitation Services  Pager [phone] Office [place] 04/26/2018, 9:20 AM

## 2018-04-27 ENCOUNTER — Telehealth: Payer: Self-pay | Admitting: *Deleted

## 2018-04-27 ENCOUNTER — Other Ambulatory Visit: Payer: Self-pay | Admitting: *Deleted

## 2018-04-27 NOTE — Telephone Encounter (Signed)
Transition Care Management Follow-up Telephone Call   Date discharged? 04/26/18   How have you been since you were released from the hospital? Pt states he is feeling fine   Do you understand why you were in the hospital? YES   Do you understand the discharge instructions? YES   Where were you discharged to? Home   Items Reviewed:  Medications reviewed: YES, pt is no linger taking plavix  Allergies reviewed: YES  Dietary changes reviewed: YES, carb modified  Referrals reviewed: YES, will be seeing cardiology tomorrow for coumadin check   Functional Questionnaire:   Activities of Daily Living (ADLs):   She states she are independent in the following: ambulation, bathing and hygiene, feeding, continence, grooming, toileting and dressing States she doesn't require assistance    Any transportation issues/concerns?: NO   Any patient concerns? NO   Confirmed importance and date/time of follow-up visits scheduled YES, appt 05/02/18  Provider Appointment booked with Dr. Ronnald Ramp  Confirmed with patient if condition begins to worsen call PCP or go to the ER.  Patient was given the office number and encouraged to call back with question or concerns.  : YES

## 2018-04-27 NOTE — Patient Outreach (Signed)
[name]) Care Management  04/27/2018  Katherine [name] 1956-01-28 [id number]   Referral received from hospital liaison as member was recently admitted to hospital with complaints of chest pain, complications of atrial fibrillation.  She was discharged on 9/10.  Primary MD office will complete transition of care assessment.  Per chart, she also has history of hypertension, diabetes, PAD, GERD, and CABG.    Call placed to member, identity verified.  This care manager introduces self and purpose of call.  [facility] care management services explained.  She report she is doing well, lives with her spouse.  Independent with ADLs, still able to drive and provide her own transportation to MD appointments.  She has appointments scheduled with A-fib clinic, coumadin clinic, and primary MD within the next week.  A-fib is not new for her, verbalizes understanding of signs/symptoms.  Denies any urgent concerns, agrees to home visit within the next 2 weeks.  THN CM Care Plan Problem One     Most Recent Value  Care Plan Problem One  Risk for readmission related to a-fib as evidenced by recent hospitalization  Role Documenting the Problem One  Care Management [place] for Problem One  Active  [facility] Long Term Goal   Member will not be readmitted to hospital within 31 days of admission  [facility] Long Term Goal Start Date  04/27/18  Interventions for Problem One Long Term Goal  Discussed with member the importance of following discharge instructions, including follow up appointments, medications, diet, to decrease the risk of readmission  THN CM Short Term Goal #1   Member will keep and attend all follow up appointments within the next 2 weeks  THN CM Short Term Goal #1 Start Date  04/27/18  Interventions for Short Term Goal #1  Upcoming appointments reviewed, advised of importance of attending.  Confirmed she has transportation to appointments  [facility] CM Short Term Goal #2   Member will  report compliance with medications as instructed over the next 4 weeks  THN CM Short Term Goal #2 Start Date  04/27/18  Interventions for Short Term Goal #2  Medication list reviewed according to discharge instructions.  Pharmacy needs assessed.     Valente David, [place], MSN [place] [phone]

## 2018-04-27 NOTE — Telephone Encounter (Signed)
Patient has made an appointment with Dr. Naaman Plummer on 05/03/2018 at 10 am

## 2018-04-28 ENCOUNTER — Ambulatory Visit (INDEPENDENT_AMBULATORY_CARE_PROVIDER_SITE_OTHER): Payer: Medicare Other

## 2018-04-28 DIAGNOSIS — I480 Paroxysmal atrial fibrillation: Secondary | ICD-10-CM | POA: Diagnosis not present

## 2018-04-28 DIAGNOSIS — Z7902 Long term (current) use of antithrombotics/antiplatelets: Secondary | ICD-10-CM | POA: Insufficient documentation

## 2018-04-28 DIAGNOSIS — Z7901 Long term (current) use of anticoagulants: Secondary | ICD-10-CM | POA: Insufficient documentation

## 2018-04-28 LAB — POCT INR: INR: 1.6 — AB (ref 2.0–3.0)

## 2018-04-28 NOTE — Patient Instructions (Addendum)
A full discussion of the nature of anticoagulants has been carried out.  A benefit risk analysis has been presented to the patient, so that they understand the justification for choosing anticoagulation at this time. The need for frequent and regular monitoring, precise dosage adjustment and compliance is stressed.  Side effects of potential bleeding are discussed.  The patient should avoid any OTC items containing aspirin or ibuprofen, and should avoid great swings in general diet.  Avoid alcohol consumption.  Call if any signs of abnormal bleeding.  Description   Start taking 4 tablets daily except 3 tablets on Mondays, Wednesdays and Fridays.  Recheck on Tuesday.  [facility] [phone] call with procedures or new medications.

## 2018-05-02 ENCOUNTER — Inpatient Hospital Stay: Payer: Medicare Other | Admitting: Internal Medicine

## 2018-05-02 ENCOUNTER — Ambulatory Visit (HOSPITAL_COMMUNITY): Payer: Medicare Other | Admitting: Nurse Practitioner

## 2018-05-03 ENCOUNTER — Ambulatory Visit (INDEPENDENT_AMBULATORY_CARE_PROVIDER_SITE_OTHER): Payer: Medicare Other

## 2018-05-03 ENCOUNTER — Encounter: Payer: Self-pay | Admitting: Physical Medicine & Rehabilitation

## 2018-05-03 ENCOUNTER — Encounter: Payer: Medicare Other | Attending: Physical Medicine & Rehabilitation | Admitting: Physical Medicine & Rehabilitation

## 2018-05-03 DIAGNOSIS — Z7901 Long term (current) use of anticoagulants: Secondary | ICD-10-CM

## 2018-05-03 DIAGNOSIS — Z9851 Tubal ligation status: Secondary | ICD-10-CM | POA: Insufficient documentation

## 2018-05-03 DIAGNOSIS — Z5181 Encounter for therapeutic drug level monitoring: Secondary | ICD-10-CM

## 2018-05-03 DIAGNOSIS — Z9861 Coronary angioplasty status: Secondary | ICD-10-CM

## 2018-05-03 DIAGNOSIS — E785 Hyperlipidemia, unspecified: Secondary | ICD-10-CM | POA: Diagnosis not present

## 2018-05-03 DIAGNOSIS — Z951 Presence of aortocoronary bypass graft: Secondary | ICD-10-CM | POA: Diagnosis not present

## 2018-05-03 DIAGNOSIS — I120 Hypertensive chronic kidney disease with stage 5 chronic kidney disease or end stage renal disease: Secondary | ICD-10-CM | POA: Diagnosis not present

## 2018-05-03 DIAGNOSIS — M069 Rheumatoid arthritis, unspecified: Secondary | ICD-10-CM | POA: Diagnosis not present

## 2018-05-03 DIAGNOSIS — Z6841 Body Mass Index (BMI) 40.0 and over, adult: Secondary | ICD-10-CM | POA: Diagnosis not present

## 2018-05-03 DIAGNOSIS — Z87891 Personal history of nicotine dependence: Secondary | ICD-10-CM | POA: Diagnosis not present

## 2018-05-03 DIAGNOSIS — Z8673 Personal history of transient ischemic attack (TIA), and cerebral infarction without residual deficits: Secondary | ICD-10-CM | POA: Diagnosis not present

## 2018-05-03 DIAGNOSIS — I480 Paroxysmal atrial fibrillation: Secondary | ICD-10-CM | POA: Diagnosis not present

## 2018-05-03 DIAGNOSIS — G4733 Obstructive sleep apnea (adult) (pediatric): Secondary | ICD-10-CM | POA: Insufficient documentation

## 2018-05-03 DIAGNOSIS — F418 Other specified anxiety disorders: Secondary | ICD-10-CM | POA: Diagnosis not present

## 2018-05-03 DIAGNOSIS — M545 Low back pain: Secondary | ICD-10-CM | POA: Insufficient documentation

## 2018-05-03 DIAGNOSIS — M79604 Pain in right leg: Secondary | ICD-10-CM | POA: Insufficient documentation

## 2018-05-03 DIAGNOSIS — Z955 Presence of coronary angioplasty implant and graft: Secondary | ICD-10-CM | POA: Insufficient documentation

## 2018-05-03 DIAGNOSIS — M797 Fibromyalgia: Secondary | ICD-10-CM | POA: Insufficient documentation

## 2018-05-03 DIAGNOSIS — E669 Obesity, unspecified: Secondary | ICD-10-CM | POA: Insufficient documentation

## 2018-05-03 DIAGNOSIS — J449 Chronic obstructive pulmonary disease, unspecified: Secondary | ICD-10-CM | POA: Diagnosis not present

## 2018-05-03 DIAGNOSIS — M7591 Shoulder lesion, unspecified, right shoulder: Secondary | ICD-10-CM | POA: Insufficient documentation

## 2018-05-03 DIAGNOSIS — M47816 Spondylosis without myelopathy or radiculopathy, lumbar region: Secondary | ICD-10-CM

## 2018-05-03 DIAGNOSIS — E1151 Type 2 diabetes mellitus with diabetic peripheral angiopathy without gangrene: Secondary | ICD-10-CM | POA: Diagnosis not present

## 2018-05-03 DIAGNOSIS — M79605 Pain in left leg: Secondary | ICD-10-CM | POA: Diagnosis not present

## 2018-05-03 DIAGNOSIS — I2510 Atherosclerotic heart disease of native coronary artery without angina pectoris: Secondary | ICD-10-CM | POA: Diagnosis not present

## 2018-05-03 DIAGNOSIS — E1122 Type 2 diabetes mellitus with diabetic chronic kidney disease: Secondary | ICD-10-CM | POA: Insufficient documentation

## 2018-05-03 DIAGNOSIS — Z940 Kidney transplant status: Secondary | ICD-10-CM | POA: Insufficient documentation

## 2018-05-03 DIAGNOSIS — M170 Bilateral primary osteoarthritis of knee: Secondary | ICD-10-CM | POA: Insufficient documentation

## 2018-05-03 DIAGNOSIS — Z9049 Acquired absence of other specified parts of digestive tract: Secondary | ICD-10-CM | POA: Diagnosis not present

## 2018-05-03 LAB — POCT INR: INR: 2.2 (ref 2.0–3.0)

## 2018-05-03 MED ORDER — OXYCODONE HCL 5 MG PO TABS: 5.0000 mg | ORAL_TABLET | Freq: Three times a day (TID) | ORAL | 0 refills | Status: DC | PRN

## 2018-05-03 NOTE — Patient Instructions (Addendum)
Please continue taking 4 tablets daily except 3 tablets on Mondays, Wednesdays and Fridays.  Recheck in 1 week. [facility] [phone] call with procedures or new medications.

## 2018-05-03 NOTE — Progress Notes (Signed)
Subjective:    Patient ID: Katherine [name], female    DOB: July 18, 1956, 62 y.o.   MRN: [id number]  HPI   This is a follow-up office note for Katherine [name].  I last saw her in February and she last saw our nurse practitioner in June of this year.  In reviewing her chart she was just discharged on September 10 with a diagnosis of paroxysmal atrial fibrillation. 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 She has had a wound vac and skin graft surgery this summer along with other complications. Her husband is also dealing with medical issues of his own.    She has continued pain in her low back as well as her legs and now right thumb. She was put on a table for her heart cath which seemed to stoke her low back and "sciatic" pain. I reviewed her most recent MRI from 08/2017:    L4-L5: 2 mm anterolisthesis. Facet arthropathy. Small leftward foraminal protrusion. See sagittal image 12, and axial image 34. The LEFT L4 nerve root is contacted, and may be displaced. There does not appear to be significant foraminal narrowing but the nerve could be additionally impacted in the extraforaminal compartment. Correlate clinically for LEFT L4 nerve root irritation.  L5-S1:  Facet arthropathy.  Annular bulge.  No impingement.    Pain Inventory Average Pain 7 Pain Right Now 7 My pain is sharp, burning, stabbing, tingling and aching  In the last 24 hours, has pain interfered with the following? General activity 7 Relation with others 7 Enjoyment of life 9 What TIME of day is your pain at its worst? night Sleep (in general) Fair  Pain is worse with: walking, bending, standing and some activites Pain improves with: rest, heat/ice, medication and injections Relief from Meds: 9  Mobility use a cane use a walker ability to climb steps?  yes do you drive?  yes  Function disabled: date disabled 1989  Neuro/Psych bowel control  problems weakness numbness tingling trouble walking spasms depression anxiety  Prior Studies Any changes since last visit?  no  Physicians involved in your care Any changes since last visit?  no   Family History  Problem Relation Age of Onset  . Cancer Mother        liver  . Heart disease Father   . Cancer Father        colon  . Arrhythmia Brother        Atrial Fibrillation  . Arrhythmia Paternal Aunt        Atrial Fibrillation   Social History   Socioeconomic History  . Marital status: Married    Spouse name: Not on file  . Number of children: Not on file  . Years of education: Not on file  . Highest education level: Not on file  Occupational History  . Not on file  Social Needs  . Financial resource strain: Not on file  . Food insecurity:    Worry: Not on file    Inability: Not on file  . Transportation needs:    Medical: Not on file    Non-medical: Not on file  Tobacco Use  . Smoking status: Former Smoker    Packs/day: 1.00    Years: 30.00    Pack years: 30.00    Types: Cigarettes    Last attempt to quit: 08/17/2002    Years since quitting: 15.7  . Smokeless tobacco: Never Used  Substance and Sexual Activity  . Alcohol use: No  . Drug use: No  . Sexual activity: Not on file  Lifestyle  . Physical activity:    Days per week: Not on file    Minutes per session: Not on file  . Stress: Not on file  Relationships  . Social connections:    Talks on phone: Not on file    Gets together: Not on file    Attends religious service: Not on file    Active member of club or organization: Not on file    Attends meetings of clubs or organizations: Not on file    Relationship status: Not on file  Other Topics Concern  . Not on file  Social History Narrative   She is currently married, and the caregiver of her husband who is recovering from surgery for tongue cancer now diagnosed with lung cancer. Prior to his diagnosis of her husband, she actually had adopted  a 75-year-old child who she knows caring for as well. With all the surgeries, they have been quite financially troubled. Thanks the help of her community and church, they have been able to stay "alfoat."     She is a former smoker who quit in 2004 after a 30-pack-year history.   She is active chasing a 54-year-old child, does not do routine exercise. She's been quite depressed with the condition of her husband, and admits to eating comfort herself.   She does not drink alcohol.   Past Surgical History:  Procedure Laterality Date  . ABDOMINAL AORTOGRAM N/A 04/21/2018   Procedure: ABDOMINAL AORTOGRAM;  Surgeon: [name], MD;  Location: [facility];  Service: Cardiovascular;  Laterality: N/A;  . CARDIAC CATHETERIZATION  5/'01, 3/'02, 8/'03, 10/'04; 1/'15   08/22/2013: LAD & RCA 100%; LIMA-LAD & SVG-rPDA patent; Cx-- OM1 60%, OM2 ostial ~50%; SVG-D1 - 80% mid, 50% distal ISR --PCI  . CATHETER REMOVAL    . CHOLECYSTECTOMY N/A 10/29/2014   Procedure: LAPAROSCOPIC CHOLECYSTECTOMY WITH INTRAOPERATIVE CHOLANGIOGRAM;  Surgeon: Excell Seltzer, MD;  Location: [facility];  Service: General;  Laterality: N/A;  . CORONARY ANGIOPLASTY  1994   x5  . CORONARY ARTERY BYPASS GRAFT  1995   LIMA-LAD, SVG-RPDA, SVG-D1  . ESOPHAGOGASTRODUODENOSCOPY N/A 10/15/2016   Procedure: ESOPHAGOGASTRODUODENOSCOPY (EGD);  Surgeon: Wilford Corner, MD;  [facility];  Service: Endoscopy;  Laterality: N/A;  . I&D EXTREMITY Right 01/29/2018   Procedure: IRRIGATION AND DEBRIDEMENT THUMB;  Surgeon: Dayna Barker, MD;  Location: [facility];  Service: Plastics;  Laterality: Right;  . INCISE AND DRAIN ABCESS    . KIDNEY TRANSPLANT  1991  . KNEE ARTHROSCOPY WITH LATERAL MENISECTOMY Left 12/03/2017   Procedure: LEFT KNEE ARTHROSCOPY WITH LATERAL MENISECTOMY;  Surgeon: Earlie Server, MD;  Location: [facility];  Service: Orthopedics;  Laterality: Left;  . LAPAROSCOPIC GASTRIC BANDING  04/2004; 10/'09, 2/'10   Port Replacement x 2   . LEFT HEART CATH AND CORS/GRAFTS ANGIOGRAPHY N/A 04/21/2018   Procedure: LEFT HEART CATH AND CORS/GRAFTS ANGIOGRAPHY;  Surgeon: [name], MD;  Location: [place] [facility];  Service: Cardiovascular;  Laterality: N/A;  . LEFT HEART CATHETERIZATION WITH CORONARY/GRAFT ANGIOGRAM N/A 08/23/2013   Procedure: LEFT HEART CATHETERIZATION WITH Beatrix Fetters;  Surgeon: Wellington Hampshire, MD;  Location: [place] [facility];  Service: Cardiovascular;  Laterality: N/A;  . Lower Extremity Arterial Dopplers  08/2013   ABI: R 0.96, L 1.04  . MULTIPLE TOOTH EXTRACTIONS  age 76  . NM MYOVIEW LTD  03/2016   EF 62%. LOW RISK. C/W prior MI - no Ischemia. Apical hypokinesis.  [name] Kitchen PERCUTANEOUS CORONARY STENT INTERVENTION (PCI-S)  5/'01, 3/'02, 8/'03, 10/'04;   '01 - S660 BMS 2.5 x 9 - dSVG-D1 into D1; '02- post-stent stenosis - 2.5 x 8 Pixel BMS; '8\03: ISR/Thrombosis into native D1 - AngioJet, 2.5 x 13 Pixel; '04 - ISR 95% - covered stented area with Taxus DES 2.5 mm x 20 (2.88)  . PERCUTANEOUS CORONARY STENT INTERVENTION (PCI-S)  08/23/2013   Procedure: PERCUTANEOUS CORONARY STENT INTERVENTION (PCI-S);  Surgeon: Wellington Hampshire, MD;  [facility] C[facility] LAB;  Service: Cardiovascular;;mid SVG-D1 80%; distal stent ~50% ISR; Promus Prermier DES 2.75 mm xc 20 mm (2.8 mm)  . PORT-A-CATH REMOVAL     kidney  . TRANSTHORACIC ECHOCARDIOGRAM  02/2016   EF 55-60%. Septal dyssynergy from CABG GR 2 DD. Aortic valve is trileaflet the functional bicuspid with sclerosis but not yet notable for stenosis stenosis.; no Significant change  . TUBAL LIGATION    . wrist fistula repair Left    dialysis for one year   Past Medical History:  Diagnosis Date  . Anemia   . Anxiety   . Bilateral carotid artery stenosis    Carotid duplex 04/1477: 2-95% LICA, 62-13% RICA, >08% RECA, f/u 1 yr suggested  . CAD (coronary artery disease) of bypass graft 5/01; 3/'02, 8/'03, 10/'04; 1/15   PCI x 5 to SVG-D1   . CAD in native artery 07/1993     3 Vessel Disease (LAD-D1 & RCA) -- CABG  . CAD S/P percutaneous coronary angioplasty    PCI to SVG-D1 insertion/native D1 x 4 = '01 -(S660 BMS 2.5 x 9 - insertion into D1; '02 - distal overlap ACS Pixel 2.5 x 8  BMS; '03 distal/native ISR/Thrombosis - Pixel 2.5 x 13; '04 - ISR-  Taxus 2.5 x 20 (covered all);; 1/15 - mid SVG-D1 (50% distal ISR) - Promus P 2.75 x 20 -- 2.8 mm  . COPD mixed type ([place])    Followed by Dr. Lamonte Sakai "pulmonologist said no COPD"  . Depression   . Depression with anxiety   . Diabetes mellitus type 2 in obese ([place])   . Diarrhea    started after cholecystectomy and mass removed from intestine  . Dyslipidemia, goal LDL below 70    08/2012: TC 137, TG 200, HDL 32!, LDL 45; on statin (followed by Dr.Deterding)  . ESRD (end stage renal disease) ([place]) 1991   s/p Cadaveric Renal Transplant [facility] - Dr. Jimmy Footman)   . Family history of adverse reaction to anesthesia    mom's bp dropped during/after anesthesia  . Fibromyalgia   . GERD (gastroesophageal reflux disease)   . Glomerulonephritis, chronic, rapidly progressive 52  . 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 07/1993   Rescue PTCA of RCA -- referred for CABG.  . H/O: GI bleed   . Headache    migraines in the past  . History of kidney stones   . Hypertension associated with diabetes ([place])   . MRSA (methicillin resistant staph aureus) culture positive   . Obesity    s/p Lap Band 04/2004- Port Replacement 10/09 & 2/10 for infection; Dr. Excell Seltzer.  . OSA (obstructive sleep apnea)    no longer on CPAP or home O2, states she doesn't need now after lap band  . PAD (peripheral artery disease) ([place]) 08/2013   LEA Dopplers to be read by Dr. Fletcher Anon  . PAF (paroxysmal atrial fibrillation) ([place])  06/2014   Noted on CardioNet Monitor  - rate ~112; no recurrent symptoms. Nonionic regulation because of frequent GI procedures. Patient prefers Plavix  . Pneumonia   . Recurrent boils    Bilateral Groin    . Rheumatoid arthritis ([place])    Per Patient Report; associated with OA  . 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 Servando Snare: LIMA-LAD, SVG-bifurcatingD1, SVG-rPDA  . S/p cadaver renal transplant [place]  . Stroke [facility]) 2012   "right eye stroke- half blind now"  . Unstable angina ([place]) 5/01; 3/'02, 8/'03, 10/'04; 1/15   x 5 occurences since Inf-Post STEMI in 1994   There were no vitals taken for this visit.  Opioid Risk Score:   Fall Risk Score:  `1  Depression screen PHQ 2/9  Depression screen [facility] 2/9 03/29/2017 05/19/2016 03/30/2016  Decreased Interest 3 3 3   Down, Depressed, Hopeless 3 3 3   PHQ - 2 Score 6 6 6   Altered sleeping - - 3  Tired, decreased energy - - 3  Change in appetite - - 2  Feeling bad or failure about yourself  - - 3  Trouble concentrating - - 2  Moving slowly or fidgety/restless - - 0  Suicidal thoughts - - 0  PHQ-9 Score - - 19  Some recent data might be hidden     Review of Systems  Constitutional: Positive for unexpected weight change.  HENT: Negative.   Eyes: Negative.   Respiratory: Positive for shortness of breath.   Cardiovascular: Negative.   Gastrointestinal: Positive for abdominal pain and diarrhea.  Endocrine: Negative.   Genitourinary: Negative.   Musculoskeletal: Positive for arthralgias, gait problem, joint swelling and myalgias.  Skin: Negative.   Allergic/Immunologic: Negative.   Neurological: Positive for weakness and numbness.  Hematological: Negative.   Psychiatric/Behavioral: Positive for dysphoric mood. The patient is nervous/anxious.   All other systems reviewed and are negative.      Objective:   Physical Exam General: No acute distress HEENT: EOMI, oral membranes moist Cards: reg rate  Chest: normal effort Abdomen: Soft, NT, ND Skin: dry, intact, thumb dressed Extremities: no edema   Neuro:CN normal. Motor 5/5. No sensory loss Musculoskeletal:antalgic with gait. Low back rom limited. LB TTP, right hand dressed,  difficulty with grip Psych:pleasant       Assessment & Plan:  1. Fibromyalgia 2. Rheumatoid Arthritis by history 3. Osteoarthritis--polyarticular, bilateral knees quite involved, left more than right 4. ESRD with hx of renal transplant on chronic steroids 5. Right rotator cuff tendonitis/like degenerative/inflammatory changes in the right shoulder also.  6. Hx o falls,gait disorder 7. Right low back pain with radicular symptoms. ?spondylosis per recent plain film 8. SCC of right thumb   Plan: 1. Continues to be a fall riskdue to her balance, pain.  2. oxycodone 5mg  q8 prn, #60-- Second rx was provided for October -We will continue the controlled substance monitoring program, this consists of regular clinic visits, examinations, routine drug screening, pill counts as well as use of [place] Controlled Substance Reporting System. [place].   3. Continuecymbalta to 60mg  daily,continuezoloftat100mg as well    4. Continue tegretol200mg  BID for FMS/neuropathic pain 6.Left knee mgt.   per Dr. Gillermina Hu  8.Reviewed MRI again of lumbar spine.  She would like to consider injections to her back but given the recent issues with her atrial fibrillation and the fact that she is on Coumadin, I do not see that that will be an option anytime soon.  She may want to discuss with her cardiologist the potential down the road for temporarily coming off her medication.  Right now it appears too high risk.  9. Hand care per plastics    80minutes of face to face patient care time were spent during this visit. All questions were encouraged and answered.  Follow up in 2 months.

## 2018-05-03 NOTE — Patient Instructions (Signed)
PLEASE FEEL FREE TO CALL OUR OFFICE WITH ANY PROBLEMS OR QUESTIONS ([phone])      

## 2018-05-06 ENCOUNTER — Encounter (HOSPITAL_COMMUNITY): Payer: Self-pay | Admitting: Nurse Practitioner

## 2018-05-06 ENCOUNTER — Ambulatory Visit (HOSPITAL_COMMUNITY)
Admission: RE | Admit: 2018-05-06 | Discharge: 2018-05-06 | Disposition: A | Discharge status: Home / Self Care | Payer: Medicare Other | Source: Ambulatory Visit | Attending: Nurse Practitioner | Admitting: Nurse Practitioner

## 2018-05-06 VITALS — BP 112/60 | HR 55 | Ht 61.0 in | Wt 234.6 lb

## 2018-05-06 DIAGNOSIS — E669 Obesity, unspecified: Secondary | ICD-10-CM | POA: Diagnosis not present

## 2018-05-06 DIAGNOSIS — I480 Paroxysmal atrial fibrillation: Secondary | ICD-10-CM | POA: Insufficient documentation

## 2018-05-06 DIAGNOSIS — Z79899 Other long term (current) drug therapy: Secondary | ICD-10-CM | POA: Diagnosis not present

## 2018-05-06 DIAGNOSIS — M069 Rheumatoid arthritis, unspecified: Secondary | ICD-10-CM | POA: Diagnosis not present

## 2018-05-06 DIAGNOSIS — Z951 Presence of aortocoronary bypass graft: Secondary | ICD-10-CM | POA: Insufficient documentation

## 2018-05-06 DIAGNOSIS — M797 Fibromyalgia: Secondary | ICD-10-CM | POA: Diagnosis not present

## 2018-05-06 DIAGNOSIS — F418 Other specified anxiety disorders: Secondary | ICD-10-CM | POA: Diagnosis not present

## 2018-05-06 DIAGNOSIS — Z7901 Long term (current) use of anticoagulants: Secondary | ICD-10-CM | POA: Insufficient documentation

## 2018-05-06 DIAGNOSIS — J449 Chronic obstructive pulmonary disease, unspecified: Secondary | ICD-10-CM | POA: Diagnosis not present

## 2018-05-06 DIAGNOSIS — E785 Hyperlipidemia, unspecified: Secondary | ICD-10-CM | POA: Diagnosis not present

## 2018-05-06 DIAGNOSIS — R001 Bradycardia, unspecified: Secondary | ICD-10-CM | POA: Insufficient documentation

## 2018-05-06 DIAGNOSIS — Z79891 Long term (current) use of opiate analgesic: Secondary | ICD-10-CM | POA: Diagnosis not present

## 2018-05-06 DIAGNOSIS — Z881 Allergy status to other antibiotic agents status: Secondary | ICD-10-CM | POA: Insufficient documentation

## 2018-05-06 DIAGNOSIS — Z888 Allergy status to other drugs, medicaments and biological substances status: Secondary | ICD-10-CM | POA: Insufficient documentation

## 2018-05-06 DIAGNOSIS — K219 Gastro-esophageal reflux disease without esophagitis: Secondary | ICD-10-CM | POA: Insufficient documentation

## 2018-05-06 DIAGNOSIS — Z8249 Family history of ischemic heart disease and other diseases of the circulatory system: Secondary | ICD-10-CM | POA: Insufficient documentation

## 2018-05-06 DIAGNOSIS — Z6841 Body Mass Index (BMI) 40.0 and over, adult: Secondary | ICD-10-CM | POA: Insufficient documentation

## 2018-05-06 DIAGNOSIS — R9431 Abnormal electrocardiogram [ECG] [EKG]: Secondary | ICD-10-CM | POA: Insufficient documentation

## 2018-05-06 DIAGNOSIS — Z955 Presence of coronary angioplasty implant and graft: Secondary | ICD-10-CM | POA: Diagnosis not present

## 2018-05-06 DIAGNOSIS — Z940 Kidney transplant status: Secondary | ICD-10-CM | POA: Insufficient documentation

## 2018-05-06 DIAGNOSIS — Z885 Allergy status to narcotic agent status: Secondary | ICD-10-CM | POA: Insufficient documentation

## 2018-05-06 DIAGNOSIS — E1151 Type 2 diabetes mellitus with diabetic peripheral angiopathy without gangrene: Secondary | ICD-10-CM | POA: Insufficient documentation

## 2018-05-06 DIAGNOSIS — G4733 Obstructive sleep apnea (adult) (pediatric): Secondary | ICD-10-CM | POA: Insufficient documentation

## 2018-05-06 DIAGNOSIS — E1122 Type 2 diabetes mellitus with diabetic chronic kidney disease: Secondary | ICD-10-CM | POA: Diagnosis not present

## 2018-05-06 DIAGNOSIS — Z8673 Personal history of transient ischemic attack (TIA), and cerebral infarction without residual deficits: Secondary | ICD-10-CM | POA: Diagnosis not present

## 2018-05-06 DIAGNOSIS — I252 Old myocardial infarction: Secondary | ICD-10-CM | POA: Diagnosis not present

## 2018-05-06 DIAGNOSIS — Z882 Allergy status to sulfonamides status: Secondary | ICD-10-CM | POA: Insufficient documentation

## 2018-05-06 DIAGNOSIS — Z794 Long term (current) use of insulin: Secondary | ICD-10-CM | POA: Diagnosis not present

## 2018-05-06 DIAGNOSIS — Z9049 Acquired absence of other specified parts of digestive tract: Secondary | ICD-10-CM | POA: Diagnosis not present

## 2018-05-06 DIAGNOSIS — Z800 Family history of malignant neoplasm of digestive organs: Secondary | ICD-10-CM | POA: Insufficient documentation

## 2018-05-06 DIAGNOSIS — I2510 Atherosclerotic heart disease of native coronary artery without angina pectoris: Secondary | ICD-10-CM | POA: Insufficient documentation

## 2018-05-06 DIAGNOSIS — Z87891 Personal history of nicotine dependence: Secondary | ICD-10-CM | POA: Insufficient documentation

## 2018-05-06 LAB — BASIC METABOLIC PANEL
Anion gap: 9 (ref 5–15)
BUN: 13 mg/dL (ref 6–20)
CO2: 24 mmol/L (ref 22–32)
CREATININE: 0.98 mg/dL (ref 0.44–1.00)
Calcium: 9.3 mg/dL (ref 8.9–10.3)
Chloride: 103 mmol/L (ref 98–111)
GFR calc Af Amer: >60 mL/min (ref >60)
GLUCOSE: 196 mg/dL — AB (ref 70–99)
Potassium: 5.3 mmol/L — ABNORMAL HIGH (ref 3.5–5.1)
SODIUM: 136 mmol/L (ref 135–145)

## 2018-05-06 LAB — MAGNESIUM: MAGNESIUM: 1.9 mg/dL (ref 1.7–2.4)

## 2018-05-09 ENCOUNTER — Ambulatory Visit: Payer: Self-pay | Admitting: *Deleted

## 2018-05-09 ENCOUNTER — Other Ambulatory Visit: Payer: Self-pay | Admitting: *Deleted

## 2018-05-09 NOTE — Patient Outreach (Signed)
[facility]) Care Management  05/09/2018  Katherine [name] April 26, 1956 [id number]   Call placed to member to confirm she would be available for home visit this afternoon.  She report she is not feeling well.  State she may have a virus, complains of headache and stomachache.  Denies needing medical attention, state she will rest.  Will follow up within the next week to reschedule home visit and perform initial assessment.  Valente David, [place], MSN [place] [phone]

## 2018-05-09 NOTE — Progress Notes (Signed)
Primary Care Physician: [name], MD Referring Physician:[facility] f/u EP: Dr. Charmaine Downs Katherine [name] is a 62 y.o. female with a h/o coronary artery disease status post CABG and subsequent stents, depression with anxiety, chronic pain, renal transplant recipient, paroxysmal atrial fibrillation not on anticoagulation, and insulin-dependent diabetes mellitus, presented to the emergency department with chest pain. Patient reports that she was in her usual state of health and having an uneventful day when she developed sudden onset of rapid palpitations while at rest. This was followed by discomfort across the anterior chest, radiating up into the bilateral jaw, and associated with nausea and diaphoresis. Patient was seen in the emergency department. She was given a dose of intravenous Cardizem with improvement in heart rate. She was hospitalized for further management. Patient seen by cardiology and electrophysiology. Started on Tikosyn load. She also underwent cardiac catheterization which showed stable disease.  F/u in afib clinic, 9/23, for f/u Tikosyn load. She remains in S brady at 55 bpm, qtc stable 461 ms.  Today, she denies symptoms of palpitations, chest pain, shortness of breath, orthopnea, PND, lower extremity edema, dizziness, presyncope, syncope, or neurologic sequela. The patient is tolerating medications without difficulties and is otherwise without complaint today.   Past Medical History:  Diagnosis Date  . Anemia   . Anxiety   . Bilateral carotid artery stenosis    Carotid duplex 10/6627: 4-76% LICA, 54-65% RICA, >03% RECA, f/u 1 yr suggested  . CAD (coronary artery disease) of bypass graft 5/01; 3/'02, 8/'03, 10/'04; 1/15   PCI x 5 to SVG-D1   . CAD in native artery 07/1993    3 Vessel Disease (LAD-D1 & RCA) -- CABG  . CAD S/P percutaneous coronary angioplasty    PCI to SVG-D1 insertion/native D1 x 4 = '01 -(S660 BMS 2.5 x 9 - insertion into D1; '02 - distal  overlap ACS Pixel 2.5 x 8  BMS; '03 distal/native ISR/Thrombosis - Pixel 2.5 x 13; '04 - ISR-  Taxus 2.5 x 20 (covered all);; 1/15 - mid SVG-D1 (50% distal ISR) - Promus P 2.75 x 20 -- 2.8 mm  . COPD mixed type ([place])    Followed by Dr. Lamonte Sakai "pulmonologist said no COPD"  . Depression   . Depression with anxiety   . Diabetes mellitus type 2 in obese ([place])   . Diarrhea    started after cholecystectomy and mass removed from intestine  . Dyslipidemia, goal LDL below 70    08/2012: TC 137, TG 200, HDL 32!, LDL 45; on statin (followed by Dr.Deterding)  . ESRD (end stage renal disease) ([place]) 1991   s/p Cadaveric Renal Transplant [facility] - Dr. Jimmy Footman)   . Family history of adverse reaction to anesthesia    mom's bp dropped during/after anesthesia  . Fibromyalgia   . GERD (gastroesophageal reflux disease)   . Glomerulonephritis, chronic, rapidly progressive 72  . 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 07/1993   Rescue PTCA of RCA -- referred for CABG.  . H/O: GI bleed   . Headache    migraines in the past  . History of kidney stones   . Hypertension associated with diabetes ([place])   . MRSA (methicillin resistant staph aureus) culture positive   . Obesity    s/p Lap Band 04/2004- Port Replacement 10/09 & 2/10 for infection; Dr. Excell Seltzer.  . OSA (obstructive sleep apnea)    no longer on CPAP or home O2, states she doesn't need now after lap  band  . PAD (peripheral artery disease) ([place]) 08/2013   LEA Dopplers to be read by Dr. Fletcher Anon  . PAF (paroxysmal atrial fibrillation) ([place]) 06/2014   Noted on CardioNet Monitor  - rate ~112; no recurrent symptoms. Nonionic regulation because of frequent GI procedures. Patient prefers Plavix  . Pneumonia   . Recurrent boils    Bilateral Groin  . Rheumatoid arthritis ([place])    Per Patient Report; associated with OA  . 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 Servando Snare: LIMA-LAD, SVG-bifurcatingD1, SVG-rPDA  . S/p cadaver renal transplant  [place]  . Stroke [facility]) 2012   "right eye stroke- half blind now"  . Unstable angina ([place]) 5/01; 3/'02, 8/'03, 10/'04; 1/15   x 5 occurences since Inf-Post STEMI in 1994   Past Surgical History:  Procedure Laterality Date  . ABDOMINAL AORTOGRAM N/A 04/21/2018   Procedure: ABDOMINAL AORTOGRAM;  Surgeon: [name], MD;  Location: [place] CV LAB;  Service: Cardiovascular;  Laterality: N/A;  . CARDIAC CATHETERIZATION  5/'01, 3/'02, 8/'03, 10/'04; 1/'15   08/22/2013: LAD & RCA 100%; LIMA-LAD & SVG-rPDA patent; Cx-- OM1 60%, OM2 ostial ~50%; SVG-D1 - 80% mid, 50% distal ISR --PCI  . CATHETER REMOVAL    . CHOLECYSTECTOMY N/A 10/29/2014   Procedure: LAPAROSCOPIC CHOLECYSTECTOMY WITH INTRAOPERATIVE CHOLANGIOGRAM;  Surgeon: Excell Seltzer, MD;  Location: [facility];  Service: General;  Laterality: N/A;  . CORONARY ANGIOPLASTY  1994   x5  . CORONARY ARTERY BYPASS GRAFT  1995   LIMA-LAD, SVG-RPDA, SVG-D1  . ESOPHAGOGASTRODUODENOSCOPY N/A 10/15/2016   Procedure: ESOPHAGOGASTRODUODENOSCOPY (EGD);  Surgeon: Wilford Corner, MD;  Location: [facility] ENDOSCOPY;  Service: Endoscopy;  Laterality: N/A;  . I&D EXTREMITY Right 01/29/2018   Procedure: IRRIGATION AND DEBRIDEMENT THUMB;  Surgeon: Dayna Barker, MD;  Location: [place];  Service: Plastics;  Laterality: Right;  . INCISE AND DRAIN ABCESS    . KIDNEY TRANSPLANT  1991  . KNEE ARTHROSCOPY WITH LATERAL MENISECTOMY Left 12/03/2017   Procedure: LEFT KNEE ARTHROSCOPY WITH LATERAL MENISECTOMY;  Surgeon: Earlie Server, MD;  Location: [place];  Service: Orthopedics;  Laterality: Left;  . LAPAROSCOPIC GASTRIC BANDING  04/2004; 10/'09, 2/'10   Port Replacement x 2  . LEFT HEART CATH AND CORS/GRAFTS ANGIOGRAPHY N/A 04/21/2018   Procedure: LEFT HEART CATH AND CORS/GRAFTS ANGIOGRAPHY;  Surgeon: [name], MD;  Location: [place] CV LAB;  Service: Cardiovascular;  Laterality: N/A;  . LEFT HEART CATHETERIZATION WITH CORONARY/GRAFT ANGIOGRAM N/A 08/23/2013    Procedure: LEFT HEART CATHETERIZATION WITH Beatrix Fetters;  Surgeon: Wellington Hampshire, MD;  Location: [place] CATH LAB;  Service: Cardiovascular;  Laterality: N/A;  . Lower Extremity Arterial Dopplers  08/2013   ABI: R 0.96, L 1.04  . MULTIPLE TOOTH EXTRACTIONS  age 79  . NM MYOVIEW LTD  03/2016   EF 62%. LOW RISK. C/W prior MI - no Ischemia. Apical hypokinesis.  [name] Kitchen PERCUTANEOUS CORONARY STENT INTERVENTION (PCI-S)  5/'01, 3/'02, 8/'03, 10/'04;   '01 - S660 BMS 2.5 x 9 - dSVG-D1 into D1; '02- post-stent stenosis - 2.5 x 8 Pixel BMS; '8\03: ISR/Thrombosis into native D1 - AngioJet, 2.5 x 13 Pixel; '04 - ISR 95% - covered stented area with Taxus DES 2.5 mm x 20 (2.88)  . PERCUTANEOUS CORONARY STENT INTERVENTION (PCI-S)  08/23/2013   Procedure: PERCUTANEOUS CORONARY STENT INTERVENTION (PCI-S);  Surgeon: Wellington Hampshire, MD;  Location: [facility] CATH LAB;  Service: Cardiovascular;;mid SVG-D1 80%; distal stent ~50% ISR; Promus Pre[place] DES 2.75 mm  xc 20 mm (2.8 mm)  . PORT-A-CATH REMOVAL     kidney  . TRANSTHORACIC ECHOCARDIOGRAM  02/2016   EF 55-60%. Septal dyssynergy from CABG GR 2 DD. Aortic valve is trileaflet the functional bicuspid with sclerosis but not yet notable for stenosis stenosis.; no Significant change  . TUBAL LIGATION    . wrist fistula repair Left    dialysis for one year    Current Outpatient Medications  Medication Sig Dispense Refill  . acetaminophen (TYLENOL) 500 MG tablet Take 1 tablet (500 mg total) by mouth every 6 (six) hours as needed for moderate pain or headache. (Patient taking differently: Take 1,000 mg by mouth every 6 (six) hours as needed for moderate pain or headache. ) 30 tablet 0  . albuterol (VENTOLIN HFA) 108 (90 Base) MCG/ACT inhaler INHALE 2 PUFFS INTO THE LUNGS EVERY 6 (SIX) HOURS AS NEEDED FOR WHEEZING OR SHORTNESS OF BREATH. (Patient taking differently: Inhale 2 puffs into the lungs every 6 (six) hours as needed for wheezing or shortness of breath. ) 18 Inhaler  2  . azaTHIOprine (IMURAN) 50 MG tablet Take 125 mg by mouth every evening.     . budesonide-formoterol (SYMBICORT) 160-4.5 MCG/ACT inhaler Inhale 2 puffs into the lungs 2 (two) times daily. 2 Inhaler 0  . calcitRIOL (ROCALTROL) 0.25 MCG capsule Take 0.25 mcg by mouth every 3 (three) days.     . carbamazepine (TEGRETOL) 200 MG tablet TAKE 200 MG BY MOUTH TWICE A DAY (Patient taking differently: Take 200 mg by mouth 2 (two) times daily. ) 60 tablet 2  . cyclobenzaprine (FLEXERIL) 10 MG tablet TAKE 1 TABLET BY MOUTH 3 TIMES DAILY AS NEEDED FOR MUSCLE SPASMS (Patient taking differently: Take 10 mg by mouth 2 (two) times daily. ) 90 tablet 2  . diltiazem (CARDIZEM CD) 120 MG 24 hr capsule TAKE 1 CAPSULE (120 MG TOTAL) BY MOUTH DAILY. NEED OV. (Patient taking differently: Take 120 mg by mouth daily. ) 30 capsule 0  . dofetilide (TIKOSYN) 250 MCG capsule Take 1 capsule (250 mcg total) by mouth 2 (two) times daily. 60 capsule 6  . DULoxetine (CYMBALTA) 30 MG capsule TAKE 2 CAPSULES (60 MG TOTAL) BY MOUTH EVERY MORNING. 180 capsule 2  . famotidine (PEPCID) 20 MG tablet Take 20 mg by mouth daily.    . ferrous sulfate 325 (65 FE) MG tablet Take 1 tablet (325 mg total) by mouth 3 (three) times daily with meals. (Patient taking differently: Take 325 mg by mouth 2 (two) times daily. ) 90 tablet 0  . fluticasone (FLONASE) 50 MCG/ACT nasal spray Place 2 sprays into both nostrils 2 (two) times daily. 48 g 1  . glimepiride (AMARYL) 4 MG tablet Take 4 mg by mouth every morning.  6  . guaiFENesin (MUCINEX) 600 MG 12 hr tablet Take 1 tablet (600 mg total) by mouth 2 (two) times daily. (Patient taking differently: Take 600 mg by mouth 2 (two) times daily as needed for cough or to loosen phlegm. ) 30 tablet 0  . Insulin Glargine (BASAGLAR KWIKPEN) 100 UNIT/ML SOPN Inject 12 Units into the skin daily.    [name] Kitchen LORazepam (ATIVAN) 1 MG tablet Take 1 mg by mouth every morning.     . metFORMIN (GLUCOPHAGE) 500 MG tablet Take 500  mg by mouth 2 (two) times daily.  6  . metoprolol tartrate (LOPRESSOR) 25 MG tablet Take 1 tablet (25 mg total) by mouth 2 (two) times daily. 180 tablet 1  . montelukast (SINGULAIR) 10  MG tablet TAKE 1 TABLET BY MOUTH EVERY DAY 90 tablet 1  . NOVOLOG FLEXPEN 100 UNIT/ML FlexPen Inject 4 Units into the skin 2 (two) times daily as needed for high blood sugar.   6  . omeprazole (PRILOSEC) 40 MG capsule Take 40 mg by mouth every morning.     [name] Kitchen oxyCODONE (OXY IR/ROXICODONE) 5 MG immediate release tablet Take 1 tablet (5 mg total) by mouth every 8 (eight) hours as needed for severe pain. 60 tablet 0  . predniSONE (DELTASONE) 5 MG tablet Take 5 mg by mouth daily with breakfast.    . promethazine (PHENERGAN) 25 MG tablet Take 1 tablet (25 mg total) by mouth every 6 (six) hours as needed for nausea or vomiting (or sleep). 15 tablet 0  . rosuvastatin (CRESTOR) 20 MG tablet TAKE 1 TABLET BY MOUTH EVERY DAY 90 tablet 1  . sertraline (ZOLOFT) 100 MG tablet Take 100 mg by mouth every morning.    . warfarin (COUMADIN) 2.5 MG tablet Take 3 tablets (7.5mg ) on 9/10. Then take 2 tablets (5mg ) on 9/11. Then go for INR check on 9/12 as scheduled for further instructions. 90 tablet 0  . clotrimazole (MYCELEX) 10 MG troche Take 1 tablet (10 mg total) by mouth 5 (five) times daily. 70 tablet 0  . nitroGLYCERIN (NITROSTAT) 0.4 MG SL tablet Place 1 tablet (0.4 mg total) under the tongue every 5 (five) minutes as needed for chest pain. 25 tablet 6   No current facility-administered medications for this encounter.     Allergies  Allergen Reactions  . Tetracycline Hives  . Hydromorphone Hcl Nausea And Vomiting  . Niacin Other (See Comments)    Mouth blisters  . Niaspan [Niacin Er] Other (See Comments)    Mouth blisters  . Sulfa Antibiotics Nausea Only and Other (See Comments)    "Tears up stomach"  . Sulfonamide Derivatives Other (See Comments)    Reaction: per patient "tears her stomach up"  . Codeine Nausea And  Vomiting  . Erythromycin Nausea And Vomiting  . Hydromorphone Hcl Nausea And Vomiting  . Morphine And Related Nausea And Vomiting  . Nalbuphine Nausea And Vomiting  . Sulfasalazine Nausea Only and Other (See Comments)    per patient "tears her stomach up", "Tears up stomach"  . Tape Rash and Other (See Comments)    No "plastic" tape," please    Social History   Socioeconomic History  . Marital status: Married    Spouse name: Not on file  . Number of children: Not on file  . Years of education: Not on file  . Highest education level: Not on file  Occupational History  . Not on file  Social Needs  . Financial resource strain: Not on file  . Food insecurity:    Worry: Not on file    Inability: Not on file  . Transportation needs:    Medical: Not on file    Non-medical: Not on file  Tobacco Use  . Smoking status: Former Smoker    Packs/day: 1.00    Years: 30.00    Pack years: 30.00    Types: Cigarettes    Last attempt to quit: 08/17/2002    Years since quitting: 15.7  . Smokeless tobacco: Never Used  Substance and Sexual Activity  . Alcohol use: No  . Drug use: No  . Sexual activity: Not on file  Lifestyle  . Physical activity:    Days per week: Not on file    Minutes per  session: Not on file  . Stress: Not on file  Relationships  . Social connections:    Talks on phone: Not on file    Gets together: Not on file    Attends religious service: Not on file    Active member of club or organization: Not on file    Attends meetings of clubs or organizations: Not on file    Relationship status: Not on file  . Intimate partner violence:    Fear of current or ex partner: Not on file    Emotionally abused: Not on file    Physically abused: Not on file    Forced sexual activity: Not on file  Other Topics Concern  . Not on file  Social History Narrative   She is currently married, and the caregiver of her husband who is recovering from surgery for tongue cancer now  diagnosed with lung cancer. Prior to his diagnosis of her husband, she actually had adopted a 18-year-old child who she knows caring for as well. With all the surgeries, they have been quite financially troubled. Thanks the help of her community and church, they have been able to stay "alfoat."     She is a former smoker who quit in 2004 after a 30-pack-year history.   She is active chasing a 47-year-old child, does not do routine exercise. She's been quite depressed with the condition of her husband, and admits to eating comfort herself.   She does not drink alcohol.    Family History  Problem Relation Age of Onset  . Cancer Mother        liver  . Heart disease Father   . Cancer Father        colon  . Arrhythmia Brother        Atrial Fibrillation  . Arrhythmia Paternal Aunt        Atrial Fibrillation    ROS- All systems are reviewed and negative except as per the HPI above  Physical Exam: Vitals:   05/06/18 1152  BP: 112/60  Pulse: (!) 55  Weight: 106.4 kg  Height: 5\' 1"  (1.549 m)   Wt Readings from Last 3 Encounters:  05/06/18 106.4 kg  05/03/18 106.4 kg  04/26/18 104.7 kg    Labs: Lab Results  Component Value Date   NA 136 05/06/2018   K 5.3 (H) 05/06/2018   CL 103 05/06/2018   CO2 24 05/06/2018   GLUCOSE 196 (H) 05/06/2018   BUN 13 05/06/2018   CREATININE 0.98 05/06/2018   CALCIUM 9.3 05/06/2018   PHOS 3.4 09/15/2010   MG 1.9 05/06/2018   Lab Results  Component Value Date   INR 2.2 05/03/2018   Lab Results  Component Value Date   CHOL 111 11/30/2017   HDL 53.90 11/30/2017   LDLCALC 32 11/30/2017   TRIG 129.0 11/30/2017     GEN- The patient is well appearing, alert and oriented x 3 today.   Head- normocephalic, atraumatic Eyes-  Sclera clear, conjunctiva pink Ears- hearing intact Oropharynx- clear Neck- supple, no JVP Lymph- no cervical lymphadenopathy Lungs- Clear to ausculation bilaterally, normal work of breathing Heart- Regular rate and  rhythm, no murmurs, rubs or gallops, PMI not laterally displaced GI- soft, NT, ND, + BS Extremities- no clubbing, cyanosis, or edema MS- no significant deformity or atrophy Skin- no rash or lesion Psych- euthymic mood, full affect Neuro- strength and sensation are intact  EKG- Sinus brady at 55 bpm, pr int 163 ms, qrs int 86 ms,  qtc 461 ms (stable) Epic records reviewed    Assessment and Plan: 1. Paroxysmal Afib  Recent hospitalization with chest pain and afib with RVR Now on Tikosyn and in normal rhythm  General review of precautions with Tikosyn discussed   2. CHA2DS2VASc of at least 5 Continue warfarin with next INR scheduled 9/23  3. CAD Stable   F/u with Dr. Rayann Heman 10/9   Geroge Baseman. Briani Maul, [facility] [address] [place], [place] [address] [phone]

## 2018-05-10 ENCOUNTER — Encounter: Payer: Self-pay | Admitting: Internal Medicine

## 2018-05-12 ENCOUNTER — Other Ambulatory Visit: Payer: Self-pay | Admitting: Cardiology

## 2018-05-12 ENCOUNTER — Other Ambulatory Visit: Payer: Self-pay | Admitting: *Deleted

## 2018-05-12 NOTE — Patient Outreach (Signed)
[place] [facility]) Care Management  05/12/2018  Katherine [name] [birth year] [id number]   Call placed to member to follow up on current health status and to reschedule canceled home visit.  She report she is still under the weather, chest congestion and headache.  Denies reaching out to her primary MD office as of yet, advised to do so as her condition is not improving. She agrees and will contact office for visit.  Home visit rescheduled for within the next 2 weeks.  Valente David, [place], MSN [place] [phone]

## 2018-05-12 NOTE — Telephone Encounter (Signed)
Rx request sent to pharmacy.  

## 2018-05-20 ENCOUNTER — Ambulatory Visit (INDEPENDENT_AMBULATORY_CARE_PROVIDER_SITE_OTHER): Payer: Medicare Other | Admitting: *Deleted

## 2018-05-20 DIAGNOSIS — I639 Cerebral infarction, unspecified: Secondary | ICD-10-CM | POA: Diagnosis not present

## 2018-05-20 DIAGNOSIS — E118 Type 2 diabetes mellitus with unspecified complications: Secondary | ICD-10-CM | POA: Diagnosis not present

## 2018-05-20 DIAGNOSIS — I4891 Unspecified atrial fibrillation: Secondary | ICD-10-CM | POA: Diagnosis not present

## 2018-05-20 DIAGNOSIS — I480 Paroxysmal atrial fibrillation: Secondary | ICD-10-CM

## 2018-05-20 DIAGNOSIS — I739 Peripheral vascular disease, unspecified: Secondary | ICD-10-CM | POA: Diagnosis not present

## 2018-05-20 DIAGNOSIS — N2581 Secondary hyperparathyroidism of renal origin: Secondary | ICD-10-CM | POA: Diagnosis not present

## 2018-05-20 DIAGNOSIS — I2510 Atherosclerotic heart disease of native coronary artery without angina pectoris: Secondary | ICD-10-CM | POA: Diagnosis not present

## 2018-05-20 DIAGNOSIS — Z940 Kidney transplant status: Secondary | ICD-10-CM | POA: Diagnosis not present

## 2018-05-20 DIAGNOSIS — N182 Chronic kidney disease, stage 2 (mild): Secondary | ICD-10-CM | POA: Diagnosis not present

## 2018-05-20 DIAGNOSIS — E669 Obesity, unspecified: Secondary | ICD-10-CM | POA: Diagnosis not present

## 2018-05-20 DIAGNOSIS — E785 Hyperlipidemia, unspecified: Secondary | ICD-10-CM | POA: Diagnosis not present

## 2018-05-20 DIAGNOSIS — Z7901 Long term (current) use of anticoagulants: Secondary | ICD-10-CM | POA: Diagnosis not present

## 2018-05-20 DIAGNOSIS — D631 Anemia in chronic kidney disease: Secondary | ICD-10-CM | POA: Diagnosis not present

## 2018-05-20 DIAGNOSIS — I129 Hypertensive chronic kidney disease with stage 1 through stage 4 chronic kidney disease, or unspecified chronic kidney disease: Secondary | ICD-10-CM | POA: Diagnosis not present

## 2018-05-20 LAB — POCT INR: INR: 1.8 — AB (ref 2.0–3.0)

## 2018-05-20 NOTE — Patient Instructions (Signed)
Description   Today take another whole tablet then start taking 4 tablets daily except 3 tablets on Wednesdays and Fridays.  Recheck in 1 week. Coumadin Clinic [phone] call with procedures or new medications.

## 2018-05-23 ENCOUNTER — Other Ambulatory Visit: Payer: Self-pay

## 2018-05-23 MED ORDER — WARFARIN SODIUM 2.5 MG PO TABS: ORAL_TABLET | ORAL | 1 refills | Status: DC

## 2018-05-25 ENCOUNTER — Ambulatory Visit: Payer: Medicare Other | Admitting: Internal Medicine

## 2018-05-26 ENCOUNTER — Other Ambulatory Visit: Payer: Self-pay | Admitting: *Deleted

## 2018-05-26 NOTE — Patient Outreach (Signed)
[name] [facility]) Care Management  05/26/2018  Katherine [name] [birth year] [id number]   Call placed to member to confirm she would be available for home visit this afternoon, no answer.  HIPAA compliant voice message left.  Attempted to proceed with home visit, unable to find home, no answer with call to member to aide with directions.  Message later received from member stating her home is infested with fleas and roaches and her landlord is spraying today.  Will await call back from member, if no call back will follow up within the next 4 business days.  Valente David, [place], MSN [place] [phone]

## 2018-05-31 ENCOUNTER — Other Ambulatory Visit: Payer: Self-pay | Admitting: *Deleted

## 2018-05-31 NOTE — Patient Outreach (Signed)
[place] [facility]) Care Management  05/31/2018  Emine Lopata 10-15-1955 [id number]   Voice message received from member requesting call back.  Call placed to member to follow up on missed home visit, no answer.  HIPAA compliant voice message left, will follow up within the next 4 business days.  Valente David, [place], MSN [place] [phone]

## 2018-06-03 ENCOUNTER — Other Ambulatory Visit: Payer: Self-pay | Admitting: *Deleted

## 2018-06-03 ENCOUNTER — Ambulatory Visit (INDEPENDENT_AMBULATORY_CARE_PROVIDER_SITE_OTHER): Payer: Medicare Other | Admitting: Pharmacist

## 2018-06-03 DIAGNOSIS — H903 Sensorineural hearing loss, bilateral: Secondary | ICD-10-CM | POA: Diagnosis not present

## 2018-06-03 DIAGNOSIS — N182 Chronic kidney disease, stage 2 (mild): Secondary | ICD-10-CM | POA: Diagnosis not present

## 2018-06-03 DIAGNOSIS — N189 Chronic kidney disease, unspecified: Secondary | ICD-10-CM | POA: Diagnosis not present

## 2018-06-03 DIAGNOSIS — Z7901 Long term (current) use of anticoagulants: Secondary | ICD-10-CM

## 2018-06-03 DIAGNOSIS — H6062 Unspecified chronic otitis externa, left ear: Secondary | ICD-10-CM | POA: Diagnosis not present

## 2018-06-03 DIAGNOSIS — I480 Paroxysmal atrial fibrillation: Secondary | ICD-10-CM | POA: Diagnosis not present

## 2018-06-03 DIAGNOSIS — H6122 Impacted cerumen, left ear: Secondary | ICD-10-CM | POA: Diagnosis not present

## 2018-06-03 LAB — POCT INR: INR: 1.6 — AB (ref 2.0–3.0)

## 2018-06-03 MED ORDER — WARFARIN SODIUM 10 MG PO TABS: ORAL_TABLET | ORAL | 2 refills | Status: DC

## 2018-06-03 NOTE — Patient Outreach (Signed)
[place] [facility]) Care Management  06/03/2018  Shanisha Lech 1955/09/04 [id number]   Call placed to member to follow up on missed home visits and current health status, no answer.  3rd consecutive unsuccessful outreach attempt.  HIPAA compliant voice message left.  Unsuccessful outreach letter sent, if no response by 10/24 will close case.  Valente David, [place], MSN [place] [phone]

## 2018-06-03 NOTE — Patient Instructions (Signed)
Description   Switch over to the warfarin 10mg  tablet strength and start taking 1 tablet (10mg ) daily except 1.5 tablets (15mg ) on Wednesdays. Recheck in 1 week. Coumadin Clinic [phone] call with procedures or new medications.

## 2018-06-09 ENCOUNTER — Other Ambulatory Visit: Payer: Self-pay | Admitting: *Deleted

## 2018-06-09 ENCOUNTER — Other Ambulatory Visit: Payer: Self-pay | Admitting: Emergency Medicine

## 2018-06-09 ENCOUNTER — Other Ambulatory Visit: Payer: Self-pay | Admitting: Registered Nurse

## 2018-06-09 DIAGNOSIS — M797 Fibromyalgia: Secondary | ICD-10-CM

## 2018-06-09 DIAGNOSIS — M47816 Spondylosis without myelopathy or radiculopathy, lumbar region: Secondary | ICD-10-CM

## 2018-06-09 NOTE — Patient Outreach (Signed)
[place] [facility]) Care Management  06/09/2018  Alitza Cowman 05/06/1956 [id number]   No contact back from member after multiple outreach attempts.  Will close case due to inability to maintain contact.  Will notify primary MD and member of case closure.  Valente David, [place], MSN [place] [phone]

## 2018-06-13 ENCOUNTER — Ambulatory Visit (INDEPENDENT_AMBULATORY_CARE_PROVIDER_SITE_OTHER): Payer: Medicare Other | Admitting: Pharmacist

## 2018-06-13 DIAGNOSIS — Z7901 Long term (current) use of anticoagulants: Secondary | ICD-10-CM

## 2018-06-13 DIAGNOSIS — I480 Paroxysmal atrial fibrillation: Secondary | ICD-10-CM

## 2018-06-13 LAB — POCT INR: INR: 1.8 — AB (ref 2.0–3.0)

## 2018-06-13 NOTE — Patient Instructions (Signed)
Description   Take an extra two green tablets today, 10/28, then start taking 4 tablets every day except Wednesday and Saturday. Recheck in 1 week. She has 10 mg tablets at home, completing supply of green tablets. Coumadin Clinic [phone] call with procedures or new medications.

## 2018-06-15 ENCOUNTER — Other Ambulatory Visit: Payer: Self-pay | Admitting: Internal Medicine

## 2018-06-30 ENCOUNTER — Ambulatory Visit: Payer: Medicare Other | Admitting: Internal Medicine

## 2018-07-02 IMAGING — CT CT CHEST W/O CM
2 of 3 series · 15 of 36 positions shown, 18 images · non-contrast
Comparison: Chest CT October 14, 2016 and chest radiograph December 28, 2016

CLINICAL DATA: Pulmonary nodules

EXAM:
CT CHEST WITHOUT CONTRAST
TECHNIQUE: Multidetector CT imaging of the chest was performed following the
standard protocol without IV contrast.

[Series 2: thorax · axial · 0.73mm/px · z∈[-298,-50]mm · 12 of 146 slices shown, 15 images]
[im 11/146  mediastinal]
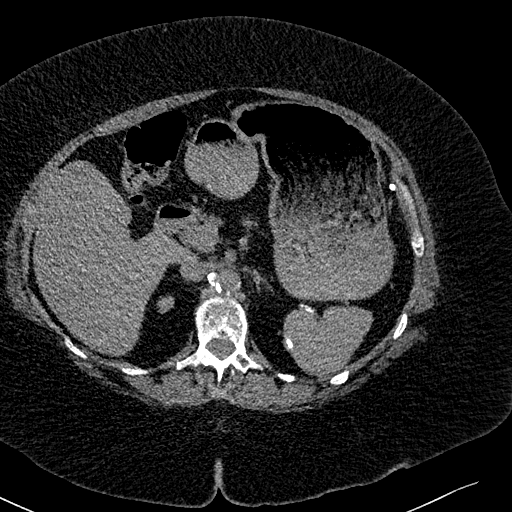
[im 11/146  lung]
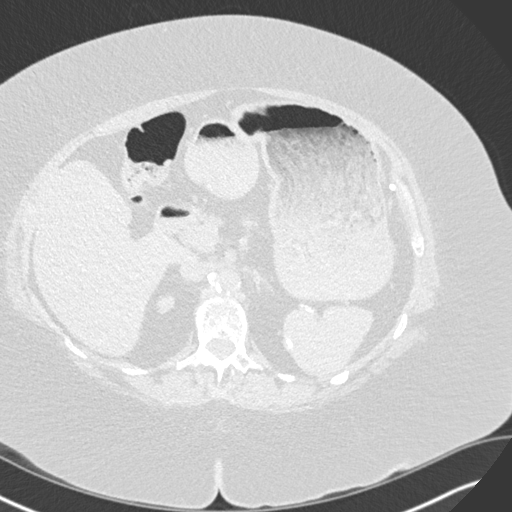
[im 22/146  lung]
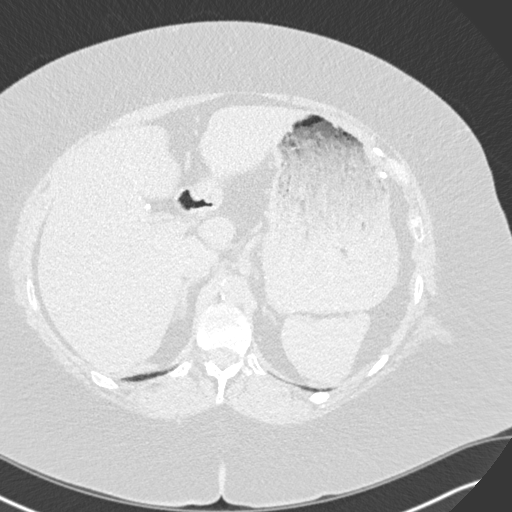
[im 33/146  lung]
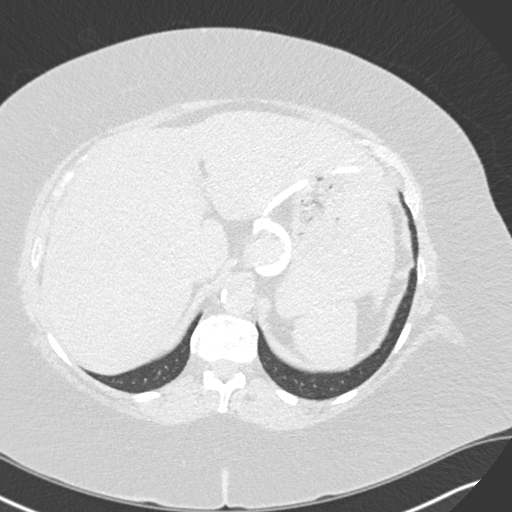
[im 43/146  lung]
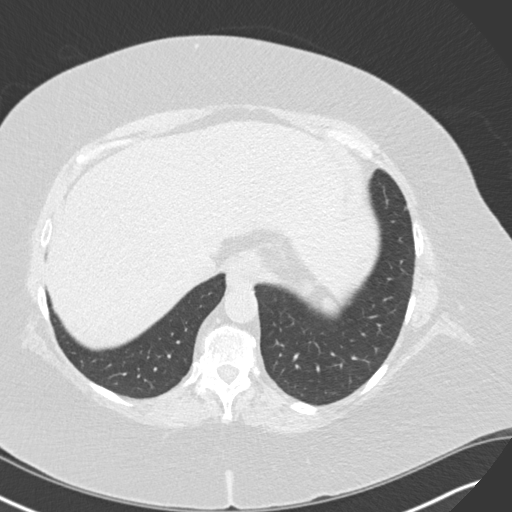
[im 54/146  mediastinal]
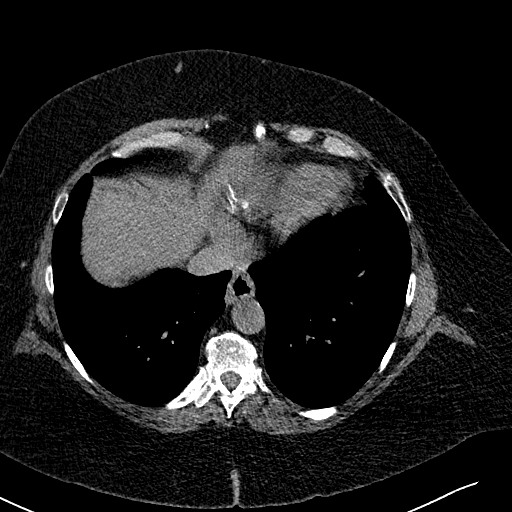
[im 54/146  lung]
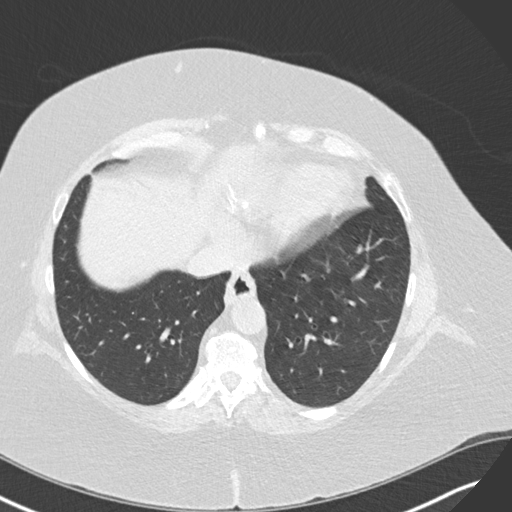
[im 65/146  lung]
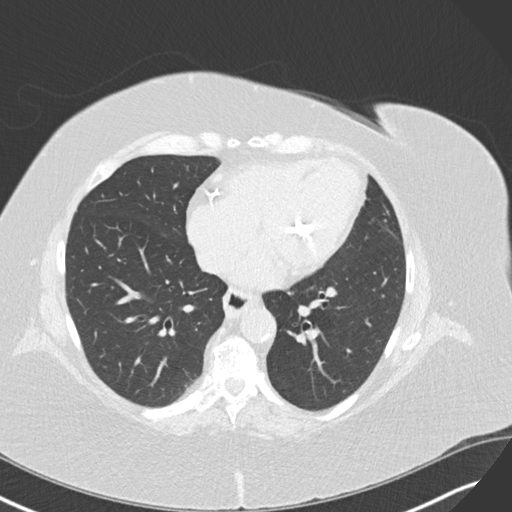
[im 81/146  lung]
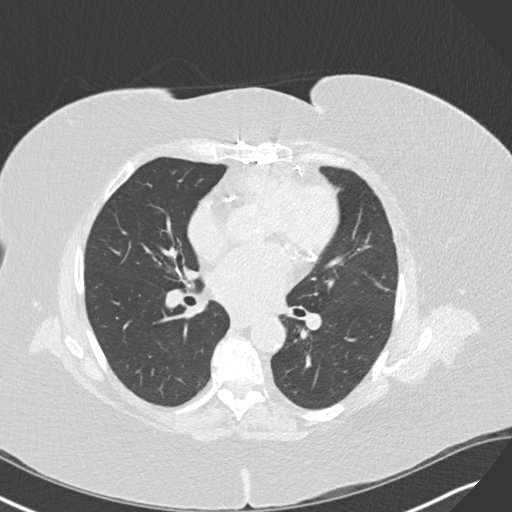
[im 92/146  lung]
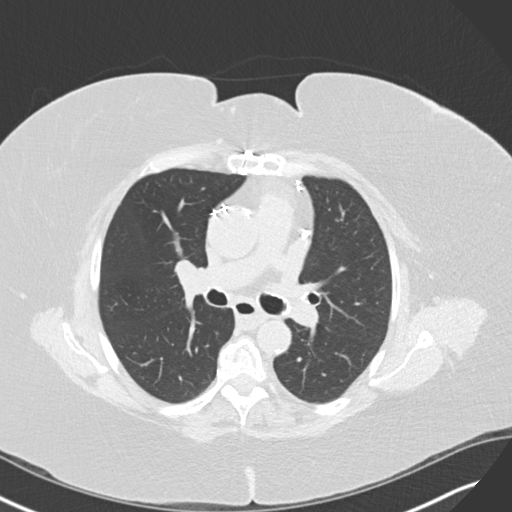
[im 103/146  mediastinal]
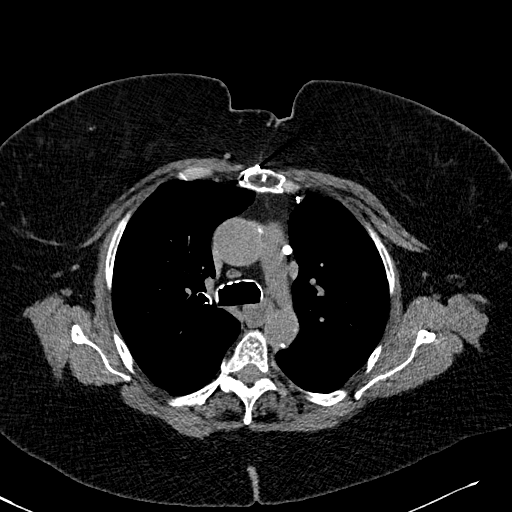
[im 103/146  lung]
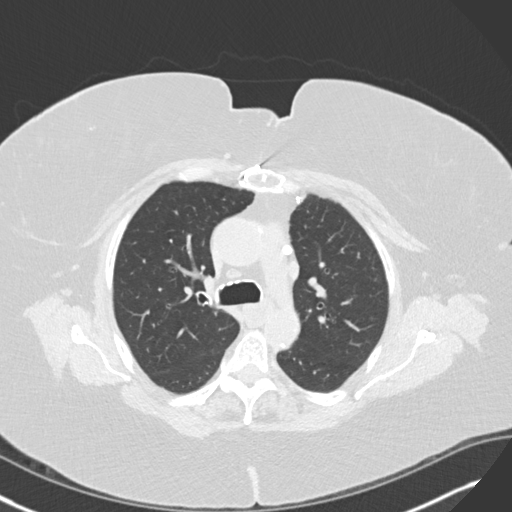
[im 113/146  lung]
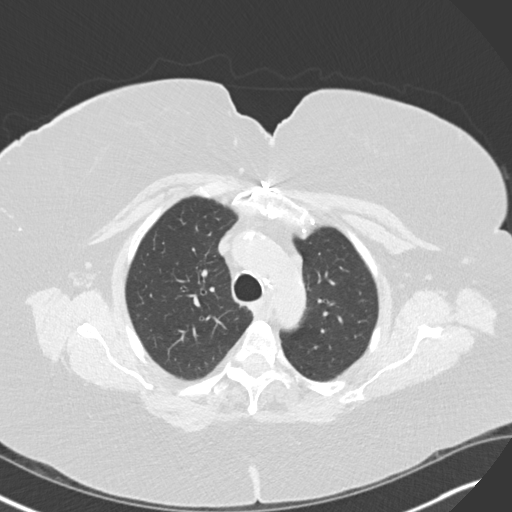
[im 124/146  lung]
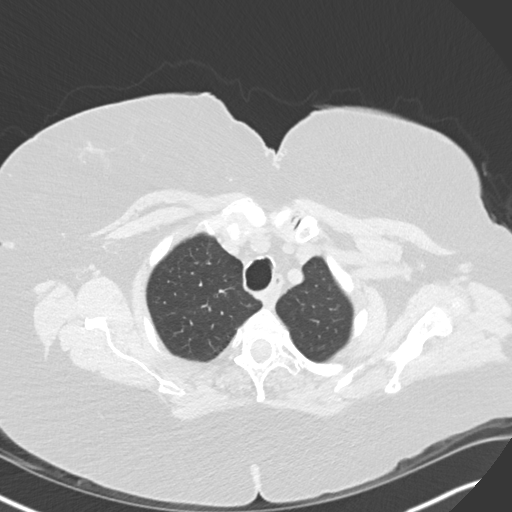
[im 135/146  lung]
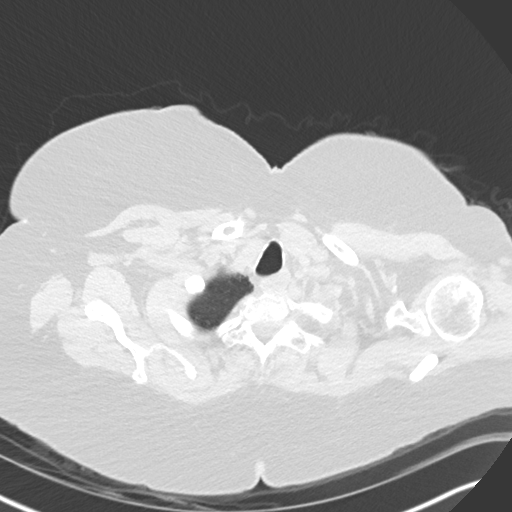

[Series 5: coronal · coronal · 0.59mm/px · 3 of 151 slices shown]
[im 31/151  lung]
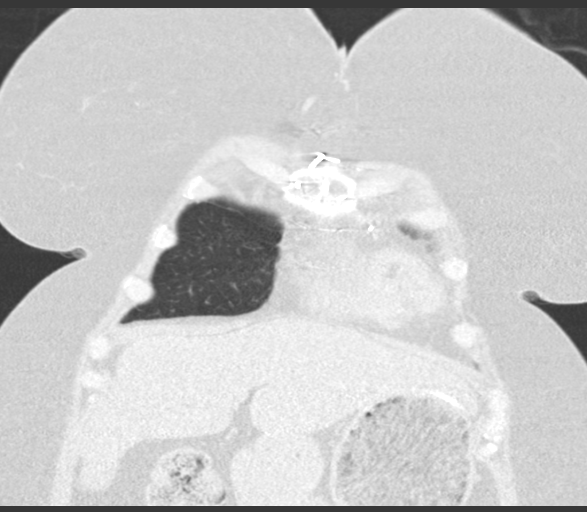
[im 61/151  lung]
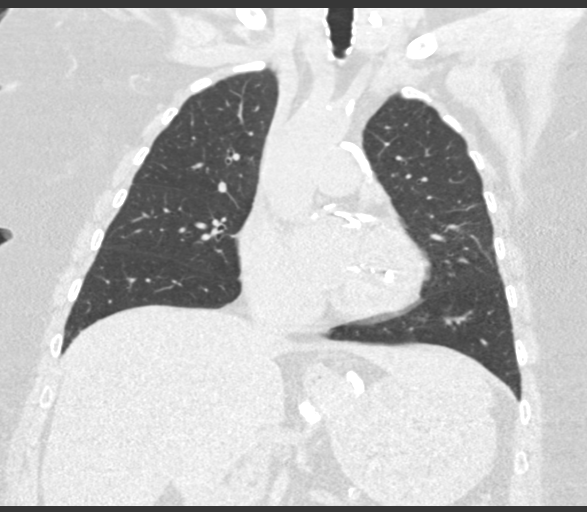
[im 91/151  lung]
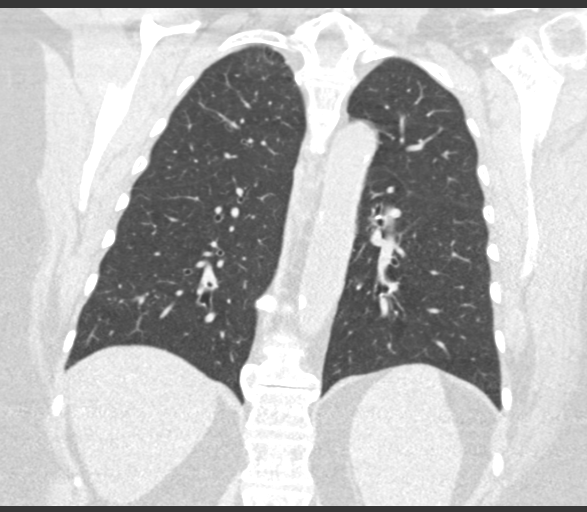

[15 of 36 positions shown; findings below may reference images not displayed]

FINDINGS: Cardiovascular: There is no thoracic aortic aneurysm. There is
calcification at the origins of the visualized great vessels. There
is calcification within the thoracic aorta. There are multiple foci
of coronary artery calcification. Patient is status post coronary
artery bypass grafting. Pericardium is not appreciably thickened.

Mediastinum/Nodes: In the visualized thyroid, there are scattered
calcifications, stable. No new thyroid lesion evident. There are
scattered subcentimeter mediastinal lymph nodes. There is no
adenopathy by size criteria. There is a small hiatal hernia. There
is esophageal wall thickening at multiple sites in the esophagus.
The previously noted fluid in the esophagus is less apparent at this
time although present.

Lungs/Pleura: Previously noted areas of patchy semi-solid in
ground-glass type opacity are no longer evident consistent with
resolution of pneumonia. Currently, there is no edema or
consolidation. There is a stable 2 mm nodular opacity in the
anterior segment of the right upper lobe seen on axial slice 25
series 3. There are areas of mild scarring in the anterior left base
and right apex regions. There is no pleural effusion or pleural
thickening.

Upper Abdomen: There is a lap band at the gastric cardia. There is
atherosclerotic calcification in the upper abdominal aorta. Native
kidneys are markedly atrophic bilaterally. Gallbladder is absent.

Musculoskeletal: Patient is status post median sternotomy. There is
multifocal degenerative change in the thoracic spine. There are no
blastic or lytic bone lesions.
IMPRESSION: 1. Interval clearing of areas of ground-glass and semi-solid
opacity. This change is indicative of resolution of patchy
multifocal pneumonia since prior study. Currently there are minimal
areas of lung scarring as well is a stable 2 mm nodular opacity in
the anterior segment right upper lobe. No new parenchymal lung
lesions evident.

2.  No evident adenopathy.

3. Areas of esophageal wall thickening with fluid in the esophagus
consistent with either reflux or motility disorder with underlying
esophagitis.

4. Thoracic aortic atherosclerosis as well as calcification at the
origins of the visualized great vessels. There are foci of native
coronary artery calcification. Patient is status post coronary
artery bypass grafting.

5.  Lap band at gastric cardia.

6.  Gallbladder absent.

7.  Atrophic native kidneys.

Aortic Atherosclerosis (BNFIS-CR2.2).

## 2018-07-04 ENCOUNTER — Ambulatory Visit (INDEPENDENT_AMBULATORY_CARE_PROVIDER_SITE_OTHER): Payer: Medicare Other | Admitting: *Deleted

## 2018-07-04 ENCOUNTER — Ambulatory Visit (INDEPENDENT_AMBULATORY_CARE_PROVIDER_SITE_OTHER): Payer: Medicare Other | Admitting: Internal Medicine

## 2018-07-04 ENCOUNTER — Ambulatory Visit (INDEPENDENT_AMBULATORY_CARE_PROVIDER_SITE_OTHER)
Admission: RE | Admit: 2018-07-04 | Discharge: 2018-07-04 | Disposition: A | Discharge status: Home / Self Care | Payer: Medicare Other | Source: Ambulatory Visit | Attending: Internal Medicine | Admitting: Internal Medicine

## 2018-07-04 ENCOUNTER — Encounter: Payer: Self-pay | Admitting: Internal Medicine

## 2018-07-04 ENCOUNTER — Other Ambulatory Visit (INDEPENDENT_AMBULATORY_CARE_PROVIDER_SITE_OTHER): Payer: Medicare Other

## 2018-07-04 ENCOUNTER — Encounter: Payer: Medicare Other | Attending: Physical Medicine & Rehabilitation | Admitting: Physical Medicine & Rehabilitation

## 2018-07-04 ENCOUNTER — Encounter: Payer: Self-pay | Admitting: Physical Medicine & Rehabilitation

## 2018-07-04 VITALS — BP 140/70 | HR 67 | Temp 98.2°F | Resp 16 | Ht 61.0 in | Wt 245.0 lb

## 2018-07-04 VITALS — BP 159/81 | HR 77 | Resp 14 | Ht 61.0 in | Wt 245.0 lb

## 2018-07-04 DIAGNOSIS — M069 Rheumatoid arthritis, unspecified: Secondary | ICD-10-CM | POA: Insufficient documentation

## 2018-07-04 DIAGNOSIS — Z951 Presence of aortocoronary bypass graft: Secondary | ICD-10-CM | POA: Insufficient documentation

## 2018-07-04 DIAGNOSIS — M170 Bilateral primary osteoarthritis of knee: Secondary | ICD-10-CM | POA: Insufficient documentation

## 2018-07-04 DIAGNOSIS — Z9851 Tubal ligation status: Secondary | ICD-10-CM | POA: Diagnosis not present

## 2018-07-04 DIAGNOSIS — B029 Zoster without complications: Secondary | ICD-10-CM

## 2018-07-04 DIAGNOSIS — G4733 Obstructive sleep apnea (adult) (pediatric): Secondary | ICD-10-CM | POA: Diagnosis not present

## 2018-07-04 DIAGNOSIS — I120 Hypertensive chronic kidney disease with stage 5 chronic kidney disease or end stage renal disease: Secondary | ICD-10-CM | POA: Diagnosis not present

## 2018-07-04 DIAGNOSIS — Z87891 Personal history of nicotine dependence: Secondary | ICD-10-CM | POA: Insufficient documentation

## 2018-07-04 DIAGNOSIS — F418 Other specified anxiety disorders: Secondary | ICD-10-CM | POA: Insufficient documentation

## 2018-07-04 DIAGNOSIS — M7591 Shoulder lesion, unspecified, right shoulder: Secondary | ICD-10-CM | POA: Insufficient documentation

## 2018-07-04 DIAGNOSIS — Z7901 Long term (current) use of anticoagulants: Secondary | ICD-10-CM

## 2018-07-04 DIAGNOSIS — Z6841 Body Mass Index (BMI) 40.0 and over, adult: Secondary | ICD-10-CM | POA: Diagnosis not present

## 2018-07-04 DIAGNOSIS — E785 Hyperlipidemia, unspecified: Secondary | ICD-10-CM | POA: Insufficient documentation

## 2018-07-04 DIAGNOSIS — G894 Chronic pain syndrome: Secondary | ICD-10-CM

## 2018-07-04 DIAGNOSIS — I2510 Atherosclerotic heart disease of native coronary artery without angina pectoris: Secondary | ICD-10-CM

## 2018-07-04 DIAGNOSIS — Z940 Kidney transplant status: Secondary | ICD-10-CM | POA: Insufficient documentation

## 2018-07-04 DIAGNOSIS — E1142 Type 2 diabetes mellitus with diabetic polyneuropathy: Secondary | ICD-10-CM | POA: Diagnosis not present

## 2018-07-04 DIAGNOSIS — F411 Generalized anxiety disorder: Secondary | ICD-10-CM | POA: Diagnosis not present

## 2018-07-04 DIAGNOSIS — I480 Paroxysmal atrial fibrillation: Secondary | ICD-10-CM

## 2018-07-04 DIAGNOSIS — Z9861 Coronary angioplasty status: Secondary | ICD-10-CM

## 2018-07-04 DIAGNOSIS — E1122 Type 2 diabetes mellitus with diabetic chronic kidney disease: Secondary | ICD-10-CM | POA: Insufficient documentation

## 2018-07-04 DIAGNOSIS — Z955 Presence of coronary angioplasty implant and graft: Secondary | ICD-10-CM | POA: Diagnosis not present

## 2018-07-04 DIAGNOSIS — Z5181 Encounter for therapeutic drug level monitoring: Secondary | ICD-10-CM | POA: Diagnosis not present

## 2018-07-04 DIAGNOSIS — D539 Nutritional anemia, unspecified: Secondary | ICD-10-CM

## 2018-07-04 DIAGNOSIS — R05 Cough: Secondary | ICD-10-CM

## 2018-07-04 DIAGNOSIS — R059 Cough, unspecified: Secondary | ICD-10-CM

## 2018-07-04 DIAGNOSIS — M797 Fibromyalgia: Secondary | ICD-10-CM | POA: Insufficient documentation

## 2018-07-04 DIAGNOSIS — E669 Obesity, unspecified: Secondary | ICD-10-CM | POA: Insufficient documentation

## 2018-07-04 DIAGNOSIS — M545 Low back pain: Secondary | ICD-10-CM | POA: Insufficient documentation

## 2018-07-04 DIAGNOSIS — R0602 Shortness of breath: Secondary | ICD-10-CM | POA: Diagnosis not present

## 2018-07-04 DIAGNOSIS — M47816 Spondylosis without myelopathy or radiculopathy, lumbar region: Secondary | ICD-10-CM

## 2018-07-04 DIAGNOSIS — Z23 Encounter for immunization: Secondary | ICD-10-CM | POA: Diagnosis not present

## 2018-07-04 DIAGNOSIS — E1151 Type 2 diabetes mellitus with diabetic peripheral angiopathy without gangrene: Secondary | ICD-10-CM | POA: Diagnosis not present

## 2018-07-04 DIAGNOSIS — M79605 Pain in left leg: Secondary | ICD-10-CM | POA: Diagnosis not present

## 2018-07-04 DIAGNOSIS — Z8673 Personal history of transient ischemic attack (TIA), and cerebral infarction without residual deficits: Secondary | ICD-10-CM | POA: Diagnosis not present

## 2018-07-04 DIAGNOSIS — Z79891 Long term (current) use of opiate analgesic: Secondary | ICD-10-CM | POA: Diagnosis not present

## 2018-07-04 DIAGNOSIS — J449 Chronic obstructive pulmonary disease, unspecified: Secondary | ICD-10-CM | POA: Diagnosis not present

## 2018-07-04 DIAGNOSIS — Z9049 Acquired absence of other specified parts of digestive tract: Secondary | ICD-10-CM | POA: Diagnosis not present

## 2018-07-04 DIAGNOSIS — M79604 Pain in right leg: Secondary | ICD-10-CM | POA: Diagnosis not present

## 2018-07-04 LAB — VITAMIN B12: VITAMIN B 12: 239 pg/mL (ref 211–911)

## 2018-07-04 LAB — IBC PANEL
Iron: 82 ug/dL (ref 42–145)
Saturation Ratios: 25.8 % (ref 20.0–50.0)
TRANSFERRIN: 227 mg/dL (ref 212.0–360.0)

## 2018-07-04 LAB — CBC WITH DIFFERENTIAL/PLATELET
BASOS ABS: 0 10*3/uL (ref 0.0–0.1)
Basophils Relative: 0.6 % (ref 0.0–3.0)
Eosinophils Absolute: 0.1 10*3/uL (ref 0.0–0.7)
Eosinophils Relative: 0.9 % (ref 0.0–5.0)
HCT: 35.5 % — ABNORMAL LOW (ref 36.0–46.0)
Hemoglobin: 11.9 g/dL — ABNORMAL LOW (ref 12.0–15.0)
LYMPHS ABS: 1.1 10*3/uL (ref 0.7–4.0)
Lymphocytes Relative: 13.9 % (ref 12.0–46.0)
MCHC: 33.6 g/dL (ref 30.0–36.0)
MCV: 91.8 fl (ref 78.0–100.0)
MONOS PCT: 9.2 % (ref 3.0–12.0)
Monocytes Absolute: 0.7 10*3/uL (ref 0.1–1.0)
NEUTROS ABS: 5.7 10*3/uL (ref 1.4–7.7)
NEUTROS PCT: 75.4 % (ref 43.0–77.0)
Platelets: 237 10*3/uL (ref 150.0–400.0)
RBC: 3.86 Mil/uL — ABNORMAL LOW (ref 3.87–5.11)
RDW: 16.7 % — ABNORMAL HIGH (ref 11.5–15.5)
WBC: 7.6 10*3/uL (ref 4.0–10.5)

## 2018-07-04 LAB — HEMOGLOBIN A1C: Hgb A1c MFr Bld: 7 % — ABNORMAL HIGH (ref 4.6–6.5)

## 2018-07-04 LAB — POCT INR: INR: 2.4 (ref 2.0–3.0)

## 2018-07-04 LAB — FERRITIN: FERRITIN: 32.5 ng/mL (ref 10.0–291.0)

## 2018-07-04 LAB — FOLATE: FOLATE: 9.2 ng/mL (ref >5.9)

## 2018-07-04 MED ORDER — OXYCODONE HCL 5 MG PO TABS: 5.0000 mg | ORAL_TABLET | Freq: Three times a day (TID) | ORAL | 0 refills | Status: DC | PRN

## 2018-07-04 NOTE — Patient Instructions (Signed)
PLEASE FEEL FREE TO CALL OUR OFFICE WITH ANY PROBLEMS OR QUESTIONS ([phone])    WE CAN CONSIDER FOLLOW UP WITH DR. Sima Matas IF YOU WOULD LIKE TO SEE HIM.

## 2018-07-04 NOTE — Patient Instructions (Signed)
Anemia Anemia is a condition in which you do not have enough red blood cells or hemoglobin. Hemoglobin is a substance in red blood cells that carries oxygen. When you do not have enough red blood cells or hemoglobin (are anemic), your body cannot get enough oxygen and your organs may not work properly. As a result, you may feel very tired or have other problems. What are the causes? Common causes of anemia include:  Excessive bleeding. Anemia can be caused by excessive bleeding inside or outside the body, including bleeding from the intestine or from periods in women.  Poor nutrition.  Long-lasting (chronic) kidney, thyroid, and liver disease.  Bone marrow disorders.  Cancer and treatments for cancer.  HIV (human immunodeficiency virus) and AIDS (acquired immunodeficiency syndrome).  Treatments for HIV and AIDS.  Spleen problems.  Blood disorders.  Infections, medicines, and autoimmune disorders that destroy red blood cells.  What are the signs or symptoms? Symptoms of this condition include:  Minor weakness.  Dizziness.  Headache.  Feeling heartbeats that are irregular or faster than normal (palpitations).  Shortness of breath, especially with exercise.  Paleness.  Cold sensitivity.  Indigestion.  Nausea.  Difficulty sleeping.  Difficulty concentrating.  Symptoms may occur suddenly or develop slowly. If your anemia is mild, you may not have symptoms. How is this diagnosed? This condition is diagnosed based on:  Blood tests.  Your medical history.  A physical exam.  Bone marrow biopsy.  Your health care provider may also check your stool (feces) for blood and may do additional testing to look for the cause of your bleeding. You may also have other tests, including:  Imaging tests, such as a CT scan or MRI.  Endoscopy.  Colonoscopy.  How is this treated? Treatment for this condition depends on the cause. If you continue to lose a lot of blood,  you may need to be treated at a hospital. Treatment may include:  Taking supplements of iron, vitamin B12, or folic acid.  Taking a hormone medicine (erythropoietin) that can help to stimulate red blood cell growth.  Having a blood transfusion. This may be needed if you lose a lot of blood.  Making changes to your diet.  Having surgery to remove your spleen.  Follow these instructions at home:  Take over-the-counter and prescription medicines only as told by your health care provider.  Take supplements only as told by your health care provider.  Follow any diet instructions that you were given.  Keep all follow-up visits as told by your health care provider. This is important. Contact a health care provider if:  You develop new bleeding anywhere in the body. Get help right away if:  You are very weak.  You are short of breath.  You have pain in your abdomen or chest.  You are dizzy or feel faint.  You have trouble concentrating.  You have bloody or black, tarry stools.  You vomit repeatedly or you vomit up blood. Summary  Anemia is a condition in which you do not have enough red blood cells or enough of a substance in your red blood cells that carries oxygen (hemoglobin).  Symptoms may occur suddenly or develop slowly.  If your anemia is mild, you may not have symptoms.  This condition is diagnosed with blood tests as well as a medical history and physical exam. Other tests may be needed.  Treatment for this condition depends on the cause of the anemia. This information is not intended to replace advice   given to you by your health care provider. Make sure you discuss any questions you have with your health care provider. Document Released: 09/10/2004 Document Revised: 09/04/2016 Document Reviewed: 09/04/2016 Elsevier Interactive Patient Education  [employer].

## 2018-07-04 NOTE — Progress Notes (Signed)
Subjective:  Patient ID: Katherine [name], female    DOB: April 16, 1956  Age: 62 y.o. MRN: [id number]  CC: Rash; Anemia; and Cough   HPI Katherine [name] presents for f/up -  1.  She complains of a 2-week history of painful rash over her right flank near her breast.  She has had 2 prior episodes of shingles. 2.  She complains of a 2-week history of cough that is intermittently productive of yellow phlegm. 3.  She recently saw her nephrologist and was found to be anemic.  She wants to be screened for vitamin deficiencies.  Outpatient Medications Prior to Visit  Medication Sig Dispense Refill  . acetaminophen (TYLENOL) 500 MG tablet Take 1 tablet (500 mg total) by mouth every 6 (six) hours as needed for moderate pain or headache. (Patient taking differently: Take 1,000 mg by mouth every 6 (six) hours as needed for moderate pain or headache. ) 30 tablet 0  . albuterol (VENTOLIN HFA) 108 (90 Base) MCG/ACT inhaler INHALE 2 PUFFS INTO THE LUNGS EVERY 6 (SIX) HOURS AS NEEDED FOR WHEEZING OR SHORTNESS OF BREATH. (Patient taking differently: Inhale 2 puffs into the lungs every 6 (six) hours as needed for wheezing or shortness of breath. ) 18 Inhaler 2  . azaTHIOprine (IMURAN) 50 MG tablet Take 125 mg by mouth every evening.     . budesonide-formoterol (SYMBICORT) 160-4.5 MCG/ACT inhaler Inhale 2 puffs into the lungs 2 (two) times daily. 2 Inhaler 0  . calcitRIOL (ROCALTROL) 0.25 MCG capsule Take 0.25 mcg by mouth every 3 (three) days.     . carbamazepine (TEGRETOL) 200 MG tablet TAKE 1 TABLET BY MOUTH TWICE A DAY 180 tablet 0  . clopidogrel (PLAVIX) 75 MG tablet Take 1 tablet (75 mg total) by mouth daily. 90 tablet 0  . clotrimazole (MYCELEX) 10 MG troche Take 1 tablet (10 mg total) by mouth 5 (five) times daily. 70 tablet 0  . cyclobenzaprine (FLEXERIL) 10 MG tablet TAKE 1 TABLET BY MOUTH 3 TIMES DAILY AS NEEDED FOR MUSCLE SPASMS (Patient taking differently: Take 10 mg by mouth 2 (two) times daily.  ) 90 tablet 2  . diltiazem (CARDIZEM CD) 120 MG 24 hr capsule TAKE 1 CAPSULE (120 MG TOTAL) BY MOUTH DAILY. NEED OV. (Patient taking differently: Take 120 mg by mouth daily. ) 30 capsule 0  . dofetilide (TIKOSYN) 250 MCG capsule Take 1 capsule (250 mcg total) by mouth 2 (two) times daily. 60 capsule 6  . DULoxetine (CYMBALTA) 30 MG capsule TAKE 2 CAPSULES (60 MG TOTAL) BY MOUTH EVERY MORNING. 180 capsule 2  . famotidine (PEPCID) 20 MG tablet Take 20 mg by mouth daily.    . ferrous sulfate 325 (65 FE) MG tablet Take 1 tablet (325 mg total) by mouth 3 (three) times daily with meals. (Patient taking differently: Take 325 mg by mouth 2 (two) times daily. ) 90 tablet 0  . fluticasone (FLONASE) 50 MCG/ACT nasal spray PLACE 2 SPRAYS INTO BOTH NOSTRILS 2 (TWO) TIMES DAILY. 48 g 0  . glimepiride (AMARYL) 4 MG tablet Take 4 mg by mouth every morning.  6  . guaiFENesin (MUCINEX) 600 MG 12 hr tablet Take 1 tablet (600 mg total) by mouth 2 (two) times daily. (Patient taking differently: Take 600 mg by mouth 2 (two) times daily as needed for cough or to loosen phlegm. ) 30 tablet 0  . Insulin Glargine (BASAGLAR KWIKPEN) 100 UNIT/ML SOPN Inject 12 Units into the skin daily.    [name] Kitchen  LORazepam (ATIVAN) 1 MG tablet Take 1 mg by mouth every morning.     . metFORMIN (GLUCOPHAGE) 500 MG tablet Take 500 mg by mouth 2 (two) times daily.  6  . metoprolol tartrate (LOPRESSOR) 25 MG tablet Take 1 tablet (25 mg total) by mouth 2 (two) times daily. 180 tablet 1  . montelukast (SINGULAIR) 10 MG tablet TAKE 1 TABLET BY MOUTH EVERY DAY 90 tablet 1  . NOVOLOG FLEXPEN 100 UNIT/ML FlexPen Inject 4 Units into the skin 2 (two) times daily as needed for high blood sugar.   6  . omeprazole (PRILOSEC) 40 MG capsule Take 40 mg by mouth every morning.     . predniSONE (DELTASONE) 5 MG tablet Take 5 mg by mouth daily with breakfast.    . promethazine (PHENERGAN) 25 MG tablet Take 1 tablet (25 mg total) by mouth every 6 (six) hours as needed  for nausea or vomiting (or sleep). 15 tablet 0  . rosuvastatin (CRESTOR) 20 MG tablet TAKE 1 TABLET BY MOUTH EVERY DAY 90 tablet 1  . sertraline (ZOLOFT) 100 MG tablet Take 100 mg by mouth every morning.    . warfarin (COUMADIN) 10 MG tablet Take 1 tablet daily or as directed by Coumadin clinic 40 tablet 2  . warfarin (COUMADIN) 2.5 MG tablet TAKE AS DIRECTED BY COUMADIN CLINIC 120 tablet 1  . oxyCODONE (OXY IR/ROXICODONE) 5 MG immediate release tablet Take 1 tablet (5 mg total) by mouth every 8 (eight) hours as needed for severe pain. 60 tablet 0  . nitroGLYCERIN (NITROSTAT) 0.4 MG SL tablet Place 1 tablet (0.4 mg total) under the tongue every 5 (five) minutes as needed for chest pain. 25 tablet 6   No facility-administered medications prior to visit.     ROS Review of Systems  Constitutional: Positive for unexpected weight change (wt gain). Negative for appetite change, diaphoresis and fatigue.  HENT: Negative for facial swelling, sinus pressure, sore throat and trouble swallowing.   Eyes: Negative.   Respiratory: Positive for cough. Negative for chest tightness, shortness of breath and wheezing.   Cardiovascular: Negative for chest pain, palpitations and leg swelling.  Gastrointestinal: Positive for diarrhea, nausea and vomiting. Negative for abdominal distention, abdominal pain, anal bleeding, blood in stool, constipation and rectal pain.  Genitourinary: Negative.  Negative for difficulty urinating and dysuria.  Musculoskeletal: Positive for arthralgias and myalgias. Negative for back pain, joint swelling and neck stiffness.  Skin: Positive for rash. Negative for color change.  Neurological: Negative.  Negative for dizziness, weakness and light-headedness.  Hematological: Negative for adenopathy. Does not bruise/bleed easily.  Psychiatric/Behavioral: Negative.     Objective:  BP 140/70 (BP Location: Left Arm, Patient Position: Sitting, Cuff Size: Large)   Pulse 67   Temp 98.2 F  (36.8 C) (Oral)   Resp 16   Ht 5\' 1"  (1.549 m)   Wt 245 lb (111.1 kg)   SpO2 95%   BMI 46.29 kg/m   BP Readings from Last 3 Encounters:  07/04/18 (!) 159/81  07/04/18 140/70  05/06/18 112/60    Wt Readings from Last 3 Encounters:  07/04/18 245 lb (111.1 kg)  07/04/18 245 lb (111.1 kg)  05/06/18 234 lb 9.6 oz (106.4 kg)    Physical Exam  Constitutional: She is oriented to person, place, and time.  Non-toxic appearance. She does not have a sickly appearance. She does not appear ill. No distress.  HENT:  Mouth/Throat: Oropharynx is clear and moist. No oropharyngeal exudate.  Eyes: Conjunctivae are  normal. No scleral icterus.  Neck: Normal range of motion. Neck supple. No JVD present. No thyromegaly present.  Cardiovascular: Normal rate and regular rhythm. Exam reveals no gallop and no friction rub.  Murmur heard.  Systolic murmur is present with a grade of 1/6.  No diastolic murmur is present. Pulmonary/Chest: Effort normal and breath sounds normal. No respiratory distress. She has no wheezes. She has no rales.  Abdominal: Soft. Normal appearance and bowel sounds are normal. She exhibits no mass. There is no hepatosplenomegaly. There is no tenderness.  Genitourinary: Rectum normal. Rectal exam shows no external hemorrhoid, no internal hemorrhoid, no fissure, no mass, no tenderness, anal tone normal and guaiac negative stool. Pelvic exam was performed with patient supine.  Musculoskeletal: Normal range of motion. She exhibits no edema, tenderness or deformity.  Lymphadenopathy:    She has no cervical adenopathy.  Neurological: She is alert and oriented to person, place, and time.  Skin: Skin is warm and dry. Rash noted. She is not diaphoretic. No pallor.  Cluster of excoriated erythematous macules over the right mid flank.  There are no vesicles, pustules, streaking, induration, or exudate.  Vitals reviewed.   Lab Results  Component Value Date   WBC 7.6 07/04/2018   HGB 11.9  (L) 07/04/2018   HCT 35.5 (L) 07/04/2018   PLT 237.0 07/04/2018   GLUCOSE 196 (H) 05/06/2018   CHOL 111 11/30/2017   TRIG 129.0 11/30/2017   HDL 53.90 11/30/2017   LDLCALC 32 11/30/2017   ALT 17 02/03/2018   AST 24 02/03/2018   NA 136 05/06/2018   K 5.3 (H) 05/06/2018   CL 103 05/06/2018   CREATININE 0.98 05/06/2018   BUN 13 05/06/2018   CO2 24 05/06/2018   TSH 3.118 04/20/2018   INR 2.4 07/04/2018   HGBA1C 7.0 (H) 07/04/2018   MICROALBUR 30 11/30/2017    No results found.  Assessment & Plan:   Katherine [name] was seen today for rash, anemia and cough.  Diagnoses and all orders for this visit:  Herpes zoster without complication- The infection has resolved.  The areas are healing nicely with no complications.  She has oxycodone for relief of the pain.  I have encouraged her to get vaccinated against shingles.  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  I will screen her for vitamin deficiencies.  If she does not have any vitamin deficiencies that I will attribute to this to the anemia of chronic disease/renal insufficiency. -     CBC with Differential/Platelet; Future -     IBC panel; Future -     Vitamin B12; Future -     Folate; Future -     Ferritin; Future -     Vitamin B1; Future  Type 2 diabetes mellitus with peripheral neuropathy ([place])- Her A1c is at 7.0%.  Her blood sugars are adequately well controlled. -     Hemoglobin A1c; Future  Cough- Her chest x-ray is negative for pneumonia.  She has a paucity of symptoms so I do not think she has a bacterial infection.  Will attribute this to a viral URI.  If her symptoms do not resolve soon or if she develops new or worsening symptoms then will consider antibiotic therapy. -     DG Chest 2 View; Future  Need for shingles vaccine -     Varicella-zoster vaccine IM (Shingrix)   I am having Katherine [name] maintain her omeprazole, LORazepam, azaTHIOprine, calcitRIOL, nitroGLYCERIN, sertraline, glimepiride, metFORMIN,  acetaminophen, ferrous sulfate, clotrimazole,  diltiazem, albuterol, famotidine, budesonide-formoterol, predniSONE, cyclobenzaprine, montelukast, DULoxetine, BASAGLAR KWIKPEN, NOVOLOG FLEXPEN, guaiFENesin, promethazine, metoprolol tartrate, rosuvastatin, dofetilide, clopidogrel, warfarin, fluticasone, carbamazepine, and warfarin.  No orders of the defined types were placed in this encounter.    Follow-up: Return in about 4 weeks (around 08/01/2018).  Scarlette Calico, MD

## 2018-07-04 NOTE — Progress Notes (Signed)
Subjective:    Patient ID: Katherine [name], female    DOB: Jan 29, 1956, 62 y.o.   MRN: [id number]  HPI   Katherine [name] is here in follow up of her chronic pain. She came down with the shingles and apparently two weeks ago. She has had ongoing pain in her low back and legs similar to that which we discussed at her last visit.   She remains on oxycodone for more severe pain. Additionally, she's on tegretol, cymbalta.   Katherine [name] is followed by cardiology for her atrial fib and is on coumadin and plavix, tikosyn most recently.    Pain Inventory Average Pain 8 Pain Right Now 7 My pain is sharp, burning, tingling and aching  In the last 24 hours, has pain interfered with the following? General activity 7 Relation with others 7 Enjoyment of life 7 What TIME of day is your pain at its worst? morning, night Sleep (in general) Fair  Pain is worse with: walking, bending, standing and some activites Pain improves with: rest, heat/ice and medication Relief from Meds: 8  Mobility walk without assistance walk with assistance use a cane use a walker how many minutes can you walk? 5 ability to climb steps?  yes do you drive?  yes Do you have any goals in this area?  yes  Function disabled: date disabled . I need assistance with the following:  meal prep, household duties and shopping  Neuro/Psych weakness numbness tingling trouble walking spasms depression anxiety  Prior Studies Any changes since last visit?  no  Physicians involved in your care Any changes since last visit?  no   Family History  Problem Relation Age of Onset  . Cancer Mother        liver  . Heart disease Father   . Cancer Father        colon  . Arrhythmia Brother        Atrial Fibrillation  . Arrhythmia Paternal Aunt        Atrial Fibrillation   Social History   Socioeconomic History  . Marital status: Married    Spouse name: Not on file  . Number of children: Not on file  . Years of education:  Not on file  . Highest education level: Not on file  Occupational History  . Not on file  Social Needs  . Financial resource strain: Not on file  . Food insecurity:    Worry: Not on file    Inability: Not on file  . Transportation needs:    Medical: Not on file    Non-medical: Not on file  Tobacco Use  . Smoking status: Former Smoker    Packs/day: 1.00    Years: 30.00    Pack years: 30.00    Types: Cigarettes    Last attempt to quit: 08/17/2002    Years since quitting: 15.8  . Smokeless tobacco: Never Used  Substance and Sexual Activity  . Alcohol use: No  . Drug use: No  . Sexual activity: Not on file  Lifestyle  . Physical activity:    Days per week: Not on file    Minutes per session: Not on file  . Stress: Not on file  Relationships  . Social connections:    Talks on phone: Not on file    Gets together: Not on file    Attends religious service: Not on file    Active member of club or organization: Not on file    Attends meetings of clubs  or organizations: Not on file    Relationship status: Not on file  Other Topics Concern  . Not on file  Social History Narrative   She is currently married, and the caregiver of her husband who is recovering from surgery for tongue cancer now diagnosed with lung cancer. Prior to his diagnosis of her husband, she actually had adopted a 25-year-old child who she knows caring for as well. With all the surgeries, they have been quite financially troubled. Thanks the help of her community and church, they have been able to stay "alfoat."     She is a former smoker who quit in 2004 after a 30-pack-year history.   She is active chasing a 57-year-old child, does not do routine exercise. She's been quite depressed with the condition of her husband, and admits to eating comfort herself.   She does not drink alcohol.   Past Surgical History:  Procedure Laterality Date  . ABDOMINAL AORTOGRAM N/A 04/21/2018   Procedure: ABDOMINAL AORTOGRAM;   Surgeon: [name], MD;  Location: [facility];  Service: Cardiovascular;  Laterality: N/A;  . CARDIAC CATHETERIZATION  5/'01, 3/'02, 8/'03, 10/'04; 1/'15   08/22/2013: LAD & RCA 100%; LIMA-LAD & SVG-rPDA patent; Cx-- OM1 60%, OM2 ostial ~50%; SVG-D1 - 80% mid, 50% distal ISR --PCI  . CATHETER REMOVAL    . CHOLECYSTECTOMY N/A 10/29/2014   Procedure: LAPAROSCOPIC CHOLECYSTECTOMY WITH INTRAOPERATIVE CHOLANGIOGRAM;  Surgeon: Excell Seltzer, MD;  Location: [facility];  Service: General;  Laterality: N/A;  . CORONARY ANGIOPLASTY  1994   x5  . CORONARY ARTERY BYPASS GRAFT  1995   LIMA-LAD, SVG-RPDA, SVG-D1  . ESOPHAGOGASTRODUODENOSCOPY N/A 10/15/2016   Procedure: ESOPHAGOGASTRODUODENOSCOPY (EGD);  Surgeon: Wilford Corner, MD;  Location: [facility];  Service: Endoscopy;  Laterality: N/A;  . I&D EXTREMITY Right 01/29/2018   Procedure: IRRIGATION AND DEBRIDEMENT THUMB;  Surgeon: Dayna Barker, MD;  Location: [facility];  Service: Plastics;  Laterality: Right;  . INCISE AND DRAIN ABCESS    . KIDNEY TRANSPLANT  1991  . KNEE ARTHROSCOPY WITH LATERAL MENISECTOMY Left 12/03/2017   Procedure: LEFT KNEE ARTHROSCOPY WITH LATERAL MENISECTOMY;  Surgeon: Earlie Server, MD;  Location: [facility];  Service: Orthopedics;  Laterality: Left;  . LAPAROSCOPIC GASTRIC BANDING  04/2004; 10/'09, 2/'10   Port Replacement x 2  . LEFT HEART CATH AND CORS/GRAFTS ANGIOGRAPHY N/A 04/21/2018   Procedure: LEFT HEART CATH AND CORS/GRAFTS ANGIOGRAPHY;  Surgeon: [name], MD;  Location: [facility];  Service: Cardiovascular;  Laterality: N/A;  . LEFT HEART CATHETERIZATION WITH CORONARY/GRAFT ANGIOGRAM N/A 08/23/2013   Procedure: LEFT HEART CATHETERIZATION WITH Beatrix Fetters;  Surgeon: Wellington Hampshire, MD;  Location: [facility];  Service: Cardiovascular;  Laterality: N/A;  . Lower Extremity Arterial Dopplers  08/2013   ABI: R 0.96, L 1.04  . MULTIPLE TOOTH EXTRACTIONS  age 54  . NM MYOVIEW LTD  03/2016    EF 62%. LOW RISK. C/W prior MI - no Ischemia. Apical hypokinesis.  [name] Kitchen PERCUTANEOUS CORONARY STENT INTERVENTION (PCI-S)  5/'01, 3/'02, 8/'03, 10/'04;   '01 - S660 BMS 2.5 x 9 - dSVG-D1 into D1; '02- post-stent stenosis - 2.5 x 8 Pixel BMS; '8\03: ISR/Thrombosis into native D1 - AngioJet, 2.5 x 13 Pixel; '04 - ISR 95% - covered stented area with Taxus DES 2.5 mm x 20 (2.88)  . PERCUTANEOUS CORONARY STENT INTERVENTION (PCI-S)  08/23/2013   Procedure: PERCUTANEOUS CORONARY STENT INTERVENTION (PCI-S);  Surgeon: Wellington Hampshire, MD;  Location:  [facility];  Service: Cardiovascular;;mid SVG-D1 80%; distal stent ~50% ISR; Promus Prermier DES 2.75 mm xc 20 mm (2.8 mm)  . PORT-A-CATH REMOVAL     kidney  . TRANSTHORACIC ECHOCARDIOGRAM  02/2016   EF 55-60%. Septal dyssynergy from CABG GR 2 DD. Aortic valve is trileaflet the functional bicuspid with sclerosis but not yet notable for stenosis stenosis.; no Significant change  . TUBAL LIGATION    . wrist fistula repair Left    dialysis for one year   Past Medical History:  Diagnosis Date  . Anemia   . Anxiety   . Bilateral carotid artery stenosis    Carotid duplex 01/2228: 7-98% LICA, 92-11% RICA, >94% RECA, f/u 1 yr suggested  . CAD (coronary artery disease) of bypass graft 5/01; 3/'02, 8/'03, 10/'04; 1/15   PCI x 5 to SVG-D1   . CAD in native artery 07/1993    3 Vessel Disease (LAD-D1 & RCA) -- CABG  . CAD S/P percutaneous coronary angioplasty    PCI to SVG-D1 insertion/native D1 x 4 = '01 -(S660 BMS 2.5 x 9 - insertion into D1; '02 - distal overlap ACS Pixel 2.5 x 8  BMS; '03 distal/native ISR/Thrombosis - Pixel 2.5 x 13; '04 - ISR-  Taxus 2.5 x 20 (covered all);; 1/15 - mid SVG-D1 (50% distal ISR) - Promus P 2.75 x 20 -- 2.8 mm  . COPD mixed type ([place])    Followed by Dr. Lamonte Sakai "pulmonologist said no COPD"  . Depression   . Depression with anxiety   . Diabetes mellitus type 2 in obese ([place])   . Diarrhea    started after cholecystectomy and mass  removed from intestine  . Dyslipidemia, goal LDL below 70    08/2012: TC 137, TG 200, HDL 32!, LDL 45; on statin (followed by Dr.Deterding)  . ESRD (end stage renal disease) ([place]) 1991   s/p Cadaveric Renal Transplant [facility] - Dr. Jimmy Footman)   . Family history of adverse reaction to anesthesia    mom's bp dropped during/after anesthesia  . Fibromyalgia   . GERD (gastroesophageal reflux disease)   . Glomerulonephritis, chronic, rapidly progressive 59  . 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 07/1993   Rescue PTCA of RCA -- referred for CABG.  . H/O: GI bleed   . Headache    migraines in the past  . History of kidney stones   . Hypertension associated with diabetes ([place])   . MRSA (methicillin resistant staph aureus) culture positive   . Obesity    s/p Lap Band 04/2004- Port Replacement 10/09 & 2/10 for infection; Dr. Excell Seltzer.  . OSA (obstructive sleep apnea)    no longer on CPAP or home O2, states she doesn't need now after lap band  . PAD (peripheral artery disease) ([place]) 08/2013   LEA Dopplers to be read by Dr. Fletcher Anon  . PAF (paroxysmal atrial fibrillation) ([place]) 06/2014   Noted on CardioNet Monitor  - rate ~112; no recurrent symptoms. Nonionic regulation because of frequent GI procedures. Patient prefers Plavix  . Pneumonia   . Recurrent boils    Bilateral Groin  . Rheumatoid arthritis ([place])    Per Patient Report; associated with OA  . 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 Servando Snare: LIMA-LAD, SVG-bifurcatingD1, SVG-rPDA  . S/p cadaver renal transplant [place]  . Stroke [facility]) 2012   "right eye stroke- half blind now"  . Unstable angina ([place]) 5/01; 3/'02, 8/'03, 10/'04; 1/15   x  5 occurences since Inf-Post STEMI in 1994   BP (!) 159/81 (BP Location: Right Wrist, Patient Position: Sitting, Cuff Size: Normal)   Pulse 77   Resp 14   Ht 5\' 1"  (1.549 m) Comment: patient reported  Wt 245 lb (111.1 kg) Comment: patient reported  SpO2 94%   BMI 46.29 kg/m     Opioid Risk Score:   Fall Risk Score:  `1  Depression screen PHQ 2/9  Depression screen [facility] 2/9 03/29/2017 05/19/2016 03/30/2016  Decreased Interest 3 3 3   Down, Depressed, Hopeless 3 3 3   PHQ - 2 Score 6 6 6   Altered sleeping - - 3  Tired, decreased energy - - 3  Change in appetite - - 2  Feeling bad or failure about yourself  - - 3  Trouble concentrating - - 2  Moving slowly or fidgety/restless - - 0  Suicidal thoughts - - 0  PHQ-9 Score - - 19  Some recent data might be hidden    Review of Systems  Constitutional: Positive for chills, fever and unexpected weight change.  HENT: Negative.   Eyes: Negative.   Respiratory: Positive for cough, shortness of breath and wheezing.   Gastrointestinal: Positive for abdominal pain, diarrhea and nausea.  Endocrine:       High blood sugar  Musculoskeletal: Positive for arthralgias, back pain, gait problem, myalgias and neck pain.         Spasms    Skin: Positive for rash.  Allergic/Immunologic: Negative.   Neurological: Positive for weakness and numbness.       Tingling  Psychiatric/Behavioral: Positive for dysphoric mood. The patient is nervous/anxious.        Objective:   Physical Exam  General: No acute distress HEENT: EOMI, oral membranes moist Cards: reg rate  Chest: normal effort Abdomen: Soft, NT, ND Skin: dry, intact Extremities: no edema    Neuro:CN normal. Motor 5/5. No sensory loss Musculoskeletal:antaligc gai. Low back rom limited. LB TTP, right hand dressed, difficulty with grip Psych:pleasant       Assessment & Plan:  1. Fibromyalgia 2. Rheumatoid Arthritis by history 3. Osteoarthritis--polyarticular, bilateral knees quite involved, left more than right 4. ESRD with hx of renal transplant on chronic steroids 5. Right rotator cuff tendonitis/like degenerative/inflammatory changes in the right shoulder also.  6. Hx o falls,gait disorder 7. Right low back pain with radicular symptoms.  ?spondylosis per recent plain film 8. SCC of right thumb 9. Shingles right lower back.   Plan: 1. Continues to be a fall riskdue to her balance, pain.Needs to continue with walker at all times. 2. oxycodone 5mg  q8 prn, #60--Second rx was provided for October -We will continue the controlled substance monitoring program, this consists of regular clinic visits, examinations, routine drug screening, pill counts as well as use of [place] Controlled Substance Reporting System. [place].   -UDS today 3. Continuecymbalta to 60mg  daily,continuezoloftat100mg as well    4. Continue tegretol200mg  BID for FMS/neuropathic pain 6.Left knee mgt per Dr. French Ana  8.Still can consider injections to her lumbar spine but given the recent issues with her atrial fibrillation and the fact that she is on Coumadin/plavix, that option is on hold.  9.  Patient continues to struggle with coping related to the loss of her brother as well as illness of her husband and sister.  She also has fears regarding her own health.  She would like to see Dr. Sima Matas to discuss better coping skills and better ways to deal  with her anxiety.    75minutes of face to face patient care time were spent during this visit. All questions were encouraged and answered.  Follow up in 2 months.

## 2018-07-04 NOTE — Patient Instructions (Addendum)
Description   Continue taking 10mg  daily except 15mg  on Wednesdays and Saturdays. Pt completed the 2.5mg  tablets & now using 10mg  tablet.  Recheck in 3 weeks. [facility] [phone] call with procedures or new medications.

## 2018-07-06 ENCOUNTER — Telehealth: Payer: Self-pay

## 2018-07-06 NOTE — Telephone Encounter (Signed)
Copied from [place] [id number]. Topic: General - Other >> Jul 05, 2018  9:29 AM Yvette Rack wrote: Reason for CRM: Pt states she is having problems with her phone so she would like to be reached at [phone] for the test results. >> Jul 06, 2018  1:40 PM Wynetta Emery, [place] C wrote: Pt called back in requesting to speak with Stefanie. Pt says that she doesn't feel good and would like to know if everything is okay with her results? I did advise that results has not been reviewed by PCP and that I would have CMA to call her as soon as they are to go over results with her.

## 2018-07-06 NOTE — Telephone Encounter (Signed)
Her chest x-ray was normal.  Her labs are okay/stable.  What does she mean when she says she does not feel well?

## 2018-07-06 NOTE — Telephone Encounter (Signed)
Can you results labs to me please?

## 2018-07-07 NOTE — Telephone Encounter (Signed)
Called pt and informed of PCP

## 2018-07-09 LAB — TOXASSURE SELECT,+ANTIDEPR,UR

## 2018-07-09 LAB — VITAMIN B1: Vitamin B1 (Thiamine): 10 nmol/L (ref 8–30)

## 2018-07-12 ENCOUNTER — Telehealth: Payer: Self-pay | Admitting: *Deleted

## 2018-07-12 NOTE — Telephone Encounter (Signed)
Urine drug screen for this encounter is consistent for prescribed medication 

## 2018-07-18 ENCOUNTER — Other Ambulatory Visit: Payer: Self-pay | Admitting: *Deleted

## 2018-07-18 MED ORDER — BUDESONIDE-FORMOTEROL FUMARATE 160-4.5 MCG/ACT IN AERO: 2.0000 | INHALATION_SPRAY | Freq: Two times a day (BID) | RESPIRATORY_TRACT | 0 refills | Status: DC

## 2018-07-18 MED ORDER — MONTELUKAST SODIUM 10 MG PO TABS: ORAL_TABLET | ORAL | 0 refills | Status: DC

## 2018-07-19 DIAGNOSIS — R399 Unspecified symptoms and signs involving the genitourinary system: Secondary | ICD-10-CM | POA: Diagnosis not present

## 2018-08-01 ENCOUNTER — Telehealth: Payer: Self-pay

## 2018-08-01 DIAGNOSIS — M47816 Spondylosis without myelopathy or radiculopathy, lumbar region: Secondary | ICD-10-CM

## 2018-08-01 NOTE — Telephone Encounter (Signed)
Pt called stating she fell getting out of the tube and wanted to get an order for railing for her tube and shower.

## 2018-08-01 NOTE — Telephone Encounter (Signed)
Sorry to here that. I wrote an order for DME (shower rail) in epic

## 2018-08-02 NOTE — Telephone Encounter (Signed)
Pt notified DME has been ordered

## 2018-08-03 NOTE — Progress Notes (Deleted)
@Patient  ID: Katherine [name], female    DOB: 11-20-1955, 62 y.o.   MRN: [id number]  No chief complaint on file.   Referring provider: [name], MD  HPI:   PMH:  Smoker/ Smoking History:  Maintenance:   Pt of: Dr. Lamonte Sakai  62 year old female patient has a known history of obesity, status post lap band surgery with significant weight loss. Patient also has a history of renal transplant on chronic immunosuppression with Imuran and prednisone. The patient has a history of sleep apnea now much improved after significant weight loss.  Still needs repeat PSG to prove that OSA has resolved. She has stopped using CPAP.   Recent [facility] Encounters:     08/03/2018  - Visit   HPI  Tests:    FENO:  No results found for: NITRICOXIDE  PFT: PFT Results Latest Ref Rng & Units 07/17/2016  FVC-Pre L 2.51  FVC-Predicted Pre % 84  FVC-Post L 2.53  FVC-Predicted Post % 85  Pre FEV1/FVC % % 80  Post FEV1/FCV % % 86  FEV1-Pre L 2.02  FEV1-Predicted Pre % 87  FEV1-Post L 2.17  DLCO UNC% % 68  DLCO COR %Predicted % 86  TLC L 3.91  TLC % Predicted % 84  RV % Predicted % 75    Imaging: No results found.    Specialty Problems      Pulmonary Problems   Extrinsic asthma    Qualifier: Diagnosis of  By: Ronnald Ramp CNA/MA, Jessica        Sleep apnea    Qualifier: Diagnosis of  By: Ronnald Ramp CNA/MA, Jessica        Chronic allergic rhinitis      Allergies  Allergen Reactions  . Tetracycline Hives  . Hydromorphone Hcl Nausea And Vomiting  . Niacin Other (See Comments)    Mouth blisters  . Niaspan [Niacin Er] Other (See Comments)    Mouth blisters  . Sulfa Antibiotics Nausea Only and Other (See Comments)    "Tears up stomach"  . Sulfonamide Derivatives Other (See Comments)    Reaction: per patient "tears her stomach up"  . Codeine Nausea And Vomiting  . Erythromycin Nausea And Vomiting  . Hydromorphone Hcl Nausea And Vomiting  . Morphine And Related Nausea  And Vomiting  . Nalbuphine Nausea And Vomiting  . Sulfasalazine Nausea Only and Other (See Comments)    per patient "tears her stomach up", "Tears up stomach"  . Tape Rash and Other (See Comments)    No "plastic" tape," please    Immunization History  Administered Date(s) Administered  . Influenza Split 05/11/2012, 05/17/2016, 05/17/2017  . Influenza Whole 06/17/2009, 06/17/2010, 04/18/2011  . Influenza,inj,Quad PF,6+ Mos 08/24/2013, 05/20/2015, 04/21/2018  . Pneumococcal Polysaccharide-23 11/30/2017  . Tdap 11/30/2017  . Zoster Recombinat (Shingrix) 07/04/2018    Past Medical History:  Diagnosis Date  . Anemia   . Anxiety   . Bilateral carotid artery stenosis    Carotid duplex 10/7104: 2-69% LICA, 48-54% RICA, >62% RECA, f/u 1 yr suggested  . CAD (coronary artery disease) of bypass graft 5/01; 3/'02, 8/'03, 10/'04; 1/15   PCI x 5 to SVG-D1   . CAD in native artery 07/1993    3 Vessel Disease (LAD-D1 & RCA) -- CABG  . CAD S/P percutaneous coronary angioplasty    PCI to SVG-D1 insertion/native D1 x 4 = '01 -(S660 BMS 2.5 x 9 - insertion into D1; '02 - distal overlap ACS Pixel 2.5 x 8  BMS; '03 distal/native  ISR/Thrombosis - Pixel 2.5 x 13; '04 - ISR-  Taxus 2.5 x 20 (covered all);; 1/15 - mid SVG-D1 (50% distal ISR) - Promus P 2.75 x 20 -- 2.8 mm  . COPD mixed type ([place])    Followed by Dr. Lamonte Sakai "pulmonologist said no COPD"  . Depression   . Depression with anxiety   . Diabetes mellitus type 2 in obese ([place])   . Diarrhea    started after cholecystectomy and mass removed from intestine  . Dyslipidemia, goal LDL below 70    08/2012: TC 137, TG 200, HDL 32!, LDL 45; on statin (followed by Dr.Deterding)  . ESRD (end stage renal disease) ([place]) 1991   s/p Cadaveric Renal Transplant [facility] - Dr. Jimmy Footman)   . Family history of adverse reaction to anesthesia    mom's bp dropped during/after anesthesia  . Fibromyalgia   . GERD (gastroesophageal reflux disease)   . Glomerulonephritis,  chronic, rapidly progressive 84  . 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 07/1993   Rescue PTCA of RCA -- referred for CABG.  . H/O: GI bleed   . Headache    migraines in the past  . History of kidney stones   . Hypertension associated with diabetes ([place])   . MRSA (methicillin resistant staph aureus) culture positive   . Obesity    s/p Lap Band 04/2004- Port Replacement 10/09 & 2/10 for infection; Dr. Excell Seltzer.  . OSA (obstructive sleep apnea)    no longer on CPAP or home O2, states she doesn't need now after lap band  . PAD (peripheral artery disease) ([place]) 08/2013   LEA Dopplers to be read by Dr. Fletcher Anon  . PAF (paroxysmal atrial fibrillation) ([place]) 06/2014   Noted on CardioNet Monitor  - rate ~112; no recurrent symptoms. Nonionic regulation because of frequent GI procedures. Patient prefers Plavix  . Pneumonia   . Recurrent boils    Bilateral Groin  . Rheumatoid arthritis ([place])    Per Patient Report; associated with OA  . 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 Servando Snare: LIMA-LAD, SVG-bifurcatingD1, SVG-rPDA  . S/p cadaver renal transplant [place]  . Stroke [facility]) 2012   "right eye stroke- half blind now"  . Unstable angina ([place]) 5/01; 3/'02, 8/'03, 10/'04; 1/15   x 5 occurences since Inf-Post STEMI in 1994    Tobacco History: Social History   Tobacco Use  Smoking Status Former Smoker  . Packs/day: 1.00  . Years: 30.00  . Pack years: 30.00  . Types: Cigarettes  . Last attempt to quit: 08/17/2002  . Years since quitting: 15.9  Smokeless Tobacco Never Used   Counseling given: Not Answered   Outpatient Encounter Medications as of 08/04/2018  Medication Sig  . acetaminophen (TYLENOL) 500 MG tablet Take 1 tablet (500 mg total) by mouth every 6 (six) hours as needed for moderate pain or headache. (Patient taking differently: Take 1,000 mg by mouth every 6 (six) hours as needed for moderate pain or headache. )  . albuterol (VENTOLIN HFA) 108 (90 Base)  MCG/ACT inhaler INHALE 2 PUFFS INTO THE LUNGS EVERY 6 (SIX) HOURS AS NEEDED FOR WHEEZING OR SHORTNESS OF BREATH. (Patient taking differently: Inhale 2 puffs into the lungs every 6 (six) hours as needed for wheezing or shortness of breath. )  . azaTHIOprine (IMURAN) 50 MG tablet Take 125 mg by mouth every evening.   . budesonide-formoterol (SYMBICORT) 160-4.5 MCG/ACT inhaler Inhale 2 puffs into the lungs 2 (two) times daily.  [name] Kitchen  calcitRIOL (ROCALTROL) 0.25 MCG capsule Take 0.25 mcg by mouth every 3 (three) days.   . carbamazepine (TEGRETOL) 200 MG tablet TAKE 1 TABLET BY MOUTH TWICE A DAY  . clopidogrel (PLAVIX) 75 MG tablet Take 1 tablet (75 mg total) by mouth daily.  . clotrimazole (MYCELEX) 10 MG troche Take 1 tablet (10 mg total) by mouth 5 (five) times daily.  . cyclobenzaprine (FLEXERIL) 10 MG tablet TAKE 1 TABLET BY MOUTH 3 TIMES DAILY AS NEEDED FOR MUSCLE SPASMS (Patient taking differently: Take 10 mg by mouth 2 (two) times daily. )  . diltiazem (CARDIZEM CD) 120 MG 24 hr capsule TAKE 1 CAPSULE (120 MG TOTAL) BY MOUTH DAILY. NEED OV. (Patient taking differently: Take 120 mg by mouth daily. )  . dofetilide (TIKOSYN) 250 MCG capsule Take 1 capsule (250 mcg total) by mouth 2 (two) times daily.  . DULoxetine (CYMBALTA) 30 MG capsule TAKE 2 CAPSULES (60 MG TOTAL) BY MOUTH EVERY MORNING.  . famotidine (PEPCID) 20 MG tablet Take 20 mg by mouth daily.  . ferrous sulfate 325 (65 FE) MG tablet Take 1 tablet (325 mg total) by mouth 3 (three) times daily with meals. (Patient taking differently: Take 325 mg by mouth 2 (two) times daily. )  . fluticasone (FLONASE) 50 MCG/ACT nasal spray PLACE 2 SPRAYS INTO BOTH NOSTRILS 2 (TWO) TIMES DAILY.  [name] Kitchen glimepiride (AMARYL) 4 MG tablet Take 4 mg by mouth every morning.  [name] Kitchen guaiFENesin (MUCINEX) 600 MG 12 hr tablet Take 1 tablet (600 mg total) by mouth 2 (two) times daily. (Patient taking differently: Take 600 mg by mouth 2 (two) times daily as needed for cough or to  loosen phlegm. )  . Insulin Glargine (BASAGLAR KWIKPEN) 100 UNIT/ML SOPN Inject 12 Units into the skin daily.  [name] Kitchen LORazepam (ATIVAN) 1 MG tablet Take 1 mg by mouth every morning.   . metFORMIN (GLUCOPHAGE) 500 MG tablet Take 500 mg by mouth 2 (two) times daily.  . metoprolol tartrate (LOPRESSOR) 25 MG tablet Take 1 tablet (25 mg total) by mouth 2 (two) times daily.  . montelukast (SINGULAIR) 10 MG tablet TAKE 1 TABLET BY MOUTH EVERY DAY  . nitroGLYCERIN (NITROSTAT) 0.4 MG SL tablet Place 1 tablet (0.4 mg total) under the tongue every 5 (five) minutes as needed for chest pain.  [name] Kitchen NOVOLOG FLEXPEN 100 UNIT/ML FlexPen Inject 4 Units into the skin 2 (two) times daily as needed for high blood sugar.   . omeprazole (PRILOSEC) 40 MG capsule Take 40 mg by mouth every morning.   [name] Kitchen oxyCODONE (OXY IR/ROXICODONE) 5 MG immediate release tablet Take 1 tablet (5 mg total) by mouth every 8 (eight) hours as needed for severe pain.  . predniSONE (DELTASONE) 5 MG tablet Take 5 mg by mouth daily with breakfast.  . promethazine (PHENERGAN) 25 MG tablet Take 1 tablet (25 mg total) by mouth every 6 (six) hours as needed for nausea or vomiting (or sleep).  . rosuvastatin (CRESTOR) 20 MG tablet TAKE 1 TABLET BY MOUTH EVERY DAY  . sertraline (ZOLOFT) 100 MG tablet Take 100 mg by mouth every morning.  . warfarin (COUMADIN) 10 MG tablet Take 1 tablet daily or as directed by Coumadin clinic  . warfarin (COUMADIN) 2.5 MG tablet TAKE AS DIRECTED BY COUMADIN CLINIC   No facility-administered encounter medications on file as of 08/04/2018.      Review of Systems  Review of Systems   Physical Exam  There were no vitals taken for this visit.  Wt  Readings from Last 5 Encounters:  07/04/18 245 lb (111.1 kg)  07/04/18 245 lb (111.1 kg)  05/06/18 234 lb 9.6 oz (106.4 kg)  05/03/18 234 lb 9.6 oz (106.4 kg)  04/26/18 230 lb 14.4 oz (104.7 kg)     Physical Exam    Lab Results:  CBC    Component Value Date/Time    WBC 7.6 07/04/2018 1146   RBC 3.86 (L) 07/04/2018 1146   HGB 11.9 (L) 07/04/2018 1146   HCT 35.5 (L) 07/04/2018 1146   PLT 237.0 07/04/2018 1146   MCV 91.8 07/04/2018 1146   MCH 31.4 04/25/2018 0543   MCHC 33.6 07/04/2018 1146   RDW 16.7 (H) 07/04/2018 1146   LYMPHSABS 1.1 07/04/2018 1146   MONOABS 0.7 07/04/2018 1146   EOSABS 0.1 07/04/2018 1146   BASOSABS 0.0 07/04/2018 1146    BMET    Component Value Date/Time   NA 136 05/06/2018 1127   NA 134 (A) 01/04/2018   K 5.3 (H) 05/06/2018 1127   CL 103 05/06/2018 1127   CO2 24 05/06/2018 1127   GLUCOSE 196 (H) 05/06/2018 1127   BUN 13 05/06/2018 1127   BUN 17 01/04/2018   CREATININE 0.98 05/06/2018 1127   CREATININE 0.90 06/26/2014 1108   CALCIUM 9.3 05/06/2018 1127   GFRNONAA >60 05/06/2018 1127   GFRAA >60 05/06/2018 1127    BNP No results found for: BNP  ProBNP    Component Value Date/Time   PROBNP 139.0 (H) 02/07/2016 1224      Assessment & Plan:     No problem-specific Assessment & Plan notes found for this encounter.     Lauraine Rinne, NP 08/03/2018   This appointment was *** with over 50% of the time in direct face-to-face patient care, assessment, plan of care, and follow-up.

## 2018-08-04 ENCOUNTER — Encounter: Payer: Self-pay | Admitting: Pulmonary Disease

## 2018-08-04 ENCOUNTER — Ambulatory Visit: Payer: Medicare Other | Admitting: Pulmonary Disease

## 2018-08-04 ENCOUNTER — Ambulatory Visit (INDEPENDENT_AMBULATORY_CARE_PROVIDER_SITE_OTHER): Payer: Medicare Other | Admitting: Pulmonary Disease

## 2018-08-04 ENCOUNTER — Ambulatory Visit (INDEPENDENT_AMBULATORY_CARE_PROVIDER_SITE_OTHER): Payer: Medicare Other | Admitting: Pharmacist

## 2018-08-04 VITALS — BP 120/66 | HR 82 | Temp 97.8°F | Ht 61.0 in | Wt 245.0 lb

## 2018-08-04 DIAGNOSIS — I480 Paroxysmal atrial fibrillation: Secondary | ICD-10-CM

## 2018-08-04 DIAGNOSIS — J309 Allergic rhinitis, unspecified: Secondary | ICD-10-CM

## 2018-08-04 DIAGNOSIS — Z7901 Long term (current) use of anticoagulants: Secondary | ICD-10-CM | POA: Diagnosis not present

## 2018-08-04 DIAGNOSIS — J0100 Acute maxillary sinusitis, unspecified: Secondary | ICD-10-CM | POA: Insufficient documentation

## 2018-08-04 DIAGNOSIS — J45909 Unspecified asthma, uncomplicated: Secondary | ICD-10-CM

## 2018-08-04 DIAGNOSIS — Z9884 Bariatric surgery status: Secondary | ICD-10-CM | POA: Diagnosis not present

## 2018-08-04 LAB — POCT INR: INR: 4.5 — AB (ref 2.0–3.0)

## 2018-08-04 MED ORDER — DOXYCYCLINE HYCLATE 100 MG PO TABS: 100.0000 mg | ORAL_TABLET | Freq: Two times a day (BID) | ORAL | 0 refills | Status: DC

## 2018-08-04 MED ORDER — PREDNISONE 10 MG PO TABS: ORAL_TABLET | ORAL | 0 refills | Status: DC

## 2018-08-04 NOTE — Patient Instructions (Signed)
Doxycycline >>> 1 100 mg tablet every 12 hours for 7 days >>>take with food  >>>wear sunscreen   Talk with coumadin clinic about new antibiotic   Prednisone 10mg  tablet  >>>Take 2 tablets (20 mg total) daily for the next 5 days >>> Take with food in the morning  Continue flonase  Start nasal saline rinses   Continue Symbicort 160 >>> 2 puffs in the morning right when you wake up, rinse out your mouth after use, 12 hours later 2 puffs, rinse after use >>> Take this daily, no matter what >>> This is not a rescue inhaler   Follow-up with our office if symptoms are not improving  Follow-up with Dr. Lamonte Sakai in 2 to 3 months  It is flu season:   >>>Remember to be washing your hands regularly, using hand sanitizer, be careful to use around herself with has contact with people who are sick will increase her chances of getting sick yourself. >>> Best ways to protect herself from the flu: Receive the yearly flu vaccine, practice good hand hygiene washing with soap and also using hand sanitizer when available, eat a nutritious meals, get adequate rest, hydrate appropriately   Please contact the office if your symptoms worsen or you have concerns that you are not improving.   Thank you for choosing [facility] for your healthcare, and for allowing [place] to partner with you on your healthcare journey. I am thankful to be able to provide care to you today.   Wyn Quaker FNP-C

## 2018-08-04 NOTE — Assessment & Plan Note (Signed)
Continue Singulair  Continue flonase  Start nasal saline rinses   Follow-up with Dr. Lamonte Sakai in 2 to 3 months

## 2018-08-04 NOTE — Assessment & Plan Note (Signed)
Talk with coumadin clinic about new antibiotic

## 2018-08-04 NOTE — Patient Instructions (Signed)
Hold coumadin on Friday and sat, take 1/2 tablet on Sunday then continue taking 10mg  daily except 15mg  on Wednesdays and Saturdays. Recheck in  5 days. Coumadin Clinic [phone] call with procedures or new medications.

## 2018-08-04 NOTE — Assessment & Plan Note (Signed)
Doxycycline >>> 1 100 mg tablet every 12 hours for 7 days >>>take with food  >>>wear sunscreen   Talk with coumadin clinic about new antibiotic   Prednisone 10mg  tablet  >>>Take 2 tablets (20 mg total) daily for the next 5 days >>> Take with food in the morning  Continue flonase  Start nasal saline rinses   Follow-up with our office if symptoms are not improving  Follow-up with Dr. Lamonte Sakai in 2 to 3 months

## 2018-08-04 NOTE — Assessment & Plan Note (Signed)
  Continue Symbicort 160 >>> 2 puffs in the morning right when you wake up, rinse out your mouth after use, 12 hours later 2 puffs, rinse after use >>> Take this daily, no matter what >>> This is not a rescue inhaler   Follow-up with our office if symptoms are not improving  Follow-up with Dr. Lamonte Sakai in 2 to 3 months

## 2018-08-04 NOTE — Progress Notes (Signed)
@Patient  ID: Katherine [name], female    DOB: 06-16-1956, 62 y.o.   MRN: [id number]  Chief Complaint  Patient presents with  . Follow-up    cough     Referring provider: [name], MD  HPI:  62 year old female patient followed in our office for obstructive sleep apnea and restrictive lung disease  PMH: Obesity, status post lap band surgery, renal transplant (on chronic immunosuppression with Imuran and prednisone) Smoker/ Smoking History: Former smoker.  Quit 2004.  30-pack-year.  Dr. Lake Bells Maintenance: Symbicort 160 Pt of: Dr. Lamonte Sakai  08/04/2018  - Visit   62 year old female patient presenting today for acute visit.  Patient reports she has had worsening shortness of breath, cough, wheezing for the last 2 to 3 months.  Patient reports that she did not think to follow-up with our office as she has been overwhelmed with other doctors office visits as well as taking care of her husband who is currently being treated for cancer.  Patient also recently had her lap band tightened today.  Patient now would like to have the symptoms resolved as it is a family over his birthday today and Christmas is coming up in 6 days.  Patient reports she "cannot be sick on Christmas as she was already sick on Thanksgiving".  Patient reports adherence to her Symbicort 160.  Patient reporting persistent cough that is typically in the morning with productive yellow mucus.  Patient reports she has been afebrile but she also says that she is physically incapable of having a fever due to her immunosuppression with Imuran and prednisone use.  Patient has a worsened wheeze with exertion and occasionally when lying down.  Patient reports increased fatigue but this is somewhat her baseline.  Patient with multiple comorbid conditions that contribute to this fatigue.  Patient also reporting persistent sinus pain and pressure which is been present for about 2 weeks now.  Patient reports this is not been getting  better.  Patient was seen by primary care last month and completed a chest x-ray which was negative for pneumonia or pleural effusion.  Does show bronchitic changes and bronchitis cannot be completely ruled out.  Patient believes symptoms have worsened since then.  Patient was not treated with antibiotics or prednisone at that time.    Tests:  07/04/2018-chest x-ray-chronic bronchitic changes no alveolar pneumonia or CHF  10/27/2017-echocardiogram- LV ejection fraction 55 to 60%, moderate LVH, grade 2 diastolic dysfunction  09/25/5282-[id number] 2 (69% predicted), ratio 80%, FEV1 72%  FENO:  No results found for: NITRICOXIDE  PFT: PFT Results Latest Ref Rng & Units 07/17/2016  FVC-Pre L 2.51  FVC-Predicted Pre % 84  FVC-Post L 2.53  FVC-Predicted Post % 85  Pre FEV1/FVC % % 80  Post FEV1/FCV % % 86  FEV1-Pre L 2.02  FEV1-Predicted Pre % 87  FEV1-Post L 2.17  DLCO UNC% % 68  DLCO COR %Predicted % 86  TLC L 3.91  TLC % Predicted % 84  RV % Predicted % 75    Imaging: No results found.    Specialty Problems      Pulmonary Problems   Extrinsic asthma    Qualifier: Diagnosis of  By: Ronnald Ramp CNA/MA, Jessica        Sleep apnea    Qualifier: Diagnosis of  By: Ronnald Ramp CNA/MA, Jessica        Chronic allergic rhinitis   Sinusitis, acute maxillary      Allergies  Allergen Reactions  . Tetracycline Hives  .  Hydromorphone Hcl Nausea And Vomiting  . Niacin Other (See Comments)    Mouth blisters  . Niaspan [Niacin Er] Other (See Comments)    Mouth blisters  . Sulfa Antibiotics Nausea Only and Other (See Comments)    "Tears up stomach"  . Sulfonamide Derivatives Other (See Comments)    Reaction: per patient "tears her stomach up"  . Codeine Nausea And Vomiting  . Erythromycin Nausea And Vomiting  . Hydromorphone Hcl Nausea And Vomiting  . Morphine And Related Nausea And Vomiting  . Nalbuphine Nausea And Vomiting  . Sulfasalazine Nausea Only and Other (See  Comments)    per patient "tears her stomach up", "Tears up stomach"  . Tape Rash and Other (See Comments)    No "plastic" tape," please   >>> Reviewed allergies with patient.  Patient reports that she can take doxycycline with no issue.  Immunization History  Administered Date(s) Administered  . Influenza Split 05/11/2012, 05/17/2016, 05/17/2017  . Influenza Whole 06/17/2009, 06/17/2010, 04/18/2011  . Influenza,inj,Quad PF,6+ Mos 08/24/2013, 05/20/2015, 04/21/2018  . Pneumococcal Polysaccharide-23 11/30/2017  . Tdap 11/30/2017  . Zoster Recombinat (Shingrix) 07/04/2018    Past Medical History:  Diagnosis Date  . Anemia   . Anxiety   . Bilateral carotid artery stenosis    Carotid duplex 08/6107: 6-04% LICA, 54-09% RICA, >81% RECA, f/u 1 yr suggested  . CAD (coronary artery disease) of bypass graft 5/01; 3/'02, 8/'03, 10/'04; 1/15   PCI x 5 to SVG-D1   . CAD in native artery 07/1993    3 Vessel Disease (LAD-D1 & RCA) -- CABG  . CAD S/P percutaneous coronary angioplasty    PCI to SVG-D1 insertion/native D1 x 4 = '01 -(S660 BMS 2.5 x 9 - insertion into D1; '02 - distal overlap ACS Pixel 2.5 x 8  BMS; '03 distal/native ISR/Thrombosis - Pixel 2.5 x 13; '04 - ISR-  Taxus 2.5 x 20 (covered all);; 1/15 - mid SVG-D1 (50% distal ISR) - Promus P 2.75 x 20 -- 2.8 mm  . COPD mixed type ([place])    Followed by Dr. Lamonte Sakai "pulmonologist said no COPD"  . Depression   . Depression with anxiety   . Diabetes mellitus type 2 in obese ([place])   . Diarrhea    started after cholecystectomy and mass removed from intestine  . Dyslipidemia, goal LDL below 70    08/2012: TC 137, TG 200, HDL 32!, LDL 45; on statin (followed by Dr.Deterding)  . ESRD (end stage renal disease) ([place]) 1991   s/p Cadaveric Renal Transplant [facility] - Dr. Jimmy Footman)   . Family history of adverse reaction to anesthesia    mom's bp dropped during/after anesthesia  . Fibromyalgia   . GERD (gastroesophageal reflux disease)   .  Glomerulonephritis, chronic, rapidly progressive 28  . 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 07/1993   Rescue PTCA of RCA -- referred for CABG.  . H/O: GI bleed   . Headache    migraines in the past  . History of kidney stones   . Hypertension associated with diabetes ([place])   . MRSA (methicillin resistant staph aureus) culture positive   . Obesity    s/p Lap Band 04/2004- Port Replacement 10/09 & 2/10 for infection; Dr. Excell Seltzer.  . OSA (obstructive sleep apnea)    no longer on CPAP or home O2, states she doesn't need now after lap band  . PAD (peripheral artery disease) ([place]) 08/2013   LEA Dopplers to be read by Dr.  Arida  . PAF (paroxysmal atrial fibrillation) ([place]) 06/2014   Noted on CardioNet Monitor  - rate ~112; no recurrent symptoms. Nonionic regulation because of frequent GI procedures. Patient prefers Plavix  . Pneumonia   . Recurrent boils    Bilateral Groin  . Rheumatoid arthritis ([place])    Per Patient Report; associated with OA  . 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 Servando Snare: LIMA-LAD, SVG-bifurcatingD1, SVG-rPDA  . S/p cadaver renal transplant [place]  . Stroke [facility]) 2012   "right eye stroke- half blind now"  . Unstable angina ([place]) 5/01; 3/'02, 8/'03, 10/'04; 1/15   x 5 occurences since Inf-Post STEMI in 1994    Tobacco History: Social History   Tobacco Use  Smoking Status Former Smoker  . Packs/day: 1.00  . Years: 30.00  . Pack years: 30.00  . Types: Cigarettes  . Last attempt to quit: 08/17/2002  . Years since quitting: 15.9  Smokeless Tobacco Never Used   Counseling given: Yes  Continue to not smoke  Outpatient Encounter Medications as of 08/04/2018  Medication Sig  . acetaminophen (TYLENOL) 500 MG tablet Take 1 tablet (500 mg total) by mouth every 6 (six) hours as needed for moderate pain or headache. (Patient taking differently: Take 1,000 mg by mouth every 6 (six) hours as needed for moderate pain or headache. )  .  albuterol (VENTOLIN HFA) 108 (90 Base) MCG/ACT inhaler INHALE 2 PUFFS INTO THE LUNGS EVERY 6 (SIX) HOURS AS NEEDED FOR WHEEZING OR SHORTNESS OF BREATH. (Patient taking differently: Inhale 2 puffs into the lungs every 6 (six) hours as needed for wheezing or shortness of breath. )  . azaTHIOprine (IMURAN) 50 MG tablet Take 125 mg by mouth every evening.   . budesonide-formoterol (SYMBICORT) 160-4.5 MCG/ACT inhaler Inhale 2 puffs into the lungs 2 (two) times daily.  . calcitRIOL (ROCALTROL) 0.25 MCG capsule Take 0.25 mcg by mouth every 3 (three) days.   . carbamazepine (TEGRETOL) 200 MG tablet TAKE 1 TABLET BY MOUTH TWICE A DAY  . clotrimazole (MYCELEX) 10 MG troche Take 1 tablet (10 mg total) by mouth 5 (five) times daily.  . cyclobenzaprine (FLEXERIL) 10 MG tablet TAKE 1 TABLET BY MOUTH 3 TIMES DAILY AS NEEDED FOR MUSCLE SPASMS (Patient taking differently: Take 10 mg by mouth 2 (two) times daily. )  . diltiazem (CARDIZEM CD) 120 MG 24 hr capsule TAKE 1 CAPSULE (120 MG TOTAL) BY MOUTH DAILY. NEED OV. (Patient taking differently: Take 120 mg by mouth daily. )  . dofetilide (TIKOSYN) 250 MCG capsule Take 1 capsule (250 mcg total) by mouth 2 (two) times daily.  . DULoxetine (CYMBALTA) 30 MG capsule TAKE 2 CAPSULES (60 MG TOTAL) BY MOUTH EVERY MORNING.  . fluticasone (FLONASE) 50 MCG/ACT nasal spray PLACE 2 SPRAYS INTO BOTH NOSTRILS 2 (TWO) TIMES DAILY.  [name] Kitchen glimepiride (AMARYL) 4 MG tablet Take 4 mg by mouth every morning.  . Insulin Glargine (BASAGLAR KWIKPEN) 100 UNIT/ML SOPN Inject 12 Units into the skin daily.  [name] Kitchen LORazepam (ATIVAN) 1 MG tablet Take 1 mg by mouth every morning.   . metFORMIN (GLUCOPHAGE) 500 MG tablet Take 500 mg by mouth 2 (two) times daily.  . metoprolol tartrate (LOPRESSOR) 25 MG tablet Take 1 tablet (25 mg total) by mouth 2 (two) times daily.  . montelukast (SINGULAIR) 10 MG tablet TAKE 1 TABLET BY MOUTH EVERY DAY  . nitroGLYCERIN (NITROSTAT) 0.4 MG SL tablet Place 1 tablet (0.4 mg  total) under the tongue every 5 (  five) minutes as needed for chest pain.  [name] Kitchen NOVOLOG FLEXPEN 100 UNIT/ML FlexPen Inject 4 Units into the skin 2 (two) times daily as needed for high blood sugar.   . oxyCODONE (OXY IR/ROXICODONE) 5 MG immediate release tablet Take 1 tablet (5 mg total) by mouth every 8 (eight) hours as needed for severe pain.  . predniSONE (DELTASONE) 5 MG tablet Take 5 mg by mouth daily with breakfast.  . promethazine (PHENERGAN) 25 MG tablet Take 1 tablet (25 mg total) by mouth every 6 (six) hours as needed for nausea or vomiting (or sleep).  . rosuvastatin (CRESTOR) 20 MG tablet TAKE 1 TABLET BY MOUTH EVERY DAY  . sertraline (ZOLOFT) 100 MG tablet Take 100 mg by mouth every morning.  . warfarin (COUMADIN) 10 MG tablet Take 1 tablet daily or as directed by Coumadin clinic  . clopidogrel (PLAVIX) 75 MG tablet Take 1 tablet (75 mg total) by mouth daily. (Patient not taking: Reported on 08/04/2018)  . doxycycline (VIBRA-TABS) 100 MG tablet Take 1 tablet (100 mg total) by mouth 2 (two) times daily.  . famotidine (PEPCID) 20 MG tablet Take 20 mg by mouth daily.  . ferrous sulfate 325 (65 FE) MG tablet Take 1 tablet (325 mg total) by mouth 3 (three) times daily with meals. (Patient not taking: Reported on 08/04/2018)  . guaiFENesin (MUCINEX) 600 MG 12 hr tablet Take 1 tablet (600 mg total) by mouth 2 (two) times daily. (Patient not taking: Reported on 08/04/2018)  . omeprazole (PRILOSEC) 40 MG capsule Take 40 mg by mouth every morning.   . predniSONE (DELTASONE) 10 MG tablet Take 2 tablets (20mg  total) daily for the next 5 days. Take in the AM with food.  . warfarin (COUMADIN) 2.5 MG tablet TAKE AS DIRECTED BY COUMADIN CLINIC (Patient not taking: Reported on 08/04/2018)   No facility-administered encounter medications on file as of 08/04/2018.      Review of Systems  Review of Systems  Constitutional: Positive for fatigue. Negative for chills, fever (physically impossible for for  patient to have fevers) and unexpected weight change.  HENT: Positive for congestion, postnasal drip, sinus pressure and sinus pain. Negative for ear pain.        +recently removed roach from left ear a few months ago   Respiratory: Positive for cough, chest tightness and wheezing. Negative for shortness of breath.   Cardiovascular: Negative for chest pain and palpitations.  Gastrointestinal: Negative for blood in stool, diarrhea, nausea and vomiting.  Genitourinary: Negative for dysuria, frequency and urgency.  Musculoskeletal: Negative for arthralgias.  Skin: Negative for color change.  Allergic/Immunologic: Positive for immunocompromised state. Negative for environmental allergies and food allergies.  Neurological: Negative for dizziness, light-headedness and headaches.  Psychiatric/Behavioral: Negative for dysphoric mood. The patient is not nervous/anxious.   All other systems reviewed and are negative.    Physical Exam  BP 120/66 (BP Location: Left Wrist, Cuff Size: Normal)   Pulse 82   Temp 97.8 F (36.6 C) (Oral)   Ht 5\' 1"  (1.549 m)   Wt 245 lb (111.1 kg)   SpO2 97%   BMI 46.29 kg/m   Wt Readings from Last 5 Encounters:  08/04/18 245 lb (111.1 kg)  07/04/18 245 lb (111.1 kg)  07/04/18 245 lb (111.1 kg)  05/06/18 234 lb 9.6 oz (106.4 kg)  05/03/18 234 lb 9.6 oz (106.4 kg)    Physical Exam  Constitutional: She is oriented to person, place, and time and well-developed, well-nourished, and in no distress.  No distress.  HENT:  Head: Normocephalic and atraumatic.  Right Ear: Hearing, tympanic membrane, external ear and ear canal normal.  Left Ear: Hearing, tympanic membrane, external ear and ear canal normal.  Nose: Mucosal edema present. Right sinus exhibits maxillary sinus tenderness. Right sinus exhibits no frontal sinus tenderness. Left sinus exhibits maxillary sinus tenderness and frontal sinus tenderness.  Mouth/Throat: Uvula is midline and oropharynx is clear and  moist. No oropharyngeal exudate.  Eyes: Pupils are equal, round, and reactive to light.  Neck: Normal range of motion. Neck supple.  Cardiovascular: Normal rate, regular rhythm and normal heart sounds.  Pulmonary/Chest: Effort normal and breath sounds normal. No accessory muscle usage. No respiratory distress. She has no decreased breath sounds. She has no wheezes. She has no rhonchi. She has no rales.  + Diminished breath sounds throughout exam most likely related to body habitus + Occasional cough during deep breaths on assessment  Musculoskeletal: Normal range of motion.  Lymphadenopathy:    She has no cervical adenopathy.  Neurological: She is alert and oriented to person, place, and time. Gait normal.  Skin: Skin is warm and dry. She is not diaphoretic. No erythema.  Psychiatric: Mood, memory, affect and judgment normal.  Nursing note and vitals reviewed.     Lab Results:  CBC    Component Value Date/Time   WBC 7.6 07/04/2018 1146   RBC 3.86 (L) 07/04/2018 1146   HGB 11.9 (L) 07/04/2018 1146   HCT 35.5 (L) 07/04/2018 1146   PLT 237.0 07/04/2018 1146   MCV 91.8 07/04/2018 1146   MCH 31.4 04/25/2018 0543   MCHC 33.6 07/04/2018 1146   RDW 16.7 (H) 07/04/2018 1146   LYMPHSABS 1.1 07/04/2018 1146   MONOABS 0.7 07/04/2018 1146   EOSABS 0.1 07/04/2018 1146   BASOSABS 0.0 07/04/2018 1146    BMET    Component Value Date/Time   NA 136 05/06/2018 1127   NA 134 (A) 01/04/2018   K 5.3 (H) 05/06/2018 1127   CL 103 05/06/2018 1127   CO2 24 05/06/2018 1127   GLUCOSE 196 (H) 05/06/2018 1127   BUN 13 05/06/2018 1127   BUN 17 01/04/2018   CREATININE 0.98 05/06/2018 1127   CREATININE 0.90 06/26/2014 1108   CALCIUM 9.3 05/06/2018 1127   GFRNONAA >60 05/06/2018 1127   GFRAA >60 05/06/2018 1127    BNP No results found for: BNP  ProBNP    Component Value Date/Time   PROBNP 139.0 (H) 02/07/2016 1224      Assessment & Plan:   62 year old female patient completing acute  visit with our office today.  Patient will be treated as maxillary sinusitis.  Patient with persistent sinus pain on assessment.  Patient reports that Augmentin does not work for her and that she can safely take doxycycline in spite of her tetracycline listed allergy.  Patient reports she is taking doxycycline all the time and that works well for her.  Will use doxycycline as this is patient preference.  Will do short course of prednisone, then patient resume her daily dose of 5 mg.   Explained to patient at length the importance of her following up with our office sooner if she is having these acute symptoms.  Sinusitis, acute maxillary Doxycycline >>> 1 100 mg tablet every 12 hours for 7 days >>>take with food  >>>wear sunscreen   Talk with coumadin clinic about new antibiotic   Prednisone 10mg  tablet  >>>Take 2 tablets (20 mg total) daily for the next 5 days >>>  Take with food in the morning  Continue flonase  Start nasal saline rinses   Follow-up with our office if symptoms are not improving  Follow-up with Dr. Lamonte Sakai in 2 to 3 months  Extrinsic asthma  Continue Symbicort 160 >>> 2 puffs in the morning right when you wake up, rinse out your mouth after use, 12 hours later 2 puffs, rinse after use >>> Take this daily, no matter what >>> This is not a rescue inhaler   Follow-up with our office if symptoms are not improving  Follow-up with Dr. Lamonte Sakai in 2 to 3 months  Chronic allergic rhinitis Continue Singulair  Continue flonase  Start nasal saline rinses   Follow-up with Dr. Lamonte Sakai in 2 to 3 months  Long term (current) use of anticoagulants Talk with coumadin clinic about new antibiotic       Lauraine Rinne, NP 08/04/2018   This appointment was 34 min with over 50% of the time in direct face-to-face patient care, assessment, plan of care, and follow-up.

## 2018-08-05 ENCOUNTER — Other Ambulatory Visit: Payer: Self-pay | Admitting: Cardiology

## 2018-08-22 ENCOUNTER — Ambulatory Visit (INDEPENDENT_AMBULATORY_CARE_PROVIDER_SITE_OTHER): Payer: Medicare Other | Admitting: *Deleted

## 2018-08-22 DIAGNOSIS — Z7901 Long term (current) use of anticoagulants: Secondary | ICD-10-CM

## 2018-08-22 DIAGNOSIS — I480 Paroxysmal atrial fibrillation: Secondary | ICD-10-CM

## 2018-08-22 LAB — POCT INR: INR: 4.2 — AB (ref 2.0–3.0)

## 2018-08-22 NOTE — Patient Instructions (Signed)
Description   Do not take any Coumadin tomorrow and take 5mg  Wednesday then start taking 10mg  daily except 15mg  on  Saturdays. Recheck in 11 days. Coumadin Clinic [phone] call with procedures or new medications.

## 2018-08-24 ENCOUNTER — Telehealth: Payer: Self-pay | Admitting: Emergency Medicine

## 2018-08-24 MED ORDER — FLUTICASONE PROPIONATE 50 MCG/ACT NA SUSP: 2.0000 | Freq: Two times a day (BID) | NASAL | 1 refills | Status: DC

## 2018-08-24 NOTE — Telephone Encounter (Signed)
rx refilled  I have left detailed msg on machine informing her this was done

## 2018-08-25 ENCOUNTER — Other Ambulatory Visit: Payer: Self-pay | Admitting: Internal Medicine

## 2018-08-26 ENCOUNTER — Ambulatory Visit: Payer: Medicare Other | Admitting: Internal Medicine

## 2018-08-26 ENCOUNTER — Ambulatory Visit: Payer: Medicare Other | Admitting: Cardiology

## 2018-08-29 ENCOUNTER — Telehealth: Payer: Self-pay | Admitting: *Deleted

## 2018-08-29 ENCOUNTER — Other Ambulatory Visit (HOSPITAL_COMMUNITY): Payer: Self-pay | Admitting: Physical Medicine & Rehabilitation

## 2018-08-29 DIAGNOSIS — M797 Fibromyalgia: Secondary | ICD-10-CM

## 2018-08-29 DIAGNOSIS — M47816 Spondylosis without myelopathy or radiculopathy, lumbar region: Secondary | ICD-10-CM

## 2018-08-29 MED ORDER — OXYCODONE HCL 5 MG PO TABS: 5.0000 mg | ORAL_TABLET | Freq: Three times a day (TID) | ORAL | 0 refills | Status: DC | PRN

## 2018-08-29 NOTE — Telephone Encounter (Signed)
Patient needs oxycodone refilled.  2 tablets left.  Appointment scheduled January 20th.

## 2018-08-29 NOTE — Telephone Encounter (Signed)
Dr. Naaman Plummer refilled, patient called

## 2018-08-30 ENCOUNTER — Other Ambulatory Visit: Payer: Self-pay | Admitting: Emergency Medicine

## 2018-08-31 ENCOUNTER — Encounter: Payer: Self-pay | Admitting: Internal Medicine

## 2018-09-02 ENCOUNTER — Other Ambulatory Visit: Payer: Self-pay | Admitting: Physical Medicine & Rehabilitation

## 2018-09-02 DIAGNOSIS — M797 Fibromyalgia: Secondary | ICD-10-CM

## 2018-09-02 DIAGNOSIS — M47816 Spondylosis without myelopathy or radiculopathy, lumbar region: Secondary | ICD-10-CM

## 2018-09-05 ENCOUNTER — Other Ambulatory Visit: Payer: Self-pay | Admitting: Emergency Medicine

## 2018-09-05 ENCOUNTER — Encounter: Payer: Medicare Other | Admitting: Physical Medicine & Rehabilitation

## 2018-09-05 ENCOUNTER — Other Ambulatory Visit: Payer: Self-pay | Admitting: Cardiology

## 2018-09-05 NOTE — Telephone Encounter (Signed)
Rx has been sent to the pharmacy electronically. ° °

## 2018-09-08 ENCOUNTER — Encounter: Payer: Self-pay | Admitting: Nurse Practitioner

## 2018-09-08 ENCOUNTER — Ambulatory Visit (INDEPENDENT_AMBULATORY_CARE_PROVIDER_SITE_OTHER): Payer: Medicare Other | Admitting: Pharmacist

## 2018-09-08 ENCOUNTER — Ambulatory Visit (INDEPENDENT_AMBULATORY_CARE_PROVIDER_SITE_OTHER): Payer: Medicare Other | Admitting: Nurse Practitioner

## 2018-09-08 VITALS — BP 140/98 | HR 80 | Temp 98.2°F | Ht 61.0 in | Wt 243.0 lb

## 2018-09-08 DIAGNOSIS — J40 Bronchitis, not specified as acute or chronic: Secondary | ICD-10-CM

## 2018-09-08 DIAGNOSIS — Z7901 Long term (current) use of anticoagulants: Secondary | ICD-10-CM

## 2018-09-08 DIAGNOSIS — I480 Paroxysmal atrial fibrillation: Secondary | ICD-10-CM

## 2018-09-08 LAB — POCT INR: INR: 1.1 — AB (ref 2.0–3.0)

## 2018-09-08 IMAGING — MR MR LUMBAR SPINE W/O CM
4 of 5 series · 26 of 48 positions shown · non-contrast
Comparison: MRI lumbar spine 03/07/2014.

CLINICAL DATA: Low back pain. LEFT leg pain. LEFT knee pain.
Weakness in legs and knees.

EXAM:
MRI LUMBAR SPINE WITHOUT CONTRAST
TECHNIQUE: Multiplanar, multisequence MR imaging of the lumbar spine was
performed. No intravenous contrast was administered.

[Series 3: T2 post-contrast · sagittal · 4.0mm · 0.51mm/px · 6 of 15 slices shown]
[im 1/15]
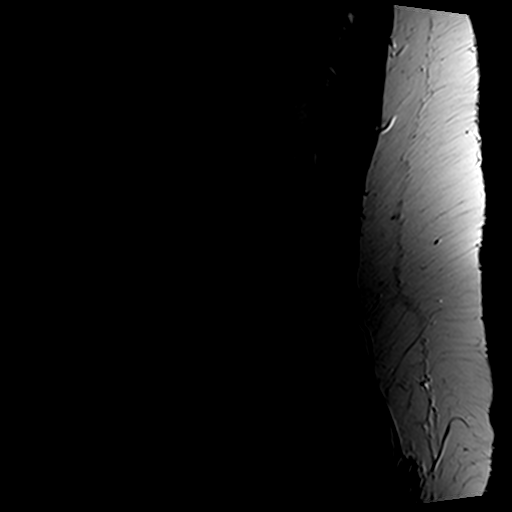
[im 3/15]
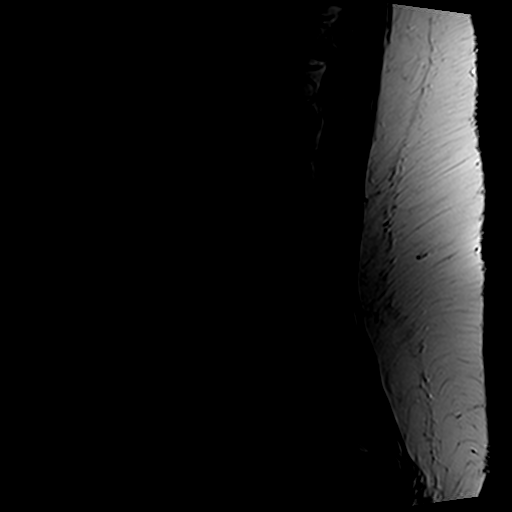
[im 6/15]
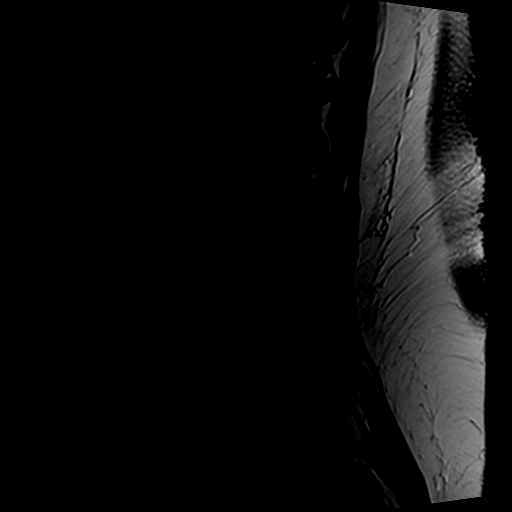
[im 9/15]
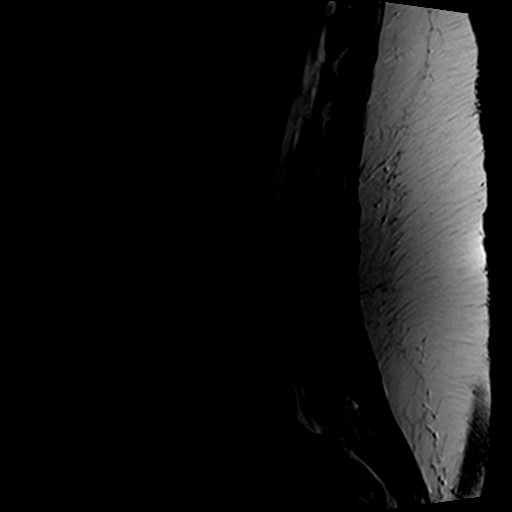
[im 12/15]
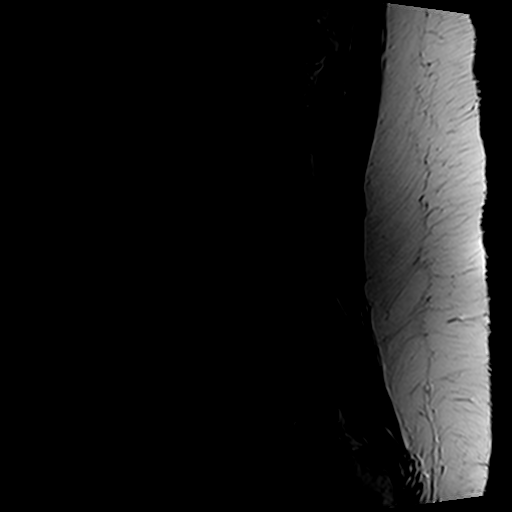
[im 15/15]
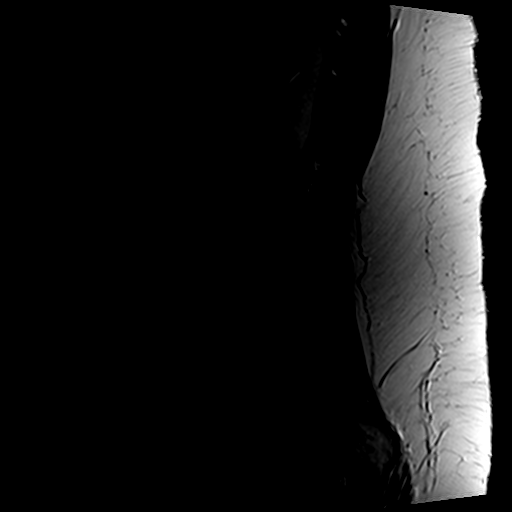

[Series 5: T1 · sagittal · 4.0mm · 0.51mm/px · 5 of 15 slices shown (1 of 2)]
[im 1/15]
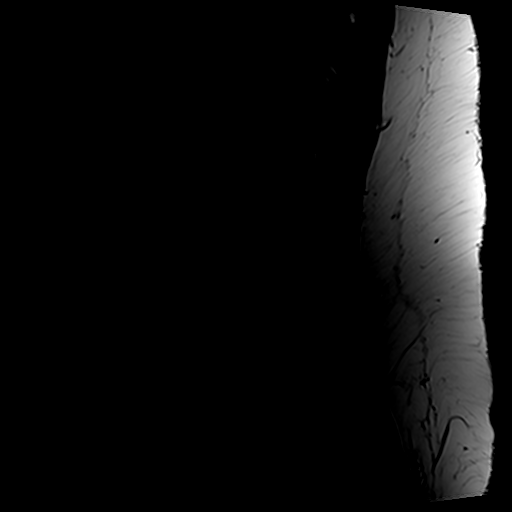
[im 4/15]
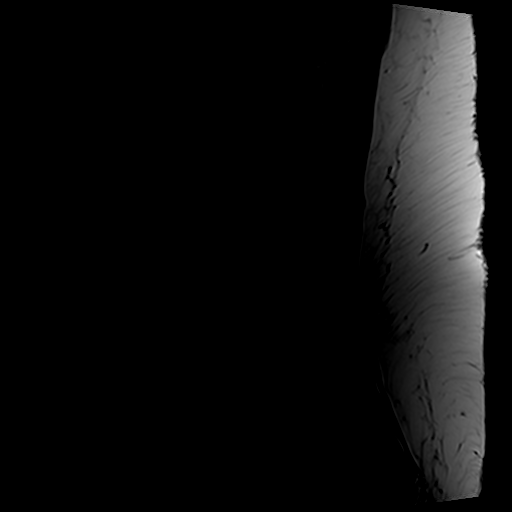
[im 8/15]
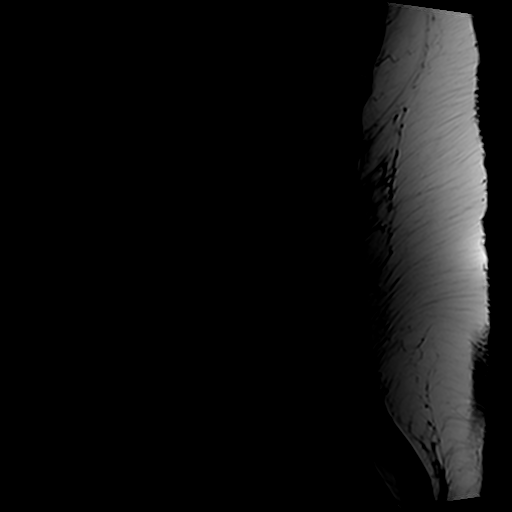
[im 11/15]
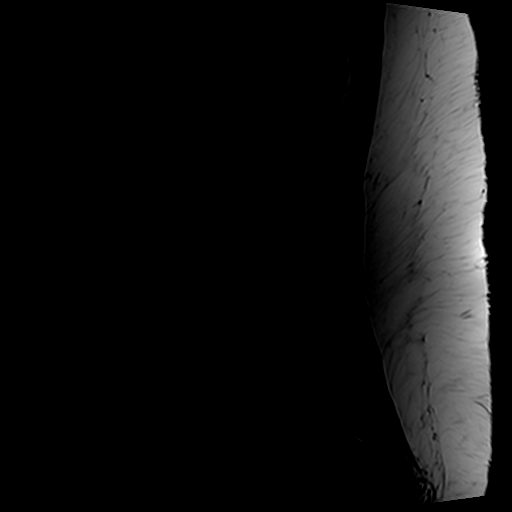
[im 15/15]
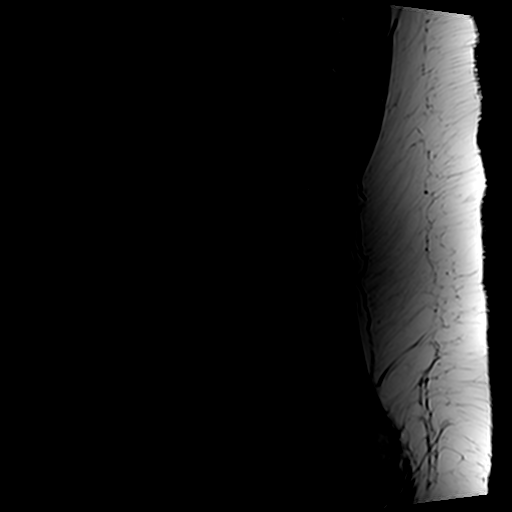

[Series 6: T1 · axial · 4.0mm · 0.35mm/px · z∈[-86,+125]mm · 5 of 46 slices shown (2 of 2)]
[im 4/46]
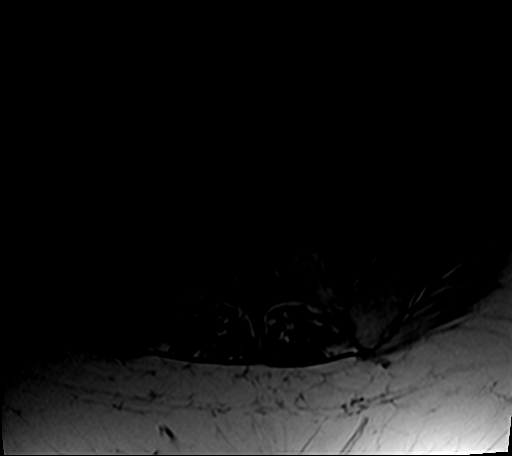
[im 7/46]
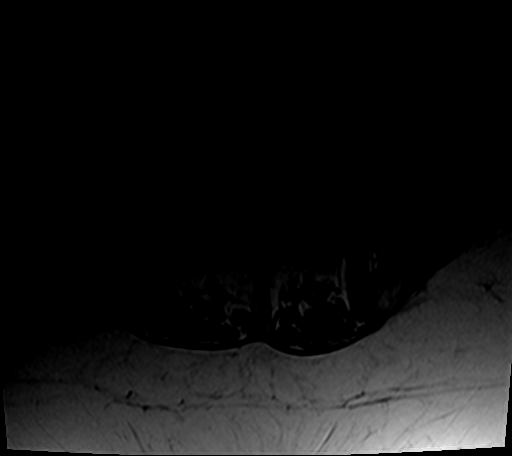
[im 10/46]
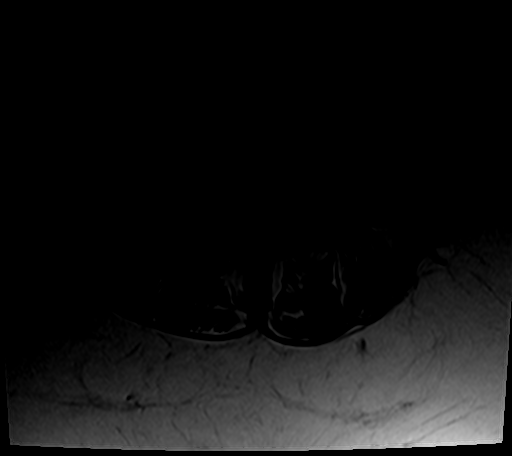
[im 25/46]
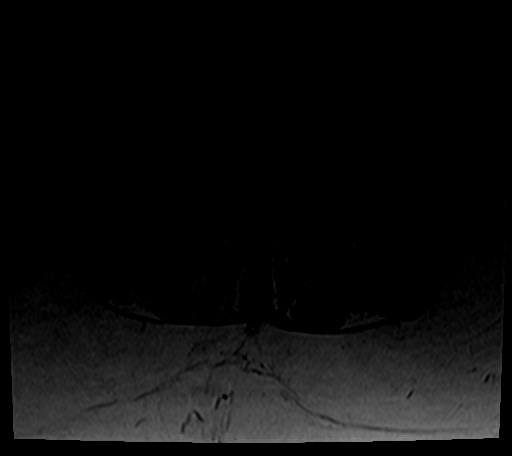
[im 40/46]
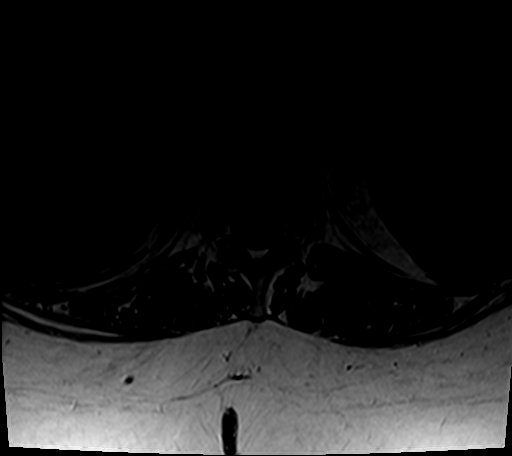

[Series 7: T2 · axial · 4.0mm · 0.70mm/px · z∈[-86,+156]mm · 10 of 46 slices shown]
[im 4/46]
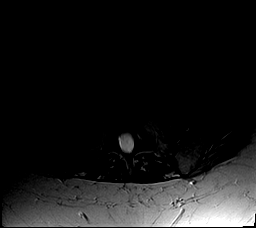
[im 7/46]
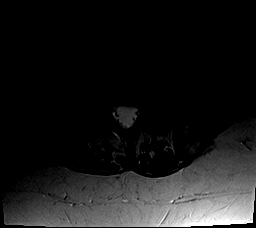
[im 10/46]
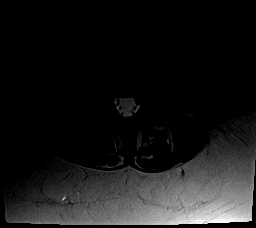
[im 16/46]
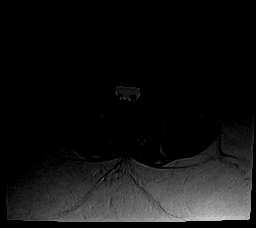
[im 22/46]
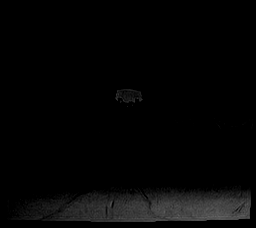
[im 25/46]
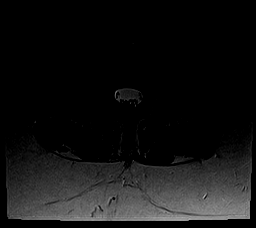
[im 28/46]
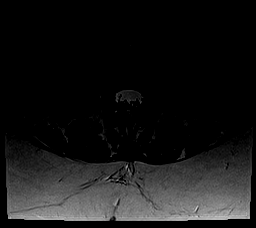
[im 34/46]
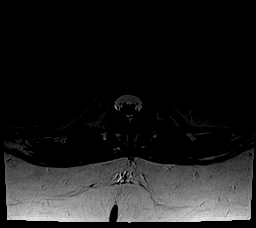
[im 40/46]
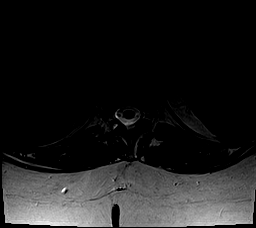
[im 46/46]
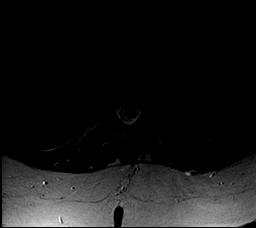

[26 of 48 positions shown; findings below may reference images not displayed]

FINDINGS: Segmentation:  Standard

Alignment:  2 mm anterolisthesis L4-5 is facet mediated.

Vertebrae:  No worrisome osseous lesion.

Conus medullaris and cauda equina: Conus extends to the L2 level.
Conus and cauda equina appear normal.

Paraspinal and other soft tissues: Increased body habitus without
features of epidural lipomatosis.

Disc levels:

T11-T12 sagittal only: Disc space narrowing. No posterior
protrusion.

L1-L2:  Normal.

L2-L3:  Normal.

L3-L4:  Normal.

L4-L5: 2 mm anterolisthesis. Facet arthropathy. Small leftward
foraminal protrusion. See sagittal image 12, and axial image 34. The
LEFT L4 nerve root is contacted, and may be displaced. There does
not appear to be significant foraminal narrowing but the nerve could
be additionally impacted in the extraforaminal compartment.
Correlate clinically for LEFT L4 nerve root irritation.

L5-S1:  Facet arthropathy.  Annular bulge.  No impingement.

Compared with 03/07/2014, foraminal protrusion was present at that
time, and could be slightly worse. Facet arthropathy was also
present at that time, and the subluxation noted today was present
previously.
IMPRESSION: Foraminal/extraforaminal protrusion at L4-5 on the LEFT may be
slightly larger than on the previous study from February 2014; correlate
clinically for symptomatic LEFT L4 nerve root irritation.

2 mm facet mediated anterolisthesis L4-5, without stenosis.
Posterior element hypertrophy is similar to priors.

## 2018-09-08 IMAGING — MR MR KNEE*L* W/O CM
4 of 6 series · 20 of 40 positions shown · non-contrast
Comparison: Radiographs 04/01/2016

CLINICAL DATA: Two year history of left knee pain. No previous
surgery.

EXAM:
MRI OF THE LEFT KNEE WITHOUT CONTRAST
TECHNIQUE: Multiplanar, multisequence MR imaging of the knee was performed. No
intravenous contrast was administered.

[Series 3: pd_tse_fs_tra · axial · 4.0mm · 0.42mm/px · z∈[-28,+60]mm · 3 of 30 slices shown]
[im 5/30]
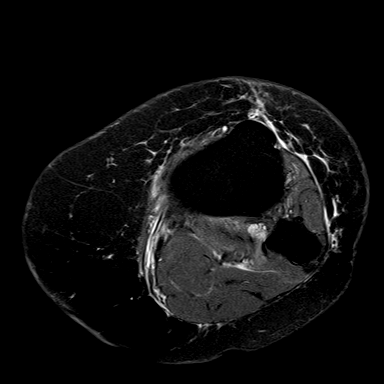
[im 15/30]
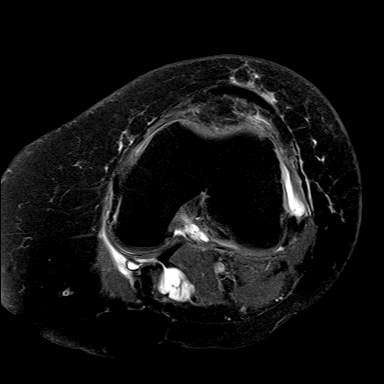
[im 25/30]
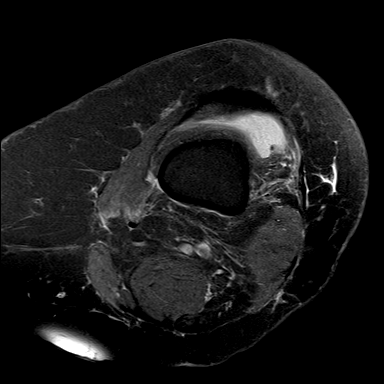

[Series 5: T2 fat-sat · coronal · 3.2mm · 0.62mm/px · 7 of 28 slices shown]
[im 1/28]
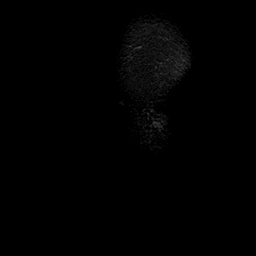
[im 5/28]
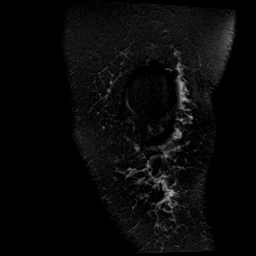
[im 10/28]
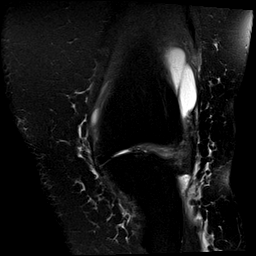
[im 14/28]
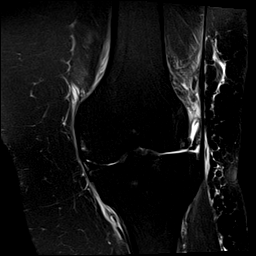
[im 19/28]
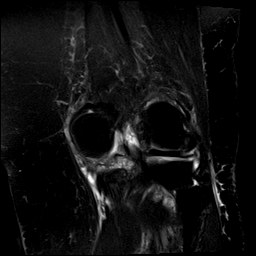
[im 23/28]
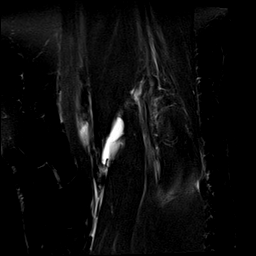
[im 28/28]
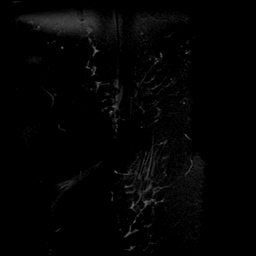

[Series 7: T1 · coronal · 3.2mm · 0.25mm/px · 3 of 28 slices shown]
[im 5/28]
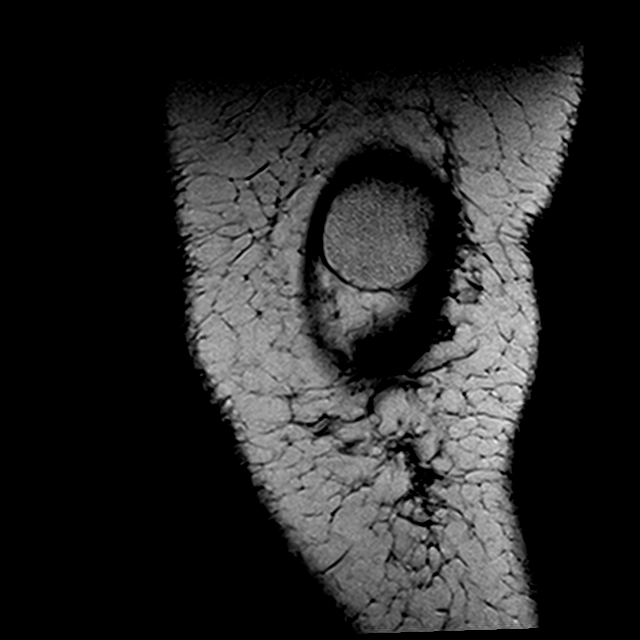
[im 14/28]
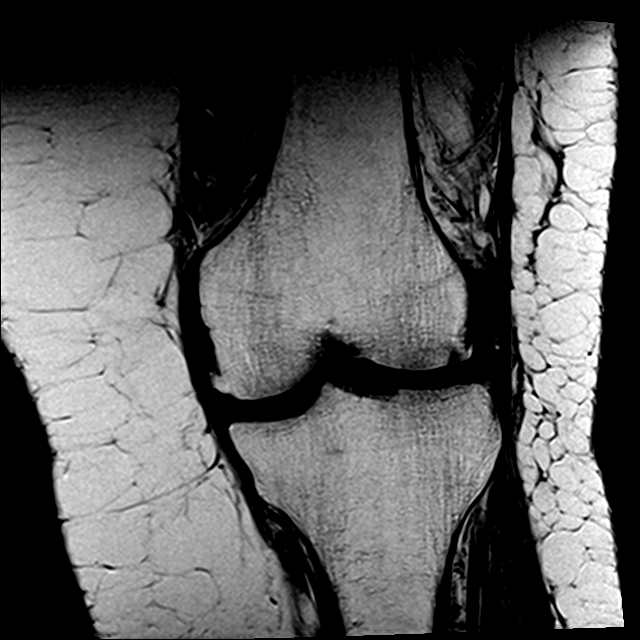
[im 23/28]
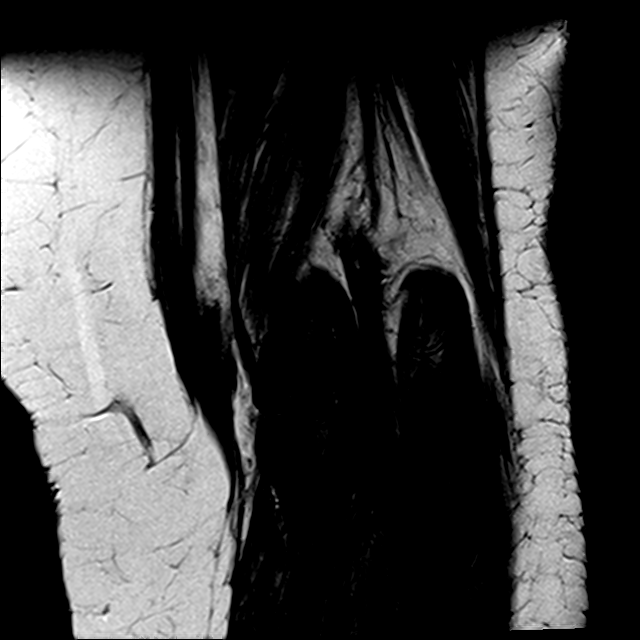

[Series 8: PD fat-sat · sagittal · 3.5mm · 0.25mm/px · 7 of 27 slices shown]
[im 1/27]
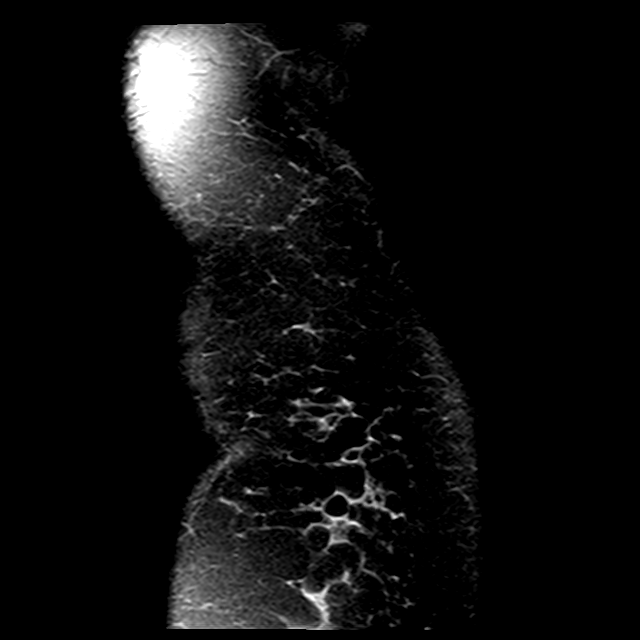
[im 5/27]
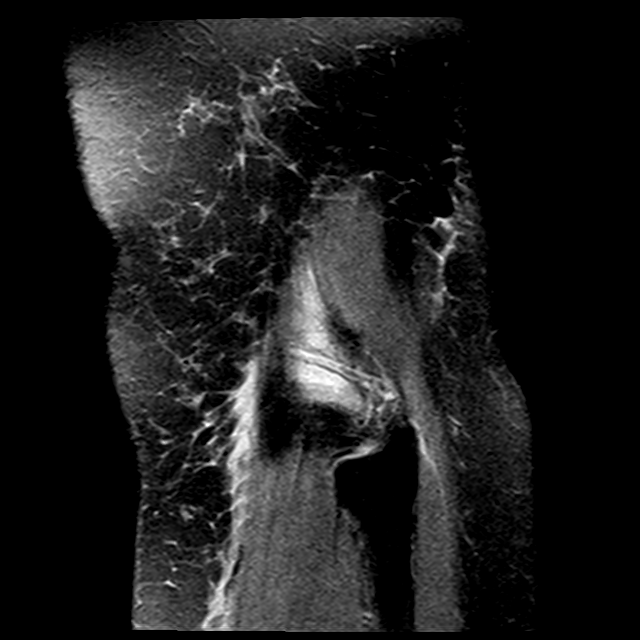
[im 9/27]
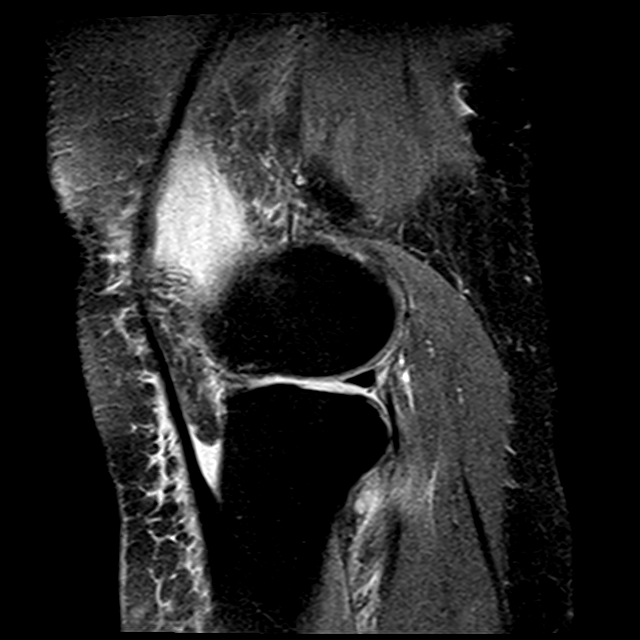
[im 14/27]
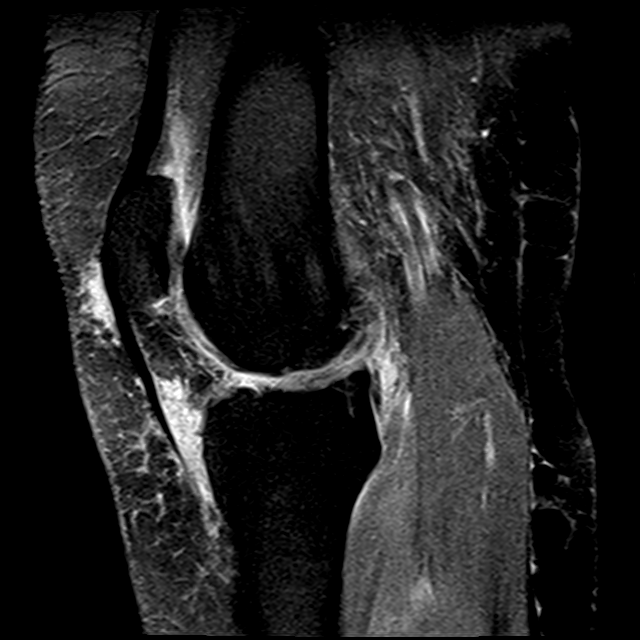
[im 18/27]
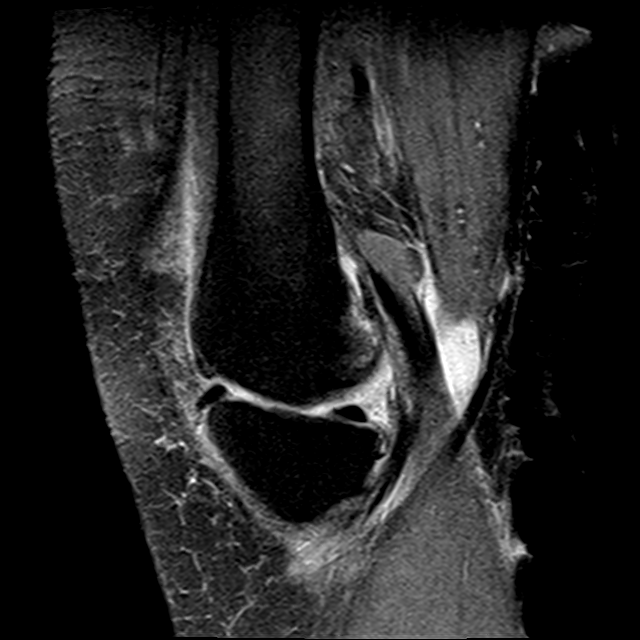
[im 22/27]
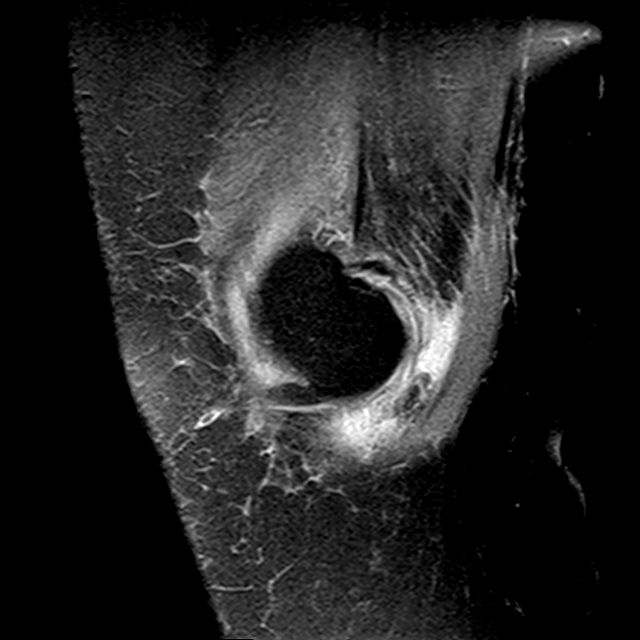
[im 27/27]
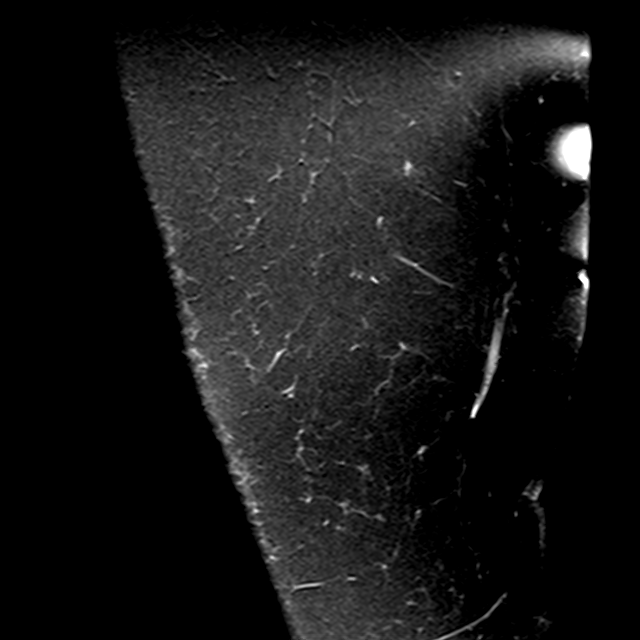

[20 of 40 positions shown; findings below may reference images not displayed]

FINDINGS: MENISCI

Medial meniscus:  Intact.

Lateral meniscus: Degenerated and torn anterior horn and mid body
junction region with a macerated appearance.

LIGAMENTS

Cruciates:  Intact.  Advanced mucoid degeneration of the ACL.

Collaterals:  Intact

CARTILAGE

Patellofemoral:  Moderate degenerative chondrosis.

Medial: Moderate to advanced degenerative chondrosis with moderate
cartilage thinning, early joint space narrowing and spurring.

Lateral: Advanced degenerative chondrosis with areas of full or near
full-thickness cartilage loss, joint space narrowing and spurring.

Joint: Moderate-sized joint effusion and mild synovitis. Multi
septated cystic structure in the lower aspect of Hoffa's fat is most
likely a ganglion cyst. Similar findings posteriorly near the PCL
attachment site on the tibia.

Popliteal Fossa:  Small leaking Baker's cyst.

Extensor Mechanism: The patella retinacular structures are intact
and the quadriceps and patellar tendons are intact.

Bones: No acute bony findings. Tricompartmental degenerative
changes.

Other: Normal knee musculature.
IMPRESSION: 1. Severely degenerated and torn lateral meniscus.
2. Advanced mucoid degeneration of the ACL.
3. Tricompartmental degenerative changes most significant in the
lateral compartment.
4. Moderate-sized joint effusion and mild synovitis. Small leaking
Baker's cyst.

## 2018-09-08 MED ORDER — DOXYCYCLINE HYCLATE 100 MG PO TABS: 100.0000 mg | ORAL_TABLET | Freq: Two times a day (BID) | ORAL | 0 refills | Status: DC

## 2018-09-08 NOTE — Patient Instructions (Signed)
Take doxycycline as prescribed  Tell coumadin clinic today about the doxycyline.  Please follow up for fevers over 101, if your symptoms get worse, or if your symptoms dont improve with the antibiotic.   Acute Bronchitis, Adult Acute bronchitis is when air tubes (bronchi) in the lungs suddenly get swollen. The condition can make it hard to breathe. It can also cause these symptoms:  A cough.  Coughing up clear, yellow, or green mucus.  Wheezing.  Chest congestion.  Shortness of breath.  A fever.  Body aches.  Chills.  A sore throat. Follow these instructions at home:  Medicines  Take over-the-counter and prescription medicines only as told by your doctor.  If you were prescribed an antibiotic medicine, take it as told by your doctor. Do not stop taking the antibiotic even if you start to feel better. General instructions  Rest.  Drink enough fluids to keep your pee (urine) pale yellow.  Avoid smoking and secondhand smoke. If you smoke and you need help quitting, ask your doctor. Quitting will help your lungs heal faster.  Use an inhaler, cool mist vaporizer, or humidifier as told by your doctor.  Keep all follow-up visits as told by your doctor. This is important. How is this prevented? To lower your risk of getting this condition again:  Wash your hands often with soap and water. If you cannot use soap and water, use hand sanitizer.  Avoid contact with people who have cold symptoms.  Try not to touch your hands to your mouth, nose, or eyes.  Make sure to get the flu shot every year. Contact a doctor if:  Your symptoms do not get better in 2 weeks. Get help right away if:  You cough up blood.  You have chest pain.  You have very bad shortness of breath.  You become dehydrated.  You faint (pass out) or keep feeling like you are going to pass out.  You keep throwing up (vomiting).  You have a very bad headache.  Your fever or chills gets  worse. This information is not intended to replace advice given to you by your health care provider. Make sure you discuss any questions you have with your health care provider. Document Released: 01/20/2008 Document Revised: 03/17/2017 Document Reviewed: 01/22/2016 Elsevier Interactive Patient Education  2019 [employer].

## 2018-09-08 NOTE — Progress Notes (Signed)
Katherine [name] is a 63 y.o. female with the following history as recorded in EpicCare:  Patient Active Problem List   Diagnosis Date Noted  . Sinusitis, acute maxillary 08/04/2018  . Herpes zoster without complication 02/54/2706  . Long term (current) use of anticoagulants 04/28/2018  . 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 S/P kidney transplant 01/28/2018  . Primary osteoarthritis of right knee 08/18/2017  . Aortic valve sclerosis 01/28/2017  . 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 Deficiency anemia 10/14/2016  . Spondylosis of lumbar region without myelopathy or radiculopathy 03/30/2016  . Type 2 diabetes mellitus with peripheral neuropathy ([place]) 03/30/2016  . Paroxysmal atrial fibrillation ([place]) 06/28/2014  . Hypertension associated with diabetes ([place])   . PAD (peripheral artery disease) ([place]) 08/17/2013  . Stenosis of right carotid artery without infarction 10/08/2012  . Dyslipidemia, goal LDL below 70 10/08/2012    Class: Diagnosis of  . Mitral annular calcification 10/08/2012  . Renal transplant disorder 06/12/2012  . Chronic allergic rhinitis 04/29/2011  . Sleep apnea 09/09/2007  . Extrinsic asthma 09/09/2007  . GERD 09/09/2007  . Morbid obesity - s/p Lap Band 9/'05 05/07/2004    Class: Diagnosis of  . S/P CABG (coronary artery bypass graft) x 3 09/07/1993    Class: History of    Current Outpatient Medications  Medication Sig Dispense Refill  . acetaminophen (TYLENOL) 500 MG tablet Take 1 tablet (500 mg total) by mouth every 6 (six) hours as needed for moderate pain or headache. (Patient taking differently: Take 1,000 mg by mouth every 6 (six) hours as needed for moderate pain or headache. ) 30 tablet 0  . albuterol (VENTOLIN HFA) 108 (90 Base) MCG/ACT inhaler Inhale 2 puffs into the lungs every 6 (six) hours as needed for wheezing or shortness of breath. 1 Inhaler 2  . azaTHIOprine (IMURAN) 50 MG tablet Take 125 mg by mouth every evening.     .  budesonide-formoterol (SYMBICORT) 160-4.5 MCG/ACT inhaler Inhale 2 puffs into the lungs 2 (two) times daily. 1 Inhaler 0  . calcitRIOL (ROCALTROL) 0.25 MCG capsule Take 0.25 mcg by mouth every 3 (three) days.     . carbamazepine (TEGRETOL) 200 MG tablet TAKE 1 TABLET BY MOUTH TWICE A DAY 180 tablet 0  . clopidogrel (PLAVIX) 75 MG tablet TAKE 1 TABLET BY MOUTH EVERY DAY 30 tablet 0  . clotrimazole (MYCELEX) 10 MG troche Take 1 tablet (10 mg total) by mouth 5 (five) times daily. 70 tablet 0  . cyclobenzaprine (FLEXERIL) 10 MG tablet TAKE 1 TABLET BY MOUTH 3 TIMES DAILY AS NEEDED FOR MUSCLE SPASMS (Patient taking differently: Take 10 mg by mouth 2 (two) times daily. ) 90 tablet 2  . diltiazem (CARDIZEM CD) 120 MG 24 hr capsule TAKE 1 CAPSULE (120 MG TOTAL) BY MOUTH DAILY. NEED OV. (Patient taking differently: Take 120 mg by mouth daily. ) 30 capsule 0  . dofetilide (TIKOSYN) 250 MCG capsule Take 1 capsule (250 mcg total) by mouth 2 (two) times daily. 60 capsule 6  . DULoxetine (CYMBALTA) 30 MG capsule TAKE 2 CAPSULES (60 MG TOTAL) BY MOUTH EVERY MORNING. 180 capsule 2  . famotidine (PEPCID) 20 MG tablet Take 20 mg by mouth daily.    . ferrous sulfate 325 (65 FE) MG tablet Take 1 tablet (325 mg total) by mouth 3 (three) times daily with meals. 90 tablet 0  . fluticasone (FLONASE) 50 MCG/ACT nasal spray Place 2 sprays into both nostrils 2 (two) times daily. [place]  g 1  . glimepiride (AMARYL) 4 MG tablet Take 4 mg by mouth every morning.  6  . guaiFENesin (MUCINEX) 600 MG 12 hr tablet Take 1 tablet (600 mg total) by mouth 2 (two) times daily. 30 tablet 0  . Insulin Glargine (BASAGLAR KWIKPEN) 100 UNIT/ML SOPN Inject 12 Units into the skin daily.    [name] Kitchen LORazepam (ATIVAN) 1 MG tablet Take 1 mg by mouth every morning.     . metFORMIN (GLUCOPHAGE) 500 MG tablet Take 500 mg by mouth 2 (two) times daily.  6  . metoprolol tartrate (LOPRESSOR) 25 MG tablet TAKE 1 TABLET BY MOUTH TWICE A DAY 180 tablet 1  .  montelukast (SINGULAIR) 10 MG tablet TAKE 1 TABLET BY MOUTH EVERY DAY 30 tablet 2  . nitroGLYCERIN (NITROSTAT) 0.4 MG SL tablet Place 1 tablet (0.4 mg total) under the tongue every 5 (five) minutes as needed for chest pain. 25 tablet 6  . NOVOLOG FLEXPEN 100 UNIT/ML FlexPen Inject 4 Units into the skin 2 (two) times daily as needed for high blood sugar.   6  . omeprazole (PRILOSEC) 40 MG capsule Take 40 mg by mouth every morning.     [name] Kitchen oxyCODONE (OXY IR/ROXICODONE) 5 MG immediate release tablet Take 1 tablet (5 mg total) by mouth every 8 (eight) hours as needed for severe pain. 60 tablet 0  . predniSONE (DELTASONE) 10 MG tablet Take 2 tablets (20mg  total) daily for the next 5 days. Take in the AM with food. 10 tablet 0  . predniSONE (DELTASONE) 5 MG tablet Take 5 mg by mouth daily with breakfast.    . promethazine (PHENERGAN) 25 MG tablet Take 1 tablet (25 mg total) by mouth every 6 (six) hours as needed for nausea or vomiting (or sleep). 15 tablet 0  . rosuvastatin (CRESTOR) 20 MG tablet TAKE 1 TABLET BY MOUTH EVERY DAY 90 tablet 1  . sertraline (ZOLOFT) 100 MG tablet Take 100 mg by mouth every morning.    . warfarin (COUMADIN) 10 MG tablet TAKE 1 TABLET DAILY OR AS DIRECTED BY COUMADIN CLINIC 120 tablet 1  . warfarin (COUMADIN) 2.5 MG tablet TAKE AS DIRECTED BY COUMADIN CLINIC 120 tablet 1  . doxycycline (VIBRA-TABS) 100 MG tablet Take 1 tablet (100 mg total) by mouth 2 (two) times daily. 20 tablet 0   No current facility-administered medications for this visit.     Allergies: Tetracycline; Hydromorphone hcl; Niacin; Niaspan [niacin er]; Sulfa antibiotics; Sulfonamide derivatives; Codeine; Erythromycin; Hydromorphone hcl; Morphine and related; Nalbuphine; Sulfasalazine; and Tape  Past Medical History:  Diagnosis Date  . Anemia   . Anxiety   . Bilateral carotid artery stenosis    Carotid duplex 10/2353: 7-32% LICA, 20-25% RICA, >42% RECA, f/u 1 yr suggested  . CAD (coronary artery disease) of  bypass graft 5/01; 3/'02, 8/'03, 10/'04; 1/15   PCI x 5 to SVG-D1   . CAD in native artery 07/1993    3 Vessel Disease (LAD-D1 & RCA) -- CABG  . CAD S/P percutaneous coronary angioplasty    PCI to SVG-D1 insertion/native D1 x 4 = '01 -(S660 BMS 2.5 x 9 - insertion into D1; '02 - distal overlap ACS Pixel 2.5 x 8  BMS; '03 distal/native ISR/Thrombosis - Pixel 2.5 x 13; '04 - ISR-  Taxus 2.5 x 20 (covered all);; 1/15 - mid SVG-D1 (50% distal ISR) - Promus P 2.75 x 20 -- 2.8 mm  . COPD mixed type ([place])    Followed by Dr. Lamonte Sakai "pulmonologist  said no COPD"  . Depression   . Depression with anxiety   . Diabetes mellitus type 2 in obese ([place])   . Diarrhea    started after cholecystectomy and mass removed from intestine  . Dyslipidemia, goal LDL below 70    08/2012: TC 137, TG 200, HDL 32!, LDL 45; on statin (followed by Dr.Deterding)  . ESRD (end stage renal disease) ([place]) 1991   s/p Cadaveric Renal Transplant [facility] - Dr. Jimmy Footman)   . Family history of adverse reaction to anesthesia    mom's bp dropped during/after anesthesia  . Fibromyalgia   . GERD (gastroesophageal reflux disease)   . Glomerulonephritis, chronic, rapidly progressive 28  . 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 07/1993   Rescue PTCA of RCA -- referred for CABG.  . H/O: GI bleed   . Headache    migraines in the past  . History of kidney stones   . Hypertension associated with diabetes ([place])   . MRSA (methicillin resistant staph aureus) culture positive   . Obesity    s/p Lap Band 04/2004- Port Replacement 10/09 & 2/10 for infection; Dr. Excell Seltzer.  . OSA (obstructive sleep apnea)    no longer on CPAP or home O2, states she doesn't need now after lap band  . PAD (peripheral artery disease) ([place]) 08/2013   LEA Dopplers to be read by Dr. Fletcher Anon  . PAF (paroxysmal atrial fibrillation) ([place]) 06/2014   Noted on CardioNet Monitor  - rate ~112; no recurrent symptoms. Nonionic regulation because  of frequent GI procedures. Patient prefers Plavix  . Pneumonia   . Recurrent boils    Bilateral Groin  . Rheumatoid arthritis ([place])    Per Patient Report; associated with OA  . 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 Servando Snare: LIMA-LAD, SVG-bifurcatingD1, SVG-rPDA  . S/p cadaver renal transplant [place]  . Stroke [facility]) 2012   "right eye stroke- half blind now"  . Unstable angina ([place]) 5/01; 3/'02, 8/'03, 10/'04; 1/15   x 5 occurences since Inf-Post STEMI in 1994    Past Surgical History:  Procedure Laterality Date  . ABDOMINAL AORTOGRAM N/A 04/21/2018   Procedure: ABDOMINAL AORTOGRAM;  Surgeon: [name], MD;  Location: [facility];  Service: Cardiovascular;  Laterality: N/A;  . CARDIAC CATHETERIZATION  5/'01, 3/'02, 8/'03, 10/'04; 1/'15   08/22/2013: LAD & RCA 100%; LIMA-LAD & SVG-rPDA patent; Cx-- OM1 60%, OM2 ostial ~50%; SVG-D1 - 80% mid, 50% distal ISR --PCI  . CATHETER REMOVAL    . CHOLECYSTECTOMY N/A 10/29/2014   Procedure: LAPAROSCOPIC CHOLECYSTECTOMY WITH INTRAOPERATIVE CHOLANGIOGRAM;  Surgeon: Excell Seltzer, MD;  Location: [facility];  Service: General;  Laterality: N/A;  . CORONARY ANGIOPLASTY  1994   x5  . CORONARY ARTERY BYPASS GRAFT  1995   LIMA-LAD, SVG-RPDA, SVG-D1  . ESOPHAGOGASTRODUODENOSCOPY N/A 10/15/2016   Procedure: ESOPHAGOGASTRODUODENOSCOPY (EGD);  Surgeon: Wilford Corner, MD;  Location: [facility] ENDOSCOPY;  Service: Endoscopy;  Laterality: N/A;  . I&D EXTREMITY Right 01/29/2018   Procedure: IRRIGATION AND DEBRIDEMENT THUMB;  Surgeon: Dayna Barker, MD;  Location: [facility];  Service: Plastics;  Laterality: Right;  . INCISE AND DRAIN ABCESS    . KIDNEY TRANSPLANT  1991  . KNEE ARTHROSCOPY WITH LATERAL MENISECTOMY Left 12/03/2017   Procedure: LEFT KNEE ARTHROSCOPY WITH LATERAL MENISECTOMY;  Surgeon: Earlie Server, MD;  Location: [facility];  Service: Orthopedics;  Laterality: Left;  . LAPAROSCOPIC GASTRIC BANDING  04/2004; 10/'09, 2/'10   Port Replacement x  2  . LEFT  HEART CATH AND CORS/GRAFTS ANGIOGRAPHY N/A 04/21/2018   Procedure: LEFT HEART CATH AND CORS/GRAFTS ANGIOGRAPHY;  Surgeon: [name], MD;  Location: [facility];  Service: Cardiovascular;  Laterality: N/A;  . LEFT HEART CATHETERIZATION WITH CORONARY/GRAFT ANGIOGRAM N/A 08/23/2013   Procedure: LEFT HEART CATHETERIZATION WITH Beatrix Fetters;  Surgeon: Wellington Hampshire, MD;  Location: [facility] CATH LAB;  Service: Cardiovascular;  Laterality: N/A;  . Lower Extremity Arterial Dopplers  08/2013   ABI: R 0.96, L 1.04  . MULTIPLE TOOTH EXTRACTIONS  age 9  . NM MYOVIEW LTD  03/2016   EF 62%. LOW RISK. C/W prior MI - no Ischemia. Apical hypokinesis.  [name] Kitchen PERCUTANEOUS CORONARY STENT INTERVENTION (PCI-S)  5/'01, 3/'02, 8/'03, 10/'04;   '01 - S660 BMS 2.5 x 9 - dSVG-D1 into D1; '02- post-stent stenosis - 2.5 x 8 Pixel BMS; '8\03: ISR/Thrombosis into native D1 - AngioJet, 2.5 x 13 Pixel; '04 - ISR 95% - covered stented area with Taxus DES 2.5 mm x 20 (2.88)  . PERCUTANEOUS CORONARY STENT INTERVENTION (PCI-S)  08/23/2013   Procedure: PERCUTANEOUS CORONARY STENT INTERVENTION (PCI-S);  Surgeon: Wellington Hampshire, MD;  Location: [facility] CATH LAB;  Service: Cardiovascular;;mid SVG-D1 80%; distal stent ~50% ISR; Promus [place] DES 2.75 mm xc 20 mm (2.8 mm)  . PORT-A-CATH REMOVAL     kidney  . TRANSTHORACIC ECHOCARDIOGRAM  02/2016   EF 55-60%. Septal dyssynergy from CABG GR 2 DD. Aortic valve is trileaflet the functional bicuspid with sclerosis but not yet notable for stenosis stenosis.; no Significant change  . TUBAL LIGATION    . wrist fistula repair Left    dialysis for one year    Family History  Problem Relation Age of Onset  . Cancer Mother        liver  . Heart disease Father   . Cancer Father        colon  . Arrhythmia Brother        Atrial Fibrillation  . Arrhythmia Paternal Aunt        Atrial Fibrillation    Social History   Tobacco Use  . Smoking status: Former Smoker    Packs/day:  1.00    Years: 30.00    Pack years: 30.00    Types: Cigarettes    Last attempt to quit: 08/17/2002    Years since quitting: 16.0  . Smokeless tobacco: Never Used  Substance Use Topics  . Alcohol use: No     Subjective:  Ms Chatterjee is here today for c/o cough/cold symptoms, which first began about 1 week ago, after she was taking a shower and hot water ran out, finished cold shower and since then shes had chills, body aches, sinus pain and pressure, ear pain and pressure, sneezing, nasal congestion, wheezing. She was treated by her pulmonologist with doxycyline course for similar symptoms in December, states symptoms fully resolved after doxycyline course and she Is requesting to repeat this antibiotic, did well on the doxycycline and didn't notice any adverse effects Denies syncope, confusion, chest pain, abdominal pain, nausea, vomiting. Reports she 'always' has diarrhea since gallbladder surgery years ago, this is no worse recently. Tried at home: nyquil, taking symbicort daily as prescribed Smoker: former Feels like her symptoms are getting worse  ROS - see HPI  Objective:  Vitals:   09/08/18 1411  BP: (!) 140/98  Pulse: 80  Temp: 98.2 F (36.8 C)  TempSrc: Oral  SpO2: 95%  Weight: 243 lb (  110.2 kg)  Height: 5\' 1"  (1.549 m)    General: Well developed, well nourished, in no acute distress  Skin : Warm and dry.  Head: Normocephalic and atraumatic  Eyes: Sclera and conjunctiva clear; pupils round and reactive to light; extraocular movements intact  Ears: External normal; canals clear; tympanic membranes normal  Oropharynx: Pink, supple. No suspicious lesions  Neck: Supple without thyromegaly, adenopathy  Lungs: Respirations unlabored; clear to auscultation bilaterally without wheeze, rales, rhonchi  CVS exam: normal rate and regular rhythm.  Extremities: No edema, cyanosis, clubbing  Vessels: Symmetric bilaterally  Neurologic: Alert and oriented; speech intact; face  symmetrical; moves all extremities well; CNII-XII intact without focal deficit  Psychiatric: Normal mood and affect.   Assessment:  1. Bronchitis   2. Long term (current) use of anticoagulants     Plan:   Several antibiotic allergies. Due to duration, worsening symptoms will send doxycyline course-medication dosing, side effects discussed She will notify Coumadin clinic of antibiotic course, she says she is going to coumadin clinic today for routine appointment Home management, red flags and return precautions including when to seek immediate care discussed and printed on AVS-she will f/u for new, worsening symptoms or if symptoms persist after antibtioic    No follow-ups on file.  No orders of the defined types were placed in this encounter.   Requested Prescriptions   Signed Prescriptions Disp Refills  . doxycycline (VIBRA-TABS) 100 MG tablet 20 tablet 0    Sig: Take 1 tablet (100 mg total) by mouth 2 (two) times daily.

## 2018-09-08 NOTE — Patient Instructions (Signed)
Description   Start taking 10mg  daily. Recheck in 1 week. Starting doxycycline on 1/23 for 10 days. Coumadin Clinic [phone] call with procedures or new medications.

## 2018-09-12 ENCOUNTER — Encounter: Payer: Self-pay | Admitting: Physical Medicine & Rehabilitation

## 2018-09-12 ENCOUNTER — Encounter: Payer: Medicare Other | Attending: Physical Medicine & Rehabilitation | Admitting: Physical Medicine & Rehabilitation

## 2018-09-12 VITALS — BP 115/68 | HR 64 | Ht 61.0 in | Wt 245.0 lb

## 2018-09-12 DIAGNOSIS — E669 Obesity, unspecified: Secondary | ICD-10-CM | POA: Insufficient documentation

## 2018-09-12 DIAGNOSIS — F418 Other specified anxiety disorders: Secondary | ICD-10-CM | POA: Diagnosis not present

## 2018-09-12 DIAGNOSIS — E1122 Type 2 diabetes mellitus with diabetic chronic kidney disease: Secondary | ICD-10-CM | POA: Diagnosis not present

## 2018-09-12 DIAGNOSIS — G894 Chronic pain syndrome: Secondary | ICD-10-CM | POA: Diagnosis not present

## 2018-09-12 DIAGNOSIS — Z6841 Body Mass Index (BMI) 40.0 and over, adult: Secondary | ICD-10-CM | POA: Insufficient documentation

## 2018-09-12 DIAGNOSIS — Z940 Kidney transplant status: Secondary | ICD-10-CM | POA: Insufficient documentation

## 2018-09-12 DIAGNOSIS — I739 Peripheral vascular disease, unspecified: Secondary | ICD-10-CM | POA: Diagnosis not present

## 2018-09-12 DIAGNOSIS — E118 Type 2 diabetes mellitus with unspecified complications: Secondary | ICD-10-CM | POA: Diagnosis not present

## 2018-09-12 DIAGNOSIS — M79605 Pain in left leg: Secondary | ICD-10-CM | POA: Diagnosis not present

## 2018-09-12 DIAGNOSIS — I2510 Atherosclerotic heart disease of native coronary artery without angina pectoris: Secondary | ICD-10-CM | POA: Insufficient documentation

## 2018-09-12 DIAGNOSIS — I120 Hypertensive chronic kidney disease with stage 5 chronic kidney disease or end stage renal disease: Secondary | ICD-10-CM | POA: Insufficient documentation

## 2018-09-12 DIAGNOSIS — E1151 Type 2 diabetes mellitus with diabetic peripheral angiopathy without gangrene: Secondary | ICD-10-CM | POA: Diagnosis not present

## 2018-09-12 DIAGNOSIS — N390 Urinary tract infection, site not specified: Secondary | ICD-10-CM | POA: Diagnosis not present

## 2018-09-12 DIAGNOSIS — M47816 Spondylosis without myelopathy or radiculopathy, lumbar region: Secondary | ICD-10-CM

## 2018-09-12 DIAGNOSIS — Z955 Presence of coronary angioplasty implant and graft: Secondary | ICD-10-CM | POA: Insufficient documentation

## 2018-09-12 DIAGNOSIS — D631 Anemia in chronic kidney disease: Secondary | ICD-10-CM | POA: Diagnosis not present

## 2018-09-12 DIAGNOSIS — M79604 Pain in right leg: Secondary | ICD-10-CM | POA: Insufficient documentation

## 2018-09-12 DIAGNOSIS — M797 Fibromyalgia: Secondary | ICD-10-CM | POA: Diagnosis not present

## 2018-09-12 DIAGNOSIS — G4733 Obstructive sleep apnea (adult) (pediatric): Secondary | ICD-10-CM | POA: Diagnosis not present

## 2018-09-12 DIAGNOSIS — I639 Cerebral infarction, unspecified: Secondary | ICD-10-CM | POA: Diagnosis not present

## 2018-09-12 DIAGNOSIS — N2581 Secondary hyperparathyroidism of renal origin: Secondary | ICD-10-CM | POA: Diagnosis not present

## 2018-09-12 DIAGNOSIS — Z87891 Personal history of nicotine dependence: Secondary | ICD-10-CM | POA: Diagnosis not present

## 2018-09-12 DIAGNOSIS — N182 Chronic kidney disease, stage 2 (mild): Secondary | ICD-10-CM | POA: Diagnosis not present

## 2018-09-12 DIAGNOSIS — E785 Hyperlipidemia, unspecified: Secondary | ICD-10-CM | POA: Diagnosis not present

## 2018-09-12 DIAGNOSIS — Z9851 Tubal ligation status: Secondary | ICD-10-CM | POA: Insufficient documentation

## 2018-09-12 DIAGNOSIS — M7591 Shoulder lesion, unspecified, right shoulder: Secondary | ICD-10-CM | POA: Insufficient documentation

## 2018-09-12 DIAGNOSIS — M170 Bilateral primary osteoarthritis of knee: Secondary | ICD-10-CM | POA: Insufficient documentation

## 2018-09-12 DIAGNOSIS — R918 Other nonspecific abnormal finding of lung field: Secondary | ICD-10-CM | POA: Diagnosis not present

## 2018-09-12 DIAGNOSIS — J449 Chronic obstructive pulmonary disease, unspecified: Secondary | ICD-10-CM | POA: Diagnosis not present

## 2018-09-12 DIAGNOSIS — M545 Low back pain: Secondary | ICD-10-CM | POA: Insufficient documentation

## 2018-09-12 DIAGNOSIS — M1711 Unilateral primary osteoarthritis, right knee: Secondary | ICD-10-CM

## 2018-09-12 DIAGNOSIS — Z9049 Acquired absence of other specified parts of digestive tract: Secondary | ICD-10-CM | POA: Diagnosis not present

## 2018-09-12 DIAGNOSIS — I4891 Unspecified atrial fibrillation: Secondary | ICD-10-CM | POA: Diagnosis not present

## 2018-09-12 DIAGNOSIS — M069 Rheumatoid arthritis, unspecified: Secondary | ICD-10-CM | POA: Insufficient documentation

## 2018-09-12 DIAGNOSIS — I129 Hypertensive chronic kidney disease with stage 1 through stage 4 chronic kidney disease, or unspecified chronic kidney disease: Secondary | ICD-10-CM | POA: Diagnosis not present

## 2018-09-12 DIAGNOSIS — Z951 Presence of aortocoronary bypass graft: Secondary | ICD-10-CM | POA: Diagnosis not present

## 2018-09-12 DIAGNOSIS — Z8673 Personal history of transient ischemic attack (TIA), and cerebral infarction without residual deficits: Secondary | ICD-10-CM | POA: Diagnosis not present

## 2018-09-12 MED ORDER — OXYCODONE HCL 5 MG PO TABS: 5.0000 mg | ORAL_TABLET | Freq: Three times a day (TID) | ORAL | 0 refills | Status: DC | PRN

## 2018-09-12 NOTE — Progress Notes (Signed)
Subjective:    Patient ID: Katherine [name], female    DOB: 21-Jul-1956, 63 y.o.   MRN: [id number]  HPI   Katherine [name] is here in follow up of her chronic pain. She has been dealing with a URI for the last few weeks. She has been seen by her pulmonary and primary doctors. She is completing 10 days of doxycycline.   Her pain levels have been fairly stable. Her fibromyalgia pain is general in nature. Her low back pain is bothersome during standing, walking and general activities. It's more axial than radiating. She was helping her husband hold something the other day and felt a "pop"/sudden pain in her back. She had to sit down and the pain eventually backed off.   For pain she remains on percocet as cymbalta, flexeril. She had her percocet refilled on 1/13      Pain Inventory Average Pain 8 Pain Right Now 5 My pain is sharp, burning, stabbing, tingling and aching  In the last 24 hours, has pain interfered with the following? General activity 7 Relation with others 7 Enjoyment of life 7 What TIME of day is your pain at its worst? night Sleep (in general) Fair  Pain is worse with: walking, bending, inactivity and some activites Pain improves with: rest, heat/ice and medication Relief from Meds: 9  Mobility walk with assistance use a cane use a walker do you drive?  yes  Function disabled: date disabled 1989 I need assistance with the following:  household duties and shopping  Neuro/Psych bowel control problems weakness numbness tingling trouble walking spasms depression anxiety  Prior Studies Any changes since last visit?  no  Physicians involved in your care Any changes since last visit?  no   Family History  Problem Relation Age of Onset  . Cancer Mother        liver  . Heart disease Father   . Cancer Father        colon  . Arrhythmia Brother        Atrial Fibrillation  . Arrhythmia Paternal Aunt        Atrial Fibrillation   Social History    Socioeconomic History  . Marital status: Married    Spouse name: Not on file  . Number of children: Not on file  . Years of education: Not on file  . Highest education level: Not on file  Occupational History  . Not on file  Social Needs  . Financial resource strain: Not on file  . Food insecurity:    Worry: Not on file    Inability: Not on file  . Transportation needs:    Medical: Not on file    Non-medical: Not on file  Tobacco Use  . Smoking status: Former Smoker    Packs/day: 1.00    Years: 30.00    Pack years: 30.00    Types: Cigarettes    Last attempt to quit: 08/17/2002    Years since quitting: 16.0  . Smokeless tobacco: Never Used  Substance and Sexual Activity  . Alcohol use: No  . Drug use: No  . Sexual activity: Not on file  Lifestyle  . Physical activity:    Days per week: Not on file    Minutes per session: Not on file  . Stress: Not on file  Relationships  . Social connections:    Talks on phone: Not on file    Gets together: Not on file    Attends religious service: Not on file  Active member of club or organization: Not on file    Attends meetings of clubs or organizations: Not on file    Relationship status: Not on file  Other Topics Concern  . Not on file  Social History Narrative   She is currently married, and the caregiver of her husband who is recovering from surgery for tongue cancer now diagnosed with lung cancer. Prior to his diagnosis of her husband, she actually had adopted a 32-year-old child who she knows caring for as well. With all the surgeries, they have been quite financially troubled. Thanks the help of her community and church, they have been able to stay "alfoat."     She is a former smoker who quit in 2004 after a 30-pack-year history.   She is active chasing a 35-year-old child, does not do routine exercise. She's been quite depressed with the condition of her husband, and admits to eating comfort herself.   She does not drink  alcohol.   Past Surgical History:  Procedure Laterality Date  . ABDOMINAL AORTOGRAM N/A 04/21/2018   Procedure: ABDOMINAL AORTOGRAM;  Surgeon: [name], MD;  Location: [place] [facility];  Service: Cardiovascular;  Laterality: N/A;  . CARDIAC CATHETERIZATION  5/'01, 3/'02, 8/'03, 10/'04; 1/'15   08/22/2013: LAD & RCA 100%; LIMA-LAD & SVG-rPDA patent; Cx-- OM1 60%, OM2 ostial ~50%; SVG-D1 - 80% mid, 50% distal ISR --PCI  . CATHETER REMOVAL    . CHOLECYSTECTOMY N/A 10/29/2014   Procedure: LAPAROSCOPIC CHOLECYSTECTOMY WITH INTRAOPERATIVE CHOLANGIOGRAM;  Surgeon: Excell Seltzer, MD;  Location: [facility];  Service: General;  Laterality: N/A;  . CORONARY ANGIOPLASTY  1994   x5  . CORONARY ARTERY BYPASS GRAFT  1995   LIMA-LAD, SVG-RPDA, SVG-D1  . ESOPHAGOGASTRODUODENOSCOPY N/A 10/15/2016   Procedure: ESOPHAGOGASTRODUODENOSCOPY (EGD);  Surgeon: Wilford Corner, MD;  Location: [facility];  Service: Endoscopy;  Laterality: N/A;  . I&D EXTREMITY Right 01/29/2018   Procedure: IRRIGATION AND DEBRIDEMENT THUMB;  Surgeon: Dayna Barker, MD;  Location: [place];  Service: Plastics;  Laterality: Right;  . INCISE AND DRAIN ABCESS    . KIDNEY TRANSPLANT  1991  . KNEE ARTHROSCOPY WITH LATERAL MENISECTOMY Left 12/03/2017   Procedure: LEFT KNEE ARTHROSCOPY WITH LATERAL MENISECTOMY;  Surgeon: Earlie Server, MD;  Location: [place];  Service: Orthopedics;  Laterality: Left;  . LAPAROSCOPIC GASTRIC BANDING  04/2004; 10/'09, 2/'10   Port Replacement x 2  . LEFT HEART CATH AND CORS/GRAFTS ANGIOGRAPHY N/A 04/21/2018   Procedure: LEFT HEART CATH AND CORS/GRAFTS ANGIOGRAPHY;  Surgeon: [name], MD;  Location: [place] [facility];  Service: Cardiovascular;  Laterality: N/A;  . LEFT HEART CATHETERIZATION WITH CORONARY/GRAFT ANGIOGRAM N/A 08/23/2013   Procedure: LEFT HEART CATHETERIZATION WITH Beatrix Fetters;  Surgeon: Wellington Hampshire, MD;  Location: [place] [facility];  Service: Cardiovascular;  Laterality: N/A;    . Lower Extremity Arterial Dopplers  08/2013   ABI: R 0.96, L 1.04  . MULTIPLE TOOTH EXTRACTIONS  age 44  . NM MYOVIEW LTD  03/2016   EF 62%. LOW RISK. C/W prior MI - no Ischemia. Apical hypokinesis.  [name] Kitchen PERCUTANEOUS CORONARY STENT INTERVENTION (PCI-S)  5/'01, 3/'02, 8/'03, 10/'04;   '01 - S660 BMS 2.5 x 9 - dSVG-D1 into D1; '02- post-stent stenosis - 2.5 x 8 Pixel BMS; '8\03: ISR/Thrombosis into native D1 - AngioJet, 2.5 x 13 Pixel; '04 - ISR 95% - covered stented area with Taxus DES 2.5 mm x 20 (2.88)  . PERCUTANEOUS CORONARY STENT INTERVENTION (PCI-S)  08/23/2013   Procedure: PERCUTANEOUS CORONARY STENT INTERVENTION (PCI-S);  Surgeon: Wellington Hampshire, MD;  Location: [facility] C[facility];  Service: Cardiovascular;;mid SVG-D1 80%; distal stent ~50% ISR; Promus Prermier DES 2.75 mm xc 20 mm (2.8 mm)  . PORT-A-CATH REMOVAL     kidney  . TRANSTHORACIC ECHOCARDIOGRAM  02/2016   EF 55-60%. Septal dyssynergy from CABG GR 2 DD. Aortic valve is trileaflet the functional bicuspid with sclerosis but not yet notable for stenosis stenosis.; no Significant change  . TUBAL LIGATION    . wrist fistula repair Left    dialysis for one year   Past Medical History:  Diagnosis Date  . Anemia   . Anxiety   . Bilateral carotid artery stenosis    Carotid duplex 04/7672: 4-19% LICA, 37-90% RICA, >24% RECA, f/u 1 yr suggested  . CAD (coronary artery disease) of bypass graft 5/01; 3/'02, 8/'03, 10/'04; 1/15   PCI x 5 to SVG-D1   . CAD in native artery 07/1993    3 Vessel Disease (LAD-D1 & RCA) -- CABG  . CAD S/P percutaneous coronary angioplasty    PCI to SVG-D1 insertion/native D1 x 4 = '01 -(S660 BMS 2.5 x 9 - insertion into D1; '02 - distal overlap ACS Pixel 2.5 x 8  BMS; '03 distal/native ISR/Thrombosis - Pixel 2.5 x 13; '04 - ISR-  Taxus 2.5 x 20 (covered all);; 1/15 - mid SVG-D1 (50% distal ISR) - Promus P 2.75 x 20 -- 2.8 mm  . COPD mixed type ([place])    Followed by Dr. Lamonte Sakai "pulmonologist said no COPD"  .  Depression   . Depression with anxiety   . Diabetes mellitus type 2 in obese ([place])   . Diarrhea    started after cholecystectomy and mass removed from intestine  . Dyslipidemia, goal LDL below 70    08/2012: TC 137, TG 200, HDL 32!, LDL 45; on statin (followed by Dr.Deterding)  . ESRD (end stage renal disease) ([place]) 1991   s/p Cadaveric Renal Transplant [facility] - Dr. Jimmy Footman)   . Family history of adverse reaction to anesthesia    mom's bp dropped during/after anesthesia  . Fibromyalgia   . GERD (gastroesophageal reflux disease)   . Glomerulonephritis, chronic, rapidly progressive 29  . 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 07/1993   Rescue PTCA of RCA -- referred for CABG.  . H/O: GI bleed   . Headache    migraines in the past  . History of kidney stones   . Hypertension associated with diabetes ([place])   . MRSA (methicillin resistant staph aureus) culture positive   . Obesity    s/p Lap Band 04/2004- Port Replacement 10/09 & 2/10 for infection; Dr. Excell Seltzer.  . OSA (obstructive sleep apnea)    no longer on CPAP or home O2, states she doesn't need now after lap band  . PAD (peripheral artery disease) ([place]) 08/2013   LEA Dopplers to be read by Dr. Fletcher Anon  . PAF (paroxysmal atrial fibrillation) ([place]) 06/2014   Noted on CardioNet Monitor  - rate ~112; no recurrent symptoms. Nonionic regulation because of frequent GI procedures. Patient prefers Plavix  . Pneumonia   . Recurrent boils    Bilateral Groin  . Rheumatoid arthritis ([place])    Per Patient Report; associated with OA  . 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 Servando Snare: LIMA-LAD, SVG-bifurcatingD1, SVG-rPDA  . S/p cadaver renal transplant [place]  . Stroke [facility]) 2012   "right eye  stroke- half blind now"  . Unstable angina ([place]) 5/01; 3/'02, 8/'03, 10/'04; 1/15   x 5 occurences since Inf-Post STEMI in 1994   BP 115/68   Pulse 64   Ht 5\' 1"  (1.549 m)   Wt 245 lb (111.1 kg)   SpO2 (!) 64%   BMI 46.29  kg/m   Opioid Risk Score:   Fall Risk Score:  `1  Depression screen PHQ 2/9  Depression screen [facility] 2/9 07/04/2018 03/29/2017 05/19/2016 03/30/2016  Decreased Interest 3 3 3 3   Down, Depressed, Hopeless 3 3 3 3   PHQ - 2 Score 6 6 6 6   Altered sleeping 3 - - 3  Tired, decreased energy 3 - - 3  Change in appetite 2 - - 2  Feeling bad or failure about yourself  3 - - 3  Trouble concentrating 2 - - 2  Moving slowly or fidgety/restless 0 - - 0  Suicidal thoughts 0 - - 0  PHQ-9 Score 19 - - 19  Difficult doing work/chores Very difficult - - -  Some recent data might be hidden     Review of Systems  Constitutional: Positive for diaphoresis and unexpected weight change.  HENT: Negative.   Eyes: Negative.   Respiratory: Positive for cough, shortness of breath and wheezing.   Cardiovascular: Negative.   Gastrointestinal: Positive for abdominal pain, diarrhea and nausea.  Endocrine: Negative.   Genitourinary: Positive for dysuria.  Musculoskeletal: Positive for arthralgias, gait problem and myalgias.  Skin: Negative.   Allergic/Immunologic: Negative.   Hematological: Bruises/bleeds easily.  Psychiatric/Behavioral: Positive for dysphoric mood. The patient is nervous/anxious.   All other systems reviewed and are negative.      Objective:   Physical Exam General: No acute distress HEENT: EOMI, oral membranes moist Cards: reg rate  Chest: normal effort Abdomen: Soft, NT, ND Skin: dry, intact Extremities: no edema  Neuro:CN normal. Motor 5/5. No focal sensory loss Musculoskeletal: antalgic gait, wide based. . Low back rom remains limited. LB is TTP Psych:pleasant       Assessment & Plan:  1. Fibromyalgia 2. Rheumatoid Arthritis by history 3. Osteoarthritis--polyarticular, bilateral knees quite involved, left more than right 4. ESRD with hx of renal transplant on chronic steroids 5. Right rotator cuff tendonitis/like degenerative/inflammatory changes in the right  shoulder also.  6. Hx o falls,gait disorder 7. Right low back pain with radicular symptoms. spondylosis on most recent plain film 8. SCC of right thumb 9. Shingles right lower back.   Plan: 1. Discussed safety and . 2. oxycodone 5mg  q8 prn, #60--Rx was provided on 1/13. I gave a second for next month We will continue the controlled substance monitoring program, this consists of regular clinic visits, examinations, routine drug screening, pill counts as well as use of [place] Controlled Substance Reporting System. [place].   3. Continuecymbalta to 60mg  daily,continuezoloftat100mg as well  4. Continue tegretol200mg  BID for FMS/neuropathic pain 6.Left knee mgt per Dr. French Ana 8.Lumbar injections are still on the table but given the recent issues with her atrial fibrillation and the fact that she is on Coumadin/plavix, injections remain on hold.  9.  Continue with Dr. Sima Matas for coping with recent pain/loss of brother.    15minutes of face to face patient care time were spent during this visit. All questions were encouraged and answered. follow up in about 10 weeks.

## 2018-09-12 NOTE — Patient Instructions (Signed)
MAINTAIN ACTIVITY AS MUCH AS YOU CAN. BE SMART ABOUT WHAT [facility] [place]!!

## 2018-09-20 ENCOUNTER — Ambulatory Visit (INDEPENDENT_AMBULATORY_CARE_PROVIDER_SITE_OTHER): Payer: Medicare Other

## 2018-09-20 DIAGNOSIS — I480 Paroxysmal atrial fibrillation: Secondary | ICD-10-CM | POA: Diagnosis not present

## 2018-09-20 DIAGNOSIS — Z7901 Long term (current) use of anticoagulants: Secondary | ICD-10-CM | POA: Diagnosis not present

## 2018-09-20 LAB — PROTIME-INR
INR: 8.6 (ref 0.8–1.2)
Prothrombin Time: 80.6 s — ABNORMAL HIGH (ref 9.1–12.0)

## 2018-09-20 LAB — POCT INR: INR: 8 — AB (ref 2.0–3.0)

## 2018-09-20 NOTE — Progress Notes (Signed)
Spoke w/ pt.  Advised her to hold coumadin until recheck, try to have some greens today and recheck INR on Friday, 09/23/18.

## 2018-09-20 NOTE — Patient Instructions (Signed)
Since you've already taken coumadin today, please skip coumadin tomorrow.  Please proceed to the lab for STAT blood work.  DO NOT TAKE ANY COUMADIN UNTIL WE CALL YOU W/ RESULTS AND INSTRUCTIONS.  C[facility]in Clinic [phone] call with procedures or new medications.

## 2018-09-21 ENCOUNTER — Encounter: Payer: Self-pay | Admitting: Internal Medicine

## 2018-09-21 ENCOUNTER — Ambulatory Visit: Payer: Medicare Other | Admitting: Cardiology

## 2018-09-23 ENCOUNTER — Ambulatory Visit (INDEPENDENT_AMBULATORY_CARE_PROVIDER_SITE_OTHER): Payer: Medicare Other | Admitting: *Deleted

## 2018-09-23 DIAGNOSIS — Z7901 Long term (current) use of anticoagulants: Secondary | ICD-10-CM | POA: Diagnosis not present

## 2018-09-23 DIAGNOSIS — N182 Chronic kidney disease, stage 2 (mild): Secondary | ICD-10-CM | POA: Diagnosis not present

## 2018-09-23 DIAGNOSIS — N189 Chronic kidney disease, unspecified: Secondary | ICD-10-CM | POA: Diagnosis not present

## 2018-09-23 DIAGNOSIS — I480 Paroxysmal atrial fibrillation: Secondary | ICD-10-CM | POA: Diagnosis not present

## 2018-09-23 LAB — POCT INR: INR: 2.6 (ref 2.0–3.0)

## 2018-09-23 NOTE — Patient Instructions (Signed)
Description   Start taking 1 tablet daily except 1/2 tablet on Saturdays and Tuesdays.  Recheck INR in 1 week. Coumadin Clinic [phone] call with procedures or new medications.

## 2018-09-28 ENCOUNTER — Telehealth: Payer: Self-pay | Admitting: Nurse Practitioner

## 2018-09-28 ENCOUNTER — Encounter: Payer: Self-pay | Admitting: Nurse Practitioner

## 2018-09-28 ENCOUNTER — Ambulatory Visit (INDEPENDENT_AMBULATORY_CARE_PROVIDER_SITE_OTHER): Payer: Medicare Other | Admitting: Nurse Practitioner

## 2018-09-28 ENCOUNTER — Ambulatory Visit (INDEPENDENT_AMBULATORY_CARE_PROVIDER_SITE_OTHER)
Admission: RE | Admit: 2018-09-28 | Discharge: 2018-09-28 | Disposition: A | Discharge status: Home / Self Care | Payer: Medicare Other | Source: Ambulatory Visit | Attending: Nurse Practitioner | Admitting: Nurse Practitioner

## 2018-09-28 VITALS — BP 128/74 | HR 71 | Temp 98.1°F | Ht 61.0 in | Wt 253.0 lb

## 2018-09-28 DIAGNOSIS — R0600 Dyspnea, unspecified: Secondary | ICD-10-CM

## 2018-09-28 DIAGNOSIS — R0602 Shortness of breath: Secondary | ICD-10-CM | POA: Diagnosis not present

## 2018-09-28 DIAGNOSIS — R05 Cough: Secondary | ICD-10-CM | POA: Diagnosis not present

## 2018-09-28 DIAGNOSIS — J40 Bronchitis, not specified as acute or chronic: Secondary | ICD-10-CM

## 2018-09-28 LAB — D-DIMER, QUANTITATIVE: D-Dimer, Quant: 0.77 mcg/mL FEU — ABNORMAL HIGH (ref <0.50)

## 2018-09-28 LAB — RESPIRATORY VIRUS PANEL
HMPV: NOT DETECTED
Influenza A RNA: NOT DETECTED
Influenza B RNA: NOT DETECTED
RSV RNA: NOT DETECTED

## 2018-09-28 MED ORDER — ALBUTEROL SULFATE (2.5 MG/3ML) 0.083% IN NEBU
2.5000 mg | INHALATION_SOLUTION | Freq: Once | RESPIRATORY_TRACT | Status: AC
  Administered 2018-09-28: 2.5 mg via RESPIRATORY_TRACT

## 2018-09-28 MED ORDER — CEFDINIR 300 MG PO CAPS: 300.0000 mg | ORAL_CAPSULE | Freq: Two times a day (BID) | ORAL | 0 refills | Status: DC

## 2018-09-28 MED ORDER — METHYLPREDNISOLONE ACETATE 80 MG/ML IJ SUSP
80.0000 mg | Freq: Once | INTRAMUSCULAR | Status: AC
  Administered 2018-09-28: 80 mg via INTRAMUSCULAR

## 2018-09-28 MED ORDER — PREDNISONE 10 MG PO TABS: ORAL_TABLET | ORAL | 0 refills | Status: DC

## 2018-09-28 MED ORDER — ALBUTEROL SULFATE (2.5 MG/3ML) 0.083% IN NEBU: 2.5000 mg | INHALATION_SOLUTION | Freq: Four times a day (QID) | RESPIRATORY_TRACT | 2 refills | Status: DC | PRN

## 2018-09-28 NOTE — Progress Notes (Signed)
@Patient  ID: Katherine [name], female    DOB: 03-26-1956, 63 y.o.   MRN: [id number]  Chief Complaint  Patient presents with  . Acute Visit    prod cough with yellow mucus, wheezing, and chest congestion x2-3 weeks    Referring provider: [name], MD  HPI  63 year old female patient followed in our office for obstructive sleep apnea and restrictive lung disease  PMH: Obesity, status post lap band surgery, renal transplant (on chronic immunosuppression with Imuran and prednisone) Smoker/ Smoking History: Former smoker.  Quit 2004.  30-pack-year.  Dr. Lake Bells Maintenance: Symbicort 160 Pt of: Dr. Lamonte Sakai  Tests: 07/04/2018-chest x-ray-chronic bronchitic changes no alveolar pneumonia or CHF  10/27/2017-echocardiogram- LV ejection fraction 55 to 60%, moderate LVH, grade 2 diastolic dysfunction  10/20/4654-[id number]-VCB 2 (69% predicted), ratio 80%, FEV1 72%  PFT: PFT Results Latest Ref Rng & Units 07/17/2016  FVC-Pre L 2.51  FVC-Predicted Pre % 84  FVC-Post L 2.53  FVC-Predicted Post % 85  Pre FEV1/FVC % % 80  Post FEV1/FCV % % 86  FEV1-Pre L 2.02  FEV1-Predicted Pre % 87  FEV1-Post L 2.17  DLCO UNC% % 68  DLCO COR %Predicted % 86  TLC L 3.91  TLC % Predicted % 84  RV % Predicted % 75    OV 09/29/18 - Acute chest congestion, wheezing, cough Presents to the office today for acute visit.  She reports chest congestion, cough, and wheezing for the past 2 weeks.  She was seen by her PCP around 3 weeks ago was diagnosed with strep throat.  She was given doxycycline at that visit.  She states that her throat is no longer sore but her other symptoms are progressively worsening.  She reports productive cough that is worse in the morning and is productive of yellow mucus.  He also complains today of worsening wheeze with exertion and at times when lying down.  She reports increased fatigue.  Patient has multiple comorbid conditions that contribute to fatigue.  She also  complains today of sinus pressure and pain/congestion.  Patient states that she has been compliant with Symbicort and Proventil as needed.  She does take Flonase and Singulair daily.  Denies any recent fever.  She denies any chest pain or edema.     Allergies  Allergen Reactions  . Tetracycline Hives  . Hydromorphone Hcl Nausea And Vomiting  . Niacin Other (See Comments)    Mouth blisters  . Niaspan [Niacin Er] Other (See Comments)    Mouth blisters  . Sulfa Antibiotics Nausea Only and Other (See Comments)    "Tears up stomach"  . Sulfonamide Derivatives Other (See Comments)    Reaction: per patient "tears her stomach up"  . Codeine Nausea And Vomiting  . Erythromycin Nausea And Vomiting  . Hydromorphone Hcl Nausea And Vomiting  . Morphine And Related Nausea And Vomiting  . Nalbuphine Nausea And Vomiting  . Sulfasalazine Nausea Only and Other (See Comments)    per patient "tears her stomach up", "Tears up stomach"  . Tape Rash and Other (See Comments)    No "plastic" tape," please    Immunization History  Administered Date(s) Administered  . Influenza Split 05/11/2012, 05/17/2016, 05/17/2017  . Influenza Whole 06/17/2009, 06/17/2010, 04/18/2011  . Influenza,inj,Quad PF,6+ Mos 08/24/2013, 05/20/2015, 04/21/2018  . Pneumococcal Polysaccharide-23 11/30/2017  . Tdap 11/30/2017  . Zoster Recombinat (Shingrix) 07/04/2018    Past Medical History:  Diagnosis Date  . Anemia   . Anxiety   . Bilateral carotid  artery stenosis    Carotid duplex 12/4560: 5-63% LICA, 89-37% RICA, >34% RECA, f/u 1 yr suggested  . CAD (coronary artery disease) of bypass graft 5/01; 3/'02, 8/'03, 10/'04; 1/15   PCI x 5 to SVG-D1   . CAD in native artery 07/1993    3 Vessel Disease (LAD-D1 & RCA) -- CABG  . CAD S/P percutaneous coronary angioplasty    PCI to SVG-D1 insertion/native D1 x 4 = '01 -(S660 BMS 2.5 x 9 - insertion into D1; '02 - distal overlap ACS Pixel 2.5 x 8  BMS; '03 distal/native  ISR/Thrombosis - Pixel 2.5 x 13; '04 - ISR-  Taxus 2.5 x 20 (covered all);; 1/15 - mid SVG-D1 (50% distal ISR) - Promus P 2.75 x 20 -- 2.8 mm  . COPD mixed type ([place])    Followed by Dr. Lamonte Sakai "pulmonologist said no COPD"  . Depression   . Depression with anxiety   . Diabetes mellitus type 2 in obese ([place])   . Diarrhea    started after cholecystectomy and mass removed from intestine  . Dyslipidemia, goal LDL below 70    08/2012: TC 137, TG 200, HDL 32!, LDL 45; on statin (followed by Dr.Deterding)  . ESRD (end stage renal disease) ([place]) 1991   s/p Cadaveric Renal Transplant [facility] - Dr. Jimmy Footman)   . Family history of adverse reaction to anesthesia    mom's bp dropped during/after anesthesia  . Fibromyalgia   . GERD (gastroesophageal reflux disease)   . Glomerulonephritis, chronic, rapidly progressive 60  . 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 07/1993   Rescue PTCA of RCA -- referred for CABG.  . H/O: GI bleed   . Headache    migraines in the past  . History of kidney stones   . Hypertension associated with diabetes ([place])   . MRSA (methicillin resistant staph aureus) culture positive   . Obesity    s/p Lap Band 04/2004- Port Replacement 10/09 & 2/10 for infection; Dr. Excell Seltzer.  . OSA (obstructive sleep apnea)    no longer on CPAP or home O2, states she doesn't need now after lap band  . PAD (peripheral artery disease) ([place]) 08/2013   LEA Dopplers to be read by Dr. Fletcher Anon  . PAF (paroxysmal atrial fibrillation) ([place]) 06/2014   Noted on CardioNet Monitor  - rate ~112; no recurrent symptoms. Nonionic regulation because of frequent GI procedures. Patient prefers Plavix  . Pneumonia   . Recurrent boils    Bilateral Groin  . Rheumatoid arthritis ([place])    Per Patient Report; associated with OA  . 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 Servando Snare: LIMA-LAD, SVG-bifurcatingD1, SVG-rPDA  . S/p cadaver renal transplant [place]  . Stroke [facility]) 2012   "right eye  stroke- half blind now"  . Unstable angina ([place]) 5/01; 3/'02, 8/'03, 10/'04; 1/15   x 5 occurences since Inf-Post STEMI in 1994    Tobacco History: Social History   Tobacco Use  Smoking Status Former Smoker  . Packs/day: 1.00  . Years: 30.00  . Pack years: 30.00  . Types: Cigarettes  . Last attempt to quit: 08/17/2002  . Years since quitting: 16.1  Smokeless Tobacco Never Used   Counseling given: Yes   Outpatient Encounter Medications as of 09/28/2018  Medication Sig  . acetaminophen (TYLENOL) 500 MG tablet Take 1 tablet (500 mg total) by mouth every 6 (six) hours as needed for moderate pain or headache. (Patient taking differently: Take 1,000  mg by mouth every 6 (six) hours as needed for moderate pain or headache. )  . albuterol (VENTOLIN HFA) 108 (90 Base) MCG/ACT inhaler Inhale 2 puffs into the lungs every 6 (six) hours as needed for wheezing or shortness of breath.  . azaTHIOprine (IMURAN) 50 MG tablet Take 125 mg by mouth every evening.   . budesonide-formoterol (SYMBICORT) 160-4.5 MCG/ACT inhaler Inhale 2 puffs into the lungs 2 (two) times daily.  . calcitRIOL (ROCALTROL) 0.25 MCG capsule Take 0.25 mcg by mouth every 3 (three) days.   . carbamazepine (TEGRETOL) 200 MG tablet TAKE 1 TABLET BY MOUTH TWICE A DAY  . clopidogrel (PLAVIX) 75 MG tablet TAKE 1 TABLET BY MOUTH EVERY DAY  . clotrimazole (MYCELEX) 10 MG troche Take 1 tablet (10 mg total) by mouth 5 (five) times daily.  . cyclobenzaprine (FLEXERIL) 10 MG tablet TAKE 1 TABLET BY MOUTH 3 TIMES DAILY AS NEEDED FOR MUSCLE SPASMS (Patient taking differently: Take 10 mg by mouth 2 (two) times daily. )  . diltiazem (CARDIZEM CD) 120 MG 24 hr capsule TAKE 1 CAPSULE (120 MG TOTAL) BY MOUTH DAILY. NEED OV. (Patient taking differently: Take 120 mg by mouth daily. )  . dofetilide (TIKOSYN) 250 MCG capsule Take 1 capsule (250 mcg total) by mouth 2 (two) times daily.  . DULoxetine (CYMBALTA) 30 MG capsule TAKE 2 CAPSULES (60 MG TOTAL) BY  MOUTH EVERY MORNING.  . famotidine (PEPCID) 20 MG tablet Take 20 mg by mouth daily.  . ferrous sulfate 325 (65 FE) MG tablet Take 1 tablet (325 mg total) by mouth 3 (three) times daily with meals.  . fluticasone (FLONASE) 50 MCG/ACT nasal spray Place 2 sprays into both nostrils 2 (two) times daily.  [name] Kitchen glimepiride (AMARYL) 4 MG tablet Take 4 mg by mouth every morning.  [name] Kitchen guaiFENesin (MUCINEX) 600 MG 12 hr tablet Take 1 tablet (600 mg total) by mouth 2 (two) times daily.  . Insulin Glargine (BASAGLAR KWIKPEN) 100 UNIT/ML SOPN Inject 12 Units into the skin daily.  [name] Kitchen LORazepam (ATIVAN) 1 MG tablet Take 1 mg by mouth every morning.   . metFORMIN (GLUCOPHAGE) 500 MG tablet Take 500 mg by mouth 2 (two) times daily.  . metoprolol tartrate (LOPRESSOR) 25 MG tablet TAKE 1 TABLET BY MOUTH TWICE A DAY  . montelukast (SINGULAIR) 10 MG tablet TAKE 1 TABLET BY MOUTH EVERY DAY  . nitroGLYCERIN (NITROSTAT) 0.4 MG SL tablet Place 1 tablet (0.4 mg total) under the tongue every 5 (five) minutes as needed for chest pain.  [name] Kitchen NOVOLOG FLEXPEN 100 UNIT/ML FlexPen Inject 4 Units into the skin 2 (two) times daily as needed for high blood sugar.   . omeprazole (PRILOSEC) 40 MG capsule Take 40 mg by mouth every morning.   [name] Kitchen oxyCODONE (OXY IR/ROXICODONE) 5 MG immediate release tablet Take 1 tablet (5 mg total) by mouth every 8 (eight) hours as needed for severe pain.  . predniSONE (DELTASONE) 5 MG tablet Take 5 mg by mouth daily with breakfast.  . promethazine (PHENERGAN) 25 MG tablet Take 1 tablet (25 mg total) by mouth every 6 (six) hours as needed for nausea or vomiting (or sleep).  . rosuvastatin (CRESTOR) 20 MG tablet TAKE 1 TABLET BY MOUTH EVERY DAY  . sertraline (ZOLOFT) 100 MG tablet Take 100 mg by mouth every morning.  . warfarin (COUMADIN) 10 MG tablet TAKE 1 TABLET DAILY OR AS DIRECTED BY COUMADIN CLINIC  . albuterol (PROVENTIL) (2.5 MG/3ML) 0.083% nebulizer solution Take 3 mLs (2.5 mg  total) by nebulization every  6 (six) hours as needed for wheezing or shortness of breath.  . cefdinir (OMNICEF) 300 MG capsule Take 1 capsule (300 mg total) by mouth 2 (two) times daily.  . predniSONE (DELTASONE) 10 MG tablet Take 4 tabs for 2 days, then 3 tabs for 2 days, then 2 tabs for 2 days, then 1 tab for 2 days, then stop  . [DISCONTINUED] doxycycline (VIBRA-TABS) 100 MG tablet Take 1 tablet (100 mg total) by mouth 2 (two) times daily. (Patient not taking: Reported on 09/28/2018)  . [DISCONTINUED] predniSONE (DELTASONE) 10 MG tablet Take 2 tablets (20mg  total) daily for the next 5 days. Take in the AM with food. (Patient not taking: Reported on 09/28/2018)  . [EXPIRED] albuterol (PROVENTIL) (2.5 MG/3ML) 0.083% nebulizer solution 2.5 mg   . [EXPIRED] methylPREDNISolone acetate (DEPO-MEDROL) injection 80 mg    No facility-administered encounter medications on file as of 09/28/2018.      Review of Systems  Review of Systems  Constitutional: Negative for chills and fever.  HENT: Positive for congestion, postnasal drip, sinus pressure and sinus pain.   Respiratory: Positive for cough and wheezing. Negative for shortness of breath.   Cardiovascular: Negative.  Negative for chest pain, palpitations and leg swelling.  Gastrointestinal: Negative.   Allergic/Immunologic: Negative.   Neurological: Negative.   Psychiatric/Behavioral: Negative.        Physical Exam  BP 128/74 (BP Location: Right Arm, Cuff Size: Normal)   Pulse 71   Temp 98.1 F (36.7 C) (Oral)   Ht 5\' 1"  (1.549 m)   Wt 253 lb (114.8 kg)   SpO2 92%   BMI 47.80 kg/m   Wt Readings from Last 5 Encounters:  09/28/18 253 lb (114.8 kg)  09/12/18 245 lb (111.1 kg)  09/08/18 243 lb (110.2 kg)  08/04/18 245 lb (111.1 kg)  07/04/18 245 lb (111.1 kg)     Physical Exam Vitals signs and nursing note reviewed.  Constitutional:      General: She is not in acute distress.    Appearance: She is well-developed.  Cardiovascular:     Rate and Rhythm:  Normal rate and regular rhythm.  Pulmonary:     Effort: Pulmonary effort is normal. No respiratory distress.     Breath sounds: Normal breath sounds. No wheezing or rhonchi.  Musculoskeletal:        General: No swelling.  Neurological:     Mental Status: She is alert and oriented to person, place, and time.        Assessment & Plan:   Bronchitis: 63 year old female patient completing acute visit with our office today.  Patient will be treated as Bronchitis with underlying sinusitis.  Patient with persistent sinus pain on assessment.  Will order prednisone, then patient resume her daily dose of 5 mg.   Patient Instructions  Will order chest x ray and call with results Will order respiratory viral panel  Albuterol neb given in office today Will order albuterol neb treatments every 6 hours as needed for home DepoMedrol 80 mg given in office today Continue Symbicort, Singulair and flonase Will order prednisone taper to start tomorrow - stop current daily dose and restart once finished with taper Follow up with Dr. Lamonte Sakai at his first available appointment or sooner if needed            Fenton Foy, NP 09/29/2018

## 2018-09-28 NOTE — Patient Instructions (Addendum)
Will order chest x ray and call with results Will order respiratory viral panel  Albuterol neb given in office today Will order albuterol neb treatments every 6 hours as needed for home DepoMedrol 80 mg given in office today Continue Symbicort, Singulair and flonase Will order prednisone taper to start tomorrow - stop current daily dose and restart once finished with taper Follow up with Dr. Lamonte Sakai at his first available appointment or sooner if needed

## 2018-09-28 NOTE — Telephone Encounter (Signed)
Notes recorded by Fenton Foy, NP on 09/28/2018 at 1:22 PM EST Please call to let patient know that her chest x ray showed bronchitis. It did not show and pneumonia. Considering symptoms with chest and sinus congestion - I will send in an antibiotic. Hopefully she will be feeling better soon. I do not have the lab abck yet. _____________________________________________________________________________________  Hulen Skains and spoke with patient, she is aware of results and verbalized understanding. Nothing further needed.

## 2018-09-29 ENCOUNTER — Telehealth: Payer: Self-pay | Admitting: Nurse Practitioner

## 2018-09-29 ENCOUNTER — Encounter: Payer: Self-pay | Admitting: Nurse Practitioner

## 2018-09-29 NOTE — Telephone Encounter (Signed)
Called and spoke to patient. Patient stated she was asleep when whomever called yesterday to give results and that husband told her to call back to get the results today. Relayed results per Kenney Houseman, NP. Patient verbalized understanding and reported she feels horrible and hopes to feel better soon. Patient stated she has started taking the antibiotic.  Nothing further needed at this time.

## 2018-09-30 ENCOUNTER — Telehealth: Payer: Self-pay | Admitting: Nurse Practitioner

## 2018-09-30 NOTE — Telephone Encounter (Signed)
I just called and spoke with Tiffany with [employer] and she stated that the CMN was faxed yesterday at 9:30 something. When she went into their system it still showed that it was being faxed since yesterday. I now have the form and going to get Tonya to sign it

## 2018-09-30 NOTE — Telephone Encounter (Signed)
Don't have any other CMN's for Tonya to sign. Wonder where they faxed it.

## 2018-09-30 NOTE — Telephone Encounter (Signed)
Kenney Houseman has now signed this CMN form and I have faxed it back to [place]. I have received confirmation that the fax was received

## 2018-09-30 NOTE — Telephone Encounter (Signed)
Spoke with pt, she states [employer] needed [place] to call them. I called [employer] and they stated they are waiting on the CMN to be signed by [place]. Rodena Piety do you have this?

## 2018-10-04 ENCOUNTER — Ambulatory Visit: Payer: Medicare Other | Admitting: Internal Medicine

## 2018-10-10 ENCOUNTER — Ambulatory Visit (INDEPENDENT_AMBULATORY_CARE_PROVIDER_SITE_OTHER): Payer: Medicare Other | Admitting: Emergency Medicine

## 2018-10-10 ENCOUNTER — Encounter: Payer: Self-pay | Admitting: Emergency Medicine

## 2018-10-10 ENCOUNTER — Ambulatory Visit (INDEPENDENT_AMBULATORY_CARE_PROVIDER_SITE_OTHER): Payer: Medicare Other | Admitting: Pharmacist

## 2018-10-10 VITALS — BP 122/62 | HR 72 | Ht 61.0 in | Wt 252.0 lb

## 2018-10-10 DIAGNOSIS — Z7901 Long term (current) use of anticoagulants: Secondary | ICD-10-CM

## 2018-10-10 DIAGNOSIS — G4730 Sleep apnea, unspecified: Secondary | ICD-10-CM

## 2018-10-10 DIAGNOSIS — I480 Paroxysmal atrial fibrillation: Secondary | ICD-10-CM

## 2018-10-10 DIAGNOSIS — K219 Gastro-esophageal reflux disease without esophagitis: Secondary | ICD-10-CM

## 2018-10-10 DIAGNOSIS — R059 Cough, unspecified: Secondary | ICD-10-CM

## 2018-10-10 DIAGNOSIS — R05 Cough: Secondary | ICD-10-CM

## 2018-10-10 DIAGNOSIS — J309 Allergic rhinitis, unspecified: Secondary | ICD-10-CM

## 2018-10-10 DIAGNOSIS — T8610 Unspecified complication of kidney transplant: Secondary | ICD-10-CM

## 2018-10-10 DIAGNOSIS — J4520 Mild intermittent asthma, uncomplicated: Secondary | ICD-10-CM | POA: Diagnosis not present

## 2018-10-10 LAB — POCT INR: INR: 2 (ref 2.0–3.0)

## 2018-10-10 NOTE — Assessment & Plan Note (Signed)
She has been off of CPAP for several years, was able to stop it when she lost weight.  She now describes periods of waking up choking, suspect at least in part due to upper airway obstruction.  She also has GERD that could be contributing.  I think she needs a split-night sleep study and we will arrange for this.  She is willing to go back to CPAP if it is necessary.

## 2018-10-10 NOTE — Progress Notes (Signed)
63 year old female patient has a known history of obesity, status post lap band surgery with significant weight loss. Patient also has a history of renal transplant on chronic immunosuppression with Imuran and prednisone. The patient has a history of sleep apnea now much improved after significant weight loss.  Still needs repeat PSG to prove that OSA has resolved. She has stopped using CPAP.   Acute OV 06/18/17 --patient is 63 years old, has a history of lap band surgery with associated GERD, former obstructive sleep apnea no longer on CPAP, upper airway irritation syndrome with associated cough and probably also some true obstructive lung disease/asthma.  She had pulmonary nodules noted on CT scan from 10/14/16, apparent resolution by chest x-ray 12/28/16.  Suspected to be infectious in nature, some question of possible chronic aspiration. She just had a benign mass removed surgically, B salpingo-oophorectomy done 9/28. She feels that her chest has been more tight since the procedure. I gave her prednisone last week, seems to be helping some but not at baseline. She has gained wt since saline removed from her lap band, has gained. She is hearing wheeze at night.  She is on symbicort, using albuterol about 2x a day currently.   ROV 10/19/17 --63 year old woman with a history of chronic cough in the setting of GERD, former lap band surgery, allergic rhinitis.  Also with some suspected mild obstruction based on clinical symptoms and curve of her flow volume loop from pulmonary function testing back in 07/17/16.  She has a history of a renal transplant on chronic immunosuppression with Imuran, prednisone 5 mg daily.  She has gained weight since her lap band was released.  She has left knee pain and is preparing to undergo a repair of a torn meniscus.  Currently managed on Symbicort, Singulair, omeprazole, Pepcid, fluticasone nasal spray twice daily.  Her cough is improved since she was treated for an acute flair  10/12/17 with Augmentin and prednisone.  ROV 10/10/2018 -- 63 year old woman with a history of chronic cough in the setting of GERD, history of lap band surgery (status post release with resultant weight gain- it is being adjusted), allergic rhinitis, mild obstructive lung disease.  She has a renal transplant and is on chronic immunosuppression with Imuran and prednisone 5 mg.  6 weeks ago she was treated with doxycycline for possible bronchitis.  She was seen 2/12 with increased/continued congestion, cough, wheeze.  She was treated with a prednisone taper. She describes more energy, decreased cough, a bit better breathing. She has had nocturnal awakenings, associated with burning in her chest, then cough. She has awakened gasping before as well. She is on Symbicort, Singulair, flonase, omeprazole, pepcid.    PE :  Vitals:   10/10/18 1411  BP: 122/62  Pulse: 72  SpO2: 96%  Weight: 252 lb (114.3 kg)  Height: 5\' 1"  (1.549 m)   Gen: Pleasant, obese, has gained wt, in no distress,  normal affect  ENT post pharynx erythema, no purulence  Neck: No JVD, no stridor  Lungs: No use of accessory muscles, distant but no wheeze  Cardiovascular: RRR, heart sounds normal, no murmur or gallops, no peripheral edema  Musculoskeletal: No deformities, no cyanosis or clubbing  Neuro: alert, non focal  Skin: Warm, no lesions or rashes  COUGH, CHRONIC The hallmark of her continued symptoms has been persistent cough.  She has been treated 3 times in the last 2 months for possible bronchitis, asthma flaring.  She describes cough, mucus production, appears to be worst  at night when she is supine.  It can wake her up from sleep and is associated with what she describes his airway burning.  Interestingly she also states she that she "awakes choking".  I suspect that she is having intermittent reflux which is very difficult to manage given her history of gastric banding.  When her band has been loosened she has  gained weight and now is dealing with probable recurrence of her obstructive sleep apnea.  I think she will need a repeat split-night sleep study to sort this out.  In the meantime she needs to be treated aggressively for her reflux.  She plans to speak with Dr. Excell Seltzer about possibly tightening her band to lose weight but clearly this will impact her reflux, probably negatively at least initially.   Continue Singulair, fluticasone nasal spray as you are taking them. Continue omeprazole and Pepcid as you are using it.  Remember to keep the head of your bed on incline. Agree with talking with Dr. Excell Seltzer regarding the pros and cons of tightening your lap band to help facilitate weight loss. You will benefit from other weight loss strategies including slowly and steadily increasing your exercise and modifying her diet. Follow with Dr. Lamonte Sakai in 6 to 8 weeks to review your sleep study results together.   Sleep apnea She has been off of CPAP for several years, was able to stop it when she lost weight.  She now describes periods of waking up choking, suspect at least in part due to upper airway obstruction.  She also has GERD that could be contributing.  I think she needs a split-night sleep study and we will arrange for this.  She is willing to go back to CPAP if it is necessary.  Extrinsic asthma Please continue Symbicort 2 puffs twice daily.  Remember to rinse gargle after using. Keep your albuterol available to use either 1 nebulizer treatment or 2 puffs up to every 4 hours if needed for shortness of breath, chest tightness, wheezing. You do not need to continue budesonide nebulizer treatments.  Please stop these.   Chronic allergic rhinitis Continue Singulair, fluticasone nasal spray as you are taking them.   GERD Continue omeprazole and Pepcid as you are using it.  Remember to keep the head of your bed on incline.  Renal transplant disorder Please continue your Imuran and prednisone as  per your chronic dosing.    Baltazar Apo, MD, PhD 10/10/2018, 5:50 PM [facility] [phone] or if no answer [phone]

## 2018-10-10 NOTE — Assessment & Plan Note (Signed)
Continue Singulair, fluticasone nasal spray as you are taking them.

## 2018-10-10 NOTE — Patient Instructions (Addendum)
Please continue Symbicort 2 puffs twice daily.  Remember to rinse gargle after using. Keep your albuterol available to use either 1 nebulizer treatment or 2 puffs up to every 4 hours if needed for shortness of breath, chest tightness, wheezing. You do not need to continue budesonide nebulizer treatments.  Please stop these. Continue Singulair, fluticasone nasal spray as you are taking them. Continue omeprazole and Pepcid as you are using it.  Remember to keep the head of your bed on incline. We will perform a split-night sleep study and start CPAP if it is indicated. Agree with talking with Dr. Excell Seltzer regarding the pros and cons of tightening your lap band to help facilitate weight loss. You will benefit from other weight loss strategies including slowly and steadily increasing your exercise and modifying her diet. Please continue your Imuran and prednisone as per your chronic dosing. Follow with Dr. Lamonte Sakai in 6 to 8 weeks to review your sleep study results together.

## 2018-10-10 NOTE — Assessment & Plan Note (Signed)
Continue omeprazole and Pepcid as you are using it.  Remember to keep the head of your bed on incline.

## 2018-10-10 NOTE — Assessment & Plan Note (Signed)
Please continue Symbicort 2 puffs twice daily.  Remember to rinse gargle after using. Keep your albuterol available to use either 1 nebulizer treatment or 2 puffs up to every 4 hours if needed for shortness of breath, chest tightness, wheezing. You do not need to continue budesonide nebulizer treatments.  Please stop these.

## 2018-10-10 NOTE — Patient Instructions (Signed)
Description   Cotninue taking 1 tablet daily except 1 and 1/2 tablet on Saturdays.  Recheck INR in 10 days. [facility] [phone] call with procedures or new medications.

## 2018-10-10 NOTE — Assessment & Plan Note (Signed)
The hallmark of her continued symptoms has been persistent cough.  She has been treated 3 times in the last 2 months for possible bronchitis, asthma flaring.  She describes cough, mucus production, appears to be worst at night when she is supine.  It can wake her up from sleep and is associated with what she describes his airway burning.  Interestingly she also states she that she "awakes choking".  I suspect that she is having intermittent reflux which is very difficult to manage given her history of gastric banding.  When her band has been loosened she has gained weight and now is dealing with probable recurrence of her obstructive sleep apnea.  I think she will need a repeat split-night sleep study to sort this out.  In the meantime she needs to be treated aggressively for her reflux.  She plans to speak with Dr. Excell Seltzer about possibly tightening her band to lose weight but clearly this will impact her reflux, probably negatively at least initially.   Continue Singulair, fluticasone nasal spray as you are taking them. Continue omeprazole and Pepcid as you are using it.  Remember to keep the head of your bed on incline. Agree with talking with Dr. Excell Seltzer regarding the pros and cons of tightening your lap band to help facilitate weight loss. You will benefit from other weight loss strategies including slowly and steadily increasing your exercise and modifying her diet. Follow with Dr. Lamonte Sakai in 6 to 8 weeks to review your sleep study results together.

## 2018-10-10 NOTE — Assessment & Plan Note (Signed)
Please continue your Imuran and prednisone as per your chronic dosing.

## 2018-10-17 ENCOUNTER — Ambulatory Visit: Payer: Medicare Other | Admitting: Internal Medicine

## 2018-10-17 ENCOUNTER — Telehealth: Payer: Self-pay | Admitting: Internal Medicine

## 2018-10-17 NOTE — Telephone Encounter (Signed)
Copied from [place] [id number]. Topic: Quick Communication - See Telephone Encounter >> Oct 17, 2018  8:31 AM Ahmed Prima L wrote: CRM for notification. See Telephone encounter for: 10/17/18.  Patient canceled her appt at 10 am with Dr Ronnald Ramp and said that her husband has cancer and can not come because he is not feeling well. She said she rather have Dr Ronnald Ramp call her in an antibiotic for head congestion and a fever. She was advised that she needs an appointment to receive any medications , she then asked me to just have Dr Ronnald Ramp nurse to call her.

## 2018-10-17 NOTE — Telephone Encounter (Signed)
Spoke to pt spouse and informed that she would need an appt.

## 2018-10-18 ENCOUNTER — Telehealth: Payer: Self-pay | Admitting: Emergency Medicine

## 2018-10-18 NOTE — Telephone Encounter (Signed)
Made appt with Beth for 10/21/2018 at 2pm

## 2018-10-18 NOTE — Telephone Encounter (Signed)
She needs to be seen by APP this afternoon, have flu testing, determine which treatments to recommend

## 2018-10-18 NOTE — Telephone Encounter (Signed)
Called pt and advised message from the provider. Pt understood and verbalized understanding. Nothing further is needed.    

## 2018-10-18 NOTE — Telephone Encounter (Signed)
Spoke with pt, she is having severe body aches as if she has the flu. She feels really fatigue and thought when she was given the antibiotic that it would go completely away. It got better but now she feels bad again and feels worse. She is having head congestion, chest congestions, feverish, sweaty, and severe body aches. She thinks she is needing a longer treatment of antibiotics and mentioned that Levaquin has always helped with these symptoms. She is unable to come in and started crying on the phone because she wants to feel better. She wants Dr. Lamonte Sakai to review this message because he knows her and knows what has been going on with her. RB please advise.   [facility]  Patient Instructions by Collene Gobble, MD at 10/10/2018 2:15 PM  Author: Collene Gobble, MD Author Type: Physician Filed: 10/10/2018 2:44 PM  Note Status: Addendum Mickle Mallory: Cosign Not Required Encounter Date: 10/10/2018  Editor: Collene Gobble, MD (Physician)  Prior Versions: 1. Collene Gobble, MD (Physician) at 10/10/2018 2:44 PM - Signed    Please continue Symbicort 2 puffs twice daily.  Remember to rinse gargle after using. Keep your albuterol available to use either 1 nebulizer treatment or 2 puffs up to every 4 hours if needed for shortness of breath, chest tightness, wheezing. You do not need to continue budesonide nebulizer treatments.  Please stop these. Continue Singulair, fluticasone nasal spray as you are taking them. Continue omeprazole and Pepcid as you are using it.  Remember to keep the head of your bed on incline. We will perform a split-night sleep study and start CPAP if it is indicated. Agree with talking with Dr. Excell Seltzer regarding the pros and cons of tightening your lap band to help facilitate weight loss. You will benefit from other weight loss strategies including slowly and steadily increasing your exercise and modifying her diet. Please continue your Imuran and prednisone as per your chronic  dosing. Follow with Dr. Lamonte Sakai in 6 to 8 weeks to review your sleep study results together

## 2018-10-20 ENCOUNTER — Telehealth: Payer: Self-pay | Admitting: Internal Medicine

## 2018-10-20 ENCOUNTER — Other Ambulatory Visit: Payer: Self-pay | Admitting: Cardiology

## 2018-10-20 NOTE — Telephone Encounter (Signed)
Rec'd from [place] Latino forwarded 10 pages to Dr. Scarlette Calico

## 2018-10-21 ENCOUNTER — Ambulatory Visit (INDEPENDENT_AMBULATORY_CARE_PROVIDER_SITE_OTHER): Payer: Medicare Other | Admitting: Pharmacist

## 2018-10-21 ENCOUNTER — Encounter: Payer: Self-pay | Admitting: Primary Care

## 2018-10-21 ENCOUNTER — Ambulatory Visit (INDEPENDENT_AMBULATORY_CARE_PROVIDER_SITE_OTHER): Payer: Medicare Other | Admitting: Primary Care

## 2018-10-21 VITALS — BP 112/80 | HR 79 | Temp 97.9°F | Ht 61.0 in | Wt 251.8 lb

## 2018-10-21 DIAGNOSIS — R52 Pain, unspecified: Secondary | ICD-10-CM | POA: Diagnosis not present

## 2018-10-21 DIAGNOSIS — I480 Paroxysmal atrial fibrillation: Secondary | ICD-10-CM

## 2018-10-21 DIAGNOSIS — J020 Streptococcal pharyngitis: Secondary | ICD-10-CM | POA: Diagnosis not present

## 2018-10-21 DIAGNOSIS — R6889 Other general symptoms and signs: Secondary | ICD-10-CM | POA: Diagnosis not present

## 2018-10-21 DIAGNOSIS — R05 Cough: Secondary | ICD-10-CM | POA: Diagnosis not present

## 2018-10-21 DIAGNOSIS — R059 Cough, unspecified: Secondary | ICD-10-CM

## 2018-10-21 DIAGNOSIS — Z7901 Long term (current) use of anticoagulants: Secondary | ICD-10-CM | POA: Diagnosis not present

## 2018-10-21 LAB — BASIC METABOLIC PANEL
BUN: 18 mg/dL (ref 6–23)
CO2: 24 mEq/L (ref 19–32)
Calcium: 9.8 mg/dL (ref 8.4–10.5)
Chloride: 100 mEq/L (ref 96–112)
Creatinine, Ser: 0.89 mg/dL (ref 0.40–1.20)
GFR: 64.41 mL/min (ref >60.00)
Glucose, Bld: 96 mg/dL (ref 70–99)
Potassium: 4.4 mEq/L (ref 3.5–5.1)
Sodium: 134 mEq/L — ABNORMAL LOW (ref 135–145)

## 2018-10-21 LAB — STREP COMPLETE PANEL
STREP DYSGALACTIAE: POSITIVE — AB
Strep Pyogenes: NEGATIVE

## 2018-10-21 LAB — CBC WITH DIFFERENTIAL/PLATELET
BASOS PCT: 0.6 % (ref 0.0–3.0)
Basophils Absolute: 0 10*3/uL (ref 0.0–0.1)
EOS PCT: 1 % (ref 0.0–5.0)
Eosinophils Absolute: 0.1 10*3/uL (ref 0.0–0.7)
HCT: 36.2 % (ref 36.0–46.0)
HEMOGLOBIN: 12.1 g/dL (ref 12.0–15.0)
Lymphocytes Relative: 20.1 % (ref 12.0–46.0)
Lymphs Abs: 1.6 10*3/uL (ref 0.7–4.0)
MCHC: 33.4 g/dL (ref 30.0–36.0)
MCV: 93.7 fl (ref 78.0–100.0)
Monocytes Absolute: 0.6 10*3/uL (ref 0.1–1.0)
Monocytes Relative: 7.6 % (ref 3.0–12.0)
NEUTROS PCT: 70.7 % (ref 43.0–77.0)
Neutro Abs: 5.7 10*3/uL (ref 1.4–7.7)
Platelets: 261 10*3/uL (ref 150.0–400.0)
RBC: 3.86 Mil/uL — AB (ref 3.87–5.11)
RDW: 17.4 % — ABNORMAL HIGH (ref 11.5–15.5)
WBC: 8.1 10*3/uL (ref 4.0–10.5)

## 2018-10-21 LAB — POCT INR: INR: 5.5 — AB (ref 2.0–3.0)

## 2018-10-21 LAB — POCT INFLUENZA A/B
Influenza A, POC: NEGATIVE
Influenza B, POC: NEGATIVE

## 2018-10-21 LAB — MAGNESIUM: Magnesium: 1.8 mg/dL (ref 1.5–2.5)

## 2018-10-21 MED ORDER — PENICILLIN V POTASSIUM 500 MG PO TABS: ORAL_TABLET | ORAL | 0 refills | Status: DC

## 2018-10-21 NOTE — Patient Instructions (Addendum)
Description   No warfarin today or tomorrow then only 1/2 tablet on Sunday the change dose to 1 tablet daily.  Recheck INR in 7 days. [facility] [phone] call with procedures or new medications.

## 2018-10-21 NOTE — Progress Notes (Signed)
@Patient  ID: Katherine [name], female    DOB: November 28, 1955, 63 y.o.   MRN: [id number]  Chief Complaint  Patient presents with  . Acute Visit    pt having body aches at night starting two weeks ago, sweats, cough w/ brown mucus, chest pain/tightness since December 2019    Referring provider: [name], MD  HPI: 63 year old female, former smoker quit 2004 (34 pack year hx). PMH extrinic asthma, chronic allergic rhinitis, sleep apnea. Patient of Dr. Lamonte Sakai, last seen on 10/10/18. Maintained on Symbicort and singulair. CXR on 2/12 showed findings suggestive of bronchitis. No infiltrate or effusion.   10/21/2018 Patient presents today with flu like symptoms. Reports productive cough with brown mucus and chest tightness since December. Associated sore throat and body aches. She does have fibromyalgia. Needs to take oxycodone in am. Recently completed Cefdinir course. Using Symbicort as directed. Using albuterol nebulizer once a day. Not taking anything over the counter. Afebrile.   Allergies  Allergen Reactions  . Tetracycline Hives  . Hydromorphone Hcl Nausea And Vomiting  . Niacin Other (See Comments)    Mouth blisters  . Niaspan [Niacin Er] Other (See Comments)    Mouth blisters  . Sulfa Antibiotics Nausea Only and Other (See Comments)    "Tears up stomach"  . Sulfonamide Derivatives Other (See Comments)    Reaction: per patient "tears her stomach up"  . Codeine Nausea And Vomiting  . Erythromycin Nausea And Vomiting  . Hydromorphone Hcl Nausea And Vomiting  . Morphine And Related Nausea And Vomiting  . Nalbuphine Nausea And Vomiting  . Sulfasalazine Nausea Only and Other (See Comments)    per patient "tears her stomach up", "Tears up stomach"  . Tape Rash and Other (See Comments)    No "plastic" tape," please    Immunization History  Administered Date(s) Administered  . Influenza Split 05/11/2012, 05/17/2016, 05/17/2017  . Influenza Whole 06/17/2009, 06/17/2010,  04/18/2011  . Influenza,inj,Quad PF,6+ Mos 08/24/2013, 05/20/2015, 04/21/2018  . Pneumococcal Polysaccharide-23 11/30/2017  . Tdap 11/30/2017  . Zoster Recombinat (Shingrix) 07/04/2018    Past Medical History:  Diagnosis Date  . Anemia   . Anxiety   . Bilateral carotid artery stenosis    Carotid duplex 02/2093: 7-09% LICA, 62-83% RICA, >66% RECA, f/u 1 yr suggested  . CAD (coronary artery disease) of bypass graft 5/01; 3/'02, 8/'03, 10/'04; 1/15   PCI x 5 to SVG-D1   . CAD in native artery 07/1993    3 Vessel Disease (LAD-D1 & RCA) -- CABG  . CAD S/P percutaneous coronary angioplasty    PCI to SVG-D1 insertion/native D1 x 4 = '01 -(S660 BMS 2.5 x 9 - insertion into D1; '02 - distal overlap ACS Pixel 2.5 x 8  BMS; '03 distal/native ISR/Thrombosis - Pixel 2.5 x 13; '04 - ISR-  Taxus 2.5 x 20 (covered all);; 1/15 - mid SVG-D1 (50% distal ISR) - Promus P 2.75 x 20 -- 2.8 mm  . COPD mixed type ([place])    Followed by Dr. Lamonte Sakai "pulmonologist said no COPD"  . Depression   . Depression with anxiety   . Diabetes mellitus type 2 in obese ([place])   . Diarrhea    started after cholecystectomy and mass removed from intestine  . Dyslipidemia, goal LDL below 70    08/2012: TC 137, TG 200, HDL 32!, LDL 45; on statin (followed by Dr.Deterding)  . ESRD (end stage renal disease) ([place]) 1991   s/p Cadaveric Renal Transplant ([place] -  Dr. Jimmy Footman)   . Family history of adverse reaction to anesthesia    mom's bp dropped during/after anesthesia  . Fibromyalgia   . GERD (gastroesophageal reflux disease)   . Glomerulonephritis, chronic, rapidly progressive 11  . 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 07/1993   Rescue PTCA of RCA -- referred for CABG.  . H/O: GI bleed   . Headache    migraines in the past  . History of kidney stones   . Hypertension associated with diabetes ([place])   . MRSA (methicillin resistant staph aureus) culture positive   . Obesity    s/p Lap Band  04/2004- Port Replacement 10/09 & 2/10 for infection; Dr. Excell Seltzer.  . OSA (obstructive sleep apnea)    no longer on CPAP or home O2, states she doesn't need now after lap band  . PAD (peripheral artery disease) ([place]) 08/2013   LEA Dopplers to be read by Dr. Fletcher Anon  . PAF (paroxysmal atrial fibrillation) ([place]) 06/2014   Noted on CardioNet Monitor  - rate ~112; no recurrent symptoms. Nonionic regulation because of frequent GI procedures. Patient prefers Plavix  . Pneumonia   . Recurrent boils    Bilateral Groin  . Rheumatoid arthritis ([place])    Per Patient Report; associated with OA  . 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 Servando Snare: LIMA-LAD, SVG-bifurcatingD1, SVG-rPDA  . S/p cadaver renal transplant [place]  . Stroke [facility]) 2012   "right eye stroke- half blind now"  . Unstable angina ([place]) 5/01; 3/'02, 8/'03, 10/'04; 1/15   x 5 occurences since Inf-Post STEMI in 1994    Tobacco History: Social History   Tobacco Use  Smoking Status Former Smoker  . Packs/day: 1.00  . Years: 30.00  . Pack years: 30.00  . Types: Cigarettes  . Last attempt to quit: 08/17/2002  . Years since quitting: 16.1  Smokeless Tobacco Never Used   Counseling given: Not Answered   Outpatient Medications Prior to Visit  Medication Sig Dispense Refill  . acetaminophen (TYLENOL) 500 MG tablet Take 1 tablet (500 mg total) by mouth every 6 (six) hours as needed for moderate pain or headache. (Patient taking differently: Take 1,000 mg by mouth every 6 (six) hours as needed for moderate pain or headache. ) 30 tablet 0  . albuterol (PROVENTIL) (2.5 MG/3ML) 0.083% nebulizer solution Take 3 mLs (2.5 mg total) by nebulization every 6 (six) hours as needed for wheezing or shortness of breath. 150 mL 2  . albuterol (VENTOLIN HFA) 108 (90 Base) MCG/ACT inhaler Inhale 2 puffs into the lungs every 6 (six) hours as needed for wheezing or shortness of breath. 1 Inhaler 2  . azaTHIOprine (IMURAN) 50 MG tablet Take 125 mg by mouth every  evening.     . budesonide-formoterol (SYMBICORT) 160-4.5 MCG/ACT inhaler Inhale 2 puffs into the lungs 2 (two) times daily. 1 Inhaler 0  . calcitRIOL (ROCALTROL) 0.25 MCG capsule Take 0.25 mcg by mouth every 3 (three) days.     . carbamazepine (TEGRETOL) 200 MG tablet TAKE 1 TABLET BY MOUTH TWICE A DAY 180 tablet 0  . cefdinir (OMNICEF) 300 MG capsule Take 1 capsule (300 mg total) by mouth 2 (two) times daily. 14 capsule 0  . clopidogrel (PLAVIX) 75 MG tablet TAKE 1 TABLET BY MOUTH EVERY DAY 30 tablet 0  . clotrimazole (MYCELEX) 10 MG troche Take 1 tablet (10 mg total) by mouth 5 (five) times daily. 70 tablet 0  . cyclobenzaprine (FLEXERIL) 10  MG tablet TAKE 1 TABLET BY MOUTH 3 TIMES DAILY AS NEEDED FOR MUSCLE SPASMS (Patient taking differently: Take 10 mg by mouth 2 (two) times daily. ) 90 tablet 2  . diltiazem (CARDIZEM CD) 120 MG 24 hr capsule TAKE 1 CAPSULE (120 MG TOTAL) BY MOUTH DAILY. NEED OV. (Patient taking differently: Take 120 mg by mouth daily. ) 30 capsule 0  . dofetilide (TIKOSYN) 250 MCG capsule Take 1 capsule (250 mcg total) by mouth 2 (two) times daily. 60 capsule 6  . DULoxetine (CYMBALTA) 30 MG capsule TAKE 2 CAPSULES (60 MG TOTAL) BY MOUTH EVERY MORNING. 180 capsule 2  . famotidine (PEPCID) 20 MG tablet Take 20 mg by mouth daily.    . ferrous sulfate 325 (65 FE) MG tablet Take 1 tablet (325 mg total) by mouth 3 (three) times daily with meals. 90 tablet 0  . fluticasone (FLONASE) 50 MCG/ACT nasal spray Place 2 sprays into both nostrils 2 (two) times daily. 48 g 1  . glimepiride (AMARYL) 4 MG tablet Take 4 mg by mouth every morning.  6  . guaiFENesin (MUCINEX) 600 MG 12 hr tablet Take 1 tablet (600 mg total) by mouth 2 (two) times daily. 30 tablet 0  . Insulin Glargine (BASAGLAR KWIKPEN) 100 UNIT/ML SOPN Inject 12 Units into the skin daily.    [name] Kitchen LORazepam (ATIVAN) 1 MG tablet Take 1 mg by mouth every morning.     . metFORMIN (GLUCOPHAGE) 500 MG tablet Take 500 mg by mouth 2 (two)  times daily.  6  . metoprolol tartrate (LOPRESSOR) 25 MG tablet TAKE 1 TABLET BY MOUTH TWICE A DAY 180 tablet 1  . montelukast (SINGULAIR) 10 MG tablet TAKE 1 TABLET BY MOUTH EVERY DAY 30 tablet 2  . nitroGLYCERIN (NITROSTAT) 0.4 MG SL tablet Place 1 tablet (0.4 mg total) under the tongue every 5 (five) minutes as needed for chest pain. 25 tablet 6  . NOVOLOG FLEXPEN 100 UNIT/ML FlexPen Inject 4 Units into the skin 2 (two) times daily as needed for high blood sugar.   6  . omeprazole (PRILOSEC) 40 MG capsule Take 40 mg by mouth every morning.     [name] Kitchen oxyCODONE (OXY IR/ROXICODONE) 5 MG immediate release tablet Take 1 tablet (5 mg total) by mouth every 8 (eight) hours as needed for severe pain. 60 tablet 0  . predniSONE (DELTASONE) 10 MG tablet Take 4 tabs for 2 days, then 3 tabs for 2 days, then 2 tabs for 2 days, then 1 tab for 2 days, then stop 20 tablet 0  . predniSONE (DELTASONE) 5 MG tablet Take 5 mg by mouth daily with breakfast.    . promethazine (PHENERGAN) 25 MG tablet Take 1 tablet (25 mg total) by mouth every 6 (six) hours as needed for nausea or vomiting (or sleep). 15 tablet 0  . rosuvastatin (CRESTOR) 20 MG tablet TAKE 1 TABLET BY MOUTH EVERY DAY 90 tablet 3  . sertraline (ZOLOFT) 100 MG tablet Take 100 mg by mouth every morning.    . warfarin (COUMADIN) 10 MG tablet TAKE 1 TABLET DAILY OR AS DIRECTED BY COUMADIN CLINIC 120 tablet 1   No facility-administered medications prior to visit.     Review of Systems  Review of Systems  Constitutional:       Body aches   HENT: Positive for sore throat.   Respiratory: Positive for cough.     Physical Exam  BP 112/80 (BP Location: Right Wrist, Cuff Size: Normal)   Pulse 79  Temp 97.9 F (36.6 C) (Axillary)   Ht 5\' 1"  (1.549 m)   Wt 251 lb 12.8 oz (114.2 kg)   SpO2 95%   BMI 47.58 kg/m  Physical Exam Constitutional:      General: She is not in acute distress.    Appearance: Normal appearance. She is well-developed.  HENT:      Head: Normocephalic and atraumatic.     Right Ear: Tympanic membrane normal.     Left Ear: Tympanic membrane normal.     Mouth/Throat:     Mouth: Mucous membranes are moist.     Pharynx: Oropharynx is clear.  Eyes:     Pupils: Pupils are equal, round, and reactive to light.  Neck:     Musculoskeletal: Normal range of motion and neck supple.  Cardiovascular:     Rate and Rhythm: Normal rate and regular rhythm.     Heart sounds: Normal heart sounds. No murmur.  Pulmonary:     Effort: Pulmonary effort is normal.     Breath sounds: Normal breath sounds.  Abdominal:     General: Bowel sounds are normal.     Palpations: Abdomen is soft.     Tenderness: There is no abdominal tenderness.  Skin:    General: Skin is warm and dry.     Findings: No erythema or rash.  Neurological:     General: No focal deficit present.     Mental Status: She is alert and oriented to person, place, and time. Mental status is at baseline.  Psychiatric:        Mood and Affect: Mood normal.        Behavior: Behavior normal.        Thought Content: Thought content normal.        Judgment: Judgment normal.      Lab Results:  CBC    Component Value Date/Time   WBC 8.1 10/21/2018 1449   RBC 3.86 (L) 10/21/2018 1449   HGB 12.1 10/21/2018 1449   HCT 36.2 10/21/2018 1449   PLT 261.0 10/21/2018 1449   MCV 93.7 10/21/2018 1449   MCH 31.4 04/25/2018 0543   MCHC 33.4 10/21/2018 1449   RDW 17.4 (H) 10/21/2018 1449   LYMPHSABS 1.6 10/21/2018 1449   MONOABS 0.6 10/21/2018 1449   EOSABS 0.1 10/21/2018 1449   BASOSABS 0.0 10/21/2018 1449    BMET    Component Value Date/Time   NA 134 (L) 10/21/2018 1449   NA 134 (A) 01/04/2018   K 4.4 10/21/2018 1449   CL 100 10/21/2018 1449   CO2 24 10/21/2018 1449   GLUCOSE 96 10/21/2018 1449   BUN 18 10/21/2018 1449   BUN 17 01/04/2018   CREATININE 0.89 10/21/2018 1449   CREATININE 0.90 06/26/2014 1108   CALCIUM 9.8 10/21/2018 1449   GFRNONAA >60 05/06/2018  1127   GFRAA >60 05/06/2018 1127    BNP No results found for: BNP  ProBNP    Component Value Date/Time   PROBNP 139.0 (H) 02/07/2016 1224    Imaging: Dg Chest 2 View  Result Date: 09/28/2018 CLINICAL DATA:  Productive cough, wheezing and shortness of breath. EXAM: CHEST - 2 VIEW COMPARISON:  07/04/2018 FINDINGS: Stable borderline cardiac enlargement and mild tortuosity of the thoracic aorta. 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 Peribronchial thickening and increased interstitial markings along with streaky areas of subsegmental atelectasis suggesting bronchitis. No focal infiltrates or pleural effusions. The bony thorax is intact. IMPRESSION: Findings suggest bronchitis.  No infiltrates or effusions. Electronically  Signed   By: Marijo Sanes M.D.   On: 09/28/2018 12:52     Assessment & Plan:   Flu-like symptoms - Rapid flu negative  - Checking throat and sputum culture   Strep throat - Throat culture 10/21/2018 positive for Strep dysgalactiae - Sending Penicillin V 500mg  BID x 10 days - If no improvement recommend patient follow up with ENT (already established)   Martyn Ehrich, NP 10/21/2018

## 2018-10-21 NOTE — Progress Notes (Deleted)
Cardiology Office Note   Date:  10/21/2018   ID:  Katherine [name], DOB 15-Nov-1955, MRN [id number]  PCP:  [name], MD  Cardiologist: Dr. Ellyn Hack  EP: Dr.Allred  No chief complaint on file.    History of Present Illness: Katherine [name] is a 63 y.o. female who presents for onngoing assessment and management of persistent atrial fib/flutter,  on coumadin instead of DOAC due to interaction with carbamazepine, CHADS VASC Score of 5. She was admitted in September 2019 for PAF. She converted on Tikosyn and diltiazem  Seen by Roderic Palau NP, on 05/06/2018, and found to be stable.   She also underwent cardiac cath revealing severe native CAD with occluded RCA and proximal LAD, severe but stable OM 1 disease. Patent SVG with stent in place, patent SVG to dRCA. She was also found to have sever focal lesion in the right common femoral artery. She was continued on medical management.   She also has a history of renal transplant and is followed by nephrology, chronic pain, fibromyalgia,COPD,  and depression with anxiety. Seen by PCP for flu-like symptoms on 10/21/2018.   Past Medical History:  Diagnosis Date  . Anemia   . Anxiety   . Bilateral carotid artery stenosis    Carotid duplex 03/6766: 2-09% LICA, 47-09% RICA, >62% RECA, f/u 1 yr suggested  . CAD (coronary artery disease) of bypass graft 5/01; 3/'02, 8/'03, 10/'04; 1/15   PCI x 5 to SVG-D1   . CAD in native artery 07/1993    3 Vessel Disease (LAD-D1 & RCA) -- CABG  . CAD S/P percutaneous coronary angioplasty    PCI to SVG-D1 insertion/native D1 x 4 = '01 -(S660 BMS 2.5 x 9 - insertion into D1; '02 - distal overlap ACS Pixel 2.5 x 8  BMS; '03 distal/native ISR/Thrombosis - Pixel 2.5 x 13; '04 - ISR-  Taxus 2.5 x 20 (covered all);; 1/15 - mid SVG-D1 (50% distal ISR) - Promus P 2.75 x 20 -- 2.8 mm  . COPD mixed type ([place])    Followed by Dr. Lamonte Sakai "pulmonologist said no COPD"  . Depression   . Depression with anxiety   . Diabetes  mellitus type 2 in obese ([place])   . Diarrhea    started after cholecystectomy and mass removed from intestine  . Dyslipidemia, goal LDL below 70    08/2012: TC 137, TG 200, HDL 32!, LDL 45; on statin (followed by Dr.Deterding)  . ESRD (end stage renal disease) ([place]) 1991   s/p Cadaveric Renal Transplant [facility] - Dr. Jimmy Footman)   . Family history of adverse reaction to anesthesia    mom's bp dropped during/after anesthesia  . Fibromyalgia   . GERD (gastroesophageal reflux disease)   . Glomerulonephritis, chronic, rapidly progressive 47  . 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 07/1993   Rescue PTCA of RCA -- referred for CABG.  . H/O: GI bleed   . Headache    migraines in the past  . History of kidney stones   . Hypertension associated with diabetes ([place])   . MRSA (methicillin resistant staph aureus) culture positive   . Obesity    s/p Lap Band 04/2004- Port Replacement 10/09 & 2/10 for infection; Dr. Excell Seltzer.  . OSA (obstructive sleep apnea)    no longer on CPAP or home O2, states she doesn't need now after lap band  . PAD (peripheral artery disease) ([place]) 08/2013   LEA Dopplers to be read by Dr. Fletcher Anon  .  PAF (paroxysmal atrial fibrillation) ([place]) 06/2014   Noted on CardioNet Monitor  - rate ~112; no recurrent symptoms. Nonionic regulation because of frequent GI procedures. Patient prefers Plavix  . Pneumonia   . Recurrent boils    Bilateral Groin  . Rheumatoid arthritis ([place])    Per Patient Report; associated with OA  . 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 Servando Snare: LIMA-LAD, SVG-bifurcatingD1, SVG-rPDA  . S/p cadaver renal transplant [place]  . Stroke [facility]) 2012   "right eye stroke- half blind now"  . Unstable angina ([place]) 5/01; 3/'02, 8/'03, 10/'04; 1/15   x 5 occurences since Inf-Post STEMI in 1994    Past Surgical History:  Procedure Laterality Date  . ABDOMINAL AORTOGRAM N/A 04/21/2018   Procedure: ABDOMINAL AORTOGRAM;  Surgeon: [name], MD;  Location: [place] CV LAB;  Service: Cardiovascular;  Laterality: N/A;  . CARDIAC CATHETERIZATION  5/'01, 3/'02, 8/'03, 10/'04; 1/'15   08/22/2013: LAD & RCA 100%; LIMA-LAD & SVG-rPDA patent; Cx-- OM1 60%, OM2 ostial ~50%; SVG-D1 - 80% mid, 50% distal ISR --PCI  . CATHETER REMOVAL    . CHOLECYSTECTOMY N/A 10/29/2014   Procedure: LAPAROSCOPIC CHOLECYSTECTOMY WITH INTRAOPERATIVE CHOLANGIOGRAM;  Surgeon: Excell Seltzer, MD;  Location: [facility];  Service: General;  Laterality: N/A;  . CORONARY ANGIOPLASTY  1994   x5  . CORONARY ARTERY BYPASS GRAFT  1995   LIMA-LAD, SVG-RPDA, SVG-D1  . ESOPHAGOGASTRODUODENOSCOPY N/A 10/15/2016   Procedure: ESOPHAGOGASTRODUODENOSCOPY (EGD);  Surgeon: Wilford Corner, MD;  Location: [facility] ENDOSCOPY;  Service: Endoscopy;  Laterality: N/A;  . I&D EXTREMITY Right 01/29/2018   Procedure: IRRIGATION AND DEBRIDEMENT THUMB;  Surgeon: Dayna Barker, MD;  Location: [place];  Service: Plastics;  Laterality: Right;  . INCISE AND DRAIN ABCESS    . KIDNEY TRANSPLANT  1991  . KNEE ARTHROSCOPY WITH LATERAL MENISECTOMY Left 12/03/2017   Procedure: LEFT KNEE ARTHROSCOPY WITH LATERAL MENISECTOMY;  Surgeon: Earlie Server, MD;  Location: [place];  Service: Orthopedics;  Laterality: Left;  . LAPAROSCOPIC GASTRIC BANDING  04/2004; 10/'09, 2/'10   Port Replacement x 2  . LEFT HEART CATH AND CORS/GRAFTS ANGIOGRAPHY N/A 04/21/2018   Procedure: LEFT HEART CATH AND CORS/GRAFTS ANGIOGRAPHY;  Surgeon: [name], MD;  Location: [place] CV [facility];  Service: Cardiovascular;  Laterality: N/A;  . LEFT HEART CATHETERIZATION WITH CORONARY/GRAFT ANGIOGRAM N/A 08/23/2013   Procedure: LEFT HEART CATHETERIZATION WITH Beatrix Fetters;  Surgeon: Wellington Hampshire, MD;  Location: [place] CATH LAB;  Service: Cardiovascular;  Laterality: N/A;  . Lower Extremity Arterial Dopplers  08/2013   ABI: R 0.96, L 1.04  . MULTIPLE TOOTH EXTRACTIONS  age 51  . NM MYOVIEW LTD  03/2016   EF 62%. LOW RISK. C/W  prior MI - no Ischemia. Apical hypokinesis.  [name] Kitchen PERCUTANEOUS CORONARY STENT INTERVENTION (PCI-S)  5/'01, 3/'02, 8/'03, 10/'04;   '01 - S660 BMS 2.5 x 9 - dSVG-D1 into D1; '02- post-stent stenosis - 2.5 x 8 Pixel BMS; '8\03: ISR/Thrombosis into native D1 - AngioJet, 2.5 x 13 Pixel; '04 - ISR 95% - covered stented area with Taxus DES 2.5 mm x 20 (2.88)  . PERCUTANEOUS CORONARY STENT INTERVENTION (PCI-S)  08/23/2013   Procedure: PERCUTANEOUS CORONARY STENT INTERVENTION (PCI-S);  Surgeon: Wellington Hampshire, MD;  Location: [facility] CATH LAB;  Service: Cardiovascular;;mid SVG-D1 80%; distal stent ~50% ISR; Promus Prermier DES 2.75 mm xc 20 mm (2.8 mm)  . PORT-A-CATH REMOVAL     kidney  . TRANSTHORACIC ECHOCARDIOGRAM  02/2016  EF 55-60%. Septal dyssynergy from CABG GR 2 DD. Aortic valve is trileaflet the functional bicuspid with sclerosis but not yet notable for stenosis stenosis.; no Significant change  . TUBAL LIGATION    . wrist fistula repair Left    dialysis for one year     Current Outpatient Medications  Medication Sig Dispense Refill  . acetaminophen (TYLENOL) 500 MG tablet Take 1 tablet (500 mg total) by mouth every 6 (six) hours as needed for moderate pain or headache. (Patient taking differently: Take 1,000 mg by mouth every 6 (six) hours as needed for moderate pain or headache. ) 30 tablet 0  . albuterol (PROVENTIL) (2.5 MG/3ML) 0.083% nebulizer solution Take 3 mLs (2.5 mg total) by nebulization every 6 (six) hours as needed for wheezing or shortness of breath. 150 mL 2  . albuterol (VENTOLIN HFA) 108 (90 Base) MCG/ACT inhaler Inhale 2 puffs into the lungs every 6 (six) hours as needed for wheezing or shortness of breath. 1 Inhaler 2  . azaTHIOprine (IMURAN) 50 MG tablet Take 125 mg by mouth every evening.     . budesonide-formoterol (SYMBICORT) 160-4.5 MCG/ACT inhaler Inhale 2 puffs into the lungs 2 (two) times daily. 1 Inhaler 0  . calcitRIOL (ROCALTROL) 0.25 MCG capsule Take 0.25 mcg by mouth  every 3 (three) days.     . carbamazepine (TEGRETOL) 200 MG tablet TAKE 1 TABLET BY MOUTH TWICE A DAY 180 tablet 0  . cefdinir (OMNICEF) 300 MG capsule Take 1 capsule (300 mg total) by mouth 2 (two) times daily. 14 capsule 0  . clopidogrel (PLAVIX) 75 MG tablet TAKE 1 TABLET BY MOUTH EVERY DAY 30 tablet 0  . clotrimazole (MYCELEX) 10 MG troche Take 1 tablet (10 mg total) by mouth 5 (five) times daily. 70 tablet 0  . cyclobenzaprine (FLEXERIL) 10 MG tablet TAKE 1 TABLET BY MOUTH 3 TIMES DAILY AS NEEDED FOR MUSCLE SPASMS (Patient taking differently: Take 10 mg by mouth 2 (two) times daily. ) 90 tablet 2  . diltiazem (CARDIZEM CD) 120 MG 24 hr capsule TAKE 1 CAPSULE (120 MG TOTAL) BY MOUTH DAILY. NEED OV. (Patient taking differently: Take 120 mg by mouth daily. ) 30 capsule 0  . dofetilide (TIKOSYN) 250 MCG capsule Take 1 capsule (250 mcg total) by mouth 2 (two) times daily. 60 capsule 6  . DULoxetine (CYMBALTA) 30 MG capsule TAKE 2 CAPSULES (60 MG TOTAL) BY MOUTH EVERY MORNING. 180 capsule 2  . famotidine (PEPCID) 20 MG tablet Take 20 mg by mouth daily.    . ferrous sulfate 325 (65 FE) MG tablet Take 1 tablet (325 mg total) by mouth 3 (three) times daily with meals. 90 tablet 0  . fluticasone (FLONASE) 50 MCG/ACT nasal spray Place 2 sprays into both nostrils 2 (two) times daily. 48 g 1  . glimepiride (AMARYL) 4 MG tablet Take 4 mg by mouth every morning.  6  . guaiFENesin (MUCINEX) 600 MG 12 hr tablet Take 1 tablet (600 mg total) by mouth 2 (two) times daily. 30 tablet 0  . Insulin Glargine (BASAGLAR KWIKPEN) 100 UNIT/ML SOPN Inject 12 Units into the skin daily.    [name] Kitchen LORazepam (ATIVAN) 1 MG tablet Take 1 mg by mouth every morning.     . metFORMIN (GLUCOPHAGE) 500 MG tablet Take 500 mg by mouth 2 (two) times daily.  6  . metoprolol tartrate (LOPRESSOR) 25 MG tablet TAKE 1 TABLET BY MOUTH TWICE A DAY 180 tablet 1  . montelukast (SINGULAIR) 10 MG tablet  TAKE 1 TABLET BY MOUTH EVERY DAY 30 tablet 2  .  nitroGLYCERIN (NITROSTAT) 0.4 MG SL tablet Place 1 tablet (0.4 mg total) under the tongue every 5 (five) minutes as needed for chest pain. 25 tablet 6  . NOVOLOG FLEXPEN 100 UNIT/ML FlexPen Inject 4 Units into the skin 2 (two) times daily as needed for high blood sugar.   6  . omeprazole (PRILOSEC) 40 MG capsule Take 40 mg by mouth every morning.     [name] Kitchen oxyCODONE (OXY IR/ROXICODONE) 5 MG immediate release tablet Take 1 tablet (5 mg total) by mouth every 8 (eight) hours as needed for severe pain. 60 tablet 0  . predniSONE (DELTASONE) 10 MG tablet Take 4 tabs for 2 days, then 3 tabs for 2 days, then 2 tabs for 2 days, then 1 tab for 2 days, then stop 20 tablet 0  . predniSONE (DELTASONE) 5 MG tablet Take 5 mg by mouth daily with breakfast.    . promethazine (PHENERGAN) 25 MG tablet Take 1 tablet (25 mg total) by mouth every 6 (six) hours as needed for nausea or vomiting (or sleep). 15 tablet 0  . rosuvastatin (CRESTOR) 20 MG tablet TAKE 1 TABLET BY MOUTH EVERY DAY 90 tablet 3  . sertraline (ZOLOFT) 100 MG tablet Take 100 mg by mouth every morning.    . warfarin (COUMADIN) 10 MG tablet TAKE 1 TABLET DAILY OR AS DIRECTED BY COUMADIN CLINIC 120 tablet 1   No current facility-administered medications for this visit.     Allergies:   Tetracycline; Hydromorphone hcl; Niacin; Niaspan [niacin er]; Sulfa antibiotics; Sulfonamide derivatives; Codeine; Erythromycin; Hydromorphone hcl; Morphine and related; Nalbuphine; Sulfasalazine; and Tape    Social History:  The patient  reports that she quit smoking about 16 years ago. Her smoking use included cigarettes. She has a 30.00 pack-year smoking history. She has never used smokeless tobacco. She reports that she does not drink alcohol or use drugs.   Family History:  The patient's family history includes Arrhythmia in her brother and paternal aunt; Cancer in her father and mother; Heart disease in her father.    ROS: All other systems are reviewed and negative.  Unless otherwise mentioned in H&P    PHYSICAL EXAM: VS:  There were no vitals taken for this visit. , BMI There is no height or weight on file to calculate BMI. GEN: Well nourished, well developed, in no acute distress HEENT: normal Neck: no JVD, carotid bruits, or masses Cardiac: ***RRR; no murmurs, rubs, or gallops,no edema  Respiratory:  Clear to auscultation bilaterally, normal work of breathing GI: soft, nontender, nondistended, + BS MS: no deformity or atrophy Skin: warm and dry, no rash Neuro:  Strength and sensation are intact Psych: euthymic mood, full affect   EKG:  EKG {ACTION; IS/IS [id number]} ordered today. The ekg ordered today demonstrates ***   Recent Labs: 02/03/2018: ALT 17 04/20/2018: TSH 3.118 05/06/2018: BUN 13; Creatinine, Ser 0.98; Magnesium 1.9; Potassium 5.3; Sodium 136 07/04/2018: Hemoglobin 11.9; Platelets 237.0    Lipid Panel    Component Value Date/Time   CHOL 111 11/30/2017 1605   TRIG 129.0 11/30/2017 1605   HDL 53.90 11/30/2017 1605   CHOLHDL 2 11/30/2017 1605   VLDL 25.8 11/30/2017 1605   LDLCALC 32 11/30/2017 1605      Wt Readings from Last 3 Encounters:  10/10/18 252 lb (114.3 kg)  09/28/18 253 lb (114.8 kg)  09/12/18 245 lb (111.1 kg)      Other studies Reviewed:  Cardiac Cath 04/21/2018    LV end diastolic pressure is normal.  _________________________________________________  Colon Flattery LAD to Prox LAD lesion is 50% stenosed. Prox LAD-1 lesion is 100% stenosed prior to D1. Prox LAD-2 lesion is 100% stenosed after D1.  SVG-D1 graft was visualized by angiography and is large. Dist Graft to Insertion STENT is 20% stenosed.  The Native Cx is widely patent with a small 1st Marg & 2 Small distal LPL branches.  Ost 1st Mrg lesion is 90% stenosed. Small Caliber Vessel - not good PCI/PTCA target  Ost RCA to Dist RCA lesion is 100% stenosed. - Unable to engage. (known occlusion)  SVG-dRCA graft was visualized by angiography and is  large. The graft exhibits minimal luminal irregularities.  LIMA graft was not injected. 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   Severe native coronary disease with occluded RCA and proximal LAD.  Severe, but stable ostial OM1 disease Patent SVG-D1 with stent in place. Patent SVG-dRCA Due to access issues, unable to visualize LIMA graft. Severe focal calcified lesion in the right common femoral artery: Unable to obtain femoral arterial access despite using ultrasound guidance -unable to advance wire.  No obvious lesions found for PCI.  Unfortunately still unable to evaluate the LIMA graft, however this is unlikely to have an issue as it was widely patent and previous cath. May need to consider noninvasive evaluation versus left femoral access if necessary to visualize LIMA.   Echocardiogram 10/27/2017 Study Conclusions  - Left ventricle: The cavity size was normal. Wall thickness was   increased in a pattern of moderate LVH. Systolic function was   normal. The estimated ejection fraction was in the range of 55%   to 60%. Wall motion was normal; there were no regional wall   motion abnormalities. Features are consistent with a pseudonormal   left ventricular filling pattern, with concomitant abnormal   relaxation and increased filling pressure (grade 2 diastolic   dysfunction). - Aortic valve: Mildly calcified annulus. Moderately thickened,   moderately calcified leaflets. There was mild stenosis. Valve   area (VTI): 1.93 cm^2. Valve area (Vmax): 1.9 cm^2. Valve area   (Vmean): 1.79 cm^2.   ASSESSMENT AND PLAN:  1.  ***   Current medicines are reviewed at length with the patient today.    Labs/  tests ordered today include: *** Phill Myron. [place] Pugh, ANP, AACC   10/21/2018 12:37 PM    [place] [address] Office [phone] Fax [phone]

## 2018-10-21 NOTE — Assessment & Plan Note (Signed)
-   Throat culture 10/21/2018 positive for Strep dysgalactiae - Sending Penicillin V 500mg  BID x 10 days - If no improvement recommend patient follow up with ENT (already established)

## 2018-10-21 NOTE — Patient Instructions (Addendum)
Orders: Throat culture  Sputum culture x 3 Labs - CBC with diff, BMET, mag  Rapid flu  Follow-up: Next visit 4/13 at 2:45 with Dr. Lamonte Sakai  ENT if throat culture negative d/t voice hoarseness  PCP/rheum for fibromyalgia management

## 2018-10-21 NOTE — Assessment & Plan Note (Signed)
-   Rapid flu negative  - Checking throat and sputum culture

## 2018-10-24 ENCOUNTER — Ambulatory Visit: Payer: Medicare Other | Admitting: Adult Health

## 2018-10-24 ENCOUNTER — Other Ambulatory Visit: Payer: Self-pay | Admitting: Emergency Medicine

## 2018-10-26 ENCOUNTER — Other Ambulatory Visit (HOSPITAL_COMMUNITY): Payer: Self-pay | Admitting: Physician Assistant

## 2018-10-27 ENCOUNTER — Other Ambulatory Visit: Payer: Medicare Other

## 2018-10-27 DIAGNOSIS — R6889 Other general symptoms and signs: Secondary | ICD-10-CM

## 2018-10-27 NOTE — Addendum Note (Signed)
Addended by: Suzzanne Cloud E on: 10/27/2018 11:52 AM   Modules accepted: Orders

## 2018-10-31 ENCOUNTER — Other Ambulatory Visit: Payer: Self-pay | Admitting: Physical Medicine & Rehabilitation

## 2018-10-31 DIAGNOSIS — E1142 Type 2 diabetes mellitus with diabetic polyneuropathy: Secondary | ICD-10-CM

## 2018-10-31 DIAGNOSIS — M797 Fibromyalgia: Secondary | ICD-10-CM

## 2018-11-02 ENCOUNTER — Other Ambulatory Visit: Payer: Self-pay

## 2018-11-02 ENCOUNTER — Ambulatory Visit (INDEPENDENT_AMBULATORY_CARE_PROVIDER_SITE_OTHER)
Admission: RE | Admit: 2018-11-02 | Discharge: 2018-11-02 | Disposition: A | Discharge status: Home / Self Care | Payer: Medicare Other | Source: Ambulatory Visit | Attending: Internal Medicine | Admitting: Internal Medicine

## 2018-11-02 ENCOUNTER — Encounter: Payer: Self-pay | Admitting: Internal Medicine

## 2018-11-02 ENCOUNTER — Ambulatory Visit (INDEPENDENT_AMBULATORY_CARE_PROVIDER_SITE_OTHER): Payer: Medicare Other | Admitting: Internal Medicine

## 2018-11-02 VITALS — BP 150/70 | HR 65 | Temp 98.5°F | Ht 61.0 in | Wt 253.0 lb

## 2018-11-02 DIAGNOSIS — R059 Cough, unspecified: Secondary | ICD-10-CM

## 2018-11-02 DIAGNOSIS — J449 Chronic obstructive pulmonary disease, unspecified: Secondary | ICD-10-CM | POA: Diagnosis not present

## 2018-11-02 DIAGNOSIS — R05 Cough: Secondary | ICD-10-CM | POA: Diagnosis not present

## 2018-11-02 DIAGNOSIS — R0602 Shortness of breath: Secondary | ICD-10-CM | POA: Diagnosis not present

## 2018-11-02 NOTE — Progress Notes (Signed)
Subjective:  Patient ID: Katherine [name], female    DOB: Jul 30, 1956  Age: 63 y.o. MRN: [id number]  CC: Cough   HPI Katherine [name] presents for concerns about a several month history of cough that is intermittently productive of brown and yellow phlegm.  She has been seeing a pulmonologist and has been prescribed 2 courses of cephalosporin antibiotics and most recently penicillin.  She complains of chronic myalgias, shortness of breath, an achy sensation in her chest, back pain, night sweats, and myalgias.  She denies fever, chills, pleuritic pain, or hemoptysis.  Outpatient Medications Prior to Visit  Medication Sig Dispense Refill  . acetaminophen (TYLENOL) 500 MG tablet Take 1 tablet (500 mg total) by mouth every 6 (six) hours as needed for moderate pain or headache. (Patient taking differently: Take 1,000 mg by mouth every 6 (six) hours as needed for moderate pain or headache. ) 30 tablet 0  . albuterol (PROVENTIL) (2.5 MG/3ML) 0.083% nebulizer solution Take 3 mLs (2.5 mg total) by nebulization every 6 (six) hours as needed for wheezing or shortness of breath. 150 mL 2  . albuterol (VENTOLIN HFA) 108 (90 Base) MCG/ACT inhaler Inhale 2 puffs into the lungs every 6 (six) hours as needed for wheezing or shortness of breath. 1 Inhaler 2  . azaTHIOprine (IMURAN) 50 MG tablet Take 125 mg by mouth every evening.     . budesonide-formoterol (SYMBICORT) 160-4.5 MCG/ACT inhaler TAKE 2 PUFFS BY MOUTH TWICE A DAY 1 Inhaler 5  . calcitRIOL (ROCALTROL) 0.25 MCG capsule Take 0.25 mcg by mouth every 3 (three) days.     . carbamazepine (TEGRETOL) 200 MG tablet TAKE 1 TABLET BY MOUTH TWICE A DAY 180 tablet 0  . clopidogrel (PLAVIX) 75 MG tablet TAKE 1 TABLET BY MOUTH EVERY DAY 30 tablet 0  . cyclobenzaprine (FLEXERIL) 10 MG tablet TAKE 1 TABLET BY MOUTH 3 TIMES DAILY AS NEEDED FOR MUSCLE SPASMS (Patient taking differently: Take 10 mg by mouth 2 (two) times daily. ) 90 tablet 2  . diltiazem (CARDIZEM  CD) 120 MG 24 hr capsule TAKE 1 CAPSULE (120 MG TOTAL) BY MOUTH DAILY. NEED OV. (Patient taking differently: Take 120 mg by mouth daily. ) 30 capsule 0  . dofetilide (TIKOSYN) 250 MCG capsule Take 1 capsule (250 mcg total) by mouth 2 (two) times daily. Please call and schedule an appt for further refills. 1st attempt 60 capsule 0  . DULoxetine (CYMBALTA) 30 MG capsule TAKE 2 CAPSULES (60 MG TOTAL) BY MOUTH EVERY MORNING. 180 capsule 2  . famotidine (PEPCID) 20 MG tablet Take 20 mg by mouth daily.    . ferrous sulfate 325 (65 FE) MG tablet Take 1 tablet (325 mg total) by mouth 3 (three) times daily with meals. 90 tablet 0  . fluticasone (FLONASE) 50 MCG/ACT nasal spray Place 2 sprays into both nostrils 2 (two) times daily. 48 g 1  . glimepiride (AMARYL) 4 MG tablet Take 4 mg by mouth every morning.  6  . guaiFENesin (MUCINEX) 600 MG 12 hr tablet Take 1 tablet (600 mg total) by mouth 2 (two) times daily. 30 tablet 0  . Insulin Glargine (BASAGLAR KWIKPEN) 100 UNIT/ML SOPN Inject 12 Units into the skin daily.    . metFORMIN (GLUCOPHAGE) 500 MG tablet Take 500 mg by mouth 2 (two) times daily.  6  . metoprolol tartrate (LOPRESSOR) 25 MG tablet TAKE 1 TABLET BY MOUTH TWICE A DAY 180 tablet 1  . montelukast (SINGULAIR) 10 MG tablet TAKE 1  TABLET BY MOUTH EVERY DAY 30 tablet 2  . nitroGLYCERIN (NITROSTAT) 0.4 MG SL tablet Place 1 tablet (0.4 mg total) under the tongue every 5 (five) minutes as needed for chest pain. 25 tablet 6  . NOVOLOG FLEXPEN 100 UNIT/ML FlexPen Inject 4 Units into the skin 2 (two) times daily as needed for high blood sugar.   6  . omeprazole (PRILOSEC) 40 MG capsule Take 40 mg by mouth every morning.     [name] Kitchen oxyCODONE (OXY IR/ROXICODONE) 5 MG immediate release tablet Take 1 tablet (5 mg total) by mouth every 8 (eight) hours as needed for severe pain. 60 tablet 0  . predniSONE (DELTASONE) 5 MG tablet Take 5 mg by mouth daily with breakfast.    . promethazine (PHENERGAN) 25 MG tablet Take 1  tablet (25 mg total) by mouth every 6 (six) hours as needed for nausea or vomiting (or sleep). 15 tablet 0  . rosuvastatin (CRESTOR) 20 MG tablet TAKE 1 TABLET BY MOUTH EVERY DAY 90 tablet 3  . sertraline (ZOLOFT) 100 MG tablet Take 100 mg by mouth every morning.    . warfarin (COUMADIN) 10 MG tablet TAKE 1 TABLET DAILY OR AS DIRECTED BY COUMADIN CLINIC 120 tablet 1  . cefdinir (OMNICEF) 300 MG capsule Take 1 capsule (300 mg total) by mouth 2 (two) times daily. 14 capsule 0  . clotrimazole (MYCELEX) 10 MG troche Take 1 tablet (10 mg total) by mouth 5 (five) times daily. 70 tablet 0  . LORazepam (ATIVAN) 1 MG tablet Take 1 mg by mouth every morning.     . penicillin v potassium (VEETID) 500 MG tablet 1 tab twice daily x 10 days 20 tablet 0  . predniSONE (DELTASONE) 10 MG tablet Take 4 tabs for 2 days, then 3 tabs for 2 days, then 2 tabs for 2 days, then 1 tab for 2 days, then stop 20 tablet 0   No facility-administered medications prior to visit.     ROS Review of Systems  Constitutional: Positive for fatigue. Negative for chills and unexpected weight change.  HENT: Negative.  Negative for sore throat and trouble swallowing.   Respiratory: Positive for cough and shortness of breath. Negative for chest tightness and wheezing.   Cardiovascular: Positive for chest pain. Negative for palpitations and leg swelling.  Gastrointestinal: Negative for abdominal pain, constipation, diarrhea and nausea.  Endocrine: Negative.   Genitourinary: Negative.  Negative for difficulty urinating and dysuria.  Musculoskeletal: Positive for back pain. Negative for arthralgias.  Skin: Negative.  Negative for color change, pallor and rash.  Neurological: Negative.  Negative for dizziness, weakness, light-headedness and headaches.  Hematological: Negative for adenopathy. Does not bruise/bleed easily.  Psychiatric/Behavioral: Negative.     Objective:  BP (!) 150/70 (BP Location: Left Arm, Patient Position: Sitting,  Cuff Size: Large)   Pulse 65   Temp 98.5 F (36.9 C) (Oral)   Ht 5\' 1"  (1.549 m)   Wt 253 lb (114.8 kg)   SpO2 96%   BMI 47.80 kg/m   BP Readings from Last 3 Encounters:  11/02/18 (!) 150/70  10/21/18 112/80  10/10/18 122/62    Wt Readings from Last 3 Encounters:  11/02/18 253 lb (114.8 kg)  10/21/18 251 lb 12.8 oz (114.2 kg)  10/10/18 252 lb (114.3 kg)    Physical Exam Vitals signs reviewed.  HENT:     Nose: Nose normal. No congestion or rhinorrhea.     Mouth/Throat:     Mouth: Mucous membranes are moist.  Pharynx: Oropharynx is clear. No oropharyngeal exudate or posterior oropharyngeal erythema.  Eyes:     General: No scleral icterus.    Conjunctiva/sclera: Conjunctivae normal.  Neck:     Musculoskeletal: Normal range of motion and neck supple. No muscular tenderness.  Cardiovascular:     Rate and Rhythm: Normal rate and regular rhythm.     Heart sounds: Murmur present. Systolic murmur present with a grade of 1/6. No diastolic murmur. No gallop.   Pulmonary:     Effort: Pulmonary effort is normal. No respiratory distress.     Breath sounds: No stridor. No wheezing, rhonchi or rales.  Abdominal:     Palpations: There is no hepatomegaly, splenomegaly or mass.     Tenderness: There is no abdominal tenderness.  Musculoskeletal: Normal range of motion.        General: No swelling.     Right lower leg: No edema.     Left lower leg: No edema.  Lymphadenopathy:     Cervical: No cervical adenopathy.  Skin:    General: Skin is warm and dry.  Neurological:     General: No focal deficit present.     Mental Status: She is oriented to person, place, and time. Mental status is at baseline.     Lab Results  Component Value Date   WBC 8.1 10/21/2018   HGB 12.1 10/21/2018   HCT 36.2 10/21/2018   PLT 261.0 10/21/2018   GLUCOSE 96 10/21/2018   CHOL 111 11/30/2017   TRIG 129.0 11/30/2017   HDL 53.90 11/30/2017   LDLCALC 32 11/30/2017   ALT 17 02/03/2018   AST 24  02/03/2018   NA 134 (L) 10/21/2018   K 4.4 10/21/2018   CL 100 10/21/2018   CREATININE 0.89 10/21/2018   BUN 18 10/21/2018   CO2 24 10/21/2018   TSH 3.118 04/20/2018   INR 5.5 (A) 10/21/2018   HGBA1C 7.0 (H) 07/04/2018   MICROALBUR 30 11/30/2017    Dg Chest 2 View  Result Date: 09/28/2018 CLINICAL DATA:  Productive cough, wheezing and shortness of breath. EXAM: CHEST - 2 VIEW COMPARISON:  07/04/2018 FINDINGS: Stable borderline cardiac enlargement and mild tortuosity of the thoracic aorta. 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 Peribronchial thickening and increased interstitial markings along with streaky areas of subsegmental atelectasis suggesting bronchitis. No focal infiltrates or pleural effusions. The bony thorax is intact. IMPRESSION: Findings suggest bronchitis.  No infiltrates or effusions. Electronically Signed   By: Marijo Sanes M.D.   On: 09/28/2018 12:52   No results found.   Assessment & Plan:   Kayln was seen today for cough.  Diagnoses and all orders for this visit:  Cough- Her chest x-ray is negative for mass or infiltrate.  I do not think another round of antibiotics is indicated. -     DG Chest 2 View; Future  Obstructive chronic bronchitis without exacerbation ([place])- I think this is the most likely explanation for her symptoms.  I have asked her to be more compliant with the LABA/ICS inhaler.   I have discontinued Katherine [name]'s LORazepam, clotrimazole, cefdinir, and penicillin v potassium. I am also having her maintain her omeprazole, azaTHIOprine, calcitRIOL, nitroGLYCERIN, sertraline, glimepiride, metFORMIN, acetaminophen, ferrous sulfate, diltiazem, famotidine, predniSONE, cyclobenzaprine, Basaglar KwikPen, NovoLOG FlexPen, guaiFENesin, promethazine, clopidogrel, fluticasone, warfarin, montelukast, carbamazepine, metoprolol tartrate, albuterol, oxyCODONE, albuterol, rosuvastatin, budesonide-formoterol, dofetilide, and DULoxetine.  No orders of  the defined types were placed in this encounter.    Follow-up: No follow-ups on file.  Marcello Moores  Ronnald Ramp, MD

## 2018-11-03 ENCOUNTER — Encounter: Payer: Self-pay | Admitting: Internal Medicine

## 2018-11-03 ENCOUNTER — Ambulatory Visit: Payer: Medicare Other | Admitting: Emergency Medicine

## 2018-11-03 DIAGNOSIS — J449 Chronic obstructive pulmonary disease, unspecified: Secondary | ICD-10-CM | POA: Insufficient documentation

## 2018-11-03 NOTE — Patient Instructions (Signed)
Cough, Adult  Coughing is a reflex that clears your throat and your airways. Coughing helps to heal and protect your lungs. It is normal to cough occasionally, but a cough that happens with other symptoms or lasts a long time may be a sign of a condition that needs treatment. A cough may last only 2-3 weeks (acute), or it may last longer than 8 weeks (chronic). What are the causes? Coughing is commonly caused by:  Breathing in substances that irritate your lungs.  A viral or bacterial respiratory infection.  Allergies.  Asthma.  Postnasal drip.  Smoking.  Acid backing up from the stomach into the esophagus (gastroesophageal reflux).  Certain medicines.  Chronic lung problems, including COPD (or rarely, lung cancer).  Other medical conditions such as heart failure. Follow these instructions at home: Pay attention to any changes in your symptoms. Take these actions to help with your discomfort:  Take medicines only as told by your health care provider. ? If you were prescribed an antibiotic medicine, take it as told by your health care provider. Do not stop taking the antibiotic even if you start to feel better. ? Talk with your health care provider before you take a cough suppressant medicine.  Drink enough fluid to keep your urine clear or pale yellow.  If the air is dry, use a cold steam vaporizer or humidifier in your bedroom or your home to help loosen secretions.  Avoid anything that causes you to cough at work or at home.  If your cough is worse at night, try sleeping in a semi-upright position.  Avoid cigarette smoke. If you smoke, quit smoking. If you need help quitting, ask your health care provider.  Avoid caffeine.  Avoid alcohol.  Rest as needed. Contact a health care provider if:  You have new symptoms.  You cough up pus.  Your cough does not get better after 2-3 weeks, or your cough gets worse.  You cannot control your cough with suppressant  medicines and you are losing sleep.  You develop pain that is getting worse or pain that is not controlled with pain medicines.  You have a fever.  You have unexplained weight loss.  You have night sweats. Get help right away if:  You cough up blood.  You have difficulty breathing.  Your heartbeat is very fast. This information is not intended to replace advice given to you by your health care provider. Make sure you discuss any questions you have with your health care provider. Document Released: 01/30/2011 Document Revised: 01/09/2016 Document Reviewed: 10/10/2014 Elsevier Interactive Patient Education  2019 Elsevier Inc.  

## 2018-11-08 ENCOUNTER — Other Ambulatory Visit: Payer: Self-pay | Admitting: Cardiology

## 2018-11-09 ENCOUNTER — Telehealth: Payer: Self-pay | Admitting: Internal Medicine

## 2018-11-09 NOTE — Telephone Encounter (Signed)
Copied from [place] [phone]. Topic: General - Other >> Nov 03, 2018  9:14 AM Oneta Rack wrote: Patient inquiring about imaging results, please advise >> Nov 09, 2018  4:09 PM Rayann Heman wrote: Pt calling back and states that she has still not received a call back about chest xray results. Please advise. Pt seemed upset on phone

## 2018-11-10 NOTE — Telephone Encounter (Signed)
Chest xray was ok No pneumonia

## 2018-11-10 NOTE — Telephone Encounter (Signed)
Pt is requesting imaging results.   Please advise.

## 2018-11-10 NOTE — Telephone Encounter (Signed)
Pt informed of results.

## 2018-11-11 ENCOUNTER — Telehealth: Payer: Self-pay | Admitting: Primary Care

## 2018-11-11 NOTE — Telephone Encounter (Signed)
Called Katherine [name] from [facility], no asnwer LM. Do not recommend adding Brovana to current therapy because it is a long acting bronchodilator and she is currently on Symbicort which is a combination medication with both ICS/LABA.   Advise she stop Albuterol if causing racing heart rate. Recommend trying xopenex HFA or nebulizers if needed for breakthrough shortness of breath/wheezing.

## 2018-11-12 ENCOUNTER — Encounter (HOSPITAL_BASED_OUTPATIENT_CLINIC_OR_DEPARTMENT_OTHER): Payer: Medicare Other

## 2018-11-14 NOTE — Telephone Encounter (Signed)
The patient is wanting to come off the Symbicort because it makes her heart race. The pharmacist called to let us know what they carry in case you wanted to switch it to something they carry. The patient's heart racing was initially reported by a therapist who called [place] and then I called the patient. She has A-fib and is taking Tikossyn and does not want to go into a-fib. She stated she used budesonide in the past and it helped.  Please advise.

## 2018-11-14 NOTE — Telephone Encounter (Signed)
I'm confused. She has asthma so any ICS/LABA combination is fine. Does she want to come off Symbicort? Are we trying to find cheaper alternatives to that? We could do perforomist + budesonide neb if she stops Symbicort.  The only alternative to Albuterol which is SABA is Levalbuterol/xopenex. Duoneb has albuterol in it so that would cause the same symptoms.

## 2018-11-14 NOTE — Telephone Encounter (Signed)
Sure. Please discontinue Symbicort and order Budesonide nebulizer twice daily

## 2018-11-14 NOTE — Telephone Encounter (Signed)
Returned call to [place] with [facility],they carry perforomist, budesonide, yupelri, and duoneb. They do not carry xopenex and they are trying to find something she can afford.   Beth please advise if any of these are recommended. If not I will have the patient call her insurance and find out what is on her formulary. Thanks.

## 2018-11-16 ENCOUNTER — Telehealth: Payer: Self-pay

## 2018-11-16 ENCOUNTER — Telehealth: Payer: Self-pay | Admitting: Emergency Medicine

## 2018-11-16 ENCOUNTER — Other Ambulatory Visit: Payer: Self-pay | Admitting: Internal Medicine

## 2018-11-16 ENCOUNTER — Ambulatory Visit: Payer: Medicare Other | Admitting: Internal Medicine

## 2018-11-16 DIAGNOSIS — B029 Zoster without complications: Secondary | ICD-10-CM

## 2018-11-16 MED ORDER — BUDESONIDE 0.5 MG/2ML IN SUSP: 0.5000 mg | Freq: Two times a day (BID) | RESPIRATORY_TRACT | 12 refills | Status: DC

## 2018-11-16 MED ORDER — VALACYCLOVIR HCL 500 MG PO TABS: 500.0000 mg | ORAL_TABLET | Freq: Every day | ORAL | 0 refills | Status: AC

## 2018-11-16 NOTE — Telephone Encounter (Signed)
Copied from [place] [phone]. Topic: Appointment Scheduling - Scheduling Inquiry for Clinic >> Nov 16, 2018  8:29 AM Ahmed Prima L wrote: Reason for CRM: patient said she had the shingle shot about 4 months ago. She is having blisters again beside her right breast underneath her arm. She did not think this would happen again since she had the shot. >> Nov 16, 2018  8:45 AM Morey Hummingbird wrote: Called patient and she said she is having another Shingles flare up just like before, She already received a Shingrix shot.  offered Virtual but she does not have an email. Please advise on what to do

## 2018-11-16 NOTE — Telephone Encounter (Signed)
Medication name and strength: Budesonide pulmicort nebulizer solution Provider: [facility]  Patient insurance ID: [id number]  Was the PA started on CMM?  Today 11/16/2018 If yes, please enter the Key: Key: [id number] - PA Case ID: [id number] - Rx #: [id number] Timeframe for approval/denial: 5-7 business day determination

## 2018-11-16 NOTE — Telephone Encounter (Signed)
I have sent an anti-viral medication to her pharmacy

## 2018-11-16 NOTE — Telephone Encounter (Signed)
I called the patient she is okay with medication change and requested that I send it to [facility] [address]. I have done so nothing further needed at this time.

## 2018-11-16 NOTE — Telephone Encounter (Signed)
Pt informed rx has been sent.  

## 2018-11-16 NOTE — Telephone Encounter (Signed)
Spoke to pt and she stated that she has another shingles rash over right flank near breast. She states that it is the same rash that she had last November.

## 2018-11-17 ENCOUNTER — Telehealth: Payer: Self-pay

## 2018-11-17 NOTE — Telephone Encounter (Signed)
Budesonide has been approved. 08/18/2018-11/16/2019. Call made to pharmacy to make aware. Call made to patient to make aware. Voices understanding. Nothing further is needed at this time.

## 2018-11-17 NOTE — Telephone Encounter (Signed)
Called to screen the pt but the pt informed me that she felt awful and I asked her to reschedule 2 weeks out

## 2018-11-21 ENCOUNTER — Encounter: Payer: Self-pay | Admitting: *Deleted

## 2018-11-21 ENCOUNTER — Other Ambulatory Visit: Payer: Self-pay | Admitting: *Deleted

## 2018-11-21 DIAGNOSIS — M797 Fibromyalgia: Secondary | ICD-10-CM

## 2018-11-21 DIAGNOSIS — M47816 Spondylosis without myelopathy or radiculopathy, lumbar region: Secondary | ICD-10-CM

## 2018-11-21 MED ORDER — OXYCODONE HCL 5 MG PO TABS: 5.0000 mg | ORAL_TABLET | Freq: Three times a day (TID) | ORAL | 0 refills | Status: DC | PRN

## 2018-11-21 NOTE — Telephone Encounter (Signed)
error 

## 2018-11-21 NOTE — Telephone Encounter (Signed)
Placed a call to Katherine [name], Her last Oxycodone was filled on 09/28/2018. She has an appointment with Dr. Naaman Plummer next week. Oxycodone e-scribed today, she verbalizes understanding.

## 2018-11-21 NOTE — Telephone Encounter (Signed)
Lea asked for a refill on her Oxycodone. Her last fill was 09/28/2018 per PMP. Her last appt was in January.

## 2018-11-21 NOTE — Addendum Note (Signed)
Addended by: Bayard Hugger on: 11/21/2018 06:13 PM   Modules accepted: Orders

## 2018-11-23 ENCOUNTER — Telehealth: Payer: Self-pay | Admitting: Emergency Medicine

## 2018-11-23 NOTE — Telephone Encounter (Signed)
Spoke with pt and she stated pulmicort nebs needed a PA.  Checked chart review and PA granted.  Called pharmacy and script went through and waiting for her.  Called pt and informed her of this.  Nothing further is needed.

## 2018-11-28 ENCOUNTER — Telehealth: Payer: Self-pay | Admitting: Adult Health

## 2018-11-28 ENCOUNTER — Ambulatory Visit: Payer: Medicare Other | Admitting: Emergency Medicine

## 2018-11-28 ENCOUNTER — Ambulatory Visit (INDEPENDENT_AMBULATORY_CARE_PROVIDER_SITE_OTHER): Payer: Medicare Other | Admitting: Adult Health

## 2018-11-28 ENCOUNTER — Encounter: Payer: Self-pay | Admitting: Adult Health

## 2018-11-28 ENCOUNTER — Other Ambulatory Visit: Payer: Self-pay

## 2018-11-28 ENCOUNTER — Telehealth: Payer: Self-pay | Admitting: Emergency Medicine

## 2018-11-28 DIAGNOSIS — B370 Candidal stomatitis: Secondary | ICD-10-CM | POA: Diagnosis not present

## 2018-11-28 DIAGNOSIS — J4541 Moderate persistent asthma with (acute) exacerbation: Secondary | ICD-10-CM

## 2018-11-28 DIAGNOSIS — J029 Acute pharyngitis, unspecified: Secondary | ICD-10-CM | POA: Diagnosis not present

## 2018-11-28 LAB — RESPIRATORY CULTURE OR RESPIRATORY AND SPUTUM CULTURE
MICRO NUMBER:: 311639
RESULT:: NORMAL
SPECIMEN QUALITY:: ADEQUATE

## 2018-11-28 LAB — FUNGUS CULTURE W SMEAR
MICRO NUMBER:: 311638
SMEAR: NONE SEEN
SPECIMEN QUALITY:: ADEQUATE

## 2018-11-28 MED ORDER — PROMETHAZINE-DM 6.25-15 MG/5ML PO SYRP: 5.0000 mL | ORAL_SOLUTION | Freq: Four times a day (QID) | ORAL | 0 refills | Status: DC | PRN

## 2018-11-28 MED ORDER — CLOTRIMAZOLE 10 MG MT TROC: 10.0000 mg | Freq: Every day | OROMUCOSAL | 0 refills | Status: DC

## 2018-11-28 MED ORDER — PREDNISONE 10 MG PO TABS: ORAL_TABLET | ORAL | 0 refills | Status: DC

## 2018-11-28 MED ORDER — AMOXICILLIN-POT CLAVULANATE 875-125 MG PO TABS: 1.0000 | ORAL_TABLET | Freq: Two times a day (BID) | ORAL | 0 refills | Status: AC

## 2018-11-28 NOTE — Addendum Note (Signed)
Addended by: Valerie Salts on: 11/28/2018 02:57 PM   Modules accepted: Orders

## 2018-11-28 NOTE — Telephone Encounter (Signed)
Received PA request from [facility] [address] for prednisone  [facility]  Went into CMM to submit PA  Message states that this med needs to be filed under medicare part B and not D  [facility] and advised him to please file under part B  He states that they will need a new rx for pred with dx in order to do this  I have sent new rx with dx associated

## 2018-11-28 NOTE — Telephone Encounter (Signed)
Primary Pulmonologist: RB Last office visit and with whom: 10/21/18 with Beth  What do we see them for (pulmonary problems): COPD, asthma and OSA  Reason for call: Patient stated that she has been having increased episodes of SOB and fatigue for the past 2 weeks. She stated that she was treated for strep throat from Dec-March and the sore throat has also returned. Denied having a fever, but did state that she has been having chills and body aches. Non-productive cough.   She has been taking her Symbicort 160, Pulmicort neb solution twice daily, albuterol rescue inhaler as needed with no relief.   She did mention that she took some of her husband's promethazine-codeine cough syrup and it did help with the cough. She wants to know if she could have a RX for her own.   In the last month, have you been in contact with someone who was confirmed or suspected to have [place]avirus / COVID-19?  No, but she did state that she lives with her husband who is currently being treated for PNA. This is his 3rd week.   Do you have any of the following symptoms developed in the last 30 days? Fever: No  Cough: Yes Shortness of breath: Yes  When did your symptoms start?  About 2-3 weeks ago  If the patient has a fever, what is the last reading?  (use n/a if patient denies fever)  N/A  Last Assessment:  Flu-like symptoms - Rapid flu negative  - Checking throat and sputum culture   Strep throat - Throat culture 10/21/2018 positive for Strep dysgalactiae - Sending Penicillin V 500mg  BID x 10 days - If no improvement recommend patient follow up with ENT (already established)   Martyn Ehrich, NP 10/21/2018  Patient is aware that RB is out of the office today and has requested to have this message send to TP.   TP, please advise. Thanks!

## 2018-11-28 NOTE — Telephone Encounter (Signed)
Please make televisit appointment

## 2018-11-28 NOTE — Progress Notes (Signed)
Virtual Visit via Telephone Note  I connected with Katherine [name] on 11/28/18 at  2:00 PM EDT by telephone and verified that I am speaking with the correct person using two identifiers.   I discussed the limitations, risks, security and privacy concerns of performing an evaluation and management service by telephone and the availability of in person appointments. I also discussed with the patient that there may be a patient responsible charge related to this service. The patient expressed understanding and agreed to proceed.   History of Present Illness: 63 year old female former smoker followed for obstructive sleep apnea, obesity hypoventilation syndrome, asthma, chronic cough complicated by GERD and aspiration.  Patient has a very complicated medical history with previous renal transplant on chronic steroids and Imuran.,  Coronary artery disease status post CABG. Morbid obesity status post lap band surgery  Today's tele-visit is an acute office visit for cough and sore throat .  Patient is present for today's visit at home, myself was present for today's visit at office.  Patient complains of recurrent cough wheezing and sore throat. Was treated in December for Bronchitis with Doxycycline. Seen again in February, treated with [facility] for Acute Bronchitis , Came back in March, dx w/ strep throat (positive culture) with Pen VK .  Says she got better in January and early February but then in late February symptoms return.  She did get better after [facility] and late February however symptoms returned in March with a sore throat.  At that office visit lab work was done that showed an essentially normal CBC and be met.  A strep test was positive.  She was treated with Pen-Vee K.  She says over the last week she started coughing again sore throat.  She says overall she does not feel good she has intermittent headaches body and joint aches nausea along with cough and intermittent wheezing.  Says in the  morning time she coughs up thick mucus. She says she is staying home.  She has not traveled anywhere.  She has been practicing extreme social distancing at home isolation.  Her husband is home with her.  He was diagnosed with pneumonia 6 weeks ago.  He is doing some better but has a lingering cough.  Her husband does have metastatic cancer.  She is under a lot of stress.  She is quite tearful and frustrated during today's visit.  Support was provided. According to the chart.  Patient was on Symbicort and budesonide and during her visit with Dr. Lamonte Sakai in February he recommended that she stop budesonide nebulizer.  Patient is on chronic immunosuppression with prednisone 5 mg daily.  Along with Imuran. She denies any fever.  She started Mycelex troches 2-3 daily for the last 2 days.  Says that she does not like taking albuterol nebulizer as it makes her heart rate too fast.  She is requesting cough syrup with promethazine DM.  Says she is used her husband which helps her cough. Chest x-ray March showed no acute process.     Observations/Objective: Chest x-ray November 02, 2018 showed no acute process.  Assessment and Plan: Acute Asthmatic Bronchitic exacerbation -treat with Augmentin x7 days.  Small prednisone burst x1 week.  If not improving will need office visit with chest x-ray.  As patient is high risk for atypical infections.  Patient is advised on red flags to watch out such as fever, confusion, decreased intake or output.  Sore throat questionable oral candidiasis patient is at risk with her chronic  immunosuppression, daily steroids, inhaled ICS x2. We will treat with Mycelex for 1 week.  If not improved will need a referral to ENT.  She was treated for strep throat 1 month ago.  With perceived clinical benefit.  Plan  Patient Instructions  Stop Budesonide Neb .  Continue on Symbicort 2 puffs Twice daily  , rinse well after use.  Take Mycelex troches  five daily for 1 week.  Salt water gargle  Twice daily  .  Try local honey with cough syrup As needed  Cough .  Promethazine DM 1 tsp every 6hr As needed  Cough , may make you sleepy.  Begin Augmentin 8434m Twice daily  For 1 week , take with food Eat yogurt Twice daily  .  Increase Prednisone 118mdaily for 1 week then back 34m42maily .  Follow up in 1 month and As needed   Please contact office for sooner follow up if symptoms do not improve or worsen or seek emergency care  If you throat is not better in 2 weeks will refer to ENT.        Follow Up Instructions: Follow-up in office in 4 to 6 weeks and as needed  Please contact office for sooner follow up if symptoms do not improve or worsen or seek emergency care      I discussed the assessment and treatment plan with the patient. The patient was provided an opportunity to ask questions and all were answered. The patient agreed with the plan and demonstrated an understanding of the instructions.   The patient was advised to call back or seek an in-person evaluation if the symptoms worsen or if the condition fails to improve as anticipated.  I provided 54 minutes of non-face-to-face time during this encounter.   TamRexene EdisonP

## 2018-11-28 NOTE — Telephone Encounter (Signed)
Spoke with patient. She has been scheduled for a televisit with TP at 2pm. I explained to her what a televisit was, she verbalized understanding and agreed to this.   Nothing further needed at time of call.

## 2018-11-28 NOTE — Patient Instructions (Addendum)
Stop Budesonide Neb .  Continue on Symbicort 2 puffs Twice daily  , rinse well after use.  Take Mycelex troches  five daily for 1 week.  Salt water gargle Twice daily  .  Try local honey with cough syrup As needed  Cough .  Promethazine DM 1 tsp every 6hr As needed  Cough , may make you sleepy.  Begin Augmentin 875mg  Twice daily  For 1 week , take with food Eat yogurt Twice daily  .  Increase Prednisone 10mg  daily for 1 week then back 5mg  daily .  Follow up in 1 month and As needed   Please contact office for sooner follow up if symptoms do not improve or worsen or seek emergency care  If you throat is not better in 2 weeks will refer to ENT.

## 2018-11-29 ENCOUNTER — Other Ambulatory Visit: Payer: Self-pay | Admitting: Physical Medicine & Rehabilitation

## 2018-11-29 ENCOUNTER — Encounter: Payer: Medicare Other | Admitting: Physical Medicine & Rehabilitation

## 2018-11-29 DIAGNOSIS — M47816 Spondylosis without myelopathy or radiculopathy, lumbar region: Secondary | ICD-10-CM

## 2018-11-29 DIAGNOSIS — M797 Fibromyalgia: Secondary | ICD-10-CM

## 2018-11-30 ENCOUNTER — Encounter: Payer: Medicare Other | Attending: Physical Medicine & Rehabilitation | Admitting: Physical Medicine & Rehabilitation

## 2018-11-30 ENCOUNTER — Other Ambulatory Visit: Payer: Self-pay

## 2018-11-30 ENCOUNTER — Encounter: Payer: Self-pay | Admitting: Physical Medicine & Rehabilitation

## 2018-11-30 VITALS — Ht 61.0 in | Wt 243.0 lb

## 2018-11-30 DIAGNOSIS — M170 Bilateral primary osteoarthritis of knee: Secondary | ICD-10-CM | POA: Insufficient documentation

## 2018-11-30 DIAGNOSIS — E1122 Type 2 diabetes mellitus with diabetic chronic kidney disease: Secondary | ICD-10-CM | POA: Insufficient documentation

## 2018-11-30 DIAGNOSIS — M79605 Pain in left leg: Secondary | ICD-10-CM | POA: Insufficient documentation

## 2018-11-30 DIAGNOSIS — M069 Rheumatoid arthritis, unspecified: Secondary | ICD-10-CM | POA: Insufficient documentation

## 2018-11-30 DIAGNOSIS — Z6841 Body Mass Index (BMI) 40.0 and over, adult: Secondary | ICD-10-CM | POA: Insufficient documentation

## 2018-11-30 DIAGNOSIS — M47816 Spondylosis without myelopathy or radiculopathy, lumbar region: Secondary | ICD-10-CM

## 2018-11-30 DIAGNOSIS — E669 Obesity, unspecified: Secondary | ICD-10-CM | POA: Insufficient documentation

## 2018-11-30 DIAGNOSIS — Z8673 Personal history of transient ischemic attack (TIA), and cerebral infarction without residual deficits: Secondary | ICD-10-CM | POA: Insufficient documentation

## 2018-11-30 DIAGNOSIS — Z940 Kidney transplant status: Secondary | ICD-10-CM | POA: Insufficient documentation

## 2018-11-30 DIAGNOSIS — G894 Chronic pain syndrome: Secondary | ICD-10-CM | POA: Diagnosis not present

## 2018-11-30 DIAGNOSIS — Z9851 Tubal ligation status: Secondary | ICD-10-CM | POA: Insufficient documentation

## 2018-11-30 DIAGNOSIS — Z87891 Personal history of nicotine dependence: Secondary | ICD-10-CM | POA: Insufficient documentation

## 2018-11-30 DIAGNOSIS — Z9049 Acquired absence of other specified parts of digestive tract: Secondary | ICD-10-CM | POA: Insufficient documentation

## 2018-11-30 DIAGNOSIS — G4733 Obstructive sleep apnea (adult) (pediatric): Secondary | ICD-10-CM | POA: Insufficient documentation

## 2018-11-30 DIAGNOSIS — E1142 Type 2 diabetes mellitus with diabetic polyneuropathy: Secondary | ICD-10-CM

## 2018-11-30 DIAGNOSIS — M7591 Shoulder lesion, unspecified, right shoulder: Secondary | ICD-10-CM | POA: Insufficient documentation

## 2018-11-30 DIAGNOSIS — M797 Fibromyalgia: Secondary | ICD-10-CM

## 2018-11-30 DIAGNOSIS — J449 Chronic obstructive pulmonary disease, unspecified: Secondary | ICD-10-CM | POA: Insufficient documentation

## 2018-11-30 DIAGNOSIS — Z951 Presence of aortocoronary bypass graft: Secondary | ICD-10-CM | POA: Insufficient documentation

## 2018-11-30 DIAGNOSIS — Z955 Presence of coronary angioplasty implant and graft: Secondary | ICD-10-CM | POA: Insufficient documentation

## 2018-11-30 DIAGNOSIS — I120 Hypertensive chronic kidney disease with stage 5 chronic kidney disease or end stage renal disease: Secondary | ICD-10-CM | POA: Insufficient documentation

## 2018-11-30 DIAGNOSIS — E785 Hyperlipidemia, unspecified: Secondary | ICD-10-CM | POA: Insufficient documentation

## 2018-11-30 DIAGNOSIS — E1151 Type 2 diabetes mellitus with diabetic peripheral angiopathy without gangrene: Secondary | ICD-10-CM | POA: Insufficient documentation

## 2018-11-30 DIAGNOSIS — F418 Other specified anxiety disorders: Secondary | ICD-10-CM | POA: Insufficient documentation

## 2018-11-30 DIAGNOSIS — M545 Low back pain: Secondary | ICD-10-CM | POA: Insufficient documentation

## 2018-11-30 DIAGNOSIS — F329 Major depressive disorder, single episode, unspecified: Secondary | ICD-10-CM

## 2018-11-30 DIAGNOSIS — M79604 Pain in right leg: Secondary | ICD-10-CM | POA: Insufficient documentation

## 2018-11-30 DIAGNOSIS — I2510 Atherosclerotic heart disease of native coronary artery without angina pectoris: Secondary | ICD-10-CM | POA: Insufficient documentation

## 2018-11-30 MED ORDER — DULOXETINE HCL 30 MG PO CPEP: 90.0000 mg | ORAL_CAPSULE | Freq: Every morning | ORAL | 2 refills | Status: DC

## 2018-11-30 MED ORDER — OXYCODONE HCL 5 MG PO TABS: 5.0000 mg | ORAL_TABLET | Freq: Three times a day (TID) | ORAL | 0 refills | Status: DC | PRN

## 2018-11-30 MED ORDER — SERTRALINE HCL 50 MG PO TABS: 50.0000 mg | ORAL_TABLET | Freq: Every morning | ORAL | 3 refills | Status: DC

## 2018-11-30 NOTE — Progress Notes (Signed)
Subjective:    Patient ID: Katherine [name], female    DOB: 1955-12-12, 63 y.o.   MRN: [id number]  HPI   Due to national recommendations of social distancing because of COVID 67, an audio/video tele-health visit is felt to be the most appropriate encounter for this patient at this time. See MyChart message from today for the patient's consent to a tele-health encounter with [place]. This is a follow up telephone visit for the patient who is at home. MD is at office.     Katherine [name] presents in follow up of her chronic pain.  She reports that she has had ongoing respiratory symptoms which has been frustrating for her.  She has seen her family physician and has been on numerous antibiotics, etc. has had persistent complaints.  Symptoms date back to December or earlier.  She denies any current fever chills shortness of breath etc.  Her mood remains an ongoing issue.  She still dealing with the loss of her brother and now her sister apparently has end-stage pulmonary fibrosis and is on hospice.  Patient became very tearful when discussing her care in the concerns that she had about losing another sibling.  She has some interest in adjusting her antidepressant medication again.  She is on Cymbalta and Zoloft currently 60 and 100 mg respectively.  For pain she is taking oxycodone as needed.  She has become more sedentary over the last few months from her respiratory symptoms as well as related to her mood and coronavirus limitations.  She feels that she is gotten weaker.  She is had a few more falls at home as well.  Often she is tripped over objects in the house.  She is using a walker for support.  Pain Inventory Average Pain 9 Pain Right Now 7 My pain is constant, sharp, burning, dull, stabbing, tingling and aching  In the last 24 hours, has pain interfered with the following? General activity 7 Relation with others 7 Enjoyment of life 6 What TIME of day is  your pain at its worst? morning Sleep (in general) Fair  Pain is worse with: walking, standing and some activites Pain improves with: rest, pacing activities and medication Relief from Meds: 5  Mobility use a walker how many minutes can you walk? 3 ability to climb steps?  yes do you drive?  yes  Function disabled: date disabled na I need assistance with the following:  meal prep, household duties and shopping  Neuro/Psych bowel control problems weakness numbness tremor tingling trouble walking dizziness  Prior Studies Any changes since last visit?  no  Physicians involved in your care Any changes since last visit?  yes   Family History  Problem Relation Age of Onset   Cancer Mother        liver   Heart disease Father    Cancer Father        colon   Arrhythmia Brother        Atrial Fibrillation   Arrhythmia Paternal Aunt        Atrial Fibrillation   Social History   Socioeconomic History   Marital status: Married    Spouse name: Not on file   Number of children: Not on file   Years of education: Not on file   Highest education level: Not on file  Occupational History   Not on file  Social Needs   Financial resource strain: Not on file   Food insecurity:  Worry: Not on file    Inability: Not on file   Transportation needs:    Medical: Not on file    Non-medical: Not on file  Tobacco Use   Smoking status: Former Smoker    Packs/day: 1.00    Years: 30.00    Pack years: 30.00    Types: Cigarettes    Last attempt to quit: 08/17/2002    Years since quitting: 16.2   Smokeless tobacco: Never Used  Substance and Sexual Activity   Alcohol use: No   Drug use: No   Sexual activity: Not on file  Lifestyle   Physical activity:    Days per week: Not on file    Minutes per session: Not on file   Stress: Not on file  Relationships   Social connections:    Talks on phone: Not on file    Gets together: Not on file    Attends  religious service: Not on file    Active member of club or organization: Not on file    Attends meetings of clubs or organizations: Not on file    Relationship status: Not on file  Other Topics Concern   Not on file  Social History Narrative   She is currently married, and the caregiver of her husband who is recovering from surgery for tongue cancer now diagnosed with lung cancer. Prior to his diagnosis of her husband, she actually had adopted a 36-year-old child who she knows caring for as well. With all the surgeries, they have been quite financially troubled. Thanks the help of her community and church, they have been able to stay "alfoat."     She is a former smoker who quit in 2004 after a 30-pack-year history.   She is active chasing a 77-year-old child, does not do routine exercise. She's been quite depressed with the condition of her husband, and admits to eating comfort herself.   She does not drink alcohol.   Past Surgical History:  Procedure Laterality Date   ABDOMINAL AORTOGRAM N/A 04/21/2018   Procedure: ABDOMINAL AORTOGRAM;  Surgeon: [name], MD;  Location: [place] [facility];  Service: Cardiovascular;  Laterality: N/A;   CARDIAC CATHETERIZATION  5/'01, 3/'02, 8/'03, 10/'04; 1/'15   08/22/2013: LAD & RCA 100%; LIMA-LAD & SVG-rPDA patent; Cx-- OM1 60%, OM2 ostial ~50%; SVG-D1 - 80% mid, 50% distal ISR --PCI   CATHETER REMOVAL     CHOLECYSTECTOMY N/A 10/29/2014   Procedure: LAPAROSCOPIC CHOLECYSTECTOMY WITH INTRAOPERATIVE CHOLANGIOGRAM;  Surgeon: Excell Seltzer, MD;  Location: [facility];  Service: General;  Laterality: N/A;   CORONARY ANGIOPLASTY  1994   x5   CORONARY ARTERY BYPASS GRAFT  1995   LIMA-LAD, SVG-RPDA, SVG-D1   ESOPHAGOGASTRODUODENOSCOPY N/A 10/15/2016   Procedure: ESOPHAGOGASTRODUODENOSCOPY (EGD);  Surgeon: Wilford Corner, MD;  Location: [facility];  Service: Endoscopy;  Laterality: N/A;   I&D EXTREMITY Right 01/29/2018   Procedure: IRRIGATION AND  DEBRIDEMENT THUMB;  Surgeon: Dayna Barker, MD;  Location: [place];  Service: Plastics;  Laterality: Right;   INCISE AND DRAIN ABCESS     KIDNEY TRANSPLANT  1991   KNEE ARTHROSCOPY WITH LATERAL MENISECTOMY Left 12/03/2017   Procedure: LEFT KNEE ARTHROSCOPY WITH LATERAL MENISECTOMY;  Surgeon: Earlie Server, MD;  Location: [place];  Service: Orthopedics;  Laterality: Left;   LAPAROSCOPIC GASTRIC BANDING  04/2004; 10/'09, 2/'10   Port Replacement x 2   LEFT HEART CATH AND CORS/GRAFTS ANGIOGRAPHY N/A 04/21/2018   Procedure: LEFT HEART CATH AND CORS/GRAFTS ANGIOGRAPHY;  Surgeon: [name], MD;  Location: [place] [facility];  Service: Cardiovascular;  Laterality: N/A;   LEFT HEART CATHETERIZATION WITH CORONARY/GRAFT ANGIOGRAM N/A 08/23/2013   Procedure: LEFT HEART CATHETERIZATION WITH Beatrix Fetters;  Surgeon: [place] Hampshire, MD;  Location: [place] [facility];  Service: Cardiovascular;  Laterality: N/A;   Lower Extremity Arterial Dopplers  08/2013   ABI: R 0.96, L 1.04   MULTIPLE TOOTH EXTRACTIONS  age 62   NM MYOVIEW LTD  03/2016   EF 62%. LOW RISK. C/W prior MI - no Ischemia. Apical hypokinesis.   PERCUTANEOUS CORONARY STENT INTERVENTION (PCI-S)  5/'01, 3/'02, 8/'03, 10/'04;   '01 - S660 BMS 2.5 x 9 - dSVG-D1 into D1; '02- post-stent stenosis - 2.5 x 8 Pixel BMS; '8\03: ISR/Thrombosis into native D1 - AngioJet, 2.5 x 13 Pixel; '04 - ISR 95% - covered stented area with Taxus DES 2.5 mm x 20 (2.88)   PERCUTANEOUS CORONARY STENT INTERVENTION (PCI-S)  08/23/2013   Procedure: PERCUTANEOUS CORONARY STENT INTERVENTION (PCI-S);  Surgeon: [place] Hampshire, MD;  Location: [facility] CATH LAB;  Service: Cardiovascular;;mid SVG-D1 80%; distal stent ~50% ISR; Promus Prermier DES 2.75 mm xc 20 mm (2.8 mm)   PORT-A-CATH REMOVAL     kidney   TRANSTHORACIC ECHOCARDIOGRAM  02/2016   EF 55-60%. Septal dyssynergy from CABG GR 2 DD. Aortic valve is trileaflet the functional bicuspid with sclerosis but not yet  notable for stenosis stenosis.; no Significant change   TUBAL LIGATION     wrist fistula repair Left    dialysis for one year   Past Medical History:  Diagnosis Date   Anemia    Anxiety    Bilateral carotid artery stenosis    Carotid duplex 04/1477: 2-95% LICA, 62-13% RICA, >08% RECA, f/u 1 yr suggested   CAD (coronary artery disease) of bypass graft 5/01; 3/'02, 8/'03, 10/'04; 1/15   PCI x 5 to SVG-D1    CAD in native artery 07/1993    3 Vessel Disease (LAD-D1 & RCA) -- CABG   CAD S/P percutaneous coronary angioplasty    PCI to SVG-D1 insertion/native D1 x 4 = '01 -(S660 BMS 2.5 x 9 - insertion into D1; '02 - distal overlap ACS Pixel 2.5 x 8  BMS; '03 distal/native ISR/Thrombosis - Pixel 2.5 x 13; '04 - ISR-  Taxus 2.5 x 20 (covered all);; 1/15 - mid SVG-D1 (50% distal ISR) - Promus P 2.75 x 20 -- 2.8 mm   COPD mixed type (HCC)    Followed by Dr. Lamonte Sakai "pulmonologist said no COPD"   Depression    Depression with anxiety    Diabetes mellitus type 2 in obese ([place])    Diarrhea    started after cholecystectomy and mass removed from intestine   Dyslipidemia, goal LDL below 70    08/2012: TC 137, TG 200, HDL 32!, LDL 45; on statin (followed by Dr.Deterding)   ESRD (end stage renal disease) ([place]) 1991   s/p Cadaveric Renal Transplant [facility] - Dr. Jimmy Footman)    Family history of adverse reaction to anesthesia    mom's bp dropped during/after anesthesia   Fibromyalgia    GERD (gastroesophageal reflux disease)    Glomerulonephritis, chronic, rapidly progressive 1989   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 07/1993   Rescue PTCA of RCA -- referred for CABG.   H/O: GI bleed    Headache    migraines in the past   History of kidney stones    Hypertension associated  with diabetes ([place])    MRSA (methicillin resistant staph aureus) culture positive    Obesity    s/p Lap Band 04/2004- Port Replacement 10/09 & 2/10 for infection; Dr.  Excell Seltzer.   OSA (obstructive sleep apnea)    no longer on CPAP or home O2, states she doesn't need now after lap band   PAD (peripheral artery disease) ([place]) 08/2013   LEA Dopplers to be read by Dr. Fletcher Anon   PAF (paroxysmal atrial fibrillation) ([place]) 06/2014   Noted on CardioNet Monitor  - rate ~112; no recurrent symptoms. Nonionic regulation because of frequent GI procedures. Patient prefers Plavix   Pneumonia    Recurrent boils    Bilateral Groin   Rheumatoid arthritis ([place])    Per Patient Report; associated with OA   S/P CABG x 3 08/1993   Dr. Servando Snare: LIMA-LAD, SVG-bifurcatingD1, SVG-rPDA   S/p cadaver renal transplant 1991   [place]   Stroke [facility]) 2012   "right eye stroke- half blind now"   Unstable angina ([place]) 5/01; 3/'02, 8/'03, 10/'04; 1/15   x 5 occurences since Inf-Post STEMI in 1994   Ht 5\' 1"  (1.549 m)    Wt 243 lb (110.2 kg)    BMI 45.91 kg/m   Opioid Risk Score:   Fall Risk Score:  `1  Depression screen PHQ 2/9  Depression screen [facility] 2/9 11/02/2018 07/04/2018 03/29/2017 05/19/2016 03/30/2016  Decreased Interest 3 3 3 3 3   Down, Depressed, Hopeless 3 3 3 3 3   PHQ - 2 Score 6 6 6 6 6   Altered sleeping 1 3 - - 3  Tired, decreased energy 1 3 - - 3  Change in appetite 1 2 - - 2  Feeling bad or failure about yourself  0 3 - - 3  Trouble concentrating 0 2 - - 2  Moving slowly or fidgety/restless 0 0 - - 0  Suicidal thoughts 0 0 - - 0  PHQ-9 Score 9 19 - - 19  Difficult doing work/chores - Very difficult - - -  Some recent data might be hidden    Review of Systems  Constitutional: Positive for chills, diaphoresis and fever.  HENT: Negative.   Eyes: Negative.   Respiratory: Positive for cough.   Cardiovascular: Negative.   Gastrointestinal: Positive for nausea and vomiting.  Endocrine: Negative.   Genitourinary: Negative.   Musculoskeletal: Positive for arthralgias, back pain and myalgias.       Hip, leg, ankle, wrist pain   Skin: Negative.     Allergic/Immunologic: Negative.   Neurological: Positive for dizziness, weakness, numbness and headaches.       Tingling   Hematological: Negative.   Psychiatric/Behavioral: Negative.   All other systems reviewed and are negative.       Assessment & Plan:  1. Fibromyalgia 2. Rheumatoid Arthritis by history 3. Osteoarthritis--polyarticular, bilateral knees quite involved, left more than right 4. ESRD with hx of renal transplant on chronic steroids 5. Right rotator cuff tendonitis/like degenerative/inflammatory changes in the right shoulder also.  6. Hx o falls,gait disorder 7. Right low back pain with radicular symptoms. spondylosis on most recent plain film 8. SCC of right thumb 9. Shingles right lower back.   Plan: 1. She will speak with PCP regarding ongoing URI symptoms. Has had symptom off and on since December.  . 2. oxycodone 5mg  q8 prn, #60--rx written 4/6, wrote her another rx for 12/19/2018. -We will continue the controlled substance monitoring program, this consists of regular clinic visits, examinations, routine drug  screening, pill counts as well as use of [place] Controlled Substance Reporting System. [place].   3. Changecymbalta to 90mg  daily,reduce zoloft to 50mg  daily.  She will call me with any side effects or complaints. 4. Continue tegretol200mg  BID for FMS/neuropathic pain 5. discussed need for LE strengthenging to improve her base of support and hopefully reduce fall risk.  Would like to get her into some sort of physical therapy at some point. 6.Left knee mgt per Dr. French Ana 8.Lumbar injections are still on the tablebut given the recent issues with her atrial fibrillation and the fact that she is on Coumadin/plavix, injections remain on hold. 9.Continue with Dr. Sima Matas for coping with recent pain/loss of brother.    16  minutes on phone today. Follow up with NP in about 2 months. [name] Kitchen

## 2018-12-01 ENCOUNTER — Other Ambulatory Visit: Payer: Self-pay | Admitting: Emergency Medicine

## 2018-12-03 ENCOUNTER — Other Ambulatory Visit: Payer: Self-pay | Admitting: Physician Assistant

## 2018-12-05 ENCOUNTER — Telehealth: Payer: Self-pay

## 2018-12-05 NOTE — Telephone Encounter (Signed)

## 2018-12-06 ENCOUNTER — Ambulatory Visit (INDEPENDENT_AMBULATORY_CARE_PROVIDER_SITE_OTHER): Payer: Medicare Other | Admitting: *Deleted

## 2018-12-06 ENCOUNTER — Encounter: Payer: Self-pay | Admitting: *Deleted

## 2018-12-06 ENCOUNTER — Other Ambulatory Visit: Payer: Self-pay

## 2018-12-06 ENCOUNTER — Other Ambulatory Visit: Payer: Self-pay | Admitting: *Deleted

## 2018-12-06 DIAGNOSIS — I480 Paroxysmal atrial fibrillation: Secondary | ICD-10-CM

## 2018-12-06 DIAGNOSIS — Z7901 Long term (current) use of anticoagulants: Secondary | ICD-10-CM

## 2018-12-06 LAB — POCT INR: INR: 3.6 — AB (ref 2.0–3.0)

## 2018-12-06 NOTE — Patient Outreach (Signed)
[place] [facility]) Care Management [place] Telephone outreach- high risk screening referral from insurance provider Unsuccessful outreach attempt # 1 12/06/2018  Katherine [name] 19-Jan-1956 [id number]  Unsuccessful telephone outreach to Katherine [name], 63 y/o female referred to [facility] CM by insurance provider for high risk screening assessment.  Patient has history including, but not limited to, CAD with previous CABG x 3; fibromyalgia; HLD/ HTN; DM; paroxysmal Atrial Fibrillation; GERD; OSA; PAD; asthma/ chronic bronchitis; and previous kidney transplant.  Unable to leave patient HIPAA compliant voice mail message, as I received automated outgoing voice mail stating that 'the wireless customer you are trying to reach is not available to take your call;" no option to leave patient voice message requesting call back  Plan:  Will place [facility] CM unsuccessful patient outreach letter in mail requesting call back in writing  Will re-attempt [place] telephone outreach again tomorrow if I do not hear back from patient first.  Oneta Rack, RN, BSN, [employer] Coordinator [facility] Care Management  [phone]

## 2018-12-06 NOTE — Patient Instructions (Signed)
Description   Spoke with pt and instructed pt to hold tomorrow's dose then change to 1 tablet daily except 1/2 tablet on Sundays. Recheck INR in 2 weeks. [facility] [phone] call with procedures or new medications.

## 2018-12-07 ENCOUNTER — Encounter: Payer: Self-pay | Admitting: *Deleted

## 2018-12-07 ENCOUNTER — Other Ambulatory Visit: Payer: Self-pay | Admitting: *Deleted

## 2018-12-07 NOTE — Patient Outreach (Signed)
[place] [facility]) Care Management [facility] CM Screening call - insurance high risk referral Unsuccessful consecutive outreach attempt # 2 12/07/2018  Katherine [name] 1956/02/20 [id number]  11:10 am: Unsuccessful telephone outreach to Katherine [name], 63 y/o female referred to [facility] CM by insurance provider for high risk screening assessment.  Patient has history including, but not limited to, CAD with previous CABG x 3; fibromyalgia; HLD/ HTN; DM; paroxysmal Atrial Fibrillation; GERD; OSA; PAD; asthma/ chronic bronchitis; and previous kidney transplant.  With today's call attempt, I was again unable to leave patient HIPAA compliant voice mail message, as I received automated outgoing voice mail stating that 'the wireless customer you are trying to reach is not available to take your call; please try your call later;" no option to leave patient voice message requesting call back  Plan:  Verified [place] unsuccessful patient outreach letter in mail yesterday requesting call back in writing   Will re-attempt [place] telephone outreach later this week if I do not hear back from patient first.  Oneta Rack, RN, BSN, [place] Coordinator [facility] Care Management  [phone]

## 2018-12-09 ENCOUNTER — Other Ambulatory Visit: Payer: Self-pay | Admitting: *Deleted

## 2018-12-09 ENCOUNTER — Encounter: Payer: Self-pay | Admitting: *Deleted

## 2018-12-09 NOTE — Patient Outreach (Signed)
[place] [facility]) Care Management [facility] CM Screening call- insurance referral Unsuccessful consecutive outreach attempt # 3 12/09/2018  Katherine [name] 19-Apr-1956 [id number]  09:50 am: Unsuccessful telephone outreach to Katherine [name], 63 y/o female referred to [facility] CM by insurance provider for high risk screening assessment.Patient has history including, but not limited to, CAD with previous CABG x 3; fibromyalgia; HLD/ HTN; DM; paroxysmal Atrial Fibrillation; GERD; OSA; PAD; asthma/ chronic bronchitis; and previous kidney transplant.  With today's call attempt, I was again unable to leave patientHIPAA compliant voice mail message, as I received automated outgoing voice mail stating that 'the wireless customer you are trying to reach is not available to take your call; please try your call later;" no option to leave patient voice message requesting call back  Plan:  Verified [place] unsuccessful patient outreach letter in mail December 06, 2018 requesting call back in writing   Will await patient follow up and proceed accordingly, if patient responds to previous request for follow up/ return call  Katherine Rack, Katherine [name], Katherine [name], [employer] Coordinator [facility] Care Management  [phone]

## 2018-12-14 ENCOUNTER — Other Ambulatory Visit: Payer: Self-pay | Admitting: Internal Medicine

## 2018-12-14 LAB — AFB CULTURE WITH SMEAR (NOT AT ARMC)
ACID FAST SMEAR: NEGATIVE
Acid Fast Culture: NEGATIVE

## 2018-12-16 ENCOUNTER — Ambulatory Visit (INDEPENDENT_AMBULATORY_CARE_PROVIDER_SITE_OTHER): Payer: Medicare Other | Admitting: Nurse Practitioner

## 2018-12-16 ENCOUNTER — Encounter: Payer: Self-pay | Admitting: Nurse Practitioner

## 2018-12-16 ENCOUNTER — Other Ambulatory Visit: Payer: Self-pay

## 2018-12-16 DIAGNOSIS — R0602 Shortness of breath: Secondary | ICD-10-CM | POA: Diagnosis not present

## 2018-12-16 DIAGNOSIS — J45909 Unspecified asthma, uncomplicated: Secondary | ICD-10-CM

## 2018-12-16 MED ORDER — PREDNISONE 10 MG PO TABS: 20.0000 mg | ORAL_TABLET | Freq: Every day | ORAL | 0 refills | Status: AC

## 2018-12-16 MED ORDER — DOXYCYCLINE HYCLATE 100 MG PO TABS: 100.0000 mg | ORAL_TABLET | Freq: Two times a day (BID) | ORAL | 0 refills | Status: DC

## 2018-12-16 NOTE — Progress Notes (Signed)
Virtual Visit via Telephone Note  I connected with Katherine [name] on 12/16/18 at 11:30 AM EDT by telephone and verified that I am speaking with the correct person using two identifiers.  Location: Patient: home Provider: office   I discussed the limitations, risks, security and privacy concerns of performing an evaluation and management service by telephone and the availability of in person appointments. I also discussed with the patient that there may be a patient responsible charge related to this service. The patient expressed understanding and agreed to proceed.   History of Present Illness: 63 year old female patient followed in our office for obstructive sleep apnea and restrictive lung disease  PMH: Obesity, status post lap band surgery, renal transplant (on chronic immunosuppression with Imuran and prednisone) Smoker/ Smoking History: Former smoker.  Quit 2004.  30-pack-year.  Dr. Lake Bells Maintenance: Symbicort 160 Pt of: Dr. Lamonte Sakai  Patient has a tele-visit today for an acute visit.  She was last seen by Rexene Edison on 11/28/2018.  She was treated for asthmatic bronchitis with Augmentin and prednisone.  Patient states that she did slightly improve but symptoms returned.  She complains today of fatigue, cough that is nonproductive, poor appetite.  She also complains of wheezing.  She is very upset today because she just found out her aunt passed away.  She denies any recent fever.  She states that her sore throat is better now.  She is compliant with Symbicort.  Patient states that she has been staying at home.  She denies any recent travel.  Denies f/c/s, n/v/d, hemoptysis, PND, leg swelling.     Observations/Objective:  CXR 11/02/18 - No acute abnormality noted.  07/04/2018-chest x-ray-chronic bronchitic changes no alveolar pneumonia or CHF  10/27/2017-echocardiogram- LV ejection fraction 55 to 60%, moderate LVH, grade 2 diastolic dysfunction  12/15/256-NIDPOEUMPN-TIR 2  (69% predicted), ratio 80%, FEV1 72%  Assessment and Plan: Patient continues to experience shortness of breath, fatigue, cough that is nonproductive, poor appetite and wheezing.  Earlier this month she completed a round of Augmentin and prednisone.  States that symptoms did improve but returned.  She no longer has a severe sore throat as she did before.  Denies any recent fever.  I will order chest x-ray -concern for possible pneumonia.  Patient Instructions  Asthmatic bronchitis:  Continue on Symbicort 2 puffs Twice daily  , rinse well after use.  Try local honey with cough syrup As needed  Cough .  Will order chest x ray - patient states that she cannot come to the office today, but will come Monday for x ray Will check labs Eat yogurt Twice daily  .  Increase Prednisone 20 mg daily for 1 week then back 5mg  daily   Follow Up Instructions:  Follow up in 1 month and As needed   Please contact office for sooner follow up if symptoms do not improve or worsen or seek emergency care     I discussed the assessment and treatment plan with the patient. The patient was provided an opportunity to ask questions and all were answered. The patient agreed with the plan and demonstrated an understanding of the instructions.   The patient was advised to call back or seek an in-person evaluation if the symptoms worsen or if the condition fails to improve as anticipated.  I provided 22 minutes of non-face-to-face time during this encounter.   Fenton Foy, NP

## 2018-12-16 NOTE — Patient Instructions (Signed)
Asthmatic bronchitis:  Continue on Symbicort 2 puffs Twice daily  , rinse well after use.  Try local honey with cough syrup As needed  Cough .  Will order chest x ray - patient states that she cannot come to the office today, but will come Monday for x ray Will check labs Eat yogurt Twice daily  .  Increase Prednisone 20 mg daily for 1 week then back 5mg  daily   Follow up in 1 month and As needed   Please contact office for sooner follow up if symptoms do not improve or worsen or seek emergency care

## 2018-12-16 NOTE — Assessment & Plan Note (Signed)
Patient continues to experience shortness of breath, fatigue, cough that is nonproductive, poor appetite and wheezing.  Earlier this month she completed a round of Augmentin and prednisone.  States that symptoms did improve but returned.  She no longer has a severe sore throat as she did before.  Denies any recent fever.  I will order chest x-ray -concern for possible pneumonia.  Patient Instructions  Asthmatic bronchitis:  Continue on Symbicort 2 puffs Twice daily  , rinse well after use.  Try local honey with cough syrup As needed  Cough .  Will order chest x ray - patient states that she cannot come to the office today, but will come Monday for x ray Will check labs Eat yogurt Twice daily  .  Increase Prednisone 20 mg daily for 1 week then back 5mg  daily   Follow up in 1 month and As needed   Please contact office for sooner follow up if symptoms do not improve or worsen or seek emergency care

## 2018-12-19 ENCOUNTER — Ambulatory Visit (INDEPENDENT_AMBULATORY_CARE_PROVIDER_SITE_OTHER): Payer: Medicare Other

## 2018-12-19 ENCOUNTER — Telehealth: Payer: Self-pay

## 2018-12-19 ENCOUNTER — Encounter (HOSPITAL_BASED_OUTPATIENT_CLINIC_OR_DEPARTMENT_OTHER): Payer: Medicare Other

## 2018-12-19 DIAGNOSIS — R0602 Shortness of breath: Secondary | ICD-10-CM | POA: Diagnosis not present

## 2018-12-19 NOTE — Telephone Encounter (Signed)
lmom for prescreen  

## 2018-12-20 ENCOUNTER — Ambulatory Visit (INDEPENDENT_AMBULATORY_CARE_PROVIDER_SITE_OTHER): Payer: Medicare Other | Admitting: Pharmacist

## 2018-12-20 ENCOUNTER — Telehealth: Payer: Self-pay | Admitting: Cardiology

## 2018-12-20 DIAGNOSIS — I480 Paroxysmal atrial fibrillation: Secondary | ICD-10-CM | POA: Diagnosis not present

## 2018-12-20 DIAGNOSIS — Z7901 Long term (current) use of anticoagulants: Secondary | ICD-10-CM | POA: Diagnosis not present

## 2018-12-20 LAB — POCT INR: INR: 5.2 — AB (ref 2.0–3.0)

## 2018-12-20 NOTE — Telephone Encounter (Signed)
Follow up: ° ° ° °Patient returning your call. °

## 2018-12-20 NOTE — Telephone Encounter (Signed)

## 2018-12-21 ENCOUNTER — Telehealth: Payer: Self-pay | Admitting: *Deleted

## 2018-12-21 ENCOUNTER — Other Ambulatory Visit: Payer: Self-pay

## 2018-12-21 NOTE — Telephone Encounter (Signed)
Contacted patient, husband answered phone, patient sleeping, told husband we would call back tomorrow

## 2018-12-21 NOTE — Telephone Encounter (Signed)
Patient called to report that the medication adjustments made during last visit are making her feel worse.  Cymbalta was increased from 60 mg to 90 mg and sertraline was decreased from 100 mg to 50 mg.   She reports no energy, feeling off balance, and a little shaky.  Please advise

## 2018-12-21 NOTE — Telephone Encounter (Signed)
Let's go in a different direction and increase zoloft back to 100mg  daily and reduce cymbalta back to 60mg  for one week and then to 30mg  the next week. Have her call in 3 weeks and let us know how she's feeling. At that point we can consider a trial of another medication in place of the cymbalta which may help a bit more with energy.

## 2018-12-22 DIAGNOSIS — Z20828 Contact with and (suspected) exposure to other viral communicable diseases: Secondary | ICD-10-CM | POA: Diagnosis not present

## 2018-12-22 NOTE — Telephone Encounter (Signed)
Informed patient

## 2018-12-25 ENCOUNTER — Other Ambulatory Visit: Payer: Self-pay | Admitting: Physician Assistant

## 2018-12-25 ENCOUNTER — Other Ambulatory Visit: Payer: Self-pay | Admitting: Physical Medicine & Rehabilitation

## 2018-12-25 DIAGNOSIS — F329 Major depressive disorder, single episode, unspecified: Secondary | ICD-10-CM

## 2018-12-29 ENCOUNTER — Telehealth: Payer: Self-pay

## 2018-12-29 NOTE — Telephone Encounter (Signed)
1. Do you currently have a fever? NO (yes = cancel and refer to pcp for e-visit) 2. Have you recently travelled on a cruise, internationally, or to [place], [place], [place], [place], [place], or [place], [place] ([employer])? NO (yes = cancel, stay home, monitor symptoms, and contact pcp or initiate e-visit if symptoms develop) 3. Have you been in contact with someone that is currently pending confirmation of Covid19 testing or has been confirmed to have the Covid19 virus? NO (yes = cancel, stay home, away from tested individual, monitor symptoms, and contact pcp or initiate e-visit if symptoms develop) 4. Are you currently experiencing fatigue or cough? NO (yes = pt should be prepared to have a mask placed at the time of their visit).  Pt. Advised that we are restricting visitors at this time and anyone present in the vehicle should meet the above criteria as well. Advised that visit will be at curbside for finger stick ONLY and will receive call with instructions. Pt also advised to please bring own pen for signature of arrival document.  

## 2018-12-30 ENCOUNTER — Other Ambulatory Visit: Payer: Self-pay | Admitting: Physician Assistant

## 2019-01-02 ENCOUNTER — Telehealth: Payer: Self-pay | Admitting: Cardiology

## 2019-01-02 MED ORDER — DOFETILIDE 250 MCG PO CAPS: 250.0000 ug | ORAL_CAPSULE | Freq: Two times a day (BID) | ORAL | 0 refills | Status: DC

## 2019-01-02 NOTE — Telephone Encounter (Signed)
This is not a medicine that I prescribe - will forward to A fib Clnic  [name], MD

## 2019-01-02 NOTE — Telephone Encounter (Signed)
Reviewed pt's last office note and Tikosyn is not listed on active med list. Is pt to continue to take? Will forward to [name] for review .Adonis Housekeeper

## 2019-01-02 NOTE — Telephone Encounter (Signed)
Pt called to get a refill on her dofetilide (TIKOSYN) 250 MCG capsule.   Medication notes state that the patient needs to schedule an appt before a refill will be granted. The patient wanted to make sure that she is in fact still supposed to be taking this medication.

## 2019-01-02 NOTE — Telephone Encounter (Signed)
appt made for 6/4 - explained to patient importance of close monitoring of patients on tikosyn.

## 2019-01-03 DIAGNOSIS — R399 Unspecified symptoms and signs involving the genitourinary system: Secondary | ICD-10-CM | POA: Diagnosis not present

## 2019-01-08 ENCOUNTER — Other Ambulatory Visit: Payer: Self-pay | Admitting: Cardiology

## 2019-01-11 MED ORDER — BUPROPION HCL 75 MG PO TABS: 75.0000 mg | ORAL_TABLET | ORAL | 3 refills | Status: DC

## 2019-01-11 NOTE — Telephone Encounter (Signed)
It is 3 weeks later and patient calls back to give feedback on regiment. She states she still feels bad and has no energy. Legs hurt so bad can't hardly stand and walk.

## 2019-01-11 NOTE — Telephone Encounter (Signed)
Dc cymbalta, wellbutrin sent into pharmacy

## 2019-01-11 NOTE — Addendum Note (Signed)
Addended by: Alger Simons T on: 01/11/2019 04:50 PM   Modules accepted: Orders

## 2019-01-12 NOTE — Telephone Encounter (Signed)
Tried calling patient but said person is not available.

## 2019-01-13 ENCOUNTER — Ambulatory Visit (INDEPENDENT_AMBULATORY_CARE_PROVIDER_SITE_OTHER): Payer: Medicare Other | Admitting: Pharmacist

## 2019-01-13 ENCOUNTER — Other Ambulatory Visit: Payer: Self-pay

## 2019-01-13 DIAGNOSIS — Z7901 Long term (current) use of anticoagulants: Secondary | ICD-10-CM | POA: Diagnosis not present

## 2019-01-13 DIAGNOSIS — I480 Paroxysmal atrial fibrillation: Secondary | ICD-10-CM

## 2019-01-13 LAB — POCT INR: INR: 3 (ref 2.0–3.0)

## 2019-01-16 NOTE — Telephone Encounter (Signed)
Patient called back and notified. She is asking if she needs to on the Zoloft and what mg 100 or 50? Also she is concerned about Wellbutrin making her have a rapid heartbeat which she already takes a medication for. Please advise.

## 2019-01-16 NOTE — Telephone Encounter (Signed)
      Signed         Let's go in a different direction and increase zoloft back to 100mg  daily and reduce cymbalta back to 60mg  for one week and then to 30mg  the next week. Have her call in 3 weeks and let us know how she's feeling. At that point we can consider a trial of another medication in place of the cymbalta which may help a bit more with energy.         Electronically signed by Meredith Staggers, MD at 12/21/2018 10:33 AM     Telephone on 12/21/2018       Detailed Report     Has she had a problem with wellbutrin before?  It's no more likely to cause tachycardia then cymbalta.

## 2019-01-16 NOTE — Telephone Encounter (Signed)
Patient would like for you to call her °

## 2019-01-18 ENCOUNTER — Telehealth: Payer: Self-pay | Admitting: *Deleted

## 2019-01-18 NOTE — Telephone Encounter (Signed)
Katherine [name] called asking what dose of Zoloft she should be taking. By Dr Naaman Plummer last response, he wanted her to take 100 mg Zoloft and drop down to 30 of cymbalta, which has now been switched to Welbutrin. I let her know his note said Zoloft 100 mg.   Also, she is in a great deal of pain in her lower back which radiates to her feet bilaterally.  She is requesting an ESI.  Her husband has cancer and she just lost her sister and she is under a lot of stress because of these facts. I let her know Dr Naaman Plummer is out of the office/hospital and would return Monday. Please advise.

## 2019-01-19 ENCOUNTER — Ambulatory Visit (HOSPITAL_COMMUNITY): Payer: Medicare Other | Admitting: Physician Assistant

## 2019-01-23 NOTE — Telephone Encounter (Signed)
Sent to [place] to schedule.

## 2019-01-23 NOTE — Telephone Encounter (Signed)
I spoke to Katherine [name]. Please set up for left Trans-LESI at L4-5 per Dr. Letta Pate.  Pt states she's having back with associated radiating Left>right leg pain which has been gradually worsening.   thx

## 2019-01-25 ENCOUNTER — Other Ambulatory Visit: Payer: Self-pay | Admitting: Emergency Medicine

## 2019-01-26 ENCOUNTER — Other Ambulatory Visit: Payer: Self-pay | Admitting: Nurse Practitioner

## 2019-01-26 ENCOUNTER — Encounter: Payer: Self-pay | Admitting: Internal Medicine

## 2019-01-26 ENCOUNTER — Telehealth: Payer: Self-pay | Admitting: Primary Care

## 2019-01-26 ENCOUNTER — Telehealth (HOSPITAL_COMMUNITY): Payer: Self-pay | Admitting: *Deleted

## 2019-01-26 ENCOUNTER — Ambulatory Visit (INDEPENDENT_AMBULATORY_CARE_PROVIDER_SITE_OTHER): Payer: Medicare Other | Admitting: Internal Medicine

## 2019-01-26 DIAGNOSIS — F4321 Adjustment disorder with depressed mood: Secondary | ICD-10-CM | POA: Diagnosis not present

## 2019-01-26 NOTE — Progress Notes (Signed)
Virtual Visit via Audio-only Note  I connected with Katherine [name] on 01/26/19 at  3:20 PM EDT by an audio enabled telemedicine application and verified that I am speaking with the correct person using two identifiers.  The patient and the provider were at separate locations throughout the entire encounter.   I discussed the limitations of evaluation and management by telemedicine and the availability of in person appointments. The patient expressed understanding and agreed to proceed.  History of Present Illness: The patient is a 63 y.o. female with visit for major concerns including lack of sleep and grief. Her husband has been sick with cancer for many years and has been declining rapidly. He has gone to [facility] today although he wanted to stay at home as she cannot care for him. She has many health problems herself. Not sleeping in some time. Is beside herself with grief. Is not sure how she will stay in her current housing once he dies her check alone will not be enough to pay bills. Started recently. Has a niece but she is having her own life and cannot come all the time to help. Her parents are deceased. She also had a sibling die abruptly around [place] day so she is coping with that as well. Denies SI/HI. Overall it is severe. Has tried taking her promethazine and oxycodone and lorazepam and it is not helping. The social worker from [facility] calls twice during our conversation.  Of note she is taking promethazine for nausea and oxycodone. After several questioning she does admit to taking lorazepam once a day but since she has been taking this for years it does not do anything to help her and she discounts even taking this.   Observations/Objective: Hysterical at times, calm toward the end of our conversation, voice strong, no coughing, A and O times 3  Assessment and Plan: See problem oriented charting  Follow Up Instructions: ramelteon for sleep  Visit time 25 minutes: that  time was spent in face to face counseling and coordination of care with the patient: counseled about acute grief as above  I discussed the assessment and treatment plan with the patient. The patient was provided an opportunity to ask questions and all were answered. The patient agreed with the plan and demonstrated an understanding of the instructions.   The patient was advised to call back or seek an in-person evaluation if the symptoms worsen or if the condition fails to improve as anticipated.  Hoyt Koch, MD

## 2019-01-26 NOTE — Telephone Encounter (Signed)
Pt had previously cancelled her appt for 6/4 tikosyn follow up due to her husbands placement on home hospice.  Pt refill request came in today for her Tikosyn.  I sent 30 day supply.  I called the patient to discuss her scheduling a new appt.  Pt declined to do so at this time as her husband is going to [facility] today.  I gave my condolences.  Let pt know that Tikosyn needs to be closely monitored and to please call to make appt soon.

## 2019-01-26 NOTE — Telephone Encounter (Signed)
Returned call to patient who is very emotional as her husbands health is failing. She says someone called her earlier and she could not talk because he was being transitioned to hospice. There is not a note in chart. Made pt aware appt with RB in July.

## 2019-01-27 ENCOUNTER — Telehealth: Payer: Self-pay | Admitting: Internal Medicine

## 2019-01-27 ENCOUNTER — Other Ambulatory Visit: Payer: Self-pay | Admitting: Internal Medicine

## 2019-01-27 DIAGNOSIS — F4321 Adjustment disorder with depressed mood: Secondary | ICD-10-CM | POA: Insufficient documentation

## 2019-01-27 MED ORDER — RAMELTEON 8 MG PO TABS: 8.0000 mg | ORAL_TABLET | Freq: Every day | ORAL | 3 refills | Status: DC

## 2019-01-27 NOTE — Telephone Encounter (Signed)
Copied from [place] [id number]. Topic: General - Other >> Jan 27, 2019  4:02 PM Leward Quan A wrote: Reason for CRM: Patient called to say that Rx that was prescribed to help her sleep was too expensive. She say that the price was almost $300 and she does not have that kind of money at this time. She is asking if something else could possibly sent to the pharmacy. Please advise Ph# [phone]

## 2019-01-27 NOTE — Assessment & Plan Note (Addendum)
Due to impending loss of spouse. Rx for ramelteon to see if this helps with sleep. Given she is already on chronic benzos and oxycodone as well as mixing with phenergan will not add another controlled substance. She has mild thoughts of not wanting to be around during our conversation but denies any thoughts of self harm or SI. Strongly encouraged her to reach out to the [facility] social worker as they have counseling available to her and she needs to seek this immediately.

## 2019-01-27 NOTE — Telephone Encounter (Signed)
Routing to dr crawford, medication too expensive is ramelteon---please advise, thanks

## 2019-01-29 ENCOUNTER — Encounter (HOSPITAL_BASED_OUTPATIENT_CLINIC_OR_DEPARTMENT_OTHER): Payer: Medicare Other

## 2019-01-30 ENCOUNTER — Telehealth: Payer: Self-pay

## 2019-01-30 ENCOUNTER — Other Ambulatory Visit: Payer: Self-pay | Admitting: Internal Medicine

## 2019-01-30 NOTE — Telephone Encounter (Signed)
1. COVID-19 Pre-Screening Questions:  . In the past 7 to 10 days have you had a cough,  shortness of breath, headache, congestion, fever (100 or greater) body aches, chills, sore throat, or sudden loss of taste or sense of smell?  no . Have you been around anyone with known Covid 19.  no . Have you been around anyone who is awaiting Covid 19 test results in the past 7 to 10 days?  no . Have you been around anyone who has been exposed to Covid 19, or has mentioned symptoms of Covid 19 within the past 7 to 10 days?  no    2. Pt advised of visitor restrictions (no visitors allowed except if needed to conduct the visit). Also advised to arrive at appointment time and wear a mask.     

## 2019-01-30 NOTE — Telephone Encounter (Signed)
lvm advising patient of dr crawfords note/instructions, also suggested looking up natural remedies to promote sleep, such as aroma therapies, etc., call back if any further questions

## 2019-01-30 NOTE — Telephone Encounter (Signed)
Routing to dr jones----patient states she doesn't think melatonin OTC or other natural remedies is going to help---she was crying on the phone saying she just needs something to make her sleep---I have offered virutal visit with another provider today, patient declines---I have advised that dr Ronnald Ramp is out of office today, but I will route message over to him to see if he can give any other recommendations, we will call patient back with his advisement---please advise, I will call patient back, thanks

## 2019-01-30 NOTE — Telephone Encounter (Signed)
She can try melatonin over the counter as this is very similar. Given her pain medication and lorazepam there are limited safe options for her sleep.

## 2019-02-02 ENCOUNTER — Ambulatory Visit (INDEPENDENT_AMBULATORY_CARE_PROVIDER_SITE_OTHER): Payer: Medicare Other | Admitting: Pharmacist Clinician (PhC)/ Clinical Pharmacy Specialist

## 2019-02-02 ENCOUNTER — Other Ambulatory Visit: Payer: Self-pay

## 2019-02-02 ENCOUNTER — Encounter: Payer: Medicare Other | Admitting: Physical Medicine & Rehabilitation

## 2019-02-02 DIAGNOSIS — Z7901 Long term (current) use of anticoagulants: Secondary | ICD-10-CM

## 2019-02-02 DIAGNOSIS — I480 Paroxysmal atrial fibrillation: Secondary | ICD-10-CM | POA: Diagnosis not present

## 2019-02-02 LAB — POCT INR: INR: 2 (ref 2.0–3.0)

## 2019-02-02 NOTE — Progress Notes (Unsigned)
  PROCEDURE RECORD [facility]   Name: Katherine [name] DOB:[birth year] MRN: [id number]  Date:02/02/2019  Physician: {CPRM-PROVIDERS:[id number]}    Nurse/CMA: ***  Allergies:  Allergies  Allergen Reactions  . Tetracycline Hives  . Hydromorphone Hcl Nausea And Vomiting  . Niacin Other (See Comments)    Mouth blisters  . Niaspan [Niacin Er] Other (See Comments)    Mouth blisters  . Sulfa Antibiotics Nausea Only and Other (See Comments)    "Tears up stomach"  . Sulfonamide Derivatives Other (See Comments)    Reaction: per patient "tears her stomach up"  . Codeine Nausea And Vomiting  . Erythromycin Nausea And Vomiting  . Hydromorphone Hcl Nausea And Vomiting  . Morphine And Related Nausea And Vomiting  . Nalbuphine Nausea And Vomiting  . Sulfasalazine Nausea Only and Other (See Comments)    per patient "tears her stomach up", "Tears up stomach"  . Tape Rash and Other (See Comments)    No "plastic" tape," please    Consent Signed: {yes TK:244695}  Is patient diabetic? {yes no:314532}  CBG today? ***  Pregnant: {yes no:314532} LMP: No LMP recorded. Patient is postmenopausal. (age 16-55)  Anticoagulants: {Yes/No:19989} Anti-inflammatory: {Yes/No:19989} Antibiotics: {Yes/No:19989}  Procedure: ***  Position: {PRONE/SUPINE/SITTING/LATERAL:21022916} Start Time: ***  End Time: ***  Fluoro Time: ***  RN/CMA *** ***    Time *** ***    BP *** ***    Pulse *** ***    Respirations *** ***    O2 Sat *** ***    S/S *** ***    Pain Level *** ***     D/C home with ***, patient A & O X 3, D/C instructions reviewed, and sits independently.

## 2019-02-03 ENCOUNTER — Telehealth: Payer: Self-pay | Admitting: *Deleted

## 2019-02-03 NOTE — Telephone Encounter (Signed)
Mrs Naim called and states that her cardiologist needs to speak with Dr Letta Pate about her stopping the coumadin. His name is [name] with [facility] location.

## 2019-02-04 ENCOUNTER — Other Ambulatory Visit: Payer: Self-pay | Admitting: Physical Medicine & Rehabilitation

## 2019-02-06 DIAGNOSIS — N2581 Secondary hyperparathyroidism of renal origin: Secondary | ICD-10-CM | POA: Diagnosis not present

## 2019-02-06 DIAGNOSIS — E785 Hyperlipidemia, unspecified: Secondary | ICD-10-CM | POA: Diagnosis not present

## 2019-02-06 DIAGNOSIS — D631 Anemia in chronic kidney disease: Secondary | ICD-10-CM | POA: Diagnosis not present

## 2019-02-06 DIAGNOSIS — E118 Type 2 diabetes mellitus with unspecified complications: Secondary | ICD-10-CM | POA: Diagnosis not present

## 2019-02-06 DIAGNOSIS — N182 Chronic kidney disease, stage 2 (mild): Secondary | ICD-10-CM | POA: Diagnosis not present

## 2019-02-06 DIAGNOSIS — J449 Chronic obstructive pulmonary disease, unspecified: Secondary | ICD-10-CM | POA: Diagnosis not present

## 2019-02-06 DIAGNOSIS — Z940 Kidney transplant status: Secondary | ICD-10-CM | POA: Diagnosis not present

## 2019-02-06 DIAGNOSIS — I639 Cerebral infarction, unspecified: Secondary | ICD-10-CM | POA: Diagnosis not present

## 2019-02-06 DIAGNOSIS — I2510 Atherosclerotic heart disease of native coronary artery without angina pectoris: Secondary | ICD-10-CM | POA: Diagnosis not present

## 2019-02-06 DIAGNOSIS — I739 Peripheral vascular disease, unspecified: Secondary | ICD-10-CM | POA: Diagnosis not present

## 2019-02-06 DIAGNOSIS — I129 Hypertensive chronic kidney disease with stage 1 through stage 4 chronic kidney disease, or unspecified chronic kidney disease: Secondary | ICD-10-CM | POA: Diagnosis not present

## 2019-02-06 DIAGNOSIS — I4891 Unspecified atrial fibrillation: Secondary | ICD-10-CM | POA: Diagnosis not present

## 2019-02-06 NOTE — Telephone Encounter (Addendum)
I have given this information to Mrs Collings and she has verbalized understanding.  ----- Message from Charlett Blake, MD sent at 02/05/2019  6:05 AM EDT ----- Please instruct pt to stop warfarin for 1 wk prior to inj and check PT/INR within 24 h of inj ----- Message ----- From: [name], MD Sent: 02/04/2019   4:37 PM EDT To: Charlett Blake, MD  Yes.  DH ----- Message ----- From: Charlett Blake, MD Sent: 02/03/2019   3:59 PM EDT To: [name], MD  Shanon Brow  Is this pt ok to come off warfarin for ~1wk to have an epidural steroid injection?  THanks  [employer]

## 2019-02-11 IMAGING — DX DG HAND COMPLETE 3+V*R*
3 series · 3 of 3 positions shown · non-contrast
Comparison: None.

CLINICAL DATA: Thumb wound.

EXAM:
RIGHT HAND - COMPLETE 3+ VIEW

[hand pa]
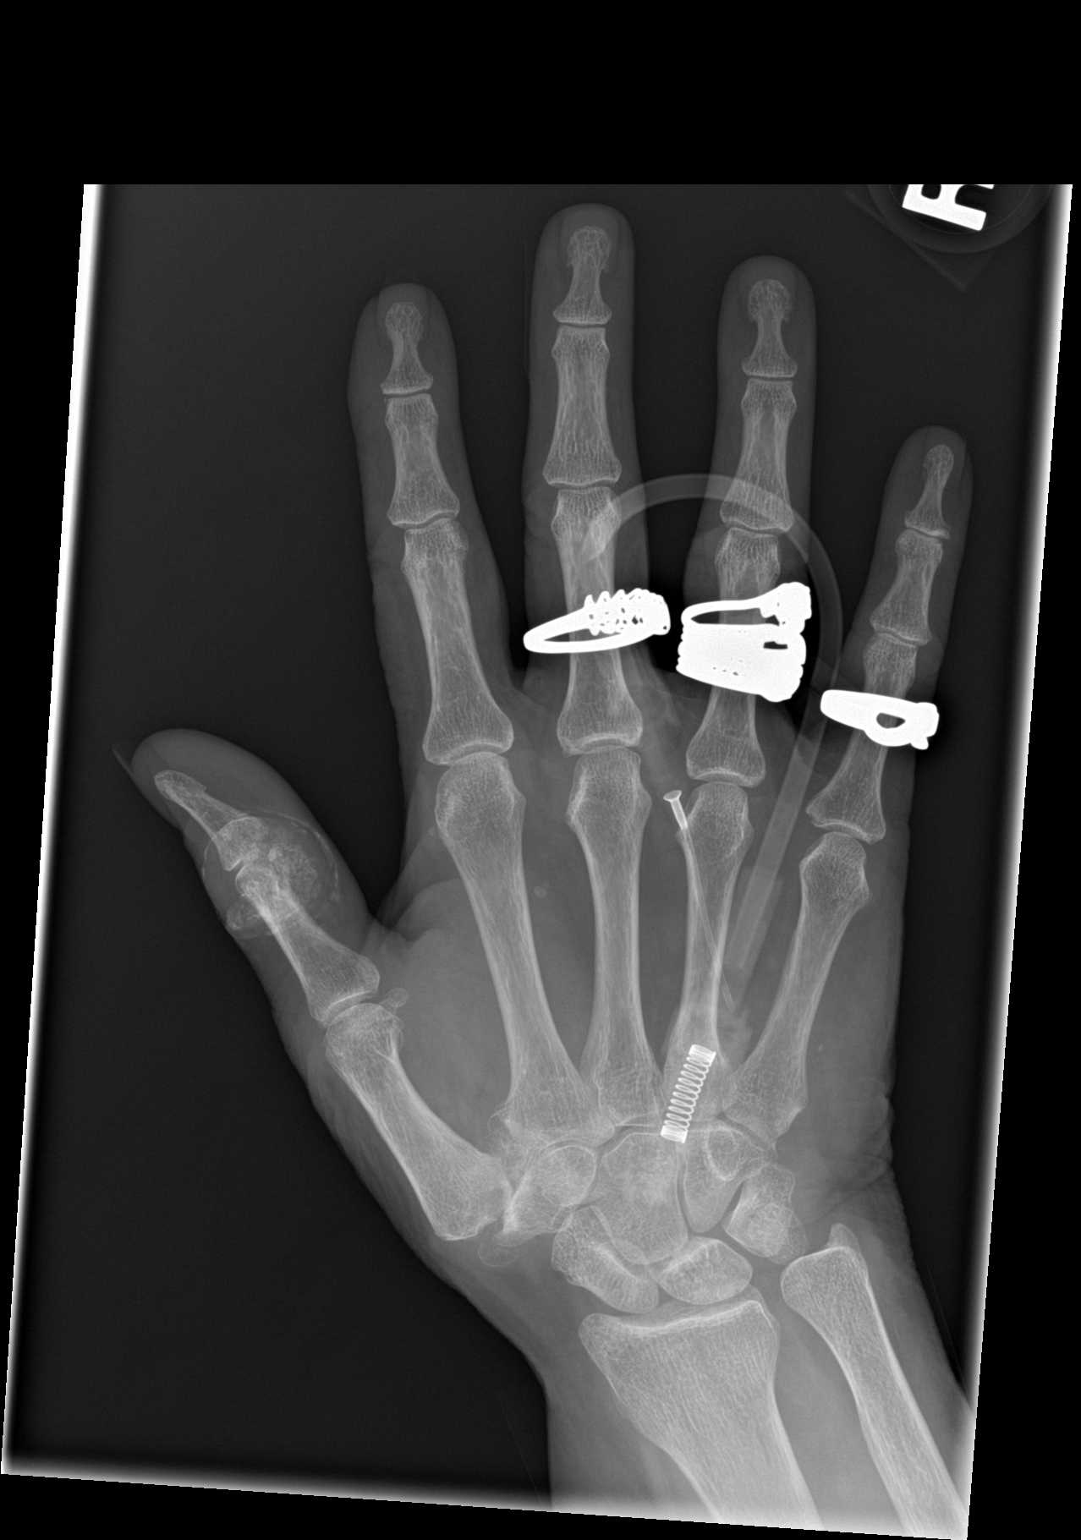

[hand obl]
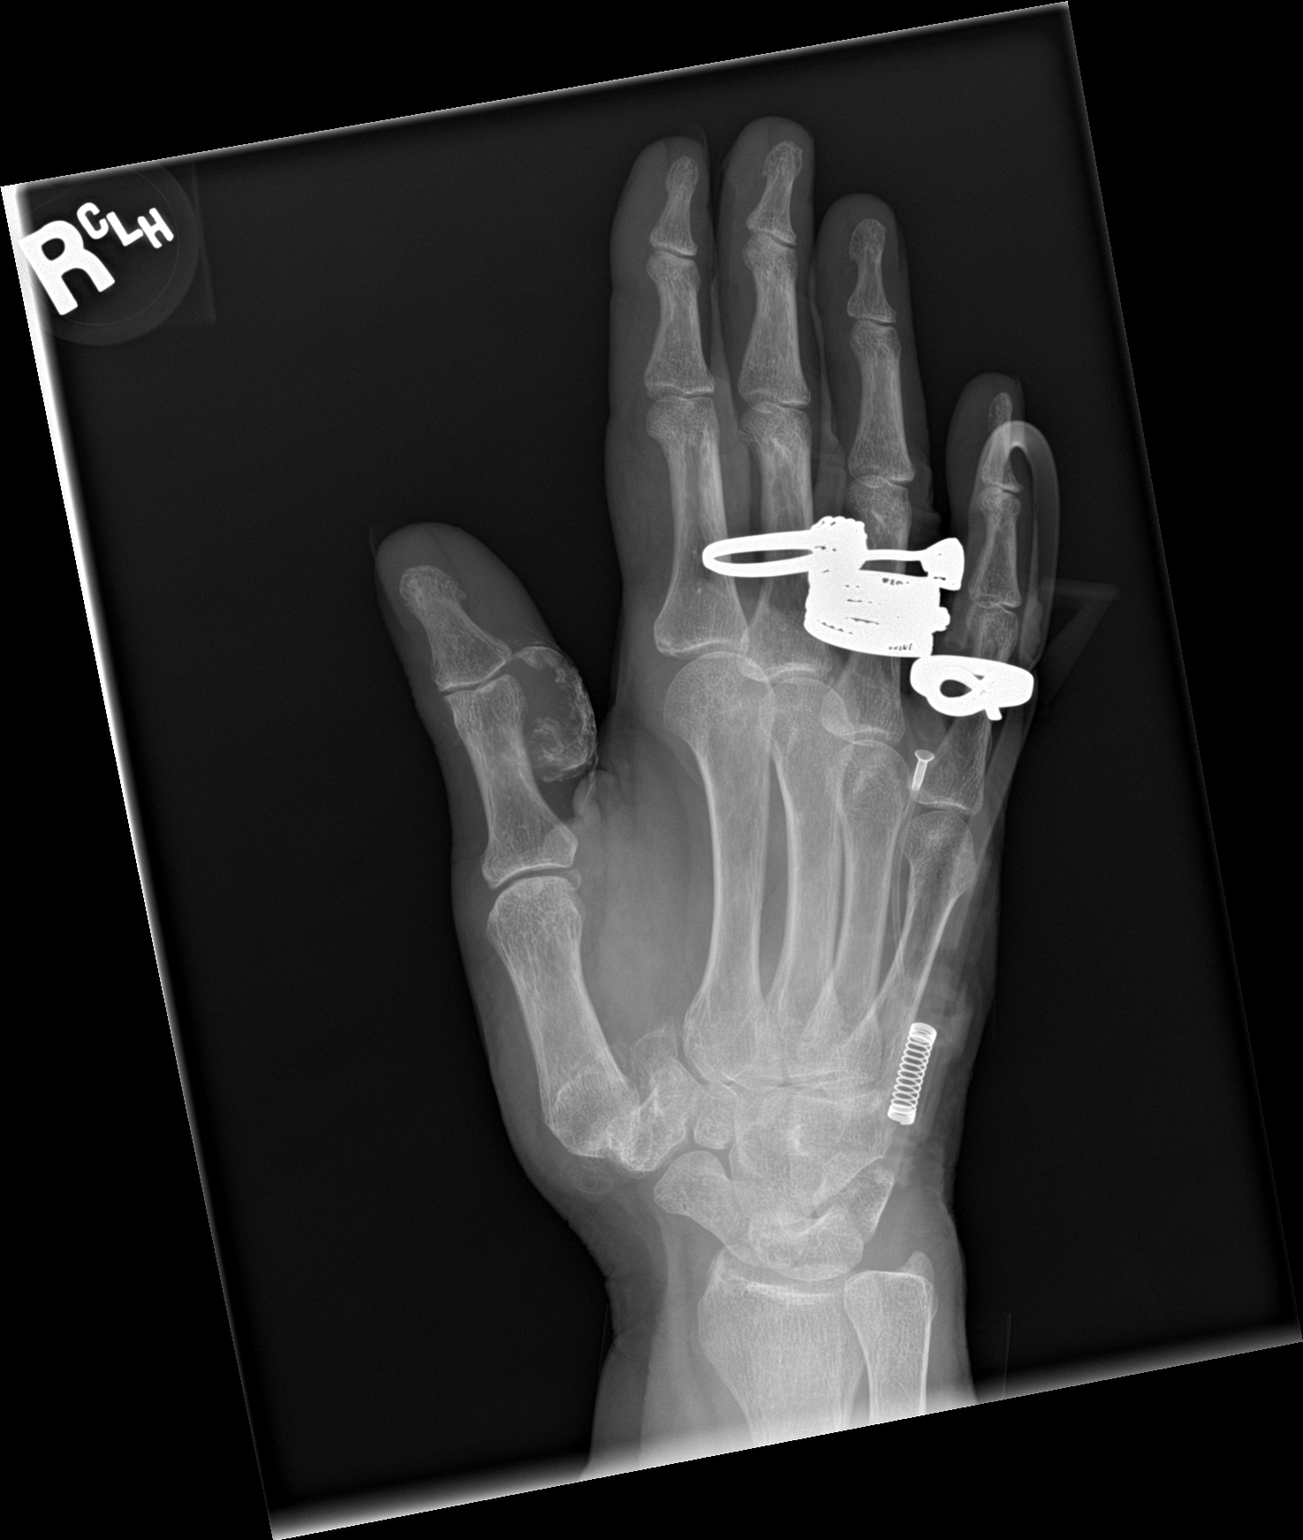

[hand lat]
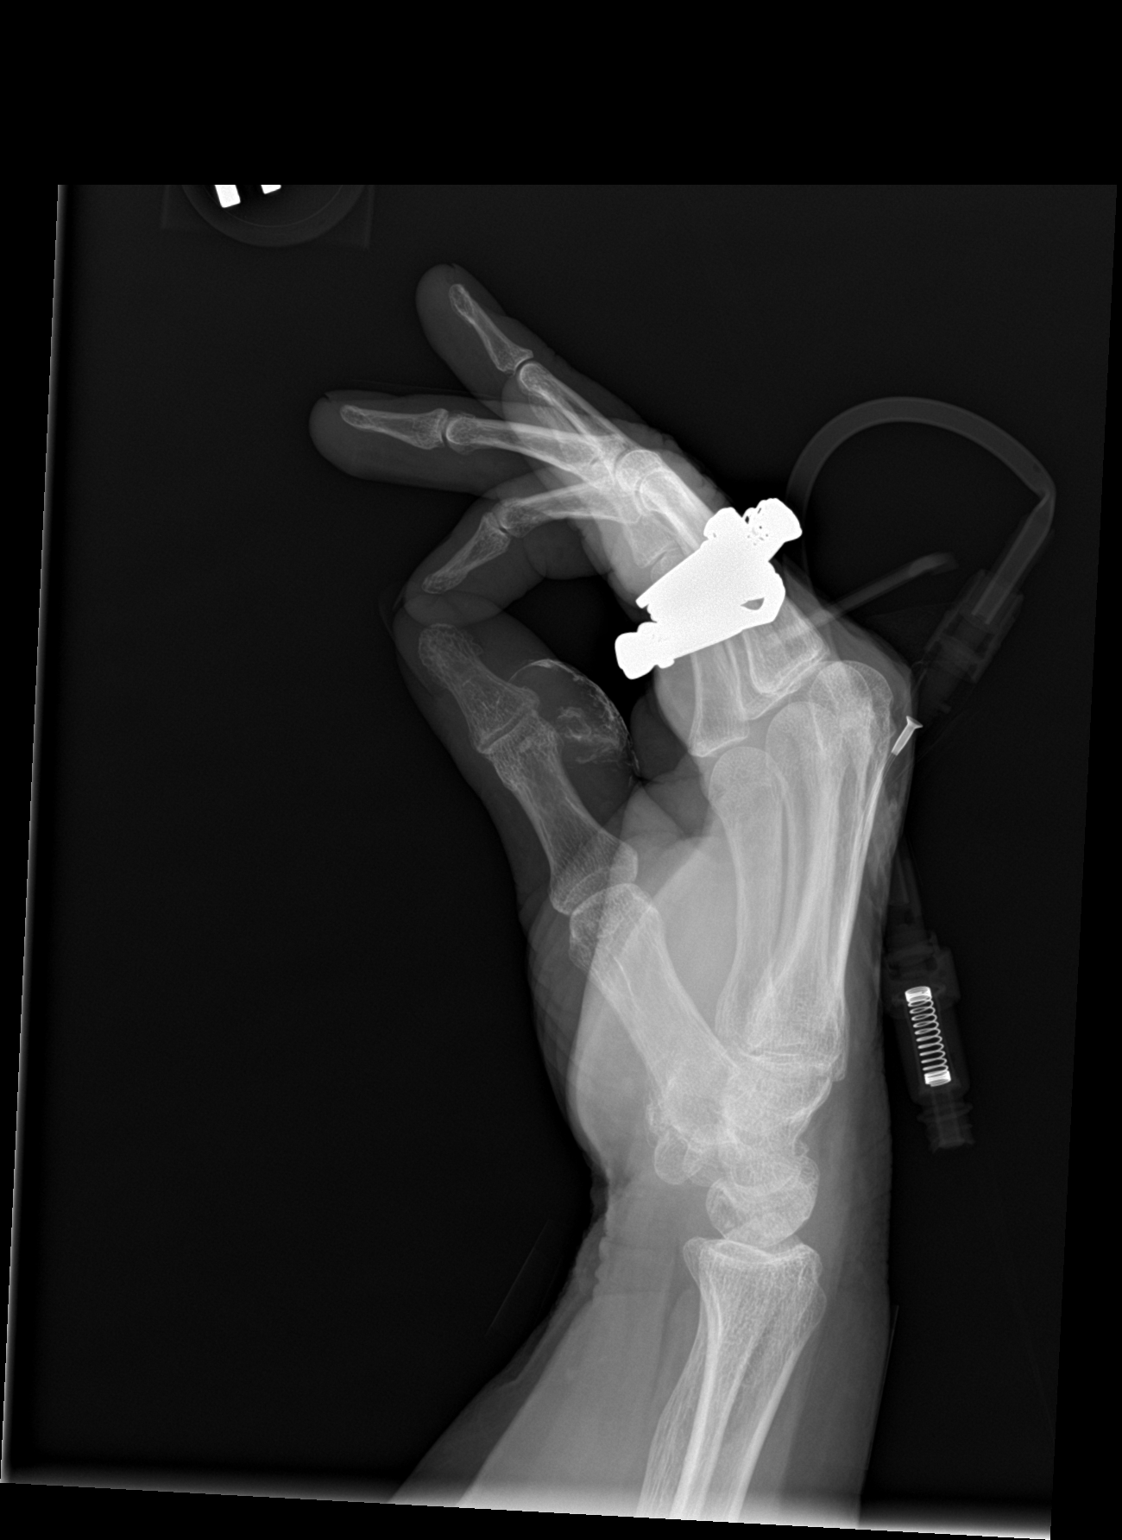

[3 of 3 positions shown; findings below may reference images not displayed]

FINDINGS: Round, partially calcified 2.2 cm lesion along the ulnar aspect of
the first proximal phalanx and IP joint. This does not clearly
involve the underlying bone. Moderate osteoarthritis of the first
CMC joint with mild subluxation. Remaining joint spaces are
preserved. Diffuse osteopenia.
IMPRESSION: 1. Round, partially calcified 2.2 cm lesion along the ulnar aspect
of the first IP joint without definite involvement of the underlying
bone. This is nonspecific, but could reflect chronic indolent
granulomatous infection. Recommend non-emergent MRI with and without
contrast to better characterize and evaluate for a discrete mass or
underlying bony involvement.
2. Moderate first CMC joint osteoarthritis.

## 2019-02-11 IMAGING — DX DG CHEST 2V
2 series · 2 of 2 positions shown · non-contrast
Comparison: Very 02/03/2018

CLINICAL DATA: Chest pain

EXAM:
CHEST - 2 VIEW

[chest lat]
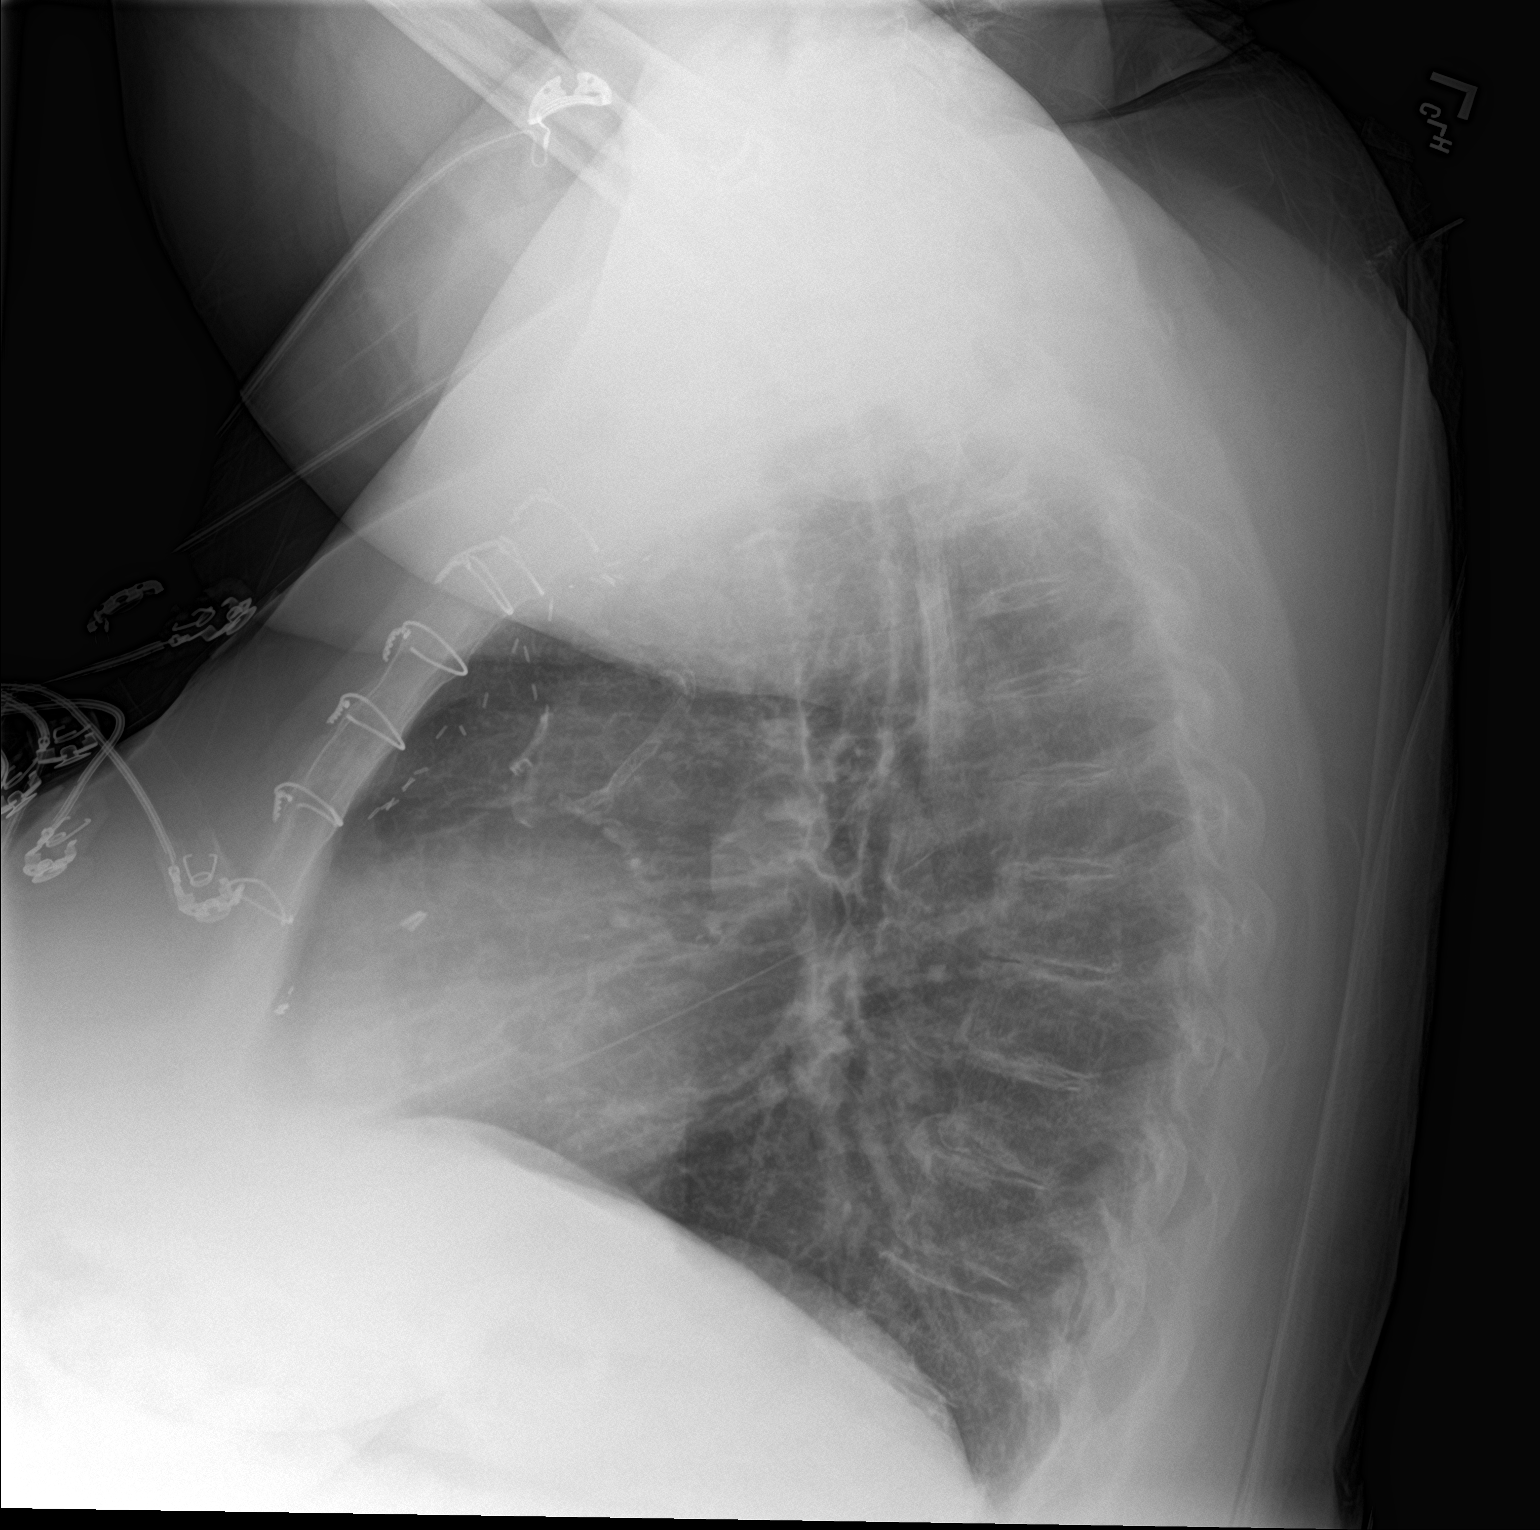

[chest ap]
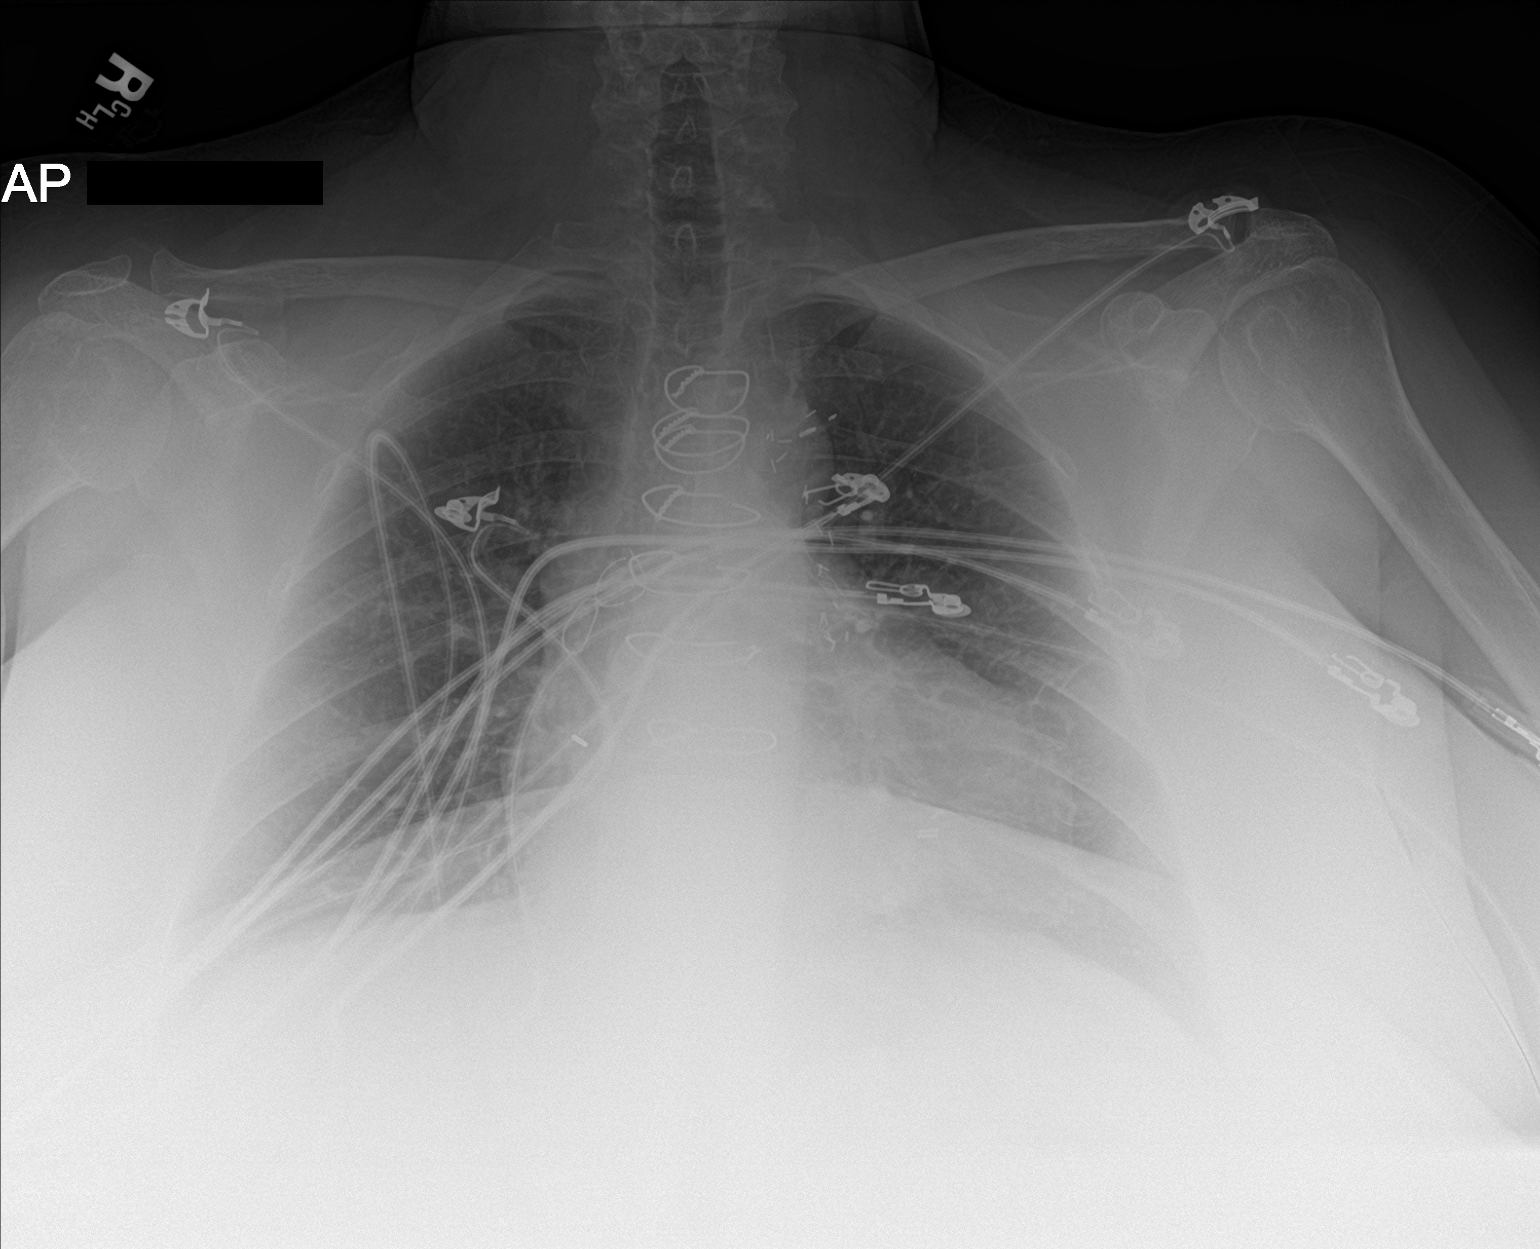

[2 of 2 positions shown; findings below may reference images not displayed]

FINDINGS: No edema or consolidation. Heart is upper normal in size with
pulmonary vascularity normal. Patient is status post coronary artery
bypass grafting. No adenopathy. No bone lesions.
IMPRESSION: Status post coronary artery bypass grafting. No edema or
consolidation. Heart upper normal in size.

## 2019-02-14 ENCOUNTER — Telehealth: Payer: Self-pay

## 2019-02-14 NOTE — Telephone Encounter (Signed)
Pt has been notified. Pt has been taking 2 Oxycodone a day instead of one.

## 2019-02-14 NOTE — Telephone Encounter (Signed)
Then we will probably have to stay with cymbalta. Not too many other truly different options to offer her.

## 2019-02-14 NOTE — Telephone Encounter (Signed)
Patient called and stated has had a bad reaction to the new medication of Wellbutrin and because of that she had to go back on to her Cymbalta.  Stated that the side effect was severe diarrhea.

## 2019-02-16 ENCOUNTER — Encounter: Payer: Self-pay | Admitting: Emergency Medicine

## 2019-02-16 ENCOUNTER — Other Ambulatory Visit: Payer: Self-pay

## 2019-02-16 ENCOUNTER — Ambulatory Visit (INDEPENDENT_AMBULATORY_CARE_PROVIDER_SITE_OTHER): Payer: Medicare Other | Admitting: Internal Medicine

## 2019-02-16 ENCOUNTER — Telehealth: Payer: Self-pay

## 2019-02-16 ENCOUNTER — Ambulatory Visit (INDEPENDENT_AMBULATORY_CARE_PROVIDER_SITE_OTHER): Payer: Medicare Other | Admitting: Emergency Medicine

## 2019-02-16 DIAGNOSIS — J309 Allergic rhinitis, unspecified: Secondary | ICD-10-CM | POA: Diagnosis not present

## 2019-02-16 DIAGNOSIS — K219 Gastro-esophageal reflux disease without esophagitis: Secondary | ICD-10-CM

## 2019-02-16 DIAGNOSIS — F333 Major depressive disorder, recurrent, severe with psychotic symptoms: Secondary | ICD-10-CM | POA: Insufficient documentation

## 2019-02-16 DIAGNOSIS — J4520 Mild intermittent asthma, uncomplicated: Secondary | ICD-10-CM | POA: Diagnosis not present

## 2019-02-16 MED ORDER — VIIBRYD STARTER PACK 10 & 20 MG PO KIT: 1.0000 | PACK | Freq: Every day | ORAL | 0 refills | Status: DC

## 2019-02-16 MED ORDER — ARIPIPRAZOLE 2 MG PO TABS: 2.0000 mg | ORAL_TABLET | Freq: Every day | ORAL | 0 refills | Status: DC

## 2019-02-16 NOTE — Progress Notes (Signed)
Virtual Visit via Telephone Note  I connected with Katherine [name] on 02/16/19 at  2:30 PM EDT by telephone and verified that I am speaking with the correct person using two identifiers.  Location: Patient: Home Provider: Office   I discussed the limitations, risks, security and privacy concerns of performing an evaluation and management service by telephone and the availability of in person appointments. I also discussed with the patient that there may be a patient responsible charge related to this service. The patient expressed understanding and agreed to proceed.   History of Present Illness: 63 year old woman with a history of renal transplantation on chronic Imuran and prednisone 5 mg, restrictive lung disease due to obesity, prior lap band surgery with subsequent release, chronic cough in the setting of mild obstructive disease, GERD, allergic rhinitis as well.   Observations/Objective: Patient has been treated for upper respiratory symptoms twice since my last visit with her.  In March she had strep throat, treated with penicillin.  Again in April when she was treated for an acute flare with Augmentin, an increase in prednisone for 1 week and then back down her usual 5 mg daily.  Finally treated again in early May with prednisone 20 mg for 1 week and then back down to 5.  In addition to prednisone 5 mg, she is currently on Symbicort 2 puffs twice a day, Pepcid once daily, Flonase once daily, Mucinex 600 twice daily, Singulair 10 mg daily.  She has albuterol HFA and nebulizer available, uses approximately  She has been pretty stable since last flare, . Unfortunately since last time her husband and sister have passed away - she is having a hard time with this, is starting abilify, viibryd per Dr Ronnald Ramp. She is having some nocturnal cough. Overall her cough is better since her family started cleaning her cat box for her. She is planning for epidural for back and LE pain on 7/13, will have  to hold coumadin. Her reflux seems to be stable. Using albuterol about once a day, sometimes at night when she wakes up   Assessment and Plan: Extrinsic asthma Several flares this spring probably with onset of increased allergies, now stabilizing.  She is back on her usual prednisone.  Her usual regimen is Symbicort, albuterol as needed, using approximately once a day.  Continue these and plan to follow-up in 3 months.  Chronic allergic rhinitis Continue Singulair, Flonase nasal spray, Mucinex.  We talked about avoiding exposures including changing her cat litter which she has noticed is problematic.  Asked her to use a mask if she had be exposed.  Also asked her to consider adding nasal saline to her regimen when her exposure profile increases.  GERD States that this is well controlled for now on her current Pepcid.  This is a factor that exacerbates her upper airway irritation and asthma.  Continue same.    Follow Up Instructions:    I discussed the assessment and treatment plan with the patient. The patient was provided an opportunity to ask questions and all were answered. The patient agreed with the plan and demonstrated an understanding of the instructions.   The patient was advised to call back or seek an in-person evaluation if the symptoms worsen or if the condition fails to improve as anticipated.  I provided 14 minutes of non-face-to-face time during this encounter.   Collene Gobble, MD

## 2019-02-16 NOTE — Telephone Encounter (Signed)
Pt did a virtual visit today with PCP:   The following are patients instructions - pt has been informed of below:   Taper off of the sertraline Start Abilify today - this has been sent to the pharmacy.  Stay on Cymbalta.  Stop Wellbutrin  Start Viibryd at the following doses:  10mg  once a day for 1 week, then 20mg  once a day for 1 week.   Call us and let us know if she wants to stay at the 20 mg dose or increase to 40 mg.

## 2019-02-16 NOTE — Progress Notes (Signed)
Virtual Visit via Video Note  I connected with Katherine [name] on 02/16/19 at  9:00 AM EDT by a video enabled telemedicine application and verified that I am speaking with the correct person using two identifiers.   I discussed the limitations of evaluation and management by telemedicine and the availability of in person appointments. The patient expressed understanding and agreed to proceed.  History of Present Illness: She checked in for a virtual visit.  She was not willing to come in due to the COVID-19 pandemic.  Her husband died from lung cancer a few weeks ago.  She lives alone now but tells me she has good support with friends and family.  She tells me her depression is worsening.  She is taking Ativan as needed and sertraline 50 mg a day.  She complains of insomnia, feeling hopeless and helpless, ruminations, anxiety, and panic.  She denies SI or HI.  She tells me her weight and appetite are normal.    Observations/Objective: Tearful, distressed, distraught female.  She was intermittently crying. She was otherwise calm, cooperative and appropriate.  Her physical appearance was normal and at her baseline.   Assessment and Plan: She has worsening depression with some concern for psychosis.  She is not doing very well on the combination of Ativan and sertraline so I have asked her to taper off sertraline over the next few weeks and to start taking Abilify and Viibryd.  Asked her to stay on the current dose of Cymbalta.  She will start with Viibryd samples, taking 10 mg a day with food for 1 week, then taking 20 mg a day with food for another week.  After the 2-week trial I will consider whether not she stays at 20 mg or escalates to 40 mg a day.  I sent in a prescription for Abilify 2 mg at bedtime.  Will consider increasing the dose of this over time as well.  Lab Results  Component Value Date   WBC 8.1 10/21/2018   HGB 12.1 10/21/2018   HCT 36.2 10/21/2018   PLT 261.0 10/21/2018   GLUCOSE 96 10/21/2018   CHOL 111 11/30/2017   TRIG 129.0 11/30/2017   HDL 53.90 11/30/2017   LDLCALC 32 11/30/2017   ALT 17 02/03/2018   AST 24 02/03/2018   NA 134 (L) 10/21/2018   K 4.4 10/21/2018   CL 100 10/21/2018   CREATININE 0.89 10/21/2018   BUN 18 10/21/2018   CO2 24 10/21/2018   TSH 3.118 04/20/2018   INR 2.0 02/02/2019   HGBA1C 7.0 (H) 07/04/2018   MICROALBUR 30 11/30/2017     Follow Up Instructions:-She agrees to the above recommendations regarding the treatment of depression.  She will continue to use her family and friends support.  She will let me know of any SI or HI or any new or worsening symptoms.    I discussed the assessment and treatment plan with the patient. The patient was provided an opportunity to ask questions and all were answered. The patient agreed with the plan and demonstrated an understanding of the instructions.   The patient was advised to call back or seek an in-person evaluation if the symptoms worsen or if the condition fails to improve as anticipated.  I provided 30 minutes of non-face-to-face time during this encounter.   Scarlette Calico, MD

## 2019-02-16 NOTE — Assessment & Plan Note (Signed)
Several flares this spring probably with onset of increased allergies, now stabilizing.  She is back on her usual prednisone.  Her usual regimen is Symbicort, albuterol as needed, using approximately once a day.  Continue these and plan to follow-up in 3 months.

## 2019-02-16 NOTE — Assessment & Plan Note (Signed)
States that this is well controlled for now on her current Pepcid.  This is a factor that exacerbates her upper airway irritation and asthma.  Continue same.

## 2019-02-16 NOTE — Assessment & Plan Note (Addendum)
Continue Singulair, Flonase nasal spray, Mucinex.  We talked about avoiding exposures including changing her cat litter which she has noticed is problematic.  Asked her to use a mask if she had be exposed.  Also asked her to consider adding nasal saline to her regimen when her exposure profile increases.

## 2019-02-17 IMAGING — DX DG CHEST 2V
2 series · 2 of 2 positions shown · non-contrast
Comparison: 01/28/2018 chest radiograph.

CLINICAL DATA: 61 y/o  F; weakness for the last couple of days.

EXAM:
CHEST - 2 VIEW

[chest lat]
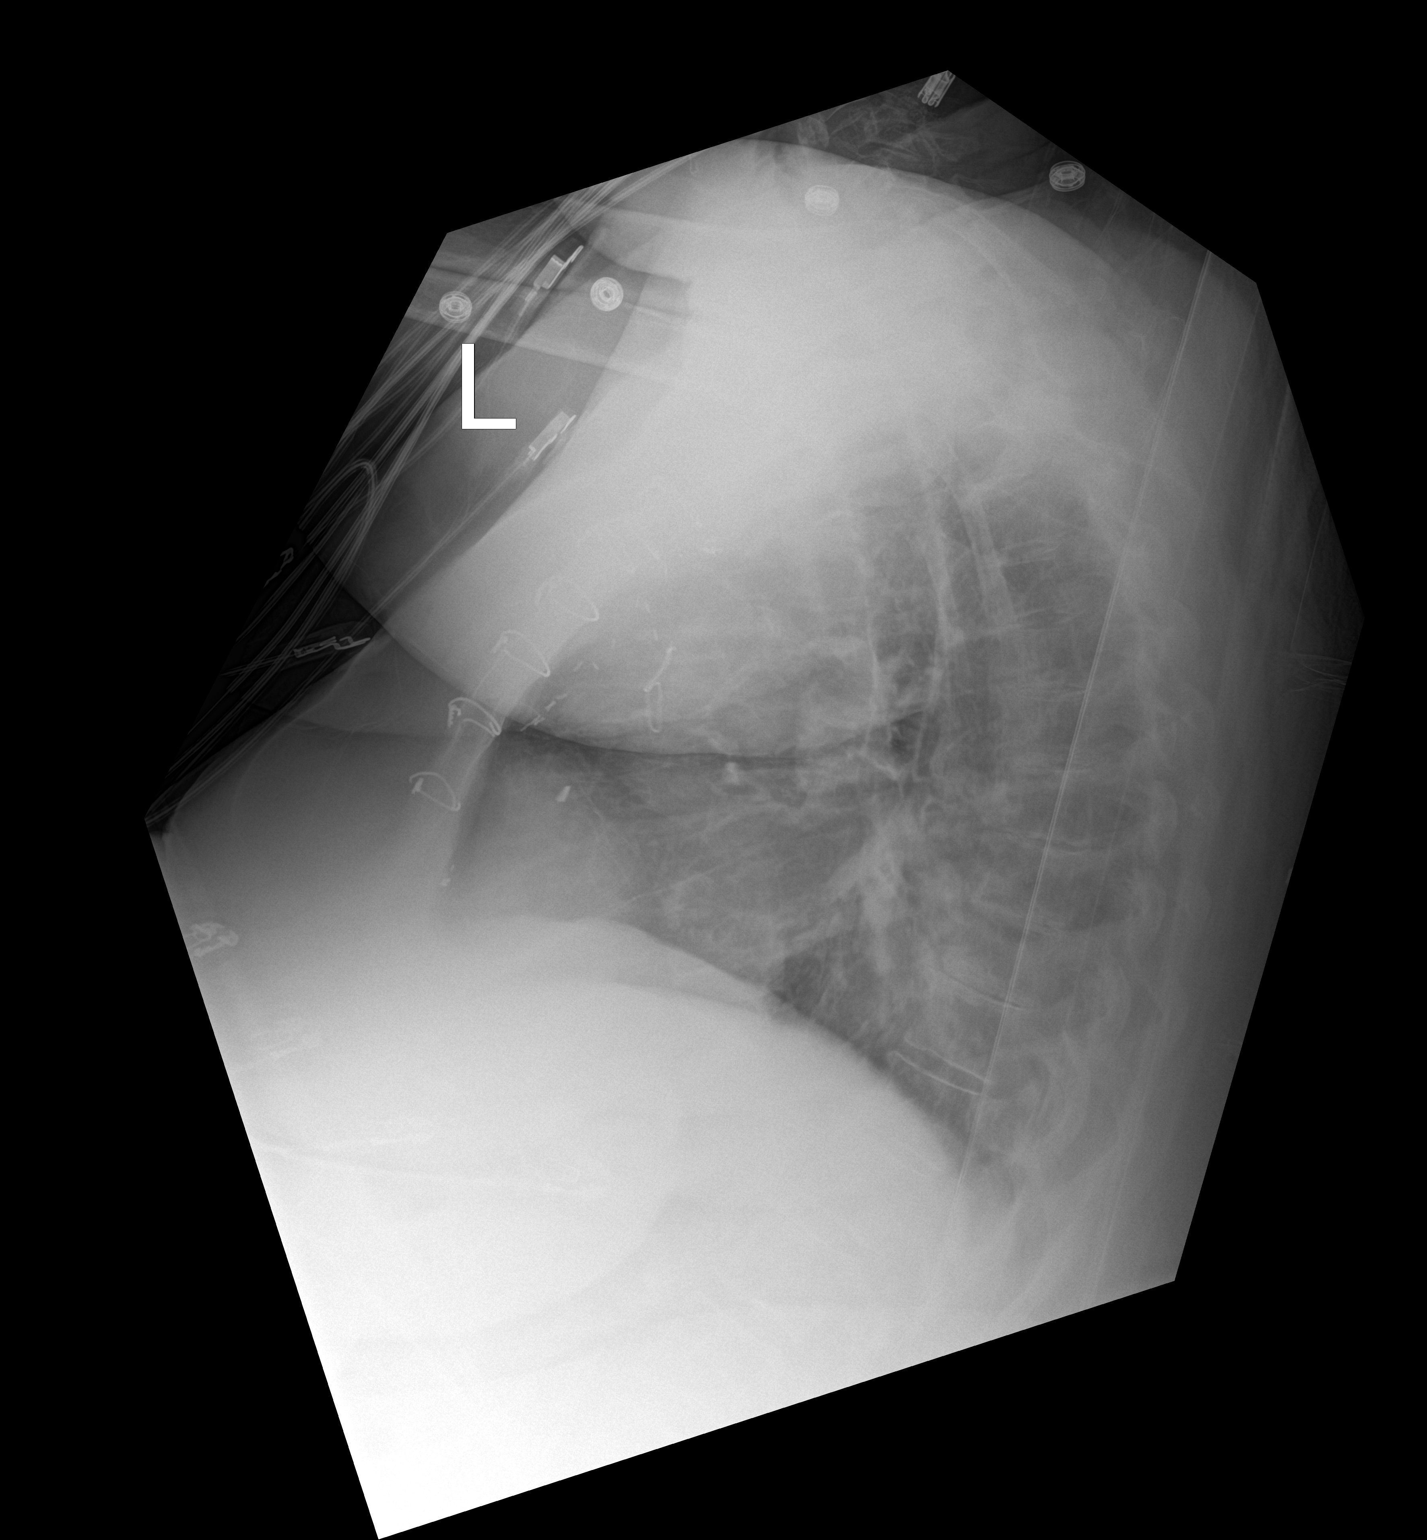

[chest ap]
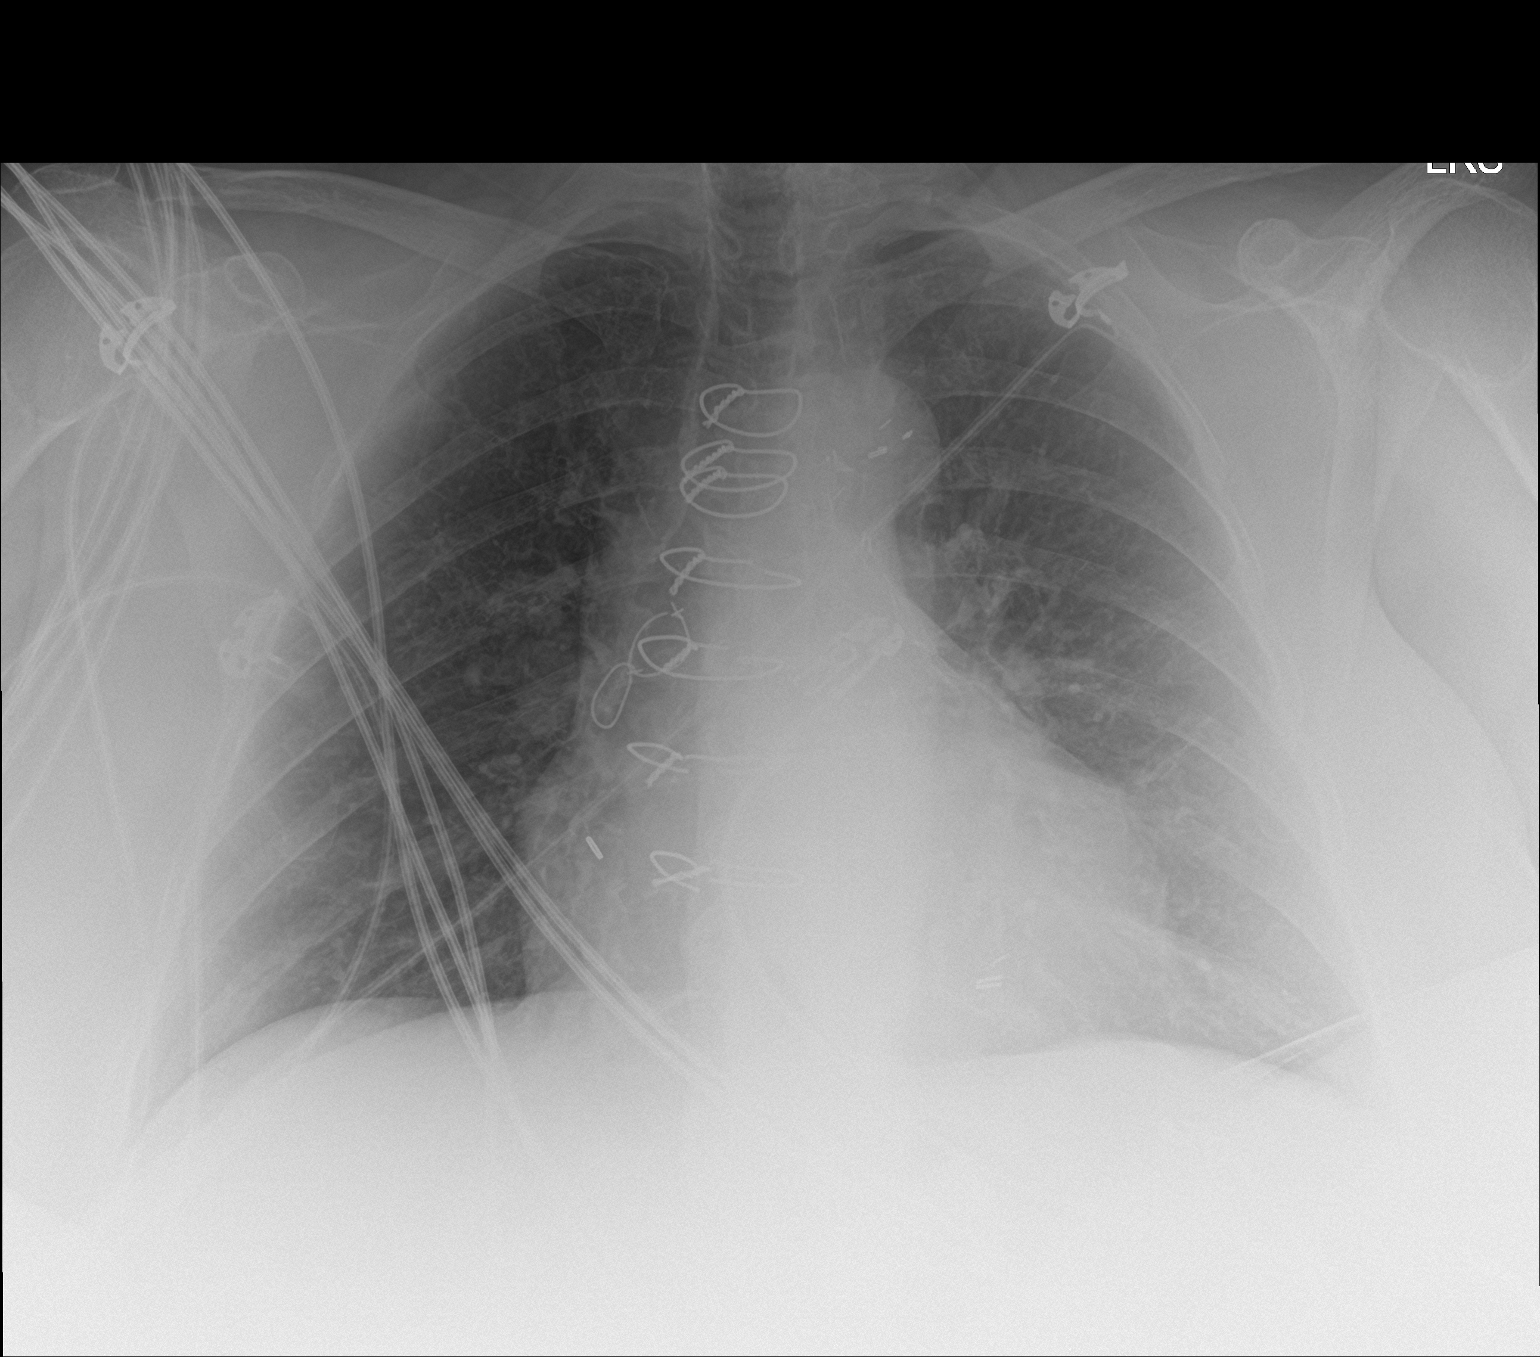

[2 of 2 positions shown; findings below may reference images not displayed]

FINDINGS: Stable cardiomegaly given projection and technique. Post median
sternotomy with multiple stable broken sternotomy wires. Aortic
atherosclerosis with calcification. Clear lungs. No pleural effusion
or pneumothorax. No acute osseous abnormality is evident.
IMPRESSION: Stable cardiomegaly.  No acute pulmonary process identified.

By: Merve Joshua M.D.

## 2019-02-18 ENCOUNTER — Other Ambulatory Visit: Payer: Self-pay | Admitting: Nurse Practitioner

## 2019-02-19 ENCOUNTER — Encounter: Payer: Self-pay | Admitting: Internal Medicine

## 2019-02-20 ENCOUNTER — Telehealth: Payer: Self-pay | Admitting: *Deleted

## 2019-02-20 ENCOUNTER — Telehealth: Payer: Self-pay | Admitting: Emergency Medicine

## 2019-02-20 DIAGNOSIS — M47816 Spondylosis without myelopathy or radiculopathy, lumbar region: Secondary | ICD-10-CM

## 2019-02-20 DIAGNOSIS — M797 Fibromyalgia: Secondary | ICD-10-CM

## 2019-02-20 MED ORDER — OXYCODONE HCL 5 MG PO TABS: 5.0000 mg | ORAL_TABLET | Freq: Three times a day (TID) | ORAL | 0 refills | Status: DC | PRN

## 2019-02-20 NOTE — Telephone Encounter (Signed)
Attempted to call pt but unable to reach. Left message for pt to return call. 

## 2019-02-20 NOTE — Telephone Encounter (Signed)
Return Katherine [name] call, PMP reviewed last Oxycodone was filled on 01/18/2019. Oxycodone e-scribed today. Katherine [name] is aware, she was instructed to keep her appointment with Dr. Letta Pate, she verbalizes understanding.

## 2019-02-20 NOTE — Telephone Encounter (Signed)
Requesting a refill on her oxycodone. Per PMP last refilled 01/20/19 and her last appt was 02/02/19 with K[facility] for injection and her next appt is 02/27/19 with Dr Letta Pate for an injection.

## 2019-02-21 ENCOUNTER — Other Ambulatory Visit: Payer: Self-pay | Admitting: Physical Medicine & Rehabilitation

## 2019-02-21 ENCOUNTER — Ambulatory Visit: Payer: Medicare Other

## 2019-02-21 DIAGNOSIS — M47816 Spondylosis without myelopathy or radiculopathy, lumbar region: Secondary | ICD-10-CM

## 2019-02-21 DIAGNOSIS — M797 Fibromyalgia: Secondary | ICD-10-CM

## 2019-02-21 NOTE — Telephone Encounter (Signed)
Called and spoke with pt to see if she knew who had tried calling her and what for and pt had no idea who had tried calling her nor did she know what it was for. Stated to pt if someone needed to reach her, they would call her again. Nothing further needed.

## 2019-02-23 ENCOUNTER — Telehealth: Payer: Self-pay

## 2019-02-23 NOTE — Telephone Encounter (Signed)
lmom for prescreen  

## 2019-02-24 ENCOUNTER — Telehealth: Payer: Self-pay | Admitting: Internal Medicine

## 2019-02-24 NOTE — Telephone Encounter (Signed)
Patient called wanting to let you know that she has not been feeling good and is in a lot of pain. She said that her skin feels hot, she is very tired, and all she wants to do is sleep. She is also dealing with depression after her husband's passing  She just wanted you to be aware.

## 2019-02-27 ENCOUNTER — Telehealth: Payer: Self-pay

## 2019-02-27 ENCOUNTER — Ambulatory Visit (INDEPENDENT_AMBULATORY_CARE_PROVIDER_SITE_OTHER): Payer: Medicare Other | Admitting: *Deleted

## 2019-02-27 ENCOUNTER — Other Ambulatory Visit: Payer: Self-pay

## 2019-02-27 ENCOUNTER — Encounter: Payer: Self-pay | Admitting: Physical Medicine & Rehabilitation

## 2019-02-27 ENCOUNTER — Telehealth: Payer: Self-pay | Admitting: *Deleted

## 2019-02-27 ENCOUNTER — Encounter: Payer: Medicare Other | Attending: Physical Medicine & Rehabilitation | Admitting: Physical Medicine & Rehabilitation

## 2019-02-27 VITALS — BP 137/83 | HR 73 | Temp 98.4°F | Ht 61.0 in | Wt 240.0 lb

## 2019-02-27 DIAGNOSIS — F418 Other specified anxiety disorders: Secondary | ICD-10-CM | POA: Diagnosis not present

## 2019-02-27 DIAGNOSIS — I480 Paroxysmal atrial fibrillation: Secondary | ICD-10-CM

## 2019-02-27 DIAGNOSIS — Z8673 Personal history of transient ischemic attack (TIA), and cerebral infarction without residual deficits: Secondary | ICD-10-CM | POA: Insufficient documentation

## 2019-02-27 DIAGNOSIS — M5416 Radiculopathy, lumbar region: Secondary | ICD-10-CM

## 2019-02-27 DIAGNOSIS — Z940 Kidney transplant status: Secondary | ICD-10-CM | POA: Insufficient documentation

## 2019-02-27 DIAGNOSIS — E1122 Type 2 diabetes mellitus with diabetic chronic kidney disease: Secondary | ICD-10-CM | POA: Insufficient documentation

## 2019-02-27 DIAGNOSIS — Z955 Presence of coronary angioplasty implant and graft: Secondary | ICD-10-CM | POA: Diagnosis not present

## 2019-02-27 DIAGNOSIS — Z7901 Long term (current) use of anticoagulants: Secondary | ICD-10-CM | POA: Diagnosis not present

## 2019-02-27 DIAGNOSIS — M545 Low back pain: Secondary | ICD-10-CM | POA: Diagnosis not present

## 2019-02-27 DIAGNOSIS — M170 Bilateral primary osteoarthritis of knee: Secondary | ICD-10-CM | POA: Insufficient documentation

## 2019-02-27 DIAGNOSIS — E1151 Type 2 diabetes mellitus with diabetic peripheral angiopathy without gangrene: Secondary | ICD-10-CM | POA: Diagnosis not present

## 2019-02-27 DIAGNOSIS — I2510 Atherosclerotic heart disease of native coronary artery without angina pectoris: Secondary | ICD-10-CM | POA: Diagnosis not present

## 2019-02-27 DIAGNOSIS — E785 Hyperlipidemia, unspecified: Secondary | ICD-10-CM | POA: Diagnosis not present

## 2019-02-27 DIAGNOSIS — I120 Hypertensive chronic kidney disease with stage 5 chronic kidney disease or end stage renal disease: Secondary | ICD-10-CM | POA: Insufficient documentation

## 2019-02-27 DIAGNOSIS — Z951 Presence of aortocoronary bypass graft: Secondary | ICD-10-CM | POA: Diagnosis not present

## 2019-02-27 DIAGNOSIS — M069 Rheumatoid arthritis, unspecified: Secondary | ICD-10-CM | POA: Insufficient documentation

## 2019-02-27 DIAGNOSIS — J449 Chronic obstructive pulmonary disease, unspecified: Secondary | ICD-10-CM | POA: Diagnosis not present

## 2019-02-27 DIAGNOSIS — Z5181 Encounter for therapeutic drug level monitoring: Secondary | ICD-10-CM

## 2019-02-27 DIAGNOSIS — M79604 Pain in right leg: Secondary | ICD-10-CM | POA: Diagnosis not present

## 2019-02-27 DIAGNOSIS — G4733 Obstructive sleep apnea (adult) (pediatric): Secondary | ICD-10-CM | POA: Diagnosis not present

## 2019-02-27 DIAGNOSIS — E669 Obesity, unspecified: Secondary | ICD-10-CM | POA: Diagnosis not present

## 2019-02-27 DIAGNOSIS — M797 Fibromyalgia: Secondary | ICD-10-CM | POA: Insufficient documentation

## 2019-02-27 DIAGNOSIS — M7591 Shoulder lesion, unspecified, right shoulder: Secondary | ICD-10-CM | POA: Diagnosis not present

## 2019-02-27 DIAGNOSIS — Z79891 Long term (current) use of opiate analgesic: Secondary | ICD-10-CM

## 2019-02-27 DIAGNOSIS — Z87891 Personal history of nicotine dependence: Secondary | ICD-10-CM | POA: Insufficient documentation

## 2019-02-27 DIAGNOSIS — Z9851 Tubal ligation status: Secondary | ICD-10-CM | POA: Insufficient documentation

## 2019-02-27 DIAGNOSIS — Z9049 Acquired absence of other specified parts of digestive tract: Secondary | ICD-10-CM | POA: Insufficient documentation

## 2019-02-27 DIAGNOSIS — Z6841 Body Mass Index (BMI) 40.0 and over, adult: Secondary | ICD-10-CM | POA: Diagnosis not present

## 2019-02-27 DIAGNOSIS — M79605 Pain in left leg: Secondary | ICD-10-CM | POA: Diagnosis not present

## 2019-02-27 LAB — POCT INR: INR: 0.9 — AB (ref 2.0–3.0)

## 2019-02-27 NOTE — Telephone Encounter (Signed)
Relayed Dr. Charm Barges message, she sees Katherine [name] in 3 weeks, will ask during her appointment at that time.

## 2019-02-27 NOTE — Patient Instructions (Signed)
Description   After your procedure continue taking 1 tablet daily except 1/2 tablet on Sundays.  Recheck INR in 1 week after procedure. [facility] [phone] call with procedures or new medications.

## 2019-02-27 NOTE — Telephone Encounter (Signed)
Bruce called to see if pt could have an INR done today as she did not get her INR checked per instructions they gave her. Pt was in their office at this time and due to have the injection done today but since no INR was done they could not do it. He states pt has been off her Coumadin for 7 days now. Pt has an appt scheduled for Thursday but canceled that appt and made one for today. He will tell the pt as they are located on the 1st floor in this building and ensure she gets to this appt on time, as we are working her in.  Unsure who cleared the pt to hold as I do not see anything in the chart.

## 2019-02-27 NOTE — Telephone Encounter (Signed)
I haven't seen her since April. Someone needs to lay eyes on her before ordering those studies.

## 2019-02-27 NOTE — Patient Instructions (Addendum)
You received an epidural steroid injection under fluoroscopic guidance. This is the most accurate way to perform an epidural injection. This injection was performed to relieve thigh or leg or foot pain that may be related to a pinched nerve in the lumbar spine. The local anesthetic injected today may cause numbness in your leg for a couple hours. If it is severe we may need to observe you for 30-60 minutes after the injection. The cortisone medicine injected today may take several days to take full effect. This medicine can also cause facial flushing or feeling of being warm.  This injection may last for days weeks or months. It can be repeated if needed. If it is not effective, another spinal level may need to be injected. Other treatments include medication management as well as physical therapy. In some cases surgery may be an option.  Resume Warfarin per your cardiologiest instructions

## 2019-02-27 NOTE — Telephone Encounter (Signed)
Patient stated at appointment was asked if Dr. Naaman Plummer could put in for bilateral HIP X-rays since she has been taking prednisone medication for an extended period.

## 2019-02-27 NOTE — Progress Notes (Signed)
Left L4-L5 paramedian translaminar lumbar epidural steroid injection under fluoroscopic guidance  Indication: Lumbosacral radiculitis is not relieved by medication management or other conservative care and interfering with self-care and mobility.  No  anticoagulant use.  Discontinuation of warfarin okayed by cardiology, Dr. Ellyn Hack Warfarin discontinued x5 days INR is 0.9 today  Informed consent was obtained after describing risk and benefits of the procedure with the patient, this includes bleeding, bruising, infection, paralysis and medication side effects.  The patient wishes to proceed and has given written consent.  Patient was placed in a prone position.  The lumbar area was marked and prepped with Betadine.  It was entered with a 25-gauge 1-1/2 inch needle and one mL of 1% lidocaine was injected into the skin and subcutaneous tissue.  Then a 17-gauge spinal needle was inserted under fluoroscopic guidance into the left paramedian L4-5 interlaminar space under AP and Lateral imaging.  Once needle tip of approximated the posterior elements, a loss of resistance technique was utilized with lateral imaging.  A positive loss of resistance was obtained and then confirmed by injecting 2 mL's of Omnipaque 180.  Then a solution containing 1.5 mL's of 6mg /ml Celestone and 1.5 mL's of 1% lidocaine was injected.  The patient tolerated procedure well.  Post procedure instructions were given.  Please see post procedure form.

## 2019-02-27 NOTE — Progress Notes (Signed)
  PROCEDURE RECORD [facility]   Name: Katherine [name] DOB:1955-10-30 MRN: [id number]  Date:02/27/2019  Physician: Alysia Penna, MD    Nurse/CMA: Bright CMA  Allergies:  Allergies  Allergen Reactions  . Tetracycline Hives  . Hydromorphone Hcl Nausea And Vomiting  . Niacin Other (See Comments)    Mouth blisters  . Niaspan [Niacin Er] Other (See Comments)    Mouth blisters  . Sulfa Antibiotics Nausea Only and Other (See Comments)    "Tears up stomach"  . Sulfonamide Derivatives Other (See Comments)    Reaction: per patient "tears her stomach up"  . Codeine Nausea And Vomiting  . Erythromycin Nausea And Vomiting  . Hydromorphone Hcl Nausea And Vomiting  . Morphine And Related Nausea And Vomiting  . Nalbuphine Nausea And Vomiting  . Sulfasalazine Nausea Only and Other (See Comments)    per patient "tears her stomach up", "Tears up stomach"  . Tape Rash and Other (See Comments)    No "plastic" tape," please    Consent Signed: Yes.    Is patient diabetic? Yes.    CBG today? 149  Pregnant: No. LMP: No LMP recorded. Patient is postmenopausal. (age 75-55)  Anticoagulants: yes (warfarin-stopped 7-8 days ago) Anti-inflammatory: no Antibiotics: no  Procedure: Left L4-5 Translaminar ESI Position: Prone   Start Time: 1247pm End Time: 1251pm Fluoro Time: 33s  RN/CMA Bright CMA Bright CMA    Time 931am 1257pm    BP 137/83 164/86    Pulse 73 86    Respirations 16 16    O2 Sat 94 94    S/S 6 6    Pain Level 10/10 7/10     D/C home with Niece SHerry, patient A & O X 3, D/C instructions reviewed, and sits independently.

## 2019-03-01 ENCOUNTER — Telehealth: Payer: Self-pay | Admitting: Internal Medicine

## 2019-03-01 NOTE — Telephone Encounter (Signed)
Patient declined to schedule AWV at this time. SF °

## 2019-03-02 ENCOUNTER — Ambulatory Visit: Payer: Medicare Other

## 2019-03-02 ENCOUNTER — Telehealth: Payer: Self-pay

## 2019-03-02 NOTE — Telephone Encounter (Signed)
Patient called, stated that the [facility] she recieved on Monday only lasted for 2 days and wants to know is there anything else that can help for her pain.

## 2019-03-03 ENCOUNTER — Ambulatory Visit: Payer: Medicare Other

## 2019-03-03 NOTE — Telephone Encounter (Signed)
May schedule for L4-5 Transforaminal on Left side.  Will need to stop warfarin x 5 d and get PT INR within 24h of inj.   Needs to be at least 1 mo apart from prior injection

## 2019-03-06 ENCOUNTER — Telehealth: Payer: Self-pay

## 2019-03-06 NOTE — Telephone Encounter (Signed)
lmom for prescreen  

## 2019-03-06 NOTE — Telephone Encounter (Signed)
1. COVID-19 Pre-Screening Questions:  . In the past 7 to 10 days have you had a cough,  shortness of breath, headache, congestion, fever (100 or greater) body aches, chills, sore throat, or sudden loss of taste or sense of smell?  no . Have you been around anyone with known Covid 19.  no . Have you been around anyone who is awaiting Covid 19 test results in the past 7 to 10 days?  no . Have you been around anyone who has been exposed to Covid 19, or has mentioned symptoms of Covid 19 within the past 7 to 10 days?  no    2. Pt advised of visitor restrictions (no visitors allowed except if needed to conduct the visit). Also advised to arrive at appointment time and wear a mask.     

## 2019-03-07 ENCOUNTER — Telehealth: Payer: Self-pay | Admitting: Physical Medicine & Rehabilitation

## 2019-03-07 DIAGNOSIS — M47816 Spondylosis without myelopathy or radiculopathy, lumbar region: Secondary | ICD-10-CM

## 2019-03-07 DIAGNOSIS — M797 Fibromyalgia: Secondary | ICD-10-CM

## 2019-03-07 MED ORDER — OXYCODONE HCL 10 MG PO TABS: 10.0000 mg | ORAL_TABLET | Freq: Three times a day (TID) | ORAL | 0 refills | Status: DC | PRN

## 2019-03-07 NOTE — Telephone Encounter (Signed)
Called and left VM

## 2019-03-07 NOTE — Telephone Encounter (Signed)
Pt called back. I increased her to 10mg  oxycodone for now #60. She will be following up with Dr. Letta Pate for [facility]

## 2019-03-07 NOTE — Telephone Encounter (Signed)
Patient having increased pain and would like to know if Dr. Naaman Plummer can give her an increase on her oxycodone.  She would like for Dr. Naaman Plummer to call to her.

## 2019-03-08 ENCOUNTER — Ambulatory Visit (INDEPENDENT_AMBULATORY_CARE_PROVIDER_SITE_OTHER): Payer: Medicare Other

## 2019-03-08 DIAGNOSIS — Z23 Encounter for immunization: Secondary | ICD-10-CM

## 2019-03-08 DIAGNOSIS — Z299 Encounter for prophylactic measures, unspecified: Secondary | ICD-10-CM

## 2019-03-13 ENCOUNTER — Other Ambulatory Visit: Payer: Self-pay | Admitting: Internal Medicine

## 2019-03-13 ENCOUNTER — Telehealth: Payer: Self-pay | Admitting: Internal Medicine

## 2019-03-13 DIAGNOSIS — F333 Major depressive disorder, recurrent, severe with psychotic symptoms: Secondary | ICD-10-CM

## 2019-03-13 MED ORDER — VIIBRYD 40 MG PO TABS: 40.0000 mg | ORAL_TABLET | Freq: Every day | ORAL | 1 refills | Status: DC

## 2019-03-13 NOTE — Telephone Encounter (Signed)
Medication Refill - Medication:Vilazodone HCl (VIIBRYD  40MG   Has the patient contacted their pharmacy? Yes.   (Agent: If no, request that the patient contact the pharmacy for the refill.) (Agent: If yes, when and what did the pharmacy advise?)  Preferred Pharmacy (with phone number or street name):     [id number] - [place], [place]. [phone] (Phone) [phone] (Fax)   PT STATED SHE IS READY TO START ON THE 40MG   Agent: Please be advised that RX refills may take up to 3 business days. We ask that you follow-up with your pharmacy.

## 2019-03-14 ENCOUNTER — Telehealth: Payer: Self-pay | Admitting: Emergency Medicine

## 2019-03-14 ENCOUNTER — Ambulatory Visit (INDEPENDENT_AMBULATORY_CARE_PROVIDER_SITE_OTHER): Payer: Medicare Other | Admitting: Adult Health

## 2019-03-14 ENCOUNTER — Telehealth: Payer: Self-pay

## 2019-03-14 ENCOUNTER — Encounter: Payer: Self-pay | Admitting: Adult Health

## 2019-03-14 ENCOUNTER — Other Ambulatory Visit: Payer: Self-pay

## 2019-03-14 DIAGNOSIS — J45909 Unspecified asthma, uncomplicated: Secondary | ICD-10-CM

## 2019-03-14 MED ORDER — PREDNISONE 10 MG PO TABS: 10.0000 mg | ORAL_TABLET | Freq: Every day | ORAL | 0 refills | Status: DC

## 2019-03-14 MED ORDER — ALBUTEROL SULFATE (2.5 MG/3ML) 0.083% IN NEBU: 2.5000 mg | INHALATION_SOLUTION | Freq: Four times a day (QID) | RESPIRATORY_TRACT | 2 refills | Status: DC | PRN

## 2019-03-14 NOTE — Telephone Encounter (Signed)
Patient called stating she is having a lot of pain in her leg. Please advise.

## 2019-03-14 NOTE — Telephone Encounter (Signed)
Spoke with pt. States that she feels like she is having a flare. Reports increased shortness of breath and coughing.   Cough is producing thick mucus. Denies issues with chest tightness, wheezing or fever. Advised pt that she would need a televisit, she declined appointments with all NP except Tammy. Televisit has been scheduled today at 1630. Nothing further was needed.

## 2019-03-14 NOTE — Telephone Encounter (Signed)
She really needs to be seen by a provider for any further med changes or adjustments in treatment plan. I just talked to her last week on phone and increased her oxycodone.

## 2019-03-14 NOTE — Progress Notes (Signed)
Virtual Visit via Telephone Note  I connected with Katherine [name] on 03/14/19 at  4:30 PM EDT by telephone and verified that I am speaking with the correct person using two identifiers.  Location: Patient: Home  Provider: Office    I discussed the limitations, risks, security and privacy concerns of performing an evaluation and management service by telephone and the availability of in person appointments. I also discussed with the patient that there may be a patient responsible charge related to this service. The patient expressed understanding and agreed to proceed.   History of Present Illness: 63 year old female former smoker followed for obstructive sleep apnea, obesity hypoventilation syndrome, asthma, chronic cough complicated by GERD and aspiration.  Patient has a very complicated medical history with previous renal transplant on chronic steroids and Imuran.,  Coronary artery disease status post CABG. Morbid obesity status post lap band surgery  Today's televisit is an acute visit for cough and wheezing .  Complains of 1 week of increased wheezing, dry cough and increased dyspnea. No increaed disocolord mucs  Feels hot temps outside are making her breathing not as good. Says she has been under a lot of stress with recent death of husband and sister. Support provided.  She remains on Symbicort Twice daily  , prednisone 5mg  daily . Denies chest pain, increased edema, fever, orthopnea or n/v/d .     Observations/Objective: Talks in full sentences.    Assessment and Plan: Mild AB flare .  Hold on abx at this time . Brief steroid boost.  Trial of ICS neb for 5 days    Plan  Patient Instructions  Take Budesonide Neb Twice daily  For 5 days .  Continue on Symbicort 2 puffs Twice daily  , rinse well after use.  Eat yogurt Twice daily  .  Increase Prednisone 10mg  daily for 1 week then back 5mg  daily .  Albuterol Inhaler or Neb every 4hrs as needed for wheezing /shortness of  breath .  Follow up with Dr. Lamonte Sakai  In 2-3 months and As needed   Please contact office for sooner follow up if symptoms do not improve or worsen or seek emergency care       Follow Up Instructions:    I discussed the assessment and treatment plan with the patient. The patient was provided an opportunity to ask questions and all were answered. The patient agreed with the plan and demonstrated an understanding of the instructions.   The patient was advised to call back or seek an in-person evaluation if the symptoms worsen or if the condition fails to improve as anticipated.  I provided 23 minutes of non-face-to-face time during this encounter.   Rexene Edison, NP

## 2019-03-14 NOTE — Patient Instructions (Addendum)
Take Budesonide Neb Twice daily  For 5 days .  Continue on Symbicort 2 puffs Twice daily  , rinse well after use.  Eat yogurt Twice daily  .  Increase Prednisone 10mg  daily for 1 week then back 5mg  daily .  Albuterol Inhaler or Neb every 4hrs as needed for wheezing /shortness of breath .  Follow up with Dr. Lamonte Sakai  In 2-3 months and As needed   Please contact office for sooner follow up if symptoms do not improve or worsen or seek emergency care

## 2019-03-15 ENCOUNTER — Telehealth: Payer: Self-pay | Admitting: Adult Health

## 2019-03-15 ENCOUNTER — Other Ambulatory Visit: Payer: Self-pay | Admitting: Emergency Medicine

## 2019-03-15 MED ORDER — ALBUTEROL SULFATE (2.5 MG/3ML) 0.083% IN NEBU: 2.5000 mg | INHALATION_SOLUTION | Freq: Four times a day (QID) | RESPIRATORY_TRACT | 2 refills | Status: DC | PRN

## 2019-03-15 NOTE — Telephone Encounter (Signed)
Spoke with patient who reported she is unable to refill her Albuterol neb soln because the pharmacy is telling her that an "activation code" is needed.  [facility]  Per patient, no call back is needed.  [facility]ed [facility] and spoke with Minette Brine who reported dx code is needed for all neb Rx's.  A new Rx will need to be submitted as Medicare requires this on the actual Rx.  Minette Brine stated the Rx will be able to be refilled for patient as soon as she receives it.  New Rx submitted with dx code. Nothing further needed; will sign off.

## 2019-03-15 NOTE — Telephone Encounter (Signed)
Called to inform patient of message from doctor Naaman Plummer.  Was informed by patient that she had NOT started new prescription yet.  Told her to begin the new dosage and see if that helps.  Was also requested by the patient if she can be placed in for Hip X-Rays as it was being asked of her to ask Dr Naaman Plummer by Dr. Jimmy Footman.

## 2019-03-22 ENCOUNTER — Other Ambulatory Visit: Payer: Self-pay | Admitting: Emergency Medicine

## 2019-03-22 ENCOUNTER — Encounter: Payer: Medicare Other | Attending: Physical Medicine & Rehabilitation | Admitting: Physical Medicine & Rehabilitation

## 2019-03-22 ENCOUNTER — Encounter: Payer: Self-pay | Admitting: Physical Medicine & Rehabilitation

## 2019-03-22 ENCOUNTER — Other Ambulatory Visit: Payer: Self-pay

## 2019-03-22 DIAGNOSIS — M1711 Unilateral primary osteoarthritis, right knee: Secondary | ICD-10-CM

## 2019-03-22 DIAGNOSIS — Z6841 Body Mass Index (BMI) 40.0 and over, adult: Secondary | ICD-10-CM | POA: Insufficient documentation

## 2019-03-22 DIAGNOSIS — J449 Chronic obstructive pulmonary disease, unspecified: Secondary | ICD-10-CM | POA: Insufficient documentation

## 2019-03-22 DIAGNOSIS — M545 Low back pain: Secondary | ICD-10-CM | POA: Insufficient documentation

## 2019-03-22 DIAGNOSIS — M25552 Pain in left hip: Secondary | ICD-10-CM

## 2019-03-22 DIAGNOSIS — E669 Obesity, unspecified: Secondary | ICD-10-CM | POA: Insufficient documentation

## 2019-03-22 DIAGNOSIS — M25551 Pain in right hip: Secondary | ICD-10-CM | POA: Diagnosis not present

## 2019-03-22 DIAGNOSIS — M7591 Shoulder lesion, unspecified, right shoulder: Secondary | ICD-10-CM | POA: Insufficient documentation

## 2019-03-22 DIAGNOSIS — M4726 Other spondylosis with radiculopathy, lumbar region: Secondary | ICD-10-CM

## 2019-03-22 DIAGNOSIS — E785 Hyperlipidemia, unspecified: Secondary | ICD-10-CM | POA: Insufficient documentation

## 2019-03-22 DIAGNOSIS — Z9049 Acquired absence of other specified parts of digestive tract: Secondary | ICD-10-CM | POA: Insufficient documentation

## 2019-03-22 DIAGNOSIS — M797 Fibromyalgia: Secondary | ICD-10-CM | POA: Diagnosis not present

## 2019-03-22 DIAGNOSIS — Z8673 Personal history of transient ischemic attack (TIA), and cerebral infarction without residual deficits: Secondary | ICD-10-CM | POA: Insufficient documentation

## 2019-03-22 DIAGNOSIS — Z951 Presence of aortocoronary bypass graft: Secondary | ICD-10-CM | POA: Insufficient documentation

## 2019-03-22 DIAGNOSIS — Z955 Presence of coronary angioplasty implant and graft: Secondary | ICD-10-CM | POA: Insufficient documentation

## 2019-03-22 DIAGNOSIS — I120 Hypertensive chronic kidney disease with stage 5 chronic kidney disease or end stage renal disease: Secondary | ICD-10-CM | POA: Insufficient documentation

## 2019-03-22 DIAGNOSIS — E1122 Type 2 diabetes mellitus with diabetic chronic kidney disease: Secondary | ICD-10-CM | POA: Insufficient documentation

## 2019-03-22 DIAGNOSIS — M069 Rheumatoid arthritis, unspecified: Secondary | ICD-10-CM | POA: Insufficient documentation

## 2019-03-22 DIAGNOSIS — F418 Other specified anxiety disorders: Secondary | ICD-10-CM | POA: Insufficient documentation

## 2019-03-22 DIAGNOSIS — I2510 Atherosclerotic heart disease of native coronary artery without angina pectoris: Secondary | ICD-10-CM | POA: Insufficient documentation

## 2019-03-22 DIAGNOSIS — Z87891 Personal history of nicotine dependence: Secondary | ICD-10-CM | POA: Insufficient documentation

## 2019-03-22 DIAGNOSIS — E1151 Type 2 diabetes mellitus with diabetic peripheral angiopathy without gangrene: Secondary | ICD-10-CM | POA: Insufficient documentation

## 2019-03-22 DIAGNOSIS — M79605 Pain in left leg: Secondary | ICD-10-CM | POA: Insufficient documentation

## 2019-03-22 DIAGNOSIS — M170 Bilateral primary osteoarthritis of knee: Secondary | ICD-10-CM | POA: Insufficient documentation

## 2019-03-22 DIAGNOSIS — Z940 Kidney transplant status: Secondary | ICD-10-CM | POA: Insufficient documentation

## 2019-03-22 DIAGNOSIS — G4733 Obstructive sleep apnea (adult) (pediatric): Secondary | ICD-10-CM | POA: Insufficient documentation

## 2019-03-22 DIAGNOSIS — M79604 Pain in right leg: Secondary | ICD-10-CM | POA: Insufficient documentation

## 2019-03-22 DIAGNOSIS — Z9851 Tubal ligation status: Secondary | ICD-10-CM | POA: Insufficient documentation

## 2019-03-22 NOTE — Progress Notes (Signed)
Subjective:    Patient ID: Katherine [name], female    DOB: 12/17/1955, 63 y.o.   MRN: [id number]  HPI   Due to national recommendations of social distancing because of COVID 91, an audio/video tele-health visit is felt to be the most appropriate encounter for this patient at this time. See MyChart message from today for the patient's consent to a tele-health encounter with [facility]. This is a follow up telephone visit for the patient who is at home. MD is at office.  t   I am meeting with the patient today regarding her chronic pain. She tells me that she's having a hard time breathing this morning. She is followed closely by pulmonology.   She had temporary results with her initial ESI by Dr. Letta Pate. She noted at least 50% relief in pain for about three days. I set her up with another left transforaminal LESI at L4-5 which is scheduled later this month. Her pain starts in her low back and radiates around the lateral hip into her lower leg. She reports that her leg feels heavy at times. She also reports medial hip pain as well at times left more than right.   The increase in her oxycodone has helped somewhat. She is taking 10mg  every 8 hours as needed. She is also using flexeril 10mg  bid.      Pain Inventory Average Pain 7 Pain Right Now 5 My pain is constant  In the last 24 hours, has pain interfered with the following? General activity 10 Relation with others 8 Enjoyment of life 9 What TIME of day is your pain at its worst? all Sleep (in general) Fair  Pain is worse with: walking, bending, sitting, inactivity, standing and some activites Pain improves with: rest, therapy/exercise and medication Relief from Meds: 5  Mobility use a cane use a walker ability to climb steps?  yes do you drive?  yes  Function disabled: date disabled 1989  Neuro/Psych weakness numbness trouble walking depression anxiety  Prior Studies Any  changes since last visit?  no  Physicians involved in your care Any changes since last visit?  no   Family History  Problem Relation Age of Onset  . Cancer Mother        liver  . Heart disease Father   . Cancer Father        colon  . Arrhythmia Brother        Atrial Fibrillation  . Arrhythmia Paternal Aunt        Atrial Fibrillation   Social History   Socioeconomic History  . Marital status: Married    Spouse name: Not on file  . Number of children: Not on file  . Years of education: Not on file  . Highest education level: Not on file  Occupational History  . Not on file  Social Needs  . Financial resource strain: Not on file  . Food insecurity    Worry: Not on file    Inability: Not on file  . Transportation needs    Medical: Not on file    Non-medical: Not on file  Tobacco Use  . Smoking status: Former Smoker    Packs/day: 1.00    Years: 30.00    Pack years: 30.00    Types: Cigarettes    Quit date: 08/17/2002    Years since quitting: 16.6  . Smokeless tobacco: Never Used  Substance and Sexual Activity  . Alcohol use: No  . Drug  use: No  . Sexual activity: Not on file  Lifestyle  . Physical activity    Days per week: Not on file    Minutes per session: Not on file  . Stress: Not on file  Relationships  . Social Herbalist on phone: Not on file    Gets together: Not on file    Attends religious service: Not on file    Active member of club or organization: Not on file    Attends meetings of clubs or organizations: Not on file    Relationship status: Not on file  Other Topics Concern  . Not on file  Social History Narrative   She is currently married, and the caregiver of her husband who is recovering from surgery for tongue cancer now diagnosed with lung cancer. Prior to his diagnosis of her husband, she actually had adopted a 86-year-old child who she knows caring for as well. With all the surgeries, they have been quite financially troubled.  Thanks the help of her community and church, they have been able to stay "alfoat."     She is a former smoker who quit in 2004 after a 30-pack-year history.   She is active chasing a 52-year-old child, does not do routine exercise. She's been quite depressed with the condition of her husband, and admits to eating comfort herself.   She does not drink alcohol.   Past Surgical History:  Procedure Laterality Date  . ABDOMINAL AORTOGRAM N/A 04/21/2018   Procedure: ABDOMINAL AORTOGRAM;  Surgeon: [name], MD;  Location: [facility];  Service: Cardiovascular;  Laterality: N/A;  . CARDIAC CATHETERIZATION  5/'01, 3/'02, 8/'03, 10/'04; 1/'15   08/22/2013: LAD & RCA 100%; LIMA-LAD & SVG-rPDA patent; Cx-- OM1 60%, OM2 ostial ~50%; SVG-D1 - 80% mid, 50% distal ISR --PCI  . CATHETER REMOVAL    . CHOLECYSTECTOMY N/A 10/29/2014   Procedure: LAPAROSCOPIC CHOLECYSTECTOMY WITH INTRAOPERATIVE CHOLANGIOGRAM;  Surgeon: Excell Seltzer, MD;  Location: [facility];  Service: General;  Laterality: N/A;  . CORONARY ANGIOPLASTY  1994   x5  . CORONARY ARTERY BYPASS GRAFT  1995   LIMA-LAD, SVG-RPDA, SVG-D1  . ESOPHAGOGASTRODUODENOSCOPY N/A 10/15/2016   Procedure: ESOPHAGOGASTRODUODENOSCOPY (EGD);  Surgeon: Wilford Corner, MD;  Location: [facility];  Service: Endoscopy;  Laterality: N/A;  . I&D EXTREMITY Right 01/29/2018   Procedure: IRRIGATION AND DEBRIDEMENT THUMB;  Surgeon: Dayna Barker, MD;  Location: [facility];  Service: Plastics;  Laterality: Right;  . INCISE AND DRAIN ABCESS    . KIDNEY TRANSPLANT  1991  . KNEE ARTHROSCOPY WITH LATERAL MENISECTOMY Left 12/03/2017   Procedure: LEFT KNEE ARTHROSCOPY WITH LATERAL MENISECTOMY;  Surgeon: Earlie Server, MD;  Location: [facility];  Service: Orthopedics;  Laterality: Left;  . LAPAROSCOPIC GASTRIC BANDING  04/2004; 10/'09, 2/'10   Port Replacement x 2  . LEFT HEART CATH AND CORS/GRAFTS ANGIOGRAPHY N/A 04/21/2018   Procedure: LEFT HEART CATH AND CORS/GRAFTS ANGIOGRAPHY;   Surgeon: [name], MD;  Location: [facility];  Service: Cardiovascular;  Laterality: N/A;  . LEFT HEART CATHETERIZATION WITH CORONARY/GRAFT ANGIOGRAM N/A 08/23/2013   Procedure: LEFT HEART CATHETERIZATION WITH Beatrix Fetters;  Surgeon: Wellington Hampshire, MD;  Location: [facility];  Service: Cardiovascular;  Laterality: N/A;  . Lower Extremity Arterial Dopplers  08/2013   ABI: R 0.96, L 1.04  . MULTIPLE TOOTH EXTRACTIONS  age 36  . NM MYOVIEW LTD  03/2016   EF 62%. LOW RISK. C/W prior MI -  no Ischemia. Apical hypokinesis.  [name] Kitchen PERCUTANEOUS CORONARY STENT INTERVENTION (PCI-S)  5/'01, 3/'02, 8/'03, 10/'04;   '01 - S660 BMS 2.5 x 9 - dSVG-D1 into D1; '02- post-stent stenosis - 2.5 x 8 Pixel BMS; '8\03: ISR/Thrombosis into native D1 - AngioJet, 2.5 x 13 Pixel; '04 - ISR 95% - covered stented area with Taxus DES 2.5 mm x 20 (2.88)  . PERCUTANEOUS CORONARY STENT INTERVENTION (PCI-S)  08/23/2013   Procedure: PERCUTANEOUS CORONARY STENT INTERVENTION (PCI-S);  Surgeon: Wellington Hampshire, MD;  Location: [facility] C[facility] LAB;  Service: Cardiovascular;;mid SVG-D1 80%; distal stent ~50% ISR; Promus Prermier DES 2.75 mm xc 20 mm (2.8 mm)  . PORT-A-CATH REMOVAL     kidney  . TRANSTHORACIC ECHOCARDIOGRAM  02/2016   EF 55-60%. Septal dyssynergy from CABG GR 2 DD. Aortic valve is trileaflet the functional bicuspid with sclerosis but not yet notable for stenosis stenosis.; no Significant change  . TUBAL LIGATION    . wrist fistula repair Left    dialysis for one year   Past Medical History:  Diagnosis Date  . Anemia   . Anxiety   . Bilateral carotid artery stenosis    Carotid duplex 12/1698: 1-74% LICA, 94-49% RICA, >67% RECA, f/u 1 yr suggested  . CAD (coronary artery disease) of bypass graft 5/01; 3/'02, 8/'03, 10/'04; 1/15   PCI x 5 to SVG-D1   . CAD in native artery 07/1993    3 Vessel Disease (LAD-D1 & RCA) -- CABG  . CAD S/P percutaneous coronary angioplasty    PCI to SVG-D1 insertion/native  D1 x 4 = '01 -(S660 BMS 2.5 x 9 - insertion into D1; '02 - distal overlap ACS Pixel 2.5 x 8  BMS; '03 distal/native ISR/Thrombosis - Pixel 2.5 x 13; '04 - ISR-  Taxus 2.5 x 20 (covered all);; 1/15 - mid SVG-D1 (50% distal ISR) - Promus P 2.75 x 20 -- 2.8 mm  . COPD mixed type ([place])    Followed by Dr. Lamonte Sakai "pulmonologist said no COPD"  . Depression   . Depression with anxiety   . Diabetes mellitus type 2 in obese ([place])   . Diarrhea    started after cholecystectomy and mass removed from intestine  . Dyslipidemia, goal LDL below 70    08/2012: TC 137, TG 200, HDL 32!, LDL 45; on statin (followed by Dr.Deterding)  . ESRD (end stage renal disease) ([place]) 1991   s/p Cadaveric Renal Transplant [facility] - Dr. Jimmy Footman)   . Family history of adverse reaction to anesthesia    mom's bp dropped during/after anesthesia  . Fibromyalgia   . GERD (gastroesophageal reflux disease)   . Glomerulonephritis, chronic, rapidly progressive 37  . 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 07/1993   Rescue PTCA of RCA -- referred for CABG.  . H/O: GI bleed   . Headache    migraines in the past  . History of kidney stones   . Hypertension associated with diabetes ([place])   . MRSA (methicillin resistant staph aureus) culture positive   . Obesity    s/p Lap Band 04/2004- Port Replacement 10/09 & 2/10 for infection; Dr. Excell Seltzer.  . OSA (obstructive sleep apnea)    no longer on CPAP or home O2, states she doesn't need now after lap band  . PAD (peripheral artery disease) ([place]) 08/2013   LEA Dopplers to be read by Dr. Fletcher Anon  . PAF (paroxysmal atrial fibrillation) ([place]) 06/2014   Noted on CardioNet Monitor  - rate ~112;  no recurrent symptoms. Nonionic regulation because of frequent GI procedures. Patient prefers Plavix  . Pneumonia   . Recurrent boils    Bilateral Groin  . Rheumatoid arthritis ([place])    Per Patient Report; associated with OA  . 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 Servando Snare:  LIMA-LAD, SVG-bifurcatingD1, SVG-rPDA  . S/p cadaver renal transplant [place]  . Stroke [facility]) 2012   "right eye stroke- half blind now"  . Unstable angina ([place]) 5/01; 3/'02, 8/'03, 10/'04; 1/15   x 5 occurences since Inf-Post STEMI in 1994   There were no vitals taken for this visit.  Opioid Risk Score:   Fall Risk Score:  `1  Depression screen PHQ 2/9  Depression screen [facility] 2/9 11/02/2018 07/04/2018 03/29/2017  Decreased Interest 3 3 3   Down, Depressed, Hopeless 3 3 3   PHQ - 2 Score 6 6 6   Altered sleeping 1 3 -  Tired, decreased energy 1 3 -  Change in appetite 1 2 -  Feeling bad or failure about yourself  0 3 -  Trouble concentrating 0 2 -  Moving slowly or fidgety/restless 0 0 -  Suicidal thoughts 0 0 -  PHQ-9 Score 9 19 -  Difficult doing work/chores - Very difficult -  Some recent data might be hidden     Review of Systems  Constitutional: Negative.   HENT: Negative.   Eyes: Negative.   Respiratory: Negative.   Cardiovascular: Positive for leg swelling.  Gastrointestinal: Negative.   Endocrine: Negative.   Genitourinary: Negative.   Musculoskeletal: Positive for arthralgias, back pain, gait problem and myalgias.  Skin: Negative.   Allergic/Immunologic: Negative.   Neurological: Positive for weakness.  Hematological: Negative.   Psychiatric/Behavioral: Positive for dysphoric mood and sleep disturbance. The patient is nervous/anxious.   All other systems reviewed and are negative.       Assessment & Plan:  1. Fibromyalgia 2. Rheumatoid Arthritis by history 3. Osteoarthritis--polyarticular, bilateral knees quite involved, left more than right 4. ESRD with hx of renal transplant on chronic steroids 5. Right rotator cuff tendonitis/like degenerative/inflammatory changes in the right shoulder also.  6. Hx o falls,gait disorder 7. Right low back pain with radicular symptoms. spondylosison mostrecent plain film 8. SCC of right thumb  .   Plan:  1.encouraged follow up with pulmonogy re: breathing if she doesn't see improvement. 2. oxycodone 10mg  q8 prn, #60--recently filled. No new rx today -We will continue the controlled substance monitoring program, this consists of regular clinic visits, examinations, routine drug screening, pill counts as well as use of [place] Controlled Substance Reporting System. [place] today.   3. Continue cymbalta 4. Continue tegretol200mg  BID for FMS/neuropathic pain 5. Repeat tranforaminal ESI at L4-5 with Dr. Letta Pate 6.will order xray of pelvis given hip sx 8. Reviewed appropriate stretches for low back and radiculopathy with patient 9.Continue with Dr. Sima Matas for coping with recent pain/loss of brother.  13 minutes of tele-visit time was spent with this patient today. Follow up in 1 months with me or NP

## 2019-03-27 ENCOUNTER — Telehealth: Payer: Self-pay | Admitting: Internal Medicine

## 2019-03-27 ENCOUNTER — Other Ambulatory Visit: Payer: Self-pay | Admitting: Internal Medicine

## 2019-03-27 DIAGNOSIS — F333 Major depressive disorder, recurrent, severe with psychotic symptoms: Secondary | ICD-10-CM

## 2019-03-27 NOTE — Telephone Encounter (Signed)
Stop viibryd Is she still taking Cymbalta?

## 2019-03-27 NOTE — Telephone Encounter (Signed)
Relation to pt: self  Call back number:  [phone]  Pharmacy: [id number] - [place], [place]. [phone] (Phone) [phone] (Fax)     Reason for call:  Patient states Vilazodone HCl (VIIBRYD) 40 MG TABS and ARIPiprazole (ABILIFY) 2 MG tablet are not working well together patient states it makes her feel as if she is out of her head.   Patient states ARIPiprazole (ABILIFY) 2 MG tablet is not working well by itself unable to take deep breath only short breath.   Patient states spouse and sister passed away 2 months ago and patient has been very anxious and depressed and started to take Zoloft again no complaints

## 2019-03-27 NOTE — Telephone Encounter (Signed)
I do not think she should take sertraline with Cymbalta.

## 2019-03-27 NOTE — Telephone Encounter (Signed)
Pt informed to d/c Viibryd. She is taking Cymbalta 30 mg 2 cap qam. She is also taking Sertaline, but is unsure of the dose. She feels it's the Abilify. She had her last dose 2 days ago and her breathing is improved.

## 2019-03-28 ENCOUNTER — Other Ambulatory Visit: Payer: Self-pay | Admitting: Internal Medicine

## 2019-03-28 DIAGNOSIS — F333 Major depressive disorder, recurrent, severe with psychotic symptoms: Secondary | ICD-10-CM

## 2019-03-28 NOTE — Telephone Encounter (Signed)
I have entered a psych referral She may benefit from an admission  TJ

## 2019-03-28 NOTE — Telephone Encounter (Signed)
Pt informed of below. She states her pain management Dr rx'd Cymbalta for chronic pain. She states she has lost multiple family members( husband, godson, sister, sister-in-law)  recently, she states "she is about ready to lose her mind" and became tearful. I offered OV, but she requests virtual.   Ok to schedule DOXY?

## 2019-03-29 NOTE — Telephone Encounter (Signed)
Pt contacted and informed of referral. Pt stated understanding but also stated that she did not want to see another doctor and did want to be put on a bunch of medication and should just start smoking weed. I consoled pt and listened to her concerns. I reassured her that she would benefit from seeing psych and a grief counselor.

## 2019-04-09 ENCOUNTER — Other Ambulatory Visit: Payer: Self-pay | Admitting: Emergency Medicine

## 2019-04-10 ENCOUNTER — Ambulatory Visit: Payer: Medicare Other | Admitting: Physical Medicine & Rehabilitation

## 2019-04-10 ENCOUNTER — Ambulatory Visit (INDEPENDENT_AMBULATORY_CARE_PROVIDER_SITE_OTHER): Payer: Medicare Other | Admitting: *Deleted

## 2019-04-10 ENCOUNTER — Other Ambulatory Visit: Payer: Self-pay

## 2019-04-10 ENCOUNTER — Other Ambulatory Visit: Payer: Self-pay | Admitting: Nurse Practitioner

## 2019-04-10 DIAGNOSIS — Z7901 Long term (current) use of anticoagulants: Secondary | ICD-10-CM

## 2019-04-10 DIAGNOSIS — I480 Paroxysmal atrial fibrillation: Secondary | ICD-10-CM

## 2019-04-10 LAB — POCT INR: INR: 1 — AB (ref 2.0–3.0)

## 2019-04-10 NOTE — Patient Instructions (Addendum)
Description   After your procedure continue taking 1 tablet daily except 1/2 tablet on Sundays.  Recheck INR in 1 week after procedure. [facility] [phone] call with procedures or new medications.      

## 2019-04-11 ENCOUNTER — Other Ambulatory Visit: Payer: Self-pay

## 2019-04-11 ENCOUNTER — Encounter (HOSPITAL_BASED_OUTPATIENT_CLINIC_OR_DEPARTMENT_OTHER): Payer: Medicare Other | Admitting: Physical Medicine & Rehabilitation

## 2019-04-11 VITALS — BP 151/71 | HR 69 | Temp 99.6°F | Ht 61.0 in | Wt 238.2 lb

## 2019-04-11 DIAGNOSIS — Z9049 Acquired absence of other specified parts of digestive tract: Secondary | ICD-10-CM | POA: Diagnosis not present

## 2019-04-11 DIAGNOSIS — Z6841 Body Mass Index (BMI) 40.0 and over, adult: Secondary | ICD-10-CM | POA: Diagnosis not present

## 2019-04-11 DIAGNOSIS — M4726 Other spondylosis with radiculopathy, lumbar region: Secondary | ICD-10-CM | POA: Diagnosis not present

## 2019-04-11 DIAGNOSIS — M79604 Pain in right leg: Secondary | ICD-10-CM | POA: Diagnosis not present

## 2019-04-11 DIAGNOSIS — I2510 Atherosclerotic heart disease of native coronary artery without angina pectoris: Secondary | ICD-10-CM | POA: Diagnosis not present

## 2019-04-11 DIAGNOSIS — Z951 Presence of aortocoronary bypass graft: Secondary | ICD-10-CM | POA: Diagnosis not present

## 2019-04-11 DIAGNOSIS — G894 Chronic pain syndrome: Secondary | ICD-10-CM

## 2019-04-11 DIAGNOSIS — Z955 Presence of coronary angioplasty implant and graft: Secondary | ICD-10-CM | POA: Diagnosis not present

## 2019-04-11 DIAGNOSIS — M797 Fibromyalgia: Secondary | ICD-10-CM | POA: Diagnosis not present

## 2019-04-11 DIAGNOSIS — M069 Rheumatoid arthritis, unspecified: Secondary | ICD-10-CM | POA: Diagnosis not present

## 2019-04-11 DIAGNOSIS — E669 Obesity, unspecified: Secondary | ICD-10-CM | POA: Diagnosis not present

## 2019-04-11 DIAGNOSIS — E1122 Type 2 diabetes mellitus with diabetic chronic kidney disease: Secondary | ICD-10-CM | POA: Diagnosis not present

## 2019-04-11 DIAGNOSIS — Z87891 Personal history of nicotine dependence: Secondary | ICD-10-CM | POA: Diagnosis not present

## 2019-04-11 DIAGNOSIS — Z5181 Encounter for therapeutic drug level monitoring: Secondary | ICD-10-CM

## 2019-04-11 DIAGNOSIS — Z79891 Long term (current) use of opiate analgesic: Secondary | ICD-10-CM

## 2019-04-11 DIAGNOSIS — E785 Hyperlipidemia, unspecified: Secondary | ICD-10-CM | POA: Diagnosis not present

## 2019-04-11 DIAGNOSIS — M170 Bilateral primary osteoarthritis of knee: Secondary | ICD-10-CM | POA: Diagnosis not present

## 2019-04-11 DIAGNOSIS — G4733 Obstructive sleep apnea (adult) (pediatric): Secondary | ICD-10-CM | POA: Diagnosis not present

## 2019-04-11 DIAGNOSIS — M79605 Pain in left leg: Secondary | ICD-10-CM | POA: Diagnosis not present

## 2019-04-11 DIAGNOSIS — M545 Low back pain: Secondary | ICD-10-CM | POA: Diagnosis not present

## 2019-04-11 DIAGNOSIS — E1151 Type 2 diabetes mellitus with diabetic peripheral angiopathy without gangrene: Secondary | ICD-10-CM | POA: Diagnosis not present

## 2019-04-11 DIAGNOSIS — Z9851 Tubal ligation status: Secondary | ICD-10-CM | POA: Diagnosis not present

## 2019-04-11 DIAGNOSIS — M7591 Shoulder lesion, unspecified, right shoulder: Secondary | ICD-10-CM | POA: Diagnosis not present

## 2019-04-11 DIAGNOSIS — Z8673 Personal history of transient ischemic attack (TIA), and cerebral infarction without residual deficits: Secondary | ICD-10-CM | POA: Diagnosis not present

## 2019-04-11 DIAGNOSIS — J449 Chronic obstructive pulmonary disease, unspecified: Secondary | ICD-10-CM | POA: Diagnosis not present

## 2019-04-11 DIAGNOSIS — Z940 Kidney transplant status: Secondary | ICD-10-CM | POA: Diagnosis not present

## 2019-04-11 DIAGNOSIS — F418 Other specified anxiety disorders: Secondary | ICD-10-CM | POA: Diagnosis not present

## 2019-04-11 DIAGNOSIS — I120 Hypertensive chronic kidney disease with stage 5 chronic kidney disease or end stage renal disease: Secondary | ICD-10-CM | POA: Diagnosis not present

## 2019-04-11 NOTE — Patient Instructions (Addendum)
Call [facility] with your pill count and she will order your medications You will see her next week and review oral swab results then   You received an epidural steroid injection under fluoroscopic guidance. This is the most accurate way to perform an epidural injection. This injection was performed to relieve thigh or leg or foot pain that may be related to a pinched nerve in the lumbar spine. The local anesthetic injected today may cause numbness in your leg for a couple hours. If it is severe we may need to observe you for 30-60 minutes after the injection. The cortisone medicine injected today may take several days to take full effect. This medicine can also cause facial flushing or feeling of being warm.  This injection may last for days weeks or months. It can be repeated if needed. If it is not effective, another spinal level may need to be injected. Other treatments include medication management as well as physical therapy. In some cases surgery may be an option.  May resume warfarin today

## 2019-04-11 NOTE — Progress Notes (Signed)
  PROCEDURE RECORD [facility]   Name: Elasha Tess DOB:01/19/1956 MRN: [id number]  Date:04/11/2019  Physician: Alysia Penna, MD    Nurse/CMA:  Allergies:  Allergies  Allergen Reactions  . Tetracycline Hives  . Hydromorphone Hcl Nausea And Vomiting  . Niacin Other (See Comments)    Mouth blisters  . Niaspan [Niacin Er] Other (See Comments)    Mouth blisters  . Sulfa Antibiotics Nausea Only and Other (See Comments)    "Tears up stomach"  . Sulfonamide Derivatives Other (See Comments)    Reaction: per patient "tears her stomach up"  . Codeine Nausea And Vomiting  . Erythromycin Nausea And Vomiting  . Hydromorphone Hcl Nausea And Vomiting  . Morphine And Related Nausea And Vomiting  . Nalbuphine Nausea And Vomiting  . Sulfasalazine Nausea Only and Other (See Comments)    per patient "tears her stomach up", "Tears up stomach"  . Tape Rash and Other (See Comments)    No "plastic" tape," please    Consent Signed: Yes.    Is patient diabetic? Yes.    CBG today? 200  Pregnant: No. LMP: No LMP recorded. Patient is postmenopausal. (age 30-55)  Anticoagulants: yes (Coumadin, went off for 7 days for injection) Anti-inflammatory: no Antibiotics: no  Procedure: Left L4-5 Transformanial Epidural Steroid Injection Position: Prone   Start Time: 140pm End Time: 145pm Fluoro Time: 31s  RN/CMA Truman Hayward, CMA Bright CMA    Time 1:21pm 150pm    BP 151/71 167/83    Pulse 69 75    Respirations 16 16    O2 Sat 94 94    S/S 6 6    Pain Level 7/10 4/10     D/C home with niece, patient A & O X 3, D/C instructions reviewed, and sits independently.

## 2019-04-16 LAB — DRUG TOX MONITOR 1 W/CONF, ORAL FLD
Alprazolam: NEGATIVE ng/mL (ref <0.50)
Amphetamines: NEGATIVE ng/mL (ref <10)
Barbiturates: NEGATIVE ng/mL (ref <10)
Benzodiazepines: POSITIVE ng/mL — AB (ref <0.50)
Buprenorphine: NEGATIVE ng/mL (ref <0.10)
Chlordiazepoxide: NEGATIVE ng/mL (ref <0.50)
Clonazepam: NEGATIVE ng/mL (ref <0.50)
Cocaine: NEGATIVE ng/mL (ref <5.0)
Codeine: NEGATIVE ng/mL (ref <2.5)
Diazepam: NEGATIVE ng/mL (ref <0.50)
Dihydrocodeine: NEGATIVE ng/mL (ref <2.5)
Fentanyl: NEGATIVE ng/mL (ref <0.10)
Flunitrazepam: NEGATIVE ng/mL (ref <0.50)
Flurazepam: NEGATIVE ng/mL (ref <0.50)
Heroin Metabolite: NEGATIVE ng/mL (ref <1.0)
Hydrocodone: NEGATIVE ng/mL (ref <2.5)
Hydromorphone: NEGATIVE ng/mL (ref <2.5)
Lorazepam: 7.49 ng/mL — ABNORMAL HIGH (ref <0.50)
MARIJUANA: NEGATIVE ng/mL (ref <2.5)
MDMA: NEGATIVE ng/mL (ref <10)
Meprobamate: NEGATIVE ng/mL (ref <2.5)
Methadone: NEGATIVE ng/mL (ref <5.0)
Midazolam: NEGATIVE ng/mL (ref <0.50)
Morphine: NEGATIVE ng/mL (ref <2.5)
Nicotine Metabolite: NEGATIVE ng/mL (ref <5.0)
Nordiazepam: NEGATIVE ng/mL (ref <0.50)
Norhydrocodone: NEGATIVE ng/mL (ref <2.5)
Noroxycodone: 10.3 ng/mL — ABNORMAL HIGH (ref <2.5)
Opiates: POSITIVE ng/mL — AB (ref <2.5)
Oxazepam: NEGATIVE ng/mL (ref <0.50)
Oxycodone: 13.1 ng/mL — ABNORMAL HIGH (ref <2.5)
Oxymorphone: NEGATIVE ng/mL (ref <2.5)
Phencyclidine: NEGATIVE ng/mL (ref <10)
Tapentadol: NEGATIVE ng/mL (ref <5.0)
Temazepam: NEGATIVE ng/mL (ref <0.50)
Tramadol: NEGATIVE ng/mL (ref <5.0)
Triazolam: NEGATIVE ng/mL (ref <0.50)
Zolpidem: NEGATIVE ng/mL (ref <5.0)

## 2019-04-16 LAB — DRUG TOX ALC METAB W/CON, ORAL FLD: Alcohol Metabolite: NEGATIVE ng/mL (ref <25)

## 2019-04-17 ENCOUNTER — Telehealth: Payer: Self-pay | Admitting: *Deleted

## 2019-04-17 NOTE — Telephone Encounter (Signed)
Oral swab drug screen was consistent for prescribed medications.  ?

## 2019-04-18 ENCOUNTER — Other Ambulatory Visit: Payer: Self-pay

## 2019-04-18 ENCOUNTER — Encounter (HOSPITAL_COMMUNITY): Payer: Self-pay | Admitting: Nurse Practitioner

## 2019-04-18 ENCOUNTER — Ambulatory Visit (HOSPITAL_COMMUNITY)
Admission: RE | Admit: 2019-04-18 | Discharge: 2019-04-18 | Disposition: A | Discharge status: Home / Self Care | Payer: Medicare Other | Source: Ambulatory Visit | Attending: Nurse Practitioner | Admitting: Nurse Practitioner

## 2019-04-18 VITALS — BP 138/82 | HR 70 | Ht 61.0 in | Wt 239.4 lb

## 2019-04-18 DIAGNOSIS — I2510 Atherosclerotic heart disease of native coronary artery without angina pectoris: Secondary | ICD-10-CM | POA: Insufficient documentation

## 2019-04-18 DIAGNOSIS — E785 Hyperlipidemia, unspecified: Secondary | ICD-10-CM | POA: Insufficient documentation

## 2019-04-18 DIAGNOSIS — Z8673 Personal history of transient ischemic attack (TIA), and cerebral infarction without residual deficits: Secondary | ICD-10-CM | POA: Diagnosis not present

## 2019-04-18 DIAGNOSIS — M797 Fibromyalgia: Secondary | ICD-10-CM | POA: Diagnosis not present

## 2019-04-18 DIAGNOSIS — I480 Paroxysmal atrial fibrillation: Secondary | ICD-10-CM

## 2019-04-18 DIAGNOSIS — Z87891 Personal history of nicotine dependence: Secondary | ICD-10-CM | POA: Insufficient documentation

## 2019-04-18 DIAGNOSIS — F419 Anxiety disorder, unspecified: Secondary | ICD-10-CM | POA: Diagnosis not present

## 2019-04-18 DIAGNOSIS — I739 Peripheral vascular disease, unspecified: Secondary | ICD-10-CM | POA: Insufficient documentation

## 2019-04-18 DIAGNOSIS — D649 Anemia, unspecified: Secondary | ICD-10-CM | POA: Diagnosis not present

## 2019-04-18 DIAGNOSIS — Z951 Presence of aortocoronary bypass graft: Secondary | ICD-10-CM | POA: Diagnosis not present

## 2019-04-18 DIAGNOSIS — Z7901 Long term (current) use of anticoagulants: Secondary | ICD-10-CM | POA: Insufficient documentation

## 2019-04-18 DIAGNOSIS — F329 Major depressive disorder, single episode, unspecified: Secondary | ICD-10-CM | POA: Diagnosis not present

## 2019-04-18 DIAGNOSIS — M069 Rheumatoid arthritis, unspecified: Secondary | ICD-10-CM | POA: Diagnosis not present

## 2019-04-18 DIAGNOSIS — Z794 Long term (current) use of insulin: Secondary | ICD-10-CM | POA: Diagnosis not present

## 2019-04-18 DIAGNOSIS — Z23 Encounter for immunization: Secondary | ICD-10-CM | POA: Diagnosis not present

## 2019-04-18 DIAGNOSIS — I4891 Unspecified atrial fibrillation: Secondary | ICD-10-CM | POA: Diagnosis not present

## 2019-04-18 DIAGNOSIS — Z79899 Other long term (current) drug therapy: Secondary | ICD-10-CM | POA: Diagnosis not present

## 2019-04-18 DIAGNOSIS — E1169 Type 2 diabetes mellitus with other specified complication: Secondary | ICD-10-CM | POA: Diagnosis not present

## 2019-04-18 DIAGNOSIS — K219 Gastro-esophageal reflux disease without esophagitis: Secondary | ICD-10-CM | POA: Insufficient documentation

## 2019-04-18 LAB — BASIC METABOLIC PANEL
Anion gap: 13 (ref 5–15)
BUN: 13 mg/dL (ref 8–23)
CO2: 21 mmol/L — ABNORMAL LOW (ref 22–32)
Calcium: 9.2 mg/dL (ref 8.9–10.3)
Chloride: 102 mmol/L (ref 98–111)
Creatinine, Ser: 1.08 mg/dL — ABNORMAL HIGH (ref 0.44–1.00)
GFR calc Af Amer: >60 mL/min (ref >60)
GFR calc non Af Amer: 55 mL/min — ABNORMAL LOW (ref >60)
Glucose, Bld: 202 mg/dL — ABNORMAL HIGH (ref 70–99)
Potassium: 4.9 mmol/L (ref 3.5–5.1)
Sodium: 136 mmol/L (ref 135–145)

## 2019-04-18 LAB — MAGNESIUM: Magnesium: 1.9 mg/dL (ref 1.7–2.4)

## 2019-04-18 MED ORDER — DOFETILIDE 250 MCG PO CAPS: ORAL_CAPSULE | ORAL | 6 refills | Status: DC

## 2019-04-18 NOTE — Progress Notes (Signed)
Primary Care Physician: [name], MD Cardiologist: Dr. Ellyn Hack  EP: Dr. Charmaine Downs Katherine [name] is a 63 y.o. female with a h/o afib on tikoyn being seen in the afib clinic for refills of tikosyn. Pt had appointments made in the past year, but  failed to show up.So, she was told if not seen today, we would not be able to refill Tikosyn.   She is in SR today and states that she has had minimal issues with afib. Rarely will feel heart race.  She has a lot of pain with back and hips and had a back injection a couple of weeks ago.  She also has CAD with ekg showing LBBB today and has not seen her cardiologist in over 18 months. She currently denies any exertional chest pain  but is mostly non exertional.  Today, she denies symptoms of palpitations, chest pain, shortness of breath, orthopnea, PND, lower extremity edema, dizziness, presyncope, syncope, or neurologic sequela. The patient is tolerating medications without difficulties and is otherwise without complaint today.   Past Medical History:  Diagnosis Date  . Anemia   . Anxiety   . Bilateral carotid artery stenosis    Carotid duplex 01/7892: 8-10% LICA, 17-51% RICA, >02% RECA, f/u 1 yr suggested  . CAD (coronary artery disease) of bypass graft 5/01; 3/'02, 8/'03, 10/'04; 1/15   PCI x 5 to SVG-D1   . CAD in native artery 07/1993    3 Vessel Disease (LAD-D1 & RCA) -- CABG  . CAD S/P percutaneous coronary angioplasty    PCI to SVG-D1 insertion/native D1 x 4 = '01 -(S660 BMS 2.5 x 9 - insertion into D1; '02 - distal overlap ACS Pixel 2.5 x 8  BMS; '03 distal/native ISR/Thrombosis - Pixel 2.5 x 13; '04 - ISR-  Taxus 2.5 x 20 (covered all);; 1/15 - mid SVG-D1 (50% distal ISR) - Promus P 2.75 x 20 -- 2.8 mm  . COPD mixed type ([place])    Followed by Dr. Lamonte Sakai "pulmonologist said no COPD"  . Depression   . Depression with anxiety   . Diabetes mellitus type 2 in obese ([place])   . Diarrhea    started after cholecystectomy and mass  removed from intestine  . Dyslipidemia, goal LDL below 70    08/2012: TC 137, TG 200, HDL 32!, LDL 45; on statin (followed by Dr.Deterding)  . ESRD (end stage renal disease) ([place]) 1991   s/p Cadaveric Renal Transplant [facility] - Dr. Jimmy Footman)   . Family history of adverse reaction to anesthesia    mom's bp dropped during/after anesthesia  . Fibromyalgia   . GERD (gastroesophageal reflux disease)   . Glomerulonephritis, chronic, rapidly progressive 28  . 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 07/1993   Rescue PTCA of RCA -- referred for CABG.  . H/O: GI bleed   . Headache    migraines in the past  . History of kidney stones   . Hypertension associated with diabetes ([place])   . MRSA (methicillin resistant staph aureus) culture positive   . Obesity    s/p Lap Band 04/2004- Port Replacement 10/09 & 2/10 for infection; Dr. Excell Seltzer.  . OSA (obstructive sleep apnea)    no longer on CPAP or home O2, states she doesn't need now after lap band  . PAD (peripheral artery disease) ([place]) 08/2013   LEA Dopplers to be read by Dr. Fletcher Anon  . PAF (paroxysmal atrial fibrillation) ([place]) 06/2014   Noted on  CardioNet Monitor  - rate ~112; no recurrent symptoms. Nonionic regulation because of frequent GI procedures. Patient prefers Plavix  . Pneumonia   . Recurrent boils    Bilateral Groin  . Rheumatoid arthritis ([place])    Per Patient Report; associated with OA  . 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 Servando Snare: LIMA-LAD, SVG-bifurcatingD1, SVG-rPDA  . S/p cadaver renal transplant [place]  . Stroke [facility]) 2012   "right eye stroke- half blind now"  . Unstable angina ([place]) 5/01; 3/'02, 8/'03, 10/'04; 1/15   x 5 occurences since Inf-Post STEMI in 1994   Past Surgical History:  Procedure Laterality Date  . ABDOMINAL AORTOGRAM N/A 04/21/2018   Procedure: ABDOMINAL AORTOGRAM;  Surgeon: [name], MD;  Location: [place] [facility];  Service: Cardiovascular;  Laterality: N/A;  .  CARDIAC CATHETERIZATION  5/'01, 3/'02, 8/'03, 10/'04; 1/'15   08/22/2013: LAD & RCA 100%; LIMA-LAD & SVG-rPDA patent; Cx-- OM1 60%, OM2 ostial ~50%; SVG-D1 - 80% mid, 50% distal ISR --PCI  . CATHETER REMOVAL    . CHOLECYSTECTOMY N/A 10/29/2014   Procedure: LAPAROSCOPIC CHOLECYSTECTOMY WITH INTRAOPERATIVE CHOLANGIOGRAM;  Surgeon: Excell Seltzer, MD;  Location: [facility];  Service: General;  Laterality: N/A;  . CORONARY ANGIOPLASTY  1994   x5  . CORONARY ARTERY BYPASS GRAFT  1995   LIMA-LAD, SVG-RPDA, SVG-D1  . ESOPHAGOGASTRODUODENOSCOPY N/A 10/15/2016   Procedure: ESOPHAGOGASTRODUODENOSCOPY (EGD);  Surgeon: Wilford Corner, MD;  Location: [facility] ENDOSCOPY;  Service: Endoscopy;  Laterality: N/A;  . I&D EXTREMITY Right 01/29/2018   Procedure: IRRIGATION AND DEBRIDEMENT THUMB;  Surgeon: Dayna Barker, MD;  Location: [facility];  Service: Plastics;  Laterality: Right;  . INCISE AND DRAIN ABCESS    . KIDNEY TRANSPLANT  1991  . KNEE ARTHROSCOPY WITH LATERAL MENISECTOMY Left 12/03/2017   Procedure: LEFT KNEE ARTHROSCOPY WITH LATERAL MENISECTOMY;  Surgeon: Earlie Server, MD;  Location: [facility];  Service: Orthopedics;  Laterality: Left;  . LAPAROSCOPIC GASTRIC BANDING  04/2004; 10/'09, 2/'10   Port Replacement x 2  . LEFT HEART CATH AND CORS/GRAFTS ANGIOGRAPHY N/A 04/21/2018   Procedure: LEFT HEART CATH AND CORS/GRAFTS ANGIOGRAPHY;  Surgeon: [name], MD;  Location: [facility];  Service: Cardiovascular;  Laterality: N/A;  . LEFT HEART CATHETERIZATION WITH CORONARY/GRAFT ANGIOGRAM N/A 08/23/2013   Procedure: LEFT HEART CATHETERIZATION WITH Beatrix Fetters;  Surgeon: Wellington Hampshire, MD;  Location: [facility] CATH LAB;  Service: Cardiovascular;  Laterality: N/A;  . Lower Extremity Arterial Dopplers  08/2013   ABI: R 0.96, L 1.04  . MULTIPLE TOOTH EXTRACTIONS  age 89  . NM MYOVIEW LTD  03/2016   EF 62%. LOW RISK. C/W prior MI - no Ischemia. Apical hypokinesis.  [name] Kitchen PERCUTANEOUS CORONARY STENT INTERVENTION  (PCI-S)  5/'01, 3/'02, 8/'03, 10/'04;   '01 - S660 BMS 2.5 x 9 - dSVG-D1 into D1; '02- post-stent stenosis - 2.5 x 8 Pixel BMS; '8\03: ISR/Thrombosis into native D1 - AngioJet, 2.5 x 13 Pixel; '04 - ISR 95% - covered stented area with Taxus DES 2.5 mm x 20 (2.88)  . PERCUTANEOUS CORONARY STENT INTERVENTION (PCI-S)  08/23/2013   Procedure: PERCUTANEOUS CORONARY STENT INTERVENTION (PCI-S);  Surgeon: Wellington Hampshire, MD;  Location: [facility] CATH LAB;  Service: Cardiovascular;;mid SVG-D1 80%; distal stent ~50% ISR; Promus [place] DES 2.75 mm xc 20 mm (2.8 mm)  . PORT-A-CATH REMOVAL     kidney  . TRANSTHORACIC ECHOCARDIOGRAM  02/2016   EF 55-60%. Septal dyssynergy from CABG GR 2 DD.  Aortic valve is trileaflet the functional bicuspid with sclerosis but not yet notable for stenosis stenosis.; no Significant change  . TUBAL LIGATION    . wrist fistula repair Left    dialysis for one year    Current Outpatient Medications  Medication Sig Dispense Refill  . acetaminophen (TYLENOL) 500 MG tablet Take 1 tablet (500 mg total) by mouth every 6 (six) hours as needed for moderate pain or headache. (Patient taking differently: Take 1,000 mg by mouth every 6 (six) hours as needed for moderate pain or headache. ) 30 tablet 0  . albuterol (PROVENTIL) (2.5 MG/3ML) 0.083% nebulizer solution Take 3 mLs (2.5 mg total) by nebulization every 6 (six) hours as needed for wheezing or shortness of breath (dx: J45.20). 75 mL 2  . azaTHIOprine (IMURAN) 50 MG tablet Take 125 mg by mouth every evening.     . calcitRIOL (ROCALTROL) 0.25 MCG capsule Take 0.25 mcg by mouth every 3 (three) days.     . carbamazepine (TEGRETOL) 200 MG tablet TAKE 1 TABLET BY MOUTH TWICE A DAY 180 tablet 0  . clotrimazole (MYCELEX) 10 MG troche Take 1 tablet (10 mg total) by mouth 5 (five) times daily. 35 tablet 0  . cyclobenzaprine (FLEXERIL) 10 MG tablet TAKE 1 TABLET BY MOUTH 3 TIMES DAILY AS NEEDED FOR MUSCLE SPASMS (Patient taking differently: Take 10  mg by mouth 2 (two) times daily. ) 90 tablet 2  . diltiazem (CARDIZEM CD) 120 MG 24 hr capsule TAKE 1 CAPSULE BY MOUTH EVERY DAY 60 capsule 0  . dofetilide (TIKOSYN) 250 MCG capsule TAKE 1 CAPSULE BY MOUTH 2 (TWO) TIMES DAILY. 60 capsule 6  . DULoxetine (CYMBALTA) 30 MG capsule Take 3 capsules (90 mg total) by mouth every morning. 180 capsule 2  . famotidine (PEPCID) 20 MG tablet Take 20 mg by mouth daily.    . fluticasone (FLONASE) 50 MCG/ACT nasal spray PLACE 2 SPRAYS INTO BOTH NOSTRILS 2 TIMES DAILY 48 mL 3  . glimepiride (AMARYL) 4 MG tablet Take 4 mg by mouth every morning.  6  . guaiFENesin (MUCINEX) 600 MG 12 hr tablet Take 1 tablet (600 mg total) by mouth 2 (two) times daily. 30 tablet 0  . Insulin Glargine (BASAGLAR KWIKPEN) 100 UNIT/ML SOPN Inject 12 Units into the skin daily.    . metFORMIN (GLUCOPHAGE) 500 MG tablet Take 500 mg by mouth 2 (two) times daily.  6  . metoprolol tartrate (LOPRESSOR) 25 MG tablet TAKE 1 TABLET BY MOUTH TWICE A DAY 180 tablet 1  . montelukast (SINGULAIR) 10 MG tablet TAKE 1 TABLET BY MOUTH EVERY DAY 90 tablet 1  . nitroGLYCERIN (NITROSTAT) 0.4 MG SL tablet Place 1 tablet (0.4 mg total) under the tongue every 5 (five) minutes as needed for chest pain. 25 tablet 6  . NOVOLOG FLEXPEN 100 UNIT/ML FlexPen Inject 4 Units into the skin 2 (two) times daily as needed for high blood sugar.   6  . oxyCODONE 10 MG TABS Take 1 tablet (10 mg total) by mouth every 8 (eight) hours as needed for severe pain. 60 tablet 0  . rosuvastatin (CRESTOR) 20 MG tablet TAKE 1 TABLET BY MOUTH EVERY DAY 90 tablet 3  . SYMBICORT 160-4.5 MCG/ACT inhaler TAKE 2 PUFFS BY MOUTH TWICE A DAY 30.6 Inhaler 1  . VENTOLIN HFA 108 (90 Base) MCG/ACT inhaler TAKE 2 PUFFS BY MOUTH EVERY 6 HOURS AS NEEDED FOR WHEEZE OR SHORTNESS OF BREATH 18 g 5  . warfarin (COUMADIN) 10 MG  tablet TAKE 1 TABLET DAILY OR AS DIRECTED BY COUMADIN CLINIC 100 tablet 1   No current facility-administered medications for this  encounter.     Allergies  Allergen Reactions  . Tetracycline Hives  . Hydromorphone Hcl Nausea And Vomiting  . Niacin Other (See Comments)    Mouth blisters  . Niaspan [Niacin Er] Other (See Comments)    Mouth blisters  . Sulfa Antibiotics Nausea Only and Other (See Comments)    "Tears up stomach"  . Sulfonamide Derivatives Other (See Comments)    Reaction: per patient "tears her stomach up"  . Codeine Nausea And Vomiting  . Erythromycin Nausea And Vomiting  . Hydromorphone Hcl Nausea And Vomiting  . Morphine And Related Nausea And Vomiting  . Nalbuphine Nausea And Vomiting  . Sulfasalazine Nausea Only and Other (See Comments)    per patient "tears her stomach up", "Tears up stomach"  . Tape Rash and Other (See Comments)    No "plastic" tape," please    Social History   Socioeconomic History  . Marital status: Married    Spouse name: Not on file  . Number of children: Not on file  . Years of education: Not on file  . Highest education level: Not on file  Occupational History  . Not on file  Social Needs  . Financial resource strain: Not on file  . Food insecurity    Worry: Not on file    Inability: Not on file  . Transportation needs    Medical: Not on file    Non-medical: Not on file  Tobacco Use  . Smoking status: Former Smoker    Packs/day: 1.00    Years: 30.00    Pack years: 30.00    Types: Cigarettes    Quit date: 08/17/2002    Years since quitting: 16.6  . Smokeless tobacco: Never Used  Substance and Sexual Activity  . Alcohol use: No  . Drug use: No  . Sexual activity: Not on file  Lifestyle  . Physical activity    Days per week: Not on file    Minutes per session: Not on file  . Stress: Not on file  Relationships  . Social Herbalist on phone: Not on file    Gets together: Not on file    Attends religious service: Not on file    Active member of club or organization: Not on file    Attends meetings of clubs or organizations: Not on  file    Relationship status: Not on file  . Intimate partner violence    Fear of current or ex partner: Not on file    Emotionally abused: Not on file    Physically abused: Not on file    Forced sexual activity: Not on file  Other Topics Concern  . Not on file  Social History Narrative   She is currently married, and the caregiver of her husband who is recovering from surgery for tongue cancer now diagnosed with lung cancer. Prior to his diagnosis of her husband, she actually had adopted a 15-year-old child who she knows caring for as well. With all the surgeries, they have been quite financially troubled. Thanks the help of her community and church, they have been able to stay "alfoat."     She is a former smoker who quit in 2004 after a 30-pack-year history.   She is active chasing a 33-year-old child, does not do routine exercise. She's been quite depressed with the condition  of her husband, and admits to eating comfort herself.   She does not drink alcohol.    Family History  Problem Relation Age of Onset  . Cancer Mother        liver  . Heart disease Father   . Cancer Father        colon  . Arrhythmia Brother        Atrial Fibrillation  . Arrhythmia Paternal Aunt        Atrial Fibrillation    ROS- All systems are reviewed and negative except as per the HPI above  Physical Exam: Vitals:   04/18/19 1405  BP: 138/82  Pulse: 70  Weight: 108.6 kg  Height: 5\' 1"  (1.549 m)   Wt Readings from Last 3 Encounters:  04/18/19 108.6 kg  04/11/19 108 kg  02/27/19 108.9 kg    Labs: Lab Results  Component Value Date   NA 134 (L) 10/21/2018   K 4.4 10/21/2018   CL 100 10/21/2018   CO2 24 10/21/2018   GLUCOSE 96 10/21/2018   BUN 18 10/21/2018   CREATININE 0.89 10/21/2018   CALCIUM 9.8 10/21/2018   PHOS 3.4 09/15/2010   MG 1.8 10/21/2018   Lab Results  Component Value Date   INR 1.0 (A) 04/10/2019   Lab Results  Component Value Date   CHOL 111 11/30/2017   HDL 53.90  11/30/2017   LDLCALC 32 11/30/2017   TRIG 129.0 11/30/2017     GEN- The patient is well appearing, alert and oriented x 3 today.   Head- normocephalic, atraumatic Eyes-  Sclera clear, conjunctiva pink Ears- hearing intact Oropharynx- clear Neck- supple, no JVP Lymph- no cervical lymphadenopathy Lungs- Clear to ausculation bilaterally, normal work of breathing Heart- Regular rate and rhythm, no murmurs, rubs or gallops, PMI not laterally displaced GI- soft, NT, ND, + BS Extremities- no clubbing, cyanosis, or edema MS- no significant deformity or atrophy Skin- no rash or lesion Psych- euthymic mood, full affect Neuro- strength and sensation are intact  EKG-NSR at 70 bpm, LBBB, pr int 170 ms, qrs int 138 ms, qtc 494 ms    Assessment and Plan: 1. AFib Remains in SR on dofetilide 250 mcg bid  Continue diltiazem 120 mg daily  Continue warfarin for a CHA2DS2VASc score of at least 4 Bmet/mag today   2. CAD No anginal symptoms  Have encouraged her to follow up with Dr. Ellyn Hack  in the next 1-3 months  Geroge Baseman. Alithea Lapage, [facility] [address] [place], [place] [id number] [phone]

## 2019-04-21 ENCOUNTER — Encounter: Payer: Self-pay | Admitting: Registered Nurse

## 2019-04-21 ENCOUNTER — Encounter: Payer: Medicare Other | Attending: Physical Medicine & Rehabilitation | Admitting: Registered Nurse

## 2019-04-21 ENCOUNTER — Other Ambulatory Visit: Payer: Self-pay

## 2019-04-21 DIAGNOSIS — Z5181 Encounter for therapeutic drug level monitoring: Secondary | ICD-10-CM | POA: Diagnosis not present

## 2019-04-21 DIAGNOSIS — M1711 Unilateral primary osteoarthritis, right knee: Secondary | ICD-10-CM | POA: Diagnosis not present

## 2019-04-21 DIAGNOSIS — F418 Other specified anxiety disorders: Secondary | ICD-10-CM | POA: Insufficient documentation

## 2019-04-21 DIAGNOSIS — I120 Hypertensive chronic kidney disease with stage 5 chronic kidney disease or end stage renal disease: Secondary | ICD-10-CM | POA: Insufficient documentation

## 2019-04-21 DIAGNOSIS — M79605 Pain in left leg: Secondary | ICD-10-CM | POA: Insufficient documentation

## 2019-04-21 DIAGNOSIS — E785 Hyperlipidemia, unspecified: Secondary | ICD-10-CM | POA: Insufficient documentation

## 2019-04-21 DIAGNOSIS — E1122 Type 2 diabetes mellitus with diabetic chronic kidney disease: Secondary | ICD-10-CM | POA: Insufficient documentation

## 2019-04-21 DIAGNOSIS — E669 Obesity, unspecified: Secondary | ICD-10-CM | POA: Insufficient documentation

## 2019-04-21 DIAGNOSIS — M25551 Pain in right hip: Secondary | ICD-10-CM

## 2019-04-21 DIAGNOSIS — Z6841 Body Mass Index (BMI) 40.0 and over, adult: Secondary | ICD-10-CM | POA: Insufficient documentation

## 2019-04-21 DIAGNOSIS — G894 Chronic pain syndrome: Secondary | ICD-10-CM | POA: Diagnosis not present

## 2019-04-21 DIAGNOSIS — M069 Rheumatoid arthritis, unspecified: Secondary | ICD-10-CM | POA: Insufficient documentation

## 2019-04-21 DIAGNOSIS — J449 Chronic obstructive pulmonary disease, unspecified: Secondary | ICD-10-CM | POA: Insufficient documentation

## 2019-04-21 DIAGNOSIS — E1142 Type 2 diabetes mellitus with diabetic polyneuropathy: Secondary | ICD-10-CM

## 2019-04-21 DIAGNOSIS — M5416 Radiculopathy, lumbar region: Secondary | ICD-10-CM

## 2019-04-21 DIAGNOSIS — Z79891 Long term (current) use of opiate analgesic: Secondary | ICD-10-CM | POA: Diagnosis not present

## 2019-04-21 DIAGNOSIS — Z940 Kidney transplant status: Secondary | ICD-10-CM | POA: Insufficient documentation

## 2019-04-21 DIAGNOSIS — M1712 Unilateral primary osteoarthritis, left knee: Secondary | ICD-10-CM | POA: Diagnosis not present

## 2019-04-21 DIAGNOSIS — M797 Fibromyalgia: Secondary | ICD-10-CM | POA: Diagnosis not present

## 2019-04-21 DIAGNOSIS — Z951 Presence of aortocoronary bypass graft: Secondary | ICD-10-CM | POA: Insufficient documentation

## 2019-04-21 DIAGNOSIS — M79604 Pain in right leg: Secondary | ICD-10-CM | POA: Insufficient documentation

## 2019-04-21 DIAGNOSIS — G4733 Obstructive sleep apnea (adult) (pediatric): Secondary | ICD-10-CM | POA: Insufficient documentation

## 2019-04-21 DIAGNOSIS — Z87891 Personal history of nicotine dependence: Secondary | ICD-10-CM | POA: Insufficient documentation

## 2019-04-21 DIAGNOSIS — Z9851 Tubal ligation status: Secondary | ICD-10-CM | POA: Insufficient documentation

## 2019-04-21 DIAGNOSIS — M170 Bilateral primary osteoarthritis of knee: Secondary | ICD-10-CM | POA: Insufficient documentation

## 2019-04-21 DIAGNOSIS — Z955 Presence of coronary angioplasty implant and graft: Secondary | ICD-10-CM | POA: Insufficient documentation

## 2019-04-21 DIAGNOSIS — M25552 Pain in left hip: Secondary | ICD-10-CM | POA: Diagnosis not present

## 2019-04-21 DIAGNOSIS — I2510 Atherosclerotic heart disease of native coronary artery without angina pectoris: Secondary | ICD-10-CM | POA: Insufficient documentation

## 2019-04-21 DIAGNOSIS — M7591 Shoulder lesion, unspecified, right shoulder: Secondary | ICD-10-CM | POA: Insufficient documentation

## 2019-04-21 DIAGNOSIS — E1151 Type 2 diabetes mellitus with diabetic peripheral angiopathy without gangrene: Secondary | ICD-10-CM | POA: Insufficient documentation

## 2019-04-21 DIAGNOSIS — M545 Low back pain: Secondary | ICD-10-CM | POA: Insufficient documentation

## 2019-04-21 DIAGNOSIS — Z9049 Acquired absence of other specified parts of digestive tract: Secondary | ICD-10-CM | POA: Insufficient documentation

## 2019-04-21 DIAGNOSIS — Z8673 Personal history of transient ischemic attack (TIA), and cerebral infarction without residual deficits: Secondary | ICD-10-CM | POA: Insufficient documentation

## 2019-04-21 MED ORDER — OXYCODONE HCL 10 MG PO TABS: 10.0000 mg | ORAL_TABLET | Freq: Three times a day (TID) | ORAL | 0 refills | Status: DC | PRN

## 2019-04-21 NOTE — Progress Notes (Signed)
Subjective:    Patient ID: Katherine [name], female    DOB: 27-Sep-1955, 63 y.o.   MRN: [id number]  HPI: Katherine [name] is a 63 y.o. female whose appointment as changed due to flu symptoms to a tele-health visit, Katherine [name] agrees to a tele-health visit. She  States her  pain is located in her lower back radiating into her bilateral lower extremities, bilateral hip pain and bilateral knee pain. She rates her pain 7. Her current exercise regime is walking.  Katherine [name] Morphine equivalent is 45.00  MME. She is also prescribed Lorazepam by Dr. Jimmy Footman. We have discussed the black box warning of using opioids and benzodiazepines. I highlighted the dangers of using these drugs together and discussed the adverse events including respiratory suppression, overdose, cognitive impairment and importance of compliance with current regimen. We will continue to monitor and adjust as indicated.    Last Oral Swab was Performed on 04/11/2019, it was consistent.   Health History Questions was performed by [place], this provider and [place] [facility] verified we were  speaking with the correct person using two identifiers.  Pain Inventory Average Pain 7 Pain Right Now 7 My pain is constant and aching  In the last 24 hours, has pain interfered with the following? General activity 7 Relation with others 7 Enjoyment of life 10 What TIME of day is your pain at its worst? daytime and evening  Sleep (in general) Fair  Pain is worse with: walking, bending, sitting and some activites Pain improves with: rest, medication and injections Relief from Meds: 5-6  Mobility use a cane use a walker ability to climb steps?  yes do you drive?  yes  Function I need assistance with the following:  meal prep, household duties and shopping  Neuro/Psych weakness numbness trouble walking spasms depression anxiety loss of taste or smell  Prior Studies Any changes since last visit?  no  Physicians  involved in your care Any changes since last visit?  no   Family History  Problem Relation Age of Onset  . Cancer Mother        liver  . Heart disease Father   . Cancer Father        colon  . Arrhythmia Brother        Atrial Fibrillation  . Arrhythmia Paternal Aunt        Atrial Fibrillation   Social History   Socioeconomic History  . Marital status: Married    Spouse name: Not on file  . Number of children: Not on file  . Years of education: Not on file  . Highest education level: Not on file  Occupational History  . Not on file  Social Needs  . Financial resource strain: Not on file  . Food insecurity    Worry: Not on file    Inability: Not on file  . Transportation needs    Medical: Not on file    Non-medical: Not on file  Tobacco Use  . Smoking status: Former Smoker    Packs/day: 1.00    Years: 30.00    Pack years: 30.00    Types: Cigarettes    Quit date: 08/17/2002    Years since quitting: 16.6  . Smokeless tobacco: Never Used  Substance and Sexual Activity  . Alcohol use: No  . Drug use: No  . Sexual activity: Not on file  Lifestyle  . Physical activity    Days per week: Not on file  Minutes per session: Not on file  . Stress: Not on file  Relationships  . Social Herbalist on phone: Not on file    Gets together: Not on file    Attends religious service: Not on file    Active member of club or organization: Not on file    Attends meetings of clubs or organizations: Not on file    Relationship status: Not on file  Other Topics Concern  . Not on file  Social History Narrative   She is currently married, and the caregiver of her husband who is recovering from surgery for tongue cancer now diagnosed with lung cancer. Prior to his diagnosis of her husband, she actually had adopted a 50-year-old child who she knows caring for as well. With all the surgeries, they have been quite financially troubled. Thanks the help of her community and church,  they have been able to stay "alfoat."     She is a former smoker who quit in 2004 after a 30-pack-year history.   She is active chasing a 61-year-old child, does not do routine exercise. She's been quite depressed with the condition of her husband, and admits to eating comfort herself.   She does not drink alcohol.   Past Surgical History:  Procedure Laterality Date  . ABDOMINAL AORTOGRAM N/A 04/21/2018   Procedure: ABDOMINAL AORTOGRAM;  Surgeon: [name], MD;  Location: [facility];  Service: Cardiovascular;  Laterality: N/A;  . CARDIAC CATHETERIZATION  5/'01, 3/'02, 8/'03, 10/'04; 1/'15   08/22/2013: LAD & RCA 100%; LIMA-LAD & SVG-rPDA patent; Cx-- OM1 60%, OM2 ostial ~50%; SVG-D1 - 80% mid, 50% distal ISR --PCI  . CATHETER REMOVAL    . CHOLECYSTECTOMY N/A 10/29/2014   Procedure: LAPAROSCOPIC CHOLECYSTECTOMY WITH INTRAOPERATIVE CHOLANGIOGRAM;  Surgeon: Excell Seltzer, MD;  Location: [facility];  Service: General;  Laterality: N/A;  . CORONARY ANGIOPLASTY  1994   x5  . CORONARY ARTERY BYPASS GRAFT  1995   LIMA-LAD, SVG-RPDA, SVG-D1  . ESOPHAGOGASTRODUODENOSCOPY N/A 10/15/2016   Procedure: ESOPHAGOGASTRODUODENOSCOPY (EGD);  Surgeon: Wilford Corner, MD;  Location[facility];  Service: Endoscopy;  Laterality: N/A;  . I&D EXTREMITY Right 01/29/2018   Procedure: IRRIGATION AND DEBRIDEMENT THUMB;  Surgeon: Dayna Barker, MD;  Location: [facility];  Service: Plastics;  Laterality: Right;  . INCISE AND DRAIN ABCESS    . KIDNEY TRANSPLANT  1991  . KNEE ARTHROSCOPY WITH LATERAL MENISECTOMY Left 12/03/2017   Procedure: LEFT KNEE ARTHROSCOPY WITH LATERAL MENISECTOMY;  Surgeon: Earlie Server, MD;  Location: [facility];  Service: Orthopedics;  Laterality: Left;  . LAPAROSCOPIC GASTRIC BANDING  04/2004; 10/'09, 2/'10   Port Replacement x 2  . LEFT HEART CATH AND CORS/GRAFTS ANGIOGRAPHY N/A 04/21/2018   Procedure: LEFT HEART CATH AND CORS/GRAFTS ANGIOGRAPHY;  Surgeon: [name], MD;  Location: [facility];  Service: Cardiovascular;  Laterality: N/A;  . LEFT HEART CATHETERIZATION WITH CORONARY/GRAFT ANGIOGRAM N/A 08/23/2013   Procedure: LEFT HEART CATHETERIZATION WITH Beatrix Fetters;  Surgeon: Wellington Hampshire, MD;  Location: [facility];  Service: Cardiovascular;  Laterality: N/A;  . Lower Extremity Arterial Dopplers  08/2013   ABI: R 0.96, L 1.04  . MULTIPLE TOOTH EXTRACTIONS  age 24  . NM MYOVIEW LTD  03/2016   EF 62%. LOW RISK. C/W prior MI - no Ischemia. Apical hypokinesis.  [name] Kitchen PERCUTANEOUS CORONARY STENT INTERVENTION (PCI-S)  5/'01, 3/'02, 8/'03, 10/'04;   '01 - S660 BMS 2.5 x 9 - dSVG-D1  into D1; '02- post-stent stenosis - 2.5 x 8 Pixel BMS; '8\03: ISR/Thrombosis into native D1 - AngioJet, 2.5 x 13 Pixel; '04 - ISR 95% - covered stented area with Taxus DES 2.5 mm x 20 (2.88)  . PERCUTANEOUS CORONARY STENT INTERVENTION (PCI-S)  08/23/2013   Procedure: PERCUTANEOUS CORONARY STENT INTERVENTION (PCI-S);  Surgeon: Wellington Hampshire, MD;  Location: [facility] CATH LAB;  Service: Cardiovascular;;mid SVG-D1 80%; distal stent ~50% ISR; Promus Prermier DES 2.75 mm xc 20 mm (2.8 mm)  . PORT-A-CATH REMOVAL     kidney  . TRANSTHORACIC ECHOCARDIOGRAM  02/2016   EF 55-60%. Septal dyssynergy from CABG GR 2 DD. Aortic valve is trileaflet the functional bicuspid with sclerosis but not yet notable for stenosis stenosis.; no Significant change  . TUBAL LIGATION    . wrist fistula repair Left    dialysis for one year   Past Medical History:  Diagnosis Date  . Anemia   . Anxiety   . Bilateral carotid artery stenosis    Carotid duplex 02/8294: 6-21% LICA, 30-86% RICA, >57% RECA, f/u 1 yr suggested  . CAD (coronary artery disease) of bypass graft 5/01; 3/'02, 8/'03, 10/'04; 1/15   PCI x 5 to SVG-D1   . CAD in native artery 07/1993    3 Vessel Disease (LAD-D1 & RCA) -- CABG  . CAD S/P percutaneous coronary angioplasty    PCI to SVG-D1 insertion/native D1 x 4 = '01 -(S660 BMS 2.5 x 9 - insertion  into D1; '02 - distal overlap ACS Pixel 2.5 x 8  BMS; '03 distal/native ISR/Thrombosis - Pixel 2.5 x 13; '04 - ISR-  Taxus 2.5 x 20 (covered all);; 1/15 - mid SVG-D1 (50% distal ISR) - Promus P 2.75 x 20 -- 2.8 mm  . COPD mixed type ([place])    Followed by Dr. Lamonte Sakai "pulmonologist said no COPD"  . Depression   . Depression with anxiety   . Diabetes mellitus type 2 in obese ([place])   . Diarrhea    started after cholecystectomy and mass removed from intestine  . Dyslipidemia, goal LDL below 70    08/2012: TC 137, TG 200, HDL 32!, LDL 45; on statin (followed by Dr.Deterding)  . ESRD (end stage renal disease) ([place]) 1991   s/p Cadaveric Renal Transplant [facility] - Dr. Jimmy Footman)   . Family history of adverse reaction to anesthesia    mom's bp dropped during/after anesthesia  . Fibromyalgia   . GERD (gastroesophageal reflux disease)   . Glomerulonephritis, chronic, rapidly progressive 93  . 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 07/1993   Rescue PTCA of RCA -- referred for CABG.  . H/O: GI bleed   . Headache    migraines in the past  . History of kidney stones   . Hypertension associated with diabetes ([place])   . MRSA (methicillin resistant staph aureus) culture positive   . Obesity    s/p Lap Band 04/2004- Port Replacement 10/09 & 2/10 for infection; Dr. Excell Seltzer.  . OSA (obstructive sleep apnea)    no longer on CPAP or home O2, states she doesn't need now after lap band  . PAD (peripheral artery disease) ([place]) 08/2013   LEA Dopplers to be read by Dr. Fletcher Anon  . PAF (paroxysmal atrial fibrillation) ([place]) 06/2014   Noted on CardioNet Monitor  - rate ~112; no recurrent symptoms. Nonionic regulation because of frequent GI procedures. Patient prefers Plavix  . Pneumonia   . Recurrent boils    Bilateral Groin  .  Rheumatoid arthritis ([place])    Per Patient Report; associated with OA  . 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 Servando Snare: LIMA-LAD, SVG-bifurcatingD1, SVG-rPDA  . S/p  cadaver renal transplant [facility]  . Stroke [facility]) 2012   "right eye stroke- half blind now"  . Unstable angina ([place]) 5/01; 3/'02, 8/'03, 10/'04; 1/15   x 5 occurences since Inf-Post STEMI in 1994   There were no vitals taken for this visit.  Opioid Risk Score:   Fall Risk Score:  `1  Depression screen PHQ 2/9  Depression screen [facility] 2/9 11/02/2018 07/04/2018 03/29/2017  Decreased Interest 3 3 3   Down, Depressed, Hopeless 3 3 3   PHQ - 2 Score 6 6 6   Altered sleeping 1 3 -  Tired, decreased energy 1 3 -  Change in appetite 1 2 -  Feeling bad or failure about yourself  0 3 -  Trouble concentrating 0 2 -  Moving slowly or fidgety/restless 0 0 -  Suicidal thoughts 0 0 -  PHQ-9 Score 9 19 -  Difficult doing work/chores - Very difficult -  Some recent data might be hidden     Review of Systems  Constitutional: Positive for chills.  HENT: Negative.   Eyes: Negative.   Respiratory: Positive for choking.   Cardiovascular: Negative.   Gastrointestinal: Negative.   Endocrine: Negative.   Genitourinary: Negative.   Musculoskeletal: Positive for gait problem.  Skin: Negative.   Allergic/Immunologic: Negative.   Neurological: Positive for dizziness, weakness, numbness and headaches.  Hematological: Negative.   Psychiatric/Behavioral: Negative.        Objective:   Physical Exam Vitals signs and nursing note reviewed.  Musculoskeletal:     Comments: No Physical Exam Performed Virtual Visit  Neurological:     Mental Status: She is oriented to person, place, and time.           Assessment & Plan:  1. Fibromyalgia: Continue with HEP as tolerated. Continue Tegretol and Cymbalta. 04/21/2019 2. Rheumatoid Arthritis: Refilled: Oxycodone 10mg  one tablet every 8 hours as needed for severe pain #60. Continueto Monitor. 04/21/2019 3. Osteoarthritis--polyarticular: Continue with current medication regime and continue to monitor.04/21/2019. 4. ESRD with hx of renal transplant on  chronic steroids: Nephrology Following.04/21/2019. 5. Right rotator cuff tendonitis/like degenerative/inflammatory changes in the right shoulder also. Continue HEP and Continue to Monitor.04/21/2019. 6. Muscle Spasm: Continue Flexeril. 04/21/2019 7. Depression: Continue current medication regimen. Continue to monitor.  04/21/2019. 8. Lumbar Radiculitis S/P  Left 405 Transformanial ESI with relief noted. Continue HEP as Tolerated.Continue to Monitor. 04/21/2019. 9. Bilateral  Hip Pain: Continue HEP alternate Ice and Heat Therapy. 04/21/2019 10. Type 2 DM with Peripheral Neuropathy: Continue Tegretol. 04/21/2019   Location of patient: In her Home Location of provider: Office Established patient Time spent on call: 15 Minutes F/U with Dr Naaman Plummer

## 2019-04-24 ENCOUNTER — Encounter: Payer: Self-pay | Admitting: Registered Nurse

## 2019-04-25 ENCOUNTER — Telehealth: Payer: Self-pay | Admitting: Pharmacist

## 2019-04-25 NOTE — Telephone Encounter (Signed)
Patient called stating her roommate has been throwing up and having diarrhea. Patient does not have any symptoms. Wants to know if she can come to appointment tomorrow. Advised as long as she does not have any symptoms and she or her roommate does not have SOB, cough, changes in taste or smell it is ok for her to come

## 2019-04-29 ENCOUNTER — Other Ambulatory Visit: Payer: Self-pay | Admitting: Cardiology

## 2019-05-02 DIAGNOSIS — N390 Urinary tract infection, site not specified: Secondary | ICD-10-CM | POA: Diagnosis not present

## 2019-05-03 IMAGING — CR DG CHEST 2V
2 series · 2 of 2 positions shown · non-contrast
Comparison: Chest x-ray dated February 03, 2018.

CLINICAL DATA: Chest pain.

EXAM:
CHEST - 2 VIEW

[chest lat]
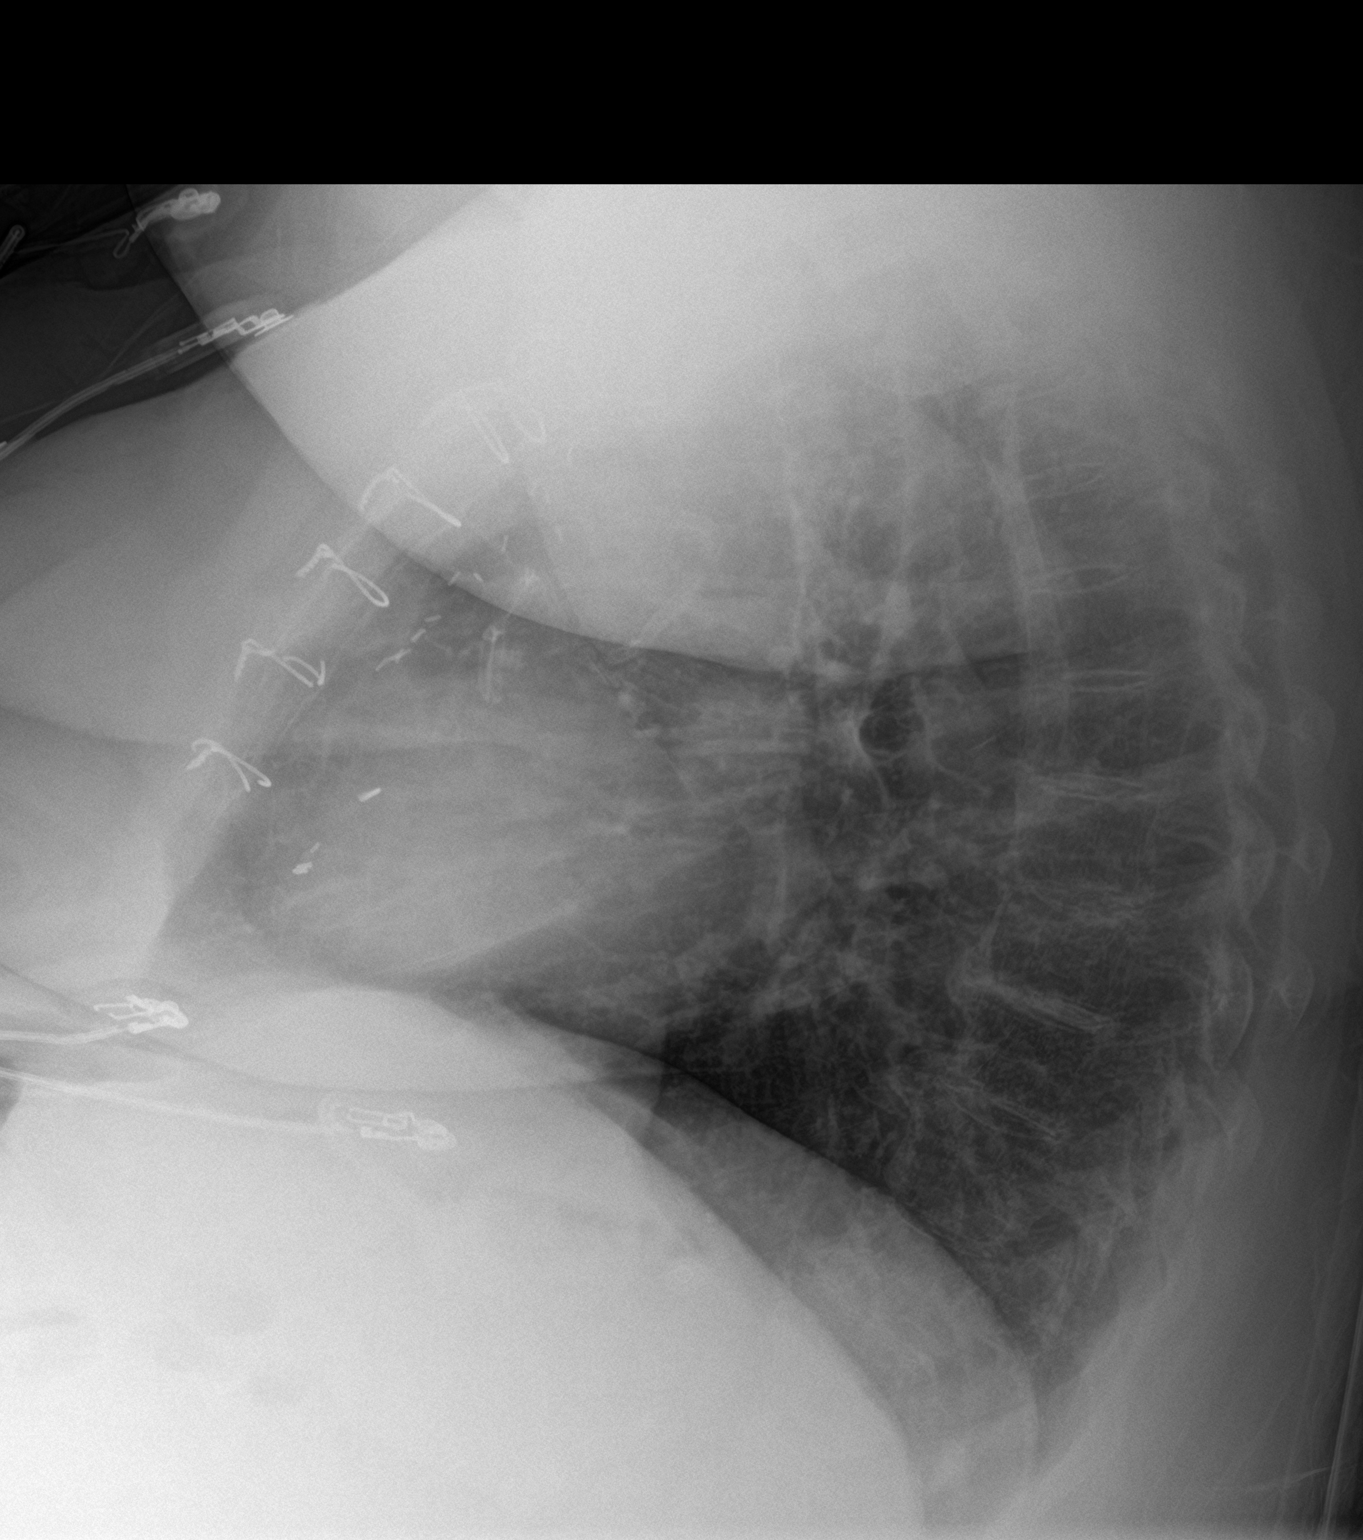

[chest ap]
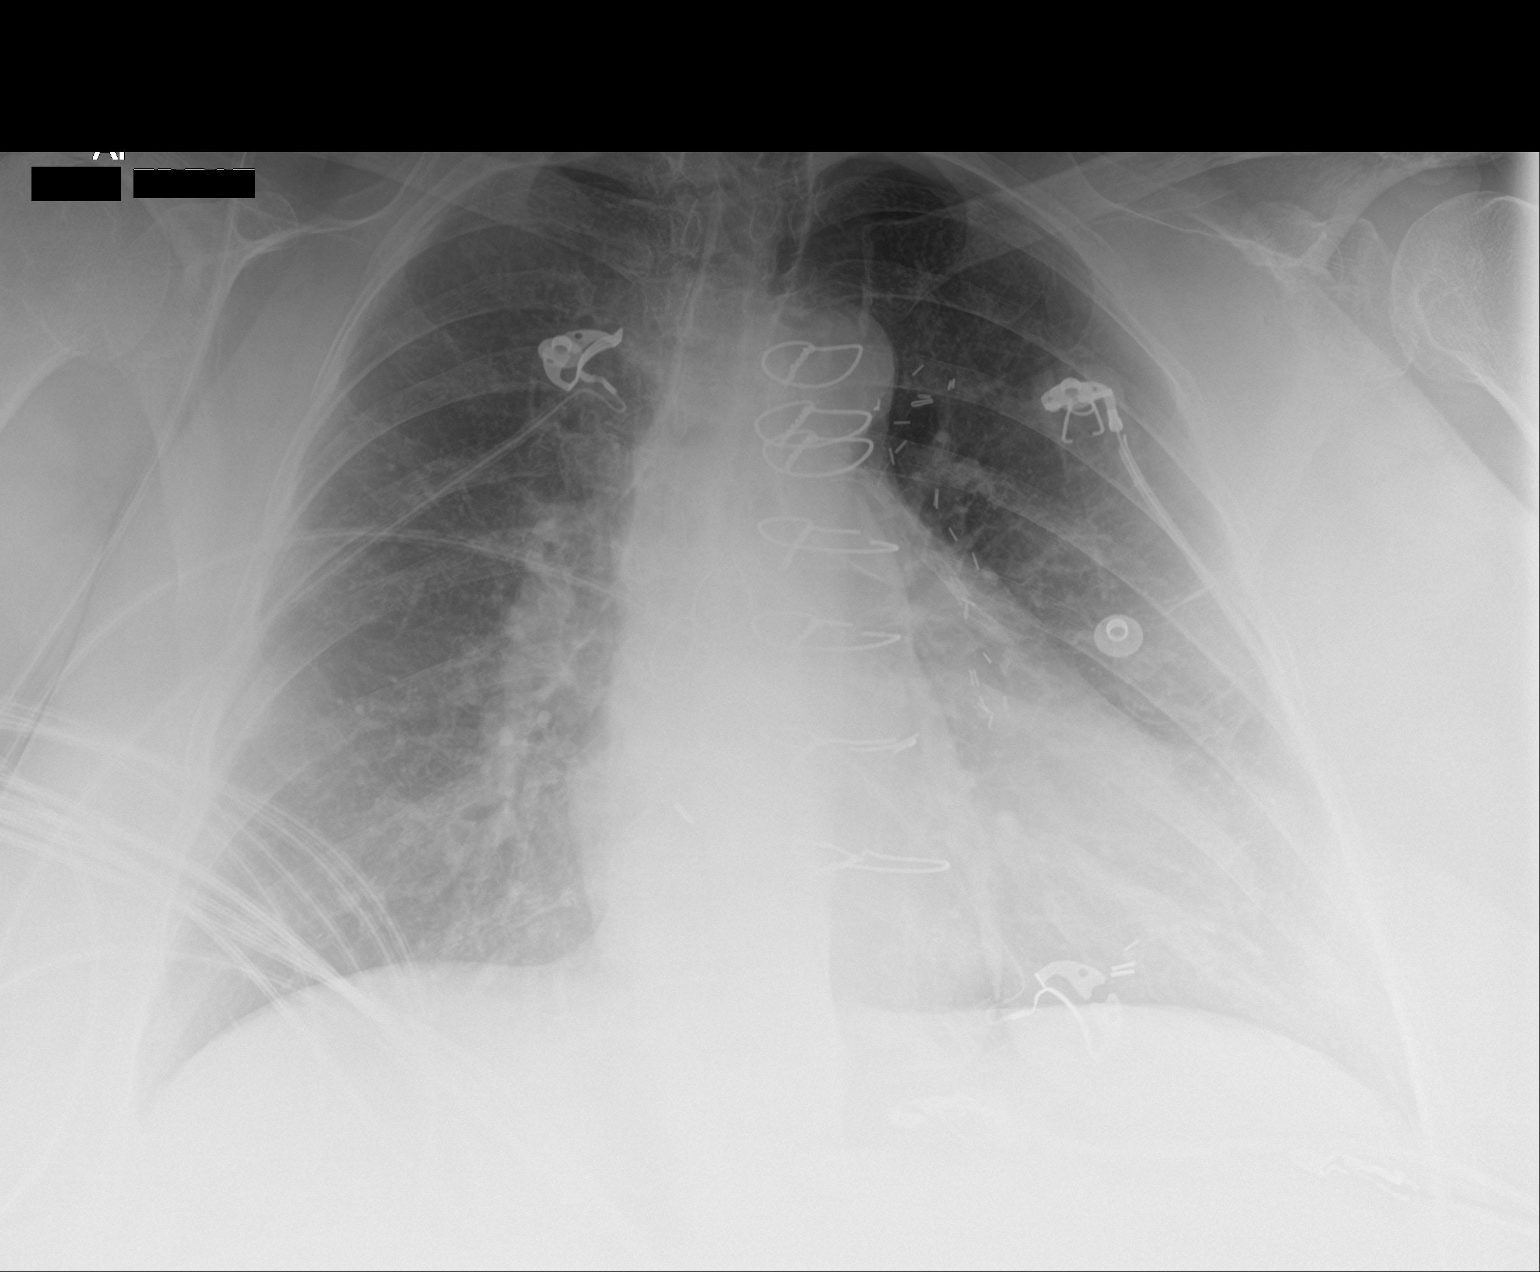

[2 of 2 positions shown; findings below may reference images not displayed]

FINDINGS: The patient is rotated to the left. Stable cardiomegaly status post
CABG. Normal pulmonary vascularity. No focal consolidation, pleural
effusion, or pneumothorax. No acute osseous abnormality. Multiple
broken median sternotomy wires are unchanged.
IMPRESSION: Stable cardiomegaly.  No active cardiopulmonary disease.

## 2019-05-08 ENCOUNTER — Other Ambulatory Visit: Payer: Self-pay | Admitting: Physical Medicine & Rehabilitation

## 2019-05-08 DIAGNOSIS — E1142 Type 2 diabetes mellitus with diabetic polyneuropathy: Secondary | ICD-10-CM

## 2019-05-08 DIAGNOSIS — M797 Fibromyalgia: Secondary | ICD-10-CM

## 2019-05-08 DIAGNOSIS — F329 Major depressive disorder, single episode, unspecified: Secondary | ICD-10-CM

## 2019-05-18 ENCOUNTER — Other Ambulatory Visit: Payer: Self-pay

## 2019-05-18 DIAGNOSIS — I480 Paroxysmal atrial fibrillation: Secondary | ICD-10-CM

## 2019-05-29 ENCOUNTER — Ambulatory Visit (INDEPENDENT_AMBULATORY_CARE_PROVIDER_SITE_OTHER): Payer: Medicare Other | Admitting: Emergency Medicine

## 2019-05-29 ENCOUNTER — Encounter: Payer: Self-pay | Admitting: Emergency Medicine

## 2019-05-29 DIAGNOSIS — K219 Gastro-esophageal reflux disease without esophagitis: Secondary | ICD-10-CM

## 2019-05-29 DIAGNOSIS — J4520 Mild intermittent asthma, uncomplicated: Secondary | ICD-10-CM

## 2019-05-29 DIAGNOSIS — J309 Allergic rhinitis, unspecified: Secondary | ICD-10-CM

## 2019-05-29 NOTE — Assessment & Plan Note (Addendum)
Plan continue current Symbicort, Singulair.  Continue to try to control allergies and GERD to the best of our ability since these are potential exacerbators

## 2019-05-29 NOTE — Assessment & Plan Note (Signed)
With cough, sputum particularly in the morning.  She has a change in her voice quality, some "squeaky voice", inability to sing.  This should be transient if we can improve her upper airway irritation.  Plan to continue her current allergy therapy.  If her voice quality does not return to normal then I would favor an ENT evaluation to visualize her posterior pharynx, glottis. Continue Singulair, Flonase, and Zyrtec during the fall months

## 2019-05-29 NOTE — Patient Instructions (Signed)
We will see you back in 3 months.   We will call you closer to time to schedule this appointment.  If your symptoms worsen, please call our office.

## 2019-05-29 NOTE — Assessment & Plan Note (Signed)
Continue Pepcid  

## 2019-05-29 NOTE — Progress Notes (Signed)
Virtual Visit via Telephone Note  I connected with Katherine [name] on 05/29/19 at  2:00 PM EDT by telephone and verified that I am speaking with the correct person using two identifiers.  Location: Patient: Home  Provider: Office   I discussed the limitations, risks, security and privacy concerns of performing an evaluation and management service by telephone and the availability of in person appointments. I also discussed with the patient that there may be a patient responsible charge related to this service. The patient expressed understanding and agreed to proceed.   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  She also has a history of rheumatoid arthritis, fibromyalgia, depression, diabetes.  She has required frequent treatment for flares of her chronic bronchitis.  Last treated with increased prednisone 03/14/2019.   Observations/Objective: Currently managed on Symbicort 160/4.5, Singulair, Flonase twice daily, Pepcid.  Uses albuterol approximately once a day. She has been dealing with a UTI for last several weeks, has been on doxy, then cipro which she is finishing now. She reports that she has been more SOB, has had morning cough with some dark mucous - about the same as her baseline. The SOB is most noticeable with exertion, but can be present at other times as well.  Flu shot and PNA shot up to date. She has noticed some change in her voice quality, her ability to sing  Assessment and Plan: Extrinsic asthma Plan continue current Symbicort, Singulair.  Continue to try to control allergies and GERD to the best of our ability since these are potential exacerbators   Chronic allergic rhinitis With cough, sputum particularly in the morning.   She has a change in her voice quality, some "squeaky voice", inability to sing.  This should be transient if we can improve her upper airway irritation.  Plan to continue her current allergy therapy.  If her voice quality does not return to normal then I would favor an ENT evaluation to visualize her posterior pharynx, glottis. Continue Singulair, Flonase, and Zyrtec during the fall months  GERD Continue Pepcid    Follow Up Instructions: 3 months   I discussed the assessment and treatment plan with the patient. The patient was provided an opportunity to ask questions and all were answered. The patient agreed with the plan and demonstrated an understanding of the instructions.   The patient was advised to call back or seek an in-person evaluation if the symptoms worsen or if the condition fails to improve as anticipated.  I provided 22 minutes of non-face-to-face time during this encounter.   Collene Gobble, MD

## 2019-05-30 ENCOUNTER — Other Ambulatory Visit: Payer: Self-pay | Admitting: Emergency Medicine

## 2019-06-05 DIAGNOSIS — K591 Functional diarrhea: Secondary | ICD-10-CM | POA: Diagnosis not present

## 2019-06-05 DIAGNOSIS — Z86010 Personal history of colonic polyps: Secondary | ICD-10-CM | POA: Diagnosis not present

## 2019-06-05 DIAGNOSIS — D132 Benign neoplasm of duodenum: Secondary | ICD-10-CM | POA: Diagnosis not present

## 2019-06-05 DIAGNOSIS — Z800 Family history of malignant neoplasm of digestive organs: Secondary | ICD-10-CM | POA: Diagnosis not present

## 2019-06-05 DIAGNOSIS — Z7901 Long term (current) use of anticoagulants: Secondary | ICD-10-CM | POA: Diagnosis not present

## 2019-06-07 ENCOUNTER — Other Ambulatory Visit: Payer: Self-pay | Admitting: *Deleted

## 2019-06-07 ENCOUNTER — Telehealth: Payer: Self-pay | Admitting: *Deleted

## 2019-06-07 ENCOUNTER — Ambulatory Visit
Admission: RE | Admit: 2019-06-07 | Discharge: 2019-06-07 | Disposition: A | Discharge status: Home / Self Care | Payer: Medicare Other | Source: Ambulatory Visit | Attending: Physical Medicine & Rehabilitation | Admitting: Physical Medicine & Rehabilitation

## 2019-06-07 DIAGNOSIS — M160 Bilateral primary osteoarthritis of hip: Secondary | ICD-10-CM | POA: Diagnosis not present

## 2019-06-07 DIAGNOSIS — M25551 Pain in right hip: Secondary | ICD-10-CM

## 2019-06-07 DIAGNOSIS — M797 Fibromyalgia: Secondary | ICD-10-CM

## 2019-06-07 NOTE — Telephone Encounter (Signed)
Patient left a message asking for a 2nd month script for oxycodone.  Next appt is with Dr. Naaman Plummer in November

## 2019-06-08 ENCOUNTER — Ambulatory Visit: Payer: Medicare Other | Admitting: Cardiology

## 2019-06-08 MED ORDER — OXYCODONE HCL 10 MG PO TABS: 10.0000 mg | ORAL_TABLET | Freq: Three times a day (TID) | ORAL | 0 refills | Status: DC | PRN

## 2019-06-08 NOTE — Telephone Encounter (Signed)
Med rx'ed

## 2019-06-13 NOTE — Telephone Encounter (Signed)
Contacted patient and shared x-ray results

## 2019-06-13 NOTE — Telephone Encounter (Signed)
Please let patient know that Pelvis xray reviewed and reveals moderate degenerative disease of back and hips. It is not a great study however.  thx

## 2019-06-21 DIAGNOSIS — E669 Obesity, unspecified: Secondary | ICD-10-CM | POA: Diagnosis not present

## 2019-06-21 DIAGNOSIS — I4891 Unspecified atrial fibrillation: Secondary | ICD-10-CM | POA: Diagnosis not present

## 2019-06-21 DIAGNOSIS — I639 Cerebral infarction, unspecified: Secondary | ICD-10-CM | POA: Diagnosis not present

## 2019-06-21 DIAGNOSIS — I129 Hypertensive chronic kidney disease with stage 1 through stage 4 chronic kidney disease, or unspecified chronic kidney disease: Secondary | ICD-10-CM | POA: Diagnosis not present

## 2019-06-21 DIAGNOSIS — N2581 Secondary hyperparathyroidism of renal origin: Secondary | ICD-10-CM | POA: Diagnosis not present

## 2019-06-21 DIAGNOSIS — E118 Type 2 diabetes mellitus with unspecified complications: Secondary | ICD-10-CM | POA: Diagnosis not present

## 2019-06-21 DIAGNOSIS — D631 Anemia in chronic kidney disease: Secondary | ICD-10-CM | POA: Diagnosis not present

## 2019-06-21 DIAGNOSIS — N182 Chronic kidney disease, stage 2 (mild): Secondary | ICD-10-CM | POA: Diagnosis not present

## 2019-06-21 DIAGNOSIS — Z940 Kidney transplant status: Secondary | ICD-10-CM | POA: Diagnosis not present

## 2019-06-21 DIAGNOSIS — I2510 Atherosclerotic heart disease of native coronary artery without angina pectoris: Secondary | ICD-10-CM | POA: Diagnosis not present

## 2019-06-21 DIAGNOSIS — I739 Peripheral vascular disease, unspecified: Secondary | ICD-10-CM | POA: Diagnosis not present

## 2019-06-21 DIAGNOSIS — E785 Hyperlipidemia, unspecified: Secondary | ICD-10-CM | POA: Diagnosis not present

## 2019-06-22 ENCOUNTER — Other Ambulatory Visit: Payer: Self-pay | Admitting: Cardiology

## 2019-06-28 ENCOUNTER — Encounter: Payer: Medicare Other | Admitting: Physical Medicine & Rehabilitation

## 2019-06-28 ENCOUNTER — Telehealth: Payer: Self-pay | Admitting: Cardiology

## 2019-06-28 NOTE — Telephone Encounter (Signed)
Patient is requesting for her niece to come with her to her appt on 11/16, as she has fibromyalgia and has issues with her hips.  Her niece helps her with walking.

## 2019-06-28 NOTE — Telephone Encounter (Signed)
Left detailed message with COVID/ visitor restrictions. No visitors in office niece can bring up in the wheelchair but not into office.

## 2019-07-03 ENCOUNTER — Ambulatory Visit: Payer: Medicare Other | Admitting: Cardiology

## 2019-07-05 ENCOUNTER — Telehealth: Payer: Self-pay | Admitting: Emergency Medicine

## 2019-07-05 DIAGNOSIS — Z20828 Contact with and (suspected) exposure to other viral communicable diseases: Secondary | ICD-10-CM | POA: Diagnosis not present

## 2019-07-05 DIAGNOSIS — J441 Chronic obstructive pulmonary disease with (acute) exacerbation: Secondary | ICD-10-CM | POA: Diagnosis not present

## 2019-07-05 NOTE — Telephone Encounter (Signed)
Left message for patient to call back  

## 2019-07-06 NOTE — Telephone Encounter (Signed)
Spoke with pt. States that she is having some symptoms of COVID. She feels like it's just her allergies acting up but she did go to be tested yesterday. Pt states that she will let us know what the results are when she gets them. Nothing further was needed at this time.

## 2019-07-12 ENCOUNTER — Telehealth: Payer: Self-pay | Admitting: *Deleted

## 2019-07-12 ENCOUNTER — Other Ambulatory Visit: Payer: Self-pay | Admitting: Gastroenterology

## 2019-07-12 DIAGNOSIS — M797 Fibromyalgia: Secondary | ICD-10-CM

## 2019-07-12 DIAGNOSIS — M47816 Spondylosis without myelopathy or radiculopathy, lumbar region: Secondary | ICD-10-CM

## 2019-07-12 MED ORDER — CARBAMAZEPINE 200 MG PO TABS: 200.0000 mg | ORAL_TABLET | Freq: Two times a day (BID) | ORAL | 0 refills | Status: DC

## 2019-07-12 NOTE — Telephone Encounter (Signed)
Deanna called from [facility] called because Katherine [name]'s pharmacy had requested refill of tegretol.  I told her we would send in the refills to the pharmacy.

## 2019-07-18 DIAGNOSIS — I350 Nonrheumatic aortic (valve) stenosis: Secondary | ICD-10-CM

## 2019-07-18 HISTORY — PX: TRANSTHORACIC ECHOCARDIOGRAM: SHX275

## 2019-07-18 HISTORY — DX: Nonrheumatic aortic (valve) stenosis: I35.0

## 2019-07-18 IMAGING — DX DG CHEST 2V
2 series · 2 of 2 positions shown · non-contrast
Comparison: PA and lateral chest x-ray April 19, 2018

CLINICAL DATA: Cough, chest congestion, shortness of breath for the
past 2 weeks. History of COPD, coronary artery disease, diabetes,
former smoker. Previous CABG.

EXAM:
CHEST - 2 VIEW

[chest pa]
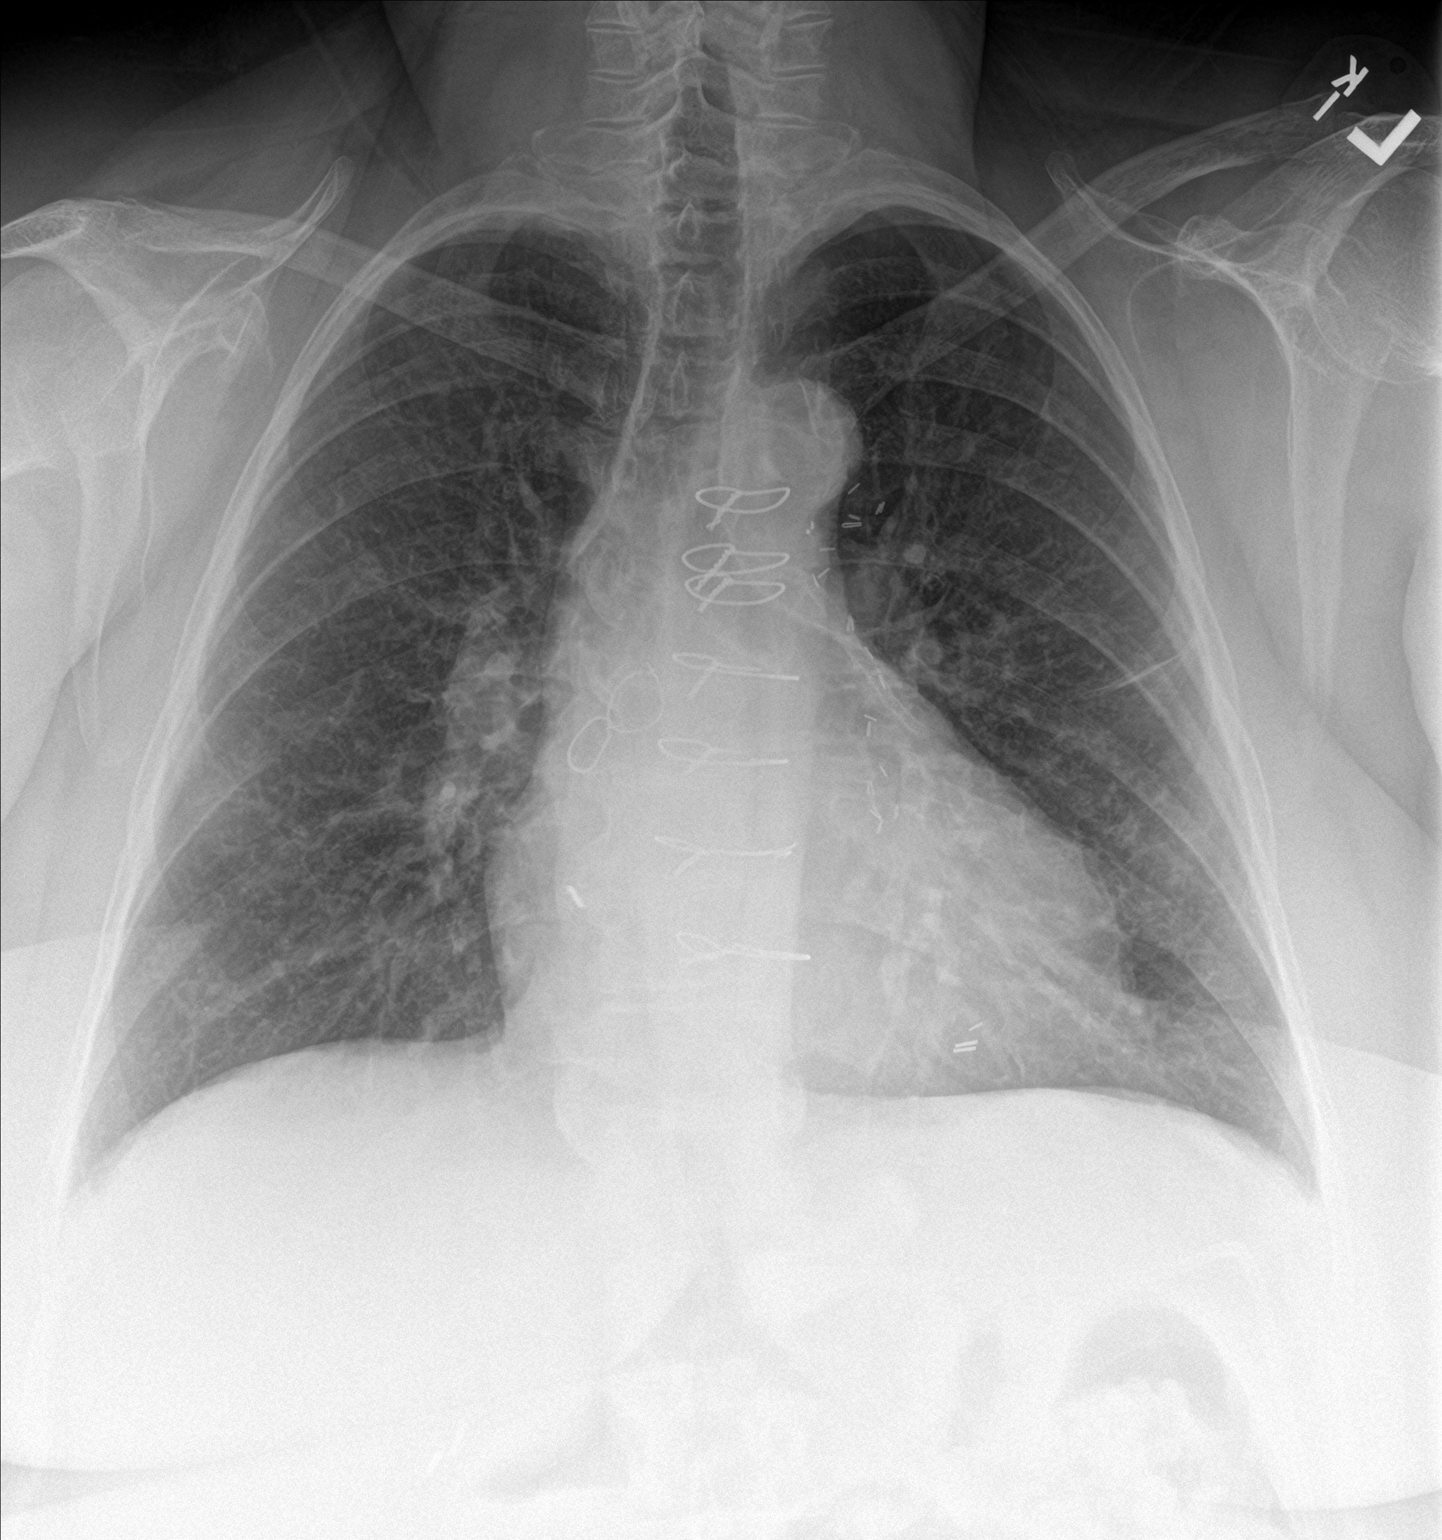

[chest lat]
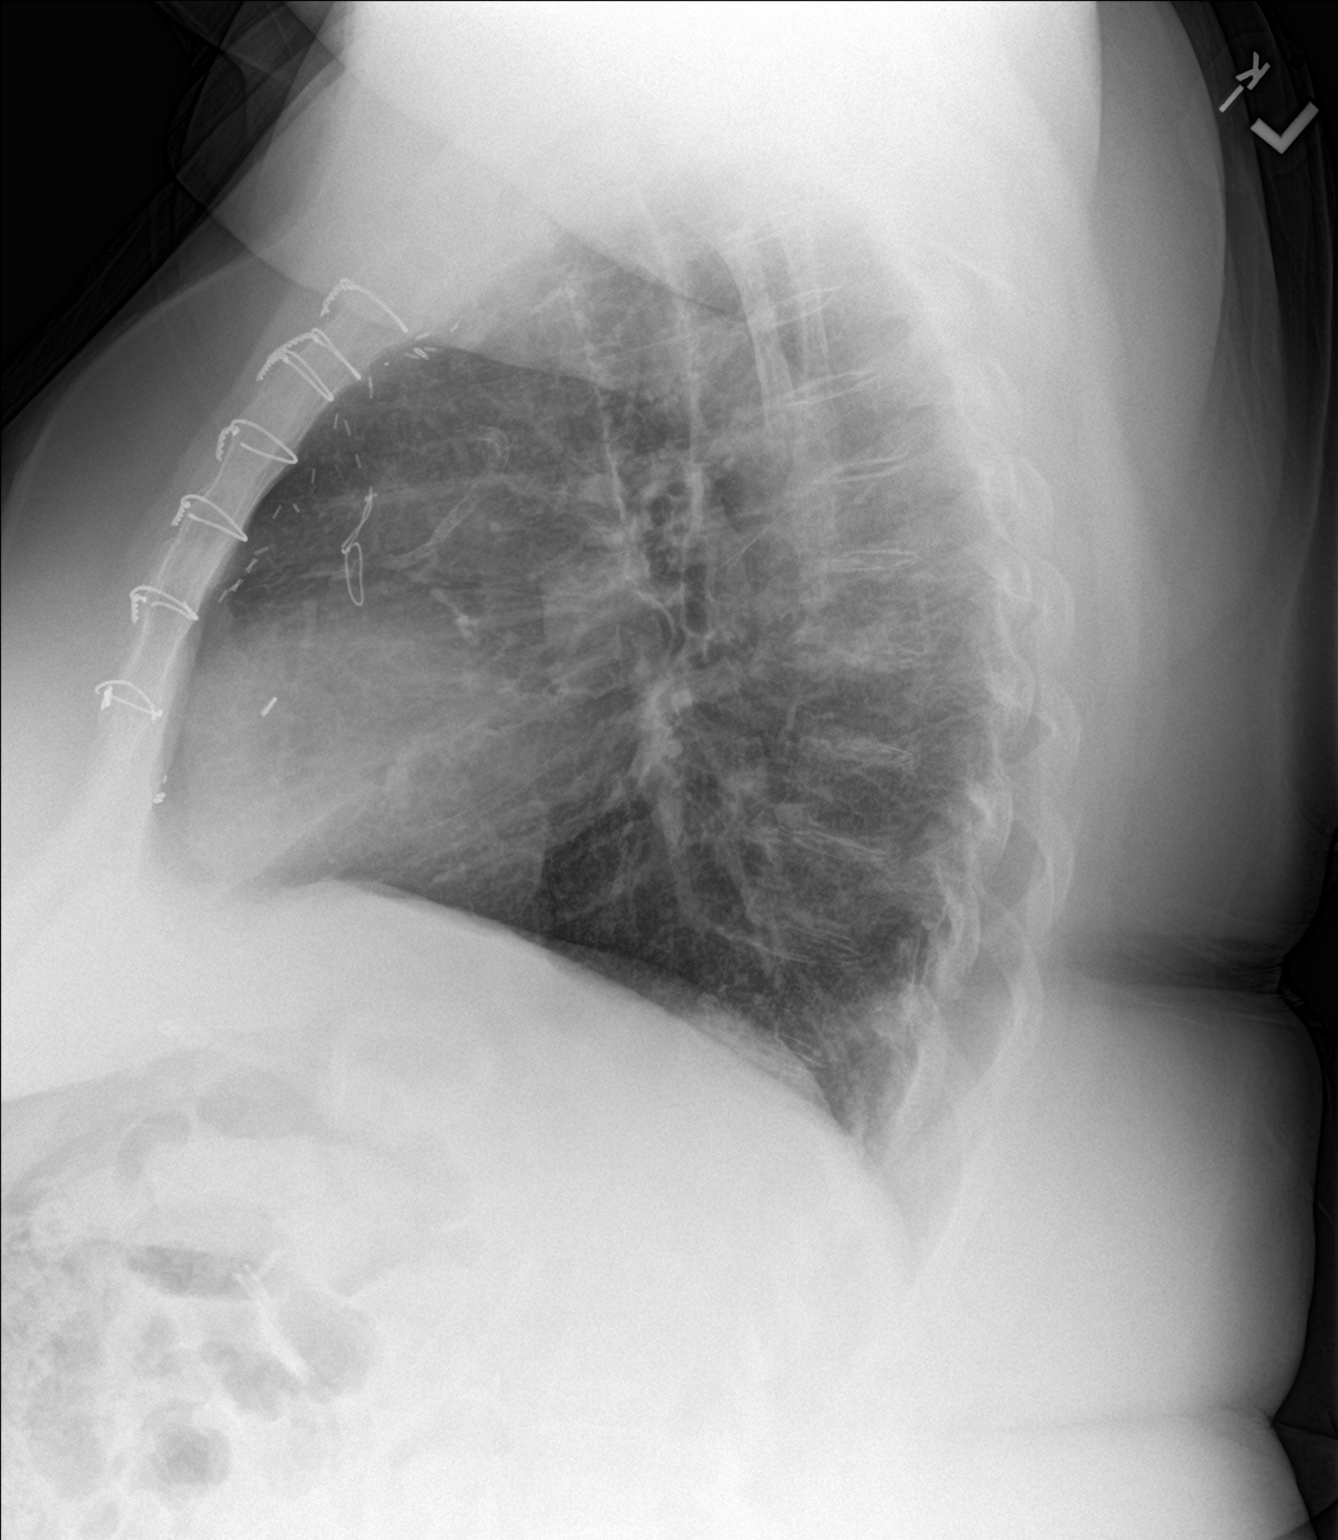

[2 of 2 positions shown; findings below may reference images not displayed]

FINDINGS: The lungs are adequately inflated. There is no focal infiltrate. The
interstitial markings are coarse but stable. There is no pleural
effusion. The heart is top-normal in size. The pulmonary vascularity
is not engorged. There are post CABG and coronary stent placement
changes. There is multilevel degenerative disc disease of the
thoracic spine.
IMPRESSION: Chronic bronchitic changes. No alveolar pneumonia nor CHF. One
cannot exclude superimposed acute bronchitis in the appropriate
clinical setting.

Previous CABG and coronary artery stent placement.

## 2019-07-23 ENCOUNTER — Other Ambulatory Visit: Payer: Self-pay

## 2019-07-23 ENCOUNTER — Inpatient Hospital Stay (HOSPITAL_COMMUNITY)
Admission: EM | Admit: 2019-07-23 | Discharge: 2019-07-28 | DRG: 202 | Disposition: A | Discharge status: Home Health | Payer: Medicare Other | Attending: Internal Medicine | Admitting: Internal Medicine

## 2019-07-23 ENCOUNTER — Emergency Department (HOSPITAL_COMMUNITY): Payer: Medicare Other

## 2019-07-23 ENCOUNTER — Encounter (HOSPITAL_COMMUNITY): Payer: Self-pay | Admitting: Emergency Medicine

## 2019-07-23 DIAGNOSIS — G4733 Obstructive sleep apnea (adult) (pediatric): Secondary | ICD-10-CM | POA: Diagnosis present

## 2019-07-23 DIAGNOSIS — I69398 Other sequelae of cerebral infarction: Secondary | ICD-10-CM

## 2019-07-23 DIAGNOSIS — E8809 Other disorders of plasma-protein metabolism, not elsewhere classified: Secondary | ICD-10-CM | POA: Diagnosis present

## 2019-07-23 DIAGNOSIS — J111 Influenza due to unidentified influenza virus with other respiratory manifestations: Secondary | ICD-10-CM

## 2019-07-23 DIAGNOSIS — H538 Other visual disturbances: Secondary | ICD-10-CM | POA: Diagnosis present

## 2019-07-23 DIAGNOSIS — Z7952 Long term (current) use of systemic steroids: Secondary | ICD-10-CM

## 2019-07-23 DIAGNOSIS — E785 Hyperlipidemia, unspecified: Secondary | ICD-10-CM | POA: Diagnosis present

## 2019-07-23 DIAGNOSIS — M797 Fibromyalgia: Secondary | ICD-10-CM | POA: Diagnosis present

## 2019-07-23 DIAGNOSIS — Z6841 Body Mass Index (BMI) 40.0 and over, adult: Secondary | ICD-10-CM

## 2019-07-23 DIAGNOSIS — J9602 Acute respiratory failure with hypercapnia: Secondary | ICD-10-CM

## 2019-07-23 DIAGNOSIS — I447 Left bundle-branch block, unspecified: Secondary | ICD-10-CM | POA: Diagnosis present

## 2019-07-23 DIAGNOSIS — Z20828 Contact with and (suspected) exposure to other viral communicable diseases: Secondary | ICD-10-CM | POA: Diagnosis present

## 2019-07-23 DIAGNOSIS — R791 Abnormal coagulation profile: Secondary | ICD-10-CM | POA: Diagnosis not present

## 2019-07-23 DIAGNOSIS — R079 Chest pain, unspecified: Secondary | ICD-10-CM | POA: Diagnosis not present

## 2019-07-23 DIAGNOSIS — Z87891 Personal history of nicotine dependence: Secondary | ICD-10-CM

## 2019-07-23 DIAGNOSIS — Z7951 Long term (current) use of inhaled steroids: Secondary | ICD-10-CM

## 2019-07-23 DIAGNOSIS — I2510 Atherosclerotic heart disease of native coronary artery without angina pectoris: Secondary | ICD-10-CM | POA: Diagnosis present

## 2019-07-23 DIAGNOSIS — I5032 Chronic diastolic (congestive) heart failure: Secondary | ICD-10-CM | POA: Diagnosis present

## 2019-07-23 DIAGNOSIS — Z79899 Other long term (current) drug therapy: Secondary | ICD-10-CM

## 2019-07-23 DIAGNOSIS — I132 Hypertensive heart and chronic kidney disease with heart failure and with stage 5 chronic kidney disease, or end stage renal disease: Secondary | ICD-10-CM | POA: Diagnosis present

## 2019-07-23 DIAGNOSIS — R531 Weakness: Secondary | ICD-10-CM | POA: Diagnosis not present

## 2019-07-23 DIAGNOSIS — E1151 Type 2 diabetes mellitus with diabetic peripheral angiopathy without gangrene: Secondary | ICD-10-CM | POA: Diagnosis present

## 2019-07-23 DIAGNOSIS — Z7901 Long term (current) use of anticoagulants: Secondary | ICD-10-CM

## 2019-07-23 DIAGNOSIS — Z800 Family history of malignant neoplasm of digestive organs: Secondary | ICD-10-CM

## 2019-07-23 DIAGNOSIS — M069 Rheumatoid arthritis, unspecified: Secondary | ICD-10-CM | POA: Diagnosis present

## 2019-07-23 DIAGNOSIS — Z885 Allergy status to narcotic agent status: Secondary | ICD-10-CM

## 2019-07-23 DIAGNOSIS — R05 Cough: Secondary | ICD-10-CM

## 2019-07-23 DIAGNOSIS — J9601 Acute respiratory failure with hypoxia: Secondary | ICD-10-CM | POA: Diagnosis present

## 2019-07-23 DIAGNOSIS — I480 Paroxysmal atrial fibrillation: Secondary | ICD-10-CM | POA: Diagnosis present

## 2019-07-23 DIAGNOSIS — Z8614 Personal history of Methicillin resistant Staphylococcus aureus infection: Secondary | ICD-10-CM

## 2019-07-23 DIAGNOSIS — Z7902 Long term (current) use of antithrombotics/antiplatelets: Secondary | ICD-10-CM

## 2019-07-23 DIAGNOSIS — I10 Essential (primary) hypertension: Secondary | ICD-10-CM | POA: Diagnosis not present

## 2019-07-23 DIAGNOSIS — R059 Cough, unspecified: Secondary | ICD-10-CM

## 2019-07-23 DIAGNOSIS — K219 Gastro-esophageal reflux disease without esophagitis: Secondary | ICD-10-CM | POA: Diagnosis present

## 2019-07-23 DIAGNOSIS — Z8249 Family history of ischemic heart disease and other diseases of the circulatory system: Secondary | ICD-10-CM

## 2019-07-23 DIAGNOSIS — I252 Old myocardial infarction: Secondary | ICD-10-CM

## 2019-07-23 DIAGNOSIS — I482 Chronic atrial fibrillation, unspecified: Secondary | ICD-10-CM | POA: Diagnosis present

## 2019-07-23 DIAGNOSIS — G8929 Other chronic pain: Secondary | ICD-10-CM | POA: Diagnosis present

## 2019-07-23 DIAGNOSIS — N186 End stage renal disease: Secondary | ICD-10-CM | POA: Diagnosis present

## 2019-07-23 DIAGNOSIS — Z940 Kidney transplant status: Secondary | ICD-10-CM | POA: Diagnosis not present

## 2019-07-23 DIAGNOSIS — Z9861 Coronary angioplasty status: Secondary | ICD-10-CM

## 2019-07-23 DIAGNOSIS — Z951 Presence of aortocoronary bypass graft: Secondary | ICD-10-CM

## 2019-07-23 DIAGNOSIS — Z91048 Other nonmedicinal substance allergy status: Secondary | ICD-10-CM

## 2019-07-23 DIAGNOSIS — J45909 Unspecified asthma, uncomplicated: Secondary | ICD-10-CM | POA: Diagnosis present

## 2019-07-23 DIAGNOSIS — R0602 Shortness of breath: Secondary | ICD-10-CM | POA: Diagnosis not present

## 2019-07-23 DIAGNOSIS — J449 Chronic obstructive pulmonary disease, unspecified: Secondary | ICD-10-CM | POA: Diagnosis present

## 2019-07-23 DIAGNOSIS — J45901 Unspecified asthma with (acute) exacerbation: Principal | ICD-10-CM | POA: Diagnosis present

## 2019-07-23 DIAGNOSIS — G43909 Migraine, unspecified, not intractable, without status migrainosus: Secondary | ICD-10-CM | POA: Diagnosis present

## 2019-07-23 DIAGNOSIS — I152 Hypertension secondary to endocrine disorders: Secondary | ICD-10-CM | POA: Diagnosis present

## 2019-07-23 DIAGNOSIS — Z794 Long term (current) use of insulin: Secondary | ICD-10-CM

## 2019-07-23 DIAGNOSIS — Z881 Allergy status to other antibiotic agents status: Secondary | ICD-10-CM

## 2019-07-23 DIAGNOSIS — Z882 Allergy status to sulfonamides status: Secondary | ICD-10-CM

## 2019-07-23 HISTORY — DX: Morbid (severe) obesity due to excess calories: E66.01

## 2019-07-23 LAB — CBC WITH DIFFERENTIAL/PLATELET
Abs Immature Granulocytes: 0.04 10*3/uL (ref 0.00–0.07)
Abs Immature Granulocytes: 0.04 K/uL (ref 0.00–0.07)
Basophils Absolute: 0 10*3/uL (ref 0.0–0.1)
Basophils Absolute: 0 K/uL (ref 0.0–0.1)
Basophils Relative: 0 %
Basophils Relative: 0 %
Eosinophils Absolute: 0.1 10*3/uL (ref 0.0–0.5)
Eosinophils Absolute: 0.1 K/uL (ref 0.0–0.5)
Eosinophils Relative: 1 %
Eosinophils Relative: 1 %
HCT: 31.9 % — ABNORMAL LOW (ref 36.0–46.0)
HCT: 31.1 % — ABNORMAL LOW (ref 36.0–46.0)
Hemoglobin: 10.1 g/dL — ABNORMAL LOW (ref 12.0–15.0)
Hemoglobin: 9.7 g/dL — ABNORMAL LOW (ref 12.0–15.0)
Immature Granulocytes: 0 %
Immature Granulocytes: 0 %
Lymphocytes Relative: 10 %
Lymphocytes Relative: 9 %
Lymphs Abs: 1 10*3/uL (ref 0.7–4.0)
Lymphs Abs: 1 K/uL (ref 0.7–4.0)
MCH: 31.6 pg (ref 26.0–34.0)
MCH: 31.1 pg (ref 26.0–34.0)
MCHC: 31.7 g/dL (ref 30.0–36.0)
MCHC: 31.2 g/dL (ref 30.0–36.0)
MCV: 99.7 fL (ref 80.0–100.0)
MCV: 99.7 fL (ref 80.0–100.0)
Monocytes Absolute: 1 10*3/uL (ref 0.1–1.0)
Monocytes Absolute: 1.2 K/uL — ABNORMAL HIGH (ref 0.1–1.0)
Monocytes Relative: 10 %
Monocytes Relative: 11 %
Neutro Abs: 8.2 10*3/uL — ABNORMAL HIGH (ref 1.7–7.7)
Neutro Abs: 8.9 K/uL — ABNORMAL HIGH (ref 1.7–7.7)
Neutrophils Relative %: 79 %
Neutrophils Relative %: 79 %
Platelets: 246 10*3/uL (ref 150–400)
Platelets: 217 K/uL (ref 150–400)
RBC: 3.2 MIL/uL — ABNORMAL LOW (ref 3.87–5.11)
RBC: 3.12 MIL/uL — ABNORMAL LOW (ref 3.87–5.11)
RDW: 17.2 % — ABNORMAL HIGH (ref 11.5–15.5)
RDW: 17.3 % — ABNORMAL HIGH (ref 11.5–15.5)
WBC: 10.4 10*3/uL (ref 4.0–10.5)
WBC: 11.2 K/uL — ABNORMAL HIGH (ref 4.0–10.5)
nRBC: 0 % (ref 0.0–0.2)
nRBC: 0 % (ref 0.0–0.2)

## 2019-07-23 LAB — BASIC METABOLIC PANEL
Anion gap: 10 (ref 5–15)
BUN: 8 mg/dL (ref 8–23)
CO2: 23 mmol/L (ref 22–32)
Calcium: 8.7 mg/dL — ABNORMAL LOW (ref 8.9–10.3)
Chloride: 101 mmol/L (ref 98–111)
Creatinine, Ser: 0.95 mg/dL (ref 0.44–1.00)
GFR calc Af Amer: >60 mL/min (ref >60)
GFR calc non Af Amer: >60 mL/min (ref >60)
Glucose, Bld: 114 mg/dL — ABNORMAL HIGH (ref 70–99)
Potassium: 4.1 mmol/L (ref 3.5–5.1)
Sodium: 134 mmol/L — ABNORMAL LOW (ref 135–145)

## 2019-07-23 LAB — COMPREHENSIVE METABOLIC PANEL
ALT: 10 U/L (ref 0–44)
AST: 17 U/L (ref 15–41)
Albumin: 2.8 g/dL — ABNORMAL LOW (ref 3.5–5.0)
Alkaline Phosphatase: 112 U/L (ref 38–126)
Anion gap: 9 (ref 5–15)
BUN: 10 mg/dL (ref 8–23)
CO2: 24 mmol/L (ref 22–32)
Calcium: 8.9 mg/dL (ref 8.9–10.3)
Chloride: 102 mmol/L (ref 98–111)
Creatinine, Ser: 0.96 mg/dL (ref 0.44–1.00)
GFR calc Af Amer: >60 mL/min (ref >60)
GFR calc non Af Amer: >60 mL/min (ref >60)
Glucose, Bld: 159 mg/dL — ABNORMAL HIGH (ref 70–99)
Potassium: 4.1 mmol/L (ref 3.5–5.1)
Sodium: 135 mmol/L (ref 135–145)
Total Bilirubin: 0.5 mg/dL (ref 0.3–1.2)
Total Protein: 6.6 g/dL (ref 6.5–8.1)

## 2019-07-23 LAB — URINALYSIS, ROUTINE W REFLEX MICROSCOPIC
Bilirubin Urine: NEGATIVE
Glucose, UA: NEGATIVE mg/dL
Hgb urine dipstick: NEGATIVE
Ketones, ur: NEGATIVE mg/dL
Leukocytes,Ua: NEGATIVE
Nitrite: NEGATIVE
Protein, ur: NEGATIVE mg/dL
Specific Gravity, Urine: 1.019 (ref 1.005–1.030)
pH: 6 (ref 5.0–8.0)

## 2019-07-23 LAB — PROTIME-INR
INR: 3.6 — ABNORMAL HIGH (ref 0.8–1.2)
Prothrombin Time: 36.2 seconds — ABNORMAL HIGH (ref 11.4–15.2)

## 2019-07-23 LAB — GLUCOSE, CAPILLARY
Glucose-Capillary: 109 mg/dL — ABNORMAL HIGH (ref 70–99)
Glucose-Capillary: 104 mg/dL — ABNORMAL HIGH (ref 70–99)

## 2019-07-23 LAB — TROPONIN I (HIGH SENSITIVITY)
Troponin I (High Sensitivity): 24 ng/L — ABNORMAL HIGH (ref <18)
Troponin I (High Sensitivity): 28 ng/L — ABNORMAL HIGH (ref <18)
Troponin I (High Sensitivity): 30 ng/L — ABNORMAL HIGH (ref <18)

## 2019-07-23 LAB — INFLUENZA PANEL BY PCR (TYPE A & B)
Influenza A By PCR: NEGATIVE
Influenza B By PCR: NEGATIVE

## 2019-07-23 LAB — POC SARS CORONAVIRUS 2 AG -  ED: SARS Coronavirus 2 Ag: NEGATIVE

## 2019-07-23 LAB — BRAIN NATRIURETIC PEPTIDE: B Natriuretic Peptide: 585.6 pg/mL — ABNORMAL HIGH (ref 0.0–100.0)

## 2019-07-23 LAB — LACTIC ACID, PLASMA
Lactic Acid, Venous: 1.6 mmol/L (ref 0.5–1.9)
Lactic Acid, Venous: 1.2 mmol/L (ref 0.5–1.9)

## 2019-07-23 LAB — HEMOGLOBIN A1C
Hgb A1c MFr Bld: 6.6 % — ABNORMAL HIGH (ref 4.8–5.6)
Mean Plasma Glucose: 142.72 mg/dL

## 2019-07-23 LAB — PHOSPHORUS: Phosphorus: 3.9 mg/dL (ref 2.5–4.6)

## 2019-07-23 LAB — SARS CORONAVIRUS 2 (TAT 6-24 HRS): SARS Coronavirus 2: NEGATIVE

## 2019-07-23 LAB — PROCALCITONIN: Procalcitonin: 0.45 ng/mL

## 2019-07-23 LAB — HIV ANTIBODY (ROUTINE TESTING W REFLEX): HIV Screen 4th Generation wRfx: NONREACTIVE

## 2019-07-23 LAB — MAGNESIUM: Magnesium: 1.5 mg/dL — ABNORMAL LOW (ref 1.7–2.4)

## 2019-07-23 MED ORDER — ONDANSETRON HCL 4 MG/2ML IJ SOLN
4.0000 mg | Freq: Once | INTRAMUSCULAR | Status: AC
  Administered 2019-07-23: 4 mg via INTRAVENOUS
  Filled 2019-07-23: qty 2

## 2019-07-23 MED ORDER — AZATHIOPRINE 50 MG PO TABS
125.0000 mg | ORAL_TABLET | Freq: Every evening | ORAL | Status: DC
  Administered 2019-07-23 – 2019-07-27 (×5): 125 mg via ORAL
  Filled 2019-07-23 (×6): qty 3

## 2019-07-23 MED ORDER — INSULIN ASPART 100 UNIT/ML ~~LOC~~ SOLN
0.0000 [IU] | Freq: Three times a day (TID) | SUBCUTANEOUS | Status: DC
  Administered 2019-07-24 (×2): 3 [IU] via SUBCUTANEOUS
  Administered 2019-07-24: 4 [IU] via SUBCUTANEOUS
  Administered 2019-07-25: 11 [IU] via SUBCUTANEOUS
  Administered 2019-07-25 (×2): 4 [IU] via SUBCUTANEOUS
  Administered 2019-07-26: 7 [IU] via SUBCUTANEOUS
  Administered 2019-07-26: 3 [IU] via SUBCUTANEOUS
  Administered 2019-07-26: 11 [IU] via SUBCUTANEOUS
  Administered 2019-07-27: 4 [IU] via SUBCUTANEOUS
  Administered 2019-07-27: 15 [IU] via SUBCUTANEOUS
  Administered 2019-07-28: 3 [IU] via SUBCUTANEOUS
  Administered 2019-07-28: 11 [IU] via SUBCUTANEOUS

## 2019-07-23 MED ORDER — METOPROLOL TARTRATE 25 MG PO TABS
25.0000 mg | ORAL_TABLET | Freq: Two times a day (BID) | ORAL | Status: DC
  Administered 2019-07-23 – 2019-07-28 (×10): 25 mg via ORAL
  Filled 2019-07-23 (×10): qty 1

## 2019-07-23 MED ORDER — DOFETILIDE 250 MCG PO CAPS
250.0000 ug | ORAL_CAPSULE | Freq: Two times a day (BID) | ORAL | Status: DC
  Administered 2019-07-23 – 2019-07-28 (×9): 250 ug via ORAL
  Filled 2019-07-23 (×11): qty 1

## 2019-07-23 MED ORDER — POLYETHYLENE GLYCOL 3350 17 G PO PACK: 17.0000 g | PACK | Freq: Every day | ORAL | Status: DC | PRN

## 2019-07-23 MED ORDER — DILTIAZEM HCL ER COATED BEADS 120 MG PO CP24
120.0000 mg | ORAL_CAPSULE | Freq: Every day | ORAL | Status: DC
  Administered 2019-07-23 – 2019-07-28 (×6): 120 mg via ORAL
  Filled 2019-07-23 (×6): qty 1

## 2019-07-23 MED ORDER — PANTOPRAZOLE SODIUM 40 MG PO TBEC
40.0000 mg | DELAYED_RELEASE_TABLET | Freq: Every day | ORAL | Status: DC
  Administered 2019-07-23 – 2019-07-28 (×6): 40 mg via ORAL
  Filled 2019-07-23 (×6): qty 1

## 2019-07-23 MED ORDER — MAGNESIUM OXIDE 400 (241.3 MG) MG PO TABS
400.0000 mg | ORAL_TABLET | Freq: Once | ORAL | Status: AC
  Administered 2019-07-23: 400 mg via ORAL
  Filled 2019-07-23: qty 1

## 2019-07-23 MED ORDER — WARFARIN SODIUM 10 MG PO TABS
10.0000 mg | ORAL_TABLET | Freq: Once | ORAL | Status: AC
  Administered 2019-07-23: 10 mg via ORAL
  Filled 2019-07-23: qty 1

## 2019-07-23 MED ORDER — DULOXETINE HCL 60 MG PO CPEP
60.0000 mg | ORAL_CAPSULE | Freq: Every day | ORAL | Status: DC
  Administered 2019-07-23 – 2019-07-28 (×6): 60 mg via ORAL
  Filled 2019-07-23 (×6): qty 1

## 2019-07-23 MED ORDER — PREDNISONE 20 MG PO TABS
30.0000 mg | ORAL_TABLET | Freq: Every day | ORAL | Status: AC
  Administered 2019-07-23 – 2019-07-27 (×5): 30 mg via ORAL
  Filled 2019-07-23 (×5): qty 1

## 2019-07-23 MED ORDER — MONTELUKAST SODIUM 10 MG PO TABS
10.0000 mg | ORAL_TABLET | Freq: Every day | ORAL | Status: DC
  Administered 2019-07-23 – 2019-07-28 (×6): 10 mg via ORAL
  Filled 2019-07-23 (×6): qty 1

## 2019-07-23 MED ORDER — WARFARIN - PHYSICIAN DOSING INPATIENT: Freq: Every day | Status: DC

## 2019-07-23 MED ORDER — ACETAMINOPHEN 500 MG PO TABS
500.0000 mg | ORAL_TABLET | Freq: Four times a day (QID) | ORAL | Status: DC | PRN
  Administered 2019-07-24 (×4): 500 mg via ORAL
  Filled 2019-07-23 (×4): qty 1

## 2019-07-23 MED ORDER — ALBUTEROL SULFATE (2.5 MG/3ML) 0.083% IN NEBU
3.0000 mL | INHALATION_SOLUTION | RESPIRATORY_TRACT | Status: DC
  Administered 2019-07-23: 3 mL via RESPIRATORY_TRACT
  Filled 2019-07-23: qty 3

## 2019-07-23 MED ORDER — SODIUM CHLORIDE 0.9 % IV SOLN
INTRAVENOUS | Status: DC | PRN
  Administered 2019-07-23 – 2019-07-24 (×2): 250 mL via INTRAVENOUS

## 2019-07-23 MED ORDER — ALBUTEROL SULFATE (2.5 MG/3ML) 0.083% IN NEBU: 3.0000 mL | INHALATION_SOLUTION | RESPIRATORY_TRACT | Status: DC | PRN

## 2019-07-23 MED ORDER — SODIUM CHLORIDE 0.9 % IV BOLUS
1000.0000 mL | Freq: Once | INTRAVENOUS | Status: AC
  Administered 2019-07-23: 1000 mL via INTRAVENOUS

## 2019-07-23 MED ORDER — SODIUM CHLORIDE 0.9 % IV SOLN
100.0000 mg | Freq: Two times a day (BID) | INTRAVENOUS | Status: DC
  Administered 2019-07-23 – 2019-07-26 (×6): 100 mg via INTRAVENOUS
  Filled 2019-07-23 (×7): qty 100

## 2019-07-23 MED ORDER — SODIUM CHLORIDE 0.9 % IV SOLN
1.0000 g | INTRAVENOUS | Status: DC
  Administered 2019-07-23 – 2019-07-25 (×3): 1 g via INTRAVENOUS
  Filled 2019-07-23 (×2): qty 10
  Filled 2019-07-23 (×2): qty 1
  Filled 2019-07-23 (×2): qty 10

## 2019-07-23 MED ORDER — FLUTICASONE FUROATE-VILANTEROL 200-25 MCG/INH IN AEPB
1.0000 | INHALATION_SPRAY | Freq: Every day | RESPIRATORY_TRACT | Status: DC
  Administered 2019-07-24 – 2019-07-28 (×5): 1 via RESPIRATORY_TRACT
  Filled 2019-07-23: qty 28

## 2019-07-23 MED ORDER — INSULIN GLARGINE 100 UNIT/ML ~~LOC~~ SOLN
6.0000 [IU] | Freq: Every day | SUBCUTANEOUS | Status: DC
  Administered 2019-07-24 – 2019-07-28 (×5): 6 [IU] via SUBCUTANEOUS
  Filled 2019-07-23 (×5): qty 0.06

## 2019-07-23 MED ORDER — IPRATROPIUM-ALBUTEROL 0.5-2.5 (3) MG/3ML IN SOLN
3.0000 mL | Freq: Four times a day (QID) | RESPIRATORY_TRACT | Status: DC
  Administered 2019-07-23 – 2019-07-24 (×5): 3 mL via RESPIRATORY_TRACT
  Filled 2019-07-23 (×5): qty 3

## 2019-07-23 MED ORDER — ROSUVASTATIN CALCIUM 20 MG PO TABS
20.0000 mg | ORAL_TABLET | Freq: Every day | ORAL | Status: DC
  Administered 2019-07-23 – 2019-07-28 (×6): 20 mg via ORAL
  Filled 2019-07-23 (×6): qty 1

## 2019-07-23 MED ORDER — LEVOFLOXACIN IN D5W 500 MG/100ML IV SOLN: 500.0000 mg | INTRAVENOUS | Status: DC

## 2019-07-23 NOTE — H&P (Signed)
History and Physical    Katherine [name] IOX:[id number] DOB: 28-Jun-1956 DOA: 07/23/2019  PCP: [name], MD  Patient coming from: [facility] ED  I have personally briefly reviewed patient's old medical records in [place]  Chief Complaint: flu like illness  HPI: Katherine [name] is a 63 y.o. 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  The shortness of breath has been progressively worsening and with the weakness, she presented to the ED. She does feel a little orthopneic.  Her SOB is helped by her inhalers.  She denies any CP. She has a history of a chronic cough which has been undergoing work up in the outpatient setting.  She thinks that her cough is not necessarily more productive than before.  She has had subjective fever with temperatures as high as 99.2, chills without sweats. She denies exposure to COVID-19.  She states she felt this way once before when she was septic from a finger infection which she thinks is doing okay now.  05/2019 - just finished abx for a UTI the last several weeks which has been on doxy, then cipro and these symptoms have resolved.    She lives with a roommate.    Sees a pulmonologist for extrinsic asthma with seasonal allergies and endorses compliance with her inhalers and all of her medicines.     ED Course:  Vitals - RR max of 37 but averaging low 20's, 123/55, desatted to 87% while on room air then was satting okay on 1-2L  Recent Labs  Lab 07/23/19 0452 07/23/19 1830  NA 135 134*  K 4.1 4.1  CO2 24 23  BUN 10 8  CREATININE 0.96 0.95  MG  --  1.5*  WBC 10.4 11.2*  HGB 10.1* 9.7*  PLT 246 217    HS Troponins - 24, 28 Lactic acid - 1.2 Urine dipstick shows  negative for all components.  Micro exam: not done.  Influenza negative, COVID test is negative x2. EKG - chronic LBBB, QTc is 506. JTc ~356. CXR - IMPRESSION: Stable mild cardiomegaly. No active lung disease. Interventions -  fluids   Review of Systems: As per HPI otherwise 10 point review of systems negative.  Other pertinents as below:  General - very fatigued and weak HEENT -  Cardio - denies any CP Resp - feels sob, with wheezing GI - denies any n/v/d/GI pain GU - thinks that her previous UTI symptoms had resolved MSK - feels weak Skin - has chronic skin changes Neuro - denies any new numbness or weakness Psych - mood has been okay, denies any SI/HI   Past Medical History:  Diagnosis Date  . Anemia   . Anxiety   . Bilateral carotid artery stenosis    Carotid duplex 09/6832: 1-96% LICA, 22-29% RICA, >79% RECA, f/u 1 yr suggested  . CAD (coronary artery disease) of bypass graft 5/01; 3/'02, 8/'03, 10/'04; 1/15   PCI x 5 to SVG-D1   . CAD in native artery 07/1993    3 Vessel Disease (LAD-D1 & RCA) -- CABG  . CAD S/P percutaneous coronary angioplasty    PCI to SVG-D1 insertion/native D1 x 4 = '01 -(S660 BMS 2.5 x 9 - insertion into D1; '02 -  distal overlap ACS Pixel 2.5 x 8  BMS; '03 distal/native ISR/Thrombosis - Pixel 2.5 x 13; '04 - ISR-  Taxus 2.5 x 20 (covered all);; 1/15 - mid SVG-D1 (50% distal ISR) - Promus P 2.75 x 20 -- 2.8 mm  . COPD mixed type ([place])    Followed by Dr. Lamonte Sakai "pulmonologist said no COPD"  . Depression   . Depression with anxiety   . Diabetes mellitus type 2 in obese ([place])   . Diarrhea    started after cholecystectomy and mass removed from intestine  . Dyslipidemia, goal LDL below 70    08/2012: TC 137, TG 200, HDL 32!, LDL 45; on statin (followed by Dr.Deterding)  . ESRD (end stage renal disease) ([place]) 1991   s/p Cadaveric Renal Transplant [facility] - Dr. Jimmy Footman)   . Family history of adverse reaction to anesthesia    mom's bp dropped during/after  anesthesia  . Fibromyalgia   . GERD (gastroesophageal reflux disease)   . Glomerulonephritis, chronic, rapidly progressive 79  . 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 07/1993   Rescue PTCA of RCA -- referred for CABG.  . H/O: GI bleed   . Headache    migraines in the past  . History of kidney stones   . Hypertension associated with diabetes ([place])   . Morbid obesity ([place])   . MRSA (methicillin resistant staph aureus) culture positive   . OSA (obstructive sleep apnea)    no longer on CPAP or home O2, states she doesn't need now after lap band  . PAD (peripheral artery disease) ([place]) 08/2013   LEA Dopplers to be read by Dr. Fletcher Anon  . PAF (paroxysmal atrial fibrillation) ([place]) 06/2014   Noted on CardioNet Monitor  - rate ~112; no recurrent symptoms. Nonionic regulation because of frequent GI procedures. Patient prefers Plavix  . Pneumonia   . Recurrent boils    Bilateral Groin  . Rheumatoid arthritis ([place])    Per Patient Report; associated with OA  . 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 Servando Snare: LIMA-LAD, SVG-bifurcatingD1, SVG-rPDA  . S/p cadaver renal transplant [place]  . Stroke [facility]) 2012   "right eye stroke- half blind now"  . Unstable angina ([place]) 5/01; 3/'02, 8/'03, 10/'04; 1/15   x 5 occurences since Inf-Post STEMI in 1994    Past Surgical History:  Procedure Laterality Date  . ABDOMINAL AORTOGRAM N/A 04/21/2018   Procedure: ABDOMINAL AORTOGRAM;  Surgeon: [name], MD;  Location: [place] CV LAB;  Service: Cardiovascular;  Laterality: N/A;  . CARDIAC CATHETERIZATION  5/'01, 3/'02, 8/'03, 10/'04; 1/'15   08/22/2013: LAD & RCA 100%; LIMA-LAD & SVG-rPDA patent; Cx-- OM1 60%, OM2 ostial ~50%; SVG-D1 - 80% mid, 50% distal ISR --PCI  . CATHETER REMOVAL    . CHOLECYSTECTOMY N/A 10/29/2014   Procedure: LAPAROSCOPIC CHOLECYSTECTOMY WITH INTRAOPERATIVE CHOLANGIOGRAM;  Surgeon: Excell Seltzer, MD;  Location: [facility];  Service: General;   Laterality: N/A;  . CORONARY ANGIOPLASTY  1994   x5  . CORONARY ARTERY BYPASS GRAFT  1995   LIMA-LAD, SVG-RPDA, SVG-D1  . ESOPHAGOGASTRODUODENOSCOPY N/A 10/15/2016   Procedure: ESOPHAGOGASTRODUODENOSCOPY (EGD);  Surgeon: Wilford Corner, MD;  Location: [facility] ENDOSCOPY;  Service: Endoscopy;  Laterality: N/A;  . I&D EXTREMITY Right 01/29/2018   Procedure: IRRIGATION AND DEBRIDEMENT THUMB;  Surgeon: Dayna Barker, MD;  Location: [place];  Service: Plastics;  Laterality: Right;  . INCISE AND DRAIN ABCESS    . KIDNEY TRANSPLANT  1991  .  KNEE ARTHROSCOPY WITH LATERAL MENISECTOMY Left 12/03/2017   Procedure: LEFT KNEE ARTHROSCOPY WITH LATERAL MENISECTOMY;  Surgeon: Earlie Server, MD;  Location: [place];  Service: Orthopedics;  Laterality: Left;  . LAPAROSCOPIC GASTRIC BANDING  04/2004; 10/'09, 2/'10   Port Replacement x 2  . LEFT HEART CATH AND CORS/GRAFTS ANGIOGRAPHY N/A 04/21/2018   Procedure: LEFT HEART CATH AND CORS/GRAFTS ANGIOGRAPHY;  Surgeon: [name], MD;  Location: [place] CV LAB;  Service: Cardiovascular;  Laterality: N/A;  . LEFT HEART CATHETERIZATION WITH CORONARY/GRAFT ANGIOGRAM N/A 08/23/2013   Procedure: LEFT HEART CATHETERIZATION WITH Beatrix Fetters;  Surgeon: Wellington Hampshire, MD;  Location: [place] CATH LAB;  Service: Cardiovascular;  Laterality: N/A;  . Lower Extremity Arterial Dopplers  08/2013   ABI: R 0.96, L 1.04  . MULTIPLE TOOTH EXTRACTIONS  age 58  . NM MYOVIEW LTD  03/2016   EF 62%. LOW RISK. C/W prior MI - no Ischemia. Apical hypokinesis.  [name] Kitchen PERCUTANEOUS CORONARY STENT INTERVENTION (PCI-S)  5/'01, 3/'02, 8/'03, 10/'04;   '01 - S660 BMS 2.5 x 9 - dSVG-D1 into D1; '02- post-stent stenosis - 2.5 x 8 Pixel BMS; '8\03: ISR/Thrombosis into native D1 - AngioJet, 2.5 x 13 Pixel; '04 - ISR 95% - covered stented area with Taxus DES 2.5 mm x 20 (2.88)  . PERCUTANEOUS CORONARY STENT INTERVENTION (PCI-S)  08/23/2013   Procedure: PERCUTANEOUS CORONARY STENT INTERVENTION (PCI-S);   Surgeon: Wellington Hampshire, MD;  Location: [facility] CATH LAB;  Service: Cardiovascular;;mid SVG-D1 80%; distal stent ~50% ISR; Promus Prermier DES 2.75 mm xc 20 mm (2.8 mm)  . PORT-A-CATH REMOVAL     kidney  . TRANSTHORACIC ECHOCARDIOGRAM  02/2016   EF 55-60%. Septal dyssynergy from CABG GR 2 DD. Aortic valve is trileaflet the functional bicuspid with sclerosis but not yet notable for stenosis stenosis.; no Significant change  . TUBAL LIGATION    . wrist fistula repair Left    dialysis for one year     reports that she quit smoking about 16 years ago. Her smoking use included cigarettes. She has a 30.00 pack-year smoking history. She has never used smokeless tobacco. She reports that she does not drink alcohol or use drugs.  Allergies  Allergen Reactions  . Tetracycline Hives  . Hydromorphone Hcl Nausea And Vomiting  . Niacin Other (See Comments)    Mouth blisters  . Niaspan [Niacin Er] Other (See Comments)    Mouth blisters  . Sulfa Antibiotics Nausea Only and Other (See Comments)    "Tears up stomach"  . Sulfonamide Derivatives Other (See Comments)    Reaction: per patient "tears her stomach up"  . Codeine Nausea And Vomiting  . Erythromycin Nausea And Vomiting  . Hydromorphone Hcl Nausea And Vomiting  . Morphine And Related Nausea And Vomiting  . Nalbuphine Nausea And Vomiting  . Sulfasalazine Nausea Only and Other (See Comments)    per patient "tears her stomach up", "Tears up stomach"  . Tape Rash and Other (See Comments)    No "plastic" tape," please    Family History  Problem Relation Age of Onset  . Cancer Mother        liver  . Heart disease Father   . Cancer Father        colon  . Arrhythmia Brother        Atrial Fibrillation  . Arrhythmia Paternal Aunt        Atrial Fibrillation    Prior to Admission medications   Medication Sig Start  Date End Date Taking? Authorizing Provider  acetaminophen (TYLENOL) 500 MG tablet Take 1 tablet (500 mg total) by mouth every 6  (six) hours as needed for moderate pain or headache. Patient taking differently: Take 1,000 mg by mouth every 6 (six) hours as needed for moderate pain or headache.  10/17/16  Yes Arrien, Jimmy Picket, MD  albuterol (PROVENTIL) (2.5 MG/3ML) 0.083% nebulizer solution Take 3 mLs (2.5 mg total) by nebulization every 6 (six) hours as needed for wheezing or shortness of breath (dx: J45.20). 03/15/19  Yes Parrett, Tammy S, NP  azaTHIOprine (IMURAN) 50 MG tablet Take 125 mg by mouth every evening.    Yes [provider]  calcitRIOL (ROCALTROL) 0.25 MCG capsule Take 0.25 mcg by mouth every 3 (three) days.    Yes [provider]  carbamazepine (TEGRETOL) 200 MG tablet Take 1 tablet (200 mg total) by mouth 2 (two) times daily. 07/12/19  Yes Bayard Hugger, NP  clotrimazole (MYCELEX) 10 MG troche Take 1 tablet (10 mg total) by mouth 5 (five) times daily. 11/28/18  Yes Parrett, Tammy S, NP  cyclobenzaprine (FLEXERIL) 10 MG tablet TAKE 1 TABLET BY MOUTH 3 TIMES DAILY AS NEEDED FOR MUSCLE SPASMS Patient taking differently: Take 10 mg by mouth 2 (two) times daily.  10/19/17  Yes Meredith Staggers, MD  diltiazem (CARDIZEM CD) 120 MG 24 hr capsule TAKE 1 CAPSULE BY MOUTH EVERY DAY 06/22/19  Yes [name], MD  dofetilide (TIKOSYN) 250 MCG capsule TAKE 1 CAPSULE BY MOUTH 2 (TWO) TIMES DAILY. 04/18/19  Yes Sherran Needs, NP  DULoxetine (CYMBALTA) 30 MG capsule TAKE 2 CAPSULES (60 MG TOTAL) BY MOUTH EVERY MORNING. 05/08/19  Yes Bayard Hugger, NP  famotidine (PEPCID) 20 MG tablet Take 20 mg by mouth daily. 05/19/17  Yes [provider]  fluticasone (FLONASE) 50 MCG/ACT nasal spray PLACE 2 SPRAYS INTO BOTH NOSTRILS 2 TIMES DAILY 03/22/19  Yes Byrum, Rose Fillers, MD  glimepiride (AMARYL) 4 MG tablet Take 4 mg by mouth every morning. 04/20/16  Yes [provider]  Insulin Glargine (BASAGLAR KWIKPEN) 100 UNIT/ML SOPN Inject 12 Units into the skin daily.   Yes [provider]   metFORMIN (GLUCOPHAGE) 500 MG tablet Take 500 mg by mouth 2 (two) times daily. 10/03/16  Yes [provider]  metoprolol tartrate (LOPRESSOR) 25 MG tablet TAKE 1 TABLET BY MOUTH TWICE A DAY 01/10/19  Yes [name], MD  montelukast (SINGULAIR) 10 MG tablet TAKE 1 TABLET BY MOUTH EVERY DAY 05/30/19  Yes Byrum, Rose Fillers, MD  NOVOLOG FLEXPEN 100 UNIT/ML FlexPen Inject 4 Units into the skin 2 (two) times daily as needed for high blood sugar.  12/13/17  Yes [provider]  Oxycodone HCl 10 MG TABS Take 1 tablet (10 mg total) by mouth every 8 (eight) hours as needed. 06/08/19  Yes Meredith Staggers, MD  rosuvastatin (CRESTOR) 20 MG tablet TAKE 1 TABLET BY MOUTH EVERY DAY 10/21/18  Yes [name], MD  SYMBICORT 160-4.5 MCG/ACT inhaler TAKE 2 PUFFS BY MOUTH TWICE A DAY 04/10/19  Yes Collene Gobble, MD  VENTOLIN HFA 108 (90 Base) MCG/ACT inhaler TAKE 2 PUFFS BY MOUTH EVERY 6 HOURS AS NEEDED FOR WHEEZE OR SHORTNESS OF BREATH 03/15/19  Yes Collene Gobble, MD  warfarin (COUMADIN) 10 MG tablet TAKE 1 TABLET DAILY OR AS DIRECTED BY COUMADIN CLINIC 12/14/18  Yes [name], MD  guaiFENesin (MUCINEX) 600 MG 12 hr tablet Take 1  tablet (600 mg total) by mouth 2 (two) times daily. Patient not taking: Reported on 05/29/2019 02/02/18   Bonnielee Haff, MD  nitroGLYCERIN (NITROSTAT) 0.4 MG SL tablet Place 1 tablet (0.4 mg total) under the tongue every 5 (five) minutes as needed for chest pain. 09/11/13   [name], MD    Physical Exam: Vitals:   07/23/19 1800 07/23/19 1820 07/23/19 2021 07/23/19 2125  BP: (!) 148/74 (!) 154/74 132/65   Pulse: (!) 106 (!) 108 (!) 109   Resp:  18 18   Temp:  99.6 F (37.6 C) 99.6 F (37.6 C)   TempSrc:  Oral Oral   SpO2: 96% 95% 97% 94%  Weight:  108.8 kg    Height:  5\' 1"  (1.549 m) 5\' 1"  (1.549 m)     Constitutional: mild distress, comfortable, obese, appears weak Eyes: pupils equal and reactive to light, anicteric, without injection  ENMT: wearing a mask, no noted rhinorrhea Neck: normal, supple, no masses, no thyromegaly noted Respiratory: increased work of breathing, nwob, wheezing heard diffusely in the upper airways,  Cardiovascular: rrr w/o mrg, warm extremities Abdomen: NBS, NT,  GU: Denies suprapubic tenderness Musculoskeletal: moving all 4 extremities, strength is intact but patient appears weak and is not able to push herself up in the bed.  No unilateral assymetric swelling. Skin: chronic skin lesions, appear to be warts. Neurologic: CN 2-12 grossly intact. Sensation intact Psychiatric: AO appearing, mentating appropriate  Labs on Admission: I have personally reviewed following labs and imaging studies  CBC: Recent Labs  Lab 07/23/19 0452 07/23/19 1830  WBC 10.4 11.2*  NEUTROABS 8.2* 8.9*  HGB 10.1* 9.7*  HCT 31.9* 31.1*  MCV 99.7 99.7  PLT 246 588   Basic Metabolic Panel: Recent Labs  Lab 07/23/19 0452 07/23/19 1830  NA 135 134*  K 4.1 4.1  CL 102 101  CO2 24 23  GLUCOSE 159* 114*  BUN 10 8  CREATININE 0.96 0.95  CALCIUM 8.9 8.7*  MG  --  1.5*  PHOS  --  3.9   GFR: Estimated Creatinine Clearance: 70 mL/min (by C-G formula based on SCr of 0.95 mg/dL). Liver Function Tests: Recent Labs  Lab 07/23/19 0452  AST 17  ALT 10  ALKPHOS 112  BILITOT 0.5  PROT 6.6  ALBUMIN 2.8*   No results for input(s): LIPASE, AMYLASE in the last 168 hours. No results for input(s): AMMONIA in the last 168 hours. Coagulation Profile: Recent Labs  Lab 07/23/19 1830  INR 3.6*   Cardiac Enzymes: No results for input(s): CKTOTAL, CKMB, CKMBINDEX, TROPONINI in the last 168 hours. BNP (last 3 results) No results for input(s): PROBNP in the last 8760 hours. HbA1C: Recent Labs    07/23/19 1830  HGBA1C 6.6*   CBG: Recent Labs  Lab 07/23/19 1822 07/23/19 2020  GLUCAP 109* 104*   Lipid Profile: No results for input(s): CHOL, HDL, LDLCALC, TRIG, CHOLHDL, LDLDIRECT in the last 72 hours.  Thyroid Function Tests: No results for input(s): TSH, T4TOTAL, FREET4, T3FREE, THYROIDAB in the last 72 hours. Anemia Panel: No results for input(s): VITAMINB12, FOLATE, FERRITIN, TIBC, IRON, RETICCTPCT in the last 72 hours. Urine analysis:    Component Value Date/Time   COLORURINE YELLOW 07/23/2019 1231   APPEARANCEUR CLEAR 07/23/2019 1231   LABSPEC 1.019 07/23/2019 1231   PHURINE 6.0 07/23/2019 1231   GLUCOSEU NEGATIVE 07/23/2019 1231   HGBUR NEGATIVE 07/23/2019 1231   BILIRUBINUR NEGATIVE 07/23/2019 1231   KETONESUR NEGATIVE 07/23/2019 1231   PROTEINUR  NEGATIVE 07/23/2019 1231   UROBILINOGEN 0.2 10/16/2013 0110   NITRITE NEGATIVE 07/23/2019 1231   LEUKOCYTESUR NEGATIVE 07/23/2019 1231    Radiological Exams on Admission: Dg Chest Port 1 View  Result Date: 07/23/2019 CLINICAL DATA:  Shortness of breath and generalized weakness for 2 weeks. Chest pain. Nausea and vomiting. EXAM: PORTABLE CHEST 1 VIEW COMPARISON:  12/19/2018 FINDINGS: Stable mild cardiomegaly and prior CABG. Mild scarring seen in the left midlung. Chronic pulmonary interstitial prominence noted. No evidence of acute infiltrate or edema. No evidence of pleural effusion. IMPRESSION: Stable mild cardiomegaly. No active lung disease. Electronically Signed   By: Marlaine Hind M.D.   On: 07/23/2019 04:34    EKG: Independently reviewed. Chronic LBBB  Assessment/Plan Active Problems:   Extrinsic asthma   S/P kidney transplant   Asthma exacerbation   Morbid obesity - s/p Lap Band 9/'05   ESRD (end stage renal disease) ([place])   Hypertension associated with diabetes ([place])   Long term (current) use of anticoagulants   OSA (obstructive sleep apnea)   Current chronic use of systemic steroids  #Acute Hypoxic Respiratory Failure, POA, think probably due to an Asthma exacerbation, complicated by restrictive lung disease from patient's obesity... Possible component of HF with BNP 585.6 difficult to assess JVD since patient notes  some orthopnea. Doubt PCP since only on 5mg  prednisone --Steroid compromise at 30mg  for 5 days, hesitate to go to 40 with immunosuppression. --PPI --Cont' home singulair, symbicort, scheduled q6 duonebs for now -Incentive spirometry -Cont' oxygen, wean as able -Cough suppressant p.r.n. --BNP, could try lasix in the am  #Possible Acute Community acquired pneumonia, patient mentions being sick 1.5 weeks ago and has had acute worsening so will treat with abx.  suspect bacterial, POA No eosinophilia EKG showed chronic LBBB, Qtc is 506 more because of QRS prolongation --f/u lactic acids. procalcitonin  --sputum culture and blood cultures --COVID testing negative x2 --Since recently on abx was initially going to do Levaquin but limited by QTc so did ceftriaxone with doxycycline since patient had prseumed superworsening of an infection from last week...  Can deescalate based on procalcitonin perhaps and/or sputum culture. --Fluids --f/u HIV, mg, phos --patient states she has a history of OSA, but lost a lot of weight and then regained it and needs another sleep study.  Consider nightly CPAP  #pAtrial fibrillation, without RVR #Denies a history of DVT -continue Tikosyn, QTC with a left bundle branch block is 500, metoprolol, diltiazem --Maintain electrolytes  #Chronic Pain - Continue Tylenol, hold oxycodone while she is somnolent  #Type 2 diabetes mellitus-glargine decreased to 6 units, titrate, hold orals #GERD-Switched to PPI with increase of steroids from home H2 blocker, return to this at discharge ------#Clarify why on Tegretol? Being off of it can affect warfarin  #weakness - patient feels weak, will order PT, possibly could go to a SNF since she has been weak for a week, lives with her Friend who is a girl.  #ESRD s/p kidney transplant.  Denies any kidney pain.  No thoughts of rejection.  Pt states she is adherent to her immunosuppression Prednisone as above Cont' imuran.   #Chronic Hypoalbuminemia - stable  #T2DM - hold orals, SSI... expect to increase long acting with patient on steroids.  Patient and/or Family completely agreed with the plan, expressed understanding and I answered all questions.  DVT prophylaxis: IR:[id number] Code Status: Full code Family Communication: Niece is the go-to person, limited family otherwise Disposition Plan: Appears fairly weak and might benefit from a  SNF   Consults called: n/a Admission status: Inpatient   A total of 90 minutes utilized during this admission.  [facility]   If 7PM-7AM, please contact night-coverage [email or web address] Password [email or web address]  07/23/2019, 9:52 PM

## 2019-07-23 NOTE — ED Notes (Signed)
Attempted to call nursing report.  

## 2019-07-23 NOTE — Progress Notes (Signed)
NEW ADMISSION NOTE New Admission Note:   Arrival Method: stretcher from ED Mental Orientation: axox4 Telemetry:34m11 Assessment: Completed Skin:see assessment  IV: LFA Pain: denies Tubes: none  Safety Measures: Safety Fall Prevention Plan has been discussed  Admission: To be Completed [facility] Orientation: Patient has been orientated to the room, unit and staff.  Family: niece at the bedside.   Orders have been reviewed and implemented. Will continue to monitor the patient. Call light has been placed within reach and bed alarm has been activated.   Paulla Fore, RN

## 2019-07-23 NOTE — ED Notes (Signed)
Pt states she is unable to urinate at this time, IVF started

## 2019-07-23 NOTE — ED Notes (Signed)
Urine culture attached to urine sample 

## 2019-07-23 NOTE — ED Notes (Signed)
Respiratory at bedside.

## 2019-07-23 NOTE — Progress Notes (Signed)
Pt asking for pain she takes at home, oxycodone. Notified MD Francesco Sor that pt refused Tylenol PO and was asking if she could have oxycodone. Pt was also dry heaving. No PRN for nausea as pt has prolonged QT per MD Francesco Sor.   Paulla Fore, RN, BSN

## 2019-07-23 NOTE — ED Triage Notes (Signed)
Pt transported from home by [facility] with c/o generalized weakness x 2 weeks, pt states she on antbx but feels worse now that antbx have completed. Pt c/o pain "all over". A & O. Pt requesting oxygen on arrival, pt is not on 02 at home, pt states it makes her feel better. +nausea, chronic diarrhea. Emesis yesterday.

## 2019-07-23 NOTE — ED Provider Notes (Signed)
[facility]ERGENCY DEPARTMENT Provider Note   CSN: [id number] Arrival date & time: 07/23/19  0346    History   Chief Complaint Chief Complaint  Patient presents with  . Weakness    HPI Katherine [name] is a 63 y.o. female.   The history is provided by the patient.  Weakness 's he has history of diabetes, hypertension, hyperlipidemia, renal transplant and comes in with flulike illness for the last 3 days.  She has had subjective fever with temperatures as high as 99.2, chills without sweats.  She has had cough productive of brown sputum and has noted dyspnea.  She feels generally weak.  She is complaining of generalized body aches.  There has been some nasal stuffiness but no sore throat.  There has been no change in sense of smell or taste.  There has been no nausea, vomiting, diarrhea.  She denies any urinary symptoms.  She denies exposure to COVID-19.  Past Medical History:  Diagnosis Date  . Anemia   . Anxiety   . Bilateral carotid artery stenosis    Carotid duplex 02/487: 8-91% LICA, 69-45% RICA, >03% RECA, f/u 1 yr suggested  . CAD (coronary artery disease) of bypass graft 5/01; 3/'02, 8/'03, 10/'04; 1/15   PCI x 5 to SVG-D1   . CAD in native artery 07/1993    3 Vessel Disease (LAD-D1 & RCA) -- CABG  . CAD S/P percutaneous coronary angioplasty    PCI to SVG-D1 insertion/native D1 x 4 = '01 -(S660 BMS 2.5 x 9 - insertion into D1; '02 - distal overlap ACS Pixel 2.5 x 8  BMS; '03 distal/native ISR/Thrombosis - Pixel 2.5 x 13; '04 - ISR-  Taxus 2.5 x 20 (covered all);; 1/15 - mid SVG-D1 (50% distal ISR) - Promus P 2.75 x 20 -- 2.8 mm  . COPD mixed type ([place])    Followed by Dr. Lamonte Sakai "pulmonologist said no COPD"  . Depression   . Depression with anxiety   . Diabetes mellitus type 2 in obese ([place])   . Diarrhea    started after cholecystectomy and mass removed from intestine  . Dyslipidemia, goal LDL below 70    08/2012: TC 137, TG 200, HDL 32!, LDL 45;  on statin (followed by Dr.Deterding)  . ESRD (end stage renal disease) ([place]) 1991   s/p Cadaveric Renal Transplant [facility] - Dr. Jimmy Footman)   . Family history of adverse reaction to anesthesia    mom's bp dropped during/after anesthesia  . Fibromyalgia   . GERD (gastroesophageal reflux disease)   . Glomerulonephritis, chronic, rapidly progressive 70  . 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 07/1993   Rescue PTCA of RCA -- referred for CABG.  . H/O: GI bleed   . Headache    migraines in the past  . History of kidney stones   . Hypertension associated with diabetes ([place])   . MRSA (methicillin resistant staph aureus) culture positive   . Obesity    s/p Lap Band 04/2004- Port Replacement 10/09 & 2/10 for infection; Dr. Excell Seltzer.  . OSA (obstructive sleep apnea)    no longer on CPAP or home O2, states she doesn't need now after lap band  . PAD (peripheral artery disease) ([place]) 08/2013   LEA Dopplers to be read by Dr. Fletcher Anon  . PAF (paroxysmal atrial fibrillation) ([place]) 06/2014   Noted on CardioNet Monitor  - rate ~112; no recurrent symptoms. Nonionic regulation because of frequent GI procedures. Patient prefers Plavix  .  Pneumonia   . Recurrent boils    Bilateral Groin  . Rheumatoid arthritis ([place])    Per Patient Report; associated with OA  . 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 Servando Snare: LIMA-LAD, SVG-bifurcatingD1, SVG-rPDA  . S/p cadaver renal transplant [place]  . Stroke [facility]) 2012   "right eye stroke- half blind now"  . Unstable angina ([place]) 5/01; 3/'02, 8/'03, 10/'04; 1/15   x 5 occurences since Inf-Post STEMI in 1994    Patient Active Problem List   Diagnosis Date Noted  . Severe episode of recurrent major depressive disorder, with psychotic features ([place]) 02/16/2019  . Asthma with bronchitis 12/16/2018  . Obstructive chronic bronchitis without exacerbation ([place]) 11/03/2018  . Long term (current) use of anticoagulants 04/28/2018  . S/P kidney  transplant 01/28/2018  . Primary osteoarthritis of right knee 08/18/2017  . Aortic valve sclerosis 01/28/2017  . 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 Fibromyalgia 03/30/2016  . Other spondylosis with radiculopathy, lumbar region 03/30/2016  . Type 2 diabetes mellitus with peripheral neuropathy ([place]) 03/30/2016  . Paroxysmal atrial fibrillation ([place]) 06/28/2014  . Hypertension associated with diabetes ([place])   . PAD (peripheral artery disease) ([place]) 08/17/2013  . Stenosis of right carotid artery without infarction 10/08/2012  . Dyslipidemia, goal LDL below 70 10/08/2012    Class: Diagnosis of  . Mitral annular calcification 10/08/2012  . Renal transplant disorder 06/12/2012  . Chronic allergic rhinitis 04/29/2011  . Sleep apnea 09/09/2007  . Extrinsic asthma 09/09/2007  . GERD 09/09/2007  . COUGH, CHRONIC 09/09/2007  . Morbid obesity - s/p Lap Band 9/'05 05/07/2004    Class: Diagnosis of  . S/P CABG (coronary artery bypass graft) x 3 09/07/1993    Class: History of    Past Surgical History:  Procedure Laterality Date  . ABDOMINAL AORTOGRAM N/A 04/21/2018   Procedure: ABDOMINAL AORTOGRAM;  Surgeon: [name], MD;  Location: [facility] CV LAB;  Service: Cardiovascular;  Laterality: N/A;  . CARDIAC CATHETERIZATION  5/'01, 3/'02, 8/'03, 10/'04; 1/'15   08/22/2013: LAD & RCA 100%; LIMA-LAD & SVG-rPDA patent; Cx-- OM1 60%, OM2 ostial ~50%; SVG-D1 - 80% mid, 50% distal ISR --PCI  . CATHETER REMOVAL    . CHOLECYSTECTOMY N/A 10/29/2014   Procedure: LAPAROSCOPIC CHOLECYSTECTOMY WITH INTRAOPERATIVE CHOLANGIOGRAM;  Surgeon: Excell Seltzer, MD;  Location: [facility];  Service: General;  Laterality: N/A;  . CORONARY ANGIOPLASTY  1994   x5  . CORONARY ARTERY BYPASS GRAFT  1995   LIMA-LAD, SVG-RPDA, SVG-D1  . ESOPHAGOGASTRODUODENOSCOPY N/A 10/15/2016   Procedure: ESOPHAGOGASTRODUODENOSCOPY (EGD);  Surgeon: Wilford Corner, MD;  Location: [facility] ENDOSCOPY;  Service: Endoscopy;  Laterality: N/A;  . I&D  EXTREMITY Right 01/29/2018   Procedure: IRRIGATION AND DEBRIDEMENT THUMB;  Surgeon: Dayna Barker, MD;  Location: [facility];  Service: Plastics;  Laterality: Right;  . INCISE AND DRAIN ABCESS    . KIDNEY TRANSPLANT  1991  . KNEE ARTHROSCOPY WITH LATERAL MENISECTOMY Left 12/03/2017   Procedure: LEFT KNEE ARTHROSCOPY WITH LATERAL MENISECTOMY;  Surgeon: Earlie Server, MD;  Location: [facility];  Service: Orthopedics;  Laterality: Left;  . LAPAROSCOPIC GASTRIC BANDING  04/2004; 10/'09, 2/'10   Port Replacement x 2  . LEFT HEART CATH AND CORS/GRAFTS ANGIOGRAPHY N/A 04/21/2018   Procedure: LEFT HEART CATH AND CORS/GRAFTS ANGIOGRAPHY;  Surgeon: [name], MD;  Location: [facility];  Service: Cardiovascular;  Laterality: N/A;  . LEFT HEART CATHETERIZATION WITH CORONARY/GRAFT ANGIOGRAM N/A 08/23/2013   Procedure: LEFT HEART CATHETERIZATION WITH CORONARY/GRAFT  ANGIOGRAM;  Surgeon: Wellington Hampshire, MD;  Location: [facility] CATH LAB;  Service: Cardiovascular;  Laterality: N/A;  . Lower Extremity Arterial Dopplers  08/2013   ABI: R 0.96, L 1.04  . MULTIPLE TOOTH EXTRACTIONS  age 33  . NM MYOVIEW LTD  03/2016   EF 62%. LOW RISK. C/W prior MI - no Ischemia. Apical hypokinesis.  [name] Kitchen PERCUTANEOUS CORONARY STENT INTERVENTION (PCI-S)  5/'01, 3/'02, 8/'03, 10/'04;   '01 - S660 BMS 2.5 x 9 - dSVG-D1 into D1; '02- post-stent stenosis - 2.5 x 8 Pixel BMS; '8\03: ISR/Thrombosis into native D1 - AngioJet, 2.5 x 13 Pixel; '04 - ISR 95% - covered stented area with Taxus DES 2.5 mm x 20 (2.88)  . PERCUTANEOUS CORONARY STENT INTERVENTION (PCI-S)  08/23/2013   Procedure: PERCUTANEOUS CORONARY STENT INTERVENTION (PCI-S);  Surgeon: Wellington Hampshire, MD;  Location: [facility] CATH LAB;  Service: Cardiovascular;;mid SVG-D1 80%; distal stent ~50% ISR; Promus Prermier DES 2.75 mm xc 20 mm (2.8 mm)  . PORT-A-CATH REMOVAL     kidney  . TRANSTHORACIC ECHOCARDIOGRAM  02/2016   EF 55-60%. Septal dyssynergy from CABG GR 2 DD. Aortic valve is  trileaflet the functional bicuspid with sclerosis but not yet notable for stenosis stenosis.; no Significant change  . TUBAL LIGATION    . wrist fistula repair Left    dialysis for one year     OB History   No obstetric history on file.      Home Medications    Prior to Admission medications   Medication Sig Start Date End Date Taking? Authorizing Provider  acetaminophen (TYLENOL) 500 MG tablet Take 1 tablet (500 mg total) by mouth every 6 (six) hours as needed for moderate pain or headache. Patient taking differently: Take 1,000 mg by mouth every 6 (six) hours as needed for moderate pain or headache.  10/17/16   Arrien, Jimmy Picket, MD  albuterol (PROVENTIL) (2.5 MG/3ML) 0.083% nebulizer solution Take 3 mLs (2.5 mg total) by nebulization every 6 (six) hours as needed for wheezing or shortness of breath (dx: J45.20). 03/15/19   Parrett, Fonnie Mu, NP  azaTHIOprine (IMURAN) 50 MG tablet Take 125 mg by mouth every evening.     [provider]  calcitRIOL (ROCALTROL) 0.25 MCG capsule Take 0.25 mcg by mouth every 3 (three) days.     [provider]  carbamazepine (TEGRETOL) 200 MG tablet Take 1 tablet (200 mg total) by mouth 2 (two) times daily. 07/12/19   Bayard Hugger, NP  clotrimazole (MYCELEX) 10 MG troche Take 1 tablet (10 mg total) by mouth 5 (five) times daily. 11/28/18   Parrett, Fonnie Mu, NP  cyclobenzaprine (FLEXERIL) 10 MG tablet TAKE 1 TABLET BY MOUTH 3 TIMES DAILY AS NEEDED FOR MUSCLE SPASMS Patient taking differently: Take 10 mg by mouth 2 (two) times daily.  10/19/17   Meredith Staggers, MD  diltiazem (CARDIZEM CD) 120 MG 24 hr capsule TAKE 1 CAPSULE BY MOUTH EVERY DAY 06/22/19   [name], MD  dofetilide (TIKOSYN) 250 MCG capsule TAKE 1 CAPSULE BY MOUTH 2 (TWO) TIMES DAILY. 04/18/19   Sherran Needs, NP  DULoxetine (CYMBALTA) 30 MG capsule TAKE 2 CAPSULES (60 MG TOTAL) BY MOUTH EVERY MORNING. 05/08/19   Bayard Hugger, NP  famotidine (PEPCID) 20 MG  tablet Take 20 mg by mouth daily. 05/19/17   [provider]  fluticasone (FLONASE) 50 MCG/ACT nasal spray PLACE 2 SPRAYS INTO BOTH NOSTRILS 2 TIMES DAILY 03/22/19   Byrum,  Rose Fillers, MD  glimepiride (AMARYL) 4 MG tablet Take 4 mg by mouth every morning. 04/20/16   [provider]  guaiFENesin (MUCINEX) 600 MG 12 hr tablet Take 1 tablet (600 mg total) by mouth 2 (two) times daily. Patient not taking: Reported on 05/29/2019 02/02/18   Bonnielee Haff, MD  Insulin Glargine [facility]) 100 UNIT/ML SOPN Inject 12 Units into the skin daily.    [provider]  metFORMIN (GLUCOPHAGE) 500 MG tablet Take 500 mg by mouth 2 (two) times daily. 10/03/16   [provider]  metoprolol tartrate (LOPRESSOR) 25 MG tablet TAKE 1 TABLET BY MOUTH TWICE A DAY 01/10/19   [name], MD  montelukast (SINGULAIR) 10 MG tablet TAKE 1 TABLET BY MOUTH EVERY DAY 05/30/19   Byrum, Rose Fillers, MD  nitroGLYCERIN (NITROSTAT) 0.4 MG SL tablet Place 1 tablet (0.4 mg total) under the tongue every 5 (five) minutes as needed for chest pain. 09/11/13   [name], MD  NOVOLOG FLEXPEN 100 UNIT/ML FlexPen Inject 4 Units into the skin 2 (two) times daily as needed for high blood sugar.  12/13/17   [provider]  Oxycodone HCl 10 MG TABS Take 1 tablet (10 mg total) by mouth every 8 (eight) hours as needed. 06/08/19   Meredith Staggers, MD  rosuvastatin (CRESTOR) 20 MG tablet TAKE 1 TABLET BY MOUTH EVERY DAY 10/21/18   [name], MD  SYMBICORT 160-4.5 MCG/ACT inhaler TAKE 2 PUFFS BY MOUTH TWICE A DAY 04/10/19   Collene Gobble, MD  VENTOLIN HFA 108 (90 Base) MCG/ACT inhaler TAKE 2 PUFFS BY MOUTH EVERY 6 HOURS AS NEEDED FOR WHEEZE OR SHORTNESS OF BREATH 03/15/19   Collene Gobble, MD  warfarin (COUMADIN) 10 MG tablet TAKE 1 TABLET DAILY OR AS DIRECTED BY COUMADIN CLINIC 12/14/18   [name], MD    Family History Family History  Problem Relation Age of Onset  . Cancer Mother         liver  . Heart disease Father   . Cancer Father        colon  . Arrhythmia Brother        Atrial Fibrillation  . Arrhythmia Paternal Aunt        Atrial Fibrillation    Social History Social History   Tobacco Use  . Smoking status: Former Smoker    Packs/day: 1.00    Years: 30.00    Pack years: 30.00    Types: Cigarettes    Quit date: 08/17/2002    Years since quitting: 16.9  . Smokeless tobacco: Never Used  Substance Use Topics  . Alcohol use: No  . Drug use: No     Allergies   Tetracycline, Hydromorphone hcl, Niacin, Niaspan [niacin er], Sulfa antibiotics, Sulfonamide derivatives, Codeine, Erythromycin, Hydromorphone hcl, Morphine and related, Nalbuphine, Sulfasalazine, and Tape   Review of Systems Review of Systems  Neurological: Positive for weakness.  All other systems reviewed and are negative.    Physical Exam Updated Vital Signs BP (!) 142/54 (BP Location: Right Arm)   Pulse 79   Temp 99 F (37.2 C) (Oral)   Resp 20   Ht 5\' 1"  (1.549 m)   Wt 105.9 kg   SpO2 96%   BMI 44.11 kg/m   Physical Exam Vitals signs and nursing note reviewed.    63 year old female, resting comfortably and in no acute distress. Vital signs are significant for borderline elevated blood pressure. Oxygen saturation is  96%, which is normal. Head is normocephalic and atraumatic. PERRLA, EOMI. Oropharynx is clear. Neck is nontender and supple without adenopathy or JVD. Back is nontender and there is no CVA tenderness. Lungs are clear without rales, wheezes, or rhonchi. Chest is nontender. Heart has regular rate and rhythm without murmur. Abdomen is soft, flat, nontender without masses or hepatosplenomegaly and peristalsis is normoactive. Extremities have no cyanosis or edema, full range of motion is present. Skin is warm and dry without rash. Neurologic: Mental status is normal, cranial nerves are intact, there are no motor or sensory deficits.  ED Treatments / Results   Labs (all labs ordered are listed, but only abnormal results are displayed) Labs Reviewed - No data to display  EKG None  Radiology No results found.  Procedures Procedures   Medications Ordered in ED Medications - No data to display   Initial Impression / Assessment and Plan / ED Course  I have reviewed the triage vital signs and the nursing notes.  Pertinent labs & imaging results that were available during my care of the patient were reviewed by me and considered in my medical decision making (see chart for details).  Influenza-like illness.  In the setting of [id number] pandemic, certainly need to consider COVID-19 as well as pneumonia, influenza.  Will check chest x-ray and will get nasal swabs for testing for coronavirus and influenza.  Old records are reviewed, and she does have 1 prior ED visit for evaluation of weakness.  Chest x-ray shows no evidence of pneumonia.  CBC and metabolic panel are significant only for mild anemia which is at a similar level she has been in the past.  Coronavirus antigen is negative, specimen has been sent for PCR testing.  Patient is stating that she is too ill to go home, she is too weak.  Will try IV fluids.  Urinalysis is still pending. Case is signed out to Dr. Langston Masker.  Katherine [name] was evaluated in Emergency Department on 07/23/2019 for the symptoms described in the history of present illness. She was evaluated in the context of the global COVID-19 pandemic, which necessitated consideration that the patient might be at risk for infection with the SARS-CoV-2 virus that causes COVID-19. Institutional protocols and algorithms that pertain to the evaluation of patients at risk for COVID-19 are in a state of rapid change based on information released by regulatory bodies including the CDC and federal and state organizations. These policies and algorithms were followed during the patient's care in the ED.  Final Clinical Impressions(s) / ED  Diagnoses   Final diagnoses:  None    ED Discharge Orders    None       Delora Fuel, MD 57/01/77 [phone]

## 2019-07-24 ENCOUNTER — Ambulatory Visit (HOSPITAL_COMMUNITY): Payer: Medicare Other

## 2019-07-24 ENCOUNTER — Inpatient Hospital Stay (HOSPITAL_COMMUNITY): Payer: Medicare Other

## 2019-07-24 ENCOUNTER — Ambulatory Visit: Payer: Medicare Other | Admitting: Cardiology

## 2019-07-24 DIAGNOSIS — Z9861 Coronary angioplasty status: Secondary | ICD-10-CM | POA: Diagnosis not present

## 2019-07-24 DIAGNOSIS — I482 Chronic atrial fibrillation, unspecified: Secondary | ICD-10-CM | POA: Diagnosis present

## 2019-07-24 DIAGNOSIS — R531 Weakness: Secondary | ICD-10-CM | POA: Diagnosis not present

## 2019-07-24 DIAGNOSIS — Z6841 Body Mass Index (BMI) 40.0 and over, adult: Secondary | ICD-10-CM | POA: Diagnosis not present

## 2019-07-24 DIAGNOSIS — I480 Paroxysmal atrial fibrillation: Secondary | ICD-10-CM | POA: Diagnosis present

## 2019-07-24 DIAGNOSIS — J9601 Acute respiratory failure with hypoxia: Secondary | ICD-10-CM | POA: Diagnosis present

## 2019-07-24 DIAGNOSIS — M797 Fibromyalgia: Secondary | ICD-10-CM | POA: Diagnosis present

## 2019-07-24 DIAGNOSIS — H538 Other visual disturbances: Secondary | ICD-10-CM | POA: Diagnosis present

## 2019-07-24 DIAGNOSIS — I5032 Chronic diastolic (congestive) heart failure: Secondary | ICD-10-CM | POA: Diagnosis present

## 2019-07-24 DIAGNOSIS — K219 Gastro-esophageal reflux disease without esophagitis: Secondary | ICD-10-CM | POA: Diagnosis present

## 2019-07-24 DIAGNOSIS — Z20828 Contact with and (suspected) exposure to other viral communicable diseases: Secondary | ICD-10-CM | POA: Diagnosis present

## 2019-07-24 DIAGNOSIS — E785 Hyperlipidemia, unspecified: Secondary | ICD-10-CM | POA: Diagnosis present

## 2019-07-24 DIAGNOSIS — R0602 Shortness of breath: Secondary | ICD-10-CM

## 2019-07-24 DIAGNOSIS — G43909 Migraine, unspecified, not intractable, without status migrainosus: Secondary | ICD-10-CM | POA: Diagnosis present

## 2019-07-24 DIAGNOSIS — M069 Rheumatoid arthritis, unspecified: Secondary | ICD-10-CM | POA: Diagnosis present

## 2019-07-24 DIAGNOSIS — I252 Old myocardial infarction: Secondary | ICD-10-CM | POA: Diagnosis not present

## 2019-07-24 DIAGNOSIS — I2510 Atherosclerotic heart disease of native coronary artery without angina pectoris: Secondary | ICD-10-CM | POA: Diagnosis present

## 2019-07-24 DIAGNOSIS — I132 Hypertensive heart and chronic kidney disease with heart failure and with stage 5 chronic kidney disease, or end stage renal disease: Secondary | ICD-10-CM | POA: Diagnosis present

## 2019-07-24 DIAGNOSIS — Z7901 Long term (current) use of anticoagulants: Secondary | ICD-10-CM | POA: Diagnosis not present

## 2019-07-24 DIAGNOSIS — I69398 Other sequelae of cerebral infarction: Secondary | ICD-10-CM | POA: Diagnosis not present

## 2019-07-24 DIAGNOSIS — J45901 Unspecified asthma with (acute) exacerbation: Secondary | ICD-10-CM | POA: Diagnosis present

## 2019-07-24 DIAGNOSIS — E1151 Type 2 diabetes mellitus with diabetic peripheral angiopathy without gangrene: Secondary | ICD-10-CM | POA: Diagnosis present

## 2019-07-24 DIAGNOSIS — Z8614 Personal history of Methicillin resistant Staphylococcus aureus infection: Secondary | ICD-10-CM | POA: Diagnosis not present

## 2019-07-24 DIAGNOSIS — Z951 Presence of aortocoronary bypass graft: Secondary | ICD-10-CM | POA: Diagnosis not present

## 2019-07-24 DIAGNOSIS — Z940 Kidney transplant status: Secondary | ICD-10-CM | POA: Diagnosis not present

## 2019-07-24 DIAGNOSIS — I152 Hypertension secondary to endocrine disorders: Secondary | ICD-10-CM | POA: Diagnosis present

## 2019-07-24 LAB — BASIC METABOLIC PANEL
Anion gap: 13 (ref 5–15)
BUN: 12 mg/dL (ref 8–23)
CO2: 21 mmol/L — ABNORMAL LOW (ref 22–32)
Calcium: 8.7 mg/dL — ABNORMAL LOW (ref 8.9–10.3)
Chloride: 104 mmol/L (ref 98–111)
Creatinine, Ser: 1.01 mg/dL — ABNORMAL HIGH (ref 0.44–1.00)
GFR calc Af Amer: >60 mL/min (ref >60)
GFR calc non Af Amer: 60 mL/min — ABNORMAL LOW (ref >60)
Glucose, Bld: 158 mg/dL — ABNORMAL HIGH (ref 70–99)
Potassium: 4.5 mmol/L (ref 3.5–5.1)
Sodium: 138 mmol/L (ref 135–145)

## 2019-07-24 LAB — CBC
HCT: 29.5 % — ABNORMAL LOW (ref 36.0–46.0)
Hemoglobin: 9.2 g/dL — ABNORMAL LOW (ref 12.0–15.0)
MCH: 30.8 pg (ref 26.0–34.0)
MCHC: 31.2 g/dL (ref 30.0–36.0)
MCV: 98.7 fL (ref 80.0–100.0)
Platelets: 234 10*3/uL (ref 150–400)
RBC: 2.99 MIL/uL — ABNORMAL LOW (ref 3.87–5.11)
RDW: 17.1 % — ABNORMAL HIGH (ref 11.5–15.5)
WBC: 12.2 10*3/uL — ABNORMAL HIGH (ref 4.0–10.5)
nRBC: 0 % (ref 0.0–0.2)

## 2019-07-24 LAB — GLUCOSE, CAPILLARY
Glucose-Capillary: 131 mg/dL — ABNORMAL HIGH (ref 70–99)
Glucose-Capillary: 145 mg/dL — ABNORMAL HIGH (ref 70–99)
Glucose-Capillary: 200 mg/dL — ABNORMAL HIGH (ref 70–99)
Glucose-Capillary: 144 mg/dL — ABNORMAL HIGH (ref 70–99)

## 2019-07-24 LAB — PROTIME-INR
INR: 4.4 (ref 0.8–1.2)
Prothrombin Time: 41.7 seconds — ABNORMAL HIGH (ref 11.4–15.2)

## 2019-07-24 LAB — ECHOCARDIOGRAM COMPLETE
Height: 61 in
Weight: 3837.77 oz

## 2019-07-24 LAB — MAGNESIUM: Magnesium: 1.8 mg/dL (ref 1.7–2.4)

## 2019-07-24 LAB — SARS CORONAVIRUS 2 (TAT 6-24 HRS): SARS Coronavirus 2: NEGATIVE

## 2019-07-24 MED ORDER — IPRATROPIUM-ALBUTEROL 0.5-2.5 (3) MG/3ML IN SOLN
3.0000 mL | Freq: Three times a day (TID) | RESPIRATORY_TRACT | Status: DC
  Administered 2019-07-25: 3 mL via RESPIRATORY_TRACT
  Filled 2019-07-24: qty 3

## 2019-07-24 MED ORDER — MAGNESIUM SULFATE 2 GM/50ML IV SOLN
2.0000 g | Freq: Once | INTRAVENOUS | Status: AC
  Administered 2019-07-24: 2 g via INTRAVENOUS
  Filled 2019-07-24: qty 50

## 2019-07-24 MED ORDER — OXYCODONE HCL 5 MG PO TABS
5.0000 mg | ORAL_TABLET | Freq: Three times a day (TID) | ORAL | Status: DC | PRN
  Administered 2019-07-24 – 2019-07-28 (×8): 5 mg via ORAL
  Filled 2019-07-24 (×8): qty 1

## 2019-07-24 MED ORDER — CARBAMAZEPINE 200 MG PO TABS
200.0000 mg | ORAL_TABLET | Freq: Two times a day (BID) | ORAL | Status: DC
  Administered 2019-07-24 – 2019-07-28 (×9): 200 mg via ORAL
  Filled 2019-07-24 (×9): qty 1

## 2019-07-24 MED ORDER — NYSTATIN 100000 UNIT/ML MT SUSP
5.0000 mL | Freq: Four times a day (QID) | OROMUCOSAL | Status: DC
  Administered 2019-07-24 – 2019-07-28 (×16): 500000 [IU] via ORAL
  Filled 2019-07-24 (×18): qty 5

## 2019-07-24 MED ORDER — WARFARIN - PHARMACIST DOSING INPATIENT
Freq: Every day | Status: DC
  Administered 2019-07-26 – 2019-07-27 (×2)

## 2019-07-24 NOTE — Progress Notes (Signed)
PROGRESS NOTE    Katherine [name]  KTG:[id number] DOB: 30-Dec-1955 DOA: 07/23/2019 PCP: [name], MD   Brief Narrative: 63 year old with past medical history significant for diabetes type 2, hypertension, hyperlipidemia, RA, CAD status post CABG, ESRD status post renal transplant on chronic immunosuppression )Imuran and prednisone) who presents with flulike illness for 3 days prior to admission with weakness and shortness of breath. She reports shortness of breath is helped by her inhaled.  Denies chest pain. Patient in the ED chest x-ray no active lung disease, influenza negative COVID-19 negative.  Troponin  24..28  Assessment & Plan:   Active Problems:   Extrinsic asthma   Morbid obesity - s/p Lap Band 9/'05   Hypertension associated with diabetes ([place])   S/P kidney transplant   Long term (current) use of anticoagulants   Asthma exacerbation   ESRD (end stage renal disease) (HCC)   OSA (obstructive sleep apnea)   Current chronic use of systemic steroids   1-Acute Hypoxic Respiratory Failure; Could be related to Asthma exacerbation, versus possible pneumonia. -Covid 19 x1 Negative, influenza panel negative. -Continue with prednisone. -Repeat COVID-19 test. -Continue with ceftriaxone and doxycycline for 24 hours.  Will repeat chest x-ray tomorrow. -Sputum culture ordered. -Continue with singular Symbicort and scheduled duo nebs  2-A. fib: Continue with Tikosyn, metoprolol diltiazem and Monitor EKG INR supratherapeutic at 4.4.  Pharmacy to adjust Coumadin.  3-Hypomagnesemia: Received 1 g magnesium sulfate on 12/6. Mag level increased to 1.8.  Will repeat another gram.  4-Chronic pain: Holding oxycodone because patient was somnolent.  Diabetes type 2: Continue with Lantus. Continue to hold oral hypoglycemic agents.  GERD: Continue with PPI  Weakness: PT OT eval.  History of renal transplant, prior history of ESRD: Creatinine mildly increased today of 1.1.  Discussed with nephrology continue to monitor. Continue with Imuran and prednisone.  Mild elevation of troponin: Might be related to acute illness.  Check 2D echo.  Others; resume tegretol.   Estimated body mass index is 45.32 kg/m as calculated from the following:   Height as of this encounter: 5\' 1"  (1.549 m).   Weight as of this encounter: 108.8 kg.   DVT prophylaxis: Coumadin Code Status: Full code Family Communication:  Care discussed with patient.  Disposition Plan: remain in the hospital for treatment of PNA, and monitor renal function Consultants:   none  Procedures:   ECHO  Antimicrobials:  Ceftriaxone 12-06 Doxy 12-06  Subjective: Sweating, report improvement of dyspnea, but she is not breathing at baseline.  Denies chest pain.  Report productive cough.   Objective: Vitals:   07/24/19 0553 07/24/19 0802 07/24/19 0830 07/24/19 1000  BP: 137/66 (!) 161/77    Pulse: 91 92 90   Resp: 18 18 (!) 22   Temp: 99.1 F (37.3 C) 98.8 F (37.1 C)  98.4 F (36.9 C)  TempSrc: Oral Oral  Oral  SpO2: 93% 99% 97%   Weight:      Height:        Intake/Output Summary (Last 24 hours) at 07/24/2019 1241 Last data filed at 07/24/2019 1230 Gross per 24 hour  Intake 915.87 ml  Output 1400 ml  Net -484.13 ml   Filed Weights   07/23/19 0400 07/23/19 1820  Weight: 105.9 kg 108.8 kg    Examination:  General exam: ill appearing Respiratory system: no wheezing. Respiratory effort normal. Cardiovascular system: S1 & S2 heard, RRR.  Gastrointestinal system: Abdomen is nondistended, soft and nontender. No organomegaly or masses felt. Normal  bowel sounds heard. Central nervous system: Alert and oriented. No focal neurological deficits. Extremities: Symmetric 5 x 5 power. Skin: No rashes, lesions or ulcers    Data Reviewed: I have personally reviewed following labs and imaging studies  CBC: Recent Labs  Lab 07/23/19 0452 07/23/19 1830 07/24/19 0626  WBC 10.4  11.2* 12.2*  NEUTROABS 8.2* 8.9*  --   HGB 10.1* 9.7* 9.2*  HCT 31.9* 31.1* 29.5*  MCV 99.7 99.7 98.7  PLT 246 217 751   Basic Metabolic Panel: Recent Labs  Lab 07/23/19 0452 07/23/19 1830 07/24/19 0626  NA 135 134* 138  K 4.1 4.1 4.5  CL 102 101 104  CO2 24 23 21*  GLUCOSE 159* 114* 158*  BUN 10 8 12   CREATININE 0.96 0.95 1.01*  CALCIUM 8.9 8.7* 8.7*  MG  --  1.5* 1.8  PHOS  --  3.9  --    GFR: Estimated Creatinine Clearance: 65.8 mL/min (A) (by C-G formula based on SCr of 1.01 mg/dL (H)). Liver Function Tests: Recent Labs  Lab 07/23/19 0452  AST 17  ALT 10  ALKPHOS 112  BILITOT 0.5  PROT 6.6  ALBUMIN 2.8*   No results for input(s): LIPASE, AMYLASE in the last 168 hours. No results for input(s): AMMONIA in the last 168 hours. Coagulation Profile: Recent Labs  Lab 07/23/19 1830 07/24/19 0626  INR 3.6* 4.4*   Cardiac Enzymes: No results for input(s): CKTOTAL, CKMB, CKMBINDEX, TROPONINI in the last 168 hours. BNP (last 3 results) No results for input(s): PROBNP in the last 8760 hours. HbA1C: Recent Labs    07/23/19 1830  HGBA1C 6.6*   CBG: Recent Labs  Lab 07/23/19 1822 07/23/19 2020 07/24/19 0650 07/24/19 1135  GLUCAP 109* 104* 131* 145*   Lipid Profile: No results for input(s): CHOL, HDL, LDLCALC, TRIG, CHOLHDL, LDLDIRECT in the last 72 hours. Thyroid Function Tests: No results for input(s): TSH, T4TOTAL, FREET4, T3FREE, THYROIDAB in the last 72 hours. Anemia Panel: No results for input(s): VITAMINB12, FOLATE, FERRITIN, TIBC, IRON, RETICCTPCT in the last 72 hours. Sepsis Labs: Recent Labs  Lab 07/23/19 0453 07/23/19 0608 07/23/19 1830  PROCALCITON  --   --  0.45  LATICACIDVEN 1.6 1.2  --     Recent Results (from the past 240 hour(s))  SARS CORONAVIRUS 2 (TAT 6-24 HRS) Nasopharyngeal Nasopharyngeal Swab     Status: None   Collection Time: 07/23/19  6:05 AM   Specimen: Nasopharyngeal Swab  Result Value Ref Range Status   SARS  Coronavirus 2 NEGATIVE NEGATIVE Final    Comment: (NOTE) SARS-CoV-2 target nucleic acids are NOT DETECTED. The SARS-CoV-2 RNA is generally detectable in upper and lower respiratory specimens during the acute phase of infection. Negative results do not preclude SARS-CoV-2 infection, do not rule out co-infections with other pathogens, and should not be used as the sole basis for treatment or other patient management decisions. Negative results must be combined with clinical observations, patient history, and epidemiological information. The expected result is Negative. Fact Sheet for Patients: SugarRoll.be Fact Sheet for Healthcare Providers: https://www.woods-mathews.com/ This test is not yet approved or cleared by the [place] FDA and  has been authorized for detection and/or diagnosis of SARS-CoV-2 by FDA under an Emergency Use Authorization (EUA). This EUA will remain  in effect (meaning this test can be used) for the duration of the COVID-19 declaration under Section 56 4(b)(1) of the Act, 21 U.S.C. section 360bbb-3(b)(1), unless the authorization is terminated or revoked sooner. Performed at [facility]  [facility], [place] [address], [place], [place] [address]          Radiology Studies: Dg Chest Port 1 View  Result Date: 07/23/2019 CLINICAL DATA:  Shortness of breath and generalized weakness for 2 weeks. Chest pain. Nausea and vomiting. EXAM: PORTABLE CHEST 1 VIEW COMPARISON:  12/19/2018 FINDINGS: Stable mild cardiomegaly and prior CABG. Mild scarring seen in the left midlung. Chronic pulmonary interstitial prominence noted. No evidence of acute infiltrate or edema. No evidence of pleural effusion. IMPRESSION: Stable mild cardiomegaly. No active lung disease. Electronically Signed   By: Marlaine Hind M.D.   On: 07/23/2019 04:34        Scheduled Meds: . azaTHIOprine  125 mg Oral QPM  . diltiazem  120 mg Oral Daily  . dofetilide   250 mcg Oral BID  . DULoxetine  60 mg Oral Daily  . fluticasone furoate-vilanterol  1 puff Inhalation Daily  . insulin aspart  0-20 Units Subcutaneous TID WC  . insulin glargine  6 Units Subcutaneous Daily  . ipratropium-albuterol  3 mL Nebulization Q6H  . metoprolol tartrate  25 mg Oral BID  . montelukast  10 mg Oral Daily  . nystatin  5 mL Oral QID  . pantoprazole  40 mg Oral Daily  . predniSONE  30 mg Oral Q breakfast  . rosuvastatin  20 mg Oral Daily  . Warfarin - Physician Dosing Inpatient   Does not apply q1800   Continuous Infusions: . sodium chloride Stopped (07/24/19 0826)  . cefTRIAXone (ROCEPHIN)  IV Stopped (07/24/19 0001)  . doxycycline (VIBRAMYCIN) IV Stopped (07/24/19 0732)     LOS: 0 days    Time spent: 35 minutes.     Elmarie Shiley, MD [facility]  If 7PM-7AM, please contact night-coverage [email or web address] Password [facility] 07/24/2019, 12:41 PM

## 2019-07-24 NOTE — Progress Notes (Addendum)
PT Cancellation Note  Patient Details Name: Katherine [name] MRN: [id number] DOB: [birth year]   Cancelled Treatment:    Reason Eval/Treat Not Completed: Pain limiting ability to participate. When PT arrived, pt supine in bed with ice pack over eyes. Pt reports having a "terrible migraine" and declines participating with PT at this time. Alerted RN that pt requesting pain medication. Will continue to follow and initiate PT evaluation when pt agreeable.    Thelma Comp 07/24/2019, 2:48 PM   Rolinda Roan, PT, DPT Acute Rehabilitation Services Pager: [phone] Office: [phone]

## 2019-07-24 NOTE — Progress Notes (Signed)
  Echocardiogram 2D Echocardiogram has been performed.  Nashya Garlington A Brittane Grudzinski 07/24/2019, 5:32 PM

## 2019-07-24 NOTE — Progress Notes (Signed)
ANTICOAGULATION CONSULT NOTE - Initial Consult  Pharmacy Consult for warfarin Indication: afib  Allergies  Allergen Reactions  . Tetracycline Hives  . Hydromorphone Hcl Nausea And Vomiting  . Niacin Other (See Comments)    Mouth blisters  . Niaspan [Niacin Er] Other (See Comments)    Mouth blisters  . Sulfa Antibiotics Nausea Only and Other (See Comments)    "Tears up stomach"  . Sulfonamide Derivatives Other (See Comments)    Reaction: per patient "tears her stomach up"  . Codeine Nausea And Vomiting  . Erythromycin Nausea And Vomiting  . Hydromorphone Hcl Nausea And Vomiting  . Morphine And Related Nausea And Vomiting  . Nalbuphine Nausea And Vomiting  . Sulfasalazine Nausea Only and Other (See Comments)    per patient "tears her stomach up", "Tears up stomach"  . Tape Rash and Other (See Comments)    No "plastic" tape," please    Patient Measurements: Height: 5\' 1"  (154.9 cm) Weight: 239 lb 13.8 oz (108.8 kg) IBW/kg (Calculated) : 47.8   Vital Signs: Temp: 98.4 F (36.9 C) (12/07 1000) Temp Source: Oral (12/07 1000) BP: 161/77 (12/07 0802) Pulse Rate: 92 (12/07 1334)  Labs: Recent Labs    07/23/19 0452 07/23/19 0702 07/23/19 1830 07/23/19 2229 07/24/19 0626  HGB 10.1*  --  9.7*  --  9.2*  HCT 31.9*  --  31.1*  --  29.5*  PLT 246  --  217  --  234  LABPROT  --   --  36.2*  --  41.7*  INR  --   --  3.6*  --  4.4*  CREATININE 0.96  --  0.95  --  1.01*  TROPONINIHS  --  24* 28* 30*  --     Estimated Creatinine Clearance: 65.8 mL/min (A) (by C-G formula based on SCr of 1.01 mg/dL (H)).   Medical History: Past Medical History:  Diagnosis Date  . Anemia   . Anxiety   . Bilateral carotid artery stenosis    Carotid duplex 02/6159: 7-37% LICA, 10-62% RICA, >69% RECA, f/u 1 yr suggested  . CAD (coronary artery disease) of bypass graft 5/01; 3/'02, 8/'03, 10/'04; 1/15   PCI x 5 to SVG-D1   . CAD in native artery 07/1993    3 Vessel Disease (LAD-D1 & RCA)  -- CABG  . CAD S/P percutaneous coronary angioplasty    PCI to SVG-D1 insertion/native D1 x 4 = '01 -(S660 BMS 2.5 x 9 - insertion into D1; '02 - distal overlap ACS Pixel 2.5 x 8  BMS; '03 distal/native ISR/Thrombosis - Pixel 2.5 x 13; '04 - ISR-  Taxus 2.5 x 20 (covered all);; 1/15 - mid SVG-D1 (50% distal ISR) - Promus P 2.75 x 20 -- 2.8 mm  . COPD mixed type ([place])    Followed by Dr. Lamonte Sakai "pulmonologist said no COPD"  . Depression   . Depression with anxiety   . Diabetes mellitus type 2 in obese ([place])   . Diarrhea    started after cholecystectomy and mass removed from intestine  . Dyslipidemia, goal LDL below 70    08/2012: TC 137, TG 200, HDL 32!, LDL 45; on statin (followed by Dr.Deterding)  . ESRD (end stage renal disease) ([place]) 1991   s/p Cadaveric Renal Transplant [facility] - Dr. Jimmy Footman)   . Family history of adverse reaction to anesthesia    mom's bp dropped during/after anesthesia  . Fibromyalgia   . GERD (gastroesophageal reflux disease)   . Glomerulonephritis, chronic, rapidly  progressive 1989  . 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 07/1993   Rescue PTCA of RCA -- referred for CABG.  . H/O: GI bleed   . Headache    migraines in the past  . History of kidney stones   . Hypertension associated with diabetes ([place])   . Morbid obesity ([place])   . MRSA (methicillin resistant staph aureus) culture positive   . OSA (obstructive sleep apnea)    no longer on CPAP or home O2, states she doesn't need now after lap band  . PAD (peripheral artery disease) ([place]) 08/2013   LEA Dopplers to be read by Dr. Fletcher Anon  . PAF (paroxysmal atrial fibrillation) ([place]) 06/2014   Noted on CardioNet Monitor  - rate ~112; no recurrent symptoms. Nonionic regulation because of frequent GI procedures. Patient prefers Plavix  . Pneumonia   . Recurrent boils    Bilateral Groin  . Rheumatoid arthritis ([place])    Per Patient Report; associated with OA  . 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 Servando Snare: LIMA-LAD, SVG-bifurcatingD1, SVG-rPDA  . S/p cadaver renal transplant [place]  . Stroke [facility]) 2012   "right eye stroke- half blind now"  . Unstable angina ([place]) 5/01; 3/'02, 8/'03, 10/'04; 1/15   x 5 occurences since Inf-Post STEMI in 1994   Assessment: 63 yo female with h/o afib on warfarin. PTA warfarin dose 10mg  daily except 5mg  on Sun on which the patients previous INR was stable at 2. Last dose PTA 12/5. INR on admit 3.6. Warfarin 10mg  given last pm. INR is 4.4 today. No bleeding noted.   Goal of Therapy:  INR 2-3 Monitor platelets by anticoagulation protocol: Yes   Plan:  Hold warfarin tonight Daily INR Monitor for bleeding  Marianne Golightly A. Levada Dy, PharmD, BCPS, FNKF Clinical Pharmacist [place] Please utilize Amion for appropriate phone number to reach the unit pharmacist ([place])   07/24/2019,1:38 PM

## 2019-07-24 NOTE — Plan of Care (Signed)
  Problem: Education: Goal: Knowledge of General Education information will improve Description: Including pain rating scale, medication(s)/side effects and non-pharmacologic comfort measures Outcome: Progressing   Problem: Clinical Measurements: Goal: Diagnostic test results will improve Outcome: Progressing   Problem: Coping: Goal: Level of anxiety will decrease Outcome: Progressing   

## 2019-07-25 ENCOUNTER — Inpatient Hospital Stay (HOSPITAL_COMMUNITY): Payer: Medicare Other

## 2019-07-25 DIAGNOSIS — J9601 Acute respiratory failure with hypoxia: Secondary | ICD-10-CM

## 2019-07-25 DIAGNOSIS — J9602 Acute respiratory failure with hypercapnia: Secondary | ICD-10-CM

## 2019-07-25 LAB — CBC
HCT: 28.1 % — ABNORMAL LOW (ref 36.0–46.0)
Hemoglobin: 9.1 g/dL — ABNORMAL LOW (ref 12.0–15.0)
MCH: 31.4 pg (ref 26.0–34.0)
MCHC: 32.4 g/dL (ref 30.0–36.0)
MCV: 96.9 fL (ref 80.0–100.0)
Platelets: 232 10*3/uL (ref 150–400)
RBC: 2.9 MIL/uL — ABNORMAL LOW (ref 3.87–5.11)
RDW: 16.7 % — ABNORMAL HIGH (ref 11.5–15.5)
WBC: 8 10*3/uL (ref 4.0–10.5)
nRBC: 0.3 % — ABNORMAL HIGH (ref 0.0–0.2)

## 2019-07-25 LAB — BASIC METABOLIC PANEL
Anion gap: 12 (ref 5–15)
BUN: 16 mg/dL (ref 8–23)
CO2: 21 mmol/L — ABNORMAL LOW (ref 22–32)
Calcium: 8.7 mg/dL — ABNORMAL LOW (ref 8.9–10.3)
Chloride: 103 mmol/L (ref 98–111)
Creatinine, Ser: 0.83 mg/dL (ref 0.44–1.00)
GFR calc Af Amer: >60 mL/min (ref >60)
GFR calc non Af Amer: >60 mL/min (ref >60)
Glucose, Bld: 207 mg/dL — ABNORMAL HIGH (ref 70–99)
Potassium: 4.2 mmol/L (ref 3.5–5.1)
Sodium: 136 mmol/L (ref 135–145)

## 2019-07-25 LAB — GLUCOSE, CAPILLARY
Glucose-Capillary: 173 mg/dL — ABNORMAL HIGH (ref 70–99)
Glucose-Capillary: 180 mg/dL — ABNORMAL HIGH (ref 70–99)
Glucose-Capillary: 285 mg/dL — ABNORMAL HIGH (ref 70–99)
Glucose-Capillary: 257 mg/dL — ABNORMAL HIGH (ref 70–99)

## 2019-07-25 LAB — MAGNESIUM: Magnesium: 2 mg/dL (ref 1.7–2.4)

## 2019-07-25 LAB — PROTIME-INR
INR: 4.6 (ref 0.8–1.2)
Prothrombin Time: 43.2 seconds — ABNORMAL HIGH (ref 11.4–15.2)

## 2019-07-25 MED ORDER — FUROSEMIDE 10 MG/ML IJ SOLN
40.0000 mg | Freq: Once | INTRAMUSCULAR | Status: AC
  Administered 2019-07-25: 40 mg via INTRAVENOUS
  Filled 2019-07-25: qty 4

## 2019-07-25 MED ORDER — IPRATROPIUM-ALBUTEROL 0.5-2.5 (3) MG/3ML IN SOLN
3.0000 mL | Freq: Two times a day (BID) | RESPIRATORY_TRACT | Status: DC
  Administered 2019-07-25 – 2019-07-28 (×6): 3 mL via RESPIRATORY_TRACT
  Filled 2019-07-25 (×6): qty 3

## 2019-07-25 NOTE — Progress Notes (Signed)
ANTICOAGULATION CONSULT NOTE - Initial Consult  Pharmacy Consult for warfarin Indication: afib  Allergies  Allergen Reactions  . Tetracycline Hives  . Hydromorphone Hcl Nausea And Vomiting  . Niacin Other (See Comments)    Mouth blisters  . Niaspan [Niacin Er] Other (See Comments)    Mouth blisters  . Sulfa Antibiotics Nausea Only and Other (See Comments)    "Tears up stomach"  . Sulfonamide Derivatives Other (See Comments)    Reaction: per patient "tears her stomach up"  . Codeine Nausea And Vomiting  . Erythromycin Nausea And Vomiting  . Hydromorphone Hcl Nausea And Vomiting  . Morphine And Related Nausea And Vomiting  . Nalbuphine Nausea And Vomiting  . Sulfasalazine Nausea Only and Other (See Comments)    per patient "tears her stomach up", "Tears up stomach"  . Tape Rash and Other (See Comments)    No "plastic" tape," please    Patient Measurements: Height: 5\' 1"  (154.9 cm) Weight: 238 lb 9.6 oz (108.2 kg) IBW/kg (Calculated) : 47.8   Vital Signs: Temp: 98.3 F (36.8 C) (12/08 0352) Temp Source: Oral (12/08 0352) BP: 149/61 (12/08 0352) Pulse Rate: 70 (12/08 0352)  Labs: Recent Labs    07/23/19 0702 07/23/19 1830 07/23/19 2229 07/24/19 0626 07/25/19 0620  HGB  --  9.7*  --  9.2* 9.1*  HCT  --  31.1*  --  29.5* 28.1*  PLT  --  217  --  234 232  LABPROT  --  36.2*  --  41.7* 43.2*  INR  --  3.6*  --  4.4* 4.6*  CREATININE  --  0.95  --  1.01* 0.83  TROPONINIHS 24* 28* 30*  --   --     Estimated Creatinine Clearance: 79.9 mL/min (by C-G formula based on SCr of 0.83 mg/dL).   Medical History: Past Medical History:  Diagnosis Date  . Anemia   . Anxiety   . Bilateral carotid artery stenosis    Carotid duplex 09/5425: 0-62% LICA, 37-62% RICA, >83% RECA, f/u 1 yr suggested  . CAD (coronary artery disease) of bypass graft 5/01; 3/'02, 8/'03, 10/'04; 1/15   PCI x 5 to SVG-D1   . CAD in native artery 07/1993    3 Vessel Disease (LAD-D1 & RCA) -- CABG   . CAD S/P percutaneous coronary angioplasty    PCI to SVG-D1 insertion/native D1 x 4 = '01 -(S660 BMS 2.5 x 9 - insertion into D1; '02 - distal overlap ACS Pixel 2.5 x 8  BMS; '03 distal/native ISR/Thrombosis - Pixel 2.5 x 13; '04 - ISR-  Taxus 2.5 x 20 (covered all);; 1/15 - mid SVG-D1 (50% distal ISR) - Promus P 2.75 x 20 -- 2.8 mm  . COPD mixed type ([place])    Followed by Dr. Lamonte Sakai "pulmonologist said no COPD"  . Depression   . Depression with anxiety   . Diabetes mellitus type 2 in obese ([place])   . Diarrhea    started after cholecystectomy and mass removed from intestine  . Dyslipidemia, goal LDL below 70    08/2012: TC 137, TG 200, HDL 32!, LDL 45; on statin (followed by Dr.Deterding)  . ESRD (end stage renal disease) ([place]) 1991   s/p Cadaveric Renal Transplant [facility] - Dr. Jimmy Footman)   . Family history of adverse reaction to anesthesia    mom's bp dropped during/after anesthesia  . Fibromyalgia   . GERD (gastroesophageal reflux disease)   . Glomerulonephritis, chronic, rapidly progressive 49  . H/O ST  elevation myocardial infarction (STEMI) of inferoposterior wall 07/1993   Rescue PTCA of RCA -- referred for CABG.  . H/O: GI bleed   . Headache    migraines in the past  . History of kidney stones   . Hypertension associated with diabetes ([place])   . Morbid obesity ([place])   . MRSA (methicillin resistant staph aureus) culture positive   . OSA (obstructive sleep apnea)    no longer on CPAP or home O2, states she doesn't need now after lap band  . PAD (peripheral artery disease) ([place]) 08/2013   LEA Dopplers to be read by Dr. Fletcher Anon  . PAF (paroxysmal atrial fibrillation) ([place]) 06/2014   Noted on CardioNet Monitor  - rate ~112; no recurrent symptoms. Nonionic regulation because of frequent GI procedures. Patient prefers Plavix  . Pneumonia   . Recurrent boils    Bilateral Groin  . Rheumatoid arthritis ([place])    Per Patient Report; associated with OA  . 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  Servando Snare: LIMA-LAD, SVG-bifurcatingD1, SVG-rPDA  . S/p cadaver renal transplant [place]  . Stroke [facility]) 2012   "right eye stroke- half blind now"  . Unstable angina ([place]) 5/01; 3/'02, 8/'03, 10/'04; 1/15   x 5 occurences since Inf-Post STEMI in 1994   Assessment: 63 yo female with h/o afib on warfarin. PTA warfarin dose 10mg  daily except 5mg  on Sun on which the patients previous INR was stable at 2. Last dose PTA 12/5. INR on admit 3.6. Warfarin 10mg  given last pm. INR 4.4>>4.6 today. No bleeding noted.   Goal of Therapy:  INR 2-3 Monitor platelets by anticoagulation protocol: Yes   Plan:  Hold warfarin tonight Daily INR Monitor for bleeding  Dalayla Aldredge A. Levada Dy, PharmD, BCPS, FNKF Clinical Pharmacist [place] Please utilize Amion for appropriate phone number to reach the unit pharmacist ([place])   07/25/2019,8:28 AM

## 2019-07-25 NOTE — Progress Notes (Signed)
PROGRESS NOTE    Katherine [name]  QVZ:[id number] DOB: 1955/11/29 DOA: 07/23/2019 PCP: [name], MD   Brief Narrative:  63 year old with history of DM2, HTN, HLD, rheumatoid arthritis, CAD status post CABG, ESRD status post renal transplant on chronic immunosuppression-Imuran/prednisone presented with URI symptoms and weakness.  COVID-19 was tested negative.  Chest x-ray did not show any active lung disease.   Assessment & Plan:   Active Problems:   Extrinsic asthma   Morbid obesity - s/p Lap Band 9/'05   Hypertension associated with diabetes ([place])   S/P kidney transplant   Long term (current) use of anticoagulants   Asthma exacerbation   ESRD (end stage renal disease) (HCC)   OSA (obstructive sleep apnea)   Current chronic use of systemic steroids  Acute respiratory distress with mild hypoxia, 3 L nasal cannula -COVID-19-X2-negative, influenza-negative -Currently on Rocephin and doxycycline.  Will complete 5-day course.  Follow-up chest x-ray -Bronchodilators, incentive spirometer and flutter valve. Lasix 40mg  IV once.  -Follow-up sputum culture -On p.o. prednisone -Procalcitonin 0.45.  WBC trending down. -Echocardiogram 55 to 32%, chronic diastolic congestive heart failure grade 2  Atrial fibrillation, chronic Grade 2 diastolic dysfunction, ejection fraction 55 to 60% -On Tikosyn, metoprolol and Cardizem. -Coumadin.  Supratherapeutic INR, pharmacy to manage -Crestor 20 mg daily.  Metoprolol 25 mg p.o. twice daily  Chronic pain -On oxycodone at home, continue to closely monitor her mentation  Insulin-dependent diabetes mellitus type 2 -Continue Lantus.  Insulin sliding scale and Accu-Chek  History of ESRD now with renal transplant Chronic immunosuppression -On Imuran and prednisone.  Discussed by previous provider with nephrology who advised to continue to monitor this  GERD -PPI  Generalized weakness -PT/OT-pending    DVT prophylaxis: Coumadin Code  Status: Full Family Communication:  [place], no answer  Disposition Plan: Maintain hosp stay until we are after to get her off O2 and then get PT/OT    Subjective: Having headache and asking for oxycodone.  I explained her she did not get this yesterday because she was very drowsy and lethargic.  She is refusing this but nursing staff at bedside confirms that she was very drowsy and lethargic yesterday morning after getting oxycodone.  This morning feels little anxious because of her headache and some exertional dyspnea.  Review of Systems Otherwise negative except as per HPI, including: General: Denies fever, chills, night sweats or unintended weight loss. Resp: Denies cough, wheezing Cardiac: Denies chest pain, palpitations, orthopnea, paroxysmal nocturnal dyspnea. GI: Denies abdominal pain, nausea, vomiting, diarrhea or constipation GU: Denies dysuria, frequency, hesitancy or incontinence MS: Denies muscle aches, joint pain or swelling Neuro: Denies  neurologic deficits (focal weakness, numbness, tingling), abnormal gait Psych: Denies anxiety, depression, SI/HI/AVH Skin: Denies new rashes or lesions ID: Denies sick contacts, exotic exposures, travel  Objective: Vitals:   07/24/19 1622 07/24/19 2006 07/24/19 2055 07/25/19 0352  BP: 136/62  (!) 154/69 (!) 149/61  Pulse: 79  80 70  Resp: 20  15 16   Temp: 99.4 F (37.4 C)  98.4 F (36.9 C) 98.3 F (36.8 C)  TempSrc: Oral  Oral Oral  SpO2: 94% 95% 98% 97%  Weight:    108.2 kg  Height:        Intake/Output Summary (Last 24 hours) at 07/25/2019 0843 Last data filed at 07/25/2019 0800 Gross per 24 hour  Intake 1238.86 ml  Output 1750 ml  Net -511.14 ml   Filed Weights   07/23/19 0400 07/23/19 1820 07/25/19 0352  Weight:  105.9 kg 108.8 kg 108.2 kg    Examination:  General exam: Appears calm and comfortable, 2 L nasal cannula Respiratory system: Mild expiratory wheezing Cardiovascular system: S1 & S2 heard, RRR.  No JVD, murmurs, rubs, gallops or clicks. No pedal edema. Gastrointestinal system: Abdomen is nondistended, soft and nontender. No organomegaly or masses felt. Normal bowel sounds heard. Central nervous system: Alert and oriented. No focal neurological deficits. Extremities: Symmetric 5 x 5 power. Skin: No rashes, lesions or ulcers Psychiatry: Judgement and insight appear normal.  Very anxious   Data Reviewed:   CBC: Recent Labs  Lab 07/23/19 0452 07/23/19 1830 07/24/19 0626 07/25/19 0620  WBC 10.4 11.2* 12.2* 8.0  NEUTROABS 8.2* 8.9*  --   --   HGB 10.1* 9.7* 9.2* 9.1*  HCT 31.9* 31.1* 29.5* 28.1*  MCV 99.7 99.7 98.7 96.9  PLT 246 217 234 314   Basic Metabolic Panel: Recent Labs  Lab 07/23/19 0452 07/23/19 1830 07/24/19 0626 07/25/19 0620  NA 135 134* 138 136  K 4.1 4.1 4.5 4.2  CL 102 101 104 103  CO2 24 23 21* 21*  GLUCOSE 159* 114* 158* 207*  BUN 10 8 12 16   CREATININE 0.96 0.95 1.01* 0.83  CALCIUM 8.9 8.7* 8.7* 8.7*  MG  --  1.5* 1.8 2.0  PHOS  --  3.9  --   --    GFR: Estimated Creatinine Clearance: 79.9 mL/min (by C-G formula based on SCr of 0.83 mg/dL). Liver Function Tests: Recent Labs  Lab 07/23/19 0452  AST 17  ALT 10  ALKPHOS 112  BILITOT 0.5  PROT 6.6  ALBUMIN 2.8*   No results for input(s): LIPASE, AMYLASE in the last 168 hours. No results for input(s): AMMONIA in the last 168 hours. Coagulation Profile: Recent Labs  Lab 07/23/19 1830 07/24/19 0626 07/25/19 0620  INR 3.6* 4.4* 4.6*   Cardiac Enzymes: No results for input(s): CKTOTAL, CKMB, CKMBINDEX, TROPONINI in the last 168 hours. BNP (last 3 results) No results for input(s): PROBNP in the last 8760 hours. HbA1C: Recent Labs    07/23/19 1830  HGBA1C 6.6*   CBG: Recent Labs  Lab 07/24/19 0650 07/24/19 1135 07/24/19 1621 07/24/19 2056 07/25/19 0652  GLUCAP 131* 145* 200* 144* 173*   Lipid Profile: No results for input(s): CHOL, HDL, LDLCALC, TRIG, CHOLHDL, LDLDIRECT  in the last 72 hours. Thyroid Function Tests: No results for input(s): TSH, T4TOTAL, FREET4, T3FREE, THYROIDAB in the last 72 hours. Anemia Panel: No results for input(s): VITAMINB12, FOLATE, FERRITIN, TIBC, IRON, RETICCTPCT in the last 72 hours. Sepsis Labs: Recent Labs  Lab 07/23/19 0453 07/23/19 0608 07/23/19 1830  PROCALCITON  --   --  0.45  LATICACIDVEN 1.6 1.2  --     Recent Results (from the past 240 hour(s))  SARS CORONAVIRUS 2 (TAT 6-24 HRS) Nasopharyngeal Nasopharyngeal Swab     Status: None   Collection Time: 07/23/19  6:05 AM   Specimen: Nasopharyngeal Swab  Result Value Ref Range Status   SARS Coronavirus 2 NEGATIVE NEGATIVE Final    Comment: (NOTE) SARS-CoV-2 target nucleic acids are NOT DETECTED. The SARS-CoV-2 RNA is generally detectable in upper and lower respiratory specimens during the acute phase of infection. Negative results do not preclude SARS-CoV-2 infection, do not rule out co-infections with other pathogens, and should not be used as the sole basis for treatment or other patient management decisions. Negative results must be combined with clinical observations, patient history, and epidemiological information. The expected result is Negative.  Fact Sheet for Patients: SugarRoll.be Fact Sheet for Healthcare Providers: https://www.woods-mathews.com/ This test is not yet approved or cleared by the [place] FDA and  has been authorized for detection and/or diagnosis of SARS-CoV-2 by FDA under an Emergency Use Authorization (EUA). This EUA will remain  in effect (meaning this test can be used) for the duration of the COVID-19 declaration under Section 56 4(b)(1) of the Act, 21 U.S.C. section 360bbb-3(b)(1), unless the authorization is terminated or revoked sooner. Performed at [facility], [place] [address] [place], [place] [address]   Culture, blood (Routine X 2) w Reflex to ID Panel     Status: None  (Preliminary result)   Collection Time: 07/23/19  6:39 PM   Specimen: BLOOD  Result Value Ref Range Status   Specimen Description BLOOD RIGHT ARM  Final   Special Requests   Final    BOTTLES DRAWN AEROBIC ONLY Blood Culture results may not be optimal due to an inadequate volume of blood received in culture bottles   Culture   Final    NO GROWTH 2 DAYS Performed at [facility], [place] [address], [place], [place] [address]    Report Status PENDING  Incomplete  Culture, blood (Routine X 2) w Reflex to ID Panel     Status: None (Preliminary result)   Collection Time: 07/23/19  6:50 PM   Specimen: BLOOD  Result Value Ref Range Status   Specimen Description BLOOD RIGHT HAND  Final   Special Requests   Final    BOTTLES DRAWN AEROBIC ONLY Blood Culture adequate volume   Culture   Final    NO GROWTH 2 DAYS Performed at [facility], [place] [address] [place], [place] [address]    Report Status PENDING  Incomplete  SARS CORONAVIRUS 2 (TAT 6-24 HRS) Nasopharyngeal Nasopharyngeal Swab     Status: None   Collection Time: 07/24/19 11:30 AM   Specimen: Nasopharyngeal Swab  Result Value Ref Range Status   SARS Coronavirus 2 NEGATIVE NEGATIVE Final    Comment: (NOTE) SARS-CoV-2 target nucleic acids are NOT DETECTED. The SARS-CoV-2 RNA is generally detectable in upper and lower respiratory specimens during the acute phase of infection. Negative results do not preclude SARS-CoV-2 infection, do not rule out co-infections with other pathogens, and should not be used as the sole basis for treatment or other patient management decisions. Negative results must be combined with clinical observations, patient history, and epidemiological information. The expected result is Negative. Fact Sheet for Patients: SugarRoll.be Fact Sheet for Healthcare Providers: https://www.woods-mathews.com/ This test is not yet approved or cleared by the [place] FDA  and  has been authorized for detection and/or diagnosis of SARS-CoV-2 by FDA under an Emergency Use Authorization (EUA). This EUA will remain  in effect (meaning this test can be used) for the duration of the COVID-19 declaration under Section 56 4(b)(1) of the Act, 21 U.S.C. section 360bbb-3(b)(1), unless the authorization is terminated or revoked sooner. Performed at [facility], [place] [address], [place], [place] [address]          Radiology Studies: Dg Chest 2 View  Result Date: 07/25/2019 CLINICAL DATA:  Weakness, shortness of breath EXAM: CHEST - 2 VIEW COMPARISON:  07/23/2019 FINDINGS: Stable chronic interstitial prominence and areas of linear scarring. No pleural effusion or pneumothorax. Stable cardiomediastinal contours with evidence of prior CABG. Degenerative changes of the included spine. IMPRESSION: No acute process in the chest. Electronically Signed   By: Malachi Carl  Patel M.D.   On: 07/25/2019 08:28        Scheduled Meds: . azaTHIOprine  125 mg Oral QPM  . carbamazepine  200 mg Oral BID  . diltiazem  120 mg Oral Daily  . dofetilide  250 mcg Oral BID  . DULoxetine  60 mg Oral Daily  . fluticasone furoate-vilanterol  1 puff Inhalation Daily  . insulin aspart  0-20 Units Subcutaneous TID WC  . insulin glargine  6 Units Subcutaneous Daily  . ipratropium-albuterol  3 mL Nebulization TID  . metoprolol tartrate  25 mg Oral BID  . montelukast  10 mg Oral Daily  . nystatin  5 mL Oral QID  . pantoprazole  40 mg Oral Daily  . predniSONE  30 mg Oral Q breakfast  . rosuvastatin  20 mg Oral Daily  . Warfarin - Pharmacist Dosing Inpatient   Does not apply q1800   Continuous Infusions: . sodium chloride Stopped (07/24/19 1800)  . cefTRIAXone (ROCEPHIN)  IV Stopped (07/24/19 1830)  . doxycycline (VIBRAMYCIN) IV 125 mL/hr at 07/25/19 0834     LOS: 1 day   Time spent= 35 mins    Ankit Arsenio Loader, MD [facility]  If 7PM-7AM, please contact  night-coverage  07/25/2019, 8:43 AM

## 2019-07-25 NOTE — Progress Notes (Signed)
PT Cancellation Note  Patient Details Name: Katherine [name] MRN: [id number] DOB: [birth year]   Cancelled Treatment:    Reason Eval/Treat Not Completed: Pain limiting ability to participate. Pt continues to complain of a migraine, and declines participating with PT at this time. Discussed with RN, and does not appear that pt has been OOB since admission. Encouraged use of BSC instead of Purewick to at least get some activity as pt has been supine for most of the time. Will continue to follow and initiate PT evaluation when pt is agreeable.    Thelma Comp 07/25/2019, 3:01 PM   Rolinda Roan, PT, DPT Acute Rehabilitation Services Pager: [phone] Office: [phone]

## 2019-07-25 NOTE — Plan of Care (Signed)
  Problem: Health Behavior/Discharge Planning: Goal: Ability to manage health-related needs will improve Outcome: Progressing   Problem: Clinical Measurements: Goal: Respiratory complications will improve Outcome: Progressing   Problem: Activity: Goal: Risk for activity intolerance will decrease Outcome: Progressing   

## 2019-07-26 LAB — PROTIME-INR
INR: 3.2 — ABNORMAL HIGH (ref 0.8–1.2)
Prothrombin Time: 33 seconds — ABNORMAL HIGH (ref 11.4–15.2)

## 2019-07-26 LAB — CBC
HCT: 28.3 % — ABNORMAL LOW (ref 36.0–46.0)
Hemoglobin: 9 g/dL — ABNORMAL LOW (ref 12.0–15.0)
MCH: 31.3 pg (ref 26.0–34.0)
MCHC: 31.8 g/dL (ref 30.0–36.0)
MCV: 98.3 fL (ref 80.0–100.0)
Platelets: 276 10*3/uL (ref 150–400)
RBC: 2.88 MIL/uL — ABNORMAL LOW (ref 3.87–5.11)
RDW: 16.5 % — ABNORMAL HIGH (ref 11.5–15.5)
WBC: 6.6 10*3/uL (ref 4.0–10.5)
nRBC: 0 % (ref 0.0–0.2)

## 2019-07-26 LAB — GLUCOSE, CAPILLARY
Glucose-Capillary: 140 mg/dL — ABNORMAL HIGH (ref 70–99)
Glucose-Capillary: 233 mg/dL — ABNORMAL HIGH (ref 70–99)
Glucose-Capillary: 276 mg/dL — ABNORMAL HIGH (ref 70–99)
Glucose-Capillary: 312 mg/dL — ABNORMAL HIGH (ref 70–99)

## 2019-07-26 LAB — MAGNESIUM: Magnesium: 1.8 mg/dL (ref 1.7–2.4)

## 2019-07-26 LAB — BASIC METABOLIC PANEL
Anion gap: 12 (ref 5–15)
BUN: 16 mg/dL (ref 8–23)
CO2: 24 mmol/L (ref 22–32)
Calcium: 8.9 mg/dL (ref 8.9–10.3)
Chloride: 101 mmol/L (ref 98–111)
Creatinine, Ser: 0.95 mg/dL (ref 0.44–1.00)
GFR calc Af Amer: >60 mL/min (ref >60)
GFR calc non Af Amer: >60 mL/min (ref >60)
Glucose, Bld: 170 mg/dL — ABNORMAL HIGH (ref 70–99)
Potassium: 3.7 mmol/L (ref 3.5–5.1)
Sodium: 137 mmol/L (ref 135–145)

## 2019-07-26 LAB — EXPECTORATED SPUTUM ASSESSMENT W GRAM STAIN, RFLX TO RESP C

## 2019-07-26 MED ORDER — WARFARIN SODIUM 10 MG PO TABS
10.0000 mg | ORAL_TABLET | Freq: Once | ORAL | Status: AC
  Administered 2019-07-26: 10 mg via ORAL
  Filled 2019-07-26: qty 1

## 2019-07-26 MED ORDER — CALCITRIOL 0.25 MCG PO CAPS
0.2500 ug | ORAL_CAPSULE | ORAL | Status: DC
  Administered 2019-07-26: 0.25 ug via ORAL

## 2019-07-26 MED ORDER — FAMOTIDINE 20 MG PO TABS
20.0000 mg | ORAL_TABLET | Freq: Every day | ORAL | Status: DC
  Administered 2019-07-26 – 2019-07-28 (×3): 20 mg via ORAL
  Filled 2019-07-26 (×3): qty 1

## 2019-07-26 MED ORDER — DOXYCYCLINE HYCLATE 100 MG PO TABS
100.0000 mg | ORAL_TABLET | Freq: Two times a day (BID) | ORAL | Status: DC
  Administered 2019-07-26 – 2019-07-28 (×5): 100 mg via ORAL
  Filled 2019-07-26 (×5): qty 1

## 2019-07-26 MED ORDER — CYCLOBENZAPRINE HCL 10 MG PO TABS
10.0000 mg | ORAL_TABLET | Freq: Two times a day (BID) | ORAL | Status: DC
  Administered 2019-07-26 – 2019-07-28 (×5): 10 mg via ORAL
  Filled 2019-07-26 (×5): qty 1

## 2019-07-26 MED ORDER — MAGNESIUM SULFATE 2 GM/50ML IV SOLN
2.0000 g | Freq: Once | INTRAVENOUS | Status: AC
  Administered 2019-07-26: 2 g via INTRAVENOUS
  Filled 2019-07-26: qty 50

## 2019-07-26 MED ORDER — FUROSEMIDE 10 MG/ML IJ SOLN
40.0000 mg | Freq: Once | INTRAMUSCULAR | Status: AC
  Administered 2019-07-26: 40 mg via INTRAVENOUS
  Filled 2019-07-26: qty 4

## 2019-07-26 MED ORDER — SERTRALINE HCL 100 MG PO TABS
200.0000 mg | ORAL_TABLET | Freq: Every day | ORAL | Status: DC
  Administered 2019-07-26 – 2019-07-28 (×3): 200 mg via ORAL
  Filled 2019-07-26 (×3): qty 2

## 2019-07-26 MED ORDER — POTASSIUM CHLORIDE CRYS ER 20 MEQ PO TBCR
40.0000 meq | EXTENDED_RELEASE_TABLET | Freq: Once | ORAL | Status: AC
  Administered 2019-07-26: 40 meq via ORAL
  Filled 2019-07-26: qty 2

## 2019-07-26 MED ORDER — AMOXICILLIN-POT CLAVULANATE 875-125 MG PO TABS
1.0000 | ORAL_TABLET | Freq: Two times a day (BID) | ORAL | Status: DC
  Administered 2019-07-26 – 2019-07-28 (×5): 1 via ORAL
  Filled 2019-07-26 (×5): qty 1

## 2019-07-26 MED ORDER — LORAZEPAM 1 MG PO TABS
1.0000 mg | ORAL_TABLET | Freq: Every day | ORAL | Status: DC | PRN
  Administered 2019-07-26 – 2019-07-28 (×3): 1 mg via ORAL
  Filled 2019-07-26 (×3): qty 1

## 2019-07-26 NOTE — Evaluation (Signed)
Physical Therapy Evaluation Patient Details Name: Katherine [name] MRN: [id number] DOB: 12-22-1955 Today's Date: 07/26/2019   History of Present Illness  Pt is a 63 y/o M admitted on 07/23/19 for flu-like illness for past 3 days with weakness & SOB. Pt found to have acute hypoxic respiratory failure 2/2 asthma exacerbation complicated by restrictive lung disease. PMH significant for DM2, HTN, HLD, RA, CAD s/p CABG, ESRD s/p renal transplant on chronic immunosuppression, restrictive lung disease 2/2 obesity, anxiety, fibromyalgia, stroke, & unstable angina.  Clinical Impression  Pt currently requires supervision overall for mobility with use of RW. Gait distances are limited by pt fatigue but SpO2 levels remained >90% on 2L/min supplemental oxygen. Pt would benefit from ongoing acute PT services to address gait & balance & progress mobility with LRAD. Pt would benefit from HHPT f/u at d/c.     Follow Up Recommendations Home health PT;Supervision for mobility/OOB    Equipment Recommendations  Rolling walker with 5" wheels    Recommendations for Other Services       Precautions / Restrictions Precautions Precautions: Fall Restrictions Weight Bearing Restrictions: No      Mobility  Bed Mobility Overal bed mobility: Needs Assistance Bed Mobility: Supine to Sit     Supine to sit: Supervision;HOB elevated(bed rails)        Transfers Overall transfer level: Needs assistance Equipment used: Rolling walker (2 wheeled) Transfers: Sit to/from Stand Sit to Stand: Supervision         General transfer comment: cuing but poor demo for proper hand placement  Ambulation/Gait Ambulation/Gait assistance: Supervision Gait Distance (Feet): 40 Feet Assistive device: Rolling walker (2 wheeled) Gait Pattern/deviations: Decreased stride length Gait velocity: decreased      Stairs            Wheelchair Mobility    Modified Rankin (Stroke Patients Only)       Balance  Overall balance assessment: Needs assistance Sitting-balance support: Feet supported Sitting balance-Leahy Scale: Fair Sitting balance - Comments: supervision for static sitting balance EOB   Standing balance support: Bilateral upper extremity supported;During functional activity Standing balance-Leahy Scale: Poor Standing balance comment: supervision standing with BUE support on RW                             Pertinent Vitals/Pain Pain Assessment: (pt with ice pack on head reporting HA upon questioning but does not rate it & with no other c/o pain during session)    Home Living Family/patient expects to be discharged to:: Private residence Living Arrangements: (roommate) Available Help at Discharge: Family;Available PRN/intermittently;Available 24 hours/day Type of Home: Mobile home Home Access: Stairs to enter Entrance Stairs-Rails: Left;Right(pt reports rails are not in good condition) Entrance Stairs-Number of Steps: 5 Home Layout: One level Home Equipment: (rollator)      Prior Function Level of Independence: Independent;Independent with assistive device(s)         Comments: used rollator in [employer]        Extremity/Trunk Assessment   Upper Extremity Assessment Upper Extremity Assessment: Overall WFL for tasks assessed    Lower Extremity Assessment Lower Extremity Assessment: Overall WFL for tasks assessed       Communication   Communication: No difficulties  Cognition Arousal/Alertness: Awake/alert Behavior During Therapy: WFL for tasks assessed/performed Overall Cognitive Status: Within Functional Limits for tasks assessed  General Comments: pt perseverative on not receiving medications - MD & RN aware      General Comments General comments (skin integrity, edema, etc.): pt on 2L/min supplemental oxygen via nasal cannula during session, SpO2 = 96% at beginning of session,  at end of session SpO2 = 93% HR = 93 bpm    Exercises     Assessment/Plan    PT Assessment Patient needs continued PT services  PT Problem List Decreased strength;Decreased balance;Pain;Cardiopulmonary status limiting activity;Obesity;Decreased knowledge of use of DME;Decreased mobility;Decreased activity tolerance;Decreased coordination       PT Treatment Interventions DME instruction;Functional mobility training;Balance training;Patient/family education;Gait training;Therapeutic activities;Neuromuscular re-education;Stair training;Therapeutic exercise    PT Goals (Current goals can be found in the Care Plan section)  Acute Rehab PT Goals Patient Stated Goal: to get her medicine PT Goal Formulation: With patient Time For Goal Achievement: 07/26/19 Potential to Achieve Goals: Good    Frequency Min 3X/week   Barriers to discharge Decreased caregiver support pt lives with roommate & has neice that can provide supervision for majority of afternoon    Co-evaluation               AM-PAC PT "6 Clicks" Mobility  Outcome Measure Help needed turning from your back to your side while in a flat bed without using bedrails?: A Little Help needed moving from lying on your back to sitting on the side of a flat bed without using bedrails?: A Little Help needed moving to and from a bed to a chair (including a wheelchair)?: A Little Help needed standing up from a chair using your arms (e.g., wheelchair or bedside chair)?: A Little Help needed to walk in hospital room?: A Little Help needed climbing 3-5 steps with a railing? : A Lot 6 Click Score: 17    End of Session Equipment Utilized During Treatment: Gait belt;Oxygen Activity Tolerance: Patient tolerated treatment well;Patient limited by fatigue Patient left: in chair;with chair alarm set;with call bell/phone within reach Nurse Communication: Mobility status PT Visit Diagnosis: Muscle weakness (generalized) (M62.81);Difficulty in  walking, not elsewhere classified (R26.2)    Time: 8546-2703 PT Time Calculation (min) (ACUTE ONLY): 22 min   Charges:   PT Evaluation $PT Eval Moderate Complexity: [place], PT, DPT 07/26/2019, 1:08 PM

## 2019-07-26 NOTE — Progress Notes (Signed)
Patient's QTC 539,pt has scheduled tikosyn tonight. [place],NP text paged. Order to hold [facility]. Will continue to monitor. Gerren Hoffmeier, Wonda Cheng, Therapist, sports

## 2019-07-26 NOTE — Progress Notes (Signed)
ANTICOAGULATION CONSULT NOTE - Initial Consult  Pharmacy Consult for warfarin Indication: afib  Allergies  Allergen Reactions  . Tetracycline Hives  . Hydromorphone Hcl Nausea And Vomiting  . Niacin Other (See Comments)    Mouth blisters  . Niaspan [Niacin Er] Other (See Comments)    Mouth blisters  . Sulfa Antibiotics Nausea Only and Other (See Comments)    "Tears up stomach"  . Sulfonamide Derivatives Other (See Comments)    Reaction: per patient "tears her stomach up"  . Codeine Nausea And Vomiting  . Erythromycin Nausea And Vomiting  . Hydromorphone Hcl Nausea And Vomiting  . Morphine And Related Nausea And Vomiting  . Nalbuphine Nausea And Vomiting  . Sulfasalazine Nausea Only and Other (See Comments)    per patient "tears her stomach up", "Tears up stomach"  . Tape Rash and Other (See Comments)    No "plastic" tape," please    Patient Measurements: Height: 5\' 1"  (154.9 cm) Weight: 235 lb 3.7 oz (106.7 kg) IBW/kg (Calculated) : 47.8   Vital Signs: Temp: 98.9 F (37.2 C) (12/09 0517) Temp Source: Oral (12/09 0517) BP: 163/79 (12/09 0517) Pulse Rate: 78 (12/09 0517)  Labs: Recent Labs    07/23/19 1830 07/23/19 2229 07/24/19 0626 07/25/19 0620 07/26/19 0213  HGB 9.7*  --  9.2* 9.1* 9.0*  HCT 31.1*  --  29.5* 28.1* 28.3*  PLT 217  --  234 232 276  LABPROT 36.2*  --  41.7* 43.2* 33.0*  INR 3.6*  --  4.4* 4.6* 3.2*  CREATININE 0.95  --  1.01* 0.83 0.95  TROPONINIHS 28* 30*  --   --   --     Estimated Creatinine Clearance: 69.2 mL/min (by C-G formula based on SCr of 0.95 mg/dL).   Medical History: Past Medical History:  Diagnosis Date  . Anemia   . Anxiety   . Bilateral carotid artery stenosis    Carotid duplex 10/5359: 4-43% LICA, 15-40% RICA, >08% RECA, f/u 1 yr suggested  . CAD (coronary artery disease) of bypass graft 5/01; 3/'02, 8/'03, 10/'04; 1/15   PCI x 5 to SVG-D1   . CAD in native artery 07/1993    3 Vessel Disease (LAD-D1 & RCA) -- CABG   . CAD S/P percutaneous coronary angioplasty    PCI to SVG-D1 insertion/native D1 x 4 = '01 -(S660 BMS 2.5 x 9 - insertion into D1; '02 - distal overlap ACS Pixel 2.5 x 8  BMS; '03 distal/native ISR/Thrombosis - Pixel 2.5 x 13; '04 - ISR-  Taxus 2.5 x 20 (covered all);; 1/15 - mid SVG-D1 (50% distal ISR) - Promus P 2.75 x 20 -- 2.8 mm  . COPD mixed type ([place])    Followed by Dr. Lamonte Sakai "pulmonologist said no COPD"  . Depression   . Depression with anxiety   . Diabetes mellitus type 2 in obese ([place])   . Diarrhea    started after cholecystectomy and mass removed from intestine  . Dyslipidemia, goal LDL below 70    08/2012: TC 137, TG 200, HDL 32!, LDL 45; on statin (followed by Dr.Deterding)  . ESRD (end stage renal disease) ([place]) 1991   s/p Cadaveric Renal Transplant [facility] - Dr. Jimmy Footman)   . Family history of adverse reaction to anesthesia    mom's bp dropped during/after anesthesia  . Fibromyalgia   . GERD (gastroesophageal reflux disease)   . Glomerulonephritis, chronic, rapidly progressive 47  . 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 07/1993  Rescue PTCA of RCA -- referred for CABG.  . H/O: GI bleed   . Headache    migraines in the past  . History of kidney stones   . Hypertension associated with diabetes ([place])   . Morbid obesity ([place])   . MRSA (methicillin resistant staph aureus) culture positive   . OSA (obstructive sleep apnea)    no longer on CPAP or home O2, states she doesn't need now after lap band  . PAD (peripheral artery disease) ([place]) 08/2013   LEA Dopplers to be read by Dr. Fletcher Anon  . PAF (paroxysmal atrial fibrillation) ([place]) 06/2014   Noted on CardioNet Monitor  - rate ~112; no recurrent symptoms. Nonionic regulation because of frequent GI procedures. Patient prefers Plavix  . Pneumonia   . Recurrent boils    Bilateral Groin  . Rheumatoid arthritis ([place])    Per Patient Report; associated with OA  . 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  Servando Snare: LIMA-LAD, SVG-bifurcatingD1, SVG-rPDA  . S/p cadaver renal transplant [place]  . Stroke [facility]) 2012   "right eye stroke- half blind now"  . Unstable angina ([place]) 5/01; 3/'02, 8/'03, 10/'04; 1/15   x 5 occurences since Inf-Post STEMI in 1994   Assessment: 63 yo female with h/o afib on warfarin. PTA warfarin dose 10mg  daily except 5mg  on Sun on which the patients previous INR was stable at 2. Last dose PTA 12/5. INR on admit 3.6. Warfarin 10mg  given 12/6. INR 4.6> 3.2 today. Will give 10mg  to prevent too rapid a drop in INR.  Goal of Therapy:  INR 2-3 Monitor platelets by anticoagulation protocol: Yes   Plan:  Warfarin 10 mg PO x 1 Daily INR Monitor for bleeding  Tammela Bales A. Levada Dy, PharmD, BCPS, FNKF Clinical Pharmacist [place] Please utilize Amion for appropriate phone number to reach the unit pharmacist ([place])   07/26/2019,7:54 AM

## 2019-07-26 NOTE — Progress Notes (Signed)
PROGRESS NOTE    Katherine [name]  XKG:[id number] DOB: [birth year] DOA: 07/23/2019 PCP: [name], MD   Brief Narrative:  63 year old with history of DM2, HTN, HLD, rheumatoid arthritis, CAD status post CABG, ESRD status post renal transplant on chronic immunosuppression-Imuran/prednisone presented with URI symptoms and weakness.  COVID-19 was tested negative.  Chest x-ray did not show any active lung disease.   Assessment & Plan:   Principal Problem:   Acute respiratory failure with hypoxia (HCC) Active Problems:   Extrinsic asthma   Morbid obesity - s/p Lap Band 9/'05   Hypertension associated with diabetes ([place])   S/P kidney transplant   Long term (current) use of anticoagulants   Asthma exacerbation   ESRD (end stage renal disease) (HCC)   OSA (obstructive sleep apnea)   Current chronic use of systemic steroids  Acute respiratory distress with mild hypoxia, 2 L nasal cannula.  New requirement -Still requiring 2 L nasal cannula, not on any home oxygen.  This is a new requirement.  COVID-19-X2-negative, influenza-negative -Transition patient from Rocephin/doxycycline to oral amoxicillin and oral doxycycline.  Complete 7-day course -Bronchodilators, incentive spirometer and flutter valve.  1 more dose of IV Lasix 40 mg -Follow-up sputum culture -On p.o. prednisone -Procalcitonin 0.45.  WBC trending down. -Echocardiogram 55 to 70%, chronic diastolic congestive heart failure grade 2  Atrial fibrillation, chronic Grade 2 diastolic dysfunction, ejection fraction 55 to 60% -On Tikosyn, metoprolol and Cardizem. -Coumadin.  INR still slightly up, pharmacy to manage -Crestor 20 mg daily.  Metoprolol 25 mg p.o. twice daily  Chronic pain -On oxycodone at home, continue to closely monitor her mentation  Insulin-dependent diabetes mellitus type 2 -Continue Lantus.  Insulin sliding scale and Accu-Chek  History of ESRD now with renal transplant Chronic immunosuppression -On  Imuran and prednisone.  Discussed by previous provider with nephrology who advised to continue to monitor this  GERD -PPI  Generalized weakness -PT/OT-home health  History of anxiety/bipolar -Requested pharmacy to confirm her home dose of lorazepam and Zoloft  DVT prophylaxis: Coumadin Code Status: Full Family Communication: None at bedside Disposition Plan: Still requiring 2 L nasal cannula, on room air at home.  Continue hospital stay until we are able to wean her off oxygen    Subjective: Feeling very anxious, requesting her home Zoloft and lorazepam.  I have advised her that we will have pharmacy confirm this before we administer this medication.  Otherwise still reports of stable exertional shortness of breath.  Review of Systems Otherwise negative except as per HPI, including: General = no fevers, chills, dizziness, malaise, fatigue HEENT/EYES = negative for pain, redness, loss of vision, double vision, blurred vision, loss of hearing, sore throat, hoarseness, dysphagia Cardiovascular= negative for chest pain, palpitation, murmurs, lower extremity swelling Respiratory/lungs= negative for cough, hemoptysis, wheezing, mucus production Gastrointestinal= negative for nausea, vomiting,, abdominal pain, melena, hematemesis Genitourinary= negative for Dysuria, Hematuria, Change in Urinary Frequency MSK = Negative for arthralgia, myalgias, Back Pain, Joint swelling  Neurology= Negative for headache, seizures, numbness, tingling  Psychiatry= Negative for anxiety, depression, suicidal and homocidal ideation Allergy/Immunology= Medication/Food allergy as listed  Skin= Negative for Rash, lesions, ulcers, itching   Objective: Vitals:   07/26/19 0517 07/26/19 0759 07/26/19 0801 07/26/19 0835  BP: (!) 163/79   (!) 165/72  Pulse: 78   88  Resp: 18   18  Temp: 98.9 F (37.2 C)   98.4 F (36.9 C)  TempSrc: Oral   Oral  SpO2: 94% 95% 95% 93%  Weight: 106.7 kg     Height:         Intake/Output Summary (Last 24 hours) at 07/26/2019 1032 Last data filed at 07/26/2019 0559 Gross per 24 hour  Intake 980 ml  Output 2500 ml  Net -1520 ml   Filed Weights   07/23/19 1820 07/25/19 0352 07/26/19 0517  Weight: 108.8 kg 108.2 kg 106.7 kg    Examination:  Constitutional: Not in acute distress, 2 L nasal cannula Respiratory: Minimal bilateral expiratory wheezing especially at the bases Cardiovascular: Normal sinus rhythm, no rubs Abdomen: Nontender nondistended good bowel sounds Musculoskeletal: No edema noted Skin: No rashes seen Neurologic: CN 2-12 grossly intact.  And nonfocal Psychiatric: Normal judgment and insight. Alert and oriented x 3.  Very anxious   Data Reviewed:   CBC: Recent Labs  Lab 07/23/19 0452 07/23/19 1830 07/24/19 0626 07/25/19 0620 07/26/19 0213  WBC 10.4 11.2* 12.2* 8.0 6.6  NEUTROABS 8.2* 8.9*  --   --   --   HGB 10.1* 9.7* 9.2* 9.1* 9.0*  HCT 31.9* 31.1* 29.5* 28.1* 28.3*  MCV 99.7 99.7 98.7 96.9 98.3  PLT 246 217 234 232 250   Basic Metabolic Panel: Recent Labs  Lab 07/23/19 0452 07/23/19 1830 07/24/19 0626 07/25/19 0620 07/26/19 0213  NA 135 134* 138 136 137  K 4.1 4.1 4.5 4.2 3.7  CL 102 101 104 103 101  CO2 24 23 21* 21* 24  GLUCOSE 159* 114* 158* 207* 170*  BUN 10 8 12 16 16   CREATININE 0.96 0.95 1.01* 0.83 0.95  CALCIUM 8.9 8.7* 8.7* 8.7* 8.9  MG  --  1.5* 1.8 2.0 1.8  PHOS  --  3.9  --   --   --    GFR: Estimated Creatinine Clearance: 69.2 mL/min (by C-G formula based on SCr of 0.95 mg/dL). Liver Function Tests: Recent Labs  Lab 07/23/19 0452  AST 17  ALT 10  ALKPHOS 112  BILITOT 0.5  PROT 6.6  ALBUMIN 2.8*   No results for input(s): LIPASE, AMYLASE in the last 168 hours. No results for input(s): AMMONIA in the last 168 hours. Coagulation Profile: Recent Labs  Lab 07/23/19 1830 07/24/19 0626 07/25/19 0620 07/26/19 0213  INR 3.6* 4.4* 4.6* 3.2*   Cardiac Enzymes: No results for input(s):  CKTOTAL, CKMB, CKMBINDEX, TROPONINI in the last 168 hours. BNP (last 3 results) No results for input(s): PROBNP in the last 8760 hours. HbA1C: Recent Labs    07/23/19 1830  HGBA1C 6.6*   CBG: Recent Labs  Lab 07/25/19 0652 07/25/19 1132 07/25/19 1629 07/25/19 2050 07/26/19 0652  GLUCAP 173* 180* 285* 257* 140*   Lipid Profile: No results for input(s): CHOL, HDL, LDLCALC, TRIG, CHOLHDL, LDLDIRECT in the last 72 hours. Thyroid Function Tests: No results for input(s): TSH, T4TOTAL, FREET4, T3FREE, THYROIDAB in the last 72 hours. Anemia Panel: No results for input(s): VITAMINB12, FOLATE, FERRITIN, TIBC, IRON, RETICCTPCT in the last 72 hours. Sepsis Labs: Recent Labs  Lab 07/23/19 0453 07/23/19 0608 07/23/19 1830  PROCALCITON  --   --  0.45  LATICACIDVEN 1.6 1.2  --     Recent Results (from the past 240 hour(s))  SARS CORONAVIRUS 2 (TAT 6-24 HRS) Nasopharyngeal Nasopharyngeal Swab     Status: None   Collection Time: 07/23/19  6:05 AM   Specimen: Nasopharyngeal Swab  Result Value Ref Range Status   SARS Coronavirus 2 NEGATIVE NEGATIVE Final    Comment: (NOTE) SARS-CoV-2 target nucleic acids are NOT DETECTED. The SARS-CoV-2  RNA is generally detectable in upper and lower respiratory specimens during the acute phase of infection. Negative results do not preclude SARS-CoV-2 infection, do not rule out co-infections with other pathogens, and should not be used as the sole basis for treatment or other patient management decisions. Negative results must be combined with clinical observations, patient history, and epidemiological information. The expected result is Negative. Fact Sheet for Patients: SugarRoll.be Fact Sheet for Healthcare Providers: https://www.woods-mathews.com/ This test is not yet approved or cleared by the [place] FDA and  has been authorized for detection and/or diagnosis of SARS-CoV-2 by FDA under an  Emergency Use Authorization (EUA). This EUA will remain  in effect (meaning this test can be used) for the duration of the COVID-19 declaration under Section 56 4(b)(1) of the Act, 21 U.S.C. section 360bbb-3(b)(1), unless the authorization is terminated or revoked sooner. Performed at [facility], [place] [address], [place], [place] [address]   Culture, blood (Routine X 2) w Reflex to ID Panel     Status: None (Preliminary result)   Collection Time: 07/23/19  6:39 PM   Specimen: BLOOD  Result Value Ref Range Status   Specimen Description BLOOD RIGHT ARM  Final   Special Requests   Final    BOTTLES DRAWN AEROBIC ONLY Blood Culture results may not be optimal due to an inadequate volume of blood received in culture bottles   Culture   Final    NO GROWTH 2 DAYS Performed at [facility], [place] [address] [place], [place] [address]    Report Status PENDING  Incomplete  Culture, blood (Routine X 2) w Reflex to ID Panel     Status: None (Preliminary result)   Collection Time: 07/23/19  6:50 PM   Specimen: BLOOD  Result Value Ref Range Status   Specimen Description BLOOD RIGHT HAND  Final   Special Requests   Final    BOTTLES DRAWN AEROBIC ONLY Blood Culture adequate volume   Culture   Final    NO GROWTH 2 DAYS Performed at [facility], [place] [address] [place], [place] [address]    Report Status PENDING  Incomplete  SARS CORONAVIRUS 2 (TAT 6-24 HRS) Nasopharyngeal Nasopharyngeal Swab     Status: None   Collection Time: 07/24/19 11:30 AM   Specimen: Nasopharyngeal Swab  Result Value Ref Range Status   SARS Coronavirus 2 NEGATIVE NEGATIVE Final    Comment: (NOTE) SARS-CoV-2 target nucleic acids are NOT DETECTED. The SARS-CoV-2 RNA is generally detectable in upper and lower respiratory specimens during the acute phase of infection. Negative results do not preclude SARS-CoV-2 infection, do not rule out co-infections with other pathogens, and should not be used as the  sole basis for treatment or other patient management decisions. Negative results must be combined with clinical observations, patient history, and epidemiological information. The expected result is Negative. Fact Sheet for Patients: SugarRoll.be Fact Sheet for Healthcare Providers: https://www.woods-mathews.com/ This test is not yet approved or cleared by the [place] FDA and  has been authorized for detection and/or diagnosis of SARS-CoV-2 by FDA under an Emergency Use Authorization (EUA). This EUA will remain  in effect (meaning this test can be used) for the duration of the COVID-19 declaration under Section 56 4(b)(1) of the Act, 21 U.S.C. section 360bbb-3(b)(1), unless the authorization is terminated or revoked sooner. Performed at [facility], [place] [address], [place], [place] [address]   Expectorated sputum assessment w rflx to resp cult  Status: None (Preliminary result)   Collection Time: 07/25/19  9:53 AM   Specimen: Sputum  Result Value Ref Range Status   Specimen Description SPUTUM  Final   Special Requests Immunocompromised  Final   Sputum evaluation   Final    THIS SPECIMEN IS ACCEPTABLE FOR SPUTUM CULTURE Performed at [facility], [address] [place], [place] [address]    Report Status PENDING  Incomplete  Culture, respiratory     Status: None (Preliminary result)   Collection Time: 07/25/19  9:53 AM   Specimen: SPU  Result Value Ref Range Status   Specimen Description SPUTUM  Final   Special Requests Immunocompromised Reflexed from [id number]  Final   Gram Stain   Final    ABUNDANT WBC PRESENT,BOTH PMN AND MONONUCLEAR RARE GRAM POSITIVE COCCI FEW GRAM VARIABLE ROD RARE YEAST    Culture   Final    CULTURE REINCUBATED FOR BETTER GROWTH Performed at [facility], [place] [address] [place], [place] [address]    Report Status PENDING  Incomplete         Radiology Studies: Dg Chest 2  View  Result Date: 07/25/2019 CLINICAL DATA:  Weakness, shortness of breath EXAM: CHEST - 2 VIEW COMPARISON:  07/23/2019 FINDINGS: Stable chronic interstitial prominence and areas of linear scarring. No pleural effusion or pneumothorax. Stable cardiomediastinal contours with evidence of prior CABG. Degenerative changes of the included spine. IMPRESSION: No acute process in the chest. Electronically Signed   By: Macy Mis M.D.   On: 07/25/2019 08:28        Scheduled Meds: . amoxicillin-clavulanate  1 tablet Oral Q12H  . azaTHIOprine  125 mg Oral QPM  . calcitRIOL  0.25 mcg Oral Q3 days  . carbamazepine  200 mg Oral BID  . cyclobenzaprine  10 mg Oral BID  . diltiazem  120 mg Oral Daily  . dofetilide  250 mcg Oral BID  . doxycycline  100 mg Oral Q12H  . DULoxetine  60 mg Oral Daily  . famotidine  20 mg Oral Daily  . fluticasone furoate-vilanterol  1 puff Inhalation Daily  . insulin aspart  0-20 Units Subcutaneous TID WC  . insulin glargine  6 Units Subcutaneous Daily  . ipratropium-albuterol  3 mL Nebulization BID  . metoprolol tartrate  25 mg Oral BID  . montelukast  10 mg Oral Daily  . nystatin  5 mL Oral QID  . pantoprazole  40 mg Oral Daily  . predniSONE  30 mg Oral Q breakfast  . rosuvastatin  20 mg Oral Daily  . warfarin  10 mg Oral ONCE-1800  . Warfarin - Pharmacist Dosing Inpatient   Does not apply q1800   Continuous Infusions: . sodium chloride Stopped (07/24/19 1800)  . magnesium sulfate bolus IVPB       LOS: 2 days   Time spent= 35 mins     Arsenio Loader, MD [facility]  If 7PM-7AM, please contact night-coverage  07/26/2019, 10:32 AM

## 2019-07-27 LAB — CBC
HCT: 31.4 % — ABNORMAL LOW (ref 36.0–46.0)
Hemoglobin: 9.8 g/dL — ABNORMAL LOW (ref 12.0–15.0)
MCH: 30.4 pg (ref 26.0–34.0)
MCHC: 31.2 g/dL (ref 30.0–36.0)
MCV: 97.5 fL (ref 80.0–100.0)
Platelets: 324 10*3/uL (ref 150–400)
RBC: 3.22 MIL/uL — ABNORMAL LOW (ref 3.87–5.11)
RDW: 16.3 % — ABNORMAL HIGH (ref 11.5–15.5)
WBC: 6.8 10*3/uL (ref 4.0–10.5)
nRBC: 0 % (ref 0.0–0.2)

## 2019-07-27 LAB — GLUCOSE, CAPILLARY
Glucose-Capillary: 110 mg/dL — ABNORMAL HIGH (ref 70–99)
Glucose-Capillary: 338 mg/dL — ABNORMAL HIGH (ref 70–99)
Glucose-Capillary: 193 mg/dL — ABNORMAL HIGH (ref 70–99)
Glucose-Capillary: 128 mg/dL — ABNORMAL HIGH (ref 70–99)

## 2019-07-27 LAB — CULTURE, RESPIRATORY W GRAM STAIN: Culture: NORMAL

## 2019-07-27 LAB — BASIC METABOLIC PANEL
Anion gap: 14 (ref 5–15)
BUN: 22 mg/dL (ref 8–23)
CO2: 25 mmol/L (ref 22–32)
Calcium: 9.2 mg/dL (ref 8.9–10.3)
Chloride: 98 mmol/L (ref 98–111)
Creatinine, Ser: 1.03 mg/dL — ABNORMAL HIGH (ref 0.44–1.00)
GFR calc Af Amer: >60 mL/min (ref >60)
GFR calc non Af Amer: 58 mL/min — ABNORMAL LOW (ref >60)
Glucose, Bld: 128 mg/dL — ABNORMAL HIGH (ref 70–99)
Potassium: 4.6 mmol/L (ref 3.5–5.1)
Sodium: 137 mmol/L (ref 135–145)

## 2019-07-27 LAB — PROTIME-INR
INR: 2.5 — ABNORMAL HIGH (ref 0.8–1.2)
Prothrombin Time: 27.3 seconds — ABNORMAL HIGH (ref 11.4–15.2)

## 2019-07-27 LAB — MAGNESIUM: Magnesium: 2 mg/dL (ref 1.7–2.4)

## 2019-07-27 MED ORDER — AMOXICILLIN-POT CLAVULANATE 875-125 MG PO TABS: 1.0000 | ORAL_TABLET | Freq: Two times a day (BID) | ORAL | 0 refills | Status: AC

## 2019-07-27 MED ORDER — FUROSEMIDE 10 MG/ML IJ SOLN
40.0000 mg | Freq: Two times a day (BID) | INTRAMUSCULAR | Status: AC
  Administered 2019-07-27 (×2): 40 mg via INTRAVENOUS
  Filled 2019-07-27 (×2): qty 4

## 2019-07-27 MED ORDER — INSULIN ASPART 100 UNIT/ML ~~LOC~~ SOLN
4.0000 [IU] | Freq: Three times a day (TID) | SUBCUTANEOUS | Status: DC
  Administered 2019-07-27 – 2019-07-28 (×4): 4 [IU] via SUBCUTANEOUS

## 2019-07-27 MED ORDER — WARFARIN SODIUM 10 MG PO TABS
10.0000 mg | ORAL_TABLET | Freq: Once | ORAL | Status: AC
  Administered 2019-07-27: 10 mg via ORAL
  Filled 2019-07-27: qty 1

## 2019-07-27 MED ORDER — GUAIFENESIN ER 600 MG PO TB12
1200.0000 mg | ORAL_TABLET | Freq: Two times a day (BID) | ORAL | Status: DC
  Administered 2019-07-27 – 2019-07-28 (×3): 1200 mg via ORAL
  Filled 2019-07-27 (×3): qty 2

## 2019-07-27 MED ORDER — DOXYCYCLINE HYCLATE 100 MG PO TABS: 100.0000 mg | ORAL_TABLET | Freq: Two times a day (BID) | ORAL | 0 refills | Status: AC

## 2019-07-27 MED ORDER — GUAIFENESIN-DM 100-10 MG/5ML PO SYRP
5.0000 mL | ORAL_SOLUTION | ORAL | Status: DC | PRN
  Administered 2019-07-27 (×2): 5 mL via ORAL
  Filled 2019-07-27 (×3): qty 5

## 2019-07-27 NOTE — Progress Notes (Signed)
On Room Air sitting on bed patient was 92%.  Patient stood up to walk and o2 sat dropped to 90%.  Walked patient to door and dropped to 80%. Had her turn and Walk back towards bed and dropped to 75%.  Got patient to bed and put oxygen back on 1L.  Patient got back to 90%.  Will continue to monitor

## 2019-07-27 NOTE — Progress Notes (Signed)
Physical Therapy Treatment Patient Details Name: Katherine [name] MRN: [id number] DOB: 12-May-1956 Today's Date: 07/27/2019    History of Present Illness Pt is a 62 y/o M admitted on 07/23/19 for flu-like illness for past 3 days with weakness & SOB. Pt found to have acute hypoxic respiratory failure 2/2 asthma exacerbation complicated by restrictive lung disease. PMH significant for DM2, HTN, HLD, RA, CAD s/p CABG, ESRD s/p renal transplant on chronic immunosuppression, restrictive lung disease 2/2 obesity, anxiety, fibromyalgia, stroke, & unstable angina.    PT Comments    Pt with improved ambulation distance this session, requires use of RW for steadying and standing rest breaks x4 to recover dyspnea on exertion 2/4 with verbal cuing for breathing technique. Pt with sats maintained 92% and above on RA during last 40 ft ambulation, earlier in the day pt desatted with RN to 70s%. PT to continue to follow acutely, will progress mobility as able.    Follow Up Recommendations  Home health PT;Supervision for mobility/OOB     Equipment Recommendations  Rolling walker with 5" wheels    Recommendations for Other Services       Precautions / Restrictions Precautions Precautions: Fall Restrictions Weight Bearing Restrictions: No    Mobility  Bed Mobility Overal bed mobility: Needs Assistance       Supine to sit: (bed rails)     General bed mobility comments: pt up in chair upon PT arrival to room, requesting stay in chair upon PT exit  Transfers Overall transfer level: Needs assistance Equipment used: Rolling walker (2 wheeled) Transfers: Sit to/from Stand Sit to Stand: Supervision         General transfer comment: supervision for safety, verbal cuing for proper hand placement when rising/sitting  Ambulation/Gait Ambulation/Gait assistance: Supervision;Min guard Gait Distance (Feet): 120 Feet Assistive device: Rolling walker (2 wheeled) Gait Pattern/deviations:  Decreased stride length;Step-through pattern;Trunk flexed Gait velocity: decreased   General Gait Details: Min guard to supervision for safety, standing rest breaks x4 during ambulation. Sats 93-96% on 1LO2, taken off O2 for ~40 ft with sats maintained 92% and greater   Stairs             Wheelchair Mobility    Modified Rankin (Stroke Patients Only)       Balance Overall balance assessment: Needs assistance Sitting-balance support: Feet supported Sitting balance-Leahy Scale: Good Sitting balance - Comments: supervision for static sitting balance edge of recliner   Standing balance support: Bilateral upper extremity supported;During functional activity Standing balance-Leahy Scale: Poor Standing balance comment: supervision standing with BUE support on RW                            Cognition Arousal/Alertness: Awake/alert Behavior During Therapy: WFL for tasks assessed/performed Overall Cognitive Status: Within Functional Limits for tasks assessed                                        Exercises      General Comments        Pertinent Vitals/Pain Pain Assessment: No/denies pain    Home Living                      Prior Function            PT Goals (current goals can now be found in the care plan section) Acute  Rehab PT Goals Patient Stated Goal: go home PT Goal Formulation: With patient Time For Goal Achievement: 08/02/19 Potential to Achieve Goals: Good Progress towards PT goals: Progressing toward goals    Frequency    Min 3X/week      PT Plan Current plan remains appropriate    Co-evaluation              AM-PAC PT "6 Clicks" Mobility   Outcome Measure  Help needed turning from your back to your side while in a flat bed without using bedrails?: A Little Help needed moving from lying on your back to sitting on the side of a flat bed without using bedrails?: A Little Help needed moving to and from  a bed to a chair (including a wheelchair)?: A Little Help needed standing up from a chair using your arms (e.g., wheelchair or bedside chair)?: A Little Help needed to walk in hospital room?: A Little Help needed climbing 3-5 steps with a railing? : A Little 6 Click Score: 18    End of Session Equipment Utilized During Treatment: Gait belt;Oxygen Activity Tolerance: Patient tolerated treatment well;Patient limited by fatigue Patient left: in chair;with chair alarm set;with call bell/phone within reach Nurse Communication: Mobility status PT Visit Diagnosis: Muscle weakness (generalized) (M62.81);Difficulty in walking, not elsewhere classified (R26.2)     Time: 8413-2440 PT Time Calculation (min) (ACUTE ONLY): 21 min  Charges:  $Gait Training: 8-22 mins                     Katherine [name] E, PT [place] Pager [phone]  Office [phone]    Katherine [name] D Katherine [name] 07/27/2019, 3:28 PM

## 2019-07-27 NOTE — Care Management Important Message (Signed)
Important Message  Patient Details  Name: Katherine [name] MRN: [id number] Date of Birth: Dec 23, 1955   Medicare Important Message Given:  Yes     Orbie Pyo 07/27/2019, 1:57 PM

## 2019-07-27 NOTE — Progress Notes (Addendum)
Inpatient Diabetes Program Recommendations  AACE/ADA: New Consensus Statement on Inpatient Glycemic Control (2015)  Target Ranges:  Prepandial:   less than 140 mg/dL      Peak postprandial:   less than 180 mg/dL (1-2 hours)      Critically ill patients:  140 - 180 mg/dL   Lab Results  Component Value Date   GLUCAP 110 (H) 07/27/2019   HGBA1C 6.6 (H) 07/23/2019    Review of Glycemic Control Results for KAE, LAUMAN (MRN [id number]) as of 07/27/2019 10:50  Ref. Range 07/26/2019 06:52 07/26/2019 11:23 07/26/2019 16:19 07/26/2019 19:57 07/27/2019 06:17  Glucose-Capillary Latest Ref Range: 70 - 99 mg/dL 140 (H) 233 (H) 276 (H) 312 (H) 110 (H)   Diabetes history: DM 2 Outpatient Diabetes medications: Basaglar 12 units Daily, Novolog 4 units bid for high glucose, Glimepiride 4 mg Daily, Metformin 500 mg bid Current orders for Inpatient glycemic control:  Lantus 6 units Daily Novolog 0-20 units tid  A1c 6.6% on 12/6  Inpatient Diabetes Program Recommendations:    Glucose trends increased >300 after meals yesterday after PO prednisone 30 mg Daily.  Pt received PO prednisone dose this am.  If in plan of care, consider Novolog 4 units tid meal coverage if pt eats >50% of meals for today. Titrate tomorrow.  Thanks,  Tama Headings RN, MSN, BC-ADM Inpatient Diabetes Coordinator Team Pager [phone] (8a-5p)

## 2019-07-27 NOTE — Plan of Care (Signed)
  Problem: Clinical Measurements: Goal: Respiratory complications will improve Outcome: Progressing   

## 2019-07-27 NOTE — Progress Notes (Signed)
ANTICOAGULATION CONSULT NOTE - Initial Consult  Pharmacy Consult for warfarin Indication: afib  Allergies  Allergen Reactions  . Tetracycline Hives    Patient tolerated Doxycycline Dec 2020  . Hydromorphone Hcl Nausea And Vomiting  . Niacin Other (See Comments)    Mouth blisters  . Niaspan [Niacin Er] Other (See Comments)    Mouth blisters  . Sulfa Antibiotics Nausea Only and Other (See Comments)    "Tears up stomach"  . Sulfonamide Derivatives Other (See Comments)    Reaction: per patient "tears her stomach up"  . Codeine Nausea And Vomiting  . Erythromycin Nausea And Vomiting  . Hydromorphone Hcl Nausea And Vomiting  . Morphine And Related Nausea And Vomiting  . Nalbuphine Nausea And Vomiting  . Sulfasalazine Nausea Only and Other (See Comments)    per patient "tears her stomach up", "Tears up stomach"  . Tape Rash and Other (See Comments)    No "plastic" tape," please    Patient Measurements: Height: 5\' 1"  (154.9 cm) Weight: 235 lb 3.7 oz (106.7 kg) IBW/kg (Calculated) : 47.8   Vital Signs: Temp: 98.4 F (36.9 C) (12/10 0614) Temp Source: Oral (12/10 0614) BP: 157/77 (12/10 0614) Pulse Rate: 82 (12/10 0614)  Labs: Recent Labs    07/25/19 0620 07/26/19 0213 07/27/19 0505  HGB 9.1* 9.0* 9.8*  HCT 28.1* 28.3* 31.4*  PLT 232 276 324  LABPROT 43.2* 33.0* 27.3*  INR 4.6* 3.2* 2.5*  CREATININE 0.83 0.95 1.03*    Estimated Creatinine Clearance: 63.8 mL/min (A) (by C-G formula based on SCr of 1.03 mg/dL (H)).   Medical History: Past Medical History:  Diagnosis Date  . Anemia   . Anxiety   . Bilateral carotid artery stenosis    Carotid duplex 12/6312: 9-70% LICA, 26-37% RICA, >85% RECA, f/u 1 yr suggested  . CAD (coronary artery disease) of bypass graft 5/01; 3/'02, 8/'03, 10/'04; 1/15   PCI x 5 to SVG-D1   . CAD in native artery 07/1993    3 Vessel Disease (LAD-D1 & RCA) -- CABG  . CAD S/P percutaneous coronary angioplasty    PCI to SVG-D1  insertion/native D1 x 4 = '01 -(S660 BMS 2.5 x 9 - insertion into D1; '02 - distal overlap ACS Pixel 2.5 x 8  BMS; '03 distal/native ISR/Thrombosis - Pixel 2.5 x 13; '04 - ISR-  Taxus 2.5 x 20 (covered all);; 1/15 - mid SVG-D1 (50% distal ISR) - Promus P 2.75 x 20 -- 2.8 mm  . COPD mixed type ([place])    Followed by Dr. Lamonte Sakai "pulmonologist said no COPD"  . Depression   . Depression with anxiety   . Diabetes mellitus type 2 in obese ([place])   . Diarrhea    started after cholecystectomy and mass removed from intestine  . Dyslipidemia, goal LDL below 70    08/2012: TC 137, TG 200, HDL 32!, LDL 45; on statin (followed by Dr.Deterding)  . ESRD (end stage renal disease) ([place]) 1991   s/p Cadaveric Renal Transplant [facility] - Dr. Jimmy Footman)   . Family history of adverse reaction to anesthesia    mom's bp dropped during/after anesthesia  . Fibromyalgia   . GERD (gastroesophageal reflux disease)   . Glomerulonephritis, chronic, rapidly progressive 23  . 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 07/1993   Rescue PTCA of RCA -- referred for CABG.  . H/O: GI bleed   . Headache    migraines in the past  . History of kidney stones   .  Hypertension associated with diabetes ([place])   . Morbid obesity ([place])   . MRSA (methicillin resistant staph aureus) culture positive   . OSA (obstructive sleep apnea)    no longer on CPAP or home O2, states she doesn't need now after lap band  . PAD (peripheral artery disease) ([place]) 08/2013   LEA Dopplers to be read by Dr. Fletcher Anon  . PAF (paroxysmal atrial fibrillation) ([place]) 06/2014   Noted on CardioNet Monitor  - rate ~112; no recurrent symptoms. Nonionic regulation because of frequent GI procedures. Patient prefers Plavix  . Pneumonia   . Recurrent boils    Bilateral Groin  . Rheumatoid arthritis ([place])    Per Patient Report; associated with OA  . 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 Servando Snare: LIMA-LAD, SVG-bifurcatingD1, SVG-rPDA  . S/p cadaver renal  transplant [place]  . Stroke [facility]) 2012   "right eye stroke- half blind now"  . Unstable angina ([place]) 5/01; 3/'02, 8/'03, 10/'04; 1/15   x 5 occurences since Inf-Post STEMI in 1994   Assessment: 63 yo female with h/o afib on warfarin. PTA warfarin dose 10mg  daily except 5mg  on Sun on which the patients previous INR was stable at 2. Last dose PTA 12/5. INR on admit 3.6. Warfarin 10mg  given 12/6. INR 4.6>3.2>2.5 today.   Goal of Therapy:  INR 2-3 Monitor platelets by anticoagulation protocol: Yes   Plan:  Warfarin 10 mg PO x 1 Daily INR Monitor for bleeding  Fawna Cranmer A. Levada Dy, PharmD, BCPS, FNKF Clinical Pharmacist [place] Please utilize Amion for appropriate phone number to reach the unit pharmacist ([place])   07/27/2019,7:46 AM

## 2019-07-27 NOTE — Progress Notes (Signed)
PROGRESS NOTE    Katherine [name]  IAX:[id number] DOB: Apr 15, 1956 DOA: 07/23/2019 PCP: [name], MD   Brief Narrative:  63 year old with history of DM2, HTN, HLD, rheumatoid arthritis, CAD status post CABG, ESRD status post renal transplant on chronic immunosuppression-Imuran/prednisone presented with URI symptoms and weakness.  COVID-19 was tested negative.  Chest x-ray did not show any active lung disease.  Due to concerns of her underlying infection, she was started initially on Rocephin and doxycycline which was transitioned to amoxicillin and oral doxycycline.  Received aggressive bronchodilators where she was able to bring up more sputum.  Steroids were also helping her and getting intermittent diuretics with IV Lasix.   Assessment & Plan:   Principal Problem:   Acute respiratory failure with hypoxia (HCC) Active Problems:   Extrinsic asthma   Morbid obesity - s/p Lap Band 9/'05   Hypertension associated with diabetes ([place])   S/P kidney transplant   Long term (current) use of anticoagulants   Asthma exacerbation   ESRD (end stage renal disease) (HCC)   OSA (obstructive sleep apnea)   Current chronic use of systemic steroids  Acute respiratory distress with mild hypoxia, 2 L nasal cannula.  New requirement -Hypoxia on room air down to 75% with minimal ambulation.  Still requiring 2 L nasal cannula.  This is a new requirement and therefore unsafe for discharge at home.  Overall making great improvement symptomatically. COVID-19-X2-negative, influenza-negative -Oral amoxicillin/doxycycline, complete total 7-day course -Bronchodilators, incentive spirometer and flutter valve.  Lasix 40 mg IV X2 doses -Follow-up sputum culture -On p.o. prednisone -Procalcitonin 0.45. -Echocardiogram 55 to 07%, chronic diastolic congestive heart failure grade 2  Atrial fibrillation, chronic Grade 2 diastolic dysfunction, ejection fraction 55 to 60% -On Tikosyn, metoprolol and Cardizem.  -Coumadin.  INR therapeutic.  Pharmacy managing. -Crestor 20 mg daily.  Metoprolol 25 mg p.o. twice daily  Chronic pain -On oxycodone at home, continue to closely monitor her mentation  Insulin-dependent diabetes mellitus type 2 -Continue Lantus.  Insulin sliding scale and Accu-Chek.  Diabetic coordinator following.  NovoLog 4 units premeal  History of ESRD now with renal transplant Chronic immunosuppression -On Imuran and prednisone.  Previous provider had discussed with nephrology.  GERD -PPI  Generalized weakness -PT/OT-home health  History of anxiety/bipolar -Doing well on Zoloft and lorazepam  DVT prophylaxis: Coumadin Code Status: Full Family Communication: None at bedside Disposition Plan: Still on 2 L nasal cannula saturating down to 75% on room air with minimal ambulation.  Unsafe for discharge.  Maintain hospital stay    Subjective: Tells me her breathing has improved and she is able to bring up more sputum but still requiring 2 L of oxygen.  Desaturation down to 75% with minimal ambulation.  Very mild exertional shortness of breath.  Review of Systems Otherwise negative except as per HPI, including: General = no fevers, chills, dizziness, malaise, fatigue HEENT/EYES = negative for pain, redness, loss of vision, double vision, blurred vision, loss of hearing, sore throat, hoarseness, dysphagia Cardiovascular= negative for chest pain, palpitation, murmurs, lower extremity swelling Respiratory/lungs= negative forhemoptysis, wheezing, mucus production Gastrointestinal= negative for nausea, vomiting,, abdominal pain, melena, hematemesis Genitourinary= negative for Dysuria, Hematuria, Change in Urinary Frequency MSK = Negative for arthralgia, myalgias, Back Pain, Joint swelling  Neurology= Negative for headache, seizures, numbness, tingling  Psychiatry= Negative for anxiety, depression, suicidal and homocidal ideation Allergy/Immunology= Medication/Food allergy as  listed  Skin= Negative for Rash, lesions, ulcers, itching   Objective: Vitals:   07/26/19 2100 07/27/19 8675  07/27/19 0805 07/27/19 0918  BP:  (!) 157/77  (!) 165/73  Pulse:  82  89  Resp:  16  18  Temp:  98.4 F (36.9 C)  97.9 F (36.6 C)  TempSrc:  Oral    SpO2: 95% 91% 96% 94%  Weight:      Height:        Intake/Output Summary (Last 24 hours) at 07/27/2019 1141 Last data filed at 07/27/2019 0600 Gross per 24 hour  Intake 1040 ml  Output 1900 ml  Net -860 ml   Filed Weights   07/23/19 1820 07/25/19 0352 07/26/19 0517  Weight: 108.8 kg 108.2 kg 106.7 kg    Examination:  Constitutional: Not in acute distress, 2 L nasal cannula Respiratory: Very minimal expiratory wheezing.  Diffuse mild coarse breath sounds. Cardiovascular: Normal sinus rhythm, no rubs Abdomen: Nontender nondistended good bowel sounds Musculoskeletal: No edema noted Skin: No rashes seen Neurologic: CN 2-12 grossly intact.  And nonfocal Psychiatric: Normal judgment and insight. Alert and oriented x 3. Normal mood.   Data Reviewed:   CBC: Recent Labs  Lab 07/23/19 0452 07/23/19 1830 07/24/19 0626 07/25/19 0620 07/26/19 0213 07/27/19 0505  WBC 10.4 11.2* 12.2* 8.0 6.6 6.8  NEUTROABS 8.2* 8.9*  --   --   --   --   HGB 10.1* 9.7* 9.2* 9.1* 9.0* 9.8*  HCT 31.9* 31.1* 29.5* 28.1* 28.3* 31.4*  MCV 99.7 99.7 98.7 96.9 98.3 97.5  PLT 246 217 234 232 276 767   Basic Metabolic Panel: Recent Labs  Lab 07/23/19 1830 07/24/19 0626 07/25/19 0620 07/26/19 0213 07/27/19 0505  NA 134* 138 136 137 137  K 4.1 4.5 4.2 3.7 4.6  CL 101 104 103 101 98  CO2 23 21* 21* 24 25  GLUCOSE 114* 158* 207* 170* 128*  BUN 8 12 16 16 22   CREATININE 0.95 1.01* 0.83 0.95 1.03*  CALCIUM 8.7* 8.7* 8.7* 8.9 9.2  MG 1.5* 1.8 2.0 1.8 2.0  PHOS 3.9  --   --   --   --    GFR: Estimated Creatinine Clearance: 63.8 mL/min (A) (by C-G formula based on SCr of 1.03 mg/dL (H)). Liver Function Tests: Recent Labs  Lab  07/23/19 0452  AST 17  ALT 10  ALKPHOS 112  BILITOT 0.5  PROT 6.6  ALBUMIN 2.8*   No results for input(s): LIPASE, AMYLASE in the last 168 hours. No results for input(s): AMMONIA in the last 168 hours. Coagulation Profile: Recent Labs  Lab 07/23/19 1830 07/24/19 0626 07/25/19 0620 07/26/19 0213 07/27/19 0505  INR 3.6* 4.4* 4.6* 3.2* 2.5*   Cardiac Enzymes: No results for input(s): CKTOTAL, CKMB, CKMBINDEX, TROPONINI in the last 168 hours. BNP (last 3 results) No results for input(s): PROBNP in the last 8760 hours. HbA1C: No results for input(s): HGBA1C in the last 72 hours. CBG: Recent Labs  Lab 07/26/19 0652 07/26/19 1123 07/26/19 1619 07/26/19 1957 07/27/19 0617  GLUCAP 140* 233* 276* 312* 110*   Lipid Profile: No results for input(s): CHOL, HDL, LDLCALC, TRIG, CHOLHDL, LDLDIRECT in the last 72 hours. Thyroid Function Tests: No results for input(s): TSH, T4TOTAL, FREET4, T3FREE, THYROIDAB in the last 72 hours. Anemia Panel: No results for input(s): VITAMINB12, FOLATE, FERRITIN, TIBC, IRON, RETICCTPCT in the last 72 hours. Sepsis Labs: Recent Labs  Lab 07/23/19 0453 07/23/19 0608 07/23/19 1830  PROCALCITON  --   --  0.45  LATICACIDVEN 1.6 1.2  --     Recent Results (from the past  240 hour(s))  SARS CORONAVIRUS 2 (TAT 6-24 HRS) Nasopharyngeal Nasopharyngeal Swab     Status: None   Collection Time: 07/23/19  6:05 AM   Specimen: Nasopharyngeal Swab  Result Value Ref Range Status   SARS Coronavirus 2 NEGATIVE NEGATIVE Final    Comment: (NOTE) SARS-CoV-2 target nucleic acids are NOT DETECTED. The SARS-CoV-2 RNA is generally detectable in upper and lower respiratory specimens during the acute phase of infection. Negative results do not preclude SARS-CoV-2 infection, do not rule out co-infections with other pathogens, and should not be used as the sole basis for treatment or other patient management decisions. Negative results must be combined with clinical  observations, patient history, and epidemiological information. The expected result is Negative. Fact Sheet for Patients: SugarRoll.be Fact Sheet for Healthcare Providers: https://www.woods-mathews.com/ This test is not yet approved or cleared by the [place] FDA and  has been authorized for detection and/or diagnosis of SARS-CoV-2 by FDA under an Emergency Use Authorization (EUA). This EUA will remain  in effect (meaning this test can be used) for the duration of the COVID-19 declaration under Section 56 4(b)(1) of the Act, 21 U.S.C. section 360bbb-3(b)(1), unless the authorization is terminated or revoked sooner. Performed at [facility], [place] [address], [place], [place] [address]   Culture, blood (Routine X 2) w Reflex to ID Panel     Status: None (Preliminary result)   Collection Time: 07/23/19  6:39 PM   Specimen: BLOOD  Result Value Ref Range Status   Specimen Description BLOOD RIGHT ARM  Final   Special Requests   Final    BOTTLES DRAWN AEROBIC ONLY Blood Culture results may not be optimal due to an inadequate volume of blood received in culture bottles   Culture   Final    NO GROWTH 4 DAYS Performed at [facility], [place] [address], [place], [place] [address]    Report Status PENDING  Incomplete  Culture, blood (Routine X 2) w Reflex to ID Panel     Status: None (Preliminary result)   Collection Time: 07/23/19  6:50 PM   Specimen: BLOOD  Result Value Ref Range Status   Specimen Description BLOOD RIGHT HAND  Final   Special Requests   Final    BOTTLES DRAWN AEROBIC ONLY Blood Culture adequate volume   Culture   Final    NO GROWTH 4 DAYS Performed at [facility], [place] [address], [place], [place] [address]    Report Status PENDING  Incomplete  SARS CORONAVIRUS 2 (TAT 6-24 HRS) Nasopharyngeal Nasopharyngeal Swab     Status: None   Collection Time: 07/24/19 11:30 AM   Specimen: Nasopharyngeal Swab  Result  Value Ref Range Status   SARS Coronavirus 2 NEGATIVE NEGATIVE Final    Comment: (NOTE) SARS-CoV-2 target nucleic acids are NOT DETECTED. The SARS-CoV-2 RNA is generally detectable in upper and lower respiratory specimens during the acute phase of infection. Negative results do not preclude SARS-CoV-2 infection, do not rule out co-infections with other pathogens, and should not be used as the sole basis for treatment or other patient management decisions. Negative results must be combined with clinical observations, patient history, and epidemiological information. The expected result is Negative. Fact Sheet for Patients: SugarRoll.be Fact Sheet for Healthcare Providers: https://www.woods-mathews.com/ This test is not yet approved or cleared by the [place] FDA and  has been authorized for detection and/or diagnosis of SARS-CoV-2 by FDA under an Emergency Use Authorization (EUA). This EUA will remain  in effect (meaning this test can be used) for the duration of the COVID-19 declaration under Section 56 4(b)(1) of the Act, 21 U.S.C. section 360bbb-3(b)(1), unless the authorization is terminated or revoked sooner. Performed at [facility], [place] [address] [place], [place] [address]   Expectorated sputum assessment w rflx to resp cult     Status: None   Collection Time: 07/25/19  9:53 AM   Specimen: Sputum  Result Value Ref Range Status   Specimen Description SPUTUM  Final   Special Requests Immunocompromised  Final   Sputum evaluation   Final    THIS SPECIMEN IS ACCEPTABLE FOR SPUTUM CULTURE Performed at [facility], [place] [address] [place], [place] [address]    Report Status 07/26/2019 FINAL  Final  Culture, respiratory     Status: None (Preliminary result)   Collection Time: 07/25/19  9:53 AM   Specimen: SPU  Result Value Ref Range Status   Specimen Description SPUTUM  Final   Special Requests Immunocompromised  Reflexed from [id number]  Final   Gram Stain   Final    ABUNDANT WBC PRESENT,BOTH PMN AND MONONUCLEAR RARE GRAM POSITIVE COCCI FEW GRAM VARIABLE ROD RARE YEAST    Culture   Final    RARE Consistent with normal respiratory flora. Performed at [facility], [place] [address] [place], [place] [address]    Report Status PENDING  Incomplete         Radiology Studies: No results found.      Scheduled Meds: . amoxicillin-clavulanate  1 tablet Oral Q12H  . azaTHIOprine  125 mg Oral QPM  . calcitRIOL  0.25 mcg Oral Q3 days  . carbamazepine  200 mg Oral BID  . cyclobenzaprine  10 mg Oral BID  . diltiazem  120 mg Oral Daily  . dofetilide  250 mcg Oral BID  . doxycycline  100 mg Oral Q12H  . DULoxetine  60 mg Oral Daily  . famotidine  20 mg Oral Daily  . fluticasone furoate-vilanterol  1 puff Inhalation Daily  . guaiFENesin  1,200 mg Oral BID  . insulin aspart  0-20 Units Subcutaneous TID WC  . insulin glargine  6 Units Subcutaneous Daily  . ipratropium-albuterol  3 mL Nebulization BID  . metoprolol tartrate  25 mg Oral BID  . montelukast  10 mg Oral Daily  . nystatin  5 mL Oral QID  . pantoprazole  40 mg Oral Daily  . rosuvastatin  20 mg Oral Daily  . sertraline  200 mg Oral Daily  . warfarin  10 mg Oral ONCE-1800  . Warfarin - Pharmacist Dosing Inpatient   Does not apply q1800   Continuous Infusions: . sodium chloride Stopped (07/24/19 1800)     LOS: 3 days   Time spent= 35 mins    Rickey Farrier Arsenio Loader, MD [facility]  If 7PM-7AM, please contact night-coverage  07/27/2019, 11:41 AM

## 2019-07-27 NOTE — Plan of Care (Signed)
  Problem: Activity: Goal: Risk for activity intolerance will decrease Outcome: Progressing   

## 2019-07-28 LAB — PROTIME-INR
INR: 3.3 — ABNORMAL HIGH (ref 0.8–1.2)
Prothrombin Time: 33.1 seconds — ABNORMAL HIGH (ref 11.4–15.2)

## 2019-07-28 LAB — CULTURE, BLOOD (ROUTINE X 2)
Culture: NO GROWTH
Culture: NO GROWTH
Special Requests: ADEQUATE

## 2019-07-28 LAB — GLUCOSE, CAPILLARY
Glucose-Capillary: 136 mg/dL — ABNORMAL HIGH (ref 70–99)
Glucose-Capillary: 258 mg/dL — ABNORMAL HIGH (ref 70–99)

## 2019-07-28 MED ORDER — PREDNISONE 5 MG PO TABS
5.0000 mg | ORAL_TABLET | Freq: Every day | ORAL | Status: DC
  Administered 2019-07-28: 5 mg via ORAL
  Filled 2019-07-28: qty 1

## 2019-07-28 NOTE — Progress Notes (Signed)
ANTICOAGULATION CONSULT NOTE - Initial Consult  Pharmacy Consult for warfarin Indication: afib  Allergies  Allergen Reactions  . Tetracycline Hives    Patient tolerated Doxycycline Dec 2020  . Hydromorphone Hcl Nausea And Vomiting  . Niacin Other (See Comments)    Mouth blisters  . Niaspan [Niacin Er] Other (See Comments)    Mouth blisters  . Sulfa Antibiotics Nausea Only and Other (See Comments)    "Tears up stomach"  . Sulfonamide Derivatives Other (See Comments)    Reaction: per patient "tears her stomach up"  . Codeine Nausea And Vomiting  . Erythromycin Nausea And Vomiting  . Hydromorphone Hcl Nausea And Vomiting  . Morphine And Related Nausea And Vomiting  . Nalbuphine Nausea And Vomiting  . Sulfasalazine Nausea Only and Other (See Comments)    per patient "tears her stomach up", "Tears up stomach"  . Tape Rash and Other (See Comments)    No "plastic" tape," please    Patient Measurements: Height: 5\' 1"  (154.9 cm) Weight: 235 lb 7.2 oz (106.8 kg) IBW/kg (Calculated) : 47.8   Vital Signs: Temp: 97.7 F (36.5 C) (12/11 0421) Temp Source: Oral (12/10 2031) BP: 147/76 (12/11 0421) Pulse Rate: 71 (12/11 0421)  Labs: Recent Labs    07/26/19 0213 07/27/19 0505 07/28/19 0352  HGB 9.0* 9.8*  --   HCT 28.3* 31.4*  --   PLT 276 324  --   LABPROT 33.0* 27.3* 33.1*  INR 3.2* 2.5* 3.3*  CREATININE 0.95 1.03*  --     Estimated Creatinine Clearance: 63.8 mL/min (A) (by C-G formula based on SCr of 1.03 mg/dL (H)).   Medical History: Past Medical History:  Diagnosis Date  . Anemia   . Anxiety   . Bilateral carotid artery stenosis    Carotid duplex 10/8180: 9-93% LICA, 71-69% RICA, >67% RECA, f/u 1 yr suggested  . CAD (coronary artery disease) of bypass graft 5/01; 3/'02, 8/'03, 10/'04; 1/15   PCI x 5 to SVG-D1   . CAD in native artery 07/1993    3 Vessel Disease (LAD-D1 & RCA) -- CABG  . CAD S/P percutaneous coronary angioplasty    PCI to SVG-D1  insertion/native D1 x 4 = '01 -(S660 BMS 2.5 x 9 - insertion into D1; '02 - distal overlap ACS Pixel 2.5 x 8  BMS; '03 distal/native ISR/Thrombosis - Pixel 2.5 x 13; '04 - ISR-  Taxus 2.5 x 20 (covered all);; 1/15 - mid SVG-D1 (50% distal ISR) - Promus P 2.75 x 20 -- 2.8 mm  . COPD mixed type ([place])    Followed by Dr. Lamonte Sakai "pulmonologist said no COPD"  . Depression   . Depression with anxiety   . Diabetes mellitus type 2 in obese ([place])   . Diarrhea    started after cholecystectomy and mass removed from intestine  . Dyslipidemia, goal LDL below 70    08/2012: TC 137, TG 200, HDL 32!, LDL 45; on statin (followed by Dr.Deterding)  . ESRD (end stage renal disease) ([place]) 1991   s/p Cadaveric Renal Transplant [facility] - Dr. Jimmy Footman)   . Family history of adverse reaction to anesthesia    mom's bp dropped during/after anesthesia  . Fibromyalgia   . GERD (gastroesophageal reflux disease)   . Glomerulonephritis, chronic, rapidly progressive 59  . 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 07/1993   Rescue PTCA of RCA -- referred for CABG.  . H/O: GI bleed   . Headache    migraines in the  past  . History of kidney stones   . Hypertension associated with diabetes ([place])   . Morbid obesity ([place])   . MRSA (methicillin resistant staph aureus) culture positive   . OSA (obstructive sleep apnea)    no longer on CPAP or home O2, states she doesn't need now after lap band  . PAD (peripheral artery disease) ([place]) 08/2013   LEA Dopplers to be read by Dr. Fletcher Anon  . PAF (paroxysmal atrial fibrillation) ([place]) 06/2014   Noted on CardioNet Monitor  - rate ~112; no recurrent symptoms. Nonionic regulation because of frequent GI procedures. Patient prefers Plavix  . Pneumonia   . Recurrent boils    Bilateral Groin  . Rheumatoid arthritis ([place])    Per Patient Report; associated with OA  . 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 Servando Snare: LIMA-LAD, SVG-bifurcatingD1, SVG-rPDA  . S/p cadaver renal  transplant [place]  . Stroke [facility]) 2012   "right eye stroke- half blind now"  . Unstable angina ([place]) 5/01; 3/'02, 8/'03, 10/'04; 1/15   x 5 occurences since Inf-Post STEMI in 1994   Assessment: 63 yo female with h/o afib on warfarin. PTA warfarin dose 10mg  daily except 5mg  on Sun on which the patient's previous INR was stable at 2. Last dose PTA 12/5. INR on admit 3.6. Warfarin 10mg  given 12/6. INR 2.5>>3.3 today.   Large jump in INR despite being on home dose of warfarin. This possibly could be due to interaction with abx. Will hold warfarin today and would recommend lower doses while on and immediately after abx.   Goal of Therapy:  INR 2-3 Monitor platelets by anticoagulation protocol: Yes   Plan:  Hold warfarin tonight Daily INR Monitor for bleeding  Humaira Sculley A. Levada Dy, PharmD, BCPS, FNKF Clinical Pharmacist [place] Please utilize Amion for appropriate phone number to reach the unit pharmacist ([place])   07/28/2019,7:37 AM

## 2019-07-28 NOTE — Consult Note (Signed)
   [facility] Inpatient Consult   07/28/2019  Katherine [name] 11/24/1955 [id number]    Patient checked forpotential [facility] Management services neededunder her Medicare/ [employer] ACO planwith 23% high risk score for unplanned readmission and hospitalization.   Patient had been previously outreached by [facility] CM coordinators but had difficulty establishing/ maintaining contact.   Review of patient's medical recordreveals as: Patient 63 y/o admitted on 07/23/19 for flu-like illness for past 3 days with weakness & SOB. She was found to have acute hypoxic respiratory failure 2/2 asthma exacerbation, complicated by restrictive lung disease. Past hx significant for DM2, HTN, HLD, RA, CAD s/p CABG, ESRD s/p renal transplant on chronic immunosuppression, restrictive lung disease 2/2 obesity, anxiety, fibromyalgia, stroke, & unstable angina.    She was on aggressive bronchodilators and steroids and getting intermittent diuretics with IV Lasix. Inpatient DM coordinator was following patient this admission.  Primary Care Provider isDr. Scarlette Calico with [facility], listed to provide transition of care follow-up.  Attempts to call patient butunsuccessful.  Call to nurses' station to be connected to patient's room but did not get any response.  Second attempt to call the unit and was informed that patient just discharged. Patient had transitionedto home prior to speaking to her. PT note reviewed and has recommended home health PT.  Chart review shows that patient lives with a roommate.    Please place a [facility] Care Management referral for community follow-up as appropriate.  Will benefit from [place] calls to follow-up post discharge.    For questions and referral, please call:   Edwena Felty A. Berkleigh Beckles, BSN, RN-BC [facility] Liaison Cell: [phone]

## 2019-07-28 NOTE — Discharge Summary (Signed)
Physician Discharge Summary  Katherine [name] IHW:[id number] DOB: [birth year] DOA: 07/23/2019  PCP: [name], MD  Admit date: 07/23/2019 Discharge date: 07/28/2019  Admitted From: Home Disposition: Home  Recommendations for Outpatient Follow-up:  1. Follow up with PCP in 1-2 weeks 2. Please obtain BMP/CBC in one week your next doctors visit.  3. Augmentin and doxycycline orally at home twice daily for 3 more days  Home Health: Arrangements made Equipment/Devices: Rolling walker with 5 inch wheels Discharge Condition: Stable CODE STATUS: Full code Diet recommendation: Renal  Brief/Interim Summary: 63 year old with history of DM2, HTN, HLD, rheumatoid arthritis, CAD status post CABG, ESRD status post renal transplant on chronic immunosuppression-Imuran/prednisone presented with URI symptoms and weakness.  COVID-19 was tested negative.  Chest x-ray did not show any active lung disease.  Due to concerns of her underlying infection, she was started initially on Rocephin and doxycycline which was transitioned to amoxicillin and oral doxycycline.  Received aggressive bronchodilators where she was able to bring up more sputum.  Steroids were also helping her and getting intermittent diuretics with IV Lasix.  Over the course of several days she started doing much better.  With ambulation on the day of discharge she was saturating greater than 90%.  She will be given 3 more days of oral Augmentin and doxycycline at the time of discharge.  Advised to continue using home bronchodilators.  I also asked her to take home incentive spirometer and flutter valve. Reached max benefit from hospital stay and stable for discharge.   Discharge Diagnoses:  Principal Problem:   Acute respiratory failure with hypoxia ([place]) Active Problems:   Extrinsic asthma   Morbid obesity - s/p Lap Band 9/'05   Hypertension associated with diabetes ([place])   S/P kidney transplant   Long term (current) use of  anticoagulants   Asthma exacerbation   ESRD (end stage renal disease) ([facility])   OSA (obstructive sleep apnea)   Current chronic use of systemic steroids  Acute respiratory distress with mild hypoxia -Greatly improved now saturating greater than 90% on room air. -COVID-19 negative, influenza negative. -We will give 3 more days of oral amoxicillin and doxycycline to complete her home regimen/course.  Transition back to home prednisone. -I-S/flutter valve. -Echocardiogram 55 to 49%, chronic diastolic congestive heart failure grade 2  Atrial fibrillation, chronic Grade 2 diastolic dysfunction, ejection fraction 55 to 60% -On Tikosyn, metoprolol and Cardizem. -Coumadin.   Recheck INR early next week and follow-up outpatient. -Crestor 20 mg daily.  Metoprolol 25 mg p.o. twice daily  Chronic pain -On oxycodone at home, continue to closely monitor her mentation  Insulin-dependent diabetes mellitus type 2 -Resume home medications.  History of ESRD now with renal transplant Chronic immunosuppression -On Imuran and prednisone.  Previous provider had discussed with nephrology.  GERD -PPI  Generalized weakness -PT/OT-home health  History of anxiety/bipolar -Doing well on Zoloft and lorazepam  Consultations:  None  Subjective: Feels much better.  Denies any shortness of breath. Ambulatory pulse ox greater than 90%  Discharge Exam: Vitals:   07/28/19 1015 07/28/19 1021  BP:    Pulse:    Resp:    Temp:    SpO2: 93% 91%   Vitals:   07/28/19 0830 07/28/19 0905 07/28/19 1015 07/28/19 1021  BP:  (!) 145/71    Pulse: 72 87    Resp: 18 18    Temp:  98.1 F (36.7 C)    TempSrc:  Oral    SpO2: 98% 97% 93% 91%  Weight:      Height:        General: Pt is alert, awake, not in acute distress Cardiovascular: RRR, S1/S2 +, no rubs, no gallops Respiratory: Minimal rhonchi-greatly improved. Abdominal: Soft, NT, ND, bowel sounds + Extremities: no edema, no  cyanosis  Discharge Instructions   Allergies as of 07/28/2019      Reactions   Tetracycline Hives   Patient tolerated Doxycycline Dec 2020   Hydromorphone Hcl Nausea And Vomiting   Niacin Other (See Comments)   Mouth blisters   Niaspan [niacin Er] Other (See Comments)   Mouth blisters   Sulfa Antibiotics Nausea Only, Other (See Comments)   "Tears up stomach"   Sulfonamide Derivatives Other (See Comments)   Reaction: per patient "tears her stomach up"   Codeine Nausea And Vomiting   Erythromycin Nausea And Vomiting   Hydromorphone Hcl Nausea And Vomiting   Morphine And Related Nausea And Vomiting   Nalbuphine Nausea And Vomiting   Sulfasalazine Nausea Only, Other (See Comments)   per patient "tears her stomach up", "Tears up stomach"   Tape Rash, Other (See Comments)   No "plastic" tape," please      Medication List    TAKE these medications   acetaminophen 500 MG tablet Commonly known as: TYLENOL Take 1 tablet (500 mg total) by mouth every 6 (six) hours as needed for moderate pain or headache. What changed: how much to take   albuterol (2.5 MG/3ML) 0.083% nebulizer solution Commonly known as: PROVENTIL Take 3 mLs (2.5 mg total) by nebulization every 6 (six) hours as needed for wheezing or shortness of breath (dx: J45.20). What changed: Another medication with the same name was changed. Make sure you understand how and when to take each.   Ventolin HFA 108 (90 Base) MCG/ACT inhaler Generic drug: albuterol TAKE 2 PUFFS BY MOUTH EVERY 6 HOURS AS NEEDED FOR WHEEZE OR SHORTNESS OF BREATH What changed: See the new instructions.   amoxicillin-clavulanate 875-125 MG tablet Commonly known as: AUGMENTIN Take 1 tablet by mouth every 12 (twelve) hours for 6 doses.   azaTHIOprine 50 MG tablet Commonly known as: IMURAN Take 125 mg by mouth every evening.   Basaglar KwikPen 100 UNIT/ML Sopn Inject 12 Units into the skin daily.   budesonide 0.5 MG/2ML nebulizer  solution Commonly known as: PULMICORT Take 2 mLs by nebulization 2 (two) times daily.   calcitRIOL 0.25 MCG capsule Commonly known as: ROCALTROL Take 0.25 mcg by mouth every 3 (three) days.   carbamazepine 200 MG tablet Commonly known as: TEGRETOL Take 1 tablet (200 mg total) by mouth 2 (two) times daily.   clotrimazole 10 MG troche Commonly known as: MYCELEX Take 1 tablet (10 mg total) by mouth 5 (five) times daily.   cyclobenzaprine 10 MG tablet Commonly known as: FLEXERIL TAKE 1 TABLET BY MOUTH 3 TIMES DAILY AS NEEDED FOR MUSCLE SPASMS What changed: See the new instructions.   diltiazem 120 MG 24 hr capsule Commonly known as: CARDIZEM CD TAKE 1 CAPSULE BY MOUTH EVERY DAY What changed: how much to take   dofetilide 250 MCG capsule Commonly known as: TIKOSYN TAKE 1 CAPSULE BY MOUTH 2 (TWO) TIMES DAILY. What changed:   how much to take  how to take this  when to take this   doxycycline 100 MG tablet Commonly known as: VIBRA-TABS Take 1 tablet (100 mg total) by mouth every 12 (twelve) hours for 6 doses.   DULoxetine 30 MG capsule Commonly known as: CYMBALTA TAKE 2  CAPSULES (60 MG TOTAL) BY MOUTH EVERY MORNING. What changed: See the new instructions.   famotidine 20 MG tablet Commonly known as: PEPCID Take 20 mg by mouth daily.   fluticasone 50 MCG/ACT nasal spray Commonly known as: FLONASE PLACE 2 SPRAYS INTO BOTH NOSTRILS 2 TIMES DAILY What changed: See the new instructions.   glimepiride 4 MG tablet Commonly known as: AMARYL Take 4 mg by mouth every morning.   guaiFENesin 600 MG 12 hr tablet Commonly known as: MUCINEX Take 1 tablet (600 mg total) by mouth 2 (two) times daily.   LORazepam 1 MG tablet Commonly known as: ATIVAN Take 1 mg by mouth daily as needed for anxiety. Takes daily   metFORMIN 500 MG tablet Commonly known as: GLUCOPHAGE Take 500 mg by mouth 2 (two) times daily.   metoprolol tartrate 25 MG tablet Commonly known as:  LOPRESSOR TAKE 1 TABLET BY MOUTH TWICE A DAY   montelukast 10 MG tablet Commonly known as: SINGULAIR TAKE 1 TABLET BY MOUTH EVERY DAY What changed:   when to take this  additional instructions   nitroGLYCERIN 0.4 MG SL tablet Commonly known as: NITROSTAT Place 1 tablet (0.4 mg total) under the tongue every 5 (five) minutes as needed for chest pain.   NovoLOG FlexPen 100 UNIT/ML FlexPen Generic drug: insulin aspart Inject 4 Units into the skin 2 (two) times daily as needed for high blood sugar.   Oxycodone HCl 10 MG Tabs Take 1 tablet (10 mg total) by mouth every 8 (eight) hours as needed.   predniSONE 5 MG tablet Commonly known as: DELTASONE Take 5 mg by mouth daily.   promethazine 25 MG tablet Commonly known as: PHENERGAN Take 25 mg by mouth every 6 (six) hours as needed for nausea. Or for sleep   rosuvastatin 20 MG tablet Commonly known as: CRESTOR TAKE 1 TABLET BY MOUTH EVERY DAY   sertraline 100 MG tablet Commonly known as: ZOLOFT Take 200 mg by mouth daily.   sodium bicarbonate 650 MG tablet Take 1,300 mg by mouth 2 (two) times daily.   Symbicort 160-4.5 MCG/ACT inhaler Generic drug: budesonide-formoterol TAKE 2 PUFFS BY MOUTH TWICE A DAY What changed: See the new instructions.   warfarin 10 MG tablet Commonly known as: COUMADIN Take as directed. If you are unsure how to take this medication, talk to your nurse or doctor. Original instructions: TAKE 1 TABLET DAILY OR AS DIRECTED BY COUMADIN CLINIC What changed: See the new instructions.            Durable Medical Equipment  (From admission, onward)         Start     Ordered   07/28/19 0825  For home use only DME Walker rolling  Once    Comments: Rolling walker with 5" wheels  Question Answer Comment  Patient needs a walker to treat with the following condition Ambulatory dysfunction   Patient needs a walker to treat with the following condition General weakness      07/28/19 0824          Follow-up Information    [name], MD. Schedule an appointment as soon as possible for a visit in 1 week(s).   Specialty: Internal Medicine Contact information: [address] [address] [phone]        [name], MD .   Specialty: Cardiology Contact information: [address] [place] [address] [phone]  Allergies  Allergen Reactions  . Tetracycline Hives    Patient tolerated Doxycycline Dec 2020  . Hydromorphone Hcl Nausea And Vomiting  . Niacin Other (See Comments)    Mouth blisters  . Niaspan [Niacin Er] Other (See Comments)    Mouth blisters  . Sulfa Antibiotics Nausea Only and Other (See Comments)    "Tears up stomach"  . Sulfonamide Derivatives Other (See Comments)    Reaction: per patient "tears her stomach up"  . Codeine Nausea And Vomiting  . Erythromycin Nausea And Vomiting  . Hydromorphone Hcl Nausea And Vomiting  . Morphine And Related Nausea And Vomiting  . Nalbuphine Nausea And Vomiting  . Sulfasalazine Nausea Only and Other (See Comments)    per patient "tears her stomach up", "Tears up stomach"  . Tape Rash and Other (See Comments)    No "plastic" tape," please    You were cared for by a hospitalist during your hospital stay. If you have any questions about your discharge medications or the care you received while you were in the hospital after you are discharged, you can call the unit and asked to speak with the hospitalist on call if the hospitalist that took care of you is not available. Once you are discharged, your primary care physician will handle any further medical issues. Please note that no refills for any discharge medications will be authorized once you are discharged, as it is imperative that you return to your primary care physician (or establish a relationship with a primary care physician if you do not have one) for your aftercare needs so that they can reassess  your need for medications and monitor your lab values.   Procedures/Studies: DG Chest 2 View  Result Date: 07/25/2019 CLINICAL DATA:  Weakness, shortness of breath EXAM: CHEST - 2 VIEW COMPARISON:  07/23/2019 FINDINGS: Stable chronic interstitial prominence and areas of linear scarring. No pleural effusion or pneumothorax. Stable cardiomediastinal contours with evidence of prior CABG. Degenerative changes of the included spine. IMPRESSION: No acute process in the chest. Electronically Signed   By: Macy Mis M.D.   On: 07/25/2019 08:28   DG Chest Port 1 View  Result Date: 07/23/2019 CLINICAL DATA:  Shortness of breath and generalized weakness for 2 weeks. Chest pain. Nausea and vomiting. EXAM: PORTABLE CHEST 1 VIEW COMPARISON:  12/19/2018 FINDINGS: Stable mild cardiomegaly and prior CABG. Mild scarring seen in the left midlung. Chronic pulmonary interstitial prominence noted. No evidence of acute infiltrate or edema. No evidence of pleural effusion. IMPRESSION: Stable mild cardiomegaly. No active lung disease. Electronically Signed   By: Marlaine Hind M.D.   On: 07/23/2019 04:34   ECHOCARDIOGRAM COMPLETE  Result Date: 07/24/2019   ECHOCARDIOGRAM REPORT   Patient Name:   SHALETHA HUMBLE Date of Exam: 07/24/2019 Medical Rec #:  [id number]          Height:       61.0 in Accession #:    [id number]         Weight:       239.9 lb Date of Birth:  [birth year]          BSA:          2.04 m Patient Age:    74 years           BP:           136/62 mmHg Patient Gender: F  HR:           78 bpm. Exam Location:  Inpatient Procedure: 2D Echo Indications:    Dyspnea 786.09 / R06.00  History:        Patient has prior history of Echocardiogram examinations, most                 recent 10/27/2017. Prior CABG, Aortic valve sclerosis,                 Arrythmias:Atrial Fibrillation; Risk Factors:Dyslipidemia,                 Hypertension and Morbid obesity. ESRD                 Mitral annular calcification.   Sonographer:    Vikki Ports Turrentine Referring Phys: [id number] Jerald Kief A REGALADO  Sonographer Comments: Image acquisition challenging due to patient body habitus. IMPRESSIONS  1. Left ventricular ejection fraction, by visual estimation, is 55 to 60%. The left ventricle has normal function. There is no left ventricular hypertrophy.  2. Abnormal septal motion consistent with post-operative status.  3. Left ventricular diastolic parameters are consistent with Grade II diastolic dysfunction (pseudonormalization).  4. The left ventricle has no regional wall motion abnormalities.  5. Global right ventricle has normal systolic function.The right ventricular size is normal. No increase in right ventricular wall thickness.  6. Left atrial size was mildly dilated.  7. Right atrial size was mildly dilated.  8. Presence of pericardial fat pad.  9. Moderate mitral annular calcification. 10. The mitral valve is normal in structure. Trace mitral valve regurgitation. 11. The tricuspid valve is normal in structure. Tricuspid valve regurgitation is trivial. 12. The aortic valve has an indeterminant number of cusps. Aortic valve regurgitation is trivial. Mild aortic valve stenosis. 13. The pulmonic valve was grossly normal. Pulmonic valve regurgitation is not visualized. 14. The inferior vena cava is normal in size with greater than 50% respiratory variability, suggesting right atrial pressure of 3 mmHg. FINDINGS  Left Ventricle: Left ventricular ejection fraction, by visual estimation, is 55 to 60%. The left ventricle has normal function. The left ventricle has no regional wall motion abnormalities. There is no left ventricular hypertrophy. Abnormal (paradoxical) septal motion consistent with post-operative status. Left ventricular diastolic parameters are consistent with Grade II diastolic dysfunction (pseudonormalization). Right Ventricle: The right ventricular size is normal. No increase in right ventricular wall thickness. Global RV  systolic function is has normal systolic function. Left Atrium: Left atrial size was mildly dilated. Right Atrium: Right atrial size was mildly dilated Pericardium: There is no evidence of pericardial effusion. Presence of pericardial fat pad. Mitral Valve: The mitral valve is normal in structure. Moderate mitral annular calcification. MV peak gradient, 10.8 mmHg. Trace mitral valve regurgitation. Tricuspid Valve: The tricuspid valve is normal in structure. Tricuspid valve regurgitation is trivial. Aortic Valve: The aortic valve has an indeterminant number of cusps. . There is moderate thickening and moderate calcification of the aortic valve. Aortic valve regurgitation is trivial. Mild aortic stenosis is present. Mild aortic valve annular calcification. There is moderate thickening of the aortic valve. There is moderate calcification of the aortic valve. Aortic valve mean gradient measures 14.3 mmHg. Aortic valve peak gradient measures 24.9 mmHg. Aortic valve area, by VTI measures 1.38 cm. Pulmonic Valve: The pulmonic valve was grossly normal. Pulmonic valve regurgitation is not visualized. Aorta: The aortic root, ascending aorta and aortic arch are all structurally normal, with no evidence of dilitation or obstruction. Venous: The  inferior vena cava is normal in size with greater than 50% respiratory variability, suggesting right atrial pressure of 3 mmHg. IAS/Shunts: No atrial level shunt detected by color flow Doppler.  LEFT VENTRICLE PLAX 2D LVIDd:         4.40 cm  Diastology LVIDs:         3.00 cm  LV e' medial:   5.63 cm/s LV PW:         1.10 cm  LV E/e' medial: 26.6 LV IVS:        1.10 cm LVOT diam:     1.80 cm LV SV:         53 ml LV SV Index:   23.62 LVOT Area:     2.54 cm  LEFT ATRIUM             Index       RIGHT ATRIUM           Index LA Vol (A2C):   83.1 ml 40.73 ml/m RA Area:     16.90 cm LA Vol (A4C):   72.0 ml 35.29 ml/m RA Volume:   41.60 ml  20.39 ml/m LA Biplane Vol: 79.9 ml 39.16 ml/m   AORTIC VALVE AV Area (Vmax):    1.47 cm AV Area (Vmean):   1.46 cm AV Area (VTI):     1.38 cm AV Vmax:           249.33 cm/s AV Vmean:          178.000 cm/s AV VTI:            0.540 m AV Peak Grad:      24.9 mmHg AV Mean Grad:      14.3 mmHg LVOT Vmax:         144.00 cm/s LVOT Vmean:        102.000 cm/s LVOT VTI:          0.292 m LVOT/AV VTI ratio: 0.54  AORTA Ao Root diam: 2.70 cm MITRAL VALVE MV Area (PHT): 3.27 cm              SHUNTS MV Peak grad:  10.8 mmHg             Systemic VTI:  0.29 m MV Mean grad:  4.0 mmHg              Systemic Diam: 1.80 cm MV Vmax:       1.64 m/s MV Vmean:      89.8 cm/s MV VTI:        0.46 m MV PHT:        67.28 msec MV Decel Time: 232 msec MV E velocity: 150.00 cm/s 103 cm/s MV A velocity: 85.20 cm/s  70.3 cm/s MV E/A ratio:  1.76        1.5  Buford Dresser MD Electronically signed by Buford Dresser MD Signature Date/Time: 07/24/2019/9:36:03 PM    Final       The results of significant diagnostics from this hospitalization (including imaging, microbiology, ancillary and laboratory) are listed below for reference.     Microbiology: Recent Results (from the past 240 hour(s))  SARS CORONAVIRUS 2 (TAT 6-24 HRS) Nasopharyngeal Nasopharyngeal Swab     Status: None   Collection Time: 07/23/19  6:05 AM   Specimen: Nasopharyngeal Swab  Result Value Ref Range Status   SARS Coronavirus 2 NEGATIVE NEGATIVE Final    Comment: (NOTE) SARS-CoV-2 target nucleic acids are NOT DETECTED. The SARS-CoV-2 RNA is generally detectable  in upper and lower respiratory specimens during the acute phase of infection. Negative results do not preclude SARS-CoV-2 infection, do not rule out co-infections with other pathogens, and should not be used as the sole basis for treatment or other patient management decisions. Negative results must be combined with clinical observations, patient history, and epidemiological information. The expected result is Negative. Fact Sheet for  Patients: SugarRoll.be Fact Sheet for Healthcare Providers: https://www.woods-mathews.com/ This test is not yet approved or cleared by the [place] FDA and  has been authorized for detection and/or diagnosis of SARS-CoV-2 by FDA under an Emergency Use Authorization (EUA). This EUA will remain  in effect (meaning this test can be used) for the duration of the COVID-19 declaration under Section 56 4(b)(1) of the Act, 21 U.S.C. section 360bbb-3(b)(1), unless the authorization is terminated or revoked sooner. Performed at [facility], [place] [address], [place], [place] [address]   Culture, blood (Routine X 2) w Reflex to ID Panel     Status: None (Preliminary result)   Collection Time: 07/23/19  6:39 PM   Specimen: BLOOD  Result Value Ref Range Status   Specimen Description BLOOD RIGHT ARM  Final   Special Requests   Final    BOTTLES DRAWN AEROBIC ONLY Blood Culture results may not be optimal due to an inadequate volume of blood received in culture bottles   Culture   Final    NO GROWTH 4 DAYS Performed at [facility], [place] [address] [place], [place] [address]    Report Status PENDING  Incomplete  Culture, blood (Routine X 2) w Reflex to ID Panel     Status: None (Preliminary result)   Collection Time: 07/23/19  6:50 PM   Specimen: BLOOD  Result Value Ref Range Status   Specimen Description BLOOD RIGHT HAND  Final   Special Requests   Final    BOTTLES DRAWN AEROBIC ONLY Blood Culture adequate volume   Culture   Final    NO GROWTH 4 DAYS Performed at [facility], [place] [address] [place], [place] [address]    Report Status PENDING  Incomplete  SARS CORONAVIRUS 2 (TAT 6-24 HRS) Nasopharyngeal Nasopharyngeal Swab     Status: None   Collection Time: 07/24/19 11:30 AM   Specimen: Nasopharyngeal Swab  Result Value Ref Range Status   SARS Coronavirus 2 NEGATIVE NEGATIVE Final    Comment: (NOTE) SARS-CoV-2 target nucleic  acids are NOT DETECTED. The SARS-CoV-2 RNA is generally detectable in upper and lower respiratory specimens during the acute phase of infection. Negative results do not preclude SARS-CoV-2 infection, do not rule out co-infections with other pathogens, and should not be used as the sole basis for treatment or other patient management decisions. Negative results must be combined with clinical observations, patient history, and epidemiological information. The expected result is Negative. Fact Sheet for Patients: SugarRoll.be Fact Sheet for Healthcare Providers: https://www.woods-mathews.com/ This test is not yet approved or cleared by the [place] FDA and  has been authorized for detection and/or diagnosis of SARS-CoV-2 by FDA under an Emergency Use Authorization (EUA). This EUA will remain  in effect (meaning this test can be used) for the duration of the COVID-19 declaration under Section 56 4(b)(1) of the Act, 21 U.S.C. section 360bbb-3(b)(1), unless the authorization is terminated or revoked sooner. Performed at [facility], [place] [address] [place], [place] [address]   Expectorated sputum assessment w rflx to resp cult     Status:  None   Collection Time: 07/25/19  9:53 AM   Specimen: Sputum  Result Value Ref Range Status   Specimen Description SPUTUM  Final   Special Requests Immunocompromised  Final   Sputum evaluation   Final    THIS SPECIMEN IS ACCEPTABLE FOR SPUTUM CULTURE Performed at [facility], [address] [place], [place] [address]    Report Status 07/26/2019 FINAL  Final  Culture, respiratory     Status: None   Collection Time: 07/25/19  9:53 AM   Specimen: SPU  Result Value Ref Range Status   Specimen Description SPUTUM  Final   Special Requests Immunocompromised Reflexed from [id number]  Final   Gram Stain   Final    ABUNDANT WBC PRESENT,BOTH PMN AND MONONUCLEAR RARE GRAM POSITIVE COCCI FEW GRAM  VARIABLE ROD RARE YEAST    Culture   Final    RARE Consistent with normal respiratory flora. Performed at [facility], [place] [address] [place], [place] [address]    Report Status 07/27/2019 FINAL  Final     Labs: BNP (last 3 results) Recent Labs    07/23/19 1830  BNP 086.7*   Basic Metabolic Panel: Recent Labs  Lab 07/23/19 1830 07/24/19 0626 07/25/19 0620 07/26/19 0213 07/27/19 0505  NA 134* 138 136 137 137  K 4.1 4.5 4.2 3.7 4.6  CL 101 104 103 101 98  CO2 23 21* 21* 24 25  GLUCOSE 114* 158* 207* 170* 128*  BUN 8 12 16 16 22   CREATININE 0.95 1.01* 0.83 0.95 1.03*  CALCIUM 8.7* 8.7* 8.7* 8.9 9.2  MG 1.5* 1.8 2.0 1.8 2.0  PHOS 3.9  --   --   --   --    Liver Function Tests: Recent Labs  Lab 07/23/19 0452  AST 17  ALT 10  ALKPHOS 112  BILITOT 0.5  PROT 6.6  ALBUMIN 2.8*   No results for input(s): LIPASE, AMYLASE in the last 168 hours. No results for input(s): AMMONIA in the last 168 hours. CBC: Recent Labs  Lab 07/23/19 0452 07/23/19 1830 07/24/19 0626 07/25/19 0620 07/26/19 0213 07/27/19 0505  WBC 10.4 11.2* 12.2* 8.0 6.6 6.8  NEUTROABS 8.2* 8.9*  --   --   --   --   HGB 10.1* 9.7* 9.2* 9.1* 9.0* 9.8*  HCT 31.9* 31.1* 29.5* 28.1* 28.3* 31.4*  MCV 99.7 99.7 98.7 96.9 98.3 97.5  PLT 246 217 234 232 276 324   Cardiac Enzymes: No results for input(s): CKTOTAL, CKMB, CKMBINDEX, TROPONINI in the last 168 hours. BNP: Invalid input(s): POCBNP CBG: Recent Labs  Lab 07/27/19 0617 07/27/19 1146 07/27/19 1713 07/27/19 2030 07/28/19 0710  GLUCAP 110* 338* 193* 128* 136*   D-Dimer No results for input(s): DDIMER in the last 72 hours. Hgb A1c No results for input(s): HGBA1C in the last 72 hours. Lipid Profile No results for input(s): CHOL, HDL, LDLCALC, TRIG, CHOLHDL, LDLDIRECT in the last 72 hours. Thyroid function studies No results for input(s): TSH, T4TOTAL, T3FREE, THYROIDAB in the last 72 hours.  Invalid input(s): FREET3 Anemia  work up No results for input(s): VITAMINB12, FOLATE, FERRITIN, TIBC, IRON, RETICCTPCT in the last 72 hours. Urinalysis    Component Value Date/Time   COLORURINE YELLOW 07/23/2019 1231   APPEARANCEUR CLEAR 07/23/2019 1231   LABSPEC 1.019 07/23/2019 1231   PHURINE 6.0 07/23/2019 1231   GLUCOSEU NEGATIVE 07/23/2019 1231   HGBUR NEGATIVE 07/23/2019 [place] 07/23/2019 [place] 07/23/2019  [place] 07/23/2019 1231   UROBILINOGEN 0.2 10/16/2013 0110   NITRITE NEGATIVE 07/23/2019 1231   LEUKOCYTESUR NEGATIVE 07/23/2019 1231   Sepsis Labs Invalid input(s): PROCALCITONIN,  WBC,  LACTICIDVEN Microbiology Recent Results (from the past 240 hour(s))  SARS CORONAVIRUS 2 (TAT 6-24 HRS) Nasopharyngeal Nasopharyngeal Swab     Status: None   Collection Time: 07/23/19  6:05 AM   Specimen: Nasopharyngeal Swab  Result Value Ref Range Status   SARS Coronavirus 2 NEGATIVE NEGATIVE Final    Comment: (NOTE) SARS-CoV-2 target nucleic acids are NOT DETECTED. The SARS-CoV-2 RNA is generally detectable in upper and lower respiratory specimens during the acute phase of infection. Negative results do not preclude SARS-CoV-2 infection, do not rule out co-infections with other pathogens, and should not be used as the sole basis for treatment or other patient management decisions. Negative results must be combined with clinical observations, patient history, and epidemiological information. The expected result is Negative. Fact Sheet for Patients: SugarRoll.be Fact Sheet for Healthcare Providers: https://www.woods-mathews.com/ This test is not yet approved or cleared by the [place] FDA and  has been authorized for detection and/or diagnosis of SARS-CoV-2 by FDA under an Emergency Use Authorization (EUA). This EUA will remain  in effect (meaning this test can be used) for the duration of the COVID-19  declaration under Section 56 4(b)(1) of the Act, 21 U.S.C. section 360bbb-3(b)(1), unless the authorization is terminated or revoked sooner. Performed at [facility], [place] [address], [place], [place] [address]   Culture, blood (Routine X 2) w Reflex to ID Panel     Status: None (Preliminary result)   Collection Time: 07/23/19  6:39 PM   Specimen: BLOOD  Result Value Ref Range Status   Specimen Description BLOOD RIGHT ARM  Final   Special Requests   Final    BOTTLES DRAWN AEROBIC ONLY Blood Culture results may not be optimal due to an inadequate volume of blood received in culture bottles   Culture   Final    NO GROWTH 4 DAYS Performed at [facility], [place] [address] [place], [place] [address]    Report Status PENDING  Incomplete  Culture, blood (Routine X 2) w Reflex to ID Panel     Status: None (Preliminary result)   Collection Time: 07/23/19  6:50 PM   Specimen: BLOOD  Result Value Ref Range Status   Specimen Description BLOOD RIGHT HAND  Final   Special Requests   Final    BOTTLES DRAWN AEROBIC ONLY Blood Culture adequate volume   Culture   Final    NO GROWTH 4 DAYS Performed at [facility], [place] [address], [place], [place] [address]    Report Status PENDING  Incomplete  SARS CORONAVIRUS 2 (TAT 6-24 HRS) Nasopharyngeal Nasopharyngeal Swab     Status: None   Collection Time: 07/24/19 11:30 AM   Specimen: Nasopharyngeal Swab  Result Value Ref Range Status   SARS Coronavirus 2 NEGATIVE NEGATIVE Final    Comment: (NOTE) SARS-CoV-2 target nucleic acids are NOT DETECTED. The SARS-CoV-2 RNA is generally detectable in upper and lower respiratory specimens during the acute phase of infection. Negative results do not preclude SARS-CoV-2 infection, do not rule out co-infections with other pathogens, and should not be used as the sole basis for treatment or other patient management decisions. Negative results must be combined with clinical observations, patient  history, and epidemiological information. The expected result is Negative. Fact Sheet for Patients: SugarRoll.be  Fact Sheet for Healthcare Providers: https://www.woods-mathews.com/ This test is not yet approved or cleared by the [place] FDA and  has been authorized for detection and/or diagnosis of SARS-CoV-2 by FDA under an Emergency Use Authorization (EUA). This EUA will remain  in effect (meaning this test can be used) for the duration of the COVID-19 declaration under Section 56 4(b)(1) of the Act, 21 U.S.C. section 360bbb-3(b)(1), unless the authorization is terminated or revoked sooner. Performed at [facility], [place] [address], [place], [place] [address]   Expectorated sputum assessment w rflx to resp cult     Status: None   Collection Time: 07/25/19  9:53 AM   Specimen: Sputum  Result Value Ref Range Status   Specimen Description SPUTUM  Final   Special Requests Immunocompromised  Final   Sputum evaluation   Final    THIS SPECIMEN IS ACCEPTABLE FOR SPUTUM CULTURE Performed at [facility], [place] [address], [place], [place] [address]    Report Status 07/26/2019 FINAL  Final  Culture, respiratory     Status: None   Collection Time: 07/25/19  9:53 AM   Specimen: SPU  Result Value Ref Range Status   Specimen Description SPUTUM  Final   Special Requests Immunocompromised Reflexed from [id number]  Final   Gram Stain   Final    ABUNDANT WBC PRESENT,BOTH PMN AND MONONUCLEAR RARE GRAM POSITIVE COCCI FEW GRAM VARIABLE ROD RARE YEAST    Culture   Final    RARE Consistent with normal respiratory flora. Performed at [facility], [place] [address] [place], [place] [address]    Report Status 07/27/2019 FINAL  Final     Time coordinating discharge:  I have spent 35 minutes face to face with the patient and on the ward discussing the patients care, assessment, plan and disposition with other care givers. >50% of the time  was devoted counseling the patient about the risks and benefits of treatment/Discharge disposition and coordinating care.   SIGNED:   Damita Lack, MD  [facility] 07/28/2019, 10:22 AM   If 7PM-7AM, please contact night-coverage  \

## 2019-07-28 NOTE — Progress Notes (Signed)
SATURATION QUALIFICATIONS: (This note is used to comply with regulatory documentation for home oxygen)  Patient Saturations on Room Air at Rest = 93%  Patient Saturations on Room Air while Ambulating = 91%   Patient Saturations on 0 Liters of oxygen while Ambulating = did not require oxygen while ambulating.   Please briefly explain why patient needs home oxygen: Pt does not require home oxygen. She ambulated on room air and oxygen saturation stayed at 91%  Paulla Fore, RN, BSN

## 2019-07-31 ENCOUNTER — Telehealth: Payer: Self-pay | Admitting: *Deleted

## 2019-07-31 DIAGNOSIS — M797 Fibromyalgia: Secondary | ICD-10-CM

## 2019-07-31 NOTE — Telephone Encounter (Signed)
Katherine [name] is calling for a refill on her oxycodone.  The last refill was 06/09/2019 per PMP.  She was last seen in September, She has been in the hospital with pneumonia (disch 07/28/19)  and it will be 2 weeks before she would possibly be able to come in. She is asking for a refill to be sent to her pharmacy.

## 2019-07-31 NOTE — Telephone Encounter (Signed)
Called and LVM stating nurse was f/u after patient's hospital discharge. Nurse explained that the patient's instructions stated for them to make an appointment with PCP and nurse wanted to ensure they did not have any questions or concerns regarding their discharge. Nurse left her callback number for patient to reach her and she will call patient back later.   

## 2019-08-01 MED ORDER — OXYCODONE HCL 10 MG PO TABS: 10.0000 mg | ORAL_TABLET | Freq: Three times a day (TID) | ORAL | 0 refills | Status: DC | PRN

## 2019-08-01 NOTE — Telephone Encounter (Signed)
Transition Care Management Follow-up Telephone Call  How have you been since you were released from the hospital? I am weak and feel a little nauseated but overall I am getting better slowly.    Do you understand why you were in the hospital? yes   Do you understand the discharge instrcutions? yes  Items Reviewed:  Medications reviewed: yes  Allergies reviewed: yes  Dietary changes reviewed: NA  Referrals reviewed: yes   Functional Questionnaire:   Activities of Daily Living (ADLs):   She states they are independent in the following: ambulation, feeding, continence, grooming, toileting and dressing States they require assistance with the following: bathing and hygiene, states that niece can assist   Any transportation issues/concerns?: no   Any patient concerns? no   Confirmed importance and date/time of follow-up visits scheduled: yes, 08/07/19 @ 1400   Confirmed with patient if condition begins to worsen call PCP or go to the ER.  Patient was given the Call-a-Nurse line [phone]: no

## 2019-08-01 NOTE — Telephone Encounter (Signed)
Notified Ms Lejeune.

## 2019-08-01 NOTE — Telephone Encounter (Signed)
rx sent

## 2019-08-02 ENCOUNTER — Other Ambulatory Visit: Payer: Self-pay | Admitting: Cardiology

## 2019-08-02 NOTE — Telephone Encounter (Signed)
*  STAT* If patient is at the pharmacy, call can be transferred to refill team.   1. Which medications need to be refilled? (please list name of each medication and dose if known) metoprolol tartrate (LOPRESSOR) 25 MG tablet  2. Which pharmacy/location (including street and city if local pharmacy) is medication to be sent to? [id number] - , [place] - [address]  3. Do they need a 30 day or 90 day supply? 90 day  Only has 1 pill left

## 2019-08-02 NOTE — Telephone Encounter (Signed)
Made in error

## 2019-08-02 NOTE — Telephone Encounter (Signed)
*  STAT* If patient is at the pharmacy, call can be transferred to refill team.   1. Which medications need to be refilled? (please list name of each medication and dose if known) metoprolol tartrate (LOPRESSOR) 25 MG tablet  2. Which pharmacy/location (including street and city if local pharmacy) is medication to be sent to? [id number] - [place], [place].  3. Do they need a 30 day or 90 day supply?[place]

## 2019-08-03 ENCOUNTER — Other Ambulatory Visit: Payer: Self-pay | Admitting: Gastroenterology

## 2019-08-04 ENCOUNTER — Encounter (HOSPITAL_COMMUNITY): Admission: RE | Payer: Self-pay | Source: Home / Self Care

## 2019-08-04 ENCOUNTER — Ambulatory Visit (HOSPITAL_COMMUNITY): Admission: RE | Admit: 2019-08-04 | Payer: Medicare Other | Source: Home / Self Care | Admitting: Gastroenterology

## 2019-08-04 SURGERY — ESOPHAGOGASTRODUODENOSCOPY (EGD) WITH PROPOFOL
Anesthesia: Monitor Anesthesia Care

## 2019-08-07 ENCOUNTER — Inpatient Hospital Stay: Payer: Self-pay | Admitting: Internal Medicine

## 2019-08-08 ENCOUNTER — Emergency Department (HOSPITAL_COMMUNITY): Payer: Medicare Other

## 2019-08-08 ENCOUNTER — Inpatient Hospital Stay (HOSPITAL_COMMUNITY)
Admission: EM | Admit: 2019-08-08 | Discharge: 2019-08-13 | DRG: 871 | Disposition: A | Discharge status: Home Health | Payer: Medicare Other | Attending: Internal Medicine | Admitting: Internal Medicine

## 2019-08-08 ENCOUNTER — Encounter (HOSPITAL_COMMUNITY): Payer: Self-pay | Admitting: Emergency Medicine

## 2019-08-08 ENCOUNTER — Other Ambulatory Visit: Payer: Self-pay

## 2019-08-08 DIAGNOSIS — Z20828 Contact with and (suspected) exposure to other viral communicable diseases: Secondary | ICD-10-CM | POA: Diagnosis present

## 2019-08-08 DIAGNOSIS — R0902 Hypoxemia: Secondary | ICD-10-CM | POA: Diagnosis not present

## 2019-08-08 DIAGNOSIS — Z6841 Body Mass Index (BMI) 40.0 and over, adult: Secondary | ICD-10-CM

## 2019-08-08 DIAGNOSIS — I480 Paroxysmal atrial fibrillation: Secondary | ICD-10-CM | POA: Diagnosis present

## 2019-08-08 DIAGNOSIS — Z7952 Long term (current) use of systemic steroids: Secondary | ICD-10-CM

## 2019-08-08 DIAGNOSIS — D84821 Immunodeficiency due to drugs: Secondary | ICD-10-CM | POA: Diagnosis present

## 2019-08-08 DIAGNOSIS — Y95 Nosocomial condition: Secondary | ICD-10-CM | POA: Diagnosis present

## 2019-08-08 DIAGNOSIS — R0981 Nasal congestion: Secondary | ICD-10-CM

## 2019-08-08 DIAGNOSIS — Z955 Presence of coronary angioplasty implant and graft: Secondary | ICD-10-CM

## 2019-08-08 DIAGNOSIS — E785 Hyperlipidemia, unspecified: Secondary | ICD-10-CM | POA: Diagnosis present

## 2019-08-08 DIAGNOSIS — Z7951 Long term (current) use of inhaled steroids: Secondary | ICD-10-CM

## 2019-08-08 DIAGNOSIS — J189 Pneumonia, unspecified organism: Secondary | ICD-10-CM | POA: Diagnosis present

## 2019-08-08 DIAGNOSIS — I5032 Chronic diastolic (congestive) heart failure: Secondary | ICD-10-CM | POA: Diagnosis present

## 2019-08-08 DIAGNOSIS — M069 Rheumatoid arthritis, unspecified: Secondary | ICD-10-CM | POA: Diagnosis present

## 2019-08-08 DIAGNOSIS — J9602 Acute respiratory failure with hypercapnia: Secondary | ICD-10-CM | POA: Diagnosis present

## 2019-08-08 DIAGNOSIS — E119 Type 2 diabetes mellitus without complications: Secondary | ICD-10-CM | POA: Diagnosis present

## 2019-08-08 DIAGNOSIS — K219 Gastro-esophageal reflux disease without esophagitis: Secondary | ICD-10-CM | POA: Diagnosis present

## 2019-08-08 DIAGNOSIS — Z940 Kidney transplant status: Secondary | ICD-10-CM

## 2019-08-08 DIAGNOSIS — Z87891 Personal history of nicotine dependence: Secondary | ICD-10-CM

## 2019-08-08 DIAGNOSIS — J440 Chronic obstructive pulmonary disease with acute lower respiratory infection: Secondary | ICD-10-CM | POA: Diagnosis present

## 2019-08-08 DIAGNOSIS — R5381 Other malaise: Secondary | ICD-10-CM | POA: Diagnosis present

## 2019-08-08 DIAGNOSIS — J984 Other disorders of lung: Secondary | ICD-10-CM | POA: Diagnosis present

## 2019-08-08 DIAGNOSIS — Z79899 Other long term (current) drug therapy: Secondary | ICD-10-CM

## 2019-08-08 DIAGNOSIS — R0602 Shortness of breath: Secondary | ICD-10-CM

## 2019-08-08 DIAGNOSIS — E872 Acidosis, unspecified: Secondary | ICD-10-CM

## 2019-08-08 DIAGNOSIS — D509 Iron deficiency anemia, unspecified: Secondary | ICD-10-CM | POA: Diagnosis present

## 2019-08-08 DIAGNOSIS — I2510 Atherosclerotic heart disease of native coronary artery without angina pectoris: Secondary | ICD-10-CM | POA: Diagnosis present

## 2019-08-08 DIAGNOSIS — Z794 Long term (current) use of insulin: Secondary | ICD-10-CM

## 2019-08-08 DIAGNOSIS — J9601 Acute respiratory failure with hypoxia: Secondary | ICD-10-CM

## 2019-08-08 DIAGNOSIS — Z951 Presence of aortocoronary bypass graft: Secondary | ICD-10-CM | POA: Diagnosis not present

## 2019-08-08 DIAGNOSIS — A419 Sepsis, unspecified organism: Principal | ICD-10-CM

## 2019-08-08 DIAGNOSIS — I110 Hypertensive heart disease with heart failure: Secondary | ICD-10-CM | POA: Diagnosis present

## 2019-08-08 DIAGNOSIS — Z8249 Family history of ischemic heart disease and other diseases of the circulatory system: Secondary | ICD-10-CM

## 2019-08-08 DIAGNOSIS — Z8614 Personal history of Methicillin resistant Staphylococcus aureus infection: Secondary | ICD-10-CM

## 2019-08-08 DIAGNOSIS — Z7901 Long term (current) use of anticoagulants: Secondary | ICD-10-CM | POA: Diagnosis not present

## 2019-08-08 DIAGNOSIS — I252 Old myocardial infarction: Secondary | ICD-10-CM

## 2019-08-08 LAB — CBC WITH DIFFERENTIAL/PLATELET
Abs Immature Granulocytes: 0.06 10*3/uL (ref 0.00–0.07)
Basophils Absolute: 0 10*3/uL (ref 0.0–0.1)
Basophils Relative: 0 %
Eosinophils Absolute: 0.1 10*3/uL (ref 0.0–0.5)
Eosinophils Relative: 1 %
HCT: 29.8 % — ABNORMAL LOW (ref 36.0–46.0)
Hemoglobin: 8.9 g/dL — ABNORMAL LOW (ref 12.0–15.0)
Immature Granulocytes: 1 %
Lymphocytes Relative: 7 %
Lymphs Abs: 0.8 10*3/uL (ref 0.7–4.0)
MCH: 30.5 pg (ref 26.0–34.0)
MCHC: 29.9 g/dL — ABNORMAL LOW (ref 30.0–36.0)
MCV: 102.1 fL — ABNORMAL HIGH (ref 80.0–100.0)
Monocytes Absolute: 1 10*3/uL (ref 0.1–1.0)
Monocytes Relative: 9 %
Neutro Abs: 9.5 10*3/uL — ABNORMAL HIGH (ref 1.7–7.7)
Neutrophils Relative %: 82 %
Platelets: 330 10*3/uL (ref 150–400)
RBC: 2.92 MIL/uL — ABNORMAL LOW (ref 3.87–5.11)
RDW: 17 % — ABNORMAL HIGH (ref 11.5–15.5)
WBC: 11.5 10*3/uL — ABNORMAL HIGH (ref 4.0–10.5)
nRBC: 0 % (ref 0.0–0.2)

## 2019-08-08 LAB — COMPREHENSIVE METABOLIC PANEL
ALT: 12 U/L (ref 0–44)
AST: 17 U/L (ref 15–41)
Albumin: 2.6 g/dL — ABNORMAL LOW (ref 3.5–5.0)
Alkaline Phosphatase: 113 U/L (ref 38–126)
Anion gap: 11 (ref 5–15)
BUN: 12 mg/dL (ref 8–23)
CO2: 25 mmol/L (ref 22–32)
Calcium: 8.9 mg/dL (ref 8.9–10.3)
Chloride: 105 mmol/L (ref 98–111)
Creatinine, Ser: 0.87 mg/dL (ref 0.44–1.00)
GFR calc Af Amer: >60 mL/min (ref >60)
GFR calc non Af Amer: >60 mL/min (ref >60)
Glucose, Bld: 145 mg/dL — ABNORMAL HIGH (ref 70–99)
Potassium: 4.8 mmol/L (ref 3.5–5.1)
Sodium: 141 mmol/L (ref 135–145)
Total Bilirubin: 0.4 mg/dL (ref 0.3–1.2)
Total Protein: 5.9 g/dL — ABNORMAL LOW (ref 6.5–8.1)

## 2019-08-08 LAB — URINALYSIS, ROUTINE W REFLEX MICROSCOPIC
Bilirubin Urine: NEGATIVE
Glucose, UA: NEGATIVE mg/dL
Hgb urine dipstick: NEGATIVE
Ketones, ur: NEGATIVE mg/dL
Leukocytes,Ua: NEGATIVE
Nitrite: NEGATIVE
Protein, ur: NEGATIVE mg/dL
Specific Gravity, Urine: 1.011 (ref 1.005–1.030)
pH: 9 — ABNORMAL HIGH (ref 5.0–8.0)

## 2019-08-08 LAB — PROTIME-INR
INR: >10 (ref 0.8–1.2)
Prothrombin Time: >90 seconds — ABNORMAL HIGH (ref 11.4–15.2)

## 2019-08-08 LAB — LACTIC ACID, PLASMA
Lactic Acid, Venous: 2.7 mmol/L (ref 0.5–1.9)
Lactic Acid, Venous: 2.2 mmol/L (ref 0.5–1.9)

## 2019-08-08 LAB — POC SARS CORONAVIRUS 2 AG -  ED: SARS Coronavirus 2 Ag: NEGATIVE

## 2019-08-08 LAB — FIBRINOGEN: Fibrinogen: 575 mg/dL — ABNORMAL HIGH (ref 210–475)

## 2019-08-08 LAB — GLUCOSE, CAPILLARY: Glucose-Capillary: 62 mg/dL — ABNORMAL LOW (ref 70–99)

## 2019-08-08 LAB — FERRITIN: Ferritin: 31 ng/mL (ref 11–307)

## 2019-08-08 LAB — LACTATE DEHYDROGENASE: LDH: 306 U/L — ABNORMAL HIGH (ref 98–192)

## 2019-08-08 LAB — D-DIMER, QUANTITATIVE: D-Dimer, Quant: 0.27 ug/mL-FEU (ref 0.00–0.50)

## 2019-08-08 LAB — CBG MONITORING, ED: Glucose-Capillary: 93 mg/dL (ref 70–99)

## 2019-08-08 LAB — SARS CORONAVIRUS 2 (TAT 6-24 HRS): SARS Coronavirus 2: NEGATIVE

## 2019-08-08 MED ORDER — NITROGLYCERIN 0.4 MG SL SUBL: 0.4000 mg | SUBLINGUAL_TABLET | SUBLINGUAL | Status: DC | PRN

## 2019-08-08 MED ORDER — BUDESONIDE 0.5 MG/2ML IN SUSP: 2.0000 mL | Freq: Two times a day (BID) | RESPIRATORY_TRACT | Status: DC

## 2019-08-08 MED ORDER — FAMOTIDINE 20 MG PO TABS
20.0000 mg | ORAL_TABLET | Freq: Every day | ORAL | Status: DC
  Administered 2019-08-08 – 2019-08-12 (×5): 20 mg via ORAL
  Filled 2019-08-08 (×5): qty 1

## 2019-08-08 MED ORDER — SODIUM CHLORIDE 0.9 % IV BOLUS
500.0000 mL | Freq: Once | INTRAVENOUS | Status: AC
  Administered 2019-08-08: 500 mL via INTRAVENOUS

## 2019-08-08 MED ORDER — DULOXETINE HCL 60 MG PO CPEP
60.0000 mg | ORAL_CAPSULE | Freq: Every day | ORAL | Status: DC
  Administered 2019-08-09 – 2019-08-13 (×5): 60 mg via ORAL
  Filled 2019-08-08 (×5): qty 1

## 2019-08-08 MED ORDER — METOPROLOL TARTRATE 25 MG PO TABS
25.0000 mg | ORAL_TABLET | Freq: Two times a day (BID) | ORAL | Status: DC
  Administered 2019-08-08 – 2019-08-13 (×10): 25 mg via ORAL
  Filled 2019-08-08 (×10): qty 1

## 2019-08-08 MED ORDER — GUAIFENESIN ER 600 MG PO TB12
600.0000 mg | ORAL_TABLET | Freq: Two times a day (BID) | ORAL | Status: DC
  Administered 2019-08-08 – 2019-08-13 (×10): 600 mg via ORAL
  Filled 2019-08-08 (×10): qty 1

## 2019-08-08 MED ORDER — WARFARIN SODIUM 10 MG PO TABS: 10.0000 mg | ORAL_TABLET | Freq: Every day | ORAL | Status: DC

## 2019-08-08 MED ORDER — SODIUM CHLORIDE 0.9 % IV SOLN
1.0000 g | Freq: Once | INTRAVENOUS | Status: AC
  Administered 2019-08-08: 1 g via INTRAVENOUS
  Filled 2019-08-08: qty 1

## 2019-08-08 MED ORDER — PREDNISONE 5 MG PO TABS
5.0000 mg | ORAL_TABLET | Freq: Every day | ORAL | Status: DC
  Administered 2019-08-09 – 2019-08-13 (×5): 5 mg via ORAL
  Filled 2019-08-08 (×5): qty 1

## 2019-08-08 MED ORDER — OXYCODONE HCL 5 MG PO TABS
10.0000 mg | ORAL_TABLET | Freq: Three times a day (TID) | ORAL | Status: DC | PRN
  Administered 2019-08-08 – 2019-08-13 (×10): 10 mg via ORAL
  Filled 2019-08-08 (×10): qty 2

## 2019-08-08 MED ORDER — CYCLOBENZAPRINE HCL 10 MG PO TABS
10.0000 mg | ORAL_TABLET | Freq: Two times a day (BID) | ORAL | Status: DC
  Administered 2019-08-08 – 2019-08-13 (×10): 10 mg via ORAL
  Filled 2019-08-08 (×10): qty 1

## 2019-08-08 MED ORDER — ACETAMINOPHEN 325 MG PO TABS
650.0000 mg | ORAL_TABLET | Freq: Four times a day (QID) | ORAL | Status: DC | PRN
  Administered 2019-08-08 – 2019-08-11 (×5): 650 mg via ORAL
  Filled 2019-08-08 (×5): qty 2

## 2019-08-08 MED ORDER — PROMETHAZINE HCL 25 MG PO TABS
25.0000 mg | ORAL_TABLET | Freq: Four times a day (QID) | ORAL | Status: DC | PRN
  Administered 2019-08-09 – 2019-08-11 (×2): 25 mg via ORAL
  Filled 2019-08-08 (×2): qty 1

## 2019-08-08 MED ORDER — ROSUVASTATIN CALCIUM 20 MG PO TABS
20.0000 mg | ORAL_TABLET | Freq: Every day | ORAL | Status: DC
  Administered 2019-08-09 – 2019-08-13 (×5): 20 mg via ORAL
  Filled 2019-08-08 (×5): qty 1

## 2019-08-08 MED ORDER — SERTRALINE HCL 100 MG PO TABS
200.0000 mg | ORAL_TABLET | Freq: Every day | ORAL | Status: DC
  Administered 2019-08-09 – 2019-08-13 (×5): 200 mg via ORAL
  Filled 2019-08-08 (×5): qty 2

## 2019-08-08 MED ORDER — VANCOMYCIN HCL 1750 MG/350ML IV SOLN
1750.0000 mg | INTRAVENOUS | Status: DC
  Administered 2019-08-08 – 2019-08-09 (×2): 1750 mg via INTRAVENOUS
  Filled 2019-08-08 (×3): qty 350

## 2019-08-08 MED ORDER — SODIUM BICARBONATE 650 MG PO TABS
1300.0000 mg | ORAL_TABLET | Freq: Two times a day (BID) | ORAL | Status: DC
  Administered 2019-08-08 – 2019-08-13 (×10): 1300 mg via ORAL
  Filled 2019-08-08 (×11): qty 2

## 2019-08-08 MED ORDER — AZATHIOPRINE 50 MG PO TABS
125.0000 mg | ORAL_TABLET | Freq: Every evening | ORAL | Status: DC
  Administered 2019-08-09 – 2019-08-12 (×4): 125 mg via ORAL
  Filled 2019-08-08 (×6): qty 3

## 2019-08-08 MED ORDER — INSULIN ASPART 100 UNIT/ML ~~LOC~~ SOLN
4.0000 [IU] | Freq: Two times a day (BID) | SUBCUTANEOUS | Status: DC
  Administered 2019-08-08 – 2019-08-13 (×9): 4 [IU] via SUBCUTANEOUS

## 2019-08-08 MED ORDER — DOFETILIDE 250 MCG PO CAPS
250.0000 ug | ORAL_CAPSULE | Freq: Two times a day (BID) | ORAL | Status: DC
  Administered 2019-08-08 – 2019-08-13 (×10): 250 ug via ORAL
  Filled 2019-08-08 (×11): qty 1

## 2019-08-08 MED ORDER — CARBAMAZEPINE 200 MG PO TABS
200.0000 mg | ORAL_TABLET | Freq: Two times a day (BID) | ORAL | Status: DC
  Administered 2019-08-08 – 2019-08-13 (×10): 200 mg via ORAL
  Filled 2019-08-08 (×11): qty 1

## 2019-08-08 MED ORDER — LORAZEPAM 1 MG PO TABS
1.0000 mg | ORAL_TABLET | Freq: Every day | ORAL | Status: DC | PRN
  Administered 2019-08-09 – 2019-08-13 (×5): 1 mg via ORAL
  Filled 2019-08-08 (×5): qty 1

## 2019-08-08 MED ORDER — MONTELUKAST SODIUM 10 MG PO TABS
10.0000 mg | ORAL_TABLET | Freq: Every day | ORAL | Status: DC
  Administered 2019-08-09 – 2019-08-13 (×5): 10 mg via ORAL
  Filled 2019-08-08 (×5): qty 1

## 2019-08-08 MED ORDER — FLUTICASONE PROPIONATE 50 MCG/ACT NA SUSP
2.0000 | Freq: Two times a day (BID) | NASAL | Status: DC
  Administered 2019-08-09 – 2019-08-13 (×10): 2 via NASAL
  Filled 2019-08-08 (×2): qty 16

## 2019-08-08 MED ORDER — ACETAMINOPHEN 650 MG RE SUPP: 650.0000 mg | Freq: Four times a day (QID) | RECTAL | Status: DC | PRN

## 2019-08-08 MED ORDER — INSULIN ASPART 100 UNIT/ML FLEXPEN
4.0000 [IU] | PEN_INJECTOR | Freq: Two times a day (BID) | SUBCUTANEOUS | Status: DC
  Filled 2019-08-08: qty 3

## 2019-08-08 MED ORDER — ALBUTEROL SULFATE (2.5 MG/3ML) 0.083% IN NEBU: 2.5000 mg | INHALATION_SOLUTION | Freq: Four times a day (QID) | RESPIRATORY_TRACT | Status: DC | PRN

## 2019-08-08 MED ORDER — MOMETASONE FURO-FORMOTEROL FUM 200-5 MCG/ACT IN AERO
2.0000 | INHALATION_SPRAY | Freq: Two times a day (BID) | RESPIRATORY_TRACT | Status: DC
  Administered 2019-08-09 – 2019-08-13 (×8): 2 via RESPIRATORY_TRACT
  Filled 2019-08-08 (×2): qty 8.8

## 2019-08-08 MED ORDER — DILTIAZEM HCL ER COATED BEADS 120 MG PO CP24
120.0000 mg | ORAL_CAPSULE | Freq: Every day | ORAL | Status: DC
  Administered 2019-08-09 – 2019-08-13 (×5): 120 mg via ORAL
  Filled 2019-08-08 (×5): qty 1

## 2019-08-08 MED ORDER — INSULIN GLARGINE 100 UNIT/ML ~~LOC~~ SOLN
12.0000 [IU] | Freq: Every day | SUBCUTANEOUS | Status: DC
  Administered 2019-08-09 – 2019-08-13 (×5): 12 [IU] via SUBCUTANEOUS
  Filled 2019-08-08 (×5): qty 0.12

## 2019-08-08 NOTE — H&P (Signed)
History and Physical    Katherine [name] WYO:[id number] DOB: 1955/11/10 DOA: 08/08/2019  PCP: [name], MD   I have personally briefly reviewed patient's old medical records in [place]  Chief Complaint: Home  HPI: Katherine [name] is a 63 y.o. 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  Patient was hospitalized and discharged 10 days ago with 1 episode of atypical pneumonia, and was discharged with p.o. doxycycline and Augmentin.  However patient said she never felt she fully recovered from that episode of pneumonia, and her symptoms has returned just in the couple days after discharge and started to get worse.  She also called while green-yellowish sputum moderate amount, denied any night sweats, denied any Covid contact after discharge. She denies any CP.  temperatures as high as 99.2, chills without sweats. She denies exposure to COVID-19.  She states she felt this way once before when she was septic from a finger infection which she thinks is doing okay now.  05/2019 - just finished abx for a UTI the last several weeks which has been on doxy, then cipro and these symptoms have resolved.   ED Course: Temperature 99.7 in the ED, O2 saturation dropped with minimum activity, she was stabilized on 2 L at rest, x-ray showed chronic interstitial changes no acute infiltrates  review of Systems: As per HPI otherwise 10 point review of systems negative.   Past Medical History:  Diagnosis Date  . Anemia   . Anxiety   . Bilateral carotid artery stenosis    Carotid duplex 02/7411: 8-78% LICA, 67-67% RICA, >20% RECA, f/u 1 yr suggested  . CAD (coronary artery disease) of bypass graft 5/01; 3/'02, 8/'03,  10/'04; 1/15   PCI x 5 to SVG-D1   . CAD in native artery 07/1993    3 Vessel Disease (LAD-D1 & RCA) -- CABG  . CAD S/P percutaneous coronary angioplasty    PCI to SVG-D1 insertion/native D1 x 4 = '01 -(S660 BMS 2.5 x 9 - insertion into D1; '02 - distal overlap ACS Pixel 2.5 x 8  BMS; '03 distal/native ISR/Thrombosis - Pixel 2.5 x 13; '04 - ISR-  Taxus 2.5 x 20 (covered all);; 1/15 - mid SVG-D1 (50% distal ISR) - Promus P 2.75 x 20 -- 2.8 mm  . COPD mixed type ([place])    Followed by Dr. Lamonte Sakai "pulmonologist said no COPD"  . Depression   . Depression with anxiety   . Diabetes mellitus type 2 in obese ([place])   . Diarrhea    started after cholecystectomy and mass removed from intestine  . Dyslipidemia, goal LDL below 70    08/2012: TC 137, TG 200, HDL 32!, LDL 45; on statin (followed by Dr.Deterding)  . ESRD (end stage renal disease) ([place]) 1991   s/p Cadaveric Renal Transplant [facility] - Dr. Jimmy Footman)   . Family history of adverse reaction to anesthesia    mom's bp dropped during/after anesthesia  . Fibromyalgia   . GERD (gastroesophageal reflux disease)   . Glomerulonephritis, chronic, rapidly progressive 20  . 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 07/1993   Rescue PTCA of RCA -- referred for CABG.  . H/O: GI bleed   . Headache    migraines in the past  .  History of kidney stones   . Hypertension associated with diabetes ([place])   . Morbid obesity ([place])   . MRSA (methicillin resistant staph aureus) culture positive   . OSA (obstructive sleep apnea)    no longer on CPAP or home O2, states she doesn't need now after lap band  . PAD (peripheral artery disease) ([place]) 08/2013   LEA Dopplers to be read by Dr. Fletcher Anon  . PAF (paroxysmal atrial fibrillation) ([place]) 06/2014   Noted on CardioNet Monitor  - rate ~112; no recurrent symptoms. Nonionic regulation because of frequent GI procedures. Patient prefers Plavix  . Pneumonia   . Recurrent boils    Bilateral Groin   . Rheumatoid arthritis ([place])    Per Patient Report; associated with OA  . 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 Servando Snare: LIMA-LAD, SVG-bifurcatingD1, SVG-rPDA  . S/p cadaver renal transplant [facility]  . Stroke [facility]) 2012   "right eye stroke- half blind now"  . Unstable angina ([place]) 5/01; 3/'02, 8/'03, 10/'04; 1/15   x 5 occurences since Inf-Post STEMI in 1994    Past Surgical History:  Procedure Laterality Date  . ABDOMINAL AORTOGRAM N/A 04/21/2018   Procedure: ABDOMINAL AORTOGRAM;  Surgeon: [name], MD;  Location: [facility];  Service: Cardiovascular;  Laterality: N/A;  . CARDIAC CATHETERIZATION  5/'01, 3/'02, 8/'03, 10/'04; 1/'15   08/22/2013: LAD & RCA 100%; LIMA-LAD & SVG-rPDA patent; Cx-- OM1 60%, OM2 ostial ~50%; SVG-D1 - 80% mid, 50% distal ISR --PCI  . CATHETER REMOVAL    . CHOLECYSTECTOMY N/A 10/29/2014   Procedure: LAPAROSCOPIC CHOLECYSTECTOMY WITH INTRAOPERATIVE CHOLANGIOGRAM;  Surgeon: Excell Seltzer, MD;  Location: [facility];  Service: General;  Laterality: N/A;  . CORONARY ANGIOPLASTY  1994   x5  . CORONARY ARTERY BYPASS GRAFT  1995   LIMA-LAD, SVG-RPDA, SVG-D1  . ESOPHAGOGASTRODUODENOSCOPY N/A 10/15/2016   Procedure: ESOPHAGOGASTRODUODENOSCOPY (EGD);  Surgeon: Wilford Corner, MD;  Location: [facility];  Service: Endoscopy;  Laterality: N/A;  . I & D EXTREMITY Right 01/29/2018   Procedure: IRRIGATION AND DEBRIDEMENT THUMB;  Surgeon: Dayna Barker, MD;  Location: [facility];  Service: Plastics;  Laterality: Right;  . INCISE AND DRAIN ABCESS    . KIDNEY TRANSPLANT  1991  . KNEE ARTHROSCOPY WITH LATERAL MENISECTOMY Left 12/03/2017   Procedure: LEFT KNEE ARTHROSCOPY WITH LATERAL MENISECTOMY;  Surgeon: Earlie Server, MD;  Location: [facility];  Service: Orthopedics;  Laterality: Left;  . LAPAROSCOPIC GASTRIC BANDING  04/2004; 10/'09, 2/'10   Port Replacement x 2  . LEFT HEART CATH AND CORS/GRAFTS ANGIOGRAPHY N/A 04/21/2018   Procedure: LEFT HEART CATH AND CORS/GRAFTS  ANGIOGRAPHY;  Surgeon: [name], MD;  Location: [facility];  Service: Cardiovascular;  Laterality: N/A;  . LEFT HEART CATHETERIZATION WITH CORONARY/GRAFT ANGIOGRAM N/A 08/23/2013   Procedure: LEFT HEART CATHETERIZATION WITH Beatrix Fetters;  Surgeon: Wellington Hampshire, MD;  Location: [facility] CATH LAB;  Service: Cardiovascular;  Laterality: N/A;  . Lower Extremity Arterial Dopplers  08/2013   ABI: R 0.96, L 1.04  . MULTIPLE TOOTH EXTRACTIONS  age 34  . NM MYOVIEW LTD  03/2016   EF 62%. LOW RISK. C/W prior MI - no Ischemia. Apical hypokinesis.  [name] Kitchen PERCUTANEOUS CORONARY STENT INTERVENTION (PCI-S)  5/'01, 3/'02, 8/'03, 10/'04;   '01 - S660 BMS 2.5 x 9 - dSVG-D1 into D1; '02- post-stent stenosis - 2.5 x 8 Pixel BMS; '8\03: ISR/Thrombosis into native D1 - AngioJet, 2.5 x 13 Pixel; '04 - ISR  95% - covered stented area with Taxus DES 2.5 mm x 20 (2.88)  . PERCUTANEOUS CORONARY STENT INTERVENTION (PCI-S)  08/23/2013   Procedure: PERCUTANEOUS CORONARY STENT INTERVENTION (PCI-S);  Surgeon: Wellington Hampshire, MD;  Location: [facility] CATH LAB;  Service: Cardiovascular;;mid SVG-D1 80%; distal stent ~50% ISR; Promus Prermier DES 2.75 mm xc 20 mm (2.8 mm)  . PORT-A-CATH REMOVAL     kidney  . TRANSTHORACIC ECHOCARDIOGRAM  02/2016   EF 55-60%. Septal dyssynergy from CABG GR 2 DD. Aortic valve is trileaflet the functional bicuspid with sclerosis but not yet notable for stenosis stenosis.; no Significant change  . TUBAL LIGATION    . wrist fistula repair Left    dialysis for one year     reports that she quit smoking about 16 years ago. Her smoking use included cigarettes. She has a 30.00 pack-year smoking history. She has never used smokeless tobacco. She reports that she does not drink alcohol or use drugs.  Allergies  Allergen Reactions  . Tetracycline Hives    Patient tolerated Doxycycline Dec 2020  . Hydromorphone Hcl Nausea And Vomiting  . Niacin Other (See Comments)    Mouth blisters  .  Niaspan [Niacin Er] Other (See Comments)    Mouth blisters  . Sulfa Antibiotics Nausea Only and Other (See Comments)    "Tears up stomach"  . Sulfonamide Derivatives Other (See Comments)    Reaction: per patient "tears her stomach up"  . Codeine Nausea And Vomiting  . Erythromycin Nausea And Vomiting  . Hydromorphone Hcl Nausea And Vomiting  . Morphine And Related Nausea And Vomiting  . Nalbuphine Nausea And Vomiting  . Sulfasalazine Nausea Only and Other (See Comments)    per patient "tears her stomach up", "Tears up stomach"  . Tape Rash and Other (See Comments)    No "plastic" tape," please    Family History  Problem Relation Age of Onset  . Cancer Mother        liver  . Heart disease Father   . Cancer Father        colon  . Arrhythmia Brother        Atrial Fibrillation  . Arrhythmia Paternal Aunt        Atrial Fibrillation     Prior to Admission medications   Medication Sig Start Date End Date Taking? Authorizing Provider  acetaminophen (TYLENOL) 500 MG tablet Take 1 tablet (500 mg total) by mouth every 6 (six) hours as needed for moderate pain or headache. Patient taking differently: Take 1,000 mg by mouth every 6 (six) hours as needed for moderate pain or headache.  10/17/16  Yes Arrien, Jimmy Picket, MD  albuterol (PROVENTIL) (2.5 MG/3ML) 0.083% nebulizer solution Take 3 mLs (2.5 mg total) by nebulization every 6 (six) hours as needed for wheezing or shortness of breath (dx: J45.20). 03/15/19  Yes Parrett, Tammy S, NP  azaTHIOprine (IMURAN) 50 MG tablet Take 125 mg by mouth every evening. Taking 2 & 1/2 tabs (125mg ) qd   Yes [provider]  budesonide (PULMICORT) 0.5 MG/2ML nebulizer solution Take 2 mLs by nebulization 2 (two) times daily. 05/18/19  Yes [provider]  calcitRIOL (ROCALTROL) 0.25 MCG capsule Take 0.25 mcg by mouth every 3 (three) days.    Yes [provider]  carbamazepine (TEGRETOL) 200 MG tablet Take 1 tablet (200 mg total)  by mouth 2 (two) times daily. 07/12/19  Yes Bayard Hugger, NP  clotrimazole (MYCELEX) 10 MG troche Take 1 tablet (10 mg  total) by mouth 5 (five) times daily. 11/28/18  Yes Parrett, Tammy S, NP  cyclobenzaprine (FLEXERIL) 10 MG tablet TAKE 1 TABLET BY MOUTH 3 TIMES DAILY AS NEEDED FOR MUSCLE SPASMS Patient taking differently: Take 10 mg by mouth 2 (two) times daily.  10/19/17  Yes Meredith Staggers, MD  diltiazem (CARDIZEM CD) 120 MG 24 hr capsule TAKE 1 CAPSULE BY MOUTH EVERY DAY Patient taking differently: Take 120 mg by mouth daily.  06/22/19  Yes [name], MD  dofetilide (TIKOSYN) 250 MCG capsule TAKE 1 CAPSULE BY MOUTH 2 (TWO) TIMES DAILY. Patient taking differently: Take 250 mcg by mouth 2 (two) times daily. TAKE 1 CAPSULE BY MOUTH 2 (TWO) TIMES DAILY. 04/18/19  Yes Sherran Needs, NP  DULoxetine (CYMBALTA) 30 MG capsule TAKE 2 CAPSULES (60 MG TOTAL) BY MOUTH EVERY MORNING. Patient taking differently: Take 60 mg by mouth daily.  05/08/19  Yes Bayard Hugger, NP  famotidine (PEPCID) 20 MG tablet Take 20 mg by mouth daily. 05/19/17  Yes [provider]  fluticasone (FLONASE) 50 MCG/ACT nasal spray PLACE 2 SPRAYS INTO BOTH NOSTRILS 2 TIMES DAILY Patient taking differently: Place 2 sprays into both nostrils 2 (two) times daily.  03/22/19  Yes Collene Gobble, MD  glimepiride (AMARYL) 4 MG tablet Take 4 mg by mouth every morning. 04/20/16  Yes [provider]  guaiFENesin (MUCINEX) 600 MG 12 hr tablet Take 1 tablet (600 mg total) by mouth 2 (two) times daily. 02/02/18  Yes Bonnielee Haff, MD  Insulin Glargine (BASAGLAR KWIKPEN) 100 UNIT/ML SOPN Inject 12 Units into the skin daily.   Yes [provider]  LORazepam (ATIVAN) 1 MG tablet Take 1 mg by mouth daily as needed for anxiety. Takes daily 07/06/19  Yes [provider]  metFORMIN (GLUCOPHAGE) 500 MG tablet Take 500 mg by mouth 2 (two) times daily. 10/03/16  Yes [provider]  metoprolol tartrate  (LOPRESSOR) 25 MG tablet TAKE 1 TABLET BY MOUTH TWICE A DAY Patient taking differently: Take 25 mg by mouth 2 (two) times daily.  08/02/19  Yes [name], MD  montelukast (SINGULAIR) 10 MG tablet TAKE 1 TABLET BY MOUTH EVERY DAY Patient taking differently: Take 10 mg by mouth daily. TAKE 1 TABLET BY MOUTH EVERY DAY 05/30/19  Yes Byrum, Rose Fillers, MD  nitroGLYCERIN (NITROSTAT) 0.4 MG SL tablet Place 1 tablet (0.4 mg total) under the tongue every 5 (five) minutes as needed for chest pain. 09/11/13  Yes [name], MD  NOVOLOG FLEXPEN 100 UNIT/ML FlexPen Inject 4 Units into the skin 2 (two) times daily after a meal.  12/13/17  Yes [provider]  Oxycodone HCl 10 MG TABS Take 1 tablet (10 mg total) by mouth every 8 (eight) hours as needed. 08/01/19  Yes Meredith Staggers, MD  predniSONE (DELTASONE) 5 MG tablet Take 5 mg by mouth daily. 06/19/19  Yes [provider]  promethazine (PHENERGAN) 25 MG tablet Take 25 mg by mouth every 6 (six) hours as needed for nausea. Or for sleep 07/14/19  Yes [provider]  rosuvastatin (CRESTOR) 20 MG tablet TAKE 1 TABLET BY MOUTH EVERY DAY Patient taking differently: Take 20 mg by mouth daily.  10/21/18  Yes [name], MD  sertraline (ZOLOFT) 100 MG tablet Take 200 mg by mouth daily. 05/26/19  Yes [provider]  sodium bicarbonate 650 MG tablet Take 1,300 mg by mouth 2 (two) times daily. 06/23/19  Yes [provider]  SYMBICORT 160-4.5 MCG/ACT inhaler TAKE 2 PUFFS BY MOUTH TWICE A DAY Patient taking differently: Inhale 2 puffs into the lungs 2 (two) times daily.  04/10/19  Yes Byrum, Rose Fillers, MD  VENTOLIN HFA 108 (90 Base) MCG/ACT inhaler TAKE 2 PUFFS BY MOUTH EVERY 6 HOURS AS NEEDED FOR WHEEZE OR SHORTNESS OF BREATH Patient taking differently: Inhale 2 puffs into the lungs every 6 (six) hours as needed for wheezing or shortness of breath.  03/15/19  Yes Byrum, Rose Fillers, MD  warfarin (COUMADIN) 10 MG tablet  TAKE 1 TABLET DAILY OR AS DIRECTED BY COUMADIN CLINIC Patient taking differently: Take 10 mg by mouth daily at 6 PM. Take 1 tablet daily or as directed by Coumadin clinic 12/14/18  Yes [name], MD    Physical Exam: Vitals:   08/08/19 1445 08/08/19 1515 08/08/19 1545 08/08/19 1600  BP: 126/76 128/63 (!) 137/50 136/61  Pulse: 82 82 83 82  Resp: 15 (!) 24 (!) 23 (!) 24  Temp:      TempSrc:      SpO2: 98% 96% 95% 100%  Weight:      Height:        Constitutional: NAD, calm, comfortable Vitals:   08/08/19 1445 08/08/19 1515 08/08/19 1545 08/08/19 1600  BP: 126/76 128/63 (!) 137/50 136/61  Pulse: 82 82 83 82  Resp: 15 (!) 24 (!) 23 (!) 24  Temp:      TempSrc:      SpO2: 98% 96% 95% 100%  Weight:      Height:       Eyes: PERRL, lids and conjunctivae normal ENMT: Mucous membranes are moist. Posterior pharynx clear of any exudate or lesions.Normal dentition.  Neck: normal, supple, no masses, no thyromegaly Respiratory: clear to auscultation bilaterally, no wheezing, no crackles. Normal respiratory effort. No accessory muscle use.  Cardiovascular: Regular rate and rhythm, no murmurs / rubs / gallops. No extremity edema. 2+ pedal pulses. No carotid bruits.  Abdomen: no tenderness, no masses palpated. No hepatosplenomegaly. Bowel sounds positive.  Musculoskeletal: no clubbing / cyanosis. No joint deformity upper and lower extremities. Good ROM, no contractures. Normal muscle tone.  Skin: no rashes, lesions, ulcers. No induration Neurologic: CN 2-12 grossly intact. Sensation intact, DTR normal. Strength 5/5 in all 4.  Psychiatric: Normal judgment and insight. Alert and oriented x 3. Normal mood.     Labs on Admission: I have personally reviewed following labs and imaging studies  CBC: Recent Labs  Lab 08/08/19 1108  WBC 11.5*  NEUTROABS 9.5*  HGB 8.9*  HCT 29.8*  MCV 102.1*  PLT 740   Basic Metabolic Panel: Recent Labs  Lab 08/08/19 1108  NA 141  K 4.8  CL 105    CO2 25  GLUCOSE 145*  BUN 12  CREATININE 0.87  CALCIUM 8.9   GFR: Estimated Creatinine Clearance: 75.1 mL/min (by C-G formula based on SCr of 0.87 mg/dL). Liver Function Tests: Recent Labs  Lab 08/08/19 1108  AST 17  ALT 12  ALKPHOS 113  BILITOT 0.4  PROT 5.9*  ALBUMIN 2.6*   No results for input(s): LIPASE, AMYLASE in the last 168 hours. No results for input(s): AMMONIA in the last 168 hours. Coagulation Profile: Recent Labs  Lab 08/08/19 1419  INR >10.0*   Cardiac Enzymes: No results for input(s): CKTOTAL, CKMB, CKMBINDEX, TROPONINI in the last 168 hours. BNP (last 3 results) No results for input(s): PROBNP in the last 8760 hours. HbA1C: No results for input(s): HGBA1C in the  last 72 hours. CBG: No results for input(s): GLUCAP in the last 168 hours. Lipid Profile: No results for input(s): CHOL, HDL, LDLCALC, TRIG, CHOLHDL, LDLDIRECT in the last 72 hours. Thyroid Function Tests: No results for input(s): TSH, T4TOTAL, FREET4, T3FREE, THYROIDAB in the last 72 hours. Anemia Panel: No results for input(s): VITAMINB12, FOLATE, FERRITIN, TIBC, IRON, RETICCTPCT in the last 72 hours. Urine analysis:    Component Value Date/Time   COLORURINE YELLOW 07/23/2019 1231   APPEARANCEUR CLEAR 07/23/2019 1231   LABSPEC 1.019 07/23/2019 1231   PHURINE 6.0 07/23/2019 1231   GLUCOSEU NEGATIVE 07/23/2019 1231   HGBUR NEGATIVE 07/23/2019 1231   BILIRUBINUR NEGATIVE 07/23/2019 1231   KETONESUR NEGATIVE 07/23/2019 1231   PROTEINUR NEGATIVE 07/23/2019 1231   UROBILINOGEN 0.2 10/16/2013 0110   NITRITE NEGATIVE 07/23/2019 1231   LEUKOCYTESUR NEGATIVE 07/23/2019 1231    Radiological Exams on Admission: DG Chest Port 1 View  Result Date: 08/08/2019 CLINICAL DATA:  Recent hospitalization. Recent pneumonia. Shortness of breath today. EXAM: PORTABLE CHEST 1 VIEW COMPARISON:  One-view chest x-ray 07/25/2019 FINDINGS: The heart is enlarged. Patient is status post median sternotomy for  CABG. Sternal wires are broken, stable. Mild interstitial coarsening is present without focal airspace consolidation. Aeration is improved since the most recent exam. There are no significant effusions. IMPRESSION: 1. Stable cardiomegaly without failure. 2. Mild interstitial coarsening is likely chronic. 3. No focal airspace disease. Electronically Signed   By: San Morelle M.D.   On: 08/08/2019 11:53    EKG: Ordered  Assessment/Plan Active Problems:   Healthcare-associated pneumonia   HCAP (healthcare-associated pneumonia)  HCAP, has worsening cough, Fever at home, with elevation of white count compared to discharge, and new onset of hypoxia, w with her prolonged history of immune suppressants status, suspecting healthcare for pneumonia.  Send sputum culture continue vancomycin and cefepime for now, trend WBC.  Send procalcitonin.  Acute hypoxic respite failure, no significant infiltrates, COVID 19 need to be ruled out, rapid test came back negative and confirmation test was sent also.  D-dimer negative, low suspicion for PE.  Her COPD seems fairly controlled with no audible wheezing.  COPD without significant signs of exacerbation continue home meds.  Paroxysmal A. Fib, controlled continue home rate control medications including Tikosyn and beta-blocker, Cardizem. ON coumadin/  IDDM, will hold her p.o. diabetes medications in case patient will need IV contrast, continue home insulin regimen.  Kidney transplant, no severe infection/sepsis, will continue immunosuppression.     DVT prophylaxis: Coumadin Code Status: Full Family Communication: None at bedside Disposition Plan: TBD Consults called: None Admission status: Tele admit   Lequita Halt MD [facility] Pager [phone]  If 7PM-7AM, please contact night-coverage [email or web address] Password [facility]  08/08/2019, 4:45 PM

## 2019-08-08 NOTE — ED Provider Notes (Signed)
[place] EMERGENCY DEPARTMENT Provider Note   CSN: [id number] Arrival date & time: 08/08/19  1007     History Chief Complaint  Patient presents with  . covid sx  . Cough  . Fever    Katherine [name] is a 63 y.o. female.  63 year old female with complex medical history including kidney transplant, asthma, COPD, A. fib (on Coumadin) presents with complaint of progressively worsening shortness of breath, cough productive with brown and green sputum, dry heaves (is able to tolerate p.o. fluids and solid foods), fever (max temp 99.7).  Patient states that she was recently discharged from the hospital, admitted December 6-11 for pneumonia, states she was feeling better when she went home and continued on her antibiotics however now feels like she is feeling worse.  Patient last had a nebulizer treatment at 3:00 this morning.  Patient states if she ambulates at home her O2 sat is in the 70s and 80s.        Past Medical History:  Diagnosis Date  . Anemia   . Anxiety   . Bilateral carotid artery stenosis    Carotid duplex 0/4888: 9-16% LICA, 94-50% RICA, >38% RECA, f/u 1 yr suggested  . CAD (coronary artery disease) of bypass graft 5/01; 3/'02, 8/'03, 10/'04; 1/15   PCI x 5 to SVG-D1   . CAD in native artery 07/1993    3 Vessel Disease (LAD-D1 & RCA) -- CABG  . CAD S/P percutaneous coronary angioplasty    PCI to SVG-D1 insertion/native D1 x 4 = '01 -(S660 BMS 2.5 x 9 - insertion into D1; '02 - distal overlap ACS Pixel 2.5 x 8  BMS; '03 distal/native ISR/Thrombosis - Pixel 2.5 x 13; '04 - ISR-  Taxus 2.5 x 20 (covered all);; 1/15 - mid SVG-D1 (50% distal ISR) - Promus P 2.75 x 20 -- 2.8 mm  . COPD mixed type ([place])    Followed by Dr. Lamonte Sakai "pulmonologist said no COPD"  . Depression   . Depression with anxiety   . Diabetes mellitus type 2 in obese ([place])   . Diarrhea    started after cholecystectomy and mass removed from intestine  . Dyslipidemia, goal LDL below  70    08/2012: TC 137, TG 200, HDL 32!, LDL 45; on statin (followed by Dr.Deterding)  . ESRD (end stage renal disease) ([place]) 1991   s/p Cadaveric Renal Transplant [facility] - Dr. Jimmy Footman)   . Family history of adverse reaction to anesthesia    mom's bp dropped during/after anesthesia  . Fibromyalgia   . GERD (gastroesophageal reflux disease)   . Glomerulonephritis, chronic, rapidly progressive 35  . 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 07/1993   Rescue PTCA of RCA -- referred for CABG.  . H/O: GI bleed   . Headache    migraines in the past  . History of kidney stones   . Hypertension associated with diabetes ([place])   . Morbid obesity ([place])   . MRSA (methicillin resistant staph aureus) culture positive   . OSA (obstructive sleep apnea)    no longer on CPAP or home O2, states she doesn't need now after lap band  . PAD (peripheral artery disease) ([place]) 08/2013   LEA Dopplers to be read by Dr. Fletcher Anon  . PAF (paroxysmal atrial fibrillation) ([place]) 06/2014   Noted on CardioNet Monitor  - rate ~112; no recurrent symptoms. Nonionic regulation because of frequent GI procedures. Patient prefers Plavix  . Pneumonia   . Recurrent  boils    Bilateral Groin  . Rheumatoid arthritis ([place])    Per Patient Report; associated with OA  . 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 Servando Snare: LIMA-LAD, SVG-bifurcatingD1, SVG-rPDA  . S/p cadaver renal transplant [place]  . Stroke [facility]) 2012   "right eye stroke- half blind now"  . Unstable angina ([place]) 5/01; 3/'02, 8/'03, 10/'04; 1/15   x 5 occurences since Inf-Post STEMI in 1994    Patient Active Problem List   Diagnosis Date Noted  . Acute respiratory failure with hypoxia ([place]) 07/25/2019  . Asthma exacerbation 07/23/2019  . Current chronic use of systemic steroids 07/23/2019  . OSA (obstructive sleep apnea)   . Severe episode of recurrent major depressive disorder, with psychotic features ([place]) 02/16/2019  . Asthma with  bronchitis 12/16/2018  . Obstructive chronic bronchitis without exacerbation ([place]) 11/03/2018  . Long term (current) use of anticoagulants 04/28/2018  . S/P kidney transplant 01/28/2018  . Primary osteoarthritis of right knee 08/18/2017  . Aortic valve sclerosis 01/28/2017  . 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 Fibromyalgia 03/30/2016  . Other spondylosis with radiculopathy, lumbar region 03/30/2016  . Type 2 diabetes mellitus with peripheral neuropathy ([place]) 03/30/2016  . Paroxysmal atrial fibrillation ([place]) 06/28/2014  . Hypertension associated with diabetes ([place])   . PAD (peripheral artery disease) ([place]) 08/17/2013  . Stenosis of right carotid artery without infarction 10/08/2012  . Dyslipidemia, goal LDL below 70 10/08/2012    Class: Diagnosis of  . Mitral annular calcification 10/08/2012  . Renal transplant disorder 06/12/2012  . Chronic allergic rhinitis 04/29/2011  . Extrinsic asthma 09/09/2007  . GERD 09/09/2007  . COUGH, CHRONIC 09/09/2007  . Morbid obesity - s/p Lap Band 9/'05 05/07/2004    Class: Diagnosis of  . S/P CABG (coronary artery bypass graft) x 3 09/07/1993    Class: History of  . ESRD (end stage renal disease) ([place]) 1991    Past Surgical History:  Procedure Laterality Date  . ABDOMINAL AORTOGRAM N/A 04/21/2018   Procedure: ABDOMINAL AORTOGRAM;  Surgeon: [name], MD;  Location: [place] CV LAB;  Service: Cardiovascular;  Laterality: N/A;  . CARDIAC CATHETERIZATION  5/'01, 3/'02, 8/'03, 10/'04; 1/'15   08/22/2013: LAD & RCA 100%; LIMA-LAD & SVG-rPDA patent; Cx-- OM1 60%, OM2 ostial ~50%; SVG-D1 - 80% mid, 50% distal ISR --PCI  . CATHETER REMOVAL    . CHOLECYSTECTOMY N/A 10/29/2014   Procedure: LAPAROSCOPIC CHOLECYSTECTOMY WITH INTRAOPERATIVE CHOLANGIOGRAM;  Surgeon: Excell Seltzer, MD;  Location: [facility];  Service: General;  Laterality: N/A;  . CORONARY ANGIOPLASTY  1994   x5  . CORONARY ARTERY BYPASS GRAFT  1995   LIMA-LAD, SVG-RPDA, SVG-D1  .  ESOPHAGOGASTRODUODENOSCOPY N/A 10/15/2016   Procedure: ESOPHAGOGASTRODUODENOSCOPY (EGD);  Surgeon: Wilford Corner, MD;  Location: [facility] ENDOSCOPY;  Service: Endoscopy;  Laterality: N/A;  . I & D EXTREMITY Right 01/29/2018   Procedure: IRRIGATION AND DEBRIDEMENT THUMB;  Surgeon: Dayna Barker, MD;  Location: [place];  Service: Plastics;  Laterality: Right;  . INCISE AND DRAIN ABCESS    . KIDNEY TRANSPLANT  1991  . KNEE ARTHROSCOPY WITH LATERAL MENISECTOMY Left 12/03/2017   Procedure: LEFT KNEE ARTHROSCOPY WITH LATERAL MENISECTOMY;  Surgeon: Earlie Server, MD;  Location: [place];  Service: Orthopedics;  Laterality: Left;  . LAPAROSCOPIC GASTRIC BANDING  04/2004; 10/'09, 2/'10   Port Replacement x 2  . LEFT HEART CATH AND CORS/GRAFTS ANGIOGRAPHY N/A 04/21/2018   Procedure: LEFT HEART CATH AND CORS/GRAFTS ANGIOGRAPHY;  Surgeon: [name],  MD;  Location: [place] CV LAB;  Service: Cardiovascular;  Laterality: N/A;  . LEFT HEART CATHETERIZATION WITH CORONARY/GRAFT ANGIOGRAM N/A 08/23/2013   Procedure: LEFT HEART CATHETERIZATION WITH Beatrix Fetters;  Surgeon: Wellington Hampshire, MD;  Location: [place] CATH LAB;  Service: Cardiovascular;  Laterality: N/A;  . Lower Extremity Arterial Dopplers  08/2013   ABI: R 0.96, L 1.04  . MULTIPLE TOOTH EXTRACTIONS  age 80  . NM MYOVIEW LTD  03/2016   EF 62%. LOW RISK. C/W prior MI - no Ischemia. Apical hypokinesis.  [name] Kitchen PERCUTANEOUS CORONARY STENT INTERVENTION (PCI-S)  5/'01, 3/'02, 8/'03, 10/'04;   '01 - S660 BMS 2.5 x 9 - dSVG-D1 into D1; '02- post-stent stenosis - 2.5 x 8 Pixel BMS; '8\03: ISR/Thrombosis into native D1 - AngioJet, 2.5 x 13 Pixel; '04 - ISR 95% - covered stented area with Taxus DES 2.5 mm x 20 (2.88)  . PERCUTANEOUS CORONARY STENT INTERVENTION (PCI-S)  08/23/2013   Procedure: PERCUTANEOUS CORONARY STENT INTERVENTION (PCI-S);  Surgeon: Wellington Hampshire, MD;  Location: [facility] CATH LAB;  Service: Cardiovascular;;mid SVG-D1 80%; distal stent ~50% ISR;  Promus Prermier DES 2.75 mm xc 20 mm (2.8 mm)  . PORT-A-CATH REMOVAL     kidney  . TRANSTHORACIC ECHOCARDIOGRAM  02/2016   EF 55-60%. Septal dyssynergy from CABG GR 2 DD. Aortic valve is trileaflet the functional bicuspid with sclerosis but not yet notable for stenosis stenosis.; no Significant change  . TUBAL LIGATION    . wrist fistula repair Left    dialysis for one year     OB History   No obstetric history on file.     Family History  Problem Relation Age of Onset  . Cancer Mother        liver  . Heart disease Father   . Cancer Father        colon  . Arrhythmia Brother        Atrial Fibrillation  . Arrhythmia Paternal Aunt        Atrial Fibrillation    Social History   Tobacco Use  . Smoking status: Former Smoker    Packs/day: 1.00    Years: 30.00    Pack years: 30.00    Types: Cigarettes    Quit date: 08/17/2002    Years since quitting: 16.9  . Smokeless tobacco: Never Used  Substance Use Topics  . Alcohol use: No  . Drug use: No    Home Medications Prior to Admission medications   Medication Sig Start Date End Date Taking? Authorizing Provider  acetaminophen (TYLENOL) 500 MG tablet Take 1 tablet (500 mg total) by mouth every 6 (six) hours as needed for moderate pain or headache. Patient taking differently: Take 1,000 mg by mouth every 6 (six) hours as needed for moderate pain or headache.  10/17/16  Yes Arrien, Jimmy Picket, MD  albuterol (PROVENTIL) (2.5 MG/3ML) 0.083% nebulizer solution Take 3 mLs (2.5 mg total) by nebulization every 6 (six) hours as needed for wheezing or shortness of breath (dx: J45.20). 03/15/19  Yes Parrett, Tammy S, NP  azaTHIOprine (IMURAN) 50 MG tablet Take 125 mg by mouth every evening. Taking 2 & 1/2 tabs (125mg ) qd   Yes [provider]  budesonide (PULMICORT) 0.5 MG/2ML nebulizer solution Take 2 mLs by nebulization 2 (two) times daily. 05/18/19  Yes [provider]  calcitRIOL (ROCALTROL) 0.25 MCG capsule Take  0.25 mcg by mouth every 3 (three) days.    Yes [provider]  carbamazepine (TEGRETOL)  200 MG tablet Take 1 tablet (200 mg total) by mouth 2 (two) times daily. 07/12/19  Yes Bayard Hugger, NP  clotrimazole (MYCELEX) 10 MG troche Take 1 tablet (10 mg total) by mouth 5 (five) times daily. 11/28/18  Yes Parrett, Tammy S, NP  cyclobenzaprine (FLEXERIL) 10 MG tablet TAKE 1 TABLET BY MOUTH 3 TIMES DAILY AS NEEDED FOR MUSCLE SPASMS Patient taking differently: Take 10 mg by mouth 2 (two) times daily.  10/19/17  Yes Meredith Staggers, MD  diltiazem (CARDIZEM CD) 120 MG 24 hr capsule TAKE 1 CAPSULE BY MOUTH EVERY DAY Patient taking differently: Take 120 mg by mouth daily.  06/22/19  Yes [name], MD  dofetilide (TIKOSYN) 250 MCG capsule TAKE 1 CAPSULE BY MOUTH 2 (TWO) TIMES DAILY. Patient taking differently: Take 250 mcg by mouth 2 (two) times daily. TAKE 1 CAPSULE BY MOUTH 2 (TWO) TIMES DAILY. 04/18/19  Yes Sherran Needs, NP  DULoxetine (CYMBALTA) 30 MG capsule TAKE 2 CAPSULES (60 MG TOTAL) BY MOUTH EVERY MORNING. Patient taking differently: Take 60 mg by mouth daily.  05/08/19  Yes Bayard Hugger, NP  famotidine (PEPCID) 20 MG tablet Take 20 mg by mouth daily. 05/19/17  Yes [provider]  fluticasone (FLONASE) 50 MCG/ACT nasal spray PLACE 2 SPRAYS INTO BOTH NOSTRILS 2 TIMES DAILY Patient taking differently: Place 2 sprays into both nostrils 2 (two) times daily.  03/22/19  Yes Collene Gobble, MD  glimepiride (AMARYL) 4 MG tablet Take 4 mg by mouth every morning. 04/20/16  Yes [provider]  guaiFENesin (MUCINEX) 600 MG 12 hr tablet Take 1 tablet (600 mg total) by mouth 2 (two) times daily. 02/02/18  Yes Bonnielee Haff, MD  Insulin Glargine (BASAGLAR KWIKPEN) 100 UNIT/ML SOPN Inject 12 Units into the skin daily.   Yes [provider]  LORazepam (ATIVAN) 1 MG tablet Take 1 mg by mouth daily as needed for anxiety. Takes daily 07/06/19  Yes [provider]   metFORMIN (GLUCOPHAGE) 500 MG tablet Take 500 mg by mouth 2 (two) times daily. 10/03/16  Yes [provider]  metoprolol tartrate (LOPRESSOR) 25 MG tablet TAKE 1 TABLET BY MOUTH TWICE A DAY Patient taking differently: Take 25 mg by mouth 2 (two) times daily.  08/02/19  Yes [name], MD  montelukast (SINGULAIR) 10 MG tablet TAKE 1 TABLET BY MOUTH EVERY DAY Patient taking differently: Take 10 mg by mouth daily. TAKE 1 TABLET BY MOUTH EVERY DAY 05/30/19  Yes Byrum, Rose Fillers, MD  nitroGLYCERIN (NITROSTAT) 0.4 MG SL tablet Place 1 tablet (0.4 mg total) under the tongue every 5 (five) minutes as needed for chest pain. 09/11/13  Yes [name], MD  NOVOLOG FLEXPEN 100 UNIT/ML FlexPen Inject 4 Units into the skin 2 (two) times daily after a meal.  12/13/17  Yes [provider]  Oxycodone HCl 10 MG TABS Take 1 tablet (10 mg total) by mouth every 8 (eight) hours as needed. 08/01/19  Yes Meredith Staggers, MD  predniSONE (DELTASONE) 5 MG tablet Take 5 mg by mouth daily. 06/19/19  Yes [provider]  promethazine (PHENERGAN) 25 MG tablet Take 25 mg by mouth every 6 (six) hours as needed for nausea. Or for sleep 07/14/19  Yes [provider]  rosuvastatin (CRESTOR) 20 MG tablet TAKE 1 TABLET BY MOUTH EVERY DAY Patient taking differently: Take 20 mg by mouth daily.  10/21/18  Yes [name], MD  sertraline (ZOLOFT) 100 MG tablet  Take 200 mg by mouth daily. 05/26/19  Yes [provider]  sodium bicarbonate 650 MG tablet Take 1,300 mg by mouth 2 (two) times daily. 06/23/19  Yes [provider]  SYMBICORT 160-4.5 MCG/ACT inhaler TAKE 2 PUFFS BY MOUTH TWICE A DAY Patient taking differently: Inhale 2 puffs into the lungs 2 (two) times daily.  04/10/19  Yes Byrum, Rose Fillers, MD  VENTOLIN HFA 108 (90 Base) MCG/ACT inhaler TAKE 2 PUFFS BY MOUTH EVERY 6 HOURS AS NEEDED FOR WHEEZE OR SHORTNESS OF BREATH Patient taking differently: Inhale 2 puffs into the  lungs every 6 (six) hours as needed for wheezing or shortness of breath.  03/15/19  Yes Byrum, Rose Fillers, MD  warfarin (COUMADIN) 10 MG tablet TAKE 1 TABLET DAILY OR AS DIRECTED BY COUMADIN CLINIC Patient taking differently: Take 10 mg by mouth daily at 6 PM. Take 1 tablet daily or as directed by Coumadin clinic 12/14/18  Yes [name], MD    Allergies    Tetracycline, Hydromorphone hcl, Niacin, Niaspan [niacin er], Sulfa antibiotics, Sulfonamide derivatives, Codeine, Erythromycin, Hydromorphone hcl, Morphine and related, Nalbuphine, Sulfasalazine, and Tape  Review of Systems   Review of Systems  Constitutional: Positive for fatigue and fever.  Respiratory: Positive for cough and shortness of breath.   Cardiovascular: Negative for chest pain.  Gastrointestinal: Positive for nausea and vomiting. Negative for abdominal pain, constipation and diarrhea.  Genitourinary: Negative for dysuria and frequency.  Musculoskeletal: Negative for arthralgias and myalgias.  Skin: Negative for rash and wound.  Allergic/Immunologic: Positive for immunocompromised state.  Neurological: Positive for weakness.  Hematological: Bruises/bleeds easily.  Psychiatric/Behavioral: Negative for confusion.  All other systems reviewed and are negative.   Physical Exam Updated Vital Signs BP 126/76   Pulse 82   Temp 99.7 F (37.6 C) (Oral)   Resp 15   Ht 5\' 1"  (1.549 m)   Wt 105.7 kg   SpO2 98%   BMI 44.02 kg/m   Physical Exam Vitals and nursing note reviewed.  Constitutional:      General: She is not in acute distress.    Appearance: She is well-developed. She is obese. She is not diaphoretic.  HENT:     Head: Normocephalic and atraumatic.  Cardiovascular:     Rate and Rhythm: Normal rate and regular rhythm.     Pulses: Normal pulses.     Heart sounds: Normal heart sounds.  Pulmonary:     Effort: Pulmonary effort is normal.     Breath sounds: Normal breath sounds.  Abdominal:     Palpations:  Abdomen is soft.     Tenderness: There is no abdominal tenderness.  Skin:    General: Skin is warm and dry.     Findings: No erythema or rash.  Neurological:     Mental Status: She is alert and oriented to person, place, and time.  Psychiatric:        Behavior: Behavior normal.     ED Results / Procedures / Treatments   Labs (all labs ordered are listed, but only abnormal results are displayed) Labs Reviewed  COMPREHENSIVE METABOLIC PANEL - Abnormal; Notable for the following components:      Result Value   Glucose, Bld 145 (*)    Total Protein 5.9 (*)    Albumin 2.6 (*)    All other components within normal limits  CBC WITH DIFFERENTIAL/PLATELET - Abnormal; Notable for the following components:   WBC 11.5 (*)    RBC 2.92 (*)  Hemoglobin 8.9 (*)    HCT 29.8 (*)    MCV 102.1 (*)    MCHC 29.9 (*)    RDW 17.0 (*)    Neutro Abs 9.5 (*)    All other components within normal limits  LACTIC ACID, PLASMA - Abnormal; Notable for the following components:   Lactic Acid, Venous 2.7 (*)    All other components within normal limits  LACTIC ACID, PLASMA - Abnormal; Notable for the following components:   Lactic Acid, Venous 2.2 (*)    All other components within normal limits  CULTURE, BLOOD (ROUTINE X 2)  CULTURE, BLOOD (ROUTINE X 2)  SARS CORONAVIRUS 2 (TAT 6-24 HRS)  URINALYSIS, ROUTINE W REFLEX MICROSCOPIC  PROTIME-INR  D-DIMER, QUANTITATIVE (NOT AT [facility])  POC SARS CORONAVIRUS 2 AG -  ED    EKG None  Radiology DG Chest Port 1 View  Result Date: 08/08/2019 CLINICAL DATA:  Recent hospitalization. Recent pneumonia. Shortness of breath today. EXAM: PORTABLE CHEST 1 VIEW COMPARISON:  One-view chest x-ray 07/25/2019 FINDINGS: The heart is enlarged. Patient is status post median sternotomy for CABG. Sternal wires are broken, stable. Mild interstitial coarsening is present without focal airspace consolidation. Aeration is improved since the most recent exam. There are no  significant effusions. IMPRESSION: 1. Stable cardiomegaly without failure. 2. Mild interstitial coarsening is likely chronic. 3. No focal airspace disease. Electronically Signed   By: San Morelle M.D.   On: 08/08/2019 11:53    Procedures Procedures (including critical care time)  Medications Ordered in ED Medications  sodium chloride 0.9 % bolus 500 mL (has no administration in time range)  vancomycin (VANCOREADY) IVPB 1750 mg/350 mL (has no administration in time range)  sodium chloride 0.9 % bolus 500 mL (500 mLs Intravenous New Bag/Given 08/08/19 1433)  ceFEPIme (MAXIPIME) 1 g in sodium chloride 0.9 % 100 mL IVPB (1 g Intravenous New Bag/Given 08/08/19 1433)    ED Course  I have reviewed the triage vital signs and the nursing notes.  Pertinent labs & imaging results that were available during my care of the patient were reviewed by me and considered in my medical decision making (see chart for details).  Clinical Course as of Aug 07 1510  Tue Aug 07, 4362  [id number] 63 year old female with multiple medical problems including COPD and renal transplant, on Coumadin with history of A. fib presents with complaint of shortness of breath, worsening cough, concern for fever and reports O2 sats dropping into the 70s and 80s with activity.  She was recently mated to the hospital December 6 through the 11th with pneumonia, was discharged home and felt she was doing better until 4 days ago.  Patient was given antibiotics to cover HCAP.  Lactic acid returns and is elevated at 2.7, repeat lactic is 2.2.  CBC with mild leukocytosis at 11.5, increased since her last CBC on file.  CMP without significant changes.  Rapid Covid test is negative, PCR pending.  Patient was given antibiotics and labs per sepsis protocol, fluid restricted due to reduced EF. INR and D-dimer pending, consider possible PE with her hypoxemia. Case discussed with Dr. Roosevelt Locks with [facility] hospitalist who will consult for admission.    [LM]    Clinical Course User Index [LM] Roque Lias   MDM Rules/Calculators/A&P                      Final Clinical Impression(s) / ED Diagnoses Final diagnoses:  Shortness of breath  Hypoxemia  Lactic acidosis    Rx / DC Orders ED Discharge Orders    None       Roque Lias 08/08/19 1512    Quintella Reichert, MD 08/09/19 [phone]

## 2019-08-08 NOTE — Progress Notes (Signed)
Pharmacy Antibiotic Note  Katherine [name] is a 63 y.o. female admitted on 08/08/2019 with pneumonia.  Pharmacy has been consulted for Vancomycin dosing.  Vancomycin 1750 mg IV Q 24 hrs. Goal AUC 400-550. Expected AUC: 496 SCr used: 0.87  Plan: Vancomycin 1750 mg IV q24hr Monitor renal function, clinical status, C&S and vanc levels as indicated.  Height: 5\' 1"  (154.9 cm) Weight: 233 lb (105.7 kg) IBW/kg (Calculated) : 47.8  Temp (24hrs), Avg:99.7 F (37.6 C), Min:99.7 F (37.6 C), Max:99.7 F (37.6 C)  Recent Labs  Lab 08/08/19 1108  WBC 11.5*  CREATININE 0.87  LATICACIDVEN 2.7*    Estimated Creatinine Clearance: 75.1 mL/min (by C-G formula based on SCr of 0.87 mg/dL).    Allergies  Allergen Reactions  . Tetracycline Hives    Patient tolerated Doxycycline Dec 2020  . Hydromorphone Hcl Nausea And Vomiting  . Niacin Other (See Comments)    Mouth blisters  . Niaspan [Niacin Er] Other (See Comments)    Mouth blisters  . Sulfa Antibiotics Nausea Only and Other (See Comments)    "Tears up stomach"  . Sulfonamide Derivatives Other (See Comments)    Reaction: per patient "tears her stomach up"  . Codeine Nausea And Vomiting  . Erythromycin Nausea And Vomiting  . Hydromorphone Hcl Nausea And Vomiting  . Morphine And Related Nausea And Vomiting  . Nalbuphine Nausea And Vomiting  . Sulfasalazine Nausea Only and Other (See Comments)    per patient "tears her stomach up", "Tears up stomach"  . Tape Rash and Other (See Comments)    No "plastic" tape," please    Antimicrobials this admission: Vanc 12/22 >>  Cefepime 12/22 >>   Thank you for allowing pharmacy to be a part of this patient's care.  Alanda Slim, PharmD, [facility] Clinical Pharmacist Please see AMION for all Pharmacists' Contact Phone Numbers 08/08/2019, 12:51 PM

## 2019-08-08 NOTE — ED Triage Notes (Signed)
To ED from home with c/o ongoing shortness of breath- recent dx of pneumonia with admission- d/c'd 11 days ago-- has continued to cough and need breathing txs at home-- with low grade temp.  Shortness of breath with any movement--  Hx of kidney transplant in '91

## 2019-08-08 NOTE — ED Notes (Signed)
IV team at bedside 

## 2019-08-08 NOTE — ED Notes (Signed)
Pt given ice chips

## 2019-08-08 NOTE — ED Notes (Signed)
Pt states that she "doesn't feel well enough to walk right now." Sitting on the side of the bed comfortably. O2 sats in the 76-85% after pulling herself up in bed.

## 2019-08-09 DIAGNOSIS — A419 Sepsis, unspecified organism: Secondary | ICD-10-CM

## 2019-08-09 DIAGNOSIS — J189 Pneumonia, unspecified organism: Secondary | ICD-10-CM

## 2019-08-09 LAB — GLUCOSE, CAPILLARY
Glucose-Capillary: 93 mg/dL (ref 70–99)
Glucose-Capillary: 139 mg/dL — ABNORMAL HIGH (ref 70–99)
Glucose-Capillary: 107 mg/dL — ABNORMAL HIGH (ref 70–99)
Glucose-Capillary: 160 mg/dL — ABNORMAL HIGH (ref 70–99)
Glucose-Capillary: 111 mg/dL — ABNORMAL HIGH (ref 70–99)

## 2019-08-09 LAB — BLOOD GAS, ARTERIAL
Acid-Base Excess: 2.9 mmol/L — ABNORMAL HIGH (ref 0.0–2.0)
Bicarbonate: 26.5 mmol/L (ref 20.0–28.0)
Drawn by: 24513
FIO2: 28
O2 Saturation: 99.5 %
Patient temperature: 37.5
pCO2 arterial: 39 mmHg (ref 32.0–48.0)
pH, Arterial: 7.45 (ref 7.350–7.450)
pO2, Arterial: 157 mmHg — ABNORMAL HIGH (ref 83.0–108.0)

## 2019-08-09 LAB — C-REACTIVE PROTEIN: CRP: 6.5 mg/dL — ABNORMAL HIGH (ref <1.0)

## 2019-08-09 LAB — CBC
HCT: 27 % — ABNORMAL LOW (ref 36.0–46.0)
Hemoglobin: 8.3 g/dL — ABNORMAL LOW (ref 12.0–15.0)
MCH: 30.9 pg (ref 26.0–34.0)
MCHC: 30.7 g/dL (ref 30.0–36.0)
MCV: 100.4 fL — ABNORMAL HIGH (ref 80.0–100.0)
Platelets: 290 10*3/uL (ref 150–400)
RBC: 2.69 MIL/uL — ABNORMAL LOW (ref 3.87–5.11)
RDW: 17.5 % — ABNORMAL HIGH (ref 11.5–15.5)
WBC: 8.5 10*3/uL (ref 4.0–10.5)
nRBC: 0.4 % — ABNORMAL HIGH (ref 0.0–0.2)

## 2019-08-09 LAB — PROTIME-INR
INR: >10 (ref 0.8–1.2)
Prothrombin Time: >90 seconds — ABNORMAL HIGH (ref 11.4–15.2)

## 2019-08-09 LAB — MRSA PCR SCREENING: MRSA by PCR: NEGATIVE

## 2019-08-09 LAB — BASIC METABOLIC PANEL
Anion gap: 11 (ref 5–15)
BUN: 12 mg/dL (ref 8–23)
CO2: 23 mmol/L (ref 22–32)
Calcium: 8.5 mg/dL — ABNORMAL LOW (ref 8.9–10.3)
Chloride: 105 mmol/L (ref 98–111)
Creatinine, Ser: 0.86 mg/dL (ref 0.44–1.00)
GFR calc Af Amer: >60 mL/min (ref >60)
GFR calc non Af Amer: >60 mL/min (ref >60)
Glucose, Bld: 130 mg/dL — ABNORMAL HIGH (ref 70–99)
Potassium: 4.2 mmol/L (ref 3.5–5.1)
Sodium: 139 mmol/L (ref 135–145)

## 2019-08-09 LAB — D-DIMER, QUANTITATIVE: D-Dimer, Quant: 0.27 ug/mL-FEU (ref 0.00–0.50)

## 2019-08-09 LAB — FERRITIN: Ferritin: 22 ng/mL (ref 11–307)

## 2019-08-09 MED ORDER — SODIUM CHLORIDE 0.9 % IV SOLN
2.0000 g | Freq: Three times a day (TID) | INTRAVENOUS | Status: DC
  Administered 2019-08-09 – 2019-08-13 (×13): 2 g via INTRAVENOUS
  Filled 2019-08-09 (×16): qty 2

## 2019-08-09 MED ORDER — VITAMIN K1 10 MG/ML IJ SOLN
2.5000 mg | Freq: Once | INTRAVENOUS | Status: AC
  Administered 2019-08-09: 2.5 mg via INTRAVENOUS
  Filled 2019-08-09: qty 0.25

## 2019-08-09 NOTE — Consult Note (Addendum)
   [facility] CM Inpatient Consult   08/09/2019  Kerly Rigsbee 1955-08-31 [id number]  Patient screened for high risk score for unplanned readmission score and for less than 30 days hospitalization to check if potential [place] [facility] services in the Medicare Next GEN Accountable Care Organization [ACO].  Review of patient's medical record reveals patient is admitted with shortness of breath and weakness and with acute hypoxia per MD notes for History and Physical.. Admitted with Hospital Acquired Pneumonia [08/10/2019 0835 error in wording] per MD notes from History and Physical.  08/10/2019 0835 correction in wording: HCAP [Healthcare-associated pneumonia]  Primary Care Provider is Scarlette Calico, MD with [facility].  This provider office is listed to do the transition of care [TOC].  [facility]  Plan:  Follow up with inpatient [facility] team for disposition and needs.  Continue to follow progress and disposition to assess for post hospital care management needs.    Please place a [facility] Care Management consult as appropriate and for questions contact:   Natividad Brood, RN BSN [facility] Liaison  [phone] business mobile phone Toll free office [phone]  Fax number: [phone] [name]

## 2019-08-09 NOTE — Progress Notes (Signed)
0200 - Pt complaining of being unable to breathe. O2 sats showed 95%. Vitals stable. Placed on 2L nasal cannula for comfort. MD paged. Will monitor.

## 2019-08-09 NOTE — Progress Notes (Addendum)
PROGRESS NOTE    Katherine [name]  DXA:[id number] DOB: [birth year] DOA: 08/08/2019 PCP: [name], MD   Brief Narrative:  Katherine [name] is a 63 y.o. 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  Patient was hospitalized and discharged 10 days ago with 1 episode of atypical pneumonia, and was discharged with p.o. doxycycline and Augmentin.  However patient said she never felt she fully recovered from that episode of pneumonia, and her symptoms has returned just in the couple days after discharge and started to get worse.  She also called while green-yellowish sputum moderate amount, denied any night sweats, denied any Covid contact after discharge.She denies any CP.  temperatures as high as 99.2, chills without sweats. She denies exposure to COVID-19. She states she felt this way once before when she was septic from a finger infection which she thinks is doing okay now. 05/2019 - just finished abx for a UTI the last several weeks which has been on doxy, then ciproand these symptoms have resolved. In ED: Temperature 99.7 in the ED, O2 saturation dropped with minimum activity, she was stabilized on 2 L at rest, x-ray showed chronic interstitial changes no acute infiltrates   Subjective: No acute issues or events overnight, feeling somewhat improved oxygen with no acute chest pain, nausea, vomiting, diarrhea, constipation, headache, fevers, chills.    Assessment & Plan:   Principal Problem:   HCAP (healthcare-associated pneumonia) Active Problems:   S/P kidney transplant   GERD   Morbid obesity - s/p Lap Band 9/'05   S/P CABG (coronary artery bypass graft) x 3   Dyslipidemia, goal LDL below 70   Acute  respiratory failure with hypoxia and hypercapnia (HCC)   Sepsis ([place])   Acute hypoxic respiratory failure in the setting of HCAP, patient does meet sepsis criteria, POA,  Previous admitted to the hospital with worsening cough, fever and likely failure of outpatient antibiotics -Continue vancomycin and cefepime for now -MRSA nares pending, if negative would discontinue vancomycin given patient's renal transplant -COVID-19 negative -D-dimer negative -Chest x-ray personally reviewed, right greater than left bibasilar opacifications concerning for pneumonia, moderately improved since previous admission but appears not yet resolved  Paroxysmal A. Fib, -Rate controlled -Continue tikosyn and beta-blocker, Cardizem -Coumadin on hold given INR: Lab Results  Component Value Date   INR >10.0 (HH) 08/09/2019   INR >10.0 (HH) 08/08/2019   INR 3.3 (H) 07/28/2019     IDDM, well controlled A1C 6.6 -Continue to hold her p.o. diabetes medications -Continue home insulin regimen.  Kidney transplant -Continue home meds -If worsening condition will contact transplant specialist and discuss prudence of holding meds  DVT prophylaxis: Coumadin(on hold currently supratherapeutic) Code Status: Full Family Communication: None available Disposition Plan: Likely home pending clinical status Consults called: None Admission status: Inpatient, tele  Objective: Vitals:   08/09/19 0449 08/09/19 0823 08/09/19 1130 08/09/19 1148  BP: 134/67 129/72 (!) 123/55   Pulse: 68 82 61 64  Resp: 20 18  18   Temp: 98.6 F (37 C) 97.9 F (36.6 C) 99.4 F (37.4 C)   TempSrc: Oral Oral Oral   SpO2: 96% 100% 99% 98%  Weight: 106.1 kg     Height:        Intake/Output Summary (Last 24 hours) at 08/09/2019 1421 Last data  filed at 08/09/2019 0452 Gross per 24 hour  Intake 1557.6 ml  Output 350 ml  Net 1207.6 ml   Filed Weights   08/08/19 1022 08/08/19 2132 08/09/19 0449  Weight: 105.7 kg 105.9 kg 106.1  kg    Examination:  General exam: Appears calm and comfortable  Respiratory system: Clear to auscultation. Respiratory effort normal. Cardiovascular system: S1 & S2 heard, RRR. No JVD, murmurs, rubs, gallops or clicks. No pedal edema. Gastrointestinal system: Abdomen is nondistended, soft and nontender. No organomegaly or masses felt. Normal bowel sounds heard. Central nervous system: Alert and oriented. No focal neurological deficits. Extremities: Symmetric 5 x 5 power. Skin: No rashes, lesions or ulcers Psychiatry: Judgement and insight appear normal. Mood & affect appropriate.     Data Reviewed: I have personally reviewed following labs and imaging studies  CBC: Recent Labs  Lab 08/08/19 1108 08/09/19 0344  WBC 11.5* 8.5  NEUTROABS 9.5*  --   HGB 8.9* 8.3*  HCT 29.8* 27.0*  MCV 102.1* 100.4*  PLT 330 284   Basic Metabolic Panel: Recent Labs  Lab 08/08/19 1108 08/09/19 0344  NA 141 139  K 4.8 4.2  CL 105 105  CO2 25 23  GLUCOSE 145* 130*  BUN 12 12  CREATININE 0.87 0.86  CALCIUM 8.9 8.5*   GFR: Estimated Creatinine Clearance: 76.1 mL/min (by C-G formula based on SCr of 0.86 mg/dL). Liver Function Tests: Recent Labs  Lab 08/08/19 1108  AST 17  ALT 12  ALKPHOS 113  BILITOT 0.4  PROT 5.9*  ALBUMIN 2.6*   No results for input(s): LIPASE, AMYLASE in the last 168 hours. No results for input(s): AMMONIA in the last 168 hours. Coagulation Profile: Recent Labs  Lab 08/08/19 1419 08/09/19 0344  INR >10.0* >10.0*   Cardiac Enzymes: No results for input(s): CKTOTAL, CKMB, CKMBINDEX, TROPONINI in the last 168 hours. BNP (last 3 results) No results for input(s): PROBNP in the last 8760 hours. HbA1C: No results for input(s): HGBA1C in the last 72 hours. CBG: Recent Labs  Lab 08/08/19 1749 08/08/19 2143 08/09/19 0612 08/09/19 0826 08/09/19 1131  GLUCAP 93 62* 93 139* 107*   Lipid Profile: No results for input(s): CHOL, HDL, LDLCALC, TRIG, CHOLHDL,  LDLDIRECT in the last 72 hours. Thyroid Function Tests: No results for input(s): TSH, T4TOTAL, FREET4, T3FREE, THYROIDAB in the last 72 hours. Anemia Panel: Recent Labs    08/08/19 1802 08/09/19 0344  FERRITIN 31 22   Sepsis Labs: Recent Labs  Lab 08/08/19 1108 08/08/19 1419  LATICACIDVEN 2.7* 2.2*    Recent Results (from the past 240 hour(s))  Culture, blood (routine x 2)     Status: None (Preliminary result)   Collection Time: 08/08/19 11:09 AM   Specimen: BLOOD RIGHT HAND  Result Value Ref Range Status   Specimen Description BLOOD RIGHT HAND  Final   Special Requests   Final    BOTTLES DRAWN AEROBIC AND ANAEROBIC Blood Culture adequate volume   Culture   Final    NO GROWTH < 24 HOURS Performed at [facility], [place] [address] [place], [place] [address]    Report Status PENDING  Incomplete  Culture, blood (routine x 2)     Status: None (Preliminary result)   Collection Time: 08/08/19  2:15 PM   Specimen: BLOOD  Result Value Ref Range Status   Specimen Description BLOOD RIGHT ANTECUBITAL  Final   Special Requests   Final    BOTTLES DRAWN AEROBIC AND ANAEROBIC Blood Culture  adequate volume   Culture   Final    NO GROWTH < 24 HOURS Performed at [facility], [place] [address] [place], [place] [address]    Report Status PENDING  Incomplete  SARS CORONAVIRUS 2 (TAT 6-24 HRS) Nasopharyngeal Nasopharyngeal Swab     Status: None   Collection Time: 08/08/19  2:31 PM   Specimen: Nasopharyngeal Swab  Result Value Ref Range Status   SARS Coronavirus 2 NEGATIVE NEGATIVE Final    Comment: (NOTE) SARS-CoV-2 target nucleic acids are NOT DETECTED. The SARS-CoV-2 RNA is generally detectable in upper and lower respiratory specimens during the acute phase of infection. Negative results do not preclude SARS-CoV-2 infection, do not rule out co-infections with other pathogens, and should not be used as the sole basis for treatment or other patient management  decisions. Negative results must be combined with clinical observations, patient history, and epidemiological information. The expected result is Negative. Fact Sheet for Patients: SugarRoll.be Fact Sheet for Healthcare Providers: https://www.woods-mathews.com/ This test is not yet approved or cleared by the [place] FDA and  has been authorized for detection and/or diagnosis of SARS-CoV-2 by FDA under an Emergency Use Authorization (EUA). This EUA will remain  in effect (meaning this test can be used) for the duration of the COVID-19 declaration under Section 56 4(b)(1) of the Act, 21 U.S.C. section 360bbb-3(b)(1), unless the authorization is terminated or revoked sooner. Performed at [facility], [place] [address] [place], [place] [address]          Radiology Studies: DG Chest Port 1 View  Result Date: 08/08/2019 CLINICAL DATA:  Recent hospitalization. Recent pneumonia. Shortness of breath today. EXAM: PORTABLE CHEST 1 VIEW COMPARISON:  One-view chest x-ray 07/25/2019 FINDINGS: The heart is enlarged. Patient is status post median sternotomy for CABG. Sternal wires are broken, stable. Mild interstitial coarsening is present without focal airspace consolidation. Aeration is improved since the most recent exam. There are no significant effusions. IMPRESSION: 1. Stable cardiomegaly without failure. 2. Mild interstitial coarsening is likely chronic. 3. No focal airspace disease. Electronically Signed   By: San Morelle M.D.   On: 08/08/2019 11:53        Scheduled Meds: . azaTHIOprine  125 mg Oral QPM  . carbamazepine  200 mg Oral BID  . cyclobenzaprine  10 mg Oral BID  . diltiazem  120 mg Oral Daily  . dofetilide  250 mcg Oral BID  . DULoxetine  60 mg Oral Daily  . famotidine  20 mg Oral Daily  . fluticasone  2 spray Each Nare BID  . guaiFENesin  600 mg Oral BID  . insulin aspart  4 Units Subcutaneous BID PC  .  insulin glargine  12 Units Subcutaneous Daily  . metoprolol tartrate  25 mg Oral BID  . mometasone-formoterol  2 puff Inhalation BID  . montelukast  10 mg Oral Daily  . predniSONE  5 mg Oral Daily  . rosuvastatin  20 mg Oral Daily  . sertraline  200 mg Oral Daily  . sodium bicarbonate  1,300 mg Oral BID   Continuous Infusions: . ceFEPime (MAXIPIME) IV 2 g (08/09/19 1011)  . vancomycin 1,750 mg (08/09/19 1353)     LOS: 1 day   Time spent: 50min  Irelynd Zumstein C Kayde Warehime, DO [facility]  If 7PM-7AM, please contact night-coverage [email or web address] Password [email or web address] 08/09/2019, 2:21 PM

## 2019-08-10 LAB — PROTIME-INR
INR: 1.6 — ABNORMAL HIGH (ref 0.8–1.2)
Prothrombin Time: 19.3 seconds — ABNORMAL HIGH (ref 11.4–15.2)

## 2019-08-10 LAB — BASIC METABOLIC PANEL
Anion gap: 11 (ref 5–15)
BUN: 13 mg/dL (ref 8–23)
CO2: 23 mmol/L (ref 22–32)
Calcium: 8.6 mg/dL — ABNORMAL LOW (ref 8.9–10.3)
Chloride: 104 mmol/L (ref 98–111)
Creatinine, Ser: 0.86 mg/dL (ref 0.44–1.00)
GFR calc Af Amer: >60 mL/min (ref >60)
GFR calc non Af Amer: >60 mL/min (ref >60)
Glucose, Bld: 106 mg/dL — ABNORMAL HIGH (ref 70–99)
Potassium: 4.1 mmol/L (ref 3.5–5.1)
Sodium: 138 mmol/L (ref 135–145)

## 2019-08-10 LAB — D-DIMER, QUANTITATIVE: D-Dimer, Quant: <0.27 ug/mL-FEU (ref 0.00–0.50)

## 2019-08-10 LAB — MAGNESIUM: Magnesium: 1.7 mg/dL (ref 1.7–2.4)

## 2019-08-10 LAB — GLUCOSE, CAPILLARY
Glucose-Capillary: 111 mg/dL — ABNORMAL HIGH (ref 70–99)
Glucose-Capillary: 112 mg/dL — ABNORMAL HIGH (ref 70–99)
Glucose-Capillary: 253 mg/dL — ABNORMAL HIGH (ref 70–99)
Glucose-Capillary: 130 mg/dL — ABNORMAL HIGH (ref 70–99)

## 2019-08-10 LAB — FERRITIN: Ferritin: 49 ng/mL (ref 11–307)

## 2019-08-10 LAB — C-REACTIVE PROTEIN: CRP: 10.7 mg/dL — ABNORMAL HIGH (ref <1.0)

## 2019-08-10 MED ORDER — MAGNESIUM SULFATE 2 GM/50ML IV SOLN
2.0000 g | Freq: Once | INTRAVENOUS | Status: AC
  Administered 2019-08-10: 2 g via INTRAVENOUS
  Filled 2019-08-10: qty 50

## 2019-08-10 MED ORDER — WARFARIN SODIUM 5 MG PO TABS
5.0000 mg | ORAL_TABLET | Freq: Once | ORAL | Status: AC
  Administered 2019-08-10: 5 mg via ORAL
  Filled 2019-08-10: qty 1

## 2019-08-10 MED ORDER — WARFARIN - PHARMACIST DOSING INPATIENT: Freq: Every day | Status: DC

## 2019-08-10 NOTE — Progress Notes (Signed)
Chaplain responded to spiritual care consult.  [name] was in the room with her niece when chaplain arrived.  Chaplain engaged in initial visit and introduced herself and offered spiritual care support.  [name] noted that she was tired.   Chaplain will follow-up.

## 2019-08-10 NOTE — Progress Notes (Signed)
ANTICOAGULATION CONSULT NOTE - Consult  Pharmacy Consult for warfarin Indication: atrial fibrillation  Allergies  Allergen Reactions  . Tetracycline Hives    Patient tolerated Doxycycline Dec 2020  . Hydromorphone Hcl Nausea And Vomiting  . Niacin Other (See Comments)    Mouth blisters  . Niaspan [Niacin Er] Other (See Comments)    Mouth blisters  . Sulfa Antibiotics Nausea Only and Other (See Comments)    "Tears up stomach"  . Sulfonamide Derivatives Other (See Comments)    Reaction: per patient "tears her stomach up"  . Codeine Nausea And Vomiting  . Erythromycin Nausea And Vomiting  . Hydromorphone Hcl Nausea And Vomiting  . Morphine And Related Nausea And Vomiting  . Nalbuphine Nausea And Vomiting  . Sulfasalazine Nausea Only and Other (See Comments)    per patient "tears her stomach up", "Tears up stomach"  . Tape Rash and Other (See Comments)    No "plastic" tape," please    Patient Measurements: Height: 5\' 1"  (154.9 cm) Weight: 231 lb 0.7 oz (104.8 kg) IBW/kg (Calculated) : 47.8   Vital Signs: Temp: 98.4 F (36.9 C) (12/24 0818) Temp Source: Oral (12/24 0818) BP: 115/57 (12/24 0818) Pulse Rate: 81 (12/24 0818)  Labs: Recent Labs    08/08/19 1108 08/08/19 1419 08/09/19 0344 08/10/19 0606  HGB 8.9*  --  8.3*  --   HCT 29.8*  --  27.0*  --   PLT 330  --  290  --   LABPROT  --  >90.0* >90.0* 19.3*  INR  --  >10.0* >10.0* 1.6*  CREATININE 0.87  --  0.86 0.86    Estimated Creatinine Clearance: 75.6 mL/min (by C-G formula based on SCr of 0.86 mg/dL).   Medical History: Past Medical History:  Diagnosis Date  . Anemia   . Anxiety   . Bilateral carotid artery stenosis    Carotid duplex 03/6760: 9-50% LICA, 93-26% RICA, >71% RECA, f/u 1 yr suggested  . CAD (coronary artery disease) of bypass graft 5/01; 3/'02, 8/'03, 10/'04; 1/15   PCI x 5 to SVG-D1   . CAD in native artery 07/1993    3 Vessel Disease (LAD-D1 & RCA) -- CABG  . CAD S/P percutaneous  coronary angioplasty    PCI to SVG-D1 insertion/native D1 x 4 = '01 -(S660 BMS 2.5 x 9 - insertion into D1; '02 - distal overlap ACS Pixel 2.5 x 8  BMS; '03 distal/native ISR/Thrombosis - Pixel 2.5 x 13; '04 - ISR-  Taxus 2.5 x 20 (covered all);; 1/15 - mid SVG-D1 (50% distal ISR) - Promus P 2.75 x 20 -- 2.8 mm  . COPD mixed type ([place])    Followed by Dr. Lamonte Sakai "pulmonologist said no COPD"  . Depression   . Depression with anxiety   . Diabetes mellitus type 2 in obese ([place])   . Diarrhea    started after cholecystectomy and mass removed from intestine  . Dyslipidemia, goal LDL below 70    08/2012: TC 137, TG 200, HDL 32!, LDL 45; on statin (followed by Dr.Deterding)  . ESRD (end stage renal disease) ([place]) 1991   s/p Cadaveric Renal Transplant [facility] - Dr. Jimmy Footman)   . Family history of adverse reaction to anesthesia    mom's bp dropped during/after anesthesia  . Fibromyalgia   . GERD (gastroesophageal reflux disease)   . Glomerulonephritis, chronic, rapidly progressive 56  . 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 07/1993   Rescue PTCA of RCA -- referred for  CABG.  . H/O: GI bleed   . Headache    migraines in the past  . History of kidney stones   . Hypertension associated with diabetes ([place])   . Morbid obesity ([place])   . MRSA (methicillin resistant staph aureus) culture positive   . OSA (obstructive sleep apnea)    no longer on CPAP or home O2, states she doesn't need now after lap band  . PAD (peripheral artery disease) ([place]) 08/2013   LEA Dopplers to be read by Dr. Fletcher Anon  . PAF (paroxysmal atrial fibrillation) ([place]) 06/2014   Noted on CardioNet Monitor  - rate ~112; no recurrent symptoms. Nonionic regulation because of frequent GI procedures. Patient prefers Plavix  . Pneumonia   . Recurrent boils    Bilateral Groin  . Rheumatoid arthritis ([place])    Per Patient Report; associated with OA  . 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 Servando Snare: LIMA-LAD,  SVG-bifurcatingD1, SVG-rPDA  . S/p cadaver renal transplant [place]  . Stroke [facility]) 2012   "right eye stroke- half blind now"  . Unstable angina ([place]) 5/01; 3/'02, 8/'03, 10/'04; 1/15   x 5 occurences since Inf-Post STEMI in 1994    Medications:  Scheduled:  . azaTHIOprine  125 mg Oral QPM  . carbamazepine  200 mg Oral BID  . cyclobenzaprine  10 mg Oral BID  . diltiazem  120 mg Oral Daily  . dofetilide  250 mcg Oral BID  . DULoxetine  60 mg Oral Daily  . famotidine  20 mg Oral Daily  . fluticasone  2 spray Each Nare BID  . guaiFENesin  600 mg Oral BID  . insulin aspart  4 Units Subcutaneous BID PC  . insulin glargine  12 Units Subcutaneous Daily  . metoprolol tartrate  25 mg Oral BID  . mometasone-formoterol  2 puff Inhalation BID  . montelukast  10 mg Oral Daily  . predniSONE  5 mg Oral Daily  . rosuvastatin  20 mg Oral Daily  . sertraline  200 mg Oral Daily  . sodium bicarbonate  1,300 mg Oral BID    Assessment: 55 yof with a history of afib on warfarin PTA. INR on admission was > 10 (LD warfarin 12/21) so warfarin was held and 2.5 mg vitamin K was given on 12/23. INR today is subtherapeutic at 1.6. no CBC today but previous two hgb have been stable. plts are wnl. Of note, carbamazepine may decrease the effect of warfarin, but she is on both PTA. Her renal function has been stable.   Per med rec, PTA warfarin dose is 10 mg daily.     Goal of Therapy:  INR 2-3 Monitor platelets by anticoagulation protocol: Yes   Plan:  Warfarin 5 mg x 1 tonight Daily INR and CBC Monitor for bleeding     Thank you,   Eddie Candle, PharmD PGY-1 Pharmacy Resident   Please check amion for clinical pharmacist contact number

## 2019-08-10 NOTE — Progress Notes (Signed)
PROGRESS NOTE    Katherine [name]  QJJ:[id number] DOB: 06-Nov-1955 DOA: 08/08/2019 PCP: [name], MD   Brief Narrative:  Katherine [name] is a 63 y.o. 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  Patient was hospitalized and discharged 10 days ago with 1 episode of atypical pneumonia, and was discharged with p.o. doxycycline and Augmentin.  However patient said she never felt she fully recovered from that episode of pneumonia, and her symptoms has returned just in the couple days after discharge and started to get worse.  She also called while green-yellowish sputum moderate amount, denied any night sweats, denied any Covid contact after discharge.She denies any CP.  temperatures as high as 99.2, chills without sweats. She denies exposure to COVID-19. She states she felt this way once before when she was septic from a finger infection which she thinks is doing okay now. 05/2019 - just finished abx for a UTI the last several weeks which has been on doxy, then ciproand these symptoms have resolved. In ED: Temperature 99.7 in the ED, O2 saturation dropped with minimum activity, she was stabilized on 2 L at rest, x-ray showed chronic interstitial changes no acute infiltrates   Subjective: No acute issues or events overnight, feeling somewhat improved but not yet back to baseline, declines chest pain, nausea, vomiting, diarrhea, constipation, headache, fevers, chills.  Assessment & Plan:   Principal Problem:   HCAP (healthcare-associated pneumonia) Active Problems:   S/P kidney transplant   GERD   Morbid obesity - s/p Lap Band 9/'05   S/P CABG (coronary artery bypass graft) x 3   Dyslipidemia, goal LDL below  70   Acute respiratory failure with hypoxia and hypercapnia (HCC)   Sepsis ([place])   Acute hypoxic respiratory failure in the setting of HCAP, patient does meet sepsis criteria, POA,  Previous admitted to the hospital with worsening cough, fever and likely failure of outpatient antibiotics -Continue cefepime -MRSA nares, discontinue vancomycin -COVID-19 negative -D-dimer negative -Chest x-ray personally reviewed, right greater than left bibasilar opacifications concerning for pneumonia, moderately improved since previous admission but appears not yet resolved SpO2: 99 % O2 Flow Rate (L/min): 2 L/min  Paroxysmal A. Fib, rate controlled -Continue tikosyn and beta-blocker, Cardizem -Coumadin on hold previously given INR greater than 10 now resolving: -Defer to pharmacy for reinitiation of Coumadin per order set Lab Results  Component Value Date   INR 1.6 (H) 08/10/2019   INR >10.0 (HH) 08/09/2019   INR >10.0 (HH) 08/08/2019    IDDM, well controlled A1C 6.6 -Continue to hold her p.o. diabetes medications -Continue home insulin regimen.  Kidney transplant -Continue home meds -If worsening condition will contact transplant specialist and discuss prudence of holding meds  DVT prophylaxis: Coumadin(resumed 08/10/2019) Code Status: Full Family Communication: None available Disposition Plan: Likely home pending clinical status in the next 48 to 72 hours pending resolution of hypoxia Consults called: None Admission status: Inpatient, tele  Objective: Vitals:   08/09/19 1523 08/09/19 2110 08/10/19 0110 08/10/19 0502  BP: (!) 124/51 138/61 (!) 162/65 (!) 154/70  Pulse: 69 71 73 79  Resp:  18 20 18   Temp: 99.6 F (37.6 C) 98.6 F (37 C) 98.7 F (37.1 C) 98 F (36.7 C)  TempSrc: Oral Oral Oral Oral  SpO2: 99% 95% 100%  99%  Weight:    104.8 kg  Height:        Intake/Output Summary (Last 24 hours) at 08/10/2019 0652 Last data filed at 08/10/2019 0508 Gross per 24 hour    Intake 935.55 ml  Output 1400 ml  Net -464.45 ml   Filed Weights   08/08/19 2132 08/09/19 0449 08/10/19 0502  Weight: 105.9 kg 106.1 kg 104.8 kg    Examination:  General:  Pleasantly resting in bed, No acute distress. HEENT:  Normocephalic atraumatic.  Sclerae nonicteric, noninjected.  Extraocular movements intact bilaterally. Neck:  Without mass or deformity.  Trachea is midline. Lungs:  Clear to auscultate bilaterally without rhonchi, wheeze, or rales. Heart:  Regular rate and rhythm.  Without murmurs, rubs, or gallops. Abdomen:  Soft, nontender, nondistended.  Without guarding or rebound. Extremities: Without cyanosis, clubbing, edema, or obvious deformity. Vascular:  Dorsalis pedis and posterior tibial pulses palpable bilaterally. Skin:  Warm and dry, no erythema, no ulcerations.   Data Reviewed: I have personally reviewed following labs and imaging studies  CBC: Recent Labs  Lab 08/08/19 1108 08/09/19 0344  WBC 11.5* 8.5  NEUTROABS 9.5*  --   HGB 8.9* 8.3*  HCT 29.8* 27.0*  MCV 102.1* 100.4*  PLT 330 270   Basic Metabolic Panel: Recent Labs  Lab 08/08/19 1108 08/09/19 0344 08/10/19 0606  NA 141 139 138  K 4.8 4.2 4.1  CL 105 105 104  CO2 25 23 23   GLUCOSE 145* 130* 106*  BUN 12 12 13   CREATININE 0.87 0.86 0.86  CALCIUM 8.9 8.5* 8.6*  MG  --   --  1.7   GFR: Estimated Creatinine Clearance: 75.6 mL/min (by C-G formula based on SCr of 0.86 mg/dL). Liver Function Tests: Recent Labs  Lab 08/08/19 1108  AST 17  ALT 12  ALKPHOS 113  BILITOT 0.4  PROT 5.9*  ALBUMIN 2.6*   No results for input(s): LIPASE, AMYLASE in the last 168 hours. No results for input(s): AMMONIA in the last 168 hours. Coagulation Profile: Recent Labs  Lab 08/08/19 1419 08/09/19 0344 08/10/19 0606  INR >10.0* >10.0* 1.6*   Cardiac Enzymes: No results for input(s): CKTOTAL, CKMB, CKMBINDEX, TROPONINI in the last 168 hours. BNP (last 3 results) No results for input(s):  PROBNP in the last 8760 hours. HbA1C: No results for input(s): HGBA1C in the last 72 hours. CBG: Recent Labs  Lab 08/09/19 0612 08/09/19 0826 08/09/19 1131 08/09/19 1629 08/09/19 2135  GLUCAP 93 139* 107* 160* 111*   Lipid Profile: No results for input(s): CHOL, HDL, LDLCALC, TRIG, CHOLHDL, LDLDIRECT in the last 72 hours. Thyroid Function Tests: No results for input(s): TSH, T4TOTAL, FREET4, T3FREE, THYROIDAB in the last 72 hours. Anemia Panel: Recent Labs    08/08/19 1802 08/09/19 0344  FERRITIN 31 22   Sepsis Labs: Recent Labs  Lab 08/08/19 1108 08/08/19 1419  LATICACIDVEN 2.7* 2.2*    Recent Results (from the past 240 hour(s))  Culture, blood (routine x 2)     Status: None (Preliminary result)   Collection Time: 08/08/19 11:09 AM   Specimen: BLOOD RIGHT HAND  Result Value Ref Range Status   Specimen Description BLOOD RIGHT HAND  Final   Special Requests   Final    BOTTLES DRAWN AEROBIC AND ANAEROBIC Blood Culture adequate volume   Culture   Final    NO GROWTH 1 DAY Performed at [facility], [place] [address] [place], [place] [address]    Report Status PENDING  Incomplete  Culture, blood (routine x 2)     Status: None (Preliminary result)   Collection Time: 08/08/19  2:15 PM   Specimen: BLOOD  Result Value Ref Range Status   Specimen Description BLOOD RIGHT ANTECUBITAL  Final   Special Requests   Final    BOTTLES DRAWN AEROBIC AND ANAEROBIC Blood Culture adequate volume   Culture   Final    NO GROWTH < 24 HOURS Performed at [facility], [place] [address] [place], [place] [address]    Report Status PENDING  Incomplete  SARS CORONAVIRUS 2 (TAT 6-24 HRS) Nasopharyngeal Nasopharyngeal Swab     Status: None   Collection Time: 08/08/19  2:31 PM   Specimen: Nasopharyngeal Swab  Result Value Ref Range Status   SARS Coronavirus 2 NEGATIVE NEGATIVE Final    Comment: (NOTE) SARS-CoV-2 target nucleic acids are NOT DETECTED. The SARS-CoV-2 RNA is  generally detectable in upper and lower respiratory specimens during the acute phase of infection. Negative results do not preclude SARS-CoV-2 infection, do not rule out co-infections with other pathogens, and should not be used as the sole basis for treatment or other patient management decisions. Negative results must be combined with clinical observations, patient history, and epidemiological information. The expected result is Negative. Fact Sheet for Patients: SugarRoll.be Fact Sheet for Healthcare Providers: https://www.woods-mathews.com/ This test is not yet approved or cleared by the [place] FDA and  has been authorized for detection and/or diagnosis of SARS-CoV-2 by FDA under an Emergency Use Authorization (EUA). This EUA will remain  in effect (meaning this test can be used) for the duration of the COVID-19 declaration under Section 56 4(b)(1) of the Act, 21 U.S.C. section 360bbb-3(b)(1), unless the authorization is terminated or revoked sooner. Performed at [facility], [place] [address] [place], [place] [address]   MRSA PCR Screening     Status: None   Collection Time: 08/09/19 12:03 PM   Specimen: Nasal Mucosa; Nasopharyngeal  Result Value Ref Range Status   MRSA by PCR NEGATIVE NEGATIVE Final    Comment:        The GeneXpert MRSA Assay (FDA approved for NASAL specimens only), is one component of a comprehensive MRSA colonization surveillance program. It is not intended to diagnose MRSA infection nor to guide or monitor treatment for MRSA infections. Performed at [facility], [place] [address] [place], [place] [address]          Radiology Studies: DG Chest Port 1 View  Result Date: 08/08/2019 CLINICAL DATA:  Recent hospitalization. Recent pneumonia. Shortness of breath today. EXAM: PORTABLE CHEST 1 VIEW COMPARISON:  One-view chest x-ray 07/25/2019 FINDINGS: The heart is enlarged. Patient is status  post median sternotomy for CABG. Sternal wires are broken, stable. Mild interstitial coarsening is present without focal airspace consolidation. Aeration is improved since the most recent exam. There are no significant effusions. IMPRESSION: 1. Stable cardiomegaly without failure. 2. Mild interstitial coarsening is likely chronic. 3. No focal airspace disease. Electronically Signed   By: San Morelle M.D.   On: 08/08/2019 11:53        Scheduled Meds: . azaTHIOprine  125 mg Oral QPM  . carbamazepine  200 mg Oral BID  . cyclobenzaprine  10 mg Oral BID  . diltiazem  120 mg Oral Daily  . dofetilide  250 mcg Oral BID  . DULoxetine  60 mg Oral Daily  . famotidine  20 mg Oral Daily  . fluticasone  2 spray Each Nare BID  .  guaiFENesin  600 mg Oral BID  . insulin aspart  4 Units Subcutaneous BID PC  . insulin glargine  12 Units Subcutaneous Daily  . metoprolol tartrate  25 mg Oral BID  . mometasone-formoterol  2 puff Inhalation BID  . montelukast  10 mg Oral Daily  . predniSONE  5 mg Oral Daily  . rosuvastatin  20 mg Oral Daily  . sertraline  200 mg Oral Daily  . sodium bicarbonate  1,300 mg Oral BID   Continuous Infusions: . ceFEPime (MAXIPIME) IV 2 g (08/10/19 0022)  . vancomycin 1,750 mg (08/09/19 1353)     LOS: 2 days   Time spent: 36min  Quincy Prisco C Gloristine Turrubiates, DO [facility]  If 7PM-7AM, please contact night-coverage [email or web address] Password [facility] 08/10/2019, 6:52 AM

## 2019-08-11 ENCOUNTER — Inpatient Hospital Stay (HOSPITAL_COMMUNITY): Payer: Medicare Other

## 2019-08-11 DIAGNOSIS — R0981 Nasal congestion: Secondary | ICD-10-CM

## 2019-08-11 LAB — BASIC METABOLIC PANEL
Anion gap: 15 (ref 5–15)
BUN: 12 mg/dL (ref 8–23)
CO2: 22 mmol/L (ref 22–32)
Calcium: 8.5 mg/dL — ABNORMAL LOW (ref 8.9–10.3)
Chloride: 104 mmol/L (ref 98–111)
Creatinine, Ser: 0.83 mg/dL (ref 0.44–1.00)
GFR calc Af Amer: >60 mL/min (ref >60)
GFR calc non Af Amer: >60 mL/min (ref >60)
Glucose, Bld: 109 mg/dL — ABNORMAL HIGH (ref 70–99)
Potassium: 4.7 mmol/L (ref 3.5–5.1)
Sodium: 141 mmol/L (ref 135–145)

## 2019-08-11 LAB — FERRITIN: Ferritin: 58 ng/mL (ref 11–307)

## 2019-08-11 LAB — CBC
HCT: 26.1 % — ABNORMAL LOW (ref 36.0–46.0)
Hemoglobin: 8.2 g/dL — ABNORMAL LOW (ref 12.0–15.0)
MCH: 31.2 pg (ref 26.0–34.0)
MCHC: 31.4 g/dL (ref 30.0–36.0)
MCV: 99.2 fL (ref 80.0–100.0)
Platelets: 263 10*3/uL (ref 150–400)
RBC: 2.63 MIL/uL — ABNORMAL LOW (ref 3.87–5.11)
RDW: 17.8 % — ABNORMAL HIGH (ref 11.5–15.5)
WBC: 5.7 10*3/uL (ref 4.0–10.5)
nRBC: 0 % (ref 0.0–0.2)

## 2019-08-11 LAB — GLUCOSE, CAPILLARY
Glucose-Capillary: 96 mg/dL (ref 70–99)
Glucose-Capillary: 174 mg/dL — ABNORMAL HIGH (ref 70–99)
Glucose-Capillary: 162 mg/dL — ABNORMAL HIGH (ref 70–99)
Glucose-Capillary: 197 mg/dL — ABNORMAL HIGH (ref 70–99)
Glucose-Capillary: 210 mg/dL — ABNORMAL HIGH (ref 70–99)

## 2019-08-11 LAB — PROTIME-INR
INR: 1.6 — ABNORMAL HIGH (ref 0.8–1.2)
Prothrombin Time: 19.4 seconds — ABNORMAL HIGH (ref 11.4–15.2)

## 2019-08-11 LAB — MAGNESIUM: Magnesium: 1.8 mg/dL (ref 1.7–2.4)

## 2019-08-11 LAB — C-REACTIVE PROTEIN: CRP: 11.5 mg/dL — ABNORMAL HIGH (ref <1.0)

## 2019-08-11 LAB — D-DIMER, QUANTITATIVE: D-Dimer, Quant: 0.64 ug/mL-FEU — ABNORMAL HIGH (ref 0.00–0.50)

## 2019-08-11 MED ORDER — WARFARIN SODIUM 7.5 MG PO TABS
7.5000 mg | ORAL_TABLET | Freq: Once | ORAL | Status: AC
  Administered 2019-08-11: 7.5 mg via ORAL
  Filled 2019-08-11: qty 1

## 2019-08-11 MED ORDER — MAGNESIUM SULFATE 2 GM/50ML IV SOLN
2.0000 g | Freq: Once | INTRAVENOUS | Status: AC
  Administered 2019-08-11: 2 g via INTRAVENOUS
  Filled 2019-08-11: qty 50

## 2019-08-11 MED ORDER — METHYLPREDNISOLONE SODIUM SUCC 40 MG IJ SOLR
40.0000 mg | Freq: Two times a day (BID) | INTRAMUSCULAR | Status: AC
  Administered 2019-08-11 – 2019-08-12 (×2): 40 mg via INTRAVENOUS
  Filled 2019-08-11 (×2): qty 1

## 2019-08-11 NOTE — Progress Notes (Signed)
PROGRESS NOTE    Katherine [name]  NKN:[id number] DOB: Jun 05, 1956 DOA: 08/08/2019 PCP: [name], MD   Brief Narrative:  Katherine [name] is a 63 y.o. 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  Patient was hospitalized and discharged 10 days ago with 1 episode of atypical pneumonia, and was discharged with p.o. doxycycline and Augmentin.  However patient said she never felt she fully recovered from that episode of pneumonia, and her symptoms has returned just in the couple days after discharge and started to get worse.  She also called while green-yellowish sputum moderate amount, denied any night sweats, denied any Covid contact after discharge.She denies any CP.  temperatures as high as 99.2, chills without sweats. She denies exposure to COVID-19. She states she felt this way once before when she was septic from a finger infection which she thinks is doing okay now. 05/2019 - just finished abx for a UTI the last several weeks which has been on doxy, then ciproand these symptoms have resolved. In ED: Temperature 99.7 in the ED, O2 saturation dropped with minimum activity, she was stabilized on 2 L at rest, x-ray showed chronic interstitial changes no acute infiltrates   Subjective:  Patient reports some nasal congestion, some headache today, still reports some dyspnea, cough, productive.  Assessment & Plan:   Principal Problem:   HCAP (healthcare-associated pneumonia) Active Problems:   GERD   Morbid obesity - s/p Lap Band 9/'05   S/P CABG (coronary artery bypass graft) x 3   Dyslipidemia, goal LDL below 70   S/P kidney transplant   Acute respiratory failure with hypoxia and hypercapnia (HCC)  Sepsis ([place])   Acute hypoxic respiratory failure in the setting of HCAP, patient does meet sepsis criteria, POA,  Previous admitted to the hospital with worsening cough, fever and likely failure of outpatient antibiotics -Empirically on broad-spectrum antibiotic with vancomycin and cefepime, MRSA nares is negative, will discontinue vancomycin, will now we will continue with IV cefepime, low threshold to broaden antibiotic coverage given she is immunocompromised renal transplant patient. -COVID-19 negative -D-dimer negative -We will give low-dose IV Solu-Medrol today given she has some wheezing, and reports worsening dyspnea, with known underlying COPD disease SpO2: 99 % O2 Flow Rate (L/min): 2 L/min  Paroxysmal A. Fib, rate controlled -Continue tikosyn and beta-blocker, Cardizem -Coumadin on hold previously given INR greater than 10 now resolving: -Defer to pharmacy for reinitiation of Coumadin per order set Lab Results  Component Value Date   INR 1.6 (H) 08/11/2019   INR 1.6 (H) 08/10/2019   INR >10.0 (HH) 08/09/2019    IDDM, well controlled A1C 6.6 -Continue to hold her p.o. diabetes medications -Continue home insulin regimen.  Kidney transplant -Continue home meds   Does report some headache and nasal congestion today, will obtain DG sinuses to evaluate for acute sinusitis  DVT prophylaxis: Coumadin(resumed 08/10/2019) Code Status: Full Family Communication: None available Disposition Plan: Likely home, but for now we will continue with IV antibiotics, given she is renal transplant patient, immunocompromised. Consults called: None Admission status: Inpatient, tele  Objective: Vitals:   08/11/19 0356 08/11/19 0508 08/11/19 0839 08/11/19 1215  BP: (!) 150/90   (!) 124/50  Pulse: 85   62  Resp: 20   15  Temp: 98.5 F (36.9 C)   (!)  97.4 F (36.3 C)  TempSrc: Oral     SpO2: 93%  98% 99%  Weight:  108.9 kg    Height:        Intake/Output Summary (Last 24 hours)  at 08/11/2019 1253 Last data filed at 08/11/2019 0908 Gross per 24 hour  Intake 1190 ml  Output 1200 ml  Net -10 ml   Filed Weights   08/09/19 0449 08/10/19 0502 08/11/19 0508  Weight: 106.1 kg 104.8 kg 108.9 kg    Examination: Awake Alert, Oriented X 3, No new F.N deficits, Normal affect Symmetrical Chest wall movement, Good air movement bilaterally, she has some mild wheezing today RRR,No Gallops,Rubs or new Murmurs, No Parasternal Heave +ve B.Sounds, Abd Soft, No tenderness, No rebound - guarding or rigidity. No Cyanosis, Clubbing or edema, No new Rash or bruise    Data Reviewed: I have personally reviewed following labs and imaging studies  CBC: Recent Labs  Lab 08/08/19 1108 08/09/19 0344 08/11/19 0344  WBC 11.5* 8.5 5.7  NEUTROABS 9.5*  --   --   HGB 8.9* 8.3* 8.2*  HCT 29.8* 27.0* 26.1*  MCV 102.1* 100.4* 99.2  PLT 330 290 824   Basic Metabolic Panel: Recent Labs  Lab 08/08/19 1108 08/09/19 0344 08/10/19 0606 08/11/19 0344  NA 141 139 138 141  K 4.8 4.2 4.1 4.7  CL 105 105 104 104  CO2 25 23 23 22   GLUCOSE 145* 130* 106* 109*  BUN 12 12 13 12   CREATININE 0.87 0.86 0.86 0.83  CALCIUM 8.9 8.5* 8.6* 8.5*  MG  --   --  1.7  --    GFR: Estimated Creatinine Clearance: 80.1 mL/min (by C-G formula based on SCr of 0.83 mg/dL). Liver Function Tests: Recent Labs  Lab 08/08/19 1108  AST 17  ALT 12  ALKPHOS 113  BILITOT 0.4  PROT 5.9*  ALBUMIN 2.6*   No results for input(s): LIPASE, AMYLASE in the last 168 hours. No results for input(s): AMMONIA in the last 168 hours. Coagulation Profile: Recent Labs  Lab 08/08/19 1419 08/09/19 0344 08/10/19 0606 08/11/19 0344  INR >10.0* >10.0* 1.6* 1.6*   Cardiac Enzymes: No results for input(s): CKTOTAL, CKMB, CKMBINDEX, TROPONINI in the last 168 hours. BNP (last 3 results) No results for input(s): PROBNP in the last 8760 hours. HbA1C: No results for input(s): HGBA1C in the last 72 hours. CBG: Recent Labs   Lab 08/10/19 1620 08/10/19 2316 08/11/19 0358 08/11/19 1033 08/11/19 1211  GLUCAP 253* 130* 96 174* 162*   Lipid Profile: No results for input(s): CHOL, HDL, LDLCALC, TRIG, CHOLHDL, LDLDIRECT in the last 72 hours. Thyroid Function Tests: No results for input(s): TSH, T4TOTAL, FREET4, T3FREE, THYROIDAB in the last 72 hours. Anemia Panel: Recent Labs    08/10/19 0606 08/11/19 0344  FERRITIN 49 58   Sepsis Labs: Recent Labs  Lab 08/08/19 1108 08/08/19 1419  LATICACIDVEN 2.7* 2.2*    Recent Results (from the past 240 hour(s))  Culture, blood (routine x 2)     Status: None (Preliminary result)   Collection Time: 08/08/19 11:09 AM   Specimen: BLOOD RIGHT HAND  Result Value Ref Range Status   Specimen Description BLOOD RIGHT HAND  Final   Special Requests   Final    BOTTLES DRAWN AEROBIC AND ANAEROBIC Blood Culture adequate volume   Culture   Final    NO GROWTH 3 DAYS Performed at [facility], [place] [address], [place], [place] [address]    Report Status  PENDING  Incomplete  Culture, blood (routine x 2)     Status: None (Preliminary result)   Collection Time: 08/08/19  2:15 PM   Specimen: BLOOD  Result Value Ref Range Status   Specimen Description BLOOD RIGHT ANTECUBITAL  Final   Special Requests   Final    BOTTLES DRAWN AEROBIC AND ANAEROBIC Blood Culture adequate volume   Culture   Final    NO GROWTH 3 DAYS Performed at [facility], [address] [place], [place] [address]    Report Status PENDING  Incomplete  SARS CORONAVIRUS 2 (TAT 6-24 HRS) Nasopharyngeal Nasopharyngeal Swab     Status: None   Collection Time: 08/08/19  2:31 PM   Specimen: Nasopharyngeal Swab  Result Value Ref Range Status   SARS Coronavirus 2 NEGATIVE NEGATIVE Final    Comment: (NOTE) SARS-CoV-2 target nucleic acids are NOT DETECTED. The SARS-CoV-2 RNA is generally detectable in upper and lower respiratory specimens during the acute phase of infection. Negative results do  not preclude SARS-CoV-2 infection, do not rule out co-infections with other pathogens, and should not be used as the sole basis for treatment or other patient management decisions. Negative results must be combined with clinical observations, patient history, and epidemiological information. The expected result is Negative. Fact Sheet for Patients: SugarRoll.be Fact Sheet for Healthcare Providers: https://www.woods-mathews.com/ This test is not yet approved or cleared by the [place] FDA and  has been authorized for detection and/or diagnosis of SARS-CoV-2 by FDA under an Emergency Use Authorization (EUA). This EUA will remain  in effect (meaning this test can be used) for the duration of the COVID-19 declaration under Section 56 4(b)(1) of the Act, 21 U.S.C. section 360bbb-3(b)(1), unless the authorization is terminated or revoked sooner. Performed at [facility], [place] [address] [place], [place] [address]   MRSA PCR Screening     Status: None   Collection Time: 08/09/19 12:03 PM   Specimen: Nasal Mucosa; Nasopharyngeal  Result Value Ref Range Status   MRSA by PCR NEGATIVE NEGATIVE Final    Comment:        The GeneXpert MRSA Assay (FDA approved for NASAL specimens only), is one component of a comprehensive MRSA colonization surveillance program. It is not intended to diagnose MRSA infection nor to guide or monitor treatment for MRSA infections. Performed at [facility], [place] [address], [place], [place] [address]          Radiology Studies: No results found.      Scheduled Meds: . azaTHIOprine  125 mg Oral QPM  . carbamazepine  200 mg Oral BID  . cyclobenzaprine  10 mg Oral BID  . diltiazem  120 mg Oral Daily  . dofetilide  250 mcg Oral BID  . DULoxetine  60 mg Oral Daily  . famotidine  20 mg Oral Daily  . fluticasone  2 spray Each Nare BID  . guaiFENesin  600 mg Oral BID  . insulin aspart  4 Units  Subcutaneous BID PC  . insulin glargine  12 Units Subcutaneous Daily  . metoprolol tartrate  25 mg Oral BID  . mometasone-formoterol  2 puff Inhalation BID  . montelukast  10 mg Oral Daily  . predniSONE  5 mg Oral Daily  . rosuvastatin  20 mg Oral Daily  . sertraline  200 mg Oral Daily  . sodium bicarbonate  1,300 mg Oral BID  . Warfarin - Pharmacist Dosing Inpatient   Does not apply [phone]  Continuous Infusions: . ceFEPime (MAXIPIME) IV 2 g (08/11/19 0933)     LOS: 3 days   Phillips Climes, MD [facility]  If 7PM-7AM, please contact night-coverage [email or web address] Password [facility] 08/11/2019, 12:53 PM

## 2019-08-11 NOTE — Progress Notes (Signed)
ANTICOAGULATION CONSULT NOTE - Consult  Pharmacy Consult for warfarin Indication: atrial fibrillation  Allergies  Allergen Reactions  . Tetracycline Hives    Patient tolerated Doxycycline Dec 2020  . Hydromorphone Hcl Nausea And Vomiting  . Niacin Other (See Comments)    Mouth blisters  . Niaspan [Niacin Er] Other (See Comments)    Mouth blisters  . Sulfa Antibiotics Nausea Only and Other (See Comments)    "Tears up stomach"  . Sulfonamide Derivatives Other (See Comments)    Reaction: per patient "tears her stomach up"  . Codeine Nausea And Vomiting  . Erythromycin Nausea And Vomiting  . Hydromorphone Hcl Nausea And Vomiting  . Morphine And Related Nausea And Vomiting  . Nalbuphine Nausea And Vomiting  . Sulfasalazine Nausea Only and Other (See Comments)    per patient "tears her stomach up", "Tears up stomach"  . Tape Rash and Other (See Comments)    No "plastic" tape," please    Patient Measurements: Height: 5\' 1"  (154.9 cm) Weight: 240 lb 1.3 oz (108.9 kg) IBW/kg (Calculated) : 47.8   Vital Signs: Temp: 97.4 F (36.3 C) (12/25 1215) Temp Source: Oral (12/25 0356) BP: 124/50 (12/25 1215) Pulse Rate: 62 (12/25 1215)  Labs: Recent Labs    08/09/19 0344 08/10/19 0606 08/11/19 0344  HGB 8.3*  --  8.2*  HCT 27.0*  --  26.1*  PLT 290  --  263  LABPROT >90.0* 19.3* 19.4*  INR >10.0* 1.6* 1.6*  CREATININE 0.86 0.86 0.83    Estimated Creatinine Clearance: 80.1 mL/min (by C-G formula based on SCr of 0.83 mg/dL).   Medical History: Past Medical History:  Diagnosis Date  . Anemia   . Anxiety   . Bilateral carotid artery stenosis    Carotid duplex 03/1855: 3-14% LICA, 97-02% RICA, >63% RECA, f/u 1 yr suggested  . CAD (coronary artery disease) of bypass graft 5/01; 3/'02, 8/'03, 10/'04; 1/15   PCI x 5 to SVG-D1   . CAD in native artery 07/1993    3 Vessel Disease (LAD-D1 & RCA) -- CABG  . CAD S/P percutaneous coronary angioplasty    PCI to SVG-D1  insertion/native D1 x 4 = '01 -(S660 BMS 2.5 x 9 - insertion into D1; '02 - distal overlap ACS Pixel 2.5 x 8  BMS; '03 distal/native ISR/Thrombosis - Pixel 2.5 x 13; '04 - ISR-  Taxus 2.5 x 20 (covered all);; 1/15 - mid SVG-D1 (50% distal ISR) - Promus P 2.75 x 20 -- 2.8 mm  . COPD mixed type ([place])    Followed by Dr. Lamonte Sakai "pulmonologist said no COPD"  . Depression   . Depression with anxiety   . Diabetes mellitus type 2 in obese ([place])   . Diarrhea    started after cholecystectomy and mass removed from intestine  . Dyslipidemia, goal LDL below 70    08/2012: TC 137, TG 200, HDL 32!, LDL 45; on statin (followed by Dr.Deterding)  . ESRD (end stage renal disease) ([place]) 1991   s/p Cadaveric Renal Transplant [facility] - Dr. Jimmy Footman)   . Family history of adverse reaction to anesthesia    mom's bp dropped during/after anesthesia  . Fibromyalgia   . GERD (gastroesophageal reflux disease)   . Glomerulonephritis, chronic, rapidly progressive 26  . 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 07/1993   Rescue PTCA of RCA -- referred for CABG.  . H/O: GI bleed   . Headache    migraines in the past  . History  of kidney stones   . Hypertension associated with diabetes ([place])   . Morbid obesity ([place])   . MRSA (methicillin resistant staph aureus) culture positive   . OSA (obstructive sleep apnea)    no longer on CPAP or home O2, states she doesn't need now after lap band  . PAD (peripheral artery disease) ([place]) 08/2013   LEA Dopplers to be read by Dr. Fletcher Anon  . PAF (paroxysmal atrial fibrillation) ([place]) 06/2014   Noted on CardioNet Monitor  - rate ~112; no recurrent symptoms. Nonionic regulation because of frequent GI procedures. Patient prefers Plavix  . Pneumonia   . Recurrent boils    Bilateral Groin  . Rheumatoid arthritis ([place])    Per Patient Report; associated with OA  . 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 Servando Snare: LIMA-LAD, SVG-bifurcatingD1, SVG-rPDA  . S/p cadaver renal  transplant [place]  . Stroke [facility]) 2012   "right eye stroke- half blind now"  . Unstable angina ([place]) 5/01; 3/'02, 8/'03, 10/'04; 1/15   x 5 occurences since Inf-Post STEMI in 1994    Medications:  Scheduled:  . azaTHIOprine  125 mg Oral QPM  . carbamazepine  200 mg Oral BID  . cyclobenzaprine  10 mg Oral BID  . diltiazem  120 mg Oral Daily  . dofetilide  250 mcg Oral BID  . DULoxetine  60 mg Oral Daily  . famotidine  20 mg Oral Daily  . fluticasone  2 spray Each Nare BID  . guaiFENesin  600 mg Oral BID  . insulin aspart  4 Units Subcutaneous BID PC  . insulin glargine  12 Units Subcutaneous Daily  . methylPREDNISolone (SOLU-MEDROL) injection  40 mg Intravenous Q12H  . metoprolol tartrate  25 mg Oral BID  . mometasone-formoterol  2 puff Inhalation BID  . montelukast  10 mg Oral Daily  . predniSONE  5 mg Oral Daily  . rosuvastatin  20 mg Oral Daily  . sertraline  200 mg Oral Daily  . sodium bicarbonate  1,300 mg Oral BID  . Warfarin - Pharmacist Dosing Inpatient   Does not apply q1800    Assessment: 54 yof with a history of afib on warfarin PTA. INR on admission was > 10 (LD warfarin 12/21) so warfarin was held and 2.5 mg vitamin K was given on 12/23. INR today is subtherapeutic at 1.6. no CBC today but previous two hgb have been stable. plts are wnl. Of note, carbamazepine may decrease the effect of warfarin, but she is on both PTA. Her renal function has been stable.   Per med rec, PTA warfarin dose is 10 mg daily.     Goal of Therapy:  INR 2-3 Monitor platelets by anticoagulation protocol: Yes   Plan:  Warfarin 7.5 mg x 1 tonight Daily INR and CBC Monitor for bleeding    Thank you,   Sherren Kerns, PharmD PGY1 Acute Care Pharmacy Resident  Please check amion for clinical pharmacist contact number

## 2019-08-12 DIAGNOSIS — E785 Hyperlipidemia, unspecified: Secondary | ICD-10-CM

## 2019-08-12 LAB — IRON AND TIBC
Iron: 34 ug/dL (ref 28–170)
Saturation Ratios: 15 % (ref 10.4–31.8)
TIBC: 227 ug/dL — ABNORMAL LOW (ref 250–450)
UIBC: 193 ug/dL

## 2019-08-12 LAB — COMPREHENSIVE METABOLIC PANEL
ALT: 11 U/L (ref 0–44)
AST: 18 U/L (ref 15–41)
Albumin: 2.3 g/dL — ABNORMAL LOW (ref 3.5–5.0)
Alkaline Phosphatase: 89 U/L (ref 38–126)
Anion gap: 9 (ref 5–15)
BUN: 11 mg/dL (ref 8–23)
CO2: 23 mmol/L (ref 22–32)
Calcium: 8.4 mg/dL — ABNORMAL LOW (ref 8.9–10.3)
Chloride: 101 mmol/L (ref 98–111)
Creatinine, Ser: 0.96 mg/dL (ref 0.44–1.00)
GFR calc Af Amer: >60 mL/min (ref >60)
GFR calc non Af Amer: >60 mL/min (ref >60)
Glucose, Bld: 355 mg/dL — ABNORMAL HIGH (ref 70–99)
Potassium: 4.8 mmol/L (ref 3.5–5.1)
Sodium: 133 mmol/L — ABNORMAL LOW (ref 135–145)
Total Bilirubin: 0.2 mg/dL — ABNORMAL LOW (ref 0.3–1.2)
Total Protein: 5.9 g/dL — ABNORMAL LOW (ref 6.5–8.1)

## 2019-08-12 LAB — FOLATE: Folate: 7.1 ng/mL (ref >5.9)

## 2019-08-12 LAB — MAGNESIUM: Magnesium: 2.2 mg/dL (ref 1.7–2.4)

## 2019-08-12 LAB — CBC
HCT: 25.8 % — ABNORMAL LOW (ref 36.0–46.0)
Hemoglobin: 8.1 g/dL — ABNORMAL LOW (ref 12.0–15.0)
MCH: 31 pg (ref 26.0–34.0)
MCHC: 31.4 g/dL (ref 30.0–36.0)
MCV: 98.9 fL (ref 80.0–100.0)
Platelets: 291 10*3/uL (ref 150–400)
RBC: 2.61 MIL/uL — ABNORMAL LOW (ref 3.87–5.11)
RDW: 18 % — ABNORMAL HIGH (ref 11.5–15.5)
WBC: 5.7 10*3/uL (ref 4.0–10.5)
nRBC: 0.4 % — ABNORMAL HIGH (ref 0.0–0.2)

## 2019-08-12 LAB — VITAMIN B12: Vitamin B-12: 380 pg/mL (ref 180–914)

## 2019-08-12 LAB — RETICULOCYTES
Immature Retic Fract: 25.8 % — ABNORMAL HIGH (ref 2.3–15.9)
RBC.: 2.6 MIL/uL — ABNORMAL LOW (ref 3.87–5.11)
Retic Count, Absolute: 101.7 10*3/uL (ref 19.0–186.0)
Retic Ct Pct: 3.9 % — ABNORMAL HIGH (ref 0.4–3.1)

## 2019-08-12 LAB — C-REACTIVE PROTEIN: CRP: 8.9 mg/dL — ABNORMAL HIGH (ref <1.0)

## 2019-08-12 LAB — PROTIME-INR
INR: 2.1 — ABNORMAL HIGH (ref 0.8–1.2)
Prothrombin Time: 23.7 seconds — ABNORMAL HIGH (ref 11.4–15.2)

## 2019-08-12 LAB — FERRITIN: Ferritin: 65 ng/mL (ref 11–307)

## 2019-08-12 LAB — GLUCOSE, CAPILLARY
Glucose-Capillary: 368 mg/dL — ABNORMAL HIGH (ref 70–99)
Glucose-Capillary: 257 mg/dL — ABNORMAL HIGH (ref 70–99)
Glucose-Capillary: 337 mg/dL — ABNORMAL HIGH (ref 70–99)

## 2019-08-12 LAB — D-DIMER, QUANTITATIVE: D-Dimer, Quant: 0.48 ug/mL-FEU (ref 0.00–0.50)

## 2019-08-12 MED ORDER — PANTOPRAZOLE SODIUM 40 MG PO TBEC
40.0000 mg | DELAYED_RELEASE_TABLET | Freq: Every day | ORAL | Status: DC
  Administered 2019-08-12 – 2019-08-13 (×2): 40 mg via ORAL
  Filled 2019-08-12 (×2): qty 1

## 2019-08-12 MED ORDER — WARFARIN SODIUM 5 MG PO TABS
5.0000 mg | ORAL_TABLET | Freq: Once | ORAL | Status: AC
  Administered 2019-08-12: 5 mg via ORAL
  Filled 2019-08-12: qty 1

## 2019-08-12 MED ORDER — SODIUM CHLORIDE 0.9 % IV SOLN
INTRAVENOUS | Status: DC | PRN
  Administered 2019-08-12 (×2): 250 mL via INTRAVENOUS

## 2019-08-12 NOTE — Progress Notes (Addendum)
PROGRESS NOTE    Katherine [name]  ZJQ:[id number] DOB: Oct 15, 1955 DOA: 08/08/2019 PCP: Katherine Lima, MD   Brief Narrative:  Katherine [name] is a 63 y.o. 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  Patient was hospitalized and discharged 10 days ago with 1 episode of atypical pneumonia, and was discharged with p.o. doxycycline and Augmentin.  However patient said she never felt she fully recovered from that episode of pneumonia, and her symptoms has returned just in the couple days after discharge and started to get worse.  She also called while green-yellowish sputum moderate amount, denied any night sweats, denied any Covid contact after discharge.She denies any CP.  temperatures as high as 99.2, chills without sweats. She denies exposure to COVID-19. She states she felt this way once before when she was septic from a finger infection which she thinks is doing okay now. 05/2019 - just finished abx for a UTI the last several weeks which has been on doxy, then ciproand these symptoms have resolved. In ED: Temperature 99.7 in the ED, O2 saturation dropped with minimum activity, she was stabilized on 2 L at rest, x-ray showed chronic interstitial changes no acute infiltrates   Subjective:  No headache or nasal congestion today, report dyspnea has improved, cough has improved as well, and is less productive .  Assessment & Plan:   Principal Problem:   HCAP (healthcare-associated pneumonia) Active Problems:   GERD   Morbid obesity - s/p Lap Band 9/'05   S/P CABG (coronary artery bypass graft) x 3   Dyslipidemia, goal LDL below 70   S/P kidney transplant   Acute respiratory failure with hypoxia and  hypercapnia (HCC)   Sepsis ([place])   Acute hypoxic respiratory failure in the setting of HCAP, patient does meet sepsis criteria, POA,  Previous admitted to the hospital with worsening cough, fever and likely failure of outpatient antibiotics -Empirically on broad-spectrum antibiotic with vancomycin and cefepime, MRSA nares is negative, have stopped IV vancomycin, will continue with IV cefepime, to finish total of 5 days given she is immunocompromised on immunosuppressive therapy given her renal transplant, tomorrow would be the last day . -COVID-19 negative -D-dimer negative -Require some IV steroids where she had some wheezing, this appears to be resolved, back on her home dose prednisone. SpO2: 95 % O2 Flow Rate (L/min): 2 L/min  Paroxysmal A. Fib, rate controlled -Continue tikosyn and beta-blocker, Cardizem -Coumadin on hold previously given INR greater than 10 now resolving: -Defer to pharmacy for reinitiation of Coumadin per order set Lab Results  Component Value Date   INR 2.1 (H) 08/12/2019   INR 1.6 (H) 08/11/2019   INR 1.6 (H) 08/10/2019    IDDM, well controlled A1C 6.6 -Continue to hold her p.o. diabetes medications -Continue home insulin regimen.  Kidney transplant -Continue home meds  Anemia -Hemoglobin slowly drifting down,no evidence of significant GI bleed, most likely delusional, will obtain anemia panel. -Endoscopy in 2018 significant for acute gastritis, I will start her on PPI empirically given she is on chronic steroid therapy   DVT prophylaxis: Coumadin(resumed 08/10/2019) Code Status: Full Family Communication: None available Disposition Plan: Likely home tomorrow once she finishes her cefepime Consults called: None Admission status: Inpatient, tele  Objective: Vitals:   08/11/19 2002 08/12/19 0708 08/12/19 0719 08/12/19  1120  BP:   (!) 144/74 118/64  Pulse: 96  (!) 101 68  Resp: 20  20 15   Temp:   98.3 F (36.8 C) (!) 96.7 F (35.9 C)    TempSrc:   Oral   SpO2: 96%  93% 95%  Weight:  104.6 kg    Height:        Intake/Output Summary (Last 24 hours) at 08/12/2019 1401 Last data filed at 08/12/2019 1348 Gross per 24 hour  Intake 2120 ml  Output 500 ml  Net 1620 ml   Filed Weights   08/10/19 0502 08/11/19 0508 08/12/19 0708  Weight: 104.8 kg 108.9 kg 104.6 kg    Examination:  Awake Alert, Oriented X 3, No new F.N deficits, Normal affect Symmetrical Chest wall movement, Good air movement bilaterally, CTAB, no wheezing today RRR,No Gallops,Rubs or new Murmurs, No Parasternal Heave +ve B.Sounds, Abd Soft, No tenderness, No rebound - guarding or rigidity. No Cyanosis, Clubbing or edema, No new Rash or bruise     Data Reviewed: I have personally reviewed following labs and imaging studies  CBC: Recent Labs  Lab 08/08/19 1108 08/09/19 0344 08/11/19 0344 08/12/19 1304  WBC 11.5* 8.5 5.7 5.7  NEUTROABS 9.5*  --   --   --   HGB 8.9* 8.3* 8.2* 8.1*  HCT 29.8* 27.0* 26.1* 25.8*  MCV 102.1* 100.4* 99.2 98.9  PLT 330 290 263 240   Basic Metabolic Panel: Recent Labs  Lab 08/08/19 1108 08/09/19 0344 08/10/19 0606 08/11/19 0344 08/11/19 1430  NA 141 139 138 141  --   K 4.8 4.2 4.1 4.7  --   CL 105 105 104 104  --   CO2 25 23 23 22   --   GLUCOSE 145* 130* 106* 109*  --   BUN 12 12 13 12   --   CREATININE 0.87 0.86 0.86 0.83  --   CALCIUM 8.9 8.5* 8.6* 8.5*  --   MG  --   --  1.7  --  1.8   GFR: Estimated Creatinine Clearance: 78.2 mL/min (by C-G formula based on SCr of 0.83 mg/dL). Liver Function Tests: Recent Labs  Lab 08/08/19 1108  AST 17  ALT 12  ALKPHOS 113  BILITOT 0.4  PROT 5.9*  ALBUMIN 2.6*   No results for input(s): LIPASE, AMYLASE in the last 168 hours. No results for input(s): AMMONIA in the last 168 hours. Coagulation Profile: Recent Labs  Lab 08/08/19 1419 08/09/19 0344 08/10/19 0606 08/11/19 0344 08/12/19 1304  INR >10.0* >10.0* 1.6* 1.6* 2.1*   Cardiac Enzymes: No  results for input(s): CKTOTAL, CKMB, CKMBINDEX, TROPONINI in the last 168 hours. BNP (last 3 results) No results for input(s): PROBNP in the last 8760 hours. HbA1C: No results for input(s): HGBA1C in the last 72 hours. CBG: Recent Labs  Lab 08/11/19 1033 08/11/19 1211 08/11/19 1714 08/11/19 2226 08/12/19 1116  GLUCAP 174* 162* 197* 210* 368*   Lipid Profile: No results for input(s): CHOL, HDL, LDLCALC, TRIG, CHOLHDL, LDLDIRECT in the last 72 hours. Thyroid Function Tests: No results for input(s): TSH, T4TOTAL, FREET4, T3FREE, THYROIDAB in the last 72 hours. Anemia Panel: Recent Labs    08/10/19 0606 08/11/19 0344  FERRITIN 49 58   Sepsis Labs: Recent Labs  Lab 08/08/19 1108 08/08/19 1419  LATICACIDVEN 2.7* 2.2*    Recent Results (from the past 240 hour(s))  Culture, blood (routine x 2)     Status: None (Preliminary result)   Collection Time: 08/08/19 11:09 AM  Specimen: BLOOD RIGHT HAND  Result Value Ref Range Status   Specimen Description BLOOD RIGHT HAND  Final   Special Requests   Final    BOTTLES DRAWN AEROBIC AND ANAEROBIC Blood Culture adequate volume   Culture   Final    NO GROWTH 4 DAYS Performed at [facility], [address], [place], [place] [address]    Report Status PENDING  Incomplete  Culture, blood (routine x 2)     Status: None (Preliminary result)   Collection Time: 08/08/19  2:15 PM   Specimen: BLOOD  Result Value Ref Range Status   Specimen Description BLOOD RIGHT ANTECUBITAL  Final   Special Requests   Final    BOTTLES DRAWN AEROBIC AND ANAEROBIC Blood Culture adequate volume   Culture   Final    NO GROWTH 4 DAYS Performed at [facility], [place] [address], [place], [place] [address]    Report Status PENDING  Incomplete  SARS CORONAVIRUS 2 (TAT 6-24 HRS) Nasopharyngeal Nasopharyngeal Swab     Status: None   Collection Time: 08/08/19  2:31 PM   Specimen: Nasopharyngeal Swab  Result Value Ref Range Status   SARS Coronavirus  2 NEGATIVE NEGATIVE Final    Comment: (NOTE) SARS-CoV-2 target nucleic acids are NOT DETECTED. The SARS-CoV-2 RNA is generally detectable in upper and lower respiratory specimens during the acute phase of infection. Negative results do not preclude SARS-CoV-2 infection, do not rule out co-infections with other pathogens, and should not be used as the sole basis for treatment or other patient management decisions. Negative results must be combined with clinical observations, patient history, and epidemiological information. The expected result is Negative. Fact Sheet for Patients: SugarRoll.be Fact Sheet for Healthcare Providers: https://www.woods-mathews.com/ This test is not yet approved or cleared by the [place] FDA and  has been authorized for detection and/or diagnosis of SARS-CoV-2 by FDA under an Emergency Use Authorization (EUA). This EUA will remain  in effect (meaning this test can be used) for the duration of the COVID-19 declaration under Section 56 4(b)(1) of the Act, 21 U.S.C. section 360bbb-3(b)(1), unless the authorization is terminated or revoked sooner. Performed at [facility], [place] [address], [place], [place] [address]   MRSA PCR Screening     Status: None   Collection Time: 08/09/19 12:03 PM   Specimen: Nasal Mucosa; Nasopharyngeal  Result Value Ref Range Status   MRSA by PCR NEGATIVE NEGATIVE Final    Comment:        The GeneXpert MRSA Assay (FDA approved for NASAL specimens only), is one component of a comprehensive MRSA colonization surveillance program. It is not intended to diagnose MRSA infection nor to guide or monitor treatment for MRSA infections. Performed at [facility], [place] [address] [place], [place] [address]          Radiology Studies: DG Sinus 1-2 Views  Result Date: 08/11/2019 CLINICAL DATA:  Sinus congestion. EXAM: PARANASAL SINUSES - 1-2 VIEW COMPARISON:  The  FINDINGS: The paranasal sinus are aerated. There is no evidence of sinus opacification air-fluid levels or mucosal thickening. No significant bone abnormalities are seen. IMPRESSION: Negative. Electronically Signed   By: Misty Stanley M.D.   On: 08/11/2019 13:52        Scheduled Meds: . azaTHIOprine  125 mg Oral QPM  . carbamazepine  200 mg Oral BID  . cyclobenzaprine  10 mg Oral BID  . diltiazem  120 mg Oral Daily  . dofetilide  250 mcg Oral BID  . DULoxetine  60 mg Oral Daily  . famotidine  20 mg Oral Daily  . fluticasone  2 spray Each Nare BID  . guaiFENesin  600 mg Oral BID  . insulin aspart  4 Units Subcutaneous BID PC  . insulin glargine  12 Units Subcutaneous Daily  . metoprolol tartrate  25 mg Oral BID  . mometasone-formoterol  2 puff Inhalation BID  . montelukast  10 mg Oral Daily  . predniSONE  5 mg Oral Daily  . rosuvastatin  20 mg Oral Daily  . sertraline  200 mg Oral Daily  . sodium bicarbonate  1,300 mg Oral BID  . Warfarin - Pharmacist Dosing Inpatient   Does not apply q1800   Continuous Infusions: . sodium chloride 250 mL (08/12/19 0825)  . ceFEPime (MAXIPIME) IV 2 g (08/12/19 0826)     LOS: 4 days   Phillips Climes, MD [facility]  If 7PM-7AM, please contact night-coverage [email or web address] Password [email or web address] 08/12/2019, 2:01 PM

## 2019-08-12 NOTE — Progress Notes (Signed)
ANTICOAGULATION CONSULT NOTE - Consult  Pharmacy Consult for warfarin Indication: atrial fibrillation  Allergies  Allergen Reactions  . Tetracycline Hives    Patient tolerated Doxycycline Dec 2020  . Hydromorphone Hcl Nausea And Vomiting  . Niacin Other (See Comments)    Mouth blisters  . Niaspan [Niacin Er] Other (See Comments)    Mouth blisters  . Sulfa Antibiotics Nausea Only and Other (See Comments)    "Tears up stomach"  . Sulfonamide Derivatives Other (See Comments)    Reaction: per patient "tears her stomach up"  . Codeine Nausea And Vomiting  . Erythromycin Nausea And Vomiting  . Hydromorphone Hcl Nausea And Vomiting  . Morphine And Related Nausea And Vomiting  . Nalbuphine Nausea And Vomiting  . Sulfasalazine Nausea Only and Other (See Comments)    per patient "tears her stomach up", "Tears up stomach"  . Tape Rash and Other (See Comments)    No "plastic" tape," please    Patient Measurements: Height: 5\' 1"  (154.9 cm) Weight: 230 lb 8 oz (104.6 kg) IBW/kg (Calculated) : 47.8   Vital Signs: Temp: 96.7 F (35.9 C) (12/26 1120) Temp Source: Oral (12/26 0719) BP: 118/64 (12/26 1120) Pulse Rate: 68 (12/26 1120)  Labs: Recent Labs    08/10/19 0606 08/11/19 0344 08/12/19 1304  HGB  --  8.2* 8.1*  HCT  --  26.1* 25.8*  PLT  --  263 291  LABPROT 19.3* 19.4* 23.7*  INR 1.6* 1.6* 2.1*  CREATININE 0.86 0.83 0.96    Estimated Creatinine Clearance: 67.6 mL/min (by C-G formula based on SCr of 0.96 mg/dL).   Medical History: Past Medical History:  Diagnosis Date  . Anemia   . Anxiety   . Bilateral carotid artery stenosis    Carotid duplex 09/4399: 0-27% LICA, 25-36% RICA, >64% RECA, f/u 1 yr suggested  . CAD (coronary artery disease) of bypass graft 5/01; 3/'02, 8/'03, 10/'04; 1/15   PCI x 5 to SVG-D1   . CAD in native artery 07/1993    3 Vessel Disease (LAD-D1 & RCA) -- CABG  . CAD S/P percutaneous coronary angioplasty    PCI to SVG-D1 insertion/native  D1 x 4 = '01 -(S660 BMS 2.5 x 9 - insertion into D1; '02 - distal overlap ACS Pixel 2.5 x 8  BMS; '03 distal/native ISR/Thrombosis - Pixel 2.5 x 13; '04 - ISR-  Taxus 2.5 x 20 (covered all);; 1/15 - mid SVG-D1 (50% distal ISR) - Promus P 2.75 x 20 -- 2.8 mm  . COPD mixed type ([place])    Followed by Dr. Lamonte Sakai "pulmonologist said no COPD"  . Depression   . Depression with anxiety   . Diabetes mellitus type 2 in obese ([place])   . Diarrhea    started after cholecystectomy and mass removed from intestine  . Dyslipidemia, goal LDL below 70    08/2012: TC 137, TG 200, HDL 32!, LDL 45; on statin (followed by Dr.Deterding)  . ESRD (end stage renal disease) ([place]) 1991   s/p Cadaveric Renal Transplant [facility] - Dr. Jimmy Footman)   . Family history of adverse reaction to anesthesia    mom's bp dropped during/after anesthesia  . Fibromyalgia   . GERD (gastroesophageal reflux disease)   . Glomerulonephritis, chronic, rapidly progressive 22  . 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 07/1993   Rescue PTCA of RCA -- referred for CABG.  . H/O: GI bleed   . Headache    migraines in the past  . History  of kidney stones   . Hypertension associated with diabetes ([place])   . Morbid obesity ([place])   . MRSA (methicillin resistant staph aureus) culture positive   . OSA (obstructive sleep apnea)    no longer on CPAP or home O2, states she doesn't need now after lap band  . PAD (peripheral artery disease) ([place]) 08/2013   LEA Dopplers to be read by Dr. Fletcher Anon  . PAF (paroxysmal atrial fibrillation) ([place]) 06/2014   Noted on CardioNet Monitor  - rate ~112; no recurrent symptoms. Nonionic regulation because of frequent GI procedures. Patient prefers Plavix  . Pneumonia   . Recurrent boils    Bilateral Groin  . Rheumatoid arthritis ([place])    Per Patient Report; associated with OA  . 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 Servando Snare: LIMA-LAD, SVG-bifurcatingD1, SVG-rPDA  . S/p cadaver renal transplant [place]  . Stroke [facility]) 2012   "right eye stroke- half blind now"  . Unstable angina ([place]) 5/01; 3/'02, 8/'03, 10/'04; 1/15   x 5 occurences since Inf-Post STEMI in 1994    Medications:  Scheduled:  . azaTHIOprine  125 mg Oral QPM  . carbamazepine  200 mg Oral BID  . cyclobenzaprine  10 mg Oral BID  . diltiazem  120 mg Oral Daily  . dofetilide  250 mcg Oral BID  . DULoxetine  60 mg Oral Daily  . famotidine  20 mg Oral Daily  . fluticasone  2 spray Each Nare BID  . guaiFENesin  600 mg Oral BID  . insulin aspart  4 Units Subcutaneous BID PC  . insulin glargine  12 Units Subcutaneous Daily  . metoprolol tartrate  25 mg Oral BID  . mometasone-formoterol  2 puff Inhalation BID  . montelukast  10 mg Oral Daily  . pantoprazole  40 mg Oral Daily  . predniSONE  5 mg Oral Daily  . rosuvastatin  20 mg Oral Daily  . sertraline  200 mg Oral Daily  . sodium bicarbonate  1,300 mg Oral BID  . Warfarin - Pharmacist Dosing Inpatient   Does not apply q1800    Assessment: 34 yof with a history of afib on warfarin PTA. INR on admission was > 10 (LD warfarin 12/21) so warfarin was held and 2.5 mg vitamin K was given on 12/23. INR today is therapeutic at 2.1 CBC has been stable. plts are wnl. Of note, carbamazepine may decrease the effect of warfarin, but she is on both PTA. Her renal function has been stable.   Per med rec, PTA warfarin dose is 10 mg daily.     Goal of Therapy:  INR 2-3 Monitor platelets by anticoagulation protocol: Yes   Plan:  Warfarin 5 mg x 1 tonight Daily INR and CBC Monitor for bleeding    Thank you,   Sherren Kerns, PharmD PGY1 Acute Care Pharmacy Resident  Please check amion for clinical pharmacist contact number

## 2019-08-13 LAB — CBC
HCT: 26.3 % — ABNORMAL LOW (ref 36.0–46.0)
Hemoglobin: 8.1 g/dL — ABNORMAL LOW (ref 12.0–15.0)
MCH: 30.8 pg (ref 26.0–34.0)
MCHC: 30.8 g/dL (ref 30.0–36.0)
MCV: 100 fL (ref 80.0–100.0)
Platelets: 271 10*3/uL (ref 150–400)
RBC: 2.63 MIL/uL — ABNORMAL LOW (ref 3.87–5.11)
RDW: 18.4 % — ABNORMAL HIGH (ref 11.5–15.5)
WBC: 5.6 10*3/uL (ref 4.0–10.5)
nRBC: 0 % (ref 0.0–0.2)

## 2019-08-13 LAB — CULTURE, BLOOD (ROUTINE X 2)
Culture: NO GROWTH
Culture: NO GROWTH
Special Requests: ADEQUATE
Special Requests: ADEQUATE

## 2019-08-13 LAB — PROTIME-INR
INR: 2.8 — ABNORMAL HIGH (ref 0.8–1.2)
Prothrombin Time: 29.8 seconds — ABNORMAL HIGH (ref 11.4–15.2)

## 2019-08-13 LAB — D-DIMER, QUANTITATIVE: D-Dimer, Quant: 0.46 ug/mL-FEU (ref 0.00–0.50)

## 2019-08-13 LAB — BASIC METABOLIC PANEL
Anion gap: 10 (ref 5–15)
BUN: 15 mg/dL (ref 8–23)
CO2: 22 mmol/L (ref 22–32)
Calcium: 8.3 mg/dL — ABNORMAL LOW (ref 8.9–10.3)
Chloride: 104 mmol/L (ref 98–111)
Creatinine, Ser: 0.84 mg/dL (ref 0.44–1.00)
GFR calc Af Amer: >60 mL/min (ref >60)
GFR calc non Af Amer: >60 mL/min (ref >60)
Glucose, Bld: 245 mg/dL — ABNORMAL HIGH (ref 70–99)
Potassium: 4.2 mmol/L (ref 3.5–5.1)
Sodium: 136 mmol/L (ref 135–145)

## 2019-08-13 LAB — GLUCOSE, CAPILLARY: Glucose-Capillary: 301 mg/dL — ABNORMAL HIGH (ref 70–99)

## 2019-08-13 LAB — C-REACTIVE PROTEIN: CRP: 5.5 mg/dL — ABNORMAL HIGH (ref <1.0)

## 2019-08-13 LAB — FERRITIN: Ferritin: 32 ng/mL (ref 11–307)

## 2019-08-13 LAB — MAGNESIUM: Magnesium: 2 mg/dL (ref 1.7–2.4)

## 2019-08-13 MED ORDER — WARFARIN SODIUM 2.5 MG PO TABS: 2.5000 mg | ORAL_TABLET | Freq: Once | ORAL | Status: DC

## 2019-08-13 MED ORDER — FERROUS SULFATE 325 (65 FE) MG PO TABS: 325.0000 mg | ORAL_TABLET | Freq: Every day | ORAL | 0 refills | Status: DC

## 2019-08-13 MED ORDER — FERROUS SULFATE 325 (65 FE) MG PO TABS
325.0000 mg | ORAL_TABLET | Freq: Every day | ORAL | Status: DC
  Administered 2019-08-13: 325 mg via ORAL
  Filled 2019-08-13: qty 1

## 2019-08-13 NOTE — TOC Transition Note (Signed)
Transition of Care [facility]) - CM/SW Discharge Note   Patient Details  Name: Brookley Spitler MRN: [id number] Date of Birth: [birth year]  Transition of Care [facility]) CM/SW Contact:  Carles Collet, RN Phone Number: 08/13/2019, 1:30 PM   Clinical Narrative:    Damaris Schooner w patient on the phone. Would like to use [facility] for [facility] services. Discussed Medicare ratings, and referral accepted by liaison.  No DME needs, no other CM needs identified.    Final next level of care: [place] Barriers to Discharge: No Barriers Identified   Patient Goals and CMS Choice Patient states their goals for this hospitalization and ongoing recovery are:: to go home CMS Medicare.gov Compare Post Acute Care list provided to:: Patient Choice offered to / list presented to : Patient  Discharge Placement                       Discharge Plan and Services                          HH Arranged: PT, OT [facility] Agency: [place] ([facility]) Date [place]: 08/13/19 Time [place]: 1330 Representative spoke with at [place]: [place] ([employer]) Interventions     Readmission Risk Interventions No flowsheet data found.

## 2019-08-13 NOTE — Discharge Summary (Signed)
Discharge Summary  Katherine [name] WJX:[id number] DOB: Jul 08, 1956  PCP: Katherine Lima, MD  Admit date: 08/08/2019 Discharge date: 08/13/2019  Time spent: 35 minutes  Recommendations for Outpatient Follow-up:  1. Follow-up with your PCP.  2. Take your medications as prescribed. 3. Continue PT OT with home health PT OT with assistance and fall precautions.  Discharge Diagnoses:  Active Hospital Problems   Diagnosis Date Noted  . HCAP (healthcare-associated pneumonia) 08/08/2019  . Sepsis ([place]) 08/09/2019  . Acute respiratory failure with hypoxia and hypercapnia ([place]) 07/25/2019  . S/P kidney transplant 01/28/2018  . Dyslipidemia, goal LDL below 70 10/08/2012    Class: Diagnosis of  . GERD 09/09/2007  . Morbid obesity - s/p Lap Band 9/'05 05/07/2004    Class: Diagnosis of  . S/P CABG (coronary artery bypass graft) x 3 09/07/1993    Class: History of    Resolved Hospital Problems  No resolved problems to display.    Discharge Condition: Stable  Diet recommendation: Heart healthy carb modified diet.  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 doxycycline and Augmentin.However patient said she never felt she fully recovered from that episode of pneumonia,and her symptoms has returned just in the couple days after discharge and started to get worse.She also called while  green-yellowish sputum moderate amount,denied any night sweats,denied any Covid contact after discharge.She denies any CP. temperatures as high as 99.2, chills without sweats. She denies exposure to COVID-19. She states she felt this way once before when she was septic from a finger infection which she thinks is doing okay now. 05/2019 - just finished abx for a UTI the last several weeks which has been on doxy, then ciproand these symptoms have resolved.In OZ:HYQMVHQIONG 99.7 in the ED,O2 saturation dropped with minimum activity,she was stabilized on 2 L at rest,x-ray showed chronic interstitial changes no acute infiltrates.  08/13/19: Seen and examined.  No new complaints.  She wants to go home.  PT assessed and recommended home health PT OT.  Transition of care team consulted to assist with home health services.    Home O2 evaluation revealed: SATURATION QUALIFICATIONS: (This note is used to comply with regulatory documentation for home oxygen)  Patient Saturations on Room Air at Rest = 93%  Patient Saturations on Room Air while Ambulating = 92%  PT does not need home oxygen  Hospital Course:  Principal Problem:   HCAP (healthcare-associated pneumonia) Active Problems:   GERD   Morbid obesity - s/p Lap Band 9/'05   S/P CABG (coronary artery bypass graft) x 3   Dyslipidemia, goal LDL below 70   S/P kidney transplant   Acute respiratory failure with hypoxia and hypercapnia (HCC)   Sepsis ([place])  Resolved acute hypoxic respiratory failure in the setting of HCAP, patient does meet sepsis criteria, POA, Previous admitted to the hospital with worsening cough, fever and likely failure of outpatient antibiotics -Received IV vancomycin and cefepime empirically, MRSA nares is negative, stopped IV vancomycin, completed  5 days of IV cefepime.   -COVID-19 negative -D-dimer negative -Required some IV steroids due to wheezing, now resolved, back on her home dose prednisone. O2  saturation 98% on room air.  She passed her home O2 evaluation.  Paroxysmal A. Fib, rate controlled -Continue tikosyn, metoprolol, poCardizem INR therapeutic 2.8, 08/13/19. -Follow-up with your cardiologist.  Recent Labs       Lab Results  Component Value Date   INR 2.1 (H) 08/12/2019   INR 1.6 (H) 08/11/2019   INR 1.6 (H) 08/10/2019      IDDM, well controlled A1C 6.6 -Continue home insulin regimen.  Kidney transplant -Continue home meds  Iron deficiency anemia -Iron studies suggestive of iron deficiency, started ferrous sulfate 325 mg daily. -Follow-up with PCP outpatient -Hemoglobin stable 8.1 from 8.1 yesterday. -No sign of overt bleeding  Chronic diastolic CHF Last 2D echo done on 07/24/2019 showed LVEF 55 to 60% with grade 2 diastolic dysfunction.         No acute issues         Continue home regimen         Physical debility/ambulatory dysfunction PT assessed and recommended home health PT Continue PT OT with home health services with assistance and fall precautions.   DVT prophylaxis:Coumadin(resumed 08/10/2019) Code Status:Full Family Communication:None available Disposition Plan:Likely home tomorrow once she finishes her cefepime Consults called:None   Discharge Exam: BP 131/65 (BP Location: Right Arm)   Pulse 63   Temp 98.7 F (37.1 C) (Oral)   Resp 18   Ht 5\' 1"  (1.549 m)   Wt 107.3 kg   SpO2 98%   BMI 44.70 kg/m  . General: 63 y.o. year-old female well developed well nourished in no acute distress.  Alert and oriented x3. . Cardiovascular: Regular rate and rhythm with no rubs or gallops.  No thyromegaly or JVD noted.   [name] Kitchen Respiratory: Clear to auscultation with no wheezes or rales. Good inspiratory effort. . Abdomen: Soft nontender nondistended with normal bowel sounds x4 quadrants. . Musculoskeletal: No lower extremity edema. 2/4 pulses in all 4 extremities. [name] Kitchen Psychiatry: Mood is appropriate for condition and  setting  Discharge Instructions You were cared for by a hospitalist during your hospital stay. If you have any questions about your discharge medications or the care you received while you were in the hospital after you are discharged, you can call the unit and asked to speak with the hospitalist on call if the hospitalist that took care of you is not available. Once you are discharged, your primary care physician will handle any further medical issues. Please note that NO REFILLS for any discharge medications will be authorized once you are discharged, as it is imperative that you return to your primary care physician (or establish a relationship with a primary care physician if you do not have one) for your aftercare needs so that they can reassess your need for medications and monitor your lab values.   Allergies as of 08/13/2019      Reactions   Tetracycline Hives   Patient tolerated Doxycycline Dec 2020   Hydromorphone Hcl Nausea And Vomiting   Niacin Other (See Comments)   Mouth blisters   Niaspan [niacin Er] Other (See Comments)   Mouth blisters   Sulfa Antibiotics Nausea Only, Other (See Comments)   "Tears up stomach"   Sulfonamide Derivatives Other (See Comments)   Reaction: per patient "tears her stomach up"   Codeine Nausea And Vomiting   Erythromycin Nausea And Vomiting  Hydromorphone Hcl Nausea And Vomiting   Morphine And Related Nausea And Vomiting   Nalbuphine Nausea And Vomiting   Sulfasalazine Nausea Only, Other (See Comments)   per patient "tears her stomach up", "Tears up stomach"   Tape Rash, Other (See Comments)   No "plastic" tape," please      Medication List    STOP taking these medications   acetaminophen 500 MG tablet Commonly known as: TYLENOL     TAKE these medications   albuterol (2.5 MG/3ML) 0.083% nebulizer solution Commonly known as: PROVENTIL Take 3 mLs (2.5 mg total) by nebulization every 6 (six) hours as needed for wheezing or shortness of  breath (dx: J45.20). What changed: Another medication with the same name was changed. Make sure you understand how and when to take each.   Ventolin HFA 108 (90 Base) MCG/ACT inhaler Generic drug: albuterol TAKE 2 PUFFS BY MOUTH EVERY 6 HOURS AS NEEDED FOR WHEEZE OR SHORTNESS OF BREATH What changed: See the new instructions.   azaTHIOprine 50 MG tablet Commonly known as: IMURAN Take 125 mg by mouth every evening. Taking 2 & 1/2 tabs (125mg ) qd   Basaglar KwikPen 100 UNIT/ML Sopn Inject 12 Units into the skin daily.   budesonide 0.5 MG/2ML nebulizer solution Commonly known as: PULMICORT Take 2 mLs by nebulization 2 (two) times daily.   calcitRIOL 0.25 MCG capsule Commonly known as: ROCALTROL Take 0.25 mcg by mouth every 3 (three) days.   carbamazepine 200 MG tablet Commonly known as: TEGRETOL Take 1 tablet (200 mg total) by mouth 2 (two) times daily.   clotrimazole 10 MG troche Commonly known as: MYCELEX Take 1 tablet (10 mg total) by mouth 5 (five) times daily.   cyclobenzaprine 10 MG tablet Commonly known as: FLEXERIL TAKE 1 TABLET BY MOUTH 3 TIMES DAILY AS NEEDED FOR MUSCLE SPASMS What changed: See the new instructions.   diltiazem 120 MG 24 hr capsule Commonly known as: CARDIZEM CD TAKE 1 CAPSULE BY MOUTH EVERY DAY What changed: how much to take   dofetilide 250 MCG capsule Commonly known as: TIKOSYN TAKE 1 CAPSULE BY MOUTH 2 (TWO) TIMES DAILY. What changed:   how much to take  how to take this  when to take this   DULoxetine 30 MG capsule Commonly known as: CYMBALTA TAKE 2 CAPSULES (60 MG TOTAL) BY MOUTH EVERY MORNING. What changed: See the new instructions.   famotidine 20 MG tablet Commonly known as: PEPCID Take 20 mg by mouth daily.   ferrous sulfate 325 (65 FE) MG tablet Take 1 tablet (325 mg total) by mouth daily with breakfast. Start taking on: August 14, 2019   fluticasone 50 MCG/ACT nasal spray Commonly known as: FLONASE PLACE 2 SPRAYS  INTO BOTH NOSTRILS 2 TIMES DAILY What changed: See the new instructions.   glimepiride 4 MG tablet Commonly known as: AMARYL Take 4 mg by mouth every morning.   guaiFENesin 600 MG 12 hr tablet Commonly known as: MUCINEX Take 1 tablet (600 mg total) by mouth 2 (two) times daily.   LORazepam 1 MG tablet Commonly known as: ATIVAN Take 1 mg by mouth daily as needed for anxiety. Takes daily   metFORMIN 500 MG tablet Commonly known as: GLUCOPHAGE Take 500 mg by mouth 2 (two) times daily.   metoprolol tartrate 25 MG tablet Commonly known as: LOPRESSOR TAKE 1 TABLET BY MOUTH TWICE A DAY   montelukast 10 MG tablet Commonly known as: SINGULAIR TAKE 1 TABLET BY MOUTH EVERY DAY What changed: additional  instructions   nitroGLYCERIN 0.4 MG SL tablet Commonly known as: NITROSTAT Place 1 tablet (0.4 mg total) under the tongue every 5 (five) minutes as needed for chest pain.   NovoLOG FlexPen 100 UNIT/ML FlexPen Generic drug: insulin aspart Inject 4 Units into the skin 2 (two) times daily after a meal.   Oxycodone HCl 10 MG Tabs Take 1 tablet (10 mg total) by mouth every 8 (eight) hours as needed.   predniSONE 5 MG tablet Commonly known as: DELTASONE Take 5 mg by mouth daily.   promethazine 25 MG tablet Commonly known as: PHENERGAN Take 25 mg by mouth every 6 (six) hours as needed for nausea. Or for sleep   rosuvastatin 20 MG tablet Commonly known as: CRESTOR TAKE 1 TABLET BY MOUTH EVERY DAY   sertraline 100 MG tablet Commonly known as: ZOLOFT Take 200 mg by mouth daily.   sodium bicarbonate 650 MG tablet Take 1,300 mg by mouth 2 (two) times daily.   Symbicort 160-4.5 MCG/ACT inhaler Generic drug: budesonide-formoterol TAKE 2 PUFFS BY MOUTH TWICE A DAY What changed: See the new instructions.   warfarin 10 MG tablet Commonly known as: COUMADIN Take as directed. If you are unsure how to take this medication, talk to your nurse or doctor. Original instructions: TAKE 1  TABLET DAILY OR AS DIRECTED BY COUMADIN CLINIC What changed: See the new instructions.      Allergies  Allergen Reactions  . Tetracycline Hives    Patient tolerated Doxycycline Dec 2020  . Hydromorphone Hcl Nausea And Vomiting  . Niacin Other (See Comments)    Mouth blisters  . Niaspan [Niacin Er] Other (See Comments)    Mouth blisters  . Sulfa Antibiotics Nausea Only and Other (See Comments)    "Tears up stomach"  . Sulfonamide Derivatives Other (See Comments)    Reaction: per patient "tears her stomach up"  . Codeine Nausea And Vomiting  . Erythromycin Nausea And Vomiting  . Hydromorphone Hcl Nausea And Vomiting  . Morphine And Related Nausea And Vomiting  . Nalbuphine Nausea And Vomiting  . Sulfasalazine Nausea Only and Other (See Comments)    per patient "tears her stomach up", "Tears up stomach"  . Tape Rash and Other (See Comments)    No "plastic" tape," please   Follow-up Information    Katherine Lima, MD. Call in 1 day(s).   Specialty: Internal Medicine Why: Please call for a post hospital follow-up appointment. Contact information: [address] [address] [phone]        [name], MD .   Specialty: Cardiology Contact information: [address] [place] [address] [phone]            The results of significant diagnostics from this hospitalization (including imaging, microbiology, ancillary and laboratory) are listed below for reference.    Significant Diagnostic Studies: DG Sinus 1-2 Views  Result Date: 08/11/2019 CLINICAL DATA:  Sinus congestion. EXAM: PARANASAL SINUSES - 1-2 VIEW COMPARISON:  The FINDINGS: The paranasal sinus are aerated. There is no evidence of sinus opacification air-fluid levels or mucosal thickening. No significant bone abnormalities are seen. IMPRESSION: Negative. Electronically Signed   By: Misty Stanley M.D.   On: 08/11/2019 13:52   DG Chest 2 View  Result  Date: 07/25/2019 CLINICAL DATA:  Weakness, shortness of breath EXAM: CHEST - 2 VIEW COMPARISON:  07/23/2019 FINDINGS: Stable chronic interstitial prominence and areas of linear scarring. No pleural effusion or pneumothorax. Stable cardiomediastinal contours  with evidence of prior CABG. Degenerative changes of the included spine. IMPRESSION: No acute process in the chest. Electronically Signed   By: Macy Mis M.D.   On: 07/25/2019 08:28   DG Chest Port 1 View  Result Date: 08/08/2019 CLINICAL DATA:  Recent hospitalization. Recent pneumonia. Shortness of breath today. EXAM: PORTABLE CHEST 1 VIEW COMPARISON:  One-view chest x-ray 07/25/2019 FINDINGS: The heart is enlarged. Patient is status post median sternotomy for CABG. Sternal wires are broken, stable. Mild interstitial coarsening is present without focal airspace consolidation. Aeration is improved since the most recent exam. There are no significant effusions. IMPRESSION: 1. Stable cardiomegaly without failure. 2. Mild interstitial coarsening is likely chronic. 3. No focal airspace disease. Electronically Signed   By: San Morelle M.D.   On: 08/08/2019 11:53   DG Chest Port 1 View  Result Date: 07/23/2019 CLINICAL DATA:  Shortness of breath and generalized weakness for 2 weeks. Chest pain. Nausea and vomiting. EXAM: PORTABLE CHEST 1 VIEW COMPARISON:  12/19/2018 FINDINGS: Stable mild cardiomegaly and prior CABG. Mild scarring seen in the left midlung. Chronic pulmonary interstitial prominence noted. No evidence of acute infiltrate or edema. No evidence of pleural effusion. IMPRESSION: Stable mild cardiomegaly. No active lung disease. Electronically Signed   By: Marlaine Hind M.D.   On: 07/23/2019 04:34   ECHOCARDIOGRAM COMPLETE  Result Date: 07/24/2019   ECHOCARDIOGRAM REPORT   Patient Name:   MAURINA FAWAZ Date of Exam: 07/24/2019 Medical Rec #:  [id number]          Height:       61.0 in Accession #:    [id number]         Weight:        239.9 lb Date of Birth:  02/07/1956          BSA:          2.04 m Patient Age:    82 years           BP:           136/62 mmHg Patient Gender: F                  HR:           78 bpm. Exam Location:  Inpatient Procedure: 2D Echo Indications:    Dyspnea 786.09 / R06.00  History:        Patient has prior history of Echocardiogram examinations, most                 recent 10/27/2017. Prior CABG, Aortic valve sclerosis,                 Arrythmias:Atrial Fibrillation; Risk Factors:Dyslipidemia,                 Hypertension and Morbid obesity. ESRD                 Mitral annular calcification.  Sonographer:    Vikki Ports Turrentine Referring Phys: [id number] Jerald Kief A REGALADO  Sonographer Comments: Image acquisition challenging due to patient body habitus. IMPRESSIONS  1. Left ventricular ejection fraction, by visual estimation, is 55 to 60%. The left ventricle has normal function. There is no left ventricular hypertrophy.  2. Abnormal septal motion consistent with post-operative status.  3. Left ventricular diastolic parameters are consistent with Grade II diastolic dysfunction (pseudonormalization).  4. The left ventricle has no regional wall motion abnormalities.  5. Global right ventricle has normal systolic function.The right ventricular size is normal.  No increase in right ventricular wall thickness.  6. Left atrial size was mildly dilated.  7. Right atrial size was mildly dilated.  8. Presence of pericardial fat pad.  9. Moderate mitral annular calcification. 10. The mitral valve is normal in structure. Trace mitral valve regurgitation. 11. The tricuspid valve is normal in structure. Tricuspid valve regurgitation is trivial. 12. The aortic valve has an indeterminant number of cusps. Aortic valve regurgitation is trivial. Mild aortic valve stenosis. 13. The pulmonic valve was grossly normal. Pulmonic valve regurgitation is not visualized. 14. The inferior vena cava is normal in size with greater than 50% respiratory  variability, suggesting right atrial pressure of 3 mmHg. FINDINGS  Left Ventricle: Left ventricular ejection fraction, by visual estimation, is 55 to 60%. The left ventricle has normal function. The left ventricle has no regional wall motion abnormalities. There is no left ventricular hypertrophy. Abnormal (paradoxical) septal motion consistent with post-operative status. Left ventricular diastolic parameters are consistent with Grade II diastolic dysfunction (pseudonormalization). Right Ventricle: The right ventricular size is normal. No increase in right ventricular wall thickness. Global RV systolic function is has normal systolic function. Left Atrium: Left atrial size was mildly dilated. Right Atrium: Right atrial size was mildly dilated Pericardium: There is no evidence of pericardial effusion. Presence of pericardial fat pad. Mitral Valve: The mitral valve is normal in structure. Moderate mitral annular calcification. MV peak gradient, 10.8 mmHg. Trace mitral valve regurgitation. Tricuspid Valve: The tricuspid valve is normal in structure. Tricuspid valve regurgitation is trivial. Aortic Valve: The aortic valve has an indeterminant number of cusps. . There is moderate thickening and moderate calcification of the aortic valve. Aortic valve regurgitation is trivial. Mild aortic stenosis is present. Mild aortic valve annular calcification. There is moderate thickening of the aortic valve. There is moderate calcification of the aortic valve. Aortic valve mean gradient measures 14.3 mmHg. Aortic valve peak gradient measures 24.9 mmHg. Aortic valve area, by VTI measures 1.38 cm. Pulmonic Valve: The pulmonic valve was grossly normal. Pulmonic valve regurgitation is not visualized. Aorta: The aortic root, ascending aorta and aortic arch are all structurally normal, with no evidence of dilitation or obstruction. Venous: The inferior vena cava is normal in size with greater than 50% respiratory variability, suggesting  right atrial pressure of 3 mmHg. IAS/Shunts: No atrial level shunt detected by color flow Doppler.  LEFT VENTRICLE PLAX 2D LVIDd:         4.40 cm  Diastology LVIDs:         3.00 cm  LV e' medial:   5.63 cm/s LV PW:         1.10 cm  LV E/e' medial: 26.6 LV IVS:        1.10 cm LVOT diam:     1.80 cm LV SV:         53 ml LV SV Index:   23.62 LVOT Area:     2.54 cm  LEFT ATRIUM             Index       RIGHT ATRIUM           Index LA Vol (A2C):   83.1 ml 40.73 ml/m RA Area:     16.90 cm LA Vol (A4C):   72.0 ml 35.29 ml/m RA Volume:   41.60 ml  20.39 ml/m LA Biplane Vol: 79.9 ml 39.16 ml/m  AORTIC VALVE AV Area (Vmax):    1.47 cm AV Area (Vmean):   1.46 cm AV Area (  VTI):     1.38 cm AV Vmax:           249.33 cm/s AV Vmean:          178.000 cm/s AV VTI:            0.540 m AV Peak Grad:      24.9 mmHg AV Mean Grad:      14.3 mmHg LVOT Vmax:         144.00 cm/s LVOT Vmean:        102.000 cm/s LVOT VTI:          0.292 m LVOT/AV VTI ratio: 0.54  AORTA Ao Root diam: 2.70 cm MITRAL VALVE MV Area (PHT): 3.27 cm              SHUNTS MV Peak grad:  10.8 mmHg             Systemic VTI:  0.29 m MV Mean grad:  4.0 mmHg              Systemic Diam: 1.80 cm MV Vmax:       1.64 m/s MV Vmean:      89.8 cm/s MV VTI:        0.46 m MV PHT:        67.28 msec MV Decel Time: 232 msec MV E velocity: 150.00 cm/s 103 cm/s MV A velocity: 85.20 cm/s  70.3 cm/s MV E/A ratio:  1.76        1.5  Buford Dresser MD Electronically signed by Buford Dresser MD Signature Date/Time: 07/24/2019/9:36:03 PM    Final     Microbiology: Recent Results (from the past 240 hour(s))  Culture, blood (routine x 2)     Status: None   Collection Time: 08/08/19 11:09 AM   Specimen: BLOOD RIGHT HAND  Result Value Ref Range Status   Specimen Description BLOOD RIGHT HAND  Final   Special Requests   Final    BOTTLES DRAWN AEROBIC AND ANAEROBIC Blood Culture adequate volume   Culture   Final    NO GROWTH 5 DAYS Performed at [facility]  Lab, [address], [place], [place] [address]    Report Status 08/13/2019 FINAL  Final  Culture, blood (routine x 2)     Status: None   Collection Time: 08/08/19  2:15 PM   Specimen: BLOOD  Result Value Ref Range Status   Specimen Description BLOOD RIGHT ANTECUBITAL  Final   Special Requests   Final    BOTTLES DRAWN AEROBIC AND ANAEROBIC Blood Culture adequate volume   Culture   Final    NO GROWTH 5 DAYS Performed at [facility], [place] [address] [place], [place] [address]    Report Status 08/13/2019 FINAL  Final  SARS CORONAVIRUS 2 (TAT 6-24 HRS) Nasopharyngeal Nasopharyngeal Swab     Status: None   Collection Time: 08/08/19  2:31 PM   Specimen: Nasopharyngeal Swab  Result Value Ref Range Status   SARS Coronavirus 2 NEGATIVE NEGATIVE Final    Comment: (NOTE) SARS-CoV-2 target nucleic acids are NOT DETECTED. The SARS-CoV-2 RNA is generally detectable in upper and lower respiratory specimens during the acute phase of infection. Negative results do not preclude SARS-CoV-2 infection, do not rule out co-infections with other pathogens, and should not be used as the sole basis for treatment or other patient management decisions. Negative results must be combined with clinical observations, patient history, and epidemiological information. The expected result is Negative. Fact Sheet for  Patients: SugarRoll.be Fact Sheet for Healthcare Providers: https://www.woods-mathews.com/ This test is not yet approved or cleared by the [place] FDA and  has been authorized for detection and/or diagnosis of SARS-CoV-2 by FDA under an Emergency Use Authorization (EUA). This EUA will remain  in effect (meaning this test can be used) for the duration of the COVID-19 declaration under Section 56 4(b)(1) of the Act, 21 U.S.C. section 360bbb-3(b)(1), unless the authorization is terminated or revoked sooner. Performed at [facility], [place]  [address], [place], [place] [address]   MRSA PCR Screening     Status: None   Collection Time: 08/09/19 12:03 PM   Specimen: Nasal Mucosa; Nasopharyngeal  Result Value Ref Range Status   MRSA by PCR NEGATIVE NEGATIVE Final    Comment:        The GeneXpert MRSA Assay (FDA approved for NASAL specimens only), is one component of a comprehensive MRSA colonization surveillance program. It is not intended to diagnose MRSA infection nor to guide or monitor treatment for MRSA infections. Performed at [facility], [place] [address] [place],  [address]      Labs: Basic Metabolic Panel: Recent Labs  Lab 08/09/19 0344 08/10/19 0606 08/11/19 0344 08/11/19 1430 08/12/19 1304 08/13/19 0520  NA 139 138 141  --  133* 136  K 4.2 4.1 4.7  --  4.8 4.2  CL 105 104 104  --  101 104  CO2 23 23 22   --  23 22  GLUCOSE 130* 106* 109*  --  355* 245*  BUN 12 13 12   --  11 15  CREATININE 0.86 0.86 0.83  --  0.96 0.84  CALCIUM 8.5* 8.6* 8.5*  --  8.4* 8.3*  MG  --  1.7  --  1.8 2.2 2.0   Liver Function Tests: Recent Labs  Lab 08/08/19 1108 08/12/19 1304  AST 17 18  ALT 12 11  ALKPHOS 113 89  BILITOT 0.4 0.2*  PROT 5.9* 5.9*  ALBUMIN 2.6* 2.3*   No results for input(s): LIPASE, AMYLASE in the last 168 hours. No results for input(s): AMMONIA in the last 168 hours. CBC: Recent Labs  Lab 08/08/19 1108 08/09/19 0344 08/11/19 0344 08/12/19 1304 08/13/19 0520  WBC 11.5* 8.5 5.7 5.7 5.6  NEUTROABS 9.5*  --   --   --   --   HGB 8.9* 8.3* 8.2* 8.1* 8.1*  HCT 29.8* 27.0* 26.1* 25.8* 26.3*  MCV 102.1* 100.4* 99.2 98.9 100.0  PLT 330 290 263 291 271   Cardiac Enzymes: No results for input(s): CKTOTAL, CKMB, CKMBINDEX, TROPONINI in the last 168 hours. BNP: BNP (last 3 results) Recent Labs    07/23/19 1830  BNP 585.6*    ProBNP (last 3 results) No results for input(s): PROBNP in the last 8760 hours.  CBG: Recent Labs  Lab 08/11/19 2226 08/12/19 1116 08/12/19 1635  08/12/19 2115 08/13/19 1128  GLUCAP 210* 368* 257* 337* 301*       Signed:  Kayleen Memos, MD [facility] 08/13/2019, 1:05 PM

## 2019-08-13 NOTE — Progress Notes (Signed)
ANTICOAGULATION CONSULT NOTE - Consult  Pharmacy Consult for warfarin Indication: atrial fibrillation  Allergies  Allergen Reactions  . Tetracycline Hives    Patient tolerated Doxycycline Dec 2020  . Hydromorphone Hcl Nausea And Vomiting  . Niacin Other (See Comments)    Mouth blisters  . Niaspan [Niacin Er] Other (See Comments)    Mouth blisters  . Sulfa Antibiotics Nausea Only and Other (See Comments)    "Tears up stomach"  . Sulfonamide Derivatives Other (See Comments)    Reaction: per patient "tears her stomach up"  . Codeine Nausea And Vomiting  . Erythromycin Nausea And Vomiting  . Hydromorphone Hcl Nausea And Vomiting  . Morphine And Related Nausea And Vomiting  . Nalbuphine Nausea And Vomiting  . Sulfasalazine Nausea Only and Other (See Comments)    per patient "tears her stomach up", "Tears up stomach"  . Tape Rash and Other (See Comments)    No "plastic" tape," please    Patient Measurements: Height: 5\' 1"  (154.9 cm) Weight: 236 lb 8.9 oz (107.3 kg) IBW/kg (Calculated) : 47.8   Vital Signs: Temp: 97.6 F (36.4 C) (12/27 0539) BP: 157/74 (12/27 0822) Pulse Rate: 79 (12/27 0822)  Labs: Recent Labs    08/11/19 0344 08/12/19 1304 08/13/19 0520  HGB 8.2* 8.1* 8.1*  HCT 26.1* 25.8* 26.3*  PLT 263 291 271  LABPROT 19.4* 23.7* 29.8*  INR 1.6* 2.1* 2.8*  CREATININE 0.83 0.96 0.84    Estimated Creatinine Clearance: 78.5 mL/min (by C-G formula based on SCr of 0.84 mg/dL).   Medical History: Past Medical History:  Diagnosis Date  . Anemia   . Anxiety   . Bilateral carotid artery stenosis    Carotid duplex 04/6221: 9-79% LICA, 89-21% RICA, >19% RECA, f/u 1 yr suggested  . CAD (coronary artery disease) of bypass graft 5/01; 3/'02, 8/'03, 10/'04; 1/15   PCI x 5 to SVG-D1   . CAD in native artery 07/1993    3 Vessel Disease (LAD-D1 & RCA) -- CABG  . CAD S/P percutaneous coronary angioplasty    PCI to SVG-D1 insertion/native D1 x 4 = '01 -(S660 BMS 2.5 x  9 - insertion into D1; '02 - distal overlap ACS Pixel 2.5 x 8  BMS; '03 distal/native ISR/Thrombosis - Pixel 2.5 x 13; '04 - ISR-  Taxus 2.5 x 20 (covered all);; 1/15 - mid SVG-D1 (50% distal ISR) - Promus P 2.75 x 20 -- 2.8 mm  . COPD mixed type ([place])    Followed by Dr. Lamonte Sakai "pulmonologist said no COPD"  . Depression   . Depression with anxiety   . Diabetes mellitus type 2 in obese ([place])   . Diarrhea    started after cholecystectomy and mass removed from intestine  . Dyslipidemia, goal LDL below 70    08/2012: TC 137, TG 200, HDL 32!, LDL 45; on statin (followed by Dr.Deterding)  . ESRD (end stage renal disease) ([place]) 1991   s/p Cadaveric Renal Transplant [facility] - Dr. Jimmy Footman)   . Family history of adverse reaction to anesthesia    mom's bp dropped during/after anesthesia  . Fibromyalgia   . GERD (gastroesophageal reflux disease)   . Glomerulonephritis, chronic, rapidly progressive 35  . 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 07/1993   Rescue PTCA of RCA -- referred for CABG.  . H/O: GI bleed   . Headache    migraines in the past  . History of kidney stones   . Hypertension associated with diabetes ([place])   .  Morbid obesity ([place])   . MRSA (methicillin resistant staph aureus) culture positive   . OSA (obstructive sleep apnea)    no longer on CPAP or home O2, states she doesn't need now after lap band  . PAD (peripheral artery disease) ([place]) 08/2013   LEA Dopplers to be read by Dr. Fletcher Anon  . PAF (paroxysmal atrial fibrillation) ([place]) 06/2014   Noted on CardioNet Monitor  - rate ~112; no recurrent symptoms. Nonionic regulation because of frequent GI procedures. Patient prefers Plavix  . Pneumonia   . Recurrent boils    Bilateral Groin  . Rheumatoid arthritis ([place])    Per Patient Report; associated with OA  . 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 Servando Snare: LIMA-LAD, SVG-bifurcatingD1, SVG-rPDA  . S/p cadaver renal transplant [place]  . Stroke [facility]) 2012    "right eye stroke- half blind now"  . Unstable angina ([place]) 5/01; 3/'02, 8/'03, 10/'04; 1/15   x 5 occurences since Inf-Post STEMI in 1994    Medications:  Scheduled:  . azaTHIOprine  125 mg Oral QPM  . carbamazepine  200 mg Oral BID  . cyclobenzaprine  10 mg Oral BID  . diltiazem  120 mg Oral Daily  . dofetilide  250 mcg Oral BID  . DULoxetine  60 mg Oral Daily  . ferrous sulfate  325 mg Oral Q breakfast  . fluticasone  2 spray Each Nare BID  . guaiFENesin  600 mg Oral BID  . insulin aspart  4 Units Subcutaneous BID PC  . insulin glargine  12 Units Subcutaneous Daily  . metoprolol tartrate  25 mg Oral BID  . mometasone-formoterol  2 puff Inhalation BID  . montelukast  10 mg Oral Daily  . pantoprazole  40 mg Oral Daily  . predniSONE  5 mg Oral Daily  . rosuvastatin  20 mg Oral Daily  . sertraline  200 mg Oral Daily  . sodium bicarbonate  1,300 mg Oral BID  . Warfarin - Pharmacist Dosing Inpatient   Does not apply q1800    Assessment: 30 yof with a history of afib on warfarin PTA. INR on admission was > 10 (LD warfarin 12/21) so warfarin was held and 2.5 mg vitamin K was given on 12/23. INR today is therapeutic at 2.8 CBC has been stable. plts are wnl. Of note, carbamazepine may decrease the effect of warfarin, but she is on both PTA. Her renal function has been stable.   Per med rec, PTA warfarin dose is 10 mg daily.     Goal of Therapy:  INR 2-3 Monitor platelets by anticoagulation protocol: Yes   Plan:  Warfarin 2.5 mg x 1 tonight Daily INR and CBC Monitor for bleeding    Thank you,   Sherren Kerns, PharmD PGY1 Acute Care Pharmacy Resident  Please check amion for clinical pharmacist contact number

## 2019-08-13 NOTE — Progress Notes (Signed)
SATURATION QUALIFICATIONS: (This note is used to comply with regulatory documentation for home oxygen)  Patient Saturations on Room Air at Rest = 93%  Patient Saturations on Room Air while Ambulating = 92%    PT does not need home oxygen

## 2019-08-13 NOTE — Evaluation (Signed)
Physical Therapy Evaluation Patient Details Name: Katherine [name] MRN: [id number] DOB: Mar 02, 1956 Today's Date: 08/13/2019   History of Present Illness  Patient is a 63 y/o female who presents with SOB, weakness and cough. Found to have HCAP, d/ced 12/11 with atypical pneumonia. 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  Clinical Impression  Patient presents with generalized weakness, decreased activity tolerance and impaired mobility s/p above. Pt reports being Mod I PTA using rollator for community ambulation and furniture walking for household ambulation. Today, pt tolerated bed mobility, transfers and gait training with supervision-Mod I for safety using RW. SP02 stayed >91% on RA. Reports she normally does not walk long distances at home. Has a niece that can help her occasionally as needed. Will follow acutely to maximize independence and mobility prior to return home.     Follow Up Recommendations Home health PT;Supervision for mobility/OOB    Equipment Recommendations  None recommended by PT    Recommendations for Other Services       Precautions / Restrictions Precautions Precautions: Fall Restrictions Weight Bearing Restrictions: No      Mobility  Bed Mobility Overal bed mobility: Needs Assistance Bed Mobility: Supine to Sit;Sit to Supine     Supine to sit: Modified independent (Device/Increase time);HOB elevated Sit to supine: Modified independent (Device/Increase time);HOB elevated   General bed mobility comments: Use of rail, no assist needed.  Transfers Overall transfer level: Needs assistance Equipment used: Rolling walker (2 wheeled) Transfers: Sit to/from Stand Sit to Stand: Supervision         General transfer comment: supervision for safety, verbal cuing for proper hand placement when  rising/sitting  Ambulation/Gait Ambulation/Gait assistance: Supervision Gait Distance (Feet): 20 Feet(x2 bouts) Assistive device: Rolling walker (2 wheeled) Gait Pattern/deviations: Decreased stride length;Step-through pattern;Trunk flexed Gait velocity: decreased   General Gait Details: Slow, steady gait with RW for support; SP02 >91% on RA. 1 seated rest break for bathroom use. DIstance limited as pt's breakfast arrived.  Stairs            Wheelchair Mobility    Modified Rankin (Stroke Patients Only)       Balance Overall balance assessment: Needs assistance Sitting-balance support: Feet supported;No upper extremity supported Sitting balance-Leahy Scale: Good     Standing balance support: During functional activity Standing balance-Leahy Scale: Fair Standing balance comment: supervision standing with BUE support on RW, total A for pericare                             Pertinent Vitals/Pain Pain Assessment: No/denies pain    Home Living Family/patient expects to be discharged to:: Private residence Living Arrangements: (roommate) Available Help at Discharge: Family;Available PRN/intermittently;Available 24 hours/day Type of Home: Mobile home Home Access: Stairs to enter Entrance Stairs-Rails: Chemical engineer of Steps: 5 Home Layout: One level Home Equipment: Walker - 4 wheels;Cane - single point      Prior Function Level of Independence: Independent with assistive device(s)         Comments: used rollator in community and furniture walker in his mobile home. Reports 1 falls tripping over bed sheets.     Hand Dominance   Dominant Hand: Right    Extremity/Trunk Assessment   Upper Extremity Assessment Upper Extremity Assessment: Defer to OT evaluation    Lower Extremity Assessment Lower Extremity Assessment: Overall [facility]  for tasks assessed       Communication   Communication: No difficulties  Cognition  Arousal/Alertness: Awake/alert Behavior During Therapy: WFL for tasks assessed/performed Overall Cognitive Status: Within Functional Limits for tasks assessed                                        General Comments      Exercises     Assessment/Plan    PT Assessment Patient needs continued PT services  PT Problem List Decreased strength;Decreased balance;Cardiopulmonary status limiting activity;Obesity;Decreased knowledge of use of DME;Decreased mobility;Decreased activity tolerance;Decreased coordination       PT Treatment Interventions DME instruction;Functional mobility training;Balance training;Patient/family education;Gait training;Therapeutic activities;Neuromuscular re-education;Stair training;Therapeutic exercise    PT Goals (Current goals can be found in the Care Plan section)  Acute Rehab PT Goals Patient Stated Goal: go home PT Goal Formulation: With patient Time For Goal Achievement: 08/27/19 Potential to Achieve Goals: Good    Frequency Min 3X/week   Barriers to discharge        Co-evaluation               AM-PAC PT "6 Clicks" Mobility  Outcome Measure Help needed turning from your back to your side while in a flat bed without using bedrails?: A Little Help needed moving from lying on your back to sitting on the side of a flat bed without using bedrails?: A Little Help needed moving to and from a bed to a chair (including a wheelchair)?: A Little Help needed standing up from a chair using your arms (e.g., wheelchair or bedside chair)?: None Help needed to walk in hospital room?: A Little Help needed climbing 3-5 steps with a railing? : A Little 6 Click Score: 19    End of Session Equipment Utilized During Treatment: Gait belt Activity Tolerance: Patient tolerated treatment well Patient left: in bed;with call bell/phone within reach;with nursing/sitter in room Nurse Communication: Mobility status PT Visit Diagnosis: Muscle weakness  (generalized) (M62.81);Difficulty in walking, not elsewhere classified (R26.2)    Time: 3716-9678 PT Time Calculation (min) (ACUTE ONLY): 26 min   Charges:   PT Evaluation $PT Eval Moderate Complexity: 1 Mod PT Treatments $Therapeutic Activity: 8-22 mins        Marisa Severin, PT, DPT Acute Rehabilitation Services Pager [phone] Office [phone]      Marguarite Arbour A Sabra Heck 08/13/2019, 8:29 AM

## 2019-08-14 ENCOUNTER — Telehealth: Payer: Self-pay | Admitting: *Deleted

## 2019-08-14 NOTE — Telephone Encounter (Signed)
Transition Care Management Follow-up Telephone Call   Date discharged? 08/13/19   How have you been since you were released from the hospital? Pt states she is doing ok.. still have cough but not coughing up phlegm.. she states she's just tired    Do you understand why you were in the hospital? YES   Do you understand the discharge instructions? YES   Where were you discharged to? {Home   Items Reviewed:  Medications reviewed: YES, she states her medications are all still the same  Allergies reviewed: YES  Dietary changes reviewed: YES, heart healthy & carb modified  Referrals reviewed: YES, she states no one has called yet to set-up for PT/OT. Inform the orders was added so someone should be calling her today or tomorrow   Functional Questionnaire:   Activities of Daily Living (ADLs):   She states she are independent in the following: ambulation, bathing and hygiene, feeding, continence, grooming, toileting and dressing States she doesn't require assistance    Any transportation issues/concerns?: NO   Any patient concerns? NO   Confirmed importance and date/time of follow-up visits scheduled YES, appt 08/22/19  Provider Appointment booked with Dr. Ronnald Ramp  Confirmed with patient if condition begins to worsen call PCP or go to the ER.  Patient was given the office number and encouraged to call back with question or concerns.  : YES

## 2019-08-15 DIAGNOSIS — Z7952 Long term (current) use of systemic steroids: Secondary | ICD-10-CM | POA: Diagnosis not present

## 2019-08-15 DIAGNOSIS — M069 Rheumatoid arthritis, unspecified: Secondary | ICD-10-CM | POA: Diagnosis not present

## 2019-08-15 DIAGNOSIS — I6523 Occlusion and stenosis of bilateral carotid arteries: Secondary | ICD-10-CM | POA: Diagnosis not present

## 2019-08-15 DIAGNOSIS — J440 Chronic obstructive pulmonary disease with acute lower respiratory infection: Secondary | ICD-10-CM | POA: Diagnosis not present

## 2019-08-15 DIAGNOSIS — I110 Hypertensive heart disease with heart failure: Secondary | ICD-10-CM | POA: Diagnosis not present

## 2019-08-15 DIAGNOSIS — I5032 Chronic diastolic (congestive) heart failure: Secondary | ICD-10-CM | POA: Diagnosis not present

## 2019-08-15 DIAGNOSIS — Z794 Long term (current) use of insulin: Secondary | ICD-10-CM | POA: Diagnosis not present

## 2019-08-15 DIAGNOSIS — I0981 Rheumatic heart failure: Secondary | ICD-10-CM | POA: Diagnosis not present

## 2019-08-15 DIAGNOSIS — M797 Fibromyalgia: Secondary | ICD-10-CM | POA: Diagnosis not present

## 2019-08-15 DIAGNOSIS — D509 Iron deficiency anemia, unspecified: Secondary | ICD-10-CM | POA: Diagnosis not present

## 2019-08-15 DIAGNOSIS — J189 Pneumonia, unspecified organism: Secondary | ICD-10-CM | POA: Diagnosis not present

## 2019-08-15 DIAGNOSIS — I083 Combined rheumatic disorders of mitral, aortic and tricuspid valves: Secondary | ICD-10-CM | POA: Diagnosis not present

## 2019-08-15 DIAGNOSIS — I480 Paroxysmal atrial fibrillation: Secondary | ICD-10-CM | POA: Diagnosis not present

## 2019-08-15 DIAGNOSIS — Z7901 Long term (current) use of anticoagulants: Secondary | ICD-10-CM | POA: Diagnosis not present

## 2019-08-15 DIAGNOSIS — G4733 Obstructive sleep apnea (adult) (pediatric): Secondary | ICD-10-CM | POA: Diagnosis not present

## 2019-08-15 DIAGNOSIS — E785 Hyperlipidemia, unspecified: Secondary | ICD-10-CM | POA: Diagnosis not present

## 2019-08-15 DIAGNOSIS — F418 Other specified anxiety disorders: Secondary | ICD-10-CM | POA: Diagnosis not present

## 2019-08-15 DIAGNOSIS — I2510 Atherosclerotic heart disease of native coronary artery without angina pectoris: Secondary | ICD-10-CM | POA: Diagnosis not present

## 2019-08-15 DIAGNOSIS — A419 Sepsis, unspecified organism: Secondary | ICD-10-CM | POA: Diagnosis not present

## 2019-08-15 DIAGNOSIS — E1151 Type 2 diabetes mellitus with diabetic peripheral angiopathy without gangrene: Secondary | ICD-10-CM | POA: Diagnosis not present

## 2019-08-15 DIAGNOSIS — Z940 Kidney transplant status: Secondary | ICD-10-CM | POA: Diagnosis not present

## 2019-08-15 DIAGNOSIS — E1121 Type 2 diabetes mellitus with diabetic nephropathy: Secondary | ICD-10-CM | POA: Diagnosis not present

## 2019-08-15 DIAGNOSIS — Z6841 Body Mass Index (BMI) 40.0 and over, adult: Secondary | ICD-10-CM | POA: Diagnosis not present

## 2019-08-15 DIAGNOSIS — K219 Gastro-esophageal reflux disease without esophagitis: Secondary | ICD-10-CM | POA: Diagnosis not present

## 2019-08-16 ENCOUNTER — Telehealth: Payer: Self-pay

## 2019-08-16 ENCOUNTER — Telehealth: Payer: Self-pay | Admitting: Internal Medicine

## 2019-08-16 NOTE — Telephone Encounter (Signed)
Contacted Katherine [name] and he stated that pt only wants PT now. No verbal orders for OT given at this time.

## 2019-08-16 NOTE — Telephone Encounter (Signed)
[facility] Few [facility] requesting verbal orders for PT. 1x 1 week 2x 3weeks 1x 3weeks  Also requesting nursing for medication management.   Call back [phone]

## 2019-08-16 NOTE — Telephone Encounter (Signed)
Copied from [place] [phone]. Topic: General - Other >> Aug 16, 2019  8:52 AM Beverley Fiedler wrote: Erlene Quan from [facility] requesting home health for OT [phone]. Best contact [phone].

## 2019-08-16 NOTE — Telephone Encounter (Signed)
Verbal okay called to [facility] as requested.

## 2019-08-18 ENCOUNTER — Other Ambulatory Visit: Payer: Self-pay | Admitting: Cardiology

## 2019-08-22 ENCOUNTER — Encounter: Payer: Self-pay | Admitting: Adult Health

## 2019-08-22 ENCOUNTER — Telehealth (INDEPENDENT_AMBULATORY_CARE_PROVIDER_SITE_OTHER): Payer: Medicare Other | Admitting: Adult Health

## 2019-08-22 ENCOUNTER — Inpatient Hospital Stay: Payer: Medicare Other | Admitting: Internal Medicine

## 2019-08-22 ENCOUNTER — Encounter (HOSPITAL_COMMUNITY): Admission: RE | Payer: Self-pay | Source: Home / Self Care

## 2019-08-22 ENCOUNTER — Telehealth: Payer: Self-pay | Admitting: Emergency Medicine

## 2019-08-22 ENCOUNTER — Ambulatory Visit (HOSPITAL_COMMUNITY): Admission: RE | Admit: 2019-08-22 | Payer: Medicare Other | Source: Home / Self Care | Admitting: Gastroenterology

## 2019-08-22 ENCOUNTER — Other Ambulatory Visit: Payer: Self-pay

## 2019-08-22 DIAGNOSIS — Z7901 Long term (current) use of anticoagulants: Secondary | ICD-10-CM | POA: Diagnosis not present

## 2019-08-22 DIAGNOSIS — J189 Pneumonia, unspecified organism: Secondary | ICD-10-CM | POA: Diagnosis not present

## 2019-08-22 DIAGNOSIS — J4541 Moderate persistent asthma with (acute) exacerbation: Secondary | ICD-10-CM | POA: Diagnosis not present

## 2019-08-22 SURGERY — ESOPHAGOGASTRODUODENOSCOPY (EGD) WITH PROPOFOL
Anesthesia: Monitor Anesthesia Care

## 2019-08-22 NOTE — Progress Notes (Signed)
Virtual Visit via Video Note  I connected with Katherine [name] on 08/22/19 at 11:00 AM EST by a video enabled telemedicine application and verified that I am speaking with the correct person using two identifiers.  Location: Patient: Home  Provider: Office    I discussed the limitations of evaluation and management by telemedicine and the availability of in person appointments. The patient expressed understanding and agreed to proceed.  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  Patient has a complicated medical history is chronically immunosuppressed on Imuran and steroids with prednisone 5 mg daily due to previous renal transplant.  She has a medical history of rheumatoid arthritis, fibromyalgia, depression and diabetes.   Today's video visit is an acute office visit for persistent cough and shortness of breath.  Patient has recently been hospitalized twice last month for HCAP/atypical pneumonia Initial admission early December patient was treated for HCAP and acute hypoxic respiratory failure.  She was initially treated with doxycycline and Rocephin.  Along with a steroid burst.  She did receive some diuresis with Lasix.  2D echo showed grade 2 diastolic dysfunction with an EF of 55 to 60%.  She was discharged on Augmentin she was treated with aggressive IV antibiotics including vancomycin and cefepime along with a steroid burst.  Of note chest x-ray on December 6, December 8 December 22 all showed chronic changes with no acute process.  Sinus x-rays were negative.  Blood cultures were negative COVID-19 testing over the last months was negative x3.  Sputum culture was negative. Patient is on Coumadin INR 2.8 on August 13, 2019. She was supratherapeutic initially with INR >10 (08/09/19) .  Hemoglobin at discharge was 8.1.  She denies any  known bleeding.  Since discharge patient is feeling some better but says that she remains very weak.  Gets short of breath easily.  She has very low energy.  Continues to have a productive cough at times. Remains on pulmicort nebs Twice daily  .  She does have albuterol nebulizers but has not used them.  Says that sometimes they make her nervous.  She also has budesonide nebulizers to use in addition during flareups but has not used these.  Patient Active Problem List   Diagnosis Date Noted  . Sepsis ([place]) 08/09/2019  . HCAP (healthcare-associated pneumonia) 08/08/2019  . Hypoxemia   . Acute respiratory failure with hypoxia and hypercapnia ([place]) 07/25/2019  . Asthma exacerbation 07/23/2019  . Current chronic use of systemic steroids 07/23/2019  . OSA (obstructive sleep apnea)   . Severe episode of recurrent major depressive disorder, with psychotic features ([place]) 02/16/2019  . Asthma with bronchitis 12/16/2018  . Obstructive chronic bronchitis without exacerbation ([place]) 11/03/2018  . Long term (current) use of anticoagulants 04/28/2018  . S/P kidney transplant 01/28/2018  . Primary osteoarthritis of right knee 08/18/2017  . Aortic valve sclerosis 01/28/2017  . 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 Fibromyalgia 03/30/2016  . Other spondylosis with radiculopathy, lumbar region 03/30/2016  . Type 2 diabetes mellitus with peripheral neuropathy ([place]) 03/30/2016  . Paroxysmal atrial fibrillation ([place]) 06/28/2014  . Hypertension associated with diabetes ([place])   . PAD (peripheral artery disease) ([place]) 08/17/2013  . Stenosis of right carotid artery without infarction 10/08/2012  . Dyslipidemia, goal LDL below 70 10/08/2012    Class: Diagnosis of  . Mitral annular calcification 10/08/2012  . Healthcare-associated pneumonia 06/12/2012  .  Renal transplant disorder 06/12/2012  . Chronic allergic rhinitis 04/29/2011  . Extrinsic asthma 09/09/2007  . GERD 09/09/2007  . COUGH, CHRONIC 09/09/2007  .  Morbid obesity - s/p Lap Band 9/'05 05/07/2004    Class: Diagnosis of  . S/P CABG (coronary artery bypass graft) x 3 09/07/1993    Class: History of  . ESRD (end stage renal disease) ([place]) 1991   Current Outpatient Medications on File Prior to Visit  Medication Sig Dispense Refill  . albuterol (PROVENTIL) (2.5 MG/3ML) 0.083% nebulizer solution Take 3 mLs (2.5 mg total) by nebulization every 6 (six) hours as needed for wheezing or shortness of breath (dx: J45.20). 75 mL 2  . azaTHIOprine (IMURAN) 50 MG tablet Take 125 mg by mouth every evening. Taking 2 & 1/2 tabs ('125mg'$ ) qd    . budesonide (PULMICORT) 0.5 MG/2ML nebulizer solution Take 2 mLs by nebulization 2 (two) times daily.    . calcitRIOL (ROCALTROL) 0.25 MCG capsule Take 0.25 mcg by mouth every 3 (three) days.     . carbamazepine (TEGRETOL) 200 MG tablet Take 1 tablet (200 mg total) by mouth 2 (two) times daily. 180 tablet 0  . clotrimazole (MYCELEX) 10 MG troche Take 1 tablet (10 mg total) by mouth 5 (five) times daily. 35 tablet 0  . cyclobenzaprine (FLEXERIL) 10 MG tablet TAKE 1 TABLET BY MOUTH 3 TIMES DAILY AS NEEDED FOR MUSCLE SPASMS (Patient taking differently: Take 10 mg by mouth 2 (two) times daily. ) 90 tablet 2  . diltiazem (CARDIZEM CD) 120 MG 24 hr capsule TAKE 1 CAPSULE BY MOUTH EVERY DAY (Patient taking differently: Take 120 mg by mouth daily. ) 60 capsule 0  . dofetilide (TIKOSYN) 250 MCG capsule TAKE 1 CAPSULE BY MOUTH 2 (TWO) TIMES DAILY. (Patient taking differently: Take 250 mcg by mouth 2 (two) times daily. TAKE 1 CAPSULE BY MOUTH 2 (TWO) TIMES DAILY.) 60 capsule 6  . DULoxetine (CYMBALTA) 30 MG capsule TAKE 2 CAPSULES (60 MG TOTAL) BY MOUTH EVERY MORNING. (Patient taking differently: Take 60 mg by mouth daily. ) 180 capsule 2  . famotidine (PEPCID) 20 MG tablet Take 20 mg by mouth daily.    . ferrous sulfate 325 (65 FE) MG tablet Take 1 tablet (325 mg total) by mouth daily with breakfast. 60 tablet 0  . fluticasone  (FLONASE) 50 MCG/ACT nasal spray PLACE 2 SPRAYS INTO BOTH NOSTRILS 2 TIMES DAILY (Patient taking differently: Place 2 sprays into both nostrils 2 (two) times daily. ) 48 mL 3  . glimepiride (AMARYL) 4 MG tablet Take 4 mg by mouth every morning.  6  . guaiFENesin (MUCINEX) 600 MG 12 hr tablet Take 1 tablet (600 mg total) by mouth 2 (two) times daily. 30 tablet 0  . Insulin Glargine (BASAGLAR KWIKPEN) 100 UNIT/ML SOPN Inject 12 Units into the skin daily.    [name] Kitchen LORazepam (ATIVAN) 1 MG tablet Take 1 mg by mouth daily as needed for anxiety. Takes daily    . metFORMIN (GLUCOPHAGE) 500 MG tablet Take 500 mg by mouth 2 (two) times daily.  6  . metoprolol tartrate (LOPRESSOR) 25 MG tablet TAKE 1 TABLET BY MOUTH TWICE A DAY (Patient taking differently: Take 25 mg by mouth 2 (two) times daily. ) 180 tablet 0  . montelukast (SINGULAIR) 10 MG tablet TAKE 1 TABLET BY MOUTH EVERY DAY (Patient taking differently: Take 10 mg by mouth daily. TAKE 1 TABLET BY MOUTH EVERY DAY) 90 tablet 1  . nitroGLYCERIN (NITROSTAT) 0.4 MG SL tablet  Place 1 tablet (0.4 mg total) under the tongue every 5 (five) minutes as needed for chest pain. 25 tablet 6  . NOVOLOG FLEXPEN 100 UNIT/ML FlexPen Inject 4 Units into the skin 2 (two) times daily after a meal.   6  . Oxycodone HCl 10 MG TABS Take 1 tablet (10 mg total) by mouth every 8 (eight) hours as needed. 60 tablet 0  . predniSONE (DELTASONE) 5 MG tablet Take 5 mg by mouth daily.    . promethazine (PHENERGAN) 25 MG tablet Take 25 mg by mouth every 6 (six) hours as needed for nausea. Or for sleep    . rosuvastatin (CRESTOR) 20 MG tablet TAKE 1 TABLET BY MOUTH EVERY DAY (Patient taking differently: Take 20 mg by mouth daily. ) 90 tablet 3  . sertraline (ZOLOFT) 100 MG tablet Take 200 mg by mouth daily.    . sodium bicarbonate 650 MG tablet Take 1,300 mg by mouth 2 (two) times daily.    . SYMBICORT 160-4.5 MCG/ACT inhaler TAKE 2 PUFFS BY MOUTH TWICE A DAY (Patient taking differently:  Inhale 2 puffs into the lungs 2 (two) times daily. ) 30.6 Inhaler 1  . VENTOLIN HFA 108 (90 Base) MCG/ACT inhaler TAKE 2 PUFFS BY MOUTH EVERY 6 HOURS AS NEEDED FOR WHEEZE OR SHORTNESS OF BREATH (Patient taking differently: Inhale 2 puffs into the lungs every 6 (six) hours as needed for wheezing or shortness of breath. ) 18 g 5  . warfarin (COUMADIN) 10 MG tablet TAKE 1 TABLET DAILY OR AS DIRECTED BY COUMADIN CLINIC (Patient taking differently: Take 10 mg by mouth daily at 6 PM. Take 1 tablet daily or as directed by Coumadin clinic) 100 tablet 1   No current facility-administered medications on file prior to visit.    Observations/Objective:  O2 sats today 90% on room air .  Patient does not appear in any acute distress.  No increased work of breathing or accessory use.    Assessment and Plan: Acute asthmatic bronchitic exacerbation slow to resolve.  Patient has been admitted twice over the last month.  We will add in budesonide nebulizer for the next 5 days.  Increase her steroids for 1 week.  She has been diagnosed with pneumonia however sputum culture was negative and chest x-ray showed no acute infiltrates.  We will check a CT chest without contrast (patient is status post renal transplant).  Hold on additional antibiotics at this time.  If cough continues will repeat sputum culture and check sputum for fungus and AFB.   Anemia.  Hemoglobin did trend down during recent hospitalization.  She was also supratherapeutic on Coumadin.  No known active bleeding.  Will check CBC to make sure hemoglobin is stable  Chronically immunosuppressed with previous renal transplant CBC and be met are pending Continue on current regimen  Plan  Patient Instructions  Set up for CT chest and labs .  Use Budesonide Neb Twice daily  For 5 days.  Continue on Symbicort 2 puffs Twice daily  , rinse well after use.  Increase Prednisone '10mg'$  daily for 1 week then back '5mg'$  daily  May try Albuterol Neb 1/2 vial  daily for 3 days and then As needed   Follow up in 1 week with Dr. Lamonte Sakai  with video visit and As needed   Please contact office for sooner follow up if symptoms do not improve or worsen or seek emergency care       Follow Up Instructions: Follow-up in 1 week and  as needed Please contact office for sooner follow up if symptoms do not improve or worsen or seek emergency care     I discussed the assessment and treatment plan with the patient. The patient was provided an opportunity to ask questions and all were answered. The patient agreed with the plan and demonstrated an understanding of the instructions.   The patient was advised to call back or seek an in-person evaluation if the symptoms worsen or if the condition fails to improve as anticipated.  I provided 45 minutes of non-face-to-face time during this encounter.   Rexene Edison, NP

## 2019-08-22 NOTE — Telephone Encounter (Signed)
Patient recently discharge from hospital 08/13/19. Patient states she isn't feeling any better. She states her Oxygen dropped to 86% but now is 90-92%. She is short of breath and extreme fatigued. She wanted to see TP, mychart visit scheduled.  Nothing further needed.

## 2019-08-22 NOTE — Patient Instructions (Addendum)
Set up for CT chest and labs .  Use Budesonide Neb Twice daily  For 5 days.  Continue on Symbicort 2 puffs Twice daily  , rinse well after use.  Increase Prednisone 10mg  daily for 1 week then back 5mg  daily  May try Albuterol Neb 1/2 vial daily for 3 days and then As needed   Follow up in 1 week with Dr. Lamonte Sakai  with video visit and As needed   Please contact office for sooner follow up if symptoms do not improve or worsen or seek emergency care

## 2019-08-23 ENCOUNTER — Telehealth: Payer: Self-pay | Admitting: Adult Health

## 2019-08-23 ENCOUNTER — Other Ambulatory Visit: Payer: Self-pay

## 2019-08-23 ENCOUNTER — Other Ambulatory Visit (INDEPENDENT_AMBULATORY_CARE_PROVIDER_SITE_OTHER): Payer: Medicare Other

## 2019-08-23 ENCOUNTER — Ambulatory Visit (HOSPITAL_COMMUNITY)
Admission: RE | Admit: 2019-08-23 | Discharge: 2019-08-23 | Disposition: A | Discharge status: Home / Self Care | Payer: Medicare Other | Source: Ambulatory Visit | Attending: Adult Health | Admitting: Adult Health

## 2019-08-23 DIAGNOSIS — Z7901 Long term (current) use of anticoagulants: Secondary | ICD-10-CM | POA: Diagnosis not present

## 2019-08-23 DIAGNOSIS — J189 Pneumonia, unspecified organism: Secondary | ICD-10-CM

## 2019-08-23 DIAGNOSIS — J4541 Moderate persistent asthma with (acute) exacerbation: Secondary | ICD-10-CM | POA: Insufficient documentation

## 2019-08-23 LAB — BASIC METABOLIC PANEL
BUN: 17 mg/dL (ref 6–23)
CO2: 24 mEq/L (ref 19–32)
Calcium: 8.6 mg/dL (ref 8.4–10.5)
Chloride: 103 mEq/L (ref 96–112)
Creatinine, Ser: 0.77 mg/dL (ref 0.40–1.20)
GFR: 75.91 mL/min (ref >60.00)
Glucose, Bld: 217 mg/dL — ABNORMAL HIGH (ref 70–99)
Potassium: 4.5 mEq/L (ref 3.5–5.1)
Sodium: 135 mEq/L (ref 135–145)

## 2019-08-23 LAB — CBC WITH DIFFERENTIAL/PLATELET
Basophils Absolute: 0 10*3/uL (ref 0.0–0.1)
Basophils Relative: 0.5 % (ref 0.0–3.0)
Eosinophils Absolute: 0 10*3/uL (ref 0.0–0.7)
Eosinophils Relative: 0.3 % (ref 0.0–5.0)
HCT: 26.1 % — ABNORMAL LOW (ref 36.0–46.0)
Hemoglobin: 8.3 g/dL — ABNORMAL LOW (ref 12.0–15.0)
Lymphocytes Relative: 7.8 % — ABNORMAL LOW (ref 12.0–46.0)
Lymphs Abs: 0.6 10*3/uL — ABNORMAL LOW (ref 0.7–4.0)
MCHC: 31.9 g/dL (ref 30.0–36.0)
MCV: 96.5 fl (ref 78.0–100.0)
Monocytes Absolute: 0.5 10*3/uL (ref 0.1–1.0)
Monocytes Relative: 6.6 % (ref 3.0–12.0)
Neutro Abs: 6.6 10*3/uL (ref 1.4–7.7)
Neutrophils Relative %: 84.8 % — ABNORMAL HIGH (ref 43.0–77.0)
Platelets: 247 10*3/uL (ref 150.0–400.0)
RBC: 2.71 Mil/uL — ABNORMAL LOW (ref 3.87–5.11)
RDW: 19.4 % — ABNORMAL HIGH (ref 11.5–15.5)
WBC: 7.8 10*3/uL (ref 4.0–10.5)

## 2019-08-23 LAB — PROTIME-INR
INR: 7 ratio (ref 0.8–1.0)
Prothrombin Time: 79.4 s (ref 9.6–13.1)

## 2019-08-23 NOTE — Telephone Encounter (Signed)
Will call [facility] 08/24/19 since they are now closed

## 2019-08-23 NOTE — Telephone Encounter (Signed)
Called the patient back to make her aware of the order being placed and that it is needed to help determine her oxygen needs at night. Patient stated she currently has therapy services at the home by [facility]. Advised her the order placed will ask if [facility] can do this. Patient voiced understanding. Nothing further needed.

## 2019-08-23 NOTE — Telephone Encounter (Signed)
Message below routed to Rexene Edison, NP  Tammy,   This patient had a video visit with you yesterday due to asthmatic bronchitis and pneumonia. She is scheduled to have a chest CT at [facility] today at 1:00.  The patient stated that ever since she was taken off oxygen a year ago she has been getting respiratory infections. She wants to know if she can be placed back on O2 at home. She sleeps with her head elevated and cannot sleep in a chair.  She stated she has done everything you directed yesterday during video visit and is not getting better. I told her that once you get the results from the Chest CT that will help determine if there are any additional needs.

## 2019-08-23 NOTE — Telephone Encounter (Signed)
We can order Overnight oximetry test to check for desats while sleeping

## 2019-08-23 NOTE — Telephone Encounter (Signed)
I have called the patient.I explained to her that her INR was 7.0. I have told  her to take no further coumadin until she has spoken with the coumadin clinic.Additionally I have told her not to take any prednisone until she has spoken with the Coumadin clinic in the morning. She takes her Coumadin at night, and will hold tonight's dose.  We had attempted to call the pharmacist at the Coumadin clinic, but they were closed. A voice mail was left for the pharmacist. I explained to Katherine [name] that her blood was dangerously thin, and that for any trauma or bleeding she needs to seek emergency care. I told the patient  that the patient needs to call the coumadin clinic as early as possible in the morning to  get recommendations for her coumadin therapy and possible treatment of her elevated INR and how to monitor closely over the next several days.I also advised the patient that eating a green leafy salad and going to the store and getting   [place] green tea both can contribute to decreasing INR. These are things she can do at home. .  She verbalized understanding of the above. She understands that she should seek emergency care for bleeding she cannot control with pressure, and for any trauma she should go to the ED.   Magdalen Spatz, MSN, AGACNP-BC [place] Pager # [phone] After 4 pm please call [phone] 08/23/2019 5:56 PM

## 2019-08-23 NOTE — Telephone Encounter (Signed)
Received call report from Santiago Glad at the [place] lab  Patient had video visit with TP yesterday 1/5 and came to the office today for labs (BMET, PT/INR, CBCD)  Patient's PT = 79.4, INR = 7.0  Patient is on coumadin 10mg  daily per the coumadin clinic.  Last visit with the coumadin clinic was 03/2019  Called the coumadin clinic to let pharmacist know and receive recommendations but they are closed and had to LM.  In the meantime, routing to APP of the day Sarah NP.

## 2019-08-24 ENCOUNTER — Ambulatory Visit (INDEPENDENT_AMBULATORY_CARE_PROVIDER_SITE_OTHER): Payer: Medicare Other | Admitting: Internal Medicine

## 2019-08-24 ENCOUNTER — Encounter: Payer: Self-pay | Admitting: Internal Medicine

## 2019-08-24 ENCOUNTER — Ambulatory Visit (INDEPENDENT_AMBULATORY_CARE_PROVIDER_SITE_OTHER): Payer: Medicare Other | Admitting: Cardiovascular Disease

## 2019-08-24 ENCOUNTER — Telehealth: Payer: Self-pay | Admitting: General Surgery

## 2019-08-24 VITALS — BP 152/86 | HR 81 | Temp 98.0°F | Resp 16 | Ht 61.0 in | Wt 235.0 lb

## 2019-08-24 DIAGNOSIS — Z5181 Encounter for therapeutic drug level monitoring: Secondary | ICD-10-CM | POA: Diagnosis not present

## 2019-08-24 DIAGNOSIS — J4530 Mild persistent asthma, uncomplicated: Secondary | ICD-10-CM | POA: Diagnosis not present

## 2019-08-24 DIAGNOSIS — J411 Mucopurulent chronic bronchitis: Secondary | ICD-10-CM | POA: Diagnosis not present

## 2019-08-24 DIAGNOSIS — J189 Pneumonia, unspecified organism: Secondary | ICD-10-CM | POA: Diagnosis not present

## 2019-08-24 MED ORDER — BROVANA 15 MCG/2ML IN NEBU: 15.0000 ug | INHALATION_SOLUTION | Freq: Two times a day (BID) | RESPIRATORY_TRACT | 1 refills | Status: DC

## 2019-08-24 MED ORDER — LONHALA MAGNAIR STARTER KIT 25 MCG/ML IN SOLN: 1.0000 | Freq: Two times a day (BID) | RESPIRATORY_TRACT | 1 refills | Status: DC

## 2019-08-24 MED ORDER — BUDESONIDE 0.5 MG/2ML IN SUSP: 2.0000 mL | Freq: Two times a day (BID) | RESPIRATORY_TRACT | 1 refills | Status: DC

## 2019-08-24 MED ORDER — LONHALA MAGNAIR REFILL KIT 25 MCG/ML IN SOLN: 1.0000 | Freq: Two times a day (BID) | RESPIRATORY_TRACT | 1 refills | Status: DC

## 2019-08-24 NOTE — Telephone Encounter (Signed)
Pt scheduled for f/u 1/13 with RB. Nothing further needed.

## 2019-08-24 NOTE — Patient Instructions (Signed)
Description    Called and spoke to pt and asked her to hold coumadin on 1/7, 1/8 and 1/9  and take 1/2 a tablet on  1/10. Coumadin clinic [phone] for any cocnerns. Check INR on 1/11 at coumadin clinic for further dosing instructions. Seek medical attention if you notice any bleeding.

## 2019-08-24 NOTE — Telephone Encounter (Signed)
Received a call from [facility] with [facility]. She wanted to let clinic know the patient INR was 7. They have her scheduled for a recheck on 08/28/19.

## 2019-08-24 NOTE — Telephone Encounter (Signed)
Called [facility] back and spoke with another rep. She states the ONO order needs to specify the ONO needs to be done on RA. New order placed and old order has been canceled. Nothing further needed at this time.

## 2019-08-24 NOTE — Progress Notes (Signed)
Subjective:  Patient ID: Katherine [name], female    DOB: 1955/09/28  Age: 64 y.o. MRN: [id number]  CC: Cough  This visit occurred during the SARS-CoV-2 public health emergency.  Safety protocols were in place, including screening questions prior to the visit, additional usage of staff PPE, and extensive cleaning of exam room while observing appropriate contact time as indicated for disinfecting solutions.   HPI Katherine [name] presents for hosp f/up - She was recently admitted and treated for pneumonia.  She continues to complain of respiratory symptoms.  She tells me she is using her Symbicort inhaler but she complains that most of the medicine deposits in her mouth.  She continues to complain of cough that is productive of mostly white phlegm but it is occasionally brown or green.  She also has shortness of breath and wheezing.  She tells me she has been using her albuterol inhaler quite a bit.  Recommendations for Outpatient Follow-up:  1. Follow-up with your PCP.  2. Take your medications as prescribed. 3. Continue PT OT with home health PT OT with assistance and fall precautions.  Discharge Diagnoses:       Active Hospital Problems   Diagnosis Date Noted   HCAP (healthcare-associated pneumonia) 08/08/2019   Sepsis (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    Discharge Condition: Stable    Outpatient Medications Prior to Visit  Medication Sig Dispense Refill   albuterol (PROVENTIL) (2.5 MG/3ML) 0.083% nebulizer solution Take 3 mLs (2.5 mg total) by nebulization every  6 (six) hours as needed for wheezing or shortness of breath (dx: J45.20). 75 mL 2   azaTHIOprine (IMURAN) 50 MG tablet Take 125 mg by mouth every evening. Taking 2 & 1/2 tabs ('125mg'$ ) qd     calcitRIOL (ROCALTROL) 0.25 MCG capsule Take 0.25 mcg by mouth every 3 (three) days.      carbamazepine (TEGRETOL) 200 MG tablet Take 1 tablet (200 mg total) by mouth 2 (two) times daily. 180 tablet 0   clotrimazole (MYCELEX) 10 MG troche Take 1 tablet (10 mg total) by mouth 5 (five) times daily. 35 tablet 0   cyclobenzaprine (FLEXERIL) 10 MG tablet TAKE 1 TABLET BY MOUTH 3 TIMES DAILY AS NEEDED FOR MUSCLE SPASMS (Patient taking differently: Take 10 mg by mouth 2 (two) times daily. ) 90 tablet 2   diltiazem (CARDIZEM CD) 120 MG 24 hr capsule TAKE 1 CAPSULE BY MOUTH EVERY DAY 30 capsule 0   dofetilide (TIKOSYN) 250 MCG capsule TAKE 1 CAPSULE BY MOUTH 2 (TWO) TIMES DAILY. (Patient taking differently: Take 250 mcg by mouth 2 (two) times daily. TAKE 1 CAPSULE BY MOUTH 2 (TWO) TIMES DAILY.) 60 capsule 6   DULoxetine (CYMBALTA) 30 MG capsule TAKE 2 CAPSULES (60 MG TOTAL) BY MOUTH EVERY MORNING. (Patient taking differently: Take 60 mg by mouth daily. ) 180 capsule 2   famotidine (PEPCID) 20 MG tablet Take 20 mg by mouth daily.     ferrous sulfate 325 (65 FE) MG tablet Take 1 tablet (325 mg total) by mouth daily with  breakfast. 60 tablet 0   fluticasone (FLONASE) 50 MCG/ACT nasal spray PLACE 2 SPRAYS INTO BOTH NOSTRILS 2 TIMES DAILY (Patient taking differently: Place 2 sprays into both nostrils 2 (two) times daily. ) 48 mL 3   glimepiride (AMARYL) 4 MG tablet Take 4 mg by mouth every morning.  6   guaiFENesin (MUCINEX) 600 MG 12 hr tablet Take 1 tablet (600 mg total) by mouth 2 (two) times daily. 30 tablet 0   Insulin Glargine (BASAGLAR KWIKPEN) 100 UNIT/ML SOPN Inject 12 Units into the skin daily.     LORazepam (ATIVAN) 1 MG tablet Take 1 mg by mouth daily as needed for anxiety. Takes daily     metFORMIN  (GLUCOPHAGE) 500 MG tablet Take 500 mg by mouth 2 (two) times daily.  6   metoprolol tartrate (LOPRESSOR) 25 MG tablet TAKE 1 TABLET BY MOUTH TWICE A DAY (Patient taking differently: Take 25 mg by mouth 2 (two) times daily. ) 180 tablet 0   montelukast (SINGULAIR) 10 MG tablet TAKE 1 TABLET BY MOUTH EVERY DAY (Patient taking differently: Take 10 mg by mouth daily. TAKE 1 TABLET BY MOUTH EVERY DAY) 90 tablet 1   nitroGLYCERIN (NITROSTAT) 0.4 MG SL tablet Place 1 tablet (0.4 mg total) under the tongue every 5 (five) minutes as needed for chest pain. 25 tablet 6   NOVOLOG FLEXPEN 100 UNIT/ML FlexPen Inject 4 Units into the skin 2 (two) times daily after a meal.   6   Oxycodone HCl 10 MG TABS Take 1 tablet (10 mg total) by mouth every 8 (eight) hours as needed. 60 tablet 0   predniSONE (DELTASONE) 5 MG tablet Take 5 mg by mouth daily.     promethazine (PHENERGAN) 25 MG tablet Take 25 mg by mouth every 6 (six) hours as needed for nausea. Or for sleep     rosuvastatin (CRESTOR) 20 MG tablet TAKE 1 TABLET BY MOUTH EVERY DAY (Patient taking differently: Take 20 mg by mouth daily. ) 90 tablet 3   sertraline (ZOLOFT) 100 MG tablet Take 200 mg by mouth daily.     sodium bicarbonate 650 MG tablet Take 1,300 mg by mouth 2 (two) times daily.     VENTOLIN HFA 108 (90 Base) MCG/ACT inhaler TAKE 2 PUFFS BY MOUTH EVERY 6 HOURS AS NEEDED FOR WHEEZE OR SHORTNESS OF BREATH (Patient taking differently: Inhale 2 puffs into the lungs every 6 (six) hours as needed for wheezing or shortness of breath. ) 18 g 5   warfarin (COUMADIN) 10 MG tablet TAKE 1 TABLET DAILY OR AS DIRECTED BY COUMADIN CLINIC (Patient taking differently: Take 10 mg by mouth daily at 6 PM. Take 1 tablet daily or as directed by Coumadin clinic) 100 tablet 1   budesonide (PULMICORT) 0.5 MG/2ML nebulizer solution Take 2 mLs by nebulization 2 (two) times daily.     SYMBICORT 160-4.5 MCG/ACT inhaler TAKE 2 PUFFS BY MOUTH TWICE A DAY (Patient taking  differently: Inhale 2 puffs into the lungs 2 (two) times daily. ) 30.6 Inhaler 1   No facility-administered medications prior to visit.    ROS Review of Systems  Constitutional: Positive for fatigue. Negative for appetite change, chills, diaphoresis, fever and unexpected weight change.  HENT: Negative for sore throat, trouble swallowing and voice change.   Eyes: Negative.   Respiratory: Positive for cough, shortness of breath and wheezing. Negative for chest tightness and stridor.   Cardiovascular: Negative for chest pain, palpitations and leg swelling.  Gastrointestinal: Negative for abdominal pain,  anal bleeding, blood in stool, diarrhea, nausea and vomiting.  Endocrine: Negative.   Genitourinary: Negative.  Negative for difficulty urinating and hematuria.  Musculoskeletal: Negative.   Skin: Negative.   Neurological: Negative.  Negative for dizziness, weakness and light-headedness.  Hematological: Negative for adenopathy. Does not bruise/bleed easily.  Psychiatric/Behavioral: Negative.     Objective:  BP (!) 152/86 (BP Location: Left Arm, Patient Position: Sitting, Cuff Size: Large)    Pulse 81    Temp 98 F (36.7 C) (Oral)    Resp 16    Ht '5\' 1"'$  (1.549 m)    Wt 235 lb (106.6 kg)    SpO2 91%    BMI 44.40 kg/m   BP Readings from Last 3 Encounters:  08/24/19 (!) 152/86  08/13/19 131/65  07/28/19 (!) 145/71    Wt Readings from Last 3 Encounters:  08/24/19 235 lb (106.6 kg)  08/13/19 236 lb 8.9 oz (107.3 kg)  07/27/19 235 lb 7.2 oz (106.8 kg)    Physical Exam Vitals reviewed.  Constitutional:      General: She is not in acute distress.    Appearance: She is obese. She is not ill-appearing, toxic-appearing or diaphoretic.  HENT:     Nose: Nose normal.     Mouth/Throat:     Mouth: Mucous membranes are moist.     Pharynx: No posterior oropharyngeal erythema.  Eyes:     General: No scleral icterus.    Conjunctiva/sclera: Conjunctivae normal.  Cardiovascular:     Rate  and Rhythm: Normal rate and regular rhythm.     Heart sounds: No murmur.  Pulmonary:     Effort: Pulmonary effort is normal. No tachypnea, accessory muscle usage or respiratory distress.     Breath sounds: No stridor. No wheezing, rhonchi or rales.  Abdominal:     General: Abdomen is protuberant. Bowel sounds are normal. There is no distension.     Palpations: Abdomen is soft. There is no hepatomegaly or splenomegaly.     Tenderness: There is no abdominal tenderness.  Musculoskeletal:        General: Normal range of motion.     Cervical back: Neck supple.     Right lower leg: No edema.     Left lower leg: No edema.  Lymphadenopathy:     Cervical: No cervical adenopathy.  Skin:    General: Skin is warm.  Neurological:     General: No focal deficit present.     Mental Status: She is alert.  Psychiatric:        Mood and Affect: Mood normal.        Behavior: Behavior normal.     Lab Results  Component Value Date   WBC 7.8 08/23/2019   HGB 8.3 Repeated and verified X2. (L) 08/23/2019   HCT 26.1 (L) 08/23/2019   PLT 247.0 08/23/2019   GLUCOSE 217 (H) 08/23/2019   CHOL 111 11/30/2017   TRIG 129.0 11/30/2017   HDL 53.90 11/30/2017   LDLCALC 32 11/30/2017   ALT 11 08/12/2019   AST 18 08/12/2019   NA 135 08/23/2019   K 4.5 08/23/2019   CL 103 08/23/2019   CREATININE 0.77 08/23/2019   BUN 17 08/23/2019   CO2 24 08/23/2019   TSH 3.118 04/20/2018   INR 7.0 Repeated and verified X2. (HH) 08/23/2019   HGBA1C 6.6 (H) 07/23/2019   MICROALBUR 30 11/30/2017    CT Chest Wo Contrast  Result Date: 08/23/2019 CLINICAL DATA:  Persistent asthmatic bronchitis and healthcare associated pneumonia.  EXAM: CT CHEST WITHOUT CONTRAST TECHNIQUE: Multidetector CT imaging of the chest was performed following the standard protocol without IV contrast. COMPARISON:  06/18/2017 chest CT. FINDINGS: Cardiovascular: Mild cardiomegaly. No significant pericardial effusion/thickening. Three-vessel coronary  atherosclerosis status post CABG. Atherosclerotic nonaneurysmal thoracic aorta. Stable top-normal caliber main pulmonary artery (3.0 cm diameter). Duplicated SVC. Mediastinum/Nodes: Coarse subcentimeter calcifications scattered in the visualized thyroid bilaterally, unchanged. Unremarkable esophagus. No pathologically enlarged axillary, mediastinal or hilar lymph nodes, noting limited sensitivity for the detection of hilar adenopathy on this noncontrast study. Lungs/Pleura: No pneumothorax. Trace dependent bilateral pleural effusions. Mosaic attenuation throughout both lungs. Mild patchy ground-glass opacity throughout both lungs, most prominent in the right upper and right lower lobes. No acute consolidative airspace disease, lung masses or significant pulmonary nodules. No significant regions of bronchiectasis. Scattered mild regions of interlobular septal thickening in both lungs. Upper abdomen: Well-positioned gastric band in the gastric cardia region with intact appearing visualized tube vein. Cholecystectomy. Musculoskeletal: No aggressive appearing focal osseous lesions. Stable discontinuities in the 3 lower most sternotomy wires. Marked thoracic spondylosis. IMPRESSION: 1. Mild cardiomegaly. 2. Trace dependent bilateral pleural effusions. 3. Mosaic attenuation throughout both lungs, nonspecific, differential includes mosaic perfusion from pulmonary vascular disease versus air trapping from small airways disease. 4. Nonspecific mild patchy ground-glass opacity throughout both lungs with scattered mild interlobular septal thickening. Differential is broad and includes mild cardiogenic pulmonary edema, drug toxicity, atypical infection and hypersensitivity pneumonitis. 5. Duplicated SVC. Aortic Atherosclerosis (ICD10-I70.0). Electronically Signed   By: Ilona Sorrel M.D.   On: 08/23/2019 13:46    Assessment & Plan:   Taeja was seen today for cough.  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 -     budesonide (PULMICORT) 0.5 MG/2ML nebulizer solution; Take 2 mLs (0.5 mg total) by nebulization 2 (two) times daily.  HCAP (healthcare-associated pneumonia)- I will recheck her chest x-ray to evaluate whether or not the pneumonia is resolving. -     DG Chest 2 View; Future  Chronic bronchitis, mucopurulent ([place])- She continues to be symptomatic and it sounds like she can't use the hand-held inhaler.  I recommended that she upgrade to a nebulized LAMA/LABA/and ICS. -     Glycopyrrolate (LONHALA MAGNAIR REFILL KIT) 25 MCG/ML SOLN; Inhale 1 puff into the lungs 2 (two) times daily. -     Glycopyrrolate (LONHALA MAGNAIR STARTER KIT) 25 MCG/ML SOLN; Inhale 1 puff into the lungs 2 (two) times daily. -     budesonide (PULMICORT) 0.5 MG/2ML nebulizer solution; Take 2 mLs (0.5 mg total) by nebulization 2 (two) times daily. -     arformoterol (BROVANA) 15 MCG/2ML NEBU; Take 2 mLs (15 mcg total) by nebulization 2 (two) times daily.   I have discontinued Katherine [name]'s Symbicort. I have also changed her budesonide. Additionally, I am having her start on [employer] Kit, [employer], and [employer]. Lastly, I am having her maintain her azaTHIOprine, calcitRIOL, nitroGLYCERIN, glimepiride, metFORMIN, famotidine, cyclobenzaprine, Basaglar KwikPen, NovoLOG FlexPen, guaiFENesin, rosuvastatin, clotrimazole, warfarin, albuterol, Ventolin HFA, fluticasone, dofetilide, DULoxetine, montelukast, carbamazepine, LORazepam, sertraline, sodium bicarbonate, promethazine, predniSONE, Oxycodone HCl, metoprolol tartrate, ferrous sulfate, and diltiazem.  Meds ordered this encounter  Medications   Glycopyrrolate (LONHALA MAGNAIR REFILL KIT) 25 MCG/ML SOLN    Sig: Inhale 1 puff into the lungs 2 (two) times daily.    Dispense:  180 mL    Refill:  1  Glycopyrrolate (LONHALA MAGNAIR STARTER KIT)  25 MCG/ML SOLN    Sig: Inhale 1 puff into the lungs 2 (two) times daily.    Dispense:  60 mL    Refill:  1   budesonide (PULMICORT) 0.5 MG/2ML nebulizer solution    Sig: Take 2 mLs (0.5 mg total) by nebulization 2 (two) times daily.    Dispense:  360 mL    Refill:  1   arformoterol (BROVANA) 15 MCG/2ML NEBU    Sig: Take 2 mLs (15 mcg total) by nebulization 2 (two) times daily.    Dispense:  360 mL    Refill:  1     Follow-up: Return if symptoms worsen or fail to improve.  Scarlette Calico, MD

## 2019-08-24 NOTE — Patient Instructions (Signed)
Chronic Obstructive Pulmonary Disease  Chronic obstructive pulmonary disease (COPD) is a long-term (chronic) condition that affects the lungs. COPD is a general term that can be used to describe many different lung problems that cause lung swelling (inflammation) and limit airflow, including chronic bronchitis and emphysema. If you have COPD, your lung function will probably never return to normal. In most cases, it gets worse over time. However, there are steps you can take to slow the progression of the disease and improve your quality of life. What are the causes? This condition may be caused by:  Smoking. This is the most common cause.  Certain genes passed down through families. What increases the risk? The following factors may make you more likely to develop this condition:  Secondhand smoke from cigarettes, pipes, or cigars.  Exposure to chemicals and other irritants such as fumes and dust in the work environment.  Chronic lung conditions or infections. What are the signs or symptoms? Symptoms of this condition include:  Shortness of breath, especially during physical activity.  Chronic cough with a large amount of thick mucus. Sometimes the cough may not have any mucus (dry cough).  Wheezing.  Rapid breaths.  Gray or bluish discoloration (cyanosis) of the skin, especially in your fingers, toes, or lips.  Feeling tired (fatigue).  Weight loss.  Chest tightness.  Frequent infections.  Episodes when breathing symptoms become much worse (exacerbations).  Swelling in the ankles, feet, or legs. This may occur in later stages of the disease. How is this diagnosed? This condition is diagnosed based on:  Your medical history.  A physical exam. You may also have tests, including:  Lung (pulmonary) function tests. This may include a spirometry test, which measures your ability to exhale properly.  Chest X-ray.  CT scan.  Blood tests. How is this treated? This  condition may be treated with:  Medicines. These may include inhaled rescue medicines to treat acute exacerbations as well as long-term, or maintenance, medicines to prevent flare-ups of COPD. ? Bronchodilators help treat COPD by dilating the airways to allow increased airflow and make your breathing more comfortable. ? Steroids can reduce airway inflammation and help prevent exacerbations.  Smoking cessation. If you smoke, your health care provider may ask you to quit, and may also recommend therapy or replacement products to help you quit.  Pulmonary rehabilitation. This may involve working with a team of health care providers and specialists, such as respiratory, occupational, and physical therapists.  Exercise and physical activity. These are beneficial for nearly all people with COPD.  Nutrition therapy to gain weight, if you are underweight.  Oxygen. Supplemental oxygen therapy is only helpful if you have a low oxygen level in your blood (hypoxemia).  Lung surgery or transplant.  Palliative care. This is to help people with COPD feel comfortable when treatment is no longer working. Follow these instructions at home: Medicines  Take over-the-counter and prescription medicines (inhaled or pills) only as told by your health care provider.  Talk to your health care provider before taking any cough or allergy medicines. You may need to avoid certain medicines that dry out your airways. Lifestyle  If you are a smoker, the most important thing that you can do is to stop smoking. Do not use any products that contain nicotine or tobacco, such as cigarettes and e-cigarettes. If you need help quitting, ask your health care provider. Continuing to smoke will cause the disease to progress faster.  Avoid exposure to things that irritate your   lungs, such as smoke, chemicals, and fumes.  Stay active, but balance activity with periods of rest. Exercise and physical activity will help you maintain  your ability to do things you want to do.  Learn and use relaxation techniques to manage stress and to control your breathing.  Get the right amount of sleep and get quality sleep. Most adults need 7 or more hours per night.  Eat healthy foods. Eating smaller, more frequent meals and resting before meals may help you maintain your strength. Controlled breathing Learn and use controlled breathing techniques as directed by your health care provider. Controlled breathing techniques include:  Pursed lip breathing. Start by breathing in (inhaling) through your nose for 1 second. Then, purse your lips as if you were going to whistle and breathe out (exhale) through the pursed lips for 2 seconds.  Diaphragmatic breathing. Start by putting one hand on your abdomen just above your waist. Inhale slowly through your nose. The hand on your abdomen should move out. Then purse your lips and exhale slowly. You should be able to feel the hand on your abdomen moving in as you exhale. Controlled coughing Learn and use controlled coughing to clear mucus from your lungs. Controlled coughing is a series of short, progressive coughs. The steps of controlled coughing are: 1. Lean your head slightly forward. 2. Breathe in deeply using diaphragmatic breathing. 3. Try to hold your breath for 3 seconds. 4. Keep your mouth slightly open while coughing twice. 5. Spit any mucus out into a tissue. 6. Rest and repeat the steps once or twice as needed. General instructions  Make sure you receive all the vaccines that your health care provider recommends, especially the pneumococcal and influenza vaccines. Preventing infection and hospitalization is very important when you have COPD.  Use oxygen therapy and pulmonary rehabilitation if directed to by your health care provider. If you require home oxygen therapy, ask your health care provider whether you should purchase a pulse oximeter to measure your oxygen level at  home.  Work with your health care provider to develop a COPD action plan. This will help you know what steps to take if your condition gets worse.  Keep other chronic health conditions under control as told by your health care provider.  Avoid extreme temperature and humidity changes.  Avoid contact with people who have an illness that spreads from person to person (is contagious), such as viral infections or pneumonia.  Keep all follow-up visits as told by your health care provider. This is important. Contact a health care provider if:  You are coughing up more mucus than usual.  There is a change in the color or thickness of your mucus.  Your breathing is more labored than usual.  Your breathing is faster than usual.  You have difficulty sleeping.  You need to use your rescue medicines or inhalers more often than expected.  You have trouble doing routine activities such as getting dressed or walking around the house. Get help right away if:  You have shortness of breath while you are resting.  You have shortness of breath that prevents you from: ? Being able to talk. ? Performing your usual physical activities.  You have chest pain lasting longer than 5 minutes.  Your skin color is more blue (cyanotic) than usual.  You measure low oxygen saturations for longer than 5 minutes with a pulse oximeter.  You have a fever.  You feel too tired to breathe normally. Summary  Chronic obstructive   pulmonary disease (COPD) is a long-term (chronic) condition that affects the lungs.  Your lung function will probably never return to normal. In most cases, it gets worse over time. However, there are steps you can take to slow the progression of the disease and improve your quality of life.  Treatment for COPD may include taking medicines, quitting smoking, pulmonary rehabilitation, and changes to diet and exercise. As the disease progresses, you may need oxygen therapy, a lung  transplant, or palliative care.  To help manage your condition, do not smoke, avoid exposure to things that irritate your lungs, stay up to date on all vaccines, and follow your health care provider's instructions for taking medicines. This information is not intended to replace advice given to you by your health care provider. Make sure you discuss any questions you have with your health care provider. Document Revised: 07/16/2017 Document Reviewed: 09/07/2016 Elsevier Patient Education  2020 [employer]  

## 2019-08-24 NOTE — Telephone Encounter (Signed)
Patient feeling some better. Seen by PCP today  No known bleeding  Prednisone is helping some  Follow up with Dr. Lamonte Sakai  Next week , discuss CT at visit .  Please contact office for sooner follow up if symptoms do not improve or worsen or seek emergency care

## 2019-08-26 ENCOUNTER — Other Ambulatory Visit: Payer: Self-pay | Admitting: Cardiology

## 2019-08-28 ENCOUNTER — Telehealth: Payer: Self-pay | Admitting: Emergency Medicine

## 2019-08-28 NOTE — Telephone Encounter (Signed)
Spoke with the pt  She states that she used to have o2 at home but no longer has this  She is willing to come to the office but she currently has acute symptoms of prod cough and fever  Please advise what to do thanks

## 2019-08-28 NOTE — Telephone Encounter (Signed)
Does she have o2 at home?  She needs a repeat oximetry at rest and w exertion so I can document and order new O2 - will probably need to be done here. She has a video visit planned for Wednesday. Can we get her here to do the oximetry?

## 2019-08-28 NOTE — Telephone Encounter (Signed)
OK- appt changed to in person visit  Pt aware to wear mask

## 2019-08-28 NOTE — Telephone Encounter (Signed)
Her described symptoms have been going on for over a month, and she has tested negative for SARSCoV2 three times in the last month, last was 2 weeks ago. Several rounds of abx given. I believe she should be able to come for walking oximetry - needs to wear a mask as per our usual precautions. I have an OV with her on Wednesday to review everything together.

## 2019-08-28 NOTE — Telephone Encounter (Signed)
I called and spoke with the patient and she states that since Saturday 08/26/19 and has had increased sob, productive cough with brown colored sputum, low grade fever(99.8), and low oxygen stats. She states that on Saturday she checked her oxygen and it was 71% and she was sitting on the side of the bed. She said since then its been in the 80's. She has been taking tylenol for her fever, using her nebulizer and inhalers and using her flutter device to help. Please advise.

## 2019-08-30 ENCOUNTER — Telehealth: Payer: Self-pay | Admitting: Internal Medicine

## 2019-08-30 ENCOUNTER — Telehealth: Payer: Self-pay

## 2019-08-30 ENCOUNTER — Ambulatory Visit (INDEPENDENT_AMBULATORY_CARE_PROVIDER_SITE_OTHER): Payer: Medicare Other | Admitting: Emergency Medicine

## 2019-08-30 ENCOUNTER — Encounter: Payer: Self-pay | Admitting: Emergency Medicine

## 2019-08-30 DIAGNOSIS — J4530 Mild persistent asthma, uncomplicated: Secondary | ICD-10-CM | POA: Diagnosis not present

## 2019-08-30 MED ORDER — ARFORMOTEROL TARTRATE 15 MCG/2ML IN NEBU: 15.0000 ug | INHALATION_SOLUTION | Freq: Two times a day (BID) | RESPIRATORY_TRACT | 12 refills | Status: DC

## 2019-08-30 NOTE — Telephone Encounter (Signed)
Copied from [place] [phone]. Topic: Quick Communication - Home Health Verbal Orders >> Aug 30, 2019  9:34 AM Virl Axe D wrote: Caller/Agency: Stanton Kidney, RN/Advanced Callback Number: [phone]/YBOFBP VM Requesting OT/PT/Skilled Nursing/Social Work/Speech Therapy: Nursing Frequency: 1 week 9 / 2 PRN visits

## 2019-08-30 NOTE — Telephone Encounter (Signed)
Copied from [place] [id number]. Topic: General - Other >> Aug 30, 2019  4:13 PM Keene Breath wrote: Reason for CRM: Calling to get prior auth for Glycopyrrolate [facility] REFILL KIT) 25 MCG/ML SOLN.  Please advise and call back at [phone]

## 2019-08-30 NOTE — Progress Notes (Signed)
Virtual Visit via Telephone Note  I connected with Daryle Boyington on 08/30/19 at  1:30 PM EST by a telemedicine application and verified that I am speaking with the correct person using two identifiers.  Location: Patient: Home  Provider: Office   I discussed the limitations of evaluation and management by telemedicine and the availability of in person appointments. The patient expressed understanding and agreed to proceed.  History of Present Illness:  ROV 08/30/2019 --Ms. Brents is a 64 year old complex patient with a history of obesity post lap band surgery in the past, formally OSA off CPAP, history of ESRD and renal transplant on chronic Imuran suppression with Imuran and prednisone currently 5 mg.  She has obstructive lung disease, asthmatic bronchitis in the setting of allergic rhinitis with an FEV1 1.6 L by most recent spirometry.  She has GERD which also contributes to chronic cough, on omeprazole and Pepcid.  Her course has been characterized by frequent exacerbations over the last year.  She was treated with antibiotics and prednisone a few times in the spring and then has had 2 hospitalizations for dyspnea, purulent sputum in December.  She was treated for possible pneumonia on those occasions.  Also note that she had grade 2 diastolic dysfunction on echocardiogram 12/7, underwent diuresis which was beneficial.  She has been found to have hypoxemia, has seen desaturations at home.  Formally on supplemental oxygen at home but this had been discontinued with improvement in her saturations.  A CT scan of her chest was done on 08/24/2019 that I have reviewed, shows scattered bilateral groundglass changes and interstitial prominence, etiology nonspecific but consider cardiogenic edema, air trapping, etc.    Observations/Objective: She reports that her breathing is doing fairly well today. She has seen desaturations - as low as the 70's when she was laying supine. She is using symbicort bid,  is now doing budesonide nebs bid. Uses albuterol nebs prn, averages about 2x a day. She does get benefit.  She is coughing up mucous daily, brown, thick.   Remains on Singulair, prednisone 5, Imuran   CT chest 08/23/2019: COMPARISON:  06/18/2017 chest CT.  FINDINGS: Cardiovascular: Mild cardiomegaly. No significant pericardial effusion/thickening. Three-vessel coronary atherosclerosis status post CABG. Atherosclerotic nonaneurysmal thoracic aorta. Stable top-normal caliber main pulmonary artery (3.0 cm diameter). Duplicated SVC.  Mediastinum/Nodes: Coarse subcentimeter calcifications scattered in the visualized thyroid bilaterally, unchanged. Unremarkable esophagus. No pathologically enlarged axillary, mediastinal or hilar lymph nodes, noting limited sensitivity for the detection of hilar adenopathy on this noncontrast study.  Lungs/Pleura: No pneumothorax. Trace dependent bilateral pleural effusions. Mosaic attenuation throughout both lungs. Mild patchy ground-glass opacity throughout both lungs, most prominent in the right upper and right lower lobes. No acute consolidative airspace disease, lung masses or significant pulmonary nodules. No significant regions of bronchiectasis. Scattered mild regions of interlobular septal thickening in both lungs.  Upper abdomen: Well-positioned gastric band in the gastric cardia region with intact appearing visualized tube vein. Cholecystectomy.  Musculoskeletal: No aggressive appearing focal osseous lesions. Stable discontinuities in the 3 lower most sternotomy wires. Marked thoracic spondylosis.  IMPRESSION: 1. Mild cardiomegaly. 2. Trace dependent bilateral pleural effusions. 3. Mosaic attenuation throughout both lungs, nonspecific, differential includes mosaic perfusion from pulmonary vascular disease versus air trapping from small airways disease. 4. Nonspecific mild patchy ground-glass opacity throughout both lungs with  scattered mild interlobular septal thickening. Differential is broad and includes mild cardiogenic pulmonary edema, drug toxicity, atypical infection and hypersensitivity pneumonitis. 5. Duplicated SVC.  Assessment and Plan:  Extrinsic  asthma, frequent exacerbations We will arrange for a walking oximetry  Add brovana to budesonide nebs; once started will stop the symbicort Lonhala was added for her by Dr Ronnald Ramp, but she doesn't have it yet. Will hold off, consider adding depending on response to the above  Bilateral groundglass infiltrates, question inflammatory versus diastolic dysfunction and volume overload.  She is immunosuppressed for her renal transplant and there is some risk for opportunistic infection.  May need to consider sputum sample or even bronchoscopy if flaring symptoms continue.  Chronic diastolic CHF due to HTN and A Fib She would probably benefit from diuresis, but hesitant to do so without reviewing with with Dr Deterding. Will plan to discuss w him and the patient in quick follow up   Chronic cough with allergic rhinitis and GERD as contributors Continue her maintenance meds for GERD and rhinitis  History of obstructive sleep apnea, off CPAP. Split-night sleep study discussed earlier this year but has not been done. She is willing to reconsider. Will revisit w her  Chronic renal insufficiency   Coagulopathy on Coumadin Being managed by the anticoagulation clinic at [facility]; she has f/u with them on 1/14   Follow Up Instructions: 1-2 weeks virtual visit   I discussed the assessment and treatment plan with the patient. The patient was provided an opportunity to ask questions and all were answered. The patient agreed with the plan and demonstrated an understanding of the instructions.   The patient was advised to call back or seek an in-person evaluation if the symptoms worsen or if the condition fails to improve as anticipated.  I provided 24 minutes of  non-face-to-face time during this encounter.   Collene Gobble, MD

## 2019-08-30 NOTE — Telephone Encounter (Addendum)
Key: [id number] (starter) - this one has been started  Key: [id number] (refills)

## 2019-08-30 NOTE — Telephone Encounter (Signed)
lvm with Stanton Kidney with verbal okay for nursing and prn visits as requested.

## 2019-08-31 ENCOUNTER — Telehealth: Payer: Self-pay | Admitting: Emergency Medicine

## 2019-08-31 ENCOUNTER — Ambulatory Visit (INDEPENDENT_AMBULATORY_CARE_PROVIDER_SITE_OTHER): Payer: Medicare Other | Admitting: Interventional Cardiology

## 2019-08-31 ENCOUNTER — Telehealth: Payer: Self-pay | Admitting: Pharmacist

## 2019-08-31 DIAGNOSIS — I480 Paroxysmal atrial fibrillation: Secondary | ICD-10-CM

## 2019-08-31 DIAGNOSIS — Z7901 Long term (current) use of anticoagulants: Secondary | ICD-10-CM | POA: Diagnosis not present

## 2019-08-31 LAB — POCT INR: INR: 0.9 — AB (ref 2.0–3.0)

## 2019-08-31 NOTE — Telephone Encounter (Signed)
Spoke with the pt  She is asking if she should space out her brovana and budesonide nebs a bit or take them back to back  Or would it be ok if she mixes them and take them together at the same time? Please advise thanks

## 2019-08-31 NOTE — Telephone Encounter (Signed)
Pt states she is feeling SOB and doesn't think she can make her INR appt. She states she will call [facility] to see if they can come visit her today and check her INR. Provided her with our direct # to call with any issues and for [facility] RN to call with INR result. She asked for [place] to keep her in-office appt scheduled for now while she coordinates this.

## 2019-08-31 NOTE — Telephone Encounter (Signed)
Called to inform pt of same and she stated that Dr. Lamonte Sakai told her to not start the [place]. I informed pt that Dr. Lamonte Sakai should take over all the medications for her lungs.   I have dc'ed the Lonhala and noted why it was dc'ed.

## 2019-08-31 NOTE — Telephone Encounter (Signed)
It is ok for her to mix them

## 2019-08-31 NOTE — Telephone Encounter (Addendum)
Received call from [facility], [facility] RN - provided her with verbal order to check INR this afternoon.  Pt interested in changing to Eliquis. Unfortunately she takes Tegretol which is contraindicated with Eliquis, Xarelto, and Pradaxa. [facility] would be an option except her CrCl is 195mL/min and Savaysa is contraindicated with CrCl > 23mL/min.  Pt plans to discuss with her PCP if there is another alternative to Tegretol.

## 2019-08-31 NOTE — Telephone Encounter (Signed)
Yes, each of these meds works differently  [place]

## 2019-08-31 NOTE — Telephone Encounter (Addendum)
PA approved. Pt contacted and informed PA was approved.   Pt stated that her lung called in [place].  Pt is still taking the pulmicort neb. Should pt start the lonhala and brovana?

## 2019-08-31 NOTE — Patient Instructions (Signed)
Description   Spoke with Stanton Kidney, RN with [facility] while in home with pt. Advised her to restart warfarin and take 15mg  today and tomorrow, then resume taking 10mg  daily except 5mg  on Sundays. Recheck INR on 1/20. She is checking with PCP if Tegretol can be changed to alternative medication as she is interested in taking Eliquis but there is a contraindication between the two.

## 2019-08-31 NOTE — Telephone Encounter (Signed)
Spoke with the pt and notified of recs per Dr Lamonte Sakai  She verbalized understanding

## 2019-09-04 DIAGNOSIS — J449 Chronic obstructive pulmonary disease, unspecified: Secondary | ICD-10-CM | POA: Diagnosis not present

## 2019-09-04 DIAGNOSIS — R0902 Hypoxemia: Secondary | ICD-10-CM | POA: Diagnosis not present

## 2019-09-07 ENCOUNTER — Ambulatory Visit (INDEPENDENT_AMBULATORY_CARE_PROVIDER_SITE_OTHER): Payer: Medicare Other | Admitting: *Deleted

## 2019-09-07 ENCOUNTER — Ambulatory Visit: Payer: Medicare Other

## 2019-09-07 DIAGNOSIS — Z7901 Long term (current) use of anticoagulants: Secondary | ICD-10-CM

## 2019-09-07 DIAGNOSIS — I480 Paroxysmal atrial fibrillation: Secondary | ICD-10-CM

## 2019-09-07 LAB — POCT INR: INR: 4.3 — AB (ref 2.0–3.0)

## 2019-09-07 NOTE — Patient Instructions (Signed)
Description   Spoke with Stanton Kidney, RN with [facility] while in home with pt and advised her to hold today today's dose and take 5mg  tomorrow then start taking 10mg  daily except 5mg  on Sunday and Thursdays. Recheck INR on Wednesday before taking dose. She is checking with PCP if Tegretol can be changed to alternative medication as she is interested in taking Eliquis but there is a contraindication between the two.

## 2019-09-11 ENCOUNTER — Telehealth: Payer: Self-pay | Admitting: Cardiology

## 2019-09-11 ENCOUNTER — Telehealth: Payer: Self-pay | Admitting: Emergency Medicine

## 2019-09-11 NOTE — Telephone Encounter (Signed)
Left message to call back  

## 2019-09-11 NOTE — Telephone Encounter (Signed)
Mary, with [facility], is calling to inquire about medication changes for the patient. Please call to discuss.

## 2019-09-11 NOTE — Telephone Encounter (Signed)
Called and spoke with pt to see what her O2 sats were and pt said this morning her O2 sats were in the 70s this morning. Pt said since then, they have been staying in the 80s  Pt said that she was told this morning by Lauren that she should go to the ER but pt said that she did not want to go. I also stated to pt if she was having O2 sats this low that she should go to the ER for evaluation but pt said she did not want to go to the ER as she has no one that could watch her dog for her.  Stated to pt that we would call her back as soon as we heard from [place] and she verbalized understanding. Tammy, please advise.

## 2019-09-11 NOTE — Telephone Encounter (Signed)
Pt calling back regarding her oxygen level dropping.  Please advise. [phone].

## 2019-09-11 NOTE — Telephone Encounter (Addendum)
Called and spoke with pt stating to her the info per TP that it was highly recommended for her to got to the ER. Pt still did not want to go to the ER and then I stated to pt that we needed to get her in to the office for an appt tomorrow 1/26 for evaluation and check to see if she will qualify for oxygen. Pt stated that she has a televisit scheduled with RB 1/28 and a qualifying walk appt scheduled on 1/29 that she does not want to change ad RB is her actual doctor. Pt stated that she does not want to see an APP for the appt.  Pt stated that she wanted to have message sent to [place] himself for recommendations as she said she said she qualified for oxygen on a sleep test and the results were sent to office in attn to TP. Pt was aware that Dr. Lamonte Sakai would be back in office 1/26.  Dr. Lamonte Sakai, please advise. Patient would like for you to call her to further discuss. Thanks!

## 2019-09-11 NOTE — Telephone Encounter (Signed)
Please let patient know ER is best place for evaluation and we highly recommend. If she refuses.  Lets set up for ONO to evaluate need for oxygen .  Needs in office visit for evauation and check for oxygen so we can get started on oxygen . Please get in office visit tomorrow for evaluation   Please contact office for sooner follow up if symptoms do not improve or worsen or seek emergency care

## 2019-09-11 NOTE — Telephone Encounter (Signed)
Primary Pulmonologist: [place] Last office visit and with whom: 08/30/19-televisit What do we see them for (pulmonary problems): asthma Last OV assessment/plan: "Assessment and Plan:  Extrinsic asthma, frequent exacerbations We will arrange for a walking oximetry  Add brovana to budesonide nebs; once started will stop the symbicort Lonhala was added for her by Dr Ronnald Ramp, but she doesn't have it yet. Will hold off, consider adding depending on response to the above  Bilateral groundglass infiltrates, question inflammatory versus diastolic dysfunction and volume overload.  She is immunosuppressed for her renal transplant and there is some risk for opportunistic infection.  May need to consider sputum sample or even bronchoscopy if flaring symptoms continue.  Chronic diastolic CHF due to HTN and A Fib She would probably benefit from diuresis, but hesitant to do so without reviewing with with Dr Deterding. Will plan to discuss w him and the patient in quick follow up   Chronic cough with allergic rhinitis and GERD as contributors Continue her maintenance meds for GERD and rhinitis  History of obstructive sleep apnea, off CPAP. Split-night sleep study discussed earlier this year but has not been done. She is willing to reconsider. Will revisit w her  Chronic renal insufficiency   Coagulopathy on Coumadin Being managed by the anticoagulation clinic at [facility]; she has f/u with them on 1/14"  Was appointment offered to patient (explain)?     Reason for call: patient woke up with sats of 69% at 4am, she did her albuterol inhaler-5am still couldn't get to sleep felt like she was gasping. Patient does not have oxygen at home. Patient current O2 sats while sitting in chair are 87-91%. Patient does not want to go to ER.  TP please advise.  (examples of things to ask: : When did symptoms start? Fever? Cough? Productive? Color to sputum? More sputum than usual? Wheezing? Have you needed  increased oxygen? Are you taking your respiratory medications? What over the counter measures have you tried?)

## 2019-09-11 NOTE — Telephone Encounter (Signed)
Spoke with HHN and discussed 2 issues.  1) Would like to change from Warfarin to anticoag that doesn't require PT/INR  2) She has had a lot of stress. She lost her husband to cancer, sister to cancer, and niece to [place] all since April. HHN would like for patient to be referred to Hospice grief counselor   Will forward to [name] for review

## 2019-09-13 ENCOUNTER — Telehealth: Payer: Self-pay

## 2019-09-13 ENCOUNTER — Ambulatory Visit (INDEPENDENT_AMBULATORY_CARE_PROVIDER_SITE_OTHER): Payer: Medicare Other | Admitting: Cardiology

## 2019-09-13 DIAGNOSIS — Z7901 Long term (current) use of anticoagulants: Secondary | ICD-10-CM | POA: Diagnosis not present

## 2019-09-13 DIAGNOSIS — I480 Paroxysmal atrial fibrillation: Secondary | ICD-10-CM | POA: Diagnosis not present

## 2019-09-13 LAB — POCT INR: INR: 3.5 — AB (ref 2.0–3.0)

## 2019-09-13 NOTE — Telephone Encounter (Signed)
Please refill.

## 2019-09-13 NOTE — Telephone Encounter (Signed)
Katherine [name] with [facility] - [phone] (may leave a secure VM if needed)  INR and PT results  INR: 3.5 PT:  41

## 2019-09-13 NOTE — Telephone Encounter (Signed)
Attempted to call patient, no answer 

## 2019-09-13 NOTE — Patient Instructions (Signed)
Description   Spoke with Amy, RN with [facility] while in home with pt and advised her to hold today today's dose then start taking 10mg  daily except 5mg  on Sunday, Tuesday, and Thursdays. Recheck INR in 1 week. She is checking with PCP if Tegretol can be changed to alternative medication as she is interested in taking Eliquis but there is a contraindication between the two.

## 2019-09-13 NOTE — Telephone Encounter (Signed)
So this is a difficult situation.  She is listed as my primary patient, but I have not seen her since February 2019.  I did a heart cath in September 2019 but have not seen her since.  Her A. fib is been followed in the A. fib clinic by Roderic Palau and Dr. Rayann Heman.  I would be peripherally fine switching her to a DOAC, would probably choose Eliquis 5 mg twice daily.  We can probably have our Coumadin clinic people handle this.  As for referral to hospice grief counseling with home health I would prefer to have it being done by her PCP.  [name], MD

## 2019-09-13 NOTE — Telephone Encounter (Signed)
This is not a refill request, home health called INR to wrong practice. Called Amy back and addressed INR result, see separate anticoag encounter today for details.

## 2019-09-14 ENCOUNTER — Other Ambulatory Visit: Payer: Self-pay

## 2019-09-14 ENCOUNTER — Encounter: Payer: Self-pay | Admitting: Emergency Medicine

## 2019-09-14 ENCOUNTER — Ambulatory Visit (INDEPENDENT_AMBULATORY_CARE_PROVIDER_SITE_OTHER): Payer: Medicare Other | Admitting: Emergency Medicine

## 2019-09-14 DIAGNOSIS — D509 Iron deficiency anemia, unspecified: Secondary | ICD-10-CM | POA: Diagnosis not present

## 2019-09-14 DIAGNOSIS — J449 Chronic obstructive pulmonary disease, unspecified: Secondary | ICD-10-CM | POA: Diagnosis not present

## 2019-09-14 DIAGNOSIS — K219 Gastro-esophageal reflux disease without esophagitis: Secondary | ICD-10-CM | POA: Diagnosis not present

## 2019-09-14 DIAGNOSIS — E1121 Type 2 diabetes mellitus with diabetic nephropathy: Secondary | ICD-10-CM | POA: Diagnosis not present

## 2019-09-14 DIAGNOSIS — I2510 Atherosclerotic heart disease of native coronary artery without angina pectoris: Secondary | ICD-10-CM | POA: Diagnosis not present

## 2019-09-14 DIAGNOSIS — I0981 Rheumatic heart failure: Secondary | ICD-10-CM | POA: Diagnosis not present

## 2019-09-14 DIAGNOSIS — J189 Pneumonia, unspecified organism: Secondary | ICD-10-CM | POA: Diagnosis not present

## 2019-09-14 DIAGNOSIS — J440 Chronic obstructive pulmonary disease with acute lower respiratory infection: Secondary | ICD-10-CM | POA: Diagnosis not present

## 2019-09-14 DIAGNOSIS — Z7952 Long term (current) use of systemic steroids: Secondary | ICD-10-CM | POA: Diagnosis not present

## 2019-09-14 DIAGNOSIS — M797 Fibromyalgia: Secondary | ICD-10-CM | POA: Diagnosis not present

## 2019-09-14 DIAGNOSIS — F418 Other specified anxiety disorders: Secondary | ICD-10-CM | POA: Diagnosis not present

## 2019-09-14 DIAGNOSIS — E785 Hyperlipidemia, unspecified: Secondary | ICD-10-CM | POA: Diagnosis not present

## 2019-09-14 DIAGNOSIS — Z7901 Long term (current) use of anticoagulants: Secondary | ICD-10-CM | POA: Diagnosis not present

## 2019-09-14 DIAGNOSIS — I6523 Occlusion and stenosis of bilateral carotid arteries: Secondary | ICD-10-CM | POA: Diagnosis not present

## 2019-09-14 DIAGNOSIS — I5032 Chronic diastolic (congestive) heart failure: Secondary | ICD-10-CM | POA: Diagnosis not present

## 2019-09-14 DIAGNOSIS — I480 Paroxysmal atrial fibrillation: Secondary | ICD-10-CM | POA: Diagnosis not present

## 2019-09-14 DIAGNOSIS — Z794 Long term (current) use of insulin: Secondary | ICD-10-CM | POA: Diagnosis not present

## 2019-09-14 DIAGNOSIS — E1151 Type 2 diabetes mellitus with diabetic peripheral angiopathy without gangrene: Secondary | ICD-10-CM | POA: Diagnosis not present

## 2019-09-14 DIAGNOSIS — M069 Rheumatoid arthritis, unspecified: Secondary | ICD-10-CM | POA: Diagnosis not present

## 2019-09-14 DIAGNOSIS — A419 Sepsis, unspecified organism: Secondary | ICD-10-CM | POA: Diagnosis not present

## 2019-09-14 DIAGNOSIS — G4733 Obstructive sleep apnea (adult) (pediatric): Secondary | ICD-10-CM | POA: Diagnosis not present

## 2019-09-14 DIAGNOSIS — I083 Combined rheumatic disorders of mitral, aortic and tricuspid valves: Secondary | ICD-10-CM | POA: Diagnosis not present

## 2019-09-14 DIAGNOSIS — I110 Hypertensive heart disease with heart failure: Secondary | ICD-10-CM | POA: Diagnosis not present

## 2019-09-14 DIAGNOSIS — Z6841 Body Mass Index (BMI) 40.0 and over, adult: Secondary | ICD-10-CM | POA: Diagnosis not present

## 2019-09-14 DIAGNOSIS — Z940 Kidney transplant status: Secondary | ICD-10-CM | POA: Diagnosis not present

## 2019-09-14 NOTE — Telephone Encounter (Signed)
Will forward to Geroge Baseman PA who saw patient last and pharm D for review   Advised HHN to contact PCP regarding grief counseling

## 2019-09-14 NOTE — Telephone Encounter (Signed)
Spoke with her today at [place]

## 2019-09-14 NOTE — Telephone Encounter (Signed)
I have previously discussed [place] therapy with pt - unfortunately they are contraindicated with her carbamazepine medication. She planned to follow up with her PCP to see if there was an alternative medication she would take that would not interact with Eliquis.

## 2019-09-14 NOTE — Progress Notes (Signed)
Virtual Visit via Telephone Note  I connected with Katherine [name] on 09/14/19 at  3:30 PM EST by telephone and verified that I am speaking with the correct person using two identifiers.  Location: Patient: Home Provider: office    I discussed the limitations, risks, security and privacy concerns of performing an evaluation and management service by telephone and the availability of in person appointments. I also discussed with the patient that there may be a patient responsible charge related to this service. The patient expressed understanding and agreed to proceed.   History of Present Illness: 64 year old woman history of obesity, prior lap band surgery, formally on CPAP for OSA but no longer. Also end-stage renal disease post functional renal transplant on Imuran, low-dose prednisone, hypertension with grade 2 diastolic dysfunction and episodic volume overload. She has obstructive lung disease and asthmatic bronchitis characterized by frequent exacerbations, chronic cough due to this and also GERD, chronic rhinitis.   Observations/Objective: She has continued to identify intermittent episodes of desaturation with exertion, first noticed when she was hospitalized in December, treated for pneumonia, AE-COPD and also exacerbation of CHF. An overnight oximetry was done on 09/04/2019 that I reviewed. She spent 4 hours and 56 minutes with saturations less than 88%. This would typically qualify her for supplemental oxygen but because she carries a history of OSA it has not yet been approved. Last time I added Brovana to pulmicort. She has benefited from the addition. Her cough is worst when she does her nebs. She is on flonase, singulair.   Assessment and Plan: Continue Brovana + Pulmicort nebs. Consider adding Lonhala or alternative LAMA at some point going forward depending on clinical status.   Walking oximetry tomorrow. If she desats w exertion then we will start O2. She would also need to  wear at night. Ultimately she will likely need a repeat PSG to determine whether there is any residual OSA (or whether it has resolved).     Follow Up Instructions: 1 month   I discussed the assessment and treatment plan with the patient. The patient was provided an opportunity to ask questions and all were answered. The patient agreed with the plan and demonstrated an understanding of the instructions.   The patient was advised to call back or seek an in-person evaluation if the symptoms worsen or if the condition fails to improve as anticipated.  I provided 15 minutes of non-face-to-face time during this encounter.   Collene Gobble, MD

## 2019-09-15 ENCOUNTER — Other Ambulatory Visit: Payer: Self-pay | Admitting: Cardiology

## 2019-09-15 ENCOUNTER — Ambulatory Visit: Payer: Medicare Other

## 2019-09-18 ENCOUNTER — Other Ambulatory Visit: Payer: Self-pay | Admitting: Physical Medicine & Rehabilitation

## 2019-09-18 DIAGNOSIS — F329 Major depressive disorder, single episode, unspecified: Secondary | ICD-10-CM

## 2019-09-18 NOTE — Telephone Encounter (Signed)
Left message to call back  

## 2019-09-18 NOTE — Telephone Encounter (Signed)
Recieved electrotonic medication refill request for Zoloft.  No mention of this medication in last few notes.  Unsure if ok to refill this medication.  please advise.

## 2019-09-20 ENCOUNTER — Ambulatory Visit: Payer: Medicare Other

## 2019-09-20 ENCOUNTER — Telehealth: Payer: Self-pay | Admitting: Cardiology

## 2019-09-20 ENCOUNTER — Ambulatory Visit (INDEPENDENT_AMBULATORY_CARE_PROVIDER_SITE_OTHER): Payer: Medicare Other | Admitting: Cardiology

## 2019-09-20 DIAGNOSIS — Z7901 Long term (current) use of anticoagulants: Secondary | ICD-10-CM

## 2019-09-20 DIAGNOSIS — I480 Paroxysmal atrial fibrillation: Secondary | ICD-10-CM | POA: Diagnosis not present

## 2019-09-20 LAB — POCT INR: INR: 2.6 (ref 2.0–3.0)

## 2019-09-20 NOTE — Telephone Encounter (Signed)
See below.  Thank you

## 2019-09-20 NOTE — Telephone Encounter (Signed)
New Message    INR 2.6  Takes Coumadin according to Orders    Please Advise

## 2019-09-20 NOTE — Telephone Encounter (Signed)
Will make an anticoag encounter

## 2019-09-21 NOTE — Telephone Encounter (Signed)
This encounter was created in error - please disregard.

## 2019-09-21 NOTE — Telephone Encounter (Signed)
Nurse called in for dosing and upset that the pt wasn't dosed last night and it caused the pt to miss last nights dose because she was unaware of what to do. The anticoag envounter is located in the cc'd charts under [facility] for a pharmd to dose will route to raquel pharmd cpp

## 2019-09-22 ENCOUNTER — Ambulatory Visit: Payer: Medicare Other

## 2019-09-22 NOTE — Telephone Encounter (Signed)
Katherine [name], verbalized understanding

## 2019-09-25 ENCOUNTER — Other Ambulatory Visit: Payer: Self-pay

## 2019-09-25 ENCOUNTER — Ambulatory Visit (INDEPENDENT_AMBULATORY_CARE_PROVIDER_SITE_OTHER): Payer: Medicare Other

## 2019-09-25 DIAGNOSIS — J4489 Other specified chronic obstructive pulmonary disease: Secondary | ICD-10-CM

## 2019-09-25 DIAGNOSIS — J4530 Mild persistent asthma, uncomplicated: Secondary | ICD-10-CM | POA: Diagnosis not present

## 2019-09-25 DIAGNOSIS — J449 Chronic obstructive pulmonary disease, unspecified: Secondary | ICD-10-CM

## 2019-09-25 NOTE — Progress Notes (Signed)
Patient came to office for a qualifying walk. Patient did qualify for oxygen. Order has been placed for the O2.

## 2019-09-27 ENCOUNTER — Ambulatory Visit (INDEPENDENT_AMBULATORY_CARE_PROVIDER_SITE_OTHER): Payer: Medicare Other | Admitting: Cardiovascular Disease

## 2019-09-27 ENCOUNTER — Other Ambulatory Visit: Payer: Self-pay | Admitting: Emergency Medicine

## 2019-09-27 ENCOUNTER — Telehealth: Payer: Self-pay | Admitting: Cardiology

## 2019-09-27 DIAGNOSIS — Z7901 Long term (current) use of anticoagulants: Secondary | ICD-10-CM

## 2019-09-27 DIAGNOSIS — I480 Paroxysmal atrial fibrillation: Secondary | ICD-10-CM | POA: Diagnosis not present

## 2019-09-27 LAB — POCT INR: INR: 2.6 (ref 2.0–3.0)

## 2019-09-27 NOTE — Telephone Encounter (Signed)
Katherine [name], with [facility], is calling stating that she would like to make Dr. Ellyn Hack aware that the patient's INR is 2.6 on today, 09/27/19. Please call to discuss.

## 2019-09-27 NOTE — Telephone Encounter (Signed)
Set up an encounter for billing

## 2019-09-29 ENCOUNTER — Telehealth: Payer: Self-pay | Admitting: Physical Medicine & Rehabilitation

## 2019-09-29 DIAGNOSIS — M797 Fibromyalgia: Secondary | ICD-10-CM

## 2019-09-29 MED ORDER — OXYCODONE HCL 10 MG PO TABS: 10.0000 mg | ORAL_TABLET | Freq: Three times a day (TID) | ORAL | 0 refills | Status: DC | PRN

## 2019-09-29 NOTE — Telephone Encounter (Signed)
Patient has an appt on 2/17 with Dr. Naaman Plummer but her medication will run out on 2/15.  Would like a refill on the oxycodone, please call patient.

## 2019-09-29 NOTE — Telephone Encounter (Signed)
PMP was Reviewed: Last Oxycodone was filled on 08/01/2019.  Last Office visit was on 04/21/2019 Dr. Naaman Plummer filled Katherine [name] prescription in January she was recently discharged from hospital with pneumonia.   Katherine [name] is aware she must keep her scheduled appointment with Dr Naaman Plummer on 10/04/2019, or we will not be able to refill her oxycodone without a office visit, she verbalizes understanding. April Marshall spoke with Katherine [name] regarding the above per this provider's request.

## 2019-10-01 ENCOUNTER — Other Ambulatory Visit: Payer: Self-pay | Admitting: Cardiology

## 2019-10-04 ENCOUNTER — Encounter: Payer: Self-pay | Admitting: Physical Medicine & Rehabilitation

## 2019-10-04 ENCOUNTER — Other Ambulatory Visit: Payer: Self-pay

## 2019-10-04 ENCOUNTER — Encounter: Payer: Medicare Other | Attending: Physical Medicine & Rehabilitation | Admitting: Physical Medicine & Rehabilitation

## 2019-10-04 ENCOUNTER — Ambulatory Visit (INDEPENDENT_AMBULATORY_CARE_PROVIDER_SITE_OTHER): Payer: Medicare Other | Admitting: Pharmacist

## 2019-10-04 DIAGNOSIS — Z7901 Long term (current) use of anticoagulants: Secondary | ICD-10-CM | POA: Diagnosis not present

## 2019-10-04 DIAGNOSIS — I480 Paroxysmal atrial fibrillation: Secondary | ICD-10-CM | POA: Diagnosis not present

## 2019-10-04 DIAGNOSIS — M797 Fibromyalgia: Secondary | ICD-10-CM

## 2019-10-04 LAB — POCT INR: INR: 5.1 — AB (ref 2.0–3.0)

## 2019-10-04 MED ORDER — OXYCODONE HCL 10 MG PO TABS: 10.0000 mg | ORAL_TABLET | Freq: Three times a day (TID) | ORAL | 0 refills | Status: DC | PRN

## 2019-10-04 NOTE — Progress Notes (Signed)
Subjective:    Patient ID: Katherine [name], female    DOB: May 25, 1956, 64 y.o.   MRN: [id number]  HPI   Due to national recommendations of social distancing because of COVID 22, an audio/video tele-health visit is felt to be the most appropriate encounter for this patient at this time. See MyChart message from today for the patient's consent to a tele-health encounter with [place]. This is a follow up telephone visit for the patient who is at home. MD is at office.    She has struggled with ongoing medical issues including pneumonia and recurrent pneumonia. She is on oxygen currently, 2.5 L most of the day. She has a nurse coming to house to check on her oxygen. She is also working with [facility]. She is having a lot of GERD symptoms but states she has been drinking a lot of OJ. She stopped taking her pepcid as well.   She tries to get up and move around when she can, but she's not been as regular lately given her medical issues.   Katherine [name] remains on oxycodone for pain relief as well as cymbalta, tegretol, and flexeril.    Pain Inventory Average Pain 7 Pain Right Now 6 My pain is constant, sharp, burning, dull, stabbing, tingling and aching  In the last 24 hours, has pain interfered with the following? General activity 8 Relation with others 9 Enjoyment of life 10 What TIME of day is your pain at its worst? morning Sleep (in general) Good  Pain is worse with: walking, standing and some activites Pain improves with: medication Relief from Meds: 8  Mobility walk without assistance ability to climb steps?  yes do you drive?  yes  Function disabled: date disabled .  Neuro/Psych weakness numbness tingling trouble walking dizziness depression anxiety  Prior Studies Any changes since last visit?  no  Physicians involved in your care Any changes since last visit?  no   Family History  Problem Relation Age of Onset  . Cancer  Mother        liver  . Heart disease Father   . Cancer Father        colon  . Arrhythmia Brother        Atrial Fibrillation  . Arrhythmia Paternal Aunt        Atrial Fibrillation   Social History   Socioeconomic History  . Marital status: Widowed    Spouse name: Not on file  . Number of children: Not on file  . Years of education: Not on file  . Highest education level: Not on file  Occupational History  . Not on file  Tobacco Use  . Smoking status: Former Smoker    Packs/day: 1.00    Years: 30.00    Pack years: 30.00    Types: Cigarettes    Quit date: 08/17/2002    Years since quitting: 17.1  . Smokeless tobacco: Never Used  Substance and Sexual Activity  . Alcohol use: No  . Drug use: No  . Sexual activity: Not on file  Other Topics Concern  . Not on file  Social History Narrative   She is currently married, and the caregiver of her husband who is recovering from surgery for tongue cancer now diagnosed with lung cancer. Prior to his diagnosis of her husband, she actually had adopted a 31-year-old child who she knows caring for as well. With all the surgeries, they have been quite financially troubled. Thanks the  help of her community and church, they have been able to stay "alfoat."     She is a former smoker who quit in 2004 after a 30-pack-year history.   She is active chasing a 53-year-old child, does not do routine exercise. She's been quite depressed with the condition of her husband, and admits to eating comfort herself.   She does not drink alcohol.   Social Determinants of Health   Financial Resource Strain:   . Difficulty of Paying Living Expenses: Not on file  Food Insecurity:   . Worried About Charity fundraiser in the Last Year: Not on file  . Ran Out of Food in the Last Year: Not on file  Transportation Needs:   . Lack of Transportation (Medical): Not on file  . Lack of Transportation (Non-Medical): Not on file  Physical Activity:   . Days of Exercise  per Week: Not on file  . Minutes of Exercise per Session: Not on file  Stress:   . Feeling of Stress : Not on file  Social Connections:   . Frequency of Communication with Friends and Family: Not on file  . Frequency of Social Gatherings with Friends and Family: Not on file  . Attends Religious Services: Not on file  . Active Member of Clubs or Organizations: Not on file  . Attends Archivist Meetings: Not on file  . Marital Status: Not on file   Past Surgical History:  Procedure Laterality Date  . ABDOMINAL AORTOGRAM N/A 04/21/2018   Procedure: ABDOMINAL AORTOGRAM;  Surgeon: [name], MD;  Location: [facility];  Service: Cardiovascular;  Laterality: N/A;  . CARDIAC CATHETERIZATION  5/'01, 3/'02, 8/'03, 10/'04; 1/'15   08/22/2013: LAD & RCA 100%; LIMA-LAD & SVG-rPDA patent; Cx-- OM1 60%, OM2 ostial ~50%; SVG-D1 - 80% mid, 50% distal ISR --PCI  . CATHETER REMOVAL    . CHOLECYSTECTOMY N/A 10/29/2014   Procedure: LAPAROSCOPIC CHOLECYSTECTOMY WITH INTRAOPERATIVE CHOLANGIOGRAM;  Surgeon: Excell Seltzer, MD;  Location: [facility];  Service: General;  Laterality: N/A;  . CORONARY ANGIOPLASTY  1994   x5  . CORONARY ARTERY BYPASS GRAFT  1995   LIMA-LAD, SVG-RPDA, SVG-D1  . ESOPHAGOGASTRODUODENOSCOPY N/A 10/15/2016   Procedure: ESOPHAGOGASTRODUODENOSCOPY (EGD);  Surgeon: Wilford Corner, MD;  [facility];  Service: Endoscopy;  Laterality: N/A;  . I & D EXTREMITY Right 01/29/2018   Procedure: IRRIGATION AND DEBRIDEMENT THUMB;  Surgeon: Dayna Barker, MD;  Location: [facility];  Service: Plastics;  Laterality: Right;  . INCISE AND DRAIN ABCESS    . KIDNEY TRANSPLANT  1991  . KNEE ARTHROSCOPY WITH LATERAL MENISECTOMY Left 12/03/2017   Procedure: LEFT KNEE ARTHROSCOPY WITH LATERAL MENISECTOMY;  Surgeon: Earlie Server, MD;  Location: [facility];  Service: Orthopedics;  Laterality: Left;  . LAPAROSCOPIC GASTRIC BANDING  04/2004; 10/'09, 2/'10   Port Replacement x 2  . LEFT  HEART CATH AND CORS/GRAFTS ANGIOGRAPHY N/A 04/21/2018   Procedure: LEFT HEART CATH AND CORS/GRAFTS ANGIOGRAPHY;  Surgeon: [name], MD;  Location: [facility];  Service: Cardiovascular;  Laterality: N/A;  . LEFT HEART CATHETERIZATION WITH CORONARY/GRAFT ANGIOGRAM N/A 08/23/2013   Procedure: LEFT HEART CATHETERIZATION WITH Beatrix Fetters;  Surgeon: Wellington Hampshire, MD;  Location: [facility] LAB;  Service: Cardiovascular;  Laterality: N/A;  . Lower Extremity Arterial Dopplers  08/2013   ABI: R 0.96, L 1.04  . MULTIPLE TOOTH EXTRACTIONS  age 26  . NM MYOVIEW LTD  03/2016   EF 62%. LOW  RISK. C/W prior MI - no Ischemia. Apical hypokinesis.  [name] Kitchen PERCUTANEOUS CORONARY STENT INTERVENTION (PCI-S)  5/'01, 3/'02, 8/'03, 10/'04;   '01 - S660 BMS 2.5 x 9 - dSVG-D1 into D1; '02- post-stent stenosis - 2.5 x 8 Pixel BMS; '8\03: ISR/Thrombosis into native D1 - AngioJet, 2.5 x 13 Pixel; '04 - ISR 95% - covered stented area with Taxus DES 2.5 mm x 20 (2.88)  . PERCUTANEOUS CORONARY STENT INTERVENTION (PCI-S)  08/23/2013   Procedure: PERCUTANEOUS CORONARY STENT INTERVENTION (PCI-S);  Surgeon: Wellington Hampshire, MD;  Location: [facility] CATH LAB;  Service: Cardiovascular;;mid SVG-D1 80%; distal stent ~50% ISR; Promus Prermier DES 2.75 mm xc 20 mm (2.8 mm)  . PORT-A-CATH REMOVAL     kidney  . TRANSTHORACIC ECHOCARDIOGRAM  02/2016   EF 55-60%. Septal dyssynergy from CABG GR 2 DD. Aortic valve is trileaflet the functional bicuspid with sclerosis but not yet notable for stenosis stenosis.; no Significant change  . TUBAL LIGATION    . wrist fistula repair Left    dialysis for one year   Past Medical History:  Diagnosis Date  . Anemia   . Anxiety   . Bilateral carotid artery stenosis    Carotid duplex 0/3500: 9-38% LICA, 18-29% RICA, >93% RECA, f/u 1 yr suggested  . CAD (coronary artery disease) of bypass graft 5/01; 3/'02, 8/'03, 10/'04; 1/15   PCI x 5 to SVG-D1   . CAD in native artery 07/1993    3 Vessel  Disease (LAD-D1 & RCA) -- CABG  . CAD S/P percutaneous coronary angioplasty    PCI to SVG-D1 insertion/native D1 x 4 = '01 -(S660 BMS 2.5 x 9 - insertion into D1; '02 - distal overlap ACS Pixel 2.5 x 8  BMS; '03 distal/native ISR/Thrombosis - Pixel 2.5 x 13; '04 - ISR-  Taxus 2.5 x 20 (covered all);; 1/15 - mid SVG-D1 (50% distal ISR) - Promus P 2.75 x 20 -- 2.8 mm  . COPD mixed type ([place])    Followed by Dr. Lamonte Sakai "pulmonologist said no COPD"  . Depression   . Depression with anxiety   . Diabetes mellitus type 2 in obese ([place])   . Diarrhea    started after cholecystectomy and mass removed from intestine  . Dyslipidemia, goal LDL below 70    08/2012: TC 137, TG 200, HDL 32!, LDL 45; on statin (followed by Dr.Deterding)  . ESRD (end stage renal disease) ([place]) 1991   s/p Cadaveric Renal Transplant [facility] - Dr. Jimmy Footman)   . Family history of adverse reaction to anesthesia    mom's bp dropped during/after anesthesia  . Fibromyalgia   . GERD (gastroesophageal reflux disease)   . Glomerulonephritis, chronic, rapidly progressive 66  . 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 07/1993   Rescue PTCA of RCA -- referred for CABG.  . H/O: GI bleed   . Headache    migraines in the past  . History of kidney stones   . Hypertension associated with diabetes ([place])   . Morbid obesity ([place])   . MRSA (methicillin resistant staph aureus) culture positive   . OSA (obstructive sleep apnea)    no longer on CPAP or home O2, states she doesn't need now after lap band  . PAD (peripheral artery disease) ([place]) 08/2013   LEA Dopplers to be read by Dr. Fletcher Anon  . PAF (paroxysmal atrial fibrillation) ([place]) 06/2014   Noted on CardioNet Monitor  - rate ~112; no recurrent symptoms. Nonionic regulation because of frequent  GI procedures. Patient prefers Plavix  . Pneumonia   . Recurrent boils    Bilateral Groin  . Rheumatoid arthritis ([place])    Per Patient Report; associated with OA  .  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 Servando Snare: LIMA-LAD, SVG-bifurcatingD1, SVG-rPDA  . S/p cadaver renal transplant [place]  . Stroke [facility]) 2012   "right eye stroke- half blind now"  . Unstable angina ([place]) 5/01; 3/'02, 8/'03, 10/'04; 1/15   x 5 occurences since Inf-Post STEMI in 1994   There were no vitals taken for this visit.  Opioid Risk Score:   Fall Risk Score:  `1  Depression screen PHQ 2/9  Depression screen [facility] 2/9 11/02/2018 07/04/2018  Decreased Interest 3 3  Down, Depressed, Hopeless 3 3  PHQ - 2 Score 6 6  Altered sleeping 1 3  Tired, decreased energy 1 3  Change in appetite 1 2  Feeling bad or failure about yourself  0 3  Trouble concentrating 0 2  Moving slowly or fidgety/restless 0 0  Suicidal thoughts 0 0  PHQ-9 Score 9 19  Difficult doing work/chores - Very difficult  Some recent data might be hidden    Review of Systems  Constitutional: Positive for chills and fever.  HENT: Positive for congestion and sore throat.   Eyes: Negative.   Respiratory: Positive for shortness of breath.   Cardiovascular: Negative.   Gastrointestinal: Negative.   Genitourinary: Negative.   Musculoskeletal: Positive for arthralgias, back pain, gait problem, neck pain and neck stiffness.  Skin: Negative.   Neurological: Positive for dizziness, weakness and numbness.       Tingling  Hematological: Negative.   Psychiatric/Behavioral: Positive for dysphoric mood. The patient is nervous/anxious.        Objective:   Physical Exam  n/a      Assessment & Plan:  1. Fibromyalgia:    - Continue Tegretol and Cymbalta.   -encouraged OOB, exercise as tolerated especially given her recent pulmonary issues 2. Rheumatoid Arthritis: Refilled: Oxycodone 10mg  one tablet every 8 hours as needed for severe pain #60.    We will continue the controlled substance monitoring program, this consists of regular clinic visits, examinations, routine drug screening, pill counts as well as use of [place] Controlled Substance Reporting System. [place] today.   3. Osteoarthritis--polyarticular:   -Continue with current medication regime and continue to monitor.  4. ESRD with hx of renal transplant on chronic steroids:   -Nephrology following 5. Right rotator cuff tendonitis/like degenerative/inflammatory changes in the right shoulder also.   -maintain HEP as possible 6. GERD:  -resume pepcid  -cut back on OJ.   7. Depression:   -Continue current medication regimen. Continue to monitor.   8. Lumbar Radiculitis S/P  Left 405 Transformanial ESI with relief noted. 9.Bilateral Hip Pain:   -recommend HEP   -heat and ice 10. Type 2 DM with Peripheral Neuropathy  -tegretol  12 minutes of tele-visit time was spent with this patient today. Follow up in 2 months with NP

## 2019-10-06 DIAGNOSIS — R399 Unspecified symptoms and signs involving the genitourinary system: Secondary | ICD-10-CM | POA: Diagnosis not present

## 2019-10-11 ENCOUNTER — Telehealth: Payer: Self-pay | Admitting: Cardiology

## 2019-10-11 NOTE — Telephone Encounter (Signed)
Starla from [facility] is calling stating the patient Katherine [name] refused her INR today and requested to have it performed tomorrow. Lavella Hammock states she needs a verbal order in order to do this. Please advise

## 2019-10-11 NOTE — Telephone Encounter (Signed)
Called and spoke  w/nurse and gave verbal orders as requested

## 2019-10-12 IMAGING — DX DG CHEST 2V
2 series · 2 of 2 positions shown · non-contrast
Comparison: 07/04/2018

CLINICAL DATA: Productive cough, wheezing and shortness of breath.

EXAM:
CHEST - 2 VIEW

[chest pa]
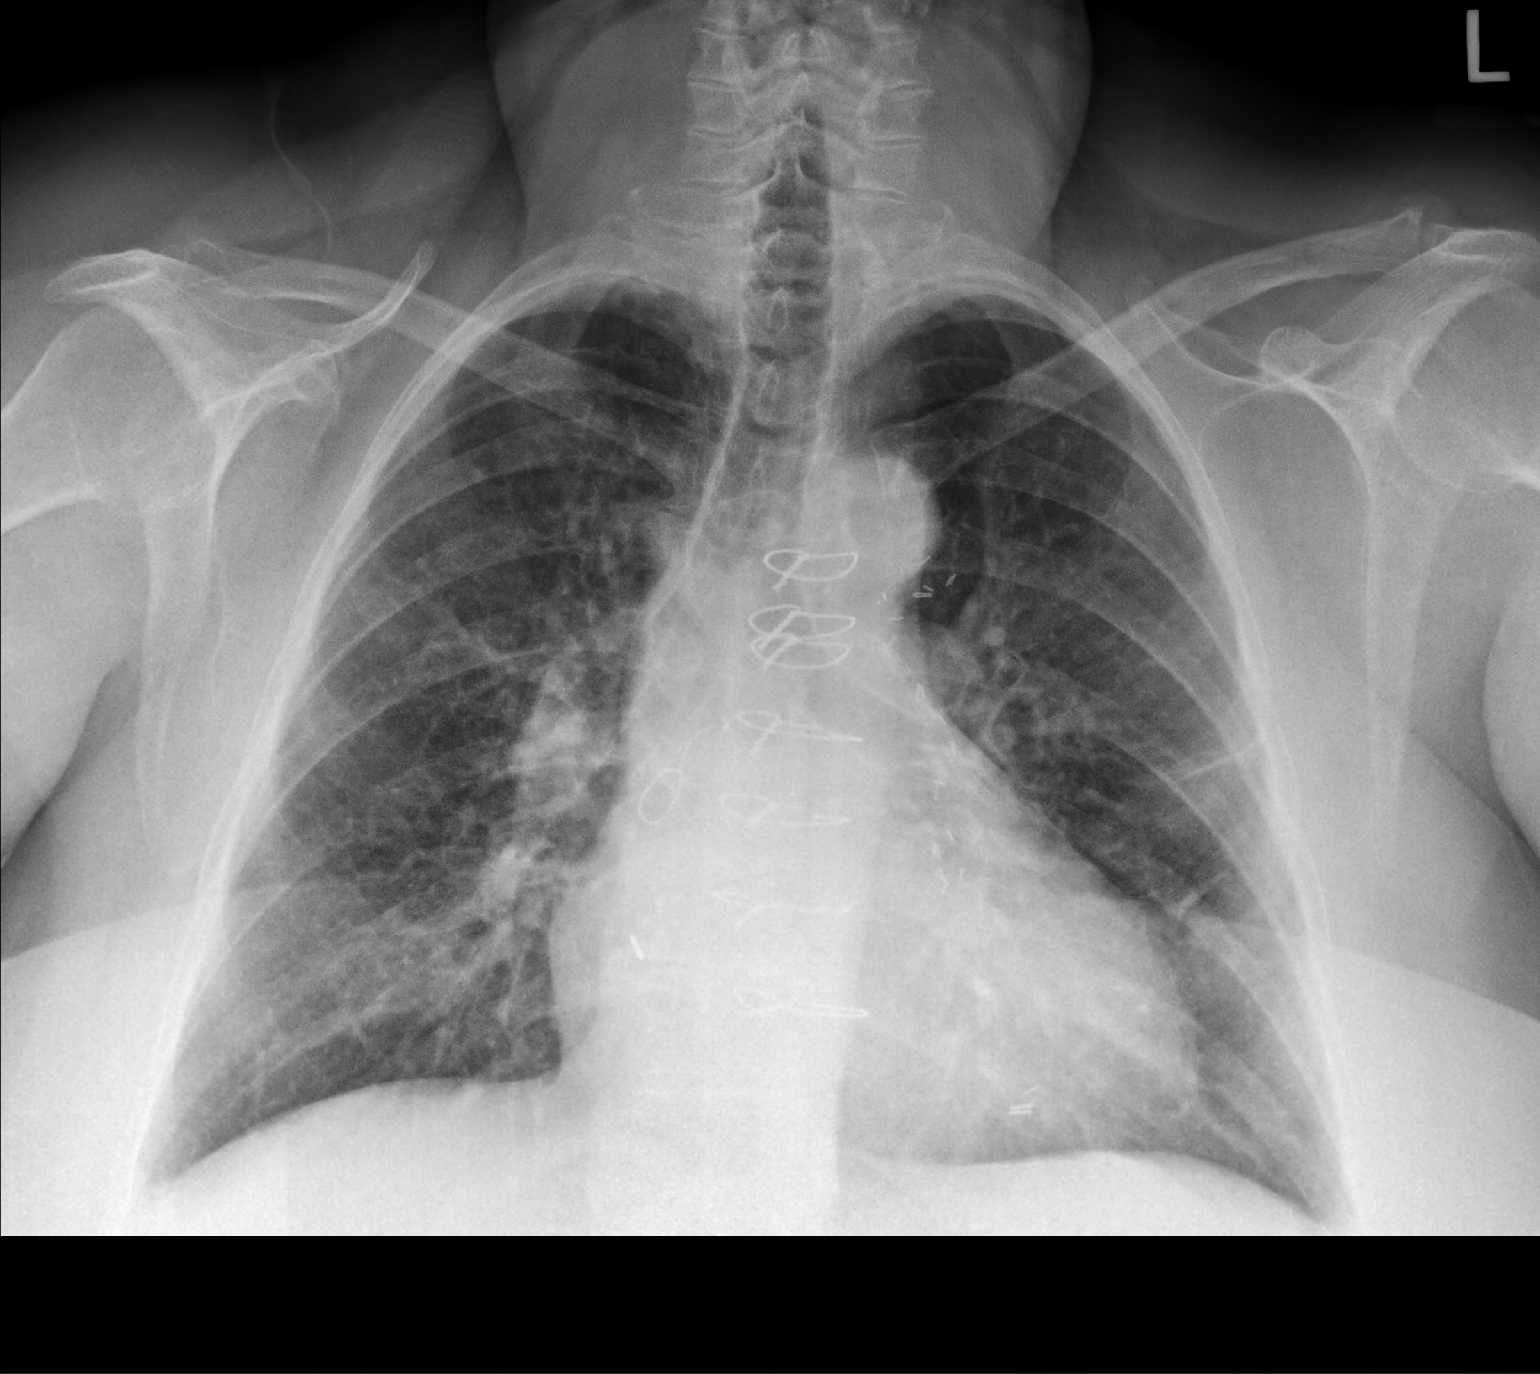

[chest lat]
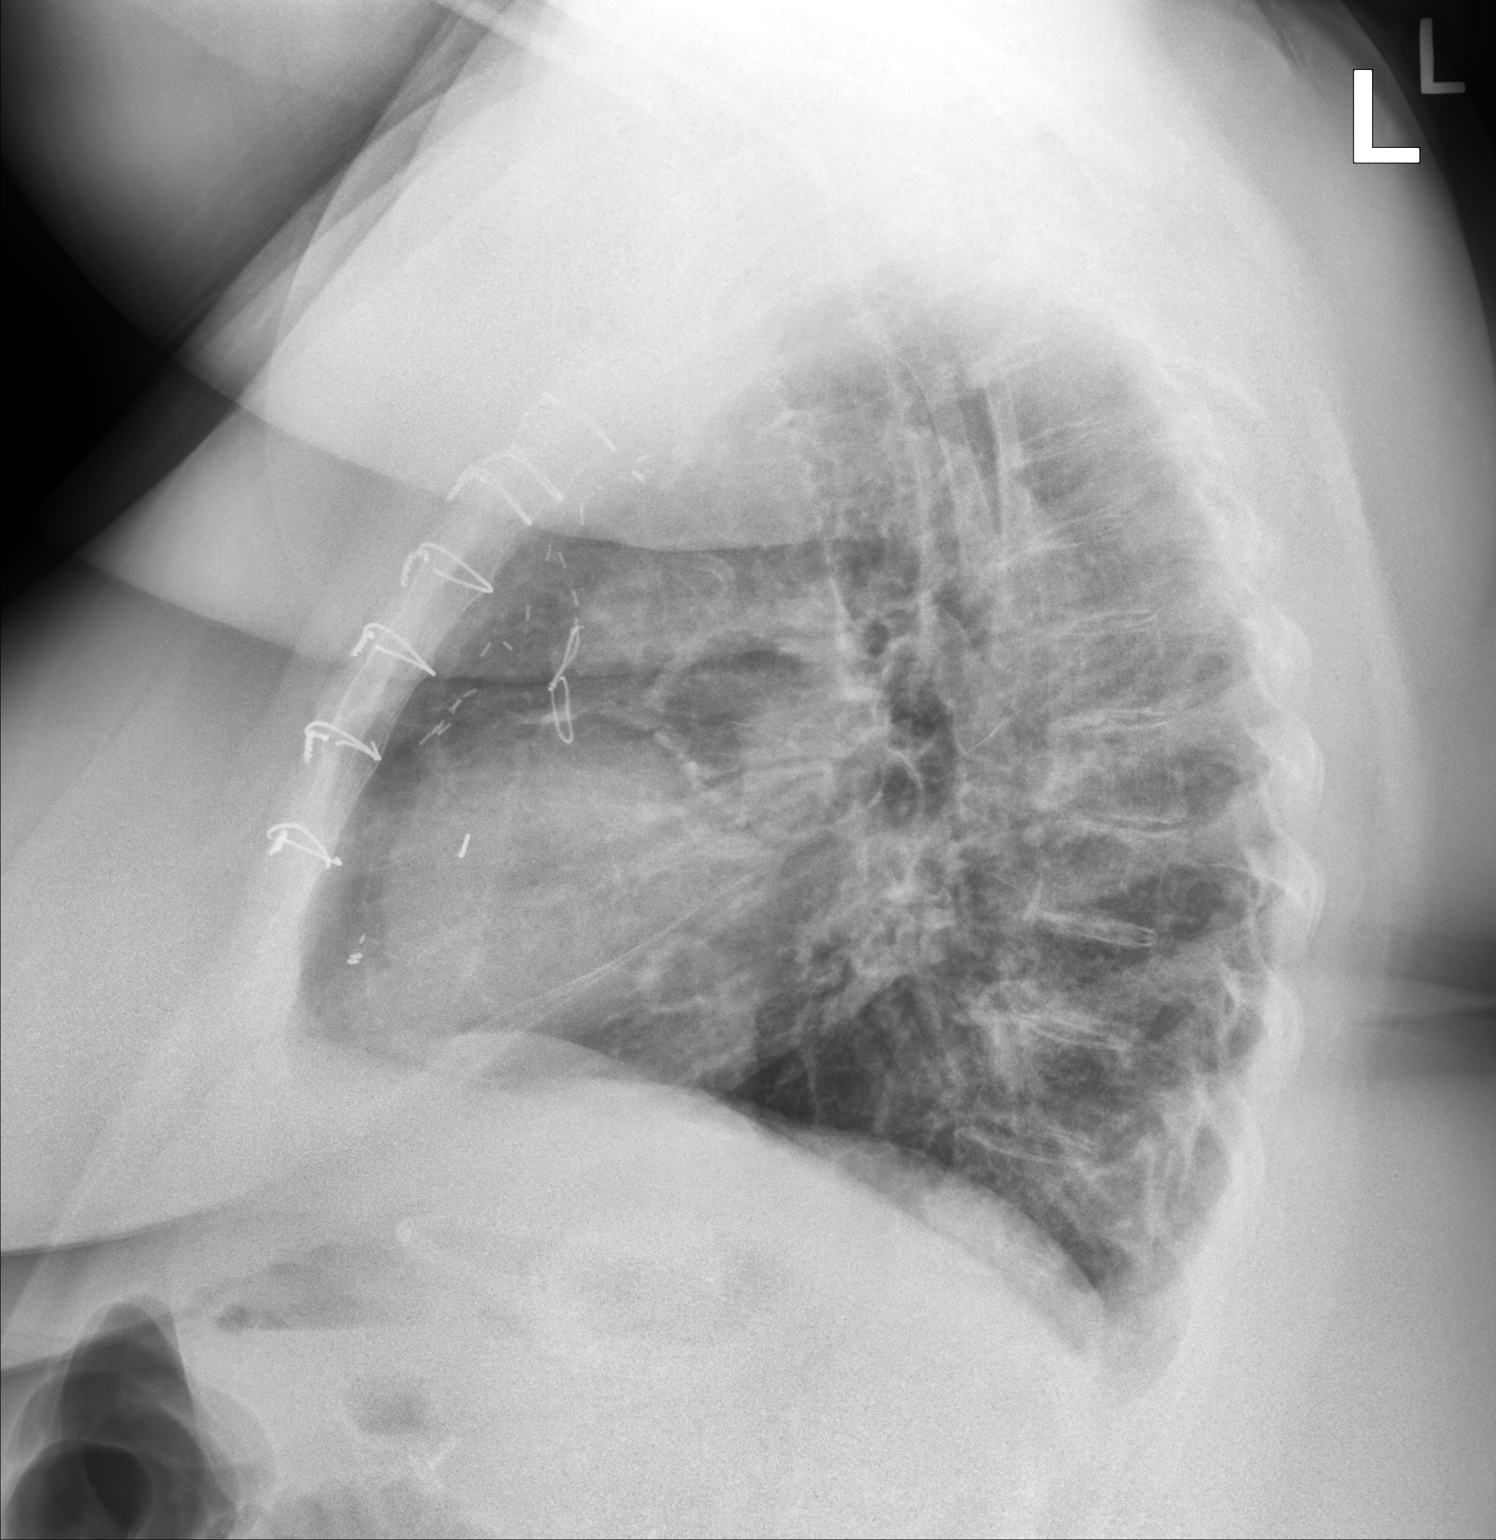

[2 of 2 positions shown; findings below may reference images not displayed]

FINDINGS: Stable borderline cardiac enlargement and mild tortuosity of the
thoracic aorta. Stable surgical changes from bypass surgery.

Peribronchial thickening and increased interstitial markings along
with streaky areas of subsegmental atelectasis suggesting
bronchitis. No focal infiltrates or pleural effusions. The bony
thorax is intact.
IMPRESSION: Findings suggest bronchitis.  No infiltrates or effusions.

## 2019-10-13 ENCOUNTER — Ambulatory Visit (INDEPENDENT_AMBULATORY_CARE_PROVIDER_SITE_OTHER): Payer: Medicare Other | Admitting: Pharmacist

## 2019-10-13 DIAGNOSIS — I480 Paroxysmal atrial fibrillation: Secondary | ICD-10-CM | POA: Diagnosis not present

## 2019-10-13 DIAGNOSIS — Z7901 Long term (current) use of anticoagulants: Secondary | ICD-10-CM | POA: Diagnosis not present

## 2019-10-13 LAB — POCT INR: INR: 4 — AB (ref 2.0–3.0)

## 2019-10-14 DIAGNOSIS — Z940 Kidney transplant status: Secondary | ICD-10-CM | POA: Diagnosis not present

## 2019-10-14 DIAGNOSIS — M797 Fibromyalgia: Secondary | ICD-10-CM | POA: Diagnosis not present

## 2019-10-14 DIAGNOSIS — J440 Chronic obstructive pulmonary disease with acute lower respiratory infection: Secondary | ICD-10-CM | POA: Diagnosis not present

## 2019-10-14 DIAGNOSIS — J189 Pneumonia, unspecified organism: Secondary | ICD-10-CM | POA: Diagnosis not present

## 2019-10-14 DIAGNOSIS — F418 Other specified anxiety disorders: Secondary | ICD-10-CM | POA: Diagnosis not present

## 2019-10-14 DIAGNOSIS — K219 Gastro-esophageal reflux disease without esophagitis: Secondary | ICD-10-CM | POA: Diagnosis not present

## 2019-10-14 DIAGNOSIS — I5032 Chronic diastolic (congestive) heart failure: Secondary | ICD-10-CM | POA: Diagnosis not present

## 2019-10-14 DIAGNOSIS — D509 Iron deficiency anemia, unspecified: Secondary | ICD-10-CM | POA: Diagnosis not present

## 2019-10-14 DIAGNOSIS — I6523 Occlusion and stenosis of bilateral carotid arteries: Secondary | ICD-10-CM | POA: Diagnosis not present

## 2019-10-14 DIAGNOSIS — Z7952 Long term (current) use of systemic steroids: Secondary | ICD-10-CM | POA: Diagnosis not present

## 2019-10-14 DIAGNOSIS — G4733 Obstructive sleep apnea (adult) (pediatric): Secondary | ICD-10-CM | POA: Diagnosis not present

## 2019-10-14 DIAGNOSIS — Z6841 Body Mass Index (BMI) 40.0 and over, adult: Secondary | ICD-10-CM | POA: Diagnosis not present

## 2019-10-14 DIAGNOSIS — M069 Rheumatoid arthritis, unspecified: Secondary | ICD-10-CM | POA: Diagnosis not present

## 2019-10-14 DIAGNOSIS — E1121 Type 2 diabetes mellitus with diabetic nephropathy: Secondary | ICD-10-CM | POA: Diagnosis not present

## 2019-10-14 DIAGNOSIS — E785 Hyperlipidemia, unspecified: Secondary | ICD-10-CM | POA: Diagnosis not present

## 2019-10-14 DIAGNOSIS — E1151 Type 2 diabetes mellitus with diabetic peripheral angiopathy without gangrene: Secondary | ICD-10-CM | POA: Diagnosis not present

## 2019-10-14 DIAGNOSIS — Z7901 Long term (current) use of anticoagulants: Secondary | ICD-10-CM | POA: Diagnosis not present

## 2019-10-14 DIAGNOSIS — Z794 Long term (current) use of insulin: Secondary | ICD-10-CM | POA: Diagnosis not present

## 2019-10-14 DIAGNOSIS — I110 Hypertensive heart disease with heart failure: Secondary | ICD-10-CM | POA: Diagnosis not present

## 2019-10-14 DIAGNOSIS — I480 Paroxysmal atrial fibrillation: Secondary | ICD-10-CM | POA: Diagnosis not present

## 2019-10-14 DIAGNOSIS — A419 Sepsis, unspecified organism: Secondary | ICD-10-CM | POA: Diagnosis not present

## 2019-10-14 DIAGNOSIS — I2510 Atherosclerotic heart disease of native coronary artery without angina pectoris: Secondary | ICD-10-CM | POA: Diagnosis not present

## 2019-10-14 DIAGNOSIS — I0981 Rheumatic heart failure: Secondary | ICD-10-CM | POA: Diagnosis not present

## 2019-10-14 DIAGNOSIS — I083 Combined rheumatic disorders of mitral, aortic and tricuspid valves: Secondary | ICD-10-CM | POA: Diagnosis not present

## 2019-10-16 ENCOUNTER — Ambulatory Visit (HOSPITAL_COMMUNITY): Payer: Medicare Other | Admitting: Nurse Practitioner

## 2019-10-16 ENCOUNTER — Other Ambulatory Visit: Payer: Self-pay

## 2019-10-16 ENCOUNTER — Ambulatory Visit (INDEPENDENT_AMBULATORY_CARE_PROVIDER_SITE_OTHER): Payer: Medicare Other | Admitting: Emergency Medicine

## 2019-10-16 ENCOUNTER — Encounter (HOSPITAL_COMMUNITY): Payer: Self-pay

## 2019-10-16 ENCOUNTER — Encounter: Payer: Self-pay | Admitting: Emergency Medicine

## 2019-10-16 ENCOUNTER — Ambulatory Visit (INDEPENDENT_AMBULATORY_CARE_PROVIDER_SITE_OTHER): Payer: Medicare Other | Admitting: Pharmacist

## 2019-10-16 DIAGNOSIS — J4530 Mild persistent asthma, uncomplicated: Secondary | ICD-10-CM | POA: Diagnosis not present

## 2019-10-16 DIAGNOSIS — I480 Paroxysmal atrial fibrillation: Secondary | ICD-10-CM

## 2019-10-16 DIAGNOSIS — Z7901 Long term (current) use of anticoagulants: Secondary | ICD-10-CM | POA: Diagnosis not present

## 2019-10-16 LAB — POCT INR: INR: 1.1 — AB (ref 2.0–3.0)

## 2019-10-16 NOTE — Progress Notes (Signed)
Virtual Visit via Telephone Note  I connected with Katherine [name] on 10/16/19 at  3:00 PM EST by telephone and verified that I am speaking with the correct person using two identifiers.  Location: Patient: Home Provider: Office   I discussed the limitations, risks, security and privacy concerns of performing an evaluation and management service by telephone and the availability of in person appointments. I also discussed with the patient that there may be a patient responsible charge related to this service. The patient expressed understanding and agreed to proceed.   History of Present Illness: 64 year old woman with history of obesity (prior lap band), formally on CPAP for OSA but no longer.  Also with history of end-stage renal disease and a functional renal transplant on Imuran, low-dose prednisone.  She has hypertension with grade 2 diastolic dysfunction that has resulted in episodic volume overload.  Finally we follow her for obstructive lung disease with asthmatic bronchitis that has been characterized by frequent exacerbations.  There is significant crossover between her lower and upper airway disease as she has chronic cough in the setting of allergic rhinitis and GERD.   Observations/Objective: Since last time she came in for a walking oximetry and did qualify for exertional oxygen.  I have asked her to wear 2 L/min w exertion, with sleep.  She remains on Brovana, Pulmicort nebs.  We have considered adding a LAMA at some point in the future depending on progress. She has been using albuterol HFA about once a day, albuterol nebs bid on a schedule. She reports a lot more energy since starting the O2.  She has not yet had her COVID vaccine - discussed with her today, recommended it for her  Assessment and Plan: COPD / asthma -continue budesonide, Brovana, albuterol as needed.  Plan to repeat her pulmonary function testing this summer to assess degree of obstruction.  Hypoxemic  respiratory failure, confirmed on overnight oximetry and also ambulatory oximetry.  She is using 2 L/min with exertion and while sleeping.  History of OSA, not seen on her most recent PSG but she has had some weight gain.  She will likely need a repeat PSG at some point going forward.  We can discuss further  Health maintenance-I recommended that she get the COVID-19 vaccine.  No contraindications noted.  Follow Up Instructions: 3 months with PFT on same day.    I discussed the assessment and treatment plan with the patient. The patient was provided an opportunity to ask questions and all were answered. The patient agreed with the plan and demonstrated an understanding of the instructions.   The patient was advised to call back or seek an in-person evaluation if the symptoms worsen or if the condition fails to improve as anticipated.  I provided 15 minutes of non-face-to-face time during this encounter.   Collene Gobble, MD

## 2019-10-16 NOTE — Addendum Note (Signed)
Addended by: Tery Sanfilippo R on: 10/16/2019 03:25 PM   Modules accepted: Orders

## 2019-10-20 ENCOUNTER — Ambulatory Visit (INDEPENDENT_AMBULATORY_CARE_PROVIDER_SITE_OTHER): Payer: Medicare Other | Admitting: Pharmacist Clinician (PhC)/ Clinical Pharmacy Specialist

## 2019-10-20 DIAGNOSIS — Z7901 Long term (current) use of anticoagulants: Secondary | ICD-10-CM | POA: Diagnosis not present

## 2019-10-20 DIAGNOSIS — I480 Paroxysmal atrial fibrillation: Secondary | ICD-10-CM | POA: Diagnosis not present

## 2019-10-20 LAB — POCT INR: INR: 6.1 — AB (ref 2.0–3.0)

## 2019-10-21 ENCOUNTER — Other Ambulatory Visit: Payer: Self-pay | Admitting: Cardiology

## 2019-10-21 NOTE — Telephone Encounter (Signed)
Received request for refill on Metoprolol. Patient not seen by Dr. Ellyn Hack since 2019.   Recently has only seen Roderic Palau, NP in [facility]. Refill sent to pharmacy for 30 days, no refills. Pt has appt in AFib clinic later this month. I will send a msg to schedulers to get a follow up scheduled with Dr. Ellyn Hack or APP on team for regular follow up for coronary artery disease. Richardson Dopp, PA-C    10/21/2019 9:58 AM

## 2019-10-23 ENCOUNTER — Telehealth: Payer: Self-pay | Admitting: Emergency Medicine

## 2019-10-23 ENCOUNTER — Other Ambulatory Visit: Payer: Self-pay | Admitting: Cardiology

## 2019-10-23 ENCOUNTER — Ambulatory Visit (INDEPENDENT_AMBULATORY_CARE_PROVIDER_SITE_OTHER): Payer: Medicare Other | Admitting: Cardiology

## 2019-10-23 DIAGNOSIS — I480 Paroxysmal atrial fibrillation: Secondary | ICD-10-CM

## 2019-10-23 DIAGNOSIS — J45909 Unspecified asthma, uncomplicated: Secondary | ICD-10-CM

## 2019-10-23 DIAGNOSIS — Z7901 Long term (current) use of anticoagulants: Secondary | ICD-10-CM | POA: Diagnosis not present

## 2019-10-23 DIAGNOSIS — J449 Chronic obstructive pulmonary disease, unspecified: Secondary | ICD-10-CM

## 2019-10-23 LAB — POCT INR: INR: 1.3 — AB (ref 2.0–3.0)

## 2019-10-23 MED ORDER — WARFARIN SODIUM 5 MG PO TABS: 5.0000 mg | ORAL_TABLET | Freq: Every day | ORAL | 3 refills | Status: DC

## 2019-10-23 NOTE — Telephone Encounter (Signed)
Dr Lamonte Sakai, please advise if okay to send order to [employer] for o2 mask for the pt, thanks

## 2019-10-23 NOTE — Telephone Encounter (Signed)
Yes this is Ok 

## 2019-10-24 NOTE — Telephone Encounter (Signed)
Order placed. Nothing further needed at this time. 

## 2019-10-27 ENCOUNTER — Other Ambulatory Visit (HOSPITAL_COMMUNITY): Payer: Self-pay | Admitting: Nurse Practitioner

## 2019-10-30 ENCOUNTER — Ambulatory Visit (INDEPENDENT_AMBULATORY_CARE_PROVIDER_SITE_OTHER): Payer: Medicare Other | Admitting: Cardiovascular Disease

## 2019-10-30 DIAGNOSIS — Z7901 Long term (current) use of anticoagulants: Secondary | ICD-10-CM | POA: Diagnosis not present

## 2019-10-30 DIAGNOSIS — I480 Paroxysmal atrial fibrillation: Secondary | ICD-10-CM | POA: Diagnosis not present

## 2019-10-30 LAB — POCT INR: INR: 4.9 — AB (ref 2.0–3.0)

## 2019-11-01 DIAGNOSIS — I4891 Unspecified atrial fibrillation: Secondary | ICD-10-CM | POA: Diagnosis not present

## 2019-11-01 DIAGNOSIS — E669 Obesity, unspecified: Secondary | ICD-10-CM | POA: Diagnosis not present

## 2019-11-01 DIAGNOSIS — I129 Hypertensive chronic kidney disease with stage 1 through stage 4 chronic kidney disease, or unspecified chronic kidney disease: Secondary | ICD-10-CM | POA: Diagnosis not present

## 2019-11-01 DIAGNOSIS — N2581 Secondary hyperparathyroidism of renal origin: Secondary | ICD-10-CM | POA: Diagnosis not present

## 2019-11-01 DIAGNOSIS — N182 Chronic kidney disease, stage 2 (mild): Secondary | ICD-10-CM | POA: Diagnosis not present

## 2019-11-01 DIAGNOSIS — E785 Hyperlipidemia, unspecified: Secondary | ICD-10-CM | POA: Diagnosis not present

## 2019-11-01 DIAGNOSIS — I739 Peripheral vascular disease, unspecified: Secondary | ICD-10-CM | POA: Diagnosis not present

## 2019-11-01 DIAGNOSIS — E118 Type 2 diabetes mellitus with unspecified complications: Secondary | ICD-10-CM | POA: Diagnosis not present

## 2019-11-01 DIAGNOSIS — I2510 Atherosclerotic heart disease of native coronary artery without angina pectoris: Secondary | ICD-10-CM | POA: Diagnosis not present

## 2019-11-01 DIAGNOSIS — D631 Anemia in chronic kidney disease: Secondary | ICD-10-CM | POA: Diagnosis not present

## 2019-11-01 DIAGNOSIS — I639 Cerebral infarction, unspecified: Secondary | ICD-10-CM | POA: Diagnosis not present

## 2019-11-01 DIAGNOSIS — Z940 Kidney transplant status: Secondary | ICD-10-CM | POA: Diagnosis not present

## 2019-11-06 ENCOUNTER — Ambulatory Visit (HOSPITAL_COMMUNITY): Payer: Medicare Other | Admitting: Nurse Practitioner

## 2019-11-06 ENCOUNTER — Telehealth: Payer: Self-pay | Admitting: Emergency Medicine

## 2019-11-06 NOTE — Telephone Encounter (Signed)
Called and spoke with pt who stated when she woke up this morning, O2 sats on cpap was 88%.  Pt states when she wears her O2, sats have been ranging in mid 90s as they were 94% when she last checked sats.  Pt stated at times she is able to take O2 off and do some cleaning or other work but today she has been having to keep O2 on as she has not been feeling well.  Pt states that she has felt more short-winded today.  Pt is wearing her O2 at 2L. Pt wants to know if we have any recommendations for her. Dr. Lamonte Sakai, please advise.

## 2019-11-07 ENCOUNTER — Ambulatory Visit (INDEPENDENT_AMBULATORY_CARE_PROVIDER_SITE_OTHER): Payer: Medicare Other | Admitting: Pharmacist

## 2019-11-07 DIAGNOSIS — Z7901 Long term (current) use of anticoagulants: Secondary | ICD-10-CM | POA: Diagnosis not present

## 2019-11-07 DIAGNOSIS — I480 Paroxysmal atrial fibrillation: Secondary | ICD-10-CM

## 2019-11-07 LAB — POCT INR: INR: 3.2 — AB (ref 2.0–3.0)

## 2019-11-08 ENCOUNTER — Telehealth: Payer: Self-pay | Admitting: Cardiology

## 2019-11-08 MED ORDER — METOPROLOL TARTRATE 25 MG PO TABS: 25.0000 mg | ORAL_TABLET | Freq: Two times a day (BID) | ORAL | 0 refills | Status: DC

## 2019-11-08 NOTE — Telephone Encounter (Signed)
Spoke with pt who report she is needing a new prescription sent to the pharmacy for Metoprolol 25 mg two tablets BID. Pt state she know the current prescription is only wrote for 1 tablet BID but report she has been taking 2 due to increased HR in the morning and at bedtime. Pt has an appointment with afib clinic tomorrow but report she can not wait and need a new prescription sent today.   Will forward message to Dr. Ellyn Hack and Afib clinic.

## 2019-11-08 NOTE — Telephone Encounter (Signed)
For now I would like for her to continue the CPAP and oxygen as she has been using them. We could consider a repeat ONO on CPAP going forward if we are suspicious that her SpO2 is dropping while sleeping.

## 2019-11-08 NOTE — Telephone Encounter (Signed)
Pt c/o medication issue:  1. Name of Medication: metoprolol tartrate (LOPRESSOR) 25 MG tablet  2. How are you currently taking this medication (dosage and times per day)? 2 twice a day  3. Are you having a reaction (difficulty breathing--STAT)?   4. What is your medication issue? Patient states she has had to take 2 tablets twice a day instead of 1 tablet twice a day. She states she is almost out of the medication and would like a new prescription for 2 tablets twice a day.

## 2019-11-08 NOTE — Telephone Encounter (Signed)
Patient called back to confirm changes in the prescription. Call was transferred to the Afib clinic

## 2019-11-08 NOTE — Telephone Encounter (Signed)
Prescription updated. Will need to keep scheduled follow up to further assess continued need for extra metoprolol since on Tikosyn.

## 2019-11-08 NOTE — Telephone Encounter (Signed)
Spoke with patient. She is aware of RB's recommendations. She will remain on 2L of O2 for now.   Nothing further needed at time of call.

## 2019-11-09 ENCOUNTER — Ambulatory Visit (HOSPITAL_COMMUNITY): Payer: Medicare Other | Admitting: Nurse Practitioner

## 2019-11-12 ENCOUNTER — Other Ambulatory Visit: Payer: Self-pay | Admitting: Physician Assistant

## 2019-11-13 DIAGNOSIS — I5032 Chronic diastolic (congestive) heart failure: Secondary | ICD-10-CM | POA: Diagnosis not present

## 2019-11-13 DIAGNOSIS — G4733 Obstructive sleep apnea (adult) (pediatric): Secondary | ICD-10-CM | POA: Diagnosis not present

## 2019-11-13 DIAGNOSIS — M797 Fibromyalgia: Secondary | ICD-10-CM | POA: Diagnosis not present

## 2019-11-13 DIAGNOSIS — I6523 Occlusion and stenosis of bilateral carotid arteries: Secondary | ICD-10-CM | POA: Diagnosis not present

## 2019-11-13 DIAGNOSIS — E1121 Type 2 diabetes mellitus with diabetic nephropathy: Secondary | ICD-10-CM | POA: Diagnosis not present

## 2019-11-13 DIAGNOSIS — Z7952 Long term (current) use of systemic steroids: Secondary | ICD-10-CM | POA: Diagnosis not present

## 2019-11-13 DIAGNOSIS — I480 Paroxysmal atrial fibrillation: Secondary | ICD-10-CM | POA: Diagnosis not present

## 2019-11-13 DIAGNOSIS — Z8701 Personal history of pneumonia (recurrent): Secondary | ICD-10-CM | POA: Diagnosis not present

## 2019-11-13 DIAGNOSIS — I2510 Atherosclerotic heart disease of native coronary artery without angina pectoris: Secondary | ICD-10-CM | POA: Diagnosis not present

## 2019-11-13 DIAGNOSIS — I0981 Rheumatic heart failure: Secondary | ICD-10-CM | POA: Diagnosis not present

## 2019-11-13 DIAGNOSIS — Z6841 Body Mass Index (BMI) 40.0 and over, adult: Secondary | ICD-10-CM | POA: Diagnosis not present

## 2019-11-13 DIAGNOSIS — Z950 Presence of cardiac pacemaker: Secondary | ICD-10-CM | POA: Diagnosis not present

## 2019-11-13 DIAGNOSIS — Z794 Long term (current) use of insulin: Secondary | ICD-10-CM | POA: Diagnosis not present

## 2019-11-13 DIAGNOSIS — E785 Hyperlipidemia, unspecified: Secondary | ICD-10-CM | POA: Diagnosis not present

## 2019-11-13 DIAGNOSIS — E1151 Type 2 diabetes mellitus with diabetic peripheral angiopathy without gangrene: Secondary | ICD-10-CM | POA: Diagnosis not present

## 2019-11-13 DIAGNOSIS — M069 Rheumatoid arthritis, unspecified: Secondary | ICD-10-CM | POA: Diagnosis not present

## 2019-11-13 DIAGNOSIS — Z940 Kidney transplant status: Secondary | ICD-10-CM | POA: Diagnosis not present

## 2019-11-13 DIAGNOSIS — K219 Gastro-esophageal reflux disease without esophagitis: Secondary | ICD-10-CM | POA: Diagnosis not present

## 2019-11-13 DIAGNOSIS — I110 Hypertensive heart disease with heart failure: Secondary | ICD-10-CM | POA: Diagnosis not present

## 2019-11-13 DIAGNOSIS — J449 Chronic obstructive pulmonary disease, unspecified: Secondary | ICD-10-CM | POA: Diagnosis not present

## 2019-11-13 DIAGNOSIS — I083 Combined rheumatic disorders of mitral, aortic and tricuspid valves: Secondary | ICD-10-CM | POA: Diagnosis not present

## 2019-11-13 DIAGNOSIS — F418 Other specified anxiety disorders: Secondary | ICD-10-CM | POA: Diagnosis not present

## 2019-11-13 DIAGNOSIS — Z7901 Long term (current) use of anticoagulants: Secondary | ICD-10-CM | POA: Diagnosis not present

## 2019-11-13 DIAGNOSIS — D509 Iron deficiency anemia, unspecified: Secondary | ICD-10-CM | POA: Diagnosis not present

## 2019-11-14 ENCOUNTER — Ambulatory Visit (INDEPENDENT_AMBULATORY_CARE_PROVIDER_SITE_OTHER): Payer: Medicare Other | Admitting: Pharmacist Clinician (PhC)/ Clinical Pharmacy Specialist

## 2019-11-14 DIAGNOSIS — I480 Paroxysmal atrial fibrillation: Secondary | ICD-10-CM | POA: Diagnosis not present

## 2019-11-14 DIAGNOSIS — Z7901 Long term (current) use of anticoagulants: Secondary | ICD-10-CM | POA: Diagnosis not present

## 2019-11-14 LAB — POCT INR: INR: 2.3 (ref 2.0–3.0)

## 2019-11-15 ENCOUNTER — Encounter: Payer: Medicare Other | Attending: Physical Medicine & Rehabilitation | Admitting: Physical Medicine & Rehabilitation

## 2019-11-15 ENCOUNTER — Other Ambulatory Visit: Payer: Self-pay

## 2019-11-15 ENCOUNTER — Encounter: Payer: Self-pay | Admitting: Physical Medicine & Rehabilitation

## 2019-11-15 VITALS — BP 120/80 | HR 72 | Ht 61.0 in | Wt 223.0 lb

## 2019-11-15 DIAGNOSIS — M1711 Unilateral primary osteoarthritis, right knee: Secondary | ICD-10-CM | POA: Diagnosis not present

## 2019-11-15 DIAGNOSIS — M4726 Other spondylosis with radiculopathy, lumbar region: Secondary | ICD-10-CM | POA: Diagnosis not present

## 2019-11-15 DIAGNOSIS — M797 Fibromyalgia: Secondary | ICD-10-CM

## 2019-11-15 MED ORDER — OXYCODONE HCL 10 MG PO TABS: 10.0000 mg | ORAL_TABLET | Freq: Three times a day (TID) | ORAL | 0 refills | Status: DC | PRN

## 2019-11-15 NOTE — Progress Notes (Signed)
Subjective:    Patient ID: Katherine [name], female    DOB: August 18, 1955, 64 y.o.   MRN: [id number]  HPI   Due to national recommendations of social distancing because of COVID 68, an audio/video tele-health visit is felt to be the most appropriate encounter for this patient at this time. See MyChart message from today for the patient's consent to a tele-health encounter with [facility]. This is a follow up telephone visit for the patient who is at home. MD is at office.    We are meeting in regards to her chronic pain syndrome. She has been struggling with the increased pollen in the air. Dr. Lamonte Sakai also placed her on oxygen recently 2L which she uses at night and sometimes during the day.   She continues to have pain in her low back as well as her knees. She's limited with basic activities at home often due to her pain and at times her breathing. Sometimes she feels that she just hurts all over.   She remains on oxycodone for pain control.      Pain Inventory Average Pain 5 Pain Right Now 4 My pain is constant, sharp, burning, dull, stabbing, tingling and aching  In the last 24 hours, has pain interfered with the following? General activity 8 Relation with others 8 Enjoyment of life 9 What TIME of day is your pain at its worst? morning Sleep (in general) Good  Pain is worse with: walking, bending, standing and some activites Pain improves with: heat/ice and medication Relief from Meds: 10  Mobility walk without assistance walk with assistance use a cane use a walker ability to climb steps?  yes do you drive?  yes  Function disabled: date disabled .  Neuro/Psych bowel control problems weakness numbness tingling trouble walking spasms confusion depression anxiety  Prior Studies Any changes since last visit?  no  Physicians involved in your care Any changes since last visit?  no   Family History  Problem Relation Age  of Onset  . Cancer Mother        liver  . Heart disease Father   . Cancer Father        colon  . Arrhythmia Brother        Atrial Fibrillation  . Arrhythmia Paternal Aunt        Atrial Fibrillation   Social History   Socioeconomic History  . Marital status: Widowed    Spouse name: Not on file  . Number of children: Not on file  . Years of education: Not on file  . Highest education level: Not on file  Occupational History  . Not on file  Tobacco Use  . Smoking status: Former Smoker    Packs/day: 1.00    Years: 30.00    Pack years: 30.00    Types: Cigarettes    Quit date: 08/17/2002    Years since quitting: 17.2  . Smokeless tobacco: Never Used  Substance and Sexual Activity  . Alcohol use: No  . Drug use: No  . Sexual activity: Not on file  Other Topics Concern  . Not on file  Social History Narrative   She is currently married, and the caregiver of her husband who is recovering from surgery for tongue cancer now diagnosed with lung cancer. Prior to his diagnosis of her husband, she actually had adopted a 82-year-old child who she knows caring for as well. With all the surgeries, they have been quite financially troubled.  Thanks the help of her community and church, they have been able to stay "alfoat."     She is a former smoker who quit in 2004 after a 30-pack-year history.   She is active chasing a 63-year-old child, does not do routine exercise. She's been quite depressed with the condition of her husband, and admits to eating comfort herself.   She does not drink alcohol.   Social Determinants of Health   Financial Resource Strain:   . Difficulty of Paying Living Expenses:   Food Insecurity:   . Worried About Charity fundraiser in the Last Year:   . Arboriculturist in the Last Year:   Transportation Needs:   . Film/video editor (Medical):   [name] Kitchen Lack of Transportation (Non-Medical):   Physical Activity:   . Days of Exercise per Week:   . Minutes of Exercise  per Session:   Stress:   . Feeling of Stress :   Social Connections:   . Frequency of Communication with Friends and Family:   . Frequency of Social Gatherings with Friends and Family:   . Attends Religious Services:   . Active Member of Clubs or Organizations:   . Attends Archivist Meetings:   [name] Kitchen Marital Status:    Past Surgical History:  Procedure Laterality Date  . ABDOMINAL AORTOGRAM N/A 04/21/2018   Procedure: ABDOMINAL AORTOGRAM;  Surgeon: [name], MD;  Location: [facility];  Service: Cardiovascular;  Laterality: N/A;  . CARDIAC CATHETERIZATION  5/'01, 3/'02, 8/'03, 10/'04; 1/'15   08/22/2013: LAD & RCA 100%; LIMA-LAD & SVG-rPDA patent; Cx-- OM1 60%, OM2 ostial ~50%; SVG-D1 - 80% mid, 50% distal ISR --PCI  . CATHETER REMOVAL    . CHOLECYSTECTOMY N/A 10/29/2014   Procedure: LAPAROSCOPIC CHOLECYSTECTOMY WITH INTRAOPERATIVE CHOLANGIOGRAM;  Surgeon: Excell Seltzer, MD;  Location: [facility];  Service: General;  Laterality: N/A;  . CORONARY ANGIOPLASTY  1994   x5  . CORONARY ARTERY BYPASS GRAFT  1995   LIMA-LAD, SVG-RPDA, SVG-D1  . ESOPHAGOGASTRODUODENOSCOPY N/A 10/15/2016   Procedure: ESOPHAGOGASTRODUODENOSCOPY (EGD);  Surgeon: Wilford Corner, MD;  Location[facility];  Service: Endoscopy;  Laterality: N/A;  . I & D EXTREMITY Right 01/29/2018   Procedure: IRRIGATION AND DEBRIDEMENT THUMB;  Surgeon: Dayna Barker, MD;  Location: [facility];  Service: Plastics;  Laterality: Right;  . INCISE AND DRAIN ABCESS    . KIDNEY TRANSPLANT  1991  . KNEE ARTHROSCOPY WITH LATERAL MENISECTOMY Left 12/03/2017   Procedure: LEFT KNEE ARTHROSCOPY WITH LATERAL MENISECTOMY;  Surgeon: Earlie Server, MD;  Location: [facility];  Service: Orthopedics;  Laterality: Left;  . LAPAROSCOPIC GASTRIC BANDING  04/2004; 10/'09, 2/'10   Port Replacement x 2  . LEFT HEART CATH AND CORS/GRAFTS ANGIOGRAPHY N/A 04/21/2018   Procedure: LEFT HEART CATH AND CORS/GRAFTS ANGIOGRAPHY;  Surgeon: [name],  MD;  Location: [place];  Service: Cardiovascular;  Laterality: N/A;  . LEFT HEART CATHETERIZATION WITH CORONARY/GRAFT ANGIOGRAM N/A 08/23/2013   Procedure: LEFT HEART CATHETERIZATION WITH Beatrix Fetters;  Surgeon: Wellington Hampshire, MD;  Location: [facility];  Service: Cardiovascular;  Laterality: N/A;  . Lower Extremity Arterial Dopplers  08/2013   ABI: R 0.96, L 1.04  . MULTIPLE TOOTH EXTRACTIONS  age 43  . NM MYOVIEW LTD  03/2016   EF 62%. LOW RISK. C/W prior MI - no Ischemia. Apical hypokinesis.  [name] Kitchen PERCUTANEOUS CORONARY STENT INTERVENTION (PCI-S)  5/'01, 3/'02, 8/'03, 10/'04;   '01 - S660  BMS 2.5 x 9 - dSVG-D1 into D1; '02- post-stent stenosis - 2.5 x 8 Pixel BMS; '8\03: ISR/Thrombosis into native D1 - AngioJet, 2.5 x 13 Pixel; '04 - ISR 95% - covered stented area with Taxus DES 2.5 mm x 20 (2.88)  . PERCUTANEOUS CORONARY STENT INTERVENTION (PCI-S)  08/23/2013   Procedure: PERCUTANEOUS CORONARY STENT INTERVENTION (PCI-S);  Surgeon: Wellington Hampshire, MD;  Location: [facility] CATH LAB;  Service: Cardiovascular;;mid SVG-D1 80%; distal stent ~50% ISR; Promus Prermier DES 2.75 mm xc 20 mm (2.8 mm)  . PORT-A-CATH REMOVAL     kidney  . TRANSTHORACIC ECHOCARDIOGRAM  02/2016   EF 55-60%. Septal dyssynergy from CABG GR 2 DD. Aortic valve is trileaflet the functional bicuspid with sclerosis but not yet notable for stenosis stenosis.; no Significant change  . TUBAL LIGATION    . wrist fistula repair Left    dialysis for one year   Past Medical History:  Diagnosis Date  . Anemia   . Anxiety   . Bilateral carotid artery stenosis    Carotid duplex 02/8587: 5-02% LICA, 77-41% RICA, >28% RECA, f/u 1 yr suggested  . CAD (coronary artery disease) of bypass graft 5/01; 3/'02, 8/'03, 10/'04; 1/15   PCI x 5 to SVG-D1   . CAD in native artery 07/1993    3 Vessel Disease (LAD-D1 & RCA) -- CABG  . CAD S/P percutaneous coronary angioplasty    PCI to SVG-D1 insertion/native D1 x 4 = '01 -(S660 BMS  2.5 x 9 - insertion into D1; '02 - distal overlap ACS Pixel 2.5 x 8  BMS; '03 distal/native ISR/Thrombosis - Pixel 2.5 x 13; '04 - ISR-  Taxus 2.5 x 20 (covered all);; 1/15 - mid SVG-D1 (50% distal ISR) - Promus P 2.75 x 20 -- 2.8 mm  . COPD mixed type ([place])    Followed by Dr. Lamonte Sakai "pulmonologist said no COPD"  . Depression   . Depression with anxiety   . Diabetes mellitus type 2 in obese ([place])   . Diarrhea    started after cholecystectomy and mass removed from intestine  . Dyslipidemia, goal LDL below 70    08/2012: TC 137, TG 200, HDL 32!, LDL 45; on statin (followed by Dr.Deterding)  . ESRD (end stage renal disease) ([place]) 1991   s/p Cadaveric Renal Transplant [facility] - Dr. Jimmy Footman)   . Family history of adverse reaction to anesthesia    mom's bp dropped during/after anesthesia  . Fibromyalgia   . GERD (gastroesophageal reflux disease)   . Glomerulonephritis, chronic, rapidly progressive 65  . 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 07/1993   Rescue PTCA of RCA -- referred for CABG.  . H/O: GI bleed   . Headache    migraines in the past  . History of kidney stones   . Hypertension associated with diabetes ([place])   . Morbid obesity ([place])   . MRSA (methicillin resistant staph aureus) culture positive   . OSA (obstructive sleep apnea)    no longer on CPAP or home O2, states she doesn't need now after lap band  . PAD (peripheral artery disease) ([place]) 08/2013   LEA Dopplers to be read by Dr. Fletcher Anon  . PAF (paroxysmal atrial fibrillation) ([place]) 06/2014   Noted on CardioNet Monitor  - rate ~112; no recurrent symptoms. Nonionic regulation because of frequent GI procedures. Patient prefers Plavix  . Pneumonia   . Recurrent boils    Bilateral Groin  . Rheumatoid arthritis ([place])  Per Patient Report; associated with OA  . 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 Servando Snare: LIMA-LAD, SVG-bifurcatingD1, SVG-rPDA  . S/p cadaver renal transplant [place]  . Stroke [facility])  2012   "right eye stroke- half blind now"  . Unstable angina ([place]) 5/01; 3/'02, 8/'03, 10/'04; 1/15   x 5 occurences since Inf-Post STEMI in 1994   There were no vitals taken for this visit.  Opioid Risk Score:   Fall Risk Score:  `1  Depression screen PHQ 2/9  Depression screen [facility] 2/9 11/02/2018 07/04/2018  Decreased Interest 3 3  Down, Depressed, Hopeless 3 3  PHQ - 2 Score 6 6  Altered sleeping 1 3  Tired, decreased energy 1 3  Change in appetite 1 2  Feeling bad or failure about yourself  0 3  Trouble concentrating 0 2  Moving slowly or fidgety/restless 0 0  Suicidal thoughts 0 0  PHQ-9 Score 9 19  Difficult doing work/chores - Very difficult  Some recent data might be hidden    Review of Systems  HENT: Negative.   Eyes: Negative.   Respiratory: Positive for cough, shortness of breath and wheezing.   Cardiovascular: Negative.   Gastrointestinal: Positive for diarrhea.  Endocrine: Negative.   Genitourinary: Negative.   Musculoskeletal: Positive for arthralgias, back pain, gait problem, myalgias, neck pain and neck stiffness.  Skin: Negative.   Allergic/Immunologic: Negative.   Neurological: Positive for weakness and numbness.       Fibromyalgia, neuropathy, tingling  Psychiatric/Behavioral: Positive for dysphoric mood. The patient is nervous/anxious.   All other systems reviewed and are negative.      Objective:   Physical Exam n/a       Assessment & Plan:  1. Fibromyalgia:               - Continue Tegretol and Cymbalta.              -encourage physical activity as tolerated 2. Rheumatoid Arthritis: Refilled: Oxycodone10mg  one tablet every 8 hours as needed for severe pain #60.               We will continue the controlled substance monitoring program, this consists of regular clinic visits, examinations, routine drug screening, pill counts as well as use of [place] Controlled Substance Reporting System. [place] today.   3.  Osteoarthritis--polyarticular:              -Continue with current medication regime and continue to monitor.  4. ESRD with hx of renal transplant on chronic steroids:              -Nephrology following 5. Right rotator cuff tendonitis/like degenerative/inflammatory changes in the right shoulder also.              -HEP was advised 6. GERD:             -resume pepcid             -cut back on OJ.   7. Depression:             -Continue current medication regimen. Continue to monitor. 8.Lumbar Radiculitis: previous Left L4-5 Transformanial ESI with relief   9.BilateralHip Pain:              -recommend HEP              -heat and ice 10. Type 2 DM with Peripheral Neuropathy             -  tegretol  11 minutes of tele-visit time was spent with this patient today. Follow up in 6 weeks with NP

## 2019-11-15 NOTE — Patient Instructions (Signed)
PLEASE FEEL FREE TO CALL OUR OFFICE WITH ANY PROBLEMS OR QUESTIONS ([phone])      

## 2019-11-16 ENCOUNTER — Ambulatory Visit (HOSPITAL_COMMUNITY)
Admission: RE | Admit: 2019-11-16 | Discharge: 2019-11-16 | Disposition: A | Discharge status: Home / Self Care | Payer: Medicare Other | Source: Ambulatory Visit | Attending: Nurse Practitioner | Admitting: Nurse Practitioner

## 2019-11-16 ENCOUNTER — Other Ambulatory Visit: Payer: Self-pay

## 2019-11-16 ENCOUNTER — Encounter (HOSPITAL_COMMUNITY): Payer: Self-pay | Admitting: Nurse Practitioner

## 2019-11-16 VITALS — BP 148/84 | HR 68 | Ht 61.0 in | Wt 221.0 lb

## 2019-11-16 DIAGNOSIS — Z951 Presence of aortocoronary bypass graft: Secondary | ICD-10-CM | POA: Insufficient documentation

## 2019-11-16 DIAGNOSIS — I447 Left bundle-branch block, unspecified: Secondary | ICD-10-CM | POA: Diagnosis not present

## 2019-11-16 DIAGNOSIS — Z955 Presence of coronary angioplasty implant and graft: Secondary | ICD-10-CM | POA: Diagnosis not present

## 2019-11-16 DIAGNOSIS — Z87891 Personal history of nicotine dependence: Secondary | ICD-10-CM | POA: Insufficient documentation

## 2019-11-16 DIAGNOSIS — I480 Paroxysmal atrial fibrillation: Secondary | ICD-10-CM | POA: Diagnosis not present

## 2019-11-16 DIAGNOSIS — J449 Chronic obstructive pulmonary disease, unspecified: Secondary | ICD-10-CM | POA: Diagnosis not present

## 2019-11-16 DIAGNOSIS — Z7952 Long term (current) use of systemic steroids: Secondary | ICD-10-CM | POA: Insufficient documentation

## 2019-11-16 DIAGNOSIS — G4733 Obstructive sleep apnea (adult) (pediatric): Secondary | ICD-10-CM | POA: Insufficient documentation

## 2019-11-16 DIAGNOSIS — Z7901 Long term (current) use of anticoagulants: Secondary | ICD-10-CM | POA: Insufficient documentation

## 2019-11-16 DIAGNOSIS — I120 Hypertensive chronic kidney disease with stage 5 chronic kidney disease or end stage renal disease: Secondary | ICD-10-CM | POA: Diagnosis not present

## 2019-11-16 DIAGNOSIS — G8929 Other chronic pain: Secondary | ICD-10-CM | POA: Diagnosis not present

## 2019-11-16 DIAGNOSIS — D6869 Other thrombophilia: Secondary | ICD-10-CM

## 2019-11-16 DIAGNOSIS — Z79899 Other long term (current) drug therapy: Secondary | ICD-10-CM | POA: Insufficient documentation

## 2019-11-16 DIAGNOSIS — Z940 Kidney transplant status: Secondary | ICD-10-CM | POA: Diagnosis not present

## 2019-11-16 DIAGNOSIS — N186 End stage renal disease: Secondary | ICD-10-CM | POA: Diagnosis not present

## 2019-11-16 DIAGNOSIS — E1122 Type 2 diabetes mellitus with diabetic chronic kidney disease: Secondary | ICD-10-CM | POA: Insufficient documentation

## 2019-11-16 DIAGNOSIS — Z794 Long term (current) use of insulin: Secondary | ICD-10-CM | POA: Diagnosis not present

## 2019-11-16 DIAGNOSIS — M797 Fibromyalgia: Secondary | ICD-10-CM | POA: Diagnosis not present

## 2019-11-16 DIAGNOSIS — E119 Type 2 diabetes mellitus without complications: Secondary | ICD-10-CM | POA: Diagnosis not present

## 2019-11-16 DIAGNOSIS — Z6841 Body Mass Index (BMI) 40.0 and over, adult: Secondary | ICD-10-CM | POA: Diagnosis not present

## 2019-11-16 DIAGNOSIS — I2510 Atherosclerotic heart disease of native coronary artery without angina pectoris: Secondary | ICD-10-CM | POA: Diagnosis not present

## 2019-11-16 DIAGNOSIS — K219 Gastro-esophageal reflux disease without esophagitis: Secondary | ICD-10-CM | POA: Diagnosis not present

## 2019-11-16 DIAGNOSIS — F418 Other specified anxiety disorders: Secondary | ICD-10-CM | POA: Insufficient documentation

## 2019-11-16 LAB — MAGNESIUM: Magnesium: 1.9 mg/dL (ref 1.7–2.4)

## 2019-11-16 IMAGING — DX CHEST - 2 VIEW
2 series · 2 of 2 positions shown · non-contrast
Comparison: 09/28/2018

CLINICAL DATA: Cough and shortness of breath for 1 month

EXAM:
CHEST - 2 VIEW

[chest pa]
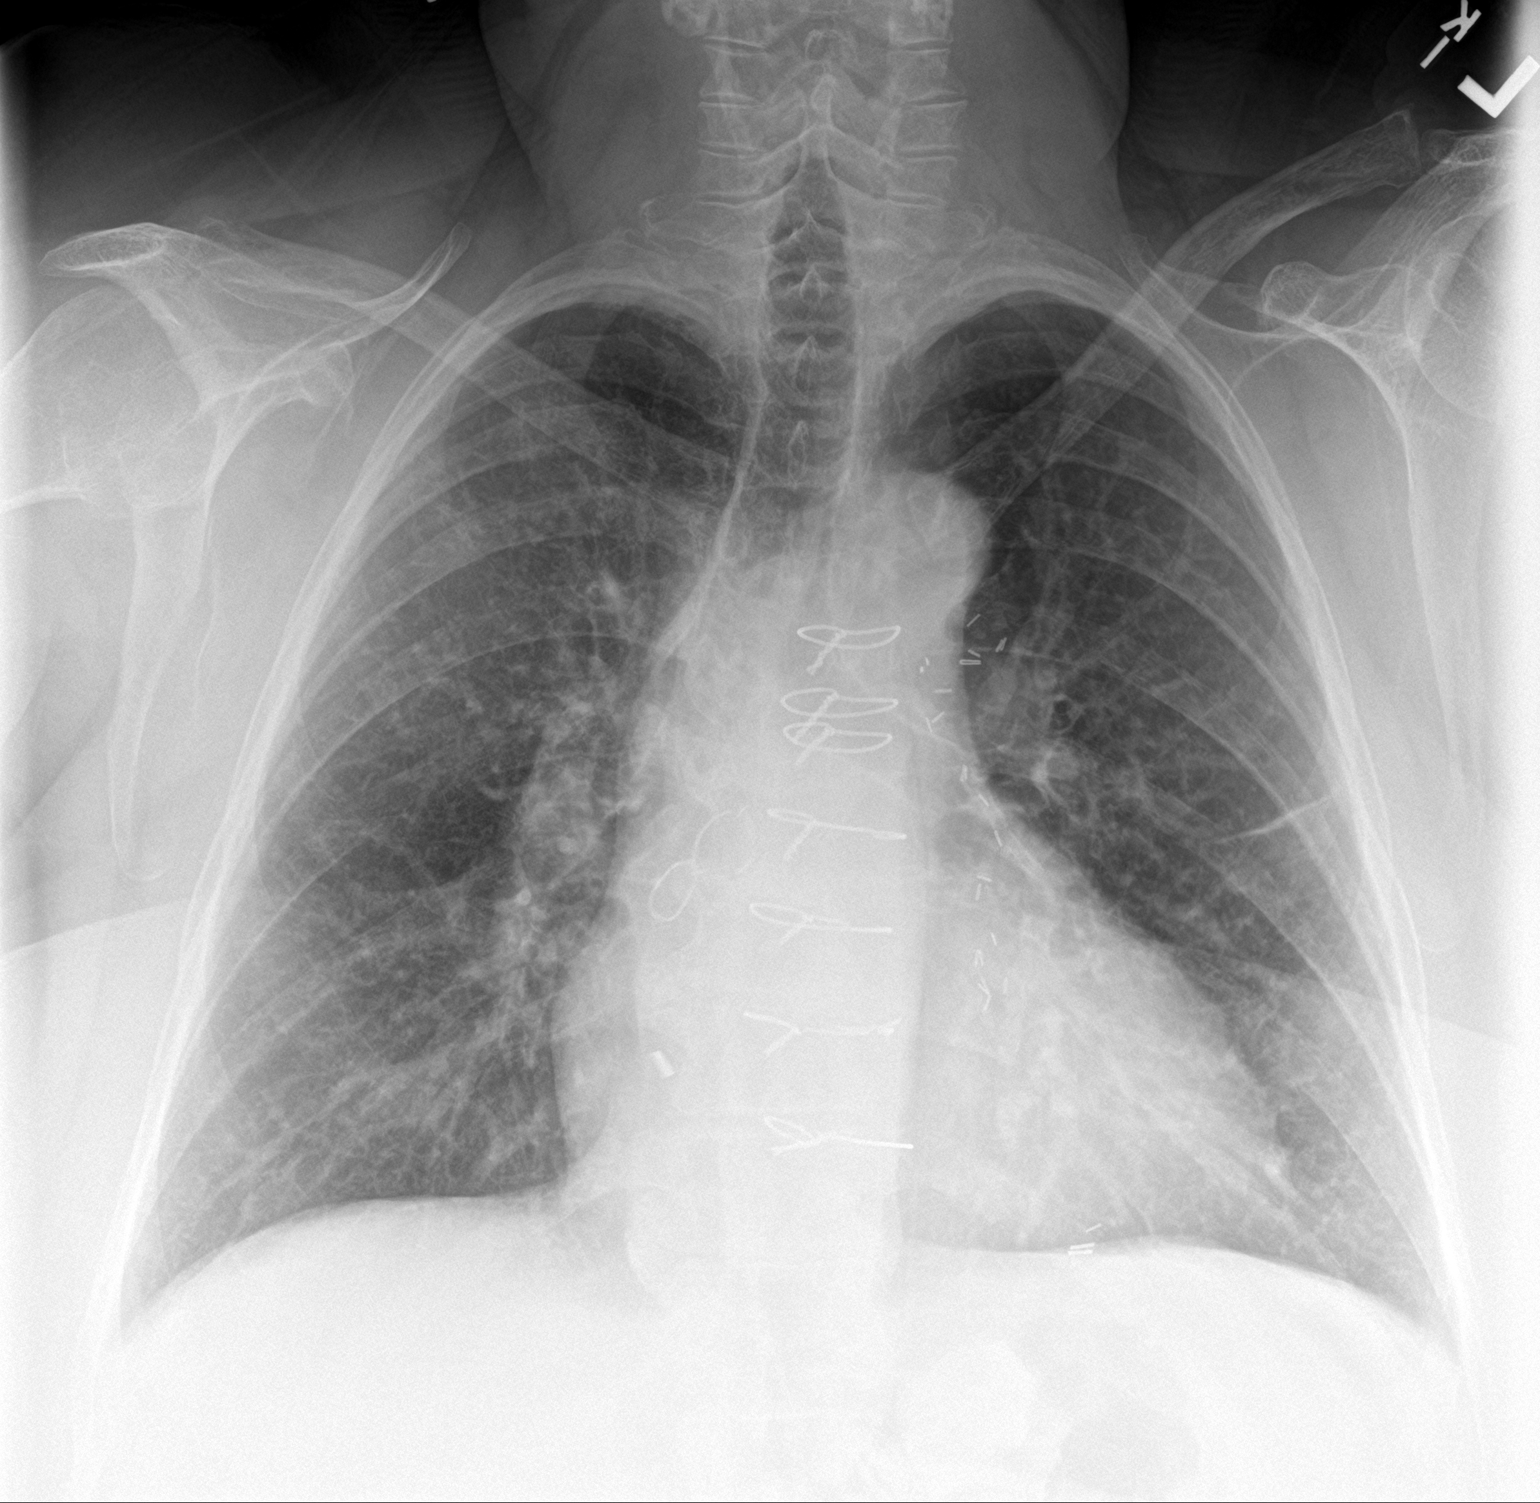

[chest lat]
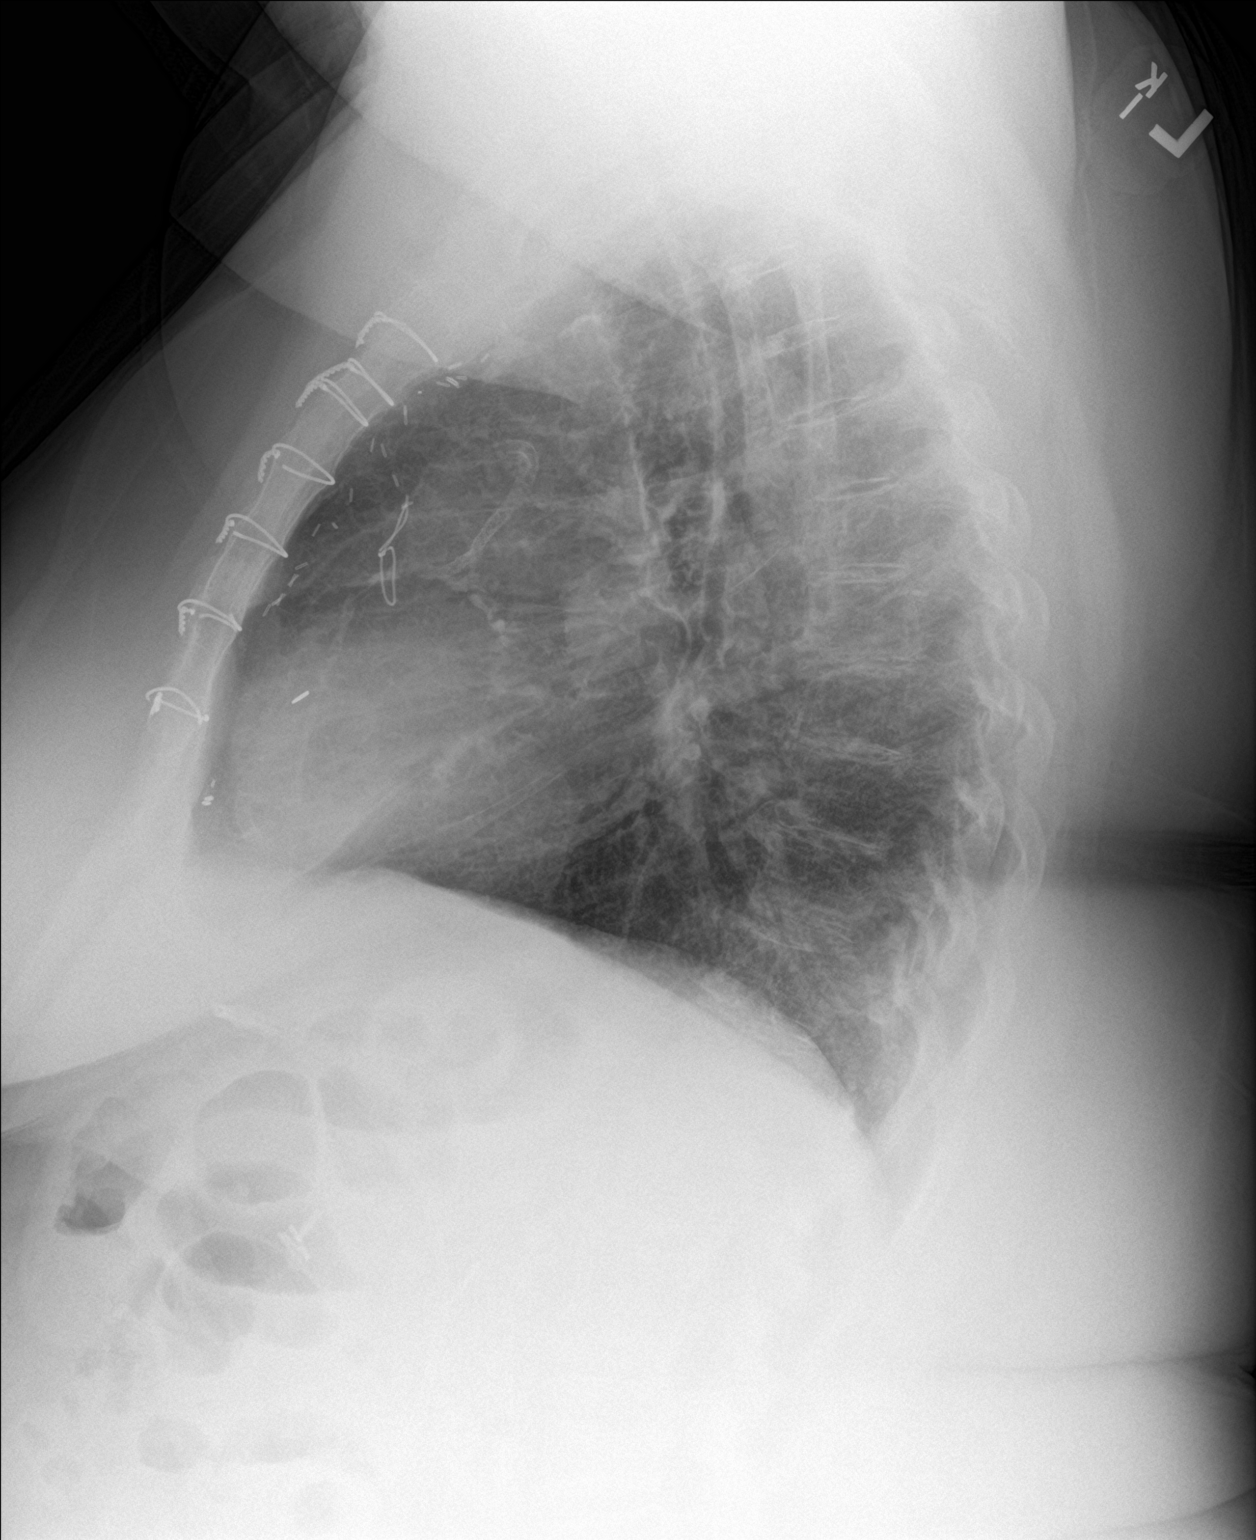

[2 of 2 positions shown; findings below may reference images not displayed]

FINDINGS: Cardiac shadow is enlarged. Postsurgical changes are again seen and
stable. Minimal scarring is noted in the left mid lung stable from
the prior exam. No focal infiltrate or sizable effusion is seen. No
acute bony abnormality is noted.
IMPRESSION: No acute abnormality noted.

## 2019-11-16 MED ORDER — METOPROLOL TARTRATE 50 MG PO TABS: 50.0000 mg | ORAL_TABLET | Freq: Two times a day (BID) | ORAL | 6 refills | Status: DC

## 2019-11-16 NOTE — Progress Notes (Addendum)
Primary Care Physician: [name], MD Referring Physician:[facility] f/u EP: Dr. Charmaine Downs Katherine [name] is a 64 y.o. female with a h/o coronary artery disease status post CABG and subsequent stents, depression with anxiety, chronic pain, renal transplant recipient, paroxysmal atrial fibrillation not on anticoagulation, and insulin-dependent diabetes mellitus, presented to the emergency department with chest pain. Patient reports that she was in her usual state of health and having an uneventful day when she developed sudden onset of rapid palpitations while at rest. This was followed by discomfort across the anterior chest, radiating up into the bilateral jaw, and associated with nausea and diaphoresis. Patient was seen in the emergency department. She was given a dose of intravenous Cardizem with improvement in heart rate. She was hospitalized for further management. Patient seen by cardiology and electrophysiology. Started on Tikosyn load. She also underwent cardiac catheterization which showed stable disease.  F/u in afib clinic, 9/23, for f/u Tikosyn load. She remains in S brady at 55 bpm, qtc stable 461 ms.  F/u in afib clinic, 11/16/19 for Tikosyn surveillance. She reports staying in SR. Feels a few flutters in the pm. She has increased her Metoprolol from 25 mg bid to 50 mg bid as she feels less flutters this way. She had bmet drawn at Nephrologist mid March, labs reviewed with creatinine at 0.73 and K+ 4.7. Magnesium to be drawn today. She remains on warfarin with a CHA2DS2VASc score of at least 3.   Today, she denies symptoms of palpitations, chest pain, shortness of breath, orthopnea, PND, lower extremity edema, dizziness, presyncope, syncope, or neurologic sequela. The patient is tolerating medications without difficulties and is otherwise without complaint today.   Past Medical History:  Diagnosis Date  . Anemia   . Anxiety   . Bilateral carotid artery stenosis    Carotid  duplex 0/1601: 0-93% LICA, 23-55% RICA, >73% RECA, f/u 1 yr suggested  . CAD (coronary artery disease) of bypass graft 5/01; 3/'02, 8/'03, 10/'04; 1/15   PCI x 5 to SVG-D1   . CAD in native artery 07/1993    3 Vessel Disease (LAD-D1 & RCA) -- CABG  . CAD S/P percutaneous coronary angioplasty    PCI to SVG-D1 insertion/native D1 x 4 = '01 -(S660 BMS 2.5 x 9 - insertion into D1; '02 - distal overlap ACS Pixel 2.5 x 8  BMS; '03 distal/native ISR/Thrombosis - Pixel 2.5 x 13; '04 - ISR-  Taxus 2.5 x 20 (covered all);; 1/15 - mid SVG-D1 (50% distal ISR) - Promus P 2.75 x 20 -- 2.8 mm  . COPD mixed type ([place])    Followed by Dr. Lamonte Sakai "pulmonologist said no COPD"  . Depression   . Depression with anxiety   . Diabetes mellitus type 2 in obese ([place])   . Diarrhea    started after cholecystectomy and mass removed from intestine  . Dyslipidemia, goal LDL below 70    08/2012: TC 137, TG 200, HDL 32!, LDL 45; on statin (followed by Dr.Deterding)  . ESRD (end stage renal disease) ([place]) 1991   s/p Cadaveric Renal Transplant [facility] - Dr. Jimmy Footman)   . Family history of adverse reaction to anesthesia    mom's bp dropped during/after anesthesia  . Fibromyalgia   . GERD (gastroesophageal reflux disease)   . Glomerulonephritis, chronic, rapidly progressive 23  . 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 07/1993   Rescue PTCA of RCA -- referred for CABG.  . H/O: GI bleed   .  Headache    migraines in the past  . History of kidney stones   . Hypertension associated with diabetes ([place])   . Morbid obesity ([place])   . MRSA (methicillin resistant staph aureus) culture positive   . OSA (obstructive sleep apnea)    no longer on CPAP or home O2, states she doesn't need now after lap band  . PAD (peripheral artery disease) ([place]) 08/2013   LEA Dopplers to be read by Dr. Fletcher Anon  . PAF (paroxysmal atrial fibrillation) ([place]) 06/2014   Noted on CardioNet Monitor  - rate ~112; no recurrent  symptoms. Nonionic regulation because of frequent GI procedures. Patient prefers Plavix  . Pneumonia   . Recurrent boils    Bilateral Groin  . Rheumatoid arthritis ([place])    Per Patient Report; associated with OA  . 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 Servando Snare: LIMA-LAD, SVG-bifurcatingD1, SVG-rPDA  . S/p cadaver renal transplant [name]  . Stroke [facility]) 2012   "right eye stroke- half blind now"  . Unstable angina ([place]) 5/01; 3/'02, 8/'03, 10/'04; 1/15   x 5 occurences since Inf-Post STEMI in 1994   Past Surgical History:  Procedure Laterality Date  . ABDOMINAL AORTOGRAM N/A 04/21/2018   Procedure: ABDOMINAL AORTOGRAM;  Surgeon: [name], MD;  Location: [facility];  Service: Cardiovascular;  Laterality: N/A;  . CARDIAC CATHETERIZATION  5/'01, 3/'02, 8/'03, 10/'04; 1/'15   08/22/2013: LAD & RCA 100%; LIMA-LAD & SVG-rPDA patent; Cx-- OM1 60%, OM2 ostial ~50%; SVG-D1 - 80% mid, 50% distal ISR --PCI  . CATHETER REMOVAL    . CHOLECYSTECTOMY N/A 10/29/2014   Procedure: LAPAROSCOPIC CHOLECYSTECTOMY WITH INTRAOPERATIVE CHOLANGIOGRAM;  Surgeon: Excell Seltzer, MD;  Location: [facility];  Service: General;  Laterality: N/A;  . CORONARY ANGIOPLASTY  1994   x5  . CORONARY ARTERY BYPASS GRAFT  1995   LIMA-LAD, SVG-RPDA, SVG-D1  . ESOPHAGOGASTRODUODENOSCOPY N/A 10/15/2016   Procedure: ESOPHAGOGASTRODUODENOSCOPY (EGD);  Surgeon: Wilford Corner, MD;  Location: [facility] ENDOSCOPY;  Service: Endoscopy;  Laterality: N/A;  . I & D EXTREMITY Right 01/29/2018   Procedure: IRRIGATION AND DEBRIDEMENT THUMB;  Surgeon: Dayna Barker, MD;  Location: [facility];  Service: Plastics;  Laterality: Right;  . INCISE AND DRAIN ABCESS    . KIDNEY TRANSPLANT  1991  . KNEE ARTHROSCOPY WITH LATERAL MENISECTOMY Left 12/03/2017   Procedure: LEFT KNEE ARTHROSCOPY WITH LATERAL MENISECTOMY;  Surgeon: Earlie Server, MD;  Location: [place] [facility];  Service: Orthopedics;  Laterality: Left;  . LAPAROSCOPIC GASTRIC BANDING  04/2004; 10/'09,  2/'10   Port Replacement x 2  . LEFT HEART CATH AND CORS/GRAFTS ANGIOGRAPHY N/A 04/21/2018   Procedure: LEFT HEART CATH AND CORS/GRAFTS ANGIOGRAPHY;  Surgeon: [name], MD;  Location: [facility];  Service: Cardiovascular;  Laterality: N/A;  . LEFT HEART CATHETERIZATION WITH CORONARY/GRAFT ANGIOGRAM N/A 08/23/2013   Procedure: LEFT HEART CATHETERIZATION WITH Beatrix Fetters;  Surgeon: Wellington Hampshire, MD;  Location: [facility] CATH [facility];  Service: Cardiovascular;  Laterality: N/A;  . Lower Extremity Arterial Dopplers  08/2013   ABI: R 0.96, L 1.04  . MULTIPLE TOOTH EXTRACTIONS  age 59  . NM MYOVIEW LTD  03/2016   EF 62%. LOW RISK. C/W prior MI - no Ischemia. Apical hypokinesis.  [name] Kitchen PERCUTANEOUS CORONARY STENT INTERVENTION (PCI-S)  5/'01, 3/'02, 8/'03, 10/'04;   '01 - S660 BMS 2.5 x 9 - dSVG-D1 into D1; '02- post-stent stenosis - 2.5 x 8 Pixel BMS; '8\03: ISR/Thrombosis into native D1 -  AngioJet, 2.5 x 13 Pixel; '04 - ISR 95% - covered stented area with Taxus DES 2.5 mm x 20 (2.88)  . PERCUTANEOUS CORONARY STENT INTERVENTION (PCI-S)  08/23/2013   Procedure: PERCUTANEOUS CORONARY STENT INTERVENTION (PCI-S);  Surgeon: Wellington Hampshire, MD;  Location: [facility] CATH LAB;  Service: Cardiovascular;;mid SVG-D1 80%; distal stent ~50% ISR; Promus Prermier DES 2.75 mm xc 20 mm (2.8 mm)  . PORT-A-CATH REMOVAL     kidney  . TRANSTHORACIC ECHOCARDIOGRAM  02/2016   EF 55-60%. Septal dyssynergy from CABG GR 2 DD. Aortic valve is trileaflet the functional bicuspid with sclerosis but not yet notable for stenosis stenosis.; no Significant change  . TUBAL LIGATION    . wrist fistula repair Left    dialysis for one year    Current Outpatient Medications  Medication Sig Dispense Refill  . albuterol (PROVENTIL) (2.5 MG/3ML) 0.083% nebulizer solution Take 3 mLs (2.5 mg total) by nebulization every 6 (six) hours as needed for wheezing or shortness of breath (dx: J45.20). 75 mL 2  . arformoterol (BROVANA) 15  MCG/2ML NEBU Take 2 mLs (15 mcg total) by nebulization 2 (two) times daily. 120 mL 12  . azaTHIOprine (IMURAN) 50 MG tablet Take 125 mg by mouth every evening. Taking 2 & 1/2 tabs (125mg ) qd    . B-D ULTRAFINE III SHORT PEN 31G X 8 MM MISC See admin instructions.    . budesonide (PULMICORT) 0.5 MG/2ML nebulizer solution Take 2 mLs (0.5 mg total) by nebulization 2 (two) times daily. 360 mL 1  . calcitRIOL (ROCALTROL) 0.25 MCG capsule Take 0.25 mcg by mouth every 3 (three) days.     . carbamazepine (TEGRETOL) 200 MG tablet Take 1 tablet (200 mg total) by mouth 2 (two) times daily. 180 tablet 0  . clotrimazole (MYCELEX) 10 MG troche Take 1 tablet (10 mg total) by mouth 5 (five) times daily. 35 tablet 0  . cyclobenzaprine (FLEXERIL) 10 MG tablet TAKE 1 TABLET BY MOUTH 3 TIMES DAILY AS NEEDED FOR MUSCLE SPASMS (Patient taking differently: Take 10 mg by mouth 2 (two) times daily. ) 90 tablet 2  . diltiazem (CARDIZEM CD) 120 MG 24 hr capsule Take 1 capsule (120 mg total) by mouth daily. 30 capsule 2  . dofetilide (TIKOSYN) 250 MCG capsule TAKE 1 CAPSULE BY MOUTH TWICE A DAY 180 capsule 2  . DULoxetine (CYMBALTA) 30 MG capsule TAKE 2 CAPSULES (60 MG TOTAL) BY MOUTH EVERY MORNING. (Patient taking differently: Take 60 mg by mouth daily. ) 180 capsule 2  . famotidine (PEPCID) 20 MG tablet Take 20 mg by mouth daily.    . ferrous sulfate 325 (65 FE) MG tablet Take 1 tablet (325 mg total) by mouth daily with breakfast. 60 tablet 0  . fluticasone (FLONASE) 50 MCG/ACT nasal spray PLACE 2 SPRAYS INTO BOTH NOSTRILS 2 TIMES DAILY (Patient taking differently: Place 2 sprays into both nostrils 2 (two) times daily. ) 48 mL 3  . glimepiride (AMARYL) 4 MG tablet Take 4 mg by mouth daily.    [name] Kitchen guaiFENesin (MUCINEX) 600 MG 12 hr tablet Take 1 tablet (600 mg total) by mouth 2 (two) times daily. (Patient taking differently: Take 600 mg by mouth as needed. ) 30 tablet 0  . Insulin Glargine (BASAGLAR KWIKPEN) 100 UNIT/ML SOPN  Inject 12 Units into the skin daily.    [name] Kitchen LORazepam (ATIVAN) 1 MG tablet Take 1 mg by mouth daily as needed for anxiety. Takes daily    . metFORMIN (GLUCOPHAGE) 500 MG  tablet Take 500 mg by mouth 2 (two) times daily.  6  . metoprolol tartrate (LOPRESSOR) 50 MG tablet Take 1 tablet (50 mg total) by mouth 2 (two) times daily. 60 tablet 6  . montelukast (SINGULAIR) 10 MG tablet TAKE 1 TABLET BY MOUTH EVERY DAY (Patient taking differently: Take 10 mg by mouth daily. TAKE 1 TABLET BY MOUTH EVERY DAY) 90 tablet 1  . nitroGLYCERIN (NITROSTAT) 0.4 MG SL tablet Place 1 tablet (0.4 mg total) under the tongue every 5 (five) minutes as needed for chest pain. 25 tablet 6  . NOVOLOG FLEXPEN 100 UNIT/ML FlexPen Inject 4 Units into the skin 2 (two) times daily after a meal.   6  . ONETOUCH ULTRA test strip USE AS DIRECTED 3 TIMES DAILY TO CHECK BLOOD SUGAR    . Oxycodone HCl 10 MG TABS Take 1 tablet (10 mg total) by mouth every 8 (eight) hours as needed. (Patient taking differently: Take 10 mg by mouth every 8 (eight) hours as needed. Taking 1 table by mouth t in the am and one tablet in the evening) 60 tablet 0  . predniSONE (DELTASONE) 5 MG tablet Take 5 mg by mouth daily.    . promethazine (PHENERGAN) 25 MG tablet Take 25 mg by mouth at bedtime. Or for sleep    . rosuvastatin (CRESTOR) 20 MG tablet Take 1 tablet (20 mg total) by mouth daily. 90 tablet 3  . sertraline (ZOLOFT) 100 MG tablet Take 200 mg by mouth daily.    . sertraline (ZOLOFT) 50 MG tablet TAKE 1 TABLET BY MOUTH EVERY DAY IN THE MORNING 90 tablet 2  . sodium bicarbonate 650 MG tablet Take 1,300 mg by mouth 2 (two) times daily.    . VENTOLIN HFA 108 (90 Base) MCG/ACT inhaler TAKE 2 PUFFS BY MOUTH EVERY 6 HOURS AS NEEDED FOR WHEEZE OR SHORTNESS OF BREATH (Patient taking differently: Inhale 2 puffs into the lungs every 6 (six) hours as needed for wheezing or shortness of breath. ) 18 g 5  . warfarin (COUMADIN) 5 MG tablet Take 1 tablet (5 mg total) by  mouth daily. 90 tablet 3   No current facility-administered medications for this encounter.    Allergies  Allergen Reactions  . Tetracycline Hives    Patient tolerated Doxycycline Dec 2020  . Hydromorphone Hcl Nausea And Vomiting  . Niacin Other (See Comments)    Mouth blisters  . Niaspan [Niacin Er] Other (See Comments)    Mouth blisters  . Sulfa Antibiotics Nausea Only and Other (See Comments)    "Tears up stomach"  . Sulfonamide Derivatives Other (See Comments)    Reaction: per patient "tears her stomach up"  . Codeine Nausea And Vomiting  . Erythromycin Nausea And Vomiting  . Hydromorphone Hcl Nausea And Vomiting  . Morphine And Related Nausea And Vomiting  . Nalbuphine Nausea And Vomiting  . Sulfasalazine Nausea Only and Other (See Comments)    per patient "tears her stomach up", "Tears up stomach"  . Tape Rash and Other (See Comments)    No "plastic" tape," please    Social History   Socioeconomic History  . Marital status: Widowed    Spouse name: Not on file  . Number of children: Not on file  . Years of education: Not on file  . Highest education level: Not on file  Occupational History  . Not on file  Tobacco Use  . Smoking status: Former Smoker    Packs/day: 1.00    Years:  30.00    Pack years: 30.00    Types: Cigarettes    Quit date: 08/17/2002    Years since quitting: 17.2  . Smokeless tobacco: Never Used  Substance and Sexual Activity  . Alcohol use: No  . Drug use: No  . Sexual activity: Not on file  Other Topics Concern  . Not on file  Social History Narrative   She is currently married, and the caregiver of her husband who is recovering from surgery for tongue cancer now diagnosed with lung cancer. Prior to his diagnosis of her husband, she actually had adopted a 51-year-old child who she knows caring for as well. With all the surgeries, they have been quite financially troubled. Thanks the help of her community and church, they have been able to  stay "alfoat."     She is a former smoker who quit in 2004 after a 30-pack-year history.   She is active chasing a 76-year-old child, does not do routine exercise. She's been quite depressed with the condition of her husband, and admits to eating comfort herself.   She does not drink alcohol.   Social Determinants of Health   Financial Resource Strain:   . Difficulty of Paying Living Expenses:   Food Insecurity:   . Worried About Charity fundraiser in the Last Year:   . Arboriculturist in the Last Year:   Transportation Needs:   . Film/video editor (Medical):   [name] Kitchen Lack of Transportation (Non-Medical):   Physical Activity:   . Days of Exercise per Week:   . Minutes of Exercise per Session:   Stress:   . Feeling of Stress :   Social Connections:   . Frequency of Communication with Friends and Family:   . Frequency of Social Gatherings with Friends and Family:   . Attends Religious Services:   . Active Member of Clubs or Organizations:   . Attends Archivist Meetings:   [name] Kitchen Marital Status:   Intimate Partner Violence:   . Fear of Current or Ex-Partner:   . Emotionally Abused:   [name] Kitchen Physically Abused:   . Sexually Abused:     Family History  Problem Relation Age of Onset  . Cancer Mother        liver  . Heart disease Father   . Cancer Father        colon  . Arrhythmia Brother        Atrial Fibrillation  . Arrhythmia Paternal Aunt        Atrial Fibrillation    ROS- All systems are reviewed and negative except as per the HPI above  Physical Exam: Vitals:   11/16/19 1424  BP: (!) 148/84  Pulse: 68  Weight: 100.2 kg  Height: 5\' 1"  (1.549 m)   Wt Readings from Last 3 Encounters:  11/16/19 100.2 kg  11/15/19 101.2 kg  08/24/19 106.6 kg    Labs: Lab Results  Component Value Date   NA 135 08/23/2019   K 4.5 08/23/2019   CL 103 08/23/2019   CO2 24 08/23/2019   GLUCOSE 217 (H) 08/23/2019   BUN 17 08/23/2019   CREATININE 0.77 08/23/2019   CALCIUM  8.6 08/23/2019   PHOS 3.9 07/23/2019   MG 2.0 08/13/2019   Lab Results  Component Value Date   INR 2.3 11/14/2019   Lab Results  Component Value Date   CHOL 111 11/30/2017   HDL 53.90 11/30/2017   LDLCALC 32 11/30/2017   TRIG 129.0  11/30/2017     GEN- The patient is well appearing, alert and oriented x 3 today.   Head- normocephalic, atraumatic Eyes-  Sclera clear, conjunctiva pink Ears- hearing intact Oropharynx- clear Neck- supple, no JVP Lymph- no cervical lymphadenopathy Lungs- Clear to ausculation bilaterally, normal work of breathing Heart- Regular rate and rhythm, no murmurs, rubs or gallops, PMI not laterally displaced GI- soft, NT, ND, + BS Extremities- no clubbing, cyanosis, or edema MS- no significant deformity or atrophy Skin- no rash or lesion Psych- euthymic mood, full affect Neuro- strength and sensation are intact  EKG-  NSR at 68 bpm, PR int 172 ms, qtrs int 144 ms, qtc 499 ms (stable) Epic records reviewed    Assessment and Plan: 1. Paroxysmal Afib  Maintaining SR Continue Tikosyn 250 mcg bid  General review of precautions with Tikosyn discussed  Magnesium today   2. CHA2DS2VASc of at least 5 Continue warfarin with next INR scheduled 9/23  3. CAD Stable   Recheck in 6 months   Geroge Baseman. Avelina Mcclurkin, [facility] [address] [place], [place] [address] [phone]

## 2019-11-18 ENCOUNTER — Other Ambulatory Visit: Payer: Self-pay | Admitting: Emergency Medicine

## 2019-11-20 ENCOUNTER — Telehealth: Payer: Self-pay | Admitting: Emergency Medicine

## 2019-11-20 NOTE — Telephone Encounter (Signed)
Pt returning a phone call. Pt can be reached at [phone].

## 2019-11-20 NOTE — Telephone Encounter (Signed)
Spoke with pt, she would like to know what allergy medications she could take that wouldn't interfere with her other medications. RB please advise.

## 2019-11-20 NOTE — Telephone Encounter (Signed)
LMTCB x 1 

## 2019-11-21 ENCOUNTER — Ambulatory Visit (INDEPENDENT_AMBULATORY_CARE_PROVIDER_SITE_OTHER): Payer: Medicare Other | Admitting: Pharmacist

## 2019-11-21 ENCOUNTER — Telehealth: Payer: Self-pay | Admitting: *Deleted

## 2019-11-21 DIAGNOSIS — I480 Paroxysmal atrial fibrillation: Secondary | ICD-10-CM | POA: Diagnosis not present

## 2019-11-21 DIAGNOSIS — Z7901 Long term (current) use of anticoagulants: Secondary | ICD-10-CM

## 2019-11-21 LAB — POCT INR: INR: 2.9 (ref 2.0–3.0)

## 2019-11-21 NOTE — Telephone Encounter (Signed)
Spoke with patient. She is aware of the recommendations and verbalized understanding. Nothing further needed at time of call.

## 2019-11-21 NOTE — Telephone Encounter (Signed)
Patient left a message stating that she would like to come of her tegretol. She states this needs to happen so she can come off warfarin and be switched to another blood thinner.

## 2019-11-21 NOTE — Telephone Encounter (Signed)
Reduce to night time for 4 days then stop

## 2019-11-21 NOTE — Telephone Encounter (Signed)
She can use one of the non-sedating anti-histamines like Claritin, Allegra, Zyrtec. Loratadine 10mg  qd is inexpensive at [employer], [employer], etc.

## 2019-11-21 NOTE — Telephone Encounter (Signed)
Patient notified. Patient performed read back.  Patient verbalized understanding

## 2019-11-22 ENCOUNTER — Ambulatory Visit: Payer: Medicare Other | Admitting: Physical Medicine & Rehabilitation

## 2019-11-28 ENCOUNTER — Ambulatory Visit (INDEPENDENT_AMBULATORY_CARE_PROVIDER_SITE_OTHER): Payer: Medicare Other | Admitting: Pharmacist

## 2019-11-28 ENCOUNTER — Telehealth: Payer: Self-pay

## 2019-11-28 DIAGNOSIS — I480 Paroxysmal atrial fibrillation: Secondary | ICD-10-CM | POA: Diagnosis not present

## 2019-11-28 DIAGNOSIS — Z7901 Long term (current) use of anticoagulants: Secondary | ICD-10-CM | POA: Diagnosis not present

## 2019-11-28 LAB — POCT INR: INR: 3.3 — AB (ref 2.0–3.0)

## 2019-11-28 MED ORDER — APIXABAN 5 MG PO TABS: 5.0000 mg | ORAL_TABLET | Freq: Two times a day (BID) | ORAL | 3 refills | Status: DC

## 2019-11-28 NOTE — Telephone Encounter (Signed)
Patient called stating she would like for Dr. Naaman Plummer and no other staff call her back. Wants to speak to Dr. Naaman Plummer about weaning off Tegretol.

## 2019-11-28 NOTE — Telephone Encounter (Signed)
I spoke to patient at length. There may not be an option as far as an anticonvulsant that she can tolerate which can be taken with eliquis. Did a good amount of research, and most reduce plasma concentration of eliquis. She will talk with cardiology about lamictal or keppra as options, but i'm afraid they fall into the same category as well.

## 2019-11-30 ENCOUNTER — Telehealth: Payer: Self-pay | Admitting: *Deleted

## 2019-11-30 ENCOUNTER — Other Ambulatory Visit: Payer: Self-pay | Admitting: Nurse Practitioner

## 2019-11-30 NOTE — Telephone Encounter (Signed)
I asked her to follow up with her cardiologist regarding other options for neuropathic pain given that she might change to eliquis. ( I gave her a couple medication names in fact) I literally just talked to her two days ago.

## 2019-11-30 NOTE — Telephone Encounter (Signed)
Patient left a message for Dr. Naaman Plummer.  She states her pain is not being covered  with a frequency of BID and is asking Dr. Naaman Plummer if she can increase to TID.

## 2019-12-04 MED ORDER — LAMOTRIGINE 25 MG PO TABS: 25.0000 mg | ORAL_TABLET | Freq: Every day | ORAL | 2 refills | Status: DC

## 2019-12-04 NOTE — Telephone Encounter (Signed)
Patient called stating she talked to her Kidney doctor and wants to discuss with you what they talked about.

## 2019-12-04 NOTE — Telephone Encounter (Signed)
Will try low dose lamictal at night 25mg . Spoke to patient. Med sent to her pharmacy. thanks

## 2019-12-05 ENCOUNTER — Telehealth: Payer: Self-pay | Admitting: Emergency Medicine

## 2019-12-05 DIAGNOSIS — J449 Chronic obstructive pulmonary disease, unspecified: Secondary | ICD-10-CM

## 2019-12-05 NOTE — Telephone Encounter (Signed)
Spoke with Carolynn Serve with [employer]  She states tried to titrate the pt on POC  She started at at 93%3lpm pulsed o2 and she walked x approx 4 min and sats were 88-89% 3lpm pulsed o2  She states that during the walk pt stopped multiple times due to pain  She wanted to call and see if RB would be okay with the POC b/c she feels that if pt does not get this she may not use o2 at all.  She was tearful and upset during walk in fear of not qualifying  Please advise, thanks

## 2019-12-05 NOTE — Telephone Encounter (Signed)
Spoke with [employer]. She is aware that RB has approved the request. She has requested that we send an order for documentation purposes. I advised her that I would place the order today, she verbalized understanding.   Nothing further needed at time of call.

## 2019-12-05 NOTE — Telephone Encounter (Signed)
Yes I am ok with her using 3L/min pulsed flow

## 2019-12-05 NOTE — Telephone Encounter (Signed)
I called Katherine [name] but there was no answer. LM for her to call back

## 2019-12-06 ENCOUNTER — Ambulatory Visit (INDEPENDENT_AMBULATORY_CARE_PROVIDER_SITE_OTHER): Payer: Medicare Other | Admitting: Cardiology

## 2019-12-06 ENCOUNTER — Other Ambulatory Visit: Payer: Self-pay

## 2019-12-06 ENCOUNTER — Encounter: Payer: Self-pay | Admitting: Cardiology

## 2019-12-06 VITALS — BP 144/73 | HR 74 | Temp 97.1°F | Resp 20 | Ht 61.0 in | Wt 216.8 lb

## 2019-12-06 DIAGNOSIS — I2119 ST elevation (STEMI) myocardial infarction involving other coronary artery of inferior wall: Secondary | ICD-10-CM | POA: Diagnosis not present

## 2019-12-06 DIAGNOSIS — I480 Paroxysmal atrial fibrillation: Secondary | ICD-10-CM | POA: Diagnosis not present

## 2019-12-06 DIAGNOSIS — E785 Hyperlipidemia, unspecified: Secondary | ICD-10-CM

## 2019-12-06 DIAGNOSIS — I152 Hypertension secondary to endocrine disorders: Secondary | ICD-10-CM

## 2019-12-06 DIAGNOSIS — Z951 Presence of aortocoronary bypass graft: Secondary | ICD-10-CM | POA: Diagnosis not present

## 2019-12-06 DIAGNOSIS — I25119 Atherosclerotic heart disease of native coronary artery with unspecified angina pectoris: Secondary | ICD-10-CM

## 2019-12-06 DIAGNOSIS — Z7901 Long term (current) use of anticoagulants: Secondary | ICD-10-CM | POA: Diagnosis not present

## 2019-12-06 DIAGNOSIS — I350 Nonrheumatic aortic (valve) stenosis: Secondary | ICD-10-CM | POA: Diagnosis not present

## 2019-12-06 DIAGNOSIS — I739 Peripheral vascular disease, unspecified: Secondary | ICD-10-CM

## 2019-12-06 DIAGNOSIS — I10 Essential (primary) hypertension: Secondary | ICD-10-CM | POA: Diagnosis not present

## 2019-12-06 DIAGNOSIS — E1159 Type 2 diabetes mellitus with other circulatory complications: Secondary | ICD-10-CM | POA: Diagnosis not present

## 2019-12-06 DIAGNOSIS — I6521 Occlusion and stenosis of right carotid artery: Secondary | ICD-10-CM

## 2019-12-06 DIAGNOSIS — I358 Other nonrheumatic aortic valve disorders: Secondary | ICD-10-CM

## 2019-12-06 NOTE — Patient Instructions (Signed)
Medication Instructions:  The current medical regimen is effective;  continue present plan and medications as directed. Please refer to the Current Medication list given to you today. *If you need a refill on your cardiac medications before your next appointment, please call your pharmacy*  Lab Work: [place] If you have labs (blood work) drawn today and your tests are completely normal, you will receive your results only by:  [place] (if you have MyChart) OR A paper copy in the mail.  If you have any lab test that is abnormal or we need to change your treatment, we will call you to review the results. You may go to any [facility] that is convenient for you however, we do have a lab in our office that is able to assist you. You DO NOT need an appointment for our lab. The lab is open 8:00am and closes at 4:00pm. Lunch 12:45 - 1:45pm.  Follow-Up: Your next appointment:  6 month(s) Please call our office 2 months in advance to schedule this appointment In Person with [name], MD.  At [facility], you and your health needs are our priority.  As part of our continuing mission to provide you with exceptional heart care, we have created designated Provider Care Teams.  These Care Teams include your primary Cardiologist (physician) and Advanced Practice Providers (APPs -  Physician Assistants and Nurse Practitioners) who all work together to provide you with the care you need, when you need it.

## 2019-12-06 NOTE — Progress Notes (Signed)
Primary Care Provider: [name], MD Cardiologist: [name], MD Electrophysiologist: None  Clinic Note: Chief Complaint  Patient presents with  . Follow-up    Has been over 2 years  . Coronary Artery Disease    No classic angina symptoms.  . Atrial Fibrillation    Maintaining sinus rhythm on Tikosyn.  Converted to apixaban.    HPI:    Amaree Loisel is a 64 y.o. female with a PMH below who presents today for delayed follow-up - has not been seen here since early 2019.  Arizbeth Cawthorn is a former patient of Dr. Terance Ice with a history of CAD-CABG followed by PCI as well as PAD/carotid disease and PAF.   CARDIAC HISTORY:   1994 -->  Inferior-posterior STEMI-> 3V CAD: LAD-D1 bifurcation & RCA -> emergent PTCA of infarct related artery (RCA) -->   eventually went for for CABGx3: LIMA-LAD, SVG-D1, SVG-RPDA.  Multiple Caths: 2001, 2002, 2003 than 2004 all with lesions in the SVG-D1.  Initial stent followed by's are followed by stent thrombosis..  Cath-PCI for UA was in January 2015: Native LAD and RCA 100% occluded.  LIMA-LAD and SVG-RPDA patent.  LCx - OM1 60% & ost OM 2 50% with normal OM 3.  SVG-D1 had 80% mid and 50% distal ISR --> 1- PCI with a Promus Premier DES 2.75 mm x 20 mm.  Myoview August 2017 for preop: EF 60%.  Small anteroapical infarct but no ischemia.  Mild apical hypokinesis.  EF 62%.  LOW RISK  Cath 04/2018 - stable findings; RCA & LAD-TO, Stable small OM1 90%. SVG-D1 ~20% distal insertion Stent ISR (otw patent), Patent SVG-dRCA (did not see LIMA). R CFA subtotal stenosis.   This was for recurrent Afib RVR --> had Angina-type pain with Afib  EP consult - 9/23 - Tikosyn load.;   CHA2DS2Vasc Score (HTN, DM, F, CAD, CVA = 6) =>Warfarin. -> converted to Eliquis  Echo 07/2019: CHA2DS2VASc EF 55 to 60%.  No LVH.  Paradoxical septal wall motion-post CABG.  GRII DD.  Normal RV size and function.  Mild bilateral atrial dilation.  Moderate  MAC.  Trace MR.  Mild aortic stenosis (gradients: Mean 14.3 mmHg -peak 24.9 mmHg).  Jayonna Meyering was last seen here in clinic by me in February 2019 -> this was preop for left knee surgery.  She was recovering from her URI and diarrhea related to LAP-BAND adjustment.  Otherwise stable cardiac standpoint.  No angina.  Noted some fatigue related to her URI along with nausea vomiting and diarrhea..  --She has had several follow-up visits in the A. fib clinic  04/18/2019: No exertional chest pain.  All nonexertional.  Otherwise stable.  Tikosyn refilled.  Maintaining sinus rhythm.  Noted lots of back and hip pain.  Had back injection.  November 16, 2019: Tikosyn surveillance.  Beta-blocker has been increased to 50 mg twice daily-less flutters. -->  Converted to apixaban on April 6.  No longer on Tegretol  Recent Hospitalizations: none since Dec 2020 - admitted with PNA.  Reviewed  CV studies:    The following studies were reviewed today: (if available, images/films reviewed: From Epic Chart or Care Everywhere) . 04/2018 - Cardiac Cath (Image below):  Ost-Prox LAD 50% - proxLAD (pre & post D1) 100% CTO. Cx - patent, small OM1 (stable ~ ostial OM1 90%, too small for PCI) & 2 small LPL; Ost-distal RCA 100% CTO.  LIMA-LAD (not injected); SVG-dRCA patent, SVG-D1 - insertion site stent ~20%  ISR - Severe R CFA disease with focal subtotal occlusion.  . 07/2019 - Echo  (with PNA): EF 55 to 60%.  No LVH.  Paradoxical septal wall motion-post CABG.  GRII DD.  Normal RV size and function.  Mild bilateral atrial dilation.  Moderate MAC.  Trace MR.  Mild aortic stenosis (gradients: Mean 14.3 mmHg -peak 24.9 mmHg).   Interval History:   Silvie Obremski is here today for delayed follow-up.  She says she feels an occasional squeezing her chest and sometimes some left arm discomfort and tingling.  This is with or without exertion, and no rhyme or reason the timing.  It usually occurs when she is tired or she has  been pushing herself for a while.  It does not usually happen with routine activity.  She is happy to be on Eliquis, having switched her seizure medicine from Tegretol to Lamictal She has not felt any recurrence of A. fib either rapid or irregular heartbeats.  No PND orthopnea or edema.  Besides the shortness of breath that she had when she was in the hospital and have been on oxygen for a while, she has been doing okay as far as dyspnea goes.  Has not really been having that much the way of any LAP-BAND issues either.  Still deconditioned and limited by arthritis pains, but is trying to be more active.  She is happy because of the weight loss and this definitely helps her hips and joints.  CV Review of Symptoms (Summary) Cardiovascular ROS: positive for - Occasional chest squeezing or left arm discomfort, no rhyme or reason.  Baseline exertional dyspnea and fatigue. negative for - edema, irregular heartbeat, orthopnea, palpitations, paroxysmal nocturnal dyspnea, rapid heart rate or Syncope/near syncope, TIA/amaurosis fugax, claudication  The patient does not have symptoms concerning for COVID-19 infection (fever, chills, cough, or new shortness of breath).  The patient is practicing social distancing & Masking.    REVIEWED OF SYSTEMS   Review of Systems  Constitutional: Positive for weight loss (_0  none 30 pounds in the last year or so.).  HENT: Negative for congestion and nosebleeds.   Respiratory: Positive for shortness of breath (Baseline).   Gastrointestinal: Positive for abdominal pain. Negative for blood in stool and melena.       Loose stools or constipation depending on how her LAP-BAND is doing.  Genitourinary: Negative for hematuria.  Musculoskeletal: Positive for back pain and joint pain.  Neurological: Positive for weakness (Mild generalized). Negative for dizziness, focal weakness and headaches.  Endo/Heme/Allergies: Negative for environmental allergies.  Does not bruise/bleed easily.  Psychiatric/Behavioral: Negative for depression (Not really depressed, but emotionally taxed by significant loss in the family as noted in social history.) and memory loss. The patient is not nervous/anxious and does not have insomnia.    I have reviewed and (if needed) personally updated the patient's problem list, medications, allergies, past medical and surgical history, social and family history.   PAST MEDICAL HISTORY   Past Medical History:  Diagnosis Date  . Anemia   . Anxiety   . Bilateral carotid artery stenosis    Carotid duplex 10/7626: 3-15% LICA, 17-61% RICA, >60% RECA, f/u 1 yr suggested  . CAD (coronary artery disease) of bypass graft 5/01; 3/'02, 8/'03, 10/'04; 1/15   PCI x 5 to SVG-D1   . CAD in native artery 07/1993   3 Vessel Disease (LAD-D1 & RCA) -- CABG (Dx in setting of inferior STEMI-PTCA of RCA)  . CAD  S/P percutaneous coronary angioplasty    PCI to SVG-D1 insertion/native D1 x 4 = '01 -(S660 BMS 2.5 x 9 anastomosis- D1); '02 - distal overlap ACS Pixel 2.5 x 8  BMS; '03 distal/native ISR/Thrombosis - Pixel 2.5 x 13; '04 - ISR-  Taxus 2.5 x 20 (covered all);; 1/15 - mid SVG-D1 (50% distal ISR) - Promus P 2.75 x 20 -- 2.8 mm  . COPD mixed type ([place])    Followed by Dr. Lamonte Sakai "pulmonologist said no COPD"  . Depression   . Depression with anxiety   . Diabetes mellitus type 2 in obese ([place])   . Diarrhea    started after cholecystectomy and mass removed from intestine  . Dyslipidemia, goal LDL below 70    08/2012: TC 137, TG 200, HDL 32!, LDL 45; on statin (followed by Dr.Deterding)  . ESRD (end stage renal disease) ([place]) 1991   s/p Cadaveric Renal Transplant [facility] - Dr. Jimmy Footman)   . Family history of adverse reaction to anesthesia    mom's bp dropped during/after anesthesia  . Fibromyalgia   . GERD (gastroesophageal reflux disease)   . Glomerulonephritis, chronic, rapidly progressive 14  . H/O ST elevation myocardial infarction  (STEMI) of inferoposterior wall 07/1993   Rescue PTCA of RCA -- referred for CABG.  . H/O: GI bleed   . Headache    migraines in the past  . History of CABG x 3 08/1993   Dr. Servando Snare: LIMA-LAD, SVG-bifurcatingD1, SVG-rPDA  . History of kidney stones   . History of stroke 2012   "right eye stroke- half blind now"  . Hypertension associated with diabetes ([place])   . Mild aortic stenosis by prior echocardiogram 07/2019   Echo:  Mild aortic stenosis (gradients: Mean 14.3 mmHg -peak 24.9 mmHg).  . Morbid obesity ([place])   . MRSA (methicillin resistant staph aureus) culture positive   . OSA (obstructive sleep apnea)    no longer on CPAP or home O2, states she doesn't need now after lap band  . PAD (peripheral artery disease) ([place]) 08/2013   LEA Dopplers to be read by Dr. Fletcher Anon  . PAF (paroxysmal atrial fibrillation) ([place]) 06/2014   Noted on CardioNet Monitor  - --> rhythm control with Tikosyn (Dr. Rayann Heman); converted from warfarin to apixaban for anticoagulation.  . Pneumonia   . Recurrent boils    Bilateral Groin  . Rheumatoid arthritis ([place])    Per Patient Report; associated with OA  . S/p cadaver renal transplant [place]  . Unstable angina ([place]) 5/01; 3/'02, 8/'03, 10/'04; 1/15   x 5 occurences since Inf-Post STEMI in 1994  * CVA  She has a history of being morbidly obese with a history of lap band surgery with very labile weights based on whether or not her band is tight and or not.   She is s/p Renal transplant for ESRD dating back to 27.   COPD, poorly controlled diabetes mellitus, type II as well as hypertension and hyperlipidemia  PAST SURGICAL HISTORY   Past Surgical History:  Procedure Laterality Date  . ABDOMINAL AORTOGRAM N/A 04/21/2018   Procedure: ABDOMINAL AORTOGRAM;  Surgeon: [name], MD;  Location: [facility];  Service: Cardiovascular;  Laterality: N/A;  . CATHETER REMOVAL    . CHOLECYSTECTOMY N/A 10/29/2014   Procedure: LAPAROSCOPIC  CHOLECYSTECTOMY WITH INTRAOPERATIVE CHOLANGIOGRAM;  Surgeon: Excell Seltzer, MD;  Location: [facility];  Service: General;  Laterality: N/A;  . CORONARY ANGIOPLASTY  1994   x5  .  CORONARY ARTERY BYPASS GRAFT  1995   LIMA-LAD, SVG-RPDA, SVG-D1  . ESOPHAGOGASTRODUODENOSCOPY N/A 10/15/2016   Procedure: ESOPHAGOGASTRODUODENOSCOPY (EGD);  Surgeon: Wilford Corner, MD;  Location: [facility] ENDOSCOPY;  Service: Endoscopy;  Laterality: N/A;  . I & D EXTREMITY Right 01/29/2018   Procedure: IRRIGATION AND DEBRIDEMENT THUMB;  Surgeon: Dayna Barker, MD;  Location: [facility];  Service: Plastics;  Laterality: Right;  . INCISE AND DRAIN ABCESS    . KIDNEY TRANSPLANT  1991  . KNEE ARTHROSCOPY WITH LATERAL MENISECTOMY Left 12/03/2017   Procedure: LEFT KNEE ARTHROSCOPY WITH LATERAL MENISECTOMY;  Surgeon: Earlie Server, MD;  Location: [facility];  Service: Orthopedics;  Laterality: Left;  . LAPAROSCOPIC GASTRIC BANDING  04/2004; 10/'09, 2/'10   Port Replacement x 2  . LEFT HEART CATH AND CORS/GRAFTS ANGIOGRAPHY N/A 04/21/2018   Procedure: LEFT HEART CATH AND CORS/GRAFTS ANGIOGRAPHY;  Surgeon: [name], MD;  Location: [facility] [facility];  Ost-Prox LAD 50% - proxLAD (pre & post D1) 100% CTO. Cx - patent, small OM1 (stable ~ ostial OM1 90%, too small for PCI) & 2 small LPL; Ost-distal RCA 100% CTO.  LIMA-LAD (not injected); SVG-dRCA patent, SVG-D1 - insertion stent ~20% ISR - Severe R CFA disease w/ focal Sub TO  . LEFT HEART CATH AND CORS/GRAFTS ANGIOGRAPHY  5/'01, 3/'02, 8/'03, 10/'04; 1/'15   08/22/2013: LAD & RCA 100%; LIMA-LAD & SVG-rPDA patent; Cx-- OM1 60%, OM2 ostial ~50%; SVG-D1 - 80% mid, 50% distal ISR --PCI  . LEFT HEART CATHETERIZATION WITH CORONARY/GRAFT ANGIOGRAM N/A 08/23/2013   Procedure: LEFT HEART CATHETERIZATION WITH Beatrix Fetters;  Surgeon: Wellington Hampshire, MD;  Location: [facility] CATH LAB;  Service: Cardiovascular;  Laterality: N/A;  . Lower Extremity Arterial Dopplers  08/2013   ABI: R 0.96, L 1.04  .  MULTIPLE TOOTH EXTRACTIONS  age 13  . NM MYOVIEW LTD  03/2016   EF 62%. LOW RISK. C/W prior MI - no Ischemia. Apical hypokinesis.  [name] Kitchen PERCUTANEOUS CORONARY STENT INTERVENTION (PCI-S)  5/'01, 3/'02, 8/'03, 10/'04;   '01 - S660 BMS 2.5 x 9 - dSVG-D1 into D1; '02- post-stent stenosis - 2.5 x 8 Pixel BMS; '8\03: ISR/Thrombosis into native D1 - AngioJet, 2.5 x 13 Pixel; '04 - ISR 95% - covered stented area with Taxus DES 2.5 mm x 20 (2.88)  . PERCUTANEOUS CORONARY STENT INTERVENTION (PCI-S)  08/23/2013   Procedure: PERCUTANEOUS CORONARY STENT INTERVENTION (PCI-S);  Surgeon: Wellington Hampshire, MD;  Location: [facility] CATH LAB;  Service: Cardiovascular;;mid SVG-D1 80%; distal stent ~50% ISR; Promus [place] DES 2.75 mm xc 20 mm (2.8 mm)  . PORT-A-CATH REMOVAL     kidney  . TRANSTHORACIC ECHOCARDIOGRAM  07/2019   EF 55 to 60%.  No LVH.  Paradoxical septal wall motion-post CABG.  GRII DD.  Normal RV size and function.  Mild bilateral atrial dilation.  Moderate MAC.  Trace MR.  Mild aortic stenosis (gradients: Mean 14.3 mmHg -peak 24.9 mmHg).  . TUBAL LIGATION    . wrist fistula repair Left    dialysis for one year   Diagnostic     MEDICATIONS/ALLERGIES   Current Meds  Medication Sig  . albuterol (PROVENTIL) (2.5 MG/3ML) 0.083% nebulizer solution Take 3 mLs (2.5 mg total) by nebulization every 6 (six) hours as needed for wheezing or shortness of breath (dx: J45.20).  [name] Kitchen apixaban (ELIQUIS) 5 MG TABS tablet Take 1 tablet (5 mg total) by mouth 2 (two) times daily.  [name] Kitchen arformoterol (BROVANA) 15 MCG/2ML NEBU Take 2 mLs (15 mcg  total) by nebulization 2 (two) times daily.  [name] Kitchen azaTHIOprine (IMURAN) 50 MG tablet Take 125 mg by mouth every evening. Taking 2 & 1/2 tabs (121m) qd  . B-D ULTRAFINE III SHORT PEN 31G X 8 MM MISC See admin instructions.  . budesonide (PULMICORT) 0.5 MG/2ML nebulizer solution Take 2 mLs (0.5 mg total) by nebulization 2 (two) times daily.  . calcitRIOL (ROCALTROL) 0.25 MCG capsule Take 0.25  mcg by mouth every 3 (three) days.   . clotrimazole (MYCELEX) 10 MG troche Take 1 tablet (10 mg total) by mouth 5 (five) times daily.  . cyclobenzaprine (FLEXERIL) 10 MG tablet TAKE 1 TABLET BY MOUTH 3 TIMES DAILY AS NEEDED FOR MUSCLE SPASMS (Patient taking differently: Take 10 mg by mouth 2 (two) times daily. )  . dofetilide (TIKOSYN) 250 MCG capsule TAKE 1 CAPSULE BY MOUTH TWICE A DAY  . DULoxetine (CYMBALTA) 30 MG capsule TAKE 2 CAPSULES (60 MG TOTAL) BY MOUTH EVERY MORNING. (Patient taking differently: Take 60 mg by mouth daily. )  . famotidine (PEPCID) 20 MG tablet Take 20 mg by mouth daily.  . ferrous sulfate 325 (65 FE) MG tablet Take 1 tablet (325 mg total) by mouth daily with breakfast.  . fluticasone (FLONASE) 50 MCG/ACT nasal spray PLACE 2 SPRAYS INTO BOTH NOSTRILS 2 TIMES DAILY (Patient taking differently: Place 2 sprays into both nostrils 2 (two) times daily. )  . glimepiride (AMARYL) 4 MG tablet Take 4 mg by mouth daily.  .[name] KitchenguaiFENesin (MUCINEX) 600 MG 12 hr tablet Take 1 tablet (600 mg total) by mouth 2 (two) times daily. (Patient taking differently: Take 600 mg by mouth as needed. )  . Insulin Glargine (BASAGLAR KWIKPEN) 100 UNIT/ML SOPN Inject 12 Units into the skin daily.  .[name] KitchenlamoTRIgine (LAMICTAL) 25 MG tablet Take 1 tablet (25 mg total) by mouth at bedtime.  .[name] KitchenLORazepam (ATIVAN) 1 MG tablet Take 1 mg by mouth daily as needed for anxiety. Takes daily  . metFORMIN (GLUCOPHAGE) 500 MG tablet Take 500 mg by mouth 2 (two) times daily.  . metoprolol tartrate (LOPRESSOR) 25 MG tablet TAKE 1 TABLET BY MOUTH 2 (TWO) TIMES DAILY. MAY TAKE EXTRA TABLET DAILY FOR BREAKTHROUGH AFIB  . metoprolol tartrate (LOPRESSOR) 50 MG tablet Take 1 tablet (50 mg total) by mouth 2 (two) times daily.  . montelukast (SINGULAIR) 10 MG tablet TAKE 1 TABLET BY MOUTH EVERY DAY  . nitroGLYCERIN (NITROSTAT) 0.4 MG SL tablet Place 1 tablet (0.4 mg total) under the tongue every 5 (five) minutes as needed for chest  pain.  .[name] KitchenNOVOLOG FLEXPEN 100 UNIT/ML FlexPen Inject 4 Units into the skin 2 (two) times daily after a meal.   . ONETOUCH ULTRA test strip USE AS DIRECTED 3 TIMES DAILY TO CHECK BLOOD SUGAR  . Oxycodone HCl 10 MG TABS Take 1 tablet (10 mg total) by mouth every 8 (eight) hours as needed. (Patient taking differently: Take 10 mg by mouth every 8 (eight) hours as needed. Taking 1 table by mouth in the am and one tablet in the evening)  . predniSONE (DELTASONE) 5 MG tablet Take 5 mg by mouth daily.  . promethazine (PHENERGAN) 25 MG tablet Take 25 mg by mouth at bedtime. Or for sleep  . rosuvastatin (CRESTOR) 20 MG tablet Take 1 tablet (20 mg total) by mouth daily.  . sertraline (ZOLOFT) 100 MG tablet Take 200 mg by mouth daily.  . sertraline (ZOLOFT) 50 MG tablet TAKE 1 TABLET BY MOUTH EVERY DAY IN THE MORNING  .  sodium bicarbonate 650 MG tablet Take 1,300 mg by mouth 2 (two) times daily.  . VENTOLIN HFA 108 (90 Base) MCG/ACT inhaler TAKE 2 PUFFS BY MOUTH EVERY 6 HOURS AS NEEDED FOR WHEEZE OR SHORTNESS OF BREATH (Patient taking differently: Inhale 2 puffs into the lungs every 6 (six) hours as needed for wheezing or shortness of breath. )  . [DISCONTINUED] diltiazem (CARDIZEM CD) 120 MG 24 hr capsule Take 1 capsule (120 mg total) by mouth daily.    Allergies  Allergen Reactions  . Tetracycline Hives    Patient tolerated Doxycycline Dec 2020  . Hydromorphone Hcl Nausea And Vomiting  . Niacin Other (See Comments)    Mouth blisters  . Niaspan [Niacin Er] Other (See Comments)    Mouth blisters  . Sulfa Antibiotics Nausea Only and Other (See Comments)    "Tears up stomach"  . Sulfonamide Derivatives Other (See Comments)    Reaction: per patient "tears her stomach up"  . Codeine Nausea And Vomiting  . Erythromycin Nausea And Vomiting  . Hydromorphone Hcl Nausea And Vomiting  . Morphine And Related Nausea And Vomiting  . Nalbuphine Nausea And Vomiting  . Sulfasalazine Nausea Only and Other (See  Comments)    per patient "tears her stomach up", "Tears up stomach"  . Tape Rash and Other (See Comments)    No "plastic" tape," please    SOCIAL HISTORY/FAMILY HISTORY   Reviewed in Epic:  Pertinent findings: Multiple losses in the family.  She herself has had both COVID-19 vaccine injections..  Sister died late 01-12-23 early June 2020--suffered an MI and then had significant pulmonary fibrosis after CABG.  Her husband [name] died in 2019/02/12  Her son-in-law died 2 or 3 months later -> pulmonary fibrosis  Her niece Maudie Mercury developed Covid in 14-Sep-2019 and died from complications.Alycia Patten -PE, EKG, labs   Wt Readings from Last 3 Encounters:  12/06/19 216 lb 12.8 oz (98.3 kg)  11/16/19 221 lb (100.2 kg)  11/15/19 223 lb (101.2 kg)    Physical Exam: BP (!) 144/73   Pulse 74   Temp (!) 97.1 F (36.2 C)   Resp 20   Ht _0  (1.549 m)   Wt 216 lb 12.8 oz (98.3 kg)   SpO2 95%   BMI 40.96 kg/m  Physical Exam  Constitutional: She is oriented to person, place, and time. She appears well-developed. No distress.  Still morbidly obese, but notably lighter than when I have seen her in the past.  HENT:  Head: Normocephalic and atraumatic.  Cardiovascular: Normal rate, regular rhythm and S2 normal.  No extrasystoles are present. PMI is not displaced (Unable to palpate). Exam reveals distant heart sounds and decreased pulses (Mildly diminished or palpable pedal pulses). Exam reveals no gallop (Cannot exclude soft S4) and no friction rub.  Murmur (1/6 blowing HSM at apex.) heard. High-pitched harsh crescendo-decrescendo midsystolic murmur is present with a grade of 2/6 at the upper right sternal border radiating to the neck. Pulmonary/Chest: Effort normal and breath sounds normal. No respiratory distress. She has no wheezes. She has no rales.  Abdominal: Soft. Bowel sounds are normal. She exhibits no distension. There is abdominal tenderness (Always some tenderness related LAP-BAND.).    Musculoskeletal:        General: Edema (Trivial) present. Normal range of motion.  Neurological: She is alert and oriented to person, place, and time.  Skin: No pallor (Somewhat pale, but has been 2 years since have seen her.).  Psychiatric: She  has a normal mood and affect. Her behavior is normal. Judgment and thought content normal.  Vitals reviewed.   Adult ECG Report Not checked  Recent Labs: September 12, 2018: TC 133, TG 145, HDL 74, LDL 32.  Last A1c 08/06/2019: 6.6.  Hgb was 8.1. Lab Results  Component Value Date   CHOL 111 11/30/2017   HDL 53.90 11/30/2017   LDLCALC 32 11/30/2017   TRIG 129.0 11/30/2017   CHOLHDL 2 11/30/2017   Lab Results  Component Value Date   CREATININE 0.77 08/23/2019   BUN 17 08/23/2019   NA 135 08/23/2019   K 4.5 08/23/2019   CL 103 08/23/2019   CO2 24 08/23/2019   Lab Results  Component Value Date   TSH 3.118 04/20/2018   CBC Latest Ref Rng & Units 08/23/2019 08/13/2019 08/12/2019  WBC 4.0 - 10.5 K/uL 7.8 5.6 5.7  Hemoglobin 12.0 - 15.0 g/dL 8.3 Repeated and verified X2.(L) 8.1(L) 8.1(L)  Hematocrit 36.0 - 46.0 % 26.1(L) 26.3(L) 25.8(L)  Platelets 150.0 - 400.0 K/uL 247.0 271 291    ASSESSMENT/PLAN    Problem List Items Addressed This Visit    Morbid obesity - s/p Lap Band 9/'05 (Chronic)    Weight is doing well right now.  She is actually considerably down and feeling better overall.      S/P CABG (coronary artery bypass graft) x 3 (Chronic)    Stable angiogram by cath in 2019.  There is only a 20% in-stent restenosis in the anastomotic stent of the SVG-D1.  Other coronaries remained stable.       Coronary artery disease involving native coronary artery of native heart with angina pectoris ([place]) (Chronic)    Relatively stable findings on most recent cath.  She really does not have classic anginal symptoms.  She has intermittent chest discomfort symptoms which do not sound anginal in nature.  No longer on aspirin or Plavix which  she is on apixaban. She is on combination of beta-blocker and diltiazem as much her A. fib is for CAD. She remains on rosuvastatin, and her lipids been well controlled.      Dyslipidemia, goal LDL below 70 - Primary (Chronic)    Rosuvastatin actually is listed in her medication list.  Is due for lipids to be checked.  Will check fasting lipid panel prior to next visit.      Relevant Orders   Lipid Profile   Paroxysmal atrial fibrillation ([place]); CHA2DS2VASc score F, HTN, CAD, CVA = 5 (Chronic)    Recently seen in the A. fib clinic.  Maintaining sinus rhythm with Tegretol.  Anticoagulation clinic has converted her from warfarin to apixaban.  This was after she had been converted from Tegretol to Lamictal for seizure medicine.  She is also on combination of metoprolol 50 mg twice daily and diltiazem CD 120 mg daily.      Mild aortic stenosis by prior echocardiogram (Chronic)    Current echo did not suggest any mild bicuspid valve.  Echo just showed mild stenosis with mean gradient 14 mmHg.  Would probably follow-up in December 2023, unless aortic murmur increases.      Stenosis of right carotid artery without infarction (Chronic)    Was due for follow-up carotids in 2019.  We can discuss follow-up Doppler studies at next visit.      Hypertension associated with diabetes ([place]) (Chronic)   PAD (peripheral artery disease) ([facility]) (Chronic)    Not really noticing claudication.  She did have pretty significant femoral arterial  disease.  When I see her in follow-up we would need to check carotid Dopplers.  We will also check lower extremity arterial Dopplers.      Long term (current) use of anticoagulants (Chronic)    Thankfully, no longer on warfarin.  Is now on apixaban and tolerating well.      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 (Chronic)    Very distant history of STEMI in 1994 with PTCA of the RCA followed by CABG.  Has had multiple  evaluations since no significant infarct noted on Myoview and no regional wall motion normality on echo.         COVID-19 Education: The signs and symptoms of COVID-19 were discussed with the patient and how to seek care for testing (follow up with PCP or arrange E-visit).   The importance of social distancing and COVID-19 vaccination was discussed today.  I spent a total of 34mnutes with the patient. >  50% of the time was spent in direct patient consultation.  Additional time spent with chart review  / charting (studies, outside notes, etc): 18 -> complex patient.  Has been close to 2 years since last visit with multiple procedures and studies to review.  Also had several hospitalizations. Total Time: 42 min   Current medicines are reviewed at length with the patient today.  (+/- concerns) n/a  Notice: This dictation was prepared with Dragon dictation along with smaller phrase technology. Any transcriptional errors that result from this process are unintentional and may not be corrected upon review.  Patient Instructions / Medication Changes & Studies & Tests Ordered   Patient Instructions  Medication Instructions:  The current medical regimen is effective;  continue present plan and medications as directed. Please refer to the Current Medication list given to you today. *If you need a refill on your cardiac medications before your next appointment, please call your pharmacy*  Lab Work: [place] you have labs (blood work) drawn today and your tests are completely normal, you will receive your results only by:  [facility](if you have MyChart) OR A paper copy in the mail.  If you have any lab test that is abnormal or we need to change your treatment, we will call you to review the results. You may go to any [facility] that is convenient for you however, we do have a lab in our office that is able to assist you. You DO NOT need an appointment for our lab. The lab is open  8:00am and closes at 4:00pm. Lunch 12:45 - 1:45pm.  Follow-Up: Your next appointment:  6 month(s) Please call our office 2 months in advance to schedule this appointment In Person with DGlenetta Hew MD.  [facility] and your [facility] needs are our priority.  As part of our continuing mission to provide you with exceptional heart care, we have created designated Provider Care Teams.  These Care Teams include your primary Cardiologist (physician) and Advanced Practice Providers (APPs -  Physician Assistants and Nurse Practitioners) who all work together to provide you with the care you need, when you need it.      Studies Ordered:   Orders Placed This Encounter  Procedures  . Lipid Profile     DGlenetta Hew M.D., M.S. Interventional Cardiologist   Pager # [phone] # [phone] [place] [id number]  [facility]

## 2019-12-07 ENCOUNTER — Other Ambulatory Visit: Payer: Self-pay | Admitting: Cardiology

## 2019-12-10 ENCOUNTER — Encounter: Payer: Self-pay | Admitting: Cardiology

## 2019-12-10 NOTE — Assessment & Plan Note (Signed)
Relatively stable findings on most recent cath.  She really does not have classic anginal symptoms.  She has intermittent chest discomfort symptoms which do not sound anginal in nature.  No longer on aspirin or Plavix which she is on apixaban. She is on combination of beta-blocker and diltiazem as much her A. fib is for CAD. She remains on rosuvastatin, and her lipids been well controlled.

## 2019-12-10 NOTE — Assessment & Plan Note (Signed)
Current echo did not suggest any mild bicuspid valve.  Echo just showed mild stenosis with mean gradient 14 mmHg.  Would probably follow-up in December 2023, unless aortic murmur increases.

## 2019-12-10 NOTE — Assessment & Plan Note (Signed)
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  Has had multiple evaluations since no significant infarct noted on Myoview and no regional wall motion normality on echo.

## 2019-12-10 NOTE — Assessment & Plan Note (Signed)
Weight is doing well right now.  She is actually considerably down and feeling better overall.

## 2019-12-10 NOTE — Assessment & Plan Note (Signed)
Was due for follow-up carotids in 2019.  We can discuss follow-up Doppler studies at next visit.

## 2019-12-10 NOTE — Assessment & Plan Note (Addendum)
Stable angiogram by cath in 2019.  There is only a 20% in-stent restenosis in the anastomotic stent of the SVG-D1.  Other coronaries remained stable.

## 2019-12-10 NOTE — Assessment & Plan Note (Signed)
Recently seen in the A. fib clinic.  Maintaining sinus rhythm with Tegretol.  Anticoagulation clinic has converted her from warfarin to apixaban.  This was after she had been converted from Tegretol to Lamictal for seizure medicine.  She is also on combination of metoprolol 50 mg twice daily and diltiazem CD 120 mg daily.

## 2019-12-10 NOTE — Assessment & Plan Note (Signed)
Thankfully, no longer on warfarin.  Is now on apixaban and tolerating well.

## 2019-12-10 NOTE — Assessment & Plan Note (Signed)
Not really noticing claudication.  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  When I see her in follow-up we would need to check carotid Dopplers.  We will also check lower extremity arterial Dopplers.

## 2019-12-10 NOTE — Assessment & Plan Note (Addendum)
Rosuvastatin actually is listed in her medication list.  Is due for lipids to be checked.  Will check fasting lipid panel prior to next visit.

## 2019-12-13 DIAGNOSIS — I5032 Chronic diastolic (congestive) heart failure: Secondary | ICD-10-CM | POA: Diagnosis not present

## 2019-12-13 DIAGNOSIS — K219 Gastro-esophageal reflux disease without esophagitis: Secondary | ICD-10-CM | POA: Diagnosis not present

## 2019-12-13 DIAGNOSIS — E785 Hyperlipidemia, unspecified: Secondary | ICD-10-CM | POA: Diagnosis not present

## 2019-12-13 DIAGNOSIS — I6523 Occlusion and stenosis of bilateral carotid arteries: Secondary | ICD-10-CM | POA: Diagnosis not present

## 2019-12-13 DIAGNOSIS — J449 Chronic obstructive pulmonary disease, unspecified: Secondary | ICD-10-CM | POA: Diagnosis not present

## 2019-12-13 DIAGNOSIS — Z950 Presence of cardiac pacemaker: Secondary | ICD-10-CM | POA: Diagnosis not present

## 2019-12-13 DIAGNOSIS — M069 Rheumatoid arthritis, unspecified: Secondary | ICD-10-CM | POA: Diagnosis not present

## 2019-12-13 DIAGNOSIS — Z8701 Personal history of pneumonia (recurrent): Secondary | ICD-10-CM | POA: Diagnosis not present

## 2019-12-13 DIAGNOSIS — I480 Paroxysmal atrial fibrillation: Secondary | ICD-10-CM | POA: Diagnosis not present

## 2019-12-13 DIAGNOSIS — Z940 Kidney transplant status: Secondary | ICD-10-CM | POA: Diagnosis not present

## 2019-12-13 DIAGNOSIS — M797 Fibromyalgia: Secondary | ICD-10-CM | POA: Diagnosis not present

## 2019-12-13 DIAGNOSIS — I110 Hypertensive heart disease with heart failure: Secondary | ICD-10-CM | POA: Diagnosis not present

## 2019-12-13 DIAGNOSIS — Z794 Long term (current) use of insulin: Secondary | ICD-10-CM | POA: Diagnosis not present

## 2019-12-13 DIAGNOSIS — Z7901 Long term (current) use of anticoagulants: Secondary | ICD-10-CM | POA: Diagnosis not present

## 2019-12-13 DIAGNOSIS — F418 Other specified anxiety disorders: Secondary | ICD-10-CM | POA: Diagnosis not present

## 2019-12-13 DIAGNOSIS — D509 Iron deficiency anemia, unspecified: Secondary | ICD-10-CM | POA: Diagnosis not present

## 2019-12-13 DIAGNOSIS — E1151 Type 2 diabetes mellitus with diabetic peripheral angiopathy without gangrene: Secondary | ICD-10-CM | POA: Diagnosis not present

## 2019-12-13 DIAGNOSIS — G4733 Obstructive sleep apnea (adult) (pediatric): Secondary | ICD-10-CM | POA: Diagnosis not present

## 2019-12-13 DIAGNOSIS — Z9181 History of falling: Secondary | ICD-10-CM | POA: Diagnosis not present

## 2019-12-13 DIAGNOSIS — I083 Combined rheumatic disorders of mitral, aortic and tricuspid valves: Secondary | ICD-10-CM | POA: Diagnosis not present

## 2019-12-13 DIAGNOSIS — E1121 Type 2 diabetes mellitus with diabetic nephropathy: Secondary | ICD-10-CM | POA: Diagnosis not present

## 2019-12-13 DIAGNOSIS — Z6841 Body Mass Index (BMI) 40.0 and over, adult: Secondary | ICD-10-CM | POA: Diagnosis not present

## 2019-12-13 DIAGNOSIS — I0981 Rheumatic heart failure: Secondary | ICD-10-CM | POA: Diagnosis not present

## 2019-12-13 DIAGNOSIS — I2510 Atherosclerotic heart disease of native coronary artery without angina pectoris: Secondary | ICD-10-CM | POA: Diagnosis not present

## 2019-12-15 ENCOUNTER — Telehealth: Payer: Self-pay | Admitting: Internal Medicine

## 2019-12-15 NOTE — Telephone Encounter (Signed)
New message:    Tharon Aquas is calling from [place] to get verbal orders for Evaluation for PT for 1x 1. She states the pt has canceled for today and has canceled a few times before today. Please advise.

## 2019-12-18 NOTE — Telephone Encounter (Signed)
LVM for Tharon Aquas with verbal okay for PT evaluation as requested.

## 2019-12-20 DIAGNOSIS — I110 Hypertensive heart disease with heart failure: Secondary | ICD-10-CM | POA: Diagnosis not present

## 2019-12-20 DIAGNOSIS — I0981 Rheumatic heart failure: Secondary | ICD-10-CM | POA: Diagnosis not present

## 2019-12-20 DIAGNOSIS — I2510 Atherosclerotic heart disease of native coronary artery without angina pectoris: Secondary | ICD-10-CM | POA: Diagnosis not present

## 2019-12-20 DIAGNOSIS — Z23 Encounter for immunization: Secondary | ICD-10-CM | POA: Diagnosis not present

## 2019-12-20 DIAGNOSIS — J449 Chronic obstructive pulmonary disease, unspecified: Secondary | ICD-10-CM | POA: Diagnosis not present

## 2019-12-20 DIAGNOSIS — I083 Combined rheumatic disorders of mitral, aortic and tricuspid valves: Secondary | ICD-10-CM | POA: Diagnosis not present

## 2019-12-20 DIAGNOSIS — I5032 Chronic diastolic (congestive) heart failure: Secondary | ICD-10-CM | POA: Diagnosis not present

## 2019-12-22 DIAGNOSIS — M069 Rheumatoid arthritis, unspecified: Secondary | ICD-10-CM | POA: Diagnosis not present

## 2019-12-22 DIAGNOSIS — I110 Hypertensive heart disease with heart failure: Secondary | ICD-10-CM | POA: Diagnosis not present

## 2019-12-22 DIAGNOSIS — M797 Fibromyalgia: Secondary | ICD-10-CM

## 2019-12-22 DIAGNOSIS — I083 Combined rheumatic disorders of mitral, aortic and tricuspid valves: Secondary | ICD-10-CM | POA: Diagnosis not present

## 2019-12-22 DIAGNOSIS — F418 Other specified anxiety disorders: Secondary | ICD-10-CM

## 2019-12-22 DIAGNOSIS — Z794 Long term (current) use of insulin: Secondary | ICD-10-CM

## 2019-12-22 DIAGNOSIS — I2510 Atherosclerotic heart disease of native coronary artery without angina pectoris: Secondary | ICD-10-CM | POA: Diagnosis not present

## 2019-12-22 DIAGNOSIS — K219 Gastro-esophageal reflux disease without esophagitis: Secondary | ICD-10-CM

## 2019-12-22 DIAGNOSIS — I5032 Chronic diastolic (congestive) heart failure: Secondary | ICD-10-CM | POA: Diagnosis not present

## 2019-12-22 DIAGNOSIS — E1121 Type 2 diabetes mellitus with diabetic nephropathy: Secondary | ICD-10-CM | POA: Diagnosis not present

## 2019-12-22 DIAGNOSIS — E785 Hyperlipidemia, unspecified: Secondary | ICD-10-CM | POA: Diagnosis not present

## 2019-12-22 DIAGNOSIS — J449 Chronic obstructive pulmonary disease, unspecified: Secondary | ICD-10-CM

## 2019-12-22 DIAGNOSIS — E1151 Type 2 diabetes mellitus with diabetic peripheral angiopathy without gangrene: Secondary | ICD-10-CM | POA: Diagnosis not present

## 2019-12-22 DIAGNOSIS — Z8701 Personal history of pneumonia (recurrent): Secondary | ICD-10-CM

## 2019-12-22 DIAGNOSIS — I480 Paroxysmal atrial fibrillation: Secondary | ICD-10-CM | POA: Diagnosis not present

## 2019-12-22 DIAGNOSIS — Z950 Presence of cardiac pacemaker: Secondary | ICD-10-CM

## 2019-12-22 DIAGNOSIS — D509 Iron deficiency anemia, unspecified: Secondary | ICD-10-CM

## 2019-12-22 DIAGNOSIS — Z940 Kidney transplant status: Secondary | ICD-10-CM

## 2019-12-22 DIAGNOSIS — G4733 Obstructive sleep apnea (adult) (pediatric): Secondary | ICD-10-CM

## 2019-12-22 DIAGNOSIS — Z9181 History of falling: Secondary | ICD-10-CM

## 2019-12-22 DIAGNOSIS — Z7901 Long term (current) use of anticoagulants: Secondary | ICD-10-CM

## 2019-12-22 DIAGNOSIS — Z6841 Body Mass Index (BMI) 40.0 and over, adult: Secondary | ICD-10-CM

## 2019-12-22 DIAGNOSIS — I0981 Rheumatic heart failure: Secondary | ICD-10-CM | POA: Diagnosis not present

## 2019-12-23 ENCOUNTER — Other Ambulatory Visit: Payer: Self-pay | Admitting: Registered Nurse

## 2019-12-23 DIAGNOSIS — M797 Fibromyalgia: Secondary | ICD-10-CM

## 2019-12-23 DIAGNOSIS — M47816 Spondylosis without myelopathy or radiculopathy, lumbar region: Secondary | ICD-10-CM

## 2019-12-25 ENCOUNTER — Encounter (HOSPITAL_COMMUNITY): Payer: Self-pay | Admitting: Pharmacy Technician

## 2019-12-25 ENCOUNTER — Other Ambulatory Visit: Payer: Self-pay

## 2019-12-25 ENCOUNTER — Inpatient Hospital Stay (HOSPITAL_COMMUNITY)
Admission: EM | Admit: 2019-12-25 | Discharge: 2019-12-29 | DRG: 292 | Disposition: A | Discharge status: Home Health | Payer: Medicare Other | Attending: Student | Admitting: Student

## 2019-12-25 ENCOUNTER — Emergency Department (HOSPITAL_COMMUNITY): Payer: Medicare Other

## 2019-12-25 ENCOUNTER — Inpatient Hospital Stay (HOSPITAL_COMMUNITY): Payer: Medicare Other

## 2019-12-25 DIAGNOSIS — Z7901 Long term (current) use of anticoagulants: Secondary | ICD-10-CM | POA: Diagnosis not present

## 2019-12-25 DIAGNOSIS — I350 Nonrheumatic aortic (valve) stenosis: Secondary | ICD-10-CM | POA: Diagnosis not present

## 2019-12-25 DIAGNOSIS — Z20822 Contact with and (suspected) exposure to covid-19: Secondary | ICD-10-CM | POA: Diagnosis not present

## 2019-12-25 DIAGNOSIS — J441 Chronic obstructive pulmonary disease with (acute) exacerbation: Secondary | ICD-10-CM | POA: Diagnosis not present

## 2019-12-25 DIAGNOSIS — E785 Hyperlipidemia, unspecified: Secondary | ICD-10-CM | POA: Diagnosis present

## 2019-12-25 DIAGNOSIS — E1151 Type 2 diabetes mellitus with diabetic peripheral angiopathy without gangrene: Secondary | ICD-10-CM | POA: Diagnosis present

## 2019-12-25 DIAGNOSIS — R079 Chest pain, unspecified: Secondary | ICD-10-CM | POA: Diagnosis not present

## 2019-12-25 DIAGNOSIS — Z940 Kidney transplant status: Secondary | ICD-10-CM

## 2019-12-25 DIAGNOSIS — E1142 Type 2 diabetes mellitus with diabetic polyneuropathy: Secondary | ICD-10-CM | POA: Diagnosis present

## 2019-12-25 DIAGNOSIS — I5022 Chronic systolic (congestive) heart failure: Secondary | ICD-10-CM | POA: Diagnosis not present

## 2019-12-25 DIAGNOSIS — J449 Chronic obstructive pulmonary disease, unspecified: Secondary | ICD-10-CM | POA: Diagnosis present

## 2019-12-25 DIAGNOSIS — Z6841 Body Mass Index (BMI) 40.0 and over, adult: Secondary | ICD-10-CM

## 2019-12-25 DIAGNOSIS — D539 Nutritional anemia, unspecified: Secondary | ICD-10-CM | POA: Diagnosis present

## 2019-12-25 DIAGNOSIS — Z9981 Dependence on supplemental oxygen: Secondary | ICD-10-CM

## 2019-12-25 DIAGNOSIS — I4819 Other persistent atrial fibrillation: Secondary | ICD-10-CM | POA: Diagnosis not present

## 2019-12-25 DIAGNOSIS — Z794 Long term (current) use of insulin: Secondary | ICD-10-CM | POA: Diagnosis not present

## 2019-12-25 DIAGNOSIS — Z8673 Personal history of transient ischemic attack (TIA), and cerebral infarction without residual deficits: Secondary | ICD-10-CM

## 2019-12-25 DIAGNOSIS — I25110 Atherosclerotic heart disease of native coronary artery with unstable angina pectoris: Secondary | ICD-10-CM | POA: Diagnosis present

## 2019-12-25 DIAGNOSIS — G4733 Obstructive sleep apnea (adult) (pediatric): Secondary | ICD-10-CM | POA: Diagnosis present

## 2019-12-25 DIAGNOSIS — Z87891 Personal history of nicotine dependence: Secondary | ICD-10-CM | POA: Diagnosis not present

## 2019-12-25 DIAGNOSIS — E1169 Type 2 diabetes mellitus with other specified complication: Secondary | ICD-10-CM | POA: Diagnosis present

## 2019-12-25 DIAGNOSIS — F419 Anxiety disorder, unspecified: Secondary | ICD-10-CM | POA: Diagnosis not present

## 2019-12-25 DIAGNOSIS — M797 Fibromyalgia: Secondary | ICD-10-CM | POA: Diagnosis present

## 2019-12-25 DIAGNOSIS — R4701 Aphasia: Secondary | ICD-10-CM | POA: Diagnosis not present

## 2019-12-25 DIAGNOSIS — G894 Chronic pain syndrome: Secondary | ICD-10-CM | POA: Diagnosis not present

## 2019-12-25 DIAGNOSIS — T380X5A Adverse effect of glucocorticoids and synthetic analogues, initial encounter: Secondary | ICD-10-CM | POA: Diagnosis present

## 2019-12-25 DIAGNOSIS — J9611 Chronic respiratory failure with hypoxia: Secondary | ICD-10-CM | POA: Diagnosis present

## 2019-12-25 DIAGNOSIS — Z7902 Long term (current) use of antithrombotics/antiplatelets: Secondary | ICD-10-CM

## 2019-12-25 DIAGNOSIS — K219 Gastro-esophageal reflux disease without esophagitis: Secondary | ICD-10-CM | POA: Diagnosis present

## 2019-12-25 DIAGNOSIS — Z7951 Long term (current) use of inhaled steroids: Secondary | ICD-10-CM | POA: Diagnosis not present

## 2019-12-25 DIAGNOSIS — G8929 Other chronic pain: Secondary | ICD-10-CM | POA: Diagnosis present

## 2019-12-25 DIAGNOSIS — E1122 Type 2 diabetes mellitus with diabetic chronic kidney disease: Secondary | ICD-10-CM | POA: Diagnosis present

## 2019-12-25 DIAGNOSIS — Z7952 Long term (current) use of systemic steroids: Secondary | ICD-10-CM

## 2019-12-25 DIAGNOSIS — R0602 Shortness of breath: Secondary | ICD-10-CM | POA: Diagnosis not present

## 2019-12-25 DIAGNOSIS — M069 Rheumatoid arthritis, unspecified: Secondary | ICD-10-CM | POA: Diagnosis present

## 2019-12-25 DIAGNOSIS — I252 Old myocardial infarction: Secondary | ICD-10-CM

## 2019-12-25 DIAGNOSIS — E1165 Type 2 diabetes mellitus with hyperglycemia: Secondary | ICD-10-CM | POA: Diagnosis present

## 2019-12-25 DIAGNOSIS — R5381 Other malaise: Secondary | ICD-10-CM | POA: Diagnosis not present

## 2019-12-25 DIAGNOSIS — I5033 Acute on chronic diastolic (congestive) heart failure: Principal | ICD-10-CM

## 2019-12-25 DIAGNOSIS — Z8249 Family history of ischemic heart disease and other diseases of the circulatory system: Secondary | ICD-10-CM | POA: Diagnosis not present

## 2019-12-25 DIAGNOSIS — R531 Weakness: Secondary | ICD-10-CM | POA: Diagnosis not present

## 2019-12-25 DIAGNOSIS — I509 Heart failure, unspecified: Secondary | ICD-10-CM | POA: Insufficient documentation

## 2019-12-25 DIAGNOSIS — K5900 Constipation, unspecified: Secondary | ICD-10-CM | POA: Diagnosis present

## 2019-12-25 DIAGNOSIS — Z951 Presence of aortocoronary bypass graft: Secondary | ICD-10-CM

## 2019-12-25 DIAGNOSIS — Z79899 Other long term (current) drug therapy: Secondary | ICD-10-CM | POA: Diagnosis not present

## 2019-12-25 DIAGNOSIS — Z955 Presence of coronary angioplasty implant and graft: Secondary | ICD-10-CM

## 2019-12-25 DIAGNOSIS — I480 Paroxysmal atrial fibrillation: Secondary | ICD-10-CM | POA: Diagnosis not present

## 2019-12-25 DIAGNOSIS — I152 Hypertension secondary to endocrine disorders: Secondary | ICD-10-CM | POA: Diagnosis present

## 2019-12-25 DIAGNOSIS — I447 Left bundle-branch block, unspecified: Secondary | ICD-10-CM | POA: Diagnosis not present

## 2019-12-25 DIAGNOSIS — I25119 Atherosclerotic heart disease of native coronary artery with unspecified angina pectoris: Secondary | ICD-10-CM | POA: Diagnosis present

## 2019-12-25 DIAGNOSIS — R0902 Hypoxemia: Secondary | ICD-10-CM | POA: Diagnosis not present

## 2019-12-25 DIAGNOSIS — E1140 Type 2 diabetes mellitus with diabetic neuropathy, unspecified: Secondary | ICD-10-CM | POA: Diagnosis not present

## 2019-12-25 DIAGNOSIS — F329 Major depressive disorder, single episode, unspecified: Secondary | ICD-10-CM | POA: Diagnosis present

## 2019-12-25 DIAGNOSIS — I340 Nonrheumatic mitral (valve) insufficiency: Secondary | ICD-10-CM | POA: Diagnosis not present

## 2019-12-25 DIAGNOSIS — I2510 Atherosclerotic heart disease of native coronary artery without angina pectoris: Secondary | ICD-10-CM | POA: Diagnosis present

## 2019-12-25 LAB — CBC WITH DIFFERENTIAL/PLATELET
Abs Immature Granulocytes: 0.04 10*3/uL (ref 0.00–0.07)
Basophils Absolute: 0.1 10*3/uL (ref 0.0–0.1)
Basophils Relative: 1 %
Eosinophils Absolute: 0.2 10*3/uL (ref 0.0–0.5)
Eosinophils Relative: 3 %
HCT: 31.4 % — ABNORMAL LOW (ref 36.0–46.0)
Hemoglobin: 9.8 g/dL — ABNORMAL LOW (ref 12.0–15.0)
Immature Granulocytes: 1 %
Lymphocytes Relative: 9 %
Lymphs Abs: 0.6 10*3/uL — ABNORMAL LOW (ref 0.7–4.0)
MCH: 33 pg (ref 26.0–34.0)
MCHC: 31.2 g/dL (ref 30.0–36.0)
MCV: 105.7 fL — ABNORMAL HIGH (ref 80.0–100.0)
Monocytes Absolute: 0.6 10*3/uL (ref 0.1–1.0)
Monocytes Relative: 9 %
Neutro Abs: 5.6 10*3/uL (ref 1.7–7.7)
Neutrophils Relative %: 77 %
Platelets: 208 10*3/uL (ref 150–400)
RBC: 2.97 MIL/uL — ABNORMAL LOW (ref 3.87–5.11)
RDW: 16.5 % — ABNORMAL HIGH (ref 11.5–15.5)
WBC: 7.1 10*3/uL (ref 4.0–10.5)
nRBC: 0 % (ref 0.0–0.2)

## 2019-12-25 LAB — BASIC METABOLIC PANEL
Anion gap: 11 (ref 5–15)
BUN: 8 mg/dL (ref 8–23)
CO2: 23 mmol/L (ref 22–32)
Calcium: 8.9 mg/dL (ref 8.9–10.3)
Chloride: 104 mmol/L (ref 98–111)
Creatinine, Ser: 0.84 mg/dL (ref 0.44–1.00)
GFR calc Af Amer: >60 mL/min (ref >60)
GFR calc non Af Amer: >60 mL/min (ref >60)
Glucose, Bld: 164 mg/dL — ABNORMAL HIGH (ref 70–99)
Potassium: 4 mmol/L (ref 3.5–5.1)
Sodium: 138 mmol/L (ref 135–145)

## 2019-12-25 LAB — SARS CORONAVIRUS 2 BY RT PCR (HOSPITAL ORDER, PERFORMED IN ~~LOC~~ HOSPITAL LAB): SARS Coronavirus 2: NEGATIVE

## 2019-12-25 LAB — ECHOCARDIOGRAM COMPLETE
Height: 61 in
Weight: 3440 oz

## 2019-12-25 LAB — HEMOGLOBIN A1C
Hgb A1c MFr Bld: 5.6 % (ref 4.8–5.6)
Mean Plasma Glucose: 114.02 mg/dL

## 2019-12-25 LAB — TROPONIN I (HIGH SENSITIVITY)
Troponin I (High Sensitivity): 18 ng/L — ABNORMAL HIGH (ref <18)
Troponin I (High Sensitivity): 15 ng/L (ref <18)

## 2019-12-25 LAB — BRAIN NATRIURETIC PEPTIDE: B Natriuretic Peptide: 422.2 pg/mL — ABNORMAL HIGH (ref 0.0–100.0)

## 2019-12-25 LAB — GLUCOSE, CAPILLARY
Glucose-Capillary: 82 mg/dL (ref 70–99)
Glucose-Capillary: 151 mg/dL — ABNORMAL HIGH (ref 70–99)

## 2019-12-25 LAB — CBG MONITORING, ED: Glucose-Capillary: 111 mg/dL — ABNORMAL HIGH (ref 70–99)

## 2019-12-25 MED ORDER — ACETAMINOPHEN 325 MG PO TABS
650.0000 mg | ORAL_TABLET | ORAL | Status: DC | PRN
  Administered 2019-12-25 – 2019-12-28 (×3): 650 mg via ORAL
  Filled 2019-12-25 (×3): qty 2

## 2019-12-25 MED ORDER — PREDNISONE 5 MG PO TABS
5.0000 mg | ORAL_TABLET | Freq: Every day | ORAL | Status: DC
  Administered 2019-12-26 – 2019-12-29 (×4): 5 mg via ORAL
  Filled 2019-12-25 (×4): qty 1

## 2019-12-25 MED ORDER — BUDESONIDE 0.5 MG/2ML IN SUSP
2.0000 mL | Freq: Two times a day (BID) | RESPIRATORY_TRACT | Status: DC
  Administered 2019-12-25: 0.5 mg via RESPIRATORY_TRACT
  Filled 2019-12-25 (×2): qty 2

## 2019-12-25 MED ORDER — BUDESONIDE 0.5 MG/2ML IN SUSP
2.0000 mL | Freq: Two times a day (BID) | RESPIRATORY_TRACT | Status: DC
  Administered 2019-12-26 – 2019-12-29 (×7): 0.5 mg via RESPIRATORY_TRACT
  Filled 2019-12-25 (×7): qty 2

## 2019-12-25 MED ORDER — ARFORMOTEROL TARTRATE 15 MCG/2ML IN NEBU
15.0000 ug | INHALATION_SOLUTION | Freq: Two times a day (BID) | RESPIRATORY_TRACT | Status: DC
  Administered 2019-12-26 – 2019-12-29 (×7): 15 ug via RESPIRATORY_TRACT
  Filled 2019-12-25 (×7): qty 2

## 2019-12-25 MED ORDER — FUROSEMIDE 10 MG/ML IJ SOLN
40.0000 mg | Freq: Two times a day (BID) | INTRAMUSCULAR | Status: DC
  Administered 2019-12-25 – 2019-12-27 (×6): 40 mg via INTRAVENOUS
  Filled 2019-12-25 (×6): qty 4

## 2019-12-25 MED ORDER — ALBUTEROL SULFATE (2.5 MG/3ML) 0.083% IN NEBU: 2.5000 mg | INHALATION_SOLUTION | RESPIRATORY_TRACT | Status: DC | PRN

## 2019-12-25 MED ORDER — PANTOPRAZOLE SODIUM 40 MG PO TBEC
40.0000 mg | DELAYED_RELEASE_TABLET | Freq: Every day | ORAL | Status: DC
  Administered 2019-12-25 – 2019-12-29 (×5): 40 mg via ORAL
  Filled 2019-12-25 (×5): qty 1

## 2019-12-25 MED ORDER — SERTRALINE HCL 100 MG PO TABS
200.0000 mg | ORAL_TABLET | Freq: Every day | ORAL | Status: DC
  Administered 2019-12-25 – 2019-12-29 (×4): 200 mg via ORAL
  Filled 2019-12-25 (×5): qty 2

## 2019-12-25 MED ORDER — OXYCODONE HCL 5 MG PO TABS
10.0000 mg | ORAL_TABLET | Freq: Three times a day (TID) | ORAL | Status: DC | PRN
  Administered 2019-12-25 – 2019-12-29 (×8): 10 mg via ORAL
  Filled 2019-12-25 (×8): qty 2

## 2019-12-25 MED ORDER — PROMETHAZINE HCL 25 MG PO TABS
25.0000 mg | ORAL_TABLET | Freq: Every day | ORAL | Status: DC
  Administered 2019-12-25: 25 mg via ORAL
  Filled 2019-12-25: qty 1

## 2019-12-25 MED ORDER — INSULIN ASPART 100 UNIT/ML ~~LOC~~ SOLN
0.0000 [IU] | Freq: Three times a day (TID) | SUBCUTANEOUS | Status: DC
  Administered 2019-12-26: 2 [IU] via SUBCUTANEOUS
  Administered 2019-12-26: 5 [IU] via SUBCUTANEOUS
  Administered 2019-12-27 (×2): 1 [IU] via SUBCUTANEOUS
  Administered 2019-12-27: 2 [IU] via SUBCUTANEOUS
  Administered 2019-12-28: 1 [IU] via SUBCUTANEOUS
  Administered 2019-12-28: 2 [IU] via SUBCUTANEOUS
  Administered 2019-12-28: 9 [IU] via SUBCUTANEOUS
  Administered 2019-12-29: 1 [IU] via SUBCUTANEOUS

## 2019-12-25 MED ORDER — DOFETILIDE 250 MCG PO CAPS
250.0000 ug | ORAL_CAPSULE | Freq: Two times a day (BID) | ORAL | Status: DC
  Administered 2019-12-26 – 2019-12-29 (×7): 250 ug via ORAL
  Filled 2019-12-25 (×9): qty 1

## 2019-12-25 MED ORDER — APIXABAN 5 MG PO TABS
5.0000 mg | ORAL_TABLET | Freq: Two times a day (BID) | ORAL | Status: DC
  Administered 2019-12-25 – 2019-12-29 (×8): 5 mg via ORAL
  Filled 2019-12-25 (×10): qty 1

## 2019-12-25 MED ORDER — CALCITRIOL 0.25 MCG PO CAPS
0.2500 ug | ORAL_CAPSULE | ORAL | Status: DC
  Administered 2019-12-25 – 2019-12-28 (×2): 0.25 ug via ORAL
  Filled 2019-12-25 (×2): qty 1

## 2019-12-25 MED ORDER — SODIUM CHLORIDE 0.9% FLUSH: 3.0000 mL | INTRAVENOUS | Status: DC | PRN

## 2019-12-25 MED ORDER — FLUTICASONE PROPIONATE 50 MCG/ACT NA SUSP
2.0000 | Freq: Every day | NASAL | Status: DC
  Filled 2019-12-25: qty 16

## 2019-12-25 MED ORDER — AZATHIOPRINE 50 MG PO TABS
125.0000 mg | ORAL_TABLET | Freq: Every evening | ORAL | Status: DC
  Administered 2019-12-25 – 2019-12-28 (×4): 125 mg via ORAL
  Filled 2019-12-25 (×4): qty 3

## 2019-12-25 MED ORDER — LAMOTRIGINE 25 MG PO TABS
25.0000 mg | ORAL_TABLET | Freq: Every day | ORAL | Status: DC
  Administered 2019-12-25 – 2019-12-28 (×4): 25 mg via ORAL
  Filled 2019-12-25 (×5): qty 1

## 2019-12-25 MED ORDER — DILTIAZEM HCL ER COATED BEADS 120 MG PO CP24
120.0000 mg | ORAL_CAPSULE | Freq: Every day | ORAL | Status: DC
  Administered 2019-12-25 – 2019-12-29 (×5): 120 mg via ORAL
  Filled 2019-12-25 (×6): qty 1

## 2019-12-25 MED ORDER — ONDANSETRON HCL 4 MG/2ML IJ SOLN
4.0000 mg | Freq: Four times a day (QID) | INTRAMUSCULAR | Status: DC | PRN
  Administered 2019-12-26: 4 mg via INTRAVENOUS
  Filled 2019-12-25: qty 2

## 2019-12-25 MED ORDER — ROSUVASTATIN CALCIUM 20 MG PO TABS
20.0000 mg | ORAL_TABLET | Freq: Every day | ORAL | Status: DC
  Administered 2019-12-25 – 2019-12-29 (×5): 20 mg via ORAL
  Filled 2019-12-25 (×4): qty 1
  Filled 2019-12-25: qty 4

## 2019-12-25 MED ORDER — IBUPROFEN 200 MG PO TABS
400.0000 mg | ORAL_TABLET | Freq: Four times a day (QID) | ORAL | Status: AC | PRN
  Administered 2019-12-25: 400 mg via ORAL
  Filled 2019-12-25: qty 2

## 2019-12-25 MED ORDER — CYCLOBENZAPRINE HCL 10 MG PO TABS
10.0000 mg | ORAL_TABLET | Freq: Two times a day (BID) | ORAL | Status: DC | PRN
  Administered 2019-12-26 – 2019-12-29 (×4): 10 mg via ORAL
  Filled 2019-12-25 (×4): qty 1

## 2019-12-25 MED ORDER — SODIUM CHLORIDE 0.9% FLUSH
3.0000 mL | Freq: Two times a day (BID) | INTRAVENOUS | Status: DC
  Administered 2019-12-25 – 2019-12-28 (×6): 3 mL via INTRAVENOUS

## 2019-12-25 MED ORDER — ALBUTEROL SULFATE (2.5 MG/3ML) 0.083% IN NEBU
2.5000 mg | INHALATION_SOLUTION | Freq: Once | RESPIRATORY_TRACT | Status: AC
  Administered 2019-12-25: 2.5 mg via RESPIRATORY_TRACT
  Filled 2019-12-25: qty 3

## 2019-12-25 MED ORDER — METHYLPREDNISOLONE SODIUM SUCC 125 MG IJ SOLR
125.0000 mg | Freq: Once | INTRAMUSCULAR | Status: AC
  Administered 2019-12-25: 125 mg via INTRAVENOUS
  Filled 2019-12-25: qty 2

## 2019-12-25 MED ORDER — SODIUM CHLORIDE 0.9 % IV SOLN: 250.0000 mL | INTRAVENOUS | Status: DC | PRN

## 2019-12-25 MED ORDER — ARFORMOTEROL TARTRATE 15 MCG/2ML IN NEBU
15.0000 ug | INHALATION_SOLUTION | Freq: Two times a day (BID) | RESPIRATORY_TRACT | Status: DC
  Administered 2019-12-25: 15 ug via RESPIRATORY_TRACT
  Filled 2019-12-25 (×2): qty 2

## 2019-12-25 MED ORDER — CARBAMAZEPINE 200 MG PO TABS: 200.0000 mg | ORAL_TABLET | Freq: Two times a day (BID) | ORAL | Status: DC

## 2019-12-25 MED ORDER — INSULIN GLARGINE 100 UNIT/ML ~~LOC~~ SOLN
12.0000 [IU] | Freq: Every day | SUBCUTANEOUS | Status: DC
  Administered 2019-12-25 – 2019-12-28 (×4): 12 [IU] via SUBCUTANEOUS
  Filled 2019-12-25 (×6): qty 0.12

## 2019-12-25 MED ORDER — LORAZEPAM 1 MG PO TABS
1.0000 mg | ORAL_TABLET | Freq: Every day | ORAL | Status: DC | PRN
  Administered 2019-12-25 – 2019-12-28 (×4): 1 mg via ORAL
  Filled 2019-12-25 (×4): qty 1

## 2019-12-25 MED ORDER — MONTELUKAST SODIUM 10 MG PO TABS
10.0000 mg | ORAL_TABLET | Freq: Every day | ORAL | Status: DC
  Administered 2019-12-25 – 2019-12-29 (×5): 10 mg via ORAL
  Filled 2019-12-25 (×5): qty 1

## 2019-12-25 MED ORDER — CLOTRIMAZOLE 10 MG MT TROC
10.0000 mg | Freq: Every day | OROMUCOSAL | Status: DC
  Administered 2019-12-25 – 2019-12-29 (×17): 10 mg via ORAL
  Filled 2019-12-25 (×25): qty 1

## 2019-12-25 MED ORDER — DULOXETINE HCL 60 MG PO CPEP
60.0000 mg | ORAL_CAPSULE | Freq: Every day | ORAL | Status: DC
  Administered 2019-12-25 – 2019-12-29 (×5): 60 mg via ORAL
  Filled 2019-12-25 (×6): qty 1

## 2019-12-25 MED ORDER — ONDANSETRON HCL 4 MG/2ML IJ SOLN
4.0000 mg | Freq: Once | INTRAMUSCULAR | Status: AC
  Administered 2019-12-25: 4 mg via INTRAVENOUS
  Filled 2019-12-25: qty 2

## 2019-12-25 MED ORDER — FLUTICASONE PROPIONATE 50 MCG/ACT NA SUSP
2.0000 | Freq: Every day | NASAL | Status: DC
  Administered 2019-12-25 – 2019-12-28 (×4): 2 via NASAL
  Filled 2019-12-25: qty 16

## 2019-12-25 MED ORDER — METOPROLOL TARTRATE 50 MG PO TABS
50.0000 mg | ORAL_TABLET | Freq: Two times a day (BID) | ORAL | Status: DC
  Administered 2019-12-25 – 2019-12-29 (×8): 50 mg via ORAL
  Filled 2019-12-25 (×8): qty 1

## 2019-12-25 NOTE — ED Notes (Signed)
Got patient a pillow patient is resting with call bell in reach

## 2019-12-25 NOTE — ED Notes (Signed)
Attempted to call report [id number]

## 2019-12-25 NOTE — ED Triage Notes (Signed)
Pt bib ems from home with reports of increasing shob since Friday. Pt was low 80's on 3L on scene. Pt normally does not wear oxygen except for at night. Pt reports oxygen as low as upper 70's. Endorses increased cough, but unable to cough anything up. Reports fever of 100.9 onset Friday as well. Other VSS with ems.

## 2019-12-25 NOTE — H&P (Addendum)
History and Physical    Katherine [name] IOE:[id number] DOB: March 08, 1956 DOA: 12/25/2019  Referring MD/NP/PA: Calvert Cantor, MD PCP: [name], MD  Patient coming from: Home  Chief Complaint: Couldn't breathe  I have personally briefly reviewed patient's old medical records in [place]   HPI: Katherine [name] is a 64 y.o. female with medical history significant of CAD s/p CABG and multiple stents, COPD, DM type II, s/p renal transplant 1991 on chronic steroids, anxiety, depression, myalgia, and chronic pain.  She presents with complaints of having difficulty breathing.  She received her first Covid vaccine 5 days ago.  Since that time she has felt sluggish, had a mildly productive cough, and had   fevers up to 101 F.  She has been wearing her oxygen 24/7 since that time.  Notes associated symptoms of some mild lower extremity edema, intermittent wheezing, and constipation.  Home inhalers have not provided much of any relief.  ED Course: .Upon admission into the emergency department patient noted to be afebrile, respiration 14-26, O2 saturation 92 to 100% on 2 L nasal cannula oxygen, and all other vital signs maintained.  Labs significant for hemoglobin 9.8, troponin 18, and BNP pending.  Chest x-ray noted cardiomegaly with vascular congestion concerning for early edema.  COVID-19 screening negative.  TRH called to admit.  Review of Systems  Constitutional: Positive for chills, fever, malaise/fatigue and weight loss.  HENT: Positive for congestion. Negative for sinus pain.   Eyes: Negative for photophobia and pain.  Respiratory: Positive for cough, sputum production and shortness of breath.   Cardiovascular: Positive for leg swelling. Negative for chest pain.  Gastrointestinal: Positive for constipation. Negative for blood in stool, nausea and vomiting.  Genitourinary: Negative for dysuria and hematuria.  Musculoskeletal: Positive for joint pain and myalgias. Negative for  back pain.  Skin: Negative for itching.  Neurological: Negative for sensory change and loss of consciousness.  Psychiatric/Behavioral: Negative for memory loss and substance abuse.    Past Medical History:  Diagnosis Date  . Anemia   . Anxiety   . Bilateral carotid artery stenosis    Carotid duplex 08/8297: 3-71% LICA, 69-67% RICA, >89% RECA, f/u 1 yr suggested  . CAD (coronary artery disease) of bypass graft 5/01; 3/'02, 8/'03, 10/'04; 1/15   PCI x 5 to SVG-D1   . CAD in native artery 07/1993   3 Vessel Disease (LAD-D1 & RCA) -- CABG (Dx in setting of inferior STEMI-PTCA of RCA)  . CAD S/P percutaneous coronary angioplasty    PCI to SVG-D1 insertion/native D1 x 4 = '01 -(S660 BMS 2.5 x 9 anastomosis- D1); '02 - distal overlap ACS Pixel 2.5 x 8  BMS; '03 distal/native ISR/Thrombosis - Pixel 2.5 x 13; '04 - ISR-  Taxus 2.5 x 20 (covered all);; 1/15 - mid SVG-D1 (50% distal ISR) - Promus P 2.75 x 20 -- 2.8 mm  . COPD mixed type ([place])    Followed by Dr. Lamonte Sakai "pulmonologist said no COPD"  . Depression   . Depression with anxiety   . Diabetes mellitus type 2 in obese ([place])   . Diarrhea    started after cholecystectomy and mass removed from intestine  . Dyslipidemia, goal LDL below 70    08/2012: TC 137, TG 200, HDL 32!, LDL 45; on statin (followed by Dr.Deterding)  . ESRD (end stage renal disease) ([place]) 1991   s/p Cadaveric Renal Transplant [facility] - Dr. Jimmy Footman)   . Family history of adverse reaction to anesthesia  mom's bp dropped during/after anesthesia  . Fibromyalgia   . GERD (gastroesophageal reflux disease)   . Glomerulonephritis, chronic, rapidly progressive 40  . 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 07/1993   Rescue PTCA of RCA -- referred for CABG.  . H/O: GI bleed   . Headache    migraines in the past  . History of CABG x 3 08/1993   Dr. Servando Snare: LIMA-LAD, SVG-bifurcatingD1, SVG-rPDA  . History of kidney stones   . History of  stroke 2012   "right eye stroke- half blind now"  . Hypertension associated with diabetes ([place])   . Mild aortic stenosis by prior echocardiogram 07/2019   Echo:  Mild aortic stenosis (gradients: Mean 14.3 mmHg -peak 24.9 mmHg).  . Morbid obesity ([place])   . MRSA (methicillin resistant staph aureus) culture positive   . OSA (obstructive sleep apnea)    no longer on CPAP or home O2, states she doesn't need now after lap band  . PAD (peripheral artery disease) ([place]) 08/2013   LEA Dopplers to be read by Dr. Fletcher Anon  . PAF (paroxysmal atrial fibrillation) ([place]) 06/2014   Noted on CardioNet Monitor  - --> rhythm control with Tikosyn (Dr. Rayann Heman); converted from warfarin to apixaban for anticoagulation.  . Pneumonia   . Recurrent boils    Bilateral Groin  . Rheumatoid arthritis ([place])    Per Patient Report; associated with OA  . S/p cadaver renal transplant [place]  . Unstable angina ([place]) 5/01; 3/'02, 8/'03, 10/'04; 1/15   x 5 occurences since Inf-Post STEMI in 1994    Past Surgical History:  Procedure Laterality Date  . ABDOMINAL AORTOGRAM N/A 04/21/2018   Procedure: ABDOMINAL AORTOGRAM;  Surgeon: [name], MD;  Location: [place] [facility];  Service: Cardiovascular;  Laterality: N/A;  . CATHETER REMOVAL    . CHOLECYSTECTOMY N/A 10/29/2014   Procedure: LAPAROSCOPIC CHOLECYSTECTOMY WITH INTRAOPERATIVE CHOLANGIOGRAM;  Surgeon: Excell Seltzer, MD;  Location: [facility];  Service: General;  Laterality: N/A;  . CORONARY ANGIOPLASTY  1994   x5  . CORONARY ARTERY BYPASS GRAFT  1995   LIMA-LAD, SVG-RPDA, SVG-D1  . ESOPHAGOGASTRODUODENOSCOPY N/A 10/15/2016   Procedure: ESOPHAGOGASTRODUODENOSCOPY (EGD);  Surgeon: Wilford Corner, MD;  Location: [facility] ENDOSCOPY;  Service: Endoscopy;  Laterality: N/A;  . I & D EXTREMITY Right 01/29/2018   Procedure: IRRIGATION AND DEBRIDEMENT THUMB;  Surgeon: Dayna Barker, MD;  Location: [place];  Service: Plastics;  Laterality: Right;  . INCISE AND DRAIN ABCESS     . KIDNEY TRANSPLANT  1991  . KNEE ARTHROSCOPY WITH LATERAL MENISECTOMY Left 12/03/2017   Procedure: LEFT KNEE ARTHROSCOPY WITH LATERAL MENISECTOMY;  Surgeon: Earlie Server, MD;  Location: [place];  Service: Orthopedics;  Laterality: Left;  . LAPAROSCOPIC GASTRIC BANDING  04/2004; 10/'09, 2/'10   Port Replacement x 2  . LEFT HEART CATH AND CORS/GRAFTS ANGIOGRAPHY N/A 04/21/2018   Procedure: LEFT HEART CATH AND CORS/GRAFTS ANGIOGRAPHY;  Surgeon: [name], MD;  Location: [place] [facility];  Ost-Prox LAD 50% - proxLAD (pre & post D1) 100% CTO. Cx - patent, small OM1 (stable ~ ostial OM1 90%, too small for PCI) & 2 small LPL; Ost-distal RCA 100% CTO.  LIMA-LAD (not injected); SVG-dRCA patent, SVG-D1 - insertion stent ~20% ISR - Severe R CFA disease w/ focal Sub TO  . LEFT HEART CATH AND CORS/GRAFTS ANGIOGRAPHY  5/'01, 3/'02, 8/'03, 10/'04; 1/'15   08/22/2013: LAD & RCA 100%; LIMA-LAD & SVG-rPDA patent; Cx-- OM1  60%, OM2 ostial ~50%; SVG-D1 - 80% mid, 50% distal ISR --PCI  . LEFT HEART CATHETERIZATION WITH CORONARY/GRAFT ANGIOGRAM N/A 08/23/2013   Procedure: LEFT HEART CATHETERIZATION WITH Beatrix Fetters;  Surgeon: Wellington Hampshire, MD;  Location: [place] CATH LAB;  Service: Cardiovascular;  Laterality: N/A;  . Lower Extremity Arterial Dopplers  08/2013   ABI: R 0.96, L 1.04  . MULTIPLE TOOTH EXTRACTIONS  age 66  . NM MYOVIEW LTD  03/2016   EF 62%. LOW RISK. C/W prior MI - no Ischemia. Apical hypokinesis.  [name] Kitchen PERCUTANEOUS CORONARY STENT INTERVENTION (PCI-S)  5/'01, 3/'02, 8/'03, 10/'04;   '01 - S660 BMS 2.5 x 9 - dSVG-D1 into D1; '02- post-stent stenosis - 2.5 x 8 Pixel BMS; '8\03: ISR/Thrombosis into native D1 - AngioJet, 2.5 x 13 Pixel; '04 - ISR 95% - covered stented area with Taxus DES 2.5 mm x 20 (2.88)  . PERCUTANEOUS CORONARY STENT INTERVENTION (PCI-S)  08/23/2013   Procedure: PERCUTANEOUS CORONARY STENT INTERVENTION (PCI-S);  Surgeon: Wellington Hampshire, MD;  Location: [facility] CATH LAB;  Service:  Cardiovascular;;mid SVG-D1 80%; distal stent ~50% ISR; Promus Prermier DES 2.75 mm xc 20 mm (2.8 mm)  . PORT-A-CATH REMOVAL     kidney  . TRANSTHORACIC ECHOCARDIOGRAM  07/2019   EF 55 to 60%.  No LVH.  Paradoxical septal wall motion-post CABG.  GRII DD.  Normal RV size and function.  Mild bilateral atrial dilation.  Moderate MAC.  Trace MR.  Mild aortic stenosis (gradients: Mean 14.3 mmHg -peak 24.9 mmHg).  . TUBAL LIGATION    . wrist fistula repair Left    dialysis for one year     reports that she quit smoking about 17 years ago. Her smoking use included cigarettes. She has a 30.00 pack-year smoking history. She has never used smokeless tobacco. She reports that she does not drink alcohol or use drugs.  Allergies  Allergen Reactions  . Tetracycline Hives    Patient tolerated Doxycycline Dec 2020  . Hydromorphone Hcl Nausea And Vomiting  . Niacin Other (See Comments)    Mouth blisters  . Niaspan [Niacin Er] Other (See Comments)    Mouth blisters  . Sulfa Antibiotics Nausea Only and Other (See Comments)    "Tears up stomach"  . Sulfonamide Derivatives Other (See Comments)    Reaction: per patient "tears her stomach up"  . Codeine Nausea And Vomiting  . Erythromycin Nausea And Vomiting  . Hydromorphone Hcl Nausea And Vomiting  . Morphine And Related Nausea And Vomiting  . Nalbuphine Nausea And Vomiting  . Sulfasalazine Nausea Only and Other (See Comments)    per patient "tears her stomach up", "Tears up stomach"  . Tape Rash and Other (See Comments)    No "plastic" tape," please    Family History  Problem Relation Age of Onset  . Cancer Mother        liver  . Heart disease Father   . Cancer Father        colon  . Arrhythmia Brother        Atrial Fibrillation  . Arrhythmia Paternal Aunt        Atrial Fibrillation    Prior to Admission medications   Medication Sig Start Date End Date Taking? Authorizing Provider  albuterol (PROVENTIL) (2.5 MG/3ML) 0.083% nebulizer  solution Take 3 mLs (2.5 mg total) by nebulization every 6 (six) hours as needed for wheezing or shortness of breath (dx: J45.20). 03/15/19  Yes Parrett, Fonnie Mu, NP  apixaban (ELIQUIS)  5 MG TABS tablet Take 1 tablet (5 mg total) by mouth 2 (two) times daily. 11/28/19  Yes [name], MD  arformoterol (BROVANA) 15 MCG/2ML NEBU Take 2 mLs (15 mcg total) by nebulization 2 (two) times daily. 08/30/19  Yes Collene Gobble, MD  azaTHIOprine (IMURAN) 50 MG tablet Take 125 mg by mouth every evening. Taking 2 & 1/2 tabs (166m) qd   Yes [provider]  budesonide (PULMICORT) 0.5 MG/2ML nebulizer solution Take 2 mLs (0.5 mg total) by nebulization 2 (two) times daily. 08/24/19  Yes JJanith Lima MD  calcitRIOL (ROCALTROL) 0.25 MCG capsule Take 0.25 mcg by mouth every 3 (three) days.    Yes [provider]  clotrimazole (MYCELEX) 10 MG troche Take 1 tablet (10 mg total) by mouth 5 (five) times daily. 11/28/18  Yes Parrett, Tammy S, NP  cyclobenzaprine (FLEXERIL) 10 MG tablet TAKE 1 TABLET BY MOUTH 3 TIMES DAILY AS NEEDED FOR MUSCLE SPASMS Patient taking differently: Take 10 mg by mouth 2 (two) times daily.  10/19/17  Yes SMeredith Staggers MD  diltiazem (CARDIZEM CD) 120 MG 24 hr capsule TAKE 1 CAPSULE BY MOUTH EVERY DAY Patient taking differently: Take 120 mg by mouth daily.  12/07/19  Yes HLeonie Man MD  dofetilide (TIKOSYN) 250 MCG capsule TAKE 1 CAPSULE BY MOUTH TWICE A DAY Patient taking differently: Take 250 mcg by mouth 2 (two) times daily. TAKE 1 CAPSULE BY MOUTH TWICE A DAY 10/27/19  Yes CSherran Needs NP  DULoxetine (CYMBALTA) 30 MG capsule TAKE 2 CAPSULES (60 MG TOTAL) BY MOUTH EVERY MORNING. Patient taking differently: Take 60 mg by mouth daily.  05/08/19  Yes TBayard Hugger NP  ferrous sulfate 325 (65 FE) MG tablet Take 1 tablet (325 mg total) by mouth daily with breakfast. 08/14/19  Yes Hall, Carole N, DO  fluticasone (FLONASE) 50 MCG/ACT nasal spray PLACE 2 SPRAYS  INTO BOTH NOSTRILS 2 TIMES DAILY Patient taking differently: Place 2 sprays into both nostrils 2 (two) times daily.  03/22/19  Yes BCollene Gobble MD  Insulin Glargine (BASAGLAR KWIKPEN) 100 UNIT/ML SOPN Inject 12 Units into the skin daily.   Yes [provider]  lamoTRIgine (LAMICTAL) 25 MG tablet Take 1 tablet (25 mg total) by mouth at bedtime. 12/04/19 12/03/20 Yes SMeredith Staggers MD  LORazepam (ATIVAN) 1 MG tablet Take 1 mg by mouth daily. Takes daily 07/06/19  Yes [provider]  metFORMIN (GLUCOPHAGE) 500 MG tablet Take 500 mg by mouth 2 (two) times daily. 10/03/16  Yes [provider]  metoprolol tartrate (LOPRESSOR) 50 MG tablet Take 1 tablet (50 mg total) by mouth 2 (two) times daily. 11/16/19  Yes CSherran Needs NP  montelukast (SINGULAIR) 10 MG tablet TAKE 1 TABLET BY MOUTH EVERY DAY Patient taking differently: Take 10 mg by mouth daily.  11/20/19  Yes BCollene Gobble MD  nitroGLYCERIN (NITROSTAT) 0.4 MG SL tablet Place 1 tablet (0.4 mg total) under the tongue every 5 (five) minutes as needed for chest pain. 09/11/13  Yes HLeonie Man MD  NOVOLOG FLEXPEN 100 UNIT/ML FlexPen Inject 4 Units into the skin 2 (two) times daily after a meal.  12/13/17  Yes [provider]  omeprazole (PRILOSEC) 20 MG capsule Take 20 mg by mouth daily.   Yes [provider]  Oxycodone HCl 10 MG TABS Take 1 tablet (10 mg total) by mouth every 8 (eight) hours as needed. Patient taking differently: Take 10 mg  by mouth in the morning and at bedtime. Taking 1 table by mouth in the am and one tablet in the evening 11/15/19  Yes Meredith Staggers, MD  predniSONE (DELTASONE) 5 MG tablet Take 5 mg by mouth daily. 06/19/19  Yes [provider]  promethazine (PHENERGAN) 25 MG tablet Take 25 mg by mouth at bedtime. Or for sleep 07/14/19  Yes [provider]  rosuvastatin (CRESTOR) 20 MG tablet Take 1 tablet (20 mg total) by mouth daily. 08/29/19  Yes [name], MD  sertraline (ZOLOFT) 100 MG tablet Take 200 mg by mouth at bedtime.  05/26/19  Yes [provider]  VENTOLIN HFA 108 (90 Base) MCG/ACT inhaler TAKE 2 PUFFS BY MOUTH EVERY 6 HOURS AS NEEDED FOR WHEEZE OR SHORTNESS OF BREATH Patient taking differently: Inhale 2 puffs into the lungs every 6 (six) hours as needed for wheezing or shortness of breath.  03/15/19  Yes Byrum, Rose Fillers, MD  B-D ULTRAFINE III SHORT PEN 31G X 8 MM MISC See admin instructions. 11/06/19   [provider]  guaiFENesin (MUCINEX) 600 MG 12 hr tablet Take 1 tablet (600 mg total) by mouth 2 (two) times daily. Patient not taking: Reported on 12/25/2019 02/02/18   Bonnielee Haff, MD  metoprolol tartrate (LOPRESSOR) 25 MG tablet TAKE 1 TABLET BY MOUTH 2 (TWO) TIMES DAILY. MAY TAKE EXTRA TABLET DAILY FOR BREAKTHROUGH AFIB Patient not taking: Reported on 12/25/2019 11/30/19   Sherran Needs, NP  [facility] ULTRA test strip USE AS DIRECTED 3 TIMES DAILY TO CHECK BLOOD SUGAR 11/04/19   [provider]  sertraline (ZOLOFT) 50 MG tablet TAKE 1 TABLET BY MOUTH EVERY DAY IN THE MORNING Patient not taking: Reported on 12/25/2019 09/19/19   Meredith Staggers, MD    Physical Exam:  Constitutional: Currently in no acute distress and able to follow commands Vitals:   12/25/19 0915 12/25/19 0930 12/25/19 1100 12/25/19 1130  BP: (!) 124/51 (!) 126/51 (!) 119/54 (!) 127/58  Pulse: (!) 57 (!) 57 (!) 59 (!) 58  Resp: 14 (!) 22 (!) 23 20  Temp:      TempSrc:      SpO2: 93% 94% 98% 98%  Weight:      Height:       Eyes: PERRL, lids and conjunctivae normal ENMT: Mucous membranes are moist. Posterior pharynx clear of any exudate or lesions. Neck: normal, supple, no masses, no thyromegaly Respiratory: Decreased overall aeration with no expiratory wheeze appreciated at this time and positive crackles noted in the mid to lower lung fields. Cardiovascular: Regular rate and rhythm.  +1 pitting bilateral lower extremity  edema. 2+ pedal pulses. No carotid bruits.  Abdomen: no tenderness, no masses palpated. No hepatosplenomegaly. Bowel sounds positive.  Musculoskeletal: no clubbing / cyanosis. No joint deformity upper and lower extremities. Good ROM, no contractures. Normal muscle tone.  Skin: no rashes, lesions, ulcers. No induration Neurologic: CN 2-12 grossly intact. Sensation intact, DTR normal. Strength 5/5 in all 4.  Psychiatric: Normal judgment and insight. Alert and oriented x 3. Normal mood.     Labs on Admission: I have personally reviewed following labs and imaging studies  CBC: Recent Labs  Lab 12/25/19 0730  WBC 7.1  NEUTROABS 5.6  HGB 9.8*  HCT 31.4*  MCV 105.7*  PLT 944   Basic Metabolic Panel: Recent Labs  Lab 12/25/19 0730  NA 138  K 4.0  CL 104  CO2 23  GLUCOSE 164*  BUN 8  CREATININE  0.84  CALCIUM 8.9   GFR: Estimated Creatinine Clearance: 74.2 mL/min (by C-G formula based on SCr of 0.84 mg/dL). Liver Function Tests: No results for input(s): AST, ALT, ALKPHOS, BILITOT, PROT, ALBUMIN in the last 168 hours. No results for input(s): LIPASE, AMYLASE in the last 168 hours. No results for input(s): AMMONIA in the last 168 hours. Coagulation Profile: No results for input(s): INR, PROTIME in the last 168 hours. Cardiac Enzymes: No results for input(s): CKTOTAL, CKMB, CKMBINDEX, TROPONINI in the last 168 hours. BNP (last 3 results) No results for input(s): PROBNP in the last 8760 hours. HbA1C: No results for input(s): HGBA1C in the last 72 hours. CBG: No results for input(s): GLUCAP in the last 168 hours. Lipid Profile: No results for input(s): CHOL, HDL, LDLCALC, TRIG, CHOLHDL, LDLDIRECT in the last 72 hours. Thyroid Function Tests: No results for input(s): TSH, T4TOTAL, FREET4, T3FREE, THYROIDAB in the last 72 hours. Anemia Panel: No results for input(s): VITAMINB12, FOLATE, FERRITIN, TIBC, IRON, RETICCTPCT in the last 72 hours. Urine analysis:    Component Value  Date/Time   COLORURINE YELLOW 08/08/2019 1722   APPEARANCEUR CLEAR 08/08/2019 1722   LABSPEC 1.011 08/08/2019 1722   PHURINE 9.0 (H) 08/08/2019 1722   GLUCOSEU NEGATIVE 08/08/2019 1722   HGBUR NEGATIVE 08/08/2019 1722   BILIRUBINUR NEGATIVE 08/08/2019 1722   KETONESUR NEGATIVE 08/08/2019 1722   PROTEINUR NEGATIVE 08/08/2019 1722   UROBILINOGEN 0.2 10/16/2013 0110   NITRITE NEGATIVE 08/08/2019 1722   LEUKOCYTESUR NEGATIVE 08/08/2019 1722   Sepsis Labs: Recent Results (from the past 240 hour(s))  SARS Coronavirus 2 by RT PCR (hospital order, performed in [place] hospital lab) Nasopharyngeal Nasopharyngeal Swab     Status: None   Collection Time: 12/25/19  8:27 AM   Specimen: Nasopharyngeal Swab  Result Value Ref Range Status   SARS Coronavirus 2 NEGATIVE NEGATIVE Final    Comment: (NOTE) SARS-CoV-2 target nucleic acids are NOT DETECTED. The SARS-CoV-2 RNA is generally detectable in upper and lower respiratory specimens during the acute phase of infection. The lowest concentration of SARS-CoV-2 viral copies this assay can detect is 250 copies / mL. A negative result does not preclude SARS-CoV-2 infection and should not be used as the sole basis for treatment or other patient management decisions.  A negative result may occur with improper specimen collection / handling, submission of specimen other than nasopharyngeal swab, presence of viral mutation(s) within the areas targeted by this assay, and inadequate number of viral copies (<250 copies / mL). A negative result must be combined with clinical observations, patient history, and epidemiological information. Fact Sheet for Patients:   StrictlyIdeas.no Fact Sheet for Healthcare Providers: BankingDealers.co.za This test is not yet approved or cleared  by the [place] FDA and has been authorized for detection and/or diagnosis of SARS-CoV-2 by FDA under an Emergency Use  Authorization (EUA).  This EUA will remain in effect (meaning this test can be used) for the duration of the COVID-19 declaration under Section 564(b)(1) of the Act, 21 U.S.C. section 360bbb-3(b)(1), unless the authorization is terminated or revoked sooner. Performed at [facility], [place] [address], [place], [place] [address]      Radiological Exams on Admission: DG Chest Port 1 View  Result Date: 12/25/2019 CLINICAL DATA:  Shortness of breath, chest pain EXAM: PORTABLE CHEST 1 VIEW COMPARISON:  08/08/2019 FINDINGS: Prior CABG. Cardiomegaly. Vascular congestion and interstitial prominence may reflect early interstitial edema. No effusions or acute bony abnormality. IMPRESSION: Cardiomegaly with vascular congestion and  possible early interstitial edema. Electronically Signed   By: Rolm Baptise M.D.   On: 12/25/2019 09:20    EKG: Independently reviewed.  Sinus rhythm at 58 bpm with QTc 468.  Assessment/Plan Congestive heart failure exacerbation: Acute.  Patient presents with complaints of shortness of breath.  Chest x-ray showing cardiomegaly with vascular congestion.  Physical exam reveals some crackles.  BNP elevated at 422.2.  Last EF noted to be around 55 to 60% back in 07/2019.  Patient is followed in the outpatient setting by Dr. Ellyn Hack. -Admit to a telemetry bed -Heart failure orders set  initiated  -Continuous pulse oximetry with nasal cannula oxygen as needed to keep O2 saturations >92% -Strict I&Os and daily weights -Elevate lower extremities -Lasix 40 mg IV bid -Reassess in a.m. and adjust diuresis as needed. -Check echocardiogram -Optimize medical management as able -Cardiology formally consulted, we will follow-up for further recommendation  COPD: On admission patient was noted to be actively wheezing, but appears not to be wheezing at this time.  Patient was given 125 mg of Solu-Medrol IV. -Albuterol nebs as needed -Continue Brovana and budesonide  nebs  CAD -Continue aspirin and statin  Elevated troponin: Acute.  High-sensitivity troponin mildly elevated at 18, but repeat within normal limits.  Suspect secondary to demand. -Continue to monitor  Paroxysmal atrial fibrillation on chronic anticoagulation: Home medications include Tikosyn L. -Continue Eliquis and Tikosyn.  Essential hypertension: Home blood pressure medications include Cardizem 120 mg daily, Tikosyn 250 mcg twice daily, and metoprolol 50 mg twice daily. -Continue home regimen as tolerated  Macrocytic anemia: Hemoglobin 9.8 g/dL on admission, but appears improved from previous records. -Recheck CBC in a.m.  Diabetes mellitus type 2: Patient blood sugars noted to be 164 on admission.  Home medications include Basaglar 12 units daily and NovoLog 4 units twice daily with meals.   -Hypoglycemic protocols -Hold Metformin -Continue Basaglar 12 units daily -CBGs q. before meals with sensitive SSI  Status post renal transplant on chronic immunosuppressive therapy -Continue prednisone and Imuran  Anxiety and depression -Continue Cymbalta and Zoloft  Hyperlipidemia -Continue Crestor  Chronic pain, fibromyalgia -Continue current home pain regimen of oxycodone  GERD -Continue pharmacy substitution for omeprazole  Morbid obesity: BMI 40.62 kg/m.  Lap band placed in 2005.  DVT prophylaxis: Eliquis Code Status: Full Family Communication: No family requested to be updated at this time Disposition Plan: Possible discharge home in 1 to the Consults called: Cardiology Admission status: Inpatient  Norval Morton MD [facility] Pager [phone]   If 7PM-7AM, please contact night-coverage [email or web address] Password [facility]  12/25/2019, 11:56 AM

## 2019-12-25 NOTE — Consult Note (Signed)
   I am seeing Katherine [name] here in the ER.  She is well-known to me.  I just saw her on December 06, 2019.  She has a longstanding history of CAD with CABG then PCI.  She has had several ischemic evaluations since her last PCI in 2015 which have all been nonischemic.  She has had relative normal echocardiograms with exception of mild aortic stenosis.  She does have grade 2 diastolic dysfunction.  In addition to her CAD she has had A. fib RVR and is on Tikosyn.  She has history of renal transplant back in 1991 has been doing relatively well from the standpoint.  She also has a history of morbid obesity status post LAP BAND.  When I saw her in the clinic on April 21 she continued note her occasional chest squeezing symptoms and tingling.  She also notes that her heart rate gets going a little fast in the morning time and in the evening just prior to her dose interval for her beta-blocker.  She however does not noticed any prolonged spells of A. Fib. She had not had any PND, orthopnea or edema..  No real anginal symptoms to speak of.  No medication changes were made at that time.  We did check fasting lipid panel.   Katherine [name] was doing relatively well up until last Wednesday, 12/20/2019 shortly after having her initial Moderna Covid vaccine injection.  Ever since that injection, she is just not felt right.  She has felt weak and achy with low-grade fevers.  She has been having worsening cough and dyspnea with oxygen saturations dropping quite low.  She has had to increase her level of oxygen supplementation.  She was hoping to have been off oxygen by this month.  Unfortunately she is having to use more oxygen now. Interestingly she is not necessarily noting any true orthopnea or worsening edema.  No real PND.  In fact when I saw her in the ER room she is lying relatively flat.  Initial intake evaluation here at [facility] emergency room shows a BNP that is moderately elevated but minimal troponin elevation.   Chest x-ray suggests pulmonary edema, so we cannot exclude acute diastolic heart failure. 2D echo pending.  At this point, the clinical scenario self-limited concerning for possible myocarditis as a reaction to the Covid vaccine.  Would have expected a slightly higher troponin elevation, however the elevated BNP and pulmonary edema on chest x-ray could be as a result.  For now, agree with IV diuresis and awaiting the results of the echo. We will need to follow her renal function closely.  Would continue to evaluate other causes for low-grade fevers flulike illness symptoms.  If no other clear-cut etiology as noted, may consider checking a cardiac MRI to evaluate for signs of myocarditis.   I would continue most of her home cardiac medications including her Tikosyn, Lopressor 50 mg twice daily, apixaban.   Full consult note to follow.  Katherine Hew, MD   Katherine Hew, MD

## 2019-12-25 NOTE — ED Notes (Signed)
Pt SpO2 77% on room air upon arrival to ED. Placed on 4L , currently 98%

## 2019-12-25 NOTE — Progress Notes (Signed)
  Echocardiogram 2D Echocardiogram has been performed.  Katherine [name] 12/25/2019, 1:47 PM

## 2019-12-25 NOTE — Plan of Care (Signed)
Spoke with patient about potential enrollment into the hospital at home program and what we have to offer.  Patient declined enrollment.

## 2019-12-25 NOTE — ED Provider Notes (Signed)
[facility] EMERGENCY DEPARTMENT Provider Note   CSN: [id number] Arrival date & time: 12/25/19  0725     History Chief Complaint  Patient presents with  . Shortness of Breath    Katherine [name] is a 64 y.o. female.  HPI Patient with history of CAD, COPD on home O2 typically only needs it at night. She reports she had her first Covid vaccine last week on Wednesday, the following day she felt a little poorly, but Friday noted increased SOB and a temp of 100.30F. She has had to wear her O2 continuously since then. Has had SpO2 as low as mid 70s at home. She reports some cough, no sputum. She states her inhalers/nebulizers have not been helping as much as they usually do.     Past Medical History:  Diagnosis Date  . Anemia   . Anxiety   . Bilateral carotid artery stenosis    Carotid duplex 10/2438: 1-02% LICA, 72-53% RICA, >66% RECA, f/u 1 yr suggested  . CAD (coronary artery disease) of bypass graft 5/01; 3/'02, 8/'03, 10/'04; 1/15   PCI x 5 to SVG-D1   . CAD in native artery 07/1993   3 Vessel Disease (LAD-D1 & RCA) -- CABG (Dx in setting of inferior STEMI-PTCA of RCA)  . CAD S/P percutaneous coronary angioplasty    PCI to SVG-D1 insertion/native D1 x 4 = '01 -(S660 BMS 2.5 x 9 anastomosis- D1); '02 - distal overlap ACS Pixel 2.5 x 8  BMS; '03 distal/native ISR/Thrombosis - Pixel 2.5 x 13; '04 - ISR-  Taxus 2.5 x 20 (covered all);; 1/15 - mid SVG-D1 (50% distal ISR) - Promus P 2.75 x 20 -- 2.8 mm  . COPD mixed type ([place])    Followed by Dr. Lamonte Sakai "pulmonologist said no COPD"  . Depression   . Depression with anxiety   . Diabetes mellitus type 2 in obese ([place])   . Diarrhea    started after cholecystectomy and mass removed from intestine  . Dyslipidemia, goal LDL below 70    08/2012: TC 137, TG 200, HDL 32!, LDL 45; on statin (followed by Dr.Deterding)  . ESRD (end stage renal disease) ([place]) 1991   s/p Cadaveric Renal Transplant [facility] - Dr. Jimmy Footman)   .  Family history of adverse reaction to anesthesia    mom's bp dropped during/after anesthesia  . Fibromyalgia   . GERD (gastroesophageal reflux disease)   . Glomerulonephritis, chronic, rapidly progressive 87  . 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 07/1993   Rescue PTCA of RCA -- referred for CABG.  . H/O: GI bleed   . Headache    migraines in the past  . History of CABG x 3 08/1993   Dr. Servando Snare: LIMA-LAD, SVG-bifurcatingD1, SVG-rPDA  . History of kidney stones   . History of stroke 2012   "right eye stroke- half blind now"  . Hypertension associated with diabetes ([place])   . Mild aortic stenosis by prior echocardiogram 07/2019   Echo:  Mild aortic stenosis (gradients: Mean 14.3 mmHg -peak 24.9 mmHg).  . Morbid obesity ([place])   . MRSA (methicillin resistant staph aureus) culture positive   . OSA (obstructive sleep apnea)    no longer on CPAP or home O2, states she doesn't need now after lap band  . PAD (peripheral artery disease) ([place]) 08/2013   LEA Dopplers to be read by Dr. Fletcher Anon  . PAF (paroxysmal atrial fibrillation) ([place]) 06/2014   Noted on CardioNet Monitor  - -->  rhythm control with Tikosyn (Dr. Rayann Heman); converted from warfarin to apixaban for anticoagulation.  . Pneumonia   . Recurrent boils    Bilateral Groin  . Rheumatoid arthritis ([place])    Per Patient Report; associated with OA  . S/p cadaver renal transplant [place]  . Unstable angina ([place]) 5/01; 3/'02, 8/'03, 10/'04; 1/15   x 5 occurences since Inf-Post STEMI in 1994    Patient Active Problem List   Diagnosis Date Noted  . Acute exacerbation of CHF (congestive heart failure) ([place]) 12/25/2019  . Chronic bronchitis, mucopurulent ([place]) 08/24/2019  . HCAP (healthcare-associated pneumonia) 08/08/2019  . Current chronic use of systemic steroids 07/23/2019  . OSA (obstructive sleep apnea)   . Severe episode of recurrent major depressive disorder, with psychotic features ([place])  02/16/2019  . Obstructive chronic bronchitis without exacerbation ([place]) 11/03/2018  . Long term (current) use of anticoagulants 04/28/2018  . Renal transplant, status post 01/28/2018  . Primary osteoarthritis of right knee 08/18/2017  . Mild aortic stenosis by prior echocardiogram 01/28/2017  . 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 Fibromyalgia 03/30/2016  . Other spondylosis with radiculopathy, lumbar region 03/30/2016  . Type 2 diabetes mellitus with peripheral neuropathy ([place]) 03/30/2016  . Paroxysmal atrial fibrillation ([place]); CHA2DS2VASc score F, HTN, CAD, CVA = 5 06/28/2014  . Hypertension associated with diabetes ([place])   . PAD (peripheral artery disease) ([place]) 08/17/2013  . Stenosis of right carotid artery without infarction 10/08/2012  . Dyslipidemia, goal LDL below 70 10/08/2012    Class: Diagnosis of  . Mitral annular calcification 10/08/2012  . Healthcare-associated pneumonia 06/12/2012  . Renal transplant disorder 06/12/2012  . Chronic allergic rhinitis 04/29/2011  . Extrinsic asthma 09/09/2007  . GERD 09/09/2007  . COUGH, CHRONIC 09/09/2007  . Morbid obesity - s/p Lap Band 9/'05 05/07/2004    Class: Diagnosis of  . S/P CABG (coronary artery bypass graft) x 3 09/07/1993    Class: History of  . 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 07/1993  . Coronary artery disease involving native coronary artery of native heart with angina pectoris ([place]) 07/1993  . ESRD (end stage renal disease) ([place]) 1991    Past Surgical History:  Procedure Laterality Date  . ABDOMINAL AORTOGRAM N/A 04/21/2018   Procedure: ABDOMINAL AORTOGRAM;  Surgeon: [name], MD;  Location: [facility] CV LAB;  Service: Cardiovascular;  Laterality: N/A;  . CATHETER REMOVAL    . CHOLECYSTECTOMY N/A 10/29/2014   Procedure: LAPAROSCOPIC CHOLECYSTECTOMY WITH INTRAOPERATIVE CHOLANGIOGRAM;  Surgeon: Excell Seltzer, MD;  Location: [facility] ORS;  Service: General;  Laterality: N/A;  .  CORONARY ANGIOPLASTY  1994   x5  . CORONARY ARTERY BYPASS GRAFT  1995   LIMA-LAD, SVG-RPDA, SVG-D1  . ESOPHAGOGASTRODUODENOSCOPY N/A 10/15/2016   Procedure: ESOPHAGOGASTRODUODENOSCOPY (EGD);  Surgeon: Wilford Corner, MD;  Location: [facility] ENDOSCOPY;  Service: Endoscopy;  Laterality: N/A;  . I & D EXTREMITY Right 01/29/2018   Procedure: IRRIGATION AND DEBRIDEMENT THUMB;  Surgeon: Dayna Barker, MD;  Location: [facility];  Service: Plastics;  Laterality: Right;  . INCISE AND DRAIN ABCESS    . KIDNEY TRANSPLANT  1991  . KNEE ARTHROSCOPY WITH LATERAL MENISECTOMY Left 12/03/2017   Procedure: LEFT KNEE ARTHROSCOPY WITH LATERAL MENISECTOMY;  Surgeon: Earlie Server, MD;  Location: [facility];  Service: Orthopedics;  Laterality: Left;  . LAPAROSCOPIC GASTRIC BANDING  04/2004; 10/'09, 2/'10   Port Replacement x 2  . LEFT HEART CATH AND CORS/GRAFTS ANGIOGRAPHY N/A 04/21/2018   Procedure: LEFT HEART CATH  AND CORS/GRAFTS ANGIOGRAPHY;  Surgeon: [name], MD;  Location: [facility] CV LAB;  Ost-Prox LAD 50% - proxLAD (pre & post D1) 100% CTO. Cx - patent, small OM1 (stable ~ ostial OM1 90%, too small for PCI) & 2 small LPL; Ost-distal RCA 100% CTO.  LIMA-LAD (not injected); SVG-dRCA patent, SVG-D1 - insertion stent ~20% ISR - Severe R CFA disease w/ focal Sub TO  . LEFT HEART CATH AND CORS/GRAFTS ANGIOGRAPHY  5/'01, 3/'02, 8/'03, 10/'04; 1/'15   08/22/2013: LAD & RCA 100%; LIMA-LAD & SVG-rPDA patent; Cx-- OM1 60%, OM2 ostial ~50%; SVG-D1 - 80% mid, 50% distal ISR --PCI  . LEFT HEART CATHETERIZATION WITH CORONARY/GRAFT ANGIOGRAM N/A 08/23/2013   Procedure: LEFT HEART CATHETERIZATION WITH Beatrix Fetters;  Surgeon: Wellington Hampshire, MD;  Location: [facility] CATH LAB;  Service: Cardiovascular;  Laterality: N/A;  . Lower Extremity Arterial Dopplers  08/2013   ABI: R 0.96, L 1.04  . MULTIPLE TOOTH EXTRACTIONS  age 31  . NM MYOVIEW LTD  03/2016   EF 62%. LOW RISK. C/W prior MI - no Ischemia. Apical hypokinesis.  [name] Kitchen  PERCUTANEOUS CORONARY STENT INTERVENTION (PCI-S)  5/'01, 3/'02, 8/'03, 10/'04;   '01 - S660 BMS 2.5 x 9 - dSVG-D1 into D1; '02- post-stent stenosis - 2.5 x 8 Pixel BMS; '8\03: ISR/Thrombosis into native D1 - AngioJet, 2.5 x 13 Pixel; '04 - ISR 95% - covered stented area with Taxus DES 2.5 mm x 20 (2.88)  . PERCUTANEOUS CORONARY STENT INTERVENTION (PCI-S)  08/23/2013   Procedure: PERCUTANEOUS CORONARY STENT INTERVENTION (PCI-S);  Surgeon: Wellington Hampshire, MD;  Location: [facility] CATH LAB;  Service: Cardiovascular;;mid SVG-D1 80%; distal stent ~50% ISR; Promus Prermier DES 2.75 mm xc 20 mm (2.8 mm)  . PORT-A-CATH REMOVAL     kidney  . TRANSTHORACIC ECHOCARDIOGRAM  07/2019   EF 55 to 60%.  No LVH.  Paradoxical septal wall motion-post CABG.  GRII DD.  Normal RV size and function.  Mild bilateral atrial dilation.  Moderate MAC.  Trace MR.  Mild aortic stenosis (gradients: Mean 14.3 mmHg -peak 24.9 mmHg).  . TUBAL LIGATION    . wrist fistula repair Left    dialysis for one year     OB History   No obstetric history on file.     Family History  Problem Relation Age of Onset  . Cancer Mother        liver  . Heart disease Father   . Cancer Father        colon  . Arrhythmia Brother        Atrial Fibrillation  . Arrhythmia Paternal Aunt        Atrial Fibrillation    Social History   Tobacco Use  . Smoking status: Former Smoker    Packs/day: 1.00    Years: 30.00    Pack years: 30.00    Types: Cigarettes    Quit date: 08/17/2002    Years since quitting: 17.3  . Smokeless tobacco: Never Used  Substance Use Topics  . Alcohol use: No  . Drug use: No    Home Medications Prior to Admission medications   Medication Sig Start Date End Date Taking? Authorizing Provider  albuterol (PROVENTIL) (2.5 MG/3ML) 0.083% nebulizer solution Take 3 mLs (2.5 mg total) by nebulization every 6 (six) hours as needed for wheezing or shortness of breath (dx: J45.20). 03/15/19  Yes Parrett, Tammy S, NP  apixaban  (ELIQUIS) 5 MG TABS tablet Take 1 tablet (5 mg total)  by mouth 2 (two) times daily. 11/28/19  Yes [name], MD  arformoterol (BROVANA) 15 MCG/2ML NEBU Take 2 mLs (15 mcg total) by nebulization 2 (two) times daily. 08/30/19  Yes Collene Gobble, MD  azaTHIOprine (IMURAN) 50 MG tablet Take 125 mg by mouth every evening. Taking 2 & 1/2 tabs (132m) qd   Yes [provider]  budesonide (PULMICORT) 0.5 MG/2ML nebulizer solution Take 2 mLs (0.5 mg total) by nebulization 2 (two) times daily. 08/24/19  Yes JJanith Lima MD  calcitRIOL (ROCALTROL) 0.25 MCG capsule Take 0.25 mcg by mouth every 3 (three) days.    Yes [provider]  clotrimazole (MYCELEX) 10 MG troche Take 1 tablet (10 mg total) by mouth 5 (five) times daily. 11/28/18  Yes Parrett, Tammy S, NP  cyclobenzaprine (FLEXERIL) 10 MG tablet TAKE 1 TABLET BY MOUTH 3 TIMES DAILY AS NEEDED FOR MUSCLE SPASMS Patient taking differently: Take 10 mg by mouth 2 (two) times daily.  10/19/17  Yes SMeredith Staggers MD  diltiazem (CARDIZEM CD) 120 MG 24 hr capsule TAKE 1 CAPSULE BY MOUTH EVERY DAY Patient taking differently: Take 120 mg by mouth daily.  12/07/19  Yes HLeonie Man MD  dofetilide (TIKOSYN) 250 MCG capsule TAKE 1 CAPSULE BY MOUTH TWICE A DAY Patient taking differently: Take 250 mcg by mouth 2 (two) times daily. TAKE 1 CAPSULE BY MOUTH TWICE A DAY 10/27/19  Yes CSherran Needs NP  DULoxetine (CYMBALTA) 30 MG capsule TAKE 2 CAPSULES (60 MG TOTAL) BY MOUTH EVERY MORNING. Patient taking differently: Take 60 mg by mouth daily.  05/08/19  Yes TBayard Hugger NP  ferrous sulfate 325 (65 FE) MG tablet Take 1 tablet (325 mg total) by mouth daily with breakfast. 08/14/19  Yes Hall, Carole N, DO  fluticasone (FLONASE) 50 MCG/ACT nasal spray PLACE 2 SPRAYS INTO BOTH NOSTRILS 2 TIMES DAILY Patient taking differently: Place 2 sprays into both nostrils 2 (two) times daily.  03/22/19  Yes BCollene Gobble MD  Insulin Glargine (BASAGLAR  KWIKPEN) 100 UNIT/ML SOPN Inject 12 Units into the skin daily.   Yes [provider]  lamoTRIgine (LAMICTAL) 25 MG tablet Take 1 tablet (25 mg total) by mouth at bedtime. 12/04/19 12/03/20 Yes SMeredith Staggers MD  LORazepam (ATIVAN) 1 MG tablet Take 1 mg by mouth daily. Takes daily 07/06/19  Yes [provider]  metFORMIN (GLUCOPHAGE) 500 MG tablet Take 500 mg by mouth 2 (two) times daily. 10/03/16  Yes [provider]  metoprolol tartrate (LOPRESSOR) 50 MG tablet Take 1 tablet (50 mg total) by mouth 2 (two) times daily. 11/16/19  Yes CSherran Needs NP  montelukast (SINGULAIR) 10 MG tablet TAKE 1 TABLET BY MOUTH EVERY DAY Patient taking differently: Take 10 mg by mouth daily.  11/20/19  Yes BCollene Gobble MD  nitroGLYCERIN (NITROSTAT) 0.4 MG SL tablet Place 1 tablet (0.4 mg total) under the tongue every 5 (five) minutes as needed for chest pain. 09/11/13  Yes HLeonie Man MD  NOVOLOG FLEXPEN 100 UNIT/ML FlexPen Inject 4 Units into the skin 2 (two) times daily after a meal.  12/13/17  Yes [provider]  omeprazole (PRILOSEC) 20 MG capsule Take 20 mg by mouth daily.   Yes [provider]  Oxycodone HCl 10 MG TABS Take 1 tablet (10 mg total) by mouth every 8 (eight) hours as needed. Patient taking differently: Take 10 mg by mouth in the morning and at bedtime. Taking 1  table by mouth in the am and one tablet in the evening 11/15/19  Yes Meredith Staggers, MD  predniSONE (DELTASONE) 5 MG tablet Take 5 mg by mouth daily. 06/19/19  Yes [provider]  promethazine (PHENERGAN) 25 MG tablet Take 25 mg by mouth at bedtime. Or for sleep 07/14/19  Yes [provider]  rosuvastatin (CRESTOR) 20 MG tablet Take 1 tablet (20 mg total) by mouth daily. 08/29/19  Yes [name], MD  sertraline (ZOLOFT) 100 MG tablet Take 200 mg by mouth at bedtime.  05/26/19  Yes [provider]  VENTOLIN HFA 108 (90 Base) MCG/ACT inhaler TAKE 2 PUFFS BY  MOUTH EVERY 6 HOURS AS NEEDED FOR WHEEZE OR SHORTNESS OF BREATH Patient taking differently: Inhale 2 puffs into the lungs every 6 (six) hours as needed for wheezing or shortness of breath.  03/15/19  Yes Byrum, Rose Fillers, MD  B-D ULTRAFINE III SHORT PEN 31G X 8 MM MISC See admin instructions. 11/06/19   [provider]  carbamazepine (TEGRETOL) 200 MG tablet TAKE 1 TABLET BY MOUTH TWICE A DAY 12/25/19   Meredith Staggers, MD  guaiFENesin (MUCINEX) 600 MG 12 hr tablet Take 1 tablet (600 mg total) by mouth 2 (two) times daily. Patient not taking: Reported on 12/25/2019 02/02/18   Bonnielee Haff, MD  metoprolol tartrate (LOPRESSOR) 25 MG tablet TAKE 1 TABLET BY MOUTH 2 (TWO) TIMES DAILY. MAY TAKE EXTRA TABLET DAILY FOR BREAKTHROUGH AFIB Patient not taking: Reported on 12/25/2019 11/30/19   Sherran Needs, NP  [facility] ULTRA test strip USE AS DIRECTED 3 TIMES DAILY TO CHECK BLOOD SUGAR 11/04/19   [provider]  sertraline (ZOLOFT) 50 MG tablet TAKE 1 TABLET BY MOUTH EVERY DAY IN THE MORNING Patient not taking: Reported on 12/25/2019 09/19/19   Meredith Staggers, MD    Allergies    Tetracycline, Hydromorphone hcl, Niacin, Niaspan [niacin er], Sulfa antibiotics, Sulfonamide derivatives, Codeine, Erythromycin, Hydromorphone hcl, Morphine and related, Nalbuphine, Sulfasalazine, and Tape  Review of Systems   Review of Systems A comprehensive review of systems was completed and negative except as noted in HPI.   Physical Exam Updated Vital Signs BP (!) 171/76 (BP Location: Right Arm)   Pulse 71   Temp 99.4 F (37.4 C) (Oral)   Resp 18   Ht _0  (1.549 m)   Wt 96.3 kg   SpO2 98%   BMI 40.11 kg/m   Physical Exam Vitals and nursing note reviewed.  Constitutional:      Appearance: Normal appearance.  HENT:     Head: Normocephalic and atraumatic.     Nose: Nose normal.     Mouth/Throat:     Mouth: Mucous membranes are moist.  Eyes:     Extraocular Movements: Extraocular  movements intact.     Conjunctiva/sclera: Conjunctivae normal.  Cardiovascular:     Rate and Rhythm: Normal rate.  Pulmonary:     Effort: Pulmonary effort is normal.     Breath sounds: Examination of the right-lower field reveals wheezing and rales. Wheezing and rales present.  Abdominal:     General: Abdomen is flat.     Palpations: Abdomen is soft.     Tenderness: There is no abdominal tenderness.  Musculoskeletal:        General: No swelling. Normal range of motion.     Cervical back: Neck supple.     Right lower leg: No edema.     Left lower leg: No edema.  Skin:  General: Skin is warm and dry.  Neurological:     General: No focal deficit present.     Mental Status: She is alert.  Psychiatric:        Mood and Affect: Mood normal.     ED Results / Procedures / Treatments   Labs (all labs ordered are listed, but only abnormal results are displayed) Labs Reviewed  BASIC METABOLIC PANEL - Abnormal; Notable for the following components:      Result Value   Glucose, Bld 164 (*)    All other components within normal limits  CBC WITH DIFFERENTIAL/PLATELET - Abnormal; Notable for the following components:   RBC 2.97 (*)    Hemoglobin 9.8 (*)    HCT 31.4 (*)    MCV 105.7 (*)    RDW 16.5 (*)    Lymphs Abs 0.6 (*)    All other components within normal limits  BRAIN NATRIURETIC PEPTIDE - Abnormal; Notable for the following components:   B Natriuretic Peptide 422.2 (*)    All other components within normal limits  CBG MONITORING, ED - Abnormal; Notable for the following components:   Glucose-Capillary 111 (*)    All other components within normal limits  TROPONIN I (HIGH SENSITIVITY) - Abnormal; Notable for the following components:   Troponin I (High Sensitivity) 18 (*)    All other components within normal limits  SARS CORONAVIRUS 2 BY RT PCR (HOSPITAL ORDER, [place] [facility])  HEMOGLOBIN T3S  BASIC METABOLIC PANEL  CBC  TROPONIN I (HIGH  SENSITIVITY)    EKG EKG Interpretation  Date/Time:  Monday Dec 25 2019 07:34:28 EDT Ventricular Rate:  58 PR Interval:    QRS Duration: 89 QT Interval:  476 QTC Calculation: 468 R Axis:   -2 Text Interpretation: Sinus rhythm Abnormal R-wave progression, early transition Nonspecific T abnrm, anterolateral leads Since last tracing Left bundle branch block NO LONGER PRESENT Confirmed by Calvert Cantor [phone]) on 12/25/2019 7:55:08 AM   Radiology DG Chest Port 1 View  Result Date: 12/25/2019 CLINICAL DATA:  Shortness of breath, chest pain EXAM: PORTABLE CHEST 1 VIEW COMPARISON:  08/08/2019 FINDINGS: Prior CABG. Cardiomegaly. Vascular congestion and interstitial prominence may reflect early interstitial edema. No effusions or acute bony abnormality. IMPRESSION: Cardiomegaly with vascular congestion and possible early interstitial edema. Electronically Signed   By: Rolm Baptise M.D.   On: 12/25/2019 09:20   ECHOCARDIOGRAM COMPLETE  Result Date: 12/25/2019    ECHOCARDIOGRAM REPORT   Patient Name:   Katherine [name] Date of Exam: 12/25/2019 Medical Rec #:  [id number]          Height:       61.0 in Accession #:    [id number]         Weight:       215.0 lb Date of Birth:  08/05/1956          BSA:          1.948 m Patient Age:    19 years           BP:           126/55 mmHg Patient Gender: F                  HR:           61 bpm. Exam Location:  Inpatient Procedure: 2D Echo, Cardiac Doppler and Color Doppler Indications:     T36.46 Chronic systolic (congestive) heart failure  History:  Patient has prior history of Echocardiogram examinations, most                  recent 07/24/2019. CAD, Prior CABG, COPD, PAD, Carotid Disease                  and Stroke, Arrythmias:Atrial Fibrillation; Risk                  Factors:Hypertension, Diabetes, Dyslipidemia and Sleep Apnea.                  ESRD.  Sonographer:     Jonelle Sidle Dance Referring Phys:  [id number] [place] Diagnosing Phys: Oswaldo Milian MD IMPRESSIONS  1. Left ventricular ejection fraction, by estimation, is 55 to 60%. The left ventricle has grossly normal systolic function. Technically difficult study, unable to assess for regional wall motion abnormalities. There is moderate left ventricular hypertrophy. Left ventricular diastolic parameters are consistent with Grade III diastolic dysfunction (restrictive). Elevated left atrial pressure.  2. Right ventricle is poorly visualized. Right ventricular systolic function is grossly mildly reduced. The right ventricular size is normal. Tricuspid regurgitation signal is inadequate for assessing PA pressure.  3. Left atrial size was mildly dilated.  4. The mitral valve is abnormal. Moderate mitral annular calcification. Trivial mitral valve regurgitation.  5. The aortic valve was not well visualized. Aortic valve regurgitation is not visualized. Mild to moderate aortic valve stenosis (Vmax 2.7 m/s, MG 15 mmHg, AVA 1.2 cm^2, DI 0.46)  6. Aortic dilatation noted. There is mild dilatation of the ascending aorta measuring 36 mm.  7. The inferior vena cava is dilated in size with >50% respiratory variability, suggesting right atrial pressure of 8 mmHg. FINDINGS  Left Ventricle: Left ventricular ejection fraction, by estimation, is 55 to 60%. The left ventricle has normal function. The left ventricle has no regional wall motion abnormalities. The left ventricular internal cavity size was normal in size. There is  moderate left ventricular hypertrophy. Left ventricular diastolic parameters are consistent with Grade III diastolic dysfunction (restrictive). Elevated left atrial pressure. Right Ventricle: The right ventricular size is normal. Right vetricular wall thickness was not assessed. Right ventricular systolic function is mildly reduced. Tricuspid regurgitation signal is inadequate for assessing PA pressure. Left Atrium: Left atrial size was mildly dilated. Right Atrium: Right atrial size was normal  in size. Pericardium: Trivial pericardial effusion is present. Presence of pericardial fat pad. Mitral Valve: The mitral valve is abnormal. Moderate mitral annular calcification. Trivial mitral valve regurgitation. Tricuspid Valve: The tricuspid valve is normal in structure. Tricuspid valve regurgitation is not demonstrated. Aortic Valve: The aortic valve was not well visualized. Aortic valve regurgitation is not visualized. Mild to moderate aortic stenosis is present. Aortic valve mean gradient measures 14.0 mmHg. Aortic valve peak gradient measures 28.6 mmHg. Aortic valve area, by VTI measures 1.19 cm. Pulmonic Valve: The pulmonic valve was not well visualized. Pulmonic valve regurgitation is not visualized. Aorta: Aortic dilatation noted. There is mild dilatation of the ascending aorta measuring 36 mm. Venous: The inferior vena cava is dilated in size with greater than 50% respiratory variability, suggesting right atrial pressure of 8 mmHg. IAS/Shunts: The interatrial septum was not well visualized.  LEFT VENTRICLE PLAX 2D LVIDd:         3.90 cm  Diastology LVIDs:         2.70 cm  LV e' lateral:   6.96 cm/s LV PW:         1.50 cm  LV E/e' lateral: 24.6 LV IVS:        1.10 cm  LV e' medial:    4.90 cm/s LVOT diam:     1.80 cm  LV E/e' medial:  34.9 LV SV:         74 LV SV Index:   38 LVOT Area:     2.54 cm  RIGHT VENTRICLE            IVC RV Basal diam:  2.30 cm    IVC diam: 2.30 cm RV S prime:     8.38 cm/s TAPSE (M-mode): 1.7 cm LEFT ATRIUM             Index       RIGHT ATRIUM           Index LA diam:        4.30 cm 2.21 cm/m  RA Area:     14.20 cm LA Vol (A2C):   68.2 ml 35.02 ml/m RA Volume:   31.20 ml  16.02 ml/m LA Vol (A4C):   77.4 ml 39.74 ml/m LA Biplane Vol: 73.6 ml 37.79 ml/m  AORTIC VALVE AV Area (Vmax):    1.11 cm AV Area (Vmean):   1.17 cm AV Area (VTI):     1.19 cm AV Vmax:           267.50 cm/s AV Vmean:          174.000 cm/s AV VTI:            0.618 m AV Peak Grad:      28.6 mmHg AV Mean  Grad:      14.0 mmHg LVOT Vmax:         117.00 cm/s LVOT Vmean:        80.300 cm/s LVOT VTI:          0.290 m LVOT/AV VTI ratio: 0.47  AORTA Ao Root diam: 3.00 cm Ao Asc diam:  3.60 cm MITRAL VALVE MV Area (PHT): 2.00 cm     SHUNTS MV Decel Time: 380 msec     Systemic VTI:  0.29 m MV E velocity: 171.00 cm/s  Systemic Diam: 1.80 cm MV A velocity: 60.90 cm/s MV E/A ratio:  2.81 Oswaldo Milian MD Electronically signed by Oswaldo Milian MD Signature Date/Time: 12/25/2019/6:45:36 PM    Final (Updated)     Procedures Procedures (including critical care time)  Medications Ordered in ED Medications  albuterol (PROVENTIL) (2.5 MG/3ML) 0.083% nebulizer solution 2.5 mg (has no administration in time range)  oxyCODONE (Oxy IR/ROXICODONE) immediate release tablet 10 mg (10 mg Oral Given 12/25/19 1354)  predniSONE (DELTASONE) tablet 5 mg (has no administration in time range)  pantoprazole (PROTONIX) EC tablet 40 mg (40 mg Oral Given 12/25/19 1359)  apixaban (ELIQUIS) tablet 5 mg (has no administration in time range)  rosuvastatin (CRESTOR) tablet 20 mg (20 mg Oral Given 12/25/19 1356)  calcitRIOL (ROCALTROL) capsule 0.25 mcg (0.25 mcg Oral Given 12/25/19 1358)  insulin glargine (LANTUS) injection 12 Units (12 Units Subcutaneous Given 12/25/19 1354)  cyclobenzaprine (FLEXERIL) tablet 10 mg (has no administration in time range)  arformoterol (BROVANA) nebulizer solution 15 mcg (has no administration in time range)  budesonide (PULMICORT) nebulizer solution 0.5 mg (has no administration in time range)  fluticasone (FLONASE) 50 MCG/ACT nasal spray 2 spray (has no administration in time range)  montelukast (SINGULAIR) tablet 10 mg (10 mg Oral Given 12/25/19 1357)  promethazine (PHENERGAN) tablet 25 mg (has no administration in time range)  clotrimazole [facility]) troche 10 mg (has no administration in time range)  lamoTRIgine (LAMICTAL) tablet 25 mg (has no administration in time range)  sodium chloride  flush (NS) 0.9 % injection 3 mL (3 mLs Intravenous Given 12/25/19 1400)  sodium chloride flush (NS) 0.9 % injection 3 mL (has no administration in time range)  0.9 %  sodium chloride infusion (has no administration in time range)  acetaminophen (TYLENOL) tablet 650 mg (has no administration in time range)  ondansetron (ZOFRAN) injection 4 mg (has no administration in time range)  furosemide (LASIX) injection 40 mg (40 mg Intravenous Given 12/25/19 1355)  sertraline (ZOLOFT) tablet 200 mg (has no administration in time range)  dofetilide (TIKOSYN) capsule 250 mcg (has no administration in time range)  metoprolol tartrate (LOPRESSOR) tablet 50 mg (has no administration in time range)  diltiazem (CARDIZEM CD) 24 hr capsule 120 mg (120 mg Oral Given 12/25/19 1358)  DULoxetine (CYMBALTA) DR capsule 60 mg (60 mg Oral Given 12/25/19 1359)  LORazepam (ATIVAN) tablet 1 mg (1 mg Oral Given 12/25/19 1413)  insulin aspart (novoLOG) injection 0-9 Units (0 Units Subcutaneous Not Given 12/25/19 1811)  albuterol (PROVENTIL) (2.5 MG/3ML) 0.083% nebulizer solution 2.5 mg (has no administration in time range)  azaTHIOprine (IMURAN) tablet 125 mg (has no administration in time range)  ondansetron (ZOFRAN) injection 4 mg (4 mg Intravenous Given 12/25/19 0923)  methylPREDNISolone sodium succinate (SOLU-MEDROL) 125 mg/2 mL injection 125 mg (125 mg Intravenous Given 12/25/19 1355)    ED Course  I have reviewed the triage vital signs and the nursing notes.  Pertinent labs & imaging results that were available during my care of the patient were reviewed by me and considered in my medical decision making (see chart for details).  Clinical Course as of Dec 25 1855  Mon Dec 25, 2019  0837 Patient with cough, SOB, hypoxia and fever at home after first Covid vaccine. She has COPD and history of PNA. Will check labs, CXR and covid swab. EKG shows LBBB has resolved.    [CS]  1884 WBC normal, anemia is at baseline   [CS]  1016  CXR with mild interstitial edema. Covid is neg. BMP is unremarkable.    [CS]  1033 Discussed with Dr. Shan Levans, with the [facility] program. He will come evaluate the patient for possible home treatment.    [CS]  1660 Patient evaluated by Dr. Shan Levans who offered [facility] service but the patient does not feel well enough to go home and would like to be admitted.    [CS]  6301 SWFUX with Dr. Tamala Julian who will evaluate the patient for admission.    [CS]    Clinical Course User Index [CS] Truddie Hidden, MD   MDM Rules/Calculators/A&P                       Final Clinical Impression(s) / ED Diagnoses Final diagnoses:  SOB (shortness of breath)  COPD exacerbation [facility])    Rx / DC Orders ED Discharge Orders    None       Truddie Hidden, MD 12/25/19 1857

## 2019-12-26 ENCOUNTER — Encounter (HOSPITAL_COMMUNITY): Payer: Self-pay | Admitting: Internal Medicine

## 2019-12-26 DIAGNOSIS — I5033 Acute on chronic diastolic (congestive) heart failure: Secondary | ICD-10-CM | POA: Diagnosis present

## 2019-12-26 LAB — GLUCOSE, CAPILLARY
Glucose-Capillary: 108 mg/dL — ABNORMAL HIGH (ref 70–99)
Glucose-Capillary: 181 mg/dL — ABNORMAL HIGH (ref 70–99)
Glucose-Capillary: 260 mg/dL — ABNORMAL HIGH (ref 70–99)
Glucose-Capillary: 182 mg/dL — ABNORMAL HIGH (ref 70–99)

## 2019-12-26 LAB — RESPIRATORY PANEL BY PCR

## 2019-12-26 LAB — CBC
HCT: 32.6 % — ABNORMAL LOW (ref 36.0–46.0)
Hemoglobin: 10.2 g/dL — ABNORMAL LOW (ref 12.0–15.0)
MCH: 32.6 pg (ref 26.0–34.0)
MCHC: 31.3 g/dL (ref 30.0–36.0)
MCV: 104.2 fL — ABNORMAL HIGH (ref 80.0–100.0)
Platelets: 223 10*3/uL (ref 150–400)
RBC: 3.13 MIL/uL — ABNORMAL LOW (ref 3.87–5.11)
RDW: 16.3 % — ABNORMAL HIGH (ref 11.5–15.5)
WBC: 4.6 10*3/uL (ref 4.0–10.5)
nRBC: 0 % (ref 0.0–0.2)

## 2019-12-26 LAB — C-REACTIVE PROTEIN: CRP: 17.6 mg/dL — ABNORMAL HIGH (ref <1.0)

## 2019-12-26 LAB — BASIC METABOLIC PANEL
Anion gap: 11 (ref 5–15)
BUN: 12 mg/dL (ref 8–23)
CO2: 28 mmol/L (ref 22–32)
Calcium: 9.2 mg/dL (ref 8.9–10.3)
Chloride: 99 mmol/L (ref 98–111)
Creatinine, Ser: 0.94 mg/dL (ref 0.44–1.00)
GFR calc Af Amer: >60 mL/min (ref >60)
GFR calc non Af Amer: >60 mL/min (ref >60)
Glucose, Bld: 112 mg/dL — ABNORMAL HIGH (ref 70–99)
Potassium: 3.7 mmol/L (ref 3.5–5.1)
Sodium: 138 mmol/L (ref 135–145)

## 2019-12-26 LAB — SEDIMENTATION RATE: Sed Rate: 94 mm/hr — ABNORMAL HIGH (ref 0–22)

## 2019-12-26 LAB — PROCALCITONIN: Procalcitonin: <0.1 ng/mL

## 2019-12-26 MED ORDER — PROMETHAZINE HCL 25 MG PO TABS
25.0000 mg | ORAL_TABLET | Freq: Every day | ORAL | Status: DC
  Administered 2019-12-26 – 2019-12-28 (×4): 25 mg via ORAL
  Filled 2019-12-26 (×4): qty 1

## 2019-12-26 MED ORDER — POTASSIUM CHLORIDE CRYS ER 20 MEQ PO TBCR
40.0000 meq | EXTENDED_RELEASE_TABLET | Freq: Once | ORAL | Status: AC
  Administered 2019-12-26: 40 meq via ORAL
  Filled 2019-12-26: qty 2

## 2019-12-26 NOTE — Progress Notes (Addendum)
Progress Note  Patient Name: Katherine [name] Date of Encounter: 12/26/2019  Primary Cardiologist: [name], MD   Subjective   Still feels like can't take a deep breath. Chest pain with deep inspiration.  She says that she can tell when the Tylenol wears off, her whole body aching gets worse.  But usually when she has had Tylenol, she feels okay. However, she does feel better and her breathing is notably improved.   Inpatient Medications    Scheduled Meds: . apixaban  5 mg Oral BID  . arformoterol  15 mcg Nebulization BID  . azaTHIOprine  125 mg Oral QPM  . budesonide  2 mL Nebulization BID  . calcitRIOL  0.25 mcg Oral Q3 days  . clotrimazole  10 mg Oral 5 X Daily  . diltiazem  120 mg Oral Daily  . dofetilide  250 mcg Oral BID  . DULoxetine  60 mg Oral Daily  . fluticasone  2 spray Each Nare Daily  . furosemide  40 mg Intravenous BID  . insulin aspart  0-9 Units Subcutaneous TID WC  . insulin glargine  12 Units Subcutaneous Daily  . lamoTRIgine  25 mg Oral QHS  . metoprolol tartrate  50 mg Oral BID  . montelukast  10 mg Oral Daily  . pantoprazole  40 mg Oral Daily  . predniSONE  5 mg Oral Daily  . promethazine  25 mg Oral QHS  . rosuvastatin  20 mg Oral Daily  . sertraline  200 mg Oral QHS  . sodium chloride flush  3 mL Intravenous Q12H   Continuous Infusions: . sodium chloride     PRN Meds: sodium chloride, acetaminophen, albuterol, cyclobenzaprine, LORazepam, ondansetron (ZOFRAN) IV, oxyCODONE, sodium chloride flush   Vital Signs    Vitals:   12/26/19 0850 12/26/19 0853 12/26/19 0913 12/26/19 1138  BP:   (!) 118/48 (!) 116/45  Pulse:   62 (!) 50  Resp:   16 16  Temp:    98.5 F (36.9 C)  TempSrc:    Oral  SpO2: 97% 100% 93% 94%  Weight:      Height:        Intake/Output Summary (Last 24 hours) at 12/26/2019 1210 Last data filed at 12/26/2019 0510 Gross per 24 hour  Intake 803 ml  Output 200 ml  Net 603 ml   Last 3 Weights 12/26/2019  12/25/2019 12/25/2019  Weight (lbs) 211 lb 11.2 oz 212 lb 4.8 oz 215 lb  Weight (kg) 96.026 kg 96.299 kg 97.523 kg      Telemetry    NSR without significant ventricular ectopy - Personally Reviewed  ECG    NSR without significant ST-T wave changes - Personally Reviewed  Physical Exam   GEN: No acute distress.   Neck: No JVD Cardiac:  distant heart sounds. Normal S1 and S2. Unable assess PMI. Cannot exclude [place] for gallop. 1/6 HSM at [place] and 2/6 SCM at RUSB-neck Respiratory: Clear to auscultation bilaterally with only minimal bibasal crackles.[name] Kitchen GI: Soft, mild epigastric tenderness, non-distended  MS: No edema; No deformity. Neuro:  Nonfocal  Psych: Normal affect   Labs    High Sensitivity Troponin:   Recent Labs  Lab 12/25/19 0730 12/25/19 1401  TROPONINIHS 18* 15      Chemistry Recent Labs  Lab 12/25/19 0730 12/26/19 0606  NA 138 138  K 4.0 3.7  CL 104 99  CO2 23 28  GLUCOSE 164* 112*  BUN 8 12  CREATININE 0.84 0.94  CALCIUM  8.9 9.2  GFRNONAA >60 >60  GFRAA >60 >60  ANIONGAP 11 11     Hematology Recent Labs  Lab 12/25/19 0730 12/26/19 0606  WBC 7.1 4.6  RBC 2.97* 3.13*  HGB 9.8* 10.2*  HCT 31.4* 32.6*  MCV 105.7* 104.2*  MCH 33.0 32.6  MCHC 31.2 31.3  RDW 16.5* 16.3*  PLT 208 223    BNP Recent Labs  Lab 12/25/19 1401  BNP 422.2*     DDimer No results for input(s): DDIMER in the last 168 hours.   Radiology    DG Chest Port 1 View  Result Date: 12/25/2019 CLINICAL DATA:  Shortness of breath, chest pain EXAM: PORTABLE CHEST 1 VIEW COMPARISON:  08/08/2019 FINDINGS: Prior CABG. Cardiomegaly. Vascular congestion and interstitial prominence may reflect early interstitial edema. No effusions or acute bony abnormality. IMPRESSION: Cardiomegaly with vascular congestion and possible early interstitial edema. Electronically Signed   By: Rolm Baptise M.D.   On: 12/25/2019 09:20   ECHOCARDIOGRAM COMPLETE  Result Date: 12/25/2019    ECHOCARDIOGRAM  REPORT   Patient Name:   Katherine [name] Date of Exam: 12/25/2019 Medical Rec #:  [id number]          Height:       61.0 in Accession #:    [id number]         Weight:       215.0 lb Date of Birth:  06/27/1956          BSA:          1.948 m Patient Age:    64 years           BP:           126/55 mmHg Patient Gender: F                  HR:           61 bpm. Exam Location:  Inpatient Procedure: 2D Echo, Cardiac Doppler and Color Doppler Indications:     F68.12 Chronic systolic (congestive) heart failure  History:         Patient has prior history of Echocardiogram examinations, most                  recent 07/24/2019. CAD, Prior CABG, COPD, PAD, Carotid Disease                  and Stroke, Arrythmias:Atrial Fibrillation; Risk                  Factors:Hypertension, Diabetes, Dyslipidemia and Sleep Apnea.                  ESRD.  Sonographer:     Jonelle Sidle Dance Referring Phys:  [id number] [place] Diagnosing Phys: Oswaldo Milian MD IMPRESSIONS  1. Left ventricular ejection fraction, by estimation, is 55 to 60%. The left ventricle has grossly normal systolic function. Technically difficult study, unable to assess for regional wall motion abnormalities. There is moderate left ventricular hypertrophy. Left ventricular diastolic parameters are consistent with Grade III diastolic dysfunction (restrictive). Elevated left atrial pressure.  2. Right ventricle is poorly visualized. Right ventricular systolic function is grossly mildly reduced. The right ventricular size is normal. Tricuspid regurgitation signal is inadequate for assessing PA pressure.  3. Left atrial size was mildly dilated.  4. The mitral valve is abnormal. Moderate mitral annular calcification. Trivial mitral valve regurgitation.  5. The aortic valve was not well visualized. Aortic valve regurgitation is not visualized.  Mild to moderate aortic valve stenosis (Vmax 2.7 m/s, MG 15 mmHg, AVA 1.2 cm^2, DI 0.46)  6. Aortic dilatation noted. There is mild  dilatation of the ascending aorta measuring 36 mm.  7. The inferior vena cava is dilated in size with >50% respiratory variability, suggesting right atrial pressure of 8 mmHg. FINDINGS  Left Ventricle: Left ventricular ejection fraction, by estimation, is 55 to 60%. The left ventricle has normal function. The left ventricle has no regional wall motion abnormalities. The left ventricular internal cavity size was normal in size. There is  moderate left ventricular hypertrophy. Left ventricular diastolic parameters are consistent with Grade III diastolic dysfunction (restrictive). Elevated left atrial pressure. Right Ventricle: The right ventricular size is normal. Right vetricular wall thickness was not assessed. Right ventricular systolic function is mildly reduced. Tricuspid regurgitation signal is inadequate for assessing PA pressure. Left Atrium: Left atrial size was mildly dilated. Right Atrium: Right atrial size was normal in size. Pericardium: Trivial pericardial effusion is present. Presence of pericardial fat pad. Mitral Valve: The mitral valve is abnormal. Moderate mitral annular calcification. Trivial mitral valve regurgitation. Tricuspid Valve: The tricuspid valve is normal in structure. Tricuspid valve regurgitation is not demonstrated. Aortic Valve: The aortic valve was not well visualized. Aortic valve regurgitation is not visualized. Mild to moderate aortic stenosis is present. Aortic valve mean gradient measures 14.0 mmHg. Aortic valve peak gradient measures 28.6 mmHg. Aortic valve area, by VTI measures 1.19 cm. Pulmonic Valve: The pulmonic valve was not well visualized. Pulmonic valve regurgitation is not visualized. Aorta: Aortic dilatation noted. There is mild dilatation of the ascending aorta measuring 36 mm. Venous: The inferior vena cava is dilated in size with greater than 50% respiratory variability, suggesting right atrial pressure of 8 mmHg. IAS/Shunts: The interatrial septum was not well  visualized.  LEFT VENTRICLE PLAX 2D LVIDd:         3.90 cm  Diastology LVIDs:         2.70 cm  LV e' lateral:   6.96 cm/s LV PW:         1.50 cm  LV E/e' lateral: 24.6 LV IVS:        1.10 cm  LV e' medial:    4.90 cm/s LVOT diam:     1.80 cm  LV E/e' medial:  34.9 LV SV:         74 LV SV Index:   38 LVOT Area:     2.54 cm  RIGHT VENTRICLE            IVC RV Basal diam:  2.30 cm    IVC diam: 2.30 cm RV S prime:     8.38 cm/s TAPSE (M-mode): 1.7 cm LEFT ATRIUM             Index       RIGHT ATRIUM           Index LA diam:        4.30 cm 2.21 cm/m  RA Area:     14.20 cm LA Vol (A2C):   68.2 ml 35.02 ml/m RA Volume:   31.20 ml  16.02 ml/m LA Vol (A4C):   77.4 ml 39.74 ml/m LA Biplane Vol: 73.6 ml 37.79 ml/m  AORTIC VALVE AV Area (Vmax):    1.11 cm AV Area (Vmean):   1.17 cm AV Area (VTI):     1.19 cm AV Vmax:           267.50 cm/s AV Vmean:  174.000 cm/s AV VTI:            0.618 m AV Peak Grad:      28.6 mmHg AV Mean Grad:      14.0 mmHg LVOT Vmax:         117.00 cm/s LVOT Vmean:        80.300 cm/s LVOT VTI:          0.290 m LVOT/AV VTI ratio: 0.47  AORTA Ao Root diam: 3.00 cm Ao Asc diam:  3.60 cm MITRAL VALVE MV Area (PHT): 2.00 cm     SHUNTS MV Decel Time: 380 msec     Systemic VTI:  0.29 m MV E velocity: 171.00 cm/s  Systemic Diam: 1.80 cm MV A velocity: 60.90 cm/s MV E/A ratio:  2.81 Oswaldo Milian MD Electronically signed by Oswaldo Milian MD Signature Date/Time: 12/25/2019/6:45:36 PM    Final (Updated)     Cardiac Studies   Echo 12/25/2019 1. Left ventricular ejection fraction, by estimation, is 55 to 60%. The  left ventricle has grossly normal systolic function. Technically difficult  study, unable to assess for regional wall motion abnormalities. There is  moderate left ventricular  hypertrophy. Left ventricular diastolic parameters are consistent with  Grade III diastolic dysfunction (restrictive). Elevated left atrial  pressure.  2. Right ventricle is poorly  visualized. Right ventricular systolic  function is grossly mildly reduced. The right ventricular size is normal.  Tricuspid regurgitation signal is inadequate for assessing PA pressure.  3. Left atrial size was mildly dilated.  4. The mitral valve is abnormal. Moderate mitral annular calcification.  Trivial mitral valve regurgitation.  5. The aortic valve was not well visualized. Aortic valve regurgitation  is not visualized. Mild to moderate aortic valve stenosis (Vmax 2.7 m/s,  MG 15 mmHg, AVA 1.2 cm^2, DI 0.46)  6. Aortic dilatation noted. There is mild dilatation of the ascending  aorta measuring 36 mm.  7. The inferior vena cava is dilated in size with >50% respiratory  variability, suggesting right atrial pressure of 8 mmHg.   Patient Profile     64 y.o. female with PMH of CAD s/p CABG, persistent atrial fibrillation, aortic stenosis, ESRD s/p renal transplant, COPD on home O2, and morbid obesity s/p lap band who presented with increasing dyspnea.   Assessment & Plan    1. Acute on chronic diastolic heart failure -current Echo showed normal EF with grade 3 diastolic dysfunction (previously grade 2)  - symptom occurred shortly after COVID vaccine  - associated with chest pain on inspiration  - cannot rule out possible myocarditis associated with vaccine    - not clear if could be a potential candidate for cardiac MRI with gadolinium vs PYP scan. H/o ESRD s/p renal transplant, Cr 0.94 today  - currently on IV lasix 40mg  BID, however, appears to be close to euvolemic on exam. Weight is down, but I/O is actually +600 ml instead of negative volume. Not confident if all of her symptom can be explained by volume excess.   2. CAD s/p CABG: chest pain very atypical, worse with inspiration  3. Persistent atrial fibrillation: maintaining sinus rhythm on tikosyn, no recurrence of afib on telemetry. Continue Eliquis, diltiazem and metoprolol  4. Aortic stenosis: mild to moderate AS  -stable on echo.  Not likely related to current episode.  5. ESRD s/p renal transplant: renal function normal.  6. COPD on home O2      For questions or updates, please contact [place] Please  consult [email or web address] for contact info under        Signed, Almyra Deforest, [place]  12/26/2019, 12:10 PM  \  ATTENDING ATTESTATION  I have seen, examined and evaluated the patient this PM along with Almyra Deforest, PA.  After reviewing all the available data and chart, we discussed the patients laboratory, study & physical findings as well as symptoms in detail. I agree with his findings, examination as well as impression recommendations as per our discussion.    Attending adjustments noted in italics.   Katherine [name] actually is feeling much better.  I am a little concerned about her diastolic function being worse.  This all seems to be stent around her Covid vaccine.  I do think is not unreasonable to check a cardiac MRI just to ensure there is no evidence of myocarditis causing this.  The question will be what is the appropriate treatment if she were to have a vaccine related myocarditis.  Would need to research the proper treatment which could very likely include pulsed steroids -> would need to confirm before giving her steroids that would increase her volume level..  She could probably use maybe 1 more dose of IV Lasix and then convert to oral low-dose.  Thankfully, she is maintaining sinus rhythm on Toprol, diltiazem and Tikosyn.  No bleeding on Eliquis.  We will continue to follow, hopefully she will stabilize and be ready for discharge soon.  Would like to see her oxygen level improving.    [name], M.D., M.S. Interventional Cardiologist   Pager # [phone] Phone # [phone] [address] [place], [place] [address]

## 2019-12-26 NOTE — Plan of Care (Signed)
Education Documentation Educate on current/new medications and food/drug/allergic interactions, taught by Lanier Ensign, RN at 12/25/2019 11:47 PM. Learner: Patient Readiness: Acceptance Method: Explanation Response: Katherine [name] Understanding Comment: tikosyn and ekg education  Explain information regarding tests, treatments and procedures, taught by Lanier Ensign, RN at 12/25/2019 11:47 PM. Learner: Patient Readiness: Acceptance Method: Explanation Response: Katherine [name] Understanding Comment: tikosyn and ekg education  Education Comments No comments found.

## 2019-12-26 NOTE — Consult Note (Signed)
Cardiology Consultation:   Patient ID: Katherine [name] MRN: [id number]; DOB: [birth year]  Admit date: 12/25/2019 Date of Consult: 12/26/2019  Primary Care Provider: [name], MD Primary Cardiologist: [name], MD  Primary Electrophysiologist:  None    Patient Profile:   Katherine [name] is a 64 y.o. female with a hx of longstanding CAD-CABG and PCI, persistent A. fib (rhythm control with Tikosyn, along with metoprolol), grade 2 diastolic function on previous echo with mild to moderate aortic stenosis, ESRD status post renal transplant, relatively recently diagnosed pulmonary disease/COPD on home oxygen (mostly at night appearing) and morbid obesity history of LAP BAND who is being seen today for the evaluation of ACUTE HEART FAILURE at the request of Dr. Fuller Plan ([facility]). -->  Symptoms occurring shortly after receiving first Covid vaccine  Katherine Rump Sheltonis a former patient of Dr. Terance Ice with a history of CAD-CABG followed by PCI as well as PAD/carotid disease and PAF.  CARDIAC HISTORY:   1994-->Inferior-posterior STEMI->3V CAD: LAD-D1 bifurcation & RCA->emergent PTCA of infarct related artery (RCA) -->  ? eventually went for for CABGx3: LIMA-LAD, SVG-D1, SVG-RPDA.  Multiple Caths: 2001, 2002, 2003 than 2004 all with lesions in the SVG-D1. Initial stent followed by's are followed by stent thrombosis..  Cath-PCIfor UA was in January 2015: Native LAD and RCA 100% occluded. LIMA-LAD and SVG-RPDA patent. LCx -OM1 60% & ost OM 2 50% with normal OM 3. SVG-D1 had 80% mid and 50% distal ISR -->1- PCI with a Promus Premier DES 2.35m x 20 mm.  Myoview August 20120f preop: EF 60%. Small anteroapical infarct but no ischemia. Mild apical hypokinesis. EF 62%. LOW RISK  Cath 04/2018 - stable findings; RCA & LAD-TO, Stable small OM1 90%. SVG-D1 ~20% distal insertion Stent ISR (otw patent), Patent SVG-dRCA (did not see LIMA). R CFA  subtotal stenosis.  ? This was for recurrent Afib RVR --> had Angina-type pain with Afib  EP consult - 9/23 - Tikosyn load.;   CHA2DS2Vasc Score (HTN, DM, F, CAD, CVA = 6) =>Warfarin. -> converted to Eliquis -> once her antiepileptic medication was changed from Tegretol to Lamictal  Echo 07/2019:CHA2DS2VAScEF 55 to 60%.  No LVH.  Paradoxical septal wall motion-post CABG.  GRII DD.  Normal RV size and function.  Mild bilateral atrial dilation.  Moderate MAC.  Trace MR.  Mild aortic stenosis (gradients: Mean 14.3 mmHg -peak 24.9 mmHg).  Chief Complaint:   Cannot breathe, hypoxia  History of Present Illness:   Katherine [name] just seen in clinic for follow-up doing fairly well.  She noted her occasional squeezing in her chest that comes and goes but this is her baseline.  Not really associate with exertion. She was very happy to be on Eliquis having switched from warfarin.  She is now on Lamictal and not Tegretol.  She had not noticed any suggestion of recurrence of A. fib.  Just her baseline flutters.  What she feels is that her heart rate goes fast when she first wakes up in the morning -> prior to take her a.m. dose of metoprolol, and then in the evening when is due for her p.m. dose.  She has not had to take any additional as needed doses.  She remains chronically disabled and debilitated with her arthritic pains but is trying to stay more active.  She has lost quite a bit of weight which has helped.  She noted baseline exertional dyspnea fatigue.  But no other significant symptoms.  She asked  me about getting the COVID-19 vaccine and I suggested that she discuss this with her transplant physicians.  Today, Liddy presents to the emergency room with worsening dyspnea and hypoxia with persistent cough myalgias and low-grade fevers.  She had a temperature of just about 101 at home.  All of the stemmed shortly after she got her first injection Covid vaccine this past Wednesday.  2 days later  she started having symptoms and that lasted all weekend long.  She has noted drops in her oxygen saturation into the 70s and has been using her inhalers more frequently.  She has noted a little bit of orthopnea, but not too much beyond baseline.  She has not had any symptoms of chest pain or pressure other than her normal symptoms of occasional squeezing.  She has occasional episodes of flutter as noted above, but that is no change nothing to suggest recurrence of A. fib.  She has had coughing spells and is developed some pretty significant chest discomfort along her chest wall that is worse with deep inspiration but not necessarily with exertion.  She has not really been doing much over the last couple days.  She has basal been feeling sluggish tired and malaise.  Maybe some mild edema.  Has noted more wheezing and coughing.  Has not really had much benefit from her inhalers.  In the emergency room she was noted to be afebrile with decent oxygen saturations of 99% on 2 L of oxygen.  Chest x-ray does show some likely for signs of pulmonary edema, and BNP level is mildly elevated.  Troponin levels are trivial.   Past Medical History:  Diagnosis Date  . Anemia   . Anxiety   . Bilateral carotid artery stenosis    Carotid duplex 04/3902: 0-09% LICA, 23-30% RICA, >07% RECA, f/u 1 yr suggested  . CAD (coronary artery disease) of bypass graft 5/01; 3/'02, 8/'03, 10/'04; 1/15   PCI x 5 to SVG-D1   . CAD in native artery 07/1993   3 Vessel Disease (LAD-D1 & RCA) -- CABG (Dx in setting of inferior STEMI-PTCA of RCA)  . CAD S/P percutaneous coronary angioplasty    PCI to SVG-D1 insertion/native D1 x 4 = '01 -(S660 BMS 2.5 x 9 anastomosis- D1); '02 - distal overlap ACS Pixel 2.5 x 8  BMS; '03 distal/native ISR/Thrombosis - Pixel 2.5 x 13; '04 - ISR-  Taxus 2.5 x 20 (covered all);; 1/15 - mid SVG-D1 (50% distal ISR) - Promus P 2.75 x 20 -- 2.8 mm  . COPD mixed type ([place])    Followed by Dr. Lamonte Sakai "pulmonologist  said no COPD"  . Depression   . Depression with anxiety   . Diabetes mellitus type 2 in obese ([place])   . Diarrhea    started after cholecystectomy and mass removed from intestine  . Dyslipidemia, goal LDL below 70    08/2012: TC 137, TG 200, HDL 32!, LDL 45; on statin (followed by Dr.Deterding)  . ESRD (end stage renal disease) ([place]) 1991   s/p Cadaveric Renal Transplant [facility] - Dr. Jimmy Footman)   . Family history of adverse reaction to anesthesia    mom's bp dropped during/after anesthesia  . Fibromyalgia   . GERD (gastroesophageal reflux disease)   . Glomerulonephritis, chronic, rapidly progressive 52  . 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 07/1993   Rescue PTCA of RCA -- referred for CABG.  . H/O: GI bleed   . Headache    migraines in the  past  . History of CABG x 3 08/1993   Dr. Servando Snare: LIMA-LAD, SVG-bifurcatingD1, SVG-rPDA  . History of kidney stones   . History of stroke 2012   "right eye stroke- half blind now"  . Hypertension associated with diabetes ([place])   . Mild aortic stenosis by prior echocardiogram 07/2019   Echo:  Mild aortic stenosis (gradients: Mean 14.3 mmHg -peak 24.9 mmHg).  . Morbid obesity ([place])   . MRSA (methicillin resistant staph aureus) culture positive   . OSA (obstructive sleep apnea)    no longer on CPAP or home O2, states she doesn't need now after lap band  . PAD (peripheral artery disease) ([place]) 08/2013   LEA Dopplers to be read by Dr. Fletcher Anon  . PAF (paroxysmal atrial fibrillation) ([place]) 06/2014   Noted on CardioNet Monitor  - --> rhythm control with Tikosyn (Dr. Rayann Heman); converted from warfarin to apixaban for anticoagulation.  . Pneumonia   . Recurrent boils    Bilateral Groin  . Rheumatoid arthritis ([place])    Per Patient Report; associated with OA  . S/p cadaver renal transplant [place]  . Unstable angina ([place]) 5/01; 3/'02, 8/'03, 10/'04; 1/15   x 5 occurences since Inf-Post STEMI in 1994    Past  Surgical History:  Procedure Laterality Date  . ABDOMINAL AORTOGRAM N/A 04/21/2018   Procedure: ABDOMINAL AORTOGRAM;  Surgeon: [name], MD;  Location: [place] CV LAB;  Service: Cardiovascular;  Laterality: N/A;  . CATHETER REMOVAL    . CHOLECYSTECTOMY N/A 10/29/2014   Procedure: LAPAROSCOPIC CHOLECYSTECTOMY WITH INTRAOPERATIVE CHOLANGIOGRAM;  Surgeon: Excell Seltzer, MD;  Location: [facility] ORS;  Service: General;  Laterality: N/A;  . CORONARY ANGIOPLASTY  1994   x5  . CORONARY ARTERY BYPASS GRAFT  1995   LIMA-LAD, SVG-RPDA, SVG-D1  . ESOPHAGOGASTRODUODENOSCOPY N/A 10/15/2016   Procedure: ESOPHAGOGASTRODUODENOSCOPY (EGD);  Surgeon: Wilford Corner, MD;  [facility] ENDOSCOPY;  Service: Endoscopy;  Laterality: N/A;  . I & D EXTREMITY Right 01/29/2018   Procedure: IRRIGATION AND DEBRIDEMENT THUMB;  Surgeon: Dayna Barker, MD;  Location: [place];  Service: Plastics;  Laterality: Right;  . INCISE AND DRAIN ABCESS    . KIDNEY TRANSPLANT  1991  . KNEE ARTHROSCOPY WITH LATERAL MENISECTOMY Left 12/03/2017   Procedure: LEFT KNEE ARTHROSCOPY WITH LATERAL MENISECTOMY;  Surgeon: Earlie Server, MD;  Location: [place];  Service: Orthopedics;  Laterality: Left;  . LAPAROSCOPIC GASTRIC BANDING  04/2004; 10/'09, 2/'10   Port Replacement x 2  . LEFT HEART CATH AND CORS/GRAFTS ANGIOGRAPHY N/A 04/21/2018   Procedure: LEFT HEART CATH AND CORS/GRAFTS ANGIOGRAPHY;  Surgeon: [name], MD;  Location: [place] CV LAB;  Ost-Prox LAD 50% - proxLAD (pre & post D1) 100% CTO. Cx - patent, small OM1 (stable ~ ostial OM1 90%, too small for PCI) & 2 small LPL; Ost-distal RCA 100% CTO.  LIMA-LAD (not injected); SVG-dRCA patent, SVG-D1 - insertion stent ~20% ISR - Severe R CFA disease w/ focal Sub TO  . LEFT HEART CATH AND CORS/GRAFTS ANGIOGRAPHY  5/'01, 3/'02, 8/'03, 10/'04; 1/'15   08/22/2013: LAD & RCA 100%; LIMA-LAD & SVG-rPDA patent; Cx-- OM1 60%, OM2 ostial ~50%; SVG-D1 - 80% mid, 50% distal ISR --PCI  . LEFT  HEART CATHETERIZATION WITH CORONARY/GRAFT ANGIOGRAM N/A 08/23/2013   Procedure: LEFT HEART CATHETERIZATION WITH Beatrix Fetters;  Surgeon: Wellington Hampshire, MD;  Location: [place] CATH LAB;  Service: Cardiovascular;  Laterality: N/A;  . Lower Extremity Arterial Dopplers  08/2013   ABI: R  0.96, L 1.04  . MULTIPLE TOOTH EXTRACTIONS  age 47  . NM MYOVIEW LTD  03/2016   EF 62%. LOW RISK. C/W prior MI - no Ischemia. Apical hypokinesis.  [name] Kitchen PERCUTANEOUS CORONARY STENT INTERVENTION (PCI-S)  5/'01, 3/'02, 8/'03, 10/'04;   '01 - S660 BMS 2.5 x 9 - dSVG-D1 into D1; '02- post-stent stenosis - 2.5 x 8 Pixel BMS; '8\03: ISR/Thrombosis into native D1 - AngioJet, 2.5 x 13 Pixel; '04 - ISR 95% - covered stented area with Taxus DES 2.5 mm x 20 (2.88)  . PERCUTANEOUS CORONARY STENT INTERVENTION (PCI-S)  08/23/2013   Procedure: PERCUTANEOUS CORONARY STENT INTERVENTION (PCI-S);  Surgeon: Wellington Hampshire, MD;  Location: [facility] CATH LAB;  Service: Cardiovascular;;mid SVG-D1 80%; distal stent ~50% ISR; Promus Prermier DES 2.75 mm xc 20 mm (2.8 mm)  . PORT-A-CATH REMOVAL     kidney  . TRANSTHORACIC ECHOCARDIOGRAM  07/2019   EF 55 to 60%.  No LVH.  Paradoxical septal wall motion-post CABG.  GRII DD.  Normal RV size and function.  Mild bilateral atrial dilation.  Moderate MAC.  Trace MR.  Mild aortic stenosis (gradients: Mean 14.3 mmHg -peak 24.9 mmHg).  . TUBAL LIGATION    . wrist fistula repair Left    dialysis for one year     Home Medications:  Prior to Admission medications   Medication Sig Start Date End Date Taking? Authorizing Provider  albuterol (PROVENTIL) (2.5 MG/3ML) 0.083% nebulizer solution Take 3 mLs (2.5 mg total) by nebulization every 6 (six) hours as needed for wheezing or shortness of breath (dx: J45.20). 03/15/19  Yes Parrett, Tammy S, NP  apixaban (ELIQUIS) 5 MG TABS tablet Take 1 tablet (5 mg total) by mouth 2 (two) times daily. 11/28/19  Yes [name], MD  arformoterol (BROVANA) 15 MCG/2ML NEBU  Take 2 mLs (15 mcg total) by nebulization 2 (two) times daily. 08/30/19  Yes Collene Gobble, MD  azaTHIOprine (IMURAN) 50 MG tablet Take 125 mg by mouth every evening. Taking 2 & 1/2 tabs (12m) qd   Yes [provider]  budesonide (PULMICORT) 0.5 MG/2ML nebulizer solution Take 2 mLs (0.5 mg total) by nebulization 2 (two) times daily. 08/24/19  Yes JJanith Lima MD  calcitRIOL (ROCALTROL) 0.25 MCG capsule Take 0.25 mcg by mouth every 3 (three) days.    Yes [provider]  clotrimazole (MYCELEX) 10 MG troche Take 1 tablet (10 mg total) by mouth 5 (five) times daily. 11/28/18  Yes Parrett, Tammy S, NP  cyclobenzaprine (FLEXERIL) 10 MG tablet TAKE 1 TABLET BY MOUTH 3 TIMES DAILY AS NEEDED FOR MUSCLE SPASMS Patient taking differently: Take 10 mg by mouth 2 (two) times daily.  10/19/17  Yes SMeredith Staggers MD  diltiazem (CARDIZEM CD) 120 MG 24 hr capsule TAKE 1 CAPSULE BY MOUTH EVERY DAY Patient taking differently: Take 120 mg by mouth daily.  12/07/19  Yes HLeonie Man MD  dofetilide (TIKOSYN) 250 MCG capsule TAKE 1 CAPSULE BY MOUTH TWICE A DAY Patient taking differently: Take 250 mcg by mouth 2 (two) times daily. TAKE 1 CAPSULE BY MOUTH TWICE A DAY 10/27/19  Yes CSherran Needs NP  DULoxetine (CYMBALTA) 30 MG capsule TAKE 2 CAPSULES (60 MG TOTAL) BY MOUTH EVERY MORNING. Patient taking differently: Take 60 mg by mouth daily.  05/08/19  Yes TBayard Hugger NP  ferrous sulfate 325 (65 FE) MG tablet Take 1 tablet (325 mg total) by mouth daily with breakfast. 08/14/19  Yes HIrene Pap  N, DO  fluticasone (FLONASE) 50 MCG/ACT nasal spray PLACE 2 SPRAYS INTO BOTH NOSTRILS 2 TIMES DAILY Patient taking differently: Place 2 sprays into both nostrils 2 (two) times daily.  03/22/19  Yes Collene Gobble, MD  Insulin Glargine (BASAGLAR KWIKPEN) 100 UNIT/ML SOPN Inject 12 Units into the skin daily.   Yes [provider]  lamoTRIgine (LAMICTAL) 25 MG tablet Take 1 tablet (25 mg total)  by mouth at bedtime. 12/04/19 12/03/20 Yes Meredith Staggers, MD  LORazepam (ATIVAN) 1 MG tablet Take 1 mg by mouth daily. Takes daily 07/06/19  Yes [provider]  metFORMIN (GLUCOPHAGE) 500 MG tablet Take 500 mg by mouth 2 (two) times daily. 10/03/16  Yes [provider]  metoprolol tartrate (LOPRESSOR) 50 MG tablet Take 1 tablet (50 mg total) by mouth 2 (two) times daily. 11/16/19  Yes Sherran Needs, NP  montelukast (SINGULAIR) 10 MG tablet TAKE 1 TABLET BY MOUTH EVERY DAY Patient taking differently: Take 10 mg by mouth daily.  11/20/19  Yes Collene Gobble, MD  nitroGLYCERIN (NITROSTAT) 0.4 MG SL tablet Place 1 tablet (0.4 mg total) under the tongue every 5 (five) minutes as needed for chest pain. 09/11/13  Yes [name], MD  NOVOLOG FLEXPEN 100 UNIT/ML FlexPen Inject 4 Units into the skin 2 (two) times daily after a meal.  12/13/17  Yes [provider]  omeprazole (PRILOSEC) 20 MG capsule Take 20 mg by mouth daily.   Yes [provider]  Oxycodone HCl 10 MG TABS Take 1 tablet (10 mg total) by mouth every 8 (eight) hours as needed. Patient taking differently: Take 10 mg by mouth in the morning and at bedtime. Taking 1 table by mouth in the am and one tablet in the evening 11/15/19  Yes Meredith Staggers, MD  predniSONE (DELTASONE) 5 MG tablet Take 5 mg by mouth daily. 06/19/19  Yes [provider]  promethazine (PHENERGAN) 25 MG tablet Take 25 mg by mouth at bedtime. Or for sleep 07/14/19  Yes [provider]  rosuvastatin (CRESTOR) 20 MG tablet Take 1 tablet (20 mg total) by mouth daily. 08/29/19  Yes [name], MD  sertraline (ZOLOFT) 100 MG tablet Take 200 mg by mouth at bedtime.  05/26/19  Yes [provider]  VENTOLIN HFA 108 (90 Base) MCG/ACT inhaler TAKE 2 PUFFS BY MOUTH EVERY 6 HOURS AS NEEDED FOR WHEEZE OR SHORTNESS OF BREATH Patient taking differently: Inhale 2 puffs into the lungs every 6 (six) hours as needed for  wheezing or shortness of breath.  03/15/19  Yes Byrum, Rose Fillers, MD  B-D ULTRAFINE III SHORT PEN 31G X 8 MM MISC See admin instructions. 11/06/19   [provider]  carbamazepine (TEGRETOL) 200 MG tablet TAKE 1 TABLET BY MOUTH TWICE A DAY 12/25/19   Meredith Staggers, MD  guaiFENesin (MUCINEX) 600 MG 12 hr tablet Take 1 tablet (600 mg total) by mouth 2 (two) times daily. Patient not taking: Reported on 12/25/2019 02/02/18   Bonnielee Haff, MD  metoprolol tartrate (LOPRESSOR) 25 MG tablet TAKE 1 TABLET BY MOUTH 2 (TWO) TIMES DAILY. MAY TAKE EXTRA TABLET DAILY FOR BREAKTHROUGH AFIB Patient not taking: Reported on 12/25/2019 11/30/19   Sherran Needs, NP  [facility] ULTRA test strip USE AS DIRECTED 3 TIMES DAILY TO CHECK BLOOD SUGAR 11/04/19   [provider]  sertraline (ZOLOFT) 50 MG tablet TAKE 1 TABLET BY MOUTH EVERY DAY IN THE MORNING Patient not taking: Reported on  12/25/2019 09/19/19   Meredith Staggers, MD    Inpatient Medications: Scheduled Meds: . apixaban  5 mg Oral BID  . arformoterol  15 mcg Nebulization BID  . azaTHIOprine  125 mg Oral QPM  . budesonide  2 mL Nebulization BID  . calcitRIOL  0.25 mcg Oral Q3 days  . clotrimazole  10 mg Oral 5 X Daily  . diltiazem  120 mg Oral Daily  . dofetilide  250 mcg Oral BID  . DULoxetine  60 mg Oral Daily  . fluticasone  2 spray Each Nare Daily  . furosemide  40 mg Intravenous BID  . insulin aspart  0-9 Units Subcutaneous TID WC  . insulin glargine  12 Units Subcutaneous Daily  . lamoTRIgine  25 mg Oral QHS  . metoprolol tartrate  50 mg Oral BID  . montelukast  10 mg Oral Daily  . pantoprazole  40 mg Oral Daily  . predniSONE  5 mg Oral Daily  . promethazine  25 mg Oral QHS  . rosuvastatin  20 mg Oral Daily  . sertraline  200 mg Oral QHS  . sodium chloride flush  3 mL Intravenous Q12H   Continuous Infusions: . sodium chloride     PRN Meds: sodium chloride, acetaminophen, albuterol, cyclobenzaprine, LORazepam,  ondansetron (ZOFRAN) IV, oxyCODONE, sodium chloride flush  Allergies:    Allergies  Allergen Reactions  . Tetracycline Hives    Patient tolerated Doxycycline Dec 2020  . Hydromorphone Hcl Nausea And Vomiting  . Niacin Other (See Comments)    Mouth blisters  . Niaspan [Niacin Er] Other (See Comments)    Mouth blisters  . Sulfa Antibiotics Nausea Only and Other (See Comments)    "Tears up stomach"  . Sulfonamide Derivatives Other (See Comments)    Reaction: per patient "tears her stomach up"  . Codeine Nausea And Vomiting  . Erythromycin Nausea And Vomiting  . Hydromorphone Hcl Nausea And Vomiting  . Morphine And Related Nausea And Vomiting  . Nalbuphine Nausea And Vomiting  . Sulfasalazine Nausea Only and Other (See Comments)    per patient "tears her stomach up", "Tears up stomach"  . Tape Rash and Other (See Comments)    No "plastic" tape," please    Social History:   Social History   Tobacco Use  . Smoking status: Former Smoker    Packs/day: 1.00    Years: 30.00    Pack years: 30.00    Types: Cigarettes    Quit date: 08/17/2002    Years since quitting: 17.3  . Smokeless tobacco: Never Used  Substance Use Topics  . Alcohol use: No  . Drug use: No   Social History   Social History Narrative   She is currently married, and the caregiver of her husband who is recovering from surgery for tongue cancer now diagnosed with lung cancer. Prior to his diagnosis of her husband, she actually had adopted a 8-year-old child who she knows caring for as well. With all the surgeries, they have been quite financially troubled. Thanks the help of her community and church, they have been able to stay "alfoat."     She is a former smoker who quit in 2004 after a 30-pack-year history.   She is active chasing a 50-year-old child, does not do routine exercise. She's been quite depressed with the condition of her husband, and admits to eating comfort herself.   She does not drink alcohol.       Family History:    Family  History  Problem Relation Age of Onset  . Cancer Mother        liver  . Heart disease Father   . Cancer Father        colon  . Arrhythmia Brother        Atrial Fibrillation  . Arrhythmia Paternal Aunt        Atrial Fibrillation     ROS:  Please see the history of present illness.  Review of Systems  Constitutional: Positive for chills, fever and malaise/fatigue.  HENT: Negative for nosebleeds.   Respiratory: Positive for cough and shortness of breath.   Cardiovascular: Positive for chest pain (Along both sides of the sternum). Negative for orthopnea (No more than her baseline), leg swelling and PND.  Gastrointestinal: Positive for abdominal pain (Chronic) and nausea. Negative for blood in stool, diarrhea, melena and vomiting.  Genitourinary: Negative for hematuria.  Musculoskeletal: Positive for myalgias.  Neurological: Positive for dizziness and headaches. Negative for speech change, focal weakness and weakness.  All other systems reviewed and are negative.   Physical Exam/Data:   Vitals:   12/25/19 2023 12/25/19 2140 12/26/19 0019 12/26/19 0021  BP: (!) 145/66  116/79   Pulse: 84  (!) 59   Resp:   18   Temp: 98.7 F (37.1 C) 100.1 F (37.8 C) 98.4 F (36.9 C)   TempSrc: Oral Oral Oral   SpO2: 93%  98%   Weight:    96 kg  Height:        Intake/Output Summary (Last 24 hours) at 12/26/2019 0056 Last data filed at 12/26/2019 0025 Gross per 24 hour  Intake 563 ml  Output 200 ml  Net 363 ml   Last 3 Weights 12/26/2019 12/25/2019 12/25/2019  Weight (lbs) 211 lb 11.2 oz 212 lb 4.8 oz 215 lb  Weight (kg) 96.026 kg 96.299 kg 97.523 kg     Body mass index is 40 kg/m.  General:  Well nourished, morbidly obese woman.  Nontoxic, but in mild distress.  To seems uncomfortable. HEENT: [place]/AT/EOMI. Lymph: no adenopathy Neck: Difficult to assess JVD.  There may be mildly increased JVD but no significant HJR. Endocrine:  No thryomegaly Vascular: No  carotid bruits; FA pulses 2+ bilaterally without bruits-mildly decreased radial and pedal pulses. Cardiac:  normal S1, S2; RRR; 1-2/6 SEM at RUSB.  Difficult to hear softer 1/6 HSM at apex.  Cannot exclude S4. Lungs: Nonlabored.  Mildly diminished basal breath sounds.  No obvious rales or rhonchi. Abd: soft, no hepatomegaly; no HJR.  She always has some mild abdominal tenderness around her surgical site. Ext: Trivial bilateral LE edema Musculoskeletal:  No deformities, BUE and BLE strength normal and equal; she has significant tenderness to palpation along both sides of the sternal border.  This is been quite sore since her coughing spells. Skin: warm and dry  Neuro:  CNs 2-12 intact, no focal abnormalities noted Psych:  Normal affect   EKG: 2 EKGs performed.  Initial EKG: Sinus bradycardia-58 abnormal R wave progression with early transition.  Anterolateral T wave inversions, cannot exclude ischemia.  Previously identified LBBB no longer present  Follow-up EKG: Sinus rhythm-81 bpm with PACs/PVCs with aberrancy.  LBBB present (suggest rate related left bundle branch block)  Telemetry:  Telemetry was personally reviewed and demonstrates: Sinus rhythm.  Relevant CV Studies:Reviewed in past surgical history.  04/2018 - most recent Cardiac Cath (Image below):  Ost-Prox LAD 50% - proxLAD (pre & post D1) 100% CTO. Cx - patent, small OM1 (stable ~  ostial OM1 90%, too small for PCI) & 2 small LPL; Ost-distal RCA 100% CTO.  LIMA-LAD (not injected); SVG-dRCA patent, SVG-D1 - insertion site stent ~20% ISR - Severe R CFA disease with focal subtotal occlusion.     07/2019 - Echo  (with PNA): EF 55 to 60%.  No LVH.  Paradoxical septal wall motion-post CABG.  GRII DD.  Normal RV size and function.  Mild bilateral atrial dilation.  Moderate MAC.  Trace MR.  Mild aortic stenosis (gradients: Mean 14.3 mmHg -peak 24.9 mmHg).   Echo done today pending on initial evaluation.  Laboratory Data:  High  Sensitivity Troponin:   Recent Labs  Lab 12/25/19 0730 12/25/19 1401  TROPONINIHS 18* 15     Chemistry Recent Labs  Lab 12/25/19 0730  NA 138  K 4.0  CL 104  CO2 23  GLUCOSE 164*  BUN 8  CREATININE 0.84  CALCIUM 8.9  GFRNONAA >60  GFRAA >60  ANIONGAP 11    No results for input(s): PROT, ALBUMIN, AST, ALT, ALKPHOS, BILITOT in the last 168 hours. Hematology Recent Labs  Lab 12/25/19 0730  WBC 7.1  RBC 2.97*  HGB 9.8*  HCT 31.4*  MCV 105.7*  MCH 33.0  MCHC 31.2  RDW 16.5*  PLT 208   BNP Recent Labs  Lab 12/25/19 1401  BNP 422.2*    DDimer No results for input(s): DDIMER in the last 168 hours.   Radiology/Studies:  DG Chest Port 1 View  Result Date: 12/25/2019 CLINICAL DATA:  Shortness of breath, chest pain EXAM: PORTABLE CHEST 1 VIEW COMPARISON:  08/08/2019 FINDINGS: Prior CABG. Cardiomegaly. Vascular congestion and interstitial prominence may reflect early interstitial edema. No effusions or acute bony abnormality. IMPRESSION: Cardiomegaly with vascular congestion and possible early interstitial edema. Electronically Signed   By: Rolm Baptise M.D.   On: 12/25/2019 09:20   ECHOCARDIOGRAM COMPLETE  Result Date: 12/25/2019    ECHOCARDIOGRAM REPORT   Patient Name:   MASAYE GATCHALIAN Date of Exam: 12/25/2019 Medical Rec #:  [id number]          Height:       61.0 in Accession #:    [id number]         Weight:       215.0 lb Date of Birth:  09/28/1955          BSA:          1.948 m Patient Age:    89 years           BP:           126/55 mmHg Patient Gender: F                  HR:           61 bpm. Exam Location:  Inpatient Procedure: 2D Echo, Cardiac Doppler and Color Doppler Indications:     H47.42 Chronic systolic (congestive) heart failure  History:         Patient has prior history of Echocardiogram examinations, most                  recent 07/24/2019. CAD, Prior CABG, COPD, PAD, Carotid Disease                  and Stroke, Arrythmias:Atrial Fibrillation; Risk                   Factors:Hypertension, Diabetes, Dyslipidemia and Sleep Apnea.  ESRD.  Sonographer:     Jonelle Sidle Dance Referring Phys:  [id number] RONDELL A SMITH Diagnosing Phys: Oswaldo Milian MD IMPRESSIONS  1. Left ventricular ejection fraction, by estimation, is 55 to 60%. The left ventricle has grossly normal systolic function. Technically difficult study, unable to assess for regional wall motion abnormalities. There is moderate left ventricular hypertrophy. Left ventricular diastolic parameters are consistent with Grade III diastolic dysfunction (restrictive). Elevated left atrial pressure.  2. Right ventricle is poorly visualized. Right ventricular systolic function is grossly mildly reduced. The right ventricular size is normal. Tricuspid regurgitation signal is inadequate for assessing PA pressure.  3. Left atrial size was mildly dilated.  4. The mitral valve is abnormal. Moderate mitral annular calcification. Trivial mitral valve regurgitation.  5. The aortic valve was not well visualized. Aortic valve regurgitation is not visualized. Mild to moderate aortic valve stenosis (Vmax 2.7 m/s, MG 15 mmHg, AVA 1.2 cm^2, DI 0.46)  6. Aortic dilatation noted. There is mild dilatation of the ascending aorta measuring 36 mm.  7. The inferior vena cava is dilated in size with >50% respiratory variability, suggesting right atrial pressure of 8 mmHg. FINDINGS  Left Ventricle: Left ventricular ejection fraction, by estimation, is 55 to 60%. The left ventricle has normal function. The left ventricle has no regional wall motion abnormalities. The left ventricular internal cavity size was normal in size. There is  moderate left ventricular hypertrophy. Left ventricular diastolic parameters are consistent with Grade III diastolic dysfunction (restrictive). Elevated left atrial pressure. Right Ventricle: The right ventricular size is normal. Right vetricular wall thickness was not assessed. Right ventricular  systolic function is mildly reduced. Tricuspid regurgitation signal is inadequate for assessing PA pressure. Left Atrium: Left atrial size was mildly dilated. Right Atrium: Right atrial size was normal in size. Pericardium: Trivial pericardial effusion is present. Presence of pericardial fat pad. Mitral Valve: The mitral valve is abnormal. Moderate mitral annular calcification. Trivial mitral valve regurgitation. Tricuspid Valve: The tricuspid valve is normal in structure. Tricuspid valve regurgitation is not demonstrated. Aortic Valve: The aortic valve was not well visualized. Aortic valve regurgitation is not visualized. Mild to moderate aortic stenosis is present. Aortic valve mean gradient measures 14.0 mmHg. Aortic valve peak gradient measures 28.6 mmHg. Aortic valve area, by VTI measures 1.19 cm. Pulmonic Valve: The pulmonic valve was not well visualized. Pulmonic valve regurgitation is not visualized. Aorta: Aortic dilatation noted. There is mild dilatation of the ascending aorta measuring 36 mm. Venous: The inferior vena cava is dilated in size with greater than 50% respiratory variability, suggesting right atrial pressure of 8 mmHg. IAS/Shunts: The interatrial septum was not well visualized.  LEFT VENTRICLE PLAX 2D LVIDd:         3.90 cm  Diastology LVIDs:         2.70 cm  LV e' lateral:   6.96 cm/s LV PW:         1.50 cm  LV E/e' lateral: 24.6 LV IVS:        1.10 cm  LV e' medial:    4.90 cm/s LVOT diam:     1.80 cm  LV E/e' medial:  34.9 LV SV:         74 LV SV Index:   38 LVOT Area:     2.54 cm  RIGHT VENTRICLE            IVC RV Basal diam:  2.30 cm    IVC diam: 2.30 cm RV S prime:  8.38 cm/s TAPSE (M-mode): 1.7 cm LEFT ATRIUM             Index       RIGHT ATRIUM           Index LA diam:        4.30 cm 2.21 cm/m  RA Area:     14.20 cm LA Vol (A2C):   68.2 ml 35.02 ml/m RA Volume:   31.20 ml  16.02 ml/m LA Vol (A4C):   77.4 ml 39.74 ml/m LA Biplane Vol: 73.6 ml 37.79 ml/m  AORTIC VALVE AV Area  (Vmax):    1.11 cm AV Area (Vmean):   1.17 cm AV Area (VTI):     1.19 cm AV Vmax:           267.50 cm/s AV Vmean:          174.000 cm/s AV VTI:            0.618 m AV Peak Grad:      28.6 mmHg AV Mean Grad:      14.0 mmHg LVOT Vmax:         117.00 cm/s LVOT Vmean:        80.300 cm/s LVOT VTI:          0.290 m LVOT/AV VTI ratio: 0.47  AORTA Ao Root diam: 3.00 cm Ao Asc diam:  3.60 cm MITRAL VALVE MV Area (PHT): 2.00 cm     SHUNTS MV Decel Time: 380 msec     Systemic VTI:  0.29 m MV E velocity: 171.00 cm/s  Systemic Diam: 1.80 cm MV A velocity: 60.90 cm/s MV E/A ratio:  2.81 Oswaldo Milian MD Electronically signed by Oswaldo Milian MD Signature Date/Time: 12/25/2019/6:45:36 PM    Final (Updated)      Not ACS  Assessment and Plan:   Principal Problem:   Acute on chronic diastolic heart failure (HCC) Active Problems:   Morbid obesity - s/p Lap Band 9/'05   Coronary artery disease involving native coronary artery of native heart with angina pectoris (HCC)   Dyslipidemia, goal LDL below 70   Paroxysmal atrial fibrillation (HCC); CHA2DS2VASc score F, HTN, CAD, CVA = 5   Mild aortic stenosis by prior echocardiogram   Renal transplant, status post   Long term (current) use of anticoagulants   Obstructive chronic bronchitis without exacerbation (HCC)   ACUTE ON CHRONIC DIASTOLIC HEART FAILURE: Echocardiogram pending on my evaluation, but she does aphasia grade 2 diastolic dysfunction in the setting of known CAD.  Usually this is exacerbated when she has A. fib, and therefore difficult to determine the cause for her recent exacerbation.  It all seems to be associated around her COVID-19 vaccine.  As such, cannot exclude the possibility of vaccine-related myocarditis.  Current echo suggests grade 3 diastolic function-> consider cardiac MRI to exclude myocarditis  Agree with IV diuresis -> started with 40 mg IV twice daily is reasonable.  Would continue home dose of metoprolol as well as  diltiazem as this is been in place for her A. Fib.  She has not been on ACE inhibitor or ARB because of her baseline renal condition.  Hypertension has not been an issue in the past.  Will monitor and adjust accordingly.    Defer treatment of COPD to primary team.  Question which caused which.  CAD: Continue aspirin and statin as well as beta-blocker.  I suspect trivial troponin elevation is probably related to diastolic heart failure.  She has no signs  of systolic heart failure however we will await results of echocardiogram.  Not on aspirin or Plavix because of Eliquis.  PAF: Seems to be maintaining sinus rhythm.  Would continue home medications.  Monitor on telemetry.  On Tikosyn for rhythm control along with metoprolol and diltiazem for rate control.--  On Eliquis for anticoagulation.  Mild aortic stenosis -> no real change in murmur.  Unlikely to have a significant transition to severe aortic stenosis since her December echo.  Will review current echo.   ADDENDUM: Echo results reviewed     12/25/2019-2D echo: EF 55-60%.  No R WMA.  Moderate LVH with apparent grade 3 diastolic function (elevated LAP) -> has previously been grade 2.  Poorly visualized RV.  Unable to assess RV pressures.  Moderate MAC.  Mild to moderate aortic stenosis-stable (mean gradient 15 mm).  Dilated IVC suggesting elevated right atrial pressures.   For questions or updates, please contact Elizabeth Please consult [email or web address] for contact info under     Signed, [name], MD  12/26/2019 12:56 AM

## 2019-12-26 NOTE — Progress Notes (Signed)
[facility] Progress Note  Patient: Katherine [name]    EZM:[id number]  DOA: 12/25/2019     Date of Service: the patient was seen and examined on 12/26/2019  Chief Complaint  Patient presents with  . Shortness of Breath   Brief hospital course: Past medical history of CAD SP CABG, COPD, type II DM, renal transplant on chronic steroids, depression, fibromyalgia, chronic pain, PVD, GERD, OSA not on CPAP after her lap band, CVA, obesity, rheumatoid arthritis.  Patient presented with complaints of malaise and shortness of breath after receiving Pfizer Covid vaccine last Wednesday. Currently further plan is continue treating heart failure.  Assessment and Plan: 1.  Acute on chronic diastolic CHF Volume still remains elevated. Cardiology consulted appreciate assistance. Continue with IV Lasix 40 mg twice daily. Daily weight and ins and outs. Monitor on telemetry.  2.  Malaise. Low-grade temperature. Patient reported generalized malaise ongoing after her Covid vaccination on Wednesday. She had low-grade temperature yesterday as well. Treated last me that she think that she needs antibiotics. At present she does not have any complaints of chest pain, abdominal pain, nausea, vomiting, diarrhea, constipation, cough. No further fever other than yesterday. Chest x-ray is does not show any evidence of pneumonia. Clinically patient does not have any UTI symptoms or GI infection symptoms. At present I do not recommend any antibiotic. Check respiratory virus pathogen panel, CRP, ESR, procalcitonin level.  3.  History of COPD. Continue inhalers and nebulizers. Patient is in Solu-Medrol. I present I do not think that the patient is suffering from COPD exacerbation.  Monitor.  4.  Paroxysmal A. fib. Chronic anticoagulation. Essential hypertension. CAD. Mildly elevated troponin. Currently rate controlled. Continue Eliquis and Tikosyn. Also continue Cardizem and metoprolol per  cardiology. Continuing aspirin and statin as well. Patient had mildly elevated troponin of 18 not consistent with any cardiac injury. Cardiology has concern regarding vaccine related myocarditis and considering further options.  5.  History of renal transplant. Chronic immunosuppressive therapy. Continue home regimen of prednisone and Imuran.  6.  Anxiety, depression, fibromyalgia Currently stable no suicidal homicidal ideation. Continue Cymbalta and Zoloft.  7.  Chronic pain. Continue home regimen of oxycodone.  8.  GERD. Continue PPI.  9.  Morbid obesity SP lap band surgery Body mass index is 40 kg/m.  Prior history of OSA not on CPAP anymore. Patient will benefit from repeat outpatient sleep study given her BMI and prior history.  Diet: Cardiac diet DVT Prophylaxis: Therapeutic Anticoagulation with Eliquis   Advance goals of care discussion: Full code  Family Communication: no family was present at bedside, at the time of interview.   Disposition:  Status is: Inpatient  Remains inpatient appropriate because:IV treatments appropriate due to intensity of illness or inability to take PO   Dispo: The patient is from: Home              Anticipated d/c is to: Home              Anticipated d/c date is: 2 days              Patient currently is not medically stable to d/c.  Subjective: Continues to feel fatigue.  Continues to have shortness of breath.  Continues to have generalized body ache.  She feels feverish although no fever documented.  No nausea no vomiting.  No cough.  No diarrhea.  No burning urination.  No rash anywhere.  No lumps or bumps anywhere.  Physical Exam: General:  alert oriented  to time, place, and person.  Appear in mild distress, affect appropriate Eyes: PERRL ENT: Oral Mucosa Clear, moist  Neck: difficult to assess  JVD,  Cardiovascular: S1 and S2 Present, no Murmur,  Respiratory: good respiratory effort, Bilateral Air entry equal and Decreased,  bilateral  Crackles, no wheezes Abdomen: Bowel Sound present, Soft and no tenderness,  Skin: no rash Extremities: trace Pedal edema, no calf tenderness Neurologic: without any new focal findings Gait not checked due to patient safety concerns  Vitals:   12/26/19 1138 12/26/19 1625 12/26/19 1935 12/26/19 1944  BP: (!) 116/45 128/69 134/60   Pulse: (!) 50 (!) 55 67   Resp: '16 20 18   '$ Temp: 98.5 F (36.9 C) 99.3 F (37.4 C) 99.8 F (37.7 C)   TempSrc: Oral Oral Oral   SpO2: 94% 96% 94% 95%  Weight:      Height:        Intake/Output Summary (Last 24 hours) at 12/26/2019 2048 Last data filed at 12/26/2019 1844 Gross per 24 hour  Intake 1563 ml  Output 1300 ml  Net 263 ml   Filed Weights   12/25/19 0730 12/25/19 1740 12/26/19 0021  Weight: 97.5 kg 96.3 kg 96 kg    Data Reviewed: I have personally reviewed and interpreted daily labs, tele strips, imagings as discussed above. I reviewed all nursing notes, pharmacy notes, vitals, pertinent old records I have discussed plan of care as described above with RN and patient/family.  CBC: Recent Labs  Lab 12/25/19 0730 12/26/19 0606  WBC 7.1 4.6  NEUTROABS 5.6  --   HGB 9.8* 10.2*  HCT 31.4* 32.6*  MCV 105.7* 104.2*  PLT 208 038   Basic Metabolic Panel: Recent Labs  Lab 12/25/19 0730 12/26/19 0606  NA 138 138  K 4.0 3.7  CL 104 99  CO2 23 28  GLUCOSE 164* 112*  BUN 8 12  CREATININE 0.84 0.94  CALCIUM 8.9 9.2    Studies: No results found.  Scheduled Meds: . apixaban  5 mg Oral BID  . arformoterol  15 mcg Nebulization BID  . azaTHIOprine  125 mg Oral QPM  . budesonide  2 mL Nebulization BID  . calcitRIOL  0.25 mcg Oral Q3 days  . clotrimazole  10 mg Oral 5 X Daily  . diltiazem  120 mg Oral Daily  . dofetilide  250 mcg Oral BID  . DULoxetine  60 mg Oral Daily  . fluticasone  2 spray Each Nare Daily  . furosemide  40 mg Intravenous BID  . insulin aspart  0-9 Units Subcutaneous TID WC  . insulin glargine  12  Units Subcutaneous Daily  . lamoTRIgine  25 mg Oral QHS  . metoprolol tartrate  50 mg Oral BID  . montelukast  10 mg Oral Daily  . pantoprazole  40 mg Oral Daily  . predniSONE  5 mg Oral Daily  . promethazine  25 mg Oral QHS  . rosuvastatin  20 mg Oral Daily  . sertraline  200 mg Oral QHS  . sodium chloride flush  3 mL Intravenous Q12H   Continuous Infusions: . sodium chloride     PRN Meds: sodium chloride, acetaminophen, albuterol, cyclobenzaprine, LORazepam, ondansetron (ZOFRAN) IV, oxyCODONE, sodium chloride flush  Time spent: 35 minutes  Author: Berle Mull, MD [facility] 12/26/2019 8:48 PM  To reach On-call, see care teams to locate the attending and reach out to them via [email or web address]. If 7PM-7AM, please contact night-coverage If you still have difficulty reaching the  attending provider, please page the [facility] (Director on Call) for [facility] on amion for assistance.   [employer] Wednesday  Constipated Sunday.

## 2019-12-27 ENCOUNTER — Telehealth: Payer: Self-pay | Admitting: Cardiology

## 2019-12-27 ENCOUNTER — Inpatient Hospital Stay (HOSPITAL_COMMUNITY): Payer: Medicare Other

## 2019-12-27 DIAGNOSIS — R531 Weakness: Secondary | ICD-10-CM

## 2019-12-27 DIAGNOSIS — J441 Chronic obstructive pulmonary disease with (acute) exacerbation: Secondary | ICD-10-CM

## 2019-12-27 DIAGNOSIS — I340 Nonrheumatic mitral (valve) insufficiency: Secondary | ICD-10-CM

## 2019-12-27 DIAGNOSIS — R5381 Other malaise: Secondary | ICD-10-CM

## 2019-12-27 LAB — BASIC METABOLIC PANEL
Anion gap: 11 (ref 5–15)
BUN: 14 mg/dL (ref 8–23)
CO2: 27 mmol/L (ref 22–32)
Calcium: 9.4 mg/dL (ref 8.9–10.3)
Chloride: 99 mmol/L (ref 98–111)
Creatinine, Ser: 0.91 mg/dL (ref 0.44–1.00)
GFR calc Af Amer: >60 mL/min (ref >60)
GFR calc non Af Amer: >60 mL/min (ref >60)
Glucose, Bld: 178 mg/dL — ABNORMAL HIGH (ref 70–99)
Potassium: 3.8 mmol/L (ref 3.5–5.1)
Sodium: 137 mmol/L (ref 135–145)

## 2019-12-27 LAB — CBC WITH DIFFERENTIAL/PLATELET
Abs Immature Granulocytes: 0.02 10*3/uL (ref 0.00–0.07)
Basophils Absolute: 0 10*3/uL (ref 0.0–0.1)
Basophils Relative: 0 %
Eosinophils Absolute: 0.2 10*3/uL (ref 0.0–0.5)
Eosinophils Relative: 3 %
HCT: 32.9 % — ABNORMAL LOW (ref 36.0–46.0)
Hemoglobin: 10.3 g/dL — ABNORMAL LOW (ref 12.0–15.0)
Immature Granulocytes: 0 %
Lymphocytes Relative: 14 %
Lymphs Abs: 1.1 10*3/uL (ref 0.7–4.0)
MCH: 31.9 pg (ref 26.0–34.0)
MCHC: 31.3 g/dL (ref 30.0–36.0)
MCV: 101.9 fL — ABNORMAL HIGH (ref 80.0–100.0)
Monocytes Absolute: 0.9 10*3/uL (ref 0.1–1.0)
Monocytes Relative: 12 %
Neutro Abs: 5.2 10*3/uL (ref 1.7–7.7)
Neutrophils Relative %: 71 %
Platelets: 271 10*3/uL (ref 150–400)
RBC: 3.23 MIL/uL — ABNORMAL LOW (ref 3.87–5.11)
RDW: 16 % — ABNORMAL HIGH (ref 11.5–15.5)
WBC: 7.4 10*3/uL (ref 4.0–10.5)
nRBC: 0 % (ref 0.0–0.2)

## 2019-12-27 LAB — GLUCOSE, CAPILLARY
Glucose-Capillary: 144 mg/dL — ABNORMAL HIGH (ref 70–99)
Glucose-Capillary: 170 mg/dL — ABNORMAL HIGH (ref 70–99)
Glucose-Capillary: 153 mg/dL — ABNORMAL HIGH (ref 70–99)

## 2019-12-27 LAB — MAGNESIUM: Magnesium: 1.5 mg/dL — ABNORMAL LOW (ref 1.7–2.4)

## 2019-12-27 MED ORDER — POTASSIUM CHLORIDE CRYS ER 20 MEQ PO TBCR
40.0000 meq | EXTENDED_RELEASE_TABLET | Freq: Once | ORAL | Status: AC
  Administered 2019-12-27: 40 meq via ORAL
  Filled 2019-12-27: qty 2

## 2019-12-27 MED ORDER — FUROSEMIDE 40 MG PO TABS
40.0000 mg | ORAL_TABLET | Freq: Two times a day (BID) | ORAL | Status: DC
  Administered 2019-12-28 – 2019-12-29 (×3): 40 mg via ORAL
  Filled 2019-12-27 (×3): qty 1

## 2019-12-27 MED ORDER — GADOBUTROL 1 MMOL/ML IV SOLN
10.0000 mL | Freq: Once | INTRAVENOUS | Status: AC | PRN
  Administered 2019-12-27: 10 mL via INTRAVENOUS

## 2019-12-27 MED ORDER — ALUM & MAG HYDROXIDE-SIMETH 200-200-20 MG/5ML PO SUSP
30.0000 mL | Freq: Once | ORAL | Status: AC
  Administered 2019-12-27: 30 mL via ORAL
  Filled 2019-12-27: qty 30

## 2019-12-27 MED ORDER — MAGNESIUM SULFATE 4 GM/100ML IV SOLN
4.0000 g | Freq: Once | INTRAVENOUS | Status: AC
  Administered 2019-12-27: 4 g via INTRAVENOUS
  Filled 2019-12-27: qty 100

## 2019-12-27 NOTE — Plan of Care (Signed)
  Problem: Education: Goal: Knowledge of General Education information will improve Description: Including pain rating scale, medication(s)/side effects and non-pharmacologic comfort measures Outcome: Progressing   Problem: Health Behavior/Discharge Planning: Goal: Ability to manage health-related needs will improve Outcome: Progressing   Problem: Clinical Measurements: Goal: Ability to maintain clinical measurements within normal limits will improve Outcome: Progressing   Problem: Clinical Measurements: Goal: Diagnostic test results will improve Outcome: Progressing   Problem: Clinical Measurements: Goal: Respiratory complications will improve Outcome: Progressing   Problem: Clinical Measurements: Goal: Cardiovascular complication will be avoided Outcome: Progressing   Problem: Coping: Goal: Level of anxiety will decrease Outcome: Progressing   Problem: Pain Managment: Goal: General experience of comfort will improve Outcome: Progressing

## 2019-12-27 NOTE — Progress Notes (Signed)
'  PT Cancellation Note  Patient Details Name: Katherine [name] MRN: [id number] DOB: 1956-02-11   Cancelled Treatment:    Reason Eval/Treat Not Completed: Patient declined, no reason specified Pt reporting does not feel like doing therapy today. Educated about role of PT/OT, however, pt continued to refuse. Will follow up as schedule allows.   Lou Miner, DPT  Acute Rehabilitation Services  Pager: [phone] Office: [phone]    Rudean Hitt 12/27/2019, 3:56 PM

## 2019-12-27 NOTE — Progress Notes (Signed)
OT Cancellation Note  Patient Details Name: Rayvn Rickerson MRN: [id number] DOB: 03-10-1956   Cancelled Treatment:    Reason Eval/Treat Not Completed: Patient declined, no reason specified(Pt denied therapy intervention this date despite education of importance. Will continue to follow as schedule permits.)  Zenovia Jarred, MSOT, OTR/L Acute Rehabilitation Services [facility] Office Number: [phone] Pager: [phone]  Zenovia Jarred 12/27/2019, 5:45 PM

## 2019-12-27 NOTE — Plan of Care (Signed)

## 2019-12-27 NOTE — Telephone Encounter (Signed)
Pharmacy c/o medication issue:  1. Name of Medication:  apixaban (ELIQUIS) tablet 5 mg Carbamazepine 200 mg   2. How are you currently taking this medication (dosage and times per day)?  Carbamazepine 200 mg 2x daily  apixaban (ELIQUIS) tablet 5 mg 2x daily   3. Are you having a reaction (difficulty breathing--STAT)?   4. What is your medication issue? Pharmacy called and said that the Carbamazepine can decrease the efficacy of the Eliquis. The patient has been on the other medication for a while, but this is the first time the patient will be refilling the Eliquis. The pharmacy wanted to make sure Dr. Ellyn Hack is aware of the interaction.

## 2019-12-27 NOTE — Telephone Encounter (Signed)
Called patient and confirmed that she is no longer taking carbamazepine. Patient confirmed she is not on medication anymore. I have removed it from her medication list and called [facility] to have them discontinue rx.

## 2019-12-27 NOTE — Consult Note (Signed)
   [facility] Inpatient Consult   12/27/2019  [name] 04-13-1956 [id number]  [facility] ACO Patient:  Medicare NextGen   Patient screened for high risk score for unplanned readmission score  and for 3 hospitalizations in the past 6 months.  Review  to check for potential [facility] services are needed.    Review of patient's medical record reveals patient is from HPI from History and Physical is as follows which includes but not limited to per Dr. Fuller Plan 12/25/2019:  Katherine [name] is a 64 y.o. female with medical history significant of CAD s/p CABG and multiple stents, COPD, DM type II, s/p renal transplant 1991 on chronic steroids, anxiety, depression, myalgia, and chronic pain.  She presents with complaints of having difficulty breathing.  She received her first Covid vaccine 5 days ago.  Since that time she has felt sluggish, had a mildly productive cough, and had   fevers up to 101 F.  She has been wearing her oxygen 24/7 since that time.    Primary Care Provider is Scarlette Calico, MD with [facility] and this provider is listed to provide the transition of care [TOC} follow up.  Plan: Continue to follow progress and disposition to assess for post hospital care management needs.  Disposition is currently unclear at this time.  Please place a [facility] Care Management consult as appropriate and for questions contact:   Natividad Brood, RN BSN [facility] Liaison  [phone] business mobile phone Toll free office [phone]  Fax number: [phone] [name]

## 2019-12-27 NOTE — Progress Notes (Signed)
Progress Note  Patient Name: Katherine [name] Date of Encounter: 12/27/2019  Primary Cardiologist: [name], MD   Subjective   Feels little better.  Still has some soreness in her chest when she takes deep breath, but says her breathing is overall better. Still has a full body aches.   Inpatient Medications    Scheduled Meds: . apixaban  5 mg Oral BID  . arformoterol  15 mcg Nebulization BID  . azaTHIOprine  125 mg Oral QPM  . budesonide  2 mL Nebulization BID  . calcitRIOL  0.25 mcg Oral Q3 days  . clotrimazole  10 mg Oral 5 X Daily  . diltiazem  120 mg Oral Daily  . dofetilide  250 mcg Oral BID  . DULoxetine  60 mg Oral Daily  . fluticasone  2 spray Each Nare Daily  . furosemide  40 mg Intravenous BID  . insulin aspart  0-9 Units Subcutaneous TID WC  . insulin glargine  12 Units Subcutaneous Daily  . lamoTRIgine  25 mg Oral QHS  . metoprolol tartrate  50 mg Oral BID  . montelukast  10 mg Oral Daily  . pantoprazole  40 mg Oral Daily  . predniSONE  5 mg Oral Daily  . promethazine  25 mg Oral QHS  . rosuvastatin  20 mg Oral Daily  . sertraline  200 mg Oral QHS  . sodium chloride flush  3 mL Intravenous Q12H   Continuous Infusions: . sodium chloride     PRN Meds: sodium chloride, acetaminophen, albuterol, cyclobenzaprine, LORazepam, ondansetron (ZOFRAN) IV, oxyCODONE, sodium chloride flush   Vital Signs    Vitals:   12/27/19 0743 12/27/19 0928 12/27/19 1942 12/27/19 1947  BP:  (!) 118/56 (!) 148/62   Pulse:  70 66   Resp:  20 17   Temp:  98.3 F (36.8 C) 99 F (37.2 C)   TempSrc:  Oral Oral   SpO2: 92% 94% 92% 93%  Weight:      Height:        Intake/Output Summary (Last 24 hours) at 12/27/2019 2215 Last data filed at 12/27/2019 1945 Gross per 24 hour  Intake 700 ml  Output 2200 ml  Net -1500 ml   Last 3 Weights 12/27/2019 12/26/2019 12/25/2019  Weight (lbs) 210 lb 3.2 oz 211 lb 11.2 oz 212 lb 4.8 oz  Weight (kg) 95.346 kg 96.026 kg 96.299 kg      Telemetry    NSR without significant ventricular ectopy - Personally Reviewed  ECG    NSR without significant ST-T wave changes - Personally Reviewed  Physical Exam   GEN: No acute distress.  Resting comfortably.  Seen before and after her MRI. Neck: No JVD Cardiac:  Distant S1 and S2.  Cannot assess PMI due to body habitus.  Possible S4 gallop.  1/6 HSM at apex and 2/6 SEM at RUSB  Respiratory:  CTA B, nonlabored.  Minimal bibasal crackles.   GI:  Soft/NT/ND centimeters.  No exam. MS:  No C/C/E.  No obvious deformities. Neuro:  A&O x3, nonfocal Psych: Normal affect   Labs    High Sensitivity Troponin:   Recent Labs  Lab 12/25/19 0730 12/25/19 1401  TROPONINIHS 18* 15      Chemistry Recent Labs  Lab 12/25/19 0730 12/26/19 0606 12/27/19 0824  NA 138 138 137  K 4.0 3.7 3.8  CL 104 99 99  CO2 _0 GLUCOSE 164* 112* 178*  BUN _1 CREATININE 0.84  0.94 0.91  CALCIUM 8.9 9.2 9.4  GFRNONAA >60 >60 >60  GFRAA >60 >60 >60  ANIONGAP _0 Hematology Recent Labs  Lab 12/25/19 0730 12/26/19 0606 12/27/19 0824  WBC 7.1 4.6 7.4  RBC 2.97* 3.13* 3.23*  HGB 9.8* 10.2* 10.3*  HCT 31.4* 32.6* 32.9*  MCV 105.7* 104.2* 101.9*  MCH 33.0 32.6 31.9  MCHC 31.2 31.3 31.3  RDW 16.5* 16.3* 16.0*  PLT 208 223 271    BNP Recent Labs  Lab 12/25/19 1401  BNP 422.2*     DDimer No results for input(s): DDIMER in the last 168 hours.   Radiology    New finding today MR CARDIAC MORPHOLOGY W WO CONTRAST  Result Date: 12/27/2019 CLINICAL DATA:  64 year old with acute diastolic CHF, CAD, post renal transplant, atrial fibrillation being evaluated for a possible myocarditis. EXAM: CARDIAC MRI TECHNIQUE: The patient was scanned on a 1.5 Tesla GE magnet. A dedicated cardiac coil was used. Functional imaging was done using Fiesta sequences. 2,3, and 4 chamber views were done to assess for RWMA's. Modified Simpson's rule using a short axis stack was used to  calculate an ejection fraction on a dedicated work Conservation officer, nature. The patient received 10 cc of Gadavist. After 10 minutes inversion recovery sequences were used to assess for infiltration and scar tissue. CONTRAST:  10 cc  of Gadavist FINDINGS: 1. Normal left ventricular size, with mild basal septal hypertrophy and normal systolic function (LVEF = 56%). There is hypokinesis of the mid anteroseptal, anterior and akinesis of the apical septal, anterior and inferior walls. There is late gadolinium enhancement in the mid anteroseptal, anterior walls (50-75%) and in the apical septal, anterior and inferior walls (75-100%). LVEDD: 50 mm LVESD: 46 mm LVEDV: 144 ml LVESV: 64 ml SV: 80 ml CO: 4.3 L/min Myocardial mass: 122 g 2. Normal right ventricular size, thickness and systolic function (LVEF = 62%). There are no regional wall motion abnormalities. 3.  Moderately dilated left atrium. Normal right atrial size. 4. Normal size of the aortic root, ascending aorta. Mildly dilated pulmonary artery measuring 31 mm. 5.  Mild mitral and trivial tricuspid regurgitation. 6.  Normal pericardium.  No pericardial effusion. IMPRESSION: 1. Normal left ventricular size, with mild basal septal hypertrophy and normal systolic function (LVEF = 56%). There is hypokinesis of the mid anteroseptal, anterior and akinesis of the apical septal, anterior and inferior walls. There is late gadolinium enhancement in the mid anteroseptal, anterior walls (50-75%) and in the apical septal, anterior and inferior walls (75-100%). 2. Normal right ventricular size, thickness and systolic function (LVEF = 62%). There are no regional wall motion abnormalities. 3.  Moderately dilated left atrium. Normal right atrial size. 4. Normal size of the aortic root, ascending aorta. Mildly dilated pulmonary artery measuring 31 mm. 5.  Mild mitral and trivial tricuspid regurgitation. 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 Overall LVEF is preserved. There is no evidence for an acute myocarditis. Electronically Signed   By: Ena Dawley   On: 12/27/2019 18:02    Cardiac Studies   Echo 12/25/2019: EF stable at 55 to 60%.  Unable assess R WMA.  GR 3 DD with elevated LAP.  Mildly reduced RV function.  Unable to assess PA pressures.  Mild LA dilation.  Moderate MAC with trivial MR.  Mild to moderate AS (mean gradient 15 mmHg) dilated IVC suggesting RAP roughly 8 mmHg.   Patient Profile  64 y.o. female with PMH of CAD s/p CABG, persistent atrial fibrillation, aortic stenosis, ESRD s/p renal transplant, COPD on home O2, and morbid obesity s/p lap band who presented with increasing dyspnea.   Assessment & Plan    1. Acute on chronic diastolic heart failure -Echo shows normal EF but now grade 3 versus grade 2 diastolic dysfunction.  symptom occurred shortly after COVID vaccine suspect that this may be part of an inflammatory process post injection.  associated with chest pain on inspiration -> this is actually a chronic finding for her not new.  Cardiac MRI ordered to evaluate for possible myocarditis.  No evidence of intrainfiltrative disease or myocarditis.  There is evidence of a prior infarct which was seen in previous nuclear stress test.    - not clear if could be a potential candidate for cardiac MRI with gadolinium vs PYP scan. H/o ESRD s/p renal transplant, Cr 0.94 today  - currently on IV lasix 63m BID, but appears to be somewhat euvolemic on exam.  Has had brisk urine output.  Would convert back to oral Lasix 40 mg twice daily tomorrow.  Has not been on Lasix in the past at home.  May need to these discharge on 40 mg daily.  2. CAD s/p CABG: No real anginal symptoms.  She is having chest pain with inspiration and palpation.  These symptoms have previous been evaluated and have shown no evidence of ischemia on Myoview.  3. Persistent atrial fibrillation: Successfully maintaining  sinus rhythm on Tikosyn along with diltiazem and Toprol for rate control.    Continue Eliquis for anticoagulation.  4. Aortic stenosis: mild to moderate AS -stable on echo.  Not likely related to current episode.  5. ESRD s/p renal transplant: renal function normal.  6. COPD on home O2  MRI results are very reassuring.  No evidence of myocarditis.  I suspect that volume overload is related to increased inflammation leading to worsening diastolic function.  Thankfully there is no infiltrative process seen on MRI. Plan for now will be to convert to oral Lasix in the morning.  May very well be okay for discharge from a cardiac standpoint in the next day or 2.      For questions or updates, please contact CHiramPlease consult [email or web address] for contact info under        Signed, DGlenetta Hew MD  M.S. nterventional Cardiologist  12/27/2019, 10:15 PM      [address] [place] [address]

## 2019-12-27 NOTE — Progress Notes (Addendum)
PROGRESS NOTE  Katherine [name] MRN:[id number] DOB: 03/07/1956   PCP: Jones, Thomas L, MD  Patient is from: Home  DOA: 12/25/2019 LOS: 2  Brief Narrative / Interim history: 64-year-old female history of CAD/CABG, COPD, type II DM, renal transplant on chronic steroids, depression, fibromyalgia, chronic pain, PVD, GERD, OSA not on CPAP after her lap band, CVA, obesity, rheumatoid arthritis.  Patient presented with complaints of malaise and shortness of breath after receiving Pfizer Covid vaccine last Wednesday.  She is now being treated for CHF exacerbation and possible myocarditis from Covid vaccine.  Cardiology managing.  Subjective: Seen and examined earlier this morning.  No major events overnight of this morning.  Feels tired and sleepy this morning.  She felt lightheaded when she got up.  She also reports chest pain with deep breathing that she describes as achy.  Pain is over the anterior chest.  Denies GI or UTI symptoms.  Objective: Vitals:   12/26/19 1944 12/27/19 0540 12/27/19 0743 12/27/19 0928  BP:  (!) 147/65  (!) 118/56  Pulse:  70  70  Resp:  16  20  Temp:  98.6 F (37 C)  98.3 F (36.8 C)  TempSrc:  Oral  Oral  SpO2: 95% 91% 92% 94%  Weight:  95.3 kg    Height:        Intake/Output Summary (Last 24 hours) at 12/27/2019 1512 Last data filed at 12/27/2019 0900 Gross per 24 hour  Intake 840 ml  Output 2200 ml  Net -1360 ml   Filed Weights   12/25/19 1740 12/26/19 0021 12/27/19 0540  Weight: 96.3 kg 96 kg 95.3 kg    Examination:  GENERAL: No apparent distress.  Nontoxic. HEENT: MMM.  Vision and hearing grossly intact.  NECK: Supple.  No apparent JVD.  RESP:  No IWOB.  Fair aeration bilaterally. CVS:  RRR. Heart sounds normal.  ABD/GI/GU: BS+. Abd soft, NTND.  MSK/EXT:  Moves extremities. No apparent deformity. No edema.  SKIN: no apparent skin lesion or wound NEURO: Awake, alert and oriented appropriately.  No apparent focal neuro deficit. PSYCH: Calm.  Normal affect.  Procedures:  None  Microbiology summarized: COVID-19 PCR negative. Respiratory viral panel negative.  Assessment & Plan: Acute on chronic diastolic CHF: Echo with EF of 55 to 60%, G3DD, mild to mod AS.  Unable to assess RWM or diastolic function.  Diuresing well with IV Lasix.  Renal function is stable. -Cardiology managing-on IV Lasix 40 mg twice daily -Cardiac MRI to exclude myocarditis  Malaise/low-grade temperature: Could be related to COVID-19 vaccine.  CRP and ESR elevated.  RVP and pro-Cal negative.  CXR without evidence of pneumonia. -Cardiac MRI as above -May consider autoimmune labs if no improvement  Controlled DM-2 with hyperglycemia: A1c 5.6%.  Hyperglycemia could be due to steroid. Recent Labs    12/26/19 1622 12/26/19 2141 12/27/19 0627  GLUCAP 260* 182* 144*  -Continue Lantus and SSI   Chronic COPD: Stable. -Continue home low-dose prednisone and breathing treatments  Paroxysmal A. Fib:  -Continue Tikosyn, Cardizem, metoprolol and Eliquis  Essential hypertension: Normotensive. -Continue home meds  Hypomagnesemia: -Replenish and recheck.  CAD s/p CABG: Endorses chest pain which is very atypical.  Has mildly elevated troponin. Mildly elevated troponin.  Concern about vaccine related myocarditis. -Audiology following-on metoprolol, started on aspirin.  History of renal transplant. Chronic immunosuppressive therapy. -Continue home regimen of prednisone and Imuran.  Anxiety/depression/fibromyalgia/chronic pain: Stable.  No SI or HI. -Continue oxycodone, Cymbalta and Zoloft.  GERD. -Continue PPI.    Morbid obesity s/p lap band surgery. Body mass index is 39.72 kg/m. -Encourage lifestyle change to lose weight.  Generalized weakness/malaise -PT/OT          DVT prophylaxis: On Eliquis for A. fib Code Status: Full code Family Communication: Patient and/or RN. Available if any question.  Status is: Inpatient  Remains  inpatient appropriate because:Ongoing diagnostic testing needed not appropriate for outpatient work up, IV treatments appropriate due to intensity of illness or inability to take PO and Inpatient level of care appropriate due to severity of illness   Dispo: The patient is from: Home              Anticipated d/c is to: Home              Anticipated d/c date is: 2 days              Patient currently is not medically stable to d/c.        Consultants:  Cardiology   Sch Meds:  Scheduled Meds: . apixaban  5 mg Oral BID  . arformoterol  15 mcg Nebulization BID  . azaTHIOprine  125 mg Oral QPM  . budesonide  2 mL Nebulization BID  . calcitRIOL  0.25 mcg Oral Q3 days  . clotrimazole  10 mg Oral 5 X Daily  . diltiazem  120 mg Oral Daily  . dofetilide  250 mcg Oral BID  . DULoxetine  60 mg Oral Daily  . fluticasone  2 spray Each Nare Daily  . furosemide  40 mg Intravenous BID  . insulin aspart  0-9 Units Subcutaneous TID WC  . insulin glargine  12 Units Subcutaneous Daily  . lamoTRIgine  25 mg Oral QHS  . metoprolol tartrate  50 mg Oral BID  . montelukast  10 mg Oral Daily  . pantoprazole  40 mg Oral Daily  . predniSONE  5 mg Oral Daily  . promethazine  25 mg Oral QHS  . rosuvastatin  20 mg Oral Daily  . sertraline  200 mg Oral QHS  . sodium chloride flush  3 mL Intravenous Q12H   Continuous Infusions: . sodium chloride    . magnesium sulfate bolus IVPB     PRN Meds:.sodium chloride, acetaminophen, albuterol, cyclobenzaprine, LORazepam, ondansetron (ZOFRAN) IV, oxyCODONE, sodium chloride flush  Antimicrobials: Anti-infectives (From admission, onward)   None       I have personally reviewed the following labs and images: CBC: Recent Labs  Lab 12/25/19 0730 12/26/19 0606 12/27/19 0824  WBC 7.1 4.6 7.4  NEUTROABS 5.6  --  5.2  HGB 9.8* 10.2* 10.3*  HCT 31.4* 32.6* 32.9*  MCV 105.7* 104.2* 101.9*  PLT 208 223 271   BMP &GFR Recent Labs  Lab 12/25/19 0730  12/26/19 0606 12/27/19 0824  NA 138 138 137  K 4.0 3.7 3.8  CL 104 99 99  CO2 _0 GLUCOSE 164* 112* 178*  BUN _1 CREATININE 0.84 0.94 0.91  CALCIUM 8.9 9.2 9.4  MG  --   --  1.5*   Estimated Creatinine Clearance: 67.6 mL/min (by C-G formula based on SCr of 0.91 mg/dL). Liver & Pancreas: No results for input(s): AST, ALT, ALKPHOS, BILITOT, PROT, ALBUMIN in the last 168 hours. No results for input(s): LIPASE, AMYLASE in the last 168 hours. No results for input(s): AMMONIA in the last 168 hours. Diabetic: Recent Labs    12/25/19 1401  HGBA1C 5.6   Recent Labs  Lab 12/26/19 0622  12/26/19 1139 12/26/19 1622 12/26/19 2141 12/27/19 0627  GLUCAP 108* 181* 260* 182* 144*   Cardiac Enzymes: No results for input(s): CKTOTAL, CKMB, CKMBINDEX, TROPONINI in the last 168 hours. No results for input(s): PROBNP in the last 8760 hours. Coagulation Profile: No results for input(s): INR, PROTIME in the last 168 hours. Thyroid Function Tests: No results for input(s): TSH, T4TOTAL, FREET4, T3FREE, THYROIDAB in the last 72 hours. Lipid Profile: No results for input(s): CHOL, HDL, LDLCALC, TRIG, CHOLHDL, LDLDIRECT in the last 72 hours. Anemia Panel: No results for input(s): VITAMINB12, FOLATE, FERRITIN, TIBC, IRON, RETICCTPCT in the last 72 hours. Urine analysis:    Component Value Date/Time   COLORURINE YELLOW 08/08/2019 1722   APPEARANCEUR CLEAR 08/08/2019 1722   LABSPEC 1.011 08/08/2019 1722   PHURINE 9.0 (H) 08/08/2019 1722   GLUCOSEU NEGATIVE 08/08/2019 1722   HGBUR NEGATIVE 08/08/2019 1722   BILIRUBINUR NEGATIVE 08/08/2019 1722   KETONESUR NEGATIVE 08/08/2019 1722   PROTEINUR NEGATIVE 08/08/2019 1722   UROBILINOGEN 0.2 10/16/2013 0110   NITRITE NEGATIVE 08/08/2019 1722   LEUKOCYTESUR NEGATIVE 08/08/2019 1722   Sepsis Labs: Invalid input(s): PROCALCITONIN, [place]  Microbiology: Recent Results (from the past 240 hour(s))  SARS Coronavirus 2 by RT PCR  (hospital order, performed in [facility] hospital lab) Nasopharyngeal Nasopharyngeal Swab     Status: None   Collection Time: 12/25/19  8:27 AM   Specimen: Nasopharyngeal Swab  Result Value Ref Range Status   SARS Coronavirus 2 NEGATIVE NEGATIVE Final    Comment: (NOTE) SARS-CoV-2 target nucleic acids are NOT DETECTED. The SARS-CoV-2 RNA is generally detectable in upper and lower respiratory specimens during the acute phase of infection. The lowest concentration of SARS-CoV-2 viral copies this assay can detect is 250 copies / mL. A negative result does not preclude SARS-CoV-2 infection and should not be used as the sole basis for treatment or other patient management decisions.  A negative result may occur with improper specimen collection / handling, submission of specimen other than nasopharyngeal swab, presence of viral mutation(s) within the areas targeted by this assay, and inadequate number of viral copies (<250 copies / mL). A negative result must be combined with clinical observations, patient history, and epidemiological information. Fact Sheet for Patients:   StrictlyIdeas.no Fact Sheet for Healthcare Providers: BankingDealers.co.za This test is not yet approved or cleared  by the [place] FDA and has been authorized for detection and/or diagnosis of SARS-CoV-2 by FDA under an Emergency Use Authorization (EUA).  This EUA will remain in effect (meaning this test can be used) for the duration of the COVID-19 declaration under Section 564(b)(1) of the Act, 21 U.S.C. section 360bbb-3(b)(1), unless the authorization is terminated or revoked sooner. Performed at [facility], [place] [address] [place],  [address]   Respiratory Panel by PCR     Status: None   Collection Time: 12/26/19  5:28 PM   Specimen: Nasopharyngeal Swab; Respiratory  Result Value Ref Range Status   Adenovirus NOT DETECTED NOT DETECTED Final    Coronavirus 229E NOT DETECTED NOT DETECTED Final    Comment: (NOTE) The Coronavirus on the Respiratory Panel, DOES NOT test for the novel  Coronavirus (2019 nCoV)    Coronavirus HKU1 NOT DETECTED NOT DETECTED Final   Coronavirus NL63 NOT DETECTED NOT DETECTED Final   Coronavirus OC43 NOT DETECTED NOT DETECTED Final   Metapneumovirus NOT DETECTED NOT DETECTED Final   Rhinovirus / Enterovirus NOT DETECTED NOT DETECTED Final   Influenza A NOT DETECTED NOT  DETECTED Final   Influenza B NOT DETECTED NOT DETECTED Final   Parainfluenza Virus 1 NOT DETECTED NOT DETECTED Final   Parainfluenza Virus 2 NOT DETECTED NOT DETECTED Final   Parainfluenza Virus 3 NOT DETECTED NOT DETECTED Final   Parainfluenza Virus 4 NOT DETECTED NOT DETECTED Final   Respiratory Syncytial Virus NOT DETECTED NOT DETECTED Final   Bordetella pertussis NOT DETECTED NOT DETECTED Final   Chlamydophila pneumoniae NOT DETECTED NOT DETECTED Final   Mycoplasma pneumoniae NOT DETECTED NOT DETECTED Final    Comment: Performed at [facility], [address] [place], [place] [address]    Radiology Studies: No results found.    T.  [facility]  If 7PM-7AM, please contact night-coverage [email or web address] Password [email or web address] 12/27/2019, 3:12 PM  

## 2019-12-27 NOTE — Progress Notes (Signed)
    I briefly saw Ms. Gable this morning on her way to MRI.  I have been waiting for the results of the MRI prior to rounding. If not read by 5 PM, will make formal rounds.  [name], MD

## 2019-12-28 DIAGNOSIS — F329 Major depressive disorder, single episode, unspecified: Secondary | ICD-10-CM

## 2019-12-28 DIAGNOSIS — F419 Anxiety disorder, unspecified: Secondary | ICD-10-CM

## 2019-12-28 DIAGNOSIS — M797 Fibromyalgia: Secondary | ICD-10-CM

## 2019-12-28 LAB — COMPREHENSIVE METABOLIC PANEL
ALT: 9 U/L (ref 0–44)
AST: 14 U/L — ABNORMAL LOW (ref 15–41)
Albumin: 3 g/dL — ABNORMAL LOW (ref 3.5–5.0)
Alkaline Phosphatase: 78 U/L (ref 38–126)
Anion gap: 12 (ref 5–15)
BUN: 12 mg/dL (ref 8–23)
CO2: 31 mmol/L (ref 22–32)
Calcium: 9.5 mg/dL (ref 8.9–10.3)
Chloride: 96 mmol/L — ABNORMAL LOW (ref 98–111)
Creatinine, Ser: 0.74 mg/dL (ref 0.44–1.00)
GFR calc Af Amer: >60 mL/min (ref >60)
GFR calc non Af Amer: >60 mL/min (ref >60)
Glucose, Bld: 156 mg/dL — ABNORMAL HIGH (ref 70–99)
Potassium: 3.6 mmol/L (ref 3.5–5.1)
Sodium: 139 mmol/L (ref 135–145)
Total Bilirubin: 0.8 mg/dL (ref 0.3–1.2)
Total Protein: 7 g/dL (ref 6.5–8.1)

## 2019-12-28 LAB — GLUCOSE, CAPILLARY
Glucose-Capillary: 148 mg/dL — ABNORMAL HIGH (ref 70–99)
Glucose-Capillary: 173 mg/dL — ABNORMAL HIGH (ref 70–99)
Glucose-Capillary: 367 mg/dL — ABNORMAL HIGH (ref 70–99)
Glucose-Capillary: 265 mg/dL — ABNORMAL HIGH (ref 70–99)

## 2019-12-28 LAB — MAGNESIUM: Magnesium: 2 mg/dL (ref 1.7–2.4)

## 2019-12-28 MED ORDER — POTASSIUM CHLORIDE CRYS ER 20 MEQ PO TBCR
40.0000 meq | EXTENDED_RELEASE_TABLET | Freq: Two times a day (BID) | ORAL | Status: AC
  Administered 2019-12-28 (×2): 40 meq via ORAL
  Filled 2019-12-28 (×2): qty 2

## 2019-12-28 MED ORDER — ALUM & MAG HYDROXIDE-SIMETH 200-200-20 MG/5ML PO SUSP
30.0000 mL | Freq: Four times a day (QID) | ORAL | Status: DC | PRN
  Administered 2019-12-28: 30 mL via ORAL
  Filled 2019-12-28: qty 30

## 2019-12-28 NOTE — Evaluation (Signed)
Physical Therapy Evaluation Patient Details Name: Katherine [name] MRN: [id number] DOB: 03-12-1956 Today's Date: 12/28/2019   History of Present Illness  64 y.o. female with PMH of CAD s/p CABG, persistent atrial fibrillation, aortic stenosis, ESRD s/p renal transplant, COPD on home O2, and morbid obesity s/p lap band who presented with increasing dyspnea.   Clinical Impression  Pt admitted with above diagnosis. Pt was able to ambulate to bathroom and back to chair. Pt no LOB and states she is at baseline. O2 >90% on4LO2 with actvitiy.  C/o dizziness BP was 157/74.  FAtigued with only walking to bathroom and back and refused to go further. Will follow acutely.  Pt currently with functional limitations due to the deficits listed below (see PT Problem List). Pt will benefit from skilled PT to increase their independence and safety with mobility to allow discharge to the venue listed below.      Follow Up Recommendations Home health PT;Supervision/Assistance - 24 hour    Equipment Recommendations  None recommended by PT    Recommendations for Other Services       Precautions / Restrictions Precautions Precautions: Fall Restrictions Weight Bearing Restrictions: No      Mobility  Bed Mobility Overal bed mobility: Needs Assistance Bed Mobility: Rolling;Sidelying to Sit Rolling: Supervision Sidelying to sit: Min guard       General bed mobility comments: in chair on arrival.  Transfers Overall transfer level: Needs assistance Equipment used: 1 person hand held assist;None Transfers: Sit to/from Stand Sit to Stand: Min guard         General transfer comment: from recliner. min guard for safety. no overt LOB.   Ambulation/Gait Ambulation/Gait assistance: Min guard Gait Distance (Feet): 20 Feet(10 feet x 2) Assistive device: None;1 person hand held assist Gait Pattern/deviations: Step-through pattern;Decreased stride length;Wide base of support   Gait velocity  interpretation: <1.31 ft/sec, indicative of household ambulator General Gait Details: Pt asked to walk into bathroom. Pt furntiure walking vs no UE support. No LOB. States she is at baseline with ambulation,.Pt on 4LO2.  Pt was able to clean herself after using bathroom.  Pt refused to ambulate in halls and was fatigued after going in bathroom and back.   Stairs            Wheelchair Mobility    Modified Rankin (Stroke Patients Only)       Balance Overall balance assessment: Needs assistance         Standing balance support: Bilateral upper extremity supported;Single extremity supported Standing balance-Leahy Scale: Poor Standing balance comment: fair static, fair-poor dynamic                             Pertinent Vitals/Pain Pain Assessment: Faces Faces Pain Scale: Hurts even more Pain Location: chest and back Pain Descriptors / Indicators: Guarding Pain Intervention(s): Limited activity within patient's tolerance;Monitored during session;Repositioned    Home Living Family/patient expects to be discharged to:: Private residence Living Arrangements: Non-relatives/Friends Available Help at Discharge: Family;Available PRN/intermittently;Available 24 hours/day Type of Home: Mobile home Home Access: Stairs to enter Entrance Stairs-Rails: Chemical engineer of Steps: 5 Home Layout: One level Home Equipment: Walker - 4 wheels;Cane - single point      Prior Function Level of Independence: Independent with assistive device(s)         Comments: used rollator in community and furniture walker in her mobile home. Reports 1 falls tripping over bed sheets.  Hand Dominance   Dominant Hand: Right    Extremity/Trunk Assessment   Upper Extremity Assessment Upper Extremity Assessment: Defer to OT evaluation    Lower Extremity Assessment Lower Extremity Assessment: Generalized weakness       Communication   Communication: No  difficulties  Cognition Arousal/Alertness: Awake/alert Behavior During Therapy: WFL for tasks assessed/performed Overall Cognitive Status: Within Functional Limits for tasks assessed                                        General Comments General comments (skin integrity, edema, etc.): On 4L supplemental O2 throughout session, 3L at baseline. VSS    Exercises Other Exercises Other Exercises: discussed general UB/LB and core strengthening exercises   Assessment/Plan    PT Assessment Patient needs continued PT services  PT Problem List Decreased activity tolerance;Decreased balance;Decreased mobility;Decreased knowledge of use of DME;Decreased safety awareness;Decreased knowledge of precautions       PT Treatment Interventions DME instruction;Gait training;Functional mobility training;Stair training;Therapeutic activities;Therapeutic exercise;Balance training;Patient/family education    PT Goals (Current goals can be found in the Care Plan section)  Acute Rehab PT Goals Patient Stated Goal: reduce pain PT Goal Formulation: With patient Time For Goal Achievement: 01/11/20 Potential to Achieve Goals: Good    Frequency Min 3X/week   Barriers to discharge        Co-evaluation               AM-PAC PT "6 Clicks" Mobility  Outcome Measure Help needed turning from your back to your side while in a flat bed without using bedrails?: None Help needed moving from lying on your back to sitting on the side of a flat bed without using bedrails?: None Help needed moving to and from a bed to a chair (including a wheelchair)?: None Help needed standing up from a chair using your arms (e.g., wheelchair or bedside chair)?: A Little Help needed to walk in hospital room?: A Little Help needed climbing 3-5 steps with a railing? : A Little 6 Click Score: 21    End of Session Equipment Utilized During Treatment: Gait belt;Oxygen Activity Tolerance: Patient limited by  fatigue Patient left: in chair;with call bell/phone within reach;with chair alarm set Nurse Communication: Mobility status PT Visit Diagnosis: Muscle weakness (generalized) (M62.81)    Time: 5974-1638 PT Time Calculation (min) (ACUTE ONLY): 13 min   Charges:   PT Evaluation $PT Eval Moderate Complexity: [place] W,PT Acute Rehabilitation Services Pager:  [phone]  Office:  [phone]    Denice Paradise 12/28/2019, 1:28 PM

## 2019-12-28 NOTE — Evaluation (Signed)
Occupational Therapy Evaluation Patient Details Name: Katherine [name] MRN: [id number] DOB: 07-05-1956 Today's Date: 12/28/2019    History of Present Illness 64 y.o. female with PMH of CAD s/p CABG, persistent atrial fibrillation, aortic stenosis, ESRD s/p renal transplant, COPD on home O2, and morbid obesity s/p lap band who presented with increasing dyspnea.    Clinical Impression   Pt admitted with the above diagnoses and presents with below problem list. Pt will benefit from continued acute OT to address the below listed deficits and maximize independence with basic ADLs prior to d/c home. PTA pt was mod I with basic ADLs. Pt presents at min guard level with LB ADLs and at least short distance ambulation utilizing rw. With encouragement, pt agreeable to walk from EOB to sit up in the recliner for a bit. Pt declined walking to bathroom this session, discussed benefit of getting up to BSC/bathroom to toilet (with staff present due to fall risk) today. Educated on benefits of getting OOB and mobilizing and risks of immobility. VSS. Pt on 4L O2 throughout session (3L at baseline).      Follow Up Recommendations  Home health OT    Equipment Recommendations  None recommended by OT    Recommendations for Other Services PT consult     Precautions / Restrictions Precautions Precautions: Fall Restrictions Weight Bearing Restrictions: No      Mobility Bed Mobility Overal bed mobility: Needs Assistance Bed Mobility: Rolling;Sidelying to Sit Rolling: Supervision Sidelying to sit: Min guard       General bed mobility comments: min guard for safety, no physical assist needed  Transfers Overall transfer level: Needs assistance Equipment used: Rolling walker (2 wheeled) Transfers: Sit to/from Stand Sit to Stand: Min guard         General transfer comment: to/from EOB and recliner. min guard for safety. no overt LOB.     Balance Overall balance assessment: Needs assistance          Standing balance support: Bilateral upper extremity supported;Single extremity supported Standing balance-Leahy Scale: Poor Standing balance comment: fair static, fair-poor dynamic                           ADL either performed or assessed with clinical judgement   ADL Overall ADL's : Needs assistance/impaired Eating/Feeding: Set up;Sitting   Grooming: Set up;Sitting   Upper Body Bathing: Set up;Sitting   Lower Body Bathing: Min guard;Sit to/from stand   Upper Body Dressing : Set up;Sitting   Lower Body Dressing: Min guard;Sit to/from stand   Toilet Transfer: Min guard;Ambulation;RW;BSC Toilet Transfer Details (indicate cue type and reason): pt declined ambulating to bathroom, pivotal steps to recliner Toileting- Clothing Manipulation and Hygiene: Min guard;Sit to/from stand   Tub/ Shower Transfer: Technical brewer mobility during ADLs: Min guard;Rolling walker General ADL Comments: Pt agreeable for up to recliner with encouragement. declined mobilizing to bathroom. +1 min guard with rw. reports feeling better once in the recliner.      Vision Baseline Vision/History: Wears glasses       Perception     Praxis      Pertinent Vitals/Pain Pain Assessment: Faces Faces Pain Scale: Hurts even more Pain Location: chest and back Pain Descriptors / Indicators: Guarding Pain Intervention(s): Monitored during session;Limited activity within patient's tolerance;Repositioned     Hand Dominance Right   Extremity/Trunk Assessment Upper Extremity Assessment Upper Extremity Assessment: Generalized weakness   Lower Extremity Assessment Lower Extremity  Assessment: Defer to PT evaluation;Generalized weakness       Communication Communication Communication: No difficulties   Cognition Arousal/Alertness: Awake/alert Behavior During Therapy: WFL for tasks assessed/performed Overall Cognitive Status: Within Functional Limits for tasks  assessed                                     General Comments  On 4L supplemental O2 throughout session, 3L at baseline. At end of session HR 73, O2 100 "100?! I haven't seen that number in a long time!"    Exercises Exercises: Other exercises Other Exercises Other Exercises: discussed general UB/LB and core strengthening exercises   Shoulder Instructions      Home Living Family/patient expects to be discharged to:: Private residence Living Arrangements: Non-relatives/Friends Available Help at Discharge: Family;Available PRN/intermittently;Available 24 hours/day Type of Home: Mobile home Home Access: Stairs to enter Entrance Stairs-Number of Steps: 5 Entrance Stairs-Rails: Left;Right Home Layout: One level     Bathroom Shower/Tub: Tub/shower unit;Other (comment)("garden tub")   Bathroom Toilet: Standard     Home Equipment: Walker - 4 wheels;Cane - single point          Prior Functioning/Environment Level of Independence: Independent with assistive device(s)        Comments: used rollator in community and furniture walker in her mobile home. Reports 1 falls tripping over bed sheets.        OT Problem List: Impaired balance (sitting and/or standing);Decreased activity tolerance;Decreased knowledge of use of DME or AE;Decreased knowledge of precautions;Pain      OT Treatment/Interventions: Self-care/ADL training;Therapeutic exercise;Energy conservation;DME and/or AE instruction;Therapeutic activities;Patient/family education;Balance training    OT Goals(Current goals can be found in the care plan section) Acute Rehab OT Goals Patient Stated Goal: reduce pain OT Goal Formulation: With patient Time For Goal Achievement: 01/11/20 Potential to Achieve Goals: Good ADL Goals Pt Will Perform Grooming: with modified independence;standing Pt Will Perform Lower Body Bathing: with modified independence;sit to/from stand Pt Will Perform Lower Body Dressing:  with modified independence;sit to/from stand Pt Will Transfer to Toilet: with modified independence;ambulating Pt Will Perform Toileting - Clothing Manipulation and hygiene: with modified independence;sit to/from stand  OT Frequency: Min 2X/week   Barriers to D/C:            Co-evaluation              AM-PAC OT "6 Clicks" Daily Activity     Outcome Measure Help from another person eating meals?: None Help from another person taking care of personal grooming?: None Help from another person toileting, which includes using toliet, bedpan, or urinal?: A Little Help from another person bathing (including washing, rinsing, drying)?: A Little Help from another person to put on and taking off regular upper body clothing?: None Help from another person to put on and taking off regular lower body clothing?: A Little 6 Click Score: 21   End of Session Equipment Utilized During Treatment: Rolling walker;Oxygen Nurse Communication: Mobility status  Activity Tolerance: Patient tolerated treatment well;Other (comment)(self-limiting) Patient left: in chair;with call bell/phone within reach;with chair alarm set  OT Visit Diagnosis: Unsteadiness on feet (R26.81);Muscle weakness (generalized) (M62.81);Pain                Time: 7412-8786 OT Time Calculation (min): 24 min Charges:  OT General Charges $OT Visit: 1 Visit OT Evaluation $OT Eval Low Complexity: 1 Low OT Treatments $Self Care/Home Management : 8-22 mins  Tyrone Schimke, OT Acute Rehabilitation Services Pager: [phone] Office: [phone]  Hortencia Pilar 12/28/2019, 11:06 AM

## 2019-12-28 NOTE — TOC Initial Note (Signed)
Transition of Care [facility]) - Initial/Assessment Note    Patient Details  Name: Katherine [name] MRN: [id number] Date of Birth: 09/02/1955  Transition [facility]) CM/SW Contact:    Zenon Mayo, RN Phone Number: 12/28/2019, 5:47 PM  Clinical Narrative:                 Patient is active with [facility] for [facility], [place], will need to add [place], will need to let Butch Penny with [facility] know to add HHOT to services. She states she has transport at [employer] , [facility] [address].   Expected Discharge Plan: [place] Barriers to Discharge: Continued Medical Work up   Patient Goals and CMS Choice Patient states their goals for this hospitalization and ongoing recovery are:: get better      Expected Discharge Plan and Services Expected Discharge Plan: [place]   Discharge Planning Services: CM Consult Post Acute Care Choice: Resumption of Svcs/PTA Provider Living arrangements for the past 2 months: Single Family Home                   DME Agency: NA       HH Arranged: RN, PT, OT [place] Agency: [place] ([place]) Date HH Agency Contacted: 12/28/19 Time HH Agency Contacted: 1746 Representative spoke with at [place]: Butch Penny  Prior Living Arrangements/Services Living arrangements for the past 2 months: [place] Lives with:: Relatives Patient language and need for interpreter reviewed:: Yes Do you feel safe going back to the place where you live?: Yes      Need for Family Participation in Patient Care: Yes (Comment) Care giver support system in place?: Yes (comment) Current home services: DME(home oxygen) Criminal Activity/Legal Involvement Pertinent to Current Situation/Hospitalization: No - Comment as needed  Activities of Daily Living Home Assistive Devices/Equipment: Shower chair with back ADL Screening (condition at time of admission) Patient's cognitive ability adequate to safely complete daily activities?:  Yes Is the patient deaf or have difficulty hearing?: No Does the patient have difficulty seeing, even when wearing glasses/contacts?: No Does the patient have difficulty concentrating, remembering, or making decisions?: No Patient able to express need for assistance with ADLs?: Yes Does the patient have difficulty dressing or bathing?: No Independently performs ADLs?: Yes (appropriate for developmental age) Does the patient have difficulty walking or climbing stairs?: Yes Weakness of Legs: Both Weakness of Arms/Hands: None  Permission Sought/Granted                  Emotional Assessment   Attitude/Demeanor/Rapport: Engaged Affect (typically observed): Appropriate Orientation: : Oriented to  Time, Oriented to Situation, Oriented to Place, Oriented to Self Alcohol / Substance Use: Not Applicable Psych Involvement: No (comment)  Admission diagnosis:  SOB (shortness of breath) [R06.02] COPD exacerbation (HCC) [J44.1] Acute exacerbation of CHF (congestive heart failure) ([place]) [I50.9] Chest pain [R07.9] Patient Active Problem List   Diagnosis Date Noted  . Acute on chronic diastolic heart failure ([place]) 12/26/2019  . Acute exacerbation of CHF (congestive heart failure) ([place]) 12/25/2019  . Chronic bronchitis, mucopurulent ([place]) 08/24/2019  . HCAP (healthcare-associated pneumonia) 08/08/2019  . Current chronic use of systemic steroids 07/23/2019  . OSA (obstructive sleep apnea)   . Severe episode of recurrent major depressive disorder, with psychotic features ([place]) 02/16/2019  . Obstructive chronic bronchitis without exacerbation ([place]) 11/03/2018  . Long term (current) use of anticoagulants 04/28/2018  . Renal transplant, status post 01/28/2018  .  Primary osteoarthritis of right knee 08/18/2017  . Mild aortic stenosis by prior echocardiogram 01/28/2017  . 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 Fibromyalgia 03/30/2016  . Other spondylosis with radiculopathy, lumbar region 03/30/2016  .  Type 2 diabetes mellitus with peripheral neuropathy ([place]) 03/30/2016  . Paroxysmal atrial fibrillation ([place]); CHA2DS2VASc score F, HTN, CAD, CVA = 5 06/28/2014  . Hypertension associated with diabetes ([place])   . PAD (peripheral artery disease) ([place]) 08/17/2013  . Stenosis of right carotid artery without infarction 10/08/2012  . Dyslipidemia, goal LDL below 70 10/08/2012    Class: Diagnosis of  . Mitral annular calcification 10/08/2012  . Healthcare-associated pneumonia 06/12/2012  . Renal transplant disorder 06/12/2012  . Chronic allergic rhinitis 04/29/2011  . Extrinsic asthma 09/09/2007  . GERD 09/09/2007  . COUGH, CHRONIC 09/09/2007  . Morbid obesity - s/p Lap Band 9/'05 05/07/2004    Class: Diagnosis of  . S/P CABG (coronary artery bypass graft) x 3 09/07/1993    Class: History of  . 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 07/1993  . Coronary artery disease involving native coronary artery of native heart with angina pectoris ([place]) 07/1993  . ESRD (end stage renal disease) ([place]) 1991   PCP:  [name], MD Pharm[facility]   [id number] [place], [address] [place] [address] Phone: [phone] Fax: [phone]  [place], [place] - [address] [place] [address] Phone: [phone] Fax: [phone]  [place], [address] [place] [address] Phone: [phone] Fax: [phone]     Social Determinants of Health (SDOH) Interventions    Readmission Risk Interventions Readmission Risk Prevention Plan 12/28/2019 08/13/2019  Transportation Screening Complete Complete  HRI or Home Care Consult Complete -  Social Work Consult for [place] Planning/Counseling Complete -  Palliative Care Screening Not Applicable -  Medication Review Press photographer) Complete Complete   PCP or Specialist appointment within 3-5 days of discharge - Complete  HRI or [place] - Complete  SW Recovery Care/Counseling Consult - Complete  [place] - Not Applicable  Some recent data might be hidden

## 2019-12-28 NOTE — Plan of Care (Signed)
  Problem: Education: Goal: Knowledge of General Education information will improve Description: Including pain rating scale, medication(s)/side effects and non-pharmacologic comfort measures Outcome: Progressing   Problem: Health Behavior/Discharge Planning: Goal: Ability to manage health-related needs will improve Outcome: Progressing   Problem: Clinical Measurements: Goal: Ability to maintain clinical measurements within normal limits will improve Outcome: Progressing   Problem: Pain Managment: Goal: General experience of comfort will improve Outcome: Progressing   Problem: Safety: Goal: Ability to remain free from injury will improve Outcome: Progressing   

## 2019-12-28 NOTE — Progress Notes (Signed)
PT Cancellation Note  Patient Details Name: Katherine [name] MRN: [id number] DOB: Oct 29, 1955   Cancelled Treatment:    Reason Eval/Treat Not Completed: Other (comment)("I am not ready to walk." Will return as able.)   Denice Paradise 12/28/2019, 10:26 AM  Chace Klippel W,PT Acute Rehabilitation Services Pager:  [phone]  Office:  [phone]

## 2019-12-28 NOTE — Progress Notes (Addendum)
Progress Note  Patient Name: Katherine [name] Date of Encounter: 12/28/2019  Primary Cardiologist: [name], MD   Subjective   Since last night she has dull achy chest pressure and back pain. Worsen with deep breath and cough. Breathing stable.   Inpatient Medications    Scheduled Meds: . apixaban  5 mg Oral BID  . arformoterol  15 mcg Nebulization BID  . azaTHIOprine  125 mg Oral QPM  . budesonide  2 mL Nebulization BID  . calcitRIOL  0.25 mcg Oral Q3 days  . clotrimazole  10 mg Oral 5 X Daily  . diltiazem  120 mg Oral Daily  . dofetilide  250 mcg Oral BID  . DULoxetine  60 mg Oral Daily  . fluticasone  2 spray Each Nare Daily  . furosemide  40 mg Oral BID  . insulin aspart  0-9 Units Subcutaneous TID WC  . insulin glargine  12 Units Subcutaneous Daily  . lamoTRIgine  25 mg Oral QHS  . metoprolol tartrate  50 mg Oral BID  . montelukast  10 mg Oral Daily  . pantoprazole  40 mg Oral Daily  . predniSONE  5 mg Oral Daily  . promethazine  25 mg Oral QHS  . rosuvastatin  20 mg Oral Daily  . sertraline  200 mg Oral QHS  . sodium chloride flush  3 mL Intravenous Q12H   Continuous Infusions: . sodium chloride     PRN Meds: sodium chloride, acetaminophen, albuterol, alum & mag hydroxide-simeth, cyclobenzaprine, LORazepam, ondansetron (ZOFRAN) IV, oxyCODONE, sodium chloride flush   Vital Signs    Vitals:   12/28/19 0342 12/28/19 0343 12/28/19 0801 12/28/19 0904  BP: (!) 163/77   (!) 135/59  Pulse: 63   69  Resp: 16     Temp: 98.1 F (36.7 C)     TempSrc: Oral     SpO2: 100%  100%   Weight:  95.6 kg    Height:        Intake/Output Summary (Last 24 hours) at 12/28/2019 0919 Last data filed at 12/28/2019 0908 Gross per 24 hour  Intake 940 ml  Output 2750 ml  Net -1810 ml   Last 3 Weights 12/28/2019 12/27/2019 12/26/2019  Weight (lbs) 210 lb 11.2 oz 210 lb 3.2 oz 211 lb 11.2 oz  Weight (kg) 95.573 kg 95.346 kg 96.026 kg      Telemetry    NSR- Personally  Reviewed  ECG    No now tracing   Physical Exam   GEN: No acute distress.   Neck: No JVD Cardiac: RRR,+ murmurs, rubs, or gallops. TTP at left upper chest and L back  Respiratory: Clear to auscultation bilaterally. GI: Soft, nontender, non-distended  MS: No edema; No deformity. Neuro:  Nonfocal  Psych: Normal affect   Labs    High Sensitivity Troponin:   Recent Labs  Lab 12/25/19 0730 12/25/19 1401  TROPONINIHS 18* 15      Chemistry Recent Labs  Lab 12/26/19 0606 12/27/19 0824 12/28/19 0707  NA 138 137 139  K 3.7 3.8 3.6  CL 99 99 96*  CO2 28 27 31   GLUCOSE 112* 178* 156*  BUN 12 14 12   CREATININE 0.94 0.91 0.74  CALCIUM 9.2 9.4 9.5  PROT  --   --  7.0  ALBUMIN  --   --  3.0*  AST  --   --  14*  ALT  --   --  9  ALKPHOS  --   --  78  BILITOT  --   --  0.8  GFRNONAA >60 >60 >60  GFRAA >60 >60 >60  ANIONGAP 11 11 12      Hematology Recent Labs  Lab 12/25/19 0730 12/26/19 0606 12/27/19 0824  WBC 7.1 4.6 7.4  RBC 2.97* 3.13* 3.23*  HGB 9.8* 10.2* 10.3*  HCT 31.4* 32.6* 32.9*  MCV 105.7* 104.2* 101.9*  MCH 33.0 32.6 31.9  MCHC 31.2 31.3 31.3  RDW 16.5* 16.3* 16.0*  PLT 208 223 271    BNP Recent Labs  Lab 12/25/19 1401  BNP 422.2*      Radiology    MR CARDIAC MORPHOLOGY W WO CONTRAST  Result Date: 12/27/2019 CLINICAL DATA:  64 year old with acute diastolic CHF, CAD, post renal transplant, atrial fibrillation being evaluated for a possible myocarditis. EXAM: CARDIAC MRI TECHNIQUE: The patient was scanned on a 1.5 Tesla GE magnet. A dedicated cardiac coil was used. Functional imaging was done using Fiesta sequences. 2,3, and 4 chamber views were done to assess for RWMA's. Modified Simpson's rule using a short axis stack was used to calculate an ejection fraction on a dedicated work Conservation officer, nature. The patient received 10 cc of Gadavist. After 10 minutes inversion recovery sequences were used to assess for infiltration and scar  tissue. CONTRAST:  10 cc  of Gadavist FINDINGS: 1. Normal left ventricular size, with mild basal septal hypertrophy and normal systolic function (LVEF = 56%). There is hypokinesis of the mid anteroseptal, anterior and akinesis of the apical septal, anterior and inferior walls. There is late gadolinium enhancement in the mid anteroseptal, anterior walls (50-75%) and in the apical septal, anterior and inferior walls (75-100%). LVEDD: 50 mm LVESD: 46 mm LVEDV: 144 ml LVESV: 64 ml SV: 80 ml CO: 4.3 L/min Myocardial mass: 122 g 2. Normal right ventricular size, thickness and systolic function (LVEF = 62%). There are no regional wall motion abnormalities. 3.  Moderately dilated left atrium. Normal right atrial size. 4. Normal size of the aortic root, ascending aorta. Mildly dilated pulmonary artery measuring 31 mm. 5.  Mild mitral and trivial tricuspid regurgitation. 6.  Normal pericardium.  No pericardial effusion. IMPRESSION: 1. Normal left ventricular size, with mild basal septal hypertrophy and normal systolic function (LVEF = 56%). There is hypokinesis of the mid anteroseptal, anterior and akinesis of the apical septal, anterior and inferior walls. There is late gadolinium enhancement in the mid anteroseptal, anterior walls (50-75%) and in the apical septal, anterior and inferior walls (75-100%). 2. Normal right ventricular size, thickness and systolic function (LVEF = 62%). There are no regional wall motion abnormalities. 3.  Moderately dilated left atrium. Normal right atrial size. 4. Normal size of the aortic root, ascending aorta. Mildly dilated pulmonary artery measuring 31 mm. 5.  Mild mitral and trivial tricuspid regurgitation. 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 Overall LVEF is preserved. There is no evidence for an acute myocarditis. Electronically Signed   By: Ena Dawley   On: 12/27/2019 18:02    Cardiac Studies   Echo 12/25/19 1. Left  ventricular ejection fraction, by estimation, is 55 to 60%. The  left ventricle has grossly normal systolic function. Technically difficult  study, unable to assess for regional wall motion abnormalities. There is  moderate left ventricular  hypertrophy. Left ventricular diastolic parameters are consistent with  Grade III diastolic dysfunction (restrictive). Elevated left atrial  pressure.  2. Right ventricle is poorly visualized. Right ventricular systolic  function is  grossly mildly reduced. The right ventricular size is normal.  Tricuspid regurgitation signal is inadequate for assessing PA pressure.  3. Left atrial size was mildly dilated.  4. The mitral valve is abnormal. Moderate mitral annular calcification.  Trivial mitral valve regurgitation.  5. The aortic valve was not well visualized. Aortic valve regurgitation  is not visualized. Mild to moderate aortic valve stenosis (Vmax 2.7 m/s,  MG 15 mmHg, AVA 1.2 cm^2, DI 0.46)  6. Aortic dilatation noted. There is mild dilatation of the ascending  aorta measuring 36 mm.  7. The inferior vena cava is dilated in size with >50% respiratory  variability, suggesting right atrial pressure of 8 mmHg.   Cardiac MRI 12/27/19 IMPRESSION: 1. Normal left ventricular size, with mild basal septal hypertrophy and normal systolic function (LVEF = 56%). There is hypokinesis of the mid anteroseptal, anterior and akinesis of the apical septal, anterior and inferior walls. There is late gadolinium enhancement in the mid anteroseptal, anterior walls (50-75%) and in the apical septal, anterior and inferior walls (75-100%).  2. Normal right ventricular size, thickness and systolic function (LVEF = 62%). There are no regional wall motion abnormalities.  3.  Moderately dilated left atrium. Normal right atrial size.  4. Normal size of the aortic root, ascending aorta. Mildly dilated pulmonary artery measuring 31 mm.  5.  Mild mitral and  trivial tricuspid regurgitation.  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 Overall LVEF is preserved. There is no evidence for an acute myocarditis.  Patient Profile     64 y.o. female with PMH of CAD s/p CABG, persistent atrial fibrillation, aortic stenosis, ESRD s/p renal transplant, COPD on home O2, and morbid obesity s/p lap band who presented with increasing dyspnea.   Assessment & Plan    1. Acute on Chronic diastolic CHF - Echo showed LVEF of 55-60% and garde 3 DD - Cardiac MRI without evidence of myocarditis. 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 - Felt volume overload is related to increased inflammation leading to worsening diastolic function.  She has not had many CHF exacerbations in the 10 years that I have known her. - Now on PO lasix 40mg  BID>> net I & O -2.04L.  - Continue BB  2. Chest and back pain - reproducible with palpitation however felt better after minute or so>> consistent with MSK etiology>> she just got Oxycodone >> see response - Come pleuritic component >> worse with deep breath and cough>> had reassuring cardiac MRI    3. CAD s/p CABG - CP not consistent with angina -similar pain has been previously evaluated with Myoview study been nonischemic. - not on ASA due to need of anticoagulation  4. Persistent atrial fibrillation  - maintaining sinus rhythm on Tikosyn, Cardizem and BB - On Eliquis >no bleeding issue - K 3.6 today >will suppliment    For questions or updates, please contact [place] Please consult [email or web address] for contact info under        Signed, Leanor Kail, PA  12/28/2019, 9:19 AM     ATTENDING ATTESTATION  I have seen, examined and evaluated the patient this morning along with Mr. Curly [place], [place].  After reviewing all the available data and chart, we discussed the patients laboratory, study & physical findings as well as  symptoms in detail. I agree with his findings, examination as well as impression recommendations as per our discussion.    Attending adjustments noted  in italics.   Monda actually looks pretty well today.  She seems to be pretty much euvolemic on exam.  Dyspnea seems to be notably improved.  Still having decent output with oral Lasix.  Recommendation would be to stay on twice daily Lasix 40 mg for the next 2 to 3 days and then reduce to once daily with an additional dose as needed for worsening dyspnea or edema.  Would recommend supplemental potassium-K-Dur 20 mEq with additional dose.  Thankfully, it appears that she does not have myocarditis based on MRI.  At this point she is nearing completion of treatment of her CHF exacerbation.     [facility] will sign off.   Medication Recommendations: See recommendation above in my attestation Other recommendations (labs, testing, etc): Would recommend checking chemistry panel (can be ordered At [facility] office which she is familiar with. Follow up as an outpatient: We will arrange outpatient follow-up with either me or Almyra Deforest, [place]    [name], M.D., M.S. Interventional Cardiologist   Pager # [phone] Phone # [phone] [address] [place], [place] [address]

## 2019-12-28 NOTE — Progress Notes (Signed)
PROGRESS NOTE  Katherine [name] HRC:[id number] DOB: 1955-09-16   PCP: [name], MD  Patient is from: Home  DOA: 12/25/2019 LOS: 3  Brief Narrative / Interim history: 64 year old female history of CAD/CABG, COPD, type II DM, renal transplant on chronic steroids, depression, fibromyalgia, chronic pain, PVD, GERD, OSA not on CPAP after her lap band, CVA, obesity, rheumatoid arthritis.  Patient presented with complaints of malaise and shortness of breath after receiving Pfizer COVID-19 vaccine last Wednesday.  She is now being treated for CHF exacerbation.  Initially, there was concern about vaccine related myocarditis which was ruled out by cardiac MRI.  Subjective: Seen and examined earlier this morning.  Reports some indigestion, upper back pain and generalized weakness.  She denies chest pain or shortness of breath.  Denies GI or UTI symptoms.  Objective: Vitals:   12/28/19 0343 12/28/19 0801 12/28/19 0904 12/28/19 1136  BP:   (!) 135/59 (!) 149/64  Pulse:   69 (!) 50  Resp:    17  Temp:    98.6 F (37 C)  TempSrc:    Oral  SpO2:  100%  96%  Weight: 95.6 kg     Height:        Intake/Output Summary (Last 24 hours) at 12/28/2019 1244 Last data filed at 12/28/2019 0908 Gross per 24 hour  Intake 940 ml  Output 2750 ml  Net -1810 ml   Filed Weights   12/26/19 0021 12/27/19 0540 12/28/19 0343  Weight: 96 kg 95.3 kg 95.6 kg    Examination:  GENERAL: No apparent distress.  Nontoxic. HEENT: MMM.  Vision and hearing grossly intact.  NECK: Supple.  No apparent JVD.  RESP: 100% on 4 L.  No IWOB.  Fair aeration bilaterally. CVS:  RRR. Heart sounds normal.  ABD/GI/GU: BS+. Abd soft, NTND.  MSK/EXT:  Moves extremities. No apparent deformity. No edema.  SKIN: no apparent skin lesion or wound NEURO: Awake, alert and oriented appropriately.  No apparent focal neuro deficit. PSYCH: Calm.  Somewhat flat affect.  Procedures:  None  Microbiology summarized: COVID-19 PCR  negative. Respiratory viral panel negative.  Assessment & Plan: Acute on chronic diastolic CHF: Echo with EF of 55 to 60%, G3DD, mild to mod AS.  Unable to assess RWM or diastolic function.  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  Diuresed well with IV Lasix and transition to p.o. -Cardiology managing-p.o. Lasix 40 mg twice daily  Malaise/low-grade temperature: Could be related to COVID-19 vaccine.  CRP and ESR elevated.  RVP and pro-Cal negative.  CXR without evidence of pneumonia.  Cardiac MRI as above.  No signs of adrenal insufficiency.  She remains afebrile here but with generalized weakness -Encourage her to work with PT/OT  Controlled DM-2 with hyperglycemia: A1c 5.6%.  Hyperglycemia could be due to steroid. Recent Labs    12/27/19 2143 12/28/19 0604 12/28/19 1131  GLUCAP 153* 148* 173*  -Continue Lantus and SSI   Chronic COPD/chronic respiratory failure on 3 L at baseline: Stable. -Continue home low-dose prednisone and breathing treatments -Minimum oxygen to keep saturation above 90%  Paroxysmal A. Fib:  -Continue Tikosyn, Cardizem, metoprolol and Eliquis  Essential hypertension: Normotensive. -Continue home meds  Hypomagnesemia: Resolved -Monitor and replenish  CAD s/p CABG: Endorses chest pain which is very atypical.  Has mildly elevated troponin.  Cardiac MRI as above. -Cardiology following-on metoprolol, statin and aspirin  History of renal transplant. Chronic immunosuppressive therapy. -Continue home regimen  of prednisone and Imuran.  Anxiety/depression/fibromyalgia/chronic pain: Seems to have flat affect but denies SI or HI.  This could be playing a role in her generalized weakness/fatigue -Continue oxycodone, Cymbalta and Zoloft.  GERD. -Continue PPI.  Morbid obesity s/p lap band surgery. Body mass index is 39.81 kg/m. -Encourage lifestyle change to lose weight.  Generalized  weakness/malaise/fatigue -OOB/PT/OT-emphasized the importance of this          DVT prophylaxis: On Eliquis for A. fib Code Status: Full code Family Communication: Patient and/or RN. Available if any question.  Status is: Inpatient  Remains inpatient appropriate because:Inpatient level of care appropriate due to severity of illness and Ongoing fatigue, oxygen requirement higher than baseline and pending therapy evaluation   Dispo: The patient is from: Home              Anticipated d/c is to: Home              Anticipated d/c date is: 1 day              Patient currently is not medically stable to d/c.        Consultants:  Cardiology   Sch Meds:  Scheduled Meds: . apixaban  5 mg Oral BID  . arformoterol  15 mcg Nebulization BID  . azaTHIOprine  125 mg Oral QPM  . budesonide  2 mL Nebulization BID  . calcitRIOL  0.25 mcg Oral Q3 days  . clotrimazole  10 mg Oral 5 X Daily  . diltiazem  120 mg Oral Daily  . dofetilide  250 mcg Oral BID  . DULoxetine  60 mg Oral Daily  . fluticasone  2 spray Each Nare Daily  . furosemide  40 mg Oral BID  . insulin aspart  0-9 Units Subcutaneous TID WC  . insulin glargine  12 Units Subcutaneous Daily  . lamoTRIgine  25 mg Oral QHS  . metoprolol tartrate  50 mg Oral BID  . montelukast  10 mg Oral Daily  . pantoprazole  40 mg Oral Daily  . potassium chloride  40 mEq Oral BID  . predniSONE  5 mg Oral Daily  . promethazine  25 mg Oral QHS  . rosuvastatin  20 mg Oral Daily  . sertraline  200 mg Oral QHS  . sodium chloride flush  3 mL Intravenous Q12H   Continuous Infusions: . sodium chloride     PRN Meds:.sodium chloride, acetaminophen, albuterol, alum & mag hydroxide-simeth, cyclobenzaprine, LORazepam, ondansetron (ZOFRAN) IV, oxyCODONE, sodium chloride flush  Antimicrobials: Anti-infectives (From admission, onward)   None       I have personally reviewed the following labs and images: CBC: Recent Labs  Lab 12/25/19 0730  12/26/19 0606 12/27/19 0824  WBC 7.1 4.6 7.4  NEUTROABS 5.6  --  5.2  HGB 9.8* 10.2* 10.3*  HCT 31.4* 32.6* 32.9*  MCV 105.7* 104.2* 101.9*  PLT 208 223 271   BMP &GFR Recent Labs  Lab 12/25/19 0730 12/26/19 0606 12/27/19 0824 12/28/19 0707  NA 138 138 137 139  K 4.0 3.7 3.8 3.6  CL 104 99 99 96*  CO2 _0 GLUCOSE 164* 112* 178* 156*  BUN _1 CREATININE 0.84 0.94 0.91 0.74  CALCIUM 8.9 9.2 9.4 9.5  MG  --   --  1.5* 2.0   Estimated Creatinine Clearance: 77 mL/min (by C-G formula based on SCr of 0.74 mg/dL). Liver & Pancreas: Recent Labs  Lab 12/28/19 0707  AST 14*  ALT 9  ALKPHOS 78  BILITOT 0.8  PROT 7.0  ALBUMIN 3.0*   No results for input(s): LIPASE, AMYLASE in the last 168 hours. No results for input(s): AMMONIA in the last 168 hours. Diabetic: Recent Labs    12/25/19 1401  HGBA1C 5.6   Recent Labs  Lab 12/27/19 0627 12/27/19 1630 12/27/19 2143 12/28/19 0604 12/28/19 1131  GLUCAP 144* 170* 153* 148* 173*   Cardiac Enzymes: No results for input(s): CKTOTAL, CKMB, CKMBINDEX, TROPONINI in the last 168 hours. No results for input(s): PROBNP in the last 8760 hours. Coagulation Profile: No results for input(s): INR, PROTIME in the last 168 hours. Thyroid Function Tests: No results for input(s): TSH, T4TOTAL, FREET4, T3FREE, THYROIDAB in the last 72 hours. Lipid Profile: No results for input(s): CHOL, HDL, LDLCALC, TRIG, CHOLHDL, LDLDIRECT in the last 72 hours. Anemia Panel: No results for input(s): VITAMINB12, FOLATE, FERRITIN, TIBC, IRON, RETICCTPCT in the last 72 hours. Urine analysis:    Component Value Date/Time   COLORURINE YELLOW 08/08/2019 1722   APPEARANCEUR CLEAR 08/08/2019 1722   LABSPEC 1.011 08/08/2019 1722   PHURINE 9.0 (H) 08/08/2019 1722   GLUCOSEU NEGATIVE 08/08/2019 1722   HGBUR NEGATIVE 08/08/2019 1722   BILIRUBINUR NEGATIVE 08/08/2019 1722   KETONESUR NEGATIVE 08/08/2019 1722   PROTEINUR NEGATIVE 08/08/2019  1722   UROBILINOGEN 0.2 10/16/2013 0110   NITRITE NEGATIVE 08/08/2019 1722   LEUKOCYTESUR NEGATIVE 08/08/2019 1722   Sepsis Labs: Invalid input(s): PROCALCITONIN, [place]  Microbiology: Recent Results (from the past 240 hour(s))  SARS Coronavirus 2 by RT PCR (hospital order, performed in [facility] hospital lab) Nasopharyngeal Nasopharyngeal Swab     Status: None   Collection Time: 12/25/19  8:27 AM   Specimen: Nasopharyngeal Swab  Result Value Ref Range Status   SARS Coronavirus 2 NEGATIVE NEGATIVE Final    Comment: (NOTE) SARS-CoV-2 target nucleic acids are NOT DETECTED. The SARS-CoV-2 RNA is generally detectable in upper and lower respiratory specimens during the acute phase of infection. The lowest concentration of SARS-CoV-2 viral copies this assay can detect is 250 copies / mL. A negative result does not preclude SARS-CoV-2 infection and should not be used as the sole basis for treatment or other patient management decisions.  A negative result may occur with improper specimen collection / handling, submission of specimen other than nasopharyngeal swab, presence of viral mutation(s) within the areas targeted by this assay, and inadequate number of viral copies (<250 copies / mL). A negative result must be combined with clinical observations, patient history, and epidemiological information. Fact Sheet for Patients:   StrictlyIdeas.no Fact Sheet for Healthcare Providers: BankingDealers.co.za This test is not yet approved or cleared  by the [place] FDA and has been authorized for detection and/or diagnosis of SARS-CoV-2 by FDA under an Emergency Use Authorization (EUA).  This EUA will remain in effect (meaning this test can be used) for the duration of the COVID-19 declaration under Section 564(b)(1) of the Act, 21 U.S.C. section 360bbb-3(b)(1), unless the authorization is terminated or revoked sooner. Performed at  [facility], [place] [address] [place], [place] [address]   Respiratory Panel by PCR     Status: None   Collection Time: 12/26/19  5:28 PM   Specimen: Nasopharyngeal Swab; Respiratory  Result Value Ref Range Status   Adenovirus NOT DETECTED NOT DETECTED Final   Coronavirus 229E NOT DETECTED NOT DETECTED Final    Comment: (NOTE) The Coronavirus on the Respiratory Panel, DOES NOT test for the novel  Coronavirus (2019 nCoV)    Coronavirus HKU1 NOT DETECTED NOT DETECTED Final   Coronavirus NL63 NOT DETECTED NOT DETECTED Final   Coronavirus OC43 NOT DETECTED NOT DETECTED Final   Metapneumovirus NOT DETECTED NOT DETECTED Final   Rhinovirus / Enterovirus NOT DETECTED NOT DETECTED Final   Influenza A NOT DETECTED NOT DETECTED Final   Influenza B NOT DETECTED NOT DETECTED Final   Parainfluenza Virus 1 NOT DETECTED NOT DETECTED Final   Parainfluenza Virus 2 NOT DETECTED NOT DETECTED Final   Parainfluenza Virus 3 NOT DETECTED NOT DETECTED Final   Parainfluenza Virus 4 NOT DETECTED NOT DETECTED Final   Respiratory Syncytial Virus NOT DETECTED NOT DETECTED Final   Bordetella pertussis NOT DETECTED NOT DETECTED Final   Chlamydophila pneumoniae NOT DETECTED NOT DETECTED Final   Mycoplasma pneumoniae NOT DETECTED NOT DETECTED Final    Comment: Performed at [facility], [address], [place], [place] [address]    Radiology Studies: MR CARDIAC MORPHOLOGY W WO CONTRAST  Result Date: 12/27/2019 CLINICAL DATA:  64 year old with acute diastolic CHF, CAD, post renal transplant, atrial fibrillation being evaluated for a possible myocarditis. EXAM: CARDIAC MRI TECHNIQUE: The patient was scanned on a 1.5 Tesla GE magnet. A dedicated cardiac coil was used. Functional imaging was done using Fiesta sequences. 2,3, and 4 chamber views were done to assess for RWMA's. Modified Simpson's rule using a short axis stack was used to calculate an ejection fraction on a dedicated work Doctor, hospital. The patient received 10 cc of Gadavist. After 10 minutes inversion recovery sequences were used to assess for infiltration and scar tissue. CONTRAST:  10 cc  of Gadavist FINDINGS: 1. Normal left ventricular size, with mild basal septal hypertrophy and normal systolic function (LVEF = 56%). There is hypokinesis of the mid anteroseptal, anterior and akinesis of the apical septal, anterior and inferior walls. There is late gadolinium enhancement in the mid anteroseptal, anterior walls (50-75%) and in the apical septal, anterior and inferior walls (75-100%). LVEDD: 50 mm LVESD: 46 mm LVEDV: 144 ml LVESV: 64 ml SV: 80 ml CO: 4.3 L/min Myocardial mass: 122 g 2. Normal right ventricular size, thickness and systolic function (LVEF = 62%). There are no regional wall motion abnormalities. 3.  Moderately dilated left atrium. Normal right atrial size. 4. Normal size of the aortic root, ascending aorta. Mildly dilated pulmonary artery measuring 31 mm. 5.  Mild mitral and trivial tricuspid regurgitation. 6.  Normal pericardium.  No pericardial effusion. IMPRESSION: 1. Normal left ventricular size, with mild basal septal hypertrophy and normal systolic function (LVEF = 56%). There is hypokinesis of the mid anteroseptal, anterior and akinesis of the apical septal, anterior and inferior walls. There is late gadolinium enhancement in the mid anteroseptal, anterior walls (50-75%) and in the apical septal, anterior and inferior walls (75-100%). 2. Normal right ventricular size, thickness and systolic function (LVEF = 62%). There are no regional wall motion abnormalities. 3.  Moderately dilated left atrium. Normal right atrial size. 4. Normal size of the aortic root, ascending aorta. Mildly dilated pulmonary artery measuring 31 mm. 5.  Mild mitral and trivial tricuspid regurgitation. 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 Overall LVEF is preserved. There is no evidence for an  acute myocarditis. Electronically Signed   By: Ena Dawley   On: 12/27/2019 18:02     Cartrell Bentsen T. [place]  If 7PM-7AM, please contact night-coverage [email or web address] Password [facility] 12/28/2019, 12:44 PM

## 2019-12-29 ENCOUNTER — Telehealth: Payer: Self-pay | Admitting: Physician Assistant

## 2019-12-29 ENCOUNTER — Telehealth: Payer: Self-pay | Admitting: *Deleted

## 2019-12-29 DIAGNOSIS — J9611 Chronic respiratory failure with hypoxia: Secondary | ICD-10-CM

## 2019-12-29 DIAGNOSIS — G894 Chronic pain syndrome: Secondary | ICD-10-CM

## 2019-12-29 LAB — RENAL FUNCTION PANEL
Albumin: 3.1 g/dL — ABNORMAL LOW (ref 3.5–5.0)
Anion gap: 13 (ref 5–15)
BUN: 13 mg/dL (ref 8–23)
CO2: 29 mmol/L (ref 22–32)
Calcium: 9.5 mg/dL (ref 8.9–10.3)
Chloride: 92 mmol/L — ABNORMAL LOW (ref 98–111)
Creatinine, Ser: 0.76 mg/dL (ref 0.44–1.00)
GFR calc Af Amer: >60 mL/min (ref >60)
GFR calc non Af Amer: >60 mL/min (ref >60)
Glucose, Bld: 141 mg/dL — ABNORMAL HIGH (ref 70–99)
Phosphorus: 3.8 mg/dL (ref 2.5–4.6)
Potassium: 4.1 mmol/L (ref 3.5–5.1)
Sodium: 134 mmol/L — ABNORMAL LOW (ref 135–145)

## 2019-12-29 LAB — CBC
HCT: 33.5 % — ABNORMAL LOW (ref 36.0–46.0)
Hemoglobin: 10.7 g/dL — ABNORMAL LOW (ref 12.0–15.0)
MCH: 32.3 pg (ref 26.0–34.0)
MCHC: 31.9 g/dL (ref 30.0–36.0)
MCV: 101.2 fL — ABNORMAL HIGH (ref 80.0–100.0)
Platelets: 308 10*3/uL (ref 150–400)
RBC: 3.31 MIL/uL — ABNORMAL LOW (ref 3.87–5.11)
RDW: 15.8 % — ABNORMAL HIGH (ref 11.5–15.5)
WBC: 4.8 10*3/uL (ref 4.0–10.5)
nRBC: 0 % (ref 0.0–0.2)

## 2019-12-29 LAB — MAGNESIUM: Magnesium: 1.9 mg/dL (ref 1.7–2.4)

## 2019-12-29 LAB — GLUCOSE, CAPILLARY: Glucose-Capillary: 128 mg/dL — ABNORMAL HIGH (ref 70–99)

## 2019-12-29 MED ORDER — FUROSEMIDE 40 MG PO TABS: ORAL_TABLET | ORAL | 0 refills | Status: DC

## 2019-12-29 MED ORDER — POTASSIUM CHLORIDE ER 20 MEQ PO TBCR: EXTENDED_RELEASE_TABLET | ORAL | 1 refills | Status: DC

## 2019-12-29 NOTE — TOC Transition Note (Signed)
Transition of Care [facility]) - CM/SW Discharge Note   Patient Details  Name: Katherine [name] MRN: [id number] Date of Birth: January 27, 1956  Transition of Care [facility]) CM/SW Contact:  Verdell Carmine, RN Phone Number: 12/29/2019, 10:34 AM   Clinical Narrative:    Patient discharging today, no barriers identified. Home health with [facility], Rep Butch Penny made aware of discharge. NO further needs identified   Final next level of care: [place] Barriers to Discharge: No Barriers Identified   Patient Goals and CMS Choice Patient states their goals for this hospitalization and ongoing recovery are:: get better      Discharge Placement            Home with Home health           Discharge Plan and Services   Discharge Planning Services: CM Consult Post Acute Care Choice: Resumption of Svcs/PTA Provider            DME Agency: NA       HH Arranged: RN, PT, OT [place] Agency: [place] ([facility]) Date HH Agency Contacted: 12/28/19 Time [place]: [place] Representative spoke with at [facility]: [place] ([place]) Interventions     Readmission Risk Interventions Readmission Risk Prevention Plan 12/28/2019 08/13/2019  Transportation Screening Complete Complete  HRI or [place] Complete -  Social Work Consult for [place] Planning/Counseling Complete -  [place] Screening Not Applicable -  Medication Review Press photographer) Complete Complete  PCP or Specialist appointment within 3-5 days of discharge - Complete  HRI or [place] - Complete  SW Recovery Care/Counseling Consult - Complete  [place] - Not Applicable  Some recent data might be hidden

## 2019-12-29 NOTE — Discharge Summary (Signed)
Physician Discharge Summary  Katherine [name] FOY:[id number] DOB: September 06, 1955 DOA: 12/25/2019  PCP: [name], MD  Admit date: 12/25/2019 Discharge date: 12/29/2019  Admitted From: Home Disposition: Home  Recommendations for Outpatient Follow-up:  1. Follow ups as below. 2. Please obtain CBC/BMP/Mag at follow up 3. Please follow up on the following pending results: None  Home Health: PT/OT/RN Equipment/Devices: None recommended  Discharge Condition: Stable CODE STATUS: Full code  Follow-up [place] Follow up.   Why: HHRN, HHPT, HHOT       [name], MD. Schedule an appointment as soon as possible for a visit in 1 week(s).   Specialty: Internal Medicine Contact information: [place] [address] [phone]        [name], MD. Schedule an appointment as soon as possible for a visit in 2 week(s).   Specialty: Cardiology Contact information: [address] [place] [address] [phone]           Hospital Course: 64 year old female history of CAD/CABG, COPD, type II DM, renal transplant on chronic steroids, depression, fibromyalgia, chronic pain, PVD, GERD, OSA not on CPAP after her lap band, CVA, obesity, rheumatoid arthritis. Patient presented with complaints of malaise and shortness of breath after receiving Pfizer COVID-19 vaccine last Wednesday.  She is now being treated for CHF exacerbation.  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   Patient was diuresed with IV Lasix with improvement in his symptoms.  Eventually transition to p.o. Lasix and cleared for discharge by cardiology.  Evaluated by PT/OT who recommended home health PT/OT.  See individual problem list below for more hospital course.  Discharge Diagnoses:  Acute on chronic diastolic CHF: Echo with EF  of 55 to 60%, G3DD, mild to mod AS.  Unable to assess RWM or diastolic function.  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  Diuresed well with IV Lasix and transitioned to p.o. continue to diurese well on p.o. Lasix.  Net -3 L.  Discharge weight 209 pounds (down from 215 pounds).  Discharged on Lasix 40 mg twice daily for 2 more days followed by 40 mg daily plus additional 40 mg as needed for edema, shortness of breath or weight gain.  Prescribed K-Dur 20 mEq to be taken on days she take additional dose of Lasix as recommended by cardiology.  She was also counseled on sodium and fluid restriction, as well as daily weight.  Home health RN ordered on discharge.  Malaise/low-grade temperature: Could be related to COVID-19 vaccine.  CRP and ESR elevated.  RVP and pro-Cal negative.  CXR without evidence of pneumonia.  Cardiac MRI as above.  No signs of adrenal insufficiency.  She remains afebrile here but with generalized weakness.  -PT/OT at home.  Controlled DM-2 with hyperglycemia: A1c 5.6%.  Hyperglycemia could be due to chronic steroid. Recent Labs    12/28/19 1621 12/28/19 2041 12/29/19 0635  GLUCAP 367* 265* 128*  -Discharged on home medications.   Chronic COPD/chronic respiratory failure on 3 L at baseline: Stable. -Continue home low-dose prednisone and breathing treatments  Paroxysmal A. Fib:  -Continue Tikosyn, Cardizem, metoprolol and Eliquis  Essential hypertension: Normotensive. -Continue home meds  Hypomagnesemia: Resolved -Monitor and replenish  CAD s/p CABG: Endorses chest pain which is very atypical.  Has mildly elevated troponin.  Cardiac MRI as above. -Cardiology following-on metoprolol, statin and aspirin  History of renal transplant. Chronic immunosuppressive therapy. -Continue home regimen of prednisone and Imuran.  Anxiety/depression/fibromyalgia/chronic pain: Seems to have flat affect but denies SI or HI.   This could be playing a role in her generalized weakness/fatigue -Continue oxycodone, Cymbalta and Zoloft.  GERD. -Continue PPI.  Morbid obesity s/p lap band surgery. Body mass index is 39.81 kg/m. -Encourage lifestyle change to lose weight.  Generalized weakness/malaise/fatigue -HH PT/OT.                 Discharge Exam: Vitals:   12/28/19 2337 12/29/19 0507  BP:  (!) 130/58  Pulse: 60 60  Resp: 18 18  Temp:  98.1 F (36.7 C)  SpO2: 96% 93%    GENERAL: No apparent distress.  Nontoxic. HEENT: MMM.  Vision and hearing grossly intact.  NECK: Supple.  No apparent JVD.  RESP: 93% on 3 L.  No IWOB.  Fair aeration bilaterally. CVS:  RRR. Heart sounds normal.  ABD/GI/GU: Bowel sounds present. Soft. Non tender.  MSK/EXT:  Moves extremities. No apparent deformity. No edema.  SKIN: no apparent skin lesion or wound NEURO: Awake, alert and oriented appropriately.  No apparent focal neuro deficit. PSYCH: Calm. Normal affect.   Discharge Instructions  Discharge Instructions    (HEART FAILURE PATIENTS) Call MD:  Anytime you have any of the following symptoms: 1) 3 pound weight gain in 24 hours or 5 pounds in 1 week 2) shortness of breath, with or without a dry hacking cough 3) swelling in the hands, feet or stomach 4) if you have to sleep on extra pillows at night in order to breathe.   Complete by: As directed    Call MD for:  difficulty breathing, headache or visual disturbances   Complete by: As directed    Call MD for:  extreme fatigue   Complete by: As directed    Call MD for:  persistant dizziness or light-headedness   Complete by: As directed    Diet - low sodium heart healthy   Complete by: As directed    Diet Carb Modified   Complete by: As directed    Discharge instructions   Complete by: As directed    It has been a pleasure taking care of you! You were hospitalized with shortness of breath, malaise and generalized weakness likely due to CHF exacerbation  and possible reaction to COVID-19 vaccine.  You were treated with Lasix and your breathing improved.  We are discharging you on Lasix tablets to continue at home. Please review your new medication list and the directions before you take your medications.  In addition to new medications, it is very important that you avoid alcohol beverages or over-the-counter pain medication other than plain Tylenol, limit the amount of water/fluid you drink to less than 6 cups (1500 cc) a day,  limit your sodium (salt) intake to less than 2 g (2000 mg) a day, and weigh yourself daily at the same time and keeping your weight log.   Take care,   Increase activity slowly   Complete by: As directed      Allergies as of 12/29/2019      Reactions   Tetracycline Hives   Patient tolerated Doxycycline Dec 2020   Hydromorphone Hcl Nausea And Vomiting   Niacin Other (See Comments)   Mouth blisters   Niaspan [niacin Er] Other (See Comments)   Mouth blisters   Sulfa  Antibiotics Nausea Only, Other (See Comments)   "Tears up stomach"   Sulfonamide Derivatives Other (See Comments)   Reaction: per patient "tears her stomach up"   Codeine Nausea And Vomiting   Erythromycin Nausea And Vomiting   Hydromorphone Hcl Nausea And Vomiting   Morphine And Related Nausea And Vomiting   Nalbuphine Nausea And Vomiting   Sulfasalazine Nausea Only, Other (See Comments)   per patient "tears her stomach up", "Tears up stomach"   Tape Rash, Other (See Comments)   No "plastic" tape," please      Medication List    STOP taking these medications   guaiFENesin 600 MG 12 hr tablet Commonly known as: MUCINEX     TAKE these medications   albuterol (2.5 MG/3ML) 0.083% nebulizer solution Commonly known as: PROVENTIL Take 3 mLs (2.5 mg total) by nebulization every 6 (six) hours as needed for wheezing or shortness of breath (dx: J45.20). What changed: Another medication with the same name was changed. Make sure you understand how and  when to take each.   Ventolin HFA 108 (90 Base) MCG/ACT inhaler Generic drug: albuterol TAKE 2 PUFFS BY MOUTH EVERY 6 HOURS AS NEEDED FOR WHEEZE OR SHORTNESS OF BREATH What changed: See the new instructions.   apixaban 5 MG Tabs tablet Commonly known as: Eliquis Take 1 tablet (5 mg total) by mouth 2 (two) times daily.   arformoterol 15 MCG/2ML Nebu Commonly known as: BROVANA Take 2 mLs (15 mcg total) by nebulization 2 (two) times daily.   azaTHIOprine 50 MG tablet Commonly known as: IMURAN Take 125 mg by mouth every evening. Taking 2 & 1/2 tabs (117m) qd   B-D ULTRAFINE III SHORT PEN 31G X 8 MM Misc Generic drug: Insulin Pen Needle See admin instructions.   Basaglar KwikPen 100 UNIT/ML Inject 12 Units into the skin daily.   budesonide 0.5 MG/2ML nebulizer solution Commonly known as: PULMICORT Take 2 mLs (0.5 mg total) by nebulization 2 (two) times daily.   calcitRIOL 0.25 MCG capsule Commonly known as: ROCALTROL Take 0.25 mcg by mouth every 3 (three) days.   clotrimazole 10 MG troche Commonly known as: MYCELEX Take 1 tablet (10 mg total) by mouth 5 (five) times daily.   cyclobenzaprine 10 MG tablet Commonly known as: FLEXERIL TAKE 1 TABLET BY MOUTH 3 TIMES DAILY AS NEEDED FOR MUSCLE SPASMS What changed: See the new instructions.   diltiazem 120 MG 24 hr capsule Commonly known as: CARDIZEM CD TAKE 1 CAPSULE BY MOUTH EVERY DAY What changed: how much to take   dofetilide 250 MCG capsule Commonly known as: TIKOSYN TAKE 1 CAPSULE BY MOUTH TWICE A DAY What changed:   how much to take  how to take this  when to take this   DULoxetine 30 MG capsule Commonly known as: CYMBALTA TAKE 2 CAPSULES (60 MG TOTAL) BY MOUTH EVERY MORNING. What changed: See the new instructions.   ferrous sulfate 325 (65 FE) MG tablet Take 1 tablet (325 mg total) by mouth daily with breakfast.   fluticasone 50 MCG/ACT nasal spray Commonly known as: FLONASE PLACE 2 SPRAYS INTO BOTH  NOSTRILS 2 TIMES DAILY What changed: See the new instructions.   furosemide 40 MG tablet Commonly known as: LASIX Take 1 tablet (40 mg total) by mouth 2 (two) times daily for 2 days, THEN 1 tablet (40 mg total) daily. May take additional 1 tablet for edema, SOB or weight gain. Start taking on: Dec 29, 2019   lamoTRIgine 25 MG tablet Commonly  known as: LaMICtal Take 1 tablet (25 mg total) by mouth at bedtime.   LORazepam 1 MG tablet Commonly known as: ATIVAN Take 1 mg by mouth daily. Takes daily   metFORMIN 500 MG tablet Commonly known as: GLUCOPHAGE Take 500 mg by mouth 2 (two) times daily.   metoprolol tartrate 50 MG tablet Commonly known as: LOPRESSOR Take 1 tablet (50 mg total) by mouth 2 (two) times daily. What changed: Another medication with the same name was removed. Continue taking this medication, and follow the directions you see here.   montelukast 10 MG tablet Commonly known as: SINGULAIR TAKE 1 TABLET BY MOUTH EVERY DAY   nitroGLYCERIN 0.4 MG SL tablet Commonly known as: NITROSTAT Place 1 tablet (0.4 mg total) under the tongue every 5 (five) minutes as needed for chest pain.   NovoLOG FlexPen 100 UNIT/ML FlexPen Generic drug: insulin aspart Inject 4 Units into the skin 2 (two) times daily after a meal.   omeprazole 20 MG capsule Commonly known as: PRILOSEC Take 20 mg by mouth daily.   OneTouch Ultra test strip Generic drug: glucose blood USE AS DIRECTED 3 TIMES DAILY TO CHECK BLOOD SUGAR   Oxycodone HCl 10 MG Tabs Take 1 tablet (10 mg total) by mouth every 8 (eight) hours as needed. What changed:   when to take this  additional instructions   Potassium Chloride ER 20 MEQ Tbcr Take one tablet on the days you take additional dose of lasix   predniSONE 5 MG tablet Commonly known as: DELTASONE Take 5 mg by mouth daily.   promethazine 25 MG tablet Commonly known as: PHENERGAN Take 25 mg by mouth at bedtime. Or for sleep   rosuvastatin 20 MG  tablet Commonly known as: CRESTOR Take 1 tablet (20 mg total) by mouth daily.   sertraline 100 MG tablet Commonly known as: ZOLOFT Take 200 mg by mouth at bedtime.   sertraline 50 MG tablet Commonly known as: ZOLOFT TAKE 1 TABLET BY MOUTH EVERY DAY IN THE MORNING       Consultations:  Cardiology  Procedures/Studies:  2D Echo on 12/25/2019 1. Left ventricular ejection fraction, by estimation, is 55 to 60%. The  left ventricle has grossly normal systolic function. Technically difficult  study, unable to assess for regional wall motion abnormalities. There is  moderate left ventricular  hypertrophy. Left ventricular diastolic parameters are consistent with  Grade III diastolic dysfunction (restrictive). Elevated left atrial  pressure.  2. Right ventricle is poorly visualized. Right ventricular systolic  function is grossly mildly reduced. The right ventricular size is normal.  Tricuspid regurgitation signal is inadequate for assessing PA pressure.  3. Left atrial size was mildly dilated.  4. The mitral valve is abnormal. Moderate mitral annular calcification.  Trivial mitral valve regurgitation.  5. The aortic valve was not well visualized. Aortic valve regurgitation  is not visualized. Mild to moderate aortic valve stenosis (Vmax 2.7 m/s,  MG 15 mmHg, AVA 1.2 cm^2, DI 0.46)  6. Aortic dilatation noted. There is mild dilatation of the ascending  aorta measuring 36 mm.  7. The inferior vena cava is dilated in size with >50% respiratory  variability, suggesting right atrial pressure of 8 mmHg.    DG Chest Port 1 View  Result Date: 12/25/2019 CLINICAL DATA:  Shortness of breath, chest pain EXAM: PORTABLE CHEST 1 VIEW COMPARISON:  08/08/2019 FINDINGS: Prior CABG. Cardiomegaly. Vascular congestion and interstitial prominence may reflect early interstitial edema. No effusions or acute bony abnormality. IMPRESSION: Cardiomegaly with vascular  congestion and possible early  interstitial edema. Electronically Signed   By: Rolm Baptise M.D.   On: 12/25/2019 09:20   MR CARDIAC MORPHOLOGY W WO CONTRAST  Result Date: 12/27/2019 CLINICAL DATA:  64 year old with acute diastolic CHF, CAD, post renal transplant, atrial fibrillation being evaluated for a possible myocarditis. EXAM: CARDIAC MRI TECHNIQUE: The patient was scanned on a 1.5 Tesla GE magnet. A dedicated cardiac coil was used. Functional imaging was done using Fiesta sequences. 2,3, and 4 chamber views were done to assess for RWMA's. Modified Simpson's rule using a short axis stack was used to calculate an ejection fraction on a dedicated work Conservation officer, nature. The patient received 10 cc of Gadavist. After 10 minutes inversion recovery sequences were used to assess for infiltration and scar tissue. CONTRAST:  10 cc  of Gadavist FINDINGS: 1. Normal left ventricular size, with mild basal septal hypertrophy and normal systolic function (LVEF = 56%). There is hypokinesis of the mid anteroseptal, anterior and akinesis of the apical septal, anterior and inferior walls. There is late gadolinium enhancement in the mid anteroseptal, anterior walls (50-75%) and in the apical septal, anterior and inferior walls (75-100%). LVEDD: 50 mm LVESD: 46 mm LVEDV: 144 ml LVESV: 64 ml SV: 80 ml CO: 4.3 L/min Myocardial mass: 122 g 2. Normal right ventricular size, thickness and systolic function (LVEF = 62%). There are no regional wall motion abnormalities. 3.  Moderately dilated left atrium. Normal right atrial size. 4. Normal size of the aortic root, ascending aorta. Mildly dilated pulmonary artery measuring 31 mm. 5.  Mild mitral and trivial tricuspid regurgitation. 6.  Normal pericardium.  No pericardial effusion. IMPRESSION: 1. Normal left ventricular size, with mild basal septal hypertrophy and normal systolic function (LVEF = 56%). There is hypokinesis of the mid anteroseptal, anterior and akinesis of the apical septal, anterior and  inferior walls. There is late gadolinium enhancement in the mid anteroseptal, anterior walls (50-75%) and in the apical septal, anterior and inferior walls (75-100%). 2. Normal right ventricular size, thickness and systolic function (LVEF = 62%). There are no regional wall motion abnormalities. 3.  Moderately dilated left atrium. Normal right atrial size. 4. Normal size of the aortic root, ascending aorta. Mildly dilated pulmonary artery measuring 31 mm. 5.  Mild mitral and trivial tricuspid regurgitation. 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 Overall LVEF is preserved. There is no evidence for an acute myocarditis. Electronically Signed   By: Ena Dawley   On: 12/27/2019 18:02   ECHOCARDIOGRAM COMPLETE  Result Date: 12/25/2019    ECHOCARDIOGRAM REPORT   Patient Name:   Katherine [name] Date of Exam: 12/25/2019 Medical Rec #:  [id number]          Height:       61.0 in Accession #:    [id number]         Weight:       215.0 lb Date of Birth:  07/29/1956          BSA:          1.948 m Patient Age:    9 years           BP:           126/55 mmHg Patient Gender: F                  HR:           61 bpm. Exam Location:  Inpatient  Procedure: 2D Echo, Cardiac Doppler and Color Doppler Indications:     W25.85 Chronic systolic (congestive) heart failure  History:         Patient has prior history of Echocardiogram examinations, most                  recent 07/24/2019. CAD, Prior CABG, COPD, PAD, Carotid Disease                  and Stroke, Arrythmias:Atrial Fibrillation; Risk                  Factors:Hypertension, Diabetes, Dyslipidemia and Sleep Apnea.                  ESRD.  Sonographer:     Jonelle Sidle Dance Referring Phys:  [id number] [place] Diagnosing Phys: Oswaldo Milian MD IMPRESSIONS  1. Left ventricular ejection fraction, by estimation, is 55 to 60%. The left ventricle has grossly normal systolic function. Technically difficult study, unable to assess  for regional wall motion abnormalities. There is moderate left ventricular hypertrophy. Left ventricular diastolic parameters are consistent with Grade III diastolic dysfunction (restrictive). Elevated left atrial pressure.  2. Right ventricle is poorly visualized. Right ventricular systolic function is grossly mildly reduced. The right ventricular size is normal. Tricuspid regurgitation signal is inadequate for assessing PA pressure.  3. Left atrial size was mildly dilated.  4. The mitral valve is abnormal. Moderate mitral annular calcification. Trivial mitral valve regurgitation.  5. The aortic valve was not well visualized. Aortic valve regurgitation is not visualized. Mild to moderate aortic valve stenosis (Vmax 2.7 m/s, MG 15 mmHg, AVA 1.2 cm^2, DI 0.46)  6. Aortic dilatation noted. There is mild dilatation of the ascending aorta measuring 36 mm.  7. The inferior vena cava is dilated in size with >50% respiratory variability, suggesting right atrial pressure of 8 mmHg. FINDINGS  Left Ventricle: Left ventricular ejection fraction, by estimation, is 55 to 60%. The left ventricle has normal function. The left ventricle has no regional wall motion abnormalities. The left ventricular internal cavity size was normal in size. There is  moderate left ventricular hypertrophy. Left ventricular diastolic parameters are consistent with Grade III diastolic dysfunction (restrictive). Elevated left atrial pressure. Right Ventricle: The right ventricular size is normal. Right vetricular wall thickness was not assessed. Right ventricular systolic function is mildly reduced. Tricuspid regurgitation signal is inadequate for assessing PA pressure. Left Atrium: Left atrial size was mildly dilated. Right Atrium: Right atrial size was normal in size. Pericardium: Trivial pericardial effusion is present. Presence of pericardial fat pad. Mitral Valve: The mitral valve is abnormal. Moderate mitral annular calcification. Trivial mitral  valve regurgitation. Tricuspid Valve: The tricuspid valve is normal in structure. Tricuspid valve regurgitation is not demonstrated. Aortic Valve: The aortic valve was not well visualized. Aortic valve regurgitation is not visualized. Mild to moderate aortic stenosis is present. Aortic valve mean gradient measures 14.0 mmHg. Aortic valve peak gradient measures 28.6 mmHg. Aortic valve area, by VTI measures 1.19 cm. Pulmonic Valve: The pulmonic valve was not well visualized. Pulmonic valve regurgitation is not visualized. Aorta: Aortic dilatation noted. There is mild dilatation of the ascending aorta measuring 36 mm. Venous: The inferior vena cava is dilated in size with greater than 50% respiratory variability, suggesting right atrial pressure of 8 mmHg. IAS/Shunts: The interatrial septum was not well visualized.  LEFT VENTRICLE PLAX 2D LVIDd:         3.90 cm  Diastology LVIDs:  2.70 cm  LV e' lateral:   6.96 cm/s LV PW:         1.50 cm  LV E/e' lateral: 24.6 LV IVS:        1.10 cm  LV e' medial:    4.90 cm/s LVOT diam:     1.80 cm  LV E/e' medial:  34.9 LV SV:         74 LV SV Index:   38 LVOT Area:     2.54 cm  RIGHT VENTRICLE            IVC RV Basal diam:  2.30 cm    IVC diam: 2.30 cm RV S prime:     8.38 cm/s TAPSE (M-mode): 1.7 cm LEFT ATRIUM             Index       RIGHT ATRIUM           Index LA diam:        4.30 cm 2.21 cm/m  RA Area:     14.20 cm LA Vol (A2C):   68.2 ml 35.02 ml/m RA Volume:   31.20 ml  16.02 ml/m LA Vol (A4C):   77.4 ml 39.74 ml/m LA Biplane Vol: 73.6 ml 37.79 ml/m  AORTIC VALVE AV Area (Vmax):    1.11 cm AV Area (Vmean):   1.17 cm AV Area (VTI):     1.19 cm AV Vmax:           267.50 cm/s AV Vmean:          174.000 cm/s AV VTI:            0.618 m AV Peak Grad:      28.6 mmHg AV Mean Grad:      14.0 mmHg LVOT Vmax:         117.00 cm/s LVOT Vmean:        80.300 cm/s LVOT VTI:          0.290 m LVOT/AV VTI ratio: 0.47  AORTA Ao Root diam: 3.00 cm Ao Asc diam:  3.60 cm MITRAL  VALVE MV Area (PHT): 2.00 cm     SHUNTS MV Decel Time: 380 msec     Systemic VTI:  0.29 m MV E velocity: 171.00 cm/s  Systemic Diam: 1.80 cm MV A velocity: 60.90 cm/s MV E/A ratio:  2.81 Oswaldo Milian MD Electronically signed by Oswaldo Milian MD Signature Date/Time: 12/25/2019/6:45:36 PM    Final (Updated)         The results of significant diagnostics from this hospitalization (including imaging, microbiology, ancillary and laboratory) are listed below for reference.     Microbiology: Recent Results (from the past 240 hour(s))  SARS Coronavirus 2 by RT PCR (hospital order, performed in [facility] hospital lab) Nasopharyngeal Nasopharyngeal Swab     Status: None   Collection Time: 12/25/19  8:27 AM   Specimen: Nasopharyngeal Swab  Result Value Ref Range Status   SARS Coronavirus 2 NEGATIVE NEGATIVE Final    Comment: (NOTE) SARS-CoV-2 target nucleic acids are NOT DETECTED. The SARS-CoV-2 RNA is generally detectable in upper and lower respiratory specimens during the acute phase of infection. The lowest concentration of SARS-CoV-2 viral copies this assay can detect is 250 copies / mL. A negative result does not preclude SARS-CoV-2 infection and should not be used as the sole basis for treatment or other patient management decisions.  A negative result may occur with improper specimen collection / handling, submission of specimen other  than nasopharyngeal swab, presence of viral mutation(s) within the areas targeted by this assay, and inadequate number of viral copies (<250 copies / mL). A negative result must be combined with clinical observations, patient history, and epidemiological information. Fact Sheet for Patients:   StrictlyIdeas.no Fact Sheet for Healthcare Providers: BankingDealers.co.za This test is not yet approved or cleared  by the [place] FDA and has been authorized for detection and/or diagnosis of  SARS-CoV-2 by FDA under an Emergency Use Authorization (EUA).  This EUA will remain in effect (meaning this test can be used) for the duration of the COVID-19 declaration under Section 564(b)(1) of the Act, 21 U.S.C. section 360bbb-3(b)(1), unless the authorization is terminated or revoked sooner. Performed at [facility], [place] [address] [place], [place] [address]   Respiratory Panel by PCR     Status: None   Collection Time: 12/26/19  5:28 PM   Specimen: Nasopharyngeal Swab; Respiratory  Result Value Ref Range Status   Adenovirus NOT DETECTED NOT DETECTED Final   Coronavirus 229E NOT DETECTED NOT DETECTED Final    Comment: (NOTE) The Coronavirus on the Respiratory Panel, DOES NOT test for the novel  Coronavirus (2019 nCoV)    Coronavirus HKU1 NOT DETECTED NOT DETECTED Final   Coronavirus NL63 NOT DETECTED NOT DETECTED Final   Coronavirus OC43 NOT DETECTED NOT DETECTED Final   Metapneumovirus NOT DETECTED NOT DETECTED Final   Rhinovirus / Enterovirus NOT DETECTED NOT DETECTED Final   Influenza A NOT DETECTED NOT DETECTED Final   Influenza B NOT DETECTED NOT DETECTED Final   Parainfluenza Virus 1 NOT DETECTED NOT DETECTED Final   Parainfluenza Virus 2 NOT DETECTED NOT DETECTED Final   Parainfluenza Virus 3 NOT DETECTED NOT DETECTED Final   Parainfluenza Virus 4 NOT DETECTED NOT DETECTED Final   Respiratory Syncytial Virus NOT DETECTED NOT DETECTED Final   Bordetella pertussis NOT DETECTED NOT DETECTED Final   Chlamydophila pneumoniae NOT DETECTED NOT DETECTED Final   Mycoplasma pneumoniae NOT DETECTED NOT DETECTED Final    Comment: Performed at [facility], [place]. [address], [place], [place] [address]     Labs: BNP (last 3 results) Recent Labs    07/23/19 1830 12/25/19 1401  BNP 585.6* 098.1*   Basic Metabolic Panel: Recent Labs  Lab 12/25/19 0730 12/26/19 0606 12/27/19 0824 12/28/19 0707 12/29/19 0635  NA 138 138 137 139 134*  K 4.0 3.7 3.8 3.6 4.1    CL 104 99 99 96* 92*  CO2 _0 GLUCOSE 164* 112* 178* 156* 141*  BUN _1 CREATININE 0.84 0.94 0.91 0.74 0.76  CALCIUM 8.9 9.2 9.4 9.5 9.5  MG  --   --  1.5* 2.0 1.9  PHOS  --   --   --   --  3.8   Liver Function Tests: Recent Labs  Lab 12/28/19 0707 12/29/19 0635  AST 14*  --   ALT 9  --   ALKPHOS 78  --   BILITOT 0.8  --   PROT 7.0  --   ALBUMIN 3.0* 3.1*   No results for input(s): LIPASE, AMYLASE in the last 168 hours. No results for input(s): AMMONIA in the last 168 hours. CBC: Recent Labs  Lab 12/25/19 0730 12/26/19 0606 12/27/19 0824 12/29/19 0635  WBC 7.1 4.6 7.4 4.8  NEUTROABS 5.6  --  5.2  --   HGB 9.8* 10.2* 10.3* 10.7*  HCT 31.4* 32.6* 32.9* 33.5*  MCV 105.7* 104.2* 101.9* 101.2*  PLT 208 223 271 308   Cardiac Enzymes: No results for input(s): CKTOTAL, CKMB, CKMBINDEX, TROPONINI in the last 168 hours. BNP: Invalid input(s): POCBNP CBG: Recent Labs  Lab 12/28/19 0604 12/28/19 1131 12/28/19 1621 12/28/19 2041 12/29/19 0635  GLUCAP 148* 173* 367* 265* 128*   D-Dimer No results for input(s): DDIMER in the last 72 hours. Hgb A1c No results for input(s): HGBA1C in the last 72 hours. Lipid Profile No results for input(s): CHOL, HDL, LDLCALC, TRIG, CHOLHDL, LDLDIRECT in the last 72 hours. Thyroid function studies No results for input(s): TSH, T4TOTAL, T3FREE, THYROIDAB in the last 72 hours.  Invalid input(s): FREET3 Anemia work up No results for input(s): VITAMINB12, FOLATE, FERRITIN, TIBC, IRON, RETICCTPCT in the last 72 hours. Urinalysis    Component Value Date/Time   COLORURINE YELLOW 08/08/2019 1722   APPEARANCEUR CLEAR 08/08/2019 1722   LABSPEC 1.011 08/08/2019 1722   PHURINE 9.0 (H) 08/08/2019 1722   GLUCOSEU NEGATIVE 08/08/2019 1722   HGBUR NEGATIVE 08/08/2019 1722   BILIRUBINUR NEGATIVE 08/08/2019 1722   KETONESUR NEGATIVE 08/08/2019 1722   PROTEINUR NEGATIVE 08/08/2019 1722   UROBILINOGEN 0.2 10/16/2013 0110    NITRITE NEGATIVE 08/08/2019 1722   LEUKOCYTESUR NEGATIVE 08/08/2019 1722   Sepsis Labs Invalid input(s): PROCALCITONIN,  WBC,  LACTICIDVEN   Time coordinating discharge: 40 minutes  SIGNED:  Mercy Riding, MD  [facility] 12/29/2019, 8:22 AM  If 7PM-7AM, please contact night-coverage [email or web address] Password [email or web address]

## 2019-12-29 NOTE — Telephone Encounter (Signed)
error 

## 2019-12-29 NOTE — Telephone Encounter (Signed)
Magdaline called for a refill on her oxycodone. She thought there was another rx at the pharmacy but they do not have one.  Her last fill per PMP was 12/02/19

## 2019-12-30 ENCOUNTER — Other Ambulatory Visit: Payer: Self-pay | Admitting: Physical Medicine and Rehabilitation

## 2019-12-30 DIAGNOSIS — I110 Hypertensive heart disease with heart failure: Secondary | ICD-10-CM | POA: Diagnosis not present

## 2019-12-30 DIAGNOSIS — M797 Fibromyalgia: Secondary | ICD-10-CM

## 2019-12-30 DIAGNOSIS — I2510 Atherosclerotic heart disease of native coronary artery without angina pectoris: Secondary | ICD-10-CM | POA: Diagnosis not present

## 2019-12-30 DIAGNOSIS — J449 Chronic obstructive pulmonary disease, unspecified: Secondary | ICD-10-CM | POA: Diagnosis not present

## 2019-12-30 DIAGNOSIS — I5032 Chronic diastolic (congestive) heart failure: Secondary | ICD-10-CM | POA: Diagnosis not present

## 2019-12-30 DIAGNOSIS — I0981 Rheumatic heart failure: Secondary | ICD-10-CM | POA: Diagnosis not present

## 2019-12-30 DIAGNOSIS — I083 Combined rheumatic disorders of mitral, aortic and tricuspid valves: Secondary | ICD-10-CM | POA: Diagnosis not present

## 2019-12-30 MED ORDER — OXYCODONE HCL 10 MG PO TABS: 10.0000 mg | ORAL_TABLET | Freq: Three times a day (TID) | ORAL | 0 refills | Status: DC | PRN

## 2019-12-30 NOTE — Telephone Encounter (Signed)
Sent!

## 2020-01-01 ENCOUNTER — Telehealth: Payer: Self-pay | Admitting: *Deleted

## 2020-01-01 NOTE — Telephone Encounter (Signed)
Pt was on TCM report admitted 12/25/19 w/ malaise and shortness of breath after receiving PfizerCOVID-19vaccine last Wednesday. She is now being treated for CHF exacerbation.Patient was diuresed with IV Lasix with improvement in her symptoms.  She was transition to p.o. Lasix and cleared for discharge by cardiology. Pt D/C 01/23/20, and will follow-up w/cardiologist 01/23/20.[name] KitchenJohny Chess

## 2020-01-02 ENCOUNTER — Telehealth: Payer: Self-pay

## 2020-01-02 DIAGNOSIS — I083 Combined rheumatic disorders of mitral, aortic and tricuspid valves: Secondary | ICD-10-CM | POA: Diagnosis not present

## 2020-01-02 DIAGNOSIS — I0981 Rheumatic heart failure: Secondary | ICD-10-CM | POA: Diagnosis not present

## 2020-01-02 DIAGNOSIS — I2510 Atherosclerotic heart disease of native coronary artery without angina pectoris: Secondary | ICD-10-CM | POA: Diagnosis not present

## 2020-01-02 DIAGNOSIS — I5032 Chronic diastolic (congestive) heart failure: Secondary | ICD-10-CM | POA: Diagnosis not present

## 2020-01-02 DIAGNOSIS — I110 Hypertensive heart disease with heart failure: Secondary | ICD-10-CM | POA: Diagnosis not present

## 2020-01-02 DIAGNOSIS — J449 Chronic obstructive pulmonary disease, unspecified: Secondary | ICD-10-CM | POA: Diagnosis not present

## 2020-01-02 IMAGING — DX CHEST - 2 VIEW
2 series · 2 of 2 positions shown · non-contrast
Comparison: Chest x-ray dated [DATE] 2828

CLINICAL DATA: Weakness.  Shortness of breath.

EXAM:
CHEST - 2 VIEW

[chest pa]
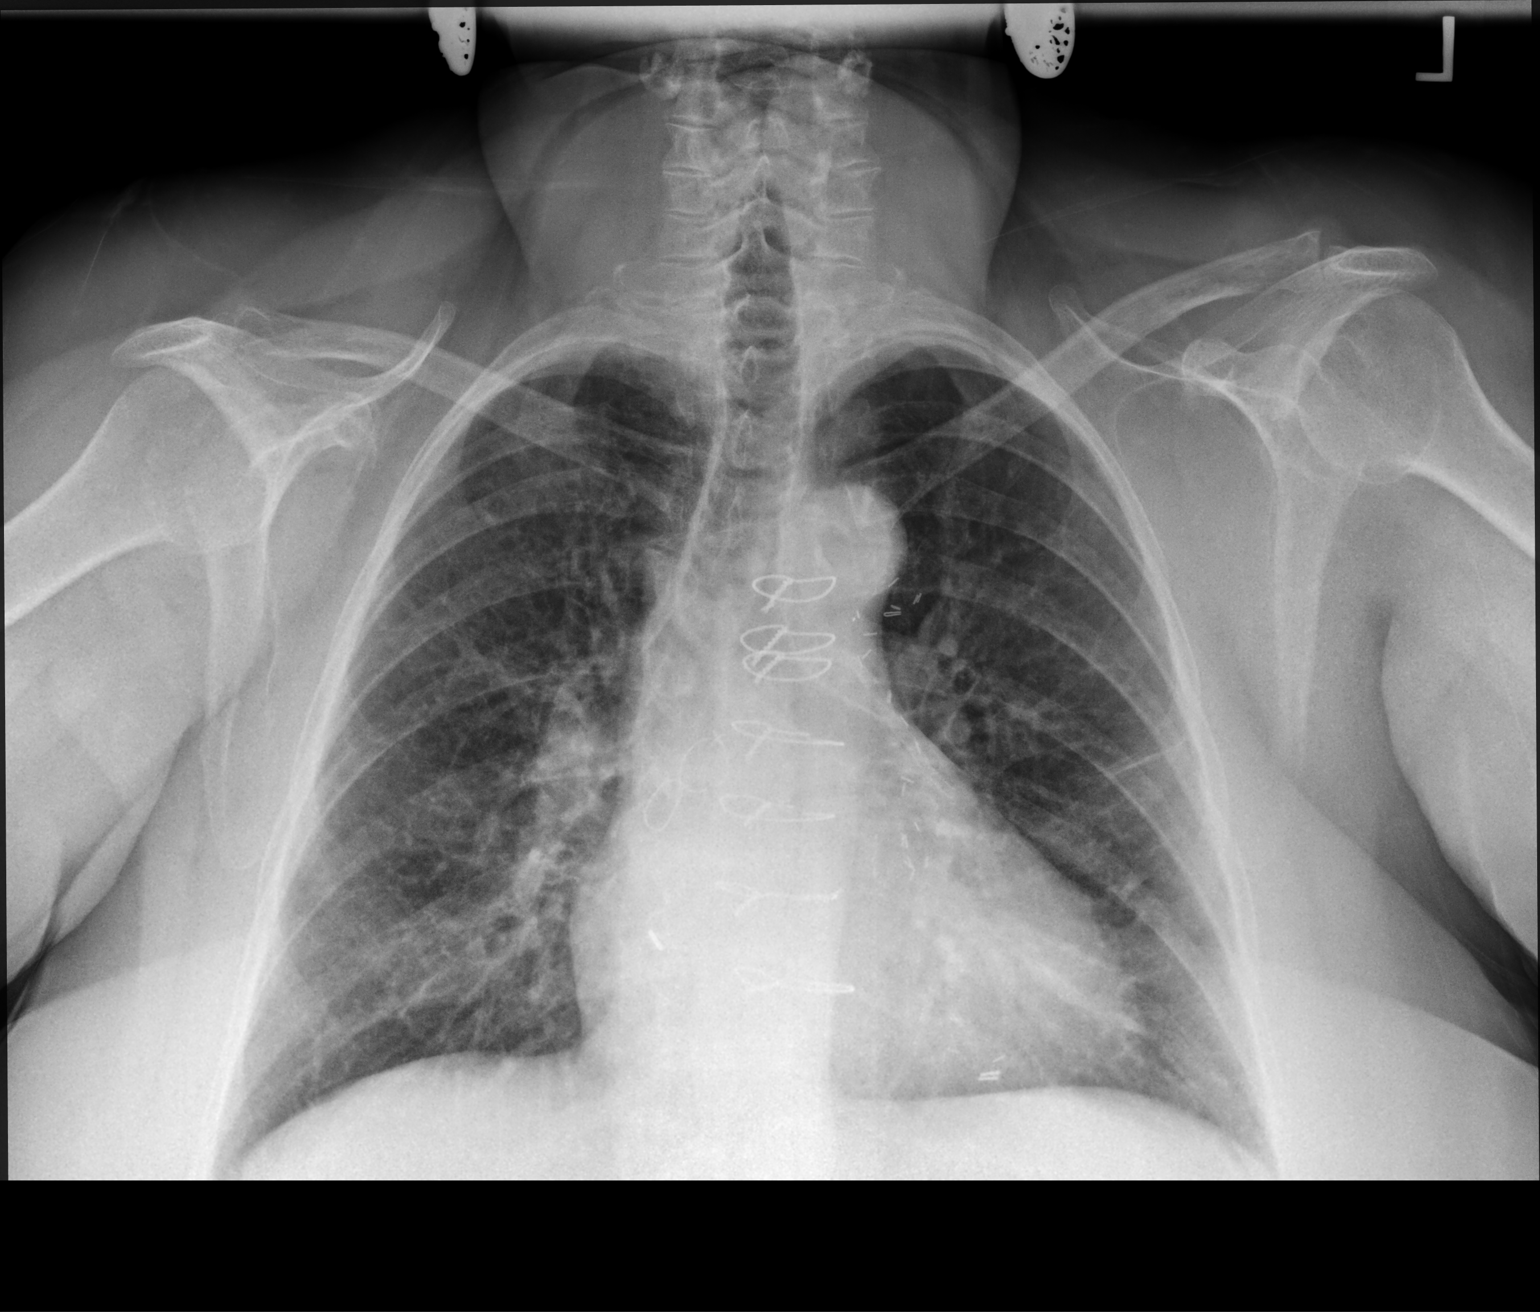

[chest lat]
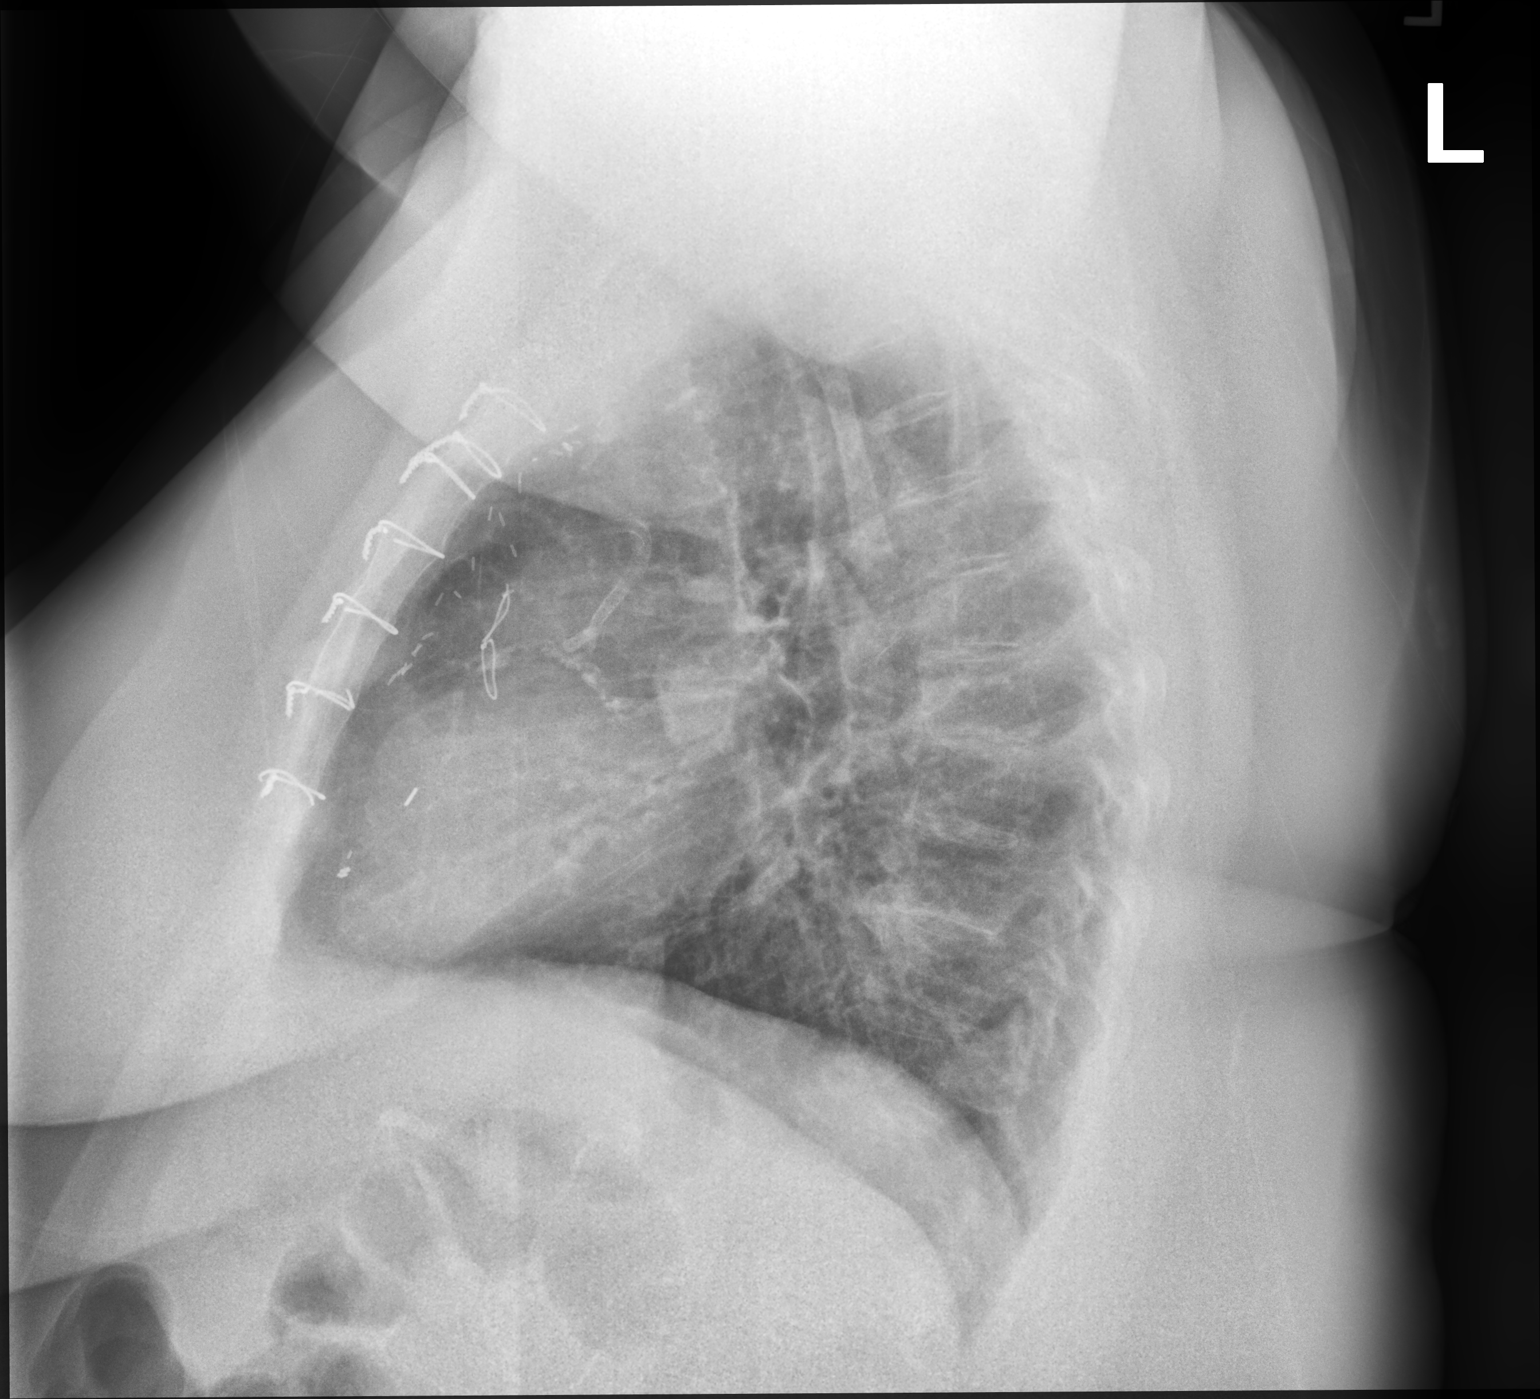

[2 of 2 positions shown; findings below may reference images not displayed]

FINDINGS: The patient is status post prior CABG. There is no pneumothorax. No
large pleural effusion. There are scattered linear opacities
bilaterally which are favored to represent pleuroparenchymal
scarring. There appears to be a background of emphysematous changes.
IMPRESSION: No active cardiopulmonary disease.

## 2020-01-02 NOTE — Telephone Encounter (Signed)
New message    [facility] was seen in the home over the weekend.   The patient requested only one physical therapy session over the weekend and the case is closed out.  Nursing will continue to come out and see the patient

## 2020-01-02 NOTE — Telephone Encounter (Signed)
Notified. 

## 2020-01-03 ENCOUNTER — Telehealth: Payer: Self-pay | Admitting: Cardiology

## 2020-01-03 NOTE — Telephone Encounter (Signed)
Received call from patient she stated she woke up around 8:00 am with chest pain.Stated felt like someone pushing on center of chest.Stated pain comes and goes.She has had 3 episodes.Each episode lasted appox 2 min with pain in left arm.No chest pain at present.Stated she only wants to see Dr.Harding.Stated she was discharged from hospital last Friday from a reaction to first covid vaccine.Stated she was thinking Lasix that she started taking might be causing chest to cramp.Advised if chest pain returns she needs to go to [facility] ED.Advised I will send message to [place] and his RN.

## 2020-01-03 NOTE — Telephone Encounter (Signed)
Pt c/o of Chest Pain: STAT if CP now or developed within 24 hours  1. Are you having CP right now? Pain in the center of her chest.   2. Are you experiencing any other symptoms (ex. SOB, nausea, vomiting, sweating)? HR 56 and Oxygen is 97. Patient states it feels like someone is pushing a hammer into center of her chest. Left arm has pain that is coming and going. Denies other symptoms.  3. How long have you been experiencing CP? Around 8am today  4. Is your CP continuous or coming and going? Coming and going  5. Have you taken Nitroglycerin? No.  ?

## 2020-01-04 ENCOUNTER — Other Ambulatory Visit: Payer: Self-pay | Admitting: *Deleted

## 2020-01-04 ENCOUNTER — Encounter: Payer: Self-pay | Admitting: *Deleted

## 2020-01-04 NOTE — Telephone Encounter (Signed)
She has been noting similar Sx for years.  Has been thoroughly evaluated.    Not likely related to Lasix.    More likely MSK.    Unfortunately - w/ my schedule as is - I do not think I have any openings to see her - unless I have a cancellation.   [name], MD

## 2020-01-04 NOTE — Patient Outreach (Signed)
[facility]) Care Management [facility] CM Telephone Outreach, EMMI Red-Alert notification/ General Discharge PCP office completes Transition of Care follow up post-hospital discharge Post-hospital discharge day # 6  01/04/2020  Katherine [name] 11-14-1955 [id number]  EMMI Red-Alert notification/ General Discharge EMMI call date/ day #: Wednesday Jan 03, 2020; day # 4 Red-Alert reason(s): "sad/ hopeless/ anxious/ empty;" "other problems"  Successful telephone outreach to Katherine [name], 64 y/o female referred to [facility] RN CM this morning by [facility] CMA for EMMI Red-Alert notification as above; patient was recently hospitalized May 10-14, 2021 for shortness of breath/ malaise/ CHF exacerbation that occurred after she received her corona virus vaccine.  She was discharged home to self-care with ongoing home health services for RN/ PT/ OT.  Patient has history including, but not limited to, HTN/ HLD; PAD; paroxysmal Atrial Fibrillation; MI with CABG x 3; DM II; renal transplant; fibromyalgia; CHF/ OSA; morbid obesity with previous lap-banding surgery; and CKD.  HIPAA/ identity verified and purpose of call and [facility] CM services were discussed with patient who provides verbal consent to complete EMMI screening call and eventually provides verbal consent for [facility] CM involvement in her care post-hospital discharge.  Today, patient reports "all going fine," although she admits being fatigued post- recent hospital discharge.  She reports ongoing chronic pain "all over," stating she "is always in pain," and she declines to quantify pain level, stating that "it's the same as it always is."  She denies new/ recent falls and sounds to be in no distress throughout call today.  All items of EMMI Red-Alert notification were discussed with patient who states that she does not remember the EMMI automated call; she reports has had bipolar depression for many years which is well controlled.  She denies new/  worsening sings/ symptoms depression and denies SI.  Patient states she "has no idea" what "other problems" EMMI call was referring to; states she "has no other problems."  EMMI screening completed and patient was engaged with [facility] CM services for ongoing follow up.  Patient further reports:  Medications: -- Has all medicationsand takes as prescribed;denies questions/ concerns around current medications -- self-manage medications; takes directly out of prescription bottles; does not use pill planner box. -- denies issues with swallowing medications -- declines medications review stating she just sat down and does not feel like getting up; we reviewed all medication changes/ new prescriptions post-hospital discharge and patient verbalizes a good understanding of same:  She reports she was not provided an explanation of why the hospital doctor decreased her regular dose of metoprolol to 50 mg BID, and stated that she independently decided to take the dose she was taking prior to being hospitalized: 100 mg po BID.  I explained to patient that the dose may have been decreased related to newly prescribed diuretic, which can lower blood pressure; patient verbalizes understanding and stated she would start taking as prescribed post-hospital discharge until she attends upcoming scheduled cardiology provider visit  Home health [facility]) services: -- Home health services for PT, OT, RN in place through [place] -- confirms that [facility] services were already active prior to this hospitalization and have been extended/ renewed; has had visit from home health team post-recent hospitalization; confirms that she has contact information for home health agency; encouraged her ongoing active participation in all disciplines  Provider appointments: -- All upcoming scheduled provider appointments were reviewed with patient today -- patient was encouraged to make prompt post-hospital discharge office visit, as  instructed  post-hospital discharge; patient stated she "would consider" as she already has scheduled office visit with cardiology provider and is not sure she "needs" to see both doctors; encouraged her to schedule an appointment for medication review, reported weight loss, and also to discuss her ongoing/ established depression   Safety/ Mobility/ Falls: -- denies new/ recent falls -- assistive devices: does not regularly use; occasionally uses cane when outside of home; uses mechanical scooters when available 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 -- general fall risks/ prevention education discussed with patient today  Holiday representative needs: -- currently denies community resource needs, stating supportive family members (niece) that is able to assist with care needs if/ as indicated; lives with a female room mate that occasionally helps her with shopping/ house maintenance etc -- describes self as independent in all ADL's and iADL's; still drives -- SDOH completed for depression/ transportation/ food insecurity: no concerns identified  Advanced Directive (AD) Planning:   --reports does not currently have exisisting AD in place for HCPOA/ living will; basics of Advanced Directive planning discussed with patient today and confirmed I will place educational material/ planning packet in mail to her  Self-health management of multiple chronic conditions including CHF/ chronic pain/ DM/ depression: -- reports has diabetes but "is under control;" denies care coordination needs in managing DM -- manages chronic pain under care of pain clinic; denies care coordination needs in managing chronic pain -- depression "stable;" does not wish to be followed by psychiatric provider as she "already has too many doctors;" discussed with patient value of regular depression screening and follow up, even if this is with her PCP -- monitors/ records daily weights "for decades now;" verbalizes a good general  understanding of the purpose/ rationale for daily weight monitoring and weight gain guidelines/ CHF yellow zone action plan; reports general weight ranges consistently between "207-210lbs " with a weight this morning of "208 lbs." -- reports has lost 30-plus lbs since December, without trying to lose weight; reports she had remote lap-band bariatric surgery; again encouraged her to make PCP office visit to discuss -- is on home O2, currently at 3 L/min; hopeful that she will eventually be able to decrease back to 2 L/min, which is her baseline -- denies issues/ concerns around breathing status, states "much better" post-hospital discharge; patient fatigued post-hospital discharge  Plan:  Patient will take medications as prescribed and will attend all scheduled provider appointments  Patient will promptly schedule post-hospital discharge office visit with PCP  Patient will promptly notify care providers for any new concerns/ issues/ problems that arise  Patient will actively participate in home health services as ordered post-hospital discharge  Patient will continue monitoring/ recording daily weights   I will place educational material around Advanced Directive planning in mail to patient  I will make patient's PCP aware of [place] RN CM involvement in patient's care-- will send barriers letter  [facility] CM outreach to continue with scheduled phone call in 2 weeks   [facility] CM Care Plan Problem One     Most Recent Value  Care Plan Problem One  High risk for hospital readmission related to/ as evidenced by recent hospitalization for CHF exacerbation after receiving corona virus vaccine  Role Documenting the Problem One  Care Management Coordinator  Care Plan for Problem One  Active  THN Long Term Goal   Over the next 31 days, patient will not experience unplanned hospital readmission as evidenced by patient reporting and review of EHR during [facility]  RN CM outreach  THN Long Term Goal Start  Date  01/04/20  Interventions for Problem One Long Term Goal  Completed [facility] CM EMMI red alert notification call,  successfully engaged patient into [facility] CM program,  reviewed with patient post-hospital discharge instructions and medication changes post- hospital discharge,  initiated [facility] CM initial assessment and confirmed that patient has no current clinical or medication concerns  THN CM Short Term Goal #1   Over the next 30 days, patient will continue to actively participate in home health services as ordered post-hospital discharge as evidenced by patient reporting and collaboration with home health team as indicated during [facility] RN CM outreach  [facility] CM Short Term Goal #1 Start Date  01/04/20  Interventions for Short Term Goal #1  Confirmed that patient has ongoing home health services in place and has contact information for home health team,  encouraged her ongoing active participation in all disciplines and discussed differences between home health and THN CM services  THN CM Short Term Goal #2   Over the next 14 days, patient will schedule post-hospital discharge appointment with her PCP as evidenced by patient reporting and collaboration with PCP office team as indicated during [facility] RN CM outreach  [facility] CM Short Term Goal #2 Start Date  01/04/20  Interventions for Short Term Goal #2  Discussed with patient upcoming scheduled appointments and confirmed that she has reliable transportation to all scheduled provider office visits,  discussed value of scheduling prompt PCP visit post-hospital discharge,  discussed talking points to cover with PCP once appointment is scheduled     Oneta Rack, RN, BSN, [place] Coordinator [facility] Care Management  [phone]

## 2020-01-10 DIAGNOSIS — I2510 Atherosclerotic heart disease of native coronary artery without angina pectoris: Secondary | ICD-10-CM | POA: Diagnosis not present

## 2020-01-10 DIAGNOSIS — I083 Combined rheumatic disorders of mitral, aortic and tricuspid valves: Secondary | ICD-10-CM | POA: Diagnosis not present

## 2020-01-10 DIAGNOSIS — I5032 Chronic diastolic (congestive) heart failure: Secondary | ICD-10-CM | POA: Diagnosis not present

## 2020-01-10 DIAGNOSIS — I0981 Rheumatic heart failure: Secondary | ICD-10-CM | POA: Diagnosis not present

## 2020-01-10 DIAGNOSIS — J449 Chronic obstructive pulmonary disease, unspecified: Secondary | ICD-10-CM | POA: Diagnosis not present

## 2020-01-10 DIAGNOSIS — I110 Hypertensive heart disease with heart failure: Secondary | ICD-10-CM | POA: Diagnosis not present

## 2020-01-12 DIAGNOSIS — Z9981 Dependence on supplemental oxygen: Secondary | ICD-10-CM | POA: Diagnosis not present

## 2020-01-12 DIAGNOSIS — M797 Fibromyalgia: Secondary | ICD-10-CM | POA: Diagnosis not present

## 2020-01-12 DIAGNOSIS — E782 Mixed hyperlipidemia: Secondary | ICD-10-CM | POA: Diagnosis not present

## 2020-01-12 DIAGNOSIS — I480 Paroxysmal atrial fibrillation: Secondary | ICD-10-CM | POA: Diagnosis not present

## 2020-01-12 DIAGNOSIS — Z7901 Long term (current) use of anticoagulants: Secondary | ICD-10-CM | POA: Diagnosis not present

## 2020-01-12 DIAGNOSIS — I110 Hypertensive heart disease with heart failure: Secondary | ICD-10-CM | POA: Diagnosis not present

## 2020-01-12 DIAGNOSIS — D649 Anemia, unspecified: Secondary | ICD-10-CM | POA: Diagnosis not present

## 2020-01-12 DIAGNOSIS — E1151 Type 2 diabetes mellitus with diabetic peripheral angiopathy without gangrene: Secondary | ICD-10-CM | POA: Diagnosis not present

## 2020-01-12 DIAGNOSIS — G4733 Obstructive sleep apnea (adult) (pediatric): Secondary | ICD-10-CM | POA: Diagnosis not present

## 2020-01-12 DIAGNOSIS — I2510 Atherosclerotic heart disease of native coronary artery without angina pectoris: Secondary | ICD-10-CM | POA: Diagnosis not present

## 2020-01-12 DIAGNOSIS — Z6839 Body mass index (BMI) 39.0-39.9, adult: Secondary | ICD-10-CM | POA: Diagnosis not present

## 2020-01-12 DIAGNOSIS — I083 Combined rheumatic disorders of mitral, aortic and tricuspid valves: Secondary | ICD-10-CM | POA: Diagnosis not present

## 2020-01-12 DIAGNOSIS — Z7952 Long term (current) use of systemic steroids: Secondary | ICD-10-CM | POA: Diagnosis not present

## 2020-01-12 DIAGNOSIS — Z951 Presence of aortocoronary bypass graft: Secondary | ICD-10-CM | POA: Diagnosis not present

## 2020-01-12 DIAGNOSIS — Z79899 Other long term (current) drug therapy: Secondary | ICD-10-CM | POA: Diagnosis not present

## 2020-01-12 DIAGNOSIS — I5043 Acute on chronic combined systolic (congestive) and diastolic (congestive) heart failure: Secondary | ICD-10-CM | POA: Diagnosis not present

## 2020-01-12 DIAGNOSIS — Z794 Long term (current) use of insulin: Secondary | ICD-10-CM | POA: Diagnosis not present

## 2020-01-12 DIAGNOSIS — M069 Rheumatoid arthritis, unspecified: Secondary | ICD-10-CM | POA: Diagnosis not present

## 2020-01-12 DIAGNOSIS — K219 Gastro-esophageal reflux disease without esophagitis: Secondary | ICD-10-CM | POA: Diagnosis not present

## 2020-01-12 DIAGNOSIS — Z8673 Personal history of transient ischemic attack (TIA), and cerebral infarction without residual deficits: Secondary | ICD-10-CM | POA: Diagnosis not present

## 2020-01-12 DIAGNOSIS — Z940 Kidney transplant status: Secondary | ICD-10-CM | POA: Diagnosis not present

## 2020-01-12 DIAGNOSIS — I0981 Rheumatic heart failure: Secondary | ICD-10-CM | POA: Diagnosis not present

## 2020-01-12 DIAGNOSIS — F418 Other specified anxiety disorders: Secondary | ICD-10-CM | POA: Diagnosis not present

## 2020-01-12 DIAGNOSIS — J449 Chronic obstructive pulmonary disease, unspecified: Secondary | ICD-10-CM | POA: Diagnosis not present

## 2020-01-17 ENCOUNTER — Other Ambulatory Visit: Payer: Self-pay | Admitting: *Deleted

## 2020-01-17 ENCOUNTER — Encounter: Payer: Self-pay | Admitting: *Deleted

## 2020-01-17 NOTE — Patient Outreach (Signed)
[name]) Care Management [place] PCP office completes Transition of Care follow up post-hospital discharge Post-hospital discharge day # 19 without unplanned hospital re-admission  01/17/2020  Katherine [name] Sep 24, 1955 [id number]  Successful telephone outreach to Katherine [name], 64 y/o female referred to [facility] RN CM 01/04/20 by [facility] CMA for Katherine [name] notification; patient was recently hospitalized May 10-14, 2021 for shortness of breath/ malaise/ CHF exacerbation that occurred after she received her corona virus vaccine.  She was discharged home to self-care with ongoing home health services for RN/ PT/ OT.  Patient has history including, but not limited to, HTN/ HLD; PAD; paroxysmal Atrial Fibrillation; MI with CABG x 3; DM II; renal transplant; fibromyalgia; CHF/ OSA; morbid obesity with previous lap-banding surgery; and CKD.  HIPAA/ identity verified; patient reports that she was waiting on me to arrive at her home for scheduled visit; reiterated role of [facility] RN CM and re-explained difference between home health services and clarified that [facility] CM does not routinely complete patient home visits.  Patient reports that I "called at a really bad time," and states that she is unable to talk for very long and unable to complete medication review as she is currently in the bathroom.  She confirms today that she has not yet scheduled post-hospital PCP office visit, and she was again encouraged to do this, as she mentions to me that she "has felt chilled" over the least few days but is not running a fever. She confirms that home health team continues active in her care.  Reports has several care provider office visits to attend next week.  Patient sounds to be in no distress throughout call today.  Patient denies further issues, concerns, or problems today and we eventually agreed upon re-scheduling call for next week to hopefully complete medication review and [facility]  CM initial assessment.  I confirmed that patient has my direct phone number, the main [facility] CM office phone number, and the [facility] CM 24-hour nurse advice phone number should issues arise prior to next scheduled [facility] CM outreach.  Encouraged patient to contact me directly if needs, questions, issues, or concerns arise prior to next scheduled outreach; patient agreed to do so.  Plan:  Patient will take medications as prescribed and will attend all scheduled provider appointments  Patient will promptly schedule post-hospital discharge office visit with PCP  Patient will promptly notify care providers for any new concerns/ issues/ problems that arise  Patient will actively participate in home health services as ordered post-hospital discharge  Patient will continue monitoring/ recording daily weights   [facility] CM outreach to continue with scheduled phone call next week, post-scheduled provider office visits  [facility] CM Care Plan Problem One     Most Recent Value  Care Plan Problem One  High risk for hospital readmission related to/ as evidenced by recent hospitalization for CHF exacerbation after receiving corona virus vaccine  Role Documenting the Problem One  Care Management Coordinator  Care Plan for Problem One  Active  THN Long Term Goal   Over the next 31 days, patient will not experience unplanned hospital readmission as evidenced by patient reporting and review of EHR during [facility] RN CM outreach  [facility] Long Term Goal Start Date  01/04/20  Interventions for Problem One Long Term Goal  [facility] CM call placed to patient,  [facility] CM services and role of THN RN CM reiterated with patient,  discussed with patient her current clinical condition and re-scheduled today's  call per patient request  [facility] CM Short Term Goal #1   Over the next 30 days, patient will continue to actively participate in home health services as ordered post-hospital discharge as evidenced by patient reporting and collaboration with home  health team as indicated during [facility] RN CM outreach  [facility] CM Short Term Goal #1 Start Date  01/04/20  Interventions for Short Term Goal #1  Confirmed that patient continues actively participating in established home health services,  again explained difference between [facility] CM and home health services  THN CM Short Term Goal #2   Over the next 7 days, patient will schedule post-hospital discharge appointment with her PCP as evidenced by patient reporting and collaboration with PCP office team as indicated during [facility] RN CM outreach [goal re-established/ extended]  [facility] CM Short Term Goal #2 Start Date  01/17/20 [Goal re-established and extended today]  Interventions for Short Term Goal #2  Confirmed that patient has not scheduled post-hospital PCP office visit and encouraged her to do so promptly,  confirmed that she has contact information for PCP and continues to provide her own transportation     Oneta Rack, Therapist, sports, Copywriter, advertising, [place] Care Management  [phone]

## 2020-01-20 ENCOUNTER — Other Ambulatory Visit (HOSPITAL_COMMUNITY): Payer: Medicare Other

## 2020-01-21 ENCOUNTER — Other Ambulatory Visit: Payer: Self-pay | Admitting: Emergency Medicine

## 2020-01-22 ENCOUNTER — Other Ambulatory Visit: Payer: Self-pay

## 2020-01-22 ENCOUNTER — Encounter: Payer: Self-pay | Admitting: Registered Nurse

## 2020-01-22 ENCOUNTER — Encounter: Payer: Medicare Other | Attending: Physical Medicine & Rehabilitation | Admitting: Registered Nurse

## 2020-01-22 ENCOUNTER — Other Ambulatory Visit: Payer: Self-pay | Admitting: Physical Medicine & Rehabilitation

## 2020-01-22 VITALS — BP 146/97 | HR 67 | Temp 95.1°F | Ht 61.0 in | Wt 214.6 lb

## 2020-01-22 DIAGNOSIS — M1711 Unilateral primary osteoarthritis, right knee: Secondary | ICD-10-CM

## 2020-01-22 DIAGNOSIS — M7062 Trochanteric bursitis, left hip: Secondary | ICD-10-CM

## 2020-01-22 DIAGNOSIS — M7061 Trochanteric bursitis, right hip: Secondary | ICD-10-CM | POA: Diagnosis not present

## 2020-01-22 DIAGNOSIS — G8929 Other chronic pain: Secondary | ICD-10-CM

## 2020-01-22 DIAGNOSIS — G894 Chronic pain syndrome: Secondary | ICD-10-CM | POA: Diagnosis not present

## 2020-01-22 DIAGNOSIS — M47816 Spondylosis without myelopathy or radiculopathy, lumbar region: Secondary | ICD-10-CM

## 2020-01-22 DIAGNOSIS — M1712 Unilateral primary osteoarthritis, left knee: Secondary | ICD-10-CM | POA: Diagnosis not present

## 2020-01-22 DIAGNOSIS — M25511 Pain in right shoulder: Secondary | ICD-10-CM

## 2020-01-22 DIAGNOSIS — M797 Fibromyalgia: Secondary | ICD-10-CM | POA: Diagnosis not present

## 2020-01-22 DIAGNOSIS — M25512 Pain in left shoulder: Secondary | ICD-10-CM | POA: Diagnosis present

## 2020-01-22 DIAGNOSIS — M2550 Pain in unspecified joint: Secondary | ICD-10-CM | POA: Diagnosis not present

## 2020-01-22 DIAGNOSIS — M5416 Radiculopathy, lumbar region: Secondary | ICD-10-CM

## 2020-01-22 DIAGNOSIS — Z79891 Long term (current) use of opiate analgesic: Secondary | ICD-10-CM | POA: Diagnosis not present

## 2020-01-22 DIAGNOSIS — I6521 Occlusion and stenosis of right carotid artery: Secondary | ICD-10-CM

## 2020-01-22 DIAGNOSIS — Z5181 Encounter for therapeutic drug level monitoring: Secondary | ICD-10-CM

## 2020-01-22 MED ORDER — OXYCODONE HCL 10 MG PO TABS: 10.0000 mg | ORAL_TABLET | Freq: Three times a day (TID) | ORAL | 0 refills | Status: DC | PRN

## 2020-01-22 NOTE — Progress Notes (Signed)
Subjective:    Patient ID: Katherine [name], female    DOB: [birth year], 64 y.o.   MRN: [id number]  HPI: Carolin Quang is a 64 y.o. female who returns for follow up appointment for chronic pain and medication refill. She states her pain is located in her bilateral shoulders, lower back pain radiating into her bilateral lower extremities, bilateral hip and bilateral knee pain. Also reports generalized joint pain. Ms. Nylund reports increase intensity of pain and only receiving 4- 5 hours of pain relief with current medication regimen. We will increase her tablets this month, and re-assess on her next scheduled appointment, she verbalizes understanding. She rates her pain 9. Her current exercise regime is walking and performing stretching exercises.  Ms. Chiem arrived with oxygen desaturation and reports her oxygen tank is in her car. Oxygen level was re-checked.   Ms. Watkin was admitted to [facility] on 12/25/2019 for SOB, CHF Exacerbation, she was discharged home on 12/29/2019. Note was reviewed.   Ms. Scurlock asked if she could use CBD oil products, we reviewed office policy and she verbalizes understanding not to use CBD oil products.   Ms. Bremner Morphine equivalent is 45.00  MME. She is also prescribed Lorazepam by Dr. Jimmy Footman. We have discussed the black box warning of using opioids and benzodiazepines. I highlighted the dangers of using these drugs together and discussed the adverse events including respiratory suppression, overdose, cognitive impairment and importance of compliance with current regimen. We will continue to monitor and adjust as indicated.   Oral Swab Ordered today.     Pain Inventory Average Pain 7 Pain Right Now 9 My pain is constant, burning and aching  In the last 24 hours, has pain interfered with the following? General activity 8 Relation with others 7 Enjoyment of life 7 What TIME of day is your pain at its worst? night Sleep (in  general) Good  Pain is worse with: walking, standing and some activites Pain improves with: medication and injections Relief from Meds: 8  Mobility walk with assistance use a cane use a walker how many minutes can you walk? 1 ability to climb steps?  yes do you drive?  yes  Function disabled: date disabled .  Neuro/Psych weakness tremor trouble walking depression  Prior Studies Any changes since last visit?  no  Physicians involved in your care Any changes since last visit?  no   Family History  Problem Relation Age of Onset  . Cancer Mother        liver  . Heart disease Father   . Cancer Father        colon  . Arrhythmia Brother        Atrial Fibrillation  . Arrhythmia Paternal Aunt        Atrial Fibrillation   Social History   Socioeconomic History  . Marital status: Widowed    Spouse name: Not on file  . Number of children: Not on file  . Years of education: Not on file  . Highest education level: Not on file  Occupational History  . Not on file  Tobacco Use  . Smoking status: Former Smoker    Packs/day: 1.00    Years: 30.00    Pack years: 30.00    Types: Cigarettes    Quit date: 08/17/2002    Years since quitting: 17.4  . Smokeless tobacco: Never Used  Substance and Sexual Activity  . Alcohol use: No  . Drug use: No  . Sexual  activity: Not on file  Other Topics Concern  . Not on file  Social History Narrative   She is currently married, and the caregiver of her husband who is recovering from surgery for tongue cancer now diagnosed with lung cancer. Prior to his diagnosis of her husband, she actually had adopted a 41-year-old child who she knows caring for as well. With all the surgeries, they have been quite financially troubled. Thanks the help of her community and church, they have been able to stay "alfoat."     She is a former smoker who quit in 2004 after a 30-pack-year history.   She is active chasing a 50-year-old child, does not do routine  exercise. She's been quite depressed with the condition of her husband, and admits to eating comfort herself.   She does not drink alcohol.   Social Determinants of Health   Financial Resource Strain:   . Difficulty of Paying Living Expenses:   Food Insecurity: No Food Insecurity  . Worried About Charity fundraiser in the Last Year: Never true  . Ran Out of Food in the Last Year: Never true  Transportation Needs: No Transportation Needs  . Lack of Transportation (Medical): No  . Lack of Transportation (Non-Medical): No  Physical Activity:   . Days of Exercise per Week:   . Minutes of Exercise per Session:   Stress:   . Feeling of Stress :   Social Connections:   . Frequency of Communication with Friends and Family:   . Frequency of Social Gatherings with Friends and Family:   . Attends Religious Services:   . Active Member of Clubs or Organizations:   . Attends Archivist Meetings:   [name] Kitchen Marital Status:    Past Surgical History:  Procedure Laterality Date  . ABDOMINAL AORTOGRAM N/A 04/21/2018   Procedure: ABDOMINAL AORTOGRAM;  Surgeon: [name], MD;  Location: [place] CV LAB;  Service: Cardiovascular;  Laterality: N/A;  . CATHETER REMOVAL    . CHOLECYSTECTOMY N/A 10/29/2014   Procedure: LAPAROSCOPIC CHOLECYSTECTOMY WITH INTRAOPERATIVE CHOLANGIOGRAM;  Surgeon: Excell Seltzer, MD;  Location: [facility];  Service: General;  Laterality: N/A;  . CORONARY ANGIOPLASTY  1994   x5  . CORONARY ARTERY BYPASS GRAFT  1995   LIMA-LAD, SVG-RPDA, SVG-D1  . ESOPHAGOGASTRODUODENOSCOPY N/A 10/15/2016   Procedure: ESOPHAGOGASTRODUODENOSCOPY (EGD);  Surgeon: Wilford Corner, MD;  Location: [facility] ENDOSCOPY;  Service: Endoscopy;  Laterality: N/A;  . I & D EXTREMITY Right 01/29/2018   Procedure: IRRIGATION AND DEBRIDEMENT THUMB;  Surgeon: Dayna Barker, MD;  Location: [place];  Service: Plastics;  Laterality: Right;  . INCISE AND DRAIN ABCESS    . KIDNEY TRANSPLANT  1991  . KNEE  ARTHROSCOPY WITH LATERAL MENISECTOMY Left 12/03/2017   Procedure: LEFT KNEE ARTHROSCOPY WITH LATERAL MENISECTOMY;  Surgeon: Earlie Server, MD;  Location: [place];  Service: Orthopedics;  Laterality: Left;  . LAPAROSCOPIC GASTRIC BANDING  04/2004; 10/'09, 2/'10   Port Replacement x 2  . LEFT HEART CATH AND CORS/GRAFTS ANGIOGRAPHY N/A 04/21/2018   Procedure: LEFT HEART CATH AND CORS/GRAFTS ANGIOGRAPHY;  Surgeon: [name], MD;  Location: [place] CV LAB;  Ost-Prox LAD 50% - proxLAD (pre & post D1) 100% CTO. Cx - patent, small OM1 (stable ~ ostial OM1 90%, too small for PCI) & 2 small LPL; Ost-distal RCA 100% CTO.  LIMA-LAD (not injected); SVG-dRCA patent, SVG-D1 - insertion stent ~20% ISR - Severe R CFA disease w/ focal Sub TO  . LEFT HEART  CATH AND CORS/GRAFTS ANGIOGRAPHY  5/'01, 3/'02, 8/'03, 10/'04; 1/'15   08/22/2013: LAD & RCA 100%; LIMA-LAD & SVG-rPDA patent; Cx-- OM1 60%, OM2 ostial ~50%; SVG-D1 - 80% mid, 50% distal ISR --PCI  . LEFT HEART CATHETERIZATION WITH CORONARY/GRAFT ANGIOGRAM N/A 08/23/2013   Procedure: LEFT HEART CATHETERIZATION WITH Beatrix Fetters;  Surgeon: Wellington Hampshire, MD;  Location: [place] CATH LAB;  Service: Cardiovascular;  Laterality: N/A;  . Lower Extremity Arterial Dopplers  08/2013   ABI: R 0.96, L 1.04  . MULTIPLE TOOTH EXTRACTIONS  age 10  . NM MYOVIEW LTD  03/2016   EF 62%. LOW RISK. C/W prior MI - no Ischemia. Apical hypokinesis.  [name] Kitchen PERCUTANEOUS CORONARY STENT INTERVENTION (PCI-S)  5/'01, 3/'02, 8/'03, 10/'04;   '01 - S660 BMS 2.5 x 9 - dSVG-D1 into D1; '02- post-stent stenosis - 2.5 x 8 Pixel BMS; '8\03: ISR/Thrombosis into native D1 - AngioJet, 2.5 x 13 Pixel; '04 - ISR 95% - covered stented area with Taxus DES 2.5 mm x 20 (2.88)  . PERCUTANEOUS CORONARY STENT INTERVENTION (PCI-S)  08/23/2013   Procedure: PERCUTANEOUS CORONARY STENT INTERVENTION (PCI-S);  Surgeon: Wellington Hampshire, MD;  Location: [facility] CATH LAB;  Service: Cardiovascular;;mid SVG-D1 80%; distal  stent ~50% ISR; Promus Prermier DES 2.75 mm xc 20 mm (2.8 mm)  . PORT-A-CATH REMOVAL     kidney  . TRANSTHORACIC ECHOCARDIOGRAM  07/2019   EF 55 to 60%.  No LVH.  Paradoxical septal wall motion-post CABG.  GRII DD.  Normal RV size and function.  Mild bilateral atrial dilation.  Moderate MAC.  Trace MR.  Mild aortic stenosis (gradients: Mean 14.3 mmHg -peak 24.9 mmHg).  . TUBAL LIGATION    . wrist fistula repair Left    dialysis for one year   Past Medical History:  Diagnosis Date  . Anemia   . Anxiety   . Bilateral carotid artery stenosis    Carotid duplex 08/7406: 1-44% LICA, 81-85% RICA, >63% RECA, f/u 1 yr suggested  . CAD (coronary artery disease) of bypass graft 5/01; 3/'02, 8/'03, 10/'04; 1/15   PCI x 5 to SVG-D1   . CAD in native artery 07/1993   3 Vessel Disease (LAD-D1 & RCA) -- CABG (Dx in setting of inferior STEMI-PTCA of RCA)  . CAD S/P percutaneous coronary angioplasty    PCI to SVG-D1 insertion/native D1 x 4 = '01 -(S660 BMS 2.5 x 9 anastomosis- D1); '02 - distal overlap ACS Pixel 2.5 x 8  BMS; '03 distal/native ISR/Thrombosis - Pixel 2.5 x 13; '04 - ISR-  Taxus 2.5 x 20 (covered all);; 1/15 - mid SVG-D1 (50% distal ISR) - Promus P 2.75 x 20 -- 2.8 mm  . COPD mixed type ([place])    Followed by Dr. Lamonte Sakai "pulmonologist said no COPD"  . Depression   . Depression with anxiety   . Diabetes mellitus type 2 in obese ([place])   . Diarrhea    started after cholecystectomy and mass removed from intestine  . Dyslipidemia, goal LDL below 70    08/2012: TC 137, TG 200, HDL 32!, LDL 45; on statin (followed by Dr.Deterding)  . ESRD (end stage renal disease) ([place]) 1991   s/p [facility] - Dr. Jimmy Footman)   . Family history of adverse reaction to anesthesia    mom's bp dropped during/after anesthesia  . Fibromyalgia   . GERD (gastroesophageal reflux disease)   . Glomerulonephritis, chronic, rapidly progressive 58  . H/O ST elevation myocardial infarction (STEMI) of  inferoposterior wall 07/1993   Rescue PTCA of RCA -- referred for CABG.  . H/O: GI bleed   . Headache    migraines in the past  . History of CABG x 3 08/1993   Dr. Servando Snare: LIMA-LAD, SVG-bifurcatingD1, SVG-rPDA  . History of kidney stones   . History of stroke 2012   "right eye stroke- half blind now"  . Hypertension associated with diabetes ([place])   . Mild aortic stenosis by prior echocardiogram 07/2019   Echo:  Mild aortic stenosis (gradients: Mean 14.3 mmHg -peak 24.9 mmHg).  . Morbid obesity ([place])   . MRSA (methicillin resistant staph aureus) culture positive   . OSA (obstructive sleep apnea)    no longer on CPAP or home O2, states she doesn't need now after lap band  . PAD (peripheral artery disease) ([place]) 08/2013   LEA Dopplers to be read by Dr. Fletcher Anon  . PAF (paroxysmal atrial fibrillation) ([place]) 06/2014   Noted on CardioNet Monitor  - --> rhythm control with Tikosyn (Dr. Rayann Heman); converted from warfarin to apixaban for anticoagulation.  . Pneumonia   . Recurrent boils    Bilateral Groin  . Rheumatoid arthritis ([place])    Per Patient Report; associated with OA  . S/p cadaver renal transplant [place]  . Unstable angina ([place]) 5/01; 3/'02, 8/'03, 10/'04; 1/15   x 5 occurences since Inf-Post STEMI in 1994   BP (!) 182/88   Pulse 87   Temp (!) 95.1 F (35.1 C)   Ht _0  (1.549 m)   Wt 214 lb 9.6 oz (97.3 kg)   SpO2 (!) 86% Comment: No O2 TODAY  BMI 40.55 kg/m   Opioid Risk Score:   Fall Risk Score:  `1  Depression screen PHQ 2/9  Depression screen [facility] 2/9 01/04/2020 11/02/2018 07/04/2018  Decreased Interest 0 3 3  Down, Depressed, Hopeless _1 PHQ - 2 Score _2 Altered sleeping - 1 3  Tired, decreased energy - 1 3  Change in appetite - 1 2  Feeling bad or failure about yourself  - 0 3  Trouble concentrating - 0 2  Moving slowly or fidgety/restless - 0 0  Suicidal thoughts - 0 0  PHQ-9 Score - 9 19  Difficult doing work/chores - - Very difficult  Some  recent data might be hidden    Review of Systems  Constitutional: Positive for unexpected weight change.  HENT: Negative.   Eyes: Negative.   Respiratory: Positive for shortness of breath.   Gastrointestinal: Negative.   Musculoskeletal: Positive for arthralgias, back pain and gait problem.  Skin: Negative.   Allergic/Immunologic: Negative.   Neurological: Positive for tremors and weakness.  Hematological: Negative.   Psychiatric/Behavioral: Positive for dysphoric mood.  All other systems reviewed and are negative.      Objective:   Physical Exam Vitals and nursing note reviewed.  Constitutional:      Appearance: Normal appearance.  Cardiovascular:     Rate and Rhythm: Normal rate and regular rhythm.     Pulses: Normal pulses.     Heart sounds: Normal heart sounds.  Pulmonary:     Effort: Pulmonary effort is normal.     Breath sounds: Normal breath sounds.  Musculoskeletal:     Cervical back: Normal range of motion and neck supple.     Comments: Normal Muscle Bulk and Muscle Testing Reveals:  Upper Extremities: Full ROM and Muscle Strength 5/5 Bilateral AC Joint Tenderness  Thoracic Paraspinal  Tenderness: T-7-T-9 Lumbar Paraspinal Tenderness: L-3-L-5 Lower Extremities: Full ROM and Muscle Strength 5/5   Transfer to Wheelchair and escorted to her car by CMA  Skin:    General: Skin is warm and dry.  Neurological:     Mental Status: She is alert and oriented to person, place, and time.  Psychiatric:        Mood and Affect: Mood normal.        Behavior: Behavior normal.           Assessment & Plan:  1. Fibromyalgia: Continue with HEP as tolerated. Continue Tegretol and Cymbalta. 01/22/2020 2. Rheumatoid Arthritis: Refilled: Oxycodone 77m one tablet every 8 hours as needed for severe pain #60. Continueto Monitor. 01/22/2020 3. Osteoarthritis--polyarticular: Continue with current medication regime and continue to monitor.01/22/2020.. 4. ESRD with hx of renal  transplant on chronic steroids: Nephrology Following.01/22/2020. 5. Right rotator cuff tendonitis/like degenerative/inflammatory changes in the right shoulder also. Continue HEP and Continue to Monitor.01/22/2020. 6. Muscle Spasm: Continue Flexeril. 01/22/2020 7. Depression: Continue current medication regimen. Continue to monitor.  01/22/2020. 8. Lumbar Radiculitis: Previous  Left 405 Transformanial ESI with relief noted. Continue HEP as Tolerated.Continue to Monitor. 01/22/2020. 9.Bilateral Greater Trochanteric Bursitis: Continue HEP alternate Ice and Heat Therapy. 01/22/2020. 10. Type 2 DM with Peripheral Neuropathy: Continue Tegretol. 06/607/2021 11. Oxygen Desaturation: O2 Saturation Re-checked: Educated on wearing her Oxygen at all times a s prescribed, she verbalizes understanding.   20  minutes of face to face patient care time was spent during this visit. All questions were encouraged and answered.  F/U in 2 Months. Ms. SCallinswould like to see Dr SNaaman Plummer

## 2020-01-23 ENCOUNTER — Ambulatory Visit: Payer: Medicare Other | Admitting: Physician Assistant

## 2020-01-24 DIAGNOSIS — Z940 Kidney transplant status: Secondary | ICD-10-CM | POA: Diagnosis not present

## 2020-01-24 DIAGNOSIS — N2581 Secondary hyperparathyroidism of renal origin: Secondary | ICD-10-CM | POA: Diagnosis not present

## 2020-01-24 DIAGNOSIS — I129 Hypertensive chronic kidney disease with stage 1 through stage 4 chronic kidney disease, or unspecified chronic kidney disease: Secondary | ICD-10-CM | POA: Diagnosis not present

## 2020-01-24 DIAGNOSIS — E785 Hyperlipidemia, unspecified: Secondary | ICD-10-CM | POA: Diagnosis not present

## 2020-01-24 DIAGNOSIS — D631 Anemia in chronic kidney disease: Secondary | ICD-10-CM | POA: Diagnosis not present

## 2020-01-24 DIAGNOSIS — I739 Peripheral vascular disease, unspecified: Secondary | ICD-10-CM | POA: Diagnosis not present

## 2020-01-24 DIAGNOSIS — E669 Obesity, unspecified: Secondary | ICD-10-CM | POA: Diagnosis not present

## 2020-01-24 DIAGNOSIS — E118 Type 2 diabetes mellitus with unspecified complications: Secondary | ICD-10-CM | POA: Diagnosis not present

## 2020-01-24 DIAGNOSIS — N182 Chronic kidney disease, stage 2 (mild): Secondary | ICD-10-CM | POA: Diagnosis not present

## 2020-01-24 DIAGNOSIS — I2510 Atherosclerotic heart disease of native coronary artery without angina pectoris: Secondary | ICD-10-CM | POA: Diagnosis not present

## 2020-01-24 DIAGNOSIS — I639 Cerebral infarction, unspecified: Secondary | ICD-10-CM | POA: Diagnosis not present

## 2020-01-24 DIAGNOSIS — I509 Heart failure, unspecified: Secondary | ICD-10-CM | POA: Diagnosis not present

## 2020-01-25 ENCOUNTER — Encounter: Payer: Self-pay | Admitting: *Deleted

## 2020-01-25 ENCOUNTER — Other Ambulatory Visit: Payer: Self-pay | Admitting: *Deleted

## 2020-01-25 DIAGNOSIS — I083 Combined rheumatic disorders of mitral, aortic and tricuspid valves: Secondary | ICD-10-CM | POA: Diagnosis not present

## 2020-01-25 DIAGNOSIS — I5043 Acute on chronic combined systolic (congestive) and diastolic (congestive) heart failure: Secondary | ICD-10-CM | POA: Diagnosis not present

## 2020-01-25 DIAGNOSIS — I110 Hypertensive heart disease with heart failure: Secondary | ICD-10-CM | POA: Diagnosis not present

## 2020-01-25 DIAGNOSIS — J449 Chronic obstructive pulmonary disease, unspecified: Secondary | ICD-10-CM | POA: Diagnosis not present

## 2020-01-25 DIAGNOSIS — I2510 Atherosclerotic heart disease of native coronary artery without angina pectoris: Secondary | ICD-10-CM | POA: Diagnosis not present

## 2020-01-25 DIAGNOSIS — I0981 Rheumatic heart failure: Secondary | ICD-10-CM | POA: Diagnosis not present

## 2020-01-25 NOTE — Patient Outreach (Signed)
[facility]) Care Management [place] PCP office completes Transition of Care follow up post-hospital discharge Post-hospital discharge day # 27 without unplanned hospital re-admission Unsuccessful (consecutive) outreach attempt # 1- previously engaged patient  01/25/2020  [name] 11-Mar-1956 [id number]  2:20 pm: Unsuccessful telephone outreach to Katherine [name], 64 y/o female referred to [facility] RN CM 01/04/20 by [facility] CMA for [place] notification; patient was recently hospitalized May 10-14, 2021 for shortness of breath/ malaise/ CHF exacerbation that occurred after she received her corona virus vaccine.  She was discharged home to self-care with ongoing home health services for RN/ PT/ OT.  Patient has history including, but not limited to, HTN/ HLD; PAD; paroxysmal Atrial Fibrillation; MI with CABG x 3; DM II; renal transplant; fibromyalgia; CHF/ OSA; morbid obesity with previous lap-banding surgery; and CKD.  HIPAA compliant voice mail message left for patient, requesting return call back.  Plan:  Will re-attempt THN CM telephone outreach within 4 business days if I do not hear back from patient first.  Oneta Rack, RN, BSN, [place] Coordinator [facility] Care Management  [phone]

## 2020-01-26 ENCOUNTER — Other Ambulatory Visit: Payer: Self-pay | Admitting: *Deleted

## 2020-01-26 ENCOUNTER — Encounter: Payer: Self-pay | Admitting: *Deleted

## 2020-01-26 NOTE — Patient Outreach (Signed)
[facility]) Care Management [place] PCP office completes Transition of Care follow up post-hospital discharge Post-hospital discharge day # 28 without unplanned hospital re-admission  01/26/2020  Katherine [name] 1956-05-25 [id number]  Successful telephone outreachto Katherine [name], 64 y/o female referred to [facility] RN CM 01/04/20 by [facility] CMA for [place] notification; patient was recently hospitalized May 10-14, 2010for shortness of breath/ malaise/ CHF exacerbation that occurred after she received her corona virus vaccine. She was discharged home to self-care with ongoing home health services for RN/ PT/ OT. Patient has history including, but not limited to, HTN/ HLD; PAD; paroxysmal Atrial Fibrillation; MI with CABG x 3; DM II; renal transplant; fibromyalgia; CHF/ OSA; morbid obesity with previous lap-banding surgery; and CKD.  HIPAA/ identity verified; patient reports that she is currently visiting with her niece and has a few minutes to talk; she reports ongoing chronic pain, unchanged from baseline and states medication helps.  Confirms that she attended recent pain clinic office visit and had her "regular" pain medication prescription renewed. She sounds to be in no distress throughout phone call today.  Patient further reports: -- no concerns/ issues/ problems with medications; again declines medication review as she is visiting with family member; agrees to complete at time of next scheduled outreach.  Confirms that she continues taking recently prescribed diuretic as instructed and using home O2 without changes  -- home health services continue; believes that home health PT has now signed off; reports visit from home health RN "yesterday;" encouraged her ongoing participation; reports she contacted home health nurse around issues with her portable oxygen tank; confirms issue is now resolved and that DME representative "fixed" issue  -- has  attended all recent provider office visits; denies recent changes to overall plan of care; has not yet scheduled PCP office visit; to visit cardiologist next week as scheduled and states "will go form there" to schedule PCP office visit, as she has multiple provider office visits scheduled post-hospital discharge; continues to deny transportation issues  -- continues monitoring and recording daily weights; denies issues with weight gain at home; reports ongoing daily weights between 207-210 lbs ---- denies concerns with breathing status; able to verbalize accurate understanding of signs/ symptoms CHF yellow zone along with corresponding action plan; reports has "dealt with" CHF " a long time"   Patient denies further issues, concerns, or problems today and she agrees to complete [facility] RN CM initial assessment at time of next outreach; needs medication review and Advanced Directive follow up, which she does not wish to complete today due to visiting with family at time of call.  I confirmed that patient hasmy direct phone number, the main [facility] CM office phone number, and the [facility] CM 24-hour nurse advice phone number should issues arise prior to next scheduled THN CM outreach.  Encouraged patient to contact me directly if needs, questions, issues, or concerns arise prior to next scheduled outreach; patient agreed to do so.  Plan:  Patient will take medications as prescribed and will attend all scheduled provider appointments  Patient will promptly schedule post-hospital discharge office visit with PCP  Patient will promptly notify care providers for any new concerns/ issues/ problems that arise  Patient will actively participate in home health services as ordered post-hospital discharge  Patient will continue monitoring/ recording daily weightsand following weight gain guidelines in setting of CHF  THN CM outreach to continue with scheduled phone call within 2 weeks, post-scheduled cardiology  provider office visit  [facility] CM Care Plan Problem One     Most Recent Value  Care Plan Problem One High risk for hospital readmission related to/ as evidenced by recent hospitalization for CHF exacerbation after receiving corona virus vaccine  Role Documenting the Problem One Care Management Coordinator  Care Plan for Problem One Active  THN Long Term Goal  Over the next 31 days, patient will not experience unplanned hospital readmission as evidenced by patient reporting and review of EHR during [facility] RN CM outreach  [facility] Long Term Goal Start Date 01/04/20  Interventions for Problem One Long Term Goal Discussed with patient current clinical condition and confirmed she has no current clinical concerns,  reviewed recent provider appointments and recent daily weights recorded at home,  using teachback method, confirmed that patient remains able to independently and accurately verbalize signs/ symptoms CHF yellow zone along with corresponding action plan,  reviewed use of newly prescribed diuretic with patient and confirmed that she continues to take as prescribed,  confirmed that patient has no current medication concerns,  encouraged her to consider completing medication review at time of next scheduled outreach,  discussed her use of home O2 and confirmed that she has no current concerns or recent changes to home O2 use  THN CM Short Term Goal #1  Over the next 30 days, patient will continue to actively participate in home health services as ordered post-hospital discharge as evidenced by patient reporting and collaboration with home health team as indicated during [facility] RN CM outreach  [facility] CM Short Term Goal #1 Start Date 01/04/20  Interventions for Short Term Goal #1 Confirmed that patient continues actively participating in home health services and reviewed recent home visits with patient,  confirmed that she has contact information for home health team,  encouraged her ongoing active participation in all  disciplines of home health services  THN CM Short Term Goal #2  Over the next 14 days, patient will schedule post-hospital discharge appointment with her PCP as evidenced by patient reporting and collaboration with PCP office team as indicated during [facility] RN CM outreach  [goal re-established/ extended]  [facility] CM Short Term Goal #2 Start Date 01/26/20  [Goal re-established and extended today]  Interventions for Short Term Goal #2 Confirmed that patient has not yet scheduled PCP office visit and encouraged her to consider promptly doing,  confirmed that patient has scheduled cardiology office visit scheduled for next week, along with plans to attend as scheduled     Oneta Rack, RN, BSN, [place] Coordinator [facility] Care Management  [phone]

## 2020-01-27 LAB — DRUG TOX MONITOR 1 W/CONF, ORAL FLD
Amphetamines: NEGATIVE ng/mL (ref <10)
Barbiturates: NEGATIVE ng/mL (ref <10)
Benzodiazepines: NEGATIVE ng/mL (ref <0.50)
Buprenorphine: NEGATIVE ng/mL (ref <0.10)
Cocaine: NEGATIVE ng/mL (ref <5.0)
Codeine: NEGATIVE ng/mL (ref <2.5)
Dihydrocodeine: NEGATIVE ng/mL (ref <2.5)
Fentanyl: NEGATIVE ng/mL (ref <0.10)
Heroin Metabolite: NEGATIVE ng/mL (ref <1.0)
Hydrocodone: NEGATIVE ng/mL (ref <2.5)
Hydromorphone: NEGATIVE ng/mL (ref <2.5)
MARIJUANA: NEGATIVE ng/mL (ref <2.5)
MDMA: NEGATIVE ng/mL (ref <10)
Meprobamate: NEGATIVE ng/mL (ref <2.5)
Methadone: NEGATIVE ng/mL (ref <5.0)
Morphine: NEGATIVE ng/mL (ref <2.5)
Nicotine Metabolite: NEGATIVE ng/mL (ref <5.0)
Norhydrocodone: NEGATIVE ng/mL (ref <2.5)
Noroxycodone: 10.5 ng/mL — ABNORMAL HIGH (ref <2.5)
Opiates: POSITIVE ng/mL — AB (ref <2.5)
Oxycodone: 55.6 ng/mL — ABNORMAL HIGH (ref <2.5)
Oxymorphone: NEGATIVE ng/mL (ref <2.5)
Phencyclidine: NEGATIVE ng/mL (ref <10)
Tapentadol: NEGATIVE ng/mL (ref <5.0)
Tramadol: NEGATIVE ng/mL (ref <5.0)
Zolpidem: NEGATIVE ng/mL (ref <5.0)

## 2020-01-27 LAB — DRUG TOX ALC METAB W/CON, ORAL FLD: Alcohol Metabolite: NEGATIVE ng/mL (ref <25)

## 2020-02-02 ENCOUNTER — Ambulatory Visit: Payer: Medicare Other | Admitting: Physician Assistant

## 2020-02-05 ENCOUNTER — Encounter: Payer: Self-pay | Admitting: *Deleted

## 2020-02-05 ENCOUNTER — Telehealth: Payer: Self-pay | Admitting: *Deleted

## 2020-02-05 ENCOUNTER — Other Ambulatory Visit: Payer: Self-pay | Admitting: *Deleted

## 2020-02-05 NOTE — Telephone Encounter (Signed)
Oral swab drug screen was consistent for prescribed medications.  ?

## 2020-02-05 NOTE — Patient Outreach (Signed)
[facility]) Care Management [facility] CM Telephone Outreach Post-hospital discharge day # 38 without unplanned hospital re-admission  02/05/2020  Katherine [name] [birth year] [id number]  Successful telephone outreachto Katherine [name], 64 y/o female referred to [facility] RN CM5/20/21by Terry for EMMI Red-Alert notification after hospitalization May 10-14, 2086fr shortness of breath/ malaise/ CHF exacerbation that occurred after she received her corona virus vaccine. She was discharged home to self-care with ongoing home health services for RN/ PT/ OT. Patient has history including, but not limited to, HTN/ HLD; PAD; paroxysmal Atrial Fibrillation; MI with CABG x 3; DM II; renal transplant; fibromyalgia; CHF/ OSA; morbid obesity with previous lap-banding surgery; and CKD.  HIPAA/ identity verified; patient reports that she is "not feeling so great" today and reports "nausea" which "happens occasionally."  She denies need to contact PCP office, stating, "it usually just goes away if I give it long enough."  She sounds to be in no distress throughour phone call today.  Patient further reports: -- no concerns/ issues/ problems with medications and she again refuses review of medications, as [facility], as she "just doesn't feel up to it;" discussed purpose of medication review however, she again assures me that she has all of her medications, understands the purpose of them, takes as prescribed.  States "maybe" will complete at time of next outreach, depending on how she feels.  -- home health services continue; "not sure" when next RN visit is; confirms that she will contact team to determine the schedule; as noted with last outreach, home health PT has now signed off.  Confirms has contact information for home health team  -- did not attend scheduled cardiology provider office visit on June 18; states "I just did not feel like going;" visit has now  been re-scheduled for February 21, 2020; patient assures me she will attend this appointment as scheduled; also noted appointment March 02, 2020 for PFT with pulmonology provider; confirms that she continues driving self to provider appointments; continues to report that she does not feel an appointment with her PCP is necessary since she has scheduled appointments with her specialists for cardiology and pulmonary providers  -- continues monitoring and recording daily weights; denies issues with weight gain at home; reports ongoing daily weights between 207-210 lbs, with weight this morning of "210 lbs." ---- denies concerns with breathing status; able to verbalize accurate understanding of signs/ symptoms CHF yellow zone along with corresponding action plan; again reports has "dealt with" CHF " a long time"  ---- using teach back method, discussed signs/ symptoms weight gain- CHF exacerbation along with corresponding action plan; reviewed with patient weight gain guidelines/ corresponding action plan in setting of CHF; patient is unable to independently verbalize weight gain guidelines, stating that she "just knows" when she is "running into trouble;" education around same was provided. ---- tells me today for the first time that she did not receive the Advanced Directives education that was mailed to her at time of initial outreach- will re-send  Patient denies further issues, concerns, or problems today.I confirmed that patient hasmy direct phone number, the [facility] office phone number, and the [facility] 24-hour nurse advice phone number should issues arise prior to next scheduled [facility] CM outreach. Encouraged patient to contact me directly if needs, questions, issues, or concerns arise prior to next scheduled outreach; patient agreed to do so.  Plan:  Patient will take medications as prescribed and will attend  all scheduled provider appointments  Patient will promptly notify care providers for any new  concerns/ issues/ problems that arise  Patient will actively participate in home health services as ordered post-hospital discharge  Patient will continue monitoring/ recording daily weightsand following weight gain guidelines in setting of CHF  I will send today's note/ care plan to patient's PCP as [facility] RN CM initial assessment  I will re-send patient printed educational material around Advanced Directive planning  [facility] CM outreach to continue with scheduled phonecall next month, post-scheduled provider office visits  [facility] CM Care Plan Problem One     Most Recent Value  Care Plan Problem One High risk for hospital readmission related to/ as evidenced by recent hospitalization for CHF exacerbation after receiving corona virus vaccine  Role Documenting the Problem One Care Management Coordinator  Care Plan for Problem One Not Active  [facility] Long Term Goal  Over the next 31 days, patient will not experience unplanned hospital readmission as evidenced by patient reporting and review of EHR during [facility] RN CM outreach  [facility] Long Term Goal Start Date 01/04/20  [facility] Long Term Goal Met Date 02/05/20  [facility] Met]  Interventions for Problem One Long Term Goal Confirmed that patient has not experienced hospital re-admission,  discussed her current clinical condition with her and confirmed that she has no current clinical concerns,  continued to request review of medication with patient, who continues to refuse/ decline,  sent [employer] CM initial assessment to PCP  [facility] CM Short Term Goal #1  Over the next 30 days, patient will continue to actively participate in home health services as ordered post-hospital discharge as evidenced by patient reporting and collaboration with home health team as indicated during [facility] RN CM outreach  [facility] CM Short Term Goal #1 Start Date 01/04/20  [facility] CM Short Term Goal #1 Met Date 02/05/20  [Goal met]  Interventions for Short Term Goal #1 Confirmed that home health services remain  active and that patient continues actively participating,  encouraged patient to maintain communication with home health team and confirmed that she has home health contact information  [facility] CM Short Term Goal #2  Over the next 14 days, patient will schedule post-hospital discharge appointment with her PCP as evidenced by patient reporting and collaboration with PCP office team as indicated during [facility] CM outreach  [goal re-established/ extended]  [facility] CM Short Term Goal #2 Start Date 01/26/20  [facility] CM Short Term Goal #2 Met Date 02/05/20  [Goal not met]  Interventions for Short Term Goal #2 Again encouraged patient to schedule PCP office visit,  discussed importance of PCP involvement in her overall care    [facility] CM Care Plan Problem Two     Most Recent Value  Care Plan Problem Two Ongoing self-health management of multiple chronic disease conditions as evidenced by patient reporting  Role Documenting the Problem Two Care Management Coordinator  Care Plan for Problem Two Active  Interventions for Problem Two Long Term Goal  Reviewed with patient [place] appointments,  discussed patient's reasoning for missing recently scheduled cardiology provider appointment,  encouraged patient again to schedule PCP appointment,  confirmed that patient continues to drive self to appointments  [facility] Long Term Goal Over the next 60 days, patient will attend all scheduled provider appointments as evidenced by patient reporting and review of EHR during [facility] RN CM ouutreach  [facility] Long Term Goal Start Date 02/05/20     Oneta Rack, RN, BSN,  [name] [facility] Management  [phone]

## 2020-02-07 DIAGNOSIS — I110 Hypertensive heart disease with heart failure: Secondary | ICD-10-CM | POA: Diagnosis not present

## 2020-02-07 DIAGNOSIS — I083 Combined rheumatic disorders of mitral, aortic and tricuspid valves: Secondary | ICD-10-CM | POA: Diagnosis not present

## 2020-02-07 DIAGNOSIS — I2510 Atherosclerotic heart disease of native coronary artery without angina pectoris: Secondary | ICD-10-CM | POA: Diagnosis not present

## 2020-02-07 DIAGNOSIS — J449 Chronic obstructive pulmonary disease, unspecified: Secondary | ICD-10-CM | POA: Diagnosis not present

## 2020-02-07 DIAGNOSIS — I5043 Acute on chronic combined systolic (congestive) and diastolic (congestive) heart failure: Secondary | ICD-10-CM | POA: Diagnosis not present

## 2020-02-07 DIAGNOSIS — I0981 Rheumatic heart failure: Secondary | ICD-10-CM | POA: Diagnosis not present

## 2020-02-08 ENCOUNTER — Other Ambulatory Visit: Payer: Self-pay | Admitting: Registered Nurse

## 2020-02-08 DIAGNOSIS — F329 Major depressive disorder, single episode, unspecified: Secondary | ICD-10-CM

## 2020-02-08 DIAGNOSIS — E1142 Type 2 diabetes mellitus with diabetic polyneuropathy: Secondary | ICD-10-CM

## 2020-02-08 DIAGNOSIS — M797 Fibromyalgia: Secondary | ICD-10-CM

## 2020-02-08 NOTE — Telephone Encounter (Signed)
I spoke with Mrs Macchi and she is taking 200 mg Sertraline daily prescribed by her kidney MD.  I will correct med list to reflect this.

## 2020-02-08 NOTE — Telephone Encounter (Signed)
Sybil RN placed a call to Ms. Stanly regarding her Zoloft and Cymbalta, she stated her Nephrologist is prescribing the Zoloft. This provider spoke with Pharmacist Ebony Hail she stated Ms. Sandhu has been on Zoloft 200 mg at [facility] since October 2020, we will continue her Cymbalta. Ms. Hokenson denies any adverse reaction to [place].

## 2020-02-14 ENCOUNTER — Telehealth: Payer: Self-pay

## 2020-02-14 NOTE — Telephone Encounter (Signed)
Katherine [name] would like you to call her back  if possible.   PH# [phone].   Patient c/o dizziness, forgetting things with  some confusion. Per patient episodes of each have been minor. She was told the combination of Zoloft & Cymbalta maybe the cause.   Please advise. Thank you

## 2020-02-15 ENCOUNTER — Telehealth (HOSPITAL_COMMUNITY): Payer: Self-pay | Admitting: Physical Medicine & Rehabilitation

## 2020-02-15 NOTE — Telephone Encounter (Signed)
Spoke to Katherine [name] and recommended reducing cymbalta to 30mg  daily for one week. If she still is noticing a fog, she can stop cymbalta completely in 7 days. We also discussed other pharm, medical, and psychiatric contributors to her memory/fog.

## 2020-02-21 ENCOUNTER — Ambulatory Visit: Payer: Medicare Other | Admitting: Adult Health

## 2020-02-21 NOTE — Progress Notes (Signed)
Virtual Visit via Telephone Note   This visit type was conducted due to national recommendations for restrictions regarding the COVID-19 Pandemic (e.g. social distancing) in an effort to limit this patient's exposure and mitigate transmission in our community.  Due to her co-morbid illnesses, this patient is at least at moderate risk for complications without adequate follow up.  This format is felt to be most appropriate for this patient at this time.  The patient did not have access to video technology/had technical difficulties with video requiring transitioning to audio format only (telephone).  All issues noted in this document were discussed and addressed.  No physical exam could be performed with this format.  Please refer to the patient's chart for her  consent to telehealth for [facility].   Date:  02/22/2020   ID:  Katherine [name], DOB May 11, 1956, MRN [id number]  Patient Location: Home Provider Location: Home  PCP:  [name], MD  Cardiologist:  [name], MD  Electrophysiologist:  None   Evaluation Performed:  Follow-Up Visit  Chief Complaint:  Follow up  History of Present Illness:    Katherine [name] is a 64 y.o. female with we are following for ongoing assessment and management after being seen on consultation by Dr. Ellyn Hack on 12/25/2019.  Patient has a longstanding history of coronary artery disease, coronary artery bypass grafting, and PCI.    Patient also has a history of persistent atrial fibrillation on Eliquis, rate controlled with Tikosyn along with metoprolol; echocardiogram revealing moderate aortic valve stenosis with grade 2 diastolic dysfunction.  Other history includes end-stage renal disease status post renal transplant, COPD on home oxygen during the night, morbid obesity with history of lap band.  She was seen on consultation at the request of hospitalist service in the setting of acute heart failure.  She presented to the ED on 12/25/2019  with complaints of worsening dyspnea and hypoxia with persistent cough, myalgias, and low-grade fevers.  This occurred shortly after she got her first injection of Covid vaccine.  Chest x-ray revealed signs of pulmonary edema, with BNP mildly elevated.  She was ruled out for ACS.  She was diagnosed with acute on chronic diastolic heart failure.  Initially they could not exclude the possibility of vaccine related myocarditis.  Echocardiogram revealed grade 3 diastolic dysfunction.  A cardiac MRI was ordered to exclude myocarditis.  In the meantime she was started on 40 mg IV of Lasix twice daily.  MRI dated 12/27/2019 did not find evidence of myocarditis.  LVEF was 56% with hypokinesis of the mid anterior septal anterior and akinesis of the apical septal anterior and inferior walls.  After IV diuresis, the patient was transitioned to p.o. Lasix, she was also evaluated by PT OT who recommended home health PT OT on discharge. She is now on  Lasix 40 mg BID now per nephrology.   Chest pain at rest in the middle of her chest. Feels like someone has "punched in the chest," takes her breath away. Occurs rarely, several weeks go by without this recurring. Usually when she is overly tired.  She checks her weight everyday and has not gained any weight. She lost 30 lbs since December 2020. She has a lap band but is not having it tightened due to vomiting.   She has generalized fatigue and has multiple appointments scheduled for follow up with different providers.   The patient does not have symptoms concerning for COVID-19 infection (fever, chills, cough, or new shortness of  breath).      Past Medical History:  Diagnosis Date  . Anemia   . Anxiety   . Bilateral carotid artery stenosis    Carotid duplex 10/6627: 4-76% LICA, 54-65% RICA, >03% RECA, f/u 1 yr suggested  . CAD (coronary artery disease) of bypass graft 5/01; 3/'02, 8/'03, 10/'04; 1/15   PCI x 5 to SVG-D1   . CAD in native artery 07/1993   3  Vessel Disease (LAD-D1 & RCA) -- CABG (Dx in setting of inferior STEMI-PTCA of RCA)  . CAD S/P percutaneous coronary angioplasty    PCI to SVG-D1 insertion/native D1 x 4 = '01 -(S660 BMS 2.5 x 9 anastomosis- D1); '02 - distal overlap ACS Pixel 2.5 x 8  BMS; '03 distal/native ISR/Thrombosis - Pixel 2.5 x 13; '04 - ISR-  Taxus 2.5 x 20 (covered all);; 1/15 - mid SVG-D1 (50% distal ISR) - Promus P 2.75 x 20 -- 2.8 mm  . COPD mixed type ([place])    Followed by Dr. Lamonte Sakai "pulmonologist said no COPD"  . Depression   . Depression with anxiety   . Diabetes mellitus type 2 in obese ([place])   . Diarrhea    started after cholecystectomy and mass removed from intestine  . Dyslipidemia, goal LDL below 70    08/2012: TC 137, TG 200, HDL 32!, LDL 45; on statin (followed by Dr.Deterding)  . ESRD (end stage renal disease) ([place]) 1991   s/p Cadaveric Renal Transplant [facility] - Dr. Jimmy Footman)   . Family history of adverse reaction to anesthesia    mom's bp dropped during/after anesthesia  . Fibromyalgia   . GERD (gastroesophageal reflux disease)   . Glomerulonephritis, chronic, rapidly progressive 75  . 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 07/1993   Rescue PTCA of RCA -- referred for CABG.  . H/O: GI bleed   . Headache    migraines in the past  . History of CABG x 3 08/1993   Dr. Servando Snare: LIMA-LAD, SVG-bifurcatingD1, SVG-rPDA  . History of kidney stones   . History of stroke 2012   "right eye stroke- half blind now"  . Hypertension associated with diabetes ([place])   . Mild aortic stenosis by prior echocardiogram 07/2019   Echo:  Mild aortic stenosis (gradients: Mean 14.3 mmHg -peak 24.9 mmHg).  . Morbid obesity ([place])   . MRSA (methicillin resistant staph aureus) culture positive   . OSA (obstructive sleep apnea)    no longer on CPAP or home O2, states she doesn't need now after lap band  . PAD (peripheral artery disease) ([place]) 08/2013   LEA Dopplers to be read by Dr. Fletcher Anon    . PAF (paroxysmal atrial fibrillation) ([place]) 06/2014   Noted on CardioNet Monitor  - --> rhythm control with Tikosyn (Dr. Rayann Heman); converted from warfarin to apixaban for anticoagulation.  . Pneumonia   . Recurrent boils    Bilateral Groin  . Rheumatoid arthritis ([place])    Per Patient Report; associated with OA  . S/p cadaver renal transplant [place]  . Unstable angina ([place]) 5/01; 3/'02, 8/'03, 10/'04; 1/15   x 5 occurences since Inf-Post STEMI in 1994   Past Surgical History:  Procedure Laterality Date  . ABDOMINAL AORTOGRAM N/A 04/21/2018   Procedure: ABDOMINAL AORTOGRAM;  Surgeon: [name], MD;  Location: [place] CV LAB;  Service: Cardiovascular;  Laterality: N/A;  . CATHETER REMOVAL    . CHOLECYSTECTOMY N/A 10/29/2014   Procedure: LAPAROSCOPIC CHOLECYSTECTOMY WITH INTRAOPERATIVE CHOLANGIOGRAM;  Surgeon: Excell Seltzer, MD;  Location: [facility] ORS;  Service: General;  Laterality: N/A;  . CORONARY ANGIOPLASTY  1994   x5  . CORONARY ARTERY BYPASS GRAFT  1995   LIMA-LAD, SVG-RPDA, SVG-D1  . ESOPHAGOGASTRODUODENOSCOPY N/A 10/15/2016   Procedure: ESOPHAGOGASTRODUODENOSCOPY (EGD);  Surgeon: Wilford Corner, MD;  Location: [facility] ENDOSCOPY;  Service: Endoscopy;  Laterality: N/A;  . I & D EXTREMITY Right 01/29/2018   Procedure: IRRIGATION AND DEBRIDEMENT THUMB;  Surgeon: Dayna Barker, MD;  Location: [place];  Service: Plastics;  Laterality: Right;  . INCISE AND DRAIN ABCESS    . KIDNEY TRANSPLANT  1991  . KNEE ARTHROSCOPY WITH LATERAL MENISECTOMY Left 12/03/2017   Procedure: LEFT KNEE ARTHROSCOPY WITH LATERAL MENISECTOMY;  Surgeon: Earlie Server, MD;  Location: [place];  Service: Orthopedics;  Laterality: Left;  . LAPAROSCOPIC GASTRIC BANDING  04/2004; 10/'09, 2/'10   Port Replacement x 2  . LEFT HEART CATH AND CORS/GRAFTS ANGIOGRAPHY N/A 04/21/2018   Procedure: LEFT HEART CATH AND CORS/GRAFTS ANGIOGRAPHY;  Surgeon: [name], MD;  Location: [place] CV LAB;  Ost-Prox LAD 50%  - proxLAD (pre & post D1) 100% CTO. Cx - patent, small OM1 (stable ~ ostial OM1 90%, too small for PCI) & 2 small LPL; Ost-distal RCA 100% CTO.  LIMA-LAD (not injected); SVG-dRCA patent, SVG-D1 - insertion stent ~20% ISR - Severe R CFA disease w/ focal Sub TO  . LEFT HEART CATH AND CORS/GRAFTS ANGIOGRAPHY  5/'01, 3/'02, 8/'03, 10/'04; 1/'15   08/22/2013: LAD & RCA 100%; LIMA-LAD & SVG-rPDA patent; Cx-- OM1 60%, OM2 ostial ~50%; SVG-D1 - 80% mid, 50% distal ISR --PCI  . LEFT HEART CATHETERIZATION WITH CORONARY/GRAFT ANGIOGRAM N/A 08/23/2013   Procedure: LEFT HEART CATHETERIZATION WITH Beatrix Fetters;  Surgeon: Wellington Hampshire, MD;  Location: [place] CATH LAB;  Service: Cardiovascular;  Laterality: N/A;  . Lower Extremity Arterial Dopplers  08/2013   ABI: R 0.96, L 1.04  . MULTIPLE TOOTH EXTRACTIONS  age 58  . NM MYOVIEW LTD  03/2016   EF 62%. LOW RISK. C/W prior MI - no Ischemia. Apical hypokinesis.  [name] Kitchen PERCUTANEOUS CORONARY STENT INTERVENTION (PCI-S)  5/'01, 3/'02, 8/'03, 10/'04;   '01 - S660 BMS 2.5 x 9 - dSVG-D1 into D1; '02- post-stent stenosis - 2.5 x 8 Pixel BMS; '8\03: ISR/Thrombosis into native D1 - AngioJet, 2.5 x 13 Pixel; '04 - ISR 95% - covered stented area with Taxus DES 2.5 mm x 20 (2.88)  . PERCUTANEOUS CORONARY STENT INTERVENTION (PCI-S)  08/23/2013   Procedure: PERCUTANEOUS CORONARY STENT INTERVENTION (PCI-S);  Surgeon: Wellington Hampshire, MD;  Location: [facility] CATH LAB;  Service: Cardiovascular;;mid SVG-D1 80%; distal stent ~50% ISR; Promus [place] DES 2.75 mm xc 20 mm (2.8 mm)  . PORT-A-CATH REMOVAL     kidney  . TRANSTHORACIC ECHOCARDIOGRAM  07/2019   EF 55 to 60%.  No LVH.  Paradoxical septal wall motion-post CABG.  GRII DD.  Normal RV size and function.  Mild bilateral atrial dilation.  Moderate MAC.  Trace MR.  Mild aortic stenosis (gradients: Mean 14.3 mmHg -peak 24.9 mmHg).  . TUBAL LIGATION    . wrist fistula repair Left    dialysis for one year     Current Meds  Medication  Sig  . albuterol (PROVENTIL) (2.5 MG/3ML) 0.083% nebulizer solution Take 3 mLs (2.5 mg total) by nebulization every 6 (six) hours as needed for wheezing or shortness of breath (dx: J45.20).  [name] Kitchen albuterol (VENTOLIN HFA) 108 (90 Base) MCG/ACT inhaler TAKE 2  PUFFS BY MOUTH EVERY 6 HOURS AS NEEDED FOR WHEEZE OR SHORTNESS OF BREATH  . apixaban (ELIQUIS) 5 MG TABS tablet Take 1 tablet (5 mg total) by mouth 2 (two) times daily.  [name] Kitchen arformoterol (BROVANA) 15 MCG/2ML NEBU Take 2 mLs (15 mcg total) by nebulization 2 (two) times daily.  [name] Kitchen azaTHIOprine (IMURAN) 50 MG tablet Take 125 mg by mouth every evening. Taking 2 & 1/2 tabs (132m) qd  . B-D ULTRAFINE III SHORT PEN 31G X 8 MM MISC See admin instructions.  . budesonide (PULMICORT) 0.5 MG/2ML nebulizer solution Take 2 mLs (0.5 mg total) by nebulization 2 (two) times daily.  . calcitRIOL (ROCALTROL) 0.25 MCG capsule Take 0.25 mcg by mouth every 3 (three) days.   . clotrimazole (MYCELEX) 10 MG troche Take 1 tablet (10 mg total) by mouth 5 (five) times daily.  . cyclobenzaprine (FLEXERIL) 10 MG tablet TAKE 1 TABLET BY MOUTH 3 TIMES DAILY AS NEEDED FOR MUSCLE SPASMS (Patient taking differently: Take 10 mg by mouth 2 (two) times daily. )  . diltiazem (CARDIZEM CD) 120 MG 24 hr capsule TAKE 1 CAPSULE BY MOUTH EVERY DAY (Patient taking differently: Take 120 mg by mouth daily. )  . dofetilide (TIKOSYN) 250 MCG capsule TAKE 1 CAPSULE BY MOUTH TWICE A DAY (Patient taking differently: Take 250 mcg by mouth 2 (two) times daily. TAKE 1 CAPSULE BY MOUTH TWICE A DAY)  . DULoxetine (CYMBALTA) 30 MG capsule Take 30 mg by mouth daily.  . ferrous sulfate 325 (65 FE) MG tablet Take 1 tablet (325 mg total) by mouth daily with breakfast.  . fluticasone (FLONASE) 50 MCG/ACT nasal spray PLACE 2 SPRAYS INTO BOTH NOSTRILS 2 TIMES DAILY (Patient taking differently: Place 2 sprays into both nostrils 2 (two) times daily. )  . furosemide (LASIX) 40 MG tablet Take 40 mg by mouth 2 (two)  times daily.  .[name] Kitchenglimepiride (AMARYL) 4 MG tablet Take 4 mg by mouth daily.  . Insulin Glargine (BASAGLAR KWIKPEN) 100 UNIT/ML SOPN Inject 12 Units into the skin daily.  .[name] KitchenlamoTRIgine (LAMICTAL) 25 MG tablet TAKE 1 TABLET BY MOUTH EVERYDAY AT BEDTIME  . LORazepam (ATIVAN) 1 MG tablet Take 1 mg by mouth daily. Takes daily  . metFORMIN (GLUCOPHAGE) 500 MG tablet Take 500 mg by mouth 2 (two) times daily.  . metoprolol tartrate (LOPRESSOR) 50 MG tablet Take 1 tablet (50 mg total) by mouth 2 (two) times daily.  . montelukast (SINGULAIR) 10 MG tablet TAKE 1 TABLET BY MOUTH EVERY DAY (Patient taking differently: Take 10 mg by mouth daily. )  . nitroGLYCERIN (NITROSTAT) 0.4 MG SL tablet Place 1 tablet (0.4 mg total) under the tongue every 5 (five) minutes as needed for chest pain.  .[name] KitchenNOVOLOG FLEXPEN 100 UNIT/ML FlexPen Inject 4 Units into the skin 2 (two) times daily after a meal.   . omeprazole (PRILOSEC) 20 MG capsule Take 20 mg by mouth daily.  .Glory RosebushULTRA test strip USE AS DIRECTED 3 TIMES DAILY TO CHECK BLOOD SUGAR  . Oxycodone HCl 10 MG TABS Take 1 tablet (10 mg total) by mouth every 8 (eight) hours as needed.  . predniSONE (DELTASONE) 5 MG tablet Take 5 mg by mouth daily.  . promethazine (PHENERGAN) 25 MG tablet Take 25 mg by mouth at bedtime. Or for sleep  . rosuvastatin (CRESTOR) 20 MG tablet Take 1 tablet (20 mg total) by mouth daily.  . sertraline (ZOLOFT) 100 MG tablet Take 200 mg by mouth at bedtime.   . [  DISCONTINUED] DULoxetine (CYMBALTA) 30 MG capsule TAKE 2 CAPSULES (60 MG TOTAL) BY MOUTH EVERY MORNING.  . [DISCONTINUED] furosemide (LASIX) 40 MG tablet Take 1 tablet (40 mg total) by mouth 2 (two) times daily for 2 days, THEN 1 tablet (40 mg total) daily. May take additional 1 tablet for edema, SOB or weight gain.  . [DISCONTINUED] potassium chloride 20 MEQ TBCR Take one tablet on the days you take additional dose of lasix     Allergies:   Tetracycline, Hydromorphone hcl, Niacin,  Niaspan [niacin er], Sulfa antibiotics, Sulfonamide derivatives, Codeine, Erythromycin, Hydromorphone hcl, Morphine and related, Nalbuphine, Sulfasalazine, and Tape   Social History   Tobacco Use  . Smoking status: Former Smoker    Packs/day: 1.00    Years: 30.00    Pack years: 30.00    Types: Cigarettes    Quit date: 08/17/2002    Years since quitting: 17.5  . Smokeless tobacco: Never Used  Vaping Use  . Vaping Use: Never used  Substance Use Topics  . Alcohol use: No  . Drug use: No     Family Hx: The patient's family history includes Arrhythmia in her brother and paternal aunt; Cancer in her father and mother; Heart disease in her father.  ROS:   Please see the history of present illness.    All other systems reviewed and are negative.   Prior CV studies:   (Copied from Dr. Allison Quarry note for accuracy). CARDIAC HISTORY:  1994-->Inferior-posterior STEMI->3V CAD:LAD-D1 bifurcation&RCA->emergent PTCA of infarct related artery (RCA) --> ? eventually went for for CABGx3: LIMA-LAD, SVG-D1, SVG-RPDA.  Multiple Caths:2001, 2002, 2003 than 2004 all with lesions in the SVG-D1. Initial stent followed by's are followed by stent thrombosis..  Cath-PCIfor UA was in January 2015: Native LAD and RCA 100% occluded. LIMA-LAD and SVG-RPDA patent.LCx -OM1 60%& ostOM 2 50% with normal OM 3. SVG-D1 had 80% mid and 50% distal ISR-->1- PCI with a Promus Premier DES 2.78m x 20 mm.  Myoview August 20162f preop: EF 60%. Small anteroapical infarct but no ischemia. Mild apical hypokinesis. EF 62%. LOW RISK  Cath 04/2018- stable findings; RCA &LAD-TO, Stable small OM1 90%. SVG-D1 ~20% distal insertion Stent ISR (otw patent), Patent SVG-dRCA (did not see LIMA). R CFA subtotal stenosis.  ? This was for recurrent Afib RVR --> had Angina-type pain with Afib  EP consult - 9/23 - Tikosyn load.;   CHA2DS2Vasc Score (HTN,DM, F,CAD, CVA = 6) =>Warfarin. -> converted to  Eliquis -> once her antiepileptic medication was changed from Tegretol to Lamictal  Echo12/2020:CHA2DS2VAScEF 55 to 60%. No LVH. Paradoxical septal wall motion-post CABG. GRII DD. Normal RV size and function. Mild bilateral atrial dilation. Moderate MAC. Trace MR. Mild aortic stenosis (gradients: Mean 14.3 mmHg -peak 24.9 mmHg).  Relevant CV Studies:Reviewed in past surgical history.  04/2018-most recent Cardiac Cath(Image below): Ost-Prox LAD 50% - proxLAD (pre &post D1) 100% CTO. Cx - patent, small OM1 (stable ~ ostial OM1 90%, too small for PCI) &2 small LPL; Ost-distal RCA 100% CTO. LIMA-LAD (not injected); SVG-dRCA patent, SVG-D1 - insertion site stent ~20% ISR - Severe R CFA disease with focal subtotal occlusion.     07/2019-Echo (with PNA):EF 55 to 60%. No LVH. Paradoxical septal wall motion-post CABG. GRII DD. Normal RV size and function. Mild bilateral atrial dilation. Moderate MAC. Trace MR. Mild aortic stenosis (gradients: Mean 14.3 mmHg -peak 24.9 mmHg).   Cardiac MRI 12/27/2019 1. Normal left ventricular size, with mild basal septal hypertrophy and normal systolic function (LVEF =  56%). There is hypokinesis of the mid anteroseptal, anterior and akinesis of the apical septal, anterior and inferior walls. There is late gadolinium enhancement in the mid anteroseptal, anterior walls (50-75%) and in the apical septal, anterior and inferior walls (75-100%).  2. Normal right ventricular size, thickness and systolic function (LVEF = 62%). There are no regional wall motion abnormalities.  3.  Moderately dilated left atrium. Normal right atrial size.  4. Normal size of the aortic root, ascending aorta. Mildly dilated pulmonary artery measuring 31 mm.  5.  Mild mitral and trivial tricuspid regurgitation.  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 Overall LVEF is preserved. There is no evidence for  an acute myocarditis.  Labs/Other Tests and Data Reviewed:    EKG:  No ECG reviewed.  Recent Labs: 12/25/2019: B Natriuretic Peptide 422.2 12/28/2019: ALT 9 12/29/2019: BUN 13; Creatinine, Ser 0.76; Hemoglobin 10.7; Magnesium 1.9; Platelets 308; Potassium 4.1; Sodium 134   Recent Lipid Panel Lab Results  Component Value Date/Time   CHOL 111 11/30/2017 04:05 PM   TRIG 129.0 11/30/2017 04:05 PM   HDL 53.90 11/30/2017 04:05 PM   CHOLHDL 2 11/30/2017 04:05 PM   LDLCALC 32 11/30/2017 04:05 PM    Wt Readings from Last 3 Encounters:  02/22/20 210 lb (95.3 kg)  01/22/20 214 lb 9.6 oz (97.3 kg)  12/29/19 208 lb 14.4 oz (94.8 kg)     Objective:    Vital Signs:  Ht _0  (1.549 m)   Wt 210 lb (95.3 kg)   BMI 39.68 kg/m    VITAL SIGNS:  reviewed GEN:  no acute distress RESPIRATORY:  normal respiratory effort, symmetric expansion NEURO:  alert and oriented x 3, no obvious focal deficit PSYCH:  normal affect  ASSESSMENT & PLAN:    1. CAD: Hx of CABG and PCI.  She denies significant cardiac chest pain. She has occasional PVC which feels like a punch in the chest. She has these several weeks apart but they occur when she is fatigued.  Continue current regimen and secondary management.   2. Atrial fib: She remains on Eliquis, Tikosyn and metoprolol. She will need to keep appointment with  Afib clinic in Sept of 2021. I have reminded her of the appointment and given her date and time which she has written down.  3. Chronic Diastolic CHF Grade III: She is now on lasix 40 mg BID per nephrology. They are following labs. She states that her weight has been stable. She is not complaining of edema.   4. COPD: Followed by Dr. Lamonte Sakai.  Sees him this month. Wears O2 via [place] at HS.   COVID-19 Education: The signs and symptoms of COVID-19 were discussed with the patient and how to seek care for testing (follow up with PCP or arrange E-visit).  The importance of social distancing was discussed  today.  Time:   Today, I have spent 30 minutes with the patient with telehealth technology discussing the above problems, providing follow up appointment dates, with chart review and documentation.    Medication Adjustments/Labs and Tests Ordered: Current medicines are reviewed at length with the patient today.  Concerns regarding medicines are outlined above.   Tests Ordered: No orders of the defined types were placed in this encounter.   Medication Changes: No orders of the defined types were placed in this encounter.   Disposition:  Follow up 6 months   Signed, Phill Myron. [place] Pugh, ANP, AACC  02/22/2020  12:12 PM    [place]

## 2020-02-21 NOTE — Progress Notes (Deleted)
Cardiology Office Note   Date:  02/21/2020   ID:  Katherine [name], DOB [birth year], MRN [id number]  PCP:  [name], MD  Cardiologist:  Dr. Ellyn Hack  No chief complaint on file.    History of Present Illness: Katherine [name] is a 64 y.o. female who presents for posthospitalization follow-up.  After admission for acute on chronic diastolic heart failure.  Echocardiogram revealed EF of 55 to 60% with grade 3 diastolic dysfunction.  Cardiac MRI was without evidence of myocarditis, with findings consistent with old myocardial infarction in the mid LAD territory.  It was felt that her volume overload was related to decrease inflammation leading to worsening diastolic function.  She was noted to have many CHF exacerbations over the last 10 years.  The patient was released on p.o. Lasix 40 mg twice daily after diuresing 2.04 L.  She was continued on beta-blocker therapy.  She is also complaining of chest pain which was reproducible with palpation consistent with musculoskeletal etiology.  She was treated with oxycodone.  It was felt that her pain was more likely pleuritic, with prior Myoview study nonischemic completed during similar symptoms.  Patient also has persistent atrial fibrillation remaining in normal sinus rhythm on Tikosyn diltiazem and metoprolol.  She remains on Eliquis.  She was supplemented with potassium due to level of 3.6 during hospitalization.  She was discharged on 12/29/2019.  The patient was to have PT and OT at home.  She was also tested for Covid which was found to be negative on 12/25/2019.  She spoke with Dr. Tessa Lerner concerning feeling "foggy" and she was advised to decrease Cymbalta to 30 mg daily for 1 week to evaluate whether this improved her symptoms.  Past Medical History:  Diagnosis Date  . Anemia   . Anxiety   . Bilateral carotid artery stenosis    Carotid duplex 10/7167: 6-78% LICA, 93-81% RICA, >01% RECA, f/u 1 yr suggested  . CAD (coronary artery  disease) of bypass graft 5/01; 3/'02, 8/'03, 10/'04; 1/15   PCI x 5 to SVG-D1   . CAD in native artery 07/1993   3 Vessel Disease (LAD-D1 & RCA) -- CABG (Dx in setting of inferior STEMI-PTCA of RCA)  . CAD S/P percutaneous coronary angioplasty    PCI to SVG-D1 insertion/native D1 x 4 = '01 -(S660 BMS 2.5 x 9 anastomosis- D1); '02 - distal overlap ACS Pixel 2.5 x 8  BMS; '03 distal/native ISR/Thrombosis - Pixel 2.5 x 13; '04 - ISR-  Taxus 2.5 x 20 (covered all);; 1/15 - mid SVG-D1 (50% distal ISR) - Promus P 2.75 x 20 -- 2.8 mm  . COPD mixed type ([place])    Followed by Dr. Lamonte Sakai "pulmonologist said no COPD"  . Depression   . Depression with anxiety   . Diabetes mellitus type 2 in obese ([place])   . Diarrhea    started after cholecystectomy and mass removed from intestine  . Dyslipidemia, goal LDL below 70    08/2012: TC 137, TG 200, HDL 32!, LDL 45; on statin (followed by Dr.Deterding)  . ESRD (end stage renal disease) ([place]) 1991   s/p [facility]al Trans[facility] - Dr. Jimmy Footman)   . Family history of adverse reaction to anesthesia    mom's bp dropped during/after anesthesia  . Fibromyalgia   . GERD (gastroesophageal reflux disease)   . Glomerulonephritis, chronic, rapidly progressive 81  . 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 07/1993   Rescue PTCA of RCA --  referred for CABG.  . H/O: GI bleed   . Headache    migraines in the past  . History of CABG x 3 08/1993   Dr. Servando Snare: LIMA-LAD, SVG-bifurcatingD1, SVG-rPDA  . History of kidney stones   . History of stroke 2012   "right eye stroke- half blind now"  . Hypertension associated with diabetes ([place])   . Mild aortic stenosis by prior echocardiogram 07/2019   Echo:  Mild aortic stenosis (gradients: Mean 14.3 mmHg -peak 24.9 mmHg).  . Morbid obesity ([place])   . MRSA (methicillin resistant staph aureus) culture positive   . OSA (obstructive sleep apnea)    no longer on CPAP or home O2, states she  doesn't need now after lap band  . PAD (peripheral artery disease) ([place]) 08/2013   LEA Dopplers to be read by Dr. Fletcher Anon  . PAF (paroxysmal atrial fibrillation) ([place]) 06/2014   Noted on CardioNet Monitor  - --> rhythm control with Tikosyn (Dr. Rayann Heman); converted from warfarin to apixaban for anticoagulation.  . Pneumonia   . Recurrent boils    Bilateral Groin  . Rheumatoid arthritis ([place])    Per Patient Report; associated with OA  . S/p cadaver renal transplant [place]  . Unstable angina ([place]) 5/01; 3/'02, 8/'03, 10/'04; 1/15   x 5 occurences since Inf-Post STEMI in 1994    Past Surgical History:  Procedure Laterality Date  . ABDOMINAL AORTOGRAM N/A 04/21/2018   Procedure: ABDOMINAL AORTOGRAM;  Surgeon: [name], MD;  Location: [place];  Service: Cardiovascular;  Laterality: N/A;  . CATHETER REMOVAL    . CHOLECYSTECTOMY N/A 10/29/2014   Procedure: LAPAROSCOPIC CHOLECYSTECTOMY WITH INTRAOPERATIVE CHOLANGIOGRAM;  Surgeon: Excell Seltzer, MD;  Location: [facility];  Service: General;  Laterality: N/A;  . CORONARY ANGIOPLASTY  1994   x5  . CORONARY ARTERY BYPASS GRAFT  1995   LIMA-LAD, SVG-RPDA, SVG-D1  . ESOPHAGOGASTRODUODENOSCOPY N/A 10/15/2016   Procedure: ESOPHAGOGASTRODUODENOSCOPY (EGD);  Surgeon: Wilford Corner, MD;  Location: [facility] ENDOSCOPY;  Service: Endoscopy;  Laterality: N/A;  . I & D EXTREMITY Right 01/29/2018   Procedure: IRRIGATION AND DEBRIDEMENT THUMB;  Surgeon: Dayna Barker, MD;  Location: [facility];  Service: Plastics;  Laterality: Right;  . INCISE AND DRAIN ABCESS    . KIDNEY TRANSPLANT  1991  . KNEE ARTHROSCOPY WITH LATERAL MENISECTOMY Left 12/03/2017   Procedure: LEFT KNEE ARTHROSCOPY WITH LATERAL MENISECTOMY;  Surgeon: Earlie Server, MD;  Location: [facility];  Service: Orthopedics;  Laterality: Left;  . LAPAROSCOPIC GASTRIC BANDING  04/2004; 10/'09, 2/'10   Port Replacement x 2  . LEFT HEART CATH AND CORS/GRAFTS ANGIOGRAPHY N/A 04/21/2018   Procedure:  LEFT HEART CATH AND CORS/GRAFTS ANGIOGRAPHY;  Surgeon: [name], MD;  Location: [facility];  Ost-Prox LAD 50% - proxLAD (pre & post D1) 100% CTO. Cx - patent, small OM1 (stable ~ ostial OM1 90%, too small for PCI) & 2 small LPL; Ost-distal RCA 100% CTO.  LIMA-LAD (not injected); SVG-dRCA patent, SVG-D1 - insertion stent ~20% ISR - Severe R CFA disease w/ focal Sub TO  . LEFT HEART CATH AND CORS/GRAFTS ANGIOGRAPHY  5/'01, 3/'02, 8/'03, 10/'04; 1/'15   08/22/2013: LAD & RCA 100%; LIMA-LAD & SVG-rPDA patent; Cx-- OM1 60%, OM2 ostial ~50%; SVG-D1 - 80% mid, 50% distal ISR --PCI  . LEFT HEART CATHETERIZATION WITH CORONARY/GRAFT ANGIOGRAM N/A 08/23/2013   Procedure: LEFT HEART CATHETERIZATION WITH Beatrix Fetters;  Surgeon: Wellington Hampshire, MD;  Location: [facility] CATH LAB;  Service: Cardiovascular;  Laterality: N/A;  . Lower Extremity Arterial Dopplers  08/2013   ABI: R 0.96, L 1.04  . MULTIPLE TOOTH EXTRACTIONS  age 71  . NM MYOVIEW LTD  03/2016   EF 62%. LOW RISK. C/W prior MI - no Ischemia. Apical hypokinesis.  [name] Kitchen PERCUTANEOUS CORONARY STENT INTERVENTION (PCI-S)  5/'01, 3/'02, 8/'03, 10/'04;   '01 - S660 BMS 2.5 x 9 - dSVG-D1 into D1; '02- post-stent stenosis - 2.5 x 8 Pixel BMS; '8\03: ISR/Thrombosis into native D1 - AngioJet, 2.5 x 13 Pixel; '04 - ISR 95% - covered stented area with Taxus DES 2.5 mm x 20 (2.88)  . PERCUTANEOUS CORONARY STENT INTERVENTION (PCI-S)  08/23/2013   Procedure: PERCUTANEOUS CORONARY STENT INTERVENTION (PCI-S);  Surgeon: Wellington Hampshire, MD;  Location: [facility] CATH LAB;  Service: Cardiovascular;;mid SVG-D1 80%; distal stent ~50% ISR; Promus Prermier DES 2.75 mm xc 20 mm (2.8 mm)  . PORT-A-CATH REMOVAL     kidney  . TRANSTHORACIC ECHOCARDIOGRAM  07/2019   EF 55 to 60%.  No LVH.  Paradoxical septal wall motion-post CABG.  GRII DD.  Normal RV size and function.  Mild bilateral atrial dilation.  Moderate MAC.  Trace MR.  Mild aortic stenosis (gradients: Mean 14.3 mmHg  -peak 24.9 mmHg).  . TUBAL LIGATION    . wrist fistula repair Left    dialysis for one year     Current Outpatient Medications  Medication Sig Dispense Refill  . albuterol (PROVENTIL) (2.5 MG/3ML) 0.083% nebulizer solution Take 3 mLs (2.5 mg total) by nebulization every 6 (six) hours as needed for wheezing or shortness of breath (dx: J45.20). 75 mL 2  . albuterol (VENTOLIN HFA) 108 (90 Base) MCG/ACT inhaler TAKE 2 PUFFS BY MOUTH EVERY 6 HOURS AS NEEDED FOR WHEEZE OR SHORTNESS OF BREATH 18 g 5  . apixaban (ELIQUIS) 5 MG TABS tablet Take 1 tablet (5 mg total) by mouth 2 (two) times daily. 60 tablet 3  . arformoterol (BROVANA) 15 MCG/2ML NEBU Take 2 mLs (15 mcg total) by nebulization 2 (two) times daily. 120 mL 12  . azaTHIOprine (IMURAN) 50 MG tablet Take 125 mg by mouth every evening. Taking 2 & 1/2 tabs (147m) qd    . B-D ULTRAFINE III SHORT PEN 31G X 8 MM MISC See admin instructions.    . budesonide (PULMICORT) 0.5 MG/2ML nebulizer solution Take 2 mLs (0.5 mg total) by nebulization 2 (two) times daily. 360 mL 1  . calcitRIOL (ROCALTROL) 0.25 MCG capsule Take 0.25 mcg by mouth every 3 (three) days.     . clotrimazole (MYCELEX) 10 MG troche Take 1 tablet (10 mg total) by mouth 5 (five) times daily. 35 tablet 0  . cyclobenzaprine (FLEXERIL) 10 MG tablet TAKE 1 TABLET BY MOUTH 3 TIMES DAILY AS NEEDED FOR MUSCLE SPASMS (Patient taking differently: Take 10 mg by mouth 2 (two) times daily. ) 90 tablet 2  . diltiazem (CARDIZEM CD) 120 MG 24 hr capsule TAKE 1 CAPSULE BY MOUTH EVERY DAY (Patient taking differently: Take 120 mg by mouth daily. ) 90 capsule 3  . dofetilide (TIKOSYN) 250 MCG capsule TAKE 1 CAPSULE BY MOUTH TWICE A DAY (Patient taking differently: Take 250 mcg by mouth 2 (two) times daily. TAKE 1 CAPSULE BY MOUTH TWICE A DAY) 180 capsule 2  . DULoxetine (CYMBALTA) 30 MG capsule TAKE 2 CAPSULES (60 MG TOTAL) BY MOUTH EVERY MORNING. 180 capsule 2  . ferrous sulfate 325 (65 FE) MG tablet Take  1 tablet (325  mg total) by mouth daily with breakfast. 60 tablet 0  . fluticasone (FLONASE) 50 MCG/ACT nasal spray PLACE 2 SPRAYS INTO BOTH NOSTRILS 2 TIMES DAILY (Patient taking differently: Place 2 sprays into both nostrils 2 (two) times daily. ) 48 mL 3  . furosemide (LASIX) 40 MG tablet Take 1 tablet (40 mg total) by mouth 2 (two) times daily for 2 days, THEN 1 tablet (40 mg total) daily. May take additional 1 tablet for edema, SOB or weight gain. 180 tablet 0  . Insulin Glargine (BASAGLAR KWIKPEN) 100 UNIT/ML SOPN Inject 12 Units into the skin daily.    [name] Kitchen lamoTRIgine (LAMICTAL) 25 MG tablet TAKE 1 TABLET BY MOUTH EVERYDAY AT BEDTIME 90 tablet 0  . LORazepam (ATIVAN) 1 MG tablet Take 1 mg by mouth daily. Takes daily    . metFORMIN (GLUCOPHAGE) 500 MG tablet Take 500 mg by mouth 2 (two) times daily.  6  . metoprolol tartrate (LOPRESSOR) 50 MG tablet Take 1 tablet (50 mg total) by mouth 2 (two) times daily. 60 tablet 6  . montelukast (SINGULAIR) 10 MG tablet TAKE 1 TABLET BY MOUTH EVERY DAY (Patient taking differently: Take 10 mg by mouth daily. ) 90 tablet 1  . nitroGLYCERIN (NITROSTAT) 0.4 MG SL tablet Place 1 tablet (0.4 mg total) under the tongue every 5 (five) minutes as needed for chest pain. 25 tablet 6  . NOVOLOG FLEXPEN 100 UNIT/ML FlexPen Inject 4 Units into the skin 2 (two) times daily after a meal.   6  . omeprazole (PRILOSEC) 20 MG capsule Take 20 mg by mouth daily.    Glory Rosebush ULTRA test strip USE AS DIRECTED 3 TIMES DAILY TO CHECK BLOOD SUGAR    . Oxycodone HCl 10 MG TABS Take 1 tablet (10 mg total) by mouth every 8 (eight) hours as needed. 70 tablet 0  . potassium chloride 20 MEQ TBCR Take one tablet on the days you take additional dose of lasix 30 tablet 1  . predniSONE (DELTASONE) 5 MG tablet Take 5 mg by mouth daily.    . promethazine (PHENERGAN) 25 MG tablet Take 25 mg by mouth at bedtime. Or for sleep    . rosuvastatin (CRESTOR) 20 MG tablet Take 1 tablet (20 mg total) by  mouth daily. 90 tablet 3  . sertraline (ZOLOFT) 100 MG tablet Take 200 mg by mouth at bedtime.      No current facility-administered medications for this visit.    Allergies:   Tetracycline, Hydromorphone hcl, Niacin, Niaspan [niacin er], Sulfa antibiotics, Sulfonamide derivatives, Codeine, Erythromycin, Hydromorphone hcl, Morphine and related, Nalbuphine, Sulfasalazine, and Tape    Social History:  The patient  reports that she quit smoking about 17 years ago. Her smoking use included cigarettes. She has a 30.00 pack-year smoking history. She has never used smokeless tobacco. She reports that she does not drink alcohol and does not use drugs.   Family History:  The patient's family history includes Arrhythmia in her brother and paternal aunt; Cancer in her father and mother; Heart disease in her father.    ROS: All other systems are reviewed and negative. Unless otherwise mentioned in H&P    PHYSICAL EXAM: VS:  There were no vitals taken for this visit. , BMI There is no height or weight on file to calculate BMI. GEN: Well nourished, well developed, in no acute distress HEENT: normal Neck: no JVD, carotid bruits, or masses Cardiac: ***RRR; no murmurs, rubs, or gallops,no edema  Respiratory:  Clear to auscultation  bilaterally, normal work of breathing GI: soft, nontender, nondistended, + BS MS: no deformity or atrophy Skin: warm and dry, no rash Neuro:  Strength and sensation are intact Psych: euthymic mood, full affect   EKG:  EKG {ACTION; IS/IS [id number]} ordered today. The ekg ordered today demonstrates ***   Recent Labs: 12/25/2019: B Natriuretic Peptide 422.2 12/28/2019: ALT 9 12/29/2019: BUN 13; Creatinine, Ser 0.76; Hemoglobin 10.7; Magnesium 1.9; Platelets 308; Potassium 4.1; Sodium 134    Lipid Panel    Component Value Date/Time   CHOL 111 11/30/2017 1605   TRIG 129.0 11/30/2017 1605   HDL 53.90 11/30/2017 1605   CHOLHDL 2 11/30/2017 1605   VLDL 25.8  11/30/2017 1605   LDLCALC 32 11/30/2017 1605      Wt Readings from Last 3 Encounters:  01/22/20 214 lb 9.6 oz (97.3 kg)  12/29/19 208 lb 14.4 oz (94.8 kg)  12/06/19 216 lb 12.8 oz (98.3 kg)      Other studies Reviewed: Echo 12/25/19 1. Left ventricular ejection fraction, by estimation, is 55 to 60%. The  left ventricle has grossly normal systolic function. Technically difficult  study, unable to assess for regional wall motion abnormalities. There is  moderate left ventricular  hypertrophy. Left ventricular diastolic parameters are consistent with  Grade III diastolic dysfunction (restrictive). Elevated left atrial  pressure.  2. Right ventricle is poorly visualized. Right ventricular systolic  function is grossly mildly reduced. The right ventricular size is normal.  Tricuspid regurgitation signal is inadequate for assessing PA pressure.  3. Left atrial size was mildly dilated.  4. The mitral valve is abnormal. Moderate mitral annular calcification.  Trivial mitral valve regurgitation.  5. The aortic valve was not well visualized. Aortic valve regurgitation  is not visualized. Mild to moderate aortic valve stenosis (Vmax 2.7 m/s,  MG 15 mmHg, AVA 1.2 cm^2, DI 0.46)  6. Aortic dilatation noted. There is mild dilatation of the ascending  aorta measuring 36 mm.  7. The inferior vena cava is dilated in size with >50% respiratory  variability, suggesting right atrial pressure of 8 mmHg.   Cardiac MRI 12/27/19 IMPRESSION: 1. Normal left ventricular size, with mild basal septal hypertrophy and normal systolic function (LVEF = 56%). There is hypokinesis of the mid anteroseptal, anterior and akinesis of the apical septal, anterior and inferior walls. There is late gadolinium enhancement in the mid anteroseptal, anterior walls (50-75%) and in the apical septal, anterior and inferior walls (75-100%).  2. Normal right ventricular size, thickness and systolic function (LVEF =  62%). There are no regional wall motion abnormalities.  3. Moderately dilated left atrium. Normal right atrial size.  4. Normal size of the aortic root, ascending aorta. Mildly dilated pulmonary artery measuring 31 mm.  5. Mild mitral and trivial tricuspid regurgitation.  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 Overall LVEF is preserved. There is no evidence for an acute myocarditis.   ASSESSMENT AND PLAN:  1.  ***   Current medicines are reviewed at length with the patient today.  I have spent *** dedicated to the care of this patient on the date of this encounter to include pre-visit review of records, assessment, management and diagnostic testing,with shared decision making.  Labs/ tests ordered today include: *** Phill Myron. West Pugh, ANP, [facility]   02/21/2020 7:44 AM    [facility] [place] [address] Office [phone] Fax [phone]  Notice: This dictation was prepared with Viviann Spare  dictation along with smaller phrase technology. Any transcriptional errors that result from this process are unintentional and may not be corrected upon review.

## 2020-02-22 ENCOUNTER — Telehealth (INDEPENDENT_AMBULATORY_CARE_PROVIDER_SITE_OTHER): Payer: Medicare Other | Admitting: Adult Health

## 2020-02-22 ENCOUNTER — Encounter: Payer: Self-pay | Admitting: Adult Health

## 2020-02-22 VITALS — Ht 61.0 in | Wt 210.0 lb

## 2020-02-22 DIAGNOSIS — E785 Hyperlipidemia, unspecified: Secondary | ICD-10-CM

## 2020-02-22 DIAGNOSIS — T8610 Unspecified complication of kidney transplant: Secondary | ICD-10-CM

## 2020-02-22 DIAGNOSIS — I480 Paroxysmal atrial fibrillation: Secondary | ICD-10-CM

## 2020-02-22 DIAGNOSIS — I25119 Atherosclerotic heart disease of native coronary artery with unspecified angina pectoris: Secondary | ICD-10-CM

## 2020-02-22 DIAGNOSIS — I5032 Chronic diastolic (congestive) heart failure: Secondary | ICD-10-CM

## 2020-02-22 DIAGNOSIS — J411 Mucopurulent chronic bronchitis: Secondary | ICD-10-CM

## 2020-02-22 DIAGNOSIS — I350 Nonrheumatic aortic (valve) stenosis: Secondary | ICD-10-CM

## 2020-02-22 DIAGNOSIS — I10 Essential (primary) hypertension: Secondary | ICD-10-CM

## 2020-02-22 DIAGNOSIS — Z951 Presence of aortocoronary bypass graft: Secondary | ICD-10-CM

## 2020-02-22 DIAGNOSIS — I6521 Occlusion and stenosis of right carotid artery: Secondary | ICD-10-CM

## 2020-02-22 NOTE — Patient Instructions (Signed)
Medication Instructions:  Continue current medications  *If you need a refill on your cardiac medications before your next appointment, please call your pharmacy*   Lab Work: None Ordered If you have labs (blood work) drawn today and your tests are completely normal, you will receive your results only by: [name] Kitchen MyChart Message (if you have MyChart) OR . A paper copy in the mail If you have any lab test that is abnormal or we need to change your treatment, we will call you to review the results.   Testing/Procedures: None Ordered   Follow-Up: At [facility], you and your health needs are our priority.  As part of our continuing mission to provide you with exceptional heart care, we have created designated Provider Care Teams.  These Care Teams include your primary Cardiologist (physician) and Advanced Practice Providers (APPs -  Physician Assistants and Nurse Practitioners) who all work together to provide you with the care you need, when you need it.  We recommend signing up for the patient portal called "MyChart".  Sign up information is provided on this After Visit Summary.  MyChart is used to connect with patients for Virtual Visits (Telemedicine).  Patients are able to view lab/test results, encounter notes, upcoming appointments, etc.  Non-urgent messages can be sent to your provider as well.   To learn more about what you can do with MyChart, go to [email or web address].    Your next appointment:   6 month(s)  The format for your next appointment:   In Person  Provider:   You may see [name], MD or one of the following Advanced Practice Providers on your designated Care Team:    Rosaria Ferries, PA-C  Jory Sims, DNP, ANP  Cadence Kathlen Mody, NP

## 2020-02-27 ENCOUNTER — Other Ambulatory Visit: Payer: Self-pay | Admitting: Internal Medicine

## 2020-02-27 ENCOUNTER — Telehealth: Payer: Self-pay

## 2020-02-27 DIAGNOSIS — F411 Generalized anxiety disorder: Secondary | ICD-10-CM

## 2020-02-27 MED ORDER — LORAZEPAM 1 MG PO TABS: 1.0000 mg | ORAL_TABLET | Freq: Three times a day (TID) | ORAL | 1 refills | Status: DC | PRN

## 2020-02-27 NOTE — Telephone Encounter (Signed)
New message    Dr. Jimmy Footman is out of the office on vacation for two weeks and the patient is out of medication.   LORazepam (ATIVAN) 1 MG tablet.  The MD in the practice will not refill her medication for her.   Asking can Dr. Ronnald Ramp refill her medication.  [id number] - [address], [place]

## 2020-02-27 NOTE — Telephone Encounter (Signed)
    Patient calling back for status of refill Advised patient Dr Ronnald Ramp may/ may not be able to write order for medication; allow additional time for Dr Ronnald Ramp to advise

## 2020-02-28 NOTE — Telephone Encounter (Addendum)
Follow up message  Patient requesting order for Ativan  Patient advised medication called to pharmacy

## 2020-02-28 NOTE — Telephone Encounter (Addendum)
Spoke to pt and informed that the rx for lorazepam has been sent in.   Pt stated that she will schedule an appt with Dr. Ronnald Ramp to discuss taking over the medication - Dr. Jimmy Footman is retiring around Christmas this year.

## 2020-03-01 ENCOUNTER — Telehealth: Payer: Self-pay | Admitting: Emergency Medicine

## 2020-03-01 NOTE — Telephone Encounter (Signed)
Called and spoke with pt letting her know that she could get a pulse ox OTC without a prescription and she verbalized understanding. Nothing further needed.

## 2020-03-02 ENCOUNTER — Other Ambulatory Visit (HOSPITAL_COMMUNITY): Payer: Medicare Other

## 2020-03-04 ENCOUNTER — Telehealth: Payer: Self-pay | Admitting: Emergency Medicine

## 2020-03-04 ENCOUNTER — Other Ambulatory Visit: Payer: Self-pay | Admitting: Emergency Medicine

## 2020-03-04 NOTE — Telephone Encounter (Signed)
Duplicate msg.

## 2020-03-04 NOTE — Telephone Encounter (Signed)
Spoke with the pt  She is c/o ear pain bilaterally, "head stopped up", vertigo, chest tightness and cough with yellow/brown sputum x 2 wks  She is unable to check her temp b/c she was in the car but states that she has had chills  She cancelled her PFT for 03/05/20 b/c she feels so bad  She is asking for something to be called in  Used to see ENT but they retired  Please advise thanks! Allergies  Allergen Reactions  . Tetracycline Hives    Patient tolerated Doxycycline Dec 2020  . Hydromorphone Hcl Nausea And Vomiting  . Niacin Other (See Comments)    Mouth blisters  . Niaspan [Niacin Er] Other (See Comments)    Mouth blisters  . Sulfa Antibiotics Nausea Only and Other (See Comments)    "Tears up stomach"  . Sulfonamide Derivatives Other (See Comments)    Reaction: per patient "tears her stomach up"  . Codeine Nausea And Vomiting  . Erythromycin Nausea And Vomiting  . Hydromorphone Hcl Nausea And Vomiting  . Morphine And Related Nausea And Vomiting  . Nalbuphine Nausea And Vomiting  . Sulfasalazine Nausea Only and Other (See Comments)    per patient "tears her stomach up", "Tears up stomach"  . Tape Rash and Other (See Comments)    No "plastic" tape," please

## 2020-03-05 NOTE — Telephone Encounter (Signed)
appt with TP for televisit 03/07/20 at 11:30 was scheduled.

## 2020-03-05 NOTE — Telephone Encounter (Signed)
She needs an acute OV or virtual visit to trouble shoot.

## 2020-03-07 ENCOUNTER — Ambulatory Visit (INDEPENDENT_AMBULATORY_CARE_PROVIDER_SITE_OTHER): Payer: Medicare Other | Admitting: Adult Health

## 2020-03-07 ENCOUNTER — Other Ambulatory Visit: Payer: Self-pay

## 2020-03-07 ENCOUNTER — Encounter: Payer: Self-pay | Admitting: Adult Health

## 2020-03-07 DIAGNOSIS — J4541 Moderate persistent asthma with (acute) exacerbation: Secondary | ICD-10-CM

## 2020-03-07 DIAGNOSIS — I6521 Occlusion and stenosis of right carotid artery: Secondary | ICD-10-CM | POA: Diagnosis not present

## 2020-03-07 DIAGNOSIS — J0100 Acute maxillary sinusitis, unspecified: Secondary | ICD-10-CM | POA: Diagnosis not present

## 2020-03-07 MED ORDER — DOXYCYCLINE HYCLATE 100 MG PO TABS: 100.0000 mg | ORAL_TABLET | Freq: Two times a day (BID) | ORAL | 0 refills | Status: DC

## 2020-03-07 NOTE — Patient Instructions (Addendum)
Doxycycline 100mg  Twice daily  For 1 week , take with food.  Continue on Budesonide and Brovana Twice daily   Albuterol As needed  .  Increase Prednisone 10mg  daily for 1 week then back 5mg  daily  Follow up with Dr. Lamonte Sakai in 6-8 weeks with PFT and As needed   Please contact office for sooner follow up if symptoms do not improve or worsen or seek emergency care

## 2020-03-07 NOTE — Progress Notes (Signed)
Virtual Visit via Telephone Note  I connected with Katherine [name] on 03/07/20 at 11:30 AM EDT by telephone and verified that I am speaking with the correct person using two identifiers.  Location: Patient: Home  Provider: Office    I discussed the limitations, risks, security and privacy concerns of performing an evaluation and management service by telephone and the availability of in person appointments. I also discussed with the patient that there may be a patient responsible charge related to this service. The patient expressed understanding and agreed to proceed.   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  Patient has a complicated medical history is chronically immunosuppressed on Imuran and steroids with prednisone 5 mg daily for previous renal transplant.  Medical history significant for rheumatoid arthritis, fibromyalgia depression and diabetes.  Today's televisit is a acute office visit for 2 weeks of sinus congestion drainage and cough.  Patient says that she has tried her over-the-counter allergy medicines without much relief.  She continues to have ongoing cough which seems to be getting worse.  Has minimal wheezing.  Denies any hemoptysis chest pain orthopnea PND or leg swelling.   Patient was supposed to have pulmonary function testing done this week however says that she did not feel well enough to have this done. Appetite is okay no nausea vomiting or diarrhea. She remains on budesonide and Brovana nebulizer twice daily.   Past Medical History:  Diagnosis Date  . Anemia   . Anxiety   . Bilateral carotid artery stenosis    Carotid duplex 10/5007: 3-81% LICA, 82-99% RICA, >37% RECA, f/u 1 yr suggested  . CAD (coronary artery disease) of bypass graft 5/01; 3/'02, 8/'03, 10/'04; 1/15   PCI x 5 to SVG-D1   . CAD in native  artery 07/1993   3 Vessel Disease (LAD-D1 & RCA) -- CABG (Dx in setting of inferior STEMI-PTCA of RCA)  . CAD S/P percutaneous coronary angioplasty    PCI to SVG-D1 insertion/native D1 x 4 = '01 -(S660 BMS 2.5 x 9 anastomosis- D1); '02 - distal overlap ACS Pixel 2.5 x 8  BMS; '03 distal/native ISR/Thrombosis - Pixel 2.5 x 13; '04 - ISR-  Taxus 2.5 x 20 (covered all);; 1/15 - mid SVG-D1 (50% distal ISR) - Promus P 2.75 x 20 -- 2.8 mm  . COPD mixed type ([place])    Followed by Dr. Lamonte Sakai "pulmonologist said no COPD"  . Depression   . Depression with anxiety   . Diabetes mellitus type 2 in obese ([place])   . Diarrhea    started after cholecystectomy and mass removed from intestine  . Dyslipidemia, goal LDL below 70    08/2012: TC 137, TG 200, HDL 32!, LDL 45; on statin (followed by Dr.Deterding)  . ESRD (end stage renal disease) ([place]) 1991   s/p Cadaveric Renal Transplant [facility] - Dr. Jimmy Footman)   . Family history of adverse reaction to anesthesia    mom's bp dropped during/after anesthesia  . Fibromyalgia   . GERD (gastroesophageal reflux disease)   . Glomerulonephritis, chronic, rapidly progressive 43  . 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 07/1993   Rescue PTCA of RCA -- referred for CABG.  . H/O: GI bleed   . Headache    migraines in the past  . History of CABG x 3 08/1993   Dr. Servando Snare: LIMA-LAD, SVG-bifurcatingD1, SVG-rPDA  .  History of kidney stones   . History of stroke 2012   "right eye stroke- half blind now"  . Hypertension associated with diabetes ([place])   . Mild aortic stenosis by prior echocardiogram 07/2019   Echo:  Mild aortic stenosis (gradients: Mean 14.3 mmHg -peak 24.9 mmHg).  . Morbid obesity ([place])   . MRSA (methicillin resistant staph aureus) culture positive   . OSA (obstructive sleep apnea)    no longer on CPAP or home O2, states she doesn't need now after lap band  . PAD (peripheral artery disease) ([place]) 08/2013   LEA Dopplers to  be read by Dr. Fletcher Anon  . PAF (paroxysmal atrial fibrillation) ([place]) 06/2014   Noted on CardioNet Monitor  - --> rhythm control with Tikosyn (Dr. Rayann Heman); converted from warfarin to apixaban for anticoagulation.  . Pneumonia   . Recurrent boils    Bilateral Groin  . Rheumatoid arthritis ([place])    Per Patient Report; associated with OA  . S/p cadaver renal transplant [place]  . Unstable angina ([place]) 5/01; 3/'02, 8/'03, 10/'04; 1/15   x 5 occurences since Inf-Post STEMI in 1994   Current Outpatient Medications on File Prior to Visit  Medication Sig Dispense Refill  . albuterol (PROVENTIL) (2.5 MG/3ML) 0.083% nebulizer solution Take 3 mLs (2.5 mg total) by nebulization every 6 (six) hours as needed for wheezing or shortness of breath (dx: J45.20). 75 mL 2  . albuterol (VENTOLIN HFA) 108 (90 Base) MCG/ACT inhaler TAKE 2 PUFFS BY MOUTH EVERY 6 HOURS AS NEEDED FOR WHEEZE OR SHORTNESS OF BREATH 18 g 5  . apixaban (ELIQUIS) 5 MG TABS tablet Take 1 tablet (5 mg total) by mouth 2 (two) times daily. 60 tablet 3  . arformoterol (BROVANA) 15 MCG/2ML NEBU Take 2 mLs (15 mcg total) by nebulization 2 (two) times daily. 120 mL 12  . azaTHIOprine (IMURAN) 50 MG tablet Take 125 mg by mouth every evening. Taking 2 & 1/2 tabs (125mg ) qd    . B-D ULTRAFINE III SHORT PEN 31G X 8 MM MISC See admin instructions.    . budesonide (PULMICORT) 0.5 MG/2ML nebulizer solution Take 2 mLs (0.5 mg total) by nebulization 2 (two) times daily. 360 mL 1  . calcitRIOL (ROCALTROL) 0.25 MCG capsule Take 0.25 mcg by mouth every 3 (three) days.     . clotrimazole (MYCELEX) 10 MG troche Take 1 tablet (10 mg total) by mouth 5 (five) times daily. 35 tablet 0  . cyclobenzaprine (FLEXERIL) 10 MG tablet TAKE 1 TABLET BY MOUTH 3 TIMES DAILY AS NEEDED FOR MUSCLE SPASMS (Patient taking differently: Take 10 mg by mouth 2 (two) times daily. ) 90 tablet 2  . diltiazem (CARDIZEM CD) 120 MG 24 hr capsule TAKE 1 CAPSULE BY MOUTH EVERY DAY (Patient  taking differently: Take 120 mg by mouth daily. ) 90 capsule 3  . dofetilide (TIKOSYN) 250 MCG capsule TAKE 1 CAPSULE BY MOUTH TWICE A DAY (Patient taking differently: Take 250 mcg by mouth 2 (two) times daily. TAKE 1 CAPSULE BY MOUTH TWICE A DAY) 180 capsule 2  . DULoxetine (CYMBALTA) 30 MG capsule Take 30 mg by mouth daily.    . ferrous sulfate 325 (65 FE) MG tablet Take 1 tablet (325 mg total) by mouth daily with breakfast. 60 tablet 0  . fluticasone (FLONASE) 50 MCG/ACT nasal spray PLACE 2 SPRAYS INTO BOTH NOSTRILS 2 TIMES DAILY 48 mL 1  . furosemide (LASIX) 40 MG tablet Take 40 mg by mouth 2 (two)  times daily.    [name] Kitchen glimepiride (AMARYL) 4 MG tablet Take 4 mg by mouth daily.    . Insulin Glargine (BASAGLAR KWIKPEN) 100 UNIT/ML SOPN Inject 12 Units into the skin daily.    [name] Kitchen lamoTRIgine (LAMICTAL) 25 MG tablet TAKE 1 TABLET BY MOUTH EVERYDAY AT BEDTIME 90 tablet 0  . LORazepam (ATIVAN) 1 MG tablet Take 1 tablet (1 mg total) by mouth every 8 (eight) hours as needed for anxiety. Takes daily 30 tablet 1  . metFORMIN (GLUCOPHAGE) 500 MG tablet Take 500 mg by mouth 2 (two) times daily.  6  . metoprolol tartrate (LOPRESSOR) 50 MG tablet Take 1 tablet (50 mg total) by mouth 2 (two) times daily. 60 tablet 6  . montelukast (SINGULAIR) 10 MG tablet TAKE 1 TABLET BY MOUTH EVERY DAY (Patient taking differently: Take 10 mg by mouth daily. ) 90 tablet 1  . nitroGLYCERIN (NITROSTAT) 0.4 MG SL tablet Place 1 tablet (0.4 mg total) under the tongue every 5 (five) minutes as needed for chest pain. 25 tablet 6  . NOVOLOG FLEXPEN 100 UNIT/ML FlexPen Inject 4 Units into the skin 2 (two) times daily after a meal.   6  . omeprazole (PRILOSEC) 20 MG capsule Take 20 mg by mouth daily.    Glory Rosebush ULTRA test strip USE AS DIRECTED 3 TIMES DAILY TO CHECK BLOOD SUGAR    . Oxycodone HCl 10 MG TABS Take 1 tablet (10 mg total) by mouth every 8 (eight) hours as needed. 70 tablet 0  . predniSONE (DELTASONE) 5 MG tablet Take 5 mg  by mouth daily.    . promethazine (PHENERGAN) 25 MG tablet Take 25 mg by mouth at bedtime. Or for sleep    . rosuvastatin (CRESTOR) 20 MG tablet Take 1 tablet (20 mg total) by mouth daily. 90 tablet 3  . sertraline (ZOLOFT) 100 MG tablet Take 200 mg by mouth at bedtime.      No current facility-administered medications on file prior to visit.      Observations/Objective: She speaks in full sentences with no audible distress or wheezing  Assessment and Plan: Acute asthmatic bronchitic exacerbation +/-early sinusitis. Patient has multiple medication allergies and antibiotic intolerance.  Says that she can tolerate doxycycline well.  Plan  Patient Instructions  Doxycycline 100mg  Twice daily  For 1 week , take with food.  Continue on Budesonide and Brovana Twice daily   Albuterol As needed  .  Increase Prednisone 10mg  daily for 1 week then back 5mg  daily  Follow up with Dr. Lamonte Sakai in 6-8 weeks with PFT and As needed   Please contact office for sooner follow up if symptoms do not improve or worsen or seek emergency care       Follow Up Instructions: Follow-up in 6 weeks and as needed We will reschedule PFTs   I discussed the assessment and treatment plan with the patient. The patient was provided an opportunity to ask questions and all were answered. The patient agreed with the plan and demonstrated an understanding of the instructions.   The patient was advised to call back or seek an in-person evaluation if the symptoms worsen or if the condition fails to improve as anticipated.  I provided 22  minutes of non-face-to-face time during this encounter.   Rexene Edison, NP

## 2020-03-12 ENCOUNTER — Other Ambulatory Visit: Payer: Self-pay | Admitting: *Deleted

## 2020-03-12 ENCOUNTER — Encounter: Payer: Self-pay | Admitting: *Deleted

## 2020-03-12 NOTE — Patient Outreach (Signed)
[facility]) Care Management [place] discharge day # 74 without unplanned hospital re-admission  03/12/2020  [name] Jul 25, 1956 [id number]  Successful telephone outreachto Katherine [name], 64 y/o female referred to [facility] RN CM5/20/21by [place] for EMMI Red-Alert notification after hospitalization May 10-14, 2060for shortness of breath/ malaise/ CHF exacerbation that occurred after she received her corona virus vaccine. She was discharged home to self-care with ongoing home health services for RN/ PT/ OT. Patient has history including, but not limited to, HTN/ HLD; PAD; paroxysmal Atrial Fibrillation; MI with CABG x 3; DM II; renal transplant; fibromyalgia; CHF/ OSA; morbid obesity with previous lap-banding surgery; and CKD.  HIPAA/ identity verified; patient immediately reports today that she is having symptoms of a "stomach bug," and she does not feel like talking today; she requests call back "this time next week;" and when questioned, states that she just wants to lie down- she declines my offer to provide assistance/ triage around her stated symptoms stating that she thinks she "will be fine," but "just wants to lie down."  Contracted verbally with patient that I would contact her next week as requested and encouraged her to contact me/ care providers sooner should she need assistance in managing her stated symptoms- she is agreeable  Plan:  Will re-attempt call to patient next week unless indicated sooner  Katherine Rack, RN, BSN, [employer] Coordinator [facility] Care Management  [phone]

## 2020-03-20 ENCOUNTER — Encounter: Payer: Self-pay | Admitting: *Deleted

## 2020-03-20 ENCOUNTER — Other Ambulatory Visit: Payer: Self-pay | Admitting: *Deleted

## 2020-03-20 NOTE — Patient Outreach (Signed)
[facility]) Care Management [facility] CM Telephone Outreach Post-hospital discharge day # 82 Unsuccessful (consecutive) outreach attempt # 1- previously (partially) engaged patient  03/20/2020  Lori-Ann Lindfors 1956/07/22 [id number]  Unsuccessful telephone [place], 64 y/o female referred to [facility] RN CM5/20/21by [place] for EMMI Red-Alert notificationafterhospitalizationMay 10-14, 2089for shortness of breath/ malaise/ CHF exacerbation that occurred after she received her corona virus vaccine. She was discharged home to self-care with ongoing home health services for RN/ PT/ OT. Patient has history including, but not limited to, HTN/ HLD; PAD; paroxysmal Atrial Fibrillation; MI with CABG x 3; DM II; renal transplant; fibromyalgia; CHF/ OSA; morbid obesity with previous lap-banding surgery; and CKD.  HIPAA compliant voice mail message left for patient, requesting return call back.  Plan:  Will re-attempt [facility] CM telephone outreach within 4 business days if I do not hear back from patient first  Oneta Rack, RN, BSN, [employer] Coordinator [facility] Care Management  [phone]

## 2020-03-25 ENCOUNTER — Encounter: Payer: Self-pay | Admitting: *Deleted

## 2020-03-25 ENCOUNTER — Other Ambulatory Visit: Payer: Self-pay | Admitting: *Deleted

## 2020-03-25 NOTE — Patient Outreach (Signed)
[facility]) Care Management [facility] CM Telephone Outreach Post-hospital discharge day # 87 Unsuccessful (consecutive) outreach attempt # 2- previously (partially) engaged patient  03/25/2020  Katherine [name] [birth year] [id number]  Unsuccessful (consecutive) second telephone outreachattempt to Katherine [name], 64 y/o female referred to [place] CM5/20/21by [place] for EMMI Red-Alert notificationafterhospitalizationMay 10-14, 2029for shortness of breath/ malaise/ CHF exacerbation that occurred after she received her corona virus vaccine. She was discharged home to self-care with ongoing home health services for RN/ PT/ OT. Patient has history including, but not limited to, HTN/ HLD; PAD; paroxysmal Atrial Fibrillation; MI with CABG x 3; DM II; renal transplant; fibromyalgia; CHF/ OSA; morbid obesity with previous lap-banding surgery; and CKD.  HIPAA compliant voice mail message left for patient, requesting return call back.  Plan:  Will re-attempt [facility] CM telephone outreach within 4 business days if I do not hear back from patient first  [facility] CM unsuccessful patient outreach letter in mail requesting call back in writing, as this is the second consecutive unsuccessful outreach attempt in a patient who was previously partially engaged with [facility] CM program  Oneta Rack, RN, BSN, [employer] Coordinator [facility] Care Management  [phone]

## 2020-03-27 ENCOUNTER — Encounter: Payer: Medicare Other | Attending: Physical Medicine & Rehabilitation | Admitting: Physical Medicine & Rehabilitation

## 2020-03-27 ENCOUNTER — Encounter: Payer: Self-pay | Admitting: Physical Medicine & Rehabilitation

## 2020-03-27 ENCOUNTER — Other Ambulatory Visit: Payer: Self-pay

## 2020-03-27 VITALS — BP 144/73 | HR 62 | Temp 98.9°F | Ht 61.0 in | Wt 215.0 lb

## 2020-03-27 DIAGNOSIS — M797 Fibromyalgia: Secondary | ICD-10-CM | POA: Insufficient documentation

## 2020-03-27 DIAGNOSIS — M25511 Pain in right shoulder: Secondary | ICD-10-CM | POA: Insufficient documentation

## 2020-03-27 DIAGNOSIS — Z79891 Long term (current) use of opiate analgesic: Secondary | ICD-10-CM | POA: Insufficient documentation

## 2020-03-27 DIAGNOSIS — M25512 Pain in left shoulder: Secondary | ICD-10-CM | POA: Insufficient documentation

## 2020-03-27 DIAGNOSIS — M4726 Other spondylosis with radiculopathy, lumbar region: Secondary | ICD-10-CM

## 2020-03-27 DIAGNOSIS — E1142 Type 2 diabetes mellitus with diabetic polyneuropathy: Secondary | ICD-10-CM | POA: Diagnosis not present

## 2020-03-27 DIAGNOSIS — I6521 Occlusion and stenosis of right carotid artery: Secondary | ICD-10-CM | POA: Diagnosis not present

## 2020-03-27 DIAGNOSIS — G8929 Other chronic pain: Secondary | ICD-10-CM | POA: Diagnosis not present

## 2020-03-27 DIAGNOSIS — M1711 Unilateral primary osteoarthritis, right knee: Secondary | ICD-10-CM

## 2020-03-27 DIAGNOSIS — Z5181 Encounter for therapeutic drug level monitoring: Secondary | ICD-10-CM | POA: Insufficient documentation

## 2020-03-27 MED ORDER — SERTRALINE HCL 50 MG PO TABS
50.0000 mg | ORAL_TABLET | Freq: Every day | ORAL | 5 refills | Status: DC
Start: 2020-03-27 — End: 2020-04-12

## 2020-03-27 MED ORDER — OXYCODONE HCL 10 MG PO TABS: 10.0000 mg | ORAL_TABLET | Freq: Three times a day (TID) | ORAL | 0 refills | Status: DC | PRN

## 2020-03-27 NOTE — Patient Instructions (Signed)
PLEASE FEEL FREE TO CALL OUR OFFICE WITH ANY PROBLEMS OR QUESTIONS ([phone])      

## 2020-03-27 NOTE — Progress Notes (Signed)
Subjective:    Patient ID: Katherine [name], female    DOB: 05-Aug-1956, 64 y.o.   MRN: [id number]  HPI   [name] is here in follow up of her chronic pain. We reduced cymbalta last month via phone d/t concerns over dizziness, confusion. When she did reduce the cymbalta her anxiety increased, so she returned to 46m daily.   She is using oxycodone for pain control. She takes 120mq8 prn. Our NP increased her # to [id number] has helped when she has a bad day with pain.   From a medical standpoint she remains on oxygen for her COPD. She feels limited by how long she can go with her portable tank. She is planning to go to the beach this fall. No other health changes were noted today.      Pain Inventory Average Pain 8 Pain Right Now 8 My pain is sharp, burning, stabbing, tingling and aching  In the last 24 hours, has pain interfered with the following? General activity 9 Relation with others 9 Enjoyment of life 9 What TIME of day is your pain at its worst? morning, night  Sleep (in general) Fair  Pain is worse with: walking, bending, standing and some activites Pain improves with: rest, heat/ice and injections Relief from Meds: 9  Mobility walk with assistance use a cane  Function disabled: date disabled .  Neuro/Psych numbness tingling trouble walking spasms dizziness depression anxiety  Prior Studies Any changes since last visit?  no  Physicians involved in your care Any changes since last visit?  no   Family History  Problem Relation Age of Onset  . Cancer Mother        liver  . Heart disease Father   . Cancer Father        colon  . Arrhythmia Brother        Atrial Fibrillation  . Arrhythmia Paternal Aunt        Atrial Fibrillation   Social History   Socioeconomic History  . Marital status: Widowed    Spouse name: Not on file  . Number of children: Not on file  . Years of education: Not on file  . Highest education level: Not on file    Occupational History  . Not on file  Tobacco Use  . Smoking status: Former Smoker    Packs/day: 1.00    Years: 30.00    Pack years: 30.00    Types: Cigarettes    Quit date: 08/17/2002    Years since quitting: 17.6  . Smokeless tobacco: Never Used  Vaping Use  . Vaping Use: Never used  Substance and Sexual Activity  . Alcohol use: No  . Drug use: No  . Sexual activity: Not on file  Other Topics Concern  . Not on file  Social History Narrative   She is currently married, and the caregiver of her husband who is recovering from surgery for tongue cancer now diagnosed with lung cancer. Prior to his diagnosis of her husband, she actually had adopted a 5-71ear-old child who she knows caring for as well. With all the surgeries, they have been quite financially troubled. Thanks the help of her community and church, they have been able to stay "alfoat."     She is a former smoker who quit in 2004 after a 30-pack-year history.   She is active chasing a 5-64ear-old child, does not do routine exercise. She's been quite depressed with the condition of her husband, and admits to  eating comfort herself.   She does not drink alcohol.   Social Determinants of Health   Financial Resource Strain:   . Difficulty of Paying Living Expenses:   Food Insecurity: No Food Insecurity  . Worried About Charity fundraiser in the Last Year: Never true  . Ran Out of Food in the Last Year: Never true  Transportation Needs: No Transportation Needs  . Lack of Transportation (Medical): No  . Lack of Transportation (Non-Medical): No  Physical Activity:   . Days of Exercise per Week:   . Minutes of Exercise per Session:   Stress:   . Feeling of Stress :   Social Connections:   . Frequency of Communication with Friends and Family:   . Frequency of Social Gatherings with Friends and Family:   . Attends Religious Services:   . Active Member of Clubs or Organizations:   . Attends Archivist Meetings:   [name] Kitchen  Marital Status:    Past Surgical History:  Procedure Laterality Date  . ABDOMINAL AORTOGRAM N/A 04/21/2018   Procedure: ABDOMINAL AORTOGRAM;  Surgeon: [name], MD;  Location: [place] [facility];  Service: Cardiovascular;  Laterality: N/A;  . CATHETER REMOVAL    . CHOLECYSTECTOMY N/A 10/29/2014   Procedure: LAPAROSCOPIC CHOLECYSTECTOMY WITH INTRAOPERATIVE CHOLANGIOGRAM;  Surgeon: Excell Seltzer, MD;  Location: [facility];  Service: General;  Laterality: N/A;  . CORONARY ANGIOPLASTY  1994   x5  . CORONARY ARTERY BYPASS GRAFT  1995   LIMA-LAD, SVG-RPDA, SVG-D1  . ESOPHAGOGASTRODUODENOSCOPY N/A 10/15/2016   Procedure: ESOPHAGOGASTRODUODENOSCOPY (EGD);  Surgeon: Wilford Corner, MD;  Location: [facility];  Service: Endoscopy;  Laterality: N/A;  . I & D EXTREMITY Right 01/29/2018   Procedure: IRRIGATION AND DEBRIDEMENT THUMB;  Surgeon: Dayna Barker, MD;  Location: [place];  Service: Plastics;  Laterality: Right;  . INCISE AND DRAIN ABCESS    . KIDNEY TRANSPLANT  1991  . KNEE ARTHROSCOPY WITH LATERAL MENISECTOMY Left 12/03/2017   Procedure: LEFT KNEE ARTHROSCOPY WITH LATERAL MENISECTOMY;  Surgeon: Earlie Server, MD;  Location: [place];  Service: Orthopedics;  Laterality: Left;  . LAPAROSCOPIC GASTRIC BANDING  04/2004; 10/'09, 2/'10   Port Replacement x 2  . LEFT HEART CATH AND CORS/GRAFTS ANGIOGRAPHY N/A 04/21/2018   Procedure: LEFT HEART CATH AND CORS/GRAFTS ANGIOGRAPHY;  Surgeon: [name], MD;  Location: [place] [facility];  Ost-Prox LAD 50% - proxLAD (pre & post D1) 100% CTO. Cx - patent, small OM1 (stable ~ ostial OM1 90%, too small for PCI) & 2 small LPL; Ost-distal RCA 100% CTO.  LIMA-LAD (not injected); SVG-dRCA patent, SVG-D1 - insertion stent ~20% ISR - Severe R CFA disease w/ focal Sub TO  . LEFT HEART CATH AND CORS/GRAFTS ANGIOGRAPHY  5/'01, 3/'02, 8/'03, 10/'04; 1/'15   08/22/2013: LAD & RCA 100%; LIMA-LAD & SVG-rPDA patent; Cx-- OM1 60%, OM2 ostial ~50%; SVG-D1 - 80% mid, 50%  distal ISR --PCI  . LEFT HEART CATHETERIZATION WITH CORONARY/GRAFT ANGIOGRAM N/A 08/23/2013   Procedure: LEFT HEART CATHETERIZATION WITH Beatrix Fetters;  Surgeon: Wellington Hampshire, MD;  Location: [place] C[facility];  Service: Cardiovascular;  Laterality: N/A;  . Lower Extremity Arterial Dopplers  08/2013   ABI: R 0.96, L 1.04  . MULTIPLE TOOTH EXTRACTIONS  age 49  . NM MYOVIEW LTD  03/2016   EF 62%. LOW RISK. C/W prior MI - no Ischemia. Apical hypokinesis.  [name] Kitchen PERCUTANEOUS CORONARY STENT INTERVENTION (PCI-S)  5/'01, 3/'02, 8/'03, 10/'04;   '01 - S660  BMS 2.5 x 9 - dSVG-D1 into D1; '02- post-stent stenosis - 2.5 x 8 Pixel BMS; '8\03: ISR/Thrombosis into native D1 - AngioJet, 2.5 x 13 Pixel; '04 - ISR 95% - covered stented area with Taxus DES 2.5 mm x 20 (2.88)  . PERCUTANEOUS CORONARY STENT INTERVENTION (PCI-S)  08/23/2013   Procedure: PERCUTANEOUS CORONARY STENT INTERVENTION (PCI-S);  Surgeon: Wellington Hampshire, MD;  Location: [facility] CATH LAB;  Service: Cardiovascular;;mid SVG-D1 80%; distal stent ~50% ISR; Promus Prermier DES 2.75 mm xc 20 mm (2.8 mm)  . PORT-A-CATH REMOVAL     kidney  . TRANSTHORACIC ECHOCARDIOGRAM  07/2019   EF 55 to 60%.  No LVH.  Paradoxical septal wall motion-post CABG.  GRII DD.  Normal RV size and function.  Mild bilateral atrial dilation.  Moderate MAC.  Trace MR.  Mild aortic stenosis (gradients: Mean 14.3 mmHg -peak 24.9 mmHg).  . TUBAL LIGATION    . wrist fistula repair Left    dialysis for one year   Past Medical History:  Diagnosis Date  . Anemia   . Anxiety   . Bilateral carotid artery stenosis    Carotid duplex 0/3009: 2-33% LICA, 00-76% RICA, >22% RECA, f/u 1 yr suggested  . CAD (coronary artery disease) of bypass graft 5/01; 3/'02, 8/'03, 10/'04; 1/15   PCI x 5 to SVG-D1   . CAD in native artery 07/1993   3 Vessel Disease (LAD-D1 & RCA) -- CABG (Dx in setting of inferior STEMI-PTCA of RCA)  . CAD S/P percutaneous coronary angioplasty    PCI to SVG-D1  insertion/native D1 x 4 = '01 -(S660 BMS 2.5 x 9 anastomosis- D1); '02 - distal overlap ACS Pixel 2.5 x 8  BMS; '03 distal/native ISR/Thrombosis - Pixel 2.5 x 13; '04 - ISR-  Taxus 2.5 x 20 (covered all);; 1/15 - mid SVG-D1 (50% distal ISR) - Promus P 2.75 x 20 -- 2.8 mm  . COPD mixed type ([place])    Followed by Dr. Lamonte Sakai "pulmonologist said no COPD"  . Depression   . Depression with anxiety   . Diabetes mellitus type 2 in obese ([place])   . Diarrhea    started after cholecystectomy and mass removed from intestine  . Dyslipidemia, goal LDL below 70    08/2012: TC 137, TG 200, HDL 32!, LDL 45; on statin (followed by Dr.Deterding)  . ESRD (end stage renal disease) ([place]) 1991   s/p Cadaveric Renal Transplant [facility] - Dr. Jimmy Footman)   . Family history of adverse reaction to anesthesia    mom's bp dropped during/after anesthesia  . Fibromyalgia   . GERD (gastroesophageal reflux disease)   . Glomerulonephritis, chronic, rapidly progressive 25  . 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 07/1993   Rescue PTCA of RCA -- referred for CABG.  . H/O: GI bleed   . Headache    migraines in the past  . History of CABG x 3 08/1993   Dr. Servando Snare: LIMA-LAD, SVG-bifurcatingD1, SVG-rPDA  . History of kidney stones   . History of stroke 2012   "right eye stroke- half blind now"  . Hypertension associated with diabetes ([place])   . Mild aortic stenosis by prior echocardiogram 07/2019   Echo:  Mild aortic stenosis (gradients: Mean 14.3 mmHg -peak 24.9 mmHg).  . Morbid obesity ([place])   . MRSA (methicillin resistant staph aureus) culture positive   . OSA (obstructive sleep apnea)    no longer on CPAP or home O2, states she doesn't need now  after lap band  . PAD (peripheral artery disease) ([place]) 08/2013   LEA Dopplers to be read by Dr. Fletcher Anon  . PAF (paroxysmal atrial fibrillation) ([place]) 06/2014   Noted on CardioNet Monitor  - --> rhythm control with Tikosyn (Dr. Rayann Heman); converted from  warfarin to apixaban for anticoagulation.  . Pneumonia   . Recurrent boils    Bilateral Groin  . Rheumatoid arthritis ([place])    Per Patient Report; associated with OA  . S/p cadaver renal transplant [place]  . Unstable angina ([place]) 5/01; 3/'02, 8/'03, 10/'04; 1/15   x 5 occurences since Inf-Post STEMI in 1994   BP (!) 144/73   Pulse 62   Temp 98.9 F (37.2 C)   Ht _0  (1.549 m)   Wt 215 lb (97.5 kg)   SpO2 96%   BMI 40.62 kg/m   Opioid Risk Score:   Fall Risk Score:  `1  Depression screen PHQ 2/9  Depression screen [facility] 2/9 01/04/2020 11/02/2018 07/04/2018  Decreased Interest 0 3 3  Down, Depressed, Hopeless _1 PHQ - 2 Score _2 Altered sleeping - 1 3  Tired, decreased energy - 1 3  Change in appetite - 1 2  Feeling bad or failure about yourself  - 0 3  Trouble concentrating - 0 2  Moving slowly or fidgety/restless - 0 0  Suicidal thoughts - 0 0  PHQ-9 Score - 9 19  Difficult doing work/chores - - Very difficult  Some recent data might be hidden    Review of Systems  Constitutional: Negative.   Respiratory: Positive for cough, shortness of breath and wheezing.   Cardiovascular: Negative.   Gastrointestinal: Negative.   Endocrine: Negative.   Genitourinary: Negative.   Musculoskeletal: Positive for arthralgias, back pain, gait problem, myalgias, neck pain and neck stiffness.  Skin: Negative.   Allergic/Immunologic: Negative.   Neurological: Positive for dizziness, light-headedness and numbness.       Tingling Balance control problems   Psychiatric/Behavioral: Positive for dysphoric mood. The patient is nervous/anxious.   All other systems reviewed and are negative.      Objective:   Physical Exam Gen: no distress, normal appearing. Has gained some weight HEENT: oral mucosa pink and moist, NCAT Cardio: Reg rate Chest: normal effort, normal rate of breathing Abd: soft, non-distended Ext: no edema Skin: intact Neuro: distal lower ext sensory  loss. UE 4/5. LE 4-/5 proximal to distally with pain inhibition Musculoskeletal: multiple joint pain. Antalgic right lower ext. Using pain Psych: pleasant, normal affect        Assessment & Plan:  1. Fibromyalgia:               - Continue Tegretol and Cymbalta (39m)              -continue exercise to tolerance 2. Rheumatoid Arthritis: Refilled: Oxycodone 125mone tablet every 8 hours as needed for severe pain #70. RF today. i'm ok with the current amount. Discussed long term picture as it pertains to her pain medications.               We will continue the controlled substance monitoring program, this consists of regular clinic visits, examinations, routine drug screening, pill counts as well [place].   3. Osteoarthritis--polyarticular:               4. ESRD with hx of renal transplant on  chronic steroids:              -Nephrology following 5. Right rotator cuff tendonitis/like degenerative/inflammatory changes in the right shoulder also.              -HEP was advised 6. GERD:             -resume pepcid             -cut back on OJ.   7. Depression:              -Continue current medication regimen. Continue to monitor.   8. Lumbar Radiculitis: previous  Left L4-5 Transformanial ESI with relief   9. Bilateral  Hip Pain:              -HEP as previously discussed 10. Type 2 DM with Peripheral Neuropathy             -lamictal  Fifteen minutes of face to face patient care time were spent during this visit. All questions were encouraged and answered.  Follow up with me in 2 mos .

## 2020-03-28 ENCOUNTER — Telehealth: Payer: Self-pay | Admitting: *Deleted

## 2020-03-28 DIAGNOSIS — M797 Fibromyalgia: Secondary | ICD-10-CM

## 2020-03-28 MED ORDER — OXYCODONE HCL 10 MG PO TABS: 10.0000 mg | ORAL_TABLET | Freq: Three times a day (TID) | ORAL | 0 refills | Status: DC | PRN

## 2020-03-28 MED FILL — oxyCODONE HCL 10 MG TABS: 10 | 23 days supply | Qty: 70 | Fill #0

## 2020-03-28 NOTE — Telephone Encounter (Signed)
[facility] out [facility] ox[facility]one 10 mg #70.  I have cancelled it there and verified [facility] has it. Please send there.

## 2020-03-28 NOTE — Telephone Encounter (Signed)
PMP was reviewed: Oxycodone e-scribed. Sybil RN will call Ms. Rhinehart regarding the above.

## 2020-03-29 ENCOUNTER — Encounter: Payer: Self-pay | Admitting: *Deleted

## 2020-03-29 ENCOUNTER — Other Ambulatory Visit: Payer: Self-pay | Admitting: *Deleted

## 2020-03-29 ENCOUNTER — Other Ambulatory Visit: Payer: Self-pay | Admitting: Cardiology

## 2020-03-29 NOTE — Telephone Encounter (Signed)
Prescription refill request for Eliquis received. Indication: Atrial Fibrillation Last office visit: 12/06/2019 Ellyn Hack Scr: 0.74 12/28/2019 Age: 64 Weight: 97.5 kg  Prescription refilled

## 2020-03-29 NOTE — Patient Outreach (Signed)
[facility]) Care Management [facility] CM Telephone Outreach Post-hospital discharge day # 91  Unsuccessful (consecutive) outreach attempt # 3- partially engaged patient  03/29/2020  Katherine [name] Feb 27, 1956 [id number]  Unsuccessful (consecutive) third telephone outreachattempt to [name], 64 y/o female referred to [facility] CM5/20/21by [place] for EMMI Red-Alert notificationafterhospitalizationMay 10-14, 2022for shortness of breath/ malaise/ CHF exacerbation that occurred after she received her corona virus vaccine. She was discharged home to self-care with ongoing home health services for RN/ PT/ OT. Patient has history including, but not limited to, HTN/ HLD; PAD; paroxysmal Atrial Fibrillation; MI with CABG x 3; DM II; renal transplant; fibromyalgia; CHF/ OSA; morbid obesity with previous lap-banding surgery; and CKD.  HIPAA compliant voice mail message left for patient, requesting return call back.  Plan:  Will re-attempt [facility] CM telephone outreachin 3 weeksif I do not hear back from patient first  Verified [facility] CM unsuccessful patient outreach letter placed in mail 03/25/20 requesting call back in writing  Oneta Rack, RN, BSN, [place] Coordinator [facility] Care Management  [phone]

## 2020-04-11 ENCOUNTER — Telehealth: Payer: Self-pay | Admitting: Cardiology

## 2020-04-11 ENCOUNTER — Telehealth (HOSPITAL_COMMUNITY): Payer: Self-pay | Admitting: *Deleted

## 2020-04-11 NOTE — Telephone Encounter (Signed)
lvm for patient to return call to get follow up scheduled with Ellyn Hack from recall list

## 2020-04-11 NOTE — Telephone Encounter (Signed)
Pt called in with complaint of near syncope spell and shortness of breath yesterday when she checked her HR it was 48.  Today heart rates are back to normal offered appt today for assessment pt refused finally agreed to appt tomorrow. ER precautions reviewed with patient.

## 2020-04-12 ENCOUNTER — Ambulatory Visit (HOSPITAL_COMMUNITY)
Admission: RE | Admit: 2020-04-12 | Discharge: 2020-04-12 | Disposition: A | Discharge status: Home / Self Care | Payer: Medicare Other | Source: Ambulatory Visit | Attending: Nurse Practitioner | Admitting: Nurse Practitioner

## 2020-04-12 ENCOUNTER — Other Ambulatory Visit: Payer: Self-pay

## 2020-04-12 VITALS — BP 150/76 | HR 53 | Ht 61.0 in | Wt 221.4 lb

## 2020-04-12 DIAGNOSIS — J449 Chronic obstructive pulmonary disease, unspecified: Secondary | ICD-10-CM | POA: Diagnosis not present

## 2020-04-12 DIAGNOSIS — E785 Hyperlipidemia, unspecified: Secondary | ICD-10-CM | POA: Diagnosis not present

## 2020-04-12 DIAGNOSIS — E119 Type 2 diabetes mellitus without complications: Secondary | ICD-10-CM | POA: Diagnosis not present

## 2020-04-12 DIAGNOSIS — Z79899 Other long term (current) drug therapy: Secondary | ICD-10-CM | POA: Diagnosis not present

## 2020-04-12 DIAGNOSIS — Z794 Long term (current) use of insulin: Secondary | ICD-10-CM | POA: Diagnosis not present

## 2020-04-12 DIAGNOSIS — R55 Syncope and collapse: Secondary | ICD-10-CM | POA: Insufficient documentation

## 2020-04-12 DIAGNOSIS — Z955 Presence of coronary angioplasty implant and graft: Secondary | ICD-10-CM | POA: Diagnosis not present

## 2020-04-12 DIAGNOSIS — I2510 Atherosclerotic heart disease of native coronary artery without angina pectoris: Secondary | ICD-10-CM | POA: Diagnosis not present

## 2020-04-12 DIAGNOSIS — I480 Paroxysmal atrial fibrillation: Secondary | ICD-10-CM

## 2020-04-12 DIAGNOSIS — Z7952 Long term (current) use of systemic steroids: Secondary | ICD-10-CM | POA: Diagnosis not present

## 2020-04-12 DIAGNOSIS — Z8614 Personal history of Methicillin resistant Staphylococcus aureus infection: Secondary | ICD-10-CM | POA: Insufficient documentation

## 2020-04-12 DIAGNOSIS — Z940 Kidney transplant status: Secondary | ICD-10-CM | POA: Insufficient documentation

## 2020-04-12 DIAGNOSIS — R42 Dizziness and giddiness: Secondary | ICD-10-CM | POA: Diagnosis not present

## 2020-04-12 DIAGNOSIS — Z8673 Personal history of transient ischemic attack (TIA), and cerebral infarction without residual deficits: Secondary | ICD-10-CM | POA: Diagnosis not present

## 2020-04-12 DIAGNOSIS — Z87891 Personal history of nicotine dependence: Secondary | ICD-10-CM | POA: Diagnosis not present

## 2020-04-12 DIAGNOSIS — Z6841 Body Mass Index (BMI) 40.0 and over, adult: Secondary | ICD-10-CM | POA: Diagnosis not present

## 2020-04-12 DIAGNOSIS — F418 Other specified anxiety disorders: Secondary | ICD-10-CM | POA: Insufficient documentation

## 2020-04-12 DIAGNOSIS — G8929 Other chronic pain: Secondary | ICD-10-CM | POA: Insufficient documentation

## 2020-04-12 DIAGNOSIS — Z951 Presence of aortocoronary bypass graft: Secondary | ICD-10-CM | POA: Insufficient documentation

## 2020-04-12 LAB — BASIC METABOLIC PANEL
Anion gap: 14 (ref 5–15)
BUN: 23 mg/dL (ref 8–23)
CO2: 25 mmol/L (ref 22–32)
Calcium: 9.7 mg/dL (ref 8.9–10.3)
Chloride: 97 mmol/L — ABNORMAL LOW (ref 98–111)
Creatinine, Ser: 1.16 mg/dL — ABNORMAL HIGH (ref 0.44–1.00)
GFR calc Af Amer: 58 mL/min — ABNORMAL LOW (ref >60)
GFR calc non Af Amer: 50 mL/min — ABNORMAL LOW (ref >60)
Glucose, Bld: 219 mg/dL — ABNORMAL HIGH (ref 70–99)
Potassium: 4.4 mmol/L (ref 3.5–5.1)
Sodium: 136 mmol/L (ref 135–145)

## 2020-04-12 LAB — MAGNESIUM: Magnesium: 1.8 mg/dL (ref 1.7–2.4)

## 2020-04-12 LAB — CBC
HCT: 35.1 % — ABNORMAL LOW (ref 36.0–46.0)
Hemoglobin: 11 g/dL — ABNORMAL LOW (ref 12.0–15.0)
MCH: 30.1 pg (ref 26.0–34.0)
MCHC: 31.3 g/dL (ref 30.0–36.0)
MCV: 96.2 fL (ref 80.0–100.0)
Platelets: 225 10*3/uL (ref 150–400)
RBC: 3.65 MIL/uL — ABNORMAL LOW (ref 3.87–5.11)
RDW: 15.1 % (ref 11.5–15.5)
WBC: 10 10*3/uL (ref 4.0–10.5)
nRBC: 0 % (ref 0.0–0.2)

## 2020-04-12 MED ORDER — SERTRALINE HCL 50 MG PO TABS
100.0000 mg | ORAL_TABLET | Freq: Every day | ORAL | Status: DC
Start: 2020-04-12 — End: 2020-07-11

## 2020-04-12 MED ORDER — DILTIAZEM HCL 30 MG PO TABS
ORAL_TABLET | ORAL | 1 refills | Status: DC
Start: 2020-04-12 — End: 2020-06-03

## 2020-04-12 NOTE — Progress Notes (Addendum)
Primary Care Physician: [name], MD Referring Physician:[facility] f/u EP: Dr. Rayann Heman Cardiologist: Dr. Edwina Barth Katherine [name] is a 64 y.o. female with a h/o coronary artery disease status post CABG and subsequent stents, depression with anxiety, chronic pain, renal transplant recipient, paroxysmal atrial fibrillation, on tikosyn and eliquis for a CHA2DS2VASc of at least 5 and insulin-dependent diabetes mellitus.   F/u in afib clinic, 05/10/19, for f/u Tikosyn load. She remained in S brady at 55 bpm, qtc stable 461 ms.  F/u in afib clinic, 11/16/19 for Tikosyn surveillance. She reports staying in SR. Feels a few flutters in the pm. She has increased her Metoprolol from 25 mg bid to 50 mg bid as she feels less flutters this way. She had bmet drawn at Nephrologist mid March, labs reviewed with creatinine at 0.73 and K+ 4.7. Magnesium to be drawn today. She remains on warfarin with a CHA2DS2VASc score of at least 3.   F/u in afib clinic, 8/27. Pt asked to be seen as she has not felt well lately. She states that she has felt that she is going in and out of afib. Because of this, she had been taking extra tikosyn tablets intermittently. This past Wednesday, she got up at lunch to get food. When she sat back down, she felt hot,sweaty and her vision went black. She felt dizzy. She lowered her head to keep from passing out and called her sister. This  lasted in just a few minutes and she has not had a repeat episode  She is in SR today.   Today, she denies symptoms of palpitations, chest pain, shortness of breath, orthopnea, PND, lower extremity edema, dizziness, presyncope, syncope, or neurologic sequela. The patient is tolerating medications without difficulties and is otherwise without complaint today.   Past Medical History:  Diagnosis Date  . Anemia   . Anxiety   . Bilateral carotid artery stenosis    Carotid duplex 11/100: 7-25% LICA, 36-64% RICA, >40% RECA, f/u 1 yr suggested  . CAD  (coronary artery disease) of bypass graft 5/01; 3/'02, 8/'03, 10/'04; 1/15   PCI x 5 to SVG-D1   . CAD in native artery 07/1993   3 Vessel Disease (LAD-D1 & RCA) -- CABG (Dx in setting of inferior STEMI-PTCA of RCA)  . CAD S/P percutaneous coronary angioplasty    PCI to SVG-D1 insertion/native D1 x 4 = '01 -(S660 BMS 2.5 x 9 anastomosis- D1); '02 - distal overlap ACS Pixel 2.5 x 8  BMS; '03 distal/native ISR/Thrombosis - Pixel 2.5 x 13; '04 - ISR-  Taxus 2.5 x 20 (covered all);; 1/15 - mid SVG-D1 (50% distal ISR) - Promus P 2.75 x 20 -- 2.8 mm  . COPD mixed type ([place])    Followed by Dr. Lamonte Sakai "pulmonologist said no COPD"  . Depression   . Depression with anxiety   . Diabetes mellitus type 2 in obese ([place])   . Diarrhea    started after cholecystectomy and mass removed from intestine  . Dyslipidemia, goal LDL below 70    08/2012: TC 137, TG 200, HDL 32!, LDL 45; on statin (followed by Dr.Deterding)  . ESRD (end stage renal disease) ([place]) 1991   s/p Cadaveric Renal Transplant [facility] - Dr. Jimmy Footman)   . Family history of adverse reaction to anesthesia    mom's bp dropped during/after anesthesia  . Fibromyalgia   . GERD (gastroesophageal reflux disease)   . Glomerulonephritis, chronic, rapidly progressive 63  . H/O ST elevation  myocardial infarction (STEMI) of inferoposterior wall 07/1993   Rescue PTCA of RCA -- referred for CABG.  . H/O: GI bleed   . Headache    migraines in the past  . History of CABG x 3 08/1993   Dr. Servando Snare: LIMA-LAD, SVG-bifurcatingD1, SVG-rPDA  . History of kidney stones   . History of stroke 2012   "right eye stroke- half blind now"  . Hypertension associated with diabetes ([place])   . Mild aortic stenosis by prior echocardiogram 07/2019   Echo:  Mild aortic stenosis (gradients: Mean 14.3 mmHg -peak 24.9 mmHg).  . Morbid obesity ([place])   . MRSA (methicillin resistant staph aureus) culture positive   . OSA (obstructive sleep apnea)    no longer on CPAP or home  O2, states she doesn't need now after lap band  . PAD (peripheral artery disease) ([place]) 08/2013   LEA Dopplers to be read by Dr. Fletcher Anon  . PAF (paroxysmal atrial fibrillation) ([place]) 06/2014   Noted on CardioNet Monitor  - --> rhythm control with Tikosyn (Dr. Rayann Heman); converted from warfarin to apixaban for anticoagulation.  . Pneumonia   . Recurrent boils    Bilateral Groin  . Rheumatoid arthritis ([place])    Per Patient Report; associated with OA  . S/p cadaver renal transplant [place]  . Unstable angina ([place]) 5/01; 3/'02, 8/'03, 10/'04; 1/15   x 5 occurences since Inf-Post STEMI in 1994   Past Surgical History:  Procedure Laterality Date  . ABDOMINAL AORTOGRAM N/A 04/21/2018   Procedure: ABDOMINAL AORTOGRAM;  Surgeon: [name], MD;  Location: [place] [facility];  Service: Cardiovascular;  Laterality: N/A;  . CATHETER REMOVAL    . CHOLECYSTECTOMY N/A 10/29/2014   Procedure: LAPAROSCOPIC CHOLECYSTECTOMY WITH INTRAOPERATIVE CHOLANGIOGRAM;  Surgeon: Excell Seltzer, MD;  Location: [facility];  Service: General;  Laterality: N/A;  . CORONARY ANGIOPLASTY  1994   x5  . CORONARY ARTERY BYPASS GRAFT  1995   LIMA-LAD, SVG-RPDA, SVG-D1  . ESOPHAGOGASTRODUODENOSCOPY N/A 10/15/2016   Procedure: ESOPHAGOGASTRODUODENOSCOPY (EGD);  Surgeon: Wilford Corner, MD;  Location: [facility] ENDOSCOPY;  Service: Endoscopy;  Laterality: N/A;  . I & D EXTREMITY Right 01/29/2018   Procedure: IRRIGATION AND DEBRIDEMENT THUMB;  Surgeon: Dayna Barker, MD;  Location: [place];  Service: Plastics;  Laterality: Right;  . INCISE AND DRAIN ABCESS    . KIDNEY TRANSPLANT  1991  . KNEE ARTHROSCOPY WITH LATERAL MENISECTOMY Left 12/03/2017   Procedure: LEFT KNEE ARTHROSCOPY WITH LATERAL MENISECTOMY;  Surgeon: Earlie Server, MD;  Location: [place];  Service: Orthopedics;  Laterality: Left;  . LAPAROSCOPIC GASTRIC BANDING  04/2004; 10/'09, 2/'10   Port Replacement x 2  . LEFT HEART CATH AND CORS/GRAFTS ANGIOGRAPHY N/A 04/21/2018    Procedure: LEFT HEART CATH AND CORS/GRAFTS ANGIOGRAPHY;  Surgeon: [name], MD;  Location: [facility] [facility];  Ost-Prox LAD 50% - proxLAD (pre & post D1) 100% CTO. Cx - patent, small OM1 (stable ~ ostial OM1 90%, too small for PCI) & 2 small LPL; Ost-distal RCA 100% CTO.  LIMA-LAD (not injected); SVG-dRCA patent, SVG-D1 - insertion stent ~20% ISR - Severe R CFA disease w/ focal Sub TO  . LEFT HEART CATH AND CORS/GRAFTS ANGIOGRAPHY  5/'01, 3/'02, 8/'03, 10/'04; 1/'15   08/22/2013: LAD & RCA 100%; LIMA-LAD & SVG-rPDA patent; Cx-- OM1 60%, OM2 ostial ~50%; SVG-D1 - 80% mid, 50% distal ISR --PCI  . LEFT HEART CATHETERIZATION WITH CORONARY/GRAFT ANGIOGRAM N/A 08/23/2013   Procedure: LEFT HEART CATHETERIZATION WITH  Beatrix Fetters;  Surgeon: Wellington Hampshire, MD;  Location: [facility] C[facility] LAB;  Service: Cardiovascular;  Laterality: N/A;  . Lower Extremity Arterial Dopplers  08/2013   ABI: R 0.96, L 1.04  . MULTIPLE TOOTH EXTRACTIONS  age 89  . NM MYOVIEW LTD  03/2016   EF 62%. LOW RISK. C/W prior MI - no Ischemia. Apical hypokinesis.  [name] Kitchen PERCUTANEOUS CORONARY STENT INTERVENTION (PCI-S)  5/'01, 3/'02, 8/'03, 10/'04;   '01 - S660 BMS 2.5 x 9 - dSVG-D1 into D1; '02- post-stent stenosis - 2.5 x 8 Pixel BMS; '8\03: ISR/Thrombosis into native D1 - AngioJet, 2.5 x 13 Pixel; '04 - ISR 95% - covered stented area with Taxus DES 2.5 mm x 20 (2.88)  . PERCUTANEOUS CORONARY STENT INTERVENTION (PCI-S)  08/23/2013   Procedure: PERCUTANEOUS CORONARY STENT INTERVENTION (PCI-S);  Surgeon: Wellington Hampshire, MD;  Location: [facility] CATH LAB;  Service: Cardiovascular;;mid SVG-D1 80%; distal stent ~50% ISR; Promus Prermier DES 2.75 mm xc 20 mm (2.8 mm)  . PORT-A-CATH REMOVAL     kidney  . TRANSTHORACIC ECHOCARDIOGRAM  07/2019   EF 55 to 60%.  No LVH.  Paradoxical septal wall motion-post CABG.  GRII DD.  Normal RV size and function.  Mild bilateral atrial dilation.  Moderate MAC.  Trace MR.  Mild aortic stenosis (gradients: Mean  14.3 mmHg -peak 24.9 mmHg).  . TUBAL LIGATION    . wrist fistula repair Left    dialysis for one year    Current Outpatient Medications  Medication Sig Dispense Refill  . albuterol (PROVENTIL) (2.5 MG/3ML) 0.083% nebulizer solution Take 3 mLs (2.5 mg total) by nebulization every 6 (six) hours as needed for wheezing or shortness of breath (dx: J45.20). 75 mL 2  . albuterol (VENTOLIN HFA) 108 (90 Base) MCG/ACT inhaler TAKE 2 PUFFS BY MOUTH EVERY 6 HOURS AS NEEDED FOR WHEEZE OR SHORTNESS OF BREATH 18 g 5  . arformoterol (BROVANA) 15 MCG/2ML NEBU Take 2 mLs (15 mcg total) by nebulization 2 (two) times daily. 120 mL 12  . azaTHIOprine (IMURAN) 50 MG tablet Take 125 mg by mouth every evening. Taking 2 & 1/2 tabs (143m) qd    . B-D ULTRAFINE III SHORT PEN 31G X 8 MM MISC See admin instructions.    . budesonide (PULMICORT) 0.5 MG/2ML nebulizer solution Take 2 mLs (0.5 mg total) by nebulization 2 (two) times daily. 360 mL 1  . calcitRIOL (ROCALTROL) 0.25 MCG capsule Take 0.25 mcg by mouth every 3 (three) days.     . clotrimazole (MYCELEX) 10 MG troche Take 1 tablet (10 mg total) by mouth 5 (five) times daily. 35 tablet 0  . cyclobenzaprine (FLEXERIL) 10 MG tablet TAKE 1 TABLET BY MOUTH 3 TIMES DAILY AS NEEDED FOR MUSCLE SPASMS (Patient taking differently: Take 10 mg by mouth 2 (two) times daily. ) 90 tablet 2  . diltiazem (CARDIZEM CD) 120 MG 24 hr capsule TAKE 1 CAPSULE BY MOUTH EVERY DAY (Patient taking differently: Take 120 mg by mouth daily. ) 90 capsule 3  . dofetilide (TIKOSYN) 250 MCG capsule TAKE 1 CAPSULE BY MOUTH TWICE A DAY (Patient taking differently: Take 250 mcg by mouth 2 (two) times daily. TAKE 1 CAPSULE BY MOUTH TWICE A DAY) 180 capsule 2  . doxycycline (VIBRA-TABS) 100 MG tablet Take 1 tablet (100 mg total) by mouth 2 (two) times daily. 14 tablet 0  . DULoxetine (CYMBALTA) 30 MG capsule Take 30 mg by mouth daily.    .[name] KitchenELIQUIS 5 MG TABS tablet  TAKE 1 TABLET BY MOUTH TWICE A DAY (TO  REPLACE WARFARIN) 60 tablet 5  . ferrous sulfate 325 (65 FE) MG tablet Take 1 tablet (325 mg total) by mouth daily with breakfast. 60 tablet 0  . fluticasone (FLONASE) 50 MCG/ACT nasal spray PLACE 2 SPRAYS INTO BOTH NOSTRILS 2 TIMES DAILY 48 mL 1  . furosemide (LASIX) 40 MG tablet Take 40 mg by mouth 2 (two) times daily.    [name] Kitchen glimepiride (AMARYL) 4 MG tablet Take 4 mg by mouth daily.    . Insulin Glargine (BASAGLAR KWIKPEN) 100 UNIT/ML SOPN Inject 12 Units into the skin daily.    [name] Kitchen lamoTRIgine (LAMICTAL) 25 MG tablet TAKE 1 TABLET BY MOUTH EVERYDAY AT BEDTIME 90 tablet 0  . Lancets (ONETOUCH DELICA PLUS [id number]) MISC daily. as directed    . LORazepam (ATIVAN) 1 MG tablet Take 1 tablet (1 mg total) by mouth every 8 (eight) hours as needed for anxiety. Takes daily 30 tablet 1  . metFORMIN (GLUCOPHAGE) 500 MG tablet Take 500 mg by mouth 2 (two) times daily.  6  . metoprolol tartrate (LOPRESSOR) 50 MG tablet Take 1 tablet (50 mg total) by mouth 2 (two) times daily. 60 tablet 6  . montelukast (SINGULAIR) 10 MG tablet TAKE 1 TABLET BY MOUTH EVERY DAY (Patient taking differently: Take 10 mg by mouth daily. ) 90 tablet 1  . nitroGLYCERIN (NITROSTAT) 0.4 MG SL tablet Place 1 tablet (0.4 mg total) under the tongue every 5 (five) minutes as needed for chest pain. 25 tablet 6  . NOVOLOG FLEXPEN 100 UNIT/ML FlexPen Inject 4 Units into the skin 2 (two) times daily after a meal.   6  . omeprazole (PRILOSEC) 20 MG capsule Take 20 mg by mouth daily.    Glory Rosebush ULTRA test strip USE AS DIRECTED 3 TIMES DAILY TO CHECK BLOOD SUGAR    . Oxycodone HCl 10 MG TABS Take 1 tablet (10 mg total) by mouth every 8 (eight) hours as needed. 70 tablet 0  . predniSONE (DELTASONE) 5 MG tablet Take 5 mg by mouth daily.    . promethazine (PHENERGAN) 25 MG tablet Take 25 mg by mouth at bedtime. Or for sleep    . rosuvastatin (CRESTOR) 20 MG tablet Take 1 tablet (20 mg total) by mouth daily. 90 tablet 3  . sertraline (ZOLOFT) 50  MG tablet Take 1 tablet (50 mg total) by mouth at bedtime. 30 tablet 5   No current facility-administered medications for this encounter.    Allergies  Allergen Reactions  . Tetracycline Hives    Patient tolerated Doxycycline Dec 2020  . Hydromorphone Hcl Nausea And Vomiting  . Niacin Other (See Comments)    Mouth blisters  . Niaspan [Niacin Er] Other (See Comments)    Mouth blisters  . Sulfa Antibiotics Nausea Only and Other (See Comments)    "Tears up stomach"  . Sulfonamide Derivatives Other (See Comments)    Reaction: per patient "tears her stomach up"  . Codeine Nausea And Vomiting  . Erythromycin Nausea And Vomiting  . Hydromorphone Hcl Nausea And Vomiting  . Morphine And Related Nausea And Vomiting  . Nalbuphine Nausea And Vomiting  . Sulfasalazine Nausea Only and Other (See Comments)    per patient "tears her stomach up", "Tears up stomach"  . Tape Rash and Other (See Comments)    No "plastic" tape," please    Social History   Socioeconomic History  . Marital status: Widowed    Spouse name: Not  on file  . Number of children: Not on file  . Years of education: Not on file  . Highest education level: Not on file  Occupational History  . Not on file  Tobacco Use  . Smoking status: Former Smoker    Packs/day: 1.00    Years: 30.00    Pack years: 30.00    Types: Cigarettes    Quit date: 08/17/2002    Years since quitting: 17.6  . Smokeless tobacco: Never Used  Vaping Use  . Vaping Use: Never used  Substance and Sexual Activity  . Alcohol use: No  . Drug use: No  . Sexual activity: Not on file  Other Topics Concern  . Not on file  Social History Narrative   She is currently married, and the caregiver of her husband who is recovering from surgery for tongue cancer now diagnosed with lung cancer. Prior to his diagnosis of her husband, she actually had adopted a 68-year-old child who she knows caring for as well. With all the surgeries, they have been quite  financially troubled. Thanks the help of her community and church, they have been able to stay "alfoat."     She is a former smoker who quit in 2004 after a 30-pack-year history.   She is active chasing a 74-year-old child, does not do routine exercise. She's been quite depressed with the condition of her husband, and admits to eating comfort herself.   She does not drink alcohol.   Social Determinants of Health   Financial Resource Strain:   . Difficulty of Paying Living Expenses: Not on file  Food Insecurity: No Food Insecurity  . Worried About Charity fundraiser in the Last Year: Never true  . Ran Out of Food in the Last Year: Never true  Transportation Needs: No Transportation Needs  . Lack of Transportation (Medical): No  . Lack of Transportation (Non-Medical): No  Physical Activity:   . Days of Exercise per Week: Not on file  . Minutes of Exercise per Session: Not on file  Stress:   . Feeling of Stress : Not on file  Social Connections:   . Frequency of Communication with Friends and Family: Not on file  . Frequency of Social Gatherings with Friends and Family: Not on file  . Attends Religious Services: Not on file  . Active Member of Clubs or Organizations: Not on file  . Attends Archivist Meetings: Not on file  . Marital Status: Not on file  Intimate Partner Violence:   . Fear of Current or Ex-Partner: Not on file  . Emotionally Abused: Not on file  . Physically Abused: Not on file  . Sexually Abused: Not on file    Family History  Problem Relation Age of Onset  . Cancer Mother        liver  . Heart disease Father   . Cancer Father        colon  . Arrhythmia Brother        Atrial Fibrillation  . Arrhythmia Paternal Aunt        Atrial Fibrillation    ROS- All systems are reviewed and negative except as per the HPI above  Physical Exam: Vitals:   04/12/20 1203  Weight: 100.4 kg  Height: _0  (1.549 m)   Wt Readings from Last 3 Encounters:    04/12/20 100.4 kg  03/27/20 97.5 kg  02/22/20 95.3 kg    Labs: Lab Results  Component Value Date  NA 134 (L) 12/29/2019   K 4.1 12/29/2019   CL 92 (L) 12/29/2019   CO2 29 12/29/2019   GLUCOSE 141 (H) 12/29/2019   BUN 13 12/29/2019   CREATININE 0.76 12/29/2019   CALCIUM 9.5 12/29/2019   PHOS 3.8 12/29/2019   MG 1.9 12/29/2019   Lab Results  Component Value Date   INR 3.3 (A) 11/28/2019   Lab Results  Component Value Date   CHOL 111 11/30/2017   HDL 53.90 11/30/2017   LDLCALC 32 11/30/2017   TRIG 129.0 11/30/2017     GEN- The patient is well appearing, alert and oriented x 3 today.   Head- normocephalic, atraumatic Eyes-  Sclera clear, conjunctiva pink Ears- hearing intact Oropharynx- clear Neck- supple, no JVP Lymph- no cervical lymphadenopathy Lungs- Clear to ausculation bilaterally, normal work of breathing Heart- Regular rate and rhythm, no murmurs, rubs or gallops, PMI not laterally displaced GI- soft, NT, ND, + BS Extremities- no clubbing, cyanosis, or edema MS- no significant deformity or atrophy Skin- no rash or lesion Psych- euthymic mood, full affect Neuro- strength and sensation are intact  EKG- Sinus brady at 53 pbm, pr int 188 ms, qrs int 80 ms, qtc 489 ms  Epic records reviewed    Assessment and Plan: 1. Paroxysmal Afib  For most part in SR but has been feeling like her afib burden has increased  2. Presyncope    SHE WAS TAKING INTERMITTENT EXTRA DOSES OF TIKOYN AND WAS TOLD HOW DANGEROUS IT IS  TO DO THIS, MAY HAVE CONTRIBUTED TO PRESYNCOPAL EPISODE  AS SHE FEELS SHE TOOK AN EXTRA DOSE THAT AM OF EPISODE    Continue Tikosyn 250 mcg bid without EXTRA DOSES  General review of precautions with Tikosyn discussed  Will prescribe 30 mg cardizem if she feels that she needs something for palpitations  Continue  Cardizem 120 mg daily  Zio patch x 2 weeks  Bmet/cbc/mag today   3.  CHA2DS2VASc of at least 5 Continue  eliquis 5 mg bid   4.  CAD Stable   5. Pruritic scalp plaque I informed her to see dermatologist about this   Recheck September 30    Butch Penny C. Choua Chalker, [facility] [address] [place], [name] [address] [phone]

## 2020-04-12 NOTE — Patient Instructions (Signed)
Remove monitor on September 10th and mail in   Cardizem 30mg  -- take 1 tablet every 4 hours AS NEEDED for afib heart rate >100 as long as top blood pressure >100.

## 2020-04-17 ENCOUNTER — Other Ambulatory Visit: Payer: Self-pay | Admitting: *Deleted

## 2020-04-17 ENCOUNTER — Encounter: Payer: Self-pay | Admitting: *Deleted

## 2020-04-17 NOTE — Patient Outreach (Signed)
[facility]) Care Management [place], Case Closure  04/17/2020  [name] Feb 26, 1956 [id number]  Successful (consecutive) fourthtelephone outreachattemptto Katherine [name], 64 y/o female referred to [facility] RN CM5/20/21by [facility] CMA for EMMI Red-Alert notificationafterhospitalizationMay 10-14, 2055for shortness of breath/ malaise/ CHF exacerbation that occurred after she received her corona virus vaccine. She was discharged home to self-care with ongoing home health services for RN/ PT/ OT. Patient has history including, but not limited to, HTN/ HLD; PAD; paroxysmal Atrial Fibrillation; MI with CABG x 3; DM II; renal transplant; fibromyalgia; CHF/ OSA; morbid obesity with previous lap-banding surgery; and CKD.  HIPAA/ identity verified; patient immediately states that she does not feel up to talking to me today; discussed with patient multiple attempts to follow up with each time that she does not wish to talk; she declines ongoing need for [facility] CM services, and confirms that she has no ongoing care coordination/ pharmacy/ community resource/ disease management needs.  Patient adds that she has been keeping her care providers informed around her ongoing depression and that she has been referred to a psychologist but has not yet made an appointment- she was encouraged to do promptly and to keep her care providers updated around all needs, to which she reports she already does and will continue to do.  Reiterated with patient [facility] CM services and she pleasantly reports today that she has no ongoing care management needs; made patient aware that I would close case and encouraged her to contact me in the future should care coordination needs arise; she is agreeable to this and confirms that she has my previously mailed letter for reference.  Plan:  Will close [facility] CM case and make patient inactive with [facility] CM program, as she has declined ongoing follow up/  participation and denies care coordination/ disease management needs and will make patient's PCP aware of same  Oneta Rack, RN, BSN, [place] Coordinator [facility] Care Management  [phone]

## 2020-04-18 ENCOUNTER — Other Ambulatory Visit (HOSPITAL_COMMUNITY): Payer: Self-pay | Admitting: *Deleted

## 2020-04-18 MED ORDER — MAGNESIUM OXIDE 400 MG PO TABS: 400.0000 mg | ORAL_TABLET | Freq: Every day | ORAL | 3 refills | Status: DC

## 2020-04-25 ENCOUNTER — Telehealth: Payer: Self-pay | Admitting: *Deleted

## 2020-04-25 NOTE — Telephone Encounter (Signed)
Mrs Chevalier called and she is upset because she cannot get her oxycodone filled.  [facility].  Her last fill per PMP was 03/28/20 #70 and 30 days would be tomorrow 04/26/20.  She is wanting [place] to do something.  [name] do you want to call the pharmacy and tell them to fill early?

## 2020-04-25 NOTE — Telephone Encounter (Signed)
Patient called again

## 2020-04-25 NOTE — Telephone Encounter (Signed)
Pl[facility] call  [facility], she reports Dr Naaman Plummer prescription states do not fill till 04/26/2020. Placed a call to [name] regarding the above, she verbalizes understanding.

## 2020-05-01 DIAGNOSIS — I2510 Atherosclerotic heart disease of native coronary artery without angina pectoris: Secondary | ICD-10-CM | POA: Diagnosis not present

## 2020-05-01 DIAGNOSIS — Z940 Kidney transplant status: Secondary | ICD-10-CM | POA: Diagnosis not present

## 2020-05-01 DIAGNOSIS — I129 Hypertensive chronic kidney disease with stage 1 through stage 4 chronic kidney disease, or unspecified chronic kidney disease: Secondary | ICD-10-CM | POA: Diagnosis not present

## 2020-05-01 DIAGNOSIS — N2589 Other disorders resulting from impaired renal tubular function: Secondary | ICD-10-CM | POA: Diagnosis not present

## 2020-05-01 DIAGNOSIS — E785 Hyperlipidemia, unspecified: Secondary | ICD-10-CM | POA: Diagnosis not present

## 2020-05-01 DIAGNOSIS — N182 Chronic kidney disease, stage 2 (mild): Secondary | ICD-10-CM | POA: Diagnosis not present

## 2020-05-01 DIAGNOSIS — I509 Heart failure, unspecified: Secondary | ICD-10-CM | POA: Diagnosis not present

## 2020-05-01 DIAGNOSIS — D631 Anemia in chronic kidney disease: Secondary | ICD-10-CM | POA: Diagnosis not present

## 2020-05-01 DIAGNOSIS — I739 Peripheral vascular disease, unspecified: Secondary | ICD-10-CM | POA: Diagnosis not present

## 2020-05-01 DIAGNOSIS — E669 Obesity, unspecified: Secondary | ICD-10-CM | POA: Diagnosis not present

## 2020-05-01 DIAGNOSIS — N2581 Secondary hyperparathyroidism of renal origin: Secondary | ICD-10-CM | POA: Diagnosis not present

## 2020-05-01 DIAGNOSIS — E1122 Type 2 diabetes mellitus with diabetic chronic kidney disease: Secondary | ICD-10-CM | POA: Diagnosis not present

## 2020-05-08 ENCOUNTER — Other Ambulatory Visit (HOSPITAL_COMMUNITY): Payer: Self-pay | Admitting: Nurse Practitioner

## 2020-05-13 DIAGNOSIS — I480 Paroxysmal atrial fibrillation: Secondary | ICD-10-CM | POA: Diagnosis not present

## 2020-05-14 NOTE — Addendum Note (Signed)
Encounter addended by: Juluis Mire, RN on: 05/14/2020 10:00 AM  Actions taken: Imaging Exam ended

## 2020-05-16 ENCOUNTER — Ambulatory Visit (HOSPITAL_COMMUNITY): Payer: Medicare Other | Admitting: Nurse Practitioner

## 2020-05-18 ENCOUNTER — Other Ambulatory Visit: Payer: Self-pay | Admitting: Physical Medicine & Rehabilitation

## 2020-05-22 ENCOUNTER — Encounter: Payer: Self-pay | Admitting: Physical Medicine & Rehabilitation

## 2020-05-22 ENCOUNTER — Encounter: Payer: Medicare Other | Attending: Physical Medicine & Rehabilitation | Admitting: Physical Medicine & Rehabilitation

## 2020-05-22 ENCOUNTER — Other Ambulatory Visit: Payer: Self-pay

## 2020-05-22 DIAGNOSIS — M797 Fibromyalgia: Secondary | ICD-10-CM | POA: Diagnosis not present

## 2020-05-22 MED ORDER — OXYCODONE HCL 10 MG PO TABS: 10.0000 mg | ORAL_TABLET | Freq: Three times a day (TID) | ORAL | 0 refills | Status: DC | PRN

## 2020-05-22 NOTE — Progress Notes (Signed)
Subjective:    Patient ID: Katherine [name], female    DOB: 10-29-1955, 64 y.o.   MRN: [id number]  HPI   The patient has consented to a tele-health/telephone encounter with [place]. The patient is at home and the provider is at the office. Two separate patient identifiers were utilized to confirm the identity of the patient.    She continues on oxycodone 63m q8 prn for pain which continues to help her quality of life. She ran short earlier this month as she was taking 3 per day more often than 2.   Otherwise RDavishastates that she just does not "feel good."  She has felt increasingly dizzy.  She does not feel safe to be up and moving around.  I asked her if she is continuing to take Lasix 40 mg twice daily and she stated that she was.  She describes the dizziness as more of a lightheaded feeling although occasionally the room will feel like it spinning.  She was at a wedding a few weeks ago and her right leg gave out when she tried to get into a golf cart that was being used to transport Guest to [employer]    Pain Inventory Average Pain 10 Pain Right Now 7 My pain is constant, sharp, burning, dull, stabbing, tingling and aching  In the last 24 hours, has pain interfered with the following? General activity 7 Relation with others 5 Enjoyment of life 8 What TIME of day is your pain at its worst? morning  Sleep (in general) Good  Pain is worse with: walking and some activites Pain improves with: medication and heat Relief from Meds: 7  Family History  Problem Relation Age of Onset  . Cancer Mother        liver  . Heart disease Father   . Cancer Father        colon  . Arrhythmia Brother        Atrial Fibrillation  . Arrhythmia Paternal Aunt        Atrial Fibrillation   Social History   Socioeconomic History  . Marital status: Widowed    Spouse name: Not on file  . Number of children: Not on file  . Years of education: Not  on file  . Highest education level: Not on file  Occupational History  . Not on file  Tobacco Use  . Smoking status: Former Smoker    Packs/day: 1.00    Years: 30.00    Pack years: 30.00    Types: Cigarettes    Quit date: 08/17/2002    Years since quitting: 17.7  . Smokeless tobacco: Never Used  Vaping Use  . Vaping Use: Never used  Substance and Sexual Activity  . Alcohol use: No  . Drug use: No  . Sexual activity: Not on file  Other Topics Concern  . Not on file  Social History Narrative   She is currently married, and the caregiver of her husband who is recovering from surgery for tongue cancer now diagnosed with lung cancer. Prior to his diagnosis of her husband, she actually had adopted a 560year-old child who she knows caring for as well. With all the surgeries, they have been quite financially troubled. Thanks the help of her community and church, they have been able to stay "alfoat."     She is a former smoker who quit in 2004 after a 30-pack-year history.   She is active chasing a 580year-old child,  does not do routine exercise. She's been quite depressed with the condition of her husband, and admits to eating comfort herself.   She does not drink alcohol.   Social Determinants of Health   Financial Resource Strain:   . Difficulty of Paying Living Expenses: Not on file  Food Insecurity: No Food Insecurity  . Worried About Charity fundraiser in the Last Year: Never true  . Ran Out of Food in the Last Year: Never true  Transportation Needs: No Transportation Needs  . Lack of Transportation (Medical): No  . Lack of Transportation (Non-Medical): No  Physical Activity:   . Days of Exercise per Week: Not on file  . Minutes of Exercise per Session: Not on file  Stress:   . Feeling of Stress : Not on file  Social Connections:   . Frequency of Communication with Friends and Family: Not on file  . Frequency of Social Gatherings with Friends and Family: Not on file  . Attends  Religious Services: Not on file  . Active Member of Clubs or Organizations: Not on file  . Attends Archivist Meetings: Not on file  . Marital Status: Not on file   Past Surgical History:  Procedure Laterality Date  . ABDOMINAL AORTOGRAM N/A 04/21/2018   Procedure: ABDOMINAL AORTOGRAM;  Surgeon: [name], MD;  Location: [name];  Service: Cardiovascular;  Laterality: N/A;  . CATHETER REMOVAL    . CHOLECYSTECTOMY N/A 10/29/2014   Procedure: LAPAROSCOPIC CHOLECYSTECTOMY WITH INTRAOPERATIVE CHOLANGIOGRAM;  Surgeon: Excell Seltzer, MD;  Location: [facility];  Service: General;  Laterality: N/A;  . CORONARY ANGIOPLASTY  1994   x5  . CORONARY ARTERY BYPASS GRAFT  1995   LIMA-LAD, SVG-RPDA, SVG-D1  . ESOPHAGOGASTRODUODENOSCOPY N/A 10/15/2016   Procedure: ESOPHAGOGASTRODUODENOSCOPY (EGD);  Surgeon: Wilford Corner, MD;  Location: [facility] ENDOSCOPY;  Service: Endoscopy;  Laterality: N/A;  . I & D EXTREMITY Right 01/29/2018   Procedure: IRRIGATION AND DEBRIDEMENT THUMB;  Surgeon: Dayna Barker, MD;  Location: [facility];  Service: Plastics;  Laterality: Right;  . INCISE AND DRAIN ABCESS    . KIDNEY TRANSPLANT  1991  . KNEE ARTHROSCOPY WITH LATERAL MENISECTOMY Left 12/03/2017   Procedure: LEFT KNEE ARTHROSCOPY WITH LATERAL MENISECTOMY;  Surgeon: Earlie Server, MD;  Location: [facility];  Service: Orthopedics;  Laterality: Left;  . LAPAROSCOPIC GASTRIC BANDING  04/2004; 10/'09, 2/'10   Port Replacement x 2  . LEFT HEART CATH AND CORS/GRAFTS ANGIOGRAPHY N/A 04/21/2018   Procedure: LEFT HEART CATH AND CORS/GRAFTS ANGIOGRAPHY;  Surgeon: [name], MD;  Location: [facility] CV LAB;  Ost-Prox LAD 50% - proxLAD (pre & post D1) 100% CTO. Cx - patent, small OM1 (stable ~ ostial OM1 90%, too small for PCI) & 2 small LPL; Ost-distal RCA 100% CTO.  LIMA-LAD (not injected); SVG-dRCA patent, SVG-D1 - insertion stent ~20% ISR - Severe R CFA disease w/ focal Sub TO  . LEFT HEART CATH AND CORS/GRAFTS  ANGIOGRAPHY  5/'01, 3/'02, 8/'03, 10/'04; 1/'15   08/22/2013: LAD & RCA 100%; LIMA-LAD & SVG-rPDA patent; Cx-- OM1 60%, OM2 ostial ~50%; SVG-D1 - 80% mid, 50% distal ISR --PCI  . LEFT HEART CATHETERIZATION WITH CORONARY/GRAFT ANGIOGRAM N/A 08/23/2013   Procedure: LEFT HEART CATHETERIZATION WITH Beatrix Fetters;  Surgeon: Wellington Hampshire, MD;  Location: [facility] CATH LAB;  Service: Cardiovascular;  Laterality: N/A;  . Lower Extremity Arterial Dopplers  08/2013   ABI: R 0.96, L 1.04  . MULTIPLE TOOTH EXTRACTIONS  age 89  .  NM MYOVIEW LTD  03/2016   EF 62%. LOW RISK. C/W prior MI - no Ischemia. Apical hypokinesis.  [name] Kitchen PERCUTANEOUS CORONARY STENT INTERVENTION (PCI-S)  5/'01, 3/'02, 8/'03, 10/'04;   '01 - S660 BMS 2.5 x 9 - dSVG-D1 into D1; '02- post-stent stenosis - 2.5 x 8 Pixel BMS; '8\03: ISR/Thrombosis into native D1 - AngioJet, 2.5 x 13 Pixel; '04 - ISR 95% - covered stented area with Taxus DES 2.5 mm x 20 (2.88)  . PERCUTANEOUS CORONARY STENT INTERVENTION (PCI-S)  08/23/2013   Procedure: PERCUTANEOUS CORONARY STENT INTERVENTION (PCI-S);  Surgeon: Wellington Hampshire, MD;  Location: [facility] CATH LAB;  Service: Cardiovascular;;mid SVG-D1 80%; distal stent ~50% ISR; Promus Prermier DES 2.75 mm xc 20 mm (2.8 mm)  . PORT-A-CATH REMOVAL     kidney  . TRANSTHORACIC ECHOCARDIOGRAM  07/2019   EF 55 to 60%.  No LVH.  Paradoxical septal wall motion-post CABG.  GRII DD.  Normal RV size and function.  Mild bilateral atrial dilation.  Moderate MAC.  Trace MR.  Mild aortic stenosis (gradients: Mean 14.3 mmHg -peak 24.9 mmHg).  . TUBAL LIGATION    . wrist fistula repair Left    dialysis for one year   Past Surgical History:  Procedure Laterality Date  . ABDOMINAL AORTOGRAM N/A 04/21/2018   Procedure: ABDOMINAL AORTOGRAM;  Surgeon: [name], MD;  Location: [facility] CV LAB;  Service: Cardiovascular;  Laterality: N/A;  . CATHETER REMOVAL    . CHOLECYSTECTOMY N/A 10/29/2014   Procedure: LAPAROSCOPIC  CHOLECYSTECTOMY WITH INTRAOPERATIVE CHOLANGIOGRAM;  Surgeon: Excell Seltzer, MD;  Location: [facility];  Service: General;  Laterality: N/A;  . CORONARY ANGIOPLASTY  1994   x5  . CORONARY ARTERY BYPASS GRAFT  1995   LIMA-LAD, SVG-RPDA, SVG-D1  . ESOPHAGOGASTRODUODENOSCOPY N/A 10/15/2016   Procedure: ESOPHAGOGASTRODUODENOSCOPY (EGD);  Surgeon: Wilford Corner, MD;  Location: [facility] ENDOSCOPY;  Service: Endoscopy;  Laterality: N/A;  . I & D EXTREMITY Right 01/29/2018   Procedure: IRRIGATION AND DEBRIDEMENT THUMB;  Surgeon: Dayna Barker, MD;  Location: [facility];  Service: Plastics;  Laterality: Right;  . INCISE AND DRAIN ABCESS    . KIDNEY TRANSPLANT  1991  . KNEE ARTHROSCOPY WITH LATERAL MENISECTOMY Left 12/03/2017   Procedure: LEFT KNEE ARTHROSCOPY WITH LATERAL MENISECTOMY;  Surgeon: Earlie Server, MD;  Location: [facility];  Service: Orthopedics;  Laterality: Left;  . LAPAROSCOPIC GASTRIC BANDING  04/2004; 10/'09, 2/'10   Port Replacement x 2  . LEFT HEART CATH AND CORS/GRAFTS ANGIOGRAPHY N/A 04/21/2018   Procedure: LEFT HEART CATH AND CORS/GRAFTS ANGIOGRAPHY;  Surgeon: [name], MD;  Location: [facility] CV LAB;  Ost-Prox LAD 50% - proxLAD (pre & post D1) 100% CTO. Cx - patent, small OM1 (stable ~ ostial OM1 90%, too small for PCI) & 2 small LPL; Ost-distal RCA 100% CTO.  LIMA-LAD (not injected); SVG-dRCA patent, SVG-D1 - insertion stent ~20% ISR - Severe R CFA disease w/ focal Sub TO  . LEFT HEART CATH AND CORS/GRAFTS ANGIOGRAPHY  5/'01, 3/'02, 8/'03, 10/'04; 1/'15   08/22/2013: LAD & RCA 100%; LIMA-LAD & SVG-rPDA patent; Cx-- OM1 60%, OM2 ostial ~50%; SVG-D1 - 80% mid, 50% distal ISR --PCI  . LEFT HEART CATHETERIZATION WITH CORONARY/GRAFT ANGIOGRAM N/A 08/23/2013   Procedure: LEFT HEART CATHETERIZATION WITH Beatrix Fetters;  Surgeon: Wellington Hampshire, MD;  Location: [facility] CATH LAB;  Service: Cardiovascular;  Laterality: N/A;  . Lower Extremity Arterial Dopplers  08/2013   ABI: R 0.96, L 1.04  .  MULTIPLE TOOTH  EXTRACTIONS  age 76  . NM MYOVIEW LTD  03/2016   EF 62%. LOW RISK. C/W prior MI - no Ischemia. Apical hypokinesis.  [name] Kitchen PERCUTANEOUS CORONARY STENT INTERVENTION (PCI-S)  5/'01, 3/'02, 8/'03, 10/'04;   '01 - S660 BMS 2.5 x 9 - dSVG-D1 into D1; '02- post-stent stenosis - 2.5 x 8 Pixel BMS; '8\03: ISR/Thrombosis into native D1 - AngioJet, 2.5 x 13 Pixel; '04 - ISR 95% - covered stented area with Taxus DES 2.5 mm x 20 (2.88)  . PERCUTANEOUS CORONARY STENT INTERVENTION (PCI-S)  08/23/2013   Procedure: PERCUTANEOUS CORONARY STENT INTERVENTION (PCI-S);  Surgeon: Wellington Hampshire, MD;  Location: [facility] CATH LAB;  Service: Cardiovascular;;mid SVG-D1 80%; distal stent ~50% ISR; Promus Prermier DES 2.75 mm xc 20 mm (2.8 mm)  . PORT-A-CATH REMOVAL     kidney  . TRANSTHORACIC ECHOCARDIOGRAM  07/2019   EF 55 to 60%.  No LVH.  Paradoxical septal wall motion-post CABG.  GRII DD.  Normal RV size and function.  Mild bilateral atrial dilation.  Moderate MAC.  Trace MR.  Mild aortic stenosis (gradients: Mean 14.3 mmHg -peak 24.9 mmHg).  . TUBAL LIGATION    . wrist fistula repair Left    dialysis for one year   Past Medical History:  Diagnosis Date  . Anemia   . Anxiety   . Bilateral carotid artery stenosis    Carotid duplex 09/4399: 0-27% LICA, 25-36% RICA, >64% RECA, f/u 1 yr suggested  . CAD (coronary artery disease) of bypass graft 5/01; 3/'02, 8/'03, 10/'04; 1/15   PCI x 5 to SVG-D1   . CAD in native artery 07/1993   3 Vessel Disease (LAD-D1 & RCA) -- CABG (Dx in setting of inferior STEMI-PTCA of RCA)  . CAD S/P percutaneous coronary angioplasty    PCI to SVG-D1 insertion/native D1 x 4 = '01 -(S660 BMS 2.5 x 9 anastomosis- D1); '02 - distal overlap ACS Pixel 2.5 x 8  BMS; '03 distal/native ISR/Thrombosis - Pixel 2.5 x 13; '04 - ISR-  Taxus 2.5 x 20 (covered all);; 1/15 - mid SVG-D1 (50% distal ISR) - Promus P 2.75 x 20 -- 2.8 mm  . COPD mixed type ([place])    Followed by Dr. Lamonte Sakai "pulmonologist said  no COPD"  . Depression   . Depression with anxiety   . Diabetes mellitus type 2 in obese ([place])   . Diarrhea    started after cholecystectomy and mass removed from intestine  . Dyslipidemia, goal LDL below 70    08/2012: TC 137, TG 200, HDL 32!, LDL 45; on statin (followed by Dr.Deterding)  . ESRD (end stage renal disease) ([place]) 1991   s/p Cadaveric Renal Transplant [facility] - Dr. Jimmy Footman)   . Family history of adverse reaction to anesthesia    mom's bp dropped during/after anesthesia  . Fibromyalgia   . GERD (gastroesophageal reflux disease)   . Glomerulonephritis, chronic, rapidly progressive 38  . 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 07/1993   Rescue PTCA of RCA -- referred for CABG.  . H/O: GI bleed   . Headache    migraines in the past  . History of CABG x 3 08/1993   Dr. Servando Snare: LIMA-LAD, SVG-bifurcatingD1, SVG-rPDA  . History of kidney stones   . History of stroke 2012   "right eye stroke- half blind now"  . Hypertension associated with diabetes ([place])   . Mild aortic stenosis by prior echocardiogram 07/2019   Echo:  Mild aortic stenosis (gradients: Mean  14.3 mmHg -peak 24.9 mmHg).  . Morbid obesity ([place])   . MRSA (methicillin resistant staph aureus) culture positive   . OSA (obstructive sleep apnea)    no longer on CPAP or home O2, states she doesn't need now after lap band  . PAD (peripheral artery disease) ([place]) 08/2013   LEA Dopplers to be read by Dr. Fletcher Anon  . PAF (paroxysmal atrial fibrillation) ([place]) 06/2014   Noted on CardioNet Monitor  - --> rhythm control with Tikosyn (Dr. Rayann Heman); converted from warfarin to apixaban for anticoagulation.  . Pneumonia   . Recurrent boils    Bilateral Groin  . Rheumatoid arthritis ([place])    Per Patient Report; associated with OA  . S/p cadaver renal transplant [place]  . Unstable angina ([place]) 5/01; 3/'02, 8/'03, 10/'04; 1/15   x 5 occurences since Inf-Post STEMI in 1994   There were no  vitals taken for this visit.  Opioid Risk Score:   Fall Risk Score:  `1  Depression screen PHQ 2/9  Depression screen [facility] 2/9 01/04/2020 11/02/2018 07/04/2018  Decreased Interest 0 3 3  Down, Depressed, Hopeless _0 PHQ - 2 Score _1 Altered sleeping - 1 3  Tired, decreased energy - 1 3  Change in appetite - 1 2  Feeling bad or failure about yourself  - 0 3  Trouble concentrating - 0 2  Moving slowly or fidgety/restless - 0 0  Suicidal thoughts - 0 0  PHQ-9 Score - 9 19  Difficult doing work/chores - - Very difficult  Some recent data might be hidden   Review of Systems  Constitutional: Negative.   HENT: Negative.   Eyes: Negative.   Respiratory: Negative.   Cardiovascular: Negative.   Gastrointestinal: Negative.   Endocrine: Negative.   Genitourinary: Negative.   Musculoskeletal: Positive for arthralgias, back pain and gait problem.  Skin: Negative.   Allergic/Immunologic: Negative.   Hematological: Negative.   Psychiatric/Behavioral: Negative.        Objective:   Physical Exam n/a       Assessment & Plan:  1. Fibromyalgia:               - Continue Tegretol and Cymbalta (51m)              -maintain HEP as tolerated 2. Rheumatoid Arthritis: Refilled: Oxycodone 194mone tablet every 8 hours as needed for severe pain and increased to  #75. RF today and second rx for next month. Discussed "long term" approach to her pain mgt.             We will continue the controlled substance monitoring program, this consists of regular clinic visits, examinations, routine drug screening, pill counts [place] well [place].  .   3. Osteoarthritis--polyarticular:               4. ESRD with hx of renal transplant on chronic steroids:              -Nephrology following 5. Right rotator cuff tendonitis/like degenerative/inflammatory changes in the right shoulder also.              -shoulder ROM 6.  GERD:             -resume pepcid                7. Depression:              -  continue current meds  -neuropsych eval might be helpful. 8. Lumbar Radiculitis: previous  Left L4-5 Transformanial ESI with relief   9. Bilateral  Hip Pain:              -HEP as previously discussed 10. Type 2 DM with Peripheral Neuropathy             -lamictal 11. Dizziness: might be volume related  -advised that she check with primary or nephrology re: lasix dose, volume status. BMET might be in order.  I offered to order the test here in this building but she preferred to discuss with her primary care physician which is fine.     15 minutes of tele-visit time was spent with this patient today. Follow up in 2 months with nurse practitioner

## 2020-05-27 ENCOUNTER — Ambulatory Visit (HOSPITAL_COMMUNITY): Payer: Medicare Other | Admitting: Nurse Practitioner

## 2020-05-29 ENCOUNTER — Other Ambulatory Visit: Payer: Self-pay | Admitting: Emergency Medicine

## 2020-05-31 ENCOUNTER — Telehealth: Payer: Self-pay

## 2020-05-31 NOTE — Telephone Encounter (Signed)
Katherine [name] c/o a pinched nerve in her back. The pain is not controlled with her current medications. She would like to discuss the issue with you.   Patient is aware you are not in the office. She has been advised to make an appointment to see you or [name] NP.   Call back ph# [phone].

## 2020-06-02 ENCOUNTER — Other Ambulatory Visit: Payer: Self-pay | Admitting: Physical Medicine & Rehabilitation

## 2020-06-02 ENCOUNTER — Other Ambulatory Visit (HOSPITAL_COMMUNITY): Payer: Self-pay | Admitting: Nurse Practitioner

## 2020-06-03 NOTE — Telephone Encounter (Signed)
Called and left vm  °

## 2020-06-12 ENCOUNTER — Telehealth: Payer: Self-pay

## 2020-06-12 ENCOUNTER — Encounter (HOSPITAL_COMMUNITY): Payer: Self-pay | Admitting: *Deleted

## 2020-06-12 NOTE — Telephone Encounter (Signed)
Patient called wanting to speak to Dr. Naaman Plummer directly about what is going on with her leg and back.

## 2020-06-12 NOTE — Telephone Encounter (Signed)
Talked with Mrs Friedhoff about her problem. Will try some heat/ice/gentle stretches. She may take her muscle relaxant. Also would like for her to be on cancellation list for appointment. thx

## 2020-06-19 ENCOUNTER — Telehealth: Payer: Self-pay

## 2020-06-19 DIAGNOSIS — R2689 Other abnormalities of gait and mobility: Secondary | ICD-10-CM

## 2020-06-19 DIAGNOSIS — M4726 Other spondylosis with radiculopathy, lumbar region: Secondary | ICD-10-CM

## 2020-06-19 DIAGNOSIS — M1711 Unilateral primary osteoarthritis, right knee: Secondary | ICD-10-CM

## 2020-06-19 NOTE — Telephone Encounter (Signed)
Patient called requesting an order for power wheelchair.

## 2020-06-19 NOTE — Telephone Encounter (Signed)
It's not as simple as that. It requires an official office visit. A referral to physical therapy, and contact with a vendor which builds the chair. If she wishes I can make a referral to neuro-rehab for a wheelchair assessment. There is a waiting list to get in there though.

## 2020-06-20 IMAGING — CR DG PELVIS 1-2V
1 series · 1 of 1 positions shown · non-contrast
Comparison: No prior.

CLINICAL DATA: Pelvic and hip pain.

EXAM:
PELVIS - 1-2 VIEW

[w pelvis *]
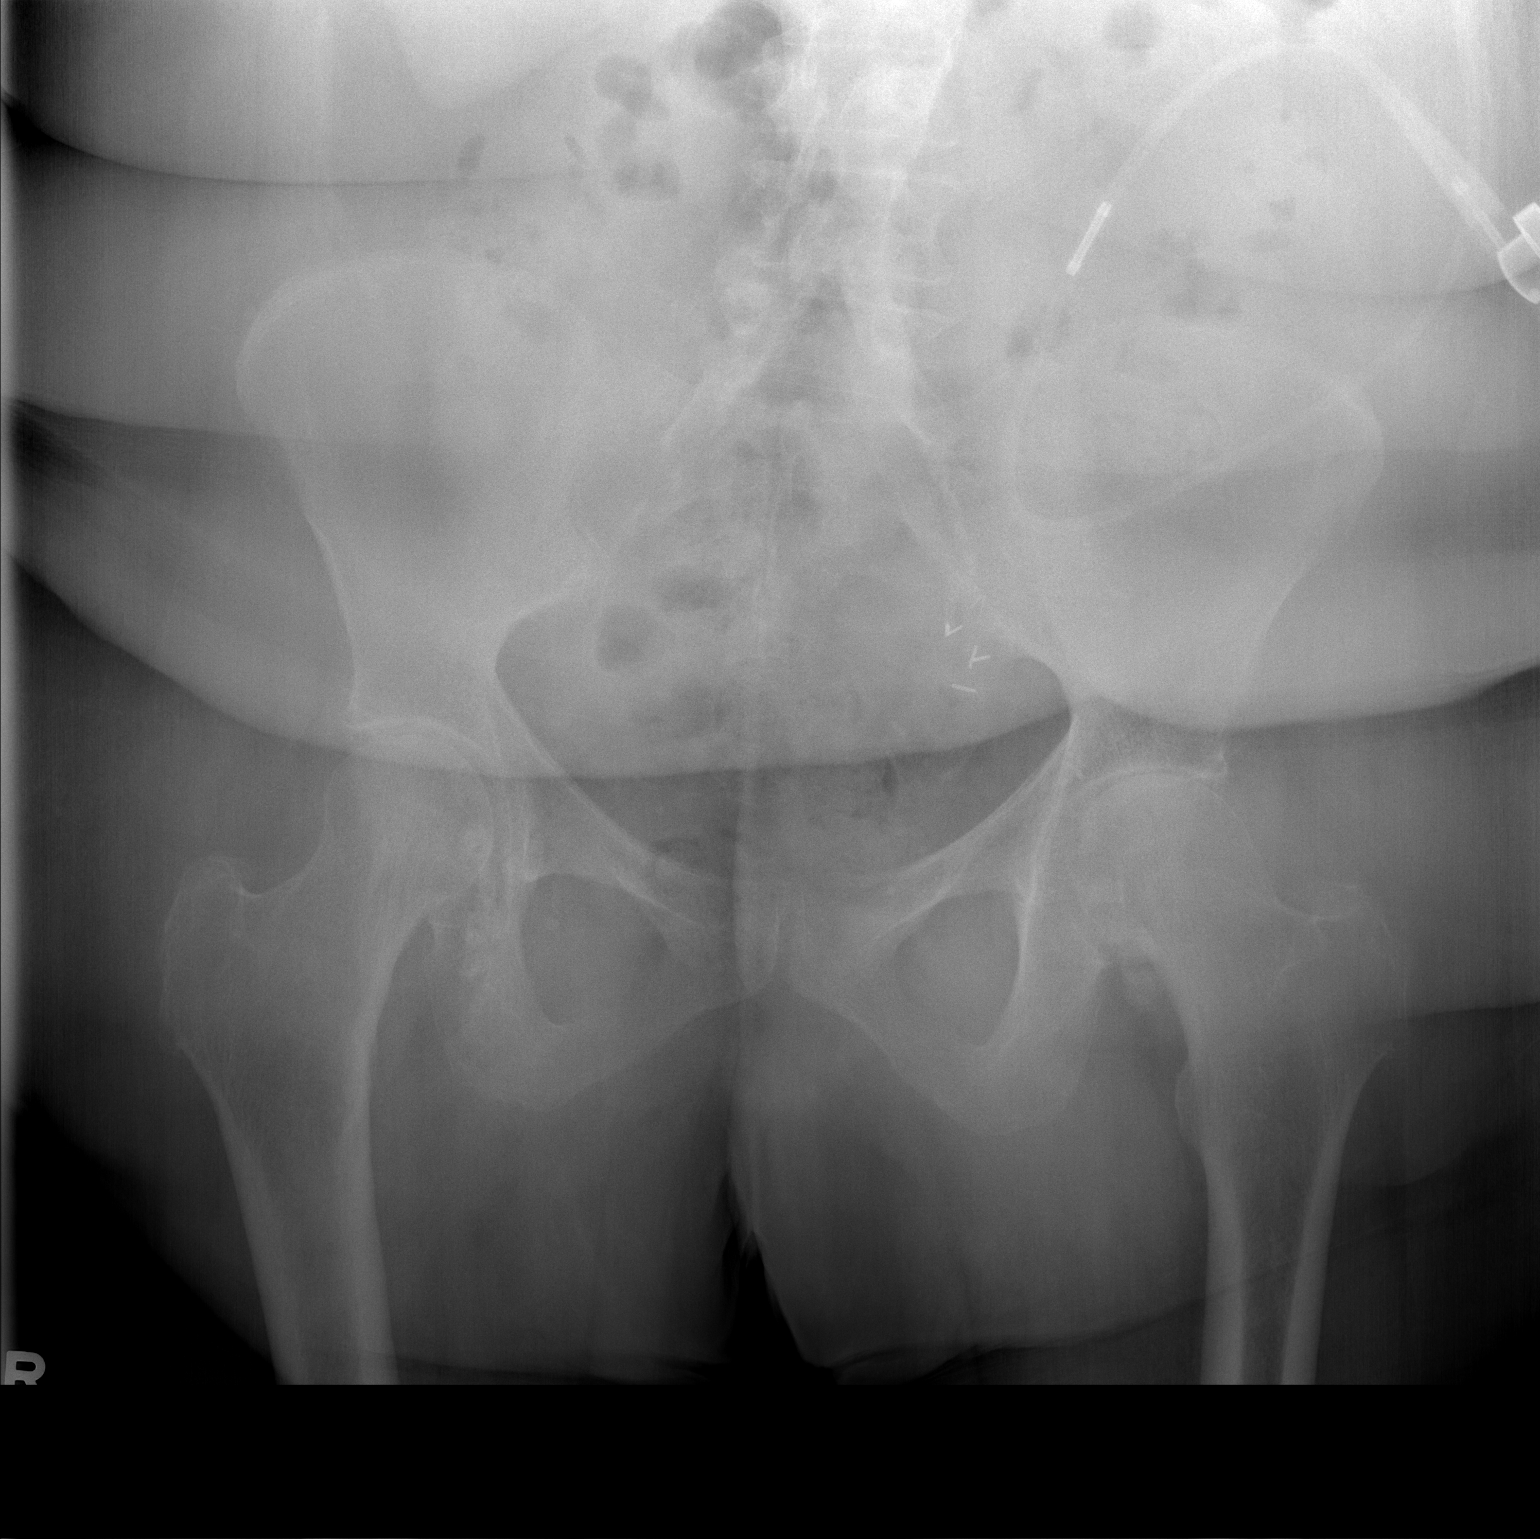

[1 of 1 positions shown; findings below may reference images not displayed]

FINDINGS: Surgical clips noted over the abdomen. Catheter noted over the left
abdomen. Degenerative change lumbar spine and both hips. No acute
bony or joint abnormality identified. Peripheral vascular
calcification.
IMPRESSION: 1. Degenerative changes lumbar spine and both hips. No acute bony
abnormality.

2.  Peripheral vascular disease.

## 2020-06-20 NOTE — Telephone Encounter (Signed)
I spoke with [facility] and they say she will need an eval by a therapist and a letter of medical necessity from the therapist, and Dr Naaman Plummer would sign off on that.  [facility] typically uses [facility] or [facility] and it is usually 2 weeks to a month to get an appt.

## 2020-06-20 NOTE — Telephone Encounter (Signed)
Thank you Sybil for calling them. We can use [facility]. I will make a referral. I called and talked to Mrs Spohr. We can adjust her f/u appt with me if needed to make sure it's after the scooter eval.

## 2020-06-20 NOTE — Telephone Encounter (Signed)
She wants to proceed. Should her appt 12/8 be termed a "wheelchair eval" for your documentation?

## 2020-06-21 ENCOUNTER — Telehealth: Payer: Self-pay

## 2020-06-21 DIAGNOSIS — M797 Fibromyalgia: Secondary | ICD-10-CM

## 2020-06-21 MED ORDER — OXYCODONE HCL 10 MG PO TABS: 10.0000 mg | ORAL_TABLET | Freq: Three times a day (TID) | ORAL | 0 refills | Status: DC | PRN

## 2020-06-21 NOTE — Telephone Encounter (Signed)
Patient called stating that she went to refill her Oxycodone and pharmacy states it says on prescription do not fill until 06/25/2020. Last filled 05/22/2020

## 2020-06-21 NOTE — Telephone Encounter (Signed)
Rx replaced

## 2020-06-24 NOTE — Telephone Encounter (Signed)
Notified. 

## 2020-07-11 ENCOUNTER — Other Ambulatory Visit (HOSPITAL_COMMUNITY): Payer: Medicare Other

## 2020-07-11 ENCOUNTER — Inpatient Hospital Stay (HOSPITAL_COMMUNITY)
Admission: EM | Admit: 2020-07-11 | Discharge: 2020-07-14 | DRG: 291 | Disposition: A | Discharge status: Home / Self Care | Payer: Medicare Other | Attending: Internal Medicine | Admitting: Internal Medicine

## 2020-07-11 ENCOUNTER — Encounter (HOSPITAL_COMMUNITY): Payer: Self-pay | Admitting: Emergency Medicine

## 2020-07-11 ENCOUNTER — Emergency Department (HOSPITAL_COMMUNITY): Payer: Medicare Other

## 2020-07-11 DIAGNOSIS — R0902 Hypoxemia: Secondary | ICD-10-CM | POA: Diagnosis not present

## 2020-07-11 DIAGNOSIS — J80 Acute respiratory distress syndrome: Secondary | ICD-10-CM | POA: Diagnosis not present

## 2020-07-11 DIAGNOSIS — I5033 Acute on chronic diastolic (congestive) heart failure: Secondary | ICD-10-CM | POA: Diagnosis present

## 2020-07-11 DIAGNOSIS — M47814 Spondylosis without myelopathy or radiculopathy, thoracic region: Secondary | ICD-10-CM | POA: Diagnosis not present

## 2020-07-11 DIAGNOSIS — G43909 Migraine, unspecified, not intractable, without status migrainosus: Secondary | ICD-10-CM | POA: Diagnosis present

## 2020-07-11 DIAGNOSIS — R6510 Systemic inflammatory response syndrome (SIRS) of non-infectious origin without acute organ dysfunction: Secondary | ICD-10-CM | POA: Diagnosis present

## 2020-07-11 DIAGNOSIS — I110 Hypertensive heart disease with heart failure: Secondary | ICD-10-CM | POA: Diagnosis present

## 2020-07-11 DIAGNOSIS — F419 Anxiety disorder, unspecified: Secondary | ICD-10-CM | POA: Diagnosis present

## 2020-07-11 DIAGNOSIS — Z7901 Long term (current) use of anticoagulants: Secondary | ICD-10-CM | POA: Diagnosis not present

## 2020-07-11 DIAGNOSIS — Z6841 Body Mass Index (BMI) 40.0 and over, adult: Secondary | ICD-10-CM | POA: Diagnosis not present

## 2020-07-11 DIAGNOSIS — R0602 Shortness of breath: Secondary | ICD-10-CM | POA: Diagnosis not present

## 2020-07-11 DIAGNOSIS — Z7952 Long term (current) use of systemic steroids: Secondary | ICD-10-CM | POA: Diagnosis not present

## 2020-07-11 DIAGNOSIS — E1142 Type 2 diabetes mellitus with diabetic polyneuropathy: Secondary | ICD-10-CM | POA: Diagnosis present

## 2020-07-11 DIAGNOSIS — Z23 Encounter for immunization: Secondary | ICD-10-CM | POA: Diagnosis not present

## 2020-07-11 DIAGNOSIS — M069 Rheumatoid arthritis, unspecified: Secondary | ICD-10-CM | POA: Diagnosis present

## 2020-07-11 DIAGNOSIS — I447 Left bundle-branch block, unspecified: Secondary | ICD-10-CM | POA: Diagnosis not present

## 2020-07-11 DIAGNOSIS — D84821 Immunodeficiency due to drugs: Secondary | ICD-10-CM | POA: Diagnosis present

## 2020-07-11 DIAGNOSIS — Z881 Allergy status to other antibiotic agents status: Secondary | ICD-10-CM | POA: Diagnosis not present

## 2020-07-11 DIAGNOSIS — R509 Fever, unspecified: Secondary | ICD-10-CM | POA: Diagnosis present

## 2020-07-11 DIAGNOSIS — I252 Old myocardial infarction: Secondary | ICD-10-CM

## 2020-07-11 DIAGNOSIS — D649 Anemia, unspecified: Secondary | ICD-10-CM | POA: Diagnosis present

## 2020-07-11 DIAGNOSIS — E785 Hyperlipidemia, unspecified: Secondary | ICD-10-CM | POA: Diagnosis present

## 2020-07-11 DIAGNOSIS — Z800 Family history of malignant neoplasm of digestive organs: Secondary | ICD-10-CM

## 2020-07-11 DIAGNOSIS — I517 Cardiomegaly: Secondary | ICD-10-CM | POA: Diagnosis not present

## 2020-07-11 DIAGNOSIS — I340 Nonrheumatic mitral (valve) insufficiency: Secondary | ICD-10-CM | POA: Diagnosis not present

## 2020-07-11 DIAGNOSIS — R9431 Abnormal electrocardiogram [ECG] [EKG]: Secondary | ICD-10-CM | POA: Diagnosis not present

## 2020-07-11 DIAGNOSIS — Z87891 Personal history of nicotine dependence: Secondary | ICD-10-CM

## 2020-07-11 DIAGNOSIS — J810 Acute pulmonary edema: Secondary | ICD-10-CM

## 2020-07-11 DIAGNOSIS — T501X6A Underdosing of loop [high-ceiling] diuretics, initial encounter: Secondary | ICD-10-CM | POA: Diagnosis present

## 2020-07-11 DIAGNOSIS — K219 Gastro-esophageal reflux disease without esophagitis: Secondary | ICD-10-CM | POA: Diagnosis present

## 2020-07-11 DIAGNOSIS — Z20822 Contact with and (suspected) exposure to covid-19: Secondary | ICD-10-CM | POA: Diagnosis present

## 2020-07-11 DIAGNOSIS — E8770 Fluid overload, unspecified: Secondary | ICD-10-CM | POA: Diagnosis present

## 2020-07-11 DIAGNOSIS — Z9884 Bariatric surgery status: Secondary | ICD-10-CM | POA: Diagnosis not present

## 2020-07-11 DIAGNOSIS — Z885 Allergy status to narcotic agent status: Secondary | ICD-10-CM

## 2020-07-11 DIAGNOSIS — E1165 Type 2 diabetes mellitus with hyperglycemia: Secondary | ICD-10-CM | POA: Diagnosis not present

## 2020-07-11 DIAGNOSIS — R918 Other nonspecific abnormal finding of lung field: Secondary | ICD-10-CM | POA: Diagnosis not present

## 2020-07-11 DIAGNOSIS — I10 Essential (primary) hypertension: Secondary | ICD-10-CM | POA: Diagnosis not present

## 2020-07-11 DIAGNOSIS — Z940 Kidney transplant status: Secondary | ICD-10-CM | POA: Diagnosis not present

## 2020-07-11 DIAGNOSIS — Z79899 Other long term (current) drug therapy: Secondary | ICD-10-CM

## 2020-07-11 DIAGNOSIS — Z882 Allergy status to sulfonamides status: Secondary | ICD-10-CM

## 2020-07-11 DIAGNOSIS — F32A Depression, unspecified: Secondary | ICD-10-CM | POA: Diagnosis present

## 2020-07-11 DIAGNOSIS — E1169 Type 2 diabetes mellitus with other specified complication: Secondary | ICD-10-CM | POA: Diagnosis present

## 2020-07-11 DIAGNOSIS — M7989 Other specified soft tissue disorders: Secondary | ICD-10-CM | POA: Diagnosis present

## 2020-07-11 DIAGNOSIS — Z951 Presence of aortocoronary bypass graft: Secondary | ICD-10-CM

## 2020-07-11 DIAGNOSIS — I480 Paroxysmal atrial fibrillation: Secondary | ICD-10-CM | POA: Diagnosis present

## 2020-07-11 DIAGNOSIS — G4733 Obstructive sleep apnea (adult) (pediatric): Secondary | ICD-10-CM | POA: Diagnosis present

## 2020-07-11 DIAGNOSIS — Z8673 Personal history of transient ischemic attack (TIA), and cerebral infarction without residual deficits: Secondary | ICD-10-CM

## 2020-07-11 DIAGNOSIS — J9621 Acute and chronic respiratory failure with hypoxia: Secondary | ICD-10-CM | POA: Diagnosis present

## 2020-07-11 DIAGNOSIS — J45901 Unspecified asthma with (acute) exacerbation: Secondary | ICD-10-CM | POA: Diagnosis present

## 2020-07-11 DIAGNOSIS — Z888 Allergy status to other drugs, medicaments and biological substances status: Secondary | ICD-10-CM

## 2020-07-11 DIAGNOSIS — J4521 Mild intermittent asthma with (acute) exacerbation: Secondary | ICD-10-CM | POA: Diagnosis not present

## 2020-07-11 DIAGNOSIS — Z794 Long term (current) use of insulin: Secondary | ICD-10-CM

## 2020-07-11 DIAGNOSIS — Z8614 Personal history of Methicillin resistant Staphylococcus aureus infection: Secondary | ICD-10-CM

## 2020-07-11 DIAGNOSIS — Z91048 Other nonmedicinal substance allergy status: Secondary | ICD-10-CM

## 2020-07-11 DIAGNOSIS — Z8249 Family history of ischemic heart disease and other diseases of the circulatory system: Secondary | ICD-10-CM

## 2020-07-11 DIAGNOSIS — Z7951 Long term (current) use of inhaled steroids: Secondary | ICD-10-CM

## 2020-07-11 LAB — CBC
HCT: 34.9 % — ABNORMAL LOW (ref 36.0–46.0)
Hemoglobin: 10.5 g/dL — ABNORMAL LOW (ref 12.0–15.0)
MCH: 29.9 pg (ref 26.0–34.0)
MCHC: 30.1 g/dL (ref 30.0–36.0)
MCV: 99.4 fL (ref 80.0–100.0)
Platelets: 239 10*3/uL (ref 150–400)
RBC: 3.51 MIL/uL — ABNORMAL LOW (ref 3.87–5.11)
RDW: 15.6 % — ABNORMAL HIGH (ref 11.5–15.5)
WBC: 8.7 10*3/uL (ref 4.0–10.5)
nRBC: 0 % (ref 0.0–0.2)

## 2020-07-11 LAB — GLUCOSE, CAPILLARY
Glucose-Capillary: 290 mg/dL — ABNORMAL HIGH (ref 70–99)
Glucose-Capillary: 290 mg/dL — ABNORMAL HIGH (ref 70–99)

## 2020-07-11 LAB — BASIC METABOLIC PANEL
Anion gap: 12 (ref 5–15)
BUN: 13 mg/dL (ref 8–23)
CO2: 25 mmol/L (ref 22–32)
Calcium: 9.3 mg/dL (ref 8.9–10.3)
Chloride: 102 mmol/L (ref 98–111)
Creatinine, Ser: 0.99 mg/dL (ref 0.44–1.00)
GFR, Estimated: >60 mL/min (ref >60)
Glucose, Bld: 221 mg/dL — ABNORMAL HIGH (ref 70–99)
Potassium: 4 mmol/L (ref 3.5–5.1)
Sodium: 139 mmol/L (ref 135–145)

## 2020-07-11 LAB — TROPONIN I (HIGH SENSITIVITY): Troponin I (High Sensitivity): 35 ng/L — ABNORMAL HIGH (ref <18)

## 2020-07-11 LAB — LACTIC ACID, PLASMA
Lactic Acid, Venous: 3.1 mmol/L (ref 0.5–1.9)
Lactic Acid, Venous: 2.1 mmol/L (ref 0.5–1.9)

## 2020-07-11 LAB — APTT: aPTT: 35 seconds (ref 24–36)

## 2020-07-11 LAB — RESP PANEL BY RT-PCR (FLU A&B, COVID) ARPGX2
Influenza A by PCR: NEGATIVE
Influenza B by PCR: NEGATIVE
SARS Coronavirus 2 by RT PCR: NEGATIVE

## 2020-07-11 LAB — PROTIME-INR
INR: 1.1 (ref 0.8–1.2)
Prothrombin Time: 13.7 seconds (ref 11.4–15.2)

## 2020-07-11 LAB — BRAIN NATRIURETIC PEPTIDE: B Natriuretic Peptide: 234.9 pg/mL — ABNORMAL HIGH (ref 0.0–100.0)

## 2020-07-11 LAB — MAGNESIUM: Magnesium: 1.6 mg/dL — ABNORMAL LOW (ref 1.7–2.4)

## 2020-07-11 LAB — HEMOGLOBIN A1C
Hgb A1c MFr Bld: 7.7 % — ABNORMAL HIGH (ref 4.8–5.6)
Mean Plasma Glucose: 174.29 mg/dL

## 2020-07-11 LAB — PROCALCITONIN: Procalcitonin: <0.1 ng/mL

## 2020-07-11 MED ORDER — IPRATROPIUM-ALBUTEROL 0.5-2.5 (3) MG/3ML IN SOLN
3.0000 mL | Freq: Four times a day (QID) | RESPIRATORY_TRACT | Status: DC
  Administered 2020-07-11 – 2020-07-12 (×3): 3 mL via RESPIRATORY_TRACT
  Filled 2020-07-11 (×3): qty 3

## 2020-07-11 MED ORDER — OXYCODONE HCL 5 MG PO TABS
10.0000 mg | ORAL_TABLET | Freq: Three times a day (TID) | ORAL | Status: DC
  Administered 2020-07-11 – 2020-07-14 (×10): 10 mg via ORAL
  Filled 2020-07-11 (×10): qty 2

## 2020-07-11 MED ORDER — INSULIN GLARGINE 100 UNIT/ML ~~LOC~~ SOLN
12.0000 [IU] | Freq: Every day | SUBCUTANEOUS | Status: DC
  Administered 2020-07-11 – 2020-07-14 (×4): 12 [IU] via SUBCUTANEOUS
  Filled 2020-07-11 (×5): qty 0.12

## 2020-07-11 MED ORDER — SODIUM CHLORIDE 0.9% FLUSH
3.0000 mL | Freq: Two times a day (BID) | INTRAVENOUS | Status: DC
  Administered 2020-07-12 – 2020-07-14 (×5): 3 mL via INTRAVENOUS

## 2020-07-11 MED ORDER — INSULIN ASPART 100 UNIT/ML ~~LOC~~ SOLN: 0.0000 [IU] | Freq: Three times a day (TID) | SUBCUTANEOUS | Status: DC

## 2020-07-11 MED ORDER — AZITHROMYCIN 250 MG PO TABS
500.0000 mg | ORAL_TABLET | Freq: Once | ORAL | Status: AC
  Administered 2020-07-11: 500 mg via ORAL
  Filled 2020-07-11: qty 2

## 2020-07-11 MED ORDER — METOPROLOL TARTRATE 25 MG PO TABS
50.0000 mg | ORAL_TABLET | Freq: Two times a day (BID) | ORAL | Status: DC
  Administered 2020-07-11 – 2020-07-14 (×7): 50 mg via ORAL
  Filled 2020-07-11 (×7): qty 2

## 2020-07-11 MED ORDER — APIXABAN 5 MG PO TABS
5.0000 mg | ORAL_TABLET | Freq: Two times a day (BID) | ORAL | Status: DC
  Administered 2020-07-11 – 2020-07-14 (×7): 5 mg via ORAL
  Filled 2020-07-11 (×7): qty 1

## 2020-07-11 MED ORDER — PREDNISONE 20 MG PO TABS
40.0000 mg | ORAL_TABLET | Freq: Every day | ORAL | Status: DC
  Filled 2020-07-11: qty 2

## 2020-07-11 MED ORDER — LAMOTRIGINE 25 MG PO TABS
25.0000 mg | ORAL_TABLET | Freq: Every day | ORAL | Status: DC
  Administered 2020-07-11 – 2020-07-13 (×3): 25 mg via ORAL
  Filled 2020-07-11 (×4): qty 1

## 2020-07-11 MED ORDER — INSULIN ASPART 100 UNIT/ML ~~LOC~~ SOLN
6.0000 [IU] | Freq: Once | SUBCUTANEOUS | Status: AC
  Administered 2020-07-11: 6 [IU] via SUBCUTANEOUS

## 2020-07-11 MED ORDER — SODIUM CHLORIDE 0.9 % IV SOLN
2.0000 g | Freq: Once | INTRAVENOUS | Status: AC
  Administered 2020-07-11: 2 g via INTRAVENOUS
  Filled 2020-07-11: qty 2

## 2020-07-11 MED ORDER — INSULIN ASPART 100 UNIT/ML ~~LOC~~ SOLN
0.0000 [IU] | Freq: Three times a day (TID) | SUBCUTANEOUS | Status: DC
  Administered 2020-07-11: 11 [IU] via SUBCUTANEOUS
  Administered 2020-07-12: 15 [IU] via SUBCUTANEOUS
  Administered 2020-07-12: 11 [IU] via SUBCUTANEOUS
  Administered 2020-07-12: 7 [IU] via SUBCUTANEOUS
  Administered 2020-07-13 (×2): 11 [IU] via SUBCUTANEOUS
  Administered 2020-07-13: 4 [IU] via SUBCUTANEOUS

## 2020-07-11 MED ORDER — ROSUVASTATIN CALCIUM 20 MG PO TABS
20.0000 mg | ORAL_TABLET | Freq: Every day | ORAL | Status: DC
  Administered 2020-07-11 – 2020-07-14 (×4): 20 mg via ORAL
  Filled 2020-07-11 (×4): qty 1

## 2020-07-11 MED ORDER — SERTRALINE HCL 100 MG PO TABS
100.0000 mg | ORAL_TABLET | Freq: Every day | ORAL | Status: DC
  Administered 2020-07-11 – 2020-07-14 (×4): 100 mg via ORAL
  Filled 2020-07-11 (×5): qty 1

## 2020-07-11 MED ORDER — OXYCODONE HCL 5 MG PO TABS
10.0000 mg | ORAL_TABLET | Freq: Once | ORAL | Status: AC
  Administered 2020-07-11: 10 mg via ORAL
  Filled 2020-07-11: qty 2

## 2020-07-11 MED ORDER — INSULIN ASPART 100 UNIT/ML FLEXPEN: 10.0000 [IU] | PEN_INJECTOR | Freq: Two times a day (BID) | SUBCUTANEOUS | Status: DC

## 2020-07-11 MED ORDER — IPRATROPIUM-ALBUTEROL 0.5-2.5 (3) MG/3ML IN SOLN: 3.0000 mL | Freq: Four times a day (QID) | RESPIRATORY_TRACT | Status: DC | PRN

## 2020-07-11 MED ORDER — FUROSEMIDE 10 MG/ML IJ SOLN
40.0000 mg | Freq: Once | INTRAMUSCULAR | Status: AC
  Administered 2020-07-11: 40 mg via INTRAVENOUS
  Filled 2020-07-11: qty 4

## 2020-07-11 MED ORDER — CYCLOBENZAPRINE HCL 10 MG PO TABS
20.0000 mg | ORAL_TABLET | Freq: Every day | ORAL | Status: DC
  Administered 2020-07-11 – 2020-07-13 (×3): 20 mg via ORAL
  Filled 2020-07-11 (×3): qty 2

## 2020-07-11 MED ORDER — BUDESONIDE 0.5 MG/2ML IN SUSP
2.0000 mL | Freq: Two times a day (BID) | RESPIRATORY_TRACT | Status: DC
  Administered 2020-07-11 – 2020-07-14 (×6): 0.5 mg via RESPIRATORY_TRACT
  Filled 2020-07-11 (×8): qty 2

## 2020-07-11 MED ORDER — ARFORMOTEROL TARTRATE 15 MCG/2ML IN NEBU
15.0000 ug | INHALATION_SOLUTION | Freq: Two times a day (BID) | RESPIRATORY_TRACT | Status: DC
  Administered 2020-07-11 – 2020-07-14 (×6): 15 ug via RESPIRATORY_TRACT
  Filled 2020-07-11 (×8): qty 2

## 2020-07-11 MED ORDER — FERROUS SULFATE 325 (65 FE) MG PO TABS
325.0000 mg | ORAL_TABLET | Freq: Every day | ORAL | Status: DC
  Administered 2020-07-12 – 2020-07-14 (×3): 325 mg via ORAL
  Filled 2020-07-11 (×3): qty 1

## 2020-07-11 MED ORDER — ACETAMINOPHEN 325 MG PO TABS
650.0000 mg | ORAL_TABLET | Freq: Once | ORAL | Status: AC
  Administered 2020-07-11: 650 mg via ORAL
  Filled 2020-07-11: qty 2

## 2020-07-11 MED ORDER — PROMETHAZINE HCL 25 MG PO TABS
25.0000 mg | ORAL_TABLET | Freq: Every day | ORAL | Status: DC
  Administered 2020-07-11 – 2020-07-13 (×3): 25 mg via ORAL
  Filled 2020-07-11 (×3): qty 1

## 2020-07-11 MED ORDER — DILTIAZEM HCL ER COATED BEADS 120 MG PO CP24
120.0000 mg | ORAL_CAPSULE | Freq: Every day | ORAL | Status: DC
  Administered 2020-07-11 – 2020-07-14 (×4): 120 mg via ORAL
  Filled 2020-07-11 (×5): qty 1

## 2020-07-11 MED ORDER — LACTATED RINGERS IV SOLN: INTRAVENOUS | Status: DC

## 2020-07-11 MED ORDER — MONTELUKAST SODIUM 10 MG PO TABS
10.0000 mg | ORAL_TABLET | Freq: Every day | ORAL | Status: DC
  Administered 2020-07-11 – 2020-07-13 (×3): 10 mg via ORAL
  Filled 2020-07-11 (×4): qty 1

## 2020-07-11 MED ORDER — CLOTRIMAZOLE 10 MG MT TROC
10.0000 mg | Freq: Every day | OROMUCOSAL | Status: DC | PRN
  Filled 2020-07-11: qty 1

## 2020-07-11 MED ORDER — FLUTICASONE PROPIONATE 50 MCG/ACT NA SUSP
2.0000 | Freq: Two times a day (BID) | NASAL | Status: DC
  Administered 2020-07-11 – 2020-07-14 (×5): 2 via NASAL
  Filled 2020-07-11: qty 16

## 2020-07-11 MED ORDER — DULOXETINE HCL 30 MG PO CPEP
30.0000 mg | ORAL_CAPSULE | Freq: Every day | ORAL | Status: DC
  Administered 2020-07-11 – 2020-07-14 (×4): 30 mg via ORAL
  Filled 2020-07-11 (×5): qty 1

## 2020-07-11 MED ORDER — ACETAMINOPHEN 650 MG RE SUPP: 650.0000 mg | Freq: Four times a day (QID) | RECTAL | Status: DC | PRN

## 2020-07-11 MED ORDER — INFLUENZA VAC SPLIT QUAD 0.5 ML IM SUSY
0.5000 mL | PREFILLED_SYRINGE | INTRAMUSCULAR | Status: DC
  Filled 2020-07-11: qty 0.5

## 2020-07-11 MED ORDER — MAGNESIUM SULFATE 2 GM/50ML IV SOLN
2.0000 g | Freq: Once | INTRAVENOUS | Status: AC
  Administered 2020-07-11: 2 g via INTRAVENOUS
  Filled 2020-07-11: qty 50

## 2020-07-11 MED ORDER — DOFETILIDE 250 MCG PO CAPS
250.0000 ug | ORAL_CAPSULE | Freq: Two times a day (BID) | ORAL | Status: DC
  Administered 2020-07-11 – 2020-07-14 (×7): 250 ug via ORAL
  Filled 2020-07-11 (×9): qty 1

## 2020-07-11 MED ORDER — SODIUM CHLORIDE 0.9 % IV SOLN
2.0000 g | Freq: Three times a day (TID) | INTRAVENOUS | Status: DC
  Administered 2020-07-11: 2 g via INTRAVENOUS
  Filled 2020-07-11 (×4): qty 2

## 2020-07-11 MED ORDER — PANTOPRAZOLE SODIUM 40 MG PO TBEC
40.0000 mg | DELAYED_RELEASE_TABLET | Freq: Every day | ORAL | Status: DC
  Administered 2020-07-11 – 2020-07-14 (×4): 40 mg via ORAL
  Filled 2020-07-11 (×4): qty 1

## 2020-07-11 MED ORDER — LORAZEPAM 1 MG PO TABS
1.0000 mg | ORAL_TABLET | Freq: Three times a day (TID) | ORAL | Status: DC | PRN
  Administered 2020-07-11 – 2020-07-14 (×4): 1 mg via ORAL
  Filled 2020-07-11 (×4): qty 1

## 2020-07-11 MED ORDER — METHYLPREDNISOLONE SODIUM SUCC 125 MG IJ SOLR
125.0000 mg | Freq: Once | INTRAMUSCULAR | Status: AC
  Administered 2020-07-11: 125 mg via INTRAVENOUS
  Filled 2020-07-11: qty 2

## 2020-07-11 MED ORDER — ACETAMINOPHEN 325 MG PO TABS
650.0000 mg | ORAL_TABLET | Freq: Four times a day (QID) | ORAL | Status: DC | PRN
  Administered 2020-07-11 – 2020-07-13 (×6): 650 mg via ORAL
  Filled 2020-07-11 (×5): qty 2

## 2020-07-11 MED ORDER — CALCITRIOL 0.25 MCG PO CAPS
0.2500 ug | ORAL_CAPSULE | ORAL | Status: DC
  Administered 2020-07-11: 0.25 ug via ORAL
  Filled 2020-07-11 (×2): qty 1

## 2020-07-11 MED ORDER — BASAGLAR KWIKPEN 100 UNIT/ML ~~LOC~~ SOPN
12.0000 [IU] | PEN_INJECTOR | Freq: Every day | SUBCUTANEOUS | Status: DC
  Filled 2020-07-11: qty 3

## 2020-07-11 NOTE — Progress Notes (Addendum)
Pharmacy Antibiotic Note  Katherine [name] is a 64 y.o. female admitted on 07/11/2020 with low O2 sats, possible pneumonia.  Pharmacy has been consulted for cefepime dosing.  Pt is on oxygen at home. PMH s/f COPD and CHF. Also has history of kidney transplant, still makes urine and CrCl ~15mL/min. WBC 8.7, Tmax 100.76F.   Plan: Start cefepime 2g IV every 8 hours Follow up with cultures, antibiotic de-escalation and LOT Monitor renal function and clinical progress     Temp (24hrs), Avg:97 F (36.1 C), Min:97 F (36.1 C), Max:97 F (36.1 C)  Recent Labs  Lab 07/11/20 0750  WBC 8.7  CREATININE 0.99    CrCl cannot be calculated (Unknown ideal weight.).    Allergies  Allergen Reactions  . Tetracycline Hives    Patient tolerated Doxycycline Dec 2020  . Hydromorphone Hcl Nausea And Vomiting  . Niacin Other (See Comments)    Mouth blisters  . Niaspan [Niacin Er] Other (See Comments)    Mouth blisters  . Sulfa Antibiotics Nausea Only and Other (See Comments)    "Tears up stomach"  . Sulfonamide Derivatives Other (See Comments)    Reaction: per patient "tears her stomach up"  . Codeine Nausea And Vomiting  . Erythromycin Nausea And Vomiting  . Hydromorphone Hcl Nausea And Vomiting  . Morphine And Related Nausea And Vomiting  . Nalbuphine Nausea And Vomiting  . Sulfasalazine Nausea Only and Other (See Comments)    per patient "tears her stomach up", "Tears up stomach"  . Tape Rash and Other (See Comments)    No "plastic" tape," please    Antimicrobials this admission: Azithromycin x1 11/25 >>  Cefepime 11/25 >>   Dose adjustments this admission: N/a  Microbiology results: 11/25 BCx: pending 11/25 UCx: pending   Thank you for allowing pharmacy to be a part of this patient's care.  Mercy Riding, PharmD PGY1 Acute Care Pharmacy Resident Please refer to [facility] for unit-specific pharmacist

## 2020-07-11 NOTE — Progress Notes (Signed)
Notified bedside RN on 3E of need for repeat LA.  RN reported lab was in room at this moment drawing repeat LA.

## 2020-07-11 NOTE — H&P (Signed)
History and Physical    Katherine [name] OMV:[id number] DOB: 1956/08/03 DOA: 07/11/2020  Referring MD/NP/PA: Kathrynn Humble, MD PCP: [name], MD  Consultants: Dr. Harding-cardiology Patient coming from: Home via EMS  Chief Complaint: Couldn't breathe  I have personally briefly reviewed patient's old medical records in [place]   HPI: Katherine [name] is a 64 y.o. female with medical history significant of HTN, HLD, PAF on Eliquis, CAD s/p CABG, RA, ESRD s/p renal transplant on chronic immunosuppressive therapy(Imuran and prednisone), persistent asthma, chronic respiratory failure on 3 L, DM type II, and obesity s/p lap band presents with complaints of being unable to breathe.  She was awoken out of her sleep this morning around 5 AM unable to catch her breath.  She states that over the last week she had not been feeling well.  Her and her roommate had gotten into a disagreement and the roommate had started smoking in the house.  Reports associated symptoms of chills, intermittently productive cough, chest pain, leg swelling, and increase in weight.  Chest pain was described as pleuritic in nature, but also describes like a cinderblock was on her chest.  Symptoms feel similar to when she was admitted into the hospital last year for pneumonia.  Patient notes that she had not been taking her diuretics over the last 6 days or so because she felt dehydrated.  En route with EMS patient's O2 sats were noted to be approximately 75% on home O2 of 3 L.  Patient was placed on CPAP after being giving a nebulizer breathing treatment and 1 nitroglycerin.  ED Course: On admission into the emergency department patient was seen to be febrile up to 100.8 F, respirations 19-27, blood pressure elevated up to 200/75, pulse 88-104, and O2 saturations 97 to 100% temporarily on BiPAP.  BiPAP was able to be weaned down to 5 L nasal cannula oxygen with O2 saturation maintained.  COVID-19 and influenza  screening are both negative.  Labs significant for hemoglobin 10.5, glucose 221, magnesium 1.6, BNP 234.  Chest x-ray revealed mild interval increase in interstitial opacities suggestive of edema.  Symptoms initially thought to fluid overload.  However, on reevaluation have been found to have the fever as noted above.  Urinalysis was pending and sepsis protocol was initiated.  Patient was given oxycodone IR 2m, acetaminophen 650 mg, cefepime, and azithromycin.  Review of Systems  Constitutional: Positive for chills and malaise/fatigue.  HENT: Negative for hearing loss and nosebleeds.   Eyes: Negative for photophobia and pain.  Respiratory: Positive for cough, sputum production, shortness of breath and wheezing.   Cardiovascular: Positive for chest pain and leg swelling.  Gastrointestinal: Positive for nausea. Negative for blood in stool and vomiting.  Genitourinary: Negative for dysuria and hematuria.  Musculoskeletal: Positive for back pain. Negative for falls.  Neurological: Negative for focal weakness and loss of consciousness.  Endo/Heme/Allergies: Negative for polydipsia.  Psychiatric/Behavioral: Negative for substance abuse. The patient has insomnia.     Past Medical History:  Diagnosis Date  . Anemia   . Anxiety   . Bilateral carotid artery stenosis    Carotid duplex 76/2836 16-29%LICA, 647-65%RICA, >>46%RECA, f/u 1 yr suggested  . CAD (coronary artery disease) of bypass graft 5/01; 3/'02, 8/'03, 10/'04; 1/15   PCI x 5 to SVG-D1   . CAD in native artery 07/1993   3 Vessel Disease (LAD-D1 & RCA) -- CABG (Dx in setting of inferior STEMI-PTCA of RCA)  . CAD S/P percutaneous  coronary angioplasty    PCI to SVG-D1 insertion/native D1 x 4 = '01 -(S660 BMS 2.5 x 9 anastomosis- D1); '02 - distal overlap ACS Pixel 2.5 x 8  BMS; '03 distal/native ISR/Thrombosis - Pixel 2.5 x 13; '04 - ISR-  Taxus 2.5 x 20 (covered all);; 1/15 - mid SVG-D1 (50% distal ISR) - Promus P 2.75 x 20 -- 2.8 mm  .  COPD mixed type ([place])    Followed by Dr. Lamonte Sakai "pulmonologist said no COPD"  . Depression   . Depression with anxiety   . Diabetes mellitus type 2 in obese ([place])   . Diarrhea    started after cholecystectomy and mass removed from intestine  . Dyslipidemia, goal LDL below 70    08/2012: TC 137, TG 200, HDL 32!, LDL 45; on statin (followed by Dr.Deterding)  . ESRD (end stage renal disease) ([place]) 1991   s/p Cadaveric Renal Transplant [facility] - Dr. Jimmy Footman)   . Family history of adverse reaction to anesthesia    mom's bp dropped during/after anesthesia  . Fibromyalgia   . GERD (gastroesophageal reflux disease)   . Glomerulonephritis, chronic, rapidly progressive 28  . 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 07/1993   Rescue PTCA of RCA -- referred for CABG.  . H/O: GI bleed   . Headache    migraines in the past  . History of CABG x 3 08/1993   Dr. Servando Snare: LIMA-LAD, SVG-bifurcatingD1, SVG-rPDA  . History of kidney stones   . History of stroke 2012   "right eye stroke- half blind now"  . Hypertension associated with diabetes ([place])   . Mild aortic stenosis by prior echocardiogram 07/2019   Echo:  Mild aortic stenosis (gradients: Mean 14.3 mmHg -peak 24.9 mmHg).  . Morbid obesity ([place])   . MRSA (methicillin resistant staph aureus) culture positive   . OSA (obstructive sleep apnea)    no longer on CPAP or home O2, states she doesn't need now after lap band  . PAD (peripheral artery disease) ([place]) 08/2013   LEA Dopplers to be read by Dr. Fletcher Anon  . PAF (paroxysmal atrial fibrillation) ([place]) 06/2014   Noted on CardioNet Monitor  - --> rhythm control with Tikosyn (Dr. Rayann Heman); converted from warfarin to apixaban for anticoagulation.  . Pneumonia   . Recurrent boils    Bilateral Groin  . Rheumatoid arthritis ([place])    Per Patient Report; associated with OA  . S/p cadaver renal transplant [place]  . Unstable angina ([place]) 5/01; 3/'02, 8/'03, 10/'04; 1/15    x 5 occurences since Inf-Post STEMI in 1994    Past Surgical History:  Procedure Laterality Date  . ABDOMINAL AORTOGRAM N/A 04/21/2018   Procedure: ABDOMINAL AORTOGRAM;  Surgeon: [name], MD;  Location: [place] CV LAB;  Service: Cardiovascular;  Laterality: N/A;  . CATHETER REMOVAL    . CHOLECYSTECTOMY N/A 10/29/2014   Procedure: LAPAROSCOPIC CHOLECYSTECTOMY WITH INTRAOPERATIVE CHOLANGIOGRAM;  Surgeon: Excell Seltzer, MD;  Location: [facility];  Service: General;  Laterality: N/A;  . CORONARY ANGIOPLASTY  1994   x5  . CORONARY ARTERY BYPASS GRAFT  1995   LIMA-LAD, SVG-RPDA, SVG-D1  . ESOPHAGOGASTRODUODENOSCOPY N/A 10/15/2016   Procedure: ESOPHAGOGASTRODUODENOSCOPY (EGD);  Surgeon: Wilford Corner, MD;  Location: [facility] ENDOSCOPY;  Service: Endoscopy;  Laterality: N/A;  . I & D EXTREMITY Right 01/29/2018   Procedure: IRRIGATION AND DEBRIDEMENT THUMB;  Surgeon: Dayna Barker, MD;  Location: [place];  Service: Plastics;  Laterality: Right;  .  INCISE AND DRAIN ABCESS    . KIDNEY TRANSPLANT  1991  . KNEE ARTHROSCOPY WITH LATERAL MENISECTOMY Left 12/03/2017   Procedure: LEFT KNEE ARTHROSCOPY WITH LATERAL MENISECTOMY;  Surgeon: Earlie Server, MD;  Location: [place];  Service: Orthopedics;  Laterality: Left;  . LAPAROSCOPIC GASTRIC BANDING  04/2004; 10/'09, 2/'10   Port Replacement x 2  . LEFT HEART CATH AND CORS/GRAFTS ANGIOGRAPHY N/A 04/21/2018   Procedure: LEFT HEART CATH AND CORS/GRAFTS ANGIOGRAPHY;  Surgeon: [name], MD;  Location: [place] [facility];  Ost-Prox LAD 50% - proxLAD (pre & post D1) 100% CTO. Cx - patent, small OM1 (stable ~ ostial OM1 90%, too small for PCI) & 2 small LPL; Ost-distal RCA 100% CTO.  LIMA-LAD (not injected); SVG-dRCA patent, SVG-D1 - insertion stent ~20% ISR - Severe R CFA disease w/ focal Sub TO  . LEFT HEART CATH AND CORS/GRAFTS ANGIOGRAPHY  5/'01, 3/'02, 8/'03, 10/'04; 1/'15   08/22/2013: LAD & RCA 100%; LIMA-LAD & SVG-rPDA patent; Cx-- OM1 60%, OM2  ostial ~50%; SVG-D1 - 80% mid, 50% distal ISR --PCI  . LEFT HEART CATHETERIZATION WITH CORONARY/GRAFT ANGIOGRAM N/A 08/23/2013   Procedure: LEFT HEART CATHETERIZATION WITH Beatrix Fetters;  Surgeon: Wellington Hampshire, MD;  Location: [place] CATH LAB;  Service: Cardiovascular;  Laterality: N/A;  . Lower Extremity Arterial Dopplers  08/2013   ABI: R 0.96, L 1.04  . MULTIPLE TOOTH EXTRACTIONS  age 55  . NM MYOVIEW LTD  03/2016   EF 62%. LOW RISK. C/W prior MI - no Ischemia. Apical hypokinesis.  [name] Kitchen PERCUTANEOUS CORONARY STENT INTERVENTION (PCI-S)  5/'01, 3/'02, 8/'03, 10/'04;   '01 - S660 BMS 2.5 x 9 - dSVG-D1 into D1; '02- post-stent stenosis - 2.5 x 8 Pixel BMS; '8\03: ISR/Thrombosis into native D1 - AngioJet, 2.5 x 13 Pixel; '04 - ISR 95% - covered stented area with Taxus DES 2.5 mm x 20 (2.88)  . PERCUTANEOUS CORONARY STENT INTERVENTION (PCI-S)  08/23/2013   Procedure: PERCUTANEOUS CORONARY STENT INTERVENTION (PCI-S);  Surgeon: Wellington Hampshire, MD;  Location[facility] CATH LAB;  Service: Cardiovascular;;mid SVG-D1 80%; distal stent ~50% ISR; Promus Prermier DES 2.75 mm xc 20 mm (2.8 mm)  . PORT-A-CATH REMOVAL     kidney  . TRANSTHORACIC ECHOCARDIOGRAM  07/2019   EF 55 to 60%.  No LVH.  Paradoxical septal wall motion-post CABG.  GRII DD.  Normal RV size and function.  Mild bilateral atrial dilation.  Moderate MAC.  Trace MR.  Mild aortic stenosis (gradients: Mean 14.3 mmHg -peak 24.9 mmHg).  . TUBAL LIGATION    . wrist fistula repair Left    dialysis for one year     reports that she quit smoking about 17 years ago. Her smoking use included cigarettes. She has a 30.00 pack-year smoking history. She has never used smokeless tobacco. She reports that she does not drink alcohol and does not use drugs.  Allergies  Allergen Reactions  . Tetracycline Hives    Patient tolerated Doxycycline Dec 2020  . Hydromorphone Hcl Nausea And Vomiting  . Niacin Other (See Comments)    Mouth blisters  . Niaspan  [Niacin Er] Other (See Comments)    Mouth blisters  . Sulfa Antibiotics Nausea Only and Other (See Comments)    "Tears up stomach"  . Sulfonamide Derivatives Other (See Comments)    Reaction: per patient "tears her stomach up"  . Codeine Nausea And Vomiting  . Erythromycin Nausea And Vomiting  . Hydromorphone Hcl Nausea And Vomiting  .  Morphine And Related Nausea And Vomiting  . Nalbuphine Nausea And Vomiting  . Sulfasalazine Nausea Only and Other (See Comments)    per patient "tears her stomach up", "Tears up stomach"  . Tape Rash and Other (See Comments)    No "plastic" tape," please    Family History  Problem Relation Age of Onset  . Cancer Mother        liver  . Heart disease Father   . Cancer Father        colon  . Arrhythmia Brother        Atrial Fibrillation  . Arrhythmia Paternal Aunt        Atrial Fibrillation    Prior to Admission medications   Medication Sig Start Date End Date Taking? Authorizing Provider  albuterol (PROVENTIL) (2.5 MG/3ML) 0.083% nebulizer solution Take 3 mLs (2.5 mg total) by nebulization every 6 (six) hours as needed for wheezing or shortness of breath (dx: J45.20). 03/15/19   Parrett, Tammy S, NP  albuterol (VENTOLIN HFA) 108 (90 Base) MCG/ACT inhaler TAKE 2 PUFFS BY MOUTH EVERY 6 HOURS AS NEEDED FOR WHEEZE OR SHORTNESS OF BREATH 01/22/20   Collene Gobble, MD  arformoterol (BROVANA) 15 MCG/2ML NEBU Take 2 mLs (15 mcg total) by nebulization 2 (two) times daily. 08/30/19   Collene Gobble, MD  azaTHIOprine (IMURAN) 50 MG tablet Take 125 mg by mouth every evening. Taking 2 & 1/2 tabs (144m) qd    [provider]  B-D ULTRAFINE III SHORT PEN 31G X 8 MM MISC See admin instructions. 11/06/19   [provider]  Blood Glucose Monitoring Suppl (ONE TOUCH ULTRA 2) w/Device KIT 2 (two) times daily. 04/08/20   [provider]  budesonide (PULMICORT) 0.5 MG/2ML nebulizer solution Take 2 mLs (0.5 mg total) by nebulization 2 (two) times  daily. 08/24/19   JJanith Lima MD  calcitRIOL (ROCALTROL) 0.25 MCG capsule Take 0.25 mcg by mouth every 3 (three) days.     [provider]  clotrimazole (MYCELEX) 10 MG troche Take 1 tablet (10 mg total) by mouth 5 (five) times daily. 11/28/18   Parrett, TFonnie Mu NP  cyclobenzaprine (FLEXERIL) 10 MG tablet TAKE 1 TABLET BY MOUTH 3 TIMES DAILY AS NEEDED FOR MUSCLE SPASMS Patient taking differently: Take 10 mg by mouth 2 (two) times daily.  10/19/17   SMeredith Staggers MD  diltiazem (CARDIZEM CD) 120 MG 24 hr capsule TAKE 1 CAPSULE BY MOUTH EVERY DAY Patient taking differently: Take 120 mg by mouth daily.  12/07/19   HLeonie Man MD  diltiazem (CARDIZEM) 30 MG tablet TAKE 1 TABLET EVERY 4 HOURS AS NEEDED FOR AFIB HEART RATE >100 06/03/20   CSherran Needs NP  dofetilide (TIKOSYN) 250 MCG capsule TAKE 1 CAPSULE BY MOUTH TWICE A DAY Patient taking differently: Take 250 mcg by mouth 2 (two) times daily. TAKE 1 CAPSULE BY MOUTH TWICE A DAY 10/27/19   CSherran Needs NP  DULoxetine (CYMBALTA) 30 MG capsule Take 30 mg by mouth daily.    [provider]  ELIQUIS 5 MG TABS tablet TAKE 1 TABLET BY MOUTH TWICE A DAY (TO REPLACE WARFARIN) 03/29/20   HLeonie Man MD  ferrous sulfate 325 (65 FE) MG tablet Take 1 tablet (325 mg total) by mouth daily with breakfast. 08/14/19   HKayleen Memos DO  fluticasone (FLONASE) 50 MCG/ACT nasal spray PLACE 2 SPRAYS INTO BOTH NOSTRILS 2 TIMES DAILY 03/05/20   BCollene Gobble MD  furosemide (LASIX) 40 MG tablet Take 40 mg by mouth 2 (two) times daily.    [provider]  glimepiride (AMARYL) 4 MG tablet Take 4 mg by mouth daily. 02/03/20   [provider]  Insulin Glargine (BASAGLAR KWIKPEN) 100 UNIT/ML SOPN Inject 12 Units into the skin daily.    [provider]  lamoTRIgine (LAMICTAL) 25 MG tablet TAKE 1 TABLET BY MOUTH EVERYDAY AT BEDTIME 06/03/20   Meredith Staggers, MD  Lancets (ONETOUCH DELICA PLUS [id number]) MISC  daily. as directed 03/26/20   [provider]  LORazepam (ATIVAN) 1 MG tablet Take 1 tablet (1 mg total) by mouth every 8 (eight) hours as needed for anxiety. Takes daily 02/27/20   [name], MD  magnesium oxide (MAG-OX) 400 MG tablet Take 1 tablet (400 mg total) by mouth daily. 04/18/20   Sherran Needs, NP  metFORMIN (GLUCOPHAGE) 500 MG tablet Take 500 mg by mouth 2 (two) times daily. 10/03/16   [provider]  metoprolol tartrate (LOPRESSOR) 50 MG tablet TAKE 1 TABLET BY MOUTH TWICE A DAY 05/09/20   Sherran Needs, NP  montelukast (SINGULAIR) 10 MG tablet TAKE 1 TABLET BY MOUTH EVERY DAY 05/30/20   Byrum, Rose Fillers, MD  nitroGLYCERIN (NITROSTAT) 0.4 MG SL tablet Place 1 tablet (0.4 mg total) under the tongue every 5 (five) minutes as needed for chest pain. 09/11/13   [name], MD  NOVOLOG FLEXPEN 100 UNIT/ML FlexPen Inject 4 Units into the skin 2 (two) times daily after a meal.  12/13/17   [provider]  omeprazole (PRILOSEC) 20 MG capsule Take 20 mg by mouth daily.    [provider]  [facility] ULTRA test strip USE AS DIRECTED 3 TIMES DAILY TO CHECK BLOOD SUGAR 11/04/19   [provider]  Oxycodone HCl 10 MG TABS Take 1 tablet (10 mg total) by mouth every 8 (eight) hours as needed. 06/21/20   Meredith Staggers, MD  predniSONE (DELTASONE) 5 MG tablet Take 5 mg by mouth daily. 06/19/19   [provider]  promethazine (PHENERGAN) 25 MG tablet Take 25 mg by mouth at bedtime. Or for sleep 07/14/19   [provider]  rosuvastatin (CRESTOR) 20 MG tablet Take 1 tablet (20 mg total) by mouth daily. 08/29/19   [name], MD  sertraline (ZOLOFT) 50 MG tablet Take 2 tablets (100 mg total) by mouth daily. 04/12/20 04/12/21  Sherran Needs, NP    Physical Exam:  Constitutional: Obese female who appears to be in some respiratory distress Vitals:   07/11/20 0945 07/11/20 1000 07/11/20 1015 07/11/20 1030  BP: (!) 198/80 (!) 189/79  (!) 200/75 (!) 186/77  Pulse: 94 90 93 97  Resp: (!) 25 (!) 24 (!) 23 20  Temp:      TempSrc:      SpO2: 100% 100% 100% 98%   Eyes: PERRL, lids and conjunctivae normal ENMT: Mucous membranes are moist. Posterior pharynx clear of any exudate or lesions. Neck: normal, supple, no masses, no thyromegaly Respiratory: Tachypneic with positive Cardiovascular: Regular rate and rhythm, no murmurs / rubs / gallops. No extremity edema. 2+ pedal pulses. No carotid bruits.  Abdomen: no tenderness, no masses palpated. No hepatosplenomegaly. Bowel sounds positive.  Musculoskeletal: no clubbing / cyanosis. No joint deformity upper and lower extremities. Good ROM, no contractures. Normal muscle tone.  Skin: no rashes, lesions, ulcers. No induration Neurologic: CN 2-12 grossly intact. Sensation intact, DTR normal. Strength 5/5 in all 4.  Psychiatric: Normal judgment and insight. Alert and oriented x 3. Normal mood.     Labs on Admission: I have personally reviewed following labs and imaging studies  CBC: Recent Labs  Lab 07/11/20 0750  WBC 8.7  HGB 10.5*  HCT 34.9*  MCV 99.4  PLT 702   Basic Metabolic Panel: Recent Labs  Lab 07/11/20 0750  NA 139  K 4.0  CL 102  CO2 25  GLUCOSE 221*  BUN 13  CREATININE 0.99  CALCIUM 9.3  MG 1.6*   GFR: CrCl cannot be calculated (Unknown ideal weight.). Liver Function Tests: No results for input(s): AST, ALT, ALKPHOS, BILITOT, PROT, ALBUMIN in the last 168 hours. No results for input(s): LIPASE, AMYLASE in the last 168 hours. No results for input(s): AMMONIA in the last 168 hours. Coagulation Profile: No results for input(s): INR, PROTIME in the last 168 hours. Cardiac Enzymes: No results for input(s): CKTOTAL, CKMB, CKMBINDEX, TROPONINI in the last 168 hours. BNP (last 3 results) No results for input(s): PROBNP in the last 8760 hours. HbA1C: No results for input(s): HGBA1C in the last 72 hours. CBG: No results for input(s): GLUCAP in the  last 168 hours. Lipid Profile: No results for input(s): CHOL, HDL, LDLCALC, TRIG, CHOLHDL, LDLDIRECT in the last 72 hours. Thyroid Function Tests: No results for input(s): TSH, T4TOTAL, FREET4, T3FREE, THYROIDAB in the last 72 hours. Anemia Panel: No results for input(s): VITAMINB12, FOLATE, FERRITIN, TIBC, IRON, RETICCTPCT in the last 72 hours. Urine analysis:    Component Value Date/Time   COLORURINE YELLOW 08/08/2019 1722   APPEARANCEUR CLEAR 08/08/2019 1722   LABSPEC 1.011 08/08/2019 1722   PHURINE 9.0 (H) 08/08/2019 1722   GLUCOSEU NEGATIVE 08/08/2019 1722   HGBUR NEGATIVE 08/08/2019 1722   BILIRUBINUR NEGATIVE 08/08/2019 1722   KETONESUR NEGATIVE 08/08/2019 1722   PROTEINUR NEGATIVE 08/08/2019 1722   UROBILINOGEN 0.2 10/16/2013 0110   NITRITE NEGATIVE 08/08/2019 1722   LEUKOCYTESUR NEGATIVE 08/08/2019 1722   Sepsis Labs: Recent Results (from the past 240 hour(s))  Resp Panel by RT-PCR (Flu A&B, Covid) Nasopharyngeal Swab     Status: None   Collection Time: 07/11/20  7:24 AM   Specimen: Nasopharyngeal Swab; Nasopharyngeal(NP) swabs in vial transport medium  Result Value Ref Range Status   SARS Coronavirus 2 by RT PCR NEGATIVE NEGATIVE Final    Comment: (NOTE) SARS-CoV-2 target nucleic acids are NOT DETECTED.  The SARS-CoV-2 RNA is generally detectable in upper respiratory specimens during the acute phase of infection. The lowest concentration of SARS-CoV-2 viral copies this assay can detect is 138 copies/mL. A negative result does not preclude SARS-Cov-2 infection and should not be used as the sole basis for treatment or other patient management decisions. A negative result may occur with  improper specimen collection/handling, submission of specimen other than nasopharyngeal swab, presence of viral mutation(s) within the areas targeted by this assay, and inadequate number of viral copies(<138 copies/mL). A negative result must be combined with clinical observations,  patient history, and epidemiological information. The expected result is Negative.  Fact Sheet for Patients:  EntrepreneurPulse.com.au  Fact Sheet for Healthcare Providers:  IncredibleEmployment.be  This test is no t yet approved or cleared by the [place] FDA and  has been authorized for detection and/or diagnosis of SARS-CoV-2 by FDA under an Emergency Use Authorization (EUA). This EUA will remain  in effect (meaning this test can be used) for the duration of the COVID-19 declaration under Section 564(b)(1) of the Act, 21 U.S.C.section 360bbb-3(b)(1), unless the  authorization is terminated  or revoked sooner.       Influenza A by PCR NEGATIVE NEGATIVE Final   Influenza B by PCR NEGATIVE NEGATIVE Final    Comment: (NOTE) The Xpert Xpress SARS-CoV-2/FLU/RSV plus assay is intended as an aid in the diagnosis of influenza from Nasopharyngeal swab specimens and should not be used as a sole basis for treatment. Nasal washings and aspirates are unacceptable for Xpert Xpress SARS-CoV-2/FLU/RSV testing.  Fact Sheet for Patients: EntrepreneurPulse.com.au  Fact Sheet for Healthcare Providers: IncredibleEmployment.be  This test is not yet approved or cleared by the [place] FDA and has been authorized for detection and/or diagnosis of SARS-CoV-2 by FDA under an Emergency Use Authorization (EUA). This EUA will remain in effect (meaning this test can be used) for the duration of the COVID-19 declaration under Section 564(b)(1) of the Act, 21 U.S.C. section 360bbb-3(b)(1), unless the authorization is terminated or revoked.  Performed at [facility], [place] [address] [place], [place] [address]      Radiological Exams on Admission: DG Chest Portable 1 View  Result Date: 07/11/2020 CLINICAL DATA:  Patient with decreased oxygen saturation. EXAM: PORTABLE CHEST 1 VIEW COMPARISON:  Chest radiograph  12/25/2019. FINDINGS: Monitoring leads overlie the patient. Stable enlarged cardiac and mediastinal contours status post median sternotomy. Slight interval increase in interstitial opacities bilaterally. No large pleural effusion or pneumothorax. Thoracic spine degenerative changes. IMPRESSION: Mild interval increase in interstitial opacities suggestive of edema. Electronically Signed   By: Lovey Newcomer M.D.   On: 07/11/2020 08:05    EKG: Independently reviewed.  Sinus rhythm at 93 bpm with first-degree heart block and there appears to be a new left bundle branch block.  Assessment/Plan SIRS: Acute.  Patient was noted to initially be febrile up to 100.8 F with tachycardia and tachypnea meeting SIRS correct.  Chest x-ray concerning possibly for edema.  Urinalysis was still pending.  No clear source of infection at this time. -Admit to the medical telemetry -Follow-up blood cultures and urinalysis -Check procalcitonin -Continue empiric antibiotics of azithromycin and cefepime  Respiratory failure with hypoxia asthma exacerbation: Acute on chronic.  Patient presents with complaints with acute shortness of breath.  Symptoms may have been triggered by her roommate smoking around her recently.  Followed in outpatient setting by Dr. Lamonte Sakai, but patient has not followed up for pulmonary function testing as of yet. -Continuous pulse oximetry with nasal cannula oxygen to maintain O2 saturation greater than 92% -Peak flow monitor -DuoNebs 4 times daily -Solu-Medrol 125 mg x 1 dose now, and start p.o. prednisone in a.m.  -Continue budesonide and Brovana twice daily -Patient needs to follow-up with Dr. Lamonte Sakai in the outpatient setting   Chest pain Left bundle branch block: Patient reports having pleuritic-like chest pain.  Noted to have what appears to be a possible new left bundle branch block on EKG she is on chronic anticoagulation for which pulmonary embolus seems less likely.  Patient does have a prior  history of coronary artery disease status post CABG. -Check troponin -Check echocardiogram -Consider formally consulting cardiology for further evaluation in a.m.  Diastolic congestive heart failure: Chronic.  Patient does not appear grossly fluid overloaded on physical exam.  BNP is mildly elevated to 234.9, but lower than previous.  However she does note that she was not taking her home Lasix for the last 6 days.  Last echocardiogram revealed EF of 55 to 60% with grade 3 diastolic dysfunction and mild to moderate aortic stenosis.   -  Strict intake and output -Daily weights -Give Lasix 40 mg IV x1 dose -Reevaluate in a.m. to determine if need for continued IV diuresis or okay to restart p.o.   Paroxysmal atrial fibrillation on chronic anticoagulation: Patient currently appears to be in sinus rhythm. CHA2DS2-VASc score =5 -Continue Tikosyn, metoprolol, Eliquis  Diabetes mellitus type 2: Patient presents with glucose elevated up to 221.  She had previously been controlled with last hemoglobin A1c 5.6 in 12/2019. -Hypoglycemic protocol -Hold Metformin and Amaryl -Continue home regimen of glargine 12 units daily -CBGs before every meal with resistant SSI  Normocytic anemia: Hemoglobin 10.5 on admission which appears near her baseline.  Patient denies any reports of bleeding. -Continue to monitor blood counts   Hypomagnesemia: Acute.  Initial magnesium level 1.6. -Give 2 g of magnesium sulfate IV  History of renal transplant on chronic immunosuppressive therapy -Continue home regimen Imuran -Prednisone as noted above, but we will need to continue prednisone follow-up once taper is completed  Anxiety/depression/fibromyalgia/chronic pain -Continue home regimen of oxycodone, Lamictal, Cymbalta, and Zoloft  Morbid obesity: BMI 43.99 kg/m.  Patient with history of lap band  DVT prophylaxis: Eliquis Code Status: full Family Communication: No family requested to be updated at this  time Disposition Plan: likely discharge home once medically stable   Consults called: None  Admission status: inpatient, requiring more than 2 midnight stay and work-up of possible infectious cause of symptoms.  Norval Morton MD [facility] Pager [phone]   If 7PM-7AM, please contact night-coverage [email or web address] Password [facility]  07/11/2020, 10:55 AM

## 2020-07-11 NOTE — Progress Notes (Signed)
Notified bedside nurse of need to draw repeat lactic acid. 

## 2020-07-11 NOTE — Progress Notes (Signed)
elink monitoring sepsis protocol.  

## 2020-07-11 NOTE — ED Triage Notes (Signed)
Pt here from home arrived on cpap 75% sats at home , on Home O2 , chf and copd hx and there is a smoker in house , neb at home and 1 nitro

## 2020-07-12 ENCOUNTER — Inpatient Hospital Stay (HOSPITAL_COMMUNITY): Payer: Medicare Other

## 2020-07-12 DIAGNOSIS — R9431 Abnormal electrocardiogram [ECG] [EKG]: Secondary | ICD-10-CM | POA: Diagnosis not present

## 2020-07-12 DIAGNOSIS — I340 Nonrheumatic mitral (valve) insufficiency: Secondary | ICD-10-CM | POA: Diagnosis not present

## 2020-07-12 LAB — COMPREHENSIVE METABOLIC PANEL
ALT: 20 U/L (ref 0–44)
AST: 21 U/L (ref 15–41)
Albumin: 3.2 g/dL — ABNORMAL LOW (ref 3.5–5.0)
Alkaline Phosphatase: 95 U/L (ref 38–126)
Anion gap: 15 (ref 5–15)
BUN: 17 mg/dL (ref 8–23)
CO2: 22 mmol/L (ref 22–32)
Calcium: 9.5 mg/dL (ref 8.9–10.3)
Chloride: 100 mmol/L (ref 98–111)
Creatinine, Ser: 0.98 mg/dL (ref 0.44–1.00)
GFR, Estimated: >60 mL/min (ref >60)
Glucose, Bld: 236 mg/dL — ABNORMAL HIGH (ref 70–99)
Potassium: 4.6 mmol/L (ref 3.5–5.1)
Sodium: 137 mmol/L (ref 135–145)
Total Bilirubin: 0.9 mg/dL (ref 0.3–1.2)
Total Protein: 7.1 g/dL (ref 6.5–8.1)

## 2020-07-12 LAB — ECHOCARDIOGRAM COMPLETE
AR max vel: 1.98 cm2
AV Area VTI: 2.05 cm2
AV Area mean vel: 2.07 cm2
AV Mean grad: 11 mmHg
AV Peak grad: 22.3 mmHg
Ao pk vel: 2.36 m/s
Area-P 1/2: 4.68 cm2
Calc EF: 38.4 %
Height: 61 in
MV M vel: 4.71 m/s
MV Peak grad: 88.7 mmHg
S' Lateral: 2.2 cm
Single Plane A2C EF: 35.9 %
Single Plane A4C EF: 37.7 %
Weight: 3665.6 oz

## 2020-07-12 LAB — GLUCOSE, CAPILLARY
Glucose-Capillary: 256 mg/dL — ABNORMAL HIGH (ref 70–99)
Glucose-Capillary: 302 mg/dL — ABNORMAL HIGH (ref 70–99)
Glucose-Capillary: 215 mg/dL — ABNORMAL HIGH (ref 70–99)

## 2020-07-12 LAB — MAGNESIUM: Magnesium: 2 mg/dL (ref 1.7–2.4)

## 2020-07-12 LAB — CBC
HCT: 33.4 % — ABNORMAL LOW (ref 36.0–46.0)
Hemoglobin: 10.7 g/dL — ABNORMAL LOW (ref 12.0–15.0)
MCH: 30.2 pg (ref 26.0–34.0)
MCHC: 32 g/dL (ref 30.0–36.0)
MCV: 94.4 fL (ref 80.0–100.0)
Platelets: 214 10*3/uL (ref 150–400)
RBC: 3.54 MIL/uL — ABNORMAL LOW (ref 3.87–5.11)
RDW: 15.5 % (ref 11.5–15.5)
WBC: 8.6 10*3/uL (ref 4.0–10.5)
nRBC: 0 % (ref 0.0–0.2)

## 2020-07-12 MED ORDER — PREDNISONE 5 MG PO TABS
5.0000 mg | ORAL_TABLET | Freq: Every day | ORAL | Status: DC
  Administered 2020-07-13 – 2020-07-14 (×2): 5 mg via ORAL
  Filled 2020-07-12 (×2): qty 1

## 2020-07-12 MED ORDER — PERFLUTREN LIPID MICROSPHERE
1.0000 mL | INTRAVENOUS | Status: AC | PRN
  Administered 2020-07-12: 2 mL via INTRAVENOUS
  Filled 2020-07-12: qty 10

## 2020-07-12 MED ORDER — IPRATROPIUM-ALBUTEROL 0.5-2.5 (3) MG/3ML IN SOLN
3.0000 mL | Freq: Two times a day (BID) | RESPIRATORY_TRACT | Status: DC
  Administered 2020-07-12 – 2020-07-14 (×4): 3 mL via RESPIRATORY_TRACT
  Filled 2020-07-12 (×4): qty 3

## 2020-07-12 MED ORDER — AZATHIOPRINE 50 MG PO TABS
125.0000 mg | ORAL_TABLET | Freq: Every day | ORAL | Status: DC
  Administered 2020-07-12 – 2020-07-14 (×3): 125 mg via ORAL
  Filled 2020-07-12 (×3): qty 3

## 2020-07-12 MED ORDER — FUROSEMIDE 10 MG/ML IJ SOLN
40.0000 mg | Freq: Two times a day (BID) | INTRAMUSCULAR | Status: DC
  Administered 2020-07-12 – 2020-07-14 (×5): 40 mg via INTRAVENOUS
  Filled 2020-07-12 (×5): qty 4

## 2020-07-12 NOTE — Assessment & Plan Note (Signed)
Continue Eliquis -Continue Cardizem, metoprolol, Tikosyn -Followed outpatient by cardiology

## 2020-07-12 NOTE — Assessment & Plan Note (Signed)
-  Hemoglobin at baseline -Monitor CBC while in hospital

## 2020-07-12 NOTE — Assessment & Plan Note (Signed)
-  Replete and recheck as needed 

## 2020-07-12 NOTE — Assessment & Plan Note (Addendum)
-  Likely due to volume overload in setting of being off of Lasix for approximately 1 week prior to admission.  With negative procalcitonin, no leukocytosis (understand she is on immunosuppressive therapy), very low-grade fever without persistence, will discontinue antibiotics for now and monitor off -Continue oxygen, she has been weaned down to home setting of 3 L -Given some appearance of her sputum being brown/yellow just prior to discharge, she was given a 5-day course of Augmentin to complete upon returning home

## 2020-07-12 NOTE — Assessment & Plan Note (Signed)
-   Continue SSI and CBG monitoring ?

## 2020-07-12 NOTE — Assessment & Plan Note (Addendum)
-  Patient off of Lasix for approximately 1 week when home due to feeling dehydrated.  She exhibited signs of volume overload on admission with elevated BNP, pulmonary edema on CXR, lower extremity edema, and shortness of breath -Continue Lasix and monitor output -Last echo showed EF 55 to 60% with grade 3 diastolic dysfunction -Continues to diurese well

## 2020-07-12 NOTE — Assessment & Plan Note (Signed)
-   no further wheezing and her SOB is resolved - continue O2 and inhalers - reduce prednisone back to home 5 mg daily

## 2020-07-12 NOTE — Progress Notes (Signed)
Inpatient Diabetes Program Recommendations  AACE/ADA: New Consensus Statement on Inpatient Glycemic Control (2015)  Target Ranges:  Prepandial:   less than 140 mg/dL      Peak postprandial:   less than 180 mg/dL (1-2 hours)      Critically ill patients:  140 - 180 mg/dL   Lab Results  Component Value Date   GLUCAP 256 (H) 07/12/2020   HGBA1C 7.7 (H) 07/11/2020    Review of Glycemic Control Results for HALENA, MOHAR (MRN [id number]) as of 07/12/2020 11:29  Ref. Range 07/11/2020 16:47 07/11/2020 21:39 07/12/2020 06:34  Glucose-Capillary Latest Ref Range: 70 - 99 mg/dL 290 (H) 290 (H) 256 (H)   Diabetes history: DM Outpatient Diabetes medications:  Basaglar 12 units daily Novolog 10 units bid after meals Amaryl 4 mg daily Current orders for Inpatient glycemic control:  Lantus 12 units qam, Novolog 0-20 units tid, Solu-medrol 125mg  on 11/25, Prednisone 5 mg daily to start on 11/27  Inpatient Diabetes Program Recommendations:     Lantus 16 units qam  Novolog 0-5 units qhs  Will continue to follow while inpatient.  Thank you, Reche Dixon, RN, BSN Diabetes Coordinator Inpatient Diabetes Program [phone] (team pager from 8a-5p)

## 2020-07-12 NOTE — Assessment & Plan Note (Signed)
-   ongoing lifestyle modification as able

## 2020-07-12 NOTE — Progress Notes (Signed)
PROGRESS NOTE    Katherine [name]   ZJI:[id number]  DOB: 1955/12/10  DOA: 07/11/2020     1  PCP: [name], MD  CC: SOB, swelling  Hospital Course: Katherine [name] is a 64 y.o. female with medical history significant of HTN, HLD, PAF on Eliquis, CAD s/p CABG, RA, ESRD s/p renal transplant on chronic immunosuppressive therapy(Imuran and prednisone 5mg  daily), persistent asthma, chronic respiratory failure on 3 L, DM type II, and obesity s/p lap band who presented with SOB.   She had not been feeling well for approximately a week prior to admission.  She has also been having difficulty with breathing in her house due to her roommate smoking inside.  She had associated symptoms of a productive cough which is chronic for her and productive typically of brown/green sputum.  She has had worsened swelling in her legs and increased weight. She had been off of Lasix for approximately 1 week due to feeling dehydrated. In the ER she was found to be hypoxic (she is on 3 L chronically at home).  She was started on Lasix and began diuresing well.  CXR was also concerning for some interstitial opacities most likely edema but some concern for pneumonia on admission.  She was started on cefepime and azithromycin.   Interval History:  Admitted overnight.  Has been voiding well and states that her breathing has improved some.  Still a little bit above home oxygen setting; wears 3 L at home and currently on 4 L this morning.  Old records reviewed in assessment of this patient  ROS: Constitutional: negative for chills and fevers, Respiratory: positive for cough and SOB, Cardiovascular: negative for chest pain and Gastrointestinal: negative for abdominal pain  Assessment & Plan: * Acute on chronic respiratory failure with hypoxia ([place]) -Likely due to volume overload in setting of being off of Lasix for approximately 1 week prior to admission.  With negative procalcitonin, no leukocytosis  (understand she is on immunosuppressive therapy), very low-grade fever without persistence, will discontinue antibiotics for now and monitor off -Continue oxygen, she is currently on 4L; attempt to wean to home setting of 3L  Acute on chronic diastolic heart failure ([place]) -Patient off of Lasix for approximately 1 week when home due to feeling dehydrated.  She exhibited signs of volume overload on admission with elevated BNP, pulmonary edema on CXR, lower extremity edema, and shortness of breath -Continue Lasix and monitor output -Last echo showed EF 55 to 60% with grade 3 diastolic dysfunction  Asthma exacerbation - no further wheezing and her SOB is resolved - continue O2 and inhalers - reduce prednisone back to home 5 mg daily  Paroxysmal atrial fibrillation (HCC); CHA2DS2VASc score F, HTN, CAD, CVA = 5 Continue Eliquis -Continue Cardizem, metoprolol, Tikosyn -Followed outpatient by cardiology  SIRS (systemic inflammatory response syndrome) (HCC) -No obvious source of infection -Discontinue antibiotics.  Trend procalcitonin -Suspect that her clinical presentation more consistent with volume overload and possible asthma exacerbation.  She is nontoxic-appearing and much improved this morning  Normocytic anemia -Hemoglobin at baseline -Monitor CBC while in hospital  Type 2 diabetes mellitus with peripheral neuropathy ([place]) -Continue SSI and CBG monitoring  Hypomagnesemia -Replete and recheck as needed  Morbid obesity - s/p Lap Band 9/'05 - ongoing lifestyle modification as able     Antimicrobials: Cefepime, stopped 11/26 Azithro, stopped 11/26  DVT prophylaxis: Eliquis Code Status: Full Family Communication: none present Disposition Plan: Status is: Inpatient  Remains inpatient appropriate because:Unsafe d/c  plan, IV treatments appropriate due to intensity of illness or inability to take PO and Inpatient level of care appropriate due to severity of illness   Dispo:  The patient is from: Home              Anticipated d/c is to: Home              Anticipated d/c date is: 2 days              Patient currently is not medically stable to d/c.       Objective: Blood pressure (!) 160/74, pulse 83, temperature 98.1 F (36.7 C), temperature source Oral, resp. rate 20, height 5\' 1"  (1.549 m), weight 103.9 kg, SpO2 94 %.  Examination: General appearance: alert, cooperative and no distress Head: Normocephalic, without obvious abnormality, atraumatic Eyes: EOMI Lungs: Bibasilar crackles, no wheezing Heart: irregularly irregular rhythm and S1, S2 normal Abdomen: normal findings: bowel sounds normal and soft, non-tender Extremities: 1+ lower extremity edema Skin: mobility and turgor normal Neurologic: Grossly normal  Consultants:   None  Procedures:   None  Data Reviewed: I have personally reviewed following labs and imaging studies Results for orders placed or performed during the hospital encounter of 07/11/20 (from the past 24 hour(s))  Lactic acid, plasma     Status: Abnormal   Collection Time: 07/11/20  2:36 PM  Result Value Ref Range   Lactic Acid, Venous 2.1 (HH) 0.5 - 1.9 mmol/L  Troponin I (High Sensitivity)     Status: Abnormal   Collection Time: 07/11/20  2:36 PM  Result Value Ref Range   Troponin I (High Sensitivity) 35 (H) <18 ng/L  Glucose, capillary     Status: Abnormal   Collection Time: 07/11/20  4:47 PM  Result Value Ref Range   Glucose-Capillary 290 (H) 70 - 99 mg/dL   Comment 1 Notify RN    Comment 2 Document in Chart   Glucose, capillary     Status: Abnormal   Collection Time: 07/11/20  9:39 PM  Result Value Ref Range   Glucose-Capillary 290 (H) 70 - 99 mg/dL  CBC     Status: Abnormal   Collection Time: 07/12/20  3:55 AM  Result Value Ref Range   WBC 8.6 4.0 - 10.5 K/uL   RBC 3.54 (L) 3.87 - 5.11 MIL/uL   Hemoglobin 10.7 (L) 12.0 - 15.0 g/dL   HCT 33.4 (L) 36 - 46 %   MCV 94.4 80.0 - 100.0 fL   MCH 30.2 26.0 -  34.0 pg   MCHC 32.0 30.0 - 36.0 g/dL   RDW 15.5 11.5 - 15.5 %   Platelets 214 150 - 400 K/uL   nRBC 0.0 0.0 - 0.2 %  Comprehensive metabolic panel     Status: Abnormal   Collection Time: 07/12/20  3:55 AM  Result Value Ref Range   Sodium 137 135 - 145 mmol/L   Potassium 4.6 3.5 - 5.1 mmol/L   Chloride 100 98 - 111 mmol/L   CO2 22 22 - 32 mmol/L   Glucose, Bld 236 (H) 70 - 99 mg/dL   BUN 17 8 - 23 mg/dL   Creatinine, Ser 0.98 0.44 - 1.00 mg/dL   Calcium 9.5 8.9 - 10.3 mg/dL   Total Protein 7.1 6.5 - 8.1 g/dL   Albumin 3.2 (L) 3.5 - 5.0 g/dL   AST 21 15 - 41 U/L   ALT 20 0 - 44 U/L   Alkaline Phosphatase 95 38 - 126  U/L   Total Bilirubin 0.9 0.3 - 1.2 mg/dL   GFR, Estimated >60 >60 mL/min   Anion gap 15 5 - 15  Magnesium     Status: None   Collection Time: 07/12/20  3:55 AM  Result Value Ref Range   Magnesium 2.0 1.7 - 2.4 mg/dL  Glucose, capillary     Status: Abnormal   Collection Time: 07/12/20  6:34 AM  Result Value Ref Range   Glucose-Capillary 256 (H) 70 - 99 mg/dL   *Note: Due to a large number of results and/or encounters for the requested time period, some results have not been displayed. A complete set of results can be found in Results Review.    Recent Results (from the past 240 hour(s))  Resp Panel by RT-PCR (Flu A&B, Covid) Nasopharyngeal Swab     Status: None   Collection Time: 07/11/20  7:24 AM   Specimen: Nasopharyngeal Swab; Nasopharyngeal(NP) swabs in vial transport medium  Result Value Ref Range Status   SARS Coronavirus 2 by RT PCR NEGATIVE NEGATIVE Final    Comment: (NOTE) SARS-CoV-2 target nucleic acids are NOT DETECTED.  The SARS-CoV-2 RNA is generally detectable in upper respiratory specimens during the acute phase of infection. The lowest concentration of SARS-CoV-2 viral copies this assay can detect is 138 copies/mL. A negative result does not preclude SARS-Cov-2 infection and should not be used as the sole basis for treatment or other patient  management decisions. A negative result may occur with  improper specimen collection/handling, submission of specimen other than nasopharyngeal swab, presence of viral mutation(s) within the areas targeted by this assay, and inadequate number of viral copies(<138 copies/mL). A negative result must be combined with clinical observations, patient history, and epidemiological information. The expected result is Negative.  Fact Sheet for Patients:  EntrepreneurPulse.com.au  Fact Sheet for Healthcare Providers:  IncredibleEmployment.be  This test is no t yet approved or cleared by the [place] FDA and  has been authorized for detection and/or diagnosis of SARS-CoV-2 by FDA under an Emergency Use Authorization (EUA). This EUA will remain  in effect (meaning this test can be used) for the duration of the COVID-19 declaration under Section 564(b)(1) of the Act, 21 U.S.C.section 360bbb-3(b)(1), unless the authorization is terminated  or revoked sooner.       Influenza A by PCR NEGATIVE NEGATIVE Final   Influenza B by PCR NEGATIVE NEGATIVE Final    Comment: (NOTE) The Xpert Xpress SARS-CoV-2/FLU/RSV plus assay is intended as an aid in the diagnosis of influenza from Nasopharyngeal swab specimens and should not be used as a sole basis for treatment. Nasal washings and aspirates are unacceptable for Xpert Xpress SARS-CoV-2/FLU/RSV testing.  Fact Sheet for Patients: EntrepreneurPulse.com.au  Fact Sheet for Healthcare Providers: IncredibleEmployment.be  This test is not yet approved or cleared by the [place] FDA and has been authorized for detection and/or diagnosis of SARS-CoV-2 by FDA under an Emergency Use Authorization (EUA). This EUA will remain in effect (meaning this test can be used) for the duration of the COVID-19 declaration under Section 564(b)(1) of the Act, 21 U.S.C. section 360bbb-3(b)(1),  unless the authorization is terminated or revoked.  Performed at [facility], [place] [address], [place], [place] [address]      Radiology Studies: DG Chest Portable 1 View  Result Date: 07/11/2020 CLINICAL DATA:  Patient with decreased oxygen saturation. EXAM: PORTABLE CHEST 1 VIEW COMPARISON:  Chest radiograph 12/25/2019. FINDINGS: Monitoring leads overlie the patient. Stable enlarged cardiac and mediastinal  contours status post median sternotomy. Slight interval increase in interstitial opacities bilaterally. No large pleural effusion or pneumothorax. Thoracic spine degenerative changes. IMPRESSION: Mild interval increase in interstitial opacities suggestive of edema. Electronically Signed   By: Lovey Newcomer M.D.   On: 07/11/2020 08:05   ECHOCARDIOGRAM COMPLETE  Result Date: 07/12/2020    ECHOCARDIOGRAM REPORT   Patient Name:   TRINE FREAD Date of Exam: 07/12/2020 Medical Rec #:  [id number]          Height:       61.0 in Accession #:    [id number]         Weight:       229.1 lb Date of Birth:  07/05/1956          BSA:          2.001 m Patient Age:    50 years           BP:           160/74 mmHg Patient Gender: F                  HR:           85 bpm. Exam Location:  Inpatient Procedure: 2D Echo, Cardiac Doppler, Color Doppler and Intracardiac            Opacification Agent Indications:    Abnormal ECG 794.31 / R94.31  History:        Patient has prior history of Echocardiogram examinations, most                 recent 12/25/2019. Previous Myocardial Infarction and CAD, Prior                 CABG, Stroke and COPD, Aortic Valve Disease,                 Signs/Symptoms:Chest Pain; Risk Factors:Hypertension, Diabetes,                 Dyslipidemia and Former Smoker. GERD.  Sonographer:    Vickie Epley RDCS Referring Phys: [id number] RONDELL A SMITH IMPRESSIONS  1. There is no left ventricular thrombus with Definity contrast. 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 Left ventricular ejection fraction, by  estimation, is 40 to 45%. The left ventricle has mildly decreased function. The left ventricle demonstrates regional wall motion abnormalities (see scoring diagram/findings for description). There is moderate concentric left ventricular hypertrophy. Left ventricular diastolic parameters are consistent with Grade II diastolic dysfunction (pseudonormalization). Elevated left atrial pressure. There is moderate hypokinesis of the left ventricular, mid-apical anterior wall and anteroseptal wall. There is mild dyskinesis of the left ventricular, entire apical segment.  2. Right ventricular systolic function is normal. The right ventricular size is normal. Tricuspid regurgitation signal is inadequate for assessing PA pressure.  3. Left atrial size was moderately dilated.  4. The mitral valve is normal in structure. Mild mitral valve regurgitation. Moderate mitral annular calcification.  5. The aortic valve is bicuspid. Aortic valve regurgitation is not visualized. Mild aortic valve sclerosis is present, with no evidence of aortic valve stenosis.  6. The inferior vena cava is normal in size with greater than 50% respiratory variability, suggesting right atrial pressure of 3 mmHg. Comparison(s): Prior images reviewed side by side. Similar left ventricular wall motion abnormalities and EF were probably present on the previous study, but not well identified without the use of Definity contrast. FINDINGS  Left Ventricle: There is no left ventricular thrombus with Definity  contrast. 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 Left ventricular ejection fraction, by estimation, is 40 to 45%. The left ventricle has mildly decreased function. The left ventricle demonstrates regional wall motion abnormalities. Moderate hypokinesis of the left ventricular, mid-apical anterior wall and anteroseptal wall. Mild dyskinesis of the left ventricular, entire apical segment. Definity contrast agent was given IV to delineate the left ventricular  endocardial borders. The left ventricular internal cavity size was normal in size. There is moderate concentric left ventricular hypertrophy. Left ventricular diastolic parameters are consistent with Grade II diastolic dysfunction (pseudonormalization). Elevated left atrial pressure.  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 The mid and distal anterior wall, mid and distal anterior septum, and apical inferior segment are hypokinetic. The entire lateral wall, inferior wall, basal anteroseptal segment, mid inferoseptal segment, basal anterior segment, and basal inferoseptal segment are normal. Right Ventricle: The right ventricular size is normal. No increase in right ventricular wall thickness. Right ventricular systolic function is normal. Tricuspid regurgitation signal is inadequate for assessing PA pressure. Left Atrium: Left atrial size was moderately dilated. Right Atrium: Right atrial size was normal in size. Pericardium: There is no evidence of pericardial effusion. Mitral Valve: The mitral valve is normal in structure. Moderate mitral annular calcification. Mild mitral valve regurgitation. Tricuspid Valve: The tricuspid valve is normal in structure. Tricuspid valve regurgitation is not demonstrated. Aortic Valve: The right and left aortic cusps are fused. The aortic valve is bicuspid. Aortic valve regurgitation is not visualized. Mild aortic valve sclerosis is present, with no evidence of aortic valve stenosis. Aortic valve mean gradient measures 11.0 mmHg. Aortic valve peak gradient measures 22.3 mmHg. Aortic valve area, by VTI measures 2.05 cm. Pulmonic Valve: The pulmonic valve was grossly normal. Pulmonic valve regurgitation is not visualized. Aorta: The aortic root and ascending aorta are structurally normal, with no evidence of dilitation. Venous: The inferior vena cava is normal in size with greater than 50% respiratory variability, suggesting right atrial pressure of 3 mmHg. IAS/Shunts: No atrial  level shunt detected by color flow Doppler.  LEFT VENTRICLE PLAX 2D LVIDd:         3.60 cm      Diastology LVIDs:         2.20 cm      LV e' medial:    2.89 cm/s LV PW:         1.60 cm      LV E/e' medial:  58.8 LV IVS:        1.40 cm      LV e' lateral:   5.22 cm/s LVOT diam:     2.20 cm      LV E/e' lateral: 32.6 LV SV:         91 LV SV Index:   45 LVOT Area:     3.80 cm  LV Volumes (MOD) LV vol d, MOD A2C: 89.2 ml LV vol d, MOD A4C: 117.0 ml LV vol s, MOD A2C: 57.2 ml LV vol s, MOD A4C: 72.9 ml LV SV MOD A2C:     32.0 ml LV SV MOD A4C:     117.0 ml LV SV MOD BP:      40.7 ml RIGHT VENTRICLE RV S prime:     10.10 cm/s LEFT ATRIUM             Index       RIGHT ATRIUM           Index LA diam:  4.70 cm 2.35 cm/m  RA Area:     16.60 cm LA Vol (A2C):   46.0 ml 22.99 ml/m RA Volume:   43.00 ml  21.49 ml/m LA Vol (A4C):   55.8 ml 27.89 ml/m LA Biplane Vol: 51.7 ml 25.84 ml/m  AORTIC VALVE AV Area (Vmax):    1.98 cm AV Area (Vmean):   2.07 cm AV Area (VTI):     2.05 cm AV Vmax:           236.00 cm/s AV Vmean:          149.000 cm/s AV VTI:            0.444 m AV Peak Grad:      22.3 mmHg AV Mean Grad:      11.0 mmHg LVOT Vmax:         123.00 cm/s LVOT Vmean:        81.100 cm/s LVOT VTI:          0.239 m LVOT/AV VTI ratio: 0.54  AORTA Ao Root diam: 3.00 cm MITRAL VALVE MV Area (PHT): 4.68 cm     SHUNTS MV Decel Time: 162 msec     Systemic VTI:  0.24 m MR Peak grad: 88.7 mmHg     Systemic Diam: 2.20 cm MR Vmax:      471.00 cm/s MV E velocity: 170.00 cm/s MV A velocity: 107.00 cm/s MV E/A ratio:  1.59 Mihai Croitoru MD Electronically signed by Sanda Klein MD Signature Date/Time: 07/12/2020/10:02:16 AM    Final    DG Chest Portable 1 View  Final Result      Scheduled Meds: . apixaban  5 mg Oral BID  . arformoterol  15 mcg Nebulization BID  . azaTHIOprine  125 mg Oral Daily  . budesonide  2 mL Nebulization BID  . calcitRIOL  0.25 mcg Oral Q3 days  . cyclobenzaprine  20 mg Oral QHS  . diltiazem   120 mg Oral Daily  . dofetilide  250 mcg Oral BID  . DULoxetine  30 mg Oral Daily  . ferrous sulfate  325 mg Oral Q breakfast  . fluticasone  2 spray Each Nare BID  . furosemide  40 mg Intravenous BID  . influenza vac split quadrivalent PF  0.5 mL Intramuscular Tomorrow-1000  . insulin aspart  0-20 Units Subcutaneous TID WC  . insulin glargine  12 Units Subcutaneous QAC breakfast  . ipratropium-albuterol  3 mL Nebulization BID  . lamoTRIgine  25 mg Oral QHS  . metoprolol tartrate  50 mg Oral BID  . montelukast  10 mg Oral QHS  . oxyCODONE  10 mg Oral TID  . pantoprazole  40 mg Oral Daily  . [START ON 07/13/2020] predniSONE  5 mg Oral Q breakfast  . promethazine  25 mg Oral QHS  . rosuvastatin  20 mg Oral Daily  . sertraline  100 mg Oral Daily  . sodium chloride flush  3 mL Intravenous Q12H   PRN Meds: acetaminophen **OR** acetaminophen, clotrimazole, LORazepam Continuous Infusions:   LOS: 1 day  Time spent: Greater than 50% of the 35 minute visit was spent in counseling/coordination of care for the patient as laid out in the A&P.   Dwyane Dee, MD [facility] 07/12/2020, 1:08 PM

## 2020-07-12 NOTE — Assessment & Plan Note (Signed)
-  No obvious source of infection -Discontinue antibiotics.  Trend procalcitonin -Suspect that her clinical presentation more consistent with volume overload and possible asthma exacerbation.  She is nontoxic-appearing and much improved this morning

## 2020-07-12 NOTE — Hospital Course (Addendum)
Katherine [name] is a 64 y.o. female with medical history significant of HTN, HLD, PAF on Eliquis, CAD s/p CABG, RA, ESRD s/p renal transplant on chronic immunosuppressive therapy(Imuran and prednisone 5mg  daily), persistent asthma, chronic respiratory failure on 3 L, DM type II, and obesity s/p lap band who presented with SOB.   She had not been feeling well for approximately a week prior to admission.  She has also been having difficulty with breathing in her house due to her roommate smoking inside.  She had associated symptoms of a productive cough which is chronic for her and productive typically of brown/green sputum.  She has had worsened swelling in her legs and increased weight. She had been off of Lasix for approximately 1 week due to feeling dehydrated. In the ER she was found to be hypoxic (she is on 3 L chronically at home).  She was started on Lasix and began diuresing well.  CXR was also concerning for some interstitial opacities most likely edema but some concern for pneumonia on admission.  She was started on cefepime and azithromycin.  Her infectious work-up was negative and antibiotics were discontinued. Prior to discharge she did have some noticeable brownish/yellow sputum; she was therefore discharged home with a 5-day course of Augmentin.  She diuresed well and had good improvement with her respiratory symptoms from admission.  She was able to be weaned down to her home setting of 3 L nasal cannula oxygen. She was continued on Lasix at discharge.

## 2020-07-12 NOTE — Progress Notes (Signed)
  Echocardiogram 2D Echocardiogram has been performed.  Michiel Cowboy 07/12/2020, 9:16 AM

## 2020-07-13 LAB — GLUCOSE, CAPILLARY
Glucose-Capillary: 191 mg/dL — ABNORMAL HIGH (ref 70–99)
Glucose-Capillary: 260 mg/dL — ABNORMAL HIGH (ref 70–99)
Glucose-Capillary: 282 mg/dL — ABNORMAL HIGH (ref 70–99)
Glucose-Capillary: 202 mg/dL — ABNORMAL HIGH (ref 70–99)

## 2020-07-13 LAB — BASIC METABOLIC PANEL
Anion gap: 13 (ref 5–15)
BUN: 20 mg/dL (ref 8–23)
CO2: 28 mmol/L (ref 22–32)
Calcium: 9 mg/dL (ref 8.9–10.3)
Chloride: 96 mmol/L — ABNORMAL LOW (ref 98–111)
Creatinine, Ser: 1.02 mg/dL — ABNORMAL HIGH (ref 0.44–1.00)
GFR, Estimated: >60 mL/min (ref >60)
Glucose, Bld: 235 mg/dL — ABNORMAL HIGH (ref 70–99)
Potassium: 3.7 mmol/L (ref 3.5–5.1)
Sodium: 137 mmol/L (ref 135–145)

## 2020-07-13 LAB — CBC WITH DIFFERENTIAL/PLATELET
Abs Immature Granulocytes: 0.05 10*3/uL (ref 0.00–0.07)
Basophils Absolute: 0 10*3/uL (ref 0.0–0.1)
Basophils Relative: 0 %
Eosinophils Absolute: 0.1 10*3/uL (ref 0.0–0.5)
Eosinophils Relative: 1 %
HCT: 31.5 % — ABNORMAL LOW (ref 36.0–46.0)
Hemoglobin: 10 g/dL — ABNORMAL LOW (ref 12.0–15.0)
Immature Granulocytes: 1 %
Lymphocytes Relative: 11 %
Lymphs Abs: 1.1 10*3/uL (ref 0.7–4.0)
MCH: 30.5 pg (ref 26.0–34.0)
MCHC: 31.7 g/dL (ref 30.0–36.0)
MCV: 96 fL (ref 80.0–100.0)
Monocytes Absolute: 0.8 10*3/uL (ref 0.1–1.0)
Monocytes Relative: 8 %
Neutro Abs: 8.2 10*3/uL — ABNORMAL HIGH (ref 1.7–7.7)
Neutrophils Relative %: 79 %
Platelets: 237 10*3/uL (ref 150–400)
RBC: 3.28 MIL/uL — ABNORMAL LOW (ref 3.87–5.11)
RDW: 15.7 % — ABNORMAL HIGH (ref 11.5–15.5)
WBC: 10.3 10*3/uL (ref 4.0–10.5)
nRBC: 0 % (ref 0.0–0.2)

## 2020-07-13 LAB — MAGNESIUM: Magnesium: 1.9 mg/dL (ref 1.7–2.4)

## 2020-07-13 MED ORDER — POTASSIUM CHLORIDE CRYS ER 20 MEQ PO TBCR
40.0000 meq | EXTENDED_RELEASE_TABLET | Freq: Once | ORAL | Status: AC
  Administered 2020-07-13: 40 meq via ORAL
  Filled 2020-07-13: qty 2

## 2020-07-13 NOTE — ED Provider Notes (Signed)
Katherine [name]   CSN: [id number] Arrival date & time: 07/11/20  0710     History Chief Complaint  Patient presents with  . Shortness of Breath    Katherine [name] is a 64 y.o. female.  HPI     64 y.o. female with medical history significant of HTN, HLD, PAF on Eliquis, CAD s/p CABG, RA, ESRD s/p renal transplant on chronic immunosuppressive therapy(Imuran and prednisone), persistent asthma, chronic respiratory failure on 3 L, DM type II, and obesity s/p lap band presents with complaints of shortness of breath.  Patient reports that she has stopped taking Lasix the last week or so.  Patient woke up suddenly feeling short of breath.  Her shortness of breath started at started yesterday.  Per EMS her O2 sats were 75% and she was started on BiPAP.  Past Medical History:  Diagnosis Date  . Anemia   . Anxiety   . Bilateral carotid artery stenosis    Carotid duplex 10/4740: 5-95% LICA, 63-87% RICA, >56% RECA, f/u 1 yr suggested  . CAD (coronary artery disease) of bypass graft 5/01; 3/'02, 8/'03, 10/'04; 1/15   PCI x 5 to SVG-D1   . CAD in native artery 07/1993   3 Vessel Disease (LAD-D1 & RCA) -- CABG (Dx in setting of inferior STEMI-PTCA of RCA)  . CAD S/P percutaneous coronary angioplasty    PCI to SVG-D1 insertion/native D1 x 4 = '01 -(S660 BMS 2.5 x 9 anastomosis- D1); '02 - distal overlap ACS Pixel 2.5 x 8  BMS; '03 distal/native ISR/Thrombosis - Pixel 2.5 x 13; '04 - ISR-  Taxus 2.5 x 20 (covered all);; 1/15 - mid SVG-D1 (50% distal ISR) - Promus P 2.75 x 20 -- 2.8 mm  . COPD mixed type ([place])    Followed by Dr. Lamonte Sakai "pulmonologist said no COPD"  . Depression   . Depression with anxiety   . Diabetes mellitus type 2 in obese ([place])   . Diarrhea    started after cholecystectomy and mass removed from intestine  . Dyslipidemia, goal LDL below 70    08/2012: TC 137, TG 200, HDL 32!, LDL 45; on statin (followed by Dr.Deterding)  . ESRD (end stage  renal disease) ([place]) 1991   s/p Cadaveric Renal Transplant [facility] - Dr. Jimmy Footman)   . Family history of adverse reaction to anesthesia    mom's bp dropped during/after anesthesia  . Fibromyalgia   . GERD (gastroesophageal reflux disease)   . Glomerulonephritis, chronic, rapidly progressive 52  . 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 07/1993   Rescue PTCA of RCA -- referred for CABG.  . H/O: GI bleed   . Headache    migraines in the past  . History of CABG x 3 08/1993   Dr. Servando Snare: LIMA-LAD, SVG-bifurcatingD1, SVG-rPDA  . History of kidney stones   . History of stroke 2012   "right eye stroke- half blind now"  . Hypertension associated with diabetes ([place])   . Mild aortic stenosis by prior echocardiogram 07/2019   Echo:  Mild aortic stenosis (gradients: Mean 14.3 mmHg -peak 24.9 mmHg).  . Morbid obesity ([place])   . MRSA (methicillin resistant staph aureus) culture positive   . OSA (obstructive sleep apnea)    no longer on CPAP or home O2, states she doesn't need now after lap band  . PAD (peripheral artery disease) ([place]) 08/2013   LEA Dopplers to be read by Dr. Fletcher Anon  . PAF (  paroxysmal atrial fibrillation) ([place]) 06/2014   Noted on CardioNet Monitor  - --> rhythm control with Tikosyn (Dr. Rayann Heman); converted from warfarin to apixaban for anticoagulation.  . Pneumonia   . Recurrent boils    Bilateral Groin  . Rheumatoid arthritis ([place])    Per Patient Report; associated with OA  . S/p cadaver renal transplant [place]  . Unstable angina ([place]) 5/01; 3/'02, 8/'03, 10/'04; 1/15   x 5 occurences since Inf-Post STEMI in 1994    Patient Active Problem List   Diagnosis Date Noted  . Acute on chronic respiratory failure with hypoxia ([place]) 07/11/2020  . SIRS (systemic inflammatory response syndrome) ([place]) 07/11/2020  . Acute on chronic diastolic heart failure ([place]) 12/26/2019  . Acute exacerbation of CHF (congestive heart failure) ([place]) 12/25/2019  .  Chronic bronchitis, mucopurulent ([place]) 08/24/2019  . HCAP (healthcare-associated pneumonia) 08/08/2019  . Asthma exacerbation 07/23/2019  . Current chronic use of systemic steroids 07/23/2019  . OSA (obstructive sleep apnea)   . Severe episode of recurrent major depressive disorder, with psychotic features ([place]) 02/16/2019  . Obstructive chronic bronchitis without exacerbation ([place]) 11/03/2018  . Long term (current) use of anticoagulants 04/28/2018  . Renal transplant, status post 01/28/2018  . Primary osteoarthritis of right knee 08/18/2017  . Mild aortic stenosis by prior echocardiogram 01/28/2017  . 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 Normocytic anemia 10/13/2016  . Fibromyalgia 03/30/2016  . Other spondylosis with radiculopathy, lumbar region 03/30/2016  . Type 2 diabetes mellitus with peripheral neuropathy ([place]) 03/30/2016  . Paroxysmal atrial fibrillation ([place]); CHA2DS2VASc score F, HTN, CAD, CVA = 5 06/28/2014  . Hypertension associated with diabetes ([place])   . PAD (peripheral artery disease) ([place]) 08/17/2013  . Stenosis of right carotid artery without infarction 10/08/2012  . Dyslipidemia, goal LDL below 70 10/08/2012    Class: Diagnosis of  . Mitral annular calcification 10/08/2012  . Hypomagnesemia 06/13/2012  . Healthcare-associated pneumonia 06/12/2012  . Renal transplant disorder 06/12/2012  . Chronic allergic rhinitis 04/29/2011  . Extrinsic asthma 09/09/2007  . GERD 09/09/2007  . COUGH, CHRONIC 09/09/2007  . Morbid obesity - s/p Lap Band 9/'05 05/07/2004    Class: Diagnosis of  . S/P CABG (coronary artery bypass graft) x 3 09/07/1993    Class: History of  . 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 07/1993  . Coronary artery disease involving native coronary artery of native heart with angina pectoris ([place]) 07/1993  . ESRD (end stage renal disease) ([place]) 1991    Past Surgical History:  Procedure Laterality Date  . ABDOMINAL AORTOGRAM N/A  04/21/2018   Procedure: ABDOMINAL AORTOGRAM;  Surgeon: [name], MD;  Location: [facility];  Service: Cardiovascular;  Laterality: N/A;  . CATHETER REMOVAL    . CHOLECYSTECTOMY N/A 10/29/2014   Procedure: LAPAROSCOPIC CHOLECYSTECTOMY WITH INTRAOPERATIVE CHOLANGIOGRAM;  Surgeon: Excell Seltzer, MD;  Location: [facility];  Service: General;  Laterality: N/A;  . CORONARY ANGIOPLASTY  1994   x5  . CORONARY ARTERY BYPASS GRAFT  1995   LIMA-LAD, SVG-RPDA, SVG-D1  . ESOPHAGOGASTRODUODENOSCOPY N/A 10/15/2016   Procedure: ESOPHAGOGASTRODUODENOSCOPY (EGD);  Surgeon: Wilford Corner, MD;  Location: [facility];  Service: Endoscopy;  Laterality: N/A;  . I & D EXTREMITY Right 01/29/2018   Procedure: IRRIGATION AND DEBRIDEMENT THUMB;  Surgeon: Dayna Barker, MD;  Location: [facility];  Service: Plastics;  Laterality: Right;  . INCISE AND DRAIN ABCESS    . KIDNEY TRANSPLANT  1991  . KNEE ARTHROSCOPY WITH LATERAL  MENISECTOMY Left 12/03/2017   Procedure: LEFT KNEE ARTHROSCOPY WITH LATERAL MENISECTOMY;  Surgeon: Earlie Server, MD;  Location: [facility];  Service: Orthopedics;  Laterality: Left;  . LAPAROSCOPIC GASTRIC BANDING  04/2004; 10/'09, 2/'10   Port Replacement x 2  . LEFT HEART CATH AND CORS/GRAFTS ANGIOGRAPHY N/A 04/21/2018   Procedure: LEFT HEART CATH AND CORS/GRAFTS ANGIOGRAPHY;  Surgeon: [name], MD;  Location: [facility];  Ost-Prox LAD 50% - proxLAD (pre & post D1) 100% CTO. Cx - patent, small OM1 (stable ~ ostial OM1 90%, too small for PCI) & 2 small LPL; Ost-distal RCA 100% CTO.  LIMA-LAD (not injected); SVG-dRCA patent, SVG-D1 - insertion stent ~20% ISR - Severe R CFA disease w/ focal Sub TO  . LEFT HEART CATH AND CORS/GRAFTS ANGIOGRAPHY  5/'01, 3/'02, 8/'03, 10/'04; 1/'15   08/22/2013: LAD & RCA 100%; LIMA-LAD & SVG-rPDA patent; Cx-- OM1 60%, OM2 ostial ~50%; SVG-D1 - 80% mid, 50% distal ISR --PCI  . LEFT HEART CATHETERIZATION WITH CORONARY/GRAFT ANGIOGRAM N/A 08/23/2013    Procedure: LEFT HEART CATHETERIZATION WITH Beatrix Fetters;  Surgeon: Wellington Hampshire, MD;  Location: [facility] CATH LAB;  Service: Cardiovascular;  Laterality: N/A;  . Lower Extremity Arterial Dopplers  08/2013   ABI: R 0.96, L 1.04  . MULTIPLE TOOTH EXTRACTIONS  age 77  . NM MYOVIEW LTD  03/2016   EF 62%. LOW RISK. C/W prior MI - no Ischemia. Apical hypokinesis.  [name] Kitchen PERCUTANEOUS CORONARY STENT INTERVENTION (PCI-S)  5/'01, 3/'02, 8/'03, 10/'04;   '01 - S660 BMS 2.5 x 9 - dSVG-D1 into D1; '02- post-stent stenosis - 2.5 x 8 Pixel BMS; '8\03: ISR/Thrombosis into native D1 - AngioJet, 2.5 x 13 Pixel; '04 - ISR 95% - covered stented area with Taxus DES 2.5 mm x 20 (2.88)  . PERCUTANEOUS CORONARY STENT INTERVENTION (PCI-S)  08/23/2013   Procedure: PERCUTANEOUS CORONARY STENT INTERVENTION (PCI-S);  Surgeon: Wellington Hampshire, MD;  Location: [facility] CATH LAB;  Service: Cardiovascular;;mid SVG-D1 80%; distal stent ~50% ISR; Promus [place] DES 2.75 mm xc 20 mm (2.8 mm)  . PORT-A-CATH REMOVAL     kidney  . TRANSTHORACIC ECHOCARDIOGRAM  07/2019   EF 55 to 60%.  No LVH.  Paradoxical septal wall motion-post CABG.  GRII DD.  Normal RV size and function.  Mild bilateral atrial dilation.  Moderate MAC.  Trace MR.  Mild aortic stenosis (gradients: Mean 14.3 mmHg -peak 24.9 mmHg).  . TUBAL LIGATION    . wrist fistula repair Left    dialysis for one year     OB History   No obstetric history on file.     Family History  Problem Relation Age of Onset  . Cancer Mother        liver  . Heart disease Father   . Cancer Father        colon  . Arrhythmia Brother        Atrial Fibrillation  . Arrhythmia Paternal Aunt        Atrial Fibrillation    Social History   Tobacco Use  . Smoking status: Former Smoker    Packs/day: 1.00    Years: 30.00    Pack years: 30.00    Types: Cigarettes    Quit date: 08/17/2002    Years since quitting: 17.9  . Smokeless tobacco: Never Used  Vaping Use  . Vaping Use: Never  used  Substance Use Topics  . Alcohol use: No  . Drug use: No  Home Medications Prior to Admission medications   Medication Sig Start Date End Date Taking? Authorizing Provider  acetaminophen (TYLENOL) 500 MG tablet Take 1,500 mg by mouth daily.   Yes [provider]  albuterol (PROVENTIL) (2.5 MG/3ML) 0.083% nebulizer solution Take 3 mLs (2.5 mg total) by nebulization every 6 (six) hours as needed for wheezing or shortness of breath (dx: J45.20). 03/15/19  Yes Parrett, Tammy S, NP  albuterol (VENTOLIN HFA) 108 (90 Base) MCG/ACT inhaler TAKE 2 PUFFS BY MOUTH EVERY 6 HOURS AS NEEDED FOR WHEEZE OR SHORTNESS OF BREATH Patient taking differently: Inhale 2 puffs into the lungs every 6 (six) hours as needed for wheezing or shortness of breath.  01/22/20  Yes Collene Gobble, MD  arformoterol (BROVANA) 15 MCG/2ML NEBU Take 2 mLs (15 mcg total) by nebulization 2 (two) times daily. Patient taking differently: Take 15 mcg by nebulization 2 (two) times daily. Mix with budesonide 08/30/19  Yes Byrum, Rose Fillers, MD  azaTHIOprine (IMURAN) 50 MG tablet Take 125 mg by mouth daily.    Yes [provider]  budesonide (PULMICORT) 0.5 MG/2ML nebulizer solution Take 2 mLs (0.5 mg total) by nebulization 2 (two) times daily. Patient taking differently: Take 2 mLs by nebulization 2 (two) times daily. Mix with Brovana 08/24/19  Yes [name], MD  calcitRIOL (ROCALTROL) 0.25 MCG capsule Take 0.25 mcg by mouth every 3 (three) days.    Yes [provider]  clotrimazole (MYCELEX) 10 MG troche Take 1 tablet (10 mg total) by mouth 5 (five) times daily. Patient taking differently: Take 10 mg by mouth 5 (five) times daily as needed (thrush).  11/28/18  Yes Parrett, Tammy S, NP  cyclobenzaprine (FLEXERIL) 10 MG tablet TAKE 1 TABLET BY MOUTH 3 TIMES DAILY AS NEEDED FOR MUSCLE SPASMS Patient taking differently: Take 10-20 mg by mouth See admin instructions. Take two tablets (20 mg) by mouth daily at  bedtime, may also take one tablet (10 mg) at noon as needed for muscle spasms 10/19/17  Yes Meredith Staggers, MD  diltiazem (CARDIZEM CD) 120 MG 24 hr capsule TAKE 1 CAPSULE BY MOUTH EVERY DAY Patient taking differently: Take 120 mg by mouth daily.  12/07/19  Yes [name], MD  diltiazem (CARDIZEM) 30 MG tablet TAKE 1 TABLET EVERY 4 HOURS AS NEEDED FOR AFIB HEART RATE >100 Patient taking differently: Take 30 mg by mouth 2 (two) times daily.  06/03/20  Yes Sherran Needs, NP  dofetilide (TIKOSYN) 250 MCG capsule TAKE 1 CAPSULE BY MOUTH TWICE A DAY Patient taking differently: Take 250 mcg by mouth 2 (two) times daily. TAKE 1 CAPSULE BY MOUTH TWICE A DAY 10/27/19  Yes Sherran Needs, NP  DULoxetine (CYMBALTA) 30 MG capsule Take 30 mg by mouth daily.   Yes [provider]  ELIQUIS 5 MG TABS tablet TAKE 1 TABLET BY MOUTH TWICE A DAY (TO REPLACE WARFARIN) Patient taking differently: Take 5 mg by mouth 2 (two) times daily.  03/29/20  Yes [name], MD  ferrous sulfate 325 (65 FE) MG tablet Take 1 tablet (325 mg total) by mouth daily with breakfast. 08/14/19  Yes Hall, Carole N, DO  fluticasone (FLONASE) 50 MCG/ACT nasal spray PLACE 2 SPRAYS INTO BOTH NOSTRILS 2 TIMES DAILY Patient taking differently: Place 2 sprays into both nostrils 2 (two) times daily.  03/05/20  Yes Collene Gobble, MD  furosemide (LASIX) 40 MG tablet Take 40 mg by mouth daily.    Yes [provider]  glimepiride (AMARYL) 4 MG tablet Take 4 mg by mouth daily. 02/03/20  Yes [provider]  insulin aspart (NOVOLOG FLEXPEN) 100 UNIT/ML FlexPen Inject 10 Units into the skin 2 (two) times daily after a meal.   Yes [provider]  Insulin Glargine (BASAGLAR KWIKPEN) 100 UNIT/ML SOPN Inject 12 Units into the skin daily before breakfast.    Yes [provider]  lamoTRIgine (LAMICTAL) 25 MG tablet TAKE 1 TABLET BY MOUTH EVERYDAY AT BEDTIME Patient taking differently: Take 25 mg by mouth  at bedtime.  06/03/20  Yes Meredith Staggers, MD  LORazepam (ATIVAN) 1 MG tablet Take 1 tablet (1 mg total) by mouth every 8 (eight) hours as needed for anxiety. Takes daily Patient taking differently: Take 1 mg by mouth 2 (two) times daily.  02/27/20  Yes [name], MD  metFORMIN (GLUCOPHAGE) 500 MG tablet Take 500 mg by mouth 2 (two) times daily. 10/03/16  Yes [provider]  metoprolol tartrate (LOPRESSOR) 50 MG tablet TAKE 1 TABLET BY MOUTH TWICE A DAY Patient taking differently: Take 50 mg by mouth See admin instructions. Take one tablet (50 mg) by mouth twice daily, may also take one tablet (50 mg) midday as needed for SBP >150 05/09/20  Yes Sherran Needs, NP  montelukast (SINGULAIR) 10 MG tablet TAKE 1 TABLET BY MOUTH EVERY DAY Patient taking differently: Take 10 mg by mouth at bedtime.  05/30/20  Yes Collene Gobble, MD  nitroGLYCERIN (NITROSTAT) 0.4 MG SL tablet Place 1 tablet (0.4 mg total) under the tongue every 5 (five) minutes as needed for chest pain. 09/11/13  Yes [name], MD  omeprazole (PRILOSEC) 20 MG capsule Take 20 mg by mouth daily.   Yes [provider]  Oxycodone HCl 10 MG TABS Take 1 tablet (10 mg total) by mouth every 8 (eight) hours as needed. Patient taking differently: Take 10 mg by mouth 3 (three) times daily.  06/21/20  Yes Meredith Staggers, MD  OXYGEN Inhale 3 L into the lungs continuous.   Yes [provider]  predniSONE (DELTASONE) 5 MG tablet Take 5 mg by mouth daily. 06/19/19  Yes [provider]  promethazine (PHENERGAN) 25 MG tablet Take 25 mg by mouth at bedtime. For nausea and sleep 07/14/19  Yes [provider]  rosuvastatin (CRESTOR) 20 MG tablet Take 1 tablet (20 mg total) by mouth daily. 08/29/19  Yes [name], MD  sertraline (ZOLOFT) 100 MG tablet Take 100 mg by mouth daily. 06/12/20  Yes [provider]  B-D ULTRAFINE III SHORT PEN 31G X 8 MM MISC See admin instructions. 11/06/19    [provider]  Blood Glucose Monitoring Suppl (ONE TOUCH ULTRA 2) w/Device KIT 2 (two) times daily. 04/08/20   [provider]  Lancets [facility] DELICA PLUS [id number]) [place] daily. as directed 03/26/20   [provider]  magnesium oxide (MAG-OX) 400 MG tablet Take 1 tablet (400 mg total) by mouth daily. Patient not taking: Reported on 07/11/2020 04/18/20   Sherran Needs, NP  [facility] ULTRA test strip USE AS DIRECTED 3 TIMES DAILY TO CHECK BLOOD SUGAR 11/04/19   [provider]    Allergies    Tetracycline, Niacin, Niaspan [niacin er], Sulfa antibiotics, Sulfonamide derivatives, Codeine, Erythromycin, Hydromorphone hcl, Morphine and related, Nalbuphine, Sulfasalazine, and Tape  Review of Systems   Review of Systems  Unable to perform ROS: Acuity of condition  Constitutional: Positive for activity change.  Respiratory:  Positive for cough and shortness of breath.   Cardiovascular: Negative for chest pain.  Allergic/Immunologic: Positive for immunocompromised state.    Physical Exam Updated Vital Signs BP (!) 147/65 (BP Location: Right Arm)   Pulse 66   Temp 97.7 F (36.5 C) (Axillary)   Resp 13   Ht _0  (1.549 m)   Wt 103.9 kg   SpO2 100%   BMI 43.29 kg/m   Physical Exam Vitals and nursing [name] reviewed.  Constitutional:      Appearance: She is well-developed.  HENT:     Head: Normocephalic and atraumatic.  Cardiovascular:     Rate and Rhythm: Normal rate.  Pulmonary:     Effort: Pulmonary effort is normal.     Breath sounds: Examination of the right-lower field reveals rales. Examination of the left-lower field reveals rales. Wheezing and rales present.  Abdominal:     General: Bowel sounds are normal.  Musculoskeletal:     Cervical back: Normal range of motion and neck supple.     Right lower leg: Edema present.     Left lower leg: Edema present.  Skin:    General: Skin is warm and dry.  Neurological:     Mental Status: She is  alert and oriented to person, place, and time.     ED Results / Procedures / Treatments   Labs (all labs ordered are listed, but only abnormal results are displayed) Labs Reviewed  BASIC METABOLIC PANEL - Abnormal; Notable for the following components:      Result Value   Glucose, Bld 221 (*)    All other components within normal limits  MAGNESIUM - Abnormal; Notable for the following components:   Magnesium 1.6 (*)    All other components within normal limits  BRAIN NATRIURETIC PEPTIDE - Abnormal; Notable for the following components:   B Natriuretic Peptide 234.9 (*)    All other components within normal limits  CBC - Abnormal; Notable for the following components:   RBC 3.51 (*)    Hemoglobin 10.5 (*)    HCT 34.9 (*)    RDW 15.6 (*)    All other components within normal limits  LACTIC ACID, PLASMA - Abnormal; Notable for the following components:   Lactic Acid, Venous 3.1 (*)    All other components within normal limits  LACTIC ACID, PLASMA - Abnormal; Notable for the following components:   Lactic Acid, Venous 2.1 (*)    All other components within normal limits  HEMOGLOBIN A1C - Abnormal; Notable for the following components:   Hgb A1c MFr Bld 7.7 (*)    All other components within normal limits  GLUCOSE, CAPILLARY - Abnormal; Notable for the following components:   Glucose-Capillary 290 (*)    All other components within normal limits  CBC - Abnormal; Notable for the following components:   RBC 3.54 (*)    Hemoglobin 10.7 (*)    HCT 33.4 (*)    All other components within normal limits  COMPREHENSIVE METABOLIC PANEL - Abnormal; Notable for the following components:   Glucose, Bld 236 (*)    Albumin 3.2 (*)    All other components within normal limits  GLUCOSE, CAPILLARY - Abnormal; Notable for the following components:   Glucose-Capillary 290 (*)    All other components within normal limits  GLUCOSE, CAPILLARY - Abnormal; Notable for the following components:    Glucose-Capillary 256 (*)    All other components within normal limits  GLUCOSE, CAPILLARY - Abnormal; Notable for  the following components:   Glucose-Capillary 302 (*)    All other components within normal limits  GLUCOSE, CAPILLARY - Abnormal; Notable for the following components:   Glucose-Capillary 215 (*)    All other components within normal limits  BASIC METABOLIC PANEL - Abnormal; Notable for the following components:   Chloride 96 (*)    Glucose, Bld 235 (*)    Creatinine, Ser 1.02 (*)    All other components within normal limits  CBC WITH DIFFERENTIAL/PLATELET - Abnormal; Notable for the following components:   RBC 3.28 (*)    Hemoglobin 10.0 (*)    HCT 31.5 (*)    RDW 15.7 (*)    Neutro Abs 8.2 (*)    All other components within normal limits  GLUCOSE, CAPILLARY - Abnormal; Notable for the following components:   Glucose-Capillary 191 (*)    All other components within normal limits  TROPONIN I (HIGH SENSITIVITY) - Abnormal; Notable for the following components:   Troponin I (High Sensitivity) 35 (*)    All other components within normal limits  RESP PANEL BY RT-PCR (FLU A&B, COVID) ARPGX2  CULTURE, BLOOD (SINGLE)  CULTURE, BLOOD (ROUTINE X 2) W REFLEX TO ID PANEL  CULTURE, BLOOD (SINGLE)  URINE CULTURE  PROTIME-INR  APTT  PROCALCITONIN  MAGNESIUM  MAGNESIUM  URINALYSIS, ROUTINE W REFLEX MICROSCOPIC    EKG EKG Interpretation  Date/Time:  Thursday July 11 2020 07:16:24 EST Ventricular Rate:  93 PR Interval:    QRS Duration: 156 QT Interval:  404 QTC Calculation: 503 R Axis:   -27 Text Interpretation: Sinus rhythm Consider left atrial enlargement Left bundle branch block  - new Nonspecific ST and T wave abnormality Confirmed by Varney Biles [phone]) on 07/11/2020 7:20:56 AM Also confirmed by Varney Biles [phone]), editor Gean Quint [phone])  on 07/12/2020 7:30:30 AM   Radiology ECHOCARDIOGRAM COMPLETE  Result Date: 07/12/2020     ECHOCARDIOGRAM REPORT   Patient Name:   Katherine [name] Date of Exam: 07/12/2020 Medical Rec #:  [id number]          Height:       61.0 in Accession #:    [id number]         Weight:       229.1 lb Date of Birth:  1956-06-09          BSA:          2.001 m Patient Age:    47 years           BP:           160/74 mmHg Patient Gender: F                  HR:           85 bpm. Exam Location:  Inpatient Procedure: 2D Echo, Cardiac Doppler, Color Doppler and Intracardiac            Opacification Agent Indications:    Abnormal ECG 794.31 / R94.31  History:        Patient has prior history of Echocardiogram examinations, most                 recent 12/25/2019. Previous Myocardial Infarction and CAD, Prior                 CABG, Stroke and COPD, Aortic Valve Disease,                 Signs/Symptoms:Chest Pain; Risk Factors:Hypertension, Diabetes,  Dyslipidemia and Former Smoker. GERD.  Sonographer:    Vickie Epley RDCS Referring Phys: [id number] RONDELL A SMITH IMPRESSIONS  1. There is no left ventricular thrombus with Definity contrast. 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 Left ventricular ejection fraction, by estimation, is 40 to 45%. The left ventricle has mildly decreased function. The left ventricle demonstrates regional wall motion abnormalities (see scoring diagram/findings for description). There is moderate concentric left ventricular hypertrophy. Left ventricular diastolic parameters are consistent with Grade II diastolic dysfunction (pseudonormalization). Elevated left atrial pressure. There is moderate hypokinesis of the left ventricular, mid-apical anterior wall and anteroseptal wall. There is mild dyskinesis of the left ventricular, entire apical segment.  2. Right ventricular systolic function is normal. The right ventricular size is normal. Tricuspid regurgitation signal is inadequate for assessing PA pressure.  3. Left atrial size was moderately dilated.  4. The mitral valve is normal in structure. Mild  mitral valve regurgitation. Moderate mitral annular calcification.  5. The aortic valve is bicuspid. Aortic valve regurgitation is not visualized. Mild aortic valve sclerosis is present, with no evidence of aortic valve stenosis.  6. The inferior vena cava is normal in size with greater than 50% respiratory variability, suggesting right atrial pressure of 3 mmHg. Comparison(s): Prior images reviewed side by side. Similar left ventricular wall motion abnormalities and EF were probably present on the previous study, but not well identified without the use of Definity contrast. FINDINGS  Left Ventricle: There is no left ventricular thrombus with Definity contrast. 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 Left ventricular ejection fraction, by estimation, is 40 to 45%. The left ventricle has mildly decreased function. The left ventricle demonstrates regional wall motion abnormalities. Moderate hypokinesis of the left ventricular, mid-apical anterior wall and anteroseptal wall. Mild dyskinesis of the left ventricular, entire apical segment. Definity contrast agent was given IV to delineate the left ventricular endocardial borders. The left ventricular internal cavity size was normal in size. There is moderate concentric left ventricular hypertrophy. Left ventricular diastolic parameters are consistent with Grade II diastolic dysfunction (pseudonormalization). Elevated left atrial pressure.  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 The mid and distal anterior wall, mid and distal anterior septum, and apical inferior segment are hypokinetic. The entire lateral wall, inferior wall, basal anteroseptal segment, mid inferoseptal segment, basal anterior segment, and basal inferoseptal segment are normal. Right Ventricle: The right ventricular size is normal. No increase in right ventricular wall thickness. Right ventricular systolic function is normal. Tricuspid regurgitation signal is inadequate for assessing PA pressure. Left  Atrium: Left atrial size was moderately dilated. Right Atrium: Right atrial size was normal in size. Pericardium: There is no evidence of pericardial effusion. Mitral Valve: The mitral valve is normal in structure. Moderate mitral annular calcification. Mild mitral valve regurgitation. Tricuspid Valve: The tricuspid valve is normal in structure. Tricuspid valve regurgitation is not demonstrated. Aortic Valve: The right and left aortic cusps are fused. The aortic valve is bicuspid. Aortic valve regurgitation is not visualized. Mild aortic valve sclerosis is present, with no evidence of aortic valve stenosis. Aortic valve mean gradient measures 11.0 mmHg. Aortic valve peak gradient measures 22.3 mmHg. Aortic valve area, by VTI measures 2.05 cm. Pulmonic Valve: The pulmonic valve was grossly normal. Pulmonic valve regurgitation is not visualized. Aorta: The aortic root and ascending aorta are structurally normal, with no evidence of dilitation. Venous: The inferior vena cava is normal in size with greater than 50% respiratory variability, suggesting right atrial pressure of 3 mmHg. IAS/Shunts: No atrial  level shunt detected by color flow Doppler.  LEFT VENTRICLE PLAX 2D LVIDd:         3.60 cm      Diastology LVIDs:         2.20 cm      LV e' medial:    2.89 cm/s LV PW:         1.60 cm      LV E/e' medial:  58.8 LV IVS:        1.40 cm      LV e' lateral:   5.22 cm/s LVOT diam:     2.20 cm      LV E/e' lateral: 32.6 LV SV:         91 LV SV Index:   45 LVOT Area:     3.80 cm  LV Volumes (MOD) LV vol d, MOD A2C: 89.2 ml LV vol d, MOD A4C: 117.0 ml LV vol s, MOD A2C: 57.2 ml LV vol s, MOD A4C: 72.9 ml LV SV MOD A2C:     32.0 ml LV SV MOD A4C:     117.0 ml LV SV MOD BP:      40.7 ml RIGHT VENTRICLE RV S prime:     10.10 cm/s LEFT ATRIUM             Index       RIGHT ATRIUM           Index LA diam:        4.70 cm 2.35 cm/m  RA Area:     16.60 cm LA Vol (A2C):   46.0 ml 22.99 ml/m RA Volume:   43.00 ml  21.49 ml/m LA Vol  (A4C):   55.8 ml 27.89 ml/m LA Biplane Vol: 51.7 ml 25.84 ml/m  AORTIC VALVE AV Area (Vmax):    1.98 cm AV Area (Vmean):   2.07 cm AV Area (VTI):     2.05 cm AV Vmax:           236.00 cm/s AV Vmean:          149.000 cm/s AV VTI:            0.444 m AV Peak Grad:      22.3 mmHg AV Mean Grad:      11.0 mmHg LVOT Vmax:         123.00 cm/s LVOT Vmean:        81.100 cm/s LVOT VTI:          0.239 m LVOT/AV VTI ratio: 0.54  AORTA Ao Root diam: 3.00 cm MITRAL VALVE MV Area (PHT): 4.68 cm     SHUNTS MV Decel Time: 162 msec     Systemic VTI:  0.24 m MR Peak grad: 88.7 mmHg     Systemic Diam: 2.20 cm MR Vmax:      471.00 cm/s MV E velocity: 170.00 cm/s MV A velocity: 107.00 cm/s MV E/A ratio:  1.59 Mihai Croitoru MD Electronically signed by Sanda Klein MD Signature Date/Time: 07/12/2020/10:02:16 AM    Final     Procedures .Critical Care Performed by: Varney Biles, MD Authorized by: Varney Biles, MD   Critical care provider statement:    Critical care time (minutes):  52   Critical care was necessary to treat or prevent imminent or life-threatening deterioration of the following conditions:  Respiratory failure and cardiac failure   Critical care was time spent personally by me on the following activities:  Discussions with consultants, evaluation of patient's response to  treatment, examination of patient, ordering and performing treatments and interventions, ordering and review of laboratory studies, ordering and review of radiographic studies, pulse oximetry, re-evaluation of patient's condition, obtaining history from patient or surrogate and review of old charts   (including critical care time)  Medications Ordered in ED Medications  sodium chloride flush (NS) 0.9 % injection 3 mL (3 mLs Intravenous Given 07/12/20 2203)  acetaminophen (TYLENOL) tablet 650 mg (650 mg Oral Given 07/12/20 1355)    Or  acetaminophen (TYLENOL) suppository 650 mg ( Rectal See Alternative 07/12/20 1355)    oxyCODONE (Oxy IR/ROXICODONE) immediate release tablet 10 mg (10 mg Oral Given 07/12/20 2201)  diltiazem (CARDIZEM CD) 24 hr capsule 120 mg (120 mg Oral Given 07/12/20 1006)  dofetilide (TIKOSYN) capsule 250 mcg (250 mcg Oral Given 07/12/20 2201)  metoprolol tartrate (LOPRESSOR) tablet 50 mg (50 mg Oral Given 07/12/20 2202)  rosuvastatin (CRESTOR) tablet 20 mg (20 mg Oral Given 07/12/20 1007)  DULoxetine (CYMBALTA) DR capsule 30 mg (30 mg Oral Given 07/12/20 1007)  sertraline (ZOLOFT) tablet 100 mg (100 mg Oral Given 07/12/20 1006)  LORazepam (ATIVAN) tablet 1 mg (1 mg Oral Given 07/12/20 1006)  calcitRIOL (ROCALTROL) capsule 0.25 mcg (0.25 mcg Oral Given 07/11/20 1333)  pantoprazole (PROTONIX) EC tablet 40 mg (40 mg Oral Given 07/12/20 1007)  apixaban (ELIQUIS) tablet 5 mg (5 mg Oral Given 07/12/20 2202)  lamoTRIgine (LAMICTAL) tablet 25 mg (25 mg Oral Given 07/12/20 2202)  arformoterol (BROVANA) nebulizer solution 15 mcg (15 mcg Nebulization Given 07/12/20 2048)  budesonide (PULMICORT) nebulizer solution 0.5 mg (0.5 mg Nebulization Given 07/12/20 2048)  fluticasone (FLONASE) 50 MCG/ACT nasal spray 2 spray (2 sprays Each Nare Not Given 07/12/20 2203)  montelukast (SINGULAIR) tablet 10 mg (10 mg Oral Given 07/12/20 2202)  clotrimazole (MYCELEX) troche 10 mg (has no administration in time range)  cyclobenzaprine (FLEXERIL) tablet 20 mg (20 mg Oral Given 07/12/20 2202)  insulin aspart (novoLOG) injection 0-20 Units (7 Units Subcutaneous Given 07/12/20 1725)  insulin glargine (LANTUS) injection 12 Units (12 Units Subcutaneous Given 07/13/20 0536)  influenza vac split quadrivalent PF (FLUARIX) injection 0.5 mL (has no administration in time range)  ferrous sulfate tablet 325 mg (325 mg Oral Given 07/12/20 1007)  promethazine (PHENERGAN) tablet 25 mg (25 mg Oral Given 07/12/20 2202)  ipratropium-albuterol (DUONEB) 0.5-2.5 (3) MG/3ML nebulizer solution 3 mL (3 mLs Nebulization Given 07/12/20 2048)   azaTHIOprine (IMURAN) tablet 125 mg (125 mg Oral Given 07/12/20 1007)  furosemide (LASIX) injection 40 mg (40 mg Intravenous Given 07/12/20 1724)  predniSONE (DELTASONE) tablet 5 mg (has no administration in time range)  perflutren lipid microspheres (DEFINITY) IV suspension (2 mLs Intravenous Given 07/12/20 0911)  oxyCODONE (Oxy IR/ROXICODONE) immediate release tablet 10 mg (10 mg Oral Given 07/11/20 1043)  ceFEPIme (MAXIPIME) 2 g in sodium chloride 0.9 % 100 mL IVPB (0 g Intravenous Stopped 07/11/20 1200)  azithromycin (ZITHROMAX) tablet 500 mg (500 mg Oral Given 07/11/20 1114)  acetaminophen (TYLENOL) tablet 650 mg (650 mg Oral Given 07/11/20 1114)  methylPREDNISolone sodium succinate (SOLU-MEDROL) 125 mg/2 mL injection 125 mg (125 mg Intravenous Given 07/11/20 1321)  furosemide (LASIX) injection 40 mg (40 mg Intravenous Given 07/11/20 1318)  magnesium sulfate IVPB 2 g 50 mL (2 g Intravenous New Bag/Given 07/11/20 1329)  insulin aspart (novoLOG) injection 6 Units (6 Units Subcutaneous Given 07/11/20 2212)    ED Course  I have reviewed the triage vital signs and the nursing notes.  Pertinent labs & imaging results that  were available during my care of the patient were reviewed by me and considered in my medical decision making (see chart for details).  Clinical Course as of Jul 13 752  Thu Jul 11, 2020  1152 Temp(!): 100.8 F (38.2 C) [AN]  Sat Jul 13, 2020  0752 Patient started complaining of chills.  Rectal temp shows low-grade fever.  White count is normal.  She is Covid negative.  Given that she is immunosuppressed, will start antibiotics for now.  Unclear etiology for fever.  Questionable sepsis at this time.   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   Patient has stopped taking Lasix for the last few days.  Per EMS she was hypoxic in the 70s when they arrived.  Patient on BiPAP when she arrived to the ER.  On exam she is noted to have rales at the base.  Positive distended neck veins.  Clinical suspicion is high for CHF exacerbation.  We will continue with BiPAP for now.  She will receive Lasix.  Appropriate labs and chest x-ray ordered.  Final Clinical Impression(s) / ED Diagnoses Final diagnoses:  Acute on chronic respiratory failure with hypoxemia (HCC)  Fever, unspecified fever cause  Acute pulmonary edema [facility])    Rx / DC Orders ED Discharge Orders    None       Varney Biles, MD 07/13/20 [id number]

## 2020-07-13 NOTE — Progress Notes (Signed)
PROGRESS NOTE    Katherine [name]   LGX:[id number]  DOB: 04/10/1956  DOA: 07/11/2020     2  PCP: [name], MD  CC: SOB, swelling  Hospital Course: Katherine [name] is a 64 y.o. female with medical history significant of HTN, HLD, PAF on Eliquis, CAD s/p CABG, RA, ESRD s/p renal transplant on chronic immunosuppressive therapy(Imuran and prednisone 5mg  daily), persistent asthma, chronic respiratory failure on 3 L, DM type II, and obesity s/p lap band who presented with SOB.   She had not been feeling well for approximately a week prior to admission.  She has also been having difficulty with breathing in her house due to her roommate smoking inside.  She had associated symptoms of a productive cough which is chronic for her and productive typically of brown/green sputum.  She has had worsened swelling in her legs and increased weight. She had been off of Lasix for approximately 1 week due to feeling dehydrated. In the ER she was found to be hypoxic (she is on 3 L chronically at home).  She was started on Lasix and began diuresing well.  CXR was also concerning for some interstitial opacities most likely edema but some concern for pneumonia on admission.  She was started on cefepime and azithromycin.  Her infectious work-up was negative and antibiotics were discontinued.  She diuresed well and had good improvement with her respiratory symptoms from admission.  She was able to be weaned down to her home setting of 3 L nasal cannula oxygen.   Interval History:  No events overnight.  Breathing has improved and she has been weaned down to 3 L however still short of breath and coughing some.  Continues to void well.  We discussed probably being stable for discharging home tomorrow.  Old records reviewed in assessment of this patient  ROS: Constitutional: negative for chills and fevers, Respiratory: positive for cough and SOB, Cardiovascular: negative for chest pain and  Gastrointestinal: negative for abdominal pain  Assessment & Plan: * Acute on chronic respiratory failure with hypoxia ([place]) -Likely due to volume overload in setting of being off of Lasix for approximately 1 week prior to admission.  With negative procalcitonin, no leukocytosis (understand she is on immunosuppressive therapy), very low-grade fever without persistence, will discontinue antibiotics for now and monitor off -Continue oxygen, she has been weaned down to home setting of 3 L  Acute on chronic diastolic heart failure ([place]) -Patient off of Lasix for approximately 1 week when home due to feeling dehydrated.  She exhibited signs of volume overload on admission with elevated BNP, pulmonary edema on CXR, lower extremity edema, and shortness of breath -Continue Lasix and monitor output -Last echo showed EF 55 to 60% with grade 3 diastolic dysfunction -Continues to diurese well with IV Lasix, continue for 1 more day  Asthma exacerbation - no further wheezing and her SOB is resolved - continue O2 and inhalers - reduce prednisone back to home 5 mg daily  Paroxysmal atrial fibrillation (HCC); CHA2DS2VASc score F, HTN, CAD, CVA = 5 Continue Eliquis -Continue Cardizem, metoprolol, Tikosyn -Followed outpatient by cardiology  SIRS (systemic inflammatory response syndrome) (HCC) -No obvious source of infection -Discontinue antibiotics.  Trend procalcitonin -Suspect that her clinical presentation more consistent with volume overload and possible asthma exacerbation.  She is nontoxic-appearing and much improved this morning  Normocytic anemia -Hemoglobin at baseline -Monitor CBC while in hospital  Type 2 diabetes mellitus with peripheral neuropathy (HCC) -Continue SSI and CBG  monitoring  Hypomagnesemia -Replete and recheck as needed  Morbid obesity - s/p Lap Band 9/'05 - ongoing lifestyle modification as able    Antimicrobials: Cefepime, stopped 11/26 Azithro, stopped 11/26  DVT  prophylaxis: Eliquis Code Status: Full Family Communication: none present Disposition Plan: Status is: Inpatient  Remains inpatient appropriate because:Unsafe d/c plan, IV treatments appropriate due to intensity of illness or inability to take PO and Inpatient level of care appropriate due to severity of illness   Dispo: The patient is from: Home              Anticipated d/c is to: Home              Anticipated d/c date is: 1 day              Patient currently is not medically stable to d/c.  Objective: Blood pressure (!) 135/58, pulse 60, temperature 98.3 F (36.8 C), temperature source Axillary, resp. rate 14, height 5\' 1"  (1.549 m), weight 103.9 kg, SpO2 98 %.  Examination: General appearance: alert, cooperative and no distress Head: Normocephalic, without obvious abnormality, atraumatic Eyes: EOMI Lungs: Bibasilar crackles, no wheezing Heart: irregularly irregular rhythm and S1, S2 normal Abdomen: normal findings: bowel sounds normal and soft, non-tender Extremities: 1+ lower extremity edema Skin: mobility and turgor normal Neurologic: Grossly normal  Consultants:   None  Procedures:   None  Data Reviewed: I have personally reviewed following labs and imaging studies Results for orders placed or performed during the hospital encounter of 07/11/20 (from the past 24 hour(s))  Glucose, capillary     Status: Abnormal   Collection Time: 07/12/20  5:18 PM  Result Value Ref Range   Glucose-Capillary 215 (H) 70 - 99 mg/dL  Basic metabolic panel     Status: Abnormal   Collection Time: 07/13/20  2:53 AM  Result Value Ref Range   Sodium 137 135 - 145 mmol/L   Potassium 3.7 3.5 - 5.1 mmol/L   Chloride 96 (L) 98 - 111 mmol/L   CO2 28 22 - 32 mmol/L   Glucose, Bld 235 (H) 70 - 99 mg/dL   BUN 20 8 - 23 mg/dL   Creatinine, Ser 1.02 (H) 0.44 - 1.00 mg/dL   Calcium 9.0 8.9 - 10.3 mg/dL   GFR, Estimated >60 >60 mL/min   Anion gap 13 5 - 15  CBC with Differential/Platelet      Status: Abnormal   Collection Time: 07/13/20  2:53 AM  Result Value Ref Range   WBC 10.3 4.0 - 10.5 K/uL   RBC 3.28 (L) 3.87 - 5.11 MIL/uL   Hemoglobin 10.0 (L) 12.0 - 15.0 g/dL   HCT 31.5 (L) 36 - 46 %   MCV 96.0 80.0 - 100.0 fL   MCH 30.5 26.0 - 34.0 pg   MCHC 31.7 30.0 - 36.0 g/dL   RDW 15.7 (H) 11.5 - 15.5 %   Platelets 237 150 - 400 K/uL   nRBC 0.0 0.0 - 0.2 %   Neutrophils Relative % 79 %   Neutro Abs 8.2 (H) 1.7 - 7.7 K/uL   Lymphocytes Relative 11 %   Lymphs Abs 1.1 0.7 - 4.0 K/uL   Monocytes Relative 8 %   Monocytes Absolute 0.8 0.1 - 1.0 K/uL   Eosinophils Relative 1 %   Eosinophils Absolute 0.1 0.0 - 0.5 K/uL   Basophils Relative 0 %   Basophils Absolute 0.0 0.0 - 0.1 K/uL   Immature Granulocytes 1 %   Abs Immature  Granulocytes 0.05 0.00 - 0.07 K/uL  Magnesium     Status: None   Collection Time: 07/13/20  2:53 AM  Result Value Ref Range   Magnesium 1.9 1.7 - 2.4 mg/dL  Glucose, capillary     Status: Abnormal   Collection Time: 07/13/20  5:36 AM  Result Value Ref Range   Glucose-Capillary 191 (H) 70 - 99 mg/dL  Glucose, capillary     Status: Abnormal   Collection Time: 07/13/20 12:04 PM  Result Value Ref Range   Glucose-Capillary 260 (H) 70 - 99 mg/dL   *Note: Due to a large number of results and/or encounters for the requested time period, some results have not been displayed. A complete set of results can be found in Results Review.    Recent Results (from the past 240 hour(s))  Resp Panel by RT-PCR (Flu A&B, Covid) Nasopharyngeal Swab     Status: None   Collection Time: 07/11/20  7:24 AM   Specimen: Nasopharyngeal Swab; Nasopharyngeal(NP) swabs in vial transport medium  Result Value Ref Range Status   SARS Coronavirus 2 by RT PCR NEGATIVE NEGATIVE Final    Comment: (NOTE) SARS-CoV-2 target nucleic acids are NOT DETECTED.  The SARS-CoV-2 RNA is generally detectable in upper respiratory specimens during the acute phase of infection. The  lowest concentration of SARS-CoV-2 viral copies this assay can detect is 138 copies/mL. A negative result does not preclude SARS-Cov-2 infection and should not be used as the sole basis for treatment or other patient management decisions. A negative result may occur with  improper specimen collection/handling, submission of specimen other than nasopharyngeal swab, presence of viral mutation(s) within the areas targeted by this assay, and inadequate number of viral copies(<138 copies/mL). A negative result must be combined with clinical observations, patient history, and epidemiological information. The expected result is Negative.  Fact Sheet for Patients:  EntrepreneurPulse.com.au  Fact Sheet for Healthcare Providers:  IncredibleEmployment.be  This test is no t yet approved or cleared by the [place] FDA and  has been authorized for detection and/or diagnosis of SARS-CoV-2 by FDA under an Emergency Use Authorization (EUA). This EUA will remain  in effect (meaning this test can be used) for the duration of the COVID-19 declaration under Section 564(b)(1) of the Act, 21 U.S.C.section 360bbb-3(b)(1), unless the authorization is terminated  or revoked sooner.       Influenza A by PCR NEGATIVE NEGATIVE Final   Influenza B by PCR NEGATIVE NEGATIVE Final    Comment: (NOTE) The Xpert Xpress SARS-CoV-2/FLU/RSV plus assay is intended as an aid in the diagnosis of influenza from Nasopharyngeal swab specimens and should not be used as a sole basis for treatment. Nasal washings and aspirates are unacceptable for Xpert Xpress SARS-CoV-2/FLU/RSV testing.  Fact Sheet for Patients: EntrepreneurPulse.com.au  Fact Sheet for Healthcare Providers: IncredibleEmployment.be  This test is not yet approved or cleared by the [place] FDA and has been authorized for detection and/or diagnosis of SARS-CoV-2 by FDA under  an Emergency Use Authorization (EUA). This EUA will remain in effect (meaning this test can be used) for the duration of the COVID-19 declaration under Section 564(b)(1) of the Act, 21 U.S.C. section 360bbb-3(b)(1), unless the authorization is terminated or revoked.  Performed at [facility], [place] [address] [place], [place] [address]   Blood culture (routine single)     Status: None (Preliminary result)   Collection Time: 07/11/20 10:50 AM   Specimen: BLOOD  Result Value Ref Range Status   Specimen  Description BLOOD SITE NOT SPECIFIED  Final   Special Requests   Final    BOTTLES DRAWN AEROBIC AND ANAEROBIC Blood Culture results may not be optimal due to an inadequate volume of blood received in culture bottles   Culture   Final    NO GROWTH 2 DAYS Performed at [facility], [place] [address] [place], [place] [address]    Report Status PENDING  Incomplete  Culture, blood (Routine X 2) w Reflex to ID Panel     Status: None (Preliminary result)   Collection Time: 07/11/20 11:04 AM   Specimen: BLOOD  Result Value Ref Range Status   Specimen Description BLOOD SITE NOT SPECIFIED  Final   Special Requests   Final    BOTTLES DRAWN AEROBIC AND ANAEROBIC Blood Culture results may not be optimal due to an inadequate volume of blood received in culture bottles   Culture   Final    NO GROWTH 2 DAYS Performed at [facility], [place] [address], [place], [place] [address]    Report Status PENDING  Incomplete  Culture, blood (single)     Status: None (Preliminary result)   Collection Time: 07/11/20  2:36 PM   Specimen: BLOOD  Result Value Ref Range Status   Specimen Description BLOOD LEFT ANTECUBITAL  Final   Special Requests   Final    BOTTLES DRAWN AEROBIC AND ANAEROBIC Blood Culture results may not be optimal due to an inadequate volume of blood received in culture bottles   Culture   Final    NO GROWTH 2 DAYS Performed at [facility], [place] [address],  [place], [place] [address]    Report Status PENDING  Incomplete     Radiology Studies: ECHOCARDIOGRAM COMPLETE  Result Date: 07/12/2020    ECHOCARDIOGRAM REPORT   Patient Name:   ROXANN VIERRA Date of Exam: 07/12/2020 Medical Rec #:  [id number]          Height:       61.0 in Accession #:    [id number]         Weight:       229.1 lb Date of Birth:  1955-11-22          BSA:          2.001 m Patient Age:    77 years           BP:           160/74 mmHg Patient Gender: F                  HR:           85 bpm. Exam Location:  Inpatient Procedure: 2D Echo, Cardiac Doppler, Color Doppler and Intracardiac            Opacification Agent Indications:    Abnormal ECG 794.31 / R94.31  History:        Patient has prior history of Echocardiogram examinations, most                 recent 12/25/2019. Previous Myocardial Infarction and CAD, Prior                 CABG, Stroke and COPD, Aortic Valve Disease,                 Signs/Symptoms:Chest Pain; Risk Factors:Hypertension, Diabetes,                 Dyslipidemia and Former Smoker. GERD.  Sonographer:    Vickie Epley RDCS Referring Phys: [id number] RONDELL A SMITH IMPRESSIONS  1. There is no left ventricular thrombus with Definity contrast. 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 Left ventricular ejection fraction, by estimation, is 40 to 45%. The left ventricle has mildly decreased function. The left ventricle demonstrates regional wall motion abnormalities (see scoring diagram/findings for description). There is moderate concentric left ventricular hypertrophy. Left ventricular diastolic parameters are consistent with Grade II diastolic dysfunction (pseudonormalization). Elevated left atrial pressure. There is moderate hypokinesis of the left ventricular, mid-apical anterior wall and anteroseptal wall. There is mild dyskinesis of the left ventricular, entire apical segment.  2. Right ventricular systolic function is normal. The right ventricular size is normal. Tricuspid regurgitation  signal is inadequate for assessing PA pressure.  3. Left atrial size was moderately dilated.  4. The mitral valve is normal in structure. Mild mitral valve regurgitation. Moderate mitral annular calcification.  5. The aortic valve is bicuspid. Aortic valve regurgitation is not visualized. Mild aortic valve sclerosis is present, with no evidence of aortic valve stenosis.  6. The inferior vena cava is normal in size with greater than 50% respiratory variability, suggesting right atrial pressure of 3 mmHg. Comparison(s): Prior images reviewed side by side. Similar left ventricular wall motion abnormalities and EF were probably present on the previous study, but not well identified without the use of Definity contrast. FINDINGS  Left Ventricle: There is no left ventricular thrombus with Definity contrast. 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 Left ventricular ejection fraction, by estimation, is 40 to 45%. The left ventricle has mildly decreased function. The left ventricle demonstrates regional wall motion abnormalities. Moderate hypokinesis of the left ventricular, mid-apical anterior wall and anteroseptal wall. Mild dyskinesis of the left ventricular, entire apical segment. Definity contrast agent was given IV to delineate the left ventricular endocardial borders. The left ventricular internal cavity size was normal in size. There is moderate concentric left ventricular hypertrophy. Left ventricular diastolic parameters are consistent with Grade II diastolic dysfunction (pseudonormalization). Elevated left atrial pressure.  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 The mid and distal anterior wall, mid and distal anterior septum, and apical inferior segment are hypokinetic. The entire lateral wall, inferior wall, basal anteroseptal segment, mid inferoseptal segment, basal anterior segment, and basal inferoseptal segment are normal. Right Ventricle: The right ventricular size is normal. No increase in right ventricular  wall thickness. Right ventricular systolic function is normal. Tricuspid regurgitation signal is inadequate for assessing PA pressure. Left Atrium: Left atrial size was moderately dilated. Right Atrium: Right atrial size was normal in size. Pericardium: There is no evidence of pericardial effusion. Mitral Valve: The mitral valve is normal in structure. Moderate mitral annular calcification. Mild mitral valve regurgitation. Tricuspid Valve: The tricuspid valve is normal in structure. Tricuspid valve regurgitation is not demonstrated. Aortic Valve: The right and left aortic cusps are fused. The aortic valve is bicuspid. Aortic valve regurgitation is not visualized. Mild aortic valve sclerosis is present, with no evidence of aortic valve stenosis. Aortic valve mean gradient measures 11.0 mmHg. Aortic valve peak gradient measures 22.3 mmHg. Aortic valve area, by VTI measures 2.05 cm. Pulmonic Valve: The pulmonic valve was grossly normal. Pulmonic valve regurgitation is not visualized. Aorta: The aortic root and ascending aorta are structurally normal, with no evidence of dilitation. Venous: The inferior vena cava is normal in size with greater than 50% respiratory variability, suggesting right atrial pressure of 3 mmHg. IAS/Shunts: No atrial level shunt detected by color flow  Doppler.  LEFT VENTRICLE PLAX 2D LVIDd:         3.60 cm      Diastology LVIDs:         2.20 cm      LV e' medial:    2.89 cm/s LV PW:         1.60 cm      LV E/e' medial:  58.8 LV IVS:        1.40 cm      LV e' lateral:   5.22 cm/s LVOT diam:     2.20 cm      LV E/e' lateral: 32.6 LV SV:         91 LV SV Index:   45 LVOT Area:     3.80 cm  LV Volumes (MOD) LV vol d, MOD A2C: 89.2 ml LV vol d, MOD A4C: 117.0 ml LV vol s, MOD A2C: 57.2 ml LV vol s, MOD A4C: 72.9 ml LV SV MOD A2C:     32.0 ml LV SV MOD A4C:     117.0 ml LV SV MOD BP:      40.7 ml RIGHT VENTRICLE RV S prime:     10.10 cm/s LEFT ATRIUM             Index       RIGHT ATRIUM            Index LA diam:        4.70 cm 2.35 cm/m  RA Area:     16.60 cm LA Vol (A2C):   46.0 ml 22.99 ml/m RA Volume:   43.00 ml  21.49 ml/m LA Vol (A4C):   55.8 ml 27.89 ml/m LA Biplane Vol: 51.7 ml 25.84 ml/m  AORTIC VALVE AV Area (Vmax):    1.98 cm AV Area (Vmean):   2.07 cm AV Area (VTI):     2.05 cm AV Vmax:           236.00 cm/s AV Vmean:          149.000 cm/s AV VTI:            0.444 m AV Peak Grad:      22.3 mmHg AV Mean Grad:      11.0 mmHg LVOT Vmax:         123.00 cm/s LVOT Vmean:        81.100 cm/s LVOT VTI:          0.239 m LVOT/AV VTI ratio: 0.54  AORTA Ao Root diam: 3.00 cm MITRAL VALVE MV Area (PHT): 4.68 cm     SHUNTS MV Decel Time: 162 msec     Systemic VTI:  0.24 m MR Peak grad: 88.7 mmHg     Systemic Diam: 2.20 cm MR Vmax:      471.00 cm/s MV E velocity: 170.00 cm/s MV A velocity: 107.00 cm/s MV E/A ratio:  1.59 Mihai Croitoru MD Electronically signed by Sanda Klein MD Signature Date/Time: 07/12/2020/10:02:16 AM    Final    DG Chest Portable 1 View  Final Result      Scheduled Meds: . apixaban  5 mg Oral BID  . arformoterol  15 mcg Nebulization BID  . azaTHIOprine  125 mg Oral Daily  . budesonide  2 mL Nebulization BID  . calcitRIOL  0.25 mcg Oral Q3 days  . cyclobenzaprine  20 mg Oral QHS  . diltiazem  120 mg Oral Daily  . dofetilide  250 mcg Oral BID  . DULoxetine  30 mg Oral Daily  . ferrous sulfate  325 mg Oral Q breakfast  . fluticasone  2 spray Each Nare BID  . furosemide  40 mg Intravenous BID  . influenza vac split quadrivalent PF  0.5 mL Intramuscular Tomorrow-1000  . insulin aspart  0-20 Units Subcutaneous TID WC  . insulin glargine  12 Units Subcutaneous QAC breakfast  . ipratropium-albuterol  3 mL Nebulization BID  . lamoTRIgine  25 mg Oral QHS  . metoprolol tartrate  50 mg Oral BID  . montelukast  10 mg Oral QHS  . oxyCODONE  10 mg Oral TID  . pantoprazole  40 mg Oral Daily  . predniSONE  5 mg Oral Q breakfast  . promethazine  25 mg Oral QHS  .  rosuvastatin  20 mg Oral Daily  . sertraline  100 mg Oral Daily  . sodium chloride flush  3 mL Intravenous Q12H   PRN Meds: acetaminophen **OR** acetaminophen, clotrimazole, LORazepam Continuous Infusions:   LOS: 2 days  Time spent: Greater than 50% of the 35 minute visit was spent in counseling/coordination of care for the patient as laid out in the A&P.   Dwyane Dee, MD [facility] 07/13/2020, 2:21 PM

## 2020-07-14 LAB — GLUCOSE, CAPILLARY
Glucose-Capillary: 170 mg/dL — ABNORMAL HIGH (ref 70–99)
Glucose-Capillary: 235 mg/dL — ABNORMAL HIGH (ref 70–99)

## 2020-07-14 LAB — CBC WITH DIFFERENTIAL/PLATELET
Abs Immature Granulocytes: 0.04 10*3/uL (ref 0.00–0.07)
Basophils Absolute: 0 10*3/uL (ref 0.0–0.1)
Basophils Relative: 1 %
Eosinophils Absolute: 0.2 10*3/uL (ref 0.0–0.5)
Eosinophils Relative: 2 %
HCT: 32.4 % — ABNORMAL LOW (ref 36.0–46.0)
Hemoglobin: 10.2 g/dL — ABNORMAL LOW (ref 12.0–15.0)
Immature Granulocytes: 1 %
Lymphocytes Relative: 17 %
Lymphs Abs: 1.5 10*3/uL (ref 0.7–4.0)
MCH: 30.3 pg (ref 26.0–34.0)
MCHC: 31.5 g/dL (ref 30.0–36.0)
MCV: 96.1 fL (ref 80.0–100.0)
Monocytes Absolute: 0.8 10*3/uL (ref 0.1–1.0)
Monocytes Relative: 9 %
Neutro Abs: 6.2 10*3/uL (ref 1.7–7.7)
Neutrophils Relative %: 70 %
Platelets: 255 10*3/uL (ref 150–400)
RBC: 3.37 MIL/uL — ABNORMAL LOW (ref 3.87–5.11)
RDW: 15.7 % — ABNORMAL HIGH (ref 11.5–15.5)
WBC: 8.9 10*3/uL (ref 4.0–10.5)
nRBC: 0 % (ref 0.0–0.2)

## 2020-07-14 LAB — BASIC METABOLIC PANEL
Anion gap: 14 (ref 5–15)
BUN: 20 mg/dL (ref 8–23)
CO2: 28 mmol/L (ref 22–32)
Calcium: 9.1 mg/dL (ref 8.9–10.3)
Chloride: 94 mmol/L — ABNORMAL LOW (ref 98–111)
Creatinine, Ser: 0.97 mg/dL (ref 0.44–1.00)
GFR, Estimated: >60 mL/min (ref >60)
Glucose, Bld: 195 mg/dL — ABNORMAL HIGH (ref 70–99)
Potassium: 3.8 mmol/L (ref 3.5–5.1)
Sodium: 136 mmol/L (ref 135–145)

## 2020-07-14 LAB — MAGNESIUM: Magnesium: 1.9 mg/dL (ref 1.7–2.4)

## 2020-07-14 MED ORDER — FUROSEMIDE 40 MG PO TABS
40.0000 mg | ORAL_TABLET | Freq: Every day | ORAL | 3 refills | Status: DC
Start: 2020-07-14 — End: 2021-05-18

## 2020-07-14 MED ORDER — PROMETHAZINE HCL 25 MG PO TABS: 25.0000 mg | ORAL_TABLET | Freq: Every evening | ORAL | 0 refills | Status: DC | PRN

## 2020-07-14 MED ORDER — NYSTATIN 100000 UNIT/ML MT SUSP
5.0000 mL | Freq: Four times a day (QID) | OROMUCOSAL | Status: DC
  Administered 2020-07-14 (×3): 500000 [IU] via ORAL
  Filled 2020-07-14 (×3): qty 5

## 2020-07-14 MED ORDER — AMOXICILLIN-POT CLAVULANATE 875-125 MG PO TABS: 1.0000 | ORAL_TABLET | Freq: Two times a day (BID) | ORAL | 0 refills | Status: AC

## 2020-07-14 MED ORDER — POTASSIUM CHLORIDE CRYS ER 20 MEQ PO TBCR
40.0000 meq | EXTENDED_RELEASE_TABLET | Freq: Once | ORAL | Status: AC
  Administered 2020-07-14: 40 meq via ORAL
  Filled 2020-07-14: qty 2

## 2020-07-14 NOTE — Progress Notes (Signed)
ivs removed. D/c education given and pt verbalized understanding.

## 2020-07-14 NOTE — Discharge Summary (Signed)
Physician Discharge Summary   Katherine [name] Katherine [name]:[id number] DOB: 1956/07/02 DOA: 07/11/2020  PCP: Katherine Lima, MD  Admit date: 07/11/2020 Discharge date: 07/14/2020  Admitted From: home Disposition:  home Discharging physician: Dwyane Dee, MD  Recommendations for Outpatient Follow-up:  1. Patient encouraged to continue lasix at discharge  Patient discharged to home in Discharge Condition: stable CODE STATUS: full Diet recommendation:  Diet Orders (From admission, onward)    Start     Ordered   07/14/20 0000  Diet - low sodium heart healthy        07/14/20 0955   07/14/20 0000  Diet Carb Modified        07/14/20 0955   07/11/20 1204  Diet heart healthy/carb modified Room service appropriate? Yes; Fluid consistency: Thin  Diet effective now       Question Answer Comment  Diet-HS Snack? Nothing   Room service appropriate? Yes   Fluid consistency: Thin      07/11/20 1203          Hospital Course: Katherine [name] is a 64 y.o. female with medical history significant of HTN, HLD, PAF on Eliquis, CAD s/p CABG, RA, ESRD s/p renal transplant on chronic immunosuppressive therapy(Imuran and prednisone 45m daily), persistent asthma, chronic respiratory failure on 3 L, DM type II, and obesity s/p lap band who presented with SOB.   She had not been feeling well for approximately a week prior to admission.  She has also been having difficulty with breathing in her house due to her roommate smoking inside.  She had associated symptoms of a productive cough which is chronic for her and productive typically of brown/green sputum.  She has had worsened swelling in her legs and increased weight. She had been off of Lasix for approximately 1 week due to feeling dehydrated. In the ER she was found to be hypoxic (she is on 3 L chronically at home).  She was started on Lasix and began diuresing well.  CXR was also concerning for some interstitial opacities most likely edema but some  concern for pneumonia on admission.  She was started on cefepime and azithromycin.  Her infectious work-up was negative and antibiotics were discontinued. Prior to discharge she did have some noticeable brownish/yellow sputum; she was therefore discharged home with a 5-day course of Augmentin.  She diuresed well and had good improvement with her respiratory symptoms from admission.  She was able to be weaned down to her home setting of 3 L nasal cannula oxygen. She was continued on Lasix at discharge.    * Acute on chronic respiratory failure with hypoxia (HCC) -Likely due to volume overload in setting of being off of Lasix for approximately 1 week prior to admission.  With negative procalcitonin, no leukocytosis (understand she is on immunosuppressive therapy), very low-grade fever without persistence, will discontinue antibiotics for now and monitor off -Continue oxygen, she has been weaned down to home setting of 3 L -Given some appearance of her sputum being brown/yellow just prior to discharge, she was given a 5-day course of Augmentin to complete upon returning home  Acute on chronic diastolic heart failure ([place] -Patient off of Lasix for approximately 1 week when home due to feeling dehydrated.  She exhibited signs of volume overload on admission with elevated BNP, pulmonary edema on CXR, lower extremity edema, and shortness of breath -Continue Lasix and monitor output -Last echo showed EF 55 to 60% with grade 3 diastolic dysfunction -Continues to diurese well  Asthma  exacerbation - no further wheezing and her SOB is resolved - continue O2 and inhalers - reduce prednisone back to home 5 mg daily  Paroxysmal atrial fibrillation (HCC); CHA2DS2VASc score F, HTN, CAD, CVA = 5 Continue Eliquis -Continue Cardizem, metoprolol, Tikosyn -Followed outpatient by cardiology  SIRS (systemic inflammatory response syndrome) (HCC) -No obvious source of infection -Discontinue antibiotics.  Trend  procalcitonin -Suspect that her clinical presentation more consistent with volume overload and possible asthma exacerbation.  She is nontoxic-appearing and much improved this morning  Normocytic anemia -Hemoglobin at baseline -Monitor CBC while in hospital  Type 2 diabetes mellitus with peripheral neuropathy ([place]) -Continue SSI and CBG monitoring  Hypomagnesemia -Replete and recheck as needed  Morbid obesity - s/p Lap Band 9/'05 - ongoing lifestyle modification as able    The patient's chronic medical conditions were treated accordingly per the patient's home medication regimen except as noted.  On day of discharge, patient was felt deemed stable for discharge. Patient/family member advised to call PCP or come back to ER if needed.   Principal Diagnosis: Acute on chronic respiratory failure with hypoxia [facility])  Discharge Diagnoses: Active Hospital Problems   Diagnosis Date Noted  . Acute on chronic respiratory failure with hypoxia (HCC) 07/11/2020    Priority: High  . Acute on chronic diastolic heart failure ([place]) 12/26/2019    Priority: High  . Asthma exacerbation 07/23/2019    Priority: Medium  . Paroxysmal atrial fibrillation (HCC); CHA2DS2VASc score F, HTN, CAD, CVA = 5 06/28/2014    Priority: Medium  . SIRS (systemic inflammatory response syndrome) ([place]) 07/11/2020    Priority: Low  . Normocytic anemia 10/13/2016  . Type 2 diabetes mellitus with peripheral neuropathy ([place]) 03/30/2016  . Hypomagnesemia 06/13/2012  . Morbid obesity - s/p Lap Band 9/'05 05/07/2004    Class: Diagnosis of    Resolved Hospital Problems  No resolved problems to display.    Discharge Instructions    Diet - low sodium heart healthy   Complete by: As directed    Diet Carb Modified   Complete by: As directed    Increase activity slowly   Complete by: As directed      Allergies as of 07/14/2020      Reactions   Tetracycline Hives   Patient tolerated Doxycycline Dec 2020   Niacin Other  (See Comments)   Mouth blisters   Niaspan [niacin Er] Other (See Comments)   Mouth blisters   Sulfa Antibiotics Nausea Only, Other (See Comments)   "Tears up stomach"   Sulfonamide Derivatives Other (See Comments)   Reaction: per patient "tears her stomach up"   Codeine Nausea And Vomiting   Erythromycin Nausea And Vomiting   Hydromorphone Hcl Nausea And Vomiting   Morphine And Related Nausea And Vomiting   Nalbuphine Nausea And Vomiting   Nubain   Sulfasalazine Nausea Only, Other (See Comments)   per patient "tears her stomach up", "Tears up stomach"   Tape Rash, Other (See Comments)   No "plastic" tape," please      Medication List    STOP taking these medications   magnesium oxide 400 MG tablet Commonly known as: MAG-OX     TAKE these medications   acetaminophen 500 MG tablet Commonly known as: TYLENOL Take 1,500 mg by mouth daily.   albuterol (2.5 MG/3ML) 0.083% nebulizer solution Commonly known as: PROVENTIL Take 3 mLs (2.5 mg total) by nebulization every 6 (six) hours as needed for wheezing or shortness of breath (dx: J45.20). What changed:  Another medication with the same name was changed. Make sure you understand how and when to take each.   albuterol 108 (90 Base) MCG/ACT inhaler Commonly known as: VENTOLIN HFA TAKE 2 PUFFS BY MOUTH EVERY 6 HOURS AS NEEDED FOR WHEEZE OR SHORTNESS OF BREATH What changed: See the new instructions.   amoxicillin-clavulanate 875-125 MG tablet Commonly known as: Augmentin Take 1 tablet by mouth every 12 (twelve) hours for 5 days.   arformoterol 15 MCG/2ML Nebu Commonly known as: BROVANA Take 2 mLs (15 mcg total) by nebulization 2 (two) times daily. What changed: additional instructions   azaTHIOprine 50 MG tablet Commonly known as: IMURAN Take 125 mg by mouth daily.   B-D ULTRAFINE III SHORT PEN 31G X 8 MM Misc Generic drug: Insulin Pen Needle See admin instructions.   Basaglar KwikPen 100 UNIT/ML Inject 12 Units into  the skin daily before breakfast.   budesonide 0.5 MG/2ML nebulizer solution Commonly known as: PULMICORT Take 2 mLs (0.5 mg total) by nebulization 2 (two) times daily. What changed: additional instructions   calcitRIOL 0.25 MCG capsule Commonly known as: ROCALTROL Take 0.25 mcg by mouth every 3 (three) days.   clotrimazole 10 MG troche Commonly known as: MYCELEX Take 1 tablet (10 mg total) by mouth 5 (five) times daily. What changed:   when to take this  reasons to take this   cyclobenzaprine 10 MG tablet Commonly known as: FLEXERIL TAKE 1 TABLET BY MOUTH 3 TIMES DAILY AS NEEDED FOR MUSCLE SPASMS What changed: See the new instructions.   diltiazem 120 MG 24 hr capsule Commonly known as: CARDIZEM CD TAKE 1 CAPSULE BY MOUTH EVERY DAY What changed: how much to take   diltiazem 30 MG tablet Commonly known as: CARDIZEM TAKE 1 TABLET EVERY 4 HOURS AS NEEDED FOR AFIB HEART RATE >100 What changed: See the new instructions.   dofetilide 250 MCG capsule Commonly known as: TIKOSYN TAKE 1 CAPSULE BY MOUTH TWICE A DAY What changed:   how much to take  how to take this  when to take this   DULoxetine 30 MG capsule Commonly known as: CYMBALTA Take 30 mg by mouth daily.   Eliquis 5 MG Tabs tablet Generic drug: apixaban TAKE 1 TABLET BY MOUTH TWICE A DAY (TO REPLACE WARFARIN) What changed: See the new instructions.   ferrous sulfate 325 (65 FE) MG tablet Take 1 tablet (325 mg total) by mouth daily with breakfast.   fluticasone 50 MCG/ACT nasal spray Commonly known as: FLONASE PLACE 2 SPRAYS INTO BOTH NOSTRILS 2 TIMES DAILY What changed: See the new instructions.   furosemide 40 MG tablet Commonly known as: LASIX Take 40 mg by mouth daily.   glimepiride 4 MG tablet Commonly known as: AMARYL Take 4 mg by mouth daily.   lamoTRIgine 25 MG tablet Commonly known as: LAMICTAL TAKE 1 TABLET BY MOUTH EVERYDAY AT BEDTIME What changed: See the new instructions.     LORazepam 1 MG tablet Commonly known as: ATIVAN Take 1 tablet (1 mg total) by mouth every 8 (eight) hours as needed for anxiety. Takes daily What changed:   when to take this  additional instructions   metFORMIN 500 MG tablet Commonly known as: GLUCOPHAGE Take 500 mg by mouth 2 (two) times daily.   metoprolol tartrate 50 MG tablet Commonly known as: LOPRESSOR TAKE 1 TABLET BY MOUTH TWICE A DAY What changed:   when to take this  additional instructions   montelukast 10 MG tablet Commonly known as: SINGULAIR TAKE  1 TABLET BY MOUTH EVERY DAY What changed: when to take this   nitroGLYCERIN 0.4 MG SL tablet Commonly known as: NITROSTAT Place 1 tablet (0.4 mg total) under the tongue every 5 (five) minutes as needed for chest pain.   NovoLOG FlexPen 100 UNIT/ML FlexPen Generic drug: insulin aspart Inject 10 Units into the skin 2 (two) times daily after a meal.   omeprazole 20 MG capsule Commonly known as: PRILOSEC Take 20 mg by mouth daily.   ONE TOUCH ULTRA 2 w/Device Kit 2 (two) times daily.   OneTouch Delica Plus [id number] Misc daily. as directed   OneTouch Ultra test strip Generic drug: glucose blood USE AS DIRECTED 3 TIMES DAILY TO CHECK BLOOD SUGAR   Oxycodone HCl 10 MG Tabs Take 1 tablet (10 mg total) by mouth every 8 (eight) hours as needed. What changed: when to take this   OXYGEN Inhale 3 L into the lungs continuous.   predniSONE 5 MG tablet Commonly known as: DELTASONE Take 5 mg by mouth daily.   promethazine 25 MG tablet Commonly known as: PHENERGAN Take 1 tablet (25 mg total) by mouth at bedtime as needed for nausea. For nausea and sleep What changed:   when to take this  reasons to take this   rosuvastatin 20 MG tablet Commonly known as: CRESTOR Take 1 tablet (20 mg total) by mouth daily.   sertraline 100 MG tablet Commonly known as: ZOLOFT Take 100 mg by mouth daily.       Allergies  Allergen Reactions  . Tetracycline Hives     Patient tolerated Doxycycline Dec 2020  . Niacin Other (See Comments)    Mouth blisters  . Niaspan [Niacin Er] Other (See Comments)    Mouth blisters  . Sulfa Antibiotics Nausea Only and Other (See Comments)    "Tears up stomach"  . Sulfonamide Derivatives Other (See Comments)    Reaction: per patient "tears her stomach up"  . Codeine Nausea And Vomiting  . Erythromycin Nausea And Vomiting  . Hydromorphone Hcl Nausea And Vomiting  . Morphine And Related Nausea And Vomiting  . Nalbuphine Nausea And Vomiting    Nubain  . Sulfasalazine Nausea Only and Other (See Comments)    per patient "tears her stomach up", "Tears up stomach"  . Tape Rash and Other (See Comments)    No "plastic" tape," please    Consultations:   Discharge Exam: BP (!) 150/62 (BP Location: Right Arm)   Pulse (!) 58   Temp 98.1 F (36.7 C) (Oral)   Resp 18   Ht _0  (1.549 m)   Wt 102.3 kg   SpO2 94%   BMI 42.63 kg/m  General appearance: alert, cooperative and no distress Head: Normocephalic, without obvious abnormality, atraumatic Eyes: EOMI Lungs: Bibasilar crackles, no wheezing Heart: irregularly irregular rhythm and S1, S2 normal Abdomen: normal findings: bowel sounds normal and soft, non-tender Extremities: 1+ lower extremity edema Skin: mobility and turgor normal Neurologic: Grossly normal  The results of significant diagnostics from this hospitalization (including imaging, microbiology, ancillary and laboratory) are listed below for reference.   Microbiology: Recent Results (from the past 240 hour(s))  Resp Panel by RT-PCR (Flu A&B, Covid) Nasopharyngeal Swab     Status: None   Collection Time: 07/11/20  7:24 AM   Specimen: Nasopharyngeal Swab; Nasopharyngeal(NP) swabs in vial transport medium  Result Value Ref Range Status   SARS Coronavirus 2 by RT PCR NEGATIVE NEGATIVE Final    Comment: (NOTE) SARS-CoV-2 target nucleic acids  are NOT DETECTED.  The SARS-CoV-2 RNA is generally  detectable in upper respiratory specimens during the acute phase of infection. The lowest concentration of SARS-CoV-2 viral copies this assay can detect is 138 copies/mL. A negative result does not preclude SARS-Cov-2 infection and should not be used as the sole basis for treatment or other patient management decisions. A negative result may occur with  improper specimen collection/handling, submission of specimen other than nasopharyngeal swab, presence of viral mutation(s) within the areas targeted by this assay, and inadequate number of viral copies(<138 copies/mL). A negative result must be combined with clinical observations, patient history, and epidemiological information. The expected result is Negative.  Fact Sheet for Patients:  EntrepreneurPulse.com.au  Fact Sheet for Healthcare Providers:  IncredibleEmployment.be  This test is no t yet approved or cleared by the [place] FDA and  has been authorized for detection and/or diagnosis of SARS-CoV-2 by FDA under an Emergency Use Authorization (EUA). This EUA will remain  in effect (meaning this test can be used) for the duration of the COVID-19 declaration under Section 564(b)(1) of the Act, 21 U.S.C.section 360bbb-3(b)(1), unless the authorization is terminated  or revoked sooner.       Influenza A by PCR NEGATIVE NEGATIVE Final   Influenza B by PCR NEGATIVE NEGATIVE Final    Comment: (NOTE) The Xpert Xpress SARS-CoV-2/FLU/RSV plus assay is intended as an aid in the diagnosis of influenza from Nasopharyngeal swab specimens and should not be used as a sole basis for treatment. Nasal washings and aspirates are unacceptable for Xpert Xpress SARS-CoV-2/FLU/RSV testing.  Fact Sheet for Patients: EntrepreneurPulse.com.au  Fact Sheet for Healthcare Providers: IncredibleEmployment.be  This test is not yet approved or cleared by the [place] FDA  and has been authorized for detection and/or diagnosis of SARS-CoV-2 by FDA under an Emergency Use Authorization (EUA). This EUA will remain in effect (meaning this test can be used) for the duration of the COVID-19 declaration under Section 564(b)(1) of the Act, 21 U.S.C. section 360bbb-3(b)(1), unless the authorization is terminated or revoked.  Performed at [facility], [place] [address], [place], [place] [address]   Blood culture (routine single)     Status: None (Preliminary result)   Collection Time: 07/11/20 10:50 AM   Specimen: BLOOD  Result Value Ref Range Status   Specimen Description BLOOD SITE NOT SPECIFIED  Final   Special Requests   Final    BOTTLES DRAWN AEROBIC AND ANAEROBIC Blood Culture results may not be optimal due to an inadequate volume of blood received in culture bottles   Culture   Final    NO GROWTH 3 DAYS Performed at [facility], [place] [address], [place], [place] [address]    Report Status PENDING  Incomplete  Culture, blood (Routine X 2) w Reflex to ID Panel     Status: None (Preliminary result)   Collection Time: 07/11/20 11:04 AM   Specimen: BLOOD  Result Value Ref Range Status   Specimen Description BLOOD SITE NOT SPECIFIED  Final   Special Requests   Final    BOTTLES DRAWN AEROBIC AND ANAEROBIC Blood Culture results may not be optimal due to an inadequate volume of blood received in culture bottles   Culture   Final    NO GROWTH 3 DAYS Performed at [facility], [place] [address], [place], [place] [address]    Report Status PENDING  Incomplete  Culture, blood (single)     Status: None (Preliminary result)  Collection Time: 07/11/20  2:36 PM   Specimen: BLOOD  Result Value Ref Range Status   Specimen Description BLOOD LEFT ANTECUBITAL  Final   Special Requests   Final    BOTTLES DRAWN AEROBIC AND ANAEROBIC Blood Culture results may not be optimal due to an inadequate volume of blood received in culture bottles   Culture   Final      NO GROWTH 3 DAYS Performed at [facility], [place] [address] [place], [place] [address]    Report Status PENDING  Incomplete     Labs: BNP (last 3 results) Recent Labs    07/23/19 1830 12/25/19 1401 07/11/20 0750  BNP 585.6* 422.2* 182.9*   Basic Metabolic Panel: Recent Labs  Lab 07/11/20 0750 07/12/20 0355 07/13/20 0253 07/14/20 0348  NA 139 137 137 136  K 4.0 4.6 3.7 3.8  CL 102 100 96* 94*  CO2 _0 GLUCOSE 221* 236* 235* 195*  BUN _1 CREATININE 0.99 0.98 1.02* 0.97  CALCIUM 9.3 9.5 9.0 9.1  MG 1.6* 2.0 1.9 1.9   Liver Function Tests: Recent Labs  Lab 07/12/20 0355  AST 21  ALT 20  ALKPHOS 95  BILITOT 0.9  PROT 7.1  ALBUMIN 3.2*   No results for input(s): LIPASE, AMYLASE in the last 168 hours. No results for input(s): AMMONIA in the last 168 hours. CBC: Recent Labs  Lab 07/11/20 0750 07/12/20 0355 07/13/20 0253 07/14/20 0348  WBC 8.7 8.6 10.3 8.9  NEUTROABS  --   --  8.2* 6.2  HGB 10.5* 10.7* 10.0* 10.2*  HCT 34.9* 33.4* 31.5* 32.4*  MCV 99.4 94.4 96.0 96.1  PLT 239 214 237 255   Cardiac Enzymes: No results for input(s): CKTOTAL, CKMB, CKMBINDEX, TROPONINI in the last 168 hours. BNP: Invalid input(s): POCBNP CBG: Recent Labs  Lab 07/13/20 1204 07/13/20 1638 07/13/20 1955 07/14/20 0537 07/14/20 1208  GLUCAP 260* 282* 202* 170* 235*   D-Dimer No results for input(s): DDIMER in the last 72 hours. Hgb A1c No results for input(s): HGBA1C in the last 72 hours. Lipid Profile No results for input(s): CHOL, HDL, LDLCALC, TRIG, CHOLHDL, LDLDIRECT in the last 72 hours. Thyroid function studies No results for input(s): TSH, T4TOTAL, T3FREE, THYROIDAB in the last 72 hours.  Invalid input(s): FREET3 Anemia work up No results for input(s): VITAMINB12, FOLATE, FERRITIN, TIBC, IRON, RETICCTPCT in the last 72 hours. Urinalysis    Component Value Date/Time   COLORURINE YELLOW 08/08/2019 1722   APPEARANCEUR CLEAR  08/08/2019 1722   LABSPEC 1.011 08/08/2019 1722   PHURINE 9.0 (H) 08/08/2019 1722   GLUCOSEU NEGATIVE 08/08/2019 1722   HGBUR NEGATIVE 08/08/2019 1722   BILIRUBINUR NEGATIVE 08/08/2019 1722   KETONESUR NEGATIVE 08/08/2019 1722   PROTEINUR NEGATIVE 08/08/2019 1722   UROBILINOGEN 0.2 10/16/2013 0110   NITRITE NEGATIVE 08/08/2019 1722   LEUKOCYTESUR NEGATIVE 08/08/2019 1722   Sepsis Labs Invalid input(s): PROCALCITONIN,  WBC,  LACTICIDVEN Microbiology Recent Results (from the past 240 hour(s))  Resp Panel by RT-PCR (Flu A&B, Covid) Nasopharyngeal Swab     Status: None   Collection Time: 07/11/20  7:24 AM   Specimen: Nasopharyngeal Swab; Nasopharyngeal(NP) swabs in vial transport medium  Result Value Ref Range Status   SARS Coronavirus 2 by RT PCR NEGATIVE NEGATIVE Final    Comment: (NOTE) SARS-CoV-2 target nucleic acids are NOT DETECTED.  The SARS-CoV-2 RNA is generally detectable in upper respiratory specimens during the acute phase of infection. The  lowest concentration of SARS-CoV-2 viral copies this assay can detect is 138 copies/mL. A negative result does not preclude SARS-Cov-2 infection and should not be used as the sole basis for treatment or other patient management decisions. A negative result may occur with  improper specimen collection/handling, submission of specimen other than nasopharyngeal swab, presence of viral mutation(s) within the areas targeted by this assay, and inadequate number of viral copies(<138 copies/mL). A negative result must be combined with clinical observations, patient history, and epidemiological information. The expected result is Negative.  Fact Sheet for Patients:  EntrepreneurPulse.com.au  Fact Sheet for Healthcare Providers:  IncredibleEmployment.be  This test is no t yet approved or cleared by the [place] FDA and  has been authorized for detection and/or diagnosis of SARS-CoV-2 by FDA under  an Emergency Use Authorization (EUA). This EUA will remain  in effect (meaning this test can be used) for the duration of the COVID-19 declaration under Section 564(b)(1) of the Act, 21 U.S.C.section 360bbb-3(b)(1), unless the authorization is terminated  or revoked sooner.       Influenza A by PCR NEGATIVE NEGATIVE Final   Influenza B by PCR NEGATIVE NEGATIVE Final    Comment: (NOTE) The Xpert Xpress SARS-CoV-2/FLU/RSV plus assay is intended as an aid in the diagnosis of influenza from Nasopharyngeal swab specimens and should not be used as a sole basis for treatment. Nasal washings and aspirates are unacceptable for Xpert Xpress SARS-CoV-2/FLU/RSV testing.  Fact Sheet for Patients: EntrepreneurPulse.com.au  Fact Sheet for Healthcare Providers: IncredibleEmployment.be  This test is not yet approved or cleared by the [place] FDA and has been authorized for detection and/or diagnosis of SARS-CoV-2 by FDA under an Emergency Use Authorization (EUA). This EUA will remain in effect (meaning this test can be used) for the duration of the COVID-19 declaration under Section 564(b)(1) of the Act, 21 U.S.C. section 360bbb-3(b)(1), unless the authorization is terminated or revoked.  Performed at [facility], [place] [address], [place], [place] [address]   Blood culture (routine single)     Status: None (Preliminary result)   Collection Time: 07/11/20 10:50 AM   Specimen: BLOOD  Result Value Ref Range Status   Specimen Description BLOOD SITE NOT SPECIFIED  Final   Special Requests   Final    BOTTLES DRAWN AEROBIC AND ANAEROBIC Blood Culture results may not be optimal due to an inadequate volume of blood received in culture bottles   Culture   Final    NO GROWTH 3 DAYS Performed at [facility], [place] [address] [place], [place] [address]    Report Status PENDING  Incomplete  Culture, blood (Routine X 2) w Reflex to ID Panel      Status: None (Preliminary result)   Collection Time: 07/11/20 11:04 AM   Specimen: BLOOD  Result Value Ref Range Status   Specimen Description BLOOD SITE NOT SPECIFIED  Final   Special Requests   Final    BOTTLES DRAWN AEROBIC AND ANAEROBIC Blood Culture results may not be optimal due to an inadequate volume of blood received in culture bottles   Culture   Final    NO GROWTH 3 DAYS Performed at [facility], [place] [address] [place], [place] [address]    Report Status PENDING  Incomplete  Culture, blood (single)     Status: None (Preliminary result)   Collection Time: 07/11/20  2:36 PM   Specimen: BLOOD  Result Value Ref Range Status   Specimen Description BLOOD  LEFT ANTECUBITAL  Final   Special Requests   Final    BOTTLES DRAWN AEROBIC AND ANAEROBIC Blood Culture results may not be optimal due to an inadequate volume of blood received in culture bottles   Culture   Final    NO GROWTH 3 DAYS Performed at [facility], [place] [address], [place], [place] [address]    Report Status PENDING  Incomplete    Procedures/Studies: DG Chest Portable 1 View  Result Date: 07/11/2020 CLINICAL DATA:  Patient with decreased oxygen saturation. EXAM: PORTABLE CHEST 1 VIEW COMPARISON:  Chest radiograph 12/25/2019. FINDINGS: Monitoring leads overlie the patient. Stable enlarged cardiac and mediastinal contours status post median sternotomy. Slight interval increase in interstitial opacities bilaterally. No large pleural effusion or pneumothorax. Thoracic spine degenerative changes. IMPRESSION: Mild interval increase in interstitial opacities suggestive of edema. Electronically Signed   By: Lovey Newcomer M.D.   On: 07/11/2020 08:05   ECHOCARDIOGRAM COMPLETE  Result Date: 07/12/2020    ECHOCARDIOGRAM REPORT   Patient Name:   AALANI AIKENS Date of Exam: 07/12/2020 Medical Rec #:  [id number]          Height:       61.0 in Accession #:    [id number]         Weight:       229.1 lb Date of Birth:   1955-11-17          BSA:          2.001 m Patient Age:    3 years           BP:           160/74 mmHg Patient Gender: F                  HR:           85 bpm. Exam Location:  Inpatient Procedure: 2D Echo, Cardiac Doppler, Color Doppler and Intracardiac            Opacification Agent Indications:    Abnormal ECG 794.31 / R94.31  History:        Patient has prior history of Echocardiogram examinations, most                 recent 12/25/2019. Previous Myocardial Infarction and CAD, Prior                 CABG, Stroke and COPD, Aortic Valve Disease,                 Signs/Symptoms:Chest Pain; Risk Factors:Hypertension, Diabetes,                 Dyslipidemia and Former Smoker. GERD.  Sonographer:    Vickie Epley RDCS Referring Phys: [id number] RONDELL A SMITH IMPRESSIONS  1. There is no left ventricular thrombus with Definity contrast. 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 Left ventricular ejection fraction, by estimation, is 40 to 45%. The left ventricle has mildly decreased function. The left ventricle demonstrates regional wall motion abnormalities (see scoring diagram/findings for description). There is moderate concentric left ventricular hypertrophy. Left ventricular diastolic parameters are consistent with Grade II diastolic dysfunction (pseudonormalization). Elevated left atrial pressure. There is moderate hypokinesis of the left ventricular, mid-apical anterior wall and anteroseptal wall. There is mild dyskinesis of the left ventricular, entire apical segment.  2. Right ventricular systolic function is normal. The right ventricular size is normal. Tricuspid regurgitation signal is inadequate for assessing PA pressure.  3. Left atrial size  was moderately dilated.  4. The mitral valve is normal in structure. Mild mitral valve regurgitation. Moderate mitral annular calcification.  5. The aortic valve is bicuspid. Aortic valve regurgitation is not visualized. Mild aortic valve sclerosis is present, with no evidence of aortic  valve stenosis.  6. The inferior vena cava is normal in size with greater than 50% respiratory variability, suggesting right atrial pressure of 3 mmHg. Comparison(s): Prior images reviewed side by side. Similar left ventricular wall motion abnormalities and EF were probably present on the previous study, but not well identified without the use of Definity contrast. FINDINGS  Left Ventricle: There is no left ventricular thrombus with Definity contrast. 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 Left ventricular ejection fraction, by estimation, is 40 to 45%. The left ventricle has mildly decreased function. The left ventricle demonstrates regional wall motion abnormalities. Moderate hypokinesis of the left ventricular, mid-apical anterior wall and anteroseptal wall. Mild dyskinesis of the left ventricular, entire apical segment. Definity contrast agent was given IV to delineate the left ventricular endocardial borders. The left ventricular internal cavity size was normal in size. There is moderate concentric left ventricular hypertrophy. Left ventricular diastolic parameters are consistent with Grade II diastolic dysfunction (pseudonormalization). Elevated left atrial pressure.  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 The mid and distal anterior wall, mid and distal anterior septum, and apical inferior segment are hypokinetic. The entire lateral wall, inferior wall, basal anteroseptal segment, mid inferoseptal segment, basal anterior segment, and basal inferoseptal segment are normal. Right Ventricle: The right ventricular size is normal. No increase in right ventricular wall thickness. Right ventricular systolic function is normal. Tricuspid regurgitation signal is inadequate for assessing PA pressure. Left Atrium: Left atrial size was moderately dilated. Right Atrium: Right atrial size was normal in size. Pericardium: There is no evidence of pericardial effusion. Mitral Valve: The mitral valve is normal in structure.  Moderate mitral annular calcification. Mild mitral valve regurgitation. Tricuspid Valve: The tricuspid valve is normal in structure. Tricuspid valve regurgitation is not demonstrated. Aortic Valve: The right and left aortic cusps are fused. The aortic valve is bicuspid. Aortic valve regurgitation is not visualized. Mild aortic valve sclerosis is present, with no evidence of aortic valve stenosis. Aortic valve mean gradient measures 11.0 mmHg. Aortic valve peak gradient measures 22.3 mmHg. Aortic valve area, by VTI measures 2.05 cm. Pulmonic Valve: The pulmonic valve was grossly normal. Pulmonic valve regurgitation is not visualized. Aorta: The aortic root and ascending aorta are structurally normal, with no evidence of dilitation. Venous: The inferior vena cava is normal in size with greater than 50% respiratory variability, suggesting right atrial pressure of 3 mmHg. IAS/Shunts: No atrial level shunt detected by color flow Doppler.  LEFT VENTRICLE PLAX 2D LVIDd:         3.60 cm      Diastology LVIDs:         2.20 cm      LV e' medial:    2.89 cm/s LV PW:         1.60 cm      LV E/e' medial:  58.8 LV IVS:        1.40 cm      LV e' lateral:   5.22 cm/s LVOT diam:     2.20 cm      LV E/e' lateral: 32.6 LV SV:         91 LV SV Index:   45 LVOT Area:     3.80 cm  LV Volumes (  MOD) LV vol d, MOD A2C: 89.2 ml LV vol d, MOD A4C: 117.0 ml LV vol s, MOD A2C: 57.2 ml LV vol s, MOD A4C: 72.9 ml LV SV MOD A2C:     32.0 ml LV SV MOD A4C:     117.0 ml LV SV MOD BP:      40.7 ml RIGHT VENTRICLE RV S prime:     10.10 cm/s LEFT ATRIUM             Index       RIGHT ATRIUM           Index LA diam:        4.70 cm 2.35 cm/m  RA Area:     16.60 cm LA Vol (A2C):   46.0 ml 22.99 ml/m RA Volume:   43.00 ml  21.49 ml/m LA Vol (A4C):   55.8 ml 27.89 ml/m LA Biplane Vol: 51.7 ml 25.84 ml/m  AORTIC VALVE AV Area (Vmax):    1.98 cm AV Area (Vmean):   2.07 cm AV Area (VTI):     2.05 cm AV Vmax:           236.00 cm/s AV Vmean:           149.000 cm/s AV VTI:            0.444 m AV Peak Grad:      22.3 mmHg AV Mean Grad:      11.0 mmHg LVOT Vmax:         123.00 cm/s LVOT Vmean:        81.100 cm/s LVOT VTI:          0.239 m LVOT/AV VTI ratio: 0.54  AORTA Ao Root diam: 3.00 cm MITRAL VALVE MV Area (PHT): 4.68 cm     SHUNTS MV Decel Time: 162 msec     Systemic VTI:  0.24 m MR Peak grad: 88.7 mmHg     Systemic Diam: 2.20 cm MR Vmax:      471.00 cm/s MV E velocity: 170.00 cm/s MV A velocity: 107.00 cm/s MV E/A ratio:  1.59 Mihai Croitoru MD Electronically signed by Sanda Klein MD Signature Date/Time: 07/12/2020/10:02:16 AM    Final      Time coordinating discharge: Over 30 minutes    Dwyane Dee, MD  [facility] 07/14/2020, 1:43 PM

## 2020-07-14 NOTE — Discharge Instructions (Signed)
Patient should NOT be around smoking as this is severely detrimental to her health as well as an extreme fire hazard with ongoing active Oxygen use of 3L continuously.

## 2020-07-16 ENCOUNTER — Telehealth: Payer: Self-pay

## 2020-07-16 LAB — CULTURE, BLOOD (ROUTINE X 2): Culture: NO GROWTH

## 2020-07-16 LAB — CULTURE, BLOOD (SINGLE)
Culture: NO GROWTH
Culture: NO GROWTH

## 2020-07-16 NOTE — Telephone Encounter (Cosign Needed)
Transition Care Management Follow-up Telephone Call  Date of discharge and from where: 07/14/2020 from [facility]  How have you been since you were released from the hospital? Feel tired due to CHF/Pneumonia  Any questions or concerns? No  Items Reviewed:  Did the pt receive and understand the discharge instructions provided? Yes   Medications obtained and verified? Yes   Other? No   Any new allergies since your discharge? No   Dietary orders reviewed? Yes (Low sodium heart healthy diet) Do you have support at home? Yes   (family support)  Home Care and Equipment/Supplies: Were home health services ordered? no If so, what is the name of the agency? n/a  Has the agency set up a time to come to the patient's home? not applicable Were any new equipment or medical supplies ordered?  No What is the name of the medical supply agency? n/a Were you able to get the supplies/equipment? not applicable Do you have any questions related to the use of the equipment or supplies? No  Functional Questionnaire: (I = Independent and D = Dependent) ADLs: I  Bathing/Dressing- I  Meal Prep- I  Eating- I  Maintaining continence- I  Transferring/Ambulation- I  Managing Meds- I  Follow up appointments reviewed:   PCP Hospital f/u appt confirmed? Yes  Scheduled to see Scarlette Calico  on 07/30/2020 @ 4:00 pm.  [facility] f/u appt confirmed? Yes  Scheduled to see Alger Simons, MD on 07/24/2020 @ 2:40 pm.  Are transportation arrangements needed? No   If their condition worsens, is the pt aware to call PCP or go to the Emergency Dept.? Yes  Was the patient provided with contact information for the PCP's office or ED? Yes  Was to pt encouraged to call back with questions or concerns? Yes

## 2020-07-17 ENCOUNTER — Other Ambulatory Visit (HOSPITAL_COMMUNITY): Payer: Self-pay | Admitting: Nurse Practitioner

## 2020-07-22 ENCOUNTER — Telehealth: Payer: Self-pay | Admitting: Physical Medicine & Rehabilitation

## 2020-07-22 NOTE — Telephone Encounter (Signed)
Katherine [name] called the office requesting a phone visit for 07/24/20. Please advise.

## 2020-07-23 NOTE — Telephone Encounter (Signed)
That would be ok.

## 2020-07-24 ENCOUNTER — Other Ambulatory Visit: Payer: Self-pay

## 2020-07-24 ENCOUNTER — Encounter: Payer: Self-pay | Admitting: Physical Medicine & Rehabilitation

## 2020-07-24 ENCOUNTER — Encounter: Payer: Medicare Other | Attending: Physical Medicine & Rehabilitation | Admitting: Physical Medicine & Rehabilitation

## 2020-07-24 VITALS — Ht 61.0 in | Wt 223.0 lb

## 2020-07-24 DIAGNOSIS — M25551 Pain in right hip: Secondary | ICD-10-CM

## 2020-07-24 DIAGNOSIS — K219 Gastro-esophageal reflux disease without esophagitis: Secondary | ICD-10-CM | POA: Diagnosis not present

## 2020-07-24 DIAGNOSIS — M5416 Radiculopathy, lumbar region: Secondary | ICD-10-CM | POA: Diagnosis not present

## 2020-07-24 DIAGNOSIS — E1142 Type 2 diabetes mellitus with diabetic polyneuropathy: Secondary | ICD-10-CM

## 2020-07-24 DIAGNOSIS — R42 Dizziness and giddiness: Secondary | ICD-10-CM | POA: Diagnosis not present

## 2020-07-24 DIAGNOSIS — N186 End stage renal disease: Secondary | ICD-10-CM | POA: Diagnosis not present

## 2020-07-24 DIAGNOSIS — E1122 Type 2 diabetes mellitus with diabetic chronic kidney disease: Secondary | ICD-10-CM | POA: Diagnosis not present

## 2020-07-24 DIAGNOSIS — M069 Rheumatoid arthritis, unspecified: Secondary | ICD-10-CM | POA: Diagnosis not present

## 2020-07-24 DIAGNOSIS — F32A Depression, unspecified: Secondary | ICD-10-CM

## 2020-07-24 DIAGNOSIS — M25552 Pain in left hip: Secondary | ICD-10-CM

## 2020-07-24 DIAGNOSIS — M797 Fibromyalgia: Secondary | ICD-10-CM | POA: Insufficient documentation

## 2020-07-24 DIAGNOSIS — M1711 Unilateral primary osteoarthritis, right knee: Secondary | ICD-10-CM | POA: Insufficient documentation

## 2020-07-24 DIAGNOSIS — M4726 Other spondylosis with radiculopathy, lumbar region: Secondary | ICD-10-CM | POA: Insufficient documentation

## 2020-07-24 DIAGNOSIS — M75101 Unspecified rotator cuff tear or rupture of right shoulder, not specified as traumatic: Secondary | ICD-10-CM

## 2020-07-24 MED ORDER — OXYCODONE HCL 10 MG PO TABS: 10.0000 mg | ORAL_TABLET | Freq: Three times a day (TID) | ORAL | 0 refills | Status: DC | PRN

## 2020-07-24 NOTE — Progress Notes (Signed)
Subjective:    Patient ID: Katherine [name], female    DOB: September 05, 1955, 64 y.o.   MRN: [id number]  HPI   The patient has consented to a tele-health/telephone encounter with [facility]. The patient is at home and the provider is at the office. Two separate patient identifiers were utilized to confirm the identity of the patient.    She's not feeling too well today as she "over did it" yesterday and is more sore today.   She continues to deal with CHF and recently had a small pneumonia which don't help her energy level either. She had to be diuresed and was given a brief course of abx.   Her roommate stole some of her oxycodones and Katherine [name] had to go without for a few days. She kicked the roommate out as a result. Her oxycodone continues to provide relief for her pain.   Her hamstring muscles have been tight and causing pain, and she started taking her flexeril more frequently  Pain Inventory Average Pain varies Pain Right Now 7 My pain is constant, sharp, burning, dull, stabbing, tingling and aching  In the last 24 hours, has pain interfered with the following? General activity 10 Relation with others 7 Enjoyment of life 8 What TIME of day is your pain at its worst? varies Sleep (in general) Fair  Pain is worse with: walking, bending, sitting, inactivity, standing and some activites Pain improves with: rest, therapy/exercise and medication Relief from Meds: 8  Family History  Problem Relation Age of Onset  . Cancer Mother        liver  . Heart disease Father   . Cancer Father        colon  . Arrhythmia Brother        Atrial Fibrillation  . Arrhythmia Paternal Aunt        Atrial Fibrillation   Social History   Socioeconomic History  . Marital status: Widowed    Spouse name: Not on file  . Number of children: Not on file  . Years of education: Not on file  . Highest education level: Not on file  Occupational History  . Not on  file  Tobacco Use  . Smoking status: Former Smoker    Packs/day: 1.00    Years: 30.00    Pack years: 30.00    Types: Cigarettes    Quit date: 08/17/2002    Years since quitting: 17.9  . Smokeless tobacco: Never Used  Vaping Use  . Vaping Use: Never used  Substance and Sexual Activity  . Alcohol use: No  . Drug use: No  . Sexual activity: Not on file  Other Topics Concern  . Not on file  Social History Narrative   She is currently married, and the caregiver of her husband who is recovering from surgery for tongue cancer now diagnosed with lung cancer. Prior to his diagnosis of her husband, she actually had adopted a 79-year-old child who she knows caring for as well. With all the surgeries, they have been quite financially troubled. Thanks the help of her community and church, they have been able to stay "alfoat."     She is a former smoker who quit in 2004 after a 30-pack-year history.   She is active chasing a 23-year-old child, does not do routine exercise. She's been quite depressed with the condition of her husband, and admits to eating comfort herself.   She does not drink alcohol.   Social Determinants  of Health   Financial Resource Strain:   . Difficulty of Paying Living Expenses: Not on file  Food Insecurity: No Food Insecurity  . Worried About Charity fundraiser in the Last Year: Never true  . Ran Out of Food in the Last Year: Never true  Transportation Needs: No Transportation Needs  . Lack of Transportation (Medical): No  . Lack of Transportation (Non-Medical): No  Physical Activity:   . Days of Exercise per Week: Not on file  . Minutes of Exercise per Session: Not on file  Stress:   . Feeling of Stress : Not on file  Social Connections:   . Frequency of Communication with Friends and Family: Not on file  . Frequency of Social Gatherings with Friends and Family: Not on file  . Attends Religious Services: Not on file  . Active Member of Clubs or Organizations: Not on  file  . Attends Archivist Meetings: Not on file  . Marital Status: Not on file   Past Surgical History:  Procedure Laterality Date  . ABDOMINAL AORTOGRAM N/A 04/21/2018   Procedure: ABDOMINAL AORTOGRAM;  Surgeon: [name], MD;  Location: [place] [facility];  Service: Cardiovascular;  Laterality: N/A;  . CATHETER REMOVAL    . CHOLECYSTECTOMY N/A 10/29/2014   Procedure: LAPAROSCOPIC CHOLECYSTECTOMY WITH INTRAOPERATIVE CHOLANGIOGRAM;  Surgeon: Excell Seltzer, MD;  Location: [facility];  Service: General;  Laterality: N/A;  . CORONARY ANGIOPLASTY  1994   x5  . CORONARY ARTERY BYPASS GRAFT  1995   LIMA-LAD, SVG-RPDA, SVG-D1  . ESOPHAGOGASTRODUODENOSCOPY N/A 10/15/2016   Procedure: ESOPHAGOGASTRODUODENOSCOPY (EGD);  Surgeon: Wilford Corner, MD;  [facility];  Service: Endoscopy;  Laterality: N/A;  . I & D EXTREMITY Right 01/29/2018   Procedure: IRRIGATION AND DEBRIDEMENT THUMB;  Surgeon: Dayna Barker, MD;  Location: [place];  Service: Plastics;  Laterality: Right;  . INCISE AND DRAIN ABCESS    . KIDNEY TRANSPLANT  1991  . KNEE ARTHROSCOPY WITH LATERAL MENISECTOMY Left 12/03/2017   Procedure: LEFT KNEE ARTHROSCOPY WITH LATERAL MENISECTOMY;  Surgeon: Earlie Server, MD;  Location: [place];  Service: Orthopedics;  Laterality: Left;  . LAPAROSCOPIC GASTRIC BANDING  04/2004; 10/'09, 2/'10   Port Replacement x 2  . LEFT HEART CATH AND CORS/GRAFTS ANGIOGRAPHY N/A 04/21/2018   Procedure: LEFT HEART CATH AND CORS/GRAFTS ANGIOGRAPHY;  Surgeon: [name], MD;  Location: [place] [facility];  Ost-Prox LAD 50% - proxLAD (pre & post D1) 100% CTO. Cx - patent, small OM1 (stable ~ ostial OM1 90%, too small for PCI) & 2 small LPL; Ost-distal RCA 100% CTO.  LIMA-LAD (not injected); SVG-dRCA patent, SVG-D1 - insertion stent ~20% ISR - Severe R CFA disease w/ focal Sub TO  . LEFT HEART CATH AND CORS/GRAFTS ANGIOGRAPHY  5/'01, 3/'02, 8/'03, 10/'04; 1/'15   08/22/2013: LAD & RCA 100%;  LIMA-LAD & SVG-rPDA patent; Cx-- OM1 60%, OM2 ostial ~50%; SVG-D1 - 80% mid, 50% distal ISR --PCI  . LEFT HEART CATHETERIZATION WITH CORONARY/GRAFT ANGIOGRAM N/A 08/23/2013   Procedure: LEFT HEART CATHETERIZATION WITH Beatrix Fetters;  Surgeon: Wellington Hampshire, MD;  Location: [place] CATH LAB;  Service: Cardiovascular;  Laterality: N/A;  . Lower Extremity Arterial Dopplers  08/2013   ABI: R 0.96, L 1.04  . MULTIPLE TOOTH EXTRACTIONS  age 38  . NM MYOVIEW LTD  03/2016   EF 62%. LOW RISK. C/W prior MI - no Ischemia. Apical hypokinesis.  [name] Kitchen PERCUTANEOUS CORONARY STENT INTERVENTION (PCI-S)  5/'01, 3/'02, 8/'03,  10/'04;   '01 - S660 BMS 2.5 x 9 - dSVG-D1 into D1; '02- post-stent stenosis - 2.5 x 8 Pixel BMS; '8\03: ISR/Thrombosis into native D1 - AngioJet, 2.5 x 13 Pixel; '04 - ISR 95% - covered stented area with Taxus DES 2.5 mm x 20 (2.88)  . PERCUTANEOUS CORONARY STENT INTERVENTION (PCI-S)  08/23/2013   Procedure: PERCUTANEOUS CORONARY STENT INTERVENTION (PCI-S);  Surgeon: Wellington Hampshire, MD;  Location: [facility] CATH LAB;  Service: Cardiovascular;;mid SVG-D1 80%; distal stent ~50% ISR; Promus Prermier DES 2.75 mm xc 20 mm (2.8 mm)  . PORT-A-CATH REMOVAL     kidney  . TRANSTHORACIC ECHOCARDIOGRAM  07/2019   EF 55 to 60%.  No LVH.  Paradoxical septal wall motion-post CABG.  GRII DD.  Normal RV size and function.  Mild bilateral atrial dilation.  Moderate MAC.  Trace MR.  Mild aortic stenosis (gradients: Mean 14.3 mmHg -peak 24.9 mmHg).  . TUBAL LIGATION    . wrist fistula repair Left    dialysis for one year   Past Surgical History:  Procedure Laterality Date  . ABDOMINAL AORTOGRAM N/A 04/21/2018   Procedure: ABDOMINAL AORTOGRAM;  Surgeon: [name], MD;  Location: [place] [facility];  Service: Cardiovascular;  Laterality: N/A;  . CATHETER REMOVAL    . CHOLECYSTECTOMY N/A 10/29/2014   Procedure: LAPAROSCOPIC CHOLECYSTECTOMY WITH INTRAOPERATIVE CHOLANGIOGRAM;  Surgeon: Excell Seltzer, MD;   [facility]: [facility];  Service: General;  Laterality: N/A;  . CORONARY ANGIOPLASTY  1994   x5  . CORONARY ARTERY BYPASS GRAFT  1995   LIMA-LAD, SVG-RPDA, SVG-D1  . ESOPHAGOGASTRODUODENOSCOPY N/A 10/15/2016   Procedure: ESOPHAGOGASTRODUODENOSCOPY (EGD);  Surgeon: Wilford Corner, MD;  Location: [facility] ENDOSCOPY;  Service: Endoscopy;  Laterality: N/A;  . I & D EXTREMITY Right 01/29/2018   Procedure: IRRIGATION AND DEBRIDEMENT THUMB;  Surgeon: Dayna Barker, MD;  Location: [place];  Service: Plastics;  Laterality: Right;  . INCISE AND DRAIN ABCESS    . KIDNEY TRANSPLANT  1991  . KNEE ARTHROSCOPY WITH LATERAL MENISECTOMY Left 12/03/2017   Procedure: LEFT KNEE ARTHROSCOPY WITH LATERAL MENISECTOMY;  Surgeon: Earlie Server, MD;  Location: [place];  Service: Orthopedics;  Laterality: Left;  . LAPAROSCOPIC GASTRIC BANDING  04/2004; 10/'09, 2/'10   Port Replacement x 2  . LEFT HEART CATH AND CORS/GRAFTS ANGIOGRAPHY N/A 04/21/2018   Procedure: LEFT HEART CATH AND CORS/GRAFTS ANGIOGRAPHY;  Surgeon: [name], MD;  Location: [place] [facility];  Ost-Prox LAD 50% - proxLAD (pre & post D1) 100% CTO. Cx - patent, small OM1 (stable ~ ostial OM1 90%, too small for PCI) & 2 small LPL; Ost-distal RCA 100% CTO.  LIMA-LAD (not injected); SVG-dRCA patent, SVG-D1 - insertion stent ~20% ISR - Severe R CFA disease w/ focal Sub TO  . LEFT HEART CATH AND CORS/GRAFTS ANGIOGRAPHY  5/'01, 3/'02, 8/'03, 10/'04; 1/'15   08/22/2013: LAD & RCA 100%; LIMA-LAD & SVG-rPDA patent; Cx-- OM1 60%, OM2 ostial ~50%; SVG-D1 - 80% mid, 50% distal ISR --PCI  . LEFT HEART CATHETERIZATION WITH CORONARY/GRAFT ANGIOGRAM N/A 08/23/2013   Procedure: LEFT HEART CATHETERIZATION WITH Beatrix Fetters;  Surgeon: Wellington Hampshire, MD;  Location: [place] CATH LAB;  Service: Cardiovascular;  Laterality: N/A;  . Lower Extremity Arterial Dopplers  08/2013   ABI: R 0.96, L 1.04  . MULTIPLE TOOTH EXTRACTIONS  age 81  . NM MYOVIEW LTD  03/2016   EF 62%. LOW RISK.  C/W prior MI - no Ischemia. Apical hypokinesis.  [name] Kitchen PERCUTANEOUS CORONARY STENT  INTERVENTION (PCI-S)  5/'01, 3/'02, 8/'03, 10/'04;   '01 - S660 BMS 2.5 x 9 - dSVG-D1 into D1; '02- post-stent stenosis - 2.5 x 8 Pixel BMS; '8\03: ISR/Thrombosis into native D1 - AngioJet, 2.5 x 13 Pixel; '04 - ISR 95% - covered stented area with Taxus DES 2.5 mm x 20 (2.88)  . PERCUTANEOUS CORONARY STENT INTERVENTION (PCI-S)  08/23/2013   Procedure: PERCUTANEOUS CORONARY STENT INTERVENTION (PCI-S);  Surgeon: Wellington Hampshire, MD;  Location: [facility] CATH LAB;  Service: Cardiovascular;;mid SVG-D1 80%; distal stent ~50% ISR; Promus Prermier DES 2.75 mm xc 20 mm (2.8 mm)  . PORT-A-CATH REMOVAL     kidney  . TRANSTHORACIC ECHOCARDIOGRAM  07/2019   EF 55 to 60%.  No LVH.  Paradoxical septal wall motion-post CABG.  GRII DD.  Normal RV size and function.  Mild bilateral atrial dilation.  Moderate MAC.  Trace MR.  Mild aortic stenosis (gradients: Mean 14.3 mmHg -peak 24.9 mmHg).  . TUBAL LIGATION    . wrist fistula repair Left    dialysis for one year   Past Medical History:  Diagnosis Date  . Anemia   . Anxiety   . Bilateral carotid artery stenosis    Carotid duplex 02/3427: 7-68% LICA, 11-57% RICA, >26% RECA, f/u 1 yr suggested  . CAD (coronary artery disease) of bypass graft 5/01; 3/'02, 8/'03, 10/'04; 1/15   PCI x 5 to SVG-D1   . CAD in native artery 07/1993   3 Vessel Disease (LAD-D1 & RCA) -- CABG (Dx in setting of inferior STEMI-PTCA of RCA)  . CAD S/P percutaneous coronary angioplasty    PCI to SVG-D1 insertion/native D1 x 4 = '01 -(S660 BMS 2.5 x 9 anastomosis- D1); '02 - distal overlap ACS Pixel 2.5 x 8  BMS; '03 distal/native ISR/Thrombosis - Pixel 2.5 x 13; '04 - ISR-  Taxus 2.5 x 20 (covered all);; 1/15 - mid SVG-D1 (50% distal ISR) - Promus P 2.75 x 20 -- 2.8 mm  . COPD mixed type ([place])    Followed by Dr. Lamonte Sakai "pulmonologist said no COPD"  . Depression   . Depression with anxiety   . Diabetes mellitus type 2  in obese ([place])   . Diarrhea    started after cholecystectomy and mass removed from intestine  . Dyslipidemia, goal LDL below 70    08/2012: TC 137, TG 200, HDL 32!, LDL 45; on statin (followed by Dr.Deterding)  . ESRD (end stage renal disease) ([place]) 1991   s/p Cadaveric Renal Transplant [facility] - Dr. Jimmy Footman)   . Family history of adverse reaction to anesthesia    mom's bp dropped during/after anesthesia  . Fibromyalgia   . GERD (gastroesophageal reflux disease)   . Glomerulonephritis, chronic, rapidly progressive 24  . 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 07/1993   Rescue PTCA of RCA -- referred for CABG.  . H/O: GI bleed   . Headache    migraines in the past  . History of CABG x 3 08/1993   Dr. Servando Snare: LIMA-LAD, SVG-bifurcatingD1, SVG-rPDA  . History of kidney stones   . History of stroke 2012   "right eye stroke- half blind now"  . Hypertension associated with diabetes ([place])   . Mild aortic stenosis by prior echocardiogram 07/2019   Echo:  Mild aortic stenosis (gradients: Mean 14.3 mmHg -peak 24.9 mmHg).  . Morbid obesity ([place])   . MRSA (methicillin resistant staph aureus) culture positive   . OSA (obstructive sleep apnea)  no longer on CPAP or home O2, states she doesn't need now after lap band  . PAD (peripheral artery disease) ([place]) 08/2013   LEA Dopplers to be read by Dr. Fletcher Anon  . PAF (paroxysmal atrial fibrillation) ([place]) 06/2014   Noted on CardioNet Monitor  - --> rhythm control with Tikosyn (Dr. Rayann Heman); converted from warfarin to apixaban for anticoagulation.  . Pneumonia   . Recurrent boils    Bilateral Groin  . Rheumatoid arthritis ([place])    Per Patient Report; associated with OA  . S/p cadaver renal transplant [place]  . Unstable angina ([place]) 5/01; 3/'02, 8/'03, 10/'04; 1/15   x 5 occurences since Inf-Post STEMI in 1994   Ht _0  (1.549 m)   Wt 223 lb (101.2 kg)   BMI 42.14 kg/m   Opioid Risk Score:   Fall Risk  Score:  `1  Depression screen PHQ 2/9  Depression screen [facility] 2/9 01/04/2020 11/02/2018 07/04/2018  Decreased Interest 0 3 3  Down, Depressed, Hopeless _1 PHQ - 2 Score _2 Altered sleeping - 1 3  Tired, decreased energy - 1 3  Change in appetite - 1 2  Feeling bad or failure about yourself  - 0 3  Trouble concentrating - 0 2  Moving slowly or fidgety/restless - 0 0  Suicidal thoughts - 0 0  PHQ-9 Score - 9 19  Difficult doing work/chores - - Very difficult  Some recent data might be hidden    Review of Systems  Constitutional: Negative.   HENT: Negative.   Eyes: Negative.   Respiratory: Negative.   Gastrointestinal: Negative.   Endocrine: Negative.   Genitourinary: Negative.   Musculoskeletal: Positive for arthralgias, back pain, myalgias, neck pain and neck stiffness.  Skin: Negative.   Allergic/Immunologic: Negative.   Neurological: Positive for weakness and numbness.       Tingling  Hematological: Negative.   Psychiatric/Behavioral: Positive for dysphoric mood. The patient is nervous/anxious.   All other systems reviewed and are negative.      Objective:   Physical Exam        Assessment & Plan:  1. Fibromyalgia:               - Continue Tegretol and Cymbalta (74m)              -maintain HEP as much as possible  -discussed hamstring stretching today. Needs to be more aggressive with this 2. Rheumatoid Arthritis: Refilled: Oxycodone 137mone tablet every 8 hours as needed for severe pain #75.   -she'll call for rx next month  -We will continue the controlled substance monitoring program, this consists of regular clinic visits, examinations, routine drug screening, pill counts [place] well [place].  3. Osteoarthritis--polyarticular:               4. ESRD with hx of renal transplant on chronic steroids:              -Nephrology following  -volume mgt per cards also 5. Right  rotator cuff tendonitis/like degenerative/inflammatory changes in the right shoulder also.              -shoulder ROM 6. GERD:             -resume pepcid                7. Depression:              -  continue current meds             -neuropsych eval might be helpful at some point. 8. Lumbar Radiculitis: previous  Left L4-5 Transformanial ESI with relief   9. Bilateral  Hip Pain:              -HEP as previously discussed 10. Type 2 DM with Peripheral Neuropathy             -lamictal 11. Dizziness/fatigue: mulitfactorial related to anemia, FMS, ESRD, mood, etc  -encouraged activity to tolerance  Fifteen minutes of telephone time wasspent during this visit. All questions were encouraged and answered.  Follow up with me in 2 mos .

## 2020-07-30 ENCOUNTER — Inpatient Hospital Stay: Payer: Medicare Other | Admitting: Internal Medicine

## 2020-08-05 ENCOUNTER — Other Ambulatory Visit: Payer: Self-pay

## 2020-08-05 IMAGING — DX DG CHEST 1V PORT
1 series · 1 of 1 positions shown · non-contrast
Comparison: 12/19/2018

CLINICAL DATA: Shortness of breath and generalized weakness for 2
weeks. Chest pain. Nausea and vomiting.

EXAM:
PORTABLE CHEST 1 VIEW

[chest ap]
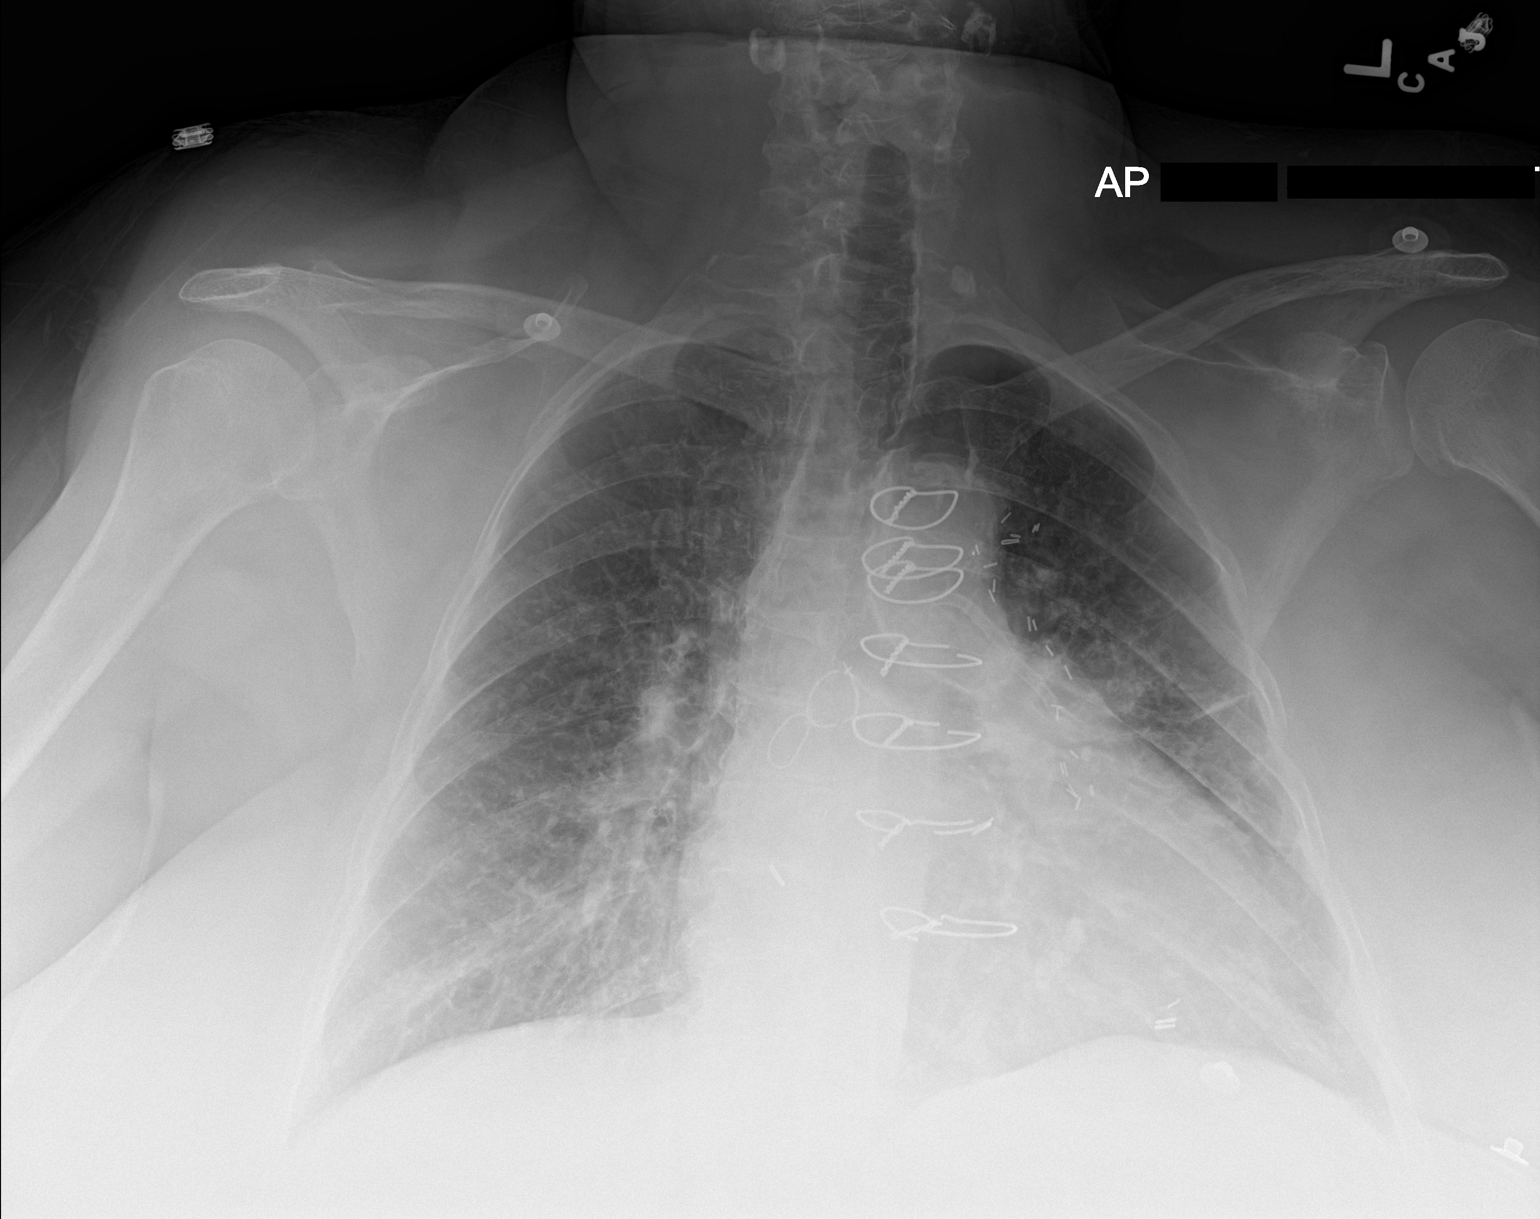

[1 of 1 positions shown; findings below may reference images not displayed]

FINDINGS: Stable mild cardiomegaly and prior CABG. Mild scarring seen in the
left midlung. Chronic pulmonary interstitial prominence noted. No
evidence of acute infiltrate or edema. No evidence of pleural
effusion.
IMPRESSION: Stable mild cardiomegaly. No active lung disease.

## 2020-08-06 ENCOUNTER — Inpatient Hospital Stay: Payer: Medicare Other | Admitting: Internal Medicine

## 2020-08-07 ENCOUNTER — Telehealth: Payer: Self-pay

## 2020-08-07 NOTE — Telephone Encounter (Signed)
Patient called stating her Zoloft prescription says take 2 tablets at bedtime and she states that she remembers that Dr. Naaman Plummer said not to take over 100 mg of Zoloft while taking Cymbalta. Please advise

## 2020-08-08 ENCOUNTER — Ambulatory Visit: Payer: Medicare Other | Admitting: Physical Therapy

## 2020-08-13 ENCOUNTER — Other Ambulatory Visit: Payer: Self-pay

## 2020-08-13 ENCOUNTER — Encounter: Payer: Self-pay | Admitting: Internal Medicine

## 2020-08-13 ENCOUNTER — Ambulatory Visit (INDEPENDENT_AMBULATORY_CARE_PROVIDER_SITE_OTHER): Payer: Medicare Other | Admitting: Internal Medicine

## 2020-08-13 VITALS — BP 140/90 | HR 96 | Temp 98.3°F | Ht 61.0 in | Wt 231.0 lb

## 2020-08-13 DIAGNOSIS — F411 Generalized anxiety disorder: Secondary | ICD-10-CM | POA: Diagnosis not present

## 2020-08-13 DIAGNOSIS — E1159 Type 2 diabetes mellitus with other circulatory complications: Secondary | ICD-10-CM | POA: Diagnosis not present

## 2020-08-13 DIAGNOSIS — D649 Anemia, unspecified: Secondary | ICD-10-CM

## 2020-08-13 DIAGNOSIS — Z23 Encounter for immunization: Secondary | ICD-10-CM | POA: Diagnosis not present

## 2020-08-13 DIAGNOSIS — I152 Hypertension secondary to endocrine disorders: Secondary | ICD-10-CM

## 2020-08-13 DIAGNOSIS — I480 Paroxysmal atrial fibrillation: Secondary | ICD-10-CM

## 2020-08-13 DIAGNOSIS — B372 Candidiasis of skin and nail: Secondary | ICD-10-CM | POA: Diagnosis not present

## 2020-08-13 DIAGNOSIS — E785 Hyperlipidemia, unspecified: Secondary | ICD-10-CM

## 2020-08-13 DIAGNOSIS — D539 Nutritional anemia, unspecified: Secondary | ICD-10-CM

## 2020-08-13 DIAGNOSIS — F32A Anxiety disorder, unspecified: Secondary | ICD-10-CM | POA: Insufficient documentation

## 2020-08-13 DIAGNOSIS — F419 Anxiety disorder, unspecified: Secondary | ICD-10-CM | POA: Insufficient documentation

## 2020-08-13 DIAGNOSIS — E1142 Type 2 diabetes mellitus with diabetic polyneuropathy: Secondary | ICD-10-CM

## 2020-08-13 DIAGNOSIS — Z1231 Encounter for screening mammogram for malignant neoplasm of breast: Secondary | ICD-10-CM | POA: Insufficient documentation

## 2020-08-13 LAB — CBC WITH DIFFERENTIAL/PLATELET
Basophils Absolute: 0.1 10*3/uL (ref 0.0–0.1)
Basophils Relative: 0.7 % (ref 0.0–3.0)
Eosinophils Absolute: 0.1 10*3/uL (ref 0.0–0.7)
Eosinophils Relative: 1.3 % (ref 0.0–5.0)
HCT: 34.4 % — ABNORMAL LOW (ref 36.0–46.0)
Hemoglobin: 11.5 g/dL — ABNORMAL LOW (ref 12.0–15.0)
Lymphocytes Relative: 12.6 % (ref 12.0–46.0)
Lymphs Abs: 1 10*3/uL (ref 0.7–4.0)
MCHC: 33.5 g/dL (ref 30.0–36.0)
MCV: 91.8 fl (ref 78.0–100.0)
Monocytes Absolute: 0.7 10*3/uL (ref 0.1–1.0)
Monocytes Relative: 8.1 % (ref 3.0–12.0)
Neutro Abs: 6.4 10*3/uL (ref 1.4–7.7)
Neutrophils Relative %: 77.3 % — ABNORMAL HIGH (ref 43.0–77.0)
Platelets: 242 10*3/uL (ref 150.0–400.0)
RBC: 3.75 Mil/uL — ABNORMAL LOW (ref 3.87–5.11)
RDW: 16.9 % — ABNORMAL HIGH (ref 11.5–15.5)
WBC: 8.2 10*3/uL (ref 4.0–10.5)

## 2020-08-13 LAB — FOLATE: Folate: 10 ng/mL (ref >5.9)

## 2020-08-13 LAB — MICROALBUMIN / CREATININE URINE RATIO
Creatinine,U: 32.3 mg/dL
Microalb Creat Ratio: 2.2 mg/g (ref 0.0–30.0)
Microalb, Ur: <0.7 mg/dL (ref 0.0–1.9)

## 2020-08-13 LAB — FERRITIN: Ferritin: 64.3 ng/mL (ref 10.0–291.0)

## 2020-08-13 LAB — IRON: Iron: 64 ug/dL (ref 42–145)

## 2020-08-13 LAB — TSH: TSH: 0.87 u[IU]/mL (ref 0.35–4.50)

## 2020-08-13 LAB — VITAMIN B12: Vitamin B-12: 403 pg/mL (ref 211–911)

## 2020-08-13 MED ORDER — FLUCONAZOLE 100 MG PO TABS: 100.0000 mg | ORAL_TABLET | Freq: Every day | ORAL | 0 refills | Status: AC

## 2020-08-13 MED ORDER — DOFETILIDE 250 MCG PO CAPS: 250.0000 ug | ORAL_CAPSULE | Freq: Two times a day (BID) | ORAL | 2 refills | Status: DC

## 2020-08-13 MED ORDER — LORAZEPAM 1 MG PO TABS: 1.0000 mg | ORAL_TABLET | Freq: Two times a day (BID) | ORAL | 5 refills | Status: DC

## 2020-08-13 NOTE — Patient Instructions (Signed)
Anemia  Anemia is a condition in which you do not have enough red blood cells or hemoglobin. Hemoglobin is a substance in red blood cells that carries oxygen. When you do not have enough red blood cells or hemoglobin (are anemic), your body cannot get enough oxygen and your organs may not work properly. As a result, you may feel very tired or have other problems. What are the causes? Common causes of anemia include:  Excessive bleeding. Anemia can be caused by excessive bleeding inside or outside the body, including bleeding from the intestine or from periods in women.  Poor nutrition.  Long-lasting (chronic) kidney, thyroid, and liver disease.  Bone marrow disorders.  Cancer and treatments for cancer.  HIV (human immunodeficiency virus) and AIDS (acquired immunodeficiency syndrome).  Treatments for HIV and AIDS.  Spleen problems.  Blood disorders.  Infections, medicines, and autoimmune disorders that destroy red blood cells. What are the signs or symptoms? Symptoms of this condition include:  Minor weakness.  Dizziness.  Headache.  Feeling heartbeats that are irregular or faster than normal (palpitations).  Shortness of breath, especially with exercise.  Paleness.  Cold sensitivity.  Indigestion.  Nausea.  Difficulty sleeping.  Difficulty concentrating. Symptoms may occur suddenly or develop slowly. If your anemia is mild, you may not have symptoms. How is this diagnosed? This condition is diagnosed based on:  Blood tests.  Your medical history.  A physical exam.  Bone marrow biopsy. Your health care provider may also check your stool (feces) for blood and may do additional testing to look for the cause of your bleeding. You may also have other tests, including:  Imaging tests, such as a CT scan or MRI.  Endoscopy.  Colonoscopy. How is this treated? Treatment for this condition depends on the cause. If you continue to lose a lot of blood, you may  need to be treated at a hospital. Treatment may include:  Taking supplements of iron, vitamin S31, or folic acid.  Taking a hormone medicine (erythropoietin) that can help to stimulate red blood cell growth.  Having a blood transfusion. This may be needed if you lose a lot of blood.  Making changes to your diet.  Having surgery to remove your spleen. Follow these instructions at home:  Take over-the-counter and prescription medicines only as told by your health care provider.  Take supplements only as told by your health care provider.  Follow any diet instructions that you were given.  Keep all follow-up visits as told by your health care provider. This is important. Contact a health care provider if:  You develop new bleeding anywhere in the body. Get help right away if:  You are very weak.  You are short of breath.  You have pain in your abdomen or chest.  You are dizzy or feel faint.  You have trouble concentrating.  You have bloody or black, tarry stools.  You vomit repeatedly or you vomit up blood. Summary  Anemia is a condition in which you do not have enough red blood cells or enough of a substance in your red blood cells that carries oxygen (hemoglobin).  Symptoms may occur suddenly or develop slowly.  If your anemia is mild, you may not have symptoms.  This condition is diagnosed with blood tests as well as a medical history and physical exam. Other tests may be needed.  Treatment for this condition depends on the cause of the anemia. This information is not intended to replace advice given to you by  your health care provider. Make sure you discuss any questions you have with your health care provider. Document Revised: 07/16/2017 Document Reviewed: 09/04/2016 Elsevier Patient Education  [place].

## 2020-08-13 NOTE — Progress Notes (Signed)
Subjective:  Patient ID: Katherine [name], female    DOB: 16-Jan-1956  Age: 64 y.o. MRN: [id number]  CC: Anemia, Hyperlipidemia, Diabetes, and Rash  This visit occurred during the SARS-CoV-2 public health emergency.  Safety protocols were in place, including screening questions prior to the visit, additional usage of staff PPE, and extensive cleaning of exam room while observing appropriate contact time as indicated for disinfecting solutions.    HPI Katherine [name] presents for f/up - She was recently admitted for an exacerbation of CHF with respiratory distress and asthma.  Her shortness of breath and lower extremity edema have improved.  She denies any recent episodes of chest pain, diaphoresis, or palpitations.  She complains of a red painful rash under both breast extending into the axillary regions bilaterally.  She has not treated this with anything over-the-counter.  She also complains of anxiety.  She tells me that her nephrologist prescribed Ativan but he has retired and she wants to know if I will prescribe it.   PCP: [name], MD    Admit date: 07/11/2020  Discharge date: 07/14/2020    Admitted From: home  Disposition:  home  Discharging physician: Dwyane Dee, MD    Recommendations for Outpatient Follow-up:   1.Patient encouraged to continue lasix at discharge    Patient discharged to home in Discharge Condition: stable  CODE STATUS: full  Diet recommendation:       Diet Orders (From admission, onward)             Hospital Course:  Katherine [name] is a 64 y.o. female with medical history significant of HTN, HLD, PAF on Eliquis, CAD s/p CABG, RA, ESRD s/p renal transplant on chronic immunosuppressive therapy(Imuran and prednisone $RemoveBefore'5mg'[id number]$  daily), persistent asthma, chronic respiratory failure on 3 L, DM type II, and obesity s/p lap band who presented with SOB.      She had not been feeling well for approximately a week prior to admission.   She has also been having difficulty with breathing in her house due to her roommate smoking inside.  She had associated symptoms of a productive cough which is chronic for her and productive typically of brown/green sputum.  She has had worsened swelling in her legs and increased weight.  She had been off of Lasix for approximately 1 week due to feeling dehydrated. In the ER she was found to be hypoxic (she is on 3 L chronically at home).  She was started on Lasix and began diuresing well.     CXR was also concerning for some interstitial opacities most likely edema but some concern for pneumonia on admission.  She was started on cefepime and azithromycin.  Her infectious work-up was negative and antibiotics were discontinued.  Prior to discharge she did have some noticeable brownish/yellow sputum; she was therefore discharged home with a 5-day course of Augmentin.  She diuresed well and had good improvement with her respiratory symptoms from admission.  She was able to be weaned down to her home setting of 3 L nasal cannula oxygen.  She was continued on Lasix at discharge.         * Acute on chronic respiratory failure with hypoxia (HCC)  -Likely due to volume overload in setting of being off of Lasix for approximately 1 week prior to admission.  With negative procalcitonin, no leukocytosis (understand she is on immunosuppressive therapy), very low-grade fever without persistence, will discontinue antibiotics for now and monitor off  -Continue oxygen, she  has been weaned down to home setting of 3 L  -Given some appearance of her sputum being brown/yellow just prior to discharge, she was given a 5-day course of Augmentin to complete upon returning home     Acute on chronic diastolic heart failure ([place])  -Patient off of Lasix for approximately 1 week when home due to feeling dehydrated.  She exhibited signs of volume overload on admission with elevated BNP, pulmonary edema on CXR,  lower extremity edema, and shortness of breath  -Continue Lasix and monitor output  -Last echo showed EF 55 to 60% with grade 3 diastolic dysfunction  -Continues to diurese well     Asthma exacerbation  - no further wheezing and her SOB is resolved  - continue O2 and inhalers  - reduce prednisone back to home 5 mg daily     Paroxysmal atrial fibrillation (HCC); CHA2DS2VASc score F, HTN, CAD, CVA = 5  Continue Eliquis  -Continue Cardizem, metoprolol, Tikosyn  -Followed outpatient by cardiology     SIRS (systemic inflammatory response syndrome) (HCC)  -No obvious source of infection  -Discontinue antibiotics.  Trend procalcitonin  -Suspect that her clinical presentation more consistent with volume overload and possible asthma exacerbation.  She is nontoxic-appearing and much improved this morning     Normocytic anemia  -Hemoglobin at baseline  -Monitor CBC while in hospital     Type 2 diabetes mellitus with peripheral neuropathy ([place])  -Continue SSI and CBG monitoring     Hypomagnesemia  -Replete and recheck as needed     Morbid obesity - s/p Lap Band 9/'05  - ongoing lifestyle modification as able         The patient's chronic medical conditions were treated accordingly per the patient's home medication regimen except as noted.  On day of discharge, patient was felt deemed stable for discharge. Patient/family member advised to call PCP or come back to ER if needed.      Principal Diagnosis: Acute on chronic respiratory failure with hypoxia [facility])     Discharge Diagnoses:        Active Hospital Problems        Diagnosis   Date Noted    .   Acute on chronic respiratory failure with hypoxia (HCC)   07/11/2020            Priority: High    .   Acute on chronic diastolic heart failure ([place])   12/26/2019            Priority: High    .   Asthma exacerbation   07/23/2019            Priority: Medium     .   Paroxysmal atrial fibrillation (HCC); CHA2DS2VASc score F, HTN, CAD, CVA = 5   06/28/2014            Priority: Medium    .   SIRS (systemic inflammatory response syndrome) ([place])   07/11/2020            Priority: Low    .   Normocytic anemia   10/13/2016    .   Type 2 diabetes mellitus with peripheral neuropathy ([place])   03/30/2016    .   Hypomagnesemia   06/13/2012    .   Morbid obesity - s/p Lap Band 9/'05   05/07/2004            Class: Diagnosis of         Resolved Hospital Problems  No resolved problems to display.               Discharge Instructions          Diet - low sodium heart healthy     Complete by: As directed           Diet Carb Modified     Complete by: As directed           Increase activity slowly     Complete by: As directed                      Allergies as of 07/14/2020                 Reactions         Tetracycline   Hives        Patient tolerated Doxycycline Dec 2020        Niacin   Other (See Comments)        Mouth blisters        Niaspan [niacin Er]   Other (See Comments)        Mouth blisters        Sulfa Antibiotics   Nausea Only, Other (See Comments)        "Tears up stomach"        Sulfonamide Derivatives   Other (See Comments)        Reaction: per patient "tears her stomach up"        Codeine   Nausea And Vomiting        Erythromycin   Nausea And Vomiting        Hydromorphone Hcl   Nausea And Vomiting        Morphine And Related   Nausea And Vomiting        Nalbuphine   Nausea And Vomiting        Nubain        Sulfasalazine   Nausea Only, Other (See Comments)        per patient "tears her stomach up", "Tears up stomach"        Tape   Rash, Other (See Comments)        No "plastic" tape," please                     Medication List          STOP taking these medications      magnesium oxide 400 MG tablet  Commonly known as: MAG-OX              TAKE these medications      acetaminophen 500 MG tablet  Commonly known as: TYLENOL  Take 1,500 mg by mouth daily.       albuterol (2.5 MG/3ML) 0.083% nebulizer solution  Commonly known as: PROVENTIL  Take 3 mLs (2.5 mg total) by nebulization every 6 (six) hours as needed for wheezing or shortness of breath (dx: J45.20).  What changed: Another medication with the same name was changed. Make sure you understand how and when to take each.       albuterol 108 (90 Base) MCG/ACT inhaler  Commonly known as: VENTOLIN HFA  TAKE 2 PUFFS BY MOUTH EVERY 6 HOURS AS NEEDED FOR WHEEZE OR SHORTNESS OF BREATH  What changed: See the new instructions.       amoxicillin-clavulanate 875-125 MG tablet  Commonly known as: Augmentin  Take 1 tablet by mouth every 12 (twelve) hours for  5 days.       arformoterol 15 MCG/2ML Nebu  Commonly known as: BROVANA  Take 2 mLs (15 mcg total) by nebulization 2 (two) times daily.  What changed: additional instructions       azaTHIOprine 50 MG tablet  Commonly known as: IMURAN  Take 125 mg by mouth daily.       B-D ULTRAFINE III SHORT PEN 31G X 8 MM Misc  Generic drug: Insulin Pen Needle  See admin instructions.       Basaglar KwikPen 100 UNIT/ML  Inject 12 Units into the skin daily before breakfast.       budesonide 0.5 MG/2ML nebulizer solution  Commonly known as: PULMICORT  Take 2 mLs (0.5 mg total) by nebulization 2 (two) times daily.  What changed: additional instructions       calcitRIOL 0.25 MCG capsule  Commonly known as: ROCALTROL  Take 0.25 mcg by mouth every 3 (three) days.       clotrimazole 10 MG troche  Commonly known as: MYCELEX  Take 1 tablet (10 mg total) by mouth 5 (five) times daily.  What changed:   .when to take  this   .reasons to take this        cyclobenzaprine 10 MG tablet  Commonly known as: FLEXERIL  TAKE 1 TABLET BY MOUTH 3 TIMES DAILY AS NEEDED FOR MUSCLE SPASMS  What changed: See the new instructions.       diltiazem 120 MG 24 hr capsule  Commonly known as: CARDIZEM CD  TAKE 1 CAPSULE BY MOUTH EVERY DAY  What changed: how much to take       diltiazem 30 MG tablet  Commonly known as: CARDIZEM  TAKE 1 TABLET EVERY 4 HOURS AS NEEDED FOR AFIB HEART RATE >100  What changed: See the new instructions.       dofetilide 250 MCG capsule  Commonly known as: TIKOSYN  TAKE 1 CAPSULE BY MOUTH TWICE A DAY  What changed:   .how much to take   .how to take this   .when to take this        DULoxetine 30 MG capsule  Commonly known as: CYMBALTA  Take 30 mg by mouth daily.       Eliquis 5 MG Tabs tablet  Generic drug: apixaban  TAKE 1 TABLET BY MOUTH TWICE A DAY (TO REPLACE WARFARIN)  What changed: See the new instructions.       ferrous sulfate 325 (65 FE) MG tablet  Take 1 tablet (325 mg total) by mouth daily with breakfast.       fluticasone 50 MCG/ACT nasal spray  Commonly known as: FLONASE  PLACE 2 SPRAYS INTO BOTH NOSTRILS 2 TIMES DAILY  What changed: See the new instructions.       furosemide 40 MG tablet  Commonly known as: LASIX  Take 40 mg by mouth daily.       glimepiride 4 MG tablet  Commonly known as: AMARYL  Take 4 mg by mouth daily.       lamoTRIgine 25 MG tablet  Commonly known as: LAMICTAL  TAKE 1 TABLET BY MOUTH EVERYDAY AT BEDTIME  What changed: See the new instructions.       LORazepam 1 MG tablet  Commonly known as: ATIVAN  Take 1 tablet (1 mg total) by mouth every 8 (eight) hours as needed for anxiety. Takes daily  What changed:   .when to take this   .additional instructions  metFORMIN 500 MG tablet  Commonly known as: GLUCOPHAGE  Take 500 mg by mouth 2 (two)  times daily.       metoprolol tartrate 50 MG tablet  Commonly known as: LOPRESSOR  TAKE 1 TABLET BY MOUTH TWICE A DAY  What changed:   .when to take this   .additional instructions        montelukast 10 MG tablet  Commonly known as: SINGULAIR  TAKE 1 TABLET BY MOUTH EVERY DAY  What changed: when to take this       nitroGLYCERIN 0.4 MG SL tablet  Commonly known as: NITROSTAT  Place 1 tablet (0.4 mg total) under the tongue every 5 (five) minutes as needed for chest pain.       NovoLOG FlexPen 100 UNIT/ML FlexPen  Generic drug: insulin aspart  Inject 10 Units into the skin 2 (two) times daily after a meal.       omeprazole 20 MG capsule  Commonly known as: PRILOSEC  Take 20 mg by mouth daily.       ONE TOUCH ULTRA 2 w/Device Kit  2 (two) times daily.       OneTouch Delica Plus [id number] Misc  daily. as directed       OneTouch Ultra test strip  Generic drug: glucose blood  USE AS DIRECTED 3 TIMES DAILY TO CHECK BLOOD SUGAR       Oxycodone HCl 10 MG Tabs  Take 1 tablet (10 mg total) by mouth every 8 (eight) hours as needed.  What changed: when to take this       OXYGEN  Inhale 3 L into the lungs continuous.       predniSONE 5 MG tablet  Commonly known as: DELTASONE  Take 5 mg by mouth daily.       promethazine 25 MG tablet  Commonly known as: PHENERGAN  Take 1 tablet (25 mg total) by mouth at bedtime as needed for nausea. For nausea and sleep  What changed:   .when to take this   .reasons to take this        rosuvastatin 20 MG tablet  Commonly known as: CRESTOR  Take 1 tablet (20 mg total) by mouth daily.       sertraline 100 MG tablet  Commonly known as: ZOLOFT  Take 100 mg by mouth daily.                        Allergies    Allergen   Reactions    .   Tetracycline   Hives            Patient tolerated Doxycycline Dec 2020    .   Niacin   Other  (See Comments)            Mouth blisters    .   Niaspan [Niacin Er]   Other (See Comments)            Mouth blisters    .   Sulfa Antibiotics   Nausea Only and Other (See Comments)            "Tears up stomach"    .   Sulfonamide Derivatives   Other (See Comments)            Reaction: per patient "tears her stomach up"    .   Codeine   Nausea And Vomiting    .   Erythromycin   Nausea And Vomiting    .  Hydromorphone Hcl   Nausea And Vomiting    .   Morphine And Related   Nausea And Vomiting    .   Nalbuphine   Nausea And Vomiting            Nubain    .   Sulfasalazine   Nausea Only and Other (See Comments)            per patient "tears her stomach up", "Tears up stomach"    .   Tape   Rash and Other (See Comments)            No "plastic" tape," please          Consultations:        Discharge Exam:  BP (!) 150/62 (BP Location: Right Arm)   Pulse (!) 58   Temp 98.1 F (36.7 C) (Oral)   Resp 18   Ht 5\' 1"  (1.549 m)   Wt 102.3 kg   SpO2 94%   BMI 42.63 kg/m   General appearance: alert, cooperative and no distress  Head: Normocephalic, without obvious abnormality, atraumatic  Eyes: EOMI  Lungs: Bibasilar crackles, no wheezing  Heart: irregularly irregular rhythm and S1, S2 normal  Abdomen: normal findings: bowel sounds normal and soft, non-tender  Extremities: 1+ lower extremity edema  Skin: mobility and turgor normal  Neurologic: Grossly normal   Outpatient Medications Prior to Visit  Medication Sig Dispense Refill  . acetaminophen (TYLENOL) 500 MG tablet Take 1,500 mg by mouth daily.    [name] Kitchen albuterol (PROVENTIL) (2.5 MG/3ML) 0.083% nebulizer solution Take 3 mLs (2.5 mg total) by nebulization every 6 (six) hours as needed for wheezing or shortness of breath (dx: J45.20). 75 mL 2  . albuterol (VENTOLIN HFA) 108 (90 Base) MCG/ACT inhaler TAKE 2 PUFFS BY  MOUTH EVERY 6 HOURS AS NEEDED FOR WHEEZE OR SHORTNESS OF BREATH (Patient taking differently: Inhale 2 puffs into the lungs every 6 (six) hours as needed for wheezing or shortness of breath.) 18 g 5  . arformoterol (BROVANA) 15 MCG/2ML NEBU Take 2 mLs (15 mcg total) by nebulization 2 (two) times daily. (Patient taking differently: Take 15 mcg by nebulization 2 (two) times daily. Mix with budesonide) 120 mL 12  . azaTHIOprine (IMURAN) 50 MG tablet Take 125 mg by mouth daily.     . B-D ULTRAFINE III SHORT PEN 31G X 8 MM MISC See admin instructions.    . Blood Glucose Monitoring Suppl (ONE TOUCH ULTRA 2) w/Device KIT 2 (two) times daily.    . budesonide (PULMICORT) 0.5 MG/2ML nebulizer solution Take 2 mLs (0.5 mg total) by nebulization 2 (two) times daily. (Patient taking differently: Take 2 mLs by nebulization 2 (two) times daily. Mix with Brovana) 360 mL 1  . calcitRIOL (ROCALTROL) 0.25 MCG capsule Take 0.25 mcg by mouth every 3 (three) days.     . clotrimazole (MYCELEX) 10 MG troche Take 1 tablet (10 mg total) by mouth 5 (five) times daily. (Patient taking differently: Take 10 mg by mouth 5 (five) times daily as needed (thrush).) 35 tablet 0  . cyclobenzaprine (FLEXERIL) 10 MG tablet TAKE 1 TABLET BY MOUTH 3 TIMES DAILY AS NEEDED FOR MUSCLE SPASMS (Patient taking differently: Take 10-20 mg by mouth See admin instructions. Take two tablets (20 mg) by mouth daily at bedtime, may also take one tablet (10 mg) at noon as needed for muscle spasms) 90 tablet 2  . diltiazem (CARDIZEM CD) 120 MG 24 hr capsule TAKE 1 CAPSULE BY  MOUTH EVERY DAY (Patient taking differently: Take 120 mg by mouth daily.) 90 capsule 3  . diltiazem (CARDIZEM) 30 MG tablet TAKE 1 TABLET EVERY 4 HOURS AS NEEDED FOR AFIB HEART RATE >100 (Patient taking differently: Take 30 mg by mouth 2 (two) times daily.) 45 tablet 1  . DULoxetine (CYMBALTA) 30 MG capsule Take 30 mg by mouth daily.    [name] Kitchen ELIQUIS 5 MG TABS tablet TAKE 1 TABLET BY MOUTH TWICE  A DAY (TO REPLACE WARFARIN) (Patient taking differently: Take 5 mg by mouth 2 (two) times daily.) 60 tablet 5  . fluticasone (FLONASE) 50 MCG/ACT nasal spray PLACE 2 SPRAYS INTO BOTH NOSTRILS 2 TIMES DAILY (Patient taking differently: Place 2 sprays into both nostrils 2 (two) times daily.) 48 mL 1  . furosemide (LASIX) 40 MG tablet Take 1 tablet (40 mg total) by mouth daily. 30 tablet 3  . glimepiride (AMARYL) 4 MG tablet Take 4 mg by mouth daily.    . insulin aspart (NOVOLOG FLEXPEN) 100 UNIT/ML FlexPen Inject 10 Units into the skin 2 (two) times daily after a meal.    . Insulin Glargine (BASAGLAR KWIKPEN) 100 UNIT/ML SOPN Inject 12 Units into the skin daily before breakfast.     . lamoTRIgine (LAMICTAL) 25 MG tablet TAKE 1 TABLET BY MOUTH EVERYDAY AT BEDTIME (Patient taking differently: Take 25 mg by mouth at bedtime.) 90 tablet 0  . Lancets (ONETOUCH DELICA PLUS [id number]) MISC daily. as directed    . metFORMIN (GLUCOPHAGE) 500 MG tablet Take 500 mg by mouth 2 (two) times daily.  6  . metoprolol tartrate (LOPRESSOR) 50 MG tablet TAKE 1 TABLET BY MOUTH TWICE A DAY (Patient taking differently: Take 50 mg by mouth See admin instructions. Take one tablet (50 mg) by mouth twice daily, may also take one tablet (50 mg) midday as needed for SBP >150) 180 tablet 2  . montelukast (SINGULAIR) 10 MG tablet TAKE 1 TABLET BY MOUTH EVERY DAY (Patient taking differently: Take 10 mg by mouth at bedtime.) 90 tablet 1  . nitroGLYCERIN (NITROSTAT) 0.4 MG SL tablet Place 1 tablet (0.4 mg total) under the tongue every 5 (five) minutes as needed for chest pain. 25 tablet 6  . omeprazole (PRILOSEC) 20 MG capsule Take 20 mg by mouth daily.    Glory Rosebush ULTRA test strip USE AS DIRECTED 3 TIMES DAILY TO CHECK BLOOD SUGAR    . Oxycodone HCl 10 MG TABS Take 1 tablet (10 mg total) by mouth 3 (three) times daily as needed. 75 tablet 0  . OXYGEN Inhale 3 L into the lungs continuous.    . predniSONE (DELTASONE) 5 MG tablet Take  5 mg by mouth daily.    . promethazine (PHENERGAN) 25 MG tablet Take 1 tablet (25 mg total) by mouth at bedtime as needed for nausea. For nausea and sleep 30 tablet 0  . rosuvastatin (CRESTOR) 20 MG tablet Take 1 tablet (20 mg total) by mouth daily. 90 tablet 3  . sertraline (ZOLOFT) 100 MG tablet Take 100 mg by mouth daily.    [name] Kitchen dofetilide (TIKOSYN) 250 MCG capsule TAKE 1 CAPSULE BY MOUTH TWICE A DAY 180 capsule 2  . ferrous sulfate 325 (65 FE) MG tablet Take 1 tablet (325 mg total) by mouth daily with breakfast. 60 tablet 0  . LORazepam (ATIVAN) 1 MG tablet Take 1 tablet (1 mg total) by mouth every 8 (eight) hours as needed for anxiety. Takes daily (Patient taking differently: Take 1 mg by mouth 2 (  two) times daily.) 30 tablet 1   No facility-administered medications prior to visit.    ROS Review of Systems  Constitutional: Negative for appetite change, chills, diaphoresis, fatigue, fever and unexpected weight change.  HENT: Negative.   Eyes: Negative for visual disturbance.  Respiratory: Positive for shortness of breath. Negative for cough, chest tightness and wheezing.   Cardiovascular: Positive for leg swelling. Negative for chest pain and palpitations.  Gastrointestinal: Negative for abdominal pain, constipation, diarrhea, nausea and vomiting.  Endocrine: Negative.   Genitourinary: Negative.  Negative for difficulty urinating.  Musculoskeletal: Negative for arthralgias and myalgias.  Skin: Positive for color change and rash.  Neurological: Negative.  Negative for dizziness, weakness, light-headedness and headaches.  Hematological: Negative for adenopathy. Does not bruise/bleed easily.  Psychiatric/Behavioral: Negative for behavioral problems, confusion, decreased concentration, dysphoric mood and sleep disturbance. The patient is nervous/anxious.     Objective:  BP 140/90 (BP Location: Left Arm, Patient Position: Sitting, Cuff Size: Large)   Pulse 96   Temp 98.3 F (36.8 C)  (Oral)   Ht $R'5\' 1"'rp$  (1.549 m)   Wt 231 lb (104.8 kg)   SpO2 90%   BMI 43.65 kg/m   BP Readings from Last 3 Encounters:  08/13/20 140/90  07/14/20 (!) 150/62  05/22/20 (!) 144/73    Wt Readings from Last 3 Encounters:  08/13/20 231 lb (104.8 kg)  07/24/20 223 lb (101.2 kg)  07/14/20 225 lb 9.6 oz (102.3 kg)    Physical Exam Vitals reviewed.  Constitutional:      General: She is not in acute distress.    Appearance: She is obese. She is not toxic-appearing or diaphoretic.  HENT:     Nose: Nose normal.     Mouth/Throat:     Mouth: Mucous membranes are moist.  Eyes:     General: No scleral icterus.    Conjunctiva/sclera: Conjunctivae normal.  Cardiovascular:     Rate and Rhythm: Normal rate and regular rhythm.     Heart sounds: No murmur heard.   Pulmonary:     Effort: Pulmonary effort is normal.     Breath sounds: No stridor. No wheezing, rhonchi or rales.  Abdominal:     General: Abdomen is protuberant. Bowel sounds are normal. There is no distension.     Palpations: Abdomen is soft. There is no hepatomegaly, splenomegaly or mass.     Tenderness: There is no abdominal tenderness. There is no guarding.  Musculoskeletal:        General: No swelling.     Cervical back: Neck supple.     Right lower leg: Edema (trace pitting) present.     Left lower leg: Edema (trace pitting) present.  Lymphadenopathy:     Cervical: No cervical adenopathy.  Skin:    General: Skin is warm.     Findings: Erythema and rash present.     Comments: Under both breast and in both axillary regions, somewhat symmetrically, there are confluent areas of erythema with a few erythematous satellite lesions.  There is no scaling, induration, fluctuance, pustules, or vesicles.  Neurological:     General: No focal deficit present.     Mental Status: She is alert.     Lab Results  Component Value Date   WBC 8.2 08/13/2020   HGB 11.5 (L) 08/13/2020   HCT 34.4 (L) 08/13/2020   PLT 242.0 08/13/2020    GLUCOSE 195 (H) 07/14/2020   CHOL 111 11/30/2017   TRIG 129.0 11/30/2017   HDL 53.90 11/30/2017  LDLCALC 32 11/30/2017   ALT 20 07/12/2020   AST 21 07/12/2020   NA 136 07/14/2020   K 3.8 07/14/2020   CL 94 (L) 07/14/2020   CREATININE 0.97 07/14/2020   BUN 20 07/14/2020   CO2 28 07/14/2020   TSH 0.87 08/13/2020   INR 1.1 07/11/2020   HGBA1C 7.7 (H) 07/11/2020   MICROALBUR <0.7 08/13/2020    DG Chest Portable 1 View  Result Date: 07/11/2020 CLINICAL DATA:  Patient with decreased oxygen saturation. EXAM: PORTABLE CHEST 1 VIEW COMPARISON:  Chest radiograph 12/25/2019. FINDINGS: Monitoring leads overlie the patient. Stable enlarged cardiac and mediastinal contours status post median sternotomy. Slight interval increase in interstitial opacities bilaterally. No large pleural effusion or pneumothorax. Thoracic spine degenerative changes. IMPRESSION: Mild interval increase in interstitial opacities suggestive of edema. Electronically Signed   By: Lovey Newcomer M.D.   On: 07/11/2020 08:05   ECHOCARDIOGRAM COMPLETE  Result Date: 07/12/2020    ECHOCARDIOGRAM REPORT   Patient Name:   ELINA STRENG Date of Exam: 07/12/2020 Medical Rec #:  [id number]          Height:       61.0 in Accession #:    [id number]         Weight:       229.1 lb Date of Birth:  [birth year]          BSA:          2.001 m Patient Age:    24 years           BP:           160/74 mmHg Patient Gender: F                  HR:           85 bpm. Exam Location:  Inpatient Procedure: 2D Echo, Cardiac Doppler, Color Doppler and Intracardiac            Opacification Agent Indications:    Abnormal ECG 794.31 / R94.31  History:        Patient has prior history of Echocardiogram examinations, most                 recent 12/25/2019. Previous Myocardial Infarction and CAD, Prior                 CABG, Stroke and COPD, Aortic Valve Disease,                 Signs/Symptoms:Chest Pain; Risk Factors:Hypertension, Diabetes,                  Dyslipidemia and Former Smoker. GERD.  Sonographer:    Vickie Epley RDCS Referring Phys: [id number] RONDELL A SMITH IMPRESSIONS  1. There is no left ventricular thrombus with Definity contrast. 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 Left ventricular ejection fraction, by estimation, is 40 to 45%. The left ventricle has mildly decreased function. The left ventricle demonstrates regional wall motion abnormalities (see scoring diagram/findings for description). There is moderate concentric left ventricular hypertrophy. Left ventricular diastolic parameters are consistent with Grade II diastolic dysfunction (pseudonormalization). Elevated left atrial pressure. There is moderate hypokinesis of the left ventricular, mid-apical anterior wall and anteroseptal wall. There is mild dyskinesis of the left ventricular, entire apical segment.  2. Right ventricular systolic function is normal. The right ventricular size is normal. Tricuspid regurgitation signal is inadequate for assessing PA pressure.  3. Left atrial size was moderately dilated.  4. The mitral  valve is normal in structure. Mild mitral valve regurgitation. Moderate mitral annular calcification.  5. The aortic valve is bicuspid. Aortic valve regurgitation is not visualized. Mild aortic valve sclerosis is present, with no evidence of aortic valve stenosis.  6. The inferior vena cava is normal in size with greater than 50% respiratory variability, suggesting right atrial pressure of 3 mmHg. Comparison(s): Prior images reviewed side by side. Similar left ventricular wall motion abnormalities and EF were probably present on the previous study, but not well identified without the use of Definity contrast. FINDINGS  Left Ventricle: There is no left ventricular thrombus with Definity contrast. 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 Left ventricular ejection fraction, by estimation, is 40 to 45%. The left ventricle has mildly decreased function. The left ventricle demonstrates regional  wall motion abnormalities. Moderate hypokinesis of the left ventricular, mid-apical anterior wall and anteroseptal wall. Mild dyskinesis of the left ventricular, entire apical segment. Definity contrast agent was given IV to delineate the left ventricular endocardial borders. The left ventricular internal cavity size was normal in size. There is moderate concentric left ventricular hypertrophy. Left ventricular diastolic parameters are consistent with Grade II diastolic dysfunction (pseudonormalization). Elevated left atrial pressure.  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 The mid and distal anterior wall, mid and distal anterior septum, and apical inferior segment are hypokinetic. The entire lateral wall, inferior wall, basal anteroseptal segment, mid inferoseptal segment, basal anterior segment, and basal inferoseptal segment are normal. Right Ventricle: The right ventricular size is normal. No increase in right ventricular wall thickness. Right ventricular systolic function is normal. Tricuspid regurgitation signal is inadequate for assessing PA pressure. Left Atrium: Left atrial size was moderately dilated. Right Atrium: Right atrial size was normal in size. Pericardium: There is no evidence of pericardial effusion. Mitral Valve: The mitral valve is normal in structure. Moderate mitral annular calcification. Mild mitral valve regurgitation. Tricuspid Valve: The tricuspid valve is normal in structure. Tricuspid valve regurgitation is not demonstrated. Aortic Valve: The right and left aortic cusps are fused. The aortic valve is bicuspid. Aortic valve regurgitation is not visualized. Mild aortic valve sclerosis is present, with no evidence of aortic valve stenosis. Aortic valve mean gradient measures 11.0 mmHg. Aortic valve peak gradient measures 22.3 mmHg. Aortic valve area, by VTI measures 2.05 cm. Pulmonic Valve: The pulmonic valve was grossly normal. Pulmonic valve regurgitation is not visualized. Aorta:  The aortic root and ascending aorta are structurally normal, with no evidence of dilitation. Venous: The inferior vena cava is normal in size with greater than 50% respiratory variability, suggesting right atrial pressure of 3 mmHg. IAS/Shunts: No atrial level shunt detected by color flow Doppler.  LEFT VENTRICLE PLAX 2D LVIDd:         3.60 cm      Diastology LVIDs:         2.20 cm      LV e' medial:    2.89 cm/s LV PW:         1.60 cm      LV E/e' medial:  58.8 LV IVS:        1.40 cm      LV e' lateral:   5.22 cm/s LVOT diam:     2.20 cm      LV E/e' lateral: 32.6 LV SV:         91 LV SV Index:   45 LVOT Area:     3.80 cm  LV Volumes (MOD) LV vol d, MOD A2C: 89.2  ml LV vol d, MOD A4C: 117.0 ml LV vol s, MOD A2C: 57.2 ml LV vol s, MOD A4C: 72.9 ml LV SV MOD A2C:     32.0 ml LV SV MOD A4C:     117.0 ml LV SV MOD BP:      40.7 ml RIGHT VENTRICLE RV S prime:     10.10 cm/s LEFT ATRIUM             Index       RIGHT ATRIUM           Index LA diam:        4.70 cm 2.35 cm/m  RA Area:     16.60 cm LA Vol (A2C):   46.0 ml 22.99 ml/m RA Volume:   43.00 ml  21.49 ml/m LA Vol (A4C):   55.8 ml 27.89 ml/m LA Biplane Vol: 51.7 ml 25.84 ml/m  AORTIC VALVE AV Area (Vmax):    1.98 cm AV Area (Vmean):   2.07 cm AV Area (VTI):     2.05 cm AV Vmax:           236.00 cm/s AV Vmean:          149.000 cm/s AV VTI:            0.444 m AV Peak Grad:      22.3 mmHg AV Mean Grad:      11.0 mmHg LVOT Vmax:         123.00 cm/s LVOT Vmean:        81.100 cm/s LVOT VTI:          0.239 m LVOT/AV VTI ratio: 0.54  AORTA Ao Root diam: 3.00 cm MITRAL VALVE MV Area (PHT): 4.68 cm     SHUNTS MV Decel Time: 162 msec     Systemic VTI:  0.24 m MR Peak grad: 88.7 mmHg     Systemic Diam: 2.20 cm MR Vmax:      471.00 cm/s MV E velocity: 170.00 cm/s MV A velocity: 107.00 cm/s MV E/A ratio:  1.59 Mihai Croitoru MD Electronically signed by Sanda Klein MD Signature Date/Time: 07/12/2020/10:02:16 AM    Final     Assessment & Plan:   Leandrea was seen  today for anemia, hyperlipidemia, diabetes and rash.  Diagnoses and all orders for this visit:  Hypertension associated with diabetes ([place])- Her blood pressure is adequately well controlled. -     TSH; Future -     Urinalysis, Routine w reflex microscopic; Future -     Urinalysis, Routine w reflex microscopic -     TSH  Paroxysmal atrial fibrillation (HCC); CHA2DS2VASc score F, HTN, CAD, CVA = 5- She is maintaining sinus rhythm.  Will continue anticoagulation with the DOAC. -     TSH; Future -     TSH -     dofetilide (TIKOSYN) 250 MCG capsule; Take 1 capsule (250 mcg total) by mouth 2 (two) times daily.  Type 2 diabetes mellitus with peripheral neuropathy ([place])- Considering her comorbid illnesses her blood sugar is adequately well controlled. -     HM Diabetes Foot Exam -     Urinalysis, Routine w reflex microscopic; Future -     Microalbumin / creatinine urine ratio; Future -     Microalbumin / creatinine urine ratio -     Urinalysis, Routine w reflex microscopic  Dyslipidemia, goal LDL below 70- She is doing well on the statin. -     TSH; Future -  TSH  Normocytic anemia  Deficiency anemia- I will screen her for vitamin deficiencies. -     CBC with Differential/Platelet; Future -     Iron; Future -     Ferritin; Future -     Folate; Future -     Vitamin B12; Future -     Vitamin B12 -     Folate -     Ferritin -     Iron -     CBC with Differential/Platelet  Flu vaccine need -     Flu Vaccine QUAD 6+ mos PF IM (Fluarix Quad PF)  Need for pneumococcal vaccination -     Pneumococcal conjugate vaccine 13-valent IM  Visit for screening mammogram -     MM DIGITAL SCREENING BILATERAL; Future  Candidal skin infection -     fluconazole (DIFLUCAN) 100 MG tablet; Take 1 tablet (100 mg total) by mouth daily for 10 days.  GAD (generalized anxiety disorder) -     LORazepam (ATIVAN) 1 MG tablet; Take 1 tablet (1 mg total) by mouth 2 (two) times daily.   I have  discontinued Arrington L. Rozzell's ferrous sulfate. I have also changed her LORazepam and dofetilide. Additionally, I am having her start on fluconazole. Lastly, I am having her maintain her azaTHIOprine, calcitRIOL, nitroGLYCERIN, metFORMIN, cyclobenzaprine, Basaglar KwikPen, clotrimazole, albuterol, predniSONE, budesonide, rosuvastatin, arformoterol, OneTouch Ultra, B-D ULTRAFINE III SHORT PEN, diltiazem, omeprazole, albuterol, glimepiride, DULoxetine, fluticasone, Eliquis, OneTouch Delica Plus [id number], metoprolol tartrate, ONE TOUCH ULTRA 2, montelukast, diltiazem, lamoTRIgine, sertraline, OXYGEN, NovoLOG FlexPen, acetaminophen, promethazine, furosemide, and Oxycodone HCl.  Meds ordered this encounter  Medications  . fluconazole (DIFLUCAN) 100 MG tablet    Sig: Take 1 tablet (100 mg total) by mouth daily for 10 days.    Dispense:  10 tablet    Refill:  0  . LORazepam (ATIVAN) 1 MG tablet    Sig: Take 1 tablet (1 mg total) by mouth 2 (two) times daily.    Dispense:  30 tablet    Refill:  5  . dofetilide (TIKOSYN) 250 MCG capsule    Sig: Take 1 capsule (250 mcg total) by mouth 2 (two) times daily.    Dispense:  180 capsule    Refill:  2     Follow-up: Return in about 4 months (around 12/12/2020).  Scarlette Calico, MD

## 2020-08-14 LAB — URINALYSIS, ROUTINE W REFLEX MICROSCOPIC
Bilirubin Urine: NEGATIVE
Hgb urine dipstick: NEGATIVE
Ketones, ur: NEGATIVE
Nitrite: NEGATIVE
RBC / HPF: NONE SEEN (ref 0–?)
Specific Gravity, Urine: 1.01 (ref 1.000–1.030)
Total Protein, Urine: NEGATIVE
Urine Glucose: 100 — AB
Urobilinogen, UA: 0.2 (ref 0.0–1.0)
pH: 7 (ref 5.0–8.0)

## 2020-08-19 ENCOUNTER — Telehealth: Payer: Self-pay

## 2020-08-19 DIAGNOSIS — M47816 Spondylosis without myelopathy or radiculopathy, lumbar region: Secondary | ICD-10-CM

## 2020-08-19 MED ORDER — CYCLOBENZAPRINE HCL 10 MG PO TABS
10.0000 mg | ORAL_TABLET | ORAL | 2 refills | Status: DC
Start: 2020-08-19 — End: 2021-01-30

## 2020-08-19 NOTE — Telephone Encounter (Signed)
Notified. 

## 2020-08-19 NOTE — Telephone Encounter (Signed)
Katherine [name] is out of the Cyclobenzaprine 10 MG. She wanted to know if you will take over prescribing the Rx. Because Dr. Jimmy Footman (Nephrologist) has retired.   Call back ph [phone].

## 2020-08-19 NOTE — Telephone Encounter (Signed)
Order sent in.  thx

## 2020-08-20 ENCOUNTER — Other Ambulatory Visit: Payer: Self-pay

## 2020-08-20 DIAGNOSIS — M797 Fibromyalgia: Secondary | ICD-10-CM

## 2020-08-20 NOTE — Telephone Encounter (Signed)
Katherine [name] is calling for a refill  Oxy 10 MG. She only has one pill left and her leg hurts. The Flexeril alone is not helping.   Please advise or prescribe.   PMR: Last filled on 07/24/2020 Call back ph [phone]

## 2020-08-21 ENCOUNTER — Telehealth: Payer: Self-pay | Admitting: Internal Medicine

## 2020-08-21 ENCOUNTER — Telehealth: Payer: Self-pay

## 2020-08-21 DIAGNOSIS — M797 Fibromyalgia: Secondary | ICD-10-CM

## 2020-08-21 IMAGING — DX DG CHEST 1V PORT
1 series · 1 of 1 positions shown · non-contrast
Comparison: One-view chest x-ray 07/25/2019

CLINICAL DATA: Recent hospitalization. Recent pneumonia. Shortness
of breath today.

EXAM:
PORTABLE CHEST 1 VIEW

[chest]
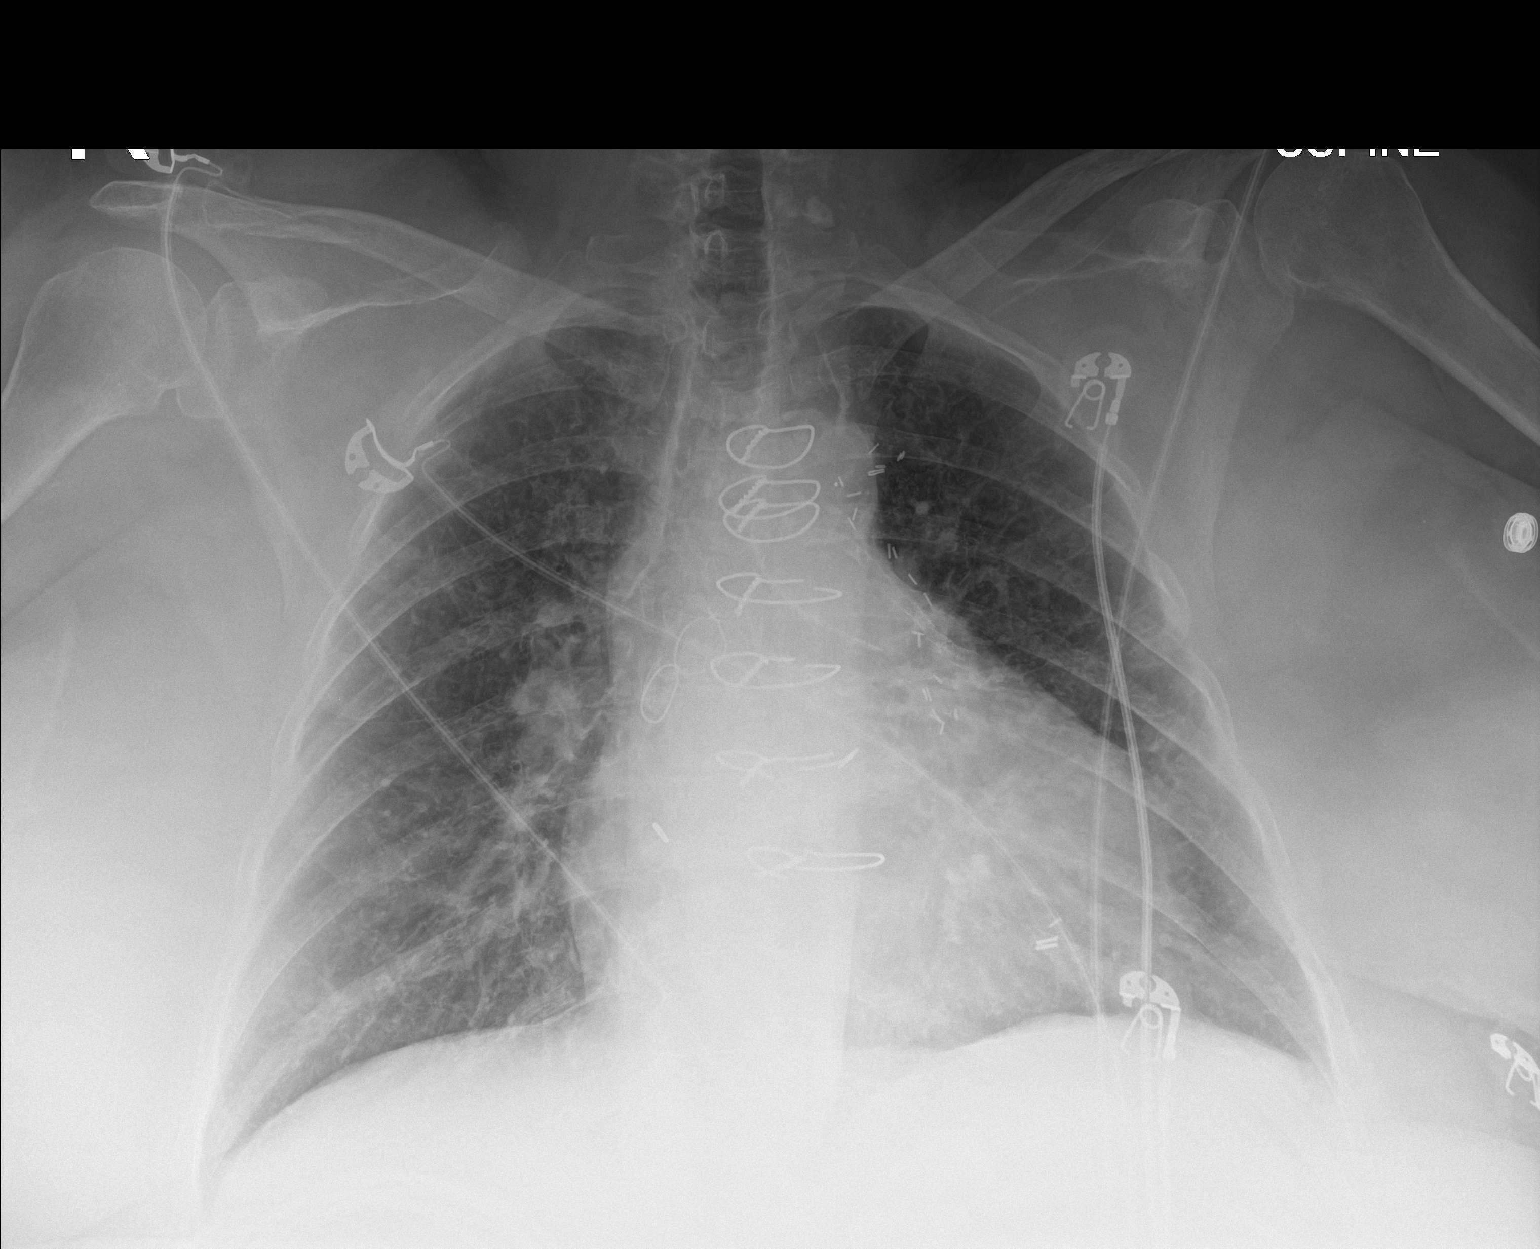

[1 of 1 positions shown; findings below may reference images not displayed]

FINDINGS: The heart is enlarged. Patient is status post median sternotomy for
CABG. Sternal wires are broken, stable.

Mild interstitial coarsening is present without focal airspace
consolidation. Aeration is improved since the most recent exam.
There are no significant effusions.
IMPRESSION: 1. Stable cardiomegaly without failure.
2. Mild interstitial coarsening is likely chronic.
3. No focal airspace disease.

## 2020-08-21 NOTE — Telephone Encounter (Signed)
Her serum creatinine was 0.97 Her kidney function is very good

## 2020-08-21 NOTE — Telephone Encounter (Signed)
Pt has been informed of all 12/28 results. She asked specifically of the creatinine number and she was told the 32.3 value. Pt stated that number was incorrect because she was never told a number like that before so she was informed of her creatinine result from her 11/28 hospital encounter. Pt stated she wanted notations from PCP in regard to her kidney function because she is a kidney transplant recipient.

## 2020-08-21 NOTE — Telephone Encounter (Signed)
Patient would like her lab results from 12.28.21 [phone]

## 2020-08-21 NOTE — Telephone Encounter (Signed)
Patient is calling requesting Oxycodone to be ok to refill today. She states she took her last one this AM and will not have one for the PM. Last filled #75(25 day supply)  on 07/24/2020

## 2020-08-22 MED ORDER — OXYCODONE HCL 10 MG PO TABS: 10.0000 mg | ORAL_TABLET | Freq: Three times a day (TID) | ORAL | 0 refills | Status: DC | PRN

## 2020-08-22 NOTE — Telephone Encounter (Signed)
Called pt, LVM.   

## 2020-08-22 NOTE — Telephone Encounter (Signed)
PMP was Reviewed: Dr Naaman Plummer note was reviewed. Support Staff will call Ms. Mulhearn to schedule her appointment with Dr Naaman Plummer in the morning, she verbalizes understanding.

## 2020-08-22 NOTE — Telephone Encounter (Signed)
For some reason this did not get routed to Dr. Naaman Plummer but I did route it. Can you address in his absence. Patient last appt 12/8/021 which was a telehealth visit, asked to schedule for 2 months. Patient is hurting, no pain meds since yesterday morning. I called the pharmacy and there are no prescription on file at the pharmacy.

## 2020-08-23 NOTE — Telephone Encounter (Signed)
THank you, [name] and team!

## 2020-08-24 IMAGING — CR DG SINUSES 1-2V
3 series · 3 of 3 positions shown · non-contrast
Comparison: The

CLINICAL DATA: Sinus congestion.

EXAM:
PARANASAL SINUSES - 1-2 VIEW

[[person_name] (1 of 3)]
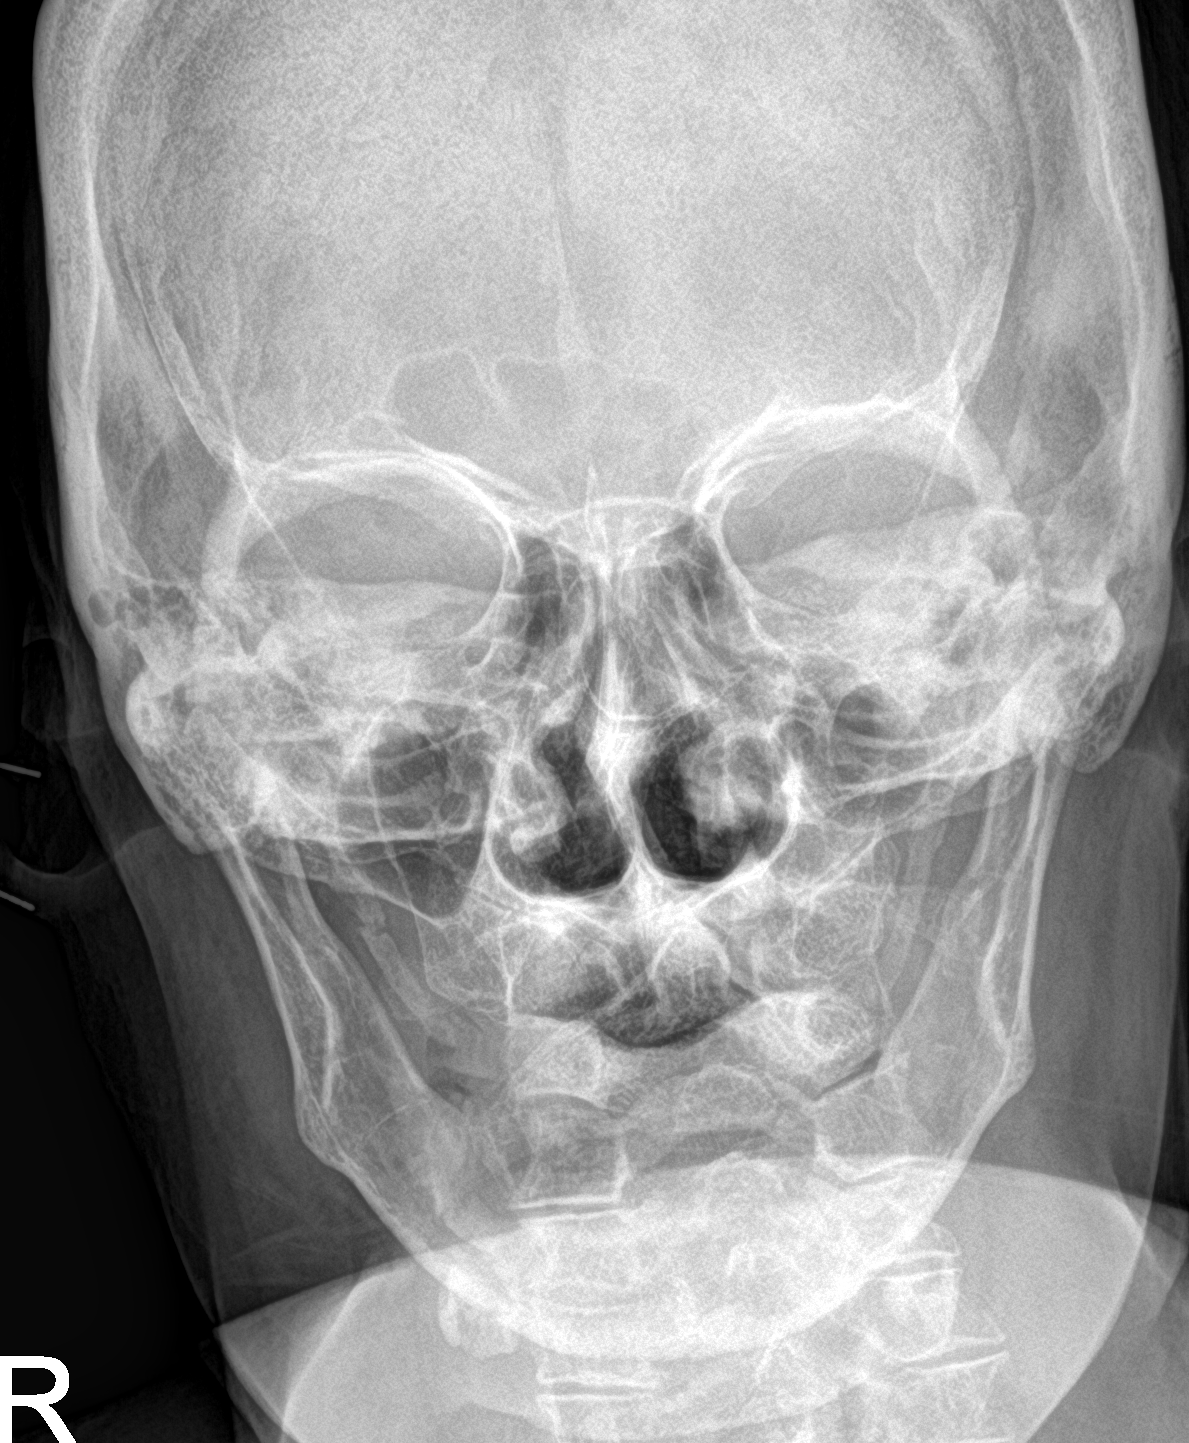

[[person_name] (2 of 3)]
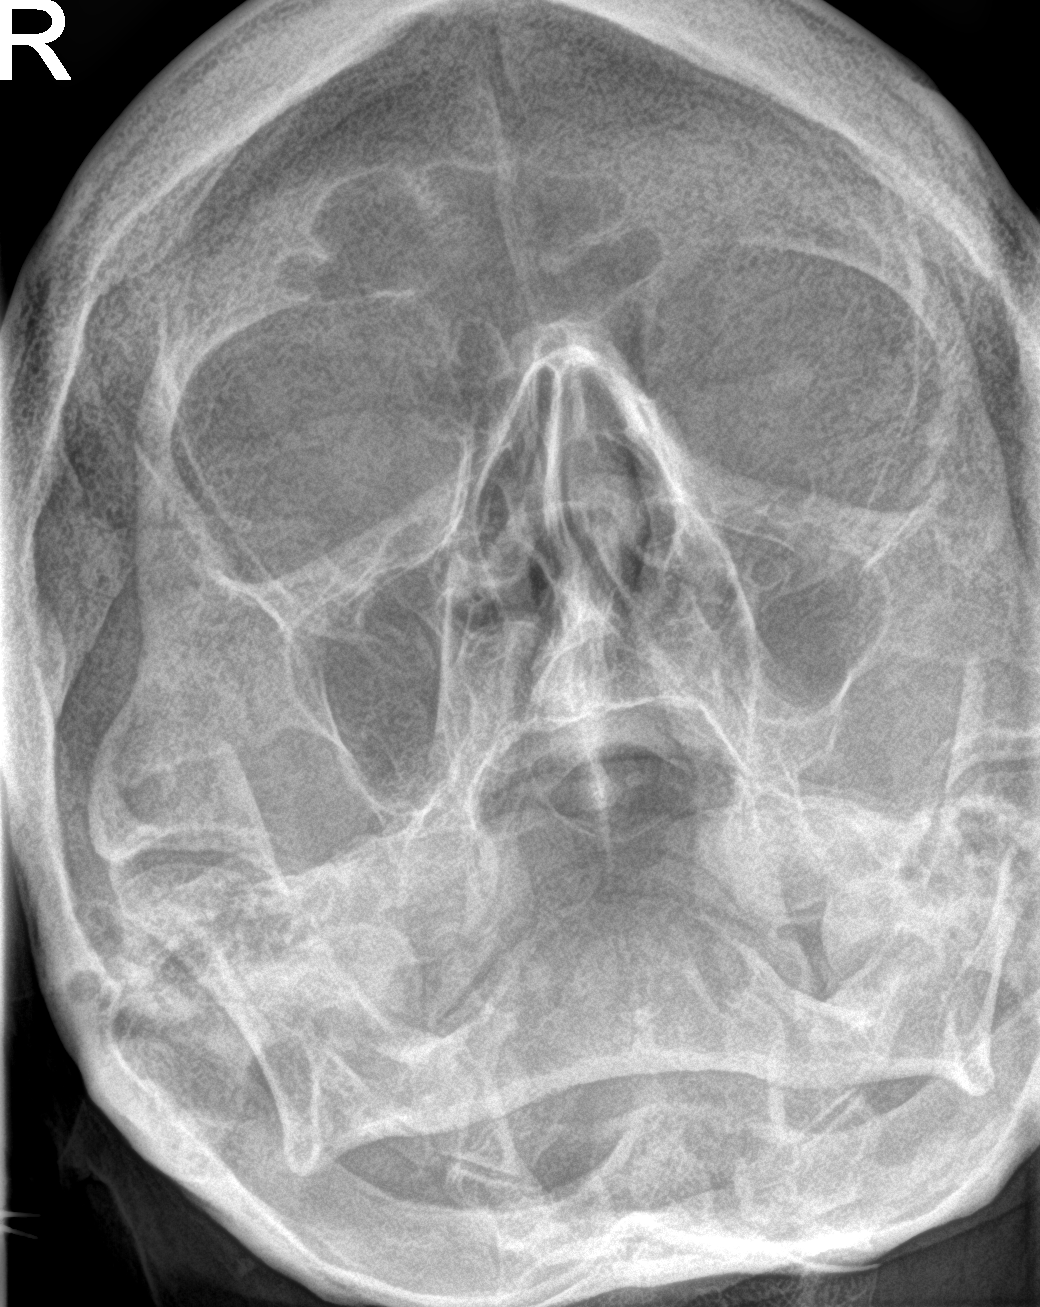

[[person_name] (3 of 3)]
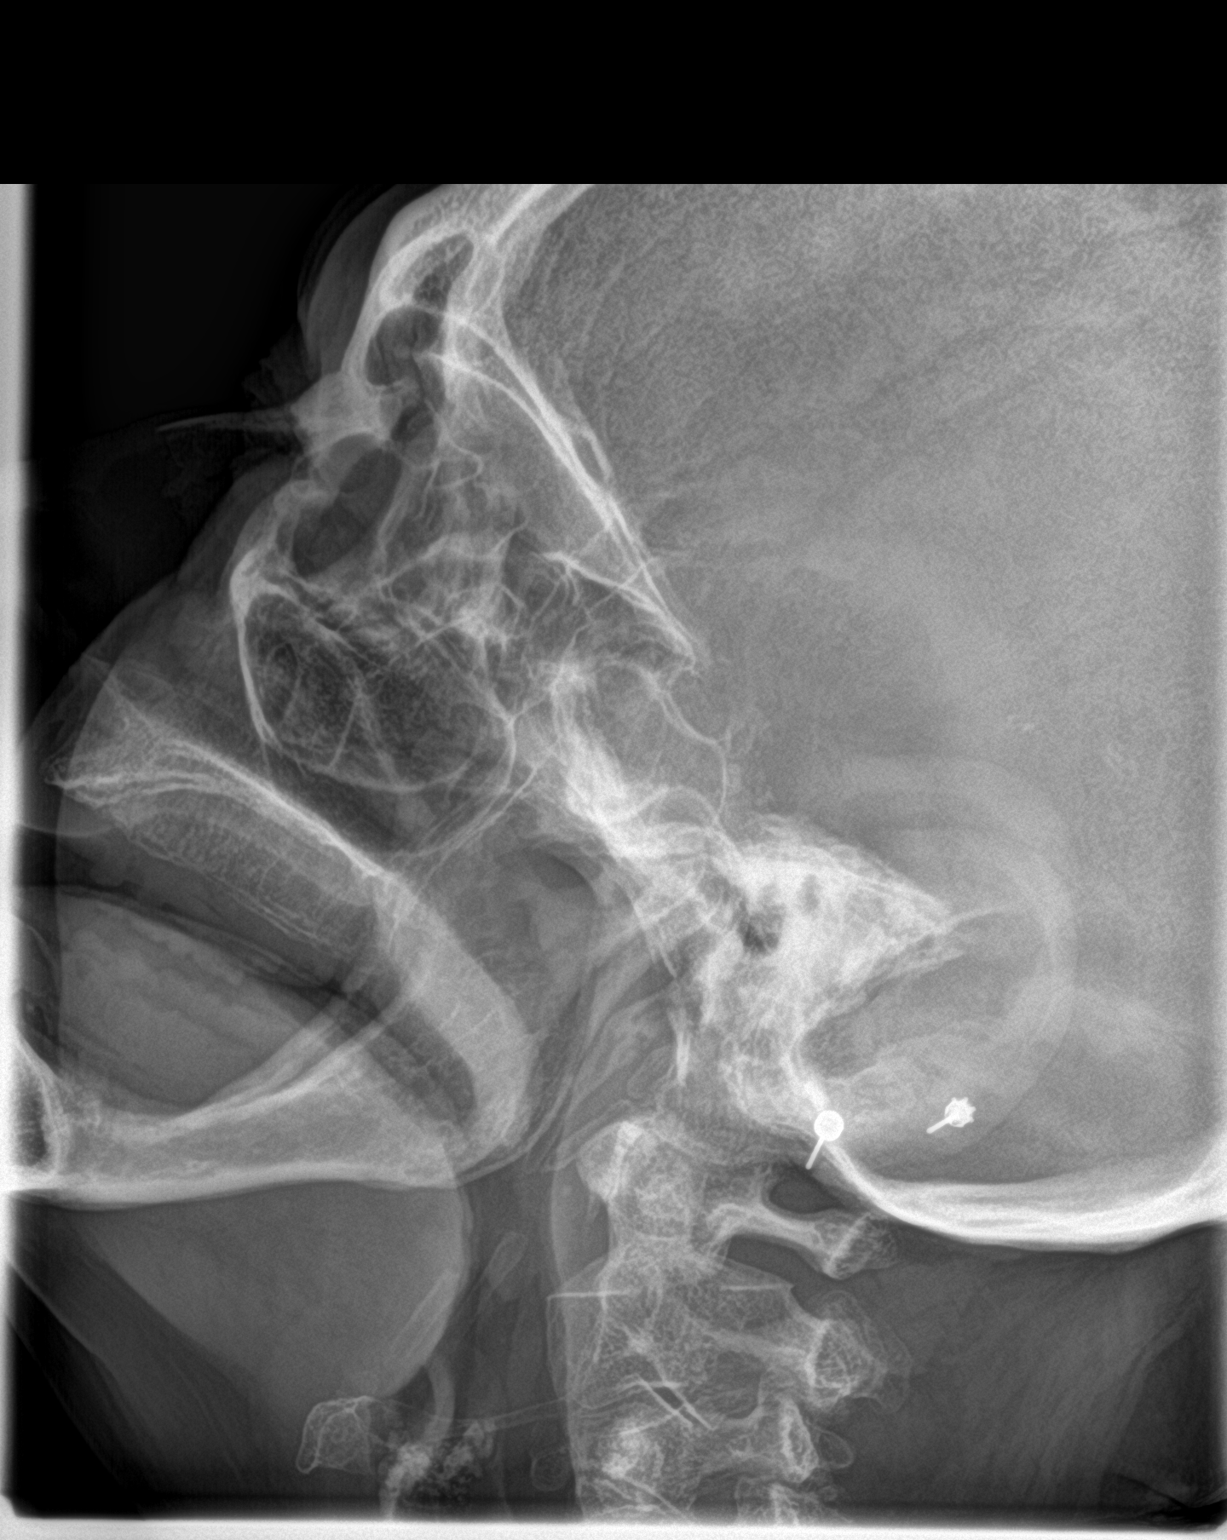

[3 of 3 positions shown; findings below may reference images not displayed]

FINDINGS: The paranasal sinus are aerated. There is no evidence of sinus
opacification air-fluid levels or mucosal thickening. No significant
bone abnormalities are seen.
IMPRESSION: Negative.

## 2020-08-27 ENCOUNTER — Telehealth: Payer: Self-pay | Admitting: Adult Health

## 2020-08-27 NOTE — Telephone Encounter (Signed)
Per other phone note dated today regarding this, Lauren documented that she would call [employer].  I asked Lauren about this and she states that she did not call [employer], but meant that the patient needs to call [employer]. Called Melissa at [facility], states that they have been trying to contact patient to have her schedule an appt for qualifying O2 walk.  Called pt to make aware.  Scheduled 09/23/20 at pt's request.  Nothing further needed at this time- will close encounter.

## 2020-08-27 NOTE — Telephone Encounter (Signed)
Called patient. She said she gets her oxygen from [employer].  I asked patient if she had called and spoke with [employer] about her oxygen, she said no. I said I would call [employer] first and make sure the account is okay. That could have been a scam. Patient voiced understanding. Nothing further needed at this time.

## 2020-09-03 ENCOUNTER — Telehealth: Payer: Self-pay | Admitting: Internal Medicine

## 2020-09-03 NOTE — Telephone Encounter (Signed)
Patient has a uti In a lot of pain [id number] - [place], [place] - [address] Phone:  [phone]  Fax:  [phone]     Please call the patient at [phone]

## 2020-09-04 ENCOUNTER — Telehealth: Payer: Self-pay | Admitting: *Deleted

## 2020-09-04 NOTE — Telephone Encounter (Signed)
Noted  

## 2020-09-04 NOTE — Telephone Encounter (Signed)
Does pt need to come for urine testing? Please advise.

## 2020-09-04 NOTE — Telephone Encounter (Signed)
Ms Trenkamp called and requests Dr Naaman Plummer give her a call before he leaves today.  # [phone]

## 2020-09-04 NOTE — Telephone Encounter (Signed)
Mrs Mcgillis requested to take an extra pain pill for pain from UTI. I told her that was not an appropriate use of her oxycodone

## 2020-09-04 NOTE — Telephone Encounter (Signed)
Ask her to come in for a UA and urine culture

## 2020-09-04 NOTE — Telephone Encounter (Signed)
  Patient declined to come to the office, no transportation

## 2020-09-05 IMAGING — CT CT CHEST W/O CM
2 of 3 series · 15 of 36 positions shown, 18 images · non-contrast
Comparison: 06/18/2017 chest CT.

CLINICAL DATA: Persistent asthmatic bronchitis and healthcare
associated pneumonia.

EXAM:
CT CHEST WITHOUT CONTRAST
TECHNIQUE: Multidetector CT imaging of the chest was performed following the
standard protocol without IV contrast.

[Series 2: thorax · axial · 0.62mm/px · z∈[-340,-120]mm · 12 of 130 slices shown, 15 images]
[im 10/130  mediastinal]
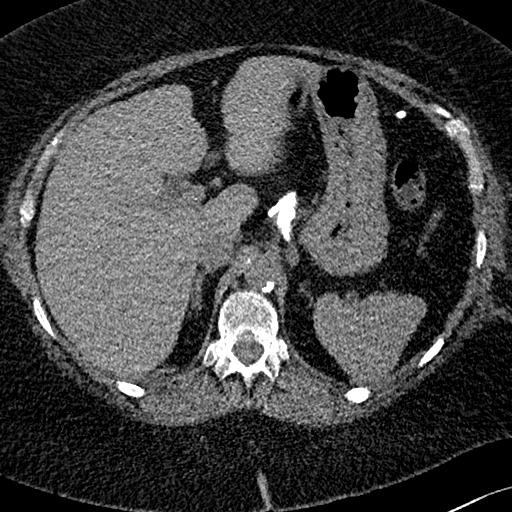
[im 10/130  lung]
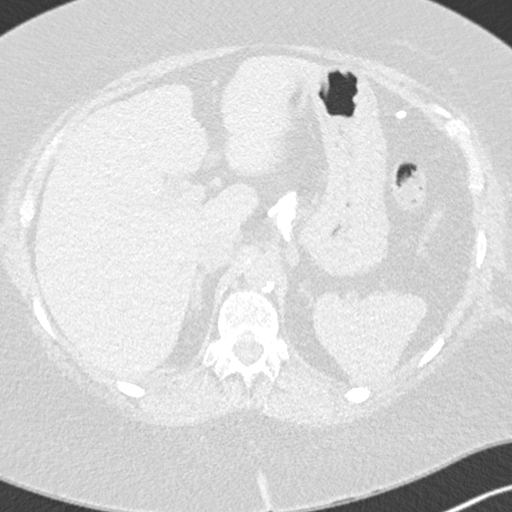
[im 20/130  lung]
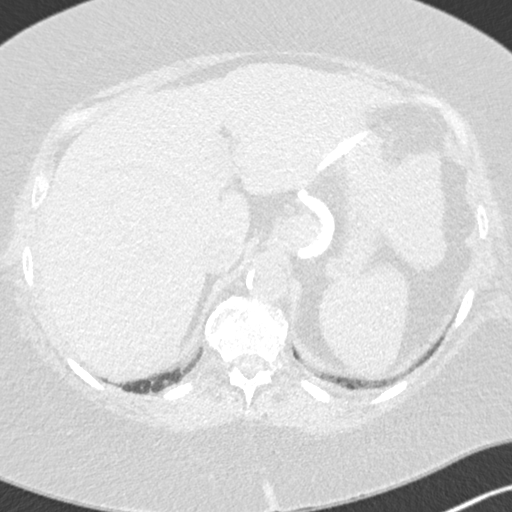
[im 29/130  lung]
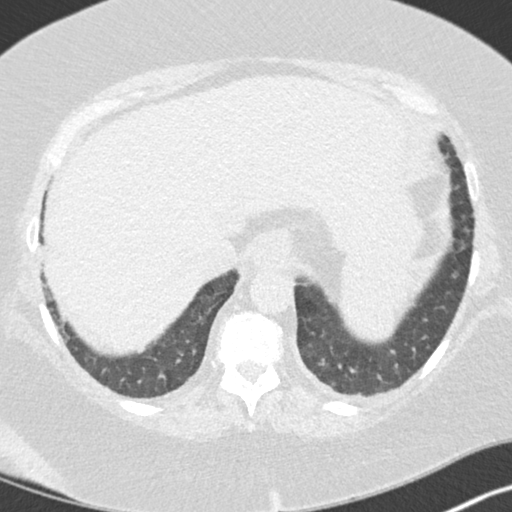
[im 39/130  lung]
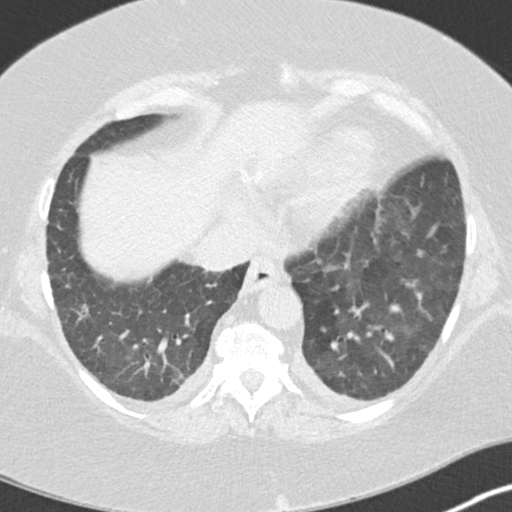
[im 48/130  mediastinal]
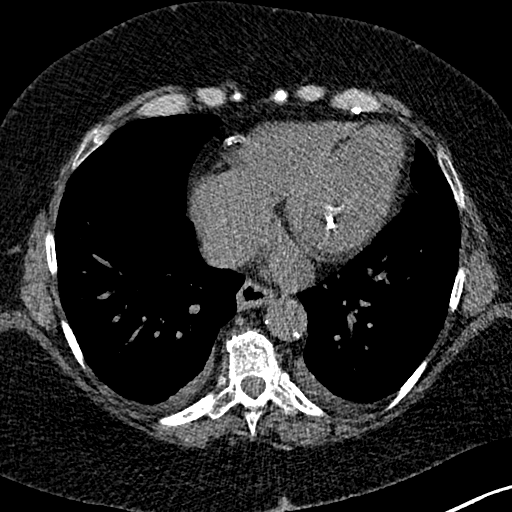
[im 48/130  lung]
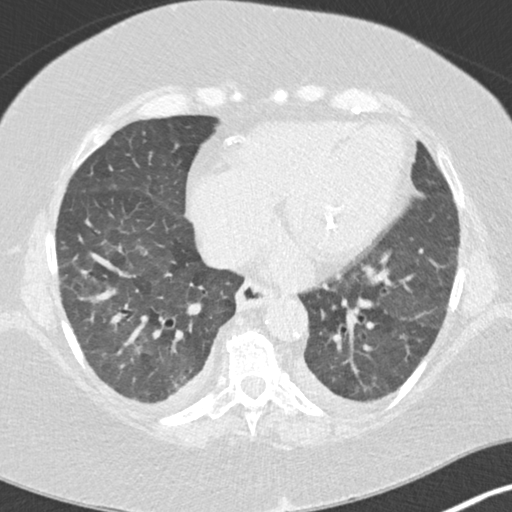
[im 58/130  lung]
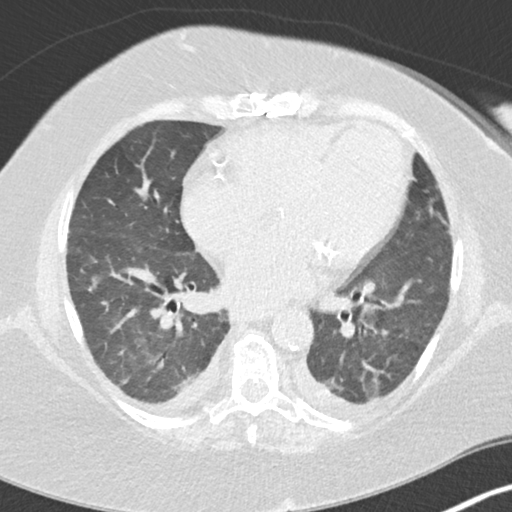
[im 72/130  lung]
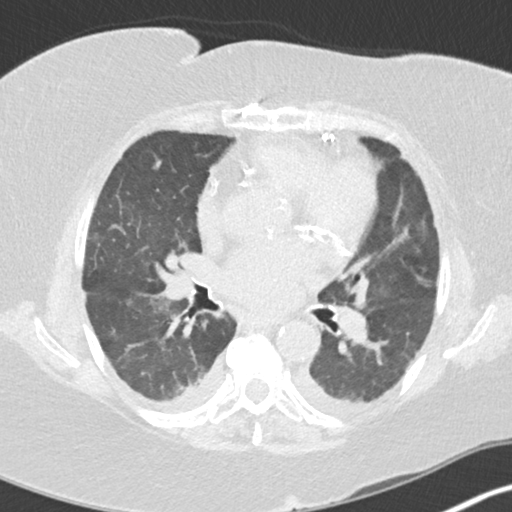
[im 82/130  lung]
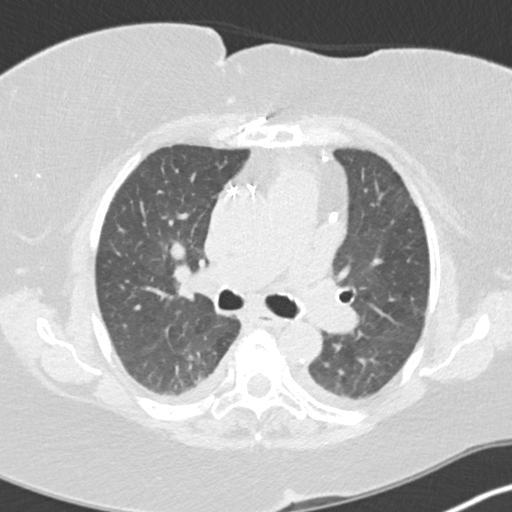
[im 91/130  mediastinal]
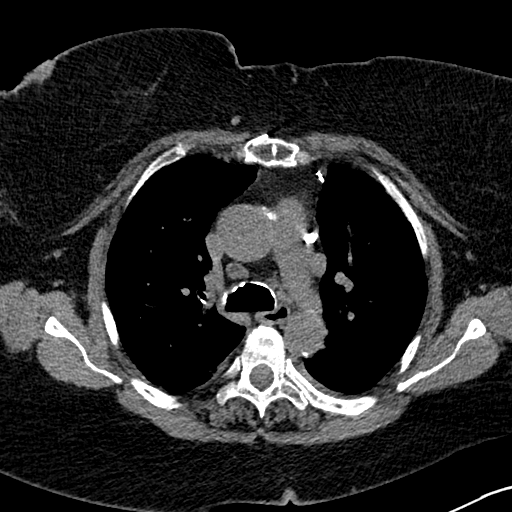
[im 91/130  lung]
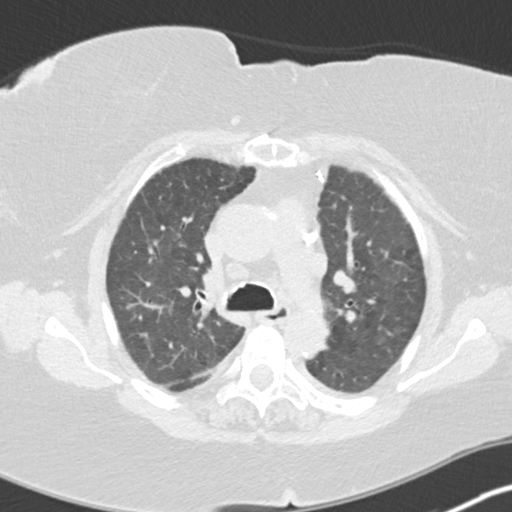
[im 101/130  lung]
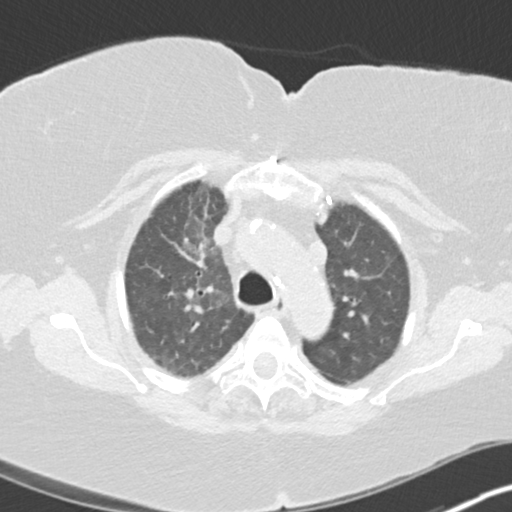
[im 110/130  lung]
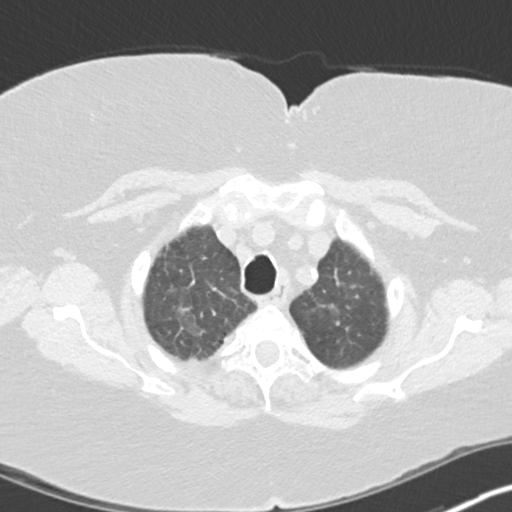
[im 120/130  lung]
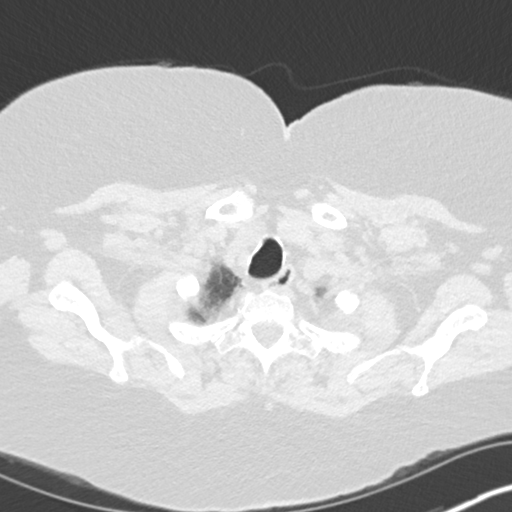

[Series 6: coronal · coronal · 0.54mm/px · 3 of 129 slices shown]
[im 26/129  lung]
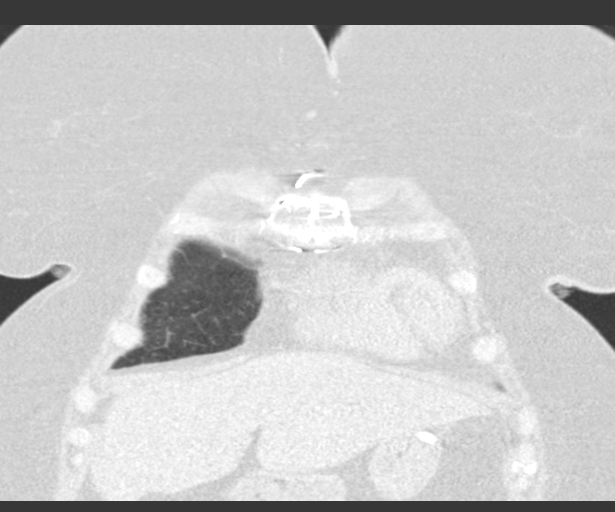
[im 52/129  lung]
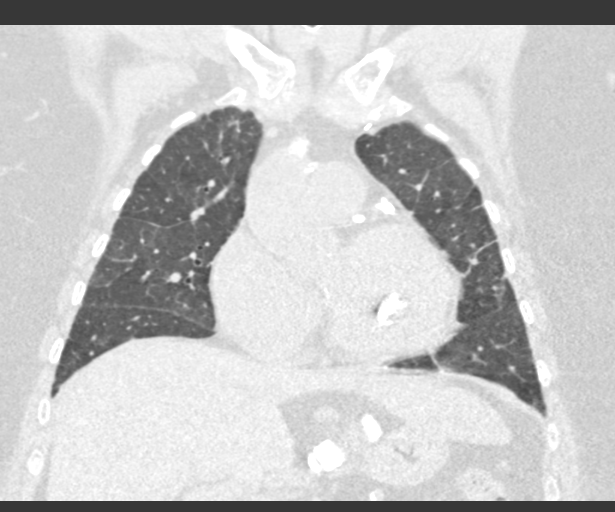
[im 77/129  lung]
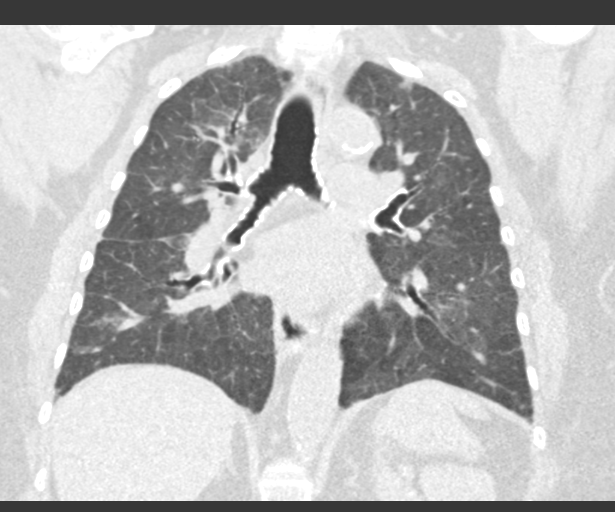

[15 of 36 positions shown; findings below may reference images not displayed]

FINDINGS: Cardiovascular: Mild cardiomegaly. No significant pericardial
effusion/thickening. Three-vessel coronary atherosclerosis status
post CABG. Atherosclerotic nonaneurysmal thoracic aorta. Stable
top-normal caliber main pulmonary artery (3.0 cm diameter).
Duplicated SVC.

Mediastinum/Nodes: Coarse subcentimeter calcifications scattered in
the visualized thyroid bilaterally, unchanged. Unremarkable
esophagus. No pathologically enlarged axillary, mediastinal or hilar
lymph nodes, noting limited sensitivity for the detection of hilar
adenopathy on this noncontrast study.

Lungs/Pleura: No pneumothorax. Trace dependent bilateral pleural
effusions. Mosaic attenuation throughout both lungs. Mild patchy
ground-glass opacity throughout both lungs, most prominent in the
right upper and right lower lobes. No acute consolidative airspace
disease, lung masses or significant pulmonary nodules. No
significant regions of bronchiectasis. Scattered mild regions of
interlobular septal thickening in both lungs.

Upper abdomen: Well-positioned gastric band in the gastric cardia
region with intact appearing visualized tube vein. Cholecystectomy.

Musculoskeletal: No aggressive appearing focal osseous lesions.
Stable discontinuities in the 3 lower most sternotomy wires. Marked
thoracic spondylosis.
IMPRESSION: 1. Mild cardiomegaly.
2. Trace dependent bilateral pleural effusions.
3. Mosaic attenuation throughout both lungs, nonspecific,
differential includes mosaic perfusion from pulmonary vascular
disease versus air trapping from small airways disease.
4. Nonspecific mild patchy ground-glass opacity throughout both
lungs with scattered mild interlobular septal thickening.
Differential is broad and includes mild cardiogenic pulmonary edema,
drug toxicity, atypical infection and hypersensitivity pneumonitis.
5. Duplicated SVC.

Aortic Atherosclerosis ([id number]).

## 2020-09-11 ENCOUNTER — Telehealth: Payer: Self-pay | Admitting: Internal Medicine

## 2020-09-11 NOTE — Telephone Encounter (Signed)
  LORazepam (ATIVAN) 1 MG tablet Patient wondering if we can give 60 instead of 30 when she gets refills for this medication because it is listed that she takes it twice a day.

## 2020-09-12 ENCOUNTER — Other Ambulatory Visit: Payer: Self-pay | Admitting: Internal Medicine

## 2020-09-12 DIAGNOSIS — F411 Generalized anxiety disorder: Secondary | ICD-10-CM

## 2020-09-12 MED ORDER — LORAZEPAM 1 MG PO TABS: 1.0000 mg | ORAL_TABLET | Freq: Two times a day (BID) | ORAL | 5 refills | Status: DC

## 2020-09-18 DIAGNOSIS — H2513 Age-related nuclear cataract, bilateral: Secondary | ICD-10-CM | POA: Diagnosis not present

## 2020-09-18 DIAGNOSIS — G8911 Acute pain due to trauma: Secondary | ICD-10-CM | POA: Diagnosis not present

## 2020-09-18 DIAGNOSIS — H1132 Conjunctival hemorrhage, left eye: Secondary | ICD-10-CM | POA: Diagnosis not present

## 2020-09-19 ENCOUNTER — Telehealth: Payer: Self-pay

## 2020-09-19 DIAGNOSIS — M797 Fibromyalgia: Secondary | ICD-10-CM

## 2020-09-19 NOTE — Telephone Encounter (Signed)
Dr. Naaman Plummer is out of the office.   Will you send Katherine [name] [facility] mg[facility]? Last filled on 08/23/2019.

## 2020-09-20 ENCOUNTER — Other Ambulatory Visit (HOSPITAL_COMMUNITY): Payer: Self-pay | Admitting: Nurse Practitioner

## 2020-09-20 MED ORDER — OXYCODONE HCL 10 MG PO TABS: 10.0000 mg | ORAL_TABLET | Freq: Three times a day (TID) | ORAL | 0 refills | Status: DC | PRN

## 2020-09-20 NOTE — Telephone Encounter (Signed)
PMP was Reviewed: Oxycodone e-scribed today. Placed a call to Ms. Ransford regarding the above, she verbalizes understanding.  Ms. Whidbee requesting a ESI injection, she has a scheduled appointmnet  With Dr Naaman Plummer on 09/25/2020, she was instructed to speak with Dr Naaman Plummer regarding the above. She verbalizes understanding.

## 2020-09-23 ENCOUNTER — Ambulatory Visit: Payer: Medicare Other | Admitting: Adult Health

## 2020-09-23 ENCOUNTER — Ambulatory Visit: Payer: Medicare Other | Admitting: Cardiology

## 2020-09-25 ENCOUNTER — Encounter: Payer: Medicare Other | Admitting: Physical Medicine & Rehabilitation

## 2020-09-27 ENCOUNTER — Ambulatory Visit: Payer: Medicare Other | Admitting: Adult Health

## 2020-10-02 ENCOUNTER — Other Ambulatory Visit: Payer: Self-pay | Admitting: Cardiology

## 2020-10-02 NOTE — Telephone Encounter (Signed)
Prescription refill request for Eliquis received. Indication:atrial fibrillation Last office visit:8/21 donna Scr:0.97  11/21 Age: 65 Weight:104.8 kg  Prescription refilled

## 2020-10-08 ENCOUNTER — Telehealth: Payer: Self-pay | Admitting: Emergency Medicine

## 2020-10-08 MED ORDER — FLUTICASONE PROPIONATE 50 MCG/ACT NA SUSP: NASAL | 1 refills | Status: DC

## 2020-10-08 NOTE — Telephone Encounter (Signed)
I called and spoke with pt and she is aware of refill that has been sent in to the pharmacy.  Nothing further is needed.

## 2020-10-09 ENCOUNTER — Encounter: Payer: Medicare Other | Attending: Physical Medicine & Rehabilitation | Admitting: Registered Nurse

## 2020-10-09 DIAGNOSIS — M797 Fibromyalgia: Secondary | ICD-10-CM | POA: Insufficient documentation

## 2020-10-09 DIAGNOSIS — M4726 Other spondylosis with radiculopathy, lumbar region: Secondary | ICD-10-CM | POA: Insufficient documentation

## 2020-10-09 DIAGNOSIS — M1711 Unilateral primary osteoarthritis, right knee: Secondary | ICD-10-CM | POA: Insufficient documentation

## 2020-10-12 ENCOUNTER — Other Ambulatory Visit (HOSPITAL_COMMUNITY): Payer: Self-pay | Admitting: Nurse Practitioner

## 2020-10-16 ENCOUNTER — Ambulatory Visit: Payer: Medicare Other | Admitting: Cardiology

## 2020-10-17 ENCOUNTER — Ambulatory Visit: Payer: Medicare Other | Admitting: Emergency Medicine

## 2020-10-18 ENCOUNTER — Telehealth: Payer: Self-pay

## 2020-10-18 DIAGNOSIS — M797 Fibromyalgia: Secondary | ICD-10-CM

## 2020-10-18 MED ORDER — OXYCODONE HCL 10 MG PO TABS: 10.0000 mg | ORAL_TABLET | Freq: Three times a day (TID) | ORAL | 0 refills | Status: DC | PRN

## 2020-10-18 NOTE — Telephone Encounter (Signed)
Return [name] call, PMP was reviewed.  Katherine [name] missed her last appointment , she will only be given one week supply of Oxycodone. She is scheduled for a F/U appointment on 10/23/2020, she realizes she must keep her appointment, she verbalizes understanding.

## 2020-10-18 NOTE — Telephone Encounter (Signed)
Katherine [name] would like to speak to [name], about re-filling her Oxycodone 10 MG. She will be out of pain medication on tomorrow.  Call back phone [phone].

## 2020-10-23 ENCOUNTER — Encounter: Payer: Medicare Other | Attending: Physical Medicine & Rehabilitation | Admitting: Registered Nurse

## 2020-10-23 ENCOUNTER — Other Ambulatory Visit: Payer: Self-pay

## 2020-10-23 ENCOUNTER — Encounter: Payer: Self-pay | Admitting: Registered Nurse

## 2020-10-23 VITALS — BP 167/72 | HR 74 | Temp 98.3°F | Ht 61.0 in | Wt 233.0 lb

## 2020-10-23 DIAGNOSIS — M797 Fibromyalgia: Secondary | ICD-10-CM | POA: Diagnosis not present

## 2020-10-23 DIAGNOSIS — M25511 Pain in right shoulder: Secondary | ICD-10-CM | POA: Insufficient documentation

## 2020-10-23 DIAGNOSIS — G8929 Other chronic pain: Secondary | ICD-10-CM | POA: Insufficient documentation

## 2020-10-23 DIAGNOSIS — Z5181 Encounter for therapeutic drug level monitoring: Secondary | ICD-10-CM | POA: Diagnosis not present

## 2020-10-23 DIAGNOSIS — Z940 Kidney transplant status: Secondary | ICD-10-CM | POA: Diagnosis not present

## 2020-10-23 DIAGNOSIS — G894 Chronic pain syndrome: Secondary | ICD-10-CM | POA: Insufficient documentation

## 2020-10-23 DIAGNOSIS — N189 Chronic kidney disease, unspecified: Secondary | ICD-10-CM | POA: Diagnosis not present

## 2020-10-23 DIAGNOSIS — M25512 Pain in left shoulder: Secondary | ICD-10-CM | POA: Diagnosis not present

## 2020-10-23 DIAGNOSIS — M47816 Spondylosis without myelopathy or radiculopathy, lumbar region: Secondary | ICD-10-CM | POA: Insufficient documentation

## 2020-10-23 DIAGNOSIS — Z79891 Long term (current) use of opiate analgesic: Secondary | ICD-10-CM | POA: Insufficient documentation

## 2020-10-23 DIAGNOSIS — M5416 Radiculopathy, lumbar region: Secondary | ICD-10-CM | POA: Insufficient documentation

## 2020-10-23 DIAGNOSIS — M1711 Unilateral primary osteoarthritis, right knee: Secondary | ICD-10-CM | POA: Insufficient documentation

## 2020-10-23 DIAGNOSIS — M7061 Trochanteric bursitis, right hip: Secondary | ICD-10-CM | POA: Insufficient documentation

## 2020-10-23 DIAGNOSIS — M7062 Trochanteric bursitis, left hip: Secondary | ICD-10-CM | POA: Diagnosis not present

## 2020-10-23 MED ORDER — OXYCODONE HCL 10 MG PO TABS: 10.0000 mg | ORAL_TABLET | Freq: Three times a day (TID) | ORAL | 0 refills | Status: DC | PRN

## 2020-10-23 NOTE — Progress Notes (Signed)
Subjective:    Patient ID: Katherine [name], female    DOB: 11/06/1955, 64 y.o.   MRN: [id number]  HPI: Katherine [name] is a 65 y.o. female who returns for follow up appointment for chronic pain and medication refill. She states her pain is located in her bilateral shoulders, lower back pain radiating into her bilateral lower extremities and bilateral hip pain. Also reports generalized pain . She rates her pain 8. Her current exercise regime is walking and performing stretching exercises.  Katherine [name] equivalent is 45.00 MME. She is also prescribed Lorazepam  by Dr. Ronnald Ramp. We have discussed the black box warning of using opioids and benzodiazepines. I highlighted the dangers of using these drugs together and discussed the adverse events including respiratory suppression, overdose, cognitive impairment and importance of compliance with current regimen. We will continue to monitor and adjust as indicated.   UDS ordered today    Pain Inventory Average Pain 8 Pain Right Now 8 My pain is sharp, burning, dull, stabbing, tingling and aching  In the last 24 hours, has pain interfered with the following? General activity 8 Relation with others 2 Enjoyment of life 8 What TIME of day is your pain at its worst? morning  and night Sleep (in general) Good  Pain is worse with: walking, bending, inactivity and some activites Pain improves with: rest, heat/ice and medication Relief from Meds: 9  Family History  Problem Relation Age of Onset  . Cancer Mother        liver  . Heart disease Father   . Cancer Father        colon  . Arrhythmia Brother        Atrial Fibrillation  . Arrhythmia Paternal Aunt        Atrial Fibrillation   Social History   Socioeconomic History  . Marital status: Widowed    Spouse name: Not on file  . Number of children: Not on file  . Years of education: Not on file  . Highest education level: Not on file  Occupational History  . Not on file   Tobacco Use  . Smoking status: Former Smoker    Packs/day: 1.00    Years: 30.00    Pack years: 30.00    Types: Cigarettes    Quit date: 08/17/2002    Years since quitting: 18.1  . Smokeless tobacco: Never Used  Vaping Use  . Vaping Use: Never used  Substance and Sexual Activity  . Alcohol use: No  . Drug use: No  . Sexual activity: Not on file  Other Topics Concern  . Not on file  Social History Narrative   She is currently married, and the caregiver of her husband who is recovering from surgery for tongue cancer now diagnosed with lung cancer. Prior to his diagnosis of her husband, she actually had adopted a 77-year-old child who she knows caring for as well. With all the surgeries, they have been quite financially troubled. Thanks the help of her community and church, they have been able to stay "alfoat."     She is a former smoker who quit in 2004 after a 30-pack-year history.   She is active chasing a 67-year-old child, does not do routine exercise. She's been quite depressed with the condition of her husband, and admits to eating comfort herself.   She does not drink alcohol.   Social Determinants of Health   Financial Resource Strain: Not on file  Food Insecurity: No Food Insecurity  .  Worried About Charity fundraiser in the Last Year: Never true  . Ran Out of Food in the Last Year: Never true  Transportation Needs: No Transportation Needs  . Lack of Transportation (Medical): No  . Lack of Transportation (Non-Medical): No  Physical Activity: Not on file  Stress: Not on file  Social Connections: Not on file   Past Surgical History:  Procedure Laterality Date  . ABDOMINAL AORTOGRAM N/A 04/21/2018   Procedure: ABDOMINAL AORTOGRAM;  Surgeon: [name], MD;  Location: [facility];  Service: Cardiovascular;  Laterality: N/A;  . CATHETER REMOVAL    . CHOLECYSTECTOMY N/A 10/29/2014   Procedure: LAPAROSCOPIC CHOLECYSTECTOMY WITH INTRAOPERATIVE CHOLANGIOGRAM;  Surgeon:  Excell Seltzer, MD;  Location: [facility];  Service: General;  Laterality: N/A;  . CORONARY ANGIOPLASTY  1994   x5  . CORONARY ARTERY BYPASS GRAFT  1995   LIMA-LAD, SVG-RPDA, SVG-D1  . ESOPHAGOGASTRODUODENOSCOPY N/A 10/15/2016   Procedure: ESOPHAGOGASTRODUODENOSCOPY (EGD);  Surgeon: Wilford Corner, MD;  Location: [facility];  Service: Endoscopy;  Laterality: N/A;  . I & D EXTREMITY Right 01/29/2018   Procedure: IRRIGATION AND DEBRIDEMENT THUMB;  Surgeon: Dayna Barker, MD;  Location: [place];  Service: Plastics;  Laterality: Right;  . INCISE AND DRAIN ABCESS    . KIDNEY TRANSPLANT  1991  . KNEE ARTHROSCOPY WITH LATERAL MENISECTOMY Left 12/03/2017   Procedure: LEFT KNEE ARTHROSCOPY WITH LATERAL MENISECTOMY;  Surgeon: Earlie Server, MD;  Location: [place];  Service: Orthopedics;  Laterality: Left;  . LAPAROSCOPIC GASTRIC BANDING  04/2004; 10/'09, 2/'10   Port Replacement x 2  . LEFT HEART CATH AND CORS/GRAFTS ANGIOGRAPHY N/A 04/21/2018   Procedure: LEFT HEART CATH AND CORS/GRAFTS ANGIOGRAPHY;  Surgeon: [name], MD;  Location: [place] [facility];  Ost-Prox LAD 50% - proxLAD (pre & post D1) 100% CTO. Cx - patent, small OM1 (stable ~ ostial OM1 90%, too small for PCI) & 2 small LPL; Ost-distal RCA 100% CTO.  LIMA-LAD (not injected); SVG-dRCA patent, SVG-D1 - insertion stent ~20% ISR - Severe R CFA disease w/ focal Sub TO  . LEFT HEART CATH AND CORS/GRAFTS ANGIOGRAPHY  5/'01, 3/'02, 8/'03, 10/'04; 1/'15   08/22/2013: LAD & RCA 100%; LIMA-LAD & SVG-rPDA patent; Cx-- OM1 60%, OM2 ostial ~50%; SVG-D1 - 80% mid, 50% distal ISR --PCI  . LEFT HEART CATHETERIZATION WITH CORONARY/GRAFT ANGIOGRAM N/A 08/23/2013   Procedure: LEFT HEART CATHETERIZATION WITH Beatrix Fetters;  Surgeon: Wellington Hampshire, MD;  Location: [place] [facility];  Service: Cardiovascular;  Laterality: N/A;  . Lower Extremity Arterial Dopplers  08/2013   ABI: R 0.96, L 1.04  . MULTIPLE TOOTH EXTRACTIONS  age 38  . NM MYOVIEW LTD   03/2016   EF 62%. LOW RISK. C/W prior MI - no Ischemia. Apical hypokinesis.  [name] Kitchen PERCUTANEOUS CORONARY STENT INTERVENTION (PCI-S)  5/'01, 3/'02, 8/'03, 10/'04;   '01 - S660 BMS 2.5 x 9 - dSVG-D1 into D1; '02- post-stent stenosis - 2.5 x 8 Pixel BMS; '8\03: ISR/Thrombosis into native D1 - AngioJet, 2.5 x 13 Pixel; '04 - ISR 95% - covered stented area with Taxus DES 2.5 mm x 20 (2.88)  . PERCUTANEOUS CORONARY STENT INTERVENTION (PCI-S)  08/23/2013   Procedure: PERCUTANEOUS CORONARY STENT INTERVENTION (PCI-S);  Surgeon: Wellington Hampshire, MD;  Location: [facility];  Service: Cardiovascular;;mid SVG-D1 80%; distal stent ~50% ISR; Promus Prermier DES 2.75 mm xc 20 mm (2.8 mm)  . PORT-A-CATH REMOVAL     kidney  . TRANSTHORACIC ECHOCARDIOGRAM  07/2019   EF 55 to 60%.  No LVH.  Paradoxical septal wall motion-post CABG.  GRII DD.  Normal RV size and function.  Mild bilateral atrial dilation.  Moderate MAC.  Trace MR.  Mild aortic stenosis (gradients: Mean 14.3 mmHg -peak 24.9 mmHg).  . TUBAL LIGATION    . wrist fistula repair Left    dialysis for one year   Past Surgical History:  Procedure Laterality Date  . ABDOMINAL AORTOGRAM N/A 04/21/2018   Procedure: ABDOMINAL AORTOGRAM;  Surgeon: [name], MD;  Location: [place] [facility];  Service: Cardiovascular;  Laterality: N/A;  . CATHETER REMOVAL    . CHOLECYSTECTOMY N/A 10/29/2014   Procedure: LAPAROSCOPIC CHOLECYSTECTOMY WITH INTRAOPERATIVE CHOLANGIOGRAM;  Surgeon: Excell Seltzer, MD;  [facility]: [facility];  Service: General;  Laterality: N/A;  . CORONARY ANGIOPLASTY  1994   x5  . CORONARY ARTERY BYPASS GRAFT  1995   LIMA-LAD, SVG-RPDA, SVG-D1  . ESOPHAGOGASTRODUODENOSCOPY N/A 10/15/2016   Procedure: ESOPHAGOGASTRODUODENOSCOPY (EGD);  Surgeon: Wilford Corner, MD;  Location: [facility];  Service: Endoscopy;  Laterality: N/A;  . I & D EXTREMITY Right 01/29/2018   Procedure: IRRIGATION AND DEBRIDEMENT THUMB;  Surgeon: Dayna Barker, MD;  Location:  [place];  Service: Plastics;  Laterality: Right;  . INCISE AND DRAIN ABCESS    . KIDNEY TRANSPLANT  1991  . KNEE ARTHROSCOPY WITH LATERAL MENISECTOMY Left 12/03/2017   Procedure: LEFT KNEE ARTHROSCOPY WITH LATERAL MENISECTOMY;  Surgeon: Earlie Server, MD;  Location: [place];  Service: Orthopedics;  Laterality: Left;  . LAPAROSCOPIC GASTRIC BANDING  04/2004; 10/'09, 2/'10   Port Replacement x 2  . LEFT HEART CATH AND CORS/GRAFTS ANGIOGRAPHY N/A 04/21/2018   Procedure: LEFT HEART CATH AND CORS/GRAFTS ANGIOGRAPHY;  Surgeon: [name], MD;  Location: [place] [facility];  Ost-Prox LAD 50% - proxLAD (pre & post D1) 100% CTO. Cx - patent, small OM1 (stable ~ ostial OM1 90%, too small for PCI) & 2 small LPL; Ost-distal RCA 100% CTO.  LIMA-LAD (not injected); SVG-dRCA patent, SVG-D1 - insertion stent ~20% ISR - Severe R CFA disease w/ focal Sub TO  . LEFT HEART CATH AND CORS/GRAFTS ANGIOGRAPHY  5/'01, 3/'02, 8/'03, 10/'04; 1/'15   08/22/2013: LAD & RCA 100%; LIMA-LAD & SVG-rPDA patent; Cx-- OM1 60%, OM2 ostial ~50%; SVG-D1 - 80% mid, 50% distal ISR --PCI  . LEFT HEART CATHETERIZATION WITH CORONARY/GRAFT ANGIOGRAM N/A 08/23/2013   Procedure: LEFT HEART CATHETERIZATION WITH Beatrix Fetters;  Surgeon: Wellington Hampshire, MD;  Location: [place] CATH [facility];  Service: Cardiovascular;  Laterality: N/A;  . Lower Extremity Arterial Dopplers  08/2013   ABI: R 0.96, L 1.04  . MULTIPLE TOOTH EXTRACTIONS  age 31  . NM MYOVIEW LTD  03/2016   EF 62%. LOW RISK. C/W prior MI - no Ischemia. Apical hypokinesis.  [name] Kitchen PERCUTANEOUS CORONARY STENT INTERVENTION (PCI-S)  5/'01, 3/'02, 8/'03, 10/'04;   '01 - S660 BMS 2.5 x 9 - dSVG-D1 into D1; '02- post-stent stenosis - 2.5 x 8 Pixel BMS; '8\03: ISR/Thrombosis into native D1 - AngioJet, 2.5 x 13 Pixel; '04 - ISR 95% - covered stented area with Taxus DES 2.5 mm x 20 (2.88)  . PERCUTANEOUS CORONARY STENT INTERVENTION (PCI-S)  08/23/2013   Procedure: PERCUTANEOUS CORONARY STENT INTERVENTION  (PCI-S);  Surgeon: Wellington Hampshire, MD;  Location: [facility] CATH LAB;  Service: Cardiovascular;;mid SVG-D1 80%; distal stent ~50% ISR; Promus Prermier DES 2.75 mm xc 20 mm (2.8 mm)  . PORT-A-CATH REMOVAL  kidney  . TRANSTHORACIC ECHOCARDIOGRAM  07/2019   EF 55 to 60%.  No LVH.  Paradoxical septal wall motion-post CABG.  GRII DD.  Normal RV size and function.  Mild bilateral atrial dilation.  Moderate MAC.  Trace MR.  Mild aortic stenosis (gradients: Mean 14.3 mmHg -peak 24.9 mmHg).  . TUBAL LIGATION    . wrist fistula repair Left    dialysis for one year   Past Medical History:  Diagnosis Date  . Anemia   . Anxiety   . Bilateral carotid artery stenosis    Carotid duplex 09/4578: 9-98% LICA, 33-82% RICA, >50% RECA, f/u 1 yr suggested  . CAD (coronary artery disease) of bypass graft 5/01; 3/'02, 8/'03, 10/'04; 1/15   PCI x 5 to SVG-D1   . CAD in native artery 07/1993   3 Vessel Disease (LAD-D1 & RCA) -- CABG (Dx in setting of inferior STEMI-PTCA of RCA)  . CAD S/P percutaneous coronary angioplasty    PCI to SVG-D1 insertion/native D1 x 4 = '01 -(S660 BMS 2.5 x 9 anastomosis- D1); '02 - distal overlap ACS Pixel 2.5 x 8  BMS; '03 distal/native ISR/Thrombosis - Pixel 2.5 x 13; '04 - ISR-  Taxus 2.5 x 20 (covered all);; 1/15 - mid SVG-D1 (50% distal ISR) - Promus P 2.75 x 20 -- 2.8 mm  . COPD mixed type ([place])    Followed by Dr. Lamonte Sakai "pulmonologist said no COPD"  . Depression   . Depression with anxiety   . Diabetes mellitus type 2 in obese ([place])   . Diarrhea    started after cholecystectomy and mass removed from intestine  . Dyslipidemia, goal LDL below 70    08/2012: TC 137, TG 200, HDL 32!, LDL 45; on statin (followed by Dr.Deterding)  . ESRD (end stage renal disease) ([place]) 1991   s/p Cadaveric Renal Transplant [facility] - Dr. Jimmy Footman)   . Family history of adverse reaction to anesthesia    mom's bp dropped during/after anesthesia  . Fibromyalgia   . GERD (gastroesophageal reflux disease)    . Glomerulonephritis, chronic, rapidly progressive 55  . 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 07/1993   Rescue PTCA of RCA -- referred for CABG.  . H/O: GI bleed   . Headache    migraines in the past  . History of CABG x 3 08/1993   Dr. Servando Snare: LIMA-LAD, SVG-bifurcatingD1, SVG-rPDA  . History of kidney stones   . History of stroke 2012   "right eye stroke- half blind now"  . Hypertension associated with diabetes ([place])   . Mild aortic stenosis by prior echocardiogram 07/2019   Echo:  Mild aortic stenosis (gradients: Mean 14.3 mmHg -peak 24.9 mmHg).  . Morbid obesity ([place])   . MRSA (methicillin resistant staph aureus) culture positive   . OSA (obstructive sleep apnea)    no longer on CPAP or home O2, states she doesn't need now after lap band  . PAD (peripheral artery disease) ([place]) 08/2013   LEA Dopplers to be read by Dr. Fletcher Anon  . PAF (paroxysmal atrial fibrillation) ([place]) 06/2014   Noted on CardioNet Monitor  - --> rhythm control with Tikosyn (Dr. Rayann Heman); converted from warfarin to apixaban for anticoagulation.  . Pneumonia   . Recurrent boils    Bilateral Groin  . Rheumatoid arthritis ([place])    Per Patient Report; associated with OA  . S/p cadaver renal transplant [place]  . Unstable angina ([place]) 5/01; 3/'02, 8/'03, 10/'04; 1/15  x 5 occurences since Inf-Post STEMI in 1994   BP (!) 167/72   Pulse 74   Temp 98.3 F (36.8 C)   Ht _0  (1.549 m)   Wt 233 lb (105.7 kg)   SpO2 91%   BMI 44.02 kg/m   Opioid Risk Score:   Fall Risk Score:  `1  Depression screen PHQ 2/9  Depression screen [facility] 2/9 01/04/2020 11/02/2018  Decreased Interest 0 3  Down, Depressed, Hopeless 1 3  PHQ - 2 Score 1 6  Altered sleeping - 1  Tired, decreased energy - 1  Change in appetite - 1  Feeling bad or failure about yourself  - 0  Trouble concentrating - 0  Moving slowly or fidgety/restless - 0  Suicidal thoughts - 0  PHQ-9 Score - 9  Some  recent data might be hidden    Review of Systems  Constitutional: Positive for fatigue.  HENT: Negative.   Eyes: Negative.   Respiratory: Positive for shortness of breath.   Cardiovascular: Negative.   Gastrointestinal: Negative.   Genitourinary: Negative.   Musculoskeletal: Positive for arthralgias, back pain, gait problem, myalgias, neck pain and neck stiffness.  Hematological: Negative.   All other systems reviewed and are negative.      Objective:   Physical Exam Vitals and nursing note reviewed.  Constitutional:      Appearance: Normal appearance.  Cardiovascular:     Rate and Rhythm: Normal rate and regular rhythm.     Pulses: Normal pulses.     Heart sounds: Normal heart sounds.  Pulmonary:     Effort: Pulmonary effort is normal.     Breath sounds: Normal breath sounds.  Musculoskeletal:     Cervical back: Normal range of motion and neck supple.     Comments: Normal Muscle Bulk and Muscle Testing Reveals:  Upper Extremities:Full  ROM and Muscle Strength 5/5 Bilateral AC Joint Tenderness  Thoracic, and Lumbar Hypersensitivity Bilateral Greater Trochanter Tenderness Lower Extremities: Right Lower Extremity: Full ROM and Muscle Strength 5/5 Left Lower Extremity : Decreased ROM and Muscle Strength 4/5  Left Lower Extremity Flexion Produces Pain into her Left Lower Extremity Arises from chair slowly using walker for support Narrow based  Gait   Skin:    General: Skin is warm and dry.  Neurological:     Mental Status: She is alert and oriented to person, place, and time.  Psychiatric:        Mood and Affect: Mood normal.        Behavior: Behavior normal.           Assessment & Plan:  1. Fibromyalgia: Continue with HEP as tolerated. Continue Tegretol and Cymbalta. 10/23/2020 2. Rheumatoid Arthritis: Refilled: Oxycodone21m one tablet every 8 hours as needed for severe pain #80. Continueto Monitor. 10/23/2020 3. Osteoarthritis--polyarticular: Continue with  current medication regime and continue to monitor.10/23/2020 4. ESRD with hx of renal transplant on chronic steroids: Nephrology Following.10/23/2020. 5. Bilateral Shoulder pain/Right rotator cuff tendonitis/like degenerative/inflammatory changes in the right shoulder also. Continue HEP and Continue to Monitor.10/23/2020. 6. Muscle Spasm: Continue Flexeril. 10/23/2020 7. Depression:Continue current medication regimen. Continue to monitor.10/23/2020. 8.Lumbar Radiculitis: Previous Left 405 Transformanial ESI with relief noted. Continue HEP as Tolerated.Continue to Monitor.10/23/2020. 9.Bilateral Greater Trochanteric Bursitis: Continue HEP alternate Ice and Heat Therapy. 10/23/2020. 10. Type 2 DM with Peripheral Neuropathy: Continue Tegretol. 10/23/2020  F/U in 2 Months. Katherine [name] like to see Dr SNaaman Plummer

## 2020-10-29 ENCOUNTER — Ambulatory Visit: Payer: Medicare Other | Admitting: Physical Therapy

## 2020-10-30 ENCOUNTER — Other Ambulatory Visit: Payer: Self-pay | Admitting: Emergency Medicine

## 2020-10-30 ENCOUNTER — Encounter (HOSPITAL_COMMUNITY): Payer: Self-pay | Admitting: Emergency Medicine

## 2020-10-30 ENCOUNTER — Other Ambulatory Visit: Payer: Self-pay | Admitting: Registered Nurse

## 2020-10-30 ENCOUNTER — Other Ambulatory Visit: Payer: Self-pay | Admitting: Physical Medicine & Rehabilitation

## 2020-10-30 ENCOUNTER — Emergency Department (HOSPITAL_COMMUNITY): Payer: Medicare Other

## 2020-10-30 ENCOUNTER — Other Ambulatory Visit: Payer: Self-pay | Admitting: Cardiology

## 2020-10-30 ENCOUNTER — Emergency Department (HOSPITAL_COMMUNITY)
Admission: EM | Admit: 2020-10-30 | Discharge: 2020-10-30 | Disposition: A | Discharge status: Home / Self Care | Payer: Medicare Other | Attending: Emergency Medicine | Admitting: Emergency Medicine

## 2020-10-30 DIAGNOSIS — Z7901 Long term (current) use of anticoagulants: Secondary | ICD-10-CM | POA: Insufficient documentation

## 2020-10-30 DIAGNOSIS — J811 Chronic pulmonary edema: Secondary | ICD-10-CM | POA: Diagnosis not present

## 2020-10-30 DIAGNOSIS — I517 Cardiomegaly: Secondary | ICD-10-CM | POA: Diagnosis not present

## 2020-10-30 DIAGNOSIS — R0902 Hypoxemia: Secondary | ICD-10-CM | POA: Diagnosis not present

## 2020-10-30 DIAGNOSIS — I25118 Atherosclerotic heart disease of native coronary artery with other forms of angina pectoris: Secondary | ICD-10-CM | POA: Insufficient documentation

## 2020-10-30 DIAGNOSIS — Z87891 Personal history of nicotine dependence: Secondary | ICD-10-CM | POA: Insufficient documentation

## 2020-10-30 DIAGNOSIS — I10 Essential (primary) hypertension: Secondary | ICD-10-CM | POA: Diagnosis not present

## 2020-10-30 DIAGNOSIS — I480 Paroxysmal atrial fibrillation: Secondary | ICD-10-CM | POA: Diagnosis not present

## 2020-10-30 DIAGNOSIS — E1140 Type 2 diabetes mellitus with diabetic neuropathy, unspecified: Secondary | ICD-10-CM | POA: Insufficient documentation

## 2020-10-30 DIAGNOSIS — Z794 Long term (current) use of insulin: Secondary | ICD-10-CM | POA: Insufficient documentation

## 2020-10-30 DIAGNOSIS — Z951 Presence of aortocoronary bypass graft: Secondary | ICD-10-CM | POA: Insufficient documentation

## 2020-10-30 DIAGNOSIS — R55 Syncope and collapse: Secondary | ICD-10-CM | POA: Diagnosis not present

## 2020-10-30 DIAGNOSIS — N186 End stage renal disease: Secondary | ICD-10-CM | POA: Diagnosis not present

## 2020-10-30 DIAGNOSIS — Z940 Kidney transplant status: Secondary | ICD-10-CM | POA: Diagnosis not present

## 2020-10-30 DIAGNOSIS — J45909 Unspecified asthma, uncomplicated: Secondary | ICD-10-CM | POA: Diagnosis not present

## 2020-10-30 DIAGNOSIS — J449 Chronic obstructive pulmonary disease, unspecified: Secondary | ICD-10-CM | POA: Diagnosis not present

## 2020-10-30 DIAGNOSIS — M797 Fibromyalgia: Secondary | ICD-10-CM

## 2020-10-30 DIAGNOSIS — E1122 Type 2 diabetes mellitus with diabetic chronic kidney disease: Secondary | ICD-10-CM | POA: Insufficient documentation

## 2020-10-30 DIAGNOSIS — Z79899 Other long term (current) drug therapy: Secondary | ICD-10-CM | POA: Insufficient documentation

## 2020-10-30 DIAGNOSIS — Z7984 Long term (current) use of oral hypoglycemic drugs: Secondary | ICD-10-CM | POA: Diagnosis not present

## 2020-10-30 DIAGNOSIS — R001 Bradycardia, unspecified: Secondary | ICD-10-CM | POA: Diagnosis not present

## 2020-10-30 DIAGNOSIS — R0602 Shortness of breath: Secondary | ICD-10-CM | POA: Diagnosis not present

## 2020-10-30 DIAGNOSIS — R42 Dizziness and giddiness: Secondary | ICD-10-CM | POA: Diagnosis not present

## 2020-10-30 DIAGNOSIS — E1142 Type 2 diabetes mellitus with diabetic polyneuropathy: Secondary | ICD-10-CM

## 2020-10-30 DIAGNOSIS — I120 Hypertensive chronic kidney disease with stage 5 chronic kidney disease or end stage renal disease: Secondary | ICD-10-CM | POA: Insufficient documentation

## 2020-10-30 LAB — CBC
HCT: 36.5 % (ref 36.0–46.0)
Hemoglobin: 11.4 g/dL — ABNORMAL LOW (ref 12.0–15.0)
MCH: 30.4 pg (ref 26.0–34.0)
MCHC: 31.2 g/dL (ref 30.0–36.0)
MCV: 97.3 fL (ref 80.0–100.0)
Platelets: 261 10*3/uL (ref 150–400)
RBC: 3.75 MIL/uL — ABNORMAL LOW (ref 3.87–5.11)
RDW: 15 % (ref 11.5–15.5)
WBC: 8.1 10*3/uL (ref 4.0–10.5)
nRBC: 0 % (ref 0.0–0.2)

## 2020-10-30 LAB — TROPONIN I (HIGH SENSITIVITY): Troponin I (High Sensitivity): 18 ng/L — ABNORMAL HIGH (ref <18)

## 2020-10-30 LAB — CBG MONITORING, ED: Glucose-Capillary: 211 mg/dL — ABNORMAL HIGH (ref 70–99)

## 2020-10-30 LAB — BASIC METABOLIC PANEL
Anion gap: 13 (ref 5–15)
BUN: 15 mg/dL (ref 8–23)
CO2: 24 mmol/L (ref 22–32)
Calcium: 9.6 mg/dL (ref 8.9–10.3)
Chloride: 96 mmol/L — ABNORMAL LOW (ref 98–111)
Creatinine, Ser: 1.17 mg/dL — ABNORMAL HIGH (ref 0.44–1.00)
GFR, Estimated: 52 mL/min — ABNORMAL LOW (ref >60)
Glucose, Bld: 323 mg/dL — ABNORMAL HIGH (ref 70–99)
Potassium: 4.3 mmol/L (ref 3.5–5.1)
Sodium: 133 mmol/L — ABNORMAL LOW (ref 135–145)

## 2020-10-30 LAB — BRAIN NATRIURETIC PEPTIDE: B Natriuretic Peptide: 300.3 pg/mL — ABNORMAL HIGH (ref 0.0–100.0)

## 2020-10-30 LAB — URINALYSIS, ROUTINE W REFLEX MICROSCOPIC
Bilirubin Urine: NEGATIVE
Glucose, UA: 50 mg/dL — AB
Hgb urine dipstick: NEGATIVE
Ketones, ur: NEGATIVE mg/dL
Leukocytes,Ua: NEGATIVE
Nitrite: NEGATIVE
Protein, ur: NEGATIVE mg/dL
Specific Gravity, Urine: 1.009 (ref 1.005–1.030)
pH: 7 (ref 5.0–8.0)

## 2020-10-30 MED ORDER — INSULIN ASPART 100 UNIT/ML ~~LOC~~ SOLN: 10.0000 [IU] | Freq: Once | SUBCUTANEOUS | Status: DC

## 2020-10-30 MED ORDER — OXYCODONE-ACETAMINOPHEN 5-325 MG PO TABS
1.0000 | ORAL_TABLET | Freq: Once | ORAL | Status: AC
  Administered 2020-10-30: 1 via ORAL
  Filled 2020-10-30: qty 1

## 2020-10-30 NOTE — ED Provider Notes (Signed)
[facility] EMERGENCY DEPARTMENT Provider Note   CSN: [id number] Arrival date & time: 10/30/20  1636     History Chief Complaint  Patient presents with  . Shortness of Breath    Victory Dresden is a 65 y.o. female.  HPI Patient is a 65 year old female with past medical history of morbid obesity s/p laparoscopic band, CAD s/p CABG, dyslipidemia, HTN, paroxysmal A. fib, DM 2, aortic stenosis, chronic cough, OSA, anemia  Patient brought to the ER today initially triaged as a patient experiencing shortness of breath however she states to me that her shortness of breath is chronic and is unchanged today.  She states that she is primarily here because she feels that her blood pressure medications are making her feel dizzy.  She states she has been on them for quite some time.  However she feels that whenever they are changed today because her dizziness get worse.  She describes the dizziness as intermittent seems to be worse when she stands up.  She denies any episodes since she arrived in ER including when she walked around the emergency department.  She denies any chest pain, fevers, chills, headache, vision changes, focal weakness or numbness.  No other associate symptoms.    Past Medical History:  Diagnosis Date  . Anemia   . Anxiety   . Bilateral carotid artery stenosis    Carotid duplex 11/812: 4-81% LICA, 85-63% RICA, >14% RECA, f/u 1 yr suggested  . CAD (coronary artery disease) of bypass graft 5/01; 3/'02, 8/'03, 10/'04; 1/15   PCI x 5 to SVG-D1   . CAD in native artery 07/1993   3 Vessel Disease (LAD-D1 & RCA) -- CABG (Dx in setting of inferior STEMI-PTCA of RCA)  . CAD S/P percutaneous coronary angioplasty    PCI to SVG-D1 insertion/native D1 x 4 = '01 -(S660 BMS 2.5 x 9 anastomosis- D1); '02 - distal overlap ACS Pixel 2.5 x 8  BMS; '03 distal/native ISR/Thrombosis - Pixel 2.5 x 13; '04 - ISR-  Taxus 2.5 x 20 (covered all);; 1/15 - mid SVG-D1 (50% distal  ISR) - Promus P 2.75 x 20 -- 2.8 mm  . COPD mixed type ([place])    Followed by Dr. Lamonte Sakai "pulmonologist said no COPD"  . Depression   . Depression with anxiety   . Diabetes mellitus type 2 in obese ([place])   . Diarrhea    started after cholecystectomy and mass removed from intestine  . Dyslipidemia, goal LDL below 70    08/2012: TC 137, TG 200, HDL 32!, LDL 45; on statin (followed by Dr.Deterding)  . ESRD (end stage renal disease) ([place]) 1991   s/p Cadaveric Renal Transplant [facility] - Dr. Jimmy Footman)   . Family history of adverse reaction to anesthesia    mom's bp dropped during/after anesthesia  . Fibromyalgia   . GERD (gastroesophageal reflux disease)   . Glomerulonephritis, chronic, rapidly progressive 13  . 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 07/1993   Rescue PTCA of RCA -- referred for CABG.  . H/O: GI bleed   . Headache    migraines in the past  . History of CABG x 3 08/1993   Dr. Servando Snare: LIMA-LAD, SVG-bifurcatingD1, SVG-rPDA  . History of kidney stones   . History of stroke 2012   "right eye stroke- half blind now"  . Hypertension associated with diabetes ([place])   . Mild aortic stenosis by prior echocardiogram 07/2019   Echo:  Mild aortic stenosis (gradients: Mean  14.3 mmHg -peak 24.9 mmHg).  . Morbid obesity ([place])   . MRSA (methicillin resistant staph aureus) culture positive   . OSA (obstructive sleep apnea)    no longer on CPAP or home O2, states she doesn't need now after lap band  . PAD (peripheral artery disease) ([place]) 08/2013   LEA Dopplers to be read by Dr. Fletcher Anon  . PAF (paroxysmal atrial fibrillation) ([place]) 06/2014   Noted on CardioNet Monitor  - --> rhythm control with Tikosyn (Dr. Rayann Heman); converted from warfarin to apixaban for anticoagulation.  . Pneumonia   . Recurrent boils    Bilateral Groin  . Rheumatoid arthritis ([place])    Per Patient Report; associated with OA  . S/p cadaver renal transplant [place]  . Unstable  angina ([place]) 5/01; 3/'02, 8/'03, 10/'04; 1/15   x 5 occurences since Inf-Post STEMI in 1994    Patient Active Problem List   Diagnosis Date Noted  . Flu vaccine need 08/13/2020  . Need for pneumococcal vaccination 08/13/2020  . Visit for screening mammogram 08/13/2020  . Candidal skin infection 08/13/2020  . GAD (generalized anxiety disorder) 08/13/2020  . Chronic bronchitis, mucopurulent ([place]) 08/24/2019  . Current chronic use of systemic steroids 07/23/2019  . OSA (obstructive sleep apnea)   . Severe episode of recurrent major depressive disorder, with psychotic features ([place]) 02/16/2019  . Obstructive chronic bronchitis without exacerbation ([place]) 11/03/2018  . Long term (current) use of anticoagulants 04/28/2018  . Renal transplant, status post 01/28/2018  . Primary osteoarthritis of right knee 08/18/2017  . Mild aortic stenosis by prior echocardiogram 01/28/2017  . 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 Deficiency anemia 10/14/2016  . Normocytic anemia 10/13/2016  . Fibromyalgia 03/30/2016  . Other spondylosis with radiculopathy, lumbar region 03/30/2016  . Type 2 diabetes mellitus with peripheral neuropathy ([place]) 03/30/2016  . Paroxysmal atrial fibrillation ([place]); CHA2DS2VASc score F, HTN, CAD, CVA = 5 06/28/2014  . Hypertension associated with diabetes ([place])   . PAD (peripheral artery disease) ([place]) 08/17/2013  . Stenosis of right carotid artery without infarction 10/08/2012  . Dyslipidemia, goal LDL below 70 10/08/2012    Class: Diagnosis of  . Mitral annular calcification 10/08/2012  . Hypomagnesemia 06/13/2012  . Renal transplant disorder 06/12/2012  . Chronic allergic rhinitis 04/29/2011  . Extrinsic asthma 09/09/2007  . GERD 09/09/2007  . COUGH, CHRONIC 09/09/2007  . Morbid obesity - s/p Lap Band 9/'05 05/07/2004    Class: Diagnosis of  . S/P CABG (coronary artery bypass graft) x 3 09/07/1993    Class: History of  . H/O ST elevation myocardial infarction (STEMI) of  inferoposterior wall 07/1993  . Coronary artery disease involving native coronary artery of native heart with angina pectoris ([place]) 07/1993  . ESRD (end stage renal disease) ([place]) 1991    Past Surgical History:  Procedure Laterality Date  . ABDOMINAL AORTOGRAM N/A 04/21/2018   Procedure: ABDOMINAL AORTOGRAM;  Surgeon: [name], MD;  Location: [facility] CV LAB;  Service: Cardiovascular;  Laterality: N/A;  . CATHETER REMOVAL    . CHOLECYSTECTOMY N/A 10/29/2014   Procedure: LAPAROSCOPIC CHOLECYSTECTOMY WITH INTRAOPERATIVE CHOLANGIOGRAM;  Surgeon: Excell Seltzer, MD;  Location: [facility];  Service: General;  Laterality: N/A;  . CORONARY ANGIOPLASTY  1994   x5  . CORONARY ARTERY BYPASS GRAFT  1995   LIMA-LAD, SVG-RPDA, SVG-D1  . ESOPHAGOGASTRODUODENOSCOPY N/A 10/15/2016   Procedure: ESOPHAGOGASTRODUODENOSCOPY (EGD);  Surgeon: Wilford Corner, MD;  Location: [facility] ENDOSCOPY;  Service: Endoscopy;  Laterality: N/A;  . I &  D EXTREMITY Right 01/29/2018   Procedure: IRRIGATION AND DEBRIDEMENT THUMB;  Surgeon: Dayna Barker, MD;  Location: [facility];  Service: Plastics;  Laterality: Right;  . INCISE AND DRAIN ABCESS    . KIDNEY TRANSPLANT  1991  . KNEE ARTHROSCOPY WITH LATERAL MENISECTOMY Left 12/03/2017   Procedure: LEFT KNEE ARTHROSCOPY WITH LATERAL MENISECTOMY;  Surgeon: Earlie Server, MD;  Location: [facility];  Service: Orthopedics;  Laterality: Left;  . LAPAROSCOPIC GASTRIC BANDING  04/2004; 10/'09, 2/'10   Port Replacement x 2  . LEFT HEART CATH AND CORS/GRAFTS ANGIOGRAPHY N/A 04/21/2018   Procedure: LEFT HEART CATH AND CORS/GRAFTS ANGIOGRAPHY;  Surgeon: [name], MD;  Location: [facility] CV LAB;  Ost-Prox LAD 50% - proxLAD (pre & post D1) 100% CTO. Cx - patent, small OM1 (stable ~ ostial OM1 90%, too small for PCI) & 2 small LPL; Ost-distal RCA 100% CTO.  LIMA-LAD (not injected); SVG-dRCA patent, SVG-D1 - insertion stent ~20% ISR - Severe R CFA disease w/ focal Sub TO  . LEFT HEART CATH AND  CORS/GRAFTS ANGIOGRAPHY  5/'01, 3/'02, 8/'03, 10/'04; 1/'15   08/22/2013: LAD & RCA 100%; LIMA-LAD & SVG-rPDA patent; Cx-- OM1 60%, OM2 ostial ~50%; SVG-D1 - 80% mid, 50% distal ISR --PCI  . LEFT HEART CATHETERIZATION WITH CORONARY/GRAFT ANGIOGRAM N/A 08/23/2013   Procedure: LEFT HEART CATHETERIZATION WITH Beatrix Fetters;  Surgeon: Wellington Hampshire, MD;  Location: [facility] CATH LAB;  Service: Cardiovascular;  Laterality: N/A;  . Lower Extremity Arterial Dopplers  08/2013   ABI: R 0.96, L 1.04  . MULTIPLE TOOTH EXTRACTIONS  age 44  . NM MYOVIEW LTD  03/2016   EF 62%. LOW RISK. C/W prior MI - no Ischemia. Apical hypokinesis.  [name] Kitchen PERCUTANEOUS CORONARY STENT INTERVENTION (PCI-S)  5/'01, 3/'02, 8/'03, 10/'04;   '01 - S660 BMS 2.5 x 9 - dSVG-D1 into D1; '02- post-stent stenosis - 2.5 x 8 Pixel BMS; '8\03: ISR/Thrombosis into native D1 - AngioJet, 2.5 x 13 Pixel; '04 - ISR 95% - covered stented area with Taxus DES 2.5 mm x 20 (2.88)  . PERCUTANEOUS CORONARY STENT INTERVENTION (PCI-S)  08/23/2013   Procedure: PERCUTANEOUS CORONARY STENT INTERVENTION (PCI-S);  Surgeon: Wellington Hampshire, MD;  Location: [facility] CATH LAB;  Service: Cardiovascular;;mid SVG-D1 80%; distal stent ~50% ISR; Promus [place] DES 2.75 mm xc 20 mm (2.8 mm)  . PORT-A-CATH REMOVAL     kidney  . TRANSTHORACIC ECHOCARDIOGRAM  07/2019   EF 55 to 60%.  No LVH.  Paradoxical septal wall motion-post CABG.  GRII DD.  Normal RV size and function.  Mild bilateral atrial dilation.  Moderate MAC.  Trace MR.  Mild aortic stenosis (gradients: Mean 14.3 mmHg -peak 24.9 mmHg).  . TUBAL LIGATION    . wrist fistula repair Left    dialysis for one year     OB History   No obstetric history on file.     Family History  Problem Relation Age of Onset  . Cancer Mother        liver  . Heart disease Father   . Cancer Father        colon  . Arrhythmia Brother        Atrial Fibrillation  . Arrhythmia Paternal Aunt        Atrial Fibrillation    Social  History   Tobacco Use  . Smoking status: Former Smoker    Packs/day: 1.00    Years: 30.00    Pack years: 30.00    Types:  Cigarettes    Quit date: 08/17/2002    Years since quitting: 18.2  . Smokeless tobacco: Never Used  Vaping Use  . Vaping Use: Never used  Substance Use Topics  . Alcohol use: No  . Drug use: No    Home Medications Prior to Admission medications   Medication Sig Start Date End Date Taking? Authorizing Provider  acetaminophen (TYLENOL) 500 MG tablet Take 1,500 mg by mouth daily.    [provider]  albuterol (PROVENTIL) (2.5 MG/3ML) 0.083% nebulizer solution Take 3 mLs (2.5 mg total) by nebulization every 6 (six) hours as needed for wheezing or shortness of breath (dx: J45.20). 03/15/19   Parrett, Fonnie Mu, NP  albuterol (VENTOLIN HFA) 108 (90 Base) MCG/ACT inhaler Inhale 2 puffs into the lungs every 6 (six) hours as needed for wheezing or shortness of breath. 10/30/20   Collene Gobble, MD  arformoterol (BROVANA) 15 MCG/2ML NEBU Take 2 mLs (15 mcg total) by nebulization 2 (two) times daily. Patient taking differently: Take 15 mcg by nebulization 2 (two) times daily. Mix with budesonide 08/30/19   Collene Gobble, MD  azaTHIOprine (IMURAN) 50 MG tablet Take 125 mg by mouth daily.     [provider]  B-D ULTRAFINE III SHORT PEN 31G X 8 MM MISC See admin instructions. 11/06/19   [provider]  Blood Glucose Monitoring Suppl (ONE TOUCH ULTRA 2) w/Device KIT 2 (two) times daily. 04/08/20   [provider]  budesonide (PULMICORT) 0.5 MG/2ML nebulizer solution Take 2 mLs (0.5 mg total) by nebulization 2 (two) times daily. Patient taking differently: Take 2 mLs by nebulization 2 (two) times daily. Mix with Garlon Hatchet 08/24/19   [name], MD  calcitRIOL (ROCALTROL) 0.25 MCG capsule Take 0.25 mcg by mouth every 3 (three) days.     [provider]  clotrimazole (MYCELEX) 10 MG troche Take 1 tablet (10 mg total) by mouth 5 (five) times  daily. Patient taking differently: Take 10 mg by mouth 5 (five) times daily as needed (thrush). 11/28/18   Parrett, Fonnie Mu, NP  cyclobenzaprine (FLEXERIL) 10 MG tablet Take 1-2 tablets (10-20 mg total) by mouth See admin instructions. Take two tablets (20 mg) by mouth daily at bedtime, may also take one tablet (10 mg) at noon as needed for muscle spasms 08/19/20   Meredith Staggers, MD  diltiazem (CARDIZEM CD) 120 MG 24 hr capsule TAKE 1 CAPSULE BY MOUTH EVERY DAY Patient taking differently: Take 120 mg by mouth daily. 12/07/19   [name], MD  diltiazem (CARDIZEM) 30 MG tablet TAKE 1 TABLET EVERY 4 HOURS AS NEEDED FOR AFIB HEART RATE >100 10/14/20   Sherran Needs, NP  dofetilide (TIKOSYN) 250 MCG capsule Take 1 capsule (250 mcg total) by mouth 2 (two) times daily. 08/13/20   [name], MD  DULoxetine (CYMBALTA) 30 MG capsule TAKE 2 CAPSULES (60 MG TOTAL) BY MOUTH EVERY MORNING. 10/30/20   Bayard Hugger, NP  ELIQUIS 5 MG TABS tablet TAKE 1 TABLET BY MOUTH TWICE A DAY (TO REPLACE WARFARIN) 10/02/20   [name], MD  fluticasone [facility]) 50 MCG/ACT nasal spray PLACE 2 SPRAYS INTO BOTH NOSTRILS 2 TIMES DAILY 10/08/20   Collene Gobble, MD  furosemide (LASIX) 40 MG tablet Take 1 tablet (40 mg total) by mouth daily. 07/14/20   Dwyane Dee, MD  glimepiride (AMARYL) 4 MG tablet Take 4 mg by mouth daily. 02/03/20   [provider]  insulin aspart (NOVOLOG  FLEXPEN) 100 UNIT/ML FlexPen Inject 10 Units into the skin 2 (two) times daily after a meal.    [provider]  Insulin Glargine (BASAGLAR KWIKPEN) 100 UNIT/ML SOPN Inject 12 Units into the skin daily before breakfast.     [provider]  lamoTRIgine (LAMICTAL) 25 MG tablet TAKE 1 TABLET BY MOUTH EVERYDAY AT BEDTIME Patient taking differently: Take 25 mg by mouth at bedtime. 06/03/20   Meredith Staggers, MD  Lancets [facility] DELICA PLUS [id number]) MISC daily. as directed 03/26/20   [provider]   LORazepam (ATIVAN) 1 MG tablet Take 1 tablet (1 mg total) by mouth 2 (two) times daily. 09/12/20   [name], MD  metFORMIN (GLUCOPHAGE) 500 MG tablet Take 500 mg by mouth 2 (two) times daily. 10/03/16   [provider]  metoprolol tartrate (LOPRESSOR) 50 MG tablet TAKE 1 TABLET BY MOUTH TWICE A DAY Patient taking differently: Take 50 mg by mouth See admin instructions. Take one tablet (50 mg) by mouth twice daily, may also take one tablet (50 mg) midday as needed for SBP >150 05/09/20   Sherran Needs, NP  montelukast (SINGULAIR) 10 MG tablet Take 1 tablet (10 mg total) by mouth at bedtime. 10/30/20   Collene Gobble, MD  nitroGLYCERIN (NITROSTAT) 0.4 MG SL tablet Place 1 tablet (0.4 mg total) under the tongue every 5 (five) minutes as needed for chest pain. 09/11/13   [name], MD  omeprazole (PRILOSEC) 20 MG capsule Take 20 mg by mouth daily.    [provider]  [facility] ULTRA test strip USE AS DIRECTED 3 TIMES DAILY TO CHECK BLOOD SUGAR 11/04/19   [provider]  Oxycodone HCl 10 MG TABS Take 1 tablet (10 mg total) by mouth 3 (three) times daily as needed. 10/23/20   Bayard Hugger, NP  OXYGEN Inhale 3 L into the lungs continuous.    [provider]  predniSONE (DELTASONE) 5 MG tablet Take 5 mg by mouth daily. 06/19/19   [provider]  promethazine (PHENERGAN) 25 MG tablet Take 1 tablet (25 mg total) by mouth at bedtime as needed for nausea. For nausea and sleep 07/14/20   Dwyane Dee, MD  rosuvastatin (CRESTOR) 20 MG tablet TAKE 1 TABLET BY MOUTH EVERY DAY 10/30/20   [name], MD  sertraline (ZOLOFT) 100 MG tablet Take 100 mg by mouth daily. 06/12/20   [provider]    Allergies    Tetracycline, Niacin, Niaspan [niacin er], Sulfa antibiotics, Sulfonamide derivatives, Codeine, Erythromycin, Hydromorphone hcl, Morphine and related, Nalbuphine, Sulfasalazine, and Tape  Review of Systems   Review of Systems   Constitutional: Negative for chills, fatigue and fever.  HENT: Negative for congestion.   Eyes: Negative for pain.  Respiratory: Negative for cough and shortness of breath.   Cardiovascular: Negative for chest pain and leg swelling.  Gastrointestinal: Negative for abdominal pain and vomiting.  Genitourinary: Negative for dysuria.  Musculoskeletal: Negative for myalgias.  Skin: Negative for rash.  Neurological: Negative for dizziness and headaches.       Lightheadedness/near syncope    Physical Exam Updated Vital Signs BP 138/71   Pulse 65   Temp 98.7 F (37.1 C) (Oral)   Resp 17   Ht _0  (1.549 m)   Wt 105.7 kg   SpO2 100%   BMI 44.03 kg/m   Physical Exam Vitals and nursing note reviewed.  Constitutional:      General: She is not in acute  distress.    Appearance: She is obese.     Comments: Patient is pleasant 65 year old female speaking in full sentences, is quite comfortable in bed, watching TV prior to my entry in room.  Joking with examiner.  Able answer questions appropriate follow commands.  HENT:     Head: Normocephalic and atraumatic.     Nose: Nose normal.  Eyes:     General: No scleral icterus. Cardiovascular:     Rate and Rhythm: Normal rate and regular rhythm.     Pulses: Normal pulses.     Heart sounds: Normal heart sounds.  Pulmonary:     Effort: Pulmonary effort is normal. No respiratory distress.     Breath sounds: No wheezing.     Comments: Lungs are clear to auscultation all fields.  No tachypnea or increased work of breathing. Abdominal:     Palpations: Abdomen is soft.     Tenderness: There is no abdominal tenderness. There is no guarding or rebound.  Musculoskeletal:     Cervical back: Normal range of motion.     Right lower leg: No edema.     Left lower leg: No edema.     Comments: No significant lower extremity edema.  No calf tenderness or leg swelling.  Skin:    General: Skin is warm and dry.     Capillary Refill: Capillary refill  takes less than 2 seconds.  Neurological:     Mental Status: She is alert. Mental status is at baseline.  Psychiatric:        Mood and Affect: Mood normal.        Behavior: Behavior normal.     ED Results / Procedures / Treatments   Labs (all labs ordered are listed, but only abnormal results are displayed) Labs Reviewed  BASIC METABOLIC PANEL - Abnormal; Notable for the following components:      Result Value   Sodium 133 (*)    Chloride 96 (*)    Glucose, Bld 323 (*)    Creatinine, Ser 1.17 (*)    GFR, Estimated 52 (*)    All other components within normal limits  CBC - Abnormal; Notable for the following components:   RBC 3.75 (*)    Hemoglobin 11.4 (*)    All other components within normal limits  URINALYSIS, ROUTINE W REFLEX MICROSCOPIC - Abnormal; Notable for the following components:   Glucose, UA 50 (*)    All other components within normal limits  BRAIN NATRIURETIC PEPTIDE - Abnormal; Notable for the following components:   B Natriuretic Peptide 300.3 (*)    All other components within normal limits  CBG MONITORING, ED - Abnormal; Notable for the following components:   Glucose-Capillary 211 (*)    All other components within normal limits  TROPONIN I (HIGH SENSITIVITY) - Abnormal; Notable for the following components:   Troponin I (High Sensitivity) 18 (*)    All other components within normal limits  CBG MONITORING, ED  TROPONIN I (HIGH SENSITIVITY)    EKG None  Radiology DG Chest Portable 1 View  Result Date: 10/30/2020 CLINICAL DATA:  Shortness of breath. EXAM: PORTABLE CHEST 1 VIEW COMPARISON:  07/11/2020 FINDINGS: Post median sternotomy and CABG. Chronic cardiomegaly. Interstitial thickening suspicious for pulmonary edema. No large pleural effusion. No pneumothorax. Subsegmental scarring in the periphery of the left mid lung. No acute osseous abnormalities are seen. IMPRESSION: Chronic cardiomegaly. Interstitial thickening suspicious for pulmonary edema.  Electronically Signed   By: Aurther Loft.D.  On: 10/30/2020 20:04    Procedures Procedures   Medications Ordered in ED Medications  oxyCODONE-acetaminophen (PERCOCET/ROXICET) 5-325 MG per tablet 1 tablet (1 tablet Oral Given 10/30/20 2008)    ED Course  I have reviewed the triage vital signs and the nursing notes.  Pertinent labs & imaging results that were available during my care of the patient were reviewed by me and considered in my medical decision making (see chart for details).  Patient is a 65 year old female with a myriad of medical issues presented today primarily because she is having issues with her blood pressure medication management.  She does have a follow-up appointment with her nephrologist tomorrow as well as is planning on scheduling cardiology and PCP appointments.  She has no symptoms presently at all.  BNP mildly elevated patient does not appear to be fluid overloaded. Urinalysis with small glucose.  CBC without leukocytosis or significant anemia.  Blood sugar initially elevated improved to 211 without intervention.  Clinical Course as of 11/01/20 0050  Wed Oct 30, 2020  2147 Chest x-ray reviewed no significant changes from prior.  Appears to have chronic pulmonary edema setting between 96 and 100% on room air. [WF]  2149 Reviewed patient's troponin it is at patient's baseline. [WF]    Clinical Course User Index [WF] Tedd Sias, PA   MDM Rules/Calculators/A&P                          Suspect that some of patient's lightheadedness is from her uncontrolled diabetes which is likely causing some dehydration.  She tolerated p.o. and was given p.o. fluids here in the ER.  With her somewhat elevated BNP I am hesitant to give her IV fluids.  Patient ambulate without difficulty at her baseline.  No episodes of hypotension or lightheadedness during her ER visit or during ambulation.  We will discharge with close follow-up with PCP.  Return precautions  given.  Final Clinical Impression(s) / ED Diagnoses Final diagnoses:  Vertigo  Shortness of breath   The medical records were personally reviewed by myself. I personally reviewed all lab results and interpreted all imaging studies and either concurred with their official read or contacted radiology for clarification. Additional history obtained from old records  This patient appears reasonably screened and I doubt any other medical condition requiring further workup, evaluation, or treatment in the ED at this time prior to discharge.   Patient's vitals are WNL apart from vital sign abnormalities discussed above, patient is in NAD, and able to ambulate in the ED at their baseline and able to tolerate PO.  Pain has been managed or a plan has been made for home management and has no complaints prior to discharge. Patient is comfortable with above plan and for discharge at this time. All questions were answered prior to disposition. Results from the ER workup discussed with the patient face to face and all questions answered to the best of my ability. The patient is safe for discharge with strict return precautions. Patient appears safe for discharge with appropriate follow-up. Conveyed my impression with the patient and they voiced understanding and are agreeable to plan.   An After Visit Summary was printed and given to the patient.  Portions of this note were generated with [employer]obbyist. Dictation errors may occur despite best attempts at proofreading.    Rx / DC Orders ED Discharge Orders    None       Pati Gallo  S, PA 11/01/20 0052    Drenda Freeze, MD 11/01/20 [phone]

## 2020-10-30 NOTE — ED Notes (Signed)
Patient reports she wears 3L [place] at baseline.

## 2020-10-30 NOTE — ED Notes (Signed)
Patient able to tolerate PO Diet Ginger ale and water.

## 2020-10-30 NOTE — Discharge Instructions (Addendum)
Your work-up today was reassuring.  Please follow-up closely with your primary care doctor, cardiologist and the rest of your care team.  Please take your medications as prescribed including your blood sugar medications and blood pressure medications.

## 2020-10-30 NOTE — ED Triage Notes (Signed)
[name] for complaint of shortness of breath, SpO2 96% on room air and speaking in complete sentences. Patient also reports intermittent dizziness. Patient alert, oriented, and in no apparent distress.

## 2020-10-31 DIAGNOSIS — H52203 Unspecified astigmatism, bilateral: Secondary | ICD-10-CM | POA: Diagnosis not present

## 2020-10-31 DIAGNOSIS — H34231 Retinal artery branch occlusion, right eye: Secondary | ICD-10-CM | POA: Diagnosis not present

## 2020-10-31 DIAGNOSIS — H524 Presbyopia: Secondary | ICD-10-CM | POA: Diagnosis not present

## 2020-10-31 DIAGNOSIS — C4490 Unspecified malignant neoplasm of skin, unspecified: Secondary | ICD-10-CM | POA: Diagnosis not present

## 2020-10-31 DIAGNOSIS — H2513 Age-related nuclear cataract, bilateral: Secondary | ICD-10-CM | POA: Diagnosis not present

## 2020-10-31 DIAGNOSIS — Z940 Kidney transplant status: Secondary | ICD-10-CM | POA: Diagnosis not present

## 2020-10-31 DIAGNOSIS — H5203 Hypermetropia, bilateral: Secondary | ICD-10-CM | POA: Diagnosis not present

## 2020-10-31 DIAGNOSIS — E118 Type 2 diabetes mellitus with unspecified complications: Secondary | ICD-10-CM | POA: Diagnosis not present

## 2020-10-31 DIAGNOSIS — D631 Anemia in chronic kidney disease: Secondary | ICD-10-CM | POA: Diagnosis not present

## 2020-10-31 DIAGNOSIS — R413 Other amnesia: Secondary | ICD-10-CM | POA: Diagnosis not present

## 2020-10-31 DIAGNOSIS — N189 Chronic kidney disease, unspecified: Secondary | ICD-10-CM | POA: Diagnosis not present

## 2020-10-31 DIAGNOSIS — I129 Hypertensive chronic kidney disease with stage 1 through stage 4 chronic kidney disease, or unspecified chronic kidney disease: Secondary | ICD-10-CM | POA: Diagnosis not present

## 2020-10-31 DIAGNOSIS — J449 Chronic obstructive pulmonary disease, unspecified: Secondary | ICD-10-CM | POA: Diagnosis not present

## 2020-10-31 DIAGNOSIS — E119 Type 2 diabetes mellitus without complications: Secondary | ICD-10-CM | POA: Diagnosis not present

## 2020-10-31 LAB — TOXASSURE SELECT,+ANTIDEPR,UR

## 2020-10-31 LAB — HM DIABETES EYE EXAM

## 2020-11-01 ENCOUNTER — Telehealth: Payer: Self-pay | Admitting: *Deleted

## 2020-11-01 NOTE — Telephone Encounter (Signed)
Urine drug screen for this encounter is consistent for prescribed medication 

## 2020-11-08 ENCOUNTER — Other Ambulatory Visit (HOSPITAL_COMMUNITY): Payer: Self-pay | Admitting: Nurse Practitioner

## 2020-11-11 ENCOUNTER — Telehealth: Payer: Self-pay | Admitting: Emergency Medicine

## 2020-11-11 MED ORDER — DOXYCYCLINE HYCLATE 100 MG PO TABS: 100.0000 mg | ORAL_TABLET | Freq: Two times a day (BID) | ORAL | 0 refills | Status: DC

## 2020-11-11 MED ORDER — PREDNISONE 10 MG PO TABS: ORAL_TABLET | ORAL | 0 refills | Status: DC

## 2020-11-11 NOTE — Telephone Encounter (Signed)
Primary Pulmonologist: Byrum Last office visit and with whom: 03/07/20 Rexene Edison NP What do we see them for (pulmonary problems):   Obstructive chronic Asthmatic bronchitis Last OV assessment/plan:  Assessment and Plan: Acute asthmatic bronchitic exacerbation +/-early sinusitis. Patient has multiple medication allergies and antibiotic intolerance.  Says that she can tolerate doxycycline well.  Plan  Patient Instructions  Doxycycline 100mg  Twice daily  For 1 week , take with food.  Continue on Budesonide and Brovana Twice daily   Albuterol As needed  .  Increase Prednisone 10mg  daily for 1 week then back 5mg  daily  Follow up with Dr. Lamonte Sakai in 6-8 weeks with PFT and As needed   Please contact office for sooner follow up if symptoms do not improve or worsen or seek emergency care       Follow Up Instructions: Follow-up in 6 weeks and as needed We will reschedule PFTs  I discussed the assessment and treatment plan with the patient. The patient was provided an opportunity to ask questions and all were answered. The patient agreed with the plan and demonstrated an understanding of the instructions.  The patient was advised to call back or seek an in-person evaluation if the symptoms worsen or if the condition fails to improve as anticipated.  I provided 22  minutes of non-face-to-face time during this encounter.   Rexene Edison, NP        Patient Instructions by Melvenia Needles, NP at 03/07/2020 11:30 AM  Author: Melvenia Needles, NP Author Type: Nurse Practitioner Filed: 03/07/2020 11:58 AM  Note Status: Addendum Cosign: Cosign Not Required Encounter Date: 03/07/2020  Editor: Melvenia Needles, NP (Nurse Practitioner)      Prior Versions: 1. Parrett, Fonnie Mu, NP (Nurse Practitioner) at 03/07/2020 11:58 AM - Addendum   2. Parrett, Fonnie Mu, NP (Nurse Practitioner) at 03/07/2020 11:55 AM - Signed    Doxycycline 100mg  Twice daily  For 1 week , take with food.  Continue  on Budesonide and Brovana Twice daily   Albuterol As needed  .  Increase Prednisone 10mg  daily for 1 week then back 5mg  daily  Follow up with Dr. Lamonte Sakai in 6-8 weeks with PFT and As needed   Please contact office for sooner follow up if symptoms do not improve or worsen or seek emergency care          Instructions     Return in about 6 weeks (around 04/18/2020).  Doxycycline 100mg  Twice daily  For 1 week , take with food.  Continue on Budesonide and Brovana Twice daily   Albuterol As needed  .  Increase Prednisone 10mg  daily for 1 week then back 5mg  daily  Follow up with Dr. Lamonte Sakai in 6-8 weeks with PFT and As needed   Please contact office for sooner follow up if symptoms do not improve or worsen or seek emergency care          Reason for call: Pt calling because she is not feeling very well. Pt states she "knows" she has an URI. Pt states this is the first year she's been on O2. No energy, low grade fever,just feeling bad.   Started feeing bad last week.  Sitting/writing/typing something and she drops off to sleep.  She knows she needs to have her iron checked.  She has no energy.  Oxygen on RA is 84%.  She puts oxygen on when it reaches 88%.  She usually just wears it most of the time.  Coughing a lot  for 2 weeks, it is yellow and sometimes green.  Taking breathing treatments twice daily.  Running a low grade fever of 99.1, having body aches and chills.  Had her first covid vaccine, she ended up in the hospital (had symptoms of covid).  Last tested for covid a month ago and it was negative.  Has head and chest congestion as well a post nasal drip.  States she does not have a good sense of taste.  Things taste like salt.  She has a limited sense of smell that is not new.  On 3L oxygen and prednisone 5 mg daily.  Dr. Lamonte Sakai, please advise.  Thank you.  (examples of things to ask: : When did symptoms start? Fever? Cough? Productive? Color to sputum? More sputum than usual? Wheezing?  Have you needed increased oxygen? Are you taking your respiratory medications? What over the counter measures have you tried?)  Allergies  Allergen Reactions  . Tetracycline Hives    Patient tolerated Doxycycline Dec 2020  . Niacin Other (See Comments)    Mouth blisters  . Niaspan [Niacin Er] Other (See Comments)    Mouth blisters  . Sulfa Antibiotics Nausea Only and Other (See Comments)    "Tears up stomach"  . Sulfonamide Derivatives Other (See Comments)    Reaction: per patient "tears her stomach up"  . Codeine Nausea And Vomiting  . Erythromycin Nausea And Vomiting  . Hydromorphone Hcl Nausea And Vomiting  . Morphine And Related Nausea And Vomiting  . Nalbuphine Nausea And Vomiting    Nubain  . Sulfasalazine Nausea Only and Other (See Comments)    per patient "tears her stomach up", "Tears up stomach"  . Tape Rash and Other (See Comments)    No "plastic" tape," please    Immunization History  Administered Date(s) Administered  . Influenza Split 05/11/2012, 05/17/2016, 05/17/2017  . Influenza Whole 06/17/2009, 06/17/2010, 04/18/2011  . Influenza,inj,Quad PF,6+ Mos 08/24/2013, 05/20/2015, 04/21/2018, 04/01/2019, 08/13/2020  . Pneumococcal Conjugate-13 08/13/2020  . Pneumococcal Polysaccharide-23 11/30/2017  . Tdap 11/30/2017  . Zoster Recombinat (Shingrix) 07/04/2018, 03/08/2019

## 2020-11-11 NOTE — Telephone Encounter (Signed)
Make sure she is on her flonase and singulair  Will treat her as a possible acute exacerbation. If she is not improving then she will need to be seen in office. Also, would strongly consider retesting for COVID, especially if she is going to be around people  Prednisone >> Take 40mg  daily for 3 days, then 30mg  daily for 3 days, then 20mg  daily for 3 days, then 10mg  daily for 3 days, then back to her usual 5mg   Doxycycline 100mg  bid x 7 days.

## 2020-11-11 NOTE — Telephone Encounter (Signed)
Called and spoke with patient, advised of recommendations per Dr. Lamonte Sakai, Advised to let us know if she gets a positive Covid test. She verbalized understanding.  She has a f/u with Dr. Lamonte Sakai on 11/20/20.  Nothing further needed.

## 2020-11-20 ENCOUNTER — Other Ambulatory Visit: Payer: Self-pay

## 2020-11-20 ENCOUNTER — Ambulatory Visit (INDEPENDENT_AMBULATORY_CARE_PROVIDER_SITE_OTHER): Payer: Medicare Other | Admitting: Emergency Medicine

## 2020-11-20 ENCOUNTER — Encounter: Payer: Self-pay | Admitting: Emergency Medicine

## 2020-11-20 DIAGNOSIS — J4530 Mild persistent asthma, uncomplicated: Secondary | ICD-10-CM

## 2020-11-20 NOTE — Progress Notes (Signed)
Virtual Visit via Video Note  I connected with Katherine [name] on 11/20/20 at  4:15 PM EDT by a video enabled telemedicine application and verified that I am speaking with the correct person using two identifiers.  Location: Patient: Home Provider: Office   I discussed the limitations of evaluation and management by telemedicine and the availability of in person appointments. The patient expressed understanding and agreed to proceed.  History of Present Illness: [name] is 37 with a history of obesity, OSA formally on CPAP (no longer), renal transplantation on low-dose prednisone and Imuran, hypertension with diastolic dysfunction and intermittent episodes of volume overload.  We follow her for obstructive lung disease with frequent asthmatic bronchitis as well as upper airway irritation syndrome and chronic cough, both impacted by rhinitis and GERD.  She has chronic hypoxemia documented on previous amatory oximetry requiring 2 L/min with exertion and with sleep.  Her last visit was 1 year ago.  She was treated for an acute sinusitis with associated vertigo in July 2021.   Observations/Objective She called with an upper respiratory infection, possible evolving sinusitis and vertigo 3/28, was treated for an exacerbation with prednisone taper and doxycycline. She started to experience significant indigestion, GERD since she started the meds. She has very low energy, dizziness that have been present since before these prescriptions. Her breathing has improved with the above. She had a negative COVID test 3 weeks ago.   Maintenance bronchodilator therapy includes Brovana and albuterol as needed.  Also using budesonide nebs She is on Singulair, prednisone 5 mg, Prilosec once daily  Assessment and Plan: AE-COPD/Asthma Worsening GERD, probably due to her increased prednisone.  Chronic hypoxemia Vertigo / dizziness  - start taking PPI bid x 1 week, then go back to QD - stop the pred taper  and go back to 5mg  daily.  - finish the doxycycline  - continue Brovana, pulmicort nebs, albuterol prn.  - O2 at 3L/min.  - push PO fluids.   Follow Up Instructions:    I discussed the assessment and treatment plan with the patient. The patient was provided an opportunity to ask questions and all were answered. The patient agreed with the plan and demonstrated an understanding of the instructions.   The patient was advised to call back or seek an in-person evaluation if the symptoms worsen or if the condition fails to improve as anticipated.  I provided 21 minutes of non-face-to-face time during this encounter.   Collene Gobble, MD

## 2020-11-27 ENCOUNTER — Ambulatory Visit: Payer: Medicare Other | Admitting: Physical Medicine & Rehabilitation

## 2020-12-04 DIAGNOSIS — Z940 Kidney transplant status: Secondary | ICD-10-CM | POA: Diagnosis not present

## 2020-12-10 ENCOUNTER — Other Ambulatory Visit: Payer: Self-pay | Admitting: Emergency Medicine

## 2020-12-16 ENCOUNTER — Telehealth: Payer: Self-pay | Admitting: Emergency Medicine

## 2020-12-16 NOTE — Telephone Encounter (Signed)
Called and spoke with patient who is requesting refill be sent in for Flonase nasal spray to [facility] on [address].   Dr Lamonte Sakai please advise.

## 2020-12-17 ENCOUNTER — Telehealth: Payer: Self-pay

## 2020-12-17 MED ORDER — FLUTICASONE PROPIONATE 50 MCG/ACT NA SUSP: NASAL | 1 refills | Status: DC

## 2020-12-17 NOTE — Telephone Encounter (Signed)
Called and spoke with Patient. Flonase refill sent to requested [facility]. Nothing further at this time.

## 2020-12-17 NOTE — Telephone Encounter (Signed)
Please refill.

## 2020-12-17 NOTE — Telephone Encounter (Signed)
Voicemail left for pt to call back to set up an AWV

## 2020-12-18 ENCOUNTER — Encounter: Payer: Medicare Other | Admitting: Physical Medicine & Rehabilitation

## 2020-12-19 ENCOUNTER — Telehealth: Payer: Self-pay

## 2020-12-19 NOTE — Telephone Encounter (Signed)
Returned Katherine [name] call, she staes she is having pain in her lower back radiating into her bilateral hips. Also reports she had a MRI on her Lower back and bilateral hips. She states someone told her " her back was messed up", she was very tearful. She was instructed to call Raliegh Ip to obtain clarification of her results. Emotional support was given, and she will call she was encouraged to call our office if she needs [place]. She verbalizes understanding.

## 2020-12-19 NOTE — Telephone Encounter (Signed)
Katherine [name] has requested a call back. Patient did not want to leave detail. She stated it was personal.   Call back phone [phone].

## 2020-12-24 NOTE — Progress Notes (Signed)
Subjective:    Patient ID: Katherine [name], female    DOB: [birth year], 65 y.o.   MRN: [id number]  HPI: Katherine [name] is a 65 y.o. female whose appointment was changed to a telephone visit, she called office reporting she wasn't feeling well today. Katherine [name] also stated she doesn't have Internet service. Katherine [name] agrees with telephone visit and verbalizes understanding. She states her pain is located in her lower back and bilateral hip pain. She rates her pain 8. Her current exercise regime is walking and performing stretching exercises.  Katherine [name] was scheduled for a Face to Face Wheelchair assessment, due to the above, a referral will be placed for PT evaluation for wheelchair, she verbalizes understanding.   Katherine [name] equivalent is 40.00 MME.  Last UDS was performed on 10/23/2020, it was consistent.    Pain Inventory Average Pain 8 Pain Right Now 8 My pain is sharp, burning, dull, stabbing, tingling and aching  In the last 24 hours, has pain interfered with the following? General activity 9 Relation with others 9 Enjoyment of life 9 What TIME of day is your pain at its worst? morning  and night Sleep (in general) Good  Pain is worse with: walking, bending, inactivity and some activites Pain improves with: rest, heat/ice and medication Relief from Meds: 9  Family History  Problem Relation Age of Onset  . Cancer Mother        liver  . Heart disease Father   . Cancer Father        colon  . Arrhythmia Brother        Atrial Fibrillation  . Arrhythmia Paternal Aunt        Atrial Fibrillation   Social History   Socioeconomic History  . Marital status: Widowed    Spouse name: Not on file  . Number of children: Not on file  . Years of education: Not on file  . Highest education level: Not on file  Occupational History  . Not on file  Tobacco Use  . Smoking status: Former Smoker    Packs/day: 1.00    Years: 30.00    Pack years: 30.00    Types:  Cigarettes    Quit date: 08/17/2002    Years since quitting: 18.3  . Smokeless tobacco: Never Used  Vaping Use  . Vaping Use: Never used  Substance and Sexual Activity  . Alcohol use: No  . Drug use: No  . Sexual activity: Not on file  Other Topics Concern  . Not on file  Social History Narrative   She is currently married, and the caregiver of her husband who is recovering from surgery for tongue cancer now diagnosed with lung cancer. Prior to his diagnosis of her husband, she actually had adopted a 81-year-old child who she knows caring for as well. With all the surgeries, they have been quite financially troubled. Thanks the help of her community and church, they have been able to stay "alfoat."     She is a former smoker who quit in 2004 after a 30-pack-year history.   She is active chasing a 101-year-old child, does not do routine exercise. She's been quite depressed with the condition of her husband, and admits to eating comfort herself.   She does not drink alcohol.   Social Determinants of Health   Financial Resource Strain: Not on file  Food Insecurity: No Food Insecurity  . Worried About Charity fundraiser in the Last Year: Never  true  . Ran Out of Food in the Last Year: Never true  Transportation Needs: No Transportation Needs  . Lack of Transportation (Medical): No  . Lack of Transportation (Non-Medical): No  Physical Activity: Not on file  Stress: Not on file  Social Connections: Not on file   Past Surgical History:  Procedure Laterality Date  . ABDOMINAL AORTOGRAM N/A 04/21/2018   Procedure: ABDOMINAL AORTOGRAM;  Surgeon: [name], MD;  Location: [place] [facility];  Service: Cardiovascular;  Laterality: N/A;  . CATHETER REMOVAL    . CHOLECYSTECTOMY N/A 10/29/2014   Procedure: LAPAROSCOPIC CHOLECYSTECTOMY WITH INTRAOPERATIVE CHOLANGIOGRAM;  Surgeon: Excell Seltzer, MD;  [facility]: [facility];  Service: General;  Laterality: N/A;  . CORONARY ANGIOPLASTY  1994   x5   . CORONARY ARTERY BYPASS GRAFT  1995   LIMA-LAD, SVG-RPDA, SVG-D1  . ESOPHAGOGASTRODUODENOSCOPY N/A 10/15/2016   Procedure: ESOPHAGOGASTRODUODENOSCOPY (EGD);  Surgeon: Wilford Corner, MD;  Location: [facility];  Service: Endoscopy;  Laterality: N/A;  . I & D EXTREMITY Right 01/29/2018   Procedure: IRRIGATION AND DEBRIDEMENT THUMB;  Surgeon: Dayna Barker, MD;  Location: [place];  Service: Plastics;  Laterality: Right;  . INCISE AND DRAIN ABCESS    . KIDNEY TRANSPLANT  1991  . KNEE ARTHROSCOPY WITH LATERAL MENISECTOMY Left 12/03/2017   Procedure: LEFT KNEE ARTHROSCOPY WITH LATERAL MENISECTOMY;  Surgeon: Earlie Server, MD;  Location: [place];  Service: Orthopedics;  Laterality: Left;  . LAPAROSCOPIC GASTRIC BANDING  04/2004; 10/'09, 2/'10   Port Replacement x 2  . LEFT HEART CATH AND CORS/GRAFTS ANGIOGRAPHY N/A 04/21/2018   Procedure: LEFT HEART CATH AND CORS/GRAFTS ANGIOGRAPHY;  Surgeon: [name], MD;  Location: [place] [facility];  Ost-Prox LAD 50% - proxLAD (pre & post D1) 100% CTO. Cx - patent, small OM1 (stable ~ ostial OM1 90%, too small for PCI) & 2 small LPL; Ost-distal RCA 100% CTO.  LIMA-LAD (not injected); SVG-dRCA patent, SVG-D1 - insertion stent ~20% ISR - Severe R CFA disease w/ focal Sub TO  . LEFT HEART CATH AND CORS/GRAFTS ANGIOGRAPHY  5/'01, 3/'02, 8/'03, 10/'04; 1/'15   08/22/2013: LAD & RCA 100%; LIMA-LAD & SVG-rPDA patent; Cx-- OM1 60%, OM2 ostial ~50%; SVG-D1 - 80% mid, 50% distal ISR --PCI  . LEFT HEART CATHETERIZATION WITH CORONARY/GRAFT ANGIOGRAM N/A 08/23/2013   Procedure: LEFT HEART CATHETERIZATION WITH Beatrix Fetters;  Surgeon: Wellington Hampshire, MD;  Location: [place] [facility];  Service: Cardiovascular;  Laterality: N/A;  . Lower Extremity Arterial Dopplers  08/2013   ABI: R 0.96, L 1.04  . MULTIPLE TOOTH EXTRACTIONS  age 35  . NM MYOVIEW LTD  03/2016   EF 62%. LOW RISK. C/W prior MI - no Ischemia. Apical hypokinesis.  [name] Kitchen PERCUTANEOUS CORONARY STENT INTERVENTION  (PCI-S)  5/'01, 3/'02, 8/'03, 10/'04;   '01 - S660 BMS 2.5 x 9 - dSVG-D1 into D1; '02- post-stent stenosis - 2.5 x 8 Pixel BMS; '8\03: ISR/Thrombosis into native D1 - AngioJet, 2.5 x 13 Pixel; '04 - ISR 95% - covered stented area with Taxus DES 2.5 mm x 20 (2.88)  . PERCUTANEOUS CORONARY STENT INTERVENTION (PCI-S)  08/23/2013   Procedure: PERCUTANEOUS CORONARY STENT INTERVENTION (PCI-S);  Surgeon: Wellington Hampshire, MD;  Location: [name] C[facility];  Service: Cardiovascular;;mid SVG-D1 80%; distal stent ~50% ISR; Promus Prermier DES 2.75 mm xc 20 mm (2.8 mm)  . PORT-A-CATH REMOVAL     kidney  . TRANSTHORACIC ECHOCARDIOGRAM  07/2019   EF 55 to 60%.  No LVH.  Paradoxical septal wall motion-post CABG.  GRII DD.  Normal RV size and function.  Mild bilateral atrial dilation.  Moderate MAC.  Trace MR.  Mild aortic stenosis (gradients: Mean 14.3 mmHg -peak 24.9 mmHg).  . TUBAL LIGATION    . wrist fistula repair Left    dialysis for one year   Past Surgical History:  Procedure Laterality Date  . ABDOMINAL AORTOGRAM N/A 04/21/2018   Procedure: ABDOMINAL AORTOGRAM;  Surgeon: [name], MD;  Location: [place] [facility];  Service: Cardiovascular;  Laterality: N/A;  . CATHETER REMOVAL    . CHOLECYSTECTOMY N/A 10/29/2014   Procedure: LAPAROSCOPIC CHOLECYSTECTOMY WITH INTRAOPERATIVE CHOLANGIOGRAM;  Surgeon: Excell Seltzer, MD;  [facility]: [facility];  Service: General;  Laterality: N/A;  . CORONARY ANGIOPLASTY  1994   x5  . CORONARY ARTERY BYPASS GRAFT  1995   LIMA-LAD, SVG-RPDA, SVG-D1  . ESOPHAGOGASTRODUODENOSCOPY N/A 10/15/2016   Procedure: ESOPHAGOGASTRODUODENOSCOPY (EGD);  Surgeon: Wilford Corner, MD;  Location: [facility] ENDOSCOPY;  Service: Endoscopy;  Laterality: N/A;  . I & D EXTREMITY Right 01/29/2018   Procedure: IRRIGATION AND DEBRIDEMENT THUMB;  Surgeon: Dayna Barker, MD;  Location: [place];  Service: Plastics;  Laterality: Right;  . INCISE AND DRAIN ABCESS    . KIDNEY TRANSPLANT  1991  . KNEE  ARTHROSCOPY WITH LATERAL MENISECTOMY Left 12/03/2017   Procedure: LEFT KNEE ARTHROSCOPY WITH LATERAL MENISECTOMY;  Surgeon: Earlie Server, MD;  Location: [place];  Service: Orthopedics;  Laterality: Left;  . LAPAROSCOPIC GASTRIC BANDING  04/2004; 10/'09, 2/'10   Port Replacement x 2  . LEFT HEART CATH AND CORS/GRAFTS ANGIOGRAPHY N/A 04/21/2018   Procedure: LEFT HEART CATH AND CORS/GRAFTS ANGIOGRAPHY;  Surgeon: [name], MD;  Location: [place] [facility];  Ost-Prox LAD 50% - proxLAD (pre & post D1) 100% CTO. Cx - patent, small OM1 (stable ~ ostial OM1 90%, too small for PCI) & 2 small LPL; Ost-distal RCA 100% CTO.  LIMA-LAD (not injected); SVG-dRCA patent, SVG-D1 - insertion stent ~20% ISR - Severe R CFA disease w/ focal Sub TO  . LEFT HEART CATH AND CORS/GRAFTS ANGIOGRAPHY  5/'01, 3/'02, 8/'03, 10/'04; 1/'15   08/22/2013: LAD & RCA 100%; LIMA-LAD & SVG-rPDA patent; Cx-- OM1 60%, OM2 ostial ~50%; SVG-D1 - 80% mid, 50% distal ISR --PCI  . LEFT HEART CATHETERIZATION WITH CORONARY/GRAFT ANGIOGRAM N/A 08/23/2013   Procedure: LEFT HEART CATHETERIZATION WITH Beatrix Fetters;  Surgeon: Wellington Hampshire, MD;  Location: [place] [facility];  Service: Cardiovascular;  Laterality: N/A;  . Lower Extremity Arterial Dopplers  08/2013   ABI: R 0.96, L 1.04  . MULTIPLE TOOTH EXTRACTIONS  age 65  . NM MYOVIEW LTD  03/2016   EF 62%. LOW RISK. C/W prior MI - no Ischemia. Apical hypokinesis.  [name] Kitchen PERCUTANEOUS CORONARY STENT INTERVENTION (PCI-S)  5/'01, 3/'02, 8/'03, 10/'04;   '01 - S660 BMS 2.5 x 9 - dSVG-D1 into D1; '02- post-stent stenosis - 2.5 x 8 Pixel BMS; '8\03: ISR/Thrombosis into native D1 - AngioJet, 2.5 x 13 Pixel; '04 - ISR 95% - covered stented area with Taxus DES 2.5 mm x 20 (2.88)  . PERCUTANEOUS CORONARY STENT INTERVENTION (PCI-S)  08/23/2013   Procedure: PERCUTANEOUS CORONARY STENT INTERVENTION (PCI-S);  Surgeon: Wellington Hampshire, MD;  Location: [facility] CATH LAB;  Service: Cardiovascular;;mid SVG-D1 80%; distal  stent ~50% ISR; Promus Prermier DES 2.75 mm xc 20 mm (2.8 mm)  . PORT-A-CATH REMOVAL     kidney  . TRANSTHORACIC ECHOCARDIOGRAM  07/2019   EF 55  to 60%.  No LVH.  Paradoxical septal wall motion-post CABG.  GRII DD.  Normal RV size and function.  Mild bilateral atrial dilation.  Moderate MAC.  Trace MR.  Mild aortic stenosis (gradients: Mean 14.3 mmHg -peak 24.9 mmHg).  . TUBAL LIGATION    . wrist fistula repair Left    dialysis for one year   Past Medical History:  Diagnosis Date  . Anemia   . Anxiety   . Bilateral carotid artery stenosis    Carotid duplex 0/5697: 9-48% LICA, 01-65% RICA, >53% RECA, f/u 1 yr suggested  . CAD (coronary artery disease) of bypass graft 5/01; 3/'02, 8/'03, 10/'04; 1/15   PCI x 5 to SVG-D1   . CAD in native artery 07/1993   3 Vessel Disease (LAD-D1 & RCA) -- CABG (Dx in setting of inferior STEMI-PTCA of RCA)  . CAD S/P percutaneous coronary angioplasty    PCI to SVG-D1 insertion/native D1 x 4 = '01 -(S660 BMS 2.5 x 9 anastomosis- D1); '02 - distal overlap ACS Pixel 2.5 x 8  BMS; '03 distal/native ISR/Thrombosis - Pixel 2.5 x 13; '04 - ISR-  Taxus 2.5 x 20 (covered all);; 1/15 - mid SVG-D1 (50% distal ISR) - Promus P 2.75 x 20 -- 2.8 mm  . COPD mixed type ([place])    Followed by Dr. Lamonte Sakai "pulmonologist said no COPD"  . Depression   . Depression with anxiety   . Diabetes mellitus type 2 in obese ([place])   . Diarrhea    started after cholecystectomy and mass removed from intestine  . Dyslipidemia, goal LDL below 70    08/2012: TC 137, TG 200, HDL 32!, LDL 45; on statin (followed by Dr.Deterding)  . ESRD (end stage renal disease) ([place]) 1991   s/p Cadaveric Renal Transplant [facility] - Dr. Jimmy Footman)   . Family history of adverse reaction to anesthesia    mom's bp dropped during/after anesthesia  . Fibromyalgia   . GERD (gastroesophageal reflux disease)   . Glomerulonephritis, chronic, rapidly progressive 88  . H/O ST elevation myocardial infarction (STEMI) of  inferoposterior wall 07/1993   Rescue PTCA of RCA -- referred for CABG.  . H/O: GI bleed   . Headache    migraines in the past  . History of CABG x 3 08/1993   Dr. Servando Snare: LIMA-LAD, SVG-bifurcatingD1, SVG-rPDA  . History of kidney stones   . History of stroke 2012   "right eye stroke- half blind now"  . Hypertension associated with diabetes ([place])   . Mild aortic stenosis by prior echocardiogram 07/2019   Echo:  Mild aortic stenosis (gradients: Mean 14.3 mmHg -peak 24.9 mmHg).  . Morbid obesity ([place])   . MRSA (methicillin resistant staph aureus) culture positive   . OSA (obstructive sleep apnea)    no longer on CPAP or home O2, states she doesn't need now after lap band  . PAD (peripheral artery disease) ([place]) 08/2013   LEA Dopplers to be read by Dr. Fletcher Anon  . PAF (paroxysmal atrial fibrillation) ([place]) 06/2014   Noted on CardioNet Monitor  - --> rhythm control with Tikosyn (Dr. Rayann Heman); converted from warfarin to apixaban for anticoagulation.  . Pneumonia   . Recurrent boils    Bilateral Groin  . Rheumatoid arthritis ([place])    Per Patient Report; associated with OA  . S/p cadaver renal transplant [place]  . Unstable angina ([place]) 5/01; 3/'02, 8/'03, 10/'04; 1/15   x 5 occurences since Inf-Post STEMI in 1994  Ht _0  (1.549 m)   Wt 210 lb (95.3 kg)   BMI 39.68 kg/m   Opioid Risk Score:   Fall Risk Score:  `1  Depression screen PHQ 2/9  Depression screen [facility] 2/9 01/04/2020 11/02/2018  Decreased Interest 0 3  Down, Depressed, Hopeless 1 3  PHQ - 2 Score 1 6  Altered sleeping - 1  Tired, decreased energy - 1  Change in appetite - 1  Feeling bad or failure about yourself  - 0  Trouble concentrating - 0  Moving slowly or fidgety/restless - 0  Suicidal thoughts - 0  PHQ-9 Score - 9  Some recent data might be hidden   Review of Systems  Constitutional: Positive for fatigue.  Respiratory: Positive for shortness of breath.   Musculoskeletal: Positive for arthralgias,  back pain, gait problem, myalgias, neck pain and neck stiffness.       Bilateral hip pain  Neurological: Positive for dizziness.  Psychiatric/Behavioral: Positive for dysphoric mood.  All other systems reviewed and are negative.      Objective:   Physical Exam Vitals and nursing note reviewed.  Musculoskeletal:     Comments: No Physical Exam Performed. Telephone Visit           Assessment & Plan:  1. Fibromyalgia: Continue with HEP as tolerated. Continue Tegretol and Cymbalta. 12/25/2020 2. Rheumatoid Arthritis: Refilled: Oxycodone18m one tablet every 8 hours as needed for severe pain #80. Continueto Monitor. 12/25/2020 3. Osteoarthritis--polyarticular: Continue with current medication regime and continue to monitor.12/25/2020 4. ESRD with hx of renal transplant on chronic steroids: Nephrology Following.12/25/2020. 5. Bilateral Shoulder pain/Right rotator cuff tendonitis/like degenerative/inflammatory changes in the right shoulder also. Continue HEP and Continue to Monitor.12/25/2020. 6. Muscle Spasm: Continue Flexeril. 12/25/2020 7. Depression:Continue current medication regimen. Continue to monitor.12/25/2020. 8.Lumbar Radiculitis: PreviousLeft 405 Transformanial ESI with relief noted. No complaints today. Continue HEP as Tolerated.Continue to Monitor.12/25/2020. 9.BilateralGreater Trochanteric Bursitis: Continue HEP alternate Ice and Heat Therapy. 12/25/2020. 10. Type 2 DM with Peripheral Neuropathy: Continue Tegretol. 12/25/2020  F/U in 1 Month. Ms. SVickreywould like to see Dr SNaaman Plummer Telephone Call Established Patient Location of Patient in her Home Location o Provider in the Office Total Time Spent 20 Minutes

## 2020-12-25 ENCOUNTER — Encounter: Payer: Medicare Other | Attending: Physical Medicine & Rehabilitation | Admitting: Registered Nurse

## 2020-12-25 ENCOUNTER — Other Ambulatory Visit: Payer: Self-pay

## 2020-12-25 VITALS — Ht 61.0 in | Wt 210.0 lb

## 2020-12-25 DIAGNOSIS — M1711 Unilateral primary osteoarthritis, right knee: Secondary | ICD-10-CM | POA: Insufficient documentation

## 2020-12-25 DIAGNOSIS — M7061 Trochanteric bursitis, right hip: Secondary | ICD-10-CM | POA: Insufficient documentation

## 2020-12-25 DIAGNOSIS — G894 Chronic pain syndrome: Secondary | ICD-10-CM | POA: Diagnosis not present

## 2020-12-25 DIAGNOSIS — M25511 Pain in right shoulder: Secondary | ICD-10-CM | POA: Diagnosis not present

## 2020-12-25 DIAGNOSIS — G8929 Other chronic pain: Secondary | ICD-10-CM | POA: Insufficient documentation

## 2020-12-25 DIAGNOSIS — M5416 Radiculopathy, lumbar region: Secondary | ICD-10-CM | POA: Diagnosis not present

## 2020-12-25 DIAGNOSIS — M47816 Spondylosis without myelopathy or radiculopathy, lumbar region: Secondary | ICD-10-CM | POA: Insufficient documentation

## 2020-12-25 DIAGNOSIS — M797 Fibromyalgia: Secondary | ICD-10-CM | POA: Diagnosis not present

## 2020-12-25 DIAGNOSIS — M25512 Pain in left shoulder: Secondary | ICD-10-CM | POA: Insufficient documentation

## 2020-12-25 DIAGNOSIS — Z79891 Long term (current) use of opiate analgesic: Secondary | ICD-10-CM | POA: Insufficient documentation

## 2020-12-25 DIAGNOSIS — Z5181 Encounter for therapeutic drug level monitoring: Secondary | ICD-10-CM | POA: Diagnosis not present

## 2020-12-25 DIAGNOSIS — M7062 Trochanteric bursitis, left hip: Secondary | ICD-10-CM | POA: Insufficient documentation

## 2020-12-25 MED ORDER — OXYCODONE HCL 10 MG PO TABS: 10.0000 mg | ORAL_TABLET | Freq: Three times a day (TID) | ORAL | 0 refills | Status: DC | PRN

## 2020-12-26 ENCOUNTER — Ambulatory Visit: Payer: Medicare Other | Admitting: Cardiology

## 2020-12-27 ENCOUNTER — Other Ambulatory Visit (HOSPITAL_COMMUNITY): Payer: Self-pay | Admitting: Nurse Practitioner

## 2021-01-01 ENCOUNTER — Telehealth: Payer: Self-pay | Admitting: Emergency Medicine

## 2021-01-01 ENCOUNTER — Encounter: Payer: Self-pay | Admitting: Emergency Medicine

## 2021-01-01 ENCOUNTER — Ambulatory Visit (INDEPENDENT_AMBULATORY_CARE_PROVIDER_SITE_OTHER): Payer: Medicare Other | Admitting: Emergency Medicine

## 2021-01-01 DIAGNOSIS — K219 Gastro-esophageal reflux disease without esophagitis: Secondary | ICD-10-CM

## 2021-01-01 DIAGNOSIS — J309 Allergic rhinitis, unspecified: Secondary | ICD-10-CM | POA: Diagnosis not present

## 2021-01-01 DIAGNOSIS — J449 Chronic obstructive pulmonary disease, unspecified: Secondary | ICD-10-CM

## 2021-01-01 DIAGNOSIS — J9611 Chronic respiratory failure with hypoxia: Secondary | ICD-10-CM | POA: Insufficient documentation

## 2021-01-01 NOTE — Progress Notes (Signed)
Virtual Visit via Telephone Note  I connected with Katherine [name] on 01/01/21 at  1:45 PM EDT by telephone and verified that I am speaking with the correct person using two identifiers.  Location: Patient: Home Provider: Office   I discussed the limitations, risks, security and privacy concerns of performing an evaluation and management service by telephone and the availability of in person appointments. I also discussed with the patient that there may be a patient responsible charge related to this service. The patient expressed understanding and agreed to proceed.   History of Present Illness: 66 year old woman with history of OSA (off CPAP since weight loss), renal transplant on Imuran, low-dose prednisone, hypertension with diastolic dysfunction and history of diastolic CHF.  She is followed here for COPD and frequent episodes of asthmatic bronchitis.  She also has chronic cough due to upper airway irritation syndrome, rhinitis, GERD.  She is on 3 L/min chronically with exertion and while sleeping.  Last treated for an acute exacerbation in March 2022   Observations/Objective: Remains on brovana, budesonide.  Using albuterol approximately 2x a day, helps her breathing.  Remains on Singulair, prednisone 5 mg, flonase, omeprazole  Using her O2 at 3L/min at night, occasionally during the day if heavier exertion.   She is feeling fairly well. She will have cough, sneezing, some wheeze when she is out in the pollen.  No flares since last time. She is on abx currently for a UTI.   Assessment and Plan: Chronic allergic rhinitis More active right now.  She will work on avoiding pollen if possible.  Continue her Flonase, Singulair at current dosing  Obstructive chronic bronchitis without exacerbation (HCC) Overall stable since last visit on Brovana, budesonide.  Using albuterol twice a day which is her usual routine.  No flares since March.  Continue the above, continue prednisone.  Control  her rhinitis and GERD  Chronic respiratory failure with hypoxia (HCC) Continue 3 L/min while sleeping.  She is probably under using her oxygen with exertion.  Advised her today to wear her 3 L/min with most exertion.  GERD Continue current dose omeprazole    Follow Up Instructions: 6 months or prn   I discussed the assessment and treatment plan with the patient. The patient was provided an opportunity to ask questions and all were answered. The patient agreed with the plan and demonstrated an understanding of the instructions.   The patient was advised to call back or seek an in-person evaluation if the symptoms worsen or if the condition fails to improve as anticipated.  I provided 16 minutes of non-face-to-face time during this encounter.   Collene Gobble, MD

## 2021-01-01 NOTE — Telephone Encounter (Signed)
Per RB- ok this time but next must be in person  I called and explained this to the pt Nothing further needed

## 2021-01-01 NOTE — Telephone Encounter (Signed)
Called and spoke to pt. Pt states she has a UTI and would like to make today's office visit into a telephone visit. Pt states she is uncomfortable and doesn't want to come into the office if she doesn't have to. Pt is c/o increase in prod cough with light yellow mucus and wheezing. Pt states these symptoms have been occurring for several weeks since the pollen has become worse. Pt's appt is for 01/01/21 at 1:45pm.   Dr. Lamonte Sakai please advise. Thanks.

## 2021-01-01 NOTE — Assessment & Plan Note (Signed)
Continue 3 L/min while sleeping.  She is probably under using her oxygen with exertion.  Advised her today to wear her 3 L/min with most exertion.

## 2021-01-01 NOTE — Assessment & Plan Note (Signed)
Continue current dose omeprazole

## 2021-01-01 NOTE — Assessment & Plan Note (Signed)
Overall stable since last visit on Brovana, budesonide.  Using albuterol twice a day which is her usual routine.  No flares since March.  Continue the above, continue prednisone.  Control her rhinitis and GERD

## 2021-01-01 NOTE — Assessment & Plan Note (Signed)
More active right now.  She will work on avoiding pollen if possible.  Continue her Flonase, Singulair at current dosing

## 2021-01-06 ENCOUNTER — Encounter: Payer: Self-pay | Admitting: Registered Nurse

## 2021-01-07 IMAGING — DX DG CHEST 1V PORT
1 series · 1 of 1 positions shown · non-contrast
Comparison: 08/08/2019

CLINICAL DATA: Shortness of breath, chest pain

EXAM:
PORTABLE CHEST 1 VIEW

[chest]
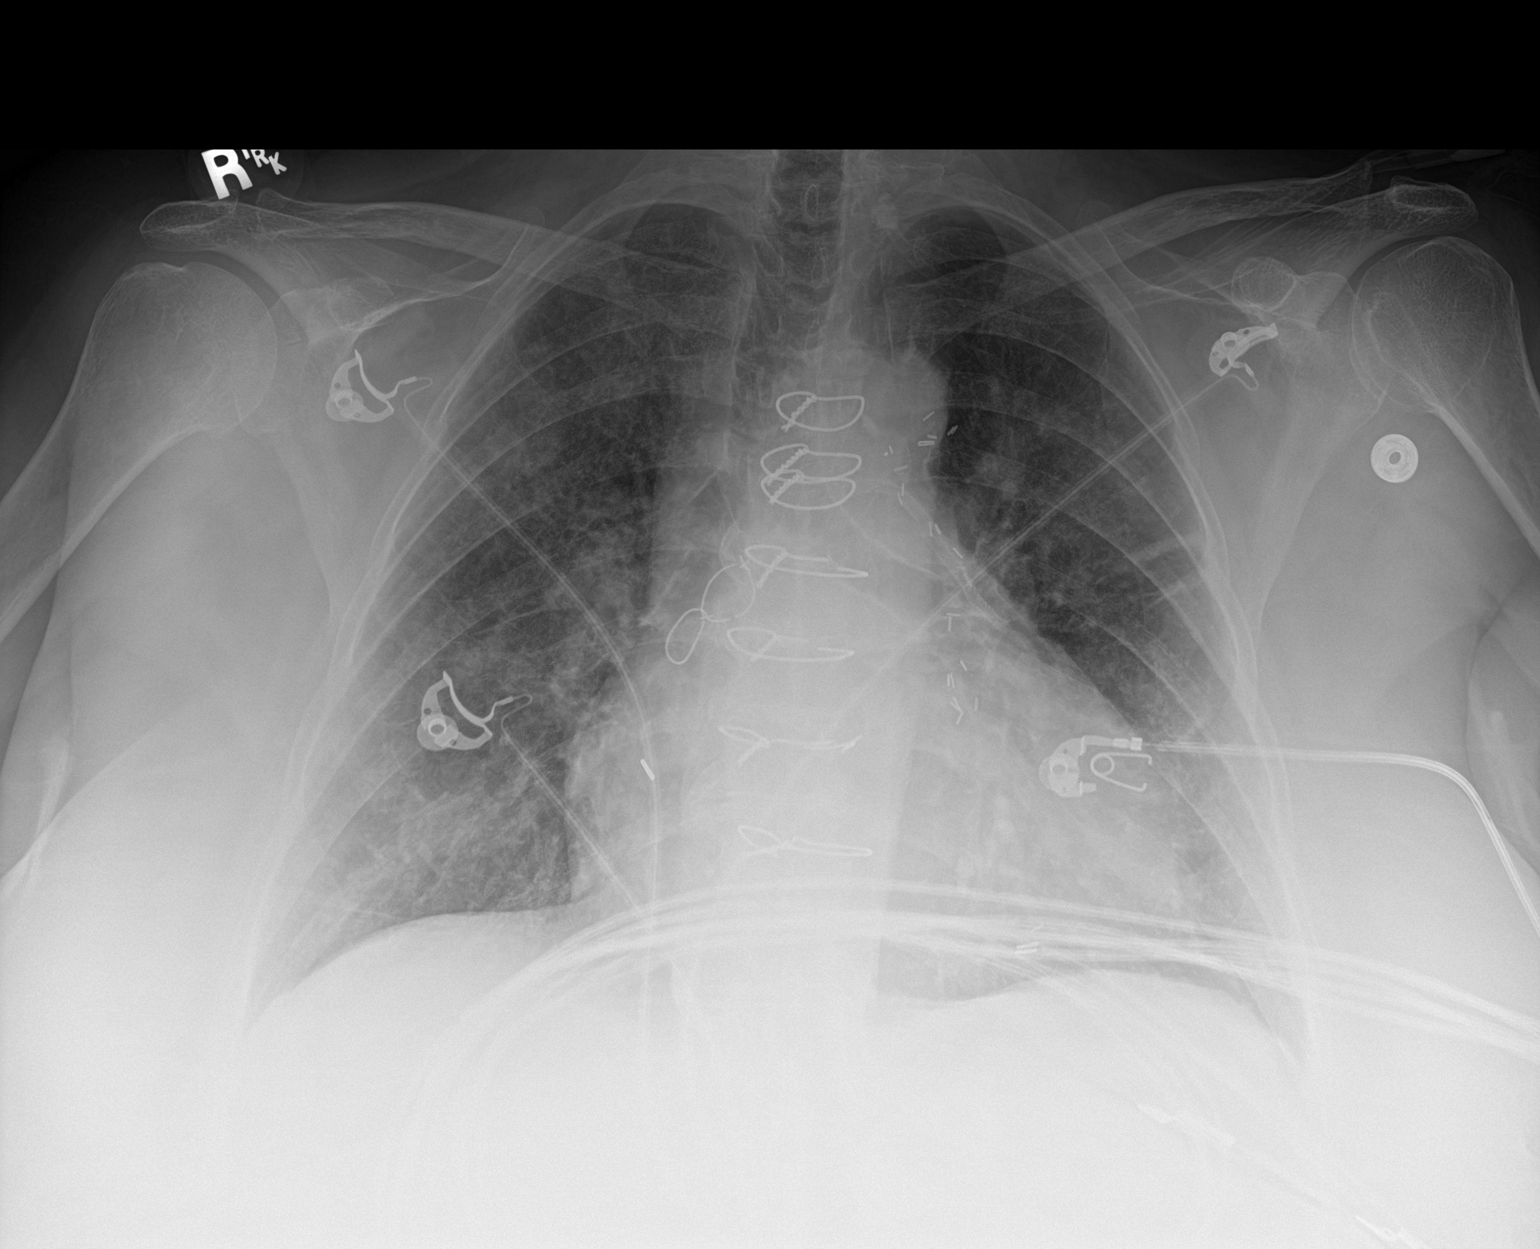

[1 of 1 positions shown; findings below may reference images not displayed]

FINDINGS: Prior CABG. Cardiomegaly. Vascular congestion and interstitial
prominence may reflect early interstitial edema. No effusions or
acute bony abnormality.
IMPRESSION: Cardiomegaly with vascular congestion and possible early
interstitial edema.

## 2021-01-15 ENCOUNTER — Encounter: Payer: Medicare Other | Admitting: Physical Medicine & Rehabilitation

## 2021-01-16 ENCOUNTER — Telehealth: Payer: Self-pay

## 2021-01-16 ENCOUNTER — Telehealth: Payer: Self-pay | Admitting: Cardiology

## 2021-01-16 NOTE — Telephone Encounter (Signed)
Patient next appointment is on 03/12/2021. She as called twice today with concerns about running out of her medication before the appoitment.   She has requested to please speak to [facility] NP. Please call patient back on before [phone].

## 2021-01-16 NOTE — Telephone Encounter (Signed)
Return Ms. Supinski call, no answer. Left message for Ms. Cahoon to call to schedule a office appointment. She had an appointment with Dr Naaman Plummer on 01/15/2021, she no showed for her appointment.

## 2021-01-16 NOTE — Telephone Encounter (Signed)
Spoke with pt, for about 1 month now she has been more tired than usual. When she is working in the house, washing dishes or cleaning up her blood pressure will elevate, her heart rate will elevate and she will get chest pain and jaw pain. She will take metoprolol 50 mg and diltiazem 30 mg and after 5-10 min it will go away. She also reports left arm pain at rest. She reports this will happen at least once daily. She has not had any today because she has a virus and a low grade fever and has been in the bed all day. She has not had atrial fib. She is under a lot of stress, 2 people that were close to her have died recently. She has a virtual appointment with dr harding on 02/11/21 and does not want to see the APP. She feels this is the same way she felt prior to her MI and has not gone to the ER because the pain will go away. Explained to the patient there are no openings with dr harding prior to the 6/28 appt she has but will forward this information to dr harding for his review. Patient voiced understanding that if she gets the discomfort again she needs to go to the ER because she is concerned her stents are blocked again.

## 2021-01-16 NOTE — Telephone Encounter (Signed)
Pt c/o of Chest Pain: STAT if CP now or developed within 24 hours  1. Are you having CP right now? no  2. Are you experiencing any other symptoms (ex. SOB, nausea, vomiting, sweating)? Nausea and vomiting, but does not think related to heart  3. How long have you been experiencing CP? Couple months  4. Is your CP continuous or coming and going? Comes and goes  5. Have you taken Nitroglycerin? No, prescription is expired ?  Patient states she has been having chest pain for the last couple of months, but it has been getting more frequent. She states it comes and goes, but has not had any chest pain today. She states it starts when she gets up and tries to work around the house without taking her medications. She states she takes her metoprolol and rests. She states the pain radiates to her left arm and up her jaw. She states she has had nausea and vomiting, but does not think it is related. She states she is not sure if it is related to her oxygen.

## 2021-01-17 ENCOUNTER — Telehealth: Payer: Self-pay | Admitting: Registered Nurse

## 2021-01-17 NOTE — Telephone Encounter (Signed)
RN discussed with [name] .  RN Spoke to patient  An offered Jun 13 appointment. Patient decline unable come that day . Offered June 20 , 2022 at 2 pm. Patient accepted appointment time aware to be at office no later than 1:45 pm

## 2021-01-17 NOTE — Telephone Encounter (Signed)
Return Katherine [name] call, she reports she cancel her appointment with Dr Naaman Plummer on June 1,2022, due to a fever. Her last offive visit was in March, she calls office frequently asking for telephone visit. Reviewed the office policy with her about being seen due to prescribing narcotic. She became terful and hollering how her other doctors let her performed phone calls and why this provider and Dr Naaman Plummer won't allow her phone visit. Reiterating again the office policy, she would like for Dr Naaman Plummer to call her. This message will be routed to Dr Naaman Plummer , she verbalizes understanding. I will also ask Godfrey Pick to call Katherine [name] regarding the office policy, she wouldn't listen to this provider.

## 2021-01-22 ENCOUNTER — Telehealth: Payer: Self-pay | Admitting: Cardiology

## 2021-01-22 MED ORDER — METOPROLOL TARTRATE 50 MG PO TABS: 50.0000 mg | ORAL_TABLET | Freq: Two times a day (BID) | ORAL | 0 refills | Status: DC

## 2021-01-22 NOTE — Telephone Encounter (Signed)
Patient notified that the refill has been sent but to keep her appointment on 02/03/21. One month refill was sent.

## 2021-01-22 NOTE — Telephone Encounter (Signed)
 *  STAT* If patient is at the pharmacy, call can be transferred to refill team.   1. Which medications need to be refilled? (please list name of each medication and dose if known)   metoprolol tartrate (LOPRESSOR) 50 MG tablet  2. Which pharmacy/location (including street and city if local pharmacy) is medication to be sent to?  [id number] - [place], [place] - [address].  3. Do they need a 30 day or 90 day supply? 43  URGENT- patient is completely out, has appt on 02/03/21

## 2021-01-24 ENCOUNTER — Telehealth: Payer: Self-pay

## 2021-01-24 DIAGNOSIS — M797 Fibromyalgia: Secondary | ICD-10-CM

## 2021-01-24 MED ORDER — OXYCODONE HCL 10 MG PO TABS: 10.0000 mg | ORAL_TABLET | Freq: Three times a day (TID) | ORAL | 0 refills | Status: DC | PRN

## 2021-01-24 NOTE — Telephone Encounter (Signed)
I refilled her oxycodone today. Please let her know that her next RF will require an in person visit. I think that we've been more than lenient with her. Her last in person visit was March.

## 2021-01-24 NOTE — Telephone Encounter (Signed)
Patient notified

## 2021-01-24 NOTE — Telephone Encounter (Signed)
[name] NP is not in the office today. Kasy L. Earp has requested an Oxycodone 10 MG refill.  PMP reported last filled date & written date was on 12/25/2020.  Please advise or prescribe.  Call back ph [phone].

## 2021-01-24 NOTE — Telephone Encounter (Signed)
Katherine [name] called again about her pain medication.

## 2021-01-24 NOTE — Addendum Note (Signed)
Addended by: Alger Simons T on: 01/24/2021 03:03 PM   Modules accepted: Orders

## 2021-01-26 ENCOUNTER — Other Ambulatory Visit (HOSPITAL_COMMUNITY): Payer: Self-pay | Admitting: Nurse Practitioner

## 2021-01-27 ENCOUNTER — Telehealth (HOSPITAL_COMMUNITY): Payer: Self-pay

## 2021-01-27 NOTE — Telephone Encounter (Signed)
Contacted patient to schedule her 6 month follow up visit. She is scheduled on 6/29 @ 3:00pm. She was given the parking code for June. Patient verbalized understanding.

## 2021-01-28 ENCOUNTER — Inpatient Hospital Stay (HOSPITAL_COMMUNITY)
Admission: EM | Admit: 2021-01-28 | Discharge: 2021-02-03 | DRG: 286 | Disposition: A | Discharge status: Home / Self Care | Payer: Medicare Other | Attending: Family Medicine | Admitting: Family Medicine

## 2021-01-28 DIAGNOSIS — I5032 Chronic diastolic (congestive) heart failure: Secondary | ICD-10-CM | POA: Diagnosis present

## 2021-01-28 DIAGNOSIS — N186 End stage renal disease: Secondary | ICD-10-CM | POA: Diagnosis present

## 2021-01-28 DIAGNOSIS — E1122 Type 2 diabetes mellitus with diabetic chronic kidney disease: Secondary | ICD-10-CM | POA: Diagnosis present

## 2021-01-28 DIAGNOSIS — I132 Hypertensive heart and chronic kidney disease with heart failure and with stage 5 chronic kidney disease, or end stage renal disease: Principal | ICD-10-CM | POA: Diagnosis present

## 2021-01-28 DIAGNOSIS — D638 Anemia in other chronic diseases classified elsewhere: Secondary | ICD-10-CM | POA: Diagnosis not present

## 2021-01-28 DIAGNOSIS — I509 Heart failure, unspecified: Secondary | ICD-10-CM | POA: Diagnosis not present

## 2021-01-28 DIAGNOSIS — E1165 Type 2 diabetes mellitus with hyperglycemia: Secondary | ICD-10-CM | POA: Diagnosis not present

## 2021-01-28 DIAGNOSIS — I5043 Acute on chronic combined systolic (congestive) and diastolic (congestive) heart failure: Secondary | ICD-10-CM | POA: Diagnosis not present

## 2021-01-28 DIAGNOSIS — S0990XA Unspecified injury of head, initial encounter: Secondary | ICD-10-CM | POA: Diagnosis not present

## 2021-01-28 DIAGNOSIS — J441 Chronic obstructive pulmonary disease with (acute) exacerbation: Secondary | ICD-10-CM | POA: Diagnosis present

## 2021-01-28 DIAGNOSIS — Z794 Long term (current) use of insulin: Secondary | ICD-10-CM

## 2021-01-28 DIAGNOSIS — I447 Left bundle-branch block, unspecified: Secondary | ICD-10-CM | POA: Diagnosis not present

## 2021-01-28 DIAGNOSIS — J9690 Respiratory failure, unspecified, unspecified whether with hypoxia or hypercapnia: Secondary | ICD-10-CM

## 2021-01-28 DIAGNOSIS — J811 Chronic pulmonary edema: Secondary | ICD-10-CM | POA: Diagnosis not present

## 2021-01-28 DIAGNOSIS — J449 Chronic obstructive pulmonary disease, unspecified: Secondary | ICD-10-CM | POA: Diagnosis present

## 2021-01-28 DIAGNOSIS — Z940 Kidney transplant status: Secondary | ICD-10-CM

## 2021-01-28 DIAGNOSIS — Z9981 Dependence on supplemental oxygen: Secondary | ICD-10-CM

## 2021-01-28 DIAGNOSIS — I739 Peripheral vascular disease, unspecified: Secondary | ICD-10-CM | POA: Diagnosis not present

## 2021-01-28 DIAGNOSIS — Z8249 Family history of ischemic heart disease and other diseases of the circulatory system: Secondary | ICD-10-CM

## 2021-01-28 DIAGNOSIS — M069 Rheumatoid arthritis, unspecified: Secondary | ICD-10-CM | POA: Diagnosis present

## 2021-01-28 DIAGNOSIS — E1169 Type 2 diabetes mellitus with other specified complication: Secondary | ICD-10-CM | POA: Diagnosis present

## 2021-01-28 DIAGNOSIS — R0789 Other chest pain: Secondary | ICD-10-CM | POA: Diagnosis not present

## 2021-01-28 DIAGNOSIS — J4489 Other specified chronic obstructive pulmonary disease: Secondary | ICD-10-CM | POA: Diagnosis present

## 2021-01-28 DIAGNOSIS — G8929 Other chronic pain: Secondary | ICD-10-CM | POA: Diagnosis present

## 2021-01-28 DIAGNOSIS — K219 Gastro-esophageal reflux disease without esophagitis: Secondary | ICD-10-CM | POA: Diagnosis present

## 2021-01-28 DIAGNOSIS — Z7901 Long term (current) use of anticoagulants: Secondary | ICD-10-CM

## 2021-01-28 DIAGNOSIS — Z7952 Long term (current) use of systemic steroids: Secondary | ICD-10-CM

## 2021-01-28 DIAGNOSIS — J411 Mucopurulent chronic bronchitis: Secondary | ICD-10-CM

## 2021-01-28 DIAGNOSIS — Z6841 Body Mass Index (BMI) 40.0 and over, adult: Secondary | ICD-10-CM

## 2021-01-28 DIAGNOSIS — I517 Cardiomegaly: Secondary | ICD-10-CM | POA: Diagnosis not present

## 2021-01-28 DIAGNOSIS — Z20822 Contact with and (suspected) exposure to covid-19: Secondary | ICD-10-CM | POA: Diagnosis present

## 2021-01-28 DIAGNOSIS — I252 Old myocardial infarction: Secondary | ICD-10-CM

## 2021-01-28 DIAGNOSIS — G4733 Obstructive sleep apnea (adult) (pediatric): Secondary | ICD-10-CM | POA: Diagnosis present

## 2021-01-28 DIAGNOSIS — Z882 Allergy status to sulfonamides status: Secondary | ICD-10-CM

## 2021-01-28 DIAGNOSIS — F419 Anxiety disorder, unspecified: Secondary | ICD-10-CM | POA: Diagnosis present

## 2021-01-28 DIAGNOSIS — Z888 Allergy status to other drugs, medicaments and biological substances status: Secondary | ICD-10-CM

## 2021-01-28 DIAGNOSIS — I2582 Chronic total occlusion of coronary artery: Secondary | ICD-10-CM | POA: Diagnosis present

## 2021-01-28 DIAGNOSIS — Z8673 Personal history of transient ischemic attack (TIA), and cerebral infarction without residual deficits: Secondary | ICD-10-CM

## 2021-01-28 DIAGNOSIS — F32A Depression, unspecified: Secondary | ICD-10-CM | POA: Diagnosis present

## 2021-01-28 DIAGNOSIS — Z79891 Long term (current) use of opiate analgesic: Secondary | ICD-10-CM

## 2021-01-28 DIAGNOSIS — I70201 Unspecified atherosclerosis of native arteries of extremities, right leg: Secondary | ICD-10-CM | POA: Diagnosis present

## 2021-01-28 DIAGNOSIS — Z91048 Other nonmedicinal substance allergy status: Secondary | ICD-10-CM

## 2021-01-28 DIAGNOSIS — I5033 Acute on chronic diastolic (congestive) heart failure: Secondary | ICD-10-CM | POA: Diagnosis not present

## 2021-01-28 DIAGNOSIS — I480 Paroxysmal atrial fibrillation: Secondary | ICD-10-CM | POA: Diagnosis present

## 2021-01-28 DIAGNOSIS — R079 Chest pain, unspecified: Secondary | ICD-10-CM | POA: Diagnosis not present

## 2021-01-28 DIAGNOSIS — Z79899 Other long term (current) drug therapy: Secondary | ICD-10-CM

## 2021-01-28 DIAGNOSIS — Z885 Allergy status to narcotic agent status: Secondary | ICD-10-CM

## 2021-01-28 DIAGNOSIS — I214 Non-ST elevation (NSTEMI) myocardial infarction: Secondary | ICD-10-CM

## 2021-01-28 DIAGNOSIS — E1142 Type 2 diabetes mellitus with diabetic polyneuropathy: Secondary | ICD-10-CM | POA: Diagnosis present

## 2021-01-28 DIAGNOSIS — I25110 Atherosclerotic heart disease of native coronary artery with unstable angina pectoris: Secondary | ICD-10-CM | POA: Diagnosis present

## 2021-01-28 DIAGNOSIS — E876 Hypokalemia: Secondary | ICD-10-CM | POA: Diagnosis present

## 2021-01-28 DIAGNOSIS — J4530 Mild persistent asthma, uncomplicated: Secondary | ICD-10-CM

## 2021-01-28 DIAGNOSIS — M797 Fibromyalgia: Secondary | ICD-10-CM | POA: Diagnosis present

## 2021-01-28 DIAGNOSIS — Z951 Presence of aortocoronary bypass graft: Secondary | ICD-10-CM

## 2021-01-28 DIAGNOSIS — R0602 Shortness of breath: Secondary | ICD-10-CM | POA: Diagnosis not present

## 2021-01-28 DIAGNOSIS — R4781 Slurred speech: Secondary | ICD-10-CM | POA: Diagnosis not present

## 2021-01-28 DIAGNOSIS — Z7984 Long term (current) use of oral hypoglycemic drugs: Secondary | ICD-10-CM

## 2021-01-28 DIAGNOSIS — R410 Disorientation, unspecified: Secondary | ICD-10-CM | POA: Diagnosis not present

## 2021-01-28 DIAGNOSIS — Z955 Presence of coronary angioplasty implant and graft: Secondary | ICD-10-CM

## 2021-01-28 DIAGNOSIS — I5023 Acute on chronic systolic (congestive) heart failure: Secondary | ICD-10-CM | POA: Diagnosis present

## 2021-01-28 DIAGNOSIS — E785 Hyperlipidemia, unspecified: Secondary | ICD-10-CM | POA: Diagnosis present

## 2021-01-28 DIAGNOSIS — Z87891 Personal history of nicotine dependence: Secondary | ICD-10-CM

## 2021-01-28 DIAGNOSIS — I110 Hypertensive heart disease with heart failure: Secondary | ICD-10-CM | POA: Diagnosis not present

## 2021-01-29 ENCOUNTER — Other Ambulatory Visit (HOSPITAL_COMMUNITY): Payer: Self-pay

## 2021-01-29 ENCOUNTER — Emergency Department (HOSPITAL_COMMUNITY): Payer: Medicare Other

## 2021-01-29 ENCOUNTER — Encounter (HOSPITAL_COMMUNITY): Payer: Self-pay | Admitting: Internal Medicine

## 2021-01-29 DIAGNOSIS — I509 Heart failure, unspecified: Secondary | ICD-10-CM

## 2021-01-29 DIAGNOSIS — I214 Non-ST elevation (NSTEMI) myocardial infarction: Secondary | ICD-10-CM | POA: Diagnosis not present

## 2021-01-29 DIAGNOSIS — I70201 Unspecified atherosclerosis of native arteries of extremities, right leg: Secondary | ICD-10-CM | POA: Diagnosis present

## 2021-01-29 DIAGNOSIS — F419 Anxiety disorder, unspecified: Secondary | ICD-10-CM | POA: Diagnosis present

## 2021-01-29 DIAGNOSIS — I480 Paroxysmal atrial fibrillation: Secondary | ICD-10-CM | POA: Diagnosis present

## 2021-01-29 DIAGNOSIS — J449 Chronic obstructive pulmonary disease, unspecified: Secondary | ICD-10-CM

## 2021-01-29 DIAGNOSIS — I739 Peripheral vascular disease, unspecified: Secondary | ICD-10-CM | POA: Diagnosis not present

## 2021-01-29 DIAGNOSIS — I517 Cardiomegaly: Secondary | ICD-10-CM | POA: Diagnosis not present

## 2021-01-29 DIAGNOSIS — Z940 Kidney transplant status: Secondary | ICD-10-CM

## 2021-01-29 DIAGNOSIS — R410 Disorientation, unspecified: Secondary | ICD-10-CM | POA: Diagnosis not present

## 2021-01-29 DIAGNOSIS — E1122 Type 2 diabetes mellitus with diabetic chronic kidney disease: Secondary | ICD-10-CM | POA: Diagnosis not present

## 2021-01-29 DIAGNOSIS — E1142 Type 2 diabetes mellitus with diabetic polyneuropathy: Secondary | ICD-10-CM | POA: Diagnosis not present

## 2021-01-29 DIAGNOSIS — E876 Hypokalemia: Secondary | ICD-10-CM | POA: Diagnosis present

## 2021-01-29 DIAGNOSIS — N186 End stage renal disease: Secondary | ICD-10-CM | POA: Diagnosis not present

## 2021-01-29 DIAGNOSIS — I110 Hypertensive heart disease with heart failure: Secondary | ICD-10-CM | POA: Diagnosis not present

## 2021-01-29 DIAGNOSIS — E1169 Type 2 diabetes mellitus with other specified complication: Secondary | ICD-10-CM | POA: Diagnosis not present

## 2021-01-29 DIAGNOSIS — Z951 Presence of aortocoronary bypass graft: Secondary | ICD-10-CM | POA: Diagnosis not present

## 2021-01-29 DIAGNOSIS — I132 Hypertensive heart and chronic kidney disease with heart failure and with stage 5 chronic kidney disease, or end stage renal disease: Secondary | ICD-10-CM | POA: Diagnosis not present

## 2021-01-29 DIAGNOSIS — J441 Chronic obstructive pulmonary disease with (acute) exacerbation: Secondary | ICD-10-CM | POA: Diagnosis not present

## 2021-01-29 DIAGNOSIS — I5032 Chronic diastolic (congestive) heart failure: Secondary | ICD-10-CM | POA: Diagnosis present

## 2021-01-29 DIAGNOSIS — R0602 Shortness of breath: Secondary | ICD-10-CM | POA: Diagnosis not present

## 2021-01-29 DIAGNOSIS — I5023 Acute on chronic systolic (congestive) heart failure: Secondary | ICD-10-CM | POA: Diagnosis present

## 2021-01-29 DIAGNOSIS — E785 Hyperlipidemia, unspecified: Secondary | ICD-10-CM | POA: Diagnosis present

## 2021-01-29 DIAGNOSIS — I25110 Atherosclerotic heart disease of native coronary artery with unstable angina pectoris: Secondary | ICD-10-CM | POA: Diagnosis present

## 2021-01-29 DIAGNOSIS — Z7901 Long term (current) use of anticoagulants: Secondary | ICD-10-CM | POA: Diagnosis not present

## 2021-01-29 DIAGNOSIS — Z9981 Dependence on supplemental oxygen: Secondary | ICD-10-CM | POA: Diagnosis not present

## 2021-01-29 DIAGNOSIS — I5033 Acute on chronic diastolic (congestive) heart failure: Secondary | ICD-10-CM | POA: Diagnosis not present

## 2021-01-29 DIAGNOSIS — S0990XA Unspecified injury of head, initial encounter: Secondary | ICD-10-CM | POA: Diagnosis not present

## 2021-01-29 DIAGNOSIS — Z955 Presence of coronary angioplasty implant and graft: Secondary | ICD-10-CM | POA: Diagnosis not present

## 2021-01-29 DIAGNOSIS — M069 Rheumatoid arthritis, unspecified: Secondary | ICD-10-CM | POA: Diagnosis present

## 2021-01-29 DIAGNOSIS — D638 Anemia in other chronic diseases classified elsewhere: Secondary | ICD-10-CM | POA: Diagnosis not present

## 2021-01-29 DIAGNOSIS — G8929 Other chronic pain: Secondary | ICD-10-CM | POA: Diagnosis present

## 2021-01-29 DIAGNOSIS — J811 Chronic pulmonary edema: Secondary | ICD-10-CM | POA: Diagnosis not present

## 2021-01-29 DIAGNOSIS — E1165 Type 2 diabetes mellitus with hyperglycemia: Secondary | ICD-10-CM | POA: Diagnosis not present

## 2021-01-29 DIAGNOSIS — Z20822 Contact with and (suspected) exposure to covid-19: Secondary | ICD-10-CM | POA: Diagnosis not present

## 2021-01-29 DIAGNOSIS — F32A Depression, unspecified: Secondary | ICD-10-CM | POA: Diagnosis not present

## 2021-01-29 DIAGNOSIS — I5043 Acute on chronic combined systolic (congestive) and diastolic (congestive) heart failure: Secondary | ICD-10-CM | POA: Diagnosis not present

## 2021-01-29 DIAGNOSIS — I5021 Acute systolic (congestive) heart failure: Secondary | ICD-10-CM | POA: Diagnosis not present

## 2021-01-29 DIAGNOSIS — Z6841 Body Mass Index (BMI) 40.0 and over, adult: Secondary | ICD-10-CM | POA: Diagnosis not present

## 2021-01-29 LAB — CBC WITH DIFFERENTIAL/PLATELET
Abs Immature Granulocytes: 0.03 10*3/uL (ref 0.00–0.07)
Abs Immature Granulocytes: 0.03 10*3/uL (ref 0.00–0.07)
Basophils Absolute: 0 10*3/uL (ref 0.0–0.1)
Basophils Absolute: 0 10*3/uL (ref 0.0–0.1)
Basophils Relative: 1 %
Basophils Relative: 0 %
Eosinophils Absolute: 0.2 10*3/uL (ref 0.0–0.5)
Eosinophils Absolute: 0 10*3/uL (ref 0.0–0.5)
Eosinophils Relative: 2 %
Eosinophils Relative: 0 %
HCT: 37.5 % (ref 36.0–46.0)
HCT: 34.9 % — ABNORMAL LOW (ref 36.0–46.0)
Hemoglobin: 11.8 g/dL — ABNORMAL LOW (ref 12.0–15.0)
Hemoglobin: 11.3 g/dL — ABNORMAL LOW (ref 12.0–15.0)
Immature Granulocytes: 0 %
Immature Granulocytes: 0 %
Lymphocytes Relative: 22 %
Lymphocytes Relative: 4 %
Lymphs Abs: 1.8 10*3/uL (ref 0.7–4.0)
Lymphs Abs: 0.3 10*3/uL — ABNORMAL LOW (ref 0.7–4.0)
MCH: 30.4 pg (ref 26.0–34.0)
MCH: 30.5 pg (ref 26.0–34.0)
MCHC: 31.5 g/dL (ref 30.0–36.0)
MCHC: 32.4 g/dL (ref 30.0–36.0)
MCV: 96.6 fL (ref 80.0–100.0)
MCV: 94.1 fL (ref 80.0–100.0)
Monocytes Absolute: 0.8 10*3/uL (ref 0.1–1.0)
Monocytes Absolute: 0.2 10*3/uL (ref 0.1–1.0)
Monocytes Relative: 10 %
Monocytes Relative: 2 %
Neutro Abs: 5.2 10*3/uL (ref 1.7–7.7)
Neutro Abs: 6.9 10*3/uL (ref 1.7–7.7)
Neutrophils Relative %: 65 %
Neutrophils Relative %: 94 %
Platelets: 222 10*3/uL (ref 150–400)
Platelets: 221 10*3/uL (ref 150–400)
RBC: 3.88 MIL/uL (ref 3.87–5.11)
RBC: 3.71 MIL/uL — ABNORMAL LOW (ref 3.87–5.11)
RDW: 15.3 % (ref 11.5–15.5)
RDW: 15.3 % (ref 11.5–15.5)
WBC: 8.1 10*3/uL (ref 4.0–10.5)
WBC: 7.4 10*3/uL (ref 4.0–10.5)
nRBC: 0 % (ref 0.0–0.2)
nRBC: 0 % (ref 0.0–0.2)

## 2021-01-29 LAB — TROPONIN I (HIGH SENSITIVITY)
Troponin I (High Sensitivity): 91 ng/L — ABNORMAL HIGH (ref <18)
Troponin I (High Sensitivity): 241 ng/L (ref <18)
Troponin I (High Sensitivity): 326 ng/L (ref <18)
Troponin I (High Sensitivity): 305 ng/L (ref <18)

## 2021-01-29 LAB — GLUCOSE, CAPILLARY
Glucose-Capillary: 433 mg/dL — ABNORMAL HIGH (ref 70–99)
Glucose-Capillary: 362 mg/dL — ABNORMAL HIGH (ref 70–99)
Glucose-Capillary: 328 mg/dL — ABNORMAL HIGH (ref 70–99)
Glucose-Capillary: 381 mg/dL — ABNORMAL HIGH (ref 70–99)

## 2021-01-29 LAB — I-STAT VENOUS BLOOD GAS, ED
Acid-Base Excess: 4 mmol/L — ABNORMAL HIGH (ref 0.0–2.0)
Bicarbonate: 28.1 mmol/L — ABNORMAL HIGH (ref 20.0–28.0)
Calcium, Ion: 1.11 mmol/L — ABNORMAL LOW (ref 1.15–1.40)
HCT: 35 % — ABNORMAL LOW (ref 36.0–46.0)
Hemoglobin: 11.9 g/dL — ABNORMAL LOW (ref 12.0–15.0)
O2 Saturation: 98 %
Potassium: 4 mmol/L (ref 3.5–5.1)
Sodium: 135 mmol/L (ref 135–145)
TCO2: 29 mmol/L (ref 22–32)
pCO2, Ven: 41.7 mmHg — ABNORMAL LOW (ref 44.0–60.0)
pH, Ven: 7.437 — ABNORMAL HIGH (ref 7.250–7.430)
pO2, Ven: 96 mmHg — ABNORMAL HIGH (ref 32.0–45.0)

## 2021-01-29 LAB — COMPREHENSIVE METABOLIC PANEL
ALT: 14 U/L (ref 0–44)
ALT: 12 U/L (ref 0–44)
AST: 18 U/L (ref 15–41)
AST: 17 U/L (ref 15–41)
Albumin: 3.4 g/dL — ABNORMAL LOW (ref 3.5–5.0)
Albumin: 3.3 g/dL — ABNORMAL LOW (ref 3.5–5.0)
Alkaline Phosphatase: 99 U/L (ref 38–126)
Alkaline Phosphatase: 96 U/L (ref 38–126)
Anion gap: 12 (ref 5–15)
Anion gap: 12 (ref 5–15)
BUN: 14 mg/dL (ref 8–23)
BUN: 14 mg/dL (ref 8–23)
CO2: 25 mmol/L (ref 22–32)
CO2: 26 mmol/L (ref 22–32)
Calcium: 9.6 mg/dL (ref 8.9–10.3)
Calcium: 9.2 mg/dL (ref 8.9–10.3)
Chloride: 98 mmol/L (ref 98–111)
Chloride: 96 mmol/L — ABNORMAL LOW (ref 98–111)
Creatinine, Ser: 0.9 mg/dL (ref 0.44–1.00)
Creatinine, Ser: 0.97 mg/dL (ref 0.44–1.00)
GFR, Estimated: >60 mL/min (ref >60)
GFR, Estimated: >60 mL/min (ref >60)
Glucose, Bld: 268 mg/dL — ABNORMAL HIGH (ref 70–99)
Glucose, Bld: 419 mg/dL — ABNORMAL HIGH (ref 70–99)
Potassium: 4.2 mmol/L (ref 3.5–5.1)
Potassium: 3.6 mmol/L (ref 3.5–5.1)
Sodium: 135 mmol/L (ref 135–145)
Sodium: 134 mmol/L — ABNORMAL LOW (ref 135–145)
Total Bilirubin: 0.5 mg/dL (ref 0.3–1.2)
Total Bilirubin: 0.9 mg/dL (ref 0.3–1.2)
Total Protein: 7.2 g/dL (ref 6.5–8.1)
Total Protein: 6.9 g/dL (ref 6.5–8.1)

## 2021-01-29 LAB — BRAIN NATRIURETIC PEPTIDE: B Natriuretic Peptide: 352.2 pg/mL — ABNORMAL HIGH (ref 0.0–100.0)

## 2021-01-29 LAB — HIV ANTIBODY (ROUTINE TESTING W REFLEX): HIV Screen 4th Generation wRfx: NONREACTIVE

## 2021-01-29 LAB — RESP PANEL BY RT-PCR (FLU A&B, COVID) ARPGX2
Influenza A by PCR: NEGATIVE
Influenza B by PCR: NEGATIVE
SARS Coronavirus 2 by RT PCR: NEGATIVE

## 2021-01-29 LAB — LACTIC ACID, PLASMA: Lactic Acid, Venous: 2 mmol/L (ref 0.5–1.9)

## 2021-01-29 LAB — TSH: TSH: 1.176 u[IU]/mL (ref 0.350–4.500)

## 2021-01-29 LAB — MAGNESIUM: Magnesium: 1.8 mg/dL (ref 1.7–2.4)

## 2021-01-29 MED ORDER — APIXABAN 5 MG PO TABS
5.0000 mg | ORAL_TABLET | Freq: Two times a day (BID) | ORAL | Status: DC
  Administered 2021-01-29: 5 mg via ORAL
  Filled 2021-01-29: qty 1

## 2021-01-29 MED ORDER — PROMETHAZINE HCL 25 MG PO TABS
25.0000 mg | ORAL_TABLET | Freq: Every evening | ORAL | Status: DC | PRN
  Filled 2021-01-29: qty 1

## 2021-01-29 MED ORDER — FUROSEMIDE 10 MG/ML IJ SOLN
40.0000 mg | Freq: Two times a day (BID) | INTRAMUSCULAR | Status: DC
  Administered 2021-01-29 – 2021-01-31 (×4): 40 mg via INTRAVENOUS
  Filled 2021-01-29 (×4): qty 4

## 2021-01-29 MED ORDER — IPRATROPIUM-ALBUTEROL 0.5-2.5 (3) MG/3ML IN SOLN
3.0000 mL | Freq: Once | RESPIRATORY_TRACT | Status: AC
  Administered 2021-01-29: 3 mL via RESPIRATORY_TRACT
  Filled 2021-01-29: qty 3

## 2021-01-29 MED ORDER — INSULIN ASPART 100 UNIT/ML IJ SOLN
0.0000 [IU] | Freq: Three times a day (TID) | INTRAMUSCULAR | Status: DC
  Administered 2021-01-29 (×2): 9 [IU] via SUBCUTANEOUS
  Administered 2021-01-29: 7 [IU] via SUBCUTANEOUS

## 2021-01-29 MED ORDER — ROSUVASTATIN CALCIUM 20 MG PO TABS
20.0000 mg | ORAL_TABLET | Freq: Every day | ORAL | Status: DC
  Administered 2021-01-29 – 2021-01-31 (×3): 20 mg via ORAL
  Filled 2021-01-29 (×3): qty 1

## 2021-01-29 MED ORDER — AZATHIOPRINE 50 MG PO TABS
125.0000 mg | ORAL_TABLET | Freq: Every day | ORAL | Status: DC
  Administered 2021-01-29 – 2021-02-03 (×6): 125 mg via ORAL
  Filled 2021-01-29 (×6): qty 3

## 2021-01-29 MED ORDER — OXYCODONE HCL 5 MG PO TABS
10.0000 mg | ORAL_TABLET | Freq: Three times a day (TID) | ORAL | Status: DC | PRN
  Administered 2021-01-29 – 2021-02-03 (×11): 10 mg via ORAL
  Filled 2021-01-29 (×12): qty 2

## 2021-01-29 MED ORDER — CYCLOBENZAPRINE HCL 10 MG PO TABS
20.0000 mg | ORAL_TABLET | Freq: Every day | ORAL | Status: DC
  Administered 2021-01-29 – 2021-02-02 (×5): 20 mg via ORAL
  Filled 2021-01-29 (×5): qty 2

## 2021-01-29 MED ORDER — MONTELUKAST SODIUM 10 MG PO TABS
10.0000 mg | ORAL_TABLET | Freq: Every day | ORAL | Status: DC
  Administered 2021-01-29 – 2021-02-02 (×5): 10 mg via ORAL
  Filled 2021-01-29 (×5): qty 1

## 2021-01-29 MED ORDER — METOPROLOL TARTRATE 50 MG PO TABS
50.0000 mg | ORAL_TABLET | Freq: Two times a day (BID) | ORAL | Status: DC
  Administered 2021-01-29 – 2021-01-30 (×3): 50 mg via ORAL
  Filled 2021-01-29 (×3): qty 1

## 2021-01-29 MED ORDER — METHYLPREDNISOLONE SODIUM SUCC 125 MG IJ SOLR
125.0000 mg | Freq: Once | INTRAMUSCULAR | Status: AC
  Administered 2021-01-29: 125 mg via INTRAVENOUS
  Filled 2021-01-29: qty 2

## 2021-01-29 MED ORDER — POTASSIUM CHLORIDE CRYS ER 20 MEQ PO TBCR
40.0000 meq | EXTENDED_RELEASE_TABLET | ORAL | Status: AC
  Administered 2021-01-29 (×2): 40 meq via ORAL
  Filled 2021-01-29 (×2): qty 2

## 2021-01-29 MED ORDER — PROMETHAZINE HCL 25 MG PO TABS
25.0000 mg | ORAL_TABLET | Freq: Four times a day (QID) | ORAL | Status: DC | PRN
  Administered 2021-01-29 – 2021-02-03 (×12): 25 mg via ORAL
  Filled 2021-01-29 (×11): qty 1

## 2021-01-29 MED ORDER — DULOXETINE HCL 60 MG PO CPEP
60.0000 mg | ORAL_CAPSULE | Freq: Every day | ORAL | Status: DC
  Administered 2021-01-29 – 2021-02-03 (×6): 60 mg via ORAL
  Filled 2021-01-29 (×6): qty 1

## 2021-01-29 MED ORDER — SERTRALINE HCL 100 MG PO TABS
100.0000 mg | ORAL_TABLET | Freq: Every day | ORAL | Status: DC
  Administered 2021-01-29 – 2021-02-03 (×6): 100 mg via ORAL
  Filled 2021-01-29 (×6): qty 1

## 2021-01-29 MED ORDER — HEPARIN (PORCINE) 25000 UT/250ML-% IV SOLN
1250.0000 [IU]/h | INTRAVENOUS | Status: DC
  Administered 2021-01-29: 850 [IU]/h via INTRAVENOUS
  Administered 2021-01-31: 1250 [IU]/h via INTRAVENOUS
  Filled 2021-01-29: qty 250

## 2021-01-29 MED ORDER — LORAZEPAM 1 MG PO TABS
1.0000 mg | ORAL_TABLET | Freq: Two times a day (BID) | ORAL | Status: DC
  Administered 2021-01-29 – 2021-02-03 (×11): 1 mg via ORAL
  Filled 2021-01-29 (×12): qty 1

## 2021-01-29 MED ORDER — PREDNISONE 5 MG PO TABS
5.0000 mg | ORAL_TABLET | Freq: Every day | ORAL | Status: DC
  Administered 2021-01-29 – 2021-02-03 (×6): 5 mg via ORAL
  Filled 2021-01-29 (×6): qty 1

## 2021-01-29 MED ORDER — BUDESONIDE 0.5 MG/2ML IN SUSP
2.0000 mL | Freq: Two times a day (BID) | RESPIRATORY_TRACT | Status: DC
  Administered 2021-01-29 – 2021-02-03 (×11): 0.5 mg via RESPIRATORY_TRACT
  Filled 2021-01-29 (×11): qty 2

## 2021-01-29 MED ORDER — ACETAMINOPHEN 650 MG RE SUPP: 650.0000 mg | Freq: Four times a day (QID) | RECTAL | Status: DC | PRN

## 2021-01-29 MED ORDER — ALBUTEROL SULFATE (2.5 MG/3ML) 0.083% IN NEBU: 2.5000 mg | INHALATION_SOLUTION | Freq: Four times a day (QID) | RESPIRATORY_TRACT | Status: DC | PRN

## 2021-01-29 MED ORDER — ACETAMINOPHEN 325 MG PO TABS
650.0000 mg | ORAL_TABLET | Freq: Four times a day (QID) | ORAL | Status: DC | PRN
  Administered 2021-01-30: 650 mg via ORAL
  Filled 2021-01-29 (×2): qty 2

## 2021-01-29 MED ORDER — LAMOTRIGINE 25 MG PO TABS
25.0000 mg | ORAL_TABLET | Freq: Every day | ORAL | Status: DC
  Administered 2021-01-29 – 2021-02-02 (×5): 25 mg via ORAL
  Filled 2021-01-29 (×6): qty 1

## 2021-01-29 MED ORDER — CALCITRIOL 0.25 MCG PO CAPS
0.2500 ug | ORAL_CAPSULE | ORAL | Status: DC
  Administered 2021-01-31 – 2021-02-03 (×2): 0.25 ug via ORAL
  Filled 2021-01-29 (×2): qty 1

## 2021-01-29 MED ORDER — ASPIRIN EC 81 MG PO TBEC
81.0000 mg | DELAYED_RELEASE_TABLET | Freq: Every day | ORAL | Status: DC
  Administered 2021-01-29 – 2021-02-03 (×6): 81 mg via ORAL
  Filled 2021-01-29 (×6): qty 1

## 2021-01-29 MED ORDER — ARFORMOTEROL TARTRATE 15 MCG/2ML IN NEBU
15.0000 ug | INHALATION_SOLUTION | Freq: Two times a day (BID) | RESPIRATORY_TRACT | Status: DC
  Administered 2021-01-29 – 2021-02-03 (×11): 15 ug via RESPIRATORY_TRACT
  Filled 2021-01-29 (×11): qty 2

## 2021-01-29 MED ORDER — SODIUM CHLORIDE 0.9% FLUSH
3.0000 mL | Freq: Two times a day (BID) | INTRAVENOUS | Status: DC
  Administered 2021-01-29 – 2021-02-02 (×7): 3 mL via INTRAVENOUS

## 2021-01-29 MED ORDER — FLUTICASONE PROPIONATE 50 MCG/ACT NA SUSP
2.0000 | Freq: Every day | NASAL | Status: DC
  Administered 2021-01-29 – 2021-02-03 (×6): 2 via NASAL
  Filled 2021-01-29: qty 16

## 2021-01-29 MED ORDER — PANTOPRAZOLE SODIUM 40 MG PO TBEC
40.0000 mg | DELAYED_RELEASE_TABLET | Freq: Every day | ORAL | Status: DC
  Administered 2021-01-29 – 2021-02-03 (×6): 40 mg via ORAL
  Filled 2021-01-29 (×6): qty 1

## 2021-01-29 MED ORDER — FUROSEMIDE 10 MG/ML IJ SOLN
40.0000 mg | Freq: Once | INTRAMUSCULAR | Status: AC
  Administered 2021-01-29: 40 mg via INTRAVENOUS
  Filled 2021-01-29: qty 4

## 2021-01-29 MED ORDER — NITROGLYCERIN 0.4 MG SL SUBL: 0.4000 mg | SUBLINGUAL_TABLET | SUBLINGUAL | Status: DC | PRN

## 2021-01-29 MED ORDER — DILTIAZEM HCL ER COATED BEADS 120 MG PO CP24
120.0000 mg | ORAL_CAPSULE | Freq: Every day | ORAL | Status: DC
  Administered 2021-01-29 – 2021-01-30 (×2): 120 mg via ORAL
  Filled 2021-01-29 (×2): qty 1

## 2021-01-29 MED ORDER — INSULIN GLARGINE 100 UNIT/ML ~~LOC~~ SOLN
12.0000 [IU] | Freq: Every day | SUBCUTANEOUS | Status: DC
  Administered 2021-01-29 – 2021-01-30 (×2): 12 [IU] via SUBCUTANEOUS
  Filled 2021-01-29 (×2): qty 0.12

## 2021-01-29 MED ORDER — DOFETILIDE 250 MCG PO CAPS
250.0000 ug | ORAL_CAPSULE | Freq: Two times a day (BID) | ORAL | Status: DC
  Administered 2021-01-29 – 2021-02-03 (×11): 250 ug via ORAL
  Filled 2021-01-29 (×11): qty 1

## 2021-01-29 MED ORDER — CYCLOBENZAPRINE HCL 10 MG PO TABS
10.0000 mg | ORAL_TABLET | Freq: Every day | ORAL | Status: DC | PRN
  Administered 2021-02-02: 10 mg via ORAL
  Filled 2021-01-29 (×2): qty 1

## 2021-01-29 NOTE — H&P (Signed)
History and Physical    Katherine [name] JKK:[id number] DOB: 02-Feb-1956 DOA: 01/28/2021  PCP: [name], MD  Patient coming from: Home.  Chief Complaint: Chest pain and shortness of breath.  HPI: Katherine [name] is a 65 y.o. female with history of CAD status post stenting, renal transplant, diastolic CHF, COPD, diabetes mellitus type 2 presents to the ER with complaint of chest pain and shortness of breath.  Patient states over the last 1 month patient has been having frequent chest pain substernal present even at rest increased on exertion with increasing shortness of breath and has gained weight.  Also has been having some wheezing.  Denies fever chills.  ED Course: In the ER patient was found to be wheezing and chest x-ray shows features concerning for fluid overload.  COVID test was negative.  EKG shows sinus tachycardia with LBBB.  Labs are significant for high sensitive troponin of 91 and the second was 241.  BNP 352 hemoglobin is around 11.8.  Patient was given Lasix 40 mg IV nebulizer steroids admitted for chest pain and likely COPD and CHF.  Review of Systems: As per HPI, rest all negative.   Past Medical History:  Diagnosis Date   Anemia    Anxiety    Bilateral carotid artery stenosis    Carotid duplex 02/1695: 7-89% LICA, 38-10% RICA, >17% RECA, f/u 1 yr suggested   CAD (coronary artery disease) of bypass graft 5/01; 3/'02, 8/'03, 10/'04; 1/15   PCI x 5 to SVG-D1    CAD in native artery 07/1993   3 Vessel Disease (LAD-D1 & RCA) -- CABG (Dx in setting of inferior STEMI-PTCA of RCA)   CAD S/P percutaneous coronary angioplasty    PCI to SVG-D1 insertion/native D1 x 4 = '01 -(S660 BMS 2.5 x 9 anastomosis- D1); '02 - distal overlap ACS Pixel 2.5 x 8  BMS; '03 distal/native ISR/Thrombosis - Pixel 2.5 x 13; '04 - ISR-  Taxus 2.5 x 20 (covered all);; 1/15 - mid SVG-D1 (50% distal ISR) - Promus P 2.75 x 20 -- 2.8 mm   COPD mixed type (HCC)    Followed by Dr. Lamonte Sakai  "pulmonologist said no COPD"   Depression    Depression with anxiety    Diabetes mellitus type 2 in obese ([place])    Diarrhea    started after cholecystectomy and mass removed from intestine   Dyslipidemia, goal LDL below 70    08/2012: TC 137, TG 200, HDL 32!, LDL 45; on statin (followed by Dr.Deterding)   ESRD (end stage renal disease) ([place]) 1991   s/p Cadaveric Renal Transplant [facility] - Dr. Jimmy Footman)    Family history of adverse reaction to anesthesia    mom's bp dropped during/after anesthesia   Fibromyalgia    GERD (gastroesophageal reflux disease)    Glomerulonephritis, chronic, rapidly progressive 1989   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 07/1993   Rescue PTCA of RCA -- referred for CABG.   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 Servando Snare: LIMA-LAD, SVG-bifurcatingD1, SVG-rPDA   History of kidney stones    History of stroke 2012   "right eye stroke- half blind now"   Hypertension associated with diabetes ([place])    Mild aortic stenosis by prior echocardiogram 07/2019   Echo:  Mild aortic stenosis (gradients: Mean 14.3 mmHg -peak 24.9 mmHg).   Morbid obesity ([place])  MRSA (methicillin resistant staph aureus) culture positive    OSA (obstructive sleep apnea)    no longer on CPAP or home O2, states she doesn't need now after lap band   PAD (peripheral artery disease) ([place]) 08/2013   LEA Dopplers to be read by Dr. Fletcher Anon   PAF (paroxysmal atrial fibrillation) ([place]) 06/2014   Noted on CardioNet Monitor  - --> rhythm control with Tikosyn (Dr. Rayann Heman); converted from warfarin to apixaban for anticoagulation.   Pneumonia    Recurrent boils    Bilateral Groin   Rheumatoid arthritis ([place])    Per Patient Report; associated with OA   S/p cadaver renal transplant 1991   [facility]   Unstable angina ([place]) 5/01; 3/'02, 8/'03, 10/'04; 1/15   x 5 occurences since Inf-Post STEMI in 1994    Past Surgical History:   Procedure Laterality Date   ABDOMINAL AORTOGRAM N/A 04/21/2018   Procedure: ABDOMINAL AORTOGRAM;  Surgeon: [name], MD;  Location: [place] [facility];  Service: Cardiovascular;  Laterality: N/A;   CATHETER REMOVAL     CHOLECYSTECTOMY N/A 10/29/2014   Procedure: LAPAROSCOPIC CHOLECYSTECTOMY WITH INTRAOPERATIVE CHOLANGIOGRAM;  Surgeon: Excell Seltzer, MD;  Location: [facility];  Service: General;  Laterality: N/A;   CORONARY ANGIOPLASTY  1994   x5   CORONARY ARTERY BYPASS GRAFT  1995   LIMA-LAD, SVG-RPDA, SVG-D1   ESOPHAGOGASTRODUODENOSCOPY N/A 10/15/2016   Procedure: ESOPHAGOGASTRODUODENOSCOPY (EGD);  Surgeon: Wilford Corner, MD;  Location: [facility] ENDOSCOPY;  Service: Endoscopy;  Laterality: N/A;   I & D EXTREMITY Right 01/29/2018   Procedure: IRRIGATION AND DEBRIDEMENT THUMB;  Surgeon: Dayna Barker, MD;  Location: [place];  Service: Plastics;  Laterality: Right;   INCISE AND DRAIN ABCESS     KIDNEY TRANSPLANT  1991   KNEE ARTHROSCOPY WITH LATERAL MENISECTOMY Left 12/03/2017   Procedure: LEFT KNEE ARTHROSCOPY WITH LATERAL MENISECTOMY;  Surgeon: Earlie Server, MD;  Location: [facility];  Service: Orthopedics;  Laterality: Left;   LAPAROSCOPIC GASTRIC BANDING  04/2004; 10/'09, 2/'10   Port Replacement x 2   LEFT HEART CATH AND CORS/GRAFTS ANGIOGRAPHY N/A 04/21/2018   Procedure: LEFT HEART CATH AND CORS/GRAFTS ANGIOGRAPHY;  Surgeon: [name], MD;  Location: [facility];  Ost-Prox LAD 50% - proxLAD (pre & post D1) 100% CTO. Cx - patent, small OM1 (stable ~ ostial OM1 90%, too small for PCI) & 2 small LPL; Ost-distal RCA 100% CTO.  LIMA-LAD (not injected); SVG-dRCA patent, SVG-D1 - insertion stent ~20% ISR - Severe R CFA disease w/ focal Sub TO   LEFT HEART CATH AND CORS/GRAFTS ANGIOGRAPHY  5/'01, 3/'02, 8/'03, 10/'04; 1/'15   08/22/2013: LAD & RCA 100%; LIMA-LAD & SVG-rPDA patent; Cx-- OM1 60%, OM2 ostial ~50%; SVG-D1 - 80% mid, 50% distal ISR --PCI   LEFT HEART CATHETERIZATION WITH  CORONARY/GRAFT ANGIOGRAM N/A 08/23/2013   Procedure: LEFT HEART CATHETERIZATION WITH Beatrix Fetters;  Surgeon: Wellington Hampshire, MD;  Location: [facility] CATH LAB;  Service: Cardiovascular;  Laterality: N/A;   Lower Extremity Arterial Dopplers  08/2013   ABI: R 0.96, L 1.04   MULTIPLE TOOTH EXTRACTIONS  age 18   NM MYOVIEW LTD  03/2016   EF 62%. LOW RISK. C/W prior MI - no Ischemia. Apical hypokinesis.   PERCUTANEOUS CORONARY STENT INTERVENTION (PCI-S)  5/'01, 3/'02, 8/'03, 10/'04;   '01 - S660 BMS 2.5 x 9 - dSVG-D1 into D1; '02- post-stent stenosis - 2.5 x 8 Pixel BMS; '8\03: ISR/Thrombosis into native D1 - AngioJet, 2.5 x 13  Pixel; '04 - ISR 95% - covered stented area with Taxus DES 2.5 mm x 20 (2.88)   PERCUTANEOUS CORONARY STENT INTERVENTION (PCI-S)  08/23/2013   Procedure: PERCUTANEOUS CORONARY STENT INTERVENTION (PCI-S);  Surgeon: Wellington Hampshire, MD;  Location: [facility] CATH LAB;  Service: Cardiovascular;;mid SVG-D1 80%; distal stent ~50% ISR; Promus Prermier DES 2.75 mm xc 20 mm (2.8 mm)   PORT-A-CATH REMOVAL     kidney   TRANSTHORACIC ECHOCARDIOGRAM  07/2019   EF 55 to 60%.  No LVH.  Paradoxical septal wall motion-post CABG.  GRII DD.  Normal RV size and function.  Mild bilateral atrial dilation.  Moderate MAC.  Trace MR.  Mild aortic stenosis (gradients: Mean 14.3 mmHg -peak 24.9 mmHg).   TUBAL LIGATION     wrist fistula repair Left    dialysis for one year     reports that she quit smoking about 18 years ago. Her smoking use included cigarettes. She has a 30.00 pack-year smoking history. She has never used smokeless tobacco. She reports that she does not drink alcohol and does not use drugs.  Allergies  Allergen Reactions   Tetracycline Hives    Patient tolerated Doxycycline Dec 2020   Niacin Other (See Comments)    Mouth blisters   Niaspan [Niacin Er] Other (See Comments)    Mouth blisters   Sulfa Antibiotics Nausea Only and Other (See Comments)    "Tears up stomach"    Sulfonamide Derivatives Other (See Comments)    Reaction: per patient "tears her stomach up"   Codeine Nausea And Vomiting   Erythromycin Nausea And Vomiting   Hydromorphone Hcl Nausea And Vomiting   Morphine And Related Nausea And Vomiting   Nalbuphine Nausea And Vomiting    Nubain   Sulfasalazine Nausea Only and Other (See Comments)    per patient "tears her stomach up", "Tears up stomach"   Tape Rash and Other (See Comments)    No "plastic" tape," please----cloth tape only    Family History  Problem Relation Age of Onset   Cancer Mother        liver   Heart disease Father    Cancer Father        colon   Arrhythmia Brother        Atrial Fibrillation   Arrhythmia Paternal Aunt        Atrial Fibrillation    Prior to Admission medications   Medication Sig Start Date End Date Taking? Authorizing Provider  acetaminophen (TYLENOL) 500 MG tablet Take 1,500 mg by mouth daily.   Yes [provider]  albuterol (VENTOLIN HFA) 108 (90 Base) MCG/ACT inhaler Inhale 2 puffs into the lungs every 6 (six) hours as needed for wheezing or shortness of breath. 10/30/20  Yes Collene Gobble, MD  arformoterol (BROVANA) 15 MCG/2ML NEBU Take 2 mLs (15 mcg total) by nebulization 2 (two) times daily. Patient taking differently: Take 15 mcg by nebulization 2 (two) times daily. Mix with budesonide 08/30/19  Yes Byrum, Rose Fillers, MD  azaTHIOprine (IMURAN) 50 MG tablet Take 125 mg by mouth daily.    Yes [provider]  B-D ULTRAFINE III SHORT PEN 31G X 8 MM MISC See admin instructions. 11/06/19  Yes [provider]  Blood Glucose Monitoring Suppl (ONE TOUCH ULTRA 2) w/Device KIT 2 (two) times daily. 04/08/20  Yes [provider]  budesonide (PULMICORT) 0.5 MG/2ML nebulizer solution Take 2 mLs (0.5 mg total) by nebulization 2 (two) times daily. Patient taking differently: Take 2  mLs by nebulization 2 (two) times daily. Mix with Brovana 08/24/19  Yes [name], MD   calcitRIOL (ROCALTROL) 0.25 MCG capsule Take 0.25 mcg by mouth every 3 (three) days.    Yes [provider]  clotrimazole (MYCELEX) 10 MG troche Take 1 tablet (10 mg total) by mouth 5 (five) times daily. Patient taking differently: Take 10 mg by mouth 5 (five) times daily as needed (thrush). 11/28/18  Yes Parrett, Tammy S, NP  cyclobenzaprine (FLEXERIL) 10 MG tablet Take 1-2 tablets (10-20 mg total) by mouth See admin instructions. Take two tablets (20 mg) by mouth daily at bedtime, may also take one tablet (10 mg) at noon as needed for muscle spasms 08/19/20  Yes Meredith Staggers, MD  diltiazem (CARDIZEM CD) 120 MG 24 hr capsule TAKE 1 CAPSULE BY MOUTH EVERY DAY Patient taking differently: Take 120 mg by mouth daily. 12/07/19  Yes [name], MD  diltiazem (CARDIZEM) 30 MG tablet TAKE 1 TABLET EVERY 4 HOURS AS NEEDED FOR AFIB HEART RATE >100 Patient taking differently: Take 30 mg by mouth every 4 (four) hours as needed. afib heart rate >100 12/27/20  Yes Sherran Needs, NP  dofetilide (TIKOSYN) 250 MCG capsule Take 1 capsule (250 mcg total) by mouth 2 (two) times daily. 08/13/20  Yes [name], MD  DULoxetine (CYMBALTA) 30 MG capsule TAKE 2 CAPSULES (60 MG TOTAL) BY MOUTH EVERY MORNING. Patient taking differently: Take 60 mg by mouth daily. 10/30/20  Yes Thomas, Eunice L, NP  ELIQUIS 5 MG TABS tablet TAKE 1 TABLET BY MOUTH TWICE A DAY (TO REPLACE WARFARIN) Patient taking differently: Take 5 mg by mouth 2 (two) times daily. 10/02/20  Yes [name], MD  fluticasone (FLONASE) 50 MCG/ACT nasal spray PLACE 2 SPRAYS INTO BOTH NOSTRILS 2 TIMES DAILY Patient taking differently: Place 2 sprays into both nostrils in the morning and at bedtime. 12/17/20  Yes Collene Gobble, MD  furosemide (LASIX) 40 MG tablet Take 1 tablet (40 mg total) by mouth daily. 07/14/20  Yes Dwyane Dee, MD  glimepiride (AMARYL) 4 MG tablet Take 4 mg by mouth daily. 02/03/20  Yes [provider]   insulin aspart (NOVOLOG FLEXPEN) 100 UNIT/ML FlexPen Inject 14 Units into the skin 3 (three) times daily with meals.   Yes [provider]  Insulin Glargine (BASAGLAR KWIKPEN) 100 UNIT/ML SOPN Inject 12 Units into the skin daily before breakfast.    Yes [provider]  lamoTRIgine (LAMICTAL) 25 MG tablet TAKE 1 TABLET BY MOUTH EVERYDAY AT BEDTIME Patient taking differently: Take 25 mg by mouth at bedtime. 11/01/20  Yes Meredith Staggers, MD  Lancets [facility] DELICA PLUS [id number]) MISC daily. as directed 03/26/20  Yes [provider]  LORazepam (ATIVAN) 1 MG tablet Take 1 tablet (1 mg total) by mouth 2 (two) times daily. 09/12/20  Yes [name], MD  metFORMIN (GLUCOPHAGE) 500 MG tablet Take 500 mg by mouth 2 (two) times daily. 10/03/16  Yes [provider]  metoprolol tartrate (LOPRESSOR) 50 MG tablet Take 1 tablet (50 mg total) by mouth 2 (two) times daily. Appointment Required For Further Refills [phone] 01/22/21  Yes [name], MD  montelukast (SINGULAIR) 10 MG tablet Take 1 tablet (10 mg total) by mouth at bedtime. 10/30/20  Yes Collene Gobble, MD  nitroGLYCERIN (NITROSTAT) 0.4 MG SL tablet Place 1 tablet (0.4 mg total) under the tongue every 5 (five) minutes as needed for chest pain. 09/11/13  Yes [name], MD  omeprazole (PRILOSEC) 20 MG capsule Take 20 mg by mouth daily.   Yes [provider]  ONETOUCH ULTRA test strip USE AS DIRECTED 3 TIMES DAILY TO CHECK BLOOD SUGAR 11/04/19  Yes [provider]  Oxycodone HCl 10 MG TABS Take 1 tablet (10 mg total) by mouth 3 (three) times daily as needed. Patient taking differently: Take 10 mg by mouth 3 (three) times daily as needed (pain). 01/24/21  Yes Meredith Staggers, MD  OXYGEN Inhale 3 L into the lungs continuous.   Yes [provider]  predniSONE (DELTASONE) 5 MG tablet Take 5 mg by mouth daily. 06/19/19  Yes [provider]  promethazine (PHENERGAN) 25 MG  tablet Take 1 tablet (25 mg total) by mouth at bedtime as needed for nausea. For nausea and sleep 07/14/20  Yes Dwyane Dee, MD  rosuvastatin (CRESTOR) 20 MG tablet TAKE 1 TABLET BY MOUTH EVERY DAY Patient taking differently: Take 20 mg by mouth daily. 10/30/20  Yes [name], MD  sertraline (ZOLOFT) 100 MG tablet Take 100 mg by mouth daily. 06/12/20  Yes [provider]  albuterol (PROVENTIL) (2.5 MG/3ML) 0.083% nebulizer solution Take 3 mLs (2.5 mg total) by nebulization every 6 (six) hours as needed for wheezing or shortness of breath (dx: J45.20). Patient not taking: No sig reported 03/15/19   Parrett, Fonnie Mu, NP  doxycycline (VIBRA-TABS) 100 MG tablet Take 1 tablet (100 mg total) by mouth 2 (two) times daily. Patient not taking: Reported on 01/29/2021 11/11/20   Collene Gobble, MD    Physical Exam: Constitutional: Moderately built and nourished. Vitals:   01/29/21 0145 01/29/21 0200 01/29/21 0416 01/29/21 0447  BP: (!) 131/110 (!) 125/42 (!) 149/66 (!) 164/85  Pulse: 83 84 86 91  Resp: 16 (!) _0 Temp:   98.6 F (37 C) 99.3 F (37.4 C)  TempSrc:    Oral  SpO2: 96% 99% 97% 97%  Weight:      Height:       Eyes: Anicteric no pallor. ENMT: No discharge from the ears eyes nose and mouth. Neck: No mass felt.  No neck rigidity.  JVD elevated. Respiratory: No rhonchi or crepitations. Cardiovascular: S1-S2 heard. Abdomen: Soft nontender bowel sounds present. Musculoskeletal: Mild edema. Skin: No rash. Neurologic: Alert awake oriented to time place and person.  Moves all extremities. Psychiatric: Appears normal.  Normal affect.   Labs on Admission: I have personally reviewed following labs and imaging studies  CBC: Recent Labs  Lab 01/29/21 0030 01/29/21 0152  WBC 8.1  --   NEUTROABS 5.2  --   HGB 11.8* 11.9*  HCT 37.5 35.0*  MCV 96.6  --   PLT 222  --    Basic Metabolic Panel: Recent Labs  Lab 01/29/21 0030 01/29/21 0152  NA 135 135  K 4.2  4.0  CL 98  --   CO2 25  --   GLUCOSE 268*  --   BUN 14  --   CREATININE 0.90  --   CALCIUM 9.6  --    GFR: Estimated Creatinine Clearance: 68.4 mL/min (by C-G formula based on SCr of 0.9 mg/dL). Liver Function Tests: Recent Labs  Lab 01/29/21 0030  AST 18  ALT 14  ALKPHOS 99  BILITOT 0.5  PROT 7.2  ALBUMIN 3.4*   No results for input(s): LIPASE, AMYLASE in the last 168 hours. No results for input(s): AMMONIA in the last 168 hours. Coagulation Profile:  No results for input(s): INR, PROTIME in the last 168 hours. Cardiac Enzymes: No results for input(s): CKTOTAL, CKMB, CKMBINDEX, TROPONINI in the last 168 hours. BNP (last 3 results) No results for input(s): PROBNP in the last 8760 hours. HbA1C: No results for input(s): HGBA1C in the last 72 hours. CBG: No results for input(s): GLUCAP in the last 168 hours. Lipid Profile: No results for input(s): CHOL, HDL, LDLCALC, TRIG, CHOLHDL, LDLDIRECT in the last 72 hours. Thyroid Function Tests: No results for input(s): TSH, T4TOTAL, FREET4, T3FREE, THYROIDAB in the last 72 hours. Anemia Panel: No results for input(s): VITAMINB12, FOLATE, FERRITIN, TIBC, IRON, RETICCTPCT in the last 72 hours. Urine analysis:    Component Value Date/Time   COLORURINE YELLOW 10/30/2020 1759   APPEARANCEUR CLEAR 10/30/2020 1759   LABSPEC 1.009 10/30/2020 1759   PHURINE 7.0 10/30/2020 1759   GLUCOSEU 50 (A) 10/30/2020 1759   GLUCOSEU 100 (A) 08/13/2020 1550   HGBUR NEGATIVE 10/30/2020 1759   BILIRUBINUR NEGATIVE 10/30/2020 1759   KETONESUR NEGATIVE 10/30/2020 1759   PROTEINUR NEGATIVE 10/30/2020 1759   UROBILINOGEN 0.2 08/13/2020 1550   NITRITE NEGATIVE 10/30/2020 1759   LEUKOCYTESUR NEGATIVE 10/30/2020 1759   Sepsis Labs: _0 (procalcitonin:4,lacticidven:4) ) Recent Results (from the past 240 hour(s))  Resp Panel by RT-PCR (Flu A&B, Covid) Nasopharyngeal Swab     Status: None   Collection Time: 01/29/21  1:06 AM   Specimen:  Nasopharyngeal Swab; Nasopharyngeal(NP) swabs in vial transport medium  Result Value Ref Range Status   SARS Coronavirus 2 by RT PCR NEGATIVE NEGATIVE Final    Comment: (NOTE) SARS-CoV-2 target nucleic acids are NOT DETECTED.  The SARS-CoV-2 RNA is generally detectable in upper respiratory specimens during the acute phase of infection. The lowest concentration of SARS-CoV-2 viral copies this assay can detect is 138 copies/mL. A negative result does not preclude SARS-Cov-2 infection and should not be used as the sole basis for treatment or other patient management decisions. A negative result may occur with  improper specimen collection/handling, submission of specimen other than nasopharyngeal swab, presence of viral mutation(s) within the areas targeted by this assay, and inadequate number of viral copies(<138 copies/mL). A negative result must be combined with clinical observations, patient history, and epidemiological information. The expected result is Negative.  Fact Sheet for Patients:  EntrepreneurPulse.com.au  Fact Sheet for Healthcare Providers:  IncredibleEmployment.be  This test is no t yet approved or cleared by the [place] FDA and  has been authorized for detection and/or diagnosis of SARS-CoV-2 by FDA under an Emergency Use Authorization (EUA). This EUA will remain  in effect (meaning this test can be used) for the duration of the COVID-19 declaration under Section 564(b)(1) of the Act, 21 U.S.C.section 360bbb-3(b)(1), unless the authorization is terminated  or revoked sooner.       Influenza A by PCR NEGATIVE NEGATIVE Final   Influenza B by PCR NEGATIVE NEGATIVE Final    Comment: (NOTE) The Xpert Xpress SARS-CoV-2/FLU/RSV plus assay is intended as an aid in the diagnosis of influenza from Nasopharyngeal swab specimens and should not be used as a sole basis for treatment. Nasal washings and aspirates are unacceptable for  Xpert Xpress SARS-CoV-2/FLU/RSV testing.  Fact Sheet for Patients: EntrepreneurPulse.com.au  Fact Sheet for Healthcare Providers: IncredibleEmployment.be  This test is not yet approved or cleared by the [place] FDA and has been authorized for detection and/or diagnosis of SARS-CoV-2 by FDA under an Emergency Use Authorization (EUA). This EUA will remain in effect (meaning this test can  be used) for the duration of the COVID-19 declaration under Section 564(b)(1) of the Act, 21 U.S.C. section 360bbb-3(b)(1), unless the authorization is terminated or revoked.  Performed at [facility], [place] [address], [place], [place] [address]      Radiological Exams on Admission: CT Head Wo Contrast  Result Date: 01/29/2021 CLINICAL DATA:  Delirium and head trauma. EXAM: CT HEAD WITHOUT CONTRAST TECHNIQUE: Contiguous axial images were obtained from the base of the skull through the vertex without intravenous contrast. COMPARISON:  04/06/2017 FINDINGS: Brain: There is no mass, hemorrhage or extra-axial collection. The size and configuration of the ventricles and extra-axial CSF spaces are normal. There is hypoattenuation of the white matter, most commonly indicating chronic small vessel disease. Vascular: No abnormal hyperdensity of the major intracranial arteries or dural venous sinuses. No intracranial atherosclerosis. Skull: The visualized skull base, calvarium and extracranial soft tissues are normal. Sinuses/Orbits: No fluid levels or advanced mucosal thickening of the visualized paranasal sinuses. No mastoid or middle ear effusion. The orbits are normal. IMPRESSION: Chronic small vessel disease without acute intracranial abnormality. Electronically Signed   By: Ulyses Jarred M.D.   On: 01/29/2021 02:03   DG Chest Portable 1 View  Result Date: 01/29/2021 CLINICAL DATA:  Shortness of breath EXAM: PORTABLE CHEST 1 VIEW COMPARISON:  None. FINDINGS: Mild  cardiomegaly. Remote median sternotomy. Unchanged mild interstitial opacity. The visualized skeletal structures are unremarkable. IMPRESSION: Cardiomegaly and mild interstitial pulmonary edema. Electronically Signed   By: Ulyses Jarred M.D.   On: 01/29/2021 00:55    EKG: Independently reviewed.  Sinus tachycardia with LBBB.  Assessment/Plan Principal Problem:   Acute on chronic diastolic CHF (congestive heart failure) (HCC) Active Problems:   PAD (peripheral artery disease) (HCC)   Type 2 diabetes mellitus with peripheral neuropathy (HCC)   Renal transplant, status post   Obstructive chronic bronchitis without exacerbation (HCC)   Acute CHF (congestive heart failure) (HCC)    Acute on chronic combined systolic and diastolic CHF last EF measured was 40 to 45% with grade 2 diastolic dysfunction dyspnea on Lasix 40 mg IV every 12+ intake output Daily weights and metabolic panel. Chest pain with elevated troponin with history of CAD status post stenting will consult cardiology.  Presently chest pain-free.  Cycle markers patient is on Eliquis statins and beta-blockers. COPD with mild exacerbation at the time of my exam patient's wheezing is improved.  We will continue inhalers.  Patient is already on prednisone. Renal transplant on immunosuppressant Imuran and prednisone. A. fib rate controlled at this time.  Continue Cardizem Tikosyn metoprolol and apixaban. Diabetes mellitus type 2 with hyperglycemia on sliding scale coverage. Anemia follow CBC. Chronic pain on oxycodone.   DVT prophylaxis: Apixaban. Code Status: Full code. Family Communication: Discussed with patient. Disposition Plan: Home. Consults called: Cardiology. Admission status: Observation.   Rise Patience MD [facility] Pager [phone].  If 7PM-7AM, please contact night-coverage [email or web address] Password [email or web address]  01/29/2021, 6:01 AM

## 2021-01-29 NOTE — ED Triage Notes (Signed)
Pt bib gems orginally for SOB and weakness. Currently on 3L home o2. Pt fell on Sunday in her tub. Unknown LOC, unknown head trauma. Pt on blood thinners. Pt also states she has been having chest pain x1 month and coughing up yellow sputum. EMS reports pt experiencing intermittent aphasia.   CBG: 300  Spo2: 93% 3L  HR: 89

## 2021-01-29 NOTE — Progress Notes (Signed)
PROGRESS NOTE    Katherine [name]  XBM:[id number] DOB: 01-18-1956 DOA: 01/28/2021 PCP: [name], MD      Brief Narrative:  Katherine [name] is a 65 y.o. 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  In the last few days this is gotten progressively worse to the point it was severe and she noted she was gaining weight and wheezing.  In the ER, fully, bilateral interstitial changes.  EKG showed new left bundle, troponin only low 200s.  She was given IV Lasix and the hospitalist service were asked to evaluate.         Assessment & Plan:  Acute on chronic systolic CHF EF 02-72% last Nov.  Here with shortness of breath with exertion, cardiomegaly, pulmonary edema on chest x-ray.  -Furosemide 40 mg IV twice a day  -K supplement -Strict I/Os, daily weights, telemetry  -Daily monitoring renal function -Consult Cardiology, appreciate cares -Stop diltiazem -Continue BB, Crestor    Elevated troponin Suspect this is demand ischemia from CHF, doubt the low level troponin elevation reflects ACS. - Consult cardiology, appreciate recommendations  Renal transplant Renal function at baseline - Continue Imuran, prednisone  History of rheumatoid arthritis No evidence of flare -Continue Imuran, prednisone  Chronic pain -Continue home oxycodone, Flexeril, Cymbalta  Paroxysmal atrial fibrillation -Continue dofetilide, reviewed with cardiology - Stop diltiazem - Continue apixaban - Continue metoprolol  Diabetes, type II, complicated by hyperglycemia -Continue sliding scale corrections - Continue Lantus -Hold glimepiride, metformin   Anemia of chronic disease/rheumatoid arthritis Hemoglobin at baseline  COPD -Continue LABA/LAMA - Continue  bronchodilators - Continue Singulair, PPI, fluticasone  Anxiety - Continue sertraline, Lamictal         Disposition: Status is: Inpatient   Remains inpatient appropriate because: requires ongoing IV Lasix, diannostic work up for troponin not apporpriate for outpatient work up  [employer]: The patient is from: Home              Anticipated d/c is to: Home              Patient currently is not medically stable to d/c.   Difficult to place patient No                 MDM: This is a no charge note.  For further details, please see H&P by my partner Dr. Hal Hope from earlier today.  The below labs and imaging reports were reviewed and summarized above.    DVT prophyla xis:  apixaban (ELIQUIS) tablet 5 mg  Code Status: FULL Family Communication:             Subjective: Still having some chest discomfort, she is sad about her niece moving to [place] to be in [employer] and now she will not be around her grandniece.  No fever.  Still out of breath.        Objective: Vitals:   01/29/21 0447 01/29/21 0734 01/29/21 0756 01/29/21 1214  BP: (!) 164/85 (!) 147/74  140/63  Pulse: 91 96  76  Resp: 20 20  20   Temp: 99.3 F (37.4 C) 98.5 F (36.9 C)  98.3 F (36.8 C)  TempSrc: Oral Oral  Oral  SpO2: 97% 95% 95% 94%  Weight:      Height:  Intake/Output Summary (Last 24 hours) at 01/29/2021 1525 Last data filed at 01/29/2021 1414 Gross per 24 hour  Intake 240 ml  Output 2800 ml  Net -2560 ml   Filed Weights   01/29/21 0005  Weight: 97.5 kg    Examination: The patient was seen and examined.      Data Reviewed: I have personally reviewed following labs and imaging studies:  CBC: Recent Labs  Lab 01/29/21 0030 01/29/21 0152 01/29/21 0625  WBC 8.1  --  7.4  NEUTROABS 5.2  --  6.9  HGB 11.8* 11.9* 11.3*  HCT 37.5 35.0* 34.9*  MCV 96.6  --  94.1  PLT 222  --  350   Basic Metabolic Panel: Recent Labs  Lab 01/29/21 0030  01/29/21 0152 01/29/21 0625  NA 135 135 134*  K 4.2 4.0 3.6  CL 98  --  96*  CO2 25  --  26  GLUCOSE 268*  --  419*  BUN 14  --  14  CREATININE 0.90  --  0.97  CALCIUM 9.6  --  9.2   GFR: Estimated Creatinine Clearance: 63.4 mL/min (by C-G formula based on SCr of 0.97 mg/dL). Liver Function Tests: Recent Labs  Lab 01/29/21 0030 01/29/21 0625  AST 18 17  ALT 14 12  ALKPHOS 99 96  BILITOT 0.5 0.9  PROT 7.2 6.9  ALBUMIN 3.4* 3.3*   No results for input(s): LIPASE, AMYLASE in the last 168 hours. No results for input(s): AMMONIA in the last 168 hours. Coagulation Profile: No results for input(s): INR, PROTIME in the last 168 hours. Cardiac Enzymes: No results for input(s): CKTOTAL, CKMB, CKMBINDEX, TROPONINI in the last 168 hours. BNP (last 3 results) No results for input(s): PROBNP in the last 8760 hours. HbA1C: No results for input(s): HGBA1C in the last 72 hours. CBG: Recent Labs  Lab 01/29/21 0758 01/29/21 1216  GLUCAP 433* 362*   Lipid Profile: No results for input(s): CHOL, HDL, LDLCALC, TRIG, CHOLHDL, LDLDIRECT in the last 72 hours. Thyroid Function Tests: Recent Labs    01/29/21 0625  TSH 1.176   Anemia Panel: No results for input(s): VITAMINB12, FOLATE, FERRITIN, TIBC, IRON, RETICCTPCT in the last 72 hours. Urine analysis:    Component Value Date/Time   COLORURINE YELLOW 10/30/2020 1759   APPEARANCEUR CLEAR 10/30/2020 1759   LABSPEC 1.009 10/30/2020 1759   PHURINE 7.0 10/30/2020 1759   GLUCOSEU 50 (A) 10/30/2020 1759   GLUCOSEU 100 (A) 08/13/2020 1550   HGBUR NEGATIVE 10/30/2020 1759   BILIRUBINUR NEGATIVE 10/30/2020 1759   KETONESUR NEGATIVE 10/30/2020 1759   PROTEINUR NEGATIVE 10/30/2020 1759   UROBILINOGEN 0.2 08/13/2020 1550   NITRITE NEGATIVE 10/30/2020 1759   LEUKOCYTESUR NEGATIVE 10/30/2020 1759   Sepsis Labs: @LABRCNTIP (procalcitonin:4,lacticacidven:4)  ) Recent Results (from the past 240 hour(s))  Resp Panel by RT-PCR (Flu A&B,  Covid) Nasopharyngeal Swab     Status: None   Collection Time: 01/29/21  1:06 AM   Specimen: Nasopharyngeal Swab; Nasopharyngeal(NP) swabs in vial transport medium  Result Value Ref Range Status   SARS Coronavirus 2 by RT PCR NEGATIVE NEGATIVE Final    Comment: (NOTE) SARS-CoV-2 target nucleic acids are NOT DETECTED.  The SARS-CoV-2 RNA is generally detectable in upper respiratory specimens during the acute phase of infection. The lowest concentration of SARS-CoV-2 viral copies this assay can detect is 138 copies/mL. A negative result does not preclude SARS-Cov-2 infection and should not be used as the sole basis for treatment or other patient management decisions.  A negative result may occur with  improper specimen collection/handling, submission of specimen other than nasopharyngeal swab, presence of viral mutation(s) within the areas targeted by this assay, and inadequate number of viral copies(<138 copies/mL). A negative result must be combined with clinical observations, patient history, and epidemiological information. The expected result is Negative.  Fact Sheet for Patients:  EntrepreneurPulse.com.au  Fact Sheet for Healthcare Providers:  IncredibleEmployment.be  This test is no t yet approved or cleared by the [place] FDA and  has been authorized for detection and/or diagnosis of SARS-CoV-2 by FDA under an Emergency Use Authorization (EUA). This EUA will remain  in effect (meaning this test can be used) for the duration of the COVID-19 declaration under Section 564(b)(1) of the Act, 21 U.S.C.section 360bbb-3(b)(1), unless the authorization is terminated  or revoked sooner.       Influenza A by PCR NEGATIVE NEGATIVE Final   Influenza B by PCR NEGATIVE NEGATIVE Final    Comment: (NOTE) The Xpert Xpress SARS-CoV-2/FLU/RSV plus assay is intended as an aid in the diagnosis of influenza from Nasopharyngeal swab specimens and should  not be used as a sole basis for treatment. Nasal washings and aspirates are unacceptable for Xpert Xpress SARS-CoV-2/FLU/RSV testing.  Fact Sheet for Patients: EntrepreneurPulse.com.au  Fact Sheet for Healthcare Providers: IncredibleEmployment.be  This test is not yet approved or cleared by the [place] FDA and has been authorized for detection and/or diagnosis of SARS-CoV-2 by FDA under an Emergency Use Authorization (EUA). This EUA will remain in effect (meaning this test can be used) for the duration of the COVID-19 declaration under Section 564(b)(1) of the Act, 21 U.S.C. section 360bbb-3(b)(1), unless the authorization is terminated or revoked.  Performed at [facility], [place] [address] [place], [place] [address]          Radiology Studies: CT Head Wo Contrast  Result Date: 01/29/2021 CLINICAL DATA:  Delirium and head trauma. EXAM: CT HEAD WITHOUT CONTRAST TECHNIQUE: Contiguous axial images were obtained from the base of the skull through the vertex without intravenous contrast. COMPARISON:  04/06/2017 FINDINGS: Brain: There is no mass, hemorrhage or extra-axial collection. The size and configuration of the ventricles and extra-axial CSF spaces are normal. There is hypoattenuation of the white matter, most commonly indicating chronic small vessel disease. Vascular: No abnormal hyperdensity of the major intracranial arteries or dural venous sinuses. No intracranial atherosclerosis. Skull: The visualized skull base, calvarium and extracranial soft tissues are normal. Sinuses/Orbits: No fluid levels or advanced mucosal thickening of the visualized paranasal sinuses. No mastoid or middle ear effusion. The orbits are normal. IMPRESSION: Chronic small vessel disease without acute intracranial abnormality. Electronically Signed   By: Ulyses Jarred M.D.   On: 01/29/2021 02:03   DG Chest Portable 1 View  Result Date: 01/29/2021 CLINICAL  DATA:  Shortness of breath EXAM: PORTABLE CHEST 1 VIEW COMPARISON:  None. FINDINGS: Mild cardiomegaly. Remote median sternotomy. Unchanged mild interstitial opacity. The visualized skeletal structures are unremarkable. IMPRESSION: Cardiomegaly and mild interstitial pulmonary edema. Electronically Signed   By: Ulyses Jarred M.D.   On: 01/29/2021 00:55        Scheduled Meds:  apixaban  5 mg Oral BID   arformoterol  15 mcg Nebulization BID   azaTHIOprine  125 mg Oral Daily   budesonide  2 mL Nebulization BID   [START ON 01/31/2021] calcitRIOL  0.25 mcg Oral Q3 days   cyclobenzaprine  20 mg Oral QHS   diltiazem  120 mg Oral Daily  dofetilide  250 mcg Oral BID   DULoxetine  60 mg Oral Daily   fluticasone  2 spray Each Nare Daily   furosemide  40 mg Intravenous Q12H   insulin aspart  0-9 Units Subcutaneous TID WC   insulin glargine  12 Units Subcutaneous Daily   lamoTRIgine  25 mg Oral QHS   LORazepam  1 mg Oral BID   metoprolol tartrate  50 mg Oral BID   montelukast  10 mg Oral QHS   pantoprazole  40 mg Oral Daily   potassium chloride  40 mEq Oral Q4H   predniSONE  5 mg Oral Daily   rosuvastatin  20 mg Oral Daily   sertraline  100 mg Oral Daily   Continuous Infusions:   LOS: 0 days    Time spent: 25 minutes    Edwin Dada, MD [facility] 01/29/2021, 3:25 PM     Please page though [place] or Epic secure chat:  For password, contact charge nurse

## 2021-01-29 NOTE — Consult Note (Addendum)
Cardiology Consultation:   Patient ID: Katherine [name] MRN: [id number]; DOB: 01/06/1956  Admit date: 01/28/2021 Date of Consult: 01/29/2021  PCP:  Jones, Thomas L, MD   [facility] Providers Cardiologist:  David Harding, MD        Patient Profile:   Katherine [name] is a 64 y.o. female with a hx of CAD, s/p CABG with PCI to VG-D1, persistent atrial fib on eliquis and tikosyn mainly maintaining SB, HLD, PAD, renal transplant in 1991 who is being seen 01/29/2021 for the evaluation of chest pain at the request of Dr. Danford.  History of Present Illness:   Katherine [name] with above hx with hx of ant wall MI 1994 with PTCA, CABG in 1995 (LIMA to LAD;VG to diag; VG to distal RCA). In 12/1999 she had MDT stent to VG to diag.  10/2000 needed overlapping stent to VG to Diag.  12/2001 VG to diag with stenosis causing chest pain and underwent rheolytic thrombectomy and tandem stenting. 05/2003 unstable angina and need for PCI to VG to diag at the distal anastomotic site with new Taxus stent. 09/02/2013 stenosis to VG to diag proximal to prior stents.  Patent LIMA to LAD and VG to RPDA otherwise.   LAST CATH 04/21/18 with occluded RCA and pLAD, severe but stable ostial OM1 disease, Patent VG to D1 and patent VG to dRCA.  Unable to visualize LIMA  (total of 5 PCI to VG to D1).  Last echo 10/2017 with EF 55-60% G2 DD, mild AS   PAD with mild carotid disease Lt and mod on Rt.  Last evaluated 2017 also severe calcified lesion in rt common femoral artery.    Cardiac MRI 12/27/19 for possible myocarditis.  Neg for acute myocarditis and normal aortic root and ascending aorta EF 56% with hypokinesis of mid anteroseptal, anterior and akinesis of the apical septal, anterior and inferior walls. There is late gadolinium enhancement in the mid anteroseptal, anterior walls (50-75%) and in the apical septal, anterior and inferior walls (75-100%).  PAF followed with a fib clinic and on tikosyn and eliquis, at times  she would take extra tikosyn but has been instructed not to this due to danger of lethal arrhythmias.   Other hx renal transplant , DM + tobacco use, COPD on home 02.   Now admitted early AM today with SOB and weakness.  She noted chest pain and SOB with exertion + wheezing, increased cough + productive yellow sputum and called Cardiology office and to be seen 02/03/21.  Over last couple of days has increased.  Pt fell on Sunday and she remembers falling. Some dizziness lately though.  For chest pain she would take an extra lopressor.  NTG causes migraines.  She has taken an extra lopressor every day for last several and she does think it helps with the pain.  Pain and dyspnea with activity, none at night in bed.  EKG:  The EKG was personally reviewed and demonstrates:  SR 1st degree AV block PR 216 ms and new LBBB  Telemetry:  Telemetry was personally reviewed and demonstrates:  SR  BNP 352 Hs Troponin 91; 241; 326; 305 Hgb 11.3, plts 221 WBC 7.4 Na 134, K+ 3.6 glucose 419 Cr 0.97  TSH 1.176  PCXR cardiomegaly and mild interstitial pulmonary edema.  CT Head chronic small vessel disease no acute process.  Currently BP 140/63 P 73 R 20 afebrile.   Past Medical History:  Diagnosis Date   Anemia    Anxiety      Bilateral carotid artery stenosis    Carotid duplex 01/2228: 7-98% LICA, 92-11% RICA, >94% RECA, f/u 1 yr suggested   CAD (coronary artery disease) of bypass graft 5/01; 3/'02, 8/'03, 10/'04; 1/15   PCI x 5 to SVG-D1    CAD in native artery 07/1993   3 Vessel Disease (LAD-D1 & RCA) -- CABG (Dx in setting of inferior STEMI-PTCA of RCA)   CAD S/P percutaneous coronary angioplasty    PCI to SVG-D1 insertion/native D1 x 4 = '01 -(S660 BMS 2.5 x 9 anastomosis- D1); '02 - distal overlap ACS Pixel 2.5 x 8  BMS; '03 distal/native ISR/Thrombosis - Pixel 2.5 x 13; '04 - ISR-  Taxus 2.5 x 20 (covered all);; 1/15 - mid SVG-D1 (50% distal ISR) - Promus P 2.75 x 20 -- 2.8 mm   COPD mixed type (HCC)     Followed by Dr. Lamonte Sakai "pulmonologist said no COPD"   Depression    Depression with anxiety    Diabetes mellitus type 2 in obese ([place])    Diarrhea    started after cholecystectomy and mass removed from intestine   Dyslipidemia, goal LDL below 70    08/2012: TC 137, TG 200, HDL 32!, LDL 45; on statin (followed by Dr.Deterding)   ESRD (end stage renal disease) ([place]) 1991   s/p Cadaveric Renal Transplant [facility] - Dr. Jimmy Footman)    Family history of adverse reaction to anesthesia    mom's bp dropped during/after anesthesia   Fibromyalgia    GERD (gastroesophageal reflux disease)    Glomerulonephritis, chronic, rapidly progressive 1989   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 07/1993   Rescue PTCA of RCA -- referred for CABG.   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 Servando Snare: LIMA-LAD, SVG-bifurcatingD1, SVG-rPDA   History of kidney stones    History of stroke 2012   "right eye stroke- half blind now"   Hypertension associated with diabetes ([place])    Mild aortic stenosis by prior echocardiogram 07/2019   Echo:  Mild aortic stenosis (gradients: Mean 14.3 mmHg -peak 24.9 mmHg).   Morbid obesity (HCC)    MRSA (methicillin resistant staph aureus) culture positive    OSA (obstructive sleep apnea)    no longer on CPAP or home O2, states she doesn't need now after lap band   PAD (peripheral artery disease) ([place]) 08/2013   LEA Dopplers to be read by Dr. Fletcher Anon   PAF (paroxysmal atrial fibrillation) ([place]) 06/2014   Noted on CardioNet Monitor  - --> rhythm control with Tikosyn (Dr. Rayann Heman); converted from warfarin to apixaban for anticoagulation.   Pneumonia    Recurrent boils    Bilateral Groin   Rheumatoid arthritis ([place])    Per Patient Report; associated with OA   S/p cadaver renal transplant 1991   [facility]   Unstable angina ([place]) 5/01; 3/'02, 8/'03, 10/'04; 1/15   x 5 occurences since Inf-Post STEMI in 1994     Past Surgical History:  Procedure Laterality Date   ABDOMINAL AORTOGRAM N/A 04/21/2018   Procedure: ABDOMINAL AORTOGRAM;  Surgeon: [name], MD;  Location: [facility];  Service: Cardiovascular;  Laterality: N/A;   CATHETER REMOVAL     CHOLECYSTECTOMY N/A 10/29/2014   Procedure: LAPAROSCOPIC CHOLECYSTECTOMY WITH INTRAOPERATIVE CHOLANGIOGRAM;  Surgeon: Excell Seltzer, MD;  Location: [facility];  Service: General;  Laterality: N/A;   [place]  x5   CORONARY ARTERY BYPASS GRAFT  1995   LIMA-LAD, SVG-RPDA, SVG-D1   ESOPHAGOGASTRODUODENOSCOPY N/A 10/15/2016   Procedure: ESOPHAGOGASTRODUODENOSCOPY (EGD);  Surgeon: Vincent Schooler, MD;  Location: [facility];  Service: Endoscopy;  Laterality: N/A;   I & D EXTREMITY Right 01/29/2018   Procedure: IRRIGATION AND DEBRIDEMENT THUMB;  Surgeon: Coley, Harrill, MD;  Location: [facility];  Service: Plastics;  Laterality: Right;   INCISE AND DRAIN ABCESS     KIDNEY TRANSPLANT  1991   KNEE ARTHROSCOPY WITH LATERAL MENISECTOMY Left 12/03/2017   Procedure: LEFT KNEE ARTHROSCOPY WITH LATERAL MENISECTOMY;  Surgeon: Caffrey, Daniel, MD;  Location: [facility];  Service: Orthopedics;  Laterality: Left;   LAPAROSCOPIC GASTRIC BANDING  04/2004; 10/'09, 2/'10   Port Replacement x 2   LEFT HEART CATH AND CORS/GRAFTS ANGIOGRAPHY N/A 04/21/2018   Procedure: LEFT HEART CATH AND CORS/GRAFTS ANGIOGRAPHY;  Surgeon: Harding, David W, MD;  [facility];  Ost-Prox LAD 50% - proxLAD (pre & post D1) 100% CTO. Cx - patent, small OM1 (stable ~ ostial OM1 90%, too small for PCI) & 2 small LPL; Ost-distal RCA 100% CTO.  LIMA-LAD (not injected); SVG-dRCA patent, SVG-D1 - insertion stent ~20% ISR - Severe R CFA disease w/ focal Sub TO   LEFT HEART CATH AND CORS/GRAFTS ANGIOGRAPHY  5/'01, 3/'02, 8/'03, 10/'04; 1/'15   08/22/2013: LAD & RCA 100%; LIMA-LAD & SVG-rPDA patent; Cx-- OM1 60%, OM2 ostial ~50%; SVG-D1 - 80% mid, 50% distal ISR --PCI   LEFT  HEART CATHETERIZATION WITH CORONARY/GRAFT ANGIOGRAM N/A 08/23/2013   Procedure: LEFT HEART CATHETERIZATION WITH CORONARY/GRAFT ANGIOGRAM;  Surgeon: Muhammad A Arida, MD;  [facility];  Service: Cardiovascular;  Laterality: N/A;   Lower Extremity Arterial Dopplers  08/2013   ABI: R 0.96, L 1.04   MULTIPLE TOOTH EXTRACTIONS  age 18   NM MYOVIEW LTD  03/2016   EF 62%. LOW RISK. C/W prior MI - no Ischemia. Apical hypokinesis.   PERCUTANEOUS CORONARY STENT INTERVENTION (PCI-S)  5/'01, 3/'02, 8/'03, 10/'04;   '01 - S660 BMS 2.5 x 9 - dSVG-D1 into D1; '02- post-stent stenosis - 2.5 x 8 Pixel BMS; '8\03: ISR/Thrombosis into native D1 - AngioJet, 2.5 x 13 Pixel; '04 - ISR 95% - covered stented area with Taxus DES 2.5 mm x 20 (2.88)   PERCUTANEOUS CORONARY STENT INTERVENTION (PCI-S)  08/23/2013   Procedure: PERCUTANEOUS CORONARY STENT INTERVENTION (PCI-S);  Surgeon: Muhammad A Arida, MD;  [facility]: [facility];  Service: Cardiovascular;;mid SVG-D1 80%; distal stent ~50% ISR; Promus [place] DES 2.75 mm xc 20 mm (2.8 mm)   PORT-A-CATH REMOVAL     kidney   TRANSTHORACIC ECHOCARDIOGRAM  07/2019   EF 55 to 60%.  No LVH.  Paradoxical septal wall motion-post CABG.  GRII DD.  Normal RV size and function.  Mild bilateral atrial dilation.  Moderate MAC.  Trace MR.  Mild aortic stenosis (gradients: Mean 14.3 mmHg -peak 24.9 mmHg).   TUBAL LIGATION     wrist fistula repair Left    dialysis for one year     Home Medications:  Prior to Admission medications   Medication Sig Start Date End Date Taking? Authorizing Provider  acetaminophen (TYLENOL) 500 MG tablet Take 1,500 mg by mouth daily.   Yes [provider]  albuterol (VENTOLIN HFA) 108 (90 Base) MCG/ACT inhaler Inhale 2 puffs into the lungs every 6 (six) hours as needed for wheezing or shortness of breath. 10/30/20  Yes Byrum, Robert S, MD    arformoterol (BROVANA) 15 MCG/2ML NEBU Take 2 mLs (15 mcg total) by nebulization 2 (two) times  daily. Patient taking differently: Take 15 mcg by nebulization 2 (two) times daily. Mix with budesonide 08/30/19  Yes Byrum, Robert S, MD  azaTHIOprine (IMURAN) 50 MG tablet Take 125 mg by mouth daily.    Yes [provider]  B-D ULTRAFINE III SHORT PEN 31G X 8 MM MISC See admin instructions. 11/06/19  Yes [provider]  Blood Glucose Monitoring Suppl (ONE TOUCH ULTRA 2) w/Device KIT 2 (two) times daily. 04/08/20  Yes [provider]  budesonide (PULMICORT) 0.5 MG/2ML nebulizer solution Take 2 mLs (0.5 mg total) by nebulization 2 (two) times daily. Patient taking differently: Take 2 mLs by nebulization 2 (two) times daily. Mix with Brovana 08/24/19  Yes Jones, Thomas L, MD  calcitRIOL (ROCALTROL) 0.25 MCG capsule Take 0.25 mcg by mouth every 3 (three) days.    Yes [provider]  clotrimazole (MYCELEX) 10 MG troche Take 1 tablet (10 mg total) by mouth 5 (five) times daily. Patient taking differently: Take 10 mg by mouth 5 (five) times daily as needed (thrush). 11/28/18  Yes Parrett, Tammy S, NP  cyclobenzaprine (FLEXERIL) 10 MG tablet Take 1-2 tablets (10-20 mg total) by mouth See admin instructions. Take two tablets (20 mg) by mouth daily at bedtime, may also take one tablet (10 mg) at noon as needed for muscle spasms 08/19/20  Yes Swartz, Zachary T, MD  diltiazem (CARDIZEM CD) 120 MG 24 hr capsule TAKE 1 CAPSULE BY MOUTH EVERY DAY Patient taking differently: Take 120 mg by mouth daily. 12/07/19  Yes Harding, David W, MD  diltiazem (CARDIZEM) 30 MG tablet TAKE 1 TABLET EVERY 4 HOURS AS NEEDED FOR AFIB HEART RATE >100 Patient taking differently: Take 30 mg by mouth every 4 (four) hours as needed. afib heart rate >100 12/27/20  Yes Carroll, Donna C, NP  dofetilide (TIKOSYN) 250 MCG capsule Take 1 capsule (250 mcg total) by mouth 2 (two) times daily. 08/13/20  Yes Jones, Thomas L, MD  DULoxetine (CYMBALTA) 30 MG capsule TAKE 2 CAPSULES (60 MG TOTAL) BY MOUTH EVERY  MORNING. Patient taking differently: Take 60 mg by mouth daily. 10/30/20  Yes Thomas, Eunice L, NP  ELIQUIS 5 MG TABS tablet TAKE 1 TABLET BY MOUTH TWICE A DAY (TO REPLACE WARFARIN) Patient taking differently: Take 5 mg by mouth 2 (two) times daily. 10/02/20  Yes Harding, David W, MD  fluticasone (FLONASE) 50 MCG/ACT nasal spray PLACE 2 SPRAYS INTO BOTH NOSTRILS 2 TIMES DAILY Patient taking differently: Place 2 sprays into both nostrils in the morning and at bedtime. 12/17/20  Yes Byrum, Robert S, MD  furosemide (LASIX) 40 MG tablet Take 1 tablet (40 mg total) by mouth daily. 07/14/20  Yes Girguis, David, MD  glimepiride (AMARYL) 4 MG tablet Take 4 mg by mouth daily. 02/03/20  Yes [provider]  insulin aspart (NOVOLOG FLEXPEN) 100 UNIT/ML FlexPen Inject 14 Units into the skin 3 (three) times daily with meals.   Yes [provider]  Insulin Glargine (BASAGLAR KWIKPEN) 100 UNIT/ML SOPN Inject 12 Units into the skin daily before breakfast.    Yes [provider]  lamoTRIgine (LAMICTAL) 25 MG tablet TAKE 1 TABLET BY MOUTH EVERYDAY AT BEDTIME Patient taking differently: Take 25 mg by mouth at bedtime. 11/01/20  Yes Swartz, Zachary T, MD  Lancets (ONETOUCH DELICA PLUS LANCET30G) MISC daily. as directed 03/26/20  Yes [provider]  LORazepam (ATIVAN) 1 MG   tablet Take 1 tablet (1 mg total) by mouth 2 (two) times daily. 09/12/20  Yes [name], MD  metFORMIN (GLUCOPHAGE) 500 MG tablet Take 500 mg by mouth 2 (two) times daily. 10/03/16  Yes [provider]  metoprolol tartrate (LOPRESSOR) 50 MG tablet Take 1 tablet (50 mg total) by mouth 2 (two) times daily. Appointment Required For Further Refills [phone] 01/22/21  Yes [name], MD  montelukast (SINGULAIR) 10 MG tablet Take 1 tablet (10 mg total) by mouth at bedtime. 10/30/20  Yes Collene Gobble, MD  nitroGLYCERIN (NITROSTAT) 0.4 MG SL tablet Place 1 tablet (0.4 mg total) under the tongue every 5  (five) minutes as needed for chest pain. 09/11/13  Yes [name], MD  omeprazole (PRILOSEC) 20 MG capsule Take 20 mg by mouth daily.   Yes [provider]  ONETOUCH ULTRA test strip USE AS DIRECTED 3 TIMES DAILY TO CHECK BLOOD SUGAR 11/04/19  Yes [provider]  Oxycodone HCl 10 MG TABS Take 1 tablet (10 mg total) by mouth 3 (three) times daily as needed. Patient taking differently: Take 10 mg by mouth 3 (three) times daily as needed (pain). 01/24/21  Yes Meredith Staggers, MD  OXYGEN Inhale 3 L into the lungs continuous.   Yes [provider]  predniSONE (DELTASONE) 5 MG tablet Take 5 mg by mouth daily. 06/19/19  Yes [provider]  promethazine (PHENERGAN) 25 MG tablet Take 1 tablet (25 mg total) by mouth at bedtime as needed for nausea. For nausea and sleep 07/14/20  Yes Dwyane Dee, MD  rosuvastatin (CRESTOR) 20 MG tablet TAKE 1 TABLET BY MOUTH EVERY DAY Patient taking differently: Take 20 mg by mouth daily. 10/30/20  Yes [name], MD  sertraline (ZOLOFT) 100 MG tablet Take 100 mg by mouth daily. 06/12/20  Yes [provider]  albuterol (PROVENTIL) (2.5 MG/3ML) 0.083% nebulizer solution Take 3 mLs (2.5 mg total) by nebulization every 6 (six) hours as needed for wheezing or shortness of breath (dx: J45.20). Patient not taking: No sig reported 03/15/19   Parrett, Fonnie Mu, NP  doxycycline (VIBRA-TABS) 100 MG tablet Take 1 tablet (100 mg total) by mouth 2 (two) times daily. Patient not taking: Reported on 01/29/2021 11/11/20   Collene Gobble, MD    Inpatient Medications: Scheduled Meds:  apixaban  5 mg Oral BID   arformoterol  15 mcg Nebulization BID   azaTHIOprine  125 mg Oral Daily   budesonide  2 mL Nebulization BID   [START ON 01/31/2021] calcitRIOL  0.25 mcg Oral Q3 days   cyclobenzaprine  20 mg Oral QHS   diltiazem  120 mg Oral Daily   dofetilide  250 mcg Oral BID   DULoxetine  60 mg Oral Daily   fluticasone  2 spray Each Nare  Daily   furosemide  40 mg Intravenous Q12H   insulin aspart  0-9 Units Subcutaneous TID WC   insulin glargine  12 Units Subcutaneous Daily   lamoTRIgine  25 mg Oral QHS   LORazepam  1 mg Oral BID   metoprolol tartrate  50 mg Oral BID   montelukast  10 mg Oral QHS   pantoprazole  40 mg Oral Daily   potassium chloride  40 mEq Oral Q4H   predniSONE  5 mg Oral Daily   rosuvastatin  20 mg Oral Daily   sertraline  100 mg Oral Daily   Continuous Infusions:  PRN Meds: acetaminophen **OR** acetaminophen, albuterol, cyclobenzaprine, nitroGLYCERIN,  oxyCODONE, promethazine  Allergies:    Allergies  Allergen Reactions   Tetracycline Hives    Patient tolerated Doxycycline Dec 2020   Niacin Other (See Comments)    Mouth blisters   Niaspan [Niacin Er] Other (See Comments)    Mouth blisters   Sulfa Antibiotics Nausea Only and Other (See Comments)    "Tears up stomach"   Sulfonamide Derivatives Other (See Comments)    Reaction: per patient "tears her stomach up"   Codeine Nausea And Vomiting   Erythromycin Nausea And Vomiting   Hydromorphone Hcl Nausea And Vomiting   Morphine And Related Nausea And Vomiting   Nalbuphine Nausea And Vomiting    Nubain   Sulfasalazine Nausea Only and Other (See Comments)    per patient "tears her stomach up", "Tears up stomach"   Tape Rash and Other (See Comments)    No "plastic" tape," please----cloth tape only    Social History:   Social History   Socioeconomic History   Marital status: Widowed    Spouse name: Not on file   Number of children: Not on file   Years of education: Not on file   Highest education level: Not on file  Occupational History   Not on file  Tobacco Use   Smoking status: Former    Packs/day: 1.00    Years: 30.00    Pack years: 30.00    Types: Cigarettes    Quit date: 08/17/2002    Years since quitting: 18.4   Smokeless tobacco: Never  Vaping Use   Vaping Use: Never used  Substance and Sexual Activity   Alcohol use:  No   Drug use: No   Sexual activity: Not on file  Other Topics Concern   Not on file  Social History Narrative   She is currently married, and the caregiver of her husband who is recovering from surgery for tongue cancer now diagnosed with lung cancer. Prior to his diagnosis of her husband, she actually had adopted a 51-year-old child who she knows caring for as well. With all the surgeries, they have been quite financially troubled. Thanks the help of her community and church, they have been able to stay "alfoat."     She is a former smoker who quit in 2004 after a 30-pack-year history.   She is active chasing a 27-year-old child, does not do routine exercise. She's been quite depressed with the condition of her husband, and admits to eating comfort herself.   She does not drink alcohol.   Social Determinants of Health   Financial Resource Strain: Not on file  Food Insecurity: Not on file  Transportation Needs: Not on file  Physical Activity: Not on file  Stress: Not on file  Social Connections: Not on file  Intimate Partner Violence: Not on file    Family History:    Family History  Problem Relation Age of Onset   Cancer Mother        liver   Heart disease Father    Cancer Father        colon   Arrhythmia Brother        Atrial Fibrillation   Arrhythmia Paternal Aunt        Atrial Fibrillation     ROS:  Please see the history of present illness.  General:no colds or fevers, believes her wt is up Skin:no rashes or ulcers HEENT:no blurred vision, no congestion CV:see HPI PUL:see HPI GI:no diarrhea constipation or melena, no indigestion GU:no hematuria, no dysuria  MS:no joint pain, no claudication Neuro:no syncope, occ lightheadedness Endo:+ diabetes, no thyroid disease  All other ROS reviewed and negative.     Physical Exam/Data:   Vitals:   01/29/21 0447 01/29/21 0734 01/29/21 0756 01/29/21 1214  BP: (!) 164/85 (!) 147/74  140/63  Pulse: 91 96  76  Resp: _0 Temp: 99.3 F (37.4 C) 98.5 F (36.9 C)  98.3 F (36.8 C)  TempSrc: Oral Oral  Oral  SpO2: 97% 95% 95% 94%  Weight:      Height:        Intake/Output Summary (Last 24 hours) at 01/29/2021 1531 Last data filed at 01/29/2021 1414 Gross per 24 hour  Intake 240 ml  Output 2800 ml  Net -2560 ml   Last 3 Weights 01/29/2021 12/25/2020 10/30/2020  Weight (lbs) 215 lb 210 lb 233 lb 0.4 oz  Weight (kg) 97.523 kg 95.255 kg 105.7 kg     Body mass index is 40.62 kg/m.  General:  Well nourished, well developed, in no acute distress though has to stop and think about answer she feels she is jumping from one thing to another and was to see Neuro HEENT: normal Lymph: no adenopathy Neck: mild JVD Endocrine:  No thryomegaly Vascular: No carotid bruits; pedal pulses 1+ bilaterally  Cardiac:  normal S1, S2; RRR; no murmur gallup or rub Lungs:  diminished  to auscultation bilaterally, occ wheezing, rhonchi or rales  Abd: soft, nontender, no hepatomegaly  Ext: no edema- currently Musculoskeletal:  No deformities, BUE and BLE strength normal and equal Skin: warm and dry  Neuro:  alert and oriented X 3 MAE follows commands, no focal abnormalities noted Psych:  Normal affect    Relevant CV Studies: 04/21/2018 Diagnostic Dominance: Right    Intervention LV end diastolic pressure is normal. _________________________________________________ Colon Flattery LAD to Prox LAD lesion is 50% stenosed. Prox LAD-1 lesion is 100% stenosed prior to D1. Prox LAD-2 lesion is 100% stenosed after D1. SVG-D1 graft was visualized by angiography and is large. Dist Graft to Insertion STENT is 20% stenosed. The Native Cx is widely patent with a small 1st Marg & 2 Small distal LPL branches. Ost 1st Mrg lesion is 90% stenosed. Small Caliber Vessel - not good PCI/PTCA target Ost RCA to Dist RCA lesion is 100% stenosed. - Unable to engage. (known occlusion) SVG-dRCA graft was visualized by angiography and is large. The graft  exhibits minimal luminal irregularities. LIMA graft was not injected. -Due to inability to access the right common femoral artery and desire to avoid the left femoral access because of transplanted kidney, right radial access was chosen (she has a left arm fistula) _________________________________________________ Severe right common femoral artery disease with focal subtotal occlusion.   Severe native coronary disease with occluded RCA and proximal LAD.  Severe, but stable ostial OM1 disease Patent SVG-D1 with stent in place. Patent SVG-dRCA Due to access issues, unable to visualize LIMA graft. Severe focal calcified lesion in the right common femoral artery: Unable to obtain femoral arterial access despite using ultrasound guidance -unable to advance wire.   No obvious lesions found for PCI.  Unfortunately still unable to evaluate the LIMA graft, however this is unlikely to have an issue as it was widely patent and previous cath. May need to consider noninvasive evaluation versus left femoral access if necessary to visualize LIMA.   Patient will be transferred to [facility] postprocedural unit for Rad Stat removal.   Restart IV heparin 8 hours after  sheath removal.   Continue treatment of A. fib RVR, I suspect that her anginal symptoms were related to A. fib RVR with existing CAD and supply versus demand ischemia.     Recommend Aspirin 61m daily for moderate CAD.     ECHO 06/2020  IMPRESSIONS    1. There is no left ventricular thrombus with Definity contrast. 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 Left ventricular ejection fraction, by  estimation, is 40 to 45%. The left ventricle has mildly decreased  function. The left ventricle demonstrates  regional wall motion abnormalities (see scoring diagram/findings for  description). There is moderate concentric left ventricular hypertrophy.  Left ventricular diastolic parameters are consistent with Grade II  diastolic dysfunction  (pseudonormalization).  Elevated left atrial pressure. There is moderate hypokinesis of the left  ventricular, mid-apical anterior wall and anteroseptal wall. There is mild  dyskinesis of the left ventricular, entire apical segment.   2. Right ventricular systolic function is normal. The right ventricular  size is normal. Tricuspid regurgitation signal is inadequate for assessing  PA pressure.   3. Left atrial size was moderately dilated.   4. The mitral valve is normal in structure. Mild mitral valve  regurgitation. Moderate mitral annular calcification.   5. The aortic valve is bicuspid. Aortic valve regurgitation is not  visualized. Mild aortic valve sclerosis is present, with no evidence of  aortic valve stenosis.   6. The inferior vena cava is normal in size with greater than 50%  respiratory variability, suggesting right atrial pressure of 3 mmHg.   Comparison(s): Prior images reviewed side by side. Similar left  ventricular wall motion abnormalities and EF were probably present on the  previous study, but not well identified without the use of Definity  contrast.   FINDINGS   Left Ventricle: There is no left ventricular thrombus with Definity  contrast. 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 Left ventricular ejection  fraction, by estimation, is 40 to 45%. The left ventricle has mildly  decreased function. The left ventricle  demonstrates regional wall motion abnormalities. Moderate hypokinesis of  the left ventricular, mid-apical anterior wall and anteroseptal wall. Mild  dyskinesis of the left ventricular, entire apical segment. Definity  contrast agent was given IV to  delineate the left ventricular endocardial borders. The left ventricular  internal cavity size was normal in size. There is moderate concentric left  ventricular hypertrophy. Left ventricular diastolic parameters are  consistent with Grade II diastolic  dysfunction (pseudonormalization). Elevated left atrial  pressure.      Laboratory Data:  High Sensitivity Troponin:   Recent Labs  Lab 01/29/21 0030 01/29/21 0230 01/29/21 0625 01/29/21 0746  TROPONINIHS 91* 241* 326* 305*     Chemistry Recent Labs  Lab 01/29/21 0030 01/29/21 0152 01/29/21 0625  NA 135 135 134*  K 4.2 4.0 3.6  CL 98  --  96*  CO2 25  --  26  GLUCOSE 268*  --  419*  BUN 14  --  14  CREATININE 0.90  --  0.97  CALCIUM 9.6  --  9.2  GFRNONAA >60  --  >60  ANIONGAP 12  --  12    Recent Labs  Lab 01/29/21 0030 01/29/21 0625  PROT 7.2 6.9  ALBUMIN 3.4* 3.3*  AST 18 17  ALT 14 12  ALKPHOS 99 96  BILITOT 0.5 0.9   Hematology Recent Labs  Lab 01/29/21 0030 01/29/21 0152 01/29/21 0625  WBC 8.1  --  7.4  RBC 3.88  --  3.71*  HGB 11.8* 11.9* 11.3*  HCT 37.5 35.0* 34.9*  MCV 96.6  --  94.1  MCH 30.4  --  30.5  MCHC 31.5  --  32.4  RDW 15.3  --  15.3  PLT 222  --  221   BNP Recent Labs  Lab 01/29/21 0030  BNP 352.2*    DDimer No results for input(s): DDIMER in the last 168 hours.   Radiology/Studies:  CT Head Wo Contrast  Result Date: 01/29/2021 CLINICAL DATA:  Delirium and head trauma. EXAM: CT HEAD WITHOUT CONTRAST TECHNIQUE: Contiguous axial images were obtained from the base of the skull through the vertex without intravenous contrast. COMPARISON:  04/06/2017 FINDINGS: Brain: There is no mass, hemorrhage or extra-axial collection. The size and configuration of the ventricles and extra-axial CSF spaces are normal. There is hypoattenuation of the white matter, most commonly indicating chronic small vessel disease. Vascular: No abnormal hyperdensity of the major intracranial arteries or dural venous sinuses. No intracranial atherosclerosis. Skull: The visualized skull base, calvarium and extracranial soft tissues are normal. Sinuses/Orbits: No fluid levels or advanced mucosal thickening of the visualized paranasal sinuses. No mastoid or middle ear effusion. The orbits are normal. IMPRESSION: Chronic  small vessel disease without acute intracranial abnormality. Electronically Signed   By: Kevin  Herman M.D.   On: 01/29/2021 02:03   DG Chest Portable 1 View  Result Date: 01/29/2021 CLINICAL DATA:  Shortness of breath EXAM: PORTABLE CHEST 1 VIEW COMPARISON:  None. FINDINGS: Mild cardiomegaly. Remote median sternotomy. Unchanged mild interstitial opacity. The visualized skeletal structures are unremarkable. IMPRESSION: Cardiomegaly and mild interstitial pulmonary edema. Electronically Signed   By: Kevin  Herman M.D.   On: 01/29/2021 00:55     Assessment and Plan:   Acute on chronic combined systolic and diastolic CHF. Given lasix 40 mg this AM neg 2560 since admit. (Home lasix dose 40 mg daily) echo 5/21 with normal EF and G3 DD and repeat in Nov 2021 with drop in EF to 40-45% and G2 DD and small apical aneurysm There is moderate hypokinesis of the left ventricular, mid-apical anterior wall and anteroseptal wall. There is mild dyskinesis of the left ventricular, entire apical segment, mild MR no AS.  With decrease in EF may need to stop dilt. Will recheck echo NSTEMI/Angina/elevated troponin pk 326 and significant CAD now EKG with new LBBB on tikosyn dilt and lopressor with extra doses an extra 50 mg daily  will plan for cardiac cath - plan for Friday with last dose Eliquis this am. CAD with MI in 1994, CABG 1995 and 5 PCI procedures to VG to Diag last one in 2015.  VG to dRCA patent in 2019 and [place] has been patent in past but unable to visualize on cath 2019. On BB, dilt statin no longer on ASA or plavix due to eliquis PAF has been maintaining SR to SB on tikosyn -  Anticoagulation on eliquis no bleeding and with recent fall no abnormality on brain CT DM per iM Hx of renal transplant with stable Cr. On immunosuppressant and steroids  Prolonged Qtc with new LBBB on tikosyn.  Will check Mg+ as well. With numerous issues may need EP to weigh in on tikosyn COPD/Asthma on home 02 and nebs Chronic pain  on oxycodone. HLD continue Crestor    Risk Assessment/Risk Scores:     TIMI Risk Score for Unstable Angina or Non-ST Elevation MI:   The patient's TIMI risk score is 5, which indicates a 26% risk of all   cause mortality, new or recurrent myocardial infarction or need for urgent revascularization in the next 14 days.  New York Heart Association (NYHA) Functional Class NYHA Class III        For questions or updates, please contact [facility] Please consult [email or web address] for contact info under    Signed, Cecilie Kicks, NP  01/29/2021 3:31 PM  Agree with note written by Cecilie Kicks RNP  Pt well known to me from past interventions. Admitted with CHF and CP. Enz +. H/O PF in NSR on Ticosyn. New LBBB. On DOAC. Trop + H/O remote CABG with SVG-->Diag stenting in past. Exam benign. Plan Cor angio on Friday. Will hold DOAC, check 2D.     Quay Burow 01/29/2021 7:31 PM

## 2021-01-29 NOTE — Progress Notes (Signed)
Heart Failure Stewardship Pharmacist Progress Note   PCP: [name], MD PCP-Cardiologist: [name], MD    HPI:  65 yo F w/ PMH of CAD s/p stents, renal transplant, diastolic CHF, COPD, and DM who presented to ED complaining of CP and SOB worsening over the past month. Notes weight gain and wheezing as well. CXR showed concern for fluid overload. Started on IV Lasix and admitted for CHF. ECHO from 06/2020 revealed EF 40-45%.   Current HF Medications: - Furosemide 40mg  BID - Metoprolol tartrate 50mg  BID  Prior to admission HF Medications: - Furosemide 40mg  daily - Metoprolol tartrate 50mg  BID  Pertinent Lab Values: Serum creatinine 0.97, BUN 14, Potassium 3.6, Sodium 134, BNP 352, A1c 7.7, CBGs 400s  Vital Signs: Weight: 215 lbs (admission weight: 215 lbs) Blood pressure: 140s/70s  Heart rate: 80s   Medication Assistance / Insurance Benefits Check: Does the patient have prescription insurance?  Yes Type of insurance plan: [place] Medicaid  Does the patient qualify for medication assistance through manufacturers or grants?   No  Outpatient Pharmacy:  Prior to admission [facility] Is the patient willing to use [facility] at discharge? Pending Is the patient willing to transition their outpatient pharmacy to utilize a [facility] outpatient pharmacy?   Pending    Assessment: 1. Acute on chronic diastolic CHF (EF 07-12%), due to NICM. NYHA class III symptoms. - Discontinue PTA diltiazem  - Consider Entresto w/ elevated BP - Consider spironolactone - Hold off on SGLT2 with elevated CBGs   Plan: 1) Medication changes recommended at this time: - Recommended d/c diltiazem - Recommended starting Entresto 24/26mg  BID  2) Patient assistance: - [name] copay $4 - Farxiga copay $4  3)  Education  - To be completed prior to discharge - Will not schedule any TOC f/u unless pt cannot make [facility] appointment on 6/20  Marya Fossa Hilliard Clark) Gordon, PharmD  Student

## 2021-01-29 NOTE — Progress Notes (Signed)
Patient arrived form [facility] A[facility]. No complaints of pain VSS. No open wounds. Body rash from long term med use. Patient complains that she can not find her "Bible bag" that has her Medications in it. She describes it as a black bag with a bible verse and a flower on it. ED nurse Verta Ellen was contacted and she said that she had not seen it at all. Ed staff searched the room could not find it.

## 2021-01-29 NOTE — Progress Notes (Addendum)
ANTICOAGULATION CONSULT NOTE - Initial Consult  Pharmacy Consult for IV Heparin Indication: chest pain/ACS and atrial fibrillation  Allergies  Allergen Reactions   Tetracycline Hives    Patient tolerated Doxycycline Dec 2020   Niacin Other (See Comments)    Mouth blisters   Niaspan [Niacin Er] Other (See Comments)    Mouth blisters   Sulfa Antibiotics Nausea Only and Other (See Comments)    "Tears up stomach"   Sulfonamide Derivatives Other (See Comments)    Reaction: per patient "tears her stomach up"   Codeine Nausea And Vomiting   Erythromycin Nausea And Vomiting   Hydromorphone Hcl Nausea And Vomiting   Morphine And Related Nausea And Vomiting   Nalbuphine Nausea And Vomiting    Nubain   Sulfasalazine Nausea Only and Other (See Comments)    per patient "tears her stomach up", "Tears up stomach"   Tape Rash and Other (See Comments)    No "plastic" tape," please----cloth tape only    Patient Measurements: Height: 5\' 1"  (154.9 cm) Weight: 97.5 kg (215 lb) IBW/kg (Calculated) : 47.8 Heparin Dosing Weight:  71.1 kg  Vital Signs: Temp: 98.3 F (36.8 C) (06/15 1214) Temp Source: Oral (06/15 1214) BP: 140/63 (06/15 1214) Pulse Rate: 76 (06/15 1214)  Labs: Recent Labs    01/29/21 0030 01/29/21 0152 01/29/21 0230 01/29/21 0625 01/29/21 0746  HGB 11.8* 11.9*  --  11.3*  --   HCT 37.5 35.0*  --  34.9*  --   PLT 222  --   --  221  --   CREATININE 0.90  --   --  0.97  --   TROPONINIHS 91*  --  241* 326* 305*    Estimated Creatinine Clearance: 63.4 mL/min (by C-G formula based on SCr of 0.97 mg/dL).   Medical History: Past Medical History:  Diagnosis Date   Anemia    Anxiety    Bilateral carotid artery stenosis    Carotid duplex 0/8144: 8-18% LICA, 56-31% RICA, >49% RECA, f/u 1 yr suggested   CAD (coronary artery disease) of bypass graft 5/01; 3/'02, 8/'03, 10/'04; 1/15   PCI x 5 to SVG-D1    CAD in native artery 07/1993   3 Vessel Disease (LAD-D1 & RCA) --  CABG (Dx in setting of inferior STEMI-PTCA of RCA)   CAD S/P percutaneous coronary angioplasty    PCI to SVG-D1 insertion/native D1 x 4 = '01 -(S660 BMS 2.5 x 9 anastomosis- D1); '02 - distal overlap ACS Pixel 2.5 x 8  BMS; '03 distal/native ISR/Thrombosis - Pixel 2.5 x 13; '04 - ISR-  Taxus 2.5 x 20 (covered all);; 1/15 - mid SVG-D1 (50% distal ISR) - Promus P 2.75 x 20 -- 2.8 mm   COPD mixed type (HCC)    Followed by Dr. Lamonte Sakai "pulmonologist said no COPD"   Depression    Depression with anxiety    Diabetes mellitus type 2 in obese ([place])    Diarrhea    started after cholecystectomy and mass removed from intestine   Dyslipidemia, goal LDL below 70    08/2012: TC 137, TG 200, HDL 32!, LDL 45; on statin (followed by Dr.Deterding)   ESRD (end stage renal disease) ([place]) 1991   s/p Cadaveric Renal Transplant ([facility] - Dr. Jimmy Footman)    Family history of adverse reaction to anesthesia    mom's bp dropped during/after anesthesia   Fibromyalgia    GERD (gastroesophageal reflux disease)    Glomerulonephritis, chronic, rapidly progressive 1989   H/O ST  elevation myocardial infarction (STEMI) of inferoposterior wall 07/1993   Rescue PTCA of RCA -- referred for CABG.   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 Servando Snare: LIMA-LAD, SVG-bifurcatingD1, SVG-rPDA   History of kidney stones    History of stroke 2012   "right eye stroke- half blind now"   Hypertension associated with diabetes ([place])    Mild aortic stenosis by prior echocardiogram 07/2019   Echo:  Mild aortic stenosis (gradients: Mean 14.3 mmHg -peak 24.9 mmHg).   Morbid obesity (HCC)    MRSA (methicillin resistant staph aureus) culture positive    OSA (obstructive sleep apnea)    no longer on CPAP or home O2, states she doesn't need now after lap band   PAD (peripheral artery disease) ([place]) 08/2013   LEA Dopplers to be read by Dr. Fletcher Anon   PAF (paroxysmal atrial fibrillation) ([place]) 06/2014    Noted on CardioNet Monitor  - --> rhythm control with Tikosyn (Dr. Rayann Heman); converted from warfarin to apixaban for anticoagulation.   Pneumonia    Recurrent boils    Bilateral Groin   Rheumatoid arthritis ([place])    Per Patient Report; associated with OA   S/p cadaver renal transplant 1991   [facility]   Unstable angina ([place]) 5/01; 3/'02, 8/'03, 10/'04; 1/15   x 5 occurences since Inf-Post STEMI in 1994    Assessment: 65 yr old woman admitted on 01/28/21 with acute on chronic combined systolic and diastolic CHF, NSTEMI/angina/elevated troponins, CAD, PAF (on apixaban, last dose at 0902 AM today), hx of renal transplant, COPD. Pharmacy is consulted to dose IV heparin for ACS, atrial fibrillation. Given recent apixaban exposure, will monitor anticoagulation using aPTT until aPTT and heparin levels correlate.  H/H 11.3/34.9, plt 221; Scr 0.97  Goal of Therapy:  Heparin level 0.3-0.7 units/ml aPTT 66-102 sec Monitor platelets by anticoagulation protocol: Yes   Plan:  Start heparin infusion (no bolus) at 850 units/hr at 2100 tonight (time next dose of apixaban would be due) Check aPTT, heparin level 6 hrs after starting heparin infusion Monitor daily aPTT, heparin level, CBC Monitor for bleeding  Gillermina Hu, PharmD, BCPS, [facility] Clinical Pharmacist 01/29/2021,4:13 PM

## 2021-01-29 NOTE — ED Notes (Signed)
Patient transported to CT 

## 2021-01-29 NOTE — ED Provider Notes (Signed)
[facility] EMERGENCY DEPARTMENT Provider Note   CSN: [id number] Arrival date & time: 01/28/21  2354     History Chief Complaint  Patient presents with   Shortness of Breath   Fall   Chest Pain   Weakness    Katherine [name] is a 65 y.o. female.  Presented to ER with concern for multiple concerns, states her chief complaint was shortness of breath.  Patient reports that she is chronically on 3 L nasal cannula.  Over the last few days has had worsening shortness of breath, increased cough, yellow/clear sputum production.  No blood.  States that on Sunday she thinks that she fell on her toe but denies hitting her head, unsure of LOC.  No fevers or chills, denies chest pain.  EMS reported intermittent confusion.  Patient denies feeling confused.  Has extensive past medical history including history of coronary artery disease, heart failure, COPD HPI     Past Medical History:  Diagnosis Date   Anemia    Anxiety    Bilateral carotid artery stenosis    Carotid duplex 10/8379: 8-40% LICA, 37-54% RICA, >36% RECA, f/u 1 yr suggested   CAD (coronary artery disease) of bypass graft 5/01; 3/'02, 8/'03, 10/'04; 1/15   PCI x 5 to SVG-D1    CAD in native artery 07/1993   3 Vessel Disease (LAD-D1 & RCA) -- CABG (Dx in setting of inferior STEMI-PTCA of RCA)   CAD S/P percutaneous coronary angioplasty    PCI to SVG-D1 insertion/native D1 x 4 = '01 -(S660 BMS 2.5 x 9 anastomosis- D1); '02 - distal overlap ACS Pixel 2.5 x 8  BMS; '03 distal/native ISR/Thrombosis - Pixel 2.5 x 13; '04 - ISR-  Taxus 2.5 x 20 (covered all);; 1/15 - mid SVG-D1 (50% distal ISR) - Promus P 2.75 x 20 -- 2.8 mm   COPD mixed type (HCC)    Followed by Dr. Lamonte Sakai "pulmonologist said no COPD"   Depression    Depression with anxiety    Diabetes mellitus type 2 in obese ([place])    Diarrhea    started after cholecystectomy and mass removed from intestine   Dyslipidemia, goal LDL below 70    08/2012: TC 137,  TG 200, HDL 32!, LDL 45; on statin (followed by Dr.Deterding)   ESRD (end stage renal disease) ([place]) 1991   s/p Cadaveric Renal Transplant [facility] - Dr. Jimmy Footman)    Family history of adverse reaction to anesthesia    mom's bp dropped during/after anesthesia   Fibromyalgia    GERD (gastroesophageal reflux disease)    Glomerulonephritis, chronic, rapidly progressive 1989   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 07/1993   Rescue PTCA of RCA -- referred for CABG.   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 Servando Snare: LIMA-LAD, SVG-bifurcatingD1, SVG-rPDA   History of kidney stones    History of stroke 2012   "right eye stroke- half blind now"   Hypertension associated with diabetes ([place])    Mild aortic stenosis by prior echocardiogram 07/2019   Echo:  Mild aortic stenosis (gradients: Mean 14.3 mmHg -peak 24.9 mmHg).   Morbid obesity (HCC)    MRSA (methicillin resistant staph aureus) culture positive    OSA (obstructive sleep apnea)    no longer on CPAP or home O2, states she doesn't need now after lap band   PAD (peripheral artery disease) (  [place]) 08/2013   LEA Dopplers to be read by Dr. Fletcher Anon   PAF (paroxysmal atrial fibrillation) ([place]) 06/2014   Noted on CardioNet Monitor  - --> rhythm control with Tikosyn (Dr. Rayann Heman); converted from warfarin to apixaban for anticoagulation.   Pneumonia    Recurrent boils    Bilateral Groin   Rheumatoid arthritis ([place])    Per Patient Report; associated with OA   S/p cadaver renal transplant 1991   [facility]   Unstable angina ([place]) 5/01; 3/'02, 8/'03, 10/'04; 1/15   x 5 occurences since Inf-Post STEMI in 1994    Patient Active Problem List   Diagnosis Date Noted   Chronic respiratory failure with hypoxia ([place]) 01/01/2021   Flu vaccine need 08/13/2020   Need for pneumococcal vaccination 08/13/2020   Visit for screening mammogram 08/13/2020   Candidal skin infection  08/13/2020   GAD (generalized anxiety disorder) 08/13/2020   Chronic bronchitis, mucopurulent ([place]) 08/24/2019   Current chronic use of systemic steroids 07/23/2019   OSA (obstructive sleep apnea)    Severe episode of recurrent major depressive disorder, with psychotic features ([place]) 02/16/2019   Obstructive chronic bronchitis without exacerbation ([place]) 11/03/2018   Long term (current) use of anticoagulants 04/28/2018   Renal transplant, status post 01/28/2018   Primary osteoarthritis of right knee 08/18/2017   Mild aortic stenosis by prior echocardiogram 01/28/2017   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   Deficiency anemia 10/14/2016   Normocytic anemia 10/13/2016   Fibromyalgia 03/30/2016   Other spondylosis with radiculopathy, lumbar region 03/30/2016   Type 2 diabetes mellitus with peripheral neuropathy ([place]) 03/30/2016   Paroxysmal atrial fibrillation ([place]); CHA2DS2VASc score F, HTN, CAD, CVA = 5 06/28/2014   Hypertension associated with diabetes ([place])    PAD (peripheral artery disease) ([place]) 08/17/2013   Stenosis of right carotid artery without infarction 10/08/2012   Dyslipidemia, goal LDL below 70 10/08/2012    Class: Diagnosis of   Mitral annular calcification 10/08/2012   Hypomagnesemia 06/13/2012   Renal transplant disorder 06/12/2012   Chronic allergic rhinitis 04/29/2011   Extrinsic asthma 09/09/2007   GERD 09/09/2007   COUGH, CHRONIC 09/09/2007   Morbid obesity - s/p Lap Band 9/'05 05/07/2004    Class: Diagnosis of   S/P CABG (coronary artery bypass graft) x 3 09/07/1993    Class: History of   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 07/1993   Coronary artery disease involving native coronary artery of native heart with angina pectoris ([place]) 07/1993   ESRD (end stage renal disease) ([place]) 1991    Past Surgical History:  Procedure Laterality Date   ABDOMINAL AORTOGRAM N/A 04/21/2018   Procedure: ABDOMINAL AORTOGRAM;  Surgeon: [name],  MD;  Location: [place] CV LAB;  Service: Cardiovascular;  Laterality: N/A;   CATHETER REMOVAL     CHOLECYSTECTOMY N/A 10/29/2014   Procedure: LAPAROSCOPIC CHOLECYSTECTOMY WITH INTRAOPERATIVE CHOLANGIOGRAM;  Surgeon: Excell Seltzer, MD;  Location: [facility];  Service: General;  Laterality: N/A;   CORONARY ANGIOPLASTY  1994   x5   CORONARY ARTERY BYPASS GRAFT  1995   LIMA-LAD, SVG-RPDA, SVG-D1   ESOPHAGOGASTRODUODENOSCOPY N/A 10/15/2016   Procedure: ESOPHAGOGASTRODUODENOSCOPY (EGD);  Surgeon: Wilford Corner, MD;  Location: [facility] ENDOSCOPY;  Service: Endoscopy;  Laterality: N/A;   I & D EXTREMITY Right 01/29/2018   Procedure: IRRIGATION AND DEBRIDEMENT THUMB;  Surgeon: Dayna Barker, MD;  Location: [place];  Service: Plastics;  Laterality: Right;   INCISE AND DRAIN [place]  KNEE ARTHROSCOPY WITH LATERAL MENISECTOMY Left 12/03/2017   Procedure: LEFT KNEE ARTHROSCOPY WITH LATERAL MENISECTOMY;  Surgeon: Earlie Server, MD;  Location: [place];  Service: Orthopedics;  Laterality: Left;   LAPAROSCOPIC GASTRIC BANDING  04/2004; 10/'09, 2/'10   Port Replacement x 2   LEFT HEART CATH AND CORS/GRAFTS ANGIOGRAPHY N/A 04/21/2018   Procedure: LEFT HEART CATH AND CORS/GRAFTS ANGIOGRAPHY;  Surgeon: [name], MD;  Location: [place] CV LAB;  Ost-Prox LAD 50% - proxLAD (pre & post D1) 100% CTO. Cx - patent, small OM1 (stable ~ ostial OM1 90%, too small for PCI) & 2 small LPL; Ost-distal RCA 100% CTO.  LIMA-LAD (not injected); SVG-dRCA patent, SVG-D1 - insertion stent ~20% ISR - Severe R CFA disease w/ focal Sub TO   LEFT HEART CATH AND CORS/GRAFTS ANGIOGRAPHY  5/'01, 3/'02, 8/'03, 10/'04; 1/'15   08/22/2013: LAD & RCA 100%; LIMA-LAD & SVG-rPDA patent; Cx-- OM1 60%, OM2 ostial ~50%; SVG-D1 - 80% mid, 50% distal ISR --PCI   LEFT HEART CATHETERIZATION WITH CORONARY/GRAFT ANGIOGRAM N/A 08/23/2013   Procedure: LEFT HEART CATHETERIZATION WITH Beatrix Fetters;  Surgeon: Wellington Hampshire, MD;  Location: [place] CATH LAB;  Service: Cardiovascular;  Laterality: N/A;   Lower Extremity Arterial Dopplers  08/2013   ABI: R 0.96, L 1.04   MULTIPLE TOOTH EXTRACTIONS  age 4   NM MYOVIEW LTD  03/2016   EF 62%. LOW RISK. C/W prior MI - no Ischemia. Apical hypokinesis.   PERCUTANEOUS CORONARY STENT INTERVENTION (PCI-S)  5/'01, 3/'02, 8/'03, 10/'04;   '01 - S660 BMS 2.5 x 9 - dSVG-D1 into D1; '02- post-stent stenosis - 2.5 x 8 Pixel BMS; '8\03: ISR/Thrombosis into native D1 - AngioJet, 2.5 x 13 Pixel; '04 - ISR 95% - covered stented area with Taxus DES 2.5 mm x 20 (2.88)   PERCUTANEOUS CORONARY STENT INTERVENTION (PCI-S)  08/23/2013   Procedure: PERCUTANEOUS CORONARY STENT INTERVENTION (PCI-S);  Surgeon: Wellington Hampshire, MD;  Location: [facility] CATH LAB;  Service: Cardiovascular;;mid SVG-D1 80%; distal stent ~50% ISR; Promus [place] DES 2.75 mm xc 20 mm (2.8 mm)   PORT-A-CATH REMOVAL     kidney   TRANSTHORACIC ECHOCARDIOGRAM  07/2019   EF 55 to 60%.  No LVH.  Paradoxical septal wall motion-post CABG.  GRII DD.  Normal RV size and function.  Mild bilateral atrial dilation.  Moderate MAC.  Trace MR.  Mild aortic stenosis (gradients: Mean 14.3 mmHg -peak 24.9 mmHg).   TUBAL LIGATION     wrist fistula repair Left    dialysis for one year     OB History   No obstetric history on file.     Family History  Problem Relation Age of Onset   Cancer Mother        liver   Heart disease Father    Cancer Father        colon   Arrhythmia Brother        Atrial Fibrillation   Arrhythmia Paternal Aunt        Atrial Fibrillation    Social History   Tobacco Use   Smoking status: Former    Packs/day: 1.00    Years: 30.00    Pack years: 30.00    Types: Cigarettes    Quit date: 08/17/2002    Years since quitting: 18.4   Smokeless tobacco: Never  Vaping Use   Vaping Use: Never used  Substance Use Topics   Alcohol use: No   Drug use: No  Home Medications Prior to Admission medications    Medication Sig Start Date End Date Taking? Authorizing Provider  acetaminophen (TYLENOL) 500 MG tablet Take 1,500 mg by mouth daily.    [provider]  albuterol (PROVENTIL) (2.5 MG/3ML) 0.083% nebulizer solution Take 3 mLs (2.5 mg total) by nebulization every 6 (six) hours as needed for wheezing or shortness of breath (dx: J45.20). 03/15/19   Parrett, Fonnie Mu, NP  albuterol (VENTOLIN HFA) 108 (90 Base) MCG/ACT inhaler Inhale 2 puffs into the lungs every 6 (six) hours as needed for wheezing or shortness of breath. 10/30/20   Collene Gobble, MD  arformoterol (BROVANA) 15 MCG/2ML NEBU Take 2 mLs (15 mcg total) by nebulization 2 (two) times daily. Patient taking differently: Take 15 mcg by nebulization 2 (two) times daily. Mix with budesonide 08/30/19   Collene Gobble, MD  azaTHIOprine (IMURAN) 50 MG tablet Take 125 mg by mouth daily.     [provider]  B-D ULTRAFINE III SHORT PEN 31G X 8 MM MISC See admin instructions. 11/06/19   [provider]  Blood Glucose Monitoring Suppl (ONE TOUCH ULTRA 2) w/Device KIT 2 (two) times daily. 04/08/20   [provider]  budesonide (PULMICORT) 0.5 MG/2ML nebulizer solution Take 2 mLs (0.5 mg total) by nebulization 2 (two) times daily. Patient taking differently: Take 2 mLs by nebulization 2 (two) times daily. Mix with Garlon Hatchet 08/24/19   [name], MD  calcitRIOL (ROCALTROL) 0.25 MCG capsule Take 0.25 mcg by mouth every 3 (three) days.     [provider]  cefdinir (OMNICEF) 300 MG capsule Take 300 mg by mouth 2 (two) times daily. 12/29/20   [provider]  cephALEXin (KEFLEX) 500 MG capsule Take 500 mg by mouth 2 (two) times daily. 09/05/20   [provider]  clotrimazole (MYCELEX) 10 MG troche Take 1 tablet (10 mg total) by mouth 5 (five) times daily. Patient taking differently: Take 10 mg by mouth 5 (five) times daily as needed (thrush). 11/28/18   Parrett, Fonnie Mu, NP  cyclobenzaprine (FLEXERIL)  10 MG tablet Take 1-2 tablets (10-20 mg total) by mouth See admin instructions. Take two tablets (20 mg) by mouth daily at bedtime, may also take one tablet (10 mg) at noon as needed for muscle spasms 08/19/20   Meredith Staggers, MD  diltiazem (CARDIZEM CD) 120 MG 24 hr capsule TAKE 1 CAPSULE BY MOUTH EVERY DAY Patient taking differently: Take 120 mg by mouth daily. 12/07/19   [name], MD  diltiazem (CARDIZEM) 30 MG tablet TAKE 1 TABLET EVERY 4 HOURS AS NEEDED FOR AFIB HEART RATE >100 12/27/20   Sherran Needs, NP  dofetilide (TIKOSYN) 250 MCG capsule Take 1 capsule (250 mcg total) by mouth 2 (two) times daily. 08/13/20   [name], MD  doxycycline (VIBRA-TABS) 100 MG tablet Take 1 tablet (100 mg total) by mouth 2 (two) times daily. 11/11/20   Collene Gobble, MD  DULoxetine (CYMBALTA) 30 MG capsule TAKE 2 CAPSULES (60 MG TOTAL) BY MOUTH EVERY MORNING. 10/30/20   Bayard Hugger, NP  ELIQUIS 5 MG TABS tablet TAKE 1 TABLET BY MOUTH TWICE A DAY (TO REPLACE WARFARIN) 10/02/20   [name], MD  fluticasone [facility]) 50 MCG/ACT nasal spray PLACE 2 SPRAYS INTO BOTH NOSTRILS 2 TIMES DAILY 12/17/20   Collene Gobble, MD  furosemide (LASIX) 40 MG tablet Take 1 tablet (40 mg total) by mouth daily. 07/14/20   Dwyane Dee, MD  glimepiride (AMARYL) 4 MG tablet Take 4 mg by mouth daily. 02/03/20   [provider]  insulin aspart (NOVOLOG FLEXPEN) 100 UNIT/ML FlexPen Inject 10 Units into the skin 2 (two) times daily after a meal.    [provider]  Insulin Glargine (BASAGLAR KWIKPEN) 100 UNIT/ML SOPN Inject 12 Units into the skin daily before breakfast.     [provider]  lamoTRIgine (LAMICTAL) 25 MG tablet TAKE 1 TABLET BY MOUTH EVERYDAY AT BEDTIME 11/01/20   Meredith Staggers, MD  Lancets [facility] DELICA PLUS [id number]) MISC daily. as directed 03/26/20   [provider]  LORazepam (ATIVAN) 1 MG tablet Take 1 tablet (1 mg total) by mouth 2 (two) times daily.  09/12/20   [name], MD  metFORMIN (GLUCOPHAGE) 500 MG tablet Take 500 mg by mouth 2 (two) times daily. 10/03/16   [provider]  metoprolol tartrate (LOPRESSOR) 50 MG tablet Take 1 tablet (50 mg total) by mouth 2 (two) times daily. Appointment Required For Further Refills [phone] 01/22/21   [name], MD  montelukast (SINGULAIR) 10 MG tablet Take 1 tablet (10 mg total) by mouth at bedtime. 10/30/20   Collene Gobble, MD  nitroGLYCERIN (NITROSTAT) 0.4 MG SL tablet Place 1 tablet (0.4 mg total) under the tongue every 5 (five) minutes as needed for chest pain. 09/11/13   [name], MD  omeprazole (PRILOSEC) 20 MG capsule Take 20 mg by mouth daily.    [provider]  [facility] ULTRA test strip USE AS DIRECTED 3 TIMES DAILY TO CHECK BLOOD SUGAR 11/04/19   [provider]  Oxycodone HCl 10 MG TABS Take 1 tablet (10 mg total) by mouth 3 (three) times daily as needed. 01/24/21   Meredith Staggers, MD  OXYGEN Inhale 3 L into the lungs continuous.    [provider]  predniSONE (DELTASONE) 5 MG tablet Take 5 mg by mouth daily. 06/19/19   [provider]  promethazine (PHENERGAN) 25 MG tablet Take 1 tablet (25 mg total) by mouth at bedtime as needed for nausea. For nausea and sleep 07/14/20   Dwyane Dee, MD  rosuvastatin (CRESTOR) 20 MG tablet TAKE 1 TABLET BY MOUTH EVERY DAY 10/30/20   [name], MD  sertraline (ZOLOFT) 100 MG tablet Take 100 mg by mouth daily. 06/12/20   [provider]    Allergies    Tetracycline, Niacin, Niaspan [niacin er], Sulfa antibiotics, Sulfonamide derivatives, Codeine, Erythromycin, Hydromorphone hcl, Morphine and related, Nalbuphine, Sulfasalazine, and Tape  Review of Systems   Review of Systems  Constitutional:  Negative for chills and fever.  HENT:  Negative for ear pain and sore throat.   Eyes:  Negative for pain and visual disturbance.  Respiratory:  Positive for cough and shortness of  breath.   Cardiovascular:  Negative for chest pain and palpitations.  Gastrointestinal:  Negative for abdominal pain and vomiting.  Genitourinary:  Negative for dysuria and hematuria.  Musculoskeletal:  Negative for arthralgias and back pain.  Skin:  Negative for color change and rash.  Neurological:  Negative for seizures and syncope.  All other systems reviewed and are negative.  Physical Exam Updated Vital Signs BP (!) 125/42   Pulse 84   Temp 98.5 F (36.9 C) (Oral)   Resp (!) 22   Ht _0  (1.549 m)   Wt 97.5 kg   SpO2 99%   BMI 40.62 kg/m   Physical Exam Vitals and nursing note reviewed.  Constitutional:  General: She is not in acute distress.    Appearance: She is well-developed.  HENT:     Head: Normocephalic and atraumatic.  Eyes:     Conjunctiva/sclera: Conjunctivae normal.  Cardiovascular:     Rate and Rhythm: Normal rate and regular rhythm.     Heart sounds: No murmur heard. Pulmonary:     Comments: Bilateral expiratory wheeze, some tachypnea Chest:     Chest wall: No tenderness or crepitus.  Abdominal:     Palpations: Abdomen is soft.     Tenderness: There is no abdominal tenderness.  Musculoskeletal:     Cervical back: Neck supple.     Right lower leg: No edema.     Left lower leg: No edema.  Skin:    General: Skin is warm and dry.  Neurological:     Mental Status: She is alert.     Comments: Alert and oriented x3, no aphasia, speech clear    ED Results / Procedures / Treatments   Labs (all labs ordered are listed, but only abnormal results are displayed) Labs Reviewed  CBC WITH DIFFERENTIAL/PLATELET - Abnormal; Notable for the following components:      Result Value   Hemoglobin 11.8 (*)    All other components within normal limits  COMPREHENSIVE METABOLIC PANEL - Abnormal; Notable for the following components:   Glucose, Bld 268 (*)    Albumin 3.4 (*)    All other components within normal limits  BRAIN NATRIURETIC PEPTIDE - Abnormal;  Notable for the following components:   B Natriuretic Peptide 352.2 (*)    All other components within normal limits  I-STAT VENOUS BLOOD GAS, ED - Abnormal; Notable for the following components:   pH, Ven 7.437 (*)    pCO2, Ven 41.7 (*)    pO2, Ven 96.0 (*)    Bicarbonate 28.1 (*)    Acid-Base Excess 4.0 (*)    Calcium, Ion 1.11 (*)    HCT 35.0 (*)    Hemoglobin 11.9 (*)    All other components within normal limits  TROPONIN I (HIGH SENSITIVITY) - Abnormal; Notable for the following components:   Troponin I (High Sensitivity) 91 (*)    All other components within normal limits  RESP PANEL BY RT-PCR (FLU A&B, COVID) [id number]  LACTIC ACID, PLASMA  TROPONIN I (HIGH SENSITIVITY)    EKG EKG Interpretation  Date/Time:  Wednesday January 29 2021 00:04:33 EDT Ventricular Rate:  87 PR Interval:  216 QRS Duration: 152 QT Interval:  429 QTC Calculation: 517 R Axis:   -30 Text Interpretation: Sinus rhythm Borderline prolonged PR interval Left bundle branch block Confirmed by Madalyn Rob [id number]) on 01/29/2021 2:14:46 AM  Radiology CT Head Wo Contrast  Result Date: 01/29/2021 CLINICAL DATA:  Delirium and head trauma. EXAM: CT HEAD WITHOUT CONTRAST TECHNIQUE: Contiguous axial images were obtained from the base of the skull through the vertex without intravenous contrast. COMPARISON:  04/06/2017 FINDINGS: Brain: There is no mass, hemorrhage or extra-axial collection. The size and configuration of the ventricles and extra-axial CSF spaces are normal. There is hypoattenuation of the white matter, most commonly indicating chronic small vessel disease. Vascular: No abnormal hyperdensity of the major intracranial arteries or dural venous sinuses. No intracranial atherosclerosis. Skull: The visualized skull base, calvarium and extracranial soft tissues are normal. Sinuses/Orbits: No fluid levels or advanced mucosal thickening of the visualized paranasal sinuses. No mastoid or middle ear effusion. The  orbits are normal. IMPRESSION: Chronic small vessel disease without acute intracranial abnormality. Electronically  Signed   By: Ulyses Jarred M.D.   On: 01/29/2021 02:03   DG Chest Portable 1 View  Result Date: 01/29/2021 CLINICAL DATA:  Shortness of breath EXAM: PORTABLE CHEST 1 VIEW COMPARISON:  None. FINDINGS: Mild cardiomegaly. Remote median sternotomy. Unchanged mild interstitial opacity. The visualized skeletal structures are unremarkable. IMPRESSION: Cardiomegaly and mild interstitial pulmonary edema. Electronically Signed   By: Ulyses Jarred M.D.   On: 01/29/2021 00:55    Procedures Procedures   Medications Ordered in ED Medications  ipratropium-albuterol (DUONEB) 0.5-2.5 (3) MG/3ML nebulizer solution 3 mL (has no administration in time range)  furosemide (LASIX) injection 40 mg (has no administration in time range)  methylPREDNISolone sodium succinate (SOLU-MEDROL) 125 mg/2 mL injection 125 mg (has no administration in time range)  ipratropium-albuterol (DUONEB) 0.5-2.5 (3) MG/3ML nebulizer solution 3 mL (3 mLs Nebulization Given 01/29/21 0057)    ED Course  I have reviewed the triage vital signs and the nursing notes.  Pertinent labs & imaging results that were available during my care of the patient were reviewed by me and considered in my medical decision making (see chart for details).    MDM Rules/Calculators/A&P                          65 year old lady presents to ER with concern for shortness of breath.  On exam patient noted to have bilateral expiratory wheezing, some tachypnea.  Broad work-up, mild elevation in troponin.  EKG showing left bundle branch, has prior history.  BNP is mildly elevated and chest x-ray with some interstitial pulmonary edema.  Suspect either COPD versus heart failure.  Will treat for both.  Will provide additional breathing treatment, steroids and Lasix.  Given persistent work of breathing, believe patient would benefit from admission for further  management.  Regarding reported confusion/aphasia.  Patient did not exhibit any specific aphasia when I evaluated her.  On reassessment she did not appear to have any ongoing confusion.  Her CT head was negative.  Will need to be observed but do not feel this warrants further eval right now.  Consult [facility] for admit  Final Clinical Impression(s) / ED Diagnoses Final diagnoses:  Acute on chronic heart failure, unspecified heart failure type (HCC)  Chronic obstructive pulmonary disease, unspecified COPD type ([place])  Respiratory failure, unspecified chronicity, unspecified whether with hypoxia or hypercapnia [facility])    Rx / DC Orders ED Discharge Orders     None        Lucrezia Starch, MD 01/29/21 [phone]

## 2021-01-29 NOTE — Progress Notes (Signed)
Nurse spoke with [place] charge RN from ED regarding pts bag.  Pt describes bag: black with a tree on it. Security contacted and he said that is not there.  ED RN to look for it again and contact me if found.

## 2021-01-29 NOTE — H&P (View-Only) (Signed)
Cardiology Consultation:   Patient ID: Katherine [name] MRN: [id number]; DOB: 1956/07/18  Admit date: 01/28/2021 Date of Consult: 01/29/2021  PCP:  [name], MD   [facility] Providers Cardiologist:  [name], MD        Patient Profile:   Katherine [name] is a 65 y.o. female with a hx of CAD, s/p CABG with PCI to VG-D1, persistent atrial fib on eliquis and tikosyn mainly maintaining SB, HLD, PAD, renal transplant in 1991 who is being seen 01/29/2021 for the evaluation of chest pain at the request of Dr. Loleta Books.  History of Present Illness:   Katherine [name] with above hx with hx of ant wall MI 1994 with PTCA, CABG in 1995 (LIMA to LAD;VG to diag; VG to distal RCA). In 12/1999 she had MDT stent to VG to diag.  10/2000 needed overlapping stent to VG to Diag.  12/2001 VG to diag with stenosis causing chest pain and underwent rheolytic thrombectomy and tandem stenting. 05/2003 unstable angina and need for PCI to VG to diag at the distal anastomotic site with new Taxus stent. 09/02/2013 stenosis to VG to diag proximal to prior stents.  Patent LIMA to LAD and VG to RPDA otherwise.   LAST CATH 04/21/18 with occluded RCA and pLAD, severe but stable ostial OM1 disease, Patent VG to D1 and patent VG to dRCA.  Unable to visualize LIMA  (total of 5 PCI to VG to D1).  Last echo 10/2017 with EF 55-60% G2 DD, mild AS   PAD with mild carotid disease Lt and mod on Rt.  Last evaluated 2017 also severe calcified lesion in rt common femoral artery.    Cardiac MRI 12/27/19 for possible myocarditis.  Neg for acute myocarditis and normal aortic root and ascending aorta EF 56% with hypokinesis of mid anteroseptal, anterior and akinesis of the apical septal, anterior and inferior walls. There is late gadolinium enhancement in the mid anteroseptal, anterior walls (50-75%) and in the apical septal, anterior and inferior walls (75-100%).  PAF followed with a fib clinic and on tikosyn and eliquis, at times  she would take extra tikosyn but has been instructed not to this due to danger of lethal arrhythmias.   Other hx renal transplant , DM + tobacco use, COPD on home 02.   Now admitted early AM today with SOB and weakness.  She noted chest pain and SOB with exertion + wheezing, increased cough + productive yellow sputum and called Cardiology office and to be seen 02/03/21.  Over last couple of days has increased.  Pt fell on Sunday and she remembers falling. Some dizziness lately though.  For chest pain she would take an extra lopressor.  NTG causes migraines.  She has taken an extra lopressor every day for last several and she does think it helps with the pain.  Pain and dyspnea with activity, none at night in bed.  EKG:  The EKG was personally reviewed and demonstrates:  SR 1st degree AV block PR 216 ms and new LBBB  Telemetry:  Telemetry was personally reviewed and demonstrates:  SR  BNP 352 Hs Troponin 91; 241; 326; 305 Hgb 11.3, plts 221 WBC 7.4 Na 134, K+ 3.6 glucose 419 Cr 0.97  TSH 1.176  PCXR cardiomegaly and mild interstitial pulmonary edema.  CT Head chronic small vessel disease no acute process.  Currently BP 140/63 P 73 R 20 afebrile.   Past Medical History:  Diagnosis Date   Anemia    Anxiety  Bilateral carotid artery stenosis    Carotid duplex 01/2228: 7-98% LICA, 92-11% RICA, >94% RECA, f/u 1 yr suggested   CAD (coronary artery disease) of bypass graft 5/01; 3/'02, 8/'03, 10/'04; 1/15   PCI x 5 to SVG-D1    CAD in native artery 07/1993   3 Vessel Disease (LAD-D1 & RCA) -- CABG (Dx in setting of inferior STEMI-PTCA of RCA)   CAD S/P percutaneous coronary angioplasty    PCI to SVG-D1 insertion/native D1 x 4 = '01 -(S660 BMS 2.5 x 9 anastomosis- D1); '02 - distal overlap ACS Pixel 2.5 x 8  BMS; '03 distal/native ISR/Thrombosis - Pixel 2.5 x 13; '04 - ISR-  Taxus 2.5 x 20 (covered all);; 1/15 - mid SVG-D1 (50% distal ISR) - Promus P 2.75 x 20 -- 2.8 mm   COPD mixed type (HCC)     Followed by Dr. Lamonte Sakai "pulmonologist said no COPD"   Depression    Depression with anxiety    Diabetes mellitus type 2 in obese ([place])    Diarrhea    started after cholecystectomy and mass removed from intestine   Dyslipidemia, goal LDL below 70    08/2012: TC 137, TG 200, HDL 32!, LDL 45; on statin (followed by Dr.Deterding)   ESRD (end stage renal disease) ([place]) 1991   s/p Cadaveric Renal Transplant [facility] - Dr. Jimmy Footman)    Family history of adverse reaction to anesthesia    mom's bp dropped during/after anesthesia   Fibromyalgia    GERD (gastroesophageal reflux disease)    Glomerulonephritis, chronic, rapidly progressive 1989   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 07/1993   Rescue PTCA of RCA -- referred for CABG.   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 Servando Snare: LIMA-LAD, SVG-bifurcatingD1, SVG-rPDA   History of kidney stones    History of stroke 2012   "right eye stroke- half blind now"   Hypertension associated with diabetes ([place])    Mild aortic stenosis by prior echocardiogram 07/2019   Echo:  Mild aortic stenosis (gradients: Mean 14.3 mmHg -peak 24.9 mmHg).   Morbid obesity (HCC)    MRSA (methicillin resistant staph aureus) culture positive    OSA (obstructive sleep apnea)    no longer on CPAP or home O2, states she doesn't need now after lap band   PAD (peripheral artery disease) ([place]) 08/2013   LEA Dopplers to be read by Dr. Fletcher Anon   PAF (paroxysmal atrial fibrillation) ([place]) 06/2014   Noted on CardioNet Monitor  - --> rhythm control with Tikosyn (Dr. Rayann Heman); converted from warfarin to apixaban for anticoagulation.   Pneumonia    Recurrent boils    Bilateral Groin   Rheumatoid arthritis ([place])    Per Patient Report; associated with OA   S/p cadaver renal transplant 1991   [facility]   Unstable angina ([place]) 5/01; 3/'02, 8/'03, 10/'04; 1/15   x 5 occurences since Inf-Post STEMI in 1994     Past Surgical History:  Procedure Laterality Date   ABDOMINAL AORTOGRAM N/A 04/21/2018   Procedure: ABDOMINAL AORTOGRAM;  Surgeon: [name], MD;  Location: [place] [facility];  Service: Cardiovascular;  Laterality: N/A;   CATHETER REMOVAL     CHOLECYSTECTOMY N/A 10/29/2014   Procedure: LAPAROSCOPIC CHOLECYSTECTOMY WITH INTRAOPERATIVE CHOLANGIOGRAM;  Surgeon: Excell Seltzer, MD;  Location: [facility];  Service: General;  Laterality: N/A;   [place]  x5   CORONARY ARTERY BYPASS GRAFT  1995   LIMA-LAD, SVG-RPDA, SVG-D1   ESOPHAGOGASTRODUODENOSCOPY N/A 10/15/2016   Procedure: ESOPHAGOGASTRODUODENOSCOPY (EGD);  Surgeon: Wilford Corner, MD;  Location: [facility];  Service: Endoscopy;  Laterality: N/A;   I & D EXTREMITY Right 01/29/2018   Procedure: IRRIGATION AND DEBRIDEMENT THUMB;  Surgeon: Dayna Barker, MD;  Location: [place];  Service: Plastics;  Laterality: Right;   INCISE AND DRAIN ABCESS     KIDNEY TRANSPLANT  1991   KNEE ARTHROSCOPY WITH LATERAL MENISECTOMY Left 12/03/2017   Procedure: LEFT KNEE ARTHROSCOPY WITH LATERAL MENISECTOMY;  Surgeon: Earlie Server, MD;  Location: [place];  Service: Orthopedics;  Laterality: Left;   LAPAROSCOPIC GASTRIC BANDING  04/2004; 10/'09, 2/'10   Port Replacement x 2   LEFT HEART CATH AND CORS/GRAFTS ANGIOGRAPHY N/A 04/21/2018   Procedure: LEFT HEART CATH AND CORS/GRAFTS ANGIOGRAPHY;  Surgeon: [name], MD;  Location: [place] [facility];  Ost-Prox LAD 50% - proxLAD (pre & post D1) 100% CTO. Cx - patent, small OM1 (stable ~ ostial OM1 90%, too small for PCI) & 2 small LPL; Ost-distal RCA 100% CTO.  LIMA-LAD (not injected); SVG-dRCA patent, SVG-D1 - insertion stent ~20% ISR - Severe R CFA disease w/ focal Sub TO   LEFT HEART CATH AND CORS/GRAFTS ANGIOGRAPHY  5/'01, 3/'02, 8/'03, 10/'04; 1/'15   08/22/2013: LAD & RCA 100%; LIMA-LAD & SVG-rPDA patent; Cx-- OM1 60%, OM2 ostial ~50%; SVG-D1 - 80% mid, 50% distal ISR --PCI   LEFT  HEART CATHETERIZATION WITH CORONARY/GRAFT ANGIOGRAM N/A 08/23/2013   Procedure: LEFT HEART CATHETERIZATION WITH Beatrix Fetters;  Surgeon: Wellington Hampshire, MD;  Location: [place] C[facility] LAB;  Service: Cardiovascular;  Laterality: N/A;   Lower Extremity Arterial Dopplers  08/2013   ABI: R 0.96, L 1.04   MULTIPLE TOOTH EXTRACTIONS  age 84   NM MYOVIEW LTD  03/2016   EF 62%. LOW RISK. C/W prior MI - no Ischemia. Apical hypokinesis.   PERCUTANEOUS CORONARY STENT INTERVENTION (PCI-S)  5/'01, 3/'02, 8/'03, 10/'04;   '01 - S660 BMS 2.5 x 9 - dSVG-D1 into D1; '02- post-stent stenosis - 2.5 x 8 Pixel BMS; '8\03: ISR/Thrombosis into native D1 - AngioJet, 2.5 x 13 Pixel; '04 - ISR 95% - covered stented area with Taxus DES 2.5 mm x 20 (2.88)   PERCUTANEOUS CORONARY STENT INTERVENTION (PCI-S)  08/23/2013   Procedure: PERCUTANEOUS CORONARY STENT INTERVENTION (PCI-S);  Surgeon: Wellington Hampshire, MD;  Location: [facility] CATH LAB;  Service: Cardiovascular;;mid SVG-D1 80%; distal stent ~50% ISR; Promus [place]rmier DES 2.75 mm xc 20 mm (2.8 mm)   PORT-A-CATH REMOVAL     kidney   TRANSTHORACIC ECHOCARDIOGRAM  07/2019   EF 55 to 60%.  No LVH.  Paradoxical septal wall motion-post CABG.  GRII DD.  Normal RV size and function.  Mild bilateral atrial dilation.  Moderate MAC.  Trace MR.  Mild aortic stenosis (gradients: Mean 14.3 mmHg -peak 24.9 mmHg).   TUBAL LIGATION     wrist fistula repair Left    dialysis for one year     Home Medications:  Prior to Admission medications   Medication Sig Start Date End Date Taking? Authorizing Provider  acetaminophen (TYLENOL) 500 MG tablet Take 1,500 mg by mouth daily.   Yes [provider]  albuterol (VENTOLIN HFA) 108 (90 Base) MCG/ACT inhaler Inhale 2 puffs into the lungs every 6 (six) hours as needed for wheezing or shortness of breath. 10/30/20  Yes Collene Gobble, MD  arformoterol (BROVANA) 15 MCG/2ML NEBU Take 2 mLs (15 mcg total) by nebulization 2 (two) times  daily. Patient taking differently: Take 15 mcg by nebulization 2 (two) times daily. Mix with budesonide 08/30/19  Yes Byrum, Rose Fillers, MD  azaTHIOprine (IMURAN) 50 MG tablet Take 125 mg by mouth daily.    Yes [provider]  B-D ULTRAFINE III SHORT PEN 31G X 8 MM MISC See admin instructions. 11/06/19  Yes [provider]  Blood Glucose Monitoring Suppl (ONE TOUCH ULTRA 2) w/Device KIT 2 (two) times daily. 04/08/20  Yes [provider]  budesonide (PULMICORT) 0.5 MG/2ML nebulizer solution Take 2 mLs (0.5 mg total) by nebulization 2 (two) times daily. Patient taking differently: Take 2 mLs by nebulization 2 (two) times daily. Mix with Brovana 08/24/19  Yes [name], MD  calcitRIOL (ROCALTROL) 0.25 MCG capsule Take 0.25 mcg by mouth every 3 (three) days.    Yes [provider]  clotrimazole (MYCELEX) 10 MG troche Take 1 tablet (10 mg total) by mouth 5 (five) times daily. Patient taking differently: Take 10 mg by mouth 5 (five) times daily as needed (thrush). 11/28/18  Yes Parrett, Tammy S, NP  cyclobenzaprine (FLEXERIL) 10 MG tablet Take 1-2 tablets (10-20 mg total) by mouth See admin instructions. Take two tablets (20 mg) by mouth daily at bedtime, may also take one tablet (10 mg) at noon as needed for muscle spasms 08/19/20  Yes Meredith Staggers, MD  diltiazem (CARDIZEM CD) 120 MG 24 hr capsule TAKE 1 CAPSULE BY MOUTH EVERY DAY Patient taking differently: Take 120 mg by mouth daily. 12/07/19  Yes [name], MD  diltiazem (CARDIZEM) 30 MG tablet TAKE 1 TABLET EVERY 4 HOURS AS NEEDED FOR AFIB HEART RATE >100 Patient taking differently: Take 30 mg by mouth every 4 (four) hours as needed. afib heart rate >100 12/27/20  Yes Sherran Needs, NP  dofetilide (TIKOSYN) 250 MCG capsule Take 1 capsule (250 mcg total) by mouth 2 (two) times daily. 08/13/20  Yes [name], MD  DULoxetine (CYMBALTA) 30 MG capsule TAKE 2 CAPSULES (60 MG TOTAL) BY MOUTH EVERY  MORNING. Patient taking differently: Take 60 mg by mouth daily. 10/30/20  Yes Thomas, Eunice L, NP  ELIQUIS 5 MG TABS tablet TAKE 1 TABLET BY MOUTH TWICE A DAY (TO REPLACE WARFARIN) Patient taking differently: Take 5 mg by mouth 2 (two) times daily. 10/02/20  Yes [name], MD  fluticasone (FLONASE) 50 MCG/ACT nasal spray PLACE 2 SPRAYS INTO BOTH NOSTRILS 2 TIMES DAILY Patient taking differently: Place 2 sprays into both nostrils in the morning and at bedtime. 12/17/20  Yes Collene Gobble, MD  furosemide (LASIX) 40 MG tablet Take 1 tablet (40 mg total) by mouth daily. 07/14/20  Yes Dwyane Dee, MD  glimepiride (AMARYL) 4 MG tablet Take 4 mg by mouth daily. 02/03/20  Yes [provider]  insulin aspart (NOVOLOG FLEXPEN) 100 UNIT/ML FlexPen Inject 14 Units into the skin 3 (three) times daily with meals.   Yes [provider]  Insulin Glargine (BASAGLAR KWIKPEN) 100 UNIT/ML SOPN Inject 12 Units into the skin daily before breakfast.    Yes [provider]  lamoTRIgine (LAMICTAL) 25 MG tablet TAKE 1 TABLET BY MOUTH EVERYDAY AT BEDTIME Patient taking differently: Take 25 mg by mouth at bedtime. 11/01/20  Yes Meredith Staggers, MD  Lancets [facility] DELICA PLUS [id number]) MISC daily. as directed 03/26/20  Yes [provider]  LORazepam (ATIVAN) 1 MG  tablet Take 1 tablet (1 mg total) by mouth 2 (two) times daily. 09/12/20  Yes [name], MD  metFORMIN (GLUCOPHAGE) 500 MG tablet Take 500 mg by mouth 2 (two) times daily. 10/03/16  Yes [provider]  metoprolol tartrate (LOPRESSOR) 50 MG tablet Take 1 tablet (50 mg total) by mouth 2 (two) times daily. Appointment Required For Further Refills [phone] 01/22/21  Yes [name], MD  montelukast (SINGULAIR) 10 MG tablet Take 1 tablet (10 mg total) by mouth at bedtime. 10/30/20  Yes Collene Gobble, MD  nitroGLYCERIN (NITROSTAT) 0.4 MG SL tablet Place 1 tablet (0.4 mg total) under the tongue every 5  (five) minutes as needed for chest pain. 09/11/13  Yes [name], MD  omeprazole (PRILOSEC) 20 MG capsule Take 20 mg by mouth daily.   Yes [provider]  ONETOUCH ULTRA test strip USE AS DIRECTED 3 TIMES DAILY TO CHECK BLOOD SUGAR 11/04/19  Yes [provider]  Oxycodone HCl 10 MG TABS Take 1 tablet (10 mg total) by mouth 3 (three) times daily as needed. Patient taking differently: Take 10 mg by mouth 3 (three) times daily as needed (pain). 01/24/21  Yes Meredith Staggers, MD  OXYGEN Inhale 3 L into the lungs continuous.   Yes [provider]  predniSONE (DELTASONE) 5 MG tablet Take 5 mg by mouth daily. 06/19/19  Yes [provider]  promethazine (PHENERGAN) 25 MG tablet Take 1 tablet (25 mg total) by mouth at bedtime as needed for nausea. For nausea and sleep 07/14/20  Yes Dwyane Dee, MD  rosuvastatin (CRESTOR) 20 MG tablet TAKE 1 TABLET BY MOUTH EVERY DAY Patient taking differently: Take 20 mg by mouth daily. 10/30/20  Yes [name], MD  sertraline (ZOLOFT) 100 MG tablet Take 100 mg by mouth daily. 06/12/20  Yes [provider]  albuterol (PROVENTIL) (2.5 MG/3ML) 0.083% nebulizer solution Take 3 mLs (2.5 mg total) by nebulization every 6 (six) hours as needed for wheezing or shortness of breath (dx: J45.20). Patient not taking: No sig reported 03/15/19   Parrett, Fonnie Mu, NP  doxycycline (VIBRA-TABS) 100 MG tablet Take 1 tablet (100 mg total) by mouth 2 (two) times daily. Patient not taking: Reported on 01/29/2021 11/11/20   Collene Gobble, MD    Inpatient Medications: Scheduled Meds:  apixaban  5 mg Oral BID   arformoterol  15 mcg Nebulization BID   azaTHIOprine  125 mg Oral Daily   budesonide  2 mL Nebulization BID   [START ON 01/31/2021] calcitRIOL  0.25 mcg Oral Q3 days   cyclobenzaprine  20 mg Oral QHS   diltiazem  120 mg Oral Daily   dofetilide  250 mcg Oral BID   DULoxetine  60 mg Oral Daily   fluticasone  2 spray Each Nare  Daily   furosemide  40 mg Intravenous Q12H   insulin aspart  0-9 Units Subcutaneous TID WC   insulin glargine  12 Units Subcutaneous Daily   lamoTRIgine  25 mg Oral QHS   LORazepam  1 mg Oral BID   metoprolol tartrate  50 mg Oral BID   montelukast  10 mg Oral QHS   pantoprazole  40 mg Oral Daily   potassium chloride  40 mEq Oral Q4H   predniSONE  5 mg Oral Daily   rosuvastatin  20 mg Oral Daily   sertraline  100 mg Oral Daily   Continuous Infusions:  PRN Meds: acetaminophen **OR** acetaminophen, albuterol, cyclobenzaprine, nitroGLYCERIN,  oxyCODONE, promethazine  Allergies:    Allergies  Allergen Reactions   Tetracycline Hives    Patient tolerated Doxycycline Dec 2020   Niacin Other (See Comments)    Mouth blisters   Niaspan [Niacin Er] Other (See Comments)    Mouth blisters   Sulfa Antibiotics Nausea Only and Other (See Comments)    "Tears up stomach"   Sulfonamide Derivatives Other (See Comments)    Reaction: per patient "tears her stomach up"   Codeine Nausea And Vomiting   Erythromycin Nausea And Vomiting   Hydromorphone Hcl Nausea And Vomiting   Morphine And Related Nausea And Vomiting   Nalbuphine Nausea And Vomiting    Nubain   Sulfasalazine Nausea Only and Other (See Comments)    per patient "tears her stomach up", "Tears up stomach"   Tape Rash and Other (See Comments)    No "plastic" tape," please----cloth tape only    Social History:   Social History   Socioeconomic History   Marital status: Widowed    Spouse name: Not on file   Number of children: Not on file   Years of education: Not on file   Highest education level: Not on file  Occupational History   Not on file  Tobacco Use   Smoking status: Former    Packs/day: 1.00    Years: 30.00    Pack years: 30.00    Types: Cigarettes    Quit date: 08/17/2002    Years since quitting: 18.4   Smokeless tobacco: Never  Vaping Use   Vaping Use: Never used  Substance and Sexual Activity   Alcohol use:  No   Drug use: No   Sexual activity: Not on file  Other Topics Concern   Not on file  Social History Narrative   She is currently married, and the caregiver of her husband who is recovering from surgery for tongue cancer now diagnosed with lung cancer. Prior to his diagnosis of her husband, she actually had adopted a 51-year-old child who she knows caring for as well. With all the surgeries, they have been quite financially troubled. Thanks the help of her community and church, they have been able to stay "alfoat."     She is a former smoker who quit in 2004 after a 30-pack-year history.   She is active chasing a 27-year-old child, does not do routine exercise. She's been quite depressed with the condition of her husband, and admits to eating comfort herself.   She does not drink alcohol.   Social Determinants of Health   Financial Resource Strain: Not on file  Food Insecurity: Not on file  Transportation Needs: Not on file  Physical Activity: Not on file  Stress: Not on file  Social Connections: Not on file  Intimate Partner Violence: Not on file    Family History:    Family History  Problem Relation Age of Onset   Cancer Mother        liver   Heart disease Father    Cancer Father        colon   Arrhythmia Brother        Atrial Fibrillation   Arrhythmia Paternal Aunt        Atrial Fibrillation     ROS:  Please see the history of present illness.  General:no colds or fevers, believes her wt is up Skin:no rashes or ulcers HEENT:no blurred vision, no congestion CV:see HPI PUL:see HPI GI:no diarrhea constipation or melena, no indigestion GU:no hematuria, no dysuria  MS:no joint pain, no claudication Neuro:no syncope, occ lightheadedness Endo:+ diabetes, no thyroid disease  All other ROS reviewed and negative.     Physical Exam/Data:   Vitals:   01/29/21 0447 01/29/21 0734 01/29/21 0756 01/29/21 1214  BP: (!) 164/85 (!) 147/74  140/63  Pulse: 91 96  76  Resp: _0 Temp: 99.3 F (37.4 C) 98.5 F (36.9 C)  98.3 F (36.8 C)  TempSrc: Oral Oral  Oral  SpO2: 97% 95% 95% 94%  Weight:      Height:        Intake/Output Summary (Last 24 hours) at 01/29/2021 1531 Last data filed at 01/29/2021 1414 Gross per 24 hour  Intake 240 ml  Output 2800 ml  Net -2560 ml   Last 3 Weights 01/29/2021 12/25/2020 10/30/2020  Weight (lbs) 215 lb 210 lb 233 lb 0.4 oz  Weight (kg) 97.523 kg 95.255 kg 105.7 kg     Body mass index is 40.62 kg/m.  General:  Well nourished, well developed, in no acute distress though has to stop and think about answer she feels she is jumping from one thing to another and was to see Neuro HEENT: normal Lymph: no adenopathy Neck: mild JVD Endocrine:  No thryomegaly Vascular: No carotid bruits; pedal pulses 1+ bilaterally  Cardiac:  normal S1, S2; RRR; no murmur gallup or rub Lungs:  diminished  to auscultation bilaterally, occ wheezing, rhonchi or rales  Abd: soft, nontender, no hepatomegaly  Ext: no edema- currently Musculoskeletal:  No deformities, BUE and BLE strength normal and equal Skin: warm and dry  Neuro:  alert and oriented X 3 MAE follows commands, no focal abnormalities noted Psych:  Normal affect    Relevant CV Studies: 04/21/2018 Diagnostic Dominance: Right    Intervention LV end diastolic pressure is normal. _________________________________________________ Colon Flattery LAD to Prox LAD lesion is 50% stenosed. Prox LAD-1 lesion is 100% stenosed prior to D1. Prox LAD-2 lesion is 100% stenosed after D1. SVG-D1 graft was visualized by angiography and is large. Dist Graft to Insertion STENT is 20% stenosed. The Native Cx is widely patent with a small 1st Marg & 2 Small distal LPL branches. Ost 1st Mrg lesion is 90% stenosed. Small Caliber Vessel - not good PCI/PTCA target Ost RCA to Dist RCA lesion is 100% stenosed. - Unable to engage. (known occlusion) SVG-dRCA graft was visualized by angiography and is large. The graft  exhibits minimal luminal irregularities. LIMA graft was not injected. -Due to inability to access the right common femoral artery and desire to avoid the left femoral access because of transplanted kidney, right radial access was chosen (she has a left arm fistula) _________________________________________________ Severe right common femoral artery disease with focal subtotal occlusion.   Severe native coronary disease with occluded RCA and proximal LAD.  Severe, but stable ostial OM1 disease Patent SVG-D1 with stent in place. Patent SVG-dRCA Due to access issues, unable to visualize LIMA graft. Severe focal calcified lesion in the right common femoral artery: Unable to obtain femoral arterial access despite using ultrasound guidance -unable to advance wire.   No obvious lesions found for PCI.  Unfortunately still unable to evaluate the LIMA graft, however this is unlikely to have an issue as it was widely patent and previous cath. May need to consider noninvasive evaluation versus left femoral access if necessary to visualize LIMA.   Patient will be transferred to [facility] postprocedural unit for Rad Stat removal.   Restart IV heparin 8 hours after  sheath removal.   Continue treatment of A. fib RVR, I suspect that her anginal symptoms were related to A. fib RVR with existing CAD and supply versus demand ischemia.     Recommend Aspirin 61m daily for moderate CAD.     ECHO 06/2020  IMPRESSIONS    1. There is no left ventricular thrombus with Definity contrast. 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 Left ventricular ejection fraction, by  estimation, is 40 to 45%. The left ventricle has mildly decreased  function. The left ventricle demonstrates  regional wall motion abnormalities (see scoring diagram/findings for  description). There is moderate concentric left ventricular hypertrophy.  Left ventricular diastolic parameters are consistent with Grade II  diastolic dysfunction  (pseudonormalization).  Elevated left atrial pressure. There is moderate hypokinesis of the left  ventricular, mid-apical anterior wall and anteroseptal wall. There is mild  dyskinesis of the left ventricular, entire apical segment.   2. Right ventricular systolic function is normal. The right ventricular  size is normal. Tricuspid regurgitation signal is inadequate for assessing  PA pressure.   3. Left atrial size was moderately dilated.   4. The mitral valve is normal in structure. Mild mitral valve  regurgitation. Moderate mitral annular calcification.   5. The aortic valve is bicuspid. Aortic valve regurgitation is not  visualized. Mild aortic valve sclerosis is present, with no evidence of  aortic valve stenosis.   6. The inferior vena cava is normal in size with greater than 50%  respiratory variability, suggesting right atrial pressure of 3 mmHg.   Comparison(s): Prior images reviewed side by side. Similar left  ventricular wall motion abnormalities and EF were probably present on the  previous study, but not well identified without the use of Definity  contrast.   FINDINGS   Left Ventricle: There is no left ventricular thrombus with Definity  contrast. 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 Left ventricular ejection  fraction, by estimation, is 40 to 45%. The left ventricle has mildly  decreased function. The left ventricle  demonstrates regional wall motion abnormalities. Moderate hypokinesis of  the left ventricular, mid-apical anterior wall and anteroseptal wall. Mild  dyskinesis of the left ventricular, entire apical segment. Definity  contrast agent was given IV to  delineate the left ventricular endocardial borders. The left ventricular  internal cavity size was normal in size. There is moderate concentric left  ventricular hypertrophy. Left ventricular diastolic parameters are  consistent with Grade II diastolic  dysfunction (pseudonormalization). Elevated left atrial  pressure.      Laboratory Data:  High Sensitivity Troponin:   Recent Labs  Lab 01/29/21 0030 01/29/21 0230 01/29/21 0625 01/29/21 0746  TROPONINIHS 91* 241* 326* 305*     Chemistry Recent Labs  Lab 01/29/21 0030 01/29/21 0152 01/29/21 0625  NA 135 135 134*  K 4.2 4.0 3.6  CL 98  --  96*  CO2 25  --  26  GLUCOSE 268*  --  419*  BUN 14  --  14  CREATININE 0.90  --  0.97  CALCIUM 9.6  --  9.2  GFRNONAA >60  --  >60  ANIONGAP 12  --  12    Recent Labs  Lab 01/29/21 0030 01/29/21 0625  PROT 7.2 6.9  ALBUMIN 3.4* 3.3*  AST 18 17  ALT 14 12  ALKPHOS 99 96  BILITOT 0.5 0.9   Hematology Recent Labs  Lab 01/29/21 0030 01/29/21 0152 01/29/21 0625  WBC 8.1  --  7.4  RBC 3.88  --  3.71*  HGB 11.8* 11.9* 11.3*  HCT 37.5 35.0* 34.9*  MCV 96.6  --  94.1  MCH 30.4  --  30.5  MCHC 31.5  --  32.4  RDW 15.3  --  15.3  PLT 222  --  221   BNP Recent Labs  Lab 01/29/21 0030  BNP 352.2*    DDimer No results for input(s): DDIMER in the last 168 hours.   Radiology/Studies:  CT Head Wo Contrast  Result Date: 01/29/2021 CLINICAL DATA:  Delirium and head trauma. EXAM: CT HEAD WITHOUT CONTRAST TECHNIQUE: Contiguous axial images were obtained from the base of the skull through the vertex without intravenous contrast. COMPARISON:  04/06/2017 FINDINGS: Brain: There is no mass, hemorrhage or extra-axial collection. The size and configuration of the ventricles and extra-axial CSF spaces are normal. There is hypoattenuation of the white matter, most commonly indicating chronic small vessel disease. Vascular: No abnormal hyperdensity of the major intracranial arteries or dural venous sinuses. No intracranial atherosclerosis. Skull: The visualized skull base, calvarium and extracranial soft tissues are normal. Sinuses/Orbits: No fluid levels or advanced mucosal thickening of the visualized paranasal sinuses. No mastoid or middle ear effusion. The orbits are normal. IMPRESSION: Chronic  small vessel disease without acute intracranial abnormality. Electronically Signed   By: Ulyses Jarred M.D.   On: 01/29/2021 02:03   DG Chest Portable 1 View  Result Date: 01/29/2021 CLINICAL DATA:  Shortness of breath EXAM: PORTABLE CHEST 1 VIEW COMPARISON:  None. FINDINGS: Mild cardiomegaly. Remote median sternotomy. Unchanged mild interstitial opacity. The visualized skeletal structures are unremarkable. IMPRESSION: Cardiomegaly and mild interstitial pulmonary edema. Electronically Signed   By: Ulyses Jarred M.D.   On: 01/29/2021 00:55     Assessment and Plan:   Acute on chronic combined systolic and diastolic CHF. Given lasix 40 mg this AM neg 2560 since admit. (Home lasix dose 40 mg daily) echo 5/21 with normal EF and G3 DD and repeat in Nov 2021 with drop in EF to 40-45% and G2 DD and small apical aneurysm There is moderate hypokinesis of the left ventricular, mid-apical anterior wall and anteroseptal wall. There is mild dyskinesis of the left ventricular, entire apical segment, mild MR no AS.  With decrease in EF may need to stop dilt. Will recheck echo NSTEMI/Angina/elevated troponin pk 326 and significant CAD now EKG with new LBBB on tikosyn dilt and lopressor with extra doses an extra 50 mg daily  will plan for cardiac cath - plan for Friday with last dose Eliquis this am. CAD with MI in 1994, CABG 1995 and 5 PCI procedures to VG to Diag last one in 2015.  VG to dRCA patent in 2019 and Annitta [place] has been patent in past but unable to visualize on cath 2019. On BB, dilt statin no longer on ASA or plavix due to eliquis PAF has been maintaining SR to SB on tikosyn -  Anticoagulation on eliquis no bleeding and with recent fall no abnormality on brain CT DM per iM Hx of renal transplant with stable Cr. On immunosuppressant and steroids  Prolonged Qtc with new LBBB on tikosyn.  Will check Mg+ as well. With numerous issues may need EP to weigh in on tikosyn COPD/Asthma on home 02 and nebs Chronic pain  on oxycodone. HLD continue Crestor    Risk Assessment/Risk Scores:     TIMI Risk Score for Unstable Angina or Non-ST Elevation MI:   The patient's TIMI risk score is 5, which indicates a 26% risk of all  cause mortality, new or recurrent myocardial infarction or need for urgent revascularization in the next 14 days.  New York Heart Association (NYHA) Functional Class NYHA Class III        For questions or updates, please contact [facility] Please consult [email or web address] for contact info under    Signed, Cecilie Kicks, NP  01/29/2021 3:31 PM  Agree with note written by Cecilie Kicks RNP  Pt well known to me from past interventions. Admitted with CHF and CP. Enz +. H/O PF in NSR on Ticosyn. New LBBB. On DOAC. Trop + H/O remote CABG with SVG-->Diag stenting in past. Exam benign. Plan Cor angio on Friday. Will hold DOAC, check 2D.     Quay Burow 01/29/2021 7:31 PM

## 2021-01-30 ENCOUNTER — Inpatient Hospital Stay (HOSPITAL_COMMUNITY): Payer: Medicare Other

## 2021-01-30 ENCOUNTER — Other Ambulatory Visit: Payer: Self-pay | Admitting: Physical Medicine & Rehabilitation

## 2021-01-30 DIAGNOSIS — E1142 Type 2 diabetes mellitus with diabetic polyneuropathy: Secondary | ICD-10-CM

## 2021-01-30 DIAGNOSIS — I5021 Acute systolic (congestive) heart failure: Secondary | ICD-10-CM

## 2021-01-30 DIAGNOSIS — M47816 Spondylosis without myelopathy or radiculopathy, lumbar region: Secondary | ICD-10-CM

## 2021-01-30 HISTORY — PX: TRANSTHORACIC ECHOCARDIOGRAM: SHX275

## 2021-01-30 LAB — CBC
HCT: 33 % — ABNORMAL LOW (ref 36.0–46.0)
Hemoglobin: 10.7 g/dL — ABNORMAL LOW (ref 12.0–15.0)
MCH: 30.6 pg (ref 26.0–34.0)
MCHC: 32.4 g/dL (ref 30.0–36.0)
MCV: 94.3 fL (ref 80.0–100.0)
Platelets: 235 10*3/uL (ref 150–400)
RBC: 3.5 MIL/uL — ABNORMAL LOW (ref 3.87–5.11)
RDW: 15.1 % (ref 11.5–15.5)
WBC: 11.5 10*3/uL — ABNORMAL HIGH (ref 4.0–10.5)
nRBC: 0 % (ref 0.0–0.2)

## 2021-01-30 LAB — HEPARIN LEVEL (UNFRACTIONATED): Heparin Unfractionated: >1.1 IU/mL — ABNORMAL HIGH (ref 0.30–0.70)

## 2021-01-30 LAB — APTT
aPTT: 43 seconds — ABNORMAL HIGH (ref 24–36)
aPTT: 49 seconds — ABNORMAL HIGH (ref 24–36)

## 2021-01-30 LAB — BASIC METABOLIC PANEL
Anion gap: 10 (ref 5–15)
BUN: 17 mg/dL (ref 8–23)
CO2: 28 mmol/L (ref 22–32)
Calcium: 9.2 mg/dL (ref 8.9–10.3)
Chloride: 97 mmol/L — ABNORMAL LOW (ref 98–111)
Creatinine, Ser: 0.98 mg/dL (ref 0.44–1.00)
GFR, Estimated: >60 mL/min (ref >60)
Glucose, Bld: 396 mg/dL — ABNORMAL HIGH (ref 70–99)
Potassium: 4.9 mmol/L (ref 3.5–5.1)
Sodium: 135 mmol/L (ref 135–145)

## 2021-01-30 LAB — GLUCOSE, CAPILLARY
Glucose-Capillary: 356 mg/dL — ABNORMAL HIGH (ref 70–99)
Glucose-Capillary: 465 mg/dL — ABNORMAL HIGH (ref 70–99)
Glucose-Capillary: 369 mg/dL — ABNORMAL HIGH (ref 70–99)
Glucose-Capillary: 79 mg/dL (ref 70–99)

## 2021-01-30 MED ORDER — METOPROLOL TARTRATE 100 MG PO TABS
100.0000 mg | ORAL_TABLET | Freq: Two times a day (BID) | ORAL | Status: DC
  Administered 2021-01-30 – 2021-02-03 (×8): 100 mg via ORAL
  Filled 2021-01-30 (×8): qty 1

## 2021-01-30 MED ORDER — INSULIN ASPART 100 UNIT/ML IJ SOLN: 0.0000 [IU] | Freq: Every day | INTRAMUSCULAR | Status: DC

## 2021-01-30 MED ORDER — INSULIN GLARGINE 100 UNIT/ML ~~LOC~~ SOLN: 20.0000 [IU] | Freq: Every day | SUBCUTANEOUS | Status: DC

## 2021-01-30 MED ORDER — INSULIN ASPART 100 UNIT/ML IJ SOLN
6.0000 [IU] | Freq: Three times a day (TID) | INTRAMUSCULAR | Status: DC
  Administered 2021-01-30 – 2021-02-03 (×9): 6 [IU] via SUBCUTANEOUS

## 2021-01-30 MED ORDER — POTASSIUM CHLORIDE CRYS ER 20 MEQ PO TBCR
20.0000 meq | EXTENDED_RELEASE_TABLET | Freq: Every day | ORAL | Status: AC
  Administered 2021-01-30 – 2021-01-31 (×3): 20 meq via ORAL
  Filled 2021-01-30: qty 1

## 2021-01-30 MED ORDER — INSULIN ASPART 100 UNIT/ML IJ SOLN
0.0000 [IU] | Freq: Three times a day (TID) | INTRAMUSCULAR | Status: DC
  Administered 2021-01-30: 20 [IU] via SUBCUTANEOUS
  Administered 2021-01-30: 9 [IU] via SUBCUTANEOUS
  Administered 2021-01-30: 20 [IU] via SUBCUTANEOUS
  Administered 2021-01-31: 7 [IU] via SUBCUTANEOUS
  Administered 2021-01-31: 15 [IU] via SUBCUTANEOUS
  Administered 2021-02-01 – 2021-02-02 (×4): 7 [IU] via SUBCUTANEOUS
  Administered 2021-02-02: 4 [IU] via SUBCUTANEOUS
  Administered 2021-02-02: 20 [IU] via SUBCUTANEOUS
  Administered 2021-02-03: 4 [IU] via SUBCUTANEOUS

## 2021-01-30 MED ORDER — INSULIN GLARGINE 100 UNIT/ML ~~LOC~~ SOLN
12.0000 [IU] | Freq: Every day | SUBCUTANEOUS | Status: DC
  Administered 2021-01-31: 12 [IU] via SUBCUTANEOUS
  Filled 2021-01-30 (×2): qty 0.12

## 2021-01-30 MED ORDER — SODIUM CHLORIDE 0.9 % WEIGHT BASED INFUSION
3.0000 mL/kg/h | INTRAVENOUS | Status: DC
  Administered 2021-01-31: 3 mL/kg/h via INTRAVENOUS

## 2021-01-30 MED ORDER — ASPIRIN 81 MG PO CHEW
81.0000 mg | CHEWABLE_TABLET | ORAL | Status: AC
  Administered 2021-01-31: 81 mg via ORAL
  Filled 2021-01-30: qty 1

## 2021-01-30 MED ORDER — SODIUM CHLORIDE 0.9 % WEIGHT BASED INFUSION
1.0000 mL/kg/h | INTRAVENOUS | Status: DC
  Administered 2021-01-31: 1 mL/kg/h via INTRAVENOUS

## 2021-01-30 MED ORDER — SODIUM CHLORIDE 0.9 % IV SOLN: 250.0000 mL | INTRAVENOUS | Status: DC | PRN

## 2021-01-30 MED ORDER — PERFLUTREN LIPID MICROSPHERE
1.0000 mL | INTRAVENOUS | Status: AC | PRN
  Administered 2021-01-30: 3 mL via INTRAVENOUS
  Filled 2021-01-30: qty 10

## 2021-01-30 MED ORDER — MAGNESIUM SULFATE 2 GM/50ML IV SOLN
2.0000 g | Freq: Once | INTRAVENOUS | Status: AC
  Administered 2021-01-30: 2 g via INTRAVENOUS
  Filled 2021-01-30: qty 50

## 2021-01-30 MED ORDER — SODIUM CHLORIDE 0.9% FLUSH: 3.0000 mL | INTRAVENOUS | Status: DC | PRN

## 2021-01-30 NOTE — Plan of Care (Signed)
  Problem: Clinical Measurements: Goal: Ability to maintain clinical measurements within normal limits will improve Outcome: Progressing   Problem: Clinical Measurements: Goal: Will remain free from infection Outcome: Progressing   

## 2021-01-30 NOTE — Progress Notes (Signed)
QTc running at 522. Paged [place] in regards to give patient's scheduled tikosyn dose. Awaiting further orders and will continue to monitor pt.

## 2021-01-30 NOTE — Progress Notes (Addendum)
[place] for IV Heparin Indication: chest pain/ACS and atrial fibrillation  Allergies  Allergen Reactions   Tetracycline Hives    Patient tolerated Doxycycline Dec 2020   Niacin Other (See Comments)    Mouth blisters   Niaspan [Niacin Er] Other (See Comments)    Mouth blisters   Sulfa Antibiotics Nausea Only and Other (See Comments)    "Tears up stomach"   Sulfonamide Derivatives Other (See Comments)    Reaction: per patient "tears her stomach up"   Codeine Nausea And Vomiting   Erythromycin Nausea And Vomiting   Hydromorphone Hcl Nausea And Vomiting   Morphine And Related Nausea And Vomiting   Nalbuphine Nausea And Vomiting    Nubain   Sulfasalazine Nausea Only and Other (See Comments)    per patient "tears her stomach up", "Tears up stomach"   Tape Rash and Other (See Comments)    No "plastic" tape," please----cloth tape only    Patient Measurements: Height: 5\' 1"  (154.9 cm) Weight: 105.8 kg (233 lb 3.2 oz) IBW/kg (Calculated) : 47.8 Heparin Dosing Weight:  71.1 kg  Vital Signs: Temp: 97.8 F (36.6 C) (06/16 1055) Temp Source: Oral (06/16 1055) BP: 142/61 (06/16 1055) Pulse Rate: 78 (06/16 1055)  Labs: Recent Labs    01/29/21 0030 01/29/21 0152 01/29/21 0230 01/29/21 0625 01/29/21 0746 01/30/21 0332 01/30/21 1644  HGB 11.8* 11.9*  --  11.3*  --  10.7*  --   HCT 37.5 35.0*  --  34.9*  --  33.0*  --   PLT 222  --   --  221  --  235  --   APTT  --   --   --   --   --  43* 49*  HEPARINUNFRC  --   --   --   --   --  >1.10*  --   CREATININE 0.90  --   --  0.97  --  0.98  --   TROPONINIHS 91*  --  241* 326* 305*  --   --      Estimated Creatinine Clearance: 65.9 mL/min (by C-G formula based on SCr of 0.98 mg/dL).   Medical History: Past Medical History:  Diagnosis Date   Anemia    Anxiety    Bilateral carotid artery stenosis    Carotid duplex 04/3266: 1-24% LICA, 58-09% RICA, >98% RECA, f/u 1 yr suggested   CAD  (coronary artery disease) of bypass graft 5/01; 3/'02, 8/'03, 10/'04; 1/15   PCI x 5 to SVG-D1    CAD in native artery 07/1993   3 Vessel Disease (LAD-D1 & RCA) -- CABG (Dx in setting of inferior STEMI-PTCA of RCA)   CAD S/P percutaneous coronary angioplasty    PCI to SVG-D1 insertion/native D1 x 4 = '01 -(S660 BMS 2.5 x 9 anastomosis- D1); '02 - distal overlap ACS Pixel 2.5 x 8  BMS; '03 distal/native ISR/Thrombosis - Pixel 2.5 x 13; '04 - ISR-  Taxus 2.5 x 20 (covered all);; 1/15 - mid SVG-D1 (50% distal ISR) - Promus P 2.75 x 20 -- 2.8 mm   COPD mixed type (HCC)    Followed by Dr. Lamonte Sakai "pulmonologist said no COPD"   Depression    Depression with anxiety    Diabetes mellitus type 2 in obese ([place])    Diarrhea    started after cholecystectomy and mass removed from intestine   Dyslipidemia, goal LDL below 70    08/2012: TC 137, TG 200, HDL 32!,  LDL 45; on statin (followed by Dr.Deterding)   ESRD (end stage renal disease) ([place]) 1991   s/p Cadaveric Renal Transplant [facility] - Dr. Jimmy Footman)    Family history of adverse reaction to anesthesia    mom's bp dropped during/after anesthesia   Fibromyalgia    GERD (gastroesophageal reflux disease)    Glomerulonephritis, chronic, rapidly progressive 1989   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 07/1993   Rescue PTCA of RCA -- referred for CABG.   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 Servando Snare: LIMA-LAD, SVG-bifurcatingD1, SVG-rPDA   History of kidney stones    History of stroke 2012   "right eye stroke- half blind now"   Hypertension associated with diabetes ([place])    Mild aortic stenosis by prior echocardiogram 07/2019   Echo:  Mild aortic stenosis (gradients: Mean 14.3 mmHg -peak 24.9 mmHg).   Morbid obesity (HCC)    MRSA (methicillin resistant staph aureus) culture positive    OSA (obstructive sleep apnea)    no longer on CPAP or home O2, states she doesn't  need now after lap band   PAD (peripheral artery disease) ([place]) 08/2013   LEA Dopplers to be read by Dr. Fletcher Anon   PAF (paroxysmal atrial fibrillation) ([place]) 06/2014   Noted on CardioNet Monitor  - --> rhythm control with Tikosyn (Dr. Rayann Heman); converted from warfarin to apixaban for anticoagulation.   Pneumonia    Recurrent boils    Bilateral Groin   Rheumatoid arthritis ([place])    Per Patient Report; associated with OA   S/p cadaver renal transplant 1991   [facility]   Unstable angina ([place]) 5/01; 3/'02, 8/'03, 10/'04; 1/15   x 5 occurences since Inf-Post STEMI in 1994    Assessment: 65 yr old woman admitted on 01/28/21 with acute on chronic combined systolic and diastolic CHF, NSTEMI/angina/elevated troponins, CAD, PAF (on apixaban, last dose at 0902 AM today), hx of renal transplant, COPD. Pharmacy is consulted to dose IV heparin for ACS, atrial fibrillation. Given recent apixaban exposure, will monitor anticoagulation using aPTT until aPTT and heparin levels correlate.  Heparin level falsely elevated from recent Eliquis.  aPTT 49 after increased heparin to 1000 units/hr. Confirmed with nurse no bleeding was present and no issues with IV access.   Goal of Therapy:  Heparin level 0.3-0.7 units/ml aPTT 66-102 sec Monitor platelets by anticoagulation protocol: Yes   Plan:  Increase IV heparin to 1250 units/hr. Recheck aPTT 6 hrs after rate increase. Daily heparin level and CBC. F/u plans for cath today.  Owens Loffler, PharmD Candidate  01/30/2021 6:07 PM   [facility] pharmacy phone numbers are listed on [email or web address]

## 2021-01-30 NOTE — Progress Notes (Signed)
MD, pt has a lap-band still, thanks Buckner [place].

## 2021-01-30 NOTE — Progress Notes (Addendum)
[place] for IV Heparin Indication: chest pain/ACS and atrial fibrillation  Allergies  Allergen Reactions   Tetracycline Hives    Patient tolerated Doxycycline Dec 2020   Niacin Other (See Comments)    Mouth blisters   Niaspan [Niacin Er] Other (See Comments)    Mouth blisters   Sulfa Antibiotics Nausea Only and Other (See Comments)    "Tears up stomach"   Sulfonamide Derivatives Other (See Comments)    Reaction: per patient "tears her stomach up"   Codeine Nausea And Vomiting   Erythromycin Nausea And Vomiting   Hydromorphone Hcl Nausea And Vomiting   Morphine And Related Nausea And Vomiting   Nalbuphine Nausea And Vomiting    Nubain   Sulfasalazine Nausea Only and Other (See Comments)    per patient "tears her stomach up", "Tears up stomach"   Tape Rash and Other (See Comments)    No "plastic" tape," please----cloth tape only    Patient Measurements: Height: 5\' 1"  (154.9 cm) Weight: 105.8 kg (233 lb 3.2 oz) IBW/kg (Calculated) : 47.8 Heparin Dosing Weight:  71.1 kg  Vital Signs: Temp: 98.3 F (36.8 C) (06/16 0357) Temp Source: Oral (06/16 0357) BP: 156/90 (06/16 0400) Pulse Rate: 71 (06/16 0357)  Labs: Recent Labs    01/29/21 0030 01/29/21 0152 01/29/21 0230 01/29/21 0625 01/29/21 0746 01/30/21 0332  HGB 11.8* 11.9*  --  11.3*  --  10.7*  HCT 37.5 35.0*  --  34.9*  --  33.0*  PLT 222  --   --  221  --  235  APTT  --   --   --   --   --  43*  HEPARINUNFRC  --   --   --   --   --  >1.10*  CREATININE 0.90  --   --  0.97  --  0.98  TROPONINIHS 91*  --  241* 326* 305*  --      Estimated Creatinine Clearance: 65.9 mL/min (by C-G formula based on SCr of 0.98 mg/dL).   Medical History: Past Medical History:  Diagnosis Date   Anemia    Anxiety    Bilateral carotid artery stenosis    Carotid duplex 04/5283: 1-32% LICA, 44-01% RICA, >02% RECA, f/u 1 yr suggested   CAD (coronary artery disease) of bypass graft 5/01;  3/'02, 8/'03, 10/'04; 1/15   PCI x 5 to SVG-D1    CAD in native artery 07/1993   3 Vessel Disease (LAD-D1 & RCA) -- CABG (Dx in setting of inferior STEMI-PTCA of RCA)   CAD S/P percutaneous coronary angioplasty    PCI to SVG-D1 insertion/native D1 x 4 = '01 -(S660 BMS 2.5 x 9 anastomosis- D1); '02 - distal overlap ACS Pixel 2.5 x 8  BMS; '03 distal/native ISR/Thrombosis - Pixel 2.5 x 13; '04 - ISR-  Taxus 2.5 x 20 (covered all);; 1/15 - mid SVG-D1 (50% distal ISR) - Promus P 2.75 x 20 -- 2.8 mm   COPD mixed type (HCC)    Followed by Dr. Lamonte Sakai "pulmonologist said no COPD"   Depression    Depression with anxiety    Diabetes mellitus type 2 in obese ([place])    Diarrhea    started after cholecystectomy and mass removed from intestine   Dyslipidemia, goal LDL below 70    08/2012: TC 137, TG 200, HDL 32!, LDL 45; on statin (followed by Dr.Deterding)   ESRD (end stage renal disease) ([place]) 1991   s/p Cadaveric Renal  Transplant  [facility] - Dr. Jimmy Footman)    Family history of adverse reaction to anesthesia    mom's bp dropped during/after anesthesia   Fibromyalgia    GERD (gastroesophageal reflux disease)    Glomerulonephritis, chronic, rapidly progressive 1989   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 07/1993   Rescue PTCA of RCA -- referred for CABG.   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 Servando Snare: LIMA-LAD, SVG-bifurcatingD1, SVG-rPDA   History of kidney stones    History of stroke 2012   "right eye stroke- half blind now"   Hypertension associated with diabetes ([place])    Mild aortic stenosis by prior echocardiogram 07/2019   Echo:  Mild aortic stenosis (gradients: Mean 14.3 mmHg -peak 24.9 mmHg).   Morbid obesity (HCC)    MRSA (methicillin resistant staph aureus) culture positive    OSA (obstructive sleep apnea)    no longer on CPAP or home O2, states she doesn't need now after lap band   PAD (peripheral artery  disease) ([place]) 08/2013   LEA Dopplers to be read by Dr. Fletcher Anon   PAF (paroxysmal atrial fibrillation) ([place]) 06/2014   Noted on CardioNet Monitor  - --> rhythm control with Tikosyn (Dr. Rayann Heman); converted from warfarin to apixaban for anticoagulation.   Pneumonia    Recurrent boils    Bilateral Groin   Rheumatoid arthritis ([place])    Per Patient Report; associated with OA   S/p cadaver renal transplant 1991   [facility]   Unstable angina ([place]) 5/01; 3/'02, 8/'03, 10/'04; 1/15   x 5 occurences since Inf-Post STEMI in 1994    Assessment: 65 yr old woman admitted on 01/28/21 with acute on chronic combined systolic and diastolic CHF, NSTEMI/angina/elevated troponins, CAD, PAF (on apixaban, last dose at 0902 AM today), hx of renal transplant, COPD. Pharmacy is consulted to dose IV heparin for ACS, atrial fibrillation. Given recent apixaban exposure, will monitor anticoagulation using aPTT until aPTT and heparin levels correlate.  Heparin level falsely elevated from recent Eliquis.  aPTT below goal.  Goal of Therapy:  Heparin level 0.3-0.7 units/ml aPTT 66-102 sec Monitor platelets by anticoagulation protocol: Yes   Plan:  Increase IV heparin to 1000 units/hr. Recheck aPTT 8 hrs after rate increase. Daily heparin level and CBC. F/u plans for cath today.  [place] Crane, Roylene Reason, BCCP Clinical Pharmacist  01/30/2021 8:04 AM   [facility] pharmacy phone numbers are listed on [email or web address]

## 2021-01-30 NOTE — Progress Notes (Signed)
PROGRESS NOTE    Katherine [name]  YOV:[id number] DOB: [birth year] DOA: 01/28/2021 PCP: Katherine Lima, MD      Brief Narrative:  [name] is a 65 y.o. 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  In the last few days this is gotten progressively worse to the point it was severe and she noted she was gaining weight and wheezing.  In the ER, fully, bilateral interstitial changes.  EKG showed new left bundle, troponin only low 200s.  She was given IV Lasix and the hospitalist service were asked to evaluate.         Assessment & Plan:  Acute on chronic systolic CHF EF 74-12% last Nov. Shortness of breath improving Net neg 1.6 yesterday, Cr normal, K 4.9 after 2 doses 40 meq yesterday - Continue furosemide twice daily - Continue K supplement, down to 20 daily - Strict I/Os, daily weights, telemetry  - Daily monitoring renal function - Consult Cardiology, appreciate cares -Follow-up echo - Continue beta-blocker, Crestor   Possible NSTEMI - Consult cardiology, appreciate recommendations -Continue heparin - Echo and left heart cath tomorrow  ESRD History of [name] transplant Creatinine stable - Continue Imuran, prednisone  History of rheumatoid arthritis No evidence of flare -Continue Imuran, prednisone  Chronic pain - Continue home oxycodone, Flexeril, Cymbalta  Paroxysmal atrial fibrillation Rate controlled - Continue dofetilide -Stop diltiazem   - Increase metoprolol dose - Continue apixaban   Type 2 diabetes, with hyperglycemia  On metformin and glimepiride at home only, got Solu-Medrol in the ER, glucoses here extremely high still increased dose this morning, but still extremely high  His sliding scale to maximum - Start  mealtime insulin - Increase Lantus after cath  - Hold home glimepiride, metformin   Anemia of chronic disease/rheumatoid arthritis Hemoglobin at baseline  COPD Has some sputum, similar to baseline, no wheezing, doubt flare. -Continue LABA, LAMA - Continue bronchodilators -Continue Singulair, PPI, fluticasone  Anxiety -Continue sertraline, Lamictal         Disposition: Status is: Inpatient   Remains inpatient appropriate because: requires ongoing IV Lasix, diannostic work up for troponin not apporpriate for outpatient work up  [employer]: The patient is from: Home              Anticipated d/c is to: Home              Patient currently is not medically stable to d/c.   Difficult to place patient No                 MDM: The below labs and imaging reports reviewed and summarized above.  Medication management as above.    DVT prophylaxis: N/A on heparin  Code Status: FULL Family Communication:             Subjective: Chest pain better. Still somewhat dyspneic, still with some orthopnea.  No confusion, no sputum today.        Objective: Vitals:   01/29/21 1923 01/29/21 2358 01/30/21 0349 01/30/21 0357  BP: 122/65 129/61  (!) 170/89  Pulse: 71 62  71  Resp: 18 16  18   Temp: 98.6 F (37 C) 98.2 F (36.8 C)  98.3 F (36.8 C)  TempSrc: Oral Oral  Oral  SpO2: 92% 92%  94%  Weight:   105.8 kg   Height:        Intake/Output Summary (Last 24 hours) at 01/30/2021 0554 Last data filed at 01/29/2021 2250 Gross per 24 hour  Intake 1200 ml  Output 2800 ml  Net -1600 ml   Filed Weights   01/29/21 0005 01/30/21 0349  Weight: 97.5 kg 105.8 kg    Examination: General appearance: Adult female, lying in bed, no acute distress, interactive     HEENT: Anicteric, conjunctival pink, lids and lashes normal.  No nasal deformity, discharge, or epistaxis. Skin: No suspicious rashes or lesions. Cardiac: Irregularly irregular, I do not appreciate a  murmur, no lower extremity edema, JVP not visible Respiratory: Normal respiratory rate and rhythm, faint crackles at the bases, no wheezing, Abdomen: Abdomen soft without tenderness palpation or guarding, no ascites or distention MSK: Deformities or effusions of the large joints of the upper or lower extremities bilaterally Neuro: Extraocular movements intact, moves all extremities with generalized weakness, speech fluent Psych: Attention normal, affect normal, judgment insight appear normal    Data Reviewed: I have personally reviewed following labs and imaging studies:  CBC: Recent Labs  Lab 01/29/21 0030 01/29/21 0152 01/29/21 0625 01/30/21 0332  WBC 8.1  --  7.4 11.5*  NEUTROABS 5.2  --  6.9  --   HGB 11.8* 11.9* 11.3* 10.7*  HCT 37.5 35.0* 34.9* 33.0*  MCV 96.6  --  94.1 94.3  PLT 222  --  221 093   Basic Metabolic Panel: Recent Labs  Lab 01/29/21 0030 01/29/21 0152 01/29/21 0625 01/30/21 0332  NA 135 135 134* 135  K 4.2 4.0 3.6 4.9  CL 98  --  96* 97*  CO2 25  --  26 28  GLUCOSE 268*  --  419* 396*  BUN 14  --  14 17  CREATININE 0.90  --  0.97 0.98  CALCIUM 9.6  --  9.2 9.2  MG 1.8  --   --   --    GFR: Estimated Creatinine Clearance: 65.9 mL/min (by C-G formula based on SCr of 0.98 mg/dL). Liver Function Tests: Recent Labs  Lab 01/29/21 0030 01/29/21 0625  AST 18 17  ALT 14 12  ALKPHOS 99 96  BILITOT 0.5 0.9  PROT 7.2 6.9  ALBUMIN 3.4* 3.3*   No results for input(s): LIPASE, AMYLASE in the last 168 hours. No results for input(s): AMMONIA in the last 168 hours. Coagulation Profile: No results for input(s): INR, PROTIME in the last 168 hours. Cardiac Enzymes: No results for input(s): CKTOTAL, CKMB, CKMBINDEX, TROPONINI in the last 168 hours. BNP (last 3 results) No results for input(s): PROBNP in the last 8760 hours. HbA1C: No results for input(s): HGBA1C in the last 72 hours. CBG: Recent Labs  Lab 01/29/21 0758 01/29/21 1216 01/29/21 1714  01/29/21 2120 01/30/21 0542  GLUCAP 433* 362* 328* 381* 356*   Lipid Profile: No results for input(s): CHOL, HDL, LDLCALC, TRIG, CHOLHDL, LDLDIRECT in the last 72 hours. Thyroid Function Tests: Recent Labs    01/29/21 0625  TSH 1.176   Anemia Panel: No results for input(s): VITAMINB12, FOLATE, FERRITIN, TIBC, IRON, RETICCTPCT in the last 72 hours. Urine analysis:    Component Value Date/Time   COLORURINE YELLOW 10/30/2020 1759   APPEARANCEUR CLEAR 10/30/2020 1759   LABSPEC 1.009 10/30/2020 1759   PHURINE 7.0 10/30/2020 1759   GLUCOSEU 50 (A) 10/30/2020 1759   GLUCOSEU 100 (A) 08/13/2020 [place] NEGATIVE 10/30/2020 1759  BILIRUBINUR NEGATIVE 10/30/2020 1759   KETONESUR NEGATIVE 10/30/2020 1759   PROTEINUR NEGATIVE 10/30/2020 1759   UROBILINOGEN 0.2 08/13/2020 1550   NITRITE NEGATIVE 10/30/2020 1759   LEUKOCYTESUR NEGATIVE 10/30/2020 1759   Sepsis Labs: @LABRCNTIP (procalcitonin:4,lacticacidven:4)  ) Recent Results (from the past 240 hour(s))  Resp Panel by RT-PCR (Flu A&B, Covid) Nasopharyngeal Swab     Status: None   Collection Time: 01/29/21  1:06 AM   Specimen: Nasopharyngeal Swab; Nasopharyngeal(NP) swabs in vial transport medium  Result Value Ref Range Status   SARS Coronavirus 2 by RT PCR NEGATIVE NEGATIVE Final    Comment: (NOTE) SARS-CoV-2 target nucleic acids are NOT DETECTED.  The SARS-CoV-2 RNA is generally detectable in upper respiratory specimens during the acute phase of infection. The lowest concentration of SARS-CoV-2 viral copies this assay can detect is 138 copies/mL. A negative result does not preclude SARS-Cov-2 infection and should not be used as the sole basis for treatment or other patient management decisions. A negative result may occur with  improper specimen collection/handling, submission of specimen other than nasopharyngeal swab, presence of viral mutation(s) within the areas targeted by this assay, and inadequate number of  viral copies(<138 copies/mL). A negative result must be combined with clinical observations, patient history, and epidemiological information. The expected result is Negative.  Fact Sheet for Patients:  EntrepreneurPulse.com.au  Fact Sheet for Healthcare Providers:  IncredibleEmployment.be  This test is no t yet approved or cleared by the [place] FDA and  has been authorized for detection and/or diagnosis of SARS-CoV-2 by FDA under an Emergency Use Authorization (EUA). This EUA will remain  in effect (meaning this test can be used) for the duration of the COVID-19 declaration under Section 564(b)(1) of the Act, 21 U.S.C.section 360bbb-3(b)(1), unless the authorization is terminated  or revoked sooner.       Influenza A by PCR NEGATIVE NEGATIVE Final   Influenza B by PCR NEGATIVE NEGATIVE Final    Comment: (NOTE) The Xpert Xpress SARS-CoV-2/FLU/RSV plus assay is intended as an aid in the diagnosis of influenza from Nasopharyngeal swab specimens and should not be used as a sole basis for treatment. Nasal washings and aspirates are unacceptable for Xpert Xpress SARS-CoV-2/FLU/RSV testing.  Fact Sheet for Patients: EntrepreneurPulse.com.au  Fact Sheet for Healthcare Providers: IncredibleEmployment.be  This test is not yet approved or cleared by the [place] FDA and has been authorized for detection and/or diagnosis of SARS-CoV-2 by FDA under an Emergency Use Authorization (EUA). This EUA will remain in effect (meaning this test can be used) for the duration of the COVID-19 declaration under Section 564(b)(1) of the Act, 21 U.S.C. section 360bbb-3(b)(1), unless the authorization is terminated or revoked.  Performed at [facility], [place] [address], [place], [place] [address]          Radiology Studies: CT Head Wo Contrast  Result Date: 01/29/2021 CLINICAL DATA:  Delirium and head  trauma. EXAM: CT HEAD WITHOUT CONTRAST TECHNIQUE: Contiguous axial images were obtained from the base of the skull through the vertex without intravenous contrast. COMPARISON:  04/06/2017 FINDINGS: Brain: There is no mass, hemorrhage or extra-axial collection. The size and configuration of the ventricles and extra-axial CSF spaces are normal. There is hypoattenuation of the white matter, most commonly indicating chronic small vessel disease. Vascular: No abnormal hyperdensity of the major intracranial arteries or dural venous sinuses. No intracranial atherosclerosis. Skull: The visualized skull base, calvarium and extracranial soft tissues are normal. Sinuses/Orbits: No fluid levels or advanced mucosal thickening of  the visualized paranasal sinuses. No mastoid or middle ear effusion. The orbits are normal. IMPRESSION: Chronic small vessel disease without acute intracranial abnormality. Electronically Signed   By: Ulyses Jarred M.D.   On: 01/29/2021 02:03   DG Chest Portable 1 View  Result Date: 01/29/2021 CLINICAL DATA:  Shortness of breath EXAM: PORTABLE CHEST 1 VIEW COMPARISON:  None. FINDINGS: Mild cardiomegaly. Remote median sternotomy. Unchanged mild interstitial opacity. The visualized skeletal structures are unremarkable. IMPRESSION: Cardiomegaly and mild interstitial pulmonary edema. Electronically Signed   By: Ulyses Jarred M.D.   On: 01/29/2021 00:55        Scheduled Meds:  arformoterol  15 mcg Nebulization BID   aspirin EC  81 mg Oral Daily   azaTHIOprine  125 mg Oral Daily   budesonide  2 mL Nebulization BID   [START ON 01/31/2021] calcitRIOL  0.25 mcg Oral Q3 days   cyclobenzaprine  20 mg Oral QHS   diltiazem  120 mg Oral Daily   dofetilide  250 mcg Oral BID   DULoxetine  60 mg Oral Daily   fluticasone  2 spray Each Nare Daily   furosemide  40 mg Intravenous Q12H   insulin aspart  0-20 Units Subcutaneous TID WC   insulin aspart  0-5 Units Subcutaneous QHS   insulin glargine  12  Units Subcutaneous Daily   lamoTRIgine  25 mg Oral QHS   LORazepam  1 mg Oral BID   metoprolol tartrate  50 mg Oral BID   montelukast  10 mg Oral QHS   pantoprazole  40 mg Oral Daily   predniSONE  5 mg Oral Daily   rosuvastatin  20 mg Oral Daily   sertraline  100 mg Oral Daily   sodium chloride flush  3 mL Intravenous Q12H   Continuous Infusions:  heparin 850 Units/hr (01/29/21 2207)     LOS: 1 day    Time spent: 35 minutes    Edwin Dada, MD [facility] 01/30/2021, 5:54 AM     Please page though [place] or Epic secure chat:  For password, contact charge nurse

## 2021-01-30 NOTE — Progress Notes (Signed)
PT Cancellation Note  Patient Details Name: Katherine [name] MRN: [id number] DOB: 1956/01/14   Cancelled Treatment:    Reason Eval/Treat Not Completed: Patient declined, no reason specified. Pt refusing PT evaluation at this time, reporting she needs to eat her dinner while it is still hot. PT will attempt to follow up as time allows.   Zenaida Niece 01/30/2021, 5:30 PM

## 2021-01-30 NOTE — Progress Notes (Signed)
Heart Failure Stewardship Pharmacist Progress Note   PCP: [name], MD PCP-Cardiologist: [name], MD    HPI:  65 yo F w/ PMH of CAD s/p stents, renal transplant, diastolic CHF, COPD, and DM who presented to ED complaining of CP and SOB worsening over the past month. Notes weight gain and wheezing as well. CXR showed concern for fluid overload. Started on IV Lasix and admitted for CHF. ECHO from 06/2020 revealed EF 40-45%.   Current HF Medications: - Furosemide 40mg  BID - Metoprolol tartrate 50mg  BID  Prior to admission HF Medications: - Furosemide 40mg  daily - Metoprolol tartrate 50mg  BID  Pertinent Lab Values: Serum creatinine 0.98, BUN 17, Potassium 4.9, Sodium 135, BNP 352, A1c 7.7, CBGs 400s  Vital Signs: Weight: 233 lbs (admission weight: 215 lbs) - one might be wrong Blood pressure: 140s/60s  Heart rate: 80s   Medication Assistance / Insurance Benefits Check: Does the patient have prescription insurance?  Yes Type of insurance plan: [place] Medicaid  Does the patient qualify for medication assistance through manufacturers or grants?   No  Outpatient Pharmacy:  Prior to admission [facility] Is the patient willing to use [facility] at discharge? Pending Is the patient willing to transition their outpatient pharmacy to utilize a [facility] outpatient pharmacy?   Pending    Assessment: 1. Acute on chronic diastolic CHF (EF 54-98%), due to NICM. NYHA class III symptoms. - Scheduled for diagnostic LHC and ECHO tomorrow, recommend changes pending results - Consider d/c diltiazem - Consider Entresto,  - Hold off on spiro with K bump - Hold off on SGLT2 with CBGs   Plan: 1) Medication changes recommended at this time: - Awaiting ECHO and LHC results for recommendations  2) Patient assistance: - Entresto copay $4 - Farxiga copay $4  3)  Education  - To be completed prior to discharge - Will not schedule any TOC f/u unless pt cannot make  [facility] appointment on 6/20  Marya Fossa Hilliard Clark) [place], PharmD Student

## 2021-01-30 NOTE — Progress Notes (Signed)
Progress Note  Patient Name: Katherine [name] Date of Encounter: 01/30/2021  [facility] Cardiologist: [name], MD   Subjective   Complains of some shortness of breath today but denies chest pain.  Inpatient Medications    Scheduled Meds:  arformoterol  15 mcg Nebulization BID   aspirin EC  81 mg Oral Daily   azaTHIOprine  125 mg Oral Daily   budesonide  2 mL Nebulization BID   [START ON 01/31/2021] calcitRIOL  0.25 mcg Oral Q3 days   cyclobenzaprine  20 mg Oral QHS   diltiazem  120 mg Oral Daily   dofetilide  250 mcg Oral BID   DULoxetine  60 mg Oral Daily   fluticasone  2 spray Each Nare Daily   furosemide  40 mg Intravenous Q12H   insulin aspart  0-20 Units Subcutaneous TID WC   insulin aspart  0-5 Units Subcutaneous QHS   insulin glargine  12 Units Subcutaneous Daily   lamoTRIgine  25 mg Oral QHS   LORazepam  1 mg Oral BID   metoprolol tartrate  50 mg Oral BID   montelukast  10 mg Oral QHS   pantoprazole  40 mg Oral Daily   potassium chloride  20 mEq Oral Daily   predniSONE  5 mg Oral Daily   rosuvastatin  20 mg Oral Daily   sertraline  100 mg Oral Daily   sodium chloride flush  3 mL Intravenous Q12H   Continuous Infusions:  heparin 1,000 Units/hr (01/30/21 0809)   magnesium sulfate bolus IVPB     PRN Meds: acetaminophen **OR** acetaminophen, albuterol, cyclobenzaprine, nitroGLYCERIN, oxyCODONE, promethazine   Vital Signs    Vitals:   01/30/21 0357 01/30/21 0400 01/30/21 0733 01/30/21 0810  BP: (!) 170/89 (!) 156/90  (!) 151/69  Pulse: 71   85  Resp: 18     Temp: 98.3 F (36.8 C)   (!) 97.4 F (36.3 C)  TempSrc: Oral   Oral  SpO2: 94%  100% 94%  Weight:      Height:        Intake/Output Summary (Last 24 hours) at 01/30/2021 1021 Last data filed at 01/30/2021 0843 Gross per 24 hour  Intake 1560 ml  Output 2450 ml  Net -890 ml   Last 3 Weights 01/30/2021 01/29/2021 12/25/2020  Weight (lbs) 233 lb 3.2 oz 215 lb 210 lb  Weight (kg)  105.779 kg 97.523 kg 95.255 kg      Telemetry    Sinus rhythm/sinus tachycardia- Personally Reviewed  ECG    Not performed today- Personally Reviewed  Physical Exam   GEN: No acute distress.   Neck: No JVD Cardiac: RRR, no murmurs, rubs, or gallops.  Respiratory: Clear to auscultation bilaterally. GI: Soft, nontender, non-distended  MS: No edema; No deformity. Neuro:  Nonfocal  Psych: Normal affect   Labs    High Sensitivity Troponin:   Recent Labs  Lab 01/29/21 0030 01/29/21 0230 01/29/21 0625 01/29/21 0746  TROPONINIHS 91* 241* 326* 305*      Chemistry Recent Labs  Lab 01/29/21 0030 01/29/21 0152 01/29/21 0625 01/30/21 0332  NA 135 135 134* 135  K 4.2 4.0 3.6 4.9  CL 98  --  96* 97*  CO2 25  --  26 28  GLUCOSE 268*  --  419* 396*  BUN 14  --  14 17  CREATININE 0.90  --  0.97 0.98  CALCIUM 9.6  --  9.2 9.2  PROT 7.2  --  6.9  --  ALBUMIN 3.4*  --  3.3*  --   AST 18  --  17  --   ALT 14  --  12  --   ALKPHOS 99  --  96  --   BILITOT 0.5  --  0.9  --   GFRNONAA >60  --  >60 >60  ANIONGAP 12  --  12 10     Hematology Recent Labs  Lab 01/29/21 0030 01/29/21 0152 01/29/21 0625 01/30/21 0332  WBC 8.1  --  7.4 11.5*  RBC 3.88  --  3.71* 3.50*  HGB 11.8* 11.9* 11.3* 10.7*  HCT 37.5 35.0* 34.9* 33.0*  MCV 96.6  --  94.1 94.3  MCH 30.4  --  30.5 30.6  MCHC 31.5  --  32.4 32.4  RDW 15.3  --  15.3 15.1  PLT 222  --  221 235    BNP Recent Labs  Lab 01/29/21 0030  BNP 352.2*     DDimer No results for input(s): DDIMER in the last 168 hours.   Radiology    CT Head Wo Contrast  Result Date: 01/29/2021 CLINICAL DATA:  Delirium and head trauma. EXAM: CT HEAD WITHOUT CONTRAST TECHNIQUE: Contiguous axial images were obtained from the base of the skull through the vertex without intravenous contrast. COMPARISON:  04/06/2017 FINDINGS: Brain: There is no mass, hemorrhage or extra-axial collection. The size and configuration of the ventricles and  extra-axial CSF spaces are normal. There is hypoattenuation of the white matter, most commonly indicating chronic small vessel disease. Vascular: No abnormal hyperdensity of the major intracranial arteries or dural venous sinuses. No intracranial atherosclerosis. Skull: The visualized skull base, calvarium and extracranial soft tissues are normal. Sinuses/Orbits: No fluid levels or advanced mucosal thickening of the visualized paranasal sinuses. No mastoid or middle ear effusion. The orbits are normal. IMPRESSION: Chronic small vessel disease without acute intracranial abnormality. Electronically Signed   By: Ulyses Jarred M.D.   On: 01/29/2021 02:03   DG Chest Portable 1 View  Result Date: 01/29/2021 CLINICAL DATA:  Shortness of breath EXAM: PORTABLE CHEST 1 VIEW COMPARISON:  None. FINDINGS: Mild cardiomegaly. Remote median sternotomy. Unchanged mild interstitial opacity. The visualized skeletal structures are unremarkable. IMPRESSION: Cardiomegaly and mild interstitial pulmonary edema. Electronically Signed   By: Ulyses Jarred M.D.   On: 01/29/2021 00:55    Cardiac Studies   None performed  Patient Profile     Katherine [name] is a 65 y.o. female with a hx of CAD, s/p CABG with PCI to VG-D1, persistent atrial fib on eliquis and tikosyn mainly maintaining SB, HLD, PAD, renal transplant in 1991 who is being seen 01/29/2021 for the evaluation of chest pain at the request of Dr. Loleta Books.  Assessment & Plan    1: Acute on chronic systolic and diastolic heart failure.  EF in the past has been 40 to 45% with grade 3 diastolic dysfunction.  Her BNP on admission was 352.[name] Kitchen  Her renal function is normal and she is on twice daily IV furosemide with fluid balance of -2 L.  2: Non-STEMI-history of CAD status post MI 1994, CABG in 1995 with multiple Interventions since.  I Have Stented Her Diagonal Branch Vein Graft Back in the Late 90s Early 2000's.  Her Last Cath in 2019 Revealed a Patent Vein to the Distal  Right, Patent Vein to the Diagonal Branch with Moderate In-Stent Restenosis and a Patent LIMA to the LAD.  Troponins Were Positive.  She Is Scheduled for Diagnostic Coronary  Angiography Tomorrow.  3: PAF-Maintaining Sinus Rhythm on Tikosyn.  She Does Have New Left Bundle Branch Block.  This Has Been Reviewed with Dr. Curt Bears.  Her Eliquis Has Been Held and She Is on IV Heparin.  4: History of renal transplant-renal function normal  5: Hyperlipidemia-on Crestor      For questions or updates, please contact [place] Please consult [email or web address] for contact info under        Signed, Quay Burow, MD  01/30/2021, 10:21 AM

## 2021-01-30 NOTE — Progress Notes (Signed)
Pt with an elevated cbg of 465 for lunch. Paged provider Danford and pt to get 20 units of insulin. Will continue to monitor pt.

## 2021-01-30 NOTE — Progress Notes (Addendum)
Pt with an elevated cbg of 465 for lunch. Paged provider Danford. Awaiting further orders on how much insulin to give patient. Will continue to monitor pt. Per Dr. Loleta Books, pt is ok to get her tikosyn daily regardless of her QTc. Will pass on to the next nurse also

## 2021-01-30 NOTE — Progress Notes (Signed)
  Echocardiogram 2D Echocardiogram has been performed.  Katherine [name] 01/30/2021, 5:22 PM

## 2021-01-30 NOTE — Progress Notes (Addendum)
Inpatient Diabetes Program Recommendations  AACE/ADA: New Consensus Statement on Inpatient Glycemic Control (2015)  Target Ranges:  Prepandial:   less than 140 mg/dL      Peak postprandial:   less than 180 mg/dL (1-2 hours)      Critically ill patients:  140 - 180 mg/dL   Lab Results  Component Value Date   GLUCAP 465 (H) 01/30/2021   HGBA1C 7.7 (H) 07/11/2020    Review of Glycemic Control Results for JAMIEE, MILHOLLAND (MRN [id number]) as of 01/30/2021 14:39  Ref. Range 01/29/2021 12:16 01/29/2021 17:14 01/29/2021 21:20 01/30/2021 05:42 01/30/2021 10:56  Glucose-Capillary Latest Ref Range: 70 - 99 mg/dL 362 (H) 328 (H) 381 (H) 356 (H) 465 (H)   Diabetes history: DM 2 Outpatient Diabetes medications:  Basaglar 12 units daily, Novolog 14 units tid with meals, Amaryl 4 mg daily, Metformin 500 mg bid Current orders for Inpatient glycemic control:  Novolog resistant tid with meals and HS Lantus 12 units daily   Inpatient Diabetes Program Recommendations:    Please consider increasing Lantus to 20 units daily.  Also please add Novolog 6 units tid with meals (hold if patient eats less than 50% or NPO). A1C pending.  Thanks,  Adah Perl, RN, BC-ADM Inpatient Diabetes Coordinator Pager [phone]  (8a-5p)

## 2021-01-31 ENCOUNTER — Encounter (HOSPITAL_COMMUNITY)
Admission: EM | Disposition: A | Discharge status: Home / Self Care | Payer: Self-pay | Source: Home / Self Care | Attending: Family Medicine

## 2021-01-31 ENCOUNTER — Encounter (HOSPITAL_COMMUNITY): Payer: Self-pay | Admitting: Internal Medicine

## 2021-01-31 DIAGNOSIS — I25110 Atherosclerotic heart disease of native coronary artery with unstable angina pectoris: Secondary | ICD-10-CM

## 2021-01-31 DIAGNOSIS — I5033 Acute on chronic diastolic (congestive) heart failure: Secondary | ICD-10-CM | POA: Diagnosis not present

## 2021-01-31 HISTORY — PX: LEFT HEART CATH AND CORS/GRAFTS ANGIOGRAPHY: CATH118250

## 2021-01-31 LAB — CBC
HCT: 33.7 % — ABNORMAL LOW (ref 36.0–46.0)
Hemoglobin: 10.4 g/dL — ABNORMAL LOW (ref 12.0–15.0)
MCH: 30.1 pg (ref 26.0–34.0)
MCHC: 30.9 g/dL (ref 30.0–36.0)
MCV: 97.4 fL (ref 80.0–100.0)
Platelets: 234 10*3/uL (ref 150–400)
RBC: 3.46 MIL/uL — ABNORMAL LOW (ref 3.87–5.11)
RDW: 15.6 % — ABNORMAL HIGH (ref 11.5–15.5)
WBC: 8.4 10*3/uL (ref 4.0–10.5)
nRBC: 0 % (ref 0.0–0.2)

## 2021-01-31 LAB — GLUCOSE, CAPILLARY
Glucose-Capillary: 214 mg/dL — ABNORMAL HIGH (ref 70–99)
Glucose-Capillary: 182 mg/dL — ABNORMAL HIGH (ref 70–99)
Glucose-Capillary: 313 mg/dL — ABNORMAL HIGH (ref 70–99)
Glucose-Capillary: 193 mg/dL — ABNORMAL HIGH (ref 70–99)

## 2021-01-31 LAB — PROTIME-INR
INR: 1.2 (ref 0.8–1.2)
Prothrombin Time: 15 seconds (ref 11.4–15.2)

## 2021-01-31 LAB — BASIC METABOLIC PANEL
Anion gap: 12 (ref 5–15)
BUN: 16 mg/dL (ref 8–23)
CO2: 28 mmol/L (ref 22–32)
Calcium: 8.8 mg/dL — ABNORMAL LOW (ref 8.9–10.3)
Chloride: 96 mmol/L — ABNORMAL LOW (ref 98–111)
Creatinine, Ser: 0.96 mg/dL (ref 0.44–1.00)
GFR, Estimated: >60 mL/min (ref >60)
Glucose, Bld: 240 mg/dL — ABNORMAL HIGH (ref 70–99)
Potassium: 3.3 mmol/L — ABNORMAL LOW (ref 3.5–5.1)
Sodium: 136 mmol/L (ref 135–145)

## 2021-01-31 LAB — MAGNESIUM: Magnesium: 2.2 mg/dL (ref 1.7–2.4)

## 2021-01-31 LAB — APTT: aPTT: 72 seconds — ABNORMAL HIGH (ref 24–36)

## 2021-01-31 SURGERY — LEFT HEART CATH AND CORS/GRAFTS ANGIOGRAPHY
Anesthesia: LOCAL

## 2021-01-31 MED ORDER — APIXABAN 5 MG PO TABS
5.0000 mg | ORAL_TABLET | Freq: Two times a day (BID) | ORAL | Status: DC
  Administered 2021-01-31 – 2021-02-03 (×6): 5 mg via ORAL
  Filled 2021-01-31 (×6): qty 1

## 2021-01-31 MED ORDER — IOHEXOL 350 MG/ML SOLN
INTRAVENOUS | Status: DC | PRN
  Administered 2021-01-31: 60 mL via INTRA_ARTERIAL

## 2021-01-31 MED ORDER — SODIUM CHLORIDE 0.9% FLUSH: 3.0000 mL | INTRAVENOUS | Status: DC | PRN

## 2021-01-31 MED ORDER — VERAPAMIL HCL 2.5 MG/ML IV SOLN
INTRAVENOUS | Status: DC | PRN
  Administered 2021-01-31: 8 mL via INTRA_ARTERIAL

## 2021-01-31 MED ORDER — HEPARIN SODIUM (PORCINE) 1000 UNIT/ML IJ SOLN
INTRAMUSCULAR | Status: AC
  Filled 2021-01-31: qty 1

## 2021-01-31 MED ORDER — LIDOCAINE HCL (PF) 1 % IJ SOLN
INTRAMUSCULAR | Status: AC
  Filled 2021-01-31: qty 30

## 2021-01-31 MED ORDER — HEPARIN (PORCINE) IN NACL 1000-0.9 UT/500ML-% IV SOLN
INTRAVENOUS | Status: DC | PRN
  Administered 2021-01-31 (×2): 500 mL

## 2021-01-31 MED ORDER — ROSUVASTATIN CALCIUM 20 MG PO TABS
40.0000 mg | ORAL_TABLET | Freq: Every day | ORAL | Status: DC
  Administered 2021-02-01 – 2021-02-03 (×3): 40 mg via ORAL
  Filled 2021-01-31 (×3): qty 2

## 2021-01-31 MED ORDER — LIDOCAINE HCL (PF) 1 % IJ SOLN
INTRAMUSCULAR | Status: DC | PRN
  Administered 2021-01-31: 2 mL

## 2021-01-31 MED ORDER — MIDAZOLAM HCL 2 MG/2ML IJ SOLN
INTRAMUSCULAR | Status: AC
  Filled 2021-01-31: qty 2

## 2021-01-31 MED ORDER — HEPARIN SODIUM (PORCINE) 1000 UNIT/ML IJ SOLN
INTRAMUSCULAR | Status: DC | PRN
  Administered 2021-01-31: 5000 [IU] via INTRAVENOUS

## 2021-01-31 MED ORDER — POTASSIUM CHLORIDE CRYS ER 20 MEQ PO TBCR
40.0000 meq | EXTENDED_RELEASE_TABLET | Freq: Once | ORAL | Status: AC
  Administered 2021-01-31: 40 meq via ORAL
  Filled 2021-01-31: qty 2

## 2021-01-31 MED ORDER — LABETALOL HCL 5 MG/ML IV SOLN: 10.0000 mg | INTRAVENOUS | Status: AC | PRN

## 2021-01-31 MED ORDER — ONDANSETRON HCL 4 MG/2ML IJ SOLN: 4.0000 mg | Freq: Four times a day (QID) | INTRAMUSCULAR | Status: DC | PRN

## 2021-01-31 MED ORDER — SODIUM CHLORIDE 0.9 % IV SOLN: 250.0000 mL | INTRAVENOUS | Status: DC | PRN

## 2021-01-31 MED ORDER — VERAPAMIL HCL 2.5 MG/ML IV SOLN
INTRAVENOUS | Status: AC
  Filled 2021-01-31: qty 2

## 2021-01-31 MED ORDER — ACETAMINOPHEN 325 MG PO TABS
650.0000 mg | ORAL_TABLET | ORAL | Status: DC | PRN
  Administered 2021-02-01 – 2021-02-02 (×2): 650 mg via ORAL
  Filled 2021-01-31 (×2): qty 2

## 2021-01-31 MED ORDER — HEPARIN (PORCINE) IN NACL 1000-0.9 UT/500ML-% IV SOLN
INTRAVENOUS | Status: AC
  Filled 2021-01-31: qty 1000

## 2021-01-31 MED ORDER — MIDAZOLAM HCL 2 MG/2ML IJ SOLN
INTRAMUSCULAR | Status: DC | PRN
  Administered 2021-01-31: 2 mg via INTRAVENOUS
  Administered 2021-01-31: 1 mg via INTRAVENOUS

## 2021-01-31 MED ORDER — VERAPAMIL HCL 2.5 MG/ML IV SOLN
INTRAVENOUS | Status: DC | PRN
  Administered 2021-01-31: 10 mL via INTRA_ARTERIAL

## 2021-01-31 MED ORDER — FENTANYL CITRATE (PF) 100 MCG/2ML IJ SOLN
INTRAMUSCULAR | Status: DC | PRN
  Administered 2021-01-31 (×2): 25 ug via INTRAVENOUS

## 2021-01-31 MED ORDER — HEPARIN (PORCINE) 25000 UT/250ML-% IV SOLN: 1250.0000 [IU]/h | INTRAVENOUS | Status: DC

## 2021-01-31 MED ORDER — FENTANYL CITRATE (PF) 100 MCG/2ML IJ SOLN
INTRAMUSCULAR | Status: AC
  Filled 2021-01-31: qty 2

## 2021-01-31 MED ORDER — SODIUM CHLORIDE 0.9% FLUSH
3.0000 mL | Freq: Two times a day (BID) | INTRAVENOUS | Status: DC
  Administered 2021-01-31 – 2021-02-03 (×5): 3 mL via INTRAVENOUS

## 2021-01-31 MED ORDER — HYDRALAZINE HCL 20 MG/ML IJ SOLN: 10.0000 mg | INTRAMUSCULAR | Status: AC | PRN

## 2021-01-31 MED ORDER — POTASSIUM CHLORIDE CRYS ER 20 MEQ PO TBCR
20.0000 meq | EXTENDED_RELEASE_TABLET | Freq: Once | ORAL | Status: AC
  Administered 2021-01-31: 20 meq via ORAL
  Filled 2021-01-31: qty 1

## 2021-01-31 MED ORDER — SODIUM CHLORIDE 0.9 % IV SOLN: INTRAVENOUS | Status: AC

## 2021-01-31 SURGICAL SUPPLY — 17 items
CATH INFINITI 5 FR AR1 MOD (CATHETERS) ×1 IMPLANT
CATH INFINITI 5FR AL1 (CATHETERS) ×1 IMPLANT
CATH INFINITI 5FR MULTPACK ANG (CATHETERS) ×1 IMPLANT
CATH LAUNCHER 5F RADR (CATHETERS) IMPLANT
CATHETER LAUNCHER 5F RADR (CATHETERS) ×2
DEVICE RAD COMP TR BAND LRG (VASCULAR PRODUCTS) ×1 IMPLANT
GLIDESHEATH SLEND SS 6F .021 (SHEATH) ×1 IMPLANT
GUIDEWIRE ANGLED .035X150CM (WIRE) ×1 IMPLANT
GUIDEWIRE ANGLED .035X260CM (WIRE) ×1 IMPLANT
GUIDEWIRE INQWIRE 1.5J.035X260 (WIRE) IMPLANT
INQWIRE 1.5J .035X260CM (WIRE) ×2
KIT HEART LEFT (KITS) ×2 IMPLANT
PACK CARDIAC CATHETERIZATION (CUSTOM PROCEDURE TRAY) ×2 IMPLANT
SHEATH PINNACLE 5F 10CM (SHEATH) ×1 IMPLANT
SHEATH PROBE COVER 6X72 (BAG) ×1 IMPLANT
TRANSDUCER W/STOPCOCK (MISCELLANEOUS) ×2 IMPLANT
WIRE EMERALD 3MM-J .035X150CM (WIRE) ×1 IMPLANT

## 2021-01-31 NOTE — Progress Notes (Addendum)
ANTICOAGULATION CONSULT NOTE - Initial Consult  Pharmacy Consult for heparin Indication: chest pain/ACS w/ hx AFib  Allergies  Allergen Reactions   Tetracycline Hives    Patient tolerated Doxycycline Dec 2020   Niacin Other (See Comments)    Mouth blisters   Niaspan [Niacin Er] Other (See Comments)    Mouth blisters   Sulfa Antibiotics Nausea Only and Other (See Comments)    "Tears up stomach"   Sulfonamide Derivatives Other (See Comments)    Reaction: per patient "tears her stomach up"   Codeine Nausea And Vomiting   Erythromycin Nausea And Vomiting   Hydromorphone Hcl Nausea And Vomiting   Morphine And Related Nausea And Vomiting   Nalbuphine Nausea And Vomiting    Nubain   Sulfasalazine Nausea Only and Other (See Comments)    per patient "tears her stomach up", "Tears up stomach"   Tape Rash and Other (See Comments)    No "plastic" tape," please----cloth tape only    Patient Measurements: Height: 5\' 1"  (154.9 cm) Weight: 105.2 kg (232 lb) IBW/kg (Calculated) : 47.8 Heparin Dosing Weight: 71.1 kg   Vital Signs: Temp: 98 F (36.7 C) (06/17 0434) Temp Source: Oral (06/17 0434) BP: 90/71 (06/17 1300) Pulse Rate: 0 (06/17 1158)  Labs: Recent Labs    01/29/21 0230 01/29/21 0625 01/29/21 0746 01/30/21 0332 01/30/21 1644 01/31/21 0143  HGB  --  11.3*  --  10.7*  --  10.4*  HCT  --  34.9*  --  33.0*  --  33.7*  PLT  --  221  --  235  --  234  APTT  --   --   --  43* 49* 72*  LABPROT  --   --   --   --   --  15.0  INR  --   --   --   --   --  1.2  HEPARINUNFRC  --   --   --  >1.10*  --   --   CREATININE  --  0.97  --  0.98  --  0.96  TROPONINIHS 241* 326* 305*  --   --   --     Estimated Creatinine Clearance: 67 mL/min (by C-G formula based on SCr of 0.96 mg/dL).   Medical History: Past Medical History:  Diagnosis Date   Anemia    Anxiety    Bilateral carotid artery stenosis    Carotid duplex 09/4266: 3-41% LICA, 96-22% RICA, >29% RECA, f/u 1 yr  suggested   CAD (coronary artery disease) of bypass graft 5/01; 3/'02, 8/'03, 10/'04; 1/15   PCI x 5 to SVG-D1    CAD in native artery 07/1993   3 Vessel Disease (LAD-D1 & RCA) -- CABG (Dx in setting of inferior STEMI-PTCA of RCA)   CAD S/P percutaneous coronary angioplasty    PCI to SVG-D1 insertion/native D1 x 4 = '01 -(S660 BMS 2.5 x 9 anastomosis- D1); '02 - distal overlap ACS Pixel 2.5 x 8  BMS; '03 distal/native ISR/Thrombosis - Pixel 2.5 x 13; '04 - ISR-  Taxus 2.5 x 20 (covered all);; 1/15 - mid SVG-D1 (50% distal ISR) - Promus P 2.75 x 20 -- 2.8 mm   COPD mixed type (HCC)    Followed by Dr. Lamonte Sakai "pulmonologist said no COPD"   Depression    Depression with anxiety    Diabetes mellitus type 2 in obese ([place])    Diarrhea    started after cholecystectomy and mass removed from intestine  Dyslipidemia, goal LDL below 70    08/2012: TC 137, TG 200, HDL 32!, LDL 45; on statin (followed by Dr.Deterding)   ESRD (end stage renal disease) ([place]) 1991   s/p Cadaveric Renal Transplant [facility] - Dr. Jimmy Footman)    Family history of adverse reaction to anesthesia    mom's bp dropped during/after anesthesia   Fibromyalgia    GERD (gastroesophageal reflux disease)    Glomerulonephritis, chronic, rapidly progressive 1989   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 07/1993   Rescue PTCA of RCA -- referred for CABG.   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 Servando Snare: LIMA-LAD, SVG-bifurcatingD1, SVG-rPDA   History of kidney stones    History of stroke 2012   "right eye stroke- half blind now"   Hypertension associated with diabetes ([place])    Mild aortic stenosis by prior echocardiogram 07/2019   Echo:  Mild aortic stenosis (gradients: Mean 14.3 mmHg -peak 24.9 mmHg).   Morbid obesity (HCC)    MRSA (methicillin resistant staph aureus) culture positive    OSA (obstructive sleep apnea)    no longer on CPAP or home O2,  states she doesn't need now after lap band   PAD (peripheral artery disease) ([place]) 08/2013   LEA Dopplers to be read by Dr. Fletcher Anon   PAF (paroxysmal atrial fibrillation) ([place]) 06/2014   Noted on CardioNet Monitor  - --> rhythm control with Tikosyn (Dr. Rayann Heman); converted from warfarin to apixaban for anticoagulation.   Pneumonia    Recurrent boils    Bilateral Groin   Rheumatoid arthritis ([place])    Per Patient Report; associated with OA   S/p cadaver renal transplant 1991   [facility]   Unstable angina ([place]) 5/01; 3/'02, 8/'03, 10/'04; 1/15   x 5 occurences since Inf-Post STEMI in 1994    Medications:  Scheduled:   arformoterol  15 mcg Nebulization BID   aspirin EC  81 mg Oral Daily   azaTHIOprine  125 mg Oral Daily   budesonide  2 mL Nebulization BID   calcitRIOL  0.25 mcg Oral Q3 days   cyclobenzaprine  20 mg Oral QHS   dofetilide  250 mcg Oral BID   DULoxetine  60 mg Oral Daily   fluticasone  2 spray Each Nare Daily   insulin aspart  0-20 Units Subcutaneous TID WC   insulin aspart  0-5 Units Subcutaneous QHS   insulin aspart  6 Units Subcutaneous TID WC   insulin glargine  12 Units Subcutaneous Daily   lamoTRIgine  25 mg Oral QHS   LORazepam  1 mg Oral BID   metoprolol tartrate  100 mg Oral BID   montelukast  10 mg Oral QHS   pantoprazole  40 mg Oral Daily   predniSONE  5 mg Oral Daily   rosuvastatin  20 mg Oral Daily   sertraline  100 mg Oral Daily   sodium chloride flush  3 mL Intravenous Q12H   sodium chloride flush  3 mL Intravenous Q12H    Assessment: 8 yof with known hx of Afib - on apixaban PTA (LD 6/15@0902 ), admitted with NSTEMI.  Underwent cardiac cath finding Lcx with 60% ostial lesion and SVG to D2 with 90-95% lesion after graft insertion. Plan for heparin to restart 6 hours after sheath removal (documented on 6/17@1156 ). Hgb 10.4, plt 234. No s/sx of bleeding.   Goal of Therapy:  Heparin  level 0.3-0.7 units/ml aPTT 66-102 seconds Monitor platelets by  anticoagulation protocol: Yes   Plan:  Restart heparin infusion at 1250 units/hr on 6/17@1800  Order aPTT 6 hr after restart Monitor HL/aPTT until correlate, CBC, and for s/sx of bleeding F/u apixaban restart   Antonietta Jewel, PharmD, [place] Clinical Pharmacist  Phone: [phone] 01/31/2021 1:55 PM  Please check AMION for all [place] phone numbers After 10:00 PM, call [place] [phone]  ADDENDUM  Talked with Reino Bellis who said to change to apixaban instead of heparin tonight - plan for medical management and discharge tonight if stable.   Antonietta Jewel, PharmD, [place] Clinical Pharmacist

## 2021-01-31 NOTE — Progress Notes (Signed)
ANTICOAGULATION CONSULT NOTE  Pharmacy Consult for Heparin Indication: chest pain/ACS and atrial fibrillation  Allergies  Allergen Reactions   Tetracycline Hives    Patient tolerated Doxycycline Dec 2020   Niacin Other (See Comments)    Mouth blisters   Niaspan [Niacin Er] Other (See Comments)    Mouth blisters   Sulfa Antibiotics Nausea Only and Other (See Comments)    "Tears up stomach"   Sulfonamide Derivatives Other (See Comments)    Reaction: per patient "tears her stomach up"   Codeine Nausea And Vomiting   Erythromycin Nausea And Vomiting   Hydromorphone Hcl Nausea And Vomiting   Morphine And Related Nausea And Vomiting   Nalbuphine Nausea And Vomiting    Nubain   Sulfasalazine Nausea Only and Other (See Comments)    per patient "tears her stomach up", "Tears up stomach"   Tape Rash and Other (See Comments)    No "plastic" tape," please----cloth tape only    Patient Measurements: Height: 5\' 1"  (154.9 cm) Weight: 105.8 kg (233 lb 3.2 oz) IBW/kg (Calculated) : 47.8 Heparin Dosing Weight:  71.1 kg  Vital Signs: Temp: 98.8 F (37.1 C) (06/16 2357) Temp Source: Oral (06/16 2357) BP: 124/60 (06/16 2357) Pulse Rate: 53 (06/16 2357)  Labs: Recent Labs    01/29/21 0030 01/29/21 0152 01/29/21 0230 01/29/21 0625 01/29/21 0746 01/30/21 0332 01/30/21 1644 01/31/21 0143  HGB 11.8*   < >  --  11.3*  --  10.7*  --  10.4*  HCT 37.5   < >  --  34.9*  --  33.0*  --  33.7*  PLT 222  --   --  221  --  235  --  234  APTT  --   --   --   --   --  43* 49* 72*  LABPROT  --   --   --   --   --   --   --  15.0  INR  --   --   --   --   --   --   --  1.2  HEPARINUNFRC  --   --   --   --   --  >1.10*  --   --   CREATININE 0.90  --   --  0.97  --  0.98  --   --   TROPONINIHS 91*  --  241* 326* 305*  --   --   --    < > = values in this interval not displayed.     Estimated Creatinine Clearance: 65.9 mL/min (by C-G formula based on SCr of 0.98 mg/dL).   Assessment: 65  y.o. female admitted with NSTEMI, h/o Afib and Eliquis on hold, for heparin   Goal of Therapy:  Heparin level 0.3-0.7 units/ml aPTT 66-102 sec Monitor platelets by anticoagulation protocol: Yes   Plan:  Continue Heparin at current rate  Follow up after cath today   Phillis Knack, PharmD, BCPS

## 2021-01-31 NOTE — Plan of Care (Signed)
  Problem: Nutrition: Goal: Adequate nutrition will be maintained Outcome: Completed/Met

## 2021-01-31 NOTE — Progress Notes (Signed)
Orthopedic Tech Progress Note Patient Details:  Katherine [name] Jan 16, 1956 [id number]   Ortho Devices Type of Ortho Device: Arm sling Ortho Device/Splint Location: RUE Ortho Device/Splint Interventions: Ordered, Application   Post Interventions Patient Tolerated: Well Instructions Provided: Care of device, Adjustment of device  [name] [name] 01/31/2021, 1:47 PM

## 2021-01-31 NOTE — Progress Notes (Signed)
PROGRESS NOTE    [name]  HEN:[id number] DOB: 04-Aug-1956 DOA: 01/28/2021 PCP: [name], MD      Brief Narrative:  Katherine [name] is a 65 y.o. 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  In the last few days this is gotten progressively worse to the point it was severe and she noted she was gaining weight and wheezing.  In the ER, fully, bilateral interstitial changes.  EKG showed new left bundle, troponin only low 200s.  She was given IV Lasix and the hospitalist service were asked to evaluate.  --------------------------------------------------------------------------------------------------------------------------------------   Subjective: The patient was seen and examined this morning, sitting up in chair, denies any chest pain or shortness of breath. N.p.o. Pending left cardiac cath this a.m.    Assessment & Plan:  Acute on chronic systolic CHF -Currently stable denies any chest pain or shortness of breath Improved shortness of breath from yesterday EF 40-45% last Nov.  Monitoring electrolytes potassium, repleting accordingly - Continue furosemide twice daily - Continue K supplement, down to 20 daily - Strict I/Os, daily weights, telemetry  - Daily monitoring renal function - Consult Cardiology, appreciate cares -Echo:  - Continue beta-blocker, Crestor   Possible NSTEMI - Consult cardiology, appreciate recommendations -Continue heparin - Echo and left heart cath today 01/31/2021 -Stable  ESRD- History of kidney transplant Creatinine stable - Continue Imuran, prednisone  History of rheumatoid arthritis No evidence of flare -stable -Continue Imuran, prednisone  Chronic pain - Continue home oxycodone, Flexeril,  Cymbalta -Stable  Paroxysmal atrial fibrillation Rate controlled - Continue dofetilide -Stop diltiazem   - Increase metoprolol dose - Continue apixaban -Stable   Type 2 diabetes, with hyperglycemia  On metformin and glimepiride at home only, got Solu-Medrol in the ER, glucoses here extremely high still increased dose this morning, but still extremely high  His sliding scale to maximum - Start mealtime insulin - Increase Lantus after cath  - Hold home glimepiride, metformin   Anemia of chronic disease/rheumatoid arthritis Hemoglobin at baseline -Stable  COPD -Satting 100% on 3 L of oxygen, otherwise stable Has some sputum, similar to baseline, no wheezing, doubt flare. -Continue LABA, LAMA - Continue bronchodilators -Continue Singulair, PPI, fluticasone  Anxiety -Continue sertraline, Lamictal -Stable     Disposition: Status is: Inpatient   Remains inpatient appropriate because: requires ongoing IV Lasix, diannostic work up for troponin not apporpriate for outpatient work up  [employer]: The patient is from: Home              Anticipated d/c is to: Home              Patient currently is not medically stable to d/c.   Difficult to place patient No     MDM: The below labs and imaging reports reviewed and summarized above.  Medication management as above.    DVT prophylaxis: N/A on heparin  Code Status: FULL Family Communication: none      Objective: Vitals:   01/31/21 0434 01/31/21 0621 01/31/21 0755 01/31/21 1043  BP: (!) 146/66  (!) 145/73   Pulse: (!) 55  62   Resp: 18     Temp: 98 F (36.7 C)     TempSrc: Oral     SpO2: 99%  100% 100%  Weight:  105.2 kg    Height:        Intake/Output Summary (Last 24 hours) at 01/31/2021 1126 Last data filed at 01/31/2021 0850 Gross per 24 hour  Intake 1057.32 ml  Output 3200 ml  Net -2142.68 ml   Filed Weights   01/29/21 0005 01/30/21 0349 01/31/21 0621  Weight: 97.5 kg 105.8 kg 105.2 kg        Physical Exam:   General:  Alert, oriented, cooperative, no distress;   HEENT:  Normocephalic, PERRL, otherwise with in Normal limits   Neuro:  CNII-XII intact. , normal motor and sensation, reflexes intact   Lungs:   Clear to auscultation BL, Respirations unlabored, no wheezes / crackles  Cardio:    S1/S2, RRR, No murmure, No Rubs or Gallops   Abdomen:   Soft, non-tender, bowel sounds active all four quadrants,  no guarding or peritoneal signs.  Muscular skeletal:  Limited exam - in bed, able to move all 4 extremities, Normal strength,  2+ pulses,  symmetric, No pitting edema  Skin:  Dry, warm to touch, negative for any Rashes,  Wounds: Please see nursing documentation         Data Reviewed: I have personally reviewed following labs and imaging studies:  CBC: Recent Labs  Lab 01/29/21 0030 01/29/21 0152 01/29/21 0625 01/30/21 0332 01/31/21 0143  WBC 8.1  --  7.4 11.5* 8.4  NEUTROABS 5.2  --  6.9  --   --   HGB 11.8* 11.9* 11.3* 10.7* 10.4*  HCT 37.5 35.0* 34.9* 33.0* 33.7*  MCV 96.6  --  94.1 94.3 97.4  PLT 222  --  221 235 233   Basic Metabolic Panel: Recent Labs  Lab 01/29/21 0030 01/29/21 0152 01/29/21 0625 01/30/21 0332 01/31/21 0143  NA 135 135 134* 135 136  K 4.2 4.0 3.6 4.9 3.3*  CL 98  --  96* 97* 96*  CO2 25  --  26 28 28   GLUCOSE 268*  --  419* 396* 240*  BUN 14  --  14 17 16   CREATININE 0.90  --  0.97 0.98 0.96  CALCIUM 9.6  --  9.2 9.2 8.8*  MG 1.8  --   --   --  2.2   GFR: Estimated Creatinine Clearance: 67 mL/min (by C-G formula based on SCr of 0.96 mg/dL). Liver Function Tests: Recent Labs  Lab 01/29/21 0030 01/29/21 0625  AST 18 17  ALT 14 12  ALKPHOS 99 96  BILITOT 0.5 0.9  PROT 7.2 6.9  ALBUMIN 3.4* 3.3*   No results for input(s): LIPASE, AMYLASE in the last 168 hours. No results for input(s): AMMONIA in the last 168 hours. Coagulation Profile: Recent Labs  Lab 01/31/21 0143  INR 1.2   Cardiac Enzymes: No  results for input(s): CKTOTAL, CKMB, CKMBINDEX, TROPONINI in the last 168 hours. BNP (last 3 results) No results for input(s): PROBNP in the last 8760 hours. HbA1C: No results for input(s): HGBA1C in the last 72 hours. CBG: Recent Labs  Lab 01/30/21 0542 01/30/21 1056 01/30/21 1528 01/30/21 2143 01/31/21 0604  GLUCAP 356* 465* 369* 79 214*   Lipid Profile: No results for input(s): CHOL, HDL, LDLCALC, TRIG, CHOLHDL, LDLDIRECT in the last 72 hours. Thyroid Function Tests: Recent Labs    01/29/21 0625  TSH 1.176   Anemia Panel: No results for input(s): VITAMINB12, FOLATE, FERRITIN, TIBC, IRON, RETICCTPCT in the last 72 hours. Urine analysis:    Component Value Date/Time   COLORURINE YELLOW 10/30/2020  [place] 10/30/2020 1759   LABSPEC 1.009 10/30/2020 1759   PHURINE 7.0 10/30/2020 1759   GLUCOSEU 50 (A) 10/30/2020 1759   GLUCOSEU 100 (A) 08/13/2020 1550   HGBUR NEGATIVE 10/30/2020 1759   BILIRUBINUR NEGATIVE 10/30/2020 1759   KETONESUR NEGATIVE 10/30/2020 1759   PROTEINUR NEGATIVE 10/30/2020 1759   UROBILINOGEN 0.2 08/13/2020 1550   NITRITE NEGATIVE 10/30/2020 1759   LEUKOCYTESUR NEGATIVE 10/30/2020 1759   Sepsis Labs: @LABRCNTIP (procalcitonin:4,lacticacidven:4)  ) Recent Results (from the past 240 hour(s))  Resp Panel by RT-PCR (Flu A&B, Covid) Nasopharyngeal Swab     Status: None   Collection Time: 01/29/21  1:06 AM   Specimen: Nasopharyngeal Swab; Nasopharyngeal(NP) swabs in vial transport medium  Result Value Ref Range Status   SARS Coronavirus 2 by RT PCR NEGATIVE NEGATIVE Final    Comment: (NOTE) SARS-CoV-2 target nucleic acids are NOT DETECTED.  The SARS-CoV-2 RNA is generally detectable in upper respiratory specimens during the acute phase of infection. The lowest concentration of SARS-CoV-2 viral copies this assay can detect is 138 copies/mL. A negative result does not preclude SARS-Cov-2 infection and should not be used as the sole  basis for treatment or other patient management decisions. A negative result may occur with  improper specimen collection/handling, submission of specimen other than nasopharyngeal swab, presence of viral mutation(s) within the areas targeted by this assay, and inadequate number of viral copies(<138 copies/mL). A negative result must be combined with clinical observations, patient history, and epidemiological information. The expected result is Negative.  Fact Sheet for Patients:  EntrepreneurPulse.com.au  Fact Sheet for Healthcare Providers:  IncredibleEmployment.be  This test is no t yet approved or cleared by the [place] FDA and  has been authorized for detection and/or diagnosis of SARS-CoV-2 by FDA under an Emergency Use Authorization (EUA). This EUA will remain  in effect (meaning this test can be used) for the duration of the COVID-19 declaration under Section 564(b)(1) of the Act, 21 U.S.C.section 360bbb-3(b)(1), unless the authorization is terminated  or revoked sooner.       Influenza A by PCR NEGATIVE NEGATIVE Final   Influenza B by PCR NEGATIVE NEGATIVE Final    Comment: (NOTE) The Xpert Xpress SARS-CoV-2/FLU/RSV plus assay is intended as an aid in the diagnosis of influenza from Nasopharyngeal swab specimens and should not be used as a sole basis for treatment. Nasal washings and aspirates are unacceptable for Xpert Xpress SARS-CoV-2/FLU/RSV testing.  Fact Sheet for Patients: EntrepreneurPulse.com.au  Fact Sheet for Healthcare Providers: IncredibleEmployment.be  This test is not yet approved or cleared by the [place] FDA and has been authorized for detection and/or diagnosis of SARS-CoV-2 by FDA under an Emergency Use Authorization (EUA). This EUA will remain in effect (meaning this test can be used) for the duration of the COVID-19 declaration under Section 564(b)(1) of the Act,  21 U.S.C. section 360bbb-3(b)(1), unless the authorization is terminated or revoked.  Performed at [facility], [place] [address], [place], [place] [address]          Radiology Studies: No results found.      Scheduled Meds:  [MAR Hold] arformoterol  15 mcg Nebulization BID   [MAR Hold] aspirin EC  81 mg Oral Daily   [MAR Hold] azaTHIOprine  125 mg Oral Daily   [MAR Hold] budesonide  2 mL Nebulization BID   [MAR Hold] calcitRIOL  0.25 mcg Oral Q3 days   [MAR Hold] cyclobenzaprine  20 mg Oral QHS   [MAR  Hold] dofetilide  250 mcg Oral BID   [MAR Hold] DULoxetine  60 mg Oral Daily   [MAR Hold] fluticasone  2 spray Each Nare Daily   [MAR Hold] furosemide  40 mg Intravenous Q12H   [MAR Hold] insulin aspart  0-20 Units Subcutaneous TID WC   [MAR Hold] insulin aspart  0-5 Units Subcutaneous QHS   [MAR Hold] insulin aspart  6 Units Subcutaneous TID WC   [MAR Hold] insulin glargine  12 Units Subcutaneous Daily   [MAR Hold] lamoTRIgine  25 mg Oral QHS   [MAR Hold] LORazepam  1 mg Oral BID   [MAR Hold] metoprolol tartrate  100 mg Oral BID   [MAR Hold] montelukast  10 mg Oral QHS   [MAR Hold] pantoprazole  40 mg Oral Daily   [MAR Hold] predniSONE  5 mg Oral Daily   [MAR Hold] rosuvastatin  20 mg Oral Daily   [MAR Hold] sertraline  100 mg Oral Daily   [MAR Hold] sodium chloride flush  3 mL Intravenous Q12H   Continuous Infusions:  sodium chloride     sodium chloride 1 mL/kg/hr (01/31/21 0231)   heparin 1,250 Units/hr (01/31/21 0133)     LOS: 2 days    Time spent: 35 minutes    Deatra James, MD [facility] 01/31/2021, 11:26 AM     Please page though AMION or Epic secure chat:  For password, contact charge nurse

## 2021-01-31 NOTE — Interval H&P Note (Signed)
History and Physical Interval Note:  01/31/2021 10:26 AM  Katherine [name]  has presented today for surgery, with the diagnosis of nstemi.  The various methods of treatment have been discussed with the patient and family. After consideration of risks, benefits and other options for treatment, the patient has consented to  Procedure(s): LEFT HEART CATH AND CORS/GRAFTS ANGIOGRAPHY (N/A) and possible coronary angioplasty as a surgical intervention.  The patient's history has been reviewed, patient examined, no change in status, stable for surgery.  I have reviewed the patient's chart and labs.  Questions were answered to the patient's satisfaction.     Lavaughn Bisig

## 2021-01-31 NOTE — Care Management Important Message (Signed)
Important Message  Patient Details  Name: Katherine [name] MRN: [id number] Date of Birth: 1956-05-20   Medicare Important Message Given:  Yes  Due to illness patient could not sign patient gave me permission on her behalf. to si   Orbie Pyo 01/31/2021, 2:47 PM

## 2021-01-31 NOTE — Evaluation (Signed)
Physical Therapy Evaluation Patient Details Name: Katherine [name] MRN: [id number] DOB: 1955/12/24 Today's Date: 01/31/2021   History of Present Illness  65 y.o. female presents to [facility] ED on 01/29/2021 with reports of SOB and chest pain. Pt reports chest pain at rest and with exertion over the past month. Chest x-ray concerning for fluid overload, EKG with LBBB, troponins uptrending in ED. Pt admitted for acute on chronic systolic CHF. PMH includes CAD status post stenting, renal transplant, diastolic CHF, COPD, diabetes mellitus type 2.  Clinical Impression  Pt demonstrates deficits in gait, balance, activity tolerance, and safety awareness. Pt performs transfers without the need for physical assistance, requiring cues for hand placement and device management. Pt ambulates for household distances with rollator, limited by fatigue. Pt declines ambulation attempts without AD at this time, reporting that she feels weak. Pt may benefit from gait training without an AD at next session, to return to prior level, and from stair training as she reports difficulty negotiating the steps she has to enter her home. Pt will benefit from continued acute PT to improve independence in mobility and aid to return to PLOF.     Follow Up Recommendations No PT follow up    Equipment Recommendations  None recommended by PT    Recommendations for Other Services       Precautions / Restrictions Precautions Precautions: Fall Restrictions Weight Bearing Restrictions: No      Mobility  Bed Mobility Overal bed mobility: Modified Independent             General bed mobility comments: HOB elevated and heavy use of railing.    Transfers Overall transfer level: Needs assistance Equipment used: Rolling walker (2 wheeled) Transfers: Sit to/from Stand Sit to Stand: Min guard         General transfer comment: VC for hand placement.  Ambulation/Gait Ambulation/Gait assistance: Supervision Gait  Distance (Feet): 240 Feet Assistive device: 4-wheeled walker Gait Pattern/deviations: Decreased stride length;Wide base of support Gait velocity: reduced Gait velocity interpretation: 1.31 - 2.62 ft/sec, indicative of limited community ambulator General Gait Details: Pt with slow, steady gait with rollator, requiring 3 standing rest breaks.  Stairs            Wheelchair Mobility    Modified Rankin (Stroke Patients Only)       Balance Overall balance assessment: Needs assistance Sitting-balance support: Feet supported Sitting balance-Leahy Scale: Fair     Standing balance support: During functional activity Standing balance-Leahy Scale: Fair Standing balance comment: pt stands briely without reliance of UE support                             Pertinent Vitals/Pain Pain Assessment: Faces Faces Pain Scale: Hurts little more Pain Location: headache Pain Descriptors / Indicators: Other (Comment) (headache) Pain Intervention(s): Monitored during session;Limited activity within patient's tolerance    Home Living Family/patient expects to be discharged to:: Private residence Living Arrangements: Alone Available Help at Discharge: Family;Available PRN/intermittently Type of Home: Mobile home Home Access: Stairs to enter Entrance Stairs-Rails: Left Entrance Stairs-Number of Steps: 3 Home Layout: One level Home Equipment: Walker - 4 wheels;Cane - single point      Prior Function Level of Independence: Independent         Comments: Pt reports typically ambulating without device, holding onto walls in the home. Pt uses cane at times when pain is exacerbated.     Hand Dominance  Extremity/Trunk Assessment   Upper Extremity Assessment Upper Extremity Assessment: Overall WFL for tasks assessed    Lower Extremity Assessment Lower Extremity Assessment: Generalized weakness    Cervical / Trunk Assessment Cervical / Trunk Assessment: Normal   Communication   Communication: No difficulties  Cognition Arousal/Alertness: Awake/alert Behavior During Therapy: WFL for tasks assessed/performed Overall Cognitive Status: Within Functional Limits for tasks assessed                                        General Comments General comments (skin integrity, edema, etc.): VSS on 3 LPM.    Exercises     Assessment/Plan    PT Assessment Patient needs continued PT services  PT Problem List Decreased strength;Decreased activity tolerance;Decreased balance;Decreased mobility;Decreased knowledge of use of DME;Decreased knowledge of precautions;Pain       PT Treatment Interventions DME instruction;Gait training;Stair training;Functional mobility training;Therapeutic activities;Therapeutic exercise;Balance training;Patient/family education    PT Goals (Current goals can be found in the Care Plan section)  Acute Rehab PT Goals Patient Stated Goal: get better and go home PT Goal Formulation: With patient Time For Goal Achievement: 02/14/21 Potential to Achieve Goals: Good    Frequency Min 3X/week   Barriers to discharge        Co-evaluation               AM-PAC PT "6 Clicks" Mobility  Outcome Measure Help needed turning from your back to your side while in a flat bed without using bedrails?: None Help needed moving from lying on your back to sitting on the side of a flat bed without using bedrails?: None Help needed moving to and from a bed to a chair (including a wheelchair)?: A Little Help needed standing up from a chair using your arms (e.g., wheelchair or bedside chair)?: A Little Help needed to walk in hospital room?: A Little Help needed climbing 3-5 steps with a railing? : A Lot 6 Click Score: 19    End of Session Equipment Utilized During Treatment: Gait belt;Oxygen Activity Tolerance: Patient limited by fatigue Patient left: in chair;with call bell/phone within reach;with chair alarm set Nurse  Communication: Mobility status PT Visit Diagnosis: Other abnormalities of gait and mobility (R26.89);Muscle weakness (generalized) (M62.81);Unsteadiness on feet (R26.81);Pain Pain - Right/Left:  (headache) Pain - part of body:  (headache)    Time: 7121-9758 PT Time Calculation (min) (ACUTE ONLY): 40 min   Charges:   PT Evaluation $PT Eval Low Complexity: 1 Low          Acute Rehab  Pager: [phone]   Garwin Brothers, SPT  01/31/2021, 12:43 PM

## 2021-01-31 NOTE — TOC Initial Note (Signed)
Transition of Care [facility]) - Initial/Assessment Note    Patient Details  Name: Katherine [name] MRN: [id number] Date of Birth: March 18, 1956  [facility]) CM/SW Contact:    Zenon Mayo, RN Phone Number: 01/31/2021, 4:17 PM  Clinical Narrative:                 Patient is from home alone, she has home oxygen, walker and a cane, she has a scale and bp cuff.  She tries to eat a low sodium diet, but does consume some can foods. She has no issues with getting medications. TOC will cont to follow for dc needs.  Expected Discharge Plan: Home/Self Care Barriers to Discharge: Continued Medical Work up   Patient Goals and CMS Choice Patient states their goals for this hospitalization and ongoing recovery are:: return home   Choice offered to / list presented to : NA  Expected Discharge Plan and Services Expected Discharge Plan: Home/Self Care   Discharge Planning Services: CM Consult Post Acute Care Choice: NA Living arrangements for the past 2 months: Single Family Home                   DME Agency: NA       HH Arranged: NA          Prior Living Arrangements/Services Living arrangements for the past 2 months: Single Family Home Lives with:: Self Patient language and need for interpreter reviewed:: Yes Do you feel safe going back to the place where you live?: Yes      Need for Family Participation in Patient Care: Yes (Comment) Care giver support system in place?: No (comment) Current home services: DME (walker,cane, oxygen) Criminal Activity/Legal Involvement Pertinent to Current Situation/Hospitalization: No - Comment as needed  Activities of Daily Living Home Assistive Devices/Equipment: Cane (specify quad or straight), Shower chair without back, Oxygen, Dentures (specify type), CBG Meter, Walker (specify type) ADL Screening (condition at time of admission) Patient's cognitive ability adequate to safely complete daily activities?: Yes Is the patient  deaf or have difficulty hearing?: No Does the patient have difficulty seeing, even when wearing glasses/contacts?: No Does the patient have difficulty concentrating, remembering, or making decisions?: No Patient able to express need for assistance with ADLs?: Yes Does the patient have difficulty dressing or bathing?: No Independently performs ADLs?: Yes (appropriate for developmental age) Does the patient have difficulty walking or climbing stairs?: Yes Weakness of Legs: Both Weakness of Arms/Hands: None  Permission Sought/Granted                  Emotional Assessment Appearance:: Appears stated age Attitude/Demeanor/Rapport: Engaged Affect (typically observed): Appropriate Orientation: : Oriented to Self, Oriented to Place, Oriented to  Time, Oriented to Situation Alcohol / Substance Use: Not Applicable Psych Involvement: No (comment)  Admission diagnosis:  Acute on chronic diastolic CHF (congestive heart failure) (HCC) [I50.33] Acute on chronic heart failure, unspecified heart failure type ([place]) [I50.9] Respiratory failure, unspecified chronicity, unspecified whether with hypoxia or hypercapnia ([place]) [J96.90] Chronic obstructive pulmonary disease, unspecified COPD type ([place]) [J44.9] Acute CHF (congestive heart failure) ([place]) [I50.9] Acute on chronic systolic CHF (congestive heart failure) ([place]) [I50.23] Patient Active Problem List   Diagnosis Date Noted   Acute on chronic diastolic CHF (congestive heart failure) ([place]) 01/29/2021   Acute CHF (congestive heart failure) ([place]) 01/29/2021   Acute on chronic systolic CHF (congestive heart failure) ([place]) 01/29/2021   Non-ST elevation (NSTEMI) myocardial infarction [place])    Chronic respiratory failure  with hypoxia ([place]) 01/01/2021   Flu vaccine need 08/13/2020   Need for pneumococcal vaccination 08/13/2020   Visit for screening mammogram 08/13/2020   Candidal skin infection 08/13/2020   GAD (generalized anxiety disorder)  08/13/2020   Chronic bronchitis, mucopurulent ([place]) 08/24/2019   Current chronic use of systemic steroids 07/23/2019   OSA (obstructive sleep apnea)    Severe episode of recurrent major depressive disorder, with psychotic features ([place]) 02/16/2019   Obstructive chronic bronchitis without exacerbation ([place]) 11/03/2018   Long term (current) use of anticoagulants 04/28/2018   Renal transplant, status post 01/28/2018   Primary osteoarthritis of right knee 08/18/2017   Mild aortic stenosis by prior echocardiogram 01/28/2017   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   Deficiency anemia 10/14/2016   Normocytic anemia 10/13/2016   Fibromyalgia 03/30/2016   Other spondylosis with radiculopathy, lumbar region 03/30/2016   Type 2 diabetes mellitus with peripheral neuropathy ([place]) 03/30/2016   Paroxysmal atrial fibrillation ([place]); CHA2DS2VASc score F, HTN, CAD, CVA = 5 06/28/2014   Hypertension associated with diabetes ([place])    PAD (peripheral artery disease) ([place]) 08/17/2013   Stenosis of right carotid artery without infarction 10/08/2012   Dyslipidemia, goal LDL below 70 10/08/2012    Class: Diagnosis of   Mitral annular calcification 10/08/2012   Hypomagnesemia 06/13/2012   Renal transplant disorder 06/12/2012   Chronic allergic rhinitis 04/29/2011   Extrinsic asthma 09/09/2007   GERD 09/09/2007   COUGH, CHRONIC 09/09/2007   Morbid obesity - s/p Lap Band 9/'05 05/07/2004    Class: Diagnosis of   Hx of CABG 09/07/1993    Class: History of   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 07/1993   Coronary artery disease involving native coronary artery of native heart with angina pectoris ([place]) 07/1993   ESRD (end stage renal disease) ([place]) 1991   PCP:  [name], MD Pharmacy:   [id number] - [place], [address] [place] [address] Phone: [phone] Fax: [phone]  [place], [address] [place] [address] Phone: [phone] Fax: [place] [address] Phone: [phone] Fax: [phone]     Social Determinants of Health (SDOH) Interventions    Readmission Risk Interventions Readmission Risk Prevention Plan 01/31/2021 12/28/2019 08/13/2019  Transportation Screening Complete Complete Complete  HRI or Home Care Consult Complete Complete -  Social Work Consult for [place] Planning/Counseling Complete Complete -  Palliative Care Screening Not Applicable Not Applicable -  Medication Review Press photographer) Complete Complete Complete  PCP or Specialist appointment within 3-5 days of discharge - - Complete  HRI or [place] - - Complete  SW Recovery Care/Counseling Consult - - Complete  [place] - - Not Applicable  Some recent data might be hidden

## 2021-01-31 NOTE — Progress Notes (Addendum)
Progress Note  Patient Name: Katherine [name] Date of Encounter: 01/31/2021  [facility] Cardiologist: [name], MD   Subjective   Underwent cath, back in room. No complaints.   Inpatient Medications    Scheduled Meds:  apixaban  5 mg Oral BID   arformoterol  15 mcg Nebulization BID   aspirin EC  81 mg Oral Daily   azaTHIOprine  125 mg Oral Daily   budesonide  2 mL Nebulization BID   calcitRIOL  0.25 mcg Oral Q3 days   cyclobenzaprine  20 mg Oral QHS   dofetilide  250 mcg Oral BID   DULoxetine  60 mg Oral Daily   fluticasone  2 spray Each Nare Daily   insulin aspart  0-20 Units Subcutaneous TID WC   insulin aspart  0-5 Units Subcutaneous QHS   insulin aspart  6 Units Subcutaneous TID WC   insulin glargine  12 Units Subcutaneous Daily   lamoTRIgine  25 mg Oral QHS   LORazepam  1 mg Oral BID   metoprolol tartrate  100 mg Oral BID   montelukast  10 mg Oral QHS   pantoprazole  40 mg Oral Daily   potassium chloride  20 mEq Oral Once   predniSONE  5 mg Oral Daily   rosuvastatin  20 mg Oral Daily   sertraline  100 mg Oral Daily   sodium chloride flush  3 mL Intravenous Q12H   sodium chloride flush  3 mL Intravenous Q12H   Continuous Infusions:  sodium chloride 50 mL/hr at 01/31/21 1219   sodium chloride     PRN Meds: sodium chloride, acetaminophen, albuterol, cyclobenzaprine, hydrALAZINE, labetalol, nitroGLYCERIN, oxyCODONE, promethazine, sodium chloride flush   Vital Signs    Vitals:   01/31/21 1144 01/31/21 1148 01/31/21 1158 01/31/21 1300  BP: 102/60 (!) 93/46  90/71  Pulse: (!) 51 61 (!) 0   Resp: 16 19 (!) 0   Temp:      TempSrc:      SpO2: 100% 100% 92%   Weight:      Height:        Intake/Output Summary (Last 24 hours) at 01/31/2021 1458 Last data filed at 01/31/2021 0850 Gross per 24 hour  Intake 737.32 ml  Output 2500 ml  Net -1762.68 ml   Last 3 Weights 01/31/2021 01/30/2021 01/29/2021  Weight (lbs) 232 lb 233 lb 3.2 oz 215 lb  Weight  (kg) 105.235 kg 105.779 kg 97.523 kg      Telemetry    SB rates in the 67s - Personally Reviewed  ECG    No new tracings  Physical Exam   GEN: No acute distress.   Neck: No JVD Cardiac: RRR, no murmurs, rubs, or gallops.  Respiratory: Clear to auscultation bilaterally. GI: Soft, nontender, non-distended  MS: No edema; No deformity. Right radial site stable. Neuro:  Nonfocal  Psych: Normal affect   Labs    High Sensitivity Troponin:   Recent Labs  Lab 01/29/21 0030 01/29/21 0230 01/29/21 0625 01/29/21 0746  TROPONINIHS 91* 241* 326* 305*      Chemistry Recent Labs  Lab 01/29/21 0030 01/29/21 0152 01/29/21 0625 01/30/21 0332 01/31/21 0143  NA 135   < > 134* 135 136  K 4.2   < > 3.6 4.9 3.3*  CL 98  --  96* 97* 96*  CO2 25  --  26 28 28   GLUCOSE 268*  --  419* 396* 240*  BUN 14  --  14 17 16  CREATININE 0.90  --  0.97 0.98 0.96  CALCIUM 9.6  --  9.2 9.2 8.8*  PROT 7.2  --  6.9  --   --   ALBUMIN 3.4*  --  3.3*  --   --   AST 18  --  17  --   --   ALT 14  --  12  --   --   ALKPHOS 99  --  96  --   --   BILITOT 0.5  --  0.9  --   --   GFRNONAA >60  --  >60 >60 >60  ANIONGAP 12  --  12 10 12    < > = values in this interval not displayed.     Hematology Recent Labs  Lab 01/29/21 0625 01/30/21 0332 01/31/21 0143  WBC 7.4 11.5* 8.4  RBC 3.71* 3.50* 3.46*  HGB 11.3* 10.7* 10.4*  HCT 34.9* 33.0* 33.7*  MCV 94.1 94.3 97.4  MCH 30.5 30.6 30.1  MCHC 32.4 32.4 30.9  RDW 15.3 15.1 15.6*  PLT 221 235 234    BNP Recent Labs  Lab 01/29/21 0030  BNP 352.2*     DDimer No results for input(s): DDIMER in the last 168 hours.   Radiology    CARDIAC CATHETERIZATION  Result Date: 01/31/2021  Ost LAD to Prox LAD lesion is 50% stenosed.  Prox LAD-1 lesion is 100% stenosed.  Prox LAD-2 lesion is 100% stenosed.  Ost RCA to Dist RCA lesion is 100% stenosed.  SVG and is large.  The graft exhibits minimal luminal irregularities.  SVG and is large.  The  graft exhibits minimal luminal irregularities.  LIMA.  Mid Graft to Dist Graft lesion is 40% stenosed.  Ost Cx to Prox Cx lesion is 60% stenosed.  Dist Graft to Insertion lesion is 90% stenosed.  Ost 1st Diag to 1st Diag lesion is 95% stenosed.  1st Mrg lesion is 40% stenosed.  3rd Mrg lesion is 80% stenosed.  Assessment: 1. Severe native CAD with chronic total occlusion of LAD and RCA 2. LCx with new calcified 60% ostial lesion 3. LIMA to LAD not visualized as only access was right radial 4. SVG to RCA 40% 5. SVG to D2 with recurrent high-grade 90% ISR with 95% lesion in D2 after graft insertion Case reviewed with Drs. Burt Knack and Bar Nunn. Plan will be to intensify medical therapy. If fails medical therapy consider flow wire interrogation of ostial LCx and repeat PCI of SVG-D2. Of note, patient has [name] AVF and known occlusion of R common femoral artery. We initially planned left femoral artery access but on u/s the left CFA was occluded with heavily plaque. We thus chose to go right radial as only access site and try to cross over to inject the LIMA but were unsuccessful. LIMA shown to be widely patent on cath in 2015. [name], MD 12:17 PM    Cardiac Studies   Cath: 01/31/21   Ost LAD to Prox LAD lesion is 50% stenosed. Prox LAD-1 lesion is 100% stenosed. Prox LAD-2 lesion is 100% stenosed. Ost RCA to Dist RCA lesion is 100% stenosed. SVG and is large. The graft exhibits minimal luminal irregularities. SVG and is large. The graft exhibits minimal luminal irregularities. LIMA. Mid Graft to Dist Graft lesion is 40% stenosed. Ost Cx to Prox Cx lesion is 60% stenosed. Dist Graft to Insertion lesion is 90% stenosed. Ost 1st Diag to 1st Diag lesion is 95% stenosed. 1st Mrg lesion is 40% stenosed. 3rd  Mrg lesion is 80% stenosed.       Assessment:   1. Severe native CAD with chronic total occlusion of LAD and RCA 2. LCx with new calcified 60% ostial lesion 3. LIMA to LAD not  visualized as only access was right radial 4. SVG to RCA 40% 5. SVG to D2 with recurrent high-grade 90% ISR with 95% lesion in D2 after graft insertion   Case reviewed with Drs. Burt Knack and [place]. Plan will be to intensify medical therapy. If fails medical therapy consider flow wire interrogation of ostial LCx and repeat PCI of SVG-D2.   Of note, patient has [name] AVF and known occlusion of R common femoral artery. We initially planned left femoral artery access but on u/s the left CFA was occluded with heavily plaque. We thus chose to go right radial as only access site and try to cross over to inject the LIMA but were unsuccessful. LIMA shown to be widely patent on cath in 2015.   [name], MD  12:17 PM  Diagnostic Dominance: Right    Patient Profile     65 y.o. female is a 65 y.o. female with a hx of CAD, s/p CABG with PCI to VG-D1, persistent atrial fib on eliquis and tikosyn mainly maintaining SB, HLD, PAD, renal transplant in 1991 who is being seen 01/29/2021 for the evaluation of chest pain at the request of Dr. Loleta Books.  Assessment & Plan    Non-STEMI with history of CAD status post CABG '95, multiple subsequent PCI's: High-sensitivity troponin in the 300 range.  Underwent cardiac catheterization today noted above.  Notable severe native CAD with CTO of LAD and RCA, left circumflex with calcified 60% ostial lesion which is new.  LIMA to the LAD was not visualized via right radial access.  SVG to D2 with recurrent high-grade 90% in-stent restenosis with 95% lesion in D2 after graft insertion.  Case was reviewed with interventional colleagues with plans to intensify medical therapy.  If fails medical therapy then consider FloWire interrogation of the ostial circumflex with repeat PCI of SVG to D2. --Currently on aspirin 81 mg daily, metoprolol has been titrated 200 mg twice daily, Crestor 20 mg daily --We will further increase Crestor to 40 mg daily.  She is somewhat hypotensive  following cath therefore will hold on increasing medication at present.  Consider addition of Imdur tomorrow if BP stable. --Echo pending  Acute on chronic combined heart failure: EF has been 40 to 45% with grade 3 diastolic dysfunction in the past.  BNP was elevated at 352 on admission.  She has been treated with IV Lasix. --Net -4.1 L --LVEDP 28 mmHg, as above BP is soft post cath.  Will defer on additional Lasix at this time.  Paroxysmal atrial fibrillation: Maintaining sinus rhythm on telemetry. --Initially on IV heparin with plans for cath, per discussion with Dr. Alvester Chou will plan to transition back to Eliquis this evening. --On Tikosyn  ESRD s/p renal transplant: Creatinine remained stable at 0.96  Hyperlipidemia: On Crestor 20 mg daily will further titrate to 40 mg     For questions or updates, please contact [place] Please consult [email or web address] for contact info under        Signed, Reino Bellis, NP  01/31/2021, 2:58 PM     Agree with note by Reino Bellis NP-C  Patient admitted with unstable angina.  She has known CAD status post remote CABG.  She underwent cardiac catheterization by Dr. Haroldine Laws revealing diffuse in-stent restenosis within the  distal diagonal branch vein graft as well as high-grade stenosis at the insertion.  She also had a focal calcific plaque at the origin of her circumflex in the 60 to 70% range.  The cath films were reviewed and the consensus was medical therapy.  She will be started back on her Eliquis for her PAF and her oral long-acting nitrate will be added.  She stable for discharge.  We will arrange early follow-up with Dr. Ellyn Hack.  Lorretta Harp, M.D., [place], [facility], [place] [address] [place], [place]  [address]  [phone] 01/31/2021 6:18 PM

## 2021-02-01 LAB — COMPREHENSIVE METABOLIC PANEL
ALT: 16 U/L (ref 0–44)
AST: 25 U/L (ref 15–41)
Albumin: 2.8 g/dL — ABNORMAL LOW (ref 3.5–5.0)
Alkaline Phosphatase: 86 U/L (ref 38–126)
Anion gap: 9 (ref 5–15)
BUN: 13 mg/dL (ref 8–23)
CO2: 26 mmol/L (ref 22–32)
Calcium: 9 mg/dL (ref 8.9–10.3)
Chloride: 102 mmol/L (ref 98–111)
Creatinine, Ser: 0.89 mg/dL (ref 0.44–1.00)
GFR, Estimated: >60 mL/min (ref >60)
Glucose, Bld: 191 mg/dL — ABNORMAL HIGH (ref 70–99)
Potassium: 3.9 mmol/L (ref 3.5–5.1)
Sodium: 137 mmol/L (ref 135–145)
Total Bilirubin: 0.6 mg/dL (ref 0.3–1.2)
Total Protein: 6.3 g/dL — ABNORMAL LOW (ref 6.5–8.1)

## 2021-02-01 LAB — CBC
HCT: 35 % — ABNORMAL LOW (ref 36.0–46.0)
Hemoglobin: 11 g/dL — ABNORMAL LOW (ref 12.0–15.0)
MCH: 30.6 pg (ref 26.0–34.0)
MCHC: 31.4 g/dL (ref 30.0–36.0)
MCV: 97.5 fL (ref 80.0–100.0)
Platelets: 210 10*3/uL (ref 150–400)
RBC: 3.59 MIL/uL — ABNORMAL LOW (ref 3.87–5.11)
RDW: 15.6 % — ABNORMAL HIGH (ref 11.5–15.5)
WBC: 6.5 10*3/uL (ref 4.0–10.5)
nRBC: 0 % (ref 0.0–0.2)

## 2021-02-01 LAB — ECHOCARDIOGRAM COMPLETE
AR max vel: 1.49 cm2
AV Area VTI: 1.61 cm2
AV Area mean vel: 1.65 cm2
AV Mean grad: 9.5 mmHg
AV Peak grad: 20.5 mmHg
Ao pk vel: 2.27 m/s
Area-P 1/2: 4.05 cm2
Height: 61 in
Weight: 3731.2 oz

## 2021-02-01 LAB — GLUCOSE, CAPILLARY
Glucose-Capillary: 234 mg/dL — ABNORMAL HIGH (ref 70–99)
Glucose-Capillary: 223 mg/dL — ABNORMAL HIGH (ref 70–99)
Glucose-Capillary: 220 mg/dL — ABNORMAL HIGH (ref 70–99)
Glucose-Capillary: 134 mg/dL — ABNORMAL HIGH (ref 70–99)

## 2021-02-01 LAB — HEMOGLOBIN A1C
Hgb A1c MFr Bld: 9.1 % — ABNORMAL HIGH (ref 4.8–5.6)
Mean Plasma Glucose: 214 mg/dL

## 2021-02-01 MED ORDER — INSULIN GLARGINE 100 UNIT/ML ~~LOC~~ SOLN
15.0000 [IU] | Freq: Every day | SUBCUTANEOUS | Status: DC
  Administered 2021-02-01 – 2021-02-03 (×3): 15 [IU] via SUBCUTANEOUS
  Filled 2021-02-01 (×3): qty 0.15

## 2021-02-01 MED ORDER — NYSTATIN 100000 UNIT/ML MT SUSP
5.0000 mL | Freq: Four times a day (QID) | OROMUCOSAL | Status: DC
  Administered 2021-02-01 – 2021-02-03 (×7): 500000 [IU] via OROMUCOSAL
  Filled 2021-02-01 (×7): qty 5

## 2021-02-01 MED ORDER — HYDRALAZINE HCL 20 MG/ML IJ SOLN: 10.0000 mg | Freq: Four times a day (QID) | INTRAMUSCULAR | Status: DC | PRN

## 2021-02-01 MED ORDER — POTASSIUM CHLORIDE CRYS ER 20 MEQ PO TBCR
20.0000 meq | EXTENDED_RELEASE_TABLET | Freq: Once | ORAL | Status: AC
  Administered 2021-02-01: 20 meq via ORAL
  Filled 2021-02-01: qty 1

## 2021-02-01 MED ORDER — ISOSORBIDE MONONITRATE ER 60 MG PO TB24
60.0000 mg | ORAL_TABLET | Freq: Every day | ORAL | Status: DC
  Administered 2021-02-01 – 2021-02-03 (×3): 60 mg via ORAL
  Filled 2021-02-01 (×3): qty 1

## 2021-02-01 MED ORDER — PHENOL 1.4 % MT LIQD
1.0000 | OROMUCOSAL | Status: DC | PRN
  Administered 2021-02-01: 1 via OROMUCOSAL
  Filled 2021-02-01: qty 177

## 2021-02-01 MED ORDER — FUROSEMIDE 10 MG/ML IJ SOLN
40.0000 mg | Freq: Two times a day (BID) | INTRAMUSCULAR | Status: DC
  Administered 2021-02-01: 40 mg via INTRAVENOUS
  Filled 2021-02-01 (×2): qty 4

## 2021-02-01 NOTE — Progress Notes (Addendum)
PROGRESS NOTE    Katherine [name]  EAV:[id number] DOB: 1956/02/27 DOA: 01/28/2021 PCP: [name], MD      Brief Narrative:  Katherine [name] is a 65 y.o. 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  In the last few days this is gotten progressively worse to the point it was severe and she noted she was gaining weight and wheezing.  In the ER, fully, bilateral interstitial changes.  EKG showed new left bundle, troponin only low 200s.  She was given IV Lasix and the hospitalist service were asked to evaluate.  --------------------------------------------------------------------------------------------------------------------------------------   Subjective: The patient was seen and examined this morning, remained stable denies any chest pain or shortness of breath. Patient had a cardiac catheterization 7/82/9562, no complication, tolerated procedure well   ----------------------------------------------------------------------------------------------------------------------------------------------  Assessment & Plan:   NSTEMI - Consult cardiology, appreciate recommendations -IV heparin Gtt... D/Ced  -Optimizing medical therapy-initiating Imdur, continuing aspirin, metoprolol, Crestor  Left heart cath 01/31/2021:  Assessment: 1. Severe native CAD with chronic total occlusion of LAD and RCA 2. LCx with new calcified 60% ostial lesion 3. LIMA to LAD not visualized as only access was right radial 4. SVG to RCA 40% 5. SVG to D2 with recurrent high-grade 90% ISR with 95% lesion in D2 after graft insertion   Acute on chronic systolic CHF -Currently stable denies any chest pain or shortness of breath Improved shortness of breath from yesterday EF has been 40 to  45% with grade 3 diastolic dysfunction in the past.  BNP was elevated at 352 on admission  - Continue beta-blocker, Crestor - Strict I/Os, daily weights, telemetry  - Daily monitoring renal function - per Cardiology, appreciate management  Echo: Ejection fraction, by estimation, is 55 to 60%. The  left ventricle has normal function.  Left ventricular endocardial border not optimally defined to evaluate regional wall motion.  There is mild left  ventricular hypertrophy.  Left ventricular diastolic parameters are consistent with Grade I diastolic dysfunction (impaired relaxation).  Elevated left atrial pressure.  Right ventricular systolic function is normal. The right ventricular  size is normal.   3. Left atrial size was moderately dilated.   Monitoring electrolytes potassium, repleting accordingly - Continue furosemide 40 mg IV twice daily - Continue K supplement, down to 20 daily  ESRD- History of kidney transplant 1991 Creatinine stable - Continue Imuran, prednisone  History of rheumatoid arthritis No evidence of flare -stable -Continue Imuran, prednisone  Chronic pain - Continue home oxycodone, Flexeril, Cymbalta -Stable  Paroxysmal atrial fibrillation Rate controlled - Continue dofetilide -Stop diltiazem   - Increase metoprolol dose - Continue apixaban - stable    Type 2 diabetes, with hyperglycemia  On metformin and glimepiride at home only, got Solu-Medrol in the ER, glucoses here extremely high still increased dose this morning, but still extremely high  His sliding scale to maximum - Start mealtime insulin - Increase Lantus after cath  - Hold home glimepiride, metformin   Anemia of chronic disease/rheumatoid arthritis Hemoglobin at baseline, monitor -stable   COPD -Satting 100% on 3 L of oxygen, otherwise stable Has some sputum, similar to baseline, no wheezing, doubt flare. -Continue LABA, LAMA - Continue bronchodilators -Continue Singulair, PPI,  fluticasone  Anxiety -Continue sertraline, Lamictal -Stable  Disposition: Status is: Inpatient   Remains inpatient appropriate because: requires ongoing IV Lasix, diannostic work up for troponin not apporpriate for outpatient work up  [employer]: The patient is from: Home              Anticipated d/c is to: Home              Patient currently is not medically stable to d/c.   Difficult to place patient No     MDM: The below labs and imaging reports reviewed and summarized above.  Medication management as above.    DVT prophylaxis: N/A on heparin apixaban (ELIQUIS) tablet 5 mg  Code Status: FULL Family Communication: none      Objective: Vitals:   02/01/21 0600 02/01/21 0752 02/01/21 0759 02/01/21 0804  BP:    139/63  Pulse:    61  Resp:      Temp:    98.5 F (36.9 C)  TempSrc:      SpO2:  98% 98% 98%  Weight: 105.8 kg     Height:        Intake/Output Summary (Last 24 hours) at 02/01/2021 1036 Last data filed at 02/01/2021 0613 Gross per 24 hour  Intake 681.24 ml  Output 1500 ml  Net -818.76 ml   Filed Weights   01/30/21 0349 01/31/21 0621 02/01/21 0600  Weight: 105.8 kg 105.2 kg 105.8 kg      Physical Exam:   General:  Alert, oriented, cooperative, no distress;   HEENT:  Normocephalic, PERRL, otherwise with in Normal limits   Neuro:  CNII-XII intact. , normal motor and sensation, reflexes intact   Lungs:   Clear to auscultation BL, Respirations unlabored, no wheezes / crackles  Cardio:    S1/S2, RRR, No murmure, No Rubs or Gallops   Abdomen:   Soft, non-tender, bowel sounds active all four quadrants,  no guarding or peritoneal signs.  Muscular skeletal:  Limited exam - in bed, able to move all 4 extremities, Normal strength,  2+ pulses,  symmetric, No pitting edema  Skin:  Dry, warm to touch, negative for any Rashes,  Wounds: Please see nursing documentation            Data Reviewed: I have personally reviewed following labs and imaging  studies:  CBC: Recent Labs  Lab 01/29/21 0030 01/29/21 0152 01/29/21 0625 01/30/21 0332 01/31/21 0143 02/01/21 0400  WBC 8.1  --  7.4 11.5* 8.4 6.5  NEUTROABS 5.2  --  6.9  --   --   --   HGB 11.8* 11.9* 11.3* 10.7* 10.4* 11.0*  HCT 37.5 35.0* 34.9* 33.0* 33.7* 35.0*  MCV 96.6  --  94.1 94.3 97.4 97.5  PLT 222  --  221 235 234 161   Basic Metabolic Panel: Recent Labs  Lab 01/29/21 0030 01/29/21 0152 01/29/21 0625 01/30/21 0332 01/31/21 0143 02/01/21 0748  NA 135 135 134* 135 136 137  K 4.2 4.0 3.6 4.9 3.3* 3.9  CL 98  --  96* 97* 96* 102  CO2 25  --  26 28 28 26   GLUCOSE 268*  --  419* 396* 240* 191*  BUN 14  --  14 17 16 13   CREATININE 0.90  --  0.97 0.98 0.96 0.89  CALCIUM 9.6  --  9.2 9.2 8.8* 9.0  MG 1.8  --   --   --  2.2  --    GFR: Estimated Creatinine Clearance: 72.5 mL/min (by C-G formula based on SCr of  0.89 mg/dL). Liver Function Tests: Recent Labs  Lab 01/29/21 0030 01/29/21 0625 02/01/21 0748  AST 18 17 25   ALT 14 12 16   ALKPHOS 99 96 86  BILITOT 0.5 0.9 0.6  PROT 7.2 6.9 6.3*  ALBUMIN 3.4* 3.3* 2.8*   No results for input(s): LIPASE, AMYLASE in the last 168 hours. No results for input(s): AMMONIA in the last 168 hours. Coagulation Profile: Recent Labs  Lab 01/31/21 0143  INR 1.2   Cardiac Enzymes: No results for input(s): CKTOTAL, CKMB, CKMBINDEX, TROPONINI in the last 168 hours. BNP (last 3 results) No results for input(s): PROBNP in the last 8760 hours. HbA1C: Recent Labs    01/31/21 0143  HGBA1C 9.1*   CBG: Recent Labs  Lab 01/31/21 0604 01/31/21 1228 01/31/21 1602 01/31/21 2108 02/01/21 0557  GLUCAP 214* 182* 313* 193* 234*   Lipid Profile: No results for input(s): CHOL, HDL, LDLCALC, TRIG, CHOLHDL, LDLDIRECT in the last 72 hours. Thyroid Function Tests: No results for input(s): TSH, T4TOTAL, FREET4, T3FREE, THYROIDAB in the last 72 hours.  Anemia Panel: No results for input(s): VITAMINB12, FOLATE, FERRITIN, TIBC,  IRON, RETICCTPCT in the last 72 hours. Urine analysis:    Component Value Date/Time   COLORURINE YELLOW 10/30/2020 1759   APPEARANCEUR CLEAR 10/30/2020 1759   LABSPEC 1.009 10/30/2020 1759   PHURINE 7.0 10/30/2020 1759   GLUCOSEU 50 (A) 10/30/2020 1759   GLUCOSEU 100 (A) 08/13/2020 1550   HGBUR NEGATIVE 10/30/2020 1759   BILIRUBINUR NEGATIVE 10/30/2020 1759   KETONESUR NEGATIVE 10/30/2020 1759   PROTEINUR NEGATIVE 10/30/2020 1759   UROBILINOGEN 0.2 08/13/2020 1550   NITRITE NEGATIVE 10/30/2020 1759   LEUKOCYTESUR NEGATIVE 10/30/2020 1759   Sepsis Labs: @LABRCNTIP (procalcitonin:4,lacticacidven:4)  ) Recent Results (from the past 240 hour(s))  Resp Panel by RT-PCR (Flu A&B, Covid) Nasopharyngeal Swab     Status: None   Collection Time: 01/29/21  1:06 AM   Specimen: Nasopharyngeal Swab; Nasopharyngeal(NP) swabs in vial transport medium  Result Value Ref Range Status   SARS Coronavirus 2 by RT PCR NEGATIVE NEGATIVE Final    Comment: (NOTE) SARS-CoV-2 target nucleic acids are NOT DETECTED.  The SARS-CoV-2 RNA is generally detectable in upper respiratory specimens during the acute phase of infection. The lowest concentration of SARS-CoV-2 viral copies this assay can detect is 138 copies/mL. A negative result does not preclude SARS-Cov-2 infection and should not be used as the sole basis for treatment or other patient management decisions. A negative result may occur with  improper specimen collection/handling, submission of specimen other than nasopharyngeal swab, presence of viral mutation(s) within the areas targeted by this assay, and inadequate number of viral copies(<138 copies/mL). A negative result must be combined with clinical observations, patient history, and epidemiological information. The expected result is Negative.  Fact Sheet for Patients:  EntrepreneurPulse.com.au  Fact Sheet for Healthcare Providers:   IncredibleEmployment.be  This test is no t yet approved or cleared by the [place] FDA and  has been authorized for detection and/or diagnosis of SARS-CoV-2 by FDA under an Emergency Use Authorization (EUA). This EUA will remain  in effect (meaning this test can be used) for the duration of the COVID-19 declaration under Section 564(b)(1) of the Act, 21 U.S.C.section 360bbb-3(b)(1), unless the authorization is terminated  or revoked sooner.       Influenza A by PCR NEGATIVE NEGATIVE Final   Influenza B by PCR NEGATIVE NEGATIVE Final    Comment: (NOTE) The Xpert Xpress SARS-CoV-2/FLU/RSV plus assay is intended as an  aid in the diagnosis of influenza from Nasopharyngeal swab specimens and should not be used as a sole basis for treatment. Nasal washings and aspirates are unacceptable for Xpert Xpress SARS-CoV-2/FLU/RSV testing.  Fact Sheet for Patients: EntrepreneurPulse.com.au  Fact Sheet for Healthcare Providers: IncredibleEmployment.be  This test is not yet approved or cleared by the [place] FDA and has been authorized for detection and/or diagnosis of SARS-CoV-2 by FDA under an Emergency Use Authorization (EUA). This EUA will remain in effect (meaning this test can be used) for the duration of the COVID-19 declaration under Section 564(b)(1) of the Act, 21 U.S.C. section 360bbb-3(b)(1), unless the authorization is terminated or revoked.  Performed at [facility], [place] [address] [place], [place] [address]          Radiology Studies: CARDIAC CATHETERIZATION  Result Date: 01/31/2021  Ost LAD to Prox LAD lesion is 50% stenosed.  Prox LAD-1 lesion is 100% stenosed.  Prox LAD-2 lesion is 100% stenosed.  Ost RCA to Dist RCA lesion is 100% stenosed.  SVG and is large.  The graft exhibits minimal luminal irregularities.  SVG and is large.  The graft exhibits minimal luminal irregularities.  LIMA.   Mid Graft to Dist Graft lesion is 40% stenosed.  Ost Cx to Prox Cx lesion is 60% stenosed.  Dist Graft to Insertion lesion is 90% stenosed.  Ost 1st Diag to 1st Diag lesion is 95% stenosed.  1st Mrg lesion is 40% stenosed.  3rd Mrg lesion is 80% stenosed.  Assessment: 1. Severe native CAD with chronic total occlusion of LAD and RCA 2. LCx with new calcified 60% ostial lesion 3. LIMA to LAD not visualized as only access was right radial 4. SVG to RCA 40% 5. SVG to D2 with recurrent high-grade 90% ISR with 95% lesion in D2 after graft insertion Case reviewed with Drs. Burt Knack and Morrison. Plan will be to intensify medical therapy. If fails medical therapy consider flow wire interrogation of ostial LCx and repeat PCI of SVG-D2. Of note, patient has [name] AVF and known occlusion of R common femoral artery. We initially planned left femoral artery access but on u/s the left CFA was occluded with heavily plaque. We thus chose to go right radial as only access site and try to cross over to inject the LIMA but were unsuccessful. LIMA shown to be widely patent on cath in 2015. [name], MD 12:17 PM        Scheduled Meds:  apixaban  5 mg Oral BID   arformoterol  15 mcg Nebulization BID   aspirin EC  81 mg Oral Daily   azaTHIOprine  125 mg Oral Daily   budesonide  2 mL Nebulization BID   calcitRIOL  0.25 mcg Oral Q3 days   cyclobenzaprine  20 mg Oral QHS   dofetilide  250 mcg Oral BID   DULoxetine  60 mg Oral Daily   fluticasone  2 spray Each Nare Daily   furosemide  40 mg Intravenous BID   insulin aspart  0-20 Units Subcutaneous TID WC   insulin aspart  0-5 Units Subcutaneous QHS   insulin aspart  6 Units Subcutaneous TID WC   insulin glargine  15 Units Subcutaneous Daily   isosorbide mononitrate  60 mg Oral Daily   lamoTRIgine  25 mg Oral QHS   LORazepam  1 mg Oral BID   metoprolol tartrate  100 mg Oral BID   montelukast  10 mg Oral QHS   nystatin  5 mL  Mouth/Throat QID   pantoprazole   40 mg Oral Daily   predniSONE  5 mg Oral Daily   rosuvastatin  40 mg Oral Daily   sertraline  100 mg Oral Daily   sodium chloride flush  3 mL Intravenous Q12H   sodium chloride flush  3 mL Intravenous Q12H   Continuous Infusions:  sodium chloride       LOS: 3 days    Time spent: 35 minutes    Deatra James, MD [facility] 02/01/2021, 10:36 AM     Please page though AMION or Epic secure chat:  For password, contact charge nurse

## 2021-02-01 NOTE — Progress Notes (Signed)
Progress Note  Patient Name: Katherine [name] Date of Encounter: 02/01/2021  Primary Cardiologist:   [name], MD   Subjective   She thinks she is a little short of breath compared to previous.  No chest pain.  She did ambulate a little bit in the hallway without chest pain.  Inpatient Medications    Scheduled Meds:  apixaban  5 mg Oral BID   arformoterol  15 mcg Nebulization BID   aspirin EC  81 mg Oral Daily   azaTHIOprine  125 mg Oral Daily   budesonide  2 mL Nebulization BID   calcitRIOL  0.25 mcg Oral Q3 days   cyclobenzaprine  20 mg Oral QHS   dofetilide  250 mcg Oral BID   DULoxetine  60 mg Oral Daily   fluticasone  2 spray Each Nare Daily   insulin aspart  0-20 Units Subcutaneous TID WC   insulin aspart  0-5 Units Subcutaneous QHS   insulin aspart  6 Units Subcutaneous TID WC   insulin glargine  15 Units Subcutaneous Daily   lamoTRIgine  25 mg Oral QHS   LORazepam  1 mg Oral BID   metoprolol tartrate  100 mg Oral BID   montelukast  10 mg Oral QHS   nystatin  5 mL Mouth/Throat QID   pantoprazole  40 mg Oral Daily   predniSONE  5 mg Oral Daily   rosuvastatin  40 mg Oral Daily   sertraline  100 mg Oral Daily   sodium chloride flush  3 mL Intravenous Q12H   sodium chloride flush  3 mL Intravenous Q12H   Continuous Infusions:  sodium chloride     PRN Meds: sodium chloride, acetaminophen, albuterol, cyclobenzaprine, hydrALAZINE, nitroGLYCERIN, oxyCODONE, promethazine, sodium chloride flush   Vital Signs    Vitals:   02/01/21 0600 02/01/21 0752 02/01/21 0759 02/01/21 0804  BP:    139/63  Pulse:    61  Resp:      Temp:    98.5 F (36.9 C)  TempSrc:      SpO2:  98% 98% 98%  Weight: 105.8 kg     Height:        Intake/Output Summary (Last 24 hours) at 02/01/2021 0904 Last data filed at 02/01/2021 0258 Gross per 24 hour  Intake 681.24 ml  Output 1500 ml  Net -818.76 ml   Filed Weights   01/30/21 0349 01/31/21 0621 02/01/21 0600  Weight: 105.8  kg 105.2 kg 105.8 kg    Telemetry    NSR - Personally Reviewed  ECG    NA - Personally Reviewed  Physical Exam   GEN: No acute distress.   Neck: No  JVD Cardiac: RRR, no murmurs, rubs, or gallops.  Respiratory: Bilateral basilar crackles GI: Soft, nontender, non-distended  MS: No edema; No deformity.,  Right radial site without bleeding or bruising.  Absent dorsalis pedis and posterior tibialis pulses Neuro:  Nonfocal  Psych: Normal affect   Labs    Chemistry Recent Labs  Lab 01/29/21 0030 01/29/21 0152 01/29/21 0625 01/30/21 0332 01/31/21 0143  NA 135   < > 134* 135 136  K 4.2   < > 3.6 4.9 3.3*  CL 98  --  96* 97* 96*  CO2 25  --  26 28 28   GLUCOSE 268*  --  419* 396* 240*  BUN 14  --  14 17 16   CREATININE 0.90  --  0.97 0.98 0.96  CALCIUM 9.6  --  9.2 9.2 8.8*  PROT 7.2  --  6.9  --   --   ALBUMIN 3.4*  --  3.3*  --   --   AST 18  --  17  --   --   ALT 14  --  12  --   --   ALKPHOS 99  --  96  --   --   BILITOT 0.5  --  0.9  --   --   GFRNONAA >60  --  >60 >60 >60  ANIONGAP 12  --  12 10 12    < > = values in this interval not displayed.     Hematology Recent Labs  Lab 01/30/21 0332 01/31/21 0143 02/01/21 0400  WBC 11.5* 8.4 6.5  RBC 3.50* 3.46* 3.59*  HGB 10.7* 10.4* 11.0*  HCT 33.0* 33.7* 35.0*  MCV 94.3 97.4 97.5  MCH 30.6 30.1 30.6  MCHC 32.4 30.9 31.4  RDW 15.1 15.6* 15.6*  PLT 235 234 210    Cardiac EnzymesNo results for input(s): TROPONINI in the last 168 hours. No results for input(s): TROPIPOC in the last 168 hours.   BNP Recent Labs  Lab 01/29/21 0030  BNP 352.2*     DDimer No results for input(s): DDIMER in the last 168 hours.   Radiology    CARDIAC CATHETERIZATION  Result Date: 01/31/2021  Ost LAD to Prox LAD lesion is 50% stenosed.  Prox LAD-1 lesion is 100% stenosed.  Prox LAD-2 lesion is 100% stenosed.  Ost RCA to Dist RCA lesion is 100% stenosed.  SVG and is large.  The graft exhibits minimal luminal  irregularities.  SVG and is large.  The graft exhibits minimal luminal irregularities.  LIMA.  Mid Graft to Dist Graft lesion is 40% stenosed.  Ost Cx to Prox Cx lesion is 60% stenosed.  Dist Graft to Insertion lesion is 90% stenosed.  Ost 1st Diag to 1st Diag lesion is 95% stenosed.  1st Mrg lesion is 40% stenosed.  3rd Mrg lesion is 80% stenosed.  Assessment: 1. Severe native CAD with chronic total occlusion of LAD and RCA 2. LCx with new calcified 60% ostial lesion 3. LIMA to LAD not visualized as only access was right radial 4. SVG to RCA 40% 5. SVG to D2 with recurrent high-grade 90% ISR with 95% lesion in D2 after graft insertion Case reviewed with Drs. Burt Knack and Donovan. Plan will be to intensify medical therapy. If fails medical therapy consider flow wire interrogation of ostial LCx and repeat PCI of SVG-D2. Of note, patient has [name] AVF and known occlusion of R common femoral artery. We initially planned left femoral artery access but on u/s the left CFA was occluded with heavily plaque. We thus chose to go right radial as only access site and try to cross over to inject the LIMA but were unsuccessful. LIMA shown to be widely patent on cath in 2015. [name], MD 12:17 PM    Cardiac Studies   Cath: 01/31/21     Ost LAD to Prox LAD lesion is 50% stenosed. Prox LAD-1 lesion is 100% stenosed. Prox LAD-2 lesion is 100% stenosed. Ost RCA to Dist RCA lesion is 100% stenosed. SVG and is large. The graft exhibits minimal luminal irregularities. SVG and is large. The graft exhibits minimal luminal irregularities. LIMA. Mid Graft to Dist Graft lesion is 40% stenosed. Ost Cx to Prox Cx lesion is 60% stenosed. Dist Graft to Insertion lesion is 90% stenosed. Ost 1st Diag to 1st Diag lesion is 95% stenosed.  1st Mrg lesion is 40% stenosed. 3rd Mrg lesion is 80% stenosed.       Assessment:   1. Severe native CAD with chronic total occlusion of LAD and RCA 2. LCx with new calcified  60% ostial lesion 3. LIMA to LAD not visualized as only access was right radial 4. SVG to RCA 40% 5. SVG to D2 with recurrent high-grade 90% ISR with 95% lesion in D2 after graft insertion    Of note, patient has [name] AVF and known occlusion of R common femoral artery. We initially planned left femoral artery access but on u/s the left CFA was occluded with heavily plaque. We thus chose to go right radial as only access site and try to cross over to inject the LIMA but were unsuccessful. LIMA shown to be widely patent on cath in 2015.    Diagnostic Dominance: Right     Patient Profile     64 y.o. female with a hx of CAD, s/p CABG with PCI to VG-D1, persistent atrial fib on eliquis and tikosyn mainly maintaining SB, HLD, PAD, renal transplant in 1991 who is being seen 01/29/2021 for the evaluation of chest pain at the request of Dr. Loleta Books.  Assessment & Plan    NSTEMI:     The plan will be to intensify medical therapy. If she  fails medical therapy consider flow wire interrogation of ostial LCx and repeat PCI of SVG-D2.  BP is up and will allow initiation of Imdur.  I reviewed at length with her the anatomy and cath results and she understands the plan.    PVD:   [name] AVF and known occlusion of R common femoral artery.  She has groin pain but not classic claudication.  I will suggest VVS follow up which can be arranged as an outpatient.  ACUTE ON CHRONIC COMBINED SYSTOLIC AND DIASTOLIC HF: He does have some bilateral basilar crackles.  I suspect she has a little excess volume.  Her echo is pending results and I asked that this reading be expedited.  I am going to resume Lasix and give her 40 mg twice daily of IV today.  She did not have a p.m. dose yesterday.  She should probably resume heel in the morning.  ATRIAL FIB: Eliquis has been resumed.  RENAL TRANSPLANT:    Creat is stable.   For questions or updates, please contact [place] Please consult [email or web address] for contact info  under Cardiology/STEMI.   Signed, Minus Breeding, MD  02/01/2021, 9:04 AM

## 2021-02-02 ENCOUNTER — Other Ambulatory Visit: Payer: Self-pay

## 2021-02-02 LAB — CBC
HCT: 36.3 % (ref 36.0–46.0)
Hemoglobin: 11.6 g/dL — ABNORMAL LOW (ref 12.0–15.0)
MCH: 30.9 pg (ref 26.0–34.0)
MCHC: 32 g/dL (ref 30.0–36.0)
MCV: 96.5 fL (ref 80.0–100.0)
Platelets: 223 10*3/uL (ref 150–400)
RBC: 3.76 MIL/uL — ABNORMAL LOW (ref 3.87–5.11)
RDW: 15.3 % (ref 11.5–15.5)
WBC: 6.4 10*3/uL (ref 4.0–10.5)
nRBC: 0 % (ref 0.0–0.2)

## 2021-02-02 LAB — GLUCOSE, CAPILLARY
Glucose-Capillary: 160 mg/dL — ABNORMAL HIGH (ref 70–99)
Glucose-Capillary: 372 mg/dL — ABNORMAL HIGH (ref 70–99)
Glucose-Capillary: 232 mg/dL — ABNORMAL HIGH (ref 70–99)
Glucose-Capillary: 166 mg/dL — ABNORMAL HIGH (ref 70–99)

## 2021-02-02 LAB — BASIC METABOLIC PANEL
Anion gap: 11 (ref 5–15)
BUN: 11 mg/dL (ref 8–23)
CO2: 28 mmol/L (ref 22–32)
Calcium: 9.4 mg/dL (ref 8.9–10.3)
Chloride: 100 mmol/L (ref 98–111)
Creatinine, Ser: 0.87 mg/dL (ref 0.44–1.00)
GFR, Estimated: >60 mL/min (ref >60)
Glucose, Bld: 128 mg/dL — ABNORMAL HIGH (ref 70–99)
Potassium: 3.8 mmol/L (ref 3.5–5.1)
Sodium: 139 mmol/L (ref 135–145)

## 2021-02-02 LAB — GROUP A STREP BY PCR: Group A Strep by PCR: NOT DETECTED

## 2021-02-02 MED ORDER — POTASSIUM CHLORIDE CRYS ER 20 MEQ PO TBCR
30.0000 meq | EXTENDED_RELEASE_TABLET | Freq: Once | ORAL | Status: AC
  Administered 2021-02-02: 30 meq via ORAL
  Filled 2021-02-02: qty 1

## 2021-02-02 MED ORDER — FUROSEMIDE 40 MG PO TABS
40.0000 mg | ORAL_TABLET | Freq: Every day | ORAL | Status: DC
  Administered 2021-02-02 – 2021-02-03 (×2): 40 mg via ORAL
  Filled 2021-02-02 (×2): qty 1

## 2021-02-02 NOTE — Progress Notes (Signed)
Progress Note  Patient Name: Katherine [name] Date of Encounter: 02/02/2021  Primary Cardiologist:   [name], MD   Subjective   She is complaining mostly of a sore throat.  Mild chest pain.  Breathing OK.    Inpatient Medications    Scheduled Meds:  apixaban  5 mg Oral BID   arformoterol  15 mcg Nebulization BID   aspirin EC  81 mg Oral Daily   azaTHIOprine  125 mg Oral Daily   budesonide  2 mL Nebulization BID   calcitRIOL  0.25 mcg Oral Q3 days   cyclobenzaprine  20 mg Oral QHS   dofetilide  250 mcg Oral BID   DULoxetine  60 mg Oral Daily   fluticasone  2 spray Each Nare Daily   furosemide  40 mg Intravenous BID   insulin aspart  0-20 Units Subcutaneous TID WC   insulin aspart  0-5 Units Subcutaneous QHS   insulin aspart  6 Units Subcutaneous TID WC   insulin glargine  15 Units Subcutaneous Daily   isosorbide mononitrate  60 mg Oral Daily   lamoTRIgine  25 mg Oral QHS   LORazepam  1 mg Oral BID   metoprolol tartrate  100 mg Oral BID   montelukast  10 mg Oral QHS   nystatin  5 mL Mouth/Throat QID   pantoprazole  40 mg Oral Daily   predniSONE  5 mg Oral Daily   rosuvastatin  40 mg Oral Daily   sertraline  100 mg Oral Daily   sodium chloride flush  3 mL Intravenous Q12H   sodium chloride flush  3 mL Intravenous Q12H   Continuous Infusions:  sodium chloride     PRN Meds: sodium chloride, acetaminophen, albuterol, cyclobenzaprine, hydrALAZINE, nitroGLYCERIN, oxyCODONE, phenol, promethazine, sodium chloride flush   Vital Signs    Vitals:   02/02/21 0401 02/02/21 0556 02/02/21 0746 02/02/21 0748  BP: 140/73     Pulse: (!) 54     Resp: 18     Temp: 98.4 F (36.9 C)     TempSrc: Oral     SpO2: 98%  98% 98%  Weight:  103.7 kg    Height:        Intake/Output Summary (Last 24 hours) at 02/02/2021 1128 Last data filed at 02/02/2021 0413 Gross per 24 hour  Intake 720 ml  Output 3150 ml  Net -2430 ml   Filed Weights   01/31/21 0621 02/01/21 0600  02/02/21 0556  Weight: 105.2 kg 105.8 kg 103.7 kg    Telemetry    NSR, PACs - Personally Reviewed  ECG    NA - Personally Reviewed  Physical Exam   GEN: No  acute distress.   Neck: No  JVD Cardiac: RRR, no murmurs, rubs, or gallops.  Respiratory: Clear  to auscultation bilaterally. GI: Soft, nontender, non-distended, normal bowel sounds  MS:  No edema; No deformity. Neuro:   Nonfocal  Psych: Oriented and appropriate   Labs    Chemistry Recent Labs  Lab 01/29/21 0030 01/29/21 0152 01/29/21 5427 01/30/21 0332 01/31/21 0143 02/01/21 0748 02/02/21 0326  NA 135   < > 134*   < > 136 137 139  K 4.2   < > 3.6   < > 3.3* 3.9 3.8  CL 98  --  96*   < > 96* 102 100  CO2 25  --  26   < > 28 26 28   GLUCOSE 268*  --  419*   < >  240* 191* 128*  BUN 14  --  14   < > 16 13 11   CREATININE 0.90  --  0.97   < > 0.96 0.89 0.87  CALCIUM 9.6  --  9.2   < > 8.8* 9.0 9.4  PROT 7.2  --  6.9  --   --  6.3*  --   ALBUMIN 3.4*  --  3.3*  --   --  2.8*  --   AST 18  --  17  --   --  25  --   ALT 14  --  12  --   --  16  --   ALKPHOS 99  --  96  --   --  86  --   BILITOT 0.5  --  0.9  --   --  0.6  --   GFRNONAA >60  --  >60   < > >60 >60 >60  ANIONGAP 12  --  12   < > 12 9 11    < > = values in this interval not displayed.     Hematology Recent Labs  Lab 01/31/21 0143 02/01/21 0400 02/02/21 0326  WBC 8.4 6.5 6.4  RBC 3.46* 3.59* 3.76*  HGB 10.4* 11.0* 11.6*  HCT 33.7* 35.0* 36.3  MCV 97.4 97.5 96.5  MCH 30.1 30.6 30.9  MCHC 30.9 31.4 32.0  RDW 15.6* 15.6* 15.3  PLT 234 210 223    Cardiac EnzymesNo results for input(s): TROPONINI in the last 168 hours. No results for input(s): TROPIPOC in the last 168 hours.   BNP Recent Labs  Lab 01/29/21 0030  BNP 352.2*     DDimer No results for input(s): DDIMER in the last 168 hours.   Radiology    No results found.  Cardiac Studies   Cath: 01/31/21     Ost LAD to Prox LAD lesion is 50% stenosed. Prox LAD-1 lesion is  100% stenosed. Prox LAD-2 lesion is 100% stenosed. Ost RCA to Dist RCA lesion is 100% stenosed. SVG and is large. The graft exhibits minimal luminal irregularities. SVG and is large. The graft exhibits minimal luminal irregularities. LIMA. Mid Graft to Dist Graft lesion is 40% stenosed. Ost Cx to Prox Cx lesion is 60% stenosed. Dist Graft to Insertion lesion is 90% stenosed. Ost 1st Diag to 1st Diag lesion is 95% stenosed. 1st Mrg lesion is 40% stenosed. 3rd Mrg lesion is 80% stenosed.       Assessment:   1. Severe native CAD with chronic total occlusion of LAD and RCA 2. LCx with new calcified 60% ostial lesion 3. LIMA to LAD not visualized as only access was right radial 4. SVG to RCA 40% 5. SVG to D2 with recurrent high-grade 90% ISR with 95% lesion in D2 after graft insertion    Of note, patient has [name] AVF and known occlusion of R common femoral artery. We initially planned left femoral artery access but on u/s the left CFA was occluded with heavily plaque. We thus chose to go right radial as only access site and try to cross over to inject the LIMA but were unsuccessful. LIMA shown to be widely patent on cath in 2015.    Diagnostic Dominance: Right      Echo  1. Left ventricular ejection fraction, by estimation, is 55 to 60%. The  left ventricle has normal function. Left ventricular endocardial border  not optimally defined to evaluate regional wall motion. There is mild left  ventricular hypertrophy. Left  ventricular diastolic parameters are consistent with Grade I diastolic  dysfunction (impaired relaxation). Elevated left atrial pressure.   2. Right ventricular systolic function is normal. The right ventricular  size is normal.   3. Left atrial size was moderately dilated.   4. The mitral valve is abnormal. Mild mitral valve regurgitation. Mild  mitral stenosis.   5. The aortic valve has an indeterminant number of cusps. There is mild  calcification of the  aortic valve. There is mild thickening of the aortic  valve. Aortic valve regurgitation is not visualized. Mild aortic valve  stenosis.   Patient Profile     65 y.o. female with a hx of CAD, s/p CABG with PCI to VG-D1, persistent atrial fib on eliquis and tikosyn mainly maintaining SB, HLD, PAD, renal transplant in 1991 who is being seen 01/29/2021 for the evaluation of chest pain at the request of Dr. Loleta Books.  Assessment & Plan    NSTEMI:     The plan will be to intensify medical therapy. If she  fails medical therapy consider flow wire interrogation of ostial LCx and repeat PCI of SVG-D2.  Imdur started.  Some chest pain but she does not think if feels like prior cardiac pain.     PVD:    I will suggest VVS follow up which can be arranged as an outpatient.  ACUTE ON CHRONIC COMBINED SYSTOLIC AND DIASTOLIC HF:    Resumed Lasix yesterday.  Net negative 2.2 liters yesterday.   Switch back to PO today at home dose.     ATRIAL FIB: Eliquis has been resumed.   In NSR with PACs   RENAL TRANSPLANT:    Creat is normal.   For questions or updates, please contact [place] Please consult [email or web address] for contact info under Cardiology/STEMI.   Signed, Minus Breeding, MD  02/02/2021, 11:28 AM

## 2021-02-02 NOTE — Progress Notes (Signed)
PROGRESS NOTE    Katherine [name]  GGY:[id number] DOB: [birth year] DOA: 01/28/2021 PCP: [name], MD      Brief Narrative:  [name] is a 65 y.o. 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  In the last few days this is gotten progressively worse to the point it was severe and she noted she was gaining weight and wheezing.  In the ER, fully, bilateral interstitial changes.  EKG showed new left bundle, troponin only low 200s.  She was given IV Lasix and the hospitalist service were asked to evaluate.  --------------------------------------------------------------------------------------------------------------------------------------   Subjective: The patient was seen and examined this morning, stable in bed, complaining of mild chest discomfort but denies any shortness of breath. No major issues overnight   Patient had a cardiac catheterization 0/35/0093, no complication, tolerated procedure well   ----------------------------------------------------------------------------------------------------------------------------------------------  Assessment & Plan:   NSTEMI - Complain of mild nonspecific chest pain-without shortness of breath - Consult cardiology, appreciate recommendations -IV heparin Gtt... D/Ced,  -Eliquis was resumed -Optimizing medical therapy-initiating Imdur, continuing aspirin, metoprolol, Crestor  Left heart cath 01/31/2021:  Assessment: 1. Severe native CAD with chronic total occlusion of LAD and RCA 2. LCx with new calcified 60% ostial lesion 3. LIMA to LAD not visualized as only access was right radial 4. SVG to RCA 40% 5. SVG to D2 with recurrent high-grade 90% ISR with 95% lesion in D2 after graft insertion   Acute  on chronic systolic CHF -Mild chest pain otherwise stable -Improved shortness of breath EF has been 40 to 45% with grade 3 diastolic dysfunction in the past.  BNP was elevated at 352 on admission  - Continue beta-blocker, Crestor - Strict I/Os, daily weights, telemetry  - Daily monitoring renal function - per Cardiology, appreciate management  - Continue furosemide 40 mg IV twice daily>> anticipating to be switched to p.o. today  Echo: Ejection fraction, by estimation, is 55 to 60%. The  left ventricle has normal function.  Left ventricular endocardial border not optimally defined to evaluate regional wall motion.  There is mild left  ventricular hypertrophy.  Left ventricular diastolic parameters are consistent with Grade I diastolic dysfunction (impaired relaxation).  Elevated left atrial pressure.  Right ventricular systolic function is normal. The right ventricular  size is normal.   3. Left atrial size was moderately dilated.   Monitoring electrolytes potassium, repleting accordingly   ESRD- History of kidney transplant 1991 Creatinine stable - Continue Imuran, prednisone  History of rheumatoid arthritis -Stable -Continue Imuran, prednisone  Chronic pain - Continue home oxycodone, Flexeril, Cymbalta -Stable  Paroxysmal atrial fibrillation Rate controlled - Continue dofetilide -Stop diltiazem   - Increase metoprolol dose - Resuming Apixaban -Remained stable   Type 2 diabetes, with hyperglycemia  On metformin and glimepiride at home only, got Solu-Medrol in the ER, glucoses here extremely high still increased dose this morning, but still extremely high  His sliding scale to maximum - Start mealtime insulin - Increase Lantus  - Hold home glimepiride, metformin   Anemia of chronic disease/rheumatoid arthritis Hemoglobin at baseline, monitor, stable  COPD -Satting 100% on 3 L of oxygen, otherwise stable Has some sputum, similar to baseline, no wheezing, doubt  flare. -Continue LABA, LAMA - Continue bronchodilators -Continue Singulair, PPI, fluticasone  Anxiety -Continue sertraline, Lamictal -Stable     Disposition: Status is: Inpatient   Remains inpatient appropriate because: requires ongoing IV Lasix, diannostic work up for troponin not apporpriate for outpatient work up  [employer]: The patient is from: Home              Anticipated d/c is to: Home withb ? HH-  Pending PT OT evaluation, pending cardiology clearance              Patient currently is not medically stable to d/c.   Difficult to place patient No     MDM: The below labs and imaging reports reviewed and summarized above.  Medication management as above.    DVT prophylaxis: N/A on heparin apixaban (ELIQUIS) tablet 5 mg  Code Status: FULL Family Communication: none      Objective: Vitals:   02/02/21 0556 02/02/21 0746 02/02/21 0748 02/02/21 1201  BP:    (!) 116/55  Pulse:    (!) 55  Resp:    18  Temp:    98 F (36.7 C)  TempSrc:    Oral  SpO2:  98% 98% 95%  Weight: 103.7 kg     Height:        Intake/Output Summary (Last 24 hours) at 02/02/2021 1259 Last data filed at 02/02/2021 0413 Gross per 24 hour  Intake 720 ml  Output 3150 ml  Net -2430 ml   Filed Weights   01/31/21 0621 02/01/21 0600 02/02/21 0556  Weight: 105.2 kg 105.8 kg 103.7 kg       Physical Exam:   General:  Alert, oriented, cooperative, no distress;   HEENT:  Normocephalic, PERRL, otherwise with in Normal limits   Neuro:  CNII-XII intact. , normal motor and sensation, reflexes intact   Lungs:   Clear to auscultation BL, Respirations unlabored, no wheezes / crackles  Cardio:    S1/S2, RRR, No murmure, No Rubs or Gallops   Abdomen:   Soft, non-tender, bowel sounds active all four quadrants,  no guarding or peritoneal signs.  Muscular skeletal:  Limited exam - in bed, able to move all 4 extremities, Normal strength,  2+ pulses,  symmetric, No pitting edema  Skin:  Dry, warm to touch,  negative for any Rashes,  Wounds: Please see nursing documentation         Data Reviewed: I have personally reviewed following labs and imaging studies:  CBC: Recent Labs  Lab 01/29/21 0030 01/29/21 0152 01/29/21 0625 01/30/21 0332 01/31/21 0143 02/01/21 0400 02/02/21 0326  WBC 8.1  --  7.4 11.5* 8.4 6.5 6.4  NEUTROABS 5.2  --  6.9  --   --   --   --   HGB 11.8*   < > 11.3* 10.7* 10.4* 11.0* 11.6*  HCT 37.5   < > 34.9* 33.0* 33.7* 35.0* 36.3  MCV 96.6  --  94.1 94.3 97.4 97.5 96.5  PLT 222  --  221 235 234 210 223   < > = values in this interval not displayed.   Basic Metabolic Panel: Recent Labs  Lab 01/29/21 0030 01/29/21 0152 01/29/21 0625 01/30/21 0332 01/31/21 0143 02/01/21 0748 02/02/21 0326  NA 135   < > 134* 135 136 137 139  K 4.2   < > 3.6 4.9 3.3* 3.9 3.8  CL 98  --  96* 97* 96* 102 100  CO2 25  --  26 28 28 26 28   GLUCOSE 268*  --  419* 396* 240* 191* 128*  BUN 14  --  14 17 16 13 11   CREATININE 0.90  --  0.97 0.98 0.96 0.89 0.87  CALCIUM 9.6  --  9.2 9.2 8.8* 9.0 9.4  MG 1.8  --   --   --  2.2  --   --    < > = values in this interval not displayed.   GFR: Estimated Creatinine Clearance: 73.3 mL/min (by C-G formula based on SCr of 0.87 mg/dL). Liver Function Tests: Recent Labs  Lab 01/29/21 0030 01/29/21 0625 02/01/21 0748  AST 18 17 25   ALT 14 12 16   ALKPHOS 99 96 86  BILITOT 0.5 0.9 0.6  PROT 7.2 6.9 6.3*  ALBUMIN 3.4* 3.3* 2.8*   No results for input(s): LIPASE, AMYLASE in the last 168 hours. No results for input(s): AMMONIA in the last 168 hours. Coagulation Profile: Recent Labs  Lab 01/31/21 0143  INR 1.2   Cardiac Enzymes: No results for input(s): CKTOTAL, CKMB, CKMBINDEX, TROPONINI in the last 168 hours. BNP (last 3 results) No results for input(s): PROBNP in the last 8760 hours. HbA1C: Recent Labs    01/31/21 0143  HGBA1C 9.1*   CBG: Recent Labs  Lab 02/01/21 1126 02/01/21 1614 02/01/21 2110 02/02/21 0600  02/02/21 1056  GLUCAP 223* 220* 134* 160* 372*   Lipid Profile: No results for input(s): CHOL, HDL, LDLCALC, TRIG, CHOLHDL, LDLDIRECT in the last 72 hours. Thyroid Function Tests: No results for input(s): TSH, T4TOTAL, FREET4, T3FREE, THYROIDAB in the last 72 hours.  Anemia Panel: No results for input(s): VITAMINB12, FOLATE, FERRITIN, TIBC, IRON, RETICCTPCT in the last 72 hours. Urine analysis:    Component Value Date/Time   COLORURINE YELLOW 10/30/2020 1759   APPEARANCEUR CLEAR 10/30/2020 1759   LABSPEC 1.009 10/30/2020 1759   PHURINE 7.0 10/30/2020 1759   GLUCOSEU 50 (A) 10/30/2020 1759   GLUCOSEU 100 (A) 08/13/2020 1550   HGBUR NEGATIVE 10/30/2020 1759   BILIRUBINUR NEGATIVE 10/30/2020 1759   KETONESUR NEGATIVE 10/30/2020 1759   PROTEINUR NEGATIVE 10/30/2020 1759   UROBILINOGEN 0.2 08/13/2020 1550   NITRITE NEGATIVE 10/30/2020 1759   LEUKOCYTESUR NEGATIVE 10/30/2020 1759   Sepsis Labs: @LABRCNTIP (procalcitonin:4,lacticacidven:4)  ) Recent Results (from the past 240 hour(s))  Resp Panel by RT-PCR (Flu A&B, Covid) Nasopharyngeal Swab     Status: None   Collection Time: 01/29/21  1:06 AM   Specimen: Nasopharyngeal Swab; Nasopharyngeal(NP) swabs in vial transport medium  Result Value Ref Range Status   SARS Coronavirus 2 by RT PCR NEGATIVE NEGATIVE Final    Comment: (NOTE) SARS-CoV-2 target nucleic acids are NOT DETECTED.  The SARS-CoV-2 RNA is generally detectable in upper respiratory specimens during the acute phase of infection. The lowest concentration of SARS-CoV-2 viral copies this assay can detect is 138 copies/mL. A negative result does not preclude SARS-Cov-2 infection and should not be used as the sole basis for treatment or other patient management decisions. A negative result may occur with  improper specimen collection/handling, submission of specimen other than nasopharyngeal swab, presence of viral mutation(s) within the areas targeted by this assay, and  inadequate number of viral copies(<138 copies/mL). A negative result must be combined with clinical observations, patient history, and epidemiological information. The expected result is Negative.  Fact Sheet for Patients:  EntrepreneurPulse.com.au  Fact Sheet for Healthcare Providers:  IncredibleEmployment.be  This test is no t yet approved or cleared by the [place] FDA and  has been authorized for detection and/or diagnosis of SARS-CoV-2 by FDA under an Emergency Use  Authorization (EUA). This EUA will remain  in effect (meaning this test can be used) for the duration of the COVID-19 declaration under Section 564(b)(1) of the Act, 21 U.S.C.section 360bbb-3(b)(1), unless the authorization is terminated  or revoked sooner.       Influenza A by PCR NEGATIVE NEGATIVE Final   Influenza B by PCR NEGATIVE NEGATIVE Final    Comment: (NOTE) The Xpert Xpress SARS-CoV-2/FLU/RSV plus assay is intended as an aid in the diagnosis of influenza from Nasopharyngeal swab specimens and should not be used as a sole basis for treatment. Nasal washings and aspirates are unacceptable for Xpert Xpress SARS-CoV-2/FLU/RSV testing.  Fact Sheet for Patients: EntrepreneurPulse.com.au  Fact Sheet for Healthcare Providers: IncredibleEmployment.be  This test is not yet approved or cleared by the [place] FDA and has been authorized for detection and/or diagnosis of SARS-CoV-2 by FDA under an Emergency Use Authorization (EUA). This EUA will remain in effect (meaning this test can be used) for the duration of the COVID-19 declaration under Section 564(b)(1) of the Act, 21 U.S.C. section 360bbb-3(b)(1), unless the authorization is terminated or revoked.  Performed at [facility], [place] [address], [place], [place] [address]          Radiology Studies: No results found.      Scheduled Meds:  apixaban  5 mg  Oral BID   arformoterol  15 mcg Nebulization BID   aspirin EC  81 mg Oral Daily   azaTHIOprine  125 mg Oral Daily   budesonide  2 mL Nebulization BID   calcitRIOL  0.25 mcg Oral Q3 days   cyclobenzaprine  20 mg Oral QHS   dofetilide  250 mcg Oral BID   DULoxetine  60 mg Oral Daily   fluticasone  2 spray Each Nare Daily   furosemide  40 mg Oral Daily   insulin aspart  0-20 Units Subcutaneous TID WC   insulin aspart  0-5 Units Subcutaneous QHS   insulin aspart  6 Units Subcutaneous TID WC   insulin glargine  15 Units Subcutaneous Daily   isosorbide mononitrate  60 mg Oral Daily   lamoTRIgine  25 mg Oral QHS   LORazepam  1 mg Oral BID   metoprolol tartrate  100 mg Oral BID   montelukast  10 mg Oral QHS   nystatin  5 mL Mouth/Throat QID   pantoprazole  40 mg Oral Daily   predniSONE  5 mg Oral Daily   rosuvastatin  40 mg Oral Daily   sertraline  100 mg Oral Daily   sodium chloride flush  3 mL Intravenous Q12H   sodium chloride flush  3 mL Intravenous Q12H   Continuous Infusions:  sodium chloride       LOS: 4 days    Time spent: 35 minutes    Deatra James, MD [facility] 02/02/2021, 12:59 PM     Please page though AMION or Epic secure chat:  For password, contact charge nurse

## 2021-02-03 ENCOUNTER — Other Ambulatory Visit (HOSPITAL_COMMUNITY): Payer: Self-pay

## 2021-02-03 ENCOUNTER — Ambulatory Visit: Payer: Medicare Other | Admitting: Cardiology

## 2021-02-03 LAB — BASIC METABOLIC PANEL
Anion gap: 9 (ref 5–15)
BUN: 13 mg/dL (ref 8–23)
CO2: 30 mmol/L (ref 22–32)
Calcium: 9.2 mg/dL (ref 8.9–10.3)
Chloride: 99 mmol/L (ref 98–111)
Creatinine, Ser: 0.93 mg/dL (ref 0.44–1.00)
GFR, Estimated: >60 mL/min (ref >60)
Glucose, Bld: 193 mg/dL — ABNORMAL HIGH (ref 70–99)
Potassium: 3.6 mmol/L (ref 3.5–5.1)
Sodium: 138 mmol/L (ref 135–145)

## 2021-02-03 LAB — CBC
HCT: 37.8 % (ref 36.0–46.0)
Hemoglobin: 11.6 g/dL — ABNORMAL LOW (ref 12.0–15.0)
MCH: 29.6 pg (ref 26.0–34.0)
MCHC: 30.7 g/dL (ref 30.0–36.0)
MCV: 96.4 fL (ref 80.0–100.0)
Platelets: 232 10*3/uL (ref 150–400)
RBC: 3.92 MIL/uL (ref 3.87–5.11)
RDW: 15.1 % (ref 11.5–15.5)
WBC: 8 10*3/uL (ref 4.0–10.5)
nRBC: 0.2 % (ref 0.0–0.2)

## 2021-02-03 LAB — MAGNESIUM: Magnesium: 1.9 mg/dL (ref 1.7–2.4)

## 2021-02-03 LAB — GLUCOSE, CAPILLARY
Glucose-Capillary: 198 mg/dL — ABNORMAL HIGH (ref 70–99)
Glucose-Capillary: 310 mg/dL — ABNORMAL HIGH (ref 70–99)

## 2021-02-03 MED ORDER — ISOSORBIDE MONONITRATE ER 60 MG PO TB24
60.0000 mg | ORAL_TABLET | Freq: Every day | ORAL | 3 refills | Status: DC
  Filled 2021-02-03: qty 30, 30d supply, fill #0

## 2021-02-03 MED ORDER — ROSUVASTATIN CALCIUM 40 MG PO TABS
40.0000 mg | ORAL_TABLET | Freq: Every day | ORAL | 3 refills | Status: DC
  Filled 2021-02-03: qty 30, 30d supply, fill #0

## 2021-02-03 MED ORDER — ASPIRIN 81 MG PO TBEC
81.0000 mg | DELAYED_RELEASE_TABLET | Freq: Every day | ORAL | 11 refills | Status: DC
  Filled 2021-02-03: qty 30, 30d supply, fill #0

## 2021-02-03 MED ORDER — CLOTRIMAZOLE 10 MG MT TROC: 10.0000 mg | Freq: Every day | OROMUCOSAL | Status: DC | PRN

## 2021-02-03 MED ORDER — METOPROLOL TARTRATE 100 MG PO TABS
100.0000 mg | ORAL_TABLET | Freq: Two times a day (BID) | ORAL | 3 refills | Status: DC
  Filled 2021-02-03: qty 60, 30d supply, fill #0

## 2021-02-03 NOTE — TOC Transition Note (Addendum)
Transition of Care [facility]) - CM/SW Discharge Note   Patient Details  Name: Katherine [name] MRN: [id number] Date of Birth: 1955/11/02  [facility]) CM/SW Contact:  Zenon Mayo, RN Phone Number: 02/03/2021, 12:05 PM   Clinical Narrative:    Patient is for dc home today, she does not want HHOT.  She has no other needs. THN to follow up with embedded team.   Final next level of care: Home/Self Care Barriers to Discharge: No Barriers Identified   Patient Goals and CMS Choice Patient states their goals for this hospitalization and ongoing recovery are:: return home   Choice offered to / list presented to : NA  Discharge Placement                       Discharge Plan and Services   Discharge Planning Services: CM Consult Post Acute Care Choice: NA            DME Agency: NA       HH Arranged: NA          Social Determinants of Health (SDOH) Interventions     Readmission Risk Interventions Readmission Risk Prevention Plan 01/31/2021 12/28/2019 08/13/2019  Transportation Screening Complete Complete Complete  HRI or Home Care Consult Complete Complete -  Social Work Consult for [place] Planning/Counseling Complete Complete -  Palliative Care Screening Not Applicable Not Applicable -  Medication Review Press photographer) Complete Complete Complete  PCP or Specialist appointment within 3-5 days of discharge - - Complete  HRI or [place] - - Complete  SW Recovery Care/Counseling Consult - - Complete  [place] - - Not Applicable  Some recent data might be hidden

## 2021-02-03 NOTE — Progress Notes (Addendum)
Heart Failure Stewardship Pharmacist Progress Note   PCP: [name], MD PCP-Cardiologist: [name], MD    HPI:  65 yo F w/ PMH of CAD s/p stents, renal transplant, diastolic CHF, COPD, and DM who presented to ED complaining of CP and SOB worsening over the past month. Notes weight gain and wheezing as well. CXR showed concern for fluid overload. Started on IV Lasix and admitted for CHF. ECHO from 06/2020 revealed EF 40-45%. Most recent ECHO on 02/01/21 revealed EF 55-60%.  Current HF Medications: - Furosemide 40mg  PO daily - Metoprolol tartrate 100mg  BID - Imdur 60 mg daily  Prior to admission HF Medications: - Furosemide 40mg  daily - Metoprolol tartrate 50mg  BID  Pertinent Lab Values: Serum creatinine 0.93, BUN 13, Potassium 3.6, Sodium 138, BNP 352, A1c 7.7>9.1, CBGs 310  Vital Signs: Weight: 228 lbs (admission weight: 233 lbs) Blood pressure: 110-140/60s  Heart rate: 50-60s   Medication Assistance / Insurance Benefits Check: Does the patient have prescription insurance?  Yes Type of insurance plan: [place] Medicaid  Outpatient Pharmacy:  Prior to admission outpatient pharmacy: [facility] Is the patient willing to use [facility] at discharge? No  Assessment: 1. Acute on chronic diastolic CHF (EF 69-62%), due to NICM. NYHA class II symptoms. - Diltiazem discontinued - Started Imdur 60mg  daily - Hold off on Entresto optimization at this time until follow up  - Consider adding spironolactone 12.5 mg daily prior to discharge - Hold off on SGLT2i at this time given uncontrolled glucose. Hgb A1c bumped to 9.1 since November (was 7.7).   Plan: 1) Medication changes recommended at this time: - Start spironolactone 12.5 mg daily  2) Patient assistance: - [name] copay $4 - Farxiga copay $4  3)  Education  - Patient has been educated on current HF medications and potential additions to HF medication regimen - Patient verbalizes understanding that over the next few  months, these medication doses may change and more medications may be added to optimize HF regimen - Patient has been educated on basic disease state pathophysiology and goals of therapy - Time spent 15 mins - HF TOC follow up appt made for 6/28  Marya Fossa [place]) [place], PharmD Student

## 2021-02-03 NOTE — Progress Notes (Signed)
Inpatient Diabetes Program Recommendations  AACE/ADA: New Consensus Statement on Inpatient Glycemic Control (2015)  Target Ranges:  Prepandial:   less than 140 mg/dL      Peak postprandial:   less than 180 mg/dL (1-2 hours)      Critically ill patients:  140 - 180 mg/dL   Lab Results  Component Value Date   GLUCAP 198 (H) 02/03/2021   HGBA1C 9.1 (H) 01/31/2021    Review of Glycemic Control Results for Katherine [name], Katherine [name] (MRN [id number]) as of 02/03/2021 10:29  Ref. Range 02/02/2021 06:00 02/02/2021 10:56 02/02/2021 16:55 02/02/2021 21:17 02/03/2021 05:23  Glucose-Capillary Latest Ref Range: 70 - 99 mg/dL 160 (H) 372 (H) 232 (H) 166 (H) 198 (H)    Diabetes history: DM 2 Outpatient Diabetes medications:  Basaglar 12 units daily, Novolog 14 units tid with meals, Amaryl 4 mg daily, Metformin 500 mg bid Current orders for Inpatient glycemic control:  Novolog resistant tid with meals and HS Lantus 15 units daily  Novolog 6 units tid meal coverage  PO prednisone 5 mg Daily  Inpatient Diabetes Program Recommendations:   - Increase Novolog meal coverage to 10 units tid  Thanks,  Tama Headings RN, MSN, BC-ADM Inpatient Diabetes Coordinator Team Pager [phone] (8a-5p)

## 2021-02-03 NOTE — Progress Notes (Signed)
Went over discharge instructions with patient. Claiborne Billings MD was at the bedside and was able to answer questions about medication changes. Patient verbalize understanding. Tele monitor and PIV removed. Patient tolerated well. Site is clean, dry, and intact. Patient is currently eating lunch and transport has been called.

## 2021-02-03 NOTE — Discharge Instructions (Signed)

## 2021-02-03 NOTE — Progress Notes (Addendum)
Progress Note  Patient Name: Katherine [name] Date of Encounter: 02/03/2021  [facility] Cardiologist: [name], MD   Subjective   Chronic pain this morning. Planned for PT/OT evaluation prior to discharge. No chest pain.   Inpatient Medications    Scheduled Meds:  apixaban  5 mg Oral BID   arformoterol  15 mcg Nebulization BID   aspirin EC  81 mg Oral Daily   azaTHIOprine  125 mg Oral Daily   budesonide  2 mL Nebulization BID   calcitRIOL  0.25 mcg Oral Q3 days   cyclobenzaprine  20 mg Oral QHS   dofetilide  250 mcg Oral BID   DULoxetine  60 mg Oral Daily   fluticasone  2 spray Each Nare Daily   furosemide  40 mg Oral Daily   insulin aspart  0-20 Units Subcutaneous TID WC   insulin aspart  0-5 Units Subcutaneous QHS   insulin aspart  6 Units Subcutaneous TID WC   insulin glargine  15 Units Subcutaneous Daily   isosorbide mononitrate  60 mg Oral Daily   lamoTRIgine  25 mg Oral QHS   LORazepam  1 mg Oral BID   metoprolol tartrate  100 mg Oral BID   montelukast  10 mg Oral QHS   nystatin  5 mL Mouth/Throat QID   pantoprazole  40 mg Oral Daily   predniSONE  5 mg Oral Daily   rosuvastatin  40 mg Oral Daily   sertraline  100 mg Oral Daily   sodium chloride flush  3 mL Intravenous Q12H   sodium chloride flush  3 mL Intravenous Q12H   Continuous Infusions:  sodium chloride     PRN Meds: sodium chloride, acetaminophen, albuterol, cyclobenzaprine, hydrALAZINE, nitroGLYCERIN, oxyCODONE, phenol, promethazine, sodium chloride flush   Vital Signs    Vitals:   02/03/21 0506 02/03/21 0756 02/03/21 0905 02/03/21 0906  BP: 118/71 (!) 144/65    Pulse: (!) 55 (!) 59    Resp: 18 18    Temp: 98.8 F (37.1 C) 97.6 F (36.4 C)    TempSrc: Oral Oral    SpO2: 97% 97% 96% 96%  Weight: 103.7 kg     Height:        Intake/Output Summary (Last 24 hours) at 02/03/2021 0943 Last data filed at 02/03/2021 0855 Gross per 24 hour  Intake 240 ml  Output 1800 ml  Net -1560  ml   Last 3 Weights 02/03/2021 02/02/2021 02/01/2021  Weight (lbs) 228 lb 9.9 oz 228 lb 9.9 oz 233 lb 3.2 oz  Weight (kg) 103.7 kg 103.7 kg 105.779 kg      Telemetry    SB - Personally Reviewed  ECG    No new tracing.   Physical Exam   GEN: No acute distress.   Neck: No JVD Cardiac: RRR, + systolic 2/6 murmur, no rubs, or gallops.  Respiratory: Clear to auscultation bilaterally. GI: Soft, nontender, non-distended  MS: No edema; No deformity. Right radial cath site stable.  Neuro:  Nonfocal  Psych: Normal affect   Labs    High Sensitivity Troponin:   Recent Labs  Lab 01/29/21 0030 01/29/21 0230 01/29/21 0625 01/29/21 0746  TROPONINIHS 91* 241* 326* 305*      Chemistry Recent Labs  Lab 01/29/21 0030 01/29/21 0152 01/29/21 0625 01/30/21 0332 01/31/21 0143 02/01/21 0748 02/02/21 0326  NA 135   < > 134*   < > 136 137 139  K 4.2   < > 3.6   < >  3.3* 3.9 3.8  CL 98  --  96*   < > 96* 102 100  CO2 25  --  26   < > 28 26 28   GLUCOSE 268*  --  419*   < > 240* 191* 128*  BUN 14  --  14   < > 16 13 11   CREATININE 0.90  --  0.97   < > 0.96 0.89 0.87  CALCIUM 9.6  --  9.2   < > 8.8* 9.0 9.4  PROT 7.2  --  6.9  --   --  6.3*  --   ALBUMIN 3.4*  --  3.3*  --   --  2.8*  --   AST 18  --  17  --   --  25  --   ALT 14  --  12  --   --  16  --   ALKPHOS 99  --  96  --   --  86  --   BILITOT 0.5  --  0.9  --   --  0.6  --   GFRNONAA >60  --  >60   < > >60 >60 >60  ANIONGAP 12  --  12   < > 12 9 11    < > = values in this interval not displayed.     Hematology Recent Labs  Lab 01/31/21 0143 02/01/21 0400 02/02/21 0326  WBC 8.4 6.5 6.4  RBC 3.46* 3.59* 3.76*  HGB 10.4* 11.0* 11.6*  HCT 33.7* 35.0* 36.3  MCV 97.4 97.5 96.5  MCH 30.1 30.6 30.9  MCHC 30.9 31.4 32.0  RDW 15.6* 15.6* 15.3  PLT 234 210 223    BNP Recent Labs  Lab 01/29/21 0030  BNP 352.2*     DDimer No results for input(s): DDIMER in the last 168 hours.   Radiology    No results  found.  Cardiac Studies   Cath: 01/31/21     Ost LAD to Prox LAD lesion is 50% stenosed. Prox LAD-1 lesion is 100% stenosed. Prox LAD-2 lesion is 100% stenosed. Ost RCA to Dist RCA lesion is 100% stenosed. SVG and is large. The graft exhibits minimal luminal irregularities. SVG and is large. The graft exhibits minimal luminal irregularities. LIMA. Mid Graft to Dist Graft lesion is 40% stenosed. Ost Cx to Prox Cx lesion is 60% stenosed. Dist Graft to Insertion lesion is 90% stenosed. Ost 1st Diag to 1st Diag lesion is 95% stenosed. 1st Mrg lesion is 40% stenosed. 3rd Mrg lesion is 80% stenosed.       Assessment:   1. Severe native CAD with chronic total occlusion of LAD and RCA 2. LCx with new calcified 60% ostial lesion 3. LIMA to LAD not visualized as only access was right radial 4. SVG to RCA 40% 5. 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 We initially planned left femoral artery access but on u/s the left CFA was occluded with heavily plaque. We thus chose to go right radial as only access site and try to cross over to inject the LIMA but were unsuccessful. LIMA shown to be widely patent on cath in 2015.     Diagnostic Dominance: Right        Echo: 01/30/21   1. Left ventricular ejection fraction, by estimation, is 55 to 60%. The  left  ventricle has normal function. Left ventricular endocardial border  not optimally defined to evaluate regional wall motion. There is mild left  ventricular hypertrophy. Left  ventricular diastolic parameters are consistent with Grade I diastolic  dysfunction (impaired relaxation). Elevated left atrial pressure.   2. Right ventricular systolic function is normal. The right ventricular  size is normal.   3. Left atrial size was moderately dilated.   4. The mitral valve is abnormal. Mild mitral valve  regurgitation. Mild  mitral stenosis.   5. The aortic valve has an indeterminant number of cusps. There is mild  calcification of the aortic valve. There is mild thickening of the aortic  valve. Aortic valve regurgitation is not visualized. Mild aortic valve  stenosis.  Patient Profile     65 y.o. female with a hx of CAD, s/p CABG with PCI to VG-D1, persistent atrial fib on eliquis and tikosyn mainly maintaining SB, HLD, PAD, renal transplant in 1991 who is being seen 01/29/2021 for the evaluation of chest pain at the request of Dr. Loleta Books.   Assessment & Plan    Non-STEMI with history of CAD status post CABG '95, multiple subsequent PCI's: High-sensitivity troponin in the 300 range.  Underwent cardiac catheterization today noted above.  Notable severe native CAD with CTO of LAD and RCA, left circumflex with calcified 60% ostial lesion which is new.  LIMA to the LAD was not visualized via right radial access.  SVG to D2 with recurrent high-grade 90% in-stent restenosis with 95% lesion in D2 after graft insertion.  Case was reviewed with interventional colleagues with plans to intensify medical therapy.  If fails medical therapy then consider FloWire interrogation of the ostial circumflex with repeat PCI of SVG to D2. --Currently on aspirin 81 mg daily, metoprolol has been titrated 100 mg twice daily, Crestor 40 mg daily, along with Imdur 30g daily   Acute on chronic combined heart failure: EF has been 40 to 45% with grade 3 diastolic dysfunction in the past.  BNP was elevated at 352 on admission.  She has been treated with IV Lasix. Echo this admission, EF 55-60% with G3DD --Net -8.6 L --LVEDP 28 mmHg, on cath, now transitioned back to PO lasix 40mg  daily   Paroxysmal atrial fibrillation: Maintaining sinus rhythm on telemetry. --Initially on IV heparin with plans for cath, has been transitioned back to Eliquis  --On Tikosyn   ESRD s/p renal transplant: Creatinine remained stable at 0.87    Hyperlipidemia: On Crestor 40mg  daily  PVD: outpatient VVS follow up  DM: Hgb A1c 9.1 -- on SSI, needs better glycemic control. She has struggled with her CBGs with steroid treatment for her COPD  COPD: no wheezing on exam -- per primary  Seems she is nearing DC. Will arrange for follow up in the office.     For questions or updates, please contact [place] Please consult [email or web address] for contact info under        Signed, Reino Bellis, NP  02/03/2021, 9:43 AM      Patient seen and examined. Agree with assessment and plan.  Feels well.  No recurrent chest pain.  No shortness of breath.  Increase medical therapy for CAD.  Nitrates added.  Plan for discharge today with office follow-up with Dr. [name].   Troy Sine, MD, [facility] 02/03/2021 12:22 PM

## 2021-02-03 NOTE — Progress Notes (Signed)
   02/03/21 1015  Mobility  Activity Refused mobility (Declined for unspecified reasons)

## 2021-02-03 NOTE — Progress Notes (Signed)
OT Cancellation Note  Patient Details Name: Katherine [name] MRN: [id number] DOB: [birth year]   Cancelled Treatment:    Reason Eval/Treat Not Completed: Patient declined, no reason specified [name] [name]   [name] 02/03/2021, 9:29 AM

## 2021-02-03 NOTE — Discharge Summary (Signed)
Physician Discharge Summary  Katherine [name] TFT:[id number] DOB: [birth year] DOA: 01/28/2021  PCP: Katherine Lima, MD  Admit date: 01/28/2021 Discharge date: 02/03/2021  Admitted From: Home  Disposition:  Home   Recommendations for Outpatient Follow-up:  Follow up with PCP in 1-2 weeks Follow up with Cardiology in 2-4 weeks Dr. Ronnald [name]: Please obtain BMP in 1 week to follow Cr Dr. Ronnald [name]: Please refer to Vascular surgery for evaluation of peripheral vascular disease (see below)     Home Health: None  Equipment/Devices: None new  Discharge Condition: Fair  CODE STATUS: FULL Diet recommendation: Cardiac  Brief/Interim Summary: Katherine [name] is a 65 y.o. F with sCHF EF 40-45% 11/21, asthma/COPD on home o2-3L follows with Dr. Lamonte [name], ESRD s/p DDKT on Imuran/prednisone, pAF on ELiquis, CAD s/p CABG 1995, HTN, RA on Imuran/prednisone, DM and obesity s/p lab band who presented with several weeks of progressive shortness of breath and substernal chest pain.  In the last few days this had gotten progressively worse to the point it was severe and she noted she was gaining weight and wheezing.  In the ER, CXR showed cardiomegaly, bilateral interstitial changes.  EKG showed new left bundle, troponin only low 200s.  She was given IV Lasix and the hospitalist service were asked to evaluate.     PRINCIPAL HOSPITAL DIAGNOSIS: Acute on chronic systolic and diastolic CHF    Discharge Diagnoses:   Acute on chronic systolic and diastolic CHF Admitted and started on IV Lasix.  Diuresed 9.2 L to dry weight 228.  Symptoms improved.  Discharged on prior Lasix 40 mg daily.    Diltiazem stopped in setting of CHF.   NSTEMI Coronary artery disease, secondary prevention Admitted and underwent left heart cath on 6/17, found to have severe native coronary disease with total occlusion of the LAD and RCA, left circumflex was new calcified 60% ostial lesion, and old SVG to D2 graft with recurrent  high-grade restenosis.  Decision was made to treat medically.  Aspirin, Imdur were added.   Metoprolol and Crestor were increased.   Diltiazem was stopped due to CHF.   Peripheral vascular disease The following was commented upon during heart cath: "Of note, patient has [name] AVF and known occlusion of R common femoral artery. We initially planned left femoral artery access but on u/s the left CFA was occluded with heavily plaque. We thus chose to go right radial as only access site and try to cross over to inject the [name] but were unsuccessful. [name] shown to be widely patent on cath in 2015."  Cardiology recommend vascular surgery referral as an ouptaitnet     Paroxysmal atrial fibrillation On Eliquis, Tikosyn, metoprolo, well-controlled.  End-stage renal disease with renal transplant peripheral vascular disease  Diabetes, type 2, with hyperglycemia  Hypokalemia Repleted and resolved.  Rheumatoid arthritis Chronic pain No new or active joint disease  Anemia of chronic disease anemia of chronic disease  COPD No active flare  Anxiety       Discharge Instructions  Discharge Instructions     Diet - low sodium heart healthy   Complete by: As directed    Discharge instructions   Complete by: As directed    From Dr. Loleta [name]: You were admitted with chest pain and feeling out of breath. Here, we found that this was from a flare of heart failure. You underwent a heart cath, that showed several chronic complete blockages.  Since these cannot be easily opened, you were started on a new medicine  Imdur to try to resolve your symptoms.  You should take the new medicine and follow up with Dr. Ellyn [name] soon (in the next few weeks) to make sure you are getting better.  The complete list of changes to your medicines are: STOP diltiazem and don't take this any more INCREASE your metoprolol to 100 mg twice daily (it used to be 50 twice daily) START low dose aspirin 81 mg daily  from now on  START Imdur (isosorbide mononitrate) 60 mg daily  INCREASE your rosuvastatin/Crestor to 40 mg nightly  Go back on your normal Lasix, Eliquis, Tikosyn   Go see Dr. Ronnald [name] in 1-2 weeks  Go see Vascular surgery when you can make an appointment   Increase activity slowly   Complete by: As directed       Allergies as of 02/03/2021       Reactions   Tetracycline Hives   Patient tolerated Doxycycline Dec 2020   Niacin Other (See Comments)   Mouth blisters   Niaspan [niacin Er] Other (See Comments)   Mouth blisters   Sulfa Antibiotics Nausea Only, Other (See Comments)   "Tears up stomach"   Sulfonamide Derivatives Other (See Comments)   Reaction: per patient "tears her stomach up"   Codeine Nausea And Vomiting   Erythromycin Nausea And Vomiting   Hydromorphone Hcl Nausea And Vomiting   Morphine And Related Nausea And Vomiting   Nalbuphine Nausea And Vomiting   Nubain   Sulfasalazine Nausea Only, Other (See Comments)   per patient "tears her stomach up", "Tears up stomach"   Tape Rash, Other (See Comments)   No "plastic" tape," please----cloth tape only        Medication List     STOP taking these medications    diltiazem 120 MG 24 hr capsule Commonly known as: CARDIZEM CD   diltiazem 30 MG tablet Commonly known as: CARDIZEM   doxycycline 100 MG tablet Commonly known as: VIBRA-TABS       TAKE these medications    acetaminophen 500 MG tablet Commonly known as: TYLENOL Take 1,500 mg by mouth daily.   albuterol 108 (90 Base) MCG/ACT inhaler Commonly known as: VENTOLIN HFA Inhale 2 puffs into the lungs every 6 (six) hours as needed for wheezing or shortness of breath. What changed: Another medication with the same name was removed. Continue taking this medication, and follow the directions you see here.   arformoterol 15 MCG/2ML Nebu Commonly known as: BROVANA Take 2 mLs (15 mcg total) by nebulization 2 (two) times daily. What changed:  additional instructions   aspirin 81 MG EC tablet Take 1 tablet (81 mg total) by mouth daily. Swallow whole. Start taking on: February 04, 2021   azaTHIOprine 50 MG tablet Commonly known as: IMURAN Take 125 mg by mouth daily.   B-D ULTRAFINE III SHORT PEN 31G X 8 MM Misc Generic drug: Insulin Pen Needle See admin instructions.   Basaglar KwikPen 100 UNIT/ML Inject 12 Units into the skin daily before breakfast.   budesonide 0.5 MG/2ML nebulizer solution Commonly known as: PULMICORT Take 2 mLs (0.5 mg total) by nebulization 2 (two) times daily. What changed: additional instructions   calcitRIOL 0.25 MCG capsule Commonly known as: ROCALTROL Take 0.25 mcg by mouth every 3 (three) days.   clotrimazole 10 MG troche Commonly known as: MYCELEX Take 1 tablet (10 mg total) by mouth 5 (five) times daily as needed (thrush).   cyclobenzaprine 10 MG tablet Commonly known as: FLEXERIL TAKE 1 TO 2 TABS  BY MOUTH DAILY. TAKE 2 TAB DAILY AT BEDTIME, MAY ALSO TAKE 1 TABLET BY MOUTH AT NOON AS NEEDED FOR MUSCLE SPASMS What changed: See the new instructions.   dofetilide 250 MCG capsule Commonly known as: TIKOSYN Take 1 capsule (250 mcg total) by mouth 2 (two) times daily.   DULoxetine 30 MG capsule Commonly known as: CYMBALTA TAKE 2 CAPSULES (60 MG TOTAL) BY MOUTH EVERY MORNING. What changed: See the new instructions.   Eliquis 5 MG Tabs tablet Generic drug: apixaban TAKE 1 TABLET BY MOUTH TWICE A DAY (TO REPLACE WARFARIN) What changed: See the new instructions.   fluticasone 50 MCG/ACT nasal spray Commonly known as: FLONASE PLACE 2 SPRAYS INTO BOTH NOSTRILS 2 TIMES DAILY What changed:  how much to take how to take this when to take this additional instructions   furosemide 40 MG tablet Commonly known as: LASIX Take 1 tablet (40 mg total) by mouth daily.   glimepiride 4 MG tablet Commonly known as: AMARYL Take 4 mg by mouth daily.   isosorbide mononitrate 60 MG 24 hr  tablet Commonly known as: IMDUR Take 1 tablet (60 mg total) by mouth daily. Start taking on: February 04, 2021   lamoTRIgine 25 MG tablet Commonly known as: LAMICTAL TAKE 1 TABLET BY MOUTH EVERYDAY AT BEDTIME What changed: See the new instructions.   LORazepam 1 MG tablet Commonly known as: ATIVAN Take 1 tablet (1 mg total) by mouth 2 (two) times daily.   metFORMIN 500 MG tablet Commonly known as: GLUCOPHAGE Take 500 mg by mouth 2 (two) times daily.   metoprolol tartrate 100 MG tablet Commonly known as: LOPRESSOR Take 1 tablet (100 mg total) by mouth 2 (two) times daily. Appointment Required For Further Refills [phone] What changed:  medication strength how much to take   montelukast 10 MG tablet Commonly known as: SINGULAIR Take 1 tablet (10 mg total) by mouth at bedtime.   nitroGLYCERIN 0.4 MG SL tablet Commonly known as: NITROSTAT Place 1 tablet (0.4 mg total) under the tongue every 5 (five) minutes as needed for chest pain.   NovoLOG FlexPen 100 UNIT/ML FlexPen Generic drug: insulin aspart Inject 14 Units into the skin 3 (three) times daily with meals.   omeprazole 20 MG capsule Commonly known as: PRILOSEC Take 20 mg by mouth daily.   ONE TOUCH ULTRA 2 w/Device Kit 2 (two) times daily.   OneTouch Delica Plus [id number] Misc daily. as directed   OneTouch Ultra test strip Generic drug: glucose blood USE AS DIRECTED 3 TIMES DAILY TO CHECK BLOOD SUGAR   Oxycodone HCl 10 MG Tabs Take 1 tablet (10 mg total) by mouth 3 (three) times daily as needed. What changed: reasons to take this   OXYGEN Inhale 3 L into the lungs continuous.   predniSONE 5 MG tablet Commonly known as: DELTASONE Take 5 mg by mouth daily.   promethazine 25 MG tablet Commonly known as: PHENERGAN Take 1 tablet (25 mg total) by mouth at bedtime as needed for nausea. For nausea and sleep   rosuvastatin 40 MG tablet Commonly known as: CRESTOR Take 1 tablet (40 mg total) by mouth  daily. What changed:  medication strength how much to take   sertraline 100 MG tablet Commonly known as: ZOLOFT Take 100 mg by mouth daily.        Follow-up Information     Katherine Lima, MD. Go on 02/10/2021.   Specialty: Internal Medicine Why: $RemoveBefor'@11'MQbVtnhdtaxf$ :20 with Dr.John Contact information: [place]  [place] [address] [phone]         [name], MD .   Specialty: Cardiology Contact information: [address] [place] [address] [phone]                Allergies  Allergen Reactions   Tetracycline Hives    Patient tolerated Doxycycline Dec 2020   Niacin Other (See Comments)    Mouth blisters   Niaspan [Niacin Er] Other (See Comments)    Mouth blisters   Sulfa Antibiotics Nausea Only and Other (See Comments)    "Tears up stomach"   Sulfonamide Derivatives Other (See Comments)    Reaction: per patient "tears her stomach up"   Codeine Nausea And Vomiting   Erythromycin Nausea And Vomiting   Hydromorphone Hcl Nausea And Vomiting   Morphine And Related Nausea And Vomiting   Nalbuphine Nausea And Vomiting    Nubain   Sulfasalazine Nausea Only and Other (See Comments)    per patient "tears her stomach up", "Tears up stomach"   Tape Rash and Other (See Comments)    No "plastic" tape," please----cloth tape only    Consultations: Cardiology   Procedures/Studies: CT Head Wo Contrast  Result Date: 01/29/2021 CLINICAL DATA:  Delirium and head trauma. EXAM: CT HEAD WITHOUT CONTRAST TECHNIQUE: Contiguous axial images were obtained from the base of the skull through the vertex without intravenous contrast. COMPARISON:  04/06/2017 FINDINGS: Brain: There is no mass, hemorrhage or extra-axial collection. The size and configuration of the ventricles and extra-axial CSF spaces are normal. There is hypoattenuation of the white matter, most commonly indicating chronic small vessel disease. Vascular: No abnormal hyperdensity of the  major intracranial arteries or dural venous sinuses. No intracranial atherosclerosis. Skull: The visualized skull base, calvarium and extracranial soft tissues are normal. Sinuses/Orbits: No fluid levels or advanced mucosal thickening of the visualized paranasal sinuses. No mastoid or middle ear effusion. The orbits are normal. IMPRESSION: Chronic small vessel disease without acute intracranial abnormality. Electronically Signed   By: Ulyses Jarred M.D.   On: 01/29/2021 02:03   CARDIAC CATHETERIZATION  Result Date: 01/31/2021  Ost LAD to Prox LAD lesion is 50% stenosed.  Prox LAD-1 lesion is 100% stenosed.  Prox LAD-2 lesion is 100% stenosed.  Ost RCA to Dist RCA lesion is 100% stenosed.  SVG and is large.  The graft exhibits minimal luminal irregularities.  SVG and is large.  The graft exhibits minimal luminal irregularities.  [name].  Mid Graft to Dist Graft lesion is 40% stenosed.  Ost Cx to Prox Cx lesion is 60% stenosed.  Dist Graft to Insertion lesion is 90% stenosed.  Ost 1st Diag to 1st Diag lesion is 95% stenosed.  1st Mrg lesion is 40% stenosed.  3rd Mrg lesion is 80% stenosed.  Assessment: 1. Severe native CAD with chronic total occlusion of LAD and RCA 2. LCx with new calcified 60% ostial lesion 3. [name] to LAD not visualized as only access was right radial 4. SVG to RCA 40% 5. SVG to D2 with recurrent high-grade 90% ISR with 95% lesion in D2 after graft insertion Case reviewed with Drs. Burt Knack and [place]. Plan will be to intensify medical therapy. If fails medical therapy consider flow wire interrogation of ostial LCx and repeat PCI of SVG-D2. Of note, patient has [name] AVF and known occlusion of R common femoral artery. We initially planned left femoral artery access but on u/s the left CFA was occluded with heavily plaque. We thus chose to go  right radial as only access site and try to cross over to inject the [name] but were unsuccessful. [name] shown to be widely patent on cath in 2015.  [name], MD 12:17 PM   DG Chest Portable 1 View  Result Date: 01/29/2021 CLINICAL DATA:  Shortness of breath EXAM: PORTABLE CHEST 1 VIEW COMPARISON:  None. FINDINGS: Mild cardiomegaly. Remote median sternotomy. Unchanged mild interstitial opacity. The visualized skeletal structures are unremarkable. IMPRESSION: Cardiomegaly and mild interstitial pulmonary edema. Electronically Signed   By: Ulyses Jarred M.D.   On: 01/29/2021 00:55   ECHOCARDIOGRAM COMPLETE  Result Date: 02/01/2021    ECHOCARDIOGRAM REPORT   Patient Name:   KIRAT MEZQUITA Date of Exam: 01/30/2021 Medical Rec #:  [id number]          Height:       61.0 in Accession #:    [id number]         Weight:       233.2 lb Date of Birth:  Nov 14, 1955          BSA:          2.016 m Patient Age:    45 years           BP:           142/61 mmHg Patient Gender: F                  HR:           78 bpm. Exam Location:  Inpatient Procedure: 2D Echo, Cardiac Doppler, Color Doppler and Intracardiac            Opacification Agent Indications:    CHF-Acute Systolic  History:        Patient has prior history of Echocardiogram examinations, most                 recent 07/12/2020. Previous Myocardial Infarction and CAD, Prior                 CABG, COPD; Risk Factors:Hypertension, Diabetes, Dyslipidemia                 and Former Smoker. GERD. Hx stroke.  Sonographer:    Clayton Lefort RDCS (AE) Referring Phys: [place] Comments: Technically difficult study due to poor echo windows and no subcostal window. IMPRESSIONS  1. Left ventricular ejection fraction, by estimation, is 55 to 60%. The left ventricle has normal function. Left ventricular endocardial border not optimally defined to evaluate regional wall motion. There is mild left ventricular hypertrophy. Left ventricular diastolic parameters are consistent with Grade I diastolic dysfunction (impaired relaxation). Elevated left atrial pressure.  2. Right ventricular systolic function is  normal. The right ventricular size is normal.  3. Left atrial size was moderately dilated.  4. The mitral valve is abnormal. Mild mitral valve regurgitation. Mild mitral stenosis.  5. The aortic valve has an indeterminant number of cusps. There is mild calcification of the aortic valve. There is mild thickening of the aortic valve. Aortic valve regurgitation is not visualized. Mild aortic valve stenosis. FINDINGS  Left Ventricle: Left ventricular ejection fraction, by estimation, is 55 to 60%. The left ventricle has normal function. Left ventricular endocardial border not optimally defined to evaluate regional wall motion. Definity contrast agent was given IV to delineate the left ventricular endocardial borders. The left ventricular internal cavity size was normal in size. There is mild left ventricular hypertrophy. Left ventricular diastolic parameters are consistent with  Grade I diastolic dysfunction (impaired relaxation). Elevated left atrial pressure. Right Ventricle: The right ventricular size is normal. No increase in right ventricular wall thickness. Right ventricular systolic function is normal. Left Atrium: Left atrial size was moderately dilated. Right Atrium: Right atrial size was not well visualized. Pericardium: There is no evidence of pericardial effusion. Mitral Valve: The mitral valve is abnormal. There is mild thickening of the mitral valve leaflet(s). There is mild calcification of the mitral valve leaflet(s). Mild mitral annular calcification. Mild mitral valve regurgitation. Mild mitral valve stenosis. MV peak gradient, 10.8 mmHg. The mean mitral valve gradient is 4.0 mmHg. Tricuspid Valve: The tricuspid valve is normal in structure. Tricuspid valve regurgitation is not demonstrated. No evidence of tricuspid stenosis. Aortic Valve: The aortic valve has an indeterminant number of cusps. There is mild calcification of the aortic valve. There is mild thickening of the aortic valve. There is mild  aortic valve annular calcification. Aortic valve regurgitation is not visualized. Mild aortic stenosis is present. Aortic valve mean gradient measures 9.5 mmHg. Aortic valve peak gradient measures 20.5 mmHg. Aortic valve area, by VTI measures 1.61 cm. Pulmonic Valve: The pulmonic valve was not well visualized. Pulmonic valve regurgitation is not visualized. No evidence of pulmonic stenosis. Aorta: The aortic root is normal in size and structure. Pulmonary Artery: Indeterminant PASP, inadequate TR jet. Venous: The inferior vena cava was not well visualized. IAS/Shunts: The interatrial septum was not well visualized.  LEFT VENTRICLE PLAX 2D LVOT diam:     1.90 cm  Diastology LV SV:         67       LV e' medial:    4.12 cm/s LV SV Index:   33       LV E/e' medial:  38.6 LVOT Area:     2.84 cm LV e' lateral:   8.56 cm/s                         LV E/e' lateral: 18.6  RIGHT VENTRICLE RV Basal diam:  2.90 cm RV S prime:     9.56 cm/s TAPSE (M-mode): 1.6 cm LEFT ATRIUM             Index       RIGHT ATRIUM           Index LA Vol (A2C):   74.5 ml 36.95 ml/m RA Area:     19.00 cm LA Vol (A4C):   76.3 ml 37.84 ml/m RA Volume:   47.90 ml  23.76 ml/m LA Biplane Vol: 75.2 ml 37.30 ml/m  AORTIC VALVE AV Area (Vmax):    1.49 cm AV Area (Vmean):   1.65 cm AV Area (VTI):     1.61 cm AV Vmax:           226.50 cm/s AV Vmean:          142.000 cm/s AV VTI:            0.416 m AV Peak Grad:      20.5 mmHg AV Mean Grad:      9.5 mmHg LVOT Vmax:         119.00 cm/s LVOT Vmean:        82.500 cm/s LVOT VTI:          0.237 m LVOT/AV VTI ratio: 0.57  AORTA Ao Root diam: 2.80 cm MITRAL VALVE MV Area (PHT): 4.05 cm     SHUNTS MV Peak grad:  10.8 mmHg  Systemic VTI:  0.24 m MV Mean grad:  4.0 mmHg     Systemic Diam: 1.90 cm MV Vmax:       1.64 m/s MV Vmean:      87.1 cm/s MV Decel Time: 188 msec MV E velocity: 159.00 cm/s MV A velocity: 73.75 cm/s MV E/A ratio:  2.16 Carlyle Dolly MD Electronically signed by Carlyle Dolly MD  Signature Date/Time: 02/01/2021/2:15:39 PM    Final       Subjective: Chest pain is improved.  No fever, confusion, dyspnea, swelling.  Discharge Exam: Vitals:   02/03/21 0906 02/03/21 0950  BP:  (!) 125/58  Pulse:  67  Resp:    Temp:    SpO2: 96%    Vitals:   02/03/21 0756 02/03/21 0905 02/03/21 0906 02/03/21 0950  BP: (!) 144/65   (!) 125/58  Pulse: (!) 59   67  Resp: 18     Temp: 97.6 F (36.4 C)     TempSrc: Oral     SpO2: 97% 96% 96%   Weight:      Height:        General: t is alert, awake, not in acute distress Cardiovascular: RRR, nl S1-S2, no murmurs appreciated.   No LE edema.   Respiratory: Normal respiratory rate and rhythm.  CTAB without rales or wheezes. Abdominal: Abdomen soft and non-tender.  No distension or HSM.   Neuro/Psych: Strength symmetric in upper and lower extremities.  Judgment and insight appear normal.   The results of significant diagnostics from this hospitalization (including imaging, microbiology, ancillary and laboratory) are listed below for reference.     Microbiology: Recent Results (from the past 240 hour(s))  Resp Panel by RT-PCR (Flu A&B, Covid) Nasopharyngeal Swab     Status: None   Collection Time: 01/29/21  1:06 AM   Specimen: Nasopharyngeal Swab; Nasopharyngeal(NP) swabs in vial transport medium  Result Value Ref Range Status   SARS Coronavirus 2 by RT PCR NEGATIVE NEGATIVE Final    Comment: (NOTE) SARS-CoV-2 target nucleic acids are NOT DETECTED.  The SARS-CoV-2 RNA is generally detectable in upper respiratory specimens during the acute phase of infection. The lowest concentration of SARS-CoV-2 viral copies this assay can detect is 138 copies/mL. A negative result does not preclude SARS-Cov-2 infection and should not be used as the sole basis for treatment or other patient management decisions. A negative result may occur with  improper specimen collection/handling, submission of specimen other than nasopharyngeal  swab, presence of viral mutation(s) within the areas targeted by this assay, and inadequate number of viral copies(<138 copies/mL). A negative result must be combined with clinical observations, patient history, and epidemiological information. The expected result is Negative.  Fact Sheet for Patients:  EntrepreneurPulse.com.au  Fact Sheet for Healthcare Providers:  IncredibleEmployment.be  This test is no t yet approved or cleared by the [place] FDA and  has been authorized for detection and/or diagnosis of SARS-CoV-2 by FDA under an Emergency Use Authorization (EUA). This EUA will remain  in effect (meaning this test can be used) for the duration of the COVID-19 declaration under Section 564(b)(1) of the Act, 21 U.S.C.section 360bbb-3(b)(1), unless the authorization is terminated  or revoked sooner.       Influenza A by PCR NEGATIVE NEGATIVE Final   Influenza B by PCR NEGATIVE NEGATIVE Final    Comment: (NOTE) The Xpert Xpress SARS-CoV-2/FLU/RSV plus assay is intended as an aid in the diagnosis of influenza from Nasopharyngeal swab specimens and should not be  used as a sole basis for treatment. Nasal washings and aspirates are unacceptable for Xpert Xpress SARS-CoV-2/FLU/RSV testing.  Fact Sheet for Patients: EntrepreneurPulse.com.au  Fact Sheet for Healthcare Providers: IncredibleEmployment.be  This test is not yet approved or cleared by the [place] FDA and has been authorized for detection and/or diagnosis of SARS-CoV-2 by FDA under an Emergency Use Authorization (EUA). This EUA will remain in effect (meaning this test can be used) for the duration of the COVID-19 declaration under Section 564(b)(1) of the Act, 21 U.S.C. section 360bbb-3(b)(1), unless the authorization is terminated or revoked.  Performed at [facility], [place] [address], [place], [place] [address]   Group A  Strep by PCR     Status: None   Collection Time: 02/02/21  9:20 PM   Specimen: Throat; Sterile Swab  Result Value Ref Range Status   Group A Strep by PCR NOT DETECTED NOT DETECTED Final    Comment: Performed at [facility], [place] [address], [place], [place] [address]     Labs: BNP (last 3 results) Recent Labs    07/11/20 0750 10/30/20 1949 01/29/21 0030  BNP 234.9* 300.3* 938.1*   Basic Metabolic Panel: Recent Labs  Lab 01/29/21 0030 01/29/21 0152 01/29/21 0625 01/30/21 0332 01/31/21 0143 02/01/21 0748 02/02/21 0326  NA 135   < > 134* 135 136 137 139  K 4.2   < > 3.6 4.9 3.3* 3.9 3.8  CL 98  --  96* 97* 96* 102 100  CO2 25  --  $R'26 28 28 26 28  'Ua$ GLUCOSE 268*  --  419* 396* 240* 191* 128*  BUN 14  --  $R'14 17 16 13 11  'qR$ CREATININE 0.90  --  0.97 0.98 0.96 0.89 0.87  CALCIUM 9.6  --  9.2 9.2 8.8* 9.0 9.4  MG 1.8  --   --   --  2.2  --   --    < > = values in this interval not displayed.   Liver Function Tests: Recent Labs  Lab 01/29/21 0030 01/29/21 0625 02/01/21 0748  AST $Re'18 17 25  'QII$ ALT $R'14 12 16  'Wq$ ALKPHOS 99 96 86  BILITOT 0.5 0.9 0.6  PROT 7.2 6.9 6.3*  ALBUMIN 3.4* 3.3* 2.8*   No results for input(s): LIPASE, AMYLASE in the last 168 hours. No results for input(s): AMMONIA in the last 168 hours. CBC: Recent Labs  Lab 01/29/21 0030 01/29/21 0152 01/29/21 0625 01/30/21 0332 01/31/21 0143 02/01/21 0400 02/02/21 0326 02/03/21 0855  WBC 8.1  --  7.4 11.5* 8.4 6.5 6.4 8.0  NEUTROABS 5.2  --  6.9  --   --   --   --   --   HGB 11.8*   < > 11.3* 10.7* 10.4* 11.0* 11.6* 11.6*  HCT 37.5   < > 34.9* 33.0* 33.7* 35.0* 36.3 37.8  MCV 96.6  --  94.1 94.3 97.4 97.5 96.5 96.4  PLT 222  --  221 235 234 210 223 232   < > = values in this interval not displayed.   Cardiac Enzymes: No results for input(s): CKTOTAL, CKMB, CKMBINDEX, TROPONINI in the last 168 hours. BNP: Invalid input(s): POCBNP CBG: Recent Labs  Lab 02/02/21 0600 02/02/21 1056 02/02/21 1655  02/02/21 2117 02/03/21 0523  GLUCAP 160* 372* 232* 166* 198*   D-Dimer No results for input(s): DDIMER in the last 72 hours. Hgb A1c No results for input(s): HGBA1C in the last 72 hours. Lipid Profile No  results for input(s): CHOL, HDL, LDLCALC, TRIG, CHOLHDL, LDLDIRECT in the last 72 hours. Thyroid function studies No results for input(s): TSH, T4TOTAL, T3FREE, THYROIDAB in the last 72 hours.  Invalid input(s): FREET3 Anemia work up No results for input(s): VITAMINB12, FOLATE, FERRITIN, TIBC, IRON, RETICCTPCT in the last 72 hours. Urinalysis    Component Value Date/Time   COLORURINE YELLOW 10/30/2020 1759   APPEARANCEUR CLEAR 10/30/2020 1759   LABSPEC 1.009 10/30/2020 1759   PHURINE 7.0 10/30/2020 1759   GLUCOSEU 50 (A) 10/30/2020 1759   GLUCOSEU 100 (A) 08/13/2020 1550   HGBUR NEGATIVE 10/30/2020 1759   BILIRUBINUR NEGATIVE 10/30/2020 1759   KETONESUR NEGATIVE 10/30/2020 1759   PROTEINUR NEGATIVE 10/30/2020 1759   UROBILINOGEN 0.2 08/13/2020 1550   NITRITE NEGATIVE 10/30/2020 1759   LEUKOCYTESUR NEGATIVE 10/30/2020 1759   Sepsis Labs Invalid input(s): PROCALCITONIN,  WBC,  LACTICIDVEN Microbiology Recent Results (from the past 240 hour(s))  Resp Panel by RT-PCR (Flu A&B, Covid) Nasopharyngeal Swab     Status: None   Collection Time: 01/29/21  1:06 AM   Specimen: Nasopharyngeal Swab; Nasopharyngeal(NP) swabs in vial transport medium  Result Value Ref Range Status   SARS Coronavirus 2 by RT PCR NEGATIVE NEGATIVE Final    Comment: (NOTE) SARS-CoV-2 target nucleic acids are NOT DETECTED.  The SARS-CoV-2 RNA is generally detectable in upper respiratory specimens during the acute phase of infection. The lowest concentration of SARS-CoV-2 viral copies this assay can detect is 138 copies/mL. A negative result does not preclude SARS-Cov-2 infection and should not be used as the sole basis for treatment or other patient management decisions. A negative result may occur with   improper specimen collection/handling, submission of specimen other than nasopharyngeal swab, presence of viral mutation(s) within the areas targeted by this assay, and inadequate number of viral copies(<138 copies/mL). A negative result must be combined with clinical observations, patient history, and epidemiological information. The expected result is Negative.  Fact Sheet for Patients:  EntrepreneurPulse.com.au  Fact Sheet for Healthcare Providers:  IncredibleEmployment.be  This test is no t yet approved or cleared by the [place] FDA and  has been authorized for detection and/or diagnosis of SARS-CoV-2 by FDA under an Emergency Use Authorization (EUA). This EUA will remain  in effect (meaning this test can be used) for the duration of the COVID-19 declaration under Section 564(b)(1) of the Act, 21 U.S.C.section 360bbb-3(b)(1), unless the authorization is terminated  or revoked sooner.       Influenza A by PCR NEGATIVE NEGATIVE Final   Influenza B by PCR NEGATIVE NEGATIVE Final    Comment: (NOTE) The Xpert Xpress SARS-CoV-2/FLU/RSV plus assay is intended as an aid in the diagnosis of influenza from Nasopharyngeal swab specimens and should not be used as a sole basis for treatment. Nasal washings and aspirates are unacceptable for Xpert Xpress SARS-CoV-2/FLU/RSV testing.  Fact Sheet for Patients: EntrepreneurPulse.com.au  Fact Sheet for Healthcare Providers: IncredibleEmployment.be  This test is not yet approved or cleared by the [place] FDA and has been authorized for detection and/or diagnosis of SARS-CoV-2 by FDA under an Emergency Use Authorization (EUA). This EUA will remain in effect (meaning this test can be used) for the duration of the COVID-19 declaration under Section 564(b)(1) of the Act, 21 U.S.C. section 360bbb-3(b)(1), unless the authorization is terminated  or revoked.  Performed at [facility], [place] [address], [place], [place] [address]   Group A Strep by PCR     Status: None   Collection  Time: 02/02/21  9:20 PM   Specimen: Throat; Sterile Swab  Result Value Ref Range Status   Group A Strep by PCR NOT DETECTED NOT DETECTED Final    Comment: Performed at [facility], [address], [place], [place] [address]     Time coordinating discharge: 35 minutes The [place] controlled substances registry was reviewed for this patient     30 Day Unplanned Readmission Risk Score    Flowsheet Row ED to Hosp-Admission (Current) from 01/28/2021 in [facility]  30 Day Unplanned Readmission Risk Score (%) 27.63 Filed at 02/03/2021 0801       This score is the patient's risk of an unplanned readmission within 30 days of being discharged (0 -100%). The score is based on dignosis, age, lab data, medications, orders, and past utilization.   Low:  0-14.9   Medium: 15-21.9   High: 22-29.9   Extreme: 30 and above             SIGNED:   Edwin Dada, MD  [facility] 02/03/2021, 11:03 AM

## 2021-02-03 NOTE — Evaluation (Signed)
Occupational Therapy Evaluation Patient Details Name: Katherine [name] MRN: [id number] DOB: October 13, 1955 Today's Date: 02/03/2021    History of Present Illness 65 y.o. female presents to [facility] ED on 01/29/2021 with reports of SOB and chest pain. Pt reports chest pain at rest and with exertion over the past month. Chest x-ray concerning for fluid overload, EKG with LBBB, troponins uptrending in ED. Pt admitted for acute on chronic systolic CHF. PMH includes CAD status post stenting, renal transplant, diastolic CHF, COPD, diabetes mellitus type 2.   Clinical Impression   PTA, pt lives alone and reports Modified Independence with ADLs/mobility with intermittent use of cane or furniture walking in the home. A niece assists with IADLs as needed. Pt presents now with deficits in dynamic standing balance, endurance, and pain (new R knee pain due to fall at home, chronic L LE pain). Pt overall Independent with UB ADLs, Supervision for LB ADLs and min guard for mobility with RW. Pt reports feeling unsteady without AD use for mobility. Pt quality of life also impacted by pain, as ADLs/IADLs have become increasingly difficult. Collaborated with possible modifications to decrease fall risk, minimize pain with daily tasks with HHOT follow-up recommended to maximize independence and quality of life at home.     Follow Up Recommendations  Home health OT;Supervision - Intermittent    Equipment Recommendations  None recommended by OT    Recommendations for Other Services       Precautions / Restrictions Precautions Precautions: Fall Restrictions Weight Bearing Restrictions: No      Mobility Bed Mobility Overal bed mobility: Modified Independent             General bed mobility comments: HOB elevated and heavy use of railing.    Transfers Overall transfer level: Needs assistance Equipment used: Rolling walker (2 wheeled) Transfers: Sit to/from Stand Sit to Stand: Min guard         General  transfer comment: VC for hand placement.    Balance Overall balance assessment: Needs assistance Sitting-balance support: Feet supported Sitting balance-Leahy Scale: Fair     Standing balance support: During functional activity;Bilateral upper extremity supported;Single extremity supported Standing balance-Leahy Scale: Fair Standing balance comment: pt stands briely without reliance of UE support                           ADL either performed or assessed with clinical judgement   ADL Overall ADL's : Needs assistance/impaired Eating/Feeding: Independent;Sitting   Grooming: Supervision/safety;Standing;Wash/dry face;Brushing hair Grooming Details (indicate cue type and reason): for safety though no LOB noted Upper Body Bathing: Independent;Sitting   Lower Body Bathing: Supervison/ safety;Sit to/from stand;Sitting/lateral leans   Upper Body Dressing : Independent;Sitting   Lower Body Dressing: Supervision/safety;Sitting/lateral leans;Sit to/from stand   Toilet Transfer: Min guard;Ambulation;RW   Toileting- Clothing Manipulation and Hygiene: Supervision/safety;Sit to/from stand       Functional mobility during ADLs: Min guard;Rolling walker General ADL Comments: Pt limited by chronic L LE pain and new R knee pain (secondary to fall in bathtub prior to admission). Pt able to mobilize and complete ADLs with limited assist though difficult due to pain     Vision Baseline Vision/History: Wears glasses Wears Glasses: At all times Patient Visual Report: No change from baseline Vision Assessment?: No apparent visual deficits     Perception     Praxis      Pertinent Vitals/Pain Pain Assessment: Faces Faces Pain Scale: Hurts little more Pain Location: L LE  and R knee Pain Descriptors / Indicators: Grimacing;Guarding;Constant Pain Intervention(s): Limited activity within patient's tolerance;Monitored during session     Hand Dominance Right   Extremity/Trunk  Assessment Upper Extremity Assessment Upper Extremity Assessment: Overall WFL for tasks assessed   Lower Extremity Assessment Lower Extremity Assessment: Defer to PT evaluation   Cervical / Trunk Assessment Cervical / Trunk Assessment: Normal   Communication Communication Communication: No difficulties   Cognition Arousal/Alertness: Awake/alert Behavior During Therapy: WFL for tasks assessed/performed Overall Cognitive Status: Within Functional Limits for tasks assessed                                     General Comments       Exercises     Shoulder Instructions      Home Living Family/patient expects to be discharged to:: Private residence Living Arrangements: Alone Available Help at Discharge: Family;Available PRN/intermittently Type of Home: Mobile home Home Access: Stairs to enter Entrance Stairs-Number of Steps: 3 Entrance Stairs-Rails: Left Home Layout: One level     Bathroom Shower/Tub: Teacher, early years/pre: Standard     Home Equipment: Environmental consultant - 4 wheels;Cane - single point;Tub bench          Prior Functioning/Environment Level of Independence: Independent        Comments: Pt reports typically ambulating without device, holding onto walls in the home. Pt uses cane at times when pain is exacerbated. Able to complete ADLs but had a fall in bathtub prior to admission. Niece assists with IADLs, like cleaning as needed. Groceries and prescriptions delivered. limited by pain        OT Problem List: Decreased activity tolerance;Impaired balance (sitting and/or standing);Pain;Decreased knowledge of use of DME or AE;Obesity      OT Treatment/Interventions: Self-care/ADL training;Therapeutic exercise;Energy conservation;DME and/or AE instruction;Therapeutic activities;Patient/family education;Balance training    OT Goals(Current goals can be found in the care plan section) Acute Rehab OT Goals Patient Stated Goal: get better and  go home OT Goal Formulation: With patient Time For Goal Achievement: 02/17/21 Potential to Achieve Goals: Good  OT Frequency: Min 2X/week   Barriers to D/C:            Co-evaluation              AM-PAC OT "6 Clicks" Daily Activity     Outcome Measure Help from another person eating meals?: None Help from another person taking care of personal grooming?: A Little Help from another person toileting, which includes using toliet, bedpan, or urinal?: A Little Help from another person bathing (including washing, rinsing, drying)?: A Little Help from another person to put on and taking off regular upper body clothing?: None Help from another person to put on and taking off regular lower body clothing?: A Little 6 Click Score: 20   End of Session Equipment Utilized During Treatment: Gait belt;Rolling walker;Oxygen Nurse Communication: Mobility status;Other (comment) (request for various drinks)  Activity Tolerance: Patient tolerated treatment well;Patient limited by pain Patient left: in bed;with call bell/phone within reach  OT Visit Diagnosis: Other abnormalities of gait and mobility (R26.89);Pain Pain - Right/Left:  (bilateral) Pain - part of body: Knee;Leg                Time: 7858-8502 OT Time Calculation (min): 27 min Charges:  OT General Charges $OT Visit: 1 Visit OT Evaluation $OT Eval Low Complexity: 1 Low OT Treatments $Self Care/Home  Management : 8-22 mins  Katherine [name], OTR/L Acute Rehab Services Office: [phone]   Katherine [name] 02/03/2021, 11:56 AM

## 2021-02-03 NOTE — Consult Note (Signed)
   [facility] Inpatient Consult   02/03/2021  Natascha Edmonds April 22, 1956 [id number]  [place]  Accountable Care Organization [ACO] Patient: Medicare  Embedded CM with Sonda Primes, MD  Patient evaluated for community based chronic care management services with [place] [facility] as a benefit of patient's [employer]. Spoke with patient at bedside to explain [place] Management services.  Patient will receive post hospital discharge call and for assessments fo community needs for care/disease management.  Patient endorses [name], MD as provider.  Made Inpatient Transition of Care [TOC] Case Manager aware that [place] Management following is planned  Plan:  Referral to Embedded CCM team for follow up for diabetes management A1C 9.1 and for HF exacerbation needs.   Of note, [facility] Care Management services does not replace or interfere with any services that are arranged by inpatient Transition of Care [TOC] team     For additional questions or referrals please contact:    Natividad Brood, RN BSN [facility] Liaison  [phone] business mobile phone Toll free office [phone]  Fax number: [phone] [name]

## 2021-02-04 ENCOUNTER — Other Ambulatory Visit: Payer: Self-pay | Admitting: Physical Medicine & Rehabilitation

## 2021-02-05 ENCOUNTER — Ambulatory Visit: Payer: Medicare Other | Admitting: Neurology

## 2021-02-05 ENCOUNTER — Ambulatory Visit: Payer: Medicare Other

## 2021-02-06 ENCOUNTER — Ambulatory Visit: Payer: Medicare Other | Admitting: Neurology

## 2021-02-07 ENCOUNTER — Telehealth: Payer: Self-pay | Admitting: *Deleted

## 2021-02-07 NOTE — Chronic Care Management (AMB) (Signed)
  Chronic Care Management   Outreach Note  02/07/2021 Name: Katherine [name] MRN: [id number] DOB: [birth year]  Katherine [name] is a 65 y.o. year old female who is a primary care patient of [name], MD. I reached out to [name] by phone today in response to a referral sent by Ms. Linton Rump Tapley's PCP [name], MD     An unsuccessful telephone outreach was attempted today. The patient was referred to the case management team for assistance with care management and care coordination.   Follow Up Plan: A HIPAA compliant phone message was left for the patient providing contact information and requesting a return call.  If patient returns call to provider office, please advise to call Embedded Care Management Care Guide Shantale Holtmeyer at [place] [place] [facility]  Direct Dial: [phone]

## 2021-02-09 ENCOUNTER — Other Ambulatory Visit: Payer: Self-pay | Admitting: Emergency Medicine

## 2021-02-10 ENCOUNTER — Other Ambulatory Visit (HOSPITAL_COMMUNITY): Payer: Self-pay | Admitting: Nurse Practitioner

## 2021-02-10 ENCOUNTER — Inpatient Hospital Stay: Payer: Medicare Other | Admitting: Internal Medicine

## 2021-02-10 ENCOUNTER — Telehealth (HOSPITAL_COMMUNITY): Payer: Self-pay | Admitting: Surgery

## 2021-02-10 ENCOUNTER — Other Ambulatory Visit: Payer: Self-pay | Admitting: Emergency Medicine

## 2021-02-10 DIAGNOSIS — I480 Paroxysmal atrial fibrillation: Secondary | ICD-10-CM

## 2021-02-10 NOTE — Telephone Encounter (Signed)
I called Katherine [name] regarding her [facility] tomorrow at 1pm.  She was unsure of the appt need. I explained to her that it is to optimize her HF treatment and to be sure that she has the best outcome.  She tells me that she will make every effort to be her tomorrow.

## 2021-02-10 NOTE — Chronic Care Management (AMB) (Signed)
  Chronic Care Management   Note  02/10/2021 Name: Katherine [name] MRN: [id number] DOB: 24-Mar-1956  Katherine [name] is a 65 y.o. year old female who is a primary care patient of [name], MD. I reached out to [name] by phone today in response to a referral sent by Katherine [name] PCP, Arvid Right, MD     Katherine [name] was given information about Chronic Care Management services today including:  CCM service includes personalized support from designated clinical staff supervised by her physician, including individualized plan of care and coordination with other care providers 24/7 contact phone numbers for assistance for urgent and routine care needs. Service will only be billed when office clinical staff spend 20 minutes or more in a month to coordinate care. Only one practitioner may furnish and bill the service in a calendar month. The patient may stop CCM services at any time (effective at the end of the month) by phone call to the office staff. The patient will be responsible for cost sharing (co-pay) of up to 20% of the service fee (after annual deductible is met).  Patient agreed to services and verbal consent obtained.   Follow up plan: Telephone appointment with care management team member scheduled for: 02/19/2021  Julian Hy, [place] Management  Direct Dial: [phone]

## 2021-02-11 ENCOUNTER — Telehealth (HOSPITAL_COMMUNITY): Payer: Self-pay | Admitting: Surgery

## 2021-02-11 ENCOUNTER — Telehealth: Payer: Medicare Other | Admitting: Cardiology

## 2021-02-11 ENCOUNTER — Encounter (HOSPITAL_COMMUNITY): Payer: Medicare Other

## 2021-02-11 NOTE — Telephone Encounter (Signed)
Katherine [name] contacted me regarding her scheduled appt today in [facility].  She would like to move it to tomorrow in order to consolidate appts because she is also scheduled to come to the [facility] clinic. I have rescheduled her appt to 2 pm tomorrow.  She is aware and agreeable.

## 2021-02-12 ENCOUNTER — Ambulatory Visit (HOSPITAL_COMMUNITY): Payer: Medicare Other | Admitting: Nurse Practitioner

## 2021-02-12 ENCOUNTER — Telehealth (HOSPITAL_COMMUNITY): Payer: Self-pay | Admitting: Surgery

## 2021-02-12 ENCOUNTER — Encounter (HOSPITAL_COMMUNITY): Payer: Medicare Other

## 2021-02-12 NOTE — Telephone Encounter (Signed)
Patient called to cancel her appt for today in [facility] today.  I cancelled the appt in [facility] and will call her to reschedule.

## 2021-02-14 ENCOUNTER — Other Ambulatory Visit: Payer: Self-pay | Admitting: Cardiology

## 2021-02-18 ENCOUNTER — Other Ambulatory Visit (HOSPITAL_COMMUNITY): Payer: Self-pay | Admitting: Nurse Practitioner

## 2021-02-19 ENCOUNTER — Telehealth: Payer: Medicare Other

## 2021-02-19 ENCOUNTER — Inpatient Hospital Stay (HOSPITAL_COMMUNITY)
Admission: EM | Admit: 2021-02-19 | Discharge: 2021-02-21 | DRG: 247 | Disposition: A | Discharge status: Home / Self Care | Payer: Medicare Other | Attending: Cardiovascular Disease | Admitting: Cardiovascular Disease

## 2021-02-19 ENCOUNTER — Other Ambulatory Visit: Payer: Self-pay

## 2021-02-19 ENCOUNTER — Emergency Department (HOSPITAL_COMMUNITY): Payer: Medicare Other

## 2021-02-19 ENCOUNTER — Telehealth: Payer: Self-pay | Admitting: Cardiology

## 2021-02-19 ENCOUNTER — Encounter (HOSPITAL_COMMUNITY): Payer: Self-pay

## 2021-02-19 DIAGNOSIS — Z79899 Other long term (current) drug therapy: Secondary | ICD-10-CM

## 2021-02-19 DIAGNOSIS — Z7984 Long term (current) use of oral hypoglycemic drugs: Secondary | ICD-10-CM

## 2021-02-19 DIAGNOSIS — G4733 Obstructive sleep apnea (adult) (pediatric): Secondary | ICD-10-CM | POA: Diagnosis present

## 2021-02-19 DIAGNOSIS — Z20822 Contact with and (suspected) exposure to covid-19: Secondary | ICD-10-CM | POA: Diagnosis present

## 2021-02-19 DIAGNOSIS — Z7982 Long term (current) use of aspirin: Secondary | ICD-10-CM

## 2021-02-19 DIAGNOSIS — I70201 Unspecified atherosclerosis of native arteries of extremities, right leg: Secondary | ICD-10-CM | POA: Diagnosis present

## 2021-02-19 DIAGNOSIS — R079 Chest pain, unspecified: Secondary | ICD-10-CM | POA: Diagnosis not present

## 2021-02-19 DIAGNOSIS — Z940 Kidney transplant status: Secondary | ICD-10-CM

## 2021-02-19 DIAGNOSIS — E1122 Type 2 diabetes mellitus with diabetic chronic kidney disease: Secondary | ICD-10-CM | POA: Diagnosis not present

## 2021-02-19 DIAGNOSIS — I6523 Occlusion and stenosis of bilateral carotid arteries: Secondary | ICD-10-CM | POA: Diagnosis present

## 2021-02-19 DIAGNOSIS — F419 Anxiety disorder, unspecified: Secondary | ICD-10-CM | POA: Diagnosis present

## 2021-02-19 DIAGNOSIS — E1142 Type 2 diabetes mellitus with diabetic polyneuropathy: Secondary | ICD-10-CM | POA: Diagnosis not present

## 2021-02-19 DIAGNOSIS — I25119 Atherosclerotic heart disease of native coronary artery with unspecified angina pectoris: Secondary | ICD-10-CM

## 2021-02-19 DIAGNOSIS — I25110 Atherosclerotic heart disease of native coronary artery with unstable angina pectoris: Secondary | ICD-10-CM

## 2021-02-19 DIAGNOSIS — H547 Unspecified visual loss: Secondary | ICD-10-CM | POA: Diagnosis present

## 2021-02-19 DIAGNOSIS — R0902 Hypoxemia: Secondary | ICD-10-CM | POA: Diagnosis not present

## 2021-02-19 DIAGNOSIS — Z87442 Personal history of urinary calculi: Secondary | ICD-10-CM

## 2021-02-19 DIAGNOSIS — K219 Gastro-esophageal reflux disease without esophagitis: Secondary | ICD-10-CM | POA: Diagnosis present

## 2021-02-19 DIAGNOSIS — R0789 Other chest pain: Secondary | ICD-10-CM | POA: Diagnosis not present

## 2021-02-19 DIAGNOSIS — I358 Other nonrheumatic aortic valve disorders: Secondary | ICD-10-CM | POA: Diagnosis present

## 2021-02-19 DIAGNOSIS — G8929 Other chronic pain: Secondary | ICD-10-CM | POA: Diagnosis present

## 2021-02-19 DIAGNOSIS — F32A Depression, unspecified: Secondary | ICD-10-CM | POA: Diagnosis present

## 2021-02-19 DIAGNOSIS — I480 Paroxysmal atrial fibrillation: Secondary | ICD-10-CM | POA: Diagnosis present

## 2021-02-19 DIAGNOSIS — I152 Hypertension secondary to endocrine disorders: Secondary | ICD-10-CM | POA: Diagnosis present

## 2021-02-19 DIAGNOSIS — Z9981 Dependence on supplemental oxygen: Secondary | ICD-10-CM

## 2021-02-19 DIAGNOSIS — I200 Unstable angina: Secondary | ICD-10-CM | POA: Diagnosis not present

## 2021-02-19 DIAGNOSIS — Z8249 Family history of ischemic heart disease and other diseases of the circulatory system: Secondary | ICD-10-CM

## 2021-02-19 DIAGNOSIS — M797 Fibromyalgia: Secondary | ICD-10-CM | POA: Diagnosis present

## 2021-02-19 DIAGNOSIS — I2510 Atherosclerotic heart disease of native coronary artery without angina pectoris: Secondary | ICD-10-CM

## 2021-02-19 DIAGNOSIS — J449 Chronic obstructive pulmonary disease, unspecified: Secondary | ICD-10-CM | POA: Diagnosis present

## 2021-02-19 DIAGNOSIS — E1169 Type 2 diabetes mellitus with other specified complication: Secondary | ICD-10-CM | POA: Diagnosis present

## 2021-02-19 DIAGNOSIS — Z8614 Personal history of Methicillin resistant Staphylococcus aureus infection: Secondary | ICD-10-CM

## 2021-02-19 DIAGNOSIS — I517 Cardiomegaly: Secondary | ICD-10-CM | POA: Diagnosis not present

## 2021-02-19 DIAGNOSIS — M069 Rheumatoid arthritis, unspecified: Secondary | ICD-10-CM | POA: Diagnosis present

## 2021-02-19 DIAGNOSIS — Z7902 Long term (current) use of antithrombotics/antiplatelets: Secondary | ICD-10-CM

## 2021-02-19 DIAGNOSIS — Z885 Allergy status to narcotic agent status: Secondary | ICD-10-CM

## 2021-02-19 DIAGNOSIS — I739 Peripheral vascular disease, unspecified: Secondary | ICD-10-CM | POA: Diagnosis present

## 2021-02-19 DIAGNOSIS — Z794 Long term (current) use of insulin: Secondary | ICD-10-CM

## 2021-02-19 DIAGNOSIS — Z955 Presence of coronary angioplasty implant and graft: Secondary | ICD-10-CM

## 2021-02-19 DIAGNOSIS — Z8673 Personal history of transient ischemic attack (TIA), and cerebral infarction without residual deficits: Secondary | ICD-10-CM

## 2021-02-19 DIAGNOSIS — Z951 Presence of aortocoronary bypass graft: Secondary | ICD-10-CM

## 2021-02-19 DIAGNOSIS — Z882 Allergy status to sulfonamides status: Secondary | ICD-10-CM

## 2021-02-19 DIAGNOSIS — I4819 Other persistent atrial fibrillation: Secondary | ICD-10-CM | POA: Diagnosis not present

## 2021-02-19 DIAGNOSIS — Z9049 Acquired absence of other specified parts of digestive tract: Secondary | ICD-10-CM

## 2021-02-19 DIAGNOSIS — T82855A Stenosis of coronary artery stent, initial encounter: Secondary | ICD-10-CM

## 2021-02-19 DIAGNOSIS — E785 Hyperlipidemia, unspecified: Secondary | ICD-10-CM | POA: Diagnosis present

## 2021-02-19 DIAGNOSIS — J811 Chronic pulmonary edema: Secondary | ICD-10-CM | POA: Diagnosis not present

## 2021-02-19 DIAGNOSIS — I959 Hypotension, unspecified: Secondary | ICD-10-CM | POA: Diagnosis not present

## 2021-02-19 DIAGNOSIS — Z87891 Personal history of nicotine dependence: Secondary | ICD-10-CM

## 2021-02-19 DIAGNOSIS — I252 Old myocardial infarction: Secondary | ICD-10-CM

## 2021-02-19 DIAGNOSIS — I447 Left bundle-branch block, unspecified: Secondary | ICD-10-CM | POA: Diagnosis not present

## 2021-02-19 DIAGNOSIS — R011 Cardiac murmur, unspecified: Secondary | ICD-10-CM | POA: Diagnosis present

## 2021-02-19 DIAGNOSIS — I214 Non-ST elevation (NSTEMI) myocardial infarction: Secondary | ICD-10-CM

## 2021-02-19 DIAGNOSIS — Z800 Family history of malignant neoplasm of digestive organs: Secondary | ICD-10-CM

## 2021-02-19 DIAGNOSIS — Z7901 Long term (current) use of anticoagulants: Secondary | ICD-10-CM

## 2021-02-19 LAB — CBC WITH DIFFERENTIAL/PLATELET
Abs Immature Granulocytes: 0.07 10*3/uL (ref 0.00–0.07)
Basophils Absolute: 0.1 10*3/uL (ref 0.0–0.1)
Basophils Relative: 1 %
Eosinophils Absolute: 0.2 10*3/uL (ref 0.0–0.5)
Eosinophils Relative: 2 %
HCT: 33.1 % — ABNORMAL LOW (ref 36.0–46.0)
Hemoglobin: 10.6 g/dL — ABNORMAL LOW (ref 12.0–15.0)
Immature Granulocytes: 1 %
Lymphocytes Relative: 7 %
Lymphs Abs: 0.8 10*3/uL (ref 0.7–4.0)
MCH: 30.9 pg (ref 26.0–34.0)
MCHC: 32 g/dL (ref 30.0–36.0)
MCV: 96.5 fL (ref 80.0–100.0)
Monocytes Absolute: 0.8 10*3/uL (ref 0.1–1.0)
Monocytes Relative: 7 %
Neutro Abs: 8.8 10*3/uL — ABNORMAL HIGH (ref 1.7–7.7)
Neutrophils Relative %: 82 %
Platelets: 230 10*3/uL (ref 150–400)
RBC: 3.43 MIL/uL — ABNORMAL LOW (ref 3.87–5.11)
RDW: 16.2 % — ABNORMAL HIGH (ref 11.5–15.5)
WBC: 10.7 10*3/uL — ABNORMAL HIGH (ref 4.0–10.5)
nRBC: 0 % (ref 0.0–0.2)

## 2021-02-19 LAB — COMPREHENSIVE METABOLIC PANEL
ALT: 12 U/L (ref 0–44)
AST: 26 U/L (ref 15–41)
Albumin: 3 g/dL — ABNORMAL LOW (ref 3.5–5.0)
Alkaline Phosphatase: 106 U/L (ref 38–126)
Anion gap: 10 (ref 5–15)
BUN: 14 mg/dL (ref 8–23)
CO2: 25 mmol/L (ref 22–32)
Calcium: 9.1 mg/dL (ref 8.9–10.3)
Chloride: 101 mmol/L (ref 98–111)
Creatinine, Ser: 0.97 mg/dL (ref 0.44–1.00)
GFR, Estimated: >60 mL/min (ref >60)
Glucose, Bld: 240 mg/dL — ABNORMAL HIGH (ref 70–99)
Potassium: 4.4 mmol/L (ref 3.5–5.1)
Sodium: 136 mmol/L (ref 135–145)
Total Bilirubin: 0.6 mg/dL (ref 0.3–1.2)
Total Protein: 6.5 g/dL (ref 6.5–8.1)

## 2021-02-19 LAB — TROPONIN I (HIGH SENSITIVITY)
Troponin I (High Sensitivity): 25 ng/L — ABNORMAL HIGH (ref <18)
Troponin I (High Sensitivity): 42 ng/L — ABNORMAL HIGH (ref <18)

## 2021-02-19 LAB — CBG MONITORING, ED: Glucose-Capillary: 241 mg/dL — ABNORMAL HIGH (ref 70–99)

## 2021-02-19 MED ORDER — CLOPIDOGREL BISULFATE 300 MG PO TABS
300.0000 mg | ORAL_TABLET | Freq: Once | ORAL | Status: AC
  Administered 2021-02-19: 300 mg via ORAL
  Filled 2021-02-19: qty 1

## 2021-02-19 MED ORDER — PREDNISONE 5 MG PO TABS
5.0000 mg | ORAL_TABLET | Freq: Every day | ORAL | Status: DC
  Administered 2021-02-20 – 2021-02-21 (×2): 5 mg via ORAL
  Filled 2021-02-19 (×2): qty 1

## 2021-02-19 MED ORDER — NITROGLYCERIN 0.4 MG SL SUBL: 0.4000 mg | SUBLINGUAL_TABLET | SUBLINGUAL | Status: DC | PRN

## 2021-02-19 MED ORDER — ASPIRIN 300 MG RE SUPP
300.0000 mg | RECTAL | Status: DC
  Filled 2021-02-19: qty 1

## 2021-02-19 MED ORDER — SERTRALINE HCL 100 MG PO TABS
100.0000 mg | ORAL_TABLET | Freq: Every day | ORAL | Status: DC
  Administered 2021-02-20 – 2021-02-21 (×2): 100 mg via ORAL
  Filled 2021-02-19 (×2): qty 1

## 2021-02-19 MED ORDER — ROSUVASTATIN CALCIUM 20 MG PO TABS
40.0000 mg | ORAL_TABLET | Freq: Every day | ORAL | Status: DC
  Administered 2021-02-19 – 2021-02-21 (×3): 40 mg via ORAL
  Filled 2021-02-19 (×3): qty 2

## 2021-02-19 MED ORDER — INSULIN ASPART 100 UNIT/ML IJ SOLN
0.0000 [IU] | Freq: Three times a day (TID) | INTRAMUSCULAR | Status: DC
Start: 2021-02-20 — End: 2021-02-21
  Administered 2021-02-20: 2 [IU] via SUBCUTANEOUS
  Administered 2021-02-20: 8 [IU] via SUBCUTANEOUS
  Administered 2021-02-21: 5 [IU] via SUBCUTANEOUS
  Administered 2021-02-21: 3 [IU] via SUBCUTANEOUS
  Administered 2021-02-21: 11 [IU] via SUBCUTANEOUS

## 2021-02-19 MED ORDER — DOFETILIDE 250 MCG PO CAPS
250.0000 ug | ORAL_CAPSULE | Freq: Two times a day (BID) | ORAL | Status: DC
  Administered 2021-02-20 – 2021-02-21 (×2): 250 ug via ORAL
  Filled 2021-02-19 (×4): qty 1

## 2021-02-19 MED ORDER — ASPIRIN 81 MG PO CHEW: 324.0000 mg | CHEWABLE_TABLET | ORAL | Status: DC

## 2021-02-19 MED ORDER — ISOSORBIDE MONONITRATE ER 60 MG PO TB24
60.0000 mg | ORAL_TABLET | Freq: Every day | ORAL | Status: DC
  Administered 2021-02-20 – 2021-02-21 (×2): 60 mg via ORAL
  Filled 2021-02-19 (×2): qty 1

## 2021-02-19 MED ORDER — HEPARIN (PORCINE) 25000 UT/250ML-% IV SOLN
1200.0000 [IU]/h | INTRAVENOUS | Status: DC
  Administered 2021-02-19: 1100 [IU]/h via INTRAVENOUS
  Filled 2021-02-19: qty 250

## 2021-02-19 MED ORDER — FUROSEMIDE 40 MG PO TABS
40.0000 mg | ORAL_TABLET | Freq: Every day | ORAL | Status: DC
  Administered 2021-02-20 – 2021-02-21 (×2): 40 mg via ORAL
  Filled 2021-02-19 (×2): qty 1

## 2021-02-19 MED ORDER — LORAZEPAM 1 MG PO TABS
1.0000 mg | ORAL_TABLET | Freq: Two times a day (BID) | ORAL | Status: DC
  Administered 2021-02-19 – 2021-02-21 (×4): 1 mg via ORAL
  Filled 2021-02-19 (×4): qty 1

## 2021-02-19 MED ORDER — ASPIRIN EC 81 MG PO TBEC
81.0000 mg | DELAYED_RELEASE_TABLET | Freq: Every day | ORAL | Status: DC
  Administered 2021-02-20 – 2021-02-21 (×2): 81 mg via ORAL
  Filled 2021-02-19 (×2): qty 1

## 2021-02-19 MED ORDER — PROMETHAZINE HCL 25 MG PO TABS
25.0000 mg | ORAL_TABLET | Freq: Four times a day (QID) | ORAL | Status: DC | PRN
  Administered 2021-02-20 – 2021-02-21 (×2): 25 mg via ORAL
  Filled 2021-02-19 (×2): qty 1

## 2021-02-19 MED ORDER — METOPROLOL TARTRATE 100 MG PO TABS
100.0000 mg | ORAL_TABLET | Freq: Two times a day (BID) | ORAL | Status: DC
  Administered 2021-02-19 – 2021-02-21 (×3): 100 mg via ORAL
  Filled 2021-02-19: qty 1
  Filled 2021-02-19 (×2): qty 2

## 2021-02-19 MED ORDER — ONDANSETRON HCL 4 MG/2ML IJ SOLN: 4.0000 mg | Freq: Four times a day (QID) | INTRAMUSCULAR | Status: DC | PRN

## 2021-02-19 MED ORDER — OXYCODONE HCL 5 MG PO TABS
10.0000 mg | ORAL_TABLET | Freq: Three times a day (TID) | ORAL | Status: DC | PRN
  Administered 2021-02-19 – 2021-02-21 (×5): 10 mg via ORAL
  Filled 2021-02-19 (×5): qty 2

## 2021-02-19 MED ORDER — ACETAMINOPHEN 325 MG PO TABS
650.0000 mg | ORAL_TABLET | ORAL | Status: DC | PRN
  Administered 2021-02-20 – 2021-02-21 (×2): 650 mg via ORAL
  Filled 2021-02-19 (×2): qty 2

## 2021-02-19 NOTE — Telephone Encounter (Signed)
Pt c/o of Chest Pain: STAT if CP now or developed within 24 hours  1. Are you having CP right now? yes  2. Are you experiencing any other symptoms (ex. SOB, nausea, vomiting, sweating)? sob  3. How long have you been experiencing CP? This morning  4. Is your CP continuous or coming and going? continuous  5. Have you taken Nitroglycerin? no ?

## 2021-02-19 NOTE — H&P (Addendum)
Cardiology H&P:   Patient ID: Katherine [name] MRN: [id number]; DOB: Nov 10, 1955  Admit date: 02/19/2021 Date of Consult: 02/19/2021  PCP:  [name], MD   [facility] Providers Cardiologist:  [name], MD     Patient Profile:   Katherine [name] is a 65 y.o. female with a hx of CAD s/p CABG multiple PCIs, persistent atrial fib (eliquis and tikosyn), HLD, PAD, renal transplant '91, COPD and DM who is being seen 02/19/2021 for the evaluation of chest pain at the request of Dr. Ralene Bathe.  History of Present Illness:   Katherine [name] is a 65 year old female with past medical history noted above.  She has a history of anterior wall MI dating back to 1994 with PTCA.  Underwent CABG in 1995 with LIMA to LAD, SVG to diagonal, SVG to distal RCA.  In 2001 she had stent to SVG to diagonal, and in 2002 needed overlapping stenting to the same area.  In 2003 developed recurrent stenosis causing chest pain and underwent thrombectomy and tandem stenting.  In 2004 presented with unstable angina and needed PCI of SVG to diagonal at the distal anastomotic site with Taxus stent.  In 2015 developed stenosis to SVG to diagonal proximal to prior stenting.  Echo in 2019 EF was noted at 55 to 60% with grade 2 diastolic dysfunction and mild AS.   Underwent cardiac MRI 12/2019 for possible myocarditis which was negative with normal aortic root.  EF was noted at 56% with hypokinesis of the mid/anterior septal, inferior and apical septal walls.   She is also been followed in the A. fib clinic and currently on Tikosyn and Eliquis.  She is status post renal transplant.  On chronic home O2 secondary to COPD.  Recently admitted on 6/15 with chest pain.  Underwent cardiac catheterization on 6/20 with severe native CAD with CTO of the LAD and RCA, left circumflex with calcified 60% ostial lesion which was new.  LIMA to LAD was not visualized via right radial access.  SVG to D2 with recurrent high-grade 90% in-stent  restenosis along with 95% lesion in D2 after graft insertion.  Her case was reviewed with interventional colleagues with recommendation to intensify medical therapy and if this fails consider Flow Wire interrogation of the ostial circumflex with repeat PCI of SVG to D2.  She was treated with aspirin, metoprolol 100 mg twice daily, Crestor 40 mg daily and Imdur 30 mg daily added. Echo that admission showed EF of 55 to 60% with G3DD.   Presented to the ED with chest pain 7/6. Reports she had brief episodes of chest pain when she left the hospital but this morning she woke up and walked to the kitchen to get a drink. Developed centralized chest pain with radiation into the right arm. Rated pain 9/10. Called EMS, given nitro with resolution of pain.   In the ED labs showed stable electrolytes, Cr 0.97, hsTn 25, WBC 10.7, Hgb 10.6. CXR showed mild interstitial edema vs CHF. EKG shows SR with TWI inferolateral leads. At the time of interview, chest pain mostly resolved with brief twinges of pain. Remains on [place] _0  which is her baseline.   Past Medical History:  Diagnosis Date   Anemia    Anxiety    Bilateral carotid artery stenosis    Carotid duplex 09/3760: 8-31% LICA, 51-76% RICA, >16% RECA, f/u 1 yr suggested   CAD (coronary artery disease) of bypass graft 5/01; 3/'02, 8/'03, 10/'04; 1/15   PCI x 5 to  SVG-D1    CAD in native artery 07/1993   3 Vessel Disease (LAD-D1 & RCA) -- CABG (Dx in setting of inferior STEMI-PTCA of RCA)   CAD S/P percutaneous coronary angioplasty    PCI to SVG-D1 insertion/native D1 x 4 = '01 -(S660 BMS 2.5 x 9 anastomosis- D1); '02 - distal overlap ACS Pixel 2.5 x 8  BMS; '03 distal/native ISR/Thrombosis - Pixel 2.5 x 13; '04 - ISR-  Taxus 2.5 x 20 (covered all);; 1/15 - mid SVG-D1 (50% distal ISR) - Promus P 2.75 x 20 -- 2.8 mm   COPD mixed type (HCC)    Followed by Dr. Lamonte Sakai "pulmonologist said no COPD"   Depression    Depression with anxiety    Diabetes mellitus type 2 in  obese ([place])    Diarrhea    started after cholecystectomy and mass removed from intestine   Dyslipidemia, goal LDL below 70    08/2012: TC 137, TG 200, HDL 32!, LDL 45; on statin (followed by Dr.Deterding)   ESRD (end stage renal disease) ([place]) 1991   s/p Cadaveric Renal Transplant [facility] - Dr. Jimmy Footman)    Family history of adverse reaction to anesthesia    mom's bp dropped during/after anesthesia   Fibromyalgia    GERD (gastroesophageal reflux disease)    Glomerulonephritis, chronic, rapidly progressive 1989   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 07/1993   Rescue PTCA of RCA -- referred for CABG.   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 Servando Snare: LIMA-LAD, SVG-bifurcatingD1, SVG-rPDA   History of kidney stones    History of stroke 2012   "right eye stroke- half blind now"   Hypertension associated with diabetes ([place])    Mild aortic stenosis by prior echocardiogram 07/2019   Echo:  Mild aortic stenosis (gradients: Mean 14.3 mmHg -peak 24.9 mmHg).   Morbid obesity (HCC)    MRSA (methicillin resistant staph aureus) culture positive    OSA (obstructive sleep apnea)    no longer on CPAP or home O2, states she doesn't need now after lap band   PAD (peripheral artery disease) ([place]) 08/2013   LEA Dopplers to be read by Dr. Fletcher Anon   PAF (paroxysmal atrial fibrillation) ([place]) 06/2014   Noted on CardioNet Monitor  - --> rhythm control with Tikosyn (Dr. Rayann Heman); converted from warfarin to apixaban for anticoagulation.   Pneumonia    Recurrent boils    Bilateral Groin   Rheumatoid arthritis ([place])    Per Patient Report; associated with OA   S/p cadaver renal transplant 1991   [facility]   Unstable angina ([place]) 5/01; 3/'02, 8/'03, 10/'04; 1/15   x 5 occurences since Inf-Post STEMI in 1994    Past Surgical History:  Procedure Laterality Date   ABDOMINAL AORTOGRAM N/A 04/21/2018   Procedure: ABDOMINAL AORTOGRAM;   Surgeon: [name], MD;  Location: [place] [facility];  Service: Cardiovascular;  Laterality: N/A;   CATHETER REMOVAL     CHOLECYSTECTOMY N/A 10/29/2014   Procedure: LAPAROSCOPIC CHOLECYSTECTOMY WITH INTRAOPERATIVE CHOLANGIOGRAM;  Surgeon: Excell Seltzer, MD;  Location: [facility];  Service: General;  Laterality: N/A;   CORONARY ANGIOPLASTY  1994   x5   CORONARY ARTERY BYPASS GRAFT  1995   LIMA-LAD, SVG-RPDA, SVG-D1   ESOPHAGOGASTRODUODENOSCOPY N/A 10/15/2016   Procedure: ESOPHAGOGASTRODUODENOSCOPY (EGD);  Surgeon: Wilford Corner, MD;  Location[facility];  Service: Endoscopy;  Laterality: N/A;  I & D EXTREMITY Right 01/29/2018   Procedure: IRRIGATION AND DEBRIDEMENT THUMB;  Surgeon: Dayna Barker, MD;  Location: [place];  Service: Plastics;  Laterality: Right;   INCISE AND DRAIN ABCESS     KIDNEY TRANSPLANT  1991   KNEE ARTHROSCOPY WITH LATERAL MENISECTOMY Left 12/03/2017   Procedure: LEFT KNEE ARTHROSCOPY WITH LATERAL MENISECTOMY;  Surgeon: Earlie Server, MD;  Location: [place];  Service: Orthopedics;  Laterality: Left;   LAPAROSCOPIC GASTRIC BANDING  04/2004; 10/'09, 2/'10   Port Replacement x 2   LEFT HEART CATH AND CORS/GRAFTS ANGIOGRAPHY N/A 04/21/2018   Procedure: LEFT HEART CATH AND CORS/GRAFTS ANGIOGRAPHY;  Surgeon: [name], MD;  Location: [place] [facility];  Ost-Prox LAD 50% - proxLAD (pre & post D1) 100% CTO. Cx - patent, small OM1 (stable ~ ostial OM1 90%, too small for PCI) & 2 small LPL; Ost-distal RCA 100% CTO.  LIMA-LAD (not injected); SVG-dRCA patent, SVG-D1 - insertion stent ~20% ISR - Severe R CFA disease w/ focal Sub TO   LEFT HEART CATH AND CORS/GRAFTS ANGIOGRAPHY  5/'01, 3/'02, 8/'03, 10/'04; 1/'15   08/22/2013: LAD & RCA 100%; LIMA-LAD & SVG-rPDA patent; Cx-- OM1 60%, OM2 ostial ~50%; SVG-D1 - 80% mid, 50% distal ISR --PCI   LEFT HEART CATH AND CORS/GRAFTS ANGIOGRAPHY N/A 01/31/2021   Procedure: LEFT HEART CATH AND CORS/GRAFTS ANGIOGRAPHY;  Surgeon: Jolaine Artist, MD;  Location: [place] [facility];  Service: Cardiovascular;  Laterality: N/A;   LEFT HEART CATHETERIZATION WITH CORONARY/GRAFT ANGIOGRAM N/A 08/23/2013   Procedure: LEFT HEART CATHETERIZATION WITH Beatrix Fetters;  Surgeon: Wellington Hampshire, MD;  Location: [place] CATH LAB;  Service: Cardiovascular;  Laterality: N/A;   Lower Extremity Arterial Dopplers  08/2013   ABI: R 0.96, L 1.04   MULTIPLE TOOTH EXTRACTIONS  age 89   NM MYOVIEW LTD  03/2016   EF 62%. LOW RISK. C/W prior MI - no Ischemia. Apical hypokinesis.   PERCUTANEOUS CORONARY STENT INTERVENTION (PCI-S)  5/'01, 3/'02, 8/'03, 10/'04;   '01 - S660 BMS 2.5 x 9 - dSVG-D1 into D1; '02- post-stent stenosis - 2.5 x 8 Pixel BMS; '8\03: ISR/Thrombosis into native D1 - AngioJet, 2.5 x 13 Pixel; '04 - ISR 95% - covered stented area with Taxus DES 2.5 mm x 20 (2.88)   PERCUTANEOUS CORONARY STENT INTERVENTION (PCI-S)  08/23/2013   Procedure: PERCUTANEOUS CORONARY STENT INTERVENTION (PCI-S);  Surgeon: Wellington Hampshire, MD;  Location: [facility] CATH LAB;  Service: Cardiovascular;;mid SVG-D1 80%; distal stent ~50% ISR; Promus [place] DES 2.75 mm xc 20 mm (2.8 mm)   PORT-A-CATH REMOVAL     kidney   TRANSTHORACIC ECHOCARDIOGRAM  07/2019   EF 55 to 60%.  No LVH.  Paradoxical septal wall motion-post CABG.  GRII DD.  Normal RV size and function.  Mild bilateral atrial dilation.  Moderate MAC.  Trace MR.  Mild aortic stenosis (gradients: Mean 14.3 mmHg -peak 24.9 mmHg).   TUBAL LIGATION     wrist fistula repair Left    dialysis for one year     Home Medications:  Prior to Admission medications   Medication Sig Start Date End Date Taking? Authorizing Provider  acetaminophen (TYLENOL) 500 MG tablet Take 1,500 mg by mouth daily.    [provider]  albuterol (VENTOLIN HFA) 108 (90 Base) MCG/ACT inhaler TAKE 2 PUFFS BY MOUTH EVERY 6 HOURS AS NEEDED FOR WHEEZE OR SHORTNESS OF BREATH 02/10/21   Collene Gobble, MD  arformoterol (BROVANA) 15 MCG/2ML  NEBU  Take 2 mLs (15 mcg total) by nebulization 2 (two) times daily. 08/30/19   Collene Gobble, MD  aspirin 81 MG EC tablet Take 1 tablet (81 mg total) by mouth daily. Swallow whole. 02/04/21   Danford, Suann Larry, MD  azaTHIOprine (IMURAN) 50 MG tablet Take 125 mg by mouth daily.     [provider]  B-D ULTRAFINE III SHORT PEN 31G X 8 MM MISC See admin instructions. 11/06/19   [provider]  Blood Glucose Monitoring Suppl (ONE TOUCH ULTRA 2) w/Device KIT 2 (two) times daily. 04/08/20   [provider]  budesonide (PULMICORT) 0.5 MG/2ML nebulizer solution Take 2 mLs (0.5 mg total) by nebulization 2 (two) times daily. 08/24/19   [name], MD  calcitRIOL (ROCALTROL) 0.25 MCG capsule Take 0.25 mcg by mouth every 3 (three) days.     [provider]  clotrimazole (MYCELEX) 10 MG troche Take 1 tablet (10 mg total) by mouth 5 (five) times daily as needed (thrush). 02/03/21   Danford, Suann Larry, MD  cyclobenzaprine (FLEXERIL) 10 MG tablet TAKE 1 TO 2 TABS BY MOUTH DAILY. TAKE 2 TAB DAILY AT BEDTIME, MAY ALSO TAKE 1 TABLET BY MOUTH AT NOON AS NEEDED FOR MUSCLE SPASMS 01/30/21   Meredith Staggers, MD  dofetilide (TIKOSYN) 250 MCG capsule Take 1 capsule (250 mcg total) by mouth 2 (two) times daily. Keep June appointment 02/10/21   Sherran Needs, NP  DULoxetine (CYMBALTA) 30 MG capsule TAKE 2 CAPSULES (60 MG TOTAL) BY MOUTH EVERY MORNING. 10/30/20   Bayard Hugger, NP  ELIQUIS 5 MG TABS tablet TAKE 1 TABLET BY MOUTH TWICE A DAY (TO REPLACE WARFARIN) 10/02/20   [name], MD  fluticasone [facility]) 50 MCG/ACT nasal spray PLACE 2 SPRAYS INTO BOTH NOSTRILS 2 TIMES DAILY 12/17/20   Collene Gobble, MD  furosemide (LASIX) 40 MG tablet Take 1 tablet (40 mg total) by mouth daily. 07/14/20   Dwyane Dee, MD  glimepiride (AMARYL) 4 MG tablet Take 4 mg by mouth daily. 02/03/20   [provider]  insulin aspart (NOVOLOG FLEXPEN) 100 UNIT/ML FlexPen Inject 14  Units into the skin 3 (three) times daily with meals.    [provider]  Insulin Glargine (BASAGLAR KWIKPEN) 100 UNIT/ML SOPN Inject 12 Units into the skin daily before breakfast.     [provider]  isosorbide mononitrate (IMDUR) 60 MG 24 hr tablet Take 1 tablet (60 mg total) by mouth daily. 02/04/21   Edwin Dada, MD  lamoTRIgine (LAMICTAL) 25 MG tablet TAKE 1 TABLET BY MOUTH EVERYDAY AT BEDTIME 02/04/21   Meredith Staggers, MD  Lancets [facility] DELICA PLUS [id number]) MISC daily. as directed 03/26/20   [provider]  LORazepam (ATIVAN) 1 MG tablet Take 1 tablet (1 mg total) by mouth 2 (two) times daily. 09/12/20   [name], MD  metFORMIN (GLUCOPHAGE) 500 MG tablet Take 500 mg by mouth 2 (two) times daily. 10/03/16   [provider]  metoprolol tartrate (LOPRESSOR) 100 MG tablet Take 1 tablet (100 mg total) by mouth 2 (two) times daily. Appointment Required For Further Refills [phone] 02/03/21   Edwin Dada, MD  metoprolol tartrate (LOPRESSOR) 50 MG tablet TAKE 1 TABLET (50 MG TOTAL) BY MOUTH 2 (TWO) TIMES DAILY. APPOINTMENT REQUIRED FOR FURTHER REFILLS 02/14/21   [name], MD  montelukast (SINGULAIR) 10 MG tablet TAKE 1 TABLET BY MOUTH EVERYDAY AT BEDTIME 02/10/21   Collene Gobble, MD  nitroGLYCERIN (NITROSTAT) 0.4 MG SL tablet Place 1 tablet (0.4 mg total) under the tongue every 5 (five) minutes as needed for chest pain. 09/11/13   [name], MD  omeprazole (PRILOSEC) 20 MG capsule Take 20 mg by mouth daily.    [provider]  [facility] ULTRA test strip USE AS DIRECTED 3 TIMES DAILY TO CHECK BLOOD SUGAR 11/04/19   [provider]  Oxycodone HCl 10 MG TABS Take 1 tablet (10 mg total) by mouth 3 (three) times daily as needed. 01/24/21   Meredith Staggers, MD  OXYGEN Inhale 3 L into the lungs continuous.    [provider]  predniSONE (DELTASONE) 5 MG tablet Take 5 mg by mouth daily. 06/19/19    [provider]  promethazine (PHENERGAN) 25 MG tablet Take 1 tablet (25 mg total) by mouth at bedtime as needed for nausea. For nausea and sleep 07/14/20   Dwyane Dee, MD  rosuvastatin (CRESTOR) 40 MG tablet Take 1 tablet (40 mg total) by mouth daily. 02/03/21   Danford, Suann Larry, MD  sertraline (ZOLOFT) 100 MG tablet Take 100 mg by mouth daily. 06/12/20   [provider]    Inpatient Medications: Scheduled Meds:  Continuous Infusions:  PRN Meds:   Allergies:    Allergies  Allergen Reactions   Tetracycline Hives    Patient tolerated Doxycycline Dec 2020   Niacin Other (See Comments)    Mouth blisters   Niaspan [Niacin Er] Other (See Comments)    Mouth blisters   Sulfa Antibiotics Nausea Only and Other (See Comments)    "Tears up stomach"   Sulfonamide Derivatives Other (See Comments)    Reaction: per patient "tears her stomach up"   Codeine Nausea And Vomiting   Erythromycin Nausea And Vomiting   Hydromorphone Hcl Nausea And Vomiting   Morphine And Related Nausea And Vomiting   Nalbuphine Nausea And Vomiting    Nubain   Sulfasalazine Nausea Only and Other (See Comments)    per patient "tears her stomach up", "Tears up stomach"   Tape Rash and Other (See Comments)    No "plastic" tape," please----cloth tape only    Social History:   Social History   Socioeconomic History   Marital status: Widowed    Spouse name: Not on file   Number of children: Not on file   Years of education: Not on file   Highest education level: Not on file  Occupational History   Not on file  Tobacco Use   Smoking status: Former    Packs/day: 1.00    Years: 30.00    Pack years: 30.00    Types: Cigarettes    Quit date: 08/17/2002    Years since quitting: 18.5   Smokeless tobacco: Never  Vaping Use   Vaping Use: Never used  Substance and Sexual Activity   Alcohol use: No   Drug use: No   Sexual activity: Not on file  Other Topics Concern   Not on file   Social History Narrative   She is currently married, and the caregiver of her husband who is recovering from surgery for tongue cancer now diagnosed with lung cancer. Prior to his diagnosis of her husband, she actually had adopted a 61-year-old child who she knows caring for as well. With all the surgeries, they have been quite financially troubled. Thanks the help of her community and church, they have been able to stay "alfoat."     She is a former smoker who quit  in 2004 after a 30-pack-year history.   She is active chasing a 40-year-old child, does not do routine exercise. She's been quite depressed with the condition of her husband, and admits to eating comfort herself.   She does not drink alcohol.   Social Determinants of Health   Financial Resource Strain: Not on file  Food Insecurity: Not on file  Transportation Needs: Not on file  Physical Activity: Not on file  Stress: Not on file  Social Connections: Not on file  Intimate Partner Violence: Not on file    Family History:    Family History  Problem Relation Age of Onset   Cancer Mother        liver   Heart disease Father    Cancer Father        colon   Arrhythmia Brother        Atrial Fibrillation   Arrhythmia Paternal Aunt        Atrial Fibrillation     ROS:  Please see the history of present illness.   All other ROS reviewed and negative.     Physical Exam/Data:   Vitals:   02/19/21 1515 02/19/21 1530 02/19/21 1545 02/19/21 1615  BP: 135/69 (!) 148/64 (!) 135/50 (!) 134/49  Pulse: 61 (!) 58 60 61  Resp: _0 SpO2: 96% 96% 96% 99%   No intake or output data in the 24 hours ending 02/19/21 1656 Last 3 Weights 02/03/2021 02/02/2021 02/01/2021  Weight (lbs) 228 lb 9.9 oz 228 lb 9.9 oz 233 lb 3.2 oz  Weight (kg) 103.7 kg 103.7 kg 105.779 kg     There is no height or weight on file to calculate BMI.  General:  Obese, older female on [place] _1  HEENT: normal Lymph: no adenopathy Neck: no JVD Endocrine:  No  thryomegaly Vascular: No carotid bruits; FA pulses 2+ bilaterally without bruits  Cardiac:  normal S1, S2; RRR; soft systolic murmur Lungs:  Diminished in bases  Abd: soft, obese, nontender, no hepatomegaly  Ext: no edema Musculoskeletal:  No deformities, BUE and BLE strength normal and equal Skin: warm and dry  Neuro:  CNs 2-12 intact, no focal abnormalities noted Psych:  Normal affect   EKG:  The EKG was personally reviewed and demonstrates:  SR with TWI inferolateral leads  Relevant CV Studies:  Cath: 01/31/21  Ost LAD to Prox LAD lesion is 50% stenosed. Prox LAD-1 lesion is 100% stenosed. Prox LAD-2 lesion is 100% stenosed. Ost RCA to Dist RCA lesion is 100% stenosed. SVG and is large. The graft exhibits minimal luminal irregularities. SVG and is large. The graft exhibits minimal luminal irregularities. LIMA. Mid Graft to Dist Graft lesion is 40% stenosed. Ost Cx to Prox Cx lesion is 60% stenosed. Dist Graft to Insertion lesion is 90% stenosed. Ost 1st Diag to 1st Diag lesion is 95% stenosed. 1st Mrg lesion is 40% stenosed. 3rd Mrg lesion is 80% stenosed.       Assessment:   1. Severe native CAD with chronic total occlusion of LAD and RCA 2. LCx with new calcified 60% ostial lesion 3. LIMA to LAD not visualized as only access was right radial 4. SVG to RCA 40% 5. SVG to D2 with recurrent high-grade 90% ISR with 95% lesion in D2 after graft insertion   Case reviewed with Drs. Burt Knack and [place]. Plan will be to intensify medical therapy. If fails medical therapy consider flow wire interrogation of ostial LCx and repeat PCI of SVG-D2.  Of note, patient has [name] AVF and known occlusion of R common femoral artery. We initially planned left femoral artery access but on u/s the left CFA was occluded with heavily plaque. We thus chose to go right radial as only access site and try to cross over to inject the LIMA but were unsuccessful. LIMA shown to be widely patent on cath  in 2015.   Katherine Bickers, MD  12:17 PM  Diagnostic Dominance: Right   Echo: 01/30/21  IMPRESSIONS     1. Left ventricular ejection fraction, by estimation, is 55 to 60%. The  left ventricle has normal function. Left ventricular endocardial border  not optimally defined to evaluate regional wall motion. There is mild left  ventricular hypertrophy. Left  ventricular diastolic parameters are consistent with Grade I diastolic  dysfunction (impaired relaxation). Elevated left atrial pressure.   2. Right ventricular systolic function is normal. The right ventricular  size is normal.   3. Left atrial size was moderately dilated.   4. The mitral valve is abnormal. Mild mitral valve regurgitation. Mild  mitral stenosis.   5. The aortic valve has an indeterminant number of cusps. There is mild  calcification of the aortic valve. There is mild thickening of the aortic  valve. Aortic valve regurgitation is not visualized. Mild aortic valve  stenosis.   Laboratory Data:  High Sensitivity Troponin:   Recent Labs  Lab 01/29/21 0030 01/29/21 0230 01/29/21 0625 01/29/21 0746 02/19/21 1307  TROPONINIHS 91* 241* 326* 305* 25*     Chemistry Recent Labs  Lab 02/19/21 1307  NA 136  K 4.4  CL 101  CO2 25  GLUCOSE 240*  BUN 14  CREATININE 0.97  CALCIUM 9.1  GFRNONAA >60  ANIONGAP 10    Recent Labs  Lab 02/19/21 1307  PROT 6.5  ALBUMIN 3.0*  AST 26  ALT 12  ALKPHOS 106  BILITOT 0.6   Hematology Recent Labs  Lab 02/19/21 1307  WBC 10.7*  RBC 3.43*  HGB 10.6*  HCT 33.1*  MCV 96.5  MCH 30.9  MCHC 32.0  RDW 16.2*  PLT 230   BNPNo results for input(s): BNP, PROBNP in the last 168 hours.  DDimer No results for input(s): DDIMER in the last 168 hours.   Radiology/Studies:  DG Chest Port 1 View  Result Date: 02/19/2021 CLINICAL DATA:  Chest pain. EXAM: PORTABLE CHEST 1 VIEW COMPARISON:  January 29, 2021. FINDINGS: Similar enlarged cardiac silhouette. Median  sternotomy and CABG. Calcific atherosclerosis of the aorta. Similar central pulmonary vascular congestion. Similar mild interstitial prominence. No consolidation. No visible pleural effusions or pneumothorax on these limited AP semi erect radiographs. IMPRESSION: 1. Similar cardiomegaly and central pulmonary vascular congestion. 2. Similar mild interstitial prominence, which could reflect mild interstitial edema versus the sequela of recurrent bouts of congestive heart failure. Electronically Signed   By: Margaretha Sheffield MD   On: 02/19/2021 14:18     Assessment and Plan:   Katherine [name] is a 65 y.o. female with a hx of CAD s/p CABG multiple PCIs, persistent atrial fib (eliquis and tikosyn), HLD, PAD, renal transplant '91, COPD and DM who is being seen 02/19/2021 for the evaluation of chest pain at the request of Dr. Ralene Bathe.  Unstable angina: presented with recurrent chest pain. Recent cath with 60% lcx lesion, high grade lesion in SVG-D2 of 90% with 95% D2 lesion after graft insertion. Failed attempt at medical therapy. Will plan to admit for cath tomorrow with atherectomy with Dr. Ellyn Hack.  Her last dose of Eliquis was this morning at 9am.  -- will hold, start IV heparin in the interim -- continue ASA, BB, Imdur, metoprolol. Will load with plavix 325m once with plans for PCI -- The patient understands that risks included but are not limited to stroke (1 in 1000), death (1 in 148, kidney failure [usually temporary] (1 in 500), bleeding (1 in 200), allergic reaction [possibly serious] (1 in 200).    Chronic combined HF: Echo last admission EF 55-60% with G3DD. Does not appear significantly volume overloaded on exam -- on BB  Paroxysmal Afib: in SR on admission -- holding Eliquis with plans for cath, start IV heparin  ESRD s/p renal transplant: Cr stable at 0.97  HLD: on crestor 466mdaily  DM: Recent Hgb A1c 9.1, hold metformin with plans for cath -- start SSI  COPD: 3L [place]  chronically  Chronic pain: continue home pain medication regimen  PVD: has known occlusion of right common femoral artery, left CFA occluded on u/s last admission. Has [name] AVF as well.   Risk Assessment/Risk Scores:      :[id number]} TIMI Risk Score for Unstable Angina or Non-ST Elevation MI:   The patient's TIMI risk score is 6, which indicates a 41% risk of all cause mortality, new or recurrent myocardial infarction or need for urgent revascularization in the next 14 days.   For questions or updates, please contact [facility] consult [email or web address] for contact info under    Signed, LiReino BellisNP  02/19/2021 4:56 PM  I have personally seen and examined this patient. I agree with the assessment and plan as outlined above.  [age]o female with CAD s/p CABG and multiple prior PCIs. Most recent admission mid June 2022 with chest pain. Cardiac cath mid June 2022 with moderately severe calcified disease in the ostium of the Circumflex artery, chronic occlusion of the LAD, presumed patent LIMA to LAD (not visualized), patent SVG to Diagonal with severe stent restenosis in the distal body of the graft, severe disease in the target Diagonal, chronic occlusion RCA and patent SVG to RCA. I have personally reviewed the cath films.  Her other history is documented above.  She has had worsened chest pain today. Troponin 25. (Her troponin was over 300 three weeks ago).  EKG without ischemic changes. I personally reviewed the EKG and is shows sinus with chronic LVH and T wave flattening.   My exam:  General: Obese female in NAD  HEENT: OP clear, mucus membranes moist  SKIN: warm, dry. No rashes.  Neuro: No focal deficits  Musculoskeletal: Muscle strength 5/5 all ext  Psychiatric: Mood and affect normal  Neck: No JVD  Lungs:Clear bilaterally, no wheezes, rhonci, crackles  Cardiovascular: Regular rate and rhythm. Systolic murmur  Abdomen:Soft. Bowel sounds present. Non-tender.   Extremities: No lower extremity edema.   Plan: CAD with unstable angina: She has obstructive disease by cath three weeks ago with potential targets for PCI in the circumflex and in the SVG to Diagonal. The Circumflex will need orbital atherectomy prior to stenting. Will hold Eliquis. (Last dose this am at 9am). Will plan cardiac cath after noon tomorrow.  Will load with Plavix tonight  NPO at midnight.   ChLauree Chandler7/01/2021 5:04 PM

## 2021-02-19 NOTE — Progress Notes (Signed)
ANTICOAGULATION CONSULT NOTE - Initial Consult  Pharmacy Consult for heparin Indication: atrial fibrillation  Allergies  Allergen Reactions   Tetracycline Hives    Patient tolerated Doxycycline Dec 2020   Niacin Other (See Comments)    Mouth blisters   Niaspan [Niacin Er] Other (See Comments)    Mouth blisters   Sulfa Antibiotics Nausea Only and Other (See Comments)    "Tears up stomach"   Sulfonamide Derivatives Other (See Comments)    Reaction: per patient "tears her stomach up"   Codeine Nausea And Vomiting   Erythromycin Nausea And Vomiting   Hydromorphone Hcl Nausea And Vomiting   Morphine And Related Nausea And Vomiting   Nalbuphine Nausea And Vomiting    Nubain   Sulfasalazine Nausea Only and Other (See Comments)    per patient "tears her stomach up", "Tears up stomach"   Tape Rash and Other (See Comments)    No "plastic" tape," please----cloth tape only    Patient Measurements:    Vital Signs: BP: 134/49 (07/06 1615) Pulse Rate: 61 (07/06 1615)  Labs: Recent Labs    02/19/21 1307  HGB 10.6*  HCT 33.1*  PLT 230  CREATININE 0.97  TROPONINIHS 25*    CrCl cannot be calculated (Unknown ideal weight.).   Medical History: Past Medical History:  Diagnosis Date   Anemia    Anxiety    Bilateral carotid artery stenosis    Carotid duplex 08/2456: 0-99% LICA, 83-38% RICA, >25% RECA, f/u 1 yr suggested   CAD (coronary artery disease) of bypass graft 5/01; 3/'02, 8/'03, 10/'04; 1/15   PCI x 5 to SVG-D1    CAD in native artery 07/1993   3 Vessel Disease (LAD-D1 & RCA) -- CABG (Dx in setting of inferior STEMI-PTCA of RCA)   CAD S/P percutaneous coronary angioplasty    PCI to SVG-D1 insertion/native D1 x 4 = '01 -(S660 BMS 2.5 x 9 anastomosis- D1); '02 - distal overlap ACS Pixel 2.5 x 8  BMS; '03 distal/native ISR/Thrombosis - Pixel 2.5 x 13; '04 - ISR-  Taxus 2.5 x 20 (covered all);; 1/15 - mid SVG-D1 (50% distal ISR) - Promus P 2.75 x 20 -- 2.8 mm   COPD mixed  type (HCC)    Followed by Dr. Lamonte Sakai "pulmonologist said no COPD"   Depression    Depression with anxiety    Diabetes mellitus type 2 in obese ([place])    Diarrhea    started after cholecystectomy and mass removed from intestine   Dyslipidemia, goal LDL below 70    08/2012: TC 137, TG 200, HDL 32!, LDL 45; on statin (followed by Dr.Deterding)   ESRD (end stage renal disease) ([place]) 1991   s/p Cadaveric Renal Transplant [facility] - Dr. Jimmy Footman)    Family history of adverse reaction to anesthesia    mom's bp dropped during/after anesthesia   Fibromyalgia    GERD (gastroesophageal reflux disease)    Glomerulonephritis, chronic, rapidly progressive 1989   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 07/1993   Rescue PTCA of RCA -- referred for CABG.   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 Servando Snare: LIMA-LAD, SVG-bifurcatingD1, SVG-rPDA   History of kidney stones    History of stroke 2012   "right eye stroke- half blind now"   Hypertension associated with diabetes ([place])    Mild aortic stenosis by prior echocardiogram 07/2019   Echo:  Mild aortic stenosis (gradients: Mean 14.3 mmHg -peak 24.9 mmHg).   Morbid obesity (HCC)    MRSA (methicillin resistant staph aureus) culture positive    OSA (obstructive sleep apnea)    no longer on CPAP or home O2, states she doesn't need now after lap band   PAD (peripheral artery disease) ([place]) 08/2013   LEA Dopplers to be read by Dr. Fletcher Anon   PAF (paroxysmal atrial fibrillation) ([place]) 06/2014   Noted on CardioNet Monitor  - --> rhythm control with Tikosyn (Dr. Rayann Heman); converted from warfarin to apixaban for anticoagulation.   Pneumonia    Recurrent boils    Bilateral Groin   Rheumatoid arthritis ([place])    Per Patient Report; associated with OA   S/p cadaver renal transplant 1991   [facility]   Unstable angina ([place]) 5/01; 3/'02, 8/'03, 10/'04; 1/15   x 5 occurences since Inf-Post STEMI  in 1994   Assessment: 22 YOF presenting with CP, hx CAD s/p CABG, hx afib on eliquis last dose 0900 today 7/6, plan for cath per cards.    Goal of Therapy:  Heparin level 0.3-0.7 units/ml Monitor platelets by anticoagulation protocol: Yes   Plan:  Heparin gtt at 1100 units/hr @2100 , no bolus F/u 6 hour aPTT/heparin level F/u cath plan and ability to transition back to [employer], PharmD Clinical Pharmacist ED Pharmacist Phone # [phone] 02/19/2021 6:16 PM

## 2021-02-19 NOTE — ED Notes (Signed)
CBG 241, RN notified

## 2021-02-19 NOTE — Telephone Encounter (Signed)
Spoke to patient. She was discharge from hospital  02/03/21. She states she is having chest pain  Scale 9/10. Patient states she did not receive prescription for NTG.  Patient wanted to know if she could increase  Metoprolol  50 mg and Imdur  60 mg to see if it relief her discomfort. She stated she has already taken 200 mg of metoprolol this morning . She states she has been having pain all morning .  RN informed patient several times with active chest pain she should call 911 and go th ER for evaluation. Patient was tearful but stated she would call.

## 2021-02-19 NOTE — ED Triage Notes (Signed)
Woke up at 0900 with CP that feels like her last MI 2 SL nitro given and 324 ASA with relief pain is now 1/10 EMS reports change to NSR in EKG after Nitro given

## 2021-02-19 NOTE — ED Provider Notes (Signed)
[facility] EMERGENCY DEPARTMENT Provider Note   CSN: [id number] Arrival date & time: 02/19/21  1244     History Chief Complaint  Patient presents with   Chest Pain    Katherine [name] is a 65 y.o. female.  The history is provided by the patient, the EMS personnel and medical records.  Chest Pain Katherine [name] is a 65 y.o. female who presents to the Emergency Department complaining of chest pain. She presents the emergency department by EMS for evaluation of central chest pain that started at 9 AM today. Pain is described as a severe pain that is constant nature. Feels like her last heart attack. She took twice her metoprolol dose as well as twice of her enter dose at this morning for the pain. She had ongoing pain and called 911. Prehospital she received one sublingual nitroglycerin with improvement but not resolution of her pain. She denies any fevers, nausea, vomiting, diarrhea. She is compliant with her home medications. She is not suicidal but was very hesitant to return to the hospital today due to being tired of living with her heart condition.    Past Medical History:  Diagnosis Date   Anemia    Anxiety    Bilateral carotid artery stenosis    Carotid duplex 09/3760: 8-31% LICA, 51-76% RICA, >16% RECA, f/u 1 yr suggested   CAD (coronary artery disease) of bypass graft 5/01; 3/'02, 8/'03, 10/'04; 1/15   PCI x 5 to SVG-D1    CAD in native artery 07/1993   3 Vessel Disease (LAD-D1 & RCA) -- CABG (Dx in setting of inferior STEMI-PTCA of RCA)   CAD S/P percutaneous coronary angioplasty    PCI to SVG-D1 insertion/native D1 x 4 = '01 -(S660 BMS 2.5 x 9 anastomosis- D1); '02 - distal overlap ACS Pixel 2.5 x 8  BMS; '03 distal/native ISR/Thrombosis - Pixel 2.5 x 13; '04 - ISR-  Taxus 2.5 x 20 (covered all);; 1/15 - mid SVG-D1 (50% distal ISR) - Promus P 2.75 x 20 -- 2.8 mm   COPD mixed type (HCC)    Followed by Dr. Lamonte Sakai "pulmonologist said no COPD"    Depression    Depression with anxiety    Diabetes mellitus type 2 in obese ([place])    Diarrhea    started after cholecystectomy and mass removed from intestine   Dyslipidemia, goal LDL below 70    08/2012: TC 137, TG 200, HDL 32!, LDL 45; on statin (followed by Dr.Deterding)   ESRD (end stage renal disease) ([place]) 1991   s/p Cadaveric Renal Transplant [facility] - Dr. Jimmy Footman)    Family history of adverse reaction to anesthesia    mom's bp dropped during/after anesthesia   Fibromyalgia    GERD (gastroesophageal reflux disease)    Glomerulonephritis, chronic, rapidly progressive 1989   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 07/1993   Rescue PTCA of RCA -- referred for CABG.   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 Servando Snare: LIMA-LAD, SVG-bifurcatingD1, SVG-rPDA   History of kidney stones    History of stroke 2012   "right eye stroke- half blind now"   Hypertension associated with diabetes ([place])    Mild aortic stenosis by prior echocardiogram 07/2019   Echo:  Mild aortic stenosis (gradients: Mean 14.3 mmHg -peak 24.9 mmHg).   Morbid obesity (HCC)    MRSA (methicillin resistant staph  aureus) culture positive    OSA (obstructive sleep apnea)    no longer on CPAP or home O2, states she doesn't need now after lap band   PAD (peripheral artery disease) ([place]) 08/2013   LEA Dopplers to be read by Dr. Fletcher Anon   PAF (paroxysmal atrial fibrillation) ([place]) 06/2014   Noted on CardioNet Monitor  - --> rhythm control with Tikosyn (Dr. Rayann Heman); converted from warfarin to apixaban for anticoagulation.   Pneumonia    Recurrent boils    Bilateral Groin   Rheumatoid arthritis ([place])    Per Patient Report; associated with OA   S/p cadaver renal transplant 1991   [facility]   Unstable angina ([place]) 5/01; 3/'02, 8/'03, 10/'04; 1/15   x 5 occurences since Inf-Post STEMI in 1994    Patient Active Problem List   Diagnosis Date Noted    Acute on chronic diastolic CHF (congestive heart failure) ([place]) 01/29/2021   Acute CHF (congestive heart failure) ([place]) 01/29/2021   Acute on chronic systolic CHF (congestive heart failure) ([place]) 01/29/2021   Non-ST elevation (NSTEMI) myocardial infarction [facility])    Chronic respiratory failure with hypoxia ([place]) 01/01/2021   Flu vaccine need 08/13/2020   Need for pneumococcal vaccination 08/13/2020   Visit for screening mammogram 08/13/2020   Candidal skin infection 08/13/2020   GAD (generalized anxiety disorder) 08/13/2020   Chronic bronchitis, mucopurulent ([place]) 08/24/2019   Current chronic use of systemic steroids 07/23/2019   OSA (obstructive sleep apnea)    Severe episode of recurrent major depressive disorder, with psychotic features ([place]) 02/16/2019   Obstructive chronic bronchitis without exacerbation ([place]) 11/03/2018   Long term (current) use of anticoagulants 04/28/2018   Renal transplant, status post 01/28/2018   Primary osteoarthritis of right knee 08/18/2017   Mild aortic stenosis by prior echocardiogram 01/28/2017   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   Deficiency anemia 10/14/2016   Normocytic anemia 10/13/2016   Fibromyalgia 03/30/2016   Other spondylosis with radiculopathy, lumbar region 03/30/2016   Type 2 diabetes mellitus with peripheral neuropathy ([place]) 03/30/2016   Paroxysmal atrial fibrillation ([place]); CHA2DS2VASc score F, HTN, CAD, CVA = 5 06/28/2014   Hypertension associated with diabetes ([place])    PAD (peripheral artery disease) ([place]) 08/17/2013   Stenosis of right carotid artery without infarction 10/08/2012   Dyslipidemia, goal LDL below 70 10/08/2012    Class: Diagnosis of   Mitral annular calcification 10/08/2012   Hypomagnesemia 06/13/2012   Renal transplant disorder 06/12/2012   Chronic allergic rhinitis 04/29/2011   Extrinsic asthma 09/09/2007   GERD 09/09/2007   COUGH, CHRONIC 09/09/2007   Morbid obesity - s/p Lap Band 9/'05 05/07/2004    Class: Diagnosis  of   Hx of CABG 09/07/1993    Class: History of   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 07/1993   Coronary artery disease involving native coronary artery of native heart with angina pectoris ([place]) 07/1993   ESRD (end stage renal disease) ([place]) 1991    Past Surgical History:  Procedure Laterality Date   ABDOMINAL AORTOGRAM N/A 04/21/2018   Procedure: ABDOMINAL AORTOGRAM;  Surgeon: [name], MD;  Location: [place] CV LAB;  Service: Cardiovascular;  Laterality: N/A;   CATHETER REMOVAL     CHOLECYSTECTOMY N/A 10/29/2014   Procedure: LAPAROSCOPIC CHOLECYSTECTOMY WITH INTRAOPERATIVE CHOLANGIOGRAM;  Surgeon: Excell Seltzer, MD;  Location: [facility] ORS;  Service: General;  Laterality: N/A;   CORONARY ANGIOPLASTY  1994   x5   CORONARY ARTERY BYPASS GRAFT  1995   LIMA-LAD, SVG-RPDA,  SVG-D1   ESOPHAGOGASTRODUODENOSCOPY N/A 10/15/2016   Procedure: ESOPHAGOGASTRODUODENOSCOPY (EGD);  Surgeon: Wilford Corner, MD;  Location: [facility] ENDOSCOPY;  Service: Endoscopy;  Laterality: N/A;   I & D EXTREMITY Right 01/29/2018   Procedure: IRRIGATION AND DEBRIDEMENT THUMB;  Surgeon: Dayna Barker, MD;  Location: [place];  Service: Plastics;  Laterality: Right;   INCISE AND DRAIN ABCESS     KIDNEY TRANSPLANT  1991   KNEE ARTHROSCOPY WITH LATERAL MENISECTOMY Left 12/03/2017   Procedure: LEFT KNEE ARTHROSCOPY WITH LATERAL MENISECTOMY;  Surgeon: Earlie Server, MD;  Location: [place];  Service: Orthopedics;  Laterality: Left;   LAPAROSCOPIC GASTRIC BANDING  04/2004; 10/'09, 2/'10   Port Replacement x 2   LEFT HEART CATH AND CORS/GRAFTS ANGIOGRAPHY N/A 04/21/2018   Procedure: LEFT HEART CATH AND CORS/GRAFTS ANGIOGRAPHY;  Surgeon: [name], MD;  Location: [place] CV LAB;  Ost-Prox LAD 50% - proxLAD (pre & post D1) 100% CTO. Cx - patent, small OM1 (stable ~ ostial OM1 90%, too small for PCI) & 2 small LPL; Ost-distal RCA 100% CTO.  LIMA-LAD (not injected); SVG-dRCA patent, SVG-D1  - insertion stent ~20% ISR - Severe R CFA disease w/ focal Sub TO   LEFT HEART CATH AND CORS/GRAFTS ANGIOGRAPHY  5/'01, 3/'02, 8/'03, 10/'04; 1/'15   08/22/2013: LAD & RCA 100%; LIMA-LAD & SVG-rPDA patent; Cx-- OM1 60%, OM2 ostial ~50%; SVG-D1 - 80% mid, 50% distal ISR --PCI   LEFT HEART CATH AND CORS/GRAFTS ANGIOGRAPHY N/A 01/31/2021   Procedure: LEFT HEART CATH AND CORS/GRAFTS ANGIOGRAPHY;  Surgeon: Jolaine Artist, MD;  Location: [place] CV LAB;  Service: Cardiovascular;  Laterality: N/A;   LEFT HEART CATHETERIZATION WITH CORONARY/GRAFT ANGIOGRAM N/A 08/23/2013   Procedure: LEFT HEART CATHETERIZATION WITH Beatrix Fetters;  Surgeon: Wellington Hampshire, MD;  Location: [place] CATH LAB;  Service: Cardiovascular;  Laterality: N/A;   Lower Extremity Arterial Dopplers  08/2013   ABI: R 0.96, L 1.04   MULTIPLE TOOTH EXTRACTIONS  age 10   NM MYOVIEW LTD  03/2016   EF 62%. LOW RISK. C/W prior MI - no Ischemia. Apical hypokinesis.   PERCUTANEOUS CORONARY STENT INTERVENTION (PCI-S)  5/'01, 3/'02, 8/'03, 10/'04;   '01 - S660 BMS 2.5 x 9 - dSVG-D1 into D1; '02- post-stent stenosis - 2.5 x 8 Pixel BMS; '8\03: ISR/Thrombosis into native D1 - AngioJet, 2.5 x 13 Pixel; '04 - ISR 95% - covered stented area with Taxus DES 2.5 mm x 20 (2.88)   PERCUTANEOUS CORONARY STENT INTERVENTION (PCI-S)  08/23/2013   Procedure: PERCUTANEOUS CORONARY STENT INTERVENTION (PCI-S);  Surgeon: Wellington Hampshire, MD;  Location: [facility] CATH LAB;  Service: Cardiovascular;;mid SVG-D1 80%; distal stent ~50% ISR; Promus [place] DES 2.75 mm xc 20 mm (2.8 mm)   PORT-A-CATH REMOVAL     kidney   TRANSTHORACIC ECHOCARDIOGRAM  07/2019   EF 55 to 60%.  No LVH.  Paradoxical septal wall motion-post CABG.  GRII DD.  Normal RV size and function.  Mild bilateral atrial dilation.  Moderate MAC.  Trace MR.  Mild aortic stenosis (gradients: Mean 14.3 mmHg -peak 24.9 mmHg).   TUBAL LIGATION     wrist fistula repair Left    dialysis for one year      OB History   No obstetric history on file.     Family History  Problem Relation Age of Onset   Cancer Mother        liver   Heart disease Father    Cancer Father  colon   Arrhythmia Brother        Atrial Fibrillation   Arrhythmia Paternal Aunt        Atrial Fibrillation    Social History   Tobacco Use   Smoking status: Former    Packs/day: 1.00    Years: 30.00    Pack years: 30.00    Types: Cigarettes    Quit date: 08/17/2002    Years since quitting: 18.5   Smokeless tobacco: Never  Vaping Use   Vaping Use: Never used  Substance Use Topics   Alcohol use: No   Drug use: No    Home Medications Prior to Admission medications   Medication Sig Start Date End Date Taking? Authorizing Provider  acetaminophen (TYLENOL) 500 MG tablet Take 1,500 mg by mouth daily.    [provider]  albuterol (VENTOLIN HFA) 108 (90 Base) MCG/ACT inhaler TAKE 2 PUFFS BY MOUTH EVERY 6 HOURS AS NEEDED FOR WHEEZE OR SHORTNESS OF BREATH 02/10/21   Collene Gobble, MD  arformoterol (BROVANA) 15 MCG/2ML NEBU Take 2 mLs (15 mcg total) by nebulization 2 (two) times daily. 08/30/19   Collene Gobble, MD  aspirin 81 MG EC tablet Take 1 tablet (81 mg total) by mouth daily. Swallow whole. 02/04/21   Danford, Suann Larry, MD  azaTHIOprine (IMURAN) 50 MG tablet Take 125 mg by mouth daily.     [provider]  B-D ULTRAFINE III SHORT PEN 31G X 8 MM MISC See admin instructions. 11/06/19   [provider]  Blood Glucose Monitoring Suppl (ONE TOUCH ULTRA 2) w/Device KIT 2 (two) times daily. 04/08/20   [provider]  budesonide (PULMICORT) 0.5 MG/2ML nebulizer solution Take 2 mLs (0.5 mg total) by nebulization 2 (two) times daily. 08/24/19   [name], MD  calcitRIOL (ROCALTROL) 0.25 MCG capsule Take 0.25 mcg by mouth every 3 (three) days.     [provider]  clotrimazole (MYCELEX) 10 MG troche Take 1 tablet (10 mg total) by mouth 5 (five) times daily as  needed (thrush). 02/03/21   Danford, Suann Larry, MD  cyclobenzaprine (FLEXERIL) 10 MG tablet TAKE 1 TO 2 TABS BY MOUTH DAILY. TAKE 2 TAB DAILY AT BEDTIME, MAY ALSO TAKE 1 TABLET BY MOUTH AT NOON AS NEEDED FOR MUSCLE SPASMS 01/30/21   Meredith Staggers, MD  dofetilide (TIKOSYN) 250 MCG capsule Take 1 capsule (250 mcg total) by mouth 2 (two) times daily. Keep June appointment 02/10/21   Sherran Needs, NP  DULoxetine (CYMBALTA) 30 MG capsule TAKE 2 CAPSULES (60 MG TOTAL) BY MOUTH EVERY MORNING. 10/30/20   Bayard Hugger, NP  ELIQUIS 5 MG TABS tablet TAKE 1 TABLET BY MOUTH TWICE A DAY (TO REPLACE WARFARIN) 10/02/20   [name], MD  fluticasone [facility]) 50 MCG/ACT nasal spray PLACE 2 SPRAYS INTO BOTH NOSTRILS 2 TIMES DAILY 12/17/20   Collene Gobble, MD  furosemide (LASIX) 40 MG tablet Take 1 tablet (40 mg total) by mouth daily. 07/14/20   Dwyane Dee, MD  glimepiride (AMARYL) 4 MG tablet Take 4 mg by mouth daily. 02/03/20   [provider]  insulin aspart (NOVOLOG FLEXPEN) 100 UNIT/ML FlexPen Inject 14 Units into the skin 3 (three) times daily with meals.    [provider]  Insulin Glargine (BASAGLAR KWIKPEN) 100 UNIT/ML SOPN Inject 12 Units into the skin daily before breakfast.     [provider]  isosorbide mononitrate (IMDUR) 60 MG 24 hr tablet Take 1 tablet (  60 mg total) by mouth daily. 02/04/21   Edwin Dada, MD  lamoTRIgine (LAMICTAL) 25 MG tablet TAKE 1 TABLET BY MOUTH EVERYDAY AT BEDTIME 02/04/21   Meredith Staggers, MD  Lancets [facility] DELICA PLUS [id number]) MISC daily. as directed 03/26/20   [provider]  LORazepam (ATIVAN) 1 MG tablet Take 1 tablet (1 mg total) by mouth 2 (two) times daily. 09/12/20   [name], MD  metFORMIN (GLUCOPHAGE) 500 MG tablet Take 500 mg by mouth 2 (two) times daily. 10/03/16   [provider]  metoprolol tartrate (LOPRESSOR) 100 MG tablet Take 1 tablet (100 mg total) by mouth 2 (two) times  daily. Appointment Required For Further Refills [phone] 02/03/21   Edwin Dada, MD  metoprolol tartrate (LOPRESSOR) 50 MG tablet TAKE 1 TABLET (50 MG TOTAL) BY MOUTH 2 (TWO) TIMES DAILY. APPOINTMENT REQUIRED FOR FURTHER REFILLS 02/14/21   [name], MD  montelukast (SINGULAIR) 10 MG tablet TAKE 1 TABLET BY MOUTH EVERYDAY AT BEDTIME 02/10/21   Collene Gobble, MD  nitroGLYCERIN (NITROSTAT) 0.4 MG SL tablet Place 1 tablet (0.4 mg total) under the tongue every 5 (five) minutes as needed for chest pain. 09/11/13   [name], MD  omeprazole (PRILOSEC) 20 MG capsule Take 20 mg by mouth daily.    [provider]  [facility] ULTRA test strip USE AS DIRECTED 3 TIMES DAILY TO CHECK BLOOD SUGAR 11/04/19   [provider]  Oxycodone HCl 10 MG TABS Take 1 tablet (10 mg total) by mouth 3 (three) times daily as needed. 01/24/21   Meredith Staggers, MD  OXYGEN Inhale 3 L into the lungs continuous.    [provider]  predniSONE (DELTASONE) 5 MG tablet Take 5 mg by mouth daily. 06/19/19   [provider]  promethazine (PHENERGAN) 25 MG tablet Take 1 tablet (25 mg total) by mouth at bedtime as needed for nausea. For nausea and sleep 07/14/20   Dwyane Dee, MD  rosuvastatin (CRESTOR) 40 MG tablet Take 1 tablet (40 mg total) by mouth daily. 02/03/21   Danford, Suann Larry, MD  sertraline (ZOLOFT) 100 MG tablet Take 100 mg by mouth daily. 06/12/20   [provider]    Allergies    Tetracycline, Niacin, Niaspan [niacin er], Sulfa antibiotics, Sulfonamide derivatives, Codeine, Erythromycin, Hydromorphone hcl, Morphine and related, Nalbuphine, Sulfasalazine, and Tape  Review of Systems   Review of Systems  Cardiovascular:  Positive for chest pain.  All other systems reviewed and are negative.  Physical Exam Updated Vital Signs BP (!) 137/53   Pulse (!) 59   Resp 18   SpO2 93%   Physical Exam Vitals and nursing note reviewed.  Constitutional:       Appearance: She is well-developed.  HENT:     Head: Normocephalic and atraumatic.  Cardiovascular:     Rate and Rhythm: Normal rate and regular rhythm.     Heart sounds: Murmur heard.  Pulmonary:     Effort: Pulmonary effort is normal. No respiratory distress.     Breath sounds: Normal breath sounds.  Abdominal:     Palpations: Abdomen is soft.     Tenderness: There is no abdominal tenderness. There is no guarding or rebound.  Musculoskeletal:        General: No tenderness.     Comments: Trace nonpitting edema to BLE  Skin:    General: Skin is warm and dry.  Neurological:     Mental Status: She is  alert and oriented to person, place, and time.  Psychiatric:        Behavior: Behavior normal.    ED Results / Procedures / Treatments   Labs (all labs ordered are listed, but only abnormal results are displayed) Labs Reviewed  COMPREHENSIVE METABOLIC PANEL - Abnormal; Notable for the following components:      Result Value   Glucose, Bld 240 (*)    Albumin 3.0 (*)    All other components within normal limits  CBC WITH DIFFERENTIAL/PLATELET - Abnormal; Notable for the following components:   WBC 10.7 (*)    RBC 3.43 (*)    Hemoglobin 10.6 (*)    HCT 33.1 (*)    RDW 16.2 (*)    Neutro Abs 8.8 (*)    All other components within normal limits  TROPONIN I (HIGH SENSITIVITY) - Abnormal; Notable for the following components:   Troponin I (High Sensitivity) 25 (*)    All other components within normal limits  TROPONIN I (HIGH SENSITIVITY)    EKG EKG Interpretation  Date/Time:  Wednesday February 19 2021 13:03:05 EDT Ventricular Rate:  62 PR Interval:  375 QRS Duration: 88 QT Interval:  449 QTC Calculation: 456 R Axis:   1 Text Interpretation: Sinus rhythm Prolonged PR interval Low voltage, precordial leads Borderline repolarization abnormality Borderline ST elevation, lateral leads Baseline wander in lead(s) I III aVR aVL aVF Poor data quality Confirmed by Quintella Reichert  [phone]) on 02/19/2021 2:03:44 PM  Radiology DG Chest Port 1 View  Result Date: 02/19/2021 CLINICAL DATA:  Chest pain. EXAM: PORTABLE CHEST 1 VIEW COMPARISON:  January 29, 2021. FINDINGS: Similar enlarged cardiac silhouette. Median sternotomy and CABG. Calcific atherosclerosis of the aorta. Similar central pulmonary vascular congestion. Similar mild interstitial prominence. No consolidation. No visible pleural effusions or pneumothorax on these limited AP semi erect radiographs. IMPRESSION: 1. Similar cardiomegaly and central pulmonary vascular congestion. 2. Similar mild interstitial prominence, which could reflect mild interstitial edema versus the sequela of recurrent bouts of congestive heart failure. Electronically Signed   By: Margaretha Sheffield MD   On: 02/19/2021 14:18    Procedures Procedures   Medications Ordered in ED Medications - No data to display  ED Course  I have reviewed the triage vital signs and the nursing notes.  Pertinent labs & imaging results that were available during my care of the patient were reviewed by me and considered in my medical decision making (see chart for details).    MDM Rules/Calculators/A&P                         patient with recent and STEMI here for evaluation of recurrent chest pain. She did take an extra dose of En[place] as well as metoprolol today. Troponin is minimally elevated here today. Cardiology consulted regarding further recommendations for management of her symptoms.  Final Clinical Impression(s) / ED Diagnoses Final diagnoses:  None    Rx / DC Orders ED Discharge Orders     None        Quintella Reichert, MD 02/19/21 1530

## 2021-02-20 ENCOUNTER — Encounter (HOSPITAL_COMMUNITY)
Admission: EM | Disposition: A | Discharge status: Home / Self Care | Payer: Self-pay | Source: Home / Self Care | Attending: Cardiovascular Disease

## 2021-02-20 DIAGNOSIS — I70201 Unspecified atherosclerosis of native arteries of extremities, right leg: Secondary | ICD-10-CM | POA: Diagnosis present

## 2021-02-20 DIAGNOSIS — K219 Gastro-esophageal reflux disease without esophagitis: Secondary | ICD-10-CM | POA: Diagnosis present

## 2021-02-20 DIAGNOSIS — E1142 Type 2 diabetes mellitus with diabetic polyneuropathy: Secondary | ICD-10-CM | POA: Diagnosis not present

## 2021-02-20 DIAGNOSIS — J449 Chronic obstructive pulmonary disease, unspecified: Secondary | ICD-10-CM | POA: Diagnosis not present

## 2021-02-20 DIAGNOSIS — I252 Old myocardial infarction: Secondary | ICD-10-CM | POA: Diagnosis not present

## 2021-02-20 DIAGNOSIS — M797 Fibromyalgia: Secondary | ICD-10-CM | POA: Diagnosis present

## 2021-02-20 DIAGNOSIS — I25110 Atherosclerotic heart disease of native coronary artery with unstable angina pectoris: Secondary | ICD-10-CM | POA: Diagnosis not present

## 2021-02-20 DIAGNOSIS — Z87442 Personal history of urinary calculi: Secondary | ICD-10-CM | POA: Diagnosis not present

## 2021-02-20 DIAGNOSIS — E1169 Type 2 diabetes mellitus with other specified complication: Secondary | ICD-10-CM | POA: Diagnosis not present

## 2021-02-20 DIAGNOSIS — I6523 Occlusion and stenosis of bilateral carotid arteries: Secondary | ICD-10-CM | POA: Diagnosis not present

## 2021-02-20 DIAGNOSIS — Z951 Presence of aortocoronary bypass graft: Secondary | ICD-10-CM | POA: Diagnosis not present

## 2021-02-20 DIAGNOSIS — E785 Hyperlipidemia, unspecified: Secondary | ICD-10-CM | POA: Diagnosis present

## 2021-02-20 DIAGNOSIS — R079 Chest pain, unspecified: Secondary | ICD-10-CM | POA: Diagnosis not present

## 2021-02-20 DIAGNOSIS — T82855S Stenosis of coronary artery stent, sequela: Secondary | ICD-10-CM

## 2021-02-20 DIAGNOSIS — F419 Anxiety disorder, unspecified: Secondary | ICD-10-CM | POA: Diagnosis present

## 2021-02-20 DIAGNOSIS — I152 Hypertension secondary to endocrine disorders: Secondary | ICD-10-CM | POA: Diagnosis not present

## 2021-02-20 DIAGNOSIS — E1122 Type 2 diabetes mellitus with diabetic chronic kidney disease: Secondary | ICD-10-CM | POA: Diagnosis not present

## 2021-02-20 DIAGNOSIS — Z20822 Contact with and (suspected) exposure to covid-19: Secondary | ICD-10-CM | POA: Diagnosis not present

## 2021-02-20 DIAGNOSIS — M069 Rheumatoid arthritis, unspecified: Secondary | ICD-10-CM | POA: Diagnosis present

## 2021-02-20 DIAGNOSIS — I358 Other nonrheumatic aortic valve disorders: Secondary | ICD-10-CM | POA: Diagnosis present

## 2021-02-20 DIAGNOSIS — Z940 Kidney transplant status: Secondary | ICD-10-CM | POA: Diagnosis not present

## 2021-02-20 DIAGNOSIS — T82855A Stenosis of coronary artery stent, initial encounter: Secondary | ICD-10-CM

## 2021-02-20 DIAGNOSIS — F32A Depression, unspecified: Secondary | ICD-10-CM | POA: Diagnosis not present

## 2021-02-20 DIAGNOSIS — I4819 Other persistent atrial fibrillation: Secondary | ICD-10-CM | POA: Diagnosis not present

## 2021-02-20 DIAGNOSIS — Z8673 Personal history of transient ischemic attack (TIA), and cerebral infarction without residual deficits: Secondary | ICD-10-CM | POA: Diagnosis not present

## 2021-02-20 DIAGNOSIS — Z79899 Other long term (current) drug therapy: Secondary | ICD-10-CM | POA: Diagnosis not present

## 2021-02-20 HISTORY — PX: CORONARY STENT INTERVENTION: CATH118234

## 2021-02-20 HISTORY — PX: CORONARY PRESSURE/FFR STUDY: CATH118243

## 2021-02-20 HISTORY — PX: LEFT HEART CATH AND CORONARY ANGIOGRAPHY: CATH118249

## 2021-02-20 LAB — GLUCOSE, CAPILLARY
Glucose-Capillary: 137 mg/dL — ABNORMAL HIGH (ref 70–99)
Glucose-Capillary: 128 mg/dL — ABNORMAL HIGH (ref 70–99)
Glucose-Capillary: 120 mg/dL — ABNORMAL HIGH (ref 70–99)
Glucose-Capillary: 279 mg/dL — ABNORMAL HIGH (ref 70–99)
Glucose-Capillary: 212 mg/dL — ABNORMAL HIGH (ref 70–99)

## 2021-02-20 LAB — LIPID PANEL
Cholesterol: 99 mg/dL (ref 0–200)
HDL: 51 mg/dL
LDL Cholesterol: 15 mg/dL (ref 0–99)
Total CHOL/HDL Ratio: 1.9 ratio
Triglycerides: 165 mg/dL — ABNORMAL HIGH
VLDL: 33 mg/dL (ref 0–40)

## 2021-02-20 LAB — HEPARIN LEVEL (UNFRACTIONATED)
Heparin Unfractionated: 0.93 IU/mL — ABNORMAL HIGH (ref 0.30–0.70)
Heparin Unfractionated: >1.1 IU/mL — ABNORMAL HIGH (ref 0.30–0.70)

## 2021-02-20 LAB — BASIC METABOLIC PANEL WITH GFR
Anion gap: 8 (ref 5–15)
BUN: 13 mg/dL (ref 8–23)
CO2: 26 mmol/L (ref 22–32)
Calcium: 9 mg/dL (ref 8.9–10.3)
Chloride: 103 mmol/L (ref 98–111)
Creatinine, Ser: 0.81 mg/dL (ref 0.44–1.00)
GFR, Estimated: >60 mL/min
Glucose, Bld: 199 mg/dL — ABNORMAL HIGH (ref 70–99)
Potassium: 3.8 mmol/L (ref 3.5–5.1)
Sodium: 137 mmol/L (ref 135–145)

## 2021-02-20 LAB — CBC
HCT: 32.3 % — ABNORMAL LOW (ref 36.0–46.0)
Hemoglobin: 10.3 g/dL — ABNORMAL LOW (ref 12.0–15.0)
MCH: 30.9 pg (ref 26.0–34.0)
MCHC: 31.9 g/dL (ref 30.0–36.0)
MCV: 97 fL (ref 80.0–100.0)
Platelets: 194 10*3/uL (ref 150–400)
RBC: 3.33 MIL/uL — ABNORMAL LOW (ref 3.87–5.11)
RDW: 16 % — ABNORMAL HIGH (ref 11.5–15.5)
WBC: 6.6 10*3/uL (ref 4.0–10.5)
nRBC: 0 % (ref 0.0–0.2)

## 2021-02-20 LAB — POCT ACTIVATED CLOTTING TIME
Activated Clotting Time: 416 seconds
Activated Clotting Time: 352 seconds

## 2021-02-20 LAB — APTT
aPTT: 50 seconds — ABNORMAL HIGH (ref 24–36)
aPTT: >200 seconds (ref 24–36)

## 2021-02-20 LAB — SARS CORONAVIRUS 2 BY RT PCR (HOSPITAL ORDER, PERFORMED IN ~~LOC~~ HOSPITAL LAB): SARS Coronavirus 2: NEGATIVE

## 2021-02-20 SURGERY — INTRAVASCULAR PRESSURE WIRE/FFR STUDY
Anesthesia: LOCAL

## 2021-02-20 MED ORDER — SODIUM CHLORIDE 0.9% FLUSH: 3.0000 mL | INTRAVENOUS | Status: DC | PRN

## 2021-02-20 MED ORDER — APIXABAN 5 MG PO TABS
5.0000 mg | ORAL_TABLET | Freq: Two times a day (BID) | ORAL | Status: DC
  Administered 2021-02-21: 5 mg via ORAL
  Filled 2021-02-20: qty 1

## 2021-02-20 MED ORDER — LAMOTRIGINE 25 MG PO TABS
25.0000 mg | ORAL_TABLET | Freq: Every day | ORAL | Status: DC
  Administered 2021-02-20: 25 mg via ORAL
  Filled 2021-02-20 (×3): qty 1

## 2021-02-20 MED ORDER — SODIUM CHLORIDE 0.9 % WEIGHT BASED INFUSION
3.0000 mL/kg/h | INTRAVENOUS | Status: DC
  Administered 2021-02-20: 3 mL/kg/h via INTRAVENOUS

## 2021-02-20 MED ORDER — SODIUM CHLORIDE 0.9% FLUSH
3.0000 mL | Freq: Two times a day (BID) | INTRAVENOUS | Status: DC
  Administered 2021-02-20 – 2021-02-21 (×2): 3 mL via INTRAVENOUS

## 2021-02-20 MED ORDER — FENTANYL CITRATE (PF) 100 MCG/2ML IJ SOLN
INTRAMUSCULAR | Status: AC
  Filled 2021-02-20: qty 2

## 2021-02-20 MED ORDER — LABETALOL HCL 5 MG/ML IV SOLN: 10.0000 mg | INTRAVENOUS | Status: AC | PRN

## 2021-02-20 MED ORDER — FENTANYL CITRATE (PF) 100 MCG/2ML IJ SOLN
INTRAMUSCULAR | Status: DC | PRN
  Administered 2021-02-20 (×3): 25 ug via INTRAVENOUS
  Administered 2021-02-20: 50 ug via INTRAVENOUS
  Administered 2021-02-20: 25 ug via INTRAVENOUS

## 2021-02-20 MED ORDER — SODIUM CHLORIDE 0.9 % IV SOLN: 250.0000 mL | INTRAVENOUS | Status: DC | PRN

## 2021-02-20 MED ORDER — HYDRALAZINE HCL 20 MG/ML IJ SOLN: 10.0000 mg | INTRAMUSCULAR | Status: AC | PRN

## 2021-02-20 MED ORDER — HEPARIN (PORCINE) IN NACL 1000-0.9 UT/500ML-% IV SOLN
INTRAVENOUS | Status: AC
  Filled 2021-02-20: qty 500

## 2021-02-20 MED ORDER — IOHEXOL 350 MG/ML SOLN
INTRAVENOUS | Status: DC | PRN
  Administered 2021-02-20: 115 mL

## 2021-02-20 MED ORDER — MIDAZOLAM HCL 2 MG/2ML IJ SOLN
INTRAMUSCULAR | Status: AC
  Filled 2021-02-20: qty 2

## 2021-02-20 MED ORDER — ASPIRIN 81 MG PO CHEW
81.0000 mg | CHEWABLE_TABLET | ORAL | Status: AC
Start: 2021-02-20 — End: 2021-02-20
  Administered 2021-02-20: 81 mg via ORAL
  Filled 2021-02-20: qty 1

## 2021-02-20 MED ORDER — LIDOCAINE HCL (PF) 1 % IJ SOLN
INTRAMUSCULAR | Status: AC
  Filled 2021-02-20: qty 30

## 2021-02-20 MED ORDER — LIDOCAINE HCL (PF) 1 % IJ SOLN
INTRAMUSCULAR | Status: DC | PRN
  Administered 2021-02-20: 4 mL

## 2021-02-20 MED ORDER — CLOPIDOGREL BISULFATE 75 MG PO TABS
75.0000 mg | ORAL_TABLET | ORAL | Status: AC
  Administered 2021-02-20: 75 mg via ORAL
  Filled 2021-02-20: qty 1

## 2021-02-20 MED ORDER — MIDAZOLAM HCL 2 MG/2ML IJ SOLN
INTRAMUSCULAR | Status: DC | PRN
  Administered 2021-02-20 (×4): 1 mg via INTRAVENOUS

## 2021-02-20 MED ORDER — NITROGLYCERIN 1 MG/10 ML FOR IR/CATH LAB
INTRA_ARTERIAL | Status: AC
  Filled 2021-02-20: qty 10

## 2021-02-20 MED ORDER — SODIUM CHLORIDE 0.9 % IV SOLN: INTRAVENOUS | Status: AC

## 2021-02-20 MED ORDER — HEPARIN (PORCINE) IN NACL 1000-0.9 UT/500ML-% IV SOLN
INTRAVENOUS | Status: DC | PRN
  Administered 2021-02-20: 500 mL

## 2021-02-20 MED ORDER — SODIUM CHLORIDE 0.9 % WEIGHT BASED INFUSION
1.0000 mL/kg/h | INTRAVENOUS | Status: DC
  Administered 2021-02-20: 1 mL/kg/h via INTRAVENOUS

## 2021-02-20 MED ORDER — HEPARIN SODIUM (PORCINE) 1000 UNIT/ML IJ SOLN
INTRAMUSCULAR | Status: DC | PRN
  Administered 2021-02-20: 10000 [IU] via INTRAVENOUS

## 2021-02-20 MED ORDER — AZATHIOPRINE 50 MG PO TABS
125.0000 mg | ORAL_TABLET | Freq: Every day | ORAL | Status: DC
  Administered 2021-02-21: 125 mg via ORAL
  Filled 2021-02-20 (×2): qty 3

## 2021-02-20 MED ORDER — CLOPIDOGREL BISULFATE 75 MG PO TABS
75.0000 mg | ORAL_TABLET | Freq: Every day | ORAL | Status: DC
  Administered 2021-02-20 – 2021-02-21 (×2): 75 mg via ORAL
  Filled 2021-02-20 (×2): qty 1

## 2021-02-20 MED ORDER — VERAPAMIL HCL 2.5 MG/ML IV SOLN
INTRAVENOUS | Status: AC
  Filled 2021-02-20: qty 2

## 2021-02-20 MED ORDER — POTASSIUM CHLORIDE CRYS ER 20 MEQ PO TBCR: 20.0000 meq | EXTENDED_RELEASE_TABLET | Freq: Once | ORAL | Status: DC

## 2021-02-20 MED ORDER — VERAPAMIL HCL 2.5 MG/ML IV SOLN
INTRAVENOUS | Status: DC | PRN
  Administered 2021-02-20: 10 mL via INTRA_ARTERIAL

## 2021-02-20 MED ORDER — DULOXETINE HCL 60 MG PO CPEP
60.0000 mg | ORAL_CAPSULE | Freq: Every day | ORAL | Status: DC
  Administered 2021-02-21: 60 mg via ORAL
  Filled 2021-02-20: qty 1

## 2021-02-20 MED ORDER — HEPARIN SODIUM (PORCINE) 1000 UNIT/ML IJ SOLN
INTRAMUSCULAR | Status: AC
  Filled 2021-02-20: qty 1

## 2021-02-20 SURGICAL SUPPLY — 23 items
BALLN SAPPHIRE 2.0X12 (BALLOONS) ×2
BALLN SAPPHIRE ~~LOC~~ 2.5X12 (BALLOONS) ×1 IMPLANT
BALLN SAPPHIRE ~~LOC~~ 2.75X8 (BALLOONS) ×1 IMPLANT
BALLN SCOREFLEX 2.50X10 (BALLOONS) ×2
BALLOON SAPPHIRE 2.0X12 (BALLOONS) IMPLANT
BALLOON SCOREFLEX 2.50X10 (BALLOONS) IMPLANT
CATH TELESCOPE 6F GEC (CATHETERS) ×1 IMPLANT
CATH VISTA GUIDE 6FR AL1 (CATHETERS) ×1 IMPLANT
CATH VISTA GUIDE 6FR XBLAD3.5 (CATHETERS) ×1 IMPLANT
DEVICE RAD COMP TR BAND LRG (VASCULAR PRODUCTS) ×2 IMPLANT
GLIDESHEATH SLEND SS 6F .021 (SHEATH) ×1 IMPLANT
GUIDEWIRE PRESSURE X 175 (WIRE) ×1 IMPLANT
KIT ENCORE 26 ADVANTAGE (KITS) ×1 IMPLANT
KIT HEART LEFT (KITS) ×2 IMPLANT
MAT PREVALON FULL STRYKER (MISCELLANEOUS) ×1 IMPLANT
PACK CARDIAC CATHETERIZATION (CUSTOM PROCEDURE TRAY) ×2 IMPLANT
SHEATH PROBE COVER 6X72 (BAG) ×1 IMPLANT
STENT ONYX FRONTIER 2.25X12 (Permanent Stent) ×1 IMPLANT
TRANSDUCER W/STOPCOCK (MISCELLANEOUS) ×2 IMPLANT
TUBING CIL FLEX 10 FLL-RA (TUBING) ×2 IMPLANT
WIRE ASAHI PROWATER 180CM (WIRE) ×1 IMPLANT
WIRE EMERALD 3MM-J .035X150CM (WIRE) ×1 IMPLANT
WIRE HI TORQ VERSACORE J 260CM (WIRE) ×1 IMPLANT

## 2021-02-20 NOTE — Progress Notes (Signed)
Progress Note  Patient Name: Katherine [name] Date of Encounter: 02/20/2021  [facility] Cardiologist: [name], MD   Subjective   No chest pain overnight.   Inpatient Medications    Scheduled Meds:  aspirin  324 mg Oral NOW   Or   aspirin  300 mg Rectal NOW   aspirin EC  81 mg Oral Daily   dofetilide  250 mcg Oral BID   furosemide  40 mg Oral Daily   insulin aspart  0-15 Units Subcutaneous TID WC   isosorbide mononitrate  60 mg Oral Daily   LORazepam  1 mg Oral BID   metoprolol tartrate  100 mg Oral BID   predniSONE  5 mg Oral Daily   rosuvastatin  40 mg Oral Daily   sertraline  100 mg Oral Daily   Continuous Infusions:  sodium chloride 1 mL/kg/hr (02/20/21 0703)   heparin 1,200 Units/hr (02/20/21 0451)   PRN Meds: acetaminophen, nitroGLYCERIN, ondansetron (ZOFRAN) IV, oxyCODONE, promethazine   Vital Signs    Vitals:   02/20/21 0400 02/20/21 0458 02/20/21 0535 02/20/21 0817  BP:   (!) 162/75 95/64  Pulse:   64 69  Resp:   18 18  Temp:   98.6 F (37 C) 98.4 F (36.9 C)  TempSrc:   Oral Oral  SpO2:   99% 97%  Weight: 103 kg 105.8 kg    Height:        Intake/Output Summary (Last 24 hours) at 02/20/2021 0842 Last data filed at 02/20/2021 2778 Gross per 24 hour  Intake 237.79 ml  Output 400 ml  Net -162.21 ml   Last 3 Weights 02/20/2021 02/20/2021 02/03/2021  Weight (lbs) 233 lb 4 oz 227 lb 1.2 oz 228 lb 9.9 oz  Weight (kg) 105.8 kg 103 kg 103.7 kg      Telemetry    sinus- Personally Reviewed  ECG   No AM EKG  Physical Exam   GEN: No acute distress.   Neck: No JVD Cardiac: RRR, no murmurs, rubs, or gallops.  Respiratory: Clear to auscultation bilaterally. GI: Soft, nontender, non-distended  MS: No edema; No deformity. Neuro:  Nonfocal  Psych: Normal affect   Labs    High Sensitivity Troponin:   Recent Labs  Lab 01/29/21 0230 01/29/21 0625 01/29/21 0746 02/19/21 1307 02/19/21 1524  TROPONINIHS 241* 326* 305* 25* 42*       Chemistry Recent Labs  Lab 02/19/21 1307 02/20/21 0125  NA 136 137  K 4.4 3.8  CL 101 103  CO2 25 26  GLUCOSE 240* 199*  BUN 14 13  CREATININE 0.97 0.81  CALCIUM 9.1 9.0  PROT 6.5  --   ALBUMIN 3.0*  --   AST 26  --   ALT 12  --   ALKPHOS 106  --   BILITOT 0.6  --   GFRNONAA >60 >60  ANIONGAP 10 8     Hematology Recent Labs  Lab 02/19/21 1307 02/20/21 0125  WBC 10.7* 6.6  RBC 3.43* 3.33*  HGB 10.6* 10.3*  HCT 33.1* 32.3*  MCV 96.5 97.0  MCH 30.9 30.9  MCHC 32.0 31.9  RDW 16.2* 16.0*  PLT 230 194    BNPNo results for input(s): BNP, PROBNP in the last 168 hours.   DDimer No results for input(s): DDIMER in the last 168 hours.   Radiology    DG Chest Port 1 View  Result Date: 02/19/2021 CLINICAL DATA:  Chest pain. EXAM: PORTABLE CHEST 1 VIEW COMPARISON:  January 29, 2021. FINDINGS: Similar enlarged cardiac silhouette. Median sternotomy and CABG. Calcific atherosclerosis of the aorta. Similar central pulmonary vascular congestion. Similar mild interstitial prominence. No consolidation. No visible pleural effusions or pneumothorax on these limited AP semi erect radiographs. IMPRESSION: 1. Similar cardiomegaly and central pulmonary vascular congestion. 2. Similar mild interstitial prominence, which could reflect mild interstitial edema versus the sequela of recurrent bouts of congestive heart failure. Electronically Signed   By: Margaretha Sheffield MD   On: 02/19/2021 14:18    Cardiac Studies     Patient Profile    65 yo female with CAD s/p CABG and multiple prior PCIs. Most recent admission mid June 2022 with chest pain. Cardiac cath mid June 2022 with moderately severe calcified disease in the ostium of the Circumflex artery, chronic occlusion of the LAD, presumed patent LIMA to LAD (not visualized), patent SVG to Diagonal with severe stent restenosis in the distal body of the graft, severe disease in the target Diagonal, chronic occlusion RCA and patent SVG to RCA.  Admitted with worsened chest pain c/w unstable angina. Troponin 25>>42. (Her troponin was over 300 three weeks ago). EKG without ischemic changes.  Assessment & Plan    CAD with unstable angina: Mild troponin elevation. No ischemic EKG changes. As above, severe Circumflex and SVG to Diagonal lesions on cath 3 weeks ago. Plan for repeat cardiac cath today with likely PCI. She has been loaded with Plavix. Eliquis held. She is NPO for cath.  2.    PAF: sinus today. Eliquis on hold for cath 3.   ESRD s/p renal transplant: renal function stable 4.   DM: Metformin on hold for cath   For questions or updates, please contact [place] Please consult [email or web address] for contact info under        Signed, Lauree Chandler, MD  02/20/2021, 8:42 AM

## 2021-02-20 NOTE — H&P (View-Only) (Signed)
Progress Note  Patient Name: Desarai Barrack Date of Encounter: 02/20/2021  [facility] Cardiologist: [name], MD   Subjective   No chest pain overnight.   Inpatient Medications    Scheduled Meds:  aspirin  324 mg Oral NOW   Or   aspirin  300 mg Rectal NOW   aspirin EC  81 mg Oral Daily   dofetilide  250 mcg Oral BID   furosemide  40 mg Oral Daily   insulin aspart  0-15 Units Subcutaneous TID WC   isosorbide mononitrate  60 mg Oral Daily   LORazepam  1 mg Oral BID   metoprolol tartrate  100 mg Oral BID   predniSONE  5 mg Oral Daily   rosuvastatin  40 mg Oral Daily   sertraline  100 mg Oral Daily   Continuous Infusions:  sodium chloride 1 mL/kg/hr (02/20/21 0703)   heparin 1,200 Units/hr (02/20/21 0451)   PRN Meds: acetaminophen, nitroGLYCERIN, ondansetron (ZOFRAN) IV, oxyCODONE, promethazine   Vital Signs    Vitals:   02/20/21 0400 02/20/21 0458 02/20/21 0535 02/20/21 0817  BP:   (!) 162/75 95/64  Pulse:   64 69  Resp:   18 18  Temp:   98.6 F (37 C) 98.4 F (36.9 C)  TempSrc:   Oral Oral  SpO2:   99% 97%  Weight: 103 kg 105.8 kg    Height:        Intake/Output Summary (Last 24 hours) at 02/20/2021 0842 Last data filed at 02/20/2021 2122 Gross per 24 hour  Intake 237.79 ml  Output 400 ml  Net -162.21 ml   Last 3 Weights 02/20/2021 02/20/2021 02/03/2021  Weight (lbs) 233 lb 4 oz 227 lb 1.2 oz 228 lb 9.9 oz  Weight (kg) 105.8 kg 103 kg 103.7 kg      Telemetry    sinus- Personally Reviewed  ECG   No AM EKG  Physical Exam   GEN: No acute distress.   Neck: No JVD Cardiac: RRR, no murmurs, rubs, or gallops.  Respiratory: Clear to auscultation bilaterally. GI: Soft, nontender, non-distended  MS: No edema; No deformity. Neuro:  Nonfocal  Psych: Normal affect   Labs    High Sensitivity Troponin:   Recent Labs  Lab 01/29/21 0230 01/29/21 0625 01/29/21 0746 02/19/21 1307 02/19/21 1524  TROPONINIHS 241* 326* 305* 25* 42*       Chemistry Recent Labs  Lab 02/19/21 1307 02/20/21 0125  NA 136 137  K 4.4 3.8  CL 101 103  CO2 25 26  GLUCOSE 240* 199*  BUN 14 13  CREATININE 0.97 0.81  CALCIUM 9.1 9.0  PROT 6.5  --   ALBUMIN 3.0*  --   AST 26  --   ALT 12  --   ALKPHOS 106  --   BILITOT 0.6  --   GFRNONAA >60 >60  ANIONGAP 10 8     Hematology Recent Labs  Lab 02/19/21 1307 02/20/21 0125  WBC 10.7* 6.6  RBC 3.43* 3.33*  HGB 10.6* 10.3*  HCT 33.1* 32.3*  MCV 96.5 97.0  MCH 30.9 30.9  MCHC 32.0 31.9  RDW 16.2* 16.0*  PLT 230 194    BNPNo results for input(s): BNP, PROBNP in the last 168 hours.   DDimer No results for input(s): DDIMER in the last 168 hours.   Radiology    DG Chest Port 1 View  Result Date: 02/19/2021 CLINICAL DATA:  Chest pain. EXAM: PORTABLE CHEST 1 VIEW COMPARISON:  January 29, 2021. FINDINGS: Similar enlarged cardiac silhouette. Median sternotomy and CABG. Calcific atherosclerosis of the aorta. Similar central pulmonary vascular congestion. Similar mild interstitial prominence. No consolidation. No visible pleural effusions or pneumothorax on these limited AP semi erect radiographs. IMPRESSION: 1. Similar cardiomegaly and central pulmonary vascular congestion. 2. Similar mild interstitial prominence, which could reflect mild interstitial edema versus the sequela of recurrent bouts of congestive heart failure. Electronically Signed   By: Margaretha Sheffield MD   On: 02/19/2021 14:18    Cardiac Studies     Patient Profile    65 yo female with CAD s/p CABG and multiple prior PCIs. Most recent admission mid June 2022 with chest pain. Cardiac cath mid June 2022 with moderately severe calcified disease in the ostium of the Circumflex artery, chronic occlusion of the LAD, presumed patent LIMA to LAD (not visualized), patent SVG to Diagonal with severe stent restenosis in the distal body of the graft, severe disease in the target Diagonal, chronic occlusion RCA and patent SVG to RCA.  Admitted with worsened chest pain c/w unstable angina. Troponin 25>>42. (Her troponin was over 300 three weeks ago). EKG without ischemic changes.  Assessment & Plan    CAD with unstable angina: Mild troponin elevation. No ischemic EKG changes. As above, severe Circumflex and SVG to Diagonal lesions on cath 3 weeks ago. Plan for repeat cardiac cath today with likely PCI. She has been loaded with Plavix. Eliquis held. She is NPO for cath.  2.    PAF: sinus today. Eliquis on hold for cath 3.   ESRD s/p renal transplant: renal function stable 4.   DM: Metformin on hold for cath   For questions or updates, please contact [place] Please consult [email or web address] for contact info under        Signed, Lauree Chandler, MD  02/20/2021, 8:42 AM

## 2021-02-20 NOTE — Interval H&P Note (Signed)
History and Physical Interval Note:  02/20/2021 2:11 PM  Katherine [name]  has presented today for surgery, with the diagnosis of cad.  The various methods of treatment have been discussed with the patient and family. After consideration of risks, benefits and other options for treatment, the patient has consented to  Procedure(s): LEFT HEART CATH AND CORS/GRAFTS ANGIOGRAPHY (N/A) CORONARY ATHERECTOMY (N/A)  PERCUTANEOUS CORONARY INTERVENTION  as a surgical intervention.  The patient's history has been reviewed, patient examined, no change in status, stable for surgery.  I have reviewed the patient's chart and labs.  Questions were answered to the patient's satisfaction.    Cath Lab Visit (complete for each Cath Lab visit)  Clinical Evaluation Leading to the Procedure:   ACS: Yes.    Non-ACS:    Anginal Classification: CCS IV  Anti-ischemic medical therapy: Maximal Therapy (2 or more classes of medications) - max tolerated  Non-Invasive Test Results: Equivocal test results -> recent cardiac catheterization revealed several potential culprit lesions.  Plan was medical management which she has now failed.  Prior CABG: Previous CABG    Katherine [name]

## 2021-02-20 NOTE — Progress Notes (Signed)
Date and time results received: 02/20/21 2030.  Test: APTT Critical Value: >200  Name of Provider Notified:Dr. Channing Mutters  Orders Received? Or Actions Taken?: Held Eliquis tonight and will give it tomorrow morning

## 2021-02-20 NOTE — Progress Notes (Signed)
[place] for heparin Indication: atrial fibrillation  Allergies  Allergen Reactions   Tetracycline Hives    Patient tolerated Doxycycline Dec 2020   Niacin Other (See Comments)    Mouth blisters   Niaspan [Niacin Er] Other (See Comments)    Mouth blisters   Sulfa Antibiotics Nausea Only and Other (See Comments)    "Tears up stomach"   Sulfonamide Derivatives Other (See Comments)    Reaction: per patient "tears her stomach up"   Codeine Nausea And Vomiting   Erythromycin Nausea And Vomiting   Hydromorphone Hcl Nausea And Vomiting   Morphine And Related Nausea And Vomiting   Nalbuphine Nausea And Vomiting    Nubain   Sulfasalazine Nausea Only and Other (See Comments)    per patient "tears her stomach up", "Tears up stomach"   Tape Rash and Other (See Comments)    No "plastic" tape," please----cloth tape only    Patient Measurements:    Vital Signs: Temp: 98.1 F (36.7 C) (07/06 2216) Temp Source: Oral (07/06 2216) BP: 143/63 (07/06 2216) Pulse Rate: 56 (07/06 2216)  Labs: Recent Labs    02/19/21 1307 02/19/21 1524 02/20/21 0125  HGB 10.6*  --  10.3*  HCT 33.1*  --  32.3*  PLT 230  --  194  APTT  --   --  50*  HEPARINUNFRC  --   --  0.93*  CREATININE 0.97  --  0.81  TROPONINIHS 25* 42*  --      CrCl cannot be calculated (Unknown ideal weight.).   Medical History: Past Medical History:  Diagnosis Date   Anemia    Anxiety    Bilateral carotid artery stenosis    Carotid duplex 08/6107: 6-04% LICA, 54-09% RICA, >81% RECA, f/u 1 yr suggested   CAD (coronary artery disease) of bypass graft 5/01; 3/'02, 8/'03, 10/'04; 1/15   PCI x 5 to SVG-D1    CAD in native artery 07/1993   3 Vessel Disease (LAD-D1 & RCA) -- CABG (Dx in setting of inferior STEMI-PTCA of RCA)   CAD S/P percutaneous coronary angioplasty    PCI to SVG-D1 insertion/native D1 x 4 = '01 -(S660 BMS 2.5 x 9 anastomosis- D1); '02 - distal overlap ACS Pixel 2.5  x 8  BMS; '03 distal/native ISR/Thrombosis - Pixel 2.5 x 13; '04 - ISR-  Taxus 2.5 x 20 (covered all);; 1/15 - mid SVG-D1 (50% distal ISR) - Promus P 2.75 x 20 -- 2.8 mm   COPD mixed type (HCC)    Followed by Dr. Lamonte Sakai "pulmonologist said no COPD"   Depression    Depression with anxiety    Diabetes mellitus type 2 in obese ([place])    Diarrhea    started after cholecystectomy and mass removed from intestine   Dyslipidemia, goal LDL below 70    08/2012: TC 137, TG 200, HDL 32!, LDL 45; on statin (followed by Dr.Deterding)   ESRD (end stage renal disease) ([place]) 1991   s/p Cadaveric Renal Transplant [facility] - Dr. Jimmy Footman)    Family history of adverse reaction to anesthesia    mom's bp dropped during/after anesthesia   Fibromyalgia    GERD (gastroesophageal reflux disease)    Glomerulonephritis, chronic, rapidly progressive 1989   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 07/1993   Rescue PTCA of RCA -- referred for CABG.   H/O: GI bleed    Headache    migraines in the past   History of CABG  x 3 08/1993   Dr. Servando Snare: LIMA-LAD, SVG-bifurcatingD1, SVG-rPDA   History of kidney stones    History of stroke 2012   "right eye stroke- half blind now"   Hypertension associated with diabetes ([place])    Mild aortic stenosis by prior echocardiogram 07/2019   Echo:  Mild aortic stenosis (gradients: Mean 14.3 mmHg -peak 24.9 mmHg).   Morbid obesity (HCC)    MRSA (methicillin resistant staph aureus) culture positive    OSA (obstructive sleep apnea)    no longer on CPAP or home O2, states she doesn't need now after lap band   PAD (peripheral artery disease) ([place]) 08/2013   LEA Dopplers to be read by Dr. Fletcher Anon   PAF (paroxysmal atrial fibrillation) ([place]) 06/2014   Noted on CardioNet Monitor  - --> rhythm control with Tikosyn (Dr. Rayann Heman); converted from warfarin to apixaban for anticoagulation.   Pneumonia    Recurrent boils    Bilateral Groin   Rheumatoid arthritis ([place])     Per Patient Report; associated with OA   S/p cadaver renal transplant 1991   [facility]   Unstable angina ([place]) 5/01; 3/'02, 8/'03, 10/'04; 1/15   x 5 occurences since Inf-Post STEMI in 1994   Assessment: 46 YOF presenting with CP, hx CAD s/p CABG, hx afib on eliquis last dose 0900 today 7/6, plan for cath per cards.    7/7 AM update:  aPTT low Hgb stable  Goal of Therapy:  aPTT 66-102 secs Heparin level 0.3-0.7 units/ml Monitor platelets by anticoagulation protocol: Yes   Plan:  Inc heparin to 1200 units/hr 1200 aPTT/heparin level F/u cath plan and ability to transition back to [place], PharmD, [place] Pharmacist Phone: [phone]

## 2021-02-21 ENCOUNTER — Other Ambulatory Visit (HOSPITAL_COMMUNITY): Payer: Self-pay

## 2021-02-21 ENCOUNTER — Encounter (HOSPITAL_COMMUNITY): Payer: Self-pay | Admitting: Cardiology

## 2021-02-21 DIAGNOSIS — I25110 Atherosclerotic heart disease of native coronary artery with unstable angina pectoris: Principal | ICD-10-CM

## 2021-02-21 LAB — CBC
HCT: 32.1 % — ABNORMAL LOW (ref 36.0–46.0)
Hemoglobin: 10.3 g/dL — ABNORMAL LOW (ref 12.0–15.0)
MCH: 30.9 pg (ref 26.0–34.0)
MCHC: 32.1 g/dL (ref 30.0–36.0)
MCV: 96.4 fL (ref 80.0–100.0)
Platelets: 211 10*3/uL (ref 150–400)
RBC: 3.33 MIL/uL — ABNORMAL LOW (ref 3.87–5.11)
RDW: 16.2 % — ABNORMAL HIGH (ref 11.5–15.5)
WBC: 5.3 10*3/uL (ref 4.0–10.5)
nRBC: 0 % (ref 0.0–0.2)

## 2021-02-21 LAB — BASIC METABOLIC PANEL
Anion gap: 7 (ref 5–15)
BUN: 7 mg/dL — ABNORMAL LOW (ref 8–23)
CO2: 28 mmol/L (ref 22–32)
Calcium: 8.7 mg/dL — ABNORMAL LOW (ref 8.9–10.3)
Chloride: 104 mmol/L (ref 98–111)
Creatinine, Ser: 0.81 mg/dL (ref 0.44–1.00)
GFR, Estimated: >60 mL/min (ref >60)
Glucose, Bld: 135 mg/dL — ABNORMAL HIGH (ref 70–99)
Potassium: 3.6 mmol/L (ref 3.5–5.1)
Sodium: 139 mmol/L (ref 135–145)

## 2021-02-21 LAB — GLUCOSE, CAPILLARY
Glucose-Capillary: 179 mg/dL — ABNORMAL HIGH (ref 70–99)
Glucose-Capillary: 303 mg/dL — ABNORMAL HIGH (ref 70–99)
Glucose-Capillary: 230 mg/dL — ABNORMAL HIGH (ref 70–99)

## 2021-02-21 LAB — MAGNESIUM: Magnesium: 1.8 mg/dL (ref 1.7–2.4)

## 2021-02-21 LAB — APTT: aPTT: 28 seconds (ref 24–36)

## 2021-02-21 MED ORDER — MAGNESIUM SULFATE 2 GM/50ML IV SOLN
2.0000 g | Freq: Once | INTRAVENOUS | Status: AC
  Administered 2021-02-21: 2 g via INTRAVENOUS
  Filled 2021-02-21: qty 50

## 2021-02-21 MED ORDER — PANTOPRAZOLE SODIUM 40 MG PO TBEC
40.0000 mg | DELAYED_RELEASE_TABLET | Freq: Every day | ORAL | 2 refills | Status: DC
  Filled 2021-02-21: qty 30, 30d supply, fill #0

## 2021-02-21 MED ORDER — CLOPIDOGREL BISULFATE 75 MG PO TABS
75.0000 mg | ORAL_TABLET | Freq: Every day | ORAL | 2 refills | Status: DC
  Filled 2021-02-21: qty 90, 90d supply, fill #0

## 2021-02-21 MED ORDER — POTASSIUM CHLORIDE CRYS ER 20 MEQ PO TBCR
40.0000 meq | EXTENDED_RELEASE_TABLET | Freq: Once | ORAL | Status: AC
  Administered 2021-02-21: 40 meq via ORAL
  Filled 2021-02-21: qty 2

## 2021-02-21 MED ORDER — NITROGLYCERIN 0.4 MG SL SUBL
0.4000 mg | SUBLINGUAL_TABLET | SUBLINGUAL | 6 refills | Status: DC | PRN
  Filled 2021-02-21: qty 25, 7d supply, fill #0

## 2021-02-21 MED ORDER — METFORMIN HCL 500 MG PO TABS: 500.0000 mg | ORAL_TABLET | Freq: Two times a day (BID) | ORAL | 6 refills | Status: DC

## 2021-02-21 NOTE — TOC Initial Note (Signed)
Transition of Care [facility]) - Initial/Assessment Note    Patient Details  Name: Katherine [name] MRN: [id number] Date of Birth: Dec 21, 1955  Transition of Care [facility]) CM/SW Contact:    Bethena Roys, RN Phone Number: 02/21/2021, 2:57 PM  Clinical Narrative: Risk for readmission assessment completed. Patient presented from home and the plan will be to return back home. Patient is declining home health physical therapy at this time. Patient states she will be fine once she gets home. Patient states she has transportation home and that the person picking her up will not have oxygen. Patient states she will be fine  to transport without 02- Case Manager called [employer] to deliver a portable tank for home.               Expected Discharge Plan: Home/Self Care Barriers to Discharge: No Barriers Identified   Patient Goals and CMS Choice Patient states their goals for this hospitalization and ongoing recovery are:: to return home   Choice offered to / list presented to : NA  Expected Discharge Plan and Services Expected Discharge Plan: Home/Self Care In-house Referral: NA Discharge Planning Services: CM Consult Post Acute Care Choice: Durable Medical Equipment (Patient has 02 at home via [employer].) Living arrangements for the past 2 months: Single Family Home Expected Discharge Date: 02/21/21               DME Arranged: Oxygen DME Agency: [employer] Date DME Agency Contacted: 02/21/21 Time DME Agency Contacted: [phone] Representative spoke with at DME Agency: Freda Munro (portable tank for travel home) HH Arranged: Patient Refused HH          Prior Living Arrangements/Services Living arrangements for the past 2 months: Single Family Home Lives with:: Self Patient language and need for interpreter reviewed:: Yes Do you feel safe going back to the place where you live?: Yes      Need for Family Participation in Patient Care: Yes (Comment) Care giver support system in place?: Yes  (comment) Current home services: DME (DME 02 via [employer]) Criminal Activity/Legal Involvement Pertinent to Current Situation/Hospitalization: No - Comment as needed  Permission Sought/Granted Permission sought to share information with : Case Manager, Customer service manager Permission granted to share information with : Yes, Verbal Permission Granted     Permission granted to share info w AGENCY: [employer]        Emotional Assessment Appearance:: Appears stated age Attitude/Demeanor/Rapport: Engaged Affect (typically observed): Appropriate Orientation: : Oriented to  Time, Oriented to Place, Oriented to Self, Oriented to Situation Alcohol / Substance Use: Not Applicable Psych Involvement: No (comment)  Admission diagnosis:  Unstable angina (HCC) [I20.0] Patient Active Problem List   Diagnosis Date Noted   Coronary stent restenosis due to scar tissue    Unstable angina ([place]) 02/19/2021   Acute on chronic diastolic CHF (congestive heart failure) ([place]) 01/29/2021   Acute CHF (congestive heart failure) ([place]) 01/29/2021   Acute on chronic systolic CHF (congestive heart failure) ([place]) 01/29/2021   Non-ST elevation (NSTEMI) myocardial infarction ([place])    Chronic respiratory failure with hypoxia ([place]) 01/01/2021   Flu vaccine need 08/13/2020   Need for pneumococcal vaccination 08/13/2020   Visit for screening mammogram 08/13/2020   Candidal skin infection 08/13/2020   GAD (generalized anxiety disorder) 08/13/2020   Chronic bronchitis, mucopurulent ([place]) 08/24/2019   Current chronic use of systemic steroids 07/23/2019   OSA (obstructive sleep apnea)    Severe episode of recurrent major depressive disorder, with psychotic features ([place]) 02/16/2019  Obstructive chronic bronchitis without exacerbation ([place]) 11/03/2018   Long term (current) use of anticoagulants 04/28/2018   Renal transplant, status post 01/28/2018   Primary osteoarthritis of right knee 08/18/2017   Mild aortic  stenosis by prior echocardiogram 01/28/2017   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   Deficiency anemia 10/14/2016   Normocytic anemia 10/13/2016   Fibromyalgia 03/30/2016   Other spondylosis with radiculopathy, lumbar region 03/30/2016   Type 2 diabetes mellitus with peripheral neuropathy ([place]) 03/30/2016   Paroxysmal atrial fibrillation ([place]); CHA2DS2VASc score F, HTN, CAD, CVA = 5 06/28/2014   Hypertension associated with diabetes ([place])    PAD (peripheral artery disease) ([place]) 08/17/2013   Stenosis of right carotid artery without infarction 10/08/2012   Dyslipidemia, goal LDL below 70 10/08/2012    Class: Diagnosis of   Mitral annular calcification 10/08/2012   Hypomagnesemia 06/13/2012   Renal transplant disorder 06/12/2012   Chronic allergic rhinitis 04/29/2011   Extrinsic asthma 09/09/2007   GERD 09/09/2007   COUGH, CHRONIC 09/09/2007   Morbid obesity - s/p Lap Band 9/'05 05/07/2004    Class: Diagnosis of   Hx of CABG 09/07/1993    Class: History of   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 07/1993   Coronary artery disease involving native coronary artery of native heart with unstable angina pectoris ([place]) 07/1993   ESRD (end stage renal disease) ([place]) 1991   PCP:  [name], MD Pharmacy:   [id number] - [place], [address] [place] [address] Phone: [phone] Fax: [phone]  [place], [address] [place] [address] Phone: [phone] Fax: [place] [address] Phone: [phone] Fax: [phone]  [place] [address] [place] [address] Phone: [phone] Fax: [phone]   Readmission Risk Interventions Readmission Risk Prevention Plan 02/21/2021 01/31/2021 12/28/2019  Transportation  Screening Complete Complete Complete  HRI or Home Care Consult - Complete Complete  Social Work Consult for [place] Planning/Counseling - Complete Complete  Palliative Care Screening - Not Applicable Not Applicable  Medication Review Press photographer) Complete Complete Complete  PCP or Specialist appointment within 3-5 days of discharge Complete - -  [place] or Home Care Consult Complete - -  SW Recovery Care/Counseling Consult Complete - -  Palliative Care Screening Not Applicable - -  [place] Not Applicable - -  Some recent data might be hidden

## 2021-02-21 NOTE — Discharge Instructions (Signed)
Information on my medicine - ELIQUIS (apixaban)  Why was Eliquis prescribed for you? Eliquis was prescribed for you to reduce the risk of a blood clot forming that can cause a stroke if you have a medical condition called atrial fibrillation (a type of irregular heartbeat).  What do You need to know about Eliquis ? Take your Eliquis TWICE DAILY - one tablet in the morning and one tablet in the evening with or without food. If you have difficulty swallowing the tablet whole please discuss with your pharmacist how to take the medication safely.  Take Eliquis exactly as prescribed by your doctor and DO NOT stop taking Eliquis without talking to the doctor who prescribed the medication.  Stopping may increase your risk of developing a stroke.  Refill your prescription before you run out.  After discharge, you should have regular check-up appointments with your healthcare provider that is prescribing your Eliquis.  In the future your dose may need to be changed if your kidney function or weight changes by a significant amount or as you get older.  What do you do if you miss a dose? If you miss a dose, take it as soon as you remember on the same day and resume taking twice daily.  Do not take more than one dose of ELIQUIS at the same time to make up a missed dose.  Important Safety Information A possible side effect of Eliquis is bleeding. You should call your healthcare provider right away if you experience any of the following: Bleeding from an injury or your nose that does not stop. Unusual colored urine (red or dark brown) or unusual colored stools (red or black). Unusual bruising for unknown reasons. A serious fall or if you hit your head (even if there is no bleeding).  Some medicines may interact with Eliquis and might increase your risk of bleeding or clotting while on Eliquis. To help avoid this, consult your healthcare provider or pharmacist prior to using any new prescription or  non-prescription medications, including herbals, vitamins, non-steroidal anti-inflammatory drugs (NSAIDs) and supplements.  This website has more information on Eliquis (apixaban): http://www.eliquis.com/eliquis/home     Information about your medication: Plavix (anti-platelet agent)  Generic Name (Brand): clopidogrel (Plavix), once daily medication  PURPOSE: You are taking this medication along with aspirin to lower your chance of having a heart attack, stroke, or blood clots in your heart stent. These can be fatal. Plavix and aspirin help prevent platelets from sticking together and forming a clot that can block an artery or your stent.   Common SIDE EFFECTS you may experience include: bruising or bleeding more easily, shortness of breath  Do not stop taking PLAVIX without talking to the doctor who prescribes it for you. People who are treated with a stent and stop taking Plavix too soon, have a higher risk of getting a blood clot in the stent, having a heart attack, or dying. If you stop Plavix because of bleeding, or for other reasons, your risk of a heart attack or stroke may increase.   Avoid taking NSAID agents or anti-inflammatory medications such as ibuprofen, naproxen given increased bleed risk with plavix - can use acetaminophen (Tylenol) if needed for pain.  Avoid taking over the counter stomach medications omeprazole (Prilosec) or esomeprazole (Nexium) since these do interact and make plavix less effective - ask your pharmacist or doctor for alterative agents if needed for heartburn or GERD.   Tell all of your doctors and dentists that you are taking  Plavix. They should talk to the doctor who prescribed Plavix for you before you have any surgery or invasive procedure.   Contact your health care provider if you experience: severe or uncontrollable bleeding, pink/red/brown urine, vomiting blood or vomit that looks like "coffee grounds", red or black stools (looks like tar), coughing  up blood or blood clots ----------------------------------------------------------------------------------------------------------------------

## 2021-02-21 NOTE — Discharge Summary (Addendum)
Discharge Summary    Patient ID: Katherine [name] MRN: [id number]; DOB: 01-27-1956  Admit date: 02/19/2021 Discharge date: 02/21/2021  PCP:  [name], MD   [facility] Providers Cardiologist:  [name], MD     Discharge Diagnoses    Principal Problem:   Unstable angina [facility]) Active Problems:   Dyslipidemia, goal LDL below 70   PAD (peripheral artery disease) ([place])   Paroxysmal atrial fibrillation ([place]); CHA2DS2VASc score F, HTN, CAD, CVA = 5   Type 2 diabetes mellitus with peripheral neuropathy ([place])   Coronary artery disease involving native coronary artery of native heart with unstable angina pectoris ([place])   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 Ost LAD to Prox LAD lesion is 50% stenosed. Prox LAD-1 lesion is 100% stenosed. Prox LAD-2 lesion is 100% stenosed. Ost Cx to Prox Cx lesion is 60% stenosed -focal eccentric heavily calcified lesion -> negative RFR (0.96) 1st Mrg lesion is 40% stenosed. 2nd Mrg lesion is 80% stenosed - focal SVG-RCA graft was visualized by angiography and is large. The graft exhibits minimal luminal irregularities. SCG-RCA: Mid Graft to Dist Graft lesion is 40% stenosed. LIMA graft was not visualized due to inability to cannulate. ------CULPRIT LESIONS - SVG-DIAG ISR & Native Diag 95% ------- SVG-Diag: Prox Graft to Mid Graft Stent is 10% stenosed. SVG-Diag: Dist Graft to Insertion Stent is 90% stenosed. Scoring balloon angioplasty was performed using a BALLOON SCOREFLEX 2.50X10. Followed by 2.75 mm [place] balloon post dilation Post intervention, there is a 25% residual stenosis. Ost 1st Diag Stent (from Graft into native) is 80% re-stenosed. Native 1st Diag (immediately after the sten) is 95% stenosed. A drug-eluting stent was successfully placed overlapping from the distal stent edge crossing the lesion, using a STENT ONYX FRONTIER  2.25X12. -> Postdilated at the overlap segment to 2.6 mm, and distally to 2.4 mm -------------------------------------------- Post intervention, there is a 15% residual stenosis at the 80% site, and 0% residual stenosis at the 95% site.. LV end diastolic pressure is moderate-severely elevated.   SUMMARY: Stable findings Stable Severe native CAD with chronic total occlusion of LAD and RCA LCx calcified 60% ostial lesion - RFR negative (0.96); PCI deferred. LIMA to LAD not visualized as only access was right radial SVG to RCA 40% - confirmed SVG to D2 recurrent high-grade 90% ISR with 95% lesion in D2 after graft insertion Successful DES PCI of 95% lesion in D2 after graft insertion and recent stent with Onyx Frontier 2.25 mm x 12 mm postdilated to 2.5 mm distally and 2.75 mm at the overlap. Borderline successful PTCA of in-stent restenosis in the body of graft. ->  2.5 mm scoring balloon followed by post dilation with a 2.75 mm [place] balloon     RECOMMENDATIONS Overnight observation on 6 E. post procedure unit. ->  Difficult TR band management. She will restart Eliquis tonight, and continue with clopidogrel without aspirin. If stable, anticipate potential discharge tomorrow After discussion with the patient's close family member, would consider discussing possibility of home health nurse/physical therapy to assist during the patient's recovery.  [name], MD  Diagnostic Dominance: Right    Intervention    _____________   History of Present Illness     Katherine [name] is a 65 y.o. female with a hx of CAD s/p CABG multiple PCIs, persistent atrial fib (eliquis and tikosyn), HLD, PAD, renal transplant '91,  COPD and DM who presented with chest pain.   She has a history of anterior wall MI dating back to 1994 with PTCA.  Underwent CABG in 1995 with LIMA to LAD, SVG to diagonal, SVG to distal RCA.  In 2001 she had stent to SVG to diagonal, and in 2002 needed overlapping stenting to  the same area.  In 2003 developed recurrent stenosis causing chest pain and underwent thrombectomy and tandem stenting.  In 2004 presented with unstable angina and needed PCI of SVG to diagonal at the distal anastomotic site with Taxus stent.  In 2015 developed stenosis to SVG to diagonal proximal to prior stenting.  Echo in 2019 EF was noted at 55 to 60% with grade 2 diastolic dysfunction and mild AS.    Underwent cardiac MRI 12/2019 for possible myocarditis which was negative with normal aortic root.  EF was noted at 56% with hypokinesis of the mid/anterior septal, inferior and apical septal walls.    She is also been followed in the A. fib clinic and currently on Tikosyn and Eliquis.  She is status post renal transplant.  On chronic home O2 secondary to COPD.   Recently admitted on 6/15 with chest pain.  Underwent cardiac catheterization on 6/20 with severe native CAD with CTO of the LAD and RCA, left circumflex with calcified 60% ostial lesion which was new.  LIMA to LAD was not visualized via right radial access.  SVG to D2 with recurrent high-grade 90% in-stent restenosis along with 95% lesion in D2 after graft insertion.  Her case was reviewed with interventional colleagues with recommendation to intensify medical therapy and if this fails consider Flow Wire interrogation of the ostial circumflex with repeat PCI of SVG to D2.  She was treated with aspirin, metoprolol 100 mg twice daily, Crestor 40 mg daily and Imdur 30 mg daily added. Echo that admission showed EF of 55 to 60% with G3DD.   Presented to the ED with chest pain 7/6. Reports she had brief episodes of chest pain when she left the hospital but this morning she woke up and walked to the kitchen to get a drink. Developed centralized chest pain with radiation into the right arm. Rated pain 9/10. Called EMS, given nitro with resolution of pain.   In the ED labs showed stable electrolytes, Cr 0.97, hsTn 25, WBC 10.7, Hgb 10.6. CXR showed mild  interstitial edema vs CHF. EKG shows SR with TWI inferolateral leads. At the time of interview, chest pain mostly resolved with brief twinges of pain. Remains on [place] _0  which is her baseline.   Hospital Course     Unstable angina: presented with recurrent chest pain. Cardiac cath noted above with successful DES/PCI of 95% lesion in D2 after graft insertion along with borderline successful PTCA of in-stent restenosis of the body of the graft. 60% calcified lesion in ostial circumflex, RFR negative.  Recommendations to continue on Plavix as monotherapy given the need for Eliquis and her increased bleeding risk.  No recurrent chest pain post cath. -- continue plavix, BB, Imdur, metoprolol   Chronic combined HF: Echo last admission EF 55-60% with G3DD. Does not appear significantly volume overloaded on exam -- on BB -- continued on lasix 34m daily   Paroxysmal Afib: in SR on admission -- resumed on Eliquis post cath   ESRD s/p renal transplant: Cr stable at 0.97>>0.87   HLD: on crestor 464mdaily   DM: Recent Hgb A1c 9.1 -- metformin resumed post cath   COPD:  3L [place] chronically   Chronic pain: continue home pain medication regimen   PVD: has known occlusion of right common femoral artery, left CFA occluded on u/s last admission. Has [name] AVF as well.  -- outpatient follow up with [facility]  Deconditioned: seen by PT with recommendation for HHPT. Orders placed at discharge.   General: Well developed, well nourished, female appearing in no acute distress. Head: Normocephalic, atraumatic.  Neck: Supple without bruits, JVD. Lungs:  Resp regular and unlabored, CTA. Heart: RRR, S1, S2, no S3, S4, or murmur; no rub. Abdomen: Soft, non-tender, non-distended with normoactive bowel sounds. No hepatomegaly. No rebound/guarding. No obvious abdominal masses. Extremities: No clubbing, cyanosis, edema. Distal pedal pulses are 2+ bilaterally. Right radial cath site stable with mild bruising but no  hematoma Neuro: Alert and oriented X 3. Moves all extremities spontaneously. Psych: Normal affect.   Did the patient have an acute coronary syndrome (MI, NSTEMI, STEMI, etc) this admission?:  No                               Did the patient have a percutaneous coronary intervention (stent / angioplasty)?:  Yes.     Cath/PCI Registry Performance & Quality Measures: Aspirin prescribed? - No - plavix as monotherapy with need for Eliquis and increased bleeding risk ADP Receptor Inhibitor (Plavix/Clopidogrel, Brilinta/Ticagrelor or Effient/Prasugrel) prescribed (includes medically managed patients)? - Yes High Intensity Statin (Lipitor 40-27m or Crestor 20-431m prescribed? - Yes For EF <40%, was ACEI/ARB prescribed? - Not Applicable (EF >/= 4083%For EF <40%, Aldosterone Antagonist (Spironolactone or Eplerenone) prescribed? - Not Applicable (EF >/= 4015%Cardiac Rehab Phase II ordered? - Yes      _____________  Discharge Vitals Blood pressure (!) 166/82, pulse 91, temperature 98.8 F (37.1 C), temperature source Oral, resp. rate 17, height _0  (1.549 m), weight 105.8 kg, SpO2 97 %.  Filed Weights   02/20/21 0400 02/20/21 0458  Weight: 103 kg 105.8 kg    Labs & Radiologic Studies    CBC Recent Labs    02/19/21 1307 02/20/21 0125 02/21/21 0318  WBC 10.7* 6.6 5.3  NEUTROABS 8.8*  --   --   HGB 10.6* 10.3* 10.3*  HCT 33.1* 32.3* 32.1*  MCV 96.5 97.0 96.4  PLT 230 194 [id number] Basic Metabolic Panel Recent Labs    02/20/21 0125 02/21/21 0318  NA 137 139  K 3.8 3.6  CL 103 104  CO2 26 28  GLUCOSE 199* 135*  BUN 13 7*  CREATININE 0.81 0.81  CALCIUM 9.0 8.7*  MG  --  1.8   Liver Function Tests Recent Labs    02/19/21 1307  AST 26  ALT 12  ALKPHOS 106  BILITOT 0.6  PROT 6.5  ALBUMIN 3.0*   No results for input(s): LIPASE, AMYLASE in the last 72 hours. High Sensitivity Troponin:   Recent Labs  Lab 01/29/21 0230 01/29/21 0625 01/29/21 0746 02/19/21 1307  02/19/21 1524  TROPONINIHS 241* 326* 305* 25* 42*    BNP Invalid input(s): POCBNP D-Dimer No results for input(s): DDIMER in the last 72 hours. Hemoglobin A1C No results for input(s): HGBA1C in the last 72 hours. Fasting Lipid Panel Recent Labs    02/20/21 0125  CHOL 99  HDL 51  LDLCALC 15  TRIG 165*  CHOLHDL 1.9   Thyroid Function Tests No results for input(s): TSH, T4TOTAL, T3FREE, THYROIDAB in the last 72 hours.  Invalid input(s): FREET3  _____________  CT Head Wo Contrast  Result Date: 01/29/2021 CLINICAL DATA:  Delirium and head trauma. EXAM: CT HEAD WITHOUT CONTRAST TECHNIQUE: Contiguous axial images were obtained from the base of the skull through the vertex without intravenous contrast. COMPARISON:  04/06/2017 FINDINGS: Brain: There is no mass, hemorrhage or extra-axial collection. The size and configuration of the ventricles and extra-axial CSF spaces are normal. There is hypoattenuation of the white matter, most commonly indicating chronic small vessel disease. Vascular: No abnormal hyperdensity of the major intracranial arteries or dural venous sinuses. No intracranial atherosclerosis. Skull: The visualized skull base, calvarium and extracranial soft tissues are normal. Sinuses/Orbits: No fluid levels or advanced mucosal thickening of the visualized paranasal sinuses. No mastoid or middle ear effusion. The orbits are normal. IMPRESSION: Chronic small vessel disease without acute intracranial abnormality. Electronically Signed   By: Ulyses Jarred M.D.   On: 01/29/2021 02:03   CARDIAC CATHETERIZATION  Result Date: 02/20/2021  Mid RCA to Dist RCA lesion is 100% stenosed.  Ost LAD to Prox LAD lesion is 50% stenosed. Prox LAD-1 lesion is 100% stenosed. Prox LAD-2 lesion is 100% stenosed.  Ost Cx to Prox Cx lesion is 60% stenosed -focal eccentric heavily calcified lesion -> negative RFR (0.96)  1st Mrg lesion is 40% stenosed.  2nd Mrg lesion is 80% stenosed - focal  SVG-RCA  graft was visualized by angiography and is large. The graft exhibits minimal luminal irregularities.  SCG-RCA: Mid Graft to Dist Graft lesion is 40% stenosed.  LIMA graft was not visualized due to inability to cannulate.  ------CULPRIT LESIONS - SVG-DIAG ISR & Native Diag 95% -------  SVG-Diag: Prox Graft to Mid Graft Stent is 10% stenosed.  SVG-Diag: Dist Graft to Insertion Stent is 90% stenosed.  Scoring balloon angioplasty was performed using a BALLOON SCOREFLEX 2.50X10. Followed by 2.75 mm [place] balloon post dilation  Post intervention, there is a 25% residual stenosis.  Ost 1st Diag Stent (from Graft into native) is 80% re-stenosed. Native 1st Diag (immediately after the sten) is 95% stenosed.  A drug-eluting stent was successfully placed overlapping from the distal stent edge crossing the lesion, using a STENT ONYX FRONTIER 2.25X12. -> Postdilated at the overlap segment to 2.6 mm, and distally to 2.4 mm  --------------------------------------------  Post intervention, there is a 15% residual stenosis at the 80% site, and 0% residual stenosis at the 95% site..  LV end diastolic pressure is moderate-severely elevated.  SUMMARY: Stable findings  Stable Severe native CAD with chronic total occlusion of LAD and RCA  LCx calcified 60% ostial lesion - RFR negative (0.96); PCI deferred.  LIMA to LAD not visualized as only access was right radial  SVG to RCA 40% - confirmed  SVG to D2 recurrent high-grade 90% ISR with 95% lesion in D2 after graft insertion  Successful DES PCI of 95% lesion in D2 after graft insertion and recent stent with Onyx Frontier 2.25 mm x 12 mm postdilated to 2.5 mm distally and 2.75 mm at the overlap.  Borderline successful PTCA of in-stent restenosis in the body of graft. ->  2.5 mm scoring balloon followed by post dilation with a 2.75 mm [place] balloon RECOMMENDATIONS  Overnight observation on 6 E. post procedure unit. ->  Difficult TR band management.  She will restart Eliquis  tonight, and continue with clopidogrel without aspirin.  If stable, anticipate potential discharge tomorrow  After discussion with the patient's close family member, would consider discussing possibility of home health nurse/physical therapy to assist during  the patient's recovery. [name], MD  CARDIAC CATHETERIZATION  Result Date: 01/31/2021  Ost LAD to Prox LAD lesion is 50% stenosed.  Prox LAD-1 lesion is 100% stenosed.  Prox LAD-2 lesion is 100% stenosed.  Ost RCA to Dist RCA lesion is 100% stenosed.  SVG and is large.  The graft exhibits minimal luminal irregularities.  SVG and is large.  The graft exhibits minimal luminal irregularities.  LIMA.  Mid Graft to Dist Graft lesion is 40% stenosed.  Ost Cx to Prox Cx lesion is 60% stenosed.  Dist Graft to Insertion lesion is 90% stenosed.  Ost 1st Diag to 1st Diag lesion is 95% stenosed.  1st Mrg lesion is 40% stenosed.  3rd Mrg lesion is 80% stenosed.  Assessment: 1. Severe native CAD with chronic total occlusion of LAD and RCA 2. LCx with new calcified 60% ostial lesion 3. LIMA to LAD not visualized as only access was right radial 4. SVG to RCA 40% 5. SVG to D2 with recurrent high-grade 90% ISR with 95% lesion in D2 after graft insertion Case reviewed with Drs. Burt Knack and [place]. Plan will be to intensify medical therapy. If fails medical therapy consider flow wire interrogation of ostial LCx and repeat PCI of SVG-D2. Of note, patient has [name] AVF and known occlusion of R common femoral artery. We initially planned left femoral artery access but on u/s the left CFA was occluded with heavily plaque. We thus chose to go right radial as only access site and try to cross over to inject the LIMA but were unsuccessful. LIMA shown to be widely patent on cath in 2015. [name], MD 12:17 PM   DG Chest Port 1 View  Result Date: 02/19/2021 CLINICAL DATA:  Chest pain. EXAM: PORTABLE CHEST 1 VIEW COMPARISON:  January 29, 2021. FINDINGS:  Similar enlarged cardiac silhouette. Median sternotomy and CABG. Calcific atherosclerosis of the aorta. Similar central pulmonary vascular congestion. Similar mild interstitial prominence. No consolidation. No visible pleural effusions or pneumothorax on these limited AP semi erect radiographs. IMPRESSION: 1. Similar cardiomegaly and central pulmonary vascular congestion. 2. Similar mild interstitial prominence, which could reflect mild interstitial edema versus the sequela of recurrent bouts of congestive heart failure. Electronically Signed   By: Margaretha Sheffield MD   On: 02/19/2021 14:18   DG Chest Portable 1 View  Result Date: 01/29/2021 CLINICAL DATA:  Shortness of breath EXAM: PORTABLE CHEST 1 VIEW COMPARISON:  None. FINDINGS: Mild cardiomegaly. Remote median sternotomy. Unchanged mild interstitial opacity. The visualized skeletal structures are unremarkable. IMPRESSION: Cardiomegaly and mild interstitial pulmonary edema. Electronically Signed   By: Ulyses Jarred M.D.   On: 01/29/2021 00:55   ECHOCARDIOGRAM COMPLETE  Result Date: 02/01/2021    ECHOCARDIOGRAM REPORT   Patient Name:   JANAE BONSER Date of Exam: 01/30/2021 Medical Rec #:  [id number]          Height:       61.0 in Accession #:    [id number]         Weight:       233.2 lb Date of Birth:  12-28-1955          BSA:          2.016 m Patient Age:    39 years           BP:           142/61 mmHg Patient Gender: F  HR:           78 bpm. Exam Location:  Inpatient Procedure: 2D Echo, Cardiac Doppler, Color Doppler and Intracardiac            Opacification Agent Indications:    CHF-Acute Systolic  History:        Patient has prior history of Echocardiogram examinations, most                 recent 07/12/2020. Previous Myocardial Infarction and CAD, Prior                 CABG, COPD; Risk Factors:Hypertension, Diabetes, Dyslipidemia                 and Former Smoker. GERD. Hx stroke.  Sonographer:    Clayton Lefort RDCS (AE) Referring  Phys: [place] Comments: Technically difficult study due to poor echo windows and no subcostal window. IMPRESSIONS  1. Left ventricular ejection fraction, by estimation, is 55 to 60%. The left ventricle has normal function. Left ventricular endocardial border not optimally defined to evaluate regional wall motion. There is mild left ventricular hypertrophy. Left ventricular diastolic parameters are consistent with Grade I diastolic dysfunction (impaired relaxation). Elevated left atrial pressure.  2. Right ventricular systolic function is normal. The right ventricular size is normal.  3. Left atrial size was moderately dilated.  4. The mitral valve is abnormal. Mild mitral valve regurgitation. Mild mitral stenosis.  5. The aortic valve has an indeterminant number of cusps. There is mild calcification of the aortic valve. There is mild thickening of the aortic valve. Aortic valve regurgitation is not visualized. Mild aortic valve stenosis. FINDINGS  Left Ventricle: Left ventricular ejection fraction, by estimation, is 55 to 60%. The left ventricle has normal function. Left ventricular endocardial border not optimally defined to evaluate regional wall motion. Definity contrast agent was given IV to delineate the left ventricular endocardial borders. The left ventricular internal cavity size was normal in size. There is mild left ventricular hypertrophy. Left ventricular diastolic parameters are consistent with Grade I diastolic dysfunction (impaired relaxation). Elevated left atrial pressure. Right Ventricle: The right ventricular size is normal. No increase in right ventricular wall thickness. Right ventricular systolic function is normal. Left Atrium: Left atrial size was moderately dilated. Right Atrium: Right atrial size was not well visualized. Pericardium: There is no evidence of pericardial effusion. Mitral Valve: The mitral valve is abnormal. There is mild thickening of the mitral valve  leaflet(s). There is mild calcification of the mitral valve leaflet(s). Mild mitral annular calcification. Mild mitral valve regurgitation. Mild mitral valve stenosis. MV peak gradient, 10.8 mmHg. The mean mitral valve gradient is 4.0 mmHg. Tricuspid Valve: The tricuspid valve is normal in structure. Tricuspid valve regurgitation is not demonstrated. No evidence of tricuspid stenosis. Aortic Valve: The aortic valve has an indeterminant number of cusps. There is mild calcification of the aortic valve. There is mild thickening of the aortic valve. There is mild aortic valve annular calcification. Aortic valve regurgitation is not visualized. Mild aortic stenosis is present. Aortic valve mean gradient measures 9.5 mmHg. Aortic valve peak gradient measures 20.5 mmHg. Aortic valve area, by VTI measures 1.61 cm. Pulmonic Valve: The pulmonic valve was not well visualized. Pulmonic valve regurgitation is not visualized. No evidence of pulmonic stenosis. Aorta: The aortic root is normal in size and structure. Pulmonary Artery: Indeterminant PASP, inadequate TR jet. Venous: The inferior vena cava was not well visualized. IAS/Shunts: The interatrial septum  was not well visualized.  LEFT VENTRICLE PLAX 2D LVOT diam:     1.90 cm  Diastology LV SV:         67       LV e' medial:    4.12 cm/s LV SV Index:   33       LV E/e' medial:  38.6 LVOT Area:     2.84 cm LV e' lateral:   8.56 cm/s                         LV E/e' lateral: 18.6  RIGHT VENTRICLE RV Basal diam:  2.90 cm RV S prime:     9.56 cm/s TAPSE (M-mode): 1.6 cm LEFT ATRIUM             Index       RIGHT ATRIUM           Index LA Vol (A2C):   74.5 ml 36.95 ml/m RA Area:     19.00 cm LA Vol (A4C):   76.3 ml 37.84 ml/m RA Volume:   47.90 ml  23.76 ml/m LA Biplane Vol: 75.2 ml 37.30 ml/m  AORTIC VALVE AV Area (Vmax):    1.49 cm AV Area (Vmean):   1.65 cm AV Area (VTI):     1.61 cm AV Vmax:           226.50 cm/s AV Vmean:          142.000 cm/s AV VTI:            0.416  m AV Peak Grad:      20.5 mmHg AV Mean Grad:      9.5 mmHg LVOT Vmax:         119.00 cm/s LVOT Vmean:        82.500 cm/s LVOT VTI:          0.237 m LVOT/AV VTI ratio: 0.57  AORTA Ao Root diam: 2.80 cm MITRAL VALVE MV Area (PHT): 4.05 cm     SHUNTS MV Peak grad:  10.8 mmHg    Systemic VTI:  0.24 m MV Mean grad:  4.0 mmHg     Systemic Diam: 1.90 cm MV Vmax:       1.64 m/s MV Vmean:      87.1 cm/s MV Decel Time: 188 msec MV E velocity: 159.00 cm/s MV A velocity: 73.75 cm/s MV E/A ratio:  2.16 Carlyle Dolly MD Electronically signed by Carlyle Dolly MD Signature Date/Time: 02/01/2021/2:15:39 PM    Final    Disposition   Pt is being discharged home today in good condition.  Follow-up Plans & Appointments     Follow-up [facility] Follow up on 03/03/2021.   Specialty: Cardiology Why: at 8:30am for your follow up appt with Dr. Allison Quarry NP Emelia Loron. This will be at the [place] location. Contact information: [place] [address] [phone]               Discharge Instructions     Amb Referral to Cardiac Rehabilitation   Complete by: As directed    Diagnosis: Coronary Stents   After initial evaluation and assessments completed: Virtual Based Care may be provided alone or in conjunction with Phase 2 Cardiac Rehab based on patient barriers.: Yes   Diet - low sodium heart healthy   Complete by: As directed    Discharge instructions   Complete by: As directed  Radial Site Care Refer to this sheet in the next few weeks. These instructions provide you with information on caring for yourself after your procedure. Your caregiver may also give you more specific instructions. Your treatment has been planned according to current medical practices, but problems sometimes occur. Call your caregiver if you have any problems or questions after your procedure. HOME CARE INSTRUCTIONS You may shower  the day after the procedure. Remove the bandage (dressing) and gently wash the site with plain soap and water. Gently pat the site dry.  Do not apply powder or lotion to the site.  Do not submerge the affected site in water for 3 to 5 days.  Inspect the site at least twice daily.  Do not flex or bend the affected arm for 24 hours.  No lifting over 5 pounds (2.3 kg) for 5 days after your procedure.  Do not drive home if you are discharged the same day of the procedure. Have someone else drive you.  You may drive 24 hours after the procedure unless otherwise instructed by your caregiver.  What to expect: Any bruising will usually fade within 1 to 2 weeks.  Blood that collects in the tissue (hematoma) may be painful to the touch. It should usually decrease in size and tenderness within 1 to 2 weeks.  SEEK IMMEDIATE MEDICAL CARE IF: You have unusual pain at the radial site.  You have redness, warmth, swelling, or pain at the radial site.  You have drainage (other than a small amount of blood on the dressing).  You have chills.  You have a fever or persistent symptoms for more than 72 hours.  You have a fever and your symptoms suddenly get worse.  Your arm becomes pale, cool, tingly, or numb.  You have heavy bleeding from the site. Hold pressure on the site.   PLEASE DO NOT MISS ANY DOSES OF YOUR PLAVIX!!!!! Also keep a log of you blood pressures and bring back to your follow up appt. Please call the office with any questions.   Patients taking blood thinners should generally stay away from medicines like ibuprofen, Advil, Motrin, naproxen, and Aleve due to risk of stomach bleeding. You may take Tylenol as directed or talk to your primary doctor about alternatives.  Some studies suggest Prilosec/Omeprazole interacts with Plavix. We changed your Prilosec/Omeprazole to the equivalent dose of Protonix for less chance of interaction.  PLEASE ENSURE THAT YOU DO NOT RUN OUT OF YOUR PLAVIX. This  medication is very important to remain on for at least one year. IF you have issues obtaining this medication due to cost please CALL the office 3-5 business days prior to running out in order to prevent missing doses of this medication.   Face-to-face encounter (required for Medicare/Medicaid patients)   Complete by: As directed    I Reino Bellis certify that this patient is under my care and that I, or a nurse practitioner or physician's assistant working with me, had a face-to-face encounter that meets the physician face-to-face encounter requirements with this patient on 02/21/2021. The encounter with the patient was in whole, or in part for the following medical condition(s) which is the primary reason for home health care (List medical condition): Deconditioned, CAD, CHF   The encounter with the patient was in whole, or in part, for the following medical condition, which is the primary reason for home health care: deconditioned, CAD, CHF   I certify that, based on my findings, the following services are medically  necessary home health services: Physical therapy   Reason for Medically Necessary Home Health Services: Other See Comments   My clinical findings support the need for the above services: OTHER SEE COMMENTS   Further, I certify that my clinical findings support that this patient is homebound due to: Ambulates short distances less than 300 feet   [place]   Complete by: As directed    To provide the following care/treatments: PT   Increase activity slowly   Complete by: As directed        Discharge Medications   Allergies as of 02/21/2021       Reactions   Tetracycline Hives   Patient tolerated Doxycycline Dec 2020   Niacin Other (See Comments)   Mouth blisters   Niaspan [niacin Er] Other (See Comments)   Mouth blisters   Sulfa Antibiotics Nausea Only, Other (See Comments)   "Tears up stomach"   Sulfonamide Derivatives Other (See Comments)   Reaction: per patient "tears  her stomach up"   Codeine Nausea And Vomiting   Erythromycin Nausea And Vomiting   Hydromorphone Hcl Nausea And Vomiting   Morphine And Related Nausea And Vomiting   Nalbuphine Nausea And Vomiting   Nubain   Sulfasalazine Nausea Only, Other (See Comments)   per patient "tears her stomach up", "Tears up stomach"   Tape Rash, Other (See Comments)   No "plastic" tape," please----cloth tape only        Medication List     STOP taking these medications    Aspirin Low Dose 81 MG EC tablet Generic drug: aspirin   omeprazole 20 MG capsule Commonly known as: PRILOSEC       TAKE these medications    acetaminophen 500 MG tablet Commonly known as: TYLENOL Take 1,500 mg by mouth daily.   albuterol 108 (90 Base) MCG/ACT inhaler Commonly known as: VENTOLIN HFA TAKE 2 PUFFS BY MOUTH EVERY 6 HOURS AS NEEDED FOR WHEEZE OR SHORTNESS OF BREATH What changed: See the new instructions.   arformoterol 15 MCG/2ML Nebu Commonly known as: BROVANA Take 2 mLs (15 mcg total) by nebulization 2 (two) times daily.   azaTHIOprine 50 MG tablet Commonly known as: IMURAN Take 125 mg by mouth daily.   B-D ULTRAFINE III SHORT PEN 31G X 8 MM Misc Generic drug: Insulin Pen Needle See admin instructions.   Basaglar KwikPen 100 UNIT/ML Inject 12 Units into the skin daily before breakfast.   budesonide 0.5 MG/2ML nebulizer solution Commonly known as: PULMICORT Take 2 mLs (0.5 mg total) by nebulization 2 (two) times daily.   calcitRIOL 0.25 MCG capsule Commonly known as: ROCALTROL Take 0.25 mcg by mouth every 3 (three) days.   cetirizine 10 MG tablet Commonly known as: ZYRTEC Take 10 mg by mouth daily.   clopidogrel 75 MG tablet Commonly known as: PLAVIX Take 1 tablet (75 mg total) by mouth daily. Start taking on: February 22, 2021   clotrimazole 10 MG troche Commonly known as: MYCELEX Take 1 tablet (10 mg total) by mouth 5 (five) times daily as needed (thrush).   cyclobenzaprine 10 MG  tablet Commonly known as: FLEXERIL TAKE 1 TO 2 TABS BY MOUTH DAILY. TAKE 2 TAB DAILY AT BEDTIME, MAY ALSO TAKE 1 TABLET BY MOUTH AT NOON AS NEEDED FOR MUSCLE SPASMS What changed: See the new instructions.   dofetilide 250 MCG capsule Commonly known as: TIKOSYN Take 1 capsule (250 mcg total) by mouth 2 (two) times daily. Keep June appointment   DULoxetine 30 MG  capsule Commonly known as: CYMBALTA TAKE 2 CAPSULES (60 MG TOTAL) BY MOUTH EVERY MORNING. What changed: See the new instructions.   Eliquis 5 MG Tabs tablet Generic drug: apixaban TAKE 1 TABLET BY MOUTH TWICE A DAY (TO REPLACE WARFARIN) What changed: See the new instructions.   fluticasone 50 MCG/ACT nasal spray Commonly known as: FLONASE PLACE 2 SPRAYS INTO BOTH NOSTRILS 2 TIMES DAILY What changed:  how much to take how to take this when to take this additional instructions   furosemide 40 MG tablet Commonly known as: LASIX Take 1 tablet (40 mg total) by mouth daily.   glimepiride 4 MG tablet Commonly known as: AMARYL Take 4 mg by mouth daily.   isosorbide mononitrate 60 MG 24 hr tablet Commonly known as: IMDUR Take 1 tablet (60 mg total) by mouth daily.   lamoTRIgine 25 MG tablet Commonly known as: LAMICTAL TAKE 1 TABLET BY MOUTH EVERYDAY AT BEDTIME What changed: See the new instructions.   LORazepam 1 MG tablet Commonly known as: ATIVAN Take 1 tablet (1 mg total) by mouth 2 (two) times daily.   metFORMIN 500 MG tablet Commonly known as: GLUCOPHAGE Take 1 tablet (500 mg total) by mouth 2 (two) times daily. Start taking on: February 22, 2021   metoprolol tartrate 100 MG tablet Commonly known as: LOPRESSOR Take 1 tablet (100 mg total) by mouth 2 (two) times daily. Appointment Required For Further Refills [phone] What changed: Another medication with the same name was removed. Continue taking this medication, and follow the directions you see here.   montelukast 10 MG tablet Commonly known as:  SINGULAIR TAKE 1 TABLET BY MOUTH EVERYDAY AT BEDTIME What changed: See the new instructions.   nitroGLYCERIN 0.4 MG SL tablet Commonly known as: NITROSTAT Place 1 tablet (0.4 mg total) under the tongue every 5 (five) minutes as needed for chest pain.   NovoLOG FlexPen 100 UNIT/ML FlexPen Generic drug: insulin aspart Inject 14 Units into the skin 3 (three) times daily with meals.   ONE TOUCH ULTRA 2 w/Device Kit 2 (two) times daily.   OneTouch Delica Plus [id number] Misc daily. as directed   OneTouch Ultra test strip Generic drug: glucose blood USE AS DIRECTED 3 TIMES DAILY TO CHECK BLOOD SUGAR   Oxycodone HCl 10 MG Tabs Take 1 tablet (10 mg total) by mouth 3 (three) times daily as needed. What changed: reasons to take this   OXYGEN Inhale 3 L into the lungs continuous.   pantoprazole 40 MG tablet Commonly known as: Protonix Take 1 tablet (40 mg total) by mouth daily.   predniSONE 5 MG tablet Commonly known as: DELTASONE Take 5 mg by mouth daily.   promethazine 25 MG tablet Commonly known as: PHENERGAN Take 1 tablet (25 mg total) by mouth at bedtime as needed for nausea. For nausea and sleep What changed:  reasons to take this additional instructions   rosuvastatin 40 MG tablet Commonly known as: CRESTOR Take 1 tablet (40 mg total) by mouth daily.   sertraline 100 MG tablet Commonly known as: ZOLOFT Take 100 mg by mouth daily.           Outstanding Labs/Studies   N/a  Duration of Discharge Encounter   Greater than 30 minutes including physician time.  Signed, Reino Bellis, NP 02/21/2021, 1:35 PM  I have personally seen and examined this patient. I agree with the assessment and plan as outlined above.  She is doing well post PCI/stenting of the Diagonal through the graft and balloon  angioplasty of in stent restenosis body of graft to Diagonal. Circumflex lesion was not flow limiting.  Will d/c home today. See medical therapy outlined above.    Lauree Chandler  02/21/2021 1:35 PM

## 2021-02-21 NOTE — Progress Notes (Signed)
PT Cancellation Note  Patient Details Name: Katherine [name] MRN: [id number] DOB: [birth year]   Cancelled Treatment:    Reason Eval/Treat Not Completed: Patient declined, no reason specified. Pt declining any mobility at this time, stating that she just wanted to finish eating and keeps getting interrupted. PT will continue to f/u with pt acutely as available.    [place] 02/21/2021, 11:17 AM

## 2021-02-21 NOTE — Progress Notes (Signed)
CARDIAC REHAB PHASE I   PRE:  Rate/Rhythm: 71 SR      SaO2: 95 3L  Came to pt's room at 0810. Pt stated to not feel up to walking and wanted to eat breakfast. Came back to pt's room at 0950 and pt stated to not feel up to walking and will try to get in physical activity when she gets home. Discussed NTG use, stent, restrictions, and nutrition. Stressed importance of Plavix & Eliquis. Encouraged walking/ physical activity. Pt stated she will try to walk around the house. Pt stated she has attended cardiac rehab before. Will refer [facility]. Doubtful pt will attend do to ortho problems. Notify NP for need of nitro prx.  [id number]  Sheppard Plumber, MS, ACM-CEP 02/21/2021 10:24 AM

## 2021-02-21 NOTE — Evaluation (Signed)
Physical Therapy Evaluation Patient Details Name: Katherine [name] MRN: [id number] DOB: [birth year] Today's Date: 02/21/2021   History of Present Illness  Pt is a 65 y/o female admitted secondary to chest pain. Pt is now s/p LEFT HEART CATH AND CORS/GRAFTS ANGIOGRAPHY, CORONARY ATHERECTOMY and PERCUTANEOUS CORONARY INTERVENTION. PMH including but not limited to CAD s/p CABG multiple PCIs, persistent atrial fib (eliquis and tikosyn), HLD, PAD, renal transplant '91, COPD and DM.   Clinical Impression  Pt presented seated in recliner chair, awake and willing to participate in therapy session. Prior to admission, pt reported that she was independent with all ADLs and uses a rollator to ambulate with as needed. She lives with her small dog in a single level home with a few steps to enter. At the time of evaluation, pt overall at a min guard level for transfers and short distance ambulation with use of RW. She required min A to return to bed. Pt was on 3L of supplemental O2 via [place] with SpO2 stable and >90% throughout. All VSS. Pt would continue to benefit from skilled physical therapy services at this time while admitted and after d/c to address the below listed limitations in order to improve overall safety and independence with functional mobility.     Follow Up Recommendations Home health PT (if she will agree to it)    Equipment Recommendations  None recommended by PT    Recommendations for Other Services       Precautions / Restrictions Precautions Precautions: Fall Restrictions Weight Bearing Restrictions: No      Mobility  Bed Mobility Overal bed mobility: Needs Assistance Bed Mobility: Sit to Supine       Sit to supine: Min assist   General bed mobility comments: increased time and effort needed, use of bed rails, assistance needed for L LE movement back onto bed    Transfers Overall transfer level: Needs assistance Equipment used: Rolling walker (2 wheeled) Transfers:  Sit to/from Stand Sit to Stand: Min guard         General transfer comment: performed from recliner chair, good technique, min guard for safety  Ambulation/Gait Ambulation/Gait assistance: Min guard Gait Distance (Feet): 20 Feet Assistive device: Rolling walker (2 wheeled) Gait Pattern/deviations: Step-through pattern;Decreased stride length Gait velocity: decreased   General Gait Details: pt with slow, steady gait with use of RW, min guarding for line management, pt with no instability or LOB, limited secondary to fatigue  Stairs            Wheelchair Mobility    Modified Rankin (Stroke Patients Only)       Balance Overall balance assessment: Needs assistance Sitting-balance support: Feet supported Sitting balance-Leahy Scale: Good     Standing balance support: During functional activity;Single extremity supported;Bilateral upper extremity supported Standing balance-Leahy Scale: Poor                               Pertinent Vitals/Pain Pain Assessment: No/denies pain    Home Living Family/patient expects to be discharged to:: Private residence Living Arrangements: Alone Available Help at Discharge: Family;Available PRN/intermittently Type of Home: Mobile home Home Access: Stairs to enter Entrance Stairs-Rails: Left Entrance Stairs-Number of Steps: 3 Home Layout: One level Home Equipment: Walker - 4 wheels;Cane - single point;Tub bench Additional Comments: wears 3L of supplemental O2 at home    Prior Function Level of Independence: Independent with assistive device(s)  Comments: uses rollator PRN     Hand Dominance        Extremity/Trunk Assessment   Upper Extremity Assessment Upper Extremity Assessment: Overall WFL for tasks assessed    Lower Extremity Assessment Lower Extremity Assessment: Overall WFL for tasks assessed       Communication   Communication: No difficulties  Cognition Arousal/Alertness:  Awake/alert Behavior During Therapy: WFL for tasks assessed/performed Overall Cognitive Status: Within Functional Limits for tasks assessed                                        General Comments      Exercises     Assessment/Plan    PT Assessment Patient needs continued PT services  PT Problem List Decreased strength;Decreased range of motion;Decreased activity tolerance;Decreased mobility;Decreased balance;Decreased coordination;Decreased knowledge of use of DME;Decreased safety awareness;Decreased knowledge of precautions;Cardiopulmonary status limiting activity       PT Treatment Interventions DME instruction;Gait training;Stair training;Functional mobility training;Therapeutic activities;Therapeutic exercise;Balance training;Neuromuscular re-education;Patient/family education    PT Goals (Current goals can be found in the Care Plan section)  Acute Rehab PT Goals Patient Stated Goal: "home today" PT Goal Formulation: With patient Time For Goal Achievement: 03/07/21 Potential to Achieve Goals: Good    Frequency Min 3X/week   Barriers to discharge        Co-evaluation               AM-PAC PT "6 Clicks" Mobility  Outcome Measure Help needed turning from your back to your side while in a flat bed without using bedrails?: None Help needed moving from lying on your back to sitting on the side of a flat bed without using bedrails?: A Little Help needed moving to and from a bed to a chair (including a wheelchair)?: A Little Help needed standing up from a chair using your arms (e.g., wheelchair or bedside chair)?: None Help needed to walk in hospital room?: A Little Help needed climbing 3-5 steps with a railing? : A Little 6 Click Score: 20    End of Session Equipment Utilized During Treatment: Oxygen Activity Tolerance: Patient limited by fatigue Patient left: in bed;with call bell/phone within reach;with bed alarm set Nurse Communication: Mobility  status PT Visit Diagnosis: Other abnormalities of gait and mobility (R26.89)    Time: 5597-4163 PT Time Calculation (min) (ACUTE ONLY): 20 min   Charges:   PT Evaluation $PT Eval Moderate Complexity: 1 Mod          Eduard Clos, PT, DPT  Acute Rehabilitation Services Office [place] 02/21/2021, 1:12 PM

## 2021-02-25 ENCOUNTER — Telehealth: Payer: Self-pay | Admitting: *Deleted

## 2021-02-25 NOTE — Chronic Care Management (AMB) (Signed)
  Chronic Care Management   Note  02/25/2021 Name: Shadavia Dampier MRN: [id number] DOB: [birth year]  Alexxis Mackert is a 65 y.o. year old female who is a primary care patient of [name], MD. I reached out to [name] by phone today in response to a referral sent by Ms. Linton Rump Pfahler's PCP [name], MD     Ms. Losier was given information about Chronic Care Management services today including:  CCM service includes personalized support from designated clinical staff supervised by her physician, including individualized plan of care and coordination with other care providers 24/7 contact phone numbers for assistance for urgent and routine care needs. Service will only be billed when office clinical staff spend 20 minutes or more in a month to coordinate care. Only one practitioner may furnish and bill the service in a calendar month. The patient may stop CCM services at any time (effective at the end of the month) by phone call to the office staff. The patient will be responsible for cost sharing (co-pay) of up to 20% of the service fee (after annual deductible is met).  Patient agreed to services and verbal consent obtained. Re consented 02/25/2021  Follow up plan: Telephone appointment with care management team member scheduled for:03/13/2021   Julian Hy, [place], [place] Management  Direct Dial: [phone]

## 2021-02-25 NOTE — Chronic Care Management (AMB) (Signed)
  Care Management   Note  02/25/2021 Name: Katherine [name] MRN: [id number] DOB: 10-Feb-1956  Katherine [name] is a 65 y.o. year old female who is a primary care patient of [name], MD and is actively engaged with the care management team. I reached out to [name] by phone today to assist with re-scheduling an initial visit with the RN Case Manager  Follow up plan: Unsuccessful telephone outreach attempt made. A HIPAA compliant phone message was left for the patient providing contact information and requesting a return call.  If patient returns call to provider office, please advise to call Embedded Care Management Care Guide Jenevie Casstevens at [place], [place] Management  Direct Dial: [phone]

## 2021-02-26 ENCOUNTER — Telehealth (HOSPITAL_COMMUNITY): Payer: Self-pay | Admitting: Pharmacist

## 2021-02-26 ENCOUNTER — Ambulatory Visit: Payer: Medicare Other

## 2021-02-26 ENCOUNTER — Other Ambulatory Visit (HOSPITAL_COMMUNITY): Payer: Self-pay

## 2021-02-26 ENCOUNTER — Telehealth: Payer: Self-pay

## 2021-02-26 DIAGNOSIS — M797 Fibromyalgia: Secondary | ICD-10-CM

## 2021-02-26 MED ORDER — OXYCODONE HCL 10 MG PO TABS: 10.0000 mg | ORAL_TABLET | Freq: Three times a day (TID) | ORAL | 0 refills | Status: DC | PRN

## 2021-02-26 NOTE — Telephone Encounter (Signed)
Katherine [name] stated she only has 2 day of pain medication on hand. If possible can you send in a refill? Her next appointment is on 03/12/2021. (Last fill was on 01/24/2021).   Patient stated she has had recent heart attacks and would like to speak to you. Call back phone [phone].  Thank you

## 2021-02-26 NOTE — Telephone Encounter (Signed)
Pharmacy Transitions of Care Follow-up Telephone Call  Date of discharge: 02/21/2021  Discharge Diagnosis: unstable angina  How have you been since you were released from the hospital? Good   Medication changes made at discharge:  - START: clopidogrel, pantoprazole  - STOPPED: EC ASA, omeprazole  - CHANGED: albuterol, duloxetine, Eliquis, fluticasone, lamotrigine, metoprolol, montelukast, oxycodone, promethazine  Medication changes verified by the patient? Yes (Yes/No)    Medication Accessibility:  Home [facility]   Was the patient provided with refills on discharged medications? Yes   Have all prescriptions been [facility] from [facility] to home pharmacy? Yes   Is the patient able to afford medications? N    Medication Review: CLOPIDOGREL (PLAVIX) Clopidogrel 75 mg once daily.  - Educated patient on expected duration of therapy of  with clopidogrel. Advised patient that aspirin will be continued indefinitely.  - Reviewed potential DDIs with patient  - Advised patient of medications to avoid (NSAIDs, ASA)  - Educated that Tylenol (acetaminophen) will be the preferred analgesic to prevent risk of bleeding  - Emphasized importance of monitoring for signs and symptoms of bleeding (abnormal bruising, prolonged bleeding, nose bleeds, bleeding from gums, discolored urine, black tarry stools)  - Advised patient to alert all providers of anticoagulation therapy prior to starting a new medication or having a procedure    Follow-up Appointments:  [facility] f/u appt confirmed?  Scheduled to see Laurann Montana on 03/03/2021 @ 8:15.   If their condition worsens, is the pt aware to call PCP or go to the Emergency Dept.? Yes  Final Patient Assessment: Patient reports she is doing well.  Reviewed medications and completed transfer.

## 2021-02-26 NOTE — Telephone Encounter (Signed)
Called patient and spoke to her. I filled medications considering her recent heart attacks and medical condition

## 2021-02-27 ENCOUNTER — Telehealth (HOSPITAL_COMMUNITY): Payer: Self-pay

## 2021-02-27 NOTE — Telephone Encounter (Signed)
Pt is not interested in the cardiac rehab program. Closed referral 

## 2021-03-02 NOTE — Progress Notes (Deleted)
Office Visit    Patient Name: Katherine [name] Date of Encounter: 03/02/2021  PCP:  [name], MD   [facility]  Cardiologist:  [name], MD  Advanced Practice Provider:  No care team member to display Electrophysiologist:  None   Chief Complaint    Katherine [name] is a 65 y.o. female with a hx of CAD s/p CABG and multiple PCIs, persistent atrial fibrillation on Eliquis and Tikosyn, HLD, PAD, renal transplant 1991, COPD on home O2, DM2 presents today for hospital follow up   Past Medical History    Past Medical History:  Diagnosis Date   Anemia    Anxiety    Bilateral carotid artery stenosis    Carotid duplex 09/2295: 9-89% LICA, 21-19% RICA, >41% RECA, f/u 1 yr suggested   CAD (coronary artery disease) of bypass graft 5/01; 3/'02, 8/'03, 10/'04; 1/15   PCI x 5 to SVG-D1    CAD in native artery 07/1993   3 Vessel Disease (LAD-D1 & RCA) -- CABG (Dx in setting of inferior STEMI-PTCA of RCA)   CAD S/P percutaneous coronary angioplasty    PCI to SVG-D1 insertion/native D1 x 4 = '01 -(S660 BMS 2.5 x 9 anastomosis- D1); '02 - distal overlap ACS Pixel 2.5 x 8  BMS; '03 distal/native ISR/Thrombosis - Pixel 2.5 x 13; '04 - ISR-  Taxus 2.5 x 20 (covered all);; 1/15 - mid SVG-D1 (50% distal ISR) - Promus P 2.75 x 20 -- 2.8 mm   COPD mixed type ([place])    Followed by Dr. Lamonte Sakai "pulmonologist said no COPD"   Depression    Depression with anxiety    Diabetes mellitus type 2 in obese ([place])    Diarrhea    started after cholecystectomy and mass removed from intestine   Dyslipidemia, goal LDL below 70    08/2012: TC 137, TG 200, HDL 32!, LDL 45; on statin (followed by Dr.Deterding)   ESRD (end stage renal disease) ([place]) 1991   s/p Cadaveric Renal Transplant [facility] - Dr. Jimmy Footman)    Family history of adverse reaction to anesthesia    mom's bp dropped during/after anesthesia   Fibromyalgia    GERD (gastroesophageal reflux disease)     Glomerulonephritis, chronic, rapidly progressive 1989   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 07/1993   Rescue PTCA of RCA -- referred for CABG.   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 Servando Snare: LIMA-LAD, SVG-bifurcatingD1, SVG-rPDA   History of kidney stones    History of stroke 2012   "right eye stroke- half blind now"   Hypertension associated with diabetes ([place])    Mild aortic stenosis by prior echocardiogram 07/2019   Echo:  Mild aortic stenosis (gradients: Mean 14.3 mmHg -peak 24.9 mmHg).   Morbid obesity ([facility])    MRSA (methicillin resistant staph aureus) culture positive    OSA (obstructive sleep apnea)    no longer on CPAP or home O2, states she doesn't need now after lap band   PAD (peripheral artery disease) ([place]) 08/2013   LEA Dopplers to be read by Dr. Fletcher Anon   PAF (paroxysmal atrial fibrillation) ([place]) 06/2014   Noted on CardioNet Monitor  - --> rhythm control with Tikosyn (Dr. Rayann Heman); converted from warfarin to apixaban for anticoagulation.   Pneumonia    Recurrent boils    Bilateral Groin   Rheumatoid arthritis (  [place])    Per Patient Report; associated with OA   S/p cadaver renal transplant 1991   [place]   Unstable angina ([place]) 5/01; 3/'02, 8/'03, 10/'04; 1/15   x 5 occurences since Inf-Post STEMI in 1994   Past Surgical History:  Procedure Laterality Date   ABDOMINAL AORTOGRAM N/A 04/21/2018   Procedure: ABDOMINAL AORTOGRAM;  Surgeon: [name], MD;  Location: [place] [facility];  Service: Cardiovascular;  Laterality: N/A;   CATHETER REMOVAL     CHOLECYSTECTOMY N/A 10/29/2014   Procedure: LAPAROSCOPIC CHOLECYSTECTOMY WITH INTRAOPERATIVE CHOLANGIOGRAM;  Surgeon: Excell Seltzer, MD;  Location: [facility];  Service: General;  Laterality: N/A;   CORONARY ANGIOPLASTY  1994   x5   CORONARY ARTERY BYPASS GRAFT  1995   LIMA-LAD, SVG-RPDA, SVG-D1   CORONARY STENT INTERVENTION N/A 02/20/2021    Procedure: CORONARY STENT INTERVENTION;  Surgeon: [name], MD;  Location: [place] [facility];  Service: Cardiovascular;  Laterality: N/A;   ESOPHAGOGASTRODUODENOSCOPY N/A 10/15/2016   Procedure: ESOPHAGOGASTRODUODENOSCOPY (EGD);  Surgeon: Wilford Corner, MD;  Location: [facility];  Service: Endoscopy;  Laterality: N/A;   I & D EXTREMITY Right 01/29/2018   Procedure: IRRIGATION AND DEBRIDEMENT THUMB;  Surgeon: Dayna Barker, MD;  Location: [place];  Service: Plastics;  Laterality: Right;   INCISE AND DRAIN ABCESS     INTRAVASCULAR PRESSURE WIRE/FFR STUDY N/A 02/20/2021   Procedure: INTRAVASCULAR PRESSURE WIRE/FFR STUDY;  Surgeon: [name], MD;  Location: [place] [facility];  Service: Cardiovascular;  Laterality: N/A;   KIDNEY TRANSPLANT  1991   KNEE ARTHROSCOPY WITH LATERAL MENISECTOMY Left 12/03/2017   Procedure: LEFT KNEE ARTHROSCOPY WITH LATERAL MENISECTOMY;  Surgeon: Earlie Server, MD;  Location: [place];  Service: Orthopedics;  Laterality: Left;   LAPAROSCOPIC GASTRIC BANDING  04/2004; 10/'09, 2/'10   Port Replacement x 2   LEFT HEART CATH AND CORONARY ANGIOGRAPHY N/A 02/20/2021   Procedure: LEFT HEART CATH AND CORONARY ANGIOGRAPHY;  Surgeon: [name], MD;  Location: [place] [facility];  Service: Cardiovascular;  Laterality: N/A;   LEFT HEART CATH AND CORS/GRAFTS ANGIOGRAPHY N/A 04/21/2018   Procedure: LEFT HEART CATH AND CORS/GRAFTS ANGIOGRAPHY;  Surgeon: [name], MD;  Location: [place] [facility];  Ost-Prox LAD 50% - proxLAD (pre & post D1) 100% CTO. Cx - patent, small OM1 (stable ~ ostial OM1 90%, too small for PCI) & 2 small LPL; Ost-distal RCA 100% CTO.  LIMA-LAD (not injected); SVG-dRCA patent, SVG-D1 - insertion stent ~20% ISR - Severe R CFA disease w/ focal Sub TO   LEFT HEART CATH AND CORS/GRAFTS ANGIOGRAPHY  5/'01, 3/'02, 8/'03, 10/'04; 1/'15   08/22/2013: LAD & RCA 100%; LIMA-LAD & SVG-rPDA patent; Cx-- OM1 60%, OM2 ostial ~50%; SVG-D1 - 80% mid, 50% distal  ISR --PCI   LEFT HEART CATH AND CORS/GRAFTS ANGIOGRAPHY N/A 01/31/2021   Procedure: LEFT HEART CATH AND CORS/GRAFTS ANGIOGRAPHY;  Surgeon: Jolaine Artist, MD;  Location: [place] [facility];  Service: Cardiovascular;  Laterality: N/A;   LEFT HEART CATHETERIZATION WITH CORONARY/GRAFT ANGIOGRAM N/A 08/23/2013   Procedure: LEFT HEART CATHETERIZATION WITH Beatrix Fetters;  Surgeon: Wellington Hampshire, MD;  Location: [place] [facility];  Service: Cardiovascular;  Laterality: N/A;   Lower Extremity Arterial Dopplers  08/2013   ABI: R 0.96, L 1.04   MULTIPLE TOOTH EXTRACTIONS  age 4   NM MYOVIEW LTD  03/2016   EF 62%. LOW RISK. C/W prior MI - no Ischemia. Apical hypokinesis.   PERCUTANEOUS CORONARY  STENT INTERVENTION (PCI-S)  5/'01, 3/'02, 8/'03, 10/'04;   '01 - S660 BMS 2.5 x 9 - dSVG-D1 into D1; '02- post-stent stenosis - 2.5 x 8 Pixel BMS; '8\03: ISR/Thrombosis into native D1 - AngioJet, 2.5 x 13 Pixel; '04 - ISR 95% - covered stented area with Taxus DES 2.5 mm x 20 (2.88)   PERCUTANEOUS CORONARY STENT INTERVENTION (PCI-S)  08/23/2013   Procedure: PERCUTANEOUS CORONARY STENT INTERVENTION (PCI-S);  Surgeon: Wellington Hampshire, MD;  Location: [facility] CATH LAB;  Service: Cardiovascular;;mid SVG-D1 80%; distal stent ~50% ISR; Promus [place] DES 2.75 mm xc 20 mm (2.8 mm)   PORT-A-CATH REMOVAL     kidney   TRANSTHORACIC ECHOCARDIOGRAM  07/2019   EF 55 to 60%.  No LVH.  Paradoxical septal wall motion-post CABG.  GRII DD.  Normal RV size and function.  Mild bilateral atrial dilation.  Moderate MAC.  Trace MR.  Mild aortic stenosis (gradients: Mean 14.3 mmHg -peak 24.9 mmHg).   TUBAL LIGATION     wrist fistula repair Left    dialysis for one year    Allergies  Allergies  Allergen Reactions   Tetracycline Hives    Patient tolerated Doxycycline Dec 2020   Niacin Other (See Comments)    Mouth blisters   Niaspan [Niacin Er] Other (See Comments)    Mouth blisters   Sulfa Antibiotics Nausea Only and Other  (See Comments)    "Tears up stomach"   Sulfonamide Derivatives Other (See Comments)    Reaction: per patient "tears her stomach up"   Codeine Nausea And Vomiting   Erythromycin Nausea And Vomiting   Hydromorphone Hcl Nausea And Vomiting   Morphine And Related Nausea And Vomiting   Nalbuphine Nausea And Vomiting    Nubain   Sulfasalazine Nausea Only and Other (See Comments)    per patient "tears her stomach up", "Tears up stomach"   Tape Rash and Other (See Comments)    No "plastic" tape," please----cloth tape only    History of Present Illness    Katherine [name] is a 65 y.o. female with a hx of *** last seen while hospitalized.  She had a previous anterior wall MI in 1994 with PTCA. She had CABG 1995 with LIMA-LAD, SVG-diagonal, SVG-dRCA. In 2001 she had stent to SVG-diagonal, and in 2002 required overlapping stenting to the same area. In 2003 due to recurrent angina and stenosis underwent thrombectomy and tandem stenting. In 2004 she had PCI of SVG-diagonal at distal anastomotic site with Taxus stent. In 2015 she had stenosis to SVG-diagonal proximal to prior stenting. Echocardiogram in 2019 with LVEF 55-60% with grade 2 diastolic dysfunction and mild aortic stenosis.   She had cardiac MRI 12/2019 for possible myocarditis which was negative with normal aortic root. EF 56% with hypokinesis of mid/anterior septal, inferior, apical septal walls.   She was admitted 01/29/21 with chest pain. Cardiac catheterization 02/03/21 severe native CAD with CTO of LAD and RCA, LCx calcified 60% ostial lesion which was new. LIMA to LAD not visualized via R radial access. SVG-D2 recurrent high grade 90% ISR with 95% lesion in D2 after graft insertion. She was recommended for intensification of medical therapy and future consideration of Flow Wire interrogation of ostial circumlflex with repeat PCI of SVG-D2. Treated with aspirin, metoprolol 140m BID, crestor 40QD, Imdur 382mQD was added. Echo during  admission with LVEF 55-60%, gr3DD.  She represented to the ED 02/19/21 with chest pain. She underwent successful DES/PCI of 95% lesion in D2 after graft  insertion along with borderline successful PTCA of ISR of the body of the graft. 60% calcified lesion in ostial circumflex, RFR negative. Plavix was continued as monotherapy given need for Eliquis.   She presents today for follow up. ***     EKGs/Labs/Other Studies Reviewed:   The following studies were reviewed today: Cath: 02/20/21   Mid RCA to Dist RCA lesion is 100% stenosed. Ost LAD to Prox LAD lesion is 50% stenosed. Prox LAD-1 lesion is 100% stenosed. Prox LAD-2 lesion is 100% stenosed. Ost Cx to Prox Cx lesion is 60% stenosed -focal eccentric heavily calcified lesion -> negative RFR (0.96) 1st Mrg lesion is 40% stenosed. 2nd Mrg lesion is 80% stenosed - focal SVG-RCA graft was visualized by angiography and is large. The graft exhibits minimal luminal irregularities. SCG-RCA: Mid Graft to Dist Graft lesion is 40% stenosed. LIMA graft was not visualized due to inability to cannulate. ------CULPRIT LESIONS - SVG-DIAG ISR & Native Diag 95% ------- SVG-Diag: Prox Graft to Mid Graft Stent is 10% stenosed. SVG-Diag: Dist Graft to Insertion Stent is 90% stenosed. Scoring balloon angioplasty was performed using a BALLOON SCOREFLEX 2.50X10. Followed by 2.75 mm [place] balloon post dilation Post intervention, there is a 25% residual stenosis. Ost 1st Diag Stent (from Graft into native) is 80% re-stenosed. Native 1st Diag (immediately after the sten) is 95% stenosed. A drug-eluting stent was successfully placed overlapping from the distal stent edge crossing the lesion, using a STENT ONYX FRONTIER 2.25X12. -> Postdilated at the overlap segment to 2.6 mm, and distally to 2.4 mm -------------------------------------------- Post intervention, there is a 15% residual stenosis at the 80% site, and 0% residual stenosis at the 95% site.. LV end diastolic  pressure is moderate-severely elevated.   SUMMARY: Stable findings Stable Severe native CAD with chronic total occlusion of LAD and RCA LCx calcified 60% ostial lesion - RFR negative (0.96); PCI deferred. LIMA to LAD not visualized as only access was right radial SVG to RCA 40% - confirmed SVG to D2 recurrent high-grade 90% ISR with 95% lesion in D2 after graft insertion Successful DES PCI of 95% lesion in D2 after graft insertion and recent stent with Onyx Frontier 2.25 mm x 12 mm postdilated to 2.5 mm distally and 2.75 mm at the overlap. Borderline successful PTCA of in-stent restenosis in the body of graft. ->  2.5 mm scoring balloon followed by post dilation with a 2.75 mm [place] balloon     RECOMMENDATIONS Overnight observation on 6 E. post procedure unit. ->  Difficult TR band management. She will restart Eliquis tonight, and continue with clopidogrel without aspirin. If stable, anticipate potential discharge tomorrow After discussion with the patient's close family member, would consider discussing possibility of home health nurse/physical therapy to assist during the patient's recovery.   [name], MD   Diagnostic Dominance: Right      Intervention      _____________  EKG:  EKG is ordered today.  The ekg ordered today demonstrates ***  Recent Labs: 01/29/2021: B Natriuretic Peptide 352.2; TSH 1.176 02/19/2021: ALT 12 02/21/2021: BUN 7; Creatinine, Ser 0.81; Hemoglobin 10.3; Magnesium 1.8; Platelets 211; Potassium 3.6; Sodium 139  Recent Lipid Panel    Component Value Date/Time   CHOL 99 02/20/2021 0125   TRIG 165 (H) 02/20/2021 0125   HDL 51 02/20/2021 0125   CHOLHDL 1.9 02/20/2021 0125   VLDL 33 02/20/2021 0125   LDLCALC 15 02/20/2021 0125    Home Medications   No outpatient medications have been marked  as taking for the 03/03/21 encounter (Appointment) with Loel Dubonnet, NP.     Review of Systems      All other systems reviewed and are otherwise  negative except as noted above.  Physical Exam    VS:  There were no vitals taken for this visit. , BMI There is no height or weight on file to calculate BMI.  Wt Readings from Last 3 Encounters:  02/20/21 233 lb 4 oz (105.8 kg)  02/03/21 228 lb 9.9 oz (103.7 kg)  12/25/20 210 lb (95.3 kg)     GEN: Well nourished, well developed, in no acute distress. HEENT: normal. Neck: Supple, no JVD, carotid bruits, or masses. Cardiac: ***RRR, no murmurs, rubs, or gallops. No clubbing, cyanosis, edema.  ***Radials/PT 2+ and equal bilaterally.  Respiratory:  ***Respirations regular and unlabored, clear to auscultation bilaterally. GI: Soft, nontender, nondistended. MS: No deformity or atrophy. Skin: Warm and dry, no rash. Neuro:  Strength and sensation are intact. Psych: Normal affect.  Assessment & Plan    CAD -   Chronic combined systolic and diastolic heart failure - Most recent echo LVEF 55-60%, gr3DD.   PAF / High risk medication use / Chronic anticoagulation -   ESRD s/p renal transplant -   HLD, LDL goal <70 -   DM2 -   COPD on home O2 -   Disposition: Follow up {follow up:15908} with Dr. Ellyn Hack or APP.  Signed, Loel Dubonnet, NP 03/02/2021, 3:12 PM [place]

## 2021-03-03 ENCOUNTER — Ambulatory Visit (HOSPITAL_BASED_OUTPATIENT_CLINIC_OR_DEPARTMENT_OTHER): Payer: Medicare Other | Admitting: Family

## 2021-03-04 ENCOUNTER — Other Ambulatory Visit (HOSPITAL_COMMUNITY): Payer: Self-pay

## 2021-03-04 ENCOUNTER — Other Ambulatory Visit: Payer: Self-pay | Admitting: Physical Medicine & Rehabilitation

## 2021-03-04 ENCOUNTER — Telehealth (HOSPITAL_COMMUNITY): Payer: Self-pay

## 2021-03-04 DIAGNOSIS — I480 Paroxysmal atrial fibrillation: Secondary | ICD-10-CM

## 2021-03-04 MED ORDER — DILTIAZEM HCL 30 MG PO TABS: ORAL_TABLET | ORAL | 0 refills | Status: DC

## 2021-03-04 MED ORDER — DOFETILIDE 250 MCG PO CAPS: 250.0000 ug | ORAL_CAPSULE | Freq: Two times a day (BID) | ORAL | 0 refills | Status: DC

## 2021-03-04 NOTE — Telephone Encounter (Signed)
Patient called and states she needs refills on her Cardizem 30mg  tablet. She has been taking this medication daily for palpitations and at times she feels like she is going in and out of A-fib. Advised patient to only take this medication as needed and not daily per Roderic Palau NP. She is scheduled to see Roderic Palau NP on 03/31/21 @ 2:30pm. Erxed her Cardizem 30mg , quantity 30 to her pharmacy with no refills. Patient was given the parking code to access the garage and she verbalized understanding.

## 2021-03-12 ENCOUNTER — Encounter: Payer: Self-pay | Admitting: Physical Medicine & Rehabilitation

## 2021-03-12 ENCOUNTER — Encounter: Payer: Medicare Other | Attending: Physical Medicine & Rehabilitation | Admitting: Physical Medicine & Rehabilitation

## 2021-03-12 ENCOUNTER — Other Ambulatory Visit: Payer: Self-pay

## 2021-03-12 VITALS — BP 147/81 | HR 70 | Temp 98.3°F | Ht 61.0 in | Wt 233.0 lb

## 2021-03-12 DIAGNOSIS — M797 Fibromyalgia: Secondary | ICD-10-CM | POA: Insufficient documentation

## 2021-03-12 DIAGNOSIS — M1711 Unilateral primary osteoarthritis, right knee: Secondary | ICD-10-CM | POA: Insufficient documentation

## 2021-03-12 DIAGNOSIS — N390 Urinary tract infection, site not specified: Secondary | ICD-10-CM | POA: Insufficient documentation

## 2021-03-12 DIAGNOSIS — E1142 Type 2 diabetes mellitus with diabetic polyneuropathy: Secondary | ICD-10-CM | POA: Insufficient documentation

## 2021-03-12 DIAGNOSIS — A499 Bacterial infection, unspecified: Secondary | ICD-10-CM | POA: Insufficient documentation

## 2021-03-12 DIAGNOSIS — I200 Unstable angina: Secondary | ICD-10-CM | POA: Diagnosis not present

## 2021-03-12 DIAGNOSIS — I25110 Atherosclerotic heart disease of native coronary artery with unstable angina pectoris: Secondary | ICD-10-CM | POA: Insufficient documentation

## 2021-03-12 DIAGNOSIS — M25552 Pain in left hip: Secondary | ICD-10-CM | POA: Insufficient documentation

## 2021-03-12 MED ORDER — OXYCODONE HCL 10 MG PO TABS: 10.0000 mg | ORAL_TABLET | Freq: Three times a day (TID) | ORAL | 0 refills | Status: DC | PRN

## 2021-03-12 MED ORDER — AMOXICILLIN 250 MG PO CAPS: 250.0000 mg | ORAL_CAPSULE | Freq: Three times a day (TID) | ORAL | 0 refills | Status: DC

## 2021-03-12 NOTE — Patient Instructions (Signed)
PLEASE FEEL FREE TO CALL OUR OFFICE WITH ANY PROBLEMS OR QUESTIONS ([phone])      

## 2021-03-12 NOTE — Progress Notes (Signed)
Subjective:    Patient ID: Katherine [name], female    DOB: 06/04/1956, 65 y.o.   MRN: [id number]  HPI  Katherine [name] is here in follow up of her chronic pain. She suffered an MI and had a cardiac cath. She was found to have severe CAD with chronic total occlusion of her LAD/RCA amongst other abnormalities. She underwent PTCA and stenting. She was discharged from the hospital earlier this month on eliquis and plavix currently  She has been more limited with her mobility since being home.  She receives home health nursing briefly but they have signed off.  She has never received home therapies.  She is essentially limited to her wheelchair now.  Her niece is helping her somewhat with visits and some transportation but she lives out of town.  From a pain standpoint she is complaining of pain in her left inguinal area.  She states that she had some of it prior to being in the hospital.  She did not have any catheterization there.  The leg seems to bother her with transfers and sometimes even when she moves in bed.  For pain she is using her oxycodone 10 mg up to 3 times daily.  She is also on Cymbalta 60 mg daily.  She also has Flexeril as needed for spasms  From a mood standpoint she remains on the Cymbalta as well as Zoloft.  She is under a lot of stress given her medical conditions and realizes that her medical conditions are significant.  She does feel that her medications are helping to keep her mood somewhat stable.   Pain Inventory Average Pain 8 Pain Right Now 6 My pain is constant, burning, stabbing, tingling, and aching  In the last 24 hours, has pain interfered with the following? General activity 10 Relation with others 10 Enjoyment of life 10 What TIME of day is your pain at its worst? morning  Sleep (in general) Fair  Pain is worse with: walking, bending, sitting, inactivity, and standing Pain improves with: rest, heat/ice, medication, and injections Relief from Meds:  not  sure  Family History  Problem Relation Age of Onset   Cancer Mother        liver   Heart disease Father    Cancer Father        colon   Arrhythmia Brother        Atrial Fibrillation   Arrhythmia Paternal Aunt        Atrial Fibrillation   Social History   Socioeconomic History   Marital status: Widowed    Spouse name: Not on file   Number of children: Not on file   Years of education: Not on file   Highest education level: Not on file  Occupational History   Not on file  Tobacco Use   Smoking status: Former    Packs/day: 1.00    Years: 30.00    Pack years: 30.00    Types: Cigarettes    Quit date: 08/17/2002    Years since quitting: 18.5   Smokeless tobacco: Never  Vaping Use   Vaping Use: Never used  Substance and Sexual Activity   Alcohol use: No   Drug use: No   Sexual activity: Not on file  Other Topics Concern   Not on file  Social History Narrative   She is currently married, and the caregiver of her husband who is recovering from surgery for tongue cancer now diagnosed with lung cancer. Prior to his diagnosis  of her husband, she actually had adopted a 75-year-old child who she knows caring for as well. With all the surgeries, they have been quite financially troubled. Thanks the help of her community and church, they have been able to stay "alfoat."     She is a former smoker who quit in 2004 after a 30-pack-year history.   She is active chasing a 48-year-old child, does not do routine exercise. She's been quite depressed with the condition of her husband, and admits to eating comfort herself.   She does not drink alcohol.   Social Determinants of Health   Financial Resource Strain: Not on file  Food Insecurity: Not on file  Transportation Needs: Not on file  Physical Activity: Not on file  Stress: Not on file  Social Connections: Not on file   Past Surgical History:  Procedure Laterality Date   ABDOMINAL AORTOGRAM N/A 04/21/2018   Procedure: ABDOMINAL  AORTOGRAM;  Surgeon: [name], MD;  Location: [place] [facility];  Service: Cardiovascular;  Laterality: N/A;   CATHETER REMOVAL     CHOLECYSTECTOMY N/A 10/29/2014   Procedure: LAPAROSCOPIC CHOLECYSTECTOMY WITH INTRAOPERATIVE CHOLANGIOGRAM;  Surgeon: Excell Seltzer, MD;  Location: [facility];  Service: General;  Laterality: N/A;   CORONARY ANGIOPLASTY  1994   x5   CORONARY ARTERY BYPASS GRAFT  1995   LIMA-LAD, SVG-RPDA, SVG-D1   CORONARY STENT INTERVENTION N/A 02/20/2021   Procedure: CORONARY STENT INTERVENTION;  Surgeon: [name], MD;  Location: [place] [facility];  Service: Cardiovascular;  Laterality: N/A;   ESOPHAGOGASTRODUODENOSCOPY N/A 10/15/2016   Procedure: ESOPHAGOGASTRODUODENOSCOPY (EGD);  Surgeon: Wilford Corner, MD;  Location: [facility];  Service: Endoscopy;  Laterality: N/A;   I & D EXTREMITY Right 01/29/2018   Procedure: IRRIGATION AND DEBRIDEMENT THUMB;  Surgeon: Dayna Barker, MD;  Location: [place];  Service: Plastics;  Laterality: Right;   INCISE AND DRAIN ABCESS     INTRAVASCULAR PRESSURE WIRE/FFR STUDY N/A 02/20/2021   Procedure: INTRAVASCULAR PRESSURE WIRE/FFR STUDY;  Surgeon: [name], MD;  Location: [place] [facility];  Service: Cardiovascular;  Laterality: N/A;   KIDNEY TRANSPLANT  1991   KNEE ARTHROSCOPY WITH LATERAL MENISECTOMY Left 12/03/2017   Procedure: LEFT KNEE ARTHROSCOPY WITH LATERAL MENISECTOMY;  Surgeon: Earlie Server, MD;  Location: [place];  Service: Orthopedics;  Laterality: Left;   LAPAROSCOPIC GASTRIC BANDING  04/2004; 10/'09, 2/'10   Port Replacement x 2   LEFT HEART CATH AND CORONARY ANGIOGRAPHY N/A 02/20/2021   Procedure: LEFT HEART CATH AND CORONARY ANGIOGRAPHY;  Surgeon: [name], MD;  Location: [place] [facility];  Service: Cardiovascular;  Laterality: N/A;   LEFT HEART CATH AND CORS/GRAFTS ANGIOGRAPHY N/A 04/21/2018   Procedure: LEFT HEART CATH AND CORS/GRAFTS ANGIOGRAPHY;  Surgeon: [name], MD;  Location: [place] [facility];  Ost-Prox LAD 50% - proxLAD (pre & post D1) 100% CTO. Cx - patent, small OM1 (stable ~ ostial OM1 90%, too small for PCI) & 2 small LPL; Ost-distal RCA 100% CTO.  LIMA-LAD (not injected); SVG-dRCA patent, SVG-D1 - insertion stent ~20% ISR - Severe R CFA disease w/ focal Sub TO   LEFT HEART CATH AND CORS/GRAFTS ANGIOGRAPHY  5/'01, 3/'02, 8/'03, 10/'04; 1/'15   08/22/2013: LAD & RCA 100%; LIMA-LAD & SVG-rPDA patent; Cx-- OM1 60%, OM2 ostial ~50%; SVG-D1 - 80% mid, 50% distal ISR --PCI   LEFT HEART CATH AND CORS/GRAFTS ANGIOGRAPHY N/A 01/31/2021   Procedure: LEFT HEART CATH AND CORS/GRAFTS ANGIOGRAPHY;  Surgeon: Haroldine Laws,  Shaune Pascal, MD;  Location: [place] [facility];  Service: Cardiovascular;  Laterality: N/A;   LEFT HEART CATHETERIZATION WITH CORONARY/GRAFT ANGIOGRAM N/A 08/23/2013   Procedure: LEFT HEART CATHETERIZATION WITH Beatrix Fetters;  Surgeon: Wellington Hampshire, MD;  Location: [place] [facility];  Service: Cardiovascular;  Laterality: N/A;   Lower Extremity Arterial Dopplers  08/2013   ABI: R 0.96, L 1.04   MULTIPLE TOOTH EXTRACTIONS  age 14   NM MYOVIEW LTD  03/2016   EF 62%. LOW RISK. C/W prior MI - no Ischemia. Apical hypokinesis.   PERCUTANEOUS CORONARY STENT INTERVENTION (PCI-S)  5/'01, 3/'02, 8/'03, 10/'04;   '01 - S660 BMS 2.5 x 9 - dSVG-D1 into D1; '02- post-stent stenosis - 2.5 x 8 Pixel BMS; '8\03: ISR/Thrombosis into native D1 - AngioJet, 2.5 x 13 Pixel; '04 - ISR 95% - covered stented area with Taxus DES 2.5 mm x 20 (2.88)   PERCUTANEOUS CORONARY STENT INTERVENTION (PCI-S)  08/23/2013   Procedure: PERCUTANEOUS CORONARY STENT INTERVENTION (PCI-S);  Surgeon: Wellington Hampshire, MD;  Location: [facility] C[facility] LAB;  Service: Cardiovascular;;mid SVG-D1 80%; distal stent ~50% ISR; Promus Prermier DES 2.75 mm xc 20 mm (2.8 mm)   PORT-A-CATH REMOVAL     kidney   TRANSTHORACIC ECHOCARDIOGRAM  07/2019   EF 55 to 60%.  No LVH.  Paradoxical septal wall motion-post CABG.  GRII DD.  Normal RV  size and function.  Mild bilateral atrial dilation.  Moderate MAC.  Trace MR.  Mild aortic stenosis (gradients: Mean 14.3 mmHg -peak 24.9 mmHg).   TUBAL LIGATION     wrist fistula repair Left    dialysis for one year   Past Surgical History:  Procedure Laterality Date   ABDOMINAL AORTOGRAM N/A 04/21/2018   Procedure: ABDOMINAL AORTOGRAM;  Surgeon: [name], MD;  Location: [place] [facility];  Service: Cardiovascular;  Laterality: N/A;   CATHETER REMOVAL     CHOLECYSTECTOMY N/A 10/29/2014   Procedure: LAPAROSCOPIC CHOLECYSTECTOMY WITH INTRAOPERATIVE CHOLANGIOGRAM;  Surgeon: Excell Seltzer, MD;  [facility];  Service: General;  Laterality: N/A;   CORONARY ANGIOPLASTY  1994   x5   CORONARY ARTERY BYPASS GRAFT  1995   LIMA-LAD, SVG-RPDA, SVG-D1   CORONARY STENT INTERVENTION N/A 02/20/2021   Procedure: CORONARY STENT INTERVENTION;  Surgeon: [name], MD;  Location: [place] [facility];  Service: Cardiovascular;  Laterality: N/A;   ESOPHAGOGASTRODUODENOSCOPY N/A 10/15/2016   Procedure: ESOPHAGOGASTRODUODENOSCOPY (EGD);  Surgeon: Wilford Corner, MD;  Location: [facility]OSCOPY;  Service: Endoscopy;  Laterality: N/A;   I & D EXTREMITY Right 01/29/2018   Procedure: IRRIGATION AND DEBRIDEMENT THUMB;  Surgeon: Dayna Barker, MD;  Location: [place];  Service: Plastics;  Laterality: Right;   INCISE AND DRAIN ABCESS     INTRAVASCULAR PRESSURE WIRE/FFR STUDY N/A 02/20/2021   Procedure: INTRAVASCULAR PRESSURE WIRE/FFR STUDY;  Surgeon: [name], MD;  Location: [place] [facility];  Service: Cardiovascular;  Laterality: N/A;   KIDNEY TRANSPLANT  1991   KNEE ARTHROSCOPY WITH LATERAL MENISECTOMY Left 12/03/2017   Procedure: LEFT KNEE ARTHROSCOPY WITH LATERAL MENISECTOMY;  Surgeon: Earlie Server, MD;  Location: [place];  Service: Orthopedics;  Laterality: Left;   LAPAROSCOPIC GASTRIC BANDING  04/2004; 10/'09, 2/'10   Port Replacement x 2   LEFT HEART CATH AND CORONARY ANGIOGRAPHY N/A 02/20/2021    Procedure: LEFT HEART CATH AND CORONARY ANGIOGRAPHY;  Surgeon: [name], MD;  Location: [place] [facility];  Service: Cardiovascular;  Laterality: N/A;   LEFT  HEART CATH AND CORS/GRAFTS ANGIOGRAPHY N/A 04/21/2018   Procedure: LEFT HEART CATH AND CORS/GRAFTS ANGIOGRAPHY;  Surgeon: [name], MD;  Location: [place] [facility];  Ost-Prox LAD 50% - proxLAD (pre & post D1) 100% CTO. Cx - patent, small OM1 (stable ~ ostial OM1 90%, too small for PCI) & 2 small LPL; Ost-distal RCA 100% CTO.  LIMA-LAD (not injected); SVG-dRCA patent, SVG-D1 - insertion stent ~20% ISR - Severe R CFA disease w/ focal Sub TO   LEFT HEART CATH AND CORS/GRAFTS ANGIOGRAPHY  5/'01, 3/'02, 8/'03, 10/'04; 1/'15   08/22/2013: LAD & RCA 100%; LIMA-LAD & SVG-rPDA patent; Cx-- OM1 60%, OM2 ostial ~50%; SVG-D1 - 80% mid, 50% distal ISR --PCI   LEFT HEART CATH AND CORS/GRAFTS ANGIOGRAPHY N/A 01/31/2021   Procedure: LEFT HEART CATH AND CORS/GRAFTS ANGIOGRAPHY;  Surgeon: Jolaine Artist, MD;  Location: [place] [facility];  Service: Cardiovascular;  Laterality: N/A;   LEFT HEART CATHETERIZATION WITH CORONARY/GRAFT ANGIOGRAM N/A 08/23/2013   Procedure: LEFT HEART CATHETERIZATION WITH Beatrix Fetters;  Surgeon: Wellington Hampshire, MD;  Location: [place] [facility];  Service: Cardiovascular;  Laterality: N/A;   Lower Extremity Arterial Dopplers  08/2013   ABI: R 0.96, L 1.04   MULTIPLE TOOTH EXTRACTIONS  age 89   NM MYOVIEW LTD  03/2016   EF 62%. LOW RISK. C/W prior MI - no Ischemia. Apical hypokinesis.   PERCUTANEOUS CORONARY STENT INTERVENTION (PCI-S)  5/'01, 3/'02, 8/'03, 10/'04;   '01 - S660 BMS 2.5 x 9 - dSVG-D1 into D1; '02- post-stent stenosis - 2.5 x 8 Pixel BMS; '8\03: ISR/Thrombosis into native D1 - AngioJet, 2.5 x 13 Pixel; '04 - ISR 95% - covered stented area with Taxus DES 2.5 mm x 20 (2.88)   PERCUTANEOUS CORONARY STENT INTERVENTION (PCI-S)  08/23/2013   Procedure: PERCUTANEOUS CORONARY STENT INTERVENTION (PCI-S);   Surgeon: Wellington Hampshire, MD;  Location[facility];  Service: Cardiovascular;;mid SVG-D1 80%; distal stent ~50% ISR; Promus Prermier DES 2.75 mm xc 20 mm (2.8 mm)   PORT-A-CATH REMOVAL     kidney   TRANSTHORACIC ECHOCARDIOGRAM  07/2019   EF 55 to 60%.  No LVH.  Paradoxical septal wall motion-post CABG.  GRII DD.  Normal RV size and function.  Mild bilateral atrial dilation.  Moderate MAC.  Trace MR.  Mild aortic stenosis (gradients: Mean 14.3 mmHg -peak 24.9 mmHg).   TUBAL LIGATION     wrist fistula repair Left    dialysis for one year   Past Medical History:  Diagnosis Date   Anemia    Anxiety    Bilateral carotid artery stenosis    Carotid duplex 12/91: 8-18% LICA, 29-93% RICA, >71% RECA, f/u 1 yr suggested   CAD (coronary artery disease) of bypass graft 5/01; 3/'02, 8/'03, 10/'04; 1/15   PCI x 5 to SVG-D1    CAD in native artery 07/1993   3 Vessel Disease (LAD-D1 & RCA) -- CABG (Dx in setting of inferior STEMI-PTCA of RCA)   CAD S/P percutaneous coronary angioplasty    PCI to SVG-D1 insertion/native D1 x 4 = '01 -(S660 BMS 2.5 x 9 anastomosis- D1); '02 - distal overlap ACS Pixel 2.5 x 8  BMS; '03 distal/native ISR/Thrombosis - Pixel 2.5 x 13; '04 - ISR-  Taxus 2.5 x 20 (covered all);; 1/15 - mid SVG-D1 (50% distal ISR) - Promus P 2.75 x 20 -- 2.8 mm   COPD mixed type (HCC)    Followed by Dr. Lamonte Sakai "pulmonologist said no  COPD"   Depression    Depression with anxiety    Diabetes mellitus type 2 in obese ([place])    Diarrhea    started after cholecystectomy and mass removed from intestine   Dyslipidemia, goal LDL below 70    08/2012: TC 137, TG 200, HDL 32!, LDL 45; on statin (followed by Dr.Deterding)   ESRD (end stage renal disease) ([place]) 1991   s/p Cadaveric Renal Transplant [facility] - Dr. Jimmy Footman)    Family history of adverse reaction to anesthesia    mom's bp dropped during/after anesthesia   Fibromyalgia    GERD (gastroesophageal reflux disease)    Glomerulonephritis,  chronic, rapidly progressive 1989   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 07/1993   Rescue PTCA of RCA -- referred for CABG.   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 Servando Snare: LIMA-LAD, SVG-bifurcatingD1, SVG-rPDA   History of kidney stones    History of stroke 2012   "right eye stroke- half blind now"   Hypertension associated with diabetes ([place])    Mild aortic stenosis by prior echocardiogram 07/2019   Echo:  Mild aortic stenosis (gradients: Mean 14.3 mmHg -peak 24.9 mmHg).   Morbid obesity (HCC)    MRSA (methicillin resistant staph aureus) culture positive    OSA (obstructive sleep apnea)    no longer on CPAP or home O2, states she doesn't need now after lap band   PAD (peripheral artery disease) ([place]) 08/2013   LEA Dopplers to be read by Dr. Fletcher Anon   PAF (paroxysmal atrial fibrillation) ([place]) 06/2014   Noted on CardioNet Monitor  - --> rhythm control with Tikosyn (Dr. Rayann Heman); converted from warfarin to apixaban for anticoagulation.   Pneumonia    Recurrent boils    Bilateral Groin   Rheumatoid arthritis ([place])    Per Patient Report; associated with OA   S/p cadaver renal transplant 1991   [facility]   Unstable angina ([place]) 5/01; 3/'02, 8/'03, 10/'04; 1/15   x 5 occurences since Inf-Post STEMI in 1994   BP (!) 147/81 (BP Location: Left Arm)   Pulse 70   Temp 98.3 F (36.8 C)   Ht _0  (1.549 m)   Wt 233 lb (105.7 kg)   SpO2 (!) 86%   BMI 44.02 kg/m   Opioid Risk Score:   Fall Risk Score:  `1  Depression screen PHQ 2/9  Depression screen [facility] 2/9 12/25/2020 01/04/2020  Decreased Interest 2 0  Down, Depressed, Hopeless 2 1  PHQ - 2 Score 4 1  Some recent data might be hidden      Review of Systems  Constitutional: Negative.   HENT: Negative.    Eyes: Negative.   Respiratory: Negative.    Cardiovascular: Negative.   Gastrointestinal: Negative.   Endocrine: Negative.   Genitourinary:  Negative.   Musculoskeletal:  Positive for back pain, gait problem and neck pain.       Right and left shoulder pain , pain in the groin , pain in buttocks, pain in both feet.   Skin: Negative.   Allergic/Immunologic: Negative.   Hematological: Negative.   Psychiatric/Behavioral: Negative.        Objective:   Physical Exam  Gen: no distress, obese,  HEENT: oral mucosa pink and moist, NCAT Cardio: Reg rate Chest: normal effort, normal rate of breathing, O2 [place] Abd: soft, non-distended Ext: no edema Psych: pleasant, normal affect  Skin: intact Neuro: Alert and oriented x 3. Normal insight and awareness. Intact Memory. Normal language and speech. Cranial nerve exam unremarkable. UE 5/5. LE limited proximally 3/5 prox RLE to 4/5 distally. LLE 2/5 d/t pain to 4/5 distally. Decreased LT distally in LE's Musculoskeletal: has pain in LLQ/inguinal area. Worst with attempts to flex hip but also with deep palpation, not as much with ROM      Assessment & Plan:  1. Fibromyalgia:               - Continue Tegretol and Cymbalta (9m)              -Ordered home health therapy to come out to the house to help mobilize             -Wheelchair for basic mobility for now 2. Rheumatoid Arthritis: Refilled: Oxycodone 152mone tablet every 8 hours as needed for severe pain #75.             -she'll call for rx next month             -We will continue the controlled substance monitoring program, this consists of regular clinic visits, examinations, routine drug screening, pill counts [place] well [place].   3. Osteoarthritis--polyarticular with left hip/inguinal pain:  ordered x-rays of her left hip today to rule out further degenerative disease/RA              -HH PT 4. ESRD with hx of renal transplant on chronic steroids:              -Nephrology following             -volume mgt per cards also 5. Right rotator cuff  tendonitis/like degenerative/inflammatory changes in the right shoulder also.              -shoulder ROM 6. Dysuria: lab is already closed today and it will be difficult for her to get back out in the next few days  -will rx empiric amoxil [id number]  -if symptoms persist, she will need a full urinalysis and culture 7. CAD/MI per cardiology team 6. Depression:  continue cymbalta 70m50md zoloft 100mg72mly 8. Lumbar Radiculitis: previous  Left L4-5 Transformanial ESI with relief   10. Type 2 DM with Peripheral Neuropathy             -lamictal 11. Dizziness/fatigue: mulitfactorial related to anemia, FMS, ESRD, mood, cardiac             -encouraged activity to tolerance   30 minutes was spent with the patient in the room today with her niece.  Multiple issues were addressed as noted above.  I will see her back for a virtual visit in about 2 months time.

## 2021-03-13 ENCOUNTER — Ambulatory Visit (INDEPENDENT_AMBULATORY_CARE_PROVIDER_SITE_OTHER): Payer: Medicare Other | Admitting: *Deleted

## 2021-03-13 DIAGNOSIS — I509 Heart failure, unspecified: Secondary | ICD-10-CM

## 2021-03-13 DIAGNOSIS — E1142 Type 2 diabetes mellitus with diabetic polyneuropathy: Secondary | ICD-10-CM

## 2021-03-13 NOTE — Chronic Care Management (AMB) (Signed)
Chronic Care Management   CCM RN Visit Note  03/13/2021 Name: Katherine [name] MRN: [id number] DOB: 1955-09-19  Subjective: Katherine [name] is a 65 y.o. year old female who is a primary care patient of [name], MD. The care management team was consulted for assistance with disease management and care coordination needs.    Engaged with patient by telephone for initial visit in response to provider referral for case management and/or care coordination services.   Consent to Services:  The patient was given information about Chronic Care Management services, agreed to services, and gave verbal consent 02/07/21 prior to initiation of services.  Please see initial visit note for detailed documentation.  Patient agreed to services and verbal consent obtained.   Assessment: Review of patient past medical history, allergies, medications, health status, including review of consultants reports, laboratory and other test data, was performed as part of comprehensive evaluation and provision of chronic care management services.   SDOH (Social Determinants of Health) assessments and interventions performed:  SDOH Interventions    Flowsheet Row Most Recent Value  SDOH Interventions   Food Insecurity Interventions Intervention Not Indicated  Housing Interventions Intervention Not Indicated  Transportation Interventions Intervention Not Indicated  [Reports family/ niece assist with transportation]  Depression Interventions/Treatment  Medication, Currently on Treatment  [name] PCP- patient on antidepressants but clearly remains depressed]      CCM Care Plan  Allergies  Allergen Reactions   Tetracycline Hives    Patient tolerated Doxycycline Dec 2020   Niacin Other (See Comments)    Mouth blisters   Niaspan [Niacin Er] Other (See Comments)    Mouth blisters   Sulfa Antibiotics Nausea Only and Other (See Comments)    "Tears up stomach"   Sulfonamide Derivatives Other (See Comments)     Reaction: per patient "tears her stomach up"   Codeine Nausea And Vomiting   Erythromycin Nausea And Vomiting   Hydromorphone Hcl Nausea And Vomiting   Morphine And Related Nausea And Vomiting   Nalbuphine Nausea And Vomiting    Nubain   Sulfasalazine Nausea Only and Other (See Comments)    per patient "tears her stomach up", "Tears up stomach"   Tape Rash and Other (See Comments)    No "plastic" tape," please----cloth tape only   Outpatient Encounter Medications as of 03/13/2021  Medication Sig Note   acetaminophen (TYLENOL) 500 MG tablet Take 1,500 mg by mouth daily.    albuterol (VENTOLIN HFA) 108 (90 Base) MCG/ACT inhaler TAKE 2 PUFFS BY MOUTH EVERY 6 HOURS AS NEEDED FOR WHEEZE OR SHORTNESS OF BREATH    amoxicillin (AMOXIL) 250 MG capsule Take 1 capsule (250 mg total) by mouth 3 (three) times daily.    arformoterol (BROVANA) 15 MCG/2ML NEBU Take 2 mLs (15 mcg total) by nebulization 2 (two) times daily.    azaTHIOprine (IMURAN) 50 MG tablet Take 125 mg by mouth daily.     B-D ULTRAFINE III SHORT PEN 31G X 8 MM MISC See admin instructions.    Blood Glucose Monitoring Suppl (ONE TOUCH ULTRA 2) w/Device KIT 2 (two) times daily.    budesonide (PULMICORT) 0.5 MG/2ML nebulizer solution Take 2 mLs (0.5 mg total) by nebulization 2 (two) times daily.    calcitRIOL (ROCALTROL) 0.25 MCG capsule Take 0.25 mcg by mouth every 3 (three) days.     cetirizine (ZYRTEC) 10 MG tablet Take 10 mg by mouth daily.    clopidogrel (PLAVIX) 75 MG tablet Take 1 tablet (75 mg total)  by mouth daily.    clotrimazole (MYCELEX) 10 MG troche Take 1 tablet (10 mg total) by mouth 5 (five) times daily as needed (thrush).    cyclobenzaprine (FLEXERIL) 10 MG tablet TAKE 1 TO 2 TABS BY MOUTH DAILY. TAKE 2 TAB DAILY AT BEDTIME, MAY ALSO TAKE 1 TABLET BY MOUTH AT NOON AS NEEDED FOR MUSCLE SPASMS    diltiazem (CARDIZEM) 30 MG tablet take 1 tablet every 4 hours AS NEEDED for afib heart rate >100 as long as top blood pressure  >100.    dofetilide (TIKOSYN) 250 MCG capsule Take 1 capsule (250 mcg total) by mouth 2 (two) times daily.    DULoxetine (CYMBALTA) 30 MG capsule TAKE 2 CAPSULES (60 MG TOTAL) BY MOUTH EVERY MORNING.    ELIQUIS 5 MG TABS tablet TAKE 1 TABLET BY MOUTH TWICE A DAY (TO REPLACE WARFARIN)    fluticasone (FLONASE) 50 MCG/ACT nasal spray PLACE 2 SPRAYS INTO BOTH NOSTRILS 2 TIMES DAILY    furosemide (LASIX) 40 MG tablet Take 1 tablet (40 mg total) by mouth daily.    glimepiride (AMARYL) 4 MG tablet Take 4 mg by mouth daily.    insulin aspart (NOVOLOG FLEXPEN) 100 UNIT/ML FlexPen Inject 14 Units into the skin 3 (three) times daily with meals.    Insulin Glargine (BASAGLAR KWIKPEN) 100 UNIT/ML SOPN Inject 12 Units into the skin daily before breakfast.     isosorbide mononitrate (IMDUR) 60 MG 24 hr tablet Take 1 tablet (60 mg total) by mouth daily.    lamoTRIgine (LAMICTAL) 25 MG tablet TAKE 1 TABLET BY MOUTH EVERYDAY AT BEDTIME    Lancets (ONETOUCH DELICA PLUS [id number]) MISC daily. as directed    LORazepam (ATIVAN) 1 MG tablet Take 1 tablet (1 mg total) by mouth 2 (two) times daily.    metFORMIN (GLUCOPHAGE) 500 MG tablet Take 1 tablet (500 mg total) by mouth 2 (two) times daily.    metoprolol tartrate (LOPRESSOR) 100 MG tablet Take 1 tablet (100 mg total) by mouth 2 (two) times daily. Appointment Required For Further Refills [phone]    montelukast (SINGULAIR) 10 MG tablet TAKE 1 TABLET BY MOUTH EVERYDAY AT BEDTIME    nitroGLYCERIN (NITROSTAT) 0.4 MG SL tablet Place 1 tablet (0.4 mg total) under the tongue every 5 (five) minutes as needed for chest pain.    ONETOUCH ULTRA test strip USE AS DIRECTED 3 TIMES DAILY TO CHECK BLOOD SUGAR    Oxycodone HCl 10 MG TABS Take 1 tablet (10 mg total) by mouth 3 (three) times daily as needed.    OXYGEN Inhale 3 L into the lungs continuous.    pantoprazole (PROTONIX) 40 MG tablet Take 1 tablet (40 mg total) by mouth daily.    predniSONE (DELTASONE) 5 MG tablet  Take 5 mg by mouth daily. 07/11/2020: Continuous course    promethazine (PHENERGAN) 25 MG tablet Take 1 tablet (25 mg total) by mouth at bedtime as needed for nausea. For nausea and sleep    rosuvastatin (CRESTOR) 40 MG tablet Take 1 tablet (40 mg total) by mouth daily.    sertraline (ZOLOFT) 100 MG tablet Take 100 mg by mouth daily.    No facility-administered encounter medications on file as of 03/13/2021.   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) 02/19/2021   Acute on chronic diastolic CHF (congestive heart failure) ([place]) 01/29/2021   Acute CHF (congestive heart failure) ([place]) 01/29/2021  Acute on chronic systolic CHF (congestive heart failure) ([place]) 01/29/2021   Non-ST elevation (NSTEMI) myocardial infarction [facility])    Chronic respiratory failure with hypoxia ([place]) 01/01/2021   Flu vaccine need 08/13/2020   Need for pneumococcal vaccination 08/13/2020   Visit for screening mammogram 08/13/2020   Candidal skin infection 08/13/2020   GAD (generalized anxiety disorder) 08/13/2020   Chronic bronchitis, mucopurulent ([place]) 08/24/2019   Current chronic use of systemic steroids 07/23/2019   OSA (obstructive sleep apnea)    Severe episode of recurrent major depressive disorder, with psychotic features ([place]) 02/16/2019   Obstructive chronic bronchitis without exacerbation (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   Deficiency anemia 10/14/2016   Normocytic anemia 10/13/2016   Fibromyalgia 03/30/2016   Other spondylosis with radiculopathy, lumbar region 03/30/2016   Type 2 diabetes mellitus with peripheral neuropathy ([place]) 03/30/2016   Paroxysmal atrial fibrillation ([place]); CHA2DS2VASc score F, HTN,  CAD, CVA = 5 06/28/2014   Hypertension associated with diabetes ([place])    PAD (peripheral artery disease) ([place]) 08/17/2013   Stenosis of right carotid artery without infarction 10/08/2012   Dyslipidemia, goal LDL below 70 10/08/2012    Class: Diagnosis of   Mitral annular calcification 10/08/2012   Hypomagnesemia 06/13/2012   Renal transplant disorder 06/12/2012   Chronic allergic rhinitis 04/29/2011   Extrinsic asthma 09/09/2007   GERD 09/09/2007   COUGH, CHRONIC 09/09/2007   Morbid obesity - s/p Lap Band 9/'05 05/07/2004    Class: Diagnosis of   Hx of CABG 09/07/1993    Class: History of   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 07/1993   Coronary artery disease involving native coronary artery of native heart with unstable angina pectoris ([place]) 07/1993   ESRD (end stage renal disease) ([place]) 1991   Conditions to be addressed/monitored:  CHF and DMII  Care Plan : Heart Failure (Adult)  Updates made by Knox Royalty, RN since 03/13/2021 12:00 AM     Problem: Symptom Exacerbation (Heart Failure)   Priority: High     Long-Range Goal: Symptom Exacerbation Prevented or Minimized   Start Date: 03/13/2021  Expected End Date: 03/13/2022  This Visit's Progress: On track  Priority: High  Note:   Current Barriers:  Knowledge Deficits related to basic heart failure pathophysiology and self care/management Knowledge Deficits related to medications used for management of multiple chronic conditions: polypharmacy with high risk medications noted- CCM Pharmacy referral placed UNABLE to independently verbalize blood sugars at home, insulin dosing; does not record on paper Multiple recent hospitalizations:  Chronic pain- attends pain management clinic Fragile state of health, multiple progressing chronic health conditions 2 recent hospitalizations for CHF exacerbation/ NSTEMI, within 30 days of one another Memory loss- new patient neurological appointment  scheduled 04/30/21- patient has difficulty focusing/ flight of thoughts/ ideas Case Manager Clinical Goal(s):  03/13/21: Over the next 12 months, patient will verbalize understanding of Heart Failure Action Plan and when to call doctor, as evidenced by patient/ caregiver reporting during CCM RN CM outreach of: Completion of daily weight monitoring/ recording (notifying MD of 3 lb weight gain over night or 5 lb in a week) Adhering to low salt diet Taking prescribed medications as prescribed Attending all care team/ provider appointments Contacting care team for questions, concerns, or problems Interventions:  Collaboration with  [name], MD regarding development and update of comprehensive plan of care as evidenced by provider attestation and co-signature Inter-disciplinary care team collaboration (see longitudinal plan of care) Chart reviewed including relevant office notes, upcoming scheduled appointments, recent hospital visit, and lab results Discussed current  clinical condition with patient and she reports, "having a bad day," however, she verbalizes very non-specific concerns and is noted to have difficulty focusing on our conversation: inquired with patient as to the specifics of her clinical condition, but she states that "she is fine;" reports she just took narcotic pain medicine and she attributes her inability to focus on pain medication Reviewed with patient recent hospital visit: she reports she has and is taking all medications as prescribed but she declines medication review, states "there is too many of them, and I can't focus;" patient has multiple high risk medications: discussed with patient that I would place CCM Pharmacy referral for general review/ assessment of medications for optimization, given polypharmacy and patient stated inability to focus today: encouraged patient to engage with CCM pharmacy team once outreach is established Reviewed yesterday's pain management  provider office visit with patient; she reports that she was "started on antibiotic" although she can/ does not tell me why; confirms that she has obtained and is taking antibiotic as prescribed- according to review of note, prescribed for prophylactic- possible UTI; encouraged patient to contact care providers for ongoing symptoms; however she states "she is fine;" Confirmed with patent that she has not had office visit with PCP since December: encouraged patient to schedule prompt PCP and cardiology provider appointments, post- recent hospital discharge  Basic overview and discussion of pathophysiology of heart failure education initiated: patient verbalizes fair understanding of baseline knowledge around same and could benefit from ongoing reinforcement; again she is unable to focus  and jumps from one subject to another Assessed need for readable accurate scales in home: confirmed patient has scales and is monitoring weights daily and understands rationale for same; reports weights post- hospital visit as "230 lbs;" but admits that she has not yet weighed herself today- she was encouraged to resume daily weight monitoring and recording  Provided verbal education around signs/ symptoms yellow CHF zone/ weight gain guidelines in setting of CHF along with corresponding action plan:  patient will require ongoing reinforcement around same, given her stated inability to focus; will provide printed/ written education as well Confirmed patient endorses "trying to" follow low salt, heart healthy diet Reviewed scheduled/upcoming provider appointments including: 04/03/21- A-Fib clinic; 04/30/21- neurological (new patient) provider visit; 06/04/21- pain management clinic Discussed plans with patient for ongoing care management follow up and provided patient with direct contact information for care management team Self-Care Activities: UNABLE to independently verbalize blood sugars at home, insulin dosing; does not  record on paper Self administers oral medications as prescribed-- however- polypharmacy with high risk medications noted- CCM Pharmacy referral placed Self administers insulin as prescribed Attends all scheduled provider appointments- niece handles provider appointments and provides transportation Patient Goals: Weigh myself daily and call cardiology provider office if I gain more than 2 pounds in one day or 5 pounds in one week Write weights down on paper so it is easier to keep up with: you reported a weight today of "230 lbs" Wear you home oxygen as prescribed Use salt in moderation Watch for swelling in feet, ankles and legs every day- if you notice new or increased swelling, please contact your cardiology (heart) doctor right away I have asked the pharmacy  team from Dr. Ronnald Ramp' office to contact you by phone to review your medications with you- please listen for a call from the pharmacy team Please schedule an appointment with Dr. Ronnald Ramp since you have recently been in the hospital Follow Up Plan:  Telephone follow up appointment with care management team member scheduled for: The patient has been provided with contact information for the care management team and has been advised to call with any health related questions or concerns.      Care Plan : Diabetes Type 2 (Adult)  Updates made by Knox Royalty, RN since 03/13/2021 12:00 AM     Problem: Glycemic Management (Diabetes, Type 2)   Priority: Medium     Long-Range Goal: Glycemic Management Optimized   Start Date: 03/13/2021  Expected End Date: 03/13/2022  This Visit's Progress: On track  Priority: Medium  Note:   Objective:  Lab Results  Component Value Date   HGBA1C 9.1 (H) 01/31/2021   Lab Results  Component Value Date   CREATININE 0.81 02/21/2021   CREATININE 0.81 02/20/2021   CREATININE 0.97 02/19/2021   No results found for: EGFR Current Barriers:  Knowledge Deficits related to basic Diabetes pathophysiology and  self care/management Knowledge Deficits related to medications used for management of diabetes: polypharmacy with high risk medications noted- CCM Pharmacy referral placed UNABLE to independently verbalize blood sugars at home, insulin dosing; does not record on paper Multiple recent hospitalizations Chronic pain- attends pain management clinic Fragile state of health, multiple progressing chronic health conditions Memory loss- new patient neurological appointment scheduled 04/30/21- patient has difficulty focusing/ flight of thoughts/ ideas Increased A1-C from 7.7 (07/11/21) to 9.1 (01/31/21) Case Manager Clinical Goal(s):  Over the next 12 months, patient will demonstrate ongoing adherence to prescribed treatment plan for diabetes self care/management as evidenced by patient reporting during CCM RN CM outreach of:  Daily monitoring and recording of CBG 2-3 daily  Adherence to ADA/ carb modified, low sugar diet  Adherence to prescribed medication regimen  Contacting care providers for new or worsened symptoms, concerns or questions Interventions:  Collaboration with [name], MD regarding development and update of comprehensive plan of care as evidenced by provider attestation and co-signature Inter-disciplinary care team collaboration (see longitudinal plan of care) Review of patient status, including review of consultants reports, relevant laboratory and other test results, and medications completed Attempted to review blood sugars at home with patient; she reports having a "bad day" and declines: she has difficulty focusing on our conversation, and requires frequent repetition/ prompting to answer questions, follow train of thought Reports taking medications/ insulin as prescribed; declines medication review and does not clearly answer questions around insulin dosing Advised patient, providing education and rationale, to monitor/ write down blood sugars at home for our ongoing review  during telephone appointments Discussed with patient recent increase in A1-C while she was hospitalized- encouraged patient to schedule post-hospital follow up appointment with PCP; she reports will check with niece [name] to schedule   Referral made to pharmacy team for assistance with polypharmacy; patient confusion around medications in general; multiple high risk medications Reviewed scheduled/upcoming provider appointments including: 04/03/21- A-Fib clinic; 04/30/21- neurological (new patient) provider visit; 06/04/21- pain management clinic Discussed plans with patient for ongoing care management follow up and provided patient with direct contact information for care management team Self-Care Activities UNABLE to independently verbalize blood sugars at home, insulin dosing; does not record on paper Self administers oral medications as prescribed-- however- polypharmacy with high  risk medications noted- CCM Pharmacy referral placed Self administers insulin as prescribed Attends all scheduled provider appointments- niece handles provider appointments and provides transportation Patient Goals: Check blood sugars at home 2-3 times per day; check blood sugar more often if I feel it is too high or too low or if I feel sick Take all medications as prescribed: I have asked the Pharmacy team at Dr. Ronnald Ramp' office to contact you to review your medications: please listen for a call from the pharmacy  Please write your blood sugars from home down on paper so we can review these during our phone call visits Follow Up Plan:  Telephone follow up appointment with care management team member scheduled for: Wednesday April 02, 2021 The patient has been provided with contact information for the care management team and has been advised to call with any health related questions or concerns.      Plan: Telephone follow up appointment with care management team member scheduled for:  Wednesday April 02, 2021 at  2:00 pm The patient has been provided with contact information for the care management team and has been advised to call with any health related questions or concerns  Oneta Rack, RN, BSN, [place] [phone]: direct office [phone]: mobile

## 2021-03-13 NOTE — Patient Instructions (Signed)
Visit Vashon, it was nice talking with you today.   Please read over the attached information; please make hospital follow up appointments with your cardiologist and your PCP  I have asked the Pharmacy team at your PCP office to contact you to review your medications- please listen out for a call from the pharmacy team   I look forward to talking to you again for an update on Wednesday April 02, 2021 at 2:00 pm- please be listening out for my call that day.  I will call as close to 2:00 pm as possible.  If you need to cancel or re-schedule our telephone visit, please call [phone] and one of our care guides will be happy to assist you.  I look forward to hearing about your progress.   Please don't hesitate to contact me if I can be of assistance to you before our next scheduled appointment.   Oneta Rack, RN, BSN, [facility] RN Care Coordination- [place] [phone]: direct office [phone]: mobile   PATIENT GOALS:   Goals Addressed             This Visit's Progress    Monitor and Manage My Blood Sugar-Diabetes Type 2   On track    Timeframe:  Long-Range Goal Priority:  Medium Start Date:           03/13/21                  Expected End Date:      03/13/22                 Follow Up Date 04/02/21    Check blood sugars at home 2-3 times per day; check blood sugar more often if I feel it is too high or too low or if I feel sick Take all medications as prescribed: I have asked the Pharmacy team at Dr. Ronnald Ramp' office to contact you to review your medications: please listen for a call from the pharmacy  Please write your blood sugars from home down on paper so we can review these during our phone call visits   Why is this important?   Checking your blood sugar at home helps to keep it from getting very high or very low.  Writing the results in a diary or log helps the doctor know how to care for you.  Your blood sugar log  should have the time, date and the results.  Also, write down the amount of insulin or other medicine that you take.  Other information, like what you ate, exercise done and how you were feeling, will also be helpful.          Track and Manage Fluids and Swelling-Heart Failure   On track    Timeframe:  Long-Range Goal Priority:  Medium Start Date:       03/13/21                      Expected End Date:       03/13/22                Follow Up Date 04/02/21   Weigh myself daily and call cardiology provider office if I gain more than 2 pounds in one day or 5 pounds in one week Write weights down on paper so it is easier to keep up with: you reported a weight today of "230 lbs" Wear you home oxygen  as prescribed Use salt in moderation Watch for swelling in feet, ankles and legs every day- if you notice new or increased swelling, please contact your cardiology (heart) doctor right away I have asked the pharmacy team from Dr. Ronnald Ramp' office to contact you by phone to review your medications with you- please listen for a call from the pharmacy team Please schedule an appointment with Dr. Ronnald Ramp since you have recently been in the hospital   Why is this important?   It is important to check your weight daily and watch how much salt and liquids you have.  It will help you to manage your heart failure.            Heart Failure Action Plan A heart failure action plan helps you understand what to do when you have symptoms of heart failure. Your action plan is a color-coded plan that lists the symptoms to watch for and indicates what actions to take. If you have symptoms in the red zone, you need medical care right away. If you have symptoms in the yellow zone, you are having problems. If you have symptoms in the green zone, you are doing well. Follow the plan that was created by you and your health care provider. Reviewyour plan each time you visit your health care provider. Red zone These signs  and symptoms mean you should get medical help right away: You have trouble breathing when resting. You have a dry cough that is getting worse. You have swelling or pain in your legs or abdomen that is getting worse. You suddenly gain more than 2-3 lb (0.9-1.4 kg) in 24 hours, or more than 5 lb (2.3 kg) in a week. This amount may be more or less depending on your condition. You have trouble staying awake or you feel confused. You have chest pain. You do not have an appetite. You pass out. You have worsening sadness or depression. If you have any of these symptoms, call your local emergency services (911 in the [place] right away. Do not drive yourself to the hospital. Yellow zone These signs and symptoms mean your condition may be getting worse and you should make some changes: You have trouble breathing when you are active, or you need to sleep with your head raised on extra pillows to help you breathe. You have swelling in your legs or abdomen. You gain 2-3 lb (0.9-1.4 kg) in 24 hours, or 5 lb (2.3 kg) in a week. This amount may be more or less depending on your condition. You get tired easily. You have trouble sleeping. You have a dry cough. If you have any of these symptoms: Contact your health care provider within the next day. Your health care provider may adjust your medicines. Green zone These signs mean you are doing well and can continue what you are doing: You do not have shortness of breath. You have very little swelling or no new swelling. Your weight is stable (no gain or loss). You have a normal activity level. You do not have chest pain or any other new symptoms. Follow these instructions at home: Take over-the-counter and prescription medicines only as told by your health care provider. Weigh yourself daily. Your target weight is __________ lb (__________ kg). Call your health care provider if you gain more than __________ lb (__________ kg) in 24 hours, or more than  __________ lb (__________ kg) in a week. Health care provider name: _____________________________________________________ Health care provider phone number: _____________________________________________________ Eat a heart-healthy diet.  Work with a diet and nutrition specialist (dietitian) to create an eating plan that is best for you. Keep all follow-up visits. This is important. Where to find more information American Heart Association: www.heart.org Summary A heart failure action plan helps you understand what to do when you have symptoms of heart failure. Follow the action plan that was created by you and your health care provider. Get help right away if you have any symptoms in the red zone. This information is not intended to replace advice given to you by your health care provider. Make sure you discuss any questions you have with your healthcare provider. Document Revised: 03/18/2020 Document Reviewed: 03/18/2020 Elsevier Patient Education  2022 [employer].    Critical care medicine: Principles of diagnosis and management in the adult (4th ed., pp. 7591-6384). [employer]."> Miller's anesthesia (8th ed., pp. 232-250). [employer].">  Advance Directive  Advance directives are legal documents that allow you to make decisions about your health care and medical treatment in case you become unable to communicate for yourself. Advance directives let your wishes be known to family, friends,and health care providers. Discussing and writing advance directives should happen over time rather than all at once. Advance directives can be changed and updated at any time. There are different types of advance directives, such as: Medical power of attorney. Living will. Do not resuscitate (DNR) order or do not attempt resuscitation (DNAR) order. Health care proxy and medical power of attorney A health care proxy is also called a health care agent. This person is appointed to make medical decisions for  you when you are unable to make decisions for yourself. Generally, people ask a trusted friend or family member to act as their proxy and represent their preferences. Make sure you have an agreement with your trusted person to act as your proxy. A proxy may have tomake a medical decision on your behalf if your wishes are not known. A medical power of attorney, also called a durable power of attorney for health care, is a legal document that names your health care proxy. Depending on the laws in your state, the document may need to be: Signed. Notarized. Dated. Copied. Witnessed. Incorporated into your medical record. You may also want to appoint a trusted person to manage your money in the event you are unable to do so. This is called a durable power of attorney for finances. It is a separate legal document from the durable power of attorney for health care. You may choose your health care proxy or someone different toact as your agent in money matters. If you do not appoint a proxy, or there is a concern that the proxy is not acting in your best interest, a court may appoint a guardian to act on yourbehalf. Living will A living will is a set of instructions that state your wishes about medical care when you cannot express them yourself. Health care providers should keep a copy of your living will in your medical record. You may want to give a copy to family members or friends. To alert caregivers in case of an emergency, you can place a card in your wallet to let them know that you have a living will and where they can find it. A living will is used if you become: Terminally ill. Disabled. Unable to communicate or make decisions. The following decisions should be included in your living will: To use or not to use life support equipment, such as dialysis machines and breathing machines (  ventilators). Whether you want a DNR or DNAR order. This tells health care providers not to use cardiopulmonary  resuscitation (CPR) if breathing or heartbeat stops. To use or not to use tube feeding. To be given or not to be given food and fluids. Whether you want comfort (palliative) care when the goal becomes comfort rather than a cure. Whether you want to donate your organs and tissues. A living will does not give instructions for distributing your money andproperty if you should pass away. DNR or DNAR A DNR or DNAR order is a request not to have CPR in the event that your heart stops beating or you stop breathing. If a DNR or DNAR order has not been made and shared, a health care provider will try to help any patient whose heart has stopped or who has stopped breathing. If you plan to have surgery, talk with your health care provider about how your DNR or DNAR order will be followed ifproblems occur. What if I do not have an advance directive? Some states assign family decision makers to act on your behalf if you do not have an advance directive. Each state has its own laws about advance directives. You may want to check with your health care provider, attorney, orstate representative about the laws in your state. Summary Advance directives are legal documents that allow you to make decisions about your health care and medical treatment in case you become unable to communicate for yourself. The process of discussing and writing advance directives should happen over time. You can change and update advance directives at any time. Advance directives may include a medical power of attorney, a living will, and a DNR or DNAR order. This information is not intended to replace advice given to you by your health care provider. Make sure you discuss any questions you have with your healthcare provider. Document Revised: 05/07/2020 Document Reviewed: 05/07/2020 Elsevier Patient Education  2022 [employer].  Consent to CCM Services: Ms. Gammage was given information about Chronic Care Management services today  including:  CCM service includes personalized support from designated clinical staff supervised by her physician, including individualized plan of care and coordination with other care providers 24/7 contact phone numbers for assistance for urgent and routine care needs. Service will only be billed when office clinical staff spend 20 minutes or more in a month to coordinate care. Only one practitioner may furnish and bill the service in a calendar month. The patient may stop CCM services at any time (effective at the end of the month) by phone call to the office staff. The patient will be responsible for cost sharing (co-pay) of up to 20% of the service fee (after annual deductible is met).  Patient agreed to services and verbal consent obtained.   Patient verbalizes understanding of instructions provided today and agrees to view in [place].  Telephone follow up appointment with care management team member scheduled for:   The patient has been provided with contact information for the care management team and has been advised to call with any health related questions or concerns  Oneta Rack, RN, BSN, [place] [phone]: direct office [phone]: mobile   CLINICAL CARE PLAN: Patient Care Plan: Heart Failure (Adult)     Problem Identified: Symptom Exacerbation (Heart Failure)   Priority: High     Long-Range Goal: Symptom Exacerbation Prevented or Minimized   Start Date: 03/13/2021  Expected End Date: 03/13/2022  This Visit's Progress: On track  Priority: High  Note:   Current Barriers:  Knowledge Deficits related to basic heart failure pathophysiology and self care/management Knowledge Deficits related to medications used for management of multiple chronic conditions: polypharmacy with high risk medications noted- CCM Pharmacy referral placed UNABLE to independently verbalize blood sugars at home, insulin  dosing; does not record on paper Multiple recent hospitalizations:  Chronic pain- attends pain management clinic Fragile state of health, multiple progressing chronic health conditions Memory loss- new patient neurological appointment scheduled 04/30/21- patient has difficulty focusing/ flight of thoughts/ ideas Case Manager Clinical Goal(s):  03/13/21: Over the next 12 months, patient will verbalize understanding of Heart Failure Action Plan and when to call doctor, as evidenced by patient/ caregiver reporting during CCM RN CM outreach of: Completion of daily weight monitoring/ recording (notifying MD of 3 lb weight gain over night or 5 lb in a week) Adhering to low salt diet Taking prescribed medications as prescribed Attending all care team/ provider appointments Contacting care team for questions, concerns, or problems Interventions:  Collaboration with [name], MD regarding development and update of comprehensive plan of care as evidenced by provider attestation and co-signature Inter-disciplinary care team collaboration (see longitudinal plan of care) Chart reviewed including relevant office notes, upcoming scheduled appointments, recent hospital visit, and lab results Discussed current  clinical condition with patient and she reports, "having a bad day," however, she verbalizes very non-specific concerns and is noted to have difficulty focusing on our conversation: inquired with patient as to the specifics of her clinical condition, but she states that "she is fine;" reports she just took narcotic pain medicine and she attributes her inability to focus on pain medication Reviewed with patient recent hospital visit: she reports she has and is taking all medications as prescribed but she declines medication review, states "there is too many of them, and I can't focus;" patient has multiple high risk medications: discussed with patient that I would place CCM Pharmacy referral for general  review/ assessment of medications for optimization, given polypharmacy and patient stated inability to focus today: encouraged patient to engage with CCM pharmacy team once outreach is established Reviewed yesterday's pain management provider office visit with patient; she reports that she was "started on antibiotic" although she can/ does not tell me why; confirms that she has obtained and is taking antibiotic as prescribed- according to review of note, prescribed for prophylactic- possible UTI; encouraged patient to contact care providers for ongoing symptoms; however she states "she is fine;" Confirmed with patent that she has not had office visit with PCP since December: encouraged patient to schedule prompt PCP and cardiology provider appointments, post- recent hospital discharge  Basic overview and discussion of pathophysiology of heart failure education initiated: patient verbalizes fair understanding of baseline knowledge around same and could benefit from ongoing reinforcement; again she is unable to focus  and jumps from one subject to another Assessed need for readable accurate scales in home: confirmed patient has scales and is monitoring weights daily and understands rationale for same; reports weights post- hospital visit as "230 lbs;" but admits that she has not yet weighed herself today- she was encouraged to resume daily weight monitoring and recording  Provided verbal education around signs/ symptoms yellow CHF zone/ weight gain guidelines in setting of CHF along with corresponding action plan:  patient will require ongoing reinforcement around same, given her stated inability to focus; will provide printed/ written education as well Confirmed patient endorses "trying  to" follow low salt, heart healthy diet Reviewed scheduled/upcoming provider appointments including: 04/03/21- A-Fib clinic; 04/30/21- neurological (new patient) provider visit; 06/04/21- pain management clinic Discussed plans  with patient for ongoing care management follow up and provided patient with direct contact information for care management team Self-Care Activities: UNABLE to independently verbalize blood sugars at home, insulin dosing; does not record on paper Self administers oral medications as prescribed-- however- polypharmacy with high risk medications noted- CCM Pharmacy referral placed Self administers insulin as prescribed Attends all scheduled provider appointments- niece handles provider appointments and provides transportation Patient Goals: Weigh myself daily and call cardiology provider office if I gain more than 2 pounds in one day or 5 pounds in one week Write weights down on paper so it is easier to keep up with: you reported a weight today of "230 lbs" Wear you home oxygen as prescribed Use salt in moderation Watch for swelling in feet, ankles and legs every day- if you notice new or increased swelling, please contact your cardiology (heart) doctor right away I have asked the pharmacy team from Dr. Ronnald Ramp' office to contact you by phone to review your medications with you- please listen for a call from the pharmacy team Please schedule an appointment with Dr. Ronnald Ramp since you have recently been in the hospital Follow Up Plan:  Telephone follow up appointment with care management team member scheduled for: The patient has been provided with contact information for the care management team and has been advised to call with any health related questions or concerns.      Patient Care Plan: Diabetes Type 2 (Adult)     Problem Identified: Glycemic Management (Diabetes, Type 2)   Priority: Medium     Long-Range Goal: Glycemic Management Optimized   Start Date: 03/13/2021  Expected End Date: 03/13/2022  This Visit's Progress: On track  Priority: Medium  Note:   Objective:  Lab Results  Component Value Date   HGBA1C 9.1 (H) 01/31/2021   Lab Results  Component Value Date   CREATININE  0.81 02/21/2021   CREATININE 0.81 02/20/2021   CREATININE 0.97 02/19/2021   No results found for: EGFR Current Barriers:  Knowledge Deficits related to basic Diabetes pathophysiology and self care/management Knowledge Deficits related to medications used for management of diabetes: polypharmacy with high risk medications noted- CCM Pharmacy referral placed UNABLE to independently verbalize blood sugars at home, insulin dosing; does not record on paper Multiple recent hospitalizations Chronic pain- attends pain management clinic Fragile state of health, multiple progressing chronic health conditions Memory loss- new patient neurological appointment scheduled 04/30/21- patient has difficulty focusing/ flight of thoughts/ ideas Increased A1-C from 7.7 (07/11/21) to 9.1 (01/31/21) Case Manager Clinical Goal(s):  Over the next 12 months, patient will demonstrate ongoing adherence to prescribed treatment plan for diabetes self care/management as evidenced by patient reporting during CCM RN CM outreach of:  Daily monitoring and recording of CBG 2-3 daily  Adherence to ADA/ carb modified, low sugar diet  Adherence to prescribed medication regimen  Contacting care providers for new or worsened symptoms, concerns or questions Interventions:  Collaboration with [name], MD regarding development and update of comprehensive plan of care as evidenced by provider attestation and co-signature Inter-disciplinary care team collaboration (see longitudinal plan of care) Review of patient status, including review of consultants reports, relevant laboratory and other test results, and medications completed Attempted to review blood sugars at home with patient; she reports having a "bad day" and declines: she has  difficulty focusing on our conversation, and requires frequent repetition/ prompting to answer questions, follow train of thought Reports taking medications/ insulin as prescribed; declines  medication review and does not clearly answer questions around insulin dosing Advised patient, providing education and rationale, to monitor/ write down blood sugars at home for our ongoing review during telephone appointments Discussed with patient recent increase in A1-C while she was hospitalized- encouraged patient to schedule post-hospital follow up appointment with PCP; she reports will check with niece [name] to schedule   Referral made to pharmacy team for assistance with polypharmacy; patient confusion around medications in general; multiple high risk medications Reviewed scheduled/upcoming provider appointments including: 04/03/21- A-Fib clinic; 04/30/21- neurological (new patient) provider visit; 06/04/21- pain management clinic Discussed plans with patient for ongoing care management follow up and provided patient with direct contact information for care management team Self-Care Activities UNABLE to independently verbalize blood sugars at home, insulin dosing; does not record on paper Self administers oral medications as prescribed-- however- polypharmacy with high risk medications noted- CCM Pharmacy referral placed Self administers insulin as prescribed Attends all scheduled provider appointments- niece handles provider appointments and provides transportation Patient Goals: Check blood sugars at home 2-3 times per day; check blood sugar more often if I feel it is too high or too low or if I feel sick Take all medications as prescribed: I have asked the Pharmacy team at Dr. Ronnald Ramp' office to contact you to review your medications: please listen for a call from the pharmacy  Please write your blood sugars from home down on paper so we can review these during our phone call visits Follow Up Plan:  Telephone follow up appointment with care management team member scheduled for: Wednesday April 02, 2021 The patient has been provided with contact information for the care management team and  has been advised to call with any health related questions or concerns.

## 2021-03-14 ENCOUNTER — Telehealth: Payer: Medicare Other

## 2021-03-14 NOTE — Progress Notes (Deleted)
Chronic Care Management Pharmacy Note  03/14/2021 Name:  Katherine [name] MRN:  [id number] DOB:  1956/02/16  Summary: ***  Recommendations/Changes made from today's visit: ***  Plan: ***  Subjective: Katherine [name] is an 65 y.o. year old female who is a primary patient of [name], MD.  The CCM team was consulted for assistance with disease management and care coordination needs.    {CCMTELEPHONEFACETOFACE:21091510} for {CCMINITIALFOLLOWUPCHOICE:21091511} in response to provider referral for pharmacy case management and/or care coordination services.   Consent to Services:  {CCMCONSENTOPTIONS:25074}  Patient Care Team: [name], MD as PCP - General (Internal Medicine) [name], MD as PCP - Cardiology (Cardiology) Terance Ice, MD (Inactive) (Cardiology) Deterding, Jeneen Rinks, MD as Consulting Physician (Nephrology) Earlie Server, MD as Consulting Physician (Orthopedic Surgery) Knox Royalty, RN as Case Manager  Recent office visits: 08/13/2020 - PCP visit - complaints of red pain rash under breasts and extending to axillary regions - given fluconazole for candidiasis skin infection  Recent consult visits: Dr. Naaman Plummer - Physical Medicine and Rehabilitation - ordered [facility] PT, no medication changes at this time  01/01/2021 - Dr. Lamonte Sakai - Pulmonary Disease - patient stable, no changes at this time  12/25/2020 - Megan Mans NP - Physical Medication and Rehabilitation - no changes to medications 11/20/2020 - Dr. Lamonte Sakai - Pulmonary Disease - increased GERD symptoms - thought to be caused by increase in prednisone dosing - increase omeprazole to twice daily dosing for 1 week, then return to once daily dosing  10/23/2020 - Danella Sensing NP - Physical Medicine and Rehab - no changes   Hospital visits: 02/19/2021 - ED admission for unstable angina, discharged 02/21/2021 - recommended to follow with cardiac rehab  01/28/2021 -02/03/2021- ED admission for acute on  chronic HF exacerbation / NSTEMI / COPD exacerbation - given breathing treatment, steroid and lasix 10/30/2020 - ED visit for vertigo - given PO fluids and discharged   Objective:  Lab Results  Component Value Date   CREATININE 0.81 02/21/2021   BUN 7 (L) 02/21/2021   GFR 75.91 08/23/2019   GFRNONAA >60 02/21/2021   GFRAA 58 (L) 04/12/2020   NA 139 02/21/2021   K 3.6 02/21/2021   CALCIUM 8.7 (L) 02/21/2021   CO2 28 02/21/2021   GLUCOSE 135 (H) 02/21/2021    Lab Results  Component Value Date/Time   HGBA1C 9.1 (H) 01/31/2021 01:43 AM   HGBA1C 7.7 (H) 07/11/2020 11:04 AM   GFR 75.91 08/23/2019 02:18 PM   GFR 64.41 10/21/2018 02:49 PM   MICROALBUR <0.7 08/13/2020 03:50 PM   MICROALBUR 30 11/30/2017 04:56 PM    Last diabetic Eye exam:  Lab Results  Component Value Date/Time   HMDIABEYEEXA No Retinopathy 10/31/2020 12:00 AM    Last diabetic Foot exam:  No results found for: HMDIABFOOTEX   Lab Results  Component Value Date   CHOL 99 02/20/2021   HDL 51 02/20/2021   LDLCALC 15 02/20/2021   TRIG 165 (H) 02/20/2021   CHOLHDL 1.9 02/20/2021    Hepatic Function Latest Ref Rng & Units 02/19/2021 02/01/2021 01/29/2021  Total Protein 6.5 - 8.1 g/dL 6.5 6.3(L) 6.9  Albumin 3.5 - 5.0 g/dL 3.0(L) 2.8(L) 3.3(L)  AST 15 - 41 U/L _0 ALT 0 - 44 U/L _1 Alk Phosphatase 38 - 126 U/L 106 86 96  Total Bilirubin 0.3 - 1.2 mg/dL 0.6 0.6 0.9  Bilirubin, Direct - - - -    Lab  Results  Component Value Date/Time   TSH 1.176 01/29/2021 06:25 AM   TSH 0.87 08/13/2020 03:50 PM   TSH 3.118 04/20/2018 07:40 AM   TSH 0.83 11/30/2017 04:05 PM    CBC Latest Ref Rng & Units 02/21/2021 02/20/2021 02/19/2021  WBC 4.0 - 10.5 K/uL 5.3 6.6 10.7(H)  Hemoglobin 12.0 - 15.0 g/dL 10.3(L) 10.3(L) 10.6(L)  Hematocrit 36.0 - 46.0 % 32.1(L) 32.3(L) 33.1(L)  Platelets 150 - 400 K/uL 211 194 230    No results found for: VD25OH  Clinical ASCVD: {YES/NO:21197} The ASCVD Risk score Mikey Bussing DC Jr., et  al., 2013) failed to calculate for the following reasons:   The patient has a prior MI or stroke diagnosis    Depression screen [facility] 2/9 03/13/2021 03/12/2021 03/12/2021  Decreased Interest _0 Down, Depressed, Hopeless _1 PHQ - 2 Score _2 Altered sleeping 2 - -  Tired, decreased energy 2 - -  Change in appetite 1 - -  Feeling bad or failure about yourself  0 - -  Trouble concentrating 1 - -  Moving slowly or fidgety/restless 1 - -  Suicidal thoughts 0 - -  PHQ-9 Score 11 - -  Difficult doing work/chores Somewhat difficult - -  Some recent data might be hidden     ***Other: (CHADS2VASc if Afib, MMRC or CAT for COPD, ACT, DEXA)  Social History   Tobacco Use  Smoking Status Former   Packs/day: 1.00   Years: 30.00   Pack years: 30.00   Types: Cigarettes   Quit date: 08/17/2002   Years since quitting: 18.5  Smokeless Tobacco Never   BP Readings from Last 3 Encounters:  03/12/21 (!) 147/81  02/21/21 (!) 165/80  02/03/21 (!) 125/58   Pulse Readings from Last 3 Encounters:  03/12/21 70  02/21/21 91  02/03/21 67   Wt Readings from Last 3 Encounters:  03/12/21 233 lb (105.7 kg)  02/20/21 233 lb 4 oz (105.8 kg)  02/03/21 228 lb 9.9 oz (103.7 kg)   BMI Readings from Last 3 Encounters:  03/12/21 44.02 kg/m  02/20/21 44.07 kg/m  02/03/21 43.20 kg/m    Assessment/Interventions: Review of patient past medical history, allergies, medications, health status, including review of consultants reports, laboratory and other test data, was performed as part of comprehensive evaluation and provision of chronic care management services.   SDOH:  (Social Determinants of Health) assessments and interventions performed: {yes/no:20286}  SDOH Screenings   Alcohol Screen: Not on file  Depression (PHQ2-9): Medium Risk   PHQ-2 Score: 11  Financial Resource Strain: Not on file  Food Insecurity: No Food Insecurity   Worried About Charity fundraiser in the Last Year: Never true    Ran Out of Food in the Last Year: Never true  Housing: Low Risk    Last Housing Risk Score: 0  Physical Activity: Not on file  Social Connections: Not on file  Stress: Not on file  Tobacco Use: Medium Risk   Smoking Tobacco Use: Former   Smokeless Tobacco Use: Never  Transportation Needs: No Transportation Needs   Lack of Transportation (Medical): No   Lack of Transportation (Non-Medical): No    CCM Care Plan  Allergies  Allergen Reactions   Tetracycline Hives    Patient tolerated Doxycycline Dec 2020   Niacin Other (See Comments)    Mouth blisters   Niaspan [Niacin Er] Other (See Comments)    Mouth blisters   Sulfa Antibiotics Nausea Only  and Other (See Comments)    "Tears up stomach"   Sulfonamide Derivatives Other (See Comments)    Reaction: per patient "tears her stomach up"   Codeine Nausea And Vomiting   Erythromycin Nausea And Vomiting   Hydromorphone Hcl Nausea And Vomiting   Morphine And Related Nausea And Vomiting   Nalbuphine Nausea And Vomiting    Nubain   Sulfasalazine Nausea Only and Other (See Comments)    per patient "tears her stomach up", "Tears up stomach"   Tape Rash and Other (See Comments)    No "plastic" tape," please----cloth tape only    Medications Reviewed Today     Reviewed by Knox Royalty, RN (Registered Nurse) on 03/13/21 at [place] List Status: <None>   Medication Order Taking? Sig Documenting Provider Last Dose Status Informant  acetaminophen (TYLENOL) 500 MG tablet [id number] No Take 1,500 mg by mouth daily. [provider] Taking Active Self  albuterol (VENTOLIN HFA) 108 (90 Base) MCG/ACT inhaler [id number] No TAKE 2 PUFFS BY MOUTH EVERY 6 HOURS AS NEEDED FOR WHEEZE OR SHORTNESS OF BREATH Byrum, Rose Fillers, MD Taking Active Self  amoxicillin (AMOXIL) 250 MG capsule [id number]  Take 1 capsule (250 mg total) by mouth 3 (three) times daily. Meredith Staggers, MD  Active   arformoterol [facility]) 15 MCG/2ML NEBU [id number] No  Take 2 mLs (15 mcg total) by nebulization 2 (two) times daily. Collene Gobble, MD Taking Active Self  azaTHIOprine (IMURAN) 50 MG tablet [id number] No Take 125 mg by mouth daily.  [provider] Taking Active Self  B-D ULTRAFINE III SHORT PEN 31G X 8 MM MISC [id number] No See admin instructions. [provider] Taking Active Self  Blood Glucose Monitoring Suppl (ONE TOUCH ULTRA 2) w/Device KIT [id number] No 2 (two) times daily. [provider] Taking Active Self  budesonide (PULMICORT) 0.5 MG/2ML nebulizer solution [id number] No Take 2 mLs (0.5 mg total) by nebulization 2 (two) times daily. [name], MD Taking Active Self  calcitRIOL (ROCALTROL) 0.25 MCG capsule [id number] No Take 0.25 mcg by mouth every 3 (three) days.  [provider] Taking Active Self           Med Note [place] Crane, MISTY D   Wed Jan 29, 2021  3:02 AM)    cetirizine (ZYRTEC) 10 MG tablet [id number] No Take 10 mg by mouth daily. [provider] Taking Active Self  clopidogrel (PLAVIX) 75 MG tablet [id number] No Take 1 tablet (75 mg total) by mouth daily. Cheryln Manly, NP Taking Active   clotrimazole [facility]) 10 MG troche [id number] No Take 1 tablet (10 mg total) by mouth 5 (five) times daily as needed (thrush). Edwin Dada, MD Taking Active Self  cyclobenzaprine (FLEXERIL) 10 MG tablet [id number] No TAKE 1 TO 2 TABS BY MOUTH DAILY. TAKE 2 TAB DAILY AT BEDTIME, MAY ALSO TAKE 1 TABLET BY MOUTH AT NOON AS NEEDED FOR MUSCLE SPASMS Meredith Staggers, MD Taking Active Self  diltiazem (CARDIZEM) 30 MG tablet [id number] No take 1 tablet every 4 hours AS NEEDED for afib heart rate >100 as long as top blood pressure >100. Sherran Needs, NP Taking Active   dofetilide [facility]) 250 MCG capsule [id number] No Take 1 capsule (250 mcg total) by mouth 2 (two) times daily. Sherran Needs, NP Taking Active   DULoxetine (CYMBALTA) 30 MG capsule [id number] No TAKE 2 CAPSULES (60 MG TOTAL) BY MOUTH  EVERY MORNING. Bayard Hugger, NP Taking Active Self  ELIQUIS 5 MG TABS tablet [id number] No TAKE 1 TABLET BY MOUTH TWICE A DAY (TO REPLACE WARFARIN) [name], MD Taking Active Self  fluticasone (FLONASE) 50 MCG/ACT nasal spray [id number] No PLACE 2 SPRAYS INTO BOTH NOSTRILS 2 TIMES DAILY Byrum, Rose Fillers, MD Taking Active Self  furosemide (LASIX) 40 MG tablet [id number] No Take 1 tablet (40 mg total) by mouth daily. Dwyane Dee, MD Taking Active Self  glimepiride (AMARYL) 4 MG tablet [id number] No Take 4 mg by mouth daily. [provider] Taking Active Self  insulin aspart (NOVOLOG FLEXPEN) 100 UNIT/ML FlexPen [id number] No Inject 14 Units into the skin 3 (three) times daily with meals. [provider] Taking Active Self  Insulin Glargine [facility] KWIKPEN) 100 UNIT/ML SOPN [id number] No Inject 12 Units into the skin daily before breakfast.  [provider] Taking Active Self  isosorbide mononitrate (IMDUR) 60 MG 24 hr tablet [id number] No Take 1 tablet (60 mg total) by mouth daily. Edwin Dada, MD Taking Active Self  lamoTRIgine (LAMICTAL) 25 MG tablet [id number] No TAKE 1 TABLET BY MOUTH EVERYDAY AT BEDTIME Meredith Staggers, MD Taking Active   Lancets (ONETOUCH DELICA PLUS NOIBBC48G) [place] [id number] No daily. as directed [provider] Taking Active Self  LORazepam (ATIVAN) 1 MG tablet [id number] No Take 1 tablet (1 mg total) by mouth 2 (two) times daily. [name], MD Taking Active Self  metFORMIN (GLUCOPHAGE) 500 MG tablet [id number] No Take 1 tablet (500 mg total) by mouth 2 (two) times daily. Burnell Blanks, MD Taking Active   metoprolol tartrate (LOPRESSOR) 100 MG tablet [id number] No Take 1 tablet (100 mg total) by mouth 2 (two) times daily. Appointment Required For Further Refills [phone] Edwin Dada, MD Taking Active Self  montelukast (SINGULAIR) 10 MG tablet [id number] No TAKE 1 TABLET BY MOUTH EVERYDAY AT BEDTIME  Collene Gobble, MD Taking Active Self  nitroGLYCERIN (NITROSTAT) 0.4 MG SL tablet [id number] No Place 1 tablet (0.4 mg total) under the tongue every 5 (five) minutes as needed for chest pain. Burnell Blanks, MD Taking Active   [facility] ULTRA test strip [id number] No USE AS DIRECTED 3 TIMES DAILY TO CHECK BLOOD SUGAR [provider] Taking Active Self  Oxycodone HCl 10 MG TABS [id number]  Take 1 tablet (10 mg total) by mouth 3 (three) times daily as needed. Meredith Staggers, MD  Active   OXYGEN [id number] No Inhale 3 L into the lungs continuous. [provider] Taking Active Self  pantoprazole (PROTONIX) 40 MG tablet [id number] No Take 1 tablet (40 mg total) by mouth daily. Cheryln Manly, NP Taking Active   predniSONE (DELTASONE) 5 MG tablet [id number] No Take 5 mg by mouth daily. [provider] Taking Active Self           Med Note Orvan Seen, Sharlette Dense   Thu Jul 11, 2020 11:41 AM) Continuous course   promethazine (PHENERGAN) 25 MG tablet [id number] No Take 1 tablet (25 mg total) by mouth at bedtime as needed for nausea. For nausea and sleep Dwyane Dee, MD Taking Active Self  rosuvastatin (CRESTOR) 40 MG tablet [id number] No Take 1 tablet (40 mg total) by mouth daily. Edwin Dada, MD Taking Active Self  sertraline (ZOLOFT) 100 MG tablet [id number] No Take 100 mg by mouth daily. [provider] Taking Active Self            Patient Active Problem List   Diagnosis Date Noted   Coronary stent  restenosis due to scar tissue    Unstable angina (HCC) 02/19/2021   Acute on chronic diastolic CHF (congestive heart failure) ([place]) 01/29/2021   Acute CHF (congestive heart failure) ([place]) 01/29/2021   Acute on chronic systolic CHF (congestive heart failure) ([place]) 01/29/2021   Non-ST elevation (NSTEMI) myocardial infarction [facility]    Chronic respiratory failure with hypoxia ([place]) 01/01/2021   Flu vaccine need 08/13/2020   Need for pneumococcal  vaccination 08/13/2020   Visit for screening mammogram 08/13/2020   Candidal skin infection 08/13/2020   GAD (generalized anxiety disorder) 08/13/2020   Chronic bronchitis, mucopurulent ([place]) 08/24/2019   Current chronic use of systemic steroids 07/23/2019   OSA (obstructive sleep apnea)    Severe episode of recurrent major depressive disorder, with psychotic features ([place]) 02/16/2019   Obstructive chronic bronchitis without exacerbation ([place]) 11/03/2018   Long term (current) use of anticoagulants 04/28/2018   Renal transplant, status post 01/28/2018   Primary osteoarthritis of right knee 08/18/2017   Mild aortic stenosis by prior echocardiogram 01/28/2017   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   Deficiency anemia 10/14/2016   Normocytic anemia 10/13/2016   Fibromyalgia 03/30/2016   Other spondylosis with radiculopathy, lumbar region 03/30/2016   Type 2 diabetes mellitus with peripheral neuropathy ([place]) 03/30/2016   Paroxysmal atrial fibrillation ([place]); CHA2DS2VASc score F, HTN, CAD, CVA = 5 06/28/2014   Hypertension associated with diabetes ([place])    PAD (peripheral artery disease) ([place]) 08/17/2013   Stenosis of right carotid artery without infarction 10/08/2012   Dyslipidemia, goal LDL below 70 10/08/2012    Class: Diagnosis of   Mitral annular calcification 10/08/2012   Hypomagnesemia 06/13/2012   Renal transplant disorder 06/12/2012   Chronic allergic rhinitis 04/29/2011   Extrinsic asthma 09/09/2007   GERD 09/09/2007   COUGH, CHRONIC 09/09/2007   Morbid obesity - s/p Lap Band 9/'05 05/07/2004    Class: Diagnosis of   Hx of CABG 09/07/1993    Class: History of   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 07/1993   Coronary artery disease involving native coronary artery of native heart with unstable angina pectoris ([place]) 07/1993   ESRD (end stage renal disease) ([place]) 1991    Immunization History  Administered Date(s) Administered   Influenza Split  05/11/2012, 05/17/2016, 05/17/2017   Influenza Whole 06/17/2009, 06/17/2010, 04/18/2011   Influenza,inj,Quad PF,6+ Mos 08/24/2013, 05/20/2015, 04/21/2018, 04/01/2019, 08/13/2020   PFIZER Comirnaty([place])Covid-19 Tri-Sucrose Vaccine 12/30/2019   Pneumococcal Conjugate-13 08/13/2020   Pneumococcal Polysaccharide-23 11/30/2017   Tdap 11/30/2017   Zoster Recombinat (Shingrix) 07/04/2018, 03/08/2019    Conditions to be addressed/monitored:  {USCCMDZASSESSMENTOPTIONS:23563}  There are no care plans that you recently modified to display for this patient.     Medication Assistance: {MEDASSISTANCEINFO:25044}  Compliance/Adherence/Medication fill history: Care Gaps: ***  Patient's preferred pharmacy is:  [id number] [address] [place] [address]Phone: [phone]: [phone] [address] [address]Phone: [phone]: [phone]Phone: [phone]: [phone] [place] [address] [place]Phone: [phone]: [phone]  Uses pill box? {Yes or If no, why not?:20788} Pt endorses ***% compliance  Care Plan and Follow Up Patient Decision:  {FOLLOWUP:24991}  Plan: {CM FOLLOW UP PDPOE:42353} ***  Current Barriers:  {pharmacybarriers:24917}  Pharmacist  Clinical Goal(s):  Patient will {PHARMACYGOALCHOICES:24921} through collaboration with PharmD and provider.   Interventions: 1:1 collaboration with [name], MD regarding development and update of comprehensive plan of care as evidenced by provider attestation and co-signature Inter-disciplinary care team collaboration (see longitudinal plan of care) Comprehensive medication review performed;  medication list updated in electronic medical record  Hyperlipidemia/ CAD/ History of NSTEMI: (LDL goal < 70) -{US controlled/uncontrolled:25276} -Last LDL 27m/dL (02/20/2021) -Current treatment: Rosuvastatin 413mdaily  Clopidogrel 7574m 1 tablet daily  -Medications previously tried: ***  -Current dietary patterns: *** -Current exercise habits: *** -Educated on {CCM HLD Counseling:25126} -{CCMPHARMDINTERVENTION:25122}  Diabetes (A1c goal <7%) -{US controlled/uncontrolled:25276} -Last A1c 9.1% (01/31/2021) -Current medications: Novolog - 14 units 3 times daily with meals  Basaglar - 12 units once daily  Glimepiride 4mg85m1 tablet daily  Metformin 500mg89m tablet twice daily  -Medications previously tried: ***  -Current home glucose readings fasting glucose: *** post prandial glucose: *** -{ACTIONS;DENIES/REPORTS:21021675} hypoglycemic/hyperglycemic symptoms -Current meal patterns:  breakfast: ***  lunch: ***  dinner: *** snacks: *** drinks: *** -Current exercise: *** -Educated on {CCM DM COUNSELING:25123} -Counseled to check feet daily and get yearly eye exams -{CCMPHARMDINTERVENTION:25122}  Atrial Fibrillation (Goal: prevent stroke and major bleeding) -{US controlled/uncontrolled:25276} -CHADSVASC: 5 -Current treatment: Rate control: Dofetilide (Tikosyn) 250mcg72m capsule twice daily, Diltiazem 30mg -42mablet every 4 hours as needed (HR >100 bpm) Metoprolol Tartrate 100mg - 35mblet twice daily  Anticoagulation: Eliquis 5mg - 1 73mlet twice daily  -Medications previously tried: *** -Home BP and HR readings: ***  -Counseled on {CCMAFIBCOUNSELING:25120} -{CCMPHARMDINTERVENTION:25122}  Heart Failure (Goal: manage symptoms and prevent exacerbations) / Hypertension (BP goal <140/90) -{US controlled/uncontrolled:25276} -Last ejection fraction: *** (Date: ***) -HF type: Combined Systolic and Diastolic -NYHA Class: {CHL HP Upstream Pharm NYHA Class:[phone]} -AHA  HF Stage: {CHL HP Upstream Pharm AHA HF Stage:7266781441} -Current treatment: Metoprolol Tartrate 100mg - 1 32met twice daily  Isosorbide Mononitrate 60mg - 1 t24mt daily  Furosemide 40mg - 1 ta16m daily  -Medications previously tried: ***  -Current home BP/HR readings: *** -Current dietary habits: *** -Current exercise habits: *** -Educated on {CCM HF Counseling:25125} -{CCMPHARMDINTERVENTION:25122}  COPD (Goal: control symptoms and prevent exacerbations) -{US controlled/uncontrolled:25276} -Current treatment  Arformoterol ([place] 15mcg/2mL - 72mbulization twice daily  Budesonide 0.5mg/2mL - 1 n70mlization twice daily  Albuterol 108mcg/act HFA 57mler - 2 puffs every 6 hours as needed  Montelukast 10mg - 1 tablet4mly  Oxygen - 3L continously  -Medications previously tried: ***  -Gold Grade: {CHL HP Upstream Pharm COPD Gold Grade:[phone]:[id number]}lassification:  {CHL HP Upstream Pharm COPD [phone]} -MMRC/CAT score: *** -Pulmonary function testing: *** -Exacerbations requiring treatment in last 6 months: *** -Patient {Actions; denies-reports:120008} consistent use of maintenance inhaler -Frequency of rescue inhaler use: *** -Counseled on {CCMINHALERCOUNSELING:25121} -{CCMPHARMDINTERVENTION:25122}  Depression/Anxiety (Goal: Promotion of positive mood) -{US controlled/uncontrolled:25276} -Current treatment: Sertraline 100mg - 1 tablet 86my  Lorazepam 1mg - 1 tablet tw64m daily  Duloxetine 60mg - 1 tablet da85m(being used for fibromyalgia) -Medications previously tried/failed: *** -PHQ9: *** -GAD7: *** -Connected with *** for mental health support -Educated on {CCM mental health counseling:25127} -{CCMPHARMDINTERVENTION:25122}  Fibromyalgia (Goal: Pain control) -{US controlled/uncontrolled:25276} -Current treatment  Duloxetine 60mg - 1 capsule da5m Lamotrigine 25mg - 1 tablet at b29mme  -Medications previously tried: ***   -{CCMPHARMDINTERVENTION:25122}  Allergic Rhinitis (Goal: Prevention/ treatment of allergy attacks) -{US controlled/uncontrolled:25276} -Current treatment  Cetirizine 10mg - 1 tablet daily42muticasone 50mcg/act -33m  2 puffs into each nostril daily  -Medications previously tried: ***  -{CCMPHARMDINTERVENTION:25122}  CKD / kidney transplant (Goal: Prevention of disease progression / prevention of rejection of transplant) -{US controlled/uncontrolled:25276} -Current treatment  Azathioprine 55m - 1253mdaily  Calcitriol 0.2550m- 1 capsule 3 times daily  Prednisone 5mg25m1 tablet daily  -Medications previously tried: ***  -{CCMPHARMDINTERVENTION:25122}  Rheumatoid / Osteoarthritis (Goal: Pain control) -{US controlled/uncontrolled:25276} -Current treatment  Oxycodone 10mg13m tablet 3 times daily  Acetaminophen 500mg 29mtabet daily  -Medications previously tried: ***  -{CCMPHARMDINTERVENTION:25122}  Health Maintenance -Vaccine gaps: *** -Current therapy:  *** -Educated on {ccm supplement counseling:25128} -{CCM Patient satisfied:25129} -{CCMPHARMDINTERVENTION:25122}  Patient Goals/Self-Care Activities Patient will:  - {pharmacypatientgoals:24919}  Follow Up Plan: {CM FOLLOW UP PLAN:2TDDU:20254}ent Chart prep = 50 minutes

## 2021-03-17 ENCOUNTER — Telehealth: Payer: Self-pay | Admitting: *Deleted

## 2021-03-17 NOTE — Telephone Encounter (Signed)
Shree PT with [facility] called to report they were to open Katherine [name] case on Saturday but she reported diarrhea and covid like symptoms with fever. They attempted to call today and she is not answering her phone. They have not been able to start her care.

## 2021-03-18 ENCOUNTER — Telehealth: Payer: Self-pay | Admitting: Internal Medicine

## 2021-03-18 DIAGNOSIS — E1151 Type 2 diabetes mellitus with diabetic peripheral angiopathy without gangrene: Secondary | ICD-10-CM | POA: Diagnosis not present

## 2021-03-18 DIAGNOSIS — M4316 Spondylolisthesis, lumbar region: Secondary | ICD-10-CM | POA: Diagnosis not present

## 2021-03-18 DIAGNOSIS — I152 Hypertension secondary to endocrine disorders: Secondary | ICD-10-CM | POA: Diagnosis not present

## 2021-03-18 DIAGNOSIS — M4726 Other spondylosis with radiculopathy, lumbar region: Secondary | ICD-10-CM | POA: Diagnosis not present

## 2021-03-18 DIAGNOSIS — I480 Paroxysmal atrial fibrillation: Secondary | ICD-10-CM | POA: Diagnosis not present

## 2021-03-18 DIAGNOSIS — F418 Other specified anxiety disorders: Secondary | ICD-10-CM | POA: Diagnosis not present

## 2021-03-18 DIAGNOSIS — G4733 Obstructive sleep apnea (adult) (pediatric): Secondary | ICD-10-CM | POA: Diagnosis not present

## 2021-03-18 DIAGNOSIS — E1142 Type 2 diabetes mellitus with diabetic polyneuropathy: Secondary | ICD-10-CM | POA: Diagnosis not present

## 2021-03-18 DIAGNOSIS — J9611 Chronic respiratory failure with hypoxia: Secondary | ICD-10-CM | POA: Diagnosis not present

## 2021-03-18 DIAGNOSIS — I25110 Atherosclerotic heart disease of native coronary artery with unstable angina pectoris: Secondary | ICD-10-CM | POA: Diagnosis not present

## 2021-03-18 DIAGNOSIS — I252 Old myocardial infarction: Secondary | ICD-10-CM | POA: Diagnosis not present

## 2021-03-18 DIAGNOSIS — K219 Gastro-esophageal reflux disease without esophagitis: Secondary | ICD-10-CM | POA: Diagnosis not present

## 2021-03-18 DIAGNOSIS — M069 Rheumatoid arthritis, unspecified: Secondary | ICD-10-CM | POA: Diagnosis not present

## 2021-03-18 DIAGNOSIS — M160 Bilateral primary osteoarthritis of hip: Secondary | ICD-10-CM | POA: Diagnosis not present

## 2021-03-18 DIAGNOSIS — I080 Rheumatic disorders of both mitral and aortic valves: Secondary | ICD-10-CM | POA: Diagnosis not present

## 2021-03-18 DIAGNOSIS — G43909 Migraine, unspecified, not intractable, without status migrainosus: Secondary | ICD-10-CM | POA: Diagnosis not present

## 2021-03-18 DIAGNOSIS — M797 Fibromyalgia: Secondary | ICD-10-CM | POA: Diagnosis not present

## 2021-03-18 DIAGNOSIS — E1159 Type 2 diabetes mellitus with other circulatory complications: Secondary | ICD-10-CM | POA: Diagnosis not present

## 2021-03-18 DIAGNOSIS — D631 Anemia in chronic kidney disease: Secondary | ICD-10-CM | POA: Diagnosis not present

## 2021-03-18 DIAGNOSIS — G8929 Other chronic pain: Secondary | ICD-10-CM | POA: Diagnosis not present

## 2021-03-18 DIAGNOSIS — I447 Left bundle-branch block, unspecified: Secondary | ICD-10-CM | POA: Diagnosis not present

## 2021-03-18 DIAGNOSIS — N189 Chronic kidney disease, unspecified: Secondary | ICD-10-CM | POA: Diagnosis not present

## 2021-03-18 DIAGNOSIS — J449 Chronic obstructive pulmonary disease, unspecified: Secondary | ICD-10-CM | POA: Diagnosis not present

## 2021-03-18 DIAGNOSIS — E1122 Type 2 diabetes mellitus with diabetic chronic kidney disease: Secondary | ICD-10-CM | POA: Diagnosis not present

## 2021-03-18 DIAGNOSIS — M1711 Unilateral primary osteoarthritis, right knee: Secondary | ICD-10-CM | POA: Diagnosis not present

## 2021-03-18 NOTE — Telephone Encounter (Signed)
   Angel from [facility] calling to report patient has blood sugar reading 600 Declining to go to ED  Call transferred to [facility]

## 2021-03-20 ENCOUNTER — Other Ambulatory Visit: Payer: Self-pay | Admitting: Internal Medicine

## 2021-03-20 ENCOUNTER — Encounter: Payer: Self-pay | Admitting: Internal Medicine

## 2021-03-20 DIAGNOSIS — R61 Generalized hyperhidrosis: Secondary | ICD-10-CM | POA: Diagnosis not present

## 2021-03-20 DIAGNOSIS — R531 Weakness: Secondary | ICD-10-CM | POA: Diagnosis not present

## 2021-03-20 DIAGNOSIS — F411 Generalized anxiety disorder: Secondary | ICD-10-CM

## 2021-03-20 MED ORDER — LORAZEPAM 1 MG PO TABS: 1.0000 mg | ORAL_TABLET | Freq: Two times a day (BID) | ORAL | 5 refills | Status: DC

## 2021-03-20 NOTE — Telephone Encounter (Signed)
No note needed 

## 2021-03-20 NOTE — Telephone Encounter (Signed)
Katherine [name] called in to advise pharmacy will be sending a new refill req over to provider   Would like for provider to approve refill request today bc patient has been w/ out medication for about 3 days

## 2021-03-21 ENCOUNTER — Observation Stay (HOSPITAL_COMMUNITY)
Admission: EM | Admit: 2021-03-21 | Discharge: 2021-03-22 | Disposition: A | Discharge status: Home / Self Care | Payer: Medicare Other | Attending: Internal Medicine | Admitting: Internal Medicine

## 2021-03-21 ENCOUNTER — Other Ambulatory Visit: Payer: Self-pay

## 2021-03-21 ENCOUNTER — Emergency Department (HOSPITAL_COMMUNITY): Payer: Medicare Other

## 2021-03-21 ENCOUNTER — Observation Stay (HOSPITAL_COMMUNITY): Payer: Medicare Other

## 2021-03-21 DIAGNOSIS — Z794 Long term (current) use of insulin: Secondary | ICD-10-CM | POA: Diagnosis not present

## 2021-03-21 DIAGNOSIS — E1159 Type 2 diabetes mellitus with other circulatory complications: Secondary | ICD-10-CM

## 2021-03-21 DIAGNOSIS — Z951 Presence of aortocoronary bypass graft: Secondary | ICD-10-CM | POA: Insufficient documentation

## 2021-03-21 DIAGNOSIS — R0789 Other chest pain: Secondary | ICD-10-CM | POA: Diagnosis not present

## 2021-03-21 DIAGNOSIS — I447 Left bundle-branch block, unspecified: Secondary | ICD-10-CM | POA: Diagnosis not present

## 2021-03-21 DIAGNOSIS — I480 Paroxysmal atrial fibrillation: Secondary | ICD-10-CM | POA: Diagnosis not present

## 2021-03-21 DIAGNOSIS — J9611 Chronic respiratory failure with hypoxia: Secondary | ICD-10-CM | POA: Diagnosis present

## 2021-03-21 DIAGNOSIS — Z79899 Other long term (current) drug therapy: Secondary | ICD-10-CM | POA: Diagnosis not present

## 2021-03-21 DIAGNOSIS — J411 Mucopurulent chronic bronchitis: Secondary | ICD-10-CM | POA: Diagnosis not present

## 2021-03-21 DIAGNOSIS — E1142 Type 2 diabetes mellitus with diabetic polyneuropathy: Secondary | ICD-10-CM | POA: Diagnosis not present

## 2021-03-21 DIAGNOSIS — M25552 Pain in left hip: Secondary | ICD-10-CM | POA: Diagnosis not present

## 2021-03-21 DIAGNOSIS — Z7984 Long term (current) use of oral hypoglycemic drugs: Secondary | ICD-10-CM | POA: Insufficient documentation

## 2021-03-21 DIAGNOSIS — J449 Chronic obstructive pulmonary disease, unspecified: Secondary | ICD-10-CM | POA: Insufficient documentation

## 2021-03-21 DIAGNOSIS — I132 Hypertensive heart and chronic kidney disease with heart failure and with stage 5 chronic kidney disease, or end stage renal disease: Secondary | ICD-10-CM | POA: Insufficient documentation

## 2021-03-21 DIAGNOSIS — I25119 Atherosclerotic heart disease of native coronary artery with unspecified angina pectoris: Secondary | ICD-10-CM | POA: Diagnosis present

## 2021-03-21 DIAGNOSIS — Z87891 Personal history of nicotine dependence: Secondary | ICD-10-CM | POA: Diagnosis not present

## 2021-03-21 DIAGNOSIS — B349 Viral infection, unspecified: Secondary | ICD-10-CM | POA: Diagnosis present

## 2021-03-21 DIAGNOSIS — Z7901 Long term (current) use of anticoagulants: Secondary | ICD-10-CM | POA: Diagnosis not present

## 2021-03-21 DIAGNOSIS — N186 End stage renal disease: Secondary | ICD-10-CM | POA: Diagnosis not present

## 2021-03-21 DIAGNOSIS — Z940 Kidney transplant status: Secondary | ICD-10-CM | POA: Diagnosis not present

## 2021-03-21 DIAGNOSIS — R509 Fever, unspecified: Secondary | ICD-10-CM | POA: Diagnosis not present

## 2021-03-21 DIAGNOSIS — I517 Cardiomegaly: Secondary | ICD-10-CM | POA: Diagnosis not present

## 2021-03-21 DIAGNOSIS — E7800 Pure hypercholesterolemia, unspecified: Secondary | ICD-10-CM | POA: Diagnosis not present

## 2021-03-21 DIAGNOSIS — I2510 Atherosclerotic heart disease of native coronary artery without angina pectoris: Secondary | ICD-10-CM | POA: Diagnosis present

## 2021-03-21 DIAGNOSIS — Z20822 Contact with and (suspected) exposure to covid-19: Secondary | ICD-10-CM | POA: Insufficient documentation

## 2021-03-21 DIAGNOSIS — I152 Hypertension secondary to endocrine disorders: Secondary | ICD-10-CM | POA: Diagnosis not present

## 2021-03-21 DIAGNOSIS — E1122 Type 2 diabetes mellitus with diabetic chronic kidney disease: Secondary | ICD-10-CM | POA: Diagnosis not present

## 2021-03-21 DIAGNOSIS — R079 Chest pain, unspecified: Secondary | ICD-10-CM | POA: Diagnosis not present

## 2021-03-21 DIAGNOSIS — R0602 Shortness of breath: Secondary | ICD-10-CM | POA: Diagnosis present

## 2021-03-21 DIAGNOSIS — Z955 Presence of coronary angioplasty implant and graft: Secondary | ICD-10-CM | POA: Insufficient documentation

## 2021-03-21 DIAGNOSIS — I214 Non-ST elevation (NSTEMI) myocardial infarction: Secondary | ICD-10-CM | POA: Diagnosis present

## 2021-03-21 DIAGNOSIS — E785 Hyperlipidemia, unspecified: Secondary | ICD-10-CM | POA: Diagnosis present

## 2021-03-21 DIAGNOSIS — M1990 Unspecified osteoarthritis, unspecified site: Secondary | ICD-10-CM | POA: Diagnosis not present

## 2021-03-21 DIAGNOSIS — I5023 Acute on chronic systolic (congestive) heart failure: Secondary | ICD-10-CM | POA: Insufficient documentation

## 2021-03-21 DIAGNOSIS — M545 Low back pain, unspecified: Secondary | ICD-10-CM | POA: Diagnosis not present

## 2021-03-21 DIAGNOSIS — I10 Essential (primary) hypertension: Secondary | ICD-10-CM | POA: Diagnosis present

## 2021-03-21 DIAGNOSIS — E1169 Type 2 diabetes mellitus with other specified complication: Secondary | ICD-10-CM | POA: Diagnosis present

## 2021-03-21 LAB — BASIC METABOLIC PANEL
Anion gap: 10 (ref 5–15)
BUN: 10 mg/dL (ref 8–23)
CO2: 28 mmol/L (ref 22–32)
Calcium: 8.8 mg/dL — ABNORMAL LOW (ref 8.9–10.3)
Chloride: 100 mmol/L (ref 98–111)
Creatinine, Ser: 0.96 mg/dL (ref 0.44–1.00)
GFR, Estimated: >60 mL/min (ref >60)
Glucose, Bld: 219 mg/dL — ABNORMAL HIGH (ref 70–99)
Potassium: 3.5 mmol/L (ref 3.5–5.1)
Sodium: 138 mmol/L (ref 135–145)

## 2021-03-21 LAB — CBC
HCT: 32.9 % — ABNORMAL LOW (ref 36.0–46.0)
Hemoglobin: 10.5 g/dL — ABNORMAL LOW (ref 12.0–15.0)
MCH: 30.8 pg (ref 26.0–34.0)
MCHC: 31.9 g/dL (ref 30.0–36.0)
MCV: 96.5 fL (ref 80.0–100.0)
Platelets: 207 10*3/uL (ref 150–400)
RBC: 3.41 MIL/uL — ABNORMAL LOW (ref 3.87–5.11)
RDW: 15.9 % — ABNORMAL HIGH (ref 11.5–15.5)
WBC: 9.2 10*3/uL (ref 4.0–10.5)
nRBC: 0.2 % (ref 0.0–0.2)

## 2021-03-21 LAB — RESP PANEL BY RT-PCR (FLU A&B, COVID) ARPGX2
Influenza A by PCR: NEGATIVE
Influenza B by PCR: NEGATIVE
SARS Coronavirus 2 by RT PCR: NEGATIVE

## 2021-03-21 LAB — GLUCOSE, CAPILLARY: Glucose-Capillary: 142 mg/dL — ABNORMAL HIGH (ref 70–99)

## 2021-03-21 LAB — SEDIMENTATION RATE: Sed Rate: 56 mm/hr — ABNORMAL HIGH (ref 0–22)

## 2021-03-21 LAB — TROPONIN I (HIGH SENSITIVITY)
Troponin I (High Sensitivity): 26 ng/L — ABNORMAL HIGH (ref <18)
Troponin I (High Sensitivity): 23 ng/L — ABNORMAL HIGH (ref <18)

## 2021-03-21 LAB — C-REACTIVE PROTEIN: CRP: 3.7 mg/dL — ABNORMAL HIGH (ref <1.0)

## 2021-03-21 MED ORDER — PROMETHAZINE HCL 25 MG PO TABS: 25.0000 mg | ORAL_TABLET | Freq: Every evening | ORAL | Status: DC | PRN

## 2021-03-21 MED ORDER — OXYCODONE HCL 5 MG PO TABS
10.0000 mg | ORAL_TABLET | Freq: Once | ORAL | Status: AC
  Administered 2021-03-21: 10 mg via ORAL
  Filled 2021-03-21: qty 2

## 2021-03-21 MED ORDER — INSULIN ASPART 100 UNIT/ML IJ SOLN
4.0000 [IU] | Freq: Three times a day (TID) | INTRAMUSCULAR | Status: DC
  Administered 2021-03-22 (×3): 4 [IU] via SUBCUTANEOUS

## 2021-03-21 MED ORDER — CYCLOBENZAPRINE HCL 10 MG PO TABS: 10.0000 mg | ORAL_TABLET | Freq: Every day | ORAL | Status: DC | PRN

## 2021-03-21 MED ORDER — APIXABAN 5 MG PO TABS
5.0000 mg | ORAL_TABLET | Freq: Two times a day (BID) | ORAL | Status: DC
  Administered 2021-03-21 – 2021-03-22 (×2): 5 mg via ORAL
  Filled 2021-03-21 (×2): qty 1

## 2021-03-21 MED ORDER — ISOSORBIDE MONONITRATE ER 30 MG PO TB24
60.0000 mg | ORAL_TABLET | Freq: Every day | ORAL | Status: DC
  Administered 2021-03-22: 60 mg via ORAL
  Filled 2021-03-21: qty 2

## 2021-03-21 MED ORDER — PREDNISONE 5 MG PO TABS
5.0000 mg | ORAL_TABLET | Freq: Every day | ORAL | Status: DC
  Administered 2021-03-21: 5 mg via ORAL
  Filled 2021-03-21: qty 1

## 2021-03-21 MED ORDER — BUDESONIDE 0.5 MG/2ML IN SUSP
2.0000 mL | Freq: Two times a day (BID) | RESPIRATORY_TRACT | Status: DC
  Administered 2021-03-21 – 2021-03-22 (×2): 0.5 mg via RESPIRATORY_TRACT
  Filled 2021-03-21 (×2): qty 2

## 2021-03-21 MED ORDER — ROSUVASTATIN CALCIUM 20 MG PO TABS
40.0000 mg | ORAL_TABLET | Freq: Every day | ORAL | Status: DC
  Administered 2021-03-22: 40 mg via ORAL
  Filled 2021-03-21: qty 2

## 2021-03-21 MED ORDER — ARFORMOTEROL TARTRATE 15 MCG/2ML IN NEBU
15.0000 ug | INHALATION_SOLUTION | Freq: Two times a day (BID) | RESPIRATORY_TRACT | Status: DC
  Administered 2021-03-21 – 2021-03-22 (×2): 15 ug via RESPIRATORY_TRACT
  Filled 2021-03-21 (×2): qty 2

## 2021-03-21 MED ORDER — METOPROLOL TARTRATE 25 MG PO TABS
100.0000 mg | ORAL_TABLET | Freq: Two times a day (BID) | ORAL | Status: DC
  Administered 2021-03-21 – 2021-03-22 (×2): 100 mg via ORAL
  Filled 2021-03-21 (×2): qty 4

## 2021-03-21 MED ORDER — INSULIN ASPART 100 UNIT/ML IJ SOLN
0.0000 [IU] | Freq: Three times a day (TID) | INTRAMUSCULAR | Status: DC
  Administered 2021-03-22 (×3): 3 [IU] via SUBCUTANEOUS

## 2021-03-21 MED ORDER — FLUTICASONE PROPIONATE 50 MCG/ACT NA SUSP
2.0000 | Freq: Two times a day (BID) | NASAL | Status: DC
  Administered 2021-03-21 – 2021-03-22 (×2): 2 via NASAL
  Filled 2021-03-21: qty 16

## 2021-03-21 MED ORDER — AZATHIOPRINE 50 MG PO TABS
125.0000 mg | ORAL_TABLET | Freq: Every day | ORAL | Status: DC
  Administered 2021-03-22: 125 mg via ORAL
  Filled 2021-03-21: qty 3

## 2021-03-21 MED ORDER — INSULIN GLARGINE-YFGN 100 UNIT/ML ~~LOC~~ SOLN
15.0000 [IU] | Freq: Every day | SUBCUTANEOUS | Status: DC
  Administered 2021-03-22: 15 [IU] via SUBCUTANEOUS
  Filled 2021-03-21 (×2): qty 0.15

## 2021-03-21 MED ORDER — OXYCODONE HCL 5 MG PO TABS
10.0000 mg | ORAL_TABLET | Freq: Three times a day (TID) | ORAL | Status: DC | PRN
  Administered 2021-03-21 – 2021-03-22 (×2): 10 mg via ORAL
  Filled 2021-03-21 (×2): qty 2

## 2021-03-21 MED ORDER — PANTOPRAZOLE SODIUM 40 MG PO TBEC
40.0000 mg | DELAYED_RELEASE_TABLET | Freq: Every day | ORAL | Status: DC
  Administered 2021-03-22: 40 mg via ORAL
  Filled 2021-03-21: qty 1

## 2021-03-21 MED ORDER — ALBUTEROL SULFATE (2.5 MG/3ML) 0.083% IN NEBU: 2.5000 mg | INHALATION_SOLUTION | Freq: Four times a day (QID) | RESPIRATORY_TRACT | Status: DC | PRN

## 2021-03-21 MED ORDER — SERTRALINE HCL 100 MG PO TABS
200.0000 mg | ORAL_TABLET | Freq: Every day | ORAL | Status: DC
  Administered 2021-03-21: 200 mg via ORAL
  Filled 2021-03-21: qty 2

## 2021-03-21 MED ORDER — LAMOTRIGINE 25 MG PO TABS
25.0000 mg | ORAL_TABLET | Freq: Every day | ORAL | Status: DC
  Administered 2021-03-21: 25 mg via ORAL
  Filled 2021-03-21 (×2): qty 1

## 2021-03-21 MED ORDER — DOFETILIDE 250 MCG PO CAPS
250.0000 ug | ORAL_CAPSULE | Freq: Two times a day (BID) | ORAL | Status: DC
  Administered 2021-03-21 – 2021-03-22 (×2): 250 ug via ORAL
  Filled 2021-03-21 (×2): qty 1

## 2021-03-21 MED ORDER — CLOPIDOGREL BISULFATE 75 MG PO TABS
75.0000 mg | ORAL_TABLET | Freq: Every day | ORAL | Status: DC
  Administered 2021-03-22: 75 mg via ORAL
  Filled 2021-03-21: qty 1

## 2021-03-21 MED ORDER — LORAZEPAM 1 MG PO TABS
1.0000 mg | ORAL_TABLET | Freq: Once | ORAL | Status: AC
  Administered 2021-03-21: 1 mg via ORAL
  Filled 2021-03-21: qty 1

## 2021-03-21 MED ORDER — CYCLOBENZAPRINE HCL 10 MG PO TABS
20.0000 mg | ORAL_TABLET | Freq: Every day | ORAL | Status: DC
  Administered 2021-03-21: 20 mg via ORAL
  Filled 2021-03-21: qty 2

## 2021-03-21 MED ORDER — CALCITRIOL 0.25 MCG PO CAPS: 0.2500 ug | ORAL_CAPSULE | ORAL | Status: DC

## 2021-03-21 MED ORDER — LORAZEPAM 1 MG PO TABS
1.0000 mg | ORAL_TABLET | Freq: Two times a day (BID) | ORAL | Status: DC
  Administered 2021-03-21 – 2021-03-22 (×2): 1 mg via ORAL
  Filled 2021-03-21: qty 1
  Filled 2021-03-21: qty 2

## 2021-03-21 MED ORDER — DULOXETINE HCL 60 MG PO CPEP
60.0000 mg | ORAL_CAPSULE | Freq: Every morning | ORAL | Status: DC
  Administered 2021-03-22: 60 mg via ORAL
  Filled 2021-03-21: qty 1

## 2021-03-21 MED ORDER — ONDANSETRON HCL 4 MG/2ML IJ SOLN: 4.0000 mg | Freq: Four times a day (QID) | INTRAMUSCULAR | Status: DC | PRN

## 2021-03-21 MED ORDER — ACETAMINOPHEN 325 MG PO TABS
650.0000 mg | ORAL_TABLET | ORAL | Status: DC | PRN
  Filled 2021-03-21: qty 2

## 2021-03-21 MED ORDER — MONTELUKAST SODIUM 10 MG PO TABS
10.0000 mg | ORAL_TABLET | Freq: Every day | ORAL | Status: DC
  Administered 2021-03-21: 10 mg via ORAL
  Filled 2021-03-21: qty 1

## 2021-03-21 NOTE — ED Notes (Signed)
Delay in labs do to hard stick

## 2021-03-21 NOTE — Consult Note (Signed)
Cardiology Consultation:   Patient ID: Leigha [name] MRN: [id number]; DOB: Aug 10, 1956  Admit date: 03/21/2021 Date of Consult: 03/21/2021  PCP:  [name], MD   [facility] Providers Cardiologist:  [name], MD   Patient Profile:   Katherine [name] is a 65 y.o. female with a hx of CAD s/p CABG with PCI to SVG-D1, persistent atrial fibrillation on tikosyn and eliquis, HLD, PAD, COPD, DM, and renal transplant in 1991 who is being seen 03/21/2021 for the evaluation of chest pain at the request of Dr. Tamera Punt.  History of Present Illness:   [name] has an extensive history of CAD. She underwent CABG in 1995 (LIMA-LAD, SVG-D, SVG-dRCA). Starting in 2001, she has had subsequent, repeated PCI's. Her most recent heart cath 01/31/21 for chest pain showed continued severe native CAD with CTO of LAD and RCA, new 60% stenosis in the L Cx, and SVG-D2 with recurrent high grade 90% ISR with 95% lesion in the D2 after graft insertion. Medical therapy was pursued with plans for possible intervention on ostial L Cx and repeat PCI of SVG-D2 if she fails medical therapy. She was brought back to the lab 02/20/21. RFR negative in L Cx, PCI deferred. DES placed to 95% Iesion in D2 after graft insertion, borderline successful PTCA of ISR in the graft.   She has maintained sinus rhythm on tikosyn and is anticoagulated with eliquis. Echo 2019 with LVEF 55-60% and grade 2 DD, mild AS. She had cardiac MRI for possible myocarditis in 12/2019 that was negatiev with normal aortic root. She has chronic respiratory failure on home O2. Most recent echo 01/30/21 with LVEF 55-60%, grade 1 DD, mild LVH, mild AS.   She presented to [facility] with productive cough and chest discomfort different than her normal angina. She is concerned she has PNA. CXR was clear of PNA. She is signficantly hypertensive at 187/91. During my interview, she describes several days of flu-like symptom, fever, chills, body aches, congestion,  productive cough, and fatigue. She states she is having chest discomfort, but this does not feel like her prior MI's or angina. She is currently chest pain free.  She has had poor PO intake over the last 2 days. History is somewhat difficult, stating she just feels bad.   HS troponin 26, delta pending EKG stable from prior and nonischemic CXR negative for active disease   Past Medical History:  Diagnosis Date   Anemia    Anxiety    Bilateral carotid artery stenosis    Carotid duplex 0/3500: 9-38% LICA, 18-29% RICA, >93% RECA, f/u 1 yr suggested   CAD (coronary artery disease) of bypass graft 5/01; 3/'02, 8/'03, 10/'04; 1/15   PCI x 5 to SVG-D1    CAD in native artery 07/1993   3 Vessel Disease (LAD-D1 & RCA) -- CABG (Dx in setting of inferior STEMI-PTCA of RCA)   CAD S/P percutaneous coronary angioplasty    PCI to SVG-D1 insertion/native D1 x 4 = '01 -(S660 BMS 2.5 x 9 anastomosis- D1); '02 - distal overlap ACS Pixel 2.5 x 8  BMS; '03 distal/native ISR/Thrombosis - Pixel 2.5 x 13; '04 - ISR-  Taxus 2.5 x 20 (covered all);; 1/15 - mid SVG-D1 (50% distal ISR) - Promus P 2.75 x 20 -- 2.8 mm   COPD mixed type (HCC)    Followed by Dr. Lamonte Sakai "pulmonologist said no COPD"   Depression    Depression with anxiety    Diabetes mellitus type 2 in obese ([place])  Diarrhea    started after cholecystectomy and mass removed from intestine   Dyslipidemia, goal LDL below 70    08/2012: TC 137, TG 200, HDL 32!, LDL 45; on statin (followed by Dr.Deterding)   ESRD (end stage renal disease) ([place]) 1991   s/p Cadaveric Renal Transplant [facility] - Dr. Jimmy Footman)    Family history of adverse reaction to anesthesia    mom's bp dropped during/after anesthesia   Fibromyalgia    GERD (gastroesophageal reflux disease)    Glomerulonephritis, chronic, rapidly progressive 1989   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 07/1993   Rescue PTCA of RCA -- referred for CABG.   H/O: GI bleed     Headache    migraines in the past   History of CABG x 3 08/1993   Dr. Servando Snare: LIMA-LAD, SVG-bifurcatingD1, SVG-rPDA   History of kidney stones    History of stroke 2012   "right eye stroke- half blind now"   Hypertension associated with diabetes ([place])    Mild aortic stenosis by prior echocardiogram 07/2019   Echo:  Mild aortic stenosis (gradients: Mean 14.3 mmHg -peak 24.9 mmHg).   Morbid obesity (HCC)    MRSA (methicillin resistant staph aureus) culture positive    OSA (obstructive sleep apnea)    no longer on CPAP or home O2, states she doesn't need now after lap band   PAD (peripheral artery disease) ([place]) 08/2013   LEA Dopplers to be read by Dr. Fletcher Anon   PAF (paroxysmal atrial fibrillation) ([place]) 06/2014   Noted on CardioNet Monitor  - --> rhythm control with Tikosyn (Dr. Rayann Heman); converted from warfarin to apixaban for anticoagulation.   Pneumonia    Recurrent boils    Bilateral Groin   Rheumatoid arthritis ([place])    Per Patient Report; associated with OA   S/p cadaver renal transplant 1991   [facility]   Unstable angina ([place]) 5/01; 3/'02, 8/'03, 10/'04; 1/15   x 5 occurences since Inf-Post STEMI in 1994    Past Surgical History:  Procedure Laterality Date   ABDOMINAL AORTOGRAM N/A 04/21/2018   Procedure: ABDOMINAL AORTOGRAM;  Surgeon: [name], MD;  Location: [place] CV LAB;  Service: Cardiovascular;  Laterality: N/A;   CATHETER REMOVAL     CHOLECYSTECTOMY N/A 10/29/2014   Procedure: LAPAROSCOPIC CHOLECYSTECTOMY WITH INTRAOPERATIVE CHOLANGIOGRAM;  Surgeon: Excell Seltzer, MD;  Location: [facility];  Service: General;  Laterality: N/A;   CORONARY ANGIOPLASTY  1994   x5   CORONARY ARTERY BYPASS GRAFT  1995   LIMA-LAD, SVG-RPDA, SVG-D1   CORONARY STENT INTERVENTION N/A 02/20/2021   Procedure: CORONARY STENT INTERVENTION;  Surgeon: [name], MD;  Location: [place] CV LAB;  Service: Cardiovascular;  Laterality: N/A;   ESOPHAGOGASTRODUODENOSCOPY N/A 10/15/2016    Procedure: ESOPHAGOGASTRODUODENOSCOPY (EGD);  Surgeon: Wilford Corner, MD;  Location: [facility] ENDOSCOPY;  Service: Endoscopy;  Laterality: N/A;   I & D EXTREMITY Right 01/29/2018   Procedure: IRRIGATION AND DEBRIDEMENT THUMB;  Surgeon: Dayna Barker, MD;  Location: [place];  Service: Plastics;  Laterality: Right;   INCISE AND DRAIN ABCESS     INTRAVASCULAR PRESSURE WIRE/FFR STUDY N/A 02/20/2021   Procedure: INTRAVASCULAR PRESSURE WIRE/FFR STUDY;  Surgeon: [name], MD;  Location: [place] CV LAB;  Service: Cardiovascular;  Laterality: N/A;   KIDNEY TRANSPLANT  1991   KNEE ARTHROSCOPY WITH LATERAL MENISECTOMY Left 12/03/2017   Procedure: LEFT KNEE ARTHROSCOPY WITH LATERAL MENISECTOMY;  Surgeon: Earlie Server, MD;  Location: [place];  Service:  Orthopedics;  Laterality: Left;   LAPAROSCOPIC GASTRIC BANDING  04/2004; 10/'09, 2/'10   Port Replacement x 2   LEFT HEART CATH AND CORONARY ANGIOGRAPHY N/A 02/20/2021   Procedure: LEFT HEART CATH AND CORONARY ANGIOGRAPHY;  Surgeon: [name], MD;  Location: [place] CV LAB;  Service: Cardiovascular;  Laterality: N/A;   LEFT HEART CATH AND CORS/GRAFTS ANGIOGRAPHY N/A 04/21/2018   Procedure: LEFT HEART CATH AND CORS/GRAFTS ANGIOGRAPHY;  Surgeon: [name], MD;  Location: [place] CV LAB;  Ost-Prox LAD 50% - proxLAD (pre & post D1) 100% CTO. Cx - patent, small OM1 (stable ~ ostial OM1 90%, too small for PCI) & 2 small LPL; Ost-distal RCA 100% CTO.  LIMA-LAD (not injected); SVG-dRCA patent, SVG-D1 - insertion stent ~20% ISR - Severe R CFA disease w/ focal Sub TO   LEFT HEART CATH AND CORS/GRAFTS ANGIOGRAPHY  5/'01, 3/'02, 8/'03, 10/'04; 1/'15   08/22/2013: LAD & RCA 100%; LIMA-LAD & SVG-rPDA patent; Cx-- OM1 60%, OM2 ostial ~50%; SVG-D1 - 80% mid, 50% distal ISR --PCI   LEFT HEART CATH AND CORS/GRAFTS ANGIOGRAPHY N/A 01/31/2021   Procedure: LEFT HEART CATH AND CORS/GRAFTS ANGIOGRAPHY;  Surgeon: Jolaine Artist, MD;  Location: [place] CV LAB;   Service: Cardiovascular;  Laterality: N/A;   LEFT HEART CATHETERIZATION WITH CORONARY/GRAFT ANGIOGRAM N/A 08/23/2013   Procedure: LEFT HEART CATHETERIZATION WITH Beatrix Fetters;  Surgeon: Wellington Hampshire, MD;  Location: [place] CATH LAB;  Service: Cardiovascular;  Laterality: N/A;   Lower Extremity Arterial Dopplers  08/2013   ABI: R 0.96, L 1.04   MULTIPLE TOOTH EXTRACTIONS  age 26   NM MYOVIEW LTD  03/2016   EF 62%. LOW RISK. C/W prior MI - no Ischemia. Apical hypokinesis.   PERCUTANEOUS CORONARY STENT INTERVENTION (PCI-S)  5/'01, 3/'02, 8/'03, 10/'04;   '01 - S660 BMS 2.5 x 9 - dSVG-D1 into D1; '02- post-stent stenosis - 2.5 x 8 Pixel BMS; '8\03: ISR/Thrombosis into native D1 - AngioJet, 2.5 x 13 Pixel; '04 - ISR 95% - covered stented area with Taxus DES 2.5 mm x 20 (2.88)   PERCUTANEOUS CORONARY STENT INTERVENTION (PCI-S)  08/23/2013   Procedure: PERCUTANEOUS CORONARY STENT INTERVENTION (PCI-S);  Surgeon: Wellington Hampshire, MD;  Location[facility] CATH LAB;  Service: Cardiovascular;;mid SVG-D1 80%; distal stent ~50% ISR; Promus [place] DES 2.75 mm xc 20 mm (2.8 mm)   PORT-A-CATH REMOVAL     kidney   TRANSTHORACIC ECHOCARDIOGRAM  07/2019   EF 55 to 60%.  No LVH.  Paradoxical septal wall motion-post CABG.  GRII DD.  Normal RV size and function.  Mild bilateral atrial dilation.  Moderate MAC.  Trace MR.  Mild aortic stenosis (gradients: Mean 14.3 mmHg -peak 24.9 mmHg).   TUBAL LIGATION     wrist fistula repair Left    dialysis for one year     Home Medications:  Prior to Admission medications   Medication Sig Start Date End Date Taking? Authorizing Provider  acetaminophen (TYLENOL) 500 MG tablet Take 1,500 mg by mouth daily.    [provider]  albuterol (VENTOLIN HFA) 108 (90 Base) MCG/ACT inhaler TAKE 2 PUFFS BY MOUTH EVERY 6 HOURS AS NEEDED FOR WHEEZE OR SHORTNESS OF BREATH 02/10/21   Collene Gobble, MD  amoxicillin (AMOXIL) 250 MG capsule Take 1 capsule (250 mg total) by mouth 3  (three) times daily. 03/12/21   Meredith Staggers, MD  arformoterol (BROVANA) 15 MCG/2ML NEBU Take 2 mLs (15 mcg total) by nebulization 2 (  two) times daily. 08/30/19   Collene Gobble, MD  azaTHIOprine (IMURAN) 50 MG tablet Take 125 mg by mouth daily.     [provider]  B-D ULTRAFINE III SHORT PEN 31G X 8 MM MISC See admin instructions. 11/06/19   [provider]  Blood Glucose Monitoring Suppl (ONE TOUCH ULTRA 2) w/Device KIT 2 (two) times daily. 04/08/20   [provider]  budesonide (PULMICORT) 0.5 MG/2ML nebulizer solution Take 2 mLs (0.5 mg total) by nebulization 2 (two) times daily. 08/24/19   [name], MD  calcitRIOL (ROCALTROL) 0.25 MCG capsule Take 0.25 mcg by mouth every 3 (three) days.     [provider]  cetirizine (ZYRTEC) 10 MG tablet Take 10 mg by mouth daily.    [provider]  clopidogrel (PLAVIX) 75 MG tablet Take 1 tablet (75 mg total) by mouth daily. 02/22/21   Cheryln Manly, NP  clotrimazole (MYCELEX) 10 MG troche Take 1 tablet (10 mg total) by mouth 5 (five) times daily as needed (thrush). 02/03/21   Danford, Suann Larry, MD  cyclobenzaprine (FLEXERIL) 10 MG tablet TAKE 1 TO 2 TABS BY MOUTH DAILY. TAKE 2 TAB DAILY AT BEDTIME, MAY ALSO TAKE 1 TABLET BY MOUTH AT NOON AS NEEDED FOR MUSCLE SPASMS 01/30/21   Meredith Staggers, MD  diltiazem (CARDIZEM) 30 MG tablet take 1 tablet every 4 hours AS NEEDED for afib heart rate >100 as long as top blood pressure >100. 03/04/21   Sherran Needs, NP  dofetilide (TIKOSYN) 250 MCG capsule Take 1 capsule (250 mcg total) by mouth 2 (two) times daily. 03/04/21   Sherran Needs, NP  DULoxetine (CYMBALTA) 30 MG capsule TAKE 2 CAPSULES (60 MG TOTAL) BY MOUTH EVERY MORNING. 10/30/20   Bayard Hugger, NP  ELIQUIS 5 MG TABS tablet TAKE 1 TABLET BY MOUTH TWICE A DAY (TO REPLACE WARFARIN) 10/02/20   [name], MD  fluticasone [facility]) 50 MCG/ACT nasal spray PLACE 2 SPRAYS INTO BOTH NOSTRILS 2  TIMES DAILY 12/17/20   Collene Gobble, MD  furosemide (LASIX) 40 MG tablet Take 1 tablet (40 mg total) by mouth daily. 07/14/20   Dwyane Dee, MD  glimepiride (AMARYL) 4 MG tablet Take 4 mg by mouth daily. 02/03/20   [provider]  insulin aspart (NOVOLOG FLEXPEN) 100 UNIT/ML FlexPen Inject 14 Units into the skin 3 (three) times daily with meals.    [provider]  Insulin Glargine (BASAGLAR KWIKPEN) 100 UNIT/ML SOPN Inject 12 Units into the skin daily before breakfast.     [provider]  isosorbide mononitrate (IMDUR) 60 MG 24 hr tablet Take 1 tablet (60 mg total) by mouth daily. 02/04/21   Edwin Dada, MD  lamoTRIgine (LAMICTAL) 25 MG tablet TAKE 1 TABLET BY MOUTH EVERYDAY AT BEDTIME 03/05/21   Meredith Staggers, MD  Lancets [facility] DELICA PLUS [id number]) MISC daily. as directed 03/26/20   [provider]  LORazepam (ATIVAN) 1 MG tablet Take 1 tablet (1 mg total) by mouth 2 (two) times daily. 03/20/21   [name], MD  metFORMIN (GLUCOPHAGE) 500 MG tablet Take 1 tablet (500 mg total) by mouth 2 (two) times daily. 02/22/21   Burnell Blanks, MD  metoprolol tartrate (LOPRESSOR) 100 MG tablet Take 1 tablet (100 mg total) by mouth 2 (two) times daily. Appointment Required For Further Refills [phone] 02/03/21   Edwin Dada, MD  montelukast (SINGULAIR) 10 MG tablet TAKE 1 TABLET BY MOUTH  EVERYDAY AT BEDTIME 02/10/21   Collene Gobble, MD  nitroGLYCERIN (NITROSTAT) 0.4 MG SL tablet Place 1 tablet (0.4 mg total) under the tongue every 5 (five) minutes as needed for chest pain. 02/21/21   Burnell Blanks, MD  ONETOUCH ULTRA test strip USE AS DIRECTED 3 TIMES DAILY TO CHECK BLOOD SUGAR 11/04/19   [provider]  Oxycodone HCl 10 MG TABS Take 1 tablet (10 mg total) by mouth 3 (three) times daily as needed. 03/12/21   Meredith Staggers, MD  OXYGEN Inhale 3 L into the lungs continuous.    [provider]   pantoprazole (PROTONIX) 40 MG tablet Take 1 tablet (40 mg total) by mouth daily. 02/21/21 05/22/21  Cheryln Manly, NP  predniSONE (DELTASONE) 5 MG tablet Take 5 mg by mouth daily. 06/19/19   [provider]  promethazine (PHENERGAN) 25 MG tablet Take 1 tablet (25 mg total) by mouth at bedtime as needed for nausea. For nausea and sleep 07/14/20   Dwyane Dee, MD  rosuvastatin (CRESTOR) 40 MG tablet Take 1 tablet (40 mg total) by mouth daily. 02/03/21   Danford, Suann Larry, MD  sertraline (ZOLOFT) 100 MG tablet Take 100 mg by mouth daily. 06/12/20   [provider]    Inpatient Medications: Scheduled Meds:  Continuous Infusions:  PRN Meds:   Allergies:    Allergies  Allergen Reactions   Tetracycline Hives    Patient tolerated Doxycycline Dec 2020   Niacin Other (See Comments)    Mouth blisters   Niaspan [Niacin Er] Other (See Comments)    Mouth blisters   Sulfa Antibiotics Nausea Only and Other (See Comments)    "Tears up stomach"   Sulfonamide Derivatives Other (See Comments)    Reaction: per patient "tears her stomach up"   Codeine Nausea And Vomiting   Erythromycin Nausea And Vomiting   Hydromorphone Hcl Nausea And Vomiting   Morphine And Related Nausea And Vomiting   Nalbuphine Nausea And Vomiting    Nubain   Sulfasalazine Nausea Only and Other (See Comments)    per patient "tears her stomach up", "Tears up stomach"   Tape Rash and Other (See Comments)    No "plastic" tape," please----cloth tape only    Social History:   Social History   Socioeconomic History   Marital status: Widowed    Spouse name: Not on file   Number of children: Not on file   Years of education: Not on file   Highest education level: Not on file  Occupational History   Not on file  Tobacco Use   Smoking status: Former    Packs/day: 1.00    Years: 30.00    Pack years: 30.00    Types: Cigarettes    Quit date: 08/17/2002    Years since quitting: 18.6   Smokeless  tobacco: Never  Vaping Use   Vaping Use: Never used  Substance and Sexual Activity   Alcohol use: No   Drug use: No   Sexual activity: Not on file  Other Topics Concern   Not on file  Social History Narrative   She is currently married, and the caregiver of her husband who is recovering from surgery for tongue cancer now diagnosed with lung cancer. Prior to his diagnosis of her husband, she actually had adopted a 47-year-old child who she knows caring for as well. With all the surgeries, they have been quite financially troubled. Thanks the help of her community and church, they have been  able to stay "alfoat."     She is a former smoker who quit in 2004 after a 30-pack-year history.   She is active chasing a 10-year-old child, does not do routine exercise. She's been quite depressed with the condition of her husband, and admits to eating comfort herself.   She does not drink alcohol.   Social Determinants of Health   Financial Resource Strain: Not on file  Food Insecurity: No Food Insecurity   Worried About Charity fundraiser in the Last Year: Never true   Ran Out of Food in the Last Year: Never true  Transportation Needs: No Transportation Needs   Lack of Transportation (Medical): No   Lack of Transportation (Non-Medical): No  Physical Activity: Not on file  Stress: Not on file  Social Connections: Not on file  Intimate Partner Violence: Not on file    Family History:    Family History  Problem Relation Age of Onset   Cancer Mother        liver   Heart disease Father    Cancer Father        colon   Arrhythmia Brother        Atrial Fibrillation   Arrhythmia Paternal Aunt        Atrial Fibrillation     ROS:  Please see the history of present illness.   All other ROS reviewed and negative.     Physical Exam/Data:   Vitals:   03/21/21 1415 03/21/21 1430 03/21/21 1445 03/21/21 1500  BP: (!) 170/74 (!) 187/91 (!) 176/160 (!) 143/71  Pulse: 80 85 82 79  Resp: _0 (!) 21  Temp:      TempSrc:      SpO2: 100% 98% 99% 96%  Weight:      Height:       No intake or output data in the 24 hours ending 03/21/21 1519 Last 3 Weights 03/21/2021 03/12/2021 02/20/2021  Weight (lbs) 215 lb 233 lb 233 lb 4 oz  Weight (kg) 97.523 kg 105.688 kg 105.8 kg     Body mass index is 40.62 kg/m.  General:  obese female HEENT: normal Lymph: no adenopathy Neck: no JVD Endocrine:  No thryomegaly Vascular: No carotid bruits; FA pulses 2+ bilaterally without bruits  Cardiac:  normal S1, S2; RRR; no murmur  Lungs:  respirations unlabored  Abd: soft, nontender, no hepatomegaly  Ext: no edema Musculoskeletal:  No deformities, BUE and BLE strength normal and equal Skin: warm and dry  Neuro:  CNs 2-12 intact, no focal abnormalities noted Psych:  Normal affect   EKG:  The EKG was personally reviewed and demonstrates:  sinus rhythm HR 69, LBBB Telemetry:  Telemetry was personally reviewed and demonstrates:  sinus rhythm in the 80s  Relevant CV Studies:  Cath: 02/20/21   Mid RCA to Dist RCA lesion is 100% stenosed. Ost LAD to Prox LAD lesion is 50% stenosed. Prox LAD-1 lesion is 100% stenosed. Prox LAD-2 lesion is 100% stenosed. Ost Cx to Prox Cx lesion is 60% stenosed -focal eccentric heavily calcified lesion -> negative RFR (0.96) 1st Mrg lesion is 40% stenosed. 2nd Mrg lesion is 80% stenosed - focal SVG-RCA graft was visualized by angiography and is large. The graft exhibits minimal luminal irregularities. SCG-RCA: Mid Graft to Dist Graft lesion is 40% stenosed. LIMA graft was not visualized due to inability to cannulate. ------CULPRIT LESIONS - SVG-DIAG ISR & Native Diag 95% ------- SVG-Diag: Prox Graft to Mid Graft Stent is  10% stenosed. SVG-Diag: Dist Graft to Insertion Stent is 90% stenosed. Scoring balloon angioplasty was performed using a BALLOON SCOREFLEX 2.50X10. Followed by 2.75 mm [place] balloon post dilation Post intervention, there is a 25% residual  stenosis. Ost 1st Diag Stent (from Graft into native) is 80% re-stenosed. Native 1st Diag (immediately after the sten) is 95% stenosed. A drug-eluting stent was successfully placed overlapping from the distal stent edge crossing the lesion, using a STENT ONYX FRONTIER 2.25X12. -> Postdilated at the overlap segment to 2.6 mm, and distally to 2.4 mm -------------------------------------------- Post intervention, there is a 15% residual stenosis at the 80% site, and 0% residual stenosis at the 95% site.. LV end diastolic pressure is moderate-severely elevated.   SUMMARY: Stable findings Stable Severe native CAD with chronic total occlusion of LAD and RCA LCx calcified 60% ostial lesion - RFR negative (0.96); PCI deferred. LIMA to LAD not visualized as only access was right radial SVG to RCA 40% - confirmed SVG to D2 recurrent high-grade 90% ISR with 95% lesion in D2 after graft insertion Successful DES PCI of 95% lesion in D2 after graft insertion and recent stent with Onyx Frontier 2.25 mm x 12 mm postdilated to 2.5 mm distally and 2.75 mm at the overlap. Borderline successful PTCA of in-stent restenosis in the body of graft. ->  2.5 mm scoring balloon followed by post dilation with a 2.75 mm [place] balloon     RECOMMENDATIONS Overnight observation on 6 E. post procedure unit. ->  Difficult TR band management. She will restart Eliquis tonight, and continue with clopidogrel without aspirin. If stable, anticipate potential discharge tomorrow After discussion with the patient's close family member, would consider discussing possibility of home health nurse/physical therapy to assist during the patient's recovery.   [name], MD   Diagnostic Dominance: Right      Intervention      _____________  Laboratory Data:  High Sensitivity Troponin:   Recent Labs  Lab 02/19/21 1524 03/21/21 1223  TROPONINIHS 42* 26*     Chemistry Recent Labs  Lab 03/21/21 1223  NA 138  K 3.5  CL  100  CO2 28  GLUCOSE 219*  BUN 10  CREATININE 0.96  CALCIUM 8.8*  GFRNONAA >60  ANIONGAP 10    No results for input(s): PROT, ALBUMIN, AST, ALT, ALKPHOS, BILITOT in the last 168 hours. Hematology Recent Labs  Lab 03/21/21 1223  WBC 9.2  RBC 3.41*  HGB 10.5*  HCT 32.9*  MCV 96.5  MCH 30.8  MCHC 31.9  RDW 15.9*  PLT 207   BNPNo results for input(s): BNP, PROBNP in the last 168 hours.  DDimer No results for input(s): DDIMER in the last 168 hours.   Radiology/Studies:  DG Chest Port 1 View  Result Date: 03/21/2021 CLINICAL DATA:  Chest pain and shortness of breath EXAM: PORTABLE CHEST 1 VIEW COMPARISON:  02/19/2021 FINDINGS: Cardiomegaly. CABG and coronary/venous graft stenting. Interstitial coarsening similar to priors. No Kerley lines or definite effusion. No air bronchogram. IMPRESSION: Chronic cardiomegaly and interstitial coarsening. No acute finding when compared to priors. Electronically Signed   By: Monte Fantasia M.D.   On: 03/21/2021 10:14     Assessment and Plan:   Chest pain - pt describes fever, chills, body aches, productive cough, congestion, fatigue, and pleuritic pain in her left lung fields - COVID/PNA pending - chest pain described as pleuritic and worse with cough, but also present without cough - CXR does not show acute disease - will add sed  rate and CRP to evaluate for pericarditis in the setting of suspected viral infection   CAD s/p CABG and subsequent PCI Recent DES to SVG-D2 and PTCA of ISR - maintained on plavix, no ASA given eliquis - hs troponin 26 - similar to CE elevation during recent PCI - medical therapy has included: 100 mg lopressor BID and 60 mg imdur, statin - do not suspect chest pain is cardiac in nature, although likely has a low threshold for demand ischemia - awaiting delta troponin - will likely defer ischemic evaluation unless delta troponin results significantly elevated - she last took eliquis this morning - consider  lovenox or heparin gtt if angiography possible this admission   Paroxysmal atrial fibrillation Chronic anticoagulation - maintained on high dose BB and tikosyn - anticoagulated with eliquis - no bleeding - EKG with sinus rhythm - PNR cardizem   Hypertension - hypertensive in the room - restart home medications   Hyperlipidemia with LDL goal < 70 02/20/2021: Cholesterol 99; HDL 51; LDL Cholesterol 15; Triglycerides 165; VLDL 33 - continue home regimen   Hx of renal transplant - per primary - will avoid contrast if possible   COPD DM - per primary    Risk Assessment/Risk Scores:   HEAR Score (for undifferentiated chest pain):  HEAR Score: 5{     CHA2DS2-VASc Score = 5 This indicates a 7.2% annual risk of stroke. The patient's score is based upon: CHF History: Yes HTN History: Yes Diabetes History: Yes Stroke History: No Vascular Disease History: Yes Age Score: 0 Gender Score: 1       For questions or updates, please contact [place] Please consult [email or web address] for contact info under    Signed, Ledora Bottcher, [place]  03/21/2021 3:19 PM

## 2021-03-21 NOTE — H&P (Signed)
History and Physical    Katherine [name] IEP:[id number] DOB: Oct 09, 1955 DOA: 03/21/2021  PCP: [name], MD  Patient coming from: Home  I have personally briefly reviewed patient's old medical records in [place]  Chief Complaint: CP  HPI: Katherine [name] is a 65 y.o. female with medical history significant of DM2, HTN, HLD, COPD, renal transplant in 1991, A.Fib on eliquis, CAD s/p CABG 1995, multiple PCIs since 2001, most recent PCI with DES to SVG-D1 just last month.  Pt with recent [facility] 01/31/21 for CP showing severe native CAD with CTO of LAD and RCA, new 60% stenosis in the L Cx, and SVG-D2 with recurrent high grade 90% ISR with 95% lesion in the D2 after graft insertion. Medical therapy was pursued with plans for possible intervention on ostial L Cx and repeat PCI of SVG-D2 if she fails medical therapy. She was brought back to the lab 02/20/21. RFR negative in L Cx, PCI deferred. DES placed to 95% Iesion in D2 after graft insertion, borderline successful PTCA of ISR in the graft.  Presents to ED today with c/o productive cough, congestion, fatigue, fever, chills, body aches, and left sided CP.  Associated poor PO intake the past 2 days.  CP is sharp, worse with coughing, L sided, does not feel like prior angina.  No abd pain.   ED Course: Tm 99.8.  WBC nl, trops 26 and 23.  CXR neg, COVID and flu neg.  CRP 3.7, ESR 56.  Cards evaluated pt, thinks coronary CP less likely.   Review of Systems: As per HPI, otherwise all review of systems negative.  Past Medical History:  Diagnosis Date   Anemia    Anxiety    Bilateral carotid artery stenosis    Carotid duplex 01/6062: 0-16% LICA, 01-09% RICA, >32% RECA, f/u 1 yr suggested   CAD (coronary artery disease) of bypass graft 5/01; 3/'02, 8/'03, 10/'04; 1/15   PCI x 5 to SVG-D1    CAD in native artery 07/1993   3 Vessel Disease (LAD-D1 & RCA) -- CABG (Dx in setting of inferior STEMI-PTCA of RCA)   CAD S/P percutaneous  coronary angioplasty    PCI to SVG-D1 insertion/native D1 x 4 = '01 -(S660 BMS 2.5 x 9 anastomosis- D1); '02 - distal overlap ACS Pixel 2.5 x 8  BMS; '03 distal/native ISR/Thrombosis - Pixel 2.5 x 13; '04 - ISR-  Taxus 2.5 x 20 (covered all);; 1/15 - mid SVG-D1 (50% distal ISR) - Promus P 2.75 x 20 -- 2.8 mm   COPD mixed type (HCC)    Followed by Dr. Lamonte Sakai "pulmonologist said no COPD"   Depression    Depression with anxiety    Diabetes mellitus type 2 in obese ([place])    Diarrhea    started after cholecystectomy and mass removed from intestine   Dyslipidemia, goal LDL below 70    08/2012: TC 137, TG 200, HDL 32!, LDL 45; on statin (followed by Dr.Deterding)   ESRD (end stage renal disease) ([place]) 1991   s/p [facility] - Dr. Jimmy Footman)    Family history of adverse reaction to anesthesia    mom's bp dropped during/after anesthesia   Fibromyalgia    GERD (gastroesophageal reflux disease)    Glomerulonephritis, chronic, rapidly progressive 1989   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 07/1993   Rescue PTCA of RCA -- referred for CABG.   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 Servando Snare: LIMA-LAD, SVG-bifurcatingD1, SVG-rPDA   History of kidney stones    History of stroke 2012   "right eye stroke- half blind now"   Hypertension associated with diabetes ([place])    Mild aortic stenosis by prior echocardiogram 07/2019   Echo:  Mild aortic stenosis (gradients: Mean 14.3 mmHg -peak 24.9 mmHg).   Morbid obesity (HCC)    MRSA (methicillin resistant staph aureus) culture positive    OSA (obstructive sleep apnea)    no longer on CPAP or home O2, states she doesn't need now after lap band   PAD (peripheral artery disease) ([place]) 08/2013   LEA Dopplers to be read by Dr. Fletcher Anon   PAF (paroxysmal atrial fibrillation) ([place]) 06/2014   Noted on CardioNet Monitor  - --> rhythm control with Tikosyn (Dr. Rayann Heman);  converted from warfarin to apixaban for anticoagulation.   Pneumonia    Recurrent boils    Bilateral Groin   Rheumatoid arthritis ([place])    Per Patient Report; associated with OA   S/p cadaver renal transplant 1991   [facility]   Unstable angina ([place]) 5/01; 3/'02, 8/'03, 10/'04; 1/15   x 5 occurences since Inf-Post STEMI in 1994    Past Surgical History:  Procedure Laterality Date   ABDOMINAL AORTOGRAM N/A 04/21/2018   Procedure: ABDOMINAL AORTOGRAM;  Surgeon: [name], MD;  Location: [place] CV LAB;  Service: Cardiovascular;  Laterality: N/A;   CATHETER REMOVAL     CHOLECYSTECTOMY N/A 10/29/2014   Procedure: LAPAROSCOPIC CHOLECYSTECTOMY WITH INTRAOPERATIVE CHOLANGIOGRAM;  Surgeon: Excell Seltzer, MD;  Location: [facility];  Service: General;  Laterality: N/A;   CORONARY ANGIOPLASTY  1994   x5   CORONARY ARTERY BYPASS GRAFT  1995   LIMA-LAD, SVG-RPDA, SVG-D1   CORONARY STENT INTERVENTION N/A 02/20/2021   Procedure: CORONARY STENT INTERVENTION;  Surgeon: [name], MD;  Location: [place] [facility];  Service: Cardiovascular;  Laterality: N/A;   ESOPHAGOGASTRODUODENOSCOPY N/A 10/15/2016   Procedure: ESOPHAGOGASTRODUODENOSCOPY (EGD);  Surgeon: Wilford Corner, MD;  Location: [facility] ENDOSCOPY;  Service: Endoscopy;  Laterality: N/A;   I & D EXTREMITY Right 01/29/2018   Procedure: IRRIGATION AND DEBRIDEMENT THUMB;  Surgeon: Dayna Barker, MD;  Location: [place];  Service: Plastics;  Laterality: Right;   INCISE AND DRAIN ABCESS     INTRAVASCULAR PRESSURE WIRE/FFR STUDY N/A 02/20/2021   Procedure: INTRAVASCULAR PRESSURE WIRE/FFR STUDY;  Surgeon: [name], MD;  Location: [place] [facility] LAB;  Service: Cardiovascular;  Laterality: N/A;   KIDNEY TRANSPLANT  1991   KNEE ARTHROSCOPY WITH LATERAL MENISECTOMY Left 12/03/2017   Procedure: LEFT KNEE ARTHROSCOPY WITH LATERAL MENISECTOMY;  Surgeon: Earlie Server, MD;  Location: [place];  Service: Orthopedics;  Laterality: Left;   LAPAROSCOPIC  GASTRIC BANDING  04/2004; 10/'09, 2/'10   Port Replacement x 2   LEFT HEART CATH AND CORONARY ANGIOGRAPHY N/A 02/20/2021   Procedure: LEFT HEART CATH AND CORONARY ANGIOGRAPHY;  Surgeon: [name], MD;  Location: [place] [facility];  Service: Cardiovascular;  Laterality: N/A;   LEFT HEART CATH AND CORS/GRAFTS ANGIOGRAPHY N/A 04/21/2018   Procedure: LEFT HEART CATH AND CORS/GRAFTS ANGIOGRAPHY;  Surgeon: [name], MD;  Location: [place] CV LAB;  Ost-Prox LAD 50% - proxLAD (pre & post D1) 100% CTO. Cx - patent, small OM1 (stable ~ ostial OM1 90%, too small for PCI) & 2 small LPL; Ost-distal RCA 100% CTO.  LIMA-LAD (not injected);  SVG-dRCA patent, SVG-D1 - insertion stent ~20% ISR - Severe R CFA disease w/ focal Sub TO   LEFT HEART CATH AND CORS/GRAFTS ANGIOGRAPHY  5/'01, 3/'02, 8/'03, 10/'04; 1/'15   08/22/2013: LAD & RCA 100%; LIMA-LAD & SVG-rPDA patent; Cx-- OM1 60%, OM2 ostial ~50%; SVG-D1 - 80% mid, 50% distal ISR --PCI   LEFT HEART CATH AND CORS/GRAFTS ANGIOGRAPHY N/A 01/31/2021   Procedure: LEFT HEART CATH AND CORS/GRAFTS ANGIOGRAPHY;  Surgeon: Jolaine Artist, MD;  Location: [place] [facility];  Service: Cardiovascular;  Laterality: N/A;   LEFT HEART CATHETERIZATION WITH CORONARY/GRAFT ANGIOGRAM N/A 08/23/2013   Procedure: LEFT HEART CATHETERIZATION WITH Beatrix Fetters;  Surgeon: Wellington Hampshire, MD;  Location: [place] CATH LAB;  Service: Cardiovascular;  Laterality: N/A;   Lower Extremity Arterial Dopplers  08/2013   ABI: R 0.96, L 1.04   MULTIPLE TOOTH EXTRACTIONS  age 34   NM MYOVIEW LTD  03/2016   EF 62%. LOW RISK. C/W prior MI - no Ischemia. Apical hypokinesis.   PERCUTANEOUS CORONARY STENT INTERVENTION (PCI-S)  5/'01, 3/'02, 8/'03, 10/'04;   '01 - S660 BMS 2.5 x 9 - dSVG-D1 into D1; '02- post-stent stenosis - 2.5 x 8 Pixel BMS; '8\03: ISR/Thrombosis into native D1 - AngioJet, 2.5 x 13 Pixel; '04 - ISR 95% - covered stented area with Taxus DES 2.5 mm x 20 (2.88)    PERCUTANEOUS CORONARY STENT INTERVENTION (PCI-S)  08/23/2013   Procedure: PERCUTANEOUS CORONARY STENT INTERVENTION (PCI-S);  Surgeon: Wellington Hampshire, MD;  Location: [facility] CATH LAB;  Service: Cardiovascular;;mid SVG-D1 80%; distal stent ~50% ISR; Promus Prermier DES 2.75 mm xc 20 mm (2.8 mm)   PORT-A-CATH REMOVAL     kidney   TRANSTHORACIC ECHOCARDIOGRAM  07/2019   EF 55 to 60%.  No LVH.  Paradoxical septal wall motion-post CABG.  GRII DD.  Normal RV size and function.  Mild bilateral atrial dilation.  Moderate MAC.  Trace MR.  Mild aortic stenosis (gradients: Mean 14.3 mmHg -peak 24.9 mmHg).   TUBAL LIGATION     wrist fistula repair Left    dialysis for one year     reports that she quit smoking about 18 years ago. Her smoking use included cigarettes. She has a 30.00 pack-year smoking history. She has never used smokeless tobacco. She reports that she does not drink alcohol and does not use drugs.  Allergies  Allergen Reactions   Tetracycline Hives    Patient tolerated Doxycycline Dec 2020   Niacin Other (See Comments)    Mouth blisters   Niaspan [Niacin Er] Other (See Comments)    Mouth blisters   Sulfa Antibiotics Nausea Only and Other (See Comments)    "Tears up stomach"   Sulfonamide Derivatives Other (See Comments)    Reaction: per patient "tears her stomach up"   Codeine Nausea And Vomiting   Erythromycin Nausea And Vomiting   Hydromorphone Hcl Nausea And Vomiting   Morphine And Related Nausea And Vomiting   Nalbuphine Nausea And Vomiting    Nubain   Sulfasalazine Nausea Only and Other (See Comments)    per patient "tears her stomach up", "Tears up stomach"   Tape Rash and Other (See Comments)    No "plastic" tape," please----cloth tape only    Family History  Problem Relation Age of Onset   Cancer Mother        liver   Heart disease Father    Cancer Father        colon   Arrhythmia Brother  Atrial Fibrillation   Arrhythmia Paternal Aunt        Atrial  Fibrillation     Prior to Admission medications   Medication Sig Start Date End Date Taking? Authorizing Provider  LORazepam (ATIVAN) 1 MG tablet Take 1 tablet (1 mg total) by mouth 2 (two) times daily. 03/20/21  Yes [name], MD  acetaminophen (TYLENOL) 500 MG tablet Take 1,500 mg by mouth daily.    [provider]  albuterol (VENTOLIN HFA) 108 (90 Base) MCG/ACT inhaler TAKE 2 PUFFS BY MOUTH EVERY 6 HOURS AS NEEDED FOR WHEEZE OR SHORTNESS OF BREATH Patient taking differently: Inhale 2 puffs into the lungs every 6 (six) hours as needed for wheezing or shortness of breath. 02/10/21   Byrum, Rose Fillers, MD  amoxicillin (AMOXIL) 250 MG capsule Take 1 capsule (250 mg total) by mouth 3 (three) times daily. 03/12/21   Meredith Staggers, MD  arformoterol (BROVANA) 15 MCG/2ML NEBU Take 2 mLs (15 mcg total) by nebulization 2 (two) times daily. 08/30/19   Collene Gobble, MD  azaTHIOprine (IMURAN) 50 MG tablet Take 125 mg by mouth daily.     [provider]  B-D ULTRAFINE III SHORT PEN 31G X 8 MM MISC See admin instructions. 11/06/19   [provider]  Blood Glucose Monitoring Suppl (ONE TOUCH ULTRA 2) w/Device KIT 2 (two) times daily. 04/08/20   [provider]  budesonide (PULMICORT) 0.5 MG/2ML nebulizer solution Take 2 mLs (0.5 mg total) by nebulization 2 (two) times daily. 08/24/19   [name], MD  calcitRIOL (ROCALTROL) 0.25 MCG capsule Take 0.25 mcg by mouth every 3 (three) days.     [provider]  cetirizine (ZYRTEC) 10 MG tablet Take 10 mg by mouth daily.    [provider]  clopidogrel (PLAVIX) 75 MG tablet Take 1 tablet (75 mg total) by mouth daily. 02/22/21   Cheryln Manly, NP  clotrimazole (MYCELEX) 10 MG troche Take 1 tablet (10 mg total) by mouth 5 (five) times daily as needed (thrush). 02/03/21   Danford, Suann Larry, MD  cyclobenzaprine (FLEXERIL) 10 MG tablet TAKE 1 TO 2 TABS BY MOUTH DAILY. TAKE 2 TAB DAILY AT BEDTIME, MAY ALSO  TAKE 1 TABLET BY MOUTH AT NOON AS NEEDED FOR MUSCLE SPASMS Patient taking differently: Take 10-20 mg by mouth See admin instructions. Take 2 tablets (20 mg) by mouth daily at bedtime, may also take 1 tablet (10 mg) daily at noon as needed for muscle spasms 01/30/21   Meredith Staggers, MD  diltiazem (CARDIZEM) 30 MG tablet take 1 tablet every 4 hours AS NEEDED for afib heart rate >100 as long as top blood pressure >100. Patient taking differently: Take 30 mg by mouth See admin instructions. Take one tablet (30 mg) by mouth every 4 hours AS NEEDED for afib heart rate >100 as long as top blood pressure >100 03/04/21   Sherran Needs, NP  dofetilide (TIKOSYN) 250 MCG capsule Take 1 capsule (250 mcg total) by mouth 2 (two) times daily. 03/04/21   Sherran Needs, NP  DULoxetine (CYMBALTA) 30 MG capsule TAKE 2 CAPSULES (60 MG TOTAL) BY MOUTH EVERY MORNING. Patient taking differently: Take 60 mg by mouth every morning. 10/30/20   Bayard Hugger, NP  ELIQUIS 5 MG TABS tablet TAKE 1 TABLET BY MOUTH TWICE A DAY (TO REPLACE WARFARIN) Patient taking differently: Take 5 mg by mouth 2 (two) times daily. To replace warfarin 10/02/20   [name],  MD  fluticasone (FLONASE) 50 MCG/ACT nasal spray PLACE 2 SPRAYS INTO BOTH NOSTRILS 2 TIMES DAILY Patient taking differently: Place 2 sprays into both nostrils 2 (two) times daily. 12/17/20   Collene Gobble, MD  furosemide (LASIX) 40 MG tablet Take 1 tablet (40 mg total) by mouth daily. 07/14/20   Dwyane Dee, MD  glimepiride (AMARYL) 4 MG tablet Take 4 mg by mouth daily. 02/03/20   [provider]  insulin aspart (NOVOLOG FLEXPEN) 100 UNIT/ML FlexPen Inject 14 Units into the skin 3 (three) times daily with meals.    [provider]  Insulin Glargine (BASAGLAR KWIKPEN) 100 UNIT/ML SOPN Inject 15 Units into the skin daily before breakfast.    [provider]  isosorbide mononitrate (IMDUR) 60 MG 24 hr tablet Take 1 tablet (60 mg total) by  mouth daily. 02/04/21   Edwin Dada, MD  lamoTRIgine (LAMICTAL) 25 MG tablet TAKE 1 TABLET BY MOUTH EVERYDAY AT BEDTIME Patient taking differently: No sig reported 03/05/21   Meredith Staggers, MD  Lancets [facility] DELICA PLUS [id number]) MISC daily. as directed 03/26/20   [provider]  metFORMIN (GLUCOPHAGE) 500 MG tablet Take 1 tablet (500 mg total) by mouth 2 (two) times daily. 02/22/21   Burnell Blanks, MD  metoprolol tartrate (LOPRESSOR) 100 MG tablet Take 1 tablet (100 mg total) by mouth 2 (two) times daily. Appointment Required For Further Refills [phone] 02/03/21   Edwin Dada, MD  montelukast (SINGULAIR) 10 MG tablet TAKE 1 TABLET BY MOUTH EVERYDAY AT BEDTIME Patient taking differently: Take 10 mg by mouth at bedtime. 02/10/21   Collene Gobble, MD  nitroGLYCERIN (NITROSTAT) 0.4 MG SL tablet Place 1 tablet (0.4 mg total) under the tongue every 5 (five) minutes as needed for chest pain. 02/21/21   Burnell Blanks, MD  ONETOUCH ULTRA test strip USE AS DIRECTED 3 TIMES DAILY TO CHECK BLOOD SUGAR 11/04/19   [provider]  Oxycodone HCl 10 MG TABS Take 1 tablet (10 mg total) by mouth 3 (three) times daily as needed. 03/12/21   Meredith Staggers, MD  OXYGEN Inhale 3 L into the lungs continuous.    [provider]  pantoprazole (PROTONIX) 40 MG tablet Take 1 tablet (40 mg total) by mouth daily. 02/21/21 05/22/21  Cheryln Manly, NP  predniSONE (DELTASONE) 5 MG tablet Take 5 mg by mouth at bedtime. 06/19/19   [provider]  promethazine (PHENERGAN) 25 MG tablet Take 1 tablet (25 mg total) by mouth at bedtime as needed for nausea. For nausea and sleep 07/14/20   Dwyane Dee, MD  rosuvastatin (CRESTOR) 20 MG tablet Take 20 mg by mouth daily.    [provider]  rosuvastatin (CRESTOR) 40 MG tablet Take 1 tablet (40 mg total) by mouth daily. 02/03/21   Danford, Suann Larry, MD  sertraline (ZOLOFT) 100 MG tablet  Take 200 mg by mouth at bedtime. 06/12/20   [provider]    Physical Exam: Vitals:   03/21/21 1915 03/21/21 1930 03/21/21 1945 03/21/21 2000  BP: (!) 122/58 137/63 (!) 98/53 123/69  Pulse: 73 73 72 73  Resp: _0 Temp:      TempSrc:      SpO2: 99% 98% 99% 98%  Weight:      Height:        Constitutional: NAD, calm, mildly ill appearing Eyes: PERRL, lids and conjunctivae normal ENMT: Mucous membranes are moist. Posterior pharynx clear of any exudate  or lesions.Normal dentition.  Neck: normal, supple, no masses, no thyromegaly Respiratory: clear to auscultation bilaterally, no wheezing, no crackles. Normal respiratory effort. No accessory muscle use.  Cardiovascular: Regular rate and rhythm, no murmurs / rubs / gallops. No extremity edema. 2+ pedal pulses. No carotid bruits.  Abdomen: no tenderness, no masses palpated. No hepatosplenomegaly. Bowel sounds positive.  Musculoskeletal: no clubbing / cyanosis. No joint deformity upper and lower extremities. Good ROM, no contractures. Normal muscle tone.  Skin: no rashes, lesions, ulcers. No induration Neurologic: CN 2-12 grossly intact. Sensation intact, DTR normal. Strength 5/5 in all 4.  Psychiatric: Normal judgment and insight. Alert and oriented x 3. Normal mood.    Labs on Admission: I have personally reviewed following labs and imaging studies  CBC: Recent Labs  Lab 03/21/21 1223  WBC 9.2  HGB 10.5*  HCT 32.9*  MCV 96.5  PLT 591   Basic Metabolic Panel: Recent Labs  Lab 03/21/21 1223  NA 138  K 3.5  CL 100  CO2 28  GLUCOSE 219*  BUN 10  CREATININE 0.96  CALCIUM 8.8*   GFR: Estimated Creatinine Clearance: 64.1 mL/min (by C-G formula based on SCr of 0.96 mg/dL). Liver Function Tests: No results for input(s): AST, ALT, ALKPHOS, BILITOT, PROT, ALBUMIN in the last 168 hours. No results for input(s): LIPASE, AMYLASE in the last 168 hours. No results for input(s): AMMONIA in the last 168  hours. Coagulation Profile: No results for input(s): INR, PROTIME in the last 168 hours. Cardiac Enzymes: No results for input(s): CKTOTAL, CKMB, CKMBINDEX, TROPONINI in the last 168 hours. BNP (last 3 results) No results for input(s): PROBNP in the last 8760 hours. HbA1C: No results for input(s): HGBA1C in the last 72 hours. CBG: No results for input(s): GLUCAP in the last 168 hours. Lipid Profile: No results for input(s): CHOL, HDL, LDLCALC, TRIG, CHOLHDL, LDLDIRECT in the last 72 hours. Thyroid Function Tests: No results for input(s): TSH, T4TOTAL, FREET4, T3FREE, THYROIDAB in the last 72 hours. Anemia Panel: No results for input(s): VITAMINB12, FOLATE, FERRITIN, TIBC, IRON, RETICCTPCT in the last 72 hours. Urine analysis:    Component Value Date/Time   COLORURINE YELLOW 10/30/2020 1759   APPEARANCEUR CLEAR 10/30/2020 1759   LABSPEC 1.009 10/30/2020 1759   PHURINE 7.0 10/30/2020 1759   GLUCOSEU 50 (A) 10/30/2020 1759   GLUCOSEU 100 (A) 08/13/2020 1550   HGBUR NEGATIVE 10/30/2020 1759   BILIRUBINUR NEGATIVE 10/30/2020 1759   KETONESUR NEGATIVE 10/30/2020 1759   PROTEINUR NEGATIVE 10/30/2020 1759   UROBILINOGEN 0.2 08/13/2020 1550   NITRITE NEGATIVE 10/30/2020 1759   LEUKOCYTESUR NEGATIVE 10/30/2020 1759    Radiological Exams on Admission: DG Chest Port 1 View  Result Date: 03/21/2021 CLINICAL DATA:  Chest pain and shortness of breath EXAM: PORTABLE CHEST 1 VIEW COMPARISON:  02/19/2021 FINDINGS: Cardiomegaly. CABG and coronary/venous graft stenting. Interstitial coarsening similar to priors. No Kerley lines or definite effusion. No air bronchogram. IMPRESSION: Chronic cardiomegaly and interstitial coarsening. No acute finding when compared to priors. Electronically Signed   By: Monte Fantasia M.D.   On: 03/21/2021 10:14    EKG: Independently reviewed. New LBBB  Assessment/Plan Principal Problem:   Chest pain Active Problems:   Hypertension associated with diabetes  (HCC)   Paroxysmal atrial fibrillation ([place]); CHA2DS2VASc score F, HTN, CAD, CVA = 5   Type 2 diabetes mellitus with peripheral neuropathy (HCC)   Shortness of breath   LBBB (left bundle branch block)   Renal transplant, status post   Chronic  bronchitis, mucopurulent (HCC)   CAD in native artery   Chronic respiratory failure with hypoxia (HCC)   Viral syndrome    CP - sounds more pleuritic, with associated viral syndrome COVID / Flu neg CXR neg ESR and CRP slightly elevated Check RVP Pt already on prednisone 5 QHS chronically Defer decision to start NSAIDs / colchicine for possible pericarditis to cards. CAD - S/p CABG, most recently had PCI just a couple of weeks ago with DES CP today felt unlikely to represent cardiac angina (see cards consult note) Tele monitor Trops 26 and 23 Cont plavix + Eliquis Cont lopressor, imdur, statin Pt needs script for imdur Sounds like invasive w/u is not planned at the moment per Dr. Blenda Mounts note: Cont eliquis Letting pt eat PAF - Cont eliquis Cont high dose BB Cont tikosyn PRN cardizem PO, will hold this for the moment (use short acting if needed) DM2 - Cont lantus Hold home PO hypoglycemics Mod scale SSI AC 4u novolog TID AC HTN - Cont home BP meds HLD - Cont crestor H/o Renal transplant - Cont imuran Cont prednisone Chronic resp failure with hypoxia - Cont home nebs Cont O2 via [place] Chronic arthritis pain - Cont home pain meds Will go ahead and order X ray of pelvis + L spine New LBBB  DVT prophylaxis: Eliquis Code Status: Full Family Communication: Niece on phone Disposition Plan: Home after clinically improved and cards signs off Consults called: Cards Admission status: Place in 93     Arnet Hofferber, [place] Hospitalists  How to contact the [facility] Attending or Consulting provider 7A - 7P or covering provider during after hours 7P -7A, for this patient?  Check the care team in [facility] and look for a)  attending/consulting [facility] provider listed and b) the [facility] team listed Log into [email or web address]  Amion Physician Scheduling and messaging for groups and whole hospitals  On call and physician scheduling software for group practices, residents, hospitalists and other medical providers for call, clinic, rotation and shift schedules. On[employer] is a hospital-wide system for scheduling doctors and paging doctors on call. EasyPlot is for scientific plotting and data analysis.  [email or web address]  and use [place]'s universal password to access. If you do not have the password, please contact the hospital operator.  Locate the [facility] provider you are looking for under [facility] and page to a number that you can be directly reached. If you still have difficulty reaching the provider, please page the [facility] (Director on Call) for the Hospitalists listed on amion for assistance.  03/21/2021, 8:35 PM

## 2021-03-21 NOTE — ED Provider Notes (Signed)
[facility] EMERGENCY DEPARTMENT Provider Note   CSN: [id number] Arrival date & time: 03/21/21  0920     History Chief Complaint  Patient presents with   Chest Pain    Pt arrived via [facility] with C/C of Chest pain, shOB. CP central when she takes deep breath, not eating or drinking normal due to no energy. Pt hx of kidney transplant ibn 91, but complaints of back pain.   104 (P), 96% [place] _0 , CBG [place] is a 65 y.o. female.  Patient is a 65 year old female with a history of extensive coronary artery disease status post bypass surgery and multiple PCI's.  She also has diabetes, hypertension, hyperlipidemia and atrial fibrillation on Eliquis.  She was recently admitted on July 6 for chest pain and underwent cardiac catheterization on July 7.  She was found to have severe coronary artery disease.  She had a stent placed in the D2 branch and PTCA of in-stent restenosis.  She was started on Plavix in addition to her chronic Eliquis.  She comes in today for chest pain.  She says it started this morning.  It is across her chest.  It is a severe tightness but gets sharp at times.  She feels like she is having chills.  She has a cough which is productive of some yellow sputum.  She is concerned that she may have pneumonia.  She says it feels different than her normal chest pain that was associated with her heart.  Of note, she is status post kidney transplant.  She has not checked her temperature at home.   HPI: A 65 year old patient with a history of peripheral artery disease, treated diabetes, hypertension, hypercholesterolemia and obesity presents for evaluation of chest pain. Initial onset of pain was more than 6 hours ago. The patient's chest pain is described as heaviness/pressure/tightness and is not worse with exertion. The patient's chest pain is middle- or left-sided, is not well-localized, is not sharp and does not radiate to the arms/jaw/neck. The patient  does not complain of nausea and denies diaphoresis. The patient has no history of stroke, has not smoked in the past 90 days and has no relevant family history of coronary artery disease (first degree relative at less than age 53).   Past Medical History:  Diagnosis Date   Anemia    Anxiety    Bilateral carotid artery stenosis    Carotid duplex 09/6946: 5-46% LICA, 27-03% RICA, >50% RECA, f/u 1 yr suggested   CAD (coronary artery disease) of bypass graft 5/01; 3/'02, 8/'03, 10/'04; 1/15   PCI x 5 to SVG-D1    CAD in native artery 07/1993   3 Vessel Disease (LAD-D1 & RCA) -- CABG (Dx in setting of inferior STEMI-PTCA of RCA)   CAD S/P percutaneous coronary angioplasty    PCI to SVG-D1 insertion/native D1 x 4 = '01 -(S660 BMS 2.5 x 9 anastomosis- D1); '02 - distal overlap ACS Pixel 2.5 x 8  BMS; '03 distal/native ISR/Thrombosis - Pixel 2.5 x 13; '04 - ISR-  Taxus 2.5 x 20 (covered all);; 1/15 - mid SVG-D1 (50% distal ISR) - Promus P 2.75 x 20 -- 2.8 mm   COPD mixed type (HCC)    Followed by Dr. Lamonte Sakai "pulmonologist said no COPD"   Depression    Depression with anxiety    Diabetes mellitus type 2 in obese ([place])    Diarrhea    started after cholecystectomy and mass removed from  intestine   Dyslipidemia, goal LDL below 70    08/2012: TC 137, TG 200, HDL 32!, LDL 45; on statin (followed by Dr.Deterding)   ESRD (end stage renal disease) ([place]) 1991   s/p [facility] - Dr. Jimmy Footman)    Family history of adverse reaction to anesthesia    mom's bp dropped during/after anesthesia   Fibromyalgia    GERD (gastroesophageal reflux disease)    Glomerulonephritis, chronic, rapidly progressive 1989   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 07/1993   Rescue PTCA of RCA -- referred for CABG.   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 Servando Snare: LIMA-LAD, SVG-bifurcatingD1, SVG-rPDA   History of kidney  stones    History of stroke 2012   "right eye stroke- half blind now"   Hypertension associated with diabetes ([place])    Mild aortic stenosis by prior echocardiogram 07/2019   Echo:  Mild aortic stenosis (gradients: Mean 14.3 mmHg -peak 24.9 mmHg).   Morbid obesity (HCC)    MRSA (methicillin resistant staph aureus) culture positive    OSA (obstructive sleep apnea)    no longer on CPAP or home O2, states she doesn't need now after lap band   PAD (peripheral artery disease) ([place]) 08/2013   LEA Dopplers to be read by Dr. Fletcher Anon   PAF (paroxysmal atrial fibrillation) ([place]) 06/2014   Noted on CardioNet Monitor  - --> rhythm control with Tikosyn (Dr. Rayann Heman); converted from warfarin to apixaban for anticoagulation.   Pneumonia    Recurrent boils    Bilateral Groin   Rheumatoid arthritis ([place])    Per Patient Report; associated with OA   S/p cadaver renal transplant 1991   [facility]   Unstable angina ([place]) 5/01; 3/'02, 8/'03, 10/'04; 1/15   x 5 occurences since Inf-Post STEMI in 1994    Patient Active Problem List   Diagnosis Date Noted   Coronary stent restenosis due to scar tissue    Unstable angina ([place]) 02/19/2021   Acute on chronic diastolic CHF (congestive heart failure) ([place]) 01/29/2021   Acute CHF (congestive heart failure) ([place]) 01/29/2021   Acute on chronic systolic CHF (congestive heart failure) ([place]) 01/29/2021   Non-ST elevation (NSTEMI) myocardial infarction [facility])    Chronic respiratory failure with hypoxia ([place]) 01/01/2021   Flu vaccine need 08/13/2020   Need for pneumococcal vaccination 08/13/2020   Visit for screening mammogram 08/13/2020   Candidal skin infection 08/13/2020   GAD (generalized anxiety disorder) 08/13/2020   Chronic bronchitis, mucopurulent ([place]) 08/24/2019   Current chronic use of systemic steroids 07/23/2019   OSA (obstructive sleep apnea)    Severe episode of recurrent major depressive disorder, with psychotic features ([place]) 02/16/2019   Obstructive  chronic bronchitis without exacerbation ([place]) 11/03/2018   Long term (current) use of anticoagulants 04/28/2018   Renal transplant, status post 01/28/2018   Primary osteoarthritis of right knee 08/18/2017   Mild aortic stenosis by prior echocardiogram 01/28/2017   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   Deficiency anemia 10/14/2016   Normocytic anemia 10/13/2016   Fibromyalgia 03/30/2016   Other spondylosis with radiculopathy, lumbar region 03/30/2016   Type 2 diabetes mellitus with peripheral neuropathy ([place]) 03/30/2016   Paroxysmal atrial fibrillation ([place]); CHA2DS2VASc score F, HTN, CAD, CVA = 5 06/28/2014   Hypertension associated with diabetes ([place])    PAD (peripheral artery disease) ([place]) 08/17/2013   Stenosis of right carotid artery  without infarction 10/08/2012   Dyslipidemia, goal LDL below 70 10/08/2012    Class: Diagnosis of   Mitral annular calcification 10/08/2012   Hypomagnesemia 06/13/2012   Renal transplant disorder 06/12/2012   Chronic allergic rhinitis 04/29/2011   Extrinsic asthma 09/09/2007   GERD 09/09/2007   COUGH, CHRONIC 09/09/2007   Morbid obesity - s/p Lap Band 9/'05 05/07/2004    Class: Diagnosis of   Hx of CABG 09/07/1993    Class: History of   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 07/1993   Coronary artery disease involving native coronary artery of native heart with unstable angina pectoris ([place]) 07/1993   ESRD (end stage renal disease) ([place]) 1991    Past Surgical History:  Procedure Laterality Date   ABDOMINAL AORTOGRAM N/A 04/21/2018   Procedure: ABDOMINAL AORTOGRAM;  Surgeon: [name], MD;  Location: [facility] CV [facility];  Service: Cardiovascular;  Laterality: N/A;   CATHETER REMOVAL     CHOLECYSTECTOMY N/A 10/29/2014   Procedure: LAPAROSCOPIC CHOLECYSTECTOMY WITH INTRAOPERATIVE CHOLANGIOGRAM;  Surgeon: Excell Seltzer, MD;  Location: [facility];  Service: General;  Laterality: N/A;   CORONARY ANGIOPLASTY  1994   x5    CORONARY ARTERY BYPASS GRAFT  1995   LIMA-LAD, SVG-RPDA, SVG-D1   CORONARY STENT INTERVENTION N/A 02/20/2021   Procedure: CORONARY STENT INTERVENTION;  Surgeon: [name], MD;  Location: [facility];  Service: Cardiovascular;  Laterality: N/A;   ESOPHAGOGASTRODUODENOSCOPY N/A 10/15/2016   Procedure: ESOPHAGOGASTRODUODENOSCOPY (EGD);  Surgeon: Wilford Corner, MD;  Location: [facility] ENDOSCOPY;  Service: Endoscopy;  Laterality: N/A;   I & D EXTREMITY Right 01/29/2018   Procedure: IRRIGATION AND DEBRIDEMENT THUMB;  Surgeon: Dayna Barker, MD;  Location: [facility];  Service: Plastics;  Laterality: Right;   INCISE AND DRAIN ABCESS     INTRAVASCULAR PRESSURE WIRE/FFR STUDY N/A 02/20/2021   Procedure: INTRAVASCULAR PRESSURE WIRE/FFR STUDY;  Surgeon: [name], MD;  Location: [facility] LAB;  Service: Cardiovascular;  Laterality: N/A;   KIDNEY TRANSPLANT  1991   KNEE ARTHROSCOPY WITH LATERAL MENISECTOMY Left 12/03/2017   Procedure: LEFT KNEE ARTHROSCOPY WITH LATERAL MENISECTOMY;  Surgeon: Earlie Server, MD;  Location: [facility];  Service: Orthopedics;  Laterality: Left;   LAPAROSCOPIC GASTRIC BANDING  04/2004; 10/'09, 2/'10   Port Replacement x 2   LEFT HEART CATH AND CORONARY ANGIOGRAPHY N/A 02/20/2021   Procedure: LEFT HEART CATH AND CORONARY ANGIOGRAPHY;  Surgeon: [name], MD;  Location: [facility] CV LAB;  Service: Cardiovascular;  Laterality: N/A;   LEFT HEART CATH AND CORS/GRAFTS ANGIOGRAPHY N/A 04/21/2018   Procedure: LEFT HEART CATH AND CORS/GRAFTS ANGIOGRAPHY;  Surgeon: [name], MD;  Location: [facility] CV LAB;  Ost-Prox LAD 50% - proxLAD (pre & post D1) 100% CTO. Cx - patent, small OM1 (stable ~ ostial OM1 90%, too small for PCI) & 2 small LPL; Ost-distal RCA 100% CTO.  LIMA-LAD (not injected); SVG-dRCA patent, SVG-D1 - insertion stent ~20% ISR - Severe R CFA disease w/ focal Sub TO   LEFT HEART CATH AND CORS/GRAFTS ANGIOGRAPHY  5/'01, 3/'02, 8/'03, 10/'04; 1/'15    08/22/2013: LAD & RCA 100%; LIMA-LAD & SVG-rPDA patent; Cx-- OM1 60%, OM2 ostial ~50%; SVG-D1 - 80% mid, 50% distal ISR --PCI   LEFT HEART CATH AND CORS/GRAFTS ANGIOGRAPHY N/A 01/31/2021   Procedure: LEFT HEART CATH AND CORS/GRAFTS ANGIOGRAPHY;  Surgeon: Jolaine Artist, MD;  Location: [facility];  Service: Cardiovascular;  Laterality: N/A;   LEFT  HEART CATHETERIZATION WITH CORONARY/GRAFT ANGIOGRAM N/A 08/23/2013   Procedure: LEFT HEART CATHETERIZATION WITH Beatrix Fetters;  Surgeon: Wellington Hampshire, MD;  Location: [facility] CATH LAB;  Service: Cardiovascular;  Laterality: N/A;   Lower Extremity Arterial Dopplers  08/2013   ABI: R 0.96, L 1.04   MULTIPLE TOOTH EXTRACTIONS  age 29   NM MYOVIEW LTD  03/2016   EF 62%. LOW RISK. C/W prior MI - no Ischemia. Apical hypokinesis.   PERCUTANEOUS CORONARY STENT INTERVENTION (PCI-S)  5/'01, 3/'02, 8/'03, 10/'04;   '01 - S660 BMS 2.5 x 9 - dSVG-D1 into D1; '02- post-stent stenosis - 2.5 x 8 Pixel BMS; '8\03: ISR/Thrombosis into native D1 - AngioJet, 2.5 x 13 Pixel; '04 - ISR 95% - covered stented area with Taxus DES 2.5 mm x 20 (2.88)   PERCUTANEOUS CORONARY STENT INTERVENTION (PCI-S)  08/23/2013   Procedure: PERCUTANEOUS CORONARY STENT INTERVENTION (PCI-S);  Surgeon: Wellington Hampshire, MD;  Location: [facility] CATH LAB;  Service: Cardiovascular;;mid SVG-D1 80%; distal stent ~50% ISR; Promus [place] DES 2.75 mm xc 20 mm (2.8 mm)   PORT-A-CATH REMOVAL     kidney   TRANSTHORACIC ECHOCARDIOGRAM  07/2019   EF 55 to 60%.  No LVH.  Paradoxical septal wall motion-post CABG.  GRII DD.  Normal RV size and function.  Mild bilateral atrial dilation.  Moderate MAC.  Trace MR.  Mild aortic stenosis (gradients: Mean 14.3 mmHg -peak 24.9 mmHg).   TUBAL LIGATION     wrist fistula repair Left    dialysis for one year     OB History   No obstetric history on file.     Family History  Problem Relation Age of Onset   Cancer Mother        liver   Heart disease Father     Cancer Father        colon   Arrhythmia Brother        Atrial Fibrillation   Arrhythmia Paternal Aunt        Atrial Fibrillation    Social History   Tobacco Use   Smoking status: Former    Packs/day: 1.00    Years: 30.00    Pack years: 30.00    Types: Cigarettes    Quit date: 08/17/2002    Years since quitting: 18.6   Smokeless tobacco: Never  Vaping Use   Vaping Use: Never used  Substance Use Topics   Alcohol use: No   Drug use: No    Home Medications Prior to Admission medications   Medication Sig Start Date End Date Taking? Authorizing Provider  acetaminophen (TYLENOL) 500 MG tablet Take 1,500 mg by mouth daily.    [provider]  albuterol (VENTOLIN HFA) 108 (90 Base) MCG/ACT inhaler TAKE 2 PUFFS BY MOUTH EVERY 6 HOURS AS NEEDED FOR WHEEZE OR SHORTNESS OF BREATH 02/10/21   Collene Gobble, MD  amoxicillin (AMOXIL) 250 MG capsule Take 1 capsule (250 mg total) by mouth 3 (three) times daily. 03/12/21   Meredith Staggers, MD  arformoterol (BROVANA) 15 MCG/2ML NEBU Take 2 mLs (15 mcg total) by nebulization 2 (two) times daily. 08/30/19   Collene Gobble, MD  azaTHIOprine (IMURAN) 50 MG tablet Take 125 mg by mouth daily.     [provider]  B-D ULTRAFINE III SHORT PEN 31G X 8 MM MISC See admin instructions. 11/06/19   [provider]  Blood Glucose Monitoring Suppl (ONE TOUCH ULTRA 2) w/Device KIT 2 (two) times daily. 04/08/20  [provider]  budesonide (PULMICORT) 0.5 MG/2ML nebulizer solution Take 2 mLs (0.5 mg total) by nebulization 2 (two) times daily. 08/24/19   [name], MD  calcitRIOL (ROCALTROL) 0.25 MCG capsule Take 0.25 mcg by mouth every 3 (three) days.     [provider]  cetirizine (ZYRTEC) 10 MG tablet Take 10 mg by mouth daily.    [provider]  clopidogrel (PLAVIX) 75 MG tablet Take 1 tablet (75 mg total) by mouth daily. 02/22/21   Cheryln Manly, NP  clotrimazole (MYCELEX) 10 MG troche Take 1  tablet (10 mg total) by mouth 5 (five) times daily as needed (thrush). 02/03/21   Danford, Suann Larry, MD  cyclobenzaprine (FLEXERIL) 10 MG tablet TAKE 1 TO 2 TABS BY MOUTH DAILY. TAKE 2 TAB DAILY AT BEDTIME, MAY ALSO TAKE 1 TABLET BY MOUTH AT NOON AS NEEDED FOR MUSCLE SPASMS 01/30/21   Meredith Staggers, MD  diltiazem (CARDIZEM) 30 MG tablet take 1 tablet every 4 hours AS NEEDED for afib heart rate >100 as long as top blood pressure >100. 03/04/21   Sherran Needs, NP  dofetilide (TIKOSYN) 250 MCG capsule Take 1 capsule (250 mcg total) by mouth 2 (two) times daily. 03/04/21   Sherran Needs, NP  DULoxetine (CYMBALTA) 30 MG capsule TAKE 2 CAPSULES (60 MG TOTAL) BY MOUTH EVERY MORNING. 10/30/20   Bayard Hugger, NP  ELIQUIS 5 MG TABS tablet TAKE 1 TABLET BY MOUTH TWICE A DAY (TO REPLACE WARFARIN) 10/02/20   [name], MD  fluticasone [place]) 50 MCG/ACT nasal spray PLACE 2 SPRAYS INTO BOTH NOSTRILS 2 TIMES DAILY 12/17/20   Collene Gobble, MD  furosemide (LASIX) 40 MG tablet Take 1 tablet (40 mg total) by mouth daily. 07/14/20   Dwyane Dee, MD  glimepiride (AMARYL) 4 MG tablet Take 4 mg by mouth daily. 02/03/20   [provider]  insulin aspart (NOVOLOG FLEXPEN) 100 UNIT/ML FlexPen Inject 14 Units into the skin 3 (three) times daily with meals.    [provider]  Insulin Glargine (BASAGLAR KWIKPEN) 100 UNIT/ML SOPN Inject 12 Units into the skin daily before breakfast.     [provider]  isosorbide mononitrate (IMDUR) 60 MG 24 hr tablet Take 1 tablet (60 mg total) by mouth daily. 02/04/21   Edwin Dada, MD  lamoTRIgine (LAMICTAL) 25 MG tablet TAKE 1 TABLET BY MOUTH EVERYDAY AT BEDTIME 03/05/21   Meredith Staggers, MD  Lancets [facility] DELICA PLUS [id number]) MISC daily. as directed 03/26/20   [provider]  LORazepam (ATIVAN) 1 MG tablet Take 1 tablet (1 mg total) by mouth 2 (two) times daily. 03/20/21   [name], MD  metFORMIN  (GLUCOPHAGE) 500 MG tablet Take 1 tablet (500 mg total) by mouth 2 (two) times daily. 02/22/21   Burnell Blanks, MD  metoprolol tartrate (LOPRESSOR) 100 MG tablet Take 1 tablet (100 mg total) by mouth 2 (two) times daily. Appointment Required For Further Refills [phone] 02/03/21   Edwin Dada, MD  montelukast (SINGULAIR) 10 MG tablet TAKE 1 TABLET BY MOUTH EVERYDAY AT BEDTIME 02/10/21   Byrum, Rose Fillers, MD  nitroGLYCERIN (NITROSTAT) 0.4 MG SL tablet Place 1 tablet (0.4 mg total) under the tongue every 5 (five) minutes as needed for chest pain. 02/21/21   Burnell Blanks, MD  ONETOUCH ULTRA test strip USE AS DIRECTED 3 TIMES DAILY TO CHECK BLOOD SUGAR 11/04/19   [provider]  Oxycodone HCl  10 MG TABS Take 1 tablet (10 mg total) by mouth 3 (three) times daily as needed. 03/12/21   Meredith Staggers, MD  OXYGEN Inhale 3 L into the lungs continuous.    [provider]  pantoprazole (PROTONIX) 40 MG tablet Take 1 tablet (40 mg total) by mouth daily. 02/21/21 05/22/21  Cheryln Manly, NP  predniSONE (DELTASONE) 5 MG tablet Take 5 mg by mouth daily. 06/19/19   [provider]  promethazine (PHENERGAN) 25 MG tablet Take 1 tablet (25 mg total) by mouth at bedtime as needed for nausea. For nausea and sleep 07/14/20   Dwyane Dee, MD  rosuvastatin (CRESTOR) 40 MG tablet Take 1 tablet (40 mg total) by mouth daily. 02/03/21   Danford, Suann Larry, MD  sertraline (ZOLOFT) 100 MG tablet Take 100 mg by mouth daily. 06/12/20   [provider]    Allergies    Tetracycline, Niacin, Niaspan [niacin er], Sulfa antibiotics, Sulfonamide derivatives, Codeine, Erythromycin, Hydromorphone hcl, Morphine and related, Nalbuphine, Sulfasalazine, and Tape  Review of Systems   Review of Systems  Constitutional:  Positive for fatigue. Negative for chills, diaphoresis and fever.  HENT:  Negative for congestion, rhinorrhea and sneezing.   Eyes: Negative.    Respiratory:  Positive for cough, chest tightness and shortness of breath.   Cardiovascular:  Positive for chest pain. Negative for leg swelling.  Gastrointestinal:  Negative for abdominal pain, blood in stool, diarrhea, nausea and vomiting.  Genitourinary:  Negative for difficulty urinating, flank pain, frequency and hematuria.  Musculoskeletal:  Negative for arthralgias and back pain.  Skin:  Negative for rash.  Neurological:  Negative for dizziness, speech difficulty, weakness, numbness and headaches.   Physical Exam Updated Vital Signs BP (!) 138/50   Pulse 83   Temp 99.8 F (37.7 C) (Oral)   Resp (!) 22   Ht _0  (1.549 m)   Wt 97.5 kg   SpO2 97%   BMI 40.62 kg/m   Physical Exam Constitutional:      Appearance: She is well-developed. She is obese.  HENT:     Head: Normocephalic and atraumatic.  Eyes:     Pupils: Pupils are equal, round, and reactive to light.  Cardiovascular:     Rate and Rhythm: Normal rate and regular rhythm.     Heart sounds: Normal heart sounds.  Pulmonary:     Effort: Pulmonary effort is normal. No respiratory distress.     Breath sounds: Normal breath sounds. No wheezing or rales.  Chest:     Chest wall: No tenderness.  Abdominal:     General: Bowel sounds are normal.     Palpations: Abdomen is soft.     Tenderness: There is no abdominal tenderness. There is no guarding or rebound.  Musculoskeletal:        General: Normal range of motion.     Cervical back: Normal range of motion and neck supple.     Right lower leg: Edema present.     Left lower leg: Edema present.  Lymphadenopathy:     Cervical: No cervical adenopathy.  Skin:    General: Skin is warm and dry.     Findings: No rash.  Neurological:     Mental Status: She is alert and oriented to person, place, and time.    ED Results / Procedures / Treatments   Labs (all labs ordered are listed, but only abnormal results are displayed) Labs Reviewed  BASIC METABOLIC PANEL -  Abnormal; Notable for the  following components:      Result Value   Glucose, Bld 219 (*)    Calcium 8.8 (*)    All other components within normal limits  CBC - Abnormal; Notable for the following components:   RBC 3.41 (*)    Hemoglobin 10.5 (*)    HCT 32.9 (*)    RDW 15.9 (*)    All other components within normal limits  TROPONIN I (HIGH SENSITIVITY) - Abnormal; Notable for the following components:   Troponin I (High Sensitivity) 26 (*)    All other components within normal limits  RESP PANEL BY RT-PCR (FLU A&B, COVID) ARPGX2  SEDIMENTATION RATE  C-REACTIVE PROTEIN  TROPONIN I (HIGH SENSITIVITY)    EKG EKG Interpretation  Date/Time:  Friday March 21 2021 09:49:53 EDT Ventricular Rate:  70 PR Interval:  179 QRS Duration: 152 QT Interval:  472 QTC Calculation: 510 R Axis:   -29 Text Interpretation: Sinus rhythm Left bundle branch block Confirmed by Malvin Johns [phone]) on 03/21/2021 11:58:13 AM  Radiology DG Chest Port 1 View  Result Date: 03/21/2021 CLINICAL DATA:  Chest pain and shortness of breath EXAM: PORTABLE CHEST 1 VIEW COMPARISON:  02/19/2021 FINDINGS: Cardiomegaly. CABG and coronary/venous graft stenting. Interstitial coarsening similar to priors. No Kerley lines or definite effusion. No air bronchogram. IMPRESSION: Chronic cardiomegaly and interstitial coarsening. No acute finding when compared to priors. Electronically Signed   By: Monte Fantasia M.D.   On: 03/21/2021 10:14    Procedures Procedures   Medications Ordered in ED Medications - No data to display  ED Course  I have reviewed the triage vital signs and the nursing notes.  Pertinent labs & imaging results that were available during my care of the patient were reviewed by me and considered in my medical decision making (see chart for details).    MDM Rules/Calculators/A&P HEAR Score: 5                         Patient is a 65 year old female who presents with chest pain.  She has an extensive  cardiac history with recent stent placement and ongoing diffuse disease.  Her pain does sound atypical and she states that its not consistent with her prior heart pain.  However she does have a new left bundle branch block.  On arrival with a new bundle branch block, I did speak with Dr. Ellyn Hack who feels that we should continue with the normal cardiac work-up.  Her first troponin is mildly elevated.  Her second troponin is pending.  Her x-ray shows some chronic changes but no evidence of acute pulmonary edema or pneumonia.  I have consulted cardiology who will see the patient.  Dr. Darl Householder to take over care pending decision of cardiology. Final Clinical Impression(s) / ED Diagnoses Final diagnoses:  Chest pain, unspecified type  LBBB (left bundle branch block)    Rx / DC Orders ED Discharge Orders     None        Malvin Johns, MD 03/21/21 1552

## 2021-03-21 NOTE — ED Triage Notes (Signed)
Pt arrived via [facility] with C/C of Chest pain, shOB. CP central when she takes deep breath, not eating or drinking normal due to no energy. Pt hx of kidney transplant ibn 91, but complaints of back pain.   104 (P), 96% [place] @4L , CBG 187

## 2021-03-21 NOTE — ED Provider Notes (Signed)
  Physical Exam  BP 114/73   Pulse 74   Temp 99.8 F (37.7 C) (Oral)   Resp (!) 25   Ht 5' 1" (1.549 m)   Wt 97.5 kg   SpO2 95%   BMI 40.62 kg/m   Physical Exam  ED Course/Procedures     Procedures  MDM  Care assumed at 4 pm.  Patient is here with chest pain and chills.  Patient does have a history of CABG and multiple stents.  Cardiology saw patient was concern for possible pericarditis and recommend ESR and CRP and repeat troponin.  7:22 PM ESR and CRP mildly elevated.  Repeat troponin is stable.  Hospitalist to admit for rule out ACS versus pericarditis.     Drenda Freeze, MD 03/21/21 [phone]

## 2021-03-21 NOTE — ED Notes (Signed)
Report given to Keith W, RN   

## 2021-03-22 ENCOUNTER — Encounter (HOSPITAL_COMMUNITY): Payer: Self-pay | Admitting: Internal Medicine

## 2021-03-22 DIAGNOSIS — R079 Chest pain, unspecified: Secondary | ICD-10-CM | POA: Diagnosis not present

## 2021-03-22 DIAGNOSIS — I447 Left bundle-branch block, unspecified: Secondary | ICD-10-CM | POA: Diagnosis not present

## 2021-03-22 DIAGNOSIS — I480 Paroxysmal atrial fibrillation: Secondary | ICD-10-CM

## 2021-03-22 DIAGNOSIS — J9611 Chronic respiratory failure with hypoxia: Secondary | ICD-10-CM

## 2021-03-22 DIAGNOSIS — I2510 Atherosclerotic heart disease of native coronary artery without angina pectoris: Secondary | ICD-10-CM | POA: Diagnosis not present

## 2021-03-22 LAB — GLUCOSE, CAPILLARY
Glucose-Capillary: 175 mg/dL — ABNORMAL HIGH (ref 70–99)
Glucose-Capillary: 176 mg/dL — ABNORMAL HIGH (ref 70–99)

## 2021-03-22 MED ORDER — ALUM & MAG HYDROXIDE-SIMETH 200-200-20 MG/5ML PO SUSP
30.0000 mL | ORAL | Status: DC | PRN
  Administered 2021-03-22: 30 mL via ORAL
  Filled 2021-03-22: qty 30

## 2021-03-22 MED ORDER — DICLOFENAC SODIUM 1 % EX GEL: 2.0000 g | Freq: Four times a day (QID) | CUTANEOUS | 0 refills | Status: DC

## 2021-03-22 MED ORDER — METOPROLOL TARTRATE 100 MG PO TABS
100.0000 mg | ORAL_TABLET | Freq: Two times a day (BID) | ORAL | 0 refills | Status: DC
Start: 2021-03-22 — End: 2021-08-25

## 2021-03-22 MED ORDER — POTASSIUM CHLORIDE CRYS ER 20 MEQ PO TBCR
20.0000 meq | EXTENDED_RELEASE_TABLET | Freq: Once | ORAL | Status: AC
  Administered 2021-03-22: 20 meq via ORAL
  Filled 2021-03-22: qty 1

## 2021-03-22 MED ORDER — ISOSORBIDE MONONITRATE ER 60 MG PO TB24: 60.0000 mg | ORAL_TABLET | Freq: Every day | ORAL | 0 refills | Status: DC

## 2021-03-22 MED ORDER — DICLOFENAC SODIUM 1 % EX GEL
2.0000 g | Freq: Four times a day (QID) | CUTANEOUS | Status: DC
  Administered 2021-03-22: 2 g via TOPICAL
  Filled 2021-03-22: qty 100

## 2021-03-22 NOTE — Progress Notes (Signed)
Progress Note  Patient Name: Katherine [name] Date of Encounter: 03/22/2021  [facility] Cardiologist: [name], MD   Subjective   Chest pain is improved   Now more in back  Achy elswhere    Inpatient Medications    Scheduled Meds:  apixaban  5 mg Oral BID   arformoterol  15 mcg Nebulization BID   azaTHIOprine  125 mg Oral Daily   budesonide  2 mL Nebulization BID   calcitRIOL  0.25 mcg Oral Q3 days   clopidogrel  75 mg Oral Daily   cyclobenzaprine  20 mg Oral QHS   dofetilide  250 mcg Oral BID   DULoxetine  60 mg Oral q morning   fluticasone  2 spray Each Nare BID   insulin aspart  0-15 Units Subcutaneous TID WC   insulin aspart  4 Units Subcutaneous TID WC   insulin glargine-yfgn  15 Units Subcutaneous QAC breakfast   isosorbide mononitrate  60 mg Oral Daily   lamoTRIgine  25 mg Oral QHS   LORazepam  1 mg Oral BID   metoprolol tartrate  100 mg Oral BID   montelukast  10 mg Oral QHS   pantoprazole  40 mg Oral Daily   predniSONE  5 mg Oral QHS   rosuvastatin  40 mg Oral Daily   sertraline  200 mg Oral QHS   Continuous Infusions:  PRN Meds: acetaminophen, albuterol, cyclobenzaprine, ondansetron (ZOFRAN) IV, oxyCODONE, promethazine   Vital Signs    Vitals:   03/21/21 2106 03/21/21 2108 03/21/21 2227 03/22/21 0627  BP:  (!) 152/47 118/71 (!) 153/73  Pulse:  74 75 61  Resp:  15  20  Temp:  98.7 F (37.1 C)  97.6 F (36.4 C)  TempSrc:  Oral  Oral  SpO2: 98% 96%  98%  Weight:      Height:       No intake or output data in the 24 hours ending 03/22/21 0701 Last 3 Weights 03/21/2021 03/12/2021 02/20/2021  Weight (lbs) 215 lb 233 lb 233 lb 4 oz  Weight (kg) 97.523 kg 105.688 kg 105.8 kg      Telemetry    Sinus brady/ sinus rhythm   - Personally Reviewed  ECG     - Personally Reviewed  Physical Exam   GEN: No acute distress.   Neck: No JVD Cardiac: RRR, no murmurs, Chset  Tender   Respiratory: Clear to auscultation bilaterally. GI: Soft,  nontender, non-distended  MS: No edema; No deformity. Neuro:  Nonfocal  Psych: Normal affect   Labs    High Sensitivity Troponin:   Recent Labs  Lab 03/21/21 1223 03/21/21 1657  TROPONINIHS 26* 23*      Chemistry Recent Labs  Lab 03/21/21 1223  NA 138  K 3.5  CL 100  CO2 28  GLUCOSE 219*  BUN 10  CREATININE 0.96  CALCIUM 8.8*  GFRNONAA >60  ANIONGAP 10     Hematology Recent Labs  Lab 03/21/21 1223  WBC 9.2  RBC 3.41*  HGB 10.5*  HCT 32.9*  MCV 96.5  MCH 30.8  MCHC 31.9  RDW 15.9*  PLT 207    BNPNo results for input(s): BNP, PROBNP in the last 168 hours.   DDimer No results for input(s): DDIMER in the last 168 hours.   Radiology    DG Lumbar Spine 2-3 Views  Result Date: 03/21/2021 CLINICAL DATA:  Low back pain EXAM: LUMBAR SPINE - 3 VIEW COMPARISON:  None. FINDINGS: Five lumbar type  vertebral bodies are well visualized. Vertebral body height is well maintained. Facet hypertrophic changes are noted. Mild anterolisthesis of L4 on L5 is seen. Gastric lap band is noted and appears within normal limits. Diffuse vascular calcifications are noted. No acute bony abnormality is seen. IMPRESSION: Degenerative changes as described.  No acute abnormality noted. Electronically Signed   By: Inez Catalina M.D.   On: 03/21/2021 23:38   DG Chest Port 1 View  Result Date: 03/21/2021 CLINICAL DATA:  Chest pain and shortness of breath EXAM: PORTABLE CHEST 1 VIEW COMPARISON:  02/19/2021 FINDINGS: Cardiomegaly. CABG and coronary/venous graft stenting. Interstitial coarsening similar to priors. No Kerley lines or definite effusion. No air bronchogram. IMPRESSION: Chronic cardiomegaly and interstitial coarsening. No acute finding when compared to priors. Electronically Signed   By: Monte Fantasia M.D.   On: 03/21/2021 10:14   DG HIP UNILAT WITH PELVIS 2-3 VIEWS LEFT  Result Date: 03/21/2021 CLINICAL DATA:  Left hip pain EXAM: DG HIP (WITH OR WITHOUT PELVIS) 3V LEFT COMPARISON:   06/07/2019 FINDINGS: Postsurgical changes are noted in the left hip. Vascular calcifications are noted. Pelvic ring is intact. Mild degenerative changes are noted in the hip joints left greater than right. No acute fracture or dislocation is noted IMPRESSION: Degenerative changes without acute abnormality. Electronically Signed   By: Inez Catalina M.D.   On: 03/21/2021 23:36    Cardiac Studies     Cath: 02/20/21   Mid RCA to Dist RCA lesion is 100% stenosed. Ost LAD to Prox LAD lesion is 50% stenosed. Prox LAD-1 lesion is 100% stenosed. Prox LAD-2 lesion is 100% stenosed. Ost Cx to Prox Cx lesion is 60% stenosed -focal eccentric heavily calcified lesion -> negative RFR (0.96) 1st Mrg lesion is 40% stenosed. 2nd Mrg lesion is 80% stenosed - focal SVG-RCA graft was visualized by angiography and is large. The graft exhibits minimal luminal irregularities. SCG-RCA: Mid Graft to Dist Graft lesion is 40% stenosed. LIMA graft was not visualized due to inability to cannulate. ------CULPRIT LESIONS - SVG-DIAG ISR & Native Diag 95% ------- SVG-Diag: Prox Graft to Mid Graft Stent is 10% stenosed. SVG-Diag: Dist Graft to Insertion Stent is 90% stenosed. Scoring balloon angioplasty was performed using a BALLOON SCOREFLEX 2.50X10. Followed by 2.75 mm [place] balloon post dilation Post intervention, there is a 25% residual stenosis. Ost 1st Diag Stent (from Graft into native) is 80% re-stenosed. Native 1st Diag (immediately after the sten) is 95% stenosed. A drug-eluting stent was successfully placed overlapping from the distal stent edge crossing the lesion, using a STENT ONYX FRONTIER 2.25X12. -> Postdilated at the overlap segment to 2.6 mm, and distally to 2.4 mm -------------------------------------------- Post intervention, there is a 15% residual stenosis at the 80% site, and 0% residual stenosis at the 95% site.. LV end diastolic pressure is moderate-severely elevated.   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 LIMA to LAD not visualized as only access was right radial SVG to RCA 40% - confirmed SVG to D2 recurrent high-grade 90% ISR with 95% lesion in D2 after graft insertion Successful DES PCI of 95% lesion in D2 after graft insertion and recent stent with Onyx Frontier 2.25 mm x 12 mm postdilated to 2.5 mm distally and 2.75 mm at the overlap. Borderline successful PTCA of in-stent restenosis in the body of graft. ->  2.5 mm scoring balloon followed by post dilation with  a 2.75 mm [place] balloon     RECOMMENDATIONS Overnight observation on 6 E. post procedure unit. ->  Difficult TR band management. She will restart Eliquis tonight, and continue with clopidogrel without aspirin. If stable, anticipate potential discharge tomorrow After discussion with the patient's close family member, would consider discussing possibility of home health nurse/physical therapy to assist during the patient's recovery.   [name], MD   Diagnostic Dominance: Right      Intervention      _____________  Patient Profile     65 y.o. female hx of CAD( s/p CABG), HTN, HL, Atrial fbrillation, COPD, PAD and s/p renal tx   Hx instent restenosis of SVG with intervention in July 2022  Presents with F/C, cough and CP Trop 26  Assessment & Plan    1  CP   Pt with CP   Atypical for cardiac      Does not apper to be due to coronary ischemia   Does not sound like pericarditis/myocarditis   Not positional, not pleuritic   No rub  EKG does not support    Note CRP and ESR are elevated   Nonspecific  I would hold on further heart testing for now     2  Trop  Trivial flat elevation      3  CAD   Hx of CABG   I am not convinced of active ischemia  INtervention in July 2022    4   LBBB  Pt has had before   INtermittent        6  Atrial fibrllaiton   Remains in SR on Tikosyn   Continue Eliquis        For questions or updates, please contact [facility] Please consult [email or web address] for contact info under        Signed, Dorris Carnes, MD  03/22/2021, 7:01 AM

## 2021-03-22 NOTE — Care Management (Signed)
Patient to dc home today. Denies any needs. However, will need portable oxygen tank. Called [employer] at 428 pm and spoke with [facility] to request portable tank be delivered to patient's room before dc.  No further needs assessed.    Marthenia Rolling, MSN, RN,BSN Inpatient [facility] Case Manager [phone]

## 2021-03-22 NOTE — Progress Notes (Signed)
Patient is on home Tikosyn. Potassium on 8/5 was 3.5. Discussed with Dr. Harrington Challenger and will give patient one time dose of KCl 20 mEq today. Recheck AM K and Mg.   Goal K > 4  Goal Mg > 2  Thank you for involving pharmacy in this patient's care.  Elita Quick, PharmD PGY1 Ambulatory Care Pharmacy Resident 03/22/2021 2:55 PM  **Pharmacist phone directory can be found on [email or web address] listed under [place]**

## 2021-03-22 NOTE — Progress Notes (Signed)
Discharge instructions reviewed with patient.  These included, but were not limited to, the following:  discharge medication, follow-up appointments, prescriptions and re-fills required, when to call the MD, treatment of atypical chest pain, alternative methods of pain management.  Patient currently awaiting brother for ride home.

## 2021-03-22 NOTE — Discharge Summary (Addendum)
Physician Discharge Summary  Katherine [name] LFY:[id number] DOB: August 08, 1956 DOA: 03/21/2021  PCP: [name], MD  Admit date: 03/21/2021 Discharge date: 03/22/2021  Admitted From: home  Disposition:  home   Recommendations for Outpatient Follow-up:  Cont Voltaren gel for sternal tenderness and follow for improvement Follow for compliance with meds- she mentions today that she has been out of her Imdur and Metoprolol and needs prescriptions.   Home Health:  none  Discharge Condition:  stable   CODE STATUS:  Full code   Consultations: cardiology  Procedures/Studies: none   Discharge Diagnoses:  Principal Problem:   Chest pain Active Problems:   Viral syndrome   Hypertension associated with diabetes (HCC)   Paroxysmal atrial fibrillation ([place]); CHA2DS2VASc score F, HTN, CAD, CVA = 5   Type 2 diabetes mellitus with peripheral neuropathy (HCC)   LBBB (left bundle branch block)   Renal transplant, status post   Chronic bronchitis, mucopurulent (HCC)   CAD in native artery   Chronic respiratory failure with hypoxia ([place])   Brief Summary: Katherine [name] is a 65 y.o. female with medical history significant of DM2, HTN, HLD, COPD, renal transplant in 1991, A.Fib on eliquis, CAD s/p CABG 1995, multiple PCIs since 2001, most recent PCI with DES to SVG-D1 just last month.   Pt with recent [facility] 01/31/21 for CP showing severe native CAD with CTO of LAD and RCA, new 60% stenosis in the L Cx, and SVG-D2 with recurrent high grade 90% ISR with 95% lesion in the D2 after graft insertion. Medical therapy was pursued with plans for possible intervention on ostial L Cx and repeat PCI of SVG-D2 if she fails medical therapy. She was brought back to the lab 02/20/21. RFR negative in L Cx, PCI deferred. DES placed to 95% Iesion in D2 after graft insertion, borderline successful PTCA of ISR in the graft. She presented to the ED with chest pain when taking a deep breath and coughing.   Hospital  Course:  Chest pain- h/o CAD - Troponin 26> 23 - patient sleeping when I entered the room- awakens when I speak with her - no complaints of pain but when I press on her upper sternum, she is quite tender - cardiology does not feel this is cardiac pain and I agree - will prescribe Voltaren gel-   Cough- h/o COPD- chronic resp failure with hypoxia - COVID and influenza negative- no cough while I was in the room- chest is clear - f/u as outpt  A-fib on Eliquis - rate controlled   Discharge Exam: Vitals:   03/22/21 1300 03/22/21 1500  BP: (!) 108/49 (!) 131/55  Pulse: (!) 54 (!) 56  Resp: 16 17  Temp:    SpO2: 97% 98%   Vitals:   03/22/21 0900 03/22/21 1200 03/22/21 1300 03/22/21 1500  BP: (!) 155/77 (!) 133/45 (!) 108/49 (!) 131/55  Pulse: 69 63 (!) 54 (!) 56  Resp: (!) $RemoveB'21 20 16 17  'cLokqYUC$ Temp:  98.3 F (36.8 C)    TempSrc:  Oral    SpO2: 95% 97% 97% 98%  Weight:      Height:        General: Pt is alert, awake, not in acute distress Cardiovascular: RRR, S1/S2 +, no rubs, no gallops Respiratory: CTA bilaterally, no wheezing, no rhonchi Abdominal: Soft, NT, ND, bowel sounds + Extremities: no edema, no cyanosis   Discharge Instructions  Discharge Instructions     Diet - low sodium heart healthy  Complete by: As directed    Diet Carb Modified   Complete by: As directed    Increase activity slowly   Complete by: As directed       Allergies as of 03/22/2021       Reactions   Tetracycline Hives   Patient tolerated Doxycycline Dec 2020   Niacin Other (See Comments)   Mouth blisters   Niaspan [niacin Er] Other (See Comments)   Mouth blisters   Sulfa Antibiotics Nausea Only, Other (See Comments)   "Tears up stomach"   Sulfonamide Derivatives Other (See Comments)   Reaction: per patient "tears her stomach up"   Codeine Nausea And Vomiting   Erythromycin Nausea And Vomiting   Hydromorphone Hcl Nausea And Vomiting   Morphine And Related Nausea And Vomiting    Nalbuphine Nausea And Vomiting   Nubain   Sulfasalazine Nausea Only, Other (See Comments)   per patient "tears her stomach up", "Tears up stomach"   Tape Rash, Other (See Comments)   No "plastic" tape," please----cloth tape only        Medication List     TAKE these medications    acetaminophen 500 MG tablet Commonly known as: TYLENOL Take 1,500 mg by mouth daily.   albuterol 108 (90 Base) MCG/ACT inhaler Commonly known as: VENTOLIN HFA TAKE 2 PUFFS BY MOUTH EVERY 6 HOURS AS NEEDED FOR WHEEZE OR SHORTNESS OF BREATH What changed: See the new instructions.   amoxicillin 250 MG capsule Commonly known as: AMOXIL Take 1 capsule (250 mg total) by mouth 3 (three) times daily.   arformoterol 15 MCG/2ML Nebu Commonly known as: BROVANA Take 2 mLs (15 mcg total) by nebulization 2 (two) times daily.   azaTHIOprine 50 MG tablet Commonly known as: IMURAN Take 125 mg by mouth daily.   B-D ULTRAFINE III SHORT PEN 31G X 8 MM Misc Generic drug: Insulin Pen Needle See admin instructions.   Basaglar KwikPen 100 UNIT/ML Inject 15 Units into the skin daily before breakfast.   budesonide 0.5 MG/2ML nebulizer solution Commonly known as: PULMICORT Take 2 mLs (0.5 mg total) by nebulization 2 (two) times daily.   calcitRIOL 0.25 MCG capsule Commonly known as: ROCALTROL Take 0.25 mcg by mouth every 3 (three) days.   cetirizine 10 MG tablet Commonly known as: ZYRTEC Take 10 mg by mouth daily.   clopidogrel 75 MG tablet Commonly known as: PLAVIX Take 1 tablet (75 mg total) by mouth daily.   clotrimazole 10 MG troche Commonly known as: MYCELEX Take 1 tablet (10 mg total) by mouth 5 (five) times daily as needed (thrush).   cyclobenzaprine 10 MG tablet Commonly known as: FLEXERIL TAKE 1 TO 2 TABS BY MOUTH DAILY. TAKE 2 TAB DAILY AT BEDTIME, MAY ALSO TAKE 1 TABLET BY MOUTH AT NOON AS NEEDED FOR MUSCLE SPASMS What changed: See the new instructions.   diclofenac Sodium 1 %  Gel Commonly known as: VOLTAREN Apply 2 g topically 4 (four) times daily.   diltiazem 30 MG tablet Commonly known as: Cardizem take 1 tablet every 4 hours AS NEEDED for afib heart rate >100 as long as top blood pressure >100. What changed:  how much to take how to take this when to take this additional instructions   dofetilide 250 MCG capsule Commonly known as: TIKOSYN Take 1 capsule (250 mcg total) by mouth 2 (two) times daily.   DULoxetine 30 MG capsule Commonly known as: CYMBALTA TAKE 2 CAPSULES (60 MG TOTAL) BY MOUTH EVERY MORNING. What changed: See  the new instructions.   Eliquis 5 MG Tabs tablet Generic drug: apixaban TAKE 1 TABLET BY MOUTH TWICE A DAY (TO REPLACE WARFARIN) What changed: See the new instructions.   fluticasone 50 MCG/ACT nasal spray Commonly known as: FLONASE PLACE 2 SPRAYS INTO BOTH NOSTRILS 2 TIMES DAILY What changed:  how much to take how to take this when to take this additional instructions   furosemide 40 MG tablet Commonly known as: LASIX Take 1 tablet (40 mg total) by mouth daily.   glimepiride 4 MG tablet Commonly known as: AMARYL Take 4 mg by mouth daily.   isosorbide mononitrate 60 MG 24 hr tablet Commonly known as: IMDUR Take 1 tablet (60 mg total) by mouth daily.   lamoTRIgine 25 MG tablet Commonly known as: LAMICTAL TAKE 1 TABLET BY MOUTH EVERYDAY AT BEDTIME What changed: See the new instructions.   LORazepam 1 MG tablet Commonly known as: ATIVAN Take 1 tablet (1 mg total) by mouth 2 (two) times daily.   metFORMIN 500 MG tablet Commonly known as: GLUCOPHAGE Take 1 tablet (500 mg total) by mouth 2 (two) times daily.   metoprolol tartrate 100 MG tablet Commonly known as: LOPRESSOR Take 1 tablet (100 mg total) by mouth 2 (two) times daily. Appointment Required For Further Refills [phone]   montelukast 10 MG tablet Commonly known as: SINGULAIR TAKE 1 TABLET BY MOUTH EVERYDAY AT BEDTIME What changed: See the new  instructions.   nitroGLYCERIN 0.4 MG SL tablet Commonly known as: NITROSTAT Place 1 tablet (0.4 mg total) under the tongue every 5 (five) minutes as needed for chest pain.   NovoLOG FlexPen 100 UNIT/ML FlexPen Generic drug: insulin aspart Inject 14 Units into the skin 3 (three) times daily with meals.   ONE TOUCH ULTRA 2 w/Device Kit 2 (two) times daily.   OneTouch Delica Plus Lancet30G Misc daily. as directed   OneTouch Ultra test strip Generic drug: glucose blood USE AS DIRECTED 3 TIMES DAILY TO CHECK BLOOD SUGAR   Oxycodone HCl 10 MG Tabs Take 1 tablet (10 mg total) by mouth 3 (three) times daily as needed.   OXYGEN Inhale 3 L into the lungs continuous.   pantoprazole 40 MG tablet Commonly known as: Protonix Take 1 tablet (40 mg total) by mouth daily.   predniSONE 5 MG tablet Commonly known as: DELTASONE Take 5 mg by mouth at bedtime.   promethazine 25 MG tablet Commonly known as: PHENERGAN Take 1 tablet (25 mg total) by mouth at bedtime as needed for nausea. For nausea and sleep   rosuvastatin 40 MG tablet Commonly known as: CRESTOR Take 1 tablet (40 mg total) by mouth daily.   sertraline 100 MG tablet Commonly known as: ZOLOFT Take 200 mg by mouth at bedtime.         Allergies  Allergen Reactions   Tetracycline Hives    Patient tolerated Doxycycline Dec 2020   Niacin Other (See Comments)    Mouth blisters   Niaspan [Niacin Er] Other (See Comments)    Mouth blisters   Sulfa Antibiotics Nausea Only and Other (See Comments)    "Tears up stomach"   Sulfonamide Derivatives Other (See Comments)    Reaction: per patient "tears her stomach up"   Codeine Nausea And Vomiting   Erythromycin Nausea And Vomiting   Hydromorphone Hcl Nausea And Vomiting   Morphine And Related Nausea And Vomiting   Nalbuphine Nausea And Vomiting    Nubain   Sulfasalazine Nausea Only and Other (See Comments)  per patient "tears her stomach up", "Tears up stomach"   Tape  Rash and Other (See Comments)    No "plastic" tape," please----cloth tape only      DG Lumbar Spine 2-3 Views  Result Date: 03/21/2021 CLINICAL DATA:  Low back pain EXAM: LUMBAR SPINE - 3 VIEW COMPARISON:  None. FINDINGS: Five lumbar type vertebral bodies are well visualized. Vertebral body height is well maintained. Facet hypertrophic changes are noted. Mild anterolisthesis of L4 on L5 is seen. Gastric lap band is noted and appears within normal limits. Diffuse vascular calcifications are noted. No acute bony abnormality is seen. IMPRESSION: Degenerative changes as described.  No acute abnormality noted. Electronically Signed   By: Inez Catalina M.D.   On: 03/21/2021 23:38   DG Chest Port 1 View  Result Date: 03/21/2021 CLINICAL DATA:  Chest pain and shortness of breath EXAM: PORTABLE CHEST 1 VIEW COMPARISON:  02/19/2021 FINDINGS: Cardiomegaly. CABG and coronary/venous graft stenting. Interstitial coarsening similar to priors. No Kerley lines or definite effusion. No air bronchogram. IMPRESSION: Chronic cardiomegaly and interstitial coarsening. No acute finding when compared to priors. Electronically Signed   By: Monte Fantasia M.D.   On: 03/21/2021 10:14   DG HIP UNILAT WITH PELVIS 2-3 VIEWS LEFT  Result Date: 03/21/2021 CLINICAL DATA:  Left hip pain EXAM: DG HIP (WITH OR WITHOUT PELVIS) 3V LEFT COMPARISON:  06/07/2019 FINDINGS: Postsurgical changes are noted in the left hip. Vascular calcifications are noted. Pelvic ring is intact. Mild degenerative changes are noted in the hip joints left greater than right. No acute fracture or dislocation is noted IMPRESSION: Degenerative changes without acute abnormality. Electronically Signed   By: Inez Catalina M.D.   On: 03/21/2021 23:36     The results of significant diagnostics from this hospitalization (including imaging, microbiology, ancillary and laboratory) are listed below for reference.     Microbiology: Recent Results (from the past 240 hour(s))   Resp Panel by RT-PCR (Flu A&B, Covid) Nasopharyngeal Swab     Status: None   Collection Time: 03/21/21  3:07 PM   Specimen: Nasopharyngeal Swab; Nasopharyngeal(NP) swabs in vial transport medium  Result Value Ref Range Status   SARS Coronavirus 2 by RT PCR NEGATIVE NEGATIVE Final    Comment: (NOTE) SARS-CoV-2 target nucleic acids are NOT DETECTED.  The SARS-CoV-2 RNA is generally detectable in upper respiratory specimens during the acute phase of infection. The lowest concentration of SARS-CoV-2 viral copies this assay can detect is 138 copies/mL. A negative result does not preclude SARS-Cov-2 infection and should not be used as the sole basis for treatment or other patient management decisions. A negative result may occur with  improper specimen collection/handling, submission of specimen other than nasopharyngeal swab, presence of viral mutation(s) within the areas targeted by this assay, and inadequate number of viral copies(<138 copies/mL). A negative result must be combined with clinical observations, patient history, and epidemiological information. The expected result is Negative.  Fact Sheet for Patients:  EntrepreneurPulse.com.au  Fact Sheet for Healthcare Providers:  IncredibleEmployment.be  This test is no t yet approved or cleared by the [place] FDA and  has been authorized for detection and/or diagnosis of SARS-CoV-2 by FDA under an Emergency Use Authorization (EUA). This EUA will remain  in effect (meaning this test can be used) for the duration of the COVID-19 declaration under Section 564(b)(1) of the Act, 21 U.S.C.section 360bbb-3(b)(1), unless the authorization is terminated  or revoked sooner.       Influenza A by PCR  NEGATIVE NEGATIVE Final   Influenza B by PCR NEGATIVE NEGATIVE Final    Comment: (NOTE) The Xpert Xpress SARS-CoV-2/FLU/RSV plus assay is intended as an aid in the diagnosis of influenza from  Nasopharyngeal swab specimens and should not be used as a sole basis for treatment. Nasal washings and aspirates are unacceptable for Xpert Xpress SARS-CoV-2/FLU/RSV testing.  Fact Sheet for Patients: EntrepreneurPulse.com.au  Fact Sheet for Healthcare Providers: IncredibleEmployment.be  This test is not yet approved or cleared by the [place] FDA and has been authorized for detection and/or diagnosis of SARS-CoV-2 by FDA under an Emergency Use Authorization (EUA). This EUA will remain in effect (meaning this test can be used) for the duration of the COVID-19 declaration under Section 564(b)(1) of the Act, 21 U.S.C. section 360bbb-3(b)(1), unless the authorization is terminated or revoked.  Performed at [facility], [place] [address], [place], [place] [address]      Labs: BNP (last 3 results) Recent Labs    07/11/20 0750 10/30/20 1949 01/29/21 0030  BNP 234.9* 300.3* 527.7*   Basic Metabolic Panel: Recent Labs  Lab 03/21/21 1223  NA 138  K 3.5  CL 100  CO2 28  GLUCOSE 219*  BUN 10  CREATININE 0.96  CALCIUM 8.8*   Liver Function Tests: No results for input(s): AST, ALT, ALKPHOS, BILITOT, PROT, ALBUMIN in the last 168 hours. No results for input(s): LIPASE, AMYLASE in the last 168 hours. No results for input(s): AMMONIA in the last 168 hours. CBC: Recent Labs  Lab 03/21/21 1223  WBC 9.2  HGB 10.5*  HCT 32.9*  MCV 96.5  PLT 207   Cardiac Enzymes: No results for input(s): CKTOTAL, CKMB, CKMBINDEX, TROPONINI in the last 168 hours. BNP: Invalid input(s): POCBNP CBG: Recent Labs  Lab 03/21/21 2248 03/22/21 0749 03/22/21 1149  GLUCAP 142* 175* 176*   D-Dimer No results for input(s): DDIMER in the last 72 hours. Hgb A1c No results for input(s): HGBA1C in the last 72 hours. Lipid Profile No results for input(s): CHOL, HDL, LDLCALC, TRIG, CHOLHDL, LDLDIRECT in the last 72 hours. Thyroid function studies No  results for input(s): TSH, T4TOTAL, T3FREE, THYROIDAB in the last 72 hours.  Invalid input(s): FREET3 Anemia work up No results for input(s): VITAMINB12, FOLATE, FERRITIN, TIBC, IRON, RETICCTPCT in the last 72 hours. Urinalysis    Component Value Date/Time   COLORURINE YELLOW 10/30/2020 1759   APPEARANCEUR CLEAR 10/30/2020 1759   LABSPEC 1.009 10/30/2020 1759   PHURINE 7.0 10/30/2020 1759   GLUCOSEU 50 (A) 10/30/2020 1759   GLUCOSEU 100 (A) 08/13/2020 1550   HGBUR NEGATIVE 10/30/2020 1759   BILIRUBINUR NEGATIVE 10/30/2020 1759   KETONESUR NEGATIVE 10/30/2020 1759   PROTEINUR NEGATIVE 10/30/2020 1759   UROBILINOGEN 0.2 08/13/2020 1550   NITRITE NEGATIVE 10/30/2020 1759   LEUKOCYTESUR NEGATIVE 10/30/2020 1759   Sepsis Labs Invalid input(s): PROCALCITONIN,  WBC,  LACTICIDVEN Microbiology Recent Results (from the past 240 hour(s))  Resp Panel by RT-PCR (Flu A&B, Covid) Nasopharyngeal Swab     Status: None   Collection Time: 03/21/21  3:07 PM   Specimen: Nasopharyngeal Swab; Nasopharyngeal(NP) swabs in vial transport medium  Result Value Ref Range Status   SARS Coronavirus 2 by RT PCR NEGATIVE NEGATIVE Final    Comment: (NOTE) SARS-CoV-2 target nucleic acids are NOT DETECTED.  The SARS-CoV-2 RNA is generally detectable in upper respiratory specimens during the acute phase of infection. The lowest concentration of SARS-CoV-2 viral copies this assay can detect is 138 copies/mL. A negative  result does not preclude SARS-Cov-2 infection and should not be used as the sole basis for treatment or other patient management decisions. A negative result may occur with  improper specimen collection/handling, submission of specimen other than nasopharyngeal swab, presence of viral mutation(s) within the areas targeted by this assay, and inadequate number of viral copies(<138 copies/mL). A negative result must be combined with clinical observations, patient history, and  epidemiological information. The expected result is Negative.  Fact Sheet for Patients:  EntrepreneurPulse.com.au  Fact Sheet for Healthcare Providers:  IncredibleEmployment.be  This test is no t yet approved or cleared by the [place] FDA and  has been authorized for detection and/or diagnosis of SARS-CoV-2 by FDA under an Emergency Use Authorization (EUA). This EUA will remain  in effect (meaning this test can be used) for the duration of the COVID-19 declaration under Section 564(b)(1) of the Act, 21 U.S.C.section 360bbb-3(b)(1), unless the authorization is terminated  or revoked sooner.       Influenza A by PCR NEGATIVE NEGATIVE Final   Influenza B by PCR NEGATIVE NEGATIVE Final    Comment: (NOTE) The Xpert Xpress SARS-CoV-2/FLU/RSV plus assay is intended as an aid in the diagnosis of influenza from Nasopharyngeal swab specimens and should not be used as a sole basis for treatment. Nasal washings and aspirates are unacceptable for Xpert Xpress SARS-CoV-2/FLU/RSV testing.  Fact Sheet for Patients: EntrepreneurPulse.com.au  Fact Sheet for Healthcare Providers: IncredibleEmployment.be  This test is not yet approved or cleared by the [place] FDA and has been authorized for detection and/or diagnosis of SARS-CoV-2 by FDA under an Emergency Use Authorization (EUA). This EUA will remain in effect (meaning this test can be used) for the duration of the COVID-19 declaration under Section 564(b)(1) of the Act, 21 U.S.C. section 360bbb-3(b)(1), unless the authorization is terminated or revoked.  Performed at [facility], [place] [address], [place], [place] [address]      Time coordinating discharge in minutes: 21  SIGNED:   Debbe Odea, MD  [facility] 03/22/2021, 4:02 PM

## 2021-03-22 NOTE — Plan of Care (Signed)

## 2021-03-24 ENCOUNTER — Telehealth: Payer: Self-pay | Admitting: Cardiology

## 2021-03-24 ENCOUNTER — Telehealth: Payer: Self-pay | Admitting: *Deleted

## 2021-03-24 ENCOUNTER — Telehealth: Payer: Self-pay

## 2021-03-24 DIAGNOSIS — M797 Fibromyalgia: Secondary | ICD-10-CM

## 2021-03-24 NOTE — Telephone Encounter (Signed)
Glenard Haring RN with [facility] called:   There is possible level 2 drug interaction with Dofetilide (Tikosyn) & Lamotrigine (Lamictal). Dofetilide is prescribed by Roderic Palau.  Please advise if changes need to made.   Also the same issue  for Eliquis & Plavix. I have advised her to contact the PCP  & Cardiologist.  Call back phone [phone].

## 2021-03-24 NOTE — Telephone Encounter (Signed)
I did not see any labs that were overly worrisome to me.  For the most part labs look pretty normal.  They did check sed rate and ESR CRP which are chronically elevated, but actually lower than they had been in the past.  I do not think that there is anything that jumped out from the labs from the recent stay the hospital.  She was supposed to be seen on July 18 after her initial PCI (at [facility] with Laurann [place], PA), but least now she has an appointment in 10 days with Roderic Palau in the A. fib clinic.  Her prescriptions that she got out of the hospital should cover her.  Regardless, she has been seen in the hospital in the interim and if she needs refills of her current meds, would simply refill.  [name], MD

## 2021-03-24 NOTE — Telephone Encounter (Signed)
Patient had questions regarding the labs she had done while she was in the hospital. Please call

## 2021-03-24 NOTE — Telephone Encounter (Signed)
Received fax from [facility] stating she is asking for a new Rx for her oxydocone that allows her to refill before 8/13.  Her last fill date was 02/26/21 and 30 days is 03/27/21.

## 2021-03-24 NOTE — Telephone Encounter (Signed)
Called patient, she was seen at the hospital recently and would like Dr.Harding to review these labs and her recent visit. She went on to say that she wants to have all her medications refilled- they gave her a 30 day supply and she has an upcoming appointment with Roderic Palau, NP on 08/18. I advised that she could refill these if they were still helping her during this visit. Patient then states that she wants Dr.Harding to call her personally. I advised I could get her an appointment to discuss these questions/concerns that she has. Patient was advised I would send a message and see what I could do, but he was in clinic/at hospital. Will route to MD to make aware.

## 2021-03-25 ENCOUNTER — Other Ambulatory Visit: Payer: Self-pay

## 2021-03-25 ENCOUNTER — Encounter: Payer: Self-pay | Admitting: Family Medicine

## 2021-03-25 ENCOUNTER — Telehealth (INDEPENDENT_AMBULATORY_CARE_PROVIDER_SITE_OTHER): Payer: Medicare Other | Admitting: Family Medicine

## 2021-03-25 DIAGNOSIS — R509 Fever, unspecified: Secondary | ICD-10-CM | POA: Diagnosis not present

## 2021-03-25 DIAGNOSIS — R0600 Dyspnea, unspecified: Secondary | ICD-10-CM | POA: Diagnosis not present

## 2021-03-25 DIAGNOSIS — R5381 Other malaise: Secondary | ICD-10-CM | POA: Diagnosis not present

## 2021-03-25 DIAGNOSIS — R059 Cough, unspecified: Secondary | ICD-10-CM | POA: Diagnosis not present

## 2021-03-25 NOTE — Patient Instructions (Signed)
Please seek prompt in person medical evaluation right away as we discussed. Please call 911 if you are having worsening or life-threatening symptoms and/or feel that it is not safe to transport yourself for medical care.  I hope you are feeling better soon!   It was nice to meet you today. I help [facility] out with telemedicine visits on Tuesdays and Thursdays and am available for visits on those days. If you have any concerns or questions following this visit please schedule a follow up visit with your Primary Care doctor or seek care at a local urgent care clinic to avoid delays in care.

## 2021-03-25 NOTE — Progress Notes (Signed)
Virtual Visit via Telephone Note  I connected with Katherine [name] on 03/25/21 at  4:40 PM EDT by telephone and verified that I am speaking with the correct person using two identifiers.   I discussed the limitations, risks, security and privacy concerns of performing an evaluation and management service by telephone and the availability of in person appointments. I also discussed with the patient that there may be a patient responsible charge related to this service. The patient expressed understanding and agreed to proceed.  Location patient: home, [place] Location provider: work or home office Participants present for the call: patient, provider Patient did not have a visit with me in the prior 7 days to address this/these issue(s).   History of Present Illness:  Acute telemedicine visit for cough: -Onset: has intermittent chronic sinus issues -Symptoms include:  cough, "I feel like I have pneumonia", some chest pain, " I know I am running a fever", trouble breathing, using O2, uncontrollable diarrhea yesterday, doesn't feel like eating - reports all of these symptoms have worsened the last few days despite "using my oxygen all the time" -she was just in the hospital for chest pain following recent complicated cardiac course - see notes in chart -Pertinent past medical history: see below -COVID-19 vaccine status: one dose   Past Medical History:  Diagnosis Date   Anemia    Anxiety    Bilateral carotid artery stenosis    Carotid duplex 11/979: 1-91% LICA, 47-82% RICA, >95% RECA, f/u 1 yr suggested   CAD (coronary artery disease) of bypass graft 5/01; 3/'02, 8/'03, 10/'04; 1/15   PCI x 5 to SVG-D1    CAD in native artery 07/1993   3 Vessel Disease (LAD-D1 & RCA) -- CABG (Dx in setting of inferior STEMI-PTCA of RCA)   CAD S/P percutaneous coronary angioplasty    PCI to SVG-D1 insertion/native D1 x 4 = '01 -(S660 BMS 2.5 x 9 anastomosis- D1); '02 - distal overlap ACS Pixel 2.5 x 8  BMS;  '03 distal/native ISR/Thrombosis - Pixel 2.5 x 13; '04 - ISR-  Taxus 2.5 x 20 (covered all);; 1/15 - mid SVG-D1 (50% distal ISR) - Promus P 2.75 x 20 -- 2.8 mm   COPD mixed type ([place])    Followed by Dr. Lamonte Sakai "pulmonologist said no COPD"   Depression    Depression with anxiety    Diabetes mellitus type 2 in obese ([place])    Diarrhea    started after cholecystectomy and mass removed from intestine   Dyslipidemia, goal LDL below 70    08/2012: TC 137, TG 200, HDL 32!, LDL 45; on statin (followed by Dr.Deterding)   ESRD (end stage renal disease) ([place]) 1991   s/p Cadaveric Renal Transplant [facility] - Dr. Jimmy Footman)    Family history of adverse reaction to anesthesia    mom's bp dropped during/after anesthesia   Fibromyalgia    GERD (gastroesophageal reflux disease)    Glomerulonephritis, chronic, rapidly progressive 1989   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 07/1993   Rescue PTCA of RCA -- referred for CABG.   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 Servando Snare: LIMA-LAD, SVG-bifurcatingD1, SVG-rPDA   History of kidney stones    History of stroke 2012   "right eye stroke- half blind now"   Hypertension associated with diabetes ([place])    Mild aortic stenosis by prior echocardiogram 07/2019  Echo:  Mild aortic stenosis (gradients: Mean 14.3 mmHg -peak 24.9 mmHg).   Morbid obesity ([facility])    MRSA (methicillin resistant staph aureus) culture positive    OSA (obstructive sleep apnea)    no longer on CPAP or home O2, states she doesn't need now after lap band   PAD (peripheral artery disease) ([place]) 08/2013   LEA Dopplers to be read by Dr. Fletcher Anon   PAF (paroxysmal atrial fibrillation) ([place]) 06/2014   Noted on CardioNet Monitor  - --> rhythm control with Tikosyn (Dr. Rayann Heman); converted from warfarin to apixaban for anticoagulation.   Pneumonia    Recurrent boils    Bilateral Groin   Rheumatoid arthritis ([place])    Per  Patient Report; associated with OA   S/p cadaver renal transplant 1991   [facility]   Unstable angina ([place]) 5/01; 3/'02, 8/'03, 10/'04; 1/15   x 5 occurences since Inf-Post STEMI in 1994     Observations/Objective: Patient sounds cheerful and well on the phone. I do not appreciate any SOB. Speech and thought processing are grossly intact. Patient reported vitals:  Assessment and Plan:  Cough  Dyspnea, unspecified type  Malaise  Fever, unspecified fever cause  -we discussed possible serious and likely etiologies, options for evaluation and workup, limitations of telemedicine visit vs in person visit, treatment, treatment risks and precautions. Particularly given her complicated past medical history, along with reported symptoms, advised needs prompt inperson evaluation. Her PCP office was unavailable so she was given a phone visit. Advised of other options for inperson care this e ening and advise [facility] or ER. She agrees to seek care, prefer self transport and reports feels safe doing so. Did let the patient know that I only do telemedicine shifts for [place] on Tuesdays and Thursdays and advised a follow up visit with PCP or at an [facility] if has further questions or concerns.   Follow Up Instructions:  I did not refer this patient for an OV with me in the next 24 hours for this/these issue(s).  I discussed the assessment and treatment plan with the patient. The patient was provided an opportunity to ask questions and all were answered. The patient agreed with the plan and demonstrated an understanding of the instructions.   I spent 18 minutes on the date of this visit in the care of this patient. See summary of tasks completed to properly care for this patient in the detailed notes above which also included counseling of above, review of PMH, medications, allergies, evaluation of the patient and ordering and/or  instructing patient on testing and care options.     Lucretia Kern, DO

## 2021-03-26 ENCOUNTER — Telehealth: Payer: Self-pay

## 2021-03-26 NOTE — Telephone Encounter (Signed)
Katherine [name] called requesting for DR.Naaman Plummer to call her and give her Imaging results because she sates she is still in severe pain . Please advice .She did not go to [facility] imaging but went to [place].

## 2021-03-28 ENCOUNTER — Telehealth: Payer: Self-pay

## 2021-03-28 MED ORDER — OXYCODONE HCL 10 MG PO TABS: 10.0000 mg | ORAL_TABLET | Freq: Four times a day (QID) | ORAL | 0 refills | Status: DC | PRN

## 2021-03-28 NOTE — Telephone Encounter (Signed)
Oxycodone 10 mg scheduled to be released tomorrow. Patient stated she has only #2 tabs on hand. She would like it released today.   Call back phone [phone].

## 2021-03-28 NOTE — Telephone Encounter (Signed)
Changes to rx made, rx sent

## 2021-03-30 ENCOUNTER — Emergency Department (HOSPITAL_COMMUNITY): Payer: Medicare Other

## 2021-03-30 ENCOUNTER — Emergency Department (HOSPITAL_COMMUNITY)
Admission: EM | Admit: 2021-03-30 | Discharge: 2021-03-31 | Disposition: A | Discharge status: Home / Self Care | Payer: Medicare Other | Attending: Emergency Medicine | Admitting: Emergency Medicine

## 2021-03-30 ENCOUNTER — Encounter (HOSPITAL_COMMUNITY): Payer: Self-pay

## 2021-03-30 ENCOUNTER — Other Ambulatory Visit: Payer: Self-pay

## 2021-03-30 DIAGNOSIS — J45909 Unspecified asthma, uncomplicated: Secondary | ICD-10-CM | POA: Insufficient documentation

## 2021-03-30 DIAGNOSIS — W19XXXA Unspecified fall, initial encounter: Secondary | ICD-10-CM | POA: Diagnosis not present

## 2021-03-30 DIAGNOSIS — Z86018 Personal history of other benign neoplasm: Secondary | ICD-10-CM | POA: Insufficient documentation

## 2021-03-30 DIAGNOSIS — I517 Cardiomegaly: Secondary | ICD-10-CM | POA: Diagnosis not present

## 2021-03-30 DIAGNOSIS — E1151 Type 2 diabetes mellitus with diabetic peripheral angiopathy without gangrene: Secondary | ICD-10-CM | POA: Insufficient documentation

## 2021-03-30 DIAGNOSIS — I25810 Atherosclerosis of coronary artery bypass graft(s) without angina pectoris: Secondary | ICD-10-CM | POA: Insufficient documentation

## 2021-03-30 DIAGNOSIS — S0990XA Unspecified injury of head, initial encounter: Secondary | ICD-10-CM | POA: Diagnosis not present

## 2021-03-30 DIAGNOSIS — M79631 Pain in right forearm: Secondary | ICD-10-CM | POA: Diagnosis not present

## 2021-03-30 DIAGNOSIS — E1169 Type 2 diabetes mellitus with other specified complication: Secondary | ICD-10-CM | POA: Insufficient documentation

## 2021-03-30 DIAGNOSIS — M542 Cervicalgia: Secondary | ICD-10-CM | POA: Diagnosis not present

## 2021-03-30 DIAGNOSIS — R059 Cough, unspecified: Secondary | ICD-10-CM | POA: Diagnosis not present

## 2021-03-30 DIAGNOSIS — Z951 Presence of aortocoronary bypass graft: Secondary | ICD-10-CM | POA: Insufficient documentation

## 2021-03-30 DIAGNOSIS — R109 Unspecified abdominal pain: Secondary | ICD-10-CM | POA: Diagnosis present

## 2021-03-30 DIAGNOSIS — E785 Hyperlipidemia, unspecified: Secondary | ICD-10-CM | POA: Diagnosis not present

## 2021-03-30 DIAGNOSIS — M1612 Unilateral primary osteoarthritis, left hip: Secondary | ICD-10-CM | POA: Diagnosis not present

## 2021-03-30 DIAGNOSIS — I132 Hypertensive heart and chronic kidney disease with heart failure and with stage 5 chronic kidney disease, or end stage renal disease: Secondary | ICD-10-CM | POA: Insufficient documentation

## 2021-03-30 DIAGNOSIS — Z043 Encounter for examination and observation following other accident: Secondary | ICD-10-CM | POA: Diagnosis not present

## 2021-03-30 DIAGNOSIS — M79632 Pain in left forearm: Secondary | ICD-10-CM | POA: Diagnosis not present

## 2021-03-30 DIAGNOSIS — I5033 Acute on chronic diastolic (congestive) heart failure: Secondary | ICD-10-CM | POA: Insufficient documentation

## 2021-03-30 DIAGNOSIS — B9689 Other specified bacterial agents as the cause of diseases classified elsewhere: Secondary | ICD-10-CM | POA: Insufficient documentation

## 2021-03-30 DIAGNOSIS — J449 Chronic obstructive pulmonary disease, unspecified: Secondary | ICD-10-CM | POA: Diagnosis not present

## 2021-03-30 DIAGNOSIS — R296 Repeated falls: Secondary | ICD-10-CM | POA: Diagnosis not present

## 2021-03-30 DIAGNOSIS — Z7901 Long term (current) use of anticoagulants: Secondary | ICD-10-CM | POA: Diagnosis not present

## 2021-03-30 DIAGNOSIS — N3000 Acute cystitis without hematuria: Secondary | ICD-10-CM | POA: Insufficient documentation

## 2021-03-30 DIAGNOSIS — Z20822 Contact with and (suspected) exposure to covid-19: Secondary | ICD-10-CM | POA: Insufficient documentation

## 2021-03-30 DIAGNOSIS — Z955 Presence of coronary angioplasty implant and graft: Secondary | ICD-10-CM | POA: Insufficient documentation

## 2021-03-30 DIAGNOSIS — Z7984 Long term (current) use of oral hypoglycemic drugs: Secondary | ICD-10-CM | POA: Diagnosis not present

## 2021-03-30 DIAGNOSIS — R519 Headache, unspecified: Secondary | ICD-10-CM | POA: Insufficient documentation

## 2021-03-30 DIAGNOSIS — E1142 Type 2 diabetes mellitus with diabetic polyneuropathy: Secondary | ICD-10-CM | POA: Insufficient documentation

## 2021-03-30 DIAGNOSIS — W06XXXA Fall from bed, initial encounter: Secondary | ICD-10-CM | POA: Diagnosis not present

## 2021-03-30 DIAGNOSIS — M79622 Pain in left upper arm: Secondary | ICD-10-CM | POA: Diagnosis not present

## 2021-03-30 DIAGNOSIS — Z87442 Personal history of urinary calculi: Secondary | ICD-10-CM | POA: Diagnosis not present

## 2021-03-30 DIAGNOSIS — R531 Weakness: Secondary | ICD-10-CM | POA: Diagnosis not present

## 2021-03-30 DIAGNOSIS — Z79899 Other long term (current) drug therapy: Secondary | ICD-10-CM | POA: Diagnosis not present

## 2021-03-30 DIAGNOSIS — M1611 Unilateral primary osteoarthritis, right hip: Secondary | ICD-10-CM | POA: Diagnosis not present

## 2021-03-30 DIAGNOSIS — Z794 Long term (current) use of insulin: Secondary | ICD-10-CM | POA: Insufficient documentation

## 2021-03-30 DIAGNOSIS — Z87891 Personal history of nicotine dependence: Secondary | ICD-10-CM | POA: Insufficient documentation

## 2021-03-30 DIAGNOSIS — N186 End stage renal disease: Secondary | ICD-10-CM | POA: Insufficient documentation

## 2021-03-30 DIAGNOSIS — Z7902 Long term (current) use of antithrombotics/antiplatelets: Secondary | ICD-10-CM | POA: Insufficient documentation

## 2021-03-30 LAB — COMPREHENSIVE METABOLIC PANEL
ALT: 11 U/L (ref 0–44)
AST: 19 U/L (ref 15–41)
Albumin: 3.1 g/dL — ABNORMAL LOW (ref 3.5–5.0)
Alkaline Phosphatase: 94 U/L (ref 38–126)
Anion gap: 8 (ref 5–15)
BUN: 8 mg/dL (ref 8–23)
CO2: 26 mmol/L (ref 22–32)
Calcium: 9.4 mg/dL (ref 8.9–10.3)
Chloride: 103 mmol/L (ref 98–111)
Creatinine, Ser: 0.83 mg/dL (ref 0.44–1.00)
GFR, Estimated: >60 mL/min (ref >60)
Glucose, Bld: 175 mg/dL — ABNORMAL HIGH (ref 70–99)
Potassium: 3.7 mmol/L (ref 3.5–5.1)
Sodium: 137 mmol/L (ref 135–145)
Total Bilirubin: 0.8 mg/dL (ref 0.3–1.2)
Total Protein: 7.2 g/dL (ref 6.5–8.1)

## 2021-03-30 LAB — CBC WITH DIFFERENTIAL/PLATELET
Abs Immature Granulocytes: 0.03 K/uL (ref 0.00–0.07)
Basophils Absolute: 0.1 K/uL (ref 0.0–0.1)
Basophils Relative: 1 %
Eosinophils Absolute: 0.2 K/uL (ref 0.0–0.5)
Eosinophils Relative: 4 %
HCT: 36.9 % (ref 36.0–46.0)
Hemoglobin: 11.6 g/dL — ABNORMAL LOW (ref 12.0–15.0)
Immature Granulocytes: 1 %
Lymphocytes Relative: 17 %
Lymphs Abs: 1.1 K/uL (ref 0.7–4.0)
MCH: 30.4 pg (ref 26.0–34.0)
MCHC: 31.4 g/dL (ref 30.0–36.0)
MCV: 96.9 fL (ref 80.0–100.0)
Monocytes Absolute: 0.9 K/uL (ref 0.1–1.0)
Monocytes Relative: 14 %
Neutro Abs: 4 K/uL (ref 1.7–7.7)
Neutrophils Relative %: 63 %
Platelets: 290 K/uL (ref 150–400)
RBC: 3.81 MIL/uL — ABNORMAL LOW (ref 3.87–5.11)
RDW: 16 % — ABNORMAL HIGH (ref 11.5–15.5)
WBC: 6.3 K/uL (ref 4.0–10.5)
nRBC: 0 % (ref 0.0–0.2)

## 2021-03-30 LAB — RESP PANEL BY RT-PCR (FLU A&B, COVID) ARPGX2
Influenza A by PCR: NEGATIVE
Influenza B by PCR: NEGATIVE
SARS Coronavirus 2 by RT PCR: NEGATIVE

## 2021-03-30 LAB — URINALYSIS, ROUTINE W REFLEX MICROSCOPIC
Bacteria, UA: NONE SEEN
Bilirubin Urine: NEGATIVE
Glucose, UA: NEGATIVE mg/dL
Hgb urine dipstick: NEGATIVE
Ketones, ur: NEGATIVE mg/dL
Nitrite: NEGATIVE
Protein, ur: NEGATIVE mg/dL
Specific Gravity, Urine: 1.017 (ref 1.005–1.030)
pH: 5 (ref 5.0–8.0)

## 2021-03-30 LAB — CK: Total CK: 90 U/L (ref 38–234)

## 2021-03-30 MED ORDER — ACETAMINOPHEN 325 MG PO TABS
650.0000 mg | ORAL_TABLET | Freq: Once | ORAL | Status: AC
  Administered 2021-03-30: 650 mg via ORAL
  Filled 2021-03-30: qty 2

## 2021-03-30 MED ORDER — OXYCODONE HCL 5 MG PO TABS
10.0000 mg | ORAL_TABLET | Freq: Four times a day (QID) | ORAL | Status: DC | PRN
  Administered 2021-03-30: 10 mg via ORAL
  Filled 2021-03-30: qty 2

## 2021-03-30 MED ORDER — NITROFURANTOIN MONOHYD MACRO 100 MG PO CAPS: 100.0000 mg | ORAL_CAPSULE | Freq: Two times a day (BID) | ORAL | 0 refills | Status: DC

## 2021-03-30 NOTE — Discharge Instructions (Addendum)
Follow-up with your doctor to make sure you are improving.  Start the Macrobid to help with your persistent urine infection

## 2021-03-30 NOTE — ED Notes (Addendum)
Pt ambulated from room to bathroom via walker and back without dyspnea or abnormalities. Patient did walk on room air and maintained oxygenation.

## 2021-03-30 NOTE — ED Provider Notes (Signed)
Emergency Medicine Provider Triage Evaluation Note  Katherine [name] , a 65 y.o. female  was evaluated in triage.  Pt complains of multiple falls over the past week or two. Pt attributes this to medication she is on but cannot state the name of the medication. She reports falling out of bed last night and laying on the ground for several hours before getting up to call 911. Complains of head pain, neck pain, bilateral hip pain, right arm pain. Pt is anticoagulated.  Review of Systems  Positive: + headache, neck pain, arthralgias Negative: - chest pain, SOB  Physical Exam  There were no vitals taken for this visit. Gen:   Awake, no distress   Resp:  Normal effort  MSK:   Moves extremities without difficulty  Other:  Moving all extremities without difficulty. Appears somewhat confused however a&ox4. No focal neuro deficits. Bruise to forehead. + TTP to neck, right humerus/right forearm and bilateral hips.   Medical Decision Making  Medically screening exam initiated at 2:02 PM.  Appropriate orders placed.  [name] was informed that the remainder of the evaluation will be completed by another provider, this initial triage assessment does not replace that evaluation, and the importance of remaining in the ED until their evaluation is complete.  Xrays, CT, and labs obtained.    Eustaquio Maize, PA-C 03/30/21 [place], [place], DO 03/30/21 [id number]

## 2021-03-30 NOTE — ED Triage Notes (Signed)
Patient arrived by [facility] from home following multiple falls this past week. Patient states that she uses a walker and has had increased weakness. Fell last night and has been on floor for approximately 6 hours. Patient with various stages of bruises noted to arms and legs. Patient on home oxygen at 3l all the time and denies loc. Complains of neck pain and generalized extremity pain, no obvious deformity. Patient on eliquis

## 2021-03-30 NOTE — ED Provider Notes (Signed)
[facility] EMERGENCY DEPARTMENT Provider Note   CSN: [id number] Arrival date & time: 03/30/21  1354     History Chief complaint: Fall  Katherine [name] is a 65 y.o. female.  HPI  Patient presents to the ED for evaluation after a fall.  Patient she has had a few episodes of falling this past week.  She has been using her walker but has been feeling weaker than usual.  Patient states she has history of arthritis all over because of her chronic steroid use associated with her remote kidney transplant.  Patient states that she had an episode where she fell last night from her bed onto the floor.  She was lying on the floor for 6 hours because she was not able to get up.  She had to call EMS.  She is complaining of pain in her extremities and in her neck.  She also had pain in her abdomen from where the end table was pushing on her abdomen.  She denies any recent fevers or chills.  No chest pain or shortness of breath.  Past Medical History:  Diagnosis Date   Anemia    Anxiety    Bilateral carotid artery stenosis    Carotid duplex 10/5327: 9-24% LICA, 26-83% RICA, >41% RECA, f/u 1 yr suggested   CAD (coronary artery disease) of bypass graft 5/01; 3/'02, 8/'03, 10/'04; 1/15   PCI x 5 to SVG-D1    CAD in native artery 07/1993   3 Vessel Disease (LAD-D1 & RCA) -- CABG (Dx in setting of inferior STEMI-PTCA of RCA)   CAD S/P percutaneous coronary angioplasty    PCI to SVG-D1 insertion/native D1 x 4 = '01 -(S660 BMS 2.5 x 9 anastomosis- D1); '02 - distal overlap ACS Pixel 2.5 x 8  BMS; '03 distal/native ISR/Thrombosis - Pixel 2.5 x 13; '04 - ISR-  Taxus 2.5 x 20 (covered all);; 1/15 - mid SVG-D1 (50% distal ISR) - Promus P 2.75 x 20 -- 2.8 mm   COPD mixed type (HCC)    Followed by Dr. Lamonte Sakai "pulmonologist said no COPD"   Depression    Depression with anxiety    Diabetes mellitus type 2 in obese ([place])    Diarrhea    started after cholecystectomy and mass removed from  intestine   Dyslipidemia, goal LDL below 70    08/2012: TC 137, TG 200, HDL 32!, LDL 45; on statin (followed by Dr.Deterding)   ESRD (end stage renal disease) ([place]) 1991   s/p Cadaveric Renal Transplant [facility] - Dr. Jimmy Footman)    Family history of adverse reaction to anesthesia    mom's bp dropped during/after anesthesia   Fibromyalgia    GERD (gastroesophageal reflux disease)    Glomerulonephritis, chronic, rapidly progressive 1989   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 07/1993   Rescue PTCA of RCA -- referred for CABG.   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 Servando Snare: LIMA-LAD, SVG-bifurcatingD1, SVG-rPDA   History of kidney stones    History of stroke 2012   "right eye stroke- half blind now"   Hypertension associated with diabetes ([place])    Mild aortic stenosis by prior echocardiogram 07/2019   Echo:  Mild aortic stenosis (gradients: Mean 14.3 mmHg -peak 24.9 mmHg).   Morbid obesity (HCC)    MRSA (methicillin resistant staph aureus) culture positive    OSA (obstructive sleep  apnea)    no longer on CPAP or home O2, states she doesn't need now after lap band   PAD (peripheral artery disease) ([place]) 08/2013   LEA Dopplers to be read by Dr. Fletcher Anon   PAF (paroxysmal atrial fibrillation) ([place]) 06/2014   Noted on CardioNet Monitor  - --> rhythm control with Tikosyn (Dr. Rayann Heman); converted from warfarin to apixaban for anticoagulation.   Pneumonia    Recurrent boils    Bilateral Groin   Rheumatoid arthritis ([place])    Per Patient Report; associated with OA   S/p cadaver renal transplant 1991   [facility]   Unstable angina ([place]) 5/01; 3/'02, 8/'03, 10/'04; 1/15   x 5 occurences since Inf-Post STEMI in 1994    Patient Active Problem List   Diagnosis Date Noted   Viral syndrome 03/21/2021   Pure hypercholesterolemia    Coronary stent restenosis due to scar tissue    Unstable angina ([place]) 02/19/2021   Acute  on chronic diastolic CHF (congestive heart failure) ([place]) 01/29/2021   Acute CHF (congestive heart failure) ([place]) 01/29/2021   Acute on chronic systolic CHF (congestive heart failure) ([place]) 01/29/2021   Non-ST elevation (NSTEMI) myocardial infarction [facility])    Chronic respiratory failure with hypoxia ([place]) 01/01/2021   Flu vaccine need 08/13/2020   Need for pneumococcal vaccination 08/13/2020   Visit for screening mammogram 08/13/2020   Candidal skin infection 08/13/2020   GAD (generalized anxiety disorder) 08/13/2020   Chronic bronchitis, mucopurulent ([place]) 08/24/2019   Current chronic use of systemic steroids 07/23/2019   OSA (obstructive sleep apnea)    Severe episode of recurrent major depressive disorder, with psychotic features ([place]) 02/16/2019   Obstructive chronic bronchitis without exacerbation ([place]) 11/03/2018   Long term (current) use of anticoagulants 04/28/2018   Renal transplant, status post 01/28/2018   LBBB (left bundle branch block) 10/07/2017   Primary osteoarthritis of right knee 08/18/2017   Shortness of breath 06/18/2017   Mild aortic stenosis by prior echocardiogram 01/28/2017   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   Deficiency anemia 10/14/2016   Normocytic anemia 10/13/2016   Fibromyalgia 03/30/2016   Other spondylosis with radiculopathy, lumbar region 03/30/2016   Type 2 diabetes mellitus with peripheral neuropathy ([place]) 03/30/2016   Paroxysmal atrial fibrillation ([place]); CHA2DS2VASc score F, HTN, CAD, CVA = 5 06/28/2014   Hypertension associated with diabetes ([place])    Chest pain 08/23/2013   PAD (peripheral artery disease) ([place]) 08/17/2013   Stenosis of right carotid artery without infarction 10/08/2012   Dyslipidemia, goal LDL below 70 10/08/2012    Class: Diagnosis of   Mitral annular calcification 10/08/2012   Hypomagnesemia 06/13/2012   Renal transplant disorder 06/12/2012   Chronic allergic rhinitis 04/29/2011   Extrinsic asthma 09/09/2007   GERD 09/09/2007    COUGH, CHRONIC 09/09/2007   Morbid obesity - s/p Lap Band 9/'05 05/07/2004    Class: Diagnosis of   Hx of CABG 09/07/1993    Class: History of   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 07/1993   CAD in native artery 07/1993   ESRD (end stage renal disease) ([place]) 1991    Past Surgical History:  Procedure Laterality Date   ABDOMINAL AORTOGRAM N/A 04/21/2018   Procedure: ABDOMINAL AORTOGRAM;  Surgeon: [name], MD;  Location: [place] CV LAB;  Service: Cardiovascular;  Laterality: N/A;   CATHETER REMOVAL     CHOLECYSTECTOMY N/A 10/29/2014   Procedure: LAPAROSCOPIC CHOLECYSTECTOMY WITH INTRAOPERATIVE CHOLANGIOGRAM;  Surgeon: Excell Seltzer, MD;  Location: [facility] ORS;  Service: General;  Laterality: N/A;   CORONARY ANGIOPLASTY  1994   x5   CORONARY ARTERY BYPASS GRAFT  1995   LIMA-LAD, SVG-RPDA, SVG-D1   CORONARY STENT INTERVENTION N/A 02/20/2021   Procedure: CORONARY STENT INTERVENTION;  Surgeon: [name], MD;  Location: [place] CV LAB;  Service: Cardiovascular;  Laterality: N/A;   ESOPHAGOGASTRODUODENOSCOPY N/A 10/15/2016   Procedure: ESOPHAGOGASTRODUODENOSCOPY (EGD);  Surgeon: Wilford Corner, MD;  Location: [facility] ENDOSCOPY;  Service: Endoscopy;  Laterality: N/A;   I & D EXTREMITY Right 01/29/2018   Procedure: IRRIGATION AND DEBRIDEMENT THUMB;  Surgeon: Dayna Barker, MD;  Location: [place];  Service: Plastics;  Laterality: Right;   INCISE AND DRAIN ABCESS     INTRAVASCULAR PRESSURE WIRE/FFR STUDY N/A 02/20/2021   Procedure: INTRAVASCULAR PRESSURE WIRE/FFR STUDY;  Surgeon: [name], MD;  Location: [place] CV LAB;  Service: Cardiovascular;  Laterality: N/A;   KIDNEY TRANSPLANT  1991   KNEE ARTHROSCOPY WITH LATERAL MENISECTOMY Left 12/03/2017   Procedure: LEFT KNEE ARTHROSCOPY WITH LATERAL MENISECTOMY;  Surgeon: Earlie Server, MD;  Location: [facility];  Service: Orthopedics;  Laterality: Left;   LAPAROSCOPIC GASTRIC BANDING  04/2004;  10/'09, 2/'10   Port Replacement x 2   LEFT HEART CATH AND CORONARY ANGIOGRAPHY N/A 02/20/2021   Procedure: LEFT HEART CATH AND CORONARY ANGIOGRAPHY;  Surgeon: [name], MD;  Location: [place] CV LAB;  Service: Cardiovascular;  Laterality: N/A;   LEFT HEART CATH AND CORS/GRAFTS ANGIOGRAPHY N/A 04/21/2018   Procedure: LEFT HEART CATH AND CORS/GRAFTS ANGIOGRAPHY;  Surgeon: [name], MD;  Location: [place] CV LAB;  Ost-Prox LAD 50% - proxLAD (pre & post D1) 100% CTO. Cx - patent, small OM1 (stable ~ ostial OM1 90%, too small for PCI) & 2 small LPL; Ost-distal RCA 100% CTO.  LIMA-LAD (not injected); SVG-dRCA patent, SVG-D1 - insertion stent ~20% ISR - Severe R CFA disease w/ focal Sub TO   LEFT HEART CATH AND CORS/GRAFTS ANGIOGRAPHY  5/'01, 3/'02, 8/'03, 10/'04; 1/'15   08/22/2013: LAD & RCA 100%; LIMA-LAD & SVG-rPDA patent; Cx-- OM1 60%, OM2 ostial ~50%; SVG-D1 - 80% mid, 50% distal ISR --PCI   LEFT HEART CATH AND CORS/GRAFTS ANGIOGRAPHY N/A 01/31/2021   Procedure: LEFT HEART CATH AND CORS/GRAFTS ANGIOGRAPHY;  Surgeon: Jolaine Artist, MD;  Location: [place] CV LAB;  Service: Cardiovascular;  Laterality: N/A;   LEFT HEART CATHETERIZATION WITH CORONARY/GRAFT ANGIOGRAM N/A 08/23/2013   Procedure: LEFT HEART CATHETERIZATION WITH Beatrix Fetters;  Surgeon: Wellington Hampshire, MD;  Location: [place] CATH LAB;  Service: Cardiovascular;  Laterality: N/A;   Lower Extremity Arterial Dopplers  08/2013   ABI: R 0.96, L 1.04   MULTIPLE TOOTH EXTRACTIONS  age 27   NM MYOVIEW LTD  03/2016   EF 62%. LOW RISK. C/W prior MI - no Ischemia. Apical hypokinesis.   PERCUTANEOUS CORONARY STENT INTERVENTION (PCI-S)  5/'01, 3/'02, 8/'03, 10/'04;   '01 - S660 BMS 2.5 x 9 - dSVG-D1 into D1; '02- post-stent stenosis - 2.5 x 8 Pixel BMS; '8\03: ISR/Thrombosis into native D1 - AngioJet, 2.5 x 13 Pixel; '04 - ISR 95% - covered stented area with Taxus DES 2.5 mm x 20 (2.88)   PERCUTANEOUS CORONARY STENT  INTERVENTION (PCI-S)  08/23/2013   Procedure: PERCUTANEOUS CORONARY STENT INTERVENTION (PCI-S);  Surgeon: Wellington Hampshire, MD;  Location[facility] CATH LAB;  Service: Cardiovascular;;mid SVG-D1 80%; distal stent ~50% ISR; Promus [place] DES 2.75 mm xc 20 mm (2.8 mm)   PORT-A-CATH  REMOVAL     kidney   TRANSTHORACIC ECHOCARDIOGRAM  07/2019   EF 55 to 60%.  No LVH.  Paradoxical septal wall motion-post CABG.  GRII DD.  Normal RV size and function.  Mild bilateral atrial dilation.  Moderate MAC.  Trace MR.  Mild aortic stenosis (gradients: Mean 14.3 mmHg -peak 24.9 mmHg).   TUBAL LIGATION     wrist fistula repair Left    dialysis for one year     OB History   No obstetric history on file.     Family History  Problem Relation Age of Onset   Cancer Mother        liver   Heart disease Father    Cancer Father        colon   Arrhythmia Brother        Atrial Fibrillation   Arrhythmia Paternal Aunt        Atrial Fibrillation    Social History   Tobacco Use   Smoking status: Former    Packs/day: 1.00    Years: 30.00    Pack years: 30.00    Types: Cigarettes    Quit date: 08/17/2002    Years since quitting: 18.6   Smokeless tobacco: Never  Vaping Use   Vaping Use: Never used  Substance Use Topics   Alcohol use: No   Drug use: No    Home Medications Prior to Admission medications   Medication Sig Start Date End Date Taking? Authorizing Provider  acetaminophen (TYLENOL) 500 MG tablet Take 1,500 mg by mouth 2 (two) times daily.    [provider]  albuterol (VENTOLIN HFA) 108 (90 Base) MCG/ACT inhaler TAKE 2 PUFFS BY MOUTH EVERY 6 HOURS AS NEEDED FOR WHEEZE OR SHORTNESS OF BREATH Patient taking differently: Inhale 2 puffs into the lungs every 6 (six) hours as needed for wheezing or shortness of breath. 02/10/21   Byrum, Rose Fillers, MD  amoxicillin (AMOXIL) 250 MG capsule Take 1 capsule (250 mg total) by mouth 3 (three) times daily. 03/12/21   Meredith Staggers, MD  arformoterol  (BROVANA) 15 MCG/2ML NEBU Take 2 mLs (15 mcg total) by nebulization 2 (two) times daily. 08/30/19   Collene Gobble, MD  azaTHIOprine (IMURAN) 50 MG tablet Take 125 mg by mouth See admin instructions. Take 2 1/2 tablets (125 mg) by mouth daily at 3pm    [provider]  B-D ULTRAFINE III SHORT PEN 31G X 8 MM MISC See admin instructions. 11/06/19   [provider]  Blood Glucose Monitoring Suppl (ONE TOUCH ULTRA 2) w/Device KIT 2 (two) times daily. 04/08/20   [provider]  budesonide (PULMICORT) 0.5 MG/2ML nebulizer solution Take 2 mLs (0.5 mg total) by nebulization 2 (two) times daily. 08/24/19   [name], MD  calcitRIOL (ROCALTROL) 0.25 MCG capsule Take 0.25 mcg by mouth every 3 (three) days.     [provider]  cetirizine (ZYRTEC) 10 MG tablet Take 10 mg by mouth daily.    [provider]  clopidogrel (PLAVIX) 75 MG tablet Take 1 tablet (75 mg total) by mouth daily. 02/22/21   Cheryln Manly, NP  clotrimazole (MYCELEX) 10 MG troche Take 1 tablet (10 mg total) by mouth 5 (five) times daily as needed (thrush). 02/03/21   Danford, Suann Larry, MD  cyclobenzaprine (FLEXERIL) 10 MG tablet TAKE 1 TO 2 TABS BY MOUTH DAILY. TAKE 2 TAB DAILY AT BEDTIME, MAY ALSO TAKE 1 TABLET BY MOUTH AT NOON AS NEEDED FOR  MUSCLE SPASMS Patient taking differently: Take 20 mg by mouth daily at 12 noon. 01/30/21   Meredith Staggers, MD  diclofenac Sodium (VOLTAREN) 1 % GEL Apply 2 g topically 4 (four) times daily. 03/22/21   Debbe Odea, MD  diltiazem (CARDIZEM) 30 MG tablet take 1 tablet every 4 hours AS NEEDED for afib heart rate >100 as long as top blood pressure >100. Patient taking differently: Take 30 mg by mouth See admin instructions. take 1 tablet (30 mg) every 4 hours AS NEEDED for afib heart rate >100 as long as top blood pressure >100. 03/04/21   Sherran Needs, NP  dofetilide (TIKOSYN) 250 MCG capsule Take 1 capsule (250 mcg total) by mouth 2 (two) times daily.  03/04/21   Sherran Needs, NP  DULoxetine (CYMBALTA) 30 MG capsule TAKE 2 CAPSULES (60 MG TOTAL) BY MOUTH EVERY MORNING. Patient taking differently: Take 60 mg by mouth every morning. 10/30/20   Bayard Hugger, NP  ELIQUIS 5 MG TABS tablet TAKE 1 TABLET BY MOUTH TWICE A DAY (TO REPLACE WARFARIN) Patient taking differently: Take 5 mg by mouth 2 (two) times daily. 10/02/20   [name], MD  ferrous sulfate 325 (65 FE) MG tablet Take 325 mg by mouth daily with breakfast.    [provider]  fluticasone (FLONASE) 50 MCG/ACT nasal spray PLACE 2 SPRAYS INTO BOTH NOSTRILS 2 TIMES DAILY Patient taking differently: Place 2 sprays into both nostrils 2 (two) times daily. 12/17/20   Collene Gobble, MD  furosemide (LASIX) 40 MG tablet Take 1 tablet (40 mg total) by mouth daily. 07/14/20   Dwyane Dee, MD  glimepiride (AMARYL) 4 MG tablet Take 4 mg by mouth daily. 02/03/20   [provider]  insulin aspart (NOVOLOG FLEXPEN) 100 UNIT/ML FlexPen Inject 14 Units into the skin 3 (three) times daily after meals.    [provider]  Insulin Glargine (BASAGLAR KWIKPEN) 100 UNIT/ML SOPN Inject 12 Units into the skin daily before breakfast.    [provider]  isosorbide mononitrate (IMDUR) 60 MG 24 hr tablet Take 1 tablet (60 mg total) by mouth daily. 03/22/21   Debbe Odea, MD  lamoTRIgine (LAMICTAL) 25 MG tablet TAKE 1 TABLET BY MOUTH EVERYDAY AT BEDTIME Patient taking differently: Take 25 mg by mouth at bedtime. 03/05/21   Meredith Staggers, MD  Lancets [facility] DELICA PLUS [id number]) MISC daily. as directed 03/26/20   [provider]  LORazepam (ATIVAN) 1 MG tablet Take 1 tablet (1 mg total) by mouth 2 (two) times daily. 03/20/21   [name], MD  metFORMIN (GLUCOPHAGE) 500 MG tablet Take 1 tablet (500 mg total) by mouth 2 (two) times daily. 02/22/21   Burnell Blanks, MD  metoprolol tartrate (LOPRESSOR) 100 MG tablet Take 1 tablet (100 mg total) by mouth  2 (two) times daily. Appointment Required For Further Refills [phone] 03/22/21   Debbe Odea, MD  montelukast (SINGULAIR) 10 MG tablet TAKE 1 TABLET BY MOUTH EVERYDAY AT BEDTIME Patient taking differently: Take 10 mg by mouth at bedtime. 02/10/21   Collene Gobble, MD  nitroGLYCERIN (NITROSTAT) 0.4 MG SL tablet Place 1 tablet (0.4 mg total) under the tongue every 5 (five) minutes as needed for chest pain. 02/21/21   Burnell Blanks, MD  ONETOUCH ULTRA test strip USE AS DIRECTED 3 TIMES DAILY TO CHECK BLOOD SUGAR 11/04/19   [provider]  Oxycodone HCl 10 MG TABS Take 1 tablet (10 mg total) by mouth  4 (four) times daily as needed. 03/28/21   Meredith Staggers, MD  OXYGEN Inhale 3 L into the lungs continuous.    [provider]  pantoprazole (PROTONIX) 40 MG tablet Take 1 tablet (40 mg total) by mouth daily. 02/21/21 05/22/21  Cheryln Manly, NP  predniSONE (DELTASONE) 5 MG tablet Take 5 mg by mouth every morning. 06/19/19   [provider]  promethazine (PHENERGAN) 25 MG tablet Take 1 tablet (25 mg total) by mouth at bedtime as needed for nausea. For nausea and sleep 07/14/20   Dwyane Dee, MD  rosuvastatin (CRESTOR) 40 MG tablet Take 1 tablet (40 mg total) by mouth daily. 02/03/21   Danford, Suann Larry, MD  sertraline (ZOLOFT) 100 MG tablet Take 100 mg by mouth every morning. 06/12/20   [provider]    Allergies    Tetracycline, Niacin, Niaspan [niacin er], Sulfa antibiotics, Sulfonamide derivatives, Codeine, Erythromycin, Hydromorphone hcl, Morphine and related, Nalbuphine, Sulfasalazine, and Tape  Review of Systems   Review of Systems  All other systems reviewed and are negative.  Physical Exam Updated Vital Signs BP (!) 156/71   Pulse 92   Temp 98.5 F (36.9 C) (Oral)   Resp (!) 22   SpO2 95%   Physical Exam Vitals and nursing note reviewed.  Constitutional:      Appearance: She is well-developed. She is not diaphoretic.      Comments: Elevated BMI  HENT:     Head: Normocephalic and atraumatic.     Right Ear: External ear normal.     Left Ear: External ear normal.  Eyes:     General: No scleral icterus.       Right eye: No discharge.        Left eye: No discharge.     Conjunctiva/sclera: Conjunctivae normal.  Neck:     Trachea: No tracheal deviation.  Cardiovascular:     Rate and Rhythm: Normal rate and regular rhythm.  Pulmonary:     Effort: Pulmonary effort is normal. No respiratory distress.     Breath sounds: Normal breath sounds. No stridor. No wheezing or rales.  Abdominal:     General: Bowel sounds are normal. There is no distension.     Palpations: Abdomen is soft.     Tenderness: There is no abdominal tenderness. There is no guarding or rebound.  Musculoskeletal:        General: Tenderness present. No deformity.     Cervical back: Neck supple.     Comments: Tenderness palpation bilateral upper extremities and lower extremities, no gross deformity, no edema, no effusions  Skin:    General: Skin is warm and dry.     Findings: Bruising present. No rash.     Comments: Soft tissue bruising noted on extremities in various stages  Neurological:     General: No focal deficit present.     Mental Status: She is alert.     Cranial Nerves: No cranial nerve deficit (no facial droop, extraocular movements intact, no slurred speech).     Sensory: No sensory deficit.     Motor: No abnormal muscle tone or seizure activity.     Coordination: Coordination normal.  Psychiatric:        Mood and Affect: Mood normal.    ED Results / Procedures / Treatments   Labs (all labs ordered are listed, but only abnormal results are displayed) Labs Reviewed  COMPREHENSIVE METABOLIC PANEL - Abnormal; Notable for the following components:  Result Value   Glucose, Bld 175 (*)    Albumin 3.1 (*)    All other components within normal limits  CBC WITH DIFFERENTIAL/PLATELET - Abnormal; Notable for the following  components:   RBC 3.81 (*)    Hemoglobin 11.6 (*)    RDW 16.0 (*)    All other components within normal limits  URINALYSIS, ROUTINE W REFLEX MICROSCOPIC - Abnormal; Notable for the following components:   Color, Urine AMBER (*)    APPearance HAZY (*)    Leukocytes,Ua MODERATE (*)    All other components within normal limits  RESP PANEL BY RT-PCR (FLU A&B, COVID) [id number]  URINE CULTURE  CK    EKG None  Radiology DG Forearm Left  Result Date: 03/30/2021 CLINICAL DATA:  Pain.  Patient reports multiple falls. EXAM: LEFT FOREARM - 2 VIEW COMPARISON:  None. FINDINGS: No acute fracture. Chronic deformity of the proximal radius is likely postsurgical. Normal ulnar trochlear alignment. Normal wrist alignment. Surgical clips in the soft tissues with tubular soft tissue calcifications, possibly related to vascular access for dialysis. 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 IMPRESSION: 1. No acute fracture of the forearm. 2. Chronic deformity of the proximal radius is likely in part postsurgical. 3. Tubular soft tissue calcifications with multiple surgical clips, likely related to prior vascular access for dialysis. Electronically Signed   By: Keith Rake M.D.   On: 03/30/2021 19:54   DG Forearm Right  Result Date: 03/30/2021 CLINICAL DATA:  65 year old female with arm pain after a fall EXAM: RIGHT FOREARM - 2 VIEW COMPARISON:  None. FINDINGS: There is no evidence of fracture or other focal bone lesions. No focal soft tissue swelling. Small calcifications within the soft tissues, nonspecific. IMPRESSION: Negative. Electronically Signed   By: Corrie Mckusick D.O.   On: 03/30/2021 14:54   CT Head Wo Contrast  Result Date: 03/30/2021 CLINICAL DATA:  Multiple falls, neck pain. EXAM: CT HEAD WITHOUT CONTRAST CT CERVICAL SPINE WITHOUT CONTRAST TECHNIQUE: Multidetector CT imaging of the head and cervical spine was performed following the standard protocol without intravenous contrast.  Multiplanar CT image reconstructions of the cervical spine were also generated. COMPARISON:  None. FINDINGS: CT HEAD FINDINGS Brain: No hydrocephalus. Mild chronic small vessel ischemic changes within the bilateral periventricular and subcortical white matter regions. No mass, hemorrhage, edema or other evidence of acute parenchymal abnormality. No extra-axial hemorrhage Vascular: Chronic calcified atherosclerotic changes of the large vessels at the skull base. No unexpected hyperdense vessel. Skull: Normal. Negative for fracture or focal lesion. Sinuses/Orbits: No acute finding. Other: None. CT CERVICAL SPINE FINDINGS Alignment: No evidence of acute vertebral body subluxation. Skull base and vertebrae: Characterization of osseous detail is limited by patient motion artifact, but there is no fracture line or displaced fracture fragment identified. Facet joints appear normally aligned throughout. Soft tissues and spinal canal: No prevertebral fluid or swelling. No visible canal hematoma. Disc levels: Mild degenerative spondylosis within the mid/lower cervical spine. No more than mild central canal stenosis at any level. Upper chest: Ground-glass opacities at the lung apices, incompletely imaged. Other: None. IMPRESSION: 1. No acute intracranial abnormality. No intracranial mass, hemorrhage or edema. No skull fracture. Mild chronic small vessel ischemic changes within the white matter. 2. No fracture or acute subluxation within the cervical spine, with mild study limitations detailed above. Mild degenerative change within the mid/lower cervical spine. 3. Ground-glass opacities at the lung apices, incompletely imaged. Differential includes atypical pneumonias such as viral or fungal, interstitial pneumonias, edema related  to volume overload/CHF, chronic interstitial diseases hypersensitivity pneumonitis, and respiratory bronchiolitis. Electronically Signed   By: Franki Cabot M.D.   On: 03/30/2021 15:05   CT Cervical  Spine Wo Contrast  Result Date: 03/30/2021 CLINICAL DATA:  Multiple falls, neck pain. EXAM: CT HEAD WITHOUT CONTRAST CT CERVICAL SPINE WITHOUT CONTRAST TECHNIQUE: Multidetector CT imaging of the head and cervical spine was performed following the standard protocol without intravenous contrast. Multiplanar CT image reconstructions of the cervical spine were also generated. COMPARISON:  None. FINDINGS: CT HEAD FINDINGS Brain: No hydrocephalus. Mild chronic small vessel ischemic changes within the bilateral periventricular and subcortical white matter regions. No mass, hemorrhage, edema or other evidence of acute parenchymal abnormality. No extra-axial hemorrhage Vascular: Chronic calcified atherosclerotic changes of the large vessels at the skull base. No unexpected hyperdense vessel. Skull: Normal. Negative for fracture or focal lesion. Sinuses/Orbits: No acute finding. Other: None. CT CERVICAL SPINE FINDINGS Alignment: No evidence of acute vertebral body subluxation. Skull base and vertebrae: Characterization of osseous detail is limited by patient motion artifact, but there is no fracture line or displaced fracture fragment identified. Facet joints appear normally aligned throughout. Soft tissues and spinal canal: No prevertebral fluid or swelling. No visible canal hematoma. Disc levels: Mild degenerative spondylosis within the mid/lower cervical spine. No more than mild central canal stenosis at any level. Upper chest: Ground-glass opacities at the lung apices, incompletely imaged. Other: None. IMPRESSION: 1. No acute intracranial abnormality. No intracranial mass, hemorrhage or edema. No skull fracture. Mild chronic small vessel ischemic changes within the white matter. 2. No fracture or acute subluxation within the cervical spine, with mild study limitations detailed above. Mild degenerative change within the mid/lower cervical spine. 3. Ground-glass opacities at the lung apices, incompletely imaged.  Differential includes atypical pneumonias such as viral or fungal, interstitial pneumonias, edema related to volume overload/CHF, chronic interstitial diseases hypersensitivity pneumonitis, and respiratory bronchiolitis. Electronically Signed   By: Franki Cabot M.D.   On: 03/30/2021 15:05   DG Chest Portable 1 View  Result Date: 03/30/2021 CLINICAL DATA:  Cough. EXAM: PORTABLE CHEST 1 VIEW COMPARISON:  Chest x-rays dated 03/21/2021, 02/19/2021 and 07/11/2020. FINDINGS: Stable cardiomegaly. 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 Coarse interstitial markings are again seen bilaterally, stable compared to multiple prior studies. No confluent opacity to suggest a superimposed pneumonia. No pleural effusion or pneumothorax. IMPRESSION: 1. No acute findings. Stable cardiomegaly. Stable diffuse interstitial prominence indicating chronic mild CHF and/or chronic interstitial lung disease. 2. No evidence of pneumonia or pulmonary edema. Electronically Signed   By: Franki Cabot M.D.   On: 03/30/2021 19:49   DG Humerus Left  Result Date: 03/30/2021 CLINICAL DATA:  Pain. Multiple falls this week. Increased weakness. EXAM: LEFT HUMERUS - 2+ VIEW COMPARISON:  None. FINDINGS: The cortical margins of the humerus are intact. There is no evidence of fracture or other focal bone lesions. Shoulder alignment is maintained. Elbow alignment is not well assessed on provided views. Soft tissues are unremarkable. IMPRESSION: Negative radiographs of the humerus.  No evidence of fracture. Electronically Signed   By: Keith Rake M.D.   On: 03/30/2021 19:52   DG Humerus Right  Result Date: 03/30/2021 CLINICAL DATA:  65 year old female after a fall EXAM: RIGHT HUMERUS - 2+ VIEW COMPARISON:  None. FINDINGS: There is no evidence of fracture or other focal bone lesions. Soft tissues are unremarkable. IMPRESSION: Negative. Electronically Signed   By: Corrie Mckusick D.O.   On: 03/30/2021 14:53  DG HIP  UNILAT WITH PELVIS 2-3 VIEWS LEFT  Result Date: 03/30/2021 CLINICAL DATA:  Patient arise from home following multiple falls. EXAM: DG HIP (WITH OR WITHOUT PELVIS) 2-3V LEFT COMPARISON:  None. FINDINGS: Degenerative changes in the hip with loss of joint space. No fracture or dislocation. IMPRESSION: Degenerative changes.  No fracture or dislocation. Electronically Signed   By: Dorise Bullion III M.D.   On: 03/30/2021 15:00   DG HIP UNILAT WITH PELVIS 2-3 VIEWS RIGHT  Result Date: 03/30/2021 CLINICAL DATA:  65 year old female with fall EXAM: DG HIP (WITH OR WITHOUT PELVIS) 2-3V RIGHT COMPARISON:  None. FINDINGS: Bony pelvic ring appears intact. No acute displaced fracture. Surgical changes of the left anatomic pelvis. Bilateral hips projects normally over the acetabula. Degenerative changes of the left greater than right hip. Vascular calcifications of the proximal femoral vasculature and of the iliac segment. Unremarkable proximal right femur IMPRESSION: Negative for acute bony abnormality. Osteoarthritis Atherosclerosis Electronically Signed   By: Corrie Mckusick D.O.   On: 03/30/2021 14:56    Procedures Procedures   Medications Ordered in ED Medications  oxyCODONE (Oxy IR/ROXICODONE) immediate release tablet 10 mg (10 mg Oral Given 03/30/21 2057)    ED Course  I have reviewed the triage vital signs and the nursing notes.  Pertinent labs & imaging results that were available during my care of the patient were reviewed by me and considered in my medical decision making (see chart for details).  Clinical Course as of 03/30/21 2251  Sun Mar 30, 2021  1409 SpO2(!): 83 % Prior to being placed on her usual 3L O2 [MV]  1858 Xrays without fx  [JK]  8338 25  [JK]  1858 02 sat on home 3l [JK]  1924 Monthly 10 mg oxycodone rx [JK]  2044 DG Forearm Left No fracture [JK]  2045 DG Humerus Left No fracture [JK]  2045 Chest x-ray with stable diffuse interstitial prominence [JK]  2248 Patient was  able to walk around the ED using her walker without assistance. [JK]    Clinical Course User Index [JK] Dorie Rank, MD [MV] Eustaquio Maize, PA-C   MDM Rules/Calculators/A&P                           Patient presented to the ED for evaluation after a fall.  Patient has had some difficulty with her gait recently.  Patient is on chronic pain medications.  Patient did have an episode of hypoxia but she is normally on oxygen when she was placed back on her supplemental oxygen she returned back to her baseline.  ED work-up is reassuring.  No signs of fractures on her x-rays.  Head CT and C-spine CT are negative.  Patient's laboratory test do not show signs of anemia or dehydration.  She is COVID and flu negative.  No signs of rhabdomyolysis.  Urinalysis does suggest urinary tract infection.  Patient states she has been on antibiotics for UTI but only has 2 doses of amoxicillin left.  She does not feel like her symptoms have completely resolved.  We will send off her urine culture.  Will add on a urine culture.  To start her on Macrobid Final Clinical Impression(s) / ED Diagnoses Final diagnoses:  Acute cystitis without hematuria  Fall, initial encounter    Rx / DC Orders ED Discharge Orders     None        Dorie Rank, MD 03/30/21 2252

## 2021-04-01 ENCOUNTER — Other Ambulatory Visit: Payer: Self-pay | Admitting: Emergency Medicine

## 2021-04-02 ENCOUNTER — Ambulatory Visit (INDEPENDENT_AMBULATORY_CARE_PROVIDER_SITE_OTHER): Payer: Medicare Other | Admitting: *Deleted

## 2021-04-02 DIAGNOSIS — I509 Heart failure, unspecified: Secondary | ICD-10-CM

## 2021-04-02 DIAGNOSIS — E1142 Type 2 diabetes mellitus with diabetic polyneuropathy: Secondary | ICD-10-CM

## 2021-04-02 NOTE — Patient Instructions (Signed)
Visit Katherine [name], it was nice talking with you today.   Please read over the attached information, and start now to begin monitoring and writing down on paper your weights each day and your blood sugars  I have asked the Pharmacy team to contact you to review your medications and talk to you about how you manage your medications at home- please listen out for a call from the pharmacy team   I look forward to talking to you again for an update on Monday April 07, 2021 at 3:30 pm- please be listening out for my call that day.  I will call as close to 3:30 pm as possible.   If you need to cancel or re-schedule our telephone visit, please call [phone] and one of our care guides will be happy to assist you.   I look forward to hearing about your progress.   Please don't hesitate to contact me if I can be of assistance to you before our next scheduled telephone appointment.   Oneta Rack, RN, BSN, [facility] RN Care Coordination- [place] [phone]: direct office [phone]: mobile   PATIENT GOALS:  Goals Addressed             This Visit's Progress    Monitor and Manage My Blood Sugar-Diabetes Type 2   Not on track    Timeframe:  Long-Range Goal Priority:  Medium Start Date:           03/13/21                  Expected End Date:      03/13/22                 Follow Up Date 04/07/21    Please schedule an appointment with Dr Ronnald Ramp- he has not had a visit with you since December and you have had multiple hospital visits since your last visit with Dr. Ronnald Ramp Please resume checking and writing down blood sugars at home 2-3 times per day; check blood sugar more often if I feel it is too high or too low or if I feel sick Take all medications as prescribed: I have again asked the Pharmacy team at Dr. Ronnald Ramp' office to contact you to review your medications: please listen for a call from the pharmacy  Please write your blood sugars from home  down on paper so we can review these during our phone call visits   Why is this important?   Checking your blood sugar at home helps to keep it from getting very high or very low.  Writing the results in a diary or log helps the doctor know how to care for you.  Your blood sugar log should have the time, date and the results.  Also, write down the amount of insulin or other medicine that you take.  Other information, like what you ate, exercise done and how you were feeling, will also be helpful.          Track and Manage Fluids and Swelling-Heart Failure   Not on track    Timeframe:  Long-Range Goal Priority:  Medium Start Date:       03/13/21                      Expected End Date:       03/13/22  Follow Up Date 04/07/21   Please resume checking your daily weight at home and writing them down on paper-- call cardiology provider office if you gain more than 2 pounds in one day or 5 pounds in one week Write weights down on paper so it is easier to keep up with: you reported today that you have not been checking your weights at home Wear your home oxygen as prescribed Use salt in moderation Watch for swelling in feet, ankles and legs every day- if you notice new or increased swelling, please contact your cardiology (heart) doctor right away I have again asked the pharmacy team from Dr. Ronnald Ramp' office to contact you by phone to review your medications with you- please listen for a call from the pharmacy team Please schedule an appointment with Dr. Ronnald Ramp since you have had multiple hospital visits since your last office visit Please read over the attached information about Palliative Care and Hospice Services: please discuss your thoughts with Dr. Ronnald Ramp about what kind of help you are willing to have moving forward   Why is this important?   It is important to check your weight daily and watch how much salt and liquids you have.  It will help you to manage your heart failure.             Palliative Care Palliative care helps to improve the quality of life for people coping with serious and life-threatening illnesses. It involves care of the body, mind, and spirit. Palliative care services are different for each person and are based on the person's needs and preferences. Services often occur in the hospital or in a long-term care setting. Palliative care requires a team of professionals and loved ones. Services include: Steps to control pain and other symptoms and to provide comfort. Family support. Spiritual support. Emotional and social support. Palliative care is a way to bring organized services to a person for comfort and peace of mind. It can be helpful to a person and his or her family andfriends during the course of an illness. What is the difference between palliative care and hospice? Palliative care and hospice have similar goals. They both aim to manage symptoms, provide comfort, improve quality of life, and maintain a person's dignity. The difference is that palliative care can be offered during any phase of a serious illness, from diagnosis to cure. Hospice care is usually offered at the end of life, often when a person is expected to live for 6 months orless. Who can receive palliative care services? Palliative care is offered to children and adults who are seriously ill. It is often offered when a person: Is not responding well to treatment. Needs pain management. Has side effects from treatment, such as chemotherapy, that are difficult to manage. Has symptoms from the effects of surgery. Has a diagnosis of an advanced disease, or a disease that shortens his or her life. A health care provider will usually recommend palliative care services when more support would be helpful. Family members and friends may also receivepalliative care services to help manage stress and other concerns. Who makes up the palliative care team? The following people make up a  palliative care team: The person receiving care and his or her family. Physicians, including primary health care providers and specialists. Nurses. A Education officer, museum, psychologist, or psychiatrist. Depending on one's needs, the following people may also be included on a palliative care team: A pain specialist. A hospice specialist. A financial or Research officer, trade union. Religious  or Surveyor, quantity. A care coordinator or case manager. A bereavement coordinator. The team will speak with the person and his or her family about: The person's physical symptoms, such as pain, nausea, vomiting, shortness of breath, and the need for a pain specialist and hospice specialist. Life and death as a normal process. Advance directives or living wills, health care proxies, and end-of-life care. Stress, depression, and anxiety symptoms. Treatment options and how to maintain as much function and mobility as possible. Spiritual wishes, such as rituals and prayer. Legacy and [employer] activities. The palliative care team helps a person talk about difficult issues and willaddress spiritual and emotional concerns. Where to find more information [employer] on Aging: http://kim-miller.com/ National Hospice and Palliative Care Organization: http://www.brown-buchanan.com/ Summary Palliative care helps to improve the quality of life for people coping with serious and life-threatening illnesses. It involves care of the body, mind, and spirit. The specific services are different for each person and are based on the person's needs and preferences. Palliative care is a way to bring comfort and peace of mind to a person and his or her family. This information is not intended to replace advice given to you by your health care provider. Make sure you discuss any questions you have with your healthcare provider. Document Revised: 05/07/2020 Document Reviewed: 05/07/2020 Elsevier Patient Education  2022 [place] is a service that provides people who are terminally ill and their families with medical, spiritual, and psychological support. It aims to improve a person's quality of life by keeping the person as comfortable as possible inthe final stages of life. Who makes up the hospice care team? The following people make up a hospice care team: The person receiving care and his or her family. A nurse and a hospice doctor. Your primary health care provider can also be included. A Education officer, museum or Tourist information centre manager. A religious or spiritual leader. Specialists such as: A dietitian. A mental health counselor. Physical and occupational therapists. Bereavement coordinator. Trained volunteers who can help with care. What services are included in hospice care? Hospice services can vary, depending on the organization. Generally, they include: Ways to keep you comfortable, such as: Providing care in your home or in a home-like setting. Providing relief from pain and symptoms. The staff will supply all medicines and equipment to keep you comfortable and alert enough to enjoy the company of friends and family. Working with your loved ones to help them meet your needs and provide much of your basic care. This helps you to enjoy their support. Visits or care from a nurse and doctor. This may include 24-hour on-call services. Visits from other specialists who offer services, such as: Counseling to meet your emotional, spiritual, and social needs as well as those needs of your family. Massage therapy. Nutrition therapy. Physical and occupational therapy. Art or music therapy. Spiritual care to meet your needs and your family's needs. It may involve: Helping you and your family understand the dying process. Helping you say goodbye to your family and friends. Performing a specific religious ceremony or ritual. Companionship when you are alone. Allowing you and your family to rest. Hospice  staff may do light housekeeping, prepare meals, and run errands. Short-term inpatient care, if something cannot be managed in the home. Bereavement support for grieving family members. When should hospice care begin? Most people who use hospice are believed to have 6 months or less to live. Your family and health care providers can help you  decide when hospice services should begin. If you live longer than 6 months but your condition does not improve, your doctor may be able to approve you for continued hospice care. If your condition improves, you may discontinue the program. What should I consider before selecting a program? Most hospice programs are run by nonprofit, independent organizations. Some are affiliated with hospitals, nursing homes, or home health care agencies. Hospice programs can take place in your home or at a hospice center, hospital, or skilled nursing facility. When choosing a hospice program, ask about the following: What services are available to me and my loved ones? What does it cost? Is it covered by insurance? Is the program reviewed and licensed by the state or certified in some other way? How will my pain and symptoms be managed? Will you provide emotional and spiritual support? If I choose a hospice center or nursing home, where is the hospice center located? Is it convenient for family and friends? If I choose a hospice center or nursing home, will my family and friends be able to visit any time? If my circumstances change, can the services be provided in a different setting, such as in my home or in the hospital? Who makes up the hospice care team? How are they trained or screened? How involved can my loved ones be? Whom can my family call with questions? How is my health care provider involved? Where can I learn more about hospice? You can learn about existing hospice programs in your area from your health care providers. The websites of the following  organizations have helpful information: [employer] and Palliative Care Organization: http://www.brown-buchanan.com/ National Association for [place]: http://massey-hart.com/ Hospice Foundation of America: www.hospicefoundation.org American Cancer Society: www.cancer.org eHospice: ehospice.com These agencies also can provide information: A local agency on aging. Your local [employer] chapter. Your state's department of health or social services. Summary Hospice is a service that provides people who are terminally ill and their families with medical, spiritual, and psychological support. Hospice aims to improve your quality of life by keeping you as comfortable as possible in the final stages of life. Hospice teams often include a doctor, nurse, social worker, counselor, religious or spiritual leader, dietitian, therapists, and volunteers. Hospice care generally includes pain management, visits from doctors and nurses, physical and occupational therapy, nutrition therapy, spiritual and emotional counseling, caregiver support, and bereavement support for grieving family members. Hospice programs can take place in your home or at a hospice center, hospital, or skilled nursing facility. This information is not intended to replace advice given to you by your health care provider. Make sure you discuss any questions you have with your healthcare provider. Document Revised: 05/07/2020 Document Reviewed: 05/07/2020 Elsevier Patient Education  [place].  The patient verbalized understanding of instructions, educational materials, and care plan provided today and agreed to receive a mailed copy of patient instructions, educational materials, and care plan.  Telephone follow up appointment with care management team member scheduled for:  Monday April 07, 2021 at 3:30 pm The patient has been provided with contact information for the care management team and has been advised to call with any health related  questions or concerns.   Oneta Rack, RN, BSN, [facility] RN Care Coordination- [place] [phone]: direct office [phone]: mobile

## 2021-04-02 NOTE — Chronic Care Management (AMB) (Signed)
Chronic Care Management   CCM RN Visit Note  04/02/2021 Name: Katherine [name] MRN: [id number] DOB: Nov 15, 1955  Subjective: Katherine [name] is a 65 y.o. year old female who is a primary care patient of [name], MD. The care management team was consulted for assistance with disease management and care coordination needs.    Engaged with patient by telephone for follow up visit in response to provider referral for case management and/or care coordination services.   Consent to Services:  The patient was given information about Chronic Care Management services, agreed to services, and gave verbal consent prior to initiation of services.  Please see initial visit note for detailed documentation.  Patient agreed to services and verbal consent obtained.   Assessment: Review of patient past medical history, allergies, medications, health status, including review of consultants reports, laboratory and other test data, was performed as part of comprehensive evaluation and provision of chronic care management services.   CCM Care Plan Allergies  Allergen Reactions   Tetracycline Hives    Patient tolerated Doxycycline Dec 2020   Niacin Other (See Comments)    Mouth blisters   Niaspan [Niacin Er] Other (See Comments)    Mouth blisters   Sulfa Antibiotics Nausea Only and Other (See Comments)    "Tears up stomach"   Sulfonamide Derivatives Other (See Comments)    Reaction: per patient "tears her stomach up"   Codeine Nausea And Vomiting   Erythromycin Nausea And Vomiting   Hydromorphone Hcl Nausea And Vomiting   Morphine And Related Nausea And Vomiting   Nalbuphine Nausea And Vomiting    Nubain   Sulfasalazine Nausea Only and Other (See Comments)    per patient "tears her stomach up", "Tears up stomach"   Tape Rash and Other (See Comments)    No "plastic" tape," please----cloth tape only   Outpatient Encounter Medications as of 04/02/2021  Medication Sig Note   acetaminophen  (TYLENOL) 500 MG tablet Take 1,500 mg by mouth 2 (two) times daily.    albuterol (VENTOLIN HFA) 108 (90 Base) MCG/ACT inhaler TAKE 2 PUFFS BY MOUTH EVERY 6 HOURS AS NEEDED FOR WHEEZE OR SHORTNESS OF BREATH (Patient taking differently: Inhale 2 puffs into the lungs every 6 (six) hours as needed for wheezing or shortness of breath.) 03/21/2021: LF 02/10/2021 (25DS) [facility]   amoxicillin (AMOXIL) 250 MG capsule Take 1 capsule (250 mg total) by mouth 3 (three) times daily. 03/22/2021: #21/7 days filled 03/12/2021 - pt states that she might have 4 capsules left to take   arformoterol (BROVANA) 15 MCG/2ML NEBU Take 2 mLs (15 mcg total) by nebulization 2 (two) times daily.    azaTHIOprine (IMURAN) 50 MG tablet Take 125 mg by mouth See admin instructions. Take 2 1/2 tablets (125 mg) by mouth daily at 3pm 03/21/2021: #75/30 days filled 03/15/[facility]21 [facility]   B-D ULTRAFINE III SHORT PEN 31G X 8 MM MISC See admin instructions.    Blood Glucose Monitoring Suppl (ONE TOUCH ULTRA 2) w/Device KIT 2 (two) times daily.    budesonide (PULMICORT) 0.5 MG/2ML nebulizer solution Take 2 mLs (0.5 mg total) by nebulization 2 (two) times daily.    calcitRIOL (ROCALTROL) 0.25 MCG capsule Take 0.25 mcg by mouth every 3 (three) days.  03/21/2021: #45/90 days filled [facility]/03/[facility]21 [facility]   cetirizine (ZYRTEC) 10 MG tablet Take 10 mg by mouth daily.    clopidogrel (PLAVIX) 75 MG tablet Take 1 tablet (  75 mg total) by mouth daily. 03/21/2021: #90 filled [facility]21/[facility]21 [facility]   clotrimazole (MYCELEX) 10 MG troche Take 1 tablet (10 mg total) by mouth 5 (five) times daily as needed (thrush). 03/21/2021: #40/10 days filed 02/18/2021 [facility]   cyclobenzaprine (FLEXERIL) 10 MG tablet TAKE 1 TO 2 TABS BY MOUTH DAILY. TAKE 2 TAB DAILY AT BEDTIME, MAY ALSO TAKE 1 TABLET BY MOUTH AT NOON AS NEEDED FOR MUSCLE SPASMS (Patient taking differently:  Take 20 mg by mouth daily at 12 noon.) 03/21/2021: #90/30 days filled 03/02/[facility]21 [facility]   diclofenac Sodium (VOLTAREN) 1 % GEL Apply 2 g topically 4 (four) times daily.    diltiazem (CARDIZEM) 30 MG tablet take 1 tablet every 4 hours AS NEEDED for afib heart rate >100 as long as top blood pressure >100. (Patient taking differently: Take 30 mg by mouth See admin instructions. take 1 tablet (30 mg) every 4 hours AS NEEDED for afib heart rate >100 as long as top blood pressure >100.) 03/21/2021: #30 filled [facility]04[facility]21 [facility]   dofetilide (TIKOSYN) 250 MCG capsule Take 1 capsule (250 mcg total) by mouth 2 (two) times daily. 03/21/2021: #180/90 days filled [facility]14/2021 [facility]   DULoxetine (CYMBALTA) 30 MG capsule TAKE 2 CAPSULES (60 MG TOTAL) BY MOUTH EVERY MORNING. (Patient taking differently: Take 60 mg by mouth every morning.) 03/21/2021: #180/90 days filled [facility]26[facility]21 [facility]   ELIQUIS 5 MG TABS tablet TAKE 1 TABLET BY MOUTH TWICE A DAY (TO REPLACE WARFARIN) (Patient taking differently: Take 5 mg by mouth 2 (two) times daily.) 03/21/2021: #60/30 days filled 03[facility]15/[facility]21 [facility]   ferrous sulfate 325 (65 FE) MG tablet Take 325 mg by mouth daily with breakfast.    fluticasone (FLONASE) 50 MCG/ACT nasal spray PLACE 2 SPRAYS INTO BOTH NOSTRILS 2 TIMES DAILY (Patient taking differently: Place 2 sprays into both nostrils 2 (two) times daily.) 03/21/2021: LF 03/19/2021 (90DS) [facility]   furosemide (LASIX) 40 MG tablet Take 1 tablet (40 mg total) by mouth daily. 03/21/2021: #90 filled [facility]13[facility]21 [facility]   glimepiride (AMARYL) 4 MG tablet Take 4 mg by mouth daily. 03/21/2021: #90 filled [facility]21 [facility]   insulin aspart (NOVOLOG FLEXPEN) 100 UNIT/ML FlexPen Inject 14 Units into the skin 3 (three) times daily after meals. 03/21/2021: LF  03/04/2021 (107 DS) [facility]   Insulin Glargine (BASAGLAR KWIKPEN) 100 UNIT/ML SOPN Inject 12 Units into the skin daily before breakfast. 03/21/2021: LF 02/25/2021 (100 DS) [facility]   isosorbide mononitrate (IMDUR) 60 MG 24 hr tablet Take 1 tablet (60 mg total) by mouth daily.    lamoTRIgine (LAMICTAL) 25 MG tablet TAKE 1 TABLET BY MOUTH EVERYDAY AT BEDTIME (Patient taking differently: Take 25 mg by mouth at bedtime.) 03/21/2021: #90 filled [facility]05/[facility]21 [facility]   Lancets (ONETOUCH DELICA PLUS [id number]) [place] daily. as directed    LORazepam (ATIVAN) 1 MG tablet Take 1 tablet (1 mg total) by mouth 2 (two) times daily. 03/21/2021: #60/30 days filled [facility]   metFORMIN (GLUCOPHAGE) 500 MG tablet Take 1 tablet (500 mg total) by mouth 2 (two) times daily. 03/21/2021: #180/90 days filled [facility]18[facility]21 [facility]   metoprolol tartrate (LOPRESSOR) 100 MG tablet Take 1 tablet (100 mg total) by mouth  2 (two) times daily. Appointment Required For Further Refills [phone]    montelukast (SINGULAIR) 10 MG tablet TAKE 1 TABLET BY MOUTH EVERYDAY AT BEDTIME (Patient taking differently: Take 10 mg by mouth at bedtime.) 03/21/2021: #90 filled [facility]10[facility]21 [facility]   nitrofurantoin, macrocrystal-monohydrate, (MACROBID) 100 MG capsule Take 1 capsule (100 mg total) by mouth 2 (two) times daily.    nitroGLYCERIN (NITROSTAT) 0.4 MG SL tablet Place 1 tablet (0.4 mg total) under the tongue every 5 (five) minutes as needed for chest pain. 03/21/2021: #25 filled [facility]03/[facility]21 [facility]   [facility] ULTRA test strip USE AS DIRECTED 3 TIMES DAILY TO CHECK BLOOD SUGAR    Oxycodone HCl 10 MG TABS Take 1 tablet (10 mg total) by mouth 4 (four) times daily as needed.    OXYGEN Inhale 3 L into the lungs continuous.    pantoprazole (PROTONIX) 40 MG tablet Take 1 tablet (40 mg total) by mouth daily.  03/21/2021: #30 filled [facility]21 [facility]   predniSONE (DELTASONE) 5 MG tablet Take 5 mg by mouth every morning. 03/21/2021: Continuous - #90 filled 03[facility]09/[facility]21 [facility]   promethazine (PHENERGAN) 25 MG tablet Take 1 tablet (25 mg total) by mouth at bedtime as needed for nausea. For nausea and sleep 03/21/2021: #120/30 days filled [facility]17[facility]21 [facility]   rosuvastatin (CRESTOR) 40 MG tablet Take 1 tablet (40 mg total) by mouth daily. 03/21/2021: #30 filled 02/03/2021 [facility] per external pharmacy records   sertraline (ZOLOFT) 100 MG tablet Take 100 mg by mouth every morning. 03/21/2021: #180/90 days filled [facility]21 [facility]   No facility-administered encounter medications on file as of 04/02/2021.   Patient Active Problem List   Diagnosis Date Noted   Viral syndrome 03/21/2021   Pure hypercholesterolemia    Coronary stent restenosis due to scar tissue    Unstable angina ([place]) 02/19/2021   Acute on chronic diastolic CHF (congestive heart failure) ([place]) 01/29/2021   Acute CHF (congestive heart failure) ([place]) 01/29/2021   Acute on chronic systolic CHF (congestive heart failure) ([place]) 01/29/2021   Non-ST elevation (NSTEMI) myocardial infarction [facility])    Chronic respiratory failure with hypoxia ([place]) 01/01/2021   Flu vaccine need 08/13/2020   Need for pneumococcal vaccination 08/13/2020   Visit for screening mammogram 08/13/2020   Candidal skin infection 08/13/2020   GAD (generalized anxiety disorder) 08/13/2020   Chronic bronchitis, mucopurulent ([place]) 08/24/2019   Current chronic use of systemic steroids 07/23/2019   OSA (obstructive sleep apnea)    Severe episode of recurrent major depressive disorder, with psychotic features ([place]) 02/16/2019   Obstructive chronic bronchitis without exacerbation ([place]) 11/03/2018   Long term (current) use of anticoagulants 04/28/2018   Renal transplant,  status post 01/28/2018   LBBB (left bundle branch block) 10/07/2017   Primary osteoarthritis of right knee 08/18/2017   Shortness of breath 06/18/2017   Mild aortic stenosis by prior echocardiogram 01/28/2017   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   Deficiency anemia 10/14/2016   Normocytic anemia 10/13/2016   Fibromyalgia 03/30/2016   Other spondylosis with radiculopathy, lumbar region 03/30/2016   Type 2 diabetes mellitus with peripheral neuropathy ([place]) 03/30/2016   Paroxysmal atrial fibrillation ([place]); CHA2DS2VASc score F, HTN, CAD, CVA = 5 06/28/2014   Hypertension associated with diabetes ([place])    Chest pain 08/23/2013   PAD (peripheral artery disease) ([place]) 08/17/2013   Stenosis of right carotid artery without infarction  10/08/2012   Dyslipidemia, goal LDL below 70 10/08/2012    Class: Diagnosis of   Mitral annular calcification 10/08/2012   Hypomagnesemia 06/13/2012   Renal transplant disorder 06/12/2012   Chronic allergic rhinitis 04/29/2011   Extrinsic asthma 09/09/2007   GERD 09/09/2007   COUGH, CHRONIC 09/09/2007   Morbid obesity - s/p Lap Band 9/'05 05/07/2004    Class: Diagnosis of   Hx of CABG 09/07/1993    Class: History of   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 07/1993   CAD in native artery 07/1993   ESRD (end stage renal disease) ([place]) 1991   Conditions to be addressed/monitored:  CHF and DMII  Care Plan : Heart Failure (Adult)  Updates made by Knox Royalty, RN since 04/02/2021 12:00 AM     Problem: Symptom Exacerbation (Heart Failure)   Priority: High     Long-Range Goal: Symptom Exacerbation Prevented or Minimized   Start Date: 03/13/2021  Expected End Date: 03/13/2022  This Visit's Progress: Not on track  Recent Progress: On track  Priority: High  Note:   Current Barriers:  Knowledge Deficits related to basic heart failure pathophysiology and self care/management Knowledge Deficits related to medications used for  management of multiple chronic conditions: polypharmacy with high risk medications noted- CCM Pharmacy referral placed 03/13/21; second CCM Pharmacy referral placed 04/02/21 UNABLE to independently verbalize blood sugars at home, insulin dosing; does not record on paper Multiple recent hospitalizations:  Chronic pain- attends pain management clinic Fragile state of health, multiple progressing chronic health conditions Memory loss- new patient neurological appointment scheduled 04/30/21- patient has difficulty focusing/ flight of thoughts/ ideas Case Manager Clinical Goal(s):  03/13/21: Over the next 12 months, patient will verbalize understanding of Heart Failure Action Plan and when to call doctor, as evidenced by patient/ caregiver reporting during CCM RN CM outreach of: Completion of daily weight monitoring/ recording (notifying MD of 3 lb weight gain over night or 5 lb in a week) 04/02/21: patient reports has not been monitoring/ recording weights at home post- recent hospital and ED discharges; reports in too much pain to weigh Adhering to low salt diet Taking prescribed medications as prescribed 04/02/21: patient reports ongoing significant confusion around her medications and cannot tell me what she is/ is not taking Attending all care team/ provider appointments Contacting care team for questions, concerns, or problems Interventions:  Collaboration with [name], MD regarding development and update of comprehensive plan of care as evidenced by provider attestation and co-signature Inter-disciplinary care team collaboration (see longitudinal plan of care) Chart reviewed including relevant office notes, upcoming scheduled appointments, recent hospital visits and ED visit post- our last appointment, and lab results Discussed current  clinical condition with patient and she reports, "having a terrible day," as noted above with clinical complaints of pain, fever, nausea; declines advice to go  to ED/ [facility] Confirms not monitoring daily weights at home: sounds to be in no distress at time of our call, she is able to talk without sounding SOB; denies SOB outside of baseline; reports she is taking "all of prescribed medications for heart;" but she is unable to tell me what she is/ is not specifically taking: second CCM pharmacy referral placed Cannot tell me whether home health services are in place or not-- states she "thinks they called and are coming to house this week;" unable to tell me which agency home health services are with- noted she has previously declined home health services post- other  hospital discharges: review of hospital discharge March 22, 2021 indicates patient did not have home health services initiated- reported "no needs"  When discussing with patient possibility that she needs assistance in her home to manage her self-care/ medications/ ADL's and iADL's she breaks down crying and states that she "is dying and suffering all the time;" we had a long and pointed conversation about the  possibility of Palliative Care and/ or Hospice services- however, at this time she states that although she "knows" she is dying, she doesn't want Hospice involved; discussed difference between Palliative Care/ Hospice services and encouraged her to talk to her friend coming this afternoon, who she reports is a "hospice nurse;" she states she will do and declines further conversation today but agrees to re-address at a later time Reiterated previously provided verbal education around signs/ symptoms yellow CHF zone/ weight gain guidelines in setting of CHF along with corresponding action plan:  patient will require ongoing reinforcement around same, given her ongoing stated inability to focus; and lack of action in consistently monitoring her daily weights, questionable medication adherence, etc   Reviewed scheduled/upcoming provider appointments including: 04/03/21- A-Fib clinic; 04/30/21- neurological  (new patient) provider visit; 06/04/21- pain management clinic Discussed plans with patient for ongoing care management follow up and provided patient with direct contact information for care management team Self-Care Activities: UNABLE to independently verbalize blood sugars at home, insulin dosing; does not record on paper Self administers oral medications as prescribed-- however- polypharmacy with high risk medications noted- CCM Pharmacy referral placed Self administers insulin as prescribed Attends all scheduled provider appointments- niece handles provider appointments and provides transportation Patient Goals: Please resume checking your daily weight at home and writing them down on paper-- call cardiology provider office if you gain more than 2 pounds in one day or 5 pounds in one week Write weights down on paper so it is easier to keep up with: you reported today that you have not been checking your weights at home Wear your home oxygen as prescribed Use salt in moderation Watch for swelling in feet, ankles and legs every day- if you notice new or increased swelling, please contact your cardiology (heart) doctor right away I have again asked the pharmacy team from Dr. Ronnald Ramp' office to contact you by phone to review your medications with you- please listen for a call from the pharmacy team Please schedule an appointment with Dr. Ronnald Ramp since you have had multiple hospital visits since your last office visit Please read over the attached information about Palliative Care and Hospice Services: please discuss your thoughts with Dr. Ronnald Ramp about what kind of help you are willing to have moving forward Follow Up Plan:  Telephone follow up appointment with care management team member scheduled for:  Monday April 07, 2021 at 3:30 pm The patient has been provided with contact information for the care management team and has been advised to call with any health related questions or concerns.       Care Plan : Diabetes Type 2 (Adult)  Updates made by Knox Royalty, RN since 04/02/2021 12:00 AM     Problem: Glycemic Management (Diabetes, Type 2)   Priority: Medium     Long-Range Goal: Glycemic Management Optimized   Start Date: 03/13/2021  Expected End Date: 03/13/2022  This Visit's Progress: Not on track  Recent Progress: On track  Priority: Medium  Note:   Objective:  Lab Results  Component Value Date   HGBA1C 9.1 (H) 01/31/2021  Lab Results  Component Value Date   CREATININE 0.81 02/21/2021   CREATININE 0.81 02/20/2021   CREATININE 0.97 02/19/2021   No results found for: EGFR Current Barriers:  Knowledge Deficits related to basic Diabetes pathophysiology and self care/management Knowledge Deficits related to medications used for management of diabetes: polypharmacy with high risk medications noted- CCM Pharmacy referral placed 03/13/21-- second CCM pharmacy referral placed today 04/02/21 UNABLE to independently verbalize blood sugars at home, insulin dosing; does not record on paper Multiple recent hospitalizations: since initial outreach 03/13/21, patient has had another hospitalization and another ED visit:  4 inpatient hospitalizations over last 9 months Chronic pain- attends pain management clinic Fragile state of health, multiple progressing chronic health conditions Memory loss- new patient neurological appointment scheduled 04/30/21- patient has difficulty focusing/ flight of thoughts/ ideas Increased A1-C from 7.7 (07/11/21) to 9.1 (01/31/21) Case Manager Clinical Goal(s):  03/13/21: Over the next 12 months, patient will demonstrate improved adherence to prescribed treatment plan for diabetes self care/management as evidenced by patient reporting during CCM RN CM outreach of:  Daily monitoring and recording of CBG 2-3 daily 04/02/21: patient reports not monitoring/ recording blood sugars at home post- most recent hospital discharge, and then subsequent ED visit  03/30/21 Adherence to ADA/ carb modified, low sugar diet 04/02/21: patient reports "not eating because I feel so bad and am in so much pain but I am still taking insulin even tough I am not eating" Adherence to prescribed medication regimen 04/02/21: patient reports she is unable to find her pain medication and she is unable to tell me whether she is taking macrobid, prescribed 03/30/21 post- ED discharge, states she "has no idea" if medication was picked up by her niece post- ED visit Contacting care providers for new or worsened symptoms, concerns or questions Interventions:  Collaboration with [name], MD regarding development and update of comprehensive plan of care as evidenced by provider attestation and co-signature Inter-disciplinary care team collaboration (see longitudinal plan of care) Review of patient status, including review of consultants reports, relevant laboratory and other test results, new/ recent hospitalization August 5-6, 2022; ED visit March 30, 2021 and medications completed Discussed current clinical condition with patient: "I feel horrible, I can't find my pain medicine and I am running a fever; I can't eat anything.  I am not checking my blood sugars but I am still taking my insulin" Reviewed with patient recent ED visit- as noted above, she is unable to tell me whether she is taking prescribed macrobid post- ED visit, states she is not even sure whether or not her niece picked this medication up after they left the ED-- I offered to contact her niece to follow up, patient adamantly declines my offer stating that she "does not want" me to call niece; noted niece "[name]" not on [place].  Patient reports niece "may have taken my pain medicine because she thinks I take too much pain medicine;" upon questioning this statement, patient back-tracks and says she "doesn't really think" niece took her medicine, she also clarifies that "if she did take it it isn't for her personal  use-- it's just because she thinks I take too much of it and she worries about me" Patient states her friend Jackelyn Poling (also a Merchandiser, retail) is coming over this afternoon to help her locate her pain medication, and patient will have Jackelyn Poling try to locate macrobid as well Care Coordination Outreach to [place]: received confirmation from pharmacist Ebony Hail that [place]  was picked up post-ED visit 03/30/21: shared this information with patient Advised patient that she may need to seek emergent care in ED, based on multiple clinical complaints/ medication issues she describes today: she adamantly declines this option, states "friend Jackelyn Poling is on the way over to help;" advised her to ask Jackelyn Poling to check her blood sugar, assist in locating medications that patient is unable to  Again encouraged patient to schedule an appointment with Dr. Ronnald Ramp; she states she will do Second referral made to pharmacy team for assistance with polypharmacy; patient confusion around medications in general; multiple high risk medications Reviewed scheduled/upcoming provider appointments including: 04/03/21- A-Fib clinic; 04/30/21- neurological (new patient) provider visit; 06/04/21- pain management clinic-- when asked if patient plans to attend appointments, she states "I have no idea;" states that she doesn't know whether her niece is aware of the appointments or not-- she states she will call her niece to follow up on the medication issue and inquire about the appointments-- she declines transportation resources: states that she prefers her family to take her Discussed plans with patient for ongoing care management follow up and provided patient with direct contact information for care management team Self-Care Activities UNABLE to independently verbalize blood sugars at home, insulin dosing; does not record on paper Self administers oral medications as prescribed-- however- polypharmacy with high risk medications  noted- CCM Pharmacy referral placed Self administers insulin as prescribed Attends all scheduled provider appointments- niece handles provider appointments and provides transportation Patient Goals: Please schedule an appointment with Dr Ronnald Ramp- he has not had a visit with you since December and you have had multiple hospital visits since your last visit with Dr. Ronnald Ramp Please resume checking and writing down blood sugars at home 2-3 times per day; check blood sugar more often if I feel it is too high or too low or if I feel sick Take all medications as prescribed: I have again asked the Pharmacy team at Dr. Ronnald Ramp' office to contact you to review your medications: please listen for a call from the pharmacy  Please write your blood sugars from home down on paper so we can review these during our phone call visits Follow Up Plan:  Telephone follow up appointment with care management team member scheduled for: Monday April 07, 2021 at 3:30 pm The patient has been provided with contact information for the care management team and has been advised to call with any health related questions or concerns.      Plan: Telephone follow up appointment with care management team member scheduled for:  Monday April 07, 2021 at 3:30 pm The patient has been provided with contact information for the care management team and has been advised to call with any health related questions or concerns.   Oneta Rack, RN, BSN, [facility] RN Care Coordination- [place] [phone]: direct office [phone]: mobile

## 2021-04-03 ENCOUNTER — Encounter (HOSPITAL_COMMUNITY): Payer: Self-pay

## 2021-04-03 ENCOUNTER — Ambulatory Visit (HOSPITAL_COMMUNITY): Payer: Medicare Other | Admitting: Nurse Practitioner

## 2021-04-03 ENCOUNTER — Telehealth (HOSPITAL_COMMUNITY): Payer: Self-pay

## 2021-04-03 LAB — URINE CULTURE: Culture: 30000 — AB

## 2021-04-03 NOTE — Telephone Encounter (Signed)
Patient called and states she was very sorry she was not able to make it to her appointment today at the [facility]. Her phone has been lost all day. She finally was able to find it. Patient was very tearful and emotional during the call. She lives alone and she states she is not eating hardly at all. Advised her to contact her brother to see if he could come by and visit. I will talk with Kennyth Lose in [facility] and see if she could give her some support and see exactly what she needs to care for herself. Consulted with patient and told her we will check in with her in one month to see how she is doing and look into scheduling her follow up appointment sometime in September. Patient verbalized understanding.

## 2021-04-04 ENCOUNTER — Ambulatory Visit: Payer: Medicare Other | Admitting: *Deleted

## 2021-04-04 ENCOUNTER — Telehealth (HOSPITAL_COMMUNITY): Payer: Self-pay | Admitting: Licensed Clinical Social Worker

## 2021-04-04 ENCOUNTER — Telehealth (HOSPITAL_COMMUNITY): Payer: Self-pay

## 2021-04-04 DIAGNOSIS — I509 Heart failure, unspecified: Secondary | ICD-10-CM

## 2021-04-04 DIAGNOSIS — E1142 Type 2 diabetes mellitus with diabetic polyneuropathy: Secondary | ICD-10-CM

## 2021-04-04 NOTE — Telephone Encounter (Signed)
Patient was given a 30 day supply of her Tikosyn medication. Advised patient she needs to come in for her appointment in August if she wanted to continue getting further refills on her Tikosyn medication. Patient has not been seen in the [facility] in over a year. Tikosyn is a high risk drug and requires close monitoring- EKGs and blood work while on this medication. Patient has cancelled her appointments multiple times and due to her failure to come in to be seen we will no longer give her refills on her Tikosyn medication. Kennyth Lose Education officer, museum from [facility] reached out to Chronic Care Management regarding this patient. She refuses to want help from the resources she has been offered such as free transportation to get to her appointments and won't allow anyone to come in and assess her needs.

## 2021-04-04 NOTE — Chronic Care Management (AMB) (Signed)
Chronic Care Management   CCM RN Visit Note  04/04/2021 Name: Katherine [name] MRN: [id number] DOB: 1955/11/13  Subjective: Katherine [name] is a 65 y.o. year old female who is a primary care patient of [name], MD. The care management team was consulted for assistance with disease management and care coordination needs.    Collaboration with [facility] CSW Katherine [name]  for  care coordination/ collaboration  in response to provider referral for case management and/or care coordination services.   Consent to Services:  The patient was given information about Chronic Care Management services, agreed to services, and gave verbal consent prior to initiation of services.  Please see initial visit note for detailed documentation.  Patient agreed to services and verbal consent obtained.   Assessment: Review of patient past medical history, allergies, medications, health status, including review of consultants reports, laboratory and other test data, was performed as part of comprehensive evaluation and provision of chronic care management services.   CCM Care Plan Allergies  Allergen Reactions   Tetracycline Hives    Patient tolerated Doxycycline Dec 2020   Niacin Other (See Comments)    Mouth blisters   Niaspan [Niacin Er] Other (See Comments)    Mouth blisters   Sulfa Antibiotics Nausea Only and Other (See Comments)    "Tears up stomach"   Sulfonamide Derivatives Other (See Comments)    Reaction: per patient "tears her stomach up"   Codeine Nausea And Vomiting   Erythromycin Nausea And Vomiting   Hydromorphone Hcl Nausea And Vomiting   Morphine And Related Nausea And Vomiting   Nalbuphine Nausea And Vomiting    Nubain   Sulfasalazine Nausea Only and Other (See Comments)    per patient "tears her stomach up", "Tears up stomach"   Tape Rash and Other (See Comments)    No "plastic" tape," please----cloth tape only   Outpatient Encounter Medications as of  04/04/2021  Medication Sig Note   acetaminophen (TYLENOL) 500 MG tablet Take 1,500 mg by mouth 2 (two) times daily.    albuterol (VENTOLIN HFA) 108 (90 Base) MCG/ACT inhaler TAKE 2 PUFFS BY MOUTH EVERY 6 HOURS AS NEEDED FOR WHEEZE OR SHORTNESS OF BREATH (Patient taking differently: Inhale 2 puffs into the lungs every 6 (six) hours as needed for wheezing or shortness of breath.) 03/21/2021: LF 02/10/2021 (25DS) [facility]   amoxicillin (AMOXIL) 250 MG capsule Take 1 capsule (250 mg total) by mouth 3 (three) times daily. 03/22/2021: #21/7 days filled 03/12/2021 - pt states that she might have 4 capsules left to take   arformoterol (BROVANA) 15 MCG/2ML NEBU Take 2 mLs (15 mcg total) by nebulization 2 (two) times daily.    azaTHIOprine (IMURAN) 50 MG tablet Take 125 mg by mouth See admin instructions. Take 2 1/2 tablets (125 mg) by mouth daily at 3pm 03/21/2021: #75/30 days filled 03/15/[facility]21 [facility]   B-D ULTRAFINE III SHORT PEN 31G X 8 MM MISC See admin instructions.    Blood Glucose Monitoring Suppl (ONE TOUCH ULTRA 2) w/Device KIT 2 (two) times daily.    budesonide (PULMICORT) 0.5 MG/2ML nebulizer solution Take 2 mLs (0.5 mg total) by nebulization 2 (two) times daily.    calcitRIOL (ROCALTROL) 0.25 MCG capsule Take 0.25 mcg by mouth every 3 (three) days.  03/21/2021: #45/90 days filled [facility]/03/[facility]21 [facility]   cetirizine (ZYRTEC) 10 MG tablet Take 10 mg by mouth daily.    clopidogrel (PLAVIX)  75 MG tablet Take 1 tablet (75 mg total) by mouth daily. 03/21/2021: #90 filled [facility]21/[facility]21 [facility]   clotrimazole (MYCELEX) 10 MG troche Take 1 tablet (10 mg total) by mouth 5 (five) times daily as needed (thrush). 03/21/2021: #40/10 days filed 02/18/2021 [facility]   cyclobenzaprine (FLEXERIL) 10 MG tablet TAKE 1 TO 2 TABS BY MOUTH DAILY. TAKE 2 TAB DAILY AT BEDTIME, MAY ALSO TAKE 1 TABLET BY MOUTH AT NOON AS NEEDED  FOR MUSCLE SPASMS (Patient taking differently: Take 20 mg by mouth daily at 12 noon.) 03/21/2021: #90/30 days filled 03/02/[facility]21 [facility]   diclofenac Sodium (VOLTAREN) 1 % GEL Apply 2 g topically 4 (four) times daily.    diltiazem (CARDIZEM) 30 MG tablet take 1 tablet every 4 hours AS NEEDED for afib heart rate >100 as long as top blood pressure >100. (Patient taking differently: Take 30 mg by mouth See admin instructions. take 1 tablet (30 mg) every 4 hours AS NEEDED for afib heart rate >100 as long as top blood pressure >100.) 03/21/2021: #30 filled [facility]04[facility]21 [facility]   dofetilide (TIKOSYN) 250 MCG capsule Take 1 capsule (250 mcg total) by mouth 2 (two) times daily. 03/21/2021: #180/90 days filled [facility]14/2021 [facility]   DULoxetine (CYMBALTA) 30 MG capsule TAKE 2 CAPSULES (60 MG TOTAL) BY MOUTH EVERY MORNING. (Patient taking differently: Take 60 mg by mouth every morning.) 03/21/2021: #180/90 days filled [facility]26/[facility]21 [facility]   ELIQUIS 5 MG TABS tablet TAKE 1 TABLET BY MOUTH TWICE A DAY (TO REPLACE WARFARIN) (Patient taking differently: Take 5 mg by mouth 2 (two) times daily.) 03/21/2021: #60/30 days filled [facility]15/[facility]21 [facility]   ferrous sulfate 325 (65 FE) MG tablet Take 325 mg by mouth daily with breakfast.    fluticasone (FLONASE) 50 MCG/ACT nasal spray PLACE 2 SPRAYS INTO BOTH NOSTRILS 2 TIMES DAILY (Patient taking differently: Place 2 sprays into both nostrils 2 (two) times daily.) 03/21/2021: LF 03/19/2021 (90DS) [facility]   furosemide (LASIX) 40 MG tablet Take 1 tablet (40 mg total) by mouth daily. 03/21/2021: #90 filled [facility]13[facility]21 [facility]   glimepiride (AMARYL) 4 MG tablet Take 4 mg by mouth daily. 03/21/2021: #90 filled [facility]21 [facility]   insulin aspart (NOVOLOG FLEXPEN) 100 UNIT/ML FlexPen Inject 14 Units into the skin 3  (three) times daily after meals. 03/21/2021: LF 03/04/2021 (107 DS) [facility]   Insulin Glargine (BASAGLAR KWIKPEN) 100 UNIT/ML SOPN Inject 12 Units into the skin daily before breakfast. 03/21/2021: LF 02/25/2021 (100 DS) [facility]   isosorbide mononitrate (IMDUR) 60 MG 24 hr tablet Take 1 tablet (60 mg total) by mouth daily.    lamoTRIgine (LAMICTAL) 25 MG tablet TAKE 1 TABLET BY MOUTH EVERYDAY AT BEDTIME (Patient taking differently: Take 25 mg by mouth at bedtime.) 03/21/2021: #90 filled [facility]05/[facility]21 [facility]   Lancets (ONETOUCH DELICA PLUS [id number]) [place] daily. as directed    LORazepam (ATIVAN) 1 MG tablet Take 1 tablet (1 mg total) by mouth 2 (two) times daily. 03/21/2021: #60/30 days filled [facility]   metFORMIN (GLUCOPHAGE) 500 MG tablet Take 1 tablet (500 mg total) by mouth 2 (two) times daily. 03/21/2021: #180/90 days filled [facility]18/[facility]21 [facility]   metoprolol tartrate (LOPRESSOR) 100 MG tablet Take 1  tablet (100 mg total) by mouth 2 (two) times daily. Appointment Required For Further Refills [phone]    montelukast (SINGULAIR) 10 MG tablet TAKE 1 TABLET BY MOUTH EVERYDAY AT BEDTIME (Patient taking differently: Take 10 mg by mouth at bedtime.) 03/21/2021: #90 filled [facility]10[facility]21 [facility]   nitrofurantoin, macrocrystal-monohydrate, (MACROBID) 100 MG capsule Take 1 capsule (100 mg total) by mouth 2 (two) times daily.    nitroGLYCERIN (NITROSTAT) 0.4 MG SL tablet Place 1 tablet (0.4 mg total) under the tongue every 5 (five) minutes as needed for chest pain. 03/21/2021: #25 filled [facility]21 [facility]   [facility] ULTRA test strip USE AS DIRECTED 3 TIMES DAILY TO CHECK BLOOD SUGAR    Oxycodone HCl 10 MG TABS Take 1 tablet (10 mg total) by mouth 4 (four) times daily as needed.    OXYGEN Inhale 3 L into the lungs continuous.    pantoprazole (PROTONIX) 40 MG tablet  Take 1 tablet (40 mg total) by mouth daily. 03/21/2021: #30 filled [facility]21 [facility]   predniSONE (DELTASONE) 10 MG tablet TAKE 4 TABLETS (40 MG TOTAL) BY MOUTH DAILY WITH BREAKFAST FOR 3 DAYS, THEN 3 TABLETS (30 MG TOTAL) DAILY WITH BREAKFAST FOR 3 DAYS, THEN 2 TABLETS (20 MG TOTAL) DAILY WITH BREAKFAST FOR 3 DAYS, THEN 1 TABLET (10 MG TOTAL) DAILY WITH BREAKFAST FOR 3 DAYS. THEN GO BACK TO 5 MG DAILY.[name] Kitchen    predniSONE (DELTASONE) 5 MG tablet Take 5 mg by mouth every morning. 03/21/2021: Continuous - #90 filled [facility]09[facility]21 [facility]   promethazine (PHENERGAN) 25 MG tablet Take 1 tablet (25 mg total) by mouth at bedtime as needed for nausea. For nausea and sleep 03/21/2021: #120/30 days filled [facility]21 [facility]   rosuvastatin (CRESTOR) 40 MG tablet Take 1 tablet (40 mg total) by mouth daily. 03/21/2021: #30 filled 02/03/2021 [facility] per external pharmacy records   sertraline (ZOLOFT) 100 MG tablet Take 100 mg by mouth every morning. 03/21/2021: #180/90 days filled [facility]09[facility]21 [facility]   No facility-administered encounter medications on file as of 04/04/2021.   Patient Active Problem List   Diagnosis Date Noted   Viral syndrome 03/21/2021   Pure hypercholesterolemia    Coronary stent restenosis due to scar tissue    Unstable angina ([place]) 02/19/2021   Acute on chronic diastolic CHF (congestive heart failure) ([place]) 01/29/2021   Acute CHF (congestive heart failure) ([place]) 01/29/2021   Acute on chronic systolic CHF (congestive heart failure) ([place]) 01/29/2021   Non-ST elevation (NSTEMI) myocardial infarction [facility])    Chronic respiratory failure with hypoxia ([place]) 01/01/2021   Flu vaccine need 08/13/2020   Need for pneumococcal vaccination 08/13/2020   Visit for screening mammogram 08/13/2020   Candidal skin infection 08/13/2020   GAD (generalized anxiety disorder) 08/13/2020   Chronic bronchitis,  mucopurulent ([place]) 08/24/2019   Current chronic use of systemic steroids 07/23/2019   OSA (obstructive sleep apnea)    Severe episode of recurrent major depressive disorder, with psychotic features ([place]) 02/16/2019   Obstructive chronic bronchitis without exacerbation ([place]) 11/03/2018   Long term (current) use of anticoagulants 04/28/2018   Renal transplant, status post 01/28/2018   LBBB (left bundle branch block) 10/07/2017   Primary osteoarthritis of right knee 08/18/2017   Shortness of breath 06/18/2017   Mild aortic stenosis by prior echocardiogram 01/28/2017   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   Deficiency anemia 10/14/2016   Normocytic  anemia 10/13/2016   Fibromyalgia 03/30/2016   Other spondylosis with radiculopathy, lumbar region 03/30/2016   Type 2 diabetes mellitus with peripheral neuropathy ([place]) 03/30/2016   Paroxysmal atrial fibrillation ([place]); CHA2DS2VASc score F, HTN, CAD, CVA = 5 06/28/2014   Hypertension associated with diabetes ([place])    Chest pain 08/23/2013   PAD (peripheral artery disease) ([place]) 08/17/2013   Stenosis of right carotid artery without infarction 10/08/2012   Dyslipidemia, goal LDL below 70 10/08/2012    Class: Diagnosis of   Mitral annular calcification 10/08/2012   Hypomagnesemia 06/13/2012   Renal transplant disorder 06/12/2012   Chronic allergic rhinitis 04/29/2011   Extrinsic asthma 09/09/2007   GERD 09/09/2007   COUGH, CHRONIC 09/09/2007   Morbid obesity - s/p Lap Band 9/'05 05/07/2004    Class: Diagnosis of   Hx of CABG 09/07/1993    Class: History of   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 07/1993   CAD in native artery 07/1993   ESRD (end stage renal disease) ([place]) 1991   Conditions to be addressed/monitored:  CHF and DMII  Care Plan : Heart Failure (Adult)  Updates made by Knox Royalty, RN since 04/04/2021 12:00 AM     Problem: Symptom Exacerbation (Heart Failure)   Priority: High     Long-Range  Goal: Symptom Exacerbation Prevented or Minimized   Start Date: 03/13/2021  Expected End Date: 03/13/2022  Recent Progress: Not on track  Priority: High  Note:   Current Barriers:  Knowledge Deficits related to basic heart failure pathophysiology and self care/management Knowledge Deficits related to medications used for management of multiple chronic conditions: polypharmacy with high risk medications noted- CCM Pharmacy referral placed 03/13/21; second CCM Pharmacy referral placed 04/02/21 UNABLE to independently verbalize blood sugars at home, insulin dosing; does not record on paper Multiple recent hospitalizations:  Chronic pain- attends pain management clinic Fragile state of health, multiple progressing chronic health conditions Memory loss- new patient neurological appointment scheduled 04/30/21- patient has difficulty focusing/ flight of thoughts/ ideas Case Manager Clinical Goal(s):  03/13/21: Over the next 12 months, patient will verbalize understanding of Heart Failure Action Plan and when to call doctor, as evidenced by patient/ caregiver reporting during CCM RN CM outreach of: Completion of daily weight monitoring/ recording (notifying MD of 3 lb weight gain over night or 5 lb in a week) 04/02/21: patient reports has not been monitoring/ recording weights at home post- recent hospital and ED discharges; reports in too much pain to weigh Adhering to low salt diet Taking prescribed medications as prescribed 04/02/21: patient reports ongoing significant confusion around her medications and cannot tell me what she is/ is not taking Attending all care team/ provider appointments Contacting care team for questions, concerns, or problems Interventions:  Collaboration with [name], MD regarding development and update of comprehensive plan of care as evidenced by provider attestation and co-signature Inter-disciplinary care team collaboration (see longitudinal plan of care) Chart  reviewed including relevant office notes, upcoming scheduled appointments, recent hospital visits and ED visit post- our last appointment, and lab results 04/04/21: Care Coordination outreach completed with [facility] CSW Katherine [name]: Katherine [name] contacted me to discuss patient case- she received request from [facility] team to follow up on concerns they had around patient's call to cancel her 04/03/21 scheduled office visit; discussed patient's case/ recent multiple hospitalizations with Katherine [name]; discussed patient's general non-adherence to established plan of care, refusal to engage with offered services; refusal to schedule/ attend provider  appointments.  Confirmed with Katherine [name] that I have patient scheduled for follow up call 04/07/21 to re-assess her desire to engage with offered help; including plan to offer CSW which patient has previously/ adamantly declined in the past.  Will keep Katherine [name] updated as needed.  From 04/02/21 patient outreach: Discussed current  clinical condition with patient and she reports, "having a terrible day," as noted above with clinical complaints of pain, fever, nausea; declines advice to go to ED/ [facility] Confirms not monitoring daily weights at home: sounds to be in no distress at time of our call, she is able to talk without sounding SOB; denies SOB outside of baseline; reports she is taking "all of prescribed medications for heart;" but she is unable to tell me what she is/ is not specifically taking: second CCM pharmacy referral placed Cannot tell me whether home health services are in place or not-- states she "thinks they called and are coming to house this week;" unable to tell me which agency home health services are with- noted she has previously declined home health services post- other hospital discharges: review of hospital discharge March 22, 2021 indicates patient did not have home health services initiated- reported "no needs"  When discussing with patient possibility  that she needs assistance in her home to manage her self-care/ medications/ ADL's and iADL's she breaks down crying and states that she "is dying and suffering all the time;" we had a long and pointed conversation about the  possibility of Palliative Care and/ or Hospice services- however, at this time she states that although she "knows" she is dying, she doesn't want Hospice involved; discussed difference between Palliative Care/ Hospice services and encouraged her to talk to her friend coming this afternoon, who she reports is a "hospice nurse;" she states she will do and declines further conversation today but agrees to re-address at a later time Reiterated previously provided verbal education around signs/ symptoms yellow CHF zone/ weight gain guidelines in setting of CHF along with corresponding action plan:  patient will require ongoing reinforcement around same, given her ongoing stated inability to focus; and lack of action in consistently monitoring her daily weights, questionable medication adherence, etc   Reviewed scheduled/upcoming provider appointments including: 04/03/21- A-Fib clinic; 04/30/21- neurological (new patient) provider visit; 06/04/21- pain management clinic Discussed plans with patient for ongoing care management follow up and provided patient with direct contact information for care management team Self-Care Activities: UNABLE to independently verbalize blood sugars at home, insulin dosing; does not record on paper Self administers oral medications as prescribed-- however- polypharmacy with high risk medications noted- CCM Pharmacy referral placed Self administers insulin as prescribed Attends all scheduled provider appointments- niece handles provider appointments and provides transportation Patient Goals: Please resume checking your daily weight at home and writing them down on paper-- call cardiology provider office if you gain more than 2 pounds in one day or 5 pounds in one  week Write weights down on paper so it is easier to keep up with: you reported today that you have not been checking your weights at home Wear your home oxygen as prescribed Use salt in moderation Watch for swelling in feet, ankles and legs every day- if you notice new or increased swelling, please contact your cardiology (heart) doctor right away I have again asked the pharmacy team from Dr. Ronnald Ramp' office to contact you by phone to review your medications with you- please listen for a call from the pharmacy team Please schedule an appointment  with Dr. Ronnald Ramp since you have had multiple hospital visits since your last office visit Please read over the attached information about Palliative Care and Hospice Services: please discuss your thoughts with Dr. Ronnald Ramp about what kind of help you are willing to have moving forward Follow Up Plan:  Telephone follow up appointment with care management team member scheduled for:  Monday April 07, 2021 at 3:30 pm The patient has been provided with contact information for the care management team and has been advised to call with any health related questions or concerns.      Care Plan : Diabetes Type 2 (Adult)  Updates made by Knox Royalty, RN since 04/04/2021 12:00 AM     Problem: Glycemic Management (Diabetes, Type 2)   Priority: Medium     Long-Range Goal: Glycemic Management Optimized   Start Date: 03/13/2021  Expected End Date: 03/13/2022  Recent Progress: Not on track  Priority: Medium  Note:   Objective:  Lab Results  Component Value Date   HGBA1C 9.1 (H) 01/31/2021   Lab Results  Component Value Date   CREATININE 0.81 02/21/2021   CREATININE 0.81 02/20/2021   CREATININE 0.97 02/19/2021   No results found for: EGFR Current Barriers:  Knowledge Deficits related to basic Diabetes pathophysiology and self care/management Knowledge Deficits related to medications used for management of diabetes: polypharmacy with high risk medications  noted- CCM Pharmacy referral placed 03/13/21-- second CCM pharmacy referral placed today 04/02/21 UNABLE to independently verbalize blood sugars at home, insulin dosing; does not record on paper Multiple recent hospitalizations: since initial outreach 03/13/21, patient has had another hospitalization and another ED visit:  4 inpatient hospitalizations over last 9 months Chronic pain- attends pain management clinic Fragile state of health, multiple progressing chronic health conditions Memory loss- new patient neurological appointment scheduled 04/30/21- patient has difficulty focusing/ flight of thoughts/ ideas Increased A1-C from 7.7 (07/11/21) to 9.1 (01/31/21) Case Manager Clinical Goal(s):  03/13/21: Over the next 12 months, patient will demonstrate improved adherence to prescribed treatment plan for diabetes self care/management as evidenced by patient reporting during CCM RN CM outreach of:  Daily monitoring and recording of CBG 2-3 daily 04/02/21: patient reports not monitoring/ recording blood sugars at home post- most recent hospital discharge, and then subsequent ED visit 03/30/21 Adherence to ADA/ carb modified, low sugar diet 04/02/21: patient reports "not eating because I feel so bad and am in so much pain but I am still taking insulin even tough I am not eating" Adherence to prescribed medication regimen 04/02/21: patient reports she is unable to find her pain medication and she is unable to tell me whether she is taking macrobid, prescribed 03/30/21 post- ED discharge, states she "has no idea" if medication was picked up by her niece post- ED visit Contacting care providers for new or worsened symptoms, concerns or questions Interventions:  Collaboration with [name], MD regarding development and update of comprehensive plan of care as evidenced by provider attestation and co-signature Inter-disciplinary care team collaboration (see longitudinal plan of care) Review of patient status,  including review of consultants reports, relevant laboratory and other test results, new/ recent hospitalization August 5-6, 2022; ED visit March 30, 2021 and medications completed Discussed current clinical condition with patient: "I feel horrible, I can't find my pain medicine and I am running a fever; I can't eat anything.  I am not checking my blood sugars but I am still taking my insulin" Reviewed with patient recent ED visit- as  noted above, she is unable to tell me whether she is taking prescribed macrobid post- ED visit, states she is not even sure whether or not her niece picked this medication up after they left the ED-- I offered to contact her niece to follow up, patient adamantly declines my offer stating that she "does not want" me to call niece; noted niece "Katherine [name]" not on [place].  Patient reports niece "may have taken my pain medicine because she thinks I take too much pain medicine;" upon questioning this statement, patient back-tracks and says she "doesn't really think" niece took her medicine, she also clarifies that "if she did take it it isn't for her personal use-- it's just because she thinks I take too much of it and she worries about me" Patient states her friend Katherine [name] (also a Merchandiser, retail) is coming over this afternoon to help her locate her pain medication, and patient will have Katherine [name] try to locate macrobid as well Care Coordination Outreach to [place]: received confirmation from pharmacist Ebony Hail that [place] was picked up post-ED visit 03/30/21: shared this information with patient Advised patient that she may need to seek emergent care in ED, based on multiple clinical complaints/ medication issues she describes today: she adamantly declines this option, states "friend Katherine [name] is on the way over to help;" advised her to ask Katherine [name] to check her blood sugar, assist in locating medications that patient is unable to  Again encouraged patient to schedule an  appointment with Dr. Ronnald Ramp; she states she will do Second referral made to pharmacy team for assistance with polypharmacy; patient confusion around medications in general; multiple high risk medications Reviewed scheduled/upcoming provider appointments including: 04/03/21- A-Fib clinic; 04/30/21- neurological (new patient) provider visit; 06/04/21- pain management clinic-- when asked if patient plans to attend appointments, she states "I have no idea;" states that she doesn't know whether her niece is aware of the appointments or not-- she states she will call her niece to follow up on the medication issue and inquire about the appointments-- she declines transportation resources: states that she prefers her family to take her Discussed plans with patient for ongoing care management follow up and provided patient with direct contact information for care management team Self-Care Activities UNABLE to independently verbalize blood sugars at home, insulin dosing; does not record on paper Self administers oral medications as prescribed-- however- polypharmacy with high risk medications noted- CCM Pharmacy referral placed Self administers insulin as prescribed Attends all scheduled provider appointments- niece handles provider appointments and provides transportation Patient Goals: Please schedule an appointment with Dr Ronnald Ramp- he has not had a visit with you since December and you have had multiple hospital visits since your last visit with Dr. Ronnald Ramp Please resume checking and writing down blood sugars at home 2-3 times per day; check blood sugar more often if I feel it is too high or too low or if I feel sick Take all medications as prescribed: I have again asked the Pharmacy team at Dr. Ronnald Ramp' office to contact you to review your medications: please listen for a call from the pharmacy  Please write your blood sugars from home down on paper so we can review these during our phone call visits Follow Up Plan:   Telephone follow up appointment with care management team member scheduled for: Monday April 07, 2021 at 3:30 pm The patient has been provided with contact information for the care management team and has been advised to call with any health  related questions or concerns.      Plan: Telephone follow up appointment with care management team member scheduled for:  Monday, April 07, 2021 at 3:30 pm The patient has been provided with contact information for the care management team and has been advised to call with any health related questions or concerns  Oneta Rack, RN, BSN, [place] [phone]: direct office [phone]: mobile

## 2021-04-04 NOTE — Progress Notes (Signed)
ED Antimicrobial Stewardship Positive Culture Follow Up   Shiloh Southern is an 65 y.o. female who presented to [facility] on 03/30/2021 with a chief complaint of No chief complaint on file.   Recent Results (from the past 720 hour(s))  Resp Panel by RT-PCR (Flu A&B, Covid) Nasopharyngeal Swab     Status: None   Collection Time: 03/21/21  3:07 PM   Specimen: Nasopharyngeal Swab; Nasopharyngeal(NP) swabs in vial transport medium  Result Value Ref Range Status   SARS Coronavirus 2 by RT PCR NEGATIVE NEGATIVE Final    Comment: (NOTE) SARS-CoV-2 target nucleic acids are NOT DETECTED.  The SARS-CoV-2 RNA is generally detectable in upper respiratory specimens during the acute phase of infection. The lowest concentration of SARS-CoV-2 viral copies this assay can detect is 138 copies/mL. A negative result does not preclude SARS-Cov-2 infection and should not be used as the sole basis for treatment or other patient management decisions. A negative result may occur with  improper specimen collection/handling, submission of specimen other than nasopharyngeal swab, presence of viral mutation(s) within the areas targeted by this assay, and inadequate number of viral copies(<138 copies/mL). A negative result must be combined with clinical observations, patient history, and epidemiological information. The expected result is Negative.  Fact Sheet for Patients:  EntrepreneurPulse.com.au  Fact Sheet for Healthcare Providers:  IncredibleEmployment.be  This test is no t yet approved or cleared by the [place] FDA and  has been authorized for detection and/or diagnosis of SARS-CoV-2 by FDA under an Emergency Use Authorization (EUA). This EUA will remain  in effect (meaning this test can be used) for the duration of the COVID-19 declaration under Section 564(b)(1) of the Act, 21 U.S.C.section 360bbb-3(b)(1), unless the authorization is terminated  or  revoked sooner.       Influenza A by PCR NEGATIVE NEGATIVE Final   Influenza B by PCR NEGATIVE NEGATIVE Final    Comment: (NOTE) The Xpert Xpress SARS-CoV-2/FLU/RSV plus assay is intended as an aid in the diagnosis of influenza from Nasopharyngeal swab specimens and should not be used as a sole basis for treatment. Nasal washings and aspirates are unacceptable for Xpert Xpress SARS-CoV-2/FLU/RSV testing.  Fact Sheet for Patients: EntrepreneurPulse.com.au  Fact Sheet for Healthcare Providers: IncredibleEmployment.be  This test is not yet approved or cleared by the [place] FDA and has been authorized for detection and/or diagnosis of SARS-CoV-2 by FDA under an Emergency Use Authorization (EUA). This EUA will remain in effect (meaning this test can be used) for the duration of the COVID-19 declaration under Section 564(b)(1) of the Act, 21 U.S.C. section 360bbb-3(b)(1), unless the authorization is terminated or revoked.  Performed at [facility], [place] [address] [place], [place] [address]   Resp Panel by RT-PCR (Flu A&B, Covid) Nasopharyngeal Swab     Status: None   Collection Time: 03/30/21  7:03 PM   Specimen: Nasopharyngeal Swab; Nasopharyngeal(NP) swabs in vial transport medium  Result Value Ref Range Status   SARS Coronavirus 2 by RT PCR NEGATIVE NEGATIVE Final    Comment: (NOTE) SARS-CoV-2 target nucleic acids are NOT DETECTED.  The SARS-CoV-2 RNA is generally detectable in upper respiratory specimens during the acute phase of infection. The lowest concentration of SARS-CoV-2 viral copies this assay can detect is 138 copies/mL. A negative result does not preclude SARS-Cov-2 infection and should not be used as the sole basis for treatment or other patient management decisions. A negative result may occur with  improper specimen collection/handling, submission of  specimen other than nasopharyngeal swab, presence of viral  mutation(s) within the areas targeted by this assay, and inadequate number of viral copies(<138 copies/mL). A negative result must be combined with clinical observations, patient history, and epidemiological information. The expected result is Negative.  Fact Sheet for Patients:  EntrepreneurPulse.com.au  Fact Sheet for Healthcare Providers:  IncredibleEmployment.be  This test is no t yet approved or cleared by the [place] FDA and  has been authorized for detection and/or diagnosis of SARS-CoV-2 by FDA under an Emergency Use Authorization (EUA). This EUA will remain  in effect (meaning this test can be used) for the duration of the COVID-19 declaration under Section 564(b)(1) of the Act, 21 U.S.C.section 360bbb-3(b)(1), unless the authorization is terminated  or revoked sooner.       Influenza A by PCR NEGATIVE NEGATIVE Final   Influenza B by PCR NEGATIVE NEGATIVE Final    Comment: (NOTE) The Xpert Xpress SARS-CoV-2/FLU/RSV plus assay is intended as an aid in the diagnosis of influenza from Nasopharyngeal swab specimens and should not be used as a sole basis for treatment. Nasal washings and aspirates are unacceptable for Xpert Xpress SARS-CoV-2/FLU/RSV testing.  Fact Sheet for Patients: EntrepreneurPulse.com.au  Fact Sheet for Healthcare Providers: IncredibleEmployment.be  This test is not yet approved or cleared by the [place] FDA and has been authorized for detection and/or diagnosis of SARS-CoV-2 by FDA under an Emergency Use Authorization (EUA). This EUA will remain in effect (meaning this test can be used) for the duration of the COVID-19 declaration under Section 564(b)(1) of the Act, 21 U.S.C. section 360bbb-3(b)(1), unless the authorization is terminated or revoked.  Performed at [facility], [place] [address] [place], [place] [address]   Urine Culture     Status: Abnormal    Collection Time: 03/30/21 10:48 PM   Specimen: Urine, Clean Catch  Result Value Ref Range Status   Specimen Description URINE, CLEAN CATCH  Final   Special Requests NONE  Final   Culture (A)  Final    30,000 COLONIES/mL MORGANELLA MORGANII 30,000 COLONIES/mL METHICILLIN RESISTANT STAPHYLOCOCCUS AUREUS    Report Status 04/03/2021 FINAL  Final   Organism ID, Bacteria MORGANELLA MORGANII (A)  Final   Organism ID, Bacteria METHICILLIN RESISTANT STAPHYLOCOCCUS AUREUS (A)  Final      Susceptibility   Morganella morganii - MIC*    AMPICILLIN >=32 RESISTANT Resistant     CEFAZOLIN >=64 RESISTANT Resistant     CIPROFLOXACIN <=0.25 SENSITIVE Sensitive     GENTAMICIN 4 SENSITIVE Sensitive     IMIPENEM 4 SENSITIVE Sensitive     NITROFURANTOIN 128 RESISTANT Resistant     TRIMETH/SULFA <=20 SENSITIVE Sensitive     AMPICILLIN/SULBACTAM 16 INTERMEDIATE Intermediate     PIP/TAZO <=4 SENSITIVE Sensitive     * 30,000 COLONIES/mL MORGANELLA MORGANII   Methicillin resistant staphylococcus aureus - MIC*    CIPROFLOXACIN >=8 RESISTANT Resistant     GENTAMICIN <=0.5 SENSITIVE Sensitive     NITROFURANTOIN 32 SENSITIVE Sensitive     OXACILLIN >=4 RESISTANT Resistant     TETRACYCLINE <=1 SENSITIVE Sensitive     VANCOMYCIN <=0.5 SENSITIVE Sensitive     TRIMETH/SULFA 20 SENSITIVE Sensitive     CLINDAMYCIN >=8 RESISTANT Resistant     RIFAMPIN <=0.5 SENSITIVE Sensitive     Inducible Clindamycin NEGATIVE Sensitive     * 30,000 COLONIES/mL METHICILLIN RESISTANT STAPHYLOCOCCUS AUREUS    [x]  Treated with Macrobid, organism resistant to prescribed antimicrobial.   Plan: call patient to see if  any urinary symptoms are still present. If yes:  New antibiotic prescription: Ciprofloxacin 500 mg PO BID x7days.  Continue Macrobid.  ED Provider: Lajean Saver, MD   Donald Pore 04/04/2021, 9:25 AM Clinical Pharmacist Monday - Friday phone -  [phone] Saturday - Sunday phone - [phone]

## 2021-04-04 NOTE — Telephone Encounter (Addendum)
CSW received referral from the [facility] clinic as patient was tearful and emotional during call to clinic. CSW reviewed the chart and aware that Chronic Care Management is involved and addressing some concerns. CSW left message for Reginia Naas, RN the [facility] for return call to collaborate on case and determine if CSW needs to assist with care needs. Raquel Sarna, [place], [place]  Addendum: CSW received return call from Reginia Naas, RN. She reports she has been involved with patient for sometime and spoke at length about the case needs at this time. Richarda Osmond reports she can access her social worker through [facility] if needed. CSW available if needed but will not get involved with patient at this time due to current access to social work services through [facility]. Raquel Sarna, [place], [place]

## 2021-04-05 ENCOUNTER — Telehealth: Payer: Self-pay | Admitting: Emergency Medicine

## 2021-04-05 NOTE — Telephone Encounter (Signed)
Post ED Visit - Positive Culture Follow-up: Unsuccessful Patient Follow-up  Culture assessed and recommendations reviewed by:  []  Elenor Quinones, Pharm.D. []  Heide Guile, Pharm.D., BCPS AQ-ID []  Parks Neptune, Pharm.D., BCPS []  Alycia Rossetti, Pharm.D., BCPS []  [place], Pharm.D., BCPS, AAHIVP []  Legrand Como, Pharm.D., BCPS, AAHIVP [x]  Alfonzo Feller, PharmD []  Vincenza Hews, PharmD, BCPS  Positive urine culture  []  Patient discharged without antimicrobial prescription and treatment is now indicated []  Organism is resistant to prescribed ED discharge antimicrobial []  Patient with positive blood cultures   Unable to contact patient at phone number on file, letter will be sent to address on file  Plan: Call patient to see if urinary symptoms are still present (dysuria, frequency, suprapubic pain) if yes, Cipro 500 mg PO BID for seven days. Lajean Saver, MD  Milus Mallick 04/05/2021, 2:57 PM

## 2021-04-07 ENCOUNTER — Ambulatory Visit: Payer: Medicare Other | Admitting: *Deleted

## 2021-04-07 DIAGNOSIS — I509 Heart failure, unspecified: Secondary | ICD-10-CM

## 2021-04-07 DIAGNOSIS — E1142 Type 2 diabetes mellitus with diabetic polyneuropathy: Secondary | ICD-10-CM

## 2021-04-07 NOTE — Chronic Care Management (AMB) (Signed)
Chronic Care Management   CCM RN Visit Note  04/07/2021 Name: Katherine [name] MRN: [id number] DOB: 1955-10-06  Subjective: Katherine [name] is a 65 y.o. year old female who is a primary care patient of [name], MD. The care management team was consulted for assistance with disease management and care coordination needs.    Engaged with patient by telephone for follow up visit in response to provider referral for case management and/or care coordination services.   Consent to Services:  The patient was given information about Chronic Care Management services, agreed to services, and gave verbal consent prior to initiation of services.  Please see initial visit note for detailed documentation.  Patient agreed to services and verbal consent obtained.   Assessment: Review of patient past medical history, allergies, medications, health status, including review of consultants reports, laboratory and other test data, was performed as part of comprehensive evaluation and provision of chronic care management services.   CCM Care Plan Allergies  Allergen Reactions   Tetracycline Hives    Patient tolerated Doxycycline Dec 2020   Niacin Other (See Comments)    Mouth blisters   Niaspan [Niacin Er] Other (See Comments)    Mouth blisters   Sulfa Antibiotics Nausea Only and Other (See Comments)    "Tears up stomach"   Sulfonamide Derivatives Other (See Comments)    Reaction: per patient "tears her stomach up"   Codeine Nausea And Vomiting   Erythromycin Nausea And Vomiting   Hydromorphone Hcl Nausea And Vomiting   Morphine And Related Nausea And Vomiting   Nalbuphine Nausea And Vomiting    Nubain   Sulfasalazine Nausea Only and Other (See Comments)    per patient "tears her stomach up", "Tears up stomach"   Tape Rash and Other (See Comments)    No "plastic" tape," please----cloth tape only   Outpatient Encounter Medications as of 04/07/2021  Medication Sig Note   acetaminophen  (TYLENOL) 500 MG tablet Take 1,500 mg by mouth 2 (two) times daily.    albuterol (VENTOLIN HFA) 108 (90 Base) MCG/ACT inhaler TAKE 2 PUFFS BY MOUTH EVERY 6 HOURS AS NEEDED FOR WHEEZE OR SHORTNESS OF BREATH (Patient taking differently: Inhale 2 puffs into the lungs every 6 (six) hours as needed for wheezing or shortness of breath.) 03/21/2021: LF 02/10/2021 (25DS) [facility]   amoxicillin (AMOXIL) 250 MG capsule Take 1 capsule (250 mg total) by mouth 3 (three) times daily. 03/22/2021: #21/7 days filled 03/12/2021 - pt states that she might have 4 capsules left to take   arformoterol (BROVANA) 15 MCG/2ML NEBU Take 2 mLs (15 mcg total) by nebulization 2 (two) times daily.    azaTHIOprine (IMURAN) 50 MG tablet Take 125 mg by mouth See admin instructions. Take 2 1/2 tablets (125 mg) by mouth daily at 3pm 03/21/2021: #75/30 days filled 03/15/[facility]21 [facility]   B-D ULTRAFINE III SHORT PEN 31G X 8 MM MISC See admin instructions.    Blood Glucose Monitoring Suppl (ONE TOUCH ULTRA 2) w/Device KIT 2 (two) times daily.    budesonide (PULMICORT) 0.5 MG/2ML nebulizer solution Take 2 mLs (0.5 mg total) by nebulization 2 (two) times daily.    calcitRIOL (ROCALTROL) 0.25 MCG capsule Take 0.25 mcg by mouth every 3 (three) days.  03/21/2021: #45/90 days filled 02/03/2021 [facility]   cetirizine (ZYRTEC) 10 MG tablet Take 10 mg by mouth daily.    clopidogrel (PLAVIX) 75 MG tablet Take 1 tablet (  75 mg total) by mouth daily. 03/21/2021: #90 filled [facility]21/[facility]21 [facility]   clotrimazole (MYCELEX) 10 MG troche Take 1 tablet (10 mg total) by mouth 5 (five) times daily as needed (thrush). 03/21/2021: #40/10 days filed 02/18/2021 [facility]   cyclobenzaprine (FLEXERIL) 10 MG tablet TAKE 1 TO 2 TABS BY MOUTH DAILY. TAKE 2 TAB DAILY AT BEDTIME, MAY ALSO TAKE 1 TABLET BY MOUTH AT NOON AS NEEDED FOR MUSCLE SPASMS (Patient taking differently:  Take 20 mg by mouth daily at 12 noon.) 03/21/2021: #90/30 days filled 03/02/2021 [facility]   diclofenac Sodium (VOLTAREN) 1 % GEL Apply 2 g topically 4 (four) times daily.    diltiazem (CARDIZEM) 30 MG tablet take 1 tablet every 4 hours AS NEEDED for afib heart rate >100 as long as top blood pressure >100. (Patient taking differently: Take 30 mg by mouth See admin instructions. take 1 tablet (30 mg) every 4 hours AS NEEDED for afib heart rate >100 as long as top blood pressure >100.) 03/21/2021: #30 filled [facility]04[facility]21 [facility]   dofetilide (TIKOSYN) 250 MCG capsule Take 1 capsule (250 mcg total) by mouth 2 (two) times daily. 03/21/2021: #180/90 days filled [facility]14/2021 [facility]   DULoxetine (CYMBALTA) 30 MG capsule TAKE 2 CAPSULES (60 MG TOTAL) BY MOUTH EVERY MORNING. (Patient taking differently: Take 60 mg by mouth every morning.) 03/21/2021: #180/90 days filled [facility]26/[facility]21 [facility]   ELIQUIS 5 MG TABS tablet TAKE 1 TABLET BY MOUTH TWICE A DAY (TO REPLACE WARFARIN) (Patient taking differently: Take 5 mg by mouth 2 (two) times daily.) 03/21/2021: #60/30 days filled 03/15/2021 [facility]   ferrous sulfate 325 (65 FE) MG tablet Take 325 mg by mouth daily with breakfast.    fluticasone (FLONASE) 50 MCG/ACT nasal spray PLACE 2 SPRAYS INTO BOTH NOSTRILS 2 TIMES DAILY (Patient taking differently: Place 2 sprays into both nostrils 2 (two) times daily.) 03/21/2021: LF 03/19/2021 (90DS) [facility]   furosemide (LASIX) 40 MG tablet Take 1 tablet (40 mg total) by mouth daily. 03/21/2021: #90 filled [facility]13/2021 [facility]   glimepiride (AMARYL) 4 MG tablet Take 4 mg by mouth daily. 03/21/2021: #90 filled [facility]21 [facility]   insulin aspart (NOVOLOG FLEXPEN) 100 UNIT/ML FlexPen Inject 14 Units into the skin 3 (three) times daily after meals. 03/21/2021: LF  03/04/2021 (107 DS) [facility]   Insulin Glargine (BASAGLAR KWIKPEN) 100 UNIT/ML SOPN Inject 12 Units into the skin daily before breakfast. 03/21/2021: LF 02/25/2021 (100 DS) [facility]   isosorbide mononitrate (IMDUR) 60 MG 24 hr tablet Take 1 tablet (60 mg total) by mouth daily.    lamoTRIgine (LAMICTAL) 25 MG tablet TAKE 1 TABLET BY MOUTH EVERYDAY AT BEDTIME (Patient taking differently: Take 25 mg by mouth at bedtime.) 03/21/2021: #90 filled [facility]05/[facility]21 [facility]   Lancets (ONETOUCH DELICA PLUS [id number]) [place] daily. as directed    LORazepam (ATIVAN) 1 MG tablet Take 1 tablet (1 mg total) by mouth 2 (two) times daily. 03/21/2021: #60/30 days filled [facility]   metFORMIN (GLUCOPHAGE) 500 MG tablet Take 1 tablet (500 mg total) by mouth 2 (two) times daily. 03/21/2021: #180/90 days filled [facility]18/[facility]21 [facility]   metoprolol tartrate (LOPRESSOR) 100 MG tablet Take 1 tablet (100 mg total) by mouth  2 (two) times daily. Appointment Required For Further Refills [phone]    montelukast (SINGULAIR) 10 MG tablet TAKE 1 TABLET BY MOUTH EVERYDAY AT BEDTIME (Patient taking differently: Take 10 mg by mouth at bedtime.) 03/21/2021: #90 filled [facility]10[facility]21 [facility]   nitrofurantoin, macrocrystal-monohydrate, (MACROBID) 100 MG capsule Take 1 capsule (100 mg total) by mouth 2 (two) times daily.    nitroGLYCERIN (NITROSTAT) 0.4 MG SL tablet Place 1 tablet (0.4 mg total) under the tongue every 5 (five) minutes as needed for chest pain. 03/21/2021: #25 filled [facility]03/[facility]21 [facility]   [facility] ULTRA test strip USE AS DIRECTED 3 TIMES DAILY TO CHECK BLOOD SUGAR    Oxycodone HCl 10 MG TABS Take 1 tablet (10 mg total) by mouth 4 (four) times daily as needed.    OXYGEN Inhale 3 L into the lungs continuous.    pantoprazole (PROTONIX) 40 MG tablet Take 1 tablet (40 mg total) by mouth daily.  03/21/2021: #30 filled [facility]21 [facility]   predniSONE (DELTASONE) 10 MG tablet TAKE 4 TABLETS (40 MG TOTAL) BY MOUTH DAILY WITH BREAKFAST FOR 3 DAYS, THEN 3 TABLETS (30 MG TOTAL) DAILY WITH BREAKFAST FOR 3 DAYS, THEN 2 TABLETS (20 MG TOTAL) DAILY WITH BREAKFAST FOR 3 DAYS, THEN 1 TABLET (10 MG TOTAL) DAILY WITH BREAKFAST FOR 3 DAYS. THEN GO BACK TO 5 MG DAILY.[name] Kitchen    predniSONE (DELTASONE) 5 MG tablet Take 5 mg by mouth every morning. 03/21/2021: Continuous - #90 filled 03/09/[facility]21 [facility]   promethazine (PHENERGAN) 25 MG tablet Take 1 tablet (25 mg total) by mouth at bedtime as needed for nausea. For nausea and sleep 03/21/2021: #120/30 days filled [facility]17[facility]21 [facility]   rosuvastatin (CRESTOR) 40 MG tablet Take 1 tablet (40 mg total) by mouth daily. 03/21/2021: #30 filled 02/03/2021 [facility] per external pharmacy records   sertraline (ZOLOFT) 100 MG tablet Take 100 mg by mouth every morning. 03/21/2021: #180/90 days filled [facility]09[facility]21 [facility]   No facility-administered encounter medications on file as of 04/07/2021.   Patient Active Problem List   Diagnosis Date Noted   Viral syndrome 03/21/2021   Pure hypercholesterolemia    Coronary stent restenosis due to scar tissue    Unstable angina ([place]) 02/19/2021   Acute on chronic diastolic CHF (congestive heart failure) ([place]) 01/29/2021   Acute CHF (congestive heart failure) ([place]) 01/29/2021   Acute on chronic systolic CHF (congestive heart failure) ([place]) 01/29/2021   Non-ST elevation (NSTEMI) myocardial infarction [facility])    Chronic respiratory failure with hypoxia ([place]) 01/01/2021   Flu vaccine need 08/13/2020   Need for pneumococcal vaccination 08/13/2020   Visit for screening mammogram 08/13/2020   Candidal skin infection 08/13/2020   GAD (generalized anxiety disorder) 08/13/2020   Chronic bronchitis, mucopurulent ([place]) 08/24/2019   Current  chronic use of systemic steroids 07/23/2019   OSA (obstructive sleep apnea)    Severe episode of recurrent major depressive disorder, with psychotic features ([place]) 02/16/2019   Obstructive chronic bronchitis without exacerbation ([place]) 11/03/2018   Long term (current) use of anticoagulants 04/28/2018   Renal transplant, status post 01/28/2018   LBBB (left bundle branch block) 10/07/2017   Primary osteoarthritis of right knee 08/18/2017   Shortness of breath 06/18/2017   Mild aortic stenosis by prior echocardiogram 01/28/2017   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   Deficiency anemia 10/14/2016   Normocytic anemia 10/13/2016   Fibromyalgia 03/30/2016  Other spondylosis with radiculopathy, lumbar region 03/30/2016   Type 2 diabetes mellitus with peripheral neuropathy ([place]) 03/30/2016   Paroxysmal atrial fibrillation ([place]); CHA2DS2VASc score F, HTN, CAD, CVA = 5 06/28/2014   Hypertension associated with diabetes ([place])    Chest pain 08/23/2013   PAD (peripheral artery disease) ([place]) 08/17/2013   Stenosis of right carotid artery without infarction 10/08/2012   Dyslipidemia, goal LDL below 70 10/08/2012    Class: Diagnosis of   Mitral annular calcification 10/08/2012   Hypomagnesemia 06/13/2012   Renal transplant disorder 06/12/2012   Chronic allergic rhinitis 04/29/2011   Extrinsic asthma 09/09/2007   GERD 09/09/2007   COUGH, CHRONIC 09/09/2007   Morbid obesity - s/p Lap Band 9/'05 05/07/2004    Class: Diagnosis of   Hx of CABG 09/07/1993    Class: History of   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 07/1993   CAD in native artery 07/1993   ESRD (end stage renal disease) ([place]) 1991   Conditions to be addressed/monitored:  CHF and DMII  Care Plan : Heart Failure (Adult)  Updates made by Knox Royalty, RN since 04/07/2021 12:00 AM     Problem: Symptom Exacerbation (Heart Failure)   Priority: High     Long-Range Goal: Symptom Exacerbation Prevented or  Minimized   Start Date: 03/13/2021  Expected End Date: 03/13/2022  This Visit's Progress: Not on track  Recent Progress: Not on track  Priority: High  Note:   Current Barriers:  Knowledge Deficits related to basic heart failure pathophysiology and self care/management Knowledge Deficits related to medications used for management of multiple chronic conditions: polypharmacy with high risk medications noted- CCM Pharmacy referral placed 03/13/21; second CCM Pharmacy referral placed 04/02/21 UNABLE to independently verbalize blood sugars at home, insulin dosing; does not record on paper Multiple recent hospitalizations:  Chronic pain- attends pain management clinic Fragile state of health, multiple progressing chronic health conditions Memory loss- new patient neurological appointment scheduled 04/30/21- patient has difficulty focusing/ flight of thoughts/ ideas Case Manager Clinical Goal(s):  03/13/21: Over the next 12 months, patient will verbalize understanding of Heart Failure Action Plan and when to call doctor, as evidenced by patient/ caregiver reporting during CCM RN CM outreach of: Completion of daily weight monitoring/ recording (notifying MD of 3 lb weight gain over night or 5 lb in a week) 04/07/21: patient reports has not been monitoring/ recording weights at home post- recent hospital and ED discharges; reports "in too much pain," "Too sick" to weigh Adhering to low salt diet Taking prescribed medications as prescribed 04/02/21: patient reports ongoing significant confusion around her medications and cannot tell me what she is/ is not taking Attending all care team/ provider appointments Contacting care team for questions, concerns, or problems Interventions:  Collaboration with [name], MD regarding development and update of comprehensive plan of care as evidenced by provider attestation and co-signature Inter-disciplinary care team collaboration (see longitudinal plan of  care) Chart reviewed including relevant office notes, upcoming scheduled appointments, recent hospital visits and ED visit post- our last appointment, and lab results Discussed current  clinical condition with patient and she reports, "things are better today," states that she is feeling better than our last conversation last week; patient  unable to recall any of the details around our call and did not remember speaking with me at all until she was reminded of the specific details of our conversation Confirms still not monitoring/ recording weights at home;  reminded patient of  previously provided education around importance of daily weight monitoring at home, along with action plan for same Confirmed that patient denies SOB, excessive/ worsened swelling over baseline Confirmed that patient reports continues to wear home O2 "all the time" at 3 L/min After reminding patient about our conversation about hospice/ palliative care options, she declines ongoing conversation around same; states that she thinks her family may be trying to "put me in a nursing home;" patient adamantly declines this option, states her cat and her dog are too important to her to go anywhere but her own home Discussed need to re-schedule appointment with A-Fib clinic promptly- she stated "they are supposed to call me;" made her aware that she should call to schedule appointment and informed her that Tikosyn refills will be compromised if she does not schedule and attend follow up visit Reiterated previously provided verbal education around signs/ symptoms yellow CHF zone/ weight gain guidelines in setting of CHF along with corresponding action plan:  patient will require ongoing reinforcement around same, given her ongoing stated and demonstrated inability to focus; and lack of action in consistently monitoring her daily weights, questionable medication adherence, etc   Reviewed scheduled/upcoming provider appointments including:  04/30/21- neurological (new patient) provider visit; 06/04/21- pain management clinic Discussed plans with patient for ongoing care management follow up and provided patient with direct contact information for care management team Self-Care Activities: UNABLE to independently verbalize blood sugars at home, insulin dosing; does not record on paper Self administers oral medications as prescribed-- however- polypharmacy with high risk medications noted- CCM Pharmacy referral placed Self administers insulin as prescribed Attends all scheduled provider appointments- niece handles provider appointments and provides transportation Patient Goals: Please resume checking your daily weight at home and writing them down on paper-- call cardiology provider office if you gain more than 2 pounds in one day or 5 pounds in one week Write weights down on paper so it is easier to keep up with: you reported today that you have not been checking your weights at home Wear your home oxygen as prescribed Use salt in moderation Watch for swelling in feet, ankles and legs every day- if you notice new or increased swelling, please contact your cardiology (heart) doctor right away I have again asked the pharmacy team from Dr. Ronnald Ramp' office to contact you by phone to review your medications with you- please listen for a call from the pharmacy team Please schedule an appointment with Dr. Ronnald Ramp since you have had multiple hospital visits since your last office visit Please make an appointment with the [facility] to re-schedule your missed appointment from last week; this is important because they will not be able to re-fill your prescriptions if you do not make an appointment Follow Up Plan:  Telephone follow up appointment with care management team member scheduled for:  Tuesday June 24, 2021 at 3:30 pm The patient has been provided with contact information for the care management team and has been advised to call with any  health related questions or concerns.      Care Plan : Diabetes Type 2 (Adult)  Updates made by Knox Royalty, RN since 04/07/2021 12:00 AM     Problem: Glycemic Management (Diabetes, Type 2)   Priority: Medium     Long-Range Goal: Glycemic Management Optimized   Start Date: 03/13/2021  Expected End Date: 03/13/2022  This Visit's Progress: Not on track  Recent Progress: Not on track  Priority: Medium  Note:   Objective:  Lab Results  Component Value Date   HGBA1C 9.1 (H) 01/31/2021   Lab Results  Component Value Date   CREATININE 0.81 02/21/2021   CREATININE 0.81 02/20/2021   CREATININE 0.97 02/19/2021   No results found for: EGFR Current Barriers:  Knowledge Deficits related to basic Diabetes pathophysiology and self care/management Knowledge Deficits related to medications used for management of diabetes: polypharmacy with high risk medications noted- CCM Pharmacy referral placed 03/13/21-- second CCM pharmacy referral placed today 04/02/21 UNABLE to independently verbalize blood sugars at home, insulin dosing; does not record on paper Multiple recent hospitalizations: since initial outreach 03/13/21--  4 inpatient hospitalizations over last 9 months Chronic pain- attends pain management clinic Fragile state of health, multiple progressing chronic health conditions Memory loss- new patient neurological appointment scheduled 04/30/21- patient has difficulty focusing/ flight of thoughts/ ideas Increased A1-C from 7.7 (07/11/21) to 9.1 (01/31/21) Case Manager Clinical Goal(s):  03/13/21: Over the next 12 months, patient will demonstrate improved adherence to prescribed treatment plan for diabetes self care/management as evidenced by patient reporting during CCM RN CM outreach of:  Daily monitoring and recording of CBG 2-3 daily 04/07/21: patient again reports not monitoring/ recording blood sugars at home post- most recent hospital discharge, and then subsequent ED visit  03/30/21 Adherence to ADA/ carb modified, low sugar diet 04/07/21: patient again reports "not eating much;" reports conflicting feedback around use of insulin: at one point in conversation she reports has been taking, in another she states she has not been taking Adherence to prescribed medication regimen 04/07/21: patient reports today that she and her friend "found" her missing pain medications as reported to me 04/02/21-- patient has no recollection of our phone call on Wednesday 04/12/21; nor of our phone call to her outpatient pharmacy to follow up on ED prescription from 03/30/21; today she reports "taking" antibiotic  Contacting care providers for new or worsened symptoms, concerns or questions Interventions:  Collaboration with [name], MD regarding development and update of comprehensive plan of care as evidenced by provider attestation and co-signature Inter-disciplinary care team collaboration (see longitudinal plan of care) Review of patient status, including review of consultants reports, relevant laboratory and other test results, new/ recent hospitalization August 5-6, 2022; ED visit March 30, 2021 and medications completed Discussed current clinical condition with patient: today, she sounds better than she did 04/02/21 and reports "doing okay" Again reports has not been eating much; has not been checking blood sugars at home; inconsistent in reports of whether she is taking insulin/ medications Reminded patient of importance of monitoring blood sugars and eating properly when taking insulin; states "sometimes when you are sick you just don't feel like doing those things" Reiterated role of CCM team: patient declines needs; states she would like for someone to come in to her home and help her on a regular basis-- I confirmed that she has medicaid and we discussed personal care services and transportation benefit with medicaid: patient reports she has used transportation benefit before and  "hated it" because she had to ride on a "bumpy bus with a lot of other people;" stated it was too uncomfortable for her.  She declines need to speak with [employer] for assistance navigating medicaid PCS benefits- states she has her case workers phone number and will call her self to inquire Again encouraged patient to schedule an appointment with Dr. Ronnald Ramp; she states she will do Encouraged patient to listen for/ engage with pharmacy team for assistance with polypharmacy;  patient confusion around medications in general; multiple high risk medications Reviewed scheduled/upcoming provider appointments including:  04/30/21- neurological (new patient) provider visit; 06/04/21- pain management clinic Discussed plans with patient for ongoing care management follow up and provided patient with direct contact information for care management team Self-Care Activities UNABLE to independently verbalize blood sugars at home, insulin dosing; does not record on paper Self administers oral medications as prescribed-- however- polypharmacy with high risk medications noted- CCM Pharmacy referral placed Self administers insulin as prescribed Attends all scheduled provider appointments- niece handles provider appointments and provides transportation Patient Goals: Please schedule an appointment with Dr Ronnald Ramp- he has not had a visit with you since December 2021 and you have had multiple hospital visits since your last visit with Dr. Ronnald Ramp Please resume checking and writing down blood sugars at home 2-3 times per day; check blood sugar more often if I feel it is too high or too low or if I feel sick Take all medications as prescribed: I have again asked the Pharmacy team at Dr. Ronnald Ramp' office to contact you to review your medications: please listen for a call from the pharmacy team Please write your blood sugars from home down on paper so we can review these during our phone call visits Follow Up Plan:   Telephone follow up appointment with care management team member scheduled for: Tuesday June 24, 2021 at 3:30 pm The patient has been provided with contact information for the care management team and has been advised to call with any health related questions or concerns.       Plan: Telephone follow up appointment with care management team member scheduled for:  Tuesday, June 24, 2021 at 3:30 pm The patient has been provided with contact information for the care management team and has been advised to call with any health related questions or concerns  Oneta Rack, RN, BSN, [facility] [phone]: direct office [phone]: mobile

## 2021-04-07 NOTE — Patient Instructions (Signed)
Visit Katherine [name], it was nice talking with you today.   I look forward to talking to you again for an update on Tuesday, June 24, 2021 at 3:30 pm- please be listening out for my call that day.  I will call as close to 3:30 pm as possible.   If you need to cancel or re-schedule our telephone visit, please call [phone] and one of our care guides will be happy to assist you.   I look forward to hearing about your progress.   Please don't hesitate to contact me if I can be of assistance to you before our next scheduled telephone appointment.   Oneta Rack, RN, BSN, [facility] RN Care Coordination- [place] [phone]: direct office [phone]: mobile   PATIENT GOALS:  Goals Addressed             This Visit's Progress    Monitor and Manage My Blood Sugar-Diabetes Type 2   Not on track    Timeframe:  Long-Range Goal Priority:  Medium Start Date:           03/13/21                  Expected End Date:      03/13/22                 Follow Up Date 06/24/21    Please schedule an appointment with Dr Ronnald Ramp- he has not had a visit with you since December 2021 and you have had multiple hospital visits since your last visit with Dr. Ronnald Ramp Please resume checking and writing down blood sugars at home 2-3 times per day; check blood sugar more often if I feel it is too high or too low or if I feel sick Take all medications as prescribed: I have again asked the Pharmacy team at Dr. Ronnald Ramp' office to contact you to review your medications: please listen for a call from the pharmacy team Please write your blood sugars from home down on paper so we can review these during our phone call visits   Why is this important?   Checking your blood sugar at home helps to keep it from getting very high or very low.  Writing the results in a diary or log helps the doctor know how to care for you.  Your blood sugar log should have the time, date and the  results.  Also, write down the amount of insulin or other medicine that you take.  Other information, like what you ate, exercise done and how you were feeling, will also be helpful.          Track and Manage Fluids and Swelling-Heart Failure   Not on track    Timeframe:  Long-Range Goal Priority:  Medium Start Date:       03/13/21                      Expected End Date:       03/13/22                Follow Up Date 06/24/21   Please resume checking your daily weight at home and writing them down on paper-- call cardiology provider office if you gain more than 2 pounds in one day or 5 pounds in one week Write weights down on paper so it is easier to keep up with: you reported today that you have not been checking  your weights at home Wear your home oxygen as prescribed Use salt in moderation Watch for swelling in feet, ankles and legs every day- if you notice new or increased swelling, please contact your cardiology (heart) doctor right away I have again asked the pharmacy team from Dr. Ronnald Ramp' office to contact you by phone to review your medications with you- please listen for a call from the pharmacy team Please schedule an appointment with Dr. Ronnald Ramp since you have had multiple hospital visits since your last office visit Please make an appointment with the [facility] to re-schedule your missed appointment from last week; this is important because they will not be able to re-fill your prescriptions if you do not make an appointment   Why is this important?   It is important to check your weight daily and watch how much salt and liquids you have.  It will help you to manage your heart failure.            Patient verbalizes understanding of instructions provided today and agrees to view in [place].  Telephone follow up appointment with care management team member scheduled for:  Tuesday, June 24, 2021 at 3:30 pm The patient has been provided with contact information for the care  management team and has been advised to call with any health related questions or concerns  Oneta Rack, RN, BSN, [place] [phone]: direct office [phone]: mobile

## 2021-04-08 NOTE — Progress Notes (Addendum)
Called and spoke with patient, scheduled for next available appointment, reports that she is ok to wait until scheduled appointment 05/05/2021 - given office phone number to call with any issues or concerns in the meantime    Tomasa Blase, PharmD Clinical Pharmacist, [place]

## 2021-04-11 ENCOUNTER — Other Ambulatory Visit (HOSPITAL_COMMUNITY): Payer: Self-pay

## 2021-04-12 ENCOUNTER — Emergency Department (HOSPITAL_COMMUNITY): Payer: Medicare Other

## 2021-04-12 ENCOUNTER — Encounter (HOSPITAL_COMMUNITY): Payer: Self-pay

## 2021-04-12 ENCOUNTER — Emergency Department (HOSPITAL_COMMUNITY)
Admission: EM | Admit: 2021-04-12 | Discharge: 2021-04-12 | Disposition: A | Discharge status: Home / Self Care | Payer: Medicare Other | Attending: Emergency Medicine | Admitting: Emergency Medicine

## 2021-04-12 DIAGNOSIS — W19XXXA Unspecified fall, initial encounter: Secondary | ICD-10-CM

## 2021-04-12 DIAGNOSIS — M25512 Pain in left shoulder: Secondary | ICD-10-CM | POA: Insufficient documentation

## 2021-04-12 DIAGNOSIS — W01198A Fall on same level from slipping, tripping and stumbling with subsequent striking against other object, initial encounter: Secondary | ICD-10-CM | POA: Insufficient documentation

## 2021-04-12 DIAGNOSIS — S0990XA Unspecified injury of head, initial encounter: Secondary | ICD-10-CM | POA: Insufficient documentation

## 2021-04-12 DIAGNOSIS — M79651 Pain in right thigh: Secondary | ICD-10-CM | POA: Diagnosis not present

## 2021-04-12 DIAGNOSIS — R739 Hyperglycemia, unspecified: Secondary | ICD-10-CM | POA: Diagnosis not present

## 2021-04-12 DIAGNOSIS — R918 Other nonspecific abnormal finding of lung field: Secondary | ICD-10-CM | POA: Diagnosis not present

## 2021-04-12 DIAGNOSIS — S79921A Unspecified injury of right thigh, initial encounter: Secondary | ICD-10-CM | POA: Diagnosis not present

## 2021-04-12 DIAGNOSIS — S3993XA Unspecified injury of pelvis, initial encounter: Secondary | ICD-10-CM | POA: Diagnosis not present

## 2021-04-12 DIAGNOSIS — Z043 Encounter for examination and observation following other accident: Secondary | ICD-10-CM | POA: Diagnosis not present

## 2021-04-12 DIAGNOSIS — M25511 Pain in right shoulder: Secondary | ICD-10-CM | POA: Diagnosis not present

## 2021-04-12 DIAGNOSIS — R0902 Hypoxemia: Secondary | ICD-10-CM | POA: Diagnosis not present

## 2021-04-12 DIAGNOSIS — Z951 Presence of aortocoronary bypass graft: Secondary | ICD-10-CM | POA: Diagnosis not present

## 2021-04-12 DIAGNOSIS — I10 Essential (primary) hypertension: Secondary | ICD-10-CM | POA: Diagnosis not present

## 2021-04-12 LAB — COMPREHENSIVE METABOLIC PANEL
ALT: 9 U/L (ref 0–44)
AST: 15 U/L (ref 15–41)
Albumin: 2.8 g/dL — ABNORMAL LOW (ref 3.5–5.0)
Alkaline Phosphatase: 110 U/L (ref 38–126)
Anion gap: 11 (ref 5–15)
BUN: 9 mg/dL (ref 8–23)
CO2: 23 mmol/L (ref 22–32)
Calcium: 9 mg/dL (ref 8.9–10.3)
Chloride: 102 mmol/L (ref 98–111)
Creatinine, Ser: 0.92 mg/dL (ref 0.44–1.00)
GFR, Estimated: >60 mL/min (ref >60)
Glucose, Bld: 194 mg/dL — ABNORMAL HIGH (ref 70–99)
Potassium: 3.8 mmol/L (ref 3.5–5.1)
Sodium: 136 mmol/L (ref 135–145)
Total Bilirubin: 1 mg/dL (ref 0.3–1.2)
Total Protein: 6.7 g/dL (ref 6.5–8.1)

## 2021-04-12 LAB — ABO/RH: ABO/RH(D): O POS

## 2021-04-12 LAB — CBC WITH DIFFERENTIAL/PLATELET
Abs Immature Granulocytes: 0.03 10*3/uL (ref 0.00–0.07)
Basophils Absolute: 0.1 10*3/uL (ref 0.0–0.1)
Basophils Relative: 1 %
Eosinophils Absolute: 0.5 10*3/uL (ref 0.0–0.5)
Eosinophils Relative: 7 %
HCT: 36.3 % (ref 36.0–46.0)
Hemoglobin: 11.1 g/dL — ABNORMAL LOW (ref 12.0–15.0)
Immature Granulocytes: 0 %
Lymphocytes Relative: 17 %
Lymphs Abs: 1.1 10*3/uL (ref 0.7–4.0)
MCH: 30.3 pg (ref 26.0–34.0)
MCHC: 30.6 g/dL (ref 30.0–36.0)
MCV: 99.2 fL (ref 80.0–100.0)
Monocytes Absolute: 0.7 10*3/uL (ref 0.1–1.0)
Monocytes Relative: 11 %
Neutro Abs: 4.3 10*3/uL (ref 1.7–7.7)
Neutrophils Relative %: 64 %
Platelets: 339 10*3/uL (ref 150–400)
RBC: 3.66 MIL/uL — ABNORMAL LOW (ref 3.87–5.11)
RDW: 15.4 % (ref 11.5–15.5)
WBC: 6.7 10*3/uL (ref 4.0–10.5)
nRBC: 0 % (ref 0.0–0.2)

## 2021-04-12 LAB — TYPE AND SCREEN
ABO/RH(D): O POS
Antibody Screen: NEGATIVE

## 2021-04-12 NOTE — ED Notes (Signed)
IV team at the bedside. 

## 2021-04-12 NOTE — ED Notes (Signed)
Blue top tube sent to lab due to patient being on anticoagulants.. no order at this time.

## 2021-04-12 NOTE — ED Notes (Signed)
Pt comes from home via [facility], slipped in a puddle of coke in floor, is on Eliquis and plavix, hit head, no LOC, L shoulder pain and leg pain. Pt reports that she has fallen 4 times this week.

## 2021-04-12 NOTE — Progress Notes (Signed)
Orthopedic Tech Progress Note Patient Details:  Katherine [name] 20-Jun-1956 [id number] Level 2 trauma Patient ID: [name], female   DOB: [birth year], 65 y.o.   MRN: [id number]  Katherine [name] 04/12/2021, 2:06 AM

## 2021-04-12 NOTE — ED Provider Notes (Signed)
[facility] EMERGENCY DEPARTMENT Provider Note   CSN: [id number] Arrival date & time: 04/12/21  0106     History Chief Complaint  Patient presents with   Katherine [name]    Katherine [name] is a 65 y.o. female.  65 year old female with a history of falls the presents emerged department today with another fall.  Patient states that her feet slipped out from underneath her and she fell backwards.  She has allover pain secondary to her fibromyalgia but specifically today she has left shoulder pain and right thigh pain.  She is on Eliquis and also hit her head and thus was made a level 2 trauma prior to arrival. No syncope. No severe headache, chest pain, back pain, abdominal pain, palpitations or other associate symptoms associated with a fall.   Fall      History reviewed. No pertinent past medical history.  There are no problems to display for this patient.   History reviewed. No pertinent surgical history.   OB History   No obstetric history on file.     No family history on file.     Home Medications Prior to Admission medications   Not on File    Allergies    Patient has no known allergies.  Review of Systems   Review of Systems  All other systems reviewed and are negative.  Physical Exam Updated Vital Signs BP (!) 154/70 (BP Location: Right Arm)   Pulse 60   Temp 98.3 F (36.8 C) (Oral)   Resp 19   Ht 5\' 1"  (1.549 m)   Wt 90.7 kg   SpO2 100%   BMI 37.79 kg/m   Physical Exam Vitals and nursing note reviewed.  Constitutional:      Appearance: She is well-developed.  HENT:     Head: Normocephalic and atraumatic.     Mouth/Throat:     Mouth: Mucous membranes are moist.     Pharynx: Oropharynx is clear.  Eyes:     Pupils: Pupils are equal, round, and reactive to light.  Cardiovascular:     Rate and Rhythm: Normal rate and regular rhythm.  Pulmonary:     Effort: No respiratory distress.     Breath sounds: No stridor.  Abdominal:      General: Abdomen is flat. There is no distension.  Musculoskeletal:        General: Tenderness (To her right thigh and left shoulder) present. Normal range of motion.     Cervical back: Normal range of motion.  Skin:    General: Skin is warm and dry.  Neurological:     General: No focal deficit present.     Mental Status: She is alert.    ED Results / Procedures / Treatments   Labs (all labs ordered are listed, but only abnormal results are displayed) Labs Reviewed  CBC WITH DIFFERENTIAL/PLATELET - Abnormal; Notable for the following components:      Result Value   RBC 3.66 (*)    Hemoglobin 11.1 (*)    All other components within normal limits  COMPREHENSIVE METABOLIC PANEL - Abnormal; Notable for the following components:   Glucose, Bld 194 (*)    Albumin 2.8 (*)    All other components within normal limits  TYPE AND SCREEN  ABO/RH    EKG None  Radiology CT HEAD WO CONTRAST (5MM)  Result Date: 04/12/2021 CLINICAL DATA:  Head trauma EXAM: CT HEAD WITHOUT CONTRAST TECHNIQUE: Contiguous axial images were obtained from the base of the  skull through the vertex without intravenous contrast. COMPARISON:  03/30/2021 FINDINGS: Brain: No evidence of acute infarction, hemorrhage, hydrocephalus, extra-axial collection or mass lesion/mass effect. 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 Vascular: Intracranial atherosclerosis. Skull: Normal. Negative for fracture or focal lesion. Sinuses/Orbits: The visualized paranasal sinuses are essentially clear. The mastoid air cells are unopacified. Other: None. IMPRESSION: No evidence of acute intracranial abnormality. Electronically Signed   By: Julian Hy M.D.   On: 04/12/2021 02:17   DG Pelvis Portable  Result Date: 04/12/2021 CLINICAL DATA:  Level 2 trauma.  Fall. EXAM: RIGHT FEMUR 1 VIEW; PORTABLE PELVIS 1-2 VIEWS COMPARISON:  Pelvic radiograph dated 03/30/2021. FINDINGS: Evaluation is limited due to osteopenia and  body habitus. No acute fracture or dislocation. Osteopenia. Degenerative changes of the right hip. The soft tissues are grossly unremarkable. Several surgical staples noted over the pelvis. IMPRESSION: No acute findings. Electronically Signed   By: Anner Crete M.D.   On: 04/12/2021 02:00   DG Chest Portable 1 View  Result Date: 04/12/2021 CLINICAL DATA:  Level 2 fall EXAM: PORTABLE CHEST 1 VIEW COMPARISON:  03/30/2021 FINDINGS: Subpleural opacities/reticulation in the lungs bilaterally, chronic, favoring interstitial lung disease. Atypical infection is also possible but is not favored given lack of interval change. No pleural effusion or pneumothorax. The heart is normal in size. Postsurgical changes related to prior CABG. Median sternotomy. IMPRESSION: Suspected chronic interstitial lung disease. No evidence of acute cardiopulmonary disease. Electronically Signed   By: Julian Hy M.D.   On: 04/12/2021 01:59   DG Shoulder Left Portable  Result Date: 04/12/2021 CLINICAL DATA:  Level 2 fall EXAM: LEFT SHOULDER COMPARISON:  None. FINDINGS: No fracture or dislocation is seen. The joint spaces are preserved. Visualized soft tissues are within normal limits. IMPRESSION: Negative. Electronically Signed   By: Julian Hy M.D.   On: 04/12/2021 01:59   DG FEMUR 1V RIGHT  Result Date: 04/12/2021 CLINICAL DATA:  Level 2 trauma.  Fall. EXAM: RIGHT FEMUR 1 VIEW; PORTABLE PELVIS 1-2 VIEWS COMPARISON:  Pelvic radiograph dated 03/30/2021. FINDINGS: Evaluation is limited due to osteopenia and body habitus. No acute fracture or dislocation. Osteopenia. Degenerative changes of the right hip. The soft tissues are grossly unremarkable. Several surgical staples noted over the pelvis. IMPRESSION: No acute findings. Electronically Signed   By: Anner Crete M.D.   On: 04/12/2021 02:00    Procedures Procedures   Medications Ordered in ED Medications - No data to display  ED Course  I have reviewed  the triage vital signs and the nursing notes.  Pertinent labs & imaging results that were available during my care of the patient were reviewed by me and considered in my medical decision making (see chart for details).    MDM Rules/Calculators/A&P                          65 year old female with mechanical fall and no obvious injuries on exam.  Head CT done secondary to being on Eliquis and this did not show any significant abnormalities.  Patient has a chronic fibromyalgia pain but no other associated symptoms.  Patient was discharged in stable condition.  Final Clinical Impression(s) / ED Diagnoses Final diagnoses:  Fall, initial encounter    Rx / DC Orders ED Discharge Orders     None        Dale Strausser, Corene Cornea, MD 04/12/21 2316

## 2021-04-16 ENCOUNTER — Other Ambulatory Visit: Payer: Self-pay | Admitting: Physical Medicine & Rehabilitation

## 2021-04-16 ENCOUNTER — Other Ambulatory Visit: Payer: Self-pay | Admitting: Cardiology

## 2021-04-16 DIAGNOSIS — I509 Heart failure, unspecified: Secondary | ICD-10-CM

## 2021-04-16 DIAGNOSIS — M47816 Spondylosis without myelopathy or radiculopathy, lumbar region: Secondary | ICD-10-CM

## 2021-04-16 DIAGNOSIS — E1142 Type 2 diabetes mellitus with diabetic polyneuropathy: Secondary | ICD-10-CM

## 2021-04-17 NOTE — Telephone Encounter (Signed)
Prescription refill request for Eliquis received. Indication:afib Last office visit:donna carroll 8/27/21OVERDUE Scr:0.92 04/12/21 Age: 87f Weight:105.7kg

## 2021-04-19 ENCOUNTER — Emergency Department (HOSPITAL_COMMUNITY): Payer: Medicare Other

## 2021-04-19 ENCOUNTER — Inpatient Hospital Stay (HOSPITAL_COMMUNITY)
Admission: EM | Admit: 2021-04-19 | Discharge: 2021-04-22 | DRG: 391 | Disposition: A | Discharge status: Home Health | Payer: Medicare Other | Attending: Internal Medicine | Admitting: Internal Medicine

## 2021-04-19 ENCOUNTER — Encounter (HOSPITAL_COMMUNITY): Payer: Self-pay | Admitting: Emergency Medicine

## 2021-04-19 DIAGNOSIS — Z91048 Other nonmedicinal substance allergy status: Secondary | ICD-10-CM

## 2021-04-19 DIAGNOSIS — N186 End stage renal disease: Secondary | ICD-10-CM | POA: Diagnosis not present

## 2021-04-19 DIAGNOSIS — I480 Paroxysmal atrial fibrillation: Secondary | ICD-10-CM | POA: Diagnosis present

## 2021-04-19 DIAGNOSIS — Z955 Presence of coronary angioplasty implant and graft: Secondary | ICD-10-CM

## 2021-04-19 DIAGNOSIS — E1159 Type 2 diabetes mellitus with other circulatory complications: Secondary | ICD-10-CM | POA: Diagnosis present

## 2021-04-19 DIAGNOSIS — E1142 Type 2 diabetes mellitus with diabetic polyneuropathy: Secondary | ICD-10-CM | POA: Diagnosis present

## 2021-04-19 DIAGNOSIS — R778 Other specified abnormalities of plasma proteins: Secondary | ICD-10-CM

## 2021-04-19 DIAGNOSIS — Z66 Do not resuscitate: Secondary | ICD-10-CM | POA: Diagnosis not present

## 2021-04-19 DIAGNOSIS — G4733 Obstructive sleep apnea (adult) (pediatric): Secondary | ICD-10-CM | POA: Diagnosis present

## 2021-04-19 DIAGNOSIS — Z794 Long term (current) use of insulin: Secondary | ICD-10-CM

## 2021-04-19 DIAGNOSIS — E1122 Type 2 diabetes mellitus with diabetic chronic kidney disease: Secondary | ICD-10-CM | POA: Diagnosis present

## 2021-04-19 DIAGNOSIS — Z8614 Personal history of Methicillin resistant Staphylococcus aureus infection: Secondary | ICD-10-CM

## 2021-04-19 DIAGNOSIS — I252 Old myocardial infarction: Secondary | ICD-10-CM

## 2021-04-19 DIAGNOSIS — Z8249 Family history of ischemic heart disease and other diseases of the circulatory system: Secondary | ICD-10-CM

## 2021-04-19 DIAGNOSIS — I517 Cardiomegaly: Secondary | ICD-10-CM | POA: Diagnosis not present

## 2021-04-19 DIAGNOSIS — Z7902 Long term (current) use of antithrombotics/antiplatelets: Secondary | ICD-10-CM

## 2021-04-19 DIAGNOSIS — R0602 Shortness of breath: Secondary | ICD-10-CM

## 2021-04-19 DIAGNOSIS — Z79899 Other long term (current) drug therapy: Secondary | ICD-10-CM

## 2021-04-19 DIAGNOSIS — K760 Fatty (change of) liver, not elsewhere classified: Secondary | ICD-10-CM | POA: Diagnosis not present

## 2021-04-19 DIAGNOSIS — E1169 Type 2 diabetes mellitus with other specified complication: Secondary | ICD-10-CM | POA: Diagnosis present

## 2021-04-19 DIAGNOSIS — Z951 Presence of aortocoronary bypass graft: Secondary | ICD-10-CM

## 2021-04-19 DIAGNOSIS — R109 Unspecified abdominal pain: Secondary | ICD-10-CM | POA: Diagnosis not present

## 2021-04-19 DIAGNOSIS — R079 Chest pain, unspecified: Secondary | ICD-10-CM | POA: Diagnosis not present

## 2021-04-19 DIAGNOSIS — A084 Viral intestinal infection, unspecified: Secondary | ICD-10-CM | POA: Diagnosis not present

## 2021-04-19 DIAGNOSIS — R110 Nausea: Secondary | ICD-10-CM | POA: Diagnosis not present

## 2021-04-19 DIAGNOSIS — Z9049 Acquired absence of other specified parts of digestive tract: Secondary | ICD-10-CM

## 2021-04-19 DIAGNOSIS — E860 Dehydration: Secondary | ICD-10-CM | POA: Diagnosis present

## 2021-04-19 DIAGNOSIS — N179 Acute kidney failure, unspecified: Secondary | ICD-10-CM | POA: Diagnosis not present

## 2021-04-19 DIAGNOSIS — I248 Other forms of acute ischemic heart disease: Secondary | ICD-10-CM | POA: Diagnosis present

## 2021-04-19 DIAGNOSIS — M069 Rheumatoid arthritis, unspecified: Secondary | ICD-10-CM | POA: Diagnosis present

## 2021-04-19 DIAGNOSIS — J9611 Chronic respiratory failure with hypoxia: Secondary | ICD-10-CM | POA: Diagnosis present

## 2021-04-19 DIAGNOSIS — E1151 Type 2 diabetes mellitus with diabetic peripheral angiopathy without gangrene: Secondary | ICD-10-CM | POA: Diagnosis present

## 2021-04-19 DIAGNOSIS — F17210 Nicotine dependence, cigarettes, uncomplicated: Secondary | ICD-10-CM | POA: Diagnosis present

## 2021-04-19 DIAGNOSIS — Z7952 Long term (current) use of systemic steroids: Secondary | ICD-10-CM

## 2021-04-19 DIAGNOSIS — R197 Diarrhea, unspecified: Secondary | ICD-10-CM

## 2021-04-19 DIAGNOSIS — I10 Essential (primary) hypertension: Secondary | ICD-10-CM | POA: Diagnosis not present

## 2021-04-19 DIAGNOSIS — Z881 Allergy status to other antibiotic agents status: Secondary | ICD-10-CM

## 2021-04-19 DIAGNOSIS — Z888 Allergy status to other drugs, medicaments and biological substances status: Secondary | ICD-10-CM

## 2021-04-19 DIAGNOSIS — R0789 Other chest pain: Secondary | ICD-10-CM | POA: Diagnosis not present

## 2021-04-19 DIAGNOSIS — Z789 Other specified health status: Secondary | ICD-10-CM

## 2021-04-19 DIAGNOSIS — Z8673 Personal history of transient ischemic attack (TIA), and cerebral infarction without residual deficits: Secondary | ICD-10-CM

## 2021-04-19 DIAGNOSIS — J449 Chronic obstructive pulmonary disease, unspecified: Secondary | ICD-10-CM | POA: Diagnosis present

## 2021-04-19 DIAGNOSIS — Z7951 Long term (current) use of inhaled steroids: Secondary | ICD-10-CM

## 2021-04-19 DIAGNOSIS — E785 Hyperlipidemia, unspecified: Secondary | ICD-10-CM | POA: Diagnosis present

## 2021-04-19 DIAGNOSIS — D84821 Immunodeficiency due to drugs: Secondary | ICD-10-CM | POA: Diagnosis not present

## 2021-04-19 DIAGNOSIS — Z20822 Contact with and (suspected) exposure to covid-19: Secondary | ICD-10-CM | POA: Diagnosis present

## 2021-04-19 DIAGNOSIS — K219 Gastro-esophageal reflux disease without esophagitis: Secondary | ICD-10-CM | POA: Diagnosis present

## 2021-04-19 DIAGNOSIS — Z6841 Body Mass Index (BMI) 40.0 and over, adult: Secondary | ICD-10-CM

## 2021-04-19 DIAGNOSIS — Z882 Allergy status to sulfonamides status: Secondary | ICD-10-CM

## 2021-04-19 DIAGNOSIS — R296 Repeated falls: Secondary | ICD-10-CM | POA: Diagnosis present

## 2021-04-19 DIAGNOSIS — Z940 Kidney transplant status: Secondary | ICD-10-CM

## 2021-04-19 DIAGNOSIS — Z9981 Dependence on supplemental oxygen: Secondary | ICD-10-CM

## 2021-04-19 DIAGNOSIS — M797 Fibromyalgia: Secondary | ICD-10-CM | POA: Diagnosis present

## 2021-04-19 DIAGNOSIS — E876 Hypokalemia: Secondary | ICD-10-CM | POA: Diagnosis present

## 2021-04-19 DIAGNOSIS — Z885 Allergy status to narcotic agent status: Secondary | ICD-10-CM

## 2021-04-19 DIAGNOSIS — Z7901 Long term (current) use of anticoagulants: Secondary | ICD-10-CM

## 2021-04-19 DIAGNOSIS — Z87442 Personal history of urinary calculi: Secondary | ICD-10-CM

## 2021-04-19 DIAGNOSIS — R300 Dysuria: Secondary | ICD-10-CM | POA: Diagnosis present

## 2021-04-19 DIAGNOSIS — R112 Nausea with vomiting, unspecified: Secondary | ICD-10-CM | POA: Diagnosis not present

## 2021-04-19 DIAGNOSIS — E11649 Type 2 diabetes mellitus with hypoglycemia without coma: Secondary | ICD-10-CM | POA: Diagnosis present

## 2021-04-19 DIAGNOSIS — I5032 Chronic diastolic (congestive) heart failure: Secondary | ICD-10-CM | POA: Diagnosis present

## 2021-04-19 DIAGNOSIS — H547 Unspecified visual loss: Secondary | ICD-10-CM | POA: Diagnosis present

## 2021-04-19 DIAGNOSIS — F419 Anxiety disorder, unspecified: Secondary | ICD-10-CM | POA: Diagnosis present

## 2021-04-19 DIAGNOSIS — F32A Depression, unspecified: Secondary | ICD-10-CM | POA: Diagnosis present

## 2021-04-19 DIAGNOSIS — I132 Hypertensive heart and chronic kidney disease with heart failure and with stage 5 chronic kidney disease, or end stage renal disease: Secondary | ICD-10-CM | POA: Diagnosis present

## 2021-04-19 DIAGNOSIS — I2510 Atherosclerotic heart disease of native coronary artery without angina pectoris: Secondary | ICD-10-CM | POA: Diagnosis present

## 2021-04-19 LAB — CBC
HCT: 38.3 % (ref 36.0–46.0)
Hemoglobin: 11.8 g/dL — ABNORMAL LOW (ref 12.0–15.0)
MCH: 29.9 pg (ref 26.0–34.0)
MCHC: 30.8 g/dL (ref 30.0–36.0)
MCV: 97.2 fL (ref 80.0–100.0)
Platelets: 363 10*3/uL (ref 150–400)
RBC: 3.94 MIL/uL (ref 3.87–5.11)
RDW: 15.4 % (ref 11.5–15.5)
WBC: 9 10*3/uL (ref 4.0–10.5)
nRBC: 0 % (ref 0.0–0.2)

## 2021-04-19 LAB — BASIC METABOLIC PANEL
Anion gap: 12 (ref 5–15)
BUN: 8 mg/dL (ref 8–23)
CO2: 20 mmol/L — ABNORMAL LOW (ref 22–32)
Calcium: 9.1 mg/dL (ref 8.9–10.3)
Chloride: 108 mmol/L (ref 98–111)
Creatinine, Ser: 1.33 mg/dL — ABNORMAL HIGH (ref 0.44–1.00)
GFR, Estimated: 45 mL/min — ABNORMAL LOW (ref >60)
Glucose, Bld: 190 mg/dL — ABNORMAL HIGH (ref 70–99)
Potassium: 3.4 mmol/L — ABNORMAL LOW (ref 3.5–5.1)
Sodium: 140 mmol/L (ref 135–145)

## 2021-04-19 LAB — RESP PANEL BY RT-PCR (FLU A&B, COVID) ARPGX2
Influenza A by PCR: NEGATIVE
Influenza B by PCR: NEGATIVE
SARS Coronavirus 2 by RT PCR: NEGATIVE

## 2021-04-19 LAB — HEPATIC FUNCTION PANEL
ALT: 10 U/L (ref 0–44)
AST: 33 U/L (ref 15–41)
Albumin: 2.5 g/dL — ABNORMAL LOW (ref 3.5–5.0)
Alkaline Phosphatase: 116 U/L (ref 38–126)
Bilirubin, Direct: 0.3 mg/dL — ABNORMAL HIGH (ref 0.0–0.2)
Indirect Bilirubin: 0.4 mg/dL (ref 0.3–0.9)
Total Bilirubin: 0.7 mg/dL (ref 0.3–1.2)
Total Protein: 6.7 g/dL (ref 6.5–8.1)

## 2021-04-19 LAB — TROPONIN I (HIGH SENSITIVITY)
Troponin I (High Sensitivity): 60 ng/L — ABNORMAL HIGH (ref <18)
Troponin I (High Sensitivity): 56 ng/L — ABNORMAL HIGH (ref <18)

## 2021-04-19 LAB — GLUCOSE, CAPILLARY: Glucose-Capillary: 100 mg/dL — ABNORMAL HIGH (ref 70–99)

## 2021-04-19 LAB — MAGNESIUM: Magnesium: 1.6 mg/dL — ABNORMAL LOW (ref 1.7–2.4)

## 2021-04-19 LAB — LIPASE, BLOOD: Lipase: 17 U/L (ref 11–51)

## 2021-04-19 MED ORDER — CLOPIDOGREL BISULFATE 75 MG PO TABS
75.0000 mg | ORAL_TABLET | Freq: Every day | ORAL | Status: DC
  Administered 2021-04-20 – 2021-04-22 (×3): 75 mg via ORAL
  Filled 2021-04-19 (×3): qty 1

## 2021-04-19 MED ORDER — SERTRALINE HCL 100 MG PO TABS
100.0000 mg | ORAL_TABLET | Freq: Every morning | ORAL | Status: DC
  Administered 2021-04-20 – 2021-04-22 (×3): 100 mg via ORAL
  Filled 2021-04-19 (×3): qty 1

## 2021-04-19 MED ORDER — ROSUVASTATIN CALCIUM 20 MG PO TABS
40.0000 mg | ORAL_TABLET | Freq: Every day | ORAL | Status: DC
  Administered 2021-04-20 – 2021-04-22 (×3): 40 mg via ORAL
  Filled 2021-04-19 (×3): qty 2

## 2021-04-19 MED ORDER — LACTATED RINGERS IV SOLN: INTRAVENOUS | Status: AC

## 2021-04-19 MED ORDER — SODIUM CHLORIDE 0.9 % IV SOLN
12.5000 mg | Freq: Four times a day (QID) | INTRAVENOUS | Status: DC | PRN
  Administered 2021-04-19 – 2021-04-21 (×2): 12.5 mg via INTRAVENOUS
  Filled 2021-04-19 (×3): qty 0.5

## 2021-04-19 MED ORDER — ACETAMINOPHEN 650 MG RE SUPP: 650.0000 mg | Freq: Four times a day (QID) | RECTAL | Status: DC | PRN

## 2021-04-19 MED ORDER — ISOSORBIDE MONONITRATE ER 60 MG PO TB24
60.0000 mg | ORAL_TABLET | Freq: Every day | ORAL | Status: DC
  Administered 2021-04-20 – 2021-04-22 (×3): 60 mg via ORAL
  Filled 2021-04-19 (×3): qty 1

## 2021-04-19 MED ORDER — LAMOTRIGINE 25 MG PO TABS
25.0000 mg | ORAL_TABLET | Freq: Every day | ORAL | Status: DC
  Administered 2021-04-19 – 2021-04-21 (×3): 25 mg via ORAL
  Filled 2021-04-19 (×4): qty 1

## 2021-04-19 MED ORDER — POTASSIUM CHLORIDE 10 MEQ/100ML IV SOLN
10.0000 meq | INTRAVENOUS | Status: AC
  Administered 2021-04-19 – 2021-04-20 (×5): 10 meq via INTRAVENOUS
  Filled 2021-04-19 (×5): qty 100

## 2021-04-19 MED ORDER — IOHEXOL 350 MG/ML SOLN
75.0000 mL | Freq: Once | INTRAVENOUS | Status: AC | PRN
  Administered 2021-04-19: 75 mL via INTRAVENOUS

## 2021-04-19 MED ORDER — INSULIN ASPART 100 UNIT/ML IJ SOLN
0.0000 [IU] | INTRAMUSCULAR | Status: DC
  Administered 2021-04-20 (×2): 3 [IU] via SUBCUTANEOUS
  Administered 2021-04-20: 5 [IU] via SUBCUTANEOUS
  Administered 2021-04-21: 8 [IU] via SUBCUTANEOUS
  Administered 2021-04-21: 5 [IU] via SUBCUTANEOUS
  Administered 2021-04-21: 2 [IU] via SUBCUTANEOUS
  Administered 2021-04-21 (×2): 3 [IU] via SUBCUTANEOUS
  Administered 2021-04-22: 5 [IU] via SUBCUTANEOUS
  Administered 2021-04-22 (×2): 2 [IU] via SUBCUTANEOUS
  Administered 2021-04-22: 5 [IU] via SUBCUTANEOUS

## 2021-04-19 MED ORDER — METOPROLOL TARTRATE 100 MG PO TABS
100.0000 mg | ORAL_TABLET | Freq: Two times a day (BID) | ORAL | Status: DC
  Administered 2021-04-20 – 2021-04-22 (×5): 100 mg via ORAL
  Filled 2021-04-19 (×5): qty 1

## 2021-04-19 MED ORDER — ARFORMOTEROL TARTRATE 15 MCG/2ML IN NEBU
15.0000 ug | INHALATION_SOLUTION | Freq: Two times a day (BID) | RESPIRATORY_TRACT | Status: DC
  Administered 2021-04-19 – 2021-04-22 (×6): 15 ug via RESPIRATORY_TRACT
  Filled 2021-04-19 (×6): qty 2

## 2021-04-19 MED ORDER — AZATHIOPRINE 50 MG PO TABS
125.0000 mg | ORAL_TABLET | Freq: Every day | ORAL | Status: DC
  Administered 2021-04-20 – 2021-04-21 (×2): 125 mg via ORAL
  Filled 2021-04-19 (×3): qty 3

## 2021-04-19 MED ORDER — PANTOPRAZOLE SODIUM 40 MG PO TBEC
40.0000 mg | DELAYED_RELEASE_TABLET | Freq: Every day | ORAL | Status: DC
  Administered 2021-04-20 – 2021-04-22 (×3): 40 mg via ORAL
  Filled 2021-04-19 (×3): qty 1

## 2021-04-19 MED ORDER — BUDESONIDE 0.5 MG/2ML IN SUSP
2.0000 mL | Freq: Two times a day (BID) | RESPIRATORY_TRACT | Status: DC
  Administered 2021-04-19 – 2021-04-22 (×6): 0.5 mg via RESPIRATORY_TRACT
  Filled 2021-04-19 (×6): qty 2

## 2021-04-19 MED ORDER — INSULIN GLARGINE-YFGN 100 UNIT/ML ~~LOC~~ SOLN
15.0000 [IU] | Freq: Every day | SUBCUTANEOUS | Status: DC
  Administered 2021-04-20 – 2021-04-22 (×3): 15 [IU] via SUBCUTANEOUS
  Filled 2021-04-19 (×4): qty 0.15

## 2021-04-19 MED ORDER — SODIUM CHLORIDE 0.9% FLUSH
3.0000 mL | Freq: Two times a day (BID) | INTRAVENOUS | Status: DC
  Administered 2021-04-19 – 2021-04-22 (×5): 3 mL via INTRAVENOUS

## 2021-04-19 MED ORDER — PREDNISONE 5 MG PO TABS
5.0000 mg | ORAL_TABLET | Freq: Every morning | ORAL | Status: DC
  Administered 2021-04-20 – 2021-04-22 (×3): 5 mg via ORAL
  Filled 2021-04-19 (×3): qty 1

## 2021-04-19 MED ORDER — MONTELUKAST SODIUM 10 MG PO TABS
10.0000 mg | ORAL_TABLET | Freq: Every day | ORAL | Status: DC
  Administered 2021-04-19 – 2021-04-21 (×3): 10 mg via ORAL
  Filled 2021-04-19 (×3): qty 1

## 2021-04-19 MED ORDER — SODIUM CHLORIDE 0.9 % IV BOLUS
1000.0000 mL | Freq: Once | INTRAVENOUS | Status: AC
  Administered 2021-04-19: 1000 mL via INTRAVENOUS

## 2021-04-19 MED ORDER — DULOXETINE HCL 60 MG PO CPEP
60.0000 mg | ORAL_CAPSULE | Freq: Every morning | ORAL | Status: DC
  Administered 2021-04-20 – 2021-04-22 (×3): 60 mg via ORAL
  Filled 2021-04-19 (×3): qty 1

## 2021-04-19 MED ORDER — OXYCODONE HCL 5 MG PO TABS
10.0000 mg | ORAL_TABLET | Freq: Four times a day (QID) | ORAL | Status: DC | PRN
  Administered 2021-04-20 – 2021-04-22 (×5): 10 mg via ORAL
  Filled 2021-04-19 (×6): qty 2

## 2021-04-19 MED ORDER — APIXABAN 5 MG PO TABS
5.0000 mg | ORAL_TABLET | Freq: Two times a day (BID) | ORAL | Status: DC
  Administered 2021-04-19 – 2021-04-22 (×6): 5 mg via ORAL
  Filled 2021-04-19 (×6): qty 1

## 2021-04-19 MED ORDER — ONDANSETRON HCL 4 MG/2ML IJ SOLN: 4.0000 mg | Freq: Four times a day (QID) | INTRAMUSCULAR | Status: DC | PRN

## 2021-04-19 MED ORDER — OXYCODONE HCL 5 MG PO TABS
10.0000 mg | ORAL_TABLET | Freq: Once | ORAL | Status: AC
  Administered 2021-04-19: 10 mg via ORAL
  Filled 2021-04-19: qty 2

## 2021-04-19 MED ORDER — LORAZEPAM 1 MG PO TABS
1.0000 mg | ORAL_TABLET | Freq: Two times a day (BID) | ORAL | Status: DC | PRN
  Administered 2021-04-19 – 2021-04-21 (×3): 1 mg via ORAL
  Filled 2021-04-19 (×3): qty 1

## 2021-04-19 MED ORDER — ALBUTEROL SULFATE (2.5 MG/3ML) 0.083% IN NEBU: 3.0000 mL | INHALATION_SOLUTION | Freq: Four times a day (QID) | RESPIRATORY_TRACT | Status: DC | PRN

## 2021-04-19 MED ORDER — ACETAMINOPHEN 325 MG PO TABS
650.0000 mg | ORAL_TABLET | Freq: Four times a day (QID) | ORAL | Status: DC | PRN
  Administered 2021-04-19 – 2021-04-22 (×5): 650 mg via ORAL
  Filled 2021-04-19 (×5): qty 2

## 2021-04-19 MED ORDER — ONDANSETRON HCL 4 MG PO TABS
4.0000 mg | ORAL_TABLET | Freq: Four times a day (QID) | ORAL | Status: DC | PRN
  Administered 2021-04-19: 4 mg via ORAL
  Filled 2021-04-19: qty 1

## 2021-04-19 NOTE — ED Notes (Signed)
Attempted IV x2. 

## 2021-04-19 NOTE — H&P (Signed)
History and Physical    Katherine [name] HWT:[id number] DOB: 30-May-1956 DOA: 04/19/2021  PCP: [name], MD  Patient coming from: Home  I have personally briefly reviewed patient's old medical records in [place]  Chief Complaint: Nausea, vomiting  HPI: Katherine [name] is a 65 y.o. female with medical history significant for ESRD s/p renal transplant on chronic immunosuppression, CAD s/p CABG and multiple PCI most recently DES to SVG 14 February 2021, PAF on Eliquis, chronic diastolic CHF, COPD, chronic respiratory failure with hypoxia on 3 L supplemental O2 via [place] with exertion and sleep, insulin-dependent T2DM, HTN, HLD, depression/anxiety, chronic pain who presented to the ED for evaluation of nausea and vomiting.  Patient reports new onset nausea, vomiting, and diarrhea beginning 2 days ago (04/17/2021).  She has not been able to maintain any adequate oral intake or even take her medications.  She says she was having frequent watery diarrhea which is beginning to resolve with last occurrence yesterday.  She had a soft bowel movement earlier today.  She also has been having persistent dysuria despite being treated with an antibiotic as an outpatient.  She has been having abdominal pain across the upper aspect of her abdomen.  She is also feeling fatigued and generally weak.  She says she lives alone and requires the use of a cane/walker with ambulation.  ED Course:  Initial vitals showed BP 131/67, pulse 66, RR 18, temp 98.5 F, SPO2 100% on 3 L supplemental O2 via [place].  Labs show sodium 140, potassium 3.4, bicarb 20, BUN 8, creatinine 1.33 (baseline 0.8-0.9), serum glucose 190, WBC 9.0, hemoglobin 11.8, platelets 363,000, high-sensitivity troponin I 60 > 56, lipase 17.  SARS-CoV-2 PCR is negative.  Influenza is negative.  2 view chest x-ray shows mild cardiomegaly and prior sternotomy and CABG changes without focal consolidation, edema, or effusion.  Several sternotomy wires are  noted to be chronically fractured.  CT abdomen/pelvis with contrast was negative for acute abnormality.  Left lower quadrant transplanted kidney without transplant hydronephrosis or perinephric edema.  Chronic appearing mild bladder wall thickening about the dome is noted.  Hepatic steatosis, colonic diverticulosis without diverticulitis, and heterogenous pulmonary parenchyma in the lung bases suggestive of small vessel or small airway disease noted.  Subpleural groundglass opacity in the right lower lobe not significantly changed compared to prior CT chest January 2021.  Patient was given 1 L normal saline, 10 mg Oxy IR, and IV Phenergan.  The hospitalist service was consulted to admit for further evaluation and management.  Review of Systems: All systems reviewed and are negative except as documented in history of present illness above.   Past Medical History:  Diagnosis Date   Anemia    Anxiety    Bilateral carotid artery stenosis    Carotid duplex 04/1790: 5-05% LICA, 69-79% RICA, >48% RECA, f/u 1 yr suggested   CAD (coronary artery disease) of bypass graft 5/01; 3/'02, 8/'03, 10/'04; 1/15   PCI x 5 to SVG-D1    CAD in native artery 07/1993   3 Vessel Disease (LAD-D1 & RCA) -- CABG (Dx in setting of inferior STEMI-PTCA of RCA)   CAD S/P percutaneous coronary angioplasty    PCI to SVG-D1 insertion/native D1 x 4 = '01 -(S660 BMS 2.5 x 9 anastomosis- D1); '02 - distal overlap ACS Pixel 2.5 x 8  BMS; '03 distal/native ISR/Thrombosis - Pixel 2.5 x 13; '04 - ISR-  Taxus 2.5 x 20 (covered all);; 1/15 - mid SVG-D1 (50%  distal ISR) - Promus P 2.75 x 20 -- 2.8 mm   COPD mixed type (HCC)    Followed by Dr. Lamonte Sakai "pulmonologist said no COPD"   Depression    Depression with anxiety    Diabetes mellitus type 2 in obese ([place])    Diarrhea    started after cholecystectomy and mass removed from intestine   Dyslipidemia, goal LDL below 70    08/2012: TC 137, TG 200, HDL 32!, LDL 45; on statin (followed  by Dr.Deterding)   ESRD (end stage renal disease) ([place]) 1991   s/p Cadaveric Renal Transplant [facility] - Dr. Jimmy Footman)    Family history of adverse reaction to anesthesia    mom's bp dropped during/after anesthesia   Fibromyalgia    GERD (gastroesophageal reflux disease)    Glomerulonephritis, chronic, rapidly progressive 1989   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 07/1993   Rescue PTCA of RCA -- referred for CABG.   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 Servando Snare: LIMA-LAD, SVG-bifurcatingD1, SVG-rPDA   History of kidney stones    History of stroke 2012   "right eye stroke- half blind now"   Hypertension associated with diabetes ([place])    Mild aortic stenosis by prior echocardiogram 07/2019   Echo:  Mild aortic stenosis (gradients: Mean 14.3 mmHg -peak 24.9 mmHg).   Morbid obesity (HCC)    MRSA (methicillin resistant staph aureus) culture positive    OSA (obstructive sleep apnea)    no longer on CPAP or home O2, states she doesn't need now after lap band   PAD (peripheral artery disease) ([place]) 08/2013   LEA Dopplers to be read by Dr. Fletcher Anon   PAF (paroxysmal atrial fibrillation) ([place]) 06/2014   Noted on CardioNet Monitor  - --> rhythm control with Tikosyn (Dr. Rayann Heman); converted from warfarin to apixaban for anticoagulation.   Pneumonia    Recurrent boils    Bilateral Groin   Rheumatoid arthritis ([place])    Per Patient Report; associated with OA   S/p cadaver renal transplant 1991   [facility]   Unstable angina ([place]) 5/01; 3/'02, 8/'03, 10/'04; 1/15   x 5 occurences since Inf-Post STEMI in 1994    Past Surgical History:  Procedure Laterality Date   ABDOMINAL AORTOGRAM N/A 04/21/2018   Procedure: ABDOMINAL AORTOGRAM;  Surgeon: [name], MD;  Location: [place] [facility];  Service: Cardiovascular;  Laterality: N/A;   CATHETER REMOVAL     CHOLECYSTECTOMY N/A 10/29/2014   Procedure: LAPAROSCOPIC  CHOLECYSTECTOMY WITH INTRAOPERATIVE CHOLANGIOGRAM;  Surgeon: Excell Seltzer, MD;  Location: [facility];  Service: General;  Laterality: N/A;   CORONARY ANGIOPLASTY  1994   x5   CORONARY ARTERY BYPASS GRAFT  1995   LIMA-LAD, SVG-RPDA, SVG-D1   CORONARY STENT INTERVENTION N/A 02/20/2021   Procedure: CORONARY STENT INTERVENTION;  Surgeon: [name], MD;  Location: [place] [facility];  Service: Cardiovascular;  Laterality: N/A;   ESOPHAGOGASTRODUODENOSCOPY N/A 10/15/2016   Procedure: ESOPHAGOGASTRODUODENOSCOPY (EGD);  Surgeon: Wilford Corner, MD;  Location: [facility];  Service: Endoscopy;  Laterality: N/A;   I & D EXTREMITY Right 01/29/2018   Procedure: IRRIGATION AND DEBRIDEMENT THUMB;  Surgeon: Dayna Barker, MD;  Location: [place];  Service: Plastics;  Laterality: Right;   INCISE AND DRAIN ABCESS     INTRAVASCULAR PRESSURE WIRE/FFR STUDY N/A 02/20/2021   Procedure: INTRAVASCULAR PRESSURE WIRE/FFR STUDY;  Surgeon: Ellyn Hack,  Leonie Green, MD;  Location: [place] CV LAB;  Service: Cardiovascular;  Laterality: N/A;   KIDNEY TRANSPLANT  1991   KNEE ARTHROSCOPY WITH LATERAL MENISECTOMY Left 12/03/2017   Procedure: LEFT KNEE ARTHROSCOPY WITH LATERAL MENISECTOMY;  Surgeon: Earlie Server, MD;  Location: [place];  Service: Orthopedics;  Laterality: Left;   LAPAROSCOPIC GASTRIC BANDING  04/2004; 10/'09, 2/'10   Port Replacement x 2   LEFT HEART CATH AND CORONARY ANGIOGRAPHY N/A 02/20/2021   Procedure: LEFT HEART CATH AND CORONARY ANGIOGRAPHY;  Surgeon: [name], MD;  Location: [place] [facility];  Service: Cardiovascular;  Laterality: N/A;   LEFT HEART CATH AND CORS/GRAFTS ANGIOGRAPHY N/A 04/21/2018   Procedure: LEFT HEART CATH AND CORS/GRAFTS ANGIOGRAPHY;  Surgeon: [name], MD;  Location: [place] [facility];  Ost-Prox LAD 50% - proxLAD (pre & post D1) 100% CTO. Cx - patent, small OM1 (stable ~ ostial OM1 90%, too small for PCI) & 2 small LPL; Ost-distal RCA 100% CTO.  LIMA-LAD (not injected);  SVG-dRCA patent, SVG-D1 - insertion stent ~20% ISR - Severe R CFA disease w/ focal Sub TO   LEFT HEART CATH AND CORS/GRAFTS ANGIOGRAPHY  5/'01, 3/'02, 8/'03, 10/'04; 1/'15   08/22/2013: LAD & RCA 100%; LIMA-LAD & SVG-rPDA patent; Cx-- OM1 60%, OM2 ostial ~50%; SVG-D1 - 80% mid, 50% distal ISR --PCI   LEFT HEART CATH AND CORS/GRAFTS ANGIOGRAPHY N/A 01/31/2021   Procedure: LEFT HEART CATH AND CORS/GRAFTS ANGIOGRAPHY;  Surgeon: Jolaine Artist, MD;  Location: [place] [facility];  Service: Cardiovascular;  Laterality: N/A;   LEFT HEART CATHETERIZATION WITH CORONARY/GRAFT ANGIOGRAM N/A 08/23/2013   Procedure: LEFT HEART CATHETERIZATION WITH Beatrix Fetters;  Surgeon: Wellington Hampshire, MD;  Location: [place] CATH LAB;  Service: Cardiovascular;  Laterality: N/A;   Lower Extremity Arterial Dopplers  08/2013   ABI: R 0.96, L 1.04   MULTIPLE TOOTH EXTRACTIONS  age 41   NM MYOVIEW LTD  03/2016   EF 62%. LOW RISK. C/W prior MI - no Ischemia. Apical hypokinesis.   PERCUTANEOUS CORONARY STENT INTERVENTION (PCI-S)  5/'01, 3/'02, 8/'03, 10/'04;   '01 - S660 BMS 2.5 x 9 - dSVG-D1 into D1; '02- post-stent stenosis - 2.5 x 8 Pixel BMS; '8\03: ISR/Thrombosis into native D1 - AngioJet, 2.5 x 13 Pixel; '04 - ISR 95% - covered stented area with Taxus DES 2.5 mm x 20 (2.88)   PERCUTANEOUS CORONARY STENT INTERVENTION (PCI-S)  08/23/2013   Procedure: PERCUTANEOUS CORONARY STENT INTERVENTION (PCI-S);  Surgeon: Wellington Hampshire, MD;  Location: [facility] C[facility];  Service: Cardiovascular;;mid SVG-D1 80%; distal stent ~50% ISR; Promus [place] DES 2.75 mm xc 20 mm (2.8 mm)   PORT-A-CATH REMOVAL     kidney   TRANSTHORACIC ECHOCARDIOGRAM  07/2019   EF 55 to 60%.  No LVH.  Paradoxical septal wall motion-post CABG.  GRII DD.  Normal RV size and function.  Mild bilateral atrial dilation.  Moderate MAC.  Trace MR.  Mild aortic stenosis (gradients: Mean 14.3 mmHg -peak 24.9 mmHg).   TUBAL LIGATION     wrist fistula repair Left     dialysis for one year    Social History:  reports that she has been smoking cigarettes. She has a 30.00 pack-year smoking history. She has never used smokeless tobacco. She reports that she does not drink alcohol and does not use drugs.  Allergies  Allergen Reactions   Tetracycline Hives    Patient tolerated Doxycycline Dec 2020   Niacin Other (See Comments)  Mouth blisters   Niaspan [Niacin Er] Other (See Comments)    Mouth blisters   Sulfa Antibiotics Nausea Only and Other (See Comments)    "Tears up stomach"   Sulfonamide Derivatives Other (See Comments)    Reaction: per patient "tears her stomach up"   Codeine Nausea And Vomiting   Erythromycin Nausea And Vomiting   Hydromorphone Hcl Nausea And Vomiting   Morphine And Related Nausea And Vomiting   Nalbuphine Nausea And Vomiting    Nubain   Sulfasalazine Nausea Only and Other (See Comments)    per patient "tears her stomach up", "Tears up stomach"   Tape Rash and Other (See Comments)    No "plastic" tape," please----cloth tape only    Family History  Problem Relation Age of Onset   Cancer Mother        liver   Heart disease Father    Cancer Father        colon   Arrhythmia Brother        Atrial Fibrillation   Arrhythmia Paternal Aunt        Atrial Fibrillation     Prior to Admission medications   Medication Sig Start Date End Date Taking? Authorizing Provider  acetaminophen (TYLENOL) 500 MG tablet Take 1,500 mg by mouth 2 (two) times daily.   Yes [provider]  albuterol (VENTOLIN HFA) 108 (90 Base) MCG/ACT inhaler TAKE 2 PUFFS BY MOUTH EVERY 6 HOURS AS NEEDED FOR WHEEZE OR SHORTNESS OF BREATH Patient taking differently: Inhale 2 puffs into the lungs every 6 (six) hours as needed for wheezing or shortness of breath. 02/10/21  Yes Byrum, Rose Fillers, MD  apixaban (ELIQUIS) 5 MG TABS tablet Take 1 tablet (5 mg total) by mouth 2 (two) times daily. APPOINTMENT NEEDED WITH CARDIOLOGIST FOR FURTHER REFILLS  04/17/21  Yes Sherran Needs, NP  azaTHIOprine (IMURAN) 50 MG tablet Take 125 mg by mouth See admin instructions. Take 2 1/2 tablets (125 mg) by mouth daily at 3pm   Yes [provider]  calcitRIOL (ROCALTROL) 0.25 MCG capsule Take 0.25 mcg by mouth every 3 (three) days.    Yes [provider]  clopidogrel (PLAVIX) 75 MG tablet Take 1 tablet (75 mg total) by mouth daily. 02/22/21  Yes Cheryln Manly, NP  clotrimazole (MYCELEX) 10 MG troche Take 1 tablet (10 mg total) by mouth 5 (five) times daily as needed (thrush). 02/03/21  Yes Danford, Suann Larry, MD  cyclobenzaprine (FLEXERIL) 10 MG tablet TAKE 2 TAB DAILY AT BEDTIME, MAY ALSO TAKE 1 TABLET BY MOUTH AT NOON AS NEEDED FOR MUSCLE SPASMS Patient taking differently: Take 10 mg by mouth See admin instructions. TAKE 2 TAB DAILY AT BEDTIME, MAY ALSO TAKE 1 TABLET BY MOUTH AT NOON AS NEEDED FOR MUSCLE SPASMS 04/18/21  Yes Meredith Staggers, MD  diltiazem (CARDIZEM) 30 MG tablet take 1 tablet every 4 hours AS NEEDED for afib heart rate >100 as long as top blood pressure >100. Patient taking differently: Take 30 mg by mouth See admin instructions. take 1 tablet (30 mg) every 4 hours AS NEEDED for afib heart rate >100 as long as top blood pressure >100. 03/04/21  Yes Sherran Needs, NP  dofetilide (TIKOSYN) 250 MCG capsule Take 1 capsule (250 mcg total) by mouth 2 (two) times daily. 03/04/21  Yes Sherran Needs, NP  DULoxetine (CYMBALTA) 30 MG capsule TAKE 2 CAPSULES (60 MG TOTAL) BY MOUTH EVERY MORNING. Patient taking differently: Take 60 mg by mouth  every morning. 10/30/20  Yes Bayard Hugger, NP  fluticasone (FLONASE) 50 MCG/ACT nasal spray PLACE 2 SPRAYS INTO BOTH NOSTRILS 2 TIMES DAILY Patient taking differently: Place 2 sprays into both nostrils 2 (two) times daily. 12/17/20  Yes Collene Gobble, MD  furosemide (LASIX) 40 MG tablet Take 1 tablet (40 mg total) by mouth daily. 07/14/20  Yes Dwyane Dee, MD  glimepiride (AMARYL) 4  MG tablet Take 4 mg by mouth daily. 02/03/20  Yes [provider]  insulin aspart (NOVOLOG FLEXPEN) 100 UNIT/ML FlexPen Inject 14 Units into the skin daily.   Yes [provider]  Insulin Glargine (BASAGLAR KWIKPEN) 100 UNIT/ML SOPN Inject 15 Units into the skin daily before breakfast.   Yes [provider]  isosorbide mononitrate (IMDUR) 60 MG 24 hr tablet Take 1 tablet (60 mg total) by mouth daily. 03/22/21  Yes Debbe Odea, MD  lamoTRIgine (LAMICTAL) 25 MG tablet TAKE 1 TABLET BY MOUTH EVERYDAY AT BEDTIME Patient taking differently: Take 25 mg by mouth at bedtime. 03/05/21  Yes Meredith Staggers, MD  LORazepam (ATIVAN) 1 MG tablet Take 1 tablet (1 mg total) by mouth 2 (two) times daily. 03/20/21  Yes [name], MD  metFORMIN (GLUCOPHAGE) 500 MG tablet Take 1 tablet (500 mg total) by mouth 2 (two) times daily. 02/22/21  Yes Burnell Blanks, MD  metoprolol tartrate (LOPRESSOR) 100 MG tablet Take 1 tablet (100 mg total) by mouth 2 (two) times daily. Appointment Required For Further Refills [phone] Patient taking differently: Take 50 mg by mouth 2 (two) times daily. Appointment Required For Further Refills [phone] 03/22/21  Yes Rizwan, Eunice Blase, MD  montelukast (SINGULAIR) 10 MG tablet TAKE 1 TABLET BY MOUTH EVERYDAY AT BEDTIME Patient taking differently: Take 10 mg by mouth at bedtime. 02/10/21  Yes Collene Gobble, MD  nitroGLYCERIN (NITROSTAT) 0.4 MG SL tablet Place 1 tablet (0.4 mg total) under the tongue every 5 (five) minutes as needed for chest pain. 02/21/21  Yes Burnell Blanks, MD  Oxycodone HCl 10 MG TABS Take 1 tablet (10 mg total) by mouth 4 (four) times daily as needed. 03/28/21  Yes Meredith Staggers, MD  OXYGEN Inhale 3 L into the lungs continuous.   Yes [provider]  pantoprazole (PROTONIX) 40 MG tablet Take 1 tablet (40 mg total) by mouth daily. 02/21/21 05/22/21 Yes Cheryln Manly, NP  predniSONE (DELTASONE) 5 MG tablet Take 5  mg by mouth every morning. 06/19/19  Yes [provider]  promethazine (PHENERGAN) 25 MG tablet Take 1 tablet (25 mg total) by mouth at bedtime as needed for nausea. For nausea and sleep 07/14/20  Yes Dwyane Dee, MD  rosuvastatin (CRESTOR) 40 MG tablet Take 1 tablet (40 mg total) by mouth daily. Patient taking differently: Take 20 mg by mouth daily. 02/03/21  Yes Danford, Suann Larry, MD  sertraline (ZOLOFT) 100 MG tablet Take 100 mg by mouth every morning. 06/12/20  Yes [provider]  amoxicillin (AMOXIL) 250 MG capsule Take 1 capsule (250 mg total) by mouth 3 (three) times daily. Patient not taking: Reported on 04/19/2021 03/12/21   Meredith Staggers, MD  arformoterol [facility]) 15 MCG/2ML NEBU Take 2 mLs (15 mcg total) by nebulization 2 (two) times daily. Patient not taking: Reported on 04/19/2021 08/30/19   Collene Gobble, MD  budesonide (PULMICORT) 0.5 MG/2ML nebulizer solution Take 2 mLs (0.5 mg total) by nebulization 2 (two) times daily. Patient not taking: Reported on 04/19/2021 08/24/19  [name], MD  diclofenac Sodium (VOLTAREN) 1 % GEL Apply 2 g topically 4 (four) times daily. Patient not taking: Reported on 04/19/2021 03/22/21   Debbe Odea, MD  nitrofurantoin, macrocrystal-monohydrate, (MACROBID) 100 MG capsule Take 1 capsule (100 mg total) by mouth 2 (two) times daily. Patient not taking: Reported on 04/19/2021 03/30/21   Dorie Rank, MD    Physical Exam: Vitals:   04/19/21 1400 04/19/21 1630 04/19/21 1645 04/19/21 1730  BP: (!) 166/40 (!) 182/83 (!) 172/80 (!) 156/77  Pulse: 83 84 86 89  Resp: _0 Temp:      TempSrc:      SpO2: 100% 98% 97% 95%   Constitutional: Chronically ill-appearing obese woman resting in bed, NAD, somewhat anxious Eyes: PERRL, lids and conjunctivae normal ENMT: Mucous membranes are dry. Posterior pharynx clear of any exudate or lesions.Normal dentition.  Neck: normal, supple, no masses. Respiratory: clear to auscultation  bilaterally, no wheezing, no crackles. Normal respiratory effort. No accessory muscle use.  Cardiovascular: Regular rate and rhythm, systolic murmur present. No extremity edema. 2+ pedal pulses. Abdomen: Mild epigastric and suprapubic tenderness, no masses palpated. No hepatosplenomegaly. Bowel sounds positive.  Musculoskeletal: no clubbing / cyanosis. No joint deformity upper and lower extremities. Good ROM, no contractures. Normal muscle tone.  Skin: no rashes, lesions, ulcers. No induration Neurologic: CN 2-12 grossly intact. Sensation intact. Strength 5/5 in all 4.  Psychiatric: Normal judgment and insight. Alert and oriented x 3.  Depressed mood.  Labs on Admission: I have personally reviewed following labs and imaging studies  CBC: Recent Labs  Lab 04/19/21 0938  WBC 9.0  HGB 11.8*  HCT 38.3  MCV 97.2  PLT 833   Basic Metabolic Panel: Recent Labs  Lab 04/19/21 0938  NA 140  K 3.4*  CL 108  CO2 20*  GLUCOSE 190*  BUN 8  CREATININE 1.33*  CALCIUM 9.1   GFR: Estimated Creatinine Clearance: 44.4 mL/min (A) (by C-G formula based on SCr of 1.33 mg/dL (H)). Liver Function Tests: Recent Labs  Lab 04/19/21 1305  AST 33  ALT 10  ALKPHOS 116  BILITOT 0.7  PROT 6.7  ALBUMIN 2.5*   Recent Labs  Lab 04/19/21 1305  LIPASE 17   No results for input(s): AMMONIA in the last 168 hours. Coagulation Profile: No results for input(s): INR, PROTIME in the last 168 hours. Cardiac Enzymes: No results for input(s): CKTOTAL, CKMB, CKMBINDEX, TROPONINI in the last 168 hours. BNP (last 3 results) No results for input(s): PROBNP in the last 8760 hours. HbA1C: No results for input(s): HGBA1C in the last 72 hours. CBG: No results for input(s): GLUCAP in the last 168 hours. Lipid Profile: No results for input(s): CHOL, HDL, LDLCALC, TRIG, CHOLHDL, LDLDIRECT in the last 72 hours. Thyroid Function Tests: No results for input(s): TSH, T4TOTAL, FREET4, T3FREE, THYROIDAB in the last  72 hours. Anemia Panel: No results for input(s): VITAMINB12, FOLATE, FERRITIN, TIBC, IRON, RETICCTPCT in the last 72 hours. Urine analysis:    Component Value Date/Time   COLORURINE AMBER (A) 03/30/2021 2225   APPEARANCEUR HAZY (A) 03/30/2021 2225   LABSPEC 1.017 03/30/2021 2225   PHURINE 5.0 03/30/2021 2225   GLUCOSEU NEGATIVE 03/30/2021 2225   GLUCOSEU 100 (A) 08/13/2020 1550   HGBUR NEGATIVE 03/30/2021 2225   BILIRUBINUR NEGATIVE 03/30/2021 2225   KETONESUR NEGATIVE 03/30/2021 2225   PROTEINUR NEGATIVE 03/30/2021 2225   UROBILINOGEN 0.2 08/13/2020 1550   NITRITE NEGATIVE 03/30/2021 2225   LEUKOCYTESUR MODERATE (  A) 03/30/2021 2225    Radiological Exams on Admission: DG Chest 2 View  Result Date: 04/19/2021 CLINICAL DATA:  Left-sided chest pain for 1 week. Shortness of breath. History of COPD. Patient fell 1 week ago. EXAM: CHEST - 2 VIEW COMPARISON:  [place] 04/12/2021.  CT 08/23/2019. FINDINGS: Stable mild cardiomegaly post median sternotomy and CABG. There are mitral annular and aortic calcifications. The lungs appear clear. There is no pleural effusion or pneumothorax. No acute fractures are seen. Several sternotomy wires are chronically fractured. IMPRESSION: Stable chest.  No evidence of acute cardiopulmonary process. Electronically Signed   By: Richardean Sale M.D.   On: 04/19/2021 10:39   CT ABDOMEN PELVIS W CONTRAST  Result Date: 04/19/2021 CLINICAL DATA:  Abdominal pain and nausea. EXAM: CT ABDOMEN AND PELVIS WITH CONTRAST TECHNIQUE: Multidetector CT imaging of the abdomen and pelvis was performed using the standard protocol following bolus administration of intravenous contrast. CONTRAST:  27m OMNIPAQUE IOHEXOL 350 MG/ML SOLN COMPARISON:  Noncontrast CT 07/28/2014 FINDINGS: Lower chest: Heterogeneous pulmonary parenchyma with areas of subpleural ground-glass opacity in the right lower lobe. Similar findings seen on 08/23/2019 chest CT. Mild cardiomegaly with coronary artery  calcifications no pleural fluid. Hepatobiliary: Calcified granuloma in the right lobe of the liver. Diffusely decreased hepatic density typical of steatosis. 19 mm cyst abutting the porta hepatis. Additional low-density abutting the falciform ligament, possible focal fatty infiltration but nonspecific. No suspicious liver lesion. Cholecystectomy without biliary dilatation. Pancreas: Fatty atrophy.  No ductal dilatation or inflammation. Spleen: Normal in size.  Occasional calcified granuloma. Adrenals/Urinary Tract: Normal adrenal glands. Marked bilateral native renal parenchymal atrophy. Calcifications at both renal hila may be nonobstructing stones or vascular. Left lower quadrant transplant kidney. No transplant hydronephrosis or perinephric edema. There are tiny cortical hypodensities in the transplant that likely represent cysts but are too small to characterize. Transplanted ureter is nondistended. Urinary bladder is partially distended. There is bladder wall thickening about the dome. Similar wall thickening was seen on 2015 exam. Stomach/Bowel: Gastric band in place. Stomach is decompressed. No small bowel obstruction or inflammation. Mild sigmoid colonic diverticulosis without diverticulitis. Small volume of colonic stool. Normal appendix. Vascular/Lymphatic: Advanced aortic and branch atherosclerosis with calcified and noncalcified atheromatous plaque. No aneurysm or acute vascular finding. Chunky plaque at the iliac 1 bifurcations likely cause some degree of luminal stenosis, not well assessed on this non CTA exam. Patent portal vein. No enlarged lymph nodes in the abdomen or pelvis. Reproductive: Atrophic uterus.  No adnexal mass. Other: No free air, free fluid, or intra-abdominal fluid collection. Musculoskeletal: There are no acute or suspicious osseous abnormalities. Degenerative change throughout the spine. Bilateral hip osteoarthritis, moderately advanced on the left. Scattered bone islands.  IMPRESSION: 1. No acute abnormality in the abdomen/pelvis. 2. Left lower quadrant transplant kidney without transplant hydronephrosis or perinephric edema. 3. Mild bladder wall thickening about the dome, similar to 2015 exam, and favored to be chronic. Recommend urinalysis to exclude urinary tract infection. 4. Hepatic steatosis. 5. Colonic diverticulosis without diverticulitis. 6. Heterogeneous pulmonary parenchyma in the lung bases suggestive of small vessel or small airways disease. Areas of subpleural ground-glass opacity in the right lower lobe are not significantly changed from January 2021 chest CT. Aortic Atherosclerosis (ICD10-I70.0). Electronically Signed   By: MKeith RakeM.D.   On: 04/19/2021 15:25    EKG: Personally reviewed. Normal sinus rhythm, incomplete RBBB, QTC 518.  Previous EKG showed LBBB.  Assessment/Plan Principal Problem:   Nausea & vomiting Active Problems:   Dyslipidemia,  goal LDL below 70   Hypertension associated with diabetes ([place])   Paroxysmal atrial fibrillation ([place]); CHA2DS2VASc score F, HTN, CAD, CVA = 5   Type 2 diabetes mellitus with peripheral neuropathy ([place])   Renal transplant, status post   Acute kidney injury ([facility])   Katherine [name] is a 65 y.o. female with medical history significant for ESRD s/p renal transplant on chronic immunosuppression, CAD s/p CABG and multiple PCI most recently DES to SVG 14 February 2021, PAF on Eliquis, chronic diastolic CHF, COPD, chronic respiratory failure with hypoxia on 3 L supplemental O2 via [place] with exertion and sleep, insulin-dependent T2DM, HTN, HLD, depression/anxiety, chronic pain who is admitted with gastroenteritis.  Nausea/vomiting/diarrhea: Suspect viral gastroenteritis.  Diarrhea appears to be resolving however if she does have continued loose stools then would consider sending for C. difficile and GI pathogen panels.  Still not able to maintain adequate oral intake. -Continue gentle IV fluid hydration  overnight -Antiemetics as needed -Advance diet as tolerated  Acute kidney injury in setting of ESRD s/p renal transplant (baseline creatinine 0.8-0.9): Likely prerenal from GI losses.  Continue IV fluid hydration overnight and repeat labs in AM. -Hold home Lasix and metformin for now -Resume home prednisone 5 mg daily and Imuran  Dysuria: Patient reporting persistent dysuria.  Check urinalysis.  If abnormal start antibiotics and add on urine culture.  Paroxysmal atrial fibrillation: In sinus rhythm with controlled rate on admission.  Continue Lopressor, Eliquis.  Tikosyn not resumed at this time as she has not taken her medications in at least 2 days and potassium is low.  CAD s/p CABG and multiple PCI: Mild troponin elevation likely demand ischemia, repeat troponin trending down. -Continue Plavix, Imdur, rosuvastatin  COPD/chronic respiratory failure with hypoxia: Chronic and stable.  Patient states she is using 3 L O2 via [place] at all times.  Continue Brovana, Pulmicort, Singulair, albuterol as needed.  Chronic diastolic CHF: EF 32-20%.  Volume depleted from GI losses.  On IV fluid hydration as above.  Holding home Lasix.  Monitor strict I/O's.  Insulin-dependent type 2 diabetes: Continue home Basaglar 15 units daily, add moderate SSI.  Hold home metformin and Amaryl.  Hypokalemia: Supplement, check magnesium.  Hypertension: Continue Imdur, Lopressor.  Hyperlipidemia: Continue rosuvastatin.  Depression/anxiety: Continue Cymbalta, Lamictal, Zoloft.  Chronic pain/fibromyalgia: Continue Cymbalta, Oxy IR with hold parameters.  Deconditioning/debility: Request PT/OT eval.  DVT prophylaxis: Eliquis Code Status: DNR, confirmed with patient on admission Family Communication: Discussed with patient, she has discussed with family Disposition Plan: From home, dispo pending clinical progress Consults called: None Level of care: Telemetry Medical Admission status:  Status is:  Observation  The patient remains OBS appropriate and will d/c before 2 midnights.  Dispo: The patient is from: Home              Anticipated d/c is to:  Home versus SNF              Patient currently is not medically stable to d/c.  Zada Finders MD [facility]  If 7PM-7AM, please contact night-coverage [email or web address]  04/19/2021, 6:45 PM

## 2021-04-19 NOTE — ED Provider Notes (Signed)
[facility] EMERGENCY DEPARTMENT Provider Note   CSN: [id number] Arrival date & time: 04/19/21  0623     History No chief complaint on file.   Katherine [name] is a 65 y.o. female.  Pt is a 65 yo female with pmh as seen below including DM, CAD, GERD, and ESRD with hx of renal transplant in 1990s presenting for nausea, vomiting, generalized abdominal pain, diarrhea, and subjective fevers. Pt denies sick contacts. Able to tolerate fluids without difficultly.   The history is provided by the patient. No language interpreter was used.  Abdominal Pain Pain location:  Generalized Pain quality: aching   Pain radiates to:  Does not radiate Pain severity:  Mild Onset quality:  Gradual Associated symptoms: diarrhea, fever (subjective), nausea and vomiting   Associated symptoms: no chest pain, no chills, no cough, no dysuria, no hematuria, no shortness of breath and no sore throat       Past Medical History:  Diagnosis Date   Anemia    Anxiety    Bilateral carotid artery stenosis    Carotid duplex 02/6282: 1-51% LICA, 76-16% RICA, >07% RECA, f/u 1 yr suggested   CAD (coronary artery disease) of bypass graft 5/01; 3/'02, 8/'03, 10/'04; 1/15   PCI x 5 to SVG-D1    CAD in native artery 07/1993   3 Vessel Disease (LAD-D1 & RCA) -- CABG (Dx in setting of inferior STEMI-PTCA of RCA)   CAD S/P percutaneous coronary angioplasty    PCI to SVG-D1 insertion/native D1 x 4 = '01 -(S660 BMS 2.5 x 9 anastomosis- D1); '02 - distal overlap ACS Pixel 2.5 x 8  BMS; '03 distal/native ISR/Thrombosis - Pixel 2.5 x 13; '04 - ISR-  Taxus 2.5 x 20 (covered all);; 1/15 - mid SVG-D1 (50% distal ISR) - Promus P 2.75 x 20 -- 2.8 mm   COPD mixed type (HCC)    Followed by Dr. Lamonte Sakai "pulmonologist said no COPD"   Depression    Depression with anxiety    Diabetes mellitus type 2 in obese ([place])    Diarrhea    started after cholecystectomy and mass removed from intestine   Dyslipidemia, goal LDL  below 70    08/2012: TC 137, TG 200, HDL 32!, LDL 45; on statin (followed by Dr.Deterding)   ESRD (end stage renal disease) ([place]) 1991   s/p Cadaveric Renal Transplant [facility] - Dr. Jimmy Footman)    Family history of adverse reaction to anesthesia    mom's bp dropped during/after anesthesia   Fibromyalgia    GERD (gastroesophageal reflux disease)    Glomerulonephritis, chronic, rapidly progressive 1989   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 07/1993   Rescue PTCA of RCA -- referred for CABG.   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 Servando Snare: LIMA-LAD, SVG-bifurcatingD1, SVG-rPDA   History of kidney stones    History of stroke 2012   "right eye stroke- half blind now"   Hypertension associated with diabetes ([place])    Mild aortic stenosis by prior echocardiogram 07/2019   Echo:  Mild aortic stenosis (gradients: Mean 14.3 mmHg -peak 24.9 mmHg).   Morbid obesity (HCC)    MRSA (methicillin resistant staph aureus) culture positive    OSA (obstructive sleep apnea)    no longer on CPAP or home O2, states she doesn't need now after lap band   PAD (peripheral artery disease) ([place])  08/2013   LEA Dopplers to be read by Dr. Fletcher Anon   PAF (paroxysmal atrial fibrillation) ([place]) 06/2014   Noted on CardioNet Monitor  - --> rhythm control with Tikosyn (Dr. Rayann Heman); converted from warfarin to apixaban for anticoagulation.   Pneumonia    Recurrent boils    Bilateral Groin   Rheumatoid arthritis ([place])    Per Patient Report; associated with OA   S/p cadaver renal transplant 1991   [facility]   Unstable angina ([place]) 5/01; 3/'02, 8/'03, 10/'04; 1/15   x 5 occurences since Inf-Post STEMI in 1994    Patient Active Problem List   Diagnosis Date Noted   Viral syndrome 03/21/2021   Pure hypercholesterolemia    Coronary stent restenosis due to scar tissue    Unstable angina ([place]) 02/19/2021   Acute on chronic diastolic CHF (congestive  heart failure) ([place]) 01/29/2021   Acute CHF (congestive heart failure) ([place]) 01/29/2021   Acute on chronic systolic CHF (congestive heart failure) ([place]) 01/29/2021   Non-ST elevation (NSTEMI) myocardial infarction [facility])    Chronic respiratory failure with hypoxia ([place]) 01/01/2021   Flu vaccine need 08/13/2020   Need for pneumococcal vaccination 08/13/2020   Visit for screening mammogram 08/13/2020   Candidal skin infection 08/13/2020   GAD (generalized anxiety disorder) 08/13/2020   Chronic bronchitis, mucopurulent ([place]) 08/24/2019   Current chronic use of systemic steroids 07/23/2019   OSA (obstructive sleep apnea)    Severe episode of recurrent major depressive disorder, with psychotic features ([place]) 02/16/2019   Obstructive chronic bronchitis without exacerbation ([place]) 11/03/2018   Long term (current) use of anticoagulants 04/28/2018   Renal transplant, status post 01/28/2018   LBBB (left bundle branch block) 10/07/2017   Primary osteoarthritis of right knee 08/18/2017   Shortness of breath 06/18/2017   Mild aortic stenosis by prior echocardiogram 01/28/2017   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   Deficiency anemia 10/14/2016   Normocytic anemia 10/13/2016   Fibromyalgia 03/30/2016   Other spondylosis with radiculopathy, lumbar region 03/30/2016   Type 2 diabetes mellitus with peripheral neuropathy ([place]) 03/30/2016   Paroxysmal atrial fibrillation ([place]); CHA2DS2VASc score F, HTN, CAD, CVA = 5 06/28/2014   Hypertension associated with diabetes ([place])    Chest pain 08/23/2013   PAD (peripheral artery disease) ([place]) 08/17/2013   Stenosis of right carotid artery without infarction 10/08/2012   Dyslipidemia, goal LDL below 70 10/08/2012    Class: Diagnosis of   Mitral annular calcification 10/08/2012   Hypomagnesemia 06/13/2012   Renal transplant disorder 06/12/2012   Chronic allergic rhinitis 04/29/2011   Extrinsic asthma 09/09/2007   GERD 09/09/2007   COUGH, CHRONIC 09/09/2007    Morbid obesity - s/p Lap Band 9/'05 05/07/2004    Class: Diagnosis of   Hx of CABG 09/07/1993    Class: History of   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 07/1993   CAD in native artery 07/1993   ESRD (end stage renal disease) ([place]) 1991    Past Surgical History:  Procedure Laterality Date   ABDOMINAL AORTOGRAM N/A 04/21/2018   Procedure: ABDOMINAL AORTOGRAM;  Surgeon: [name], MD;  Location: [place] CV LAB;  Service: Cardiovascular;  Laterality: N/A;   CATHETER REMOVAL     CHOLECYSTECTOMY N/A 10/29/2014   Procedure: LAPAROSCOPIC CHOLECYSTECTOMY WITH INTRAOPERATIVE CHOLANGIOGRAM;  Surgeon: Excell Seltzer, MD;  Location: [facility];  Service: General;  Laterality: N/A;   CORONARY ANGIOPLASTY  1994   x5   CORONARY ARTERY BYPASS GRAFT  1995   LIMA-LAD, SVG-RPDA,  SVG-D1   CORONARY STENT INTERVENTION N/A 02/20/2021   Procedure: CORONARY STENT INTERVENTION;  Surgeon: [name], MD;  Location: [place] [facility] LAB;  Service: Cardiovascular;  Laterality: N/A;   ESOPHAGOGASTRODUODENOSCOPY N/A 10/15/2016   Procedure: ESOPHAGOGASTRODUODENOSCOPY (EGD);  Surgeon: Wilford Corner, MD;  Location: [facility] ENDOSCOPY;  Service: Endoscopy;  Laterality: N/A;   I & D EXTREMITY Right 01/29/2018   Procedure: IRRIGATION AND DEBRIDEMENT THUMB;  Surgeon: Dayna Barker, MD;  Location: [place];  Service: Plastics;  Laterality: Right;   INCISE AND DRAIN ABCESS     INTRAVASCULAR PRESSURE WIRE/FFR STUDY N/A 02/20/2021   Procedure: INTRAVASCULAR PRESSURE WIRE/FFR STUDY;  Surgeon: [name], MD;  Location: [place] [facility] LAB;  Service: Cardiovascular;  Laterality: N/A;   KIDNEY TRANSPLANT  1991   KNEE ARTHROSCOPY WITH LATERAL MENISECTOMY Left 12/03/2017   Procedure: LEFT KNEE ARTHROSCOPY WITH LATERAL MENISECTOMY;  Surgeon: Earlie Server, MD;  Location: [place];  Service: Orthopedics;  Laterality: Left;   LAPAROSCOPIC GASTRIC BANDING  04/2004; 10/'09, 2/'10   Port Replacement x  2   LEFT HEART CATH AND CORONARY ANGIOGRAPHY N/A 02/20/2021   Procedure: LEFT HEART CATH AND CORONARY ANGIOGRAPHY;  Surgeon: [name], MD;  Location: [place] [facility] LAB;  Service: Cardiovascular;  Laterality: N/A;   LEFT HEART CATH AND CORS/GRAFTS ANGIOGRAPHY N/A 04/21/2018   Procedure: LEFT HEART CATH AND CORS/GRAFTS ANGIOGRAPHY;  Surgeon: [name], MD;  Location: [place] [facility];  Ost-Prox LAD 50% - proxLAD (pre & post D1) 100% CTO. Cx - patent, small OM1 (stable ~ ostial OM1 90%, too small for PCI) & 2 small LPL; Ost-distal RCA 100% CTO.  LIMA-LAD (not injected); SVG-dRCA patent, SVG-D1 - insertion stent ~20% ISR - Severe R CFA disease w/ focal Sub TO   LEFT HEART CATH AND CORS/GRAFTS ANGIOGRAPHY  5/'01, 3/'02, 8/'03, 10/'04; 1/'15   08/22/2013: LAD & RCA 100%; LIMA-LAD & SVG-rPDA patent; Cx-- OM1 60%, OM2 ostial ~50%; SVG-D1 - 80% mid, 50% distal ISR --PCI   LEFT HEART CATH AND CORS/GRAFTS ANGIOGRAPHY N/A 01/31/2021   Procedure: LEFT HEART CATH AND CORS/GRAFTS ANGIOGRAPHY;  Surgeon: Jolaine Artist, MD;  Location: [place] CV LAB;  Service: Cardiovascular;  Laterality: N/A;   LEFT HEART CATHETERIZATION WITH CORONARY/GRAFT ANGIOGRAM N/A 08/23/2013   Procedure: LEFT HEART CATHETERIZATION WITH Beatrix Fetters;  Surgeon: Wellington Hampshire, MD;  Location: [place] CATH LAB;  Service: Cardiovascular;  Laterality: N/A;   Lower Extremity Arterial Dopplers  08/2013   ABI: R 0.96, L 1.04   MULTIPLE TOOTH EXTRACTIONS  age 50   NM MYOVIEW LTD  03/2016   EF 62%. LOW RISK. C/W prior MI - no Ischemia. Apical hypokinesis.   PERCUTANEOUS CORONARY STENT INTERVENTION (PCI-S)  5/'01, 3/'02, 8/'03, 10/'04;   '01 - S660 BMS 2.5 x 9 - dSVG-D1 into D1; '02- post-stent stenosis - 2.5 x 8 Pixel BMS; '8\03: ISR/Thrombosis into native D1 - AngioJet, 2.5 x 13 Pixel; '04 - ISR 95% - covered stented area with Taxus DES 2.5 mm x 20 (2.88)   PERCUTANEOUS CORONARY STENT INTERVENTION (PCI-S)  08/23/2013    Procedure: PERCUTANEOUS CORONARY STENT INTERVENTION (PCI-S);  Surgeon: Wellington Hampshire, MD;  Location: [facility] CATH LAB;  Service: Cardiovascular;;mid SVG-D1 80%; distal stent ~50% ISR; Promus Prermier DES 2.75 mm xc 20 mm (2.8 mm)   PORT-A-CATH REMOVAL     kidney   TRANSTHORACIC ECHOCARDIOGRAM  07/2019   EF 55 to 60%.  No LVH.  Paradoxical septal wall motion-post  CABG.  GRII DD.  Normal RV size and function.  Mild bilateral atrial dilation.  Moderate MAC.  Trace MR.  Mild aortic stenosis (gradients: Mean 14.3 mmHg -peak 24.9 mmHg).   TUBAL LIGATION     wrist fistula repair Left    dialysis for one year     OB History   No obstetric history on file.     Family History  Problem Relation Age of Onset   Cancer Mother        liver   Heart disease Father    Cancer Father        colon   Arrhythmia Brother        Atrial Fibrillation   Arrhythmia Paternal Aunt        Atrial Fibrillation    Social History   Tobacco Use   Smoking status: Every Day    Packs/day: 1.00    Years: 30.00    Pack years: 30.00    Types: Cigarettes    Last attempt to quit: 08/17/2002    Years since quitting: 18.6   Smokeless tobacco: Never  Vaping Use   Vaping Use: Never used  Substance Use Topics   Alcohol use: No   Drug use: No    Home Medications Prior to Admission medications   Medication Sig Start Date End Date Taking? Authorizing Provider  acetaminophen (TYLENOL) 500 MG tablet Take 1,500 mg by mouth 2 (two) times daily.    [provider]  albuterol (VENTOLIN HFA) 108 (90 Base) MCG/ACT inhaler TAKE 2 PUFFS BY MOUTH EVERY 6 HOURS AS NEEDED FOR WHEEZE OR SHORTNESS OF BREATH Patient taking differently: Inhale 2 puffs into the lungs every 6 (six) hours as needed for wheezing or shortness of breath. 02/10/21   Byrum, Rose Fillers, MD  amoxicillin (AMOXIL) 250 MG capsule Take 1 capsule (250 mg total) by mouth 3 (three) times daily. 03/12/21   Meredith Staggers, MD  apixaban (ELIQUIS) 5 MG TABS tablet  Take 1 tablet (5 mg total) by mouth 2 (two) times daily. APPOINTMENT NEEDED WITH CARDIOLOGIST FOR FURTHER REFILLS 04/17/21   Sherran Needs, NP  arformoterol (BROVANA) 15 MCG/2ML NEBU Take 2 mLs (15 mcg total) by nebulization 2 (two) times daily. 08/30/19   Collene Gobble, MD  azaTHIOprine (IMURAN) 50 MG tablet Take 125 mg by mouth See admin instructions. Take 2 1/2 tablets (125 mg) by mouth daily at 3pm    [provider]  B-D ULTRAFINE III SHORT PEN 31G X 8 MM MISC See admin instructions. 11/06/19   [provider]  Blood Glucose Monitoring Suppl (ONE TOUCH ULTRA 2) w/Device KIT 2 (two) times daily. 04/08/20   [provider]  budesonide (PULMICORT) 0.5 MG/2ML nebulizer solution Take 2 mLs (0.5 mg total) by nebulization 2 (two) times daily. 08/24/19   [name], MD  calcitRIOL (ROCALTROL) 0.25 MCG capsule Take 0.25 mcg by mouth every 3 (three) days.     [provider]  cetirizine (ZYRTEC) 10 MG tablet Take 10 mg by mouth daily.    [provider]  clopidogrel (PLAVIX) 75 MG tablet Take 1 tablet (75 mg total) by mouth daily. 02/22/21   Cheryln Manly, NP  clotrimazole (MYCELEX) 10 MG troche Take 1 tablet (10 mg total) by mouth 5 (five) times daily as needed (thrush). 02/03/21   Danford, Suann Larry, MD  cyclobenzaprine (FLEXERIL) 10 MG tablet TAKE 2 TAB DAILY AT BEDTIME, MAY ALSO TAKE 1 TABLET BY MOUTH AT NOON AS  NEEDED FOR MUSCLE SPASMS 04/18/21   Meredith Staggers, MD  diclofenac Sodium (VOLTAREN) 1 % GEL Apply 2 g topically 4 (four) times daily. 03/22/21   Debbe Odea, MD  diltiazem (CARDIZEM) 30 MG tablet take 1 tablet every 4 hours AS NEEDED for afib heart rate >100 as long as top blood pressure >100. Patient taking differently: Take 30 mg by mouth See admin instructions. take 1 tablet (30 mg) every 4 hours AS NEEDED for afib heart rate >100 as long as top blood pressure >100. 03/04/21   Sherran Needs, NP  dofetilide (TIKOSYN) 250 MCG capsule  Take 1 capsule (250 mcg total) by mouth 2 (two) times daily. 03/04/21   Sherran Needs, NP  DULoxetine (CYMBALTA) 30 MG capsule TAKE 2 CAPSULES (60 MG TOTAL) BY MOUTH EVERY MORNING. Patient taking differently: Take 60 mg by mouth every morning. 10/30/20   Bayard Hugger, NP  ferrous sulfate 325 (65 FE) MG tablet Take 325 mg by mouth daily with breakfast.    [provider]  fluticasone (FLONASE) 50 MCG/ACT nasal spray PLACE 2 SPRAYS INTO BOTH NOSTRILS 2 TIMES DAILY Patient taking differently: Place 2 sprays into both nostrils 2 (two) times daily. 12/17/20   Collene Gobble, MD  furosemide (LASIX) 40 MG tablet Take 1 tablet (40 mg total) by mouth daily. 07/14/20   Dwyane Dee, MD  glimepiride (AMARYL) 4 MG tablet Take 4 mg by mouth daily. 02/03/20   [provider]  insulin aspart (NOVOLOG FLEXPEN) 100 UNIT/ML FlexPen Inject 14 Units into the skin 3 (three) times daily after meals.    [provider]  Insulin Glargine (BASAGLAR KWIKPEN) 100 UNIT/ML SOPN Inject 12 Units into the skin daily before breakfast.    [provider]  isosorbide mononitrate (IMDUR) 60 MG 24 hr tablet Take 1 tablet (60 mg total) by mouth daily. 03/22/21   Debbe Odea, MD  lamoTRIgine (LAMICTAL) 25 MG tablet TAKE 1 TABLET BY MOUTH EVERYDAY AT BEDTIME Patient taking differently: Take 25 mg by mouth at bedtime. 03/05/21   Meredith Staggers, MD  Lancets [facility] DELICA PLUS [id number]) MISC daily. as directed 03/26/20   [provider]  LORazepam (ATIVAN) 1 MG tablet Take 1 tablet (1 mg total) by mouth 2 (two) times daily. 03/20/21   [name], MD  metFORMIN (GLUCOPHAGE) 500 MG tablet Take 1 tablet (500 mg total) by mouth 2 (two) times daily. 02/22/21   Burnell Blanks, MD  metoprolol tartrate (LOPRESSOR) 100 MG tablet Take 1 tablet (100 mg total) by mouth 2 (two) times daily. Appointment Required For Further Refills [phone] 03/22/21   Debbe Odea, MD  montelukast  (SINGULAIR) 10 MG tablet TAKE 1 TABLET BY MOUTH EVERYDAY AT BEDTIME Patient taking differently: Take 10 mg by mouth at bedtime. 02/10/21   Collene Gobble, MD  nitrofurantoin, macrocrystal-monohydrate, (MACROBID) 100 MG capsule Take 1 capsule (100 mg total) by mouth 2 (two) times daily. 03/30/21   Dorie Rank, MD  nitroGLYCERIN (NITROSTAT) 0.4 MG SL tablet Place 1 tablet (0.4 mg total) under the tongue every 5 (five) minutes as needed for chest pain. 02/21/21   Burnell Blanks, MD  ONETOUCH ULTRA test strip USE AS DIRECTED 3 TIMES DAILY TO CHECK BLOOD SUGAR 11/04/19   [provider]  Oxycodone HCl 10 MG TABS Take 1 tablet (10 mg total) by mouth 4 (four) times daily as needed. 03/28/21   Meredith Staggers, MD  OXYGEN Inhale 3 L into the lungs  continuous.    [provider]  pantoprazole (PROTONIX) 40 MG tablet Take 1 tablet (40 mg total) by mouth daily. 02/21/21 05/22/21  Cheryln Manly, NP  predniSONE (DELTASONE) 5 MG tablet Take 5 mg by mouth every morning. 06/19/19   [provider]  promethazine (PHENERGAN) 25 MG tablet Take 1 tablet (25 mg total) by mouth at bedtime as needed for nausea. For nausea and sleep 07/14/20   Dwyane Dee, MD  rosuvastatin (CRESTOR) 40 MG tablet Take 1 tablet (40 mg total) by mouth daily. 02/03/21   Danford, Suann Larry, MD  sertraline (ZOLOFT) 100 MG tablet Take 100 mg by mouth every morning. 06/12/20   [provider]    Allergies    Tetracycline, Niacin, Niaspan [niacin er], Sulfa antibiotics, Sulfonamide derivatives, Codeine, Erythromycin, Hydromorphone hcl, Morphine and related, Nalbuphine, Sulfasalazine, and Tape  Review of Systems   Review of Systems  Constitutional:  Positive for fever (subjective). Negative for chills.  HENT:  Negative for ear pain and sore throat.   Eyes:  Negative for pain and visual disturbance.  Respiratory:  Negative for cough and shortness of breath.   Cardiovascular:  Negative for chest  pain and palpitations.  Gastrointestinal:  Positive for abdominal pain, diarrhea, nausea and vomiting.  Genitourinary:  Negative for dysuria and hematuria.  Musculoskeletal:  Negative for arthralgias and back pain.  Skin:  Negative for color change and rash.  Neurological:  Negative for seizures and syncope.  All other systems reviewed and are negative.  Physical Exam Updated Vital Signs BP 131/67 (BP Location: Right Arm)   Pulse 66   Temp 98.5 F (36.9 C) (Oral)   Resp 18   SpO2 100%   Physical Exam Vitals and nursing note reviewed.  Constitutional:      General: She is not in acute distress.    Appearance: She is well-developed. She is morbidly obese.  HENT:     Head: Normocephalic and atraumatic.  Eyes:     Conjunctiva/sclera: Conjunctivae normal.  Cardiovascular:     Rate and Rhythm: Normal rate and regular rhythm.     Heart sounds: No murmur heard. Pulmonary:     Effort: Pulmonary effort is normal. No respiratory distress.     Breath sounds: Normal breath sounds.  Abdominal:     Palpations: Abdomen is soft.     Tenderness: There is generalized abdominal tenderness. There is no guarding or rebound.  Musculoskeletal:     Cervical back: Neck supple.  Skin:    General: Skin is warm and dry.  Neurological:     Mental Status: She is alert and oriented to person, place, and time.     GCS: GCS eye subscore is 4. GCS verbal subscore is 5. GCS motor subscore is 6.  Psychiatric:        Mood and Affect: Affect is tearful.    ED Results / Procedures / Treatments   Labs (all labs ordered are listed, but only abnormal results are displayed) Labs Reviewed  BASIC METABOLIC PANEL - Abnormal; Notable for the following components:      Result Value   Potassium 3.4 (*)    CO2 20 (*)    Glucose, Bld 190 (*)    Creatinine, Ser 1.33 (*)    GFR, Estimated 45 (*)    All other components within normal limits  CBC - Abnormal; Notable for the following components:   Hemoglobin 11.8  (*)    All other components within normal limits  TROPONIN I (  HIGH SENSITIVITY) - Abnormal; Notable for the following components:   Troponin I (High Sensitivity) 60 (*)    All other components within normal limits  RESP PANEL BY RT-PCR (FLU A&B, COVID) [id number]  HEPATIC FUNCTION PANEL  LIPASE, BLOOD  TROPONIN I (HIGH SENSITIVITY)    EKG None  Radiology DG Chest 2 View  Result Date: 04/19/2021 CLINICAL DATA:  Left-sided chest pain for 1 week. Shortness of breath. History of COPD. Patient fell 1 week ago. EXAM: CHEST - 2 VIEW COMPARISON:  [place] 04/12/2021.  CT 08/23/2019. FINDINGS: Stable mild cardiomegaly post median sternotomy and CABG. There are mitral annular and aortic calcifications. The lungs appear clear. There is no pleural effusion or pneumothorax. No acute fractures are seen. Several sternotomy wires are chronically fractured. IMPRESSION: Stable chest.  No evidence of acute cardiopulmonary process. Electronically Signed   By: Richardean Sale M.D.   On: 04/19/2021 10:39    Procedures Procedures   Medications Ordered in ED Medications  sodium chloride 0.9 % bolus 1,000 mL (has no administration in time range)    ED Course  I have reviewed the triage vital signs and the nursing notes.  Pertinent labs & imaging results that were available during my care of the patient were reviewed by me and considered in my medical decision making (see chart for details).    MDM Rules/Calculators/A&P                         1:08 PM  65 yo female with pmh as seen below including DM, CAD, GERD, and ESRD with hx of renal transplant in 1990s presenting for nausea, vomiting, generalized abdominal pain, diarrhea, and subjective fevers.   EKG stable with no ST segment elevation or depression. Troponin elevated at 60, baseline around 30. Repeat troponin pending. Stable bnp, electrolytes and CXR.   Patient has AKI worse than baseline with hx of renal transplant likely secondary to dehydration from  n/v/diarrhea. IV fluids given. CT abdomen demonstrates no acute process.    Pt signed out to oncoming provider while awaiting second troponin.   Final Clinical Impression(s) / ED Diagnoses Final diagnoses:  Renal transplant recipient  AKI (acute kidney injury) ([place])  Dehydration  Diarrhea, unspecified type  Nausea and vomiting, intractability of vomiting not specified, unspecified vomiting type  Elevated troponin  SOB (shortness of breath)    Rx / DC Orders ED Discharge Orders     None        Lianne Cure, DO 91/79/15 1537

## 2021-04-19 NOTE — ED Provider Notes (Signed)
Emergency Medicine Provider Triage Evaluation Note  Caryle Helgeson , a 65 y.o. female  was evaluated in triage.  Pt complains of left sided chest pain under her left breast for 1 week after she fell. Was seen for same and discharged. Of note, patient supposed to be on 3L O2 at baseline, but presented without it. Also has been generally weak x 1 week, had trouble taking her meds and has been nauseous.   Review of Systems  Positive: Chest pain, shortness of breath, nausea Negative:   Physical Exam  BP 131/67 (BP Location: Right Arm)   Pulse 66   Temp 98.5 F (36.9 C) (Oral)   Resp 18   SpO2 100%  Gen:   Awake, no distress  Resp:  Normal effort on 3L nasal cannula MSK:   Moves extremities without difficulty Other:  Tenderness to palpation noted to left breast area. Heart normal rate, no murmurs  Medical Decision Making  Medically screening exam initiated at 10:06 AM.  Appropriate orders placed.  [name] was informed that the remainder of the evaluation will be completed by another provider, this initial triage assessment does not replace that evaluation, and the importance of remaining in the ED until their evaluation is complete.    Nestor Lewandowsky 04/19/21 [id number]    Luna Fuse, MD 04/21/21 [phone]

## 2021-04-19 NOTE — ED Notes (Signed)
Pt transported to CT ?

## 2021-04-19 NOTE — ED Provider Notes (Signed)
  Physical Exam  BP (!) 166/40   Pulse 83   Temp 98.5 F (36.9 C) (Oral)   Resp 16   SpO2 100%   Physical Exam Vitals and nursing note reviewed.  Constitutional:      General: She is not in acute distress.    Appearance: She is well-developed. She is obese. She is ill-appearing.  HENT:     Head: Normocephalic and atraumatic.  Eyes:     Conjunctiva/sclera: Conjunctivae normal.  Cardiovascular:     Rate and Rhythm: Normal rate and regular rhythm.     Heart sounds: No murmur heard. Pulmonary:     Effort: Pulmonary effort is normal. No respiratory distress.     Breath sounds: Normal breath sounds.  Abdominal:     Palpations: Abdomen is soft.     Tenderness: There is abdominal tenderness.  Musculoskeletal:     Cervical back: Neck supple.  Skin:    General: Skin is warm and dry.  Neurological:     Mental Status: She is alert.  Psychiatric:        Mood and Affect: Mood is anxious. Affect is tearful.    ED Course/Procedures     Procedures  MDM    65 year old female with hx of renal transplant presenting with L sided chest pain for the past week. Has a mild AKI and slight elevation in troponins favored to be demand. Low concern for ACS.    EKG reviewed without acute ST segment changes.  Troponin elevated at 68, baseline around 30, repeat troponin stable.  Stable BNP and chest x-ray.  CT abdomen pelvis negative for acute abnormalities.  The patient does have a history of renal transplant and has an AKI with a creatinine elevated from baseline in the setting of nausea, vomiting, diarrhea.  Patient overall ill-appearing, requesting hospital admission due to inability to care for herself in the home in the setting of acute illness.  Status post IV fluids and antiemetics, patient still feels "miserable."  States that she is unable to tolerate oral intake.  In the setting of renal transplant and AKI with nausea, vomiting and diarrhea consistent with likely gastroenteritis and an  inability to tolerate her oral medications, hospitalist medicine was consulted for admission.    Regan Lemming, MD 04/20/21 1313

## 2021-04-19 NOTE — ED Triage Notes (Signed)
Pt to triage via [facility] from home.  Reports L sided chest pain under L breast x 1 week.  Also reports SOB/ COPD- not wearing her oxygen on EMS arrival.  Reports nausea and dry heaves.  Fell 1 week ago.

## 2021-04-20 DIAGNOSIS — R112 Nausea with vomiting, unspecified: Secondary | ICD-10-CM | POA: Diagnosis not present

## 2021-04-20 DIAGNOSIS — I152 Hypertension secondary to endocrine disorders: Secondary | ICD-10-CM

## 2021-04-20 DIAGNOSIS — I480 Paroxysmal atrial fibrillation: Secondary | ICD-10-CM | POA: Diagnosis not present

## 2021-04-20 DIAGNOSIS — E1159 Type 2 diabetes mellitus with other circulatory complications: Secondary | ICD-10-CM | POA: Diagnosis not present

## 2021-04-20 DIAGNOSIS — N179 Acute kidney failure, unspecified: Secondary | ICD-10-CM | POA: Diagnosis not present

## 2021-04-20 DIAGNOSIS — E785 Hyperlipidemia, unspecified: Secondary | ICD-10-CM

## 2021-04-20 LAB — BASIC METABOLIC PANEL
Anion gap: 10 (ref 5–15)
BUN: 7 mg/dL — ABNORMAL LOW (ref 8–23)
CO2: 18 mmol/L — ABNORMAL LOW (ref 22–32)
Calcium: 8.4 mg/dL — ABNORMAL LOW (ref 8.9–10.3)
Chloride: 109 mmol/L (ref 98–111)
Creatinine, Ser: 1.13 mg/dL — ABNORMAL HIGH (ref 0.44–1.00)
GFR, Estimated: 55 mL/min — ABNORMAL LOW (ref >60)
Glucose, Bld: 70 mg/dL (ref 70–99)
Potassium: 3.8 mmol/L (ref 3.5–5.1)
Sodium: 137 mmol/L (ref 135–145)

## 2021-04-20 LAB — GLUCOSE, CAPILLARY
Glucose-Capillary: 76 mg/dL (ref 70–99)
Glucose-Capillary: 64 mg/dL — ABNORMAL LOW (ref 70–99)
Glucose-Capillary: 86 mg/dL (ref 70–99)
Glucose-Capillary: 159 mg/dL — ABNORMAL HIGH (ref 70–99)
Glucose-Capillary: 207 mg/dL — ABNORMAL HIGH (ref 70–99)
Glucose-Capillary: 184 mg/dL — ABNORMAL HIGH (ref 70–99)

## 2021-04-20 LAB — URINALYSIS, ROUTINE W REFLEX MICROSCOPIC
Bilirubin Urine: NEGATIVE
Glucose, UA: NEGATIVE mg/dL
Hgb urine dipstick: NEGATIVE
Ketones, ur: NEGATIVE mg/dL
Nitrite: POSITIVE — AB
Protein, ur: 100 mg/dL — AB
Specific Gravity, Urine: 1.018 (ref 1.005–1.030)
WBC, UA: >50 WBC/hpf — ABNORMAL HIGH (ref 0–5)
pH: 5 (ref 5.0–8.0)

## 2021-04-20 LAB — CBC
HCT: 34.9 % — ABNORMAL LOW (ref 36.0–46.0)
Hemoglobin: 10.4 g/dL — ABNORMAL LOW (ref 12.0–15.0)
MCH: 30.4 pg (ref 26.0–34.0)
MCHC: 29.8 g/dL — ABNORMAL LOW (ref 30.0–36.0)
MCV: 102 fL — ABNORMAL HIGH (ref 80.0–100.0)
Platelets: 274 10*3/uL (ref 150–400)
RBC: 3.42 MIL/uL — ABNORMAL LOW (ref 3.87–5.11)
RDW: 15.9 % — ABNORMAL HIGH (ref 11.5–15.5)
WBC: 5.5 10*3/uL (ref 4.0–10.5)
nRBC: 0 % (ref 0.0–0.2)

## 2021-04-20 LAB — MAGNESIUM: Magnesium: 1.5 mg/dL — ABNORMAL LOW (ref 1.7–2.4)

## 2021-04-20 MED ORDER — MAGNESIUM OXIDE -MG SUPPLEMENT 400 (240 MG) MG PO TABS
800.0000 mg | ORAL_TABLET | Freq: Every day | ORAL | Status: DC
  Administered 2021-04-20 – 2021-04-22 (×3): 800 mg via ORAL
  Filled 2021-04-20 (×3): qty 2

## 2021-04-20 NOTE — Progress Notes (Addendum)
PROGRESS NOTE  Katherine [name] WNU:[id number] DOB: Jun 16, 1956 DOA: 04/19/2021 PCP: [name], MD  HPI/Recap of past 24 hours:  This is a 65 year old female with medical history significant for end-stage renal disease status post renal transplant on chronic immunosuppression, coronary disease status post CABG and multiple PCI most recently DES to SVG on February 14, 2021, paroxysmal atrial fibrillation on Eliquis, chronic diastolic CHF, COPD, chronic respiratory failure with hypoxia on 3 L/min supplemental oxygen via nasal cannula, insulin-dependent diabetes mellitus, hypertension, hyperlipidemia, depression, anxiety, chronic pain, who presented to the emergency department on April 19, 2021 for evaluation of nausea and vomiting abdominal pain and diarrhea.  Subjective: April 20, 2021. Patient seen and examined at bedside, nurse reported patient is having hypoglycemia.  16 She was given a soda protocol when he came up to 57 Patient stated that she has been having frequent falls at home and and she stated that she lives alone but that that has changed I will change because he will be to be going to live with her brother.  She also stated the only problem or thing that is making her sad is that she will not be allowed to bring her little dog who she has had for a long time and always like a baby to her.  Later on PT informed me that patient had just got off the phone with her brother and was quite upset because he will not let her come to stay with him and she was like she was going to kill herself and she would die before she goes to the nursing home and that she was not going to kill herself here or tried to kill herself in the hospital but she would do that when she gets home.  Assessment/Plan: Principal Problem:   Nausea & vomiting Active Problems:   Dyslipidemia, goal LDL below 70   Hypertension associated with diabetes ([place])   Paroxysmal atrial fibrillation ([place]); CHA2DS2VASc score  F, HTN, CAD, CVA = 5   Type 2 diabetes mellitus with peripheral neuropathy ([place])   Renal transplant, status post   Acute kidney injury ([place]) #1 intractable nausea vomiting Likely due to viral gastroenteritis.  Patient does not have any more diarrhea today but she still has nausea and requesting for Phenergan Continue IV hydration Started her on Phenergan Advance diet as tolerated CT scan does not show any acute abnormality  2.  Acute kidney injury in the setting of end-stage renal disease status post renal transplant.  CT scan is showing renal transplant kidney at the left lower quadrant and arctic sclerosis  3.  Dysuria.  We will check her urine for urinalysis and culture  4.  Paroxysmal atrial fibrillation patient is in sinus rhythm rate controlled we will continue Lopressor Eliquis Her Tikosyn was held deletes  4.  Coronary disease status post CABG and multiple PCI Troponin is mildly elevated but likely due to demand ischemia Troponin is trending down Continue Plavix Imdur and rosuvastatin  5.  Chronic diastolic congestive heart failure EF is 50 to 60% Patient likely volume depleted from GI losses Will do gentle IV hydration Home Lasix is on hold Strict I&O  6.  Insulin-dependent diabetes mellitus type 2 continue home Basaglar 50 units daily and moderate moderate sliding scale insulin Metformin is on hold as well as Amaryl  7.  Hypokalemia we will supplement and replete And monitor  8.  Depression continue Cymbalta Lamictal and Zoloft patient is quite teary and emotional.  She actually  did mention about going to commit suicide if she does not if she is sent back home or sent to the nursing home.  She had just got off the phone with her brother who told her she was not going to come home to stay with him.   I will reevaluate tomorrow if she still complaining of suicidality we will consult psychiatry  9.  Deconditioning we will obtain PT OT evaluation  10.  Fibromyalgia  chronic pain continue Cymbalta she is on opioids  11.  Hyperlipidemia continue rosuvastatin  12.  Hypertension continue Imdur and Lopressor  13.  COPD chronic respiratory failure with hypoxia. This is stable She is chronically on 3 L/min O2 nasal cannula at home Continue Brovana Pulmicort Singulair albuterol as needed     Code Status: DNR  Severity of Illness: The appropriate patient status for this patient is INPATIENT. Inpatient status is judged to be reasonable and necessary in order to provide the required intensity of service to ensure the patient's safety. The patient's presenting symptoms, physical exam findings, and initial radiographic and laboratory data in the context of their chronic comorbidities is felt to place them at high risk for further clinical deterioration. Furthermore, it is not anticipated that the patient will be medically stable for discharge from the hospital within 2 midnights of admission. The following factors support the patient status of inpatient.   " Generalized weakness debility  * I certify that at the point of admission it is my clinical judgment that the patient will require inpatient hospital care spanning beyond 2 midnights from the point of admission due to high intensity of service, high risk for further deterioration and high frequency of surveillance required.*   Family Communication: None at bedside  Disposition Plan: To be determined Status is: Inpatient   Dispo: The patient is from: Home              Anticipated d/c is to:               Anticipated d/c date is:               Patient currently not medically stable for discharge  Consultants: None  Procedures: CT abdomen  Antimicrobials: None  DVT prophylaxis: Apixaban Plavix   Objective: Vitals:   04/20/21 0300 04/20/21 0456 04/20/21 0825 04/20/21 0829  BP:  128/65    Pulse:  88    Resp:  16    Temp:  98.5 F (36.9 C)    TempSrc:  Oral    SpO2:  96% 99% 98%   Weight: 93.8 kg       Intake/Output Summary (Last 24 hours) at 04/20/2021 0909 Last data filed at 04/20/2021 0859 Gross per 24 hour  Intake 3279.23 ml  Output 1550 ml  Net 1729.23 ml   Filed Weights   04/19/21 2300 04/20/21 0300  Weight: 93.9 kg 93.8 kg   Body mass index is 39.07 kg/m.  Exam:  General: 65 y.o. year-old female well developed well nourished in no acute distress.  Alert and oriented x3. Cardiovascular: Regular rate and rhythm with no rubs or gallops.  No thyromegaly or JVD noted.   Respiratory: Clear to auscultation with no wheezes or rales. Good inspiratory effort. Abdomen: Soft mild tender nondistended with normal bowel sounds x4 quadrants. Musculoskeletal: No lower extremity edema. 2/4 pulses in all 4 extremities. Skin: No ulcerative lesions noted or rashes, Psychiatry: Mood is appropriate for condition and setting patient is teary crying intermittently neurology:  Data Reviewed: CBC: Recent Labs  Lab 04/19/21 0938 04/20/21 0648  WBC 9.0 5.5  HGB 11.8* 10.4*  HCT 38.3 34.9*  MCV 97.2 102.0*  PLT 363 250   Basic Metabolic Panel: Recent Labs  Lab 04/19/21 0938 04/19/21 1305 04/20/21 0648  NA 140  --  137  K 3.4*  --  3.8  CL 108  --  109  CO2 20*  --  18*  GLUCOSE 190*  --  70  BUN 8  --  7*  CREATININE 1.33*  --  1.13*  CALCIUM 9.1  --  8.4*  MG  --  1.6* 1.5*   GFR: Estimated Creatinine Clearance: 53.3 mL/min (A) (by C-G formula based on SCr of 1.13 mg/dL (H)). Liver Function Tests: Recent Labs  Lab 04/19/21 1305  AST 33  ALT 10  ALKPHOS 116  BILITOT 0.7  PROT 6.7  ALBUMIN 2.5*   Recent Labs  Lab 04/19/21 1305  LIPASE 17   No results for input(s): AMMONIA in the last 168 hours. Coagulation Profile: No results for input(s): INR, PROTIME in the last 168 hours. Cardiac Enzymes: No results for input(s): CKTOTAL, CKMB, CKMBINDEX, TROPONINI in the last 168 hours. BNP (last 3 results) No results for input(s): PROBNP in the  last 8760 hours. HbA1C: No results for input(s): HGBA1C in the last 72 hours. CBG: Recent Labs  Lab 04/19/21 2339 04/20/21 0423 04/20/21 0747 04/20/21 0809  GLUCAP 100* 76 64* 86   Lipid Profile: No results for input(s): CHOL, HDL, LDLCALC, TRIG, CHOLHDL, LDLDIRECT in the last 72 hours. Thyroid Function Tests: No results for input(s): TSH, T4TOTAL, FREET4, T3FREE, THYROIDAB in the last 72 hours. Anemia Panel: No results for input(s): VITAMINB12, FOLATE, FERRITIN, TIBC, IRON, RETICCTPCT in the last 72 hours. Urine analysis:    Component Value Date/Time   COLORURINE AMBER (A) 03/30/2021 2225   APPEARANCEUR HAZY (A) 03/30/2021 2225   LABSPEC 1.017 03/30/2021 2225   PHURINE 5.0 03/30/2021 2225   GLUCOSEU NEGATIVE 03/30/2021 2225   GLUCOSEU 100 (A) 08/13/2020 1550   HGBUR NEGATIVE 03/30/2021 2225   BILIRUBINUR NEGATIVE 03/30/2021 2225   KETONESUR NEGATIVE 03/30/2021 2225   PROTEINUR NEGATIVE 03/30/2021 2225   UROBILINOGEN 0.2 08/13/2020 1550   NITRITE NEGATIVE 03/30/2021 2225   LEUKOCYTESUR MODERATE (A) 03/30/2021 2225   Sepsis Labs: @LABRCNTIP (procalcitonin:4,lacticidven:4)  ) Recent Results (from the past 240 hour(s))  Resp Panel by RT-PCR (Flu A&B, Covid) Nasopharyngeal Swab     Status: None   Collection Time: 04/19/21  1:05 PM   Specimen: Nasopharyngeal Swab; Nasopharyngeal(NP) swabs in vial transport medium  Result Value Ref Range Status   SARS Coronavirus 2 by RT PCR NEGATIVE NEGATIVE Final    Comment: (NOTE) SARS-CoV-2 target nucleic acids are NOT DETECTED.  The SARS-CoV-2 RNA is generally detectable in upper respiratory specimens during the acute phase of infection. The lowest concentration of SARS-CoV-2 viral copies this assay can detect is 138 copies/mL. A negative result does not preclude SARS-Cov-2 infection and should not be used as the sole basis for treatment or other patient management decisions. A negative result may occur with  improper specimen  collection/handling, submission of specimen other than nasopharyngeal swab, presence of viral mutation(s) within the areas targeted by this assay, and inadequate number of viral copies(<138 copies/mL). A negative result must be combined with clinical observations, patient history, and epidemiological information. The expected result is Negative.  Fact Sheet for Patients:  EntrepreneurPulse.com.au  Fact Sheet for Healthcare Providers:  IncredibleEmployment.be  This test  is no t yet approved or cleared by the [place] and  has been authorized for detection and/or diagnosis of SARS-CoV-2 by FDA under an Emergency Use Authorization (EUA). This EUA will remain  in effect (meaning this test can be used) for the duration of the COVID-19 declaration under Section 564(b)(1) of the Act, 21 U.S.C.section 360bbb-3(b)(1), unless the authorization is terminated  or revoked sooner.       Influenza A by PCR NEGATIVE NEGATIVE Final   Influenza B by PCR NEGATIVE NEGATIVE Final    Comment: (NOTE) The Xpert Xpress SARS-CoV-2/FLU/RSV plus assay is intended as an aid in the diagnosis of influenza from Nasopharyngeal swab specimens and should not be used as a sole basis for treatment. Nasal washings and aspirates are unacceptable for Xpert Xpress SARS-CoV-2/FLU/RSV testing.  Fact Sheet for Patients: EntrepreneurPulse.com.au  Fact Sheet for Healthcare Providers: IncredibleEmployment.be  This test is not yet approved or cleared by the [place] FDA and has been authorized for detection and/or diagnosis of SARS-CoV-2 by FDA under an Emergency Use Authorization (EUA). This EUA will remain in effect (meaning this test can be used) for the duration of the COVID-19 declaration under Section 564(b)(1) of the Act, 21 U.S.C. section 360bbb-3(b)(1), unless the authorization is terminated or revoked.  Performed at [facility], [place] [address], [place], [place] [address]       Studies: DG Chest 2 View  Result Date: 04/19/2021 CLINICAL DATA:  Left-sided chest pain for 1 week. Shortness of breath. History of COPD. Patient fell 1 week ago. EXAM: CHEST - 2 VIEW COMPARISON:  [place] 04/12/2021.  CT 08/23/2019. FINDINGS: Stable mild cardiomegaly post median sternotomy and CABG. There are mitral annular and aortic calcifications. The lungs appear clear. There is no pleural effusion or pneumothorax. No acute fractures are seen. Several sternotomy wires are chronically fractured. IMPRESSION: Stable chest.  No evidence of acute cardiopulmonary process. Electronically Signed   By: Richardean Sale M.D.   On: 04/19/2021 10:39   CT ABDOMEN PELVIS W CONTRAST  Result Date: 04/19/2021 CLINICAL DATA:  Abdominal pain and nausea. EXAM: CT ABDOMEN AND PELVIS WITH CONTRAST TECHNIQUE: Multidetector CT imaging of the abdomen and pelvis was performed using the standard protocol following bolus administration of intravenous contrast. CONTRAST:  5mL OMNIPAQUE IOHEXOL 350 MG/ML SOLN COMPARISON:  Noncontrast CT 07/28/2014 FINDINGS: Lower chest: Heterogeneous pulmonary parenchyma with areas of subpleural ground-glass opacity in the right lower lobe. Similar findings seen on 08/23/2019 chest CT. Mild cardiomegaly with coronary artery calcifications no pleural fluid. Hepatobiliary: Calcified granuloma in the right lobe of the liver. Diffusely decreased hepatic density typical of steatosis. 19 mm cyst abutting the porta hepatis. Additional low-density abutting the falciform ligament, possible focal fatty infiltration but nonspecific. No suspicious liver lesion. Cholecystectomy without biliary dilatation. Pancreas: Fatty atrophy.  No ductal dilatation or inflammation. Spleen: Normal in size.  Occasional calcified granuloma. Adrenals/Urinary Tract: Normal adrenal glands. Marked bilateral native renal parenchymal atrophy. Calcifications at both renal  hila may be nonobstructing stones or vascular. Left lower quadrant transplant kidney. No transplant hydronephrosis or perinephric edema. There are tiny cortical hypodensities in the transplant that likely represent cysts but are too small to characterize. Transplanted ureter is nondistended. Urinary bladder is partially distended. There is bladder wall thickening about the dome. Similar wall thickening was seen on 2015 exam. Stomach/Bowel: Gastric band in place. Stomach is decompressed. No small bowel obstruction or inflammation. Mild sigmoid colonic diverticulosis without diverticulitis. Small volume of colonic stool. Normal appendix.  Vascular/Lymphatic: Advanced aortic and branch atherosclerosis with calcified and noncalcified atheromatous plaque. No aneurysm or acute vascular finding. Chunky plaque at the iliac 1 bifurcations likely cause some degree of luminal stenosis, not well assessed on this non CTA exam. Patent portal vein. No enlarged lymph nodes in the abdomen or pelvis. Reproductive: Atrophic uterus.  No adnexal mass. Other: No free air, free fluid, or intra-abdominal fluid collection. Musculoskeletal: There are no acute or suspicious osseous abnormalities. Degenerative change throughout the spine. Bilateral hip osteoarthritis, moderately advanced on the left. Scattered bone islands. IMPRESSION: 1. No acute abnormality in the abdomen/pelvis. 2. Left lower quadrant transplant kidney without transplant hydronephrosis or perinephric edema. 3. Mild bladder wall thickening about the dome, similar to 2015 exam, and favored to be chronic. Recommend urinalysis to exclude urinary tract infection. 4. Hepatic steatosis. 5. Colonic diverticulosis without diverticulitis. 6. Heterogeneous pulmonary parenchyma in the lung bases suggestive of small vessel or small airways disease. Areas of subpleural ground-glass opacity in the right lower lobe are not significantly changed from January 2021 chest CT. Aortic  Atherosclerosis (ICD10-I70.0). Electronically Signed   By: Keith Rake M.D.   On: 04/19/2021 15:25    Scheduled Meds:  apixaban  5 mg Oral BID   arformoterol  15 mcg Nebulization BID   azaTHIOprine  125 mg Oral Daily   budesonide  2 mL Nebulization BID   clopidogrel  75 mg Oral Daily   DULoxetine  60 mg Oral q morning   insulin aspart  0-15 Units Subcutaneous Q4H   insulin glargine-yfgn  15 Units Subcutaneous QAC breakfast   isosorbide mononitrate  60 mg Oral Daily   lamoTRIgine  25 mg Oral QHS   metoprolol tartrate  100 mg Oral BID   montelukast  10 mg Oral QHS   pantoprazole  40 mg Oral QAC breakfast   predniSONE  5 mg Oral q morning   rosuvastatin  40 mg Oral Daily   sertraline  100 mg Oral q morning   sodium chloride flush  3 mL Intravenous Q12H    Continuous Infusions:  promethazine (PHENERGAN) injection (IM or IVPB) Stopped (04/19/21 1720)     LOS: 0 days     Cristal Deer, MD [facility]  To reach me or the doctor on call, go to: [email or web address] Password [email or web address]  04/20/2021, 9:09 AM

## 2021-04-20 NOTE — Progress Notes (Addendum)
PT Cancellation Note  Patient Details Name: Katherine [name] MRN: [id number] DOB: 1956/05/14   Cancelled Treatment:    Reason Eval/Treat Not Completed: Patient declined, no reason specified. Attempted PT eval x2 this morning with pt requesting PT return later as she felt she was "too weak" to move at the time. Educated pt each attempt that the PT eval is to assess her capabilities, even if she feels too weak to move, and to prevent further functional decline. Pt requested PT return later, will attempt as time permits.   Addendum 17:14 : Attempted again later in afternoon but pt continues to decline to work with PT, stating "I just want to nap before dinner gets here". Will plan to attempt again tomorrow.    Moishe Spice, PT, DPT Acute Rehabilitation Services  Pager: [phone] Office: [place] 04/20/2021, 11:21 AM

## 2021-04-20 NOTE — Progress Notes (Signed)
Hypoglycemic Event  CBG: 65  Treatment: 8 oz juice/soda  Symptoms: None  Follow-up CBG: Time:0810 CBG Result:85  Possible Reasons for Event: Patient was NPO most of the night. On clear liquid diet, now  Comments/MD notified:yes    Gertie Fey

## 2021-04-20 NOTE — Evaluation (Signed)
Occupational Therapy Evaluation Patient Details Name: Katherine [name] MRN: [id number] DOB: [birth year] Today's Date: 04/20/2021    History of Present Illness Pt is a 65 y.o. female who presented 04/19/21 with nausea, diarrhea, and emesis. Pt admitted with gastroenteritis. PMH: ESRD s/p renal transplant on chronic immunosuppression, CAD s/p CABG and multiple PCI most recently DES to SVG 14 February 2021, PAF on Eliquis, chronic diastolic CHF, COPD, chronic respiratory failure with hypoxia on 3 L supplemental O2 via [place] with exertion and sleep, insulin-dependent T2DM, HTN, HLD, depression/anxiety, chronic pain   Clinical Impression   Pt admitted with above. She demonstrates the below listed deficits and will benefit from continued OT to maximize safety and independence with BADLs.  Pt presents to OT with generalized weakness, decreased activity tolerance, generalized pain, impaired balance.  Pt only participated minimally moving to EOB with min A, and standing with min A, very briefly before abruptly returning to seated position.  She reports she lives alone at home with her dog and cats, and was mod I with ADLs and functional mobility, but with frequent falls.  She reports very limited supports at discharge.  She would benefit from SNF level rehab to maximize her safety and independence with ADLs, but will likely refuse.      Follow Up Recommendations  SNF;Supervision/Assistance - 24 hour    Equipment Recommendations  3 in 1 bedside commode    Recommendations for Other Services       Precautions / Restrictions Precautions Precautions: Fall Precaution Comments: Pt reports h/o falls including 2 falls out of the bed      Mobility Bed Mobility Overal bed mobility: Needs Assistance Bed Mobility: Supine to Sit;Sit to Supine     Supine to sit: Min assist Sit to supine: Min assist   General bed mobility comments: asisst to lift trunk and assist to lift LEs back on the bed    Transfers                       Balance Overall balance assessment: Needs assistance Sitting-balance support: Feet supported Sitting balance-Leahy Scale: Fair     Standing balance support: Single extremity supported Standing balance-Leahy Scale: Poor                             ADL either performed or assessed with clinical judgement   ADL Overall ADL's : Needs assistance/impaired Eating/Feeding: Independent   Grooming: Wash/dry hands;Wash/dry face;Oral care;Set up;Sitting   Upper Body Bathing: Minimal assistance;Sitting   Lower Body Bathing: Maximal assistance;Sit to/from stand   Upper Body Dressing : Minimal assistance;Sitting   Lower Body Dressing: Maximal assistance;Sit to/from stand   Toilet Transfer: Total assistance Toilet Transfer Details (indicate cue type and reason): Pt unable.  She stood once at EOB with max encouragement, then abruptly returned to sitting. Toileting- Clothing Manipulation and Hygiene: Sit to/from stand;Maximal assistance       Functional mobility during ADLs: Minimal assistance (sit to stand only)       Vision Baseline Vision/History: 1 Wears glasses Patient Visual Report: No change from baseline       Perception     Praxis      Pertinent Vitals/Pain Pain Assessment: Faces Faces Pain Scale: Hurts little more Pain Location: genereralized Pain Descriptors / Indicators: Grimacing;Guarding Pain Intervention(s): Monitored during session;Repositioned     Hand Dominance Right   Extremity/Trunk Assessment Upper Extremity Assessment Upper Extremity Assessment: Generalized weakness  Lower Extremity Assessment Lower Extremity Assessment: Defer to PT evaluation   Cervical / Trunk Assessment Cervical / Trunk Assessment: Other exceptions (morbidly obese)   Communication Communication Communication: No difficulties   Cognition Arousal/Alertness: Awake/alert Behavior During Therapy: Anxious Overall Cognitive Status: No  family/caregiver present to determine baseline cognitive functioning                                 General Comments: Pt  required max prompting to participate.  She was tearful throughout the session.  She verbalized feelings of depression, hopelessness, helplessness, and nothing to live for.  She had just hung up the phone after talking with her brother and reports that his landlord won't allow her to stay with him at discharge.  She proceeded to share that she has had several family members die in the past several years, including her spouse, brother, mother, and niece, and she feels like she has not supports.  Attempted to direct the conversation to things that she had control over and what she could do to improve her situation, but she would not engage there.  Pt then proceeded to say that she wanted to kill herself, but couldn't do that here in the hospital and would have to wait until she went home.  RN and MD made aware.   General Comments       Exercises     Shoulder Instructions      Home Living Family/patient expects to be discharged to:: Private residence Living Arrangements: Alone Available Help at Discharge: Family;Available PRN/intermittently Type of Home: Mobile home Home Access: Stairs to enter Entrance Stairs-Number of Steps: 3 Entrance Stairs-Rails: Left Home Layout: One level     Bathroom Shower/Tub: Teacher, early years/pre: Standard     Home Equipment: Environmental consultant - 4 wheels;Cane - single point;Tub bench   Additional Comments: wears 3L of supplemental O2 at home      Prior Functioning/Environment Level of Independence: Independent with assistive device(s)        Comments: uses rollator PRN.  Pt reports frequent falls and reports that she has been struggling to manage at home, and that her house is "a mess"        OT Problem List: Decreased strength;Decreased activity tolerance;Impaired balance (sitting and/or standing);Decreased  safety awareness;Decreased knowledge of use of DME or AE;Cardiopulmonary status limiting activity;Obesity;Pain      OT Treatment/Interventions: Self-care/ADL training;DME and/or AE instruction;Therapeutic activities;Patient/family education;Balance training;Therapeutic exercise;Energy conservation    OT Goals(Current goals can be found in the care plan section) Acute Rehab OT Goals Patient Stated Goal: to not go to a facility OT Goal Formulation: With patient Time For Goal Achievement: 05/04/21 Potential to Achieve Goals: Good ADL Goals Pt Will Perform Grooming: with min guard assist;standing Pt Will Perform Upper Body Bathing: with set-up;sitting Pt Will Perform Lower Body Bathing: with min guard assist;sit to/from stand Pt Will Perform Upper Body Dressing: with set-up;sitting Pt Will Perform Lower Body Dressing: with min guard assist;sit to/from stand Pt Will Transfer to Toilet: with min guard assist;ambulating;regular height toilet;bedside commode;grab bars Pt Will Perform Toileting - Clothing Manipulation and hygiene: with min guard assist;sit to/from stand  OT Frequency: Min 2X/week   Barriers to D/C: Decreased caregiver support          Co-evaluation              AM-PAC OT "6 Clicks" Daily Activity     Outcome  Measure Help from another person eating meals?: None Help from another person taking care of personal grooming?: A Little Help from another person toileting, which includes using toliet, bedpan, or urinal?: A Lot Help from another person bathing (including washing, rinsing, drying)?: A Lot Help from another person to put on and taking off regular upper body clothing?: A Little Help from another person to put on and taking off regular lower body clothing?: A Lot 6 Click Score: 16   End of Session Nurse Communication: Mobility status  Activity Tolerance: Other (comment) (pt only willing to participate minimally) Patient left: in bed;with call bell/phone within  reach;with bed alarm set;with nursing/sitter in room  OT Visit Diagnosis: Unsteadiness on feet (R26.81);Repeated falls (R29.6)                Time: 5170-0174 OT Time Calculation (min): 39 min Charges:  OT General Charges $OT Visit: 1 Visit OT Evaluation $OT Eval Moderate Complexity: 1 Mod OT Treatments $Therapeutic Activity: 23-37 mins  Nilsa Nutting., OTR/L Acute Rehabilitation Services Pager [phone] Office [place], [place] 04/20/2021, 3:12 PM

## 2021-04-21 DIAGNOSIS — E1122 Type 2 diabetes mellitus with diabetic chronic kidney disease: Secondary | ICD-10-CM | POA: Diagnosis present

## 2021-04-21 DIAGNOSIS — E1159 Type 2 diabetes mellitus with other circulatory complications: Secondary | ICD-10-CM | POA: Diagnosis not present

## 2021-04-21 DIAGNOSIS — Z20822 Contact with and (suspected) exposure to covid-19: Secondary | ICD-10-CM | POA: Diagnosis present

## 2021-04-21 DIAGNOSIS — E1142 Type 2 diabetes mellitus with diabetic polyneuropathy: Secondary | ICD-10-CM | POA: Diagnosis present

## 2021-04-21 DIAGNOSIS — I480 Paroxysmal atrial fibrillation: Secondary | ICD-10-CM | POA: Diagnosis present

## 2021-04-21 DIAGNOSIS — I132 Hypertensive heart and chronic kidney disease with heart failure and with stage 5 chronic kidney disease, or end stage renal disease: Secondary | ICD-10-CM | POA: Diagnosis present

## 2021-04-21 DIAGNOSIS — F32A Depression, unspecified: Secondary | ICD-10-CM | POA: Diagnosis present

## 2021-04-21 DIAGNOSIS — Z940 Kidney transplant status: Secondary | ICD-10-CM

## 2021-04-21 DIAGNOSIS — Z6841 Body Mass Index (BMI) 40.0 and over, adult: Secondary | ICD-10-CM | POA: Diagnosis not present

## 2021-04-21 DIAGNOSIS — R112 Nausea with vomiting, unspecified: Secondary | ICD-10-CM | POA: Diagnosis not present

## 2021-04-21 DIAGNOSIS — Z66 Do not resuscitate: Secondary | ICD-10-CM | POA: Diagnosis present

## 2021-04-21 DIAGNOSIS — J449 Chronic obstructive pulmonary disease, unspecified: Secondary | ICD-10-CM | POA: Diagnosis present

## 2021-04-21 DIAGNOSIS — E785 Hyperlipidemia, unspecified: Secondary | ICD-10-CM | POA: Diagnosis not present

## 2021-04-21 DIAGNOSIS — N186 End stage renal disease: Secondary | ICD-10-CM | POA: Diagnosis present

## 2021-04-21 DIAGNOSIS — D84821 Immunodeficiency due to drugs: Secondary | ICD-10-CM | POA: Diagnosis present

## 2021-04-21 DIAGNOSIS — N179 Acute kidney failure, unspecified: Secondary | ICD-10-CM | POA: Diagnosis present

## 2021-04-21 DIAGNOSIS — M069 Rheumatoid arthritis, unspecified: Secondary | ICD-10-CM | POA: Diagnosis present

## 2021-04-21 DIAGNOSIS — I152 Hypertension secondary to endocrine disorders: Secondary | ICD-10-CM | POA: Diagnosis not present

## 2021-04-21 DIAGNOSIS — J9611 Chronic respiratory failure with hypoxia: Secondary | ICD-10-CM | POA: Diagnosis present

## 2021-04-21 DIAGNOSIS — E1169 Type 2 diabetes mellitus with other specified complication: Secondary | ICD-10-CM | POA: Diagnosis present

## 2021-04-21 DIAGNOSIS — E11649 Type 2 diabetes mellitus with hypoglycemia without coma: Secondary | ICD-10-CM | POA: Diagnosis present

## 2021-04-21 DIAGNOSIS — R0789 Other chest pain: Secondary | ICD-10-CM | POA: Diagnosis present

## 2021-04-21 DIAGNOSIS — E860 Dehydration: Secondary | ICD-10-CM | POA: Diagnosis present

## 2021-04-21 DIAGNOSIS — I5032 Chronic diastolic (congestive) heart failure: Secondary | ICD-10-CM | POA: Diagnosis present

## 2021-04-21 DIAGNOSIS — A084 Viral intestinal infection, unspecified: Secondary | ICD-10-CM | POA: Diagnosis present

## 2021-04-21 DIAGNOSIS — I248 Other forms of acute ischemic heart disease: Secondary | ICD-10-CM | POA: Diagnosis present

## 2021-04-21 DIAGNOSIS — E1151 Type 2 diabetes mellitus with diabetic peripheral angiopathy without gangrene: Secondary | ICD-10-CM | POA: Diagnosis present

## 2021-04-21 LAB — BASIC METABOLIC PANEL
Anion gap: 8 (ref 5–15)
BUN: 7 mg/dL — ABNORMAL LOW (ref 8–23)
CO2: 25 mmol/L (ref 22–32)
Calcium: 8.6 mg/dL — ABNORMAL LOW (ref 8.9–10.3)
Chloride: 103 mmol/L (ref 98–111)
Creatinine, Ser: 1.06 mg/dL — ABNORMAL HIGH (ref 0.44–1.00)
GFR, Estimated: 59 mL/min — ABNORMAL LOW (ref >60)
Glucose, Bld: 183 mg/dL — ABNORMAL HIGH (ref 70–99)
Potassium: 4 mmol/L (ref 3.5–5.1)
Sodium: 136 mmol/L (ref 135–145)

## 2021-04-21 LAB — GLUCOSE, CAPILLARY
Glucose-Capillary: 121 mg/dL — ABNORMAL HIGH (ref 70–99)
Glucose-Capillary: 128 mg/dL — ABNORMAL HIGH (ref 70–99)
Glucose-Capillary: 184 mg/dL — ABNORMAL HIGH (ref 70–99)
Glucose-Capillary: 186 mg/dL — ABNORMAL HIGH (ref 70–99)
Glucose-Capillary: 210 mg/dL — ABNORMAL HIGH (ref 70–99)
Glucose-Capillary: 267 mg/dL — ABNORMAL HIGH (ref 70–99)
Glucose-Capillary: 67 mg/dL — ABNORMAL LOW (ref 70–99)

## 2021-04-21 LAB — MAGNESIUM: Magnesium: 1.5 mg/dL — ABNORMAL LOW (ref 1.7–2.4)

## 2021-04-21 MED ORDER — LORAZEPAM 1 MG PO TABS
1.0000 mg | ORAL_TABLET | Freq: Two times a day (BID) | ORAL | Status: DC
  Administered 2021-04-21 – 2021-04-22 (×3): 1 mg via ORAL
  Filled 2021-04-21 (×3): qty 1

## 2021-04-21 MED ORDER — PHENAZOPYRIDINE HCL 100 MG PO TABS
100.0000 mg | ORAL_TABLET | Freq: Three times a day (TID) | ORAL | Status: DC
  Administered 2021-04-21 – 2021-04-22 (×5): 100 mg via ORAL
  Filled 2021-04-21 (×6): qty 1

## 2021-04-21 MED ORDER — ONDANSETRON HCL 4 MG PO TABS
4.0000 mg | ORAL_TABLET | Freq: Four times a day (QID) | ORAL | Status: DC | PRN
  Administered 2021-04-21: 4 mg via ORAL
  Filled 2021-04-21: qty 1

## 2021-04-21 MED ORDER — SODIUM CHLORIDE 0.9 % IV SOLN
1.0000 g | INTRAVENOUS | Status: DC
  Administered 2021-04-21 – 2021-04-22 (×2): 1 g via INTRAVENOUS
  Filled 2021-04-21 (×2): qty 10

## 2021-04-21 NOTE — Plan of Care (Signed)
  Problem: Education: Goal: Knowledge of General Education information will improve Description: Including pain rating scale, medication(s)/side effects and non-pharmacologic comfort measures Outcome: Progressing   Problem: Health Behavior/Discharge Planning: Goal: Ability to manage health-related needs will improve Outcome: Progressing   Problem: Clinical Measurements: Goal: Ability to maintain clinical measurements within normal limits will improve Outcome: Progressing Goal: Diagnostic test results will improve Outcome: Progressing Goal: Cardiovascular complication will be avoided Outcome: Progressing   Problem: Pain Managment: Goal: General experience of comfort will improve Outcome: Progressing

## 2021-04-21 NOTE — Evaluation (Signed)
Physical Therapy Evaluation Patient Details Name: Katherine [name] MRN: [id number] DOB: June 30, 1956 Today's Date: 04/21/2021   History of Present Illness  Pt is a 65 y.o. female who presented 04/19/21 with nausea, diarrhea, and emesis. Pt admitted with gastroenteritis. PMH: ESRD s/p renal transplant on chronic immunosuppression, CAD s/p CABG and multiple PCI most recently DES to SVG 14 February 2021, PAF on Eliquis, chronic diastolic CHF, COPD, chronic respiratory failure with hypoxia on 3 L supplemental O2 via [place] with exertion and sleep, insulin-dependent T2DM, HTN, HLD, depression/anxiety, chronic pain  Clinical Impression  Pt admitted with above diagnosis. At baseline, pt resides alone and was ambulating with rollator and performing ADLS/IADLs but reports becoming more difficult.  She has also had 4 recent falls.  Today, pt requiring max encouragement to participate.   She required min guard-min A for transfers and to ambulate 29' with RW.  She did fatigue easily.  AM-PAC score of 18 but do recommend SNF due to weakness, below baseline, lives alone, and new recent falls.  Pt currently with functional limitations due to the deficits listed below (see PT Problem List). Pt will benefit from skilled PT to increase their independence and safety with mobility to allow discharge to the venue listed below.       Follow Up Recommendations SNF    Equipment Recommendations  None recommended by PT    Recommendations for Other Services       Precautions / Restrictions Precautions Precautions: Fall      Mobility  Bed Mobility Overal bed mobility: Needs Assistance Bed Mobility: Supine to Sit;Sit to Supine     Supine to sit: Supervision Sit to supine: Supervision        Transfers Overall transfer level: Needs assistance Equipment used: Rolling walker (2 wheeled) Transfers: Sit to/from Stand Sit to Stand: Min guard            Ambulation/Gait Ambulation/Gait assistance: Min Management consultant (Feet): 35 Feet Assistive device: Rolling walker (2 wheeled) Gait Pattern/deviations: Step-to pattern;Decreased stride length;Shuffle Gait velocity: decreased   General Gait Details: Min A to steady  Financial trader Rankin (Stroke Patients Only)       Balance Overall balance assessment: Needs assistance Sitting-balance support: Feet supported Sitting balance-Leahy Scale: Good     Standing balance support: Bilateral upper extremity supported;No upper extremity supported Standing balance-Leahy Scale: Fair Standing balance comment: RW to ambulate but could static stand without AD                             Pertinent Vitals/Pain Pain Assessment: No/denies pain    Home Living Family/patient expects to be discharged to:: Private residence Living Arrangements: Alone Available Help at Discharge: Family;Available PRN/intermittently Type of Home: Mobile home Home Access: Stairs to enter Entrance Stairs-Rails: Left Entrance Stairs-Number of Steps: 3 Home Layout: One level Home Equipment: Walker - 4 wheels;Cane - single point;Tub bench;Grab bars - toilet;Wheelchair - manual Additional Comments: wears 3L of supplemental O2 at home    Prior Function Level of Independence: Independent with assistive device(s)   Gait / Transfers Assistance Needed: Uses rollator and can do short community distances; amount of walking depends on pain  ADL's / Homemaking Assistance Needed: Pt reports independent with ADLs (doing sponge baths recently)  Comments: Reports 4 recent falls ; about 2 per month recently; lives at home with dog  and 3 cats (lots of joy from her pets) - worries about what she will do with her pets if she has to go to ALF     Hand Dominance        Extremity/Trunk Assessment   Upper Extremity Assessment Upper Extremity Assessment: Generalized weakness    Lower Extremity Assessment Lower Extremity Assessment:  LLE deficits/detail;RLE deficits/detail RLE Deficits / Details: ROM WFL; MMT ankle 5/5, knee and hip 4/5 LLE Deficits / Details: ROM WFL; MMT ankle 5/5, knee and hip 4/5       Communication      Cognition Arousal/Alertness: Awake/alert Behavior During Therapy: WFL for tasks assessed/performed Overall Cognitive Status: Within Functional Limits for tasks assessed                                 General Comments: Required max encouragement/education to participate.  She was oriented and following all commands.      General Comments General comments (skin integrity, edema, etc.): VSS on 3 L O2    Exercises     Assessment/Plan    PT Assessment Patient needs continued PT services  PT Problem List Decreased strength;Decreased mobility;Decreased range of motion;Decreased activity tolerance;Cardiopulmonary status limiting activity;Decreased balance;Decreased knowledge of use of DME       PT Treatment Interventions DME instruction;Therapeutic activities;Gait training;Therapeutic exercise;Patient/family education;Stair training;Balance training;Functional mobility training    PT Goals (Current goals can be found in the Care Plan section)  Acute Rehab PT Goals Patient Stated Goal: worried about her pets at home but would consider short term SNF PT Goal Formulation: With patient Time For Goal Achievement: 05/05/21 Potential to Achieve Goals: Good    Frequency Min 3X/week   Barriers to discharge Decreased caregiver support      Co-evaluation               AM-PAC PT "6 Clicks" Mobility  Outcome Measure Help needed turning from your back to your side while in a flat bed without using bedrails?: A Little Help needed moving from lying on your back to sitting on the side of a flat bed without using bedrails?: A Little Help needed moving to and from a bed to a chair (including a wheelchair)?: A Little Help needed standing up from a chair using your arms (e.g.,  wheelchair or bedside chair)?: A Little Help needed to walk in hospital room?: A Little Help needed climbing 3-5 steps with a railing? : A Little 6 Click Score: 18    End of Session Equipment Utilized During Treatment: Gait belt Activity Tolerance: Patient tolerated treatment well Patient left: in bed;with call bell/phone within reach;with bed alarm set Nurse Communication: Mobility status PT Visit Diagnosis: Other abnormalities of gait and mobility (R26.89);Repeated falls (R29.6);Muscle weakness (generalized) (M62.81)    Time: 5462-7035 PT Time Calculation (min) (ACUTE ONLY): 35 min   Charges:   PT Evaluation $PT Eval Low Complexity: 1 Low PT Treatments $Gait Training: 8-22 mins        Abran Richard, PT Acute Rehab Services Pager [phone] [facility] [place] 04/21/2021, 2:16 PM

## 2021-04-21 NOTE — Progress Notes (Signed)
Hypoglycemic Event  CBG: 67  Treatment: 4 oz juice/soda  Symptoms: None  Follow-up CBG: Time:15 CBG Result:121  Possible Reasons for Event: Unknown  Comments/MD notified:per protocol    Katherine [name]

## 2021-04-21 NOTE — Plan of Care (Signed)
  Problem: Education: Goal: Knowledge of General Education information will improve Description Including pain rating scale, medication(s)/side effects and non-pharmacologic comfort measures Outcome: Progressing   Problem: Health Behavior/Discharge Planning: Goal: Ability to manage health-related needs will improve Outcome: Progressing   

## 2021-04-21 NOTE — Progress Notes (Signed)
PROGRESS NOTE  Katherine [name] BSW:[id number] DOB: 12/26/1955 DOA: 04/19/2021 PCP: [name], MD  Brief narrative:  Patient is a is a 65 year old female with medical history significant for end-stage renal disease status post renal transplant on chronic immunosuppression, coronary disease status post CABG and multiple PCI most recently DES to SVG on February 14, 2021, paroxysmal atrial fibrillation on Eliquis, chronic diastolic CHF, COPD, chronic respiratory failure with hypoxia on 3 L/min supplemental oxygen via nasal cannula, insulin-dependent diabetes mellitus, hypertension, hyperlipidemia, depression, anxiety, chronic pain, presented to the hospital for nausea vomiting abdominal pain and diarrhea.  Unable to tolerate any p.o. intake.  In the ED patient was on supplemental oxygen and creatinine was mildly elevated.  COVID test was negative.  Assessment/Plan: Principal Problem:   Nausea & vomiting Active Problems:   Dyslipidemia, goal LDL below 70   Hypertension associated with diabetes ([place])   Paroxysmal atrial fibrillation ([place]); CHA2DS2VASc score F, HTN, CAD, CVA = 5   Type 2 diabetes mellitus with peripheral neuropathy ([place])   Renal transplant, status post   Acute kidney injury (HCC)  Intractable nausea, vomiting, diarrhea Slightly improved.  Likely secondary to viral gastroenteritis.  Still feels nauseated.  Wishes Phenergan.  States that Zofran does not work.  CT scan of the abdomen pelvis did not show any acute abnormality.  Has been started on oral diet.  We will continue to monitor closely  Acute kidney injury in the setting of end-stage renal disease status post renal transplant.   CT scan of the abdomen without acute findings.  Improving renal failure.  Continue to monitor renal function.  Encourage oral intake.  Continue azathioprine.  Dysuria.  Abnormal urinalysis.  Patient also complains of dysuria, urgency.  Could be having UTI.  We will get a urine culture prior to  starting Rocephin IV.   Paroxysmal atrial fibrillation  Continue Lopressor Eliquis.  Tikosyn on hold.  Currently in sinus rhythm.    Coronary disease status post CABG and multiple PCI Mild elevation of troponin secondary to demand ischemia.  Continue Plavix Imdur and statins.  No active issues at this time.  Chronic diastolic congestive heart failure Last known LV ejection fraction of 50 to 60%.  Patient appears to be volume depleted from GI loss.  Continue IV fluid hydration.  Lasix on hold at this time.  Continue intake and output charting.    Insulin-dependent diabetes mellitus type 2  continue home Basaglar 50 units daily continue sliding scale insulin.  Metformin and Amaryl on hold.    Hypokalemia Improved after replacement.  Depression  continue Cymbalta Lamictal and Zoloft.  We will continue to monitor   Deconditioning, debility  Lives by herself at home.  Physical therapy pending.  Occupational Therapy recommended skilled nursing facility placement.  Patient states that she wishes to go home with home with physical therapy   Fibromyalgia/ chronic pain syndrome.  continue Cymbalta and opiates.  Hyperlipidemia continue rosuvastatin  Essential hypertension  Continue Imdur and metoprolol.  COPD with chronic respiratory failure with hypoxia. Patient is chronically on 3 L/min O2 nasal cannula at home Continue Brovana Pulmicort Singulair albuterol as needed  Code Status: DNR  Family Communication:  None   Disposition Plan: To be determined Status is: Inpatient   Dispo: The patient is from: Home              Anticipated d/c is to: Home with home health/skilled nursing facility.  PT evaluation pending.  Anticipated d/c date is: 1 to 2 days              Patient currently not medically stable for discharge  Consultants: None  Procedures: CT abdomen  Antimicrobials: None  DVT prophylaxis: Apixaban  Subjective Today, patient was seen and examined at  bedside.  Patient complains of mild headache, nausea and dysuria urgency.  Objective: Vitals:   04/21/21 0010 04/21/21 0414 04/21/21 0729 04/21/21 0806  BP: (!) 120/58 113/86 (!) 137/56   Pulse: 60 (!) 57 70 73  Resp: 16 16  16   Temp: 98.6 F (37 C) 98 F (36.7 C) 99 F (37.2 C)   TempSrc: Oral Oral Oral   SpO2:  96% 95% 95%  Weight:        Intake/Output Summary (Last 24 hours) at 04/21/2021 1113 Last data filed at 04/21/2021 1033 Gross per 24 hour  Intake 1520 ml  Output 1620 ml  Net -100 ml    Filed Weights   04/19/21 2300 04/20/21 0300  Weight: 93.9 kg 93.8 kg   Body mass index is 39.07 kg/m.  Physical exam: General:  Average built, not in obvious distress, obese HENT:   No scleral pallor or icterus noted. Oral mucosa is moist.  Chest:  Clear breath sounds.  Diminished breath sounds bilaterally. No crackles or wheezes.  CABG scar in place. CVS: S1 &S2 heard. No murmur.  Regular rate and rhythm. Abdomen: Soft, nontender, obese, nondistended.  Bowel sounds are heard.   Extremities: No cyanosis, clubbing but trace edema.  Peripheral pulses are palpable. Psych: Alert, awake and oriented, normal mood CNS:  No cranial nerve deficits.  Power equal in all extremities.   Skin: Warm and dry.  No rashes noted.   Data Reviewed: CBC: Recent Labs  Lab 04/19/21 0938 04/20/21 0648  WBC 9.0 5.5  HGB 11.8* 10.4*  HCT 38.3 34.9*  MCV 97.2 102.0*  PLT 363 242    Basic Metabolic Panel: Recent Labs  Lab 04/19/21 0938 04/19/21 1305 04/20/21 0648 04/21/21 0130  NA 140  --  137 136  K 3.4*  --  3.8 4.0  CL 108  --  109 103  CO2 20*  --  18* 25  GLUCOSE 190*  --  70 183*  BUN 8  --  7* 7*  CREATININE 1.33*  --  1.13* 1.06*  CALCIUM 9.1  --  8.4* 8.6*  MG  --  1.6* 1.5* 1.5*    GFR: Estimated Creatinine Clearance: 56.8 mL/min (A) (by C-G formula based on SCr of 1.06 mg/dL (H)). Liver Function Tests: Recent Labs  Lab 04/19/21 1305  AST 33  ALT 10  ALKPHOS 116   BILITOT 0.7  PROT 6.7  ALBUMIN 2.5*    Recent Labs  Lab 04/19/21 1305  LIPASE 17    No results for input(s): AMMONIA in the last 168 hours. Coagulation Profile: No results for input(s): INR, PROTIME in the last 168 hours. Cardiac Enzymes: No results for input(s): CKTOTAL, CKMB, CKMBINDEX, TROPONINI in the last 168 hours. BNP (last 3 results) No results for input(s): PROBNP in the last 8760 hours. HbA1C: No results for input(s): HGBA1C in the last 72 hours. CBG: Recent Labs  Lab 04/21/21 0006 04/21/21 0409 04/21/21 0758 04/21/21 0850 04/21/21 1106  GLUCAP 210* 128* 67* 121* 186*    Lipid Profile: No results for input(s): CHOL, HDL, LDLCALC, TRIG, CHOLHDL, LDLDIRECT in the last 72 hours. Thyroid Function Tests: No results for input(s): TSH, T4TOTAL, FREET4, T3FREE, THYROIDAB  in the last 72 hours. Anemia Panel: No results for input(s): VITAMINB12, FOLATE, FERRITIN, TIBC, IRON, RETICCTPCT in the last 72 hours. Urine analysis:    Component Value Date/Time   COLORURINE AMBER (A) 04/20/2021 1231   APPEARANCEUR CLOUDY (A) 04/20/2021 1231   LABSPEC 1.018 04/20/2021 1231   PHURINE 5.0 04/20/2021 1231   GLUCOSEU NEGATIVE 04/20/2021 1231   GLUCOSEU 100 (A) 08/13/2020 1550   HGBUR NEGATIVE 04/20/2021 1231   BILIRUBINUR NEGATIVE 04/20/2021 1231   KETONESUR NEGATIVE 04/20/2021 1231   PROTEINUR 100 (A) 04/20/2021 1231   UROBILINOGEN 0.2 08/13/2020 1550   NITRITE POSITIVE (A) 04/20/2021 1231   LEUKOCYTESUR LARGE (A) 04/20/2021 1231   Sepsis Labs: @LABRCNTIP (procalcitonin:4,lacticidven:4)  ) Recent Results (from the past 240 hour(s))  Resp Panel by RT-PCR (Flu A&B, Covid) Nasopharyngeal Swab     Status: None   Collection Time: 04/19/21  1:05 PM   Specimen: Nasopharyngeal Swab; Nasopharyngeal(NP) swabs in vial transport medium  Result Value Ref Range Status   SARS Coronavirus 2 by RT PCR NEGATIVE NEGATIVE Final    Comment: (NOTE) SARS-CoV-2 target nucleic acids are NOT  DETECTED.  The SARS-CoV-2 RNA is generally detectable in upper respiratory specimens during the acute phase of infection. The lowest concentration of SARS-CoV-2 viral copies this assay can detect is 138 copies/mL. A negative result does not preclude SARS-Cov-2 infection and should not be used as the sole basis for treatment or other patient management decisions. A negative result may occur with  improper specimen collection/handling, submission of specimen other than nasopharyngeal swab, presence of viral mutation(s) within the areas targeted by this assay, and inadequate number of viral copies(<138 copies/mL). A negative result must be combined with clinical observations, patient history, and epidemiological information. The expected result is Negative.  Fact Sheet for Patients:  EntrepreneurPulse.com.au  Fact Sheet for Healthcare Providers:  IncredibleEmployment.be  This test is no t yet approved or cleared by the [place] FDA and  has been authorized for detection and/or diagnosis of SARS-CoV-2 by FDA under an Emergency Use Authorization (EUA). This EUA will remain  in effect (meaning this test can be used) for the duration of the COVID-19 declaration under Section 564(b)(1) of the Act, 21 U.S.C.section 360bbb-3(b)(1), unless the authorization is terminated  or revoked sooner.       Influenza A by PCR NEGATIVE NEGATIVE Final   Influenza B by PCR NEGATIVE NEGATIVE Final    Comment: (NOTE) The Xpert Xpress SARS-CoV-2/FLU/RSV plus assay is intended as an aid in the diagnosis of influenza from Nasopharyngeal swab specimens and should not be used as a sole basis for treatment. Nasal washings and aspirates are unacceptable for Xpert Xpress SARS-CoV-2/FLU/RSV testing.  Fact Sheet for Patients: EntrepreneurPulse.com.au  Fact Sheet for Healthcare Providers: IncredibleEmployment.be  This test is not yet  approved or cleared by the [place] FDA and has been authorized for detection and/or diagnosis of SARS-CoV-2 by FDA under an Emergency Use Authorization (EUA). This EUA will remain in effect (meaning this test can be used) for the duration of the COVID-19 declaration under Section 564(b)(1) of the Act, 21 U.S.C. section 360bbb-3(b)(1), unless the authorization is terminated or revoked.  Performed at [facility], [place] [address], [place], [place] [address]      Studies: No results found.  Scheduled Meds:  apixaban  5 mg Oral BID   arformoterol  15 mcg Nebulization BID   azaTHIOprine  125 mg Oral Daily   budesonide  2 mL Nebulization BID   clopidogrel  75 mg Oral Daily   DULoxetine  60 mg Oral q morning   insulin aspart  0-15 Units Subcutaneous Q4H   insulin glargine-yfgn  15 Units Subcutaneous QAC breakfast   isosorbide mononitrate  60 mg Oral Daily   lamoTRIgine  25 mg Oral QHS   LORazepam  1 mg Oral BID   magnesium oxide  800 mg Oral Daily   metoprolol tartrate  100 mg Oral BID   montelukast  10 mg Oral QHS   pantoprazole  40 mg Oral QAC breakfast   phenazopyridine  100 mg Oral TID WC   predniSONE  5 mg Oral q morning   rosuvastatin  40 mg Oral Daily   sertraline  100 mg Oral q morning   sodium chloride flush  3 mL Intravenous Q12H    Continuous Infusions:  promethazine (PHENERGAN) injection (IM or IVPB) Stopped (04/19/21 1720)     LOS: 0 days    Flora Lipps, MD [facility]  04/21/2021, 11:13 AM

## 2021-04-21 NOTE — Plan of Care (Signed)
  Problem: Education: Goal: Knowledge of General Education information will improve Description: Including pain rating scale, medication(s)/side effects and non-pharmacologic comfort measures Outcome: Progressing   Problem: Health Behavior/Discharge Planning: Goal: Ability to manage health-related needs will improve Outcome: Progressing   Problem: Education: Goal: Ability to demonstrate management of disease process will improve Outcome: Progressing Goal: Ability to verbalize understanding of medication therapies will improve Outcome: Progressing   

## 2021-04-22 LAB — BASIC METABOLIC PANEL
Anion gap: 5 (ref 5–15)
BUN: 8 mg/dL (ref 8–23)
CO2: 27 mmol/L (ref 22–32)
Calcium: 8.7 mg/dL — ABNORMAL LOW (ref 8.9–10.3)
Chloride: 107 mmol/L (ref 98–111)
Creatinine, Ser: 1.06 mg/dL — ABNORMAL HIGH (ref 0.44–1.00)
GFR, Estimated: 59 mL/min — ABNORMAL LOW (ref >60)
Glucose, Bld: 140 mg/dL — ABNORMAL HIGH (ref 70–99)
Potassium: 3.6 mmol/L (ref 3.5–5.1)
Sodium: 139 mmol/L (ref 135–145)

## 2021-04-22 LAB — CBC
HCT: 29.8 % — ABNORMAL LOW (ref 36.0–46.0)
Hemoglobin: 9.6 g/dL — ABNORMAL LOW (ref 12.0–15.0)
MCH: 30.6 pg (ref 26.0–34.0)
MCHC: 32.2 g/dL (ref 30.0–36.0)
MCV: 94.9 fL (ref 80.0–100.0)
Platelets: 241 10*3/uL (ref 150–400)
RBC: 3.14 MIL/uL — ABNORMAL LOW (ref 3.87–5.11)
RDW: 15.5 % (ref 11.5–15.5)
WBC: 6.6 10*3/uL (ref 4.0–10.5)
nRBC: 0 % (ref 0.0–0.2)

## 2021-04-22 LAB — GLUCOSE, CAPILLARY
Glucose-Capillary: 109 mg/dL — ABNORMAL HIGH (ref 70–99)
Glucose-Capillary: 122 mg/dL — ABNORMAL HIGH (ref 70–99)
Glucose-Capillary: 123 mg/dL — ABNORMAL HIGH (ref 70–99)
Glucose-Capillary: 133 mg/dL — ABNORMAL HIGH (ref 70–99)
Glucose-Capillary: 206 mg/dL — ABNORMAL HIGH (ref 70–99)
Glucose-Capillary: 246 mg/dL — ABNORMAL HIGH (ref 70–99)

## 2021-04-22 LAB — PHOSPHORUS: Phosphorus: 3 mg/dL (ref 2.5–4.6)

## 2021-04-22 LAB — MAGNESIUM: Magnesium: 1.6 mg/dL — ABNORMAL LOW (ref 1.7–2.4)

## 2021-04-22 MED ORDER — MAGNESIUM OXIDE -MG SUPPLEMENT 400 (240 MG) MG PO TABS: 400.0000 mg | ORAL_TABLET | Freq: Every day | ORAL | 0 refills | Status: DC

## 2021-04-22 MED ORDER — PHENAZOPYRIDINE HCL 100 MG PO TABS: 100.0000 mg | ORAL_TABLET | Freq: Three times a day (TID) | ORAL | 0 refills | Status: DC

## 2021-04-22 MED ORDER — CEPHALEXIN 250 MG PO CAPS: 250.0000 mg | ORAL_CAPSULE | Freq: Three times a day (TID) | ORAL | 0 refills | Status: AC

## 2021-04-22 NOTE — Consult Note (Signed)
   [facility] Inpatient Consult   04/22/2021  Katherine [name] 06-02-1956 [id number]  [facility] [employer] [ACO] Patient: Medicare CMS DCE  Primary Care Provider:  [name], MD, Pietro Cassis, is an Embedded provider   Patient was screened for [place] Management services. Patient will have the transition of care call conducted by the primary care provider. Patient has been active in the chronic care management with the Embedded Care Management team. Patient with extreme high risk scores for unplanned readmission risk. Met with the patient at the bedside.  She was resting quietly but awaken when call her name.  She states she is awaiting for family to get off of work.  Review of inpatient TOC notes reveals patient was in need of transportation.  She said she is not eating well and asked if she needed meals on wheels.  She states, "no I use [employer] to get groceries but I am not eating that good to be honest." She states she is going to her brothers.    Plan: Will reach out to the [place] Management and make aware of transition needs and recommendations. She would like to consider having prepared food when she returns to her residence from her brother's house.  Please contact for further questions,  Natividad Brood, RN BSN [facility] Liaison  [phone] business mobile phone Toll free office [phone]  Fax number: [phone] [name]

## 2021-04-22 NOTE — TOC Initial Note (Signed)
Transition of Care  [facility]) - Initial/Assessment Note    Patient Details  Name: Katherine [name] MRN: [id number] Date of Birth: 04/30/1956  Transition of Care [facility]) CM/SW Contact:    Bethann Berkshire, [place] Phone Number: 04/22/2021, 10:00 AM  Clinical Narrative:                  CSW met with pt to discuss discharge plans. CSW explains SNF recommendation. Pt very quickly refuses and states she has animals (1 dog, 3 cats) at home to take care of. CSW has lengthy discussion about SNF vs HH and explains differences in services. CSW discusses potential options for pets to be taken care of by family while in SNF. Pt maintains she does not want to go to SNF. She plans to temporarily move in with her brother who she states will help take care of her. He will be able to use her care to take care of her pets at home. She also reports having a friend who is a nurse who helps her manage her medications. Pt has a rolling walker at home; does not want a 3-in-1.   Pt has O2 from [employer]; Equipment would need to be moved to her brothers home; [address] [place], [place] [address]. Pt has had [facility] in the past and would like to use them for [facility]. Pt is not sure how she will transport home at this time; may need PTAR.  Expected Discharge Plan: [place] Barriers to Discharge: Continued Medical Work up   Patient Goals and CMS Choice Patient states their goals for this hospitalization and ongoing recovery are:: Return home with pets   Choice offered to / list presented to : Patient  Expected Discharge Plan and Services Expected Discharge Plan: [place] Choice: [place] arrangements for the past 2 months: Single Family Home                                      Prior Living Arrangements/Services Living arrangements for the past 2 months: Single Family Home Lives with:: Self Patient language and need for interpreter reviewed::  Yes Do you feel safe going back to the place where you live?: Yes      Need for Family Participation in Patient Care: Yes (Comment) Care giver support system in place?: Yes (comment) Current home services: DME Criminal Activity/Legal Involvement Pertinent to Current Situation/Hospitalization: No - Comment as needed  Activities of Daily Living      Permission Sought/Granted                  Emotional Assessment Appearance:: Appears stated age Attitude/Demeanor/Rapport: Engaged Affect (typically observed): Accepting, Appropriate Orientation: : Oriented to Self, Oriented to Place, Oriented to  Time, Oriented to Situation Alcohol / Substance Use: Not Applicable Psych Involvement: No (comment)  Admission diagnosis:  Dehydration [E86.0] SOB (shortness of breath) [R06.02] Nausea & vomiting [R11.2] Elevated troponin [R77.8] AKI (acute kidney injury) ([place]) [N17.9] Difficult intravenous access [Z78.9] Renal transplant recipient [Z94.0] Diarrhea, unspecified type [R19.7] Nausea and vomiting, intractability of vomiting not specified, unspecified vomiting type [R11.2] Patient Active Problem List   Diagnosis Date Noted   Nausea & vomiting 04/19/2021   Acute kidney injury ([place]) 04/19/2021   Viral syndrome 03/21/2021   Pure hypercholesterolemia    Coronary stent restenosis due to scar tissue  Unstable angina ([place]) 02/19/2021   Acute on chronic diastolic CHF (congestive heart failure) ([place]) 01/29/2021   Acute CHF (congestive heart failure) ([place]) 01/29/2021   Acute on chronic systolic CHF (congestive heart failure) ([place]) 01/29/2021   Non-ST elevation (NSTEMI) myocardial infarction [facility])    Chronic respiratory failure with hypoxia ([place]) 01/01/2021   Flu vaccine need 08/13/2020   Need for pneumococcal vaccination 08/13/2020   Visit for screening mammogram 08/13/2020   Candidal skin infection 08/13/2020   GAD (generalized anxiety disorder) 08/13/2020   Chronic bronchitis,  mucopurulent ([place]) 08/24/2019   Current chronic use of systemic steroids 07/23/2019   OSA (obstructive sleep apnea)    Severe episode of recurrent major depressive disorder, with psychotic features ([place]) 02/16/2019   Obstructive chronic bronchitis without exacerbation ([place]) 11/03/2018   Long term (current) use of anticoagulants 04/28/2018   Renal transplant, status post 01/28/2018   LBBB (left bundle branch block) 10/07/2017   Primary osteoarthritis of right knee 08/18/2017   Shortness of breath 06/18/2017   Mild aortic stenosis by prior echocardiogram 01/28/2017   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   Deficiency anemia 10/14/2016   Normocytic anemia 10/13/2016   Fibromyalgia 03/30/2016   Other spondylosis with radiculopathy, lumbar region 03/30/2016   Type 2 diabetes mellitus with peripheral neuropathy ([place]) 03/30/2016   Paroxysmal atrial fibrillation ([place]); CHA2DS2VASc score F, HTN, CAD, CVA = 5 06/28/2014   Hypertension associated with diabetes ([place])    Chest pain 08/23/2013   PAD (peripheral artery disease) ([place]) 08/17/2013   Stenosis of right carotid artery without infarction 10/08/2012   Dyslipidemia, goal LDL below 70 10/08/2012    Class: Diagnosis of   Mitral annular calcification 10/08/2012   Hypomagnesemia 06/13/2012   Renal transplant disorder 06/12/2012   Chronic allergic rhinitis 04/29/2011   Extrinsic asthma 09/09/2007   GERD 09/09/2007   COUGH, CHRONIC 09/09/2007   Morbid obesity - s/p Lap Band 9/'05 05/07/2004    Class: Diagnosis of   Hx of CABG 09/07/1993    Class: History of   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 07/1993   CAD in native artery 07/1993   ESRD (end stage renal disease) ([place]) 1991   PCP:  [name], MD Pharmacy:   [id number] - [place], [place] [place] [address] Phone: [phone] Fax: [phone]  [place], [place] [address] [place] [place] [address] Phone: [phone] Fax: [phone]     Social Determinants of Health (SDOH) Interventions    Readmission Risk Interventions Readmission Risk Prevention Plan 02/21/2021 01/31/2021 12/28/2019  Transportation Screening Complete Complete Complete  HRI or Home Care Consult - Complete Complete  Social Work Consult for [place] Planning/Counseling - Complete Complete  Palliative Care Screening - Not Applicable Not Applicable  Medication Review Press photographer) Complete Complete Complete  PCP or Specialist appointment within 3-5 days of discharge Complete - -  [place] or Home Care Consult Complete - -  SW Recovery Care/Counseling Consult Complete - -  Palliative Care Screening Not Applicable - -  [place] Not Applicable - -  Some recent data might be hidden

## 2021-04-22 NOTE — Plan of Care (Signed)
  Problem: Education: Goal: Knowledge of General Education information will improve Description: Including pain rating scale, medication(s)/side effects and non-pharmacologic comfort measures Outcome: Adequate for Discharge   Problem: Health Behavior/Discharge Planning: Goal: Ability to manage health-related needs will improve Outcome: Adequate for Discharge   Problem: Clinical Measurements: Goal: Ability to maintain clinical measurements within normal limits will improve Outcome: Adequate for Discharge Goal: Diagnostic test results will improve Outcome: Adequate for Discharge Goal: Cardiovascular complication will be avoided Outcome: Adequate for Discharge   Problem: Pain Managment: Goal: General experience of comfort will improve Outcome: Adequate for Discharge   Problem: Safety: Goal: Ability to remain free from injury will improve Outcome: Adequate for Discharge   Problem: Skin Integrity: Goal: Risk for impaired skin integrity will decrease Outcome: Adequate for Discharge   Problem: Education: Goal: Ability to demonstrate management of disease process will improve Outcome: Adequate for Discharge Goal: Ability to verbalize understanding of medication therapies will improve Outcome: Adequate for Discharge Goal: Individualized Educational Video(s) Outcome: Adequate for Discharge   Problem: Activity: Goal: Capacity to carry out activities will improve Outcome: Adequate for Discharge   Problem: Cardiac: Goal: Ability to achieve and maintain adequate cardiopulmonary perfusion will improve Outcome: Adequate for Discharge

## 2021-04-22 NOTE — Discharge Instructions (Signed)

## 2021-04-22 NOTE — TOC Initial Note (Addendum)
Transition of Care [facility]) - Initial/Assessment Note    Patient Details  Name: Katherine [name] MRN: [id number] Date of Birth: [birth year]  Transition of Care [facility]) CM/SW Contact:    Zenon Mayo, RN Phone Number: 04/22/2021, 9:51 AM  Clinical Narrative:                 This NCM was notified by Silverio Lay CSW that patient is refusing SNF and wants to go home with [facility] , choice was offered and she wants HH with [facility].  NCM made referral to [facility] with [facility].  He is able to take referral.  Patient will be going home to brothers  address.  Whic is [address], [place].         Patient Goals and CMS Choice        Expected Discharge Plan and Services                                                Prior Living Arrangements/Services                       Activities of Daily Living      Permission Sought/Granted                  Emotional Assessment              Admission diagnosis:  Dehydration [E86.0] SOB (shortness of breath) [R06.02] Nausea & vomiting [R11.2] Elevated troponin [R77.8] AKI (acute kidney injury) ([place]) [N17.9] Difficult intravenous access [Z78.9] Renal transplant recipient [Z94.0] Diarrhea, unspecified type [R19.7] Nausea and vomiting, intractability of vomiting not specified, unspecified vomiting type [R11.2] Patient Active Problem List   Diagnosis Date Noted   Nausea & vomiting 04/19/2021   Acute kidney injury ([place]) 04/19/2021   Viral syndrome 03/21/2021   Pure hypercholesterolemia    Coronary stent restenosis due to scar tissue    Unstable angina ([place]) 02/19/2021   Acute on chronic diastolic CHF (congestive heart failure) ([place]) 01/29/2021   Acute CHF (congestive heart failure) ([place]) 01/29/2021   Acute on chronic systolic CHF (congestive heart failure) ([place]) 01/29/2021   Non-ST elevation (NSTEMI) myocardial infarction [facility])    Chronic respiratory failure with hypoxia ([place]) 01/01/2021   Flu vaccine need  08/13/2020   Need for pneumococcal vaccination 08/13/2020   Visit for screening mammogram 08/13/2020   Candidal skin infection 08/13/2020   GAD (generalized anxiety disorder) 08/13/2020   Chronic bronchitis, mucopurulent ([place]) 08/24/2019   Current chronic use of systemic steroids 07/23/2019   OSA (obstructive sleep apnea)    Severe episode of recurrent major depressive disorder, with psychotic features ([place]) 02/16/2019   Obstructive chronic bronchitis without exacerbation ([place]) 11/03/2018   Long term (current) use of anticoagulants 04/28/2018   Renal transplant, status post 01/28/2018   LBBB (left bundle branch block) 10/07/2017   Primary osteoarthritis of right knee 08/18/2017   Shortness of breath 06/18/2017   Mild aortic stenosis by prior echocardiogram 01/28/2017   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   Deficiency anemia 10/14/2016   Normocytic anemia 10/13/2016   Fibromyalgia 03/30/2016   Other spondylosis with radiculopathy, lumbar region 03/30/2016   Type 2 diabetes mellitus with peripheral neuropathy ([place]) 03/30/2016   Paroxysmal atrial fibrillation ([place]); CHA2DS2VASc score F, HTN, CAD, CVA = 5 06/28/2014   Hypertension associated with diabetes ([place])    Chest  pain 08/23/2013   PAD (peripheral artery disease) ([place]) 08/17/2013   Stenosis of right carotid artery without infarction 10/08/2012   Dyslipidemia, goal LDL below 70 10/08/2012    Class: Diagnosis of   Mitral annular calcification 10/08/2012   Hypomagnesemia 06/13/2012   Renal transplant disorder 06/12/2012   Chronic allergic rhinitis 04/29/2011   Extrinsic asthma 09/09/2007   GERD 09/09/2007   COUGH, CHRONIC 09/09/2007   Morbid obesity - s/p Lap Band 9/'05 05/07/2004    Class: Diagnosis of   Hx of CABG 09/07/1993    Class: History of   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 07/1993   CAD in native artery 07/1993   ESRD (end stage renal disease) ([place]) 1991   PCP:  [name],  MD Pharmacy:   [id number] - [place], [address] [place] [address] Phone: [phone] Fax: [phone]  [place], [address] [place] [address] Phone: [phone] Fax: [phone]     Social Determinants of Health (SDOH) Interventions    Readmission Risk Interventions Readmission Risk Prevention Plan 02/21/2021 01/31/2021 12/28/2019  Transportation Screening Complete Complete Complete  HRI or Home Care Consult - Complete Complete  Social Work Consult for [place] Planning/Counseling - Complete Complete  Palliative Care Screening - Not Applicable Not Applicable  Medication Review Press photographer) Complete Complete Complete  PCP or Specialist appointment within 3-5 days of discharge Complete - -  [place] or Home Care Consult Complete - -  SW Recovery Care/Counseling Consult Complete - -  Palliative Care Screening Not Applicable - -  [place] Not Applicable - -  Some recent data might be hidden

## 2021-04-22 NOTE — TOC Transition Note (Addendum)
Transition of Care [facility]) - CM/SW Discharge Note   Patient Details  Name: Katherine [name] MRN: [id number] Date of Birth: 13-Sep-1955  Transition of Care [facility]) CM/SW Contact:  Zenon Mayo, RN Phone Number: 04/22/2021, 10:14 AM   Clinical Narrative:    Patient is for dc today, she is going to her brothers home at [address] [place] , [place].  She is not sure who will be transporting her home today, will check back with her.  She has a walker and a cane at home.  She has no issues with medications.  She has home oxygen with [employer] at home 3 liters Patient states she may have a problem with getting her oxygen dme to her brothers home for her to use and she also needs clothes, her brother does not drive and also she will need clothes from her house. NCM informed Thedore Mins with  [employer] to see what they can do, they will deliver the oxygen e tanks to  to patient's room to take to her  brother house and the brother will coordinate picking up the concentrator from the patient's home to bring to his home. NCM gave Staff RN a cab voucher to assist patient to get to her brother's home when oxygen has been delivered to patient 's room.  NCM informed patient to call [employer] if she has any issues with the oxygen.    Final next level of care: Home w Home Health Services Barriers to Discharge: No Barriers Identified   Patient Goals and CMS Choice Patient states their goals for this hospitalization and ongoing recovery are:: return to brothers home CMS Medicare.gov Compare Post Acute Care list provided to:: Patient Choice offered to / list presented to : Patient  Discharge Placement                       Discharge Plan and Services     Post Acute Care Choice: Home Health            DME Agency: NA       HH Arranged: RN, Disease Management, PT, OT, Social Work [employer] Agency: [place] Date [facility] Agency Contacted: 04/22/21 Time [place]: 1014 Representative spoke with  at [place]: [place] ([place]) Interventions     Readmission Risk Interventions Readmission Risk Prevention Plan 02/21/2021 01/31/2021 12/28/2019  Transportation Screening Complete Complete Complete  HRI or Home Care Consult - Complete Complete  Social Work Consult for [place] Planning/Counseling - Complete Complete  Palliative Care Screening - Not Applicable Not Applicable  Medication Review Press photographer) Complete Complete Complete  PCP or Specialist appointment within 3-5 days of discharge Complete - -  [place] or Home Care Consult Complete - -  SW Recovery Care/Counseling Consult Complete - -  Palliative Care Screening Not Applicable - -  [place] Not Applicable - -  Some recent data might be hidden

## 2021-04-22 NOTE — Progress Notes (Signed)
Occupational Therapy Treatment Patient Details Name: Katherine [name] MRN: [id number] DOB: [birth year] Today's Date: 04/22/2021    History of present illness Pt is a 65 y.o. female who presented 04/19/21 with nausea, diarrhea, and emesis. Pt admitted with gastroenteritis. PMH: ESRD s/p renal transplant on chronic immunosuppression, CAD s/p CABG and multiple PCI most recently DES to SVG 14 February 2021, PAF on Eliquis, chronic diastolic CHF, COPD, chronic respiratory failure with hypoxia on 3 L supplemental O2 via [place] with exertion and sleep, insulin-dependent T2DM, HTN, HLD, depression/anxiety, chronic pain   OT comments  Pt. Preformed ADLs sitting eob and in supine to clean peri area. Pt. Was ed on energy conservation with adls. Acute ot to follow.   Follow Up Recommendations  SNF;Supervision/Assistance - 24 hour    Equipment Recommendations  3 in 1 bedside commode    Recommendations for Other Services Rehab consult    Precautions / Restrictions Precautions Precautions: Fall Precaution Comments: Pt reports h/o falls including 2 falls out of the bed Restrictions Weight Bearing Restrictions: No       Mobility Bed Mobility         Supine to sit: Supervision          Transfers Overall transfer level: Needs assistance   Transfers: Sit to/from Stand Sit to Stand: Min guard              Balance     Sitting balance-Leahy Scale: Good       Standing balance-Leahy Scale: Fair                             ADL either performed or assessed with clinical judgement   ADL Overall ADL's : Needs assistance/impaired Eating/Feeding: Independent   Grooming: Wash/dry hands;Wash/dry face;Brushing hair;Set up;Sitting   Upper Body Bathing: Set up;Sitting   Lower Body Bathing: Sit to/from stand;Bed level;Sitting/lateral leans;Minimal assistance   Upper Body Dressing : Minimal assistance;Sitting   Lower Body Dressing: Minimal assistance;Sit to/from stand   Toilet  Transfer: Min guard;RW;BSC   Toileting- Water quality scientist and Hygiene: Minimal assistance;Sit to/from stand       Functional mobility during ADLs: Min guard;Rolling walker General ADL Comments: Pt. ed on cross leg technique.     Vision   Vision Assessment?: No apparent visual deficits   Perception     Praxis      Cognition Arousal/Alertness: Awake/alert Behavior During Therapy: WFL for tasks assessed/performed Overall Cognitive Status: Within Functional Limits for tasks assessed                                          Exercises     Shoulder Instructions       General Comments      Pertinent Vitals/ Pain       Pain Assessment: 0-10 Pain Score: 4  Pain Location: all over Pain Intervention(s): Premedicated before session  Home Living                                          Prior Functioning/Environment              Frequency  Min 2X/week        Progress Toward Goals  OT Goals(current goals can now be found in  the care plan section)  Progress towards OT goals: Progressing toward goals  Acute Rehab OT Goals Patient Stated Goal: go home OT Goal Formulation: With patient Time For Goal Achievement: 05/04/21 Potential to Achieve Goals: Good ADL Goals Pt Will Perform Grooming: with min guard assist;standing Pt Will Perform Upper Body Bathing: with set-up;sitting Pt Will Perform Lower Body Bathing: with min guard assist;sit to/from stand Pt Will Perform Upper Body Dressing: with set-up;sitting Pt Will Perform Lower Body Dressing: with min guard assist;sit to/from stand Pt Will Transfer to Toilet: with min guard assist;ambulating;regular height toilet;bedside commode;grab bars Pt Will Perform Toileting - Clothing Manipulation and hygiene: with min guard assist;sit to/from stand  Plan Discharge plan remains appropriate    Co-evaluation                 AM-PAC OT "6 Clicks" Daily Activity     Outcome  Measure   Help from another person eating meals?: None Help from another person taking care of personal grooming?: A Little Help from another person toileting, which includes using toliet, bedpan, or urinal?: A Little Help from another person bathing (including washing, rinsing, drying)?: A Little Help from another person to put on and taking off regular upper body clothing?: A Little Help from another person to put on and taking off regular lower body clothing?: A Little 6 Click Score: 19    End of Session Equipment Utilized During Treatment: Gait belt;Rolling walker;Oxygen  OT Visit Diagnosis: Unsteadiness on feet (R26.81);Repeated falls (R29.6)   Activity Tolerance Patient tolerated treatment well   Patient Left in chair;with call bell/phone within reach;with chair alarm set   Nurse Communication  (ol therapy)        Time: 0102-7253 OT Time Calculation (min): 39 min  Charges: OT General Charges $OT Visit: 1 Visit  Reece Packer OT/L    [place] 04/22/2021, 12:30 PM

## 2021-04-22 NOTE — Discharge Summary (Signed)
Physician Discharge Summary  Katherine [name] AYT:[id number] DOB: 01-May-1956 DOA: 04/19/2021  PCP: [name], MD  Admit date: 04/19/2021 Discharge date: 04/22/2021  Admitted From: Home  Discharge disposition: Home PT  Recommendations for Outpatient Follow-Up:   Follow up with your primary care provider in one week.  Check CBC, BMP, magnesium in the next visit Follow-up with your cardiologist as outpatient.  Discharge Diagnosis:   Principal Problem:   Nausea & vomiting Active Problems:   Dyslipidemia, goal LDL below 70   Hypertension associated with diabetes ([place])   Paroxysmal atrial fibrillation ([place]); CHA2DS2VASc score F, HTN, CAD, CVA = 5   Type 2 diabetes mellitus with peripheral neuropathy ([place])   Renal transplant, status post   Acute kidney injury [facility])  Discharge Condition: Improved.  Diet recommendation: Low sodium, heart healthy.  Carbohydrate-modified.    Wound care: None.  Code status: DNR   History of Present Illness:   Patient is a is a 65 year old female with medical history significant for end-stage renal disease status post renal transplant on chronic immunosuppression, coronary disease status post CABG and multiple PCI most recently DES to SVG on February 14, 2021, paroxysmal atrial fibrillation on Eliquis, chronic diastolic CHF, COPD, chronic respiratory failure with hypoxia on 3 L/min supplemental oxygen via nasal cannula, insulin-dependent diabetes mellitus, hypertension, hyperlipidemia, depression, anxiety, chronic pain, presented to the hospital for nausea vomiting abdominal pain and diarrhea.  Unable to tolerate any p.o. intake.  In the ED, patient was on supplemental oxygen and creatinine was mildly elevated.  COVID test was negative.  Hospital Course:   Following conditions were addressed during hospitalization as listed below,  Intractable nausea, vomiting, diarrhea Improved.  Likely secondary to viral gastroenteritis.  CT scan of the abdomen  pelvis did not show any acute abnormality.  Patient was able to tolerate oral diet.  Acute kidney injury in the setting of end-stage renal disease status post renal transplant.   CT scan of the abdomen without acute findings.  At baseline at this time.  Continue azathioprine resumed from outpatient..  Patient will follow-up with nephrology as outpatient.  Dysuria.  Abnormal urinalysis.  Urine culture showing E. coli.  Patient also complained of dysuria, urgency.   We will continue Keflex on discharge to complete the course.   Paroxysmal atrial fibrillation  Continue Lopressor Eliquis.  She is on Tikosyn at home.  Coronary disease status post CABG and multiple PCI Mild elevation of troponin secondary to demand ischemia.  Continue Plavix Imdur and statins.  No active issues at this time.  Chronic diastolic congestive heart failure Last known LV ejection fraction of 50 to 60%.  Initially appeared to be volume depleted and received some IV fluids.  Will resume diuretics from home on discharge.  Insulin-dependent diabetes mellitus type 2  On insulin regimen at home.  Continue metformin and Amaryl from home.  Hypokalemia Improved after replacement.  Latest potassium of 3.6.  Depression  continue Cymbalta Lamictal and Zoloft.  We will continue to monitor    Fibromyalgia/ chronic pain syndrome.  continue Cymbalta and opiates.  Continue Flexeril.  Hyperlipidemia  continue rosuvastatin  Essential hypertension  Continue Imdur and metoprolol.  COPD with chronic respiratory failure with hypoxia. Patient is chronically on 3 L/min O2 nasal cannula at home Continue Brovana Pulmicort Singulair albuterol   Deconditioning, debility  Lives by herself at home.  Physical therapy saw the patient and recommended skilled nursing facility.  Patient is adamant about going home with home physical therapy.  I have explained to her regarding her physical needs but she is insistent on going home, home health  PT OT RN and social worker will be arranged.  Disposition.  At this time, patient is stable for disposition home with home health.  Patient will follow up with her primary care physician and cardiology as outpatient.  Medical Consultants:   None.  Procedures:    CT abdomen Subjective:   Today, patient was seen and examined at bedside.  Patient states that she has felt well.  No nausea vomiting diarrhea or abdominal pain.  Discharge Exam:   Vitals:   04/22/21 0828 04/22/21 0831  BP:    Pulse: 61   Resp: 18   Temp:    SpO2: 98% 98%   Vitals:   04/22/21 0415 04/22/21 0751 04/22/21 0828 04/22/21 0831  BP: 130/66 132/81    Pulse: 63 63 61   Resp: 20  18   Temp: 98.7 F (37.1 C) 98.1 F (36.7 C)    TempSrc: Oral Oral    SpO2: 96% 99% 98% 98%  Weight: 97 kg      General: Alert awake, not in obvious distress, obese HENT: pupils equally reacting to light,  No scleral pallor or icterus noted. Oral mucosa is moist.  Chest:  Clear breath sounds.  Diminished breath sounds bilaterally. No crackles or wheezes.  Mild left-sided chest wall tenderness on palpation. CVS: S1 &S2 heard. No murmur.  Regular rate and rhythm.  CABG scar noted. Abdomen: Soft, nontender, nondistended.  Bowel sounds are heard.   Extremities: No cyanosis, clubbing but trace edema.  Peripheral pulses are palpable. Psych: Alert, awake and oriented, normal mood CNS:  No cranial nerve deficits.  Power equal in all extremities.   Skin: Warm and dry.  No rashes noted.  The results of significant diagnostics from this hospitalization (including imaging, microbiology, ancillary and laboratory) are listed below for reference.     Diagnostic Studies:   DG Chest 2 View  Result Date: 04/19/2021 CLINICAL DATA:  Left-sided chest pain for 1 week. Shortness of breath. History of COPD. Patient fell 1 week ago. EXAM: CHEST - 2 VIEW COMPARISON:  [place] 04/12/2021.  CT 08/23/2019. FINDINGS: Stable mild cardiomegaly post median  sternotomy and CABG. There are mitral annular and aortic calcifications. The lungs appear clear. There is no pleural effusion or pneumothorax. No acute fractures are seen. Several sternotomy wires are chronically fractured. IMPRESSION: Stable chest.  No evidence of acute cardiopulmonary process. Electronically Signed   By: Richardean Sale M.D.   On: 04/19/2021 10:39   CT ABDOMEN PELVIS W CONTRAST  Result Date: 04/19/2021 CLINICAL DATA:  Abdominal pain and nausea. EXAM: CT ABDOMEN AND PELVIS WITH CONTRAST TECHNIQUE: Multidetector CT imaging of the abdomen and pelvis was performed using the standard protocol following bolus administration of intravenous contrast. CONTRAST:  52mL OMNIPAQUE IOHEXOL 350 MG/ML SOLN COMPARISON:  Noncontrast CT 07/28/2014 FINDINGS: Lower chest: Heterogeneous pulmonary parenchyma with areas of subpleural ground-glass opacity in the right lower lobe. Similar findings seen on 08/23/2019 chest CT. Mild cardiomegaly with coronary artery calcifications no pleural fluid. Hepatobiliary: Calcified granuloma in the right lobe of the liver. Diffusely decreased hepatic density typical of steatosis. 19 mm cyst abutting the porta hepatis. Additional low-density abutting the falciform ligament, possible focal fatty infiltration but nonspecific. No suspicious liver lesion. Cholecystectomy without biliary dilatation. Pancreas: Fatty atrophy.  No ductal dilatation or inflammation. Spleen: Normal in size.  Occasional calcified granuloma. Adrenals/Urinary Tract: Normal adrenal glands. Marked bilateral native  renal parenchymal atrophy. Calcifications at both renal hila may be nonobstructing stones or vascular. Left lower quadrant transplant kidney. No transplant hydronephrosis or perinephric edema. There are tiny cortical hypodensities in the transplant that likely represent cysts but are too small to characterize. Transplanted ureter is nondistended. Urinary bladder is partially distended. There is bladder  wall thickening about the dome. Similar wall thickening was seen on 2015 exam. Stomach/Bowel: Gastric band in place. Stomach is decompressed. No small bowel obstruction or inflammation. Mild sigmoid colonic diverticulosis without diverticulitis. Small volume of colonic stool. Normal appendix. Vascular/Lymphatic: Advanced aortic and branch atherosclerosis with calcified and noncalcified atheromatous plaque. No aneurysm or acute vascular finding. Chunky plaque at the iliac 1 bifurcations likely cause some degree of luminal stenosis, not well assessed on this non CTA exam. Patent portal vein. No enlarged lymph nodes in the abdomen or pelvis. Reproductive: Atrophic uterus.  No adnexal mass. Other: No free air, free fluid, or intra-abdominal fluid collection. Musculoskeletal: There are no acute or suspicious osseous abnormalities. Degenerative change throughout the spine. Bilateral hip osteoarthritis, moderately advanced on the left. Scattered bone islands. IMPRESSION: 1. No acute abnormality in the abdomen/pelvis. 2. Left lower quadrant transplant kidney without transplant hydronephrosis or perinephric edema. 3. Mild bladder wall thickening about the dome, similar to 2015 exam, and favored to be chronic. Recommend urinalysis to exclude urinary tract infection. 4. Hepatic steatosis. 5. Colonic diverticulosis without diverticulitis. 6. Heterogeneous pulmonary parenchyma in the lung bases suggestive of small vessel or small airways disease. Areas of subpleural ground-glass opacity in the right lower lobe are not significantly changed from January 2021 chest CT. Aortic Atherosclerosis (ICD10-I70.0). Electronically Signed   By: Keith Rake M.D.   On: 04/19/2021 15:25     Labs:   Basic Metabolic Panel: Recent Labs  Lab 04/19/21 0938 04/19/21 1305 04/20/21 0648 04/21/21 0130 04/22/21 0403  NA 140  --  137 136 139  K 3.4*  --  3.8 4.0 3.6  CL 108  --  109 103 107  CO2 20*  --  18* 25 27  GLUCOSE 190*  --   70 183* 140*  BUN 8  --  7* 7* 8  CREATININE 1.33*  --  1.13* 1.06* 1.06*  CALCIUM 9.1  --  8.4* 8.6* 8.7*  MG  --  1.6* 1.5* 1.5* 1.6*  PHOS  --   --   --   --  3.0   GFR Estimated Creatinine Clearance: 57.9 mL/min (A) (by C-G formula based on SCr of 1.06 mg/dL (H)). Liver Function Tests: Recent Labs  Lab 04/19/21 1305  AST 33  ALT 10  ALKPHOS 116  BILITOT 0.7  PROT 6.7  ALBUMIN 2.5*   Recent Labs  Lab 04/19/21 1305  LIPASE 17   No results for input(s): AMMONIA in the last 168 hours. Coagulation profile No results for input(s): INR, PROTIME in the last 168 hours.  CBC: Recent Labs  Lab 04/19/21 0938 04/20/21 0648 04/22/21 0403  WBC 9.0 5.5 6.6  HGB 11.8* 10.4* 9.6*  HCT 38.3 34.9* 29.8*  MCV 97.2 102.0* 94.9  PLT 363 274 241   Cardiac Enzymes: No results for input(s): CKTOTAL, CKMB, CKMBINDEX, TROPONINI in the last 168 hours. BNP: Invalid input(s): POCBNP CBG: Recent Labs  Lab 04/22/21 0020 04/22/21 0410 04/22/21 0800 04/22/21 1150 04/22/21 1545  GLUCAP 122* 133* 109* 246* 206*   D-Dimer No results for input(s): DDIMER in the last 72 hours. Hgb A1c No results for input(s): HGBA1C in the last  72 hours. Lipid Profile No results for input(s): CHOL, HDL, LDLCALC, TRIG, CHOLHDL, LDLDIRECT in the last 72 hours. Thyroid function studies No results for input(s): TSH, T4TOTAL, T3FREE, THYROIDAB in the last 72 hours.  Invalid input(s): FREET3 Anemia work up No results for input(s): VITAMINB12, FOLATE, FERRITIN, TIBC, IRON, RETICCTPCT in the last 72 hours. Microbiology Recent Results (from the past 240 hour(s))  Resp Panel by RT-PCR (Flu A&B, Covid) Nasopharyngeal Swab     Status: None   Collection Time: 04/19/21  1:05 PM   Specimen: Nasopharyngeal Swab; Nasopharyngeal(NP) swabs in vial transport medium  Result Value Ref Range Status   SARS Coronavirus 2 by RT PCR NEGATIVE NEGATIVE Final    Comment: (NOTE) SARS-CoV-2 target nucleic acids are NOT  DETECTED.  The SARS-CoV-2 RNA is generally detectable in upper respiratory specimens during the acute phase of infection. The lowest concentration of SARS-CoV-2 viral copies this assay can detect is 138 copies/mL. A negative result does not preclude SARS-Cov-2 infection and should not be used as the sole basis for treatment or other patient management decisions. A negative result may occur with  improper specimen collection/handling, submission of specimen other than nasopharyngeal swab, presence of viral mutation(s) within the areas targeted by this assay, and inadequate number of viral copies(<138 copies/mL). A negative result must be combined with clinical observations, patient history, and epidemiological information. The expected result is Negative.  Fact Sheet for Patients:  EntrepreneurPulse.com.au  Fact Sheet for Healthcare Providers:  IncredibleEmployment.be  This test is no t yet approved or cleared by the [place] FDA and  has been authorized for detection and/or diagnosis of SARS-CoV-2 by FDA under an Emergency Use Authorization (EUA). This EUA will remain  in effect (meaning this test can be used) for the duration of the COVID-19 declaration under Section 564(b)(1) of the Act, 21 U.S.C.section 360bbb-3(b)(1), unless the authorization is terminated  or revoked sooner.       Influenza A by PCR NEGATIVE NEGATIVE Final   Influenza B by PCR NEGATIVE NEGATIVE Final    Comment: (NOTE) The Xpert Xpress SARS-CoV-2/FLU/RSV plus assay is intended as an aid in the diagnosis of influenza from Nasopharyngeal swab specimens and should not be used as a sole basis for treatment. Nasal washings and aspirates are unacceptable for Xpert Xpress SARS-CoV-2/FLU/RSV testing.  Fact Sheet for Patients: EntrepreneurPulse.com.au  Fact Sheet for Healthcare Providers: IncredibleEmployment.be  This test is not yet  approved or cleared by the [place] FDA and has been authorized for detection and/or diagnosis of SARS-CoV-2 by FDA under an Emergency Use Authorization (EUA). This EUA will remain in effect (meaning this test can be used) for the duration of the COVID-19 declaration under Section 564(b)(1) of the Act, 21 U.S.C. section 360bbb-3(b)(1), unless the authorization is terminated or revoked.  Performed at [facility], [place] [address], [place], [place] [address]   Urine Culture     Status: Abnormal (Preliminary result)   Collection Time: 04/20/21 12:31 PM   Specimen: Urine, Clean Catch  Result Value Ref Range Status   Specimen Description URINE, CLEAN CATCH  Final   Special Requests Immunocompromised  Final   Culture (A)  Final    >=100,000 COLONIES/mL ESCHERICHIA COLI SUSCEPTIBILITIES TO FOLLOW Performed at [facility], [place] [address] [place], [place] [address]    Report Status PENDING  Incomplete     Discharge Instructions:   Discharge Instructions     Call MD for:  persistant nausea and vomiting   Complete  by: As directed    Call MD for:  severe uncontrolled pain   Complete by: As directed    Call MD for:  temperature >100.4   Complete by: As directed    Diet - low sodium heart healthy   Complete by: As directed    Diet Carb Modified   Complete by: As directed    Discharge instructions   Complete by: As directed    Follow-up with your primary care physician in 1 week.  Check blood work at that time.  Follow-up with your cardiologist as scheduled by you.   Increase activity slowly   Complete by: As directed       Allergies as of 04/22/2021       Reactions   Tetracycline Hives   Patient tolerated Doxycycline Dec 2020   Niacin Other (See Comments)   Mouth blisters   Niaspan [niacin Er] Other (See Comments)   Mouth blisters   Sulfa Antibiotics Nausea Only, Other (See Comments)   "Tears up stomach"   Sulfonamide Derivatives Other (See Comments)    Reaction: per patient "tears her stomach up"   Codeine Nausea And Vomiting   Erythromycin Nausea And Vomiting   Hydromorphone Hcl Nausea And Vomiting   Morphine And Related Nausea And Vomiting   Nalbuphine Nausea And Vomiting   Nubain   Sulfasalazine Nausea Only, Other (See Comments)   per patient "tears her stomach up", "Tears up stomach"   Tape Rash, Other (See Comments)   No "plastic" tape," please----cloth tape only        Medication List     TAKE these medications    acetaminophen 500 MG tablet Commonly known as: TYLENOL Take 1,500 mg by mouth 2 (two) times daily.   albuterol 108 (90 Base) MCG/ACT inhaler Commonly known as: VENTOLIN HFA TAKE 2 PUFFS BY MOUTH EVERY 6 HOURS AS NEEDED FOR WHEEZE OR SHORTNESS OF BREATH What changed: See the new instructions.   apixaban 5 MG Tabs tablet Commonly known as: Eliquis Take 1 tablet (5 mg total) by mouth 2 (two) times daily. APPOINTMENT NEEDED WITH CARDIOLOGIST FOR FURTHER REFILLS   arformoterol 15 MCG/2ML Nebu Commonly known as: BROVANA Take 2 mLs (15 mcg total) by nebulization 2 (two) times daily.   azaTHIOprine 50 MG tablet Commonly known as: IMURAN Take 125 mg by mouth See admin instructions. Take 2 1/2 tablets (125 mg) by mouth daily at 3pm   Basaglar KwikPen 100 UNIT/ML Inject 15 Units into the skin daily before breakfast.   budesonide 0.5 MG/2ML nebulizer solution Commonly known as: PULMICORT Take 2 mLs (0.5 mg total) by nebulization 2 (two) times daily.   calcitRIOL 0.25 MCG capsule Commonly known as: ROCALTROL Take 0.25 mcg by mouth every 3 (three) days.   cephALEXin 250 MG capsule Commonly known as: KEFLEX Take 1 capsule (250 mg total) by mouth 3 (three) times daily for 3 days.   clopidogrel 75 MG tablet Commonly known as: PLAVIX Take 1 tablet (75 mg total) by mouth daily.   clotrimazole 10 MG troche Commonly known as: MYCELEX Take 1 tablet (10 mg total) by mouth 5 (five) times daily as needed  (thrush).   cyclobenzaprine 10 MG tablet Commonly known as: FLEXERIL TAKE 2 TAB DAILY AT BEDTIME, MAY ALSO TAKE 1 TABLET BY MOUTH AT NOON AS NEEDED FOR MUSCLE SPASMS What changed: See the new instructions.   diclofenac Sodium 1 % Gel Commonly known as: VOLTAREN Apply 2 g topically 4 (four) times daily.   diltiazem 30  MG tablet Commonly known as: Cardizem take 1 tablet every 4 hours AS NEEDED for afib heart rate >100 as long as top blood pressure >100. What changed:  how much to take how to take this when to take this additional instructions   dofetilide 250 MCG capsule Commonly known as: TIKOSYN Take 1 capsule (250 mcg total) by mouth 2 (two) times daily.   DULoxetine 30 MG capsule Commonly known as: CYMBALTA TAKE 2 CAPSULES (60 MG TOTAL) BY MOUTH EVERY MORNING. What changed: See the new instructions.   fluticasone 50 MCG/ACT nasal spray Commonly known as: FLONASE PLACE 2 SPRAYS INTO BOTH NOSTRILS 2 TIMES DAILY What changed:  how much to take how to take this when to take this additional instructions   furosemide 40 MG tablet Commonly known as: LASIX Take 1 tablet (40 mg total) by mouth daily.   glimepiride 4 MG tablet Commonly known as: AMARYL Take 4 mg by mouth daily.   isosorbide mononitrate 60 MG 24 hr tablet Commonly known as: IMDUR Take 1 tablet (60 mg total) by mouth daily.   lamoTRIgine 25 MG tablet Commonly known as: LAMICTAL TAKE 1 TABLET BY MOUTH EVERYDAY AT BEDTIME What changed: See the new instructions.   LORazepam 1 MG tablet Commonly known as: ATIVAN Take 1 tablet (1 mg total) by mouth 2 (two) times daily.   magnesium oxide 400 (240 Mg) MG tablet Commonly known as: MAG-OX Take 1 tablet (400 mg total) by mouth daily. Start taking on: April 23, 2021   metFORMIN 500 MG tablet Commonly known as: GLUCOPHAGE Take 1 tablet (500 mg total) by mouth 2 (two) times daily.   metoprolol tartrate 100 MG tablet Commonly known as: LOPRESSOR Take  1 tablet (100 mg total) by mouth 2 (two) times daily. Appointment Required For Further Refills [phone] What changed: how much to take   montelukast 10 MG tablet Commonly known as: SINGULAIR TAKE 1 TABLET BY MOUTH EVERYDAY AT BEDTIME What changed: See the new instructions.   nitroGLYCERIN 0.4 MG SL tablet Commonly known as: NITROSTAT Place 1 tablet (0.4 mg total) under the tongue every 5 (five) minutes as needed for chest pain.   NovoLOG FlexPen 100 UNIT/ML FlexPen Generic drug: insulin aspart Inject 14 Units into the skin daily.   Oxycodone HCl 10 MG Tabs Take 1 tablet (10 mg total) by mouth 4 (four) times daily as needed.   OXYGEN Inhale 3 L into the lungs continuous.   pantoprazole 40 MG tablet Commonly known as: Protonix Take 1 tablet (40 mg total) by mouth daily.   phenazopyridine 100 MG tablet Commonly known as: PYRIDIUM Take 1 tablet (100 mg total) by mouth 3 (three) times daily with meals.   predniSONE 5 MG tablet Commonly known as: DELTASONE Take 5 mg by mouth every morning.   promethazine 25 MG tablet Commonly known as: PHENERGAN Take 1 tablet (25 mg total) by mouth at bedtime as needed for nausea. For nausea and sleep   rosuvastatin 40 MG tablet Commonly known as: CRESTOR Take 1 tablet (40 mg total) by mouth daily. What changed: how much to take   sertraline 100 MG tablet Commonly known as: ZOLOFT Take 100 mg by mouth every morning.        Follow-up Information     [name], MD. Go on 04/25/2021.   Specialty: Internal Medicine Why: for regular followup@2 :20pm with Dr.Burns Contact information: [place] [address] [phone]         [name]  Viona Gilmore, MD .   Specialty: Cardiology Contact information: [address] [place] [address] [place], [facility] Follow up.   Specialty: Home Health Services Why: HHRN,HHPT,HHOT, Social Work Contact information: [place] [address] [phone]                  Time coordinating discharge: 39 minutes  Signed:  Carletta Feasel  [facility] 04/22/2021, 3:59 PM

## 2021-04-23 LAB — URINE CULTURE: Culture: >=100000 — AB

## 2021-04-24 ENCOUNTER — Telehealth: Payer: Self-pay

## 2021-04-24 NOTE — Telephone Encounter (Signed)
Transition Care Management Unsuccessful Follow-up Telephone Call  Date of discharge and from where:  04/22/2021 from [facility]  Attempts:  1st Attempt  Reason for unsuccessful TCM follow-up call:  Spoke with patient; she does not want to schedule hospital follow up with any other doctor.  Dr. Ronnald Ramp schedule is completely booked for the next 3 weeks.  Patient stated that she will call back sometime next week because she is to weak to talk on the phone or come in.

## 2021-04-24 NOTE — Telephone Encounter (Signed)
Per Dr. Ronnald Ramp nurse schedule patient for either 9:20 a or 2:20 pm next week.

## 2021-04-25 ENCOUNTER — Ambulatory Visit: Payer: Medicare Other | Admitting: Internal Medicine

## 2021-04-25 NOTE — Telephone Encounter (Signed)
Patient is limited in her availability to come in next week.  Wants to have an appointment on Thursday, if possible.  Schedule is full for 9:20 and 2:20 on this date

## 2021-04-28 ENCOUNTER — Telehealth: Payer: Self-pay

## 2021-04-28 DIAGNOSIS — M797 Fibromyalgia: Secondary | ICD-10-CM

## 2021-04-28 MED ORDER — OXYCODONE HCL 10 MG PO TABS: 10.0000 mg | ORAL_TABLET | Freq: Four times a day (QID) | ORAL | 0 refills | Status: DC | PRN

## 2021-04-28 NOTE — Telephone Encounter (Signed)
Refill request for Oxycodone. Can you address this in Dr. Naaman Plummer absence.

## 2021-04-28 NOTE — Telephone Encounter (Signed)
Katherine [name] has on # 2 Oxycodone 10 MG, on hand.  In PMP the last script was sent in on 03/28/2021 for #95. Please advise. Thank you.  Call back phone [phone].

## 2021-04-28 NOTE — Addendum Note (Signed)
Addended by: Bayard Hugger on: 04/28/2021 04:42 PM   Modules accepted: Orders

## 2021-04-28 NOTE — Telephone Encounter (Signed)
Dr Naaman Plummer note was reviewed.  [name] was admitted to hospital twice since last visit. Discharge Summary from 04/22/2021 was reviewed. Oxycodone e-scribed today.  Placed a call to [name], no answer. Left message for [name] to return the call.

## 2021-04-29 DIAGNOSIS — C4490 Unspecified malignant neoplasm of skin, unspecified: Secondary | ICD-10-CM | POA: Diagnosis not present

## 2021-04-29 DIAGNOSIS — R197 Diarrhea, unspecified: Secondary | ICD-10-CM | POA: Diagnosis not present

## 2021-04-29 DIAGNOSIS — R413 Other amnesia: Secondary | ICD-10-CM | POA: Diagnosis not present

## 2021-04-29 DIAGNOSIS — D631 Anemia in chronic kidney disease: Secondary | ICD-10-CM | POA: Diagnosis not present

## 2021-04-29 DIAGNOSIS — N182 Chronic kidney disease, stage 2 (mild): Secondary | ICD-10-CM | POA: Diagnosis not present

## 2021-04-29 DIAGNOSIS — E118 Type 2 diabetes mellitus with unspecified complications: Secondary | ICD-10-CM | POA: Diagnosis not present

## 2021-04-29 DIAGNOSIS — I129 Hypertensive chronic kidney disease with stage 1 through stage 4 chronic kidney disease, or unspecified chronic kidney disease: Secondary | ICD-10-CM | POA: Diagnosis not present

## 2021-04-29 DIAGNOSIS — Z940 Kidney transplant status: Secondary | ICD-10-CM | POA: Diagnosis not present

## 2021-04-30 ENCOUNTER — Encounter: Payer: Self-pay | Admitting: Neurology

## 2021-04-30 ENCOUNTER — Ambulatory Visit: Payer: Medicare Other | Admitting: Neurology

## 2021-05-01 ENCOUNTER — Telehealth: Payer: Self-pay

## 2021-05-01 NOTE — Chronic Care Management (AMB) (Signed)
Chronic Care Management Pharmacy Assistant   Name: Katherine [name]  MRN: [id number] DOB: 08/14/1956  Katherine [name] is an 65 y.o. year old female who presents for her initial CCM visit with the clinical pharmacist.  Recent office visits:  03/25/21 Video Colin Benton Do- pt was seen for cough no labs were ordered and there were no med changes. No follow up noted.  Recent consult visits:  03/12/21 Tawni Levy MD Physical Med- pt was seen for Type 2 DM. Labs were ordered and referral for home health was placed. Pt started on Amox. 250 mg TID. Virtual follow up in 2 months  01/01/21 Telephone Baltazar Apo MD Pulm.- Pt seen for chronic allergic rhinitis. There were no labs or med changes. Follow up in 6 months or prn.  12/25/20 Danella Sensing NP- Pt seen for Spondylosis of lumbar region. No med changes or labs were noted. Follow up 1 month.  11/20/20 Video Baltazar Apo MD Pulm.- pt was seen for Mild persistent extrinsic asthma.Pt advised stop the pred taper and go back to 5mg  daily and finish the doxycycline.  Follow up may 4th 2022.  Hospital visits:  1.Medication Reconciliation was completed by comparing discharge summary, patient's EMR and Pharmacy list.  Admitted to the hospital on 04/19/21 due to Nausea/Vomiting. Discharge date was 04/22/21. Discharged from [place]?Medications Started at [facility] Discharge:?? -started Magnesium oxide  Medication Changes at Hospital Discharge: -Changed none  Medications Discontinued at Hospital Discharge: -Stopped none  Medications that remain the same after Hospital Discharge:??  -All other medications will remain the same.    2.Medication Reconciliation was completed by comparing discharge summary, patient's EMR and Pharmacy list.  Admitted to the hospital on 04/12/21 due to Fall. Discharge date was not noted. Discharged from [place]?Medications Started at [facility] Discharge:?? -started  none  Medication Changes at Hospital Discharge: -Changed none  Medications Discontinued at Hospital Discharge: -Stopped none  Medications that remain the same after Hospital Discharge:??  -All other medications will remain the same.     3.Medication Reconciliation was completed by comparing discharge summary, patient's EMR and Pharmacy list.  Admitted to the hospital on 03/21/21 due to Chest pain. Discharge date was 03/22/21. Discharged from [place]?Medications Started at [facility] Discharge:?? -started diclofenac Sodium (VOLTAREN)  Medication Changes at Hospital Discharge: -Changed albuterol (VENTOLIN HFA) cyclobenzaprine (FLEXERIL) diltiazem (Cardizem) DULoxetine (CYMBALTA) Eliquis (apixaban) fluticasone (FLONASE) lamoTRIgine (LAMICTAL) metoprolol tartrate (LOPRESSOR) montelukast (SINGULAIR)  Medications Discontinued at Hospital Discharge: -Stopped none  Medications that remain the same after Hospital Discharge:??  -All other medications will remain the same.    4.Medication Reconciliation was completed by comparing discharge summary, patient's EMR and Pharmacy list.  Admitted to the hospital on 02/19/21/22 due to Unstable Chest pain. Discharge date was 7/822. Discharged from [place]?Medications Started at [facility] Discharge:?? -started clopidogrel (PLAVIX)  Medication Changes at Hospital Discharge: -Changed albuterol (VENTOLIN HFA) cyclobenzaprine (FLEXERIL) DULoxetine (CYMBALTA) Eliquis (apixaban) fluticasone (FLONASE) lamoTRIgine (LAMICTAL) metoprolol tartrate (LOPRESSOR) montelukast (SINGULAIR) Oxycodone HCl promethazine (PHENERGAN)  Medications Discontinued at Hospital Discharge: -Stopped Aspirin and Omeprazole  Medications that remain the same after Hospital Discharge:??  -All other medications will remain the same.    5.Medication Reconciliation was completed by comparing discharge summary, patient's EMR and  Pharmacy list.  Admitted to the hospital on 01/28/21 due to Acute CHF. Discharge date was 02/03/21. Discharged from [facility].  New?Medications Started at [facility] Discharge:?? -started Aspirin low dose and isosorbide mononitrate (IMDUR)  Medication Changes at Hospital Discharge: -Changed albuterol (VENTOLIN HFA) arformoterol (BROVANA) budesonide (PULMICORT) cyclobenzaprine (FLEXERIL) DULoxetine (CYMBALTA) Eliquis (apixaban) fluticasone (FLONASE) lamoTRIgine (LAMICTAL) metoprolol tartrate (LOPRESSOR) Oxycodone HCl rosuvastatin (CRESTOR)  Medications Discontinued at Hospital Discharge: -Stopped diltiazem 120 MG 24 hr capsule (CARDIZEM CD) diltiazem 30 MG tablet (CARDIZEM) doxycycline 100 MG tablet (VIBRA-TABS)  Medications that remain the same after Hospital Discharge:??  -All other medications will remain the same.   Medications: Outpatient Encounter Medications as of 05/01/2021  Medication Sig Note   acetaminophen (TYLENOL) 500 MG tablet Take 1,500 mg by mouth 2 (two) times daily.    albuterol (VENTOLIN HFA) 108 (90 Base) MCG/ACT inhaler TAKE 2 PUFFS BY MOUTH EVERY 6 HOURS AS NEEDED FOR WHEEZE OR SHORTNESS OF BREATH (Patient taking differently: Inhale 2 puffs into the lungs every 6 (six) hours as needed for wheezing or shortness of breath.) 03/21/2021: LF 02/10/2021 (25DS) [facility]   apixaban (ELIQUIS) 5 MG TABS tablet Take 1 tablet (5 mg total) by mouth 2 (two) times daily. APPOINTMENT NEEDED WITH CARDIOLOGIST FOR FURTHER REFILLS    arformoterol (BROVANA) 15 MCG/2ML NEBU Take 2 mLs (15 mcg total) by nebulization 2 (two) times daily. (Patient not taking: Reported on 04/19/2021)    azaTHIOprine (IMURAN) 50 MG tablet Take 125 mg by mouth See admin instructions. Take 2 1/2 tablets (125 mg) by mouth daily at 3pm 03/21/2021: #75/30 days filled 03/15/2021 [facility]   budesonide (PULMICORT) 0.5 MG/2ML nebulizer solution Take 2 mLs (0.5  mg total) by nebulization 2 (two) times daily. (Patient not taking: Reported on 04/19/2021)    calcitRIOL (ROCALTROL) 0.25 MCG capsule Take 0.25 mcg by mouth every 3 (three) days.  03/21/2021: #45/90 days filled 02/03/2021 [facility]   clopidogrel (PLAVIX) 75 MG tablet Take 1 tablet (75 mg total) by mouth daily. 03/21/2021: #90 filled 02/21/2021 [facility]   clotrimazole (MYCELEX) 10 MG troche Take 1 tablet (10 mg total) by mouth 5 (five) times daily as needed (thrush). 03/21/2021: #40/10 days filed 02/18/2021 [facility]   cyclobenzaprine (FLEXERIL) 10 MG tablet TAKE 2 TAB DAILY AT BEDTIME, MAY ALSO TAKE 1 TABLET BY MOUTH AT NOON AS NEEDED FOR MUSCLE SPASMS (Patient taking differently: Take 10 mg by mouth See admin instructions. TAKE 2 TAB DAILY AT BEDTIME, MAY ALSO TAKE 1 TABLET BY MOUTH AT NOON AS NEEDED FOR MUSCLE SPASMS)    diclofenac Sodium (VOLTAREN) 1 % GEL Apply 2 g topically 4 (four) times daily. (Patient not taking: Reported on 04/19/2021)    diltiazem (CARDIZEM) 30 MG tablet take 1 tablet every 4 hours AS NEEDED for afib heart rate >100 as long as top blood pressure >100. (Patient taking differently: Take 30 mg by mouth See admin instructions. take 1 tablet (30 mg) every 4 hours AS NEEDED for afib heart rate >100 as long as top blood pressure >100.) 03/21/2021: #30 filled 03/04/2021 [facility]   dofetilide (TIKOSYN) 250 MCG capsule Take 1 capsule (250 mcg total) by mouth 2 (two) times daily. 03/21/2021: #180/90 days filled 01/14/2021 [facility]   DULoxetine (CYMBALTA) 30 MG capsule TAKE 2 CAPSULES (60 MG TOTAL) BY MOUTH EVERY MORNING. (Patient taking differently: Take 60 mg by mouth every morning.) 03/21/2021: #180/90 days filled 01/26/2021 [facility]   fluticasone (FLONASE) 50 MCG/ACT nasal spray PLACE  2 SPRAYS INTO BOTH NOSTRILS 2 TIMES DAILY (Patient taking differently: Place 2  sprays into both nostrils 2 (two) times daily.) 03/21/2021: LF 03/19/2021 (90DS) [facility]   furosemide (LASIX) 40 MG tablet Take 1 tablet (40 mg total) by mouth daily. 03/21/2021: #90 filled [facility]/13/2021 [facility]   glimepiride (AMARYL) 4 MG tablet Take 4 mg by mouth daily. 03/21/2021: #90 filled [facility]/26/2021 [facility]   insulin aspart (NOVOLOG FLEXPEN) 100 UNIT/ML FlexPen Inject 14 Units into the skin daily. 03/21/2021: LF 03/04/2021 (107 DS) [facility]   Insulin Glargine (BASAGLAR KWIKPEN) 100 UNIT/ML SOPN Inject 15 Units into the skin daily before breakfast. 03/21/2021: LF 02/25/2021 (100 DS) [facility]   isosorbide mononitrate (IMDUR) 60 MG 24 hr tablet Take 1 tablet (60 mg total) by mouth daily.    lamoTRIgine (LAMICTAL) 25 MG tablet TAKE 1 TABLET BY MOUTH EVERYDAY AT BEDTIME (Patient taking differently: Take 25 mg by mouth at bedtime.) 03/21/2021: #90 filled 03/05/2021 [facility]   LORazepam (ATIVAN) 1 MG tablet Take 1 tablet (1 mg total) by mouth 2 (two) times daily. 03/21/2021: #60/30 days filled [facility]/20/[facility]21 [facility]   magnesium oxide (MAG-OX) 400 (240 Mg) MG tablet Take 1 tablet (400 mg total) by mouth daily.    metFORMIN (GLUCOPHAGE) 500 MG tablet Take 1 tablet (500 mg total) by mouth 2 (two) times daily. 03/21/2021: #180/90 days filled 02/18/2021 [facility]   metoprolol tartrate (LOPRESSOR) 100 MG tablet Take 1 tablet (100 mg total) by mouth 2 (two) times daily. Appointment Required For Further Refills [phone] (Patient taking differently: Take 50 mg by mouth 2 (two) times daily. Appointment Required For Further Refills [phone])    montelukast (SINGULAIR) 10 MG tablet TAKE 1 TABLET BY MOUTH EVERYDAY AT BEDTIME (Patient taking differently: Take 10 mg by mouth at bedtime.) 03/21/2021: #90 filled [facility]10/2021 [facility]    nitroGLYCERIN (NITROSTAT) 0.4 MG SL tablet Place 1 tablet (0.4 mg total) under the tongue every 5 (five) minutes as needed for chest pain. 03/21/2021: #25 filled 03/03/2021 [facility]   Oxycodone HCl 10 MG TABS Take 1 tablet (10 mg total) by mouth 4 (four) times daily as needed.    OXYGEN Inhale 3 L into the lungs continuous.    pantoprazole (PROTONIX) 40 MG tablet Take 1 tablet (40 mg total) by mouth daily. 03/21/2021: #30 filled [facility]/17/2021 [facility]   phenazopyridine (PYRIDIUM) 100 MG tablet Take 1 tablet (100 mg total) by mouth 3 (three) times daily with meals.    predniSONE (DELTASONE) 5 MG tablet Take 5 mg by mouth every morning. 03/21/2021: Continuous - #90 filled 03/09/2021 [facility]   promethazine (PHENERGAN) 25 MG tablet Take 1 tablet (25 mg total) by mouth at bedtime as needed for nausea. For nausea and sleep 03/21/2021: #120/30 days filled 03/17/2021 [facility]   rosuvastatin (CRESTOR) 40 MG tablet Take 1 tablet (40 mg total) by mouth daily. (Patient taking differently: Take 20 mg by mouth daily.) 03/21/2021: #30 filled 02/03/2021  Transitions of Care per external pharmacy records   sertraline (ZOLOFT) 100 MG tablet Take 100 mg by mouth every morning. 03/21/2021: #180/90 days filled 03/09/2021 [facility]   No facility-administered encounter medications on file as of 05/01/2021.    Current Medication List acetaminophen (TYLENOL) 500  MG tablet last filled albuterol (VENTOLIN HFA) 108 (90 Base) MCG/ACT last filled 02/10/21 25 DS apixaban (ELIQUIS) 5 MG TABS tablet last filled 04/12/21 30 DS arformoterol (BROVANA) 15 MCG/2ML NEBU azaTHIOprine (IMURAN) 50 MG tablet calcitRIOL (ROCALTROL) 0.25 MCG capsule last filled 04/30/21 90 DS clopidogrel (PLAVIX) 75 MG tablet last filled 02/21/21 90 DS clotrimazole (MYCELEX) 10 MG troche last filled 03/28/21 10 DS cyclobenzaprine (FLEXERIL) 10 MG  tablet last filled 04/28/21 30 DS diclofenac Sodium (VOLTAREN) 1 % GEL last filled 03/22/21 6 DS diltiazem (CARDIZEM) 30 MG tablet last filled 03/04/21 7 DS dofetilide (TIKOSYN) 250 MCG capsule last filled 01/14/21 90 DS DULoxetine (CYMBALTA) 30 MG capsule last filled 04/30/21 90 DS fluticasone (FLONASE) 50 MCG/ACT nasal spray last filled 03/19/21 90 DS furosemide (LASIX) 40 MG tablet last filled 02/13/21 90 DS glimepiride (AMARYL) 4 MG tablet last filled 02/26/21 90 DS insulin aspart (NOVOLOG FLEXPEN) 100 UNIT/ML FlexPen last filled 03/04/21 107 DS Insulin Glargine (BASAGLAR KWIKPEN) 100 UNIT/ML SOPN last filled 02/25/21 100 DS isosorbide mononitrate (IMDUR) 60 MG 24 hr tablet last filled 04/16/21 30 DS lamoTRIgine (LAMICTAL) 25 MG tablet last filled 03/05/21 LORazepam (ATIVAN) 1 MG tablet last filled 04/21/21 30 DS magnesium oxide (MAG-OX) 400 (240 Mg) MG tablet last filled 04/22/21 30 DS metFORMIN (GLUCOPHAGE) 500 MG tablet last filled 02/18/21 90 DS metoprolol tartrate (LOPRESSOR) 100 MG tablet last filled 04/22/21 30 DS montelukast (SINGULAIR) 10 MG tablet last filled 03/28/21 90 DS nitroGLYCERIN (NITROSTAT) 0.4 MG SL tablet last filled 04/01/21 90 DS Oxycodone HCl 10 MG TABS last filled 04/28/21 25 DS OXYGEN pantoprazole (PROTONIX) 40 MG tablet last filled 04/16/21 30 DS phenazopyridine (PYRIDIUM) 100 MG tablet last filled 04/22/21 3 DS predniSONE (DELTASONE) 5 MG tablet last filled 04/03/21 9 DS promethazine (PHENERGAN) 25 MG tablet last filled 04/12/21 30 DS rosuvastatin (CRESTOR) 40 MG tablet last filled 02/03/21 30 DS sertraline (ZOLOFT) 100 MG tablet last filled 03/09/21 90 DS   [place] Clinical Pharmacist Assistant [phone]

## 2021-05-04 ENCOUNTER — Other Ambulatory Visit: Payer: Self-pay | Admitting: Cardiology

## 2021-05-07 ENCOUNTER — Other Ambulatory Visit: Payer: Self-pay

## 2021-05-07 ENCOUNTER — Ambulatory Visit (INDEPENDENT_AMBULATORY_CARE_PROVIDER_SITE_OTHER): Payer: Medicare Other

## 2021-05-07 DIAGNOSIS — E1159 Type 2 diabetes mellitus with other circulatory complications: Secondary | ICD-10-CM

## 2021-05-07 DIAGNOSIS — I509 Heart failure, unspecified: Secondary | ICD-10-CM

## 2021-05-07 DIAGNOSIS — I480 Paroxysmal atrial fibrillation: Secondary | ICD-10-CM

## 2021-05-07 DIAGNOSIS — I152 Hypertension secondary to endocrine disorders: Secondary | ICD-10-CM

## 2021-05-07 DIAGNOSIS — K219 Gastro-esophageal reflux disease without esophagitis: Secondary | ICD-10-CM

## 2021-05-07 DIAGNOSIS — E1142 Type 2 diabetes mellitus with diabetic polyneuropathy: Secondary | ICD-10-CM

## 2021-05-07 NOTE — Patient Instructions (Signed)
Katherine [name],  It was great to talk to you today!  Please call me with any questions or concerns.  Visit Information   PATIENT GOALS:   Goals Addressed             This Visit's Progress    Set My Target A1C-Diabetes Type 2       Timeframe:  Long-Range Goal Priority:  High Start Date:   05/07/2021                         Expected End Date:  11/04/2021                     Follow Up Date 06/17/2021   - set target A1C of <7.0% - check blood sugar at least 3 times daily     Why is this important?   Your target A1C is decided together by you and your doctor.  It is based on several things like your age and other health issues.       Track and Manage Heart Rate and Rhythm-Atrial Fibrillation/ CHF/ HTN       Timeframe:  Long-Range Goal Priority:  High Start Date:   05/07/2021                         Expected End Date:  11/04/2021                     Follow Up Date 06/17/2021   - check pulse (heart) rate before taking medicine - check pulse (heart) rate once a day - make a plan to exercise regularly - make a plan to eat healthy - keep all lab appointments - take medicine as prescribed    Why is this important?   Atrial fibrillation may have no symptoms. Sometimes the symptoms get worse or happen more often.  It is important to keep track of what your symptoms are and when they happen.  A change in symptoms is important to discuss with your doctor or nurse.  Being active and healthy eating will also help you manage your heart condition.        Track and Manage My Symptoms-COPD       Timeframe:  Long-Range Goal Priority:  High Start Date:   05/07/2021                         Expected End Date:  11/04/2021                     Follow Up Date 06/17/2021   - develop a rescue plan - eliminate symptom triggers at home - follow rescue plan if symptoms flare-up - keep follow-up appointments    Why is this important?   Tracking your symptoms and other information about  your health helps your doctor plan your care.  Write down the symptoms, the time of day, what you were doing and what medicine you are taking.  You will soon learn how to manage your symptoms.          Consent to CCM Services: Ms. Lochridge was given information about Chronic Care Management services including:  CCM service includes personalized support from designated clinical staff supervised by her physician, including individualized plan of care and coordination with other care providers 24/7 contact phone numbers for assistance for urgent and routine care needs. Service will  only be billed when office clinical staff spend 20 minutes or more in a month to coordinate care. Only one practitioner may furnish and bill the service in a calendar month. The patient may stop CCM services at any time (effective at the end of the month) by phone call to the office staff. The patient will be responsible for cost sharing (co-pay) of up to 20% of the service fee (after annual deductible is met).  Patient agreed to services and verbal consent obtained.   Patient verbalizes understanding of instructions provided today and agrees to view in [place].   Telephone follow up appointment with care management team member scheduled for: The patient has been provided with contact information for the care management team and has been advised to call with any health related questions or concerns.   Tomasa Blase, PharmD Clinical Pharmacist, [place]    CLINICAL CARE PLAN: Patient Care Plan: Heart Failure (Adult)     Problem Identified: Symptom Exacerbation (Heart Failure)   Priority: High     Long-Range Goal: Symptom Exacerbation Prevented or Minimized   Start Date: 03/13/2021  Expected End Date: 03/13/2022  This Visit's Progress: Not on track  Recent Progress: Not on track  Priority: High  Note:   Current Barriers:  Knowledge Deficits related to basic heart failure pathophysiology  and self care/management Knowledge Deficits related to medications used for management of multiple chronic conditions: polypharmacy with high risk medications noted- CCM Pharmacy referral placed 03/13/21; second CCM Pharmacy referral placed 04/02/21 UNABLE to independently verbalize blood sugars at home, insulin dosing; does not record on paper Multiple recent hospitalizations:  Chronic pain- attends pain management clinic Fragile state of health, multiple progressing chronic health conditions Memory loss- new patient neurological appointment scheduled 04/30/21- patient has difficulty focusing/ flight of thoughts/ ideas Case Manager Clinical Goal(s):  03/13/21: Over the next 12 months, patient will verbalize understanding of Heart Failure Action Plan and when to call doctor, as evidenced by patient/ caregiver reporting during CCM RN CM outreach of: Completion of daily weight monitoring/ recording (notifying MD of 3 lb weight gain over night or 5 lb in a week) 04/07/21: patient reports has not been monitoring/ recording weights at home post- recent hospital and ED discharges; reports "in too much pain," "Too sick" to weigh Adhering to low salt diet Taking prescribed medications as prescribed 04/02/21: patient reports ongoing significant confusion around her medications and cannot tell me what she is/ is not taking Attending all care team/ provider appointments Contacting care team for questions, concerns, or problems Interventions:  Collaboration with [name], MD regarding development and update of comprehensive plan of care as evidenced by provider attestation and co-signature Inter-disciplinary care team collaboration (see longitudinal plan of care) Chart reviewed including relevant office notes, upcoming scheduled appointments, recent hospital visits and ED visit post- our last appointment, and lab results Discussed current  clinical condition with patient and she reports, "things are better  today," states that she is feeling better than our last conversation last week; patient  unable to recall any of the details around our call and did not remember speaking with me at all until she was reminded of the specific details of our conversation Confirms still not monitoring/ recording weights at home;  reminded patient of previously provided education around importance of daily weight monitoring at home, along with action plan for same Confirmed that patient denies SOB, excessive/ worsened swelling over baseline Confirmed that patient reports continues to wear home O2 "  all the time" at 3 L/min After reminding patient about our conversation about hospice/ palliative care options, she declines ongoing conversation around same; states that she thinks her family may be trying to "put me in a nursing home;" patient adamantly declines this option, states her cat and her dog are too important to her to go anywhere but her own home Discussed need to re-schedule appointment with [facility]ib clinic promptly- she stated "they are supposed to call me;" made her aware that she should call to schedule appointment and informed her that Tikosyn refills will be compromised if she does not schedule and attend follow up visit Reiterated previously provided verbal education around signs/ symptoms yellow CHF zone/ weight gain guidelines in setting of CHF along with corresponding action plan:  patient will require ongoing reinforcement around same, given her ongoing stated and demonstrated inability to focus; and lack of action in consistently monitoring her daily weights, questionable medication adherence, etc   Reviewed scheduled/upcoming provider appointments including: 04/30/21- neurological (new patient) provider visit; 06/04/21- pain management clinic Discussed plans with patient for ongoing care management follow up and provided patient with direct contact information for care management team Self-Care  Activities: UNABLE to independently verbalize blood sugars at home, insulin dosing; does not record on paper Self administers oral medications as prescribed-- however- polypharmacy with high risk medications noted- CCM Pharmacy referral placed Self administers insulin as prescribed Attends all scheduled provider appointments- niece handles provider appointments and provides transportation Patient Goals: Please resume checking your daily weight at home and writing them down on paper-- call cardiology provider office if you gain more than 2 pounds in one day or 5 pounds in one week Write weights down on paper so it is easier to keep up with: you reported today that you have not been checking your weights at home Wear your home oxygen as prescribed Use salt in moderation Watch for swelling in feet, ankles and legs every day- if you notice new or increased swelling, please contact your cardiology (heart) doctor right away I have again asked the pharmacy team from Dr. Ronnald Ramp' office to contact you by phone to review your medications with you- please listen for a call from the pharmacy team Please schedule an appointment with Dr. Ronnald Ramp since you have had multiple hospital visits since your last office visit Please make an appointment with the [facility] to re-schedule your missed appointment from last week; this is important because they will not be able to re-fill your prescriptions if you do not make an appointment Follow Up Plan:  Telephone follow up appointment with care management team member scheduled for:  Tuesday June 24, 2021 at 3:30 pm The patient has been provided with contact information for the care management team and has been advised to call with any health related questions or concerns.      Patient Care Plan: Diabetes Type 2 (Adult)     Problem Identified: Glycemic Management (Diabetes, Type 2)   Priority: Medium     Long-Range Goal: Glycemic Management Optimized   Start  Date: 03/13/2021  Expected End Date: 03/13/2022  This Visit's Progress: Not on track  Recent Progress: Not on track  Priority: Medium  Note:   Objective:  Lab Results  Component Value Date   HGBA1C 9.1 (H) 01/31/2021   Lab Results  Component Value Date   CREATININE 0.81 02/21/2021   CREATININE 0.81 02/20/2021   CREATININE 0.97 02/19/2021   No results found for: EGFR Current Barriers:  Knowledge Deficits  related to basic Diabetes pathophysiology and self care/management Knowledge Deficits related to medications used for management of diabetes: polypharmacy with high risk medications noted- CCM Pharmacy referral placed 03/13/21-- second CCM pharmacy referral placed today 04/02/21 UNABLE to independently verbalize blood sugars at home, insulin dosing; does not record on paper Multiple recent hospitalizations: since initial outreach 03/13/21--  4 inpatient hospitalizations over last 9 months Chronic pain- attends pain management clinic Fragile state of health, multiple progressing chronic health conditions Memory loss- new patient neurological appointment scheduled 04/30/21- patient has difficulty focusing/ flight of thoughts/ ideas Increased A1-C from 7.7 (07/11/21) to 9.1 (01/31/21) Case Manager Clinical Goal(s):  03/13/21: Over the next 12 months, patient will demonstrate improved adherence to prescribed treatment plan for diabetes self care/management as evidenced by patient reporting during CCM RN CM outreach of:  Daily monitoring and recording of CBG 2-3 daily 04/07/21: patient again reports not monitoring/ recording blood sugars at home post- most recent hospital discharge, and then subsequent ED visit 03/30/21 Adherence to ADA/ carb modified, low sugar diet 04/07/21: patient again reports "not eating much;" reports conflicting feedback around use of insulin: at one point in conversation she reports has been taking, in another she states she has not been taking Adherence to prescribed  medication regimen 04/07/21: patient reports today that she and her friend "found" her missing pain medications as reported to me 04/02/21-- patient has no recollection of our phone call on Wednesday 04/12/21; nor of our phone call to her outpatient pharmacy to follow up on ED prescription from 03/30/21; today she reports "taking" antibiotic  Contacting care providers for new or worsened symptoms, concerns or questions Interventions:  Collaboration with [name], MD regarding development and update of comprehensive plan of care as evidenced by provider attestation and co-signature Inter-disciplinary care team collaboration (see longitudinal plan of care) Review of patient status, including review of consultants reports, relevant laboratory and other test results, new/ recent hospitalization August 5-6, 2022; ED visit March 30, 2021 and medications completed Discussed current clinical condition with patient: today, she sounds better than she did 04/02/21 and reports "doing okay" Again reports has not been eating much; has not been checking blood sugars at home; inconsistent in reports of whether she is taking insulin/ medications Reminded patient of importance of monitoring blood sugars and eating properly when taking insulin; states "sometimes when you are sick you just don't feel like doing those things" Reiterated role of CCM team: patient declines needs; states she would like for someone to come in to her home and help her on a regular basis-- I confirmed that she has medicaid and we discussed personal care services and transportation benefit with medicaid: patient reports she has used transportation benefit before and "hated it" because she had to ride on a "bumpy bus with a lot of other people;" stated it was too uncomfortable for her.  She declines need to speak with [employer] for assistance navigating medicaid PCS benefits- states she has her case workers phone number and will  call her self to inquire Again encouraged patient to schedule an appointment with Dr. Ronnald Ramp; she states she will do Encouraged patient to listen for/ engage with pharmacy team for assistance with polypharmacy; patient confusion around medications in general; multiple high risk medications Reviewed scheduled/upcoming provider appointments including:  04/30/21- neurological (new patient) provider visit; 06/04/21- pain management clinic Discussed plans with patient for ongoing care management follow up and provided patient with direct contact information for care management team  Self-Care Activities UNABLE to independently verbalize blood sugars at home, insulin dosing; does not record on paper Self administers oral medications as prescribed-- however- polypharmacy with high risk medications noted- CCM Pharmacy referral placed Self administers insulin as prescribed Attends all scheduled provider appointments- niece handles provider appointments and provides transportation Patient Goals: Please schedule an appointment with Dr Ronnald Ramp- he has not had a visit with you since December 2021 and you have had multiple hospital visits since your last visit with Dr. Ronnald Ramp Please resume checking and writing down blood sugars at home 2-3 times per day; check blood sugar more often if I feel it is too high or too low or if I feel sick Take all medications as prescribed: I have again asked the Pharmacy team at Dr. Ronnald Ramp' office to contact you to review your medications: please listen for a call from the pharmacy team Please write your blood sugars from home down on paper so we can review these during our phone call visits Follow Up Plan:  Telephone follow up appointment with care management team member scheduled for: Tuesday June 24, 2021 at 3:30 pm The patient has been provided with contact information for the care management team and has been advised to call with any health related questions or concerns.       Patient Care Plan: CCM Care Plan     Problem Identified: Hypertension, Hyperlipidemia, Diabetes, Atrial Fibrillation, Heart Failure, Coronary Artery Disease, GERD, COPD, Chronic Kidney Disease, Depression, Anxiety, Osteoarthritis, Fibromyalgia and Allergic Rhinitis   Priority: High  Onset Date: 05/07/2021     Long-Range Goal: Disease Management   Start Date: 05/07/2021  Expected End Date: 11/04/2021  This Visit's Progress: On track  Priority: High  Note:   Current Barriers:  Unable to independently monitor therapeutic efficacy Does not adhere to prescribed medication regimen Does not maintain contact with provider office Does not contact provider office for questions/concerns  Pharmacist Clinical Goal(s):  Patient will achieve adherence to monitoring guidelines and medication adherence to achieve therapeutic efficacy adhere to prescribed medication regimen as evidenced by no missed doses of prescribed medications  contact provider office for questions/concerns as evidenced notation of same in electronic health record through collaboration with PharmD and provider.   Interventions: 1:1 collaboration with [name], MD regarding development and update of comprehensive plan of care as evidenced by provider attestation and co-signature Inter-disciplinary care team collaboration (see longitudinal plan of care) Comprehensive medication review performed; medication list updated in electronic medical record  Hyperlipidemia/ CAD/ History of NSTEMI: (LDL goal < 70) -Controlled Lab Results  Component Value Date   [place] 15 02/20/2021  -Current treatment: Rosuvastatin 5m daily  Clopidogrel 769m- 1 tablet daily  Nitroglycerin 0.68m66m 1 tablet every 5 minutes x 3 doses as needed for chest pain -Medications previously tried: pravastatin   -Current dietary patterns: unable to discuss during this appointment  -Current exercise habits: limited due to her chronic pain  -Educated on  Cholesterol goals;  Benefits of statin for ASCVD risk reduction; Importance of limiting foods high in cholesterol; -Counseled on diet and exercise extensively Recommended to continue current medication  Diabetes (A1c goal <7%) -Not ideally controlled Lab Results  Component Value Date   HGBA1C 9.1 (H) 01/31/2021  -Current medications: Novolog - 14 units 3 times daily with meals  Basaglar - 12 units once daily  Glimepiride 68mg28m1 tablet daily  Metformin 500mg26m tablet twice daily  -Medications previously tried: lantus,   -Current home  glucose readings fasting glucose: patient has not been checking blood sugars  -Reports hypoglycemic/hyperglycemic symptoms that can occur on days that she does not eat as much as she typically does  -Current meal patterns:  Unable to address in appointment today -Current exercise: limited due to chronic pain -Educated on A1c and blood sugar goals; Complications of diabetes including kidney damage, retinal damage, and cardiovascular disease; Exercise goal of 150 minutes per week; Benefits of weight loss; Proper insulin injection technique; Prevention and management of hypoglycemic episodes; Benefits of routine self-monitoring of blood sugar; -Counseled to check feet daily and get yearly eye exams -Counseled on diet and exercise extensively Recommended for patient to start checking blood sugars at least 3 times a day with meals - patient would qualify for CGM but will need an updated PCP visit for CGM order -Adjustments to medications can be determined once blood sugar average is known   Atrial Fibrillation (Goal: prevent stroke and major bleeding) -Controlled -CHADSVASC: 5 -Current treatment: Rate control: Dofetilide (Tikosyn) 244mg - 1 capsule twice daily, Diltiazem 368m- 1 tablet every 4 hours as needed (HR >100 bpm) Metoprolol Tartrate 10071m 1 tablet twice daily  Anticoagulation: Eliquis 5mg61m1 tablet twice daily  -Medications  previously tried: diltiazem CD 120mg64mHome BP and HR readings: reports that blood pressures are typically averaging 130/80 with HR anywhere from 57-70bpm  -Counseled on increased risk of stroke due to Afib and benefits of anticoagulation for stroke prevention; importance of adherence to anticoagulant exactly as prescribed; bleeding risk associated with Eliquis and importance of self-monitoring for signs/symptoms of bleeding; avoidance of NSAIDs due to increased bleeding risk with anticoagulants; importance of regular laboratory monitoring; seeking medical attention after a head injury or if there is blood in the urine/stool; Afib action plan reviewed -Recommended to continue current medication  Heart Failure (Goal: manage symptoms and prevent exacerbations)/ Hypertension (BP goal <140/90) -Not ideally controlled -Last ejection fraction: 50-60% (Date: 01/28/2017) -HF type: Combined Systolic and Diastolic -NYHA Class: III (marked limitation of activity) -Current treatment: Metoprolol Tartrate 100mg 60mtablet twice daily  Isosorbide Mononitrate 60mg -23mablet daily  Furosemide 40mg - 28mblet daily - not currently taking  -Medications previously tried: 01/30/2021  -Current home BP/HR readings: reports that blood pressures are typically averaging 130/80 with HR anywhere from 57-70bpm  -Current dietary habits: unable to be discussed with appointment today, patient reports very limited and small appetite  -Current exercise habits: limited due to chronic pain -Educated on Benefits of medications for managing symptoms and prolonging life Importance of weighing daily; if you gain more than 3 pounds in one day or 5 pounds in one week, make office aware  Proper diuretic administration and potassium supplementation Importance of blood pressure control -Recommended for patient to restart furosemide   COPD (Goal: control symptoms and prevent exacerbations) -Controlled -Current treatment   Arformoterol (Brovana) 15mcg/52m26m1 nebulization twice daily (uses with budesonide) Budesonide 0.5mg/52mL -56mnebulization twice daily (uses with brovana)  Albuterol 108mcg/act 64minhaler - 2 puffs every 6 hours as needed  - using twice daily  Montelukast 10mg - 1 ta76m daily  Oxygen - 3L continously  -Medications previously tried: xopenex, lonhala magnair symbicort,   -Gold Grade: Gold 2 (FEV1 50-79%) -Pulmonary function testing: 10/19/2017 -Exacerbations requiring treatment in last 6 months: n/a -Patient reports consistent use of maintenance inhaler -Frequency of rescue inhaler use: 2 times daily  -Counseled on Proper inhaler technique; Benefits of consistent maintenance inhaler use  When to use rescue inhaler Differences between maintenance and rescue inhalers -Recommended to continue current medication  Depression/Anxiety (Goal: Promotion of positive mood) -Not ideally controlled - patient tearful at times throughout appointment due to levels of fatigue / forgetfulness that she has issues with at times  -Current treatment: Sertraline 124m - 1 tablet daily  Lorazepam 171m- 1 tablet twice daily  Duloxetine 6047m 1 tablet daily (being used for fibromyalgia) -Medications previously tried/failed: wellbutrin, abilify, viibryd -PHQ9: unable to assess at appointment  -Educated on Benefits of medication for symptom control Benefits of cognitive-behavioral therapy with or without medication -Recommended to continue current medication  Allergic Rhinitis (Goal: Prevention/ treatment of allergy attacks) -Controlled -Current treatment  Cetirizine 5mg72m1 tablet daily  Fluticasone 50mc79mt - 2 puffs into each nostril daily  -Medications previously tried: nasocort   -Recommended to continue current medication  CKD / kidney transplant (Goal: Prevention of disease progression / prevention of rejection of transplant) -Controlled -Last eGFR 59 mL/min - last BMP WNL -Current treatment   Azathioprine 50mg 72m5mg d38m  Calcitriol 0.25mcg -54mapsule 3 times weekly Prednisone 5mg - 1 87mlet daily  -Medications previously tried: n/a  -Recommended to continue current medication  GERD (Goal: Prevention/ control of acid reflux) -Controlled -Current treatment  Pantoprazole 40mg - 1 68met daily  -Medications previously tried: omeprazole   -Counseled on diet and exercise extensively Recommended to continue current medication   Chronic Pain / Fibromyalgia (Goal: Pain control) - Follows with Dr. Schwartz -Tessa Lernered -Current treatment  Oxycodone 10mg - 1 t72mt 3 times daily  Acetaminophen 500mg - 3 ta24ms twice daily  Duloxetine 60mg - 1 cap59m daily  Lamotrigine 25mg - 1 tabl24mt bedtime  -Medications previously tried: carbamazepine, butrans, norco, percocet, tramadol -Recommended to continue current medication Counseled on potential additive side effects of medications that could be contributing to drowsiness, advised for patient to trial reducing oxycodone dose to 1 tablet every 6 hours as needed in hopes of being able to control pain on a lower daily dose of oxycodone than she is currently taking   Duloxetine/ sertraline combination has risk of serotonin syndrome, has been trialed on numerous antidepressants, will continue to work with patient to try to wean down on duplicate therapy but for now will continue   Patient Goals/Self-Care Activities Patient will:  - take medications as prescribed focus on medication adherence by taking all medications as prescribed check glucose 3-4 times daily, document, and provide at future appointments check blood pressure daily, document, and provide at future appointments weigh daily, and contact provider if weight gain of >3lbs in a day / >5lbs in a week  Follow Up Plan: Telephone follow up appointment with care management team member scheduled for: 1 month The patient has been provided with contact information for the care  management team and has been advised to call with any health related questions or concerns.

## 2021-05-07 NOTE — Progress Notes (Signed)
Chronic Care Management Pharmacy Note  05/07/2021 Name:  Katherine [name] MRN:  [id number] DOB:  24-Jan-1956  Summary: -Patient reports that she recently has moved in with her younger brother who has been helping her with her needs, has been living with him for about 2 weeks, reports that she as a friend who helps manage her medications and keeps them in order  -Patient reports that she has very little energy most days, can become confused easily and lose focus, since latest hospitalization her appetite has not been the same an she has not been eating much  -reports that she has been taking all of her medications as prescribed, notes that she has had instances of low blood sugars - with her last hypoglycemic reading she reports that she did not check her blood sugar before injecting novolog - required EMT visit who helped her bring her blood sugar back into range with orange juice -Patient notes that she has not been weighing herself as she does not have her scale now that she is living with her brother, not taking furosemide at this time as she reports she was instructed after last ED visit to stop due to how dehydrated she was - per discharge report patient was to resume   Recommendations/Changes made from today's visit: - Patients fatigue levels/ confusion likely related to polypharmacy, patient is taking multiple CNS depressing medications - advised patient to start with reduction in flexeril dosing - reduce to cyclobenzaprine 36m twice daily as needed, advised for patient to stop using phenergan every day and only use as needed - plan to continue to step down / eliminate CNS depressing medications as able/ tolerated  -Recommended for patient to start testing blood sugar before each meal and at bedtime - will work to have CGM covered by insurance but requires a PCP visit as she has not had one within the last 6 months -Recommended for patient to start monitoring blood pressure / HR as well as  monitoring weight daily and reaching out if she has a weight gain of >3lbs in a day or >5lbs in a week  - patient also to restart furosemide- patient agreeable to restart at 259mdaily  Subjective: Katherine [name] an 89.o. year old female who is a primary patient of JoJanith LimaMD.  The CCM team was consulted for assistance with disease management and care coordination needs.    Engaged with patient by telephone for initial visit in response to provider referral for pharmacy case management and/or care coordination services.   Consent to Services:  The patient was given the following information about Chronic Care Management services today, agreed to services, and gave verbal consent: 1. CCM service includes personalized support from designated clinical staff supervised by the primary care provider, including individualized plan of care and coordination with other care providers 2. 24/7 contact phone numbers for assistance for urgent and routine care needs. 3. Service will only be billed when office clinical staff spend 20 minutes or more in a month to coordinate care. 4. Only one practitioner may furnish and bill the service in a calendar month. 5.The patient may stop CCM services at any time (effective at the end of the month) by phone call to the office staff. 6. The patient will be responsible for cost sharing (co-pay) of up to 20% of the service fee (after annual deductible is met). Patient agreed to services and consent obtained.  Patient Care Team: JoJanith LimaMD  as PCP - General (Internal Medicine) [name], MD as PCP - Cardiology (Cardiology) Terance Ice, MD (Inactive) (Cardiology) Deterding, Jeneen Rinks, MD as Consulting Physician (Nephrology) Earlie Server, MD as Consulting Physician (Orthopedic Surgery) Knox Royalty, RN as Case Manager [name], MD (Internal Medicine)  Recent office visits:  03/25/21 Video Colin Benton DO- pt was seen for cough no  labs were ordered and there were no med changes. No follow up noted. 08/13/20 - Scarlette Calico MD (PCP) -  f/u - discontinued ferrous sulfate, continue on lorazepam, start fluconazole x 10 days    Recent consult visits:  03/12/21 Tawni Levy MD Physical Med- pt was seen for follow up. Labs were ordered and referral for home health was placed. Pt started on Amox. 250 mg TID. Virtual follow up in 2 months   01/01/21 Telephone Baltazar Apo MD Pulm.- Pt seen for chronic allergic rhinitis. There were no labs or med changes. Follow up in 6 months or prn.   12/25/20 Danella Sensing NP- Pt seen for Spondylosis of lumbar region. No med changes or labs were noted. Follow up 1 month.   11/20/20 Video Baltazar Apo MD Pulm.- pt was seen for Mild persistent extrinsic asthma.Pt advised stop the pred taper and go back to 68m daily and finish the doxycycline.  Follow up may 4th 2022.   Hospital visits:  1.Medication Reconciliation was completed by comparing discharge summary, patient's EMR and Pharmacy list.   Admitted to the hospital on 04/19/21 due to Nausea/Vomiting. Discharge date was 04/22/21. [facility] Magnesium oxide   Medication Changes at Hospital Discharge: -Changed none   Medications Discontinued at Hospital Discharge: -Stopped none   Medications that remain the same after Hospital Discharge:??  -All other medications will remain the same.     2.Medication Reconciliation was completed by comparing discharge summary, patient's EMR and Pharmacy list.   Admitted to the hospital on 04/12/21 due to Fall. Discharge date was not noted. [facility]   Medication Changes at Hospital Discharge: -Changed none   Medications Discontinued at Hospital Discharge: -Stopped none   Medications that remain the same after Hospital  Discharge:??  -All other medications will remain the same.        3. Medication Reconciliation was completed by comparing discharge summary, patient's EMR and Pharmacy list. Admitted to ED 03/30/21 due to fall. Discharged 03/31/21  [facility]   Medication Changes at Hospital Discharge: -Changed none   Medications Discontinued at Hospital Discharge: -Stopped none   Medications that remain the same after Hospital Discharge:??  -All other medications will remain the same.    4.Medication Reconciliation was completed by comparing discharge summary, patient's EMR and Pharmacy list.   Admitted to the hospital on 03/21/21 due to Chest pain. Discharge date was 03/22/21. [facility] diclofenac Sodium (VOLTAREN)   Medication Changes at Hospital Discharge: -Changed albuterol (VENTOLIN HFA) cyclobenzaprine (FLEXERIL) diltiazem (Cardizem) DULoxetine (CYMBALTA) Eliquis (apixaban) fluticasone (FLONASE) lamoTRIgine (LAMICTAL) metoprolol tartrate (LOPRESSOR) montelukast (SINGULAIR)   Medications Discontinued at Hospital Discharge: -Stopped none   Medications that remain the same after Hospital Discharge:??  -All other medications will remain the same.     5.Medication Reconciliation was completed by comparing discharge summary, patient's EMR and Pharmacy list.  Admitted to the hospital on 02/19/21/22 due to Unstable Chest pain. Discharge date was 7/822. Discharged from [place]?Medications Started at [facility] Discharge:?? -started clopidogrel (PLAVIX)   Medication Changes at Hospital Discharge: -Changed albuterol (VENTOLIN HFA) cyclobenzaprine (FLEXERIL) DULoxetine (CYMBALTA) Eliquis (apixaban) fluticasone (FLONASE) lamoTRIgine (LAMICTAL) metoprolol tartrate (LOPRESSOR) montelukast (SINGULAIR) Oxycodone HCl promethazine  (PHENERGAN)   Medications Discontinued at Hospital Discharge: -Stopped Aspirin and Omeprazole   Medications that remain the same after Hospital Discharge:??  -All other medications will remain the same.     6.Medication Reconciliation was completed by comparing discharge summary, patient's EMR and Pharmacy list.   Admitted to the hospital on 01/28/21 due to Acute CHF. Discharge date was 02/03/21. Discharged from [place]?Medications Started at [facility] Discharge:?? -started Aspirin low dose and isosorbide mononitrate (IMDUR)   Medication Changes at Hospital Discharge: -Changed albuterol (VENTOLIN HFA) arformoterol (BROVANA) budesonide (PULMICORT) cyclobenzaprine (FLEXERIL) DULoxetine (CYMBALTA) Eliquis (apixaban) fluticasone (FLONASE) lamoTRIgine (LAMICTAL) metoprolol tartrate (LOPRESSOR) Oxycodone HCl rosuvastatin (CRESTOR)   Medications Discontinued at Hospital Discharge: -Stopped diltiazem 120 MG 24 hr capsule (CARDIZEM CD) diltiazem 30 MG tablet (CARDIZEM) doxycycline 100 MG tablet (VIBRA-TABS)   Medications that remain the same after Hospital Discharge:??  -All other medications will remain the same.    Objective:  Lab Results  Component Value Date   CREATININE 1.06 (H) 04/22/2021   BUN 8 04/22/2021   GFR 75.91 08/23/2019   GFRNONAA 59 (L) 04/22/2021   GFRAA 58 (L) 04/12/2020   NA 139 04/22/2021   K 3.6 04/22/2021   CALCIUM 8.7 (L) 04/22/2021   CO2 27 04/22/2021   GLUCOSE 140 (H) 04/22/2021    Lab Results  Component Value Date/Time   HGBA1C 9.1 (H) 01/31/2021 01:43 AM   HGBA1C 7.7 (H) 07/11/2020 11:04 AM   GFR 75.91 08/23/2019 02:18 PM   GFR 64.41 10/21/2018 02:49 PM   MICROALBUR <0.7 08/13/2020 03:50 PM   MICROALBUR 30 11/30/2017 04:56 PM    Last diabetic Eye exam:  Lab Results  Component Value Date/Time   HMDIABEYEEXA No Retinopathy 10/31/2020 12:00 AM    Last diabetic Foot exam:  No results found for: HMDIABFOOTEX   Lab  Results  Component Value Date   CHOL 99 02/20/2021   HDL 51 02/20/2021   LDLCALC 15 02/20/2021   TRIG 165 (H) 02/20/2021   CHOLHDL 1.9 02/20/2021    Hepatic Function Latest Ref Rng & Units 04/19/2021 04/12/2021 03/30/2021  Total Protein 6.5 - 8.1 g/dL 6.7 6.7 7.2  Albumin 3.5 - 5.0 g/dL 2.5(L) 2.8(L) 3.1(L)  AST 15 - 41 U/L 33 15 19  ALT 0 - 44 U/L _0 Alk Phosphatase 38 - 126 U/L 116 110 94  Total Bilirubin 0.3 - 1.2 mg/dL 0.7 1.0 0.8  Bilirubin, Direct 0.0 - 0.2 mg/dL 0.3(H) - -    Lab Results  Component Value Date/Time   TSH 1.176 01/29/2021 06:25 AM   TSH 0.87 08/13/2020 03:50 PM   TSH 3.118 04/20/2018 07:40 AM   TSH 0.83 11/30/2017 04:05 PM    CBC Latest Ref Rng & Units 04/22/2021 04/20/2021 04/19/2021  WBC 4.0 - 10.5 K/uL 6.6 5.5 9.0  Hemoglobin 12.0 - 15.0 g/dL 9.6(L) 10.4(L) 11.8(L)  Hematocrit 36.0 - 46.0 % 29.8(L) 34.9(L) 38.3  Platelets 150 - 400 K/uL 241 274 363    No results found for: VD25OH  Clinical ASCVD: Yes  The ASCVD Risk score (Arnett DK, et al., 2019) failed  to calculate for the following reasons:   The patient has a prior MI or stroke diagnosis    Depression screen [facility] 2/9 03/13/2021 03/12/2021 03/12/2021  Decreased Interest _0 Down, Depressed, Hopeless _1 PHQ - 2 Score _2 Altered sleeping 2 - -  Tired, decreased energy 2 - -  Change in appetite 1 - -  Feeling bad or failure about yourself  0 - -  Trouble concentrating 1 - -  Moving slowly or fidgety/restless 1 - -  Suicidal thoughts 0 - -  PHQ-9 Score 11 - -  Difficult doing work/chores Somewhat difficult - -  Some recent data might be hidden   Social History   Tobacco Use  Smoking Status Every Day   Packs/day: 1.00   Years: 30.00   Pack years: 30.00   Types: Cigarettes   Last attempt to quit: 08/17/2002   Years since quitting: 18.7  Smokeless Tobacco Never   BP Readings from Last 3 Encounters:  04/22/21 132/81  04/12/21 (!) 154/70  03/31/21 (!) 158/93   Pulse Readings  from Last 3 Encounters:  04/22/21 61  04/12/21 60  03/31/21 96   Wt Readings from Last 3 Encounters:  04/22/21 213 lb 13.5 oz (97 kg)  04/12/21 200 lb (90.7 kg)  03/21/21 215 lb (97.5 kg)   BMI Readings from Last 3 Encounters:  04/22/21 40.41 kg/m  04/12/21 37.79 kg/m  03/21/21 40.62 kg/m    Assessment/Interventions: Review of patient past medical history, allergies, medications, health status, including review of consultants reports, laboratory and other test data, was performed as part of comprehensive evaluation and provision of chronic care management services.   SDOH:  (Social Determinants of Health) assessments and interventions performed: Yes  SDOH Screenings   Alcohol Screen: Not on file  Depression (PHQ2-9): Medium Risk   PHQ-2 Score: 11  Financial Resource Strain: Not on file  Food Insecurity: No Food Insecurity   Worried About Charity fundraiser in the Last Year: Never true   Ran Out of Food in the Last Year: Never true  Housing: Low Risk    Last Housing Risk Score: 0  Physical Activity: Not on file  Social Connections: Not on file  Stress: Not on file  Tobacco Use: High Risk   Smoking Tobacco Use: Every Day   Smokeless Tobacco Use: Never  Transportation Needs: No Transportation Needs   Lack of Transportation (Medical): No   Lack of Transportation (Non-Medical): No    CCM Care Plan  Allergies  Allergen Reactions   Tetracycline Hives    Patient tolerated Doxycycline Dec 2020   Niacin Other (See Comments)    Mouth blisters   Niaspan [Niacin Er] Other (See Comments)    Mouth blisters   Sulfa Antibiotics Nausea Only and Other (See Comments)    "Tears up stomach"   Sulfonamide Derivatives Other (See Comments)    Reaction: per patient "tears her stomach up"   Codeine Nausea And Vomiting   Erythromycin Nausea And Vomiting   Hydromorphone Hcl Nausea And Vomiting   Morphine And Related Nausea And Vomiting   Nalbuphine Nausea And Vomiting    Nubain    Sulfasalazine Nausea Only and Other (See Comments)    per patient "tears her stomach up", "Tears up stomach"   Tape Rash and Other (See Comments)    No "plastic" tape," please----cloth tape only    Medications Reviewed Today     Reviewed by Tomasa Blase,  [place] (Pharmacist) on 05/07/21 at 1609  Med List Status: <None>   Medication Order Taking? Sig Documenting Provider Last Dose Status Informant  acetaminophen (TYLENOL) 500 MG tablet [id number] Yes Take 1,500 mg by mouth 2 (two) times daily. [provider] Taking Active Family Member  albuterol (VENTOLIN HFA) 108 (90 Base) MCG/ACT inhaler [id number] Yes TAKE 2 PUFFS BY MOUTH EVERY 6 HOURS AS NEEDED FOR WHEEZE OR SHORTNESS OF BREATH  Patient taking differently: Inhale 2 puffs into the lungs every 6 (six) hours as needed for wheezing or shortness of breath.   Collene Gobble, MD Taking Active            Med Note Orvan Seen, Sharlette Dense   Fri Mar 21, 2021  5:43 PM) LF 02/10/2021 (25DS) [facility]  apixaban (ELIQUIS) 5 MG TABS tablet [id number] Yes Take 1 tablet (5 mg total) by mouth 2 (two) times daily. APPOINTMENT NEEDED WITH CARDIOLOGIST FOR FURTHER REFILLS Sherran Needs, NP Taking Active Family Member  arformoterol [facility]) 15 MCG/2ML NEBU [id number] Yes Take 2 mLs (15 mcg total) by nebulization 2 (two) times daily. Collene Gobble, MD Taking Active Family Member  azaTHIOprine (IMURAN) 50 MG tablet [id number] Yes Take 125 mg by mouth See admin instructions. Take 2 1/2 tablets (125 mg) by mouth daily at 3pm [provider] Taking Active Family Member           Med Note Orvan Seen, Sharlette Dense   Fri Mar 21, 2021  5:33 PM) #75/30 days filled 03/15/2021 [facility]  budesonide (PULMICORT) 0.5 MG/2ML nebulizer solution [id number] Yes Take 2 mLs (0.5 mg total) by nebulization 2 (two) times daily. [name], MD Taking Active Family Member  calcitRIOL (ROCALTROL) 0.25 MCG capsule [id number] Yes Take  0.25 mcg by mouth every 3 (three) days.  [provider] Taking Active Family Member           Med Note Orvan Seen, Sharlette Dense   Fri Mar 21, 2021  5:45 PM) #45/90 days filled 02/03/[facility]21 [facility]  cetirizine (ZYRTEC) 5 MG tablet [id number] Yes Take 5 mg by mouth daily. [provider] Taking Active   clopidogrel (PLAVIX) 75 MG tablet [id number] Yes Take 1 tablet (75 mg total) by mouth daily. Cheryln Manly, NP Taking Active Family Member           Med Note Payton Doughty   Fri Mar 21, 2021  5:41 PM) #90 filled 02/21/2021 [facility]  clotrimazole [facility]) 10 MG troche [id number] No Take 1 tablet (10 mg total) by mouth 5 (five) times daily as needed (thrush).  Patient not taking: Reported on 05/07/2021   Edwin Dada, MD Not Taking Active Family Member           Med Note Payton Doughty   Fri Mar 21, 2021  5:42 PM) #40/10 days filed 02/18/2021 [facility]  cyclobenzaprine (FLEXERIL) 10 MG tablet [id number] Yes TAKE 2 TAB DAILY AT BEDTIME, MAY ALSO TAKE 1 TABLET BY MOUTH AT NOON AS NEEDED FOR MUSCLE SPASMS  Patient taking differently: Take 10 mg by mouth 2 (two) times daily as needed.   Meredith Staggers, MD Taking Active Family Member  diclofenac Sodium (VOLTAREN) 1 % GEL [id number] No Apply 2 g topically 4 (four) times daily.  Patient not taking: No sig reported   Debbe Odea, MD Not Taking Active Family Member  diltiazem (CARDIZEM) 30 MG tablet  [id number] No take 1 tablet every 4 hours AS NEEDED for afib heart rate >100 as long as top blood pressure >100.  Patient not taking: Reported on 05/07/2021   Sherran Needs, NP Not Taking Active            Med Note Payton Doughty   Fri Mar 21, 2021  5:37 PM) #30 filled 03/04/2021 [facility]  dofetilide [facility]) 250 MCG capsule [id number] Yes Take 1 capsule (250 mcg total) by mouth 2 (two) times daily. Sherran Needs, NP Taking Active  Family Member           Med Note Payton Doughty   Fri Mar 21, 2021  5:48 PM) #180/90 days filled 01/14/2021 [facility]  DULoxetine (CYMBALTA) 30 MG capsule [id number] Yes TAKE 2 CAPSULES (60 MG TOTAL) BY MOUTH EVERY MORNING.  Patient taking differently: Take 60 mg by mouth every morning.   Bayard Hugger, NP Taking Active            Med Note Orvan Seen, Sharlette Dense   Fri Mar 21, 2021  5:47 PM) #180/90 days filled 01/26/2021 [facility]  fluticasone (FLONASE) 50 MCG/ACT nasal spray [id number] Yes PLACE 2 SPRAYS INTO BOTH NOSTRILS 2 TIMES DAILY  Patient taking differently: Place 2 sprays into both nostrils 2 (two) times daily.   Collene Gobble, MD Taking Active            Med Note Orvan Seen, Sharlette Dense   Fri Mar 21, 2021  5:31 PM) LF 03/19/2021 (90DS) [facility]  furosemide (LASIX) 40 MG tablet [id number] No Take 1 tablet (40 mg total) by mouth daily.  Patient not taking: Reported on 05/07/2021   Dwyane Dee, MD Not Taking Active Family Member           Med Note Payton Doughty   Fri Mar 21, 2021  5:43 PM) #90 filled 02/13/2021 [facility]  glimepiride (AMARYL) 4 MG tablet [id number] Yes Take 4 mg by mouth daily. [provider] Taking Active Family Member           Med Note Orvan Seen, Sharlette Dense   Fri Mar 21, 2021  5:41 PM) #90 filled [facility]26/[facility]  insulin aspart (NOVOLOG FLEXPEN) 100 UNIT/ML FlexPen [id number] Yes Inject 14 Units into the skin 3 (three) times daily with meals. [provider] Taking Active Family Member           Med Note Orvan Seen, Sharlette Dense   Fri Mar 21, 2021  5:38 PM) LF 03/04/2021 (107 DS) [facility]  Insulin Glargine [facility]) 100 UNIT/ML SOPN [id number] Yes Inject 12 Units into the skin daily before breakfast. [provider] Taking Active Family Member           Med Note Orvan Seen, HEATHER L   Fri Mar 21, 2021   5:41 PM) LF 02/25/2021 (100 DS) [facility]  isosorbide mononitrate (IMDUR) 60 MG 24 hr tablet [id number] Yes Take 1 tablet (60 mg total) by mouth daily. Debbe Odea, MD Taking Active Family Member  lamoTRIgine (LAMICTAL) 25 MG tablet [id number] Yes TAKE 1 TABLET BY MOUTH EVERYDAY AT BEDTIME  Patient taking differently: Take 25 mg by mouth at bedtime.   Meredith Staggers, MD Taking Active            Med Note Orvan Seen, Sharlette Dense   Fri Mar 21, 2021  5:36 PM) #90 filled [facility]/05/[facility]21 [facility]  LORazepam (ATIVAN) 1 MG tablet [id number] Yes Take 1 tablet (1 mg total) by mouth 2 (two) times daily. [name], MD Taking Active Family Member           Med Note Orvan Seen, Sharlette Dense   Fri Mar 21, 2021  5:29 PM) #60/30 days filled 03/20/2021 [facility] [place]  magnesium oxide (MAG-OX) 400 (240 Mg) MG tablet [id number] No Take 1 tablet (400 mg total) by mouth daily.  Patient not taking: Reported on 05/07/2021   Flora Lipps, MD Not Taking Active   metFORMIN (GLUCOPHAGE) 500 MG tablet [id number] Yes Take 1 tablet (500 mg total) by mouth 2 (two) times daily. Burnell Blanks, MD Taking Active Family Member           Med Note Payton Doughty   Fri Mar 21, 2021  5:42 PM) #180/90 days filled 02/18/2021 [facility]  metoprolol tartrate (LOPRESSOR) 100 MG tablet [id number] Yes Take 1 tablet (100 mg total) by mouth 2 (two) times daily. Appointment Required For Further Refills [phone] Debbe Odea, MD Taking Active Family Member  montelukast (SINGULAIR) 10 MG tablet [id number] Yes TAKE 1 TABLET BY MOUTH EVERYDAY AT BEDTIME  Patient taking differently: Take 10 mg by mouth at bedtime.   Collene Gobble, MD Taking Active Multiple Informants           Med Note Orvan Seen, Sharlette Dense   Fri Mar 21, 2021  5:44 PM) #90 filled [facility]/10/2021 [facility]  nitroGLYCERIN (NITROSTAT) 0.4 MG SL tablet [id number] Yes Place 1 tablet (0.4 mg total)  under the tongue every 5 (five) minutes as needed for chest pain. Burnell Blanks, MD Taking Active Family Member           Med Note Payton Doughty   Fri Mar 21, 2021  5:39 PM) #25 filled 03/03/2021 [facility]  Oxycodone HCl 10 MG TABS [id number] Yes Take 1 tablet (10 mg total) by mouth 4 (four) times daily as needed.  Patient taking differently: Take 10 mg by mouth 4 (four) times daily as needed. Taking 8m twice daily   TBayard Hugger NP Taking Active   OXYGEN [id number] Inhale 3 L into the lungs continuous. [provider] Taking Active Family Member  pantoprazole (PROTONIX) 40 MG tablet [id number] Take 1 tablet (40 mg total) by mouth daily. RCheryln Manly NP Taking Active Family Member           Med Note (Payton Doughty  Fri Mar 21, 2021  5:32 PM) #30 filled [facility]/17/[facility]21 [facility]  phenazopyridine (PYRIDIUM) 100 MG tablet [id number] Take 1 tablet (100 mg total) by mouth 3 (three) times daily with meals.  Patient not taking: Reported on 05/07/2021   PFlora Lipps MD Not Taking Active   predniSONE (DELTASONE) 5 MG tablet [id number] Take 5 mg by mouth every morning. [provider] Taking Active Family Member           Med Note (Orvan Seen HSharlette Dense  Fri Mar 21, 2021  5:34 PM) Continuous - #90 filled 03/09/2021 [facility]  promethazine (PHENERGAN) 25 MG tablet [id number] Take 1 tablet (25 mg total) by mouth at bedtime as needed for nausea. For nausea and sleep GDwyane Dee MD Taking Active Family Member  Med Note Orvan Seen, HEATHER L   Fri Mar 21, 2021  5:32 PM) #120/30 days filled 03/17/2021 [facility]  rosuvastatin (CRESTOR) 40 MG tablet [id number] Yes Take 1 tablet (40 mg total) by mouth daily.  Patient taking differently: Take 20 mg by mouth daily.   Edwin Dada, MD Taking Active Family Member           Med Note Payton Doughty    Fri Mar 21, 2021  5:47 PM) #30 filled 02/03/2021 [facility] per external pharmacy records  sertraline (ZOLOFT) 100 MG tablet [id number] Yes Take 100 mg by mouth every morning. [provider] Taking Active Family Member           Med Note Payton Doughty   Fri Mar 21, 2021  5:35 PM) #180/90 days filled [facility]/09/2021 [facility]            Patient Active Problem List   Diagnosis Date Noted   Nausea & vomiting 04/19/2021   Acute kidney injury ([place]) 04/19/2021   Viral syndrome 03/21/2021   Pure hypercholesterolemia    Coronary stent restenosis due to scar tissue    Unstable angina ([place]) 02/19/2021   Acute on chronic diastolic CHF (congestive heart failure) ([place]) 01/29/2021   Acute CHF (congestive heart failure) ([place]) 01/29/2021   Acute on chronic systolic CHF (congestive heart failure) ([place]) 01/29/2021   Non-ST elevation (NSTEMI) myocardial infarction [facility])    Chronic respiratory failure with hypoxia ([place]) 01/01/2021   Flu vaccine need 08/13/2020   Need for pneumococcal vaccination 08/13/2020   Visit for screening mammogram 08/13/2020   Candidal skin infection 08/13/2020   GAD (generalized anxiety disorder) 08/13/2020   Chronic bronchitis, mucopurulent ([place]) 08/24/2019   Current chronic use of systemic steroids 07/23/2019   OSA (obstructive sleep apnea)    Severe episode of recurrent major depressive disorder, with psychotic features ([place]) 02/16/2019   Obstructive chronic bronchitis without exacerbation ([place]) 11/03/2018   Long term (current) use of anticoagulants 04/28/2018   Renal transplant, status post 01/28/2018   LBBB (left bundle branch block) 10/07/2017   Primary osteoarthritis of right knee 08/18/2017   Shortness of breath 06/18/2017   Mild aortic stenosis by prior echocardiogram 01/28/2017   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   Deficiency anemia 10/14/2016   Normocytic anemia 10/13/2016   Fibromyalgia 03/30/2016   Other  spondylosis with radiculopathy, lumbar region 03/30/2016   Type 2 diabetes mellitus with peripheral neuropathy ([place]) 03/30/2016   Paroxysmal atrial fibrillation ([place]); CHA2DS2VASc score F, HTN, CAD, CVA = 5 06/28/2014   Hypertension associated with diabetes ([place])    Chest pain 08/23/2013   PAD (peripheral artery disease) ([place]) 08/17/2013   Stenosis of right carotid artery without infarction 10/08/2012   Dyslipidemia, goal LDL below 70 10/08/2012    Class: Diagnosis of   Mitral annular calcification 10/08/2012   Hypomagnesemia 06/13/2012   Renal transplant disorder 06/12/2012   Chronic allergic rhinitis 04/29/2011   Extrinsic asthma 09/09/2007   GERD 09/09/2007   COUGH, CHRONIC 09/09/2007   Morbid obesity - s/p Lap Band 9/'05 05/07/2004    Class: Diagnosis of   Hx of CABG 09/07/1993    Class: History of   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 07/1993   CAD in native artery 07/1993   ESRD (end stage renal disease) ([place]) 1991    Immunization History  Administered Date(s) Administered   Influenza Split 05/11/2012, 05/17/2016, 05/17/2017   Influenza Whole  06/17/2009, 06/17/2010, 04/18/2011   Influenza,inj,Quad PF,6+ Mos 08/24/2013, 05/20/2015, 04/21/2018, 04/01/2019, 08/13/2020   PFIZER Comirnaty([place])Covid-19 Tri-Sucrose Vaccine 12/30/2019   Pneumococcal Conjugate-13 08/13/2020   Pneumococcal Polysaccharide-23 11/30/2017   Tdap 11/30/2017   Zoster Recombinat (Shingrix) 07/04/2018, 03/08/2019    Conditions to be addressed/monitored:  Hypertension, Hyperlipidemia, Diabetes, Atrial Fibrillation, Heart Failure, Coronary Artery Disease, GERD, COPD, Chronic Kidney Disease, Depression, Anxiety, Osteoarthritis, Fibromyalgia and Allergic Rhinitis  Care Plan : CCM Care Plan  Updates made by Tomasa Blase, RPH since 05/07/2021 12:00 AM     Problem: Hypertension, Hyperlipidemia, Diabetes, Atrial Fibrillation, Heart Failure, Coronary Artery Disease,  GERD, COPD, Chronic Kidney Disease, Depression, Anxiety, Osteoarthritis, Fibromyalgia and Allergic Rhinitis   Priority: High  [place] Date: 05/07/2021     Long-Range Goal: Disease Management   Start Date: 05/07/2021  Expected End Date: 11/04/2021  This Visit's Progress: On track  Priority: High  Note:   Current Barriers:  Unable to independently monitor therapeutic efficacy Does not adhere to prescribed medication regimen Does not maintain contact with provider office Does not contact provider office for questions/concerns  Pharmacist Clinical Goal(s):  Patient will achieve adherence to monitoring guidelines and medication adherence to achieve therapeutic efficacy adhere to prescribed medication regimen as evidenced by no missed doses of prescribed medications  contact provider office for questions/concerns as evidenced notation of same in electronic health record through collaboration with PharmD and provider.   Interventions: 1:1 collaboration with [name], MD regarding development and update of comprehensive plan of care as evidenced by provider attestation and co-signature Inter-disciplinary care team collaboration (see longitudinal plan of care) Comprehensive medication review performed; medication list updated in electronic medical record  Hyperlipidemia/ CAD/ History of NSTEMI: (LDL goal < 70) -Controlled Lab Results  Component Value Date   [place] 15 02/20/2021  -Current treatment: Rosuvastatin 67m daily  Clopidogrel 715m- 1 tablet daily  Nitroglycerin 0.97m73m 1 tablet every 5 minutes x 3 doses as needed for chest pain -Medications previously tried: pravastatin   -Current dietary patterns: unable to discuss during this appointment  -Current exercise habits: limited due to her chronic pain  -Educated on Cholesterol goals;  Benefits of statin for ASCVD risk reduction; Importance of limiting foods high in cholesterol; -Counseled on diet and exercise  extensively Recommended to continue current medication  Diabetes (A1c goal <7%) -Not ideally controlled Lab Results  Component Value Date   HGBA1C 9.1 (H) 01/31/2021  -Current medications: Novolog - 14 units 3 times daily with meals  Basaglar - 12 units once daily  Glimepiride 97mg17m1 tablet daily  Metformin 500mg62m tablet twice daily  -Medications previously tried: lantus,   -Current home glucose readings fasting glucose: patient has not been checking blood sugars  -Reports hypoglycemic/hyperglycemic symptoms that can occur on days that she does not eat as much as she typically does  -Current meal patterns:  Unable to address in appointment today -Current exercise: limited due to chronic pain -Educated on A1c and blood sugar goals; Complications of diabetes including kidney damage, retinal damage, and cardiovascular disease; Exercise goal of 150 minutes per week; Benefits of weight loss; Proper insulin injection technique; Prevention and management of hypoglycemic episodes; Benefits of routine self-monitoring of blood sugar; -Counseled to check feet daily and get yearly eye exams -Counseled on diet and exercise extensively Recommended for patient to start checking blood sugars at least 3 times a day with meals - patient would qualify for CGM but will need an updated PCP visit  for CGM order -Adjustments to medications can be determined once blood sugar average is known   Atrial Fibrillation (Goal: prevent stroke and major bleeding) -Controlled -CHADSVASC: 5 -Current treatment: Rate control: Dofetilide (Tikosyn) 229mg - 1 capsule twice daily, Diltiazem 369m- 1 tablet every 4 hours as needed (HR >100 bpm) Metoprolol Tartrate 10021m 1 tablet twice daily  Anticoagulation: Eliquis 5mg12m1 tablet twice daily  -Medications previously tried: diltiazem CD 120mg80mHome BP and HR readings: reports that blood pressures are typically averaging 130/80 with HR anywhere from 57-70bpm   -Counseled on increased risk of stroke due to Afib and benefits of anticoagulation for stroke prevention; importance of adherence to anticoagulant exactly as prescribed; bleeding risk associated with Eliquis and importance of self-monitoring for signs/symptoms of bleeding; avoidance of NSAIDs due to increased bleeding risk with anticoagulants; importance of regular laboratory monitoring; seeking medical attention after a head injury or if there is blood in the urine/stool; Afib action plan reviewed -Recommended to continue current medication  Heart Failure (Goal: manage symptoms and prevent exacerbations)/ Hypertension (BP goal <140/90) -Not ideally controlled -Last ejection fraction: 50-60% (Date: 01/28/2017) -HF type: Combined Systolic and Diastolic -NYHA Class: III (marked limitation of activity) -Current treatment: Metoprolol Tartrate 100mg 14mtablet twice daily  Isosorbide Mononitrate 60mg -36mablet daily  Furosemide 40mg - 56mblet daily - not currently taking  -Medications previously tried: 01/30/2021  -Current home BP/HR readings: reports that blood pressures are typically averaging 130/80 with HR anywhere from 57-70bpm  -Current dietary habits: unable to be discussed with appointment today, patient reports very limited and small appetite  -Current exercise habits: limited due to chronic pain -Educated on Benefits of medications for managing symptoms and prolonging life Importance of weighing daily; if you gain more than 3 pounds in one day or 5 pounds in one week, make office aware  Proper diuretic administration and potassium supplementation Importance of blood pressure control -Recommended for patient to restart furosemide  - patient agreeable to restart at 20mg dai48m COPD (Goal: control symptoms and prevent exacerbations) -Controlled -Current treatment  Arformoterol (Brovana) 15mcg/2mL47m nebulization twice daily (uses with budesonide) Budesonide 0.5mg/2mL - 82mnebulization twice daily (uses with brovana)  Albuterol 108mcg/act H86mnhaler - 2 puffs every 6 hours as needed  - using twice daily  Montelukast 10mg - 1 tab50mdaily  Oxygen - 3L continously  -Medications previously tried: xopenex, lonhala magnair symbicort,   -Gold Grade: Gold 2 (FEV1 50-79%) -Pulmonary function testing: 10/19/2017 -Exacerbations requiring treatment in last 6 months: n/a -Patient reports consistent use of maintenance inhaler -Frequency of rescue inhaler use: 2 times daily  -Counseled on Proper inhaler technique; Benefits of consistent maintenance inhaler use When to use rescue inhaler Differences between maintenance and rescue inhalers -Recommended to continue current medication  Depression/Anxiety (Goal: Promotion of positive mood) -Not ideally controlled - patient tearful at times throughout appointment due to levels of fatigue / forgetfulness that she has issues with at times  -Current treatment: Sertraline 100mg - 1 tabl80maily  Lorazepam 1mg - 1 tablet81mice daily  Duloxetine 60mg - 1 tablet57mly (being used for fibromyalgia) -Medications previously tried/failed: wellbutrin, abilify, viibryd -PHQ9: unable to assess at appointment  -Educated on Benefits of medication for symptom control Benefits of cognitive-behavioral therapy with or without medication -Recommended to continue current medication  Allergic Rhinitis (Goal: Prevention/ treatment of allergy attacks) -Controlled -Current treatment  Cetirizine 5mg - 1 tablet d78my  Fluticasone 50mcg/act -69m  2 puffs into each nostril daily  -Medications previously tried: nasocort   -Recommended to continue current medication  CKD / kidney transplant (Goal: Prevention of disease progression / prevention of rejection of transplant) -Controlled -Last eGFR 59 mL/min - last BMP WNL -Current treatment  Azathioprine 32m - 1230mdaily  Calcitriol 0.2535m- 1 capsule 3 times weekly Prednisone 5mg83m1 tablet daily   -Medications previously tried: n/a  -Recommended to continue current medication  GERD (Goal: Prevention/ control of acid reflux) -Controlled -Current treatment  Pantoprazole 40mg87m tablet daily  -Medications previously tried: omeprazole   -Counseled on diet and exercise extensively Recommended to continue current medication   Chronic Pain / Fibromyalgia (Goal: Pain control) - Follows with Dr. SchwaTessa Lernertrolled -Current treatment  Oxycodone 10mg 32mtablet 3 times daily  Acetaminophen 500mg -51mablets twice daily  Duloxetine 60mg - 69mpsule daily  Lamotrigine 25mg - 160mlet at bedtime  -Medications previously tried: carbamazepine, butrans, norco, percocet, tramadol -Recommended to continue current medication Counseled on potential additive side effects of medications that could be contributing to drowsiness, advised for patient to trial reducing oxycodone dose to 1 tablet every 6 hours as needed in hopes of being able to control pain on a lower daily dose of oxycodone than she is currently taking   Duloxetine/ sertraline combination has risk of serotonin syndrome, has been trialed on numerous antidepressants, will continue to work with patient to try to wean down on duplicate therapy but for now will continue   Patient Goals/Self-Care Activities Patient will:  - take medications as prescribed focus on medication adherence by taking all medications as prescribed check glucose 3-4 times daily, document, and provide at future appointments check blood pressure daily, document, and provide at future appointments weigh daily, and contact provider if weight gain of >3lbs in a day / >5lbs in a week  Follow Up Plan: Telephone follow up appointment with care management team member scheduled for: 1 month The patient has been provided with contact information for the care management team and has been advised to call with any health related questions or concerns.        Medication  Assistance: None required.  Patient affirms current coverage meets needs.  Patient's preferred pharmacy is:  [id number] - [id number], [place] - [address] [phone] box? Yes - filled and managed by family friend  Pt endorses 80-90% compliance  Care Plan and Follow Up Patient Decision:  Patient agrees to Care Plan and Follow-up.  Plan: Telephone follow up appointment with care management team member scheduled for:  1 month and The patient has been provided with contact information for the care management team and has been advised to call with any health related questions or concerns.   Jeromey Kruer C STomasa Blaselinical Pharmacist, [place]

## 2021-05-11 ENCOUNTER — Inpatient Hospital Stay (HOSPITAL_COMMUNITY)
Admission: EM | Admit: 2021-05-11 | Discharge: 2021-05-18 | DRG: 308 | Disposition: A | Discharge status: Home / Self Care | Payer: Medicare Other | Attending: Internal Medicine | Admitting: Internal Medicine

## 2021-05-11 DIAGNOSIS — I499 Cardiac arrhythmia, unspecified: Secondary | ICD-10-CM | POA: Diagnosis not present

## 2021-05-11 DIAGNOSIS — I493 Ventricular premature depolarization: Secondary | ICD-10-CM | POA: Diagnosis present

## 2021-05-11 DIAGNOSIS — J9611 Chronic respiratory failure with hypoxia: Secondary | ICD-10-CM | POA: Diagnosis present

## 2021-05-11 DIAGNOSIS — N179 Acute kidney failure, unspecified: Secondary | ICD-10-CM | POA: Diagnosis not present

## 2021-05-11 DIAGNOSIS — K219 Gastro-esophageal reflux disease without esophagitis: Secondary | ICD-10-CM | POA: Diagnosis present

## 2021-05-11 DIAGNOSIS — I480 Paroxysmal atrial fibrillation: Secondary | ICD-10-CM | POA: Diagnosis present

## 2021-05-11 DIAGNOSIS — R002 Palpitations: Secondary | ICD-10-CM | POA: Diagnosis not present

## 2021-05-11 DIAGNOSIS — E785 Hyperlipidemia, unspecified: Secondary | ICD-10-CM | POA: Diagnosis present

## 2021-05-11 DIAGNOSIS — I132 Hypertensive heart and chronic kidney disease with heart failure and with stage 5 chronic kidney disease, or end stage renal disease: Secondary | ICD-10-CM | POA: Diagnosis present

## 2021-05-11 DIAGNOSIS — I4721 Torsades de pointes: Principal | ICD-10-CM | POA: Diagnosis present

## 2021-05-11 DIAGNOSIS — F419 Anxiety disorder, unspecified: Secondary | ICD-10-CM | POA: Diagnosis present

## 2021-05-11 DIAGNOSIS — Z951 Presence of aortocoronary bypass graft: Secondary | ICD-10-CM

## 2021-05-11 DIAGNOSIS — I471 Supraventricular tachycardia: Secondary | ICD-10-CM | POA: Diagnosis present

## 2021-05-11 DIAGNOSIS — Z20822 Contact with and (suspected) exposure to covid-19: Secondary | ICD-10-CM | POA: Diagnosis not present

## 2021-05-11 DIAGNOSIS — Z7984 Long term (current) use of oral hypoglycemic drugs: Secondary | ICD-10-CM

## 2021-05-11 DIAGNOSIS — D631 Anemia in chronic kidney disease: Secondary | ICD-10-CM | POA: Diagnosis present

## 2021-05-11 DIAGNOSIS — I472 Ventricular tachycardia: Secondary | ICD-10-CM | POA: Diagnosis not present

## 2021-05-11 DIAGNOSIS — F32A Depression, unspecified: Secondary | ICD-10-CM | POA: Diagnosis present

## 2021-05-11 DIAGNOSIS — Z955 Presence of coronary angioplasty implant and graft: Secondary | ICD-10-CM

## 2021-05-11 DIAGNOSIS — Z87442 Personal history of urinary calculi: Secondary | ICD-10-CM

## 2021-05-11 DIAGNOSIS — E876 Hypokalemia: Secondary | ICD-10-CM | POA: Diagnosis not present

## 2021-05-11 DIAGNOSIS — Z8614 Personal history of Methicillin resistant Staphylococcus aureus infection: Secondary | ICD-10-CM

## 2021-05-11 DIAGNOSIS — T8619 Other complication of kidney transplant: Secondary | ICD-10-CM | POA: Diagnosis present

## 2021-05-11 DIAGNOSIS — Z809 Family history of malignant neoplasm, unspecified: Secondary | ICD-10-CM

## 2021-05-11 DIAGNOSIS — I152 Hypertension secondary to endocrine disorders: Secondary | ICD-10-CM | POA: Diagnosis present

## 2021-05-11 DIAGNOSIS — R079 Chest pain, unspecified: Secondary | ICD-10-CM | POA: Diagnosis not present

## 2021-05-11 DIAGNOSIS — I252 Old myocardial infarction: Secondary | ICD-10-CM

## 2021-05-11 DIAGNOSIS — I4729 Other ventricular tachycardia: Secondary | ICD-10-CM

## 2021-05-11 DIAGNOSIS — G9341 Metabolic encephalopathy: Secondary | ICD-10-CM | POA: Diagnosis not present

## 2021-05-11 DIAGNOSIS — Z8673 Personal history of transient ischemic attack (TIA), and cerebral infarction without residual deficits: Secondary | ICD-10-CM

## 2021-05-11 DIAGNOSIS — G4733 Obstructive sleep apnea (adult) (pediatric): Secondary | ICD-10-CM | POA: Diagnosis present

## 2021-05-11 DIAGNOSIS — I5032 Chronic diastolic (congestive) heart failure: Secondary | ICD-10-CM | POA: Diagnosis present

## 2021-05-11 DIAGNOSIS — I4819 Other persistent atrial fibrillation: Secondary | ICD-10-CM | POA: Diagnosis present

## 2021-05-11 DIAGNOSIS — Z23 Encounter for immunization: Secondary | ICD-10-CM

## 2021-05-11 DIAGNOSIS — E1169 Type 2 diabetes mellitus with other specified complication: Secondary | ICD-10-CM | POA: Diagnosis present

## 2021-05-11 DIAGNOSIS — Z8679 Personal history of other diseases of the circulatory system: Secondary | ICD-10-CM

## 2021-05-11 DIAGNOSIS — I25810 Atherosclerosis of coronary artery bypass graft(s) without angina pectoris: Secondary | ICD-10-CM | POA: Diagnosis present

## 2021-05-11 DIAGNOSIS — E1142 Type 2 diabetes mellitus with diabetic polyneuropathy: Secondary | ICD-10-CM | POA: Diagnosis present

## 2021-05-11 DIAGNOSIS — Z8249 Family history of ischemic heart disease and other diseases of the circulatory system: Secondary | ICD-10-CM

## 2021-05-11 DIAGNOSIS — I700 Atherosclerosis of aorta: Secondary | ICD-10-CM | POA: Diagnosis present

## 2021-05-11 DIAGNOSIS — I959 Hypotension, unspecified: Secondary | ICD-10-CM | POA: Diagnosis not present

## 2021-05-11 DIAGNOSIS — I517 Cardiomegaly: Secondary | ICD-10-CM | POA: Diagnosis not present

## 2021-05-11 DIAGNOSIS — E1122 Type 2 diabetes mellitus with diabetic chronic kidney disease: Secondary | ICD-10-CM | POA: Diagnosis present

## 2021-05-11 DIAGNOSIS — Z7902 Long term (current) use of antithrombotics/antiplatelets: Secondary | ICD-10-CM

## 2021-05-11 DIAGNOSIS — F17210 Nicotine dependence, cigarettes, uncomplicated: Secondary | ICD-10-CM | POA: Diagnosis present

## 2021-05-11 DIAGNOSIS — Z794 Long term (current) use of insulin: Secondary | ICD-10-CM

## 2021-05-11 DIAGNOSIS — R739 Hyperglycemia, unspecified: Secondary | ICD-10-CM | POA: Diagnosis not present

## 2021-05-11 DIAGNOSIS — R42 Dizziness and giddiness: Secondary | ICD-10-CM | POA: Diagnosis not present

## 2021-05-11 DIAGNOSIS — R000 Tachycardia, unspecified: Secondary | ICD-10-CM | POA: Diagnosis not present

## 2021-05-11 DIAGNOSIS — M797 Fibromyalgia: Secondary | ICD-10-CM | POA: Diagnosis present

## 2021-05-11 DIAGNOSIS — E1151 Type 2 diabetes mellitus with diabetic peripheral angiopathy without gangrene: Secondary | ICD-10-CM | POA: Diagnosis present

## 2021-05-11 DIAGNOSIS — M069 Rheumatoid arthritis, unspecified: Secondary | ICD-10-CM | POA: Diagnosis present

## 2021-05-11 DIAGNOSIS — J449 Chronic obstructive pulmonary disease, unspecified: Secondary | ICD-10-CM | POA: Diagnosis present

## 2021-05-11 DIAGNOSIS — E1143 Type 2 diabetes mellitus with diabetic autonomic (poly)neuropathy: Secondary | ICD-10-CM | POA: Diagnosis present

## 2021-05-11 DIAGNOSIS — I2510 Atherosclerotic heart disease of native coronary artery without angina pectoris: Secondary | ICD-10-CM | POA: Diagnosis present

## 2021-05-11 DIAGNOSIS — H5461 Unqualified visual loss, right eye, normal vision left eye: Secondary | ICD-10-CM | POA: Diagnosis present

## 2021-05-11 DIAGNOSIS — Z7901 Long term (current) use of anticoagulants: Secondary | ICD-10-CM

## 2021-05-11 DIAGNOSIS — R509 Fever, unspecified: Secondary | ICD-10-CM

## 2021-05-11 HISTORY — DX: Personal history of other diseases of the circulatory system: Z86.79

## 2021-05-12 ENCOUNTER — Encounter (HOSPITAL_COMMUNITY): Payer: Self-pay | Admitting: Pulmonary Disease

## 2021-05-12 ENCOUNTER — Emergency Department (HOSPITAL_COMMUNITY): Payer: Medicare Other

## 2021-05-12 ENCOUNTER — Other Ambulatory Visit: Payer: Self-pay

## 2021-05-12 DIAGNOSIS — N179 Acute kidney failure, unspecified: Secondary | ICD-10-CM | POA: Diagnosis not present

## 2021-05-12 DIAGNOSIS — I509 Heart failure, unspecified: Secondary | ICD-10-CM | POA: Diagnosis not present

## 2021-05-12 DIAGNOSIS — I517 Cardiomegaly: Secondary | ICD-10-CM | POA: Diagnosis not present

## 2021-05-12 DIAGNOSIS — G9341 Metabolic encephalopathy: Secondary | ICD-10-CM | POA: Diagnosis not present

## 2021-05-12 DIAGNOSIS — I4721 Torsades de pointes: Secondary | ICD-10-CM

## 2021-05-12 DIAGNOSIS — I152 Hypertension secondary to endocrine disorders: Secondary | ICD-10-CM | POA: Diagnosis not present

## 2021-05-12 DIAGNOSIS — R509 Fever, unspecified: Secondary | ICD-10-CM | POA: Diagnosis not present

## 2021-05-12 DIAGNOSIS — E1142 Type 2 diabetes mellitus with diabetic polyneuropathy: Secondary | ICD-10-CM

## 2021-05-12 DIAGNOSIS — I472 Ventricular tachycardia: Secondary | ICD-10-CM

## 2021-05-12 DIAGNOSIS — I132 Hypertensive heart and chronic kidney disease with heart failure and with stage 5 chronic kidney disease, or end stage renal disease: Secondary | ICD-10-CM | POA: Diagnosis not present

## 2021-05-12 DIAGNOSIS — R001 Bradycardia, unspecified: Secondary | ICD-10-CM | POA: Insufficient documentation

## 2021-05-12 DIAGNOSIS — Z794 Long term (current) use of insulin: Secondary | ICD-10-CM | POA: Insufficient documentation

## 2021-05-12 DIAGNOSIS — I480 Paroxysmal atrial fibrillation: Secondary | ICD-10-CM

## 2021-05-12 DIAGNOSIS — R079 Chest pain, unspecified: Secondary | ICD-10-CM | POA: Diagnosis not present

## 2021-05-12 DIAGNOSIS — I25810 Atherosclerosis of coronary artery bypass graft(s) without angina pectoris: Secondary | ICD-10-CM | POA: Diagnosis not present

## 2021-05-12 DIAGNOSIS — E1169 Type 2 diabetes mellitus with other specified complication: Secondary | ICD-10-CM | POA: Diagnosis present

## 2021-05-12 DIAGNOSIS — I2510 Atherosclerotic heart disease of native coronary artery without angina pectoris: Secondary | ICD-10-CM | POA: Diagnosis not present

## 2021-05-12 DIAGNOSIS — G9340 Encephalopathy, unspecified: Secondary | ICD-10-CM | POA: Insufficient documentation

## 2021-05-12 DIAGNOSIS — F32A Depression, unspecified: Secondary | ICD-10-CM | POA: Diagnosis present

## 2021-05-12 DIAGNOSIS — Z7901 Long term (current) use of anticoagulants: Secondary | ICD-10-CM | POA: Diagnosis not present

## 2021-05-12 DIAGNOSIS — I4581 Long QT syndrome: Secondary | ICD-10-CM | POA: Diagnosis not present

## 2021-05-12 DIAGNOSIS — D631 Anemia in chronic kidney disease: Secondary | ICD-10-CM | POA: Diagnosis not present

## 2021-05-12 DIAGNOSIS — E1122 Type 2 diabetes mellitus with diabetic chronic kidney disease: Secondary | ICD-10-CM | POA: Diagnosis not present

## 2021-05-12 DIAGNOSIS — J449 Chronic obstructive pulmonary disease, unspecified: Secondary | ICD-10-CM

## 2021-05-12 DIAGNOSIS — E1151 Type 2 diabetes mellitus with diabetic peripheral angiopathy without gangrene: Secondary | ICD-10-CM | POA: Diagnosis present

## 2021-05-12 DIAGNOSIS — Z23 Encounter for immunization: Secondary | ICD-10-CM | POA: Diagnosis not present

## 2021-05-12 DIAGNOSIS — T8619 Other complication of kidney transplant: Secondary | ICD-10-CM | POA: Diagnosis not present

## 2021-05-12 DIAGNOSIS — M069 Rheumatoid arthritis, unspecified: Secondary | ICD-10-CM | POA: Diagnosis present

## 2021-05-12 DIAGNOSIS — I5032 Chronic diastolic (congestive) heart failure: Secondary | ICD-10-CM | POA: Diagnosis not present

## 2021-05-12 DIAGNOSIS — I4819 Other persistent atrial fibrillation: Secondary | ICD-10-CM | POA: Diagnosis not present

## 2021-05-12 DIAGNOSIS — E1159 Type 2 diabetes mellitus with other circulatory complications: Secondary | ICD-10-CM | POA: Diagnosis not present

## 2021-05-12 DIAGNOSIS — Z940 Kidney transplant status: Secondary | ICD-10-CM | POA: Insufficient documentation

## 2021-05-12 DIAGNOSIS — E1143 Type 2 diabetes mellitus with diabetic autonomic (poly)neuropathy: Secondary | ICD-10-CM | POA: Diagnosis present

## 2021-05-12 DIAGNOSIS — J9611 Chronic respiratory failure with hypoxia: Secondary | ICD-10-CM | POA: Diagnosis present

## 2021-05-12 DIAGNOSIS — I5030 Unspecified diastolic (congestive) heart failure: Secondary | ICD-10-CM | POA: Diagnosis not present

## 2021-05-12 DIAGNOSIS — I700 Atherosclerosis of aorta: Secondary | ICD-10-CM | POA: Diagnosis present

## 2021-05-12 DIAGNOSIS — I499 Cardiac arrhythmia, unspecified: Secondary | ICD-10-CM | POA: Diagnosis not present

## 2021-05-12 DIAGNOSIS — Z20822 Contact with and (suspected) exposure to covid-19: Secondary | ICD-10-CM | POA: Diagnosis not present

## 2021-05-12 DIAGNOSIS — E1165 Type 2 diabetes mellitus with hyperglycemia: Secondary | ICD-10-CM | POA: Insufficient documentation

## 2021-05-12 DIAGNOSIS — I4729 Other ventricular tachycardia: Secondary | ICD-10-CM

## 2021-05-12 HISTORY — DX: Torsades de pointes: I47.21

## 2021-05-12 LAB — CBC
HCT: 31.7 % — ABNORMAL LOW (ref 36.0–46.0)
HCT: 31.7 % — ABNORMAL LOW (ref 36.0–46.0)
Hemoglobin: 10.1 g/dL — ABNORMAL LOW (ref 12.0–15.0)
Hemoglobin: 9.8 g/dL — ABNORMAL LOW (ref 12.0–15.0)
MCH: 29.8 pg (ref 26.0–34.0)
MCH: 30.1 pg (ref 26.0–34.0)
MCHC: 30.9 g/dL (ref 30.0–36.0)
MCHC: 31.9 g/dL (ref 30.0–36.0)
MCV: 93.5 fL (ref 80.0–100.0)
MCV: 97.2 fL (ref 80.0–100.0)
Platelets: 232 10*3/uL (ref 150–400)
Platelets: 285 10*3/uL (ref 150–400)
RBC: 3.26 MIL/uL — ABNORMAL LOW (ref 3.87–5.11)
RBC: 3.39 MIL/uL — ABNORMAL LOW (ref 3.87–5.11)
RDW: 18.2 % — ABNORMAL HIGH (ref 11.5–15.5)
RDW: 18.7 % — ABNORMAL HIGH (ref 11.5–15.5)
WBC: 3.9 10*3/uL — ABNORMAL LOW (ref 4.0–10.5)
WBC: 7.7 10*3/uL (ref 4.0–10.5)
nRBC: 0 % (ref 0.0–0.2)
nRBC: 0 % (ref 0.0–0.2)

## 2021-05-12 LAB — TROPONIN I (HIGH SENSITIVITY): Troponin I (High Sensitivity): 15 ng/L (ref <18)

## 2021-05-12 LAB — COMPREHENSIVE METABOLIC PANEL
ALT: 8 U/L (ref 0–44)
AST: 24 U/L (ref 15–41)
Albumin: 2.5 g/dL — ABNORMAL LOW (ref 3.5–5.0)
Alkaline Phosphatase: 121 U/L (ref 38–126)
Anion gap: 10 (ref 5–15)
BUN: 17 mg/dL (ref 8–23)
CO2: 20 mmol/L — ABNORMAL LOW (ref 22–32)
Calcium: 9.2 mg/dL (ref 8.9–10.3)
Chloride: 100 mmol/L (ref 98–111)
Creatinine, Ser: 1.44 mg/dL — ABNORMAL HIGH (ref 0.44–1.00)
GFR, Estimated: 41 mL/min — ABNORMAL LOW (ref >60)
Glucose, Bld: 273 mg/dL — ABNORMAL HIGH (ref 70–99)
Potassium: 4 mmol/L (ref 3.5–5.1)
Sodium: 130 mmol/L — ABNORMAL LOW (ref 135–145)
Total Bilirubin: 1 mg/dL (ref 0.3–1.2)
Total Protein: 7 g/dL (ref 6.5–8.1)

## 2021-05-12 LAB — BASIC METABOLIC PANEL
Anion gap: 9 (ref 5–15)
BUN: 14 mg/dL (ref 8–23)
CO2: 21 mmol/L — ABNORMAL LOW (ref 22–32)
Calcium: 8.7 mg/dL — ABNORMAL LOW (ref 8.9–10.3)
Chloride: 103 mmol/L (ref 98–111)
Creatinine, Ser: 1.29 mg/dL — ABNORMAL HIGH (ref 0.44–1.00)
GFR, Estimated: 47 mL/min — ABNORMAL LOW (ref >60)
Glucose, Bld: 264 mg/dL — ABNORMAL HIGH (ref 70–99)
Potassium: 4.4 mmol/L (ref 3.5–5.1)
Sodium: 133 mmol/L — ABNORMAL LOW (ref 135–145)

## 2021-05-12 LAB — GLUCOSE, CAPILLARY
Glucose-Capillary: 118 mg/dL — ABNORMAL HIGH (ref 70–99)
Glucose-Capillary: 166 mg/dL — ABNORMAL HIGH (ref 70–99)
Glucose-Capillary: 193 mg/dL — ABNORMAL HIGH (ref 70–99)
Glucose-Capillary: 208 mg/dL — ABNORMAL HIGH (ref 70–99)
Glucose-Capillary: 228 mg/dL — ABNORMAL HIGH (ref 70–99)
Glucose-Capillary: 281 mg/dL — ABNORMAL HIGH (ref 70–99)

## 2021-05-12 LAB — MAGNESIUM
Magnesium: 1.8 mg/dL (ref 1.7–2.4)
Magnesium: 3.4 mg/dL — ABNORMAL HIGH (ref 1.7–2.4)

## 2021-05-12 LAB — I-STAT ARTERIAL BLOOD GAS, ED
Acid-base deficit: 2 mmol/L (ref 0.0–2.0)
Bicarbonate: 23.8 mmol/L (ref 20.0–28.0)
Calcium, Ion: 1.24 mmol/L (ref 1.15–1.40)
HCT: 30 % — ABNORMAL LOW (ref 36.0–46.0)
Hemoglobin: 10.2 g/dL — ABNORMAL LOW (ref 12.0–15.0)
O2 Saturation: 91 %
Potassium: 3.8 mmol/L (ref 3.5–5.1)
Sodium: 135 mmol/L (ref 135–145)
TCO2: 25 mmol/L (ref 22–32)
pCO2 arterial: 42.2 mmHg (ref 32.0–48.0)
pH, Arterial: 7.359 (ref 7.350–7.450)
pO2, Arterial: 64 mmHg — ABNORMAL LOW (ref 83.0–108.0)

## 2021-05-12 LAB — MRSA NEXT GEN BY PCR, NASAL: MRSA by PCR Next Gen: NOT DETECTED

## 2021-05-12 LAB — RESP PANEL BY RT-PCR (FLU A&B, COVID) ARPGX2
Influenza A by PCR: NEGATIVE
Influenza B by PCR: NEGATIVE
SARS Coronavirus 2 by RT PCR: NEGATIVE

## 2021-05-12 LAB — HEMOGLOBIN A1C
Hgb A1c MFr Bld: 6.9 % — ABNORMAL HIGH (ref 4.8–5.6)
Mean Plasma Glucose: 151.33 mg/dL

## 2021-05-12 LAB — AMMONIA: Ammonia: 22 umol/L (ref 9–35)

## 2021-05-12 LAB — PHOSPHORUS: Phosphorus: 2.8 mg/dL (ref 2.5–4.6)

## 2021-05-12 LAB — ETHANOL: Alcohol, Ethyl (B): <10 mg/dL (ref <10)

## 2021-05-12 MED ORDER — CHLORHEXIDINE GLUCONATE CLOTH 2 % EX PADS
6.0000 | MEDICATED_PAD | Freq: Every day | CUTANEOUS | Status: DC
  Administered 2021-05-12 – 2021-05-18 (×7): 6 via TOPICAL

## 2021-05-12 MED ORDER — ACETAMINOPHEN 325 MG PO TABS
650.0000 mg | ORAL_TABLET | ORAL | Status: DC | PRN
  Administered 2021-05-12 – 2021-05-17 (×10): 650 mg via ORAL
  Filled 2021-05-12 (×10): qty 2

## 2021-05-12 MED ORDER — BUDESONIDE 0.25 MG/2ML IN SUSP: 0.2500 mg | Freq: Two times a day (BID) | RESPIRATORY_TRACT | Status: DC

## 2021-05-12 MED ORDER — FLUTICASONE PROPIONATE 50 MCG/ACT NA SUSP: 2.0000 | Freq: Two times a day (BID) | NASAL | Status: DC

## 2021-05-12 MED ORDER — LORATADINE 10 MG PO TABS
10.0000 mg | ORAL_TABLET | Freq: Every day | ORAL | Status: DC
  Administered 2021-05-12 – 2021-05-18 (×7): 10 mg via ORAL
  Filled 2021-05-12 (×7): qty 1

## 2021-05-12 MED ORDER — MAGNESIUM OXIDE -MG SUPPLEMENT 400 (240 MG) MG PO TABS
400.0000 mg | ORAL_TABLET | Freq: Every day | ORAL | Status: DC
  Administered 2021-05-12 – 2021-05-18 (×7): 400 mg via ORAL
  Filled 2021-05-12 (×7): qty 1

## 2021-05-12 MED ORDER — IPRATROPIUM-ALBUTEROL 0.5-2.5 (3) MG/3ML IN SOLN
3.0000 mL | Freq: Four times a day (QID) | RESPIRATORY_TRACT | Status: DC
  Administered 2021-05-12 (×3): 3 mL via RESPIRATORY_TRACT
  Filled 2021-05-12 (×3): qty 3

## 2021-05-12 MED ORDER — APIXABAN 5 MG PO TABS
5.0000 mg | ORAL_TABLET | Freq: Two times a day (BID) | ORAL | Status: DC
  Administered 2021-05-12 – 2021-05-18 (×13): 5 mg via ORAL
  Filled 2021-05-12 (×13): qty 1

## 2021-05-12 MED ORDER — CLOPIDOGREL BISULFATE 75 MG PO TABS
75.0000 mg | ORAL_TABLET | Freq: Every day | ORAL | Status: DC
  Administered 2021-05-12 – 2021-05-18 (×7): 75 mg via ORAL
  Filled 2021-05-12 (×7): qty 1

## 2021-05-12 MED ORDER — DOCUSATE SODIUM 100 MG PO CAPS: 100.0000 mg | ORAL_CAPSULE | Freq: Two times a day (BID) | ORAL | Status: DC | PRN

## 2021-05-12 MED ORDER — POLYETHYLENE GLYCOL 3350 17 G PO PACK
17.0000 g | PACK | Freq: Every day | ORAL | Status: DC | PRN
  Filled 2021-05-12: qty 1

## 2021-05-12 MED ORDER — FAMOTIDINE 20 MG PO TABS
20.0000 mg | ORAL_TABLET | Freq: Every day | ORAL | Status: DC
  Administered 2021-05-12 – 2021-05-18 (×7): 20 mg via ORAL
  Filled 2021-05-12 (×7): qty 1

## 2021-05-12 MED ORDER — OXYCODONE HCL 5 MG PO TABS
5.0000 mg | ORAL_TABLET | Freq: Four times a day (QID) | ORAL | Status: DC | PRN
  Administered 2021-05-12 – 2021-05-18 (×13): 10 mg via ORAL
  Filled 2021-05-12 (×13): qty 2

## 2021-05-12 MED ORDER — INFLUENZA VAC SPLIT QUAD 0.5 ML IM SUSY
0.5000 mL | PREFILLED_SYRINGE | INTRAMUSCULAR | Status: AC
  Administered 2021-05-13: 0.5 mL via INTRAMUSCULAR
  Filled 2021-05-12: qty 0.5

## 2021-05-12 MED ORDER — MAGNESIUM SULFATE 2 GM/50ML IV SOLN
2.0000 g | Freq: Once | INTRAVENOUS | Status: AC
  Administered 2021-05-12: 2 g via INTRAVENOUS
  Filled 2021-05-12: qty 50

## 2021-05-12 MED ORDER — BUDESONIDE 0.5 MG/2ML IN SUSP
2.0000 mL | Freq: Two times a day (BID) | RESPIRATORY_TRACT | Status: DC
  Administered 2021-05-12 – 2021-05-18 (×13): 0.5 mg via RESPIRATORY_TRACT
  Filled 2021-05-12 (×13): qty 2

## 2021-05-12 MED ORDER — LORAZEPAM 1 MG PO TABS
1.0000 mg | ORAL_TABLET | Freq: Two times a day (BID) | ORAL | Status: DC
  Administered 2021-05-12 – 2021-05-18 (×13): 1 mg via ORAL
  Filled 2021-05-12 (×13): qty 1

## 2021-05-12 MED ORDER — CALCITRIOL 0.25 MCG PO CAPS
0.2500 ug | ORAL_CAPSULE | ORAL | Status: DC
  Administered 2021-05-12 – 2021-05-18 (×3): 0.25 ug via ORAL
  Filled 2021-05-12 (×3): qty 1

## 2021-05-12 MED ORDER — DOPAMINE-DEXTROSE 3.2-5 MG/ML-% IV SOLN
0.0000 ug/kg/min | INTRAVENOUS | Status: DC
  Administered 2021-05-12: 5 ug/kg/min via INTRAVENOUS
  Filled 2021-05-12: qty 250

## 2021-05-12 MED ORDER — MONTELUKAST SODIUM 10 MG PO TABS
10.0000 mg | ORAL_TABLET | Freq: Every day | ORAL | Status: DC
  Administered 2021-05-12 – 2021-05-17 (×6): 10 mg via ORAL
  Filled 2021-05-12 (×7): qty 1

## 2021-05-12 MED ORDER — ROSUVASTATIN CALCIUM 20 MG PO TABS
20.0000 mg | ORAL_TABLET | Freq: Every day | ORAL | Status: DC
  Administered 2021-05-12 – 2021-05-18 (×7): 20 mg via ORAL
  Filled 2021-05-12 (×7): qty 1

## 2021-05-12 MED ORDER — LAMOTRIGINE 25 MG PO TABS: 25.0000 mg | ORAL_TABLET | Freq: Every day | ORAL | Status: DC

## 2021-05-12 MED ORDER — ARFORMOTEROL TARTRATE 15 MCG/2ML IN NEBU
15.0000 ug | INHALATION_SOLUTION | Freq: Two times a day (BID) | RESPIRATORY_TRACT | Status: DC
  Administered 2021-05-12: 15 ug via RESPIRATORY_TRACT
  Filled 2021-05-12: qty 2

## 2021-05-12 MED ORDER — PREDNISONE 10 MG PO TABS
5.0000 mg | ORAL_TABLET | Freq: Every morning | ORAL | Status: DC
  Administered 2021-05-12 – 2021-05-18 (×7): 5 mg via ORAL
  Filled 2021-05-12 (×7): qty 1

## 2021-05-12 MED ORDER — NITROGLYCERIN 0.4 MG SL SUBL: 0.4000 mg | SUBLINGUAL_TABLET | SUBLINGUAL | Status: DC | PRN

## 2021-05-12 MED ORDER — AZATHIOPRINE 50 MG PO TABS
125.0000 mg | ORAL_TABLET | Freq: Every day | ORAL | Status: DC
  Administered 2021-05-12 – 2021-05-18 (×7): 125 mg via ORAL
  Filled 2021-05-12 (×7): qty 3

## 2021-05-12 MED ORDER — INSULIN ASPART 100 UNIT/ML IJ SOLN
0.0000 [IU] | INTRAMUSCULAR | Status: DC
  Administered 2021-05-12: 2 [IU] via SUBCUTANEOUS
  Administered 2021-05-12: 1 [IU] via SUBCUTANEOUS
  Administered 2021-05-12: 2 [IU] via SUBCUTANEOUS
  Administered 2021-05-12: 3 [IU] via SUBCUTANEOUS
  Administered 2021-05-12 – 2021-05-13 (×2): 1 [IU] via SUBCUTANEOUS
  Administered 2021-05-13: 2 [IU] via SUBCUTANEOUS

## 2021-05-12 NOTE — Consult Note (Addendum)
Cardiology Consult    Patient ID: Katherine [name] MRN: [id number], DOB/AGE: 04-01-1956   Admit date: 05/11/2021 Date of Consult: 05/12/2021 Requesting Provider: Ripley Fraise, MD  PCP:  [name], MD   [facility] Providers Cardiologist:  [name], MD   {  Patient Profile    Katherine [name] is a 65 y.o. 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 She apparently called EMS due to recurrent syncope with WCT captured on pre-hospital rhythm strip. Cardiology consulted for further evaluation.   History of Present Illness    Patient unable to provide any significant history, cannot complete a sentence to explain why she called EMS. Mainly only able to answer yes/no questions but not appropriately and with conflicting responses. She is able to endorse that she did experience at least once syncopal event today, but unable to provide any further detail. History obtained from chart.  Apparently called EMS due to multiple syncopal episodes throughout the day today preceded by palpitations. At one point endorsed chest pain and dyspnea - she does confirm with me that dyspnea is worse than usual but not consistently endorsing acute chest pain today. EMS reported frequent PVCs with spontaneously-terminating runs of WCT while en route. ECG on arrival shows AF with rate of 51 and Qtc of 560. No interventions thus far.  Past Medical History   Past Medical History:  Diagnosis Date   Anemia    Anxiety    Bilateral carotid artery stenosis    Carotid duplex 11/2351: 6-14% LICA, 43-15% RICA, >40% RECA, f/u 1 yr suggested   CAD (coronary artery disease) of bypass graft 5/01; 3/'02, 8/'03, 10/'04; 1/15    PCI x 5 to SVG-D1    CAD in native artery 07/1993   3 Vessel Disease (LAD-D1 & RCA) -- CABG (Dx in setting of inferior STEMI-PTCA of RCA)   CAD S/P percutaneous coronary angioplasty    PCI to SVG-D1 insertion/native D1 x 4 = '01 -(S660 BMS 2.5 x 9 anastomosis- D1); '02 - distal overlap ACS Pixel 2.5 x 8  BMS; '03 distal/native ISR/Thrombosis - Pixel 2.5 x 13; '04 - ISR-  Taxus 2.5 x 20 (covered all);; 1/15 - mid SVG-D1 (50% distal ISR) - Promus P 2.75 x 20 -- 2.8 mm   COPD mixed type (HCC)    Followed by Dr. Lamonte Sakai "pulmonologist said no COPD"   Depression    Depression with anxiety    Diabetes mellitus type 2 in obese ([place])    Diarrhea    started after cholecystectomy and mass removed from intestine   Dyslipidemia, goal LDL below 70    08/2012: TC 137, TG 200, HDL 32!, LDL 45; on statin (followed by Dr.Deterding)   ESRD (end stage renal disease) ([place]) 1991   s/p [facility]ada[facility]ic Renal Transplant [name] - Dr. Jimmy Footman)    Family history of adverse reaction to anesthesia    mom's bp dropped during/after anesthesia   Fibromyalgia    GERD (gastroesophageal reflux disease)    Glomerulonephritis, chronic, rapidly progressive 1989   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 07/1993   Rescue PTCA of RCA -- referred for CABG.   H/O: GI bleed  Headache    migraines in the past   History of CABG x 3 08/1993   Dr. Servando Snare: LIMA-LAD, SVG-bifurcatingD1, SVG-rPDA   History of kidney stones    History of stroke 2012   "right eye stroke- half blind now"   Hypertension associated with diabetes ([place])    Mild aortic stenosis by prior echocardiogram 07/2019   Echo:  Mild aortic stenosis (gradients: Mean 14.3 mmHg -peak 24.9 mmHg).   Morbid obesity (HCC)    MRSA (methicillin resistant staph aureus) culture positive    OSA (obstructive sleep apnea)    no longer on CPAP or home O2, states she doesn't need now after lap band   PAD (peripheral artery disease) ([place]) 08/2013   LEA  Dopplers to be read by Dr. Fletcher Anon   PAF (paroxysmal atrial fibrillation) ([place]) 06/2014   Noted on CardioNet Monitor  - --> rhythm control with Tikosyn (Dr. Rayann Heman); converted from warfarin to apixaban for anticoagulation.   Pneumonia    Recurrent boils    Bilateral Groin   Rheumatoid arthritis ([place])    Per Patient Report; associated with OA   S/p cadaver renal transplant 1991   [facility]   Unstable angina ([place]) 5/01; 3/'02, 8/'03, 10/'04; 1/15   x 5 occurences since Inf-Post STEMI in 1994    Past Surgical History:  Procedure Laterality Date   ABDOMINAL AORTOGRAM N/A 04/21/2018   Procedure: ABDOMINAL AORTOGRAM;  Surgeon: [name], MD;  Location: [place] [facility];  Service: Cardiovascular;  Laterality: N/A;   CATHETER REMOVAL     CHOLECYSTECTOMY N/A 10/29/2014   Procedure: LAPAROSCOPIC CHOLECYSTECTOMY WITH INTRAOPERATIVE CHOLANGIOGRAM;  Surgeon: Excell Seltzer, MD;  Location: [facility];  Service: General;  Laterality: N/A;   CORONARY ANGIOPLASTY  1994   x5   CORONARY ARTERY BYPASS GRAFT  1995   LIMA-LAD, SVG-RPDA, SVG-D1   CORONARY STENT INTERVENTION N/A 02/20/2021   Procedure: CORONARY STENT INTERVENTION;  Surgeon: [name], MD;  Location: [place] [facility];  Service: Cardiovascular;  Laterality: N/A;   ESOPHAGOGASTRODUODENOSCOPY N/A 10/15/2016   Procedure: ESOPHAGOGASTRODUODENOSCOPY (EGD);  Surgeon: Wilford Corner, MD;  Location: [facility];  Service: Endoscopy;  Laterality: N/A;   I & D EXTREMITY Right 01/29/2018   Procedure: IRRIGATION AND DEBRIDEMENT THUMB;  Surgeon: Dayna Barker, MD;  Location: [place];  Service: Plastics;  Laterality: Right;   INCISE AND DRAIN ABCESS     INTRAVASCULAR PRESSURE WIRE/FFR STUDY N/A 02/20/2021   Procedure: INTRAVASCULAR PRESSURE WIRE/FFR STUDY;  Surgeon: [name], MD;  Location: [place] [facility];  Service: Cardiovascular;  Laterality: N/A;   KIDNEY TRANSPLANT  1991   KNEE ARTHROSCOPY WITH LATERAL MENISECTOMY Left 12/03/2017    Procedure: LEFT KNEE ARTHROSCOPY WITH LATERAL MENISECTOMY;  Surgeon: Earlie Server, MD;  Location: [place];  Service: Orthopedics;  Laterality: Left;   LAPAROSCOPIC GASTRIC BANDING  04/2004; 10/'09, 2/'10   Port Replacement x 2   LEFT HEART CATH AND CORONARY ANGIOGRAPHY N/A 02/20/2021   Procedure: LEFT HEART CATH AND CORONARY ANGIOGRAPHY;  Surgeon: [name], MD;  Location: [place] [facility];  Service: Cardiovascular;  Laterality: N/A;   LEFT HEART CATH AND CORS/GRAFTS ANGIOGRAPHY N/A 04/21/2018   Procedure: LEFT HEART CATH AND CORS/GRAFTS ANGIOGRAPHY;  Surgeon: [name], MD;  Location: [place] [facility];  Ost-Prox LAD 50% - proxLAD (pre & post D1) 100% CTO. Cx - patent, small OM1 (stable ~ ostial OM1 90%, too small for PCI) & 2 small LPL; Ost-distal RCA 100% CTO.  LIMA-LAD (not injected); SVG-dRCA patent, SVG-D1 - insertion stent ~20% ISR - Severe R CFA disease w/ focal Sub TO   LEFT HEART CATH AND CORS/GRAFTS ANGIOGRAPHY  5/'01, 3/'02, 8/'03, 10/'04; 1/'15   08/22/2013: LAD & RCA 100%; LIMA-LAD & SVG-rPDA patent; Cx-- OM1 60%, OM2 ostial ~50%; SVG-D1 - 80% mid, 50% distal ISR --PCI   LEFT HEART CATH AND CORS/GRAFTS ANGIOGRAPHY N/A 01/31/2021   Procedure: LEFT HEART CATH AND CORS/GRAFTS ANGIOGRAPHY;  Surgeon: Jolaine Artist, MD;  Location: [place] [facility];  Service: Cardiovascular;  Laterality: N/A;   LEFT HEART CATHETERIZATION WITH CORONARY/GRAFT ANGIOGRAM N/A 08/23/2013   Procedure: LEFT HEART CATHETERIZATION WITH Beatrix Fetters;  Surgeon: Wellington Hampshire, MD;  Location: [place] [facility];  Service: Cardiovascular;  Laterality: N/A;   Lower Extremity Arterial Dopplers  08/2013   ABI: R 0.96, L 1.04   MULTIPLE TOOTH EXTRACTIONS  age 65   NM MYOVIEW LTD  03/2016   EF 62%. LOW RISK. C/W prior MI - no Ischemia. Apical hypokinesis.   PERCUTANEOUS CORONARY STENT INTERVENTION (PCI-S)  5/'01, 3/'02, 8/'03, 10/'04;   '01 - S660 BMS 2.5 x 9 - dSVG-D1 into D1; '02- post-stent stenosis -  2.5 x 8 Pixel BMS; '8\03: ISR/Thrombosis into native D1 - AngioJet, 2.5 x 13 Pixel; '04 - ISR 95% - covered stented area with Taxus DES 2.5 mm x 20 (2.88)   PERCUTANEOUS CORONARY STENT INTERVENTION (PCI-S)  08/23/2013   Procedure: PERCUTANEOUS CORONARY STENT INTERVENTION (PCI-S);  Surgeon: Wellington Hampshire, MD;  Location: [facility] CATH LAB;  Service: Cardiovascular;;mid SVG-D1 80%; distal stent ~50% ISR; Promus [place] DES 2.75 mm xc 20 mm (2.8 mm)   PORT-A-CATH REMOVAL     kidney   TRANSTHORACIC ECHOCARDIOGRAM  07/2019   EF 55 to 60%.  No LVH.  Paradoxical septal wall motion-post CABG.  GRII DD.  Normal RV size and function.  Mild bilateral atrial dilation.  Moderate MAC.  Trace MR.  Mild aortic stenosis (gradients: Mean 14.3 mmHg -peak 24.9 mmHg).   TUBAL LIGATION     wrist fistula repair Left    dialysis for one year     Allergies  Allergen Reactions   Tetracycline Hives    Patient tolerated Doxycycline Dec 2020   Niacin Other (See Comments)    Mouth blisters   Niaspan [Niacin Er] Other (See Comments)    Mouth blisters   Sulfa Antibiotics Nausea Only and Other (See Comments)    "Tears up stomach"   Sulfonamide Derivatives Other (See Comments)    Reaction: per patient "tears her stomach up"   Codeine Nausea And Vomiting   Erythromycin Nausea And Vomiting   Hydromorphone Hcl Nausea And Vomiting   Morphine And Related Nausea And Vomiting   Nalbuphine Nausea And Vomiting    Nubain   Sulfasalazine Nausea Only and Other (See Comments)    per patient "tears her stomach up", "Tears up stomach"   Tape Rash and Other (See Comments)    No "plastic" tape," please----cloth tape only   Inpatient Medications      Family History    Family History  Problem Relation Age of Onset   Cancer Mother        liver   Heart disease Father    Cancer Father        colon   Arrhythmia Brother        Atrial Fibrillation   Arrhythmia Paternal Aunt        Atrial Fibrillation   She indicated  that her  mother is deceased. She indicated that her father is deceased. She indicated that her sister is alive. She indicated that only one of her three brothers is alive. She indicated that her maternal grandmother is deceased. She indicated that her maternal grandfather is deceased. She indicated that her paternal grandmother is deceased. She indicated that her paternal grandfather is deceased. She indicated that the status of her paternal aunt is unknown.   Social History    Social History   Socioeconomic History   Marital status: Widowed    Spouse name: Not on file   Number of children: Not on file   Years of education: Not on file   Highest education level: Not on file  Occupational History   Not on file  Tobacco Use   Smoking status: Every Day    Packs/day: 1.00    Years: 30.00    Pack years: 30.00    Types: Cigarettes    Last attempt to quit: 08/17/2002    Years since quitting: 18.7   Smokeless tobacco: Never  Vaping Use   Vaping Use: Never used  Substance and Sexual Activity   Alcohol use: No   Drug use: No   Sexual activity: Not on file  Other Topics Concern   Not on file  Social History Narrative   ** Merged History Encounter **       She is currently married, and the caregiver of her husband who is recovering from surgery for tongue cancer now diagnosed with lung cancer. Prior to his diagnosis of her husband, she actually had adopted a 76-year-old child who she knows caring for as wel   l. With all the surgeries, they have been quite financially troubled. Thanks the help of her community and church, they have been able to stay "alfoat."   She is a former smoker who quit in 2004 after a 30-pack-year history. She is active chasing a 5-y   ear-old child, does not do routine exercise. She's been quite depressed with the condition of her husband, and admits to eating comfort herself. She does not drink alcohol.   Social Determinants of Health   Financial Resource Strain: Not on  file  Food Insecurity: No Food Insecurity   Worried About Charity fundraiser in the Last Year: Never true   Ran Out of Food in the Last Year: Never true  Transportation Needs: No Transportation Needs   Lack of Transportation (Medical): No   Lack of Transportation (Non-Medical): No  Physical Activity: Not on file  Stress: Not on file  Social Connections: Not on file  Intimate Partner Violence: Not on file     Review of Systems    Unable to obtain due to patient condition  Physical Exam    Blood pressure (!) 121/58, pulse (!) 53, temperature (!) 97.5 F (36.4 C), temperature source Temporal, resp. rate 16, height _0  (1.549 m), weight 104.3 kg, SpO2 94 %.    No intake or output data in the 24 hours ending 05/12/21 0055 Wt Readings from Last 3 Encounters:  05/12/21 104.3 kg  04/22/21 97 kg  04/12/21 90.7 kg    CONSTITUTIONAL: obese, frail appearing woman, looks older than documented age, very drowsy, tangential but cannot complete a sentence HEENT: normal NECK: no JVD, no masses CARDIAC: Regular rhythm. 3/6 SEM. No friction rub. JVP not well visualized, but no JVD apparent. VASCULAR: Radial pulses intact bilaterally. No carotid bruits. PULMONARY/CHEST WALL: no deformities, normal breath sounds bilaterally, normal  work of breathing ABDOMINAL: soft, non-tender, non-distended EXTREMITIES: no edema, no muscle atrophy, warm and well-perfused SKIN: Dry and intact without apparent rashes or wounds. No peripheral cyanosis. NEUROLOGIC: drowsy, oriented x3, no abnormal movements, unable to answer questions appropriately   Labs     Lab Results  Component Value Date   WBC 3.9 (L) 05/12/2021   HGB 9.8 (L) 05/12/2021   HCT 31.7 (L) 05/12/2021   MCV 97.2 05/12/2021   PLT 232 05/12/2021    Recent Labs  Lab 05/12/21 0022 05/12/21 0233  NA 133* 135  K 4.4 3.8  CL 103  --   CO2 21*  --   BUN 14  --   CREATININE 1.29*  --   CALCIUM 8.7*  --   GLUCOSE 264*  --    Lab  Results  Component Value Date   CHOL 99 02/20/2021   HDL 51 02/20/2021   LDLCALC 15 02/20/2021   TRIG 165 (H) 02/20/2021   Lab Results  Component Value Date   DDIMER 0.46 08/13/2019   Recent Labs    07/11/20 0750 10/30/20 1949 01/29/21 0030  BNP 234.9* 300.3* 352.2*     Radiology Studies    DG Chest Portable 1 View Result Date: 05/12/2021 CLINICAL DATA:  Chest pain EXAM: PORTABLE CHEST 1 VIEW COMPARISON:  04/19/2021 FINDINGS: Lungs are clear. No pneumothorax or pleural effusion. Coronary artery bypass grafting has been performed. Cardiac size is mildly enlarged, unchanged. No acute bone abnormality. IMPRESSION: No active disease.  Stable cardiomegaly.  ECG & Cardiac Imaging   EMS strips: frequent pause-dependent PMVT salvos in setting of prolonged Qtc, consistent with TdP   ECG: Atrial fibrillation with SVR, voltage criteria for LVH, prolonged QTc- personally reviewed.  TTE 01/30/2021:  1. Left ventricular ejection fraction, by estimation, is 55 to 60%. The  left ventricle has normal function. Left ventricular endocardial border  not optimally defined to evaluate regional wall motion. There is mild left  ventricular hypertrophy. Left  ventricular diastolic parameters are consistent with Grade I diastolic  dysfunction (impaired relaxation). Elevated left atrial pressure.   2. Right ventricular systolic function is normal. The right ventricular  size is normal.   3. Left atrial size was moderately dilated.   4. The mitral valve is abnormal. Mild mitral valve regurgitation. Mild  mitral stenosis.   5. The aortic valve has an indeterminant number of cusps. There is mild  calcification of the aortic valve. There is mild thickening of the aortic  valve. Aortic valve regurgitation is not visualized. Mild aortic valve  stenosis.   Cath/PCI: 02/20/21   Mid RCA to Dist RCA lesion is 100% stenosed. Ost LAD to Prox LAD lesion is 50% stenosed. Prox LAD-1 lesion is 100% stenosed.  Prox LAD-2 lesion is 100% stenosed. Ost Cx to Prox Cx lesion is 60% stenosed -focal eccentric heavily calcified lesion -> negative RFR (0.96) 1st Mrg lesion is 40% stenosed. 2nd Mrg lesion is 80% stenosed - focal SVG-RCA graft was visualized by angiography and is large. The graft exhibits minimal luminal irregularities. SCG-RCA: Mid Graft to Dist Graft lesion is 40% stenosed. LIMA graft was not visualized due to inability to cannulate. ------CULPRIT LESIONS - SVG-DIAG ISR & Native Diag 95% ------- SVG-Diag: Prox Graft to Mid Graft Stent is 10% stenosed. SVG-Diag: Dist Graft to Insertion Stent is 90% stenosed. Scoring balloon angioplasty was performed using a BALLOON SCOREFLEX 2.50X10. Followed by 2.75 mm [place] balloon post dilation Post intervention, there is a 25% residual stenosis. Ost 1st  Diag Stent (from Graft into native) is 80% re-stenosed. Native 1st Diag (immediately after the sten) is 95% stenosed. A drug-eluting stent was successfully placed overlapping from the distal stent edge crossing the lesion, using a STENT ONYX FRONTIER 2.25X12. -> Postdilated at the overlap segment to 2.6 mm, and distally to 2.4 mm -------------------------------------------- Post intervention, there is a 15% residual stenosis at the 80% site, and 0% residual stenosis at the 95% site.. LV end diastolic pressure is moderate-severely elevated.   SUMMARY:  Stable Severe native CAD with chronic total occlusion of LAD and RCA LCx calcified 60% ostial lesion - RFR negative (0.96); PCI deferred. LIMA to LAD not visualized as only access was right radial SVG to RCA 40% - confirmed SVG to D2 recurrent high-grade 90% ISR with 95% lesion in D2 after graft insertion Successful DES PCI of 95% lesion in D2 after graft insertion and recent stent with Onyx Frontier 2.25 mm x 12 mm postdilated to 2.5 mm distally and 2.75 mm at the overlap. Borderline successful PTCA of in-stent restenosis in the body of graft. ->  2.5 mm  scoring balloon followed by post dilation with a 2.75 mm [place] balloon   Assessment & Plan    Prolonged QT with [place]es. EMS strips available are consistent with TdP, current Qtc is well over 500 in the setting of sinus bradycardia. No recurrence since arrival but remains high risk. Suspect due to AKI and DDIs with dofetilide, as well as possible medication misuse in the setting of encephalopathy.  - 4g mag sulfate with first 2g as IV push - start mag infusion at 0.5g/hr for 24 hrs. Check level every 6 hours, discontinue if Mg > 5. - start dopamine at 10 mcg/kg/min (non-titratable, to prevent pause-dependent initiation of arrhythmia), can stop in 6 hrs if no further TdP - Hold diltiazem and metoprolol for tonight, will reassess HR and QT in the AM before restarting - Hold dofetilide (QT prolongation), sertraline (QT prolongation), lamictal (category X DDI with Tikosyn), phenergan (mild QT prolongation), and clotrimazole (if still taking, DDI with Tikosyn). - Needs careful assessment of risk-benefit before restarting any of the above.  - Need to prevent hypoxemia to minimize ventricular ectopy (O2 increased to 6L as patient was hypoxemic on my assessment).   - Goal K 4.0  Persistent AF. Currently back in sinus rhythm with suspected dofetilide toxicity in setting of multiple DDIs, as above. Anticoagulated with Apixaban. - Will defer anticoagulation management to admitting team, but consider holding pending CT head - Hold dofetilide and metoprolol as above. Likely poor candidate to restart dofetilide. - If she goes into AF, do not attempt rate control without cardiology consultation  CAD s/p 3v CABG and PCIs. Seems to be stable. Troponin negative.  - Can continue home meds other than those noted above  HFpEF. Currently appears euvolemic. Last EF preserved. - Continue lasix at home dose starting tomorrow  Acute encephalopathy Acute on chronic respiratory failure with hypoxia and  likely hypercapnia History of renal transplant with AKI (stage 1) Polypharmacy with multiple high risk medications and multiple DDIs present Frailty with impaired mobility, possible inability to care for self - ED to consult CCM - Strongly consider basic ED work-up for encephalopathy including at least STAT blood gas and CT head, especially with apixaban use and high potential for supratherapeutic level.   Signed, Marykay Lex, MD 05/12/2021, 12:55 AM For questions or updates, please contact   Please consult [email or web address] for contact info under Cardiology/STEMI.

## 2021-05-12 NOTE — ED Notes (Signed)
ED TO INPATIENT HANDOFF REPORT  ED Nurse Name and Phone #: Mel Almond [phone]  S Name/Age/Gender Katherine [name] 65 y.o. female Room/Bed: RESUSC/RESUSC  Code Status   Code Status: Full Code  Home/SNF/Other Home Patient oriented to: self, place, time, and situation Is this baseline? Yes   Triage Complete: Triage complete  Chief Complaint [place] de pointes [facility]) [I47.2]  Triage Note Pt BIB [facility] from home. Initial call out due to syncopal episodes throughout the day. EMS reports runs of V tach and Ventricular Bigeminy. Pt c./o CP and SOB on arrival.    Allergies Allergies  Allergen Reactions   Tetracycline Hives    Patient tolerated Doxycycline Dec 2020   Niacin Other (See Comments)    Mouth blisters   Niaspan [Niacin Er] Other (See Comments)    Mouth blisters   Sulfa Antibiotics Nausea Only and Other (See Comments)    "Tears up stomach"   Sulfonamide Derivatives Other (See Comments)    Reaction: per patient "tears her stomach up"   Codeine Nausea And Vomiting   Erythromycin Nausea And Vomiting   Hydromorphone Hcl Nausea And Vomiting   Morphine And Related Nausea And Vomiting   Nalbuphine Nausea And Vomiting    Nubain   Sulfasalazine Nausea Only and Other (See Comments)    per patient "tears her stomach up", "Tears up stomach"   Tape Rash and Other (See Comments)    No "plastic" tape," please----cloth tape only    Level of Care/Admitting Diagnosis ED Disposition     ED Disposition  Admit   Condition  --   [place]: [place] [[id number]]  Level of Care: ICU [6]  May admit patient to [facility] or [facility] if equivalent level of care is available:: Yes  Covid Evaluation: Confirmed COVID Negative  Diagnosis: [place] [facility] [[id number]]  Admitting Physician: Renee Pain [[id number]]  Attending Physician: Renee Pain [[id number]]  Estimated length of stay: past midnight tomorrow  Certification:: I certify this  patient will need inpatient services for at least 2 midnights          B Medical/Surgery History Past Medical History:  Diagnosis Date   Anemia    Anxiety    Bilateral carotid artery stenosis    Carotid duplex 01/1606: 3-71% LICA, 06-26% RICA, >94% RECA, f/u 1 yr suggested   CAD (coronary artery disease) of bypass graft 5/01; 3/'02, 8/'03, 10/'04; 1/15   PCI x 5 to SVG-D1    CAD in native artery 07/1993   3 Vessel Disease (LAD-D1 & RCA) -- CABG (Dx in setting of inferior STEMI-PTCA of RCA)   CAD S/P percutaneous coronary angioplasty    PCI to SVG-D1 insertion/native D1 x 4 = '01 -(S660 BMS 2.5 x 9 anastomosis- D1); '02 - distal overlap ACS Pixel 2.5 x 8  BMS; '03 distal/native ISR/Thrombosis - Pixel 2.5 x 13; '04 - ISR-  Taxus 2.5 x 20 (covered all);; 1/15 - mid SVG-D1 (50% distal ISR) - Promus P 2.75 x 20 -- 2.8 mm   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 Lamonte Sakai "pulmonologist said no COPD"   Depression    Depression with anxiety    Diabetes mellitus type 2 in obese ([place])    Diarrhea    started after cholecystectomy and mass removed from intestine   Dyslipidemia, goal LDL below 70    08/2012: TC 137, TG 200, HDL 32!, LDL 45; on statin (followed by Dr.Deterding)   ESRD (end  stage renal disease) ([place]) 1991   s/p Cadaveric Renal Transplant [facility] - Dr. Jimmy Footman)    Family history of adverse reaction to anesthesia    mom's bp dropped during/after anesthesia   Fibromyalgia    GERD (gastroesophageal reflux disease)    Glomerulonephritis, chronic, rapidly progressive 1989   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 07/1993   Rescue PTCA of RCA -- referred for CABG.   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 Servando Snare: LIMA-LAD, SVG-bifurcatingD1, SVG-rPDA   History of kidney stones    History of stroke 2012   "right eye stroke- half blind now"   Hypertension associated with diabetes ([place])    Mild aortic  stenosis by prior echocardiogram 07/2019   Echo:  Mild aortic stenosis (gradients: Mean 14.3 mmHg -peak 24.9 mmHg).   Morbid obesity ([facility])    MRSA (methicillin resistant staph aureus) culture positive    OSA (obstructive sleep apnea)    no longer on CPAP or home O2, states she doesn't need now after lap band   PAD (peripheral artery disease) ([place]) 08/2013   LEA Dopplers to be read by Dr. Fletcher Anon   PAF (paroxysmal atrial fibrillation) ([place]) 06/2014   Noted on CardioNet Monitor  - --> rhythm control with Tikosyn (Dr. Rayann Heman); converted from warfarin to apixaban for anticoagulation.   Pneumonia    Recurrent boils    Bilateral Groin   Rheumatoid arthritis ([place])    Per Patient Report; associated with OA   S/p cadaver renal transplant 1991   [facility]   Unstable angina ([place]) 5/01; 3/'02, 8/'03, 10/'04; 1/15   x 5 occurences since Inf-Post STEMI in 1994   Past Surgical History:  Procedure Laterality Date   ABDOMINAL AORTOGRAM N/A 04/21/2018   Procedure: ABDOMINAL AORTOGRAM;  Surgeon: [name], MD;  Location: [place] [facility];  Service: Cardiovascular;  Laterality: N/A;   CATHETER REMOVAL     CHOLECYSTECTOMY N/A 10/29/2014   Procedure: LAPAROSCOPIC CHOLECYSTECTOMY WITH INTRAOPERATIVE CHOLANGIOGRAM;  Surgeon: Excell Seltzer, MD;  Location: [facility];  Service: General;  Laterality: N/A;   CORONARY ANGIOPLASTY  1994   x5   CORONARY ARTERY BYPASS GRAFT  1995   LIMA-LAD, SVG-RPDA, SVG-D1   CORONARY STENT INTERVENTION N/A 02/20/2021   Procedure: CORONARY STENT INTERVENTION;  Surgeon: [name], MD;  Location: [place] [facility];  Service: Cardiovascular;  Laterality: N/A;   ESOPHAGOGASTRODUODENOSCOPY N/A 10/15/2016   Procedure: ESOPHAGOGASTRODUODENOSCOPY (EGD);  Surgeon: Wilford Corner, MD;  Location: [facility] ENDOSCOPY;  Service: Endoscopy;  Laterality: N/A;   I & D EXTREMITY Right 01/29/2018   Procedure: IRRIGATION AND DEBRIDEMENT THUMB;  Surgeon: Dayna Barker, MD;  Location: [place];  Service:  Plastics;  Laterality: Right;   INCISE AND DRAIN ABCESS     INTRAVASCULAR PRESSURE WIRE/FFR STUDY N/A 02/20/2021   Procedure: INTRAVASCULAR PRESSURE WIRE/FFR STUDY;  Surgeon: [name], MD;  Location: [place] [facility];  Service: Cardiovascular;  Laterality: N/A;   KIDNEY TRANSPLANT  1991   KNEE ARTHROSCOPY WITH LATERAL MENISECTOMY Left 12/03/2017   Procedure: LEFT KNEE ARTHROSCOPY WITH LATERAL MENISECTOMY;  Surgeon: Earlie Server, MD;  Location: [place];  Service: Orthopedics;  Laterality: Left;   LAPAROSCOPIC GASTRIC BANDING  04/2004; 10/'09, 2/'10   Port Replacement x 2   LEFT HEART CATH AND CORONARY ANGIOGRAPHY N/A 02/20/2021   Procedure: LEFT HEART CATH AND CORONARY ANGIOGRAPHY;  Surgeon: Ellyn Hack,  Leonie Green, MD;  Location: [place] [facility];  Service: Cardiovascular;  Laterality: N/A;   LEFT HEART CATH AND CORS/GRAFTS ANGIOGRAPHY N/A 04/21/2018   Procedure: LEFT HEART CATH AND CORS/GRAFTS ANGIOGRAPHY;  Surgeon: [name], MD;  Location: [place] [facility];  Ost-Prox LAD 50% - proxLAD (pre & post D1) 100% CTO. Cx - patent, small OM1 (stable ~ ostial OM1 90%, too small for PCI) & 2 small LPL; Ost-distal RCA 100% CTO.  LIMA-LAD (not injected); SVG-dRCA patent, SVG-D1 - insertion stent ~20% ISR - Severe R CFA disease w/ focal Sub TO   LEFT HEART CATH AND CORS/GRAFTS ANGIOGRAPHY  5/'01, 3/'02, 8/'03, 10/'04; 1/'15   08/22/2013: LAD & RCA 100%; LIMA-LAD & SVG-rPDA patent; Cx-- OM1 60%, OM2 ostial ~50%; SVG-D1 - 80% mid, 50% distal ISR --PCI   LEFT HEART CATH AND CORS/GRAFTS ANGIOGRAPHY N/A 01/31/2021   Procedure: LEFT HEART CATH AND CORS/GRAFTS ANGIOGRAPHY;  Surgeon: Jolaine Artist, MD;  Location: [place] [facility];  Service: Cardiovascular;  Laterality: N/A;   LEFT HEART CATHETERIZATION WITH CORONARY/GRAFT ANGIOGRAM N/A 08/23/2013   Procedure: LEFT HEART CATHETERIZATION WITH Beatrix Fetters;  Surgeon: Wellington Hampshire, MD;  [facility];  Service: Cardiovascular;   Laterality: N/A;   Lower Extremity Arterial Dopplers  08/2013   ABI: R 0.96, L 1.04   MULTIPLE TOOTH EXTRACTIONS  age 62   NM MYOVIEW LTD  03/2016   EF 62%. LOW RISK. C/W prior MI - no Ischemia. Apical hypokinesis.   PERCUTANEOUS CORONARY STENT INTERVENTION (PCI-S)  5/'01, 3/'02, 8/'03, 10/'04;   '01 - S660 BMS 2.5 x 9 - dSVG-D1 into D1; '02- post-stent stenosis - 2.5 x 8 Pixel BMS; '8\03: ISR/Thrombosis into native D1 - AngioJet, 2.5 x 13 Pixel; '04 - ISR 95% - covered stented area with Taxus DES 2.5 mm x 20 (2.88)   PERCUTANEOUS CORONARY STENT INTERVENTION (PCI-S)  08/23/2013   Procedure: PERCUTANEOUS CORONARY STENT INTERVENTION (PCI-S);  Surgeon: Wellington Hampshire, MD;  Location: [facility] CATH LAB;  Service: Cardiovascular;;mid SVG-D1 80%; distal stent ~50% ISR; Promus Prermier DES 2.75 mm xc 20 mm (2.8 mm)   PORT-A-CATH REMOVAL     kidney   TRANSTHORACIC ECHOCARDIOGRAM  07/2019   EF 55 to 60%.  No LVH.  Paradoxical septal wall motion-post CABG.  GRII DD.  Normal RV size and function.  Mild bilateral atrial dilation.  Moderate MAC.  Trace MR.  Mild aortic stenosis (gradients: Mean 14.3 mmHg -peak 24.9 mmHg).   TUBAL LIGATION     wrist fistula repair Left    dialysis for one year     A IV Location/Drains/Wounds Patient Lines/Drains/Airways Status     Active Line/Drains/Airways     Name Placement date Placement time Site Days   Peripheral IV 05/12/21 20 G 1" Right Antecubital 05/12/21  0007  Antecubital  less than 1   External Urinary Catheter 03/30/21  1839  --  43            Intake/Output Last 24 hours No intake or output data in the 24 hours ending 05/12/21 0324  Labs/Imaging Results for orders placed or performed during the hospital encounter of 05/11/21 (from the past 48 hour(s))  Basic metabolic panel     Status: Abnormal   Collection Time: 05/12/21 12:22 AM  Result Value Ref Range   Sodium 133 (L) 135 - 145 mmol/L   Potassium 4.4 3.5 - 5.1 mmol/L   Chloride 103 98 - 111  mmol/L  CO2 21 (L) 22 - 32 mmol/L   Glucose, Bld 264 (H) 70 - 99 mg/dL    Comment: Glucose reference range applies only to samples taken after fasting for at least 8 hours.   BUN 14 8 - 23 mg/dL   Creatinine, Ser 1.29 (H) 0.44 - 1.00 mg/dL   Calcium 8.7 (L) 8.9 - 10.3 mg/dL   GFR, Estimated 47 (L) >60 mL/min    Comment: (NOTE) Calculated using the CKD-EPI Creatinine Equation (2021)    Anion gap 9 5 - 15    Comment: Performed at [facility] [address] [place], [place] [address]  CBC     Status: Abnormal   Collection Time: 05/12/21 12:22 AM  Result Value Ref Range   WBC 3.9 (L) 4.0 - 10.5 K/uL   RBC 3.26 (L) 3.87 - 5.11 MIL/uL   Hemoglobin 9.8 (L) 12.0 - 15.0 g/dL   HCT 31.7 (L) 36.0 - 46.0 %   MCV 97.2 80.0 - 100.0 fL   MCH 30.1 26.0 - 34.0 pg   MCHC 30.9 30.0 - 36.0 g/dL   RDW 18.7 (H) 11.5 - 15.5 %   Platelets 232 150 - 400 K/uL   nRBC 0.0 0.0 - 0.2 %    Comment: Performed at [facility] [address], [place], [place] [address]  Troponin I (High Sensitivity)     Status: None   Collection Time: 05/12/21 12:22 AM  Result Value Ref Range   Troponin I (High Sensitivity) 15 <18 ng/L    Comment: (NOTE) Elevated high sensitivity troponin I (hsTnI) values and significant  changes across serial measurements may suggest ACS but many other  chronic and acute conditions are known to elevate hsTnI results.  Refer to the "Links" section for chest pain algorithms and additional  guidance. Performed at [facility], [place] [address], [place], [place] [address]   Resp Panel by RT-PCR (Flu A&B, Covid) Nasopharyngeal Swab     Status: None   Collection Time: 05/12/21 12:22 AM   Specimen: Nasopharyngeal Swab; Nasopharyngeal(NP) swabs in vial transport medium  Result Value Ref Range   SARS Coronavirus 2 by RT PCR NEGATIVE NEGATIVE    Comment: (NOTE) SARS-CoV-2 target nucleic acids are NOT DETECTED.  The SARS-CoV-2 RNA is generally detectable in upper  respiratory specimens during the acute phase of infection. The lowest concentration of SARS-CoV-2 viral copies this assay can detect is 138 copies/mL. A negative result does not preclude SARS-Cov-2 infection and should not be used as the sole basis for treatment or other patient management decisions. A negative result may occur with  improper specimen collection/handling, submission of specimen other than nasopharyngeal swab, presence of viral mutation(s) within the areas targeted by this assay, and inadequate number of viral copies(<138 copies/mL). A negative result must be combined with clinical observations, patient history, and epidemiological information. The expected result is Negative.  Fact Sheet for Patients:  EntrepreneurPulse.com.au  Fact Sheet for Healthcare Providers:  IncredibleEmployment.be  This test is no t yet approved or cleared by the [place] FDA and  has been authorized for detection and/or diagnosis of SARS-CoV-2 by FDA under an Emergency Use Authorization (EUA). This EUA will remain  in effect (meaning this test can be used) for the duration of the COVID-19 declaration under Section 564(b)(1) of the Act, 21 U.S.C.section 360bbb-3(b)(1), unless the authorization is terminated  or revoked sooner.       Influenza A by PCR NEGATIVE NEGATIVE   Influenza  B by PCR NEGATIVE NEGATIVE    Comment: (NOTE) The Xpert Xpress SARS-CoV-2/FLU/RSV plus assay is intended as an aid in the diagnosis of influenza from Nasopharyngeal swab specimens and should not be used as a sole basis for treatment. Nasal washings and aspirates are unacceptable for Xpert Xpress SARS-CoV-2/FLU/RSV testing.  Fact Sheet for Patients: EntrepreneurPulse.com.au  Fact Sheet for Healthcare Providers: IncredibleEmployment.be  This test is not yet approved or cleared by the [place] FDA and has been authorized for  detection and/or diagnosis of SARS-CoV-2 by FDA under an Emergency Use Authorization (EUA). This EUA will remain in effect (meaning this test can be used) for the duration of the COVID-19 declaration under Section 564(b)(1) of the Act, 21 U.S.C. section 360bbb-3(b)(1), unless the authorization is terminated or revoked.  Performed at [facility], [place] [address] [place], [place] [address]   Magnesium     Status: None   Collection Time: 05/12/21 12:22 AM  Result Value Ref Range   Magnesium 1.8 1.7 - 2.4 mg/dL    Comment: Performed at [facility], [place] [address], [place], [place] [address]  I-Stat arterial blood gas, ED     Status: Abnormal   Collection Time: 05/12/21  2:33 AM  Result Value Ref Range   pH, Arterial 7.359 7.350 - 7.450   pCO2 arterial 42.2 32.0 - 48.0 mmHg   pO2, Arterial 64 (L) 83.0 - 108.0 mmHg   Bicarbonate 23.8 20.0 - 28.0 mmol/L   TCO2 25 22 - 32 mmol/L   O2 Saturation 91.0 %   Acid-base deficit 2.0 0.0 - 2.0 mmol/L   Sodium 135 135 - 145 mmol/L   Potassium 3.8 3.5 - 5.1 mmol/L   Calcium, Ion 1.24 1.15 - 1.40 mmol/L   HCT 30.0 (L) 36.0 - 46.0 %   Hemoglobin 10.2 (L) 12.0 - 15.0 g/dL   Collection site Radial    Drawn by RT    Sample type ARTERIAL    *Note: Due to a large number of results and/or encounters for the requested time period, some results have not been displayed. A complete set of results can be found in Results Review.   DG Chest Portable 1 View  Result Date: 05/12/2021 CLINICAL DATA:  Chest pain EXAM: PORTABLE CHEST 1 VIEW COMPARISON:  04/19/2021 FINDINGS: Lungs are clear. No pneumothorax or pleural effusion. Coronary artery bypass grafting has been performed. Cardiac size is mildly enlarged, unchanged. No acute bone abnormality. IMPRESSION: No active disease.  Stable cardiomegaly. Electronically Signed   By: Fidela Salisbury M.D.   On: 05/12/2021 00:40    Pending Labs Unresulted Labs (From admission, onward)     Start     Ordered    05/12/21 0500  CBC  Tomorrow morning,   R        05/12/21 0319   05/12/21 0500  Magnesium  Tomorrow morning,   R        05/12/21 0319   05/12/21 0500  Phosphorus  Tomorrow morning,   R        05/12/21 0319   05/12/21 0500  Comprehensive metabolic panel  Tomorrow morning,   STAT        05/12/21 0320   05/12/21 0321  Hemoglobin A1c  Once,   STAT       Comments: To assess prior glycemic control    05/12/21 0320   05/12/21 0305  Ammonia  Once,   STAT        05/12/21 0304  05/12/21 0304  Urine rapid drug screen (hosp performed)  ONCE - STAT,   STAT        05/12/21 0303   05/12/21 0304  Urinalysis, Complete w Microscopic  Once,   STAT        05/12/21 0303   05/12/21 0304  Ethanol  Once,   STAT        05/12/21 0303   05/12/21 0222  Blood gas, arterial  Once,   STAT        05/12/21 0221   05/12/21 0152  Calcium, ionized  Once,   STAT        05/12/21 0151            Vitals/Pain Today's Vitals   05/12/21 0200 05/12/21 0215 05/12/21 0245 05/12/21 0316  BP: (!) 101/59 102/69    Pulse: 66 75 81 78  Resp: 19 (!) _0 Temp:      TempSrc:      SpO2: 94% 94% 100% 99%  Weight:      Height:      PainSc:        Isolation Precautions Airborne and Contact precautions  Medications Medications  DOPamine (INTROPIN) 800 mg in dextrose 5 % 250 mL (3.2 mg/mL) infusion (10 mcg/kg/min  104.3 kg Intravenous Rate/Dose Change 05/12/21 0323)  magnesium sulfate IVPB 2 g 50 mL (2 g Intravenous New Bag/Given 05/12/21 0237)  ipratropium-albuterol (DUONEB) 0.5-2.5 (3) MG/3ML nebulizer solution 3 mL (has no administration in time range)  budesonide (PULMICORT) nebulizer solution 0.25 mg (has no administration in time range)  apixaban (ELIQUIS) tablet 5 mg (5 mg Oral Not Given 05/12/21 0318)  clopidogrel (PLAVIX) tablet 75 mg (has no administration in time range)  predniSONE (DELTASONE) tablet 5 mg (has no administration in time range)  azaTHIOprine (IMURAN) tablet 125 mg (has no  administration in time range)  docusate sodium (COLACE) capsule 100 mg (has no administration in time range)  polyethylene glycol (MIRALAX / GLYCOLAX) packet 17 g (has no administration in time range)  insulin aspart (novoLOG) injection 0-6 Units (has no administration in time range)  magnesium sulfate IVPB 2 g 50 mL (0 g Intravenous Stopped 05/12/21 0157)    Mobility walks with device High fall risk   Focused Assessments Cardiac Assessment Handoff:  Cardiac Rhythm: Sinus bradycardia Lab Results  Component Value Date   CKTOTAL 90 03/30/2021   CKMB 2.5 09/18/2010   TROPONINI <0.03 04/20/2018   Lab Results  Component Value Date   DDIMER 0.46 08/13/2019   Does the Patient currently have chest pain? Yes  Cardiac Rhythm: Sinus bradycardia   R Recommendations: See Admitting Provider Note  Report given to:   Additional Notes:  Infusing Dopamine and second bag of 2 g Mg. Pt normally 3 L O2, destated to 88% now on 4 L O2.

## 2021-05-12 NOTE — ED Notes (Signed)
Pt Placed on Zoll Monitor.

## 2021-05-12 NOTE — H&P (Addendum)
NAME:  Katherine [name], MRN:  [id number], DOB:  1956/06/30, LOS: 0 ADMISSION DATE:  05/11/2021, CONSULTATION DATE:  9/26 REFERRING MD:  Dr. Christy Gentles, CHIEF COMPLAINT:  prolonged qtc/bradycardia   History of Present Illness:  Patient is a 65 yo F w/ pertinent PMH of CAD with anterior MI (1994 s/p PTCA), CABG x3 (1995), PCI x5, persistent A. fib on Eliquis and Tikosyn, HFpEF, HTN, HLD, PAD, ESRD, renal transplant in 1991, COPD (patient of Dr. Lamonte Sakai, on 3 L [place] at home), depression, and T2DM presents to [facility] with syncope and palpitations.  Patient was staying with brother and she had palpitations and syncope. EMS called and found patient with Vtach and vent Bigeminy rhythm. Patient complained of CP and SOB on arrival. Upon arrival to [facility] on 9/25, EKG strip showed polymorhpic [name]/torsades. Cards consulted recommend 2g Mag IV push and start on dopamine drip for mild bradycardia. Recommended patient be admitted to ICU for further monitoring. Troponin 15. EKG in ED qtc 565 ms with rate 50 bpm. Patient Mag 1.8 currently being repleted. K 4.4. Glucose 264. Creat 1.29. Ionized calcium pending. WBC 3.9, Hgb 9.8. CXR clear. UDS and alcohol pending.  PCCM asked to consult for ICU admission and medical management.   Pertinent  Medical History   Past Medical History:  Diagnosis Date   Anemia    Anxiety    Bilateral carotid artery stenosis    Carotid duplex 10/2438: 1-02% LICA, 72-53% RICA, >66% RECA, f/u 1 yr suggested   CAD (coronary artery disease) of bypass graft 5/01; 3/'02, 8/'03, 10/'04; 1/15   PCI x 5 to SVG-D1    CAD in native artery 07/1993   3 Vessel Disease (LAD-D1 & RCA) -- CABG (Dx in setting of inferior STEMI-PTCA of RCA)   CAD S/P percutaneous coronary angioplasty    PCI to SVG-D1 insertion/native D1 x 4 = '01 -(S660 BMS 2.5 x 9 anastomosis- D1); '02 - distal overlap ACS Pixel 2.5 x 8  BMS; '03 distal/native ISR/Thrombosis - Pixel 2.5 x 13; '04 - ISR-  Taxus 2.5 x 20 (covered all);; 1/15  - mid SVG-D1 (50% distal ISR) - Promus P 2.75 x 20 -- 2.8 mm   COPD mixed type (HCC)    Followed by Dr. Lamonte Sakai "pulmonologist said no COPD"   Depression    Depression with anxiety    Diabetes mellitus type 2 in obese ([place])    Diarrhea    started after cholecystectomy and mass removed from intestine   Dyslipidemia, goal LDL below 70    08/2012: TC 137, TG 200, HDL 32!, LDL 45; on statin (followed by Dr.Deterding)   ESRD (end stage renal disease) ([place]) 1991   s/p Cadaveric Renal Transplant [facility] - Dr. Jimmy Footman)    Family history of adverse reaction to anesthesia    mom's bp dropped during/after anesthesia   Fibromyalgia    GERD (gastroesophageal reflux disease)    Glomerulonephritis, chronic, rapidly progressive 1989   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 07/1993   Rescue PTCA of RCA -- referred for CABG.   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 Servando Snare: LIMA-LAD, SVG-bifurcatingD1, SVG-rPDA   History of kidney stones    History of stroke 2012   "right eye stroke- half blind now"   Hypertension associated with diabetes ([place])    Mild aortic stenosis by prior echocardiogram 07/2019   Echo:  Mild aortic stenosis (gradients: Mean 14.3 mmHg -peak 24.9 mmHg).   Morbid obesity (HCC)    MRSA (methicillin resistant staph aureus) culture positive    OSA (obstructive sleep apnea)    no longer on CPAP or home O2, states she doesn't need now after lap band   PAD (peripheral artery disease) ([place]) 08/2013   LEA Dopplers to be read by Dr. Fletcher Anon   PAF (paroxysmal atrial fibrillation) ([place]) 06/2014   Noted on CardioNet Monitor  - --> rhythm control with Tikosyn (Dr. Rayann Heman); converted from warfarin to apixaban for anticoagulation.   Pneumonia    Recurrent boils    Bilateral Groin   Rheumatoid arthritis ([place])    Per Patient Report; associated with OA   S/p cadaver renal transplant 1991   [place]   Unstable angina  ([place]) 5/01; 3/'02, 8/'03, 10/'04; 1/15   x 5 occurences since Inf-Post STEMI in [facility] Events: Including procedures, antibiotic start and stop dates in addition to other pertinent events   9/26: admitted to [facility] with prolonged qtc w/ torsades and bradycardia on dopamine.  Interim History / Subjective:  Patient is on dopamine drip. HR in 70s MAP 75 Patient is sleepy but able to arouse and answer most questions. Has some trouble with her words. MAE.  On 5 l/m [place]  Objective   Blood pressure 101/89, pulse 61, temperature (!) 97.5 F (36.4 C), temperature source Temporal, resp. rate 18, height _0  (1.549 m), weight 104.3 kg, SpO2 90 %.       No intake or output data in the 24 hours ending 05/12/21 0144 Filed Weights   05/12/21 0004  Weight: 104.3 kg    Examination: General:  lethargic appearing on nasal cannula HEENT: MM pink/moist; [place] in place Neuro: Patient is lethargic but arousable to answer questions. A/Ox3 CV: s1s2, no m/r/g; EKG shows bradycardia w/ qtc 565 ms and LVH; Rate currently 70s on dopamine, MAP 75 PULM:  dim clear BS bilaterally; [place] 5 L GI: soft, bsx4 active  Extremities: warm/dry, no edema  Skin: no rashes or lesions   Labs: Na 133, K 4.4, Mag 1.8 CO2 21 Glucose 260 Creat 1.29 Trop 15 Hgb 9.8  Resolved Hospital Problem list     Assessment & Plan:  Prolonged QT w/ [place]: takes diltizem, lamictal, sertraline, lopressor, phenergan at home Bradycardia P: -admit to ICU for telemetry monitoring -cardiology consulted -On dopamine per cards: wean dopamine for MAP >65 -repleting Mag -trend BMP/Mag -check ionized calcium -avoid qtc prolonging medications  Acute encephalopathy P: -avoid sedating medications -frequent neuro checks -check ABG to rule out hypercarbia -check ammonia -check UDS, alcohol level  Hx of persistent A. Fib on eliquis and tikosyn Hx of HFpEF, CAD, CABG, PCI x5, PAD P: -cards  following -continue eliquis -hold tikosyn due to qtc prolonged -continue plavix  AKI on ESRD History of renal transplant with AKI (stage 1) P: -trend UOP/BMP -renal dose meds; avoid nephrotoxic agents -continue renal transplant meds: steroids and azioprine  Hx of COPD: patient of Dr. Lamonte Sakai; on 3 L [place] at home Hx of OSA P: -duoneb and pulmicort -wean O2 for sats >90% to baseline O2 requirements -cpap qhs  T2DM P: -ssi and cbg -a1c  Hx of HTN, HLD P: -hold home meds  Hx of depression and anxiety P: -hold home meds -consider restarting lamictal   Best Practice (right click and "Reselect all SmartList Selections" daily)   Diet/type: NPO w/ oral meds DVT  prophylaxis: DOAC GI prophylaxis: H2B Lines: N/A Foley:  N/A Code Status:  full code Last date of multidisciplinary goals of care discussion [pending]  Labs   CBC: Recent Labs  Lab 05/12/21 0022  WBC 3.9*  HGB 9.8*  HCT 31.7*  MCV 97.2  PLT 945    Basic Metabolic Panel: Recent Labs  Lab 05/12/21 0022  NA 133*  K 4.4  CL 103  CO2 21*  GLUCOSE 264*  BUN 14  CREATININE 1.29*  CALCIUM 8.7*  MG 1.8   GFR: Estimated Creatinine Clearance: 49.6 mL/min (A) (by C-G formula based on SCr of 1.29 mg/dL (H)). Recent Labs  Lab 05/12/21 0022  WBC 3.9*    Liver Function Tests: No results for input(s): AST, ALT, ALKPHOS, BILITOT, PROT, ALBUMIN in the last 168 hours. No results for input(s): LIPASE, AMYLASE in the last 168 hours. No results for input(s): AMMONIA in the last 168 hours.  ABG    Component Value Date/Time   PHART 7.450 08/09/2019 0234   PCO2ART 39.0 08/09/2019 0234   PO2ART 157 (H) 08/09/2019 0234   HCO3 28.1 (H) 01/29/2021 0152   TCO2 29 01/29/2021 0152   ACIDBASEDEF 1.0 06/04/2008 1641   O2SAT 98.0 01/29/2021 0152     Coagulation Profile: No results for input(s): INR, PROTIME in the last 168 hours.  Cardiac Enzymes: No results for input(s): CKTOTAL, CKMB, CKMBINDEX, TROPONINI in  the last 168 hours.  HbA1C: Hgb A1c MFr Bld  Date/Time Value Ref Range Status  01/31/2021 01:43 AM 9.1 (H) 4.8 - 5.6 % Final    Comment:    (NOTE)         Prediabetes: 5.7 - 6.4         Diabetes: >6.4         Glycemic control for adults with diabetes: <7.0   07/11/2020 11:04 AM 7.7 (H) 4.8 - 5.6 % Final    Comment:    (NOTE) Pre diabetes:          5.7%-6.4%  Diabetes:              >6.4%  Glycemic control for   <7.0% adults with diabetes     CBG: No results for input(s): GLUCAP in the last 168 hours.  Review of Systems:   Unable to obtain. Patient altered. Obtained info from chart and nurse.  Past Medical History:  She,  has a past medical history of Anemia, Anxiety, Bilateral carotid artery stenosis, CAD (coronary artery disease) of bypass graft (5/01; 3/'02, 8/'03, 10/'04; 1/15), CAD in native artery (07/1993), CAD S/P percutaneous coronary angioplasty, COPD mixed type ([place]), Depression, Depression with anxiety, Diabetes mellitus type 2 in obese ([place]), Diarrhea, Dyslipidemia, goal LDL below 70, ESRD (end stage renal disease) ([place]) (1991), Family history of adverse reaction to anesthesia, Fibromyalgia, GERD (gastroesophageal reflux disease), Glomerulonephritis, chronic, rapidly progressive (1989), 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 (07/1993), H/O: GI bleed, Headache, History of CABG x 3 (08/1993), History of kidney stones, History of stroke (2012), Hypertension associated with diabetes ([place]), Mild aortic stenosis by prior echocardiogram (07/2019), Morbid obesity ([place]), MRSA (methicillin resistant staph aureus) culture positive, OSA (obstructive sleep apnea), PAD (peripheral artery disease) ([place]) (08/2013), PAF (paroxysmal atrial fibrillation) ([place]) (06/2014), Pneumonia, Recurrent boils, Rheumatoid arthritis ([place]), S/p cadaver renal transplant (1991), and Unstable angina ([place]) (5/01; 3/'02, 8/'03, 10/'04; 1/15).   Surgical History:   Past Surgical  History:  Procedure Laterality Date   ABDOMINAL AORTOGRAM N/A 04/21/2018   Procedure: ABDOMINAL AORTOGRAM;  Surgeon: [name], MD;  Location: [facility];  Service: Cardiovascular;  Laterality: N/A;   CATHETER REMOVAL     CHOLECYSTECTOMY N/A 10/29/2014   Procedure: LAPAROSCOPIC CHOLECYSTECTOMY WITH INTRAOPERATIVE CHOLANGIOGRAM;  Surgeon: Excell Seltzer, MD;  Location: [facility];  Service: General;  Laterality: N/A;   CORONARY ANGIOPLASTY  1994   x5   CORONARY ARTERY BYPASS GRAFT  1995   LIMA-LAD, SVG-RPDA, SVG-D1   CORONARY STENT INTERVENTION N/A 02/20/2021   Procedure: CORONARY STENT INTERVENTION;  Surgeon: [name], MD;  Location: [facility];  Service: Cardiovascular;  Laterality: N/A;   ESOPHAGOGASTRODUODENOSCOPY N/A 10/15/2016   Procedure: ESOPHAGOGASTRODUODENOSCOPY (EGD);  Surgeon: Wilford Corner, MD;  Location: [facility];  Service: Endoscopy;  Laterality: N/A;   I & D EXTREMITY Right 01/29/2018   Procedure: IRRIGATION AND DEBRIDEMENT THUMB;  Surgeon: Dayna Barker, MD;  Location: [facility];  Service: Plastics;  Laterality: Right;   INCISE AND DRAIN ABCESS     INTRAVASCULAR PRESSURE WIRE/FFR STUDY N/A 02/20/2021   Procedure: INTRAVASCULAR PRESSURE WIRE/FFR STUDY;  Surgeon: [name], MD;  Location: [facility];  Service: Cardiovascular;  Laterality: N/A;   KIDNEY TRANSPLANT  1991   KNEE ARTHROSCOPY WITH LATERAL MENISECTOMY Left 12/03/2017   Procedure: LEFT KNEE ARTHROSCOPY WITH LATERAL MENISECTOMY;  Surgeon: Earlie Server, MD;  Location: [facility];  Service: Orthopedics;  Laterality: Left;   LAPAROSCOPIC GASTRIC BANDING  04/2004; 10/'09, 2/'10   Port Replacement x 2   LEFT HEART CATH AND CORONARY ANGIOGRAPHY N/A 02/20/2021   Procedure: LEFT HEART CATH AND CORONARY ANGIOGRAPHY;  Surgeon: [name], MD;  Location: [facility];  Service: Cardiovascular;  Laterality: N/A;   LEFT HEART CATH AND CORS/GRAFTS ANGIOGRAPHY N/A 04/21/2018   Procedure:  LEFT HEART CATH AND CORS/GRAFTS ANGIOGRAPHY;  Surgeon: [name], MD;  Location: [facility];  Ost-Prox LAD 50% - proxLAD (pre & post D1) 100% CTO. Cx - patent, small OM1 (stable ~ ostial OM1 90%, too small for PCI) & 2 small LPL; Ost-distal RCA 100% CTO.  LIMA-LAD (not injected); SVG-dRCA patent, SVG-D1 - insertion stent ~20% ISR - Severe R CFA disease w/ focal Sub TO   LEFT HEART CATH AND CORS/GRAFTS ANGIOGRAPHY  5/'01, 3/'02, 8/'03, 10/'04; 1/'15   08/22/2013: LAD & RCA 100%; LIMA-LAD & SVG-rPDA patent; Cx-- OM1 60%, OM2 ostial ~50%; SVG-D1 - 80% mid, 50% distal ISR --PCI   LEFT HEART CATH AND CORS/GRAFTS ANGIOGRAPHY N/A 01/31/2021   Procedure: LEFT HEART CATH AND CORS/GRAFTS ANGIOGRAPHY;  Surgeon: Jolaine Artist, MD;  Location: [facility];  Service: Cardiovascular;  Laterality: N/A;   LEFT HEART CATHETERIZATION WITH CORONARY/GRAFT ANGIOGRAM N/A 08/23/2013   Procedure: LEFT HEART CATHETERIZATION WITH Beatrix Fetters;  Surgeon: Wellington Hampshire, MD;  Location: [facility];  Service: Cardiovascular;  Laterality: N/A;   Lower Extremity Arterial Dopplers  08/2013   ABI: R 0.96, L 1.04   MULTIPLE TOOTH EXTRACTIONS  age 71   NM MYOVIEW LTD  03/2016   EF 62%. LOW RISK. C/W prior MI - no Ischemia. Apical hypokinesis.   PERCUTANEOUS CORONARY STENT INTERVENTION (PCI-S)  5/'01, 3/'02, 8/'03, 10/'04;   '01 - S660 BMS 2.5 x 9 - dSVG-D1 into D1; '02- post-stent stenosis - 2.5 x 8 Pixel BMS; '8\03: ISR/Thrombosis into native D1 - AngioJet, 2.5 x 13 Pixel; '04 - ISR 95% - covered stented area with Taxus DES 2.5 mm x 20 (2.88)   PERCUTANEOUS CORONARY STENT  INTERVENTION (PCI-S)  08/23/2013   Procedure: PERCUTANEOUS CORONARY STENT INTERVENTION (PCI-S);  Surgeon: Wellington Hampshire, MD;  Location: [facility] C[facility];  Service: Cardiovascular;;mid SVG-D1 80%; distal stent ~50% ISR; Promus [place] DES 2.75 mm xc 20 mm (2.8 mm)   PORT-A-CATH REMOVAL     kidney   TRANSTHORACIC ECHOCARDIOGRAM   07/2019   EF 55 to 60%.  No LVH.  Paradoxical septal wall motion-post CABG.  GRII DD.  Normal RV size and function.  Mild bilateral atrial dilation.  Moderate MAC.  Trace MR.  Mild aortic stenosis (gradients: Mean 14.3 mmHg -peak 24.9 mmHg).   TUBAL LIGATION     wrist fistula repair Left    dialysis for one year     Social History:   reports that she has been smoking cigarettes. She has a 30.00 pack-year smoking history. She has never used smokeless tobacco. She reports that she does not drink alcohol and does not use drugs.   Family History:  Her family history includes Arrhythmia in her brother and paternal aunt; Cancer in her father and mother; Heart disease in her father.   Allergies Allergies  Allergen Reactions   Tetracycline Hives    Patient tolerated Doxycycline Dec 2020   Niacin Other (See Comments)    Mouth blisters   Niaspan [Niacin Er] Other (See Comments)    Mouth blisters   Sulfa Antibiotics Nausea Only and Other (See Comments)    "Tears up stomach"   Sulfonamide Derivatives Other (See Comments)    Reaction: per patient "tears her stomach up"   Codeine Nausea And Vomiting   Erythromycin Nausea And Vomiting   Hydromorphone Hcl Nausea And Vomiting   Morphine And Related Nausea And Vomiting   Nalbuphine Nausea And Vomiting    Nubain   Sulfasalazine Nausea Only and Other (See Comments)    per patient "tears her stomach up", "Tears up stomach"   Tape Rash and Other (See Comments)    No "plastic" tape," please----cloth tape only     Home Medications  Prior to Admission medications   Medication Sig Start Date End Date Taking? Authorizing Provider  acetaminophen (TYLENOL) 500 MG tablet Take 1,500 mg by mouth 2 (two) times daily.    [provider]  albuterol (VENTOLIN HFA) 108 (90 Base) MCG/ACT inhaler TAKE 2 PUFFS BY MOUTH EVERY 6 HOURS AS NEEDED FOR WHEEZE OR SHORTNESS OF BREATH Patient taking differently: Inhale 2 puffs into the lungs every 6 (six) hours  as needed for wheezing or shortness of breath. 02/10/21   Byrum, Rose Fillers, MD  apixaban (ELIQUIS) 5 MG TABS tablet Take 1 tablet (5 mg total) by mouth 2 (two) times daily. APPOINTMENT NEEDED WITH CARDIOLOGIST FOR FURTHER REFILLS 04/17/21   Sherran Needs, NP  arformoterol (BROVANA) 15 MCG/2ML NEBU Take 2 mLs (15 mcg total) by nebulization 2 (two) times daily. 08/30/19   Collene Gobble, MD  azaTHIOprine (IMURAN) 50 MG tablet Take 125 mg by mouth See admin instructions. Take 2 1/2 tablets (125 mg) by mouth daily at 3pm    [provider]  budesonide (PULMICORT) 0.5 MG/2ML nebulizer solution Take 2 mLs (0.5 mg total) by nebulization 2 (two) times daily. 08/24/19   [name], MD  calcitRIOL (ROCALTROL) 0.25 MCG capsule Take 0.25 mcg by mouth every 3 (three) days.     [provider]  cetirizine (ZYRTEC) 5 MG tablet Take 5 mg by mouth daily.    [provider]  clopidogrel (PLAVIX) 75 MG tablet  Take 1 tablet (75 mg total) by mouth daily. 02/22/21   Cheryln Manly, NP  clotrimazole (MYCELEX) 10 MG troche Take 1 tablet (10 mg total) by mouth 5 (five) times daily as needed (thrush). Patient not taking: Reported on 05/07/2021 02/03/21   Edwin Dada, MD  cyclobenzaprine (FLEXERIL) 10 MG tablet TAKE 2 TAB DAILY AT BEDTIME, MAY ALSO TAKE 1 TABLET BY MOUTH AT NOON AS NEEDED FOR MUSCLE SPASMS Patient taking differently: Take 5 mg by mouth 2 (two) times daily as needed. 04/18/21   Meredith Staggers, MD  diclofenac Sodium (VOLTAREN) 1 % GEL Apply 2 g topically 4 (four) times daily. Patient not taking: No sig reported 03/22/21   Debbe Odea, MD  diltiazem (CARDIZEM) 30 MG tablet take 1 tablet every 4 hours AS NEEDED for afib heart rate >100 as long as top blood pressure >100. Patient not taking: Reported on 05/07/2021 03/04/21   Sherran Needs, NP  dofetilide (TIKOSYN) 250 MCG capsule Take 1 capsule (250 mcg total) by mouth 2 (two) times daily. 03/04/21   Sherran Needs, NP   DULoxetine (CYMBALTA) 30 MG capsule TAKE 2 CAPSULES (60 MG TOTAL) BY MOUTH EVERY MORNING. Patient taking differently: Take 60 mg by mouth every morning. 10/30/20   Bayard Hugger, NP  fluticasone (FLONASE) 50 MCG/ACT nasal spray PLACE 2 SPRAYS INTO BOTH NOSTRILS 2 TIMES DAILY Patient taking differently: Place 2 sprays into both nostrils 2 (two) times daily. 12/17/20   Collene Gobble, MD  furosemide (LASIX) 40 MG tablet Take 1 tablet (40 mg total) by mouth daily. Patient not taking: No sig reported 07/14/20   Dwyane Dee, MD  glimepiride (AMARYL) 4 MG tablet Take 4 mg by mouth daily. 02/03/20   [provider]  insulin aspart (NOVOLOG FLEXPEN) 100 UNIT/ML FlexPen Inject 14 Units into the skin 3 (three) times daily with meals.    [provider]  Insulin Glargine (BASAGLAR KWIKPEN) 100 UNIT/ML SOPN Inject 12 Units into the skin daily before breakfast.    [provider]  isosorbide mononitrate (IMDUR) 60 MG 24 hr tablet Take 1 tablet (60 mg total) by mouth daily. 03/22/21   Debbe Odea, MD  lamoTRIgine (LAMICTAL) 25 MG tablet TAKE 1 TABLET BY MOUTH EVERYDAY AT BEDTIME Patient taking differently: Take 25 mg by mouth at bedtime. 03/05/21   Meredith Staggers, MD  LORazepam (ATIVAN) 1 MG tablet Take 1 tablet (1 mg total) by mouth 2 (two) times daily. 03/20/21   [name], MD  magnesium oxide (MAG-OX) 400 (240 Mg) MG tablet Take 1 tablet (400 mg total) by mouth daily. Patient not taking: Reported on 05/07/2021 04/23/21   Flora Lipps, MD  metFORMIN (GLUCOPHAGE) 500 MG tablet Take 1 tablet (500 mg total) by mouth 2 (two) times daily. 02/22/21   Burnell Blanks, MD  metoprolol tartrate (LOPRESSOR) 100 MG tablet Take 1 tablet (100 mg total) by mouth 2 (two) times daily. Appointment Required For Further Refills [phone] 03/22/21   Debbe Odea, MD  montelukast (SINGULAIR) 10 MG tablet TAKE 1 TABLET BY MOUTH EVERYDAY AT BEDTIME Patient taking differently: Take 10 mg  by mouth at bedtime. 02/10/21   Collene Gobble, MD  nitroGLYCERIN (NITROSTAT) 0.4 MG SL tablet Place 1 tablet (0.4 mg total) under the tongue every 5 (five) minutes as needed for chest pain. 02/21/21   Burnell Blanks, MD  Oxycodone HCl 10 MG TABS Take 1 tablet (10 mg total) by mouth 4 (four) times daily  as needed. Patient taking differently: Take 10 mg by mouth 4 (four) times daily as needed. Taking 49m twice daily 04/28/21   TBayard Hugger NP  OXYGEN Inhale 3 L into the lungs continuous.    [provider]  pantoprazole (PROTONIX) 40 MG tablet Take 1 tablet (40 mg total) by mouth daily. 02/21/21 05/22/21  RCheryln Manly NP  phenazopyridine (PYRIDIUM) 100 MG tablet Take 1 tablet (100 mg total) by mouth 3 (three) times daily with meals. Patient not taking: Reported on 05/07/2021 04/22/21   PFlora Lipps MD  predniSONE (DELTASONE) 5 MG tablet Take 5 mg by mouth every morning. 06/19/19   [provider]  promethazine (PHENERGAN) 25 MG tablet Take 1 tablet (25 mg total) by mouth at bedtime as needed for nausea. For nausea and sleep 07/14/20   GDwyane Dee MD  rosuvastatin (CRESTOR) 40 MG tablet Take 1 tablet (40 mg total) by mouth daily. Patient taking differently: Take 20 mg by mouth daily. 02/03/21   Danford, CSuann Larry MD  sertraline (ZOLOFT) 100 MG tablet Take 100 mg by mouth every morning. 06/12/20   [provider]     Critical care time: 4Bowman PA-C [facility] 05/12/2021, 3:24 AM  Please see [email or web address] for pager details.  From 7A-7P if no response, please call [phone]. After hours, please call ELink [phone]

## 2021-05-12 NOTE — Progress Notes (Addendum)
Progress Note  Patient Name: Katherine [name] Date of Encounter: 05/12/2021  Primary Cardiologist:   [name], MD   Subjective   Complains of chest pain.  No SOB.    Inpatient Medications    Scheduled Meds:  apixaban  5 mg Oral BID   arformoterol  15 mcg Nebulization BID   azaTHIOprine  125 mg Oral See admin instructions   budesonide  2 mL Nebulization BID   calcitRIOL  0.25 mcg Oral Q3 days   Chlorhexidine Gluconate Cloth  6 each Topical Daily   clopidogrel  75 mg Oral Daily   famotidine  20 mg Oral Daily   fluticasone  2 spray Each Nare BID   [START ON 05/13/2021] influenza vac split quadrivalent PF  0.5 mL Intramuscular Tomorrow-1000   insulin aspart  0-6 Units Subcutaneous Q4H   ipratropium-albuterol  3 mL Nebulization Q6H   loratadine  10 mg Oral Daily   magnesium oxide  400 mg Oral Daily   montelukast  10 mg Oral QHS   predniSONE  5 mg Oral q morning   rosuvastatin  20 mg Oral Daily   Continuous Infusions:  DOPamine 10 mcg/kg/min (05/12/21 0600)   PRN Meds: docusate sodium, nitroGLYCERIN, Oxycodone HCl, polyethylene glycol   Vital Signs    Vitals:   05/12/21 0545 05/12/21 0600 05/12/21 0615 05/12/21 0700  BP:  (!) 115/48    Pulse: 81 79 78 77  Resp: 17 19 (!) 21 18  Temp:      TempSrc:      SpO2: 92% 96% 94% 93%  Weight:      Height:        Intake/Output Summary (Last 24 hours) at 05/12/2021 0818 Last data filed at 05/12/2021 0600 Gross per 24 hour  Intake 330.12 ml  Output 0 ml  Net 330.12 ml   Filed Weights   05/12/21 0004 05/12/21 0417  Weight: 104.3 kg 96 kg    Telemetry    Sinus tach.  Wide complex possibly aberrancy - Personally Reviewed  ECG    NSR, bigeminy, LAD, no acute ST T wave changes.  - Personally Reviewed  Physical Exam   GEN: No  acute distress.   Neck: No  JVD Cardiac: RRR, no murmurs, rubs, or gallops.  Respiratory: Clear   to auscultation bilaterally. GI: Soft, nontender, non-distended, normal bowel sounds   MS:  No edema; No deformity. Neuro:   Nonfocal  Psych: Oriented and appropriate    Labs    Chemistry Recent Labs  Lab 05/12/21 0022 05/12/21 0233  NA 133* 135  K 4.4 3.8  CL 103  --   CO2 21*  --   GLUCOSE 264*  --   BUN 14  --   CREATININE 1.29*  --   CALCIUM 8.7*  --   GFRNONAA 47*  --   ANIONGAP 9  --      Hematology Recent Labs  Lab 05/12/21 0022 05/12/21 0233  WBC 3.9*  --   RBC 3.26*  --   HGB 9.8* 10.2*  HCT 31.7* 30.0*  MCV 97.2  --   MCH 30.1  --   MCHC 30.9  --   RDW 18.7*  --   PLT 232  --     Cardiac EnzymesNo results for input(s): TROPONINI in the last 168 hours. No results for input(s): TROPIPOC in the last 168 hours.   BNPNo results for input(s): BNP, PROBNP in the last 168 hours.   DDimer No results for  input(s): DDIMER in the last 168 hours.   Radiology    DG Chest Portable 1 View  Result Date: 05/12/2021 CLINICAL DATA:  Chest pain EXAM: PORTABLE CHEST 1 VIEW COMPARISON:  04/19/2021 FINDINGS: Lungs are clear. No pneumothorax or pleural effusion. Coronary artery bypass grafting has been performed. Cardiac size is mildly enlarged, unchanged. No acute bone abnormality. IMPRESSION: No active disease.  Stable cardiomegaly. Electronically Signed   By: Fidela Salisbury M.D.   On: 05/12/2021 00:40    Cardiac Studies   Cath/PCI: 02/20/21   Mid RCA to Dist RCA lesion is 100% stenosed. Ost LAD to Prox LAD lesion is 50% stenosed. Prox LAD-1 lesion is 100% stenosed. Prox LAD-2 lesion is 100% stenosed. Ost Cx to Prox Cx lesion is 60% stenosed -focal eccentric heavily calcified lesion -> negative RFR (0.96) 1st Mrg lesion is 40% stenosed. 2nd Mrg lesion is 80% stenosed - focal SVG-RCA graft was visualized by angiography and is large. The graft exhibits minimal luminal irregularities. SCG-RCA: Mid Graft to Dist Graft lesion is 40% stenosed. LIMA graft was not visualized due to inability to cannulate. ------CULPRIT LESIONS - SVG-DIAG ISR & Native Diag 95%  ------- SVG-Diag: Prox Graft to Mid Graft Stent is 10% stenosed. SVG-Diag: Dist Graft to Insertion Stent is 90% stenosed. Scoring balloon angioplasty was performed using a BALLOON SCOREFLEX 2.50X10. Followed by 2.75 mm [place] balloon post dilation Post intervention, there is a 25% residual stenosis. Ost 1st Diag Stent (from Graft into native) is 80% re-stenosed. Native 1st Diag (immediately after the sten) is 95% stenosed. A drug-eluting stent was successfully placed overlapping from the distal stent edge crossing the lesion, using a STENT ONYX FRONTIER 2.25X12. -> Postdilated at the overlap segment to 2.6 mm, and distally to 2.4 mm -------------------------------------------- Post intervention, there is a 15% residual stenosis at the 80% site, and 0% residual stenosis at the 95% site.. LV end diastolic pressure is moderate-severely elevated.  Patient Profile     65 y.o. 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 She apparently called EMS due to recurrent syncope with WCT captured on pre-hospital rhythm strip. Cardiology consulted for further evaluation.   Assessment & Plan    WCT:  Prolonged QT with TdP.   Likely med related.   QTc is OK.     Cymbalta might be the most appropriate antidepressant from a QTc standpoint if thought to be appropriate per her PCP or psychiatry and with close follow up.  However, I would defer initiation of this.  Wide complex today with some irregularity.  Possibly afib with aberrancy.    I agree with starting with beta blocker.  I would not resume Tikosyn at this point.    ATRIAL FIB:    Continues Eliquis.     CHEST PAIN:    Medical management of disease as above.  Enzymes negative this  admission.  I have reviewed the cath films and given the flat trop lower than previous I would not rush to suggest her third cath in a few months.  Restarted beta blocker.   AKI:  History of renal transplant.  Creat 1.13.     Anemia:  Follow Hgb which has dropped.  Likely chronic disease  and blood draw related.     For questions or updates, please contact [facility] Please consult [email or web address] for contact info under Cardiology/STEMI.   Signed, Minus Breeding, MD  05/12/2021, 8:18 AM

## 2021-05-12 NOTE — ED Notes (Signed)
XR at bedside

## 2021-05-12 NOTE — Progress Notes (Addendum)
NAME:  Katherine [name], MRN:  [id number], DOB:  November 15, 1955, LOS: 0 ADMISSION DATE:  05/11/2021, CONSULTATION DATE:  9/26 REFERRING MD:  Dr. Christy Gentles, CHIEF COMPLAINT:  Prolonged qtc/bradycardia   History of Present Illness:  65 yo F w/ pertinent PMH of CAD with anterior MI (1994 s/p PTCA), CABG x3 (1995), PCI x5, persistent A. fib on Eliquis and Tikosyn, HFpEF, HTN, HLD, PAD, ESRD, renal transplant in 1991, COPD (patient of Dr. Lamonte Sakai, on 3 L [place] at home), depression, and T2DM presents to [facility] with syncope and palpitations.  Patient was staying with brother and she had palpitations and syncope. EMS called and found patient with Vtach and vent Bigeminy rhythm. Patient complained of CP and SOB on arrival. Upon arrival to [facility] on 9/25, EKG strip showed polymorhpic VT/torsades. Cards consulted, pt treated with Magnesium & dopamine for mild bradycardia & recommended ICU admit.  Troponin negative, EKG with QTc 565 ms with rate 50 bpm.    PCCM asked to consult for ICU admission and medical management.   Pertinent  Medical History  CAD  Anterior MI - 1994 s/p PTCA, CABGx3 1995 Persistent AF - on eliquis, tikosyn HFpEF HTN HLD  ESRD s/p Renal Transplant 1991  COPD - followed by Dr. Lamonte Sakai  Chronic Hypoxic Respiratory Failure on [place] Hospital Events: Including procedures, antibiotic start and stop dates in addition to other pertinent events   9/26 Admitted to [facility] with prolonged qtc w/ torsades and bradycardia on dopamine  Interim History / Subjective:  Afebrile  Remains on dopamine infusion, weaned to 2.5 mcg  2L [place] Glucose range 166-281 I/O - intake recorded 331ml, no output recorded  Pt reports feeling tired & unwell.  Reports productive cough recently but has not been using home COPD meds as she has been at her brother's house.   Objective   Blood pressure (!) 124/55, pulse 69, temperature 98.1 F (36.7 C), temperature source Oral, resp. rate 16, height 5\' 1"  (1.549 m), weight  96 kg, SpO2 97 %.        Intake/Output Summary (Last 24 hours) at 05/12/2021 1151 Last data filed at 05/12/2021 1000 Gross per 24 hour  Intake 403.94 ml  Output 0 ml  Net 403.94 ml   Filed Weights   05/12/21 0004 05/12/21 0417  Weight: 104.3 kg 96 kg    Examination: General: adult female lying in bed in NAD  HEENT: MM pink/moist, [place] O2, wearing glasses, anicteric  Neuro: Awake / alert, speech clear, MAE  CV: s1s2 RRR, SR 60's on monitor, no m/r/g PULM: non-labored at rest, lungs bilaterally with soft crackles  GI: soft, bsx4 active  Extremities: warm/dry, no edema  Skin: no rashes or lesions  Resolved Hospital Problem list     Assessment & Plan:   Prolonged QT w/ [place] Bradycardia On diltiazem, lamictal, sertraline, lopressor, phenergan at home -ICU monitoring -wean dopamine for HR >50, MAP >54 -goal Mg+ >2, K >4 -follow BMP  -avoid QT prolonging agents   Acute Metabolic Encephalopathy Suspect in setting of hypoperfusion, appears at baseline on 9/26 afternoon exam  -avoid sedating medications -follow neuro exam  -alcohol negative  -UDS pending   Hx of persistent A. Fib on eliquis and tikosyn Hx of HFpEF, CAD, CABG, PCI x5, PAD -appreciate Cardiology  -continue eliquis, plavix -hold tikosyn  AKI on ESRD History of renal transplant with AKI (stage 1) -Trend BMP / urinary output -Replace electrolytes as indicated -Avoid nephrotoxic agents, ensure adequate renal perfusion -continue  transplant meds > azathioprine, prednisone 5mg  QD   Hx of COPD  Hx of OSA Patient of Dr. Lamonte Sakai; on 3 L [place] at home -continue pulmicort, duoneb Q6 -wean O2 for sats >90% -CPAP QHS   DM II  -SSI, very sensitive scale   Hx of HTN, HLD -hold home medications   Hx of depression and anxiety -hold home meds  -hold lamictal as can increase dofetilide concentrations, will need to review with Cardiology    Best Practice (right click and "Reselect all SmartList  Selections" daily)  Diet/type: NPO w/ oral meds DVT prophylaxis: DOAC GI prophylaxis: H2B Lines: N/A Foley:  N/A Code Status:  full code Last date of multidisciplinary goals of care discussion- pending.  Critical care time: [place], MSN, APRN, NP-C, AGACNP-BC [facility] 05/12/2021, 11:57 AM   Please see [email or web address] for pager details.   From 7A-7P if no response, please call [phone] After hours, please call ELink [phone]

## 2021-05-12 NOTE — Progress Notes (Signed)
eLink Physician-Brief Progress Note Patient Name: Katherine [name] DOB: 12/03/1955 MRN: [id number]   Date of Service  05/12/2021  HPI/Events of Note  71 F history of CADs /p stent, CABG (1995), persistent afib on apixaban, HFpEF, hypertension, dyslipidemia, DM, ESRD s/p KT 1991, COPD presented with palpitations and multiple syncopal episode. On EMS arrival patient was in vtach and bigeminy and was complaining of chest pain. vtach/torsades on presentation to the ED given magnesium 2 g and started on dopamine drip for bradycardia. EKG afib rate of 50, Qtc 560.  Patient denies changes in her medications but did say she was sick to her stomach last week.  eICU Interventions  Dopamine to keep QTc < 500 and MAP > 65 Avoid QT prolonging medications Correct electrolytes as needed     Intervention Category Evaluation Type: New Patient Evaluation  Judd Lien 05/12/2021, 4:00 AM

## 2021-05-12 NOTE — ED Provider Notes (Signed)
[facility] EMERGENCY DEPARTMENT Provider Note   CSN: [id number] Arrival date & time: 05/11/21  2352     History Chief Complaint  Patient presents with   Cardiac Arrhythmia    Katherine [name] is a 65 y.o. female.  The history is provided by the patient.  Loss of Consciousness Episode history:  Multiple Most recent episode:  Today Progression:  Waxing and waning Chronicity:  New Relieved by:  None tried Worsened by:  Nothing Associated symptoms: chest pain and shortness of breath   Associated symptoms: no fever   Patient with extensive history including CAD, atrial fibrillation, COPD, ESRD with renal transplant, obesity presents for multiple complaints.  Patient reports throughout the day she was having syncopal episodes.  She reports feeling her heart fluttering and she would pass out in the bed.  She also reports intermittent episodes of chest pain and shortness of breath.  EMS reports the patient was having runs of [name] that would terminate spontaneously in route.   Patient with h/o COPD and on 3LNC at baseline Past Medical History:  Diagnosis Date   Anemia    Anxiety    Bilateral carotid artery stenosis    Carotid duplex 10/7626: 3-15% LICA, 17-61% RICA, >60% RECA, f/u 1 yr suggested   CAD (coronary artery disease) of bypass graft 5/01; 3/'02, 8/'03, 10/'04; 1/15   PCI x 5 to SVG-D1    CAD in native artery 07/1993   3 Vessel Disease (LAD-D1 & RCA) -- CABG (Dx in setting of inferior STEMI-PTCA of RCA)   CAD S/P percutaneous coronary angioplasty    PCI to SVG-D1 insertion/native D1 x 4 = '01 -(S660 BMS 2.5 x 9 anastomosis- D1); '02 - distal overlap ACS Pixel 2.5 x 8  BMS; '03 distal/native ISR/Thrombosis - Pixel 2.5 x 13; '04 - ISR-  Taxus 2.5 x 20 (covered all);; 1/15 - mid SVG-D1 (50% distal ISR) - Promus P 2.75 x 20 -- 2.8 mm   COPD mixed type (HCC)    Followed by Dr. Lamonte Sakai "pulmonologist said no COPD"   Depression    Depression with anxiety     Diabetes mellitus type 2 in obese ([place])    Diarrhea    started after cholecystectomy and mass removed from intestine   Dyslipidemia, goal LDL below 70    08/2012: TC 137, TG 200, HDL 32!, LDL 45; on statin (followed by Dr.Deterding)   ESRD (end stage renal disease) ([place]) 1991   s/p Cadaveric Renal Transplant [facility] - Dr. Jimmy Footman)    Family history of adverse reaction to anesthesia    mom's bp dropped during/after anesthesia   Fibromyalgia    GERD (gastroesophageal reflux disease)    Glomerulonephritis, chronic, rapidly progressive 1989   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 07/1993   Rescue PTCA of RCA -- referred for CABG.   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 Servando Snare: LIMA-LAD, SVG-bifurcatingD1, SVG-rPDA   History of kidney stones    History of stroke 2012   "right eye stroke- half blind now"   Hypertension associated with diabetes ([place])    Mild aortic stenosis by prior echocardiogram 07/2019   Echo:  Mild aortic stenosis (gradients: Mean 14.3 mmHg -peak 24.9 mmHg).   Morbid obesity (HCC)    MRSA (methicillin resistant staph aureus) culture positive    OSA (obstructive sleep apnea)    no  longer on CPAP or home O2, states she doesn't need now after lap band   PAD (peripheral artery disease) ([place]) 08/2013   LEA Dopplers to be read by Dr. Fletcher Anon   PAF (paroxysmal atrial fibrillation) ([place]) 06/2014   Noted on CardioNet Monitor  - --> rhythm control with Tikosyn (Dr. Rayann Heman); converted from warfarin to apixaban for anticoagulation.   Pneumonia    Recurrent boils    Bilateral Groin   Rheumatoid arthritis ([place])    Per Patient Report; associated with OA   S/p cadaver renal transplant 1991   [facility]   Unstable angina ([place]) 5/01; 3/'02, 8/'03, 10/'04; 1/15   x 5 occurences since Inf-Post STEMI in 1994    Patient Active Problem List   Diagnosis Date Noted   Nausea & vomiting 04/19/2021   Acute  kidney injury ([place]) 04/19/2021   Viral syndrome 03/21/2021   Pure hypercholesterolemia    Coronary stent restenosis due to scar tissue    Unstable angina ([place]) 02/19/2021   Acute on chronic diastolic CHF (congestive heart failure) ([place]) 01/29/2021   Acute CHF (congestive heart failure) ([place]) 01/29/2021   Acute on chronic systolic CHF (congestive heart failure) ([place]) 01/29/2021   Non-ST elevation (NSTEMI) myocardial infarction [facility])    Chronic respiratory failure with hypoxia ([place]) 01/01/2021   Flu vaccine need 08/13/2020   Need for pneumococcal vaccination 08/13/2020   Visit for screening mammogram 08/13/2020   Candidal skin infection 08/13/2020   GAD (generalized anxiety disorder) 08/13/2020   Chronic bronchitis, mucopurulent ([place]) 08/24/2019   Current chronic use of systemic steroids 07/23/2019   OSA (obstructive sleep apnea)    Severe episode of recurrent major depressive disorder, with psychotic features ([place]) 02/16/2019   Obstructive chronic bronchitis without exacerbation ([place]) 11/03/2018   Long term (current) use of anticoagulants 04/28/2018   Renal transplant, status post 01/28/2018   LBBB (left bundle branch block) 10/07/2017   Primary osteoarthritis of right knee 08/18/2017   Shortness of breath 06/18/2017   Mild aortic stenosis by prior echocardiogram 01/28/2017   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   Deficiency anemia 10/14/2016   Normocytic anemia 10/13/2016   Fibromyalgia 03/30/2016   Other spondylosis with radiculopathy, lumbar region 03/30/2016   Type 2 diabetes mellitus with peripheral neuropathy ([place]) 03/30/2016   Paroxysmal atrial fibrillation ([place]); CHA2DS2VASc score F, HTN, CAD, CVA = 5 06/28/2014   Hypertension associated with diabetes ([place])    Chest pain 08/23/2013   PAD (peripheral artery disease) ([place]) 08/17/2013   Stenosis of right carotid artery without infarction 10/08/2012   Dyslipidemia, goal LDL below 70 10/08/2012    Class: Diagnosis of   Mitral annular  calcification 10/08/2012   Hypomagnesemia 06/13/2012   Renal transplant disorder 06/12/2012   Chronic allergic rhinitis 04/29/2011   Extrinsic asthma 09/09/2007   GERD 09/09/2007   COUGH, CHRONIC 09/09/2007   Morbid obesity - s/p Lap Band 9/'05 05/07/2004    Class: Diagnosis of   Hx of CABG 09/07/1993    Class: History of   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 07/1993   CAD in native artery 07/1993   ESRD (end stage renal disease) ([place]) 1991    Past Surgical History:  Procedure Laterality Date   ABDOMINAL AORTOGRAM N/A 04/21/2018   Procedure: ABDOMINAL AORTOGRAM;  Surgeon: [name], MD;  Location: [place] CV LAB;  Service: Cardiovascular;  Laterality: N/A;   CATHETER REMOVAL     CHOLECYSTECTOMY N/A 10/29/2014   Procedure: LAPAROSCOPIC CHOLECYSTECTOMY WITH INTRAOPERATIVE CHOLANGIOGRAM;  Surgeon:  Excell Seltzer, MD;  Location: [facility] ORS;  Service: General;  Laterality: N/A;   CORONARY ANGIOPLASTY  1994   x5   CORONARY ARTERY BYPASS GRAFT  1995   LIMA-LAD, SVG-RPDA, SVG-D1   CORONARY STENT INTERVENTION N/A 02/20/2021   Procedure: CORONARY STENT INTERVENTION;  Surgeon: [name], MD;  Location: [place] CV LAB;  Service: Cardiovascular;  Laterality: N/A;   ESOPHAGOGASTRODUODENOSCOPY N/A 10/15/2016   Procedure: ESOPHAGOGASTRODUODENOSCOPY (EGD);  Surgeon: Wilford Corner, MD;  Location: [facility] ENDOSCOPY;  Service: Endoscopy;  Laterality: N/A;   I & D EXTREMITY Right 01/29/2018   Procedure: IRRIGATION AND DEBRIDEMENT THUMB;  Surgeon: Dayna Barker, MD;  Location: [place];  Service: Plastics;  Laterality: Right;   INCISE AND DRAIN ABCESS     INTRAVASCULAR PRESSURE WIRE/FFR STUDY N/A 02/20/2021   Procedure: INTRAVASCULAR PRESSURE WIRE/FFR STUDY;  Surgeon: [name], MD;  Location: [place] CV LAB;  Service: Cardiovascular;  Laterality: N/A;   KIDNEY TRANSPLANT  1991   KNEE ARTHROSCOPY WITH LATERAL MENISECTOMY Left 12/03/2017   Procedure:  LEFT KNEE ARTHROSCOPY WITH LATERAL MENISECTOMY;  Surgeon: Earlie Server, MD;  Location: [name];  Service: Orthopedics;  Laterality: Left;   LAPAROSCOPIC GASTRIC BANDING  04/2004; 10/'09, 2/'10   Port Replacement x 2   LEFT HEART CATH AND CORONARY ANGIOGRAPHY N/A 02/20/2021   Procedure: LEFT HEART CATH AND CORONARY ANGIOGRAPHY;  Surgeon: [name], MD;  Location: [place] CV LAB;  Service: Cardiovascular;  Laterality: N/A;   LEFT HEART CATH AND CORS/GRAFTS ANGIOGRAPHY N/A 04/21/2018   Procedure: LEFT HEART CATH AND CORS/GRAFTS ANGIOGRAPHY;  Surgeon: [name], MD;  Location: [place] CV LAB;  Ost-Prox LAD 50% - proxLAD (pre & post D1) 100% CTO. Cx - patent, small OM1 (stable ~ ostial OM1 90%, too small for PCI) & 2 small LPL; Ost-distal RCA 100% CTO.  LIMA-LAD (not injected); SVG-dRCA patent, SVG-D1 - insertion stent ~20% ISR - Severe R CFA disease w/ focal Sub TO   LEFT HEART CATH AND CORS/GRAFTS ANGIOGRAPHY  5/'01, 3/'02, 8/'03, 10/'04; 1/'15   08/22/2013: LAD & RCA 100%; LIMA-LAD & SVG-rPDA patent; Cx-- OM1 60%, OM2 ostial ~50%; SVG-D1 - 80% mid, 50% distal ISR --PCI   LEFT HEART CATH AND CORS/GRAFTS ANGIOGRAPHY N/A 01/31/2021   Procedure: LEFT HEART CATH AND CORS/GRAFTS ANGIOGRAPHY;  Surgeon: Jolaine Artist, MD;  Location: [place] CV LAB;  Service: Cardiovascular;  Laterality: N/A;   LEFT HEART CATHETERIZATION WITH CORONARY/GRAFT ANGIOGRAM N/A 08/23/2013   Procedure: LEFT HEART CATHETERIZATION WITH Beatrix Fetters;  Surgeon: Wellington Hampshire, MD;  Location: [place] CATH LAB;  Service: Cardiovascular;  Laterality: N/A;   Lower Extremity Arterial Dopplers  08/2013   ABI: R 0.96, L 1.04   MULTIPLE TOOTH EXTRACTIONS  age 89   NM MYOVIEW LTD  03/2016   EF 62%. LOW RISK. C/W prior MI - no Ischemia. Apical hypokinesis.   PERCUTANEOUS CORONARY STENT INTERVENTION (PCI-S)  5/'01, 3/'02, 8/'03, 10/'04;   '01 - S660 BMS 2.5 x 9 - dSVG-D1 into D1; '02- post-stent stenosis - 2.5 x 8  Pixel BMS; '8\03: ISR/Thrombosis into native D1 - AngioJet, 2.5 x 13 Pixel; '04 - ISR 95% - covered stented area with Taxus DES 2.5 mm x 20 (2.88)   PERCUTANEOUS CORONARY STENT INTERVENTION (PCI-S)  08/23/2013   Procedure: PERCUTANEOUS CORONARY STENT INTERVENTION (PCI-S);  Surgeon: Wellington Hampshire, MD;  Location[facility] CATH LAB;  Service: Cardiovascular;;mid SVG-D1 80%; distal stent ~50% ISR; Promus [place] DES 2.75 mm  xc 20 mm (2.8 mm)   PORT-A-CATH REMOVAL     kidney   TRANSTHORACIC ECHOCARDIOGRAM  07/2019   EF 55 to 60%.  No LVH.  Paradoxical septal wall motion-post CABG.  GRII DD.  Normal RV size and function.  Mild bilateral atrial dilation.  Moderate MAC.  Trace MR.  Mild aortic stenosis (gradients: Mean 14.3 mmHg -peak 24.9 mmHg).   TUBAL LIGATION     wrist fistula repair Left    dialysis for one year     OB History   No obstetric history on file.     Family History  Problem Relation Age of Onset   Cancer Mother        liver   Heart disease Father    Cancer Father        colon   Arrhythmia Brother        Atrial Fibrillation   Arrhythmia Paternal Aunt        Atrial Fibrillation    Social History   Tobacco Use   Smoking status: Every Day    Packs/day: 1.00    Years: 30.00    Pack years: 30.00    Types: Cigarettes    Last attempt to quit: 08/17/2002    Years since quitting: 18.7   Smokeless tobacco: Never  Vaping Use   Vaping Use: Never used  Substance Use Topics   Alcohol use: No   Drug use: No    Home Medications Prior to Admission medications   Medication Sig Start Date End Date Taking? Authorizing Provider  acetaminophen (TYLENOL) 500 MG tablet Take 1,500 mg by mouth 2 (two) times daily.    [provider]  albuterol (VENTOLIN HFA) 108 (90 Base) MCG/ACT inhaler TAKE 2 PUFFS BY MOUTH EVERY 6 HOURS AS NEEDED FOR WHEEZE OR SHORTNESS OF BREATH Patient taking differently: Inhale 2 puffs into the lungs every 6 (six) hours as needed for wheezing or shortness  of breath. 02/10/21   Byrum, Rose Fillers, MD  apixaban (ELIQUIS) 5 MG TABS tablet Take 1 tablet (5 mg total) by mouth 2 (two) times daily. APPOINTMENT NEEDED WITH CARDIOLOGIST FOR FURTHER REFILLS 04/17/21   Sherran Needs, NP  arformoterol (BROVANA) 15 MCG/2ML NEBU Take 2 mLs (15 mcg total) by nebulization 2 (two) times daily. 08/30/19   Collene Gobble, MD  azaTHIOprine (IMURAN) 50 MG tablet Take 125 mg by mouth See admin instructions. Take 2 1/2 tablets (125 mg) by mouth daily at 3pm    [provider]  budesonide (PULMICORT) 0.5 MG/2ML nebulizer solution Take 2 mLs (0.5 mg total) by nebulization 2 (two) times daily. 08/24/19   [name], MD  calcitRIOL (ROCALTROL) 0.25 MCG capsule Take 0.25 mcg by mouth every 3 (three) days.     [provider]  cetirizine (ZYRTEC) 5 MG tablet Take 5 mg by mouth daily.    [provider]  clopidogrel (PLAVIX) 75 MG tablet Take 1 tablet (75 mg total) by mouth daily. 02/22/21   Cheryln Manly, NP  clotrimazole (MYCELEX) 10 MG troche Take 1 tablet (10 mg total) by mouth 5 (five) times daily as needed (thrush). Patient not taking: Reported on 05/07/2021 02/03/21   Edwin Dada, MD  cyclobenzaprine (FLEXERIL) 10 MG tablet TAKE 2 TAB DAILY AT BEDTIME, MAY ALSO TAKE 1 TABLET BY MOUTH AT NOON AS NEEDED FOR MUSCLE SPASMS Patient taking differently: Take 5 mg by mouth 2 (two) times daily as needed. 04/18/21   Meredith Staggers, MD  diclofenac Sodium (VOLTAREN) 1 % GEL Apply 2 g topically 4 (four) times daily. Patient not taking: No sig reported 03/22/21   Debbe Odea, MD  diltiazem (CARDIZEM) 30 MG tablet take 1 tablet every 4 hours AS NEEDED for afib heart rate >100 as long as top blood pressure >100. Patient not taking: Reported on 05/07/2021 03/04/21   Sherran Needs, NP  dofetilide (TIKOSYN) 250 MCG capsule Take 1 capsule (250 mcg total) by mouth 2 (two) times daily. 03/04/21   Sherran Needs, NP  DULoxetine (CYMBALTA) 30 MG capsule  TAKE 2 CAPSULES (60 MG TOTAL) BY MOUTH EVERY MORNING. Patient taking differently: Take 60 mg by mouth every morning. 10/30/20   Bayard Hugger, NP  fluticasone (FLONASE) 50 MCG/ACT nasal spray PLACE 2 SPRAYS INTO BOTH NOSTRILS 2 TIMES DAILY Patient taking differently: Place 2 sprays into both nostrils 2 (two) times daily. 12/17/20   Collene Gobble, MD  furosemide (LASIX) 40 MG tablet Take 1 tablet (40 mg total) by mouth daily. Patient not taking: No sig reported 07/14/20   Dwyane Dee, MD  glimepiride (AMARYL) 4 MG tablet Take 4 mg by mouth daily. 02/03/20   [provider]  insulin aspart (NOVOLOG FLEXPEN) 100 UNIT/ML FlexPen Inject 14 Units into the skin 3 (three) times daily with meals.    [provider]  Insulin Glargine (BASAGLAR KWIKPEN) 100 UNIT/ML SOPN Inject 12 Units into the skin daily before breakfast.    [provider]  isosorbide mononitrate (IMDUR) 60 MG 24 hr tablet Take 1 tablet (60 mg total) by mouth daily. 03/22/21   Debbe Odea, MD  lamoTRIgine (LAMICTAL) 25 MG tablet TAKE 1 TABLET BY MOUTH EVERYDAY AT BEDTIME Patient taking differently: Take 25 mg by mouth at bedtime. 03/05/21   Meredith Staggers, MD  LORazepam (ATIVAN) 1 MG tablet Take 1 tablet (1 mg total) by mouth 2 (two) times daily. 03/20/21   [name], MD  magnesium oxide (MAG-OX) 400 (240 Mg) MG tablet Take 1 tablet (400 mg total) by mouth daily. Patient not taking: Reported on 05/07/2021 04/23/21   Flora Lipps, MD  metFORMIN (GLUCOPHAGE) 500 MG tablet Take 1 tablet (500 mg total) by mouth 2 (two) times daily. 02/22/21   Burnell Blanks, MD  metoprolol tartrate (LOPRESSOR) 100 MG tablet Take 1 tablet (100 mg total) by mouth 2 (two) times daily. Appointment Required For Further Refills [phone] 03/22/21   Debbe Odea, MD  montelukast (SINGULAIR) 10 MG tablet TAKE 1 TABLET BY MOUTH EVERYDAY AT BEDTIME Patient taking differently: Take 10 mg by mouth at bedtime. 02/10/21   Collene Gobble, MD  nitroGLYCERIN (NITROSTAT) 0.4 MG SL tablet Place 1 tablet (0.4 mg total) under the tongue every 5 (five) minutes as needed for chest pain. 02/21/21   Burnell Blanks, MD  Oxycodone HCl 10 MG TABS Take 1 tablet (10 mg total) by mouth 4 (four) times daily as needed. Patient taking differently: Take 10 mg by mouth 4 (four) times daily as needed. Taking 70m twice daily 04/28/21   TBayard Hugger NP  OXYGEN Inhale 3 L into the lungs continuous.    [provider]  pantoprazole (PROTONIX) 40 MG tablet Take 1 tablet (40 mg total) by mouth daily. 02/21/21 05/22/21  RCheryln Manly NP  phenazopyridine (PYRIDIUM) 100 MG tablet Take 1 tablet (100 mg total) by mouth 3 (three) times daily with meals. Patient not taking: Reported on 05/07/2021 04/22/21   PFlora Lipps MD  predniSONE (  DELTASONE) 5 MG tablet Take 5 mg by mouth every morning. 06/19/19   [provider]  promethazine (PHENERGAN) 25 MG tablet Take 1 tablet (25 mg total) by mouth at bedtime as needed for nausea. For nausea and sleep 07/14/20   Dwyane Dee, MD  rosuvastatin (CRESTOR) 40 MG tablet Take 1 tablet (40 mg total) by mouth daily. Patient taking differently: Take 20 mg by mouth daily. 02/03/21   Danford, Suann Larry, MD  sertraline (ZOLOFT) 100 MG tablet Take 100 mg by mouth every morning. 06/12/20   [provider]    Allergies    Tetracycline, Niacin, Niaspan [niacin er], Sulfa antibiotics, Sulfonamide derivatives, Codeine, Erythromycin, Hydromorphone hcl, Morphine and related, Nalbuphine, Sulfasalazine, and Tape  Review of Systems   Review of Systems  Constitutional:  Negative for fever.  Respiratory:  Positive for shortness of breath.   Cardiovascular:  Positive for chest pain and syncope.  Musculoskeletal:  Positive for back pain.  Neurological:  Positive for syncope.  All other systems reviewed and are negative.  Physical Exam Updated Vital Signs BP (!) 121/58   Pulse (!)  53   Temp (!) 97.5 F (36.4 C) (Temporal)   Resp 16   Ht 1.549 m (_0 )   Wt 104.3 kg   SpO2 94%   BMI 43.46 kg/m   Physical Exam CONSTITUTIONAL: Chronically ill-appearing, no acute distress HEAD: Normocephalic/atraumatic EYES: EOMI ENMT: Mask in place NECK: supple no meningeal signs SPINE/BACK:entire spine nontender CV: S1/S2 noted, no murmurs/rubs/gallops noted LUNGS: Lungs are clear to auscultation bilaterally, no apparent distress ABDOMEN: soft, nontender, obese GU:no cva tenderness NEURO: Pt is awake/alert/appropriate, moves all extremitiesx4.  No facial droop.   EXTREMITIES: full ROM SKIN: warm, color normal PSYCH: Mildly anxious  ED Results / Procedures / Treatments   Labs (all labs ordered are listed, but only abnormal results are displayed) Labs Reviewed  BASIC METABOLIC PANEL - Abnormal; Notable for the following components:      Result Value   Sodium 133 (*)    CO2 21 (*)    Glucose, Bld 264 (*)    Creatinine, Ser 1.29 (*)    Calcium 8.7 (*)    GFR, Estimated 47 (*)    All other components within normal limits  CBC - Abnormal; Notable for the following components:   WBC 3.9 (*)    RBC 3.26 (*)    Hemoglobin 9.8 (*)    HCT 31.7 (*)    RDW 18.7 (*)    All other components within normal limits  RESP PANEL BY RT-PCR (FLU A&B, COVID) ARPGX2  MAGNESIUM  CALCIUM, IONIZED  TROPONIN I (HIGH SENSITIVITY)  TROPONIN I (HIGH SENSITIVITY)    EKG EKG Interpretation  Date/Time:  Sunday May 11 2021 23:54:40 EDT Ventricular Rate:  51 PR Interval:  174 QRS Duration: 97 QT Interval:  613 QTC Calculation: 565 R Axis:   -12 Text Interpretation: Sinus rhythm Atrial premature complex Abnormal R-wave progression, early transition Left ventricular hypertrophy Nonspecific T abnormalities, diffuse leads Prolonged QT interval Confirmed by Ripley Fraise [phone]) on 05/12/2021 12:24:06 AM  Radiology DG Chest Portable 1 View  Result Date: 05/12/2021 CLINICAL  DATA:  Chest pain EXAM: PORTABLE CHEST 1 VIEW COMPARISON:  04/19/2021 FINDINGS: Lungs are clear. No pneumothorax or pleural effusion. Coronary artery bypass grafting has been performed. Cardiac size is mildly enlarged, unchanged. No acute bone abnormality. IMPRESSION: No active disease.  Stable cardiomegaly. Electronically Signed   By: Fidela Salisbury M.D.   On: 05/12/2021 00:40  Procedures .Critical Care Performed by: Ripley Fraise, MD Authorized by: Ripley Fraise, MD   Critical care provider statement:    Critical care time (minutes):  76   Critical care start time:  05/12/2021 1:14 AM   Critical care end time:  05/12/2021 2:00 AM   Critical care time was exclusive of:  Separately billable procedures and treating other patients   Critical care was necessary to treat or prevent imminent or life-threatening deterioration of the following conditions:  Cardiac failure, circulatory failure and renal failure   Critical care was time spent personally by me on the following activities:  Development of treatment plan with patient or surrogate, discussions with consultants, discussions with primary provider, examination of patient, evaluation of patient's response to treatment, pulse oximetry, ordering and review of radiographic studies, ordering and review of laboratory studies, ordering and performing treatments and interventions, re-evaluation of patient's condition and review of old charts   I assumed direction of critical care for this patient from another provider in my specialty: no     Care discussed with: admitting provider     Medications Ordered in ED Medications  magnesium sulfate IVPB 2 g 50 mL (2 g Intravenous New Bag/Given 05/12/21 0056)  DOPamine (INTROPIN) 800 mg in dextrose 5 % 250 mL (3.2 mg/mL) infusion (5 mcg/kg/min  104.3 kg Intravenous New Bag/Given 05/12/21 0101)    ED Course  I have reviewed the triage vital signs and the nursing notes.  Pertinent labs & imaging results  that were available during my care of the patient were reviewed by me and considered in my medical decision making (see chart for details).    MDM Rules/Calculators/A&P                           Patient reports palpitations and syncope earlier in the day now reporting recent chest pain.  Prehospital EKG does reveal episodes of wide-complex tachycardia Will consult cardiology      1:15 AM Discussed the case with cardiology fellow Dr. Kalman Shan.  After review of EKGs, it appears the patient is having polymorphic [name]/torsades 's recommends magnesium 2 g IV push.  Due to her mild bradycardia he also recommends dopamine IV.  She is on multiple medications that can prolong QT. Patient is awake and alert this time was updated on plan 2:04 AM Discussed the case with cardiology Dr. Kalman Shan.  He suspect this is all due to medications.  He requests critical care admission.  Discussed with critical care team who will evaluate for admission.  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

## 2021-05-12 NOTE — ED Triage Notes (Signed)
Pt BIB [facility] from home. Initial call out due to syncopal episodes throughout the day. EMS reports runs of V tach and Ventricular Bigeminy. Pt c./o CP and SOB on arrival.

## 2021-05-12 NOTE — ED Notes (Signed)
Confirmed with intensivists PA RN to give additional 2 G Mg as ordered by Cardiologist for a total of 4 g of Mg

## 2021-05-13 ENCOUNTER — Encounter (HOSPITAL_COMMUNITY): Payer: Self-pay | Admitting: Pulmonary Disease

## 2021-05-13 DIAGNOSIS — I472 Ventricular tachycardia: Secondary | ICD-10-CM | POA: Diagnosis not present

## 2021-05-13 LAB — CBC
HCT: 27.5 % — ABNORMAL LOW (ref 36.0–46.0)
Hemoglobin: 8.8 g/dL — ABNORMAL LOW (ref 12.0–15.0)
MCH: 30.2 pg (ref 26.0–34.0)
MCHC: 32 g/dL (ref 30.0–36.0)
MCV: 94.5 fL (ref 80.0–100.0)
Platelets: 250 10*3/uL (ref 150–400)
RBC: 2.91 MIL/uL — ABNORMAL LOW (ref 3.87–5.11)
RDW: 18.4 % — ABNORMAL HIGH (ref 11.5–15.5)
WBC: 5.5 10*3/uL (ref 4.0–10.5)
nRBC: 0 % (ref 0.0–0.2)

## 2021-05-13 LAB — COMPREHENSIVE METABOLIC PANEL
ALT: 10 U/L (ref 0–44)
AST: 14 U/L — ABNORMAL LOW (ref 15–41)
Albumin: 2.1 g/dL — ABNORMAL LOW (ref 3.5–5.0)
Alkaline Phosphatase: 94 U/L (ref 38–126)
Anion gap: 9 (ref 5–15)
BUN: 13 mg/dL (ref 8–23)
CO2: 24 mmol/L (ref 22–32)
Calcium: 8.8 mg/dL — ABNORMAL LOW (ref 8.9–10.3)
Chloride: 106 mmol/L (ref 98–111)
Creatinine, Ser: 1.13 mg/dL — ABNORMAL HIGH (ref 0.44–1.00)
GFR, Estimated: 55 mL/min — ABNORMAL LOW (ref >60)
Glucose, Bld: 92 mg/dL (ref 70–99)
Potassium: 3.1 mmol/L — ABNORMAL LOW (ref 3.5–5.1)
Sodium: 139 mmol/L (ref 135–145)
Total Bilirubin: 0.6 mg/dL (ref 0.3–1.2)
Total Protein: 5.9 g/dL — ABNORMAL LOW (ref 6.5–8.1)

## 2021-05-13 LAB — BASIC METABOLIC PANEL
Anion gap: 11 (ref 5–15)
BUN: 14 mg/dL (ref 8–23)
CO2: 21 mmol/L — ABNORMAL LOW (ref 22–32)
Calcium: 8.4 mg/dL — ABNORMAL LOW (ref 8.9–10.3)
Chloride: 102 mmol/L (ref 98–111)
Creatinine, Ser: 1.16 mg/dL — ABNORMAL HIGH (ref 0.44–1.00)
GFR, Estimated: 53 mL/min — ABNORMAL LOW (ref >60)
Glucose, Bld: 177 mg/dL — ABNORMAL HIGH (ref 70–99)
Potassium: 4.2 mmol/L (ref 3.5–5.1)
Sodium: 134 mmol/L — ABNORMAL LOW (ref 135–145)

## 2021-05-13 LAB — MAGNESIUM: Magnesium: 2.4 mg/dL (ref 1.7–2.4)

## 2021-05-13 LAB — TROPONIN I (HIGH SENSITIVITY)
Troponin I (High Sensitivity): 19 ng/L — ABNORMAL HIGH (ref <18)
Troponin I (High Sensitivity): 40 ng/L — ABNORMAL HIGH (ref <18)

## 2021-05-13 LAB — GLUCOSE, CAPILLARY
Glucose-Capillary: 75 mg/dL (ref 70–99)
Glucose-Capillary: 72 mg/dL (ref 70–99)
Glucose-Capillary: 200 mg/dL — ABNORMAL HIGH (ref 70–99)
Glucose-Capillary: 227 mg/dL — ABNORMAL HIGH (ref 70–99)
Glucose-Capillary: 200 mg/dL — ABNORMAL HIGH (ref 70–99)

## 2021-05-13 MED ORDER — OXYCODONE HCL 5 MG PO TABS
5.0000 mg | ORAL_TABLET | Freq: Two times a day (BID) | ORAL | Status: DC
Start: 2021-05-13 — End: 2021-05-13

## 2021-05-13 MED ORDER — ISOSORBIDE MONONITRATE ER 60 MG PO TB24
60.0000 mg | ORAL_TABLET | Freq: Every day | ORAL | Status: DC
  Administered 2021-05-13 – 2021-05-18 (×6): 60 mg via ORAL
  Filled 2021-05-13 (×6): qty 1

## 2021-05-13 MED ORDER — POTASSIUM CHLORIDE CRYS ER 20 MEQ PO TBCR
20.0000 meq | EXTENDED_RELEASE_TABLET | ORAL | Status: AC
Start: 2021-05-13 — End: 2021-05-13
  Administered 2021-05-13 (×2): 20 meq via ORAL
  Filled 2021-05-13 (×2): qty 1

## 2021-05-13 MED ORDER — IPRATROPIUM-ALBUTEROL 0.5-2.5 (3) MG/3ML IN SOLN: 3.0000 mL | Freq: Four times a day (QID) | RESPIRATORY_TRACT | Status: DC | PRN

## 2021-05-13 MED ORDER — POTASSIUM CHLORIDE 10 MEQ/100ML IV SOLN
10.0000 meq | INTRAVENOUS | Status: DC
  Administered 2021-05-13: 10 meq via INTRAVENOUS
  Filled 2021-05-13 (×2): qty 100

## 2021-05-13 MED ORDER — METOPROLOL TARTRATE 50 MG PO TABS
100.0000 mg | ORAL_TABLET | Freq: Two times a day (BID) | ORAL | Status: DC
  Administered 2021-05-13 – 2021-05-18 (×11): 100 mg via ORAL
  Filled 2021-05-13 (×12): qty 2

## 2021-05-13 MED ORDER — INSULIN ASPART 100 UNIT/ML IJ SOLN: 0.0000 [IU] | Freq: Three times a day (TID) | INTRAMUSCULAR | Status: DC

## 2021-05-13 MED ORDER — INSULIN ASPART 100 UNIT/ML IJ SOLN
0.0000 [IU] | Freq: Three times a day (TID) | INTRAMUSCULAR | Status: DC
  Administered 2021-05-14 (×2): 1 [IU] via SUBCUTANEOUS
  Administered 2021-05-14: 3 [IU] via SUBCUTANEOUS

## 2021-05-13 MED ORDER — ARFORMOTEROL TARTRATE 15 MCG/2ML IN NEBU
15.0000 ug | INHALATION_SOLUTION | Freq: Two times a day (BID) | RESPIRATORY_TRACT | Status: DC
  Administered 2021-05-13 – 2021-05-18 (×11): 15 ug via RESPIRATORY_TRACT
  Filled 2021-05-13 (×11): qty 2

## 2021-05-13 MED ORDER — MORPHINE SULFATE (PF) 2 MG/ML IV SOLN
1.0000 mg | Freq: Once | INTRAVENOUS | Status: DC
  Filled 2021-05-13: qty 1

## 2021-05-13 MED ORDER — SODIUM CHLORIDE 0.9 % IV SOLN
12.5000 mg | Freq: Once | INTRAVENOUS | Status: DC
  Filled 2021-05-13: qty 0.5

## 2021-05-13 MED ORDER — POTASSIUM CHLORIDE CRYS ER 20 MEQ PO TBCR
40.0000 meq | EXTENDED_RELEASE_TABLET | Freq: Once | ORAL | Status: AC
  Administered 2021-05-13: 40 meq via ORAL
  Filled 2021-05-13: qty 2

## 2021-05-13 MED ORDER — MAGNESIUM OXIDE -MG SUPPLEMENT 400 (240 MG) MG PO TABS: 400.0000 mg | ORAL_TABLET | Freq: Every day | ORAL | Status: DC

## 2021-05-13 NOTE — Progress Notes (Signed)
K+ 3.1 Replaced per protocol

## 2021-05-13 NOTE — Consult Note (Signed)
   [facility] Inpatient Consult   05/13/2021  Katherine [name] [birth year] [id number]  [facility] [ACO] Patient: Medicare CMS DCE  Primary Care Provider:  [name], MD, Embedded [facility]  Patient is active in an Embedded practice with chronic disease management with the Embedded Care Management team. Patient is currently in ICU.  Plan: Notification to  be sent to the [name] team member and made aware of patient's admission.  Please contact for further questions,  Natividad Brood, RN BSN [facility] Liaison  [phone] business mobile phone Toll free office [phone]  Fax number: [phone] [name]

## 2021-05-13 NOTE — Progress Notes (Signed)
RT placed patient on CPAP with 3L O2 bled into circuit. Patient tolerating settings well at this time. RT will monitor as needed.

## 2021-05-13 NOTE — Progress Notes (Addendum)
NAME:  Katherine [name], MRN:  [id number], DOB:  [birth year], LOS: 1 ADMISSION DATE:  05/11/2021, CONSULTATION DATE:  9/26 REFERRING MD:  Dr. Christy Gentles, CHIEF COMPLAINT:  Prolonged qtc/bradycardia   History of Present Illness:  65 yo F w/ pertinent PMH of CAD with anterior MI (1994 s/p PTCA), CABG x3 (1995), PCI x5, persistent A. fib on Eliquis and Tikosyn, HFpEF, HTN, HLD, PAD, ESRD, renal transplant in 1991, COPD (patient of Dr. Lamonte Sakai, on 3 L [place] at home), depression, and T2DM presents to [facility] with syncope and palpitations.  Patient was staying with brother and she had palpitations and syncope. EMS called and found patient with Vtach and vent Bigeminy rhythm. Patient complained of CP and SOB on arrival. Upon arrival to [facility] on 9/25, EKG strip showed polymorhpic VT/torsades. Cards consulted, pt treated with Magnesium & dopamine for mild bradycardia & recommended ICU admit.  Troponin negative, EKG with QTc 565 ms with rate 50 bpm.    PCCM asked to consult for ICU admission and medical management.   Pertinent  Medical History  CAD  Anterior MI - 1994 s/p PTCA, CABGx3 1995 Persistent AF - on eliquis, tikosyn HFpEF HTN HLD  ESRD s/p Renal Transplant 1991  COPD - followed by Dr. Lamonte Sakai  Chronic Hypoxic Respiratory Failure on [place] Hospital Events: Including procedures, antibiotic start and stop dates in addition to other pertinent events   9/26 Admitted to [facility] with prolonged qtc w/ torsades and bradycardia on dopamine.  Off dopamine in afternoon.  On 2L.   Interim History / Subjective:  Tmax 99.1 On 3L.  Wore bipap briefly overnight  I/O 1.2L UOP, -1L in last 24 hours  Pt reports "chest pain" this am.  Notes it is central in her chest, radiates into jaw and shoulder.  Asking for pain medication and ativan at the same time.   No change in vitals / stable off dopamine.   Objective   Blood pressure (!) 171/78, pulse 96, temperature 99.1 F (37.3 C), temperature source Oral, resp.  rate (!) 22, height 5\' 1"  (1.549 m), weight 96 kg, SpO2 98 %.        Intake/Output Summary (Last 24 hours) at 05/13/2021 7616 Last data filed at 05/13/2021 0400 Gross per 24 hour  Intake 153.31 ml  Output 1200 ml  Net -1046.69 ml   Filed Weights   05/12/21 0004 05/12/21 0417  Weight: 104.3 kg 96 kg    Examination:  General: adult female lying in bed in NAD  HEENT: MM pink/moist, anicteric Neuro: AAOx4, speech clear, MAE  CV: s1s2 regular, SR / intermittent PAF on monitor, no m/r/g PULM:  non-labored on 3L [place], lungs bilaterally clear  GI: soft, bsx4 active, tolerating PO's  Extremities: warm/dry, no edema  Skin: no rashes or lesions  Resolved Hospital Problem list     Assessment & Plan:   Prolonged QT w/ Torasades de Pointes Bradycardia On diltiazem, lamictal, sertraline, lopressor, phenergan at home -transfer to tele for monitoring pending troponin, EKG review  -goal K+ >4, Mg+ >2 -trend BMP  -avoid QT prolonging agents > will need to review psychiatric meds with Cardiology   Chest Pain  -now EKG -assess troponin  -morphine 1mg  x1 for pain  -if above negative, consider GI cocktail  Acute Metabolic Encephalopathy - resolved 9/27 Suspect in setting of hypoperfusion. ETOH negative.  -minimize sedating medications  -PT consult  -UDS not sent  -follow clinical exam   Hx of persistent A. Fib on eliquis and  tikosyn Hx of HFpEF, CAD, CABG, PCI x5, PAD -appreciate Cardiology evaluation  -keep in ICU overnight with wide complex rhythm per Cardiology  -repeat EKG this am for QTc - 447ms -continue plavix, eliquis  -defer anti-arrhythmics vs rate control to Cardiology   AKI on ESRD History of renal transplant with AKI  Hypokalemia -continue transplant medications > prednisone, azathioprine  -Trend BMP / urinary output -Replace electrolytes as indicated, KCL 9/27.  -follow up BMP at 1400 -Avoid nephrotoxic agents, ensure adequate renal perfusion  Hx of COPD  Hx  of OSA Patient of Dr. Lamonte Sakai; on 3 L [place] at home -pulmicort BID  -resume brovana BID  -duoneb PRN  -CPAP QHS  -wean O2 for sats >90%  DM II  -SSI, very sensitive scale  -hold home glimepiride   Hx of HTN, HLD -resume home lopressor, imdur -continue crestor  -hold other home meds  Hx of depression, anxiety & Fibromyalgia  Loss of her spouse & brother in last 2 years -continue home ativan -reviewed medication list with Dr. Naaman Plummer > plan to not restart cymbalta or lamictal at discharge.  In addition would plan for zoloft 50mg  closer to  discharge and they will up titrate as outpatient  -hold cymblta, flexeril, lamictal, zoloft for now -PRN oxycodone  Best Practice (right click and "Reselect all SmartList Selections" daily)  Diet/type: Regular consistency (see orders) DVT prophylaxis: DOAC GI prophylaxis: H2B Lines: N/A Foley:  N/A Code Status:  full code Last date of multidisciplinary goals of care discussion- n/a  Transfer primary service to [facility] as of 9/28 am.  PCCM will sign off.   Critical care time: n/a   Noe Gens, MSN, APRN, NP-C, AGACNP-BC [facility] 05/13/2021, 7:22 AM   Please see [email or web address] for pager details.   From 7A-7P if no response, please call [phone] After hours, please call ELink [phone]

## 2021-05-13 NOTE — Plan of Care (Signed)
?  Problem: Clinical Measurements: ?Goal: Ability to maintain clinical measurements within normal limits will improve ?Outcome: Progressing ?Goal: Will remain free from infection ?Outcome: Progressing ?Goal: Diagnostic test results will improve ?Outcome: Progressing ?  ?

## 2021-05-13 NOTE — Evaluation (Signed)
Physical Therapy Evaluation Patient Details Name: Katherine [name] MRN: [id number] DOB: 04/06/1956 Today's Date: 05/13/2021  History of Present Illness  65 yo female admitted 9/25 with syncope with Torsades de Pointes and bradycardia. PMhx: CAD, MI, CABG x3 (1995), A. fib on Eliquis and Tikosyn, HFpEF, HTN, HLD, PAD, ESRD, renal transplant in 1991, COPD on3L at home, depression, and T2DM  Clinical Impression  Pt able to perform hall ambulation on 3L with SPO2 95% and HR 74. Pt with encouragement to mobilize and able to demonstrated improved function from last admission. Pt with decreased strength, gait and activity tolerance who will benefit from acute therapy to maximize safety and independence for return home.        Recommendations for follow up therapy are one component of a multi-disciplinary discharge planning process, led by the attending physician.  Recommendations may be updated based on patient status, additional functional criteria and insurance authorization.  Follow Up Recommendations Home health PT;Supervision - Intermittent    Equipment Recommendations  None recommended by PT    Recommendations for Other Services       Precautions / Restrictions Precautions Precautions: Fall      Mobility  Bed Mobility Overal bed mobility: Modified Independent       Supine to sit: HOB elevated     General bed mobility comments: HOb  35elevated with increased time and use of rail to transition to sitting    Transfers Overall transfer level: Needs assistance   Transfers: Sit to/from Stand Sit to Stand: Min guard         General transfer comment: guarding for safety/lines to stand from bed, toilet with rail and to recliner, good hand placement  Ambulation/Gait Ambulation/Gait assistance: Min guard Gait Distance (Feet): 200 Feet Assistive device: Rolling walker (2 wheeled) Gait Pattern/deviations: Step-through pattern;Decreased stride length;Wide base of  support;Trunk flexed   Gait velocity interpretation: <1.8 ft/sec, indicate of risk for recurrent falls General Gait Details: pt with continual veering to right with pt able to repeatedly correct Rw position, flexed trunk with slow gait with cues for safety and direction  Stairs            Wheelchair Mobility    Modified Rankin (Stroke Patients Only)       Balance Overall balance assessment: Needs assistance;History of Falls   Sitting balance-Leahy Scale: Fair Sitting balance - Comments: pt able to sit without UE support   Standing balance support: Bilateral upper extremity supported Standing balance-Leahy Scale: Poor Standing balance comment: RW for static standing and gait                             Pertinent Vitals/Pain Pain Assessment: No/denies pain    Home Living Family/patient expects to be discharged to:: Private residence Living Arrangements: Other relatives Available Help at Discharge: Family;Available 24 hours/day Type of Home: House Home Access: Stairs to enter Entrance Stairs-Rails: Chemical engineer of Steps: 5 Home Layout: One level Home Equipment: Walker - 4 wheels;Cane - single point;Tub bench;Grab bars - toilet;Wheelchair - manual Additional Comments: wears 3L of supplemental O2 at home    Prior Function Level of Independence: Independent with assistive device(s)   Gait / Transfers Assistance Needed: walks with RW at baseline  ADL's / Homemaking Assistance Needed: reports independence with ADLs, driving  Comments: pt reports multiple falls     Hand Dominance        Extremity/Trunk Assessment   Upper Extremity Assessment Upper  Extremity Assessment: Generalized weakness    Lower Extremity Assessment Lower Extremity Assessment: Generalized weakness    Cervical / Trunk Assessment Cervical / Trunk Assessment: Kyphotic  Communication   Communication: No difficulties  Cognition Arousal/Alertness:  Awake/alert Behavior During Therapy: WFL for tasks assessed/performed Overall Cognitive Status: Impaired/Different from baseline Area of Impairment: Memory                     Memory: Decreased short-term memory         General Comments: oriented and following commands, veering right with gait      General Comments      Exercises     Assessment/Plan    PT Assessment Patient needs continued PT services  PT Problem List Decreased strength;Decreased mobility;Decreased range of motion;Decreased activity tolerance;Cardiopulmonary status limiting activity;Decreased balance;Decreased knowledge of use of DME       PT Treatment Interventions DME instruction;Therapeutic activities;Gait training;Therapeutic exercise;Patient/family education;Stair training;Balance training;Functional mobility training    PT Goals (Current goals can be found in the Care Plan section)  Acute Rehab PT Goals Patient Stated Goal: return home PT Goal Formulation: With patient Time For Goal Achievement: 05/27/21 Potential to Achieve Goals: Good    Frequency Min 3X/week   Barriers to discharge        Co-evaluation               AM-PAC PT "6 Clicks" Mobility  Outcome Measure Help needed turning from your back to your side while in a flat bed without using bedrails?: A Little Help needed moving from lying on your back to sitting on the side of a flat bed without using bedrails?: A Little Help needed moving to and from a bed to a chair (including a wheelchair)?: A Little Help needed standing up from a chair using your arms (e.g., wheelchair or bedside chair)?: A Little Help needed to walk in hospital room?: A Little Help needed climbing 3-5 steps with a railing? : A Lot 6 Click Score: 17    End of Session Equipment Utilized During Treatment: Gait belt Activity Tolerance: Patient tolerated treatment well Patient left: in chair;with call bell/phone within reach;with chair alarm  set Nurse Communication: Mobility status PT Visit Diagnosis: Other abnormalities of gait and mobility (R26.89);Repeated falls (R29.6);Muscle weakness (generalized) (M62.81)    Time: 2025-4270 PT Time Calculation (min) (ACUTE ONLY): 43 min   Charges:   PT Evaluation $PT Eval Moderate Complexity: 1 Mod PT Treatments $Gait Training: 8-22 mins $Therapeutic Activity: 8-22 mins        Alvaro Aungst P, PT Acute Rehabilitation Services Pager: [phone] Office: [place] 05/13/2021, 1:01 PM

## 2021-05-13 NOTE — Progress Notes (Addendum)
Patient observed pulling on Bipap face mask. This nurse asked the patient why she was pulling on the face mask. The patient stated that she could not breathe and requested that the face mask be removed. Bipap face mask removed and powered off. Nasal cannula 3 liters administered. Patient refusing to have the Bipap restarted at this time.

## 2021-05-14 DIAGNOSIS — I472 Ventricular tachycardia: Secondary | ICD-10-CM | POA: Diagnosis not present

## 2021-05-14 LAB — CBC
HCT: 27 % — ABNORMAL LOW (ref 36.0–46.0)
Hemoglobin: 8.5 g/dL — ABNORMAL LOW (ref 12.0–15.0)
MCH: 30.4 pg (ref 26.0–34.0)
MCHC: 31.5 g/dL (ref 30.0–36.0)
MCV: 96.4 fL (ref 80.0–100.0)
Platelets: 222 10*3/uL (ref 150–400)
RBC: 2.8 MIL/uL — ABNORMAL LOW (ref 3.87–5.11)
RDW: 18.5 % — ABNORMAL HIGH (ref 11.5–15.5)
WBC: 4.4 10*3/uL (ref 4.0–10.5)
nRBC: 0 % (ref 0.0–0.2)

## 2021-05-14 LAB — BASIC METABOLIC PANEL
Anion gap: 7 (ref 5–15)
BUN: 14 mg/dL (ref 8–23)
CO2: 21 mmol/L — ABNORMAL LOW (ref 22–32)
Calcium: 8.5 mg/dL — ABNORMAL LOW (ref 8.9–10.3)
Chloride: 104 mmol/L (ref 98–111)
Creatinine, Ser: 1.2 mg/dL — ABNORMAL HIGH (ref 0.44–1.00)
GFR, Estimated: 51 mL/min — ABNORMAL LOW (ref >60)
Glucose, Bld: 238 mg/dL — ABNORMAL HIGH (ref 70–99)
Potassium: 4.7 mmol/L (ref 3.5–5.1)
Sodium: 132 mmol/L — ABNORMAL LOW (ref 135–145)

## 2021-05-14 LAB — CALCIUM, IONIZED: Calcium, Ionized, Serum: 5.2 mg/dL (ref 4.5–5.6)

## 2021-05-14 LAB — GLUCOSE, CAPILLARY
Glucose-Capillary: 97 mg/dL (ref 70–99)
Glucose-Capillary: 155 mg/dL — ABNORMAL HIGH (ref 70–99)
Glucose-Capillary: 138 mg/dL — ABNORMAL HIGH (ref 70–99)
Glucose-Capillary: 275 mg/dL — ABNORMAL HIGH (ref 70–99)
Glucose-Capillary: 191 mg/dL — ABNORMAL HIGH (ref 70–99)

## 2021-05-14 LAB — MAGNESIUM: Magnesium: 2.1 mg/dL (ref 1.7–2.4)

## 2021-05-14 MED ORDER — GUAIFENESIN 100 MG/5ML PO SOLN
10.0000 mL | Freq: Four times a day (QID) | ORAL | Status: DC | PRN
  Administered 2021-05-14 – 2021-05-17 (×3): 200 mg via ORAL
  Filled 2021-05-14 (×5): qty 5
  Filled 2021-05-14: qty 10

## 2021-05-14 NOTE — Progress Notes (Signed)
PROGRESS NOTE    Katherine [name]  NTI:[id number] DOB: 03-Sep-1955 DOA: 05/11/2021 PCP: [name], MD    Brief Narrative:  65 year old female with history of coronary artery disease status post CABG, persistent A. fib on Eliquis and Tikosyn, heart failure with preserved ejection fraction, hypertension, hyperlipidemia, peripheral artery disease, renal transplant, COPD and chronic hypoxemic respiratory failure on 3 L oxygen at home, depression and type 2 diabetes presented to the emergency room with syncope and palpitations.  She was noted to have passed out with her brother at the bedside.  EMS was called and found patient with [name] and ventricular bigeminy rhythm.  Patient complained of chest pain and shortness of breath on arrival.  On arrival to hospital, EKG strip showed polymorphic [name]/torsade.  Cardiology consulted.  Treated with magnesium and dopamine for mild bradycardia and admitted to ICU.  Troponins were negative.  EKG with QTC of 565 and heart rate of 50/min.  Admitted to ICU and stabilized.  Treated with dopamine for bradycardia.   Assessment & Plan:   Principal Problem:   Torsades de pointes (HCC) Active Problems:   Paroxysmal atrial fibrillation ([place]); CHA2DS2VASc score F, HTN, CAD, CVA = 5   Type 2 diabetes mellitus with peripheral neuropathy (HCC)   Polymorphic ventricular tachycardia (HCC)   COPD (chronic obstructive pulmonary disease) (HCC)  Prolonged QT with torsades de pointes: At home on albuterol, Tikosyn, Cymbalta, Lamictal, sertraline, Lopressor and Phenergan.  Probably contributed by multiple medications. Treated with magnesium with improvement.  QT normalized. As per cardiology recommendation, keeping potassium more than 4, magnesium more than 2. Discontinued all QT prolonging medications.  Managed with Ativan for now.  Paroxysmal A. fib: Currently sinus rhythm.  Started back on beta-blockers.  Therapeutic on Eliquis.  Atypical chest pain: Acute  coronary syndrome ruled out.  AKI with history of ESRD status postrenal transplant: Stable.  On prednisone and azathioprine.  Electrolytes adequate.  COPD with chronic hypoxemic respiratory failure: On 3 L oxygen at home.  Continue Pulmicort and Brovana.  Duo nebs as needed.  Oxygen to keep saturations more than 90%.  CPAP at night.  Type 2 diabetes: On glimepiride and insulin at home.  Currently remains on insulin.  Will uptitrate once her appetite is improving.  History of depression and fibromyalgia: On multiple medications. Continue home doses of Ativan. Suggested to hold all medications including Cymbalta, Flexeril, Lamictal and Zoloft. Will request outpatient follow-up and gradually introducing antidepressants with Cymbalta.  Coronary artery disease: Stable.  Currently on Plavix along with Eliquis.  She is on a statin and beta-blockers.  Medically stabilizing.  Transfer to telemetry bed.  Continue to work with PT OT.   DVT prophylaxis:  apixaban (ELIQUIS) tablet 5 mg   Code Status: Full code Family Communication: None. Disposition Plan: Status is: Inpatient  Remains inpatient appropriate because:Inpatient level of care appropriate due to severity of illness  Dispo: The patient is from: Home              Anticipated d/c is to: Home              Patient currently is not medically stable to d/c.   Difficult to place patient No         Consultants:  Cardiology PCCM  Procedures:  None  Antimicrobials:  None   Subjective: Patient seen and examined.  She thinks she had a low-grade fever early morning so not feeling quite well otherwise no particular symptoms.  Denies any cough  congestion shortness of breath. Telemetry with normal sinus rhythm, QT 430.  Objective: Vitals:   05/14/21 0633 05/14/21 0700 05/14/21 0729 05/14/21 0800  BP: (!) 165/88 (!) 153/69  (!) 117/43  Pulse:   83 72  Resp: 18 (!) 22 20 (!) 21  Temp: 100.1 F (37.8 C)   99.3 F (37.4 C)   TempSrc: Axillary   Axillary  SpO2:   97% 91%  Weight:      Height:        Intake/Output Summary (Last 24 hours) at 05/14/2021 1042 Last data filed at 05/14/2021 0800 Gross per 24 hour  Intake 1320 ml  Output 1500 ml  Net -180 ml   Filed Weights   05/12/21 0004 05/12/21 0417 05/14/21 0500  Weight: 104.3 kg 96 kg 92.6 kg    Examination:  General exam: Appears calm and comfortable  Frail and debilitated.  Chronically sick looking.  Not in any distress. Respiratory system: Bilateral clear.  Currently on 3 L oxygen.  Uses CPAP at night. Cardiovascular system: S1 & S2 heard, RRR.  No edema.   Gastrointestinal system: Abdomen is nondistended, soft and nontender. No organomegaly or masses felt. Normal bowel sounds heard. Central nervous system: Alert and oriented. No focal neurological deficits. Extremities: Symmetric 5 x 5 power. Skin: No rashes, lesions or ulcers Psychiatry: Judgement and insight appear normal. Mood & affect appropriate.     Data Reviewed: I have personally reviewed following labs and imaging studies  CBC: Recent Labs  Lab 05/12/21 0022 05/12/21 0233 05/12/21 0706 05/13/21 0533 05/14/21 0034  WBC 3.9*  --  7.7 5.5 4.4  HGB 9.8* 10.2* 10.1* 8.8* 8.5*  HCT 31.7* 30.0* 31.7* 27.5* 27.0*  MCV 97.2  --  93.5 94.5 96.4  PLT 232  --  285 250 258   Basic Metabolic Panel: Recent Labs  Lab 05/12/21 0022 05/12/21 0233 05/12/21 0706 05/13/21 0533 05/13/21 1042 05/14/21 0034  NA 133* 135 130* 139 134* 132*  K 4.4 3.8 4.0 3.1* 4.2 4.7  CL 103  --  100 106 102 104  CO2 21*  --  20* 24 21* 21*  GLUCOSE 264*  --  273* 92 177* 238*  BUN 14  --  17 13 14 14   CREATININE 1.29*  --  1.44* 1.13* 1.16* 1.20*  CALCIUM 8.7*  --  9.2 8.8* 8.4* 8.5*  MG 1.8  --  3.4* 2.4  --  2.1  PHOS  --   --  2.8  --   --   --    GFR: Estimated Creatinine Clearance: 49.8 mL/min (A) (by C-G formula based on SCr of 1.2 mg/dL (H)). Liver Function Tests: Recent Labs  Lab  05/12/21 0706 05/13/21 0533  AST 24 14*  ALT 8 10  ALKPHOS 121 94  BILITOT 1.0 0.6  PROT 7.0 5.9*  ALBUMIN 2.5* 2.1*   No results for input(s): LIPASE, AMYLASE in the last 168 hours. Recent Labs  Lab 05/12/21 1516  AMMONIA 22   Coagulation Profile: No results for input(s): INR, PROTIME in the last 168 hours. Cardiac Enzymes: No results for input(s): CKTOTAL, CKMB, CKMBINDEX, TROPONINI in the last 168 hours. BNP (last 3 results) No results for input(s): PROBNP in the last 8760 hours. HbA1C: Recent Labs    05/12/21 0700  HGBA1C 6.9*   CBG: Recent Labs  Lab 05/13/21 1122 05/13/21 1633 05/13/21 2220 05/14/21 0634 05/14/21 0752  GLUCAP 200* 227* 200* 97 155*   Lipid Profile: No results for input(s):  CHOL, HDL, LDLCALC, TRIG, CHOLHDL, LDLDIRECT in the last 72 hours. Thyroid Function Tests: No results for input(s): TSH, T4TOTAL, FREET4, T3FREE, THYROIDAB in the last 72 hours. Anemia Panel: No results for input(s): VITAMINB12, FOLATE, FERRITIN, TIBC, IRON, RETICCTPCT in the last 72 hours. Sepsis Labs: No results for input(s): PROCALCITON, LATICACIDVEN in the last 168 hours.  Recent Results (from the past 240 hour(s))  Resp Panel by RT-PCR (Flu A&B, Covid) Nasopharyngeal Swab     Status: None   Collection Time: 05/12/21 12:22 AM   Specimen: Nasopharyngeal Swab; Nasopharyngeal(NP) swabs in vial transport medium  Result Value Ref Range Status   SARS Coronavirus 2 by RT PCR NEGATIVE NEGATIVE Final    Comment: (NOTE) SARS-CoV-2 target nucleic acids are NOT DETECTED.  The SARS-CoV-2 RNA is generally detectable in upper respiratory specimens during the acute phase of infection. The lowest concentration of SARS-CoV-2 viral copies this assay can detect is 138 copies/mL. A negative result does not preclude SARS-Cov-2 infection and should not be used as the sole basis for treatment or other patient management decisions. A negative result may occur with  improper specimen  collection/handling, submission of specimen other than nasopharyngeal swab, presence of viral mutation(s) within the areas targeted by this assay, and inadequate number of viral copies(<138 copies/mL). A negative result must be combined with clinical observations, patient history, and epidemiological information. The expected result is Negative.  Fact Sheet for Patients:  EntrepreneurPulse.com.au  Fact Sheet for Healthcare Providers:  IncredibleEmployment.be  This test is no t yet approved or cleared by the [place] FDA and  has been authorized for detection and/or diagnosis of SARS-CoV-2 by FDA under an Emergency Use Authorization (EUA). This EUA will remain  in effect (meaning this test can be used) for the duration of the COVID-19 declaration under Section 564(b)(1) of the Act, 21 U.S.C.section 360bbb-3(b)(1), unless the authorization is terminated  or revoked sooner.       Influenza A by PCR NEGATIVE NEGATIVE Final   Influenza B by PCR NEGATIVE NEGATIVE Final    Comment: (NOTE) The Xpert Xpress SARS-CoV-2/FLU/RSV plus assay is intended as an aid in the diagnosis of influenza from Nasopharyngeal swab specimens and should not be used as a sole basis for treatment. Nasal washings and aspirates are unacceptable for Xpert Xpress SARS-CoV-2/FLU/RSV testing.  Fact Sheet for Patients: EntrepreneurPulse.com.au  Fact Sheet for Healthcare Providers: IncredibleEmployment.be  This test is not yet approved or cleared by the [place] FDA and has been authorized for detection and/or diagnosis of SARS-CoV-2 by FDA under an Emergency Use Authorization (EUA). This EUA will remain in effect (meaning this test can be used) for the duration of the COVID-19 declaration under Section 564(b)(1) of the Act, 21 U.S.C. section 360bbb-3(b)(1), unless the authorization is terminated or revoked.  Performed at [facility], [place] [address], [place], [place] [address]   MRSA Next Gen by PCR, Nasal     Status: None   Collection Time: 05/12/21  3:53 AM   Specimen: Nasal Mucosa; Nasal Swab  Result Value Ref Range Status   MRSA by PCR Next Gen NOT DETECTED NOT DETECTED Final    Comment: (NOTE) The GeneXpert MRSA Assay (FDA approved for NASAL specimens only), is one component of a comprehensive MRSA colonization surveillance program. It is not intended to diagnose MRSA infection nor to guide or monitor treatment for MRSA infections. Test performance is not FDA approved in patients less than 75 years old. Performed at [facility], [address] [place], [place] [address]          Radiology Studies: No results found.      Scheduled Meds:  apixaban  5 mg Oral BID   arformoterol  15 mcg Nebulization BID   azaTHIOprine  125 mg Oral Daily   budesonide  2 mL Nebulization BID   calcitRIOL  0.25 mcg Oral Q3 days   Chlorhexidine Gluconate Cloth  6 each Topical Daily   clopidogrel  75 mg Oral Daily   famotidine  20 mg Oral Daily   insulin aspart  0-6 Units Subcutaneous TID PC & HS   isosorbide mononitrate  60 mg Oral Daily   loratadine  10 mg Oral Daily   LORazepam  1 mg Oral BID   magnesium oxide  400 mg Oral Daily   metoprolol tartrate  100 mg Oral BID   montelukast  10 mg Oral QHS    morphine injection  1 mg Intravenous Once   predniSONE  5 mg Oral q morning   rosuvastatin  20 mg Oral Daily   Continuous Infusions:   LOS: 2 days    Time spent: 35 minutes     Barb Merino, MD [facility] Pager [phone]

## 2021-05-14 NOTE — Progress Notes (Signed)
Progress Note  Patient Name: Katherine [name] Date of Encounter: 05/14/2021  Primary Cardiologist:   [name], MD   Subjective   Ambulated yesterday.  On chronic 3 liters O2.    Inpatient Medications    Scheduled Meds:  apixaban  5 mg Oral BID   arformoterol  15 mcg Nebulization BID   azaTHIOprine  125 mg Oral Daily   budesonide  2 mL Nebulization BID   calcitRIOL  0.25 mcg Oral Q3 days   Chlorhexidine Gluconate Cloth  6 each Topical Daily   clopidogrel  75 mg Oral Daily   famotidine  20 mg Oral Daily   insulin aspart  0-6 Units Subcutaneous TID PC & HS   isosorbide mononitrate  60 mg Oral Daily   loratadine  10 mg Oral Daily   LORazepam  1 mg Oral BID   magnesium oxide  400 mg Oral Daily   metoprolol tartrate  100 mg Oral BID   montelukast  10 mg Oral QHS    morphine injection  1 mg Intravenous Once   predniSONE  5 mg Oral q morning   rosuvastatin  20 mg Oral Daily   Continuous Infusions:  promethazine (PHENERGAN) injection (IM or IVPB)     PRN Meds: acetaminophen, docusate sodium, ipratropium-albuterol, nitroGLYCERIN, oxyCODONE, polyethylene glycol   Vital Signs    Vitals:   05/14/21 0600 05/14/21 0633 05/14/21 0700 05/14/21 0729  BP:  (!) 165/88 (!) 153/69   Pulse:    83  Resp: (!) 25 18 (!) 22 20  Temp:  100.1 F (37.8 C)    TempSrc:  Axillary    SpO2:    97%  Weight:      Height:        Intake/Output Summary (Last 24 hours) at 05/14/2021 0737 Last data filed at 05/14/2021 0600 Gross per 24 hour  Intake 1320 ml  Output 2100 ml  Net -780 ml   Filed Weights   05/12/21 0004 05/12/21 0417 05/14/21 0500  Weight: 104.3 kg 96 kg 92.6 kg    Telemetry    NSR - Personally Reviewed  ECG    NSR, rate 64, axis WNL, intervals WNL, no acute ST T wave changes.   - Personally Reviewed  Physical Exam   GEN: No  acute distress.   Neck: No  JVD Cardiac: RRR, no murmurs, rubs, or gallops.  Respiratory: Clear  to auscultation bilaterally. GI:  Soft, nontender, non-distended, normal bowel sounds  MS:  No edema; No deformity. Neuro:   Nonfocal  Psych: Oriented and appropriate     Labs    Chemistry Recent Labs  Lab 05/12/21 0706 05/13/21 0533 05/13/21 1042 05/14/21 0034  NA 130* 139 134* 132*  K 4.0 3.1* 4.2 4.7  CL 100 106 102 104  CO2 20* 24 21* 21*  GLUCOSE 273* 92 177* 238*  BUN 17 13 14 14   CREATININE 1.44* 1.13* 1.16* 1.20*  CALCIUM 9.2 8.8* 8.4* 8.5*  PROT 7.0 5.9*  --   --   ALBUMIN 2.5* 2.1*  --   --   AST 24 14*  --   --   ALT 8 10  --   --   ALKPHOS 121 94  --   --   BILITOT 1.0 0.6  --   --   GFRNONAA 41* 55* 53* 51*  ANIONGAP 10 9 11 7      Hematology Recent Labs  Lab 05/12/21 0706 05/13/21 0533 05/14/21 0034  WBC 7.7 5.5 4.4  RBC 3.39* 2.91* 2.80*  HGB 10.1* 8.8* 8.5*  HCT 31.7* 27.5* 27.0*  MCV 93.5 94.5 96.4  MCH 29.8 30.2 30.4  MCHC 31.9 32.0 31.5  RDW 18.2* 18.4* 18.5*  PLT 285 250 222    Cardiac EnzymesNo results for input(s): TROPONINI in the last 168 hours. No results for input(s): TROPIPOC in the last 168 hours.   BNPNo results for input(s): BNP, PROBNP in the last 168 hours.   DDimer No results for input(s): DDIMER in the last 168 hours.   Radiology    No results found.  Cardiac Studies   Cath/PCI: 02/20/21   Mid RCA to Dist RCA lesion is 100% stenosed. Ost LAD to Prox LAD lesion is 50% stenosed. Prox LAD-1 lesion is 100% stenosed. Prox LAD-2 lesion is 100% stenosed. Ost Cx to Prox Cx lesion is 60% stenosed -focal eccentric heavily calcified lesion -> negative RFR (0.96) 1st Mrg lesion is 40% stenosed. 2nd Mrg lesion is 80% stenosed - focal SVG-RCA graft was visualized by angiography and is large. The graft exhibits minimal luminal irregularities. SCG-RCA: Mid Graft to Dist Graft lesion is 40% stenosed. LIMA graft was not visualized due to inability to cannulate. ------CULPRIT LESIONS - SVG-DIAG ISR & Native Diag 95% ------- SVG-Diag: Prox Graft to Mid Graft Stent  is 10% stenosed. SVG-Diag: Dist Graft to Insertion Stent is 90% stenosed. Scoring balloon angioplasty was performed using a BALLOON SCOREFLEX 2.50X10. Followed by 2.75 mm [place] balloon post dilation Post intervention, there is a 25% residual stenosis. Ost 1st Diag Stent (from Graft into native) is 80% re-stenosed. Native 1st Diag (immediately after the sten) is 95% stenosed. A drug-eluting stent was successfully placed overlapping from the distal stent edge crossing the lesion, using a STENT ONYX FRONTIER 2.25X12. -> Postdilated at the overlap segment to 2.6 mm, and distally to 2.4 mm -------------------------------------------- Post intervention, there is a 15% residual stenosis at the 80% site, and 0% residual stenosis at the 95% site.. LV end diastolic pressure is moderate-severely elevated.  Patient Profile     65 y.o. 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 She apparently called EMS due to recurrent syncope with WCT captured on pre-hospital rhythm strip. Cardiology consulted for further evaluation.   Assessment & Plan    WCT:   Maintaining NSR.    Prolonged QT with TdP.   Likely med related.   QTc is normal now.  Not planning to restart Tikosyn.  Likely could be back on previous meds otherwise if electrolytes and QT followed.     Cymbalta might be the most appropriate antidepressant from a QTc standpoint if thought to be appropriate per her PCP or psychiatry and with close follow up.    ATRIAL FIB:    Continues Eliquis.     Started beta blocker.  Continue today    CHEST PAIN:      Not complaining of chest pain today.  Continue medical management.    AKI:   Creat of 1.2 is unchanged.    Anemia:  Follow  Hgb which has dropped but unchanged from yesterday.       For questions or updates, please contact [place] Please consult [email or web address] for contact info under  Cardiology/STEMI.   Signed, Minus Breeding, MD  05/14/2021, 7:37 AM

## 2021-05-14 NOTE — Progress Notes (Signed)
Pt resting comfortably on CPAP with 3L O2 bleed in. Rt will cont to monitor.

## 2021-05-14 NOTE — TOC Initial Note (Signed)
Transition of Care [facility]) - Initial/Assessment Note    Patient Details  Name: Katherine [name] MRN: [id number] Date of Birth: [birth year]  Transition of Care [facility]) CM/SW Contact:    Bethena Roys, RN Phone Number: 05/14/2021, 1:22 PM  Clinical Narrative: Risk for readmission assessment completed. Case Manager attempted several times to speak with the patient. Case Manager then spoke with the niece [name] [facility]) regarding home care needs. Per [name], the patient lives with her brother and has durable medical equipment cane, rolling walker, and transport chair. [name] states that the patient is active with [place]. Per [name], the patient has transportation to appointments; however, the patient cancels appointments often. [name] also states that the patient does not return phone calls to the [place] as well or she may refuse allowing them in the home (patient has a dog). Case Manager reached out to [facility] and discussed adding a Equities trader for disease management due to risk for readmission, Physical Therapy, and a Clinical Social Worker-patient would benefit from an [place] (ALF). Niece also discussed that the patient has oxygen via [employer]. The patient is using a portable oxygen concentrator at her brothers house. Patient has a larger oxygen concentrator at her address and would like it to be moved to the brothers location- Case Manager reached out to [employer] to make them aware to call the patient. Case Manager contacted [facility] liaison to see if [facility] can assist with community resources. Case Manager will continue to follow for additional transition of care needs.   Expected Discharge Plan: [place] Barriers to Discharge: No Barriers Identified   Patient Goals and CMS Choice Patient states their goals for this hospitalization and ongoing recovery are:: to return to her brothers home.   Choice offered to / list presented to : Patient, [facility] POA / Guardian  (Niece wants the patient to use [facility] again for services.)  Expected Discharge Plan and Services Expected Discharge Plan: [place] In-house Referral: NA Discharge Planning Services: CM Consult Post Acute Care Choice: Home Health, Resumption of Svcs/PTA Provider Living arrangements for the past 2 months: Single Family Home                   DME Agency: NA       HH Arranged: RN, Disease Management, PT, Social Work [employer] Agency: [place] Date [place]: 05/14/21 Time HH Agency Contacted: 1300 Representative spoke with at [place]: Tommi Rumps  Prior Living Arrangements/Services Living arrangements for the past 2 months: [place] with:: Self, Siblings (The patient lives with her brother) Patient language and need for interpreter reviewed:: Yes Do you feel safe going back to the place where you live?: Yes      Need for Family Participation in Patient Care: Yes (Comment) Care giver support system in place?: Yes (comment) Current home services: DME (Patient has cane, rolling walker, and transport chair.) Criminal Activity/Legal Involvement Pertinent to Current Situation/Hospitalization: No - Comment as needed  Activities of Daily Living Home Assistive Devices/Equipment: CPAP ADL Screening (condition at time of admission) Patient's cognitive ability adequate to safely complete daily activities?: Yes Is the patient deaf or have difficulty hearing?: No Does the patient have difficulty seeing, even when wearing glasses/contacts?: No Does the patient have difficulty concentrating, remembering, or making decisions?: Yes Patient able to express need for assistance with ADLs?: Yes Does the patient have difficulty dressing or bathing?: Yes Independently performs ADLs?: No Communication:  Independent Dressing (OT): Needs assistance Is this a change from baseline?: Change from baseline, expected to last <3days Grooming: Needs assistance Is  this a change from baseline?: Change from baseline, expected to last <3 days Feeding: Independent Bathing: Needs assistance Is this a change from baseline?: Change from baseline, expected to last <3 days Toileting: Needs assistance Is this a change from baseline?: Change from baseline, expected to last <3 days In/Out Bed: Needs assistance Is this a change from baseline?: Change from baseline, expected to last <3 days Walks in Home: Independent Does the patient have difficulty walking or climbing stairs?: Yes Weakness of Legs: Both Weakness of Arms/Hands: None  Permission Sought/Granted Permission sought to share information with : Case Manager, Customer service manager, Family Supports Permission granted to share information with : Yes, Verbal Permission Granted     Permission granted to share info w AGENCY: [facility]        Emotional Assessment Appearance:: Appears stated age Attitude/Demeanor/Rapport: Engaged   Orientation: : Oriented to Self, Oriented to Place, Oriented to  Time, Oriented to Situation Alcohol / Substance Use: Not Applicable Psych Involvement: No (comment)  Admission diagnosis:  [place] [I47.2] Polymorphic ventricular tachycardia [I47.2] Patient Active Problem List   Diagnosis Date Noted   Torsades de pointes ([place]) 05/12/2021   Polymorphic ventricular tachycardia (HCC)    Bradycardia    Acute encephalopathy    COPD (chronic obstructive pulmonary disease) (HCC)    AKI (acute kidney injury) ([place])    History of renal transplant    Type 2 diabetes mellitus with hyperglycemia (HCC)    Nausea & vomiting 04/19/2021   Acute kidney injury ([place]) 04/19/2021   Viral syndrome 03/21/2021   Pure hypercholesterolemia    Coronary stent restenosis due to scar tissue    Unstable angina ([place]) 02/19/2021   Acute on chronic diastolic CHF (congestive heart failure) ([place]) 01/29/2021   Acute CHF (congestive heart failure) ([place]) 01/29/2021   Acute on chronic  systolic CHF (congestive heart failure) ([place]) 01/29/2021   Non-ST elevation (NSTEMI) myocardial infarction [facility])    Chronic respiratory failure with hypoxia ([place]) 01/01/2021   Flu vaccine need 08/13/2020   Need for pneumococcal vaccination 08/13/2020   Visit for screening mammogram 08/13/2020   Candidal skin infection 08/13/2020   GAD (generalized anxiety disorder) 08/13/2020   Chronic bronchitis, mucopurulent ([place]) 08/24/2019   Current chronic use of systemic steroids 07/23/2019   OSA (obstructive sleep apnea)    Severe episode of recurrent major depressive disorder, with psychotic features ([place]) 02/16/2019   Obstructive chronic bronchitis without exacerbation ([place]) 11/03/2018   Long term (current) use of anticoagulants 04/28/2018   Renal transplant, status post 01/28/2018   LBBB (left bundle branch block) 10/07/2017   Primary osteoarthritis of right knee 08/18/2017   Shortness of breath 06/18/2017   Mild aortic stenosis by prior echocardiogram 01/28/2017   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   Deficiency anemia 10/14/2016   Normocytic anemia 10/13/2016   Fibromyalgia 03/30/2016   Other spondylosis with radiculopathy, lumbar region 03/30/2016   Type 2 diabetes mellitus with peripheral neuropathy ([place]) 03/30/2016   Paroxysmal atrial fibrillation ([place]); CHA2DS2VASc score F, HTN, CAD, CVA = 5 06/28/2014   Hypertension associated with diabetes ([place])    Chest pain 08/23/2013   PAD (peripheral artery disease) ([place]) 08/17/2013   Stenosis of right carotid artery without infarction 10/08/2012   Dyslipidemia, goal LDL below 70 10/08/2012    Class: Diagnosis of   Mitral annular calcification 10/08/2012   Hypomagnesemia 06/13/2012  Renal transplant disorder 06/12/2012   Chronic allergic rhinitis 04/29/2011   Extrinsic asthma 09/09/2007   GERD 09/09/2007   COUGH, CHRONIC 09/09/2007   Morbid obesity - s/p Lap Band 9/'05 05/07/2004    Class: Diagnosis of   Hx of CABG 09/07/1993    Class: History  of   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 07/1993   CAD in native artery 07/1993   ESRD (end stage renal disease) ([place]) 1991   PCP:  [name], MD Pharmacy:   [id number] - [place], [address] [address] Phone: [phone] Fax: [phone]   Readmission Risk Interventions Readmission Risk Prevention Plan 05/14/2021 02/21/2021 01/31/2021  Transportation Screening Complete Complete Complete  HRI or Home Care Consult - - Complete  Social Work Consult for [place] Planning/Counseling - - Complete  Palliative Care Screening - - Not Applicable  Medication Review Press photographer) Complete Complete Complete  PCP or Specialist appointment within 3-5 days of discharge Complete Complete -  [place] or Home Care Consult Complete Complete -  SW Recovery Care/Counseling Consult Complete Complete -  Palliative Care Screening Not Applicable Not Applicable -  [place] Not Applicable Not Applicable -  Some recent data might be hidden

## 2021-05-15 ENCOUNTER — Inpatient Hospital Stay (HOSPITAL_COMMUNITY): Payer: Medicare Other

## 2021-05-15 DIAGNOSIS — I472 Ventricular tachycardia: Secondary | ICD-10-CM | POA: Diagnosis not present

## 2021-05-15 LAB — URINALYSIS, COMPLETE (UACMP) WITH MICROSCOPIC
Bilirubin Urine: NEGATIVE
Glucose, UA: 50 mg/dL — AB
Ketones, ur: NEGATIVE mg/dL
Nitrite: NEGATIVE
Protein, ur: NEGATIVE mg/dL
Specific Gravity, Urine: 1.004 — ABNORMAL LOW (ref 1.005–1.030)
pH: 7 (ref 5.0–8.0)

## 2021-05-15 LAB — GLUCOSE, CAPILLARY
Glucose-Capillary: 121 mg/dL — ABNORMAL HIGH (ref 70–99)
Glucose-Capillary: 228 mg/dL — ABNORMAL HIGH (ref 70–99)
Glucose-Capillary: 228 mg/dL — ABNORMAL HIGH (ref 70–99)
Glucose-Capillary: 254 mg/dL — ABNORMAL HIGH (ref 70–99)
Glucose-Capillary: 151 mg/dL — ABNORMAL HIGH (ref 70–99)

## 2021-05-15 MED ORDER — AMLODIPINE BESYLATE 2.5 MG PO TABS
2.5000 mg | ORAL_TABLET | Freq: Every day | ORAL | Status: DC
  Administered 2021-05-15 – 2021-05-18 (×4): 2.5 mg via ORAL
  Filled 2021-05-15 (×4): qty 1

## 2021-05-15 MED ORDER — INSULIN ASPART 100 UNIT/ML IJ SOLN
0.0000 [IU] | Freq: Three times a day (TID) | INTRAMUSCULAR | Status: DC
  Administered 2021-05-15: 8 [IU] via SUBCUTANEOUS
  Administered 2021-05-15: 5 [IU] via SUBCUTANEOUS
  Administered 2021-05-16: 11 [IU] via SUBCUTANEOUS
  Administered 2021-05-16: 3 [IU] via SUBCUTANEOUS
  Administered 2021-05-17: 15 [IU] via SUBCUTANEOUS
  Administered 2021-05-17 (×2): 3 [IU] via SUBCUTANEOUS
  Administered 2021-05-18: 8 [IU] via SUBCUTANEOUS
  Administered 2021-05-18: 3 [IU] via SUBCUTANEOUS

## 2021-05-15 MED ORDER — SODIUM CHLORIDE 0.9 % IV SOLN
1.0000 g | INTRAVENOUS | Status: DC
  Administered 2021-05-15 – 2021-05-17 (×3): 1 g via INTRAVENOUS
  Filled 2021-05-15 (×5): qty 10

## 2021-05-15 MED ORDER — LAMOTRIGINE 25 MG PO TABS
25.0000 mg | ORAL_TABLET | Freq: Every day | ORAL | Status: DC
  Administered 2021-05-15 – 2021-05-17 (×3): 25 mg via ORAL
  Filled 2021-05-15 (×5): qty 1

## 2021-05-15 MED ORDER — INSULIN ASPART 100 UNIT/ML IJ SOLN
0.0000 [IU] | Freq: Every day | INTRAMUSCULAR | Status: DC
Start: 2021-05-15 — End: 2021-05-18
  Administered 2021-05-17: 3 [IU] via SUBCUTANEOUS

## 2021-05-15 MED ORDER — DULOXETINE HCL 60 MG PO CPEP
60.0000 mg | ORAL_CAPSULE | Freq: Every day | ORAL | Status: DC
  Administered 2021-05-15 – 2021-05-18 (×4): 60 mg via ORAL
  Filled 2021-05-15 (×4): qty 1

## 2021-05-15 NOTE — Progress Notes (Signed)
PT Cancellation Note  Patient Details Name: Willisha Sligar MRN: [id number] DOB: [birth year]   Cancelled Treatment:    Reason Eval/Treat Not Completed: Patient declined, no reason specified. Pt declined due to reports of fatigue this morning, states no pain but asking therapist to return at later time. Will continue to follow and re-attempt as time/schedule allow.   [place], PT, DPT   Acute Rehabilitation Department Pager #: [phone]   Sandra Cockayne 05/15/2021, 11:26 AM

## 2021-05-15 NOTE — Progress Notes (Signed)
PROGRESS NOTE    Katherine [name]  NOI:[id number] DOB: 1955/09/22 DOA: 05/11/2021 PCP: [name], MD    Brief Narrative:  65 year old female with history of coronary artery disease status post CABG, persistent A. fib on Eliquis and Tikosyn, heart failure with preserved ejection fraction, hypertension, hyperlipidemia, peripheral artery disease, renal transplant, COPD and chronic hypoxemic respiratory failure on 3 L oxygen at home, depression and type 2 diabetes presented to the emergency room with syncope and palpitations.  She was noted to have passed out with her brother at the bedside.  EMS was called and found patient with [name] and ventricular bigeminy rhythm.  Patient complained of chest pain and shortness of breath on arrival.  On arrival to hospital, EKG strip showed polymorphic [name]/torsade.  Cardiology consulted.  Treated with magnesium and dopamine for mild bradycardia and admitted to ICU.  Troponins were negative.  EKG with QTC of 565 and heart rate of 50/min.  Admitted to ICU and stabilized.  Treated with dopamine for bradycardia.   Assessment & Plan:   Principal Problem:   Torsades de pointes (HCC) Active Problems:   Paroxysmal atrial fibrillation ([place]); CHA2DS2VASc score F, HTN, CAD, CVA = 5   Type 2 diabetes mellitus with peripheral neuropathy (HCC)   Polymorphic ventricular tachycardia (HCC)   COPD (chronic obstructive pulmonary disease) (HCC)  Prolonged QT with torsades de pointes: At home on albuterol, Tikosyn, Cymbalta, Lamictal, sertraline, Lopressor and Phenergan.  Probably contributed by multiple medications. Treated with magnesium with improvement.  QT normalized. As per cardiology recommendation, keeping potassium more than 4, magnesium more than 2. Discontinued all QT prolonging medications.  Managed with Ativan for now. Resume patient's Cymbalta and Lamictal as she is already developing labile emotions and crying.  Paroxysmal A. fib: Currently  sinus rhythm.  Started back on beta-blockers.  Therapeutic on Eliquis.  Atypical chest pain: Acute coronary syndrome ruled out.  AKI with history of ESRD status postrenal transplant: Stable.  On prednisone and azathioprine.  Electrolytes adequate.  COPD with chronic hypoxemic respiratory failure: On 3 L oxygen at home.  Continue Pulmicort and Brovana.  Duo nebs as needed.  Oxygen to keep saturations more than 90%.  CPAP at night.  Type 2 diabetes: On glimepiride and insulin at home.  Currently remains on insulin.  Will uptitrate once her appetite is improving.  History of depression and fibromyalgia: On multiple medications. Continue home doses of Ativan. QTC improved.  Tikosyn discontinued. Will start patient on Cymbalta and Lamictal today with minimum or no effect on QTC.  Coronary artery disease: Stable.  Currently on Plavix along with Eliquis.  She is on a statin and beta-blockers.  Febrile episode: Patient continues to have low-grade fever.  Chest x-ray today is essentially normal.  We will check urinalysis and culture.  Recent E. coli UTI.  If abnormal, will treat with cephalosporins and send for urine cultures.  Draw blood cultures.   DVT prophylaxis:  apixaban (ELIQUIS) tablet 5 mg   Code Status: Full code Family Communication: None. Disposition Plan: Status is: Inpatient  Remains inpatient appropriate because:Inpatient level of care appropriate due to severity of illness  Dispo: The patient is from: Home              Anticipated d/c is to: Home with home health therapies.              Patient currently is not medically stable to d/c.   Difficult to place patient No  Consultants:  Cardiology PCCM  Procedures:  None  Antimicrobials:  None   Subjective: Patient seen and examined.  Low-grade fever overnight.  She is crying and emotional because it hurts everywhere.  She is crying because she has a low-grade fever.  Telemetry with sinus rhythm.  She had  1 episode of NSVT otherwise mostly sinus.  Objective: Vitals:   05/15/21 1000 05/15/21 1100 05/15/21 1132 05/15/21 1200  BP: (!) 124/92 (!) 116/55  101/87  Pulse: 68 65  66  Resp: (!) 21 18  16   Temp:   98.8 F (37.1 C)   TempSrc:   Axillary   SpO2:  93%    Weight:      Height:        Intake/Output Summary (Last 24 hours) at 05/15/2021 1320 Last data filed at 05/15/2021 1200 Gross per 24 hour  Intake 1560 ml  Output 1125 ml  Net 435 ml   Filed Weights   05/12/21 0004 05/12/21 0417 05/14/21 0500  Weight: 104.3 kg 96 kg 92.6 kg    Examination:  General exam: Appears calm and comfortable  Frail and debilitated.  Chronically sick looking.  Not in any distress. Patient is anxious and crying. Respiratory system: Bilateral clear.  Currently on 3 L oxygen.  Uses CPAP at night. Cardiovascular system: S1 & S2 heard, RRR.  No edema.   Gastrointestinal system: Abdomen is nondistended, soft and nontender. No organomegaly or masses felt. Normal bowel sounds heard. Central nervous system: Alert and oriented. No focal neurological deficits. Extremities: Symmetric 5 x 5 power.    Data Reviewed: I have personally reviewed following labs and imaging studies  CBC: Recent Labs  Lab 05/12/21 0022 05/12/21 0233 05/12/21 0706 05/13/21 0533 05/14/21 0034  WBC 3.9*  --  7.7 5.5 4.4  HGB 9.8* 10.2* 10.1* 8.8* 8.5*  HCT 31.7* 30.0* 31.7* 27.5* 27.0*  MCV 97.2  --  93.5 94.5 96.4  PLT 232  --  285 250 852   Basic Metabolic Panel: Recent Labs  Lab 05/12/21 0022 05/12/21 0233 05/12/21 0706 05/13/21 0533 05/13/21 1042 05/14/21 0034  NA 133* 135 130* 139 134* 132*  K 4.4 3.8 4.0 3.1* 4.2 4.7  CL 103  --  100 106 102 104  CO2 21*  --  20* 24 21* 21*  GLUCOSE 264*  --  273* 92 177* 238*  BUN 14  --  17 13 14 14   CREATININE 1.29*  --  1.44* 1.13* 1.16* 1.20*  CALCIUM 8.7*  --  9.2 8.8* 8.4* 8.5*  MG 1.8  --  3.4* 2.4  --  2.1  PHOS  --   --  2.8  --   --   --    GFR: Estimated  Creatinine Clearance: 49.8 mL/min (A) (by C-G formula based on SCr of 1.2 mg/dL (H)). Liver Function Tests: Recent Labs  Lab 05/12/21 0706 05/13/21 0533  AST 24 14*  ALT 8 10  ALKPHOS 121 94  BILITOT 1.0 0.6  PROT 7.0 5.9*  ALBUMIN 2.5* 2.1*   No results for input(s): LIPASE, AMYLASE in the last 168 hours. Recent Labs  Lab 05/12/21 1516  AMMONIA 22   Coagulation Profile: No results for input(s): INR, PROTIME in the last 168 hours. Cardiac Enzymes: No results for input(s): CKTOTAL, CKMB, CKMBINDEX, TROPONINI in the last 168 hours. BNP (last 3 results) No results for input(s): PROBNP in the last 8760 hours. HbA1C: No results for input(s): HGBA1C in the last 72 hours.  CBG:  Recent Labs  Lab 05/14/21 1125 05/14/21 1530 05/14/21 2234 05/15/21 0652 05/15/21 1133  GLUCAP 138* 275* 191* 121* 228*   Lipid Profile: No results for input(s): CHOL, HDL, LDLCALC, TRIG, CHOLHDL, LDLDIRECT in the last 72 hours. Thyroid Function Tests: No results for input(s): TSH, T4TOTAL, FREET4, T3FREE, THYROIDAB in the last 72 hours. Anemia Panel: No results for input(s): VITAMINB12, FOLATE, FERRITIN, TIBC, IRON, RETICCTPCT in the last 72 hours. Sepsis Labs: No results for input(s): PROCALCITON, LATICACIDVEN in the last 168 hours.  Recent Results (from the past 240 hour(s))  Resp Panel by RT-PCR (Flu A&B, Covid) Nasopharyngeal Swab     Status: None   Collection Time: 05/12/21 12:22 AM   Specimen: Nasopharyngeal Swab; Nasopharyngeal(NP) swabs in vial transport medium  Result Value Ref Range Status   SARS Coronavirus 2 by RT PCR NEGATIVE NEGATIVE Final    Comment: (NOTE) SARS-CoV-2 target nucleic acids are NOT DETECTED.  The SARS-CoV-2 RNA is generally detectable in upper respiratory specimens during the acute phase of infection. The lowest concentration of SARS-CoV-2 viral copies this assay can detect is 138 copies/mL. A negative result does not preclude SARS-Cov-2 infection and should  not be used as the sole basis for treatment or other patient management decisions. A negative result may occur with  improper specimen collection/handling, submission of specimen other than nasopharyngeal swab, presence of viral mutation(s) within the areas targeted by this assay, and inadequate number of viral copies(<138 copies/mL). A negative result must be combined with clinical observations, patient history, and epidemiological information. The expected result is Negative.  Fact Sheet for Patients:  EntrepreneurPulse.com.au  Fact Sheet for Healthcare Providers:  IncredibleEmployment.be  This test is no t yet approved or cleared by the [place] FDA and  has been authorized for detection and/or diagnosis of SARS-CoV-2 by FDA under an Emergency Use Authorization (EUA). This EUA will remain  in effect (meaning this test can be used) for the duration of the COVID-19 declaration under Section 564(b)(1) of the Act, 21 U.S.C.section 360bbb-3(b)(1), unless the authorization is terminated  or revoked sooner.       Influenza A by PCR NEGATIVE NEGATIVE Final   Influenza B by PCR NEGATIVE NEGATIVE Final    Comment: (NOTE) The Xpert Xpress SARS-CoV-2/FLU/RSV plus assay is intended as an aid in the diagnosis of influenza from Nasopharyngeal swab specimens and should not be used as a sole basis for treatment. Nasal washings and aspirates are unacceptable for Xpert Xpress SARS-CoV-2/FLU/RSV testing.  Fact Sheet for Patients: EntrepreneurPulse.com.au  Fact Sheet for Healthcare Providers: IncredibleEmployment.be  This test is not yet approved or cleared by the [place] FDA and has been authorized for detection and/or diagnosis of SARS-CoV-2 by FDA under an Emergency Use Authorization (EUA). This EUA will remain in effect (meaning this test can be used) for the duration of the COVID-19 declaration under  Section 564(b)(1) of the Act, 21 U.S.C. section 360bbb-3(b)(1), unless the authorization is terminated or revoked.  Performed at [facility], [place] [address], [place], [place] [address]   MRSA Next Gen by PCR, Nasal     Status: None   Collection Time: 05/12/21  3:53 AM   Specimen: Nasal Mucosa; Nasal Swab  Result Value Ref Range Status   MRSA by PCR Next Gen NOT DETECTED NOT DETECTED Final    Comment: (NOTE) The GeneXpert MRSA Assay (FDA approved for NASAL specimens only), is one component of a comprehensive MRSA colonization surveillance program. It is not intended to diagnose MRSA infection  nor to guide or monitor treatment for MRSA infections. Test performance is not FDA approved in patients less than 46 years old. Performed at [facility], [place] [address] [place], [place] [address]          Radiology Studies: DG CHEST PORT 1 VIEW  Result Date: 05/15/2021 CLINICAL DATA:  Fever EXAM: PORTABLE CHEST 1 VIEW COMPARISON:  05/12/2021 FINDINGS: Prior CABG. Cardiomegaly. No confluent airspace opacities, effusions or edema. No acute bony abnormality. IMPRESSION: Cardiomegaly. No active disease. Electronically Signed   By: Rolm Baptise M.D.   On: 05/15/2021 10:20        Scheduled Meds:  amLODipine  2.5 mg Oral Daily   apixaban  5 mg Oral BID   arformoterol  15 mcg Nebulization BID   azaTHIOprine  125 mg Oral Daily   budesonide  2 mL Nebulization BID   calcitRIOL  0.25 mcg Oral Q3 days   Chlorhexidine Gluconate Cloth  6 each Topical Daily   clopidogrel  75 mg Oral Daily   DULoxetine  60 mg Oral Daily   famotidine  20 mg Oral Daily   insulin aspart  0-15 Units Subcutaneous TID WC   insulin aspart  0-5 Units Subcutaneous QHS   isosorbide mononitrate  60 mg Oral Daily   lamoTRIgine  25 mg Oral QHS   loratadine  10 mg Oral Daily   LORazepam  1 mg Oral BID   magnesium oxide  400 mg Oral Daily   metoprolol tartrate  100 mg Oral BID   montelukast  10 mg Oral QHS    predniSONE  5 mg Oral q morning   rosuvastatin  20 mg Oral Daily   Continuous Infusions:   LOS: 3 days    Time spent: 32 minutes    Barb Merino, MD [facility] Pager [phone]

## 2021-05-15 NOTE — Progress Notes (Signed)
Progress Note  Patient Name: Katherine [name] Date of Encounter: 05/15/2021  Primary Cardiologist:   [name], MD   Subjective   She aches all over and just does not feel well.  No localizing symptoms except cough.  Low grade temp.    Inpatient Medications    Scheduled Meds:  apixaban  5 mg Oral BID   arformoterol  15 mcg Nebulization BID   azaTHIOprine  125 mg Oral Daily   budesonide  2 mL Nebulization BID   calcitRIOL  0.25 mcg Oral Q3 days   Chlorhexidine Gluconate Cloth  6 each Topical Daily   clopidogrel  75 mg Oral Daily   famotidine  20 mg Oral Daily   insulin aspart  0-6 Units Subcutaneous TID PC & HS   isosorbide mononitrate  60 mg Oral Daily   loratadine  10 mg Oral Daily   LORazepam  1 mg Oral BID   magnesium oxide  400 mg Oral Daily   metoprolol tartrate  100 mg Oral BID   montelukast  10 mg Oral QHS    morphine injection  1 mg Intravenous Once   predniSONE  5 mg Oral q morning   rosuvastatin  20 mg Oral Daily   Continuous Infusions:   PRN Meds: acetaminophen, docusate sodium, guaiFENesin, ipratropium-albuterol, nitroGLYCERIN, oxyCODONE, polyethylene glycol   Vital Signs    Vitals:   05/15/21 0300 05/15/21 0400 05/15/21 0500 05/15/21 0650  BP: (!) 109/91  (!) 148/61   Pulse: 60 66 61   Resp: (!) 27 (!) 24 19   Temp:    98.7 F (37.1 C)  TempSrc:    Axillary  SpO2: 93% 96% 97%   Weight:      Height:        Intake/Output Summary (Last 24 hours) at 05/15/2021 0801 Last data filed at 05/15/2021 0000 Gross per 24 hour  Intake 1080 ml  Output 1675 ml  Net -595 ml   Filed Weights   05/12/21 0004 05/12/21 0417 05/14/21 0500  Weight: 104.3 kg 96 kg 92.6 kg    Telemetry    NSR, atrial ectopy, no sustained ventricular arrhythmias - Personally Reviewed  ECG    NA- Personally Reviewed  Physical Exam   GEN: No acute distress.   Neck: No  JVD Cardiac: RRR, no murmurs, rubs, or gallops.  Respiratory: Bilateral basilar crackles GI:  Soft, nontender, non-distended, normal bowel sounds  MS:  No edema; No deformity. Neuro:   Nonfocal  Psych: Oriented and appropriate    Labs    Chemistry Recent Labs  Lab 05/12/21 0706 05/13/21 0533 05/13/21 1042 05/14/21 0034  NA 130* 139 134* 132*  K 4.0 3.1* 4.2 4.7  CL 100 106 102 104  CO2 20* 24 21* 21*  GLUCOSE 273* 92 177* 238*  BUN 17 13 14 14   CREATININE 1.44* 1.13* 1.16* 1.20*  CALCIUM 9.2 8.8* 8.4* 8.5*  PROT 7.0 5.9*  --   --   ALBUMIN 2.5* 2.1*  --   --   AST 24 14*  --   --   ALT 8 10  --   --   ALKPHOS 121 94  --   --   BILITOT 1.0 0.6  --   --   GFRNONAA 41* 55* 53* 51*  ANIONGAP 10 9 11 7      Hematology Recent Labs  Lab 05/12/21 0706 05/13/21 0533 05/14/21 0034  WBC 7.7 5.5 4.4  RBC 3.39* 2.91* 2.80*  HGB 10.1* 8.8*  8.5*  HCT 31.7* 27.5* 27.0*  MCV 93.5 94.5 96.4  MCH 29.8 30.2 30.4  MCHC 31.9 32.0 31.5  RDW 18.2* 18.4* 18.5*  PLT 285 250 222    Cardiac EnzymesNo results for input(s): TROPONINI in the last 168 hours. No results for input(s): TROPIPOC in the last 168 hours.   BNPNo results for input(s): BNP, PROBNP in the last 168 hours.   DDimer No results for input(s): DDIMER in the last 168 hours.   Radiology    No results found.  Cardiac Studies   Cath/PCI: 02/20/21   Mid RCA to Dist RCA lesion is 100% stenosed. Ost LAD to Prox LAD lesion is 50% stenosed. Prox LAD-1 lesion is 100% stenosed. Prox LAD-2 lesion is 100% stenosed. Ost Cx to Prox Cx lesion is 60% stenosed -focal eccentric heavily calcified lesion -> negative RFR (0.96) 1st Mrg lesion is 40% stenosed. 2nd Mrg lesion is 80% stenosed - focal SVG-RCA graft was visualized by angiography and is large. The graft exhibits minimal luminal irregularities. SCG-RCA: Mid Graft to Dist Graft lesion is 40% stenosed. LIMA graft was not visualized due to inability to cannulate. ------CULPRIT LESIONS - SVG-DIAG ISR & Native Diag 95% ------- SVG-Diag: Prox Graft to Mid Graft Stent is  10% stenosed. SVG-Diag: Dist Graft to Insertion Stent is 90% stenosed. Scoring balloon angioplasty was performed using a BALLOON SCOREFLEX 2.50X10. Followed by 2.75 mm Moosic balloon post dilation Post intervention, there is a 25% residual stenosis. Ost 1st Diag Stent (from Graft into native) is 80% re-stenosed. Native 1st Diag (immediately after the sten) is 95% stenosed. A drug-eluting stent was successfully placed overlapping from the distal stent edge crossing the lesion, using a STENT ONYX FRONTIER 2.25X12. -> Postdilated at the overlap segment to 2.6 mm, and distally to 2.4 mm -------------------------------------------- Post intervention, there is a 15% residual stenosis at the 80% site, and 0% residual stenosis at the 95% site.. LV end diastolic pressure is moderate-severely elevated.  Patient Profile     65 y.o. 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 She apparently called EMS due to recurrent syncope with WCT captured on pre-hospital rhythm strip. Cardiology consulted for further evaluation.   Assessment & Plan    WCT:   Maintaining NSR.    Prolonged QT with TdP.   Likely med related.   QTc is normal now.  Not planning to restart Tikosyn.   ATRIAL FIB:    Continues Eliquis.    Still holding NSR.  CHEST PAIN:      Medical management.    AKI:   Recovered.   Anemia: No labs yet today.  Follow per primary team.   HTN:  BP is up.  I will add Norvasc.    FEVER:  CXR pending and UA.  Per the primary team.      For questions or updates, please contact [place] Please consult [email or web address] for contact info under Cardiology/STEMI.   Signed, Minus Breeding, MD  05/15/2021, 8:01 AM

## 2021-05-15 NOTE — Progress Notes (Signed)
Physical Therapy Treatment Patient Details Name: Katherine [name] MRN: [id number] DOB: [birth year] Today's Date: 05/15/2021   History of Present Illness 65 yo female admitted 9/25 with syncope with Torsades de Pointes and bradycardia. PMhx: CAD, MI, CABG x3 (1995), A. fib on Eliquis and Tikosyn, HFpEF, HTN, HLD, PAD, ESRD, renal transplant in 1991, COPD on3L at home, depression, and T2DM    PT Comments    The pt presents with increased fatigue this session, but was eventually agreeable to OOB mobility. The pt self-limited to mobility within the room due to fatigue at this time, but was able to complete with minG, use of RW, and 3L O2. The pt continues to present with deficits in strength, endurance, and power, and for this will continue to benefit from skilled PT acutely to establish HEP and progress ambulation/activity tolerance.     Recommendations for follow up therapy are one component of a multi-disciplinary discharge planning process, led by the attending physician.  Recommendations may be updated based on patient status, additional functional criteria and insurance authorization.  Follow Up Recommendations  Home health PT;Supervision - Intermittent     Equipment Recommendations  None recommended by PT    Recommendations for Other Services       Precautions / Restrictions Precautions Precautions: Fall Precaution Comments: Pt reports h/o falls including 2 falls out of the bed Restrictions Weight Bearing Restrictions: No     Mobility  Bed Mobility Overal bed mobility: Modified Independent             General bed mobility comments: increased time and use of rail, no assist    Transfers Overall transfer level: Needs assistance Equipment used: Rolling walker (2 wheeled) Transfers: Sit to/from Stand Sit to Stand: Min guard         General transfer comment: pt needing cues for hand placement, but able to stand from EOB, BSC, and recliner without  assist  Ambulation/Gait Ambulation/Gait assistance: Min guard Gait Distance (Feet): 30 Feet Assistive device: Rolling walker (2 wheeled) Gait Pattern/deviations: Step-through pattern;Decreased stride length;Wide base of support;Trunk flexed Gait velocity: decreased   General Gait Details: pt only agreeable to short distance ambulation this session due to fatigue, no assist to complete nagivation to bathroom      Balance Overall balance assessment: Needs assistance;History of Falls Sitting-balance support: Feet supported Sitting balance-Leahy Scale: Fair Sitting balance - Comments: pt able to sit without UE support   Standing balance support: Bilateral upper extremity supported Standing balance-Leahy Scale: Poor Standing balance comment: RW for static standing and gait                            Cognition Arousal/Alertness: Awake/alert Behavior During Therapy: WFL for tasks assessed/performed Overall Cognitive Status: Impaired/Different from baseline                                 General Comments: pt oriented but requires significant encouragement to participate. The pt was eventually agreeable and then able to demo good safety awareness      Exercises      General Comments General comments (skin integrity, edema, etc.): VSS on 3L at start of session, SpO2 86% on 3L sitting in chair at end of session while being washed by NT.      Pertinent Vitals/Pain Pain Assessment: No/denies pain Pain Intervention(s): Monitored during session     PT Goals (current  goals can now be found in the care plan section) Acute Rehab PT Goals Patient Stated Goal: return home PT Goal Formulation: With patient Time For Goal Achievement: 05/27/21 Potential to Achieve Goals: Good Progress towards PT goals: Progressing toward goals    Frequency    Min 3X/week      PT Plan Current plan remains appropriate       AM-PAC PT "6 Clicks" Mobility   Outcome  Measure  Help needed turning from your back to your side while in a flat bed without using bedrails?: A Little Help needed moving from lying on your back to sitting on the side of a flat bed without using bedrails?: A Little Help needed moving to and from a bed to a chair (including a wheelchair)?: A Little Help needed standing up from a chair using your arms (e.g., wheelchair or bedside chair)?: A Little Help needed to walk in hospital room?: A Little Help needed climbing 3-5 steps with a railing? : A Lot 6 Click Score: 17    End of Session Equipment Utilized During Treatment: Gait belt Activity Tolerance: Patient tolerated treatment well Patient left: in chair;with call bell/phone within reach;with chair alarm set;with nursing/sitter in room Nurse Communication: Mobility status PT Visit Diagnosis: Other abnormalities of gait and mobility (R26.89);Repeated falls (R29.6);Muscle weakness (generalized) (M62.81)     Time: 4158-3094 PT Time Calculation (min) (ACUTE ONLY): 23 min  Charges:  $Gait Training: 8-22 mins $Therapeutic Activity: 8-22 mins                     [place] Carbo, PT, DPT   Acute Rehabilitation Department Pager #: [phone]   Sandra Cockayne 05/15/2021, 4:02 PM

## 2021-05-15 NOTE — Progress Notes (Signed)
Pt states she does not want to go on CPAP tonight. Currently resting on 3L [place]. RT will cont to monitor.

## 2021-05-16 ENCOUNTER — Other Ambulatory Visit: Payer: Self-pay | Admitting: Cardiology

## 2021-05-16 DIAGNOSIS — I152 Hypertension secondary to endocrine disorders: Secondary | ICD-10-CM | POA: Diagnosis not present

## 2021-05-16 DIAGNOSIS — E1159 Type 2 diabetes mellitus with other circulatory complications: Secondary | ICD-10-CM

## 2021-05-16 DIAGNOSIS — I480 Paroxysmal atrial fibrillation: Secondary | ICD-10-CM | POA: Diagnosis not present

## 2021-05-16 DIAGNOSIS — I472 Ventricular tachycardia: Secondary | ICD-10-CM | POA: Diagnosis not present

## 2021-05-16 DIAGNOSIS — E1142 Type 2 diabetes mellitus with diabetic polyneuropathy: Secondary | ICD-10-CM | POA: Diagnosis not present

## 2021-05-16 DIAGNOSIS — I509 Heart failure, unspecified: Secondary | ICD-10-CM

## 2021-05-16 LAB — CBC WITH DIFFERENTIAL/PLATELET
Abs Immature Granulocytes: 0.02 10*3/uL (ref 0.00–0.07)
Basophils Absolute: 0 10*3/uL (ref 0.0–0.1)
Basophils Relative: 0 %
Eosinophils Absolute: 0.3 10*3/uL (ref 0.0–0.5)
Eosinophils Relative: 7 %
HCT: 29.9 % — ABNORMAL LOW (ref 36.0–46.0)
Hemoglobin: 9.4 g/dL — ABNORMAL LOW (ref 12.0–15.0)
Immature Granulocytes: 0 %
Lymphocytes Relative: 21 %
Lymphs Abs: 0.9 10*3/uL (ref 0.7–4.0)
MCH: 29.9 pg (ref 26.0–34.0)
MCHC: 31.4 g/dL (ref 30.0–36.0)
MCV: 95.2 fL (ref 80.0–100.0)
Monocytes Absolute: 0.7 10*3/uL (ref 0.1–1.0)
Monocytes Relative: 17 %
Neutro Abs: 2.5 10*3/uL (ref 1.7–7.7)
Neutrophils Relative %: 55 %
Platelets: 277 10*3/uL (ref 150–400)
RBC: 3.14 MIL/uL — ABNORMAL LOW (ref 3.87–5.11)
RDW: 18.4 % — ABNORMAL HIGH (ref 11.5–15.5)
WBC: 4.5 10*3/uL (ref 4.0–10.5)
nRBC: 0.4 % — ABNORMAL HIGH (ref 0.0–0.2)

## 2021-05-16 LAB — PHOSPHORUS: Phosphorus: 3.6 mg/dL (ref 2.5–4.6)

## 2021-05-16 LAB — GLUCOSE, CAPILLARY
Glucose-Capillary: 104 mg/dL — ABNORMAL HIGH (ref 70–99)
Glucose-Capillary: 323 mg/dL — ABNORMAL HIGH (ref 70–99)
Glucose-Capillary: 167 mg/dL — ABNORMAL HIGH (ref 70–99)
Glucose-Capillary: 195 mg/dL — ABNORMAL HIGH (ref 70–99)

## 2021-05-16 LAB — BASIC METABOLIC PANEL
Anion gap: 8 (ref 5–15)
BUN: 9 mg/dL (ref 8–23)
CO2: 25 mmol/L (ref 22–32)
Calcium: 8.7 mg/dL — ABNORMAL LOW (ref 8.9–10.3)
Chloride: 101 mmol/L (ref 98–111)
Creatinine, Ser: 1 mg/dL (ref 0.44–1.00)
GFR, Estimated: >60 mL/min (ref >60)
Glucose, Bld: 114 mg/dL — ABNORMAL HIGH (ref 70–99)
Potassium: 3.7 mmol/L (ref 3.5–5.1)
Sodium: 134 mmol/L — ABNORMAL LOW (ref 135–145)

## 2021-05-16 LAB — MAGNESIUM: Magnesium: 2 mg/dL (ref 1.7–2.4)

## 2021-05-16 MED ORDER — FLUTICASONE PROPIONATE 50 MCG/ACT NA SUSP
2.0000 | Freq: Two times a day (BID) | NASAL | Status: DC
  Administered 2021-05-16 – 2021-05-18 (×4): 2 via NASAL
  Filled 2021-05-16: qty 16

## 2021-05-16 NOTE — Telephone Encounter (Signed)
This is Dr. Harding's pt. °

## 2021-05-16 NOTE — Progress Notes (Signed)
[facility] night shift.  The nursing staff reported that her patient would like her home nasal spray.  Flonase ordered per home meds list instructions.  Tennis Must, MD.

## 2021-05-16 NOTE — Progress Notes (Signed)
PROGRESS NOTE    Katherine [name]  GBT:[id number] DOB: 1956/08/02 DOA: 05/11/2021 PCP: [name], MD    Brief Narrative:  65 year old female with history of coronary artery disease status post CABG, persistent A. fib on Eliquis and Tikosyn, heart failure with preserved ejection fraction, hypertension, hyperlipidemia, peripheral artery disease, renal transplant, COPD and chronic hypoxemic respiratory failure on 3 L oxygen at home, depression and type 2 diabetes presented to the emergency room with syncope and palpitations.  She was noted to have passed out with her brother at the bedside.  EMS was called and found patient with [name] and ventricular bigeminy rhythm.  Patient complained of chest pain and shortness of breath on arrival.  On arrival to hospital, EKG strip showed polymorphic [name]/torsade.  Cardiology consulted.  Treated with magnesium and dopamine for mild bradycardia and admitted to ICU.  Troponins were negative.  EKG with QTC of 565 and heart rate of 50/min.  Admitted to ICU and stabilized.  Treated with dopamine for bradycardia transiently.   Assessment & Plan:   Principal Problem:   Torsades de pointes (HCC) Active Problems:   Paroxysmal atrial fibrillation ([place]); CHA2DS2VASc score F, HTN, CAD, CVA = 5   Type 2 diabetes mellitus with peripheral neuropathy (HCC)   Polymorphic ventricular tachycardia (HCC)   COPD (chronic obstructive pulmonary disease) (HCC)  Prolonged QT with torsades de pointes: At home on albuterol, Tikosyn, Cymbalta, Lamictal, sertraline, Lopressor and Phenergan.  Probably contributed by multiple medications. Treated with magnesium with improvement.  QT normalized. As per cardiology recommendation, keeping potassium more than 4, magnesium more than 2. Discontinued all QT prolonging medications.  Managed with Ativan for now. Resume patient's Cymbalta and Lamictal as she is already developing labile emotions and crying. Now she wants to see  what works for her nausea.  We will gradually introduce low-dose of Phenergan.  Phenergan is least dangerous among the nausea medications.  She does have significant history of gastroparesis and she will need some nausea medications.  Paroxysmal A. fib: Currently sinus rhythm.  Started back on beta-blockers.  Therapeutic on Eliquis.  Atypical chest pain: Acute coronary syndrome ruled out.  AKI with history of ESRD status postrenal transplant: Stable.  On prednisone and azathioprine.  Electrolytes adequate.  Renal functions are stable.  COPD with chronic hypoxemic respiratory failure: On 3 L oxygen at home.  Continue Pulmicort and Brovana.  Duo nebs as needed.  Oxygen to keep saturations more than 90%.  CPAP at night.  Type 2 diabetes: On glimepiride and insulin at home.  Currently remains on insulin.  Will uptitrate once her appetite is improving.  History of depression and fibromyalgia: On multiple medications. Continue home doses of Ativan. QTC improved.  Tikosyn discontinued. Started on Cymbalta and Lamictal.  Ativan as home doses.  Coronary artery disease: Stable.  Currently on Plavix along with Eliquis.  She is on a statin and beta-blockers.  Febrile episode: With low-grade fever.  Found to have abnormal urine.  Recently treated for E. coli.  Chest x-ray normal.  Blood cultures and urine culture sent.  Started on Rocephin.  Already improving.  Continue to mobilize.  Anticipate discharge home with home health tomorrow if remains afebrile for 24 hours.   DVT prophylaxis:  apixaban (ELIQUIS) tablet 5 mg   Code Status: Full code Family Communication: None. Disposition Plan: Status is: Inpatient  Remains inpatient appropriate because:Inpatient level of care appropriate due to severity of illness  Dispo: The patient is from: Home  Anticipated d/c is to: Home with home health therapies.              Patient currently is not medically stable to d/c.   Difficult to place  patient No         Consultants:  Cardiology PCCM  Procedures:  None  Antimicrobials:  None   Subjective: Patient seen and examined.  No overnight events.  Still feels chills, afebrile overnight.  He does hurt everywhere but better than yesterday. Telemetry shows occasional NSVT's.  QT interval 440.  Objective: Vitals:   05/16/21 0700 05/16/21 0743 05/16/21 0800 05/16/21 0900  BP:    138/66  Pulse: 72 79 71 74  Resp: 18 18 (!) 23 17  Temp:    98.1 F (36.7 C)  TempSrc:    Axillary  SpO2: 94% 99% 92% 90%  Weight:      Height:        Intake/Output Summary (Last 24 hours) at 05/16/2021 1056 Last data filed at 05/16/2021 0600 Gross per 24 hour  Intake 960 ml  Output 1100 ml  Net -140 ml   Filed Weights   05/12/21 0004 05/12/21 0417 05/14/21 0500  Weight: 104.3 kg 96 kg 92.6 kg    Examination:  General exam: Appears calm and comfortable  Frail and debilitated.  Chronically sick looking.  Not in any distress.  In normal mood today. Respiratory system: Bilateral clear.  Currently on 3 L oxygen.  Uses CPAP at night. Cardiovascular system: S1 & S2 heard, RRR.  No edema.   Gastrointestinal system: Abdomen is nondistended, soft and nontender. No organomegaly or masses felt. Normal bowel sounds heard. Central nervous system: Alert and oriented. No focal neurological deficits. Extremities: Symmetric 5 x 5 power.    Data Reviewed: I have personally reviewed following labs and imaging studies  CBC: Recent Labs  Lab 05/12/21 0022 05/12/21 0233 05/12/21 0706 05/13/21 0533 05/14/21 0034 05/16/21 0035  WBC 3.9*  --  7.7 5.5 4.4 4.5  NEUTROABS  --   --   --   --   --  2.5  HGB 9.8* 10.2* 10.1* 8.8* 8.5* 9.4*  HCT 31.7* 30.0* 31.7* 27.5* 27.0* 29.9*  MCV 97.2  --  93.5 94.5 96.4 95.2  PLT 232  --  285 250 222 269   Basic Metabolic Panel: Recent Labs  Lab 05/12/21 0022 05/12/21 0233 05/12/21 0706 05/13/21 0533 05/13/21 1042 05/14/21 0034 05/16/21 0035   NA 133*   < > 130* 139 134* 132* 134*  K 4.4   < > 4.0 3.1* 4.2 4.7 3.7  CL 103  --  100 106 102 104 101  CO2 21*  --  20* 24 21* 21* 25  GLUCOSE 264*  --  273* 92 177* 238* 114*  BUN 14  --  17 13 14 14 9   CREATININE 1.29*  --  1.44* 1.13* 1.16* 1.20* 1.00  CALCIUM 8.7*  --  9.2 8.8* 8.4* 8.5* 8.7*  MG 1.8  --  3.4* 2.4  --  2.1 2.0  PHOS  --   --  2.8  --   --   --  3.6   < > = values in this interval not displayed.   GFR: Estimated Creatinine Clearance: 59.7 mL/min (by C-G formula based on SCr of 1 mg/dL). Liver Function Tests: Recent Labs  Lab 05/12/21 0706 05/13/21 0533  AST 24 14*  ALT 8 10  ALKPHOS 121 94  BILITOT 1.0 0.6  PROT 7.0 5.9*  ALBUMIN 2.5* 2.1*   No results for input(s): LIPASE, AMYLASE in the last 168 hours. Recent Labs  Lab 05/12/21 1516  AMMONIA 22   Coagulation Profile: No results for input(s): INR, PROTIME in the last 168 hours. Cardiac Enzymes: No results for input(s): CKTOTAL, CKMB, CKMBINDEX, TROPONINI in the last 168 hours. BNP (last 3 results) No results for input(s): PROBNP in the last 8760 hours. HbA1C: No results for input(s): HGBA1C in the last 72 hours.  CBG: Recent Labs  Lab 05/15/21 1133 05/15/21 1609 05/15/21 1854 05/15/21 2111 05/16/21 0659  GLUCAP 228* 228* 254* 151* 104*   Lipid Profile: No results for input(s): CHOL, HDL, LDLCALC, TRIG, CHOLHDL, LDLDIRECT in the last 72 hours. Thyroid Function Tests: No results for input(s): TSH, T4TOTAL, FREET4, T3FREE, THYROIDAB in the last 72 hours. Anemia Panel: No results for input(s): VITAMINB12, FOLATE, FERRITIN, TIBC, IRON, RETICCTPCT in the last 72 hours. Sepsis Labs: No results for input(s): PROCALCITON, LATICACIDVEN in the last 168 hours.  Recent Results (from the past 240 hour(s))  Resp Panel by RT-PCR (Flu A&B, Covid) Nasopharyngeal Swab     Status: None   Collection Time: 05/12/21 12:22 AM   Specimen: Nasopharyngeal Swab; Nasopharyngeal(NP) swabs in vial transport  medium  Result Value Ref Range Status   SARS Coronavirus 2 by RT PCR NEGATIVE NEGATIVE Final    Comment: (NOTE) SARS-CoV-2 target nucleic acids are NOT DETECTED.  The SARS-CoV-2 RNA is generally detectable in upper respiratory specimens during the acute phase of infection. The lowest concentration of SARS-CoV-2 viral copies this assay can detect is 138 copies/mL. A negative result does not preclude SARS-Cov-2 infection and should not be used as the sole basis for treatment or other patient management decisions. A negative result may occur with  improper specimen collection/handling, submission of specimen other than nasopharyngeal swab, presence of viral mutation(s) within the areas targeted by this assay, and inadequate number of viral copies(<138 copies/mL). A negative result must be combined with clinical observations, patient history, and epidemiological information. The expected result is Negative.  Fact Sheet for Patients:  EntrepreneurPulse.com.au  Fact Sheet for Healthcare Providers:  IncredibleEmployment.be  This test is no t yet approved or cleared by the [place] FDA and  has been authorized for detection and/or diagnosis of SARS-CoV-2 by FDA under [name] Emergency Use Authorization (EUA). This EUA will remain  in effect (meaning this test can be used) for the duration of the COVID-19 declaration under Section 564(b)(1) of the Act, 21 U.S.C.section 360bbb-3(b)(1), unless the authorization is terminated  or revoked sooner.       Influenza A by PCR NEGATIVE NEGATIVE Final   Influenza B by PCR NEGATIVE NEGATIVE Final    Comment: (NOTE) The Xpert Xpress SARS-CoV-2/FLU/RSV plus assay is intended as [name] aid in the diagnosis of influenza from Nasopharyngeal swab specimens and should not be used as a sole basis for treatment. Nasal washings and aspirates are unacceptable for Xpert Xpress SARS-CoV-2/FLU/RSV testing.  Fact Sheet for  Patients: EntrepreneurPulse.com.au  Fact Sheet for Healthcare Providers: IncredibleEmployment.be  This test is not yet approved or cleared by the [place] FDA and has been authorized for detection and/or diagnosis of SARS-CoV-2 by FDA under [name] Emergency Use Authorization (EUA). This EUA will remain in effect (meaning this test can be used) for the duration of the COVID-19 declaration under Section 564(b)(1) of the Act, 21 U.S.C. section 360bbb-3(b)(1), unless the authorization is terminated or revoked.  Performed at [facility], [place],  [place] [address]   MRSA Next Gen by PCR, Nasal     Status: None   Collection Time: 05/12/21  3:53 AM   Specimen: Nasal Mucosa; Nasal Swab  Result Value Ref Range Status   MRSA by PCR Next Gen NOT DETECTED NOT DETECTED Final    Comment: (NOTE) The GeneXpert MRSA Assay (FDA approved for NASAL specimens only), is one component of a comprehensive MRSA colonization surveillance program. It is not intended to diagnose MRSA infection nor to guide or monitor treatment for MRSA infections. Test performance is not FDA approved in patients less than 29 years old. Performed at [facility], [place] [address] [place], [place] [address]   Culture, blood (routine x 2)     Status: None (Preliminary result)   Collection Time: 05/15/21  9:59 AM   Specimen: BLOOD  Result Value Ref Range Status   Specimen Description BLOOD RIGHT ANTECUBITAL  Final   Special Requests   Final    BOTTLES DRAWN AEROBIC AND ANAEROBIC Blood Culture adequate volume   Culture   Final    NO GROWTH < 24 HOURS Performed at [facility], [place] [address] [place], [place] [address]    Report Status PENDING  Incomplete  Culture, blood (routine x 2)     Status: None (Preliminary result)   Collection Time: 05/15/21 10:03 AM   Specimen: BLOOD  Result Value Ref Range Status   Specimen Description BLOOD RIGHT  ANTECUBITAL  Final   Special Requests   Final    BOTTLES DRAWN AEROBIC AND ANAEROBIC Blood Culture adequate volume   Culture   Final    NO GROWTH < 24 HOURS Performed at [facility], [place] [address] [place], [place] [address]    Report Status PENDING  Incomplete         Radiology Studies: DG CHEST PORT 1 VIEW  Result Date: 05/15/2021 CLINICAL DATA:  Fever EXAM: PORTABLE CHEST 1 VIEW COMPARISON:  05/12/2021 FINDINGS: Prior CABG. Cardiomegaly. No confluent airspace opacities, effusions or edema. No acute bony abnormality. IMPRESSION: Cardiomegaly. No active disease. Electronically Signed   By: Rolm Baptise M.D.   On: 05/15/2021 10:20        Scheduled Meds:  amLODipine  2.5 mg Oral Daily   apixaban  5 mg Oral BID   arformoterol  15 mcg Nebulization BID   azaTHIOprine  125 mg Oral Daily   budesonide  2 mL Nebulization BID   calcitRIOL  0.25 mcg Oral Q3 days   Chlorhexidine Gluconate Cloth  6 each Topical Daily   clopidogrel  75 mg Oral Daily   DULoxetine  60 mg Oral Daily   famotidine  20 mg Oral Daily   insulin aspart  0-15 Units Subcutaneous TID WC   insulin aspart  0-5 Units Subcutaneous QHS   isosorbide mononitrate  60 mg Oral Daily   lamoTRIgine  25 mg Oral QHS   loratadine  10 mg Oral Daily   LORazepam  1 mg Oral BID   magnesium oxide  400 mg Oral Daily   metoprolol tartrate  100 mg Oral BID   montelukast  10 mg Oral QHS   predniSONE  5 mg Oral q morning   rosuvastatin  20 mg Oral Daily   Continuous Infusions:  cefTRIAXone (ROCEPHIN)  IV 1 g (05/15/21 1900)     LOS: 4 days    Time spent: 30 minutes    Barb Merino, MD [facility] Pager [phone]

## 2021-05-16 NOTE — Progress Notes (Signed)
Progress Note  Patient Name: Katherine [name] Date of Encounter: 05/16/2021  Primary Cardiologist:   [name], MD   Subjective   She denies fevers or chills.  She has her usual aches and pains.  She did not feel any palpitations in the past 24 hours.   Inpatient Medications    Scheduled Meds:  amLODipine  2.5 mg Oral Daily   apixaban  5 mg Oral BID   arformoterol  15 mcg Nebulization BID   azaTHIOprine  125 mg Oral Daily   budesonide  2 mL Nebulization BID   calcitRIOL  0.25 mcg Oral Q3 days   Chlorhexidine Gluconate Cloth  6 each Topical Daily   clopidogrel  75 mg Oral Daily   DULoxetine  60 mg Oral Daily   famotidine  20 mg Oral Daily   insulin aspart  0-15 Units Subcutaneous TID WC   insulin aspart  0-5 Units Subcutaneous QHS   isosorbide mononitrate  60 mg Oral Daily   lamoTRIgine  25 mg Oral QHS   loratadine  10 mg Oral Daily   LORazepam  1 mg Oral BID   magnesium oxide  400 mg Oral Daily   metoprolol tartrate  100 mg Oral BID   montelukast  10 mg Oral QHS   predniSONE  5 mg Oral q morning   rosuvastatin  20 mg Oral Daily   Continuous Infusions:  cefTRIAXone (ROCEPHIN)  IV 1 g (05/15/21 1900)    PRN Meds: acetaminophen, docusate sodium, guaiFENesin, ipratropium-albuterol, nitroGLYCERIN, oxyCODONE, polyethylene glycol   Vital Signs    Vitals:   05/16/21 0500 05/16/21 0600 05/16/21 0655 05/16/21 0743  BP:      Pulse: 62 64  79  Resp: (!) 22 20  18   Temp:   98 F (36.7 C)   TempSrc:   Axillary   SpO2: 94% 94%  99%  Weight:      Height:        Intake/Output Summary (Last 24 hours) at 05/16/2021 0926 Last data filed at 05/16/2021 0600 Gross per 24 hour  Intake 960 ml  Output 1100 ml  Net -140 ml   Filed Weights   05/12/21 0004 05/12/21 0417 05/14/21 0500  Weight: 104.3 kg 96 kg 92.6 kg    Telemetry    NSR, brief runs of NSVT. - Personally Reviewed  ECG    NA- Personally Reviewed  Physical Exam   GENERAL:  Well appearing NECK:   No jugular venous distention, waveform within normal limits, carotid upstroke brisk and symmetric, no bruits, no thyromegaly LUNGS:  Clear to auscultation bilaterally CHEST:  Unremarkable HEART:  PMI not displaced or sustained,S1 and S2 within normal limits, no S3, no S4, no clicks, no rubs, soft apical systolic murmur, no diastoic murmurs ABD:  Flat, positive bowel sounds normal in frequency in pitch, no bruits, no rebound, no guarding, no midline pulsatile mass, no hepatomegaly, no splenomegaly EXT:  2 plus pulses throughout, no edema, no cyanosis no clubbing    Labs    Chemistry Recent Labs  Lab 05/12/21 0706 05/13/21 0533 05/13/21 1042 05/14/21 0034 05/16/21 0035  NA 130* 139 134* 132* 134*  K 4.0 3.1* 4.2 4.7 3.7  CL 100 106 102 104 101  CO2 20* 24 21* 21* 25  GLUCOSE 273* 92 177* 238* 114*  BUN 17 13 14 14 9   CREATININE 1.44* 1.13* 1.16* 1.20* 1.00  CALCIUM 9.2 8.8* 8.4* 8.5* 8.7*  PROT 7.0 5.9*  --   --   --  ALBUMIN 2.5* 2.1*  --   --   --   AST 24 14*  --   --   --   ALT 8 10  --   --   --   ALKPHOS 121 94  --   --   --   BILITOT 1.0 0.6  --   --   --   GFRNONAA 41* 55* 53* 51* >60  ANIONGAP 10 9 11 7 8      Hematology Recent Labs  Lab 05/13/21 0533 05/14/21 0034 05/16/21 0035  WBC 5.5 4.4 4.5  RBC 2.91* 2.80* 3.14*  HGB 8.8* 8.5* 9.4*  HCT 27.5* 27.0* 29.9*  MCV 94.5 96.4 95.2  MCH 30.2 30.4 29.9  MCHC 32.0 31.5 31.4  RDW 18.4* 18.5* 18.4*  PLT 250 222 277    Cardiac EnzymesNo results for input(s): TROPONINI in the last 168 hours. No results for input(s): TROPIPOC in the last 168 hours.   BNPNo results for input(s): BNP, PROBNP in the last 168 hours.   DDimer No results for input(s): DDIMER in the last 168 hours.   Radiology    DG CHEST PORT 1 VIEW  Result Date: 05/15/2021 CLINICAL DATA:  Fever EXAM: PORTABLE CHEST 1 VIEW COMPARISON:  05/12/2021 FINDINGS: Prior CABG. Cardiomegaly. No confluent airspace opacities, effusions or edema. No acute  bony abnormality. IMPRESSION: Cardiomegaly. No active disease. Electronically Signed   By: Rolm Baptise M.D.   On: 05/15/2021 10:20    Cardiac Studies   Cath/PCI: 02/20/21   Mid RCA to Dist RCA lesion is 100% stenosed. Ost LAD to Prox LAD lesion is 50% stenosed. Prox LAD-1 lesion is 100% stenosed. Prox LAD-2 lesion is 100% stenosed. Ost Cx to Prox Cx lesion is 60% stenosed -focal eccentric heavily calcified lesion -> negative RFR (0.96) 1st Mrg lesion is 40% stenosed. 2nd Mrg lesion is 80% stenosed - focal SVG-RCA graft was visualized by angiography and is large. The graft exhibits minimal luminal irregularities. SCG-RCA: Mid Graft to Dist Graft lesion is 40% stenosed. LIMA graft was not visualized due to inability to cannulate. ------CULPRIT LESIONS - SVG-DIAG ISR & Native Diag 95% ------- SVG-Diag: Prox Graft to Mid Graft Stent is 10% stenosed. SVG-Diag: Dist Graft to Insertion Stent is 90% stenosed. Scoring balloon angioplasty was performed using a BALLOON SCOREFLEX 2.50X10. Followed by 2.75 mm [place] balloon post dilation Post intervention, there is a 25% residual stenosis. Ost 1st Diag Stent (from Graft into native) is 80% re-stenosed. Native 1st Diag (immediately after the sten) is 95% stenosed. A drug-eluting stent was successfully placed overlapping from the distal stent edge crossing the lesion, using a STENT ONYX FRONTIER 2.25X12. -> Postdilated at the overlap segment to 2.6 mm, and distally to 2.4 mm -------------------------------------------- Post intervention, there is a 15% residual stenosis at the 80% site, and 0% residual stenosis at the 95% site.. LV end diastolic pressure is moderate-severely elevated.  Patient Profile     65 y.o. female with a history of CAD with MI in 1994 s/p PTCA, 3v CABG in 1995, and multiple subsequent PCIs for chest pain, as well as persistent AF (Eliquis and Tikosyn), HFpEF, HTN, PAD, renal transplant in 1991, COPD with CHRF (3L O2 baseline), arthritis  on chronic opiates, morbid obesity, OSA (not on CPAP), depression and type 2 DM. She apparently called EMS due to recurrent syncope with WCT captured on pre-hospital rhythm strip. Cardiology consulted for further evaluation.   Assessment & Plan    WCT:     NSR  with brief runs of SVT.    Magnesium and potassium are at target.  She is on beta blocker.  No change in therapy.    ATRIAL FIB:    Continues Eliquis.      CHEST PAIN:      Medical management.       AKI:   Creat has returned to baseline.   Anemia:   Hgb stable.    HTN:  Add Norvasc.    BP improved.    FEVER:    Positive UA.  CXR OK.  On Rocephin.       For questions or updates, please contact [place] Please consult [email or web address] for contact info under Cardiology/STEMI.   Signed, Minus Breeding, MD  05/16/2021, 9:26 AM

## 2021-05-16 NOTE — Progress Notes (Signed)
Inpatient Diabetes Program Recommendations  AACE/ADA: New Consensus Statement on Inpatient Glycemic Control (2015)  Target Ranges:  Prepandial:   less than 140 mg/dL      Peak postprandial:   less than 180 mg/dL (1-2 hours)      Critically ill patients:  140 - 180 mg/dL   Lab Results  Component Value Date   GLUCAP 323 (H) 05/16/2021   HGBA1C 6.9 (H) 05/12/2021    Review of Glycemic Control Results for Katherine [name], Katherine [name] (MRN [id number]) as of 05/16/2021 12:17  Ref. Range 05/15/2021 18:54 05/15/2021 21:11 05/16/2021 06:59 05/16/2021 11:28  Glucose-Capillary Latest Ref Range: 70 - 99 mg/dL 254 (H) 151 (H) 104 (H) 323 (H)   Diabetes history: Type 2 DM Outpatient Diabetes medications: Amaryl 4 mg QD, Novolog 14 units TID, Basaglar 12 units QD, Metformin 500 mg BID Current orders for Inpatient glycemic control: Novolog 0-15 units TID & HS Prednisone 5 mg QD Inpatient Diabetes Program Recommendations:    If post prandials continue to remain elevated, consider adding Novolog 3 units TID (Assuming patient is consuming >50% of meals).   Thanks, Bronson Curb, MSN, RNC-OB Diabetes Coordinator [phone] (8a-5p)

## 2021-05-17 DIAGNOSIS — I4721 Torsades de pointes: Secondary | ICD-10-CM | POA: Diagnosis not present

## 2021-05-17 LAB — CBC WITH DIFFERENTIAL/PLATELET
Abs Immature Granulocytes: 0.03 10*3/uL (ref 0.00–0.07)
Basophils Absolute: 0 10*3/uL (ref 0.0–0.1)
Basophils Relative: 0 %
Eosinophils Absolute: 0.3 10*3/uL (ref 0.0–0.5)
Eosinophils Relative: 7 %
HCT: 28.5 % — ABNORMAL LOW (ref 36.0–46.0)
Hemoglobin: 9 g/dL — ABNORMAL LOW (ref 12.0–15.0)
Immature Granulocytes: 1 %
Lymphocytes Relative: 20 %
Lymphs Abs: 0.9 10*3/uL (ref 0.7–4.0)
MCH: 30 pg (ref 26.0–34.0)
MCHC: 31.6 g/dL (ref 30.0–36.0)
MCV: 95 fL (ref 80.0–100.0)
Monocytes Absolute: 0.6 10*3/uL (ref 0.1–1.0)
Monocytes Relative: 13 %
Neutro Abs: 2.8 10*3/uL (ref 1.7–7.7)
Neutrophils Relative %: 59 %
Platelets: 286 10*3/uL (ref 150–400)
RBC: 3 MIL/uL — ABNORMAL LOW (ref 3.87–5.11)
RDW: 18.3 % — ABNORMAL HIGH (ref 11.5–15.5)
WBC: 4.7 10*3/uL (ref 4.0–10.5)
nRBC: 0 % (ref 0.0–0.2)

## 2021-05-17 LAB — GLUCOSE, CAPILLARY
Glucose-Capillary: 170 mg/dL — ABNORMAL HIGH (ref 70–99)
Glucose-Capillary: 172 mg/dL — ABNORMAL HIGH (ref 70–99)
Glucose-Capillary: 369 mg/dL — ABNORMAL HIGH (ref 70–99)
Glucose-Capillary: 259 mg/dL — ABNORMAL HIGH (ref 70–99)

## 2021-05-17 NOTE — Progress Notes (Signed)
Pt refused CPAP for tonight. RT instructed pt to have RT called if she changes her mind. RT will monitor as needed. °

## 2021-05-17 NOTE — Plan of Care (Signed)
  Problem: Health Behavior/Discharge Planning: Goal: Ability to manage health-related needs will improve Outcome: Progressing   Problem: Coping: Goal: Level of anxiety will decrease Outcome: Progressing   

## 2021-05-17 NOTE — Progress Notes (Signed)
Pt stated she would call this RT when she wants to be placed on CPAP

## 2021-05-17 NOTE — Plan of Care (Signed)
  Problem: Coping: Goal: Level of anxiety will decrease Outcome: Not Progressing   

## 2021-05-17 NOTE — Progress Notes (Signed)
Progress Note  Patient Name: Katherine [name] Date of Encounter: 05/17/2021  Primary Cardiologist:   [name], MD   Subjective   No new chest pain or palpitations.  Aches all over.   Inpatient Medications    Scheduled Meds:  amLODipine  2.5 mg Oral Daily   apixaban  5 mg Oral BID   arformoterol  15 mcg Nebulization BID   azaTHIOprine  125 mg Oral Daily   budesonide  2 mL Nebulization BID   calcitRIOL  0.25 mcg Oral Q3 days   Chlorhexidine Gluconate Cloth  6 each Topical Daily   clopidogrel  75 mg Oral Daily   DULoxetine  60 mg Oral Daily   famotidine  20 mg Oral Daily   fluticasone  2 spray Each Nare BID   insulin aspart  0-15 Units Subcutaneous TID WC   insulin aspart  0-5 Units Subcutaneous QHS   isosorbide mononitrate  60 mg Oral Daily   lamoTRIgine  25 mg Oral QHS   loratadine  10 mg Oral Daily   LORazepam  1 mg Oral BID   magnesium oxide  400 mg Oral Daily   metoprolol tartrate  100 mg Oral BID   montelukast  10 mg Oral QHS   predniSONE  5 mg Oral q morning   rosuvastatin  20 mg Oral Daily   Continuous Infusions:  cefTRIAXone (ROCEPHIN)  IV 1 g (05/16/21 1830)    PRN Meds: acetaminophen, docusate sodium, guaiFENesin, ipratropium-albuterol, nitroGLYCERIN, oxyCODONE, polyethylene glycol   Vital Signs    Vitals:   05/16/21 2114 05/16/21 2300 05/17/21 0300 05/17/21 0519  BP: (!) 144/79 (!) 146/73 (!) 145/66   Pulse: 73 73 64   Resp:  20 19   Temp:  98.7 F (37.1 C) 97.8 F (36.6 C)   TempSrc:  Axillary Axillary   SpO2:  95% 94%   Weight:    93.1 kg  Height:        Intake/Output Summary (Last 24 hours) at 05/17/2021 0735 Last data filed at 05/17/2021 0358 Gross per 24 hour  Intake 450 ml  Output 2400 ml  Net -1950 ml   Filed Weights   05/12/21 0417 05/14/21 0500 05/17/21 0519  Weight: 96 kg 92.6 kg 93.1 kg    Telemetry    NSR. - Personally Reviewed  ECG    NSR, rate 65, QTC WNL, inferolateral T wave inversion unchanged from  previous. - Personally Reviewed  Physical Exam   GEN: No  acute distress.   Neck: No  JVD Cardiac: RRR, slight systolic murmur, no diastolic murmurs, rubs, or gallops.  Respiratory:      Decreased breath sounds at the bases with coarse crackles.  GI: Soft, nontender, non-distended, normal bowel sounds  MS:  No edema; No deformity. Neuro:   Nonfocal  Psych: Oriented and appropriate     Labs    Chemistry Recent Labs  Lab 05/12/21 0706 05/13/21 0533 05/13/21 1042 05/14/21 0034 05/16/21 0035  NA 130* 139 134* 132* 134*  K 4.0 3.1* 4.2 4.7 3.7  CL 100 106 102 104 101  CO2 20* 24 21* 21* 25  GLUCOSE 273* 92 177* 238* 114*  BUN 17 13 14 14 9   CREATININE 1.44* 1.13* 1.16* 1.20* 1.00  CALCIUM 9.2 8.8* 8.4* 8.5* 8.7*  PROT 7.0 5.9*  --   --   --   ALBUMIN 2.5* 2.1*  --   --   --   AST 24 14*  --   --   --  ALT 8 10  --   --   --   ALKPHOS 121 94  --   --   --   BILITOT 1.0 0.6  --   --   --   GFRNONAA 41* 55* 53* 51* >60  ANIONGAP 10 9 11 7 8      Hematology Recent Labs  Lab 05/14/21 0034 05/16/21 0035 05/17/21 0026  WBC 4.4 4.5 4.7  RBC 2.80* 3.14* 3.00*  HGB 8.5* 9.4* 9.0*  HCT 27.0* 29.9* 28.5*  MCV 96.4 95.2 95.0  MCH 30.4 29.9 30.0  MCHC 31.5 31.4 31.6  RDW 18.5* 18.4* 18.3*  PLT 222 277 286    Cardiac EnzymesNo results for input(s): TROPONINI in the last 168 hours. No results for input(s): TROPIPOC in the last 168 hours.   BNPNo results for input(s): BNP, PROBNP in the last 168 hours.   DDimer No results for input(s): DDIMER in the last 168 hours.   Radiology    DG CHEST PORT 1 VIEW  Result Date: 05/15/2021 CLINICAL DATA:  Fever EXAM: PORTABLE CHEST 1 VIEW COMPARISON:  05/12/2021 FINDINGS: Prior CABG. Cardiomegaly. No confluent airspace opacities, effusions or edema. No acute bony abnormality. IMPRESSION: Cardiomegaly. No active disease. Electronically Signed   By: Rolm Baptise M.D.   On: 05/15/2021 10:20    Cardiac Studies   Cath/PCI: 02/20/21    Mid RCA to Dist RCA lesion is 100% stenosed. Ost LAD to Prox LAD lesion is 50% stenosed. Prox LAD-1 lesion is 100% stenosed. Prox LAD-2 lesion is 100% stenosed. Ost Cx to Prox Cx lesion is 60% stenosed -focal eccentric heavily calcified lesion -> negative RFR (0.96) 1st Mrg lesion is 40% stenosed. 2nd Mrg lesion is 80% stenosed - focal SVG-RCA graft was visualized by angiography and is large. The graft exhibits minimal luminal irregularities. SCG-RCA: Mid Graft to Dist Graft lesion is 40% stenosed. LIMA graft was not visualized due to inability to cannulate. ------CULPRIT LESIONS - SVG-DIAG ISR & Native Diag 95% ------- SVG-Diag: Prox Graft to Mid Graft Stent is 10% stenosed. SVG-Diag: Dist Graft to Insertion Stent is 90% stenosed. Scoring balloon angioplasty was performed using a BALLOON SCOREFLEX 2.50X10. Followed by 2.75 mm Lipscomb balloon post dilation Post intervention, there is a 25% residual stenosis. Ost 1st Diag Stent (from Graft into native) is 80% re-stenosed. Native 1st Diag (immediately after the sten) is 95% stenosed. A drug-eluting stent was successfully placed overlapping from the distal stent edge crossing the lesion, using a STENT ONYX FRONTIER 2.25X12. -> Postdilated at the overlap segment to 2.6 mm, and distally to 2.4 mm -------------------------------------------- Post intervention, there is a 15% residual stenosis at the 80% site, and 0% residual stenosis at the 95% site.. LV end diastolic pressure is moderate-severely elevated.  Patient Profile     65 y.o. 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 She apparently called EMS due to recurrent syncope with WCT captured on pre-hospital rhythm  strip. Cardiology consulted for further evaluation.   Assessment & Plan    WCT:      NSR with brief runs of SVT.    Magnesium and potassium are at target.  She is on beta blocker.  No change in therapy.  ATRIAL FIB:    Continues Eliquis and beta blocker.      CHEST PAIN:      Medical management.       AKI:   Creat has returned to baseline.   Anemia:   Hgb unchanged. [name] Kitchen    HTN:  Add Norvasc.    BP is labile.    FEVER:    Positive UA.  CXR OK.  Being treated.   [facility] will sign off.   Medication Recommendations:  Meds as on MAR Other recommendations (labs, testing, etc):   Follow up as an outpatient:  We will call to arrange follow up.        For questions or updates, please contact [place] Please consult [email or web address] for contact info under Cardiology/STEMI.   Signed, Minus Breeding, MD  05/17/2021, 7:35 AM

## 2021-05-17 NOTE — Plan of Care (Signed)
  Problem: Education: Goal: Knowledge of General Education information will improve Description: Including pain rating scale, medication(s)/side effects and non-pharmacologic comfort measures Outcome: Progressing   Problem: Health Behavior/Discharge Planning: Goal: Ability to manage health-related needs will improve Outcome: Progressing   Problem: Clinical Measurements: Goal: Ability to maintain clinical measurements within normal limits will improve Outcome: Progressing Goal: Will remain free from infection Outcome: Progressing Goal: Diagnostic test results will improve Outcome: Progressing Goal: Respiratory complications will improve Outcome: Progressing Goal: Cardiovascular complication will be avoided Outcome: Progressing   Problem: Activity: Goal: Risk for activity intolerance will decrease Outcome: Progressing   Problem: Nutrition: Goal: Adequate nutrition will be maintained Outcome: Progressing   Problem: Coping: Goal: Level of anxiety will decrease Outcome: Progressing   Problem: Elimination: Goal: Will not experience complications related to bowel motility Outcome: Progressing Goal: Will not experience complications related to urinary retention Outcome: Progressing   Problem: Pain Managment: Goal: General experience of comfort will improve Outcome: Progressing   Problem: Safety: Goal: Ability to remain free from injury will improve Outcome: Progressing   Problem: Skin Integrity: Goal: Risk for impaired skin integrity will decrease Outcome: Progressing   Problem: Education: Goal: Ability to manage disease process will improve Outcome: Progressing Goal: Individualized Educational Video(s) Outcome: Progressing   Problem: Cardiac: Goal: Ability to achieve and maintain adequate cardiopulmonary perfusion will improve Outcome: Progressing

## 2021-05-17 NOTE — TOC Transition Note (Signed)
Transition of Care [facility]) - CM/SW Discharge Note   Patient Details  Name: Katherine [name] MRN: [id number] Date of Birth: [birth year]  [facility] CM/SW Contact:  Zenon Mayo, RN Phone Number: 05/17/2021, 12:52 PM   Clinical Narrative:    Patient is set up with [facility] by previous NCM .  Patient is for possible dc today. NCM notified Tommi Rumps with [facility] of possible dc today.    Final next level of care: Home w Home Health Services Barriers to Discharge: No Barriers Identified   Patient Goals and CMS Choice Patient states their goals for this hospitalization and ongoing recovery are:: to return home with brother at his home   Choice offered to / list presented to : Patient, [facility] POA / Guardian (Niece wants the patient to use [facility] again for services.)  Discharge Placement                       Discharge Plan and Services In-house Referral: NA Discharge Planning Services: CM Consult Post Acute Care Choice: Home Health, Resumption of Svcs/PTA Provider            DME Agency: NA       HH Arranged: RN, Disease Management, PT, Social Work [employer] Agency: [place] Date [place]: 05/14/21 Time HH Agency Contacted: 1300 Representative spoke with at [place]: [place] ([place]) Interventions     Readmission Risk Interventions Readmission Risk Prevention Plan 05/14/2021 02/21/2021 01/31/2021  Transportation Screening Complete Complete Complete  HRI or Home Care Consult - - Complete  Social Work Consult for [place] Planning/Counseling - - Complete  Palliative Care Screening - - Not Applicable  Medication Review [employer] photographer) Complete Complete Complete  PCP or Specialist appointment within 3-5 days of discharge Complete Complete -  [place] or Home Care Consult Complete Complete -  SW Recovery Care/Counseling Consult Complete Complete -  Palliative Care Screening Not Applicable Not Applicable -  [place] Not Applicable Not Applicable -  Some recent data might be hidden

## 2021-05-17 NOTE — Progress Notes (Signed)
PROGRESS NOTE    Katherine [name]  GQQ:[id number] DOB: 1956/04/27 DOA: 05/11/2021 PCP: [name], MD    Brief Narrative:  65 year old female with history of coronary artery disease status post CABG, persistent A. fib on Eliquis and Tikosyn, heart failure with preserved ejection fraction, hypertension, hyperlipidemia, peripheral artery disease, renal transplant, COPD and chronic hypoxemic respiratory failure on 3 L oxygen at home, depression and type 2 diabetes presented to the emergency room with syncope and palpitations.  She was noted to have passed out with her brother at the bedside.  EMS was called and found patient with [name] and ventricular bigeminy rhythm.  Patient complained of chest pain and shortness of breath on arrival.  On arrival to hospital, EKG strip showed polymorphic [name]/torsade.  Cardiology consulted.  Treated with magnesium and dopamine for mild bradycardia and admitted to ICU.  Troponins were negative.  EKG with QTC of 565 and heart rate of 50/min.  Admitted to ICU and stabilized.  Transiently treated with dopamine for bradycardia.   Assessment & Plan:   Principal Problem:   Torsades de pointes Active Problems:   Paroxysmal atrial fibrillation ([place]); CHA2DS2VASc score F, HTN, CAD, CVA = 5   Type 2 diabetes mellitus with peripheral neuropathy (HCC)   Polymorphic ventricular tachycardia   COPD (chronic obstructive pulmonary disease) (HCC)  Prolonged QT with torsades de pointes: Probably contributed by multiple medications. Treated with magnesium with improvement.  QT normalized. As per cardiology recommendation, keeping potassium more than 4, magnesium more than 2. Discontinued all QT prolonging medications.  Managed with Ativan for now. Resume patient's Cymbalta and Lamictal as she is already developing labile emotions and crying. She does have a history of significant gastroparesis, will start introducing Phenergan in low doses as needed.  Paroxysmal  A. fib: Currently sinus rhythm.  Started back on beta-blockers.  Therapeutic on Eliquis.  Atypical chest pain: Acute coronary syndrome ruled out.  AKI with history of ESRD status postrenal transplant: Stable.  On prednisone and azathioprine.  Electrolytes adequate.  Renal functions are stable.  COPD with chronic hypoxemic respiratory failure: On 3 L oxygen at home.  Continue Pulmicort and Brovana.  Duo nebs as needed.  Oxygen to keep saturations more than 90%.  CPAP at night.  Type 2 diabetes: On glimepiride and insulin at home.  Currently remains on insulin.  Stable.  History of depression and fibromyalgia: On multiple medications. Continue home doses of Ativan. QTC improved.  Tikosyn discontinued. Started on Cymbalta and Lamictal.  Ativan as home doses.  Can take Phenergan.  Coronary artery disease: Stable.  Currently on Plavix along with Eliquis.  She is on statin and beta-blockers.  Febrile episode: Developed low-grade fever and myalgia.  Urinalysis abnormal.  Cultures negative so far.  Will treat with 3 days of Rocephin.   Continue to mobilize.  She is medically stable.  Her brother is only available tomorrow to take her home.  Patient has multiple chronic issues and needs some support at home.  Anticipate discharge home tomorrow.   DVT prophylaxis:  apixaban (ELIQUIS) tablet 5 mg   Code Status: Full code Family Communication: None. Disposition Plan: Status is: Inpatient  Remains inpatient appropriate because:Inpatient level of care appropriate due to severity of illness  Dispo: The patient is from: Home              Anticipated d/c is to: Home with home health therapies.  Patient currently is not medically stable to d/c.   Difficult to place patient No         Consultants:  Cardiology PCCM  Procedures:  None  Antimicrobials:  None   Subjective: Seen and examined.  Afebrile.  She wants to make sure I do not discontinue her Ativan.  Wants to use  some Phenergan.  She has diffuse abdominal pain today.  Denies any nausea vomiting.  Eating full meal.  Objective: Vitals:   05/16/21 2300 05/17/21 0300 05/17/21 0519 05/17/21 0700  BP: (!) 146/73 (!) 145/66  (!) 107/58  Pulse: 73 64  62  Resp: 20 19  (!) 24  Temp: 98.7 F (37.1 C) 97.8 F (36.6 C)  97.9 F (36.6 C)  TempSrc: Axillary Axillary  Axillary  SpO2: 95% 94%  93%  Weight:   93.1 kg   Height:        Intake/Output Summary (Last 24 hours) at 05/17/2021 1051 Last data filed at 05/17/2021 0358 Gross per 24 hour  Intake 350 ml  Output 2400 ml  Net -2050 ml   Filed Weights   05/12/21 0417 05/14/21 0500 05/17/21 0519  Weight: 96 kg 92.6 kg 93.1 kg    Examination:  General exam: Appears calm and comfortable  Frail and debilitated.  Not in any distress. Respiratory system: Bilateral clear.  Currently on 3 L oxygen.  Uses CPAP at night. Cardiovascular system: S1 & S2 heard, RRR.  No edema.   Gastrointestinal system: Abdomen is nondistended, soft and mild tenderness in the epigastrium. No organomegaly or masses felt. Normal bowel sounds heard. Central nervous system: Alert and oriented. No focal neurological deficits. Extremities: Symmetric 5 x 5 power.    Data Reviewed: I have personally reviewed following labs and imaging studies  CBC: Recent Labs  Lab 05/12/21 0706 05/13/21 0533 05/14/21 0034 05/16/21 0035 05/17/21 0026  WBC 7.7 5.5 4.4 4.5 4.7  NEUTROABS  --   --   --  2.5 2.8  HGB 10.1* 8.8* 8.5* 9.4* 9.0*  HCT 31.7* 27.5* 27.0* 29.9* 28.5*  MCV 93.5 94.5 96.4 95.2 95.0  PLT 285 250 222 277 923   Basic Metabolic Panel: Recent Labs  Lab 05/12/21 0022 05/12/21 0233 05/12/21 0706 05/13/21 0533 05/13/21 1042 05/14/21 0034 05/16/21 0035  NA 133*   < > 130* 139 134* 132* 134*  K 4.4   < > 4.0 3.1* 4.2 4.7 3.7  CL 103  --  100 106 102 104 101  CO2 21*  --  20* 24 21* 21* 25  GLUCOSE 264*  --  273* 92 177* 238* 114*  BUN 14  --  17 13 14 14 9    CREATININE 1.29*  --  1.44* 1.13* 1.16* 1.20* 1.00  CALCIUM 8.7*  --  9.2 8.8* 8.4* 8.5* 8.7*  MG 1.8  --  3.4* 2.4  --  2.1 2.0  PHOS  --   --  2.8  --   --   --  3.6   < > = values in this interval not displayed.   GFR: Estimated Creatinine Clearance: 59.9 mL/min (by C-G formula based on SCr of 1 mg/dL). Liver Function Tests: Recent Labs  Lab 05/12/21 0706 05/13/21 0533  AST 24 14*  ALT 8 10  ALKPHOS 121 94  BILITOT 1.0 0.6  PROT 7.0 5.9*  ALBUMIN 2.5* 2.1*   No results for input(s): LIPASE, AMYLASE in the last 168 hours. Recent Labs  Lab 05/12/21 1516  AMMONIA 22  Coagulation Profile: No results for input(s): INR, PROTIME in the last 168 hours. Cardiac Enzymes: No results for input(s): CKTOTAL, CKMB, CKMBINDEX, TROPONINI in the last 168 hours. BNP (last 3 results) No results for input(s): PROBNP in the last 8760 hours. HbA1C: No results for input(s): HGBA1C in the last 72 hours.  CBG: Recent Labs  Lab 05/16/21 0659 05/16/21 1128 05/16/21 1601 05/16/21 2123 05/17/21 0630  GLUCAP 104* 323* 167* 195* 170*   Lipid Profile: No results for input(s): CHOL, HDL, LDLCALC, TRIG, CHOLHDL, LDLDIRECT in the last 72 hours. Thyroid Function Tests: No results for input(s): TSH, T4TOTAL, FREET4, T3FREE, THYROIDAB in the last 72 hours. Anemia Panel: No results for input(s): VITAMINB12, FOLATE, FERRITIN, TIBC, IRON, RETICCTPCT in the last 72 hours. Sepsis Labs: No results for input(s): PROCALCITON, LATICACIDVEN in the last 168 hours.  Recent Results (from the past 240 hour(s))  Resp Panel by RT-PCR (Flu A&B, Covid) Nasopharyngeal Swab     Status: None   Collection Time: 05/12/21 12:22 AM   Specimen: Nasopharyngeal Swab; Nasopharyngeal(NP) swabs in vial transport medium  Result Value Ref Range Status   SARS Coronavirus 2 by RT PCR NEGATIVE NEGATIVE Final    Comment: (NOTE) SARS-CoV-2 target nucleic acids are NOT DETECTED.  The SARS-CoV-2 RNA is generally detectable in  upper respiratory specimens during the acute phase of infection. The lowest concentration of SARS-CoV-2 viral copies this assay can detect is 138 copies/mL. A negative result does not preclude SARS-Cov-2 infection and should not be used as the sole basis for treatment or other patient management decisions. A negative result may occur with  improper specimen collection/handling, submission of specimen other than nasopharyngeal swab, presence of viral mutation(s) within the areas targeted by this assay, and inadequate number of viral copies(<138 copies/mL). A negative result must be combined with clinical observations, patient history, and epidemiological information. The expected result is Negative.  Fact Sheet for Patients:  EntrepreneurPulse.com.au  Fact Sheet for Healthcare Providers:  IncredibleEmployment.be  This test is no t yet approved or cleared by the [place] FDA and  has been authorized for detection and/or diagnosis of SARS-CoV-2 by FDA under an Emergency Use Authorization (EUA). This EUA will remain  in effect (meaning this test can be used) for the duration of the COVID-19 declaration under Section 564(b)(1) of the Act, 21 U.S.C.section 360bbb-3(b)(1), unless the authorization is terminated  or revoked sooner.       Influenza A by PCR NEGATIVE NEGATIVE Final   Influenza B by PCR NEGATIVE NEGATIVE Final    Comment: (NOTE) The Xpert Xpress SARS-CoV-2/FLU/RSV plus assay is intended as an aid in the diagnosis of influenza from Nasopharyngeal swab specimens and should not be used as a sole basis for treatment. Nasal washings and aspirates are unacceptable for Xpert Xpress SARS-CoV-2/FLU/RSV testing.  Fact Sheet for Patients: EntrepreneurPulse.com.au  Fact Sheet for Healthcare Providers: IncredibleEmployment.be  This test is not yet approved or cleared by the [place] FDA and has been  authorized for detection and/or diagnosis of SARS-CoV-2 by FDA under an Emergency Use Authorization (EUA). This EUA will remain in effect (meaning this test can be used) for the duration of the COVID-19 declaration under Section 564(b)(1) of the Act, 21 U.S.C. section 360bbb-3(b)(1), unless the authorization is terminated or revoked.  Performed at [facility], [place] [address] [place], [place] [address]   MRSA Next Gen by PCR, Nasal     Status: None   Collection Time: 05/12/21  3:53 AM   Specimen:  Nasal Mucosa; Nasal Swab  Result Value Ref Range Status   MRSA by PCR Next Gen NOT DETECTED NOT DETECTED Final    Comment: (NOTE) The GeneXpert MRSA Assay (FDA approved for NASAL specimens only), is one component of a comprehensive MRSA colonization surveillance program. It is not intended to diagnose MRSA infection nor to guide or monitor treatment for MRSA infections. Test performance is not FDA approved in patients less than 109 years old. Performed at [facility], [place] [address], [place], [place] [address]   Culture, blood (routine x 2)     Status: None (Preliminary result)   Collection Time: 05/15/21  9:59 AM   Specimen: BLOOD  Result Value Ref Range Status   Specimen Description BLOOD RIGHT ANTECUBITAL  Final   Special Requests   Final    BOTTLES DRAWN AEROBIC AND ANAEROBIC Blood Culture adequate volume   Culture   Final    NO GROWTH 2 DAYS Performed at [facility], [place] [address], [place], [place] [address]    Report Status PENDING  Incomplete  Culture, blood (routine x 2)     Status: None (Preliminary result)   Collection Time: 05/15/21 10:03 AM   Specimen: BLOOD  Result Value Ref Range Status   Specimen Description BLOOD RIGHT ANTECUBITAL  Final   Special Requests   Final    BOTTLES DRAWN AEROBIC AND ANAEROBIC Blood Culture adequate volume   Culture   Final    NO GROWTH 2 DAYS Performed at [facility], [place] [address], [place], [place]  [address]    Report Status PENDING  Incomplete         Radiology Studies: No results found.      Scheduled Meds:  amLODipine  2.5 mg Oral Daily   apixaban  5 mg Oral BID   arformoterol  15 mcg Nebulization BID   azaTHIOprine  125 mg Oral Daily   budesonide  2 mL Nebulization BID   calcitRIOL  0.25 mcg Oral Q3 days   Chlorhexidine Gluconate Cloth  6 each Topical Daily   clopidogrel  75 mg Oral Daily   DULoxetine  60 mg Oral Daily   famotidine  20 mg Oral Daily   fluticasone  2 spray Each Nare BID   insulin aspart  0-15 Units Subcutaneous TID WC   insulin aspart  0-5 Units Subcutaneous QHS   isosorbide mononitrate  60 mg Oral Daily   lamoTRIgine  25 mg Oral QHS   loratadine  10 mg Oral Daily   LORazepam  1 mg Oral BID   magnesium oxide  400 mg Oral Daily   metoprolol tartrate  100 mg Oral BID   montelukast  10 mg Oral QHS   predniSONE  5 mg Oral q morning   rosuvastatin  20 mg Oral Daily   Continuous Infusions:  cefTRIAXone (ROCEPHIN)  IV 1 g (05/16/21 1830)     LOS: 5 days    Time spent: 30 minutes    Barb Merino, MD [facility] Pager [phone]

## 2021-05-17 NOTE — Progress Notes (Signed)
Dr. Percival Spanish requested f/u OV in 10 days - I have sent a message to our office's scheduling team requesting a follow-up appointment, and our office will call the patient with this information. Relayed this on AVS.

## 2021-05-18 DIAGNOSIS — I4721 Torsades de pointes: Secondary | ICD-10-CM | POA: Diagnosis not present

## 2021-05-18 LAB — BASIC METABOLIC PANEL
Anion gap: 7 (ref 5–15)
BUN: 10 mg/dL (ref 8–23)
CO2: 26 mmol/L (ref 22–32)
Calcium: 8.7 mg/dL — ABNORMAL LOW (ref 8.9–10.3)
Chloride: 100 mmol/L (ref 98–111)
Creatinine, Ser: 0.9 mg/dL (ref 0.44–1.00)
GFR, Estimated: >60 mL/min (ref >60)
Glucose, Bld: 186 mg/dL — ABNORMAL HIGH (ref 70–99)
Potassium: 4 mmol/L (ref 3.5–5.1)
Sodium: 133 mmol/L — ABNORMAL LOW (ref 135–145)

## 2021-05-18 LAB — CBC WITH DIFFERENTIAL/PLATELET
Abs Immature Granulocytes: 0.03 10*3/uL (ref 0.00–0.07)
Basophils Absolute: 0 10*3/uL (ref 0.0–0.1)
Basophils Relative: 1 %
Eosinophils Absolute: 0.3 10*3/uL (ref 0.0–0.5)
Eosinophils Relative: 6 %
HCT: 27.8 % — ABNORMAL LOW (ref 36.0–46.0)
Hemoglobin: 8.7 g/dL — ABNORMAL LOW (ref 12.0–15.0)
Immature Granulocytes: 1 %
Lymphocytes Relative: 20 %
Lymphs Abs: 1 10*3/uL (ref 0.7–4.0)
MCH: 29.8 pg (ref 26.0–34.0)
MCHC: 31.3 g/dL (ref 30.0–36.0)
MCV: 95.2 fL (ref 80.0–100.0)
Monocytes Absolute: 0.7 10*3/uL (ref 0.1–1.0)
Monocytes Relative: 14 %
Neutro Abs: 2.9 10*3/uL (ref 1.7–7.7)
Neutrophils Relative %: 58 %
Platelets: 322 10*3/uL (ref 150–400)
RBC: 2.92 MIL/uL — ABNORMAL LOW (ref 3.87–5.11)
RDW: 18.3 % — ABNORMAL HIGH (ref 11.5–15.5)
WBC: 4.9 10*3/uL (ref 4.0–10.5)
nRBC: 0 % (ref 0.0–0.2)

## 2021-05-18 LAB — GLUCOSE, CAPILLARY
Glucose-Capillary: 194 mg/dL — ABNORMAL HIGH (ref 70–99)
Glucose-Capillary: 251 mg/dL — ABNORMAL HIGH (ref 70–99)

## 2021-05-18 NOTE — Progress Notes (Signed)
Physical Therapy Treatment Patient Details Name: Katherine [name] MRN: [id number] DOB: 05-Oct-1955 Today's Date: 05/18/2021   History of Present Illness 65 yo female admitted 9/25 with syncope with Torsades de Pointes and bradycardia. PMhx: CAD, MI, CABG x3 (1995), A. fib on Eliquis and Tikosyn, HFpEF, HTN, HLD, PAD, ESRD, renal transplant in 1991, COPD on3L at home, depression, and T2DM    PT Comments    Attempted PT session 2x this morning with pt reporting she did not want to get up the first time until she had breakfast and the 2nd time because she had not had her meds yet. Pt reporting she understands how to ambulate and negotiate stairs with assistance safely at home and did not need to practice it today. However, she did express interest in creating a HEP with PT to address her lower extremity strength deficits, her L leg being weaker than her R. Created [facility] HEP to address deficits noted upon MMT this date, see below in General Comments. Reviewed and educated pt on it along with alternatives for safe progression. Will continue to follow acutely. Current recommendations remain appropriate.   Recommendations for follow up therapy are one component of a multi-disciplinary discharge planning process, led by the attending physician.  Recommendations may be updated based on patient status, additional functional criteria and insurance authorization.  Follow Up Recommendations  Home health PT;Supervision - Intermittent     Equipment Recommendations  None recommended by PT    Recommendations for Other Services       Precautions / Restrictions Precautions Precautions: Fall Precaution Comments: Pt reports h/o falls including 2 falls out of the bed Restrictions Weight Bearing Restrictions: No     Mobility  Bed Mobility               General bed mobility comments: Pt declined to get OOB, expressed desire to have a HEP created with PT though.    Transfers                  General transfer comment: Pt declined to get OOB, expressed desire to have a HEP created with PT though.  Ambulation/Gait             General Gait Details: Pt declined to get OOB, expressed desire to have a HEP created with PT though.   Stairs             Wheelchair Mobility    Modified Rankin (Stroke Patients Only)       Balance                                            Cognition Arousal/Alertness: Awake/alert Behavior During Therapy: WFL for tasks assessed/performed Overall Cognitive Status: Impaired/Different from baseline Area of Impairment: Memory                     Memory: Decreased short-term memory         General Comments: Pt appeared to forget already declining this PT earlier in AM due to not having breakfast yet and now declining to get OOB due to wanting to wait for meds. Pt agreeable to creating a HEP though with PT.      Exercises      General Comments General comments (skin integrity, edema, etc.): Bil UE strength WFL grossly 4+ to 5; L leg MMT hip flexion 3+, knee  extension 4, knee flexion 3, ankle dorsiflexion 4+; R leg MMT hip flexion 4, knee extension 5, knee flexion 4+, ankle dorsiflexion 5; Porvided pt with [facility] HEP Access Code: [place]rey handout with lower extremity exercises and options for progression, educated pt on all      Pertinent Vitals/Pain Pain Assessment: Faces Faces Pain Scale: Hurts a little bit Pain Location: L knee Pain Descriptors / Indicators: Grimacing;Guarding Pain Intervention(s): Limited activity within patient's tolerance;Monitored during session;Repositioned    Home Living                      Prior Function            PT Goals (current goals can now be found in the care plan section) Acute Rehab PT Goals Patient Stated Goal: pt agreeable to HEP education PT Goal Formulation: With patient Time For Goal Achievement: 05/27/21 Potential to Achieve Goals:  Good Progress towards PT goals: Progressing toward goals    Frequency    Min 3X/week      PT Plan Current plan remains appropriate    Co-evaluation              AM-PAC PT "6 Clicks" Mobility   Outcome Measure  Help needed turning from your back to your side while in a flat bed without using bedrails?: A Little Help needed moving from lying on your back to sitting on the side of a flat bed without using bedrails?: A Little Help needed moving to and from a bed to a chair (including a wheelchair)?: A Little Help needed standing up from a chair using your arms (e.g., wheelchair or bedside chair)?: A Little Help needed to walk in hospital room?: A Little Help needed climbing 3-5 steps with a railing? : A Lot 6 Click Score: 17    End of Session   Activity Tolerance: Patient tolerated treatment well Patient left: in bed;with call bell/phone within reach;with bed alarm set;with nursing/sitter in room   PT Visit Diagnosis: Other abnormalities of gait and mobility (R26.89);Repeated falls (R29.6);Muscle weakness (generalized) (M62.81)     Time: 6629-4765 PT Time Calculation (min) (ACUTE ONLY): 18 min  Charges:  $Therapeutic Exercise: 8-22 mins                     Moishe Spice, PT, DPT Acute Rehabilitation Services  Pager: [phone] Office: [place] 05/18/2021, 10:57 AM

## 2021-05-18 NOTE — Discharge Summary (Signed)
Physician Discharge Summary  Katherine [name] YSA:[id number] DOB: [birth year] DOA: 05/11/2021  PCP: [name], MD  Admit date: 05/11/2021 Discharge date: 05/18/2021  Admitted From: Home Home Disposition: Home with home health  Recommendations for Outpatient Follow-up:  Follow up with PCP in 1-2 weeks Please obtain BMP/CBC in one week Cardiology will schedule follow-up  Home Health: PT/OT Equipment/Devices: Oxygen available at home  Discharge Condition: Stable CODE STATUS: Full code Diet recommendation: Low-salt low-carb diet  Discharge summary: 65 year old female with history of coronary artery disease status post CABG, persistent A. fib on Eliquis and Tikosyn, heart failure with preserved ejection fraction, hypertension, hyperlipidemia, peripheral artery disease, renal transplant, COPD and chronic hypoxemic respiratory failure on 3 L oxygen at home, depression and type 2 diabetes presented to the emergency room with syncope and palpitations.  She was noted to have passed out with her brother at the bedside.  EMS was called and found patient with [name] and ventricular bigeminy rhythm.  Patient complained of chest pain and shortness of breath on arrival.  On arrival to hospital, EKG strip showed polymorphic [name]/torsade.  Cardiology consulted.  Treated with magnesium and dopamine for mild bradycardia and admitted to ICU.  Troponins were negative.  EKG with QTC of 565 and heart rate of 50/min. Admitted to ICU and stabilized.  Transiently treated with dopamine for bradycardia.   # Prolonged QT with torsades de pointes: Probably contributed by multiple medications. Treated with magnesium with improvement.  QT normalized. As per cardiology recommendation, keeping potassium more than 4, magnesium more than 2. Discontinued all QT prolonging medications.   Resumed patient's Cymbalta and Lamictal.  Discontinued Zoloft and Phenergan.   Paroxysmal A. fib: Currently sinus rhythm.  Started  back on beta-blockers.  Therapeutic on Eliquis.  Atypical chest pain: Acute coronary syndrome ruled out.  Does have history of coronary artery disease.  She is on Plavix.  She was prescribed Plavix along with anticoagulation with Eliquis.   AKI with history of ESRD status postrenal transplant: Stable.  On prednisone and azathioprine.  Electrolytes adequate.  Renal functions are stable.  COPD with chronic hypoxemic respiratory failure: On 3 L oxygen at home.  Continue Pulmicort and Brovana.  Duo nebs as needed.  Oxygen to keep saturations more than 90%.   She was prescribed CPAP at night, however does not use it.  Type 2 diabetes: On glimepiride and insulin at home.  Currently remains on insulin.  Stable.  Resume home medications today.  History of depression and fibromyalgia: On multiple medications. Continue home doses of Ativan. QTC improved.  Tikosyn discontinued. Started on Cymbalta and Lamictal.  Ativan as home doses.     Febrile episode: Developed low-grade fever and myalgia.  Urinalysis abnormal.  Cultures negative so far.  Treated with 3 days of Rocephin.  Asymptomatic today.   Medically stabilized.  Asymptomatic today.  Able to go home.  She is going to her brother's house.  She will benefit with PT OT at home.  Discharge Diagnoses:  Principal Problem:   Torsades de pointes Active Problems:   Paroxysmal atrial fibrillation ([place]); CHA2DS2VASc score F, HTN, CAD, CVA = 5   Type 2 diabetes mellitus with peripheral neuropathy (HCC)   Polymorphic ventricular tachycardia   COPD (chronic obstructive pulmonary disease) (HCC)    Discharge Instructions  Discharge Instructions     Diet - low sodium heart healthy   Complete by: As directed    Increase activity slowly   Complete by: As directed  No wound care   Complete by: As directed       Allergies as of 05/18/2021       Reactions   Tetracycline Hives   Patient tolerated Doxycycline Dec 2020   Niacin Other (See  Comments)   Mouth blisters   Niaspan [niacin Er] Other (See Comments)   Mouth blisters   Sulfa Antibiotics Nausea Only, Other (See Comments)   "Tears up stomach"   Sulfonamide Derivatives Other (See Comments)   Reaction: per patient "tears her stomach up"   Codeine Nausea And Vomiting   Erythromycin Nausea And Vomiting   Hydromorphone Hcl Nausea And Vomiting   Morphine And Related Nausea And Vomiting   Nalbuphine Nausea And Vomiting   Nubain   Sulfasalazine Nausea Only, Other (See Comments)   per patient "tears her stomach up", "Tears up stomach"   Tape Rash, Other (See Comments)   No "plastic" tape," please----cloth tape only        Medication List     STOP taking these medications    diclofenac Sodium 1 % Gel Commonly known as: VOLTAREN   diltiazem 30 MG tablet Commonly known as: Cardizem   dofetilide 250 MCG capsule Commonly known as: TIKOSYN   furosemide 40 MG tablet Commonly known as: LASIX   phenazopyridine 100 MG tablet Commonly known as: PYRIDIUM   promethazine 25 MG tablet Commonly known as: PHENERGAN   sertraline 100 MG tablet Commonly known as: ZOLOFT       TAKE these medications    acetaminophen 500 MG tablet Commonly known as: TYLENOL Take 1,500 mg by mouth 2 (two) times daily.   albuterol 108 (90 Base) MCG/ACT inhaler Commonly known as: VENTOLIN HFA TAKE 2 PUFFS BY MOUTH EVERY 6 HOURS AS NEEDED FOR WHEEZE OR SHORTNESS OF BREATH What changed: See the new instructions.   apixaban 5 MG Tabs tablet Commonly known as: Eliquis Take 1 tablet (5 mg total) by mouth 2 (two) times daily. APPOINTMENT NEEDED WITH CARDIOLOGIST FOR FURTHER REFILLS What changed: additional instructions   arformoterol 15 MCG/2ML Nebu Commonly known as: BROVANA Take 2 mLs (15 mcg total) by nebulization 2 (two) times daily.   azaTHIOprine 50 MG tablet Commonly known as: IMURAN Take 125 mg by mouth See admin instructions. Take 2 1/2 tablets (125 mg) by mouth daily at  3pm   Basaglar KwikPen 100 UNIT/ML Inject 12 Units into the skin daily before breakfast.   budesonide 0.5 MG/2ML nebulizer solution Commonly known as: PULMICORT Take 2 mLs (0.5 mg total) by nebulization 2 (two) times daily.   calcitRIOL 0.25 MCG capsule Commonly known as: ROCALTROL Take 0.25 mcg by mouth every 3 (three) days.   cetirizine 5 MG tablet Commonly known as: ZYRTEC Take 5 mg by mouth daily.   clopidogrel 75 MG tablet Commonly known as: PLAVIX Take 1 tablet (75 mg total) by mouth daily.   clotrimazole 10 MG troche Commonly known as: MYCELEX Take 1 tablet (10 mg total) by mouth 5 (five) times daily as needed (thrush).   cyclobenzaprine 10 MG tablet Commonly known as: FLEXERIL TAKE 2 TAB DAILY AT BEDTIME, MAY ALSO TAKE 1 TABLET BY MOUTH AT NOON AS NEEDED FOR MUSCLE SPASMS What changed: See the new instructions.   DULoxetine 30 MG capsule Commonly known as: CYMBALTA TAKE 2 CAPSULES (60 MG TOTAL) BY MOUTH EVERY MORNING. What changed: See the new instructions.   fluticasone 50 MCG/ACT nasal spray Commonly known as: FLONASE PLACE 2 SPRAYS INTO BOTH NOSTRILS 2 TIMES DAILY What changed:  how much to take how to take this when to take this additional instructions   glimepiride 4 MG tablet Commonly known as: AMARYL Take 4 mg by mouth daily.   isosorbide mononitrate 60 MG 24 hr tablet Commonly known as: IMDUR Take 1 tablet (60 mg total) by mouth daily.   lamoTRIgine 25 MG tablet Commonly known as: LAMICTAL TAKE 1 TABLET BY MOUTH EVERYDAY AT BEDTIME What changed: See the new instructions.   LORazepam 1 MG tablet Commonly known as: ATIVAN Take 1 tablet (1 mg total) by mouth 2 (two) times daily.   magnesium oxide 400 (240 Mg) MG tablet Commonly known as: MAG-OX Take 1 tablet (400 mg total) by mouth daily.   metFORMIN 500 MG tablet Commonly known as: GLUCOPHAGE Take 1 tablet (500 mg total) by mouth 2 (two) times daily.   metoprolol tartrate 100 MG  tablet Commonly known as: LOPRESSOR Take 1 tablet (100 mg total) by mouth 2 (two) times daily. Appointment Required For Further Refills [phone] What changed: additional instructions   montelukast 10 MG tablet Commonly known as: SINGULAIR TAKE 1 TABLET BY MOUTH EVERYDAY AT BEDTIME What changed: See the new instructions.   nitroGLYCERIN 0.4 MG SL tablet Commonly known as: NITROSTAT Place 1 tablet (0.4 mg total) under the tongue every 5 (five) minutes as needed for chest pain.   NovoLOG FlexPen 100 UNIT/ML FlexPen Generic drug: insulin aspart Inject 14 Units into the skin 3 (three) times daily with meals.   Oxycodone HCl 10 MG Tabs Take 1 tablet (10 mg total) by mouth 4 (four) times daily as needed. What changed: reasons to take this   OXYGEN Inhale 3 L into the lungs continuous.   pantoprazole 40 MG tablet Commonly known as: PROTONIX TAKE 1 TABLET BY MOUTH EVERY DAY   predniSONE 5 MG tablet Commonly known as: DELTASONE Take 5 mg by mouth every morning.   rosuvastatin 40 MG tablet Commonly known as: CRESTOR Take 1 tablet (40 mg total) by mouth daily. What changed: how much to take        Follow-up Information     [facility] Follow up.   Specialty: Home Health Services Why: Registered Nurse, Physical Therapy, Social Worker-Office to call with visit times. Contact information: [address] [facility] [place] [address] [phone]         [name], MD Follow up.   Specialty: Cardiology Why: [facility] - Dr. Allison Quarry office will call you to arrange a follow-up visit. Contact information: [place] [address] [place] [address] [phone]                Allergies  Allergen Reactions   Tetracycline Hives    Patient tolerated Doxycycline Dec 2020   Niacin Other (See Comments)    Mouth blisters   Niaspan [Niacin Er] Other (See Comments)    Mouth blisters   Sulfa Antibiotics Nausea Only and Other  (See Comments)    "Tears up stomach"   Sulfonamide Derivatives Other (See Comments)    Reaction: per patient "tears her stomach up"   Codeine Nausea And Vomiting   Erythromycin Nausea And Vomiting   Hydromorphone Hcl Nausea And Vomiting   Morphine And Related Nausea And Vomiting   Nalbuphine Nausea And Vomiting    Nubain   Sulfasalazine Nausea Only and Other (See Comments)    per patient "tears her stomach up", "Tears up stomach"   Tape Rash and Other (See Comments)    No "plastic"  tape," please----cloth tape only    Consultations: Cardiology Critical care   Procedures/Studies: DG Chest 2 View  Result Date: 04/19/2021 CLINICAL DATA:  Left-sided chest pain for 1 week. Shortness of breath. History of COPD. Patient fell 1 week ago. EXAM: CHEST - 2 VIEW COMPARISON:  Grafts 04/12/2021.  CT 08/23/2019. FINDINGS: Stable mild cardiomegaly post median sternotomy and CABG. There are mitral annular and aortic calcifications. The lungs appear clear. There is no pleural effusion or pneumothorax. No acute fractures are seen. Several sternotomy wires are chronically fractured. IMPRESSION: Stable chest.  No evidence of acute cardiopulmonary process. Electronically Signed   By: Richardean Sale M.D.   On: 04/19/2021 10:39   CT ABDOMEN PELVIS W CONTRAST  Result Date: 04/19/2021 CLINICAL DATA:  Abdominal pain and nausea. EXAM: CT ABDOMEN AND PELVIS WITH CONTRAST TECHNIQUE: Multidetector CT imaging of the abdomen and pelvis was performed using the standard protocol following bolus administration of intravenous contrast. CONTRAST:  22mL OMNIPAQUE IOHEXOL 350 MG/ML SOLN COMPARISON:  Noncontrast CT 07/28/2014 FINDINGS: Lower chest: Heterogeneous pulmonary parenchyma with areas of subpleural ground-glass opacity in the right lower lobe. Similar findings seen on 08/23/2019 chest CT. Mild cardiomegaly with coronary artery calcifications no pleural fluid. Hepatobiliary: Calcified granuloma in the right lobe of the  liver. Diffusely decreased hepatic density typical of steatosis. 19 mm cyst abutting the porta hepatis. Additional low-density abutting the falciform ligament, possible focal fatty infiltration but nonspecific. No suspicious liver lesion. Cholecystectomy without biliary dilatation. Pancreas: Fatty atrophy.  No ductal dilatation or inflammation. Spleen: Normal in size.  Occasional calcified granuloma. Adrenals/Urinary Tract: Normal adrenal glands. Marked bilateral native renal parenchymal atrophy. Calcifications at both renal hila may be nonobstructing stones or vascular. Left lower quadrant transplant kidney. No transplant hydronephrosis or perinephric edema. There are tiny cortical hypodensities in the transplant that likely represent cysts but are too small to characterize. Transplanted ureter is nondistended. Urinary bladder is partially distended. There is bladder wall thickening about the dome. Similar wall thickening was seen on 2015 exam. Stomach/Bowel: Gastric band in place. Stomach is decompressed. No small bowel obstruction or inflammation. Mild sigmoid colonic diverticulosis without diverticulitis. Small volume of colonic stool. Normal appendix. Vascular/Lymphatic: Advanced aortic and branch atherosclerosis with calcified and noncalcified atheromatous plaque. No aneurysm or acute vascular finding. Chunky plaque at the iliac 1 bifurcations likely cause some degree of luminal stenosis, not well assessed on this non CTA exam. Patent portal vein. No enlarged lymph nodes in the abdomen or pelvis. Reproductive: Atrophic uterus.  No adnexal mass. Other: No free air, free fluid, or intra-abdominal fluid collection. Musculoskeletal: There are no acute or suspicious osseous abnormalities. Degenerative change throughout the spine. Bilateral hip osteoarthritis, moderately advanced on the left. Scattered bone islands. IMPRESSION: 1. No acute abnormality in the abdomen/pelvis. 2. Left lower quadrant transplant kidney  without transplant hydronephrosis or perinephric edema. 3. Mild bladder wall thickening about the dome, similar to 2015 exam, and favored to be chronic. Recommend urinalysis to exclude urinary tract infection. 4. Hepatic steatosis. 5. Colonic diverticulosis without diverticulitis. 6. Heterogeneous pulmonary parenchyma in the lung bases suggestive of small vessel or small airways disease. Areas of subpleural ground-glass opacity in the right lower lobe are not significantly changed from January 2021 chest CT. Aortic Atherosclerosis (ICD10-I70.0). Electronically Signed   By: Keith Rake M.D.   On: 04/19/2021 15:25   DG CHEST PORT 1 VIEW  Result Date: 05/15/2021 CLINICAL DATA:  Fever EXAM: PORTABLE CHEST 1 VIEW COMPARISON:  05/12/2021 FINDINGS: Prior CABG. Cardiomegaly. No  confluent airspace opacities, effusions or edema. No acute bony abnormality. IMPRESSION: Cardiomegaly. No active disease. Electronically Signed   By: Rolm Baptise M.D.   On: 05/15/2021 10:20   DG Chest Portable 1 View  Result Date: 05/12/2021 CLINICAL DATA:  Chest pain EXAM: PORTABLE CHEST 1 VIEW COMPARISON:  04/19/2021 FINDINGS: Lungs are clear. No pneumothorax or pleural effusion. Coronary artery bypass grafting has been performed. Cardiac size is mildly enlarged, unchanged. No acute bone abnormality. IMPRESSION: No active disease.  Stable cardiomegaly. Electronically Signed   By: Fidela Salisbury M.D.   On: 05/12/2021 00:40   (Echo, Carotid, EGD, Colonoscopy, ERCP)    Subjective: Patient seen and examined.  No overnight events.  Afebrile.  Today she denies any complaints.  She is ready to go home.   Discharge Exam: Vitals:   05/18/21 1029 05/18/21 1200  BP: (!) 151/71 123/68  Pulse: 71 67  Resp:  (!) 22  Temp:  98.7 F (37.1 C)  SpO2:  90%   Vitals:   05/18/21 0700 05/18/21 0747 05/18/21 1029 05/18/21 1200  BP: (!) 161/68  (!) 151/71 123/68  Pulse: 65  71 67  Resp: 18   (!) 22  Temp: 98.6 F (37 C)   98.7 F (37.1  C)  TempSrc: Oral   Oral  SpO2: 95% 98%  90%  Weight:      Height:        General: Pt is alert, awake, not in acute distress Frail and debilitated.  On 3 L oxygen.  Not in any distress. Cardiovascular: RRR, S1/S2 +, no rubs, no gallops Respiratory: CTA bilaterally, no wheezing, no rhonchi Abdominal: Soft, NT, ND, bowel sounds + Extremities: no edema, no cyanosis    The results of significant diagnostics from this hospitalization (including imaging, microbiology, ancillary and laboratory) are listed below for reference.     Microbiology: Recent Results (from the past 240 hour(s))  Resp Panel by RT-PCR (Flu A&B, Covid) Nasopharyngeal Swab     Status: None   Collection Time: 05/12/21 12:22 AM   Specimen: Nasopharyngeal Swab; Nasopharyngeal(NP) swabs in vial transport medium  Result Value Ref Range Status   SARS Coronavirus 2 by RT PCR NEGATIVE NEGATIVE Final    Comment: (NOTE) SARS-CoV-2 target nucleic acids are NOT DETECTED.  The SARS-CoV-2 RNA is generally detectable in upper respiratory specimens during the acute phase of infection. The lowest concentration of SARS-CoV-2 viral copies this assay can detect is 138 copies/mL. A negative result does not preclude SARS-Cov-2 infection and should not be used as the sole basis for treatment or other patient management decisions. A negative result may occur with  improper specimen collection/handling, submission of specimen other than nasopharyngeal swab, presence of viral mutation(s) within the areas targeted by this assay, and inadequate number of viral copies(<138 copies/mL). A negative result must be combined with clinical observations, patient history, and epidemiological information. The expected result is Negative.  Fact Sheet for Patients:  EntrepreneurPulse.com.au  Fact Sheet for Healthcare Providers:  IncredibleEmployment.be  This test is no t yet approved or cleared by the [place] FDA and  has been authorized for detection and/or diagnosis of SARS-CoV-2 by FDA under an Emergency Use Authorization (EUA). This EUA will remain  in effect (meaning this test can be used) for the duration of the COVID-19 declaration under Section 564(b)(1) of the Act, 21 U.S.C.section 360bbb-3(b)(1), unless the authorization is terminated  or revoked sooner.       Influenza A by PCR NEGATIVE NEGATIVE Final  Influenza B by PCR NEGATIVE NEGATIVE Final    Comment: (NOTE) The Xpert Xpress SARS-CoV-2/FLU/RSV plus assay is intended as an aid in the diagnosis of influenza from Nasopharyngeal swab specimens and should not be used as a sole basis for treatment. Nasal washings and aspirates are unacceptable for Xpert Xpress SARS-CoV-2/FLU/RSV testing.  Fact Sheet for Patients: EntrepreneurPulse.com.au  Fact Sheet for Healthcare Providers: IncredibleEmployment.be  This test is not yet approved or cleared by the [place] FDA and has been authorized for detection and/or diagnosis of SARS-CoV-2 by FDA under an Emergency Use Authorization (EUA). This EUA will remain in effect (meaning this test can be used) for the duration of the COVID-19 declaration under Section 564(b)(1) of the Act, 21 U.S.C. section 360bbb-3(b)(1), unless the authorization is terminated or revoked.  Performed at [facility], [place] [address], [place], [place] [address]   MRSA Next Gen by PCR, Nasal     Status: None   Collection Time: 05/12/21  3:53 AM   Specimen: Nasal Mucosa; Nasal Swab  Result Value Ref Range Status   MRSA by PCR Next Gen NOT DETECTED NOT DETECTED Final    Comment: (NOTE) The GeneXpert MRSA Assay (FDA approved for NASAL specimens only), is one component of a comprehensive MRSA colonization surveillance program. It is not intended to diagnose MRSA infection nor to guide or monitor treatment for MRSA infections. Test performance is not FDA  approved in patients less than 53 years old. Performed at [facility], [place] [address] [place], [place] [address]   Culture, blood (routine x 2)     Status: None (Preliminary result)   Collection Time: 05/15/21  9:59 AM   Specimen: BLOOD  Result Value Ref Range Status   Specimen Description BLOOD RIGHT ANTECUBITAL  Final   Special Requests   Final    BOTTLES DRAWN AEROBIC AND ANAEROBIC Blood Culture adequate volume   Culture   Final    NO GROWTH 3 DAYS Performed at [facility], [place] [address], [place], [place] [address]    Report Status PENDING  Incomplete  Culture, blood (routine x 2)     Status: None (Preliminary result)   Collection Time: 05/15/21 10:03 AM   Specimen: BLOOD  Result Value Ref Range Status   Specimen Description BLOOD RIGHT ANTECUBITAL  Final   Special Requests   Final    BOTTLES DRAWN AEROBIC AND ANAEROBIC Blood Culture adequate volume   Culture   Final    NO GROWTH 3 DAYS Performed at [facility], [place] [address], [place], [place] [address]    Report Status PENDING  Incomplete     Labs: BNP (last 3 results) Recent Labs    07/11/20 0750 10/30/20 1949 01/29/21 0030  BNP 234.9* 300.3* 867.6*   Basic Metabolic Panel: Recent Labs  Lab 05/12/21 0022 05/12/21 0233 05/12/21 0706 05/13/21 0533 05/13/21 1042 05/14/21 0034 05/16/21 0035 05/18/21 0042  NA 133*   < > 130* 139 134* 132* 134* 133*  K 4.4   < > 4.0 3.1* 4.2 4.7 3.7 4.0  CL 103  --  100 106 102 104 101 100  CO2 21*  --  20* 24 21* 21* 25 26  GLUCOSE 264*  --  273* 92 177* 238* 114* 186*  BUN 14  --  17 13 14 14 9 10   CREATININE 1.29*  --  1.44* 1.13* 1.16* 1.20* 1.00 0.90  CALCIUM 8.7*  --  9.2 8.8* 8.4* 8.5* 8.7*  8.7*  MG 1.8  --  3.4* 2.4  --  2.1 2.0  --   PHOS  --   --  2.8  --   --   --  3.6  --    < > = values in this interval not displayed.   Liver Function Tests: Recent Labs  Lab 05/12/21 0706 05/13/21 0533  AST 24 14*  ALT 8 10  ALKPHOS 121 94   BILITOT 1.0 0.6  PROT 7.0 5.9*  ALBUMIN 2.5* 2.1*   No results for input(s): LIPASE, AMYLASE in the last 168 hours. Recent Labs  Lab 05/12/21 1516  AMMONIA 22   CBC: Recent Labs  Lab 05/13/21 0533 05/14/21 0034 05/16/21 0035 05/17/21 0026 05/18/21 0042  WBC 5.5 4.4 4.5 4.7 4.9  NEUTROABS  --   --  2.5 2.8 2.9  HGB 8.8* 8.5* 9.4* 9.0* 8.7*  HCT 27.5* 27.0* 29.9* 28.5* 27.8*  MCV 94.5 96.4 95.2 95.0 95.2  PLT 250 222 277 286 322   Cardiac Enzymes: No results for input(s): CKTOTAL, CKMB, CKMBINDEX, TROPONINI in the last 168 hours. BNP: Invalid input(s): POCBNP CBG: Recent Labs  Lab 05/17/21 1119 05/17/21 1638 05/17/21 2113 05/18/21 0640 05/18/21 1137  GLUCAP 172* 369* 259* 194* 251*   D-Dimer No results for input(s): DDIMER in the last 72 hours. Hgb A1c No results for input(s): HGBA1C in the last 72 hours. Lipid Profile No results for input(s): CHOL, HDL, LDLCALC, TRIG, CHOLHDL, LDLDIRECT in the last 72 hours. Thyroid function studies No results for input(s): TSH, T4TOTAL, T3FREE, THYROIDAB in the last 72 hours.  Invalid input(s): FREET3 Anemia work up No results for input(s): VITAMINB12, FOLATE, FERRITIN, TIBC, IRON, RETICCTPCT in the last 72 hours. Urinalysis    Component Value Date/Time   COLORURINE YELLOW 05/15/2021 1433   APPEARANCEUR HAZY (A) 05/15/2021 1433   LABSPEC 1.004 (L) 05/15/2021 1433   PHURINE 7.0 05/15/2021 1433   GLUCOSEU 50 (A) 05/15/2021 1433   GLUCOSEU 100 (A) 08/13/2020 1550   HGBUR SMALL (A) 05/15/2021 1433   BILIRUBINUR NEGATIVE 05/15/2021 1433   KETONESUR NEGATIVE 05/15/2021 1433   PROTEINUR NEGATIVE 05/15/2021 1433   UROBILINOGEN 0.2 08/13/2020 1550   NITRITE NEGATIVE 05/15/2021 1433   LEUKOCYTESUR LARGE (A) 05/15/2021 1433   Sepsis Labs Invalid input(s): PROCALCITONIN,  WBC,  LACTICIDVEN Microbiology Recent Results (from the past 240 hour(s))  Resp Panel by RT-PCR (Flu A&B, Covid) Nasopharyngeal Swab     Status: None    Collection Time: 05/12/21 12:22 AM   Specimen: Nasopharyngeal Swab; Nasopharyngeal(NP) swabs in vial transport medium  Result Value Ref Range Status   SARS Coronavirus 2 by RT PCR NEGATIVE NEGATIVE Final    Comment: (NOTE) SARS-CoV-2 target nucleic acids are NOT DETECTED.  The SARS-CoV-2 RNA is generally detectable in upper respiratory specimens during the acute phase of infection. The lowest concentration of SARS-CoV-2 viral copies this assay can detect is 138 copies/mL. A negative result does not preclude SARS-Cov-2 infection and should not be used as the sole basis for treatment or other patient management decisions. A negative result may occur with  improper specimen collection/handling, submission of specimen other than nasopharyngeal swab, presence of viral mutation(s) within the areas targeted by this assay, and inadequate number of viral copies(<138 copies/mL). A negative result must be combined with clinical observations, patient history, and epidemiological information. The expected result is Negative.  Fact Sheet for Patients:  EntrepreneurPulse.com.au  Fact Sheet for Healthcare Providers:  [employer]  This test is no t  yet approved or cleared by the [place] and  has been authorized for detection and/or diagnosis of SARS-CoV-2 by FDA under an Emergency Use Authorization (EUA). This EUA will remain  in effect (meaning this test can be used) for the duration of the COVID-19 declaration under Section 564(b)(1) of the Act, 21 U.S.C.section 360bbb-3(b)(1), unless the authorization is terminated  or revoked sooner.       Influenza A by PCR NEGATIVE NEGATIVE Final   Influenza B by PCR NEGATIVE NEGATIVE Final    Comment: (NOTE) The Xpert Xpress SARS-CoV-2/FLU/RSV plus assay is intended as an aid in the diagnosis of influenza from Nasopharyngeal swab specimens and should not be used as a sole basis for treatment.  Nasal washings and aspirates are unacceptable for Xpert Xpress SARS-CoV-2/FLU/RSV testing.  Fact Sheet for Patients: EntrepreneurPulse.com.au  Fact Sheet for Healthcare Providers: IncredibleEmployment[employer]be  This test is not yet approved or cleared by the [place] FDA and has been authorized for detection and/or diagnosis of SARS-CoV-2 by FDA under an Emergency Use Authorization (EUA). This EUA will remain in effect (meaning this test can be used) for the duration of the COVID-19 declaration under Section 564(b)(1) of the Act, 21 U.S.C. section 360bbb-3(b)(1), unless the authorization is terminated or revoked.  Performed at [facility], [place] [address] [place], [place] [address]   MRSA Next Gen by PCR, Nasal     Status: None   Collection Time: 05/12/21  3:53 AM   Specimen: Nasal Mucosa; Nasal Swab  Result Value Ref Range Status   MRSA by PCR Next Gen NOT DETECTED NOT DETECTED Final    Comment: (NOTE) The GeneXpert MRSA Assay (FDA approved for NASAL specimens only), is one component of a comprehensive MRSA colonization surveillance program. It is not intended to diagnose MRSA infection nor to guide or monitor treatment for MRSA infections. Test performance is not FDA approved in patients less than 12 years old. Performed at [facility], [place] [address] [place], [place] [address]   Culture, blood (routine x 2)     Status: None (Preliminary result)   Collection Time: 05/15/21  9:59 AM   Specimen: BLOOD  Result Value Ref Range Status   Specimen Description BLOOD RIGHT ANTECUBITAL  Final   Special Requests   Final    BOTTLES DRAWN AEROBIC AND ANAEROBIC Blood Culture adequate volume   Culture   Final    NO GROWTH 3 DAYS Performed at [facility], [place] [address], [place], [place] [address]    Report Status PENDING  Incomplete  Culture, blood (routine x 2)     Status: None (Preliminary result)   Collection Time: 05/15/21  10:03 AM   Specimen: BLOOD  Result Value Ref Range Status   Specimen Description BLOOD RIGHT ANTECUBITAL  Final   Special Requests   Final    BOTTLES DRAWN AEROBIC AND ANAEROBIC Blood Culture adequate volume   Culture   Final    NO GROWTH 3 DAYS Performed at [facility], [place] [address] [place], [place] [address]    Report Status PENDING  Incomplete     Time coordinating discharge:  32 minutes  SIGNED:   Barb Merino, MD  [facility] 05/18/2021, 12:50 PM

## 2021-05-19 ENCOUNTER — Telehealth: Payer: Self-pay

## 2021-05-19 NOTE — Telephone Encounter (Signed)
Transition Care Management Unsuccessful Follow-up Telephone Call  Date of discharge and from where:  05/18/2021 from [facility]  Attempts:  1st Attempt  Reason for unsuccessful TCM follow-up call:  Spoke with patient, not able to schedule for this week.  Per Dr. Ronnald Ramp, schedule for next week. Will call patient back 05/19/2021.

## 2021-05-20 ENCOUNTER — Telehealth: Payer: Self-pay

## 2021-05-20 LAB — CULTURE, BLOOD (ROUTINE X 2)
Culture: NO GROWTH
Culture: NO GROWTH
Special Requests: ADEQUATE
Special Requests: ADEQUATE

## 2021-05-20 NOTE — Chronic Care Management (AMB) (Signed)
Chronic Care Management Pharmacy Assistant   Name: Vannesa Abair  MRN: [id number] DOB: 11-28-1955  Anae Hams is an 65 y.o. year old female who presents for his follow-up CCM visit with the clinical pharmacist.  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 Discharge date was 05/18/21. Discharged from [facility]?Medications Started at [facility] Discharge:?? -started none  Medication Changes at Hospital Discharge: -Changed none  Medications Discontinued at Hospital Discharge: -Stopped diclofenac Sodium 1 % Gel (VOLTAREN) diltiazem 30 MG tablet (Cardizem) dofetilide 250 MCG capsule (TIKOSYN) furosemide 40 MG tablet (LASIX) phenazopyridine 100 MG tablet (PYRIDIUM) promethazine 25 MG tablet (PHENERGAN) sertraline 100 MG tablet (ZOLOFT)  Medications that remain the same after Hospital Discharge:??  -All other medications will remain the same.    Medications: Outpatient Encounter Medications as of 05/20/2021  Medication Sig   pantoprazole (PROTONIX) 40 MG tablet TAKE 1 TABLET BY MOUTH EVERY DAY   acetaminophen (TYLENOL) 500 MG tablet Take 1,500 mg by mouth 2 (two) times daily.   albuterol (VENTOLIN HFA) 108 (90 Base) MCG/ACT inhaler TAKE 2 PUFFS BY MOUTH EVERY 6 HOURS AS NEEDED FOR WHEEZE OR SHORTNESS OF BREATH (Patient taking differently: Inhale 2 puffs into the lungs every 6 (six) hours as needed for wheezing or shortness of breath.)   apixaban (ELIQUIS) 5 MG TABS tablet Take 1 tablet (5 mg total) by mouth 2 (two) times daily. APPOINTMENT NEEDED WITH CARDIOLOGIST FOR FURTHER REFILLS (Patient taking differently: Take 5 mg by mouth 2 (two) times daily.)   arformoterol (BROVANA) 15  MCG/2ML NEBU Take 2 mLs (15 mcg total) by nebulization 2 (two) times daily.   azaTHIOprine (IMURAN) 50 MG tablet Take 125 mg by mouth See admin instructions. Take 2 1/2 tablets (125 mg) by mouth daily at 3pm   budesonide (PULMICORT) 0.5 MG/2ML nebulizer solution Take 2 mLs (0.5 mg total) by nebulization 2 (two) times daily.   calcitRIOL (ROCALTROL) 0.25 MCG capsule Take 0.25 mcg by mouth every 3 (three) days.    cetirizine (ZYRTEC) 5 MG tablet Take 5 mg by mouth daily.   clopidogrel (PLAVIX) 75 MG tablet Take 1 tablet (75 mg total) by mouth daily.   clotrimazole (MYCELEX) 10 MG troche Take 1 tablet (10 mg total) by mouth 5 (five) times daily as needed (thrush).   cyclobenzaprine (FLEXERIL) 10 MG tablet TAKE 2 TAB DAILY AT BEDTIME, MAY ALSO TAKE 1 TABLET BY MOUTH AT NOON AS NEEDED FOR MUSCLE SPASMS (Patient taking differently: Take 5 mg by mouth 2 (two) times daily as needed for muscle spasms.)   DULoxetine (CYMBALTA) 30 MG capsule TAKE 2 CAPSULES (60 MG TOTAL) BY MOUTH EVERY MORNING. (Patient taking differently: Take 60 mg by mouth daily.)   fluticasone (FLONASE) 50 MCG/ACT nasal spray PLACE 2 SPRAYS INTO BOTH NOSTRILS 2 TIMES DAILY (Patient taking differently: Place 2 sprays into both nostrils 2 (two) times daily.)   glimepiride (AMARYL) 4 MG tablet Take 4 mg by mouth daily.   insulin aspart (NOVOLOG FLEXPEN) 100 UNIT/ML FlexPen Inject 14 Units into the skin 3 (three) times daily with meals.   Insulin Glargine (BASAGLAR KWIKPEN) 100 UNIT/ML SOPN Inject 12 Units into the skin daily before breakfast.   isosorbide mononitrate (IMDUR) 60 MG 24 hr  tablet Take 1 tablet (60 mg total) by mouth daily.   lamoTRIgine (LAMICTAL) 25 MG tablet TAKE 1 TABLET BY MOUTH EVERYDAY AT BEDTIME (Patient taking differently: Take 25 mg by mouth at bedtime.)   LORazepam (ATIVAN) 1 MG tablet Take 1 tablet (1 mg total) by mouth 2 (two) times daily.   magnesium oxide (MAG-OX) 400 (240 Mg) MG tablet Take 1 tablet (400 mg total)  by mouth daily. (Patient not taking: No sig reported)   metFORMIN (GLUCOPHAGE) 500 MG tablet Take 1 tablet (500 mg total) by mouth 2 (two) times daily.   metoprolol tartrate (LOPRESSOR) 100 MG tablet Take 1 tablet (100 mg total) by mouth 2 (two) times daily. Appointment Required For Further Refills [phone] (Patient taking differently: Take 100 mg by mouth 2 (two) times daily.)   montelukast (SINGULAIR) 10 MG tablet TAKE 1 TABLET BY MOUTH EVERYDAY AT BEDTIME (Patient taking differently: Take 10 mg by mouth at bedtime.)   nitroGLYCERIN (NITROSTAT) 0.4 MG SL tablet Place 1 tablet (0.4 mg total) under the tongue every 5 (five) minutes as needed for chest pain.   Oxycodone HCl 10 MG TABS Take 1 tablet (10 mg total) by mouth 4 (four) times daily as needed. (Patient taking differently: Take 10 mg by mouth 4 (four) times daily as needed (pain).)   OXYGEN Inhale 3 L into the lungs continuous.   predniSONE (DELTASONE) 5 MG tablet Take 5 mg by mouth every morning.   rosuvastatin (CRESTOR) 40 MG tablet Take 1 tablet (40 mg total) by mouth daily. (Patient taking differently: Take 20 mg by mouth daily.)   No facility-administered encounter medications on file as of 05/20/2021.   Have you had any problems recently with your health?  Have you had any problems with your pharmacy?  What issues or side effects are you having with your medications?  What would you like me to pass along to Lois Huxley, CPP for them to help you with?   What can we do to take care of you better?   Star Rating Drugs: rosuvastatin (Crestor) 20 mg last filled   [place] Pharmacist Assistant [phone]

## 2021-05-20 NOTE — Telephone Encounter (Signed)
Transition Care Management Follow-up Telephone Call Date of discharge and from where: 05/18/2021 from [facility] How have you been since you were released from the hospital? Very weak Any questions or concerns? No  Items Reviewed: Did the pt receive and understand the discharge instructions provided? Yes  Medications obtained and verified? Yes  Other? No  Any new allergies since your discharge? No  Dietary orders reviewed? Yes; low salt low carb diet Do you have support at home? Yes ; lives with brother  Home Care and Equipment/Supplies: Were home health services ordered? yes If so, what is the name of the agency? For PT & OT  Has the agency set up a time to come to the patient's home? yes Were any new equipment or medical supplies ordered?  Yes: oxygen 3L/Minute What is the name of the medical supply agency? N/a Were you able to get the supplies/equipment? yes Do you have any questions related to the use of the equipment or supplies? No  Functional Questionnaire: (I = Independent and D = Dependent) ADLs: i  Bathing/Dressing- i  Meal Prep- i  Eating- i  Maintaining continence- i  Transferring/Ambulation- i  Managing Meds- i  Follow up appointments reviewed:  PCP Hospital f/u appt confirmed? Yes  Scheduled to see Scarlette Calico, MD on 05/28/2021 @ 3:40 pm. [facility] f/u appt confirmed? No   Are transportation arrangements needed? No  If their condition worsens, is the pt aware to call PCP or go to the Emergency Dept.? Yes Was the patient provided with contact information for the PCP's office or ED? Yes Was to pt encouraged to call back with questions or concerns? Yes

## 2021-05-22 DIAGNOSIS — D649 Anemia, unspecified: Secondary | ICD-10-CM | POA: Diagnosis not present

## 2021-05-22 DIAGNOSIS — F32A Depression, unspecified: Secondary | ICD-10-CM | POA: Diagnosis not present

## 2021-05-22 DIAGNOSIS — I152 Hypertension secondary to endocrine disorders: Secondary | ICD-10-CM | POA: Diagnosis not present

## 2021-05-22 DIAGNOSIS — M069 Rheumatoid arthritis, unspecified: Secondary | ICD-10-CM | POA: Diagnosis not present

## 2021-05-22 DIAGNOSIS — E1142 Type 2 diabetes mellitus with diabetic polyneuropathy: Secondary | ICD-10-CM | POA: Diagnosis not present

## 2021-05-22 DIAGNOSIS — E1169 Type 2 diabetes mellitus with other specified complication: Secondary | ICD-10-CM | POA: Diagnosis not present

## 2021-05-22 DIAGNOSIS — F419 Anxiety disorder, unspecified: Secondary | ICD-10-CM | POA: Diagnosis not present

## 2021-05-22 DIAGNOSIS — M25552 Pain in left hip: Secondary | ICD-10-CM | POA: Diagnosis not present

## 2021-05-22 DIAGNOSIS — I25110 Atherosclerotic heart disease of native coronary artery with unstable angina pectoris: Secondary | ICD-10-CM | POA: Diagnosis not present

## 2021-05-22 DIAGNOSIS — I6523 Occlusion and stenosis of bilateral carotid arteries: Secondary | ICD-10-CM | POA: Diagnosis not present

## 2021-05-22 DIAGNOSIS — M797 Fibromyalgia: Secondary | ICD-10-CM | POA: Diagnosis not present

## 2021-05-22 DIAGNOSIS — J449 Chronic obstructive pulmonary disease, unspecified: Secondary | ICD-10-CM | POA: Diagnosis not present

## 2021-05-22 DIAGNOSIS — K219 Gastro-esophageal reflux disease without esophagitis: Secondary | ICD-10-CM | POA: Diagnosis not present

## 2021-05-22 DIAGNOSIS — E1151 Type 2 diabetes mellitus with diabetic peripheral angiopathy without gangrene: Secondary | ICD-10-CM | POA: Diagnosis not present

## 2021-05-22 DIAGNOSIS — E785 Hyperlipidemia, unspecified: Secondary | ICD-10-CM | POA: Diagnosis not present

## 2021-05-22 DIAGNOSIS — G8929 Other chronic pain: Secondary | ICD-10-CM | POA: Diagnosis not present

## 2021-05-22 DIAGNOSIS — N179 Acute kidney failure, unspecified: Secondary | ICD-10-CM | POA: Diagnosis not present

## 2021-05-22 DIAGNOSIS — I4819 Other persistent atrial fibrillation: Secondary | ICD-10-CM | POA: Diagnosis not present

## 2021-05-22 DIAGNOSIS — I252 Old myocardial infarction: Secondary | ICD-10-CM | POA: Diagnosis not present

## 2021-05-22 DIAGNOSIS — I060 Rheumatic aortic stenosis: Secondary | ICD-10-CM | POA: Diagnosis not present

## 2021-05-22 DIAGNOSIS — R55 Syncope and collapse: Secondary | ICD-10-CM | POA: Diagnosis not present

## 2021-05-22 DIAGNOSIS — M1711 Unilateral primary osteoarthritis, right knee: Secondary | ICD-10-CM | POA: Diagnosis not present

## 2021-05-22 DIAGNOSIS — J9611 Chronic respiratory failure with hypoxia: Secondary | ICD-10-CM | POA: Diagnosis not present

## 2021-05-22 DIAGNOSIS — I5030 Unspecified diastolic (congestive) heart failure: Secondary | ICD-10-CM | POA: Diagnosis not present

## 2021-05-23 ENCOUNTER — Telehealth: Payer: Self-pay | Admitting: Cardiology

## 2021-05-23 ENCOUNTER — Other Ambulatory Visit: Payer: Self-pay | Admitting: Cardiology

## 2021-05-23 NOTE — Chronic Care Management (AMB) (Signed)
error 

## 2021-05-23 NOTE — Chronic Care Management (AMB) (Signed)
Have you had any problems recently with your health?  pt stated she is experiencing some swelling and numbness in her left foot Have you had any problems with your pharmacy?  pt stated she is not having any problems with her pharmacy at the moment. What issues or side effects are you having with your medications?  pt stated she is not aware of any side effects. What would you like me to pass along to [facility], CPP for them to help you with?   pt stated there is nothing at the moment. What can we do to take care of you better?   pt stated there is nothing at the moment.  [place] Pharmacist Assistant [phone]

## 2021-05-23 NOTE — Telephone Encounter (Signed)
Patient needs a hospital f/u. I offered to schedule her with Dr. Ellyn Hack on 10/11 at 9:20am in his open slot but pt stated that was too early in the morning and appt has to be after 12. I offered to schedule her with an APP but she only wants to see Dr. Ellyn Hack. Patient is willing to wait until next year to see him but he is unfortunately booked out until April and that schedule has not been released. Not sure if Dr. Ellyn Hack is willing to double book her, please advise.

## 2021-05-23 NOTE — Telephone Encounter (Signed)
Returned call to patient who needs post hospital follow up but requests an afternoon appointment. Made patient an appointment for 11/8 at 4:30 with Dr. Ellyn Hack.   Advised patient to call back to office with any issues, questions, or concerns. Patient verbalized understanding.

## 2021-05-26 ENCOUNTER — Telehealth: Payer: Self-pay | Admitting: Internal Medicine

## 2021-05-26 DIAGNOSIS — M1711 Unilateral primary osteoarthritis, right knee: Secondary | ICD-10-CM

## 2021-05-26 DIAGNOSIS — R531 Weakness: Secondary | ICD-10-CM

## 2021-05-26 NOTE — Telephone Encounter (Signed)
Also wants to add orders for commode chair and grab bars

## 2021-05-26 NOTE — Telephone Encounter (Signed)
Katherine [name] from [facility] has called to give a verbal order for pt. To begin PT for 1x week for 9 weeks. Also, states that he has added Education officer, museum and OT for evaluation. Home health aide has been ordered for 2x week- 4 weeks and 1x week- 3 weeks. Would like call back, with any questions at [phone]

## 2021-05-26 NOTE — Telephone Encounter (Signed)
Verbal orders have been given.   DME for bedside commode and grab bars have been entered and given to PCP to sign.   Will fax to [employer] per Shree's instruction.

## 2021-05-27 NOTE — Telephone Encounter (Signed)
DME orders signed and faxed to [employer].

## 2021-05-28 ENCOUNTER — Inpatient Hospital Stay: Payer: Medicare Other | Admitting: Internal Medicine

## 2021-05-29 DIAGNOSIS — E1151 Type 2 diabetes mellitus with diabetic peripheral angiopathy without gangrene: Secondary | ICD-10-CM | POA: Diagnosis not present

## 2021-05-29 DIAGNOSIS — I5030 Unspecified diastolic (congestive) heart failure: Secondary | ICD-10-CM | POA: Diagnosis not present

## 2021-05-29 DIAGNOSIS — I152 Hypertension secondary to endocrine disorders: Secondary | ICD-10-CM | POA: Diagnosis not present

## 2021-05-29 DIAGNOSIS — I4819 Other persistent atrial fibrillation: Secondary | ICD-10-CM | POA: Diagnosis not present

## 2021-05-29 DIAGNOSIS — I25110 Atherosclerotic heart disease of native coronary artery with unstable angina pectoris: Secondary | ICD-10-CM | POA: Diagnosis not present

## 2021-05-29 DIAGNOSIS — E1169 Type 2 diabetes mellitus with other specified complication: Secondary | ICD-10-CM | POA: Diagnosis not present

## 2021-05-30 ENCOUNTER — Telehealth: Payer: Self-pay | Admitting: *Deleted

## 2021-05-30 ENCOUNTER — Telehealth: Payer: Self-pay | Admitting: Internal Medicine

## 2021-05-30 DIAGNOSIS — M797 Fibromyalgia: Secondary | ICD-10-CM

## 2021-05-30 MED ORDER — OXYCODONE HCL 10 MG PO TABS: 10.0000 mg | ORAL_TABLET | Freq: Four times a day (QID) | ORAL | 0 refills | Status: DC | PRN

## 2021-05-30 NOTE — Telephone Encounter (Signed)
PMP was Reviewed. Oxycodone e-scribed today.  She has a Video Visit scheduled with Dr Naaman Plummer next week. Placed a call to Ms. Libbey regarding the medication refill, she verbalizes understanding.

## 2021-05-30 NOTE — Telephone Encounter (Signed)
Katherine [name] says they never received office notes from previous visit 10.10.22  Please refax [phone]

## 2021-05-30 NOTE — Telephone Encounter (Signed)
[facility] Debbie Clapp--[phone]  Mrs. Viramontes declined social work visit for today  Nurse needs verbal order to go next week

## 2021-05-30 NOTE — Telephone Encounter (Signed)
Requesting a refill on her oxydodone.  Her appt is 06/04/21 video visit with Dr Naaman Plummer but she will be out before then. Last fill date 04/28/21.

## 2021-06-02 ENCOUNTER — Telehealth: Payer: Self-pay | Admitting: Cardiology

## 2021-06-02 NOTE — Telephone Encounter (Signed)
Last OV note has been faxed.

## 2021-06-02 NOTE — Telephone Encounter (Signed)
Patient called and only wants to talk with Dr. Ellyn Hack. Patient suffers from bi-polar and was constantly crying on the line. Just got out of the hospital not to long ago. Patient does not want to speak with a nurse or PA, only Dr. Ellyn Hack

## 2021-06-02 NOTE — Telephone Encounter (Signed)
Verbal orders given via VM 

## 2021-06-02 NOTE — Telephone Encounter (Signed)
.    Telephone call will be a relatively long endeavor.  I would prefer this to be done as a virtual telephone visit as opposed to be just simply calling her.  I can try to call as a telephone visit this Friday.  We can add her on as a last patient of the day.  [name], MD

## 2021-06-03 NOTE — Telephone Encounter (Signed)
Spoke to patient . She aware can set up a virtual visit for Friday with [name].  Schedule for 10:20 . Patient is aware it may be closer to 11:30 - 12 noon for  visit  patient would like to video visit through  phone not mychart Verbalized understanding

## 2021-06-04 ENCOUNTER — Encounter: Payer: Self-pay | Admitting: Physical Medicine & Rehabilitation

## 2021-06-04 ENCOUNTER — Encounter: Payer: Medicare Other | Attending: Physical Medicine & Rehabilitation | Admitting: Physical Medicine & Rehabilitation

## 2021-06-04 ENCOUNTER — Other Ambulatory Visit: Payer: Self-pay

## 2021-06-04 DIAGNOSIS — M797 Fibromyalgia: Secondary | ICD-10-CM | POA: Insufficient documentation

## 2021-06-04 MED ORDER — OXYCODONE HCL 10 MG PO TABS: 10.0000 mg | ORAL_TABLET | Freq: Four times a day (QID) | ORAL | 0 refills | Status: DC | PRN

## 2021-06-04 NOTE — Progress Notes (Signed)
Subjective:    Patient ID: Katherine [name], female    DOB: 1956/05/20, 65 y.o.   MRN: [id number]  HPI  The patient has consented to a tele-health/telephone encounter with [facility]. The patient is at home and the provider is at the office. Two separate patient identifiers were utilized to confirm the identity of the patient.        Katherine [name] is on the phone with me today regarding her  chronic pain syndrome.  She has been in and out of the hospital over the last few months.  She was in again in early October after passing out at home.  She was treated for prolonged QT with torsades de pointes. Some of her medications were stopped including her anti-depressants which have caused her to cry more and feel more labile.   Her pain levels have been tolerable with her oxycodone. She has come to terms with the fact that she will never be "out of pain" and this seems to have helped.   HH PT has been coming out to the house and she's actually been more mobile than before. She has lost 30lbs over this past year     Pain Inventory Average Pain 8 Pain Right Now 5 My pain is constant, sharp, stabbing, tingling, and aching  In the last 24 hours, has pain interfered with the following? General activity 5 Relation with others 5 Enjoyment of life 5 What TIME of day is your pain at its worst? morning  Sleep (in general) Fair  Pain is worse with: walking, bending, sitting, inactivity, and standing Pain improves with: rest, heat/ice, medication, and injections Relief from Meds: 5  Family History  Problem Relation Age of Onset   Cancer Mother        liver   Heart disease Father    Cancer Father        colon   Arrhythmia Brother        Atrial Fibrillation   Arrhythmia Paternal Aunt        Atrial Fibrillation   Social History   Socioeconomic History   Marital status: Widowed    Spouse name: Not on file   Number of children: Not on file    Years of education: Not on file   Highest education level: Not on file  Occupational History   Not on file  Tobacco Use   Smoking status: Every Day    Packs/day: 1.00    Years: 30.00    Pack years: 30.00    Types: Cigarettes    Last attempt to quit: 08/17/2002    Years since quitting: 18.8   Smokeless tobacco: Never  Vaping Use   Vaping Use: Never used  Substance and Sexual Activity   Alcohol use: No   Drug use: No   Sexual activity: Not on file  Other Topics Concern   Not on file  Social History Narrative   ** Merged History Encounter ** She is currently married, and the caregiver of her husband who is recovering from surgery for tongue cancer now diagnosed with lung cancer. Prior to his diagnosis of her husband, she actually had adopted a 52-year-old child who she knows caring for as well. With all the surgeries, they have been quite financially troubled. Thanks the help of her community and church, they have been able to stay "alfoat."     She is a former smoker who quit in 2004 after a 30-pack-year history.   She  is active chasing a 19-year-old child, does not do routine exercise. She's been quite depressed with the condition of her husband, and admits to eating comfort herself.   She does not drink alcohol.      05/13/2021 Patient reports that her husband is deceased. She is grieving the loss of her husband and the decline in her physical independence.    Social Determinants of Health   Financial Resource Strain: Not on file  Food Insecurity: No Food Insecurity   Worried About Charity fundraiser in the Last Year: Never true   Ran Out of Food in the Last Year: Never true  Transportation Needs: No Transportation Needs   Lack of Transportation (Medical): No   Lack of Transportation (Non-Medical): No  Physical Activity: Not on file  Stress: Not on file  Social Connections: Not on file   Past Surgical History:  Procedure Laterality Date   ABDOMINAL AORTOGRAM N/A 04/21/2018    Procedure: ABDOMINAL AORTOGRAM;  Surgeon: [name], MD;  Location: [place] [facility];  Service: Cardiovascular;  Laterality: N/A;   CATHETER REMOVAL     CHOLECYSTECTOMY N/A 10/29/2014   Procedure: LAPAROSCOPIC CHOLECYSTECTOMY WITH INTRAOPERATIVE CHOLANGIOGRAM;  Surgeon: Excell Seltzer, MD;  [facility];  Service: General;  Laterality: N/A;   CORONARY ANGIOPLASTY  1994   x5   CORONARY ARTERY BYPASS GRAFT  1995   LIMA-LAD, SVG-RPDA, SVG-D1   CORONARY STENT INTERVENTION N/A 02/20/2021   Procedure: CORONARY STENT INTERVENTION;  Surgeon: [name], MD;  Location: [place];  Service: Cardiovascular;  Laterality: N/A;   ESOPHAGOGASTRODUODENOSCOPY N/A 10/15/2016   Procedure: ESOPHAGOGASTRODUODENOSCOPY (EGD);  Surgeon: Wilford Corner, MD;  Location: [facility];  Service: Endoscopy;  Laterality: N/A;   I & D EXTREMITY Right 01/29/2018   Procedure: IRRIGATION AND DEBRIDEMENT THUMB;  Surgeon: Dayna Barker, MD;  Location: [facility];  Service: Plastics;  Laterality: Right;   INCISE AND DRAIN ABCESS     INTRAVASCULAR PRESSURE WIRE/FFR STUDY N/A 02/20/2021   Procedure: INTRAVASCULAR PRESSURE WIRE/FFR STUDY;  Surgeon: [name], MD;  Location: [place] [facility];  Service: Cardiovascular;  Laterality: N/A;   KIDNEY TRANSPLANT  1991   KNEE ARTHROSCOPY WITH LATERAL MENISECTOMY Left 12/03/2017   Procedure: LEFT KNEE ARTHROSCOPY WITH LATERAL MENISECTOMY;  Surgeon: Earlie Server, MD;  Location: [facility];  Service: Orthopedics;  Laterality: Left;   LAPAROSCOPIC GASTRIC BANDING  04/2004; 10/'09, 2/'10   Port Replacement x 2   LEFT HEART CATH AND CORONARY ANGIOGRAPHY N/A 02/20/2021   Procedure: LEFT HEART CATH AND CORONARY ANGIOGRAPHY;  Surgeon: [name], MD;  Location: [place] [facility];  Service: Cardiovascular;  Laterality: N/A;   LEFT HEART CATH AND CORS/GRAFTS ANGIOGRAPHY N/A 04/21/2018   Procedure: LEFT HEART CATH AND CORS/GRAFTS ANGIOGRAPHY;  Surgeon: [name], MD;   Location: [place] [facility];  Ost-Prox LAD 50% - proxLAD (pre & post D1) 100% CTO. Cx - patent, small OM1 (stable ~ ostial OM1 90%, too small for PCI) & 2 small LPL; Ost-distal RCA 100% CTO.  LIMA-LAD (not injected); SVG-dRCA patent, SVG-D1 - insertion stent ~20% ISR - Severe R CFA disease w/ focal Sub TO   LEFT HEART CATH AND CORS/GRAFTS ANGIOGRAPHY  5/'01, 3/'02, 8/'03, 10/'04; 1/'15   08/22/2013: LAD & RCA 100%; LIMA-LAD & SVG-rPDA patent; Cx-- OM1 60%, OM2 ostial ~50%; SVG-D1 - 80% mid, 50% distal ISR --PCI   LEFT HEART CATH AND CORS/GRAFTS ANGIOGRAPHY N/A 01/31/2021   Procedure: LEFT HEART  CATH AND CORS/GRAFTS ANGIOGRAPHY;  Surgeon: Jolaine Artist, MD;  Location: [place] [facility];  Service: Cardiovascular;  Laterality: N/A;   LEFT HEART CATHETERIZATION WITH CORONARY/GRAFT ANGIOGRAM N/A 08/23/2013   Procedure: LEFT HEART CATHETERIZATION WITH Beatrix Fetters;  Surgeon: Wellington Hampshire, MD;  Location: [facility];  Service: Cardiovascular;  Laterality: N/A;   Lower Extremity Arterial Dopplers  08/2013   ABI: R 0.96, L 1.04   MULTIPLE TOOTH EXTRACTIONS  age 22   NM MYOVIEW LTD  03/2016   EF 62%. LOW RISK. C/W prior MI - no Ischemia. Apical hypokinesis.   PERCUTANEOUS CORONARY STENT INTERVENTION (PCI-S)  5/'01, 3/'02, 8/'03, 10/'04;   '01 - S660 BMS 2.5 x 9 - dSVG-D1 into D1; '02- post-stent stenosis - 2.5 x 8 Pixel BMS; '8\03: ISR/Thrombosis into native D1 - AngioJet, 2.5 x 13 Pixel; '04 - ISR 95% - covered stented area with Taxus DES 2.5 mm x 20 (2.88)   PERCUTANEOUS CORONARY STENT INTERVENTION (PCI-S)  08/23/2013   Procedure: PERCUTANEOUS CORONARY STENT INTERVENTION (PCI-S);  Surgeon: Wellington Hampshire, MD;  Location: [facility] CATH LAB;  Service: Cardiovascular;;mid SVG-D1 80%; distal stent ~50% ISR; Promus Prermier DES 2.75 mm xc 20 mm (2.8 mm)   PORT-A-CATH REMOVAL     kidney   TRANSTHORACIC ECHOCARDIOGRAM  07/2019   EF 55 to 60%.  No LVH.  Paradoxical septal wall motion-post CABG.  GRII  DD.  Normal RV size and function.  Mild bilateral atrial dilation.  Moderate MAC.  Trace MR.  Mild aortic stenosis (gradients: Mean 14.3 mmHg -peak 24.9 mmHg).   TUBAL LIGATION     wrist fistula repair Left    dialysis for one year   Past Surgical History:  Procedure Laterality Date   ABDOMINAL AORTOGRAM N/A 04/21/2018   Procedure: ABDOMINAL AORTOGRAM;  Surgeon: [name], MD;  Location: [place] [facility];  Service: Cardiovascular;  Laterality: N/A;   CATHETER REMOVAL     CHOLECYSTECTOMY N/A 10/29/2014   Procedure: LAPAROSCOPIC CHOLECYSTECTOMY WITH INTRAOPERATIVE CHOLANGIOGRAM;  Surgeon: Excell Seltzer, MD;  [facility];  Service: General;  Laterality: N/A;   CORONARY ANGIOPLASTY  1994   x5   CORONARY ARTERY BYPASS GRAFT  1995   LIMA-LAD, SVG-RPDA, SVG-D1   CORONARY STENT INTERVENTION N/A 02/20/2021   Procedure: CORONARY STENT INTERVENTION;  Surgeon: [name], MD;  Location: [place] [facility];  Service: Cardiovascular;  Laterality: N/A;   ESOPHAGOGASTRODUODENOSCOPY N/A 10/15/2016   Procedure: ESOPHAGOGASTRODUODENOSCOPY (EGD);  Surgeon: Wilford Corner, MD;  Location: [facility] ENDOSCOPY;  Service: Endoscopy;  Laterality: N/A;   I & D EXTREMITY Right 01/29/2018   Procedure: IRRIGATION AND DEBRIDEMENT THUMB;  Surgeon: Dayna Barker, MD;  Location: [place];  Service: Plastics;  Laterality: Right;   INCISE AND DRAIN ABCESS     INTRAVASCULAR PRESSURE WIRE/FFR STUDY N/A 02/20/2021   Procedure: INTRAVASCULAR PRESSURE WIRE/FFR STUDY;  Surgeon: [name], MD;  Location: [place] [facility];  Service: Cardiovascular;  Laterality: N/A;   KIDNEY TRANSPLANT  1991   KNEE ARTHROSCOPY WITH LATERAL MENISECTOMY Left 12/03/2017   Procedure: LEFT KNEE ARTHROSCOPY WITH LATERAL MENISECTOMY;  Surgeon: Earlie Server, MD;  Location: [place];  Service: Orthopedics;  Laterality: Left;   LAPAROSCOPIC GASTRIC BANDING  04/2004; 10/'09, 2/'10   Port Replacement x 2   LEFT HEART CATH AND CORONARY  ANGIOGRAPHY N/A 02/20/2021   Procedure: LEFT HEART CATH AND CORONARY ANGIOGRAPHY;  Surgeon: [name], MD;  Location: [facility];  Service:  Cardiovascular;  Laterality: N/A;   LEFT HEART CATH AND CORS/GRAFTS ANGIOGRAPHY N/A 04/21/2018   Procedure: LEFT HEART CATH AND CORS/GRAFTS ANGIOGRAPHY;  Surgeon: [name], MD;  Location: [place] [facility];  Ost-Prox LAD 50% - proxLAD (pre & post D1) 100% CTO. Cx - patent, small OM1 (stable ~ ostial OM1 90%, too small for PCI) & 2 small LPL; Ost-distal RCA 100% CTO.  LIMA-LAD (not injected); SVG-dRCA patent, SVG-D1 - insertion stent ~20% ISR - Severe R CFA disease w/ focal Sub TO   LEFT HEART CATH AND CORS/GRAFTS ANGIOGRAPHY  5/'01, 3/'02, 8/'03, 10/'04; 1/'15   08/22/2013: LAD & RCA 100%; LIMA-LAD & SVG-rPDA patent; Cx-- OM1 60%, OM2 ostial ~50%; SVG-D1 - 80% mid, 50% distal ISR --PCI   LEFT HEART CATH AND CORS/GRAFTS ANGIOGRAPHY N/A 01/31/2021   Procedure: LEFT HEART CATH AND CORS/GRAFTS ANGIOGRAPHY;  Surgeon: Jolaine Artist, MD;  Location: [place] [facility];  Service: Cardiovascular;  Laterality: N/A;   LEFT HEART CATHETERIZATION WITH CORONARY/GRAFT ANGIOGRAM N/A 08/23/2013   Procedure: LEFT HEART CATHETERIZATION WITH Beatrix Fetters;  Surgeon: Wellington Hampshire, MD;  Location: [place] [facility];  Service: Cardiovascular;  Laterality: N/A;   Lower Extremity Arterial Dopplers  08/2013   ABI: R 0.96, L 1.04   MULTIPLE TOOTH EXTRACTIONS  age 35   NM MYOVIEW LTD  03/2016   EF 62%. LOW RISK. C/W prior MI - no Ischemia. Apical hypokinesis.   PERCUTANEOUS CORONARY STENT INTERVENTION (PCI-S)  5/'01, 3/'02, 8/'03, 10/'04;   '01 - S660 BMS 2.5 x 9 - dSVG-D1 into D1; '02- post-stent stenosis - 2.5 x 8 Pixel BMS; '8\03: ISR/Thrombosis into native D1 - AngioJet, 2.5 x 13 Pixel; '04 - ISR 95% - covered stented area with Taxus DES 2.5 mm x 20 (2.88)   PERCUTANEOUS CORONARY STENT INTERVENTION (PCI-S)  08/23/2013   Procedure: PERCUTANEOUS CORONARY STENT  INTERVENTION (PCI-S);  Surgeon: Wellington Hampshire, MD;  Location: [facility] CATH LAB;  Service: Cardiovascular;;mid SVG-D1 80%; distal stent ~50% ISR; Promus Prermier DES 2.75 mm xc 20 mm (2.8 mm)   PORT-A-CATH REMOVAL     kidney   TRANSTHORACIC ECHOCARDIOGRAM  07/2019   EF 55 to 60%.  No LVH.  Paradoxical septal wall motion-post CABG.  GRII DD.  Normal RV size and function.  Mild bilateral atrial dilation.  Moderate MAC.  Trace MR.  Mild aortic stenosis (gradients: Mean 14.3 mmHg -peak 24.9 mmHg).   TUBAL LIGATION     wrist fistula repair Left    dialysis for one year   Past Medical History:  Diagnosis Date   Anemia    Anxiety    Bilateral carotid artery stenosis    Carotid duplex 12/8848: 2-77% LICA, 41-28% RICA, >78% RECA, f/u 1 yr suggested   CAD (coronary artery disease) of bypass graft 5/01; 3/'02, 8/'03, 10/'04; 1/15   PCI x 5 to SVG-D1    CAD in native artery 07/1993   3 Vessel Disease (LAD-D1 & RCA) -- CABG (Dx in setting of inferior STEMI-PTCA of RCA)   CAD S/P percutaneous coronary angioplasty    PCI to SVG-D1 insertion/native D1 x 4 = '01 -(S660 BMS 2.5 x 9 anastomosis- D1); '02 - distal overlap ACS Pixel 2.5 x 8  BMS; '03 distal/native ISR/Thrombosis - Pixel 2.5 x 13; '04 - ISR-  Taxus 2.5 x 20 (covered all);; 1/15 - mid SVG-D1 (50% distal ISR) - Promus P 2.75 x 20 -- 2.8 mm   COPD mixed type ([facility])  Followed by Dr. Lamonte Sakai "pulmonologist said no COPD"   Depression    Depression with anxiety    Diabetes mellitus type 2 in obese ([place])    Diarrhea    started after cholecystectomy and mass removed from intestine   Dyslipidemia, goal LDL below 70    08/2012: TC 137, TG 200, HDL 32!, LDL 45; on statin (followed by Dr.Deterding)   ESRD (end stage renal disease) ([place]) 1991   s/p Cadaveric Renal Transplant [facility] - Dr. Jimmy Footman)    Family history of adverse reaction to anesthesia    mom's bp dropped during/after anesthesia   Fibromyalgia    GERD (gastroesophageal reflux disease)     Glomerulonephritis, chronic, rapidly progressive 1989   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 07/1993   Rescue PTCA of RCA -- referred for CABG.   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 Servando Snare: LIMA-LAD, SVG-bifurcatingD1, SVG-rPDA   History of kidney stones    History of stroke 2012   "right eye stroke- half blind now"   Hypertension associated with diabetes ([place])    Mild aortic stenosis by prior echocardiogram 07/2019   Echo:  Mild aortic stenosis (gradients: Mean 14.3 mmHg -peak 24.9 mmHg).   Morbid obesity ([facility])    MRSA (methicillin resistant staph aureus) culture positive    OSA (obstructive sleep apnea)    no longer on CPAP or home O2, states she doesn't need now after lap band   PAD (peripheral artery disease) ([place]) 08/2013   LEA Dopplers to be read by Dr. Fletcher Anon   PAF (paroxysmal atrial fibrillation) ([place]) 06/2014   Noted on CardioNet Monitor  - --> rhythm control with Tikosyn (Dr. Rayann Heman); converted from warfarin to apixaban for anticoagulation.   Pneumonia    Recurrent boils    Bilateral Groin   Rheumatoid arthritis ([place])    Per Patient Report; associated with OA   S/p cadaver renal transplant 1991   [facility]   Unstable angina ([place]) 5/01; 3/'02, 8/'03, 10/'04; 1/15   x 5 occurences since Inf-Post STEMI in 1994   There were no vitals taken for this visit.  Opioid Risk Score:   Fall Risk Score:  `1  Depression screen PHQ 2/9  Depression screen [facility] 2/9 03/13/2021 03/12/2021 03/12/2021 12/25/2020 01/04/2020  Decreased Interest _0 0  Down, Depressed, Hopeless _1 PHQ - 2 Score _2 Altered sleeping 2 - - - -  Tired, decreased energy 2 - - - -  Change in appetite 1 - - - -  Feeling bad or failure about yourself  0 - - - -  Trouble concentrating 1 - - - -  Moving slowly or fidgety/restless 1 - - - -  Suicidal thoughts 0 - - - -  PHQ-9 Score 11 - - - -  Difficult doing  work/chores Somewhat difficult - - - -  Some recent data might be hidden     Review of Systems  Musculoskeletal:  Positive for back pain, gait problem and neck pain.       Bilateral shoulder pain Pain in groin Pain buttocks  Bilateral feet pain  All other systems reviewed and are negative.     Objective:   Physical Exam        Assessment & Plan:   1. Fibromyalgia:               -  Cymbalta (32m) and zoloft both stopped by cards             - improving endurance and standing balance. Increase gait as possible at home.   -HH PT to continue 2. Rheumatoid Arthritis: Refilled: Oxycodone 150mone tablet every 8 hours as needed for severe pain #85.             -she'll call for rx next month             We will continue the controlled substance monitoring program, this consists of regular clinic visits, examinations, routine drug screening, pill counts as well [place].   3. Osteoarthritis--polyarticular with left hip/inguinal pain:  ordered x-rays of her left hip today to rule out further degenerative disease/RA              -HH, HEP as possible 4. ESRD with hx of renal transplant on chronic steroids:              -Nephrology following             -volume mgt per cards also 5. Right rotator cuff tendonitis/like degenerative/inflammatory changes in the right shoulder also.              -shoulder ROM 6. Dysuria: recent UTI 7. CAD/MI per cardiology team 6. Depression:  resume cymbalta 6049mnd/or zoloft 100m36mily once cleared by cardiology 8. Lumbar Radiculitis: previous  Left L4-5 Transformanial ESI with relief   10. Type 2 DM with Peripheral Neuropathy             -lamictal 25mg67m   12 minutes of tele-visit time was spent with this patient today. Follow up in 2 mos months

## 2021-06-06 ENCOUNTER — Ambulatory Visit (INDEPENDENT_AMBULATORY_CARE_PROVIDER_SITE_OTHER): Payer: Medicare Other

## 2021-06-06 ENCOUNTER — Telehealth: Payer: Self-pay | Admitting: *Deleted

## 2021-06-06 ENCOUNTER — Encounter: Payer: Self-pay | Admitting: Cardiology

## 2021-06-06 ENCOUNTER — Telehealth (INDEPENDENT_AMBULATORY_CARE_PROVIDER_SITE_OTHER): Payer: Medicare Other | Admitting: Cardiology

## 2021-06-06 VITALS — BP 161/99 | HR 88 | Ht 61.0 in

## 2021-06-06 DIAGNOSIS — Z7902 Long term (current) use of antithrombotics/antiplatelets: Secondary | ICD-10-CM

## 2021-06-06 DIAGNOSIS — E1159 Type 2 diabetes mellitus with other circulatory complications: Secondary | ICD-10-CM | POA: Diagnosis not present

## 2021-06-06 DIAGNOSIS — I2119 ST elevation (STEMI) myocardial infarction involving other coronary artery of inferior wall: Secondary | ICD-10-CM | POA: Diagnosis not present

## 2021-06-06 DIAGNOSIS — I5032 Chronic diastolic (congestive) heart failure: Secondary | ICD-10-CM

## 2021-06-06 DIAGNOSIS — I25119 Atherosclerotic heart disease of native coronary artery with unspecified angina pectoris: Secondary | ICD-10-CM

## 2021-06-06 DIAGNOSIS — F333 Major depressive disorder, recurrent, severe with psychotic symptoms: Secondary | ICD-10-CM

## 2021-06-06 DIAGNOSIS — T82855S Stenosis of coronary artery stent, sequela: Secondary | ICD-10-CM

## 2021-06-06 DIAGNOSIS — I2510 Atherosclerotic heart disease of native coronary artery without angina pectoris: Secondary | ICD-10-CM | POA: Diagnosis not present

## 2021-06-06 DIAGNOSIS — I498 Other specified cardiac arrhythmias: Secondary | ICD-10-CM | POA: Diagnosis not present

## 2021-06-06 DIAGNOSIS — I480 Paroxysmal atrial fibrillation: Secondary | ICD-10-CM | POA: Diagnosis not present

## 2021-06-06 DIAGNOSIS — E785 Hyperlipidemia, unspecified: Secondary | ICD-10-CM

## 2021-06-06 DIAGNOSIS — Z9861 Coronary angioplasty status: Secondary | ICD-10-CM

## 2021-06-06 DIAGNOSIS — I152 Hypertension secondary to endocrine disorders: Secondary | ICD-10-CM

## 2021-06-06 DIAGNOSIS — I4729 Other ventricular tachycardia: Secondary | ICD-10-CM

## 2021-06-06 DIAGNOSIS — I350 Nonrheumatic aortic (valve) stenosis: Secondary | ICD-10-CM | POA: Diagnosis not present

## 2021-06-06 MED ORDER — SERTRALINE HCL 100 MG PO TABS
100.0000 mg | ORAL_TABLET | Freq: Every day | ORAL | 2 refills | Status: DC
Start: 2021-06-06 — End: 2022-06-16

## 2021-06-06 NOTE — Assessment & Plan Note (Signed)
Probably multifactorial from the combination of medications and her diarrhea from UTIs etc.  She still having nausea.  Unfortunately multiple medications were stopped including Tikosyn and her Zoloft.  She is also not on phentermine anymore. X I think is probably okay for her to occasionally use Phenergan for nausea, and with her extensive psychiatric history we can simply stop 200 of Zoloft so we will have her restart 100 mg Zoloft.  Thankfully, she has had ischemic evaluation and echocardiogram and relatively normal and she is not having active symptoms now.  With her having some more fluttering sensations we are checking 2-week monitor and I will have her follow-up with our clinical pharmacist to review her medications to ensure there are no further conflicting medications.

## 2021-06-06 NOTE — Assessment & Plan Note (Signed)
Probably functional bicuspid valve with borderline mild stenosis.  Not a major issue, can reassess in a couple years if we do any other reason to check for function.

## 2021-06-06 NOTE — Telephone Encounter (Signed)
RN spoke to patient. Instruction were given  from today's virtual visit 06/06/21 .  AVS SUMMARY has been sent by [facility]  and mailed. Follow up appointments given.  Lab slip mailed. Patient aware of monitor being mailed to her brothers home  [address] [name] [address]   Patient verbalized understanding

## 2021-06-06 NOTE — Assessment & Plan Note (Signed)
She is on rosuvastatin.  Had lipids just checked in July.  LDL is dramatically reduced.  I think is probably partly related to her recent hospitalizations.  And the lack of any active symptoms, present to continue, but if there is concern about fatigue would consider a statin holiday for least a month to reassess.

## 2021-06-06 NOTE — Assessment & Plan Note (Signed)
She was told that she should not be on antiplatelet and thrombotic agents.  Unfortunate that is incorrect based on the recommendation she should be on the Plavix for 6 months in addition to Eliquis.  Indeed we do not want her to be on it long-term, but telling someone to stop antiplatelet agent prematurely could be in-stent thrombosis.   She will continue Eliquis and Plavix through December.  We are restarting Plavix.  We will stop as of January 1.  She will then continue with Eliquis monotherapy.    Eliquis can be held 2 to 3 days preop for knee surgery or procedures

## 2021-06-06 NOTE — Assessment & Plan Note (Signed)
Blood pressure is pretty high today, she says that this is before she took her meds and yesterday it was 128/70. >  She is also not feeling very well this morning with some nausea.  I suspect that is probably far artificially elevated.  For now, we will simply continue with current meds.  She is on Lopressor 100 mg twice daily   With her still having bouts of dizziness, but reluctant to push too much further at this point in time.  She is not having diastolic heart failure symptoms.

## 2021-06-06 NOTE — Telephone Encounter (Signed)
  Patient Consent for Virtual Visit        Katherine [name] has provided verbal consent on 06/06/2021 for a virtual visit (video or telephone).   CONSENT FOR VIRTUAL VISIT FOR:  Katherine [name]  By participating in this virtual visit I agree to the following:  I hereby voluntarily request, consent and authorize [facility] and its employed or contracted physicians, physician assistants, nurse practitioners or other licensed health care professionals (the Practitioner), to provide me with telemedicine health care services (the "Services") as deemed necessary by the treating Practitioner. I acknowledge and consent to receive the Services by the Practitioner via telemedicine. I understand that the telemedicine visit will involve communicating with the Practitioner through live audiovisual communication technology and the disclosure of certain medical information by electronic transmission. I acknowledge that I have been given the opportunity to request an in-person assessment or other available alternative prior to the telemedicine visit and am voluntarily participating in the telemedicine visit.  I understand that I have the right to withhold or withdraw my consent to the use of telemedicine in the course of my care at any time, without affecting my right to future care or treatment, and that the Practitioner or I may terminate the telemedicine visit at any time. I understand that I have the right to inspect all information obtained and/or recorded in the course of the telemedicine visit and may receive copies of available information for a reasonable fee.  I understand that some of the potential risks of receiving the Services via telemedicine include:  Delay or interruption in medical evaluation due to technological equipment failure or disruption; Information transmitted may not be sufficient (e.g. poor resolution of images) to allow for appropriate medical decision making by the  Practitioner; and/or  In rare instances, security protocols could fail, causing a breach of personal health information.  Furthermore, I acknowledge that it is my responsibility to provide information about my medical history, conditions and care that is complete and accurate to the best of my ability. I acknowledge that Practitioner's advice, recommendations, and/or decision may be based on factors not within their control, such as incomplete or inaccurate data provided by me or distortions of diagnostic images or specimens that may result from electronic transmissions. I understand that the practice of medicine is not an exact science and that Practitioner makes no warranties or guarantees regarding treatment outcomes. I acknowledge that a copy of this consent can be made available to me via my patient portal ([facility]), or I can request a printed copy by calling the office of [facility].    I understand that my insurance will be billed for this visit.   I have read or had this consent read to me. I understand the contents of this consent, which adequately explains the benefits and risks of the Services being provided via telemedicine.  I have been provided ample opportunity to ask questions regarding this consent and the Services and have had my questions answered to my satisfaction. I give my informed consent for the services to be provided through the use of telemedicine in my medical care

## 2021-06-06 NOTE — Assessment & Plan Note (Signed)
Would like to potentially consider afterload reduction if her pressures continue to be elevated.  Since I am not actively seeing her and she is relatively briefly out of hospital I would like to reassess her pressures and follow-up and adjust afterload reduction at that point.  Otherwise continue current dose of Lopressor and Imdur.  She is also on furosemide and I think she can go back to taking furosemide maybe 3 times a week at a minimum.

## 2021-06-06 NOTE — Progress Notes (Signed)
Virtual Visit via Telephone Note   This visit type was conducted due to national recommendations for restrictions regarding the COVID-19 Pandemic (e.g. social distancing) in an effort to limit this patient's exposure and mitigate transmission in our community.  Due to her co-morbid illnesses, this patient is at least at moderate risk for complications without adequate follow up.  This format is felt to be most appropriate for this patient at this time.  The patient did not have access to video technology/had technical difficulties with video requiring transitioning to audio format only (telephone).  All issues noted in this document were discussed and addressed.  No physical exam could be performed with this format.  Please refer to the patient's chart for her  consent to telehealth for [facility].   Patient has given verbal permission to conduct this visit via virtual appointment and to bill insurance 06/06/2021 5:51 PM     Evaluation Performed:  Follow-up visit  Date:  06/06/2021   ID:  Katherine [name], DOB 1955-10-14, MRN [id number]  Patient Location: Home Provider Location: Office/Clinic  PCP:  [name], MD  Cardiologist:  [name], MD  Electrophysiologist:  None   Chief Complaint:   Chief Complaint  Patient presents with   Hospitalization Follow-up    Has had several hospitalizations, most recently for syncope with torsades/PMT.   Coronary Artery Disease    I have not yet seen her since her PCI.  Not having any angina since then.   Atrial Fibrillation    She is starting to have some flutter sensations.  Dofetilide was discontinued after her most recent hospitalization with concerns for QT prolongation.    ====================================  ASSESSMENT & PLAN:    Problem List Items Addressed This Visit       Cardiology Problems   CAD S/P percutaneous coronary angioplasty - PCI x 5 to SVG-D1 (Chronic)   Relevant Orders   Comprehensive metabolic panel    Coronary artery disease involving native coronary artery of native heart with angina pectoris (HCC) (Chronic)   Relevant Orders   Comprehensive metabolic panel   Magnesium   Dyslipidemia, goal LDL below 70 (Chronic)   Paroxysmal atrial fibrillation (HCC); CHA2DS2VASc score F, HTN, CAD, CVA = 5 (Chronic)   Relevant Orders   Comprehensive metabolic panel   Magnesium   LONG TERM MONITOR (3-14 DAYS)   Polymorphic ventricular tachycardia   Relevant Orders   Comprehensive metabolic panel   Magnesium   LONG TERM MONITOR (3-14 DAYS)   Hypertension associated with diabetes (HCC) (Chronic)   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 - Primary (Chronic)   Relevant Orders   Comprehensive metabolic panel   Magnesium   Chronic diastolic CHF (congestive heart failure), NYHA class 2 (HCC) (Chronic)   Relevant Orders   Comprehensive metabolic panel   Magnesium     Other   Fluttering heart   Relevant Orders   Comprehensive metabolic panel   Magnesium   LONG TERM MONITOR (3-14 DAYS)   Severe episode of recurrent major depressive disorder, with psychotic features (HCC)   Relevant Medications   sertraline (ZOLOFT) 100 MG tablet   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 female with PMH summarized below who presents via telephone conferencing for a telehealth visit today as a hospital follow-up.  Katherine [name] is a former patient of Dr. Terance Ice with a history of CAD-CABG followed by PCI as well as PAD/carotid disease and PAF.   CARDIAC HISTORY:  CAD: 1994 -->  Inferior-posterior STEMI-> 3V CAD: LAD-D1  bifurcation & RCA -> emergent PTCA of infarct related artery (RCA) -->  => CABGx3: LIMA-LAD, SVG-D1, SVG-RPDA. Multiple Caths: 2001, 2002, 2003 than 2004 all with lesions in the SVG-D1.  Initial stent followed by's are followed by stent thrombosis.. Cath-PCI for UA was in January 2015: Native LAD and RCA 100% occluded.  LIMA-LAD and SVG-RPDA patent.  LCx - OM1 60% & ost OM 2 50% with normal OM 3.  SVG-D1 had 80% mid and 50% distal ISR --> 1- PCI with a Promus Premier DES 2.75 mm x 20 mm. Myoview August 2017 for preop: EF 60%.  Small anteroapical infarct but no ischemia.  Mild apical hypokinesis.  EF 62%.  LOW RISK Cath 04/2018 - stable findings; RCA & LAD-TO, Stable small OM1 90%. SVG-D1 ~20% distal insertion Stent ISR (otw patent), Patent SVG-dRCA (did not see LIMA). R CFA subtotal stenosis.  Cath June 2022 followed by PCI (see below) -> negative FFR of ostial LCx.  DES PCI of SVG-diagonal anastomosis with ISR PTCA of distal SVG stent.  New stent overlapped old.  (Right Radial -> unable to visualize LIMA) This was for recurrent Afib RVR --> had Angina-type pain with Afib EP consult - 9/23 - Tikosyn load.; -->  Tikosyn recently discontinued because of PMVT/torsades CHA2DS2Vasc Score (HTN, DM, F, CAD, CVA = 6) =>Warfarin. -> converted to Eliquis HFpEF:  Echo 07/2019: CHA2DS2VASc EF 55 to 60%.  No LVH.  Paradoxical septal wall motion-post CABG.  GRII DD.  Normal RV size and function.  Mild bilateral atrial dilation.  Moderate MAC.  Trace MR.  Mild aortic stenosis (gradients: Mean 14.3 mmHg -peak 24.9 mmHg). PAD: Occlusion of the right common femoral artery with left common femoral artery occlusion as well.  Has left upper extremity AV fistula. COPD/asthma on home O2 (followed by Dr. Lamonte Sakai History of ESRD status post DD KT on Imuran and prednisone Rheumatoid arthritis Anemia chronic disease Morbid Obesity Status Post Lap Band   Katherine [name] was last seen in the clinic by me on December 06, 2019 ->  this was a delayed follow-up (she has a tendency to miss visits).  She was having occasional squeezing in her chest with some left arm discomfort and tingling.  Happens with and without exertion.  Often occur occurred when she is tired or pushing her self.  Was happy on Eliquis.  Had switched from Tegretol to Lamictal.  There was concern about interaction.  Had not had any recurrence of A. fib.  No PND orthopnea.  Baseline dyspnea from deconditioning and arthritis pains.   Recent Hospitalizations:  07/11/2020: Admitted with acute respiratory failure.  She was having difficulty breathing because her roommate was smoking.  Started having productive cough brownish-green sputum-pneumonia/SIRS.  Also thought to have some diastolic heart failure (EF was 55 to 60% with grade 3 diastolic smoking) apparently she had been holding her Lasix for a week prior to presentation.  Was hypoxic on arrival and was diuresed ->.  Placed on cefepime and azithromycin that was converted to Augmentin on discharge.  Discharged on 3 L of O2.  01/28/2021: Admitted for progressive  dyspnea and substernal chest pain worse over the last few days.  Thought to be acute on chronic heart failure -> IV Lasix, converted from diltiazem to beta-blocker. Underwent cardiac cath-found to have a 60% ostial LCx with significant SVG-diagonal graft (see below) plan was to treat medically and evaluate circumflex plus for ischemia and perform PCI on the SVG-D2 if symptoms recur (very difficult imaging via radial approach). ->  Statin increased, told to restart Lasix. Recommended vascular surgical referral in the outpatient setting.  7/6-03/2021: Readmitted for recurrent chest pain that awoke her from sleep. => Felt to be ACS/unstable angina and referred for PCI. ->  Negative FFR of ostial LCx.  ISR PTCA it of the SVG-D2 with DES PCI of the graft into the native vessel anastomosis. => Plan was 6 months of Plavix plus Eliquis along with beta-blocker, Imdur and  metoprolol.  Also continued on Lasix 40 mg daily.  A1c noted to be 9.1. ->  Discharged after overnight evaluation. 8/5-01/2021: Overnight admission to evaluate chest pain with taking a deep breath and coughing.  Felt like pain was likely costochondritis musculoskeletal pain.  Given Voltaren gel. 03/30/2021 ER visit for acute cystitis -> was in the process of completing amoxicillin course.  Was converted to [place]. ER visit 04/12/2021: Suffered a fall at home, and then the emergency room.  She is set her feet slipped out underneath her.  Pain all over because of fibromyalgia.  Because potential head trauma was monitored closely because of her being on Eliquis. 9/3-01/2021: Presented with nausea vomiting abdominal pain as well as diarrhea.  Unable to keep down any p.o. intake.--Intractable diarrhea and nausea/vomiting.  No acute abnormality noted on CT.  Felt to be viral gastroenteritis.  Again should keep.  Placed on Keppra.  Mild troponin elevation secondary to demand ischemia.  With volume depleted.  Given IV fluids.  Plan was to resume home diuretics on discharge.  Was on Cymbalta Flexeril, Zoloft and Lamictal along with Tikosyn. 9/25-10/09/2020: Admitted with syncope and palpitations.  Witnessed syncopal event, unexpected.  No prodrome.  EMS EKG showed torsade de pointes-was treated with magnesium and dopamine for mild bradycardia admitted overnight in the ICU.  Medications that could prolong her QT interval were discontinued including Phenergan and Zoloft temporarily Cymbalta/Lamictal along with Tikosyn.  Recurrent UTI treated with 3 days IV Rocephin.   Reviewed  CV studies:     The following studies were reviewed today: (if available, images/films reviewed: From Epic Chart or Care Everywhere) Cardiac MRI May 2021: Normal LV size and thickness.  EF 62%.  WMA (hypokinesis of mid anteroseptal anterior and apical wall along with inferior wall..  Moderate LA dilation.  Mild PA dilation.  MR with trivial TR.   Late enhancement in the mid anteroseptal and anterior wall as well as apical septal anterior and inferior wall.  Findings suggestive of old myocardial infarction in the mid LAD territory.  Otherwise preserved EF.  No evidence of myocarditis.  TTE 07/12/2020: EF 40 to 45%.  Moderate concentric LVH.  GRII DD.  Elevated LAP.  Moderate HK of the mid apical anterior and anteroseptal wall with mild dyskinesis of the apex.  Moderate LA dilation.  Mild MR.  Aortic sclerosis with no stenosis. TTE 01/30/2021: EF 55 to 60%.  Mild LVH.  GR 1 DD.  Elevated LAP.  Moderate LA dilation.  Mild MR with mild MS.  Mild aortic valve stenosis.: 02/20/2021: Cardiac Cath-PCI: Plan was Eliquis plus clopidogrel without aspirin-minimum 6 months dual therapy Stable Severe  native CAD with chronic total occlusion of LAD and RCA LCx calcified 60% ostial lesion - RFR negative (0.96); PCI deferred. LIMA to LAD not visualized as only access was right radial SVG to RCA 40% - confirmed  SVG to D2 recurrent high-grade 90% ISR with 95% lesion in D2 after graft insertion Successful DES PCI of 95% lesion in D2 after graft insertion and recent stent with Onyx Frontier 2.25 mm x 12 mm postdilated to 2.5 mm distally and 2.75 mm at the overlap. Borderline successful PTCA of in-stent restenosis in the body of graft. ->  2.5 mm scoring balloon followed by post dilation with a 2.75 mm [place] balloon   Diagnostic Dominance: Right                                                      Intervention    Inerval History   Katherine [name] is being seen today via telephone conferencing because of multiple questions from her recent hospitalizations.  Unfortunately, she was not scheduled to be seen until early November and had multiple questions. Several more related to medications: Her pharmacist told her that she should not take Eliquis and Plavix, so she decided to hold it for the last 1 week.  She is not sure what she should do. She was not discharged  on Zoloft and is concerned about being tearful and depressed.  Asked if she can go back to taking half the dose which is 100 mg daily. She is happy to not be on Tikosyn based on what she heard in the hospital but is wondering what will be done about her A. fib now.  She is noticing flutters that last anywhere from seconds to minutes.  Not necessarily associated with significant dyspnea more than baseline.   Thankfully, from a chest pain standpoint she has not had any further anginal symptoms since her PCI.  She does have some musculoskeletal side twinges but nothing like her anginal symptoms.  She has baseline dyspnea still losing 3 L of oxygen.  Although she said fluttering, have not been long lasting, but they do make her feel more short of breath.  She feels very weak and tired when they occur as well.  Nothing has been more than a couple minutes, usually lasting only few seconds. She says her swelling is pretty well controlled but she is not sure what to do with her furosemide.  Cardiovascular ROS: positive for - dyspnea on exertion, irregular heartbeat, palpitations, paroxysmal nocturnal dyspnea, shortness of breath, and fatigue and jitteriness.  Insomnia/anxiety negative for - loss of consciousness, orthopnea, paroxysmal nocturnal dyspnea, shortness of breath, or true syncope, TIA/amaurosis fugax   ROS:  Please see the history of present illness.     ROS  Past Medical History:  Diagnosis Date   Anemia    Anxiety    Bilateral carotid artery stenosis    Carotid duplex 02/9389: 3-00% LICA, 92-33% RICA, >00% RECA, f/u 1 yr suggested   CAD (coronary artery disease) of bypass graft 5/01; 3/'02, 8/'03, 10/'04; 1/15   PCI x 5 to SVG-D1    CAD in native artery 07/1993   3 Vessel Disease (LAD-D1 & RCA) -- CABG (Dx in setting of inferior STEMI-PTCA of RCA)   CAD S/P percutaneous coronary angioplasty    PCI to SVG-D1 insertion/native D1 x  4 = '01 -(S660 BMS 2.5 x 9 anastomosis- D1); '02 - distal  overlap ACS Pixel 2.5 x 8  BMS; '03 distal/native ISR/Thrombosis - Pixel 2.5 x 13; '04 - ISR-  Taxus 2.5 x 20 (covered all);; 1/15 - mid SVG-D1 (50% distal ISR) - Promus P 2.75 x 20 -- 2.8 mm   COPD mixed type (HCC)    Followed by Dr. Lamonte Sakai "pulmonologist said no COPD"   Depression    Depression with anxiety    Diabetes mellitus type 2 in obese ([place])    Diarrhea    started after cholecystectomy and mass removed from intestine   Dyslipidemia, goal LDL below 70    08/2012: TC 137, TG 200, HDL 32!, LDL 45; on statin (followed by Dr.Deterding)   ESRD (end stage renal disease) ([place]) 1991   s/p Cadaveric Renal Transplant [facility] - Dr. Jimmy Footman)    Family history of adverse reaction to anesthesia    mom's bp dropped during/after anesthesia   Fibromyalgia    GERD (gastroesophageal reflux disease)    Glomerulonephritis, chronic, rapidly progressive 1989   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 07/1993   Rescue PTCA of RCA -- referred for CABG.   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 Servando Snare: LIMA-LAD, SVG-bifurcatingD1, SVG-rPDA   History of kidney stones    History of stroke 2012   "right eye stroke- half blind now"   Hypertension associated with diabetes ([place])    Mild aortic stenosis by prior echocardiogram 07/2019   Echo:  Mild aortic stenosis (gradients: Mean 14.3 mmHg -peak 24.9 mmHg).   Morbid obesity (HCC)    MRSA (methicillin resistant staph aureus) culture positive    OSA (obstructive sleep apnea)    no longer on CPAP or home O2, states she doesn't need now after lap band   PAD (peripheral artery disease) ([place]) 08/2013   LEA Dopplers to be read by Dr. Fletcher Anon   PAF (paroxysmal atrial fibrillation) ([place]) 06/2014   Noted on CardioNet Monitor  - --> rhythm control with Tikosyn (Dr. Rayann Heman); converted from warfarin to apixaban for anticoagulation.   Pneumonia    Recurrent boils    Bilateral Groin    Rheumatoid arthritis ([place])    Per Patient Report; associated with OA   S/p cadaver renal transplant 1991   [facility]   Unstable angina ([place]) 5/01; 3/'02, 8/'03, 10/'04; 1/15   x 5 occurences since Inf-Post STEMI in 1994   Past Surgical History:  Procedure Laterality Date   ABDOMINAL AORTOGRAM N/A 04/21/2018   Procedure: ABDOMINAL AORTOGRAM;  Surgeon: [name], MD;  Location: [place] [facility];  Service: Cardiovascular;  Laterality: N/A;   CATHETER REMOVAL     CHOLECYSTECTOMY N/A 10/29/2014   Procedure: LAPAROSCOPIC CHOLECYSTECTOMY WITH INTRAOPERATIVE CHOLANGIOGRAM;  Surgeon: Excell Seltzer, MD;  Location: [facility];  Service: General;  Laterality: N/A;   CORONARY ANGIOPLASTY  1994   x5   CORONARY ARTERY BYPASS GRAFT  1995   LIMA-LAD, SVG-RPDA, SVG-D1   CORONARY STENT INTERVENTION N/A 02/20/2021   Procedure: CORONARY STENT INTERVENTION;  Surgeon: [name], MD;  Location: [place] [facility];  Service: Cardiovascular;  Laterality: N/A;   ESOPHAGOGASTRODUODENOSCOPY N/A 10/15/2016   Procedure: ESOPHAGOGASTRODUODENOSCOPY (EGD);  Surgeon: Wilford Corner, MD;  Location: [facility] END[facility];  Service: Endoscopy;  Laterality: N/A;   I & D EXTREMITY Right 01/29/2018   Procedure: IRRIGATION AND  DEBRIDEMENT THUMB;  Surgeon: Dayna Barker, MD;  Location: [place];  Service: Plastics;  Laterality: Right;   INCISE AND DRAIN ABCESS     INTRAVASCULAR PRESSURE WIRE/FFR STUDY N/A 02/20/2021   Procedure: INTRAVASCULAR PRESSURE WIRE/FFR STUDY;  Surgeon: [name], MD;  Location: [place] [facility];  Service: Cardiovascular;  Laterality: N/A;   KIDNEY TRANSPLANT  1991   KNEE ARTHROSCOPY WITH LATERAL MENISECTOMY Left 12/03/2017   Procedure: LEFT KNEE ARTHROSCOPY WITH LATERAL MENISECTOMY;  Surgeon: Earlie Server, MD;  Location: [place];  Service: Orthopedics;  Laterality: Left;   LAPAROSCOPIC GASTRIC BANDING  04/2004; 10/'09, 2/'10   Port Replacement x 2   LEFT HEART CATH AND CORONARY ANGIOGRAPHY N/A 02/20/2021    Procedure: LEFT HEART CATH AND CORONARY ANGIOGRAPHY;  Surgeon: [name], MD;  Location: [place] [facility];  Service: Cardiovascular;  Laterality: N/A;   LEFT HEART CATH AND CORS/GRAFTS ANGIOGRAPHY N/A 04/21/2018   Procedure: LEFT HEART CATH AND CORS/GRAFTS ANGIOGRAPHY;  Surgeon: [name], MD;  Location: [place] [facility];  Ost-Prox LAD 50% - proxLAD (pre & post D1) 100% CTO. Cx - patent, small OM1 (stable ~ ostial OM1 90%, too small for PCI) & 2 small LPL; Ost-distal RCA 100% CTO.  LIMA-LAD (not injected); SVG-dRCA patent, SVG-D1 - insertion stent ~20% ISR - Severe R CFA disease w/ focal Sub TO   LEFT HEART CATH AND CORS/GRAFTS ANGIOGRAPHY  5/'01, 3/'02, 8/'03, 10/'04; 1/'15   08/22/2013: LAD & RCA 100%; LIMA-LAD & SVG-rPDA patent; Cx-- OM1 60%, OM2 ostial ~50%; SVG-D1 - 80% mid, 50% distal ISR --PCI   LEFT HEART CATH AND CORS/GRAFTS ANGIOGRAPHY N/A 01/31/2021   Procedure: LEFT HEART CATH AND CORS/GRAFTS ANGIOGRAPHY;  Surgeon: Jolaine Artist, MD;  Location: [place] [facility];  Service: Cardiovascular;  Laterality: N/A;   LEFT HEART CATHETERIZATION WITH CORONARY/GRAFT ANGIOGRAM N/A 08/23/2013   Procedure: LEFT HEART CATHETERIZATION WITH Beatrix Fetters;  Surgeon: Wellington Hampshire, MD;  Location: [place] CATH LAB;  Service: Cardiovascular;  Laterality: N/A;   Lower Extremity Arterial Dopplers  08/2013   ABI: R 0.96, L 1.04   MULTIPLE TOOTH EXTRACTIONS  age 76   NM MYOVIEW LTD  03/2016   EF 62%. LOW RISK. C/W prior MI - no Ischemia. Apical hypokinesis.   PERCUTANEOUS CORONARY STENT INTERVENTION (PCI-S)  5/'01, 3/'02, 8/'03, 10/'04;   '01 - S660 BMS 2.5 x 9 - dSVG-D1 into D1; '02- post-stent stenosis - 2.5 x 8 Pixel BMS; '8\03: ISR/Thrombosis into native D1 - AngioJet, 2.5 x 13 Pixel; '04 - ISR 95% - covered stented area with Taxus DES 2.5 mm x 20 (2.88)   PERCUTANEOUS CORONARY STENT INTERVENTION (PCI-S)  08/23/2013   Procedure: PERCUTANEOUS CORONARY STENT INTERVENTION (PCI-S);   Surgeon: Wellington Hampshire, MD;  Location: [facility] CATH LAB;  Service: Cardiovascular;;mid SVG-D1 80%; distal stent ~50% ISR; Promus [place] DES 2.75 mm xc 20 mm (2.8 mm)   PORT-A-CATH REMOVAL     kidney   TRANSTHORACIC ECHOCARDIOGRAM  07/2019   EF 55 to 60%.  No LVH.  Paradoxical septal wall motion-post CABG.  GRII DD.  Normal RV size and function.  Mild bilateral atrial dilation.  Moderate MAC.  Trace MR.  Mild aortic stenosis (gradients: Mean 14.3 mmHg -peak 24.9 mmHg).   TUBAL LIGATION     wrist fistula repair Left    dialysis for one year     Current Meds  Medication Sig   acetaminophen (TYLENOL) 500 MG tablet Take 1,500 mg  by mouth 2 (two) times daily.   albuterol (VENTOLIN HFA) 108 (90 Base) MCG/ACT inhaler TAKE 2 PUFFS BY MOUTH EVERY 6 HOURS AS NEEDED FOR WHEEZE OR SHORTNESS OF BREATH   apixaban (ELIQUIS) 5 MG TABS tablet Take 1 tablet (5 mg total) by mouth 2 (two) times daily. APPOINTMENT NEEDED WITH CARDIOLOGIST FOR FURTHER REFILLS (Patient taking differently: Take 5 mg by mouth 2 (two) times daily.)   arformoterol (BROVANA) 15 MCG/2ML NEBU Take 2 mLs (15 mcg total) by nebulization 2 (two) times daily.   azaTHIOprine (IMURAN) 50 MG tablet Take 125 mg by mouth See admin instructions. Take 2 1/2 tablets (125 mg) by mouth daily at 3pm   budesonide (PULMICORT) 0.5 MG/2ML nebulizer solution Take 2 mLs (0.5 mg total) by nebulization 2 (two) times daily.   calcitRIOL (ROCALTROL) 0.25 MCG capsule Take 0.25 mcg by mouth every 3 (three) days.    cetirizine (ZYRTEC) 5 MG tablet Take 5 mg by mouth daily.   clotrimazole (MYCELEX) 10 MG troche Take 1 tablet (10 mg total) by mouth 5 (five) times daily as needed (thrush).   cyclobenzaprine (FLEXERIL) 10 MG tablet TAKE 2 TAB DAILY AT BEDTIME, MAY ALSO TAKE 1 TABLET BY MOUTH AT NOON AS NEEDED FOR MUSCLE SPASMS   fluticasone (FLONASE) 50 MCG/ACT nasal spray PLACE 2 SPRAYS INTO BOTH NOSTRILS 2 TIMES DAILY   insulin aspart (NOVOLOG FLEXPEN) 100 UNIT/ML  FlexPen Inject 14 Units into the skin 3 (three) times daily with meals.   Insulin Glargine (BASAGLAR KWIKPEN) 100 UNIT/ML SOPN Inject 12 Units into the skin daily before breakfast.   isosorbide mononitrate (IMDUR) 60 MG 24 hr tablet Take 1 tablet (60 mg total) by mouth daily.   lamoTRIgine (LAMICTAL) 25 MG tablet TAKE 1 TABLET BY MOUTH EVERYDAY AT BEDTIME   LORazepam (ATIVAN) 1 MG tablet Take 1 tablet (1 mg total) by mouth 2 (two) times daily.   metFORMIN (GLUCOPHAGE) 500 MG tablet Take 1 tablet (500 mg total) by mouth 2 (two) times daily.   metoprolol tartrate (LOPRESSOR) 100 MG tablet Take 1 tablet (100 mg total) by mouth 2 (two) times daily. Appointment Required For Further Refills [phone]   montelukast (SINGULAIR) 10 MG tablet TAKE 1 TABLET BY MOUTH EVERYDAY AT BEDTIME   nitroGLYCERIN (NITROSTAT) 0.4 MG SL tablet Place 1 tablet (0.4 mg total) under the tongue every 5 (five) minutes as needed for chest pain.   Oxycodone HCl 10 MG TABS Take 1 tablet (10 mg total) by mouth 4 (four) times daily as needed.   OXYGEN Inhale 3 L into the lungs continuous.   predniSONE (DELTASONE) 5 MG tablet Take 5 mg by mouth every morning.   promethazine (PHENERGAN) 25 MG tablet Take 25 mg by mouth every 6 (six) hours as needed for nausea or vomiting.   rosuvastatin (CRESTOR) 20 MG tablet TAKE 1 TABLET BY MOUTH EVERY DAY   sertraline (ZOLOFT) 100 MG tablet Take 1 tablet (100 mg total) by mouth daily.     Allergies:   Tetracycline, Niacin, Niaspan [niacin er], Sulfa antibiotics, Sulfonamide derivatives, Codeine, Erythromycin, Hydromorphone hcl, Morphine and related, Nalbuphine, Sulfasalazine, and Tape   Social History   Tobacco Use   Smoking status: Every Day    Packs/day: 1.00    Years: 30.00    Pack years: 30.00    Types: Cigarettes    Last attempt to quit: 08/17/2002    Years since quitting: 18.8   Smokeless tobacco: Never  Vaping Use   Vaping Use: Never used  Substance Use Topics   Alcohol use:  No   Drug use: No     Family Hx: The patient's family history includes Arrhythmia in her brother and paternal aunt; Cancer in her father and mother; Heart disease in her father.   Labs/Other Tests and Data Reviewed:    EKG:  No ECG reviewed.  Recent Labs: 01/29/2021: B Natriuretic Peptide 352.2; TSH 1.176 05/13/2021: ALT 10 05/16/2021: Magnesium 2.0 05/18/2021: BUN 10; Creatinine, Ser 0.90; Hemoglobin 8.7; Platelets 322; Potassium 4.0; Sodium 133   Recent Lipid Panel Lab Results  Component Value Date/Time   CHOL 99 02/20/2021 01:25 AM   TRIG 165 (H) 02/20/2021 01:25 AM   HDL 51 02/20/2021 01:25 AM   CHOLHDL 1.9 02/20/2021 01:25 AM   LDLCALC 15 02/20/2021 01:25 AM    Wt Readings from Last 3 Encounters:  05/18/21 211 lb 10.3 oz (96 kg)  04/22/21 213 lb 13.5 oz (97 kg)  04/12/21 200 lb (90.7 kg)     Objective:    Vital Signs:  BP (!) 161/99   Pulse 88   Ht _0  (1.549 m)   SpO2 98%   BMI 39.99 kg/m   VITAL SIGNS:  reviewed GEN:  no acute distress RESPIRATORY:  - nonlabored NEURO:  -Alert and oriented x3   ==========================================  COVID-19 Education: The signs and symptoms of COVID-19 were discussed with the patient and how to seek care for testing (follow up with PCP or arrange E-visit).   The importance of social distancing was discussed today.  Time:   I spent a total of 65 minutes with the patient spent in direct patient consultation.  Additional time spent with chart review  / charting (studies, outside notes, etc): 65 min Total Time: 125 min   Current medicines are reviewed at length with the patient today.  (+/- concerns) multiple?s --> Do I restart Plavix?-Was told by her pharmacist that she should not take the Plavix with Eliquis.  As result, she stopped taking it a week ago.; do I restart Tikosyn?: Can I restart Zoloft?   Medication Adjustments/Labs and Tests Ordered: Current medicines are reviewed at length with the patient today.   Concerns regarding medicines are outlined above.   Patient Instructions  Medication Instructions:   Restart taking  Clopidogrel 75 mg   the first 2 days take 2 tablet  daily at the same time , then on the third day start one tablet daily  Continue medication until Aug 16, 2021. Stop taking Aug 17, 2021.   Start taking 100 mg Zoloft ( sertraline )  at bedtime   *If you need a refill on your cardiac medications before your next appointment, please call your pharmacy*   Lab Work: CMP Magnesium  If you have labs (blood work) drawn today and your tests are completely normal, you will receive your results only by: [place] (if you have MyChart) OR A paper copy in the mail If you have any lab test that is abnormal or we need to change your treatment, we will call you to review the results.   Testing/Procedures:  Will be mailed to you - take 5 to 10 days Your physician has recommended that you wear a holter monitor 14 days Zio . Holter monitors are medical devices that record the heart's electrical activity. Doctors most often use these monitors to diagnose arrhythmias. Arrhythmias are problems with the speed or rhythm of the heartbeat. The monitor is a small, portable device. You can wear one while you  do your normal daily activities. This is usually used to diagnose what is causing palpitations/syncope (passing out).   Follow-Up: At [facility], you and your health needs are our priority.  As part of our continuing mission to provide you with exceptional heart care, we have created designated Provider Care Teams.  These Care Teams include your primary Cardiologist (physician) and Advanced Practice Providers (APPs -  Physician Assistants and Nurse Practitioners) who all work together to provide you with the care you need, when you need it.     Your next appointment:   4 month(s) Feb 17 ,2023   The format for your next appointment:   In Person  Provider:   [name],  MD   Other Instructions   Your physician recommends that you schedule a follow-up appointment in:  [facility] -  to  go over medications  Nov 29 at 2:30pm -- bring all your medication you are taking .     Signed, [name], MD  06/06/2021 5:51 PM    [place]

## 2021-06-06 NOTE — Assessment & Plan Note (Signed)
Unfortunate this has become a difficult issue with her because she has significant GI symptoms at different interval depending on what is being done with the Lap-Band.  She has a tendency to have nausea and vomiting and diarrhea depending on what is being done.  As such, with potential derangements in electrolytes, I think that probably Tikosyn may not be a great option given her recent hospitalization for torsades.

## 2021-06-06 NOTE — Assessment & Plan Note (Signed)
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  -Check 2-week Zio patch monitor.

## 2021-06-06 NOTE — Patient Instructions (Addendum)
Medication Instructions:   Restart taking  Clopidogrel 75 mg   the first 2 days take 2 tablet  daily at the same time , then on the third day start one tablet daily  Continue medication until Aug 16, 2021. Stop taking Aug 17, 2021.   Start taking 100 mg Zoloft ( sertraline )  at bedtime   *If you need a refill on your cardiac medications before your next appointment, please call your pharmacy*   Lab Work: CMP Magnesium  If you have labs (blood work) drawn today and your tests are completely normal, you will receive your results only by: [place] (if you have MyChart) OR A paper copy in the mail If you have any lab test that is abnormal or we need to change your treatment, we will call you to review the results.   Testing/Procedures:  Will be mailed to you - take 5 to 10 days Your physician has recommended that you wear a holter monitor 14 days Zio . Holter monitors are medical devices that record the heart's electrical activity. Doctors most often use these monitors to diagnose arrhythmias. Arrhythmias are problems with the speed or rhythm of the heartbeat. The monitor is a small, portable device. You can wear one while you do your normal daily activities. This is usually used to diagnose what is causing palpitations/syncope (passing out).   Follow-Up: At [facility], you and your health needs are our priority.  As part of our continuing mission to provide you with exceptional heart care, we have created designated Provider Care Teams.  These Care Teams include your primary Cardiologist (physician) and Advanced Practice Providers (APPs -  Physician Assistants and Nurse Practitioners) who all work together to provide you with the care you need, when you need it.     Your next appointment:   4 month(s) Feb 17 ,2023   The format for your next appointment:   In Person  Provider:   [name], MD   Other Instructions   Your physician recommends that you schedule a  follow-up appointment in:  [facility] -  to  go over medications  Nov 29 at 2:30pm -- bring all your medication you are taking .

## 2021-06-06 NOTE — Progress Notes (Unsigned)
Enrolled patient for a 14 day Zio XT monitor to be mailed to patients [facility]  [address] [place] [id number]

## 2021-06-06 NOTE — Progress Notes (Deleted)
Primary Care Provider: [name], MD Cardiologist: [name], MD Electrophysiologist: None Nephrologist: Johnney Ou  Clinic Note: Chief Complaint  Patient presents with   Hospitalization Follow-up    Has had several hospitalizations, most recently for syncope with torsades/PMT.   Coronary Artery Disease    I have not yet seen her since her PCI.  Not having any angina since then.   Atrial Fibrillation    She is starting to have some flutter sensations.  Dofetilide was discontinued after her most recent hospitalization with concerns for QT prolongation.    ===================================  ASSESSMENT/PLAN   Problem List Items Addressed This Visit       Cardiology Problems   CAD S/P percutaneous coronary angioplasty - PCI x 5 to SVG-D1 (Chronic)   Relevant Orders   Comprehensive metabolic panel   Coronary artery disease involving native coronary artery of native heart with angina pectoris (HCC) (Chronic)   Relevant Orders   Comprehensive metabolic panel   Magnesium   Dyslipidemia, goal LDL below 70 (Chronic)   Paroxysmal atrial fibrillation (HCC); CHA2DS2VASc score F, HTN, CAD, CVA = 5 (Chronic)   Relevant Orders   Comprehensive metabolic panel   Magnesium   LONG TERM MONITOR (3-14 DAYS)   Polymorphic ventricular tachycardia   Relevant Orders   Comprehensive metabolic panel   Magnesium   LONG TERM MONITOR (3-14 DAYS)   Hypertension associated with diabetes (HCC) (Chronic)   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 - Primary (Chronic)   Relevant Orders   Comprehensive metabolic panel   Magnesium   Chronic diastolic CHF (congestive heart failure), NYHA class 2 (HCC) (Chronic)   Relevant Orders   Comprehensive metabolic panel   Magnesium     Other   Fluttering heart   Relevant Orders   Comprehensive metabolic panel   Magnesium   LONG TERM MONITOR (3-14 DAYS)   Severe episode of recurrent major depressive disorder, with  psychotic features (HCC)   Relevant Medications   sertraline (ZOLOFT) 100 MG tablet   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 female with a PMH below who presents today for hospital follow-up.  Kerigan Narvaez is a former patient of Dr. Terance Ice with a history of CAD-CABG followed by PCI as well as PAD/carotid disease and PAF.   CARDIAC HISTORY:  CAD: 1994 -->  Inferior-posterior STEMI-> 3V CAD: LAD-D1 bifurcation & RCA -> emergent PTCA of infarct related artery (RCA) -->  eventually went for for CABGx3: LIMA-LAD, SVG-D1, SVG-RPDA. Multiple Caths: 2001, 2002, 2003 than 2004 all with lesions in the SVG-D1.  Initial stent followed by's are followed by stent thrombosis.. Cath-PCI for UA was in January 2015: Native LAD and RCA 100% occluded.  LIMA-LAD and SVG-RPDA patent.  LCx - OM1 60% & ost OM 2 50% with normal OM 3.  SVG-D1 had 80% mid and 50% distal ISR --> 1- PCI with a Promus Premier DES 2.75 mm x 20 mm. Myoview August 2017 for preop: EF 60%.  Small anteroapical infarct but no ischemia.  Mild apical hypokinesis.  EF 62%.  LOW RISK Cath 04/2018 - stable findings; RCA & LAD-TO, Stable small OM1 90%. SVG-D1 ~  20% distal insertion Stent ISR (otw patent), Patent SVG-dRCA (did not see LIMA). R CFA subtotal stenosis.  This was for recurrent Afib RVR --> had Angina-type pain with Afib EP consult - 9/23 - Tikosyn load.;  CHA2DS2Vasc Score (HTN, DM, F, CAD, CVA = 6) =>Warfarin. -> converted to Eliquis Echo 07/2019: CHA2DS2VASc EF 55 to 60%.  No LVH.  Paradoxical septal wall motion-post CABG.  GRII DD.  Normal RV size and function.  Mild bilateral atrial dilation.   Moderate MAC.  Trace MR.  Mild aortic stenosis (gradients: Mean 14.3 mmHg -peak 24.9 mmHg).  Khaya Theissen was last seen in the clinic by me on December 06, 2019 -> this was a delayed follow-up (she has a tendency to miss visits).  She was having occasional squeezing in her chest with some left arm discomfort and tingling.  Happens with and without exertion.  Often occur occurred when she is tired or pushing her self.  Was happy on Eliquis.  Had switched from Tegretol to Lamictal.  There was concern about interaction.  Had not had any recurrence of A. fib.  No PND orthopnea.  Baseline dyspnea from deconditioning and arthritis pains.  Recent Hospitalizations:  01/28/2021 02/19/2021 03/21/2021 03/30/2021 ER visit 04/12/2021 04/19/2021 05/11/2021  Reviewed  CV studies:    The following studies were reviewed today: (if available, images/films reviewed: From Epic Chart or Care Everywhere) TTE 07/12/2020: EF 40 to 45%.  Moderate concentric LVH.  GRII DD.  Elevated LAP.  Moderate HK of the mid apical anterior and anteroseptal wall with mild dyskinesis of the apex.  Moderate LA dilation.  Mild MR.  Aortic sclerosis with no stenosis. TTE 01/30/2021: EF 55 to 60%.  Mild LVH.  GR 1 DD.  Elevated LAP.  Moderate LA dilation.  Mild MR with mild MS.  Mild aortic valve stenosis.: 02/20/2021: Cardiac Cath-PCI: Plan was Eliquis plus clopidogrel without aspirin-minimum 6 months dual therapy Stable Severe native CAD with chronic total occlusion of LAD and RCA LCx calcified 60% ostial lesion - RFR negative (0.96); PCI deferred. LIMA to LAD not visualized as only access was right radial SVG to RCA 40% - confirmed  SVG to D2 recurrent high-grade 90% ISR with 95% lesion in D2 after graft insertion Successful DES PCI of 95% lesion in D2 after graft insertion and recent stent with Onyx Frontier 2.25 mm x 12 mm postdilated to 2.5 mm distally and 2.75 mm at the overlap. Borderline successful PTCA of in-stent restenosis in the  body of graft. ->  2.5 mm scoring balloon followed by post dilation with a 2.75 mm [place] balloon  Diagnostic Dominance: Right     Intervention   Interval History:   [name]   CV Review of Symptoms (Summary) Cardiovascular ROS: {roscv:310661}  REVIEWED OF SYSTEMS   ROS  I have reviewed and (if needed) personally updated the patient's problem list, medications, allergies, past medical and surgical history, social and family history.   PAST MEDICAL HISTORY   Past Medical History:  Diagnosis Date   Anemia    Anxiety    Bilateral carotid artery stenosis    Carotid duplex 01/736: 1-06% LICA, 26-94% RICA, >85% RECA, f/u 1 yr suggested   CAD (coronary artery disease) of bypass graft 5/01; 3/'02, 8/'03, 10/'04; 1/15   PCI x 5 to SVG-D1    CAD in native artery 07/1993   3 Vessel Disease (LAD-D1 & RCA) -- CABG (Dx in setting of inferior STEMI-PTCA of RCA)   CAD S/P  percutaneous coronary angioplasty    PCI to SVG-D1 insertion/native D1 x 4 = '01 -(S660 BMS 2.5 x 9 anastomosis- D1); '02 - distal overlap ACS Pixel 2.5 x 8  BMS; '03 distal/native ISR/Thrombosis - Pixel 2.5 x 13; '04 - ISR-  Taxus 2.5 x 20 (covered all);; 1/15 - mid SVG-D1 (50% distal ISR) - Promus P 2.75 x 20 -- 2.8 mm   COPD mixed type (HCC)    Followed by Dr. Lamonte Sakai "pulmonologist said no COPD"   Depression    Depression with anxiety    Diabetes mellitus type 2 in obese ([place])    Diarrhea    started after cholecystectomy and mass removed from intestine   Dyslipidemia, goal LDL below 70    08/2012: TC 137, TG 200, HDL 32!, LDL 45; on statin (followed by Dr.Deterding)   ESRD (end stage renal disease) ([place]) 1991   s/p Cadaveric Renal Transplant [facility] - Dr. Jimmy Footman)    Family history of adverse reaction to anesthesia    mom's bp dropped during/after anesthesia   Fibromyalgia    GERD (gastroesophageal reflux disease)    Glomerulonephritis, chronic, rapidly progressive 1989   H/O ST elevation myocardial infarction  (STEMI) of inferoposterior wall 07/1993   Rescue PTCA of RCA -- referred for CABG.   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 Servando Snare: LIMA-LAD, SVG-bifurcatingD1, SVG-rPDA   History of kidney stones    History of stroke 2012   "right eye stroke- half blind now"   Hypertension associated with diabetes ([place])    Mild aortic stenosis by prior echocardiogram 07/2019   Echo:  Mild aortic stenosis (gradients: Mean 14.3 mmHg -peak 24.9 mmHg).   Morbid obesity (HCC)    MRSA (methicillin resistant staph aureus) culture positive    OSA (obstructive sleep apnea)    no longer on CPAP or home O2, states she doesn't need now after lap band   PAD (peripheral artery disease) ([place]) 08/2013   LEA Dopplers to be read by Dr. Fletcher Anon   PAF (paroxysmal atrial fibrillation) ([place]) 06/2014   Noted on CardioNet Monitor  - --> rhythm control with Tikosyn (Dr. Rayann Heman); converted from warfarin to apixaban for anticoagulation.   Pneumonia    Recurrent boils    Bilateral Groin   Rheumatoid arthritis ([place])    Per Patient Report; associated with OA   S/p cadaver renal transplant 1991   [place]   Unstable angina ([place]) 5/01; 3/'02, 8/'03, 10/'04; 1/15   x 5 occurences since Inf-Post STEMI in 1994    PAST SURGICAL HISTORY   Past Surgical History:  Procedure Laterality Date   ABDOMINAL AORTOGRAM N/A 04/21/2018   Procedure: ABDOMINAL AORTOGRAM;  Surgeon: [name], MD;  Location: [place] [facility];  Service: Cardiovascular;  Laterality: N/A;   CATHETER REMOVAL     CHOLECYSTECTOMY N/A 10/29/2014   Procedure: LAPAROSCOPIC CHOLECYSTECTOMY WITH INTRAOPERATIVE CHOLANGIOGRAM;  Surgeon: Excell Seltzer, MD;  Location: [facility];  Service: General;  Laterality: N/A;   CORONARY ANGIOPLASTY  1994   x5   CORONARY ARTERY BYPASS GRAFT  1995   LIMA-LAD, SVG-RPDA, SVG-D1   CORONARY STENT INTERVENTION N/A 02/20/2021   Procedure: CORONARY STENT INTERVENTION;  Surgeon: [name], MD;  Location: [place] [facility];  Service: Cardiovascular;  Laterality: N/A;   ESOPHAGOGASTRODUODENOSCOPY N/A 10/15/2016   Procedure: ESOPHAGOGASTRODUODENOSCOPY (EGD);  Surgeon: Wilford Corner, MD;  Location: [facility] ENDOSCOPY;  Service: Endoscopy;  Laterality: N/A;   I & D EXTREMITY Right 01/29/2018   Procedure: IRRIGATION AND DEBRIDEMENT THUMB;  Surgeon: Dayna Barker, MD;  Location: [place];  Service: Plastics;  Laterality: Right;   INCISE AND DRAIN ABCESS     INTRAVASCULAR PRESSURE WIRE/FFR STUDY N/A 02/20/2021   Procedure: INTRAVASCULAR PRESSURE WIRE/FFR STUDY;  Surgeon: [name], MD;  Location: [place] [facility];  Service: Cardiovascular;  Laterality: N/A;   KIDNEY TRANSPLANT  1991   KNEE ARTHROSCOPY WITH LATERAL MENISECTOMY Left 12/03/2017   Procedure: LEFT KNEE ARTHROSCOPY WITH LATERAL MENISECTOMY;  Surgeon: Earlie Server, MD;  Location: [place];  Service: Orthopedics;  Laterality: Left;   LAPAROSCOPIC GASTRIC BANDING  04/2004; 10/'09, 2/'10   Port Replacement x 2   LEFT HEART CATH AND CORONARY ANGIOGRAPHY N/A 02/20/2021   Procedure: LEFT HEART CATH AND CORONARY ANGIOGRAPHY;  Surgeon: [name], MD;  Location: [place] [facility];  Service: Cardiovascular;  Laterality: N/A;   LEFT HEART CATH AND CORS/GRAFTS ANGIOGRAPHY N/A 04/21/2018   Procedure: LEFT HEART CATH AND CORS/GRAFTS ANGIOGRAPHY;  Surgeon: [name], MD;  Location: [place] [facility];  Ost-Prox LAD 50% - proxLAD (pre & post D1) 100% CTO. Cx - patent, small OM1 (stable ~ ostial OM1 90%, too small for PCI) & 2 small LPL; Ost-distal RCA 100% CTO.  LIMA-LAD (not injected); SVG-dRCA patent, SVG-D1 - insertion stent ~20% ISR - Severe R CFA disease w/ focal Sub TO   LEFT HEART CATH AND CORS/GRAFTS ANGIOGRAPHY  5/'01, 3/'02, 8/'03, 10/'04; 1/'15   08/22/2013: LAD & RCA 100%; LIMA-LAD & SVG-rPDA patent; Cx-- OM1 60%, OM2 ostial ~50%; SVG-D1 - 80% mid, 50% distal ISR --PCI   LEFT HEART CATH AND CORS/GRAFTS ANGIOGRAPHY N/A  01/31/2021   Procedure: LEFT HEART CATH AND CORS/GRAFTS ANGIOGRAPHY;  Surgeon: Jolaine Artist, MD;  Location: [place] [facility];  Service: Cardiovascular;  Laterality: N/A;   LEFT HEART CATHETERIZATION WITH CORONARY/GRAFT ANGIOGRAM N/A 08/23/2013   Procedure: LEFT HEART CATHETERIZATION WITH Beatrix Fetters;  Surgeon: Wellington Hampshire, MD;  Location: [place] CATH LAB;  Service: Cardiovascular;  Laterality: N/A;   Lower Extremity Arterial Dopplers  08/2013   ABI: R 0.96, L 1.04   MULTIPLE TOOTH EXTRACTIONS  age 24   NM MYOVIEW LTD  03/2016   EF 62%. LOW RISK. C/W prior MI - no Ischemia. Apical hypokinesis.   PERCUTANEOUS CORONARY STENT INTERVENTION (PCI-S)  5/'01, 3/'02, 8/'03, 10/'04;   '01 - S660 BMS 2.5 x 9 - dSVG-D1 into D1; '02- post-stent stenosis - 2.5 x 8 Pixel BMS; '8\03: ISR/Thrombosis into native D1 - AngioJet, 2.5 x 13 Pixel; '04 - ISR 95% - covered stented area with Taxus DES 2.5 mm x 20 (2.88)   PERCUTANEOUS CORONARY STENT INTERVENTION (PCI-S)  08/23/2013   Procedure: PERCUTANEOUS CORONARY STENT INTERVENTION (PCI-S);  Surgeon: Wellington Hampshire, MD;  Location: [facility] CATH LAB;  Service: Cardiovascular;;mid SVG-D1 80%; distal stent ~50% ISR; Promus [place] DES 2.75 mm xc 20 mm (2.8 mm)   PORT-A-CATH REMOVAL     kidney   TRANSTHORACIC ECHOCARDIOGRAM  07/2019   EF 55 to 60%.  No LVH.  Paradoxical septal wall motion-post CABG.  GRII DD.  Normal RV size and function.  Mild bilateral atrial dilation.  Moderate MAC.  Trace MR.  Mild aortic stenosis (gradients: Mean 14.3 mmHg -peak 24.9 mmHg).   TUBAL LIGATION     wrist fistula repair Left    dialysis for one year  Immunization History  Administered Date(s) Administered   Influenza Split 05/11/2012, 05/17/2016, 05/17/2017   Influenza Whole 06/17/2009, 06/17/2010, 04/18/2011   Influenza,inj,Quad PF,6+ Mos 08/24/2013, 05/20/2015, 04/21/2018, 04/01/2019, 08/13/2020, 05/13/2021   PFIZER Comirnaty(Gray Top)Covid-19 Tri-Sucrose Vaccine  12/30/2019   Pneumococcal Conjugate-13 08/13/2020   Pneumococcal Polysaccharide-23 11/30/2017   Tdap 11/30/2017   Zoster Recombinat (Shingrix) 07/04/2018, 03/08/2019    MEDICATIONS/ALLERGIES   Current Meds  Medication Sig   acetaminophen (TYLENOL) 500 MG tablet Take 1,500 mg by mouth 2 (two) times daily.   albuterol (VENTOLIN HFA) 108 (90 Base) MCG/ACT inhaler TAKE 2 PUFFS BY MOUTH EVERY 6 HOURS AS NEEDED FOR WHEEZE OR SHORTNESS OF BREATH   apixaban (ELIQUIS) 5 MG TABS tablet Take 1 tablet (5 mg total) by mouth 2 (two) times daily. APPOINTMENT NEEDED WITH CARDIOLOGIST FOR FURTHER REFILLS (Patient taking differently: Take 5 mg by mouth 2 (two) times daily.)   arformoterol (BROVANA) 15 MCG/2ML NEBU Take 2 mLs (15 mcg total) by nebulization 2 (two) times daily.   azaTHIOprine (IMURAN) 50 MG tablet Take 125 mg by mouth See admin instructions. Take 2 1/2 tablets (125 mg) by mouth daily at 3pm   budesonide (PULMICORT) 0.5 MG/2ML nebulizer solution Take 2 mLs (0.5 mg total) by nebulization 2 (two) times daily.   calcitRIOL (ROCALTROL) 0.25 MCG capsule Take 0.25 mcg by mouth every 3 (three) days.    cetirizine (ZYRTEC) 5 MG tablet Take 5 mg by mouth daily.   clotrimazole (MYCELEX) 10 MG troche Take 1 tablet (10 mg total) by mouth 5 (five) times daily as needed (thrush).   cyclobenzaprine (FLEXERIL) 10 MG tablet TAKE 2 TAB DAILY AT BEDTIME, MAY ALSO TAKE 1 TABLET BY MOUTH AT NOON AS NEEDED FOR MUSCLE SPASMS   isosorbide mononitrate (IMDUR) 60 MG 24 hr tablet Take 1 tablet (60 mg total) by mouth daily.   lamoTRIgine (LAMICTAL) 25 MG tablet TAKE 1 TABLET BY MOUTH EVERYDAY AT BEDTIME   LORazepam (ATIVAN) 1 MG tablet Take 1 tablet (1 mg total) by mouth 2 (two) times daily.   metFORMIN (GLUCOPHAGE) 500 MG tablet Take 1 tablet (500 mg total) by mouth 2 (two) times daily.   metoprolol tartrate (LOPRESSOR) 100 MG tablet Take 1 tablet (100 mg total) by mouth 2 (two) times daily. Appointment Required For  Further Refills [phone]   montelukast (SINGULAIR) 10 MG tablet TAKE 1 TABLET BY MOUTH EVERYDAY AT BEDTIME   nitroGLYCERIN (NITROSTAT) 0.4 MG SL tablet Place 1 tablet (0.4 mg total) under the tongue every 5 (five) minutes as needed for chest pain.   Oxycodone HCl 10 MG TABS Take 1 tablet (10 mg total) by mouth 4 (four) times daily as needed.   OXYGEN Inhale 3 L into the lungs continuous.   predniSONE (DELTASONE) 5 MG tablet Take 5 mg by mouth every morning.   rosuvastatin (CRESTOR) 20 MG tablet TAKE 1 TABLET BY MOUTH EVERY DAY  ? Glimeperide   Allergies  Allergen Reactions   Tetracycline Hives    Patient tolerated Doxycycline Dec 2020   Niacin Other (See Comments)    Mouth blisters   Niaspan [Niacin Er] Other (See Comments)    Mouth blisters   Sulfa Antibiotics Nausea Only and Other (See Comments)    "Tears up stomach"   Sulfonamide Derivatives Other (See Comments)    Reaction: per patient "tears her stomach up"   Codeine Nausea And Vomiting   Erythromycin Nausea And Vomiting   Hydromorphone Hcl Nausea And Vomiting   Morphine And Related Nausea And  Vomiting   Nalbuphine Nausea And Vomiting    Nubain   Sulfasalazine Nausea Only and Other (See Comments)    per patient "tears her stomach up", "Tears up stomach"   Tape Rash and Other (See Comments)    No "plastic" tape," please----cloth tape only    SOCIAL HISTORY/FAMILY HISTORY   Reviewed in Epic:  Pertinent findings:  Social History   Tobacco Use   Smoking status: Every Day    Packs/day: 1.00    Years: 30.00    Pack years: 30.00    Types: Cigarettes    Last attempt to quit: 08/17/2002    Years since quitting: 18.8   Smokeless tobacco: Never  Vaping Use   Vaping Use: Never used  Substance Use Topics   Alcohol use: No   Drug use: No   Social History   Social History Narrative   ** Merged History Encounter ** She is currently married, and the caregiver of her husband who is recovering from surgery for tongue  cancer now diagnosed with lung cancer. Prior to his diagnosis of her husband, she actually had adopted a 54-year-old child who she knows caring for as well. With all the surgeries, they have been quite financially troubled. Thanks the help of her community and church, they have been able to stay "alfoat."     She is a former smoker who quit in 2004 after a 30-pack-year history.   She is active chasing a 49-year-old child, does not do routine exercise. She's been quite depressed with the condition of her husband, and admits to eating comfort herself.   She does not drink alcohol.      05/13/2021 Patient reports that her husband is deceased. She is grieving the loss of her husband and the decline in her physical independence.     OBJCTIVE -PE, EKG, labs   Wt Readings from Last 3 Encounters:  05/18/21 211 lb 10.3 oz (96 kg)  04/22/21 213 lb 13.5 oz (97 kg)  04/12/21 200 lb (90.7 kg)    Physical Exam: BP (!) 161/99   Pulse 88   Ht _0  (1.549 m)   SpO2 98%   BMI 39.99 kg/m  Physical Exam   Adult ECG Report  Rate: *** ;  Rhythm: {rhythm:17366};   Narrative Interpretation: ***  Recent Labs:  ***  Lab Results  Component Value Date   CHOL 99 02/20/2021   HDL 51 02/20/2021   LDLCALC 15 02/20/2021   TRIG 165 (H) 02/20/2021   CHOLHDL 1.9 02/20/2021   Lab Results  Component Value Date   CREATININE 0.90 05/18/2021   BUN 10 05/18/2021   NA 133 (L) 05/18/2021   K 4.0 05/18/2021   CL 100 05/18/2021   CO2 26 05/18/2021   CBC Latest Ref Rng & Units 05/18/2021 05/17/2021 05/16/2021  WBC 4.0 - 10.5 K/uL 4.9 4.7 4.5  Hemoglobin 12.0 - 15.0 g/dL 8.7(L) 9.0(L) 9.4(L)  Hematocrit 36.0 - 46.0 % 27.8(L) 28.5(L) 29.9(L)  Platelets 150 - 400 K/uL 322 286 277    Lab Results  Component Value Date   HGBA1C 6.9 (H) 05/12/2021   Lab Results  Component Value Date   TSH 1.176 01/29/2021    ==================================================  COVID-19 Education: The signs and symptoms of  COVID-19 were discussed with the patient and how to seek care for testing (follow up with PCP or arrange E-visit).    I spent a total of 65 minutes with the patient spent in direct patient consultation.  Additional time  spent with chart review  / charting (studies, outside notes, etc): 65 min Total Time: 125 min  Current medicines are reviewed at length with the patient today.  (+/- concerns) multiple?s --> Do I restart Plavix?-Was told by her pharmacist that she should not take the Plavix with Eliquis.  As result, she stopped taking it a week ago.; do I restart Tikosyn?: Can I restart Zoloft?  This visit occurred during the SARS-CoV-2 public health emergency.  Safety protocols were in place, including screening questions prior to the visit, additional usage of staff PPE, and extensive cleaning of exam room while observing appropriate contact time as indicated for disinfecting solutions.  Notice: This dictation was prepared with Dragon dictation along with smart phrase technology. Any transcriptional errors that result from this process are unintentional and may not be corrected upon review.  Patient Instructions / Medication Changes & Studies & Tests Ordered   Patient Instructions  Medication Instructions:   Restart taking  Clopidogrel 75 mg   the first 2 days take 2 tablet  daily at the same time , then on the third day start one tablet daily  Continue medication until Aug 16, 2021. Stop taking Aug 17, 2021.   Start taking 100 mg Zoloft ( sertraline )  at bedtime   *If you need a refill on your cardiac medications before your next appointment, please call your pharmacy*   Lab Work: CMP Magnesium  If you have labs (blood work) drawn today and your tests are completely normal, you will receive your results only by: [place] (if you have MyChart) OR A paper copy in the mail If you have any lab test that is abnormal or we need to change your treatment, we will call you to review  the results.   Testing/Procedures:  Will be mailed to you - take 5 to 10 days Your physician has recommended that you wear a holter monitor 14 days Zio . Holter monitors are medical devices that record the heart's electrical activity. Doctors most often use these monitors to diagnose arrhythmias. Arrhythmias are problems with the speed or rhythm of the heartbeat. The monitor is a small, portable device. You can wear one while you do your normal daily activities. This is usually used to diagnose what is causing palpitations/syncope (passing out).   Follow-Up: At [facility], you and your health needs are our priority.  As part of our continuing mission to provide you with exceptional heart care, we have created designated Provider Care Teams.  These Care Teams include your primary Cardiologist (physician) and Advanced Practice Providers (APPs -  Physician Assistants and Nurse Practitioners) who all work together to provide you with the care you need, when you need it.     Your next appointment:   4 month(s) Feb 17 ,2023   The format for your next appointment:   In Person  Provider:   [name], MD   Other Instructions   Your physician recommends that you schedule a follow-up appointment in:  [facility] -  to  go over medications  Nov 29 at 2:30pm -- bring all your medication you are taking .    Studies Ordered:   Orders Placed This Encounter  Procedures   Comprehensive metabolic panel   Magnesium   LONG TERM MONITOR (3-14 DAYS)     [name], M.D., M.S. Interventional Cardiologist   Pager # [phone] Phone # [phone] [address] [place], [place] [address]   Thank you for choosing [facility]!!

## 2021-06-06 NOTE — Assessment & Plan Note (Signed)
Hopefully she is maintaining sinus rhythm.  No longer on Tikosyn having stopped during recent hospitalization for torsade de pointes.  Also diltiazem was discontinued and she is only on metoprolol for rate control along with Eliquis for anticoagulation.  Plan: Continue rate control with metoprolol and Eliquis for DOAC.  I suspect with her diastolic dysfunction and that she would likely have diastolic heart failure symptoms if she were to recur in A. fib.  As such, would like to confirm that her recent palpitation symptoms with dizziness and dyspnea or not recurrence of A. fib now that she is off Tikosyn.  Not a lot of other good options, but clearly with recent torsades we would not restart Tikosyn.  Check 2-week monitor followed by chemistries.  We will then have her follow-up with our CV[facility] clinical pharmacist to review her medications to ensure there are no further interactions.  Pending result of monitor, may refer directly to electrophysiology for assistance in rate versus rhythm control options.  Not many antiarrhythmics are good choices.

## 2021-06-06 NOTE — Progress Notes (Signed)
Virtual Visit via Telephone Note   This visit type was conducted due to national recommendations for restrictions regarding the COVID-19 Pandemic (e.g. social distancing) in an effort to limit this patient's exposure and mitigate transmission in our community.  Due to her co-morbid illnesses, this patient is at least at moderate risk for complications without adequate follow up.  This format is felt to be most appropriate for this patient at this time.  The patient did not have access to video technology/had technical difficulties with video requiring transitioning to audio format only (telephone).  All issues noted in this document were discussed and addressed.  No physical exam could be performed with this format.  Please refer to the patient's chart for her  consent to telehealth for [facility].   Patient has given verbal permission to conduct this visit via virtual appointment and to bill insurance 06/06/2021 9:38 PM     Evaluation Performed:  Follow-up visit  Date:  06/06/2021   ID:  Katherine [name], DOB 27-Aug-1955, MRN [id number]  Patient Location: Home Provider Location: Office/Clinic  PCP:  [name], MD  Cardiologist:  [name], MD  Electrophysiologist:  None   Chief Complaint:   Chief Complaint  Patient presents with   Hospitalization Follow-up    Has had several hospitalizations, most recently for syncope with torsades/PMT.   Coronary Artery Disease    I have not yet seen her since her PCI.  Not having any angina since then.   Atrial Fibrillation    She is starting to have some flutter sensations.  Dofetilide was discontinued after her most recent hospitalization with concerns for QT prolongation.    ====================================  ASSESSMENT & PLAN:    Problem List Items Addressed This Visit       Cardiology Problems   CAD S/P percutaneous coronary angioplasty - PCI x 5 to SVG-D1 (Chronic)    She has multiple stents and now had a new stent  with significant angioplasty of in-stent restenosis in the vein graft to the diagonal.  The plan was to do 6 months of Plavix plus Eliquis.  I think her pharmacist overstep bounds a little bit since she is relatively close to her PCI.  Recommend that she goes back on Plavix and complete total 6 months which would be through the end of December 2022.  States she has not been taking it for a week, she will start with 150 mg daily for 2 days and then go back to 75 mg daily.  After January 1, she will simply continue with Eliquis alone      Relevant Orders   Comprehensive metabolic panel   Coronary artery disease involving native coronary artery of native heart with angina pectoris ([place]) - Primary (Chronic)   Relevant Orders   Comprehensive metabolic panel   Magnesium   Dyslipidemia, goal LDL below 70 (Chronic)    She is on rosuvastatin.  Had lipids just checked in July.  LDL is dramatically reduced.  I think is probably partly related to her recent hospitalizations.  And the lack of any active symptoms, present to continue, but if there is concern about fatigue would consider a statin holiday for least a month to reassess.      Paroxysmal atrial fibrillation (HCC); CHA2DS2VASc score F, HTN, CAD, CVA = 5 (Chronic)    Hopefully she is maintaining sinus rhythm.  No longer on Tikosyn having stopped during recent hospitalization for torsade de pointes.  Also diltiazem was discontinued and she  is only on metoprolol for rate control along with Eliquis for anticoagulation.  Plan: Continue rate control with metoprolol and Eliquis for DOAC. I suspect with her diastolic dysfunction and that she would likely have diastolic heart failure symptoms if she were to recur in A. fib.  As such, would like to confirm that her recent palpitation symptoms with dizziness and dyspnea or not recurrence of A. fib now that she is off Tikosyn.  Not a lot of other good options, but clearly with recent torsades we would not  restart Tikosyn. Check 2-week monitor followed by chemistries.  We will then have her follow-up with our CV[facility] clinical pharmacist to review her medications to ensure there are no further interactions. Pending result of monitor, may refer directly to electrophysiology for assistance in rate versus rhythm control options.  Not many antiarrhythmics are good choices.      Relevant Orders   Comprehensive metabolic panel   Magnesium   LONG TERM MONITOR (3-14 DAYS)   Mild aortic stenosis by prior echocardiogram (Chronic)    Probably functional bicuspid valve with borderline mild stenosis.  Not a major issue, can reassess in a couple years if we do any other reason to check for function.      Hypertension associated with diabetes ([place]) (Chronic)    Blood pressure is pretty high today, she says that this is before she took her meds and yesterday it was 128/70. >  She is also not feeling very well this morning with some nausea.  I suspect that is probably far artificially elevated.  For now, we will simply continue with current meds.  She is on Lopressor 100 mg twice daily   With her still having bouts of dizziness, but reluctant to push too much further at this point in time.  She is not having diastolic heart failure symptoms.        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 (Chronic)    Distant history of inferior infarct with fixed noted on Myoview and wall motion on echo.  Thankfully, her EF is back essentially to baseline.  Not having active CHF or anginal symptoms.      Relevant Orders   Comprehensive metabolic panel   Magnesium   Polymorphic ventricular tachycardia (Chronic)    Probably multifactorial from the combination of medications and her diarrhea from UTIs etc.  She still having nausea.  Unfortunately multiple medications were stopped including Tikosyn and her Zoloft.  She is also not on phentermine anymore. X I think is probably okay for her to  occasionally use Phenergan for nausea, and with her extensive psychiatric history we can simply stop 200 of Zoloft so we will have her restart 100 mg Zoloft.  Thankfully, she has had ischemic evaluation and echocardiogram and relatively normal and she is not having active symptoms now.  With her having some more fluttering sensations we are checking 2-week monitor and I will have her follow-up with our clinical pharmacist to review her medications to ensure there are no further conflicting medications.      Relevant Orders   Comprehensive metabolic panel   Magnesium   LONG TERM MONITOR (3-14 DAYS)   Chronic diastolic CHF (congestive heart failure), NYHA class 2 (HCC) (Chronic)    Would like to potentially consider afterload reduction if her pressures continue to be elevated.  Since I am not actively seeing her and she is relatively briefly out of hospital I would like to reassess her pressures and follow-up and  adjust afterload reduction at that point.  Otherwise continue current dose of Lopressor and Imdur.  She is also on furosemide and I think she can go back to taking furosemide maybe 3 times a week at a minimum.      Relevant Orders   Comprehensive metabolic panel   Magnesium     Other   Morbid obesity - s/p Lap Band 9/'05 (Chronic)    Unfortunate this has become a difficult issue with her because she has significant GI symptoms at different interval depending on what is being done with the Lap-Band.  She has a tendency to have nausea and vomiting and diarrhea depending on what is being done.  As such, with potential derangements in electrolytes, I think that probably Tikosyn may not be a great option given her recent hospitalization for torsades.      Fluttering heart    Fluttering heart symptoms concerning for somewhat he has known PAF, and brief bursts of PAT as well as polymorphic VT/torsades.  -Check 2-week Zio patch monitor.      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  Unfortunate that is incorrect based on the recommendation she should be on the Plavix for 6 months in addition to Eliquis.  Indeed we do not want her to be on it long-term, but telling someone to stop antiplatelet agent prematurely could be in-stent thrombosis.  She will continue Eliquis and Plavix through December.  We are restarting Plavix.  We will stop as of January 1.  She will then continue with Eliquis monotherapy.  Eliquis can be held 2 to 3 days preop for knee surgery or procedures      Relevant Orders   Comprehensive metabolic panel   Magnesium   LONG TERM MONITOR (3-14 DAYS)    ====================================  History of Present Illness:    Katherine [name] is a 65 y.o. female with PMH summarized below who presents via telephone conferencing for a telehealth visit today as a hospital follow-up.  Katherine [name] is a former patient of Dr. Terance Ice with a history of CAD-CABG followed by PCI as well as PAD/carotid disease and PAF.   CARDIAC HISTORY:  CAD: 1994 -->  Inferior-posterior STEMI-> 3V CAD: LAD-D1 bifurcation & RCA -> emergent PTCA of infarct related artery (RCA) -->  => CABGx3: LIMA-LAD, SVG-D1, SVG-RPDA. Multiple Caths: 2001, 2002, 2003 than 2004 all with lesions in the SVG-D1.  Initial stent followed by's are followed by stent thrombosis.. Cath-PCI for UA was in January 2015: Native LAD and RCA 100% occluded.  LIMA-LAD and SVG-RPDA patent.  LCx - OM1 60% & ost OM 2 50% with normal OM 3.   SVG-D1 had 80% mid and 50% distal ISR --> 1- PCI with a Promus Premier DES 2.75 mm x 20 mm. Myoview August 2017 for preop: EF 60%.  Small anteroapical infarct but no ischemia.  Mild apical hypokinesis.  EF 62%.  LOW RISK Cath 04/2018 - stable findings; RCA & LAD-TO, Stable small OM1 90%. SVG-D1 ~20% distal insertion  Stent ISR (otw patent), Patent SVG-dRCA (did not see LIMA). R CFA subtotal stenosis.  Cath June 2022 followed by PCI (see below) -> negative FFR of ostial LCx.  DES PCI of SVG-diagonal anastomosis with ISR PTCA of distal SVG stent.  New stent overlapped old.  (Right Radial -> unable to visualize LIMA) This was for recurrent Afib RVR --> had Angina-type pain with Afib EP consult - 9/23 - Tikosyn load.; -->  Tikosyn recently discontinued because of PMVT/torsades CHA2DS2Vasc Score (HTN, DM, F, CAD, CVA = 6) =>Warfarin. -> converted to Eliquis HFpEF:  Echo 07/2019: CHA2DS2VASc EF 55 to 60%.  No LVH.  Paradoxical septal wall motion-post CABG.  GRII DD.  Normal RV size and function.  Mild bilateral atrial dilation.  Moderate MAC.  Trace MR.  Mild aortic stenosis (gradients: Mean 14.3 mmHg -peak 24.9 mmHg). PAD: Occlusion of the right common femoral artery with left common femoral artery occlusion as well.  Has left upper extremity AV fistula. COPD/asthma on home O2 (followed by Dr. Lamonte Sakai History of ESRD status post DD KT on Imuran and prednisone Rheumatoid arthritis Anemia chronic disease Morbid Obesity Status Post Lap Band   Katherine [name] was last seen in the clinic by me on December 06, 2019 -> this was a delayed follow-up (she has a tendency to miss visits).  She was having occasional squeezing in her chest with some left arm discomfort and tingling.  Happens with and without exertion.  Often occur occurred when she is tired or pushing her self.  Was happy on Eliquis.  Had switched from Tegretol to Lamictal.  There was concern about interaction.  Had not had any recurrence of A. fib.  No  PND orthopnea.  Baseline dyspnea from deconditioning and arthritis pains.   Recent Hospitalizations:  07/11/2020: Admitted with acute respiratory failure.  She was having difficulty breathing because her roommate was smoking.  Started having productive cough brownish-green sputum-pneumonia/SIRS.  Also thought to have some diastolic heart failure (EF was 55 to 60% with grade 3 diastolic smoking) apparently she had been holding her Lasix for a week prior to presentation.  Was hypoxic on arrival and was diuresed ->.  Placed on cefepime and azithromycin that was converted to Augmentin on discharge.  Discharged on 3 L of O2.  01/28/2021: Admitted for progressive dyspnea and substernal chest pain worse over the last few days.  Thought to be acute on chronic heart failure -> IV Lasix, converted from diltiazem to beta-blocker. Underwent cardiac cath-found to have a 60% ostial LCx with significant SVG-diagonal graft (see below) plan was to treat medically and evaluate circumflex plus for ischemia and perform PCI on the SVG-D2 if symptoms recur (very difficult imaging via radial approach). ->  Statin increased, told to restart Lasix. Recommended vascular surgical referral in the outpatient setting.  7/6-03/2021: Readmitted for recurrent chest pain that awoke her from sleep. => Felt to be ACS/unstable angina and referred for PCI. ->  Negative FFR of ostial LCx.  ISR PTCA it of the SVG-D2 with DES PCI of the graft into the native vessel anastomosis. => Plan was 6 months of Plavix plus Eliquis along with beta-blocker, Imdur and metoprolol.  Also continued on Lasix 40 mg daily.  A1c noted to be 9.1. ->  Discharged after overnight evaluation. 8/5-01/2021: Overnight admission to evaluate chest pain with taking a deep breath and coughing.  Felt like pain was likely costochondritis musculoskeletal pain.  Given Voltaren gel. 03/30/2021 ER visit for acute cystitis -> was in  the process of completing amoxicillin course.  Was  converted to [place]. ER visit 04/12/2021: Suffered a fall at home, and then the emergency room.  She is set her feet slipped out underneath her.  Pain all over because of fibromyalgia.  Because potential head trauma was monitored closely because of her being on Eliquis. 9/3-01/2021: Presented with nausea vomiting abdominal pain as well as diarrhea.  Unable to keep down any p.o. intake.--Intractable diarrhea and nausea/vomiting.  No acute abnormality noted on CT.  Felt to be viral gastroenteritis.  Again should keep.  Placed on Keppra.  Mild troponin elevation secondary to demand ischemia.  With volume depleted.  Given IV fluids.  Plan was to resume home diuretics on discharge.  Was on Cymbalta Flexeril, Zoloft and Lamictal along with Tikosyn. 9/25-10/09/2020: Admitted with syncope and palpitations.  Witnessed syncopal event, unexpected.  No prodrome.  EMS EKG showed torsade de pointes-was treated with magnesium and dopamine for mild bradycardia admitted overnight in the ICU.  Medications that could prolong her QT interval were discontinued including Phenergan and Zoloft temporarily Cymbalta/Lamictal along with Tikosyn.  Recurrent UTI treated with 3 days IV Rocephin.   Reviewed  CV studies:     The following studies were reviewed today: (if available, images/films reviewed: From Epic Chart or Care Everywhere) Cardiac MRI May 2021: Normal LV size and thickness.  EF 62%.  WMA (hypokinesis of mid anteroseptal anterior and apical wall along with inferior wall..  Moderate LA dilation.  Mild PA dilation.  MR with trivial TR.  Late enhancement in the mid anteroseptal and anterior wall as well as apical septal anterior and inferior wall.  Findings suggestive of old myocardial infarction in the mid LAD territory.  Otherwise preserved EF.  No evidence of myocarditis.  TTE 07/12/2020: EF 40 to 45%.  Moderate concentric LVH.  GRII DD.  Elevated LAP.  Moderate HK of the mid apical anterior and anteroseptal wall with mild  dyskinesis of the apex.  Moderate LA dilation.  Mild MR.  Aortic sclerosis with no stenosis. TTE 01/30/2021: EF 55 to 60%.  Mild LVH.  GR 1 DD.  Elevated LAP.  Moderate LA dilation.  Mild MR with mild MS.  Mild aortic valve stenosis.: 02/20/2021: Cardiac Cath-PCI: Plan was Eliquis plus clopidogrel without aspirin-minimum 6 months dual therapy Stable Severe native CAD with chronic total occlusion of LAD and RCA LCx calcified 60% ostial lesion - RFR negative (0.96); PCI deferred. LIMA to LAD not visualized as only access was right radial SVG to RCA 40% - confirmed  SVG to D2 recurrent high-grade 90% ISR with 95% lesion in D2 after graft insertion Successful DES PCI of 95% lesion in D2 after graft insertion and recent stent with Onyx Frontier 2.25 mm x 12 mm postdilated to 2.5 mm distally and 2.75 mm at the overlap. Borderline successful PTCA of in-stent restenosis in the body of graft. ->  2.5 mm scoring balloon followed by post dilation with a 2.75 mm [place] balloon   Diagnostic Dominance: Right                                                      Intervention    Inerval History   Katherine [name] is being seen today via telephone conferencing because of multiple questions from her recent hospitalizations.  Unfortunately, she was not  scheduled to be seen until early November and had multiple questions. Several more related to medications: Her pharmacist told her that she should not take Eliquis and Plavix, so she decided to hold it for the last 1 week.  She is not sure what she should do. She was not discharged on Zoloft and is concerned about being tearful and depressed.  Asked if she can go back to taking half the dose which is 100 mg daily. She is happy to not be on Tikosyn based on what she heard in the hospital but is wondering what will be done about her A. fib now.  She is noticing flutters that last anywhere from seconds to minutes.  Not necessarily associated with significant dyspnea more than  baseline.   Thankfully, from a chest pain standpoint she has not had any further anginal symptoms since her PCI.  She does have some musculoskeletal side twinges but nothing like her anginal symptoms.  She has baseline dyspnea still losing 3 L of oxygen.  Although she said fluttering, have not been long lasting, but they do make her feel more short of breath.  She feels very weak and tired when they occur as well.  Nothing has been more than a couple minutes, usually lasting only few seconds. She says her swelling is pretty well controlled but she is not sure what to do with her furosemide.  Cardiovascular ROS: positive for - dyspnea on exertion, irregular heartbeat, palpitations, paroxysmal nocturnal dyspnea, shortness of breath, and fatigue and jitteriness.  Insomnia/anxiety negative for - loss of consciousness, orthopnea, paroxysmal nocturnal dyspnea, shortness of breath, or true syncope, TIA/amaurosis fugax   ROS:  Please see the history of present illness.     Review of Systems  Constitutional:  Positive for malaise/fatigue. Negative for weight loss.  HENT:  Positive for congestion. Negative for nosebleeds and sinus pain.   Respiratory:  Positive for shortness of breath (Baseline).   Cardiovascular:  Negative for leg swelling and PND.       Subclavian and peripheral artery disease.  Gastrointestinal:  Negative for blood in stool and melena.  Genitourinary:  Negative for dysuria and hematuria.       Not currently, recent UTI though.  Musculoskeletal:  Positive for back pain, falls, joint pain and myalgias.   Past Medical History:  Diagnosis Date   Anemia    Anxiety    Bilateral carotid artery stenosis    Carotid duplex 09/4823: 0-03% LICA, 70-48% RICA, >88% RECA, f/u 1 yr suggested   CAD (coronary artery disease) of bypass graft 5/01; 3/'02, 8/'03, 10/'04; 1/15   PCI x 5 to SVG-D1    CAD in native artery 07/1993   3 Vessel Disease (LAD-D1 & RCA) -- CABG (Dx in setting of inferior  STEMI-PTCA of RCA)   CAD S/P percutaneous coronary angioplasty    PCI to SVG-D1 insertion/native D1 x 4 = '01 -(S660 BMS 2.5 x 9 anastomosis- D1); '02 - distal overlap ACS Pixel 2.5 x 8  BMS; '03 distal/native ISR/Thrombosis - Pixel 2.5 x 13; '04 - ISR-  Taxus 2.5 x 20 (covered all);; 1/15 - mid SVG-D1 (50% distal ISR) - Promus P 2.75 x 20 -- 2.8 mm; 6/22: Extensive ISR PTCA & Anastomotic-Native Diag DES PCI (Frontier Onyx 2.25x12 (2.75-2.5 mm post-dilation   COPD mixed type ([place])    Followed by Dr. Lamonte Sakai "pulmonologist said no COPD"   Depression with anxiety    Diabetes mellitus type 2 in obese ([place])  Diarrhea    started after cholecystectomy and mass removed from intestine   Dyslipidemia, goal LDL below 70    08/2012: TC 137, TG 200, HDL 32!, LDL 45; on statin (followed by Dr.Deterding)   ESRD (end stage renal disease) ([place]) 1991   s/p Cadaveric Renal Transplant [facility] - Dr. Jimmy Footman)    Family history of adverse reaction to anesthesia    mom's bp dropped during/after anesthesia   Fibromyalgia    GERD (gastroesophageal reflux disease)    Glomerulonephritis, chronic, rapidly progressive 1989   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 07/1993   Rescue PTCA of RCA -- referred for CABG.   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  Had having having lots of nausea and vomiting with poor p.o. intake.  Thought to have QT prolongation with multiple medications involved and hypomagnesemia, hypokalemia.  Tikosyn discontinued along with Zoloft and Phenergan.   Hypertension associated with diabetes ([place])    Mild aortic stenosis by prior echocardiogram 07/2019   Echo:  Mild aortic stenosis (gradients:  Mean 14.3 mmHg -peak 24.9 mmHg).   Morbid obesity (HCC)    MRSA (methicillin resistant staph aureus) culture positive    OSA (obstructive sleep apnea)    no longer on CPAP or home O2, states she doesn't need now after lap band   PAD (peripheral artery disease) ([place]) 08/2013   LEA Dopplers to be read by Dr. Fletcher Anon   PAF (paroxysmal atrial fibrillation) ([place]) 06/2014   Noted on CardioNet Monitor  - --> rhythm control with Tikosyn (Dr. Rayann Heman); converted from warfarin to apixaban for anticoagulation.   Pneumonia    Recurrent boils    Bilateral Groin   Rheumatoid arthritis ([place])    Per Patient Report; associated with OA   S/p cadaver renal transplant 1991   [facility]   Unstable angina ([place]) 5/01; 3/'02, 8/'03, 10/'04; 1/15   x 5 occurences since Inf-Post STEMI in 1994   Past Surgical History:  Procedure Laterality Date   ABDOMINAL AORTOGRAM N/A 04/21/2018   Procedure: ABDOMINAL AORTOGRAM;  Surgeon: [name], MD;  Location: [facility];  Service: Cardiovascular;  Laterality: N/A;   CATHETER REMOVAL     CHOLECYSTECTOMY N/A 10/29/2014   Procedure: LAPAROSCOPIC CHOLECYSTECTOMY WITH INTRAOPERATIVE CHOLANGIOGRAM;  Surgeon: Excell Seltzer, MD;  Location: [facility];  Service: General;  Laterality: N/A;   CORONARY ANGIOPLASTY  1994   x5   CORONARY ARTERY BYPASS GRAFT  1995   LIMA-LAD, SVG-RPDA, SVG-D1   CORONARY STENT INTERVENTION N/A 02/20/2021   Procedure: PERCUTANEOUS CORONARY STENT INTERVENTION;  Surgeon: [name], MD;  Location: [facility]; ostLCx 60% (Neg RFR 0.96);; SVG- D2 recurrent 90% ISR & 95% native D2 after graft-> DES PCI of 95% anastomotic D2 lesion (Onyx Frontier 2.25 mm x 12 mm => 2.75 mm @ overlap, 2.5 distal.);PTCA of ISR in body of graft. ->  2.5 mm scoring balloon & post-dil w/ 2.75 mm [place] balloon   ESOPHAGOGASTRODUODENOSCOPY N/A 10/15/2016   Procedure: ESOPHAGOGASTRODUODENOSCOPY (EGD);  Surgeon: Wilford Corner, MD;  Location[facility];  Service:  Endoscopy;  Laterality: N/A;   I & D EXTREMITY  Right 01/29/2018   Procedure: IRRIGATION AND DEBRIDEMENT THUMB;  Surgeon: Dayna Barker, MD;  Location: [facility];  Service: Plastics;  Laterality: Right;   INCISE AND DRAIN ABCESS     INTRAVASCULAR PRESSURE WIRE/FFR STUDY N/A 02/20/2021   Procedure: INTRAVASCULAR PRESSURE WIRE/FFR STUDY;  Surgeon: [name], MD;  Location: [facility];  Service: Cardiovascular;  Laterality: N/A;   KIDNEY TRANSPLANT  1991   KNEE ARTHROSCOPY WITH LATERAL MENISECTOMY Left 12/03/2017   Procedure: LEFT KNEE ARTHROSCOPY WITH LATERAL MENISECTOMY;  Surgeon: Earlie Server, MD;  Location: [facility];  Service: Orthopedics;  Laterality: Left;   LAPAROSCOPIC GASTRIC BANDING  04/2004; 10/'09, 2/'10   Port Replacement x 2   LEFT HEART CATH AND CORONARY ANGIOGRAPHY N/A 02/20/2021   Procedure: LEFT HEART CATH AND CORONARY ANGIOGRAPHY;  Surgeon: [name], MD;  Location: [facility];  Service: Cardiovascular;  Laterality: N/A;   LEFT HEART CATH AND CORS/GRAFTS ANGIOGRAPHY N/A 04/21/2018   Procedure: LEFT HEART CATH AND CORS/GRAFTS ANGIOGRAPHY;  Surgeon: [name], MD;  Location: [facility];  Ost-Prox LAD 50% - proxLAD (pre & post D1) 100% CTO. Cx - patent, small OM1 (stable ~ ostial OM1 90%, too small for PCI) & 2 small LPL; Ost-distal RCA 100% CTO.  LIMA-LAD (not injected); SVG-dRCA patent, SVG-D1 - insertion stent ~20% ISR - Severe R CFA disease w/ focal Sub TO   LEFT HEART CATH AND CORS/GRAFTS ANGIOGRAPHY  5/'01, 3/'02, 8/'03, 10/'04; 1/'15   08/22/2013: LAD & RCA 100%; LIMA-LAD & SVG-rPDA patent; Cx-- OM1 60%, OM2 ostial ~50%; SVG-D1 - 80% mid, 50% distal ISR --PCI   LEFT HEART CATH AND CORS/GRAFTS ANGIOGRAPHY N/A 01/31/2021   Procedure: LEFT HEART CATH AND CORS/GRAFTS ANGIOGRAPHY;  Surgeon: Jolaine Artist, MD;  Location: [facility];;   LEFT HEART CATHETERIZATION WITH CORONARY/GRAFT ANGIOGRAM N/A 08/23/2013   Procedure: LEFT HEART  CATHETERIZATION WITH Beatrix Fetters;  Surgeon: Wellington Hampshire, MD;  Location: [name];  Service: Cardiovascular;  Laterality: N/A;   Lower Extremity Arterial Dopplers  08/2013   ABI: R 0.96, L 1.04   MULTIPLE TOOTH EXTRACTIONS  age 3   NM MYOVIEW LTD  03/2016   EF 62%. LOW RISK. C/W prior MI - no Ischemia. Apical hypokinesis.   PERCUTANEOUS CORONARY STENT INTERVENTION (PCI-S)  5/'01, 3/'02, 8/'03, 10/'04;   '01 - S660 BMS 2.5 x 9 - dSVG-D1 into D1; '02- post-stent stenosis - 2.5 x 8 Pixel BMS; '8\03: ISR/Thrombosis into native D1 - AngioJet, 2.5 x 13 Pixel; '04 - ISR 95% - covered stented area with Taxus DES 2.5 mm x 20 (2.88)   PERCUTANEOUS CORONARY STENT INTERVENTION (PCI-S)  08/23/2013   Procedure: PERCUTANEOUS CORONARY STENT INTERVENTION (PCI-S);  Surgeon: Wellington Hampshire, MD;  Location: [facility];  Service: Cardiovascular;;mid SVG-D1 80%; distal stent ~50% ISR; Promus Prermier DES 2.75 mm xc 20 mm (2.8 mm)   PORT-A-CATH REMOVAL     kidney   TRANSTHORACIC ECHOCARDIOGRAM  07/2019   a) 07/2019: EF 55 to 60%.  No LVH.  Paradoxical septal WM-s/p CABG.  GRII DD.  Nl RV size and fxn.  Mild bilateral atrial dilation.  Mod MAC.  Trace MR.  Mild AS (gradients: Mean 14.3 mmHg -peak 24.9 mmHg).; B) 06/2020: EF 40 to 45%.  Moderate concentric LVH.  GRII DD.  Elevated LAP.  Mod HK mid Apical Ant-AntSept wall & mild Apical Dyskinesis.  Mod LA dilation.  Mild MR.  AoV sclerosis w/o  AS.   TRANSTHORACIC ECHOCARDIOGRAM  01/30/2021   EF 55 to 60%.  Mild LVH.  GR 1 DD.  Elevated LAP.  Moderate LA dilation.  Mild MR with mild MS.  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   albuterol (VENTOLIN HFA) 108 (90 Base) MCG/ACT inhaler TAKE 2 PUFFS BY MOUTH EVERY 6 HOURS AS NEEDED FOR WHEEZE OR SHORTNESS OF BREATH    apixaban (ELIQUIS) 5 MG TABS tablet Take 1 tablet (5 mg total) by mouth 2 (two) times daily. APPOINTMENT NEEDED WITH CARDIOLOGIST FOR FURTHER REFILLS (Patient taking differently: Take 5 mg by mouth 2 (two) times daily.)   arformoterol (BROVANA) 15 MCG/2ML NEBU Take 2 mLs (15 mcg total) by nebulization 2 (two) times daily.   azaTHIOprine (IMURAN) 50 MG tablet Take 125 mg by mouth See admin instructions. Take 2 1/2 tablets (125 mg) by mouth daily at 3pm   budesonide (PULMICORT) 0.5 MG/2ML nebulizer solution Take 2 mLs (0.5 mg total) by nebulization 2 (two) times daily.   calcitRIOL (ROCALTROL) 0.25 MCG capsule Take 0.25 mcg by mouth every 3 (three) days.    cetirizine (ZYRTEC) 5 MG tablet Take 5 mg by mouth daily.   clotrimazole (MYCELEX) 10 MG troche Take 1 tablet (10 mg total) by mouth 5 (five) times daily as needed (thrush).   cyclobenzaprine (FLEXERIL) 10 MG tablet TAKE 2 TAB DAILY AT BEDTIME, MAY ALSO TAKE 1 TABLET BY MOUTH AT NOON AS NEEDED FOR MUSCLE SPASMS   fluticasone (FLONASE) 50 MCG/ACT nasal spray PLACE 2 SPRAYS INTO BOTH NOSTRILS 2 TIMES DAILY   insulin aspart (NOVOLOG FLEXPEN) 100 UNIT/ML FlexPen Inject 14 Units into the skin 3 (three) times daily with meals.   Insulin Glargine (BASAGLAR KWIKPEN) 100 UNIT/ML SOPN Inject 12 Units into the skin daily before breakfast.   isosorbide mononitrate (IMDUR) 60 MG 24 hr tablet Take 1 tablet (60 mg total) by mouth daily.   lamoTRIgine (LAMICTAL) 25 MG tablet TAKE 1 TABLET BY MOUTH EVERYDAY AT BEDTIME   LORazepam (ATIVAN) 1 MG tablet Take 1 tablet (1 mg total) by mouth 2 (two) times daily.   metFORMIN (GLUCOPHAGE) 500 MG tablet Take 1 tablet (500 mg total) by mouth 2 (two) times daily.   metoprolol tartrate (LOPRESSOR) 100 MG tablet Take 1 tablet (100 mg total) by mouth 2 (two) times daily. Appointment Required For Further Refills [phone]   montelukast (SINGULAIR) 10 MG tablet TAKE 1 TABLET BY MOUTH EVERYDAY AT BEDTIME   nitroGLYCERIN  (NITROSTAT) 0.4 MG SL tablet Place 1 tablet (0.4 mg total) under the tongue every 5 (five) minutes as needed for chest pain.   Oxycodone HCl 10 MG TABS Take 1 tablet (10 mg total) by mouth 4 (four) times daily as needed.   OXYGEN Inhale 3 L into the lungs continuous.   predniSONE (DELTASONE) 5 MG tablet Take 5 mg by mouth every morning.   promethazine (PHENERGAN) 25 MG tablet Take 25 mg by mouth every 6 (six) hours as needed for nausea or vomiting.   rosuvastatin (CRESTOR) 20 MG tablet TAKE 1 TABLET BY MOUTH EVERY DAY     Allergies:   Tetracycline, Niacin, Niaspan [niacin er], Sulfa antibiotics, Sulfonamide derivatives, Codeine, Erythromycin, Hydromorphone hcl, Morphine and related, Nalbuphine, Sulfasalazine, and Tape   Social History  Tobacco Use   Smoking status: Every Day    Packs/day: 1.00    Years: 30.00    Pack years: 30.00    Types: Cigarettes    Last attempt to quit: 08/17/2002    Years since quitting: 18.8   Smokeless tobacco: Never  Vaping Use   Vaping Use: Never used  Substance Use Topics   Alcohol use: No   Drug use: No     Family Hx: The patient's family history includes Arrhythmia in her brother and paternal aunt; Cancer in her father and mother; Heart disease in her father.   Labs/Other Tests and Data Reviewed:    EKG:  No ECG reviewed.  Recent Labs: 01/29/2021: B Natriuretic Peptide 352.2; TSH 1.176 05/13/2021: ALT 10 05/16/2021: Magnesium 2.0 05/18/2021: BUN 10; Creatinine, Ser 0.90; Hemoglobin 8.7; Platelets 322; Potassium 4.0; Sodium 133   Recent Lipid Panel Lab Results  Component Value Date/Time   CHOL 99 02/20/2021 01:25 AM   TRIG 165 (H) 02/20/2021 01:25 AM   HDL 51 02/20/2021 01:25 AM   CHOLHDL 1.9 02/20/2021 01:25 AM   LDLCALC 15 02/20/2021 01:25 AM    Wt Readings from Last 3 Encounters:  05/18/21 211 lb 10.3 oz (96 kg)  04/22/21 213 lb 13.5 oz (97 kg)  04/12/21 200 lb (90.7 kg)     Objective:    Vital Signs:  BP (!) 161/99   Pulse 88    Ht _0  (1.549 m)   SpO2 98%   BMI 39.99 kg/m   VITAL SIGNS:  reviewed GEN:  no acute distress RESPIRATORY:  - nonlabored NEURO:  -Alert and oriented x3   ==========================================  COVID-19 Education: The signs and symptoms of COVID-19 were discussed with the patient and how to seek care for testing (follow up with PCP or arrange E-visit).   The importance of social distancing was discussed today.  Time:   I spent a total of 65 minutes with the patient spent in direct patient consultation.  Additional time spent with chart review  / charting (studies, outside notes, etc): 70mn Total Time: 135 min   Current medicines are reviewed at length with the patient today.  (+/- concerns) multiple?s --> Do I restart Plavix?-Was told by her pharmacist that she should not take the Plavix with Eliquis.  As result, she stopped taking it a week ago.; do I restart Tikosyn?: Can I restart Zoloft?   Medication Adjustments/Labs and Tests Ordered: Current medicines are reviewed at length with the patient today.  Concerns regarding medicines are outlined above.   Patient Instructions  Medication Instructions:   Restart taking  Clopidogrel 75 mg   the first 2 days take 2 tablet  daily at the same time , then on the third day start one tablet daily  Continue medication until Aug 16, 2021. Stop taking Aug 17, 2021.   Start taking 100 mg Zoloft ( sertraline )  at bedtime   *If you need a refill on your cardiac medications before your next appointment, please call your pharmacy*   Lab Work: CMP Magnesium  If you have labs (blood work) drawn today and your tests are completely normal, you will receive your results only by: [place](if you have MyChart) OR A paper copy in the mail If you have any lab test that is abnormal or we need to change your treatment, we will call you to review the results.   Testing/Procedures:  Will be mailed to you - take 5 to 10 days Your  physician has recommended that you wear a holter monitor 14 days Zio . Holter monitors are medical devices that record the heart's electrical activity. Doctors most often use these monitors to diagnose arrhythmias. Arrhythmias are problems with the speed or rhythm of the heartbeat. The monitor is a small, portable device. You can wear one while you do your normal daily activities. This is usually used to diagnose what is causing palpitations/syncope (passing out).   Follow-Up: At [facility], you and your health needs are our priority.  As part of our continuing mission to provide you with exceptional heart care, we have created designated Provider Care Teams.  These Care Teams include your primary Cardiologist (physician) and Advanced Practice Providers (APPs -  Physician Assistants and Nurse Practitioners) who all work together to provide you with the care you need, when you need it.     Your next appointment:   4 month(s) Feb 17 ,2023   The format for your next appointment:   In Person  Provider:   [name], MD   Other Instructions   Your physician recommends that you schedule a follow-up appointment in:  [facility] -  to  go over medications  Nov 29 at 2:30pm -- bring all your medication you are taking .    Signed, [name], MD  06/06/2021 9:38 PM    [place]

## 2021-06-06 NOTE — Assessment & Plan Note (Signed)
She has multiple stents and now had a new stent with significant angioplasty of in-stent restenosis in the vein graft to the diagonal.  The plan was to do 6 months of Plavix plus Eliquis.  I think her pharmacist overstep bounds a little bit since she is relatively close to her PCI.  Recommend that she goes back on Plavix and complete total 6 months which would be through the end of December 2022.  States she has not been taking it for a week, she will start with 150 mg daily for 2 days and then go back to 75 mg daily.  After January 1, she will simply continue with Eliquis alone

## 2021-06-06 NOTE — Assessment & Plan Note (Signed)
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  Thankfully, her EF is back essentially to baseline.  Not having active CHF or anginal symptoms.

## 2021-06-12 DIAGNOSIS — I480 Paroxysmal atrial fibrillation: Secondary | ICD-10-CM

## 2021-06-12 DIAGNOSIS — I4729 Other ventricular tachycardia: Secondary | ICD-10-CM | POA: Diagnosis not present

## 2021-06-12 DIAGNOSIS — I498 Other specified cardiac arrhythmias: Secondary | ICD-10-CM | POA: Diagnosis not present

## 2021-06-12 DIAGNOSIS — Z7902 Long term (current) use of antithrombotics/antiplatelets: Secondary | ICD-10-CM

## 2021-06-15 ENCOUNTER — Other Ambulatory Visit: Payer: Self-pay | Admitting: Cardiology

## 2021-06-16 ENCOUNTER — Ambulatory Visit: Payer: Medicare Other | Admitting: Internal Medicine

## 2021-06-19 ENCOUNTER — Other Ambulatory Visit: Payer: Self-pay

## 2021-06-19 ENCOUNTER — Ambulatory Visit (INDEPENDENT_AMBULATORY_CARE_PROVIDER_SITE_OTHER): Payer: Medicare Other

## 2021-06-19 DIAGNOSIS — E1142 Type 2 diabetes mellitus with diabetic polyneuropathy: Secondary | ICD-10-CM

## 2021-06-19 DIAGNOSIS — I509 Heart failure, unspecified: Secondary | ICD-10-CM

## 2021-06-19 DIAGNOSIS — E1159 Type 2 diabetes mellitus with other circulatory complications: Secondary | ICD-10-CM

## 2021-06-19 DIAGNOSIS — I152 Hypertension secondary to endocrine disorders: Secondary | ICD-10-CM

## 2021-06-19 NOTE — Progress Notes (Signed)
Chronic Care Management Pharmacy Note  06/19/2021 Name:  Katherine [name] MRN:  [id number] DOB:  Jul 31, 1956  Summary: -Patient reports that she is doing well since her most recent hospitalization - notes that she has not been feeling as fatigued since being discharged  -Patient reports that she is following closely with cardiology - currently wearing zio XT monitor for 2 weeks, will follow up with cardiology to discuss results  -Per patient had been advised by [facility] to hold plavix, has discussed with cardiology - plan is to continue plavix and eliquis until 08/2021 then will continue on eliquis monotherapy -Patient notes that she has had a decreased appetite since discharge from hospital, and because of this she has not been using her insulin - reports that blood sugars have been averaging in the 230's  Recommendations/Changes made from today's visit: -Advised for patient to restart basglar at 4 units daily - will titrate based on BG readings - patient will continue to hold novolog at this time as she is not eating as much throughout the day -Recommended for patient to start testing blood sugar before each meal and at bedtime - will work to have CGM covered by insurance but requires a PCP visit as she has not had one within the last 6 months - patient to make follow up with PCP -Recommended for patient to start monitoring blood pressure / HR as well as monitoring weight daily and reaching out if she has a weight gain of >3lbs in a day or >5lbs in a week  - patient to continue using furosemide as needed  Subjective: Katherine [name] is an 65 y.o. year old female who is a primary patient of [name], MD.  The CCM team was consulted for assistance with disease management and care coordination needs.    Engaged with patient by telephone for follow up visit in response to provider referral for pharmacy case management and/or care coordination services.   Consent to Services:  The patient  was given the following information about Chronic Care Management services today, agreed to services, and gave verbal consent: 1. CCM service includes personalized support from designated clinical staff supervised by the primary care provider, including individualized plan of care and coordination with other care providers 2. 24/7 contact phone numbers for assistance for urgent and routine care needs. 3. Service will only be billed when office clinical staff spend 20 minutes or more in a month to coordinate care. 4. Only one practitioner may furnish and bill the service in a calendar month. 5.The patient may stop CCM services at any time (effective at the end of the month) by phone call to the office staff. 6. The patient will be responsible for cost sharing (co-pay) of up to 20% of the service fee (after annual deductible is met). Patient agreed to services and consent obtained.  Patient Care Team: [name], MD as PCP - General (Internal Medicine) [name], MD as PCP - Cardiology (Cardiology) Terance Ice, MD (Inactive) (Cardiology) Deterding, Jeneen Rinks, MD as Consulting Physician (Nephrology) Earlie Server, MD as Consulting Physician (Orthopedic Surgery) Knox Royalty, RN as Case Manager [name], MD (Internal Medicine)  Recent office visits:  03/25/21 Video Colin Benton DO- pt was seen for cough no labs were ordered and there were no med changes. No follow up noted. 08/13/20 - Scarlette Calico MD (PCP) -  f/u - discontinued ferrous sulfate, continue on lorazepam, start fluconazole x 10 days    Recent  consult visits:  06/04/21 - Dr. Naaman Plummer - Physical Medicine and Rehab - f/u for fibromyalgia - no changes to medications 06/06/21 - Dr. Ellyn Hack - Cardiology - restarting plavix through December - then plan is to continue with Eliquis therapy    Hospital visits:  ED to hospital admission 05/11/21-05/18/2021 Patient complained to syncope and palpitations - EMS rhythm strip  suspected polymorphic VT/ torsades de pointes   Stopped zoloft, tikosyn,dltiazem, furosemide, voltaren gel,  and phenergan  Objective:  Lab Results  Component Value Date   CREATININE 0.90 05/18/2021   BUN 10 05/18/2021   GFR 75.91 08/23/2019   GFRNONAA >60 05/18/2021   GFRAA 58 (L) 04/12/2020   NA 133 (L) 05/18/2021   K 4.0 05/18/2021   CALCIUM 8.7 (L) 05/18/2021   CO2 26 05/18/2021   GLUCOSE 186 (H) 05/18/2021    Lab Results  Component Value Date/Time   HGBA1C 6.9 (H) 05/12/2021 07:00 AM   HGBA1C 9.1 (H) 01/31/2021 01:43 AM   GFR 75.91 08/23/2019 02:18 PM   GFR 64.41 10/21/2018 02:49 PM   MICROALBUR <0.7 08/13/2020 03:50 PM   MICROALBUR 30 11/30/2017 04:56 PM    Last diabetic Eye exam:  Lab Results  Component Value Date/Time   HMDIABEYEEXA No Retinopathy 10/31/2020 12:00 AM    Last diabetic Foot exam:  No results found for: HMDIABFOOTEX   Lab Results  Component Value Date   CHOL 99 02/20/2021   HDL 51 02/20/2021   LDLCALC 15 02/20/2021   TRIG 165 (H) 02/20/2021   CHOLHDL 1.9 02/20/2021    Hepatic Function Latest Ref Rng & Units 05/13/2021 05/12/2021 04/19/2021  Total Protein 6.5 - 8.1 g/dL 5.9(L) 7.0 6.7  Albumin 3.5 - 5.0 g/dL 2.1(L) 2.5(L) 2.5(L)  AST 15 - 41 U/L 14(L) 24 33  ALT 0 - 44 U/L 10 8 10   Alk Phosphatase 38 - 126 U/L 94 121 116  Total Bilirubin 0.3 - 1.2 mg/dL 0.6 1.0 0.7  Bilirubin, Direct 0.0 - 0.2 mg/dL - - 0.3(H)    Lab Results  Component Value Date/Time   TSH 1.176 01/29/2021 06:25 AM   TSH 0.87 08/13/2020 03:50 PM   TSH 3.118 04/20/2018 07:40 AM   TSH 0.83 11/30/2017 04:05 PM    CBC Latest Ref Rng & Units 05/18/2021 05/17/2021 05/16/2021  WBC 4.0 - 10.5 K/uL 4.9 4.7 4.5  Hemoglobin 12.0 - 15.0 g/dL 8.7(L) 9.0(L) 9.4(L)  Hematocrit 36.0 - 46.0 % 27.8(L) 28.5(L) 29.9(L)  Platelets 150 - 400 K/uL 322 286 277    No results found for: VD25OH  Clinical ASCVD: Yes  The ASCVD Risk score (Arnett DK, et al., 2019) failed to calculate for  the following reasons:   The patient has a prior MI or stroke diagnosis    Depression screen [facility] 2/9 03/13/2021 03/12/2021 03/12/2021  Decreased Interest 2 2 2   Down, Depressed, Hopeless 2 2 2   PHQ - 2 Score 4 4 4   Altered sleeping 2 - -  Tired, decreased energy 2 - -  Change in appetite 1 - -  Feeling bad or failure about yourself  0 - -  Trouble concentrating 1 - -  Moving slowly or fidgety/restless 1 - -  Suicidal thoughts 0 - -  PHQ-9 Score 11 - -  Difficult doing work/chores Somewhat difficult - -  Some recent data might be hidden   Social History   Tobacco Use  Smoking Status Every Day   Packs/day: 1.00   Years: 30.00   Pack years: 30.00  Types: Cigarettes   Last attempt to quit: 08/17/2002   Years since quitting: 18.8  Smokeless Tobacco Never   BP Readings from Last 3 Encounters:  06/06/21 (!) 161/99  05/18/21 123/68  04/22/21 132/81   Pulse Readings from Last 3 Encounters:  06/06/21 88  05/18/21 67  04/22/21 61   Wt Readings from Last 3 Encounters:  05/18/21 211 lb 10.3 oz (96 kg)  04/22/21 213 lb 13.5 oz (97 kg)  04/12/21 200 lb (90.7 kg)   BMI Readings from Last 3 Encounters:  06/06/21 39.99 kg/m  05/18/21 39.99 kg/m  04/22/21 40.41 kg/m    Assessment/Interventions: Review of patient past medical history, allergies, medications, health status, including review of consultants reports, laboratory and other test data, was performed as part of comprehensive evaluation and provision of chronic care management services.   SDOH:  (Social Determinants of Health) assessments and interventions performed: Yes  SDOH Screenings   Alcohol Screen: Not on file  Depression (PHQ2-9): Medium Risk   PHQ-2 Score: 11  Financial Resource Strain: Not on file  Food Insecurity: No Food Insecurity   Worried About Charity fundraiser in the Last Year: Never true   Ran Out of Food in the Last Year: Never true  Housing: Low Risk    Last Housing Risk Score: 0  Physical  Activity: Not on file  Social Connections: Not on file  Stress: Not on file  Tobacco Use: High Risk   Smoking Tobacco Use: Every Day   Smokeless Tobacco Use: Never   Passive Exposure: Not on file  Transportation Needs: No Transportation Needs   Lack of Transportation (Medical): No   Lack of Transportation (Non-Medical): No    CCM Care Plan  Allergies  Allergen Reactions   Tetracycline Hives    Patient tolerated Doxycycline Dec 2020   Niacin Other (See Comments)    Mouth blisters   Niaspan [Niacin Er] Other (See Comments)    Mouth blisters   Sulfa Antibiotics Nausea Only and Other (See Comments)    "Tears up stomach"   Sulfonamide Derivatives Other (See Comments)    Reaction: per patient "tears her stomach up"   Codeine Nausea And Vomiting   Erythromycin Nausea And Vomiting   Hydromorphone Hcl Nausea And Vomiting   Morphine And Related Nausea And Vomiting   Nalbuphine Nausea And Vomiting    Nubain   Sulfasalazine Nausea Only and Other (See Comments)    per patient "tears her stomach up", "Tears up stomach"   Tape Rash and Other (See Comments)    No "plastic" tape," please----cloth tape only    Medications Reviewed Today     Reviewed by [name], MD (Physician) on 06/06/21 at [place] List Status: <None>   Medication Order Taking? Sig Documenting Provider Last Dose Status Informant  acetaminophen (TYLENOL) 500 MG tablet [id number] Yes Take 1,500 mg by mouth 2 (two) times daily. [provider] Taking Active Self  albuterol (VENTOLIN HFA) 108 (90 Base) MCG/ACT inhaler [id number] Yes TAKE 2 PUFFS BY MOUTH EVERY 6 HOURS AS NEEDED FOR WHEEZE OR SHORTNESS OF BREATH Collene Gobble, MD Taking Active            Med Note Maud Deed   Mon May 12, 2021  2:39 PM)    apixaban (ELIQUIS) 5 MG TABS tablet [id number] Yes Take 1 tablet (5 mg total) by mouth 2 (two) times daily. APPOINTMENT NEEDED WITH CARDIOLOGIST FOR FURTHER REFILLS  Patient taking differently: Take  5 mg  by mouth 2 (two) times daily.   Sherran Needs, NP Taking Active Self  arformoterol [facility]) 15 MCG/2ML NEBU [id number] Yes Take 2 mLs (15 mcg total) by nebulization 2 (two) times daily. Collene Gobble, MD Taking Active Self  azaTHIOprine (IMURAN) 50 MG tablet [id number] Yes Take 125 mg by mouth See admin instructions. Take 2 1/2 tablets (125 mg) by mouth daily at 3pm [provider] Taking Active Self           Med Note Broadus John, REGEENA   Mon May 12, 2021  2:43 PM)    budesonide (PULMICORT) 0.5 MG/2ML nebulizer solution [id number] Yes Take 2 mLs (0.5 mg total) by nebulization 2 (two) times daily. [name], MD Taking Active Self  calcitRIOL (ROCALTROL) 0.25 MCG capsule [id number] Yes Take 0.25 mcg by mouth every 3 (three) days.  [provider] Taking Active Self           Med Note Maud Deed   Mon May 12, 2021  2:44 PM)    cetirizine (ZYRTEC) 5 MG tablet [id number] Yes Take 5 mg by mouth daily. [provider] Taking Active Self  clopidogrel (PLAVIX) 75 MG tablet [id number] No Take 1 tablet (75 mg total) by mouth daily.  Patient not taking: Reported on 06/06/2021   Cheryln Manly, NP Not Taking Active Self           Med Note Fransico Meadow May 12, 2021  2:47 PM)    clotrimazole [facility]) 10 MG troche [id number] Yes Take 1 tablet (10 mg total) by mouth 5 (five) times daily as needed (thrush). Edwin Dada, MD Taking Active Self           Med Note Fransico Meadow May 12, 2021  2:48 PM)    cyclobenzaprine (FLEXERIL) 10 MG tablet [id number] Yes TAKE 2 TAB DAILY AT BEDTIME, MAY ALSO TAKE 1 TABLET BY MOUTH AT NOON AS NEEDED FOR MUSCLE SPASMS Meredith Staggers, MD Taking Active   fluticasone [facility]) 50 MCG/ACT nasal spray [id number] Yes PLACE 2 SPRAYS INTO BOTH NOSTRILS 2 TIMES DAILY Collene Gobble, MD Taking Active            Med Note Maud Deed   Mon May 12, 2021  2:54 PM)    glimepiride (AMARYL) 4 MG tablet [id number] No  Take 4 mg by mouth daily. [provider] Unknown Active Self           Med Note Broadus John, REGEENA   Mon May 12, 2021  2:59 PM)    insulin aspart (NOVOLOG FLEXPEN) 100 UNIT/ML FlexPen [id number] Yes Inject 14 Units into the skin 3 (three) times daily with meals. [provider] Taking Active Self           Med Note Broadus John, REGEENA   Mon May 12, 2021  3:00 PM)    Insulin Glargine [facility]) 100 UNIT/ML SOPN [id number] Yes Inject 12 Units into the skin daily before breakfast. [provider] Taking Active Self           Med Note Broadus John, REGEENA   Mon May 12, 2021  3:06 PM)    isosorbide mononitrate (IMDUR) 60 MG 24 hr tablet [id number] Yes Take 1 tablet (60 mg total) by mouth daily. Debbe Odea, MD Taking Active Self  lamoTRIgine (LAMICTAL) 25 MG tablet [id number] Yes TAKE 1 TABLET BY MOUTH EVERYDAY AT BEDTIME Meredith Staggers, MD Taking Active  Med Note Broadus John, REGEENA   Mon May 12, 2021  3:09 PM)    LORazepam (ATIVAN) 1 MG tablet [id number] Yes Take 1 tablet (1 mg total) by mouth 2 (two) times daily. [name], MD Taking Active Self           Med Note Broadus John, [facility]   Mon May 12, 2021  3:09 PM)    magnesium oxide (MAG-OX) 400 (240 Mg) MG tablet [id number] No Take 1 tablet (400 mg total) by mouth daily.  Patient not taking: No sig reported   Pokhrel, Laxman, MD Not Taking Active Self  metFORMIN (GLUCOPHAGE) 500 MG tablet [id number] Yes Take 1 tablet (500 mg total) by mouth 2 (two) times daily. Burnell Blanks, MD Taking Active Self           Med Note Broadus John, [facility]   Mon May 12, 2021  3:08 PM)    metoprolol tartrate (LOPRESSOR) 100 MG tablet [id number] Yes Take 1 tablet (100 mg total) by mouth 2 (two) times daily. Appointment Required For Further Refills [phone] Debbe Odea, MD Taking Active   montelukast (SINGULAIR) 10 MG tablet [id number] Yes TAKE 1 TABLET BY MOUTH EVERYDAY AT BEDTIME Collene Gobble, MD Taking Active Multiple  Informants           Med Note Broadus John, REGEENA   Mon May 12, 2021  3:11 PM)    nitroGLYCERIN (NITROSTAT) 0.4 MG SL tablet [id number] Yes Place 1 tablet (0.4 mg total) under the tongue every 5 (five) minutes as needed for chest pain. Burnell Blanks, MD Taking Active Self           Med Note Broadus John, [facility]   Mon May 12, 2021  3:12 PM)    Oxycodone HCl 10 MG TABS [id number] Yes Take 1 tablet (10 mg total) by mouth 4 (four) times daily as needed. Meredith Staggers, MD Taking Active   OXYGEN [id number] Yes Inhale 3 L into the lungs continuous. [provider] Taking Active Self  pantoprazole (PROTONIX) 40 MG tablet [id number] No TAKE 1 TABLET BY MOUTH EVERY DAY  Patient not taking: Reported on 06/06/2021   [name], MD Not Taking Active   predniSONE (DELTASONE) 5 MG tablet [id number] Yes Take 5 mg by mouth every morning. [provider] Taking Active Self           Med Note Broadus John, REGEENA   Mon May 12, 2021  3:21 PM)    promethazine (PHENERGAN) 25 MG tablet [id number] Yes Take 25 mg by mouth every 6 (six) hours as needed for nausea or vomiting. [provider] Taking Active   rosuvastatin (CRESTOR) 20 MG tablet [id number] Yes TAKE 1 TABLET BY MOUTH EVERY DAY [name], MD Taking Active   rosuvastatin (CRESTOR) 40 MG tablet [id number] No Take 1 tablet (40 mg total) by mouth daily.  Patient not taking: Reported on 06/06/2021   Edwin Dada, MD Not Taking Active Family Member           Med Note Maud Deed   Mon May 12, 2021  3:21 PM)    sertraline (ZOLOFT) 100 MG tablet [id number] Yes Take 1 tablet (100 mg total) by mouth daily. [name], MD  Active             Patient Active Problem List   Diagnosis Date Noted   Torsades de pointes 05/12/2021   Polymorphic ventricular tachycardia    Bradycardia    Acute encephalopathy    COPD (  chronic obstructive pulmonary disease) (HCC)    AKI (acute kidney injury) ([place])    History of  renal transplant    Type 2 diabetes mellitus with hyperglycemia (HCC)    Nausea & vomiting 04/19/2021   Acute kidney injury ([place]) 04/19/2021   Viral syndrome 03/21/2021   Pure hypercholesterolemia    Coronary stent restenosis due to scar tissue    Unstable angina (HCC) 02/19/2021   Chronic diastolic CHF (congestive heart failure), NYHA class 2 ([place]) 01/29/2021   Acute CHF (congestive heart failure) ([place]) 01/29/2021   Non-ST elevation (NSTEMI) myocardial infarction [facility])    Chronic respiratory failure with hypoxia ([place]) 01/01/2021   Flu vaccine need 08/13/2020   Need for pneumococcal vaccination 08/13/2020   Visit for screening mammogram 08/13/2020   Candidal skin infection 08/13/2020   GAD (generalized anxiety disorder) 08/13/2020   Chronic bronchitis, mucopurulent ([place]) 08/24/2019   Current chronic use of systemic steroids 07/23/2019   OSA (obstructive sleep apnea)    Severe episode of recurrent major depressive disorder, with psychotic features ([place]) 02/16/2019   Obstructive chronic bronchitis without exacerbation ([place]) 11/03/2018   Long term (current) use of antithrombotics/antiplatelets 04/28/2018   Renal transplant, status post 01/28/2018   LBBB (left bundle branch block) 10/07/2017   Primary osteoarthritis of right knee 08/18/2017   Shortness of breath 06/18/2017   Mild aortic stenosis by prior echocardiogram 01/28/2017   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   Deficiency anemia 10/14/2016   Normocytic anemia 10/13/2016   Fibromyalgia 03/30/2016   Other spondylosis with radiculopathy, lumbar region 03/30/2016   Type 2 diabetes mellitus with peripheral neuropathy ([place]) 03/30/2016   Paroxysmal atrial fibrillation ([place]); CHA2DS2VASc score F, HTN, CAD, CVA = 5 06/28/2014   Fluttering heart 06/21/2014   CAD S/P percutaneous coronary angioplasty - PCI x 5 to SVG-D1 09/11/2013    Class: Diagnosis of   Hypertension associated with diabetes ([place])    Chest pain 08/23/2013   PAD (peripheral  artery disease) ([place]) 08/17/2013   CAD (coronary artery disease) of bypass graft 10/08/2012   Stenosis of right carotid artery without infarction 10/08/2012   Dyslipidemia, goal LDL below 70 10/08/2012    Class: Diagnosis of   Mitral annular calcification 10/08/2012   Hypomagnesemia 06/13/2012   Renal transplant disorder 06/12/2012   Chronic allergic rhinitis 04/29/2011   Extrinsic asthma 09/09/2007   GERD 09/09/2007   COUGH, CHRONIC 09/09/2007   Morbid obesity - s/p Lap Band 9/'05 05/07/2004    Class: Diagnosis of   Hx of CABG 09/07/1993    Class: History of   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 07/1993   Coronary artery disease involving native coronary artery of native heart with angina pectoris ([place]) 07/1993   ESRD (end stage renal disease) ([place]) 1991    Immunization History  Administered Date(s) Administered   Influenza Split 05/11/2012, 05/17/2016, 05/17/2017   Influenza Whole 06/17/2009, 06/17/2010, 04/18/2011   Influenza,inj,Quad PF,6+ Mos 08/24/2013, 05/20/2015, 04/21/2018, 04/01/2019, 08/13/2020, 05/13/2021   PFIZER Comirnaty([place])Covid-19 Tri-Sucrose Vaccine 12/30/2019   Pneumococcal Conjugate-13 08/13/2020   Pneumococcal Polysaccharide-23 11/30/2017   Tdap 11/30/2017   Zoster Recombinat (Shingrix) 07/04/2018, 03/08/2019    Conditions to be addressed/monitored:  Hypertension, Hyperlipidemia, Diabetes, Atrial Fibrillation, Heart Failure, Coronary Artery Disease, GERD, COPD, Chronic Kidney Disease, Depression, Anxiety, Osteoarthritis, Fibromyalgia and Allergic Rhinitis  Care Plan : CCM Care Plan  Updates made by Tomasa Blase, RPH since 06/19/2021 12:00 AM     Problem: Hypertension, Hyperlipidemia, Diabetes, Atrial Fibrillation, Heart Failure, Coronary Artery  Disease, GERD, COPD, Chronic Kidney Disease, Depression, Anxiety, Osteoarthritis, Fibromyalgia and Allergic Rhinitis   Priority: High  Onset Date: 05/07/2021      Long-Range Goal: Disease Management   Start Date: 05/07/2021  Expected End Date: 11/04/2021  This Visit's Progress: Not on track  Recent Progress: On track  Priority: High  Note:   Current Barriers:  Unable to independently monitor therapeutic efficacy Does not adhere to prescribed medication regimen Does not maintain contact with provider office Does not contact provider office for questions/concerns  Pharmacist Clinical Goal(s):  Patient will achieve adherence to monitoring guidelines and medication adherence to achieve therapeutic efficacy adhere to prescribed medication regimen as evidenced by no missed doses of prescribed medications  contact provider office for questions/concerns as evidenced notation of same in electronic health record through collaboration with PharmD and provider.   Interventions: 1:1 collaboration with [name], MD regarding development and update of comprehensive plan of care as evidenced by provider attestation and co-signature Inter-disciplinary care team collaboration (see longitudinal plan of care) Comprehensive medication review performed; medication list updated in electronic medical record  Hyperlipidemia/ CAD/ History of NSTEMI: (LDL goal < 70) -Controlled Lab Results  Component Value Date   [place] 15 02/20/2021  -Current treatment: Rosuvastatin 20mg  daily  Clopidogrel 75mg  - 1 tablet daily  Nitroglycerin 0.4mg  - 1 tablet every 5 minutes x 3 doses as needed for chest pain -Medications previously tried: pravastatin   -Current dietary patterns: unable to discuss during this appointment  -Current exercise habits: limited due to her chronic pain  -Educated on Cholesterol goals;  Benefits of statin for ASCVD risk reduction; Importance of limiting foods high in cholesterol; -Counseled on diet and exercise extensively Recommended to continue current medication -Per cardiology note - patient will continue plavix + eliquis until 08/2021 then  will discontinue plavix and continue elqiuis monotherapy   Diabetes (A1c goal <7%) -Not ideally controlled Lab Results  Component Value Date   HGBA1C 6.9 (H) 05/12/2021  -Current medications: Novolog - 14 units 3 times daily with meals - not currently using   Basaglar - 12 units once daily - not currently using  Glimepiride 4mg  - 1 tablet daily  Metformin 500mg  - 1 tablet twice daily  -Medications previously tried: lantus,   -Current home glucose readings fasting glucose: patient reports that blood sugars have been averaging ~230 (patient is not currently using her insulin) -denies hypoglycemic/hyperglycemic symptoms  -Current meal patterns:  Patient reports that her appetite has been decreased, is not eating much at her meals -Current exercise: limited due to chronic pain -Educated on A1c and blood sugar goals; Complications of diabetes including kidney damage, retinal damage, and cardiovascular disease; Exercise goal of 150 minutes per week; Benefits of weight loss; Proper insulin injection technique; Prevention and management of hypoglycemic episodes; Benefits of routine self-monitoring of blood sugar; -Counseled to check feet daily and get yearly eye exams -Counseled on diet and exercise extensively Recommended for patient to start checking blood sugars at least 3 times a day with meals - patient would qualify for CGM but will need an updated PCP visit for CGM order -Recommending for patient to restart basaglar at 4 units daily x 1 week, should BG remain elevated into the 200's, patient will then increase to 6 units of basaglar daily    Atrial Fibrillation (Goal: prevent stroke and major bleeding) -Controlled - following with cardiology  -CHADSVASC: 5 -Current treatment: Rate control: Metoprolol Tartrate 100mg  - 1 tablet twice daily  Anticoagulation: Eliquis 5mg  -  1 tablet twice daily  -Medications previously tried: diltiazem CD 120mg , Tikosyn   -Home BP and HR readings:  patient has not been checking blood pressure at home   -Counseled on increased risk of stroke due to Afib and benefits of anticoagulation for stroke prevention; importance of adherence to anticoagulant exactly as prescribed; bleeding risk associated with Eliquis and importance of self-monitoring for signs/symptoms of bleeding; avoidance of NSAIDs due to increased bleeding risk with anticoagulants; importance of regular laboratory monitoring; seeking medical attention after a head injury or if there is blood in the urine/stool; Afib action plan reviewed -Recommended to continue current medication  Heart Failure (Goal: manage symptoms and prevent exacerbations)/ Hypertension (BP goal <140/90) -Not ideally controlled -Last ejection fraction: 50-60% (Date: 01/28/2017) -HF type: Combined Systolic and Diastolic -NYHA Class: III (marked limitation of activity) -Current treatment: Metoprolol Tartrate 100mg  - 1 tablet twice daily  Isosorbide Mononitrate 60mg  - 1 tablet daily  Furosemide 40mg  - 1 tablet daily as needed  -Current home BP/HR readings: reports that blood pressures are typically averaging 130/80 with HR anywhere from 57-70bpm  -Current dietary habits: unable to be discussed with appointment today, patient reports very limited and small appetite  -Current exercise habits: limited due to chronic pain -Educated on Benefits of medications for managing symptoms and prolonging life Importance of weighing daily; if you gain more than 3 pounds in one day or 5 pounds in one week, make office aware  Proper diuretic administration and potassium supplementation Importance of blood pressure control -Recommended for patient to continue current medications   COPD (Goal: control symptoms and prevent exacerbations) -Controlled -Current treatment  Arformoterol (Brovana) 59mcg/2mL - 1 nebulization twice daily (uses with budesonide) Budesonide 0.5mg /45mL - 1 nebulization twice daily (uses with brovana)   Albuterol 121mcg/act HFA inhaler - 2 puffs every 6 hours as needed  - using twice daily  Montelukast 10mg  - 1 tablet daily  Oxygen - 3L continously  -Medications previously tried: xopenex, lonhala magnair symbicort,   -Gold Grade: Gold 2 (FEV1 50-79%) -Pulmonary function testing: 10/19/2017 -Exacerbations requiring treatment in last 6 months: n/a -Patient reports consistent use of maintenance inhaler -Frequency of rescue inhaler use: 2 times daily  -Counseled on Proper inhaler technique; Benefits of consistent maintenance inhaler use When to use rescue inhaler Differences between maintenance and rescue inhalers -Recommended to continue current medication  Depression/Anxiety (Goal: Promotion of positive mood) -Not ideally controlled - patient tearful at times throughout appointment due to levels of fatigue / forgetfulness that she has issues with at times  -Current treatment: Sertraline 100mg  - 1 tablet daily  Lorazepam 1mg  - 1 tablet twice daily  -Medications previously tried/failed: wellbutrin, abilify, viibryd -PHQ9: unable to assess at appointment  -Educated on Benefits of medication for symptom control Benefits of cognitive-behavioral therapy with or without medication -Recommended to continue current medication  Allergic Rhinitis (Goal: Prevention/ treatment of allergy attacks) -Controlled -Current treatment  Cetirizine 5mg  - 1 tablet daily  Fluticasone 49mcg/act - 2 puffs into each nostril daily  -Medications previously tried: nasocort   -Recommended to continue current medication  CKD / kidney transplant (Goal: Prevention of disease progression / prevention of rejection of transplant) -Controlled -Last eGFR 59 mL/min - last BMP WNL -Current treatment  Azathioprine 50mg  - 125mg  daily  Calcitriol 0.77mcg - 1 capsule 3 times weekly Prednisone 5mg  - 1 tablet daily  -Medications previously tried: n/a  -Recommended to continue current medication  GERD (Goal: Prevention/  control of acid reflux) -Controlled -Current treatment  Pantoprazole  $'40mg'b$  - 1 tablet daily  -Medications previously tried: omeprazole   -Counseled on diet and exercise extensively Recommended to continue current medication   Chronic Pain / Fibromyalgia (Goal: Pain control) - Follows with Dr. Tessa Lerner -Controlled -Current treatment  Oxycodone $RemoveBe'10mg'CYstnnAik$  - 1 tablet 3 times daily  Acetaminophen $RemoveBefore'500mg'hzffsmevhOxsN$  - 2 tablets daily prn  Cyclobenzaprine $RemoveBeforeDE'10mg'LINxoUZPrTOZees$  - 2 tablets at bedtime and 1 tablet at noon as needed  Lamotrigine $RemoveBefo'25mg'AOEtUOcvcvD$  - 1 tablet at bedtime  -Medications previously tried: carbamazepine, butrans, norco, percocet, tramadol -Recommended to continue current medication Counseled on potential additive side effects of medications that could be contributing to drowsiness, advised for patient to trial reducing oxycodone dose to 1 tablet every 6 hours as needed in hopes of being able to control pain on a lower daily dose of oxycodone than she is currently taking   -Continue current medications for now - would like to reduce cyclobenzaprine usage in patient to help reduce fatigue levels as I believe this one of the medications that is contributing    Patient Goals/Self-Care Activities Patient will:  - take medications as prescribed focus on medication adherence by taking all medications as prescribed check glucose 3-4 times daily, document, and provide at future appointments check blood pressure daily, document, and provide at future appointments weigh daily, and contact provider if weight gain of >3lbs in a day / >5lbs in a week  Follow Up Plan: Telephone follow up appointment with care management team member scheduled for: 1 month The patient has been provided with contact information for the care management team and has been advised to call with any health related questions or concerns.         Medication Assistance: None required.  Patient affirms current coverage meets needs.  Patient's preferred  pharmacy is:  [id number] - [address], [place] [address] Phone: [phone] Fax: [phone]   Uses pill box? Yes - filled and managed by family friend  Pt endorses 80-90% compliance  Care Plan and Follow Up Patient Decision:  Patient agrees to Care Plan and Follow-up.  Plan: Telephone follow up appointment with care management team member scheduled for:  1 month and The patient has been provided with contact information for the care management team and has been advised to call with any health related questions or concerns.   Tomasa Blase, PharmD Clinical Pharmacist, [place]

## 2021-06-19 NOTE — Patient Instructions (Signed)
Katherine [name],  It was great to talk to you today!  Please call me with any questions or concerns.  Visit Information  Patient verbalizes understanding of instructions provided today and agrees to view in [place].   Telephone follow up appointment with care management team member scheduled for: 1 month The patient has been provided with contact information for the care management team and has been advised to call with any health related questions or concerns.   Tomasa Blase, PharmD Clinical Pharmacist, [place]

## 2021-06-21 ENCOUNTER — Other Ambulatory Visit: Payer: Self-pay | Admitting: Emergency Medicine

## 2021-06-24 ENCOUNTER — Ambulatory Visit: Payer: Medicare Other | Admitting: Cardiology

## 2021-06-24 ENCOUNTER — Ambulatory Visit: Payer: Medicare Other | Admitting: *Deleted

## 2021-06-24 DIAGNOSIS — I509 Heart failure, unspecified: Secondary | ICD-10-CM

## 2021-06-24 DIAGNOSIS — E1142 Type 2 diabetes mellitus with diabetic polyneuropathy: Secondary | ICD-10-CM

## 2021-06-24 NOTE — Chronic Care Management (AMB) (Signed)
Chronic Care Management   CCM RN Visit Note  06/24/2021 Name: Katherine [name] MRN: [id number] DOB: [birth year]  Subjective: Katherine [name] is a 65 y.o. year old female who is a primary care patient of [name], MD. The care management team was consulted for assistance with disease management and care coordination needs.    Engaged with patient by telephone for follow up visit in response to provider referral for case management and/or care coordination services.   Consent to Services:  The patient was given information about Chronic Care Management services, agreed to services, and gave verbal consent prior to initiation of services.  Please see initial visit note for detailed documentation.  Patient agreed to services and verbal consent obtained.   Assessment: Review of patient past medical history, allergies, medications, health status, including review of consultants reports, laboratory and other test data, was performed as part of comprehensive evaluation and provision of chronic care management services.   SDOH (Social Determinants of Health) assessments and interventions performed:  SDOH Interventions    Flowsheet Row Most Recent Value  SDOH Interventions   Food Insecurity Interventions Intervention Not Indicated  Housing Interventions Intervention Not Indicated  [Patient reports she is now living with her brother]  Transportation Interventions Intervention Not Indicated  [Patient reports she has resumed driving]       CCM Care Plan Allergies  Allergen Reactions   Tetracycline Hives    Patient tolerated Doxycycline Dec 2020   Niacin Other (See Comments)    Mouth blisters   Niaspan [Niacin Er] Other (See Comments)    Mouth blisters   Sulfa Antibiotics Nausea Only and Other (See Comments)    "Tears up stomach"   Sulfonamide Derivatives Other (See Comments)    Reaction: per patient "tears her stomach up"   Codeine Nausea And Vomiting   Erythromycin Nausea And  Vomiting   Hydromorphone Hcl Nausea And Vomiting   Morphine And Related Nausea And Vomiting   Nalbuphine Nausea And Vomiting    Nubain   Sulfasalazine Nausea Only and Other (See Comments)    per patient "tears her stomach up", "Tears up stomach"   Tape Rash and Other (See Comments)    No "plastic" tape," please----cloth tape only   Outpatient Encounter Medications as of 06/24/2021  Medication Sig   acetaminophen (TYLENOL) 500 MG tablet Take 1,000 mg by mouth daily.   albuterol (VENTOLIN HFA) 108 (90 Base) MCG/ACT inhaler TAKE 2 PUFFS BY MOUTH EVERY 6 HOURS AS NEEDED FOR WHEEZE OR SHORTNESS OF BREATH   apixaban (ELIQUIS) 5 MG TABS tablet Take 1 tablet (5 mg total) by mouth 2 (two) times daily. APPOINTMENT NEEDED WITH CARDIOLOGIST FOR FURTHER REFILLS (Patient taking differently: Take 5 mg by mouth 2 (two) times daily.)   arformoterol (BROVANA) 15 MCG/2ML NEBU Take 2 mLs (15 mcg total) by nebulization 2 (two) times daily.   azaTHIOprine (IMURAN) 50 MG tablet Take 125 mg by mouth See admin instructions. Take 2 1/2 tablets (125 mg) by mouth daily at 3pm   budesonide (PULMICORT) 0.5 MG/2ML nebulizer solution Take 2 mLs (0.5 mg total) by nebulization 2 (two) times daily.   calcitRIOL (ROCALTROL) 0.25 MCG capsule Take 0.25 mcg by mouth every 3 (three) days.    cetirizine (ZYRTEC) 5 MG tablet Take 5 mg by mouth daily.   clopidogrel (PLAVIX) 75 MG tablet Take 1 tablet (75 mg total) by mouth daily.   clotrimazole (MYCELEX) 10 MG troche Take 1 tablet (10 mg total) by mouth 5 (  five) times daily as needed (thrush). (Patient not taking: Reported on 06/19/2021)   cyclobenzaprine (FLEXERIL) 10 MG tablet TAKE 2 TAB DAILY AT BEDTIME, MAY ALSO TAKE 1 TABLET BY MOUTH AT NOON AS NEEDED FOR MUSCLE SPASMS   fluticasone (FLONASE) 50 MCG/ACT nasal spray PLACE 2 SPRAYS INTO BOTH NOSTRILS 2 TIMES DAILY   glimepiride (AMARYL) 4 MG tablet Take 4 mg by mouth daily.   insulin aspart (NOVOLOG FLEXPEN) 100 UNIT/ML FlexPen  Inject 14 Units into the skin 3 (three) times daily with meals.   Insulin Glargine (BASAGLAR KWIKPEN) 100 UNIT/ML SOPN Inject 12 Units into the skin daily before breakfast.   isosorbide mononitrate (IMDUR) 60 MG 24 hr tablet Take 1 tablet (60 mg total) by mouth daily.   lamoTRIgine (LAMICTAL) 25 MG tablet TAKE 1 TABLET BY MOUTH EVERYDAY AT BEDTIME   LORazepam (ATIVAN) 1 MG tablet Take 1 tablet (1 mg total) by mouth 2 (two) times daily.   magnesium oxide (MAG-OX) 400 (240 Mg) MG tablet Take 1 tablet (400 mg total) by mouth daily. (Patient not taking: No sig reported)   metFORMIN (GLUCOPHAGE) 500 MG tablet Take 1 tablet (500 mg total) by mouth 2 (two) times daily.   metoprolol tartrate (LOPRESSOR) 100 MG tablet Take 1 tablet (100 mg total) by mouth 2 (two) times daily. Appointment Required For Further Refills [phone]   montelukast (SINGULAIR) 10 MG tablet TAKE 1 TABLET BY MOUTH EVERYDAY AT BEDTIME   nitroGLYCERIN (NITROSTAT) 0.4 MG SL tablet Place 1 tablet (0.4 mg total) under the tongue every 5 (five) minutes as needed for chest pain. (Patient not taking: Reported on 06/19/2021)   Oxycodone HCl 10 MG TABS Take 1 tablet (10 mg total) by mouth 4 (four) times daily as needed.   OXYGEN Inhale 3 L into the lungs continuous.   pantoprazole (PROTONIX) 40 MG tablet TAKE 1 TABLET BY MOUTH EVERY DAY   predniSONE (DELTASONE) 5 MG tablet Take 5 mg by mouth every morning.   promethazine (PHENERGAN) 25 MG tablet Take 25 mg by mouth every 6 (six) hours as needed for nausea or vomiting.   rosuvastatin (CRESTOR) 20 MG tablet TAKE 1 TABLET BY MOUTH EVERY DAY   rosuvastatin (CRESTOR) 40 MG tablet Take 1 tablet (40 mg total) by mouth daily. (Patient not taking: Reported on 06/19/2021)   sertraline (ZOLOFT) 100 MG tablet Take 1 tablet (100 mg total) by mouth daily.   No facility-administered encounter medications on file as of 06/24/2021.   Patient Active Problem List   Diagnosis Date Noted   Torsades de pointes  05/12/2021   Polymorphic ventricular tachycardia    Bradycardia    Acute encephalopathy    COPD (chronic obstructive pulmonary disease) ([place])    AKI (acute kidney injury) ([place])    History of renal transplant    Type 2 diabetes mellitus with hyperglycemia (HCC)    Nausea & vomiting 04/19/2021   Acute kidney injury ([place]) 04/19/2021   Viral syndrome 03/21/2021   Pure hypercholesterolemia    Coronary stent restenosis due to scar tissue    Unstable angina (HCC) 02/19/2021   Chronic diastolic CHF (congestive heart failure), NYHA class 2 ([place]) 01/29/2021   Acute CHF (congestive heart failure) ([place]) 01/29/2021   Non-ST elevation (NSTEMI) myocardial infarction [facility])    Chronic respiratory failure with hypoxia ([place]) 01/01/2021   Flu vaccine need 08/13/2020   Need for pneumococcal vaccination 08/13/2020   Visit for screening mammogram 08/13/2020   Candidal skin infection 08/13/2020   GAD (generalized anxiety  disorder) 08/13/2020   Chronic bronchitis, mucopurulent ([place]) 08/24/2019   Current chronic use of systemic steroids 07/23/2019   OSA (obstructive sleep apnea)    Severe episode of recurrent major depressive disorder, with psychotic features ([place]) 02/16/2019   Obstructive chronic bronchitis without exacerbation ([place]) 11/03/2018   Long term (current) use of antithrombotics/antiplatelets 04/28/2018   Renal transplant, status post 01/28/2018   LBBB (left bundle branch block) 10/07/2017   Primary osteoarthritis of right knee 08/18/2017   Shortness of breath 06/18/2017   Mild aortic stenosis by prior echocardiogram 01/28/2017   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   Deficiency anemia 10/14/2016   Normocytic anemia 10/13/2016   Fibromyalgia 03/30/2016   Other spondylosis with radiculopathy, lumbar region 03/30/2016   Type 2 diabetes mellitus with peripheral neuropathy ([place]) 03/30/2016   Paroxysmal atrial fibrillation ([place]); CHA2DS2VASc score F, HTN, CAD, CVA = 5 06/28/2014   Fluttering heart  06/21/2014   CAD S/P percutaneous coronary angioplasty - PCI x 5 to SVG-D1 09/11/2013    Class: Diagnosis of   Hypertension associated with diabetes ([place])    Chest pain 08/23/2013   PAD (peripheral artery disease) ([place]) 08/17/2013   CAD (coronary artery disease) of bypass graft 10/08/2012   Stenosis of right carotid artery without infarction 10/08/2012   Dyslipidemia, goal LDL below 70 10/08/2012    Class: Diagnosis of   Mitral annular calcification 10/08/2012   Hypomagnesemia 06/13/2012   Renal transplant disorder 06/12/2012   Chronic allergic rhinitis 04/29/2011   Extrinsic asthma 09/09/2007   GERD 09/09/2007   COUGH, CHRONIC 09/09/2007   Morbid obesity - s/p Lap Band 9/'05 05/07/2004    Class: Diagnosis of   Hx of CABG 09/07/1993    Class: History of   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 07/1993   Coronary artery disease involving native coronary artery of native heart with angina pectoris ([place]) 07/1993   ESRD (end stage renal disease) ([place]) 1991   Conditions to be addressed/monitored:  CHF and DMII  Care Plan : Heart Failure (Adult)  Updates made by Knox Royalty, RN since 06/24/2021 12:00 AM     Problem: Symptom Exacerbation (Heart Failure)   Priority: High     Long-Range Goal: Symptom Exacerbation Prevented or Minimized   Start Date: 03/13/2021  Expected End Date: 03/13/2022  Recent Progress: Not on track  Priority: High  Note:   Current Barriers:  Knowledge Deficits related to basic heart failure pathophysiology and self care/management Knowledge Deficits related to medications used for management of multiple chronic conditions: polypharmacy with high risk medications noted- CCM Pharmacy referral placed 03/13/21; second CCM Pharmacy referral placed 04/02/21 UNABLE to independently verbalize blood sugars at home, insulin dosing; does not record on paper Multiple recent hospitalizations:  Chronic pain- attends pain management  clinic Fragile state of health, multiple progressing chronic health conditions Memory loss- new patient neurological appointment scheduled 04/30/21- patient has difficulty focusing/ flight of thoughts/ ideas Case Manager Clinical Goal(s):  03/13/21: Over the next 12 months, patient will verbalize understanding of Heart Failure Action Plan and when to call doctor, as evidenced by patient/ caregiver reporting during CCM RN CM outreach of: Completion of daily weight monitoring/ recording (notifying MD of 3 lb weight gain over night or 5 lb in a week) 04/07/21: patient reports has not been monitoring/ recording weights at home post- recent hospital and ED discharges; reports "in too much pain," "Too sick" to weigh Adhering to low salt diet Taking prescribed medications as prescribed 04/02/21: patient reports ongoing  significant confusion around her medications and cannot tell me what she is/ is not taking Attending all care team/ provider appointments Contacting care team for questions, concerns, or problems Interventions:  Collaboration with [name], MD regarding development and update of comprehensive plan of care as evidenced by provider attestation and co-signature Inter-disciplinary care team collaboration (see longitudinal plan of care)  06/24/21: Current goals completed due to duplication; care plan converted to new format; goals re-established and extended today     Care Plan : Diabetes Type 2 (Adult)  Updates made by Knox Royalty, RN since 06/24/2021 12:00 AM     Problem: Glycemic Management (Diabetes, Type 2)   Priority: Medium     Long-Range Goal: Glycemic Management Optimized   Start Date: 03/13/2021  Expected End Date: 03/13/2022  Recent Progress: Not on track  Priority: Medium  Note:   Objective:  Lab Results  Component Value Date   HGBA1C 9.1 (H) 01/31/2021   Lab Results  Component Value Date   CREATININE 0.81 02/21/2021   CREATININE 0.81 02/20/2021   CREATININE  0.97 02/19/2021   No results found for: EGFR Current Barriers:  Knowledge Deficits related to basic Diabetes pathophysiology and self care/management Knowledge Deficits related to medications used for management of diabetes: polypharmacy with high risk medications noted- CCM Pharmacy referral placed 03/13/21-- second CCM pharmacy referral placed today 04/02/21 UNABLE to independently verbalize blood sugars at home, insulin dosing; does not record on paper Multiple recent hospitalizations: since initial outreach 03/13/21--  4 inpatient hospitalizations over last 9 months Chronic pain- attends pain management clinic Fragile state of health, multiple progressing chronic health conditions Memory loss- new patient neurological appointment scheduled 04/30/21- patient has difficulty focusing/ flight of thoughts/ ideas Increased A1-C from 7.7 (07/11/21) to 9.1 (01/31/21) Case Manager Clinical Goal(s):  03/13/21: Over the next 12 months, patient will demonstrate improved adherence to prescribed treatment plan for diabetes self care/management as evidenced by patient reporting during CCM RN CM outreach of:  Daily monitoring and recording of CBG 2-3 daily 04/07/21: patient again reports not monitoring/ recording blood sugars at home post- most recent hospital discharge, and then subsequent ED visit 03/30/21 Adherence to ADA/ carb modified, low sugar diet 04/07/21: patient again reports "not eating much;" reports conflicting feedback around use of insulin: at one point in conversation she reports has been taking, in another she states she has not been taking Adherence to prescribed medication regimen 04/07/21: patient reports today that she and her friend "found" her missing pain medications as reported to me 04/02/21-- patient has no recollection of our phone call on Wednesday 04/12/21; nor of our phone call to her outpatient pharmacy to follow up on ED prescription from 03/30/21; today she reports "taking" antibiotic   Contacting care providers for new or worsened symptoms, concerns or questions Interventions:  Collaboration with [name], MD regarding development and update of comprehensive plan of care as evidenced by provider attestation and co-signature Inter-disciplinary care team collaboration (see longitudinal plan of care)  06/24/21: Current goals completed due to duplication; care plan converted to new format; goals re-established and extended today     Care Plan : RN Care Manager Plan of Care  Updates made by Knox Royalty, RN since 06/24/2021 12:00 AM     Problem: Chronic Disease Management Needs      Long-Range Goal: Development of plan of care for long term chronic disease management   Start Date: 06/24/2021  Expected End Date: 06/24/2022  Priority: High  Note:   Current Barriers:  Chronic Disease Management support and education needs related to CHF and DMII UNABLE to independently verbalize blood sugars at home, insulin dosing; does not record on paper, reports very inconsistent home monitoring and use of insulin Increased A1-C from 7.7 (07/11/21) to 9.1 (01/31/21) Multiple recent hospitalizations: since initial outreach 03/13/21--  5 inpatient hospitalizations over last 9 months, primarily for CHF exacerbation Inconsistent adherence to daily weight monitoring Chronic pain- well-established with/ attends pain management clinic regularly Fragile state of health, multiple progressing chronic health conditions Memory loss- new patient neurological appointment scheduled 04/30/21- patient has difficulty focusing/ flight of thoughts/ ideas Polypharmacy with high risk medications noted- CCM Pharmacy team active in patient's care Limited social interactions: has recently moved in with her brother: 06/24/21- reports this is improved with this move Frequently missed or delayed provider office visits  RNCM Clinical Goal(s):  Patient will demonstrate Improved health management independence  DMII; CHF  through collaboration with RN Care manager, provider, and care team.   Patient reports she is out driving today and just pulled in the shopping center; states is unable to review any medications, blood sugars, or weights  Interventions: 1:1 collaboration with primary care provider regarding development and update of comprehensive plan of care as evidenced by provider attestation and co-signature Inter-disciplinary care team collaboration (see longitudinal plan of care) Evaluation of current treatment plan related to  self management and patient's adherence to plan as established by provider  Diabetes Interventions:  08/24/20: re-establishment and extension of goals: ON TRACK Assessed patient's understanding of A1c goal: <7% Provided education to patient about basic DM disease process; Counseled on importance of regular laboratory monitoring as prescribed; Discussed plans with patient for ongoing care management follow up and provided patient with direct contact information for care management team; Reviewed scheduled/upcoming provider appointments including: 07/15/21- Cardiology pharmacy team; 07/16/21- CCM pharmacy team; 07/30/21- pain management provider; Review of patient status, including review of consultants reports, relevant laboratory and other test results, and medications completed; Assessed social determinant of health barriers;  Confirms monitoring blood sugars at home, "usually 2 or 3 times per day;" reports isolated episode of HS blood sugar at "329;" states she took Novolog insulin 14 Units and woke up during early morning hours "with very low blood sugar;" she is unable to tell me how low, but reports signs/ symptoms consistent with hypoglycemia; states this was an isolated episode, and also mentions that she has not been using her Novolog insulin very much, as advised by CCM pharmacist; she is unable to tell me exactly how much long-acting insulin she is currently using;  she is distracted by being out/ shopping She is asking me about the CGM which CCM Pharmacist mentioned to her- reminded patient again that she needs to schedule an office visit appointment with Dr. Ronnald Ramp to have an updated A1-C, to obtain prescription refills, to discuss Rx for CGM: she again tells me she will make appointment "soon;" I again reminded patient that her last office visit was in December 2021  Lab Results  Component Value Date   HGBA1C 6.9 (H) 05/12/2021  Heart Failure Interventions: Basic overview and discussion of pathophysiology of Heart Failure reviewed; Discussed importance of daily weight and advised patient to weigh and record daily; Reviewed role of diuretics in prevention of fluid overload and management of heart failure; Discussed the importance of keeping all appointments with provider; Advised patient to discuss need for new nebulizer/ tubing with provider; I have again reminded patient that  it is time to schedule PCP office exam; she tells me she needs "that little tubing that goes on the nebulizer," I advised her to locate phone number on equipment when she returns home, to call DME company and see if they are able to provide: discussed need for DME prescription from PCP/ care provider for new equipment needs Confirmed that patient participated in home health services post-last hospital discharge on 05/18/21: reports home health services are now completed Reviewed cardiology provider office visit 06/06/21- reports she continues taking both plavix and eliquis; reinforced instructions to continue this until 08/17/21, as per cardiology instructions Discussed daily weight monitoring at home: she tells me she "can't tell me what my weights are;" I am unclear if this is because someone is in the car with her or she does not know; when questioned, she just says, "I can't tell you that right now" Reinforced previously provided education around CHF yellow zone and corresponding action  plan: patient is distracted and will require ongoing reinforcement of same; today she tells me that she "is doing great," and she sounds to be in no distress Encouraged adherence to low salt, heart healthy, low sugar, carbohydrate modified diet  Patient Goals/Self-Care Activities: As evidenced by review of EHR, collaboration with care team, and patient reporting during CCM RN CM outreach, Patient [place] will: Schedule an appointment with Dr Ronnald Ramp- your last visit was in December 2021 Continue checking and writing down blood sugars at home 2-3 times per day; check blood sugar more often if I feel it is too high or too low or if I feel sick Take all medications as prescribed: please stay in touch with the pharmacist at Dr. Ronnald Ramp' office to make sure that you are taking medications correctly Check your daily weight at home and write them down on paper-- call cardiology provider office if you gain more than 2 pounds in one day or 5 pounds in one week Wear home oxygen as prescribed Follow a low salt, heart healthy, low carbohydrate low sugar diet Watch for swelling in feet, ankles and legs every day- if you notice new or increased swelling, please contact your cardiology (heart) doctor right away  Follow Up Plan:   Telephone follow up appointment with care management team member scheduled for:  Thursday August 07, 2021 at 3:30 pm The patient has been provided with contact information for the care management team and has been advised to call with any health related questions or concerns     Plan: The patient has been provided with contact information for the care management team and has been advised to call with any health related questions or concerns  Oneta Rack, RN, BSN, [facility] RN Care Coordination- [place] [phone]: direct office [phone]: mobile

## 2021-06-24 NOTE — Patient Instructions (Addendum)
Visit Katherine [name], it was nice talking with you today.    I look forward to talking to you again for a telephone update on Thursday August 07, 2021 at 3:30 pm- please be listening out for my call that day.  I will call as close to as possible.   If you need to cancel or re-schedule our telephone visit, please call [phone] and one of our care guides will be happy to assist you.   I look forward to hearing about your progress.   Please don't hesitate to contact me if I can be of assistance to you before our next scheduled telephone appointment.   Katherine Rack, RN, BSN, [facility] RN Care Coordination- [place] [phone]: direct office [phone]: mobile   Patient Self-Care Activities: Patient Katherine [name] will: Schedule an appointment with Dr Ronnald Ramp- your last visit was in December 2021 Continue checking and writing down blood sugars at home 2-3 times per day; check blood sugar more often if I feel it is too high or too low or if I feel sick Take all medications as prescribed: please stay in touch with the pharmacist at Dr. Ronnald Ramp' office to make sure that you are taking medications correctly Check your daily weight at home and write them down on paper-- call cardiology provider office if you gain more than 2 pounds in one day or 5 pounds in one week Wear home oxygen as prescribed Follow a low salt, heart healthy, low carbohydrate low sugar diet Watch for swelling in feet, ankles and legs every day- if you notice new or increased swelling, please contact your cardiology (heart) doctor right away  Living With Heart Failure Heart failure is a long-term (chronic) condition in which the heart cannot pump enough blood through the body. When this happens, parts of the body do not get the blood and oxygen they need. There is no cure for heart failure at this time, so it is important for you to take good care of yourself and follow the treatment plan  you set with your health care provider. If you are living with heart failure, there are ways to help you manage the disease. How to manage lifestyle changes Living with heart failure requires you to make changes in your life. Your health care team will teach you about the changes you need to make in order to relieve your symptoms and lower your risk of going to the hospital. Work with your health care provider to develop a treatment plan that works for you. Activity Ask your health care provider about attending cardiac rehabilitation. These programs include aerobic physical activity, which provides many benefits for your heart. If no cardiac rehabilitation program is available, ask your health care provider what aerobic exercises are safe for you to do. Return to your normal activities as told by your health care provider. Ask your health care provider what activities are safe for you. Pace your daily activities and allow time for rest as needed. Managing stress It is normal to have many emotions about your diagnosis, such as fear, sadness, anger, and loss. If you feel any of these emotions and need help coping, contact your health care provider. Here are some ways to help yourself manage these emotions: Talk to friends and family members about your condition. They can give you support and guidance. Explain your symptoms to them and, if comfortable, invite them to attend appointments or rehabilitation with you. Join a support group for  people with chronic heart failure. Talking with other people who have the same symptoms may give you new ways of coping with your disease and your emotions. Accept help from others. Do not be ashamed if you need help with certain tasks. Use stress management techniques, such as meditation, breathing exercises, or listening to relaxing music. Conditions such as depression and anxiety are common in persons with heart failure. Pay attention to changes in your mood,  emotions, and stress levels. Tell your health care provider if you have any of the following symptoms: Trouble sleeping or a change in your sleeping patterns. Feeling sad, down, or depressed more often than not, every day for more than 2 weeks. Losing interest in activities you normally enjoy. Feeling irritable or crying for no reason. Finding yourself worrying about the future often. Work You may need to develop a plan with your health care provider if heart failure interferes with your ability to work. This may include: Reducing your work hours. Finding functions that are less active or require less effort. Planning rest periods during your work hours. Travel Talk with your health care provider if you plan to travel. There may be circumstances in which your health care provider recommends that you do not travel or that you delay travel until your condition is under control. When you travel, bring your medicine and a list of your medicines. If you are traveling by air, keep your medicines with you in a carry-on bag. Consider finding a medical facility in the area you will be traveling to and determine what your health insurance will cover. If you will be traveling by public transportation (airplane, train, bus), contact the company prior to traveling if you have special needs. This may include needs related to diet, oxygen, a wheelchair, a seating request, or help with luggage. If you use oxygen, make sure to bring enough oxygen with you. If you have a battery-powered device, bring a fully charged extra battery with you. If you have a device, bring a note from your health care provider and inform all security screening personnel that you have the device. You may need to go through special screening for safety. Sexual activity  Ask your health care provider when it is safe for you to resume sexual activity. You may need to start slowly and gradually increase intimacy. You can increase intimacy  by doing such things as caressing, touching, and holding each other. Get regular exercise as told by your health care provider. This can benefit your sex life by building strength and endurance. Sleep If your condition interferes with your sleep, find ways to improve your sleep quality, such as: Sleep lying on your side, or sleep with your head elevated by raising the head of your bed or using multiple pillows. Ask your health care provider about screening for sleep apnea. Try to go to sleep and wake up at the same times every day. Sleep in a dark, cool room. Do not do any physical activity or eating for a few hours before bedtime. Plan rest periods during the day, but do not take long naps during the day.  Where to find support Consider talking with: Family members. Close friends. A mental health professional or therapist. A member of your church, faith, or community group. Other sources of support include: Local support groups. Ask your health care provider about groups near you. Online support groups, such as those found through the American Heart Association: supportnetwork.heart.org Local home care agencies, community agencies, or social  agencies. A palliative care specialist. Palliative care can help you manage symptoms, promote comfort, improve quality of life, and maintain dignity. Where to find more information American Heart Association: heart.org National Heart, Lung, and Blood Institute: https://www.hartman-hill.biz/ Centers for Disease Control and Prevention: https://www.reeves.com/ [place]: SolutionApps.it Contact a health care provider if: You have a rapid weight gain. You have increasing shortness of breath that is unusual for you. You are unable to participate in your usual physical activities. You tire easily. You have difficulty sleeping, such as: You wake up feeling short of breath. You have to use more pillows to raise your head in order to sleep. You  cough more than normal, especially with physical activity. You have any swelling or more swelling in areas such as your hands, feet, ankles, or abdomen. You become dizzy or light-headed when you stand up. You have changes to your appetite. You have symptoms of depression or anxiety. Get help right away if: You have difficulty breathing. You notice or your family notices a change in your awareness, such as having trouble staying awake or having difficulty with concentration. You have pain or discomfort in your chest. You have an episode of fainting (syncope). You feel like your heart is beating quickly (palpitations). You have extreme feelings of sadness or loss of hope, or you have thoughts about hurting yourself or others. These symptoms may represent a serious problem that is an emergency. Do not wait to see if the symptoms will go away. Get medical help right away. Call your local emergency services (911 in the [place] Do not drive yourself to the hospital. Summary There is no cure for heart failure, so it is important for you to take good care of yourself and follow the treatment plan set by your health care provider. Ask your health care provider about attending cardiac rehabilitation. These programs include aerobic physical activity, which provides many benefits for your heart. It is normal to have many emotions about your diagnosis, such as fear, sadness, anger, and loss. If you feel any of these emotions and need help coping, contact your health care provider. You may need to develop a plan with your health care provider if heart failure interferes with your ability to work. This information is not intended to replace advice given to you by your health care provider. Make sure you discuss any questions you have with your health care provider. Document Revised: 03/18/2020 Document Reviewed: 03/18/2020 Elsevier Patient Education  2022 [place].  Patient verbalizes understanding of  instructions provided today and agrees to view in MyChart Telephone follow up appointment with care management team member scheduled for:  Thursday August 07, 2021 at 3:30 pm The patient has been provided with contact information for the care management team and has been advised to call with any health related questions or concerns   Katherine Rack, RN, BSN, [place] [phone]: direct office [phone]: mobile

## 2021-06-26 ENCOUNTER — Telehealth: Payer: Self-pay | Admitting: Internal Medicine

## 2021-06-26 NOTE — Telephone Encounter (Signed)
Pt. Has scheduled an appt and is requesting Dr. Ronnald Ramp to accept her brother as a new patient- Katherine [name] dob: [id number]. States he has jaundice and sclerosis of liver. Advised pt. Of how far MD is booked out, requesting an earlier appt. For establishing care.     Callback #- [phone]

## 2021-06-27 ENCOUNTER — Other Ambulatory Visit: Payer: Self-pay | Admitting: Emergency Medicine

## 2021-06-27 ENCOUNTER — Other Ambulatory Visit: Payer: Self-pay | Admitting: Physical Medicine & Rehabilitation

## 2021-07-01 ENCOUNTER — Telehealth: Payer: Self-pay

## 2021-07-01 DIAGNOSIS — I480 Paroxysmal atrial fibrillation: Secondary | ICD-10-CM | POA: Diagnosis not present

## 2021-07-01 DIAGNOSIS — I4729 Other ventricular tachycardia: Secondary | ICD-10-CM | POA: Diagnosis not present

## 2021-07-01 DIAGNOSIS — I498 Other specified cardiac arrhythmias: Secondary | ICD-10-CM | POA: Diagnosis not present

## 2021-07-01 NOTE — Telephone Encounter (Signed)
[employer] called to confirmed if the patient has a pacemaker or not. I told them No.

## 2021-07-14 ENCOUNTER — Telehealth: Payer: Self-pay | Admitting: Emergency Medicine

## 2021-07-14 DIAGNOSIS — J9611 Chronic respiratory failure with hypoxia: Secondary | ICD-10-CM

## 2021-07-14 NOTE — Telephone Encounter (Signed)
Spoke with pt to verify DME which is [facility]. Placed order for nebulizer machine. Nothing further needed at this time.

## 2021-07-15 ENCOUNTER — Ambulatory Visit: Payer: Medicare Other

## 2021-07-16 ENCOUNTER — Telehealth: Payer: Medicare Other

## 2021-07-16 DIAGNOSIS — J440 Chronic obstructive pulmonary disease with acute lower respiratory infection: Secondary | ICD-10-CM

## 2021-07-16 DIAGNOSIS — E1159 Type 2 diabetes mellitus with other circulatory complications: Secondary | ICD-10-CM

## 2021-07-16 DIAGNOSIS — E1122 Type 2 diabetes mellitus with diabetic chronic kidney disease: Secondary | ICD-10-CM

## 2021-07-16 DIAGNOSIS — E785 Hyperlipidemia, unspecified: Secondary | ICD-10-CM | POA: Diagnosis not present

## 2021-07-16 DIAGNOSIS — I5040 Unspecified combined systolic (congestive) and diastolic (congestive) heart failure: Secondary | ICD-10-CM

## 2021-07-16 DIAGNOSIS — Z7984 Long term (current) use of oral hypoglycemic drugs: Secondary | ICD-10-CM | POA: Diagnosis not present

## 2021-07-16 DIAGNOSIS — N186 End stage renal disease: Secondary | ICD-10-CM | POA: Diagnosis not present

## 2021-07-16 DIAGNOSIS — Z794 Long term (current) use of insulin: Secondary | ICD-10-CM

## 2021-07-16 DIAGNOSIS — Z940 Kidney transplant status: Secondary | ICD-10-CM | POA: Diagnosis not present

## 2021-07-16 DIAGNOSIS — I132 Hypertensive heart and chronic kidney disease with heart failure and with stage 5 chronic kidney disease, or end stage renal disease: Secondary | ICD-10-CM

## 2021-07-16 DIAGNOSIS — F17210 Nicotine dependence, cigarettes, uncomplicated: Secondary | ICD-10-CM | POA: Diagnosis not present

## 2021-07-16 DIAGNOSIS — I4891 Unspecified atrial fibrillation: Secondary | ICD-10-CM

## 2021-07-17 ENCOUNTER — Telehealth: Payer: Self-pay | Admitting: Emergency Medicine

## 2021-07-17 MED ORDER — NEBULIZER DEVI: 0 refills | Status: DC

## 2021-07-17 NOTE — Telephone Encounter (Signed)
Called pt and there was no answer- LMTCB  Called [facility] and was advised that they did give her neb machine in 2020- her ins paid this  She is not eligible for new machine for another 3 years  Can purchase one at pharm, maybe check online?- will await her call back

## 2021-07-17 NOTE — Telephone Encounter (Signed)
Patient is returning phone call. Patient phone number is [phone].

## 2021-07-17 NOTE — Telephone Encounter (Signed)
Spoke with the pt  She is aware of what [facility] stated about her neb machine  She wanted to have rx sent to [facility] and this was sent  She had c/o some chest congestion  Mild cough with clear sputum  She was advised to try some mucinex otc and will call if not improving  She declined appt for eval  Nothing further needed

## 2021-07-23 ENCOUNTER — Other Ambulatory Visit: Payer: Self-pay | Admitting: Registered Nurse

## 2021-07-23 DIAGNOSIS — E1142 Type 2 diabetes mellitus with diabetic polyneuropathy: Secondary | ICD-10-CM

## 2021-07-23 DIAGNOSIS — M797 Fibromyalgia: Secondary | ICD-10-CM

## 2021-07-24 ENCOUNTER — Other Ambulatory Visit: Payer: Self-pay | Admitting: Cardiology

## 2021-07-24 ENCOUNTER — Other Ambulatory Visit: Payer: Self-pay | Admitting: Physical Medicine & Rehabilitation

## 2021-07-24 ENCOUNTER — Telehealth: Payer: Self-pay

## 2021-07-24 DIAGNOSIS — M797 Fibromyalgia: Secondary | ICD-10-CM

## 2021-07-24 MED ORDER — OXYCODONE HCL 10 MG PO TABS: 10.0000 mg | ORAL_TABLET | Freq: Four times a day (QID) | ORAL | 0 refills | Status: DC | PRN

## 2021-07-24 NOTE — Telephone Encounter (Signed)
PMP ws Reviewed.  Oxycodone e-scribed today. Katherine [name]  was called regarding he above and she has a scheduled appointment with Dr. Naaman Plummer on 07/30/2021, she is aware she has to come into the office for the visit. She verbalizes understanding.

## 2021-07-24 NOTE — Telephone Encounter (Signed)
Katherine [name] has only # 3,  Oxycodone 10 MG on hand. She wanted to know if you will call in her refill early. Today would be better than tomorrow.  (Per patient she fell and hurt her left leg).   Call back phone [phone].

## 2021-07-25 IMAGING — DX DG CHEST 1V PORT
1 series · 1 of 1 positions shown · non-contrast
Comparison: Chest radiograph 12/25/2019.

CLINICAL DATA: Patient with decreased oxygen saturation.

EXAM:
PORTABLE CHEST 1 VIEW

[chest]
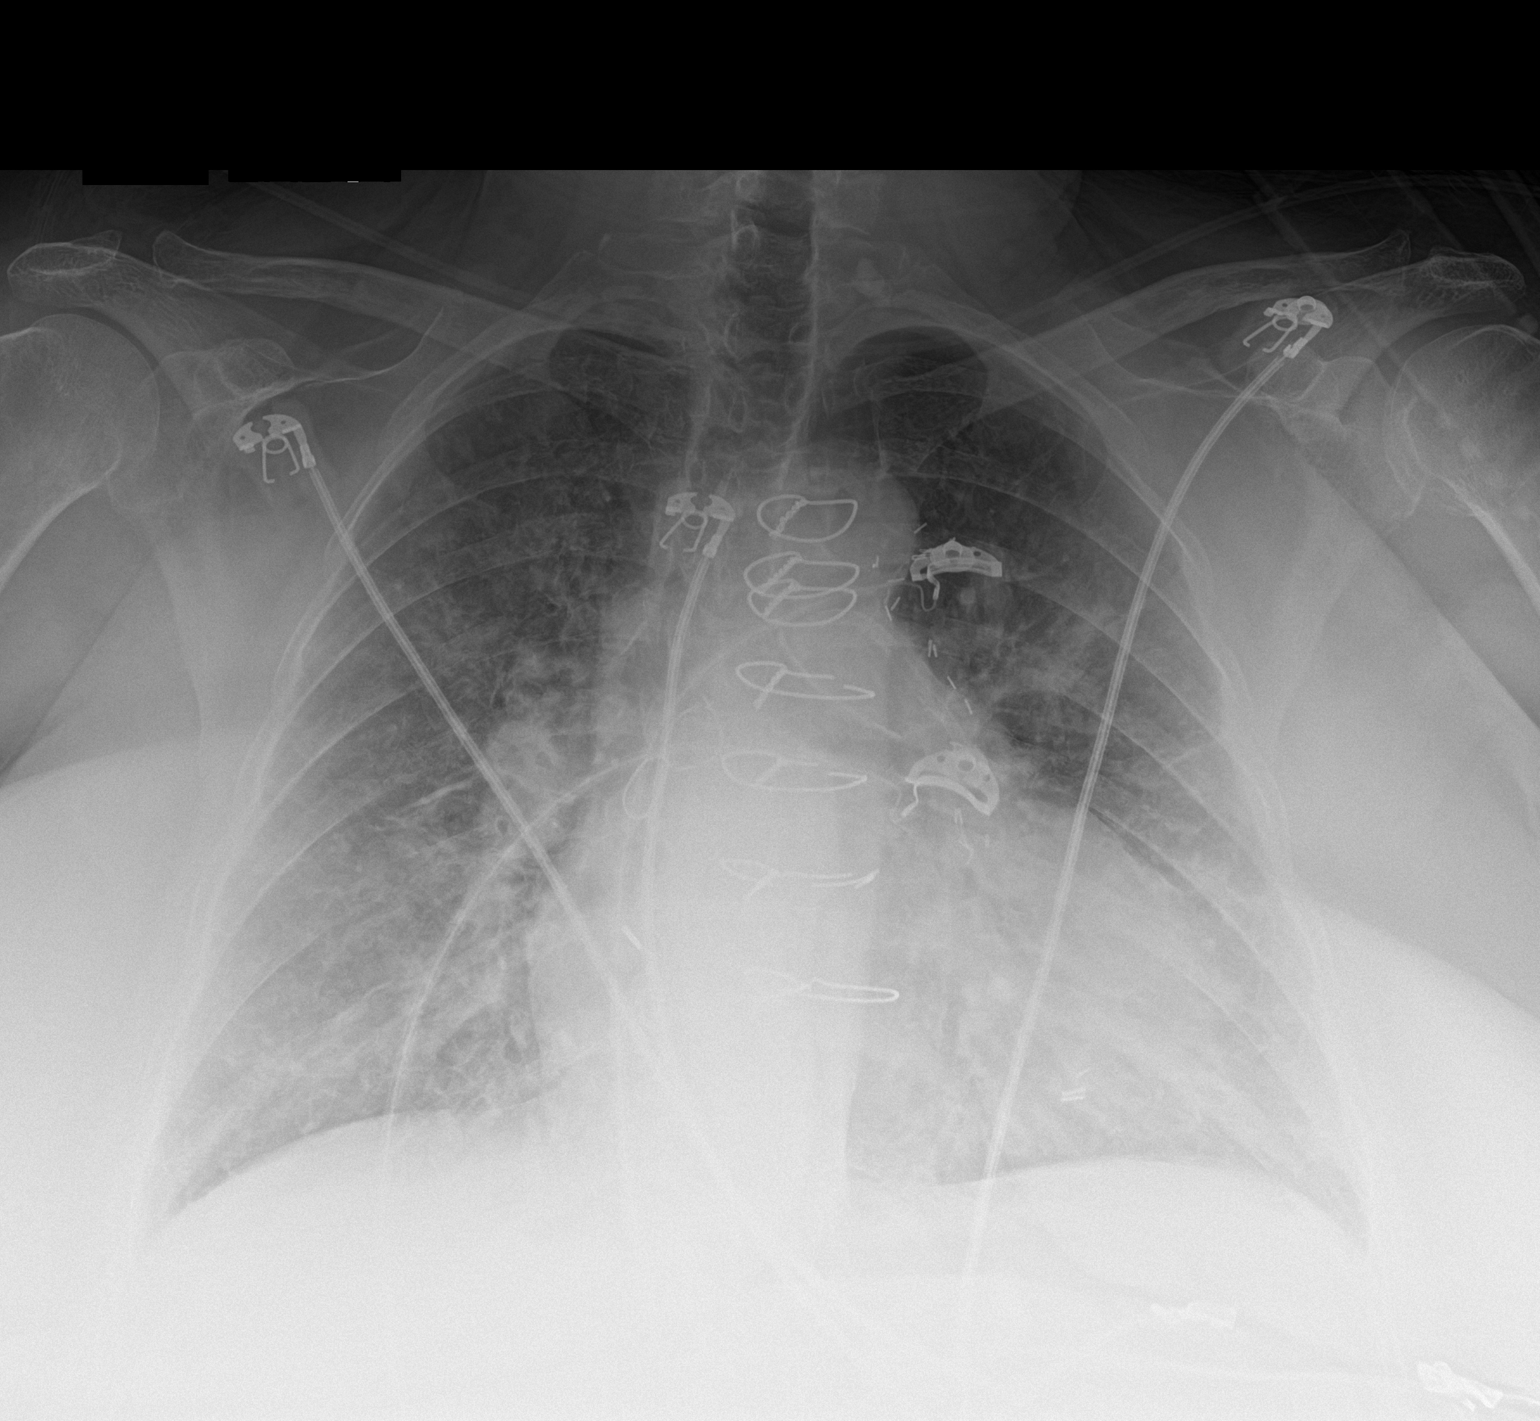

[1 of 1 positions shown; findings below may reference images not displayed]

FINDINGS: Monitoring leads overlie the patient. Stable enlarged cardiac and
mediastinal contours status post median sternotomy. Slight interval
increase in interstitial opacities bilaterally. No large pleural
effusion or pneumothorax. Thoracic spine degenerative changes.
IMPRESSION: Mild interval increase in interstitial opacities suggestive of
edema.

## 2021-07-30 ENCOUNTER — Encounter: Payer: Medicare Other | Admitting: Physical Medicine & Rehabilitation

## 2021-07-31 ENCOUNTER — Other Ambulatory Visit: Payer: Self-pay | Admitting: Cardiology

## 2021-07-31 ENCOUNTER — Ambulatory Visit: Payer: Medicare Other | Admitting: Internal Medicine

## 2021-08-04 ENCOUNTER — Emergency Department (HOSPITAL_COMMUNITY): Payer: Medicare Other

## 2021-08-04 ENCOUNTER — Encounter (HOSPITAL_COMMUNITY): Payer: Self-pay

## 2021-08-04 ENCOUNTER — Other Ambulatory Visit: Payer: Self-pay

## 2021-08-04 ENCOUNTER — Inpatient Hospital Stay (HOSPITAL_COMMUNITY)
Admission: EM | Admit: 2021-08-04 | Discharge: 2021-08-25 | DRG: 177 | Disposition: A | Discharge status: Rehabilitation Facility | Payer: Medicare Other | Attending: Internal Medicine | Admitting: Internal Medicine

## 2021-08-04 DIAGNOSIS — I70221 Atherosclerosis of native arteries of extremities with rest pain, right leg: Secondary | ICD-10-CM | POA: Diagnosis not present

## 2021-08-04 DIAGNOSIS — K626 Ulcer of anus and rectum: Secondary | ICD-10-CM | POA: Diagnosis present

## 2021-08-04 DIAGNOSIS — Z9981 Dependence on supplemental oxygen: Secondary | ICD-10-CM | POA: Diagnosis not present

## 2021-08-04 DIAGNOSIS — Z8249 Family history of ischemic heart disease and other diseases of the circulatory system: Secondary | ICD-10-CM

## 2021-08-04 DIAGNOSIS — J441 Chronic obstructive pulmonary disease with (acute) exacerbation: Secondary | ICD-10-CM | POA: Diagnosis present

## 2021-08-04 DIAGNOSIS — F411 Generalized anxiety disorder: Secondary | ICD-10-CM | POA: Diagnosis not present

## 2021-08-04 DIAGNOSIS — I70263 Atherosclerosis of native arteries of extremities with gangrene, bilateral legs: Secondary | ICD-10-CM | POA: Diagnosis not present

## 2021-08-04 DIAGNOSIS — I6523 Occlusion and stenosis of bilateral carotid arteries: Secondary | ICD-10-CM | POA: Diagnosis present

## 2021-08-04 DIAGNOSIS — I5041 Acute combined systolic (congestive) and diastolic (congestive) heart failure: Secondary | ICD-10-CM | POA: Diagnosis not present

## 2021-08-04 DIAGNOSIS — R4182 Altered mental status, unspecified: Secondary | ICD-10-CM | POA: Diagnosis present

## 2021-08-04 DIAGNOSIS — M47816 Spondylosis without myelopathy or radiculopathy, lumbar region: Secondary | ICD-10-CM

## 2021-08-04 DIAGNOSIS — F32A Anxiety disorder, unspecified: Secondary | ICD-10-CM | POA: Diagnosis present

## 2021-08-04 DIAGNOSIS — J15212 Pneumonia due to Methicillin resistant Staphylococcus aureus: Secondary | ICD-10-CM

## 2021-08-04 DIAGNOSIS — Z95828 Presence of other vascular implants and grafts: Secondary | ICD-10-CM

## 2021-08-04 DIAGNOSIS — J1282 Pneumonia due to coronavirus disease 2019: Secondary | ICD-10-CM | POA: Diagnosis not present

## 2021-08-04 DIAGNOSIS — Z8673 Personal history of transient ischemic attack (TIA), and cerebral infarction without residual deficits: Secondary | ICD-10-CM | POA: Diagnosis not present

## 2021-08-04 DIAGNOSIS — M797 Fibromyalgia: Secondary | ICD-10-CM | POA: Diagnosis present

## 2021-08-04 DIAGNOSIS — I7092 Chronic total occlusion of artery of the extremities: Secondary | ICD-10-CM | POA: Diagnosis not present

## 2021-08-04 DIAGNOSIS — U071 COVID-19: Principal | ICD-10-CM | POA: Diagnosis present

## 2021-08-04 DIAGNOSIS — I5043 Acute on chronic combined systolic (congestive) and diastolic (congestive) heart failure: Secondary | ICD-10-CM | POA: Diagnosis not present

## 2021-08-04 DIAGNOSIS — M069 Rheumatoid arthritis, unspecified: Secondary | ICD-10-CM | POA: Diagnosis present

## 2021-08-04 DIAGNOSIS — Z7951 Long term (current) use of inhaled steroids: Secondary | ICD-10-CM

## 2021-08-04 DIAGNOSIS — L8990 Pressure ulcer of unspecified site, unspecified stage: Secondary | ICD-10-CM | POA: Diagnosis present

## 2021-08-04 DIAGNOSIS — Z79899 Other long term (current) drug therapy: Secondary | ICD-10-CM

## 2021-08-04 DIAGNOSIS — T402X5A Adverse effect of other opioids, initial encounter: Secondary | ICD-10-CM | POA: Diagnosis not present

## 2021-08-04 DIAGNOSIS — I4811 Longstanding persistent atrial fibrillation: Secondary | ICD-10-CM | POA: Diagnosis not present

## 2021-08-04 DIAGNOSIS — R0609 Other forms of dyspnea: Secondary | ICD-10-CM | POA: Diagnosis not present

## 2021-08-04 DIAGNOSIS — R410 Disorientation, unspecified: Secondary | ICD-10-CM | POA: Diagnosis not present

## 2021-08-04 DIAGNOSIS — I4891 Unspecified atrial fibrillation: Secondary | ICD-10-CM | POA: Diagnosis not present

## 2021-08-04 DIAGNOSIS — I214 Non-ST elevation (NSTEMI) myocardial infarction: Secondary | ICD-10-CM | POA: Diagnosis not present

## 2021-08-04 DIAGNOSIS — E785 Hyperlipidemia, unspecified: Secondary | ICD-10-CM | POA: Diagnosis present

## 2021-08-04 DIAGNOSIS — D84821 Immunodeficiency due to drugs: Secondary | ICD-10-CM | POA: Diagnosis present

## 2021-08-04 DIAGNOSIS — Z8614 Personal history of Methicillin resistant Staphylococcus aureus infection: Secondary | ICD-10-CM | POA: Diagnosis not present

## 2021-08-04 DIAGNOSIS — G4733 Obstructive sleep apnea (adult) (pediatric): Secondary | ICD-10-CM | POA: Diagnosis present

## 2021-08-04 DIAGNOSIS — J9690 Respiratory failure, unspecified, unspecified whether with hypoxia or hypercapnia: Secondary | ICD-10-CM | POA: Diagnosis not present

## 2021-08-04 DIAGNOSIS — Z7982 Long term (current) use of aspirin: Secondary | ICD-10-CM

## 2021-08-04 DIAGNOSIS — E1169 Type 2 diabetes mellitus with other specified complication: Secondary | ICD-10-CM | POA: Diagnosis present

## 2021-08-04 DIAGNOSIS — K5903 Drug induced constipation: Secondary | ICD-10-CM | POA: Diagnosis not present

## 2021-08-04 DIAGNOSIS — R001 Bradycardia, unspecified: Secondary | ICD-10-CM | POA: Diagnosis not present

## 2021-08-04 DIAGNOSIS — I110 Hypertensive heart disease with heart failure: Secondary | ICD-10-CM | POA: Diagnosis not present

## 2021-08-04 DIAGNOSIS — I25700 Atherosclerosis of coronary artery bypass graft(s), unspecified, with unstable angina pectoris: Secondary | ICD-10-CM | POA: Diagnosis present

## 2021-08-04 DIAGNOSIS — I509 Heart failure, unspecified: Secondary | ICD-10-CM

## 2021-08-04 DIAGNOSIS — I517 Cardiomegaly: Secondary | ICD-10-CM | POA: Diagnosis not present

## 2021-08-04 DIAGNOSIS — I200 Unstable angina: Secondary | ICD-10-CM | POA: Diagnosis not present

## 2021-08-04 DIAGNOSIS — I739 Peripheral vascular disease, unspecified: Secondary | ICD-10-CM | POA: Diagnosis present

## 2021-08-04 DIAGNOSIS — J189 Pneumonia, unspecified organism: Secondary | ICD-10-CM | POA: Diagnosis not present

## 2021-08-04 DIAGNOSIS — G894 Chronic pain syndrome: Secondary | ICD-10-CM | POA: Diagnosis not present

## 2021-08-04 DIAGNOSIS — Z6839 Body mass index (BMI) 39.0-39.9, adult: Secondary | ICD-10-CM

## 2021-08-04 DIAGNOSIS — Z955 Presence of coronary angioplasty implant and graft: Secondary | ICD-10-CM

## 2021-08-04 DIAGNOSIS — J9621 Acute and chronic respiratory failure with hypoxia: Secondary | ICD-10-CM | POA: Diagnosis not present

## 2021-08-04 DIAGNOSIS — R7989 Other specified abnormal findings of blood chemistry: Secondary | ICD-10-CM

## 2021-08-04 DIAGNOSIS — I70203 Unspecified atherosclerosis of native arteries of extremities, bilateral legs: Secondary | ICD-10-CM | POA: Diagnosis present

## 2021-08-04 DIAGNOSIS — Z794 Long term (current) use of insulin: Secondary | ICD-10-CM | POA: Diagnosis not present

## 2021-08-04 DIAGNOSIS — E669 Obesity, unspecified: Secondary | ICD-10-CM | POA: Diagnosis not present

## 2021-08-04 DIAGNOSIS — I5042 Chronic combined systolic (congestive) and diastolic (congestive) heart failure: Secondary | ICD-10-CM | POA: Diagnosis present

## 2021-08-04 DIAGNOSIS — I70229 Atherosclerosis of native arteries of extremities with rest pain, unspecified extremity: Secondary | ICD-10-CM | POA: Diagnosis not present

## 2021-08-04 DIAGNOSIS — F419 Anxiety disorder, unspecified: Secondary | ICD-10-CM | POA: Diagnosis not present

## 2021-08-04 DIAGNOSIS — I25110 Atherosclerotic heart disease of native coronary artery with unstable angina pectoris: Secondary | ICD-10-CM | POA: Diagnosis not present

## 2021-08-04 DIAGNOSIS — K219 Gastro-esophageal reflux disease without esophagitis: Secondary | ICD-10-CM | POA: Diagnosis present

## 2021-08-04 DIAGNOSIS — I209 Angina pectoris, unspecified: Secondary | ICD-10-CM | POA: Diagnosis not present

## 2021-08-04 DIAGNOSIS — I4892 Unspecified atrial flutter: Secondary | ICD-10-CM | POA: Diagnosis present

## 2021-08-04 DIAGNOSIS — Z01810 Encounter for preprocedural cardiovascular examination: Secondary | ICD-10-CM | POA: Diagnosis not present

## 2021-08-04 DIAGNOSIS — I480 Paroxysmal atrial fibrillation: Secondary | ICD-10-CM | POA: Diagnosis present

## 2021-08-04 DIAGNOSIS — I959 Hypotension, unspecified: Secondary | ICD-10-CM | POA: Diagnosis not present

## 2021-08-04 DIAGNOSIS — Z885 Allergy status to narcotic agent status: Secondary | ICD-10-CM

## 2021-08-04 DIAGNOSIS — Z7984 Long term (current) use of oral hypoglycemic drugs: Secondary | ICD-10-CM

## 2021-08-04 DIAGNOSIS — I152 Hypertension secondary to endocrine disorders: Secondary | ICD-10-CM | POA: Diagnosis present

## 2021-08-04 DIAGNOSIS — I10 Essential (primary) hypertension: Secondary | ICD-10-CM

## 2021-08-04 DIAGNOSIS — G8929 Other chronic pain: Secondary | ICD-10-CM | POA: Diagnosis not present

## 2021-08-04 DIAGNOSIS — H538 Other visual disturbances: Secondary | ICD-10-CM | POA: Diagnosis present

## 2021-08-04 DIAGNOSIS — L89892 Pressure ulcer of other site, stage 2: Secondary | ICD-10-CM | POA: Diagnosis present

## 2021-08-04 DIAGNOSIS — I70223 Atherosclerosis of native arteries of extremities with rest pain, bilateral legs: Secondary | ICD-10-CM | POA: Diagnosis not present

## 2021-08-04 DIAGNOSIS — E871 Hypo-osmolality and hyponatremia: Secondary | ICD-10-CM | POA: Diagnosis not present

## 2021-08-04 DIAGNOSIS — Z66 Do not resuscitate: Secondary | ICD-10-CM | POA: Diagnosis not present

## 2021-08-04 DIAGNOSIS — I447 Left bundle-branch block, unspecified: Secondary | ICD-10-CM | POA: Diagnosis present

## 2021-08-04 DIAGNOSIS — K5900 Constipation, unspecified: Secondary | ICD-10-CM | POA: Diagnosis not present

## 2021-08-04 DIAGNOSIS — I5032 Chronic diastolic (congestive) heart failure: Secondary | ICD-10-CM | POA: Diagnosis present

## 2021-08-04 DIAGNOSIS — I252 Old myocardial infarction: Secondary | ICD-10-CM

## 2021-08-04 DIAGNOSIS — Z7901 Long term (current) use of anticoagulants: Secondary | ICD-10-CM | POA: Diagnosis not present

## 2021-08-04 DIAGNOSIS — Z7902 Long term (current) use of antithrombotics/antiplatelets: Secondary | ICD-10-CM

## 2021-08-04 DIAGNOSIS — E1165 Type 2 diabetes mellitus with hyperglycemia: Secondary | ICD-10-CM | POA: Diagnosis not present

## 2021-08-04 DIAGNOSIS — Z9861 Coronary angioplasty status: Secondary | ICD-10-CM

## 2021-08-04 DIAGNOSIS — K649 Unspecified hemorrhoids: Secondary | ICD-10-CM | POA: Diagnosis present

## 2021-08-04 DIAGNOSIS — Z87442 Personal history of urinary calculi: Secondary | ICD-10-CM

## 2021-08-04 DIAGNOSIS — R531 Weakness: Secondary | ICD-10-CM | POA: Diagnosis present

## 2021-08-04 DIAGNOSIS — R9431 Abnormal electrocardiogram [ECG] [EKG]: Secondary | ICD-10-CM | POA: Diagnosis not present

## 2021-08-04 DIAGNOSIS — R809 Proteinuria, unspecified: Secondary | ICD-10-CM | POA: Diagnosis present

## 2021-08-04 DIAGNOSIS — I472 Ventricular tachycardia, unspecified: Secondary | ICD-10-CM | POA: Diagnosis not present

## 2021-08-04 DIAGNOSIS — I69398 Other sequelae of cerebral infarction: Secondary | ICD-10-CM

## 2021-08-04 DIAGNOSIS — Z882 Allergy status to sulfonamides status: Secondary | ICD-10-CM

## 2021-08-04 DIAGNOSIS — E1142 Type 2 diabetes mellitus with diabetic polyneuropathy: Secondary | ICD-10-CM | POA: Diagnosis not present

## 2021-08-04 DIAGNOSIS — R682 Dry mouth, unspecified: Secondary | ICD-10-CM | POA: Diagnosis not present

## 2021-08-04 DIAGNOSIS — I2699 Other pulmonary embolism without acute cor pulmonale: Secondary | ICD-10-CM | POA: Diagnosis not present

## 2021-08-04 DIAGNOSIS — R0902 Hypoxemia: Secondary | ICD-10-CM | POA: Diagnosis not present

## 2021-08-04 DIAGNOSIS — Z5181 Encounter for therapeutic drug level monitoring: Secondary | ICD-10-CM | POA: Diagnosis not present

## 2021-08-04 DIAGNOSIS — Z940 Kidney transplant status: Secondary | ICD-10-CM

## 2021-08-04 DIAGNOSIS — F17210 Nicotine dependence, cigarettes, uncomplicated: Secondary | ICD-10-CM | POA: Diagnosis present

## 2021-08-04 DIAGNOSIS — R0602 Shortness of breath: Secondary | ICD-10-CM

## 2021-08-04 DIAGNOSIS — I483 Typical atrial flutter: Secondary | ICD-10-CM | POA: Diagnosis not present

## 2021-08-04 DIAGNOSIS — M79604 Pain in right leg: Secondary | ICD-10-CM | POA: Diagnosis not present

## 2021-08-04 DIAGNOSIS — J440 Chronic obstructive pulmonary disease with acute lower respiratory infection: Secondary | ICD-10-CM | POA: Diagnosis not present

## 2021-08-04 DIAGNOSIS — E1151 Type 2 diabetes mellitus with diabetic peripheral angiopathy without gangrene: Secondary | ICD-10-CM | POA: Diagnosis not present

## 2021-08-04 DIAGNOSIS — Z951 Presence of aortocoronary bypass graft: Secondary | ICD-10-CM | POA: Diagnosis not present

## 2021-08-04 DIAGNOSIS — R309 Painful micturition, unspecified: Secondary | ICD-10-CM | POA: Diagnosis present

## 2021-08-04 DIAGNOSIS — Z9049 Acquired absence of other specified parts of digestive tract: Secondary | ICD-10-CM | POA: Diagnosis not present

## 2021-08-04 DIAGNOSIS — M79605 Pain in left leg: Secondary | ICD-10-CM | POA: Diagnosis not present

## 2021-08-04 DIAGNOSIS — J9622 Acute and chronic respiratory failure with hypercapnia: Secondary | ICD-10-CM | POA: Diagnosis not present

## 2021-08-04 DIAGNOSIS — N3090 Cystitis, unspecified without hematuria: Secondary | ICD-10-CM | POA: Diagnosis present

## 2021-08-04 DIAGNOSIS — N186 End stage renal disease: Secondary | ICD-10-CM | POA: Diagnosis not present

## 2021-08-04 DIAGNOSIS — I255 Ischemic cardiomyopathy: Secondary | ICD-10-CM | POA: Diagnosis present

## 2021-08-04 DIAGNOSIS — I2510 Atherosclerotic heart disease of native coronary artery without angina pectoris: Secondary | ICD-10-CM | POA: Diagnosis not present

## 2021-08-04 DIAGNOSIS — Z888 Allergy status to other drugs, medicaments and biological substances status: Secondary | ICD-10-CM

## 2021-08-04 DIAGNOSIS — E1140 Type 2 diabetes mellitus with diabetic neuropathy, unspecified: Secondary | ICD-10-CM | POA: Diagnosis not present

## 2021-08-04 DIAGNOSIS — Z7952 Long term (current) use of systemic steroids: Secondary | ICD-10-CM

## 2021-08-04 DIAGNOSIS — R0789 Other chest pain: Secondary | ICD-10-CM

## 2021-08-04 DIAGNOSIS — R5381 Other malaise: Secondary | ICD-10-CM | POA: Diagnosis not present

## 2021-08-04 DIAGNOSIS — Z881 Allergy status to other antibiotic agents status: Secondary | ICD-10-CM

## 2021-08-04 DIAGNOSIS — J810 Acute pulmonary edema: Secondary | ICD-10-CM | POA: Diagnosis not present

## 2021-08-04 DIAGNOSIS — Z800 Family history of malignant neoplasm of digestive organs: Secondary | ICD-10-CM

## 2021-08-04 DIAGNOSIS — R1110 Vomiting, unspecified: Secondary | ICD-10-CM | POA: Diagnosis present

## 2021-08-04 DIAGNOSIS — J449 Chronic obstructive pulmonary disease, unspecified: Secondary | ICD-10-CM | POA: Diagnosis not present

## 2021-08-04 LAB — URINALYSIS, ROUTINE W REFLEX MICROSCOPIC
Bacteria, UA: NONE SEEN
Bilirubin Urine: NEGATIVE
Glucose, UA: NEGATIVE mg/dL
Hgb urine dipstick: NEGATIVE
Ketones, ur: NEGATIVE mg/dL
Nitrite: NEGATIVE
Protein, ur: 100 mg/dL — AB
Specific Gravity, Urine: 1.016 (ref 1.005–1.030)
pH: 6 (ref 5.0–8.0)

## 2021-08-04 LAB — GLUCOSE, CAPILLARY
Glucose-Capillary: 258 mg/dL — ABNORMAL HIGH (ref 70–99)
Glucose-Capillary: 245 mg/dL — ABNORMAL HIGH (ref 70–99)
Glucose-Capillary: 247 mg/dL — ABNORMAL HIGH (ref 70–99)

## 2021-08-04 LAB — COMPREHENSIVE METABOLIC PANEL
ALT: 12 U/L (ref 0–44)
AST: 37 U/L (ref 15–41)
Albumin: 3.4 g/dL — ABNORMAL LOW (ref 3.5–5.0)
Alkaline Phosphatase: 87 U/L (ref 38–126)
Anion gap: 10 (ref 5–15)
BUN: 19 mg/dL (ref 8–23)
CO2: 23 mmol/L (ref 22–32)
Calcium: 8.5 mg/dL — ABNORMAL LOW (ref 8.9–10.3)
Chloride: 99 mmol/L (ref 98–111)
Creatinine, Ser: 0.99 mg/dL (ref 0.44–1.00)
GFR, Estimated: >60 mL/min (ref >60)
Glucose, Bld: 196 mg/dL — ABNORMAL HIGH (ref 70–99)
Potassium: 4.2 mmol/L (ref 3.5–5.1)
Sodium: 132 mmol/L — ABNORMAL LOW (ref 135–145)
Total Bilirubin: 0.8 mg/dL (ref 0.3–1.2)
Total Protein: 7.3 g/dL (ref 6.5–8.1)

## 2021-08-04 LAB — CBC WITH DIFFERENTIAL/PLATELET
Abs Immature Granulocytes: 0.03 10*3/uL (ref 0.00–0.07)
Basophils Absolute: 0 10*3/uL (ref 0.0–0.1)
Basophils Relative: 0 %
Eosinophils Absolute: 0 10*3/uL (ref 0.0–0.5)
Eosinophils Relative: 1 %
HCT: 37.7 % (ref 36.0–46.0)
Hemoglobin: 12 g/dL (ref 12.0–15.0)
Immature Granulocytes: 1 %
Lymphocytes Relative: 16 %
Lymphs Abs: 0.8 10*3/uL (ref 0.7–4.0)
MCH: 31.8 pg (ref 26.0–34.0)
MCHC: 31.8 g/dL (ref 30.0–36.0)
MCV: 100 fL (ref 80.0–100.0)
Monocytes Absolute: 0.5 10*3/uL (ref 0.1–1.0)
Monocytes Relative: 9 %
Neutro Abs: 4 10*3/uL (ref 1.7–7.7)
Neutrophils Relative %: 73 %
Platelets: 197 10*3/uL (ref 150–400)
RBC: 3.77 MIL/uL — ABNORMAL LOW (ref 3.87–5.11)
RDW: 16.1 % — ABNORMAL HIGH (ref 11.5–15.5)
WBC: 5.4 10*3/uL (ref 4.0–10.5)
nRBC: 0 % (ref 0.0–0.2)

## 2021-08-04 LAB — BLOOD GAS, VENOUS
Acid-Base Excess: 1.8 mmol/L (ref 0.0–2.0)
Bicarbonate: 27.8 mmol/L (ref 20.0–28.0)
O2 Saturation: 14.9 %
Patient temperature: 98.6
pCO2, Ven: 52.4 mmHg (ref 44.0–60.0)
pH, Ven: 7.344 (ref 7.250–7.430)
pO2, Ven: <31 mmHg — CL (ref 32.0–45.0)

## 2021-08-04 LAB — CBG MONITORING, ED
Glucose-Capillary: 162 mg/dL — ABNORMAL HIGH (ref 70–99)
Glucose-Capillary: 253 mg/dL — ABNORMAL HIGH (ref 70–99)
Glucose-Capillary: 334 mg/dL — ABNORMAL HIGH (ref 70–99)

## 2021-08-04 LAB — C-REACTIVE PROTEIN: CRP: 14.5 mg/dL — ABNORMAL HIGH (ref <1.0)

## 2021-08-04 LAB — FERRITIN: Ferritin: 805 ng/mL — ABNORMAL HIGH (ref 11–307)

## 2021-08-04 LAB — PHOSPHORUS: Phosphorus: 2.7 mg/dL (ref 2.5–4.6)

## 2021-08-04 LAB — TROPONIN I (HIGH SENSITIVITY)
Troponin I (High Sensitivity): 43 ng/L — ABNORMAL HIGH (ref <18)
Troponin I (High Sensitivity): 49 ng/L — ABNORMAL HIGH (ref <18)

## 2021-08-04 LAB — RESP PANEL BY RT-PCR (FLU A&B, COVID) ARPGX2
Influenza A by PCR: NEGATIVE
Influenza B by PCR: NEGATIVE
SARS Coronavirus 2 by RT PCR: POSITIVE — AB

## 2021-08-04 LAB — BRAIN NATRIURETIC PEPTIDE: B Natriuretic Peptide: 189.6 pg/mL — ABNORMAL HIGH (ref 0.0–100.0)

## 2021-08-04 LAB — MAGNESIUM: Magnesium: 1.5 mg/dL — ABNORMAL LOW (ref 1.7–2.4)

## 2021-08-04 LAB — LACTIC ACID, PLASMA: Lactic Acid, Venous: 2.1 mmol/L (ref 0.5–1.9)

## 2021-08-04 LAB — D-DIMER, QUANTITATIVE: D-Dimer, Quant: 1.36 ug/mL-FEU — ABNORMAL HIGH (ref 0.00–0.50)

## 2021-08-04 LAB — PROCALCITONIN: Procalcitonin: 0.21 ng/mL

## 2021-08-04 MED ORDER — ISOSORBIDE MONONITRATE ER 60 MG PO TB24
60.0000 mg | ORAL_TABLET | Freq: Every day | ORAL | Status: DC
  Administered 2021-08-05 – 2021-08-11 (×7): 60 mg via ORAL
  Filled 2021-08-04 (×8): qty 1

## 2021-08-04 MED ORDER — OXYCODONE HCL 5 MG PO TABS
10.0000 mg | ORAL_TABLET | Freq: Four times a day (QID) | ORAL | Status: DC | PRN
  Administered 2021-08-04 – 2021-08-25 (×42): 10 mg via ORAL
  Filled 2021-08-04 (×42): qty 2

## 2021-08-04 MED ORDER — ZINC SULFATE 220 (50 ZN) MG PO CAPS
220.0000 mg | ORAL_CAPSULE | Freq: Every day | ORAL | Status: DC
  Administered 2021-08-04 – 2021-08-25 (×22): 220 mg via ORAL
  Filled 2021-08-04 (×22): qty 1

## 2021-08-04 MED ORDER — CLOPIDOGREL BISULFATE 75 MG PO TABS
75.0000 mg | ORAL_TABLET | Freq: Every day | ORAL | Status: DC
  Administered 2021-08-04 – 2021-08-25 (×22): 75 mg via ORAL
  Filled 2021-08-04 (×22): qty 1

## 2021-08-04 MED ORDER — APIXABAN 5 MG PO TABS
5.0000 mg | ORAL_TABLET | Freq: Two times a day (BID) | ORAL | Status: DC
  Administered 2021-08-04 – 2021-08-09 (×11): 5 mg via ORAL
  Filled 2021-08-04 (×12): qty 1

## 2021-08-04 MED ORDER — GLIMEPIRIDE 4 MG PO TABS
4.0000 mg | ORAL_TABLET | Freq: Every day | ORAL | Status: DC
  Administered 2021-08-04: 12:00:00 4 mg via ORAL
  Filled 2021-08-04 (×2): qty 1

## 2021-08-04 MED ORDER — CYCLOBENZAPRINE HCL 10 MG PO TABS
10.0000 mg | ORAL_TABLET | Freq: Three times a day (TID) | ORAL | Status: DC | PRN
  Administered 2021-08-08 – 2021-08-16 (×6): 10 mg via ORAL
  Filled 2021-08-04 (×7): qty 1

## 2021-08-04 MED ORDER — ARFORMOTEROL TARTRATE 15 MCG/2ML IN NEBU
15.0000 ug | INHALATION_SOLUTION | Freq: Two times a day (BID) | RESPIRATORY_TRACT | Status: DC
  Administered 2021-08-04 – 2021-08-25 (×42): 15 ug via RESPIRATORY_TRACT
  Filled 2021-08-04 (×44): qty 2

## 2021-08-04 MED ORDER — LORATADINE 10 MG PO TABS
10.0000 mg | ORAL_TABLET | Freq: Every day | ORAL | Status: DC
  Administered 2021-08-04 – 2021-08-25 (×22): 10 mg via ORAL
  Filled 2021-08-04 (×22): qty 1

## 2021-08-04 MED ORDER — MAGNESIUM SULFATE 2 GM/50ML IV SOLN
2.0000 g | Freq: Once | INTRAVENOUS | Status: AC
  Administered 2021-08-04: 09:00:00 2 g via INTRAVENOUS
  Filled 2021-08-04: qty 50

## 2021-08-04 MED ORDER — NITROGLYCERIN 0.4 MG SL SUBL
0.4000 mg | SUBLINGUAL_TABLET | SUBLINGUAL | Status: DC | PRN
  Administered 2021-08-09 – 2021-08-19 (×7): 0.4 mg via SUBLINGUAL
  Filled 2021-08-04 (×6): qty 1

## 2021-08-04 MED ORDER — LAMOTRIGINE 25 MG PO TABS
25.0000 mg | ORAL_TABLET | Freq: Every day | ORAL | Status: DC
  Administered 2021-08-04 – 2021-08-24 (×21): 25 mg via ORAL
  Filled 2021-08-04 (×21): qty 1

## 2021-08-04 MED ORDER — BASAGLAR KWIKPEN 100 UNIT/ML ~~LOC~~ SOPN: 12.0000 [IU] | PEN_INJECTOR | Freq: Every day | SUBCUTANEOUS | Status: DC

## 2021-08-04 MED ORDER — SERTRALINE HCL 100 MG PO TABS
100.0000 mg | ORAL_TABLET | Freq: Every day | ORAL | Status: DC
  Administered 2021-08-04 – 2021-08-25 (×22): 100 mg via ORAL
  Filled 2021-08-04 (×2): qty 1
  Filled 2021-08-04: qty 2
  Filled 2021-08-04 (×19): qty 1

## 2021-08-04 MED ORDER — SODIUM CHLORIDE 0.9 % IV SOLN: 100.0000 mg | Freq: Every day | INTRAVENOUS | Status: DC

## 2021-08-04 MED ORDER — LORAZEPAM 1 MG PO TABS
1.0000 mg | ORAL_TABLET | Freq: Two times a day (BID) | ORAL | Status: DC | PRN
  Administered 2021-08-04 – 2021-08-11 (×12): 1 mg via ORAL
  Filled 2021-08-04 (×14): qty 1

## 2021-08-04 MED ORDER — ORAL CARE MOUTH RINSE
15.0000 mL | Freq: Two times a day (BID) | OROMUCOSAL | Status: DC
  Administered 2021-08-04 – 2021-08-25 (×37): 15 mL via OROMUCOSAL

## 2021-08-04 MED ORDER — MONTELUKAST SODIUM 10 MG PO TABS
10.0000 mg | ORAL_TABLET | Freq: Every day | ORAL | Status: DC
  Administered 2021-08-04 – 2021-08-24 (×21): 10 mg via ORAL
  Filled 2021-08-04 (×21): qty 1

## 2021-08-04 MED ORDER — METOPROLOL TARTRATE 25 MG PO TABS
100.0000 mg | ORAL_TABLET | Freq: Two times a day (BID) | ORAL | Status: DC
  Administered 2021-08-04 – 2021-08-09 (×12): 100 mg via ORAL
  Filled 2021-08-04 (×13): qty 4

## 2021-08-04 MED ORDER — IPRATROPIUM-ALBUTEROL 0.5-2.5 (3) MG/3ML IN SOLN
3.0000 mL | Freq: Once | RESPIRATORY_TRACT | Status: AC
  Administered 2021-08-04: 04:00:00 3 mL via RESPIRATORY_TRACT
  Filled 2021-08-04: qty 3

## 2021-08-04 MED ORDER — PANTOPRAZOLE SODIUM 40 MG PO TBEC
40.0000 mg | DELAYED_RELEASE_TABLET | Freq: Every day | ORAL | Status: DC
  Administered 2021-08-04 – 2021-08-25 (×22): 40 mg via ORAL
  Filled 2021-08-04 (×22): qty 1

## 2021-08-04 MED ORDER — ACETAMINOPHEN 650 MG RE SUPP: 650.0000 mg | Freq: Four times a day (QID) | RECTAL | Status: DC | PRN

## 2021-08-04 MED ORDER — LINAGLIPTIN 5 MG PO TABS
5.0000 mg | ORAL_TABLET | Freq: Every day | ORAL | Status: DC
  Administered 2021-08-04 – 2021-08-25 (×22): 5 mg via ORAL
  Filled 2021-08-04 (×22): qty 1

## 2021-08-04 MED ORDER — INSULIN GLARGINE-YFGN 100 UNIT/ML ~~LOC~~ SOLN
12.0000 [IU] | Freq: Every day | SUBCUTANEOUS | Status: DC
Start: 2021-08-04 — End: 2021-08-06
  Administered 2021-08-04 – 2021-08-05 (×2): 12 [IU] via SUBCUTANEOUS
  Filled 2021-08-04 (×3): qty 0.12

## 2021-08-04 MED ORDER — ROSUVASTATIN CALCIUM 20 MG PO TABS
20.0000 mg | ORAL_TABLET | Freq: Every day | ORAL | Status: DC
  Administered 2021-08-04 – 2021-08-25 (×22): 20 mg via ORAL
  Filled 2021-08-04 (×22): qty 1

## 2021-08-04 MED ORDER — METHYLPREDNISOLONE SODIUM SUCC 125 MG IJ SOLR
125.0000 mg | Freq: Once | INTRAMUSCULAR | Status: AC
  Administered 2021-08-04: 06:00:00 125 mg via INTRAVENOUS
  Filled 2021-08-04: qty 2

## 2021-08-04 MED ORDER — CHLORHEXIDINE GLUCONATE CLOTH 2 % EX PADS
6.0000 | MEDICATED_PAD | Freq: Every day | CUTANEOUS | Status: DC
  Administered 2021-08-04 – 2021-08-25 (×22): 6 via TOPICAL

## 2021-08-04 MED ORDER — DEXAMETHASONE SODIUM PHOSPHATE 10 MG/ML IJ SOLN
6.0000 mg | INTRAMUSCULAR | Status: DC
  Administered 2021-08-05: 06:00:00 6 mg via INTRAVENOUS
  Filled 2021-08-04: qty 1

## 2021-08-04 MED ORDER — INSULIN ASPART 100 UNIT/ML IJ SOLN
0.0000 [IU] | INTRAMUSCULAR | Status: DC
  Administered 2021-08-04 (×2): 8 [IU] via SUBCUTANEOUS
  Administered 2021-08-04: 21:00:00 5 [IU] via SUBCUTANEOUS
  Administered 2021-08-04: 13:00:00 11 [IU] via SUBCUTANEOUS
  Administered 2021-08-05 (×2): 3 [IU] via SUBCUTANEOUS
  Administered 2021-08-05: 5 [IU] via SUBCUTANEOUS
  Filled 2021-08-04: qty 0.15

## 2021-08-04 MED ORDER — SODIUM CHLORIDE 0.9 % IV SOLN
200.0000 mg | Freq: Once | INTRAVENOUS | Status: DC
  Filled 2021-08-04: qty 40

## 2021-08-04 MED ORDER — ACETAMINOPHEN 325 MG PO TABS
650.0000 mg | ORAL_TABLET | Freq: Four times a day (QID) | ORAL | Status: DC | PRN
  Administered 2021-08-05 – 2021-08-25 (×8): 650 mg via ORAL
  Filled 2021-08-04 (×9): qty 2

## 2021-08-04 MED ORDER — INSULIN DETEMIR 100 UNIT/ML ~~LOC~~ SOLN: 0.1500 [IU]/kg | Freq: Two times a day (BID) | SUBCUTANEOUS | Status: DC

## 2021-08-04 MED ORDER — GUAIFENESIN-DM 100-10 MG/5ML PO SYRP
10.0000 mL | ORAL_SOLUTION | ORAL | Status: DC | PRN
  Administered 2021-08-04 – 2021-08-06 (×3): 10 mL via ORAL
  Filled 2021-08-04 (×3): qty 10

## 2021-08-04 MED ORDER — CALCITRIOL 0.25 MCG PO CAPS: 0.2500 ug | ORAL_CAPSULE | ORAL | Status: DC

## 2021-08-04 MED ORDER — CALCITRIOL 0.25 MCG PO CAPS
0.2500 ug | ORAL_CAPSULE | ORAL | Status: DC
  Administered 2021-08-05 – 2021-08-23 (×7): 0.25 ug via ORAL
  Filled 2021-08-04 (×7): qty 1

## 2021-08-04 MED ORDER — ASCORBIC ACID 500 MG PO TABS
500.0000 mg | ORAL_TABLET | Freq: Every day | ORAL | Status: DC
  Administered 2021-08-04 – 2021-08-25 (×22): 500 mg via ORAL
  Filled 2021-08-04 (×22): qty 1

## 2021-08-04 NOTE — ED Provider Notes (Signed)
[facility] Emergency Department Provider Note MRN:  [id number]  Arrival date & time: 08/04/21     Chief Complaint   generalized weakness   History of Present Illness   Katherine [name] is a 65 y.o. year-old female with a history of CAD, COPD presenting to the ED with chief complaint of weakness.  Endorsing weakness and burning with urination for the past week.  Denies shortness of breath.  Family members at home with [place].  Patient seems confused.  Does not always answer questions.  I was unable to obtain an accurate HPI, PMH, or ROS due to the patient's altered mental status.  Level 5 caveat. Review of Systems  Positive for altered mental status, dysuria, weakness.  Patient's Health History    Past Medical History:  Diagnosis Date   Anemia    Anxiety    Bilateral carotid artery stenosis    Carotid duplex 03/5026: 7-41% LICA, 28-78% RICA, >67% RECA, f/u 1 yr suggested   CAD (coronary artery disease) of bypass graft 5/01; 3/'02, 8/'03, 10/'04; 1/15   PCI x 5 to SVG-D1    CAD in native artery 07/1993   3 Vessel Disease (LAD-D1 & RCA) -- CABG (Dx in setting of inferior STEMI-PTCA of RCA)   CAD S/P percutaneous coronary angioplasty    PCI to SVG-D1 insertion/native D1 x 4 = '01 -(S660 BMS 2.5 x 9 anastomosis- D1); '02 - distal overlap ACS Pixel 2.5 x 8  BMS; '03 distal/native ISR/Thrombosis - Pixel 2.5 x 13; '04 - ISR-  Taxus 2.5 x 20 (covered all);; 1/15 - mid SVG-D1 (50% distal ISR) - Promus P 2.75 x 20 -- 2.8 mm; 6/22: Extensive ISR PTCA & Anastomotic-Native Diag DES PCI (Frontier Onyx 2.25x12 (2.75-2.5 mm post-dilation   COPD mixed type ([name])    Followed by Dr. Lamonte Sakai "pulmonologist said no COPD"   Depression with anxiety    Diabetes mellitus type 2 in obese ([place])    Diarrhea    started after cholecystectomy and mass removed from intestine   Dyslipidemia, goal LDL below 70    08/2012: TC 137, TG 200, HDL 32!, LDL 45; on statin (followed by  Dr.Deterding)   ESRD (end stage renal disease) ([place]) 1991   s/p Cadaveric Renal Transplant ([facility] - Dr. Jimmy Footman)    Family history of adverse reaction to anesthesia    mom's bp dropped during/after anesthesia   Fibromyalgia    GERD (gastroesophageal reflux disease)    Glomerulonephritis, chronic, rapidly progressive 1989   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 07/1993   Rescue PTCA of RCA -- referred for CABG.   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  Had having having lots of nausea and vomiting with poor p.o. intake.  Thought to have QT prolongation with multiple medications involved and hypomagnesemia, hypokalemia.  Tikosyn discontinued along with Zoloft and Phenergan.   Hypertension associated with diabetes ([place])    Mild aortic stenosis by prior echocardiogram 07/2019   Echo:  Mild aortic stenosis (gradients: Mean 14.3 mmHg -peak 24.9 mmHg).   Morbid obesity ([facility])    MRSA (methicillin resistant staph aureus) culture positive  OSA (obstructive sleep apnea)    no longer on CPAP or home O2, states she doesn't need now after lap band   PAD (peripheral artery disease) ([place]) 08/2013   LEA Dopplers to be read by Dr. Fletcher Anon   PAF (paroxysmal atrial fibrillation) ([place]) 06/2014   Noted on CardioNet Monitor  - --> rhythm control with Tikosyn (Dr. Rayann Heman); converted from warfarin to apixaban for anticoagulation.   Pneumonia    Recurrent boils    Bilateral Groin   Rheumatoid arthritis ([place])    Per Patient Report; associated with OA   S/p cadaver renal transplant 1991   [facility]   Unstable angina ([place]) 5/01; 3/'02, 8/'03, 10/'04; 1/15   x 5 occurences since Inf-Post STEMI in 1994    Past  Surgical History:  Procedure Laterality Date   ABDOMINAL AORTOGRAM N/A 04/21/2018   Procedure: ABDOMINAL AORTOGRAM;  Surgeon: [name], MD;  Location: [place] [facility];  Service: Cardiovascular;  Laterality: N/A;   CATHETER REMOVAL     CHOLECYSTECTOMY N/A 10/29/2014   Procedure: LAPAROSCOPIC CHOLECYSTECTOMY WITH INTRAOPERATIVE CHOLANGIOGRAM;  Surgeon: Excell Seltzer, MD;  Location: [facility];  Service: General;  Laterality: N/A;   CORONARY ANGIOPLASTY  1994   x5   CORONARY ARTERY BYPASS GRAFT  1995   LIMA-LAD, SVG-RPDA, SVG-D1   CORONARY STENT INTERVENTION N/A 02/20/2021   Procedure: PERCUTANEOUS CORONARY STENT INTERVENTION;  Surgeon: [name], MD;  Location: [place] [facility]; ostLCx 60% (Neg RFR 0.96);; SVG- D2 recurrent 90% ISR & 95% native D2 after graft-> DES PCI of 95% anastomotic D2 lesion (Onyx Frontier 2.25 mm x 12 mm => 2.75 mm @ overlap, 2.5 distal.);PTCA of ISR in body of graft. ->  2.5 mm scoring balloon & post-dil w/ 2.75 mm [place] balloon   ESOPHAGOGASTRODUODENOSCOPY N/A 10/15/2016   Procedure: ESOPHAGOGASTRODUODENOSCOPY (EGD);  Surgeon: Wilford Corner, MD;  Location: [facility];  Service: Endoscopy;  Laterality: N/A;   I & D EXTREMITY Right 01/29/2018   Procedure: IRRIGATION AND DEBRIDEMENT THUMB;  Surgeon: Dayna Barker, MD;  Location: [place];  Service: Plastics;  Laterality: Right;   INCISE AND DRAIN ABCESS     INTRAVASCULAR PRESSURE WIRE/FFR STUDY N/A 02/20/2021   Procedure: INTRAVASCULAR PRESSURE WIRE/FFR STUDY;  Surgeon: [name], MD;  Location: [place] [facility];  Service: Cardiovascular;  Laterality: N/A;   KIDNEY TRANSPLANT  1991   KNEE ARTHROSCOPY WITH LATERAL MENISECTOMY Left 12/03/2017   Procedure: LEFT KNEE ARTHROSCOPY WITH LATERAL MENISECTOMY;  Surgeon: Earlie Server, MD;  Location: [place];  Service: Orthopedics;  Laterality: Left;   LAPAROSCOPIC GASTRIC BANDING  04/2004; 10/'09, 2/'10   Port Replacement x 2   LEFT HEART CATH AND  CORONARY ANGIOGRAPHY N/A 02/20/2021   Procedure: LEFT HEART CATH AND CORONARY ANGIOGRAPHY;  Surgeon: [name], MD;  Location: [place] [facility];  Service: Cardiovascular;  Laterality: N/A;   LEFT HEART CATH AND CORS/GRAFTS ANGIOGRAPHY N/A 04/21/2018   Procedure: LEFT HEART CATH AND CORS/GRAFTS ANGIOGRAPHY;  Surgeon: [name], MD;  Location: [place] [facility];  Ost-Prox LAD 50% - proxLAD (pre & post D1) 100% CTO. Cx - patent, small OM1 (stable ~ ostial OM1 90%, too small for PCI) & 2 small LPL; Ost-distal RCA 100% CTO.  LIMA-LAD (not injected); SVG-dRCA patent, SVG-D1 - insertion stent ~20% ISR - Severe R CFA disease w/ focal Sub TO   LEFT HEART CATH AND CORS/GRAFTS ANGIOGRAPHY  5/'01, 3/'02, 8/'03, 10/'04; 1/'15   08/22/2013: LAD & RCA 100%;  LIMA-LAD & SVG-rPDA patent; Cx-- OM1 60%, OM2 ostial ~50%; SVG-D1 - 80% mid, 50% distal ISR --PCI   LEFT HEART CATH AND CORS/GRAFTS ANGIOGRAPHY N/A 01/31/2021   Procedure: LEFT HEART CATH AND CORS/GRAFTS ANGIOGRAPHY;  Surgeon: Jolaine Artist, MD;  Location: [facility];;   LEFT HEART CATHETERIZATION WITH CORONARY/GRAFT ANGIOGRAM N/A 08/23/2013   Procedure: LEFT HEART CATHETERIZATION WITH Beatrix Fetters;  Surgeon: Wellington Hampshire, MD;  Location: [place] [facility];  Service: Cardiovascular;  Laterality: N/A;   Lower Extremity Arterial Dopplers  08/2013   ABI: R 0.96, L 1.04   MULTIPLE TOOTH EXTRACTIONS  age 50   NM MYOVIEW LTD  03/2016   EF 62%. LOW RISK. C/W prior MI - no Ischemia. Apical hypokinesis.   PERCUTANEOUS CORONARY STENT INTERVENTION (PCI-S)  5/'01, 3/'02, 8/'03, 10/'04;   '01 - S660 BMS 2.5 x 9 - dSVG-D1 into D1; '02- post-stent stenosis - 2.5 x 8 Pixel BMS; '8\03: ISR/Thrombosis into native D1 - AngioJet, 2.5 x 13 Pixel; '04 - ISR 95% - covered stented area with Taxus DES 2.5 mm x 20 (2.88)   PERCUTANEOUS CORONARY STENT INTERVENTION (PCI-S)  08/23/2013   Procedure: PERCUTANEOUS CORONARY STENT INTERVENTION (PCI-S);   Surgeon: Wellington Hampshire, MD;  Location: [facility] CATH LAB;  Service: Cardiovascular;;mid SVG-D1 80%; distal stent ~50% ISR; Promus [place] DES 2.75 mm xc 20 mm (2.8 mm)   PORT-A-CATH REMOVAL     kidney   TRANSTHORACIC ECHOCARDIOGRAM  07/2019   a) 07/2019: EF 55 to 60%.  No LVH.  Paradoxical septal WM-s/p CABG.  GRII DD.  Nl RV size and fxn.  Mild bilateral atrial dilation.  Mod MAC.  Trace MR.  Mild AS (gradients: Mean 14.3 mmHg -peak 24.9 mmHg).; B) 06/2020: EF 40 to 45%.  Moderate concentric LVH.  GRII DD.  Elevated LAP.  Mod HK mid Apical Ant-AntSept wall & mild Apical Dyskinesis.  Mod LA dilation.  Mild MR.  AoV sclerosis w/o AS.   TRANSTHORACIC ECHOCARDIOGRAM  01/30/2021   EF 55 to 60%.  Mild LVH.  GR 1 DD.  Elevated LAP.  Moderate LA dilation.  Mild MR with mild MS.  Mild aortic valve stenosis.:   TUBAL LIGATION     wrist fistula repair Left    dialysis for one year    Family History  Problem Relation Age of Onset   Cancer Mother        liver   Heart disease Father    Cancer Father        colon   Arrhythmia Brother        Atrial Fibrillation   Arrhythmia Paternal Aunt        Atrial Fibrillation    Social History   Socioeconomic History   Marital status: Widowed    Spouse name: Not on file   Number of children: Not on file   Years of education: Not on file   Highest education level: Not on file  Occupational History   Not on file  Tobacco Use   Smoking status: Every Day    Packs/day: 1.00    Years: 30.00    Pack years: 30.00    Types: Cigarettes    Last attempt to quit: 08/17/2002    Years since quitting: 18.9   Smokeless tobacco: Never  Vaping Use   Vaping Use: Never used  Substance and Sexual Activity   Alcohol use: No   Drug use: No   Sexual activity: Not on file  Other Topics  Concern   Not on file  Social History Narrative   ** Merged History Encounter ** She is currently married, and the caregiver of her husband who is recovering from surgery for tongue cancer  now diagnosed with lung cancer. Prior to his diagnosis of her husband, she actually had adopted a 85-year-old child who she knows caring for as well. With all the surgeries, they have been quite financially troubled. Thanks the help of her community and church, they have been able to stay "alfoat."     She is a former smoker who quit in 2004 after a 30-pack-year history.   She is active chasing a 59-year-old child, does not do routine exercise. She's been quite depressed with the condition of her husband, and admits to eating comfort herself.   She does not drink alcohol.      05/13/2021 Patient reports that her husband is deceased. She is grieving the loss of her husband and the decline in her physical independence.    Social Determinants of Health   Financial Resource Strain: Not on file  Food Insecurity: No Food Insecurity   Worried About Charity fundraiser in the Last Year: Never true   Ran Out of Food in the Last Year: Never true  Transportation Needs: No Transportation Needs   Lack of Transportation (Medical): No   Lack of Transportation (Non-Medical): No  Physical Activity: Not on file  Stress: Not on file  Social Connections: Not on file  Intimate Partner Violence: Not on file     Physical Exam   Vitals:   08/04/21 0417 08/04/21 0545  BP:  (!) 137/58  Pulse:  82  Resp:  16  Temp:    SpO2: 95% 91%    CONSTITUTIONAL: Ill-appearing, tachypneic NEURO:  Alert and oriented x 3, no focal deficits EYES:  eyes equal and reactive ENT/NECK:  no LAD, no JVD CARDIO: Regular rate, well-perfused, normal S1 and S2 PULM: Scattered wheezes GI/GU:  normal bowel sounds, non-distended, non-tender MSK/SPINE:  No gross deformities, no edema SKIN:  no rash, atraumatic PSYCH:  Appropriate speech and behavior  *Additional and/or pertinent findings included in MDM below  Diagnostic and Interventional Summary    EKG Interpretation  Date/Time:  Monday August 04 2021 03:32:49  EST Ventricular Rate:  85 PR Interval:  67 QRS Duration: 141 QT Interval:  407 QTC Calculation: 484 R Axis:   -42 Text Interpretation: Sinus rhythm Short PR interval Left bundle branch block Confirmed by Gerlene Fee [phone]) on 08/04/2021 3:58:13 AM       Labs Reviewed  RESP PANEL BY RT-PCR (FLU A&B, COVID) ARPGX2 - Abnormal; Notable for the following components:      Result Value   SARS Coronavirus 2 by RT PCR POSITIVE (*)    All other components within normal limits  CBC WITH DIFFERENTIAL/PLATELET - Abnormal; Notable for the following components:   RBC 3.77 (*)    RDW 16.1 (*)    All other components within normal limits  COMPREHENSIVE METABOLIC PANEL - Abnormal; Notable for the following components:   Sodium 132 (*)    Glucose, Bld 196 (*)    Calcium 8.5 (*)    Albumin 3.4 (*)    All other components within normal limits  BLOOD GAS, VENOUS - Abnormal; Notable for the following components:   pO2, Ven <31.0 (*)    All other components within normal limits  CBG MONITORING, ED - Abnormal; Notable for the following components:   Glucose-Capillary 162 (*)  All other components within normal limits  TROPONIN I (HIGH SENSITIVITY) - Abnormal; Notable for the following components:   Troponin I (High Sensitivity) 43 (*)    All other components within normal limits  URINALYSIS, ROUTINE W REFLEX MICROSCOPIC  LACTIC ACID, PLASMA  TROPONIN I (HIGH SENSITIVITY)    DG Chest Portable 1 View  Final Result      Medications  acetaminophen (TYLENOL) tablet 650 mg (has no administration in time range)    Or  acetaminophen (TYLENOL) suppository 650 mg (has no administration in time range)  methylPREDNISolone sodium succinate (SOLU-MEDROL) 125 mg/2 mL injection 125 mg (125 mg Intravenous Given 08/04/21 0546)  ipratropium-albuterol (DUONEB) 0.5-2.5 (3) MG/3ML nebulizer solution 3 mL (3 mLs Nebulization Given 08/04/21 0417)     Procedures  /  Critical Care .Critical Care Performed  by: Maudie Flakes, MD Authorized by: Maudie Flakes, MD   Critical care provider statement:    Critical care time (minutes):  32   Critical care was necessary to treat or prevent imminent or life-threatening deterioration of the following conditions:  Respiratory failure   Critical care was time spent personally by me on the following activities:  Development of treatment plan with patient or surrogate, discussions with consultants, evaluation of patient's response to treatment, examination of patient, ordering and review of laboratory studies, ordering and review of radiographic studies, ordering and performing treatments and interventions, pulse oximetry, re-evaluation of patient's condition and review of old charts  ED Course and Medical Decision Making  I have reviewed the triage vital signs, the nursing notes, and pertinent available records from the EMR.  Listed above are laboratory and imaging tests that I personally ordered, reviewed, and interpreted and then considered in my medical decision making (see below for details).  Considering hypercarbia, CHF exacerbation, electrolyte disturbance, COVID-19.  Patient also with a new left bundle branch block as compared to October 2022.  Denies any chest pain but not really answering questions very well at this time.  No focal neurological symptoms.  Awaiting labs, providing supplemental oxygen.     Work-up overall reassuring, patient has tested positive for COVID-19.  She is on 6 L high flow nasal cannula which is an increase from her baseline.  To be admitted to hospital service.  Barth Kirks. [place]onia Small, [place] [email or web address]  Final Clinical Impressions(s) / ED Diagnoses     ICD-10-CM   1. COVID-19  U07.1       ED Discharge Orders     None        Discharge Instructions Discussed with and Provided to Patient:   Discharge Instructions   None       Maudie Flakes,  MD 08/04/21 [phone]

## 2021-08-04 NOTE — ED Notes (Signed)
Patient was given her lunch tray. 

## 2021-08-04 NOTE — Progress Notes (Signed)
Carryover to day admitter.  I discussed the patient's case with the EDP, Dr.Bero.  Per these discussions:  This is a 65 year old female with a history of chronic hypoxic respiratory failure in setting of severe COPD on 3 L continuous nasal cannula who is being admitted for acute on chronic hypoxic respiratory failure in the setting of COVID-19 infection after presenting with shortness of breath over the last few days, and noted to exhibit some altered mental status/confusion relative to baseline.  I have placed an inpatient admit order to pcu.  Additionally, I have placed some basic initial admission orders via the adult multi morbid admission order set. Of note, will need some clarification regarding the patient's CODE STATUS as there has been some variability in DNR versus full code per most recent CODE STATUS entries.  Additionally, in the setting of concern for altered mental status, patient n.p.o. for now pending further evaluation.     Babs Bertin, DO Hospitalist

## 2021-08-04 NOTE — H&P (Signed)
History and Physical    Katherine [name] ZOX:[id number] DOB: 1956/03/18 DOA: 08/04/2021  PCP: [name], MD   Patient coming from: Home.  I have personally briefly reviewed patient's old medical records in [place]  Chief Complaint: Generalized weakness and shortness of breath.  HPI: Katherine [name] is a 65 y.o. female with medical history significant of anemia, anxiety, depression, bilateral carotid artery stenosis, CAD/CABG, history of PCI x5, mixed type COPD, type II DM, persistent diarrhea postcholecystectomy, history of ESRD with cadaveric renal transplant and normal GFR at this time, fibromyalgia, GERD, history of GI bleed, history of urolithiasis, history of other nonhemorrhagic, per staff the point, history of QT prolongation, class III obesity, hypertension, allergic to stenosis, OSA no longer on CPAP, PAD, paroxysmal atrial fibrillation, mixed type COPD on home oxygen at 3 LPM who is coming to the emergency department complaints of mild confusion, progressively worse weakness for the past few days associated with worsening chronic respiratory failure with hypoxia 84% now with an oxygen requirement of 6 LPM on HF [place] after being exposed to her niece who has influenza A earlier in the week.  She has also had subjective low-grade fever, chills, fatigue, malaise, headache, sore throat, rhinorrhea, dry cough which is occasionally productive of yellowish sputum, pleuritic chest pain, mild wheezing but no hemoptysis.  She has also had several episodes of loose stool but none since Friday.  She had 3 episodes of emesis a few days ago as well.  Her confusion improved with oxygen in the ED.  She denied typical chest pain, palpitations, diaphoresis,  ED Course: Initial vital signs were temperature 99.9 F, pulse 87, respirations 26, BP 150/75 mmHg O2 sat 91% on room air.  Patient received a DuoNeb, magnesium sulfate 2 g IVPB and methylprednisolone 125 mg IVPB.  Lab work: Urinalysis  showed proteinuria 100 mg deciliter and small leukocyte esterase.  CBC showed a white count 5.4, hemoglobin 12.0 g deciliter platelets 197.  COVID PCR was positive.  CMP showed normal electrolytes when sodium corrected to glucose and calcium to albumin.  Glucose 196 mg/dL and albumin 3.4 g/dL.  The rest of the CMP values were unremarkable.  BNP 189.6 pg mL.  Troponin was 43 and then 49 ng/L.  Magnesium was 1.5 mg/dL.  D-dimer, ferritin and CRP were elevated.  Imaging: A portable 1 view chest radiograph showed bilateral atelectasis and/or infiltrate.  Please see image and full radiology report for further detail.  Review of Systems: As per HPI otherwise all other systems reviewed and are negative.  Past Medical History:  Diagnosis Date   Anemia    Anxiety    Bilateral carotid artery stenosis    Carotid duplex 03/1190: 4-78% LICA, 29-56% RICA, >21% RECA, f/u 1 yr suggested   CAD (coronary artery disease) of bypass graft 5/01; 3/'02, 8/'03, 10/'04; 1/15   PCI x 5 to SVG-D1    CAD in native artery 07/1993   3 Vessel Disease (LAD-D1 & RCA) -- CABG (Dx in setting of inferior STEMI-PTCA of RCA)   CAD S/P percutaneous coronary angioplasty    PCI to SVG-D1 insertion/native D1 x 4 = '01 -(S660 BMS 2.5 x 9 anastomosis- D1); '02 - distal overlap ACS Pixel 2.5 x 8  BMS; '03 distal/native ISR/Thrombosis - Pixel 2.5 x 13; '04 - ISR-  Taxus 2.5 x 20 (covered all);; 1/15 - mid SVG-D1 (50% distal ISR) - Promus P 2.75 x 20 -- 2.8 mm; 6/22: Extensive ISR PTCA & Anastomotic-Native Diag  DES PCI (Frontier Onyx 2.25x12 (2.75-2.5 mm post-dilation   COPD mixed type ([place])    Followed by Dr. Lamonte Sakai "pulmonologist said no COPD"   Depression with anxiety    Diabetes mellitus type 2 in obese ([place])    Diarrhea    started after cholecystectomy and mass removed from intestine   Dyslipidemia, goal LDL below 70    08/2012: TC 137, TG 200, HDL 32!, LDL 45; on statin (followed by Dr.Deterding)   ESRD (end stage renal disease)  ([place]) 1991   s/p Cadaveric Renal Transplant [facility] - Dr. Jimmy Footman)    Family history of adverse reaction to anesthesia    mom's bp dropped during/after anesthesia   Fibromyalgia    GERD (gastroesophageal reflux disease)    Glomerulonephritis, chronic, rapidly progressive 1989   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 07/1993   Rescue PTCA of RCA -- referred for CABG.   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  Had having having lots of nausea and vomiting with poor p.o. intake.  Thought to have QT prolongation with multiple medications involved and hypomagnesemia, hypokalemia.  Tikosyn discontinued along with Zoloft and Phenergan.   Hypertension associated with diabetes ([place])    Mild aortic stenosis by prior echocardiogram 07/2019   Echo:  Mild aortic stenosis (gradients: Mean 14.3 mmHg -peak 24.9 mmHg).   Morbid obesity (HCC)    MRSA (methicillin resistant staph aureus) culture positive    OSA (obstructive sleep apnea)    no longer on CPAP or home O2, states she doesn't need now after lap band   PAD (peripheral artery disease) ([place]) 08/2013   LEA Dopplers to be read by Dr. Fletcher Anon   PAF (paroxysmal atrial fibrillation) ([place]) 06/2014   Noted on CardioNet Monitor  - --> rhythm control with Tikosyn (Dr. Rayann Heman); converted from warfarin to apixaban for anticoagulation.   Pneumonia    Recurrent boils    Bilateral Groin   Rheumatoid arthritis ([place])    Per Patient Report; associated with OA   S/p cadaver renal transplant 1991   [facility]   Torsades de pointes 05/12/2021   Unstable angina ([place]) 5/01; 3/'02, 8/'03, 10/'04; 1/15   x 5 occurences since Inf-Post STEMI in 1994   Past Surgical History:   Procedure Laterality Date   ABDOMINAL AORTOGRAM N/A 04/21/2018   Procedure: ABDOMINAL AORTOGRAM;  Surgeon: [name], MD;  Location: [place] CV LAB;  Service: Cardiovascular;  Laterality: N/A;   CATHETER REMOVAL     CHOLECYSTECTOMY N/A 10/29/2014   Procedure: LAPAROSCOPIC CHOLECYSTECTOMY WITH INTRAOPERATIVE CHOLANGIOGRAM;  Surgeon: Excell Seltzer, MD;  Location: [facility];  Service: General;  Laterality: N/A;   CORONARY ANGIOPLASTY  1994   x5   CORONARY ARTERY BYPASS GRAFT  1995   LIMA-LAD, SVG-RPDA, SVG-D1   CORONARY STENT INTERVENTION N/A 02/20/2021   Procedure: PERCUTANEOUS CORONARY STENT INTERVENTION;  Surgeon: [name], MD;  [facility]: [facility]; ostLCx 60% (Neg RFR 0.96);; SVG- D2 recurrent 90% ISR & 95% native D2 after graft-> DES PCI of 95% anastomotic D2 lesion (Onyx Frontier 2.25 mm x 12 mm => 2.75 mm @ overlap, 2.5 distal.);PTCA of  ISR in body of graft. ->  2.5 mm scoring balloon & post-dil w/ 2.75 mm Harvard balloon   ESOPHAGOGASTRODUODENOSCOPY N/A 10/15/2016   Procedure: ESOPHAGOGASTRODUODENOSCOPY (EGD);  Surgeon: Wilford Corner, MD;  Location[facility] ENDOSCOPY;  Service: Endoscopy;  Laterality: N/A;   I & D EXTREMITY Right 01/29/2018   Procedure: IRRIGATION AND DEBRIDEMENT THUMB;  Surgeon: Dayna Barker, MD;  Location: [place];  Service: Plastics;  Laterality: Right;   INCISE AND DRAIN ABCESS     INTRAVASCULAR PRESSURE WIRE/FFR STUDY N/A 02/20/2021   Procedure: INTRAVASCULAR PRESSURE WIRE/FFR STUDY;  Surgeon: [name], MD;  Location: [place] [facility];  Service: Cardiovascular;  Laterality: N/A;   KIDNEY TRANSPLANT  1991   KNEE ARTHROSCOPY WITH LATERAL MENISECTOMY Left 12/03/2017   Procedure: LEFT KNEE ARTHROSCOPY WITH LATERAL MENISECTOMY;  Surgeon: Earlie Server, MD;  Location: [place];  Service: Orthopedics;  Laterality: Left;   LAPAROSCOPIC GASTRIC BANDING  04/2004; 10/'09, 2/'10   Port Replacement x 2   LEFT HEART CATH AND CORONARY ANGIOGRAPHY  N/A 02/20/2021   Procedure: LEFT HEART CATH AND CORONARY ANGIOGRAPHY;  Surgeon: [name], MD;  Location: [place] CV LAB;  Service: Cardiovascular;  Laterality: N/A;   LEFT HEART CATH AND CORS/GRAFTS ANGIOGRAPHY N/A 04/21/2018   Procedure: LEFT HEART CATH AND CORS/GRAFTS ANGIOGRAPHY;  Surgeon: [name], MD;  Location: [place] CV LAB;  Ost-Prox LAD 50% - proxLAD (pre & post D1) 100% CTO. Cx - patent, small OM1 (stable ~ ostial OM1 90%, too small for PCI) & 2 small LPL; Ost-distal RCA 100% CTO.  LIMA-LAD (not injected); SVG-dRCA patent, SVG-D1 - insertion stent ~20% ISR - Severe R CFA disease w/ focal Sub TO   LEFT HEART CATH AND CORS/GRAFTS ANGIOGRAPHY  5/'01, 3/'02, 8/'03, 10/'04; 1/'15   08/22/2013: LAD & RCA 100%; LIMA-LAD & SVG-rPDA patent; Cx-- OM1 60%, OM2 ostial ~50%; SVG-D1 - 80% mid, 50% distal ISR --PCI   LEFT HEART CATH AND CORS/GRAFTS ANGIOGRAPHY N/A 01/31/2021   Procedure: LEFT HEART CATH AND CORS/GRAFTS ANGIOGRAPHY;  Surgeon: Jolaine Artist, MD;  [facility];;   LEFT HEART CATHETERIZATION WITH CORONARY/GRAFT ANGIOGRAM N/A 08/23/2013   Procedure: LEFT HEART CATHETERIZATION WITH Beatrix Fetters;  Surgeon: Wellington Hampshire, MD;  Location: [place] CATH LAB;  Service: Cardiovascular;  Laterality: N/A;   Lower Extremity Arterial Dopplers  08/2013   ABI: R 0.96, L 1.04   MULTIPLE TOOTH EXTRACTIONS  age 28   NM MYOVIEW LTD  03/2016   EF 62%. LOW RISK. C/W prior MI - no Ischemia. Apical hypokinesis.   PERCUTANEOUS CORONARY STENT INTERVENTION (PCI-S)  5/'01, 3/'02, 8/'03, 10/'04;   '01 - S660 BMS 2.5 x 9 - dSVG-D1 into D1; '02- post-stent stenosis - 2.5 x 8 Pixel BMS; '8\03: ISR/Thrombosis into native D1 - AngioJet, 2.5 x 13 Pixel; '04 - ISR 95% - covered stented area with Taxus DES 2.5 mm x 20 (2.88)   PERCUTANEOUS CORONARY STENT INTERVENTION (PCI-S)  08/23/2013   Procedure: PERCUTANEOUS CORONARY STENT INTERVENTION (PCI-S);  Surgeon: Wellington Hampshire, MD;  Location: [facility] CATH LAB;  Service: Cardiovascular;;mid SVG-D1 80%; distal stent ~50% ISR; Promus [place] DES 2.75 mm xc 20 mm (2.8 mm)   PORT-A-CATH REMOVAL     kidney   TRANSTHORACIC ECHOCARDIOGRAM  07/2019   a) 07/2019: EF 55 to 60%.  No LVH.  Paradoxical septal WM-s/p CABG.  GRII DD.  Nl RV size and fxn.  Mild bilateral atrial dilation.  Mod MAC.  Trace MR.  Mild AS (gradients: Mean 14.3 mmHg -peak 24.9 mmHg).; B) 06/2020: EF 40 to 45%.  Moderate concentric LVH.  GRII DD.  Elevated LAP.  Mod HK mid Apical Ant-AntSept wall & mild Apical Dyskinesis.  Mod LA dilation.  Mild MR.  AoV sclerosis w/o AS.   TRANSTHORACIC ECHOCARDIOGRAM  01/30/2021   EF 55 to 60%.  Mild LVH.  GR 1 DD.  Elevated LAP.  Moderate LA dilation.  Mild MR with mild MS.  Mild aortic valve stenosis.:   TUBAL LIGATION     wrist fistula repair Left    dialysis for one year   Social History  reports that she has been smoking cigarettes. She has a 30.00 pack-year smoking history. She has never used smokeless tobacco. She reports that she does not drink alcohol and does not use drugs.  Allergies  Allergen Reactions   Tetracycline Hives    Patient tolerated Doxycycline Dec 2020   Niacin Other (See Comments)    Mouth blisters   Niaspan [Niacin Er] Other (See Comments)    Mouth blisters   Sulfa Antibiotics Nausea Only and Other (See Comments)    "Tears up stomach"   Sulfonamide Derivatives Other (See Comments)    Reaction: per patient "tears her stomach up"   Codeine Nausea And Vomiting   Erythromycin Nausea And Vomiting   Hydromorphone Hcl Nausea And Vomiting   Morphine And Related Nausea And Vomiting   Nalbuphine Nausea And Vomiting    Nubain   Sulfasalazine Nausea Only and Other (See Comments)    per patient "tears her stomach up", "Tears up stomach"   Tape Rash and Other (See Comments)    No "plastic" tape," please----cloth tape only   Family History  Problem Relation Age of Onset   Cancer Mother         liver   Heart disease Father    Cancer Father        colon   Arrhythmia Brother        Atrial Fibrillation   Arrhythmia Paternal Aunt        Atrial Fibrillation   Prior to Admission medications   Medication Sig Start Date End Date Taking? Authorizing Provider  acetaminophen (TYLENOL) 500 MG tablet Take 1,000 mg by mouth daily.    [provider]  albuterol (VENTOLIN HFA) 108 (90 Base) MCG/ACT inhaler TAKE 2 PUFFS BY MOUTH EVERY 6 HOURS AS NEEDED FOR WHEEZE OR SHORTNESS OF BREATH 02/10/21   Byrum, Rose Fillers, MD  apixaban (ELIQUIS) 5 MG TABS tablet Take 1 tablet (5 mg total) by mouth 2 (two) times daily. APPOINTMENT NEEDED WITH CARDIOLOGIST FOR FURTHER REFILLS Patient taking differently: Take 5 mg by mouth 2 (two) times daily. 04/17/21   Sherran Needs, NP  arformoterol (BROVANA) 15 MCG/2ML NEBU Take 2 mLs (15 mcg total) by nebulization 2 (two) times daily. 08/30/19   Collene Gobble, MD  azaTHIOprine (IMURAN) 50 MG tablet Take 125 mg by mouth See admin instructions. Take 2 1/2 tablets (125 mg) by mouth daily at 3pm    [provider]  budesonide (PULMICORT) 0.5 MG/2ML nebulizer solution Take 2 mLs (0.5 mg total) by nebulization 2 (two) times daily. 08/24/19   [name], MD  calcitRIOL (ROCALTROL) 0.25 MCG capsule Take 0.25 mcg by mouth every 3 (three) days.     [provider]  cetirizine (ZYRTEC) 5 MG tablet Take 5 mg by mouth daily.    [provider]  clopidogrel (PLAVIX) 75  MG tablet Take 1 tablet (75 mg total) by mouth daily. 02/22/21   Cheryln Manly, NP  clotrimazole (MYCELEX) 10 MG troche Take 1 tablet (10 mg total) by mouth 5 (five) times daily as needed (thrush). Patient not taking: Reported on 06/19/2021 02/03/21   Edwin Dada, MD  cyclobenzaprine (FLEXERIL) 10 MG tablet TAKE 2 TAB DAILY AT BEDTIME, MAY ALSO TAKE 1 TABLET BY MOUTH AT NOON AS NEEDED FOR MUSCLE SPASMS 04/18/21   Meredith Staggers, MD  fluticasone (FLONASE) 50 MCG/ACT  nasal spray PLACE 2 SPRAYS INTO BOTH NOSTRILS 2 TIMES DAILY. 06/27/21   Collene Gobble, MD  glimepiride (AMARYL) 4 MG tablet Take 4 mg by mouth daily. 02/03/20   [provider]  insulin aspart (NOVOLOG FLEXPEN) 100 UNIT/ML FlexPen Inject 14 Units into the skin 3 (three) times daily with meals.    [provider]  Insulin Glargine (BASAGLAR KWIKPEN) 100 UNIT/ML SOPN Inject 12 Units into the skin daily before breakfast.    [provider]  isosorbide mononitrate (IMDUR) 60 MG 24 hr tablet Take 1 tablet (60 mg total) by mouth daily. 03/22/21   Debbe Odea, MD  lamoTRIgine (LAMICTAL) 25 MG tablet TAKE 1 TABLET BY MOUTH EVERYDAY AT BEDTIME 07/25/21   Meredith Staggers, MD  LORazepam (ATIVAN) 1 MG tablet Take 1 tablet (1 mg total) by mouth 2 (two) times daily. 03/20/21   [name], MD  magnesium oxide (MAG-OX) 400 (240 Mg) MG tablet Take 1 tablet (400 mg total) by mouth daily. Patient not taking: No sig reported 04/23/21   Pokhrel, Corrie Mckusick, MD  metFORMIN (GLUCOPHAGE) 500 MG tablet Take 1 tablet (500 mg total) by mouth 2 (two) times daily. 02/22/21   Burnell Blanks, MD  metoprolol tartrate (LOPRESSOR) 100 MG tablet Take 1 tablet (100 mg total) by mouth 2 (two) times daily. Appointment Required For Further Refills [phone] 03/22/21   Debbe Odea, MD  montelukast (SINGULAIR) 10 MG tablet TAKE 1 TABLET BY MOUTH EVERYDAY AT BEDTIME 06/23/21   Byrum, Rose Fillers, MD  nitroGLYCERIN (NITROSTAT) 0.4 MG SL tablet Place 1 tablet (0.4 mg total) under the tongue every 5 (five) minutes as needed for chest pain. Patient not taking: Reported on 06/19/2021 02/21/21   Burnell Blanks, MD  Oxycodone HCl 10 MG TABS Take 1 tablet (10 mg total) by mouth 4 (four) times daily as needed. 07/24/21   Bayard Hugger, NP  OXYGEN Inhale 3 L into the lungs continuous.    [provider]  pantoprazole (PROTONIX) 40 MG tablet TAKE 1 TABLET BY MOUTH EVERY DAY 07/31/21   [name], MD   predniSONE (DELTASONE) 5 MG tablet Take 5 mg by mouth every morning. 06/19/19   [provider]  promethazine (PHENERGAN) 25 MG tablet Take 25 mg by mouth every 6 (six) hours as needed for nausea or vomiting.    [provider]  Respiratory Therapy Supplies (NEBULIZER) DEVI Pt needs nebulizr machine dx J44.9 07/17/21   Collene Gobble, MD  rosuvastatin (CRESTOR) 20 MG tablet TAKE 1 TABLET BY MOUTH EVERY DAY 06/16/21   [name], MD  rosuvastatin (CRESTOR) 40 MG tablet Take 1 tablet (40 mg total) by mouth daily. Patient not taking: Reported on 06/19/2021 02/03/21   Edwin Dada, MD  sertraline (ZOLOFT) 100 MG tablet Take 1 tablet (100 mg total) by mouth daily. 06/06/21   [name], MD   Physical Exam: Vitals:   08/04/21 2409 08/04/21 7353  08/04/21 0417 08/04/21 0545  BP: (!) 158/75   (!) 137/58  Pulse: 87   82  Resp: (!) 26   16  Temp: 99.9 F (37.7 C)     TempSrc: Oral     SpO2: 91% (!) 84% 95% 91%   Constitutional: Acutely ill-appearing, but nontoxic. Eyes: PERRL, lids and conjunctivae normal ENMT: Mucous membranes are moist. Posterior pharynx clear of any exudate or lesions. Neck: normal, supple, no masses, no thyromegaly Respiratory: Decreased breath sounds with bibasilar crackles. Normal respiratory effort. No accessory muscle use.  Cardiovascular: Regular rate and rhythm, no murmurs / rubs / gallops. No extremity edema. 2+ pedal pulses. No carotid bruits.  Abdomen: Obese, no distention.  Soft no tenderness, no masses palpated. No hepatosplenomegaly. Bowel sounds positive.  Musculoskeletal: no clubbing / cyanosis. Good ROM, no contractures. Normal muscle tone.  Skin: no acute rashes, lesions, ulcers on very limited otological examination. Neurologic: CN 2-12 grossly intact. Sensation intact, DTR normal. Strength 5/5 in all 4.  Psychiatric: Normal judgment and insight. Alert and oriented x 3. Normal mood.   Labs on Admission: I have personally  reviewed following labs and imaging studies  CBC: Recent Labs  Lab 08/04/21 0322  WBC 5.4  NEUTROABS 4.0  HGB 12.0  HCT 37.7  MCV 100.0  PLT 939   Basic Metabolic Panel: Recent Labs  Lab 08/04/21 0322 08/04/21 0635  NA 132*  --   K 4.2  --   CL 99  --   CO2 23  --   GLUCOSE 196*  --   BUN 19  --   CREATININE 0.99  --   CALCIUM 8.5*  --   MG  --  1.5*  PHOS  --  2.7   GFR: CrCl cannot be calculated (Unknown ideal weight.).  Liver Function Tests: Recent Labs  Lab 08/04/21 0322  AST 37  ALT 12  ALKPHOS 87  BILITOT 0.8  PROT 7.3  ALBUMIN 3.4*   Radiological Exams on Admission: DG Chest Portable 1 View  Result Date: 08/04/2021 CLINICAL DATA:  COVID positive with generalized weakness. EXAM: PORTABLE CHEST 1 VIEW COMPARISON:  None. FINDINGS: Multiple sternal wires and vascular clips are seen. Mild, ill-defined areas of atelectasis and/or infiltrate are seen throughout the right lung and periphery of the left lung base. There is no evidence of a pleural effusion or pneumothorax. The cardiac silhouette is mildly enlarged and unchanged in size. The visualized skeletal structures are unremarkable. IMPRESSION: 1. Evidence of prior median sternotomy/CABG. 2. Ill-defined areas of bilateral atelectasis and/or infiltrate. Electronically Signed   By: Virgina Norfolk M.D.   On: 08/04/2021 03:57    EKG: Independently reviewed.  Vent. rate 85 BPM PR interval 67 ms QRS duration 141 ms QT/QTcB 407/484 ms P-R-T axes 47 -42 127 Sinus rhythm Short PR interval Left bundle branch block  Assessment/Plan Principal Problem:   Acute on chronic respiratory failure with hypoxia (HCC) In the setting of   COPD exacerbated by   Acute respiratory disease due to COVID-19 virus Admit to stepdown unit/inpatient. Continue high flow nasal cannula. Flutter valve and incentive spirometry. Continue dexamethasone 6 mg IVP daily. Continue remdesivir per pharmacy. Hold  azathioprine. Follow-up CBC, CMP and inflammatory markers.  Active Problems:   Hypomagnesemia Supplemented. Follow-up magnesium level.    CAD (coronary artery disease) of bypass graft Continue clopidogrel, metoprolol and rosuvastatin.    Dyslipidemia, goal LDL below 70 Continue rosuvastatin 20 mg p.o. daily.    PAD (peripheral artery disease) ([place]) On  a statin and antiplatelet therapy.    Paroxysmal atrial fibrillation (HCC);  CHA2DS2VASc score F, HTN, CAD, CVA = 5 Continue apixaban 5 mg p.o. twice daily. Continue metoprolol for rate control.    OSA (obstructive sleep apnea) No longer on CPAP.    GAD (generalized anxiety disorder) Continue sertraline 100 mg p.o. daily. Continue lorazepam 1 mg p.o. twice daily as needed.    Type 2 diabetes mellitus with hyperglycemia (HCC) Carbohydrate modified diet. Continue Lantus 12 units daily. CBG monitoring with RI SS every 4 hours. Tradjenta 5 mg p.o. daily while on glucocorticoids.    DVT prophylaxis: On apixaban. Code Status:   Full code. Family Communication:   Disposition Plan:   Patient is from:  Home.  Anticipated DC to:  TBD.  Anticipated DC date:  08/07/2021 or 08/08/2021.[name] Kitchen  Anticipated DC barriers: Clinical status.  Consults called:   Admission status:  Inpatient/stepdown.   Severity of Illness: High severity in the setting of acute on chronic respiratory failure with worsening of mixed type COPD and above-mentioned constitutional symptoms in the setting of COVID-19 disease.   Reubin Milan MD [facility]  How to contact the [facility] Attending or Consulting provider [place] or covering provider during after hours [place], for this patient?   Check the care team in [facility] and look for a) attending/consulting [facility] provider listed and b) the [facility] team listed Log into [email or web address] and use [place]'s universal password to access. If you do not have the password, please contact the hospital operator. Locate the  [facility] provider you are looking for under [facility] and page to a number that you can be directly reached. If you still have difficulty reaching the provider, please page the [facility] (Director on Call) for the Hospitalists listed on amion for assistance.  08/04/2021, 8:01 AM   This document was preparation Dragon voice recognition software may contain some unintended transcription errors.

## 2021-08-04 NOTE — ED Triage Notes (Addendum)
Pt BIB EMS, per pt, her family has been sick with covid this past week. Pt complains of generalized weakness. Pt also reports burning with urination. Pt is on 3 L O2 per baseline.

## 2021-08-05 ENCOUNTER — Inpatient Hospital Stay (HOSPITAL_COMMUNITY): Payer: Medicare Other

## 2021-08-05 DIAGNOSIS — U071 COVID-19: Secondary | ICD-10-CM | POA: Diagnosis present

## 2021-08-05 DIAGNOSIS — J9621 Acute and chronic respiratory failure with hypoxia: Secondary | ICD-10-CM | POA: Diagnosis not present

## 2021-08-05 DIAGNOSIS — E669 Obesity, unspecified: Secondary | ICD-10-CM | POA: Diagnosis present

## 2021-08-05 LAB — PROCALCITONIN: Procalcitonin: 0.4 ng/mL

## 2021-08-05 LAB — CBC WITH DIFFERENTIAL/PLATELET
Abs Immature Granulocytes: 0.03 10*3/uL (ref 0.00–0.07)
Basophils Absolute: 0 10*3/uL (ref 0.0–0.1)
Basophils Relative: 0 %
Eosinophils Absolute: 0 10*3/uL (ref 0.0–0.5)
Eosinophils Relative: 0 %
HCT: 34.6 % — ABNORMAL LOW (ref 36.0–46.0)
Hemoglobin: 11.3 g/dL — ABNORMAL LOW (ref 12.0–15.0)
Immature Granulocytes: 1 %
Lymphocytes Relative: 5 %
Lymphs Abs: 0.3 10*3/uL — ABNORMAL LOW (ref 0.7–4.0)
MCH: 31.7 pg (ref 26.0–34.0)
MCHC: 32.7 g/dL (ref 30.0–36.0)
MCV: 97.2 fL (ref 80.0–100.0)
Monocytes Absolute: 0.3 10*3/uL (ref 0.1–1.0)
Monocytes Relative: 5 %
Neutro Abs: 4.4 10*3/uL (ref 1.7–7.7)
Neutrophils Relative %: 89 %
Platelets: 177 10*3/uL (ref 150–400)
RBC: 3.56 MIL/uL — ABNORMAL LOW (ref 3.87–5.11)
RDW: 15.8 % — ABNORMAL HIGH (ref 11.5–15.5)
WBC: 5 10*3/uL (ref 4.0–10.5)
nRBC: 0 % (ref 0.0–0.2)

## 2021-08-05 LAB — EXPECTORATED SPUTUM ASSESSMENT W GRAM STAIN, RFLX TO RESP C

## 2021-08-05 LAB — COMPREHENSIVE METABOLIC PANEL
ALT: 9 U/L (ref 0–44)
AST: 35 U/L (ref 15–41)
Albumin: 2.8 g/dL — ABNORMAL LOW (ref 3.5–5.0)
Alkaline Phosphatase: 74 U/L (ref 38–126)
Anion gap: 8 (ref 5–15)
BUN: 15 mg/dL (ref 8–23)
CO2: 23 mmol/L (ref 22–32)
Calcium: 8.5 mg/dL — ABNORMAL LOW (ref 8.9–10.3)
Chloride: 101 mmol/L (ref 98–111)
Creatinine, Ser: 0.77 mg/dL (ref 0.44–1.00)
GFR, Estimated: >60 mL/min (ref >60)
Glucose, Bld: 176 mg/dL — ABNORMAL HIGH (ref 70–99)
Potassium: 4.1 mmol/L (ref 3.5–5.1)
Sodium: 132 mmol/L — ABNORMAL LOW (ref 135–145)
Total Bilirubin: 1.1 mg/dL (ref 0.3–1.2)
Total Protein: 6.8 g/dL (ref 6.5–8.1)

## 2021-08-05 LAB — FERRITIN: Ferritin: 755 ng/mL — ABNORMAL HIGH (ref 11–307)

## 2021-08-05 LAB — GLUCOSE, CAPILLARY
Glucose-Capillary: 169 mg/dL — ABNORMAL HIGH (ref 70–99)
Glucose-Capillary: 169 mg/dL — ABNORMAL HIGH (ref 70–99)
Glucose-Capillary: 235 mg/dL — ABNORMAL HIGH (ref 70–99)
Glucose-Capillary: 434 mg/dL — ABNORMAL HIGH (ref 70–99)
Glucose-Capillary: 439 mg/dL — ABNORMAL HIGH (ref 70–99)
Glucose-Capillary: 346 mg/dL — ABNORMAL HIGH (ref 70–99)

## 2021-08-05 LAB — STREP PNEUMONIAE URINARY ANTIGEN: Strep Pneumo Urinary Antigen: NEGATIVE

## 2021-08-05 LAB — C-REACTIVE PROTEIN: CRP: 18.6 mg/dL — ABNORMAL HIGH (ref <1.0)

## 2021-08-05 LAB — MRSA NEXT GEN BY PCR, NASAL: MRSA by PCR Next Gen: NOT DETECTED

## 2021-08-05 LAB — PHOSPHORUS: Phosphorus: 2.9 mg/dL (ref 2.5–4.6)

## 2021-08-05 LAB — MAGNESIUM: Magnesium: 2.2 mg/dL (ref 1.7–2.4)

## 2021-08-05 LAB — D-DIMER, QUANTITATIVE: D-Dimer, Quant: 1.83 ug/mL-FEU — ABNORMAL HIGH (ref 0.00–0.50)

## 2021-08-05 MED ORDER — BUDESONIDE 0.5 MG/2ML IN SUSP
2.0000 mL | Freq: Two times a day (BID) | RESPIRATORY_TRACT | Status: DC
  Administered 2021-08-05 – 2021-08-25 (×41): 0.5 mg via RESPIRATORY_TRACT
  Filled 2021-08-05 (×41): qty 2

## 2021-08-05 MED ORDER — SODIUM CHLORIDE 0.9 % IV SOLN: 100.0000 mg | Freq: Every day | INTRAVENOUS | Status: DC

## 2021-08-05 MED ORDER — INSULIN ASPART 100 UNIT/ML IJ SOLN: 0.0000 [IU] | Freq: Every day | INTRAMUSCULAR | Status: DC

## 2021-08-05 MED ORDER — FUROSEMIDE 10 MG/ML IJ SOLN
20.0000 mg | Freq: Once | INTRAMUSCULAR | Status: AC
  Administered 2021-08-05: 10:00:00 20 mg via INTRAVENOUS
  Filled 2021-08-05: qty 2

## 2021-08-05 MED ORDER — INSULIN ASPART 100 UNIT/ML IJ SOLN: 6.0000 [IU] | Freq: Three times a day (TID) | INTRAMUSCULAR | Status: DC

## 2021-08-05 MED ORDER — SODIUM CHLORIDE 0.9 % IV SOLN
100.0000 mg | Freq: Every day | INTRAVENOUS | Status: AC
  Administered 2021-08-06 – 2021-08-09 (×4): 100 mg via INTRAVENOUS
  Filled 2021-08-05 (×4): qty 20

## 2021-08-05 MED ORDER — INSULIN ASPART 100 UNIT/ML IJ SOLN
0.0000 [IU] | Freq: Every day | INTRAMUSCULAR | Status: DC
  Administered 2021-08-05: 21:00:00 4 [IU] via SUBCUTANEOUS
  Administered 2021-08-06: 22:00:00 3 [IU] via SUBCUTANEOUS
  Administered 2021-08-07: 22:00:00 4 [IU] via SUBCUTANEOUS
  Administered 2021-08-08: 21:00:00 2 [IU] via SUBCUTANEOUS
  Administered 2021-08-09: 22:00:00 5 [IU] via SUBCUTANEOUS
  Administered 2021-08-10: 22:00:00 2 [IU] via SUBCUTANEOUS

## 2021-08-05 MED ORDER — INSULIN ASPART 100 UNIT/ML IJ SOLN
0.0000 [IU] | Freq: Three times a day (TID) | INTRAMUSCULAR | Status: DC
  Administered 2021-08-05: 12:00:00 5 [IU] via SUBCUTANEOUS
  Administered 2021-08-06: 08:00:00 11 [IU] via SUBCUTANEOUS
  Administered 2021-08-06 (×2): 15 [IU] via SUBCUTANEOUS
  Administered 2021-08-07 (×2): 11 [IU] via SUBCUTANEOUS
  Administered 2021-08-07: 11:00:00 5 [IU] via SUBCUTANEOUS
  Administered 2021-08-08: 17:00:00 11 [IU] via SUBCUTANEOUS
  Administered 2021-08-08: 13:00:00 15 [IU] via SUBCUTANEOUS
  Administered 2021-08-08: 09:00:00 11 [IU] via SUBCUTANEOUS
  Administered 2021-08-09: 17:00:00 2 [IU] via SUBCUTANEOUS
  Administered 2021-08-09 (×2): 5 [IU] via SUBCUTANEOUS
  Administered 2021-08-10: 13:00:00 2 [IU] via SUBCUTANEOUS
  Administered 2021-08-10 – 2021-08-11 (×2): 5 [IU] via SUBCUTANEOUS
  Administered 2021-08-11: 14:00:00 4 [IU] via SUBCUTANEOUS

## 2021-08-05 MED ORDER — SODIUM CHLORIDE 0.9 % IV SOLN
2.0000 g | INTRAVENOUS | Status: AC
  Administered 2021-08-05 – 2021-08-09 (×5): 2 g via INTRAVENOUS
  Filled 2021-08-05 (×5): qty 20

## 2021-08-05 MED ORDER — SODIUM CHLORIDE 0.9 % IV SOLN
200.0000 mg | Freq: Once | INTRAVENOUS | Status: AC
  Administered 2021-08-05: 11:00:00 200 mg via INTRAVENOUS
  Filled 2021-08-05: qty 40
  Filled 2021-08-05: qty 200

## 2021-08-05 MED ORDER — SODIUM CHLORIDE 0.9 % IV SOLN
500.0000 mg | INTRAVENOUS | Status: DC
  Filled 2021-08-05: qty 5

## 2021-08-05 MED ORDER — PROMETHAZINE HCL 25 MG PO TABS
25.0000 mg | ORAL_TABLET | Freq: Once | ORAL | Status: AC
  Administered 2021-08-05: 25 mg via ORAL
  Filled 2021-08-05: qty 1

## 2021-08-05 MED ORDER — PROMETHAZINE HCL 25 MG PO TABS
25.0000 mg | ORAL_TABLET | Freq: Four times a day (QID) | ORAL | Status: DC | PRN
  Administered 2021-08-06 – 2021-08-18 (×6): 25 mg via ORAL
  Filled 2021-08-05 (×9): qty 1

## 2021-08-05 MED ORDER — IOHEXOL 350 MG/ML SOLN
75.0000 mL | Freq: Once | INTRAVENOUS | Status: AC | PRN
  Administered 2021-08-05: 12:00:00 75 mL via INTRAVENOUS

## 2021-08-05 MED ORDER — METHYLPREDNISOLONE SODIUM SUCC 125 MG IJ SOLR
100.0000 mg | Freq: Two times a day (BID) | INTRAMUSCULAR | Status: DC
  Administered 2021-08-05 – 2021-08-11 (×13): 100 mg via INTRAVENOUS
  Filled 2021-08-05 (×12): qty 2

## 2021-08-05 MED ORDER — INSULIN ASPART 100 UNIT/ML IJ SOLN
22.0000 [IU] | Freq: Once | INTRAMUSCULAR | Status: AC
  Administered 2021-08-05: 18:00:00 22 [IU] via SUBCUTANEOUS

## 2021-08-05 MED ORDER — IPRATROPIUM-ALBUTEROL 0.5-2.5 (3) MG/3ML IN SOLN
3.0000 mL | RESPIRATORY_TRACT | Status: DC | PRN
  Administered 2021-08-06 – 2021-08-08 (×3): 3 mL via RESPIRATORY_TRACT
  Filled 2021-08-05 (×3): qty 3

## 2021-08-05 NOTE — Assessment & Plan Note (Signed)
Stage II, medial rectal ulcer. Present on admission. Continue dressing changes

## 2021-08-05 NOTE — Progress Notes (Signed)
[facility] Progress Note  Patient: Katherine [name]    ZDG:[id number]  DOA: 08/04/2021    Date of Service: the patient was seen and examined on 08/05/2021  Brief hospital course: No notes on file  Assessment and Plan: * Acute on chronic respiratory failure with hypoxia ([place]) Uses 2 LPM at home. Currently on 7 LPM.  Saturation 93%. Worsened to require 12 LPM overnight. In the setting of COVID-19 infection with severe pneumonia. At risk for further worsening. Continue to monitor in the stepdown unit.  COVID-19 virus infection Severe acute COVID-19 Viral Pneumonia CXR: hazy bilateral peripheral opacities CT chest: GGO, consolidation Oxygen requirement: Currently on 7 LPM 93% saturation. CRP: 18 Remdesivir: Started on 12/20. Steroids: Received IV Solu-Medrol in the ER, then was given Decadron.  Now back on IV Solu-Medrol 1 mg/kg twice daily. Baricitinib/Actemra:  Actemra will be given immediately in the setting of acute worsening based on consent as below.  Actemra off label use - patient was told that if COVID-19 pneumonitis gets worse we might potentially use Actemra off label, she denies any known history of active diverticulitis, tuberculosis or hepatitis, no active Diverticulitis or known other active infections, understands the risks and benefits and wants to proceed with Actemra treatment if required. Antibiotics: Shortness of ceftriaxone. DVT Prophylaxis: SCDs Start: 08/04/21 0623 apixaban (ELIQUIS) tablet 5 mg  Encouraged the patient to sit in chair use flutter valve and I-S for pulmonary toiletry.  Monitor closely in ICU setting.  Isolation duration: First positive test date 12/18.  COPD (chronic obstructive pulmonary disease) ([place]) Placing the patient at high risk of poor outcome.  Continue inhalers.  Acute on chronic diastolic CHF (congestive heart failure) ([place]) Will treat with IV Lasix.  Monitor.  Use Lasix as needed.  Cystitis IV ceftriaxone. Follow-up on  cultures.   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 Currently on prednisone and Imuran. 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 Continue aggressive sliding scale insulin.  Hypomagnesemia Replaced.  Long term (current) use of antithrombotics/antiplatelets Currently on Eliquis for A. fib.  We will continue.  Paroxysmal atrial fibrillation ([place]); CHA2DS2VASc score F, HTN, CAD, CVA = 5 Placing the patient at high is a poor outcome. Continue anticoagulation.  CAD S/P percutaneous coronary angioplasty - PCI x 5 to SVG-D1 Prior history.  Monitor.  Obesity (BMI 30-39.9) Body mass index is 37.03 kg/m.  Placing the patient at high risk of poor outcome.  Monitor.  OSA (obstructive sleep apnea) Does not use CPAP at night after lap band.  PAD (peripheral artery disease) ([place]) Prior history.  Monitor.  Dyslipidemia, goal LDL below 70 Continue statin.  GAD (generalized anxiety disorder) Continue Zoloft and Ativan.  Pressure injury of skin Stage II, medial rectal ulcer. Present on admission. Continue dressing changes    Body mass index is 37.03 kg/m.    Pressure Injury 08/04/21 Rectum Stage 2 -  Partial thickness loss of dermis presenting as a shallow open injury with a red, pink wound bed without slough. (Active)  08/04/21 1718  Location: Rectum  Location Orientation:   Staging: Stage 2 -  Partial thickness loss of dermis presenting as a shallow open injury with a red, pink wound bed without slough.  Wound Description (Comments):   Present on Admission: Yes     Subjective: Continues to have  cough shortness of breath, no nausea no vomiting but no fever no chills.  No abdominal pain.  Objective: Severely hypoxic, currently  requiring 7 L of oxygen with saturation barely in 90s. Tachycardic.,  Tachypneic.  Blood pressure stable.  Exam: General: Appear in moderate distress, no Rash; Oral Mucosa Clear, moist. no Abnormal Neck Mass Or lumps, Conjunctiva normal  Cardiovascular: S1 and S2 Present, no Murmur, Respiratory: increased respiratory effort, Bilateral Air entry present and bilateral  Crackles, no wheezes Abdomen: Bowel Sound present, Soft and no tenderness Extremities: trace Pedal edema Neurology: alert and oriented to time, place, and person affect anxious. no new focal deficit Gait not checked due to patient safety concerns     Data Reviewed: CRP worsening, chest x-ray shows worsening as well.  Disposition:  Status is: Inpatient  Remains inpatient appropriate because: Severe hypoxia requiring acute inpatient stepdown level care.   Family Communication: Discussed with niece on the phone.  DVT Prophylaxis: SCDs Start: 08/04/21 0623 apixaban (ELIQUIS) tablet 5 mg   Time spent: The patient is critically ill with multiple organ systems failure and requires high complexity decision making for assessment and support, frequent evaluation and titration of therapies. Critical Care Time devoted to patient care services described in this note is 35 minutes   Author: Berle Mull  08/05/2021 9:53 PM  To reach On-call, see care teams to locate the attending and reach out via [email or web address]. Between 7PM-7AM, please contact night-coverage If you still have difficulty reaching the attending provider, please page the [facility] (Director on Call) for [facility] on amion for assistance.

## 2021-08-05 NOTE — Assessment & Plan Note (Signed)
Will treat with IV Lasix.  Monitor.  Use Lasix as needed.

## 2021-08-05 NOTE — Assessment & Plan Note (Addendum)
Due to COVID-19 infection with MRSA  pneumonia in setting of COPD.  Possible contribution from HF. Uses 2 LPM at home COVID test + on 12/18 CXR 12/24 with worsening severe diffuse bilateral airspace disease CT PE protocol chest 08/05/2021: limited for PE, but negative, extensive heterogenous and GGO throughout lungs  Satting in 90's, on 25 L HHFNC (slowly improving) Elevated BNP -> lasix as tolerated Completed remdesivir.  Continue IV steroids for covid and abx to cover for MRSA pneumonia She's immunosuppressed on antirejection meds with renal transplant (not candidate for baricitinib or actemra with MRSA infection and immunosuppression).  Poor prognosis with immunosuppression and covid.  COVID-19 Labs  Recent Labs    08/10/21 0515 08/11/21 0309  DDIMER 2.01* 1.43*  CRP  --  2.4*    COVID-19 Labs  Recent Labs    08/10/21 0515 08/11/21 0309  DDIMER 2.01* 1.43*  CRP  --  2.4*    Lab Results  Component Value Date   SARSCOV2NAA POSITIVE ([place]) 08/04/2021   SARSCOV2NAA NEGATIVE 05/12/2021   [place] NEGATIVE 04/19/2021   [place] NEGATIVE 03/30/2021

## 2021-08-05 NOTE — Assessment & Plan Note (Deleted)
Currently on Eliquis for A. fib.  We will continue.

## 2021-08-05 NOTE — Assessment & Plan Note (Addendum)
Adjust regimen as needed - poorly controlled with DM Basal insulin, bolus, SSI On tradjenta Holding Metformin and Amaryl

## 2021-08-05 NOTE — Assessment & Plan Note (Signed)
Continue Zoloft and Ativan.

## 2021-08-05 NOTE — Assessment & Plan Note (Signed)
Continue statin. 

## 2021-08-05 NOTE — Assessment & Plan Note (Addendum)
Continue Metoprolol (dose reduced due to brady) and Eliquis -> now on heparin gtt

## 2021-08-05 NOTE — Assessment & Plan Note (Addendum)
IV ceftriaxone.

## 2021-08-05 NOTE — Assessment & Plan Note (Deleted)
Placing the patient at high risk of poor outcome.  Continue inhalers.

## 2021-08-05 NOTE — Assessment & Plan Note (Addendum)
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 Receives prednisone and Imuran. Placing the patient at high risk for poor outcome secondary to chronic immunosuppressive status

## 2021-08-05 NOTE — Assessment & Plan Note (Signed)
Prior history.  Monitor. °

## 2021-08-05 NOTE — Assessment & Plan Note (Signed)
Replaced. °

## 2021-08-05 NOTE — Assessment & Plan Note (Addendum)
Prior history.  Monitor. W/u for chest pain above

## 2021-08-05 NOTE — Assessment & Plan Note (Addendum)
Body mass index is 37.03 kg/m.  Placing the patient at high risk of poor outcome.  Monitor.

## 2021-08-05 NOTE — Assessment & Plan Note (Signed)
Does not use CPAP at night after lap band.

## 2021-08-06 ENCOUNTER — Telehealth: Payer: Medicare Other

## 2021-08-06 DIAGNOSIS — I5032 Chronic diastolic (congestive) heart failure: Secondary | ICD-10-CM | POA: Diagnosis present

## 2021-08-06 DIAGNOSIS — Z9861 Coronary angioplasty status: Secondary | ICD-10-CM

## 2021-08-06 DIAGNOSIS — J449 Chronic obstructive pulmonary disease, unspecified: Secondary | ICD-10-CM | POA: Diagnosis not present

## 2021-08-06 DIAGNOSIS — I2510 Atherosclerotic heart disease of native coronary artery without angina pectoris: Secondary | ICD-10-CM

## 2021-08-06 DIAGNOSIS — J9621 Acute and chronic respiratory failure with hypoxia: Secondary | ICD-10-CM | POA: Diagnosis not present

## 2021-08-06 DIAGNOSIS — I5042 Chronic combined systolic (congestive) and diastolic (congestive) heart failure: Secondary | ICD-10-CM | POA: Diagnosis present

## 2021-08-06 LAB — COMPREHENSIVE METABOLIC PANEL
ALT: 10 U/L (ref 0–44)
AST: 23 U/L (ref 15–41)
Albumin: 2.8 g/dL — ABNORMAL LOW (ref 3.5–5.0)
Alkaline Phosphatase: 85 U/L (ref 38–126)
Anion gap: 12 (ref 5–15)
BUN: 19 mg/dL (ref 8–23)
CO2: 23 mmol/L (ref 22–32)
Calcium: 8.6 mg/dL — ABNORMAL LOW (ref 8.9–10.3)
Chloride: 96 mmol/L — ABNORMAL LOW (ref 98–111)
Creatinine, Ser: 0.92 mg/dL (ref 0.44–1.00)
GFR, Estimated: >60 mL/min (ref >60)
Glucose, Bld: 254 mg/dL — ABNORMAL HIGH (ref 70–99)
Potassium: 3.5 mmol/L (ref 3.5–5.1)
Sodium: 131 mmol/L — ABNORMAL LOW (ref 135–145)
Total Bilirubin: 0.5 mg/dL (ref 0.3–1.2)
Total Protein: 6.5 g/dL (ref 6.5–8.1)

## 2021-08-06 LAB — CBC WITH DIFFERENTIAL/PLATELET
Abs Immature Granulocytes: 0.04 10*3/uL (ref 0.00–0.07)
Basophils Absolute: 0 10*3/uL (ref 0.0–0.1)
Basophils Relative: 0 %
Eosinophils Absolute: 0 10*3/uL (ref 0.0–0.5)
Eosinophils Relative: 0 %
HCT: 29.8 % — ABNORMAL LOW (ref 36.0–46.0)
Hemoglobin: 10.1 g/dL — ABNORMAL LOW (ref 12.0–15.0)
Immature Granulocytes: 1 %
Lymphocytes Relative: 2 %
Lymphs Abs: 0.2 10*3/uL — ABNORMAL LOW (ref 0.7–4.0)
MCH: 32.3 pg (ref 26.0–34.0)
MCHC: 33.9 g/dL (ref 30.0–36.0)
MCV: 95.2 fL (ref 80.0–100.0)
Monocytes Absolute: 0.3 10*3/uL (ref 0.1–1.0)
Monocytes Relative: 4 %
Neutro Abs: 8 10*3/uL — ABNORMAL HIGH (ref 1.7–7.7)
Neutrophils Relative %: 93 %
Platelets: 218 10*3/uL (ref 150–400)
RBC: 3.13 MIL/uL — ABNORMAL LOW (ref 3.87–5.11)
RDW: 15.7 % — ABNORMAL HIGH (ref 11.5–15.5)
WBC: 8.6 10*3/uL (ref 4.0–10.5)
nRBC: 0.2 % (ref 0.0–0.2)

## 2021-08-06 LAB — C-REACTIVE PROTEIN: CRP: 9 mg/dL — ABNORMAL HIGH (ref <1.0)

## 2021-08-06 LAB — GLUCOSE, CAPILLARY
Glucose-Capillary: 305 mg/dL — ABNORMAL HIGH (ref 70–99)
Glucose-Capillary: 361 mg/dL — ABNORMAL HIGH (ref 70–99)
Glucose-Capillary: 354 mg/dL — ABNORMAL HIGH (ref 70–99)
Glucose-Capillary: 258 mg/dL — ABNORMAL HIGH (ref 70–99)

## 2021-08-06 LAB — D-DIMER, QUANTITATIVE: D-Dimer, Quant: 1.69 ug/mL-FEU — ABNORMAL HIGH (ref 0.00–0.50)

## 2021-08-06 LAB — HIV ANTIBODY (ROUTINE TESTING W REFLEX): HIV Screen 4th Generation wRfx: NONREACTIVE

## 2021-08-06 LAB — LEGIONELLA PNEUMOPHILA SEROGP 1 UR AG: L. pneumophila Serogp 1 Ur Ag: NEGATIVE

## 2021-08-06 LAB — MAGNESIUM: Magnesium: 1.9 mg/dL (ref 1.7–2.4)

## 2021-08-06 MED ORDER — GLUCERNA SHAKE PO LIQD
237.0000 mL | ORAL | Status: DC
  Administered 2021-08-06 – 2021-08-12 (×7): 237 mL via ORAL
  Filled 2021-08-06 (×7): qty 237

## 2021-08-06 MED ORDER — GUAIFENESIN-DM 100-10 MG/5ML PO SYRP
10.0000 mL | ORAL_SOLUTION | Freq: Four times a day (QID) | ORAL | Status: DC
  Administered 2021-08-06 – 2021-08-25 (×74): 10 mL via ORAL
  Filled 2021-08-06 (×77): qty 10

## 2021-08-06 MED ORDER — PROSOURCE PLUS PO LIQD
30.0000 mL | Freq: Two times a day (BID) | ORAL | Status: DC
  Administered 2021-08-06 – 2021-08-23 (×29): 30 mL via ORAL
  Filled 2021-08-06 (×34): qty 30

## 2021-08-06 MED ORDER — FLUTICASONE PROPIONATE 50 MCG/ACT NA SUSP
2.0000 | Freq: Every day | NASAL | Status: DC
  Administered 2021-08-06 – 2021-08-25 (×20): 2 via NASAL
  Filled 2021-08-06: qty 16

## 2021-08-06 MED ORDER — ENSURE ENLIVE PO LIQD
237.0000 mL | Freq: Three times a day (TID) | ORAL | Status: DC
  Administered 2021-08-06: 10:00:00 237 mL via ORAL

## 2021-08-06 MED ORDER — ENSURE MAX PROTEIN PO LIQD
11.0000 [oz_av] | Freq: Every day | ORAL | Status: DC
  Administered 2021-08-08 – 2021-08-25 (×16): 11 [oz_av] via ORAL
  Filled 2021-08-06 (×20): qty 330

## 2021-08-06 MED ORDER — ADULT MULTIVITAMIN W/MINERALS CH
1.0000 | ORAL_TABLET | Freq: Every day | ORAL | Status: DC
  Administered 2021-08-06 – 2021-08-25 (×20): 1 via ORAL
  Filled 2021-08-06 (×20): qty 1

## 2021-08-06 MED ORDER — INSULIN GLARGINE-YFGN 100 UNIT/ML ~~LOC~~ SOLN
16.0000 [IU] | Freq: Every day | SUBCUTANEOUS | Status: DC
Start: 2021-08-06 — End: 2021-08-08
  Administered 2021-08-06 – 2021-08-07 (×2): 16 [IU] via SUBCUTANEOUS
  Filled 2021-08-06 (×3): qty 0.16

## 2021-08-06 NOTE — Progress Notes (Signed)
Initial Nutrition Assessment  DOCUMENTATION CODES:   Obesity unspecified  INTERVENTION:  - will order Ensure Max once/day, each supplement provides 150 kcal and 30 grams of protein. - will order Glucerna Shake once/day, each supplement provides 220 kcal and 10 grams of protein. - will order 30 ml Prosource Plus BID, each supplement provides 100 kcal and 15 grams protein.  - will order 1 tablet multivitamin with minerals/day.    NUTRITION DIAGNOSIS:   Increased nutrient needs related to acute illness, catabolic illness (RXVQM-08 infection) as evidenced by estimated needs.  GOAL:   Patient will meet greater than or equal to 90% of their needs  MONITOR:   PO intake, Supplement acceptance, Labs, Weight trends  REASON FOR ASSESSMENT:   Malnutrition Screening Tool  ASSESSMENT:   65 y.o. 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 HTN, OSA, PAD, and afib. She presented to the ED with mild confusion, progressively worsening weakness, worsening respiratory failure with hypoxia (84% sats requiring 6L O2) low-grade fever, chills, fatigue, malaise, headache, sore throat, rhinorrhea, dry cough, and several episodes of loose stools for several days.  Patient is noted to be out of the room to CT. The only documented meal percentages since admission were 50% of breakfast and 75% of lunch yesterday. Will add oral nutrition supplements as outlined above.  She has not been seen by a  RD at any time in the past.   Weight yesterday was 196 lb and weight on 05/18/21 was documented as 211 lb. This indicates 15 lb weight loss (7% body weight) in the past 2 months; significant for time  frame.    Labs reviewed; HgbA1c: 6.9%, CBGs: 305 and 361 mg/dl, Na: 131 mmol/l, Cl: 96 mmol/l, Ca: 8.6 mg/dl.  Medications reviewed; 500 mg ascorbic acid/day, sliding scale novolog, 22 units novolog x1 dose 12/20, 16 units semglee/day started 12/22, 5 mg tradjenta/day, 100 mg solu-medrol BID, 40 mg oral protonix/day, 100 mg IV remdesivir x1 dose/day 12/21-12/24, 220 mg zinc sulfate/day.    NUTRITION - FOCUSED PHYSICAL EXAM:  Unable to complete at this time.  Diet Order:   Diet Order             Diet heart healthy/carb modified Room service appropriate? Yes; Fluid consistency: Thin  Diet effective now                   EDUCATION NEEDS:   Not appropriate for education at this time  Skin:  Skin Assessment: Skin Integrity Issues: Skin Integrity Issues:: Stage II Stage II: rectum  Last BM:  PTA/unknown  Height:   Ht Readings from Last 1 Encounters:  08/05/21 5\' 1"  (1.549 m)    Weight:   Wt Readings from Last 1 Encounters:  08/05/21 88.9 kg     Estimated Nutritional Needs:  Kcal:  1800-2000 kcal Protein:  90-105 grams Fluid:  >/= 2 L/day     Jarome Matin, MS, RD, LDN, CNSC Inpatient Clinical Dietitian RD pager # available in AMION  After hours/weekend pager # available in [facility]

## 2021-08-06 NOTE — Hospital Course (Addendum)
This is Katherine [name] 65 year old female with anxiety, depression, CAD/CABG, chronic hypoxic respiratory failure related to COPD on 3 L of oxygen at home, type 2 diabetes mellitus, end-stage renal disease status post renal transplant, GERD, obesity, hypertension paroxysmal atrial fibrillation, PAD who presented to the hospital on 12/19 with shortness of breath and generalized weakness.  She was found to have [name] pulse ox of 84% on her usual oxygen, noted to be wheezing and had [name] cough with yellow sputum.  COVID PCR noted to be positive.  She's been admitted for COVID 19 pneumonia and now found to have MRSA pneumonia.  She's had progressively worsening O2 needs, now on heated high flow.    See below for additional details

## 2021-08-06 NOTE — Progress Notes (Signed)
[facility] Progress Note  Patient: Katherine [name]     MWN:[id number]  DOA: 08/04/2021      Date of Service: the patient was seen and examined on 08/06/2021  Brief hospital course: This is a 65 year old female with anxiety, depression, CAD/CABG, chronic hypoxic respiratory failure related to COPD on 3 L of oxygen at home, type 2 diabetes mellitus, end-stage renal disease status post renal transplant, GERD, obesity, hypertension paroxysmal atrial fibrillation, PAD who presented to the hospital on 12/19 with shortness of breath and generalized weakness.  She was found to have a pulse ox of 84% on her usual oxygen, noted to be wheezing and had a cough with yellow sputum.  COVID PCR noted to be positive.  Chest x-ray revealed bilateral atelectasis versus infiltrate and she was felt to have pneumonia. The patient was admitted and started on dexamethasone and remdesivir.  She was also given ceftriaxone  Subjective: she has a cough and dyspnea. Feels weak.   Assessment and Plan: * Acute on chronic respiratory failure with hypoxia (HCC) Uses 2 LPM at home. Worsened to require 12 LPM overnight. In the setting of COVID-19 infection with severe pneumonia. At risk for further worsening. Continue to monitor in the stepdown unit.  COVID-19 virus infection Severe acute COVID-19 Viral Pneumonia CXR: hazy bilateral peripheral opacities CT chest 08/05/2021: Very extensive heterogeneous groundglass airspace opacities throughout the lungs consistent with COVID-19 airspace disease-no PE Oxygen requirement: Now requiring 12 L of oxygen to keep pulse ox above 90%, still remains tachypneic CRP: 18> 9.0 Remdesivir: Started on 12/20. Steroids: Received IV Solu-Medrol in the ER, then was given Decadron.  Now back on IV Solu-Medrol 1 mg/kg twice daily. Antibiotics: ceftriaxone. Isolation duration: First positive test date 12/18.  Uncontrolled type 2 diabetes mellitus with hyperglycemia, with  long-term current use of insulin (HCC) with neuropathy Hyperglycemia in the setting of steroid use. Will be increasing Semglee to 16 U today Continue aggressive sliding scale insulin.  COPD (chronic obstructive pulmonary disease) ([place]) Placing the patient at high risk of poor outcome.  Continue inhalers.  Obesity (BMI 30-39.9) Body mass index is 37.03 kg/m.  Placing the patient at high risk of poor outcome.  Monitor.  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 Currently on prednisone and Imuran. Placing the patient at high risk for poor outcome secondary to chronic immunosuppressive status  OSA (obstructive sleep apnea) Does not use CPAP at night after lap band.  Long term (current) use of antithrombotics/antiplatelets Currently on Eliquis for A. fib.  We will continue.  Paroxysmal atrial fibrillation ([place]); CHA2DS2VASc score F, HTN, CAD, CVA = 5 Placing the patient at high is a poor outcome. Continue anticoagulation.  PAD (peripheral artery disease) ([place]) Prior history.  Monitor.  Hypomagnesemia Replaced.  Acute on chronic diastolic CHF (congestive heart failure) (HCC)-resolved as of 08/06/2021 Will treat with IV Lasix.  Monitor.  Use Lasix as needed.  CAD S/P percutaneous coronary angioplasty - PCI x 5 to SVG-D1 Prior history.  Monitor.  Pressure injury of skin Stage II, medial rectal ulcer. Present on admission. Continue dressing changes  GAD (generalized anxiety disorder) Continue Zoloft and Ativan.  Dyslipidemia, goal LDL below 70 Continue statin.    Body mass index is 37.03 kg/m.  Nutrition Problem: Increased nutrient needs Etiology: acute illness, catabolic illness (QIHKV-42 infection) Pressure Injury 08/04/21 Rectum Stage 2 -  Partial thickness loss of dermis presenting as a shallow open injury with a red, pink wound bed without slough. (Active)  08/04/21 1718  Location: Rectum  Location Orientation:   Staging: Stage 2 -   Partial thickness loss of dermis presenting as a shallow open injury with a red, pink wound bed without slough.  Wound Description (Comments):   Present on Admission: Yes     Time spent in minutes: 35 DVT prophylaxis: apixaban (ELIQUIS) tablet 5 mg  Code Status: Full code Level of Care: Level of care: Stepdown Disposition Plan:  Status is: Inpatient  Remains inpatient appropriate because: severe COVID pneumonia  Objective:   Vitals:   08/06/21 0839 08/06/21 1000 08/06/21 1118 08/06/21 1200  BP:   (!) 119/49 (!) 126/46  Pulse: (!) 45 66  63  Resp: (!) 28 (!) 32 (!) 24 (!) 27  Temp:    98.2 F (36.8 C)  TempSrc:    Oral  SpO2: 98% 94%  91%  Weight:      Height:       Filed Weights   08/04/21 0842 08/05/21 0417 08/05/21 2000  Weight: 108.9 kg 88.9 kg 88.9 kg    Exam: General exam: Appears comfortable  HEENT: PERRLA, oral mucosa moist, no sclera icterus or thrush Respiratory system: bilateral crackles Cardiovascular system: S1 & S2 heard, regular rate and rhythm Gastrointestinal system: Abdomen soft, non-tender, nondistended. Normal bowel sounds   Central nervous system: Alert and oriented. No focal neurological deficits. Extremities: No cyanosis, clubbing or edema Skin: No rashes or ulcers Psychiatry:  Mood & affect appropriate.     Imaging and lab data Reviewed    CBC: Recent Labs  Lab 08/04/21 0322 08/05/21 0252 08/06/21 0252  WBC 5.4 5.0 8.6  NEUTROABS 4.0 4.4 8.0*  HGB 12.0 11.3* 10.1*  HCT 37.7 34.6* 29.8*  MCV 100.0 97.2 95.2  PLT 197 177 856   Basic Metabolic Panel: Recent Labs  Lab 08/04/21 0322 08/04/21 0635 08/05/21 0252 08/06/21 0252  NA 132*  --  132* 131*  K 4.2  --  4.1 3.5  CL 99  --  101 96*  CO2 23  --  23 23  GLUCOSE 196*  --  176* 254*  BUN 19  --  15 19  CREATININE 0.99  --  0.77 0.92  CALCIUM 8.5*  --  8.5* 8.6*  MG  --  1.5* 2.2 1.9  PHOS  --  2.7 2.9  --    GFR: Estimated Creatinine Clearance: 62.6 mL/min (by C-G  formula based on SCr of 0.92 mg/dL).  Time spent: 35 minutes.   Author: Debbe Odea  08/06/2021 3:06 PM  To reach On-call, see care teams to locate the attending and reach out via [email or web address]. Between 7PM-7AM, please contact night-coverage If you still have difficulty reaching the attending provider, please page the [facility] (Director on Call) for [facility] on amion for assistance.

## 2021-08-06 NOTE — Progress Notes (Signed)
Inpatient Diabetes Program Recommendations  AACE/ADA: New Consensus Statement on Inpatient Glycemic Control (2015)  Target Ranges:  Prepandial:   less than 140 mg/dL      Peak postprandial:   less than 180 mg/dL (1-2 hours)      Critically ill patients:  140 - 180 mg/dL   Lab Results  Component Value Date   GLUCAP 305 (H) 08/06/2021   HGBA1C 6.9 (H) 05/12/2021    Review of Glycemic Control  Diabetes history: DM2 Outpatient Diabetes medications: Basaglar 12 units QAM, Novolog 4-10 units TID, Amaryl 4 mg QD  Current orders for Inpatient glycemic control: Semglee 16 units QD, Novolog -=15 units TID with meals and 0-5 HS, tradjenta 5 mg QD. Steroids: Solumedrol 100 mg Q12H  HgbA1C - 6.9% - good glycemic control PTA Would likely benefit from meal coverage addition while on steroids.  Inpatient Diabetes Program Recommendations:    Consider adding Novolog 4 units TID with meals if eating > 50%.  Will follow glucose trends.  Thank you. Lorenda Peck, RD, LDN, CDE Inpatient Diabetes Coordinator [phone]

## 2021-08-06 NOTE — Plan of Care (Signed)
°  Problem: Health Behavior/Discharge Planning: Goal: Ability to manage health-related needs will improve Outcome: Progressing   Problem: Activity: Goal: Risk for activity intolerance will decrease Outcome: Progressing   Problem: Coping: Goal: Level of anxiety will decrease Outcome: Progressing   Problem: Nutrition: Goal: Adequate nutrition will be maintained Outcome: Progressing

## 2021-08-06 NOTE — Progress Notes (Signed)
°  Transition of Care [facility]) Screening Note   Patient Details  Name: Katherine [name] Date of Birth: Oct 12, 1955   Transition of Care [facility]) CM/SW Contact:    Lennart Pall, LCSW Phone Number: 08/06/2021, 11:14 AM    Transition of Care Department [facility]) has reviewed patient and no TOC needs have been identified at this time. We will continue to monitor patient advancement through interdisciplinary progression rounds. If new patient transition needs arise, please place a TOC consult.   Redell Nazir, LCSW

## 2021-08-07 ENCOUNTER — Telehealth: Payer: Medicare Other

## 2021-08-07 ENCOUNTER — Inpatient Hospital Stay (HOSPITAL_COMMUNITY): Payer: Medicare Other

## 2021-08-07 DIAGNOSIS — J9621 Acute and chronic respiratory failure with hypoxia: Secondary | ICD-10-CM | POA: Diagnosis not present

## 2021-08-07 DIAGNOSIS — J449 Chronic obstructive pulmonary disease, unspecified: Secondary | ICD-10-CM | POA: Diagnosis not present

## 2021-08-07 DIAGNOSIS — I2510 Atherosclerotic heart disease of native coronary artery without angina pectoris: Secondary | ICD-10-CM | POA: Diagnosis not present

## 2021-08-07 DIAGNOSIS — I5032 Chronic diastolic (congestive) heart failure: Secondary | ICD-10-CM | POA: Diagnosis not present

## 2021-08-07 LAB — GLUCOSE, CAPILLARY
Glucose-Capillary: 313 mg/dL — ABNORMAL HIGH (ref 70–99)
Glucose-Capillary: 247 mg/dL — ABNORMAL HIGH (ref 70–99)
Glucose-Capillary: 329 mg/dL — ABNORMAL HIGH (ref 70–99)
Glucose-Capillary: 320 mg/dL — ABNORMAL HIGH (ref 70–99)

## 2021-08-07 LAB — CBC WITH DIFFERENTIAL/PLATELET
Abs Immature Granulocytes: 0.09 10*3/uL — ABNORMAL HIGH (ref 0.00–0.07)
Basophils Absolute: 0 10*3/uL (ref 0.0–0.1)
Basophils Relative: 0 %
Eosinophils Absolute: 0 10*3/uL (ref 0.0–0.5)
Eosinophils Relative: 0 %
HCT: 32.2 % — ABNORMAL LOW (ref 36.0–46.0)
Hemoglobin: 10.8 g/dL — ABNORMAL LOW (ref 12.0–15.0)
Immature Granulocytes: 1 %
Lymphocytes Relative: 2 %
Lymphs Abs: 0.2 10*3/uL — ABNORMAL LOW (ref 0.7–4.0)
MCH: 32 pg (ref 26.0–34.0)
MCHC: 33.5 g/dL (ref 30.0–36.0)
MCV: 95.3 fL (ref 80.0–100.0)
Monocytes Absolute: 0.5 10*3/uL (ref 0.1–1.0)
Monocytes Relative: 4 %
Neutro Abs: 10 10*3/uL — ABNORMAL HIGH (ref 1.7–7.7)
Neutrophils Relative %: 93 %
Platelets: 250 10*3/uL (ref 150–400)
RBC: 3.38 MIL/uL — ABNORMAL LOW (ref 3.87–5.11)
RDW: 15.6 % — ABNORMAL HIGH (ref 11.5–15.5)
WBC: 10.7 10*3/uL — ABNORMAL HIGH (ref 4.0–10.5)
nRBC: 0 % (ref 0.0–0.2)

## 2021-08-07 LAB — COMPREHENSIVE METABOLIC PANEL
ALT: 9 U/L (ref 0–44)
AST: 29 U/L (ref 15–41)
Albumin: 2.9 g/dL — ABNORMAL LOW (ref 3.5–5.0)
Alkaline Phosphatase: 111 U/L (ref 38–126)
Anion gap: 10 (ref 5–15)
BUN: 21 mg/dL (ref 8–23)
CO2: 25 mmol/L (ref 22–32)
Calcium: 8.9 mg/dL (ref 8.9–10.3)
Chloride: 96 mmol/L — ABNORMAL LOW (ref 98–111)
Creatinine, Ser: 0.91 mg/dL (ref 0.44–1.00)
GFR, Estimated: >60 mL/min (ref >60)
Glucose, Bld: 281 mg/dL — ABNORMAL HIGH (ref 70–99)
Potassium: 4 mmol/L (ref 3.5–5.1)
Sodium: 131 mmol/L — ABNORMAL LOW (ref 135–145)
Total Bilirubin: 0.7 mg/dL (ref 0.3–1.2)
Total Protein: 6.8 g/dL (ref 6.5–8.1)

## 2021-08-07 LAB — MAGNESIUM: Magnesium: 2.1 mg/dL (ref 1.7–2.4)

## 2021-08-07 LAB — D-DIMER, QUANTITATIVE: D-Dimer, Quant: 1.69 ug/mL-FEU — ABNORMAL HIGH (ref 0.00–0.50)

## 2021-08-07 LAB — C-REACTIVE PROTEIN: CRP: 5.6 mg/dL — ABNORMAL HIGH (ref <1.0)

## 2021-08-07 MED ORDER — MAGIC MOUTHWASH
5.0000 mL | Freq: Four times a day (QID) | ORAL | Status: DC
Start: 2021-08-07 — End: 2021-08-25
  Administered 2021-08-07 – 2021-08-25 (×61): 5 mL via ORAL
  Filled 2021-08-07 (×62): qty 5

## 2021-08-07 MED ORDER — ATROPINE SULFATE 1 MG/10ML IJ SOSY
PREFILLED_SYRINGE | INTRAMUSCULAR | Status: AC
  Filled 2021-08-07: qty 10

## 2021-08-07 NOTE — Progress Notes (Signed)
Inpatient Diabetes Program Recommendations  AACE/ADA: New Consensus Statement on Inpatient Glycemic Control (2015)  Target Ranges:  Prepandial:   less than 140 mg/dL      Peak postprandial:   less than 180 mg/dL (1-2 hours)      Critically ill patients:  140 - 180 mg/dL   Lab Results  Component Value Date   GLUCAP 247 (H) 08/07/2021   HGBA1C 6.9 (H) 05/12/2021    Review of Glycemic Control  Latest Reference Range & Units 08/05/21 21:23 08/06/21 08:10 08/06/21 11:13 08/06/21 16:41 08/06/21 21:11 08/07/21 07:25 08/07/21 11:08  Glucose-Capillary 70 - 99 mg/dL 346 (H) 305 (H) 361 (H) 354 (H) 258 (H) 313 (H) 247 (H)   Diabetes history: DM 2 Outpatient Diabetes medications:  Basaglar 12 units q AM, Novolog 4-10 units tid with meals, Amaryl 4 mg daily, Metformin 500 mg bid Current orders for Inpatient glycemic control:  Tradjenta 5 mg daily Novolog moderate tid with meals and HS Semglee 16 units daily Solumedrol 100 mg bid  Inpatient Diabetes Program Recommendations:    Please increase Semglee to 22 units daily and add Novolog meal coverage 4 units tid with meals (hold if patient eats less than 50% or NPO).   Thanks,  Adah Perl, RN, BC-ADM Inpatient Diabetes Coordinator Pager [phone]  (8a-5p)

## 2021-08-07 NOTE — Assessment & Plan Note (Signed)
Grade 1 diastolic dysfunction

## 2021-08-07 NOTE — Progress Notes (Addendum)
[facility] Progress Note  Patient: Katherine [name]     BSJ:[id number]  DOA: 08/04/2021      Date of Service: the patient was seen and examined on 08/07/2021  Brief hospital course: This is a 65 year old female with anxiety, depression, CAD/CABG, chronic hypoxic respiratory failure related to COPD on 3 L of oxygen at home, type 2 diabetes mellitus, end-stage renal disease status post renal transplant, GERD, obesity, hypertension paroxysmal atrial fibrillation, PAD who presented to the hospital on 12/19 with shortness of breath and generalized weakness.  She was found to have a pulse ox of 84% on her usual oxygen, noted to be wheezing and had a cough with yellow sputum.  COVID PCR noted to be positive.  Chest x-ray revealed bilateral atelectasis versus infiltrate and she was felt to have pneumonia. The patient was admitted and started on dexamethasone and remdesivir.  She was also given ceftriaxone  Subjective:  Has sores in her mouth today.   Assessment and Plan: * Acute on chronic respiratory failure with hypoxia ([place])- (present on admission)  Due to COVID-19 infection with severe pneumonia in setting of COPD COVID test + on 12/18 CXR: hazy bilateral peripheral opacities CT chest 08/05/2021: Very extensive heterogeneous groundglass airspace opacities throughout the lungs consistent with COVID-19 airspace disease-no PE Uses 2 LPM at home. Pulse ox in 70s on room air today Despite Remdesivir, Ceftriaxone and IV steroids, she has had progressive hypoxia and now on 14 LPM  She does not appear fluid overloaded Flutter valve and incentive spirometry ordered CRP: 18> 9.0> 5/6  Sores in mouth - Dukes mouthwash  Uncontrolled type 2 diabetes mellitus with hyperglycemia, 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 Hyperglycemia in the setting of steroid use. Will be increasing Semglee from 16 to 22 U today Continue aggressive sliding scale insulin. Holding  Metformin and Amaryl  Chronic diastolic CHF (congestive heart failure) ([place])- (present on admission) Grade 1 diastolic dysfunction  Obesity (BMI 30-39.9)- (present on admission) Body mass index is 37.03 kg/m.  Placing the patient at high risk of poor outcome.  Monitor.  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 Receives prednisone and Imuran. Placing the patient at high risk for poor outcome secondary to chronic immunosuppressive status  OSA (obstructive sleep apnea)- (present on admission) Does not use CPAP at night after lap band.  Paroxysmal atrial fibrillation (HCC); CHA2DS2VASc score F, HTN, CAD, CVA = 5- (present on admission) Continue Metoprolol and Eliquis  PAD (peripheral artery disease) ([place])- (present on admission) Prior history.  Monitor.  Hypomagnesemia- (present on admission) Replaced.  CAD S/P percutaneous coronary angioplasty - PCI x 5 to SVG-D1 Prior history.  Monitor.  Pressure injury of skin- (present on admission) Stage II, medial rectal ulcer. Present on admission. Continue dressing changes  GAD (generalized anxiety disorder)- (present on admission) Continue Zoloft and Ativan.  Dyslipidemia, goal LDL below 70- (present on admission) Continue statin.     Nutrition Problem: Increased nutrient needs Etiology: acute illness, catabolic illness (TMLYY-50 infection) Pressure Injury 08/04/21 Rectum Stage 2 -  Partial thickness loss of dermis presenting as a shallow open injury with a red, pink wound bed without slough. (Active)  08/04/21 1718  Location: Rectum  Location Orientation:   Staging: Stage 2 -  Partial thickness loss of dermis presenting as a shallow open injury with a red, pink wound bed without slough.  Wound Description (Comments):   Present on Admission: Yes     Time spent in minutes: 35 DVT prophylaxis:  apixaban (ELIQUIS) tablet 5 mg  Code Status:   Code Status: Full Code  Level of Care: Level of care:  Stepdown Disposition Plan:  Status is: Inpatient  Remains inpatient appropriate because: acute respiratory failure  Objective:   Vitals:   08/07/21 0800 08/07/21 0813 08/07/21 1130 08/07/21 1200  BP: (!) 176/70  (!) 170/66 (!) 172/74  Pulse:  76 69 61  Resp: 17 (!) 25 (!) 24 (!) 30  Temp: 97.7 F (36.5 C)   98.5 F (36.9 C)  TempSrc: Oral   Oral  SpO2: (!) 83% 92% (!) 87% (!) 89%  Weight:      Height:       Filed Weights   08/05/21 0417 08/05/21 2000 08/07/21 0455  Weight: 88.9 kg 88.9 kg 92.9 kg    Exam: General exam: Appears comfortable  HEENT: PERRLA, oral mucosa moist, no sclera icterus or thrush Respiratory system:b/l crackles pulse ox 75% on room air. Cardiovascular system: S1 & S2 heard, regular rate and rhythm Gastrointestinal system: Abdomen soft, non-tender, nondistended. Normal bowel sounds   Central nervous system: Alert and oriented. No focal neurological deficits. Extremities: No cyanosis, clubbing or edema Skin: No rashes or ulcers Psychiatry:  Mood & affect appropriate.   Imaging and lab data Reviewed    CBC: Recent Labs  Lab 08/04/21 0322 08/05/21 0252 08/06/21 0252 08/07/21 0254  WBC 5.4 5.0 8.6 10.7*  NEUTROABS 4.0 4.4 8.0* 10.0*  HGB 12.0 11.3* 10.1* 10.8*  HCT 37.7 34.6* 29.8* 32.2*  MCV 100.0 97.2 95.2 95.3  PLT 197 177 218 883   Basic Metabolic Panel: Recent Labs  Lab 08/04/21 0322 08/04/21 0635 08/05/21 0252 08/06/21 0252 08/07/21 0254  NA 132*  --  132* 131* 131*  K 4.2  --  4.1 3.5 4.0  CL 99  --  101 96* 96*  CO2 23  --  23 23 25   GLUCOSE 196*  --  176* 254* 281*  BUN 19  --  15 19 21   CREATININE 0.99  --  0.77 0.92 0.91  CALCIUM 8.5*  --  8.5* 8.6* 8.9  MG  --  1.5* 2.2 1.9 2.1  PHOS  --  2.7 2.9  --   --    GFR: Estimated Creatinine Clearance: 64.9 mL/min (by C-G formula based on SCr of 0.91 mg/dL).  Time spent: 35 minutes.   Author: Debbe Odea  08/07/2021 2:10 PM

## 2021-08-07 NOTE — Evaluation (Signed)
Physical Therapy Evaluation Patient Details Name: Katherine [name] MRN: [id number] DOB: 19-Apr-1956 Today's Date: 08/07/2021  History of Present Illness  Pt admitted from home 2* SOB with COVID PNA and with hx of CAD, CABG, PAD, PAF, COPD, Fibromyalgia, R eye CVA with residual blindness, kidney transplant and obesity  Clinical Impression  Pt admitted as above and presenting with functional mobility limitations 2* generalized weakness and decreased endurance.  This date, pt limited to bed level eval 2* c/o fatigue but she hopes to progress to dc home with family assist.     Recommendations for follow up therapy are one component of a multi-disciplinary discharge planning process, led by the attending physician.  Recommendations may be updated based on patient status, additional functional criteria and insurance authorization.  Follow Up Recommendations Other (comment) (To be determined upon pt participation with more complete assessment)    Assistance Recommended at Discharge Frequent or constant Supervision/Assistance  Functional Status Assessment Patient has had a recent decline in their functional status and demonstrates the ability to make significant improvements in function in a reasonable and predictable amount of time.  Equipment Recommendations  None recommended by PT    Recommendations for Other Services       Precautions / Restrictions Precautions Precautions: Fall Precaution Comments: monitor sats Restrictions Weight Bearing Restrictions: No      Mobility  Bed Mobility               General bed mobility comments: Pt declines attempts to mobilize OOB c/o fatigue.    Transfers                        Ambulation/Gait                  Stairs            Wheelchair Mobility    Modified Rankin (Stroke Patients Only)       Balance                                             Pertinent Vitals/Pain Pain  Assessment: No/denies pain    Home Living Family/patient expects to be discharged to:: Private residence Living Arrangements: Other relatives Available Help at Discharge: Family;Available 24 hours/day Type of Home: House Home Access: Stairs to enter Entrance Stairs-Rails: Chemical engineer of Steps: 5   Home Layout: One level Home Equipment: Rollator (4 wheels);Cane - single point;Tub bench;Grab bars - toilet;Wheelchair - manual Additional Comments: wears 3L of supplemental O2 at home    Prior Function Prior Level of Function : Independent/Modified Independent                     Hand Dominance   Dominant Hand: Right    Extremity/Trunk Assessment   Upper Extremity Assessment Upper Extremity Assessment: Generalized weakness    Lower Extremity Assessment Lower Extremity Assessment: LLE deficits/detail;Generalized weakness LLE Deficits / Details: Limited flexion L knee - pt states this has been long term       Communication   Communication: No difficulties  Cognition Arousal/Alertness: Awake/alert Behavior During Therapy: WFL for tasks assessed/performed;Flat affect Overall Cognitive Status: Within Functional Limits for tasks assessed  General Comments      Exercises     Assessment/Plan    PT Assessment Patient needs continued PT services  PT Problem List Decreased range of motion;Decreased activity tolerance;Decreased balance;Decreased mobility;Decreased knowledge of use of DME;Obesity       PT Treatment Interventions DME instruction;Gait training;Stair training;Functional mobility training;Therapeutic activities;Therapeutic exercise;Balance training;Patient/family education    PT Goals (Current goals can be found in the Care Plan section)  Acute Rehab PT Goals Patient Stated Goal: HOME PT Goal Formulation: With patient Time For Goal Achievement: 08/20/21 Potential to Achieve  Goals: Good    Frequency Min 3X/week   Barriers to discharge        Co-evaluation               AM-PAC PT "6 Clicks" Mobility  Outcome Measure Help needed turning from your back to your side while in a flat bed without using bedrails?: A Lot Help needed moving from lying on your back to sitting on the side of a flat bed without using bedrails?: A Lot Help needed moving to and from a bed to a chair (including a wheelchair)?: A Lot Help needed standing up from a chair using your arms (e.g., wheelchair or bedside chair)?: A Lot Help needed to walk in hospital room?: Total Help needed climbing 3-5 steps with a railing? : Total 6 Click Score: 10    End of Session   Activity Tolerance: Patient limited by fatigue Patient left: in bed;with call bell/phone within reach;with bed alarm set Nurse Communication: Mobility status PT Visit Diagnosis: Difficulty in walking, not elsewhere classified (R26.2)    Time: 6060-0459 PT Time Calculation (min) (ACUTE ONLY): 17 min   Charges:   PT Evaluation $PT Eval Low Complexity: [place] Pager [phone] Office [phone]   Jadd Gasior 08/07/2021, 4:38 PM

## 2021-08-07 NOTE — Progress Notes (Signed)
Called to the patient's room regarding desaturations and possible Heated High Flow Nasal Cannula requirement.  Options discussed with Pt and decision made to try a [place] High Flow nasal cannula and Nonrebreather combination prior to starting heated high flow nasal cannula.  Pt stable and comfortable at this time with SpO2 of 98% at this time.

## 2021-08-07 NOTE — Plan of Care (Signed)
  Problem: Education: Goal: Knowledge of risk factors and measures for prevention of condition will improve Outcome: Progressing   Problem: Coping: Goal: Psychosocial and spiritual needs will be supported Outcome: Progressing   Problem: Respiratory: Goal: Will maintain a patent airway Outcome: Progressing   Problem: Respiratory: Goal: Complications related to the disease process, condition or treatment will be avoided or minimized Outcome: Not Progressing   

## 2021-08-08 DIAGNOSIS — J9621 Acute and chronic respiratory failure with hypoxia: Secondary | ICD-10-CM | POA: Diagnosis not present

## 2021-08-08 DIAGNOSIS — J449 Chronic obstructive pulmonary disease, unspecified: Secondary | ICD-10-CM | POA: Diagnosis not present

## 2021-08-08 DIAGNOSIS — I2510 Atherosclerotic heart disease of native coronary artery without angina pectoris: Secondary | ICD-10-CM | POA: Diagnosis not present

## 2021-08-08 DIAGNOSIS — I5032 Chronic diastolic (congestive) heart failure: Secondary | ICD-10-CM | POA: Diagnosis not present

## 2021-08-08 LAB — COMPREHENSIVE METABOLIC PANEL
ALT: 9 U/L (ref 0–44)
AST: 17 U/L (ref 15–41)
Albumin: 2.7 g/dL — ABNORMAL LOW (ref 3.5–5.0)
Alkaline Phosphatase: 112 U/L (ref 38–126)
Anion gap: 10 (ref 5–15)
BUN: 26 mg/dL — ABNORMAL HIGH (ref 8–23)
CO2: 27 mmol/L (ref 22–32)
Calcium: 9 mg/dL (ref 8.9–10.3)
Chloride: 100 mmol/L (ref 98–111)
Creatinine, Ser: 0.74 mg/dL (ref 0.44–1.00)
GFR, Estimated: >60 mL/min (ref >60)
Glucose, Bld: 255 mg/dL — ABNORMAL HIGH (ref 70–99)
Potassium: 3.9 mmol/L (ref 3.5–5.1)
Sodium: 137 mmol/L (ref 135–145)
Total Bilirubin: 0.5 mg/dL (ref 0.3–1.2)
Total Protein: 6.6 g/dL (ref 6.5–8.1)

## 2021-08-08 LAB — GLUCOSE, CAPILLARY
Glucose-Capillary: 329 mg/dL — ABNORMAL HIGH (ref 70–99)
Glucose-Capillary: 368 mg/dL — ABNORMAL HIGH (ref 70–99)
Glucose-Capillary: 301 mg/dL — ABNORMAL HIGH (ref 70–99)
Glucose-Capillary: 207 mg/dL — ABNORMAL HIGH (ref 70–99)

## 2021-08-08 LAB — CBC WITH DIFFERENTIAL/PLATELET
Abs Immature Granulocytes: 0.11 10*3/uL — ABNORMAL HIGH (ref 0.00–0.07)
Basophils Absolute: 0 10*3/uL (ref 0.0–0.1)
Basophils Relative: 0 %
Eosinophils Absolute: 0 10*3/uL (ref 0.0–0.5)
Eosinophils Relative: 0 %
HCT: 32.1 % — ABNORMAL LOW (ref 36.0–46.0)
Hemoglobin: 10.7 g/dL — ABNORMAL LOW (ref 12.0–15.0)
Immature Granulocytes: 1 %
Lymphocytes Relative: 2 %
Lymphs Abs: 0.2 10*3/uL — ABNORMAL LOW (ref 0.7–4.0)
MCH: 31.9 pg (ref 26.0–34.0)
MCHC: 33.3 g/dL (ref 30.0–36.0)
MCV: 95.8 fL (ref 80.0–100.0)
Monocytes Absolute: 0.4 10*3/uL (ref 0.1–1.0)
Monocytes Relative: 4 %
Neutro Abs: 10.4 10*3/uL — ABNORMAL HIGH (ref 1.7–7.7)
Neutrophils Relative %: 93 %
Platelets: 282 10*3/uL (ref 150–400)
RBC: 3.35 MIL/uL — ABNORMAL LOW (ref 3.87–5.11)
RDW: 15.2 % (ref 11.5–15.5)
WBC: 11.1 10*3/uL — ABNORMAL HIGH (ref 4.0–10.5)
nRBC: 0.2 % (ref 0.0–0.2)

## 2021-08-08 LAB — CULTURE, RESPIRATORY W GRAM STAIN

## 2021-08-08 LAB — D-DIMER, QUANTITATIVE: D-Dimer, Quant: 1.84 ug/mL-FEU — ABNORMAL HIGH (ref 0.00–0.50)

## 2021-08-08 LAB — MAGNESIUM: Magnesium: 2.1 mg/dL (ref 1.7–2.4)

## 2021-08-08 LAB — C-REACTIVE PROTEIN: CRP: 3.9 mg/dL — ABNORMAL HIGH (ref <1.0)

## 2021-08-08 MED ORDER — SALINE SPRAY 0.65 % NA SOLN
1.0000 | NASAL | Status: DC | PRN
  Administered 2021-08-10 – 2021-08-13 (×3): 1 via NASAL
  Filled 2021-08-08: qty 44

## 2021-08-08 MED ORDER — SALINE SPRAY 0.65 % NA SOLN
1.0000 | NASAL | Status: DC | PRN
  Filled 2021-08-08: qty 44

## 2021-08-08 MED ORDER — INSULIN GLARGINE-YFGN 100 UNIT/ML ~~LOC~~ SOLN
20.0000 [IU] | Freq: Every day | SUBCUTANEOUS | Status: DC
  Administered 2021-08-08 – 2021-08-11 (×4): 20 [IU] via SUBCUTANEOUS
  Filled 2021-08-08 (×4): qty 0.2

## 2021-08-08 MED ORDER — INSULIN ASPART 100 UNIT/ML IJ SOLN
4.0000 [IU] | Freq: Three times a day (TID) | INTRAMUSCULAR | Status: DC
Start: 2021-08-08 — End: 2021-08-11
  Administered 2021-08-08 – 2021-08-11 (×7): 4 [IU] via SUBCUTANEOUS

## 2021-08-08 MED ORDER — SODIUM CHLORIDE 0.9 % IV SOLN
100.0000 mg | Freq: Two times a day (BID) | INTRAVENOUS | Status: AC
  Administered 2021-08-08 – 2021-08-15 (×14): 100 mg via INTRAVENOUS
  Filled 2021-08-08 (×14): qty 100

## 2021-08-08 NOTE — Progress Notes (Signed)
[facility] Progress Note  Patient: Katherine [name]     ZOX:[id number]  DOA: 08/04/2021      Date of Service: the patient was seen and examined on 08/08/2021  Brief hospital course: This is a 65 year old female with anxiety, depression, CAD/CABG, chronic hypoxic respiratory failure related to COPD on 3 L of oxygen at home, type 2 diabetes mellitus, end-stage renal disease status post renal transplant, GERD, obesity, hypertension paroxysmal atrial fibrillation, PAD who presented to the hospital on 12/19 with shortness of breath and generalized weakness.  She was found to have a pulse ox of 84% on her usual oxygen, noted to be wheezing and had a cough with yellow sputum.  COVID PCR noted to be positive.  Chest x-ray revealed bilateral atelectasis versus infiltrate and she was felt to have pneumonia. The patient was admitted and started on dexamethasone and remdesivir.  She was also given ceftriaxone  Subjective: Still quite weak today.   Assessment and Plan: * Acute on chronic respiratory failure with hypoxia ([place])- (present on admission) Uses 2 LPM at home. Due to COVID-19 infection with severe pneumonia in setting of COPD COVID test + on 12/18 CXRon admission: hazy bilateral peripheral opacities CT chest 08/05/2021: Very extensive heterogeneous groundglass airspace opacities throughout the lungs consistent with COVID-19 airspace disease-no PE Despite Remdesivir, Ceftriaxone and IV steroids, she has had progressive hypoxia and now on 15 LPM  She does not appear fluid overloaded Flutter valve and incentive spirometry ordered CRP: 18> 9.0> 5.6> 3.9 - 12/23> MRSA + sputum culture- starting Doxy- Zyvox will interact with Zoloft so will avoid Zyvox- discussed with pharmacy today CXR today > Patchy airspace opacities improved in the interval from the prior study  Uncontrolled type 2 diabetes mellitus with hyperglycemia, with long-term current use of insulin (HCC) with  neuropathy Holding Metformin and Amaryl Hyperglycemia in the setting of steroid use. Will be increasing Semglee from 16 to 20 U today Continue sliding scale insulin with meals- She ate 100% of breakfast today- will add mealtime insulin 4 U   Chronic diastolic CHF (congestive heart failure) ([place])- (present on admission) Grade 1 diastolic dysfunction compensated  Obesity (BMI 30-39.9)- (present on admission) Body mass index is 37.03 kg/m.  Placing the patient at high risk of poor outcome.  Monitor.  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 Receives prednisone and Imuran. Placing the patient at high risk for poor outcome secondary to chronic immunosuppressive status  OSA (obstructive sleep apnea)- (present on admission) Does not use CPAP at night after lap band.  Paroxysmal atrial fibrillation (HCC); CHA2DS2VASc score F, HTN, CAD, CVA = 5- (present on admission) Continue Metoprolol and Eliquis  PAD (peripheral artery disease) ([place])- (present on admission) Prior history.  Monitor.  Hypomagnesemia- (present on admission) Replaced.  CAD S/P percutaneous coronary angioplasty - PCI x 5 to SVG-D1 Prior history.  Monitor.  Pressure injury of skin- (present on admission) Stage II, medial rectal ulcer. Present on admission. Continue dressing changes  GAD (generalized anxiety disorder)- (present on admission) Continue Zoloft and Ativan.  Dyslipidemia, goal LDL below 70- (present on admission) Continue statin.    Body mass index is 39.11 kg/m.  Nutrition Problem: Increased nutrient needs Etiology: acute illness, catabolic illness (WJXBJ-47 infection) Pressure Injury 08/04/21 Rectum Stage 2 -  Partial thickness loss of dermis presenting as a shallow open injury with a red, pink wound bed without slough. (Active)  08/04/21 1718  Location: Rectum  Location Orientation:   Staging: Stage 2 -  Partial  thickness loss of dermis presenting as a shallow open  injury with a red, pink wound bed without slough.  Wound Description (Comments):   Present on Admission: Yes     Time spent in minutes: 35 DVT prophylaxis: apixaban (ELIQUIS) tablet 5 mg  Code Status: Full code Level of Care: Level of care: Stepdown Disposition Plan:  Status is: Inpatient  Remains inpatient appropriate because: severe COVID pneumonia and MRSA pneumonia  Objective:   Vitals:   08/08/21 1000 08/08/21 1200 08/08/21 1240 08/08/21 1400  BP:  (!) 151/58    Pulse: 80 64  (!) 55  Resp: (!) 26 19  (!) 28  Temp:   97.6 F (36.4 C)   TempSrc:   Oral   SpO2: (!) 86% (!) 89%  100%  Weight:      Height:       Filed Weights   08/05/21 2000 08/07/21 0455 08/08/21 0500  Weight: 88.9 kg 92.9 kg 93.9 kg    Exam: General exam: Appears comfortable  HEENT: PERRLA, oral mucosa moist, no sclera icterus or thrush Respiratory system: b/l crackles  Cardiovascular system: S1 & S2 heard, regular rate and rhythm Gastrointestinal system: Abdomen soft, non-tender, nondistended. Normal bowel sounds   Central nervous system: Alert and oriented. No focal neurological deficits. Extremities: No cyanosis, clubbing or edema Skin: No rashes or ulcers Psychiatry:  Mood & affect appropriate.      Imaging and lab data Reviewed    CBC: Recent Labs  Lab 08/04/21 0322 08/05/21 0252 08/06/21 0252 08/07/21 0254 08/08/21 0256  WBC 5.4 5.0 8.6 10.7* 11.1*  NEUTROABS 4.0 4.4 8.0* 10.0* 10.4*  HGB 12.0 11.3* 10.1* 10.8* 10.7*  HCT 37.7 34.6* 29.8* 32.2* 32.1*  MCV 100.0 97.2 95.2 95.3 95.8  PLT 197 177 218 250 409    Basic Metabolic Panel: Recent Labs  Lab 08/04/21 0322 08/04/21 0635 08/05/21 0252 08/06/21 0252 08/07/21 0254 08/08/21 0256  NA 132*  --  132* 131* 131* 137  K 4.2  --  4.1 3.5 4.0 3.9  CL 99  --  101 96* 96* 100  CO2 23  --  23 23 25 27   GLUCOSE 196*  --  176* 254* 281* 255*  BUN 19  --  15 19 21  26*  CREATININE 0.99  --  0.77 0.92 0.91 0.74  CALCIUM 8.5*  --   8.5* 8.6* 8.9 9.0  MG  --  1.5* 2.2 1.9 2.1 2.1  PHOS  --  2.7 2.9  --   --   --     GFR: Estimated Creatinine Clearance: 74.2 mL/min (by C-G formula based on SCr of 0.74 mg/dL).  Time spent: 35 minutes.   Author: Debbe Odea  08/08/2021 3:00 PM  To reach On-call, see care teams to locate the attending and reach out via [email or web address]. Between 7PM-7AM, please contact night-coverage If you still have difficulty reaching the attending provider, please page the [facility] (Director on Call) for [facility] on amion for assistance.

## 2021-08-08 NOTE — Progress Notes (Signed)
Inpatient Diabetes Program Recommendations  AACE/ADA: New Consensus Statement on Inpatient Glycemic Control (2015)  Target Ranges:  Prepandial:   less than 140 mg/dL      Peak postprandial:   less than 180 mg/dL (1-2 hours)      Critically ill patients:  140 - 180 mg/dL   Lab Results  Component Value Date   GLUCAP 329 (H) 08/08/2021   HGBA1C 6.9 (H) 05/12/2021    Review of Glycemic Control  Latest Reference Range & Units 08/06/21 16:41 08/06/21 21:11 08/07/21 07:25 08/07/21 11:08 08/07/21 16:58 08/07/21 21:11 08/08/21 08:42  Glucose-Capillary 70 - 99 mg/dL 354 (H) 258 (H) 313 (H) 247 (H) 329 (H) 320 (H) 329 (H)  Diabetes history: DM 2 Outpatient Diabetes medications:  Basaglar 12 units q AM, Novolog 4-10 units tid with meals, Amaryl 4 mg daily, Metformin 500 mg bid Current orders for Inpatient glycemic control:  Tradjenta 5 mg daily Novolog moderate tid with meals and HS Semglee 20 units daily Solumedrol 100 mg bid Inpatient Diabetes Program Recommendations:   Agree with increase in Semglee- may consider increasing frequency of Novolog correction to q 4 hours while on steroids.    Thanks,  Adah Perl, RN, BC-ADM Inpatient Diabetes Coordinator Pager [phone]  (8a-5p)

## 2021-08-08 NOTE — Plan of Care (Signed)
°  Problem: Coping: Goal: Ability to identify and develop effective coping behavior will improve Outcome: Not Progressing Goal: Demonstration of participation in decision-making regarding own care will improve Outcome: Not Progressing   Problem: Health Behavior/Discharge Planning: Goal: Identification of resources available to assist in meeting health care needs will improve Outcome: Not Progressing   Problem: Self-Concept: Goal: Will verbalize positive feelings about self Outcome: Not Progressing

## 2021-08-09 ENCOUNTER — Inpatient Hospital Stay (HOSPITAL_COMMUNITY): Payer: Medicare Other

## 2021-08-09 DIAGNOSIS — J1282 Pneumonia due to coronavirus disease 2019: Secondary | ICD-10-CM | POA: Diagnosis not present

## 2021-08-09 DIAGNOSIS — R7989 Other specified abnormal findings of blood chemistry: Secondary | ICD-10-CM | POA: Diagnosis not present

## 2021-08-09 DIAGNOSIS — I5043 Acute on chronic combined systolic (congestive) and diastolic (congestive) heart failure: Secondary | ICD-10-CM | POA: Diagnosis not present

## 2021-08-09 DIAGNOSIS — J15212 Pneumonia due to Methicillin resistant Staphylococcus aureus: Secondary | ICD-10-CM

## 2021-08-09 DIAGNOSIS — I214 Non-ST elevation (NSTEMI) myocardial infarction: Secondary | ICD-10-CM | POA: Diagnosis not present

## 2021-08-09 DIAGNOSIS — U071 COVID-19: Secondary | ICD-10-CM

## 2021-08-09 DIAGNOSIS — J9621 Acute and chronic respiratory failure with hypoxia: Secondary | ICD-10-CM | POA: Diagnosis not present

## 2021-08-09 DIAGNOSIS — R0609 Other forms of dyspnea: Secondary | ICD-10-CM

## 2021-08-09 LAB — COMPREHENSIVE METABOLIC PANEL
ALT: 10 U/L (ref 0–44)
AST: 15 U/L (ref 15–41)
Albumin: 2.6 g/dL — ABNORMAL LOW (ref 3.5–5.0)
Alkaline Phosphatase: 117 U/L (ref 38–126)
Anion gap: 8 (ref 5–15)
BUN: 34 mg/dL — ABNORMAL HIGH (ref 8–23)
CO2: 27 mmol/L (ref 22–32)
Calcium: 8.7 mg/dL — ABNORMAL LOW (ref 8.9–10.3)
Chloride: 102 mmol/L (ref 98–111)
Creatinine, Ser: 0.78 mg/dL (ref 0.44–1.00)
GFR, Estimated: >60 mL/min (ref >60)
Glucose, Bld: 204 mg/dL — ABNORMAL HIGH (ref 70–99)
Potassium: 4.1 mmol/L (ref 3.5–5.1)
Sodium: 137 mmol/L (ref 135–145)
Total Bilirubin: 0.6 mg/dL (ref 0.3–1.2)
Total Protein: 6.4 g/dL — ABNORMAL LOW (ref 6.5–8.1)

## 2021-08-09 LAB — APTT: aPTT: 25 seconds (ref 24–36)

## 2021-08-09 LAB — ECHOCARDIOGRAM COMPLETE
Height: 61 in
S' Lateral: 3.3 cm
Weight: 3283.97 oz

## 2021-08-09 LAB — CBC WITH DIFFERENTIAL/PLATELET
Abs Immature Granulocytes: 0.13 10*3/uL — ABNORMAL HIGH (ref 0.00–0.07)
Basophils Absolute: 0 10*3/uL (ref 0.0–0.1)
Basophils Relative: 0 %
Eosinophils Absolute: 0 10*3/uL (ref 0.0–0.5)
Eosinophils Relative: 0 %
HCT: 34.7 % — ABNORMAL LOW (ref 36.0–46.0)
Hemoglobin: 11.2 g/dL — ABNORMAL LOW (ref 12.0–15.0)
Immature Granulocytes: 1 %
Lymphocytes Relative: 2 %
Lymphs Abs: 0.2 10*3/uL — ABNORMAL LOW (ref 0.7–4.0)
MCH: 31.6 pg (ref 26.0–34.0)
MCHC: 32.3 g/dL (ref 30.0–36.0)
MCV: 98 fL (ref 80.0–100.0)
Monocytes Absolute: 0.4 10*3/uL (ref 0.1–1.0)
Monocytes Relative: 4 %
Neutro Abs: 9.3 10*3/uL — ABNORMAL HIGH (ref 1.7–7.7)
Neutrophils Relative %: 93 %
Platelets: 275 10*3/uL (ref 150–400)
RBC: 3.54 MIL/uL — ABNORMAL LOW (ref 3.87–5.11)
RDW: 15.3 % (ref 11.5–15.5)
WBC: 10.1 10*3/uL (ref 4.0–10.5)
nRBC: 0.2 % (ref 0.0–0.2)

## 2021-08-09 LAB — GLUCOSE, CAPILLARY
Glucose-Capillary: 206 mg/dL — ABNORMAL HIGH (ref 70–99)
Glucose-Capillary: 233 mg/dL — ABNORMAL HIGH (ref 70–99)
Glucose-Capillary: 181 mg/dL — ABNORMAL HIGH (ref 70–99)
Glucose-Capillary: 149 mg/dL — ABNORMAL HIGH (ref 70–99)

## 2021-08-09 LAB — HEPARIN LEVEL (UNFRACTIONATED): Heparin Unfractionated: >1.1 IU/mL — ABNORMAL HIGH (ref 0.30–0.70)

## 2021-08-09 LAB — BRAIN NATRIURETIC PEPTIDE: B Natriuretic Peptide: 551 pg/mL — ABNORMAL HIGH (ref 0.0–100.0)

## 2021-08-09 LAB — TROPONIN I (HIGH SENSITIVITY)
Troponin I (High Sensitivity): 16 ng/L (ref <18)
Troponin I (High Sensitivity): 150 ng/L (ref <18)
Troponin I (High Sensitivity): 928 ng/L (ref <18)
Troponin I (High Sensitivity): 2835 ng/L

## 2021-08-09 LAB — C-REACTIVE PROTEIN: CRP: 3.3 mg/dL — ABNORMAL HIGH (ref <1.0)

## 2021-08-09 LAB — MAGNESIUM: Magnesium: 2.2 mg/dL (ref 1.7–2.4)

## 2021-08-09 LAB — D-DIMER, QUANTITATIVE: D-Dimer, Quant: 2.19 ug/mL-FEU — ABNORMAL HIGH (ref 0.00–0.50)

## 2021-08-09 MED ORDER — PERFLUTREN LIPID MICROSPHERE
1.0000 mL | INTRAVENOUS | Status: AC | PRN
Start: 2021-08-09 — End: 2021-08-09
  Administered 2021-08-09: 13:00:00 2 mL via INTRAVENOUS

## 2021-08-09 MED ORDER — FUROSEMIDE 10 MG/ML IJ SOLN
40.0000 mg | Freq: Once | INTRAMUSCULAR | Status: AC
  Administered 2021-08-09: 10:00:00 40 mg via INTRAVENOUS
  Filled 2021-08-09: qty 4

## 2021-08-09 MED ORDER — FUROSEMIDE 10 MG/ML IJ SOLN
40.0000 mg | Freq: Once | INTRAMUSCULAR | Status: AC
  Administered 2021-08-09: 17:00:00 40 mg via INTRAVENOUS
  Filled 2021-08-09: qty 4

## 2021-08-09 MED ORDER — HEPARIN (PORCINE) 25000 UT/250ML-% IV SOLN
800.0000 [IU]/h | INTRAVENOUS | Status: DC
  Administered 2021-08-09: 21:00:00 900 [IU]/h via INTRAVENOUS
  Administered 2021-08-12 – 2021-08-20 (×6): 700 [IU]/h via INTRAVENOUS
  Filled 2021-08-09 (×9): qty 250

## 2021-08-09 MED ORDER — MORPHINE SULFATE (PF) 2 MG/ML IV SOLN
2.0000 mg | INTRAVENOUS | Status: DC | PRN
  Administered 2021-08-09 – 2021-08-20 (×5): 2 mg via INTRAVENOUS
  Filled 2021-08-09 (×6): qty 1

## 2021-08-09 MED ORDER — ASPIRIN 81 MG PO CHEW
81.0000 mg | CHEWABLE_TABLET | Freq: Every day | ORAL | Status: DC
  Administered 2021-08-09 – 2021-08-20 (×12): 81 mg via ORAL
  Filled 2021-08-09 (×12): qty 1

## 2021-08-09 MED ORDER — ONDANSETRON HCL 4 MG/2ML IJ SOLN
4.0000 mg | Freq: Four times a day (QID) | INTRAMUSCULAR | Status: DC | PRN
  Administered 2021-08-09 – 2021-08-17 (×2): 4 mg via INTRAVENOUS
  Filled 2021-08-09 (×2): qty 2

## 2021-08-09 NOTE — Assessment & Plan Note (Addendum)
12/20 sputum cx with MRSA (and candida albicans - suspect this is contaminant/colonizer)  On doxycycline, plan for 7 days (12/23 - present) As above

## 2021-08-09 NOTE — Assessment & Plan Note (Addendum)
As above Steroids, remdesivir (s/p 5 doses) Tapering steroids

## 2021-08-09 NOTE — Progress Notes (Signed)
VASCULAR LAB    Bilateral lower extremity venous duplex has been performed.  See CV proc for preliminary results.   Elhadj Girton, RVT 08/09/2021, 3:05 PM

## 2021-08-09 NOTE — Progress Notes (Signed)
Pt complaining of chest pain. Chest pain started this morning. Pt stated it feels similar to her last heart attack. She says it feels like a squeezing sharp pain. Pain traveling down right arm. Nitroglycerin given x2. Oxycodin given to help with pain. All morning 10am meds given at 8;30am. Morphine given & phenergan due to continued chest pain. EKG done. Laxis also given. Dr. Florene Glen notified & at beside. Orders placed for x ray of chest. Troponin drawn. Wellman placed on patient. Pt complaining of pain traveling to jaw now. Gave another dose of nitroglycerin at 10am.

## 2021-08-09 NOTE — Assessment & Plan Note (Addendum)
On anticoagulation Follow LE [place] - negative

## 2021-08-09 NOTE — Progress Notes (Addendum)
PROGRESS NOTE    Katherine [name]  PJA:[id number] DOB: 04-05-1956 DOA: 08/04/2021 PCP: [name], MD  Chief Complaint  Patient presents with   generalized weakness    Brief Narrative:  This is Katherine [name] 65 year old female with anxiety, depression, CAD/CABG, chronic hypoxic respiratory failure related to COPD on 3 L of oxygen at home, type 2 diabetes mellitus, end-stage renal disease status post renal transplant, GERD, obesity, hypertension paroxysmal atrial fibrillation, PAD who presented to the hospital on 12/19 with shortness of breath and generalized weakness.  She was found to have Benita Boonstra pulse ox of 84% on her usual oxygen, noted to be wheezing and had Evangelos Paulino cough with yellow sputum.  COVID PCR noted to be positive.  She's been admitted for COVID 19 pneumonia and now found to have MRSA pneumonia.  She's had progressively worsening O2 needs, now on heated high flow.    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  Possible contribution from HF. Uses 2 LPM at home COVID test  + on 12/18 CXR 12/24 with worsening severe diffuse bilateral airspace disease CT PE protocol chest 08/05/2021: limited for PE, but negative, extensive heterogenous and GGO throughout lungs  Satting in mid 80's today, RT to eval for heated high flow Elevated BNP -> lasix as tolerated Continue remdesivir dm IV steroids for covid and abx to cover for MRSA pneumonia She's immunosuppressed on antirejection meds with renal transplant (not candidate for baricitinib or actemra with MRSA infection and immunosuppression).  Poor prognosis with immunosuppression and covid.  COVID-19 Labs  Recent Labs    08/07/21 0254 08/08/21 0256 08/09/21 0253  DDIMER 1.69* 1.84* 2.19*  CRP 5.6* 3.9* 3.3*    COVID-19 Labs  Recent Labs    08/07/21 0254 08/08/21 0256 08/09/21 0253  DDIMER 1.69* 1.84* 2.19*  CRP 5.6* 3.9* 3.3*    Lab Results  Component Value Date   SARSCOV2NAA POSITIVE (Natasja Niday) 08/04/2021   [place] NEGATIVE 05/12/2021   [place] NEGATIVE 04/19/2021   [place] NEGATIVE 03/30/2021     MRSA pneumonia ([place]) On doxycycline As above  COVID-19 virus infection- (present on admission) As above Steroids, remdesivir  Acute CHF (congestive heart failure) (HCC) Elevated BNP Follow echo Lasix as tolerated, follow renal function Strict I/O, daily weights  Chest pain- (present on admission) Concerning for NSTEMI given report similar to prior cardiac events Continue nitro prn (resolved after 3rd dose) EKG similar to priors, initial trop negative -> 2nd rising Already anticoagulated on eliquis, antiplatelet on plavix -> given concerning symptoms, will transition to  heparin and add aspirin after discussion with cards Follow echo and repeat EKG Trend further troponins Cardiology c/s, appreciate assistance  Uncontrolled type 2 diabetes mellitus with hyperglycemia, with long-term current use of insulin ([place]) with neuropathy Hyperglycemia in the setting of steroid use. Will be  increasing Semglee from 16 to 22 U today Continue aggressive sliding scale insulin. 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 Receives prednisone and Imuran. Placing the patient at high risk for poor outcome secondary to chronic immunosuppressive status  Paroxysmal atrial fibrillation (HCC); CHA2DS2VASc score F, HTN, CAD, CVA = 5- (present on admission) Continue Metoprolol and Eliquis -> now on heparin gtt  CAD S/P percutaneous coronary angioplasty - PCI x 5 to SVG-D1 Prior history.  Monitor. W/u for chest pain above  PAD (peripheral artery disease) ([place])- (present on admission) Prior history.  Monitor.  Chronic diastolic CHF (congestive heart failure) (HCC)- (present on admission) Grade 1 diastolic dysfunction  Pressure injury of skin- (present on admission) Stage II, medial rectal ulcer. Present on admission. Continue dressing changes  OSA (obstructive sleep apnea)- (present on admission) Does not use CPAP at night after lap band.  Hypomagnesemia- (present on admission) Replaced.  Obesity (BMI 30-39.9)- (present on admission) Body mass index is 37.03 kg/m.  Placing the patient at high risk of poor outcome.  Monitor.  GAD (generalized anxiety disorder)- (present on admission) Continue Zoloft and Ativan.  Dyslipidemia, goal LDL below 70- (present on admission) Continue statin.   DVT prophylaxis: heparin gtt (previously eliquis) Code Status: full Family Communication: none at bedside Disposition:   Status is: Inpatient  Remains inpatient appropriate because: sig O2 requirement, worsening       Consultants:  cardiology  Procedures:  echo  Antimicrobials:  Anti-infectives (From admission, onward)    Start     Dose/Rate Route Frequency Ordered Stop   08/08/21 1400  doxycycline (VIBRAMYCIN) 100 mg in sodium chloride 0.9 % 250 mL IVPB        100 mg 125 mL/hr over 120 Minutes Intravenous  Every 12 hours 08/08/21 1327     08/06/21 1000  remdesivir 100 mg in sodium chloride 0.9 % 100 mL IVPB  Status:  Discontinued       See Hyperspace for full Linked Orders Report.   100 mg 200 mL/hr over 30 Minutes Intravenous Daily 08/05/21 0720 08/05/21 0720   08/06/21 1000  remdesivir 100 mg in sodium chloride 0.9 % 100 mL IVPB        100 mg 200 mL/hr over 30 Minutes Intravenous Daily 08/05/21 0721 08/09/21 0937   08/05/21 1000  remdesivir 100 mg in sodium chloride 0.9 % 100 mL IVPB  Status:  Discontinued       See Hyperspace for full Linked Orders Report.   100 mg 200 mL/hr over 30 Minutes Intravenous Daily 08/04/21 0819 08/05/21 0720   08/05/21 1000  remdesivir 200 mg in sodium chloride 0.9% 250 mL IVPB        200 mg 580 mL/hr over 30 Minutes Intravenous Once 08/05/21 0719 08/05/21 1150   08/05/21 1000  cefTRIAXone (ROCEPHIN) 2 g in sodium chloride 0.9 % 100 mL IVPB        2 g 200 mL/hr over 30 Minutes Intravenous Every 24 hours 08/05/21 0815 08/09/21 1032   08/05/21 1000  azithromycin (ZITHROMAX) 500 mg in sodium chloride 0.9 % 250 mL IVPB  Status:  Discontinued        500 mg 250 mL/hr over  60 Minutes Intravenous Every 24 hours 08/05/21 0815 08/05/21 0907   08/04/21 0930  remdesivir 200 mg in sodium chloride 0.9% 250 mL IVPB  Status:  Discontinued       See Hyperspace for full Linked Orders Report.   200 mg 580 mL/hr over 30 Minutes Intravenous Once 08/04/21 0819 08/05/21 0720       Subjective: C/o CP, similar to prior cardiac events  Objective: Vitals:   08/09/21 1208 08/09/21 1300 08/09/21 1338 08/09/21 1343  BP:  134/68    Pulse: (!) 59 (!) 56 (!) 56 (!) 55  Resp: (!) 24 (!) 24 (!) 21 (!) 25  Temp:      TempSrc:      SpO2: 95% 96% 98% 92%  Weight:      Height:        Intake/Output Summary (Last 24 hours) at 08/09/2021 1347 Last data filed at 08/09/2021 1132 Gross per 24 hour  Intake 1033.09 ml  Output 1700 ml  Net -666.91 ml   Filed Weights   08/07/21 0455  08/08/21 0500 08/09/21 0439  Weight: 92.9 kg 93.9 kg 93.1 kg    Examination:  General exam: Appears calm and comfortable  Respiratory system: distant, no crackles or rhonchi appreciated Cardiovascular system: RRR Gastrointestinal system: Abdomen is nondistended, soft and nontender.  Central nervous system: Alert and oriented. No focal neurological deficits. Extremities: no sig LE edema Skin: No rashes, lesions or ulcers Psychiatry: Judgement and insight appear normal. Mood & affect appropriate.     Data Reviewed: I have personally reviewed following labs and imaging studies  CBC: Recent Labs  Lab 08/05/21 0252 08/06/21 0252 08/07/21 0254 08/08/21 0256 08/09/21 0253  WBC 5.0 8.6 10.7* 11.1* 10.1  NEUTROABS 4.4 8.0* 10.0* 10.4* 9.3*  HGB 11.3* 10.1* 10.8* 10.7* 11.2*  HCT 34.6* 29.8* 32.2* 32.1* 34.7*  MCV 97.2 95.2 95.3 95.8 98.0  PLT 177 218 250 282 315    Basic Metabolic Panel: Recent Labs  Lab 08/04/21 0635 08/05/21 0252 08/06/21 0252 08/07/21 0254 08/08/21 0256 08/09/21 0253  NA  --  132* 131* 131* 137 137  K  --  4.1 3.5 4.0 3.9 4.1  CL  --  101 96* 96* 100 102  CO2  --  23 23 25 27 27   GLUCOSE  --  176* 254* 281* 255* 204*  BUN  --  15 19 21  26* 34*  CREATININE  --  0.77 0.92 0.91 0.74 0.78  CALCIUM  --  8.5* 8.6* 8.9 9.0 8.7*  MG 1.5* 2.2 1.9 2.1 2.1 2.2  PHOS 2.7 2.9  --   --   --   --     GFR: Estimated Creatinine Clearance: 73.9 mL/min (by C-G formula based on SCr of 0.78 mg/dL).  Liver Function Tests: Recent Labs  Lab 08/05/21 0252 08/06/21 0252 08/07/21 0254 08/08/21 0256 08/09/21 0253  AST 35 23 29 17 15   ALT 9 10 9 9 10   ALKPHOS 74 85 111 112 117  BILITOT 1.1 0.5 0.7 0.5 0.6  PROT 6.8 6.5 6.8 6.6 6.4*  ALBUMIN 2.8* 2.8* 2.9* 2.7* 2.6*    CBG: Recent Labs  Lab 08/08/21 1239 08/08/21 1649 08/08/21 2102 08/09/21 0812 08/09/21 1123  GLUCAP 368* 301* 207* 206* 233*     Recent Results (from the past 240 hour(s))  Resp  Panel by RT-PCR (Flu Maddax Palinkas&B, Covid) Nasopharyngeal Swab     Status: Abnormal   Collection Time: 08/04/21  3:22 AM   Specimen: Nasopharyngeal Swab;  Nasopharyngeal(NP) swabs in vial transport medium  Result Value Ref Range Status   SARS Coronavirus 2 by RT PCR POSITIVE (Shakela Donati) NEGATIVE Final    Comment: (NOTE) SARS-CoV-2 target nucleic acids are DETECTED.  The SARS-CoV-2 RNA is generally detectable in upper respiratory specimens during the acute phase of infection. Positive results are indicative of the presence of the identified virus, but do not rule out bacterial infection or co-infection with other pathogens not detected by the test. Clinical correlation with patient history and other diagnostic information is necessary to determine patient infection status. The expected result is Negative.  Fact Sheet for Patients: EntrepreneurPulse.com.au  Fact Sheet for Healthcare Providers: IncredibleEmployment.be  This test is not yet approved or cleared by the [place] FDA and  has been authorized for detection and/or diagnosis of SARS-CoV-2 by FDA under an Emergency Use Authorization (EUA).  This EUA will remain in effect (meaning this test can be used) for the duration of  the COVID-19 declaration under Section 564(b)(1) of the [place] [address], 21 U.S.C. section 360bbb-3(b)(1), unless the authorization is terminated or revoked sooner.     Influenza Naythen Heikkila by PCR NEGATIVE NEGATIVE Final   Influenza B by PCR NEGATIVE NEGATIVE Final    Comment: (NOTE) The Xpert Xpress SARS-CoV-2/FLU/RSV plus assay is intended as an aid in the diagnosis of influenza from Nasopharyngeal swab specimens and should not be used as Eliot Popper sole basis for treatment. Nasal washings and aspirates are unacceptable for Xpert Xpress SARS-CoV-2/FLU/RSV testing.  Fact Sheet for Patients: EntrepreneurPulse.com.au  Fact Sheet for Healthcare  Providers: IncredibleEmployment.be  This test is not yet approved or cleared by the [place] FDA and has been authorized for detection and/or diagnosis of SARS-CoV-2 by FDA under an Emergency Use Authorization (EUA). This EUA will remain in effect (meaning this test can be used) for the duration of the COVID-19 declaration under Section 564(b)(1) of the Act, 21 U.S.C. section 360bbb-3(b)(1), unless the authorization is terminated or revoked.  Performed at [facility], [place] [address], [place], [place] [address]   MRSA Next Gen by PCR, Nasal     Status: None   Collection Time: 08/04/21  4:36 PM   Specimen: Nasal Mucosa; Nasal Swab  Result Value Ref Range Status   MRSA by PCR Next Gen NOT DETECTED NOT DETECTED Final    Comment: (NOTE) The GeneXpert MRSA Assay (FDA approved for NASAL specimens only), is one component of Yazleen Molock comprehensive MRSA colonization surveillance program. It is not intended to diagnose MRSA infection nor to guide or monitor treatment for MRSA infections. Test performance is not FDA approved in patients less than 35 years old. Performed at [facility], [place] [address], [place], [place] [address]   Culture, blood (routine x 2) Call MD if unable to obtain prior to antibiotics being given     Status: None (Preliminary result)   Collection Time: 08/05/21 10:12 AM   Specimen: BLOOD LEFT HAND  Result Value Ref Range Status   Specimen Description   Final    BLOOD LEFT HAND Performed at [facility] [address], [place], [place] [address]    Special Requests   Final    BOTTLES DRAWN AEROBIC ONLY Blood Culture results may not be optimal due to an inadequate volume of blood received in culture bottles Performed at [facility] [address] [place],  [address]    Culture   Final    NO GROWTH 4 DAYS Performed at [facility], [place] [address]  [place], [place] [address]    Report Status PENDING  Incomplete  Expectorated Sputum Assessment w Gram Stain, Rflx to Resp Cult     Status: None   Collection Time: 08/05/21 10:31 AM   Specimen: Expectorated Sputum  Result Value Ref Range Status   Specimen Description EXPECTORATED SPUTUM  Final   Special Requests NONE  Final   Sputum evaluation   Final    Sputum specimen not acceptable for testing.  Please recollect.   RESULT CALLED TO, READ BACK BY AND VERIFIED WITH: NOTIFY Joesphine Bare RN ON 07/2021 @ 1104 BY MECIAL J. Performed at [facility], [place] [address], [place], [place] [address]    Report Status 08/05/2021 FINAL  Final  Culture, blood (routine x 2) Call MD if unable to obtain prior to antibiotics being given     Status: None (Preliminary result)   Collection Time: 08/05/21 11:07 AM   Specimen: BLOOD RIGHT HAND  Result Value Ref Range Status   Specimen Description   Final    BLOOD RIGHT HAND Performed at [facility] [address], [place], [place] [address]    Special Requests   Final    BOTTLES DRAWN AEROBIC ONLY Blood Culture results may not be optimal due to an inadequate volume of blood received in culture bottles Performed at [facility] [address] [place], [place] [address]    Culture   Final    NO GROWTH 4 DAYS Performed at [facility], [place] [address], [place], [place] [address]    Report Status PENDING  Incomplete  Expectorated Sputum Assessment w Gram Stain, Rflx to Resp Cult     Status: None   Collection Time: 08/05/21  1:29 PM   Specimen: SPU  Result Value Ref Range Status   Specimen Description SPUTUM  Final   Special Requests NONE  Final   Sputum evaluation   Final    THIS SPECIMEN IS ACCEPTABLE FOR SPUTUM CULTURE Performed at [facility], [place] [address], [place], [place] [address]    Report Status 08/05/2021 FINAL  Final  Culture, Respiratory w Gram Stain     Status:  None   Collection Time: 08/05/21  1:29 PM   Specimen: SPU  Result Value Ref Range Status   Specimen Description   Final    SPUTUM Performed at [place] [address] [place], [place] [address]    Special Requests   Final    NONE Reflexed from [phone] Performed at [facility], [place] [address], [place], [place] [address]    Gram Stain   Final    ABUNDANT WBC PRESENT, PREDOMINANTLY MONONUCLEAR MODERATE YEAST FEW GRAM POSITIVE RODS FEW GRAM POSITIVE COCCI Performed at [facility], [place] [address] [place],  [address]    Culture   Final    MODERATE METHICILLIN RESISTANT STAPHYLOCOCCUS AUREUS FEW CANDIDA ALBICANS    Report Status 08/08/2021 FINAL  Final   Organism ID, Bacteria METHICILLIN RESISTANT STAPHYLOCOCCUS AUREUS  Final      Susceptibility   Methicillin resistant staphylococcus aureus - MIC*    CIPROFLOXACIN >=8 RESISTANT Resistant     ERYTHROMYCIN >=8 RESISTANT Resistant     GENTAMICIN <=0.5 SENSITIVE Sensitive     OXACILLIN >=4 RESISTANT Resistant     TETRACYCLINE <=1 SENSITIVE Sensitive     VANCOMYCIN <=0.5 SENSITIVE Sensitive     TRIMETH/SULFA <=10 SENSITIVE Sensitive  CLINDAMYCIN >=8 RESISTANT Resistant     RIFAMPIN <=0.5 SENSITIVE Sensitive     Inducible Clindamycin NEGATIVE Sensitive     * MODERATE METHICILLIN RESISTANT STAPHYLOCOCCUS AUREUS         Radiology Studies: DG CHEST PORT 1 VIEW  Result Date: 08/09/2021 CLINICAL DATA:  MRSA pneumonia EXAM: PORTABLE CHEST 1 VIEW COMPARISON:  10/08/2020 FINDINGS: Changes of CABG. Cardiomegaly. Severe diffuse bilateral airspace disease, worsening since prior study. No effusions. No acute bony abnormality. IMPRESSION: Severe diffuse bilateral airspace disease, worsening since prior study. Electronically Signed   By: Rolm Baptise M.D.   On: 08/09/2021 09:21   DG CHEST PORT 1 VIEW  Result Date: 08/07/2021 CLINICAL DATA:  Shortness of breath, COVID-19  positivity, initial encounter EXAM: PORTABLE CHEST 1 VIEW COMPARISON:  08/05/2021 FINDINGS: Cardiac shadow is stable. Lungs are well aerated bilaterally. Patchy airspace opacity is noted slightly improved when compare with the prior exam. No new focal abnormality is noted. IMPRESSION: Patchy airspace opacities improved in the interval from the prior study. Electronically Signed   By: Inez Catalina M.D.   On: 08/07/2021 22:35        Scheduled Meds:  (feeding supplement) PROSource Plus  30 mL Oral BID BM   arformoterol  15 mcg Nebulization BID   vitamin C  500 mg Oral Daily   aspirin  81 mg Oral Daily   budesonide  2 mL Nebulization BID   calcitRIOL  0.25 mcg Oral Q3 days   Chlorhexidine Gluconate Cloth  6 each Topical Daily   clopidogrel  75 mg Oral Daily   feeding supplement (GLUCERNA SHAKE)  237 mL Oral Q24H   fluticasone  2 spray Each Nare Daily   furosemide  40 mg Intravenous Once   guaiFENesin-dextromethorphan  10 mL Oral QID   insulin aspart  0-15 Units Subcutaneous TID WC   insulin aspart  0-5 Units Subcutaneous QHS   insulin aspart  4 Units Subcutaneous TID WC   insulin glargine-yfgn  20 Units Subcutaneous Daily   isosorbide mononitrate  60 mg Oral Daily   lamoTRIgine  25 mg Oral QHS   linagliptin  5 mg Oral Daily   loratadine  10 mg Oral Daily   magic mouthwash  5 mL Oral QID   mouth rinse  15 mL Mouth Rinse BID   methylPREDNISolone (SOLU-MEDROL) injection  100 mg Intravenous Q12H   metoprolol tartrate  100 mg Oral BID   montelukast  10 mg Oral QHS   multivitamin with minerals  1 tablet Oral Daily   pantoprazole  40 mg Oral Daily   Ensure Max Protein  11 oz Oral Daily   rosuvastatin  20 mg Oral Daily   sertraline  100 mg Oral Daily   zinc sulfate  220 mg Oral Daily   Continuous Infusions:  doxycycline (VIBRAMYCIN) IV Stopped (08/09/21 0419)   heparin       LOS: 5 days    Time spent: over 30 min 50 min critical care time, NSTEMI, ahrf on HHFNC  Fayrene Helper,  MD [facility]   To contact the attending provider between 7A-7P or the covering provider during after hours 7P-7A, please log into the web site [email or web address] and access using universal [place] password for that web site. If you do not have the password, please call the hospital operator.  08/09/2021, 1:47 PM

## 2021-08-09 NOTE — Progress Notes (Signed)
ANTICOAGULATION CONSULT NOTE   Pharmacy Consult for Heparin Indication: chest pain/ACS  Allergies  Allergen Reactions   Tetracycline Hives    Patient tolerated Doxycycline Dec 2020   Niacin Other (See Comments)    Mouth blisters   Niaspan [Niacin Er] Other (See Comments)    Mouth blisters   Sulfa Antibiotics Nausea Only and Other (See Comments)    "Tears up stomach"   Sulfonamide Derivatives Other (See Comments)    Reaction: per patient "tears her stomach up"   Codeine Nausea And Vomiting   Erythromycin Nausea And Vomiting   Hydromorphone Hcl Nausea And Vomiting   Morphine And Related Nausea And Vomiting   Nalbuphine Nausea And Vomiting    Nubain   Sulfasalazine Nausea Only and Other (See Comments)    per patient "tears her stomach up", "Tears up stomach"   Tape Rash and Other (See Comments)    No "plastic" tape," please----cloth tape only   Patient Measurements: Height: 5\' 1"  (154.9 cm) Weight: 93.1 kg (205 lb 4 oz) IBW/kg (Calculated) : 47.8 Heparin Dosing Weight: 66kg  Vital Signs: Temp: 98.3 F (36.8 C) (12/24 1139) Temp Source: Axillary (12/24 1139) BP: 161/81 (12/24 1200) Pulse Rate: 59 (12/24 1208)  Labs: Recent Labs    08/07/21 0254 08/08/21 0256 08/09/21 0253 08/09/21 0921 08/09/21 1117  HGB 10.8* 10.7* 11.2*  --   --   HCT 32.2* 32.1* 34.7*  --   --   PLT 250 282 275  --   --   CREATININE 0.91 0.74 0.78  --   --   TROPONINIHS  --   --   --  16 150*    Estimated Creatinine Clearance: 73.9 mL/min (by C-G formula based on SCr of 0.78 mg/dL).   Medical History: Past Medical History:  Diagnosis Date   Anemia    Anxiety    Bilateral carotid artery stenosis    Carotid duplex 0/5397: 6-73% LICA, 41-93% RICA, >79% RECA, f/u 1 yr suggested   CAD (coronary artery disease) of bypass graft 5/01; 3/'02, 8/'03, 10/'04; 1/15   PCI x 5 to SVG-D1    CAD in native artery 07/1993   3 Vessel Disease (LAD-D1 & RCA) -- CABG (Dx in setting of inferior STEMI-PTCA  of RCA)   CAD S/P percutaneous coronary angioplasty    PCI to SVG-D1 insertion/native D1 x 4 = '01 -(S660 BMS 2.5 x 9 anastomosis- D1); '02 - distal overlap ACS Pixel 2.5 x 8  BMS; '03 distal/native ISR/Thrombosis - Pixel 2.5 x 13; '04 - ISR-  Taxus 2.5 x 20 (covered all);; 1/15 - mid SVG-D1 (50% distal ISR) - Promus P 2.75 x 20 -- 2.8 mm; 6/22: Extensive ISR PTCA & Anastomotic-Native Diag DES PCI ([place] Onyx 2.25x12 (2.75-2.5 mm post-dilation   COPD mixed type (HCC)    Followed by Dr. Lamonte Sakai "pulmonologist said no COPD"   Depression with anxiety    Diabetes mellitus type 2 in obese ([place])    Diarrhea    started after cholecystectomy and mass removed from intestine   Dyslipidemia, goal LDL below 70    08/2012: TC 137, TG 200, HDL 32!, LDL 45; on statin (followed by Dr.Deterding)   ESRD (end stage renal disease) ([place]) 1991   s/p Cadaveric Renal Transplant ([facility] - Dr. Jimmy Footman)    Family history of adverse reaction to anesthesia    mom's bp dropped during/after anesthesia   Fibromyalgia    GERD (gastroesophageal reflux disease)    Glomerulonephritis, chronic, rapidly progressive 1989  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 07/1993   Rescue PTCA of RCA -- referred for CABG.   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  Had having having lots of nausea and vomiting with poor p.o. intake.  Thought to have QT prolongation with multiple medications involved and hypomagnesemia, hypokalemia.  Tikosyn discontinued along with Zoloft and Phenergan.   Hypertension associated with diabetes ([place])    Mild aortic stenosis by prior echocardiogram 07/2019   Echo:  Mild aortic stenosis (gradients: Mean 14.3  mmHg -peak 24.9 mmHg).   Morbid obesity (HCC)    MRSA (methicillin resistant staph aureus) culture positive    OSA (obstructive sleep apnea)    no longer on CPAP or home O2, states she doesn't need now after lap band   PAD (peripheral artery disease) ([place]) 08/2013   LEA Dopplers to be read by Dr. Fletcher Anon   PAF (paroxysmal atrial fibrillation) ([place]) 06/2014   Noted on CardioNet Monitor  - --> rhythm control with Tikosyn (Dr. Rayann Heman); converted from warfarin to apixaban for anticoagulation.   Pneumonia    Recurrent boils    Bilateral Groin   Rheumatoid arthritis ([place])    Per Patient Report; associated with OA   S/p cadaver renal transplant 1991   [facility]   Torsades de pointes 05/12/2021   Unstable angina ([place]) 5/01; 3/'02, 8/'03, 10/'04; 1/15   x 5 occurences since Inf-Post STEMI in 1994    Medications:  Scheduled:   (feeding supplement) PROSource Plus  30 mL Oral BID BM   arformoterol  15 mcg Nebulization BID   vitamin C  500 mg Oral Daily   aspirin  81 mg Oral Daily   budesonide  2 mL Nebulization BID   calcitRIOL  0.25 mcg Oral Q3 days   Chlorhexidine Gluconate Cloth  6 each Topical Daily   clopidogrel  75 mg Oral Daily   feeding supplement (GLUCERNA SHAKE)  237 mL Oral Q24H   fluticasone  2 spray Each Nare Daily   furosemide  40 mg Intravenous Once   guaiFENesin-dextromethorphan  10 mL Oral QID   insulin aspart  0-15 Units Subcutaneous TID WC   insulin aspart  0-5 Units Subcutaneous QHS   insulin aspart  4 Units Subcutaneous TID WC   insulin glargine-yfgn  20 Units Subcutaneous Daily   isosorbide mononitrate  60 mg Oral Daily   lamoTRIgine  25 mg Oral QHS   linagliptin  5 mg Oral Daily   loratadine  10 mg Oral Daily   magic mouthwash  5 mL Oral QID   mouth rinse  15 mL Mouth Rinse BID   methylPREDNISolone (SOLU-MEDROL) injection  100 mg Intravenous Q12H   metoprolol tartrate  100 mg Oral BID   montelukast  10 mg Oral QHS   multivitamin with minerals  1 tablet Oral Daily    pantoprazole  40 mg Oral Daily   Ensure Max Protein  11 oz Oral Daily   rosuvastatin  20 mg Oral Daily   sertraline  100 mg Oral Daily   zinc sulfate  220 mg Oral Daily   Infusions:   doxycycline (VIBRAMYCIN) IV Stopped (08/09/21  4944)   heparin      Assessment: 65 yoF, Covid +, requiring high O2 administration. Currently on Doxycycline for MRSA PNA, high dose SoluMedrol, completed Remdesivir. Complaint of chest pain, Troponin elevated: d/c Apixaban 5mg  bid for Afib > Heparin infusion. Last dose Apixaban 12/24 at 0900 Note that Apixaban will artificially elevate Hep levels, will dose based on aPTT until both levels correlate  Goal of Therapy:  Heparin level 0.3-0.7 units/ml aPTT 66-102 seconds Monitor platelets by anticoagulation protocol: Yes   Plan:  Apixaban discontinued Baseline aPTT and Hep level at 8p Begin Heparin at 900 units/hr at 2100 with no bolus  Minda Ditto PharmD 08/09/2021,12:58 PM

## 2021-08-09 NOTE — Assessment & Plan Note (Addendum)
Elevated BNP Follow echo - EF 45-50%, RWMA (see report) Lasix as tolerated, follow renal function Strict I/O, daily weights - net negative

## 2021-08-09 NOTE — Assessment & Plan Note (Addendum)
Episode of CP similar to prior heart attacks on 12/24 Continue nitro prn  Was already anticoagulated on eliquis, antiplatelet on plavix -> given concerning symptoms/rising troponin -> transitioned to heparin gtt, aspirin added to placix EF 45-50%, RWMA's (see report) Cardiology c/s, appreciate assistance -> plan for aspirin, plavix, imdur (increased dose), lopressor, crestor - consider arb/arni if BP remains high, consider cath pending clinical status (note 12/25) Would want her O2 needs to improve prior to diagnostic cath - still has significant O2 needs Ok to work with PT/OT if asymptomatic

## 2021-08-10 DIAGNOSIS — I214 Non-ST elevation (NSTEMI) myocardial infarction: Secondary | ICD-10-CM | POA: Diagnosis not present

## 2021-08-10 DIAGNOSIS — J9621 Acute and chronic respiratory failure with hypoxia: Secondary | ICD-10-CM | POA: Diagnosis not present

## 2021-08-10 LAB — COMPREHENSIVE METABOLIC PANEL
ALT: 11 U/L (ref 0–44)
AST: 27 U/L (ref 15–41)
Albumin: 2.3 g/dL — ABNORMAL LOW (ref 3.5–5.0)
Alkaline Phosphatase: 99 U/L (ref 38–126)
Anion gap: 8 (ref 5–15)
BUN: 38 mg/dL — ABNORMAL HIGH (ref 8–23)
CO2: 31 mmol/L (ref 22–32)
Calcium: 8.4 mg/dL — ABNORMAL LOW (ref 8.9–10.3)
Chloride: 99 mmol/L (ref 98–111)
Creatinine, Ser: 0.77 mg/dL (ref 0.44–1.00)
GFR, Estimated: >60 mL/min (ref >60)
Glucose, Bld: 119 mg/dL — ABNORMAL HIGH (ref 70–99)
Potassium: 3.4 mmol/L — ABNORMAL LOW (ref 3.5–5.1)
Sodium: 138 mmol/L (ref 135–145)
Total Bilirubin: 0.5 mg/dL (ref 0.3–1.2)
Total Protein: 5.9 g/dL — ABNORMAL LOW (ref 6.5–8.1)

## 2021-08-10 LAB — CBC WITH DIFFERENTIAL/PLATELET
Abs Immature Granulocytes: 0.13 10*3/uL — ABNORMAL HIGH (ref 0.00–0.07)
Basophils Absolute: 0 10*3/uL (ref 0.0–0.1)
Basophils Relative: 0 %
Eosinophils Absolute: 0 10*3/uL (ref 0.0–0.5)
Eosinophils Relative: 0 %
HCT: 32 % — ABNORMAL LOW (ref 36.0–46.0)
Hemoglobin: 10.6 g/dL — ABNORMAL LOW (ref 12.0–15.0)
Immature Granulocytes: 1 %
Lymphocytes Relative: 3 %
Lymphs Abs: 0.3 10*3/uL — ABNORMAL LOW (ref 0.7–4.0)
MCH: 31.6 pg (ref 26.0–34.0)
MCHC: 33.1 g/dL (ref 30.0–36.0)
MCV: 95.5 fL (ref 80.0–100.0)
Monocytes Absolute: 0.4 10*3/uL (ref 0.1–1.0)
Monocytes Relative: 5 %
Neutro Abs: 8.4 10*3/uL — ABNORMAL HIGH (ref 1.7–7.7)
Neutrophils Relative %: 91 %
Platelets: 315 10*3/uL (ref 150–400)
RBC: 3.35 MIL/uL — ABNORMAL LOW (ref 3.87–5.11)
RDW: 15.3 % (ref 11.5–15.5)
WBC: 9.2 10*3/uL (ref 4.0–10.5)
nRBC: 0.2 % (ref 0.0–0.2)

## 2021-08-10 LAB — GLUCOSE, CAPILLARY
Glucose-Capillary: 115 mg/dL — ABNORMAL HIGH (ref 70–99)
Glucose-Capillary: 137 mg/dL — ABNORMAL HIGH (ref 70–99)
Glucose-Capillary: 242 mg/dL — ABNORMAL HIGH (ref 70–99)
Glucose-Capillary: 238 mg/dL — ABNORMAL HIGH (ref 70–99)

## 2021-08-10 LAB — CULTURE, BLOOD (ROUTINE X 2)
Culture: NO GROWTH
Culture: NO GROWTH

## 2021-08-10 LAB — D-DIMER, QUANTITATIVE: D-Dimer, Quant: 2.01 ug/mL-FEU — ABNORMAL HIGH (ref 0.00–0.50)

## 2021-08-10 LAB — PHOSPHORUS: Phosphorus: 3.7 mg/dL (ref 2.5–4.6)

## 2021-08-10 LAB — APTT
aPTT: 74 seconds — ABNORMAL HIGH (ref 24–36)
aPTT: 92 seconds — ABNORMAL HIGH (ref 24–36)

## 2021-08-10 LAB — MAGNESIUM: Magnesium: 2.1 mg/dL (ref 1.7–2.4)

## 2021-08-10 LAB — TROPONIN I (HIGH SENSITIVITY): Troponin I (High Sensitivity): 4125 ng/L (ref <18)

## 2021-08-10 LAB — BRAIN NATRIURETIC PEPTIDE: B Natriuretic Peptide: 320.3 pg/mL — ABNORMAL HIGH (ref 0.0–100.0)

## 2021-08-10 MED ORDER — FUROSEMIDE 10 MG/ML IJ SOLN
40.0000 mg | Freq: Once | INTRAMUSCULAR | Status: AC
  Administered 2021-08-10: 15:00:00 40 mg via INTRAVENOUS
  Filled 2021-08-10: qty 4

## 2021-08-10 MED ORDER — METOPROLOL TARTRATE 25 MG PO TABS
50.0000 mg | ORAL_TABLET | Freq: Two times a day (BID) | ORAL | Status: DC
  Administered 2021-08-10 (×2): 50 mg via ORAL
  Filled 2021-08-10 (×3): qty 2

## 2021-08-10 MED ORDER — NAPROXEN 250 MG PO TABS: 500.0000 mg | ORAL_TABLET | Freq: Two times a day (BID) | ORAL | Status: DC

## 2021-08-10 MED ORDER — POTASSIUM CHLORIDE CRYS ER 20 MEQ PO TBCR
40.0000 meq | EXTENDED_RELEASE_TABLET | ORAL | Status: AC
  Administered 2021-08-10 (×2): 40 meq via ORAL
  Filled 2021-08-10 (×2): qty 2

## 2021-08-10 MED ORDER — FUROSEMIDE 10 MG/ML IJ SOLN
40.0000 mg | Freq: Once | INTRAMUSCULAR | Status: AC
  Administered 2021-08-10: 10:00:00 40 mg via INTRAVENOUS
  Filled 2021-08-10: qty 4

## 2021-08-10 NOTE — Assessment & Plan Note (Addendum)
Recently improved, follow Continue home BP meds, consider adding meds pending trend

## 2021-08-10 NOTE — Progress Notes (Addendum)
PROGRESS NOTE    Nili Honda  MVH:[id number] DOB: 03/16/1956 DOA: 08/04/2021 PCP: [name], MD  Chief Complaint  Patient presents with   generalized weakness    Brief Narrative:  This is Camaya Gannett 65 year old female with anxiety, depression, CAD/CABG, chronic hypoxic respiratory failure related to COPD on 3 L of oxygen at home, type 2 diabetes mellitus, end-stage renal disease status post renal transplant, GERD, obesity, hypertension paroxysmal atrial fibrillation, PAD who presented to the hospital on 12/19 with shortness of breath and generalized weakness.  She was found to have Rico Massar pulse ox of 84% on her usual oxygen, noted to be wheezing and had Brycen Bean cough with yellow sputum.  COVID PCR noted to be positive.  She's been admitted for COVID 19 pneumonia and now found to have MRSA pneumonia.  She's had progressively worsening O2 needs, now on heated high flow.    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  Possible contribution from HF. Uses 2 LPM at home COVID test + on 12/18 CXR 12/24 with worsening severe diffuse bilateral airspace disease CT PE protocol chest 08/05/2021: limited for PE, but negative, extensive heterogenous and GGO throughout lungs  Satting in mid 80's today, on 35 L HHFNC and partial rebreather mask Elevated BNP -> lasix as tolerated Completed remdesivir.  Continue IV steroids for covid and abx to cover for MRSA pneumonia She's immunosuppressed on antirejection meds with renal transplant (not candidate for baricitinib or actemra with MRSA infection and immunosuppression).  Poor prognosis with immunosuppression and covid.  COVID-19 Labs  Recent Labs    08/08/21 0256 08/09/21 0253 08/10/21 0515  DDIMER 1.84* 2.19* 2.01*  CRP 3.9* 3.3*  --     COVID-19 Labs  Recent Labs    08/08/21 0256 08/09/21 0253 08/10/21 0515  DDIMER 1.84* 2.19* 2.01*  CRP 3.9* 3.3*  --     Lab Results  Component Value Date   SARSCOV2NAA POSITIVE ([place]) 08/04/2021   [place] NEGATIVE 05/12/2021   [place] NEGATIVE 04/19/2021   [place] NEGATIVE 03/30/2021     MRSA pneumonia ([place]) On doxycycline, plan for 7 days As above  COVID-19 virus infection- (present on admission) As above Steroids, remdesivir (s/p 5 doses)  Acute CHF (congestive heart failure) (HCC) Elevated BNP Follow echo - EF 45-50%, RWMA (see report) Lasix as tolerated, follow renal function Strict I/O, daily weights  NSTEMI (non-ST elevated myocardial infarction) ([place]) Episode of CP similar to prior heart attacks  on 12/24 Continue nitro prn (resolved after 3rd dose) EKG similar to priors, initial trop negative, troponin continuing to rise Was already anticoagulated on eliquis, antiplatelet on plavix -> given concerning symptoms/rising troponin -> transitioned to heparin gtt, aspirin added EF 45-50%, RWMA's (see report) Trend further troponins Cardiology c/s, appreciate  assistance -> plan for aspirin, plavix, imdur, lopressor, crestor - consider arb/arni if BP remains high, consider cath pending clinical status (note 12/25)  Positive D dimer On anticoagulation Follow LE [place] - negative  Uncontrolled type 2 diabetes mellitus with hyperglycemia, 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 Receives prednisone and Imuran. Placing the patient at high risk for poor outcome secondary to chronic immunosuppressive status  Paroxysmal atrial fibrillation (HCC); CHA2DS2VASc score F, HTN, CAD, CVA = 5- (present on admission) Continue Metoprolol and Eliquis -> now on heparin gtt  Essential hypertension uncontrolled Continue home BP meds, consider adding meds pending trend  CAD S/P percutaneous coronary angioplasty - PCI x 5 to SVG-D1 Prior history.  Monitor. W/u for chest pain above  PAD (peripheral artery disease) ([place])- (present on admission) Prior history.  Monitor.  Chronic diastolic CHF (congestive heart failure) (HCC)- (present on admission) Grade 1 diastolic dysfunction  Pressure injury of skin- (present on admission) Stage II, medial rectal ulcer. Present on admission. Continue dressing changes  OSA (obstructive sleep apnea)- (present on admission) Does not use CPAP at night after lap band.  Hypomagnesemia- (present on admission) Replaced.  Obesity (BMI 30-39.9)- (present on admission) Body mass index is 37.03 kg/m.  Placing the patient at high risk of poor outcome.  Monitor.  GAD (generalized anxiety disorder)- (present on admission) Continue Zoloft and Ativan.  Dyslipidemia, goal LDL below 70- (present on admission) Continue statin.   DVT prophylaxis: heparin gtt (previously eliquis) Code Status: full Family  Communication: none at bedside - discussed with niece 12/24, no answer 12/25 Disposition:   Status is: Inpatient  Remains inpatient appropriate because: sig O2 requirement, NSTEMI       Consultants:  cardiology  Procedures:  echo IMPRESSIONS     1. Left ventricular ejection fraction, by estimation, is 45 to 50%. The  left ventricle has mildly decreased function. The left ventricle  demonstrates regional wall motion abnormalities (see scoring  diagram/findings for description). There is mild left  ventricular hypertrophy. Left ventricular diastolic function could not be  evaluated.   2. Right ventricular systolic function is normal. The right ventricular  size is normal.   3. The mitral valve is grossly normal. Trivial mitral valve  regurgitation.   4. The aortic valve has an indeterminant number of cusps. There is  moderate calcification of the aortic valve. Aortic valve regurgitation is  not visualized. Aortic valve sclerosis/calcification is present, without  any evidence of aortic stenosis.   Comparison(s): Prior images reviewed side by side. Wall motion more  similar to last study with Definity contrast on 07/12/2020, although LVEF  somewhat better.   Summary:  BILATERAL:  -No evidence of popliteal cyst, bilaterally.      Antimicrobials:  Anti-infectives (From admission, onward)    Start     Dose/Rate Route Frequency Ordered Stop   08/08/21 1400  doxycycline (VIBRAMYCIN) 100 mg in sodium chloride 0.9 % 250 mL IVPB        100 mg 125 mL/hr over 120 Minutes Intravenous  Every 12 hours 08/08/21 1327 08/15/21 1414   08/06/21 1000  remdesivir 100 mg in sodium chloride 0.9 % 100 mL IVPB  Status:  Discontinued       See Hyperspace for full Linked Orders Report.   100 mg 200 mL/hr over 30 Minutes Intravenous Daily 08/05/21 0720 08/05/21 0720   08/06/21 1000  remdesivir 100 mg in sodium chloride 0.9 % 100 mL IVPB        100 mg 200 mL/hr over 30 Minutes Intravenous  Daily 08/05/21 0721 08/09/21 0937   08/05/21 1000  remdesivir 100 mg in sodium chloride 0.9 % 100 mL IVPB  Status:  Discontinued       See Hyperspace for full Linked Orders Report.   100 mg 200 mL/hr over 30 Minutes Intravenous Daily 08/04/21 0819 08/05/21 0720   08/05/21 1000  remdesivir 200 mg in sodium chloride 0.9% 250 mL IVPB        200 mg 580 mL/hr over 30 Minutes Intravenous Once 08/05/21 0719 08/05/21 1150   08/05/21 1000  cefTRIAXone (ROCEPHIN) 2 g in sodium chloride 0.9 % 100 mL IVPB        2 g 200 mL/hr over 30 Minutes Intravenous Every 24 hours 08/05/21 0815 08/09/21 1032   08/05/21 1000  azithromycin (ZITHROMAX) 500 mg in sodium chloride 0.9 % 250 mL IVPB  Status:  Discontinued        500 mg 250 mL/hr over 60 Minutes Intravenous Every 24 hours 08/05/21 0815 08/05/21 0907   08/04/21 0930  remdesivir 200 mg in sodium chloride 0.9% 250 mL IVPB  Status:  Discontinued       See Hyperspace for full Linked Orders Report.   200 mg 580 mL/hr over 30 Minutes Intravenous Once 08/04/21 0819 08/05/21 0720       Subjective: CP resolved - feels like Manjot Hinks train hit her  Objective: Vitals:   08/10/21 0600 08/10/21 0730 08/10/21 0754 08/10/21 0800  BP: (!) 149/63   (S) (!) 173/76  Pulse: (!) 53   (!) 58  Resp: 20   19  Temp:   97.9 F (36.6 C)   TempSrc:   Axillary   SpO2: 95% 94%  95%  Weight:      Height:        Intake/Output Summary (Last 24 hours) at 08/10/2021 0935 Last data filed at 08/10/2021 0740 Gross per 24 hour  Intake 839.64 ml  Output 2775 ml  Net -1935.36 ml   Filed Weights   08/08/21 0500 08/09/21 0439 08/10/21 0500  Weight: 93.9 kg 93.1 kg 93.3 kg    Examination:  General: No acute distress. Looks like she's got more energy than yesterday. Cardiovascular: RRR Lungs: Clear to auscultation bilaterally, distant, on HHFNC Abdomen: Soft, nontender, nondistended Neurological: Alert and oriented 3. Moves all extremities 4. Cranial nerves II through XII  grossly intact. Skin: Warm and dry. No rashes or lesions. Extremities: No clubbing or cyanosis. No edema.  Data Reviewed: I have personally reviewed following labs and imaging studies  CBC: Recent Labs  Lab 08/06/21 0252 08/07/21 0254 08/08/21 0256 08/09/21 0253 08/10/21 0515  WBC 8.6 10.7* 11.1* 10.1 9.2  NEUTROABS 8.0* 10.0* 10.4* 9.3* 8.4*  HGB 10.1* 10.8* 10.7* 11.2* 10.6*  HCT 29.8* 32.2* 32.1* 34.7* 32.0*  MCV 95.2 95.3 95.8 98.0 95.5  PLT 218 250 282 275 161    Basic Metabolic Panel: Recent Labs  Lab 08/04/21 0960 08/05/21 0252 08/06/21 0252 08/07/21 0254 08/08/21 0256 08/09/21 0253 08/10/21 0515  NA  --  132* 131* 131* 137 137 138  K  --  4.1 3.5 4.0 3.9 4.1 3.4*  CL  --  101 96* 96* 100 102 99  CO2  --  23 23 25 27 27 31   GLUCOSE  --  176* 254* 281* 255* 204* 119*  BUN  --  15 19 21  26* 34* 38*  CREATININE  --  0.77 0.92 0.91 0.74 0.78 0.77  CALCIUM  --  8.5* 8.6* 8.9 9.0 8.7* 8.4*  MG 1.5* 2.2 1.9 2.1 2.1 2.2 2.1  PHOS 2.7 2.9  --   --   --   --  3.7    GFR: Estimated Creatinine Clearance: 74 mL/min (by C-G formula based on SCr of 0.77 mg/dL).  Liver Function Tests: Recent Labs  Lab 08/06/21 0252 08/07/21 0254 08/08/21 0256 08/09/21 0253 08/10/21 0515  AST 23 29 17 15 27   ALT 10 9 9 10 11   ALKPHOS 85 111 112 117 99  BILITOT 0.5 0.7 0.5 0.6 0.5  PROT 6.5 6.8 6.6 6.4* 5.9*  ALBUMIN 2.8* 2.9* 2.7* 2.6* 2.3*    CBG: Recent Labs  Lab 08/09/21 0812 08/09/21 1123 08/09/21 1540 08/09/21 1659 08/10/21 0759  GLUCAP 206* 233* 181* 149* 115*     Recent Results (from the past 240 hour(s))  Resp Panel by RT-PCR (Flu Bryant Saye&B, Covid) Nasopharyngeal Swab     Status: Abnormal   Collection Time: 08/04/21  3:22 AM   Specimen: Nasopharyngeal Swab; Nasopharyngeal(NP) swabs in vial transport medium  Result Value Ref Range Status   SARS Coronavirus 2 by RT PCR POSITIVE (Haruo Stepanek) NEGATIVE Final    Comment: (NOTE) SARS-CoV-2 target nucleic acids are  DETECTED.  The SARS-CoV-2 RNA is generally detectable in upper respiratory specimens during the acute phase of infection. Positive results are indicative of the presence of the identified virus, but do not rule out bacterial infection or co-infection with other pathogens not detected by the test. Clinical correlation with patient history and other diagnostic information is necessary to determine patient infection status. The expected result is Negative.  Fact Sheet for Patients: EntrepreneurPulse.com.au  Fact Sheet for Healthcare Providers: IncredibleEmployment.be  This test is not yet approved or cleared by the [place] FDA and  has been authorized for detection and/or diagnosis of SARS-CoV-2 by FDA under an Emergency Use Authorization (EUA).  This EUA will remain in effect (meaning this test can be used) for the duration of  the COVID-19 declaration under Section 564(b)(1) of the [place], 21 U.S.C. section 360bbb-3(b)(1), unless the authorization is terminated or revoked sooner.     Influenza Ovide Dusek by PCR NEGATIVE NEGATIVE Final   Influenza B by PCR NEGATIVE NEGATIVE Final    Comment: (NOTE) The Xpert Xpress SARS-CoV-2/FLU/RSV plus assay is intended as an aid in the diagnosis of influenza from Nasopharyngeal swab specimens and should not be used as Jahfari Ambers sole basis for treatment. Nasal washings and aspirates are unacceptable for Xpert Xpress SARS-CoV-2/FLU/RSV testing.  Fact Sheet for Patients: EntrepreneurPulse.com.au  Fact Sheet for Healthcare Providers: IncredibleEmployment.be  This test is not yet approved or cleared by the [place] FDA and has been authorized for detection and/or diagnosis of SARS-CoV-2 by FDA under an Emergency Use Authorization (EUA). This EUA will remain in effect (meaning this test can be used) for the duration of the COVID-19 declaration under Section 564(b)(1) of the Act, 21  U.S.C. section 360bbb-3(b)(1), unless the authorization is terminated or revoked.  Performed at [facility],  [place] [address], [place], [place] [address]   MRSA Next Gen by PCR, Nasal     Status: None   Collection Time: 08/04/21  4:36 PM   Specimen: Nasal Mucosa; Nasal Swab  Result Value Ref Range Status   MRSA by PCR Next Gen NOT DETECTED NOT DETECTED Final    Comment: (NOTE) The GeneXpert MRSA Assay (FDA approved for NASAL specimens only), is one component of Tatiyanna Lashley comprehensive MRSA colonization surveillance program. It is not intended to diagnose MRSA infection nor to guide or monitor treatment for MRSA infections. Test performance is not FDA approved in patients less than 48 years old. Performed at [facility], [place] [address] [place], [place] [address]   Culture, blood (routine x 2) Call MD if unable to obtain prior to antibiotics being given     Status: None (Preliminary result)   Collection Time: 08/05/21 10:12 AM   Specimen: BLOOD LEFT HAND  Result Value Ref Range Status   Specimen Description   Final    BLOOD LEFT HAND Performed at [place] [address], [place], [place] [address]    Special Requests   Final    BOTTLES DRAWN AEROBIC ONLY Blood Culture results may not be optimal due to an inadequate volume of blood received in culture bottles Performed at [place] [address], [place], [place] [address]    Culture   Final    NO GROWTH 4 DAYS Performed at [facility], [place] [address] [place], [place] [address]    Report Status PENDING  Incomplete  Expectorated Sputum Assessment w Gram Stain, Rflx to Resp Cult     Status: None   Collection Time: 08/05/21 10:31 AM   Specimen: Expectorated Sputum  Result Value Ref Range Status   Specimen Description EXPECTORATED SPUTUM  Final   Special Requests NONE  Final   Sputum evaluation   Final    Sputum specimen not acceptable for  testing.  Please recollect.   RESULT CALLED TO, READ BACK BY AND VERIFIED WITH: NOTIFY Joesphine Bare RN ON 07/2021 @ 1104 BY MECIAL J. Performed at [facility], [place] [address] [place], [place] [address]    Report Status 08/05/2021 FINAL  Final  Culture, blood (routine x 2) Call MD if unable to obtain prior to antibiotics being given     Status: None (Preliminary result)   Collection Time: 08/05/21 11:07 AM   Specimen: BLOOD RIGHT HAND  Result Value Ref Range Status   Specimen Description   Final    BLOOD RIGHT HAND Performed at [place] [address] [place], [place] [address]    Special Requests   Final    BOTTLES DRAWN AEROBIC ONLY Blood Culture results may not be optimal due to an inadequate volume of blood received in culture bottles Performed at [place] [address] [place], [place] [address]    Culture   Final    NO GROWTH 4 DAYS Performed at [facility], [place] [address], [place],  [address]    Report Status PENDING  Incomplete  Expectorated Sputum Assessment w Gram Stain, Rflx to Resp Cult     Status: None   Collection Time: 08/05/21  1:29 PM   Specimen: SPU  Result Value Ref Range Status   Specimen Description SPUTUM  Final   Special Requests NONE  Final   Sputum evaluation   Final    THIS SPECIMEN  IS ACCEPTABLE FOR SPUTUM CULTURE Performed at [facility], [place] [address], [place], [place] [address]    Report Status 08/05/2021 FINAL  Final  Culture, Respiratory w Gram Stain     Status: None   Collection Time: 08/05/21  1:29 PM   Specimen: SPU  Result Value Ref Range Status   Specimen Description   Final    SPUTUM Performed at [place] [address] [place], [place] [address]    Special Requests   Final    NONE Reflexed from [id number] Performed at [facility], [place] [address] [place], [place] [address]    Gram Stain    Final    ABUNDANT WBC PRESENT, PREDOMINANTLY MONONUCLEAR MODERATE YEAST FEW GRAM POSITIVE RODS FEW GRAM POSITIVE COCCI Performed at [facility], [place] [address] [place], [place] [address]    Culture   Final    MODERATE METHICILLIN RESISTANT STAPHYLOCOCCUS AUREUS FEW CANDIDA ALBICANS    Report Status 08/08/2021 FINAL  Final   Organism ID, Bacteria METHICILLIN RESISTANT STAPHYLOCOCCUS AUREUS  Final      Susceptibility   Methicillin resistant staphylococcus aureus - MIC*    CIPROFLOXACIN >=8 RESISTANT Resistant     ERYTHROMYCIN >=8 RESISTANT Resistant     GENTAMICIN <=0.5 SENSITIVE Sensitive     OXACILLIN >=4 RESISTANT Resistant     TETRACYCLINE <=1 SENSITIVE Sensitive     VANCOMYCIN <=0.5 SENSITIVE Sensitive     TRIMETH/SULFA <=10 SENSITIVE Sensitive     CLINDAMYCIN >=8 RESISTANT Resistant     RIFAMPIN <=0.5 SENSITIVE Sensitive     Inducible Clindamycin NEGATIVE Sensitive     * MODERATE METHICILLIN RESISTANT STAPHYLOCOCCUS AUREUS         Radiology Studies: DG CHEST PORT 1 VIEW  Result Date: 08/09/2021 CLINICAL DATA:  MRSA pneumonia EXAM: PORTABLE CHEST 1 VIEW COMPARISON:  10/08/2020 FINDINGS: Changes of CABG. Cardiomegaly. Severe diffuse bilateral airspace disease, worsening since prior study. No effusions. No acute bony abnormality. IMPRESSION: Severe diffuse bilateral airspace disease, worsening since prior study. Electronically Signed   By: Rolm Baptise M.D.   On: 08/09/2021 09:21   ECHOCARDIOGRAM COMPLETE  Result Date: 08/09/2021    ECHOCARDIOGRAM REPORT   Patient Name:   AHLIYA GLATT Date of Exam: 08/09/2021 Medical Rec #:  [id number]          Height:       61.0 in Accession #:    [id number]         Weight:       205.2 lb Date of Birth:  [birth year]          BSA:          1.910 m Patient Age:    68 years           BP:           161/81 mmHg Patient Gender: F                  HR:           58 bpm. Exam Location:  Inpatient Procedure: 2D Echo, Cardiac Doppler,  Color Doppler and Intracardiac            Opacification Agent Indications:    Dyspnea  History:        Patient has prior history of Echocardiogram examinations, most                 recent 01/30/2021. Previous Myocardial Infarction and CAD, Prior  CABG, Signs/Symptoms:Dyspnea; Risk Factors:Hypertension,                 Diabetes and Dyslipidemia. Covid positive.  Sonographer:    Merrie Roof RDCS Referring Phys: [place]  1. Left ventricular ejection fraction, by estimation, is 45 to 50%. The left ventricle has mildly decreased function. The left ventricle demonstrates regional wall motion abnormalities (see scoring diagram/findings for description). There is mild left ventricular hypertrophy. Left ventricular diastolic function could not be evaluated.  2. Right ventricular systolic function is normal. The right ventricular size is normal.  3. The mitral valve is grossly normal. Trivial mitral valve regurgitation.  4. The aortic valve has an indeterminant number of cusps. There is moderate calcification of the aortic valve. Aortic valve regurgitation is not visualized. Aortic valve sclerosis/calcification is present, without any evidence of aortic stenosis. Comparison(s): Prior images reviewed side by side. Wall motion more similar to last study with Definity contrast on 07/12/2020, although LVEF somewhat better. FINDINGS  Left Ventricle: Left ventricular ejection fraction, by estimation, is 45 to 50%. The left ventricle has mildly decreased function. The left ventricle demonstrates regional wall motion abnormalities. Definity contrast agent was given IV to delineate the left ventricular endocardial borders. The left ventricular internal cavity size was normal in size. There is mild left ventricular hypertrophy. Left ventricular diastolic function could not be evaluated.  LV Wall Scoring: The apex is dyskinetic. The apical septal segment is hypokinetic. The entire anterior  wall, entire lateral wall, anterior septum, entire inferior wall, mid inferoseptal segment, and basal inferoseptal segment are normal. Right Ventricle: The right ventricular size is normal. No increase in right ventricular wall thickness. Right ventricular systolic function is normal. Left Atrium: Left atrial size was normal in size. Right Atrium: Right atrial size was normal in size. Pericardium: There is no evidence of pericardial effusion. Mitral Valve: The mitral valve is grossly normal. Mild mitral annular calcification. Trivial mitral valve regurgitation. Tricuspid Valve: The tricuspid valve is grossly normal. Tricuspid valve regurgitation is mild. Aortic Valve: The aortic valve has an indeterminant number of cusps. There is moderate calcification of the aortic valve. Aortic valve regurgitation is not visualized. Aortic valve sclerosis/calcification is present, without any evidence of aortic stenosis. Pulmonic Valve: The pulmonic valve was grossly normal. Pulmonic valve regurgitation is trivial. Aorta: The aortic root is normal in size and structure. IAS/Shunts: The interatrial septum was not well visualized.  LEFT VENTRICLE PLAX 2D LVIDd:         4.40 cm LVIDs:         3.30 cm LV PW:         1.00 cm LV IVS:        1.10 cm LVOT diam:     2.00 cm LVOT Area:     3.14 cm  LEFT ATRIUM         Index LA diam:    4.80 cm 2.51 cm/m   AORTA Ao Root diam: 3.00 cm  SHUNTS Systemic Diam: 2.00 cm Rozann Lesches MD Electronically signed by Rozann Lesches MD Signature Date/Time: 08/09/2021/3:43:40 PM    Final    VAS [place] LOWER EXTREMITY VENOUS (DVT)  Result Date: 08/09/2021  Lower Venous DVT Study Patient Name:  LILIANA DANG  Date of Exam:   08/09/2021 Medical Rec #: [id number]           Accession #:    [id number] Date of Birth: 23-May-1956  Patient Gender: F Patient Age:   89 years Exam Location:  [facility] Procedure:      VAS [place] LOWER EXTREMITY VENOUS (DVT) Referring Phys: Lashanta Elbe POWELL JR  --------------------------------------------------------------------------------  Indications: Covid, D-Dimer.  Limitations: On high flow oxygen. Pain with compression and body habitus. Comparison Study: Prior negative bilateral LEV done 01/07/2011 Performing Technologist: Sharion Dove RVS  Examination Guidelines: Shalon Councilman complete evaluation includes B-mode imaging, spectral Doppler, color Doppler, and power Doppler as needed of all accessible portions of each vessel. Bilateral testing is considered an integral part of Rylynne Schicker complete examination. Limited examinations for reoccurring indications may be performed as noted. The reflux portion of the exam is performed with the patient in reverse Trendelenburg.  +---------+---------------+---------+-----------+----------+--------------+  RIGHT     Compressibility Phasicity Spontaneity Properties Thrombus Aging  +---------+---------------+---------+-----------+----------+--------------+  CFV       Full            Yes       Yes                                    +---------+---------------+---------+-----------+----------+--------------+  SFJ       Full                                                             +---------+---------------+---------+-----------+----------+--------------+  FV Prox   Full                                                             +---------+---------------+---------+-----------+----------+--------------+  FV Mid    Full                                                             +---------+---------------+---------+-----------+----------+--------------+  FV Distal Full                                                             +---------+---------------+---------+-----------+----------+--------------+  PFV       Full                                                             +---------+---------------+---------+-----------+----------+--------------+  POP       Full            Yes       Yes                                     +---------+---------------+---------+-----------+----------+--------------+  PTV       Full                                                             +---------+---------------+---------+-----------+----------+--------------+  PERO      Full                                                             +---------+---------------+---------+-----------+----------+--------------+   +---------+---------------+---------+-----------+----------+--------------+  LEFT      Compressibility Phasicity Spontaneity Properties Thrombus Aging  +---------+---------------+---------+-----------+----------+--------------+  CFV       Full            Yes       Yes                                    +---------+---------------+---------+-----------+----------+--------------+  SFJ       Full                                                             +---------+---------------+---------+-----------+----------+--------------+  FV Prox   Full                                                             +---------+---------------+---------+-----------+----------+--------------+  FV Mid    Full                                                             +---------+---------------+---------+-----------+----------+--------------+  FV Distal Full                                                             +---------+---------------+---------+-----------+----------+--------------+  PFV       Full                                                             +---------+---------------+---------+-----------+----------+--------------+  POP       Full            Yes       Yes                                    +---------+---------------+---------+-----------+----------+--------------+  PTV       Full                                                             +---------+---------------+---------+-----------+----------+--------------+  PERO      Full                                                              +---------+---------------+---------+-----------+----------+--------------+    Summary: BILATERAL: -No evidence of popliteal cyst, bilaterally.   *See table(s) above for measurements and observations.    Preliminary         Scheduled Meds:  (feeding supplement) PROSource Plus  30 mL Oral BID BM   arformoterol  15 mcg Nebulization BID   vitamin C  500 mg Oral Daily   aspirin  81 mg Oral Daily   budesonide  2 mL Nebulization BID   calcitRIOL  0.25 mcg Oral Q3 days   Chlorhexidine Gluconate Cloth  6 each Topical Daily   clopidogrel  75 mg Oral Daily   feeding supplement (GLUCERNA SHAKE)  237 mL Oral Q24H   fluticasone  2 spray Each Nare Daily   furosemide  40 mg Intravenous Once   furosemide  40 mg Intravenous Once   guaiFENesin-dextromethorphan  10 mL Oral QID   insulin aspart  0-15 Units Subcutaneous TID WC   insulin aspart  0-5 Units Subcutaneous QHS   insulin aspart  4 Units Subcutaneous TID WC   insulin glargine-yfgn  20 Units Subcutaneous Daily   isosorbide mononitrate  60 mg Oral Daily   lamoTRIgine  25 mg Oral QHS   linagliptin  5 mg Oral Daily   loratadine  10 mg Oral Daily   magic mouthwash  5 mL Oral QID   mouth rinse  15 mL Mouth Rinse BID   methylPREDNISolone (SOLU-MEDROL) injection  100 mg Intravenous Q12H   metoprolol tartrate  50 mg Oral BID   montelukast  10 mg Oral QHS   multivitamin with minerals  1 tablet Oral Daily   pantoprazole  40 mg Oral Daily   Ensure Max Protein  11 oz Oral Daily   rosuvastatin  20 mg Oral Daily   sertraline  100 mg Oral Daily   zinc sulfate  220 mg Oral Daily   Continuous Infusions:  doxycycline (VIBRAMYCIN) IV Stopped (08/10/21 0517)   heparin 900 Units/hr (08/10/21 0740)     LOS: 6 days    Time spent: over 30 min 40 min critical care time with AHRF on HHFNC, NSTEMI  Fayrene Helper, MD [facility]   To contact the attending provider between 7A-7P or the covering provider during after hours 7P-7A, please log into  the web site [email or web address] and access using universal [place] password for that web site. If you do not have the password, please call the hospital operator.  08/10/2021, 9:35 AM

## 2021-08-10 NOTE — Consult Note (Signed)
Cardiology Consultation:   Patient ID: Katherine [name] MRN: [id number]; DOB: [birth year]  Admit date: 08/04/2021 Date of Consult: 08/10/2021  PCP:  Katherine Lima, MD   [facility] Providers Cardiologist:  Katherine Hew, MD        Patient Profile:   Katherine [name] is a 65 y.o. female with a history of CAD s/p CABG and subsequent PCI, R>L carotid dz, DM, HTN, HLD, ESRD s/p renal transplant, GERD, mild AS, OSA on CPAP, COPD, RA, who is being seen 08/10/2021 for the evaluation of CP and abnormal troponins at the request of Dr Katherine [name].  History of Present Illness:   [name] is currently admitted with acute on chronic hypoxic respiratory failure in the setting of COVID-19 and MRSA pneumonia.  She is chronically immunocompromised on suppressive medications with history of renal transplantation.  She has been treated with remdesivir and IV steroids but other medications are limited.  During stay she has experienced intermittent episodes of chest discomfort and high-sensitivity troponin I levels have increased suggestive of NSTEMI.  She was transitioned from Eliquis (taking for PAF) to heparin per hospitalist team.  Follow-up echocardiogram yesterday showed LVEF 45 to 50% with regional wall motion abnormalities similar to previous echocardiogram with Definity done in November 2021 although with LVEF somewhat higher.  Aortic valve with indeterminate number of cusps and moderately calcified without definitive stenosis.  She does not report any chest pain since yesterday.  High-sensitivity troponin I up to 4125, most recent BNP 320.  X-ray from yesterday showed severe bilateral airspace disease with worsening pattern.  ECG shows sinus bradycardia with LVH and diffuse ST-T wave abnormalities.   Past Medical History:  Diagnosis Date   Anemia    Anxiety    Bilateral carotid artery stenosis    Carotid duplex 09/2631: 3-54% LICA, 56-25% RICA, >63% RECA, f/u 1 yr suggested   CAD (coronary  artery disease) of bypass graft 5/01; 3/'02, 8/'03, 10/'04; 1/15   PCI x 5 to SVG-D1    CAD in native artery 07/1993   3 Vessel Disease (LAD-D1 & RCA) -- CABG (Dx in setting of inferior STEMI-PTCA of RCA)   CAD S/P percutaneous coronary angioplasty    PCI to SVG-D1 insertion/native D1 x 4 = '01 -(S660 BMS 2.5 x 9 anastomosis- D1); '02 - distal overlap ACS Pixel 2.5 x 8  BMS; '03 distal/native ISR/Thrombosis - Pixel 2.5 x 13; '04 - ISR-  Taxus 2.5 x 20 (covered all);; 1/15 - mid SVG-D1 (50% distal ISR) - Promus P 2.75 x 20 -- 2.8 mm; 6/22: Extensive ISR PTCA & Anastomotic-Native Diag DES PCI (Frontier Onyx 2.25x12 (2.75-2.5 mm post-dilation   COPD mixed type (HCC)    Followed by Dr. Lamonte [name] "pulmonologist said no COPD"   Depression with anxiety    Diabetes mellitus type 2 in obese ([place])    Diarrhea    started after cholecystectomy and mass removed from intestine   Dyslipidemia, goal LDL below 70    08/2012: TC 137, TG 200, HDL 32!, LDL 45; on statin (followed by Katherine [name])   ESRD (end stage renal disease) ([place]) 1991   s/p Cadaveric Renal Transplant ([facility] - Katherine [name])    Family history of adverse reaction to anesthesia    mom's bp dropped during/after anesthesia   Fibromyalgia    GERD (gastroesophageal reflux disease)    Glomerulonephritis, chronic, rapidly progressive 1989   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 07/1993   Rescue PTCA of RCA -- referred  for CABG.   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  Had having having lots of nausea and vomiting with poor p.o. intake.  Thought to have QT prolongation with multiple medications involved and hypomagnesemia, hypokalemia.  Tikosyn  discontinued along with Zoloft and Phenergan.   Hypertension associated with diabetes ([place])    Mild aortic stenosis by prior echocardiogram 07/2019   Echo:  Mild aortic stenosis (gradients: Mean 14.3 mmHg -peak 24.9 mmHg).   Morbid obesity (HCC)    MRSA (methicillin resistant staph aureus) culture positive    OSA (obstructive sleep apnea)    no longer on CPAP or home O2, states she doesn't need now after lap band   PAD (peripheral artery disease) ([place]) 08/2013   LEA Dopplers to be read by Dr. Fletcher [name]   PAF (paroxysmal atrial fibrillation) ([place]) 06/2014   Noted on CardioNet Monitor  - --> rhythm control with Tikosyn (Dr. Rayann [name]); converted from warfarin to apixaban for anticoagulation.   Pneumonia    Recurrent boils    Bilateral Groin   Rheumatoid arthritis ([place])    Per Patient Report; associated with OA   S/p cadaver renal transplant 1991   [facility]   Torsades de pointes 05/12/2021   Unstable angina ([place]) 5/01; 3/'02, 8/'03, 10/'04; 1/15   x 5 occurences since Inf-Post STEMI in 1994    Past Surgical History:  Procedure Laterality Date   ABDOMINAL AORTOGRAM N/A 04/21/2018   Procedure: ABDOMINAL AORTOGRAM;  Surgeon: Katherine Man, MD;  Location: [place] [facility];  Service: Cardiovascular;  Laterality: N/A;   CATHETER REMOVAL     CHOLECYSTECTOMY N/A 10/29/2014   Procedure: LAPAROSCOPIC CHOLECYSTECTOMY WITH INTRAOPERATIVE CHOLANGIOGRAM;  Surgeon: Katherine Seltzer, MD;  Location: [facility];  Service: General;  Laterality: N/A;   CORONARY ANGIOPLASTY  1994   x5   CORONARY ARTERY BYPASS GRAFT  1995   [name]-LAD, SVG-RPDA, SVG-D1   CORONARY STENT INTERVENTION N/A 02/20/2021   Procedure: PERCUTANEOUS CORONARY STENT INTERVENTION;  Surgeon: Katherine Man, MD;  Location: [place] [facility]; ostLCx 60% (Neg RFR 0.96);; SVG- D2 recurrent 90% ISR & 95% native D2 after graft-> DES PCI of 95% anastomotic D2 lesion (Onyx Frontier 2.25 mm x 12 mm => 2.75 mm @ overlap, 2.5 distal.);PTCA of ISR in body  of graft. ->  2.5 mm scoring balloon & post-dil w/ 2.75 mm [place] balloon   ESOPHAGOGASTRODUODENOSCOPY N/A 10/15/2016   Procedure: ESOPHAGOGASTRODUODENOSCOPY (EGD);  Surgeon: Wilford Corner, MD;  Location: [facility];  Service: Endoscopy;  Laterality: N/A;   I & D EXTREMITY Right 01/29/2018   Procedure: IRRIGATION AND DEBRIDEMENT THUMB;  Surgeon: Dayna Barker, MD;  Location: [place];  Service: Plastics;  Laterality: Right;   INCISE AND DRAIN ABCESS     INTRAVASCULAR PRESSURE WIRE/FFR STUDY N/A 02/20/2021   Procedure: INTRAVASCULAR PRESSURE WIRE/FFR STUDY;  Surgeon: Katherine Man, MD;  Location: [place] [facility];  Service: Cardiovascular;  Laterality: N/A;   KIDNEY TRANSPLANT  1991   KNEE ARTHROSCOPY WITH LATERAL MENISECTOMY Left 12/03/2017   Procedure: LEFT KNEE ARTHROSCOPY WITH LATERAL MENISECTOMY;  Surgeon: Earlie Server, MD;  Location: [place];  Service: Orthopedics;  Laterality: Left;  LAPAROSCOPIC GASTRIC BANDING  04/2004; 10/'09, 2/'10   Port Replacement x 2   LEFT HEART CATH AND CORONARY ANGIOGRAPHY N/A 02/20/2021   Procedure: LEFT HEART CATH AND CORONARY ANGIOGRAPHY;  Surgeon: Katherine Man, MD;  Location: [place] [facility];  Service: Cardiovascular;  Laterality: N/A;   LEFT HEART CATH AND CORS/GRAFTS ANGIOGRAPHY N/A 04/21/2018   Procedure: LEFT HEART CATH AND CORS/GRAFTS ANGIOGRAPHY;  Surgeon: Katherine Man, MD;  Location: [place] [facility];  Ost-Prox LAD 50% - proxLAD (pre & post D1) 100% CTO. Cx - patent, small OM1 (stable ~ ostial OM1 90%, too small for PCI) & 2 small LPL; Ost-distal RCA 100% CTO.  [name]-LAD (not injected); SVG-dRCA patent, SVG-D1 - insertion stent ~20% ISR - Severe R CFA disease w/ focal Sub TO   LEFT HEART CATH AND CORS/GRAFTS ANGIOGRAPHY  5/'01, 3/'02, 8/'03, 10/'04; 1/'15   08/22/2013: LAD & RCA 100%; [name]-LAD & SVG-rPDA patent; Cx-- OM1 60%, OM2 ostial ~50%; SVG-D1 - 80% mid, 50% distal ISR --PCI   LEFT HEART CATH AND CORS/GRAFTS ANGIOGRAPHY N/A  01/31/2021   Procedure: LEFT HEART CATH AND CORS/GRAFTS ANGIOGRAPHY;  Surgeon: Jolaine Artist, MD;  Location: [facility];;   LEFT HEART CATHETERIZATION WITH CORONARY/GRAFT ANGIOGRAM N/A 08/23/2013   Procedure: LEFT HEART CATHETERIZATION WITH Beatrix Fetters;  Surgeon: Wellington Hampshire, MD;  Location: [place] [facility];  Service: Cardiovascular;  Laterality: N/A;   Lower Extremity Arterial Dopplers  08/2013   ABI: R 0.96, L 1.04   MULTIPLE TOOTH EXTRACTIONS  age 14   NM MYOVIEW LTD  03/2016   EF 62%. LOW RISK. C/W prior MI - no Ischemia. Apical hypokinesis.   PERCUTANEOUS CORONARY STENT INTERVENTION (PCI-S)  5/'01, 3/'02, 8/'03, 10/'04;   '01 - S660 BMS 2.5 x 9 - dSVG-D1 into D1; '02- post-stent stenosis - 2.5 x 8 Pixel BMS; '8\03: ISR/Thrombosis into native D1 - AngioJet, 2.5 x 13 Pixel; '04 - ISR 95% - covered stented area with Taxus DES 2.5 mm x 20 (2.88)   PERCUTANEOUS CORONARY STENT INTERVENTION (PCI-S)  08/23/2013   Procedure: PERCUTANEOUS CORONARY STENT INTERVENTION (PCI-S);  Surgeon: Wellington Hampshire, MD;  [facility] C[facility];  Service: Cardiovascular;;mid SVG-D1 80%; distal stent ~50% ISR; Promus [place] DES 2.75 mm xc 20 mm (2.8 mm)   PORT-A-CATH REMOVAL     kidney   TRANSTHORACIC ECHOCARDIOGRAM  07/2019   a) 07/2019: EF 55 to 60%.  No LVH.  Paradoxical septal WM-s/p CABG.  GRII DD.  Nl RV size and fxn.  Mild bilateral atrial dilation.  Mod MAC.  Trace MR.  Mild AS (gradients: Mean 14.3 mmHg -peak 24.9 mmHg).; B) 06/2020: EF 40 to 45%.  Moderate concentric LVH.  GRII DD.  Elevated LAP.  Mod HK mid Apical Ant-AntSept wall & mild Apical Dyskinesis.  Mod LA dilation.  Mild MR.  AoV sclerosis w/o AS.   TRANSTHORACIC ECHOCARDIOGRAM  01/30/2021   EF 55 to 60%.  Mild LVH.  GR 1 DD.  Elevated LAP.  Moderate LA dilation.  Mild MR with mild MS.  Mild aortic valve stenosis.:   TUBAL LIGATION     wrist fistula repair Left    dialysis for one year    Inpatient  Medications: Scheduled Meds:  (feeding supplement) PROSource Plus  30 mL Oral BID BM   arformoterol  15 mcg Nebulization BID   vitamin C  500 mg Oral Daily   aspirin  81 mg Oral Daily   budesonide  2  mL Nebulization BID   calcitRIOL  0.25 mcg Oral Q3 days   Chlorhexidine Gluconate Cloth  6 each Topical Daily   clopidogrel  75 mg Oral Daily   feeding supplement (GLUCERNA SHAKE)  237 mL Oral Q24H   fluticasone  2 spray Each Nare Daily   guaiFENesin-dextromethorphan  10 mL Oral QID   insulin aspart  0-15 Units Subcutaneous TID WC   insulin aspart  0-5 Units Subcutaneous QHS   insulin aspart  4 Units Subcutaneous TID WC   insulin glargine-yfgn  20 Units Subcutaneous Daily   isosorbide mononitrate  60 mg Oral Daily   lamoTRIgine  25 mg Oral QHS   linagliptin  5 mg Oral Daily   loratadine  10 mg Oral Daily   magic mouthwash  5 mL Oral QID   mouth rinse  15 mL Mouth Rinse BID   methylPREDNISolone (SOLU-MEDROL) injection  100 mg Intravenous Q12H   metoprolol tartrate  50 mg Oral BID   montelukast  10 mg Oral QHS   multivitamin with minerals  1 tablet Oral Daily   pantoprazole  40 mg Oral Daily   Ensure Max Protein  11 oz Oral Daily   rosuvastatin  20 mg Oral Daily   sertraline  100 mg Oral Daily   zinc sulfate  220 mg Oral Daily   Continuous Infusions:  doxycycline (VIBRAMYCIN) IV Stopped (08/10/21 0517)   heparin 900 Units/hr (08/10/21 0740)   PRN Meds: acetaminophen **OR** acetaminophen, cyclobenzaprine, ipratropium-albuterol, LORazepam, morphine injection, nitroGLYCERIN, ondansetron (ZOFRAN) IV, oxyCODONE, promethazine, sodium chloride  Allergies:    Allergies  Allergen Reactions   Tetracycline Hives    Patient tolerated Doxycycline Dec 2020   Niacin Other (See Comments)    Mouth blisters   Niaspan [Niacin Er] Other (See Comments)    Mouth blisters   Sulfa Antibiotics Nausea Only and Other (See Comments)    "Tears up stomach"   Sulfonamide Derivatives Other (See  Comments)    Reaction: per patient "tears her stomach up"   Codeine Nausea And Vomiting   Erythromycin Nausea And Vomiting   Hydromorphone Hcl Nausea And Vomiting   Morphine And Related Nausea And Vomiting   Nalbuphine Nausea And Vomiting    Nubain   Sulfasalazine Nausea Only and Other (See Comments)    per patient "tears her stomach up", "Tears up stomach"   Tape Rash and Other (See Comments)    No "plastic" tape," please----cloth tape only    Social History:   Social History   Tobacco Use   Smoking status: Every Day    Packs/day: 1.00    Years: 30.00    Pack years: 30.00    Types: Cigarettes    Last attempt to quit: 08/17/2002    Years since quitting: 18.9   Smokeless tobacco: Never  Substance Use Topics   Alcohol use: No     Family History:   Family History  Problem Relation Age of Onset   Cancer Mother        liver   Heart disease Father    Cancer Father        colon   Arrhythmia Brother        Atrial Fibrillation   Arrhythmia Paternal Aunt        Atrial Fibrillation     ROS:  Please see the history of present illness.  All other ROS reviewed and negative.     Physical Exam/Data:   Vitals:   08/10/21 0600 08/10/21 0730 08/10/21 0754 08/10/21  0800  BP: (!) 149/63   (S) (!) 173/76  Pulse: (!) 53   (!) 58  Resp: 20   19  Temp:   97.9 F (36.6 C)   TempSrc:   Axillary   SpO2: 95% 94%  95%  Weight:      Height:        Intake/Output Summary (Last 24 hours) at 08/10/2021 0830 Last data filed at 08/10/2021 0740 Gross per 24 hour  Intake 839.64 ml  Output 3125 ml  Net -2285.36 ml   Last 3 Weights 08/10/2021 08/09/2021 08/08/2021  Weight (lbs) 205 lb 11 oz 205 lb 4 oz 207 lb 0.2 oz  Weight (kg) 93.3 kg 93.1 kg 93.9 kg     Body mass index is 38.86 kg/m.  General: Chronically ill-appearing woman. HEENT: Conjunctiva and lids normal, receiving supplemental oxygen via facemask. Neck: Supple, no elevated JVP or carotid bruits, no thyromegaly. Lungs:  Diffuse crackles and rhonchi, mildly labored at rest. Cardiac: Regular rate and rhythm, no S3, 1/6 systolic murmur, no pericardial rub. Abdomen: Soft, nontender, bowel sounds present. Extremities: No pitting edema, distal pulses 2+. Skin: Warm and dry. Musculoskeletal: No kyphosis. Neuropsychiatric: Alert and oriented x3, affect grossly appropriate.  EKG: I personally reviewed her ECG from 08/09/2021 which shows sinus bradycardia with LVH and diffuse ST-T wave abnormalities. Telemetry:  Telemetry was personally reviewed and demonstrates: Sinus rhythm with occasional PVCs.  Relevant CV Studies:  Echocardiogram 08/09/2021:  1. Left ventricular ejection fraction, by estimation, is 45 to 50%. The  left ventricle has mildly decreased function. The left ventricle  demonstrates regional wall motion abnormalities (see scoring  diagram/findings for description). There is mild left  ventricular hypertrophy. Left ventricular diastolic function could not be  evaluated.   2. Right ventricular systolic function is normal. The right ventricular  size is normal.   3. The mitral valve is grossly normal. Trivial mitral valve  regurgitation.   4. The aortic valve has an indeterminant number of cusps. There is  moderate calcification of the aortic valve. Aortic valve regurgitation is  not visualized. Aortic valve sclerosis/calcification is present, without  any evidence of aortic stenosis.   Comparison(s): Prior images reviewed side by side. Wall motion more  similar to last study with Definity contrast on 07/12/2020, although LVEF  somewhat better.    Laboratory Data:  High Sensitivity Troponin:   Recent Labs  Lab 08/09/21 0921 08/09/21 1117 08/09/21 1251 08/09/21 1541 08/10/21 0515  TROPONINIHS 16 150* 928* 2,835* 4,125*     Chemistry Recent Labs  Lab 08/08/21 0256 08/09/21 0253 08/10/21 0515  NA 137 137 138  K 3.9 4.1 3.4*  CL 100 102 99  CO2 _0 GLUCOSE 255* 204* 119*   BUN 26* 34* 38*  CREATININE 0.74 0.78 0.77  CALCIUM 9.0 8.7* 8.4*  MG 2.1 2.2 2.1  GFRNONAA >60 >60 >60  ANIONGAP _1 Recent Labs  Lab 08/08/21 0256 08/09/21 0253 08/10/21 0515  PROT 6.6 6.4* 5.9*  ALBUMIN 2.7* 2.6* 2.3*  AST _2 ALT _3 ALKPHOS 112 117 99  BILITOT 0.5 0.6 0.5   Hematology Recent Labs  Lab 08/08/21 0256 08/09/21 0253 08/10/21 0515  WBC 11.1* 10.1 9.2  RBC 3.35* 3.54* 3.35*  HGB 10.7* 11.2* 10.6*  HCT 32.1* 34.7* 32.0*  MCV 95.8 98.0 95.5  MCH 31.9 31.6 31.6  MCHC 33.3 32.3 33.1  RDW 15.2 15.3 15.3  PLT 282 275 315  BNP Recent Labs  Lab 08/04/21 0332 08/09/21 0700 08/10/21 0515  BNP 189.6* 551.0* 320.3*    DDimer  Recent Labs  Lab 08/08/21 0256 08/09/21 0253 08/10/21 0515  DDIMER 1.84* 2.19* 2.01*     Radiology/Studies:  DG CHEST PORT 1 VIEW  Result Date: 08/09/2021 CLINICAL DATA:  MRSA pneumonia EXAM: PORTABLE CHEST 1 VIEW COMPARISON:  10/08/2020 FINDINGS: Changes of CABG. Cardiomegaly. Severe diffuse bilateral airspace disease, worsening since prior study. No effusions. No acute bony abnormality. IMPRESSION: Severe diffuse bilateral airspace disease, worsening since prior study. Electronically Signed   By: Rolm Baptise M.D.   On: 08/09/2021 09:21   DG CHEST PORT 1 VIEW  Result Date: 08/07/2021 CLINICAL DATA:  Shortness of breath, COVID-19 positivity, initial encounter EXAM: PORTABLE CHEST 1 VIEW COMPARISON:  08/05/2021 FINDINGS: Cardiac shadow is stable. Lungs are well aerated bilaterally. Patchy airspace opacity is noted slightly improved when compare with the prior exam. No new focal abnormality is noted. IMPRESSION: Patchy airspace opacities improved in the interval from the prior study. Electronically Signed   By: Inez Catalina M.D.   On: 08/07/2021 22:35   ECHOCARDIOGRAM COMPLETE  Result Date: 08/09/2021    ECHOCARDIOGRAM REPORT   Patient Name:   PESSY DELAMAR Date of Exam: 08/09/2021 Medical Rec #:   [id number]          Height:       61.0 in Accession #:    [id number]         Weight:       205.2 lb Date of Birth:  Jan 08, 1956          BSA:          1.910 m Patient Age:    54 years           BP:           161/81 mmHg Patient Gender: F                  HR:           58 bpm. Exam Location:  Inpatient Procedure: 2D Echo, Cardiac Doppler, Color Doppler and Intracardiac            Opacification Agent Indications:    Dyspnea  History:        Patient has prior history of Echocardiogram examinations, most                 recent 01/30/2021. Previous Myocardial Infarction and CAD, Prior                 CABG, Signs/Symptoms:Dyspnea; Risk Factors:Hypertension,                 Diabetes and Dyslipidemia. Covid positive.  Sonographer:    Merrie Roof RDCS Referring Phys: [place]  1. Left ventricular ejection fraction, by estimation, is 45 to 50%. The left ventricle has mildly decreased function. The left ventricle demonstrates regional wall motion abnormalities (see scoring diagram/findings for description). There is mild left ventricular hypertrophy. Left ventricular diastolic function could not be evaluated.  2. Right ventricular systolic function is normal. The right ventricular size is normal.  3. The mitral valve is grossly normal. Trivial mitral valve regurgitation.  4. The aortic valve has an indeterminant number of cusps. There is moderate calcification of the aortic valve. Aortic valve regurgitation is not visualized. Aortic valve sclerosis/calcification is present, without any evidence of aortic stenosis. Comparison(s): Prior images reviewed side by side. Wall motion  more similar to last study with Definity contrast on 07/12/2020, although LVEF somewhat better. FINDINGS  Left Ventricle: Left ventricular ejection fraction, by estimation, is 45 to 50%. The left ventricle has mildly decreased function. The left ventricle demonstrates regional wall motion abnormalities. Definity contrast agent  was given IV to delineate the left ventricular endocardial borders. The left ventricular internal cavity size was normal in size. There is mild left ventricular hypertrophy. Left ventricular diastolic function could not be evaluated.  LV Wall Scoring: The apex is dyskinetic. The apical septal segment is hypokinetic. The entire anterior wall, entire lateral wall, anterior septum, entire inferior wall, mid inferoseptal segment, and basal inferoseptal segment are normal. Right Ventricle: The right ventricular size is normal. No increase in right ventricular wall thickness. Right ventricular systolic function is normal. Left Atrium: Left atrial size was normal in size. Right Atrium: Right atrial size was normal in size. Pericardium: There is no evidence of pericardial effusion. Mitral Valve: The mitral valve is grossly normal. Mild mitral annular calcification. Trivial mitral valve regurgitation. Tricuspid Valve: The tricuspid valve is grossly normal. Tricuspid valve regurgitation is mild. Aortic Valve: The aortic valve has an indeterminant number of cusps. There is moderate calcification of the aortic valve. Aortic valve regurgitation is not visualized. Aortic valve sclerosis/calcification is present, without any evidence of aortic stenosis. Pulmonic Valve: The pulmonic valve was grossly normal. Pulmonic valve regurgitation is trivial. Aorta: The aortic root is normal in size and structure. IAS/Shunts: The interatrial septum was not well visualized.  LEFT VENTRICLE PLAX 2D LVIDd:         4.40 cm LVIDs:         3.30 cm LV PW:         1.00 cm LV IVS:        1.10 cm LVOT diam:     2.00 cm LVOT Area:     3.14 cm  LEFT ATRIUM         Index LA diam:    4.80 cm 2.51 cm/m   AORTA Ao Root diam: 3.00 cm  SHUNTS Systemic Diam: 2.00 cm Rozann Lesches MD Electronically signed by Rozann Lesches MD Signature Date/Time: 08/09/2021/3:43:40 PM    Final    VAS [place] LOWER EXTREMITY VENOUS (DVT)  Result Date: 08/09/2021  Lower  Venous DVT Study Patient Name:  JAMAYAH MYSZKA  Date of Exam:   08/09/2021 Medical Rec #: [id number]           Accession #:    [id number] Date of Birth: 12/03/1955           Patient Gender: F Patient Age:   37 years Exam Location:  [facility] Procedure:      VAS [place] LOWER EXTREMITY VENOUS (DVT) Referring Phys: A POWELL JR --------------------------------------------------------------------------------  Indications: Covid, D-Dimer.  Limitations: On high flow oxygen. Pain with compression and body habitus. Comparison Study: Prior negative bilateral LEV done 01/07/2011 Performing Technologist: Sharion Dove RVS  Examination Guidelines: A complete evaluation includes B-mode imaging, spectral Doppler, color Doppler, and power Doppler as needed of all accessible portions of each vessel. Bilateral testing is considered an integral part of a complete examination. Limited examinations for reoccurring indications may be performed as noted. The reflux portion of the exam is performed with the patient in reverse Trendelenburg.  +---------+---------------+---------+-----------+----------+--------------+  RIGHT     Compressibility Phasicity Spontaneity Properties Thrombus Aging  +---------+---------------+---------+-----------+----------+--------------+  CFV       Full  Yes       Yes                                    +---------+---------------+---------+-----------+----------+--------------+  SFJ       Full                                                             +---------+---------------+---------+-----------+----------+--------------+  FV Prox   Full                                                             +---------+---------------+---------+-----------+----------+--------------+  FV Mid    Full                                                             +---------+---------------+---------+-----------+----------+--------------+  FV Distal Full                                                              +---------+---------------+---------+-----------+----------+--------------+  PFV       Full                                                             +---------+---------------+---------+-----------+----------+--------------+  POP       Full            Yes       Yes                                    +---------+---------------+---------+-----------+----------+--------------+  PTV       Full                                                             +---------+---------------+---------+-----------+----------+--------------+  PERO      Full                                                             +---------+---------------+---------+-----------+----------+--------------+   +---------+---------------+---------+-----------+----------+--------------+  LEFT      Compressibility Phasicity  Spontaneity Properties Thrombus Aging  +---------+---------------+---------+-----------+----------+--------------+  CFV       Full            Yes       Yes                                    +---------+---------------+---------+-----------+----------+--------------+  SFJ       Full                                                             +---------+---------------+---------+-----------+----------+--------------+  FV Prox   Full                                                             +---------+---------------+---------+-----------+----------+--------------+  FV Mid    Full                                                             +---------+---------------+---------+-----------+----------+--------------+  FV Distal Full                                                             +---------+---------------+---------+-----------+----------+--------------+  PFV       Full                                                             +---------+---------------+---------+-----------+----------+--------------+  POP       Full            Yes       Yes                                     +---------+---------------+---------+-----------+----------+--------------+  PTV       Full                                                             +---------+---------------+---------+-----------+----------+--------------+  PERO      Full                                                             +---------+---------------+---------+-----------+----------+--------------+  Summary: BILATERAL: -No evidence of popliteal cyst, bilaterally.   *See table(s) above for measurements and observations.    Preliminary      Assessment and Plan:   1.  Recurrent anginal chest pain and enzymatic evidence of NSTEMI with high-sensitivity troponin I up to 4125.  ECG shows LVH with diffuse ST and T wave abnormalities.  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  2.  Paroxysmal atrial fibrillation with CHA2DS2-VASc score of 6.  She has been converted from Eliquis to heparin in the setting of NSTEMI and is in sinus rhythm at this time.  3.  Component of acute on chronic systolic heart failure.  4.  Acute on chronic hypoxic respiratory failure with COPD at baseline on supplemental oxygen.  5.  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  Present creatinine 0.77.  Discussed case with hospitalist team.  Given overall medical complexity and current presentation with COVID-19 and MRSA pneumonia in the setting of immunosuppressive state, she has a fairly poor prognosis.  Continues on supportive measures at this time with worsening chest x-ray.  I agree with efforts at diuresis for now, will give a dose of IV Lasix today.  Continue aspirin, Plavix, Imdur, Lopressor, and Crestor.  Consider adding ARB or ANRI particularly if blood pressure trend stays up.  Cycle  additional cardiac enzymes and recheck ECG in the morning.  A follow-up cardiac catheterization can be considered depending on her overall clinical status and progress.  For questions or updates, please contact [place] Please consult [email or web address] for contact info under    Signed, Rozann Lesches, MD  08/10/2021 8:30 AM

## 2021-08-10 NOTE — Progress Notes (Signed)
ANTICOAGULATION CONSULT NOTE   Pharmacy Consult for Heparin Indication: chest pain/ACS  Allergies  Allergen Reactions   Tetracycline Hives    Patient tolerated Doxycycline Dec 2020   Niacin Other (See Comments)    Mouth blisters   Niaspan [Niacin Er] Other (See Comments)    Mouth blisters   Sulfa Antibiotics Nausea Only and Other (See Comments)    "Tears up stomach"   Sulfonamide Derivatives Other (See Comments)    Reaction: per patient "tears her stomach up"   Codeine Nausea And Vomiting   Erythromycin Nausea And Vomiting   Hydromorphone Hcl Nausea And Vomiting   Morphine And Related Nausea And Vomiting   Nalbuphine Nausea And Vomiting    Nubain   Sulfasalazine Nausea Only and Other (See Comments)    per patient "tears her stomach up", "Tears up stomach"   Tape Rash and Other (See Comments)    No "plastic" tape," please----cloth tape only   Patient Measurements: Height: 5\' 1"  (154.9 cm) Weight: 93.3 kg (205 lb 11 oz) IBW/kg (Calculated) : 47.8 Heparin Dosing Weight: 66kg  Vital Signs: Temp: 98.5 F (36.9 C) (12/25 0400) Temp Source: Axillary (12/25 0400) BP: 149/63 (12/25 0600) Pulse Rate: 53 (12/25 0600)  Labs: Recent Labs    08/08/21 0256 08/09/21 0253 08/09/21 0921 08/09/21 1251 08/09/21 1541 08/09/21 2050 08/10/21 0515  HGB 10.7* 11.2*  --   --   --   --  10.6*  HCT 32.1* 34.7*  --   --   --   --  32.0*  PLT 282 275  --   --   --   --  315  APTT  --   --   --   --   --  25 74*  HEPARINUNFRC  --   --   --   --   --  >1.10*  --   CREATININE 0.74 0.78  --   --   --   --  0.77  TROPONINIHS  --   --    < > 928* 2,835*  --  4,125*   < > = values in this interval not displayed.     Estimated Creatinine Clearance: 74 mL/min (by C-G formula based on SCr of 0.77 mg/dL).   Medical History: Past Medical History:  Diagnosis Date   Anemia    Anxiety    Bilateral carotid artery stenosis    Carotid duplex 0/1601: 0-93% LICA, 23-55% RICA, >73% RECA, f/u 1 yr  suggested   CAD (coronary artery disease) of bypass graft 5/01; 3/'02, 8/'03, 10/'04; 1/15   PCI x 5 to SVG-D1    CAD in native artery 07/1993   3 Vessel Disease (LAD-D1 & RCA) -- CABG (Dx in setting of inferior STEMI-PTCA of RCA)   CAD S/P percutaneous coronary angioplasty    PCI to SVG-D1 insertion/native D1 x 4 = '01 -(S660 BMS 2.5 x 9 anastomosis- D1); '02 - distal overlap ACS Pixel 2.5 x 8  BMS; '03 distal/native ISR/Thrombosis - Pixel 2.5 x 13; '04 - ISR-  Taxus 2.5 x 20 (covered all);; 1/15 - mid SVG-D1 (50% distal ISR) - Promus P 2.75 x 20 -- 2.8 mm; 6/22: Extensive ISR PTCA & Anastomotic-Native Diag DES PCI ([place] Onyx 2.25x12 (2.75-2.5 mm post-dilation   COPD mixed type ([place])    Followed by Dr. Lamonte Sakai "pulmonologist said no COPD"   Depression with anxiety    Diabetes mellitus type 2 in obese ([place])    Diarrhea    started after  cholecystectomy and mass removed from intestine   Dyslipidemia, goal LDL below 70    08/2012: TC 137, TG 200, HDL 32!, LDL 45; on statin (followed by Dr.Deterding)   ESRD (end stage renal disease) ([place]) 1991   s/p Cadaveric Renal Transplant [facility] - Dr. Jimmy Footman)    Family history of adverse reaction to anesthesia    mom's bp dropped during/after anesthesia   Fibromyalgia    GERD (gastroesophageal reflux disease)    Glomerulonephritis, chronic, rapidly progressive 1989   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 07/1993   Rescue PTCA of RCA -- referred for CABG.   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  Had having having lots of nausea and vomiting with poor p.o. intake.  Thought to have QT prolongation with multiple medications involved and hypomagnesemia,  hypokalemia.  Tikosyn discontinued along with Zoloft and Phenergan.   Hypertension associated with diabetes ([place])    Mild aortic stenosis by prior echocardiogram 07/2019   Echo:  Mild aortic stenosis (gradients: Mean 14.3 mmHg -peak 24.9 mmHg).   Morbid obesity ([facility])    MRSA (methicillin resistant staph aureus) culture positive    OSA (obstructive sleep apnea)    no longer on CPAP or home O2, states she doesn't need now after lap band   PAD (peripheral artery disease) ([place]) 08/2013   LEA Dopplers to be read by Dr. Fletcher Anon   PAF (paroxysmal atrial fibrillation) ([place]) 06/2014   Noted on CardioNet Monitor  - --> rhythm control with Tikosyn (Dr. Rayann Heman); converted from warfarin to apixaban for anticoagulation.   Pneumonia    Recurrent boils    Bilateral Groin   Rheumatoid arthritis ([place])    Per Patient Report; associated with OA   S/p cadaver renal transplant 1991   [facility]   Torsades de pointes 05/12/2021   Unstable angina ([place]) 5/01; 3/'02, 8/'03, 10/'04; 1/15   x 5 occurences since Inf-Post STEMI in 1994   Medications:   Infusions:   doxycycline (VIBRAMYCIN) IV Stopped (08/10/21 0701)   heparin 900 Units/hr (08/09/21 2057)    Assessment: 58 yoF, Covid +, requiring high O2 administration. Currently on Doxycycline for MRSA PNA, high dose SoluMedrol, completed Remdesivir. Complaint of chest pain, Troponin elevated: d/c Apixaban 5mg  bid for Afib > Heparin infusion. Last dose Apixaban 12/24 at 0900 Note that Apixaban will artificially elevate Hep levels, will dose based on aPTT until both levels correlate  12/24: Baseline aPTT 25 sec, Hep level > 1.1 as expected Troponins 150 > 2800 > 4100 ASA added per Cards, continuing Plavix  Goal of Therapy:  Heparin level 0.3-0.7 units/ml aPTT 66-102 seconds Monitor platelets by anticoagulation protocol: Yes  Today, 08/10/2021 0515 aPTT 74 sec, within desired range Hgb sl decr, Plt wnl   Plan:  Continue Heparin infusion at 900 units/hr   Recheck aPTT in 8hr Daily Hep level & CBC starting 12/26  Minda Ditto PharmD 08/10/2021,7:28 AM

## 2021-08-10 NOTE — Progress Notes (Signed)
ANTICOAGULATION CONSULT NOTE - Follow Up Consult  Pharmacy Consult for Heparin Indication: chest pain/ACS  Allergies  Allergen Reactions   Tetracycline Hives    Patient tolerated Doxycycline Dec 2020   Niacin Other (See Comments)    Mouth blisters   Niaspan [Niacin Er] Other (See Comments)    Mouth blisters   Sulfa Antibiotics Nausea Only and Other (See Comments)    "Tears up stomach"   Sulfonamide Derivatives Other (See Comments)    Reaction: per patient "tears her stomach up"   Codeine Nausea And Vomiting   Erythromycin Nausea And Vomiting   Hydromorphone Hcl Nausea And Vomiting   Morphine And Related Nausea And Vomiting   Nalbuphine Nausea And Vomiting    Nubain   Sulfasalazine Nausea Only and Other (See Comments)    per patient "tears her stomach up", "Tears up stomach"   Tape Rash and Other (See Comments)    No "plastic" tape," please----cloth tape only    Patient Measurements: Height: 5\' 1"  (154.9 cm) Weight: 93.3 kg (205 lb 11 oz) IBW/kg (Calculated) : 47.8 Heparin Dosing Weight: 66kg  Vital Signs: Temp: 99.5 F (37.5 C) (12/25 1140) Temp Source: Axillary (12/25 1140) BP: 130/66 (12/25 1200) Pulse Rate: 70 (12/25 1458)  Labs: Recent Labs    08/08/21 0256 08/09/21 0253 08/09/21 0921 08/09/21 1251 08/09/21 1541 08/09/21 2050 08/10/21 0515 08/10/21 1400  HGB 10.7* 11.2*  --   --   --   --  10.6*  --   HCT 32.1* 34.7*  --   --   --   --  32.0*  --   PLT 282 275  --   --   --   --  315  --   APTT  --   --   --   --   --  25 74* 92*  HEPARINUNFRC  --   --   --   --   --  >1.10*  --   --   CREATININE 0.74 0.78  --   --   --   --  0.77  --   TROPONINIHS  --   --    < > 928* 2,835*  --  4,125*  --    < > = values in this interval not displayed.     Medications:  Infusions:   doxycycline (VIBRAMYCIN) IV 100 mg (08/10/21 1326)   heparin 900 Units/hr (08/10/21 1220)    Assessment: 65 yoF, Covid +, requiring high O2 administration. Complaint of chest  pain, Troponin elevated.  PTA Apixaban for Afib transitioned to heparin infusion in the setting of NSTEMI.   - Last dose Apixaban 12/24 at 0900.  Note that Apixaban will artificially elevate Hep levels, will dose based on aPTT until both levels correlate Troponin: 16, 150, 928, 2835, 4125 Heparin level falsely elevated due to recent apixaban.  APTT increased to 92, remain therapeutic on heparin 900 units/hr No IV interruptions or complications.  No bleeding reported.    Goal of Therapy:  Heparin level 0.3-0.7 units/ml aPTT 66-102 seconds Monitor platelets by anticoagulation protocol: Yes   Plan:  Continue heparin IV infusion at 900 units/hr Daily APTT, heparin level, and CBC Continue to monitor H&H and platelets    Gretta Arab PharmD, BCPS Clinical Pharmacist [facility] main pharmacy [phone] 08/10/2021 3:07 PM

## 2021-08-11 ENCOUNTER — Inpatient Hospital Stay (HOSPITAL_COMMUNITY): Payer: Medicare Other

## 2021-08-11 DIAGNOSIS — J9621 Acute and chronic respiratory failure with hypoxia: Secondary | ICD-10-CM | POA: Diagnosis not present

## 2021-08-11 DIAGNOSIS — U071 COVID-19: Secondary | ICD-10-CM | POA: Diagnosis not present

## 2021-08-11 DIAGNOSIS — I5043 Acute on chronic combined systolic (congestive) and diastolic (congestive) heart failure: Secondary | ICD-10-CM | POA: Diagnosis not present

## 2021-08-11 DIAGNOSIS — I214 Non-ST elevation (NSTEMI) myocardial infarction: Secondary | ICD-10-CM | POA: Diagnosis not present

## 2021-08-11 DIAGNOSIS — J1282 Pneumonia due to coronavirus disease 2019: Secondary | ICD-10-CM | POA: Diagnosis not present

## 2021-08-11 LAB — TROPONIN I (HIGH SENSITIVITY)
Troponin I (High Sensitivity): 1660 ng/L (ref <18)
Troponin I (High Sensitivity): 1171 ng/L (ref <18)
Troponin I (High Sensitivity): 877 ng/L (ref <18)

## 2021-08-11 LAB — CBC WITH DIFFERENTIAL/PLATELET
Abs Immature Granulocytes: 0.1 10*3/uL — ABNORMAL HIGH (ref 0.00–0.07)
Basophils Absolute: 0 10*3/uL (ref 0.0–0.1)
Basophils Relative: 0 %
Eosinophils Absolute: 0 10*3/uL (ref 0.0–0.5)
Eosinophils Relative: 0 %
HCT: 35 % — ABNORMAL LOW (ref 36.0–46.0)
Hemoglobin: 11.6 g/dL — ABNORMAL LOW (ref 12.0–15.0)
Immature Granulocytes: 1 %
Lymphocytes Relative: 2 %
Lymphs Abs: 0.1 10*3/uL — ABNORMAL LOW (ref 0.7–4.0)
MCH: 31.9 pg (ref 26.0–34.0)
MCHC: 33.1 g/dL (ref 30.0–36.0)
MCV: 96.2 fL (ref 80.0–100.0)
Monocytes Absolute: 0.2 10*3/uL (ref 0.1–1.0)
Monocytes Relative: 3 %
Neutro Abs: 7.9 10*3/uL — ABNORMAL HIGH (ref 1.7–7.7)
Neutrophils Relative %: 94 %
Platelets: 332 10*3/uL (ref 150–400)
RBC: 3.64 MIL/uL — ABNORMAL LOW (ref 3.87–5.11)
RDW: 15.5 % (ref 11.5–15.5)
WBC: 8.4 10*3/uL (ref 4.0–10.5)
nRBC: 0.2 % (ref 0.0–0.2)

## 2021-08-11 LAB — PHOSPHORUS: Phosphorus: 3.1 mg/dL (ref 2.5–4.6)

## 2021-08-11 LAB — GLUCOSE, CAPILLARY
Glucose-Capillary: 244 mg/dL — ABNORMAL HIGH (ref 70–99)
Glucose-Capillary: 288 mg/dL — ABNORMAL HIGH (ref 70–99)
Glucose-Capillary: 404 mg/dL — ABNORMAL HIGH (ref 70–99)
Glucose-Capillary: 229 mg/dL — ABNORMAL HIGH (ref 70–99)

## 2021-08-11 LAB — COMPREHENSIVE METABOLIC PANEL
ALT: 13 U/L (ref 0–44)
AST: 23 U/L (ref 15–41)
Albumin: 2.6 g/dL — ABNORMAL LOW (ref 3.5–5.0)
Alkaline Phosphatase: 115 U/L (ref 38–126)
Anion gap: 9 (ref 5–15)
BUN: 41 mg/dL — ABNORMAL HIGH (ref 8–23)
CO2: 30 mmol/L (ref 22–32)
Calcium: 8.7 mg/dL — ABNORMAL LOW (ref 8.9–10.3)
Chloride: 98 mmol/L (ref 98–111)
Creatinine, Ser: 0.87 mg/dL (ref 0.44–1.00)
GFR, Estimated: >60 mL/min (ref >60)
Glucose, Bld: 211 mg/dL — ABNORMAL HIGH (ref 70–99)
Potassium: 4.3 mmol/L (ref 3.5–5.1)
Sodium: 137 mmol/L (ref 135–145)
Total Bilirubin: 0.6 mg/dL (ref 0.3–1.2)
Total Protein: 6.4 g/dL — ABNORMAL LOW (ref 6.5–8.1)

## 2021-08-11 LAB — HEPARIN LEVEL (UNFRACTIONATED): Heparin Unfractionated: >1.1 IU/mL — ABNORMAL HIGH (ref 0.30–0.70)

## 2021-08-11 LAB — C-REACTIVE PROTEIN: CRP: 2.4 mg/dL — ABNORMAL HIGH (ref <1.0)

## 2021-08-11 LAB — APTT
aPTT: 105 seconds — ABNORMAL HIGH (ref 24–36)
aPTT: 115 seconds — ABNORMAL HIGH (ref 24–36)

## 2021-08-11 LAB — MAGNESIUM: Magnesium: 2.2 mg/dL (ref 1.7–2.4)

## 2021-08-11 LAB — D-DIMER, QUANTITATIVE: D-Dimer, Quant: 1.43 ug/mL-FEU — ABNORMAL HIGH (ref 0.00–0.50)

## 2021-08-11 LAB — BRAIN NATRIURETIC PEPTIDE: B Natriuretic Peptide: 202.5 pg/mL — ABNORMAL HIGH (ref 0.0–100.0)

## 2021-08-11 MED ORDER — INSULIN ASPART 100 UNIT/ML IJ SOLN
0.0000 [IU] | Freq: Three times a day (TID) | INTRAMUSCULAR | Status: DC
  Administered 2021-08-12 (×2): 15 [IU] via SUBCUTANEOUS
  Administered 2021-08-12: 08:00:00 11 [IU] via SUBCUTANEOUS
  Administered 2021-08-13: 18:00:00 7 [IU] via SUBCUTANEOUS
  Administered 2021-08-13: 08:00:00 4 [IU] via SUBCUTANEOUS
  Administered 2021-08-13: 12:00:00 15 [IU] via SUBCUTANEOUS
  Administered 2021-08-14: 09:00:00 4 [IU] via SUBCUTANEOUS
  Administered 2021-08-14 (×2): 15 [IU] via SUBCUTANEOUS

## 2021-08-11 MED ORDER — INSULIN ASPART 100 UNIT/ML IJ SOLN
0.0000 [IU] | Freq: Every day | INTRAMUSCULAR | Status: DC
  Administered 2021-08-11 – 2021-08-12 (×2): 2 [IU] via SUBCUTANEOUS
  Administered 2021-08-15: 22:00:00 5 [IU] via SUBCUTANEOUS

## 2021-08-11 MED ORDER — ISOSORBIDE MONONITRATE ER 60 MG PO TB24
30.0000 mg | ORAL_TABLET | Freq: Once | ORAL | Status: DC
Start: 2021-08-11 — End: 2021-08-11

## 2021-08-11 MED ORDER — METHYLPREDNISOLONE SODIUM SUCC 125 MG IJ SOLR
50.0000 mg | Freq: Two times a day (BID) | INTRAMUSCULAR | Status: DC
  Administered 2021-08-11 – 2021-08-12 (×2): 50 mg via INTRAVENOUS
  Filled 2021-08-11: qty 2

## 2021-08-11 MED ORDER — FUROSEMIDE 10 MG/ML IJ SOLN
40.0000 mg | Freq: Once | INTRAMUSCULAR | Status: AC
  Administered 2021-08-11: 09:00:00 40 mg via INTRAVENOUS
  Filled 2021-08-11: qty 4

## 2021-08-11 MED ORDER — ISOSORBIDE MONONITRATE ER 60 MG PO TB24
30.0000 mg | ORAL_TABLET | Freq: Once | ORAL | Status: AC
  Administered 2021-08-11: 14:00:00 30 mg via ORAL
  Filled 2021-08-11: qty 1

## 2021-08-11 MED ORDER — LOSARTAN POTASSIUM 50 MG PO TABS
25.0000 mg | ORAL_TABLET | Freq: Every day | ORAL | Status: DC
  Administered 2021-08-11: 11:00:00 25 mg via ORAL
  Filled 2021-08-11: qty 1

## 2021-08-11 MED ORDER — INSULIN GLARGINE-YFGN 100 UNIT/ML ~~LOC~~ SOLN
23.0000 [IU] | Freq: Every day | SUBCUTANEOUS | Status: DC
  Administered 2021-08-12: 10:00:00 23 [IU] via SUBCUTANEOUS
  Filled 2021-08-11: qty 0.23

## 2021-08-11 MED ORDER — ISOSORBIDE MONONITRATE ER 60 MG PO TB24
90.0000 mg | ORAL_TABLET | Freq: Every day | ORAL | Status: DC
  Administered 2021-08-12 – 2021-08-18 (×7): 90 mg via ORAL
  Filled 2021-08-11 (×8): qty 2

## 2021-08-11 MED ORDER — ATROPINE SULFATE 1 MG/10ML IJ SOSY
PREFILLED_SYRINGE | INTRAMUSCULAR | Status: AC
  Filled 2021-08-11: qty 10

## 2021-08-11 MED ORDER — HYDRALAZINE HCL 25 MG PO TABS
25.0000 mg | ORAL_TABLET | Freq: Three times a day (TID) | ORAL | Status: DC | PRN
  Filled 2021-08-11: qty 1

## 2021-08-11 MED ORDER — INSULIN ASPART 100 UNIT/ML IJ SOLN
6.0000 [IU] | Freq: Three times a day (TID) | INTRAMUSCULAR | Status: DC
Start: 2021-08-12 — End: 2021-08-12
  Administered 2021-08-12 (×2): 6 [IU] via SUBCUTANEOUS

## 2021-08-11 MED ORDER — POTASSIUM CHLORIDE CRYS ER 20 MEQ PO TBCR
40.0000 meq | EXTENDED_RELEASE_TABLET | Freq: Once | ORAL | Status: AC
  Administered 2021-08-11: 16:00:00 40 meq via ORAL
  Filled 2021-08-11: qty 2

## 2021-08-11 MED ORDER — INSULIN ASPART 100 UNIT/ML IJ SOLN
20.0000 [IU] | Freq: Once | INTRAMUSCULAR | Status: AC
  Administered 2021-08-11: 18:00:00 20 [IU] via SUBCUTANEOUS

## 2021-08-11 MED ORDER — FUROSEMIDE 10 MG/ML IJ SOLN
40.0000 mg | Freq: Once | INTRAMUSCULAR | Status: AC
  Administered 2021-08-11: 16:00:00 40 mg via INTRAVENOUS
  Filled 2021-08-11: qty 4

## 2021-08-11 MED ORDER — METOPROLOL TARTRATE 25 MG PO TABS
25.0000 mg | ORAL_TABLET | Freq: Two times a day (BID) | ORAL | Status: DC
  Administered 2021-08-11 – 2021-08-19 (×13): 25 mg via ORAL
  Filled 2021-08-11 (×14): qty 1

## 2021-08-11 MED ORDER — LORAZEPAM 1 MG PO TABS
1.0000 mg | ORAL_TABLET | Freq: Three times a day (TID) | ORAL | Status: DC | PRN
  Administered 2021-08-11 – 2021-08-15 (×8): 1 mg via ORAL
  Filled 2021-08-11 (×8): qty 1

## 2021-08-11 NOTE — Progress Notes (Signed)
PT Cancellation Note  Patient Details Name: Katherine [name] MRN: [id number] DOB: 1955-12-24   Cancelled Treatment:     PT deferred this date - RN advises pt with chest pain and elevated troponin.  Will follow.   [name] 08/11/2021, 12:57 PM

## 2021-08-11 NOTE — Progress Notes (Signed)
Pt started c/o 4/10 chest pain. Described as similar to pain that she had a few days ago. SBP in 180s. HR in 50s. MD Florene Glen notified. Orders received.  PRN nitro given and EKG completed. Chest pain resolved with 3 doses of nitro. Will continue to monitor.

## 2021-08-11 NOTE — Progress Notes (Signed)
PROGRESS NOTE    Katherine [name]  WIO:[id number] DOB: 05-10-1956 DOA: 08/04/2021 PCP: [name], MD  Chief Complaint  Patient presents with   generalized weakness    Brief Narrative:  This is Katherine [name] 65 year old female with anxiety, depression, CAD/CABG, chronic hypoxic respiratory failure related to COPD on 3 L of oxygen at home, type 2 diabetes mellitus, end-stage renal disease status post renal transplant, GERD, obesity, hypertension paroxysmal atrial fibrillation, PAD who presented to the hospital on 12/19 with shortness of breath and generalized weakness.  She was found to have Katherine [name] pulse ox of 84% on her usual oxygen, noted to be wheezing and had Katherine [name] cough with yellow sputum.  COVID PCR noted to be positive.  She's been admitted for COVID 19 pneumonia and now found to have MRSA pneumonia.  She's had progressively worsening O2 needs, now on heated high flow.    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  Possible contribution from HF. Uses 2 LPM at home COVID test + on 12/18 CXR 12/24 with worsening severe diffuse bilateral airspace disease CT PE protocol chest 08/05/2021: limited for PE, but negative, extensive heterogenous and GGO throughout lungs  Satting in 90's, on 35 L HHFNC and partial rebreather mask Elevated BNP -> lasix as tolerated Completed remdesivir.  Continue IV steroids for covid and abx to cover for MRSA pneumonia She's immunosuppressed on antirejection meds with renal transplant (not candidate for baricitinib or actemra with MRSA infection and immunosuppression).  Poor prognosis with immunosuppression and covid.  COVID-19 Labs  Recent Labs    08/09/21 0253 08/10/21 0515 08/11/21 0309  DDIMER 2.19* 2.01* 1.43*  CRP 3.3*  --  2.4*    COVID-19 Labs  Recent Labs    08/09/21 0253 08/10/21 0515 08/11/21 0309  DDIMER 2.19* 2.01* 1.43*  CRP 3.3*  --  2.4*    Lab Results  Component Value Date   SARSCOV2NAA POSITIVE (Katherine [name]) 08/04/2021   [place] NEGATIVE 05/12/2021   [place] NEGATIVE 04/19/2021   [place] NEGATIVE 03/30/2021     MRSA pneumonia ([place]) On doxycycline, plan for 7 days As above  COVID-19 virus infection- (present on admission) As above Steroids, remdesivir (s/p 5 doses)  Acute CHF (congestive heart failure) (HCC) Elevated BNP Follow echo - EF 45-50%, RWMA (see report) Lasix as tolerated, follow renal function Strict I/O, daily weights  NSTEMI (non-ST elevated myocardial infarction) ([place]) Episode of CP similar to prior heart attacks on 12/24  CP again today (12/26), resolved with nitro -> EKG stable, trops continuing to downtrend Continue nitro prn (resolved after 3rd dose) Was already anticoagulated on eliquis, antiplatelet on plavix -> given concerning symptoms/rising troponin -> transitioned to heparin gtt, aspirin added EF 45-50%, RWMA's (see report) Trend further troponins Cardiology c/s,  appreciate assistance -> plan for aspirin, plavix, imdur (increase dose), lopressor, crestor - consider arb/arni if BP remains high, consider cath pending clinical status (note 12/25)  Positive D dimer On anticoagulation Follow LE [place] - negative  Uncontrolled type 2 diabetes mellitus with hyperglycemia, 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 Receives prednisone and Imuran. Placing the patient at high risk for poor outcome secondary to chronic immunosuppressive status  Paroxysmal atrial fibrillation (HCC); CHA2DS2VASc score F, HTN, CAD, CVA = 5- (present on admission) Continue Metoprolol and Eliquis -> now on heparin gtt  Essential hypertension uncontrolled Continue home BP meds, consider adding meds pending trend  CAD S/P percutaneous coronary angioplasty - PCI x 5 to SVG-D1 Prior history.  Monitor. W/u for chest pain above  PAD (peripheral artery disease) ([place])- (present on admission) Prior history.  Monitor.  Chronic diastolic CHF (congestive heart failure) (HCC)- (present on admission) Grade 1 diastolic dysfunction  Pressure injury of skin- (present on admission) Stage II, medial rectal ulcer. Present on admission. Continue dressing changes  OSA (obstructive sleep apnea)- (present on admission) Does not use CPAP at night after lap band.  Hypomagnesemia- (present on admission) Replaced.  Obesity (BMI 30-39.9)- (present on admission) Body mass index is 37.03 kg/m.  Placing the patient at high risk of poor outcome.  Monitor.  GAD (generalized anxiety disorder)- (present on admission) Continue Zoloft and Ativan.  Dyslipidemia, goal LDL below 70- (present on admission) Continue statin.   DVT prophylaxis: heparin  gtt (previously eliquis) Code Status: full Family Communication: none at bedside - discussed with niece 12/24, no answer 12/25 Disposition:   Status is: Inpatient  Remains inpatient appropriate because: sig O2 requirement, NSTEMI       Consultants:  cardiology  Procedures:  echo IMPRESSIONS     1. Left ventricular ejection fraction, by estimation, is 45 to 50%. The  left ventricle has mildly decreased function. The left ventricle  demonstrates regional wall motion abnormalities (see scoring  diagram/findings for description). There is mild left  ventricular hypertrophy. Left ventricular diastolic function could not be  evaluated.   2. Right ventricular systolic function is normal. The right ventricular  size is normal.   3. The mitral valve is grossly normal. Trivial mitral valve  regurgitation.   4. The aortic valve has an indeterminant number of cusps. There is  moderate calcification of the aortic valve. Aortic valve regurgitation is  not visualized. Aortic valve sclerosis/calcification is present, without  any evidence of aortic stenosis.   Comparison(s): Prior images reviewed side by side. Wall motion more  similar to last study with Definity contrast on 07/12/2020, although LVEF  somewhat better.   Summary:  BILATERAL:  -No evidence of popliteal cyst, bilaterally.      Antimicrobials:  Anti-infectives (From admission, onward)    Start     Dose/Rate Route Frequency Ordered Stop   08/08/21 1400  doxycycline (VIBRAMYCIN) 100 mg in sodium chloride 0.9 % 250 mL IVPB  100 mg 125 mL/hr over 120 Minutes Intravenous Every 12 hours 08/08/21 1327 08/15/21 1414   08/06/21 1000  remdesivir 100 mg in sodium chloride 0.9 % 100 mL IVPB  Status:  Discontinued       See Hyperspace for full Linked Orders Report.   100 mg 200 mL/hr over 30 Minutes Intravenous Daily 08/05/21 0720 08/05/21 0720   08/06/21 1000  remdesivir 100 mg in sodium chloride 0.9 % 100 mL IVPB         100 mg 200 mL/hr over 30 Minutes Intravenous Daily 08/05/21 0721 08/09/21 0937   08/05/21 1000  remdesivir 100 mg in sodium chloride 0.9 % 100 mL IVPB  Status:  Discontinued       See Hyperspace for full Linked Orders Report.   100 mg 200 mL/hr over 30 Minutes Intravenous Daily 08/04/21 0819 08/05/21 0720   08/05/21 1000  remdesivir 200 mg in sodium chloride 0.9% 250 mL IVPB        200 mg 580 mL/hr over 30 Minutes Intravenous Once 08/05/21 0719 08/05/21 1150   08/05/21 1000  cefTRIAXone (ROCEPHIN) 2 g in sodium chloride 0.9 % 100 mL IVPB        2 g 200 mL/hr over 30 Minutes Intravenous Every 24 hours 08/05/21 0815 08/09/21 1032   08/05/21 1000  azithromycin (ZITHROMAX) 500 mg in sodium chloride 0.9 % 250 mL IVPB  Status:  Discontinued        500 mg 250 mL/hr over 60 Minutes Intravenous Every 24 hours 08/05/21 0815 08/05/21 0907   08/04/21 0930  remdesivir 200 mg in sodium chloride 0.9% 250 mL IVPB  Status:  Discontinued       See Hyperspace for full Linked Orders Report.   200 mg 580 mL/hr over 30 Minutes Intravenous Once 08/04/21 0819 08/05/21 0720       Subjective: Feels Katherine [name] little better today I discussed we have long road with significant O2 requirement.    Objective: Vitals:   08/11/21 0803 08/11/21 0804 08/11/21 1200 08/11/21 1516  BP:      Pulse:    84  Resp:    17  Temp:  98.3 F (36.8 C) 97.6 F (36.4 C)   TempSrc:  Oral Oral   SpO2: 95% 93%  93%  Weight:      Height:        Intake/Output Summary (Last 24 hours) at 08/11/2021 1545 Last data filed at 08/11/2021 1416 Gross per 24 hour  Intake 487.77 ml  Output 2200 ml  Net -1712.23 ml   Filed Weights   08/09/21 0439 08/10/21 0500 08/11/21 0500  Weight: 93.1 kg 93.3 kg 93.5 kg    Examination:  General: No acute distress. Cardiovascular:RRR Lungs: distant with isolation scope, no clear adventitious lung sounds appreciated Abdomen: Soft, nontender, nondistended Neurological: Alert and oriented 3.  Moves all extremities 4 . Cranial nerves II through XII grossly intact. Skin: Warm and dry. No rashes or lesions. Extremities: No clubbing or cyanosis. No edema.  Data Reviewed: I have personally reviewed following labs and imaging studies  CBC: Recent Labs  Lab 08/07/21 0254 08/08/21 0256 08/09/21 0253 08/10/21 0515 08/11/21 0309  WBC 10.7* 11.1* 10.1 9.2 8.4  NEUTROABS 10.0* 10.4* 9.3* 8.4* 7.9*  HGB 10.8* 10.7* 11.2* 10.6* 11.6*  HCT 32.2* 32.1* 34.7* 32.0* 35.0*  MCV 95.3 95.8 98.0 95.5 96.2  PLT 250 282 275 315 503    Basic Metabolic Panel: Recent Labs  Lab 08/05/21 0252 08/06/21 0252 08/07/21 0254  08/08/21 0256 08/09/21 0253 08/10/21 0515 08/11/21 0309  NA 132*   < > 131* 137 137 138 137  K 4.1   < > 4.0 3.9 4.1 3.4* 4.3  CL 101   < > 96* 100 102 99 98  CO2 23   < > 25 27 27 31 30   GLUCOSE 176*   < > 281* 255* 204* 119* 211*  BUN 15   < > 21 26* 34* 38* 41*  CREATININE 0.77   < > 0.91 0.74 0.78 0.77 0.87  CALCIUM 8.5*   < > 8.9 9.0 8.7* 8.4* 8.7*  MG 2.2   < > 2.1 2.1 2.2 2.1 2.2  PHOS 2.9  --   --   --   --  3.7 3.1   < > = values in this interval not displayed.    GFR: Estimated Creatinine Clearance: 68.2 mL/min (by C-G formula based on SCr of 0.87 mg/dL).  Liver Function Tests: Recent Labs  Lab 08/07/21 0254 08/08/21 0256 08/09/21 0253 08/10/21 0515 08/11/21 0309  AST 29 17 15 27 23   ALT 9 9 10 11 13   ALKPHOS 111 112 117 99 115  BILITOT 0.7 0.5 0.6 0.5 0.6  PROT 6.8 6.6 6.4* 5.9* 6.4*  ALBUMIN 2.9* 2.7* 2.6* 2.3* 2.6*    CBG: Recent Labs  Lab 08/10/21 1137 08/10/21 1756 08/10/21 2159 08/11/21 0842 08/11/21 1321  GLUCAP 137* 242* 238* 244* 288*     Recent Results (from the past 240 hour(s))  Resp Panel by RT-PCR (Flu Katherine [name]&B, Covid) Nasopharyngeal Swab     Status: Abnormal   Collection Time: 08/04/21  3:22 AM   Specimen: Nasopharyngeal Swab; Nasopharyngeal(NP) swabs in vial transport medium  Result Value Ref Range Status   SARS  Coronavirus 2 by RT PCR POSITIVE (Katherine [name]) NEGATIVE Final    Comment: (NOTE) SARS-CoV-2 target nucleic acids are DETECTED.  The SARS-CoV-2 RNA is generally detectable in upper respiratory specimens during the acute phase of infection. Positive results are indicative of the presence of the identified virus, but do not rule out bacterial infection or co-infection with other pathogens not detected by the test. Clinical correlation with patient history and other diagnostic information is necessary to determine patient infection status. The expected result is Negative.  Fact Sheet for Patients: EntrepreneurPulse.com.au  Fact Sheet for Healthcare Providers: IncredibleEmployment.be  This test is not yet approved or cleared by the [place] FDA and  has been authorized for detection and/or diagnosis of SARS-CoV-2 by FDA under an Emergency Use Authorization (EUA).  This EUA will remain in effect (meaning this test can be used) for the duration of  the COVID-19 declaration under Section 564(b)(1) of the [place] ct, 21 U.S.C. section 360bbb-3(b)(1), unless the authorization is terminated or revoked sooner.     Influenza Lorita Forinash by PCR NEGATIVE NEGATIVE Final   Influenza B by PCR NEGATIVE NEGATIVE Final    Comment: (NOTE) The Xpert Xpress SARS-CoV-2/FLU/RSV plus assay is intended as an aid in the diagnosis of influenza from Nasopharyngeal swab specimens and should not be used as Katherine [name] sole basis for treatment. Nasal washings and aspirates are unacceptable for Xpert Xpress SARS-CoV-2/FLU/RSV testing.  Fact Sheet for Patients: EntrepreneurPulse.com.au  Fact Sheet for Healthcare Providers: IncredibleEmployment.be  This test is not yet approved or cleared by the [place] FDA and has been authorized for detection and/or diagnosis of SARS-CoV-2 by FDA under an Emergency Use Authorization (EUA). This EUA will remain in effect (meaning  this test can be used)  for the duration of the COVID-19 declaration under Section 564(b)(1) of the Act, 21 U.S.C. section 360bbb-3(b)(1), unless the authorization is terminated or revoked.  Performed at [facility], [place] [address] [place], [place] [address]   MRSA Next Gen by PCR, Nasal     Status: None   Collection Time: 08/04/21  4:36 PM   Specimen: Nasal Mucosa; Nasal Swab  Result Value Ref Range Status   MRSA by PCR Next Gen NOT DETECTED NOT DETECTED Final    Comment: (NOTE) The GeneXpert MRSA Assay (FDA approved for NASAL specimens only), is one component of Katherine [name] comprehensive MRSA colonization surveillance program. It is not intended to diagnose MRSA infection nor to guide or monitor treatment for MRSA infections. Test performance is not FDA approved in patients less than 89 years old. Performed at [facility], [place] [address], [place], [place] [address]   Culture, blood (routine x 2) Call MD if unable to obtain prior to antibiotics being given     Status: None   Collection Time: 08/05/21 10:12 AM   Specimen: BLOOD LEFT HAND  Result Value Ref Range Status   Specimen Description   Final    BLOOD LEFT HAND Performed at [place] [address] [place], [place] [address]    Special Requests   Final    BOTTLES DRAWN AEROBIC ONLY Blood Culture results may not be optimal due to an inadequate volume of blood received in culture bottles Performed at [facility] [address] [place], [place] [address]    Culture   Final    NO GROWTH 5 DAYS Performed at [facility], [place] [address] [place], [place] [address]    Report Status 08/10/2021 FINAL  Final  Expectorated Sputum Assessment w Gram Stain, Rflx to Resp Cult     Status: None   Collection Time: 08/05/21 10:31 AM   Specimen: Expectorated Sputum  Result Value Ref Range Status   Specimen Description EXPECTORATED SPUTUM  Final   Special  Requests NONE  Final   Sputum evaluation   Final    Sputum specimen not acceptable for testing.  Please recollect.   RESULT CALLED TO, READ BACK BY AND VERIFIED WITH: NOTIFY Katherine Bare RN ON 07/2021 @ 1104 BY Katherine J. Performed at [facility], [place] [address], [place], [place] [address]    Report Status 08/05/2021 FINAL  Final  Culture, blood (routine x 2) Call MD if unable to obtain prior to antibiotics being given     Status: None   Collection Time: 08/05/21 11:07 AM   Specimen: BLOOD RIGHT HAND  Result Value Ref Range Status   Specimen Description   Final    BLOOD RIGHT HAND Performed at [place] [address], [place], [place] [address]    Special Requests   Final    BOTTLES DRAWN AEROBIC ONLY Blood Culture results may not be optimal due to an inadequate volume of blood received in culture bottles Performed at [place] [address], [place], [place] [address]    Culture   Final    NO GROWTH 5 DAYS Performed at [facility], [place] [address] [place], [place] [address]    Report Status 08/10/2021 FINAL  Final  Expectorated Sputum Assessment w Gram Stain, Rflx to Resp Cult     Status: None   Collection Time: 08/05/21  1:29 PM   Specimen: SPU  Result Value  Ref Range Status   Specimen Description SPUTUM  Final   Special Requests NONE  Final   Sputum evaluation   Final    THIS SPECIMEN IS ACCEPTABLE FOR SPUTUM CULTURE Performed at [facility], [place] [address] [place], [place] [address]    Report Status 08/05/2021 FINAL  Final  Culture, Respiratory w Gram Stain     Status: None   Collection Time: 08/05/21  1:29 PM   Specimen: SPU  Result Value Ref Range Status   Specimen Description   Final    SPUTUM Performed at [place] [address] [place], [place] [address]    Special Requests   Final    NONE Reflexed from [id number] Performed at [facility], [place] [address] [place], [place] [address]    Gram Stain   Final    ABUNDANT WBC PRESENT, PREDOMINANTLY MONONUCLEAR MODERATE YEAST FEW GRAM POSITIVE RODS FEW GRAM POSITIVE COCCI Performed at [facility], [place] [address] [place],  [address]    Culture   Final    MODERATE METHICILLIN RESISTANT STAPHYLOCOCCUS AUREUS FEW CANDIDA ALBICANS    Report Status 08/08/2021 FINAL  Final   Organism ID, Bacteria METHICILLIN RESISTANT STAPHYLOCOCCUS AUREUS  Final      Susceptibility   Methicillin resistant staphylococcus aureus - MIC*    CIPROFLOXACIN >=8 RESISTANT Resistant     ERYTHROMYCIN >=8 RESISTANT Resistant     GENTAMICIN <=0.5 SENSITIVE Sensitive     OXACILLIN >=4 RESISTANT Resistant     TETRACYCLINE <=1 SENSITIVE Sensitive     VANCOMYCIN <=0.5 SENSITIVE Sensitive     TRIMETH/SULFA <=10 SENSITIVE Sensitive     CLINDAMYCIN >=8 RESISTANT Resistant     RIFAMPIN <=0.5 SENSITIVE Sensitive     Inducible Clindamycin NEGATIVE Sensitive     * MODERATE METHICILLIN RESISTANT STAPHYLOCOCCUS AUREUS         Radiology Studies: DG CHEST PORT 1 VIEW  Result Date: 08/11/2021 CLINICAL DATA:  Respiratory failure EXAM: PORTABLE CHEST 1 VIEW COMPARISON:  08/09/2021 FINDINGS: Multifocal patchy opacities with relative sparing of the left upper lobe, reflecting multifocal pneumonia, grossly unchanged. No pleural effusion or pneumothorax. Mild cardiomegaly.  Postsurgical changes related to prior CABG. Median sternotomy. IMPRESSION: Multifocal pneumonia, grossly unchanged. Electronically Signed   By: Julian Hy M.D.   On: 08/11/2021 06:57        Scheduled Meds:  (feeding supplement) PROSource Plus  30 mL Oral BID BM   arformoterol  15 mcg Nebulization BID   vitamin C  500 mg Oral Daily   aspirin  81 mg Oral Daily   budesonide  2 mL Nebulization BID   calcitRIOL  0.25 mcg Oral Q3 days   Chlorhexidine Gluconate Cloth  6 each Topical Daily   clopidogrel  75 mg Oral  Daily   feeding supplement (GLUCERNA SHAKE)  237 mL Oral Q24H   fluticasone  2 spray Each Nare Daily   furosemide  40 mg Intravenous Once   guaiFENesin-dextromethorphan  10 mL Oral QID   insulin aspart  0-15 Units Subcutaneous TID WC   insulin aspart  0-5 Units Subcutaneous QHS   insulin aspart  4 Units Subcutaneous TID WC   [START ON 08/12/2021] insulin glargine-yfgn  23 Units Subcutaneous Daily   [START ON 08/12/2021] isosorbide mononitrate  90 mg Oral Daily   lamoTRIgine  25 mg Oral QHS   linagliptin  5 mg Oral Daily   loratadine  10 mg Oral Daily   magic  mouthwash  5 mL Oral QID   mouth rinse  15 mL Mouth Rinse BID   methylPREDNISolone (SOLU-MEDROL) injection  100 mg Intravenous Q12H   metoprolol tartrate  25 mg Oral BID   montelukast  10 mg Oral QHS   multivitamin with minerals  1 tablet Oral Daily   pantoprazole  40 mg Oral Daily   potassium chloride  40 mEq Oral Once   Ensure Max Protein  11 oz Oral Daily   rosuvastatin  20 mg Oral Daily   sertraline  100 mg Oral Daily   zinc sulfate  220 mg Oral Daily   Continuous Infusions:  doxycycline (VIBRAMYCIN) IV 100 mg (08/11/21 1333)   heparin 850 Units/hr (08/11/21 0838)     LOS: 7 days    Time spent: over 30 min 40 min critical care time with AHRF on HHFNC  Fayrene Helper, MD [facility]   To contact the attending provider between 7A-7P or the covering provider during after hours 7P-7A, please log into the web site [email or web address] and access using universal [place] password for that web site. If you do not have the password, please call the hospital operator.  08/11/2021, 3:45 PM

## 2021-08-11 NOTE — Progress Notes (Addendum)
ANTICOAGULATION CONSULT NOTE - Follow Up Consult  Pharmacy Consult for Heparin Indication: chest pain/ACS  Allergies  Allergen Reactions   Tetracycline Hives    Patient tolerated Doxycycline Dec 2020   Niacin Other (See Comments)    Mouth blisters   Niaspan [Niacin Er] Other (See Comments)    Mouth blisters   Sulfa Antibiotics Nausea Only and Other (See Comments)    "Tears up stomach"   Sulfonamide Derivatives Other (See Comments)    Reaction: per patient "tears her stomach up"   Codeine Nausea And Vomiting   Erythromycin Nausea And Vomiting   Hydromorphone Hcl Nausea And Vomiting   Morphine And Related Nausea And Vomiting   Nalbuphine Nausea And Vomiting    Nubain   Sulfasalazine Nausea Only and Other (See Comments)    per patient "tears her stomach up", "Tears up stomach"   Tape Rash and Other (See Comments)    No "plastic" tape," please----cloth tape only    Patient Measurements: Height: 5\' 1"  (154.9 cm) Weight: 93.5 kg (206 lb 2.1 oz) IBW/kg (Calculated) : 47.8 Heparin Dosing Weight: 66kg  Vital Signs: Temp: 98.6 F (37 C) (12/26 0000) Temp Source: Oral (12/26 0000) BP: 129/65 (12/26 0400) Pulse Rate: 48 (12/26 0400)  Labs: Recent Labs    08/09/21 0253 08/09/21 0253 08/09/21 0921 08/09/21 1541 08/09/21 2050 08/10/21 0515 08/10/21 1400 08/11/21 0309 08/11/21 0539  HGB 11.2*  --   --   --   --  10.6*  --  11.6*  --   HCT 34.7*  --   --   --   --  32.0*  --  35.0*  --   PLT 275  --   --   --   --  315  --  332  --   APTT  --    < >  --   --  25 74* 92* 105*  --   HEPARINUNFRC  --   --   --   --  >1.10*  --   --  >1.10*  --   CREATININE 0.78  --   --   --   --  0.77  --  0.87  --   TROPONINIHS  --   --    < > 2,835*  --  4,125*  --   --  1,660*   < > = values in this interval not displayed.      Medications:  Infusions:   doxycycline (VIBRAMYCIN) IV Stopped (08/11/21 0504)   heparin 900 Units/hr (08/10/21 1700)    Assessment: 65 yoF, Covid +,  requiring high O2 administration. Complaint of chest pain, Troponin elevated.  PTA Apixaban for Afib transitioned to heparin infusion in the setting of NSTEMI.   - Last dose Apixaban 12/24 at 0900.  Note that Apixaban will artificially elevate Hep levels, will dose based on aPTT until both levels correlate Troponin: 16, 150, 928, 2835, 4125 Heparin level falsely elevated due to recent apixaban.  Today, 08/11/21 -aPTT increased to 105, slightly supratherapeutic on heparin 900 units/hr (aPTT has been steadily increasing) -Heparin level remains elevated given recent apixaban administration -CBC stable -No IV interruptions or complications.  No bleeding reported.    Goal of Therapy:  Heparin level 0.3-0.7 units/ml aPTT 66-102 seconds Monitor platelets by anticoagulation protocol: Yes   Plan:  Reduce heparin drip to 850 units/hour Follow-up repeat aPTT in 6 hours Daily aPTT, heparin level, and CBC Continue to monitor H&H and platelets  Dimple Nanas, PharmD 08/11/2021 7:51  AM

## 2021-08-11 NOTE — Progress Notes (Signed)
ANTICOAGULATION CONSULT NOTE  Pharmacy Consult for Heparin Indication: chest pain/ACS  Allergies  Allergen Reactions   Tetracycline Hives    Patient tolerated Doxycycline Dec 2020   Niacin Other (See Comments)    Mouth blisters   Niaspan [Niacin Er] Other (See Comments)    Mouth blisters   Sulfa Antibiotics Nausea Only and Other (See Comments)    "Tears up stomach"   Sulfonamide Derivatives Other (See Comments)    Reaction: per patient "tears her stomach up"   Codeine Nausea And Vomiting   Erythromycin Nausea And Vomiting   Hydromorphone Hcl Nausea And Vomiting   Morphine And Related Nausea And Vomiting   Nalbuphine Nausea And Vomiting    Nubain   Sulfasalazine Nausea Only and Other (See Comments)    per patient "tears her stomach up", "Tears up stomach"   Tape Rash and Other (See Comments)    No "plastic" tape," please----cloth tape only    Patient Measurements: Height: 5\' 1"  (154.9 cm) Weight: 93.5 kg (206 lb 2.1 oz) IBW/kg (Calculated) : 47.8 Heparin Dosing Weight: 66kg  Vital Signs: Temp: 97.6 F (36.4 C) (12/26 1200) Temp Source: Oral (12/26 1200) Pulse Rate: 84 (12/26 1516)  Labs: Recent Labs    08/09/21 0253 08/09/21 0921 08/09/21 2050 08/10/21 0515 08/10/21 1400 08/11/21 0309 08/11/21 0539 08/11/21 1040 08/11/21 1431  HGB 11.2*  --   --  10.6*  --  11.6*  --   --   --   HCT 34.7*  --   --  32.0*  --  35.0*  --   --   --   PLT 275  --   --  315  --  332  --   --   --   APTT  --    < > 25 74* 92* 105*  --   --  115*  HEPARINUNFRC  --   --  >1.10*  --   --  >1.10*  --   --   --   CREATININE 0.78  --   --  0.77  --  0.87  --   --   --   TROPONINIHS  --    < >  --  4,125*  --   --  1,660* 1,171* 877*   < > = values in this interval not displayed.      Medications:  Infusions:   doxycycline (VIBRAMYCIN) IV Stopped (08/11/21 1533)   heparin 850 Units/hr (08/11/21 2505)    Assessment: 65 yoF, Covid +, requiring high O2 administration. Complaint  of chest pain, Troponin elevated.  PTA Apixaban for Afib transitioned to heparin infusion in the setting of NSTEMI.   - Last dose Apixaban 12/24 at 0900.  Note that Apixaban will artificially elevate Hep levels, will dose based on aPTT until both levels correlate Troponin: 16, 150, 928, 2835, 4125 Heparin level falsely elevated due to recent apixaban.  Today, 08/11/21 aPTT remains supratherapeutic and continues to rise despite decrease in heparin rate to 850 units/hr Heparin level remains elevated given recent apixaban administration CBC stable No bleeding or infusion issues reported; RN confirms phlebotomy done on opposite arm as heparin infusion   Goal of Therapy:  Heparin level 0.3-0.7 units/ml aPTT 66-102 seconds Monitor platelets by anticoagulation protocol: Yes   Plan:  Reduce heparin drip to 700 units/hour Recheck aPTT in 8 hr Daily aPTT, heparin level, and CBC Continue to monitor H&H and platelets  Reuel Boom, PharmD, BCPS [phone] 08/11/2021, 4:47 PM

## 2021-08-11 NOTE — Progress Notes (Signed)
Progress Note  Patient Name: Katherine [name] Date of Encounter: 08/11/2021  Primary Cardiologist: [name], MD  Subjective   Resting this morning, no acute events overnight.  Inpatient Medications    Scheduled Meds:  (feeding supplement) PROSource Plus  30 mL Oral BID BM   arformoterol  15 mcg Nebulization BID   vitamin C  500 mg Oral Daily   aspirin  81 mg Oral Daily   budesonide  2 mL Nebulization BID   calcitRIOL  0.25 mcg Oral Q3 days   Chlorhexidine Gluconate Cloth  6 each Topical Daily   clopidogrel  75 mg Oral Daily   feeding supplement (GLUCERNA SHAKE)  237 mL Oral Q24H   fluticasone  2 spray Each Nare Daily   guaiFENesin-dextromethorphan  10 mL Oral QID   insulin aspart  0-15 Units Subcutaneous TID WC   insulin aspart  0-5 Units Subcutaneous QHS   insulin aspart  4 Units Subcutaneous TID WC   insulin glargine-yfgn  20 Units Subcutaneous Daily   isosorbide mononitrate  60 mg Oral Daily   lamoTRIgine  25 mg Oral QHS   linagliptin  5 mg Oral Daily   loratadine  10 mg Oral Daily   magic mouthwash  5 mL Oral QID   mouth rinse  15 mL Mouth Rinse BID   methylPREDNISolone (SOLU-MEDROL) injection  100 mg Intravenous Q12H   metoprolol tartrate  50 mg Oral BID   montelukast  10 mg Oral QHS   multivitamin with minerals  1 tablet Oral Daily   pantoprazole  40 mg Oral Daily   Ensure Max Protein  11 oz Oral Daily   rosuvastatin  20 mg Oral Daily   sertraline  100 mg Oral Daily   zinc sulfate  220 mg Oral Daily   Continuous Infusions:  doxycycline (VIBRAMYCIN) IV Stopped (08/11/21 0504)   heparin 900 Units/hr (08/10/21 1700)   PRN Meds: acetaminophen **OR** acetaminophen, cyclobenzaprine, ipratropium-albuterol, LORazepam, morphine injection, nitroGLYCERIN, ondansetron (ZOFRAN) IV, oxyCODONE, promethazine, sodium chloride   Vital Signs    Vitals:   08/11/21 0200 08/11/21 0300 08/11/21 0400 08/11/21 0500  BP: (!) 141/68 (!) 116/42 129/65   Pulse: (!) 51 (!)  46 (!) 48   Resp: (!) 21 15 19    Temp:      TempSrc:      SpO2: 99% 100% 100%   Weight:    93.5 kg  Height:        Intake/Output Summary (Last 24 hours) at 08/11/2021 0748 Last data filed at 08/10/2021 1700 Gross per 24 hour  Intake 529.62 ml  Output 2000 ml  Net -1470.38 ml   Filed Weights   08/09/21 0439 08/10/21 0500 08/11/21 0500  Weight: 93.1 kg 93.3 kg 93.5 kg    Telemetry    Sinus rhythm and sinus bradycardia, burst of NSVT.  Personally reviewed.  ECG    An ECG dated 08/11/2021 was personally reviewed today and demonstrated:  Sinus bradycardia with inferior and anterolateral T wave inversions.  Physical Exam   GEN: Chronically ill-appearing.  No acute distress.   Neck: No JVD. Cardiac: RRR, 1/6 murmur, no gallop.  Respiratory: Few crackles anteriorly, no wheezing. GI: Soft, nontender, bowel sounds present. MS: No edema.  Labs    Chemistry Recent Labs  Lab 08/09/21 0253 08/10/21 0515 08/11/21 0309  NA 137 138 137  K 4.1 3.4* 4.3  CL 102 99 98  CO2 27 31 30   GLUCOSE 204* 119* 211*  BUN 34* 38* 41*  CREATININE 0.78 0.77 0.87  CALCIUM 8.7* 8.4* 8.7*  PROT 6.4* 5.9* 6.4*  ALBUMIN 2.6* 2.3* 2.6*  AST 15 27 23   ALT 10 11 13   ALKPHOS 117 99 115  BILITOT 0.6 0.5 0.6  GFRNONAA >60 >60 >60  ANIONGAP 8 8 9      Hematology Recent Labs  Lab 08/09/21 0253 08/10/21 0515 08/11/21 0309  WBC 10.1 9.2 8.4  RBC 3.54* 3.35* 3.64*  HGB 11.2* 10.6* 11.6*  HCT 34.7* 32.0* 35.0*  MCV 98.0 95.5 96.2  MCH 31.6 31.6 31.9  MCHC 32.3 33.1 33.1  RDW 15.3 15.3 15.5  PLT 275 315 332    Cardiac Enzymes Recent Labs  Lab 08/09/21 1117 08/09/21 1251 08/09/21 1541 08/10/21 0515 08/11/21 0539  TROPONINIHS 150* 928* 2,835* 4,125* 1,660*    BNP Recent Labs  Lab 08/09/21 0700 08/10/21 0515 08/11/21 0309  BNP 551.0* 320.3* 202.5*     DDimer Recent Labs  Lab 08/09/21 0253 08/10/21 0515 08/11/21 0309  DDIMER 2.19* 2.01* 1.43*     Radiology    DG  CHEST PORT 1 VIEW  Result Date: 08/11/2021 CLINICAL DATA:  Respiratory failure EXAM: PORTABLE CHEST 1 VIEW COMPARISON:  08/09/2021 FINDINGS: Multifocal patchy opacities with relative sparing of the left upper lobe, reflecting multifocal pneumonia, grossly unchanged. No pleural effusion or pneumothorax. Mild cardiomegaly.  Postsurgical changes related to prior CABG. Median sternotomy. IMPRESSION: Multifocal pneumonia, grossly unchanged. Electronically Signed   By: Julian Hy M.D.   On: 08/11/2021 06:57   DG CHEST PORT 1 VIEW  Result Date: 08/09/2021 CLINICAL DATA:  MRSA pneumonia EXAM: PORTABLE CHEST 1 VIEW COMPARISON:  10/08/2020 FINDINGS: Changes of CABG. Cardiomegaly. Severe diffuse bilateral airspace disease, worsening since prior study. No effusions. No acute bony abnormality. IMPRESSION: Severe diffuse bilateral airspace disease, worsening since prior study. Electronically Signed   By: Rolm Baptise M.D.   On: 08/09/2021 09:21   ECHOCARDIOGRAM COMPLETE  Result Date: 08/09/2021    ECHOCARDIOGRAM REPORT   Patient Name:   Katherine [name] Date of Exam: 08/09/2021 Medical Rec #:  [id number]          Height:       61.0 in Accession #:    [id number]         Weight:       205.2 lb Date of Birth:  01/31/1956          BSA:          1.910 m Patient Age:    65 years           BP:           161/81 mmHg Patient Gender: F                  HR:           58 bpm. Exam Location:  Inpatient Procedure: 2D Echo, Cardiac Doppler, Color Doppler and Intracardiac            Opacification Agent Indications:    Dyspnea  History:        Patient has prior history of Echocardiogram examinations, most                 recent 01/30/2021. Previous Myocardial Infarction and CAD, Prior                 CABG, Signs/Symptoms:Dyspnea; Risk Factors:Hypertension,                 Diabetes and Dyslipidemia. Covid positive.  Sonographer:    Merrie Roof RDCS Referring Phys: [place]  1. Left ventricular  ejection fraction, by estimation, is 45 to 50%. The left ventricle has mildly decreased function. The left ventricle demonstrates regional wall motion abnormalities (see scoring diagram/findings for description). There is mild left ventricular hypertrophy. Left ventricular diastolic function could not be evaluated.  2. Right ventricular systolic function is normal. The right ventricular size is normal.  3. The mitral valve is grossly normal. Trivial mitral valve regurgitation.  4. The aortic valve has an indeterminant number of cusps. There is moderate calcification of the aortic valve. Aortic valve regurgitation is not visualized. Aortic valve sclerosis/calcification is present, without any evidence of aortic stenosis. Comparison(s): Prior images reviewed side by side. Wall motion more similar to last study with Definity contrast on 07/12/2020, although LVEF somewhat better. FINDINGS  Left Ventricle: Left ventricular ejection fraction, by estimation, is 45 to 50%. The left ventricle has mildly decreased function. The left ventricle demonstrates regional wall motion abnormalities. Definity contrast agent was given IV to delineate the left ventricular endocardial borders. The left ventricular internal cavity size was normal in size. There is mild left ventricular hypertrophy. Left ventricular diastolic function could not be evaluated.  LV Wall Scoring: The apex is dyskinetic. The apical septal segment is hypokinetic. The entire anterior wall, entire lateral wall, anterior septum, entire inferior wall, mid inferoseptal segment, and basal inferoseptal segment are normal. Right Ventricle: The right ventricular size is normal. No increase in right ventricular wall thickness. Right ventricular systolic function is normal. Left Atrium: Left atrial size was normal in size. Right Atrium: Right atrial size was normal in size. Pericardium: There is no evidence of pericardial effusion. Mitral Valve: The mitral valve is grossly  normal. Mild mitral annular calcification. Trivial mitral valve regurgitation. Tricuspid Valve: The tricuspid valve is grossly normal. Tricuspid valve regurgitation is mild. Aortic Valve: The aortic valve has an indeterminant number of cusps. There is moderate calcification of the aortic valve. Aortic valve regurgitation is not visualized. Aortic valve sclerosis/calcification is present, without any evidence of aortic stenosis. Pulmonic Valve: The pulmonic valve was grossly normal. Pulmonic valve regurgitation is trivial. Aorta: The aortic root is normal in size and structure. IAS/Shunts: The interatrial septum was not well visualized.  LEFT VENTRICLE PLAX 2D LVIDd:         4.40 cm LVIDs:         3.30 cm LV PW:         1.00 cm LV IVS:        1.10 cm LVOT diam:     2.00 cm LVOT Area:     3.14 cm  LEFT ATRIUM         Index LA diam:    4.80 cm 2.51 cm/m   AORTA Ao Root diam: 3.00 cm  SHUNTS Systemic Diam: 2.00 cm Rozann Lesches MD Electronically signed by Rozann Lesches MD Signature Date/Time: 08/09/2021/3:43:40 PM    Final    VAS [place] LOWER EXTREMITY VENOUS (DVT)  Result Date: 08/10/2021  Lower Venous DVT Study Patient Name:  JERRICA THORMAN  Date of Exam:   08/09/2021 Medical Rec #: [id number]           Accession #:    [id number] Date of Birth: April 09, 1956           Patient Gender: F Patient Age:   33 years Exam Location:  [place] Procedure:      VAS [place] LOWER EXTREMITY  VENOUS (DVT) Referring Phys: A POWELL JR --------------------------------------------------------------------------------  Indications: Covid, D-Dimer.  Limitations: On high flow oxygen. Pain with compression and body habitus. Comparison Study: Prior negative bilateral LEV done 01/07/2011 Performing Technologist: Sharion Dove RVS  Examination Guidelines: A complete evaluation includes B-mode imaging, spectral Doppler, color Doppler, and power Doppler as needed of all accessible portions of each vessel. Bilateral testing is  considered an integral part of a complete examination. Limited examinations for reoccurring indications may be performed as noted. The reflux portion of the exam is performed with the patient in reverse Trendelenburg.  +---------+---------------+---------+-----------+----------+--------------+  RIGHT     Compressibility Phasicity Spontaneity Properties Thrombus Aging  +---------+---------------+---------+-----------+----------+--------------+  CFV       Full            Yes       Yes                                    +---------+---------------+---------+-----------+----------+--------------+  SFJ       Full                                                             +---------+---------------+---------+-----------+----------+--------------+  FV Prox   Full                                                             +---------+---------------+---------+-----------+----------+--------------+  FV Mid    Full                                                             +---------+---------------+---------+-----------+----------+--------------+  FV Distal Full                                                             +---------+---------------+---------+-----------+----------+--------------+  PFV       Full                                                             +---------+---------------+---------+-----------+----------+--------------+  POP       Full            Yes       Yes                                    +---------+---------------+---------+-----------+----------+--------------+  PTV       Full                                                             +---------+---------------+---------+-----------+----------+--------------+  PERO      Full                                                             +---------+---------------+---------+-----------+----------+--------------+   +---------+---------------+---------+-----------+----------+--------------+  LEFT       Compressibility Phasicity Spontaneity Properties Thrombus Aging  +---------+---------------+---------+-----------+----------+--------------+  CFV       Full            Yes       Yes                                    +---------+---------------+---------+-----------+----------+--------------+  SFJ       Full                                                             +---------+---------------+---------+-----------+----------+--------------+  FV Prox   Full                                                             +---------+---------------+---------+-----------+----------+--------------+  FV Mid    Full                                                             +---------+---------------+---------+-----------+----------+--------------+  FV Distal Full                                                             +---------+---------------+---------+-----------+----------+--------------+  PFV       Full                                                             +---------+---------------+---------+-----------+----------+--------------+  POP       Full            Yes       Yes                                    +---------+---------------+---------+-----------+----------+--------------+  PTV       Full                                                             +---------+---------------+---------+-----------+----------+--------------+  PERO      Full                                                             +---------+---------------+---------+-----------+----------+--------------+     Summary: BILATERAL: No ultrasound evidence of deep venous thrombosis  -No evidence of popliteal cyst, bilaterally.   *See table(s) above for measurements and observations. Electronically signed by Jamelle Haring on 08/10/2021 at 3:30:07 PM.    Final     Assessment & Plan    1.  NSTEMI with recent recurrent angina symptoms in the setting of acute illness.  High-sensitivity troponin I up to 4125.  ECG shows inferior and  anterolateral T wave inversions.  2.  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  3.  Component of acute on chronic systolic heart failure.  4.  Paroxysmal atrial fibrillation with CHA2DS2-VASc score of 6.  Transition from Eliquis to heparin in the setting of NSTEMI.  She is in sinus rhythm at this time.  5.  Acute on chronic hypoxic respiratory failure with COPD at baseline, currently with COVID-19 and MRSA pneumonia.  6.  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  At this point continuing supportive measures.  She completed remdesivir and remains on IV steroids.  Also on doxycycline.  Cardiac regimen includes aspirin, Plavix, Lopressor, Imdur, Crestor, and IV heparin.  Received a dose of IV Lasix yesterday with approximately 1100 net output last 24 hours - would repeat Lasix 40 mg IV today.  Continuing to follow.  Can ultimately consider a follow-up diagnostic cardiac catheterization depending on her overall clinical course and prognosis.  Signed, Rozann Lesches, MD  08/11/2021, 7:48 AM

## 2021-08-12 ENCOUNTER — Inpatient Hospital Stay: Payer: Self-pay

## 2021-08-12 ENCOUNTER — Inpatient Hospital Stay (HOSPITAL_COMMUNITY): Payer: Medicare Other

## 2021-08-12 DIAGNOSIS — J9621 Acute and chronic respiratory failure with hypoxia: Secondary | ICD-10-CM | POA: Diagnosis not present

## 2021-08-12 DIAGNOSIS — Z9861 Coronary angioplasty status: Secondary | ICD-10-CM | POA: Diagnosis not present

## 2021-08-12 DIAGNOSIS — I2510 Atherosclerotic heart disease of native coronary artery without angina pectoris: Secondary | ICD-10-CM | POA: Diagnosis not present

## 2021-08-12 LAB — COMPREHENSIVE METABOLIC PANEL
ALT: 14 U/L (ref 0–44)
AST: 18 U/L (ref 15–41)
Albumin: 2.4 g/dL — ABNORMAL LOW (ref 3.5–5.0)
Alkaline Phosphatase: 129 U/L — ABNORMAL HIGH (ref 38–126)
Anion gap: 9 (ref 5–15)
BUN: 44 mg/dL — ABNORMAL HIGH (ref 8–23)
CO2: 28 mmol/L (ref 22–32)
Calcium: 8.8 mg/dL — ABNORMAL LOW (ref 8.9–10.3)
Chloride: 96 mmol/L — ABNORMAL LOW (ref 98–111)
Creatinine, Ser: 0.93 mg/dL (ref 0.44–1.00)
GFR, Estimated: >60 mL/min (ref >60)
Glucose, Bld: 249 mg/dL — ABNORMAL HIGH (ref 70–99)
Potassium: 4.1 mmol/L (ref 3.5–5.1)
Sodium: 133 mmol/L — ABNORMAL LOW (ref 135–145)
Total Bilirubin: 0.8 mg/dL (ref 0.3–1.2)
Total Protein: 6.1 g/dL — ABNORMAL LOW (ref 6.5–8.1)

## 2021-08-12 LAB — CBC WITH DIFFERENTIAL/PLATELET
Abs Immature Granulocytes: 0.07 10*3/uL (ref 0.00–0.07)
Basophils Absolute: 0 10*3/uL (ref 0.0–0.1)
Basophils Relative: 0 %
Eosinophils Absolute: 0 10*3/uL (ref 0.0–0.5)
Eosinophils Relative: 0 %
HCT: 33.1 % — ABNORMAL LOW (ref 36.0–46.0)
Hemoglobin: 11.1 g/dL — ABNORMAL LOW (ref 12.0–15.0)
Immature Granulocytes: 1 %
Lymphocytes Relative: 2 %
Lymphs Abs: 0.2 10*3/uL — ABNORMAL LOW (ref 0.7–4.0)
MCH: 31.4 pg (ref 26.0–34.0)
MCHC: 33.5 g/dL (ref 30.0–36.0)
MCV: 93.5 fL (ref 80.0–100.0)
Monocytes Absolute: 0.4 10*3/uL (ref 0.1–1.0)
Monocytes Relative: 5 %
Neutro Abs: 7.3 10*3/uL (ref 1.7–7.7)
Neutrophils Relative %: 92 %
Platelets: 318 10*3/uL (ref 150–400)
RBC: 3.54 MIL/uL — ABNORMAL LOW (ref 3.87–5.11)
RDW: 14.9 % (ref 11.5–15.5)
WBC: 8 10*3/uL (ref 4.0–10.5)
nRBC: 0 % (ref 0.0–0.2)

## 2021-08-12 LAB — HEMOGLOBIN A1C
Hgb A1c MFr Bld: 7.9 % — ABNORMAL HIGH (ref 4.8–5.6)
Mean Plasma Glucose: 180.03 mg/dL

## 2021-08-12 LAB — GLUCOSE, CAPILLARY
Glucose-Capillary: 282 mg/dL — ABNORMAL HIGH (ref 70–99)
Glucose-Capillary: 307 mg/dL — ABNORMAL HIGH (ref 70–99)
Glucose-Capillary: 305 mg/dL — ABNORMAL HIGH (ref 70–99)
Glucose-Capillary: 229 mg/dL — ABNORMAL HIGH (ref 70–99)

## 2021-08-12 LAB — MAGNESIUM: Magnesium: 1.9 mg/dL (ref 1.7–2.4)

## 2021-08-12 LAB — PHOSPHORUS: Phosphorus: 3.1 mg/dL (ref 2.5–4.6)

## 2021-08-12 LAB — APTT
aPTT: 73 seconds — ABNORMAL HIGH (ref 24–36)
aPTT: 70 seconds — ABNORMAL HIGH (ref 24–36)

## 2021-08-12 LAB — HEPARIN LEVEL (UNFRACTIONATED): Heparin Unfractionated: 0.77 IU/mL — ABNORMAL HIGH (ref 0.30–0.70)

## 2021-08-12 LAB — BRAIN NATRIURETIC PEPTIDE: B Natriuretic Peptide: 194.7 pg/mL — ABNORMAL HIGH (ref 0.0–100.0)

## 2021-08-12 LAB — TROPONIN I (HIGH SENSITIVITY): Troponin I (High Sensitivity): 926 ng/L (ref <18)

## 2021-08-12 MED ORDER — INSULIN ASPART 100 UNIT/ML IJ SOLN
8.0000 [IU] | Freq: Three times a day (TID) | INTRAMUSCULAR | Status: DC
  Administered 2021-08-12 – 2021-08-14 (×7): 8 [IU] via SUBCUTANEOUS

## 2021-08-12 MED ORDER — SODIUM CHLORIDE 0.9% FLUSH
10.0000 mL | Freq: Two times a day (BID) | INTRAVENOUS | Status: DC
  Administered 2021-08-12 – 2021-08-13 (×3): 10 mL
  Administered 2021-08-13 – 2021-08-14 (×2): 20 mL
  Administered 2021-08-14: 21:00:00 30 mL
  Administered 2021-08-15: 22:00:00 20 mL
  Administered 2021-08-15 – 2021-08-17 (×5): 10 mL
  Administered 2021-08-18: 23:00:00 20 mL
  Administered 2021-08-18 – 2021-08-19 (×2): 10 mL
  Administered 2021-08-19: 22:00:00 20 mL
  Administered 2021-08-20 – 2021-08-25 (×11): 10 mL

## 2021-08-12 MED ORDER — PREDNISONE 10 MG PO TABS
10.0000 mg | ORAL_TABLET | Freq: Every day | ORAL | Status: AC
  Administered 2021-08-19 – 2021-08-20 (×2): 10 mg via ORAL
  Filled 2021-08-12 (×2): qty 1

## 2021-08-12 MED ORDER — INSULIN GLARGINE-YFGN 100 UNIT/ML ~~LOC~~ SOLN
25.0000 [IU] | Freq: Every day | SUBCUTANEOUS | Status: DC
Start: 2021-08-13 — End: 2021-08-15
  Administered 2021-08-13 – 2021-08-14 (×2): 25 [IU] via SUBCUTANEOUS
  Filled 2021-08-12 (×3): qty 0.25

## 2021-08-12 MED ORDER — FUROSEMIDE 10 MG/ML IJ SOLN
40.0000 mg | Freq: Four times a day (QID) | INTRAMUSCULAR | Status: AC
  Administered 2021-08-12 (×2): 40 mg via INTRAVENOUS
  Filled 2021-08-12 (×2): qty 4

## 2021-08-12 MED ORDER — PREDNISONE 10 MG PO TABS: 10.0000 mg | ORAL_TABLET | Freq: Every day | ORAL | Status: DC

## 2021-08-12 MED ORDER — FUROSEMIDE 10 MG/ML IJ SOLN
40.0000 mg | Freq: Two times a day (BID) | INTRAMUSCULAR | Status: DC
  Administered 2021-08-13 – 2021-08-19 (×13): 40 mg via INTRAVENOUS
  Filled 2021-08-12 (×13): qty 4

## 2021-08-12 MED ORDER — GLUCERNA SHAKE PO LIQD
237.0000 mL | Freq: Three times a day (TID) | ORAL | Status: DC
  Administered 2021-08-12 – 2021-08-25 (×32): 237 mL via ORAL
  Filled 2021-08-12 (×39): qty 237

## 2021-08-12 MED ORDER — PREDNISONE 20 MG PO TABS
40.0000 mg | ORAL_TABLET | Freq: Every day | ORAL | Status: AC
  Administered 2021-08-15 – 2021-08-16 (×2): 40 mg via ORAL
  Filled 2021-08-12 (×2): qty 2

## 2021-08-12 MED ORDER — SODIUM CHLORIDE 0.9% FLUSH
10.0000 mL | INTRAVENOUS | Status: DC | PRN
  Administered 2021-08-18: 20 mL

## 2021-08-12 MED ORDER — METHYLPREDNISOLONE SODIUM SUCC 125 MG IJ SOLR
50.0000 mg | Freq: Two times a day (BID) | INTRAMUSCULAR | Status: AC
  Administered 2021-08-12 – 2021-08-14 (×4): 50 mg via INTRAVENOUS
  Filled 2021-08-12 (×4): qty 2

## 2021-08-12 MED ORDER — PREDNISONE 20 MG PO TABS
30.0000 mg | ORAL_TABLET | Freq: Every day | ORAL | Status: AC
  Administered 2021-08-17 – 2021-08-18 (×2): 30 mg via ORAL
  Filled 2021-08-12 (×2): qty 1

## 2021-08-12 NOTE — TOC Initial Note (Signed)
Transition of Care [facility]) - Initial/Assessment Note    Patient Details  Name: Katherine [name] MRN: [id number] Date of Birth: 1956-06-04  Transition of Care [facility]) CM/SW Contact:    Leeroy Cha, RN Phone Number: 08/12/2021, 8:02 AM  Clinical Narrative:                  Transition of Care [facility]) Screening Note   Patient Details  Name: Katherine [name] Date of Birth: [birth year]   Transition of Care [facility]) CM/SW Contact:    Leeroy Cha, RN Phone Number: 08/12/2021, 8:03 AM    Transition of Care Department (TOC) has reviewed patient and no TOC needs have been identified at this time. We will continue to monitor patient advancement through interdisciplinary progression rounds. If new patient transition needs arise, please place a TOC consult.    Expected Discharge Plan: Home/Self Care Barriers to Discharge: Continued Medical Work up   Patient Goals and CMS Choice Patient states their goals for this hospitalization and ongoing recovery are:: on high flow o2 unable to state CMS Medicare.gov Compare Post Acute Care list provided to:: Patient    Expected Discharge Plan and Services Expected Discharge Plan: Home/Self Care   Discharge Planning Services: CM Consult   Living arrangements for the past 2 months: Single Family Home                                      Prior Living Arrangements/Services Living arrangements for the past 2 months: Single Family Home Lives with:: Self Patient language and need for interpreter reviewed:: Yes              Criminal Activity/Legal Involvement Pertinent to Current Situation/Hospitalization: No - Comment as needed  Activities of Daily Living Home Assistive Devices/Equipment: Oxygen ADL Screening (condition at time of admission) Patient's cognitive ability adequate to safely complete daily activities?: Yes Is the patient deaf or have difficulty hearing?: No Does the patient have difficulty seeing, even when  wearing glasses/contacts?: No Does the patient have difficulty concentrating, remembering, or making decisions?: No Patient able to express need for assistance with ADLs?: Yes Does the patient have difficulty dressing or bathing?: No Independently performs ADLs?: Yes (appropriate for developmental age) Does the patient have difficulty walking or climbing stairs?: Yes Weakness of Legs: Both Weakness of Arms/Hands: Both  Permission Sought/Granted                  Emotional Assessment Appearance:: Appears older than stated age Attitude/Demeanor/Rapport: Unable to Assess Affect (typically observed): Unable to Assess Orientation: : Oriented to Self, Oriented to Place, Oriented to  Time, Oriented to Situation Alcohol / Substance Use: Not Applicable Psych Involvement: No (comment)  Admission diagnosis:  Acute on chronic respiratory failure with hypoxia (HCC) [J96.21] COVID-19 [U07.1] Patient Active Problem List   Diagnosis Date Noted   MRSA pneumonia ([place]) 08/09/2021   Positive D dimer 08/09/2021   Chronic diastolic CHF (congestive heart failure) ([place]) 08/06/2021   COVID-19 virus infection 08/05/2021   Obesity (BMI 30-39.9) 08/05/2021   Acute on chronic respiratory failure with hypoxia ([place]) 08/04/2021   Pressure injury of skin 08/04/2021   Polymorphic ventricular tachycardia    Bradycardia    Acute encephalopathy    COPD (chronic obstructive pulmonary disease) (HCC)    AKI (acute kidney injury) ([place])    History of renal transplant    Uncontrolled type 2 diabetes mellitus  with hyperglycemia, 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    Nausea & vomiting 04/19/2021   Acute kidney injury ([place]) 04/19/2021   Viral syndrome 03/21/2021   Pure hypercholesterolemia    Coronary stent restenosis due to scar tissue    Unstable angina (HCC) 02/19/2021   Acute CHF (congestive heart failure) ([place]) 01/29/2021   Non-ST elevation (NSTEMI) myocardial infarction [facility])     Chronic respiratory failure with hypoxia ([place]) 01/01/2021   Flu vaccine need 08/13/2020   Need for pneumococcal vaccination 08/13/2020   Visit for screening mammogram 08/13/2020   Candidal skin infection 08/13/2020   GAD (generalized anxiety disorder) 08/13/2020   Chronic bronchitis, mucopurulent ([place]) 08/24/2019   Current chronic use of systemic steroids 07/23/2019   OSA (obstructive sleep apnea)    Severe episode of recurrent major depressive disorder, with psychotic features ([place]) 02/16/2019   Obstructive chronic bronchitis without exacerbation ([place]) 11/03/2018   Long term (current) use of antithrombotics/antiplatelets 04/28/2018   Squamous cell carcinoma of skin of right thumb 02/02/2018   Renal transplant, status post 01/28/2018   LBBB (left bundle branch block) 10/07/2017   Primary osteoarthritis of right knee 08/18/2017   Shortness of breath 06/18/2017   Ovarian cyst 05/18/2017   Pelvic mass in female 03/11/2017   Mild aortic stenosis by prior echocardiogram 01/28/2017   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   Deficiency anemia 10/14/2016   Normocytic anemia 10/13/2016   Fibromyalgia 03/30/2016   Other spondylosis with radiculopathy, lumbar region 03/30/2016   Type 2 diabetes mellitus with peripheral neuropathy ([place]) 03/30/2016   Paroxysmal atrial fibrillation ([place]); CHA2DS2VASc score F, HTN, CAD, CVA = 5 06/28/2014   Fluttering heart 06/21/2014   CAD S/P percutaneous coronary angioplasty - PCI x 5 to SVG-D1 09/11/2013    Class: Diagnosis of   Essential hypertension    NSTEMI (non-ST elevated myocardial infarction) ([place]) 08/23/2013   PAD (peripheral artery disease) ([place]) 08/17/2013   CAD (coronary artery disease) of bypass graft 10/08/2012   Stenosis of right carotid artery without infarction 10/08/2012   Dyslipidemia, goal LDL below 70 10/08/2012    Class: Diagnosis of   Mitral annular calcification 10/08/2012   Hypomagnesemia 06/13/2012   Renal transplant disorder 06/12/2012    Chronic allergic rhinitis 04/29/2011   Extrinsic asthma 09/09/2007   GERD 09/09/2007   COUGH, CHRONIC 09/09/2007   Morbid obesity - s/p Lap Band 9/'05 05/07/2004    Class: Diagnosis of   Hx of CABG 09/07/1993    Class: History of   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 07/1993   Coronary artery disease involving native coronary artery of native heart with angina pectoris ([place]) 07/1993   ESRD (end stage renal disease) ([place]) 1991   PCP:  [name], MD Pharmacy:   [id number] - [place], [address] [place] [address] Phone: [phone] Fax: [phone]     Social Determinants of Health (SDOH) Interventions    Readmission Risk Interventions Readmission Risk Prevention Plan 05/14/2021 02/21/2021 01/31/2021  Transportation Screening Complete Complete Complete  HRI or Home Care Consult - - Complete  Social Work Consult for Recovery Care Planning/Counseling - - Complete  Palliative Care Screening - - Not Applicable  Medication Review Press photographer) Complete Complete Complete  PCP or Specialist appointment within 3-5 days of discharge Complete Complete -  HRI or Home Care Consult Complete Complete -  SW Recovery Care/Counseling Consult Complete Complete -  Palliative Care Screening Not Applicable Not Applicable -  [place] Not Applicable Not Applicable -  Some recent data might be hidden

## 2021-08-12 NOTE — Consult Note (Signed)
[facility] Inpatient Consult   08/12/2021  Katherine [name] [birth year] [id number]  [place] [facility] CM)   Patient chart reviewed due to extreme high risk score for unplanned readmission.    Patient was reviewed for less than 30 days readmission. Patient is active in an with chronic care and chronic disease management team in primary care office.    Plan: Will continue to follow for progression and disposition and update community care Freight forwarder.   Of note, [facility] Care Management services does not replace or interfere with any services that are arranged by inpatient case management or social work.   Netta Cedars, MSN, RN [facility] [employer] [phone]  Toll free office [phone]

## 2021-08-12 NOTE — Progress Notes (Signed)
Nutrition Follow-up  DOCUMENTATION CODES:  Obesity unspecified  INTERVENTION:  Increase Glucerna shakes from daily to TID.  Continue Ensure Max daily.  Continue ProSource Plus BID.  Continue MVI with minerals daily.  Encourage PO and supplement intake.  Consider starting bowel regimen given no bowel movement since PTA.  NUTRITION DIAGNOSIS:  Increased nutrient needs related to acute illness, catabolic illness (QZESP-23 infection) as evidenced by estimated needs. - ongoing  GOAL:  Patient will meet greater than or equal to 90% of their needs. - progressing  MONITOR:  PO intake, Supplement acceptance, Labs, Weight trends  REASON FOR ASSESSMENT:  Malnutrition Screening Tool    ASSESSMENT:  65 y.o. 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 HTN, OSA, PAD, and afib. She presented to the ED with mild confusion, progressively worsening weakness, worsening respiratory failure with hypoxia (84% sats requiring 6L O2) low-grade fever, chills, fatigue, malaise, headache, sore throat, rhinorrhea, dry cough, and several episodes of loose stools for several days.  Unable to see patient. Pt having Peripherally Inserted Central Catheter Placement at time of RD visit.  Per Epic, pt has eaten an average of 41% of the last 8 meals documented (0-100%).  Of note, pt has not had a bowel movement in 8 days and is on no bowel regimen. RD to message MD for recommendation on starting bowel regimen.   Admit wt: 88.9 kg Current wt: 88.1 kg  Of note, pt with non-pitting BLE and BUE edema.  Plan to increase Glucerna to TID to aid in caloric and protein intake.  Supplements: ProSource Plus  BID, Glucerna shake daily, Ensure Max daily  Medications: reviewed; Vitamin C, calcitriol every 3 days, Lasix QID, SSI, Semglee, Solu-Medrol, MVI with minerals, Protonix, zinc sulfate, Ativan PRN (given once today), oxycodone PO PRN (given once today)  Labs: reviewed; Na 133 (L), CBG 229-404 (H) HbA1c: 7.9% (08/11/2021)  NUTRITION - FOCUSED PHYSICAL EXAM: Unable to perform - defer to in-person follow-up  Diet Order:   Diet Order             Diet heart healthy/carb modified Room service appropriate? Yes; Fluid consistency: Thin  Diet effective now                  EDUCATION NEEDS:  Not appropriate for education at this time  Skin:  Skin Assessment: Skin Integrity Issues: Skin Integrity Issues:: Stage II Stage II: rectum  Last BM:  PTA  Height:  Ht Readings from Last 1 Encounters:  08/05/21 5\' 1"  (1.549 m)   Weight:  Wt Readings from Last 1 Encounters:  08/12/21 88.1 kg   BMI:  Body mass index is 36.7 kg/m.  Estimated Nutritional Needs:  Kcal:  1800-2000 kcal Protein:  90-105 grams Fluid:  >/= 2 L/day  Derrel Nip, RD, LDN (she/her/hers) Clinical Inpatient Dietitian RD Pager/After-Hours/Weekend Pager # in [place]

## 2021-08-12 NOTE — Progress Notes (Signed)
Peripherally Inserted Central Catheter Placement  The IV Nurse has discussed with the patient and/or persons authorized to consent for the patient, the purpose of this procedure and the potential benefits and risks involved with this procedure.  The benefits include less needle sticks, lab draws from the catheter, and the patient may be discharged home with the catheter. Risks include, but not limited to, infection, bleeding, blood clot (thrombus formation), and puncture of an artery; nerve damage and irregular heartbeat and possibility to perform a PICC exchange if needed/ordered by physician.  Alternatives to this procedure were also discussed.  Bard Power PICC patient education guide, fact sheet on infection prevention and patient information card has been provided to patient /or left at bedside.    PICC Placement Documentation  PICC Double Lumen 68/34/19 PICC Left Basilic 38 cm 0 cm (Active)       Jule Economy Horton 08/12/2021, 1:36 PM

## 2021-08-12 NOTE — Progress Notes (Signed)
PROGRESS NOTE    Katherine [name]  ERX:[id number] DOB: 1955/10/29 DOA: 08/04/2021 PCP: [name], MD  Chief Complaint  Patient presents with   generalized weakness    Brief Narrative:  This is Katherine [name] 65 year old female with anxiety, depression, CAD/CABG, chronic hypoxic respiratory failure related to COPD on 3 L of oxygen at home, type 2 diabetes mellitus, end-stage renal disease status post renal transplant, GERD, obesity, hypertension paroxysmal atrial fibrillation, PAD who presented to the hospital on 12/19 with shortness of breath and generalized weakness.  She was found to have Katherine [name] pulse ox of 84% on her usual oxygen, noted to be wheezing and had Katherine [name] cough with yellow sputum.  COVID PCR noted to be positive.  She's been admitted for COVID 19 pneumonia and now found to have MRSA pneumonia.  She's had progressively worsening O2 needs, now on heated high flow.    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  Possible contribution from HF. Uses 2 LPM at home COVID test + on 12/18 CXR 12/24 with worsening severe diffuse bilateral airspace disease CT PE protocol chest 08/05/2021: limited for PE, but negative, extensive heterogenous and GGO throughout lungs  Satting in 90's, on 25 L HHFNC (slowly improving) Elevated BNP -> lasix as tolerated Completed remdesivir.  Continue IV steroids for covid and abx to cover for MRSA pneumonia She's immunosuppressed on antirejection meds with renal transplant (not candidate for baricitinib or actemra with MRSA infection and immunosuppression).  Poor prognosis with immunosuppression and covid.  COVID-19 Labs  Recent Labs    08/10/21 0515 08/11/21 0309  DDIMER 2.01* 1.43*  CRP  --  2.4*    COVID-19 Labs  Recent Labs    08/10/21 0515 08/11/21 0309  DDIMER 2.01* 1.43*  CRP  --  2.4*    Lab Results  Component Value Date   SARSCOV2NAA POSITIVE (Katherine [name]) 08/04/2021   [place] NEGATIVE 05/12/2021   [place] NEGATIVE 04/19/2021   [place] NEGATIVE 03/30/2021     MRSA pneumonia ([place]) 12/20 sputum cx with MRSA (and candida albicans - suspect this is contaminant/colonizer)  On doxycycline, plan for 7 days (12/23 - present) As above  COVID-19 virus infection- (present on admission) As above Steroids, remdesivir (s/p 5 doses) Tapering steroids  Acute CHF (congestive heart failure) (HCC) Elevated BNP Follow echo - EF 45-50%, RWMA (see report) Lasix as tolerated, follow renal function Strict I/O, daily weights - net negative  NSTEMI (non-ST elevated myocardial  infarction) ([place]) Episode of CP similar to prior heart attacks on 12/24 Continue nitro prn  Was already anticoagulated on eliquis, antiplatelet on plavix -> given concerning symptoms/rising troponin -> transitioned to heparin gtt, aspirin added to placix EF 45-50%, RWMA's (see report) Cardiology c/s, appreciate assistance -> plan for aspirin, plavix,  imdur (increased dose), lopressor, crestor - consider arb/arni if BP remains high, consider cath pending clinical status (note 12/25) Would want her O2 needs to improve prior to diagnostic cath - still has significant O2 needs Ok to work with PT/OT if asymptomatic  Positive D dimer On anticoagulation Follow LE [place] - negative  Uncontrolled type 2 diabetes mellitus with hyperglycemia, with long-term current use of insulin ([place]) with neuropathy Adjust regimen as needed - poorly controlled with DM Basal insulin, bolus, SSI On tradjenta Holding Metformin and Amaryl  History of renal transplant 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 Receives prednisone and Imuran. Placing the patient at high risk for poor outcome secondary to chronic immunosuppressive status  Paroxysmal atrial fibrillation (HCC); CHA2DS2VASc score F, HTN, CAD, CVA = 5- (present on admission) Continue Metoprolol (dose reduced due to brady) and Eliquis -> now on heparin gtt  Essential hypertension Recently improved, follow Continue home BP meds, consider adding meds pending trend  CAD S/P percutaneous coronary angioplasty - PCI x 5 to SVG-D1 Prior history.  Monitor. W/u for chest pain above  PAD (peripheral artery disease) ([place])- (present on admission) Prior history.  Monitor.  Chronic diastolic CHF (congestive heart failure) (HCC)- (present on admission) Grade 1 diastolic dysfunction  Pressure injury of skin- (present on admission) Stage II, medial rectal ulcer. Present on admission. Continue dressing changes  OSA (obstructive sleep apnea)- (present on admission) Does not use CPAP at night after lap band.  Hypomagnesemia- (present on admission) Replaced.  Obesity (BMI 30-39.9)- (present on admission) Body mass index is 37.03 kg/m.  Placing the patient at high risk of poor outcome.  Monitor.  GAD (generalized anxiety disorder)- (present on admission) Continue Zoloft and Ativan.  Dyslipidemia, goal LDL below  70- (present on admission) Continue statin.   DVT prophylaxis: heparin gtt (previously eliquis) Code Status: full Family Communication: none at bedside - discussed with niece 12/24, no answer 12/25 Disposition:   Status is: Inpatient  Remains inpatient appropriate because: sig O2 requirement, NSTEMI       Consultants:  cardiology  Procedures:  echo IMPRESSIONS     1. Left ventricular ejection fraction, by estimation, is 45 to 50%. The  left ventricle has mildly decreased function. The left ventricle  demonstrates regional wall motion abnormalities (see scoring  diagram/findings for description). There is mild left  ventricular hypertrophy. Left ventricular diastolic function could not be  evaluated.   2. Right ventricular systolic function is normal. The right ventricular  size is normal.   3. The mitral valve is grossly normal. Trivial mitral valve  regurgitation.   4. The aortic valve has an indeterminant number of cusps. There is  moderate calcification of the aortic valve. Aortic valve regurgitation is  not visualized. Aortic valve sclerosis/calcification is present, without  any evidence of aortic stenosis.   Comparison(s): Prior images reviewed side by side. Wall motion more  similar to last study with Definity contrast on 07/12/2020, although LVEF  somewhat better.   Summary:  BILATERAL:  -No evidence of popliteal cyst, bilaterally.      Antimicrobials:  Anti-infectives (From admission, onward)    Start     Dose/Rate Route Frequency Ordered Stop   08/08/21 1400  doxycycline (VIBRAMYCIN) 100 mg in sodium chloride 0.9 % 250 mL IVPB        100 mg 125 mL/hr over 120 Minutes Intravenous Every 12 hours 08/08/21 1327 08/15/21 1414   08/06/21 1000  remdesivir 100 mg in sodium chloride 0.9 % 100 mL IVPB  Status:  Discontinued       See Hyperspace for full Linked Orders Report.   100 mg 200 mL/hr over 30 Minutes Intravenous Daily 08/05/21 0720 08/05/21 0720    08/06/21 1000  remdesivir 100 mg in sodium chloride 0.9 % 100 mL IVPB        100 mg 200 mL/hr over 30 Minutes Intravenous Daily 08/05/21 0721 08/09/21 0937   08/05/21 1000  remdesivir 100 mg in sodium chloride 0.9 % 100 mL IVPB  Status:  Discontinued       See Hyperspace for full Linked Orders Report.   100 mg 200 mL/hr over 30 Minutes Intravenous Daily 08/04/21 0819 08/05/21 0720   08/05/21 1000  remdesivir 200 mg in sodium chloride 0.9% 250 mL IVPB        200 mg 580 mL/hr over 30 Minutes Intravenous Once 08/05/21 0719 08/05/21 1150   08/05/21 1000  cefTRIAXone (ROCEPHIN) 2 g in sodium chloride 0.9 % 100 mL IVPB        2 g 200 mL/hr over 30 Minutes Intravenous Every 24 hours 08/05/21 0815 08/09/21 1032   08/05/21 1000  azithromycin (ZITHROMAX) 500 mg in sodium chloride 0.9 % 250 mL IVPB  Status:  Discontinued        500 mg 250 mL/hr over 60 Minutes Intravenous Every 24 hours 08/05/21 0815 08/05/21 0907   08/04/21 0930  remdesivir 200 mg in sodium chloride 0.9% 250 mL IVPB  Status:  Discontinued       See Hyperspace for full Linked Orders Report.   200 mg 580 mL/hr over 30 Minutes Intravenous Once 08/04/21 0819 08/05/21 0720       Subjective: No new complaints, poor appetite   Objective: Vitals:   08/12/21 1100 08/12/21 1200 08/12/21 1400 08/12/21 1500  BP: (!) 135/51 (!) 132/57 129/85 110/74  Pulse: 72 71 80 81  Resp: 19 (!) 21 (!) 24 (!) 24  Temp:      TempSrc:      SpO2: 92% 94% (!) 88% 92%  Weight:      Height:        Intake/Output Summary (Last 24 hours) at 08/12/2021 1709 Last data filed at 08/12/2021 1410 Gross per 24 hour  Intake 414.79 ml  Output 1300 ml  Net -885.21 ml   Filed Weights   08/10/21 0500 08/11/21 0500 08/12/21 0700  Weight: 93.3 kg 93.5 kg 88.1 kg    Examination:  General: No acute distress. Cardiovascular: Heart sounds show Jorden Minchey regular rate, and rhythm.  Lungs: distant, isolation scope, no clear adventitious lung sounds Abdomen: Soft,  nontender, nondistended Neurological: Alert and oriented 3. Moves all extremities 4. Cranial nerves II through XII grossly intact. Skin: Warm and dry. No rashes or lesions. Extremities: No clubbing or cyanosis. No edema.  Data Reviewed: I have personally reviewed following labs and imaging studies  CBC: Recent Labs  Lab 08/08/21 0256 08/09/21 0253 08/10/21 0515 08/11/21 0309 08/12/21 0312  WBC 11.1* 10.1 9.2 8.4 8.0  NEUTROABS 10.4* 9.3* 8.4* 7.9* 7.3  HGB 10.7* 11.2* 10.6* 11.6* 11.1*  HCT 32.1* 34.7* 32.0* 35.0* 33.1*  MCV 95.8 98.0 95.5 96.2 93.5  PLT 282 275 315 332 318  Basic Metabolic Panel: Recent Labs  Lab 08/08/21 0256 08/09/21 0253 08/10/21 0515 08/11/21 0309 08/12/21 0312  NA 137 137 138 137 133*  K 3.9 4.1 3.4* 4.3 4.1  CL 100 102 99 98 96*  CO2 27 27 31 30 28   GLUCOSE 255* 204* 119* 211* 249*  BUN 26* 34* 38* 41* 44*  CREATININE 0.74 0.78 0.77 0.87 0.93  CALCIUM 9.0 8.7* 8.4* 8.7* 8.8*  MG 2.1 2.2 2.1 2.2 1.9  PHOS  --   --  3.7 3.1 3.1    GFR: Estimated Creatinine Clearance: 61.6 mL/min (by C-G formula based on SCr of 0.93 mg/dL).  Liver Function Tests: Recent Labs  Lab 08/08/21 0256 08/09/21 0253 08/10/21 0515 08/11/21 0309 08/12/21 0312  AST 17 15 27 23 18   ALT 9 10 11 13 14   ALKPHOS 112 117 99 115 129*  BILITOT 0.5 0.6 0.5 0.6 0.8  PROT 6.6 6.4* 5.9* 6.4* 6.1*  ALBUMIN 2.7* 2.6* 2.3* 2.6* 2.4*    CBG: Recent Labs  Lab 08/11/21 1321 08/11/21 1743 08/11/21 2127 08/12/21 0736 08/12/21 1325  GLUCAP 288* 404* 229* 282* 307*     Recent Results (from the past 240 hour(s))  Resp Panel by RT-PCR (Flu Irisha Grandmaison&B, Covid) Nasopharyngeal Swab     Status: Abnormal   Collection Time: 08/04/21  3:22 AM   Specimen: Nasopharyngeal Swab; Nasopharyngeal(NP) swabs in vial transport medium  Result Value Ref Range Status   SARS Coronavirus 2 by RT PCR POSITIVE ([place]) NEGATIVE Final    Comment: (NOTE) SARS-CoV-2 target nucleic acids are  DETECTED.  The SARS-CoV-2 RNA is generally detectable in upper respiratory specimens during the acute phase of infection. Positive results are indicative of the presence of the identified virus, but do not rule out bacterial infection or co-infection with other pathogens not detected by the test. Clinical correlation with patient history and other diagnostic information is necessary to determine patient infection status. The expected result is Negative.  Fact Sheet for Patients: EntrepreneurPulse.com.au  Fact Sheet for Healthcare Providers: IncredibleEmployment.be  This test is not yet approved or cleared by the [place] FDA and  has been authorized for detection and/or diagnosis of SARS-CoV-2 by FDA under an Emergency Use Authorization (EUA).  This EUA will remain in effect (meaning this test can be used) for the duration of  the COVID-19 declaration under Section 564(b)(1) of the Marni Franzoni ct, 21 U.S.C. section 360bbb-3(b)(1), unless the authorization is terminated or revoked sooner.     Influenza Laquilla Dault by PCR NEGATIVE NEGATIVE Final   Influenza B by PCR NEGATIVE NEGATIVE Final    Comment: (NOTE) The Xpert Xpress SARS-CoV-2/FLU/RSV plus assay is intended as an aid in the diagnosis of influenza from Nasopharyngeal swab specimens and should not be used as Kayleb Warshaw sole basis for treatment. Nasal washings and aspirates are unacceptable for Xpert Xpress SARS-CoV-2/FLU/RSV testing.  Fact Sheet for Patients: EntrepreneurPulse.com.au  Fact Sheet for Healthcare Providers: IncredibleEmployment.be  This test is not yet approved or cleared by the [place] FDA and has been authorized for detection and/or diagnosis of SARS-CoV-2 by FDA under an Emergency Use Authorization (EUA). This EUA will remain in effect (meaning this test can be used) for the duration of the COVID-19 declaration under Section 564(b)(1) of the Act, 21  U.S.C. section 360bbb-3(b)(1), unless the authorization is terminated or revoked.  Performed at [facility], [place] [address], [place],  [address]   MRSA Next Gen by PCR, Nasal     Status: None  Collection Time: 08/04/21  4:36 PM   Specimen: Nasal Mucosa; Nasal Swab  Result Value Ref Range Status   MRSA by PCR Next Gen NOT DETECTED NOT DETECTED Final    Comment: (NOTE) The GeneXpert MRSA Assay (FDA approved for NASAL specimens only), is one component of Nalini Alcaraz comprehensive MRSA colonization surveillance program. It is not intended to diagnose MRSA infection nor to guide or monitor treatment for MRSA infections. Test performance is not FDA approved in patients less than 64 years old. Performed at [facility], [place] [address] [place], [place] [address]   Culture, blood (routine x 2) Call MD if unable to obtain prior to antibiotics being given     Status: None   Collection Time: 08/05/21 10:12 AM   Specimen: BLOOD LEFT HAND  Result Value Ref Range Status   Specimen Description   Final    BLOOD LEFT HAND Performed at [place] [address] [place], [place] [address]    Special Requests   Final    BOTTLES DRAWN AEROBIC ONLY Blood Culture results may not be optimal due to an inadequate volume of blood received in culture bottles Performed at [place] [address], [place], [place] [address]    Culture   Final    NO GROWTH 5 DAYS Performed at [facility], [place] [address], [place], [place] [address]    Report Status 08/10/2021 FINAL  Final  Expectorated Sputum Assessment w Gram Stain, Rflx to Resp Cult     Status: None   Collection Time: 08/05/21 10:31 AM   Specimen: Expectorated Sputum  Result Value Ref Range Status   Specimen Description EXPECTORATED SPUTUM  Final   Special Requests NONE  Final   Sputum evaluation   Final    Sputum specimen not acceptable for testing.  Please  recollect.   RESULT CALLED TO, READ BACK BY AND VERIFIED WITH: NOTIFY Joesphine Bare RN ON 07/2021 @ 1104 BY MECIAL J. Performed at [facility], [place] [address], [place], [place] [address]    Report Status 08/05/2021 FINAL  Final  Culture, blood (routine x 2) Call MD if unable to obtain prior to antibiotics being given     Status: None   Collection Time: 08/05/21 11:07 AM   Specimen: BLOOD RIGHT HAND  Result Value Ref Range Status   Specimen Description   Final    BLOOD RIGHT HAND Performed at [place] [address], [place], [place] [address]    Special Requests   Final    BOTTLES DRAWN AEROBIC ONLY Blood Culture results may not be optimal due to an inadequate volume of blood received in culture bottles Performed at [place] [address] [place], [place] [address]    Culture   Final    NO GROWTH 5 DAYS Performed at [facility], [place] [address], [place], [place] [address]    Report Status 08/10/2021 FINAL  Final  Expectorated Sputum Assessment w Gram Stain, Rflx to Resp Cult     Status: None   Collection Time: 08/05/21  1:29 PM   Specimen: SPU  Result Value Ref Range Status   Specimen Description SPUTUM  Final   Special Requests NONE  Final   Sputum evaluation   Final    THIS SPECIMEN IS ACCEPTABLE FOR SPUTUM CULTURE Performed at [facility], [place] [address], [place], [place] [address]    Report Status 08/05/2021 FINAL  Final  Culture, Respiratory w Gram Stain     Status: None   Collection Time: 08/05/21  1:29 PM   Specimen: SPU  Result Value Ref Range Status   Specimen Description   Final    SPUTUM Performed at [place] [address] [place], [place] [address]    Special Requests   Final    NONE Reflexed from [phone] Performed at [facility], [place] [address], [place], [place] [address]    Gram Stain   Final    ABUNDANT WBC PRESENT,  PREDOMINANTLY MONONUCLEAR MODERATE YEAST FEW GRAM POSITIVE RODS FEW GRAM POSITIVE COCCI Performed at [facility], [place] [address] [place], [place] [address]    Culture   Final    MODERATE METHICILLIN RESISTANT STAPHYLOCOCCUS AUREUS FEW CANDIDA ALBICANS    Report Status 08/08/2021 FINAL  Final   Organism ID, Bacteria METHICILLIN RESISTANT STAPHYLOCOCCUS AUREUS  Final      Susceptibility   Methicillin resistant staphylococcus aureus - MIC*    CIPROFLOXACIN >=8 RESISTANT Resistant     ERYTHROMYCIN >=8 RESISTANT Resistant     GENTAMICIN <=0.5 SENSITIVE Sensitive     OXACILLIN >=4 RESISTANT Resistant     TETRACYCLINE <=1 SENSITIVE Sensitive     VANCOMYCIN <=0.5 SENSITIVE Sensitive     TRIMETH/SULFA <=10 SENSITIVE Sensitive     CLINDAMYCIN >=8 RESISTANT Resistant     RIFAMPIN <=0.5 SENSITIVE Sensitive     Inducible Clindamycin NEGATIVE Sensitive     * MODERATE METHICILLIN RESISTANT STAPHYLOCOCCUS AUREUS         Radiology Studies: DG CHEST PORT 1 VIEW  Result Date: 08/12/2021 CLINICAL DATA:  PICC line placement. EXAM: PORTABLE CHEST 1 VIEW COMPARISON:  Radiographs 08/11/2021, 08/09/2021 and 08/07/2021. CTA 08/05/2021. FINDINGS: 1332 hours. Left arm PICC projects over the left perihilar region, which in correlation with recent CTA is at the level of Shelle Galdamez persistent left-sided SVC. Stable mild cardiomegaly post median sternotomy and CABG. The diffuse bilateral pulmonary opacities demonstrate further partial clearing. There is no confluent airspace opacity, pneumothorax or significant pleural effusion. The bones appear unchanged. IMPRESSION: 1. Tip of the left arm PICC projects over the mid aspect of Gates Jividen persistent left-sided SVC seen on prior CT. 2. Improving bilateral airspace opacities consistent with improving COVID-19 infection. Electronically Signed   By: Richardean Sale M.D.   On: 08/12/2021 14:07   DG CHEST PORT 1 VIEW  Result Date: 08/11/2021 CLINICAL DATA:  Respiratory  failure EXAM: PORTABLE CHEST 1 VIEW COMPARISON:  08/09/2021 FINDINGS: Multifocal patchy opacities with relative sparing of the left upper lobe, reflecting multifocal pneumonia, grossly unchanged. No pleural effusion or pneumothorax. Mild cardiomegaly.  Postsurgical changes related to prior CABG. Median sternotomy. IMPRESSION: Multifocal pneumonia, grossly unchanged. Electronically Signed   By: Julian Hy M.D.   On: 08/11/2021 06:57   [place] EKG SITE RITE  Result Date: 08/11/2021 If Site Rite image not attached, placement could not be confirmed due to current cardiac rhythm.       Scheduled Meds:  (feeding supplement) PROSource Plus  30 mL Oral BID BM   arformoterol  15 mcg Nebulization BID   vitamin C  500 mg Oral Daily   aspirin  81 mg Oral Daily   budesonide  2 mL Nebulization BID   calcitRIOL  0.25 mcg Oral Q3 days   Chlorhexidine Gluconate Cloth  6 each Topical Daily   clopidogrel  75 mg Oral Daily   feeding supplement (GLUCERNA SHAKE)  237 mL Oral  TID BM   fluticasone  2 spray Each Nare Daily   furosemide  40 mg Intravenous Q6H   guaiFENesin-dextromethorphan  10 mL Oral QID   insulin aspart  0-20 Units Subcutaneous TID WC   insulin aspart  0-5 Units Subcutaneous QHS   insulin aspart  8 Units Subcutaneous TID WC   [START ON 08/13/2021] insulin glargine-yfgn  25 Units Subcutaneous Daily   isosorbide mononitrate  90 mg Oral Daily   lamoTRIgine  25 mg Oral QHS   linagliptin  5 mg Oral Daily   loratadine  10 mg Oral Daily   magic mouthwash  5 mL Oral QID   mouth rinse  15 mL Mouth Rinse BID   methylPREDNISolone (SOLU-MEDROL) injection  50 mg Intravenous Q12H   Followed by   Derrill Memo ON 08/15/2021] predniSONE  40 mg Oral Q breakfast   Followed by   Derrill Memo ON 08/17/2021] predniSONE  30 mg Oral Q breakfast   Followed by   Derrill Memo ON 08/19/2021] predniSONE  10 mg Oral Q breakfast   Followed by   Derrill Memo ON 08/21/2021] predniSONE  10 mg Oral Q breakfast   metoprolol tartrate  25 mg  Oral BID   montelukast  10 mg Oral QHS   multivitamin with minerals  1 tablet Oral Daily   pantoprazole  40 mg Oral Daily   Ensure Max Protein  11 oz Oral Daily   rosuvastatin  20 mg Oral Daily   sertraline  100 mg Oral Daily   sodium chloride flush  10-40 mL Intracatheter Q12H   zinc sulfate  220 mg Oral Daily   Continuous Infusions:  doxycycline (VIBRAMYCIN) IV Stopped (08/12/21 1343)   heparin 700 Units/hr (08/12/21 1410)     LOS: 8 days    Time spent: over 30 min 40 min critical care time with AHRF 2/2 covid and mrsa pneumonia on HHFNC  Fayrene Helper, MD [facility]   To contact the attending provider between 7A-7P or the covering provider during after hours 7P-7A, please log into the web site [email or web address] and access using universal [place] password for that web site. If you do not have the password, please call the hospital operator.  08/12/2021, 5:09 PM

## 2021-08-12 NOTE — Progress Notes (Addendum)
Progress Note  Patient Name: Katherine [name] Date of Encounter: 08/12/2021  [facility] Cardiologist: [name], MD   Subjective   resting  Inpatient Medications    Scheduled Meds:  (feeding supplement) PROSource Plus  30 mL Oral BID BM   arformoterol  15 mcg Nebulization BID   vitamin C  500 mg Oral Daily   aspirin  81 mg Oral Daily   budesonide  2 mL Nebulization BID   calcitRIOL  0.25 mcg Oral Q3 days   Chlorhexidine Gluconate Cloth  6 each Topical Daily   clopidogrel  75 mg Oral Daily   feeding supplement (GLUCERNA SHAKE)  237 mL Oral Q24H   fluticasone  2 spray Each Nare Daily   guaiFENesin-dextromethorphan  10 mL Oral QID   insulin aspart  0-20 Units Subcutaneous TID WC   insulin aspart  0-5 Units Subcutaneous QHS   insulin aspart  6 Units Subcutaneous TID WC   insulin glargine-yfgn  23 Units Subcutaneous Daily   isosorbide mononitrate  90 mg Oral Daily   lamoTRIgine  25 mg Oral QHS   linagliptin  5 mg Oral Daily   loratadine  10 mg Oral Daily   magic mouthwash  5 mL Oral QID   mouth rinse  15 mL Mouth Rinse BID   methylPREDNISolone (SOLU-MEDROL) injection  50 mg Intravenous BID   metoprolol tartrate  25 mg Oral BID   montelukast  10 mg Oral QHS   multivitamin with minerals  1 tablet Oral Daily   pantoprazole  40 mg Oral Daily   Ensure Max Protein  11 oz Oral Daily   rosuvastatin  20 mg Oral Daily   sertraline  100 mg Oral Daily   zinc sulfate  220 mg Oral Daily   Continuous Infusions:  doxycycline (VIBRAMYCIN) IV Stopped (08/12/21 0350)   heparin 700 Units/hr (08/12/21 0735)   PRN Meds: acetaminophen **OR** acetaminophen, cyclobenzaprine, hydrALAZINE, ipratropium-albuterol, LORazepam, morphine injection, nitroGLYCERIN, ondansetron (ZOFRAN) IV, oxyCODONE, promethazine, sodium chloride   Vital Signs    Vitals:   08/12/21 0500 08/12/21 0600 08/12/21 0700 08/12/21 0745  BP: (!) 165/79 (!) 163/72 (!) 173/79   Pulse: (!) 57 (!) 58 (!) 59    Resp: 18 19 20    Temp:    (!) 97.5 F (36.4 C)  TempSrc:    Axillary  SpO2: 97% 95% 94%   Weight:   88.1 kg   Height:        Intake/Output Summary (Last 24 hours) at 08/12/2021 0750 Last data filed at 08/11/2021 1843 Gross per 24 hour  Intake 1322.71 ml  Output 2100 ml  Net -777.29 ml   Last 3 Weights 08/12/2021 08/11/2021 08/10/2021  Weight (lbs) 194 lb 3.6 oz 206 lb 2.1 oz 205 lb 11 oz  Weight (kg) 88.1 kg 93.5 kg 93.3 kg      Telemetry    SR to SB to 48 - Personally Reviewed  ECG    SR at 65 and LVH, no acute changes from 08/09/21 - Personally Reviewed  Physical Exam   Physical Exam: Blood pressure (!) 171/74, pulse 77, temperature (!) 97.5 F (36.4 C), temperature source Axillary, resp. rate 18, height 5\' 1"  (1.549 m), weight 88.1 kg, SpO2 90 %.  GEN:  middle age female,   appears older than stated age  54: Normal NECK: No JVD; No carotid bruits LYMPHATICS: No lymphadenopathy CARDIAC: RRR   RESPIRATORY:  clear posteriorly  ABDOMEN: Soft, non-tender, non-distended MUSCULOSKELETAL:  No edema; No deformity  SKIN: Warm and dry NEUROLOGIC:  Alert and oriented x 3    Labs    High Sensitivity Troponin:   Recent Labs  Lab 08/10/21 0515 08/11/21 0539 08/11/21 1040 08/11/21 1431 08/12/21 0311  TROPONINIHS 4,125* 1,660* 1,171* 877* 926*     Chemistry Recent Labs  Lab 08/10/21 0515 08/11/21 0309 08/12/21 0312  NA 138 137 133*  K 3.4* 4.3 4.1  CL 99 98 96*  CO2 31 30 28   GLUCOSE 119* 211* 249*  BUN 38* 41* 44*  CREATININE 0.77 0.87 0.93  CALCIUM 8.4* 8.7* 8.8*  MG 2.1 2.2 1.9  PROT 5.9* 6.4* 6.1*  ALBUMIN 2.3* 2.6* 2.4*  AST 27 23 18   ALT 11 13 14   ALKPHOS 99 115 129*  BILITOT 0.5 0.6 0.8  GFRNONAA >60 >60 >60  ANIONGAP 8 9 9     Lipids No results for input(s): CHOL, TRIG, HDL, LABVLDL, LDLCALC, CHOLHDL in the last 168 hours.  Hematology Recent Labs  Lab 08/10/21 0515 08/11/21 0309 08/12/21 0312  WBC 9.2 8.4 8.0  RBC 3.35* 3.64*  3.54*  HGB 10.6* 11.6* 11.1*  HCT 32.0* 35.0* 33.1*  MCV 95.5 96.2 93.5  MCH 31.6 31.9 31.4  MCHC 33.1 33.1 33.5  RDW 15.3 15.5 14.9  PLT 315 332 318   Thyroid No results for input(s): TSH, FREET4 in the last 168 hours.  BNP Recent Labs  Lab 08/09/21 0700 08/10/21 0515 08/11/21 0309  BNP 551.0* 320.3* 202.5*    DDimer  Recent Labs  Lab 08/09/21 0253 08/10/21 0515 08/11/21 0309  DDIMER 2.19* 2.01* 1.43*     Radiology    DG CHEST PORT 1 VIEW  Result Date: 08/11/2021 CLINICAL DATA:  Respiratory failure EXAM: PORTABLE CHEST 1 VIEW COMPARISON:  08/09/2021 FINDINGS: Multifocal patchy opacities with relative sparing of the left upper lobe, reflecting multifocal pneumonia, grossly unchanged. No pleural effusion or pneumothorax. Mild cardiomegaly.  Postsurgical changes related to prior CABG. Median sternotomy. IMPRESSION: Multifocal pneumonia, grossly unchanged. Electronically Signed   By: Julian Hy M.D.   On: 08/11/2021 06:57   [place] EKG SITE RITE  Result Date: 08/11/2021 If Site Rite image not attached, placement could not be confirmed due to current cardiac rhythm.   Cardiac Studies   Echo 08/09/21  IMPRESSIONS     1. Left ventricular ejection fraction, by estimation, is 45 to 50%. The  left ventricle has mildly decreased function. The left ventricle  demonstrates regional wall motion abnormalities (see scoring  diagram/findings for description). There is mild left  ventricular hypertrophy. Left ventricular diastolic function could not be  evaluated.   2. Right ventricular systolic function is normal. The right ventricular  size is normal.   3. The mitral valve is grossly normal. Trivial mitral valve  regurgitation.   4. The aortic valve has an indeterminant number of cusps. There is  moderate calcification of the aortic valve. Aortic valve regurgitation is  not visualized. Aortic valve sclerosis/calcification is present, without  any evidence of aortic  stenosis.   Comparison(s): Prior images reviewed side by side. Wall motion more  similar to last study with Definity contrast on 07/12/2020, although LVEF  somewhat better.   FINDINGS   Left Ventricle: Left ventricular ejection fraction, by estimation, is 45  to 50%. The left ventricle has mildly decreased function. The left  ventricle demonstrates regional wall motion abnormalities. Definity  contrast agent was given IV to delineate the  left ventricular endocardial borders. The left ventricular internal cavity  size was normal  in size. There is mild left ventricular hypertrophy. Left  ventricular diastolic function could not be evaluated.      LV Wall Scoring:  The apex is dyskinetic. The apical septal segment is hypokinetic. The  entire  anterior wall, entire lateral wall, anterior septum, entire inferior wall,  mid inferoseptal segment, and basal inferoseptal segment are normal.   Right Ventricle: The right ventricular size is normal. No increase in  right ventricular wall thickness. Right ventricular systolic function is  normal.   Left Atrium: Left atrial size was normal in size.   Right Atrium: Right atrial size was normal in size.   Pericardium: There is no evidence of pericardial effusion.   Mitral Valve: The mitral valve is grossly normal. Mild mitral annular  calcification. Trivial mitral valve regurgitation.   Tricuspid Valve: The tricuspid valve is grossly normal. Tricuspid valve  regurgitation is mild.   Aortic Valve: The aortic valve has an indeterminant number of cusps. There  is moderate calcification of the aortic valve. Aortic valve regurgitation  is not visualized. Aortic valve sclerosis/calcification is present,  without any evidence of aortic  stenosis.   Pulmonic Valve: The pulmonic valve was grossly normal. Pulmonic valve  regurgitation is trivial.   Aorta: The aortic root is normal in size and structure.   IAS/Shunts: The interatrial septum  was not well visualized.      LEFT VENTRICLE  PLAX 2D  LVIDd:         4.40 cm  LVIDs:         3.30 cm  LV PW:         1.00 cm  LV IVS:        1.10 cm  LVOT diam:     2.00 cm  LVOT Area:     3.14 cm      Patient Profile     65 y.o. 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 Failure with COVID-19 and MRSA PNA seen by cardiology for chest pain and abnormal troponins.  Assessment & Plan      1.  NSTEMI with recent recurrent angina symptoms in the setting of acute illness.  High-sensitivity troponin I up to 4125.  ECG shows inferior and anterolateral T wave inversions.  On ASA, plavix, lopressor, imdur, crestor and IV heparin on heparin and off eliquis since 12/24.   Ultimately consider a follow up diagnostic cath depending on course.    2.  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   3.  Component of acute on chronic systolic heart failure (echo this admit EF 45-50% in 01/2021 EF was 55-60%). Rec'd IV lasix yesterday and neg 1100 yesterday.  Had repeat dose yesterday BID and now neg 4,873 since admit and  neg 777 in last 24 hours.   Wt down from pk of 93.9 kg to 88.1 Kg (loss of 12.76 lbs)  one wt listed of 108.9 kg not thought to be accurate.    4.  Paroxysmal atrial fibrillation with CHA2DS2-VASc score of 6.  Transition from Eliquis to heparin in the setting of NSTEMI.  She is in sinus rhythm at this time.   5.  Acute on chronic hypoxic respiratory failure with COPD at baseline with home 02, currently with COVID-19 and MRSA pneumonia. She has completed  remdesivir and is on IV steroids and doxycycline.   6.  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       For questions or updates, please contact [place] Please consult [email or web address]  for contact info under        Signed, Cecilie Kicks, NP  08/12/2021, 7:50 AM    Attending Note:   The patient was seen and examined.  Agree with assessment and plan as noted above.  Changes made to the above note as needed.  Patient seen and independently examined with Cecilie Kicks, NP .   We discussed all aspects of the encounter. I agree with the assessment and plan as stated above.    CAD :   hx of CABG and stenting in the past. Presented with a NSTEMI in the setting of covid  I agree with plans for cath  I have spoken to Dr. Florene Glen.   Will try to continue to reduce her oxygen demand  Possibly will try for Thursday or Friday .  I have discussed the risks, benefits, options.  She understands and agrees to proceed.   2.  COVID.  Has completed course of remdisivir Minimal symptoms at this point   3.  Acute Chronic systolic CHF:   Ef 15%.     I have spent a total of 40 minutes with patient reviewing hospital  notes , telemetry, EKGs, labs and examining patient as well as establishing an assessment and plan that was discussed with the patient.  > 50% of time was spent in direct patient care.    Thayer Headings, Brooke Bonito., MD, [facility] 08/12/2021, 11:11 AM [address],  [place] Pager [phone]

## 2021-08-12 NOTE — Progress Notes (Signed)
ANTICOAGULATION CONSULT NOTE  Pharmacy Consult for Heparin Indication: chest pain/ACS  Allergies  Allergen Reactions   Tetracycline Hives    Patient tolerated Doxycycline Dec 2020   Niacin Other (See Comments)    Mouth blisters   Niaspan [Niacin Er] Other (See Comments)    Mouth blisters   Sulfa Antibiotics Nausea Only and Other (See Comments)    "Tears up stomach"   Sulfonamide Derivatives Other (See Comments)    Reaction: per patient "tears her stomach up"   Codeine Nausea And Vomiting   Erythromycin Nausea And Vomiting   Hydromorphone Hcl Nausea And Vomiting   Morphine And Related Nausea And Vomiting   Nalbuphine Nausea And Vomiting    Nubain   Sulfasalazine Nausea Only and Other (See Comments)    per patient "tears her stomach up", "Tears up stomach"   Tape Rash and Other (See Comments)    No "plastic" tape," please----cloth tape only    Patient Measurements: Height: 5\' 1"  (154.9 cm) Weight: 93.5 kg (206 lb 2.1 oz) IBW/kg (Calculated) : 47.8 Heparin Dosing Weight: 66kg  Vital Signs: Temp: 98.3 F (36.8 C) (12/27 0000) Temp Source: Axillary (12/27 0000) BP: 141/71 (12/26 2300) Pulse Rate: 75 (12/26 2300)  Labs: Recent Labs    08/09/21 0253 08/09/21 0921 08/09/21 2050 08/10/21 0515 08/10/21 1400 08/11/21 0309 08/11/21 0539 08/11/21 1040 08/11/21 1431 08/11/21 2231  HGB 11.2*  --   --  10.6*  --  11.6*  --   --   --   --   HCT 34.7*  --   --  32.0*  --  35.0*  --   --   --   --   PLT 275  --   --  315  --  332  --   --   --   --   APTT  --    < > 25 74*   < > 105*  --   --  115* 73*  HEPARINUNFRC  --   --  >1.10*  --   --  >1.10*  --   --   --   --   CREATININE 0.78  --   --  0.77  --  0.87  --   --   --   --   TROPONINIHS  --    < >  --  4,125*  --   --  1,660* 1,171* 877*  --    < > = values in this interval not displayed.      Medications:  Infusions:   doxycycline (VIBRAMYCIN) IV Stopped (08/11/21 1533)   heparin 700 Units/hr (08/11/21 1843)     Assessment: 65 yoF, Covid +, requiring high O2 administration. Complaint of chest pain, Troponin elevated.  PTA Apixaban for Afib transitioned to heparin infusion in the setting of NSTEMI.   - Last dose Apixaban 12/24 at 0900.  Note that Apixaban will artificially elevate Hep levels, will dose based on aPTT until both levels correlate Troponin: 16, 150, 928, 2835, 4125 Heparin level falsely elevated due to recent apixaban.  aPTT 73 therapeutic on 700 units/hr No bleeding reported  Goal of Therapy:  Heparin level 0.3-0.7 units/ml aPTT 66-102 seconds Monitor platelets by anticoagulation protocol: Yes   Plan:  Continue heparin drip at 700 units/hour Daily aPTT, heparin level, and CBC Continue to monitor H&H and platelets  Dolly Rias RPh 08/12/2021, 12:18 AM

## 2021-08-12 NOTE — Progress Notes (Addendum)
ANTICOAGULATION CONSULT NOTE  Pharmacy Consult for Heparin Indication: chest pain/ACS  Allergies  Allergen Reactions   Tetracycline Hives    Patient tolerated Doxycycline Dec 2020   Niacin Other (See Comments)    Mouth blisters   Niaspan [Niacin Er] Other (See Comments)    Mouth blisters   Sulfa Antibiotics Nausea Only and Other (See Comments)    "Tears up stomach"   Sulfonamide Derivatives Other (See Comments)    Reaction: per patient "tears her stomach up"   Codeine Nausea And Vomiting   Erythromycin Nausea And Vomiting   Hydromorphone Hcl Nausea And Vomiting   Morphine And Related Nausea And Vomiting   Nalbuphine Nausea And Vomiting    Nubain   Sulfasalazine Nausea Only and Other (See Comments)    per patient "tears her stomach up", "Tears up stomach"   Tape Rash and Other (See Comments)    No "plastic" tape," please----cloth tape only    Patient Measurements: Height: 5\' 1"  (154.9 cm) Weight: 93.5 kg (206 lb 2.1 oz) IBW/kg (Calculated) : 47.8 Heparin Dosing Weight: 66kg  Vital Signs: Temp: 98.3 F (36.8 C) (12/27 0000) Temp Source: Axillary (12/27 0000) BP: 173/79 (12/27 0700) Pulse Rate: 59 (12/27 0700)  Labs: Recent Labs    08/09/21 2050 08/09/21 2050 08/10/21 0515 08/10/21 1400 08/11/21 0309 08/11/21 0539 08/11/21 1040 08/11/21 1431 08/11/21 2231 08/12/21 0311 08/12/21 0312  HGB  --    < > 10.6*  --  11.6*  --   --   --   --   --  11.1*  HCT  --   --  32.0*  --  35.0*  --   --   --   --   --  33.1*  PLT  --   --  315  --  332  --   --   --   --   --  318  APTT 25  --  74*   < > 105*  --   --  115* 73*  --  70*  HEPARINUNFRC >1.10*  --   --   --  >1.10*  --   --   --   --   --  0.77*  CREATININE  --   --  0.77  --  0.87  --   --   --   --   --  0.93  TROPONINIHS  --   --  4,125*  --   --    < > 1,171* 877*  --  926*  --    < > = values in this interval not displayed.      Medications:  Infusions:   doxycycline (VIBRAMYCIN) IV Stopped  (08/12/21 0350)   heparin 700 Units/hr (08/11/21 1843)    Assessment: 37 yoF, Covid +, requiring high O2 administration. Complaint of chest pain, Troponin elevated.  PTA Apixaban for Afib transitioned to heparin infusion in the setting of NSTEMI.   - Last dose Apixaban 12/24 at 0900.  Note that Apixaban will artificially elevate Hep levels, will dose based on aPTT until both levels correlate Troponin: 16, 150, 928, 2835, peak at 4125 >> now downtrending  Heparin level falsely elevated due to recent apixaban.  Today, 08/12/21 - aPTT therapeutic at 70 seconds on heparin 700 units/hr  - Heparin level elevated at 0.77 due to effects from apixaban, however is downtrending - CBC remains stable - Discussed with Cardiology NP, may require cardiac cath so keep on IV heparin at this time  Goal of Therapy:  Heparin level 0.3-0.7 units/ml aPTT 66-102 seconds Monitor platelets by anticoagulation protocol: Yes   Plan:  Continue heparin drip at 700 units/hour Daily aPTT, heparin level, and CBC Continue to monitor H&H and platelets F/u further cardiac work-up and ability to transition back to [place], PharmD 08/12/2021 7:07 AM

## 2021-08-13 ENCOUNTER — Encounter: Payer: Medicare Other | Admitting: Physical Medicine & Rehabilitation

## 2021-08-13 DIAGNOSIS — Z9861 Coronary angioplasty status: Secondary | ICD-10-CM | POA: Diagnosis not present

## 2021-08-13 DIAGNOSIS — U071 COVID-19: Secondary | ICD-10-CM | POA: Diagnosis not present

## 2021-08-13 DIAGNOSIS — J9621 Acute and chronic respiratory failure with hypoxia: Secondary | ICD-10-CM | POA: Diagnosis not present

## 2021-08-13 DIAGNOSIS — I2510 Atherosclerotic heart disease of native coronary artery without angina pectoris: Secondary | ICD-10-CM | POA: Diagnosis not present

## 2021-08-13 LAB — GLUCOSE, CAPILLARY
Glucose-Capillary: 200 mg/dL — ABNORMAL HIGH (ref 70–99)
Glucose-Capillary: 327 mg/dL — ABNORMAL HIGH (ref 70–99)
Glucose-Capillary: 223 mg/dL — ABNORMAL HIGH (ref 70–99)
Glucose-Capillary: 193 mg/dL — ABNORMAL HIGH (ref 70–99)

## 2021-08-13 LAB — BLOOD GAS, ARTERIAL
Acid-Base Excess: 6.1 mmol/L — ABNORMAL HIGH (ref 0.0–2.0)
Bicarbonate: 30 mmol/L — ABNORMAL HIGH (ref 20.0–28.0)
Drawn by: 331471
FIO2: 40
O2 Saturation: 93.6 %
Patient temperature: 98.6
pCO2 arterial: 41.6 mmHg (ref 32.0–48.0)
pH, Arterial: 7.471 — ABNORMAL HIGH (ref 7.350–7.450)
pO2, Arterial: 69.5 mmHg — ABNORMAL LOW (ref 83.0–108.0)

## 2021-08-13 LAB — COMPREHENSIVE METABOLIC PANEL
ALT: 15 U/L (ref 0–44)
AST: 16 U/L (ref 15–41)
Albumin: 2.4 g/dL — ABNORMAL LOW (ref 3.5–5.0)
Alkaline Phosphatase: 138 U/L — ABNORMAL HIGH (ref 38–126)
Anion gap: 10 (ref 5–15)
BUN: 47 mg/dL — ABNORMAL HIGH (ref 8–23)
CO2: 29 mmol/L (ref 22–32)
Calcium: 9.1 mg/dL (ref 8.9–10.3)
Chloride: 92 mmol/L — ABNORMAL LOW (ref 98–111)
Creatinine, Ser: 0.89 mg/dL (ref 0.44–1.00)
GFR, Estimated: >60 mL/min (ref >60)
Glucose, Bld: 189 mg/dL — ABNORMAL HIGH (ref 70–99)
Potassium: 3.6 mmol/L (ref 3.5–5.1)
Sodium: 131 mmol/L — ABNORMAL LOW (ref 135–145)
Total Bilirubin: 0.7 mg/dL (ref 0.3–1.2)
Total Protein: 6.1 g/dL — ABNORMAL LOW (ref 6.5–8.1)

## 2021-08-13 LAB — APTT: aPTT: 46 seconds — ABNORMAL HIGH (ref 24–36)

## 2021-08-13 LAB — PROCALCITONIN: Procalcitonin: <0.1 ng/mL

## 2021-08-13 LAB — MAGNESIUM: Magnesium: 1.9 mg/dL (ref 1.7–2.4)

## 2021-08-13 LAB — HEPARIN LEVEL (UNFRACTIONATED): Heparin Unfractionated: 0.59 IU/mL (ref 0.30–0.70)

## 2021-08-13 LAB — PHOSPHORUS: Phosphorus: 3.5 mg/dL (ref 2.5–4.6)

## 2021-08-13 LAB — BRAIN NATRIURETIC PEPTIDE: B Natriuretic Peptide: 331.3 pg/mL — ABNORMAL HIGH (ref 0.0–100.0)

## 2021-08-13 LAB — C-REACTIVE PROTEIN: CRP: 1.1 mg/dL — ABNORMAL HIGH (ref <1.0)

## 2021-08-13 LAB — D-DIMER, QUANTITATIVE: D-Dimer, Quant: 0.93 ug/mL-FEU — ABNORMAL HIGH (ref 0.00–0.50)

## 2021-08-13 MED ORDER — BISACODYL 10 MG RE SUPP
10.0000 mg | Freq: Once | RECTAL | Status: AC
  Administered 2021-08-13: 12:00:00 10 mg via RECTAL
  Filled 2021-08-13: qty 1

## 2021-08-13 MED ORDER — MENTHOL 3 MG MT LOZG
1.0000 | LOZENGE | OROMUCOSAL | Status: DC | PRN
  Filled 2021-08-13: qty 9

## 2021-08-13 MED ORDER — SENNOSIDES-DOCUSATE SODIUM 8.6-50 MG PO TABS
2.0000 | ORAL_TABLET | Freq: Two times a day (BID) | ORAL | Status: DC
  Administered 2021-08-13 – 2021-08-25 (×25): 2 via ORAL
  Filled 2021-08-13 (×25): qty 2

## 2021-08-13 NOTE — Progress Notes (Addendum)
Progress Note  Patient Name: Katherine [name] Date of Encounter: 08/13/2021  [facility] Cardiologist: [name], MD   Subjective   Resting   Inpatient Medications    Scheduled Meds:  (feeding supplement) PROSource Plus  30 mL Oral BID BM   arformoterol  15 mcg Nebulization BID   vitamin C  500 mg Oral Daily   aspirin  81 mg Oral Daily   budesonide  2 mL Nebulization BID   calcitRIOL  0.25 mcg Oral Q3 days   Chlorhexidine Gluconate Cloth  6 each Topical Daily   clopidogrel  75 mg Oral Daily   feeding supplement (GLUCERNA SHAKE)  237 mL Oral TID BM   fluticasone  2 spray Each Nare Daily   furosemide  40 mg Intravenous BID   guaiFENesin-dextromethorphan  10 mL Oral QID   insulin aspart  0-20 Units Subcutaneous TID WC   insulin aspart  0-5 Units Subcutaneous QHS   insulin aspart  8 Units Subcutaneous TID WC   insulin glargine-yfgn  25 Units Subcutaneous Daily   isosorbide mononitrate  90 mg Oral Daily   lamoTRIgine  25 mg Oral QHS   linagliptin  5 mg Oral Daily   loratadine  10 mg Oral Daily   magic mouthwash  5 mL Oral QID   mouth rinse  15 mL Mouth Rinse BID   methylPREDNISolone (SOLU-MEDROL) injection  50 mg Intravenous Q12H   Followed by   Derrill Memo ON 08/15/2021] predniSONE  40 mg Oral Q breakfast   Followed by   Derrill Memo ON 08/17/2021] predniSONE  30 mg Oral Q breakfast   Followed by   Derrill Memo ON 08/19/2021] predniSONE  10 mg Oral Q breakfast   Followed by   Derrill Memo ON 08/21/2021] predniSONE  10 mg Oral Q breakfast   metoprolol tartrate  25 mg Oral BID   montelukast  10 mg Oral QHS   multivitamin with minerals  1 tablet Oral Daily   pantoprazole  40 mg Oral Daily   Ensure Max Protein  11 oz Oral Daily   rosuvastatin  20 mg Oral Daily   sertraline  100 mg Oral Daily   sodium chloride flush  10-40 mL Intracatheter Q12H   zinc sulfate  220 mg Oral Daily   Continuous Infusions:  doxycycline (VIBRAMYCIN) IV Stopped (08/13/21 0420)   heparin 700 Units/hr  (08/13/21 0517)   PRN Meds: acetaminophen **OR** acetaminophen, cyclobenzaprine, hydrALAZINE, ipratropium-albuterol, LORazepam, menthol-cetylpyridinium, morphine injection, nitroGLYCERIN, ondansetron (ZOFRAN) IV, oxyCODONE, promethazine, sodium chloride, sodium chloride flush   Vital Signs    Vitals:   08/13/21 0500 08/13/21 0517 08/13/21 0600 08/13/21 0700  BP: (!) 141/79  (!) 141/99 135/63  Pulse: 70  64 70  Resp: (!) 21  (!) 23 20  Temp:      TempSrc:      SpO2: (!) 84%  (!) 81% 95%  Weight:  86.5 kg    Height:        Intake/Output Summary (Last 24 hours) at 08/13/2021 0751 Last data filed at 08/13/2021 0600 Gross per 24 hour  Intake 1591.88 ml  Output 2900 ml  Net -1308.12 ml   Last 3 Weights 08/13/2021 08/12/2021 08/11/2021  Weight (lbs) 190 lb 11.2 oz 194 lb 3.6 oz 206 lb 2.1 oz  Weight (kg) 86.5 kg 88.1 kg 93.5 kg      Telemetry    SR to SB - Personally Reviewed  ECG    No new - Personally Reviewed  Physical Exam  Physical Exam:  Blood pressure 130/69, pulse 78, temperature 98.5 F (36.9 C), temperature source Axillary, resp. rate (!) 21, height 5\' 1"  (1.549 m), weight 86.5 kg, SpO2 90 %.  GEN:  middle age female,  appears older than stated age.   NAD  HEENT: Normal NECK: No JVD; No carotid bruits LYMPHATICS: No lymphadenopathy CARDIAC: RRR   RESPIRATORY:    ABDOMEN: Soft, non-tender, non-distended MUSCULOSKELETAL:  No edema; No deformity  SKIN: Warm and dry NEUROLOGIC:  Alert and oriented x 3    Labs    High Sensitivity Troponin:   Recent Labs  Lab 08/10/21 0515 08/11/21 0539 08/11/21 1040 08/11/21 1431 08/12/21 0311  TROPONINIHS 4,125* 1,660* 1,171* 877* 926*     Chemistry Recent Labs  Lab 08/11/21 0309 08/12/21 0312 08/13/21 0410  NA 137 133* 131*  K 4.3 4.1 3.6  CL 98 96* 92*  CO2 30 28 29   GLUCOSE 211* 249* 189*  BUN 41* 44* 47*  CREATININE 0.87 0.93 0.89  CALCIUM 8.7* 8.8* 9.1  MG 2.2 1.9 1.9  PROT 6.4* 6.1* 6.1*   ALBUMIN 2.6* 2.4* 2.4*  AST 23 18 16   ALT 13 14 15   ALKPHOS 115 129* 138*  BILITOT 0.6 0.8 0.7  GFRNONAA >60 >60 >60  ANIONGAP 9 9 10     Lipids No results for input(s): CHOL, TRIG, HDL, LABVLDL, LDLCALC, CHOLHDL in the last 168 hours.  Hematology Recent Labs  Lab 08/10/21 0515 08/11/21 0309 08/12/21 0312  WBC 9.2 8.4 8.0  RBC 3.35* 3.64* 3.54*  HGB 10.6* 11.6* 11.1*  HCT 32.0* 35.0* 33.1*  MCV 95.5 96.2 93.5  MCH 31.6 31.9 31.4  MCHC 33.1 33.1 33.5  RDW 15.3 15.5 14.9  PLT 315 332 318   Thyroid No results for input(s): TSH, FREET4 in the last 168 hours.  BNP Recent Labs  Lab 08/11/21 0309 08/12/21 0312 08/13/21 0457  BNP 202.5* 194.7* 331.3*    DDimer  Recent Labs  Lab 08/10/21 0515 08/11/21 0309 08/13/21 0410  DDIMER 2.01* 1.43* 0.93*     Radiology    DG CHEST PORT 1 VIEW  Result Date: 08/12/2021 CLINICAL DATA:  PICC line placement. EXAM: PORTABLE CHEST 1 VIEW COMPARISON:  Radiographs 08/11/2021, 08/09/2021 and 08/07/2021. CTA 08/05/2021. FINDINGS: 1332 hours. Left arm PICC projects over the left perihilar region, which in correlation with recent CTA is at the level of a persistent left-sided SVC. Stable mild cardiomegaly post median sternotomy and CABG. The diffuse bilateral pulmonary opacities demonstrate further partial clearing. There is no confluent airspace opacity, pneumothorax or significant pleural effusion. The bones appear unchanged. IMPRESSION: 1. Tip of the left arm PICC projects over the mid aspect of a persistent left-sided SVC seen on prior CT. 2. Improving bilateral airspace opacities consistent with improving COVID-19 infection. Electronically Signed   By: Richardean Sale M.D.   On: 08/12/2021 14:07   [place] EKG SITE RITE  Result Date: 08/11/2021 If Site Rite image not attached, placement could not be confirmed due to current cardiac rhythm.   Cardiac Studies   Echo 08/09/21  IMPRESSIONS     1. Left ventricular ejection fraction, by  estimation, is 45 to 50%. The  left ventricle has mildly decreased function. The left ventricle  demonstrates regional wall motion abnormalities (see scoring  diagram/findings for description). There is mild left  ventricular hypertrophy. Left ventricular diastolic function could not be  evaluated.   2. Right ventricular systolic function is normal. The right ventricular  size is normal.   3. The mitral  valve is grossly normal. Trivial mitral valve  regurgitation.   4. The aortic valve has an indeterminant number of cusps. There is  moderate calcification of the aortic valve. Aortic valve regurgitation is  not visualized. Aortic valve sclerosis/calcification is present, without  any evidence of aortic stenosis.   Comparison(s): Prior images reviewed side by side. Wall motion more  similar to last study with Definity contrast on 07/12/2020, although LVEF  somewhat better.   FINDINGS   Left Ventricle: Left ventricular ejection fraction, by estimation, is 45  to 50%. The left ventricle has mildly decreased function. The left  ventricle demonstrates regional wall motion abnormalities. Definity  contrast agent was given IV to delineate the  left ventricular endocardial borders. The left ventricular internal cavity  size was normal in size. There is mild left ventricular hypertrophy. Left  ventricular diastolic function could not be evaluated.      LV Wall Scoring:  The apex is dyskinetic. The apical septal segment is hypokinetic. The  entire  anterior wall, entire lateral wall, anterior septum, entire inferior wall,  mid inferoseptal segment, and basal inferoseptal segment are normal.   Right Ventricle: The right ventricular size is normal. No increase in  right ventricular wall thickness. Right ventricular systolic function is  normal.   Left Atrium: Left atrial size was normal in size.   Right Atrium: Right atrial size was normal in size.   Pericardium: There is no evidence of  pericardial effusion.   Mitral Valve: The mitral valve is grossly normal. Mild mitral annular  calcification. Trivial mitral valve regurgitation.   Tricuspid Valve: The tricuspid valve is grossly normal. Tricuspid valve  regurgitation is mild.   Aortic Valve: The aortic valve has an indeterminant number of cusps. There  is moderate calcification of the aortic valve. Aortic valve regurgitation  is not visualized. Aortic valve sclerosis/calcification is present,  without any evidence of aortic  stenosis.   Pulmonic Valve: The pulmonic valve was grossly normal. Pulmonic valve  regurgitation is trivial.   Aorta: The aortic root is normal in size and structure.   IAS/Shunts: The interatrial septum was not well visualized.      LEFT VENTRICLE  PLAX 2D  LVIDd:         4.40 cm  LVIDs:         3.30 cm  LV PW:         1.00 cm  LV IVS:        1.10 cm  LVOT diam:     2.00 cm  LVOT Area:     3.14 cm      Patient Profile     65 y.o. 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 Failure with COVID-19 and MRSA PNA seen by cardiology for chest pain and abnormal troponins.    Assessment & Plan    1.  NSTEMI with recent recurrent angina symptoms in the setting of acute illness.  High-sensitivity troponin I up to 4125.  ECG shows inferior and anterolateral T wave inversions.  On ASA, plavix, lopressor, imdur, crestor and IV heparin on heparin and off eliquis since 12/24.   Ultimately consider a follow up diagnostic cath depending on course, her 02 requirements are still elevated.  May need to wait until next week. Would need to continue heparin.  2.  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 See above   3.  Component of acute on chronic systolic heart failure (echo this admit EF 45-50%  in 01/2021 EF was 55-60%). Rec'd IV lasix over last several days and neg 1591 yesterday.    Wt down from pk of 93.9 kg to 86.5 Kg (loss of 16.28 lbs)  one wt listed of 108.9 kg not thought to be accurate.    4.  Paroxysmal atrial fibrillation with CHA2DS2-VASc score of 6.  Transition from Eliquis to heparin in the setting of NSTEMI.  She is in sinus rhythm at this time.   5.  Acute on chronic hypoxic respiratory failure with COPD at baseline with home 02, currently with COVID-19 and MRSA pneumonia. She has completed remdesivir and is on IV steroids and doxycycline.  Still requires high flow 02. Per IM "She's immunosuppressed on antirejection meds with renal transplant (not candidate for baricitinib or actemra with MRSA infection and immunosuppression).  Poor prognosis with immunosuppression and covid."   6.  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       For questions or updates, please contact [place] Please consult [email or web address] for contact info under        Signed, Cecilie Kicks, NP  08/13/2021, 7:51 AM    Attending Note:   The patient was seen and examined.  Agree with assessment and plan as noted above.  Changes made to the above note as needed.  Patient seen and independently examined with Cecilie Kicks, NP .   We discussed all aspects of the encounter. I agree with the assessment and plan as stated above.     NSTEMI:   still no CP.   Waiting for her oxygen requirements to go down before we attempt cath .  Stable from a cardiac standpoint .    I have spent a total of 40 minutes with patient reviewing hospital  notes , telemetry, EKGs, labs and examining patient as well as establishing an assessment and plan that was discussed with the patient.  > 50% of time was spent in direct patient care.    Thayer Headings, Brooke Bonito., MD, [facility] 08/13/2021, 11:51 AM [address]  [place] Pager [phone]

## 2021-08-13 NOTE — Progress Notes (Signed)
PROGRESS NOTE    Katherine [name]  YNW:[id number] DOB: [birth year] DOA: 08/04/2021 PCP: [name], MD  Chief Complaint  Patient presents with   generalized weakness    Brief Narrative:  This is a 65 year old female with anxiety, depression, CAD/CABG, chronic hypoxic respiratory failure related to COPD on 3 L of oxygen at home, type 2 diabetes mellitus, end-stage renal disease status post renal transplant, GERD, obesity, hypertension paroxysmal atrial fibrillation, PAD who presented to the hospital on 12/19 with shortness of breath and generalized weakness.  She was found to have a pulse ox of 84% on her usual oxygen, noted to be wheezing and had a cough with yellow sputum.  COVID PCR noted to be positive.  She's been admitted for COVID 19 pneumonia and now found to have MRSA pneumonia.  She's had progressively worsening O2 needs, now on heated high flow.    See below for additional details   08/13/2021: Patient was seen and examined at bedside.  Complains of a dry mouth.  Denies any chest pain.   Assessment & Plan:   Principal Problem:   Acute on chronic respiratory failure with hypoxia (HCC) Active Problems:   Hypomagnesemia   Dyslipidemia, goal LDL below 70   NSTEMI (non-ST elevated myocardial infarction) (HCC)   CAD S/P percutaneous coronary angioplasty - PCI x 5 to SVG-D1   Essential hypertension   PAD (peripheral artery disease) (HCC)   Paroxysmal atrial fibrillation (HCC); CHA2DS2VASc score F, HTN, CAD, CVA = 5   OSA (obstructive sleep apnea)   GAD (generalized anxiety disorder)   Acute CHF (congestive heart failure) ([place])   History of renal transplant   Uncontrolled type 2 diabetes mellitus with hyperglycemia, 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  Possible contribution from HF. Uses 2 LPM at home COVID test + on 12/18 CXR 12/24 with worsening severe diffuse bilateral airspace disease CT PE protocol chest 08/05/2021: limited for PE, but negative, extensive heterogenous and GGO throughout lungs  Satting in 90's, on 25 L HHFNC (slowly improving) Elevated BNP -> lasix as tolerated Completed remdesivir.  Continue IV steroids for covid and abx to cover for MRSA pneumonia She's immunosuppressed on antirejection meds with renal transplant (not candidate for baricitinib or actemra with MRSA infection and immunosuppression).  Poor prognosis with immunosuppression and covid.  COVID-19 Labs  Recent Labs    08/10/21 0515 08/11/21 0309  DDIMER 2.01* 1.43*  CRP  --  2.4*    COVID-19 Labs  Recent Labs    08/10/21 0515 08/11/21 0309  DDIMER 2.01* 1.43*  CRP  --  2.4*    Lab Results  Component Value Date   SARSCOV2NAA POSITIVE (A) 08/04/2021   [place] NEGATIVE 05/12/2021   [place] NEGATIVE 04/19/2021   [place] NEGATIVE 03/30/2021     Positive D dimer On anticoagulation Follow LE [place] - negative  MRSA pneumonia ([place]) 12/20 sputum cx with MRSA (and candida albicans - suspect this is contaminant/colonizer)  On doxycycline, plan for 7 days (12/23 - present) As above  Chronic diastolic CHF (congestive heart failure) ([place])- (present on admission) Grade 1 diastolic dysfunction  Obesity (BMI 30-39.9)- (present  on admission) Body mass index is 37.03 kg/m.  Placing the patient at high risk of poor outcome.  Monitor.  COVID-19 virus infection- (present on admission) As above Steroids, remdesivir (s/p 5 doses) Tapering steroids  Pressure injury of skin- (present on admission) Stage II, medial rectal ulcer. Present on admission. 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 Receives prednisone and Imuran. Placing the patient at high risk for poor outcome secondary to chronic immunosuppressive status  Acute CHF (congestive heart failure) (HCC) Elevated BNP Follow echo - EF 45-50%, RWMA (see report) Lasix as tolerated, follow renal function Strict I/O, daily weights - net negative  GAD (generalized anxiety disorder)- (present on admission) Continue Zoloft and Ativan.  OSA (obstructive sleep apnea)- (present on admission) Does not use CPAP at night after lap band.  Paroxysmal atrial fibrillation (HCC); CHA2DS2VASc score F, HTN, CAD, CVA = 5- (present on admission) Continue Metoprolol (dose reduced due to brady) and Eliquis -> now on heparin gtt  PAD (peripheral artery disease) ([place])- (present on admission) Prior history.  Monitor.  Essential hypertension Recently improved, follow Continue home BP meds, consider adding meds pending trend  CAD S/P percutaneous coronary angioplasty - PCI x 5 to SVG-D1 Prior history.  Monitor. W/u for chest pain above  NSTEMI (non-ST elevated myocardial infarction) ([place]) Episode of CP similar to prior heart attacks on 12/24 Continue nitro prn  Was already anticoagulated on eliquis, antiplatelet on plavix -> given concerning symptoms/rising troponin -> transitioned to heparin gtt, aspirin added to placix EF 45-50%, RWMA's (see report) Cardiology c/s, appreciate assistance -> plan for aspirin, plavix, imdur (increased dose), lopressor, crestor - consider arb/arni if BP remains high, consider cath pending clinical status (note 12/25) Would want her O2 needs to improve prior to diagnostic cath - still has significant O2 needs Ok to work with  PT/OT if asymptomatic  Dyslipidemia, goal LDL below 70- (present on admission) Continue statin.  Hypomagnesemia- (present on admission) Replaced.  Worsening hyponatremia, encourage increase oral intake Hypomagnesemia has resolved. Hyperglycemia is improving  Hypercarbia is improving     DVT prophylaxis: heparin gtt (previously eliquis) Code Status: full Family Communication: none at bedside - discussed with niece 12/24, no answer 12/25 Disposition:   Status is: Inpatient  Remains inpatient appropriate because: sig O2 requirement, NSTEMI       Consultants:  cardiology  Procedures:  echo IMPRESSIONS     1. Left ventricular ejection fraction, by estimation, is 45 to 50%. The  left ventricle has mildly decreased function. The left ventricle  demonstrates regional wall motion abnormalities (see scoring  diagram/findings for description). There is mild left  ventricular hypertrophy. Left ventricular diastolic function could not be  evaluated.   2. Right ventricular systolic function is normal. The right ventricular  size is normal.   3. The mitral valve is grossly normal. Trivial mitral valve  regurgitation.   4. The aortic valve has an indeterminant number of cusps. There is  moderate calcification of the aortic valve. Aortic valve regurgitation is  not visualized. Aortic valve sclerosis/calcification is present, without  any evidence of aortic stenosis.   Comparison(s): Prior images reviewed side by side. Wall motion more  similar to last study with Definity contrast on 07/12/2020, although LVEF  somewhat better.  Summary:  BILATERAL:  -No evidence of popliteal cyst, bilaterally.      Antimicrobials:  Anti-infectives (From admission, onward)    Start     Dose/Rate Route Frequency Ordered Stop   08/08/21 1400  doxycycline (VIBRAMYCIN) 100 mg in sodium chloride 0.9 % 250 mL IVPB        100 mg 125 mL/hr over 120 Minutes Intravenous Every 12 hours 08/08/21  1327 08/15/21 1414   08/06/21 1000  remdesivir 100 mg in sodium chloride 0.9 % 100 mL IVPB  Status:  Discontinued       See Hyperspace for full Linked Orders Report.   100 mg 200 mL/hr over 30 Minutes Intravenous Daily 08/05/21 0720 08/05/21 0720   08/06/21 1000  remdesivir 100 mg in sodium chloride 0.9 % 100 mL IVPB        100 mg 200 mL/hr over 30 Minutes Intravenous Daily 08/05/21 0721 08/09/21 0937   08/05/21 1000  remdesivir 100 mg in sodium chloride 0.9 % 100 mL IVPB  Status:  Discontinued       See Hyperspace for full Linked Orders Report.   100 mg 200 mL/hr over 30 Minutes Intravenous Daily 08/04/21 0819 08/05/21 0720   08/05/21 1000  remdesivir 200 mg in sodium chloride 0.9% 250 mL IVPB        200 mg 580 mL/hr over 30 Minutes Intravenous Once 08/05/21 0719 08/05/21 1150   08/05/21 1000  cefTRIAXone (ROCEPHIN) 2 g in sodium chloride 0.9 % 100 mL IVPB        2 g 200 mL/hr over 30 Minutes Intravenous Every 24 hours 08/05/21 0815 08/09/21 1032   08/05/21 1000  azithromycin (ZITHROMAX) 500 mg in sodium chloride 0.9 % 250 mL IVPB  Status:  Discontinued        500 mg 250 mL/hr over 60 Minutes Intravenous Every 24 hours 08/05/21 0815 08/05/21 0907   08/04/21 0930  remdesivir 200 mg in sodium chloride 0.9% 250 mL IVPB  Status:  Discontinued       See Hyperspace for full Linked Orders Report.   200 mg 580 mL/hr over 30 Minutes Intravenous Once 08/04/21 0819 08/05/21 0720        Objective: Vitals:   08/13/21 1000 08/13/21 1100 08/13/21 1200 08/13/21 1409  BP: (!) 149/90 130/69  103/63  Pulse: 80 78  81  Resp: (!) 23 (!) 21  20  Temp:   98.4 F (36.9 C)   TempSrc:   Oral   SpO2: 92% 90%  97%  Weight:      Height:        Intake/Output Summary (Last 24 hours) at 08/13/2021 1618 Last data filed at 08/13/2021 1603 Gross per 24 hour  Intake 1399.25 ml  Output 2200 ml  Net -800.75 ml   Filed Weights   08/11/21 0500 08/12/21 0700 08/13/21 0517  Weight: 93.5 kg 88.1 kg 86.5  kg    Examination:  General: Well-developed well-nourished no acute distress.  She is alert and oriented x3. Cardiovascular: Regular rate and rhythm no rubs or gallops.   Lungs: Mild rales at bases no wheezing noted.  Poor inspiratory effort.   Abdomen: Soft nontender bowel sounds present. Neurological: Nonfocal exam.  Moves all 4 extremities.   Skin: No rashes no ulcerative lesions noted. Extremities: No lower extremity edema bilaterally.  Data Reviewed: I have personally reviewed following labs and imaging studies  CBC: Recent Labs  Lab 08/08/21 0256 08/09/21 0253 08/10/21 0515 08/11/21 0309 08/12/21 0312  WBC 11.1*  10.1 9.2 8.4 8.0  NEUTROABS 10.4* 9.3* 8.4* 7.9* 7.3  HGB 10.7* 11.2* 10.6* 11.6* 11.1*  HCT 32.1* 34.7* 32.0* 35.0* 33.1*  MCV 95.8 98.0 95.5 96.2 93.5  PLT 282 275 315 332 409    Basic Metabolic Panel: Recent Labs  Lab 08/09/21 0253 08/10/21 0515 08/11/21 0309 08/12/21 0312 08/13/21 0410  NA 137 138 137 133* 131*  K 4.1 3.4* 4.3 4.1 3.6  CL 102 99 98 96* 92*  CO2 27 31 30 28 29   GLUCOSE 204* 119* 211* 249* 189*  BUN 34* 38* 41* 44* 47*  CREATININE 0.78 0.77 0.87 0.93 0.89  CALCIUM 8.7* 8.4* 8.7* 8.8* 9.1  MG 2.2 2.1 2.2 1.9 1.9  PHOS  --  3.7 3.1 3.1 3.5    GFR: Estimated Creatinine Clearance: 63.8 mL/min (by C-G formula based on SCr of 0.89 mg/dL).  Liver Function Tests: Recent Labs  Lab 08/09/21 0253 08/10/21 0515 08/11/21 0309 08/12/21 0312 08/13/21 0410  AST 15 27 23 18 16   ALT 10 11 13 14 15   ALKPHOS 117 99 115 129* 138*  BILITOT 0.6 0.5 0.6 0.8 0.7  PROT 6.4* 5.9* 6.4* 6.1* 6.1*  ALBUMIN 2.6* 2.3* 2.6* 2.4* 2.4*    CBG: Recent Labs  Lab 08/12/21 1325 08/12/21 1712 08/12/21 2129 08/13/21 0807 08/13/21 1205  GLUCAP 307* 305* 229* 200* 327*     Recent Results (from the past 240 hour(s))  Resp Panel by RT-PCR (Flu A&B, Covid) Nasopharyngeal Swab     Status: Abnormal   Collection Time: 08/04/21  3:22 AM   Specimen:  Nasopharyngeal Swab; Nasopharyngeal(NP) swabs in vial transport medium  Result Value Ref Range Status   SARS Coronavirus 2 by RT PCR POSITIVE (A) NEGATIVE Final    Comment: (NOTE) SARS-CoV-2 target nucleic acids are DETECTED.  The SARS-CoV-2 RNA is generally detectable in upper respiratory specimens during the acute phase of infection. Positive results are indicative of the presence of the identified virus, but do not rule out bacterial infection or co-infection with other pathogens not detected by the test. Clinical correlation with patient history and other diagnostic information is necessary to determine patient infection status. The expected result is Negative.  Fact Sheet for Patients: EntrepreneurPulse.com.au  Fact Sheet for Healthcare Providers: IncredibleEmployment.be  This test is not yet approved or cleared by the [place] FDA and  has been authorized for detection and/or diagnosis of SARS-CoV-2 by FDA under an Emergency Use Authorization (EUA).  This EUA will remain in effect (meaning this test can be used) for the duration of  the COVID-19 declaration under Section 564(b)(1) of the A ct, 21 U.S.C. section 360bbb-3(b)(1), unless the authorization is terminated or revoked sooner.     Influenza A by PCR NEGATIVE NEGATIVE Final   Influenza B by PCR NEGATIVE NEGATIVE Final    Comment: (NOTE) The Xpert Xpress SARS-CoV-2/FLU/RSV plus assay is intended as an aid in the diagnosis of influenza from Nasopharyngeal swab specimens and should not be used as a sole basis for treatment. Nasal washings and aspirates are unacceptable for Xpert Xpress SARS-CoV-2/FLU/RSV testing.  Fact Sheet for Patients: EntrepreneurPulse.com.au  Fact Sheet for Healthcare Providers: IncredibleEmployment.be  This test is not yet approved or cleared by the [place] FDA and has been authorized for detection and/or  diagnosis of SARS-CoV-2 by FDA under an Emergency Use Authorization (EUA). This EUA will remain in effect (meaning this test can be used) for the duration of the COVID-19 declaration under Section 564(b)(1) of the Act,  21 U.S.C. section 360bbb-3(b)(1), unless the authorization is terminated or revoked.  Performed at [facility], [place] [address] [place], [place] [address]   MRSA Next Gen by PCR, Nasal     Status: None   Collection Time: 08/04/21  4:36 PM   Specimen: Nasal Mucosa; Nasal Swab  Result Value Ref Range Status   MRSA by PCR Next Gen NOT DETECTED NOT DETECTED Final    Comment: (NOTE) The GeneXpert MRSA Assay (FDA approved for NASAL specimens only), is one component of a comprehensive MRSA colonization surveillance program. It is not intended to diagnose MRSA infection nor to guide or monitor treatment for MRSA infections. Test performance is not FDA approved in patients less than 2 years old. Performed at [facility], [place] [address], [place], [place] [address]   Culture, blood (routine x 2) Call MD if unable to obtain prior to antibiotics being given     Status: None   Collection Time: 08/05/21 10:12 AM   Specimen: BLOOD LEFT HAND  Result Value Ref Range Status   Specimen Description   Final    BLOOD LEFT HAND Performed at [facility] [address], [place], [place] [address]    Special Requests   Final    BOTTLES DRAWN AEROBIC ONLY Blood Culture results may not be optimal due to an inadequate volume of blood received in culture bottles Performed at [facility] [address] [place], [place] [address]    Culture   Final    NO GROWTH 5 DAYS Performed at [facility], [place] [address], [place], [place] [address]    Report Status 08/10/2021 FINAL  Final  Expectorated Sputum Assessment w Gram Stain, Rflx to Resp Cult     Status: None   Collection Time: 08/05/21 10:31 AM    Specimen: Expectorated Sputum  Result Value Ref Range Status   Specimen Description EXPECTORATED SPUTUM  Final   Special Requests NONE  Final   Sputum evaluation   Final    Sputum specimen not acceptable for testing.  Please recollect.   RESULT CALLED TO, READ BACK BY AND VERIFIED WITH: NOTIFY Joesphine Bare RN ON 07/2021 @ 1104 BY MECIAL J. Performed at [facility], [place] [address], [place], [place] [address]    Report Status 08/05/2021 FINAL  Final  Culture, blood (routine x 2) Call MD if unable to obtain prior to antibiotics being given     Status: None   Collection Time: 08/05/21 11:07 AM   Specimen: BLOOD RIGHT HAND  Result Value Ref Range Status   Specimen Description   Final    BLOOD RIGHT HAND Performed at [facility] [address] [place], [place] [address]    Special Requests   Final    BOTTLES DRAWN AEROBIC ONLY Blood Culture results may not be optimal due to an inadequate volume of blood received in culture bottles Performed at [place] [address], [place], [place] [address]    Culture   Final    NO GROWTH 5 DAYS Performed at [facility], [place] [address], [place], [place] [address]    Report Status 08/10/2021 FINAL  Final  Expectorated Sputum Assessment w Gram Stain, Rflx to Resp Cult     Status: None   Collection Time: 08/05/21  1:29 PM   Specimen: SPU  Result Value Ref Range Status   Specimen Description SPUTUM  Final   Special  Requests NONE  Final   Sputum evaluation   Final    THIS SPECIMEN IS ACCEPTABLE FOR SPUTUM CULTURE Performed at [facility] [address], [place], [place] [address]    Report Status 08/05/2021 FINAL  Final  Culture, Respiratory w Gram Stain     Status: None   Collection Time: 08/05/21  1:29 PM   Specimen: SPU  Result Value Ref Range Status   Specimen Description   Final    SPUTUM Performed at [place] [address], [place], [place] [address]    Special Requests   Final    NONE Reflexed from [id number] Performed at [facility], [place] [address] [place], [place] [address]    Gram Stain   Final    ABUNDANT WBC PRESENT, PREDOMINANTLY MONONUCLEAR MODERATE YEAST FEW GRAM POSITIVE RODS FEW GRAM POSITIVE COCCI Performed at [facility], [place] [address] [place], [place] [address]    Culture   Final    MODERATE METHICILLIN RESISTANT STAPHYLOCOCCUS AUREUS FEW CANDIDA ALBICANS    Report Status 08/08/2021 FINAL  Final   Organism ID, Bacteria METHICILLIN RESISTANT STAPHYLOCOCCUS AUREUS  Final      Susceptibility   Methicillin resistant staphylococcus aureus - MIC*    CIPROFLOXACIN >=8 RESISTANT Resistant     ERYTHROMYCIN >=8 RESISTANT Resistant     GENTAMICIN <=0.5 SENSITIVE Sensitive     OXACILLIN >=4 RESISTANT Resistant     TETRACYCLINE <=1 SENSITIVE Sensitive     VANCOMYCIN <=0.5 SENSITIVE Sensitive     TRIMETH/SULFA <=10 SENSITIVE Sensitive     CLINDAMYCIN >=8 RESISTANT Resistant     RIFAMPIN <=0.5 SENSITIVE Sensitive     Inducible Clindamycin NEGATIVE Sensitive     * MODERATE METHICILLIN RESISTANT STAPHYLOCOCCUS AUREUS         Radiology Studies: DG CHEST PORT 1 VIEW  Result Date: 08/12/2021 CLINICAL DATA:  PICC line placement. EXAM: PORTABLE CHEST 1 VIEW COMPARISON:  Radiographs 08/11/2021, 08/09/2021 and 08/07/2021. CTA 08/05/2021. FINDINGS: 1332 hours. Left arm PICC projects over the left perihilar region, which in correlation with recent CTA is at the level of a persistent left-sided SVC. Stable mild cardiomegaly post median sternotomy and CABG. The diffuse bilateral pulmonary opacities demonstrate further partial clearing. There is no confluent airspace opacity, pneumothorax or significant pleural effusion. The bones appear unchanged. IMPRESSION: 1. Tip of the left arm PICC projects over the mid aspect of a persistent left-sided SVC seen on prior CT. 2. Improving  bilateral airspace opacities consistent with improving COVID-19 infection. Electronically Signed   By: Richardean Sale M.D.   On: 08/12/2021 14:07   [place] EKG SITE RITE  Result Date: 08/11/2021 If Site Rite image not attached, placement could not be confirmed due to current cardiac rhythm.       Scheduled Meds:  (feeding supplement) PROSource Plus  30 mL Oral BID BM   arformoterol  15 mcg Nebulization BID   vitamin C  500 mg Oral Daily   aspirin  81 mg Oral Daily   budesonide  2 mL Nebulization BID   calcitRIOL  0.25 mcg Oral Q3 days   Chlorhexidine Gluconate Cloth  6 each Topical Daily   clopidogrel  75 mg Oral Daily   feeding supplement (GLUCERNA SHAKE)  237 mL Oral TID BM   fluticasone  2 spray Each Nare Daily   furosemide  40 mg Intravenous BID   guaiFENesin-dextromethorphan  10 mL Oral QID   insulin aspart  0-20 Units  Subcutaneous TID WC   insulin aspart  0-5 Units Subcutaneous QHS   insulin aspart  8 Units Subcutaneous TID WC   insulin glargine-yfgn  25 Units Subcutaneous Daily   isosorbide mononitrate  90 mg Oral Daily   lamoTRIgine  25 mg Oral QHS   linagliptin  5 mg Oral Daily   loratadine  10 mg Oral Daily   magic mouthwash  5 mL Oral QID   mouth rinse  15 mL Mouth Rinse BID   methylPREDNISolone (SOLU-MEDROL) injection  50 mg Intravenous Q12H   Followed by   Derrill Memo ON 08/15/2021] predniSONE  40 mg Oral Q breakfast   Followed by   Derrill Memo ON 08/17/2021] predniSONE  30 mg Oral Q breakfast   Followed by   Derrill Memo ON 08/19/2021] predniSONE  10 mg Oral Q breakfast   Followed by   Derrill Memo ON 08/21/2021] predniSONE  10 mg Oral Q breakfast   metoprolol tartrate  25 mg Oral BID   montelukast  10 mg Oral QHS   multivitamin with minerals  1 tablet Oral Daily   pantoprazole  40 mg Oral Daily   Ensure Max Protein  11 oz Oral Daily   rosuvastatin  20 mg Oral Daily   senna-docusate  2 tablet Oral BID   sertraline  100 mg Oral Daily   sodium chloride flush  10-40 mL Intracatheter  Q12H   zinc sulfate  220 mg Oral Daily   Continuous Infusions:  doxycycline (VIBRAMYCIN) IV Stopped (08/13/21 1556)   heparin 700 Units/hr (08/13/21 1603)     LOS: 9 days    Time spent: over 30 min 40 min critical care time with AHRF 2/2 covid and mrsa pneumonia on HHFNC  Kayleen Memos, MD [facility]   To contact the attending provider between 7A-7P or the covering provider during after hours 7P-7A, please log into the web site [email or web address] and access using universal [place] password for that web site. If you do not have the password, please call the hospital operator.  08/13/2021, 4:18 PM

## 2021-08-13 NOTE — Progress Notes (Signed)
Inpatient Diabetes Program Recommendations  AACE/ADA: New Consensus Statement on Inpatient Glycemic Control (2015)  Target Ranges:  Prepandial:   less than 140 mg/dL      Peak postprandial:   less than 180 mg/dL (1-2 hours)      Critically ill patients:  140 - 180 mg/dL   Lab Results  Component Value Date   GLUCAP 327 (H) 08/13/2021   HGBA1C 7.9 (H) 08/11/2021    Review of Glycemic Control  Diabetes history: DM2 Outpatient Diabetes medications: Basaglar 12 units q AM, Novolog 4-10 units tid with meals, Amaryl 4 mg daily, Metformin 500 mg bid Current orders for Inpatient glycemic control: Tradjenta 5 mg QD, Novolog 0-20 units TID with meals and 0-5 HS + 8 units TID, Semglee 25 units QD  Inpatient Diabetes Program Recommendations:    Increase Novolog meal coverage to 10 units TID with meals if eating > 50%. Decrease when steroids are decreased on 12/30.  Continue to follow.  Thank you. Lorenda Peck, RD, LDN, CDE Inpatient Diabetes Coordinator [phone]

## 2021-08-13 NOTE — Evaluation (Signed)
Occupational Therapy Evaluation Patient Details Name: Katherine [name] MRN: [id number] DOB: February 13, 1956 Today's Date: 08/13/2021   History of Present Illness Pt admitted from home 2* SOB with COVID PNA and with hx of CAD, CABG, PAD, PAF, COPD, Fibromyalgia, R eye CVA with residual blindness, kidney transplant and obesity   Clinical Impression   Katherine [name] is a 65 year old woman who presents with generalized weakness, decreased activity tolerance and balance and impaired cardiopulmonary status currently on 35 L HHFNC. Patient min assist to transfer to side of bed and min x 2 with hands holds to stand and transfer to recliner. Patient max assist for LB ADLs and set up in to min assist for UB ADLs. She has poor activity tolerance. Vitals stable during evaluation. Patient will benefit from skilled OT services while in hospital to improve deficits and learn compensatory strategies as needed in order to return to PLOF.  Patient fairly adamant about not going to a facility at discharge and reports having 24/7 assist at home. Will tentatively recommend [facility] services as long as patient continues to make progress and oxygen requirements lesson.       Recommendations for follow up therapy are one component of a multi-disciplinary discharge planning process, led by the attending physician.  Recommendations may be updated based on patient status, additional functional criteria and insurance authorization.   Follow Up Recommendations  Home health OT    Assistance Recommended at Discharge Frequent or constant Supervision/Assistance  Functional Status Assessment  Patient has had a recent decline in their functional status and demonstrates the ability to make significant improvements in function in a reasonable and predictable amount of time.  Equipment Recommendations  Other (comment) (TBD)    Recommendations for Other Services PT consult     Precautions / Restrictions Precautions Precautions:  Fall Precaution Comments: monitor sats Restrictions Weight Bearing Restrictions: No      Mobility Bed Mobility Overal bed mobility: Needs Assistance Bed Mobility: Supine to Sit     Supine to sit: Min assist;HOB elevated     General bed mobility comments: Min assist to transfer to side of bed with use of bed rails and hand hold.    Transfers Overall transfer level: Needs assistance Equipment used: 2 person hand held assist Transfers: Sit to/from Stand;Bed to chair/wheelchair/BSC Sit to Stand: Min assist;+2 safety/equipment;From elevated surface     Step pivot transfers: Min assist;+2 safety/equipment            Balance Overall balance assessment: Needs assistance Sitting-balance support: No upper extremity supported Sitting balance-Leahy Scale: Fair     Standing balance support: Bilateral upper extremity supported Standing balance-Leahy Scale: Poor                             ADL either performed or assessed with clinical judgement   ADL Overall ADL's : Needs assistance/impaired Eating/Feeding: Independent   Grooming: Set up;Sitting   Upper Body Bathing: Set up;Sitting   Lower Body Bathing: Maximal assistance;Sitting/lateral leans   Upper Body Dressing : Sitting;Minimal assistance   Lower Body Dressing: Maximal assistance;+2 for safety/equipment;Sit to/from stand   Toilet Transfer: Minimal assistance;+2 for safety/equipment;BSC/3in1;Stand-pivot   Toileting- Clothing Manipulation and Hygiene: Maximal assistance;Sitting/lateral lean       Functional mobility during ADLs: Minimal assistance;+2 for safety/equipment General ADL Comments: Patient required +2 to hand holds to stand and take steps to recliner. Slightly woblly in standing.     Vision Baseline Vision/History:  1 Wears glasses Patient Visual Report: No change from baseline       Perception     Praxis      Pertinent Vitals/Pain Pain Assessment: No/denies pain     Hand  Dominance Right   Extremity/Trunk Assessment Upper Extremity Assessment Upper Extremity Assessment: Overall WFL for tasks assessed   Lower Extremity Assessment Lower Extremity Assessment: Defer to PT evaluation LLE Deficits / Details: Limited flexion L knee - pt states this has been long term   Cervical / Trunk Assessment Cervical / Trunk Assessment: Normal   Communication Communication Communication: No difficulties   Cognition Arousal/Alertness: Awake/alert Behavior During Therapy: WFL for tasks assessed/performed Overall Cognitive Status: Within Functional Limits for tasks assessed                                       General Comments       Exercises     Shoulder Instructions      Home Living Family/patient expects to be discharged to:: Private residence Living Arrangements: Other relatives Available Help at Discharge: Family;Available 24 hours/day Type of Home: House Home Access: Stairs to enter [employer] of Steps: 5 Entrance Stairs-Rails: Left;Right Home Layout: One level     Bathroom Shower/Tub: Teacher, early years/pre: Standard     Home Equipment: Rollator (4 wheels);Cane - single point;Tub bench;Grab bars - toilet;Wheelchair - manual   Additional Comments: wears 3L of supplemental O2 at home      Prior Functioning/Environment Prior Level of Function : Independent/Modified Independent                        OT Problem List: Decreased strength;Decreased activity tolerance;Impaired balance (sitting and/or standing);Cardiopulmonary status limiting activity;Obesity;Decreased knowledge of use of DME or AE      OT Treatment/Interventions: Self-care/ADL training;Therapeutic exercise;DME and/or AE instruction;Therapeutic activities;Balance training;Patient/family education    OT Goals(Current goals can be found in the care plan section) Acute Rehab OT Goals Patient Stated Goal: return to independence at  home OT Goal Formulation: With patient Time For Goal Achievement: 08/27/21 Potential to Achieve Goals: Fair  OT Frequency: Min 2X/week   Barriers to D/C:            Co-evaluation              AM-PAC OT "6 Clicks" Daily Activity     Outcome Measure Help from another person eating meals?: None Help from another person taking care of personal grooming?: A Little Help from another person toileting, which includes using toliet, bedpan, or urinal?: A Lot Help from another person bathing (including washing, rinsing, drying)?: A Lot Help from another person to put on and taking off regular upper body clothing?: A Little Help from another person to put on and taking off regular lower body clothing?: A Lot 6 Click Score: 16   End of Session Equipment Utilized During Treatment: Oxygen Nurse Communication: Mobility status  Activity Tolerance: Patient limited by fatigue Patient left: in chair;with call bell/phone within reach;with chair alarm set  OT Visit Diagnosis: Muscle weakness (generalized) (M62.81)                Time: 8119-1478 OT Time Calculation (min): 20 min Charges:  OT General Charges $OT Visit: 1 Visit OT Evaluation $OT Eval Moderate Complexity: 1 Mod  Demarquis Osley, OTR/L [place]  Office [phone] Pager: [phone]  Dimonique Bourdeau L Alisandra Son 08/13/2021, 12:16 PM

## 2021-08-13 NOTE — Progress Notes (Addendum)
ANTICOAGULATION CONSULT NOTE  Pharmacy Consult for Heparin Indication: chest pain/ACS  Allergies  Allergen Reactions   Tetracycline Hives    Patient tolerated Doxycycline Dec 2020   Niacin Other (See Comments)    Mouth blisters   Niaspan [Niacin Er] Other (See Comments)    Mouth blisters   Sulfa Antibiotics Nausea Only and Other (See Comments)    "Tears up stomach"   Sulfonamide Derivatives Other (See Comments)    Reaction: per patient "tears her stomach up"   Codeine Nausea And Vomiting   Erythromycin Nausea And Vomiting   Hydromorphone Hcl Nausea And Vomiting   Morphine And Related Nausea And Vomiting   Nalbuphine Nausea And Vomiting    Nubain   Sulfasalazine Nausea Only and Other (See Comments)    per patient "tears her stomach up", "Tears up stomach"   Tape Rash and Other (See Comments)    No "plastic" tape," please----cloth tape only    Patient Measurements: Height: 5\' 1"  (154.9 cm) Weight: 86.5 kg (190 lb 11.2 oz) IBW/kg (Calculated) : 47.8 Heparin Dosing Weight: 66kg  Vital Signs: Temp: 98.9 F (37.2 C) (12/27 2302) Temp Source: Oral (12/27 2302) BP: 153/90 (12/28 0304) Pulse Rate: 72 (12/28 0304)  Labs: Recent Labs    08/11/21 0309 08/11/21 0539 08/11/21 1040 08/11/21 1431 08/11/21 2231 08/12/21 0311 08/12/21 0312 08/13/21 0410  HGB 11.6*  --   --   --   --   --  11.1*  --   HCT 35.0*  --   --   --   --   --  33.1*  --   PLT 332  --   --   --   --   --  318  --   APTT 105*  --   --  115* 73*  --  70* 46*  HEPARINUNFRC >1.10*  --   --   --   --   --  0.77* 0.59  CREATININE 0.87  --   --   --   --   --  0.93 0.89  TROPONINIHS  --    < > 1,171* 877*  --  926*  --   --    < > = values in this interval not displayed.      Medications:  Infusions:   doxycycline (VIBRAMYCIN) IV Stopped (08/13/21 0420)   heparin 700 Units/hr (08/13/21 0517)    Assessment: 37 yoF, Covid +, requiring high O2 administration. Complaint of chest pain, Troponin  elevated.  PTA Apixaban for Afib transitioned to heparin infusion in the setting of NSTEMI.   - Last dose Apixaban 12/24 at 0900.  Note that Apixaban will artificially elevate Hep levels, will dose based on aPTT until both levels correlate Troponin: 16, 150, 928, 2835, peak at 4125 >> now downtrending  Heparin level falsely elevated due to recent apixaban.  Today, 08/13/21 - Heparin level therapeutic at 0.59 - Heparin level and aPTT are correlating, so will dose heparin drip off of heparin levels moving forward - 12/27: CBC remains stable - Originally planned for cardiac cath either 12/29 or 12/30; however this is dependent on if O2 requirements improve. May be pushed to next week. Will need to remain on heparin per cardiology  Goal of Therapy:  Heparin level 0.3-0.7 units/ml Monitor platelets by anticoagulation protocol: Yes   Plan:  Continue heparin drip at 700 units/hour Discontinue daily aPTT checks Daily heparin level and CBC Continue to monitor H&H and platelets F/u cardiac cath plans and if/when  heparin should be held F/u ability to transition back to [employer], PharmD 08/13/2021 6:55 AM

## 2021-08-14 ENCOUNTER — Inpatient Hospital Stay (HOSPITAL_COMMUNITY): Payer: Medicare Other

## 2021-08-14 DIAGNOSIS — J9621 Acute and chronic respiratory failure with hypoxia: Secondary | ICD-10-CM | POA: Diagnosis not present

## 2021-08-14 DIAGNOSIS — U071 COVID-19: Secondary | ICD-10-CM | POA: Diagnosis not present

## 2021-08-14 DIAGNOSIS — I214 Non-ST elevation (NSTEMI) myocardial infarction: Secondary | ICD-10-CM | POA: Diagnosis not present

## 2021-08-14 DIAGNOSIS — J1282 Pneumonia due to coronavirus disease 2019: Secondary | ICD-10-CM | POA: Diagnosis not present

## 2021-08-14 DIAGNOSIS — I5043 Acute on chronic combined systolic (congestive) and diastolic (congestive) heart failure: Secondary | ICD-10-CM | POA: Diagnosis not present

## 2021-08-14 LAB — CBC
HCT: 36.8 % (ref 36.0–46.0)
Hemoglobin: 12.5 g/dL (ref 12.0–15.0)
MCH: 32.1 pg (ref 26.0–34.0)
MCHC: 34 g/dL (ref 30.0–36.0)
MCV: 94.6 fL (ref 80.0–100.0)
Platelets: 311 10*3/uL (ref 150–400)
RBC: 3.89 MIL/uL (ref 3.87–5.11)
RDW: 15 % (ref 11.5–15.5)
WBC: 9.5 10*3/uL (ref 4.0–10.5)
nRBC: 0.2 % (ref 0.0–0.2)

## 2021-08-14 LAB — GLUCOSE, CAPILLARY
Glucose-Capillary: 193 mg/dL — ABNORMAL HIGH (ref 70–99)
Glucose-Capillary: 311 mg/dL — ABNORMAL HIGH (ref 70–99)
Glucose-Capillary: 305 mg/dL — ABNORMAL HIGH (ref 70–99)
Glucose-Capillary: 71 mg/dL (ref 70–99)

## 2021-08-14 LAB — TROPONIN I (HIGH SENSITIVITY): Troponin I (High Sensitivity): 351 ng/L (ref <18)

## 2021-08-14 LAB — PROCALCITONIN: Procalcitonin: <0.1 ng/mL

## 2021-08-14 LAB — HEPARIN LEVEL (UNFRACTIONATED): Heparin Unfractionated: 0.46 IU/mL (ref 0.30–0.70)

## 2021-08-14 MED ORDER — ASPIRIN EC 81 MG PO TBEC
81.0000 mg | DELAYED_RELEASE_TABLET | Freq: Once | ORAL | Status: AC
  Administered 2021-08-14: 19:00:00 81 mg via ORAL
  Filled 2021-08-14: qty 1

## 2021-08-14 NOTE — Progress Notes (Signed)
ANTICOAGULATION CONSULT NOTE  Pharmacy Consult for Heparin Indication: chest pain/ACS  Allergies  Allergen Reactions   Tetracycline Hives    Patient tolerated Doxycycline Dec 2020   Niacin Other (See Comments)    Mouth blisters   Niaspan [Niacin Er] Other (See Comments)    Mouth blisters   Sulfa Antibiotics Nausea Only and Other (See Comments)    "Tears up stomach"   Sulfonamide Derivatives Other (See Comments)    Reaction: per patient "tears her stomach up"   Codeine Nausea And Vomiting   Erythromycin Nausea And Vomiting   Hydromorphone Hcl Nausea And Vomiting   Morphine And Related Nausea And Vomiting   Nalbuphine Nausea And Vomiting    Nubain   Sulfasalazine Nausea Only and Other (See Comments)    per patient "tears her stomach up", "Tears up stomach"   Tape Rash and Other (See Comments)    No "plastic" tape," please----cloth tape only    Patient Measurements: Height: 5\' 1"  (154.9 cm) Weight: 86.5 kg (190 lb 11.2 oz) IBW/kg (Calculated) : 47.8 Heparin Dosing Weight: 66kg  Vital Signs: Temp: 98.9 F (37.2 C) (12/29 0000) Temp Source: Axillary (12/29 0000) BP: 110/71 (12/29 0324) Pulse Rate: 69 (12/29 0324)  Labs: Recent Labs    08/11/21 1040 08/11/21 1431 08/11/21 1431 08/11/21 2231 08/12/21 0311 08/12/21 0312 08/13/21 0410 08/14/21 0409 08/14/21 0410  HGB  --   --   --   --   --  11.1*  --   --  12.5  HCT  --   --   --   --   --  33.1*  --   --  36.8  PLT  --   --   --   --   --  318  --   --  311  APTT  --  115*   < > 73*  --  70* 46*  --   --   HEPARINUNFRC  --   --   --   --   --  0.77* 0.59 0.46  --   CREATININE  --   --   --   --   --  0.93 0.89  --   --   TROPONINIHS 1,171* 877*  --   --  926*  --   --   --   --    < > = values in this interval not displayed.      Medications:  Infusions:   doxycycline (VIBRAMYCIN) IV 100 mg (08/14/21 0408)   heparin 700 Units/hr (08/14/21 0400)    Assessment: 65 yoF, Covid +, requiring high O2  administration. Complaint of chest pain, Troponin elevated.  PTA Apixaban for Afib transitioned to heparin infusion in the setting of NSTEMI.   - Last dose Apixaban 12/24 at 0900.  Note that Apixaban will artificially elevate Hep levels, will dose based on aPTT until both levels correlate Troponin: 16, 150, 928, 2835, peak at 4125 >> now downtrending  Heparin level falsely elevated due to recent apixaban.  Today, 08/14/21 - Heparin level therapeutic at 0.46 - CBC WNL - no bleeding noted - Originally planned for cardiac cath either 12/29 or 12/30; however this is dependent on if O2 requirements improve. May be pushed to next week. Will need to remain on heparin per cardiology  Goal of Therapy:  Heparin level 0.3-0.7 units/ml Monitor platelets by anticoagulation protocol: Yes   Plan:  Continue heparin drip at 700 units/hour Daily heparin level and CBC Continue to monitor H&H  and platelets F/u cardiac cath plans and if/when heparin should be held F/u ability to transition back to [place] 08/14/2021, 5:00 AM

## 2021-08-14 NOTE — Progress Notes (Signed)
PROGRESS NOTE    Katherine [name]  DSK:[id number] DOB: [birth year] DOA: 08/04/2021 PCP: [name], MD  Chief Complaint  Patient presents with   generalized weakness    Brief Narrative:  This is a 65 year old female with anxiety, depression, CAD/CABG, chronic hypoxic respiratory failure related to COPD on 3 L of oxygen at home, type 2 diabetes mellitus, end-stage renal disease status post renal transplant, GERD, obesity, hypertension paroxysmal atrial fibrillation, PAD who presented to the hospital on 12/19 with shortness of breath and generalized weakness.  She was found to have a pulse ox of 84% on her usual oxygen, noted to be wheezing and had a cough with yellow sputum.  COVID PCR noted to be positive.  She's been admitted for COVID 19 pneumonia and now found to have MRSA pneumonia.  She's had progressively worsening O2 needs, now on heated high flow.    See below for additional details   08/14/2021: Patient seen and examined at bedside.  She states she feels better this morning.  Still weak but willing to work with PT.   Assessment & Plan:   Principal Problem:   Acute on chronic respiratory failure with hypoxia (HCC) Active Problems:   Hypomagnesemia   Dyslipidemia, goal LDL below 70   NSTEMI (non-ST elevated myocardial infarction) (HCC)   CAD S/P percutaneous coronary angioplasty - PCI x 5 to SVG-D1   Essential hypertension   PAD (peripheral artery disease) (HCC)   Paroxysmal atrial fibrillation (HCC); CHA2DS2VASc score F, HTN, CAD, CVA = 5   OSA (obstructive sleep apnea)   GAD (generalized anxiety disorder)   Acute CHF (congestive heart failure) ([place])   History of renal transplant   Uncontrolled type 2 diabetes mellitus with hyperglycemia, 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  Possible contribution from HF. Uses 2 LPM at home COVID test + on 12/18 CXR 12/24 with worsening severe diffuse bilateral airspace disease CT PE protocol chest 08/05/2021: limited for PE, but negative, extensive heterogenous and GGO throughout lungs  Satting in 90's, on 25 L HHFNC (slowly improving) Elevated BNP -> lasix as tolerated Completed remdesivir.  Continue IV steroids for covid and abx to cover for MRSA pneumonia She's immunosuppressed on antirejection meds with renal transplant (not candidate for baricitinib or actemra with MRSA infection and immunosuppression).  Poor prognosis with immunosuppression and covid.  COVID-19 Labs  Recent Labs    08/10/21 0515 08/11/21 0309  DDIMER 2.01* 1.43*  CRP  --  2.4*    COVID-19 Labs  Recent Labs    08/10/21 0515 08/11/21 0309  DDIMER 2.01* 1.43*  CRP  --  2.4*    Lab Results  Component Value Date   SARSCOV2NAA POSITIVE (A) 08/04/2021   SARSCOV2NAA NEGATIVE 05/12/2021   [place] NEGATIVE 04/19/2021   [place] NEGATIVE 03/30/2021     Positive D dimer On anticoagulation Follow LE [place] - negative  MRSA pneumonia ([place]) 12/20 sputum cx with MRSA (and candida albicans - suspect this is contaminant/colonizer)  On doxycycline, plan for 7 days (12/23 - present) As above  Chronic diastolic CHF (congestive heart failure) ([place])- (present on admission) Grade 1 diastolic dysfunction  Obesity (BMI 30-39.9)- (present on admission) Body mass index is 37.03 kg/m.  Placing the patient at high risk of poor outcome.  Monitor.  COVID-19 virus infection- (present on admission) As above Steroids, remdesivir (s/p 5 doses) Tapering steroids  Pressure injury of skin- (present on admission) Stage II, medial rectal ulcer. Present on admission. 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 Receives prednisone and Imuran. Placing the patient at high risk for poor outcome secondary to chronic immunosuppressive status  Acute CHF (congestive heart failure) (HCC) Elevated BNP Follow echo - EF 45-50%, RWMA (see report) Lasix as tolerated, follow renal function Strict I/O, daily weights - net negative  GAD (generalized anxiety disorder)- (present on admission) Continue Zoloft and Ativan.  OSA (obstructive sleep apnea)- (present on admission) Does not use CPAP at night after lap band.  Paroxysmal atrial fibrillation (HCC); CHA2DS2VASc score F, HTN, CAD, CVA = 5- (present on admission) Continue Metoprolol (dose reduced due to brady) and Eliquis -> now on heparin gtt  PAD (peripheral artery disease) ([place])- (present on admission) Prior history.  Monitor.  Essential hypertension Recently improved, follow Continue home BP meds, consider adding meds pending trend  CAD S/P percutaneous coronary angioplasty - PCI x 5 to SVG-D1 Prior history.  Monitor. W/u for chest pain above  NSTEMI (non-ST elevated myocardial infarction) ([place]) Episode of CP similar to prior heart attacks on 12/24 Continue nitro prn  Was already anticoagulated on eliquis, antiplatelet on plavix -> given concerning symptoms/rising troponin -> transitioned to heparin gtt, aspirin added to placix EF 45-50%, RWMA's (see report) Cardiology c/s, appreciate assistance -> plan for aspirin, plavix, imdur (increased dose), lopressor, crestor - consider arb/arni if BP remains high, consider cath pending clinical status (note 12/25) Would want her O2 needs to improve prior to diagnostic cath - still has  significant O2 needs Ok to work with PT/OT if asymptomatic  Dyslipidemia, goal LDL below 70- (present on admission) Continue statin.  Hypomagnesemia- (present on admission) Replaced.  Worsening hyponatremia, encourage increase oral intake Hypomagnesemia has resolved. Hyperglycemia is improving  Hypercarbia is improving  Generalized weakness PT OT to assess Fall precautions  MRSA pneumonia Personally reviewed last chest x-ray showing improvement of infiltrates. Repeated procalcitonin level less than 0.10 today.   DVT prophylaxis: heparin gtt (previously eliquis) Code Status: full Family Communication: none at bedside - discussed with niece 12/24, no answer 12/25 Disposition:   Status is: Inpatient  Remains inpatient appropriate because: sig O2 requirement, NSTEMI       Consultants:  cardiology  Procedures:  echo IMPRESSIONS     1. Left ventricular ejection fraction, by estimation, is 45 to 50%. The  left ventricle has mildly decreased function. The left ventricle  demonstrates regional wall motion abnormalities (see scoring  diagram/findings for description). There is mild left  ventricular hypertrophy. Left ventricular diastolic function could not be  evaluated.   2. Right ventricular systolic function is normal. The right ventricular  size is normal.   3. The mitral valve is grossly normal. Trivial mitral valve  regurgitation.   4. The aortic valve has an indeterminant number of cusps. There is  moderate calcification of the aortic valve. Aortic valve regurgitation is  not visualized. Aortic valve sclerosis/calcification is present, without  any evidence of aortic  stenosis.   Comparison(s): Prior images reviewed side by side. Wall motion more  similar to last study with Definity contrast on 07/12/2020, although LVEF  somewhat better.   Summary:  BILATERAL:  -No evidence of popliteal cyst, bilaterally.      Antimicrobials:  Anti-infectives (From  admission, onward)    Start     Dose/Rate Route Frequency Ordered Stop   08/08/21 1400  doxycycline (VIBRAMYCIN) 100 mg in sodium chloride 0.9 % 250 mL IVPB        100 mg 125 mL/hr over 120 Minutes Intravenous Every 12 hours 08/08/21 1327 08/15/21 1414   08/06/21 1000  remdesivir 100 mg in sodium chloride 0.9 % 100 mL IVPB  Status:  Discontinued       See Hyperspace for full Linked Orders Report.   100 mg 200 mL/hr over 30 Minutes Intravenous Daily 08/05/21 0720 08/05/21 0720   08/06/21 1000  remdesivir 100 mg in sodium chloride 0.9 % 100 mL IVPB        100 mg 200 mL/hr over 30 Minutes Intravenous Daily 08/05/21 0721 08/09/21 0937   08/05/21 1000  remdesivir 100 mg in sodium chloride 0.9 % 100 mL IVPB  Status:  Discontinued       See Hyperspace for full Linked Orders Report.   100 mg 200 mL/hr over 30 Minutes Intravenous Daily 08/04/21 0819 08/05/21 0720   08/05/21 1000  remdesivir 200 mg in sodium chloride 0.9% 250 mL IVPB        200 mg 580 mL/hr over 30 Minutes Intravenous Once 08/05/21 0719 08/05/21 1150   08/05/21 1000  cefTRIAXone (ROCEPHIN) 2 g in sodium chloride 0.9 % 100 mL IVPB        2 g 200 mL/hr over 30 Minutes Intravenous Every 24 hours 08/05/21 0815 08/09/21 1032   08/05/21 1000  azithromycin (ZITHROMAX) 500 mg in sodium chloride 0.9 % 250 mL IVPB  Status:  Discontinued        500 mg 250 mL/hr over 60 Minutes Intravenous Every 24 hours 08/05/21 0815 08/05/21 0907   08/04/21 0930  remdesivir 200 mg in sodium chloride 0.9% 250 mL IVPB  Status:  Discontinued       See Hyperspace for full Linked Orders Report.   200 mg 580 mL/hr over 30 Minutes Intravenous Once 08/04/21 0819 08/05/21 0720        Objective: Vitals:   08/14/21 1300 08/14/21 1302 08/14/21 1400 08/14/21 1500  BP:  (!) 104/59 (!) 110/57 106/79  Pulse: 76 77 73 81  Resp: 15 17 19 20   Temp:      TempSrc:      SpO2: 90% 97% (!) 89% 90%  Weight:      Height:        Intake/Output Summary (Last 24  hours) at 08/14/2021 1659 Last data filed at 08/14/2021 1500 Gross per 24 hour  Intake 462.71 ml  Output 2650 ml  Net -2187.29 ml   Filed Weights   08/12/21 0700 08/13/21 0517 08/14/21 0500  Weight: 88.1 kg 86.5 kg 88 kg    Examination:  General: Well-developed well-nourished in no acute distress.  She is alert and oriented x3.   Cardiovascular: Regular rate and rhythm no rubs or gallops.   Lungs: Mild rales at bases no wheezing noted.  Poor inspiratory effort.   Abdomen: Soft nontender normal bowel sounds present. Neurological: Nonfocal exam, moves all 4 extremities.  Skin: No rashes or ulcerative lesions noted.   Extremities: No lower extremity edema bilaterally.  Data Reviewed: I have personally reviewed following labs and imaging studies  CBC: Recent Labs  Lab 08/08/21 0256 08/09/21 0253 08/10/21 0515 08/11/21 0309 08/12/21 0312 08/14/21 0410  WBC 11.1* 10.1 9.2 8.4 8.0 9.5  NEUTROABS 10.4* 9.3* 8.4* 7.9* 7.3  --   HGB 10.7* 11.2* 10.6* 11.6* 11.1* 12.5  HCT 32.1* 34.7* 32.0* 35.0* 33.1* 36.8  MCV 95.8 98.0 95.5 96.2 93.5 94.6  PLT 282 275 315 332 318 557    Basic Metabolic Panel: Recent Labs  Lab 08/09/21 0253 08/10/21 0515 08/11/21 0309 08/12/21 0312 08/13/21 0410  NA 137 138 137 133* 131*  K 4.1 3.4* 4.3 4.1 3.6  CL 102 99 98 96* 92*  CO2 27 31 30 28 29   GLUCOSE 204* 119* 211* 249* 189*  BUN 34* 38* 41* 44* 47*  CREATININE 0.78 0.77 0.87 0.93 0.89  CALCIUM 8.7* 8.4* 8.7* 8.8* 9.1  MG 2.2 2.1 2.2 1.9 1.9  PHOS  --  3.7 3.1 3.1 3.5    GFR: Estimated Creatinine Clearance: 64.4 mL/min (by C-G formula based on SCr of 0.89 mg/dL).  Liver Function Tests: Recent Labs  Lab 08/09/21 0253 08/10/21 0515 08/11/21 0309 08/12/21 0312 08/13/21 0410  AST 15 27 23 18 16   ALT 10 11 13 14 15   ALKPHOS 117 99 115 129* 138*  BILITOT 0.6 0.5 0.6 0.8 0.7  PROT 6.4* 5.9* 6.4* 6.1* 6.1*  ALBUMIN 2.6* 2.3* 2.6* 2.4* 2.4*    CBG: Recent Labs  Lab  08/13/21 1723 08/13/21 2134 08/14/21 0816 08/14/21 1120 08/14/21 1628  GLUCAP 223* 193* 193* 311* 305*     Recent Results (from the past 240 hour(s))  Culture, blood (routine x 2) Call MD if unable to obtain prior to antibiotics being given     Status: None   Collection Time: 08/05/21 10:12 AM   Specimen: BLOOD LEFT HAND  Result Value Ref Range Status   Specimen Description   Final    BLOOD LEFT HAND Performed at [facility], [place] [address] [place], [place] [address]    Special Requests   Final    BOTTLES DRAWN AEROBIC ONLY Blood Culture results may not be optimal due to an inadequate volume of blood received in culture bottles Performed at [place] [address] [place], [place] [address]    Culture   Final    NO GROWTH 5 DAYS Performed at [facility], [place] [address] [place], [place] [address]    Report Status 08/10/2021 FINAL  Final  Expectorated Sputum Assessment w Gram Stain, Rflx to Resp Cult     Status: None   Collection Time: 08/05/21 10:31 AM   Specimen: Expectorated Sputum  Result Value Ref Range Status   Specimen Description EXPECTORATED SPUTUM  Final   Special Requests NONE  Final   Sputum evaluation   Final    Sputum specimen not acceptable for testing.  Please recollect.   RESULT CALLED TO, READ BACK BY AND VERIFIED WITH: NOTIFY Joesphine Bare RN ON 07/2021 @ 1104 BY MECIAL J. Performed at [facility], [place] [address], [place], [place] [address]    Report Status 08/05/2021 FINAL  Final  Culture, blood (routine x 2) Call MD if unable to obtain prior to antibiotics being given     Status: None   Collection Time: 08/05/21 11:07 AM   Specimen: BLOOD RIGHT HAND  Result Value Ref Range Status   Specimen Description   Final  BLOOD RIGHT HAND Performed at [facility], [place] [address], [place], [place] [address]    Special Requests   Final    BOTTLES DRAWN AEROBIC ONLY Blood  Culture results may not be optimal due to an inadequate volume of blood received in culture bottles Performed at [place] [address] [place], [place] [address]    Culture   Final    NO GROWTH 5 DAYS Performed at [facility], [place] [address], [place], [place] [address]    Report Status 08/10/2021 FINAL  Final  Expectorated Sputum Assessment w Gram Stain, Rflx to Resp Cult     Status: None   Collection Time: 08/05/21  1:29 PM   Specimen: SPU  Result Value Ref Range Status   Specimen Description SPUTUM  Final   Special Requests NONE  Final   Sputum evaluation   Final    THIS SPECIMEN IS ACCEPTABLE FOR SPUTUM CULTURE Performed at [facility], [place] [address] [place], [place] [address]    Report Status 08/05/2021 FINAL  Final  Culture, Respiratory w Gram Stain     Status: None   Collection Time: 08/05/21  1:29 PM   Specimen: SPU  Result Value Ref Range Status   Specimen Description   Final    SPUTUM Performed at [place] [address], [place], [place] [address]    Special Requests   Final    NONE Reflexed from [id number] Performed at [facility], [place] [address], [place], [place] [address]    Gram Stain   Final    ABUNDANT WBC PRESENT, PREDOMINANTLY MONONUCLEAR MODERATE YEAST FEW GRAM POSITIVE RODS FEW GRAM POSITIVE COCCI Performed at [facility], [place] [address], [place], [place] [address]    Culture   Final    MODERATE METHICILLIN RESISTANT STAPHYLOCOCCUS AUREUS FEW CANDIDA ALBICANS    Report Status 08/08/2021 FINAL  Final   Organism ID, Bacteria METHICILLIN RESISTANT STAPHYLOCOCCUS AUREUS  Final      Susceptibility   Methicillin resistant staphylococcus aureus - MIC*    CIPROFLOXACIN >=8 RESISTANT Resistant     ERYTHROMYCIN >=8 RESISTANT Resistant     GENTAMICIN <=0.5 SENSITIVE Sensitive     OXACILLIN >=4 RESISTANT Resistant     TETRACYCLINE <=1 SENSITIVE Sensitive      VANCOMYCIN <=0.5 SENSITIVE Sensitive     TRIMETH/SULFA <=10 SENSITIVE Sensitive     CLINDAMYCIN >=8 RESISTANT Resistant     RIFAMPIN <=0.5 SENSITIVE Sensitive     Inducible Clindamycin NEGATIVE Sensitive     * MODERATE METHICILLIN RESISTANT STAPHYLOCOCCUS AUREUS         Radiology Studies: No results found.      Scheduled Meds:  (feeding supplement) PROSource Plus  30 mL Oral BID BM   arformoterol  15 mcg Nebulization BID   vitamin C  500 mg Oral Daily   aspirin  81 mg Oral Daily   budesonide  2 mL Nebulization BID   calcitRIOL  0.25 mcg Oral Q3 days   Chlorhexidine Gluconate Cloth  6 each Topical Daily   clopidogrel  75 mg Oral Daily   feeding supplement (GLUCERNA SHAKE)  237 mL Oral TID BM   fluticasone  2 spray Each Nare Daily   furosemide  40 mg Intravenous BID   guaiFENesin-dextromethorphan  10 mL Oral QID   insulin aspart  0-20 Units Subcutaneous TID WC   insulin aspart  0-5 Units Subcutaneous QHS   insulin  aspart  8 Units Subcutaneous TID WC   insulin glargine-yfgn  25 Units Subcutaneous Daily   isosorbide mononitrate  90 mg Oral Daily   lamoTRIgine  25 mg Oral QHS   linagliptin  5 mg Oral Daily   loratadine  10 mg Oral Daily   magic mouthwash  5 mL Oral QID   mouth rinse  15 mL Mouth Rinse BID   metoprolol tartrate  25 mg Oral BID   montelukast  10 mg Oral QHS   multivitamin with minerals  1 tablet Oral Daily   pantoprazole  40 mg Oral Daily   [START ON 08/15/2021] predniSONE  40 mg Oral Q breakfast   Followed by   Derrill Memo ON 08/17/2021] predniSONE  30 mg Oral Q breakfast   Followed by   Derrill Memo ON 08/19/2021] predniSONE  10 mg Oral Q breakfast   Followed by   Derrill Memo ON 08/21/2021] predniSONE  10 mg Oral Q breakfast   Ensure Max Protein  11 oz Oral Daily   rosuvastatin  20 mg Oral Daily   senna-docusate  2 tablet Oral BID   sertraline  100 mg Oral Daily   sodium chloride flush  10-40 mL Intracatheter Q12H   zinc sulfate  220 mg Oral Daily   Continuous  Infusions:  doxycycline (VIBRAMYCIN) IV Stopped (08/14/21 1637)   heparin 700 Units/hr (08/14/21 1500)     LOS: 10 days    Time spent: over 30 min 40 min critical care time with AHRF 2/2 covid and mrsa pneumonia on HHFNC  Kayleen Memos, MD [facility]   To contact the attending provider between 7A-7P or the covering provider during after hours 7P-7A, please log into the web site [email or web address] and access using universal [place] password for that web site. If you do not have the password, please call the hospital operator.  08/14/2021, 4:59 PM

## 2021-08-14 NOTE — Progress Notes (Signed)
° °  Pt is still requiring high flow oxygen Pain free  Will schedule cath when she is more stable from a pulmonary standpoint .    Mertie Moores, MD  08/14/2021 11:51 AM    [facility],  [place], [place]  [address] Phone: [phone]; Fax: [phone]

## 2021-08-14 NOTE — Progress Notes (Signed)
Patient requesting to take PM lopressor early because she feels a "twinge" in her chest that feels like prior heart attacks. Says it's not pain but feels like breathing in cold air. EKG obtained, unchanged from previous EKG, troponins sent to lab, chest xray obtained, patient already on IV heparin given additional dose of 81 mg PO aspirin. Unable to give her requested lopressor due to low blood pressure 99/58. Dr. [place] [facility] notified. Will continue with current treatment plan.

## 2021-08-15 DIAGNOSIS — I2510 Atherosclerotic heart disease of native coronary artery without angina pectoris: Secondary | ICD-10-CM | POA: Diagnosis not present

## 2021-08-15 DIAGNOSIS — I214 Non-ST elevation (NSTEMI) myocardial infarction: Secondary | ICD-10-CM | POA: Diagnosis not present

## 2021-08-15 DIAGNOSIS — J9621 Acute and chronic respiratory failure with hypoxia: Secondary | ICD-10-CM | POA: Diagnosis not present

## 2021-08-15 DIAGNOSIS — U071 COVID-19: Secondary | ICD-10-CM | POA: Diagnosis not present

## 2021-08-15 LAB — GLUCOSE, CAPILLARY
Glucose-Capillary: 113 mg/dL — ABNORMAL HIGH (ref 70–99)
Glucose-Capillary: 186 mg/dL — ABNORMAL HIGH (ref 70–99)
Glucose-Capillary: 251 mg/dL — ABNORMAL HIGH (ref 70–99)
Glucose-Capillary: 360 mg/dL — ABNORMAL HIGH (ref 70–99)

## 2021-08-15 LAB — COMPREHENSIVE METABOLIC PANEL
ALT: 16 U/L (ref 0–44)
AST: 19 U/L (ref 15–41)
Albumin: 2.4 g/dL — ABNORMAL LOW (ref 3.5–5.0)
Alkaline Phosphatase: 138 U/L — ABNORMAL HIGH (ref 38–126)
Anion gap: 10 (ref 5–15)
BUN: 58 mg/dL — ABNORMAL HIGH (ref 8–23)
CO2: 31 mmol/L (ref 22–32)
Calcium: 9.4 mg/dL (ref 8.9–10.3)
Chloride: 94 mmol/L — ABNORMAL LOW (ref 98–111)
Creatinine, Ser: 0.92 mg/dL (ref 0.44–1.00)
GFR, Estimated: >60 mL/min (ref >60)
Glucose, Bld: 73 mg/dL (ref 70–99)
Potassium: 3.3 mmol/L — ABNORMAL LOW (ref 3.5–5.1)
Sodium: 135 mmol/L (ref 135–145)
Total Bilirubin: 0.7 mg/dL (ref 0.3–1.2)
Total Protein: 6.1 g/dL — ABNORMAL LOW (ref 6.5–8.1)

## 2021-08-15 LAB — CBC
HCT: 39.9 % (ref 36.0–46.0)
Hemoglobin: 13.2 g/dL (ref 12.0–15.0)
MCH: 31.4 pg (ref 26.0–34.0)
MCHC: 33.1 g/dL (ref 30.0–36.0)
MCV: 94.8 fL (ref 80.0–100.0)
Platelets: 308 10*3/uL (ref 150–400)
RBC: 4.21 MIL/uL (ref 3.87–5.11)
RDW: 15.1 % (ref 11.5–15.5)
WBC: 12.1 10*3/uL — ABNORMAL HIGH (ref 4.0–10.5)
nRBC: 0.2 % (ref 0.0–0.2)

## 2021-08-15 LAB — PHOSPHORUS: Phosphorus: 4.3 mg/dL (ref 2.5–4.6)

## 2021-08-15 LAB — MAGNESIUM: Magnesium: 2.2 mg/dL (ref 1.7–2.4)

## 2021-08-15 LAB — HEPARIN LEVEL (UNFRACTIONATED): Heparin Unfractionated: 0.47 IU/mL (ref 0.30–0.70)

## 2021-08-15 MED ORDER — INSULIN ASPART 100 UNIT/ML IJ SOLN
3.0000 [IU] | Freq: Three times a day (TID) | INTRAMUSCULAR | Status: DC
Start: 2021-08-15 — End: 2021-08-16
  Administered 2021-08-15 (×2): 3 [IU] via SUBCUTANEOUS

## 2021-08-15 MED ORDER — LORAZEPAM 0.5 MG PO TABS
0.5000 mg | ORAL_TABLET | Freq: Two times a day (BID) | ORAL | Status: DC | PRN
  Administered 2021-08-16 – 2021-08-25 (×13): 0.5 mg via ORAL
  Filled 2021-08-15 (×14): qty 1

## 2021-08-15 MED ORDER — POTASSIUM CHLORIDE CRYS ER 20 MEQ PO TBCR
40.0000 meq | EXTENDED_RELEASE_TABLET | Freq: Two times a day (BID) | ORAL | Status: AC
  Administered 2021-08-15 (×2): 40 meq via ORAL
  Filled 2021-08-15 (×2): qty 2

## 2021-08-15 MED ORDER — INSULIN GLARGINE-YFGN 100 UNIT/ML ~~LOC~~ SOLN
5.0000 [IU] | Freq: Every day | SUBCUTANEOUS | Status: DC
Start: 2021-08-15 — End: 2021-08-16
  Administered 2021-08-15: 11:00:00 5 [IU] via SUBCUTANEOUS
  Filled 2021-08-15 (×2): qty 0.05

## 2021-08-15 NOTE — Progress Notes (Signed)
Progress Note  Patient Name: Katherine [name] Date of Encounter: 08/15/2021  [facility] Cardiologist: [name], MD   Subjective   Pt admitted with COVID  Troponins + for NSTEMI  Echo shows mild LV dyfsunction She has not had any significant cp Thinks she had some cp this am O2 sats are still marginal 84% on heated high flow O2   Inpatient Medications    Scheduled Meds:  (feeding supplement) PROSource Plus  30 mL Oral BID BM   arformoterol  15 mcg Nebulization BID   vitamin C  500 mg Oral Daily   aspirin  81 mg Oral Daily   budesonide  2 mL Nebulization BID   calcitRIOL  0.25 mcg Oral Q3 days   Chlorhexidine Gluconate Cloth  6 each Topical Daily   clopidogrel  75 mg Oral Daily   feeding supplement (GLUCERNA SHAKE)  237 mL Oral TID BM   fluticasone  2 spray Each Nare Daily   furosemide  40 mg Intravenous BID   guaiFENesin-dextromethorphan  10 mL Oral QID   insulin aspart  0-20 Units Subcutaneous TID WC   insulin aspart  0-5 Units Subcutaneous QHS   insulin aspart  8 Units Subcutaneous TID WC   insulin glargine-yfgn  25 Units Subcutaneous Daily   isosorbide mononitrate  90 mg Oral Daily   lamoTRIgine  25 mg Oral QHS   linagliptin  5 mg Oral Daily   loratadine  10 mg Oral Daily   magic mouthwash  5 mL Oral QID   mouth rinse  15 mL Mouth Rinse BID   metoprolol tartrate  25 mg Oral BID   montelukast  10 mg Oral QHS   multivitamin with minerals  1 tablet Oral Daily   pantoprazole  40 mg Oral Daily   potassium chloride  40 mEq Oral BID   predniSONE  40 mg Oral Q breakfast   Followed by   Derrill Memo ON 08/17/2021] predniSONE  30 mg Oral Q breakfast   Followed by   Derrill Memo ON 08/19/2021] predniSONE  10 mg Oral Q breakfast   Followed by   Derrill Memo ON 08/21/2021] predniSONE  10 mg Oral Q breakfast   Ensure Max Protein  11 oz Oral Daily   rosuvastatin  20 mg Oral Daily   senna-docusate  2 tablet Oral BID   sertraline  100 mg Oral Daily   sodium chloride flush  10-40 mL  Intracatheter Q12H   zinc sulfate  220 mg Oral Daily   Continuous Infusions:  heparin 700 Units/hr (08/15/21 0600)   PRN Meds: acetaminophen **OR** acetaminophen, cyclobenzaprine, hydrALAZINE, ipratropium-albuterol, LORazepam, menthol-cetylpyridinium, morphine injection, nitroGLYCERIN, ondansetron (ZOFRAN) IV, oxyCODONE, promethazine, sodium chloride, sodium chloride flush   Vital Signs    Vitals:   08/15/21 0603 08/15/21 0800 08/15/21 0828 08/15/21 0829  BP: 106/77     Pulse: 81     Resp: 12     Temp:  97.8 F (36.6 C)    TempSrc:  Axillary    SpO2: 96%  91% 91%  Weight:      Height:        Intake/Output Summary (Last 24 hours) at 08/15/2021 0908 Last data filed at 08/15/2021 0600 Gross per 24 hour  Intake 576.05 ml  Output 2375 ml  Net -1798.95 ml    Last 3 Weights 08/15/2021 08/14/2021 08/13/2021  Weight (lbs) 191 lb 9.3 oz 194 lb 0.1 oz 190 lb 11.2 oz  Weight (kg) 86.9 kg 88 kg 86.5 kg  Telemetry    Sinus rhythm  - Personally Reviewed  ECG    No new - Personally Reviewed  Physical Exam   Physical Exam: Blood pressure 106/77, pulse 81, temperature 97.8 F (36.6 C), temperature source Axillary, resp. rate 12, height 5\' 1"  (1.549 m), weight 86.9 kg, SpO2 91 %.  GEN:  middle age female,  appears older than stated age  38: Normal NECK: No JVD; No carotid bruits LYMPHATICS: No lymphadenopathy CARDIAC: RRR   RESPIRATORY:   rales, wheezes bilat.  Seems to be improving ABDOMEN: Soft, non-tender, non-distended MUSCULOSKELETAL:  No edema; No deformity  SKIN: Warm and dry NEUROLOGIC:  Alert and oriented x 3    Labs    High Sensitivity Troponin:   Recent Labs  Lab 08/11/21 0539 08/11/21 1040 08/11/21 1431 08/12/21 0311 08/14/21 1845  TROPONINIHS 1,660* 1,171* 877* 926* 351*      Chemistry Recent Labs  Lab 08/12/21 0312 08/13/21 0410 08/15/21 0440  NA 133* 131* 135  K 4.1 3.6 3.3*  CL 96* 92* 94*  CO2 28 29 31   GLUCOSE 249* 189* 73   BUN 44* 47* 58*  CREATININE 0.93 0.89 0.92  CALCIUM 8.8* 9.1 9.4  MG 1.9 1.9 2.2  PROT 6.1* 6.1* 6.1*  ALBUMIN 2.4* 2.4* 2.4*  AST 18 16 19   ALT 14 15 16   ALKPHOS 129* 138* 138*  BILITOT 0.8 0.7 0.7  GFRNONAA >60 >60 >60  ANIONGAP 9 10 10      Lipids No results for input(s): CHOL, TRIG, HDL, LABVLDL, LDLCALC, CHOLHDL in the last 168 hours.  Hematology Recent Labs  Lab 08/12/21 0312 08/14/21 0410 08/15/21 0440  WBC 8.0 9.5 12.1*  RBC 3.54* 3.89 4.21  HGB 11.1* 12.5 13.2  HCT 33.1* 36.8 39.9  MCV 93.5 94.6 94.8  MCH 31.4 32.1 31.4  MCHC 33.5 34.0 33.1  RDW 14.9 15.0 15.1  PLT 318 311 308    Thyroid No results for input(s): TSH, FREET4 in the last 168 hours.  BNP Recent Labs  Lab 08/11/21 0309 08/12/21 0312 08/13/21 0457  BNP 202.5* 194.7* 331.3*     DDimer  Recent Labs  Lab 08/10/21 0515 08/11/21 0309 08/13/21 0410  DDIMER 2.01* 1.43* 0.93*      Radiology    DG CHEST PORT 1 VIEW  Result Date: 08/14/2021 CLINICAL DATA:  COVID positive. EXAM: PORTABLE CHEST 1 VIEW COMPARISON:  08/12/2021 FINDINGS: Postoperative changes in the mediastinum. Mild cardiac enlargement. Patchy perihilar infiltrates in both lungs may represent edema or multifocal pneumonia. No pleural effusions. No pneumothorax. Mediastinal contours appear intact. IMPRESSION: Cardiac enlargement with patchy perihilar infiltrates in both lungs, possibly edema or multifocal pneumonia. Electronically Signed   By: Lucienne Capers M.D.   On: 08/14/2021 19:02    Cardiac Studies   Echo 08/09/21  IMPRESSIONS     1. Left ventricular ejection fraction, by estimation, is 45 to 50%. The  left ventricle has mildly decreased function. The left ventricle  demonstrates regional wall motion abnormalities (see scoring  diagram/findings for description). There is mild left  ventricular hypertrophy. Left ventricular diastolic function could not be  evaluated.   2. Right ventricular systolic function is  normal. The right ventricular  size is normal.   3. The mitral valve is grossly normal. Trivial mitral valve  regurgitation.   4. The aortic valve has an indeterminant number of cusps. There is  moderate calcification of the aortic valve. Aortic valve regurgitation is  not visualized. Aortic valve sclerosis/calcification is present, without  any evidence of aortic stenosis.   Comparison(s): Prior images reviewed side by side. Wall motion more  similar to last study with Definity contrast on 07/12/2020, although LVEF  somewhat better.   FINDINGS   Left Ventricle: Left ventricular ejection fraction, by estimation, is 45  to 50%. The left ventricle has mildly decreased function. The left  ventricle demonstrates regional wall motion abnormalities. Definity  contrast agent was given IV to delineate the  left ventricular endocardial borders. The left ventricular internal cavity  size was normal in size. There is mild left ventricular hypertrophy. Left  ventricular diastolic function could not be evaluated.      LV Wall Scoring:  The apex is dyskinetic. The apical septal segment is hypokinetic. The  entire  anterior wall, entire lateral wall, anterior septum, entire inferior wall,  mid inferoseptal segment, and basal inferoseptal segment are normal.   Right Ventricle: The right ventricular size is normal. No increase in  right ventricular wall thickness. Right ventricular systolic function is  normal.   Left Atrium: Left atrial size was normal in size.   Right Atrium: Right atrial size was normal in size.   Pericardium: There is no evidence of pericardial effusion.   Mitral Valve: The mitral valve is grossly normal. Mild mitral annular  calcification. Trivial mitral valve regurgitation.   Tricuspid Valve: The tricuspid valve is grossly normal. Tricuspid valve  regurgitation is mild.   Aortic Valve: The aortic valve has an indeterminant number of cusps. There  is moderate  calcification of the aortic valve. Aortic valve regurgitation  is not visualized. Aortic valve sclerosis/calcification is present,  without any evidence of aortic  stenosis.   Pulmonic Valve: The pulmonic valve was grossly normal. Pulmonic valve  regurgitation is trivial.   Aorta: The aortic root is normal in size and structure.   IAS/Shunts: The interatrial septum was not well visualized.      LEFT VENTRICLE  PLAX 2D  LVIDd:         4.40 cm  LVIDs:         3.30 cm  LV PW:         1.00 cm  LV IVS:        1.10 cm  LVOT diam:     2.00 cm  LVOT Area:     3.14 cm      Patient Profile     65 y.o. 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 Failure with COVID-19 and MRSA PNA seen by cardiology for chest pain and abnormal troponins.    Assessment & Plan    1.  NSTEMI with recent recurrent angina symptoms in the setting of acute illness.  High-sensitivity troponin I up to 4125.  ECG shows inferior and anterolateral T wave inversions.  On ASA, plavix, lopressor, imdur, crestor and IV heparin on heparin and off eliquis since 12/24.   Ultimately consider a follow up diagnostic cath depending on course, her 02 requirements are still elevated.  She seems to be slowly improving .   I would prefer waiting until her O2 requirements improve before we attempt transfer to [facility], cath, etc.     2.  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 See above   3.  Component of acute on chronic systolic  heart failure (echo this admit EF 45-50% in 01/2021 EF was 55-60%). Has diuresed 7 liters so far this admission    4.  Paroxysmal atrial fibrillation with CHA2DS2-VASc score of 6.  Transition from Eliquis to heparin in the setting of NSTEMI.  She is in sinus rhythm at this time.   5.  Acute on chronic  hypoxic respiratory failure with COPD at baseline with home 02, currently with COVID-19 and MRSA pneumonia. She has completed remdesivir and is on IV steroids and doxycycline.  Still requires high flow 02. Per IM "She's immunosuppressed on antirejection meds with renal transplant (not candidate for baricitinib or actemra with MRSA infection and immunosuppression).  Poor prognosis with immunosuppression and covid."   6.  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       For questions or updates, please contact [place] Please consult [email or web address] for contact info under      Mertie Moores, MD  08/15/2021 [facility] [place],  [place], [place]  [address] Phone: [phone]; Fax: [phone]

## 2021-08-15 NOTE — Progress Notes (Signed)
ANTICOAGULATION CONSULT NOTE  Pharmacy Consult for Heparin Indication: chest pain/ACS  Allergies  Allergen Reactions   Tetracycline Hives    Patient tolerated Doxycycline Dec 2020   Niacin Other (See Comments)    Mouth blisters   Niaspan [Niacin Er] Other (See Comments)    Mouth blisters   Sulfa Antibiotics Nausea Only and Other (See Comments)    "Tears up stomach"   Sulfonamide Derivatives Other (See Comments)    Reaction: per patient "tears her stomach up"   Codeine Nausea And Vomiting   Erythromycin Nausea And Vomiting   Hydromorphone Hcl Nausea And Vomiting   Morphine And Related Nausea And Vomiting   Nalbuphine Nausea And Vomiting    Nubain   Sulfasalazine Nausea Only and Other (See Comments)    per patient "tears her stomach up", "Tears up stomach"   Tape Rash and Other (See Comments)    No "plastic" tape," please----cloth tape only    Patient Measurements: Height: 5\' 1"  (154.9 cm) Weight: 86.9 kg (191 lb 9.3 oz) IBW/kg (Calculated) : 47.8 Heparin Dosing Weight: 66kg  Vital Signs: Temp: 98.4 F (36.9 C) (12/30 0400) Temp Source: Oral (12/30 0400) BP: 100/54 (12/30 0100) Pulse Rate: 82 (12/30 0309)  Labs: Recent Labs    08/13/21 0410 08/14/21 0409 08/14/21 0410 08/14/21 1845 08/15/21 0440  HGB  --   --  12.5  --  13.2  HCT  --   --  36.8  --  39.9  PLT  --   --  311  --  308  APTT 46*  --   --   --   --   HEPARINUNFRC 0.59 0.46  --   --  0.47  CREATININE 0.89  --   --   --  0.92  TROPONINIHS  --   --   --  351*  --       Medications:  Infusions:   doxycycline (VIBRAMYCIN) IV 100 mg (08/15/21 0438)   heparin 700 Units/hr (08/15/21 0000)    Assessment: 65 yoF, Covid +, requiring high O2 administration. Complaint of chest pain, Troponin elevated.  PTA Apixaban for Afib transitioned to heparin infusion in the setting of NSTEMI.   - Last dose Apixaban 12/24 at 0900.  Note that Apixaban will artificially elevate Hep levels, will dose based on aPTT  until both levels correlate Troponin: 16, 150, 928, 2835, peak at 4125 >> now downtrending  Heparin level falsely elevated due to recent apixaban.  Today, 08/15/21 - Heparin level therapeutic at 0.47 - CBC WNL - no bleeding noted - Originally planned for cardiac cath either 12/29 or 12/30; however this is dependent on if O2 requirements improve. May be pushed to next week. Will need to remain on heparin per cardiology  Goal of Therapy:  Heparin level 0.3-0.7 units/ml Monitor platelets by anticoagulation protocol: Yes   Plan:  Continue heparin drip at 700 units/hour Daily heparin level and CBC Continue to monitor H&H and platelets F/u cardiac cath plans and if/when heparin should be held F/u ability to transition back to [place] 08/15/2021, 5:22 AM

## 2021-08-15 NOTE — Progress Notes (Signed)
Met with [name] at bedside at the request of staff tonight.  Katherine [name] was eager to talk and accept spiritual care and comfort.  She spoke of her extreme exhaustion and wanting to be well.  She spoke of her emotions regarding her illness.  Chaplain provided spiritual care and encouragement and counsel.  Ended visit with a departing prayer.      08/15/21 1700  Clinical Encounter Type  Visited With Patient  Visit Type Initial;Spiritual support  Referral From Nurse  Consult/Referral To Chaplain  Spiritual Encounters  Spiritual Needs Prayer;Emotional  Stress Factors  Patient Stress Factors Health changes;Loss;Loss of control  Family Stress Factors None identified

## 2021-08-15 NOTE — Progress Notes (Signed)
Occupational Therapy Treatment Patient Details Name: Antoine Fiallos MRN: [id number] DOB: 02-28-1956 Today's Date: 08/15/2021   History of present illness Pt admitted from home 2* SOB with COVID PNA and with hx of CAD, CABG, PAD, PAF, COPD, Fibromyalgia, R eye CVA with residual blindness, kidney transplant and obesity   OT comments  Pt requiring encouragement for OOB, but had urine soaked bed without awareness, so willing to get OOB to chair. Pt requiring min assist, RW and +2 for safety/lines for all mobility. Set up for grooming, mod assist for UB dressing and total assist for pericare. Pt remains on HFNC.    Recommendations for follow up therapy are one component of a multi-disciplinary discharge planning process, led by the attending physician.  Recommendations may be updated based on patient status, additional functional criteria and insurance authorization.    Follow Up Recommendations  Home health OT    Assistance Recommended at Discharge Frequent or constant Supervision/Assistance  Equipment Recommendations  Other (comment) (TBD)    Recommendations for Other Services      Precautions / Restrictions Precautions Precautions: Fall Precaution Comments: monitor sats Restrictions Weight Bearing Restrictions: No       Mobility Bed Mobility Overal bed mobility: Needs Assistance Bed Mobility: Rolling;Sidelying to Sit Rolling: Min assist Sidelying to sit: Min assist       General bed mobility comments: Increased time with cues for technique and assist to bring trunk to upright    Transfers Overall transfer level: Needs assistance Equipment used: Rolling walker (2 wheels) Transfers: Sit to/from Stand;Bed to chair/wheelchair/BSC Sit to Stand: Min assist;+2 safety/equipment;From elevated surface   Step pivot transfers: Min assist;+2 safety/equipment       General transfer comment: Increased time with cues for LE management and use of UEs to self assist.  Step pvt  bed to chair with use of RW     Balance Overall balance assessment: Needs assistance Sitting-balance support: No upper extremity supported Sitting balance-Leahy Scale: Fair     Standing balance support: Bilateral upper extremity supported Standing balance-Leahy Scale: Poor                             ADL either performed or assessed with clinical judgement   ADL Overall ADL's : Needs assistance/impaired     Grooming: Oral care;Wash/dry hands;Wash/dry face;Sitting;Set up           Upper Body Dressing : Moderate assistance;Sitting           Toileting- Clothing Manipulation and Hygiene: +2 for physical assistance;Total assistance;Sit to/from stand              Extremity/Trunk Assessment Upper Extremity Assessment Upper Extremity Assessment: Overall WFL for tasks assessed   Lower Extremity Assessment Lower Extremity Assessment: Generalized weakness LLE Deficits / Details: Limited flexion L knee - pt states this has been long term   Cervical / Trunk Assessment Cervical / Trunk Assessment: Normal    Vision       Perception     Praxis      Cognition Arousal/Alertness: Awake/alert Behavior During Therapy: WFL for tasks assessed/performed Overall Cognitive Status: Within Functional Limits for tasks assessed                                 General Comments: requires encouragement          Exercises     Shoulder Instructions  General Comments      Pertinent Vitals/ Pain       Pain Assessment: Faces Faces Pain Scale: No hurt  Home Living Family/patient expects to be discharged to:: Private residence Living Arrangements: Other relatives Available Help at Discharge: Family;Available 24 hours/day Type of Home: House Home Access: Stairs to enter [employer] of Steps: 5 Entrance Stairs-Rails: Left;Right Home Layout: One level     Bathroom Shower/Tub: Teacher, early years/pre: Standard Bathroom  Accessibility: Yes   Home Equipment: Rollator (4 wheels);Cane - single point;Tub bench;Grab bars - toilet;Wheelchair - manual   Additional Comments: wears 3L of supplemental O2 at home      Prior Functioning/Environment              Frequency  Min 2X/week        Progress Toward Goals  OT Goals(current goals can now be found in the care plan section)  Progress towards OT goals: Progressing toward goals  Acute Rehab OT Goals OT Goal Formulation: With patient Time For Goal Achievement: 08/27/21 Potential to Achieve Goals: [place] Discharge plan remains appropriate    Co-evaluation    PT/OT/SLP Co-Evaluation/Treatment: Yes Reason for Co-Treatment: For patient/therapist safety;Complexity of the patient's impairments (multi-system involvement) PT goals addressed during session: Mobility/safety with mobility OT goals addressed during session: ADL's and self-care      AM-PAC OT "6 Clicks" Daily Activity     Outcome Measure   Help from another person eating meals?: None Help from another person taking care of personal grooming?: A Little Help from another person toileting, which includes using toliet, bedpan, or urinal?: Total Help from another person bathing (including washing, rinsing, drying)?: A Lot Help from another person to put on and taking off regular upper body clothing?: A Lot Help from another person to put on and taking off regular lower body clothing?: Total 6 Click Score: 13    End of Session Equipment Utilized During Treatment: Rolling walker (2 wheels);Oxygen  OT Visit Diagnosis: Muscle weakness (generalized) (M62.81)   Activity Tolerance Patient limited by fatigue   Patient Left in chair;with call bell/phone within reach;with chair alarm set   Nurse Communication Mobility status        Time: 6384-6659 OT Time Calculation (min): 34 min  Charges: OT General Charges $OT Visit: 1 Visit OT Treatments $Self Care/Home Management : 8-22  mins  Malka So 08/15/2021, 3:26 PM Nestor Lewandowsky, OTR/L Acute Rehabilitation Services Pager: [phone] Office: [phone]

## 2021-08-15 NOTE — Progress Notes (Addendum)
PROGRESS NOTE    Katherine [name]  BJS:[id number] DOB: [birth year] DOA: 08/04/2021 PCP: [name], MD  Chief Complaint  Patient presents with   generalized weakness    Brief Narrative:  This is a 65 year old female with anxiety, depression, CAD/CABG, chronic hypoxic respiratory failure related to COPD on 3 L of oxygen at home, type 2 diabetes mellitus, end-stage renal disease status post renal transplant, GERD, obesity, hypertension paroxysmal atrial fibrillation, PAD who presented to the hospital on 12/19 with shortness of breath and generalized weakness.  She was found to have a pulse ox of 84% on her usual oxygen, noted to be wheezing and had a cough with yellow sputum.  COVID PCR noted to be positive.  She's been admitted for COVID 19 pneumonia and now found to have MRSA pneumonia.  She's had progressively worsening O2 needs, now on heated high flow.    See below for additional details   08/15/2021: Patient seen and examined at her bedside.  She has no new complaints.  Advised to get out of bed to chair and participate with therapies.  Patient has been unwilling to cooperate with physical activity.  Has been requesting Ativan more frequently.  Flat affect, anxious.  Updated her niece via phone, receptive to psychiatry consult to assist with her chronic anxiety and depression.   Assessment & Plan:   Principal Problem:   Acute on chronic respiratory failure with hypoxia (HCC) Active Problems:   Hypomagnesemia   Dyslipidemia, goal LDL below 70   NSTEMI (non-ST elevated myocardial infarction) (HCC)   CAD S/P percutaneous coronary angioplasty - PCI x 5 to SVG-D1   Essential hypertension   PAD (peripheral artery disease) (HCC)   Paroxysmal atrial fibrillation (HCC); CHA2DS2VASc score F, HTN, CAD, CVA = 5   OSA (obstructive sleep apnea)   GAD (generalized anxiety disorder)   Acute CHF (congestive heart failure) ([place])   History of renal transplant   Uncontrolled type 2 diabetes  mellitus with hyperglycemia, 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  Possible contribution from HF. Uses 2 LPM at home COVID test + on 12/18 CXR 12/24 with worsening severe diffuse bilateral airspace disease CT PE protocol chest 08/05/2021: limited for PE, but negative, extensive heterogenous and GGO throughout lungs  Satting in 90's, on 25 L HHFNC (slowly improving) Elevated BNP -> lasix as tolerated Completed remdesivir.  Continue IV steroids for covid and abx to cover for MRSA pneumonia She's immunosuppressed on antirejection meds with renal transplant (not candidate for baricitinib or actemra with MRSA infection and immunosuppression).  Poor prognosis with immunosuppression and covid.  COVID-19 Labs  Recent Labs    08/10/21 0515 08/11/21 0309  DDIMER 2.01* 1.43*  CRP  --  2.4*    COVID-19 Labs  Recent Labs    08/10/21 0515 08/11/21 0309  DDIMER 2.01* 1.43*  CRP  --  2.4*    Lab Results  Component Value Date   SARSCOV2NAA POSITIVE (A) 08/04/2021   SARSCOV2NAA NEGATIVE 05/12/2021   SARSCOV2NAA NEGATIVE 04/19/2021   SARSCOV2NAA NEGATIVE 03/30/2021     Positive D dimer On anticoagulation Follow LE [place] - negative  MRSA  pneumonia ([place]) 12/20 sputum cx with MRSA (and candida albicans - suspect this is contaminant/colonizer)  On doxycycline, plan for 7 days (12/23 - present) As above  Chronic diastolic CHF (congestive heart failure) ([place])- (present on admission) Grade 1 diastolic dysfunction  Obesity (BMI 30-39.9)- (present on admission) Body mass index is 37.03 kg/m.  Placing the patient at high risk of poor outcome.   Monitor.  COVID-19 virus infection- (present on admission) As above Steroids, remdesivir (s/p 5 doses) Tapering steroids  Pressure injury of skin- (present on admission) Stage II, medial rectal ulcer. Present on admission. 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 Receives prednisone and Imuran. Placing the patient at high risk for poor outcome secondary to chronic immunosuppressive status  Acute CHF (congestive heart failure) (HCC) Elevated BNP Follow echo - EF 45-50%, RWMA (see report) Lasix as tolerated, follow renal function Strict I/O, daily weights - net negative  GAD (generalized anxiety disorder)- (present on admission) Continue Zoloft and Ativan.  OSA (obstructive sleep apnea)- (present on admission) Does not use CPAP at night after lap band.  Paroxysmal atrial fibrillation (HCC); CHA2DS2VASc score F, HTN, CAD, CVA = 5- (present on admission) Continue Metoprolol (dose reduced due to brady) and Eliquis -> now on heparin gtt  PAD (peripheral artery disease) ([place])- (present on admission) Prior history.  Monitor.  Essential hypertension Recently improved, follow Continue home BP meds, consider adding meds pending trend  CAD S/P percutaneous coronary angioplasty - PCI x 5 to SVG-D1 Prior history.  Monitor. W/u for chest pain above  NSTEMI (non-ST elevated myocardial infarction) ([place]) Episode of CP similar to prior heart attacks on 12/24 Continue nitro prn  Was already anticoagulated on eliquis, antiplatelet on plavix -> given concerning symptoms/rising troponin -> transitioned to heparin gtt, aspirin added to placix EF 45-50%, RWMA's (see report) Cardiology c/s, appreciate  assistance -> plan for aspirin, plavix, imdur (increased dose), lopressor, crestor - consider arb/arni if BP remains high, consider cath pending clinical status (note 12/25) Would want her O2 needs to improve prior to diagnostic cath - still has significant O2 needs Ok to work with PT/OT if asymptomatic  Dyslipidemia, goal LDL below 70- (present on admission) Continue statin.  Hypomagnesemia- (present on admission) Replaced.  Resolved hyponatremia, serum sodium 135. Resolved Hypomagnesemia, serum mag 2.2. Hyperglycemia is improving.  Hypercarbia is improving  Persistent Generalized weakness PT OT to assess Fall precautions Patient encouraged to participate with therapies  MRSA pneumonia Personally reviewed last chest x-ray showing improvement of infiltrates. Repeated procalcitonin level less than 0.10   Hypokalemia K+ 3.3 Kcl 40 meq x2 replacement   DVT prophylaxis: Heparin gtt (previously eliquis) Code Status: Full Family Communication: none at bedside - discussed with niece 12/24, no answer 12/25 Disposition:   Status is: Inpatient  Remains inpatient appropriate because: sig O2 requirement, NSTEMI       Consultants:  cardiology  Procedures:  echo IMPRESSIONS     1. Left ventricular ejection fraction, by estimation, is 45 to 50%. The  left ventricle has mildly decreased function. The left ventricle  demonstrates regional wall motion abnormalities (see scoring  diagram/findings for description). There is mild left  ventricular hypertrophy. Left ventricular diastolic function could not be  evaluated.   2. Right ventricular systolic function is normal.  The right ventricular  size is normal.   3. The mitral valve is grossly normal. Trivial mitral valve  regurgitation.   4. The aortic valve has an indeterminant number of cusps. There is  moderate calcification of the aortic valve. Aortic valve regurgitation is  not visualized. Aortic valve  sclerosis/calcification is present, without  any evidence of aortic stenosis.   Comparison(s): Prior images reviewed side by side. Wall motion more  similar to last study with Definity contrast on 07/12/2020, although LVEF  somewhat better.   Summary:  BILATERAL:  -No evidence of popliteal cyst, bilaterally.      Antimicrobials:  Anti-infectives (From admission, onward)    Start     Dose/Rate Route Frequency Ordered Stop   08/08/21 1400  doxycycline (VIBRAMYCIN) 100 mg in sodium chloride 0.9 % 250 mL IVPB        100 mg 125 mL/hr over 120 Minutes Intravenous Every 12 hours 08/08/21 1327 08/15/21 1046   08/06/21 1000  remdesivir 100 mg in sodium chloride 0.9 % 100 mL IVPB  Status:  Discontinued       See Hyperspace for full Linked Orders Report.   100 mg 200 mL/hr over 30 Minutes Intravenous Daily 08/05/21 0720 08/05/21 0720   08/06/21 1000  remdesivir 100 mg in sodium chloride 0.9 % 100 mL IVPB        100 mg 200 mL/hr over 30 Minutes Intravenous Daily 08/05/21 0721 08/09/21 0937   08/05/21 1000  remdesivir 100 mg in sodium chloride 0.9 % 100 mL IVPB  Status:  Discontinued       See Hyperspace for full Linked Orders Report.   100 mg 200 mL/hr over 30 Minutes Intravenous Daily 08/04/21 0819 08/05/21 0720   08/05/21 1000  remdesivir 200 mg in sodium chloride 0.9% 250 mL IVPB        200 mg 580 mL/hr over 30 Minutes Intravenous Once 08/05/21 0719 08/05/21 1150   08/05/21 1000  cefTRIAXone (ROCEPHIN) 2 g in sodium chloride 0.9 % 100 mL IVPB        2 g 200 mL/hr over 30 Minutes Intravenous Every 24 hours 08/05/21 0815 08/09/21 1032   08/05/21 1000  azithromycin (ZITHROMAX) 500 mg in sodium chloride 0.9 % 250 mL IVPB  Status:  Discontinued        500 mg 250 mL/hr over 60 Minutes Intravenous Every 24 hours 08/05/21 0815 08/05/21 0907   08/04/21 0930  remdesivir 200 mg in sodium chloride 0.9% 250 mL IVPB  Status:  Discontinued       See Hyperspace for full Linked Orders Report.   200  mg 580 mL/hr over 30 Minutes Intravenous Once 08/04/21 0819 08/05/21 0720        Objective: Vitals:   08/15/21 0828 08/15/21 0829 08/15/21 1000 08/15/21 1100  BP:   (!) 90/46   Pulse:   85   Resp:   (!) 23   Temp:    98.4 F (36.9 C)  TempSrc:    Axillary  SpO2: 91% 91% (!) 82%   Weight:      Height:        Intake/Output Summary (Last 24 hours) at 08/15/2021 1308 Last data filed at 08/15/2021 0818 Gross per 24 hour  Intake 548.79 ml  Output 2275 ml  Net -1726.21 ml   Filed Weights   08/13/21 0517 08/14/21 0500 08/15/21 0441  Weight: 86.5 kg 88 kg 86.9 kg    Examination:  General: well developed well nourished in no acute  distress.  A&O x 3 Cardiovascular: RRR no rubs or gallops Lungs: Mild rales, no wheezes Abdomen: Soft non tender bowel sounds  Neurological: Non focal exam Skin: No rashes Extremities: No Lower extremity edema  Data Reviewed: I have personally reviewed following labs and imaging studies  CBC: Recent Labs  Lab 08/09/21 0253 08/10/21 0515 08/11/21 0309 08/12/21 0312 08/14/21 0410 08/15/21 0440  WBC 10.1 9.2 8.4 8.0 9.5 12.1*  NEUTROABS 9.3* 8.4* 7.9* 7.3  --   --   HGB 11.2* 10.6* 11.6* 11.1* 12.5 13.2  HCT 34.7* 32.0* 35.0* 33.1* 36.8 39.9  MCV 98.0 95.5 96.2 93.5 94.6 94.8  PLT 275 315 332 318 311 944    Basic Metabolic Panel: Recent Labs  Lab 08/10/21 0515 08/11/21 0309 08/12/21 0312 08/13/21 0410 08/15/21 0440  NA 138 137 133* 131* 135  K 3.4* 4.3 4.1 3.6 3.3*  CL 99 98 96* 92* 94*  CO2 31 30 28 29 31   GLUCOSE 119* 211* 249* 189* 73  BUN 38* 41* 44* 47* 58*  CREATININE 0.77 0.87 0.93 0.89 0.92  CALCIUM 8.4* 8.7* 8.8* 9.1 9.4  MG 2.1 2.2 1.9 1.9 2.2  PHOS 3.7 3.1 3.1 3.5 4.3    GFR: Estimated Creatinine Clearance: 61.8 mL/min (by C-G formula based on SCr of 0.92 mg/dL).  Liver Function Tests: Recent Labs  Lab 08/10/21 0515 08/11/21 0309 08/12/21 0312 08/13/21 0410 08/15/21 0440  AST 27 23 18 16 19   ALT 11  13 14 15 16   ALKPHOS 99 115 129* 138* 138*  BILITOT 0.5 0.6 0.8 0.7 0.7  PROT 5.9* 6.4* 6.1* 6.1* 6.1*  ALBUMIN 2.3* 2.6* 2.4* 2.4* 2.4*    CBG: Recent Labs  Lab 08/14/21 1120 08/14/21 1628 08/14/21 2136 08/15/21 0808 08/15/21 1140  GLUCAP 311* 305* 71 113* 186*     Recent Results (from the past 240 hour(s))  Expectorated Sputum Assessment w Gram Stain, Rflx to Resp Cult     Status: None   Collection Time: 08/05/21  1:29 PM   Specimen: SPU  Result Value Ref Range Status   Specimen Description SPUTUM  Final   Special Requests NONE  Final   Sputum evaluation   Final    THIS SPECIMEN IS ACCEPTABLE FOR SPUTUM CULTURE Performed at [facility], [place] [address], [place], [place] [address]    Report Status 08/05/2021 FINAL  Final  Culture, Respiratory w Gram Stain     Status: None   Collection Time: 08/05/21  1:29 PM   Specimen: SPU  Result Value Ref Range Status   Specimen Description   Final    SPUTUM Performed at [facility] [address] [place], [place] [address]    Special Requests   Final    NONE Reflexed from [id number] Performed at [facility], [place] [address] [place], [place] [address]    Gram Stain   Final    ABUNDANT WBC PRESENT, PREDOMINANTLY MONONUCLEAR MODERATE YEAST FEW GRAM POSITIVE RODS FEW GRAM POSITIVE COCCI Performed at [facility], [place] [address], [place],  [address]    Culture   Final    MODERATE METHICILLIN RESISTANT STAPHYLOCOCCUS AUREUS FEW CANDIDA ALBICANS    Report Status 08/08/2021 FINAL  Final   Organism ID, Bacteria METHICILLIN RESISTANT STAPHYLOCOCCUS AUREUS  Final      Susceptibility   Methicillin resistant staphylococcus aureus - MIC*    CIPROFLOXACIN >=8 RESISTANT Resistant     ERYTHROMYCIN >=8 RESISTANT Resistant  GENTAMICIN <=0.5 SENSITIVE Sensitive     OXACILLIN >=4 RESISTANT Resistant     TETRACYCLINE <=1 SENSITIVE Sensitive     VANCOMYCIN <=0.5  SENSITIVE Sensitive     TRIMETH/SULFA <=10 SENSITIVE Sensitive     CLINDAMYCIN >=8 RESISTANT Resistant     RIFAMPIN <=0.5 SENSITIVE Sensitive     Inducible Clindamycin NEGATIVE Sensitive     * MODERATE METHICILLIN RESISTANT STAPHYLOCOCCUS AUREUS         Radiology Studies: DG CHEST PORT 1 VIEW  Result Date: 08/14/2021 CLINICAL DATA:  COVID positive. EXAM: PORTABLE CHEST 1 VIEW COMPARISON:  08/12/2021 FINDINGS: Postoperative changes in the mediastinum. Mild cardiac enlargement. Patchy perihilar infiltrates in both lungs may represent edema or multifocal pneumonia. No pleural effusions. No pneumothorax. Mediastinal contours appear intact. IMPRESSION: Cardiac enlargement with patchy perihilar infiltrates in both lungs, possibly edema or multifocal pneumonia. Electronically Signed   By: Lucienne Capers M.D.   On: 08/14/2021 19:02        Scheduled Meds:  (feeding supplement) PROSource Plus  30 mL Oral BID BM   arformoterol  15 mcg Nebulization BID   vitamin C  500 mg Oral Daily   aspirin  81 mg Oral Daily   budesonide  2 mL Nebulization BID   calcitRIOL  0.25 mcg Oral Q3 days   Chlorhexidine Gluconate Cloth  6 each Topical Daily   clopidogrel  75 mg Oral Daily   feeding supplement (GLUCERNA SHAKE)  237 mL Oral TID BM   fluticasone  2 spray Each Nare Daily   furosemide  40 mg Intravenous BID   guaiFENesin-dextromethorphan  10 mL Oral QID   insulin aspart  0-5 Units Subcutaneous QHS   insulin aspart  3 Units Subcutaneous TID WC   insulin glargine-yfgn  5 Units Subcutaneous Daily   isosorbide mononitrate  90 mg Oral Daily   lamoTRIgine  25 mg Oral QHS   linagliptin  5 mg Oral Daily   loratadine  10 mg Oral Daily   magic mouthwash  5 mL Oral QID   mouth rinse  15 mL Mouth Rinse BID   metoprolol tartrate  25 mg Oral BID   montelukast  10 mg Oral QHS   multivitamin with minerals  1 tablet Oral Daily   pantoprazole  40 mg Oral Daily   potassium chloride  40 mEq Oral BID    predniSONE  40 mg Oral Q breakfast   Followed by   Derrill Memo ON 08/17/2021] predniSONE  30 mg Oral Q breakfast   Followed by   Derrill Memo ON 08/19/2021] predniSONE  10 mg Oral Q breakfast   Followed by   Derrill Memo ON 08/21/2021] predniSONE  10 mg Oral Q breakfast   Ensure Max Protein  11 oz Oral Daily   rosuvastatin  20 mg Oral Daily   senna-docusate  2 tablet Oral BID   sertraline  100 mg Oral Daily   sodium chloride flush  10-40 mL Intracatheter Q12H   zinc sulfate  220 mg Oral Daily   Continuous Infusions:  heparin 700 Units/hr (08/15/21 1130)     LOS: 11 days    Time spent: over 30 min 40 min critical care time with AHRF 2/2 covid and mrsa pneumonia on HHFNC  Kayleen Memos, MD [facility]   To contact the attending provider between 7A-7P or the covering provider during after hours 7P-7A, please log into the web site [email or web address] and access using universal [place] password for that web site. If you do not have  the password, please call the hospital operator.  08/15/2021, 1:08 PM

## 2021-08-15 NOTE — Evaluation (Signed)
Physical Therapy Re-Evaluation Patient Details Name: Katherine [name] MRN: [id number] DOB: 1955/09/18 Today's Date: 08/15/2021  History of Present Illness  Pt admitted from home 2* SOB with COVID PNA and with hx of CAD, CABG, PAD, PAF, COPD, Fibromyalgia, R eye CVA with residual blindness, kidney transplant and obesity  Clinical Impression  Pt admitted as above and presenting with functional mobility limitations 2* generalized weakness and deconditioning, limited endurance, and balance deficits.  Pt could benefit from follow up rehab at SNF level to maximize IND and safety but is refusing same so HHPT recommended to follow up with pt at home.     Recommendations for follow up therapy are one component of a multi-disciplinary discharge planning process, led by the attending physician.  Recommendations may be updated based on patient status, additional functional criteria and insurance authorization.  Follow Up Recommendations Home health PT (Pt refusing SNF)    Assistance Recommended at Discharge Frequent or constant Supervision/Assistance  Functional Status Assessment Patient has had a recent decline in their functional status and demonstrates the ability to make significant improvements in function in a reasonable and predictable amount of time.  Equipment Recommendations  None recommended by PT    Recommendations for Other Services       Precautions / Restrictions Precautions Precautions: Fall Precaution Comments: monitor sats Restrictions Weight Bearing Restrictions: No      Mobility  Bed Mobility Overal bed mobility: Needs Assistance Bed Mobility: Rolling;Sidelying to Sit Rolling: Min assist Sidelying to sit: Min assist       General bed mobility comments: Increased time with cues for technique and assist to bring trunk to upright    Transfers Overall transfer level: Needs assistance Equipment used: Rolling walker (2 wheels) Transfers: Sit to/from Stand;Bed to  chair/wheelchair/BSC Sit to Stand: Min assist;+2 safety/equipment;From elevated surface   Step pivot transfers: Min assist;+2 safety/equipment       General transfer comment: Increased time with cues for LE management and use of UEs to self assist.  Step pvt bed to chair with use of RW    Ambulation/Gait               General Gait Details: step pvt bed to chair only - distance restricted by warmed high flow O2  Stairs            Wheelchair Mobility    Modified Rankin (Stroke Patients Only)       Balance Overall balance assessment: Needs assistance Sitting-balance support: No upper extremity supported Sitting balance-Leahy Scale: Fair     Standing balance support: Bilateral upper extremity supported Standing balance-Leahy Scale: Poor                               Pertinent Vitals/Pain Pain Assessment: No/denies pain    Home Living Family/patient expects to be discharged to:: Private residence Living Arrangements: Other relatives Available Help at Discharge: Family;Available 24 hours/day Type of Home: House Home Access: Stairs to enter Entrance Stairs-Rails: Chemical engineer of Steps: 5   Home Layout: One level Home Equipment: Rollator (4 wheels);Cane - single point;Tub bench;Grab bars - toilet;Wheelchair - manual Additional Comments: wears 3L of supplemental O2 at home    Prior Function Prior Level of Function : Independent/Modified Independent                     Hand Dominance   Dominant Hand: Right    Extremity/Trunk Assessment  Upper Extremity Assessment Upper Extremity Assessment: Overall WFL for tasks assessed    Lower Extremity Assessment Lower Extremity Assessment: Generalized weakness LLE Deficits / Details: Limited flexion L knee - pt states this has been long term    Cervical / Trunk Assessment Cervical / Trunk Assessment: Normal  Communication   Communication: No difficulties  Cognition  Arousal/Alertness: Awake/alert Behavior During Therapy: WFL for tasks assessed/performed Overall Cognitive Status: Within Functional Limits for tasks assessed                                          General Comments      Exercises     Assessment/Plan    PT Assessment Patient needs continued PT services  PT Problem List Decreased range of motion;Decreased activity tolerance;Decreased balance;Decreased mobility;Decreased knowledge of use of DME;Obesity       PT Treatment Interventions DME instruction;Gait training;Stair training;Functional mobility training;Therapeutic activities;Therapeutic exercise;Balance training;Patient/family education    PT Goals (Current goals can be found in the Care Plan section)  Acute Rehab PT Goals Patient Stated Goal: HOME PT Goal Formulation: With patient Time For Goal Achievement: 08/29/21 Potential to Achieve Goals: Good    Frequency Min 3X/week   Barriers to discharge        Co-evaluation PT/OT/SLP Co-Evaluation/Treatment: Yes Reason for Co-Treatment: For patient/therapist safety PT goals addressed during session: Mobility/safety with mobility OT goals addressed during session: ADL's and self-care       AM-PAC PT "6 Clicks" Mobility  Outcome Measure Help needed turning from your back to your side while in a flat bed without using bedrails?: A Little Help needed moving from lying on your back to sitting on the side of a flat bed without using bedrails?: A Little Help needed moving to and from a bed to a chair (including a wheelchair)?: A Lot Help needed standing up from a chair using your arms (e.g., wheelchair or bedside chair)?: A Lot Help needed to walk in hospital room?: A Lot Help needed climbing 3-5 steps with a railing? : Total 6 Click Score: 13    End of Session Equipment Utilized During Treatment: Gait belt;Oxygen Activity Tolerance: Patient limited by fatigue Patient left: with call bell/phone within  reach;in chair;with chair alarm set Nurse Communication: Mobility status PT Visit Diagnosis: Difficulty in walking, not elsewhere classified (R26.2)    Time: 4128-7867 PT Time Calculation (min) (ACUTE ONLY): 28 min   Charges:   PT Evaluation $PT Re-evaluation: 1 Re-eval PT Treatments $Therapeutic Activity: 8-22 mins        [place] Pager [phone] Office [phone]   Katherine [name] 08/15/2021, 2:41 PM

## 2021-08-16 DIAGNOSIS — I214 Non-ST elevation (NSTEMI) myocardial infarction: Secondary | ICD-10-CM

## 2021-08-16 DIAGNOSIS — F411 Generalized anxiety disorder: Secondary | ICD-10-CM | POA: Diagnosis not present

## 2021-08-16 DIAGNOSIS — U071 COVID-19: Principal | ICD-10-CM

## 2021-08-16 DIAGNOSIS — J9621 Acute and chronic respiratory failure with hypoxia: Secondary | ICD-10-CM | POA: Diagnosis not present

## 2021-08-16 LAB — BASIC METABOLIC PANEL
Anion gap: 9 (ref 5–15)
BUN: 62 mg/dL — ABNORMAL HIGH (ref 8–23)
CO2: 29 mmol/L (ref 22–32)
Calcium: 9 mg/dL (ref 8.9–10.3)
Chloride: 94 mmol/L — ABNORMAL LOW (ref 98–111)
Creatinine, Ser: 0.82 mg/dL (ref 0.44–1.00)
GFR, Estimated: >60 mL/min (ref >60)
Glucose, Bld: 128 mg/dL — ABNORMAL HIGH (ref 70–99)
Potassium: 4.1 mmol/L (ref 3.5–5.1)
Sodium: 132 mmol/L — ABNORMAL LOW (ref 135–145)

## 2021-08-16 LAB — CBC
HCT: 41 % (ref 36.0–46.0)
Hemoglobin: 13.4 g/dL (ref 12.0–15.0)
MCH: 30.8 pg (ref 26.0–34.0)
MCHC: 32.7 g/dL (ref 30.0–36.0)
MCV: 94.3 fL (ref 80.0–100.0)
Platelets: 315 10*3/uL (ref 150–400)
RBC: 4.35 MIL/uL (ref 3.87–5.11)
RDW: 15 % (ref 11.5–15.5)
WBC: 12.8 10*3/uL — ABNORMAL HIGH (ref 4.0–10.5)
nRBC: 0.2 % (ref 0.0–0.2)

## 2021-08-16 LAB — GLUCOSE, CAPILLARY
Glucose-Capillary: 98 mg/dL (ref 70–99)
Glucose-Capillary: 154 mg/dL — ABNORMAL HIGH (ref 70–99)
Glucose-Capillary: 129 mg/dL — ABNORMAL HIGH (ref 70–99)
Glucose-Capillary: 164 mg/dL — ABNORMAL HIGH (ref 70–99)

## 2021-08-16 LAB — HEPARIN LEVEL (UNFRACTIONATED): Heparin Unfractionated: 0.54 IU/mL (ref 0.30–0.70)

## 2021-08-16 MED ORDER — INSULIN ASPART 100 UNIT/ML IJ SOLN
5.0000 [IU] | Freq: Three times a day (TID) | INTRAMUSCULAR | Status: DC
  Administered 2021-08-16 – 2021-08-17 (×5): 5 [IU] via SUBCUTANEOUS

## 2021-08-16 MED ORDER — INSULIN GLARGINE-YFGN 100 UNIT/ML ~~LOC~~ SOLN
10.0000 [IU] | Freq: Every day | SUBCUTANEOUS | Status: DC
Start: 2021-08-16 — End: 2021-08-17
  Administered 2021-08-16 – 2021-08-17 (×2): 10 [IU] via SUBCUTANEOUS
  Filled 2021-08-16 (×2): qty 0.1

## 2021-08-16 MED ORDER — INSULIN ASPART 100 UNIT/ML IJ SOLN
0.0000 [IU] | Freq: Every day | INTRAMUSCULAR | Status: DC
  Administered 2021-08-17: 4 [IU] via SUBCUTANEOUS
  Administered 2021-08-18: 23:00:00 5 [IU] via SUBCUTANEOUS
  Administered 2021-08-19: 22:00:00 2 [IU] via SUBCUTANEOUS
  Administered 2021-08-20: 5 [IU] via SUBCUTANEOUS
  Administered 2021-08-21: 2 [IU] via SUBCUTANEOUS
  Administered 2021-08-22: 3 [IU] via SUBCUTANEOUS
  Administered 2021-08-23: 2 [IU] via SUBCUTANEOUS

## 2021-08-16 MED ORDER — INSULIN ASPART 100 UNIT/ML IJ SOLN
0.0000 [IU] | Freq: Three times a day (TID) | INTRAMUSCULAR | Status: DC
  Administered 2021-08-16: 12:00:00 3 [IU] via SUBCUTANEOUS
  Administered 2021-08-16: 17:00:00 2 [IU] via SUBCUTANEOUS
  Administered 2021-08-17: 17:00:00 5 [IU] via SUBCUTANEOUS
  Administered 2021-08-17: 08:00:00 11 [IU] via SUBCUTANEOUS
  Administered 2021-08-17: 5 [IU] via SUBCUTANEOUS
  Administered 2021-08-18: 8 [IU] via SUBCUTANEOUS
  Administered 2021-08-18: 2 [IU] via SUBCUTANEOUS
  Administered 2021-08-18: 3 [IU] via SUBCUTANEOUS
  Administered 2021-08-19 (×2): 5 [IU] via SUBCUTANEOUS
  Administered 2021-08-19: 11 [IU] via SUBCUTANEOUS
  Administered 2021-08-20: 09:00:00 3 [IU] via SUBCUTANEOUS
  Administered 2021-08-20 – 2021-08-21 (×3): 5 [IU] via SUBCUTANEOUS
  Administered 2021-08-22 (×2): 3 [IU] via SUBCUTANEOUS
  Administered 2021-08-22: 8 [IU] via SUBCUTANEOUS
  Administered 2021-08-23 (×2): 5 [IU] via SUBCUTANEOUS
  Administered 2021-08-24: 8 [IU] via SUBCUTANEOUS
  Administered 2021-08-24: 2 [IU] via SUBCUTANEOUS
  Administered 2021-08-24: 8 [IU] via SUBCUTANEOUS
  Administered 2021-08-25: 11 [IU] via SUBCUTANEOUS

## 2021-08-16 NOTE — Progress Notes (Signed)
PROGRESS NOTE    Katherine [name]  OAC:[id number] DOB: 1956/03/04 DOA: 08/04/2021 PCP: [name], MD  Chief Complaint  Patient presents with   generalized weakness    Brief Narrative:  This is a 65 year old female with anxiety, depression, CAD/CABG, chronic hypoxic respiratory failure related to COPD on 3 L of oxygen at home, type 2 diabetes mellitus, end-stage renal disease status post renal transplant, GERD, obesity, hypertension paroxysmal atrial fibrillation, PAD who presented to the hospital on 12/19 with shortness of breath and generalized weakness.  She was found to have a pulse ox of 84% on her usual oxygen, noted to be wheezing and had a cough with yellow sputum.  COVID PCR noted to be positive.  She's been admitted for COVID 19 pneumonia and now found to have MRSA pneumonia.  She's had progressively worsening O2 needs, now on heated high flow.    See below for additional details   Seen by psychiatry on 08/16/2021 due to concern for polypharmacy.  Ativan dose reduced to 12.5 mg twice daily as needed for anxiety.  Continue Zoloft 100 mg daily, appreciate psychiatry's assistance.  08/16/2021: Patient was seen and examined at bedside.  She has no new complaints.  On high flow nasal cannula with FiO2 50%, 35 L oxygen, oxygen saturation 97%.   Assessment & Plan:   Principal Problem:   Acute on chronic respiratory failure with hypoxia (HCC) Active Problems:   Hypomagnesemia   Dyslipidemia, goal LDL below 70   NSTEMI (non-ST elevated myocardial infarction) (HCC)   CAD S/P percutaneous coronary angioplasty - PCI x 5 to SVG-D1   Essential hypertension   PAD (peripheral artery disease) (HCC)   Paroxysmal atrial fibrillation (HCC); CHA2DS2VASc score F, HTN, CAD, CVA = 5   OSA (obstructive sleep apnea)   GAD (generalized anxiety disorder)   Acute CHF (congestive heart failure) ([place])   History of renal transplant   Uncontrolled type 2 diabetes mellitus with hyperglycemia,  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  Possible contribution from HF. Uses 2 LPM at home COVID test + on 12/18 CXR 12/24 with worsening severe diffuse bilateral airspace disease CT PE protocol chest 08/05/2021: limited for PE, but negative, extensive heterogenous and GGO throughout lungs  Satting in 90's, on 25 L HHFNC (slowly improving) Elevated BNP -> lasix as tolerated Completed remdesivir.  Continue IV steroids for covid and abx to cover for MRSA pneumonia She's immunosuppressed on antirejection meds with renal transplant (not candidate for baricitinib or actemra with MRSA infection and immunosuppression).  Poor prognosis with immunosuppression and covid.  COVID-19 Labs  Recent Labs    08/10/21 0515 08/11/21 0309  DDIMER 2.01* 1.43*  CRP  --  2.4*    COVID-19 Labs  Recent Labs    08/10/21 0515 08/11/21 0309  DDIMER 2.01* 1.43*  CRP  --  2.4*    Lab Results  Component Value Date   SARSCOV2NAA POSITIVE (A) 08/04/2021   SARSCOV2NAA NEGATIVE 05/12/2021   [place] NEGATIVE 04/19/2021   [place] NEGATIVE 03/30/2021     Positive D dimer On anticoagulation Follow LE [place] - negative  MRSA pneumonia ([place]) 12/20 sputum  cx with MRSA (and candida albicans - suspect this is contaminant/colonizer)  On doxycycline, plan for 7 days (12/23 - present) As above  Chronic diastolic CHF (congestive heart failure) ([place])- (present on admission) Grade 1 diastolic dysfunction  Obesity (BMI 30-39.9)- (present on admission) Body mass index is 37.03 kg/m.  Placing the patient at high risk of poor outcome.  Monitor.  COVID-19 virus  infection- (present on admission) As above Steroids, remdesivir (s/p 5 doses) Tapering steroids  Pressure injury of skin- (present on admission) Stage II, medial rectal ulcer. Present on admission. 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 Receives prednisone and Imuran. Placing the patient at high risk for poor outcome secondary to chronic immunosuppressive status  Acute CHF (congestive heart failure) (HCC) Elevated BNP Follow echo - EF 45-50%, RWMA (see report) Lasix as tolerated, follow renal function Strict I/O, daily weights - net negative  GAD (generalized anxiety disorder)- (present on admission) Continue Zoloft and Ativan.  OSA (obstructive sleep apnea)- (present on admission) Does not use CPAP at night after lap band.  Paroxysmal atrial fibrillation (HCC); CHA2DS2VASc score F, HTN, CAD, CVA = 5- (present on admission) Continue Metoprolol (dose reduced due to brady) and Eliquis -> now on heparin gtt  PAD (peripheral artery disease) ([place])- (present on admission) Prior history.  Monitor.  Essential hypertension Recently improved, follow Continue home BP meds, consider adding meds pending trend  CAD S/P percutaneous coronary angioplasty - PCI x 5 to SVG-D1 Prior history.  Monitor. W/u for chest pain above  NSTEMI (non-ST elevated myocardial infarction) ([place]) Episode of CP similar to prior heart attacks on 12/24 Continue nitro prn  Was already anticoagulated on eliquis, antiplatelet on plavix -> given concerning symptoms/rising troponin -> transitioned to heparin gtt, aspirin added to placix EF 45-50%, RWMA's (see report) Cardiology c/s, appreciate assistance -> plan for  aspirin, plavix, imdur (increased dose), lopressor, crestor - consider arb/arni if BP remains high, consider cath pending clinical status (note 12/25) Would want her O2 needs to improve prior to diagnostic cath - still has significant O2 needs Ok to work with PT/OT if asymptomatic  Dyslipidemia, goal LDL below 70- (present on admission) Continue statin.  Hypomagnesemia- (present on admission) Replaced.  Resolved hyponatremia, serum sodium 135. Resolved Hypomagnesemia, serum mag 2.2. Hyperglycemia is improving.  Hypercarbia is improving  Persistent Generalized weakness PT OT assessed and recommended home health PT OT Continue fall precautions Patient encouraged to participate with therapies  MRSA pneumonia Personally reviewed last chest x-ray showing improvement of infiltrates. Completed course of IV doxycycline on 08/15/2021. Repeated procalcitonin level less than 0.10   Resolved post repletion: Hypokalemia K+ 3.3>> 4.1. Kcl 40 meq x2 replacement  Chronic anxiety/depression with concern for polypharmacy Seen by psychiatry, appreciate recommendations.   DVT prophylaxis: Heparin gtt (previously eliquis) Code Status: Full Family Communication: none at bedside - discussed with niece 12/24, no answer 12/25 Disposition:   Status is: Inpatient  Remains inpatient appropriate because: sig O2 requirement, NSTEMI       Consultants:  cardiology  Procedures:  echo IMPRESSIONS     1. Left ventricular ejection fraction, by estimation, is 45 to 50%. The  left ventricle has mildly decreased function. The left ventricle  demonstrates regional wall motion abnormalities (see scoring  diagram/findings for description).  There is mild left  ventricular hypertrophy. Left ventricular diastolic function could not be  evaluated.   2. Right ventricular systolic function is normal. The right ventricular  size is normal.   3. The mitral valve is grossly normal. Trivial mitral valve   regurgitation.   4. The aortic valve has an indeterminant number of cusps. There is  moderate calcification of the aortic valve. Aortic valve regurgitation is  not visualized. Aortic valve sclerosis/calcification is present, without  any evidence of aortic stenosis.   Comparison(s): Prior images reviewed side by side. Wall motion more  similar to last study with Definity contrast on 07/12/2020, although LVEF  somewhat better.   Summary:  BILATERAL:  -No evidence of popliteal cyst, bilaterally.      Antimicrobials:  Anti-infectives (From admission, onward)    Start     Dose/Rate Route Frequency Ordered Stop   08/08/21 1400  doxycycline (VIBRAMYCIN) 100 mg in sodium chloride 0.9 % 250 mL IVPB        100 mg 125 mL/hr over 120 Minutes Intravenous Every 12 hours 08/08/21 1327 08/15/21 1046   08/06/21 1000  remdesivir 100 mg in sodium chloride 0.9 % 100 mL IVPB  Status:  Discontinued       See Hyperspace for full Linked Orders Report.   100 mg 200 mL/hr over 30 Minutes Intravenous Daily 08/05/21 0720 08/05/21 0720   08/06/21 1000  remdesivir 100 mg in sodium chloride 0.9 % 100 mL IVPB        100 mg 200 mL/hr over 30 Minutes Intravenous Daily 08/05/21 0721 08/09/21 0937   08/05/21 1000  remdesivir 100 mg in sodium chloride 0.9 % 100 mL IVPB  Status:  Discontinued       See Hyperspace for full Linked Orders Report.   100 mg 200 mL/hr over 30 Minutes Intravenous Daily 08/04/21 0819 08/05/21 0720   08/05/21 1000  remdesivir 200 mg in sodium chloride 0.9% 250 mL IVPB        200 mg 580 mL/hr over 30 Minutes Intravenous Once 08/05/21 0719 08/05/21 1150   08/05/21 1000  cefTRIAXone (ROCEPHIN) 2 g in sodium chloride 0.9 % 100 mL IVPB        2 g 200 mL/hr over 30 Minutes Intravenous Every 24 hours 08/05/21 0815 08/09/21 1032   08/05/21 1000  azithromycin (ZITHROMAX) 500 mg in sodium chloride 0.9 % 250 mL IVPB  Status:  Discontinued        500 mg 250 mL/hr over 60 Minutes Intravenous Every  24 hours 08/05/21 0815 08/05/21 0907   08/04/21 0930  remdesivir 200 mg in sodium chloride 0.9% 250 mL IVPB  Status:  Discontinued       See Hyperspace for full Linked Orders Report.   200 mg 580 mL/hr over 30 Minutes Intravenous Once 08/04/21 0819 08/05/21 0720        Objective: Vitals:   08/16/21 0747 08/16/21 1100 08/16/21 1403 08/16/21 1500  BP:      Pulse:   (!) 108   Resp:   (!) 22   Temp: 98.7 F (37.1 C) 98.4 F (36.9 C)  98.4 F (36.9 C)  TempSrc: Axillary Axillary  Axillary  SpO2: 91%  97%   Weight:      Height:        Intake/Output Summary (Last 24 hours) at 08/16/2021 1609 Last data filed at 08/16/2021 1558 Gross per 24 hour  Intake 179.91 ml  Output 2550 ml  Net -2370.09 ml   Danley Danker  Weights   08/14/21 0500 08/15/21 0441 08/16/21 0407  Weight: 88 kg 86.9 kg 86.9 kg    Examination:  General: Frail-appearing no acute distress.  She is alert oriented x3.   Cardiovascular: Regular rate and rhythm no rubs or gallops.   Lungs: Mild rales at bases no wheezing noted.  Poor inspiratory effort. Abdomen: Soft nontender normal bowel sounds present. Neurological: Nonfocal exam. Skin: No rashes or ulcerative lesions noted. Extremities: No lower extremity edema bilaterally.  Data Reviewed: I have personally reviewed following labs and imaging studies  CBC: Recent Labs  Lab 08/10/21 0515 08/11/21 0309 08/12/21 0312 08/14/21 0410 08/15/21 0440 08/16/21 0431  WBC 9.2 8.4 8.0 9.5 12.1* 12.8*  NEUTROABS 8.4* 7.9* 7.3  --   --   --   HGB 10.6* 11.6* 11.1* 12.5 13.2 13.4  HCT 32.0* 35.0* 33.1* 36.8 39.9 41.0  MCV 95.5 96.2 93.5 94.6 94.8 94.3  PLT 315 332 318 311 308 597    Basic Metabolic Panel: Recent Labs  Lab 08/10/21 0515 08/11/21 0309 08/12/21 0312 08/13/21 0410 08/15/21 0440 08/16/21 0600  NA 138 137 133* 131* 135 132*  K 3.4* 4.3 4.1 3.6 3.3* 4.1  CL 99 98 96* 92* 94* 94*  CO2 31 30 28 29 31 29   GLUCOSE 119* 211* 249* 189* 73 128*  BUN 38*  41* 44* 47* 58* 62*  CREATININE 0.77 0.87 0.93 0.89 0.92 0.82  CALCIUM 8.4* 8.7* 8.8* 9.1 9.4 9.0  MG 2.1 2.2 1.9 1.9 2.2  --   PHOS 3.7 3.1 3.1 3.5 4.3  --     GFR: Estimated Creatinine Clearance: 69.4 mL/min (by C-G formula based on SCr of 0.82 mg/dL).  Liver Function Tests: Recent Labs  Lab 08/10/21 0515 08/11/21 0309 08/12/21 0312 08/13/21 0410 08/15/21 0440  AST 27 23 18 16 19   ALT 11 13 14 15 16   ALKPHOS 99 115 129* 138* 138*  BILITOT 0.5 0.6 0.8 0.7 0.7  PROT 5.9* 6.4* 6.1* 6.1* 6.1*  ALBUMIN 2.3* 2.6* 2.4* 2.4* 2.4*    CBG: Recent Labs  Lab 08/15/21 1140 08/15/21 1819 08/15/21 2205 08/16/21 0757 08/16/21 1134  GLUCAP 186* 251* 360* 98 154*     No results found for this or any previous visit (from the past 240 hour(s)).        Radiology Studies: DG CHEST PORT 1 VIEW  Result Date: 08/14/2021 CLINICAL DATA:  COVID positive. EXAM: PORTABLE CHEST 1 VIEW COMPARISON:  08/12/2021 FINDINGS: Postoperative changes in the mediastinum. Mild cardiac enlargement. Patchy perihilar infiltrates in both lungs may represent edema or multifocal pneumonia. No pleural effusions. No pneumothorax. Mediastinal contours appear intact. IMPRESSION: Cardiac enlargement with patchy perihilar infiltrates in both lungs, possibly edema or multifocal pneumonia. Electronically Signed   By: Lucienne Capers M.D.   On: 08/14/2021 19:02        Scheduled Meds:  (feeding supplement) PROSource Plus  30 mL Oral BID BM   arformoterol  15 mcg Nebulization BID   vitamin C  500 mg Oral Daily   aspirin  81 mg Oral Daily   budesonide  2 mL Nebulization BID   calcitRIOL  0.25 mcg Oral Q3 days   Chlorhexidine Gluconate Cloth  6 each Topical Daily   clopidogrel  75 mg Oral Daily   feeding supplement (GLUCERNA SHAKE)  237 mL Oral TID BM   fluticasone  2 spray Each Nare Daily   furosemide  40 mg Intravenous BID   guaiFENesin-dextromethorphan  10 mL Oral QID  insulin aspart  0-15 Units  Subcutaneous TID WC   insulin aspart  0-5 Units Subcutaneous QHS   insulin aspart  5 Units Subcutaneous TID WC   insulin glargine-yfgn  10 Units Subcutaneous Daily   isosorbide mononitrate  90 mg Oral Daily   lamoTRIgine  25 mg Oral QHS   linagliptin  5 mg Oral Daily   loratadine  10 mg Oral Daily   magic mouthwash  5 mL Oral QID   mouth rinse  15 mL Mouth Rinse BID   metoprolol tartrate  25 mg Oral BID   montelukast  10 mg Oral QHS   multivitamin with minerals  1 tablet Oral Daily   pantoprazole  40 mg Oral Daily   [START ON 08/17/2021] predniSONE  30 mg Oral Q breakfast   Followed by   Derrill Memo ON 08/19/2021] predniSONE  10 mg Oral Q breakfast   Followed by   Derrill Memo ON 08/21/2021] predniSONE  10 mg Oral Q breakfast   Ensure Max Protein  11 oz Oral Daily   rosuvastatin  20 mg Oral Daily   senna-docusate  2 tablet Oral BID   sertraline  100 mg Oral Daily   sodium chloride flush  10-40 mL Intracatheter Q12H   zinc sulfate  220 mg Oral Daily   Continuous Infusions:  heparin 700 Units/hr (08/16/21 1351)     LOS: 12 days    Time spent: over 30 min 40 min critical care time with AHRF 2/2 covid and mrsa pneumonia on HHFNC  Kayleen Memos, MD [facility]   To contact the attending provider between 7A-7P or the covering provider during after hours 7P-7A, please log into the web site [email or web address] and access using universal [place] password for that web site. If you do not have the password, please call the hospital operator.  08/16/2021, 4:09 PM

## 2021-08-16 NOTE — Progress Notes (Signed)
Progress Note  Patient Name: Katherine [name] Date of Encounter: 08/16/2021  [facility] Cardiologist: [name], MD   Subjective   No chest pain. Troponin peaked at over 4000 and trended down to 351. Suspect she had graft failure in setting of COVID pneumonia. Medical therapy is recommended for the time being. Maintaining sinus rhythm but history of PAF. Weaned down to 3L O2 by nasal cannula.  Inpatient Medications    Scheduled Meds:  (feeding supplement) PROSource Plus  30 mL Oral BID BM   arformoterol  15 mcg Nebulization BID   vitamin C  500 mg Oral Daily   aspirin  81 mg Oral Daily   budesonide  2 mL Nebulization BID   calcitRIOL  0.25 mcg Oral Q3 days   Chlorhexidine Gluconate Cloth  6 each Topical Daily   clopidogrel  75 mg Oral Daily   feeding supplement (GLUCERNA SHAKE)  237 mL Oral TID BM   fluticasone  2 spray Each Nare Daily   furosemide  40 mg Intravenous BID   guaiFENesin-dextromethorphan  10 mL Oral QID   insulin aspart  0-15 Units Subcutaneous TID WC   insulin aspart  0-5 Units Subcutaneous QHS   insulin aspart  5 Units Subcutaneous TID WC   insulin glargine-yfgn  10 Units Subcutaneous Daily   isosorbide mononitrate  90 mg Oral Daily   lamoTRIgine  25 mg Oral QHS   linagliptin  5 mg Oral Daily   loratadine  10 mg Oral Daily   magic mouthwash  5 mL Oral QID   mouth rinse  15 mL Mouth Rinse BID   metoprolol tartrate  25 mg Oral BID   montelukast  10 mg Oral QHS   multivitamin with minerals  1 tablet Oral Daily   pantoprazole  40 mg Oral Daily   predniSONE  40 mg Oral Q breakfast   Followed by   Derrill Memo ON 08/17/2021] predniSONE  30 mg Oral Q breakfast   Followed by   Derrill Memo ON 08/19/2021] predniSONE  10 mg Oral Q breakfast   Followed by   Derrill Memo ON 08/21/2021] predniSONE  10 mg Oral Q breakfast   Ensure Max Protein  11 oz Oral Daily   rosuvastatin  20 mg Oral Daily   senna-docusate  2 tablet Oral BID   sertraline  100 mg Oral Daily   sodium  chloride flush  10-40 mL Intracatheter Q12H   zinc sulfate  220 mg Oral Daily   Continuous Infusions:  heparin 700 Units/hr (08/16/21 0200)   PRN Meds: acetaminophen **OR** acetaminophen, cyclobenzaprine, hydrALAZINE, ipratropium-albuterol, LORazepam, menthol-cetylpyridinium, morphine injection, nitroGLYCERIN, ondansetron (ZOFRAN) IV, oxyCODONE, promethazine, sodium chloride, sodium chloride flush   Vital Signs    Vitals:   08/16/21 0500 08/16/21 0600 08/16/21 0700 08/16/21 0747  BP: 114/80 127/84 (!) 107/92   Pulse: 90 95 98   Resp: 16 20 (!) 24   Temp:      TempSrc:      SpO2: 92% 95% 98% 91%  Weight:      Height:        Intake/Output Summary (Last 24 hours) at 08/16/2021 0754 Last data filed at 08/16/2021 0400 Gross per 24 hour  Intake 159.61 ml  Output 1300 ml  Net -1140.39 ml   Last 3 Weights 08/16/2021 08/15/2021 08/14/2021  Weight (lbs) 191 lb 9.3 oz 191 lb 9.3 oz 194 lb 0.1 oz  Weight (kg) 86.9 kg 86.9 kg 88 kg      Telemetry  Sinus rhythm  - Personally Reviewed  ECG    No new - Personally Reviewed  Physical Exam   Physical Exam: Blood pressure (!) 107/92, pulse 98, temperature 98.2 F (36.8 C), temperature source Axillary, resp. rate (!) 24, height 5\' 1"  (1.549 m), weight 86.9 kg, SpO2 91 %.  GEN:  middle age female,  appears older than stated age  32: Normal NECK: No JVD; No carotid bruits LYMPHATICS: No lymphadenopathy CARDIAC: RRR   RESPIRATORY:   rales, wheezes bilat.  Seems to be improving ABDOMEN: Soft, non-tender, non-distended MUSCULOSKELETAL:  No edema; No deformity  SKIN: Warm and dry NEUROLOGIC:  Alert and oriented x 3   Labs    High Sensitivity Troponin:   Recent Labs  Lab 08/11/21 0539 08/11/21 1040 08/11/21 1431 08/12/21 0311 08/14/21 1845  TROPONINIHS 1,660* 1,171* 877* 926* 351*     Chemistry Recent Labs  Lab 08/12/21 0312 08/13/21 0410 08/15/21 0440  NA 133* 131* 135  K 4.1 3.6 3.3*  CL 96* 92* 94*  CO2  28 29 31   GLUCOSE 249* 189* 73  BUN 44* 47* 58*  CREATININE 0.93 0.89 0.92  CALCIUM 8.8* 9.1 9.4  MG 1.9 1.9 2.2  PROT 6.1* 6.1* 6.1*  ALBUMIN 2.4* 2.4* 2.4*  AST 18 16 19   ALT 14 15 16   ALKPHOS 129* 138* 138*  BILITOT 0.8 0.7 0.7  GFRNONAA >60 >60 >60  ANIONGAP 9 10 10     Lipids No results for input(s): CHOL, TRIG, HDL, LABVLDL, LDLCALC, CHOLHDL in the last 168 hours.  Hematology Recent Labs  Lab 08/14/21 0410 08/15/21 0440 08/16/21 0431  WBC 9.5 12.1* 12.8*  RBC 3.89 4.21 4.35  HGB 12.5 13.2 13.4  HCT 36.8 39.9 41.0  MCV 94.6 94.8 94.3  MCH 32.1 31.4 30.8  MCHC 34.0 33.1 32.7  RDW 15.0 15.1 15.0  PLT 311 308 315   Thyroid No results for input(s): TSH, FREET4 in the last 168 hours.  BNP Recent Labs  Lab 08/11/21 0309 08/12/21 0312 08/13/21 0457  BNP 202.5* 194.7* 331.3*    DDimer  Recent Labs  Lab 08/10/21 0515 08/11/21 0309 08/13/21 0410  DDIMER 2.01* 1.43* 0.93*     Radiology    DG CHEST PORT 1 VIEW  Result Date: 08/14/2021 CLINICAL DATA:  COVID positive. EXAM: PORTABLE CHEST 1 VIEW COMPARISON:  08/12/2021 FINDINGS: Postoperative changes in the mediastinum. Mild cardiac enlargement. Patchy perihilar infiltrates in both lungs may represent edema or multifocal pneumonia. No pleural effusions. No pneumothorax. Mediastinal contours appear intact. IMPRESSION: Cardiac enlargement with patchy perihilar infiltrates in both lungs, possibly edema or multifocal pneumonia. Electronically Signed   By: Lucienne Capers M.D.   On: 08/14/2021 19:02    Cardiac Studies   Echo 08/09/21  IMPRESSIONS     1. Left ventricular ejection fraction, by estimation, is 45 to 50%. The  left ventricle has mildly decreased function. The left ventricle  demonstrates regional wall motion abnormalities (see scoring  diagram/findings for description). There is mild left  ventricular hypertrophy. Left ventricular diastolic function could not be  evaluated.   2. Right ventricular  systolic function is normal. The right ventricular  size is normal.   3. The mitral valve is grossly normal. Trivial mitral valve  regurgitation.   4. The aortic valve has an indeterminant number of cusps. There is  moderate calcification of the aortic valve. Aortic valve regurgitation is  not visualized. Aortic valve sclerosis/calcification is present, without  any evidence of aortic stenosis.  Comparison(s): Prior images reviewed side by side. Wall motion more  similar to last study with Definity contrast on 07/12/2020, although LVEF  somewhat better.   FINDINGS   Left Ventricle: Left ventricular ejection fraction, by estimation, is 45  to 50%. The left ventricle has mildly decreased function. The left  ventricle demonstrates regional wall motion abnormalities. Definity  contrast agent was given IV to delineate the  left ventricular endocardial borders. The left ventricular internal cavity  size was normal in size. There is mild left ventricular hypertrophy. Left  ventricular diastolic function could not be evaluated.      LV Wall Scoring:  The apex is dyskinetic. The apical septal segment is hypokinetic. The  entire  anterior wall, entire lateral wall, anterior septum, entire inferior wall,  mid inferoseptal segment, and basal inferoseptal segment are normal.   Right Ventricle: The right ventricular size is normal. No increase in  right ventricular wall thickness. Right ventricular systolic function is  normal.   Left Atrium: Left atrial size was normal in size.   Right Atrium: Right atrial size was normal in size.   Pericardium: There is no evidence of pericardial effusion.   Mitral Valve: The mitral valve is grossly normal. Mild mitral annular  calcification. Trivial mitral valve regurgitation.   Tricuspid Valve: The tricuspid valve is grossly normal. Tricuspid valve  regurgitation is mild.   Aortic Valve: The aortic valve has an indeterminant number of cusps. There   is moderate calcification of the aortic valve. Aortic valve regurgitation  is not visualized. Aortic valve sclerosis/calcification is present,  without any evidence of aortic  stenosis.   Pulmonic Valve: The pulmonic valve was grossly normal. Pulmonic valve  regurgitation is trivial.   Aorta: The aortic root is normal in size and structure.   IAS/Shunts: The interatrial septum was not well visualized.      LEFT VENTRICLE  PLAX 2D  LVIDd:         4.40 cm  LVIDs:         3.30 cm  LV PW:         1.00 cm  LV IVS:        1.10 cm  LVOT diam:     2.00 cm  LVOT Area:     3.14 cm      Patient Profile     65 y.o. 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 Failure with COVID-19 and MRSA PNA seen by cardiology for chest pain and abnormal troponins.    Assessment & Plan    1.  NSTEMI with recent recurrent angina symptoms in the setting of acute illness.  High-sensitivity troponin I up to 4125.  ECG shows inferior and anterolateral T wave inversions.  On ASA, plavix, lopressor, imdur, crestor and IV heparin on heparin and off eliquis since 12/24.   Ultimately consider a follow up diagnostic cath depending on course, her 02 requirements are still elevated - suspect this will be the middle of next week (January 2/3).   2.  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 See above. Probably had graft loss - may not be much to intervene on at this point since she is not having angina.   3.  Component of acute on chronic systolic heart failure (echo  this admit EF 45-50% in 01/2021 EF was 55-60%). Has diuresed 9 liters so far this admission    4.  Paroxysmal atrial fibrillation with CHA2DS2-VASc score of 6.  Transition from Eliquis to heparin in the setting of NSTEMI.  She is in sinus rhythm at  this time.   5.  Acute on chronic hypoxic respiratory failure with COPD at baseline with home 02, currently with COVID-19 and MRSA pneumonia. She has completed remdesivir and is on IV steroids and doxycycline.  Oxygen requirement has improved.   6.  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      For questions or updates, please contact Kell Please consult [email or web address] for contact info under   Pixie Casino, MD, FACC, [place] Director of the [facility] Diplomate of the American Board of Clinical Lipidology Attending Cardiologist  Direct Dial: [phone]   Fax: [phone]  Website:  [email or web address]

## 2021-08-16 NOTE — Consult Note (Signed)
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 of 0.5 g/mL FEU, this assay has a negative predictive value of 95-100%.This assay is intended for use in conjunction with a clinical pretest probability (PTP) assessment model to exclude pulmonary embolism (PE) and deep venous thrombosis (DVT) in outpatients suspected of PE or DVT. Results should be correlated with clinical presentation. Performed at [facility], [place] [address], [place], [place] [address]   C-reactive protein     Status: Abnormal   Collection Time: 08/05/21  2:52 AM  Result Value Ref Range   CRP 18.6 (H) <1.0 mg/dL    Comment: Performed at [facility] [address], [place], [place] [address]  Comprehensive metabolic panel     Status: Abnormal   Collection Time: 08/05/21  2:52 AM  Result Value Ref Range   Sodium 132 (L) 135 - 145 mmol/L   Potassium 4.1 3.5 - 5.1 mmol/L   Chloride 101 98 - 111 mmol/L   CO2 23 22 - 32 mmol/L   Glucose, Bld 176 (H) 70 - 99 mg/dL    Comment: Glucose reference range applies only to samples taken after fasting for at least 8 hours.   BUN 15 8 - 23 mg/dL   Creatinine, Ser 0.77 0.44 - 1.00 mg/dL   Calcium 8.5 (L) 8.9 - 10.3 mg/dL   Total Protein 6.8 6.5 - 8.1 g/dL   Albumin 2.8 (L) 3.5 - 5.0 g/dL   AST 35 15 - 41 U/L   ALT 9 0 - 44 U/L   Alkaline Phosphatase 74 38 - 126 U/L   Total Bilirubin 1.1 0.3 - 1.2 mg/dL   GFR, Estimated >60 >60 mL/min    Comment: (NOTE) Calculated using the CKD-EPI Creatinine Equation (2021)    Anion gap 8 5 - 15    Comment: Performed at [facility], [place] [address] [place], [place] [address]  CBC with  Differential/Platelet     Status: Abnormal   Collection Time: 08/05/21  2:52 AM  Result Value Ref Range   WBC 5.0 4.0 - 10.5 K/uL   RBC 3.56 (L) 3.87 - 5.11 MIL/uL   Hemoglobin 11.3 (L) 12.0 - 15.0 g/dL   HCT 34.6 (L) 36.0 - 46.0 %   MCV 97.2 80.0 - 100.0 fL   MCH 31.7 26.0 - 34.0 pg   MCHC 32.7 30.0 - 36.0 g/dL   RDW 15.8 (H) 11.5 - 15.5 %   Platelets 177 150 - 400 K/uL   nRBC 0.0 0.0 - 0.2 %   Neutrophils Relative % 89 %   Neutro Abs 4.4 1.7 - 7.7 K/uL   Lymphocytes Relative 5 %   Lymphs Abs 0.3 (L) 0.7 - 4.0 K/uL   Monocytes Relative 5 %   Monocytes Absolute 0.3 0.1 - 1.0 K/uL   Eosinophils Relative 0 %   Eosinophils Absolute 0.0 0.0 - 0.5 K/uL   Basophils Relative 0 %   Basophils Absolute 0.0 0.0 - 0.1 K/uL    Immature Granulocytes 1 %   Abs Immature Granulocytes 0.03 0.00 - 0.07 K/uL    Comment: Performed at [facility], [place] [address], [place], [place] [address]  Procalcitonin - Baseline     Status: None   Collection Time: 08/05/21  2:52 AM  Result Value Ref Range   Procalcitonin 0.40 ng/mL    Comment:        Interpretation: PCT (Procalcitonin) <= 0.5 ng/mL: Systemic infection (sepsis) is not likely. Local bacterial infection is possible. (NOTE)       Sepsis PCT Algorithm           Lower Respiratory Tract                                      Infection PCT Algorithm    ----------------------------     ----------------------------         PCT < 0.25 ng/mL                PCT < 0.10 ng/mL          Strongly encourage             Strongly discourage   discontinuation of antibiotics    initiation of antibiotics    ----------------------------     -----------------------------       PCT 0.25 - 0.50 ng/mL            PCT 0.10 - 0.25 ng/mL               OR       >80% decrease in PCT            Discourage initiation of                                            antibiotics      Encourage discontinuation           of antibiotics    ----------------------------     -----------------------------         PCT >= 0.50 ng/mL              PCT 0.26 - 0.50 ng/mL  AND        <80% decrease in PCT             Encourage initiation of                                             antibiotics       Encourage continuation           of antibiotics    ----------------------------     -----------------------------        PCT >= 0.50 ng/mL                  PCT > 0.50 ng/mL               AND         increase in PCT                  Strongly encourage                                      initiation of antibiotics    Strongly encourage escalation           of antibiotics                                     -----------------------------                                           PCT <=  0.25 ng/mL                                                 OR                                        > 80% decrease in PCT                                      Discontinue / Do not initiate                                             antibiotics  Performed at [facility] [address] [place], [place] [address]   Glucose, capillary     Status: Abnormal   Collection Time: 08/05/21  4:11 AM  Result Value Ref Range   Glucose-Capillary 169 (H) 70 - 99 mg/dL    Comment: Glucose reference range applies only to samples taken after fasting for at least 8 hours.  Glucose, capillary     Status: Abnormal   Collection Time: 08/05/21  8:07 AM  Result Value Ref Range   Glucose-Capillary 169 (H) 70 - 99 mg/dL    Comment:  Glucose reference range applies only to samples taken after fasting for at least 8 hours.  Culture, blood (routine x 2) Call MD if unable to obtain prior to antibiotics being given     Status: None   Collection Time: 08/05/21 10:12 AM   Specimen: BLOOD LEFT HAND  Result Value Ref Range   Specimen Description      BLOOD LEFT HAND Performed at [facility] [address], [place], [place] [address]    Special Requests      BOTTLES DRAWN AEROBIC ONLY Blood Culture results may not be optimal due to an inadequate volume of blood received in culture bottles Performed at [facility] [address], [place], [place] [address]    Culture      NO GROWTH 5 DAYS Performed at [facility], [place] [address], [place], [place] [address]    Report Status 08/10/2021 FINAL   Legionella Urine Antigen     Status: None   Collection Time: 08/05/21 10:23 AM  Result Value Ref Range   L. pneumophila Serogp 1 Ur Ag Negative Negative    Comment: (NOTE) Presumptive negative for L. pneumophila serogroup 1 antigen in urine, suggesting no recent or current infection. Legionnaires' disease cannot be ruled out since other serogroups and  species may also cause disease. Performed At: [facility] [place], [place] [id number] Rush Farmer MD MC:[id number]    Source of Sample URINE, CLEAN CATCH     Comment: Performed at [facility] [address] [place], [place] [address]  Strep pneumoniae urinary antigen     Status: None   Collection Time: 08/05/21 10:23 AM  Result Value Ref Range   Strep Pneumo Urinary Antigen NEGATIVE NEGATIVE    Comment:        Infection due to S. pneumoniae cannot be absolutely ruled out since the antigen present may be below the detection limit of the test. Performed at [facility], [place] [address], [place], [place] [address]   Expectorated Sputum Assessment w Gram Stain, Rflx to Resp Cult     Status: None   Collection Time: 08/05/21 10:31 AM   Specimen: Expectorated Sputum  Result Value Ref Range   Specimen Description EXPECTORATED SPUTUM    Special Requests NONE    Sputum evaluation      Sputum specimen not acceptable for testing.  Please recollect.   RESULT CALLED TO, READ BACK BY AND VERIFIED WITH: NOTIFY Joesphine Bare RN ON 07/2021 @ 1104 BY MECIAL J. Performed at [facility], [place] [address], [place], [place] [address]    Report Status 08/05/2021 FINAL   Culture, blood (routine x 2) Call MD if unable to obtain prior to antibiotics being given     Status: None   Collection Time: 08/05/21 11:07 AM   Specimen: BLOOD RIGHT HAND  Result Value Ref Range   Specimen Description      BLOOD RIGHT HAND Performed at [facility] [address], [place], [place] [address]    Special Requests      BOTTLES DRAWN AEROBIC ONLY Blood Culture results may not be optimal due to an inadequate volume of blood received in culture bottles Performed at [facility] [address] [place], [place] [address]    Culture      NO GROWTH 5 DAYS Performed at [facility], [place] [address],  [place],  [address]    Report  Status 08/10/2021 FINAL   Glucose, capillary     Status: Abnormal   Collection Time: 08/05/21 11:23 AM  Result Value Ref Range   Glucose-Capillary 235 (H) 70 - 99 mg/dL    Comment: Glucose reference range applies only to samples taken after fasting for at least 8 hours.  Expectorated Sputum Assessment w Gram Stain, Rflx to Resp Cult     Status: None   Collection Time: 08/05/21  1:29 PM   Specimen: SPU  Result Value Ref Range   Specimen Description SPUTUM    Special Requests NONE    Sputum evaluation      THIS SPECIMEN IS ACCEPTABLE FOR SPUTUM CULTURE Performed at [facility], [place] [address] [place], [place] [address]    Report Status 08/05/2021 FINAL   Culture, Respiratory w Gram Stain     Status: None   Collection Time: 08/05/21  1:29 PM   Specimen: SPU  Result Value Ref Range   Specimen Description      SPUTUM Performed at [facility], [place] [address], [place], [place] [address]    Special Requests      NONE Reflexed from [id number] Performed at [facility], [place] [address], [place], [place] [address]    Gram Stain      ABUNDANT WBC PRESENT, PREDOMINANTLY MONONUCLEAR MODERATE YEAST FEW GRAM POSITIVE RODS FEW GRAM POSITIVE COCCI Performed at [facility], [place] [address], [place], [place] [address]    Culture      MODERATE METHICILLIN RESISTANT STAPHYLOCOCCUS AUREUS FEW CANDIDA ALBICANS    Report Status 08/08/2021 FINAL    Organism ID, Bacteria METHICILLIN RESISTANT STAPHYLOCOCCUS AUREUS       Susceptibility   Methicillin resistant staphylococcus aureus - MIC*    CIPROFLOXACIN >=8 RESISTANT Resistant     ERYTHROMYCIN >=8 RESISTANT Resistant     GENTAMICIN <=0.5 SENSITIVE Sensitive     OXACILLIN >=4 RESISTANT Resistant     TETRACYCLINE <=1 SENSITIVE Sensitive     VANCOMYCIN <=0.5 SENSITIVE Sensitive     TRIMETH/SULFA <=10 SENSITIVE Sensitive     CLINDAMYCIN >=8 RESISTANT  Resistant     RIFAMPIN <=0.5 SENSITIVE Sensitive     Inducible Clindamycin NEGATIVE Sensitive     * MODERATE METHICILLIN RESISTANT STAPHYLOCOCCUS AUREUS  Glucose, capillary     Status: Abnormal   Collection Time: 08/05/21  5:48 PM  Result Value Ref Range   Glucose-Capillary 434 (H) 70 - 99 mg/dL    Comment: Glucose reference range applies only to samples taken after fasting for at least 8 hours.  Glucose, capillary     Status: Abnormal   Collection Time: 08/05/21  5:49 PM  Result Value Ref Range   Glucose-Capillary 439 (H) 70 - 99 mg/dL    Comment: Glucose reference range applies only to samples taken after fasting for at least 8 hours.  Glucose, capillary     Status: Abnormal   Collection Time: 08/05/21  9:23 PM  Result Value Ref Range   Glucose-Capillary 346 (H) 70 - 99 mg/dL    Comment: Glucose reference range applies only to samples taken after fasting for at least 8 hours.  HIV Antibody (routine testing w rflx)     Status: None   Collection Time: 08/06/21  2:52 AM  Result Value Ref Range   HIV Screen 4th Generation wRfx Non Reactive Non Reactive    Comment: Performed at [facility], [place] [address] [place], [place] [address]  Magnesium  Status: None   Collection Time: 08/06/21  2:52 AM  Result Value Ref Range   Magnesium 1.9 1.7 - 2.4 mg/dL    Comment: Performed at [facility], [place] [address], [place], [place] [address]  D-dimer, quantitative     Status: Abnormal   Collection Time: 08/06/21  2:52 AM  Result Value Ref Range   D-Dimer, Quant 1.69 (H) 0.00 - 0.50 ug/mL-FEU    Comment: (NOTE) At the manufacturer cut-off value of 0.5 g/mL FEU, this assay has a negative predictive value of 95-100%.This assay is intended for use in conjunction with a clinical pretest probability (PTP) assessment model to exclude pulmonary embolism (PE) and deep venous thrombosis (DVT) in outpatients suspected of PE or DVT. Results should be correlated with clinical  presentation. Performed at [facility], [place] [address], [place], [place] [address]   C-reactive protein     Status: Abnormal   Collection Time: 08/06/21  2:52 AM  Result Value Ref Range   CRP 9.0 (H) <1.0 mg/dL    Comment: Performed at [facility] [address], [place], [place] [address]  Comprehensive metabolic panel     Status: Abnormal   Collection Time: 08/06/21  2:52 AM  Result Value Ref Range   Sodium 131 (L) 135 - 145 mmol/L   Potassium 3.5 3.5 - 5.1 mmol/L   Chloride 96 (L) 98 - 111 mmol/L   CO2 23 22 - 32 mmol/L   Glucose, Bld 254 (H) 70 - 99 mg/dL    Comment: Glucose reference range applies only to samples taken after fasting for at least 8 hours.   BUN 19 8 - 23 mg/dL   Creatinine, Ser 0.92 0.44 - 1.00 mg/dL   Calcium 8.6 (L) 8.9 - 10.3 mg/dL   Total Protein 6.5 6.5 - 8.1 g/dL   Albumin 2.8 (L) 3.5 - 5.0 g/dL   AST 23 15 - 41 U/L   ALT 10 0 - 44 U/L   Alkaline Phosphatase 85 38 - 126 U/L   Total Bilirubin 0.5 0.3 - 1.2 mg/dL   GFR, Estimated >60 >60 mL/min    Comment: (NOTE) Calculated using the CKD-EPI Creatinine Equation (2021)    Anion gap 12 5 - 15    Comment: Performed at [facility], [place] [address], [place], [place] [address]  CBC with Differential/Platelet     Status: Abnormal   Collection Time: 08/06/21  2:52 AM  Result Value Ref Range   WBC 8.6 4.0 - 10.5 K/uL   RBC 3.13 (L) 3.87 - 5.11 MIL/uL   Hemoglobin 10.1 (L) 12.0 - 15.0 g/dL   HCT 29.8 (L) 36.0 - 46.0 %   MCV 95.2 80.0 - 100.0 fL   MCH 32.3 26.0 - 34.0 pg   MCHC 33.9 30.0 - 36.0 g/dL   RDW 15.7 (H) 11.5 - 15.5 %   Platelets 218 150 - 400 K/uL   nRBC 0.2 0.0 - 0.2 %   Neutrophils Relative % 93 %   Neutro Abs 8.0 (H) 1.7 - 7.7 K/uL   Lymphocytes Relative 2 %   Lymphs Abs 0.2 (L) 0.7 - 4.0 K/uL   Monocytes Relative 4 %   Monocytes Absolute 0.3 0.1 - 1.0 K/uL   Eosinophils Relative 0 %   Eosinophils Absolute 0.0 0.0 - 0.5 K/uL   Basophils  Relative 0 %   Basophils Absolute 0.0 0.0 - 0.1 K/uL   Immature Granulocytes 1 %   Abs Immature  Granulocytes 0.04 0.00 - 0.07 K/uL    Comment: Performed at [facility], [place] [address], [place], [place] [address]  Glucose, capillary     Status: Abnormal   Collection Time: 08/06/21  8:10 AM  Result Value Ref Range   Glucose-Capillary 305 (H) 70 - 99 mg/dL    Comment: Glucose reference range applies only to samples taken after fasting for at least 8 hours.  Glucose, capillary     Status: Abnormal   Collection Time: 08/06/21 11:13 AM  Result Value Ref Range   Glucose-Capillary 361 (H) 70 - 99 mg/dL    Comment: Glucose reference range applies only to samples taken after fasting for at least 8 hours.  Glucose, capillary     Status: Abnormal   Collection Time: 08/06/21  4:41 PM  Result Value Ref Range   Glucose-Capillary 354 (H) 70 - 99 mg/dL    Comment: Glucose reference range applies only to samples taken after fasting for at least 8 hours.  Glucose, capillary     Status: Abnormal   Collection Time: 08/06/21  9:11 PM  Result Value Ref Range   Glucose-Capillary 258 (H) 70 - 99 mg/dL    Comment: Glucose reference range applies only to samples taken after fasting for at least 8 hours.  Magnesium     Status: None   Collection Time: 08/07/21  2:54 AM  Result Value Ref Range   Magnesium 2.1 1.7 - 2.4 mg/dL    Comment: Performed at [facility], [place] [address] [place], [place] [address]  D-dimer, quantitative     Status: Abnormal   Collection Time: 08/07/21  2:54 AM  Result Value Ref Range   D-Dimer, Quant 1.69 (H) 0.00 - 0.50 ug/mL-FEU    Comment: (NOTE) At the manufacturer cut-off value of 0.5 g/mL FEU, this assay has a negative predictive value of 95-100%.This assay is intended for use in conjunction with a clinical pretest probability (PTP) assessment model to exclude pulmonary embolism (PE) and deep venous thrombosis (DVT) in outpatients  suspected of PE or DVT. Results should be correlated with clinical presentation. Performed at [facility], [place] [address] [place], [place] [address]   C-reactive protein     Status: Abnormal   Collection Time: 08/07/21  2:54 AM  Result Value Ref Range   CRP 5.6 (H) <1.0 mg/dL    Comment: Performed at [facility] [address] [place], [place] [address]  Comprehensive metabolic panel     Status: Abnormal   Collection Time: 08/07/21  2:54 AM  Result Value Ref Range   Sodium 131 (L) 135 - 145 mmol/L   Potassium 4.0 3.5 - 5.1 mmol/L   Chloride 96 (L) 98 - 111 mmol/L   CO2 25 22 - 32 mmol/L   Glucose, Bld 281 (H) 70 - 99 mg/dL    Comment: Glucose reference range applies only to samples taken after fasting for at least 8 hours.   BUN 21 8 - 23 mg/dL   Creatinine, Ser 0.91 0.44 - 1.00 mg/dL   Calcium 8.9 8.9 - 10.3 mg/dL   Total Protein 6.8 6.5 - 8.1 g/dL   Albumin 2.9 (L) 3.5 - 5.0 g/dL   AST 29 15 - 41 U/L   ALT 9 0 - 44 U/L   Alkaline Phosphatase 111 38 - 126 U/L   Total Bilirubin 0.7 0.3 - 1.2 mg/dL   GFR, Estimated >60 >60 mL/min    Comment: (NOTE) Calculated using  the CKD-EPI Creatinine Equation (2021)    Anion gap 10 5 - 15    Comment: Performed at [facility], [place] [address] [place], [place] [address]  CBC with Differential/Platelet     Status: Abnormal   Collection Time: 08/07/21  2:54 AM  Result Value Ref Range   WBC 10.7 (H) 4.0 - 10.5 K/uL   RBC 3.38 (L) 3.87 - 5.11 MIL/uL   Hemoglobin 10.8 (L) 12.0 - 15.0 g/dL   HCT 32.2 (L) 36.0 - 46.0 %   MCV 95.3 80.0 - 100.0 fL   MCH 32.0 26.0 - 34.0 pg   MCHC 33.5 30.0 - 36.0 g/dL   RDW 15.6 (H) 11.5 - 15.5 %   Platelets 250 150 - 400 K/uL   nRBC 0.0 0.0 - 0.2 %   Neutrophils Relative % 93 %   Neutro Abs 10.0 (H) 1.7 - 7.7 K/uL   Lymphocytes Relative 2 %   Lymphs Abs 0.2 (L) 0.7 - 4.0 K/uL   Monocytes Relative 4 %   Monocytes Absolute 0.5 0.1 - 1.0 K/uL   Eosinophils  Relative 0 %   Eosinophils Absolute 0.0 0.0 - 0.5 K/uL   Basophils Relative 0 %   Basophils Absolute 0.0 0.0 - 0.1 K/uL   Immature Granulocytes 1 %   Abs Immature Granulocytes 0.09 (H) 0.00 - 0.07 K/uL    Comment: Performed at [facility], [place] [address], [place], [place] [address]  Glucose, capillary     Status: Abnormal   Collection Time: 08/07/21  7:25 AM  Result Value Ref Range   Glucose-Capillary 313 (H) 70 - 99 mg/dL    Comment: Glucose reference range applies only to samples taken after fasting for at least 8 hours.  Glucose, capillary     Status: Abnormal   Collection Time: 08/07/21 11:08 AM  Result Value Ref Range   Glucose-Capillary 247 (H) 70 - 99 mg/dL    Comment: Glucose reference range applies only to samples taken after fasting for at least 8 hours.  Glucose, capillary     Status: Abnormal   Collection Time: 08/07/21  4:58 PM  Result Value Ref Range   Glucose-Capillary 329 (H) 70 - 99 mg/dL    Comment: Glucose reference range applies only to samples taken after fasting for at least 8 hours.  Glucose, capillary     Status: Abnormal   Collection Time: 08/07/21  9:11 PM  Result Value Ref Range   Glucose-Capillary 320 (H) 70 - 99 mg/dL    Comment: Glucose reference range applies only to samples taken after fasting for at least 8 hours.  Magnesium     Status: None   Collection Time: 08/08/21  2:56 AM  Result Value Ref Range   Magnesium 2.1 1.7 - 2.4 mg/dL    Comment: Performed at [facility], [place] [address], [place], [place] [address]  D-dimer, quantitative     Status: Abnormal   Collection Time: 08/08/21  2:56 AM  Result Value Ref Range   D-Dimer, Quant 1.84 (H) 0.00 - 0.50 ug/mL-FEU    Comment: (NOTE) At the manufacturer cut-off value of 0.5 g/mL FEU, this assay has a negative predictive value of 95-100%.This assay is intended for use in conjunction with a clinical pretest probability (PTP) assessment model to exclude  pulmonary embolism (PE) and deep venous thrombosis (DVT) in outpatients suspected of PE or DVT. Results should be correlated with clinical presentation. Performed at [facility], [place]  [address] [place], [place] [address]   C-reactive protein     Status: Abnormal   Collection Time: 08/08/21  2:56 AM  Result Value Ref Range   CRP 3.9 (H) <1.0 mg/dL    Comment: Performed at [facility] [address], [place], [place] [address]  Comprehensive metabolic panel     Status: Abnormal   Collection Time: 08/08/21  2:56 AM  Result Value Ref Range   Sodium 137 135 - 145 mmol/L   Potassium 3.9 3.5 - 5.1 mmol/L   Chloride 100 98 - 111 mmol/L   CO2 27 22 - 32 mmol/L   Glucose, Bld 255 (H) 70 - 99 mg/dL    Comment: Glucose reference range applies only to samples taken after fasting for at least 8 hours.   BUN 26 (H) 8 - 23 mg/dL   Creatinine, Ser 0.74 0.44 - 1.00 mg/dL   Calcium 9.0 8.9 - 10.3 mg/dL   Total Protein 6.6 6.5 - 8.1 g/dL   Albumin 2.7 (L) 3.5 - 5.0 g/dL   AST 17 15 - 41 U/L   ALT 9 0 - 44 U/L   Alkaline Phosphatase 112 38 - 126 U/L   Total Bilirubin 0.5 0.3 - 1.2 mg/dL   GFR, Estimated >60 >60 mL/min    Comment: (NOTE) Calculated using the CKD-EPI Creatinine Equation (2021)    Anion gap 10 5 - 15    Comment: Performed at [facility], [place] [address] [place], [place] [address]  CBC with Differential/Platelet     Status: Abnormal   Collection Time: 08/08/21  2:56 AM  Result Value Ref Range   WBC 11.1 (H) 4.0 - 10.5 K/uL   RBC 3.35 (L) 3.87 - 5.11 MIL/uL   Hemoglobin 10.7 (L) 12.0 - 15.0 g/dL   HCT 32.1 (L) 36.0 - 46.0 %   MCV 95.8 80.0 - 100.0 fL   MCH 31.9 26.0 - 34.0 pg   MCHC 33.3 30.0 - 36.0 g/dL   RDW 15.2 11.5 - 15.5 %   Platelets 282 150 - 400 K/uL   nRBC 0.2 0.0 - 0.2 %   Neutrophils Relative % 93 %   Neutro Abs 10.4 (H) 1.7 - 7.7 K/uL   Lymphocytes Relative 2 %   Lymphs Abs 0.2 (L) 0.7 - 4.0 K/uL   Monocytes  Relative 4 %   Monocytes Absolute 0.4 0.1 - 1.0 K/uL   Eosinophils Relative 0 %   Eosinophils Absolute 0.0 0.0 - 0.5 K/uL   Basophils Relative 0 %   Basophils Absolute 0.0 0.0 - 0.1 K/uL   Immature Granulocytes 1 %   Abs Immature Granulocytes 0.11 (H) 0.00 - 0.07 K/uL    Comment: Performed at [facility], [place] [address], [place], [place] [address]  Glucose, capillary     Status: Abnormal   Collection Time: 08/08/21  8:42 AM  Result Value Ref Range   Glucose-Capillary 329 (H) 70 - 99 mg/dL    Comment: Glucose reference range applies only to samples taken after fasting for at least 8 hours.  Glucose, capillary     Status: Abnormal   Collection Time: 08/08/21 12:39 PM  Result Value Ref Range   Glucose-Capillary 368 (H) 70 - 99 mg/dL    Comment: Glucose reference range applies only to samples taken after fasting for at least 8 hours.  Glucose, capillary     Status: Abnormal   Collection Time: 08/08/21  4:49 PM  Result Value Ref Range  Glucose-Capillary 301 (H) 70 - 99 mg/dL    Comment: Glucose reference range applies only to samples taken after fasting for at least 8 hours.   Comment 1 Notify RN    Comment 2 Document in Chart   Glucose, capillary     Status: Abnormal   Collection Time: 08/08/21  9:02 PM  Result Value Ref Range   Glucose-Capillary 207 (H) 70 - 99 mg/dL    Comment: Glucose reference range applies only to samples taken after fasting for at least 8 hours.  Magnesium     Status: None   Collection Time: 08/09/21  2:53 AM  Result Value Ref Range   Magnesium 2.2 1.7 - 2.4 mg/dL    Comment: Performed at [facility], [place] [address], [place], [place] [address]  D-dimer, quantitative     Status: Abnormal   Collection Time: 08/09/21  2:53 AM  Result Value Ref Range   D-Dimer, Quant 2.19 (H) 0.00 - 0.50 ug/mL-FEU    Comment: (NOTE) At the manufacturer cut-off value of 0.5 g/mL FEU, this assay has a negative predictive value of  95-100%.This assay is intended for use in conjunction with a clinical pretest probability (PTP) assessment model to exclude pulmonary embolism (PE) and deep venous thrombosis (DVT) in outpatients suspected of PE or DVT. Results should be correlated with clinical presentation. Performed at [facility], [place] [address], [place], [place] [address]   C-reactive protein     Status: Abnormal   Collection Time: 08/09/21  2:53 AM  Result Value Ref Range   CRP 3.3 (H) <1.0 mg/dL    Comment: Performed at [place] [address], [place], [place] [address]  Comprehensive metabolic panel     Status: Abnormal   Collection Time: 08/09/21  2:53 AM  Result Value Ref Range   Sodium 137 135 - 145 mmol/L   Potassium 4.1 3.5 - 5.1 mmol/L   Chloride 102 98 - 111 mmol/L   CO2 27 22 - 32 mmol/L   Glucose, Bld 204 (H) 70 - 99 mg/dL    Comment: Glucose reference range applies only to samples taken after fasting for at least 8 hours.   BUN 34 (H) 8 - 23 mg/dL   Creatinine, Ser 0.78 0.44 - 1.00 mg/dL   Calcium 8.7 (L) 8.9 - 10.3 mg/dL   Total Protein 6.4 (L) 6.5 - 8.1 g/dL   Albumin 2.6 (L) 3.5 - 5.0 g/dL   AST 15 15 - 41 U/L   ALT 10 0 - 44 U/L   Alkaline Phosphatase 117 38 - 126 U/L   Total Bilirubin 0.6 0.3 - 1.2 mg/dL   GFR, Estimated >60 >60 mL/min    Comment: (NOTE) Calculated using the CKD-EPI Creatinine Equation (2021)    Anion gap 8 5 - 15    Comment: Performed at [facility], [place] [address], [place], [place] [address]  CBC with Differential/Platelet     Status: Abnormal   Collection Time: 08/09/21  2:53 AM  Result Value Ref Range   WBC 10.1 4.0 - 10.5 K/uL   RBC 3.54 (L) 3.87 - 5.11 MIL/uL   Hemoglobin 11.2 (L) 12.0 - 15.0 g/dL   HCT 34.7 (L) 36.0 - 46.0 %   MCV 98.0 80.0 - 100.0 fL   MCH 31.6 26.0 - 34.0 pg   MCHC 32.3 30.0 - 36.0 g/dL   RDW 15.3 11.5 - 15.5 %   Platelets 275 150 - 400 K/uL  nRBC 0.2 0.0 - 0.2 %   Neutrophils  Relative % 93 %   Neutro Abs 9.3 (H) 1.7 - 7.7 K/uL   Lymphocytes Relative 2 %   Lymphs Abs 0.2 (L) 0.7 - 4.0 K/uL   Monocytes Relative 4 %   Monocytes Absolute 0.4 0.1 - 1.0 K/uL   Eosinophils Relative 0 %   Eosinophils Absolute 0.0 0.0 - 0.5 K/uL   Basophils Relative 0 %   Basophils Absolute 0.0 0.0 - 0.1 K/uL   Immature Granulocytes 1 %   Abs Immature Granulocytes 0.13 (H) 0.00 - 0.07 K/uL    Comment: Performed at [facility], [place] [address], [place], [place] [address]  Brain natriuretic peptide     Status: Abnormal   Collection Time: 08/09/21  7:00 AM  Result Value Ref Range   B Natriuretic Peptide 551.0 (H) 0.0 - 100.0 pg/mL    Comment: Performed at [facility], [place] [address] [place], [place] [address]  Glucose, capillary     Status: Abnormal   Collection Time: 08/09/21  8:12 AM  Result Value Ref Range   Glucose-Capillary 206 (H) 70 - 99 mg/dL    Comment: Glucose reference range applies only to samples taken after fasting for at least 8 hours.  Troponin I (High Sensitivity)     Status: None   Collection Time: 08/09/21  9:21 AM  Result Value Ref Range   Troponin I (High Sensitivity) 16 <18 ng/L    Comment: (NOTE) Elevated high sensitivity troponin I (hsTnI) values and significant  changes across serial measurements may suggest ACS but many other  chronic and acute conditions are known to elevate hsTnI results.  Refer to the "Links" section for chest pain algorithms and additional  guidance. Performed at [facility], [place] [address], [place], [place] [address]   Troponin I (High Sensitivity)     Status: Abnormal   Collection Time: 08/09/21 11:17 AM  Result Value Ref Range   Troponin I (High Sensitivity) 150 (HH) <18 ng/L    Comment: CRITICAL RESULT CALLED TO, READ BACK BY AND VERIFIED WITH: COCHRAN, H. RN ON 08/09/2021 @ 1225 BY MECIAL J. (NOTE) Elevated high sensitivity troponin I (hsTnI) values and  significant  changes across serial measurements may suggest ACS but many other  chronic and acute conditions are known to elevate hsTnI results.  Refer to the Links section for chest pain algorithms and additional  guidance. Performed at [facility], [place] [address], [place], [place] [address]   Glucose, capillary     Status: Abnormal   Collection Time: 08/09/21 11:23 AM  Result Value Ref Range   Glucose-Capillary 233 (H) 70 - 99 mg/dL    Comment: Glucose reference range applies only to samples taken after fasting for at least 8 hours.   Comment 1 Notify RN    Comment 2 Document in Chart   Troponin I (High Sensitivity)     Status: Abnormal   Collection Time: 08/09/21 12:51 PM  Result Value Ref Range   Troponin I (High Sensitivity) 928 (HH) <18 ng/L    Comment: CRITICAL RESULT CALLED TO, READ BACK BY AND VERIFIED WITH: COCHRAN, H. RN ON 08/09/2021 1415 MECIAL J. (NOTE) Elevated high sensitivity troponin I (hsTnI) values and significant  changes across serial measurements may suggest ACS but many other  chronic and acute conditions are known to elevate hsTnI results.  Refer to the Links section for chest pain algorithms and additional  guidance.  Performed at [facility], [place] [address], [place], [place] [address]   ECHOCARDIOGRAM COMPLETE     Status: None   Collection Time: 08/09/21  1:05 PM  Result Value Ref Range   Weight 3,283.97 oz   Height 61 in   BP 161/81 mmHg   S' Lateral 3.30 cm  Glucose, capillary     Status: Abnormal   Collection Time: 08/09/21  3:40 PM  Result Value Ref Range   Glucose-Capillary 181 (H) 70 - 99 mg/dL    Comment: Glucose reference range applies only to samples taken after fasting for at least 8 hours.   Comment 1 Notify RN    Comment 2 Document in Chart   Troponin I (High Sensitivity)     Status: Abnormal   Collection Time: 08/09/21  3:41 PM  Result Value Ref Range   Troponin I (High Sensitivity) 2,835  (HH) <18 ng/L    Comment: CRITICAL RESULT CALLED TO, READ BACK BY AND VERIFIED WITH: CHOCHRAN,H. RN ON 08/09/2021 @ 1742 BY MECIAL J. (NOTE) Elevated high sensitivity troponin I (hsTnI) values and significant  changes across serial measurements may suggest ACS but many other  chronic and acute conditions are known to elevate hsTnI results.  Refer to the Links section for chest pain algorithms and additional  guidance. Performed at [facility], [place] [address], [place], [place] [address]   Glucose, capillary     Status: Abnormal   Collection Time: 08/09/21  4:59 PM  Result Value Ref Range   Glucose-Capillary 149 (H) 70 - 99 mg/dL    Comment: Glucose reference range applies only to samples taken after fasting for at least 8 hours.   Comment 1 Notify RN    Comment 2 Document in Chart   APTT     Status: None   Collection Time: 08/09/21  8:50 PM  Result Value Ref Range   aPTT 25 24 - 36 seconds    Comment: Performed at [facility], [place] [address], [place], [place] [address]  Heparin level (unfractionated)     Status: Abnormal   Collection Time: 08/09/21  8:50 PM  Result Value Ref Range   Heparin Unfractionated >1.10 (H) 0.30 - 0.70 IU/mL    Comment: (NOTE) The clinical reportable range upper limit is being lowered to >1.10 to align with the FDA approved guidance for the current laboratory assay.  If heparin results are below expected values, and patient dosage has  been confirmed, suggest follow up testing of antithrombin III levels. Performed at [facility], [place] [address] [place], [place] [address]   CBC with Differential/Platelet     Status: Abnormal   Collection Time: 08/10/21  5:15 AM  Result Value Ref Range   WBC 9.2 4.0 - 10.5 K/uL   RBC 3.35 (L) 3.87 - 5.11 MIL/uL   Hemoglobin 10.6 (L) 12.0 - 15.0 g/dL   HCT 32.0 (L) 36.0 - 46.0 %   MCV 95.5 80.0 - 100.0 fL   MCH 31.6 26.0 - 34.0 pg   MCHC 33.1 30.0 - 36.0  g/dL   RDW 15.3 11.5 - 15.5 %   Platelets 315 150 - 400 K/uL   nRBC 0.2 0.0 - 0.2 %   Neutrophils Relative % 91 %   Neutro Abs 8.4 (H) 1.7 - 7.7 K/uL   Lymphocytes Relative 3 %   Lymphs Abs 0.3 (L) 0.7 - 4.0 K/uL   Monocytes Relative 5 %   Monocytes Absolute 0.4  0.1 - 1.0 K/uL   Eosinophils Relative 0 %   Eosinophils Absolute 0.0 0.0 - 0.5 K/uL   Basophils Relative 0 %   Basophils Absolute 0.0 0.0 - 0.1 K/uL   Immature Granulocytes 1 %   Abs Immature Granulocytes 0.13 (H) 0.00 - 0.07 K/uL    Comment: Performed at [facility], [place] [address] [place], [place] [address]  Comprehensive metabolic panel     Status: Abnormal   Collection Time: 08/10/21  5:15 AM  Result Value Ref Range   Sodium 138 135 - 145 mmol/L   Potassium 3.4 (L) 3.5 - 5.1 mmol/L    Comment: DELTA CHECK NOTED   Chloride 99 98 - 111 mmol/L   CO2 31 22 - 32 mmol/L   Glucose, Bld 119 (H) 70 - 99 mg/dL    Comment: Glucose reference range applies only to samples taken after fasting for at least 8 hours.   BUN 38 (H) 8 - 23 mg/dL   Creatinine, Ser 0.77 0.44 - 1.00 mg/dL   Calcium 8.4 (L) 8.9 - 10.3 mg/dL   Total Protein 5.9 (L) 6.5 - 8.1 g/dL   Albumin 2.3 (L) 3.5 - 5.0 g/dL   AST 27 15 - 41 U/L   ALT 11 0 - 44 U/L   Alkaline Phosphatase 99 38 - 126 U/L   Total Bilirubin 0.5 0.3 - 1.2 mg/dL   GFR, Estimated >60 >60 mL/min    Comment: (NOTE) Calculated using the CKD-EPI Creatinine Equation (2021)    Anion gap 8 5 - 15    Comment: Performed at [facility], [place] [address], [place], [place] [address]  Magnesium     Status: None   Collection Time: 08/10/21  5:15 AM  Result Value Ref Range   Magnesium 2.1 1.7 - 2.4 mg/dL    Comment: Performed at [facility], [place] [address], [place], [place] [address]  Phosphorus     Status: None   Collection Time: 08/10/21  5:15 AM  Result Value Ref Range   Phosphorus 3.7 2.5 - 4.6 mg/dL    Comment: Performed at [facility], [place] [address] [place], [place] [address]  Brain natriuretic peptide     Status: Abnormal   Collection Time: 08/10/21  5:15 AM  Result Value Ref Range   B Natriuretic Peptide 320.3 (H) 0.0 - 100.0 pg/mL    Comment: Performed at [facility], [place] [address], [place], [place] [address]  D-dimer, quantitative     Status: Abnormal   Collection Time: 08/10/21  5:15 AM  Result Value Ref Range   D-Dimer, Quant 2.01 (H) 0.00 - 0.50 ug/mL-FEU    Comment: (NOTE) At the manufacturer cut-off value of 0.5 g/mL FEU, this assay has a negative predictive value of 95-100%.This assay is intended for use in conjunction with a clinical pretest probability (PTP) assessment model to exclude pulmonary embolism (PE) and deep venous thrombosis (DVT) in outpatients suspected of PE or DVT. Results should be correlated with clinical presentation. Performed at [facility], [place] [address] [place],  [address]   APTT     Status: Abnormal   Collection Time: 08/10/21  5:15 AM  Result Value Ref Range   aPTT 74 (H) 24 - 36 seconds    Comment:        IF BASELINE aPTT IS ELEVATED, SUGGEST PATIENT RISK ASSESSMENT BE USED TO DETERMINE APPROPRIATE ANTICOAGULANT THERAPY. Performed at [facility],  [place] [address], [place], [place] [address]   Troponin I (High Sensitivity)     Status: Abnormal   Collection Time: 08/10/21  5:15 AM  Result Value Ref Range   Troponin I (High Sensitivity) 4,125 (HH) <18 ng/L    Comment: DELTA CHECK NOTED CRITICAL RESULT CALLED TO, READ BACK BY AND VERIFIED WITH: HARRISON,D. RN AT 3491 08/10/21 MULLINS,T (NOTE) Elevated high sensitivity troponin I (hsTnI) values and significant  changes across serial measurements may suggest ACS but many other  chronic and acute conditions are known to elevate hsTnI results.  Refer to the Links section for chest pain algorithms and additional   guidance. Performed at [facility], [place] [address], [place], [place] [address]   Glucose, capillary     Status: Abnormal   Collection Time: 08/10/21  7:59 AM  Result Value Ref Range   Glucose-Capillary 115 (H) 70 - 99 mg/dL    Comment: Glucose reference range applies only to samples taken after fasting for at least 8 hours.  Glucose, capillary     Status: Abnormal   Collection Time: 08/10/21 11:37 AM  Result Value Ref Range   Glucose-Capillary 137 (H) 70 - 99 mg/dL    Comment: Glucose reference range applies only to samples taken after fasting for at least 8 hours.  APTT     Status: Abnormal   Collection Time: 08/10/21  2:00 PM  Result Value Ref Range   aPTT 92 (H) 24 - 36 seconds    Comment:        IF BASELINE aPTT IS ELEVATED, SUGGEST PATIENT RISK ASSESSMENT BE USED TO DETERMINE APPROPRIATE ANTICOAGULANT THERAPY. Performed at [facility], [place] [address] [place], [place] [address]   Glucose, capillary     Status: Abnormal   Collection Time: 08/10/21  5:56 PM  Result Value Ref Range   Glucose-Capillary 242 (H) 70 - 99 mg/dL    Comment: Glucose reference range applies only to samples taken after fasting for at least 8 hours.  Glucose, capillary     Status: Abnormal   Collection Time: 08/10/21  9:59 PM  Result Value Ref Range   Glucose-Capillary 238 (H) 70 - 99 mg/dL    Comment: Glucose reference range applies only to samples taken after fasting for at least 8 hours.  Heparin level (unfractionated)     Status: Abnormal   Collection Time: 08/11/21  3:09 AM  Result Value Ref Range   Heparin Unfractionated >1.10 (H) 0.30 - 0.70 IU/mL    Comment: (NOTE) The clinical reportable range upper limit is being lowered to >1.10 to align with the FDA approved guidance for the current laboratory assay.  If heparin results are below expected values, and patient dosage has  been confirmed, suggest follow up testing of antithrombin III  levels. Performed at [facility], [place] [address], [place], [place] [address]   CBC with Differential/Platelet     Status: Abnormal   Collection Time: 08/11/21  3:09 AM  Result Value Ref Range   WBC 8.4 4.0 - 10.5 K/uL   RBC 3.64 (L) 3.87 - 5.11 MIL/uL   Hemoglobin 11.6 (L) 12.0 - 15.0 g/dL   HCT 35.0 (L) 36.0 - 46.0 %   MCV 96.2 80.0 - 100.0 fL   MCH 31.9 26.0 - 34.0 pg   MCHC 33.1 30.0 - 36.0 g/dL   RDW 15.5 11.5 - 15.5 %   Platelets 332 150 - 400 K/uL   nRBC 0.2 0.0 - 0.2 %  Neutrophils Relative % 94 %   Neutro Abs 7.9 (H) 1.7 - 7.7 K/uL   Lymphocytes Relative 2 %   Lymphs Abs 0.1 (L) 0.7 - 4.0 K/uL   Monocytes Relative 3 %   Monocytes Absolute 0.2 0.1 - 1.0 K/uL   Eosinophils Relative 0 %   Eosinophils Absolute 0.0 0.0 - 0.5 K/uL   Basophils Relative 0 %   Basophils Absolute 0.0 0.0 - 0.1 K/uL   Immature Granulocytes 1 %   Abs Immature Granulocytes 0.10 (H) 0.00 - 0.07 K/uL    Comment: Performed at [facility], [place] [address] [place], [place] [address]  Comprehensive metabolic panel     Status: Abnormal   Collection Time: 08/11/21  3:09 AM  Result Value Ref Range   Sodium 137 135 - 145 mmol/L   Potassium 4.3 3.5 - 5.1 mmol/L    Comment: DELTA CHECK NOTED   Chloride 98 98 - 111 mmol/L   CO2 30 22 - 32 mmol/L   Glucose, Bld 211 (H) 70 - 99 mg/dL    Comment: Glucose reference range applies only to samples taken after fasting for at least 8 hours.   BUN 41 (H) 8 - 23 mg/dL   Creatinine, Ser 0.87 0.44 - 1.00 mg/dL   Calcium 8.7 (L) 8.9 - 10.3 mg/dL   Total Protein 6.4 (L) 6.5 - 8.1 g/dL   Albumin 2.6 (L) 3.5 - 5.0 g/dL   AST 23 15 - 41 U/L   ALT 13 0 - 44 U/L   Alkaline Phosphatase 115 38 - 126 U/L   Total Bilirubin 0.6 0.3 - 1.2 mg/dL   GFR, Estimated >60 >60 mL/min    Comment: (NOTE) Calculated using the CKD-EPI Creatinine Equation (2021)    Anion gap 9 5 - 15    Comment: Performed at [facility], [place] [address], [place], [place] [address]  Magnesium     Status: None   Collection Time: 08/11/21  3:09 AM  Result Value Ref Range   Magnesium 2.2 1.7 - 2.4 mg/dL    Comment: Performed at [facility], [place] [address] [place], [place] [address]  Phosphorus     Status: None   Collection Time: 08/11/21  3:09 AM  Result Value Ref Range   Phosphorus 3.1 2.5 - 4.6 mg/dL    Comment: Performed at [facility], [place] [address], [place], [place] [address]  Brain natriuretic peptide     Status: Abnormal   Collection Time: 08/11/21  3:09 AM  Result Value Ref Range   B Natriuretic Peptide 202.5 (H) 0.0 - 100.0 pg/mL    Comment: Performed at [facility], [place] [address], [place], [place] [address]  C-reactive protein     Status: Abnormal   Collection Time: 08/11/21  3:09 AM  Result Value Ref Range   CRP 2.4 (H) <1.0 mg/dL    Comment: Performed at [place] [address], [place], [place] [address]  D-dimer, quantitative     Status: Abnormal   Collection Time: 08/11/21  3:09 AM  Result Value Ref Range   D-Dimer, Quant 1.43 (H) 0.00 - 0.50 ug/mL-FEU    Comment: (NOTE) At the manufacturer cut-off value of 0.5 g/mL FEU, this assay has a negative predictive value of 95-100%.This assay is intended for use in conjunction with a clinical pretest probability (PTP) assessment model to exclude pulmonary embolism (PE) and deep venous thrombosis (DVT) in outpatients suspected of  PE or DVT. Results should be correlated with clinical presentation. Performed at [facility], [place] [address], [place], [place] [address]   APTT     Status: Abnormal   Collection Time: 08/11/21  3:09 AM  Result Value Ref Range   aPTT 105 (H) 24 - 36 seconds    Comment:        IF BASELINE aPTT IS ELEVATED, SUGGEST PATIENT RISK ASSESSMENT BE USED TO DETERMINE APPROPRIATE ANTICOAGULANT THERAPY. Performed at [facility], [place] [address], [place], [place] [address]   Troponin I (High Sensitivity)     Status: Abnormal   Collection Time: 08/11/21  5:39 AM  Result Value Ref Range   Troponin I (High Sensitivity) 1,660 (HH) <18 ng/L    Comment: DELTA CHECK NOTED CRITICAL VALUE NOTED.  VALUE IS CONSISTENT WITH PREVIOUSLY REPORTED AND CALLED VALUE. (NOTE) Elevated high sensitivity troponin I (hsTnI) values and significant  changes across serial measurements may suggest ACS but many other  chronic and acute conditions are known to elevate hsTnI results.  Refer to the Links section for chest pain algorithms and additional  guidance. Performed at [facility], [place] [address] [place], [place] [address]   Glucose, capillary     Status: Abnormal   Collection Time: 08/11/21  8:42 AM  Result Value Ref Range   Glucose-Capillary 244 (H) 70 - 99 mg/dL    Comment: Glucose reference range applies only to samples taken after fasting for at least 8 hours.  Troponin I (High Sensitivity)     Status: Abnormal   Collection Time: 08/11/21 10:40 AM  Result Value Ref Range   Troponin I (High Sensitivity) 1,171 (HH) <18 ng/L    Comment: DELTA CHECK NOTED CRITICAL VALUE NOTED.  VALUE IS CONSISTENT WITH PREVIOUSLY REPORTED AND CALLED VALUE. (NOTE) Elevated high sensitivity troponin I (hsTnI) values and significant  changes across serial measurements may suggest ACS but many other  chronic and acute conditions are known to elevate hsTnI results.  Refer to the Links section for chest pain algorithms and additional  guidance. Performed at [facility], [place] [address] [place] [address], [place], [place] [address]   Glucose, capillary     Status: Abnormal   Collection Time: 08/11/21  1:21 PM  Result Value Ref Range   Glucose-Capillary 288 (H) 70 - 99 mg/dL    Comment: Glucose reference range applies only to samples taken after fasting for at least 8 hours.   Comment 1 Notify RN    Comment 2 Document in Chart    Troponin I (High Sensitivity)     Status: Abnormal   Collection Time: 08/11/21  2:31 PM  Result Value Ref Range   Troponin I (High Sensitivity) 877 (HH) <18 ng/L    Comment: DELTA CHECK NOTED CRITICAL VALUE NOTED.  VALUE IS CONSISTENT WITH PREVIOUSLY REPORTED AND CALLED VALUE. (NOTE) Elevated high sensitivity troponin I (hsTnI) values and significant  changes across serial measurements may suggest ACS but many other  chronic and acute conditions are known to elevate hsTnI results.  Refer to the Links section for chest pain algorithms and additional  guidance. Performed at [facility], [place] [address] [place], [place] [address]   APTT     Status: Abnormal   Collection Time: 08/11/21  2:31 PM  Result Value Ref Range   aPTT 115 (H) 24 - 36 seconds    Comment:        IF BASELINE aPTT IS ELEVATED, SUGGEST  PATIENT RISK ASSESSMENT BE USED TO DETERMINE APPROPRIATE ANTICOAGULANT THERAPY. Performed at [facility], [place] [address] [place], [place] [address]   Glucose, capillary     Status: Abnormal   Collection Time: 08/11/21  5:43 PM  Result Value Ref Range   Glucose-Capillary 404 (H) 70 - 99 mg/dL    Comment: Glucose reference range applies only to samples taken after fasting for at least 8 hours.   Comment 1 Notify RN    Comment 2 Document in Chart   Hemoglobin A1c     Status: Abnormal   Collection Time: 08/11/21  7:10 PM  Result Value Ref Range   Hgb A1c MFr Bld 7.9 (H) 4.8 - 5.6 %    Comment: (NOTE) Pre diabetes:          5.7%-6.4%  Diabetes:              >6.4%  Glycemic control for   <7.0% adults with diabetes    Mean Plasma Glucose 180.03 mg/dL    Comment: Performed at [place] [address] [place], [place] [address]  Glucose, capillary     Status: Abnormal   Collection Time: 08/11/21  9:27 PM  Result Value Ref Range   Glucose-Capillary 229 (H) 70 - 99 mg/dL    Comment: Glucose reference range applies only to  samples taken after fasting for at least 8 hours.  APTT     Status: Abnormal   Collection Time: 08/11/21 10:31 PM  Result Value Ref Range   aPTT 73 (H) 24 - 36 seconds    Comment:        IF BASELINE aPTT IS ELEVATED, SUGGEST PATIENT RISK ASSESSMENT BE USED TO DETERMINE APPROPRIATE ANTICOAGULANT THERAPY. Performed at [facility], [place] [address] [place], [place] [address]   Troponin I (High Sensitivity)     Status: Abnormal   Collection Time: 08/12/21  3:11 AM  Result Value Ref Range   Troponin I (High Sensitivity) 926 (HH) <18 ng/L    Comment: DELTA CHECK NOTED CRITICAL RESULT CALLED TO, READ BACK BY AND VERIFIED WITH: GRIFFIN,C. RN AT 5726 08/12/21 MULLINS,T (NOTE) Elevated high sensitivity troponin I (hsTnI) values and significant  changes across serial measurements may suggest ACS but many other  chronic and acute conditions are known to elevate hsTnI results.  Refer to the Links section for chest pain algorithms and additional  guidance. Performed at [facility], [place] [address] [place], [place] [address]   Heparin level (unfractionated)     Status: Abnormal   Collection Time: 08/12/21  3:12 AM  Result Value Ref Range   Heparin Unfractionated 0.77 (H) 0.30 - 0.70 IU/mL    Comment: (NOTE) The clinical reportable range upper limit is being lowered to >1.10 to align with the FDA approved guidance for the current laboratory assay.  If heparin results are below expected values, and patient dosage has  been confirmed, suggest follow up testing of antithrombin III levels. Performed at [facility], [place] [address], [place], [place] [address]   APTT     Status: Abnormal   Collection Time: 08/12/21  3:12 AM  Result Value Ref Range   aPTT 70 (H) 24 - 36 seconds    Comment:        IF BASELINE aPTT IS ELEVATED, SUGGEST PATIENT RISK ASSESSMENT BE USED TO DETERMINE APPROPRIATE ANTICOAGULANT THERAPY. Performed at [facility], [place] [address], [place], [name] [address]  CBC with Differential/Platelet     Status: Abnormal   Collection Time: 08/12/21  3:12 AM  Result Value Ref Range   WBC 8.0 4.0 - 10.5 K/uL   RBC 3.54 (L) 3.87 - 5.11 MIL/uL   Hemoglobin 11.1 (L) 12.0 - 15.0 g/dL   HCT 33.1 (L) 36.0 - 46.0 %   MCV 93.5 80.0 - 100.0 fL   MCH 31.4 26.0 - 34.0 pg   MCHC 33.5 30.0 - 36.0 g/dL   RDW 14.9 11.5 - 15.5 %   Platelets 318 150 - 400 K/uL   nRBC 0.0 0.0 - 0.2 %   Neutrophils Relative % 92 %   Neutro Abs 7.3 1.7 - 7.7 K/uL   Lymphocytes Relative 2 %   Lymphs Abs 0.2 (L) 0.7 - 4.0 K/uL   Monocytes Relative 5 %   Monocytes Absolute 0.4 0.1 - 1.0 K/uL   Eosinophils Relative 0 %   Eosinophils Absolute 0.0 0.0 - 0.5 K/uL   Basophils Relative 0 %   Basophils Absolute 0.0 0.0 - 0.1 K/uL   Immature Granulocytes 1 %   Abs Immature Granulocytes 0.07 0.00 - 0.07 K/uL    Comment: Performed at [facility], [place] [address], [place], [place] [address]  Comprehensive metabolic panel     Status: Abnormal   Collection Time: 08/12/21  3:12 AM  Result Value Ref Range   Sodium 133 (L) 135 - 145 mmol/L   Potassium 4.1 3.5 - 5.1 mmol/L   Chloride 96 (L) 98 - 111 mmol/L   CO2 28 22 - 32 mmol/L   Glucose, Bld 249 (H) 70 - 99 mg/dL    Comment: Glucose reference range applies only to samples taken after fasting for at least 8 hours.   BUN 44 (H) 8 - 23 mg/dL   Creatinine, Ser 0.93 0.44 - 1.00 mg/dL   Calcium 8.8 (L) 8.9 - 10.3 mg/dL   Total Protein 6.1 (L) 6.5 - 8.1 g/dL   Albumin 2.4 (L) 3.5 - 5.0 g/dL   AST 18 15 - 41 U/L   ALT 14 0 - 44 U/L   Alkaline Phosphatase 129 (H) 38 - 126 U/L   Total Bilirubin 0.8 0.3 - 1.2 mg/dL   GFR, Estimated >60 >60 mL/min    Comment: (NOTE) Calculated using the CKD-EPI Creatinine Equation (2021)    Anion gap 9 5 - 15    Comment: Performed at [facility], [place] [address], [place], [place] [address]  Magnesium      Status: None   Collection Time: 08/12/21  3:12 AM  Result Value Ref Range   Magnesium 1.9 1.7 - 2.4 mg/dL    Comment: Performed at [facility], [place] [address], [place], [place] [address]  Phosphorus     Status: None   Collection Time: 08/12/21  3:12 AM  Result Value Ref Range   Phosphorus 3.1 2.5 - 4.6 mg/dL    Comment: Performed at [facility], [place] [address], [place], [place] [address]  Brain natriuretic peptide     Status: Abnormal   Collection Time: 08/12/21  3:12 AM  Result Value Ref Range   B Natriuretic Peptide 194.7 (H) 0.0 - 100.0 pg/mL    Comment: Performed at [facility], [place] [address] [place], [place] [address]  Glucose, capillary     Status: Abnormal   Collection Time: 08/12/21  7:36 AM  Result Value Ref Range   Glucose-Capillary 282 (H) 70 -  99 mg/dL    Comment: Glucose reference range applies only to samples taken after fasting for at least 8 hours.  Glucose, capillary     Status: Abnormal   Collection Time: 08/12/21  1:25 PM  Result Value Ref Range   Glucose-Capillary 307 (H) 70 - 99 mg/dL    Comment: Glucose reference range applies only to samples taken after fasting for at least 8 hours.  Glucose, capillary     Status: Abnormal   Collection Time: 08/12/21  5:12 PM  Result Value Ref Range   Glucose-Capillary 305 (H) 70 - 99 mg/dL    Comment: Glucose reference range applies only to samples taken after fasting for at least 8 hours.  Glucose, capillary     Status: Abnormal   Collection Time: 08/12/21  9:29 PM  Result Value Ref Range   Glucose-Capillary 229 (H) 70 - 99 mg/dL    Comment: Glucose reference range applies only to samples taken after fasting for at least 8 hours.  Heparin level (unfractionated)     Status: None   Collection Time: 08/13/21  4:10 AM  Result Value Ref Range   Heparin Unfractionated 0.59 0.30 - 0.70 IU/mL    Comment: (NOTE) The clinical reportable range upper limit is being  lowered to >1.10 to align with the FDA approved guidance for the current laboratory assay.  If heparin results are below expected values, and patient dosage has  been confirmed, suggest follow up testing of antithrombin III levels. Performed at [facility], [place] [address], [place], [place] [address]   APTT     Status: Abnormal   Collection Time: 08/13/21  4:10 AM  Result Value Ref Range   aPTT 46 (H) 24 - 36 seconds    Comment:        IF BASELINE aPTT IS ELEVATED, SUGGEST PATIENT RISK ASSESSMENT BE USED TO DETERMINE APPROPRIATE ANTICOAGULANT THERAPY. Performed at [facility], [place] [address], [place], [place] [address]   Comprehensive metabolic panel     Status: Abnormal   Collection Time: 08/13/21  4:10 AM  Result Value Ref Range   Sodium 131 (L) 135 - 145 mmol/L   Potassium 3.6 3.5 - 5.1 mmol/L   Chloride 92 (L) 98 - 111 mmol/L   CO2 29 22 - 32 mmol/L   Glucose, Bld 189 (H) 70 - 99 mg/dL    Comment: Glucose reference range applies only to samples taken after fasting for at least 8 hours.   BUN 47 (H) 8 - 23 mg/dL   Creatinine, Ser 0.89 0.44 - 1.00 mg/dL   Calcium 9.1 8.9 - 10.3 mg/dL   Total Protein 6.1 (L) 6.5 - 8.1 g/dL   Albumin 2.4 (L) 3.5 - 5.0 g/dL   AST 16 15 - 41 U/L   ALT 15 0 - 44 U/L   Alkaline Phosphatase 138 (H) 38 - 126 U/L   Total Bilirubin 0.7 0.3 - 1.2 mg/dL   GFR, Estimated >60 >60 mL/min    Comment: (NOTE) Calculated using the CKD-EPI Creatinine Equation (2021)    Anion gap 10 5 - 15    Comment: Performed at [facility], [place] [address], [place], [place] [address]  Magnesium     Status: None   Collection Time: 08/13/21  4:10 AM  Result Value Ref Range   Magnesium 1.9 1.7 - 2.4 mg/dL    Comment: Performed at [facility], [place] [address] [place], [place] [address]  Phosphorus  Status: None   Collection Time: 08/13/21  4:10 AM  Result Value Ref Range   Phosphorus 3.5  2.5 - 4.6 mg/dL    Comment: Performed at [facility], [place] [address], [place], [place] [address]  C-reactive protein     Status: Abnormal   Collection Time: 08/13/21  4:10 AM  Result Value Ref Range   CRP 1.1 (H) <1.0 mg/dL    Comment: Performed at [place] [address] [place], [place] [address]  D-dimer, quantitative     Status: Abnormal   Collection Time: 08/13/21  4:10 AM  Result Value Ref Range   D-Dimer, Quant 0.93 (H) 0.00 - 0.50 ug/mL-FEU    Comment: (NOTE) At the manufacturer cut-off value of 0.5 g/mL FEU, this assay has a negative predictive value of 95-100%.This assay is intended for use in conjunction with a clinical pretest probability (PTP) assessment model to exclude pulmonary embolism (PE) and deep venous thrombosis (DVT) in outpatients suspected of PE or DVT. Results should be correlated with clinical presentation. Performed at [facility], [place] [address] [place],  [address]   Procalcitonin     Status: None   Collection Time: 08/13/21  4:10 AM  Result Value Ref Range   Procalcitonin <0.10 ng/mL    Comment:        Interpretation: PCT (Procalcitonin) <= 0.5 ng/mL: Systemic infection (sepsis) is not likely. Local bacterial infection is possible. (NOTE)       Sepsis PCT Algorithm           Lower Respiratory Tract                                      Infection PCT Algorithm    ----------------------------     ----------------------------         PCT < 0.25 ng/mL                PCT < 0.10 ng/mL          Strongly encourage             Strongly discourage   discontinuation of antibiotics    initiation of antibiotics    ----------------------------     -----------------------------       PCT 0.25 - 0.50 ng/mL            PCT 0.10 - 0.25 ng/mL               OR       >80% decrease in PCT            Discourage initiation of                                            antibiotics      Encourage  discontinuation           of antibiotics    ----------------------------     -----------------------------         PCT >= 0.50 ng/mL              PCT 0.26 - 0.50 ng/mL               AND        <80% decrease in PCT  Encourage initiation of                                             antibiotics       Encourage continuation           of antibiotics    ----------------------------     -----------------------------        PCT >= 0.50 ng/mL                  PCT > 0.50 ng/mL               AND         increase in PCT                  Strongly encourage                                      initiation of antibiotics    Strongly encourage escalation           of antibiotics                                     -----------------------------                                           PCT <= 0.25 ng/mL                                                 OR                                        > 80% decrease in PCT                                      Discontinue / Do not initiate                                             antibiotics  Performed at [place] [address], [place], [place] [address]   Brain natriuretic peptide     Status: Abnormal   Collection Time: 08/13/21  4:57 AM  Result Value Ref Range   B Natriuretic Peptide 331.3 (H) 0.0 - 100.0 pg/mL    Comment: Performed at [facility], [place] [address] [place], [place] [address]  Glucose, capillary     Status: Abnormal   Collection Time: 08/13/21  8:07 AM  Result Value Ref Range   Glucose-Capillary 200 (H) 70 - 99 mg/dL    Comment: Glucose reference range applies only to samples taken after fasting for at least 8 hours.  Blood gas, arterial     Status:  Abnormal   Collection Time: 08/13/21  8:34 AM  Result Value Ref Range   FIO2 40.00    Delivery systems HI FLOW NASAL CANNULA    pH, Arterial 7.471 (H) 7.350 - 7.450   pCO2 arterial 41.6 32.0 - 48.0 mmHg   pO2, Arterial 69.5 (L)  83.0 - 108.0 mmHg   Bicarbonate 30.0 (H) 20.0 - 28.0 mmol/L   Acid-Base Excess 6.1 (H) 0.0 - 2.0 mmol/L   O2 Saturation 93.6 %   Patient temperature 98.6    Collection site RIGHT BRACHIAL    Drawn by [id number]    Allens test (pass/fail) PASS PASS    Comment: Performed at [facility], [place] [address], [place], [place] [address]  Glucose, capillary     Status: Abnormal   Collection Time: 08/13/21 12:05 PM  Result Value Ref Range   Glucose-Capillary 327 (H) 70 - 99 mg/dL    Comment: Glucose reference range applies only to samples taken after fasting for at least 8 hours.   Comment 1 Notify RN    Comment 2 Document in Chart   Glucose, capillary     Status: Abnormal   Collection Time: 08/13/21  5:23 PM  Result Value Ref Range   Glucose-Capillary 223 (H) 70 - 99 mg/dL    Comment: Glucose reference range applies only to samples taken after fasting for at least 8 hours.   Comment 1 Notify RN    Comment 2 Document in Chart   Glucose, capillary     Status: Abnormal   Collection Time: 08/13/21  9:34 PM  Result Value Ref Range   Glucose-Capillary 193 (H) 70 - 99 mg/dL    Comment: Glucose reference range applies only to samples taken after fasting for at least 8 hours.  Heparin level (unfractionated)     Status: None   Collection Time: 08/14/21  4:09 AM  Result Value Ref Range   Heparin Unfractionated 0.46 0.30 - 0.70 IU/mL    Comment: (NOTE) The clinical reportable range upper limit is being lowered to >1.10 to align with the FDA approved guidance for the current laboratory assay.  If heparin results are below expected values, and patient dosage has  been confirmed, suggest follow up testing of antithrombin III levels. Performed at [facility], [place] [address] [place], [place] [address]   CBC     Status: None   Collection Time: 08/14/21  4:10 AM  Result Value Ref Range   WBC 9.5 4.0 - 10.5 K/uL   RBC 3.89 3.87 - 5.11 MIL/uL   Hemoglobin 12.5 12.0  - 15.0 g/dL   HCT 36.8 36.0 - 46.0 %   MCV 94.6 80.0 - 100.0 fL   MCH 32.1 26.0 - 34.0 pg   MCHC 34.0 30.0 - 36.0 g/dL   RDW 15.0 11.5 - 15.5 %   Platelets 311 150 - 400 K/uL   nRBC 0.2 0.0 - 0.2 %    Comment: Performed at [facility], [place] [address], [place], [place] [address]  Procalcitonin     Status: None   Collection Time: 08/14/21  4:10 AM  Result Value Ref Range   Procalcitonin <0.10 ng/mL    Comment:        Interpretation: PCT (Procalcitonin) <= 0.5 ng/mL: Systemic infection (sepsis) is not likely. Local bacterial infection is possible. (NOTE)       Sepsis PCT Algorithm           Lower Respiratory Tract  Infection PCT Algorithm    ----------------------------     ----------------------------         PCT < 0.25 ng/mL                PCT < 0.10 ng/mL          Strongly encourage             Strongly discourage   discontinuation of antibiotics    initiation of antibiotics    ----------------------------     -----------------------------       PCT 0.25 - 0.50 ng/mL            PCT 0.10 - 0.25 ng/mL               OR       >80% decrease in PCT            Discourage initiation of                                            antibiotics      Encourage discontinuation           of antibiotics    ----------------------------     -----------------------------         PCT >= 0.50 ng/mL              PCT 0.26 - 0.50 ng/mL               AND        <80% decrease in PCT             Encourage initiation of                                             antibiotics       Encourage continuation           of antibiotics    ----------------------------     -----------------------------        PCT >= 0.50 ng/mL                  PCT > 0.50 ng/mL               AND         increase in PCT                  Strongly encourage                                      initiation of antibiotics    Strongly encourage escalation           of  antibiotics                                     -----------------------------                                           PCT <= 0.25 ng/mL  OR                                        > 80% decrease in PCT                                      Discontinue / Do not initiate                                             antibiotics  Performed at [place] [address], [place], [place] [address]   Glucose, capillary     Status: Abnormal   Collection Time: 08/14/21  8:16 AM  Result Value Ref Range   Glucose-Capillary 193 (H) 70 - 99 mg/dL    Comment: Glucose reference range applies only to samples taken after fasting for at least 8 hours.  Glucose, capillary     Status: Abnormal   Collection Time: 08/14/21 11:20 AM  Result Value Ref Range   Glucose-Capillary 311 (H) 70 - 99 mg/dL    Comment: Glucose reference range applies only to samples taken after fasting for at least 8 hours.  Glucose, capillary     Status: Abnormal   Collection Time: 08/14/21  4:28 PM  Result Value Ref Range   Glucose-Capillary 305 (H) 70 - 99 mg/dL    Comment: Glucose reference range applies only to samples taken after fasting for at least 8 hours.  Troponin I (High Sensitivity)     Status: Abnormal   Collection Time: 08/14/21  6:45 PM  Result Value Ref Range   Troponin I (High Sensitivity) 351 (HH) <18 ng/L    Comment: CRITICAL VALUE NOTED.  VALUE IS CONSISTENT WITH PREVIOUSLY REPORTED AND CALLED VALUE. (NOTE) Elevated high sensitivity troponin I (hsTnI) values and significant  changes across serial measurements may suggest ACS but many other  chronic and acute conditions are known to elevate hsTnI results.  Refer to the Links section for chest pain algorithms and additional  guidance. Performed at [facility], [place] [address] [place], [place] [address]   Glucose, capillary     Status: None   Collection Time: 08/14/21   9:36 PM  Result Value Ref Range   Glucose-Capillary 71 70 - 99 mg/dL    Comment: Glucose reference range applies only to samples taken after fasting for at least 8 hours.  Heparin level (unfractionated)     Status: None   Collection Time: 08/15/21  4:40 AM  Result Value Ref Range   Heparin Unfractionated 0.47 0.30 - 0.70 IU/mL    Comment: (NOTE) The clinical reportable range upper limit is being lowered to >1.10 to align with the FDA approved guidance for the current laboratory assay.  If heparin results are below expected values, and patient dosage has  been confirmed, suggest follow up testing of antithrombin III levels. Performed at [facility], [place] [address], [place], [place] [address]   CBC     Status: Abnormal   Collection Time: 08/15/21  4:40 AM  Result Value Ref Range   WBC 12.1 (H) 4.0 - 10.5 K/uL   RBC 4.21 3.87 - 5.11 MIL/uL   Hemoglobin 13.2 12.0 - 15.0 g/dL  HCT 39.9 36.0 - 46.0 %   MCV 94.8 80.0 - 100.0 fL   MCH 31.4 26.0 - 34.0 pg   MCHC 33.1 30.0 - 36.0 g/dL   RDW 15.1 11.5 - 15.5 %   Platelets 308 150 - 400 K/uL   nRBC 0.2 0.0 - 0.2 %    Comment: Performed at [facility], [place] [address] [place], [place] [address]  Comprehensive metabolic panel     Status: Abnormal   Collection Time: 08/15/21  4:40 AM  Result Value Ref Range   Sodium 135 135 - 145 mmol/L   Potassium 3.3 (L) 3.5 - 5.1 mmol/L   Chloride 94 (L) 98 - 111 mmol/L   CO2 31 22 - 32 mmol/L   Glucose, Bld 73 70 - 99 mg/dL    Comment: Glucose reference range applies only to samples taken after fasting for at least 8 hours.   BUN 58 (H) 8 - 23 mg/dL   Creatinine, Ser 0.92 0.44 - 1.00 mg/dL   Calcium 9.4 8.9 - 10.3 mg/dL   Total Protein 6.1 (L) 6.5 - 8.1 g/dL   Albumin 2.4 (L) 3.5 - 5.0 g/dL   AST 19 15 - 41 U/L   ALT 16 0 - 44 U/L   Alkaline Phosphatase 138 (H) 38 - 126 U/L   Total Bilirubin 0.7 0.3 - 1.2 mg/dL   GFR, Estimated >60 >60 mL/min     Comment: (NOTE) Calculated using the CKD-EPI Creatinine Equation (2021)    Anion gap 10 5 - 15    Comment: Performed at [facility], [place] [address], [place], [place] [address]  Magnesium     Status: None   Collection Time: 08/15/21  4:40 AM  Result Value Ref Range   Magnesium 2.2 1.7 - 2.4 mg/dL    Comment: Performed at [facility], [place] [address] [place], [place] [address]  Phosphorus     Status: None   Collection Time: 08/15/21  4:40 AM  Result Value Ref Range   Phosphorus 4.3 2.5 - 4.6 mg/dL    Comment: Performed at [facility], [place] [address], [place], [place] [address]  Glucose, capillary     Status: Abnormal   Collection Time: 08/15/21  8:08 AM  Result Value Ref Range   Glucose-Capillary 113 (H) 70 - 99 mg/dL    Comment: Glucose reference range applies only to samples taken after fasting for at least 8 hours.   Comment 1 Notify RN    Comment 2 Document in Chart   Glucose, capillary     Status: Abnormal   Collection Time: 08/15/21 11:40 AM  Result Value Ref Range   Glucose-Capillary 186 (H) 70 - 99 mg/dL    Comment: Glucose reference range applies only to samples taken after fasting for at least 8 hours.   Comment 1 Notify RN    Comment 2 Document in Chart   Glucose, capillary     Status: Abnormal   Collection Time: 08/15/21  6:19 PM  Result Value Ref Range   Glucose-Capillary 251 (H) 70 - 99 mg/dL    Comment: Glucose reference range applies only to samples taken after fasting for at least 8 hours.   Comment 1 Notify RN    Comment 2 Document in Chart   Glucose, capillary     Status: Abnormal   Collection Time: 08/15/21 10:05 PM  Result Value Ref Range   Glucose-Capillary 360 (H) 70 - 99 mg/dL  Comment: Glucose reference range applies only to samples taken after fasting for at least 8 hours.  Heparin level (unfractionated)     Status: None   Collection Time: 08/16/21  4:30 AM  Result Value Ref Range    Heparin Unfractionated 0.54 0.30 - 0.70 IU/mL    Comment: (NOTE) The clinical reportable range upper limit is being lowered to >1.10 to align with the FDA approved guidance for the current laboratory assay.  If heparin results are below expected values, and patient dosage has  been confirmed, suggest follow up testing of antithrombin III levels. Performed at [facility], [place] [address] [place], [place] [address]   CBC     Status: Abnormal   Collection Time: 08/16/21  4:31 AM  Result Value Ref Range   WBC 12.8 (H) 4.0 - 10.5 K/uL   RBC 4.35 3.87 - 5.11 MIL/uL   Hemoglobin 13.4 12.0 - 15.0 g/dL   HCT 41.0 36.0 - 46.0 %   MCV 94.3 80.0 - 100.0 fL   MCH 30.8 26.0 - 34.0 pg   MCHC 32.7 30.0 - 36.0 g/dL   RDW 15.0 11.5 - 15.5 %   Platelets 315 150 - 400 K/uL   nRBC 0.2 0.0 - 0.2 %    Comment: Performed at [facility], [place] [address] [place], [place] [address]  Glucose, capillary     Status: None   Collection Time: 08/16/21  7:57 AM  Result Value Ref Range   Glucose-Capillary 98 70 - 99 mg/dL    Comment: Glucose reference range applies only to samples taken after fasting for at least 8 hours.     Allergies:  Allergies  Allergen Reactions   Tetracycline Hives    Patient tolerated Doxycycline Dec 2020   Niacin Other (See Comments)    Mouth blisters   Niaspan [Niacin Er] Other (See Comments)    Mouth blisters   Sulfa Antibiotics Nausea Only and Other (See Comments)    "Tears up stomach"   Sulfonamide Derivatives Other (See Comments)    Reaction: per patient "tears her stomach up"   Codeine Nausea And Vomiting   Erythromycin Nausea And Vomiting   Hydromorphone Hcl Nausea And Vomiting   Morphine And Related Nausea And Vomiting   Nalbuphine Nausea And Vomiting    Nubain   Sulfasalazine Nausea Only and Other (See Comments)    per patient "tears her stomach up", "Tears up stomach"   Tape Rash and Other (See Comments)    No "plastic"  tape," please----cloth tape only    Current Medications:  Prior to Admission medications   Medication Sig Start Date End Date Taking? Authorizing Provider  acetaminophen (TYLENOL) 500 MG tablet Take 1,000 mg by mouth daily.   Yes [provider]  albuterol (VENTOLIN HFA) 108 (90 Base) MCG/ACT inhaler TAKE 2 PUFFS BY MOUTH EVERY 6 HOURS AS NEEDED FOR WHEEZE OR SHORTNESS OF BREATH Patient taking differently: 2 puffs every 6 (six) hours as needed for wheezing or shortness of breath. 02/10/21  Yes Byrum, Rose Fillers, MD  apixaban (ELIQUIS) 5 MG TABS tablet Take 1 tablet (5 mg total) by mouth 2 (two) times daily. APPOINTMENT NEEDED WITH CARDIOLOGIST FOR FURTHER REFILLS Patient taking differently: Take 5 mg by mouth 2 (two) times daily. 04/17/21  Yes Sherran Needs, NP  azaTHIOprine (IMURAN) 50 MG tablet Take 125 mg by mouth See admin instructions. Take 2 1/2 tablets (125 mg) by mouth daily at 3pm   Yes [provider]  budesonide (PULMICORT) 0.5 MG/2ML nebulizer solution Take 2 mLs (0.5 mg total) by nebulization 2 (two) times daily. 08/24/19  Yes [name], MD  calcitRIOL (ROCALTROL) 0.25 MCG capsule Take 0.25 mcg by mouth every 3 (three) days.    Yes [provider]  cetirizine (ZYRTEC) 5 MG tablet Take 5 mg by mouth daily.   Yes [provider]  clopidogrel (PLAVIX) 75 MG tablet Take 1 tablet (75 mg total) by mouth daily. 02/22/21  Yes Reino Bellis B, NP  cyclobenzaprine (FLEXERIL) 10 MG tablet TAKE 2 TAB DAILY AT BEDTIME, MAY ALSO TAKE 1 TABLET BY MOUTH AT NOON AS NEEDED FOR MUSCLE SPASMS 04/18/21  Yes Meredith Staggers, MD  fluticasone (FLONASE) 50 MCG/ACT nasal spray PLACE 2 SPRAYS INTO BOTH NOSTRILS 2 TIMES DAILY. Patient taking differently: 2 sprays 2 (two) times daily as needed for allergies. 06/27/21  Yes Collene Gobble, MD  glimepiride (AMARYL) 4 MG tablet Take 4 mg by mouth daily. 02/03/20  Yes [provider]  insulin aspart (NOVOLOG FLEXPEN)  100 UNIT/ML FlexPen Inject 4-10 Units into the skin 3 (three) times daily with meals. Per sliding scale :not provided   Yes [provider]  Insulin Glargine (BASAGLAR KWIKPEN) 100 UNIT/ML SOPN Inject 12 Units into the skin daily before breakfast.   Yes [provider]  isosorbide mononitrate (IMDUR) 60 MG 24 hr tablet Take 1 tablet (60 mg total) by mouth daily. 03/22/21  Yes Debbe Odea, MD  lamoTRIgine (LAMICTAL) 25 MG tablet TAKE 1 TABLET BY MOUTH EVERYDAY AT BEDTIME Patient taking differently: Take 25 mg by mouth at bedtime. 07/25/21  Yes Meredith Staggers, MD  LORazepam (ATIVAN) 1 MG tablet Take 1 tablet (1 mg total) by mouth 2 (two) times daily. 03/20/21  Yes [name], MD  metFORMIN (GLUCOPHAGE) 500 MG tablet Take 1 tablet (500 mg total) by mouth 2 (two) times daily. 02/22/21  Yes Burnell Blanks, MD  metoprolol tartrate (LOPRESSOR) 100 MG tablet Take 1 tablet (100 mg total) by mouth 2 (two) times daily. Appointment Required For Further Refills [phone] 03/22/21  Yes Rizwan, Eunice Blase, MD  montelukast (SINGULAIR) 10 MG tablet TAKE 1 TABLET BY MOUTH EVERYDAY AT BEDTIME Patient taking differently: Take 10 mg by mouth at bedtime. 06/23/21  Yes Collene Gobble, MD  nitroGLYCERIN (NITROSTAT) 0.4 MG SL tablet Place 1 tablet (0.4 mg total) under the tongue every 5 (five) minutes as needed for chest pain. 02/21/21  Yes Burnell Blanks, MD  Oxycodone HCl 10 MG TABS Take 1 tablet (10 mg total) by mouth 4 (four) times daily as needed. Patient taking differently: Take 10 mg by mouth 4 (four) times daily as needed (pain). 07/24/21  Yes Bayard Hugger, NP  OXYGEN Inhale 3 L into the lungs continuous.   Yes [provider]  pantoprazole (PROTONIX) 40 MG tablet TAKE 1 TABLET BY MOUTH EVERY DAY Patient taking differently: 40 mg daily. 07/31/21  Yes [name], MD  predniSONE (DELTASONE) 5 MG tablet Take 5 mg by mouth every morning. 06/19/19  Yes [provider]  promethazine (PHENERGAN) 25 MG tablet Take 25 mg by mouth every 6 (six) hours as needed for nausea or vomiting.   Yes [provider]  rosuvastatin (CRESTOR) 20 MG tablet TAKE 1 TABLET BY MOUTH EVERY DAY Patient taking differently: Take 20 mg by mouth daily. 06/16/21  Yes [name], MD  sertraline (ZOLOFT) 100 MG tablet Take 1 tablet (100 mg  total) by mouth daily. 06/06/21  Yes [name], MD  arformoterol (BROVANA) 15 MCG/2ML NEBU Take 2 mLs (15 mcg total) by nebulization 2 (two) times daily. 08/30/19   Collene Gobble, MD  clotrimazole (MYCELEX) 10 MG troche Take 1 tablet (10 mg total) by mouth 5 (five) times daily as needed (thrush). Patient not taking: Reported on 06/19/2021 02/03/21   Edwin Dada, MD  magnesium oxide (MAG-OX) 400 (240 Mg) MG tablet Take 1 tablet (400 mg total) by mouth daily. Patient not taking: Reported on 05/07/2021 04/23/21   Flora Lipps, MD  Respiratory Therapy Supplies (NEBULIZER) DEVI Pt needs nebulizr machine dx J44.9 07/17/21   Collene Gobble, MD    Social History:    reports that she has been smoking cigarettes. She has a 30.00 pack-year smoking history. She has never used smokeless tobacco. She reports that she does not drink alcohol and does not use drugs.   Family History:    Family History  Problem Relation Age of Onset   Cancer Mother        liver   Heart disease Father    Cancer Father        colon   Arrhythmia Brother        Atrial Fibrillation   Arrhythmia Paternal Aunt        Atrial Fibrillation    Psychiatric Specialty Exam: Physical Exam Pulmonary:     Comments: On nasal cannula Neurological:     Mental Status: She is alert and oriented to person, place, and time.    Review of Systems  Constitutional:        Tired  Respiratory:  Positive for shortness of breath.   Psychiatric/Behavioral:  Positive for decreased concentration, dysphoric mood and sleep disturbance. Negative for  hallucinations, self-injury and suicidal ideas. The patient is nervous/anxious. The patient is not hyperactive.    Blood pressure (!) 107/92, pulse 98, temperature 98.7 F (37.1 C), temperature source Axillary, resp. rate (!) 24, height _0  (1.549 m), weight 86.9 kg, SpO2 91 %.Body mass index is 36.2 kg/m.  General Appearance: Casual and lying on hospital bed. On oxygen  Eye Contact:  Fair  Speech:  Slow and soft  Volume:  Decreased  Mood:  Anxious, Depressed, and Dysphoric  Affect:  Constricted and Depressed  Thought Process:  Goal Directed  Orientation:  Full (Time, Place, and Person)  Thought Content:  Rumination  Suicidal Thoughts:  No  Homicidal Thoughts:  No  Memory:  Immediate;   Fair Recent;   Fair Remote;   Fair  Judgement:  Fair  Insight:  Present  Psychomotor Activity:  Decreased  Concentration:  Concentration: Fair and Attention Span: Fair  Recall:  [employer] of Knowledge:  Fair  Language:  Fair  Akathisia:  No  Handed:  Right  AIMS (if indicated):     Assets:  Communication Skills Desire for Improvement Housing  ADL's:    Cognition: fair, alert and oriented  Sleep:   fair         Assessment: Patient is 65 year old divorced female with multiple health issues, current symptoms chronic hypoxia, COVID PCR positive, extreme tiredness and fatigue with history of depression and anxiety.  Patient symptoms intensify due to prolonged hospitalization but patient denies any suicidal plan, psychosis, hallucination.  Recommendation; Increase Zoloft 150 mg daily if medically not contraindicated. Patient has been on Ativan 1 mg 3 times a day but dose decrease due to decreased respiration and breathing.  Recommended to  consider starting low-dose Ativan 0.5 mg twice a day if her breathing and respiration does not drop to help anxiety.  If increased dose of Zoloft 150 mg did not help, consider adding low-dose Abilify 2 mg to augment the Zoloft.   Encourage her to  participate in therapeutic milieu. Patient does not meet criteria for inpatient services. Psychiatry service: Continue to follow to monitor the progress.   Berniece Andreas MD Psychiatry

## 2021-08-16 NOTE — Progress Notes (Signed)
ANTICOAGULATION CONSULT NOTE  Pharmacy Consult for Heparin Indication: chest pain/ACS  Allergies  Allergen Reactions   Tetracycline Hives    Patient tolerated Doxycycline Dec 2020   Niacin Other (See Comments)    Mouth blisters   Niaspan [Niacin Er] Other (See Comments)    Mouth blisters   Sulfa Antibiotics Nausea Only and Other (See Comments)    "Tears up stomach"   Sulfonamide Derivatives Other (See Comments)    Reaction: per patient "tears her stomach up"   Codeine Nausea And Vomiting   Erythromycin Nausea And Vomiting   Hydromorphone Hcl Nausea And Vomiting   Morphine And Related Nausea And Vomiting   Nalbuphine Nausea And Vomiting    Nubain   Sulfasalazine Nausea Only and Other (See Comments)    per patient "tears her stomach up", "Tears up stomach"   Tape Rash and Other (See Comments)    No "plastic" tape," please----cloth tape only    Patient Measurements: Height: 5\' 1"  (154.9 cm) Weight: 86.9 kg (191 lb 9.3 oz) IBW/kg (Calculated) : 47.8 Heparin Dosing Weight: 66kg  Vital Signs: Temp: 98.3 F (36.8 C) (12/31 0000) Temp Source: Oral (12/31 0000) BP: 102/72 (12/31 0400) Pulse Rate: 92 (12/31 0300)  Labs: Recent Labs    08/14/21 0409 08/14/21 0410 08/14/21 0410 08/14/21 1845 08/15/21 0440 08/16/21 0430 08/16/21 0431  HGB  --  12.5   < >  --  13.2  --  13.4  HCT  --  36.8  --   --  39.9  --  41.0  PLT  --  311  --   --  308  --  315  HEPARINUNFRC 0.46  --   --   --  0.47 0.54  --   CREATININE  --   --   --   --  0.92  --   --   TROPONINIHS  --   --   --  351*  --   --   --    < > = values in this interval not displayed.      Medications:  Infusions:   heparin 700 Units/hr (08/16/21 0200)    Assessment: 65 yoF, Covid +, requiring high O2 administration. Complaint of chest pain, Troponin elevated.  PTA Apixaban for Afib transitioned to heparin infusion in the setting of NSTEMI.   - Last dose Apixaban 12/24 at 0900.  Note that Apixaban will  artificially elevate Hep levels, will dose based on aPTT until both levels correlate Troponin: 16, 150, 928, 2835, peak at 4125 >> now downtrending  Heparin level falsely elevated due to recent apixaban.  Today, 08/16/21 - Heparin level therapeutic at 0.54 - CBC WNL - no bleeding noted - Originally planned for cardiac cath either 12/29 or 12/30; however this is dependent on if O2 requirements improve. May be pushed to next week. Will need to remain on heparin per cardiology  Goal of Therapy:  Heparin level 0.3-0.7 units/ml Monitor platelets by anticoagulation protocol: Yes   Plan:  Continue heparin drip at 700 units/hour Daily heparin level and CBC Continue to monitor H&H and platelets F/u cardiac cath plans and if/when heparin should be held F/u ability to transition back to [place] 08/16/2021, 5:03 AM

## 2021-08-17 DIAGNOSIS — J9621 Acute and chronic respiratory failure with hypoxia: Secondary | ICD-10-CM | POA: Diagnosis not present

## 2021-08-17 LAB — GLUCOSE, CAPILLARY
Glucose-Capillary: 336 mg/dL — ABNORMAL HIGH (ref 70–99)
Glucose-Capillary: 220 mg/dL — ABNORMAL HIGH (ref 70–99)
Glucose-Capillary: 215 mg/dL — ABNORMAL HIGH (ref 70–99)
Glucose-Capillary: 329 mg/dL — ABNORMAL HIGH (ref 70–99)

## 2021-08-17 LAB — CBC
HCT: 38.4 % (ref 36.0–46.0)
Hemoglobin: 12.7 g/dL (ref 12.0–15.0)
MCH: 31.7 pg (ref 26.0–34.0)
MCHC: 33.1 g/dL (ref 30.0–36.0)
MCV: 95.8 fL (ref 80.0–100.0)
Platelets: 249 10*3/uL (ref 150–400)
RBC: 4.01 MIL/uL (ref 3.87–5.11)
RDW: 15.4 % (ref 11.5–15.5)
WBC: 9.8 10*3/uL (ref 4.0–10.5)
nRBC: 0 % (ref 0.0–0.2)

## 2021-08-17 LAB — BASIC METABOLIC PANEL
Anion gap: 7 (ref 5–15)
BUN: 56 mg/dL — ABNORMAL HIGH (ref 8–23)
CO2: 27 mmol/L (ref 22–32)
Calcium: 8.6 mg/dL — ABNORMAL LOW (ref 8.9–10.3)
Chloride: 95 mmol/L — ABNORMAL LOW (ref 98–111)
Creatinine, Ser: 1.03 mg/dL — ABNORMAL HIGH (ref 0.44–1.00)
GFR, Estimated: >60 mL/min (ref >60)
Glucose, Bld: 337 mg/dL — ABNORMAL HIGH (ref 70–99)
Potassium: 3.4 mmol/L — ABNORMAL LOW (ref 3.5–5.1)
Sodium: 129 mmol/L — ABNORMAL LOW (ref 135–145)

## 2021-08-17 LAB — HEPARIN LEVEL (UNFRACTIONATED): Heparin Unfractionated: 0.56 IU/mL (ref 0.30–0.70)

## 2021-08-17 MED ORDER — INSULIN GLARGINE-YFGN 100 UNIT/ML ~~LOC~~ SOLN
15.0000 [IU] | Freq: Every day | SUBCUTANEOUS | Status: DC
  Administered 2021-08-18 – 2021-08-23 (×6): 15 [IU] via SUBCUTANEOUS
  Filled 2021-08-17 (×8): qty 0.15

## 2021-08-17 MED ORDER — INSULIN ASPART 100 UNIT/ML IJ SOLN
8.0000 [IU] | Freq: Three times a day (TID) | INTRAMUSCULAR | Status: DC
  Administered 2021-08-17 – 2021-08-25 (×18): 8 [IU] via SUBCUTANEOUS

## 2021-08-17 MED ORDER — INSULIN GLARGINE-YFGN 100 UNIT/ML ~~LOC~~ SOLN
5.0000 [IU] | SUBCUTANEOUS | Status: AC
  Administered 2021-08-17: 5 [IU] via SUBCUTANEOUS
  Filled 2021-08-17: qty 0.05

## 2021-08-17 MED ORDER — NYSTATIN 100000 UNIT/GM EX POWD
Freq: Three times a day (TID) | CUTANEOUS | Status: DC
  Administered 2021-08-25: 1 via TOPICAL
  Filled 2021-08-17 (×2): qty 15

## 2021-08-17 MED ORDER — POTASSIUM CHLORIDE CRYS ER 20 MEQ PO TBCR
40.0000 meq | EXTENDED_RELEASE_TABLET | Freq: Two times a day (BID) | ORAL | Status: AC
  Administered 2021-08-17 (×2): 40 meq via ORAL
  Filled 2021-08-17 (×2): qty 2

## 2021-08-17 NOTE — Progress Notes (Signed)
ANTICOAGULATION CONSULT NOTE  Pharmacy Consult for Heparin Indication: chest pain/ACS  Allergies  Allergen Reactions   Tetracycline Hives    Patient tolerated Doxycycline Dec 2020   Niacin Other (See Comments)    Mouth blisters   Niaspan [Niacin Er] Other (See Comments)    Mouth blisters   Sulfa Antibiotics Nausea Only and Other (See Comments)    "Tears up stomach"   Sulfonamide Derivatives Other (See Comments)    Reaction: per patient "tears her stomach up"   Codeine Nausea And Vomiting   Erythromycin Nausea And Vomiting   Hydromorphone Hcl Nausea And Vomiting   Morphine And Related Nausea And Vomiting   Nalbuphine Nausea And Vomiting    Nubain   Sulfasalazine Nausea Only and Other (See Comments)    per patient "tears her stomach up", "Tears up stomach"   Tape Rash and Other (See Comments)    No "plastic" tape," please----cloth tape only    Patient Measurements: Height: 5\' 1"  (154.9 cm) Weight: 87 kg (191 lb 12.8 oz) IBW/kg (Calculated) : 47.8 Heparin Dosing Weight: 66kg  Vital Signs: Temp: 98.4 F (36.9 C) (01/01 0400) Temp Source: Oral (01/01 0400) BP: 130/73 (01/01 0400) Pulse Rate: 91 (01/01 0251)  Labs: Recent Labs    08/14/21 1845 08/15/21 0440 08/15/21 0440 08/16/21 0430 08/16/21 0431 08/16/21 0600 08/17/21 0550  HGB  --  13.2   < >  --  13.4  --  12.7  HCT  --  39.9  --   --  41.0  --  38.4  PLT  --  308  --   --  315  --  249  HEPARINUNFRC  --  0.47  --  0.54  --   --  0.56  CREATININE  --  0.92  --   --   --  0.82  --   TROPONINIHS 351*  --   --   --   --   --   --    < > = values in this interval not displayed.      Medications:  Infusions:   heparin 700 Units/hr (08/16/21 2255)    Assessment: 65 yoF, Covid +, requiring high O2 administration. Complaint of chest pain, Troponin elevated.  PTA Apixaban for Afib transitioned to heparin infusion in the setting of NSTEMI.   - Last dose Apixaban 12/24 at 0900.  Note that Apixaban will  artificially elevate Hep levels, will dose based on aPTT until both levels correlate Troponin: 16, 150, 928, 2835, peak at 4125 >> now downtrending  Heparin level falsely elevated due to recent apixaban.  Today, 08/17/21 - Heparin level therapeutic at 0.56 - CBC WNL - no bleeding reported - Originally planned for cardiac cath either 12/29 or 12/30; however this is dependent on if O2 requirements improve. May be pushed to next week. Will need to remain on heparin per cardiology  Goal of Therapy:  Heparin level 0.3-0.7 units/ml Monitor platelets by anticoagulation protocol: Yes   Plan:  Continue heparin drip at 700 units/hour Daily heparin level and CBC Continue to monitor H&H and platelets F/u cardiac cath plans and if/when heparin should be held F/u ability to transition back to [place], PharmD, [place]: [phone] 08/17/2021, 6:41 AM

## 2021-08-17 NOTE — Progress Notes (Signed)
PROGRESS NOTE    Katherine [name]  JYN:[id number] DOB: [birth year] DOA: 08/04/2021 PCP: [name], MD  Chief Complaint  Patient presents with   generalized weakness    Brief Narrative:  This is a 66 year old female with anxiety, depression, CAD/CABG, chronic hypoxic respiratory failure related to COPD on 3 L of oxygen at home, type 2 diabetes mellitus, end-stage renal disease status post renal transplant, GERD, obesity, hypertension paroxysmal atrial fibrillation, PAD who presented to the hospital on 12/19 with shortness of breath and generalized weakness.  She was found to have a pulse ox of 84% on her usual oxygen, noted to be wheezing and had a cough with yellow sputum.  COVID PCR noted to be positive.  She's been admitted for COVID 19 pneumonia and now found to have MRSA pneumonia.  She's had progressively worsening O2 needs, now on heated high flow.    See below for additional details   Seen by psychiatry on 08/16/2021 due to concern for polypharmacy.  Ativan dose reduced to 12.5 mg twice daily as needed for anxiety.  Continue Zoloft 100 mg daily, appreciate psychiatry's assistance.  08/17/2021: Patient seen and examined at bedside.  She is in her bed.  No new complaints.  Weaning off oxygen supplementation as tolerated.   Assessment & Plan:   Principal Problem:   Acute on chronic respiratory failure with hypoxia (HCC) Active Problems:   Hypomagnesemia   Dyslipidemia, goal LDL below 70   NSTEMI (non-ST elevated myocardial infarction) (HCC)   CAD S/P percutaneous coronary angioplasty - PCI x 5 to SVG-D1   Essential hypertension   PAD (peripheral artery disease) (HCC)   Paroxysmal atrial fibrillation (HCC); CHA2DS2VASc score F, HTN, CAD, CVA = 5   OSA (obstructive sleep apnea)   GAD (generalized anxiety disorder)   Acute CHF (congestive heart failure) ([place])   History of renal transplant   Uncontrolled type 2 diabetes mellitus with hyperglycemia, 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  Possible contribution from HF. Uses 2 LPM at home COVID test + on 12/18 CXR 12/24 with worsening severe diffuse bilateral airspace disease CT PE protocol chest 08/05/2021: limited for PE, but negative, extensive heterogenous and GGO throughout lungs  Satting in 90's, on 25 L HHFNC (slowly improving) Elevated BNP -> lasix as tolerated Completed remdesivir.  Continue IV steroids for covid and abx to cover for MRSA pneumonia She's immunosuppressed on antirejection meds with renal transplant (not candidate for baricitinib or actemra with MRSA infection and immunosuppression).  Poor prognosis with immunosuppression and covid.  COVID-19 Labs  Recent Labs    08/10/21 0515 08/11/21 0309  DDIMER 2.01* 1.43*  CRP  --  2.4*    COVID-19 Labs  Recent Labs    08/10/21 0515 08/11/21 0309  DDIMER 2.01* 1.43*  CRP  --  2.4*    Lab Results  Component Value Date   SARSCOV2NAA POSITIVE (A) 08/04/2021   SARSCOV2NAA NEGATIVE 05/12/2021   [place] NEGATIVE 04/19/2021   [place] NEGATIVE 03/30/2021     Positive D dimer On anticoagulation Follow LE [place] - negative  MRSA pneumonia ([place]) 12/20 sputum cx with MRSA (and candida  albicans - suspect this is contaminant/colonizer)  On doxycycline, plan for 7 days (12/23 - present) As above  Chronic diastolic CHF (congestive heart failure) ([place])- (present on admission) Grade 1 diastolic dysfunction  Obesity (BMI 30-39.9)- (present on admission) Body mass index is 37.03 kg/m.  Placing the patient at high risk of poor outcome.  Monitor.  COVID-19 virus infection- (present on  admission) As above Steroids, remdesivir (s/p 5 doses) Tapering steroids  Pressure injury of skin- (present on admission) Stage II, medial rectal ulcer. Present on admission. 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 Receives prednisone and Imuran. Placing the patient at high risk for poor outcome secondary to chronic immunosuppressive status  Acute CHF (congestive heart failure) (HCC) Elevated BNP Follow echo - EF 45-50%, RWMA (see report) Lasix as tolerated, follow renal function Strict I/O, daily weights - net negative  GAD (generalized anxiety disorder)- (present on admission) Continue Zoloft and Ativan.  OSA (obstructive sleep apnea)- (present on admission) Does not use CPAP at night after lap band.  Paroxysmal atrial fibrillation (HCC); CHA2DS2VASc score F, HTN, CAD, CVA = 5- (present on admission) Continue Metoprolol (dose reduced due to brady) and Eliquis -> now on heparin gtt  PAD (peripheral artery disease) ([place])- (present on admission) Prior history.  Monitor.  Essential hypertension Recently improved, follow Continue home BP meds, consider adding meds pending trend  CAD S/P percutaneous coronary angioplasty - PCI x 5 to SVG-D1 Prior history.  Monitor. W/u for chest pain above  NSTEMI (non-ST elevated myocardial infarction) ([place]) Episode of CP similar to prior heart attacks on 12/24 Continue nitro prn  Was already anticoagulated on eliquis, antiplatelet on plavix -> given concerning symptoms/rising troponin -> transitioned to heparin gtt, aspirin added to placix EF 45-50%, RWMA's (see report) Cardiology c/s, appreciate assistance -> plan for aspirin, plavix, imdur  (increased dose), lopressor, crestor - consider arb/arni if BP remains high, consider cath pending clinical status (note 12/25) Would want her O2 needs to improve prior to diagnostic cath - still has significant O2 needs Ok to work with PT/OT if asymptomatic  Dyslipidemia, goal LDL below 70- (present on admission) Continue statin.  Hypomagnesemia- (present on admission) Replaced.  Worsening hyponatremia, serum sodium 129 from 135. Resolved Hypomagnesemia, serum mag 2.2. Worsening hyperglycemia, likely exacerbated by steroids.  Increase Semglee dose to 15 unit daily, NovoLog 8 units 3 times daily.  Hypercarbia is improving  Persistent Generalized weakness PT OT assessed and recommended home health PT OT Continue fall precautions Patient encouraged to participate with therapies Continue PT OT  MRSA pneumonia Personally reviewed last chest x-ray showing improvement of infiltrates. Completed course of IV doxycycline on 08/15/2021. Repeated procalcitonin level less than 0.10   Refractory hypokalemia K+ 3.3>> 4.1> 3.4. Repleted orally  Chronic anxiety/depression with concern for polypharmacy Seen by psychiatry, appreciate recommendations.   DVT prophylaxis: Heparin gtt (previously eliquis) Code Status: Full Family Communication: none at bedside - discussed with niece 12/24, no answer 12/25 Disposition:   Status is: Inpatient  Remains inpatient appropriate because: sig O2 requirement, NSTEMI       Consultants:  cardiology  Procedures:  echo IMPRESSIONS     1. Left ventricular ejection fraction, by estimation, is 45 to 50%. The  left ventricle has mildly decreased function. The left  ventricle  demonstrates regional wall motion abnormalities (see scoring  diagram/findings for description). There is mild left  ventricular hypertrophy. Left ventricular diastolic function could not be  evaluated.   2. Right ventricular systolic function is normal. The right  ventricular  size is normal.   3. The mitral valve is grossly normal. Trivial mitral valve  regurgitation.   4. The aortic valve has an indeterminant number of cusps. There is  moderate calcification of the aortic valve. Aortic valve regurgitation is  not visualized. Aortic valve sclerosis/calcification is present, without  any evidence of aortic stenosis.   Comparison(s): Prior images reviewed side by side. Wall motion more  similar to last study with Definity contrast on 07/12/2020, although LVEF  somewhat better.   Summary:  BILATERAL:  -No evidence of popliteal cyst, bilaterally.      Antimicrobials:  Anti-infectives (From admission, onward)    Start     Dose/Rate Route Frequency Ordered Stop   08/08/21 1400  doxycycline (VIBRAMYCIN) 100 mg in sodium chloride 0.9 % 250 mL IVPB        100 mg 125 mL/hr over 120 Minutes Intravenous Every 12 hours 08/08/21 1327 08/15/21 1046   08/06/21 1000  remdesivir 100 mg in sodium chloride 0.9 % 100 mL IVPB  Status:  Discontinued       See Hyperspace for full Linked Orders Report.   100 mg 200 mL/hr over 30 Minutes Intravenous Daily 08/05/21 0720 08/05/21 0720   08/06/21 1000  remdesivir 100 mg in sodium chloride 0.9 % 100 mL IVPB        100 mg 200 mL/hr over 30 Minutes Intravenous Daily 08/05/21 0721 08/09/21 0937   08/05/21 1000  remdesivir 100 mg in sodium chloride 0.9 % 100 mL IVPB  Status:  Discontinued       See Hyperspace for full Linked Orders Report.   100 mg 200 mL/hr over 30 Minutes Intravenous Daily 08/04/21 0819 08/05/21 0720   08/05/21 1000  remdesivir 200 mg in sodium chloride 0.9% 250 mL IVPB        200 mg 580 mL/hr over 30 Minutes Intravenous Once 08/05/21 0719 08/05/21 1150   08/05/21 1000  cefTRIAXone (ROCEPHIN) 2 g in sodium chloride 0.9 % 100 mL IVPB        2 g 200 mL/hr over 30 Minutes Intravenous Every 24 hours 08/05/21 0815 08/09/21 1032   08/05/21 1000  azithromycin (ZITHROMAX) 500 mg in sodium chloride 0.9 %  250 mL IVPB  Status:  Discontinued        500 mg 250 mL/hr over 60 Minutes Intravenous Every 24 hours 08/05/21 0815 08/05/21 0907   08/04/21 0930  remdesivir 200 mg in sodium chloride 0.9% 250 mL IVPB  Status:  Discontinued       See Hyperspace for full Linked Orders Report.   200 mg 580 mL/hr over 30 Minutes Intravenous Once 08/04/21 0819 08/05/21 0720        Objective: Vitals:   08/17/21 0900 08/17/21 1000 08/17/21 1042 08/17/21 1157  BP:  124/88    Pulse: 89 100 (!) 103   Resp: (!) 23 20 16    Temp:      TempSrc:      SpO2: 97%  94% 94%  Weight:      Height:        Intake/Output Summary (Last 24 hours) at 08/17/2021 1202 Last data filed at 08/17/2021 0800 Gross per 24 hour  Intake 208.98 ml  Output 2650 ml  Net -2441.02  ml   Filed Weights   08/15/21 0441 08/16/21 0407 08/17/21 0500  Weight: 86.9 kg 86.9 kg 87 kg    Examination:  General: Frail-appearing no acute distress.  She is alert and oriented x3.   Cardiovascular: Regular rate and rhythm no rubs or gallops.   Lungs: Clear to auscultation no wheezes or rales.  Poor inspiratory effort.   Abdomen: Soft nontender normal bowel sounds present.   Neurological: Nonfocal exam. Skin: No rashes or ulcerative lesions noted. Extremities: No lower extremity edema bilaterally.  Data Reviewed: I have personally reviewed following labs and imaging studies  CBC: Recent Labs  Lab 08/11/21 0309 08/12/21 0312 08/14/21 0410 08/15/21 0440 08/16/21 0431 08/17/21 0550  WBC 8.4 8.0 9.5 12.1* 12.8* 9.8  NEUTROABS 7.9* 7.3  --   --   --   --   HGB 11.6* 11.1* 12.5 13.2 13.4 12.7  HCT 35.0* 33.1* 36.8 39.9 41.0 38.4  MCV 96.2 93.5 94.6 94.8 94.3 95.8  PLT 332 318 311 308 315 630    Basic Metabolic Panel: Recent Labs  Lab 08/11/21 0309 08/12/21 0312 08/13/21 0410 08/15/21 0440 08/16/21 0600 08/17/21 0550  NA 137 133* 131* 135 132* 129*  K 4.3 4.1 3.6 3.3* 4.1 3.4*  CL 98 96* 92* 94* 94* 95*  CO2 30 28 29 31 29 27    GLUCOSE 211* 249* 189* 73 128* 337*  BUN 41* 44* 47* 58* 62* 56*  CREATININE 0.87 0.93 0.89 0.92 0.82 1.03*  CALCIUM 8.7* 8.8* 9.1 9.4 9.0 8.6*  MG 2.2 1.9 1.9 2.2  --   --   PHOS 3.1 3.1 3.5 4.3  --   --     GFR: Estimated Creatinine Clearance: 55.3 mL/min (A) (by C-G formula based on SCr of 1.03 mg/dL (H)).  Liver Function Tests: Recent Labs  Lab 08/11/21 0309 08/12/21 0312 08/13/21 0410 08/15/21 0440  AST 23 18 16 19   ALT 13 14 15 16   ALKPHOS 115 129* 138* 138*  BILITOT 0.6 0.8 0.7 0.7  PROT 6.4* 6.1* 6.1* 6.1*  ALBUMIN 2.6* 2.4* 2.4* 2.4*    CBG: Recent Labs  Lab 08/16/21 0757 08/16/21 1134 08/16/21 1648 08/16/21 2125 08/17/21 0712  GLUCAP 98 154* 129* 164* 336*     No results found for this or any previous visit (from the past 240 hour(s)).        Radiology Studies: No results found.      Scheduled Meds:  (feeding supplement) PROSource Plus  30 mL Oral BID BM   arformoterol  15 mcg Nebulization BID   vitamin C  500 mg Oral Daily   aspirin  81 mg Oral Daily   budesonide  2 mL Nebulization BID   calcitRIOL  0.25 mcg Oral Q3 days   Chlorhexidine Gluconate Cloth  6 each Topical Daily   clopidogrel  75 mg Oral Daily   feeding supplement (GLUCERNA SHAKE)  237 mL Oral TID BM   fluticasone  2 spray Each Nare Daily   furosemide  40 mg Intravenous BID   guaiFENesin-dextromethorphan  10 mL Oral QID   insulin aspart  0-15 Units Subcutaneous TID WC   insulin aspart  0-5 Units Subcutaneous QHS   insulin aspart  5 Units Subcutaneous TID WC   insulin glargine-yfgn  10 Units Subcutaneous Daily   isosorbide mononitrate  90 mg Oral Daily   lamoTRIgine  25 mg Oral QHS   linagliptin  5 mg Oral Daily   loratadine  10 mg Oral Daily  magic mouthwash  5 mL Oral QID   mouth rinse  15 mL Mouth Rinse BID   metoprolol tartrate  25 mg Oral BID   montelukast  10 mg Oral QHS   multivitamin with minerals  1 tablet Oral Daily   pantoprazole  40 mg Oral Daily    predniSONE  30 mg Oral Q breakfast   Followed by   Derrill Memo ON 08/19/2021] predniSONE  10 mg Oral Q breakfast   Followed by   Derrill Memo ON 08/21/2021] predniSONE  10 mg Oral Q breakfast   Ensure Max Protein  11 oz Oral Daily   rosuvastatin  20 mg Oral Daily   senna-docusate  2 tablet Oral BID   sertraline  100 mg Oral Daily   sodium chloride flush  10-40 mL Intracatheter Q12H   zinc sulfate  220 mg Oral Daily   Continuous Infusions:  heparin 700 Units/hr (08/17/21 0800)     LOS: 13 days    Time spent: over 30 min 40 min critical care time with AHRF 2/2 covid and mrsa pneumonia on HHFNC  Kayleen Memos, MD [facility]   To contact the attending provider between 7A-7P or the covering provider during after hours 7P-7A, please log into the web site [email or web address] and access using universal [place] password for that web site. If you do not have the password, please call the hospital operator.  08/17/2021, 12:02 PM

## 2021-08-17 NOTE — Progress Notes (Signed)
° °  Will re-visit possible cardiac cath next week. Given the holiday, not likely to be before Tuesday.    Pixie Casino, MD, [facility], [place] Director of the Advanced Lipid Disorders &  Card[facility]ovascular [facility] Diplomate of the American Board of Clinical Lipidology Attending Cardiologist  Direct Dial: [phone]   [email or web address]: [phone]  Website:  [email or web address]

## 2021-08-17 NOTE — Consult Note (Signed)
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 08/05/21  2:52 AM  Result Value Ref Range   Magnesium 2.2 1.7 - 2.4 mg/dL    Comment: Performed at [facility], [place] [address] [place], [place] [address]  Ferritin     Status: Abnormal   Collection Time: 08/05/21  2:52 AM  Result Value Ref Range   Ferritin 755 (H) 11 - 307 ng/mL    Comment: Performed at [facility], [place] [address] [place], [place] [address]  D-dimer, quantitative     Status: Abnormal   Collection Time: 08/05/21  2:52 AM  Result Value Ref Range   D-Dimer, Quant 1.83 (H) 0.00 - 0.50 ug/mL-FEU    Comment: (NOTE) At the manufacturer cut-off  value of 0.5 g/mL FEU, this assay has a negative predictive value of 95-100%.This assay is intended for use in conjunction with a clinical pretest probability (PTP) assessment model to exclude pulmonary embolism (PE) and deep venous thrombosis (DVT) in outpatients suspected of PE or DVT. Results should be correlated with clinical presentation. Performed at [facility], [place] [address], [place], [place] [address]   C-reactive protein     Status: Abnormal   Collection Time: 08/05/21  2:52 AM  Result Value Ref Range   CRP 18.6 (H) <1.0 mg/dL    Comment: Performed at [place] [address], [place], [place] [address]  Comprehensive metabolic panel     Status: Abnormal   Collection Time: 08/05/21  2:52 AM  Result Value Ref Range   Sodium 132 (L) 135 - 145 mmol/L   Potassium 4.1 3.5 - 5.1 mmol/L   Chloride 101 98 - 111 mmol/L   CO2 23 22 - 32 mmol/L   Glucose, Bld 176 (H) 70 - 99  mg/dL    Comment: Glucose reference range applies only to samples taken after fasting for at least 8 hours.   BUN 15 8 - 23 mg/dL   Creatinine, Ser 0.77 0.44 - 1.00 mg/dL   Calcium 8.5 (L) 8.9 - 10.3 mg/dL   Total Protein 6.8 6.5 - 8.1 g/dL   Albumin 2.8 (L) 3.5 - 5.0 g/dL   AST 35 15 - 41 U/L   ALT 9 0 - 44 U/L   Alkaline Phosphatase 74 38 - 126 U/L   Total Bilirubin 1.1 0.3 - 1.2 mg/dL   GFR, Estimated >60 >60 mL/min    Comment: (NOTE) Calculated using the CKD-EPI Creatinine Equation (2021)    Anion gap 8 5 - 15    Comment: Performed at [facility], [place] [address] [place], [place] [address]  CBC with Differential/Platelet     Status: Abnormal   Collection Time: 08/05/21  2:52 AM  Result Value Ref Range   WBC 5.0 4.0 - 10.5 K/uL   RBC 3.56 (L) 3.87 - 5.11 MIL/uL   Hemoglobin 11.3 (L) 12.0 - 15.0 g/dL   HCT 34.6 (L) 36.0 - 46.0 %   MCV 97.2 80.0 - 100.0 fL   MCH 31.7 26.0 - 34.0 pg   MCHC 32.7 30.0 - 36.0 g/dL   RDW 15.8 (H) 11.5 - 15.5 %    Platelets 177 150 - 400 K/uL   nRBC 0.0 0.0 - 0.2 %   Neutrophils Relative % 89 %   Neutro Abs 4.4 1.7 - 7.7 K/uL   Lymphocytes Relative 5 %   Lymphs Abs 0.3 (L) 0.7 - 4.0 K/uL   Monocytes Relative 5 %   Monocytes Absolute 0.3 0.1 - 1.0 K/uL   Eosinophils Relative 0 %   Eosinophils Absolute 0.0 0.0 - 0.5 K/uL   Basophils Relative 0 %   Basophils Absolute 0.0 0.0 - 0.1 K/uL   Immature Granulocytes 1 %   Abs Immature Granulocytes 0.03 0.00 - 0.07 K/uL    Comment: Performed at [facility], [place] [address], [place], [place] [address]  Procalcitonin - Baseline     Status: None   Collection Time: 08/05/21  2:52 AM  Result Value Ref Range   Procalcitonin 0.40 ng/mL    Comment:        Interpretation: PCT (Procalcitonin) <= 0.5 ng/mL: Systemic infection (sepsis) is not likely. Local bacterial infection is possible. (NOTE)       Sepsis PCT Algorithm           Lower Respiratory Tract                                      Infection PCT Algorithm    ----------------------------     ----------------------------         PCT < 0.25 ng/mL                PCT < 0.10 ng/mL          Strongly encourage             Strongly discourage   discontinuation of antibiotics    initiation of antibiotics    ----------------------------     -----------------------------       PCT 0.25 - 0.50 ng/mL            PCT 0.10 - 0.25 ng/mL  OR       >80% decrease in PCT            Discourage initiation of                                            antibiotics      Encourage discontinuation           of antibiotics    ----------------------------     -----------------------------         PCT >= 0.50 ng/mL              PCT 0.26 - 0.50 ng/mL               AND        <80% decrease in PCT             Encourage initiation of                                             antibiotics       Encourage continuation           of antibiotics    ----------------------------      -----------------------------        PCT >= 0.50 ng/mL                  PCT > 0.50 ng/mL               AND         increase in PCT                  Strongly encourage                                      initiation of antibiotics    Strongly encourage escalation           of antibiotics                                     -----------------------------                                           PCT <= 0.25 ng/mL                                                 OR                                        > 80% decrease in PCT                                      Discontinue / Do not initiate  antibiotics  Performed at [facility], [place] [address] [place], [place] [address]   Glucose, capillary     Status: Abnormal   Collection Time: 08/05/21  4:11 AM  Result Value Ref Range   Glucose-Capillary 169 (H) 70 - 99 mg/dL    Comment: Glucose reference range applies only to samples taken after fasting for at least 8 hours.  Glucose, capillary     Status: Abnormal   Collection Time: 08/05/21  8:07 AM  Result Value Ref Range   Glucose-Capillary 169 (H) 70 - 99 mg/dL    Comment: Glucose reference range applies only to samples taken after fasting for at least 8 hours.  Culture, blood (routine x 2) Call MD if unable to obtain prior to antibiotics being given     Status: None   Collection Time: 08/05/21 10:12 AM   Specimen: BLOOD LEFT HAND  Result Value Ref Range   Specimen Description      BLOOD LEFT HAND Performed at [place] [address], [place], [place] [address]    Special Requests      BOTTLES DRAWN AEROBIC ONLY Blood Culture results may not be optimal due to an inadequate volume of blood received in culture bottles Performed at [place] [address], [place], [place] [address]    Culture      NO GROWTH 5 DAYS Performed at [facility], [place] [address]  [place], [place] [address]    Report Status 08/10/2021 FINAL   Legionella Urine Antigen     Status: None   Collection Time: 08/05/21 10:23 AM  Result Value Ref Range   L. pneumophila Serogp 1 Ur Ag Negative Negative    Comment: (NOTE) Presumptive negative for L. pneumophila serogroup 1 antigen in urine, suggesting no recent or current infection. Legionnaires' disease cannot be ruled out since other serogroups and species may also cause disease. Performed At: [facility] [place], [place] [id number] Rush Farmer MD [id number]    Source of Sample URINE, CLEAN CATCH     Comment: Performed at [place] [address] [place], [place] [address]  Strep pneumoniae urinary antigen     Status: None   Collection Time: 08/05/21 10:23 AM  Result Value Ref Range   Strep Pneumo Urinary Antigen NEGATIVE NEGATIVE    Comment:        Infection due to S. pneumoniae cannot be absolutely ruled out since the antigen present may be below the detection limit of the test. Performed at [facility], [place] [address], [place], [place] [address]   Expectorated Sputum Assessment w Gram Stain, Rflx to Resp Cult     Status: None   Collection Time: 08/05/21 10:31 AM   Specimen: Expectorated Sputum  Result Value Ref Range   Specimen Description EXPECTORATED SPUTUM    Special Requests NONE    Sputum evaluation      Sputum specimen not acceptable for testing.  Please recollect.   RESULT CALLED TO, READ BACK BY AND VERIFIED WITH: NOTIFY Joesphine Bare RN ON 07/2021 @ 1104 BY MECIAL J. Performed at [facility], [place] [address], [place], [place] [address]    Report Status 08/05/2021 FINAL   Culture, blood (routine x 2) Call MD if unable to obtain prior to antibiotics being given     Status: None   Collection Time: 08/05/21 11:07 AM   Specimen: BLOOD RIGHT HAND  Result Value Ref Range  Specimen Description      BLOOD RIGHT HAND Performed at  [facility], [place] [address] [place], [place] [address]    Special Requests      BOTTLES DRAWN AEROBIC ONLY Blood Culture results may not be optimal due to an inadequate volume of blood received in culture bottles Performed at [place] [address] [place], [place] [address]    Culture      NO GROWTH 5 DAYS Performed at [facility], [place] [address], [place], [place] [address]    Report Status 08/10/2021 FINAL   Glucose, capillary     Status: Abnormal   Collection Time: 08/05/21 11:23 AM  Result Value Ref Range   Glucose-Capillary 235 (H) 70 - 99 mg/dL    Comment: Glucose reference range applies only to samples taken after fasting for at least 8 hours.  Expectorated Sputum Assessment w Gram Stain, Rflx to Resp Cult     Status: None   Collection Time: 08/05/21  1:29 PM   Specimen: SPU  Result Value Ref Range   Specimen Description SPUTUM    Special Requests NONE    Sputum evaluation      THIS SPECIMEN IS ACCEPTABLE FOR SPUTUM CULTURE Performed at [facility], [place] [address], [place], [place] [address]    Report Status 08/05/2021 FINAL   Culture, Respiratory w Gram Stain     Status: None   Collection Time: 08/05/21  1:29 PM   Specimen: SPU  Result Value Ref Range   Specimen Description      SPUTUM Performed at [facility], [place] [address], [place], [place] [address]    Special Requests      NONE Reflexed from [phone] Performed at [facility], [place] [address] [place], [place] [address]    Gram Stain      ABUNDANT WBC PRESENT, PREDOMINANTLY MONONUCLEAR MODERATE YEAST FEW GRAM POSITIVE RODS FEW GRAM POSITIVE COCCI Performed at [facility], [place] [address], [place], [place] [address]    Culture      MODERATE METHICILLIN RESISTANT STAPHYLOCOCCUS AUREUS FEW CANDIDA ALBICANS    Report Status 08/08/2021 FINAL    Organism ID, Bacteria METHICILLIN RESISTANT  STAPHYLOCOCCUS AUREUS       Susceptibility   Methicillin resistant staphylococcus aureus - MIC*    CIPROFLOXACIN >=8 RESISTANT Resistant     ERYTHROMYCIN >=8 RESISTANT Resistant     GENTAMICIN <=0.5 SENSITIVE Sensitive     OXACILLIN >=4 RESISTANT Resistant     TETRACYCLINE <=1 SENSITIVE Sensitive     VANCOMYCIN <=0.5 SENSITIVE Sensitive     TRIMETH/SULFA <=10 SENSITIVE Sensitive     CLINDAMYCIN >=8 RESISTANT Resistant     RIFAMPIN <=0.5 SENSITIVE Sensitive     Inducible Clindamycin NEGATIVE Sensitive     * MODERATE METHICILLIN RESISTANT STAPHYLOCOCCUS AUREUS  Glucose, capillary     Status: Abnormal   Collection Time: 08/05/21  5:48 PM  Result Value Ref Range   Glucose-Capillary 434 (H) 70 - 99 mg/dL    Comment: Glucose reference range applies only to samples taken after fasting for at least 8 hours.  Glucose, capillary     Status: Abnormal   Collection Time: 08/05/21  5:49 PM  Result Value Ref Range   Glucose-Capillary 439 (H) 70 - 99 mg/dL    Comment: Glucose reference range applies only to samples taken after fasting for at least 8 hours.  Glucose, capillary     Status: Abnormal  Collection Time: 08/05/21  9:23 PM  Result Value Ref Range   Glucose-Capillary 346 (H) 70 - 99 mg/dL    Comment: Glucose reference range applies only to samples taken after fasting for at least 8 hours.  HIV Antibody (routine testing w rflx)     Status: None   Collection Time: 08/06/21  2:52 AM  Result Value Ref Range   HIV Screen 4th Generation wRfx Non Reactive Non Reactive    Comment: Performed at [facility], [place] [address], [place], [place] [address]  Magnesium     Status: None   Collection Time: 08/06/21  2:52 AM  Result Value Ref Range   Magnesium 1.9 1.7 - 2.4 mg/dL    Comment: Performed at [facility], [place] [address], [place], [place] [address]  D-dimer, quantitative     Status: Abnormal   Collection Time: 08/06/21  2:52 AM  Result Value Ref Range   D-Dimer,  Quant 1.69 (H) 0.00 - 0.50 ug/mL-FEU    Comment: (NOTE) At the manufacturer cut-off value of 0.5 g/mL FEU, this assay has a negative predictive value of 95-100%.This assay is intended for use in conjunction with a clinical pretest probability (PTP) assessment model to exclude pulmonary embolism (PE) and deep venous thrombosis (DVT) in outpatients suspected of PE or DVT. Results should be correlated with clinical presentation. Performed at [facility], [place] [address], [place], [place] [address]   C-reactive protein     Status: Abnormal   Collection Time: 08/06/21  2:52 AM  Result Value Ref Range   CRP 9.0 (H) <1.0 mg/dL    Comment: Performed at [facility] [address], [place], [place] [address]  Comprehensive metabolic panel     Status: Abnormal   Collection Time: 08/06/21  2:52 AM  Result Value Ref Range   Sodium 131 (L) 135 - 145 mmol/L   Potassium 3.5 3.5 - 5.1 mmol/L   Chloride 96 (L) 98 - 111 mmol/L   CO2 23 22 - 32 mmol/L   Glucose, Bld 254 (H) 70 - 99 mg/dL    Comment: Glucose reference range applies only to samples taken after fasting for at least 8 hours.   BUN 19 8 - 23 mg/dL   Creatinine, Ser 0.92 0.44 - 1.00 mg/dL   Calcium 8.6 (L) 8.9 - 10.3 mg/dL   Total Protein 6.5 6.5 - 8.1 g/dL   Albumin 2.8 (L) 3.5 - 5.0 g/dL   AST 23 15 - 41 U/L   ALT 10 0 - 44 U/L   Alkaline Phosphatase 85 38 - 126 U/L   Total Bilirubin 0.5 0.3 - 1.2 mg/dL   GFR, Estimated >60 >60 mL/min    Comment: (NOTE) Calculated using the CKD-EPI Creatinine Equation (2021)    Anion gap 12 5 - 15    Comment: Performed at [facility] [place], [place] [address] [place], [place] [address]  CBC with Differential/Platelet     Status: Abnormal   Collection Time: 08/06/21  2:52 AM  Result Value Ref Range   WBC 8.6 4.0 - 10.5 K/uL   RBC 3.13 (L) 3.87 - 5.11 MIL/uL   Hemoglobin 10.1 (L) 12.0 - 15.0 g/dL   HCT 29.8 (L) 36.0 - 46.0 %   MCV 95.2 80.0 - 100.0 fL    MCH 32.3 26.0 - 34.0 pg   MCHC 33.9 30.0 - 36.0 g/dL   RDW 15.7 (H) 11.5 - 15.5 %   Platelets 218 150 -  400 K/uL   nRBC 0.2 0.0 - 0.2 %   Neutrophils Relative % 93 %   Neutro Abs 8.0 (H) 1.7 - 7.7 K/uL   Lymphocytes Relative 2 %   Lymphs Abs 0.2 (L) 0.7 - 4.0 K/uL   Monocytes Relative 4 %   Monocytes Absolute 0.3 0.1 - 1.0 K/uL   Eosinophils Relative 0 %   Eosinophils Absolute 0.0 0.0 - 0.5 K/uL   Basophils Relative 0 %   Basophils Absolute 0.0 0.0 - 0.1 K/uL   Immature Granulocytes 1 %   Abs Immature Granulocytes 0.04 0.00 - 0.07 K/uL    Comment: Performed at [facility], [place] [address] [place], [place] [address]  Glucose, capillary     Status: Abnormal   Collection Time: 08/06/21  8:10 AM  Result Value Ref Range   Glucose-Capillary 305 (H) 70 - 99 mg/dL    Comment: Glucose reference range applies only to samples taken after fasting for at least 8 hours.  Glucose, capillary     Status: Abnormal   Collection Time: 08/06/21 11:13 AM  Result Value Ref Range   Glucose-Capillary 361 (H) 70 - 99 mg/dL    Comment: Glucose reference range applies only to samples taken after fasting for at least 8 hours.  Glucose, capillary     Status: Abnormal   Collection Time: 08/06/21  4:41 PM  Result Value Ref Range   Glucose-Capillary 354 (H) 70 - 99 mg/dL    Comment: Glucose reference range applies only to samples taken after fasting for at least 8 hours.  Glucose, capillary     Status: Abnormal   Collection Time: 08/06/21  9:11 PM  Result Value Ref Range   Glucose-Capillary 258 (H) 70 - 99 mg/dL    Comment: Glucose reference range applies only to samples taken after fasting for at least 8 hours.  Magnesium     Status: None   Collection Time: 08/07/21  2:54 AM  Result Value Ref Range   Magnesium 2.1 1.7 - 2.4 mg/dL    Comment: Performed at [facility], [place] [address] [place], [place] [address]  D-dimer, quantitative     Status: Abnormal   Collection  Time: 08/07/21  2:54 AM  Result Value Ref Range   D-Dimer, Quant 1.69 (H) 0.00 - 0.50 ug/mL-FEU    Comment: (NOTE) At the manufacturer cut-off value of 0.5 g/mL FEU, this assay has a negative predictive value of 95-100%.This assay is intended for use in conjunction with a clinical pretest probability (PTP) assessment model to exclude pulmonary embolism (PE) and deep venous thrombosis (DVT) in outpatients suspected of PE or DVT. Results should be correlated with clinical presentation. Performed at [facility], [place] [address], [place], [place] [address]   C-reactive protein     Status: Abnormal   Collection Time: 08/07/21  2:54 AM  Result Value Ref Range   CRP 5.6 (H) <1.0 mg/dL    Comment: Performed at [place] [address], [place], [place] [address]  Comprehensive metabolic panel     Status: Abnormal   Collection Time: 08/07/21  2:54 AM  Result Value Ref Range   Sodium 131 (L) 135 - 145 mmol/L   Potassium 4.0 3.5 - 5.1 mmol/L   Chloride 96 (L) 98 - 111 mmol/L   CO2 25 22 - 32 mmol/L   Glucose, Bld 281 (H) 70 - 99 mg/dL    Comment: Glucose reference range applies only to samples taken  after fasting for at least 8 hours.   BUN 21 8 - 23 mg/dL   Creatinine, Ser 0.91 0.44 - 1.00 mg/dL   Calcium 8.9 8.9 - 10.3 mg/dL   Total Protein 6.8 6.5 - 8.1 g/dL   Albumin 2.9 (L) 3.5 - 5.0 g/dL   AST 29 15 - 41 U/L   ALT 9 0 - 44 U/L   Alkaline Phosphatase 111 38 - 126 U/L   Total Bilirubin 0.7 0.3 - 1.2 mg/dL   GFR, Estimated >60 >60 mL/min    Comment: (NOTE) Calculated using the CKD-EPI Creatinine Equation (2021)    Anion gap 10 5 - 15    Comment: Performed at [facility], [place] [address], [place], [place] [address]  CBC with Differential/Platelet     Status: Abnormal   Collection Time: 08/07/21  2:54 AM  Result Value Ref Range   WBC 10.7 (H) 4.0 - 10.5 K/uL   RBC 3.38 (L) 3.87 - 5.11 MIL/uL   Hemoglobin 10.8 (L) 12.0 - 15.0 g/dL    HCT 32.2 (L) 36.0 - 46.0 %   MCV 95.3 80.0 - 100.0 fL   MCH 32.0 26.0 - 34.0 pg   MCHC 33.5 30.0 - 36.0 g/dL   RDW 15.6 (H) 11.5 - 15.5 %   Platelets 250 150 - 400 K/uL   nRBC 0.0 0.0 - 0.2 %   Neutrophils Relative % 93 %   Neutro Abs 10.0 (H) 1.7 - 7.7 K/uL   Lymphocytes Relative 2 %   Lymphs Abs 0.2 (L) 0.7 - 4.0 K/uL   Monocytes Relative 4 %   Monocytes Absolute 0.5 0.1 - 1.0 K/uL   Eosinophils Relative 0 %   Eosinophils Absolute 0.0 0.0 - 0.5 K/uL   Basophils Relative 0 %   Basophils Absolute 0.0 0.0 - 0.1 K/uL   Immature Granulocytes 1 %   Abs Immature Granulocytes 0.09 (H) 0.00 - 0.07 K/uL    Comment: Performed at [facility], [place] [address] [place], [place] [address]  Glucose, capillary     Status: Abnormal   Collection Time: 08/07/21  7:25 AM  Result Value Ref Range   Glucose-Capillary 313 (H) 70 - 99 mg/dL    Comment: Glucose reference range applies only to samples taken after fasting for at least 8 hours.  Glucose, capillary     Status: Abnormal   Collection Time: 08/07/21 11:08 AM  Result Value Ref Range   Glucose-Capillary 247 (H) 70 - 99 mg/dL    Comment: Glucose reference range applies only to samples taken after fasting for at least 8 hours.  Glucose, capillary     Status: Abnormal   Collection Time: 08/07/21  4:58 PM  Result Value Ref Range   Glucose-Capillary 329 (H) 70 - 99 mg/dL    Comment: Glucose reference range applies only to samples taken after fasting for at least 8 hours.  Glucose, capillary     Status: Abnormal   Collection Time: 08/07/21  9:11 PM  Result Value Ref Range   Glucose-Capillary 320 (H) 70 - 99 mg/dL    Comment: Glucose reference range applies only to samples taken after fasting for at least 8 hours.  Magnesium     Status: None   Collection Time: 08/08/21  2:56 AM  Result Value Ref Range   Magnesium 2.1 1.7 - 2.4 mg/dL    Comment: Performed at [facility], [place] [address] [place],   [address]  D-dimer, quantitative  Status: Abnormal   Collection Time: 08/08/21  2:56 AM  Result Value Ref Range   D-Dimer, Quant 1.84 (H) 0.00 - 0.50 ug/mL-FEU    Comment: (NOTE) At the manufacturer cut-off value of 0.5 g/mL FEU, this assay has a negative predictive value of 95-100%.This assay is intended for use in conjunction with a clinical pretest probability (PTP) assessment model to exclude pulmonary embolism (PE) and deep venous thrombosis (DVT) in outpatients suspected of PE or DVT. Results should be correlated with clinical presentation. Performed at [facility], [place] [address], [place], [place] [address]   C-reactive protein     Status: Abnormal   Collection Time: 08/08/21  2:56 AM  Result Value Ref Range   CRP 3.9 (H) <1.0 mg/dL    Comment: Performed at [place] [address], [place], [place] [address]  Comprehensive metabolic panel     Status: Abnormal   Collection Time: 08/08/21  2:56 AM  Result Value Ref Range   Sodium 137 135 - 145 mmol/L   Potassium 3.9 3.5 - 5.1 mmol/L   Chloride 100 98 - 111 mmol/L   CO2 27 22 - 32 mmol/L   Glucose, Bld 255 (H) 70 - 99 mg/dL    Comment: Glucose reference range applies only to samples taken after fasting for at least 8 hours.   BUN 26 (H) 8 - 23 mg/dL   Creatinine, Ser 0.74 0.44 - 1.00 mg/dL   Calcium 9.0 8.9 - 10.3 mg/dL   Total Protein 6.6 6.5 - 8.1 g/dL   Albumin 2.7 (L) 3.5 - 5.0 g/dL   AST 17 15 - 41 U/L   ALT 9 0 - 44 U/L   Alkaline Phosphatase 112 38 - 126 U/L   Total Bilirubin 0.5 0.3 - 1.2 mg/dL   GFR, Estimated >60 >60 mL/min    Comment: (NOTE) Calculated using the CKD-EPI Creatinine Equation (2021)    Anion gap 10 5 - 15    Comment: Performed at [facility], [place] [address] [place], [place] [address]  CBC with Differential/Platelet     Status: Abnormal   Collection Time: 08/08/21  2:56 AM  Result Value Ref Range   WBC 11.1 (H) 4.0 - 10.5 K/uL   RBC 3.35  (L) 3.87 - 5.11 MIL/uL   Hemoglobin 10.7 (L) 12.0 - 15.0 g/dL   HCT 32.1 (L) 36.0 - 46.0 %   MCV 95.8 80.0 - 100.0 fL   MCH 31.9 26.0 - 34.0 pg   MCHC 33.3 30.0 - 36.0 g/dL   RDW 15.2 11.5 - 15.5 %   Platelets 282 150 - 400 K/uL   nRBC 0.2 0.0 - 0.2 %   Neutrophils Relative % 93 %   Neutro Abs 10.4 (H) 1.7 - 7.7 K/uL   Lymphocytes Relative 2 %   Lymphs Abs 0.2 (L) 0.7 - 4.0 K/uL   Monocytes Relative 4 %   Monocytes Absolute 0.4 0.1 - 1.0 K/uL   Eosinophils Relative 0 %   Eosinophils Absolute 0.0 0.0 - 0.5 K/uL   Basophils Relative 0 %   Basophils Absolute 0.0 0.0 - 0.1 K/uL   Immature Granulocytes 1 %   Abs Immature Granulocytes 0.11 (H) 0.00 - 0.07 K/uL    Comment: Performed at [facility], [place] [address], [place], [place] [address]  Glucose, capillary     Status: Abnormal   Collection Time: 08/08/21  8:42 AM  Result Value Ref Range   Glucose-Capillary 329 (  H) 70 - 99 mg/dL    Comment: Glucose reference range applies only to samples taken after fasting for at least 8 hours.  Glucose, capillary     Status: Abnormal   Collection Time: 08/08/21 12:39 PM  Result Value Ref Range   Glucose-Capillary 368 (H) 70 - 99 mg/dL    Comment: Glucose reference range applies only to samples taken after fasting for at least 8 hours.  Glucose, capillary     Status: Abnormal   Collection Time: 08/08/21  4:49 PM  Result Value Ref Range   Glucose-Capillary 301 (H) 70 - 99 mg/dL    Comment: Glucose reference range applies only to samples taken after fasting for at least 8 hours.   Comment 1 Notify RN    Comment 2 Document in Chart   Glucose, capillary     Status: Abnormal   Collection Time: 08/08/21  9:02 PM  Result Value Ref Range   Glucose-Capillary 207 (H) 70 - 99 mg/dL    Comment: Glucose reference range applies only to samples taken after fasting for at least 8 hours.  Magnesium     Status: None   Collection Time: 08/09/21  2:53 AM  Result Value Ref Range   Magnesium  2.2 1.7 - 2.4 mg/dL    Comment: Performed at [facility], [place] [address], [place], [place] [address]  D-dimer, quantitative     Status: Abnormal   Collection Time: 08/09/21  2:53 AM  Result Value Ref Range   D-Dimer, Quant 2.19 (H) 0.00 - 0.50 ug/mL-FEU    Comment: (NOTE) At the manufacturer cut-off value of 0.5 g/mL FEU, this assay has a negative predictive value of 95-100%.This assay is intended for use in conjunction with a clinical pretest probability (PTP) assessment model to exclude pulmonary embolism (PE) and deep venous thrombosis (DVT) in outpatients suspected of PE or DVT. Results should be correlated with clinical presentation. Performed at [facility], [place] [address] [place], [place] [address]   C-reactive protein     Status: Abnormal   Collection Time: 08/09/21  2:53 AM  Result Value Ref Range   CRP 3.3 (H) <1.0 mg/dL    Comment: Performed at [place] [address] [place], [place] [address]  Comprehensive metabolic panel     Status: Abnormal   Collection Time: 08/09/21  2:53 AM  Result Value Ref Range   Sodium 137 135 - 145 mmol/L   Potassium 4.1 3.5 - 5.1 mmol/L   Chloride 102 98 - 111 mmol/L   CO2 27 22 - 32 mmol/L   Glucose, Bld 204 (H) 70 - 99 mg/dL    Comment: Glucose reference range applies only to samples taken after fasting for at least 8 hours.   BUN 34 (H) 8 - 23 mg/dL   Creatinine, Ser 0.78 0.44 - 1.00 mg/dL   Calcium 8.7 (L) 8.9 - 10.3 mg/dL   Total Protein 6.4 (L) 6.5 - 8.1 g/dL   Albumin 2.6 (L) 3.5 - 5.0 g/dL   AST 15 15 - 41 U/L   ALT 10 0 - 44 U/L   Alkaline Phosphatase 117 38 - 126 U/L   Total Bilirubin 0.6 0.3 - 1.2 mg/dL   GFR, Estimated >60 >60 mL/min    Comment: (NOTE) Calculated using the CKD-EPI Creatinine Equation (2021)    Anion gap 8 5 - 15    Comment: Performed at [facility], [place] [address], [place], [place] [address]  CBC  with Differential/Platelet      Status: Abnormal   Collection Time: 08/09/21  2:53 AM  Result Value Ref Range   WBC 10.1 4.0 - 10.5 K/uL   RBC 3.54 (L) 3.87 - 5.11 MIL/uL   Hemoglobin 11.2 (L) 12.0 - 15.0 g/dL   HCT 34.7 (L) 36.0 - 46.0 %   MCV 98.0 80.0 - 100.0 fL   MCH 31.6 26.0 - 34.0 pg   MCHC 32.3 30.0 - 36.0 g/dL   RDW 15.3 11.5 - 15.5 %   Platelets 275 150 - 400 K/uL   nRBC 0.2 0.0 - 0.2 %   Neutrophils Relative % 93 %   Neutro Abs 9.3 (H) 1.7 - 7.7 K/uL   Lymphocytes Relative 2 %   Lymphs Abs 0.2 (L) 0.7 - 4.0 K/uL   Monocytes Relative 4 %   Monocytes Absolute 0.4 0.1 - 1.0 K/uL   Eosinophils Relative 0 %   Eosinophils Absolute 0.0 0.0 - 0.5 K/uL   Basophils Relative 0 %   Basophils Absolute 0.0 0.0 - 0.1 K/uL   Immature Granulocytes 1 %   Abs Immature Granulocytes 0.13 (H) 0.00 - 0.07 K/uL    Comment: Performed at [facility], [place] [address], [place], [place] [address]  Brain natriuretic peptide     Status: Abnormal   Collection Time: 08/09/21  7:00 AM  Result Value Ref Range   B Natriuretic Peptide 551.0 (H) 0.0 - 100.0 pg/mL    Comment: Performed at [facility], [place] [address] [place], [place] [address]  Glucose, capillary     Status: Abnormal   Collection Time: 08/09/21  8:12 AM  Result Value Ref Range   Glucose-Capillary 206 (H) 70 - 99 mg/dL    Comment: Glucose reference range applies only to samples taken after fasting for at least 8 hours.  Troponin I (High Sensitivity)     Status: None   Collection Time: 08/09/21  9:21 AM  Result Value Ref Range   Troponin I (High Sensitivity) 16 <18 ng/L    Comment: (NOTE) Elevated high sensitivity troponin I (hsTnI) values and significant  changes across serial measurements may suggest ACS but many other  chronic and acute conditions are known to elevate hsTnI results.  Refer to the "Links" section for chest pain algorithms and additional  guidance. Performed at [facility], [place] [address] [place], [place] [address]   Troponin I (High Sensitivity)     Status: Abnormal   Collection Time: 08/09/21 11:17 AM  Result Value Ref Range   Troponin I (High Sensitivity) 150 (HH) <18 ng/L    Comment: CRITICAL RESULT CALLED TO, READ BACK BY AND VERIFIED WITH: COCHRAN, H. RN ON 08/09/2021 @ 1225 BY MECIAL J. (NOTE) Elevated high sensitivity troponin I (hsTnI) values and significant  changes across serial measurements may suggest ACS but many other  chronic and acute conditions are known to elevate hsTnI results.  Refer to the Links section for chest pain algorithms and additional  guidance. Performed at [facility], [place] [address], [place],  [address]   Glucose, capillary     Status: Abnormal   Collection Time: 08/09/21 11:23 AM  Result Value Ref Range   Glucose-Capillary 233 (H) 70 - 99 mg/dL    Comment: Glucose reference range applies only to samples taken after fasting for at least 8 hours.   Comment 1 Notify RN    Comment 2 Document in Chart   Troponin I (  High Sensitivity)     Status: Abnormal   Collection Time: 08/09/21 12:51 PM  Result Value Ref Range   Troponin I (High Sensitivity) 928 (HH) <18 ng/L    Comment: CRITICAL RESULT CALLED TO, READ BACK BY AND VERIFIED WITH: COCHRAN, H. RN ON 08/09/2021 1415 MECIAL J. (NOTE) Elevated high sensitivity troponin I (hsTnI) values and significant  changes across serial measurements may suggest ACS but many other  chronic and acute conditions are known to elevate hsTnI results.  Refer to the Links section for chest pain algorithms and additional  guidance. Performed at [facility], [place] [address], [place], [place] [address]   ECHOCARDIOGRAM COMPLETE     Status: None   Collection Time: 08/09/21  1:05 PM  Result Value Ref Range   Weight 3,283.97 oz   Height 61 in   BP 161/81 mmHg   S' Lateral 3.30 cm  Glucose, capillary     Status: Abnormal   Collection Time: 08/09/21  3:40 PM   Result Value Ref Range   Glucose-Capillary 181 (H) 70 - 99 mg/dL    Comment: Glucose reference range applies only to samples taken after fasting for at least 8 hours.   Comment 1 Notify RN    Comment 2 Document in Chart   Troponin I (High Sensitivity)     Status: Abnormal   Collection Time: 08/09/21  3:41 PM  Result Value Ref Range   Troponin I (High Sensitivity) 2,835 (HH) <18 ng/L    Comment: CRITICAL RESULT CALLED TO, READ BACK BY AND VERIFIED WITH: CHOCHRAN,H. RN ON 08/09/2021 @ 1742 BY MECIAL J. (NOTE) Elevated high sensitivity troponin I (hsTnI) values and significant  changes across serial measurements may suggest ACS but many other  chronic and acute conditions are known to elevate hsTnI results.  Refer to the Links section for chest pain algorithms and additional  guidance. Performed at [facility], [place] [address], [place], [place] [address]   Glucose, capillary     Status: Abnormal   Collection Time: 08/09/21  4:59 PM  Result Value Ref Range   Glucose-Capillary 149 (H) 70 - 99 mg/dL    Comment: Glucose reference range applies only to samples taken after fasting for at least 8 hours.   Comment 1 Notify RN    Comment 2 Document in Chart   APTT     Status: None   Collection Time: 08/09/21  8:50 PM  Result Value Ref Range   aPTT 25 24 - 36 seconds    Comment: Performed at [facility], [place] [address] [place], [place] [address]  Heparin level (unfractionated)     Status: Abnormal   Collection Time: 08/09/21  8:50 PM  Result Value Ref Range   Heparin Unfractionated >1.10 (H) 0.30 - 0.70 IU/mL    Comment: (NOTE) The clinical reportable range upper limit is being lowered to >1.10 to align with the FDA approved guidance for the current laboratory assay.  If heparin results are below expected values, and patient dosage has  been confirmed, suggest follow up testing of antithrombin III levels. Performed at [facility], [place] [address], [place], [place] [address]   CBC with Differential/Platelet     Status: Abnormal   Collection Time: 08/10/21  5:15 AM  Result Value Ref Range   WBC 9.2 4.0 - 10.5 K/uL   RBC 3.35 (L) 3.87 - 5.11 MIL/uL   Hemoglobin 10.6 (L) 12.0 - 15.0 g/dL   HCT  32.0 (L) 36.0 - 46.0 %   MCV 95.5 80.0 - 100.0 fL   MCH 31.6 26.0 - 34.0 pg   MCHC 33.1 30.0 - 36.0 g/dL   RDW 15.3 11.5 - 15.5 %   Platelets 315 150 - 400 K/uL   nRBC 0.2 0.0 - 0.2 %   Neutrophils Relative % 91 %   Neutro Abs 8.4 (H) 1.7 - 7.7 K/uL   Lymphocytes Relative 3 %   Lymphs Abs 0.3 (L) 0.7 - 4.0 K/uL   Monocytes Relative 5 %   Monocytes Absolute 0.4 0.1 - 1.0 K/uL   Eosinophils Relative 0 %   Eosinophils Absolute 0.0 0.0 - 0.5 K/uL   Basophils Relative 0 %   Basophils Absolute 0.0 0.0 - 0.1 K/uL   Immature Granulocytes 1 %   Abs Immature Granulocytes 0.13 (H) 0.00 - 0.07 K/uL    Comment: Performed at [facility], [place] [address] [place], [place] [address]  Comprehensive metabolic panel     Status: Abnormal   Collection Time: 08/10/21  5:15 AM  Result Value Ref Range   Sodium 138 135 - 145 mmol/L   Potassium 3.4 (L) 3.5 - 5.1 mmol/L    Comment: DELTA CHECK NOTED   Chloride 99 98 - 111 mmol/L   CO2 31 22 - 32 mmol/L   Glucose, Bld 119 (H) 70 - 99 mg/dL    Comment: Glucose reference range applies only to samples taken after fasting for at least 8 hours.   BUN 38 (H) 8 - 23 mg/dL   Creatinine, Ser 0.77 0.44 - 1.00 mg/dL   Calcium 8.4 (L) 8.9 - 10.3 mg/dL   Total Protein 5.9 (L) 6.5 - 8.1 g/dL   Albumin 2.3 (L) 3.5 - 5.0 g/dL   AST 27 15 - 41 U/L   ALT 11 0 - 44 U/L   Alkaline Phosphatase 99 38 - 126 U/L   Total Bilirubin 0.5 0.3 - 1.2 mg/dL   GFR, Estimated >60 >60 mL/min    Comment: (NOTE) Calculated using the CKD-EPI Creatinine Equation (2021)    Anion gap 8 5 - 15    Comment: Performed at [facility], [place] [address] [place], [place] [address]   Magnesium     Status: None   Collection Time: 08/10/21  5:15 AM  Result Value Ref Range   Magnesium 2.1 1.7 - 2.4 mg/dL    Comment: Performed at [facility], [place] [address], [place], [place] [address]  Phosphorus     Status: None   Collection Time: 08/10/21  5:15 AM  Result Value Ref Range   Phosphorus 3.7 2.5 - 4.6 mg/dL    Comment: Performed at [facility], [place] [address] [place], [place] [address]  Brain natriuretic peptide     Status: Abnormal   Collection Time: 08/10/21  5:15 AM  Result Value Ref Range   B Natriuretic Peptide 320.3 (H) 0.0 - 100.0 pg/mL    Comment: Performed at [facility], [place] [address] [place], [place] [address]  D-dimer, quantitative     Status: Abnormal   Collection Time: 08/10/21  5:15 AM  Result Value Ref Range   D-Dimer, Quant 2.01 (H) 0.00 - 0.50 ug/mL-FEU    Comment: (NOTE) At the manufacturer cut-off value of 0.5 g/mL FEU, this assay has a negative predictive value of 95-100%.This assay is intended for use in conjunction with a clinical pretest probability (PTP) assessment model to exclude pulmonary  embolism (PE) and deep venous thrombosis (DVT) in outpatients suspected of PE or DVT. Results should be correlated with clinical presentation. Performed at [facility], [place] [address], [place], [place] [address]   APTT     Status: Abnormal   Collection Time: 08/10/21  5:15 AM  Result Value Ref Range   aPTT 74 (H) 24 - 36 seconds    Comment:        IF BASELINE aPTT IS ELEVATED, SUGGEST PATIENT RISK ASSESSMENT BE USED TO DETERMINE APPROPRIATE ANTICOAGULANT THERAPY. Performed at [facility], [place] [address], [place], [place] [address]   Troponin I (High Sensitivity)     Status: Abnormal   Collection Time: 08/10/21  5:15 AM  Result Value Ref Range   Troponin I (High Sensitivity) 4,125 (HH) <18 ng/L    Comment: DELTA CHECK NOTED CRITICAL RESULT  CALLED TO, READ BACK BY AND VERIFIED WITH: HARRISON,D. RN AT 2979 08/10/21 MULLINS,T (NOTE) Elevated high sensitivity troponin I (hsTnI) values and significant  changes across serial measurements may suggest ACS but many other  chronic and acute conditions are known to elevate hsTnI results.  Refer to the Links section for chest pain algorithms and additional  guidance. Performed at [facility], [place] [address] [place], [place] [address]   Glucose, capillary     Status: Abnormal   Collection Time: 08/10/21  7:59 AM  Result Value Ref Range   Glucose-Capillary 115 (H) 70 - 99 mg/dL    Comment: Glucose reference range applies only to samples taken after fasting for at least 8 hours.  Glucose, capillary     Status: Abnormal   Collection Time: 08/10/21 11:37 AM  Result Value Ref Range   Glucose-Capillary 137 (H) 70 - 99 mg/dL    Comment: Glucose reference range applies only to samples taken after fasting for at least 8 hours.  APTT     Status: Abnormal   Collection Time: 08/10/21  2:00 PM  Result Value Ref Range   aPTT 92 (H) 24 - 36 seconds    Comment:        IF BASELINE aPTT IS ELEVATED, SUGGEST PATIENT RISK ASSESSMENT BE USED TO DETERMINE APPROPRIATE ANTICOAGULANT THERAPY. Performed at [facility], [place] [address] [place], [place] [address]   Glucose, capillary     Status: Abnormal   Collection Time: 08/10/21  5:56 PM  Result Value Ref Range   Glucose-Capillary 242 (H) 70 - 99 mg/dL    Comment: Glucose reference range applies only to samples taken after fasting for at least 8 hours.  Glucose, capillary     Status: Abnormal   Collection Time: 08/10/21  9:59 PM  Result Value Ref Range   Glucose-Capillary 238 (H) 70 - 99 mg/dL    Comment: Glucose reference range applies only to samples taken after fasting for at least 8 hours.  Heparin level (unfractionated)     Status: Abnormal   Collection Time: 08/11/21  3:09 AM  Result Value Ref Range    Heparin Unfractionated >1.10 (H) 0.30 - 0.70 IU/mL    Comment: (NOTE) The clinical reportable range upper limit is being lowered to >1.10 to align with the FDA approved guidance for the current laboratory assay.  If heparin results are below expected values, and patient dosage has  been confirmed, suggest follow up testing of antithrombin III levels. Performed at [facility], [place] [address], [place], [place] [address]   CBC with Differential/Platelet  Status: Abnormal   Collection Time: 08/11/21  3:09 AM  Result Value Ref Range   WBC 8.4 4.0 - 10.5 K/uL   RBC 3.64 (L) 3.87 - 5.11 MIL/uL   Hemoglobin 11.6 (L) 12.0 - 15.0 g/dL   HCT 35.0 (L) 36.0 - 46.0 %   MCV 96.2 80.0 - 100.0 fL   MCH 31.9 26.0 - 34.0 pg   MCHC 33.1 30.0 - 36.0 g/dL   RDW 15.5 11.5 - 15.5 %   Platelets 332 150 - 400 K/uL   nRBC 0.2 0.0 - 0.2 %   Neutrophils Relative % 94 %   Neutro Abs 7.9 (H) 1.7 - 7.7 K/uL   Lymphocytes Relative 2 %   Lymphs Abs 0.1 (L) 0.7 - 4.0 K/uL   Monocytes Relative 3 %   Monocytes Absolute 0.2 0.1 - 1.0 K/uL   Eosinophils Relative 0 %   Eosinophils Absolute 0.0 0.0 - 0.5 K/uL   Basophils Relative 0 %   Basophils Absolute 0.0 0.0 - 0.1 K/uL   Immature Granulocytes 1 %   Abs Immature Granulocytes 0.10 (H) 0.00 - 0.07 K/uL    Comment: Performed at [facility], [place] [address], [place], [place] [address]  Comprehensive metabolic panel     Status: Abnormal   Collection Time: 08/11/21  3:09 AM  Result Value Ref Range   Sodium 137 135 - 145 mmol/L   Potassium 4.3 3.5 - 5.1 mmol/L    Comment: DELTA CHECK NOTED   Chloride 98 98 - 111 mmol/L   CO2 30 22 - 32 mmol/L   Glucose, Bld 211 (H) 70 - 99 mg/dL    Comment: Glucose reference range applies only to samples taken after fasting for at least 8 hours.   BUN 41 (H) 8 - 23 mg/dL   Creatinine, Ser 0.87 0.44 - 1.00 mg/dL   Calcium 8.7 (L) 8.9 - 10.3 mg/dL   Total Protein 6.4 (L) 6.5 - 8.1 g/dL    Albumin 2.6 (L) 3.5 - 5.0 g/dL   AST 23 15 - 41 U/L   ALT 13 0 - 44 U/L   Alkaline Phosphatase 115 38 - 126 U/L   Total Bilirubin 0.6 0.3 - 1.2 mg/dL   GFR, Estimated >60 >60 mL/min    Comment: (NOTE) Calculated using the CKD-EPI Creatinine Equation (2021)    Anion gap 9 5 - 15    Comment: Performed at [facility], [place] [address] [place], [place] [address]  Magnesium     Status: None   Collection Time: 08/11/21  3:09 AM  Result Value Ref Range   Magnesium 2.2 1.7 - 2.4 mg/dL    Comment: Performed at [facility], [place] [address] [place], [place] [address]  Phosphorus     Status: None   Collection Time: 08/11/21  3:09 AM  Result Value Ref Range   Phosphorus 3.1 2.5 - 4.6 mg/dL    Comment: Performed at [facility], [place] [address], [place], [place] [address]  Brain natriuretic peptide     Status: Abnormal   Collection Time: 08/11/21  3:09 AM  Result Value Ref Range   B Natriuretic Peptide 202.5 (H) 0.0 - 100.0 pg/mL    Comment: Performed at [facility], [place] [address], [place], [place] [address]  C-reactive protein     Status: Abnormal   Collection Time: 08/11/21  3:09 AM  Result Value Ref Range   CRP 2.4 (H) <1.0 mg/dL  Comment: Performed at [facility], [place] [address] [place], [place] [address]  D-dimer, quantitative     Status: Abnormal   Collection Time: 08/11/21  3:09 AM  Result Value Ref Range   D-Dimer, Quant 1.43 (H) 0.00 - 0.50 ug/mL-FEU    Comment: (NOTE) At the manufacturer cut-off value of 0.5 g/mL FEU, this assay has a negative predictive value of 95-100%.This assay is intended for use in conjunction with a clinical pretest probability (PTP) assessment model to exclude pulmonary embolism (PE) and deep venous thrombosis (DVT) in outpatients suspected of PE or DVT. Results should be correlated with clinical presentation. Performed at [facility], [place]  [address] [place], [place] [address]   APTT     Status: Abnormal   Collection Time: 08/11/21  3:09 AM  Result Value Ref Range   aPTT 105 (H) 24 - 36 seconds    Comment:        IF BASELINE aPTT IS ELEVATED, SUGGEST PATIENT RISK ASSESSMENT BE USED TO DETERMINE APPROPRIATE ANTICOAGULANT THERAPY. Performed at [facility], [place] [address], [place], [place] [address]   Troponin I (High Sensitivity)     Status: Abnormal   Collection Time: 08/11/21  5:39 AM  Result Value Ref Range   Troponin I (High Sensitivity) 1,660 (HH) <18 ng/L    Comment: DELTA CHECK NOTED CRITICAL VALUE NOTED.  VALUE IS CONSISTENT WITH PREVIOUSLY REPORTED AND CALLED VALUE. (NOTE) Elevated high sensitivity troponin I (hsTnI) values and significant  changes across serial measurements may suggest ACS but many other  chronic and acute conditions are known to elevate hsTnI results.  Refer to the Links section for chest pain algorithms and additional  guidance. Performed at [facility], [place] [address] [place], [place] [address]   Glucose, capillary     Status: Abnormal   Collection Time: 08/11/21  8:42 AM  Result Value Ref Range   Glucose-Capillary 244 (H) 70 - 99 mg/dL    Comment: Glucose reference range applies only to samples taken after fasting for at least 8 hours.  Troponin I (High Sensitivity)     Status: Abnormal   Collection Time: 08/11/21 10:40 AM  Result Value Ref Range   Troponin I (High Sensitivity) 1,171 (HH) <18 ng/L    Comment: DELTA CHECK NOTED CRITICAL VALUE NOTED.  VALUE IS CONSISTENT WITH PREVIOUSLY REPORTED AND CALLED VALUE. (NOTE) Elevated high sensitivity troponin I (hsTnI) values and significant  changes across serial measurements may suggest ACS but many other  chronic and acute conditions are known to elevate hsTnI results.  Refer to the Links section for chest pain algorithms and additional  guidance. Performed at [facility],  [place] [address], [place], [place] [address]   Glucose, capillary     Status: Abnormal   Collection Time: 08/11/21  1:21 PM  Result Value Ref Range   Glucose-Capillary 288 (H) 70 - 99 mg/dL    Comment: Glucose reference range applies only to samples taken after fasting for at least 8 hours.   Comment 1 Notify RN    Comment 2 Document in Chart   Troponin I (High Sensitivity)     Status: Abnormal   Collection Time: 08/11/21  2:31 PM  Result Value Ref Range   Troponin I (High Sensitivity) 877 (HH) <18 ng/L    Comment: DELTA CHECK NOTED CRITICAL VALUE NOTED.  VALUE IS CONSISTENT WITH PREVIOUSLY REPORTED AND CALLED VALUE. (NOTE) Elevated high sensitivity troponin I (hsTnI) values and  significant  changes across serial measurements may suggest ACS but many other  chronic and acute conditions are known to elevate hsTnI results.  Refer to the Links section for chest pain algorithms and additional  guidance. Performed at [facility], [place] [address] [place], [place] [address]   APTT     Status: Abnormal   Collection Time: 08/11/21  2:31 PM  Result Value Ref Range   aPTT 115 (H) 24 - 36 seconds    Comment:        IF BASELINE aPTT IS ELEVATED, SUGGEST PATIENT RISK ASSESSMENT BE USED TO DETERMINE APPROPRIATE ANTICOAGULANT THERAPY. Performed at [facility], [place] [address], [place], [place] [address]   Glucose, capillary     Status: Abnormal   Collection Time: 08/11/21  5:43 PM  Result Value Ref Range   Glucose-Capillary 404 (H) 70 - 99 mg/dL    Comment: Glucose reference range applies only to samples taken after fasting for at least 8 hours.   Comment 1 Notify RN    Comment 2 Document in Chart   Hemoglobin A1c     Status: Abnormal   Collection Time: 08/11/21  7:10 PM  Result Value Ref Range   Hgb A1c MFr Bld 7.9 (H) 4.8 - 5.6 %    Comment: (NOTE) Pre diabetes:          5.7%-6.4%  Diabetes:              >6.4%  Glycemic control for    <7.0% adults with diabetes    Mean Plasma Glucose 180.03 mg/dL    Comment: Performed at [place] [address], [place], [place] [address]  Glucose, capillary     Status: Abnormal   Collection Time: 08/11/21  9:27 PM  Result Value Ref Range   Glucose-Capillary 229 (H) 70 - 99 mg/dL    Comment: Glucose reference range applies only to samples taken after fasting for at least 8 hours.  APTT     Status: Abnormal   Collection Time: 08/11/21 10:31 PM  Result Value Ref Range   aPTT 73 (H) 24 - 36 seconds    Comment:        IF BASELINE aPTT IS ELEVATED, SUGGEST PATIENT RISK ASSESSMENT BE USED TO DETERMINE APPROPRIATE ANTICOAGULANT THERAPY. Performed at [facility], [place] [address], [place], [place] [address]   Troponin I (High Sensitivity)     Status: Abnormal   Collection Time: 08/12/21  3:11 AM  Result Value Ref Range   Troponin I (High Sensitivity) 926 (HH) <18 ng/L    Comment: DELTA CHECK NOTED CRITICAL RESULT CALLED TO, READ BACK BY AND VERIFIED WITH: GRIFFIN,C. RN AT 3664 08/12/21 MULLINS,T (NOTE) Elevated high sensitivity troponin I (hsTnI) values and significant  changes across serial measurements may suggest ACS but many other  chronic and acute conditions are known to elevate hsTnI results.  Refer to the Links section for chest pain algorithms and additional  guidance. Performed at [facility], [place] [address] [place], [place] [address]   Heparin level (unfractionated)     Status: Abnormal   Collection Time: 08/12/21  3:12 AM  Result Value Ref Range   Heparin Unfractionated 0.77 (H) 0.30 - 0.70 IU/mL    Comment: (NOTE) The clinical reportable range upper limit is being lowered to >1.10 to align with the FDA approved guidance for the current laboratory assay.  If heparin results are below expected values, and patient dosage has  been confirmed, suggest follow up testing of antithrombin III levels. Performed at [facility], [place] [address], [place], [place] [address]   APTT     Status: Abnormal   Collection Time: 08/12/21  3:12 AM  Result Value Ref Range   aPTT 70 (H) 24 - 36 seconds    Comment:        IF BASELINE aPTT IS ELEVATED, SUGGEST PATIENT RISK ASSESSMENT BE USED TO DETERMINE APPROPRIATE ANTICOAGULANT THERAPY. Performed at [facility], [place] [address], [place], [place] [address]   CBC with Differential/Platelet     Status: Abnormal   Collection Time: 08/12/21  3:12 AM  Result Value Ref Range   WBC 8.0 4.0 - 10.5 K/uL   RBC 3.54 (L) 3.87 - 5.11 MIL/uL   Hemoglobin 11.1 (L) 12.0 - 15.0 g/dL   HCT 33.1 (L) 36.0 - 46.0 %   MCV 93.5 80.0 - 100.0 fL   MCH 31.4 26.0 - 34.0 pg   MCHC 33.5 30.0 - 36.0 g/dL   RDW 14.9 11.5 - 15.5 %   Platelets 318 150 - 400 K/uL   nRBC 0.0 0.0 - 0.2 %   Neutrophils Relative % 92 %   Neutro Abs 7.3 1.7 - 7.7 K/uL   Lymphocytes Relative 2 %   Lymphs Abs 0.2 (L) 0.7 - 4.0 K/uL   Monocytes Relative 5 %   Monocytes Absolute 0.4 0.1 - 1.0 K/uL   Eosinophils Relative 0 %   Eosinophils Absolute 0.0 0.0 - 0.5 K/uL   Basophils Relative 0 %   Basophils Absolute 0.0 0.0 - 0.1 K/uL   Immature Granulocytes 1 %   Abs Immature Granulocytes 0.07 0.00 - 0.07 K/uL    Comment: Performed at [facility], [place] [address], [place], [place] [address]  Comprehensive metabolic panel     Status: Abnormal   Collection Time: 08/12/21  3:12 AM  Result Value Ref Range   Sodium 133 (L) 135 - 145 mmol/L   Potassium 4.1 3.5 - 5.1 mmol/L   Chloride 96 (L) 98 - 111 mmol/L   CO2 28 22 - 32 mmol/L   Glucose, Bld 249 (H) 70 - 99 mg/dL    Comment: Glucose reference range applies only to samples taken after fasting for at least 8 hours.   BUN 44 (H) 8 - 23 mg/dL   Creatinine, Ser 0.93 0.44 - 1.00 mg/dL   Calcium 8.8 (L) 8.9 - 10.3 mg/dL   Total Protein 6.1 (L) 6.5 - 8.1 g/dL   Albumin 2.4 (L) 3.5 - 5.0 g/dL   AST 18 15 - 41 U/L    ALT 14 0 - 44 U/L   Alkaline Phosphatase 129 (H) 38 - 126 U/L   Total Bilirubin 0.8 0.3 - 1.2 mg/dL   GFR, Estimated >60 >60 mL/min    Comment: (NOTE) Calculated using the CKD-EPI Creatinine Equation (2021)    Anion gap 9 5 - 15    Comment: Performed at [facility], [place] [address], [place], [place] [address]  Magnesium     Status: None   Collection Time: 08/12/21  3:12 AM  Result Value Ref Range   Magnesium 1.9 1.7 - 2.4 mg/dL    Comment: Performed at [facility], [place] [address] [place], [place] [address]  Phosphorus     Status: None   Collection Time: 08/12/21  3:12 AM  Result Value Ref Range   Phosphorus 3.1 2.5 - 4.6 mg/dL  Comment: Performed at [facility], [place] [address], [place], [place] [address]  Brain natriuretic peptide     Status: Abnormal   Collection Time: 08/12/21  3:12 AM  Result Value Ref Range   B Natriuretic Peptide 194.7 (H) 0.0 - 100.0 pg/mL    Comment: Performed at [facility], [place] [address], [place], [place] [address]  Glucose, capillary     Status: Abnormal   Collection Time: 08/12/21  7:36 AM  Result Value Ref Range   Glucose-Capillary 282 (H) 70 - 99 mg/dL    Comment: Glucose reference range applies only to samples taken after fasting for at least 8 hours.  Glucose, capillary     Status: Abnormal   Collection Time: 08/12/21  1:25 PM  Result Value Ref Range   Glucose-Capillary 307 (H) 70 - 99 mg/dL    Comment: Glucose reference range applies only to samples taken after fasting for at least 8 hours.  Glucose, capillary     Status: Abnormal   Collection Time: 08/12/21  5:12 PM  Result Value Ref Range   Glucose-Capillary 305 (H) 70 - 99 mg/dL    Comment: Glucose reference range applies only to samples taken after fasting for at least 8 hours.  Glucose, capillary     Status: Abnormal   Collection Time: 08/12/21  9:29 PM  Result Value Ref Range   Glucose-Capillary 229 (H)  70 - 99 mg/dL    Comment: Glucose reference range applies only to samples taken after fasting for at least 8 hours.  Heparin level (unfractionated)     Status: None   Collection Time: 08/13/21  4:10 AM  Result Value Ref Range   Heparin Unfractionated 0.59 0.30 - 0.70 IU/mL    Comment: (NOTE) The clinical reportable range upper limit is being lowered to >1.10 to align with the FDA approved guidance for the current laboratory assay.  If heparin results are below expected values, and patient dosage has  been confirmed, suggest follow up testing of antithrombin III levels. Performed at [facility], [place] [address], [place], [place] [address]   APTT     Status: Abnormal   Collection Time: 08/13/21  4:10 AM  Result Value Ref Range   aPTT 46 (H) 24 - 36 seconds    Comment:        IF BASELINE aPTT IS ELEVATED, SUGGEST PATIENT RISK ASSESSMENT BE USED TO DETERMINE APPROPRIATE ANTICOAGULANT THERAPY. Performed at [facility], [place] [address] [place], [place] [address]   Comprehensive metabolic panel     Status: Abnormal   Collection Time: 08/13/21  4:10 AM  Result Value Ref Range   Sodium 131 (L) 135 - 145 mmol/L   Potassium 3.6 3.5 - 5.1 mmol/L   Chloride 92 (L) 98 - 111 mmol/L   CO2 29 22 - 32 mmol/L   Glucose, Bld 189 (H) 70 - 99 mg/dL    Comment: Glucose reference range applies only to samples taken after fasting for at least 8 hours.   BUN 47 (H) 8 - 23 mg/dL   Creatinine, Ser 0.89 0.44 - 1.00 mg/dL   Calcium 9.1 8.9 - 10.3 mg/dL   Total Protein 6.1 (L) 6.5 - 8.1 g/dL   Albumin 2.4 (L) 3.5 - 5.0 g/dL   AST 16 15 - 41 U/L   ALT 15 0 - 44 U/L   Alkaline Phosphatase 138 (H) 38 - 126 U/L   Total Bilirubin 0.7 0.3 - 1.2 mg/dL  GFR, Estimated >60 >60 mL/min    Comment: (NOTE) Calculated using the CKD-EPI Creatinine Equation (2021)    Anion gap 10 5 - 15    Comment: Performed at [facility], [place] [address],  [place], [place] [address]  Magnesium     Status: None   Collection Time: 08/13/21  4:10 AM  Result Value Ref Range   Magnesium 1.9 1.7 - 2.4 mg/dL    Comment: Performed at [facility], [place] [address], [place], [place] [address]  Phosphorus     Status: None   Collection Time: 08/13/21  4:10 AM  Result Value Ref Range   Phosphorus 3.5 2.5 - 4.6 mg/dL    Comment: Performed at [facility], [place] [address] [place], [place] [address]  C-reactive protein     Status: Abnormal   Collection Time: 08/13/21  4:10 AM  Result Value Ref Range   CRP 1.1 (H) <1.0 mg/dL    Comment: Performed at [place] [address] [place], [place] [address]  D-dimer, quantitative     Status: Abnormal   Collection Time: 08/13/21  4:10 AM  Result Value Ref Range   D-Dimer, Quant 0.93 (H) 0.00 - 0.50 ug/mL-FEU    Comment: (NOTE) At the manufacturer cut-off value of 0.5 g/mL FEU, this assay has a negative predictive value of 95-100%.This assay is intended for use in conjunction with a clinical pretest probability (PTP) assessment model to exclude pulmonary embolism (PE) and deep venous thrombosis (DVT) in outpatients suspected of PE or DVT. Results should be correlated with clinical presentation. Performed at [facility], [place] [address], [place],  [address]   Procalcitonin     Status: None   Collection Time: 08/13/21  4:10 AM  Result Value Ref Range   Procalcitonin <0.10 ng/mL    Comment:        Interpretation: PCT (Procalcitonin) <= 0.5 ng/mL: Systemic infection (sepsis) is not likely. Local bacterial infection is possible. (NOTE)       Sepsis PCT Algorithm           Lower Respiratory Tract                                      Infection PCT Algorithm    ----------------------------     ----------------------------         PCT < 0.25 ng/mL                PCT < 0.10 ng/mL          Strongly encourage             Strongly discourage    discontinuation of antibiotics    initiation of antibiotics    ----------------------------     -----------------------------       PCT 0.25 - 0.50 ng/mL            PCT 0.10 - 0.25 ng/mL               OR       >80% decrease in PCT            Discourage initiation of  antibiotics      Encourage discontinuation           of antibiotics    ----------------------------     -----------------------------         PCT >= 0.50 ng/mL              PCT 0.26 - 0.50 ng/mL               AND        <80% decrease in PCT             Encourage initiation of                                             antibiotics       Encourage continuation           of antibiotics    ----------------------------     -----------------------------        PCT >= 0.50 ng/mL                  PCT > 0.50 ng/mL               AND         increase in PCT                  Strongly encourage                                      initiation of antibiotics    Strongly encourage escalation           of antibiotics                                     -----------------------------                                           PCT <= 0.25 ng/mL                                                 OR                                        > 80% decrease in PCT                                      Discontinue / Do not initiate                                             antibiotics  Performed at [place] [address] [place], [place] [address]   Brain natriuretic peptide     Status: Abnormal   Collection  Time: 08/13/21  4:57 AM  Result Value Ref Range   B Natriuretic Peptide 331.3 (H) 0.0 - 100.0 pg/mL    Comment: Performed at [facility], [place] [address] [place], [place] [address]  Glucose, capillary     Status: Abnormal   Collection Time: 08/13/21  8:07 AM  Result Value Ref Range   Glucose-Capillary 200 (H) 70 - 99 mg/dL    Comment: Glucose  reference range applies only to samples taken after fasting for at least 8 hours.  Blood gas, arterial     Status: Abnormal   Collection Time: 08/13/21  8:34 AM  Result Value Ref Range   FIO2 40.00    Delivery systems HI FLOW NASAL CANNULA    pH, Arterial 7.471 (H) 7.350 - 7.450   pCO2 arterial 41.6 32.0 - 48.0 mmHg   pO2, Arterial 69.5 (L) 83.0 - 108.0 mmHg   Bicarbonate 30.0 (H) 20.0 - 28.0 mmol/L   Acid-Base Excess 6.1 (H) 0.0 - 2.0 mmol/L   O2 Saturation 93.6 %   Patient temperature 98.6    Collection site RIGHT BRACHIAL    Drawn by [id number]    Allens test (pass/fail) PASS PASS    Comment: Performed at [place] [address], [place], [place] [address]  Glucose, capillary     Status: Abnormal   Collection Time: 08/13/21 12:05 PM  Result Value Ref Range   Glucose-Capillary 327 (H) 70 - 99 mg/dL    Comment: Glucose reference range applies only to samples taken after fasting for at least 8 hours.   Comment 1 Notify RN    Comment 2 Document in Chart   Glucose, capillary     Status: Abnormal   Collection Time: 08/13/21  5:23 PM  Result Value Ref Range   Glucose-Capillary 223 (H) 70 - 99 mg/dL    Comment: Glucose reference range applies only to samples taken after fasting for at least 8 hours.   Comment 1 Notify RN    Comment 2 Document in Chart   Glucose, capillary     Status: Abnormal   Collection Time: 08/13/21  9:34 PM  Result Value Ref Range   Glucose-Capillary 193 (H) 70 - 99 mg/dL    Comment: Glucose reference range applies only to samples taken after fasting for at least 8 hours.  Heparin level (unfractionated)     Status: None   Collection Time: 08/14/21  4:09 AM  Result Value Ref Range   Heparin Unfractionated 0.46 0.30 - 0.70 IU/mL    Comment: (NOTE) The clinical reportable range upper limit is being lowered to >1.10 to align with the FDA approved guidance for the current laboratory assay.  If heparin results are below expected values, and  patient dosage has  been confirmed, suggest follow up testing of antithrombin III levels. Performed at [facility], [place] [address], [place], [place] [address]   CBC     Status: None   Collection Time: 08/14/21  4:10 AM  Result Value Ref Range   WBC 9.5 4.0 - 10.5 K/uL   RBC 3.89 3.87 - 5.11 MIL/uL   Hemoglobin 12.5 12.0 - 15.0 g/dL   HCT 36.8 36.0 - 46.0 %   MCV 94.6 80.0 - 100.0 fL   MCH 32.1 26.0 - 34.0 pg   MCHC 34.0 30.0 - 36.0 g/dL   RDW 15.0 11.5 - 15.5 %   Platelets 311 150 - 400 K/uL   nRBC 0.2 0.0 - 0.2 %  Comment: Performed at [facility] [place] [address], [place], [place] [address]  Procalcitonin     Status: None   Collection Time: 08/14/21  4:10 AM  Result Value Ref Range   Procalcitonin <0.10 ng/mL    Comment:        Interpretation: PCT (Procalcitonin) <= 0.5 ng/mL: Systemic infection (sepsis) is not likely. Local bacterial infection is possible. (NOTE)       Sepsis PCT Algorithm           Lower Respiratory Tract                                      Infection PCT Algorithm    ----------------------------     ----------------------------         PCT < 0.25 ng/mL                PCT < 0.10 ng/mL          Strongly encourage             Strongly discourage   discontinuation of antibiotics    initiation of antibiotics    ----------------------------     -----------------------------       PCT 0.25 - 0.50 ng/mL            PCT 0.10 - 0.25 ng/mL               OR       >80% decrease in PCT            Discourage initiation of                                            antibiotics      Encourage discontinuation           of antibiotics    ----------------------------     -----------------------------         PCT >= 0.50 ng/mL              PCT 0.26 - 0.50 ng/mL               AND        <80% decrease in PCT             Encourage initiation of                                             antibiotics       Encourage continuation            of antibiotics    ----------------------------     -----------------------------        PCT >= 0.50 ng/mL                  PCT > 0.50 ng/mL               AND         increase in PCT                  Strongly encourage  initiation of antibiotics    Strongly encourage escalation           of antibiotics                                     -----------------------------                                           PCT <= 0.25 ng/mL                                                 OR                                        > 80% decrease in PCT                                      Discontinue / Do not initiate                                             antibiotics  Performed at [place] [address], [place], [place] [address]   Glucose, capillary     Status: Abnormal   Collection Time: 08/14/21  8:16 AM  Result Value Ref Range   Glucose-Capillary 193 (H) 70 - 99 mg/dL    Comment: Glucose reference range applies only to samples taken after fasting for at least 8 hours.  Glucose, capillary     Status: Abnormal   Collection Time: 08/14/21 11:20 AM  Result Value Ref Range   Glucose-Capillary 311 (H) 70 - 99 mg/dL    Comment: Glucose reference range applies only to samples taken after fasting for at least 8 hours.  Glucose, capillary     Status: Abnormal   Collection Time: 08/14/21  4:28 PM  Result Value Ref Range   Glucose-Capillary 305 (H) 70 - 99 mg/dL    Comment: Glucose reference range applies only to samples taken after fasting for at least 8 hours.  Troponin I (High Sensitivity)     Status: Abnormal   Collection Time: 08/14/21  6:45 PM  Result Value Ref Range   Troponin I (High Sensitivity) 351 (HH) <18 ng/L    Comment: CRITICAL VALUE NOTED.  VALUE IS CONSISTENT WITH PREVIOUSLY REPORTED AND CALLED VALUE. (NOTE) Elevated high sensitivity troponin I (hsTnI) values and significant  changes across serial measurements  may suggest ACS but many other  chronic and acute conditions are known to elevate hsTnI results.  Refer to the Links section for chest pain algorithms and additional  guidance. Performed at [facility], [place] [address] [place], [place] [address]   Glucose, capillary     Status: None   Collection Time: 08/14/21  9:36 PM  Result Value Ref Range   Glucose-Capillary 71 70 - 99 mg/dL    Comment: Glucose reference range applies only to  samples taken after fasting for at least 8 hours.  Heparin level (unfractionated)     Status: None   Collection Time: 08/15/21  4:40 AM  Result Value Ref Range   Heparin Unfractionated 0.47 0.30 - 0.70 IU/mL    Comment: (NOTE) The clinical reportable range upper limit is being lowered to >1.10 to align with the FDA approved guidance for the current laboratory assay.  If heparin results are below expected values, and patient dosage has  been confirmed, suggest follow up testing of antithrombin III levels. Performed at [facility], [place] [address], [place], [place] [address]   CBC     Status: Abnormal   Collection Time: 08/15/21  4:40 AM  Result Value Ref Range   WBC 12.1 (H) 4.0 - 10.5 K/uL   RBC 4.21 3.87 - 5.11 MIL/uL   Hemoglobin 13.2 12.0 - 15.0 g/dL   HCT 39.9 36.0 - 46.0 %   MCV 94.8 80.0 - 100.0 fL   MCH 31.4 26.0 - 34.0 pg   MCHC 33.1 30.0 - 36.0 g/dL   RDW 15.1 11.5 - 15.5 %   Platelets 308 150 - 400 K/uL   nRBC 0.2 0.0 - 0.2 %    Comment: Performed at [facility], [place] [address], [place], [place] [address]  Comprehensive metabolic panel     Status: Abnormal   Collection Time: 08/15/21  4:40 AM  Result Value Ref Range   Sodium 135 135 - 145 mmol/L   Potassium 3.3 (L) 3.5 - 5.1 mmol/L   Chloride 94 (L) 98 - 111 mmol/L   CO2 31 22 - 32 mmol/L   Glucose, Bld 73 70 - 99 mg/dL    Comment: Glucose reference range applies only to samples taken after fasting for at least 8 hours.    BUN 58 (H) 8 - 23 mg/dL   Creatinine, Ser 0.92 0.44 - 1.00 mg/dL   Calcium 9.4 8.9 - 10.3 mg/dL   Total Protein 6.1 (L) 6.5 - 8.1 g/dL   Albumin 2.4 (L) 3.5 - 5.0 g/dL   AST 19 15 - 41 U/L   ALT 16 0 - 44 U/L   Alkaline Phosphatase 138 (H) 38 - 126 U/L   Total Bilirubin 0.7 0.3 - 1.2 mg/dL   GFR, Estimated >60 >60 mL/min    Comment: (NOTE) Calculated using the CKD-EPI Creatinine Equation (2021)    Anion gap 10 5 - 15    Comment: Performed at [facility], [place] [address] [place], [place] [address]  Magnesium     Status: None   Collection Time: 08/15/21  4:40 AM  Result Value Ref Range   Magnesium 2.2 1.7 - 2.4 mg/dL    Comment: Performed at [facility], [place] [address] [place], [place] [address]  Phosphorus     Status: None   Collection Time: 08/15/21  4:40 AM  Result Value Ref Range   Phosphorus 4.3 2.5 - 4.6 mg/dL    Comment: Performed at [facility], [place] [address], [place], [place] [address]  Glucose, capillary     Status: Abnormal   Collection Time: 08/15/21  8:08 AM  Result Value Ref Range   Glucose-Capillary 113 (H) 70 - 99 mg/dL    Comment: Glucose reference range applies only to samples taken after fasting for at least 8 hours.   Comment 1 Notify RN    Comment 2 Document in Chart   Glucose, capillary  Status: Abnormal   Collection Time: 08/15/21 11:40 AM  Result Value Ref Range   Glucose-Capillary 186 (H) 70 - 99 mg/dL    Comment: Glucose reference range applies only to samples taken after fasting for at least 8 hours.   Comment 1 Notify RN    Comment 2 Document in Chart   Glucose, capillary     Status: Abnormal   Collection Time: 08/15/21  6:19 PM  Result Value Ref Range   Glucose-Capillary 251 (H) 70 - 99 mg/dL    Comment: Glucose reference range applies only to samples taken after fasting for at least 8 hours.   Comment 1 Notify RN    Comment 2 Document in Chart   Glucose, capillary     Status:  Abnormal   Collection Time: 08/15/21 10:05 PM  Result Value Ref Range   Glucose-Capillary 360 (H) 70 - 99 mg/dL    Comment: Glucose reference range applies only to samples taken after fasting for at least 8 hours.  Heparin level (unfractionated)     Status: None   Collection Time: 08/16/21  4:30 AM  Result Value Ref Range   Heparin Unfractionated 0.54 0.30 - 0.70 IU/mL    Comment: (NOTE) The clinical reportable range upper limit is being lowered to >1.10 to align with the FDA approved guidance for the current laboratory assay.  If heparin results are below expected values, and patient dosage has  been confirmed, suggest follow up testing of antithrombin III levels. Performed at [facility], [place] [address] [place], [place] [address]   CBC     Status: Abnormal   Collection Time: 08/16/21  4:31 AM  Result Value Ref Range   WBC 12.8 (H) 4.0 - 10.5 K/uL   RBC 4.35 3.87 - 5.11 MIL/uL   Hemoglobin 13.4 12.0 - 15.0 g/dL   HCT 41.0 36.0 - 46.0 %   MCV 94.3 80.0 - 100.0 fL   MCH 30.8 26.0 - 34.0 pg   MCHC 32.7 30.0 - 36.0 g/dL   RDW 15.0 11.5 - 15.5 %   Platelets 315 150 - 400 K/uL   nRBC 0.2 0.0 - 0.2 %    Comment: Performed at [facility], [place] [address] [place], [place] [address]  Basic metabolic panel     Status: Abnormal   Collection Time: 08/16/21  6:00 AM  Result Value Ref Range   Sodium 132 (L) 135 - 145 mmol/L   Potassium 4.1 3.5 - 5.1 mmol/L    Comment: NO VISIBLE HEMOLYSIS DELTA CHECK NOTED    Chloride 94 (L) 98 - 111 mmol/L   CO2 29 22 - 32 mmol/L   Glucose, Bld 128 (H) 70 - 99 mg/dL    Comment: Glucose reference range applies only to samples taken after fasting for at least 8 hours.   BUN 62 (H) 8 - 23 mg/dL   Creatinine, Ser 0.82 0.44 - 1.00 mg/dL   Calcium 9.0 8.9 - 10.3 mg/dL   GFR, Estimated >60 >60 mL/min    Comment: (NOTE) Calculated using the CKD-EPI Creatinine Equation (2021)    Anion gap 9 5 - 15    Comment:  Performed at [facility], [place] [address], [place], [place] [address]  Glucose, capillary     Status: None   Collection Time: 08/16/21  7:57 AM  Result Value Ref Range   Glucose-Capillary 98 70 - 99 mg/dL    Comment: Glucose reference range applies only to samples taken after  fasting for at least 8 hours.  Glucose, capillary     Status: Abnormal   Collection Time: 08/16/21 11:34 AM  Result Value Ref Range   Glucose-Capillary 154 (H) 70 - 99 mg/dL    Comment: Glucose reference range applies only to samples taken after fasting for at least 8 hours.   Comment 1 Notify RN    Comment 2 Document in Chart   Glucose, capillary     Status: Abnormal   Collection Time: 08/16/21  4:48 PM  Result Value Ref Range   Glucose-Capillary 129 (H) 70 - 99 mg/dL    Comment: Glucose reference range applies only to samples taken after fasting for at least 8 hours.  Glucose, capillary     Status: Abnormal   Collection Time: 08/16/21  9:25 PM  Result Value Ref Range   Glucose-Capillary 164 (H) 70 - 99 mg/dL    Comment: Glucose reference range applies only to samples taken after fasting for at least 8 hours.   Comment 1 Notify RN    Comment 2 Document in Chart   Heparin level (unfractionated)     Status: None   Collection Time: 08/17/21  5:50 AM  Result Value Ref Range   Heparin Unfractionated 0.56 0.30 - 0.70 IU/mL    Comment: (NOTE) The clinical reportable range upper limit is being lowered to >1.10 to align with the FDA approved guidance for the current laboratory assay.  If heparin results are below expected values, and patient dosage has  been confirmed, suggest follow up testing of antithrombin III levels. Performed at [facility], [place] [address] [name], [place] [address]   CBC     Status: None   Collection Time: 08/17/21  5:50 AM  Result Value Ref Range   WBC 9.8 4.0 - 10.5 K/uL   RBC 4.01 3.87 - 5.11 MIL/uL   Hemoglobin 12.7 12.0 - 15.0 g/dL   HCT  38.4 36.0 - 46.0 %   MCV 95.8 80.0 - 100.0 fL   MCH 31.7 26.0 - 34.0 pg   MCHC 33.1 30.0 - 36.0 g/dL   RDW 15.4 11.5 - 15.5 %   Platelets 249 150 - 400 K/uL   nRBC 0.0 0.0 - 0.2 %    Comment: Performed at [facility], [place] [address], [place], [place] [address]  Basic metabolic panel     Status: Abnormal   Collection Time: 08/17/21  5:50 AM  Result Value Ref Range   Sodium 129 (L) 135 - 145 mmol/L   Potassium 3.4 (L) 3.5 - 5.1 mmol/L    Comment: DELTA CHECK NOTED   Chloride 95 (L) 98 - 111 mmol/L   CO2 27 22 - 32 mmol/L   Glucose, Bld 337 (H) 70 - 99 mg/dL    Comment: Glucose reference range applies only to samples taken after fasting for at least 8 hours.   BUN 56 (H) 8 - 23 mg/dL   Creatinine, Ser 1.03 (H) 0.44 - 1.00 mg/dL   Calcium 8.6 (L) 8.9 - 10.3 mg/dL   GFR, Estimated >60 >60 mL/min    Comment: (NOTE) Calculated using the CKD-EPI Creatinine Equation (2021)    Anion gap 7 5 - 15    Comment: Performed at [facility], [place] [address] [place], [place] [address]  Glucose, capillary     Status: Abnormal   Collection Time: 08/17/21  7:12 AM  Result Value Ref Range   Glucose-Capillary 336 (H) 70 - 99 mg/dL  Comment: Glucose reference range applies only to samples taken after fasting for at least 8 hours.   Comment 1 Notify RN    Comment 2 Document in Chart   Glucose, capillary     Status: Abnormal   Collection Time: 08/17/21 12:15 PM  Result Value Ref Range   Glucose-Capillary 220 (H) 70 - 99 mg/dL    Comment: Glucose reference range applies only to samples taken after fasting for at least 8 hours.   Comment 1 Notify RN    Comment 2 Document in Chart      Allergies:  Allergies  Allergen Reactions   Tetracycline Hives    Patient tolerated Doxycycline Dec 2020   Niacin Other (See Comments)    Mouth blisters   Niaspan [Niacin Er] Other (See Comments)    Mouth blisters   Sulfa Antibiotics Nausea Only and Other (See Comments)     "Tears up stomach"   Sulfonamide Derivatives Other (See Comments)    Reaction: per patient "tears her stomach up"   Codeine Nausea And Vomiting   Erythromycin Nausea And Vomiting   Hydromorphone Hcl Nausea And Vomiting   Morphine And Related Nausea And Vomiting   Nalbuphine Nausea And Vomiting    Nubain   Sulfasalazine Nausea Only and Other (See Comments)    per patient "tears her stomach up", "Tears up stomach"   Tape Rash and Other (See Comments)    No "plastic" tape," please----cloth tape only    Current Medications:  Prior to Admission medications   Medication Sig Start Date End Date Taking? Authorizing Provider  acetaminophen (TYLENOL) 500 MG tablet Take 1,000 mg by mouth daily.   Yes [provider]  albuterol (VENTOLIN HFA) 108 (90 Base) MCG/ACT inhaler TAKE 2 PUFFS BY MOUTH EVERY 6 HOURS AS NEEDED FOR WHEEZE OR SHORTNESS OF BREATH Patient taking differently: 2 puffs every 6 (six) hours as needed for wheezing or shortness of breath. 02/10/21  Yes Byrum, Rose Fillers, MD  apixaban (ELIQUIS) 5 MG TABS tablet Take 1 tablet (5 mg total) by mouth 2 (two) times daily. APPOINTMENT NEEDED WITH CARDIOLOGIST FOR FURTHER REFILLS Patient taking differently: Take 5 mg by mouth 2 (two) times daily. 04/17/21  Yes Sherran Needs, NP  azaTHIOprine (IMURAN) 50 MG tablet Take 125 mg by mouth See admin instructions. Take 2 1/2 tablets (125 mg) by mouth daily at 3pm   Yes [provider]  budesonide (PULMICORT) 0.5 MG/2ML nebulizer solution Take 2 mLs (0.5 mg total) by nebulization 2 (two) times daily. 08/24/19  Yes [name], MD  calcitRIOL (ROCALTROL) 0.25 MCG capsule Take 0.25 mcg by mouth every 3 (three) days.    Yes [provider]  cetirizine (ZYRTEC) 5 MG tablet Take 5 mg by mouth daily.   Yes [provider]  clopidogrel (PLAVIX) 75 MG tablet Take 1 tablet (75 mg total) by mouth daily. 02/22/21  Yes Reino Bellis B, NP  cyclobenzaprine (FLEXERIL) 10 MG  tablet TAKE 2 TAB DAILY AT BEDTIME, MAY ALSO TAKE 1 TABLET BY MOUTH AT NOON AS NEEDED FOR MUSCLE SPASMS 04/18/21  Yes Meredith Staggers, MD  fluticasone (FLONASE) 50 MCG/ACT nasal spray PLACE 2 SPRAYS INTO BOTH NOSTRILS 2 TIMES DAILY. Patient taking differently: 2 sprays 2 (two) times daily as needed for allergies. 06/27/21  Yes Collene Gobble, MD  glimepiride (AMARYL) 4 MG tablet Take 4 mg by mouth daily. 02/03/20  Yes [provider]  insulin aspart (NOVOLOG FLEXPEN) 100 UNIT/ML FlexPen Inject 4-10  Units into the skin 3 (three) times daily with meals. Per sliding scale :not provided   Yes [provider]  Insulin Glargine (BASAGLAR KWIKPEN) 100 UNIT/ML SOPN Inject 12 Units into the skin daily before breakfast.   Yes [provider]  isosorbide mononitrate (IMDUR) 60 MG 24 hr tablet Take 1 tablet (60 mg total) by mouth daily. 03/22/21  Yes Debbe Odea, MD  lamoTRIgine (LAMICTAL) 25 MG tablet TAKE 1 TABLET BY MOUTH EVERYDAY AT BEDTIME Patient taking differently: Take 25 mg by mouth at bedtime. 07/25/21  Yes Meredith Staggers, MD  LORazepam (ATIVAN) 1 MG tablet Take 1 tablet (1 mg total) by mouth 2 (two) times daily. 03/20/21  Yes [name], MD  metFORMIN (GLUCOPHAGE) 500 MG tablet Take 1 tablet (500 mg total) by mouth 2 (two) times daily. 02/22/21  Yes Burnell Blanks, MD  metoprolol tartrate (LOPRESSOR) 100 MG tablet Take 1 tablet (100 mg total) by mouth 2 (two) times daily. Appointment Required For Further Refills [phone] 03/22/21  Yes Rizwan, Eunice Blase, MD  montelukast (SINGULAIR) 10 MG tablet TAKE 1 TABLET BY MOUTH EVERYDAY AT BEDTIME Patient taking differently: Take 10 mg by mouth at bedtime. 06/23/21  Yes Collene Gobble, MD  nitroGLYCERIN (NITROSTAT) 0.4 MG SL tablet Place 1 tablet (0.4 mg total) under the tongue every 5 (five) minutes as needed for chest pain. 02/21/21  Yes Burnell Blanks, MD  Oxycodone HCl 10 MG TABS Take 1 tablet (10 mg total) by mouth  4 (four) times daily as needed. Patient taking differently: Take 10 mg by mouth 4 (four) times daily as needed (pain). 07/24/21  Yes Bayard Hugger, NP  OXYGEN Inhale 3 L into the lungs continuous.   Yes [provider]  pantoprazole (PROTONIX) 40 MG tablet TAKE 1 TABLET BY MOUTH EVERY DAY Patient taking differently: 40 mg daily. 07/31/21  Yes [name], MD  predniSONE (DELTASONE) 5 MG tablet Take 5 mg by mouth every morning. 06/19/19  Yes [provider]  promethazine (PHENERGAN) 25 MG tablet Take 25 mg by mouth every 6 (six) hours as needed for nausea or vomiting.   Yes [provider]  rosuvastatin (CRESTOR) 20 MG tablet TAKE 1 TABLET BY MOUTH EVERY DAY Patient taking differently: Take 20 mg by mouth daily. 06/16/21  Yes [name], MD  sertraline (ZOLOFT) 100 MG tablet Take 1 tablet (100 mg total) by mouth daily. 06/06/21  Yes [name], MD  arformoterol (BROVANA) 15 MCG/2ML NEBU Take 2 mLs (15 mcg total) by nebulization 2 (two) times daily. 08/30/19   Collene Gobble, MD  clotrimazole (MYCELEX) 10 MG troche Take 1 tablet (10 mg total) by mouth 5 (five) times daily as needed (thrush). Patient not taking: Reported on 06/19/2021 02/03/21   Edwin Dada, MD  magnesium oxide (MAG-OX) 400 (240 Mg) MG tablet Take 1 tablet (400 mg total) by mouth daily. Patient not taking: Reported on 05/07/2021 04/23/21   Flora Lipps, MD  Respiratory Therapy Supplies (NEBULIZER) DEVI Pt needs nebulizr machine dx J44.9 07/17/21   Collene Gobble, MD    Social History:    reports that she has been smoking cigarettes. She has a 30.00 pack-year smoking history. She has never used smokeless tobacco. She reports that she does not drink alcohol and does not use drugs.   Family History:    Family History  Problem Relation Age of Onset   Cancer Mother        liver  Heart disease Father    Cancer Father        colon   Arrhythmia Brother        Atrial  Fibrillation   Arrhythmia Paternal Aunt        Atrial Fibrillation    Psychiatric Specialty Exam: Physical Exam Pulmonary:     Comments: On nasal cannula Neurological:     Mental Status: She is alert and oriented to person, place, and time.    Review of Systems  Constitutional:        Tired  Respiratory:  Positive for shortness of breath.   Psychiatric/Behavioral:  Negative for hallucinations, self-injury and suicidal ideas. The patient is nervous/anxious. The patient is not hyperactive.    Blood pressure 120/72, pulse 83, temperature 97.7 F (36.5 C), temperature source Oral, resp. rate 16, height _0  (1.549 m), weight 87 kg, SpO2 96 %.Body mass index is 36.24 kg/m.  General Appearance: Casual  Eye Contact:  Fair  Speech:  Slow and soft  Volume:  Decreased  Mood:  Anxious and Dysphoric  Affect:  Constricted and Depressed  Thought Process:  Goal Directed  Orientation:  Full (Time, Place, and Person)  Thought Content:  Rumination  Suicidal Thoughts:  No  Homicidal Thoughts:  No  Memory:  Immediate;   Fair Recent;   Fair Remote;   Fair  Judgement:  Fair  Insight:  Present  Psychomotor Activity:  Decreased  Concentration:  Concentration: Fair and Attention Span: Fair  Recall:  [employer] of Knowledge:  Fair  Language:  Fair  Akathisia:  No  Handed:  Right  AIMS (if indicated):     Assets:  Communication Skills Desire for Improvement Housing  ADL's:    Cognition: fair, alert and oriented  Sleep:   fair         Assessment: Patient is 66 year old divorced female with multiple health issues with Chronic hypoxia, COVID PCR positive, extreme tiredness and fatigue . H/O depression and anxiety.  Patient symptoms intensify due to prolonged hospitalization but patient denies any suicidal plan, psychosis, hallucination.  Recommendation; Patient doing better on current dose of Zoloft and 50 mg and Ativan 0.5 mg twice a day.  Her breathing is improved and her anxiety is  also improved from the past.  She has chronic feeling of tiredness but no suicidal thoughts. Patient does not meet criteria for inpatient services.  Patient like to follow up with her pain doctor for psychotropic medication. Psychiatry C/L service will sign off however recommended to reconsult if symptoms get worse or new symptoms emerge.   Berniece Andreas MD Psychiatry

## 2021-08-18 DIAGNOSIS — J9621 Acute and chronic respiratory failure with hypoxia: Secondary | ICD-10-CM | POA: Diagnosis not present

## 2021-08-18 LAB — BASIC METABOLIC PANEL
Anion gap: 7 (ref 5–15)
BUN: 43 mg/dL — ABNORMAL HIGH (ref 8–23)
CO2: 30 mmol/L (ref 22–32)
Calcium: 9.3 mg/dL (ref 8.9–10.3)
Chloride: 94 mmol/L — ABNORMAL LOW (ref 98–111)
Creatinine, Ser: 0.82 mg/dL (ref 0.44–1.00)
GFR, Estimated: >60 mL/min (ref >60)
Glucose, Bld: 164 mg/dL — ABNORMAL HIGH (ref 70–99)
Potassium: 5 mmol/L (ref 3.5–5.1)
Sodium: 131 mmol/L — ABNORMAL LOW (ref 135–145)

## 2021-08-18 LAB — GLUCOSE, CAPILLARY
Glucose-Capillary: 161 mg/dL — ABNORMAL HIGH (ref 70–99)
Glucose-Capillary: 138 mg/dL — ABNORMAL HIGH (ref 70–99)
Glucose-Capillary: 263 mg/dL — ABNORMAL HIGH (ref 70–99)
Glucose-Capillary: 422 mg/dL — ABNORMAL HIGH (ref 70–99)

## 2021-08-18 LAB — CBC
HCT: 37.1 % (ref 36.0–46.0)
Hemoglobin: 12.1 g/dL (ref 12.0–15.0)
MCH: 31.3 pg (ref 26.0–34.0)
MCHC: 32.6 g/dL (ref 30.0–36.0)
MCV: 95.9 fL (ref 80.0–100.0)
Platelets: 224 10*3/uL (ref 150–400)
RBC: 3.87 MIL/uL (ref 3.87–5.11)
RDW: 15.2 % (ref 11.5–15.5)
WBC: 10.6 10*3/uL — ABNORMAL HIGH (ref 4.0–10.5)
nRBC: 0 % (ref 0.0–0.2)

## 2021-08-18 LAB — HEPARIN LEVEL (UNFRACTIONATED): Heparin Unfractionated: 0.6 IU/mL (ref 0.30–0.70)

## 2021-08-18 MED ORDER — ALTEPLASE 2 MG IJ SOLR: 2.0000 mg | Freq: Once | INTRAMUSCULAR | Status: DC

## 2021-08-18 MED ORDER — PREDNISONE 5 MG PO TABS: 5.0000 mg | ORAL_TABLET | Freq: Every day | ORAL | Status: DC

## 2021-08-18 NOTE — Progress Notes (Signed)
Physical Therapy Treatment Patient Details Name: Katherine [name] MRN: [id number] DOB: 1956/04/04 Today's Date: 08/18/2021   History of Present Illness Pt admitted from home 2* SOB with COVID PNA and with hx of CAD, CABG, PAD, PAF, COPD, Fibromyalgia, R eye CVA with residual blindness, kidney transplant and obesity    PT Comments    The patient appears to feel better. Patient motivated to get up today. Min assistnace with Hand hold to pivot to recliner. . Patient performing leg exercises in chair.  SPO2 on 15 L HFNC 955, dropped to 78 while mobilizing. Back to 945 with rest in recliner. BP 135/69 HR 76.  Continue PT.  Recommendations for follow up therapy are one component of a multi-disciplinary discharge planning process, led by the attending physician.  Recommendations may be updated based on patient status, additional functional criteria and insurance authorization.  Follow Up Recommendations  Home health PT     Assistance Recommended at Discharge Frequent or constant Supervision/Assistance  Equipment Recommendations  None recommended by PT    Recommendations for Other Services       Precautions / Restrictions Precautions Precautions: Fall Precaution Comments: monitor sats on 15 L HF     Mobility  Bed Mobility   Bed Mobility: Rolling;Sidelying to Sit Rolling: Min assist         General bed mobility comments: Increased time with cues for technique and assist to bring trunk to upright    Transfers Overall transfer level: Needs assistance Equipment used: 2 person hand held assist Transfers: Sit to/from Stand;Bed to chair/wheelchair/BSC Sit to Stand: Min assist;+2 safety/equipment;From elevated surface     Step pivot transfers: Min assist;+2 physical assistance;+2 safety/equipment     General transfer comment: Increased time with cues for LE management and use of UEs to self assist.  Step pvt bed to chair with 2 hand hold. Stood x  2 minutes before sitting  down.pt. reports plans home with brother assisting.    Ambulation/Gait                   Stairs             Wheelchair Mobility    Modified Rankin (Stroke Patients Only)       Balance   Sitting-balance support: No upper extremity supported Sitting balance-Leahy Scale: Fair     Standing balance support: Bilateral upper extremity supported;Reliant on assistive device for balance;During functional activity Standing balance-Leahy Scale: Poor                              Cognition Arousal/Alertness: Awake/alert Behavior During Therapy: WFL for tasks assessed/performed Overall Cognitive Status: Within Functional Limits for tasks assessed                                 General Comments: ready to mobilize today        Exercises      General Comments        Pertinent Vitals/Pain Pain Assessment: Faces Faces Pain Scale: Hurts even more Pain Location: all over Pain Descriptors / Indicators: Discomfort Pain Intervention(s): Monitored during session    Home Living                          Prior Function            PT Goals (current goals  can now be found in the care plan section) Progress towards PT goals: Progressing toward goals    Frequency    Min 3X/week      PT Plan Current plan remains appropriate    Co-evaluation              AM-PAC PT "6 Clicks" Mobility   Outcome Measure  Help needed turning from your back to your side while in a flat bed without using bedrails?: A Little Help needed moving from lying on your back to sitting on the side of a flat bed without using bedrails?: A Little Help needed moving to and from a bed to a chair (including a wheelchair)?: A Lot Help needed standing up from a chair using your arms (e.g., wheelchair or bedside chair)?: A Lot Help needed to walk in hospital room?: Total Help needed climbing 3-5 steps with a railing? : Total 6 Click Score: 12    End  of Session Equipment Utilized During Treatment: Gait belt;Oxygen Activity Tolerance: Patient tolerated treatment well Patient left: in chair;with call bell/phone within reach Nurse Communication: Mobility status PT Visit Diagnosis: Difficulty in walking, not elsewhere classified (R26.2)     Time: 1440-1500 PT Time Calculation (min) (ACUTE ONLY): 20 min  Charges:  $Therapeutic Activity: 8-22 mins                     [name] PT Acute Rehabilitation Services Pager [phone] Office [phone]    Claretha Cooper 08/18/2021, 3:54 PM

## 2021-08-18 NOTE — Progress Notes (Signed)
PROGRESS NOTE    Katherine [name]  YBO:[id number] DOB: Jan 09, 1956 DOA: 08/04/2021 PCP: [name], MD  Chief Complaint  Patient presents with   generalized weakness    Brief Narrative:  This is a 66 year old female with anxiety, depression, CAD/CABG, chronic hypoxic respiratory failure related to COPD on 3 L of oxygen at home, type 2 diabetes mellitus, end-stage renal disease status post renal transplant, GERD, obesity, hypertension paroxysmal atrial fibrillation, PAD who presented to the hospital on 12/19 with shortness of breath and generalized weakness.  She was found to have a pulse ox of 84% on her usual oxygen, noted to be wheezing and had a cough with yellow sputum.  COVID PCR noted to be positive.  She's been admitted for COVID 19 pneumonia and now found to have MRSA pneumonia.  She's had progressively worsening O2 needs, now on heated high flow.    See below for additional details   Seen by psychiatry on 08/16/2021 due to concern for polypharmacy.  Ativan dose reduced to 12.5 mg twice daily as needed for anxiety.  Continue Zoloft 100 mg daily, appreciate psychiatry's assistance.  08/18/2021: Seen and examined at bedside. No chest pain.  Breathing is improved.   Assessment & Plan:   Principal Problem:   Acute on chronic respiratory failure with hypoxia (HCC) Active Problems:   Hypomagnesemia   Dyslipidemia, goal LDL below 70   NSTEMI (non-ST elevated myocardial infarction) (HCC)   CAD S/P percutaneous coronary angioplasty - PCI x 5 to SVG-D1   Essential hypertension   PAD (peripheral artery disease) (HCC)   Paroxysmal atrial fibrillation (HCC); CHA2DS2VASc score F, HTN, CAD, CVA = 5   OSA (obstructive sleep apnea)   GAD (generalized anxiety disorder)   Acute CHF (congestive heart failure) ([place])   History of renal transplant   Uncontrolled type 2 diabetes mellitus with hyperglycemia, 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  Possible contribution from HF. Uses 2 LPM at home COVID test + on 12/18 CXR 12/24 with worsening severe diffuse bilateral airspace disease CT PE protocol chest 08/05/2021: limited for PE, but negative, extensive heterogenous and GGO throughout lungs  Satting in 90's, on 25 L HHFNC (slowly improving) Elevated BNP -> lasix as tolerated Completed remdesivir.  Continue IV steroids for covid and abx to cover for MRSA pneumonia She's immunosuppressed on antirejection meds with renal transplant (not candidate for baricitinib or actemra with MRSA infection and immunosuppression).  Poor prognosis with immunosuppression and covid.  COVID-19 Labs  Recent Labs    08/10/21 0515 08/11/21 0309  DDIMER 2.01* 1.43*  CRP  --  2.4*    COVID-19 Labs  Recent Labs    08/10/21 0515 08/11/21 0309  DDIMER 2.01* 1.43*  CRP  --  2.4*    Lab Results  Component Value Date   SARSCOV2NAA POSITIVE (A) 08/04/2021   SARSCOV2NAA NEGATIVE 05/12/2021   [place] NEGATIVE 04/19/2021   [place] NEGATIVE 03/30/2021     Positive D dimer On anticoagulation Follow LE [place] - negative  MRSA pneumonia ([place]) 12/20 sputum cx with MRSA (and candida albicans - suspect this is contaminant/colonizer)  On doxycycline, plan for  7 days (12/23 - present) As above  Chronic diastolic CHF (congestive heart failure) ([place])- (present on admission) Grade 1 diastolic dysfunction  Obesity (BMI 30-39.9)- (present on admission) Body mass index is 37.03 kg/m.  Placing the patient at high risk of poor outcome.  Monitor.  COVID-19 virus infection- (present on admission) As above Steroids, remdesivir (s/p 5  doses) Tapering steroids  Pressure injury of skin- (present on admission) Stage II, medial rectal ulcer. Present on admission. 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 Receives prednisone and Imuran. Placing the patient at high risk for poor outcome secondary to chronic immunosuppressive status  Acute CHF (congestive heart failure) (HCC) Elevated BNP Follow echo - EF 45-50%, RWMA (see report) Lasix as tolerated, follow renal function Strict I/O, daily weights - net negative  GAD (generalized anxiety disorder)- (present on admission) Continue Zoloft and Ativan.  OSA (obstructive sleep apnea)- (present on admission) Does not use CPAP at night after lap band.  Paroxysmal atrial fibrillation (HCC); CHA2DS2VASc score F, HTN, CAD, CVA = 5- (present on admission) Continue Metoprolol (dose reduced due to brady) and Eliquis -> now on heparin gtt  PAD (peripheral artery disease) ([place])- (present on admission) Prior history.  Monitor.  Essential hypertension Recently improved, follow Continue home BP meds, consider adding meds pending trend  CAD S/P percutaneous coronary angioplasty - PCI x 5 to SVG-D1 Prior history.  Monitor. W/u for chest pain above  NSTEMI (non-ST elevated myocardial infarction) ([place]) Episode of CP similar to prior heart attacks on 12/24 Continue nitro prn  Was already anticoagulated on eliquis, antiplatelet on plavix -> given concerning symptoms/rising troponin -> transitioned to heparin gtt, aspirin added to placix EF 45-50%, RWMA's (see report) Cardiology c/s, appreciate assistance -> plan for aspirin, plavix, imdur (increased dose), lopressor, crestor - consider arb/arni  if BP remains high, consider cath pending clinical status (note 12/25) Would want her O2 needs to improve prior to diagnostic cath - still has significant O2 needs Ok to work with PT/OT if asymptomatic  Dyslipidemia, goal LDL below 70- (present on admission) Continue statin.  Hypomagnesemia- (present on admission) Replaced.  Improving hyponatremia, serum sodium 129 from 135. Resolved Hypomagnesemia, serum mag 2.2. Worsening hyperglycemia, likely exacerbated by steroids.  Increase Semglee dose to 15 unit daily, NovoLog 8 units 3 times daily.  Hypercarbia is improving  Persistent Generalized weakness PT OT assessed and recommended home health PT OT Continue fall precautions Patient encouraged to participate with therapies Continue PT OT  MRSA pneumonia Personally reviewed last chest x-ray showing improvement of infiltrates. Completed course of IV doxycycline on 08/15/2021. Repeated procalcitonin level less than 0.10   Resolved hypokalemia K+ 3.3>> 4.1> 3.4>5.0  Chronic anxiety/depression with concern for polypharmacy Seen by psychiatry, appreciate recommendations.   DVT prophylaxis: Heparin gtt Code Status: Full Family Communication: None at bedside. Disposition:   Status is: Inpatient  Remains inpatient appropriate because: sig O2 requirement, NSTEMI       Consultants:  Cardiology Psychiatry  Procedures:  echo IMPRESSIONS     1. Left ventricular ejection fraction, by estimation, is 45 to 50%. The  left ventricle has mildly decreased function. The left ventricle  demonstrates regional wall motion abnormalities (see scoring  diagram/findings for description). There is mild left  ventricular hypertrophy. Left ventricular  diastolic function could not be  evaluated.   2. Right ventricular systolic function is normal. The right ventricular  size is normal.   3. The mitral valve is grossly normal. Trivial mitral valve  regurgitation.   4. The aortic valve has  an indeterminant number of cusps. There is  moderate calcification of the aortic valve. Aortic valve regurgitation is  not visualized. Aortic valve sclerosis/calcification is present, without  any evidence of aortic stenosis.   Comparison(s): Prior images reviewed side by side. Wall motion more  similar to last study with Definity contrast on 07/12/2020, although LVEF  somewhat better.   Summary:  BILATERAL:  -No evidence of popliteal cyst, bilaterally.      Antimicrobials:  Anti-infectives (From admission, onward)    Start     Dose/Rate Route Frequency Ordered Stop   08/08/21 1400  doxycycline (VIBRAMYCIN) 100 mg in sodium chloride 0.9 % 250 mL IVPB        100 mg 125 mL/hr over 120 Minutes Intravenous Every 12 hours 08/08/21 1327 08/15/21 1046   08/06/21 1000  remdesivir 100 mg in sodium chloride 0.9 % 100 mL IVPB  Status:  Discontinued       See Hyperspace for full Linked Orders Report.   100 mg 200 mL/hr over 30 Minutes Intravenous Daily 08/05/21 0720 08/05/21 0720   08/06/21 1000  remdesivir 100 mg in sodium chloride 0.9 % 100 mL IVPB        100 mg 200 mL/hr over 30 Minutes Intravenous Daily 08/05/21 0721 08/09/21 0937   08/05/21 1000  remdesivir 100 mg in sodium chloride 0.9 % 100 mL IVPB  Status:  Discontinued       See Hyperspace for full Linked Orders Report.   100 mg 200 mL/hr over 30 Minutes Intravenous Daily 08/04/21 0819 08/05/21 0720   08/05/21 1000  remdesivir 200 mg in sodium chloride 0.9% 250 mL IVPB        200 mg 580 mL/hr over 30 Minutes Intravenous Once 08/05/21 0719 08/05/21 1150   08/05/21 1000  cefTRIAXone (ROCEPHIN) 2 g in sodium chloride 0.9 % 100 mL IVPB        2 g 200 mL/hr over 30 Minutes Intravenous Every 24 hours 08/05/21 0815 08/09/21 1032   08/05/21 1000  azithromycin (ZITHROMAX) 500 mg in sodium chloride 0.9 % 250 mL IVPB  Status:  Discontinued        500 mg 250 mL/hr over 60 Minutes Intravenous Every 24 hours 08/05/21 0815 08/05/21 0907    08/04/21 0930  remdesivir 200 mg in sodium chloride 0.9% 250 mL IVPB  Status:  Discontinued       See Hyperspace for full Linked Orders Report.   200 mg 580 mL/hr over 30 Minutes Intravenous Once 08/04/21 0819 08/05/21 0720        Objective: Vitals:   08/18/21 1100 08/18/21 1200 08/18/21 1300 08/18/21 1400  BP:  (!) 121/55  (!) 103/53  Pulse: (!) 107 (!) 103 85 76  Resp: 18 20 19 14   Temp:  97.8 F (36.6 C)    TempSrc:  Oral    SpO2: 93% 93% 90% 95%  Weight:      Height:        Intake/Output Summary (Last 24 hours) at 08/18/2021 1432 Last data filed at 08/18/2021 1400 Gross per 24 hour  Intake 469.64 ml  Output 1500 ml  Net -1030.36 ml   Filed Weights   08/16/21 0407 08/17/21 0500 08/18/21 0500  Weight: 86.9 kg  87 kg 86.2 kg    Examination:  General: Frail no acute distress. A&O x3 Cardiovascular: RRR no rubs or gallops Lungs: CTA, no wheezes, no rales.  Abdomen: Soft non tender normal bowel sounds Neurological: Nonfocal exam Skin: No rashes no ulcerative lesions Extremities: No lower extremity edema  Data Reviewed: I have personally reviewed following labs and imaging studies  CBC: Recent Labs  Lab 08/12/21 0312 08/14/21 0410 08/15/21 0440 08/16/21 0431 08/17/21 0550 08/18/21 0508  WBC 8.0 9.5 12.1* 12.8* 9.8 10.6*  NEUTROABS 7.3  --   --   --   --   --   HGB 11.1* 12.5 13.2 13.4 12.7 12.1  HCT 33.1* 36.8 39.9 41.0 38.4 37.1  MCV 93.5 94.6 94.8 94.3 95.8 95.9  PLT 318 311 308 315 249 409    Basic Metabolic Panel: Recent Labs  Lab 08/12/21 0312 08/13/21 0410 08/15/21 0440 08/16/21 0600 08/17/21 0550 08/18/21 0508  NA 133* 131* 135 132* 129* 131*  K 4.1 3.6 3.3* 4.1 3.4* 5.0  CL 96* 92* 94* 94* 95* 94*  CO2 28 29 31 29 27 30   GLUCOSE 249* 189* 73 128* 337* 164*  BUN 44* 47* 58* 62* 56* 43*  CREATININE 0.93 0.89 0.92 0.82 1.03* 0.82  CALCIUM 8.8* 9.1 9.4 9.0 8.6* 9.3  MG 1.9 1.9 2.2  --   --   --   PHOS 3.1 3.5 4.3  --   --   --      GFR: Estimated Creatinine Clearance: 69.2 mL/min (by C-G formula based on SCr of 0.82 mg/dL).  Liver Function Tests: Recent Labs  Lab 08/12/21 0312 08/13/21 0410 08/15/21 0440  AST 18 16 19   ALT 14 15 16   ALKPHOS 129* 138* 138*  BILITOT 0.8 0.7 0.7  PROT 6.1* 6.1* 6.1*  ALBUMIN 2.4* 2.4* 2.4*    CBG: Recent Labs  Lab 08/17/21 1215 08/17/21 1604 08/17/21 2123 08/18/21 0752 08/18/21 1112  GLUCAP 220* 215* 329* 161* 138*     No results found for this or any previous visit (from the past 240 hour(s)).        Radiology Studies: No results found.      Scheduled Meds:  (feeding supplement) PROSource Plus  30 mL Oral BID BM   alteplase  2 mg Intracatheter Once   arformoterol  15 mcg Nebulization BID   vitamin C  500 mg Oral Daily   aspirin  81 mg Oral Daily   budesonide  2 mL Nebulization BID   calcitRIOL  0.25 mcg Oral Q3 days   Chlorhexidine Gluconate Cloth  6 each Topical Daily   clopidogrel  75 mg Oral Daily   feeding supplement (GLUCERNA SHAKE)  237 mL Oral TID BM   fluticasone  2 spray Each Nare Daily   furosemide  40 mg Intravenous BID   guaiFENesin-dextromethorphan  10 mL Oral QID   insulin aspart  0-15 Units Subcutaneous TID WC   insulin aspart  0-5 Units Subcutaneous QHS   insulin aspart  8 Units Subcutaneous TID WC   insulin glargine-yfgn  15 Units Subcutaneous Daily   isosorbide mononitrate  90 mg Oral Daily   lamoTRIgine  25 mg Oral QHS   linagliptin  5 mg Oral Daily   loratadine  10 mg Oral Daily   magic mouthwash  5 mL Oral QID   mouth rinse  15 mL Mouth Rinse BID   metoprolol tartrate  25 mg Oral BID   montelukast  10 mg Oral QHS  multivitamin with minerals  1 tablet Oral Daily   nystatin   Topical TID   pantoprazole  40 mg Oral Daily   [START ON 08/19/2021] predniSONE  10 mg Oral Q breakfast   Followed by   Derrill Memo ON 08/21/2021] predniSONE  10 mg Oral Q breakfast   [START ON 08/23/2021] predniSONE  5 mg Oral Q breakfast   Ensure  Max Protein  11 oz Oral Daily   rosuvastatin  20 mg Oral Daily   senna-docusate  2 tablet Oral BID   sertraline  100 mg Oral Daily   sodium chloride flush  10-40 mL Intracatheter Q12H   zinc sulfate  220 mg Oral Daily   Continuous Infusions:  heparin 700 Units/hr (08/18/21 1400)     LOS: 14 days    Time spent: over 30 min 40 min critical care time with AHRF 2/2 covid and mrsa pneumonia on HHFNC  Kayleen Memos, MD [facility]   To contact the attending provider between 7A-7P or the covering provider during after hours 7P-7A, please log into the web site [email or web address] and access using universal [place] password for that web site. If you do not have the password, please call the hospital operator.  08/18/2021, 2:32 PM

## 2021-08-18 NOTE — Progress Notes (Signed)
[place] for Heparin Indication: chest pain/ACS  Allergies  Allergen Reactions   Tetracycline Hives    Patient tolerated Doxycycline Dec 2020   Niacin Other (See Comments)    Mouth blisters   Niaspan [Niacin Er] Other (See Comments)    Mouth blisters   Sulfa Antibiotics Nausea Only and Other (See Comments)    "Tears up stomach"   Sulfonamide Derivatives Other (See Comments)    Reaction: per patient "tears her stomach up"   Codeine Nausea And Vomiting   Erythromycin Nausea And Vomiting   Hydromorphone Hcl Nausea And Vomiting   Morphine And Related Nausea And Vomiting   Nalbuphine Nausea And Vomiting    Nubain   Sulfasalazine Nausea Only and Other (See Comments)    per patient "tears her stomach up", "Tears up stomach"   Tape Rash and Other (See Comments)    No "plastic" tape," please----cloth tape only    Patient Measurements: Height: 5\' 1"  (154.9 cm) Weight: 86.2 kg (190 lb 0.6 oz) IBW/kg (Calculated) : 47.8 Heparin Dosing Weight: 66kg  Vital Signs: Temp: 97.2 F (36.2 C) (01/02 0500) Temp Source: Axillary (01/02 0000) BP: 117/75 (01/02 0600) Pulse Rate: 82 (01/02 0600)  Labs: Recent Labs    08/16/21 0430 08/16/21 0431 08/16/21 0431 08/16/21 0600 08/17/21 0550 08/18/21 0508  HGB  --  13.4   < >  --  12.7 12.1  HCT  --  41.0  --   --  38.4 37.1  PLT  --  315  --   --  249 224  HEPARINUNFRC 0.54  --   --   --  0.56 0.60  CREATININE  --   --   --  0.82 1.03* 0.82   < > = values in this interval not displayed.      Medications:  Infusions:   heparin 700 Units/hr (08/18/21 0600)    Assessment: 65 yoF, Covid +, requiring high O2 administration. Complaint of chest pain, Troponin elevated.  PTA Apixaban for Afib transitioned to heparin infusion in the setting of NSTEMI.   - Last dose Apixaban 12/24 at 0900.  Note that Apixaban will artificially elevate Hep levels, will dose based on aPTT until both levels  correlate Troponin: 16, 150, 928, 2835, peak at 4125 >> now downtrending  Heparin level falsely elevated due to recent apixaban.  Today, 08/18/21 - Heparin level therapeutic at 0.6 - CBC WNL - no bleeding reported  - Originally planned for cardiac cath either 12/29 or 12/30; however this was delayed d/t O2 requirements. Will need to remain on heparin per cardiology.  Tentative plan for Cath on 08/19/21  Goal of Therapy:  Heparin level 0.3-0.7 units/ml Monitor platelets by anticoagulation protocol: Yes   Plan:  Continue heparin drip at 700 units/hour Daily heparin level and CBC F/u cardiac cath plans and if/when heparin should be held F/u ability to transition back to [employer] PharmD, BCPS [facility] main pharmacy [phone] 08/18/2021 7:28 AM

## 2021-08-19 DIAGNOSIS — I214 Non-ST elevation (NSTEMI) myocardial infarction: Secondary | ICD-10-CM | POA: Diagnosis not present

## 2021-08-19 DIAGNOSIS — J1282 Pneumonia due to coronavirus disease 2019: Secondary | ICD-10-CM | POA: Diagnosis not present

## 2021-08-19 DIAGNOSIS — I5043 Acute on chronic combined systolic (congestive) and diastolic (congestive) heart failure: Secondary | ICD-10-CM | POA: Diagnosis not present

## 2021-08-19 DIAGNOSIS — J9621 Acute and chronic respiratory failure with hypoxia: Secondary | ICD-10-CM | POA: Diagnosis not present

## 2021-08-19 DIAGNOSIS — U071 COVID-19: Secondary | ICD-10-CM | POA: Diagnosis not present

## 2021-08-19 LAB — BASIC METABOLIC PANEL
Anion gap: 9 (ref 5–15)
BUN: 38 mg/dL — ABNORMAL HIGH (ref 8–23)
CO2: 29 mmol/L (ref 22–32)
Calcium: 9.2 mg/dL (ref 8.9–10.3)
Chloride: 93 mmol/L — ABNORMAL LOW (ref 98–111)
Creatinine, Ser: 0.9 mg/dL (ref 0.44–1.00)
GFR, Estimated: >60 mL/min (ref >60)
Glucose, Bld: 192 mg/dL — ABNORMAL HIGH (ref 70–99)
Potassium: 3.6 mmol/L (ref 3.5–5.1)
Sodium: 131 mmol/L — ABNORMAL LOW (ref 135–145)

## 2021-08-19 LAB — GLUCOSE, CAPILLARY
Glucose-Capillary: 214 mg/dL — ABNORMAL HIGH (ref 70–99)
Glucose-Capillary: 497 mg/dL — ABNORMAL HIGH (ref 70–99)
Glucose-Capillary: 235 mg/dL — ABNORMAL HIGH (ref 70–99)
Glucose-Capillary: 330 mg/dL — ABNORMAL HIGH (ref 70–99)
Glucose-Capillary: 205 mg/dL — ABNORMAL HIGH (ref 70–99)

## 2021-08-19 LAB — TROPONIN I (HIGH SENSITIVITY)
Troponin I (High Sensitivity): 93 ng/L — ABNORMAL HIGH (ref <18)
Troponin I (High Sensitivity): 76 ng/L — ABNORMAL HIGH (ref <18)

## 2021-08-19 LAB — HEPARIN LEVEL (UNFRACTIONATED): Heparin Unfractionated: 0.45 IU/mL (ref 0.30–0.70)

## 2021-08-19 MED ORDER — SODIUM CHLORIDE 0.9 % IV BOLUS: 500.0000 mL | Freq: Once | INTRAVENOUS | Status: DC

## 2021-08-19 MED ORDER — SODIUM CHLORIDE 0.9 % IV BOLUS
1000.0000 mL | INTRAVENOUS | Status: AC
Start: 2021-08-19 — End: 2021-08-19
  Administered 2021-08-19: 1000 mL via INTRAVENOUS

## 2021-08-19 MED ORDER — FUROSEMIDE 40 MG PO TABS
40.0000 mg | ORAL_TABLET | Freq: Every day | ORAL | Status: DC
  Administered 2021-08-20: 40 mg via ORAL
  Filled 2021-08-19: qty 1

## 2021-08-19 MED ORDER — PHENYLEPHRINE HCL-NACL 20-0.9 MG/250ML-% IV SOLN
0.0000 ug/min | INTRAVENOUS | Status: DC
  Administered 2021-08-19: 22:00:00 50 ug/min via INTRAVENOUS
  Administered 2021-08-19: 13:00:00 20 ug/min via INTRAVENOUS
  Filled 2021-08-19 (×3): qty 250

## 2021-08-19 MED ORDER — MIDODRINE HCL 5 MG PO TABS
10.0000 mg | ORAL_TABLET | ORAL | Status: DC
Start: 2021-08-19 — End: 2021-08-19
  Filled 2021-08-19: qty 2

## 2021-08-19 MED ORDER — SODIUM CHLORIDE 0.9 % IV BOLUS
1000.0000 mL | Freq: Once | INTRAVENOUS | Status: DC
Start: 2021-08-19 — End: 2021-08-19

## 2021-08-19 MED ORDER — NOREPINEPHRINE 4 MG/250ML-% IV SOLN: 0.0000 ug/min | INTRAVENOUS | Status: DC

## 2021-08-19 MED ORDER — NOREPINEPHRINE 4 MG/250ML-% IV SOLN
INTRAVENOUS | Status: AC
  Administered 2021-08-19: 2 ug/min via INTRAVENOUS
  Filled 2021-08-19: qty 250

## 2021-08-19 NOTE — Progress Notes (Signed)
Inpatient Diabetes Program Recommendations  AACE/ADA: New Consensus Statement on Inpatient Glycemic Control (2015)  Target Ranges:  Prepandial:   less than 140 mg/dL      Peak postprandial:   less than 180 mg/dL (1-2 hours)      Critically ill patients:  140 - 180 mg/dL    Latest Reference Range & Units 08/19/21 08:09 08/19/21 12:12  Glucose-Capillary 70 - 99 mg/dL 214 (H)  13 units Novolog 497 (H)     Home DM Meds: Basaglar 12 units q AM     Novolog 4-10 units tid with meals     Amaryl 4 mg daily     Metformin 500 mg bid  Current Orders: Semglee 15 units daily     Novolog 8 units TID with meals     Novolog Moderate Correction Scale/ SSI (0-15 units) TID AC + HS     Tradjenta 5 mg daily    Prednisone decreased to 10 mg daily this AM however afternoon CBGs still significantly elevated  MD- Please consider increasing the Novolog Meal Coverage to 12 units TID with meals     --Will follow patient during hospitalization--  Wyn Quaker RN, MSN, CDE Diabetes Coordinator Inpatient Glycemic Control Team Team Pager: [phone] (8a-5p)

## 2021-08-19 NOTE — Progress Notes (Signed)
[place] for Heparin Indication: chest pain/ACS  Allergies  Allergen Reactions   Tetracycline Hives    Patient tolerated Doxycycline Dec 2020   Niacin Other (See Comments)    Mouth blisters   Niaspan [Niacin Er] Other (See Comments)    Mouth blisters   Sulfa Antibiotics Nausea Only and Other (See Comments)    "Tears up stomach"   Sulfonamide Derivatives Other (See Comments)    Reaction: per patient "tears her stomach up"   Codeine Nausea And Vomiting   Erythromycin Nausea And Vomiting   Hydromorphone Hcl Nausea And Vomiting   Morphine And Related Nausea And Vomiting   Nalbuphine Nausea And Vomiting    Nubain   Sulfasalazine Nausea Only and Other (See Comments)    per patient "tears her stomach up", "Tears up stomach"   Tape Rash and Other (See Comments)    No "plastic" tape," please----cloth tape only    Patient Measurements: Height: 5\' 1"  (154.9 cm) Weight: 86.3 kg (190 lb 4.1 oz) IBW/kg (Calculated) : 47.8 Heparin Dosing Weight: 66kg  Vital Signs: Temp: 98.1 F (36.7 C) (01/03 0400) Temp Source: Oral (01/03 0400) BP: 126/84 (01/03 0600) Pulse Rate: 80 (01/03 0200)  Labs: Recent Labs    08/17/21 0550 08/18/21 0508 08/19/21 0430  HGB 12.7 12.1  --   HCT 38.4 37.1  --   PLT 249 224  --   HEPARINUNFRC 0.56 0.60 0.45  CREATININE 1.03* 0.82 0.90      Medications:  Infusions:   heparin 700 Units/hr (08/19/21 0600)    Assessment: 65 yoF, Covid +, requiring high O2 administration. Complaint of chest pain, Troponin elevated.  PTA Apixaban for Afib transitioned to heparin infusion in the setting of NSTEMI.   - Last dose Apixaban 12/24 at 0900.  Note that Apixaban will artificially elevate Hep levels, will dose based on aPTT until both levels correlate Troponin: 16, 150, 928, 2835, peak at 4125 >> now downtrending  Heparin level falsely elevated due to recent apixaban.  Today, 08/19/21 - Heparin level therapeutic at  0.45 - CBC WNL - no bleeding reported  - Originally planned for cardiac cath either 12/29 or 12/30; however this was delayed d/t O2 requirements. Will need to remain on heparin per cardiology.  Tentative plan for Cath on 08/19/21  Goal of Therapy:  Heparin level 0.3-0.7 units/ml Monitor platelets by anticoagulation protocol: Yes   Plan:  Continue heparin drip at 700 units/hour Daily heparin level and CBC F/u cardiac cath plans and if/when heparin should be held F/u ability to transition back to [employer] PharmD, BCPS [facility] main pharmacy [phone] 08/19/2021 7:24 AM

## 2021-08-19 NOTE — Progress Notes (Addendum)
Progress Note  Patient Name: Katherine [name] Date of Encounter: 08/19/2021  Primary Cardiologist: [name], MD  Subjective   Pt reports about an hour ago developing recurrent mild chest discomfort. Described as cold air in her chest. Also complains of ear pain. No acute SOB.  Inpatient Medications    Scheduled Meds:  (feeding supplement) PROSource Plus  30 mL Oral BID BM   alteplase  2 mg Intracatheter Once   arformoterol  15 mcg Nebulization BID   vitamin C  500 mg Oral Daily   aspirin  81 mg Oral Daily   budesonide  2 mL Nebulization BID   calcitRIOL  0.25 mcg Oral Q3 days   Chlorhexidine Gluconate Cloth  6 each Topical Daily   clopidogrel  75 mg Oral Daily   feeding supplement (GLUCERNA SHAKE)  237 mL Oral TID BM   fluticasone  2 spray Each Nare Daily   furosemide  40 mg Intravenous BID   guaiFENesin-dextromethorphan  10 mL Oral QID   insulin aspart  0-15 Units Subcutaneous TID WC   insulin aspart  0-5 Units Subcutaneous QHS   insulin aspart  8 Units Subcutaneous TID WC   insulin glargine-yfgn  15 Units Subcutaneous Daily   isosorbide mononitrate  90 mg Oral Daily   lamoTRIgine  25 mg Oral QHS   linagliptin  5 mg Oral Daily   loratadine  10 mg Oral Daily   magic mouthwash  5 mL Oral QID   mouth rinse  15 mL Mouth Rinse BID   metoprolol tartrate  25 mg Oral BID   montelukast  10 mg Oral QHS   multivitamin with minerals  1 tablet Oral Daily   nystatin   Topical TID   pantoprazole  40 mg Oral Daily   predniSONE  10 mg Oral Q breakfast   Followed by   Derrill Memo ON 08/21/2021] predniSONE  10 mg Oral Q breakfast   [START ON 08/23/2021] predniSONE  5 mg Oral Q breakfast   Ensure Max Protein  11 oz Oral Daily   rosuvastatin  20 mg Oral Daily   senna-docusate  2 tablet Oral BID   sertraline  100 mg Oral Daily   sodium chloride flush  10-40 mL Intracatheter Q12H   zinc sulfate  220 mg Oral Daily   Continuous Infusions:  heparin 700 Units/hr (08/19/21 0600)   PRN  Meds: acetaminophen **OR** acetaminophen, cyclobenzaprine, hydrALAZINE, ipratropium-albuterol, LORazepam, menthol-cetylpyridinium, morphine injection, nitroGLYCERIN, ondansetron (ZOFRAN) IV, oxyCODONE, promethazine, sodium chloride, sodium chloride flush   Vital Signs    Vitals:   08/19/21 0730 08/19/21 0755 08/19/21 0800 08/19/21 0820  BP:   101/61   Pulse:      Resp: (!) 21 18 15    Temp:   98.2 F (36.8 C)   TempSrc:   Axillary   SpO2: 99% 96%  94%  Weight:      Height:        Intake/Output Summary (Last 24 hours) at 08/19/2021 0906 Last data filed at 08/19/2021 0600 Gross per 24 hour  Intake 180.85 ml  Output 1600 ml  Net -1419.15 ml   Last 3 Weights 08/19/2021 08/18/2021 08/17/2021  Weight (lbs) 190 lb 4.1 oz 190 lb 0.6 oz 191 lb 12.8 oz  Weight (kg) 86.3 kg 86.2 kg 87 kg     Telemetry    NSR then appears to have developed atrial fib around 9am, HR 90s-100s - Personally Reviewed  Physical Exam   GEN: No acute distress, chronically debilitated  appearing HEENT: Normocephalic, atraumatic, sclera non-icteric. Neck: No JVD or bruits. Cardiac: Irregularly irregular, rate mildly elevated, no murmurs, rubs, or gallops.  Respiratory: Diminished BS throughout with quiet wheezing throughout GI: Soft, nontender, non-distended, BS +x 4. MS: no deformity. Extremities: No clubbing or cyanosis. No edema. Distal pedal pulses are 2+ and equal bilaterally. Neuro:  AAOx3. Follows commands. Psych:  Responds to questions appropriately with a normal affect.  Labs    High Sensitivity Troponin:   Recent Labs  Lab 08/11/21 0539 08/11/21 1040 08/11/21 1431 08/12/21 0311 08/14/21 1845  TROPONINIHS 1,660* 1,171* 877* 926* 351*      Cardiac EnzymesNo results for input(s): TROPONINI in the last 168 hours. No results for input(s): TROPIPOC in the last 168 hours.   Chemistry Recent Labs  Lab 08/13/21 0410 08/15/21 0440 08/16/21 0600 08/17/21 0550 08/18/21 0508 08/19/21 0430  NA 131*  135   < > 129* 131* 131*  K 3.6 3.3*   < > 3.4* 5.0 3.6  CL 92* 94*   < > 95* 94* 93*  CO2 29 31   < > 27 30 29   GLUCOSE 189* 73   < > 337* 164* 192*  BUN 47* 58*   < > 56* 43* 38*  CREATININE 0.89 0.92   < > 1.03* 0.82 0.90  CALCIUM 9.1 9.4   < > 8.6* 9.3 9.2  PROT 6.1* 6.1*  --   --   --   --   ALBUMIN 2.4* 2.4*  --   --   --   --   AST 16 19  --   --   --   --   ALT 15 16  --   --   --   --   ALKPHOS 138* 138*  --   --   --   --   BILITOT 0.7 0.7  --   --   --   --   GFRNONAA >60 >60   < > >60 >60 >60  ANIONGAP 10 10   < > 7 7 9    < > = values in this interval not displayed.     Hematology Recent Labs  Lab 08/16/21 0431 08/17/21 0550 08/18/21 0508  WBC 12.8* 9.8 10.6*  RBC 4.35 4.01 3.87  HGB 13.4 12.7 12.1  HCT 41.0 38.4 37.1  MCV 94.3 95.8 95.9  MCH 30.8 31.7 31.3  MCHC 32.7 33.1 32.6  RDW 15.0 15.4 15.2  PLT 315 249 224    BNP Recent Labs  Lab 08/13/21 0457  BNP 331.3*     DDimer  Recent Labs  Lab 08/13/21 0410  DDIMER 0.93*     Radiology    No results found.  Cardiac Studies   2D Echo 08/09/21    1. Left ventricular ejection fraction, by estimation, is 45 to 50%. The  left ventricle has mildly decreased function. The left ventricle  demonstrates regional wall motion abnormalities (see scoring  diagram/findings for description). There is mild left  ventricular hypertrophy. Left ventricular diastolic function could not be  evaluated.   2. Right ventricular systolic function is normal. The right ventricular  size is normal.   3. The mitral valve is grossly normal. Trivial mitral valve  regurgitation.   4. The aortic valve has an indeterminant number of cusps. There is  moderate calcification of the aortic valve. Aortic valve regurgitation is  not visualized. Aortic valve sclerosis/calcification is present, without  any evidence of aortic stenosis.   Comparison(s): Prior images  reviewed side by side. Wall motion more  similar to last study  with Definity contrast on 07/12/2020, although LVEF  somewhat better.   FINDINGS   Patient Profile     66 y.o. 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 Last cath/PCI was in 02/2021 with complex findings, see EMR for full report. She was admitted 08/04/2021 with acute on chronic hypoxic respiratory failure found to have Covid-19 infection with MRSA pneumonia in setting of COPD. Hospital course notable for LV dysfunction, uncontrolled DM, pressure ulcer of skin POA, hyponatremia, hypokalemia, generalized weakness. Found to have NSTEMI with troponin peak over 4000.  Assessment & Plan    1. NSTEMI with history of CAD s/p CABG 1994 and numerous PCIs since that time - peak troponin 4125 on 08/10/21 - 2D echo 08/09/21 EF 45-50%, mild LVH, no significant AS seen - per earlier notes felt to represent possible graft loss - will discuss timing of cath - pt remains on ASA, Plavix, heparin at this time (note she was also on Eliquis as OP), also on metoprolol, rosuvastatin - this AM developed recurrent mild chest discomfort in setting of what appears to be recurrent atrial fib -> will repeat EKG and troponins now and discuss with Dr. Stanford Breed  2. Acute combined CHF - LVEF 45-50% by echo 08/09/21, down from 55-60% in 01/2021 - may be combination of ischemic + stress induced cardiomyopathy - medical therapy limited by hypotension - currently on Imdur 90mg , Lopressor 25mg  BID - still getting IV Lasix 40mg  BID started 08/13/21 -  will review with MD, BUN remains elevated but Cr stable, -14L this adm  3. Paroxysmal atrial fibrillation - currently on heparin (Eliquis PTA) - appears to have gone into atrial fib this AM with marginal increase in rate - will repeat EKG now, see #1  4. Acute on chronic respiratory failure in setting of Covid-19 and MRSA PNA - treated with remdesivir, IV steroids, doxycycline - still requiring 6L at this time - per medicine team - asked nursing to review with IM re: precautions as her infection tag indicates to continue through 08/24/21  5. H/o kidney transplant - on chronic immunosuppressive therapy  6. H/o Torsades in setting of Tikosyn, electrolyte disturbance - Mg level normal earlier this admission, will order f/u for AM - last K 3.6 but was 5.0 day prior, continue to follow  7. Carotid disease - can re-evaluate updated duplex as OP  For questions or updates, please contact [place] Please consult [email or web address] for contact info under Cardiology/STEMI.  Signed, Charlie Pitter, PA-C 08/19/2021, 9:06 AM   As above, patient seen and examined.  She had chest pain earlier today which has now resolved.  Continue aspirin, Plavix, heparin, beta-blocker and nitrates.  Recycle enzymes.  She is improving from recent COVID/pneumonia.  We will likely plan catheterization on Thursday.  Decrease Lasix to 40 mg daily and will hold prior to catheterization.  Will resume apixaban for atrial fibrillation once all procedures complete.  Kirk Ruths, MD

## 2021-08-19 NOTE — Progress Notes (Signed)
PROGRESS NOTE    Katherine [name]  WNI:[id number] DOB: 01/12/1956 DOA: 08/04/2021 PCP: [name], MD  Chief Complaint  Patient presents with   generalized weakness    Brief Narrative:  This is a 66 year old female with anxiety, depression, CAD/CABG, chronic hypoxic respiratory failure related to COPD on 3 L of oxygen at home, type 2 diabetes mellitus, end-stage renal disease status post renal transplant, GERD, obesity, hypertension paroxysmal atrial fibrillation, PAD who presented to the hospital on 12/19 with shortness of breath and generalized weakness.  She was found to have a pulse ox of 84% on her usual oxygen, noted to be wheezing and had a cough with yellow sputum.  COVID PCR noted to be positive.  She's been admitted for COVID 19 pneumonia and now found to have MRSA pneumonia.  She's had progressively worsening O2 needs, now on heated high flow.    See below for additional details   Seen by psychiatry on 08/16/2021 due to concern for polypharmacy.  Ativan dose reduced to 12.5 mg twice daily as needed for anxiety.  Continue Zoloft 100 mg daily, appreciate psychiatry's assistance.  08/19/2021: Seen and examined at her bedside.  Reports discomfort in her chest earlier which have now resolved.  EKG non acute.   Assessment & Plan:   Principal Problem:   Acute on chronic respiratory failure with hypoxia (HCC) Active Problems:   Hypomagnesemia   Dyslipidemia, goal LDL below 70   NSTEMI (non-ST elevated myocardial infarction) (HCC)   CAD S/P percutaneous coronary angioplasty - PCI x 5 to SVG-D1   Essential hypertension   PAD (peripheral artery disease) (HCC)   Paroxysmal atrial fibrillation (HCC); CHA2DS2VASc score F, HTN, CAD, CVA = 5   OSA (obstructive sleep apnea)   GAD (generalized anxiety disorder)   Acute CHF (congestive heart failure) ([place])   History of renal transplant   Uncontrolled type 2 diabetes mellitus with hyperglycemia, with long-term current use of insulin  (HCC) with neuropathy   Pressure injury of skin   COVID-19 virus infection   Obesity (BMI 30-39.9)   Chronic diastolic CHF (congestive heart failure) (HCC)   MRSA pneumonia (HCC)   Positive D dimer  * Acute on chronic respiratory failure with hypoxia ([place])- (present on admission) Due to COVID-19 infection with MRSA  pneumonia in setting of COPD.  Possible contribution from HF. Uses 2 LPM at home COVID test + on 12/18 CXR 12/24 with worsening severe diffuse bilateral airspace disease CT PE protocol chest 08/05/2021: limited for PE, but negative, extensive heterogenous and GGO throughout lungs  Satting in 90's, on 25 L HHFNC (slowly improving) Elevated BNP -> lasix as tolerated Completed remdesivir.  Continue IV steroids for covid and abx to cover for MRSA pneumonia She's immunosuppressed on antirejection meds with renal transplant (not candidate for baricitinib or actemra with MRSA infection and immunosuppression).  Poor prognosis with immunosuppression and covid.  COVID-19 Labs  Recent Labs    08/10/21 0515 08/11/21 0309  DDIMER 2.01* 1.43*  CRP  --  2.4*    COVID-19 Labs  Recent Labs    08/10/21 0515 08/11/21 0309  DDIMER 2.01* 1.43*  CRP  --  2.4*    Lab Results  Component Value Date   SARSCOV2NAA POSITIVE (A) 08/04/2021   SARSCOV2NAA NEGATIVE 05/12/2021   [place] NEGATIVE 04/19/2021   [place] NEGATIVE 03/30/2021     Positive D dimer On anticoagulation Follow LE [place] - negative  MRSA pneumonia ([place]) 12/20 sputum cx with MRSA (and candida albicans -  suspect this is contaminant/colonizer)  On doxycycline, plan for 7 days (12/23 - present) As above  Chronic diastolic CHF (congestive heart failure) ([place])- (present on admission) Grade 1 diastolic dysfunction  Obesity (BMI 30-39.9)- (present on admission) Body mass index is 37.03 kg/m.  Placing the patient at high risk of poor outcome.  Monitor.  COVID-19 virus infection- (present on admission) As  above Steroids, remdesivir (s/p 5 doses) Tapering steroids  Pressure injury of skin- (present on admission) Stage II, medial rectal ulcer. Present on admission. 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 Receives prednisone and Imuran. Placing the patient at high risk for poor outcome secondary to chronic immunosuppressive status  Acute CHF (congestive heart failure) (HCC) Elevated BNP Follow echo - EF 45-50%, RWMA (see report) Lasix as tolerated, follow renal function Strict I/O, daily weights - net negative  GAD (generalized anxiety disorder)- (present on admission) Continue Zoloft and Ativan.  OSA (obstructive sleep apnea)- (present on admission) Does not use CPAP at night after lap band.  Paroxysmal atrial fibrillation (HCC); CHA2DS2VASc score F, HTN, CAD, CVA = 5- (present on admission) Continue Metoprolol (dose reduced due to brady) and Eliquis -> now on heparin gtt  PAD (peripheral artery disease) ([place])- (present on admission) Prior history.  Monitor.  Essential hypertension Recently improved, follow Continue home BP meds, consider adding meds pending trend  CAD S/P percutaneous coronary angioplasty - PCI x 5 to SVG-D1 Prior history.  Monitor. W/u for chest pain above  NSTEMI (non-ST elevated myocardial infarction) ([place]) Episode of CP similar to prior heart attacks on 12/24 Continue nitro prn  Was already anticoagulated on eliquis, antiplatelet on plavix -> given concerning symptoms/rising troponin -> transitioned to heparin gtt, aspirin added to placix EF 45-50%, RWMA's (see report) Cardiology c/s, appreciate assistance -> plan for aspirin, plavix, imdur (increased dose),  lopressor, crestor - consider arb/arni if BP remains high, consider cath pending clinical status (note 12/25) Would want her O2 needs to improve prior to diagnostic cath - still has significant O2 needs Ok to work with PT/OT if asymptomatic  Dyslipidemia, goal LDL below 70- (present on admission) Continue statin.  Hypomagnesemia- (present on admission) Replaced.  Improving hyponatremia, serum sodium 129 from 135. Resolved Hypomagnesemia, serum mag 2.2. Worsening hyperglycemia, likely exacerbated by steroids.  Increase Semglee dose to 15 unit daily, NovoLog 8 units 3 times daily.  Hypercarbia is improving  Persistent Generalized weakness PT OT assessed and recommended home health PT OT Continue fall precautions Patient encouraged to participate with therapies Continue PT OT  MRSA pneumonia Personally reviewed last chest x-ray showing improvement of infiltrates. Completed course of IV doxycycline on 08/15/2021. Repeated procalcitonin level less than 0.10   Resolved hypokalemia K+ 3.3>> 4.1> 3.4>5.0  Chronic anxiety/depression with concern for polypharmacy Seen by psychiatry, appreciate recommendations.   DVT prophylaxis: Heparin gtt Code Status: Full Family Communication: None at bedside. Disposition:   Status is: Inpatient  Remains inpatient appropriate because: sig O2 requirement, NSTEMI       Consultants:  Cardiology Psychiatry  Procedures:  echo IMPRESSIONS     1. Left ventricular ejection fraction, by estimation, is 45 to 50%. The  left ventricle has mildly decreased function. The left ventricle  demonstrates regional wall motion abnormalities (see scoring  diagram/findings for description).  There is mild left  ventricular hypertrophy. Left ventricular diastolic function could not be  evaluated.   2. Right ventricular systolic function is normal. The right ventricular  size is normal.   3. The mitral valve is grossly normal. Trivial mitral valve   regurgitation.   4. The aortic valve has an indeterminant number of cusps. There is  moderate calcification of the aortic valve. Aortic valve regurgitation is  not visualized. Aortic valve sclerosis/calcification is present, without  any evidence of aortic stenosis.   Comparison(s): Prior images reviewed side by side. Wall motion more  similar to last study with Definity contrast on 07/12/2020, although LVEF  somewhat better.   Summary:  BILATERAL:  -No evidence of popliteal cyst, bilaterally.      Antimicrobials:  Anti-infectives (From admission, onward)    Start     Dose/Rate Route Frequency Ordered Stop   08/08/21 1400  doxycycline (VIBRAMYCIN) 100 mg in sodium chloride 0.9 % 250 mL IVPB        100 mg 125 mL/hr over 120 Minutes Intravenous Every 12 hours 08/08/21 1327 08/15/21 1046   08/06/21 1000  remdesivir 100 mg in sodium chloride 0.9 % 100 mL IVPB  Status:  Discontinued       See Hyperspace for full Linked Orders Report.   100 mg 200 mL/hr over 30 Minutes Intravenous Daily 08/05/21 0720 08/05/21 0720   08/06/21 1000  remdesivir 100 mg in sodium chloride 0.9 % 100 mL IVPB        100 mg 200 mL/hr over 30 Minutes Intravenous Daily 08/05/21 0721 08/09/21 0937   08/05/21 1000  remdesivir 100 mg in sodium chloride 0.9 % 100 mL IVPB  Status:  Discontinued       See Hyperspace for full Linked Orders Report.   100 mg 200 mL/hr over 30 Minutes Intravenous Daily 08/04/21 0819 08/05/21 0720   08/05/21 1000  remdesivir 200 mg in sodium chloride 0.9% 250 mL IVPB        200 mg 580 mL/hr over 30 Minutes Intravenous Once 08/05/21 0719 08/05/21 1150   08/05/21 1000  cefTRIAXone (ROCEPHIN) 2 g in sodium chloride 0.9 % 100 mL IVPB        2 g 200 mL/hr over 30 Minutes Intravenous Every 24 hours 08/05/21 0815 08/09/21 1032   08/05/21 1000  azithromycin (ZITHROMAX) 500 mg in sodium chloride 0.9 % 250 mL IVPB  Status:  Discontinued        500 mg 250 mL/hr over 60 Minutes Intravenous Every  24 hours 08/05/21 0815 08/05/21 0907   08/04/21 0930  remdesivir 200 mg in sodium chloride 0.9% 250 mL IVPB  Status:  Discontinued       See Hyperspace for full Linked Orders Report.   200 mg 580 mL/hr over 30 Minutes Intravenous Once 08/04/21 0819 08/05/21 0720        Objective: Vitals:   08/19/21 1300 08/19/21 1340 08/19/21 1345 08/19/21 1600  BP: (!) 79/34 (!) 132/50    Pulse: 79  77   Resp: 18  16   Temp:    98.3 F (36.8 C)  TempSrc:    Oral  SpO2: (!) 89%  92%   Weight:      Height:        Intake/Output Summary (Last 24 hours) at 08/19/2021 1829 Last data filed at 08/19/2021 1503 Gross per 24 hour  Intake 1225.89 ml  Output 1900 ml  Net -674.11 ml   Filed Weights   08/17/21 0500  08/18/21 0500 08/19/21 0432  Weight: 87 kg 86.2 kg 86.3 kg    Examination:  General: Frail no acute distress. A&O x3 Cardiovascular: RRR no rubs or gallops Lungs: CTA, no wheezes, no rales.  Abdomen: Soft non tender normal bowel sounds Neurological: Nonfocal exam Skin: No rashes no ulcerative lesions Extremities: No lower extremity edema  Data Reviewed: I have personally reviewed following labs and imaging studies  CBC: Recent Labs  Lab 08/14/21 0410 08/15/21 0440 08/16/21 0431 08/17/21 0550 08/18/21 0508  WBC 9.5 12.1* 12.8* 9.8 10.6*  HGB 12.5 13.2 13.4 12.7 12.1  HCT 36.8 39.9 41.0 38.4 37.1  MCV 94.6 94.8 94.3 95.8 95.9  PLT 311 308 315 249 644    Basic Metabolic Panel: Recent Labs  Lab 08/13/21 0410 08/15/21 0440 08/16/21 0600 08/17/21 0550 08/18/21 0508 08/19/21 0430  NA 131* 135 132* 129* 131* 131*  K 3.6 3.3* 4.1 3.4* 5.0 3.6  CL 92* 94* 94* 95* 94* 93*  CO2 29 31 29 27 30 29   GLUCOSE 189* 73 128* 337* 164* 192*  BUN 47* 58* 62* 56* 43* 38*  CREATININE 0.89 0.92 0.82 1.03* 0.82 0.90  CALCIUM 9.1 9.4 9.0 8.6* 9.3 9.2  MG 1.9 2.2  --   --   --   --   PHOS 3.5 4.3  --   --   --   --     GFR: Estimated Creatinine Clearance: 63 mL/min (by C-G formula  based on SCr of 0.9 mg/dL).  Liver Function Tests: Recent Labs  Lab 08/13/21 0410 08/15/21 0440  AST 16 19  ALT 15 16  ALKPHOS 138* 138*  BILITOT 0.7 0.7  PROT 6.1* 6.1*  ALBUMIN 2.4* 2.4*    CBG: Recent Labs  Lab 08/18/21 2131 08/19/21 0809 08/19/21 1212 08/19/21 1238 08/19/21 1626  GLUCAP 422* 214* 497* 235* 330*     No results found for this or any previous visit (from the past 240 hour(s)).        Radiology Studies: No results found.      Scheduled Meds:  (feeding supplement) PROSource Plus  30 mL Oral BID BM   alteplase  2 mg Intracatheter Once   arformoterol  15 mcg Nebulization BID   vitamin C  500 mg Oral Daily   aspirin  81 mg Oral Daily   budesonide  2 mL Nebulization BID   calcitRIOL  0.25 mcg Oral Q3 days   Chlorhexidine Gluconate Cloth  6 each Topical Daily   clopidogrel  75 mg Oral Daily   feeding supplement (GLUCERNA SHAKE)  237 mL Oral TID BM   fluticasone  2 spray Each Nare Daily   furosemide  40 mg Oral Daily   guaiFENesin-dextromethorphan  10 mL Oral QID   insulin aspart  0-15 Units Subcutaneous TID WC   insulin aspart  0-5 Units Subcutaneous QHS   insulin aspart  8 Units Subcutaneous TID WC   insulin glargine-yfgn  15 Units Subcutaneous Daily   isosorbide mononitrate  90 mg Oral Daily   lamoTRIgine  25 mg Oral QHS   linagliptin  5 mg Oral Daily   loratadine  10 mg Oral Daily   magic mouthwash  5 mL Oral QID   mouth rinse  15 mL Mouth Rinse BID   metoprolol tartrate  25 mg Oral BID   montelukast  10 mg Oral QHS   multivitamin with minerals  1 tablet Oral Daily   nystatin   Topical TID   pantoprazole  40 mg Oral Daily   predniSONE  10 mg Oral Q breakfast   Followed by   Derrill Memo ON 08/21/2021] predniSONE  10 mg Oral Q breakfast   [START ON 08/23/2021] predniSONE  5 mg Oral Q breakfast   Ensure Max Protein  11 oz Oral Daily   rosuvastatin  20 mg Oral Daily   senna-docusate  2 tablet Oral BID   sertraline  100 mg Oral Daily    sodium chloride flush  10-40 mL Intracatheter Q12H   zinc sulfate  220 mg Oral Daily   Continuous Infusions:  heparin 700 Units/hr (08/19/21 1503)   phenylephrine (NEO-SYNEPHRINE) Adult infusion 30 mcg/min (08/19/21 1503)     LOS: 15 days    Time spent: over 30 min 40 min critical care time with AHRF 2/2 covid and mrsa pneumonia on HHFNC  Kayleen Memos, MD [facility]   To contact the attending provider between 7A-7P or the covering provider during after hours 7P-7A, please log into the web site [email or web address] and access using universal [place] password for that web site. If you do not have the password, please call the hospital operator.  08/19/2021, 6:29 PM

## 2021-08-19 NOTE — Progress Notes (Signed)
Charge RN made aware of patient receiving 30 mg Imdur with AM medication pass- see MAR. [place] Crane MD notified- New orders received and initiated.

## 2021-08-19 NOTE — Progress Notes (Signed)
OT Cancellation Note  Patient Details Name: Katherine [name] MRN: [id number] DOB: [birth year]   Cancelled Treatment:    Reason Eval/Treat Not Completed: Patient not medically ready Patient was having chest pain this AM while awaiting EKG and troponin testing. OT to continue to follow and check back as schedule will allow. Jackelyn Poling OTR/L, [facility] Office# [phone] Pager# [phone]  Marcellina Millin 08/19/2021, 4:32 PM

## 2021-08-20 DIAGNOSIS — I2510 Atherosclerotic heart disease of native coronary artery without angina pectoris: Secondary | ICD-10-CM | POA: Diagnosis not present

## 2021-08-20 DIAGNOSIS — I5032 Chronic diastolic (congestive) heart failure: Secondary | ICD-10-CM | POA: Diagnosis not present

## 2021-08-20 DIAGNOSIS — I214 Non-ST elevation (NSTEMI) myocardial infarction: Secondary | ICD-10-CM | POA: Diagnosis not present

## 2021-08-20 DIAGNOSIS — U071 COVID-19: Secondary | ICD-10-CM | POA: Diagnosis not present

## 2021-08-20 DIAGNOSIS — J9621 Acute and chronic respiratory failure with hypoxia: Secondary | ICD-10-CM | POA: Diagnosis not present

## 2021-08-20 LAB — GLUCOSE, CAPILLARY
Glucose-Capillary: 169 mg/dL — ABNORMAL HIGH (ref 70–99)
Glucose-Capillary: 201 mg/dL — ABNORMAL HIGH (ref 70–99)
Glucose-Capillary: 205 mg/dL — ABNORMAL HIGH (ref 70–99)
Glucose-Capillary: 403 mg/dL — ABNORMAL HIGH (ref 70–99)
Glucose-Capillary: 414 mg/dL — ABNORMAL HIGH (ref 70–99)

## 2021-08-20 LAB — CBC
HCT: 37.5 % (ref 36.0–46.0)
Hemoglobin: 12.2 g/dL (ref 12.0–15.0)
MCH: 31.9 pg (ref 26.0–34.0)
MCHC: 32.5 g/dL (ref 30.0–36.0)
MCV: 98.2 fL (ref 80.0–100.0)
Platelets: 225 10*3/uL (ref 150–400)
RBC: 3.82 MIL/uL — ABNORMAL LOW (ref 3.87–5.11)
RDW: 15.2 % (ref 11.5–15.5)
WBC: 10.9 10*3/uL — ABNORMAL HIGH (ref 4.0–10.5)
nRBC: 0 % (ref 0.0–0.2)

## 2021-08-20 LAB — HEPARIN LEVEL (UNFRACTIONATED)
Heparin Unfractionated: 0.13 IU/mL — ABNORMAL LOW (ref 0.30–0.70)
Heparin Unfractionated: 0.68 IU/mL (ref 0.30–0.70)
Heparin Unfractionated: 0.51 IU/mL (ref 0.30–0.70)

## 2021-08-20 LAB — BASIC METABOLIC PANEL
Anion gap: 6 (ref 5–15)
BUN: 28 mg/dL — ABNORMAL HIGH (ref 8–23)
CO2: 29 mmol/L (ref 22–32)
Calcium: 8.8 mg/dL — ABNORMAL LOW (ref 8.9–10.3)
Chloride: 101 mmol/L (ref 98–111)
Creatinine, Ser: 0.79 mg/dL (ref 0.44–1.00)
GFR, Estimated: >60 mL/min (ref >60)
Glucose, Bld: 170 mg/dL — ABNORMAL HIGH (ref 70–99)
Potassium: 3.7 mmol/L (ref 3.5–5.1)
Sodium: 136 mmol/L (ref 135–145)

## 2021-08-20 LAB — MAGNESIUM: Magnesium: 2.1 mg/dL (ref 1.7–2.4)

## 2021-08-20 MED ORDER — AZATHIOPRINE 50 MG PO TABS
125.0000 mg | ORAL_TABLET | Freq: Every day | ORAL | Status: DC
  Administered 2021-08-20 – 2021-08-25 (×6): 125 mg via ORAL
  Filled 2021-08-20 (×6): qty 3

## 2021-08-20 MED ORDER — POLYETHYLENE GLYCOL 3350 17 G PO PACK
17.0000 g | PACK | Freq: Two times a day (BID) | ORAL | Status: DC
  Administered 2021-08-20: 17 g via ORAL
  Filled 2021-08-20: qty 1

## 2021-08-20 MED ORDER — HEPARIN BOLUS VIA INFUSION
2000.0000 [IU] | INTRAVENOUS | Status: DC
  Filled 2021-08-20: qty 2000

## 2021-08-20 MED ORDER — BISACODYL 10 MG RE SUPP
10.0000 mg | Freq: Once | RECTAL | Status: AC
  Administered 2021-08-20: 10 mg via RECTAL
  Filled 2021-08-20: qty 1

## 2021-08-20 MED ORDER — HYDROCORTISONE SOD SUC (PF) 100 MG IJ SOLR
100.0000 mg | Freq: Once | INTRAMUSCULAR | Status: AC
  Administered 2021-08-20: 100 mg via INTRAVENOUS
  Filled 2021-08-20: qty 2

## 2021-08-20 MED ORDER — MORPHINE SULFATE (PF) 2 MG/ML IV SOLN
0.5000 mg | INTRAVENOUS | Status: DC | PRN
  Administered 2021-08-22: 0.5 mg via INTRAVENOUS
  Filled 2021-08-20: qty 1

## 2021-08-20 MED ORDER — HYDROCORTISONE ACETATE 25 MG RE SUPP
25.0000 mg | Freq: Two times a day (BID) | RECTAL | Status: DC
  Administered 2021-08-20 – 2021-08-25 (×9): 25 mg via RECTAL
  Filled 2021-08-20 (×11): qty 1

## 2021-08-20 MED ORDER — PREDNISONE 20 MG PO TABS
20.0000 mg | ORAL_TABLET | Freq: Every day | ORAL | Status: DC
  Administered 2021-08-21 – 2021-08-25 (×5): 20 mg via ORAL
  Filled 2021-08-20 (×5): qty 1

## 2021-08-20 MED ORDER — FLEET ENEMA 7-19 GM/118ML RE ENEM: 1.0000 | ENEMA | Freq: Once | RECTAL | Status: DC

## 2021-08-20 MED ORDER — STERILE WATER FOR INJECTION IJ SOLN
INTRAMUSCULAR | Status: AC
  Filled 2021-08-20: qty 10

## 2021-08-20 MED ORDER — HEPARIN (PORCINE) 25000 UT/250ML-% IV SOLN
800.0000 [IU]/h | INTRAVENOUS | Status: DC
  Administered 2021-08-21: 800 [IU]/h via INTRAVENOUS
  Filled 2021-08-20 (×2): qty 250

## 2021-08-20 MED ORDER — HEPARIN BOLUS VIA INFUSION
2000.0000 [IU] | Freq: Once | INTRAVENOUS | Status: AC
  Administered 2021-08-20: 2000 [IU] via INTRAVENOUS
  Filled 2021-08-20: qty 2000

## 2021-08-20 MED ORDER — POLYETHYLENE GLYCOL 3350 17 G PO PACK
17.0000 g | PACK | Freq: Three times a day (TID) | ORAL | Status: DC
  Administered 2021-08-20 – 2021-08-25 (×12): 17 g via ORAL
  Filled 2021-08-20 (×14): qty 1

## 2021-08-20 MED ORDER — SODIUM CHLORIDE 0.9 % IV BOLUS
500.0000 mL | Freq: Once | INTRAVENOUS | Status: AC
  Administered 2021-08-20: 500 mL via INTRAVENOUS

## 2021-08-20 MED ORDER — ALTEPLASE 2 MG IJ SOLR
2.0000 mg | Freq: Once | INTRAMUSCULAR | Status: AC
  Administered 2021-08-20: 2 mg
  Filled 2021-08-20: qty 2

## 2021-08-20 NOTE — Progress Notes (Signed)
Progress Note  Patient Name: Katherine [name] Date of Encounter: 08/20/2021  [facility] Cardiologist: [name], MD   Subjective   No CP or dyspnea; complains of constipation.   Inpatient Medications    Scheduled Meds:  (feeding supplement) PROSource Plus  30 mL Oral BID BM   alteplase  2 mg Intracatheter Once   arformoterol  15 mcg Nebulization BID   vitamin C  500 mg Oral Daily   aspirin  81 mg Oral Daily   budesonide  2 mL Nebulization BID   calcitRIOL  0.25 mcg Oral Q3 days   Chlorhexidine Gluconate Cloth  6 each Topical Daily   clopidogrel  75 mg Oral Daily   feeding supplement (GLUCERNA SHAKE)  237 mL Oral TID BM   fluticasone  2 spray Each Nare Daily   furosemide  40 mg Oral Daily   guaiFENesin-dextromethorphan  10 mL Oral QID   insulin aspart  0-15 Units Subcutaneous TID WC   insulin aspart  0-5 Units Subcutaneous QHS   insulin aspart  8 Units Subcutaneous TID WC   insulin glargine-yfgn  15 Units Subcutaneous Daily   lamoTRIgine  25 mg Oral QHS   linagliptin  5 mg Oral Daily   loratadine  10 mg Oral Daily   magic mouthwash  5 mL Oral QID   mouth rinse  15 mL Mouth Rinse BID   montelukast  10 mg Oral QHS   multivitamin with minerals  1 tablet Oral Daily   nystatin   Topical TID   pantoprazole  40 mg Oral Daily   [START ON 08/21/2021] predniSONE  10 mg Oral Q breakfast   [START ON 08/23/2021] predniSONE  5 mg Oral Q breakfast   Ensure Max Protein  11 oz Oral Daily   rosuvastatin  20 mg Oral Daily   senna-docusate  2 tablet Oral BID   sertraline  100 mg Oral Daily   sodium chloride flush  10-40 mL Intracatheter Q12H   zinc sulfate  220 mg Oral Daily   Continuous Infusions:  heparin 800 Units/hr (08/20/21 0600)   phenylephrine (NEO-SYNEPHRINE) Adult infusion 50 mcg/min (08/20/21 0600)   PRN Meds: acetaminophen **OR** acetaminophen, cyclobenzaprine, hydrALAZINE, ipratropium-albuterol, LORazepam, menthol-cetylpyridinium, morphine injection,  nitroGLYCERIN, ondansetron (ZOFRAN) IV, oxyCODONE, promethazine, sodium chloride, sodium chloride flush   Vital Signs    Vitals:   08/20/21 0410 08/20/21 0500 08/20/21 0600 08/20/21 0800  BP:  (!) 94/53 (!) 110/57   Pulse:  80 80   Resp:  19 17   Temp:    98.4 F (36.9 C)  TempSrc:    Oral  SpO2:  97% 98%   Weight: 87.3 kg     Height:        Intake/Output Summary (Last 24 hours) at 08/20/2021 1117 Last data filed at 08/20/2021 0913 Gross per 24 hour  Intake 1747.85 ml  Output 2450 ml  Net -702.15 ml   Last 3 Weights 08/20/2021 08/19/2021 08/18/2021  Weight (lbs) 192 lb 7.4 oz 190 lb 4.1 oz 190 lb 0.6 oz  Weight (kg) 87.3 kg 86.3 kg 86.2 kg      Telemetry    Atrial flutter - Personally Reviewed  Physical Exam   GEN: No acute distress.   Neck: No JVD Cardiac: irregular Respiratory: Clear to auscultation bilaterally. GI: Soft, nontender, non-distended  MS: No edema Neuro:  Nonfocal  Psych: Normal affect   Labs    High Sensitivity Troponin:   Recent Labs  Lab 08/11/21 1431 08/12/21 0311  08/14/21 1845 08/19/21 1003 08/19/21 1135  TROPONINIHS 877* 926* 351* 93* 76*     Chemistry Recent Labs  Lab 08/15/21 0440 08/16/21 0600 08/18/21 0508 08/19/21 0430 08/20/21 0417  NA 135   < > 131* 131* 136  K 3.3*   < > 5.0 3.6 3.7  CL 94*   < > 94* 93* 101  CO2 31   < > 30 29 29   GLUCOSE 73   < > 164* 192* 170*  BUN 58*   < > 43* 38* 28*  CREATININE 0.92   < > 0.82 0.90 0.79  CALCIUM 9.4   < > 9.3 9.2 8.8*  MG 2.2  --   --   --  2.1  PROT 6.1*  --   --   --   --   ALBUMIN 2.4*  --   --   --   --   AST 19  --   --   --   --   ALT 16  --   --   --   --   ALKPHOS 138*  --   --   --   --   BILITOT 0.7  --   --   --   --   GFRNONAA >60   < > >60 >60 >60  ANIONGAP 10   < > 7 9 6    < > = values in this interval not displayed.     Hematology Recent Labs  Lab 08/17/21 0550 08/18/21 0508 08/20/21 0417  WBC 9.8 10.6* 10.9*  RBC 4.01 3.87 3.82*  HGB 12.7 12.1 12.2   HCT 38.4 37.1 37.5  MCV 95.8 95.9 98.2  MCH 31.7 31.3 31.9  MCHC 33.1 32.6 32.5  RDW 15.4 15.2 15.2  PLT 249 224 225    Patient Profile     66 y.o. 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 Last cath/PCI was in 02/2021 with complex findings, see EMR for full report. She was admitted 08/04/2021 with acute on chronic hypoxic respiratory failure found to have Covid-19 infection with MRSA pneumonia in setting of COPD. Hospital course notable for LV dysfunction, uncontrolled DM, pressure ulcer of skin POA, hyponatremia, hypokalemia, generalized weakness. Found to have NSTEMI with troponin peak over 4000.  Assessment & Plan    1 non-ST elevation myocardial infarction-patient has had no recurrent chest pain since yesterday.  We will continue aspirin, Plavix and heparin.  Continue statin.  Follow-up echocardiogram showed ejection fraction 45 to 50%.  Plan is to proceed with cardiac catheterization and will likely proceed tomorrow.  The risk and benefits including myocardial infarction, CVA and death discussed and she agrees to proceed.  2 paroxysmal atrial fibrillation/flutter-continue heparin.  Will need to resume apixaban at discharge.  If no PCI necessary following tomorrow's cardiac catheterization we will plan Plavix and apixaban long-term given PCI in July.  3 acute combined systolic/diastolic congestive heart failure-her blood pressure has been borderline and she is not volume overloaded.  We will discontinue  Lasix.  4 acute on chronic respiratory failure-occurring in the setting  of COVID-19 and MRSA pneumonia-patient has been treated with improvement.  5 history of renal transplant-on chronic immunosuppressive therapy.  For questions or updates, please contact [place] Please consult [email or web address] for contact info under        Signed, Kirk Ruths, MD  08/20/2021, 11:17 AM

## 2021-08-20 NOTE — Progress Notes (Signed)
ANTICOAGULATION CONSULT NOTE  Pharmacy Consult for Heparin Indication: chest pain/ACS  Allergies  Allergen Reactions   Tetracycline Hives    Patient tolerated Doxycycline Dec 2020   Niacin Other (See Comments)    Mouth blisters   Niaspan [Niacin Er] Other (See Comments)    Mouth blisters   Sulfa Antibiotics Nausea Only and Other (See Comments)    "Tears up stomach"   Sulfonamide Derivatives Other (See Comments)    Reaction: per patient "tears her stomach up"   Codeine Nausea And Vomiting   Erythromycin Nausea And Vomiting   Hydromorphone Hcl Nausea And Vomiting   Morphine And Related Nausea And Vomiting   Nalbuphine Nausea And Vomiting    Nubain   Sulfasalazine Nausea Only and Other (See Comments)    per patient "tears her stomach up", "Tears up stomach"   Tape Rash and Other (See Comments)    No "plastic" tape," please----cloth tape only    Patient Measurements: Height: 5\' 1"  (154.9 cm) Weight: 87.3 kg (192 lb 7.4 oz) IBW/kg (Calculated) : 47.8 Heparin Dosing Weight: 66kg  Vital Signs: Temp: 100.3 F (37.9 C) (01/04 0338) Temp Source: Oral (01/04 0338) BP: 100/56 (01/04 0400) Pulse Rate: 78 (01/04 0000)  Labs: Recent Labs    08/17/21 0550 08/18/21 0508 08/19/21 0430 08/19/21 1003 08/19/21 1135 08/20/21 0417  HGB 12.7 12.1  --   --   --  12.2  HCT 38.4 37.1  --   --   --  37.5  PLT 249 224  --   --   --  225  HEPARINUNFRC 0.56 0.60 0.45  --   --  0.13*  CREATININE 1.03* 0.82 0.90  --   --  0.79  TROPONINIHS  --   --   --  93* 76*  --       Medications:  Infusions:   heparin 700 Units/hr (08/20/21 0000)   phenylephrine (NEO-SYNEPHRINE) Adult infusion 50 mcg/min (08/20/21 0000)    Assessment: Katherine [name], Covid +, requiring high O2 administration. Complaint of chest pain, Troponin elevated.  PTA Apixaban for Afib transitioned to heparin infusion in the setting of NSTEMI.   - Last dose Apixaban 12/24 at 0900.  Note that Apixaban will artificially elevate  Hep levels, will dose based on aPTT until both levels correlate Troponin: 16, 150, 928, 2835, peak at 4125 >> now downtrending  Heparin level falsely elevated due to recent apixaban.  Today, 08/20/21 - Heparin level now subtherapeutic at 0.13 on 700 units/hr - CBC WNL - no bleeding reported  - Verified with RN that heparin has been infusing without any interruptions - Originally planned for cardiac cath either 12/29 or 12/30; however this was delayed d/t O2 requirements. Will need to remain on heparin per cardiology.  Cath timing TBD.  Goal of Therapy:  Heparin level 0.3-0.7 units/ml Monitor platelets by anticoagulation protocol: Yes   Plan:  Rebolus heparin 2000 units IV x1 then Increase heparin drip to 800 units/hour Recheck heparin level in 6hrs Daily heparin level and CBC F/u cardiac cath plans  F/u ability to transition back to [place] [facility], [facility] main pharmacy [phone] 08/20/2021 5:08 AM

## 2021-08-20 NOTE — Progress Notes (Signed)
PROGRESS NOTE    Katherine [name]  KDX:[id number] DOB: [birth year] DOA: 08/04/2021 PCP: [name], MD    Brief Narrative:  66 year old female with a history of CAD/CABG, chronic hypoxic respiratory failure, COPD, diabetes, kidney transplant, paroxysmal atrial fibrillation, admitted to the hospital with generalized weakness and shortness of breath.  She was found to be hypoxic on her normal oxygen requirement.  Found to have COVID-19/MRSA pneumonia.  Treated with antibiotics, remdesivir and steroids.  She was noted to have NSTEMI.  Seen by cardiology with plans for cardiac cath.  Overall respiratory status has improved and appears to be approaching baseline.  She was noted to be hypotensive on 1/3 and started on Neo-Synephrine infusion.  Suspect that she may have an element of adrenal insufficiency, since her steroids are being tapered and she is chronically on prednisone.  Weaning off vasopressors today.   Assessment & Plan:   Principal Problem:   Acute on chronic respiratory failure with hypoxia (HCC) Active Problems:   Hypomagnesemia   Dyslipidemia, goal LDL below 70   NSTEMI (non-ST elevated myocardial infarction) (HCC)   CAD S/P percutaneous coronary angioplasty - PCI x 5 to SVG-D1   Essential hypertension   PAD (peripheral artery disease) (HCC)   Paroxysmal atrial fibrillation (HCC); CHA2DS2VASc score F, HTN, CAD, CVA = 5   OSA (obstructive sleep apnea)   GAD (generalized anxiety disorder)   Acute CHF (congestive heart failure) ([place])   History of renal transplant   Uncontrolled type 2 diabetes mellitus with hyperglycemia, 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   Pressure injury of skin   COVID-19 virus infection   Obesity (BMI 30-39.9)   Chronic diastolic CHF (congestive heart failure) (HCC)   MRSA pneumonia (HCC)   Positive D dimer   Acute on chronic respiratory failure with hypoxia -Secondary to COVID-19 infection as well as MRSA pneumonia in  the setting of COPD -She is chronically on 3 L of oxygen at home -She had worsening respiratory status in the hospital and at 1 point was requiring heated high flow at 25 L -Currently, she is weaned back down to her baseline requirement of 3 L  MRSA pneumonia -Sputum culture positive for MRSA -Treated with 7 days of doxycycline -Clinically she appears to be improved  COVID-19 pneumonia -Treated with remdesivir and steroids -Clinically appears to be improving  Acute on chronic combined CHF -EF 45-50% -She has been diuresing with IV Lasix -Currently appears to be euvolemic and Lasix has been put on hold  Hypotension -Noted to be hypotensive yesterday, started on Neo-Synephrine infusion -Does not appear to be septic or toxic -Possibly related to some element of renal insufficiency since her steroids are being tapered -We will give a dose of IV hydrocortisone -Resume prednisone taper -Currently on phenylephrine at 10 mcgs, hopeful to discontinue later today  Type 2 diabetes -Continue basal/bolus insulin -Holding metformin and Amaryl -She is on Tradjenta  History of renal transplant 1991 -Chronically on prednisone and Imuran -Imuran was held on admission, but with restart today  NSTEMI -History of CAD status post CABG -Troponin peaked over 4000 -Cardiology following plan on cardiac cath on 1/5  Paroxysmal atrial fibrillation -She was receiving metoprolol, held due to hypotension -She is anticoagulated on her drip.  Can transition back to Eliquis after cardiac cath.  Hyperlipidemia -Continue statin   DVT prophylaxis: SCDs Start: 08/04/21 0623 Heparin infusion  Code Status: Full code Family Communication: Discussed with patient Disposition Plan: Status is: Inpatient  Remains  inpatient appropriate because: Needs further cardiac work-up including cardiac cath       Consultants:  Cardiology Psychiatry  Procedures:    Antimicrobials:  Completed course of  doxycycline   Subjective: She feels uncomfortable since she is been unable to pass her stool.  She feels as though she needs to be disimpacted.  Feels that her breathing is improved and is likely approaching baseline.  Objective: Vitals:   08/20/21 0500 08/20/21 0600 08/20/21 0800 08/20/21 1200  BP: (!) 94/53 (!) 110/57    Pulse: 80 80    Resp: 19 17    Temp:   98.4 F (36.9 C) 98.3 F (36.8 C)  TempSrc:   Oral Oral  SpO2: 97% 98%    Weight:      Height:        Intake/Output Summary (Last 24 hours) at 08/20/2021 1245 Last data filed at 08/20/2021 0913 Gross per 24 hour  Intake 1747.85 ml  Output 1800 ml  Net -52.15 ml   Filed Weights   08/18/21 0500 08/19/21 0432 08/20/21 0410  Weight: 86.2 kg 86.3 kg 87.3 kg    Examination:  General exam: Appears calm and comfortable  Respiratory system: Clear to auscultation. Respiratory effort normal. Cardiovascular system: S1 & S2 heard, RRR. No JVD, murmurs, rubs, gallops or clicks. No pedal edema. Gastrointestinal system: Abdomen is nondistended, soft and nontender. No organomegaly or masses felt. Normal bowel sounds heard. Central nervous system: Alert and oriented. No focal neurological deficits. Extremities: Symmetric 5 x 5 power. Skin: No rashes, lesions or ulcers Psychiatry: Judgement and insight appear normal. Mood & affect appropriate.     Data Reviewed: I have personally reviewed following labs and imaging studies  CBC: Recent Labs  Lab 08/15/21 0440 08/16/21 0431 08/17/21 0550 08/18/21 0508 08/20/21 0417  WBC 12.1* 12.8* 9.8 10.6* 10.9*  HGB 13.2 13.4 12.7 12.1 12.2  HCT 39.9 41.0 38.4 37.1 37.5  MCV 94.8 94.3 95.8 95.9 98.2  PLT 308 315 249 224 932   Basic Metabolic Panel: Recent Labs  Lab 08/15/21 0440 08/16/21 0600 08/17/21 0550 08/18/21 0508 08/19/21 0430 08/20/21 0417  NA 135 132* 129* 131* 131* 136  K 3.3* 4.1 3.4* 5.0 3.6 3.7  CL 94* 94* 95* 94* 93* 101  CO2 31 29 27 30 29 29   GLUCOSE 73  128* 337* 164* 192* 170*  BUN 58* 62* 56* 43* 38* 28*  CREATININE 0.92 0.82 1.03* 0.82 0.90 0.79  CALCIUM 9.4 9.0 8.6* 9.3 9.2 8.8*  MG 2.2  --   --   --   --  2.1  PHOS 4.3  --   --   --   --   --    GFR: Estimated Creatinine Clearance: 71.3 mL/min (by C-G formula based on SCr of 0.79 mg/dL). Liver Function Tests: Recent Labs  Lab 08/15/21 0440  AST 19  ALT 16  ALKPHOS 138*  BILITOT 0.7  PROT 6.1*  ALBUMIN 2.4*   No results for input(s): LIPASE, AMYLASE in the last 168 hours. No results for input(s): AMMONIA in the last 168 hours. Coagulation Profile: No results for input(s): INR, PROTIME in the last 168 hours. Cardiac Enzymes: No results for input(s): CKTOTAL, CKMB, CKMBINDEX, TROPONINI in the last 168 hours. BNP (last 3 results) No results for input(s): PROBNP in the last 8760 hours. HbA1C: No results for input(s): HGBA1C in the last 72 hours. CBG: Recent Labs  Lab 08/19/21 1238 08/19/21 1626 08/19/21 2135 08/20/21 0824 08/20/21 1227  GLUCAP  235* 330* 205* 169* 205*   Lipid Profile: No results for input(s): CHOL, HDL, LDLCALC, TRIG, CHOLHDL, LDLDIRECT in the last 72 hours. Thyroid Function Tests: No results for input(s): TSH, T4TOTAL, FREET4, T3FREE, THYROIDAB in the last 72 hours. Anemia Panel: No results for input(s): VITAMINB12, FOLATE, FERRITIN, TIBC, IRON, RETICCTPCT in the last 72 hours. Sepsis Labs: Recent Labs  Lab 08/14/21 0410  PROCALCITON <0.10    No results found for this or any previous visit (from the past 240 hour(s)).       Radiology Studies: No results found.      Scheduled Meds:  (feeding supplement) PROSource Plus  30 mL Oral BID BM   alteplase  2 mg Intracatheter Once   arformoterol  15 mcg Nebulization BID   vitamin C  500 mg Oral Daily   aspirin  81 mg Oral Daily   azaTHIOprine  125 mg Oral Daily   bisacodyl  10 mg Rectal Once   budesonide  2 mL Nebulization BID   calcitRIOL  0.25 mcg Oral Q3 days   Chlorhexidine  Gluconate Cloth  6 each Topical Daily   clopidogrel  75 mg Oral Daily   feeding supplement (GLUCERNA SHAKE)  237 mL Oral TID BM   fluticasone  2 spray Each Nare Daily   guaiFENesin-dextromethorphan  10 mL Oral QID   hydrocortisone sod succinate (SOLU-CORTEF) inj  100 mg Intravenous Once   insulin aspart  0-15 Units Subcutaneous TID WC   insulin aspart  0-5 Units Subcutaneous QHS   insulin aspart  8 Units Subcutaneous TID WC   insulin glargine-yfgn  15 Units Subcutaneous Daily   lamoTRIgine  25 mg Oral QHS   linagliptin  5 mg Oral Daily   loratadine  10 mg Oral Daily   magic mouthwash  5 mL Oral QID   mouth rinse  15 mL Mouth Rinse BID   montelukast  10 mg Oral QHS   multivitamin with minerals  1 tablet Oral Daily   nystatin   Topical TID   pantoprazole  40 mg Oral Daily   polyethylene glycol  17 g Oral BID   [START ON 08/21/2021] predniSONE  20 mg Oral Q breakfast   Ensure Max Protein  11 oz Oral Daily   rosuvastatin  20 mg Oral Daily   senna-docusate  2 tablet Oral BID   sertraline  100 mg Oral Daily   sodium chloride flush  10-40 mL Intracatheter Q12H   zinc sulfate  220 mg Oral Daily   Continuous Infusions:  heparin 800 Units/hr (08/20/21 0600)   phenylephrine (NEO-SYNEPHRINE) Adult infusion 50 mcg/min (08/20/21 0600)     LOS: 16 days    Critical care procedure note Authorized and performed by: Kathie Dike Total critical care time: Approximately 35 minutes Due to high probability of clinically significant, life-threatening deterioration, the patient required my highest level of preparedness to intervene emergently and I personally spent this critical care time directly and personally managing the patient.  The critical care time included obtaining a history, examining the patient, pulse oximetry, ordering and review of studies, arranging urgent treatment with development of a management plan, evaluation of patient's response to treatment, frequent reassessment, discussions  with other providers.  Critical care time was performed to assess and manage the high probability of imminent, life-threatening deterioration that could result in multiorgan failure.  It was exclusive of separate billable procedures and treating other patients and teaching time.  Please see MDM section and the rest of the of  note for further information on patient assessment and treatment     Kathie Dike, MD [facility]   If 7PM-7AM, please contact night-coverage [email or web address]  08/20/2021, 12:45 PM

## 2021-08-20 NOTE — TOC Initial Note (Signed)
Transition of Care [facility]) - Initial/Assessment Note    Patient Details  Name: Katherine [name] MRN: [id number] Date of Birth: 23-Oct-1955  Transition of Care [facility]) CM/SW Contact:    Dessa Phi, RN Phone Number: 08/20/2021, 10:01 AM  Clinical Narrative: [place] HHPT/OT/family to provide own transport home. Already has  home 02-[facility].                Expected Discharge Plan: [place] Barriers to Discharge: Continued Medical Work up   Patient Goals and CMS Choice Patient states their goals for this hospitalization and ongoing recovery are:: go home CMS Medicare.gov Compare Post Acute Care list provided to:: Patient Represenative (must comment) Choice offered to / list presented to :  ((Niece) Counsellor)  Expected Discharge Plan and Services Expected Discharge Plan: [place]   Discharge Planning Services: CM Consult   Living arrangements for the past 2 months: Single Family Home                           HH Arranged: PT, OT HH Agency: [place] Date [facility] Agency Contacted: 08/20/21 Time HH Agency Contacted: 1000 Representative spoke with at [place]  Prior Living Arrangements/Services Living arrangements for the past 2 months: [place] with:: Self Patient language and need for interpreter reviewed:: Yes Do you feel safe going back to the place where you live?: Yes      Need for Family Participation in Patient Care: No (Comment) Care giver support system in place?: Yes (comment) Current home services: DME (home 02-[facility];rw,cane) Criminal Activity/Legal Involvement Pertinent to Current Situation/Hospitalization: No - Comment as needed  Activities of Daily Living Home Assistive Devices/Equipment: Oxygen ADL Screening (condition at time of admission) Patient's cognitive ability adequate to safely complete daily activities?: Yes Is the patient deaf or have difficulty hearing?:  No Does the patient have difficulty seeing, even when wearing glasses/contacts?: No Does the patient have difficulty concentrating, remembering, or making decisions?: No Patient able to express need for assistance with ADLs?: Yes Does the patient have difficulty dressing or bathing?: No Independently performs ADLs?: Yes (appropriate for developmental age) Does the patient have difficulty walking or climbing stairs?: Yes Weakness of Legs: Both Weakness of Arms/Hands: Both  Permission Sought/Granted Permission sought to share information with : Case Manager Permission granted to share information with : Yes, Verbal Permission Granted  Share Information with NAME: Case Manager           Emotional Assessment Appearance:: Appears stated age Attitude/Demeanor/Rapport: Gracious Affect (typically observed): Accepting Orientation: : Oriented to Self, Oriented to Place, Oriented to  Time, Oriented to Situation Alcohol / Substance Use: Not Applicable Psych Involvement: No (comment)  Admission diagnosis:  Acute on chronic respiratory failure with hypoxia (HCC) [J96.21] COVID-19 [U07.1] Patient Active Problem List   Diagnosis Date Noted   MRSA pneumonia ([place]) 08/09/2021   Positive D dimer 08/09/2021   Chronic diastolic CHF (congestive heart failure) ([place]) 08/06/2021   COVID-19 virus infection 08/05/2021   Obesity (BMI 30-39.9) 08/05/2021   Acute on chronic respiratory failure with hypoxia ([place]) 08/04/2021   Pressure injury of skin 08/04/2021   Polymorphic ventricular tachycardia    Bradycardia    Acute encephalopathy    COPD (chronic obstructive pulmonary disease) (HCC)    AKI (acute kidney injury) ([place])    History of renal transplant    Uncontrolled type 2 diabetes mellitus with hyperglycemia,  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    Nausea & vomiting 04/19/2021   Acute kidney injury ([place]) 04/19/2021   Viral syndrome 03/21/2021   Pure hypercholesterolemia     Coronary stent restenosis due to scar tissue    Unstable angina ([name]) 02/19/2021   Acute CHF (congestive heart failure) ([place]) 01/29/2021   Non-ST elevation (NSTEMI) myocardial infarction [facility])    Chronic respiratory failure with hypoxia ([place]) 01/01/2021   Flu vaccine need 08/13/2020   Need for pneumococcal vaccination 08/13/2020   Visit for screening mammogram 08/13/2020   Candidal skin infection 08/13/2020   GAD (generalized anxiety disorder) 08/13/2020   Chronic bronchitis, mucopurulent ([place]) 08/24/2019   Current chronic use of systemic steroids 07/23/2019   OSA (obstructive sleep apnea)    Severe episode of recurrent major depressive disorder, with psychotic features ([place]) 02/16/2019   Obstructive chronic bronchitis without exacerbation ([place]) 11/03/2018   Long term (current) use of antithrombotics/antiplatelets 04/28/2018   Squamous cell carcinoma of skin of right thumb 02/02/2018   Renal transplant, status post 01/28/2018   LBBB (left bundle branch block) 10/07/2017   Primary osteoarthritis of right knee 08/18/2017   Shortness of breath 06/18/2017   Ovarian cyst 05/18/2017   Pelvic mass in female 03/11/2017   Mild aortic stenosis by prior echocardiogram 01/28/2017   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   Deficiency anemia 10/14/2016   Normocytic anemia 10/13/2016   Fibromyalgia 03/30/2016   Other spondylosis with radiculopathy, lumbar region 03/30/2016   Type 2 diabetes mellitus with peripheral neuropathy ([place]) 03/30/2016   Paroxysmal atrial fibrillation ([place]); CHA2DS2VASc score F, HTN, CAD, CVA = 5 06/28/2014   Fluttering heart 06/21/2014   CAD S/P percutaneous coronary angioplasty - PCI x 5 to SVG-D1 09/11/2013    Class: Diagnosis of   Essential hypertension    NSTEMI (non-ST elevated myocardial infarction) ([place]) 08/23/2013   PAD (peripheral artery disease) ([place]) 08/17/2013   CAD (coronary artery disease) of bypass graft 10/08/2012   Stenosis of right carotid artery without  infarction 10/08/2012   Dyslipidemia, goal LDL below 70 10/08/2012    Class: Diagnosis of   Mitral annular calcification 10/08/2012   Hypomagnesemia 06/13/2012   Renal transplant disorder 06/12/2012   Chronic allergic rhinitis 04/29/2011   Extrinsic asthma 09/09/2007   GERD 09/09/2007   COUGH, CHRONIC 09/09/2007   Morbid obesity - s/p Lap Band 9/'05 05/07/2004    Class: Diagnosis of   Hx of CABG 09/07/1993    Class: History of   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 07/1993   Coronary artery disease involving native coronary artery of native heart with angina pectoris ([place]) 07/1993   ESRD (end stage renal disease) ([place]) 1991   PCP:  [name], MD Pharmacy:   [id number] - [place], [address] [place] [address] Phone: [phone] Fax: [phone]     Social Determinants of Health (SDOH) Interventions    Readmission Risk Interventions Readmission Risk Prevention Plan 05/14/2021 02/21/2021 01/31/2021  Transportation Screening Complete Complete Complete  HRI or Home Care Consult - - Complete  Social Work Consult for Recovery Care Planning/Counseling - - Complete  Palliative Care Screening - - Not Applicable  Medication Review Press photographer) Complete Complete Complete  PCP or Specialist appointment within 3-5 days of discharge Complete Complete -  HRI or Home Care Consult Complete Complete -  SW Recovery Care/Counseling Consult Complete Complete -  Palliative Care Screening Not Applicable Not Applicable -  [place] Not Applicable Not Applicable -  Some recent data might be hidden

## 2021-08-20 NOTE — Progress Notes (Signed)
Nutrition Follow-up  DOCUMENTATION CODES:   Obesity unspecified  INTERVENTION:  - continue Glucerna Shake TID and 30 ml Prosource Plus BID.   NUTRITION DIAGNOSIS:   Increased nutrient needs related to acute illness, catabolic illness (LPFXT-02 infection) as evidenced by estimated needs. -ongoing  GOAL:   Patient will meet greater than or equal to 90% of their needs -unmet on average  MONITOR:   PO intake, Supplement acceptance, Labs, Weight trends   ASSESSMENT:   66 y.o. 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 HTN, OSA, PAD, and afib. She presented to the ED with mild confusion, progressively worsening weakness, worsening respiratory failure with hypoxia (84% sats requiring 6L O2) low-grade fever, chills, fatigue, malaise, headache, sore throat, rhinorrhea, dry cough, and several episodes of loose stools for several days.  Patient is now off of COVID precautions. Was eating 0-25% from 12/27-12/30 other than 75% of breakfast on 12/30. No meal completion percentages documented since 12/30.  She has been accepting oral nutrition supplements 100% of the time offered, per review of orders.   She had not had a BM for 4-5 days until having a small BM earlier this AM.   Patient discussed in rounds this AM. At the time of visit patient was laying on her side hanging onto side rail. No visitors present at the time of RD visit.  She shares that she is feeling generally unwell and that she has been constipated which has led to lack of interest in eating or drinking. She did not have anything for breakfast this AM.  She reports she had been consuming protein shakes and doing well  with those but now she does not want them and even lifting a bite of food to her mouth is undesirable.   Patient expresses desire to be left to rest as she was feeling very tired.   Weight has been stable since 12/27. Non-pitting edema to all extremities documented in the edema section of flow sheet.    Labs reviewed; CBG: 169 mg/dl, BUN: 28 mg/dl.  Medications reviewed; 500 mg ascorbic acid/day, sliding scale novolog, 8 units novolog TID, 15 units semglee/day, 5 mg tradjenta/day, 1 tablet multivitamin with minerals/day, 40 mg oral protonix/day, deltasone taper, 2 tablets senokot BID started 12/28, 220 mg zinc sulfate/day.     NUTRITION - FOCUSED PHYSICAL EXAM:  Flowsheet Row Most Recent Value  Orbital Region No depletion  Upper Arm Region No depletion  Thoracic and Lumbar Region Unable to assess  Buccal Region No depletion  Temple Region No depletion  Clavicle Bone Region No depletion  Clavicle and Acromion Bone Region No depletion  Scapular Bone Region No depletion  Dorsal Hand No depletion  Patellar Region Unable to assess  Anterior Thigh Region Unable to assess  Posterior Calf Region Unable to assess  Edema (RD Assessment) Unable to assess  Hair Reviewed  Eyes Reviewed  Mouth Reviewed  Skin Reviewed  Nails Unable to assess  [nail polish]       Diet Order:   Diet Order             Diet heart healthy/carb modified Room service appropriate? Yes; Fluid consistency: Thin  Diet effective now  EDUCATION NEEDS:   Not appropriate for education at this time  Skin:  Skin Assessment: Skin Integrity Issues: Skin Integrity Issues:: Stage II Stage II: rectum  Last BM:  1/4 (type 1, small amount)  Height:   Ht Readings from Last 1 Encounters:  08/05/21 5\' 1"  (1.549 m)    Weight:   Wt Readings from Last 1 Encounters:  08/20/21 87.3 kg     Estimated Nutritional Needs:  Kcal:  1800-2000 kcal Protein:  90-105 grams Fluid:  >/= 2  L/day     Jarome Matin, MS, RD, LDN Inpatient Clinical Dietitian RD pager # available in [place]  After hours/weekend pager # available in [facility]

## 2021-08-20 NOTE — Progress Notes (Signed)
ANTICOAGULATION CONSULT NOTE  Pharmacy Consult for Heparin Indication: chest pain/ACS  Allergies  Allergen Reactions   Tetracycline Hives    Patient tolerated Doxycycline Dec 2020   Niacin Other (See Comments)    Mouth blisters   Niaspan [Niacin Er] Other (See Comments)    Mouth blisters   Sulfa Antibiotics Nausea Only and Other (See Comments)    "Tears up stomach"   Sulfonamide Derivatives Other (See Comments)    Reaction: per patient "tears her stomach up"   Codeine Nausea And Vomiting   Erythromycin Nausea And Vomiting   Hydromorphone Hcl Nausea And Vomiting   Morphine And Related Nausea And Vomiting   Nalbuphine Nausea And Vomiting    Nubain   Sulfasalazine Nausea Only and Other (See Comments)    per patient "tears her stomach up", "Tears up stomach"   Tape Rash and Other (See Comments)    No "plastic" tape," please----cloth tape only    Patient Measurements: Height: 5\' 1"  (154.9 cm) Weight: 87.3 kg (192 lb 7.4 oz) IBW/kg (Calculated) : 47.8 Heparin Dosing Weight: 66kg  Vital Signs: Temp: 98.4 F (36.9 C) (01/04 0800) Temp Source: Oral (01/04 0800) BP: 110/57 (01/04 0600) Pulse Rate: 80 (01/04 0600)  Labs: Recent Labs    08/18/21 0508 08/19/21 0430 08/19/21 1003 08/19/21 1135 08/20/21 0417  HGB 12.1  --   --   --  12.2  HCT 37.1  --   --   --  37.5  PLT 224  --   --   --  225  HEPARINUNFRC 0.60 0.45  --   --  0.13*  CREATININE 0.82 0.90  --   --  0.79  TROPONINIHS  --   --  93* 76*  --       Medications:  Infusions:   heparin 800 Units/hr (08/20/21 0600)   phenylephrine (NEO-SYNEPHRINE) Adult infusion 50 mcg/min (08/20/21 0600)    Assessment: 65 yoF, Covid +, requiring high O2 administration. Complaint of chest pain, Troponin elevated.  PTA Apixaban for Afib transitioned to heparin infusion in the setting of NSTEMI.   - Last dose Apixaban 12/24 at 0900.  Note that Apixaban will artificially elevate Hep levels, will dose based on aPTT until both  levels correlate Troponin: 16, 150, 928, 2835, peak at 4125 >> now downtrending  Heparin level falsely elevated due to recent apixaban.  Today, 08/20/21 - Heparin level 0.68, therapeutic on heparin 800 units/hr - RN double checked lines and infusion.  Good blood return, no IV interruptions or pauses noted.   - CBC WNL - No bleeding reported  - Planning for cardiac cath 1/5.  Remain on heparin per cardiology.  Goal of Therapy:  Heparin level 0.3-0.7 units/ml Monitor platelets by anticoagulation protocol: Yes   Plan:  Continue heparin IV infusion at 800 units/hr  Recheck confirmatory heparin level in 6hrs Daily heparin level and CBC F/u cardiac cath plans for 1/5 at 10:30 F/u ability to transition back to [place] PharmD, [place] Pharmacist [facility] main pharmacy [phone] 08/20/2021 12:13 PM

## 2021-08-20 NOTE — Progress Notes (Signed)
ANTICOAGULATION CONSULT NOTE  Pharmacy Consult for Heparin Indication: chest pain/ACS  Allergies  Allergen Reactions   Tetracycline Hives    Patient tolerated Doxycycline Dec 2020   Niacin Other (See Comments)    Mouth blisters   Niaspan [Niacin Er] Other (See Comments)    Mouth blisters   Sulfa Antibiotics Nausea Only and Other (See Comments)    "Tears up stomach"   Sulfonamide Derivatives Other (See Comments)    Reaction: per patient "tears her stomach up"   Codeine Nausea And Vomiting   Erythromycin Nausea And Vomiting   Hydromorphone Hcl Nausea And Vomiting   Morphine And Related Nausea And Vomiting   Nalbuphine Nausea And Vomiting    Nubain   Sulfasalazine Nausea Only and Other (See Comments)    per patient "tears her stomach up", "Tears up stomach"   Tape Rash and Other (See Comments)    No "plastic" tape," please----cloth tape only    Patient Measurements: Height: 5\' 1"  (154.9 cm) Weight: 87.3 kg (192 lb 7.4 oz) IBW/kg (Calculated) : 47.8 Heparin Dosing Weight: 66kg  Vital Signs: Temp: 96.8 F (36 C) (01/04 2000) Temp Source: Axillary (01/04 2000) BP: 120/55 (01/04 2200) Pulse Rate: 96 (01/04 2200)  Labs: Recent Labs    08/18/21 0508 08/19/21 0430 08/19/21 1003 08/19/21 1135 08/20/21 0417 08/20/21 1345 08/20/21 2130  HGB 12.1  --   --   --  12.2  --   --   HCT 37.1  --   --   --  37.5  --   --   PLT 224  --   --   --  225  --   --   HEPARINUNFRC 0.60 0.45  --   --  0.13* 0.68 0.51  CREATININE 0.82 0.90  --   --  0.79  --   --   TROPONINIHS  --   --  93* 76*  --   --   --       Medications:  Infusions:   heparin 800 Units/hr (08/20/21 1330)   phenylephrine (NEO-SYNEPHRINE) Adult infusion Stopped (08/20/21 1327)    Assessment: 65 yoF, Covid +, requiring high O2 administration. Complaint of chest pain, Troponin elevated.  PTA Apixaban for Afib transitioned to heparin infusion in the setting of NSTEMI.   - Last dose Apixaban 12/24 at 0900.   Note that Apixaban will artificially elevate Hep levels, will dose based on aPTT until both levels correlate Troponin: 16, 150, 928, 2835, peak at 4125 >> now downtrending  Heparin level falsely elevated due to recent apixaban.  Today, 08/20/21 - Heparin level 0.51, therapeutic on heparin 800 units/hr - RN double checked lines and infusion.  Good blood return, no IV interruptions or pauses noted.   - CBC WNL - No bleeding reported  - Planning for cardiac cath 1/5.  Remain on heparin per cardiology.  Goal of Therapy:  Heparin level 0.3-0.7 units/ml Monitor platelets by anticoagulation protocol: Yes   Plan:  Continue heparin IV infusion at 800 units/hr  Daily heparin level and CBC F/u cardiac cath plans for 1/5 at 10:30 F/u ability to transition back to [place] PharmD, BCPS Clinical Pharmacist [facility] main pharmacy [phone] 08/20/2021 11:07 PM

## 2021-08-20 NOTE — Progress Notes (Addendum)
Physical Therapy Treatment Patient Details Name: Katherine [name] MRN: [id number] DOB: 1955/10/20 Today's Date: 08/20/2021   History of Present Illness Pt admitted from home 2* SOB with COVID PNA and with hx of CAD, CABG, PAD, PAF, COPD, Fibromyalgia, R eye CVA with residual blindness, kidney transplant and obesity    PT Comments    PT heard patient yelling out. Patient reports stool burden painful, unable to use bedpan. PT assisted patient to [facility] with some success. Patient  began to report  feeling lightheaded. RN in to assist with transfer back to bed.  Patient   on amio drip per RN. BP 107/48, SPO2 on 2 L >88%. HR in 90's. Continue PT as medically able to progress.  Recommendations for follow up therapy are one component of a multi-disciplinary discharge planning process, led by the attending physician.  Recommendations may be updated based on patient status, additional functional criteria and insurance authorization.  Follow Up Recommendations  Skilled nursing-short term rehab (<3 hours/day)     Assistance Recommended at Discharge Frequent or constant Supervision/Assistance  Patient can return home with the following Two people to help with walking and/or transfers;A lot of help with bathing/dressing/bathroom;A lot of help with walking and/or transfers   Equipment Recommendations  None recommended by PT    Recommendations for Other Services       Precautions / Restrictions Precautions Precautions: Fall Precaution Comments: monitor sats/HR  /BP back on amio     Mobility  Bed Mobility Overal bed mobility: Needs Assistance Bed Mobility: Rolling;Sidelying to Sit Rolling: Min assist Sidelying to sit: Min assist Supine to sit: HOB elevated;Mod assist;+2 for safety/equipment     General bed mobility comments: Patient required increase support to return to bed due to fatigue    Transfers Overall transfer level: Needs assistance Equipment used: 1 person hand held  assist Transfers: Sit to/from Stand;Bed to chair/wheelchair/BSC Sit to Stand: From elevated surface;Min assist     Step pivot transfers: Min assist;Mod assist     General transfer comment: min assist to [facility],  then mod back to  bed after being on [facility] and reporting legs feel numb and feel fainty. RN into room  to assist.    Ambulation/Gait                   Stairs             Wheelchair Mobility    Modified Rankin (Stroke Patients Only)       Balance Overall balance assessment: Needs assistance Sitting-balance support: No upper extremity supported Sitting balance-Leahy Scale: Fair     Standing balance support: Bilateral upper extremity supported;Reliant on assistive device for balance;During functional activity Standing balance-Leahy Scale: Poor                              Cognition Arousal/Alertness: Awake/alert Behavior During Therapy: Anxious Overall Cognitive Status: Within Functional Limits for tasks assessed                                          Exercises      General Comments        Pertinent Vitals/Pain Faces Pain Scale: Hurts worst Pain Location: rectum with stool burden Pain Descriptors / Indicators: Grimacing;Crying Pain Intervention(s): Monitored during session    Home Living  Prior Function            PT Goals (current goals can now be found in the care plan section) Progress towards PT goals: Progressing toward goals    Frequency    Min 2X/week      PT Plan Current plan remains appropriate;Frequency needs to be updated;Discharge plan needs to be updated    Co-evaluation              AM-PAC PT "6 Clicks" Mobility   Outcome Measure  Help needed turning from your back to your side while in a flat bed without using bedrails?: A Lot Help needed moving from lying on your back to sitting on the side of a flat bed without using bedrails?: A  Lot Help needed moving to and from a bed to a chair (including a wheelchair)?: A Lot Help needed standing up from a chair using your arms (e.g., wheelchair or bedside chair)?: Total Help needed to walk in hospital room?: Total Help needed climbing 3-5 steps with a railing? : Total 6 Click Score: 9    End of Session Equipment Utilized During Treatment: Gait belt;Oxygen Activity Tolerance: Patient limited by fatigue;Patient limited by pain Patient left: in bed;with call bell/phone within reach;with nursing/sitter in room Nurse Communication: Mobility status PT Visit Diagnosis: Difficulty in walking, not elsewhere classified (R26.2)     Time: 1030-1050 PT Time Calculation (min) (ACUTE ONLY): 20 min  Charges:  $Therapeutic Activity: 8-22 mins                     [name] PT Acute Rehabilitation Services Pager [phone] Office [phone]    Katherine [name] 08/20/2021, 4:01 PM

## 2021-08-20 NOTE — TOC Progression Note (Signed)
Transition of Care [facility]) - Progression Note    Patient Details  Name: Katherine [name] MRN: [id number] Date of Birth: 07/03/1956  [facility]) CM/SW Contact  Shiniqua Groseclose, Juliann Pulse, RN Phone Number: 08/20/2021, 1:08 PM  Clinical Narrative: Damaris Schooner to [facility] rep Arn Medal provide HHPT/OT/      Expected Discharge Plan: [place] Barriers to Discharge: Continued Medical Work up  Expected Discharge Plan and Services Expected Discharge Plan: [place]   Discharge Planning Services: CM Consult   Living arrangements for the past 2 months: Single Family Home                           HH Arranged: PT, OT [facility] Agency: [place] ([facility]) Date HH Agency Contacted: 08/20/21 Time [place]: [id number] Representative spoke with at [place]:  Ramond Marrow)   Social Determinants of Health ([place]) Interventions    Readmission Risk Interventions Readmission Risk Prevention Plan 05/14/2021 02/21/2021 01/31/2021  Transportation Screening Complete Complete Complete  HRI or [place] - - Complete  Social Work Consult for [place] Planning/Counseling - - Complete  Palliative Care Screening - - Not Applicable  Medication Review Press photographer) Complete Complete Complete  PCP or Specialist appointment within 3-5 days of discharge Complete Complete -  [place] or Home Care Consult Complete Complete -  SW Recovery Care/Counseling Consult Complete Complete -  Palliative Care Screening Not Applicable Not Applicable -  [place] Not Applicable Not Applicable -  Some recent data might be hidden

## 2021-08-20 NOTE — Progress Notes (Signed)
OT Cancellation Note  Patient Details Name: Katherine [name] MRN: [id number] DOB: 05-May-1956   Cancelled Treatment:    Reason Eval/Treat Not Completed: Patient not medically ready Patient is on amnio at this time with patient pending cardiac cath on 08/21/20. OT to continue to follow and check back as able.  Jackelyn Poling OTR/L, [place] Acute Rehabilitation Department Office# [phone] Pager# [phone]   08/20/2021, 4:10 PM

## 2021-08-20 NOTE — H&P (Signed)
A/C combined systolic HF NSTEMI in setting AF Acute Covid 19 infection with MRSA PNA Renal transplant with Cr. < 1.0

## 2021-08-20 NOTE — Progress Notes (Signed)
Pt is scheduled for heart catheterization at [facility] tomorrow at 10:30 with Dr. Tamala Julian. NPO at [place]. Orders placed.

## 2021-08-21 ENCOUNTER — Encounter (HOSPITAL_COMMUNITY)
Admission: EM | Disposition: A | Discharge status: Rehabilitation Facility | Payer: Self-pay | Source: Home / Self Care | Attending: Internal Medicine

## 2021-08-21 DIAGNOSIS — E785 Hyperlipidemia, unspecified: Secondary | ICD-10-CM

## 2021-08-21 DIAGNOSIS — I509 Heart failure, unspecified: Secondary | ICD-10-CM | POA: Diagnosis not present

## 2021-08-21 DIAGNOSIS — I5041 Acute combined systolic (congestive) and diastolic (congestive) heart failure: Secondary | ICD-10-CM

## 2021-08-21 DIAGNOSIS — J15212 Pneumonia due to Methicillin resistant Staphylococcus aureus: Secondary | ICD-10-CM

## 2021-08-21 DIAGNOSIS — I2510 Atherosclerotic heart disease of native coronary artery without angina pectoris: Secondary | ICD-10-CM | POA: Diagnosis not present

## 2021-08-21 DIAGNOSIS — U071 COVID-19: Secondary | ICD-10-CM | POA: Diagnosis not present

## 2021-08-21 DIAGNOSIS — I480 Paroxysmal atrial fibrillation: Secondary | ICD-10-CM

## 2021-08-21 DIAGNOSIS — I214 Non-ST elevation (NSTEMI) myocardial infarction: Secondary | ICD-10-CM | POA: Diagnosis not present

## 2021-08-21 DIAGNOSIS — J9621 Acute and chronic respiratory failure with hypoxia: Secondary | ICD-10-CM | POA: Diagnosis not present

## 2021-08-21 DIAGNOSIS — I739 Peripheral vascular disease, unspecified: Secondary | ICD-10-CM

## 2021-08-21 DIAGNOSIS — Z940 Kidney transplant status: Secondary | ICD-10-CM

## 2021-08-21 HISTORY — PX: LEFT HEART CATH AND CORS/GRAFTS ANGIOGRAPHY: CATH118250

## 2021-08-21 LAB — BASIC METABOLIC PANEL
Anion gap: 8 (ref 5–15)
BUN: 24 mg/dL — ABNORMAL HIGH (ref 8–23)
CO2: 30 mmol/L (ref 22–32)
Calcium: 8.8 mg/dL — ABNORMAL LOW (ref 8.9–10.3)
Chloride: 95 mmol/L — ABNORMAL LOW (ref 98–111)
Creatinine, Ser: 0.81 mg/dL (ref 0.44–1.00)
GFR, Estimated: >60 mL/min (ref >60)
Glucose, Bld: 273 mg/dL — ABNORMAL HIGH (ref 70–99)
Potassium: 4.1 mmol/L (ref 3.5–5.1)
Sodium: 133 mmol/L — ABNORMAL LOW (ref 135–145)

## 2021-08-21 LAB — CBC
HCT: 33.9 % — ABNORMAL LOW (ref 36.0–46.0)
HCT: 37.3 % (ref 36.0–46.0)
Hemoglobin: 10.9 g/dL — ABNORMAL LOW (ref 12.0–15.0)
Hemoglobin: 12.2 g/dL (ref 12.0–15.0)
MCH: 31.3 pg (ref 26.0–34.0)
MCH: 31.9 pg (ref 26.0–34.0)
MCHC: 32.2 g/dL (ref 30.0–36.0)
MCHC: 32.7 g/dL (ref 30.0–36.0)
MCV: 97.4 fL (ref 80.0–100.0)
MCV: 97.6 fL (ref 80.0–100.0)
Platelets: 163 10*3/uL (ref 150–400)
Platelets: 167 10*3/uL (ref 150–400)
RBC: 3.48 MIL/uL — ABNORMAL LOW (ref 3.87–5.11)
RBC: 3.82 MIL/uL — ABNORMAL LOW (ref 3.87–5.11)
RDW: 15.6 % — ABNORMAL HIGH (ref 11.5–15.5)
RDW: 15.6 % — ABNORMAL HIGH (ref 11.5–15.5)
WBC: 8.2 10*3/uL (ref 4.0–10.5)
WBC: 8.7 10*3/uL (ref 4.0–10.5)
nRBC: 0 % (ref 0.0–0.2)
nRBC: 0 % (ref 0.0–0.2)

## 2021-08-21 LAB — GLUCOSE, CAPILLARY
Glucose-Capillary: 228 mg/dL — ABNORMAL HIGH (ref 70–99)
Glucose-Capillary: 87 mg/dL (ref 70–99)
Glucose-Capillary: 218 mg/dL — ABNORMAL HIGH (ref 70–99)
Glucose-Capillary: 191 mg/dL — ABNORMAL HIGH (ref 70–99)
Glucose-Capillary: 210 mg/dL — ABNORMAL HIGH (ref 70–99)

## 2021-08-21 LAB — CREATININE, SERUM
Creatinine, Ser: 0.79 mg/dL (ref 0.44–1.00)
GFR, Estimated: >60 mL/min (ref >60)

## 2021-08-21 LAB — HEPARIN LEVEL (UNFRACTIONATED): Heparin Unfractionated: 0.49 IU/mL (ref 0.30–0.70)

## 2021-08-21 LAB — MAGNESIUM: Magnesium: 2.2 mg/dL (ref 1.7–2.4)

## 2021-08-21 SURGERY — LEFT HEART CATH AND CORS/GRAFTS ANGIOGRAPHY
Anesthesia: LOCAL

## 2021-08-21 MED ORDER — LABETALOL HCL 5 MG/ML IV SOLN: 10.0000 mg | INTRAVENOUS | Status: AC | PRN

## 2021-08-21 MED ORDER — SODIUM CHLORIDE 0.9 % IV SOLN: INTRAVENOUS | Status: DC

## 2021-08-21 MED ORDER — SODIUM CHLORIDE 0.9 % IV SOLN: INTRAVENOUS | Status: AC

## 2021-08-21 MED ORDER — ASPIRIN 81 MG PO CHEW
81.0000 mg | CHEWABLE_TABLET | ORAL | Status: AC
  Administered 2021-08-21: 81 mg via ORAL
  Filled 2021-08-21: qty 1

## 2021-08-21 MED ORDER — SODIUM CHLORIDE 0.9% FLUSH: 3.0000 mL | INTRAVENOUS | Status: DC | PRN

## 2021-08-21 MED ORDER — LIDOCAINE HCL (PF) 1 % IJ SOLN
INTRAMUSCULAR | Status: AC
  Filled 2021-08-21: qty 30

## 2021-08-21 MED ORDER — SODIUM CHLORIDE 0.9 % IV SOLN: 250.0000 mL | INTRAVENOUS | Status: DC | PRN

## 2021-08-21 MED ORDER — ACETAMINOPHEN 325 MG PO TABS: 650.0000 mg | ORAL_TABLET | ORAL | Status: DC | PRN

## 2021-08-21 MED ORDER — ASPIRIN 81 MG PO CHEW: 81.0000 mg | CHEWABLE_TABLET | ORAL | Status: DC

## 2021-08-21 MED ORDER — VERAPAMIL HCL 2.5 MG/ML IV SOLN
INTRAVENOUS | Status: AC
  Filled 2021-08-21: qty 2

## 2021-08-21 MED ORDER — MIDAZOLAM HCL 2 MG/2ML IJ SOLN
INTRAMUSCULAR | Status: DC | PRN
  Administered 2021-08-21: .5 mg via INTRAVENOUS

## 2021-08-21 MED ORDER — FENTANYL CITRATE (PF) 100 MCG/2ML IJ SOLN
INTRAMUSCULAR | Status: AC
  Filled 2021-08-21: qty 2

## 2021-08-21 MED ORDER — IOHEXOL 350 MG/ML SOLN
INTRAVENOUS | Status: DC | PRN
  Administered 2021-08-21: 10 mL

## 2021-08-21 MED ORDER — HEPARIN (PORCINE) IN NACL 1000-0.9 UT/500ML-% IV SOLN
INTRAVENOUS | Status: DC | PRN
  Administered 2021-08-21 (×2): 500 mL

## 2021-08-21 MED ORDER — SODIUM CHLORIDE 0.9% FLUSH
3.0000 mL | Freq: Two times a day (BID) | INTRAVENOUS | Status: DC
  Administered 2021-08-21: 3 mL via INTRAVENOUS

## 2021-08-21 MED ORDER — HEPARIN SODIUM (PORCINE) 5000 UNIT/ML IJ SOLN
5000.0000 [IU] | Freq: Three times a day (TID) | INTRAMUSCULAR | Status: DC
  Administered 2021-08-21 – 2021-08-22 (×2): 5000 [IU] via SUBCUTANEOUS
  Filled 2021-08-21 (×2): qty 1

## 2021-08-21 MED ORDER — ONDANSETRON HCL 4 MG/2ML IJ SOLN
4.0000 mg | Freq: Four times a day (QID) | INTRAMUSCULAR | Status: DC | PRN
  Administered 2021-08-21 – 2021-08-23 (×3): 4 mg via INTRAVENOUS
  Filled 2021-08-21 (×3): qty 2

## 2021-08-21 MED ORDER — FENTANYL CITRATE (PF) 100 MCG/2ML IJ SOLN
INTRAMUSCULAR | Status: DC | PRN
  Administered 2021-08-21 (×2): 25 ug via INTRAVENOUS

## 2021-08-21 MED ORDER — HEPARIN SODIUM (PORCINE) 1000 UNIT/ML IJ SOLN
INTRAMUSCULAR | Status: AC
  Filled 2021-08-21: qty 10

## 2021-08-21 MED ORDER — MIDAZOLAM HCL 2 MG/2ML IJ SOLN
INTRAMUSCULAR | Status: AC
  Filled 2021-08-21: qty 2

## 2021-08-21 MED ORDER — LIDOCAINE HCL (PF) 1 % IJ SOLN
INTRAMUSCULAR | Status: DC | PRN
  Administered 2021-08-21: 20 mL via INTRADERMAL

## 2021-08-21 MED ORDER — HYDRALAZINE HCL 20 MG/ML IJ SOLN: 10.0000 mg | INTRAMUSCULAR | Status: AC | PRN

## 2021-08-21 MED ORDER — ASPIRIN 81 MG PO CHEW
81.0000 mg | CHEWABLE_TABLET | Freq: Every day | ORAL | Status: DC
  Filled 2021-08-21: qty 1

## 2021-08-21 MED ORDER — HEPARIN (PORCINE) IN NACL 1000-0.9 UT/500ML-% IV SOLN
INTRAVENOUS | Status: AC
  Filled 2021-08-21: qty 1000

## 2021-08-21 MED ORDER — SODIUM CHLORIDE 0.9% FLUSH
3.0000 mL | Freq: Two times a day (BID) | INTRAVENOUS | Status: DC
  Administered 2021-08-22 – 2021-08-25 (×6): 3 mL via INTRAVENOUS

## 2021-08-21 SURGICAL SUPPLY — 12 items
CATH INFINITI 5FR MULTPACK ANG (CATHETERS) ×1 IMPLANT
GLIDESHEATH SLEND A-KIT 6F 22G (SHEATH) IMPLANT
GLIDEWIRE ANGLED SS 035X260CM (WIRE) ×1 IMPLANT
GUIDEWIRE INQWIRE 1.5J.035X260 (WIRE) IMPLANT
INQWIRE 1.5J .035X260CM (WIRE)
KIT HEART LEFT (KITS) ×3 IMPLANT
PACK CARDIAC CATHETERIZATION (CUSTOM PROCEDURE TRAY) ×3 IMPLANT
SHEATH PINNACLE 5F 10CM (SHEATH) ×1 IMPLANT
TRANSDUCER W/STOPCOCK (MISCELLANEOUS) ×3 IMPLANT
TUBING CIL FLEX 10 FLL-RA (TUBING) ×3 IMPLANT
WIRE EMERALD 3MM-J .035X150CM (WIRE) ×2 IMPLANT
WIRE HI TORQ VERSACORE-J 145CM (WIRE) ×1 IMPLANT

## 2021-08-21 NOTE — Progress Notes (Signed)
PROGRESS NOTE    Katherine [name]  FTD:[id number] DOB: 07/10/1956 DOA: 08/04/2021 PCP: [name], MD    Brief Narrative:  66 year old female with a history of CAD/CABG, chronic hypoxic respiratory failure, COPD, diabetes, kidney transplant, paroxysmal atrial fibrillation, admitted to the hospital with generalized weakness and shortness of breath.  She was found to be hypoxic on her normal oxygen requirement.  Found to have COVID-19/MRSA pneumonia.  Treated with antibiotics, remdesivir and steroids.  She was noted to have NSTEMI.  Seen by cardiology with plans for cardiac cath.  Overall respiratory status has improved and appears to be approaching baseline.  She was noted to be hypotensive on 1/3 and started on Neo-Synephrine infusion.  Suspect that she may have an element of adrenal insufficiency, since her steroids are being tapered and she is chronically on prednisone.  Weaning off vasopressors today.   Assessment & Plan:   Principal Problem:   Acute on chronic respiratory failure with hypoxia (HCC) Active Problems:   Hypomagnesemia   Dyslipidemia, goal LDL below 70   NSTEMI (non-ST elevated myocardial infarction) (HCC)   CAD S/P percutaneous coronary angioplasty - PCI x 5 to SVG-D1   Essential hypertension   PAD (peripheral artery disease) (HCC)   Paroxysmal atrial fibrillation (HCC); CHA2DS2VASc score F, HTN, CAD, CVA = 5   OSA (obstructive sleep apnea)   GAD (generalized anxiety disorder)   Acute CHF (congestive heart failure) (HCC)   Unstable angina (HCC)   History of renal transplant   Uncontrolled type 2 diabetes mellitus with hyperglycemia, 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   Pressure injury of skin   COVID-19 virus infection   Obesity (BMI 30-39.9)   Chronic diastolic CHF (congestive heart failure) (HCC)   MRSA pneumonia (HCC)   Positive D dimer   Acute on chronic respiratory failure with hypoxia -Secondary to COVID-19 infection as  well as MRSA pneumonia in the setting of COPD -She is chronically on 3 L of oxygen at home -She had worsening respiratory status in the hospital and at one point was requiring heated high flow at 25 L -Currently, she is weaned back down to her baseline requirement of 3 L and feels as though her respiratory status is approaching baseline  MRSA pneumonia -Sputum culture positive for MRSA -Treated with 7 days of doxycycline -Clinically she appears to be improved  COVID-19 pneumonia -Treated with remdesivir and steroids -Clinically appears to be improving  Acute on chronic combined CHF -EF 45-50% -She had been diuresing with IV Lasix -Currently appears to be euvolemic and Lasix has been put on hold  Hypotension -Noted to be hypotensive on 1/3, started on Neo-Synephrine infusion -Does not appear to be septic or toxic -Possibly related to some element of adrenal insufficiency since her steroids are being tapered -She received a dose of IV hydrocortisone and her prednisone dose was adjusted -Phenylephrine has since been weaned off, blood pressure remained stable  Type 2 diabetes -Continue basal/bolus insulin -Holding metformin and Amaryl -She is on Tradjenta  History of renal transplant 1991 -Chronically on prednisone and Imuran -Imuran was held on admission, but with restart today  NSTEMI -History of CAD status post CABG -Troponin peaked over 4000 -Cardiology following plan on cardiac cath on 1/5  Paroxysmal atrial fibrillation -She was receiving metoprolol, held due to hypotension -She is anticoagulated on heparin drip.  Can transition back to Eliquis after cardiac cath. -If blood pressure remains stable, can consider resuming low-dose beta-blockers  Hyperlipidemia -Continue statin  Constipation -Manually disimpacted -On MiraLAX  Generalized weakness -Suspect that she may need skilled nursing facility placement  DVT prophylaxis: heparin injection 5,000 Units Start:  08/21/21 2200 SCDs Start: 08/04/21 0623 Heparin infusion  Code Status: Full code Family Communication: Discussed with patient Disposition Plan: Status is: Inpatient  Remains inpatient appropriate because: Needs further cardiac work-up including cardiac cath       Consultants:  Cardiology Psychiatry  Procedures:    Antimicrobials:  Completed course of doxycycline   Subjective: She feels better after disimpaction yesterday.  Reports that her bottom is "raw".  Denies any chest pain or shortness of breath.  Objective: Vitals:   08/21/21 1610 08/21/21 1640 08/21/21 1710 08/21/21 1740  BP: (!) 106/55 108/64 (!) 119/94 111/74  Pulse: (!) 107 (!) 103 (!) 112 (!) 110  Resp: (!) 22 (!) 22 20 20   Temp:      TempSrc:      SpO2: 97% 96% 96% 96%  Weight:      Height:        Intake/Output Summary (Last 24 hours) at 08/21/2021 1959 Last data filed at 08/21/2021 1820 Gross per 24 hour  Intake 96 ml  Output 900 ml  Net -804 ml   Filed Weights   08/19/21 0432 08/20/21 0410 08/21/21 0500  Weight: 86.3 kg 87.3 kg 87.1 kg    Examination:  General exam: Appears calm and comfortable  Respiratory system: Clear to auscultation. Respiratory effort normal. Cardiovascular system: S1 & S2 heard, RRR. No JVD, murmurs, rubs, gallops or clicks. No pedal edema. Gastrointestinal system: Abdomen is nondistended, soft and nontender. No organomegaly or masses felt. Normal bowel sounds heard. Central nervous system: Alert and oriented. No focal neurological deficits. Extremities: Symmetric 5 x 5 power. Skin: No rashes, lesions or ulcers Psychiatry: Judgement and insight appear normal. Mood & affect appropriate.     Data Reviewed: I have personally reviewed following labs and imaging studies  CBC: Recent Labs  Lab 08/16/21 0431 08/17/21 0550 08/18/21 0508 08/20/21 0417 08/21/21 0500  WBC 12.8* 9.8 10.6* 10.9* 8.2  HGB 13.4 12.7 12.1 12.2 10.9*  HCT 41.0 38.4 37.1 37.5 33.9*  MCV  94.3 95.8 95.9 98.2 97.4  PLT 315 249 224 225 604   Basic Metabolic Panel: Recent Labs  Lab 08/15/21 0440 08/16/21 0600 08/17/21 0550 08/18/21 0508 08/19/21 0430 08/20/21 0417 08/21/21 0500  NA 135   < > 129* 131* 131* 136 133*  K 3.3*   < > 3.4* 5.0 3.6 3.7 4.1  CL 94*   < > 95* 94* 93* 101 95*  CO2 31   < > 27 30 29 29 30   GLUCOSE 73   < > 337* 164* 192* 170* 273*  BUN 58*   < > 56* 43* 38* 28* 24*  CREATININE 0.92   < > 1.03* 0.82 0.90 0.79 0.81  CALCIUM 9.4   < > 8.6* 9.3 9.2 8.8* 8.8*  MG 2.2  --   --   --   --  2.1 2.2  PHOS 4.3  --   --   --   --   --   --    < > = values in this interval not displayed.   GFR: Estimated Creatinine Clearance: 70.3 mL/min (by C-G formula based on SCr of 0.81 mg/dL). Liver Function Tests: Recent Labs  Lab 08/15/21 0440  AST 19  ALT 16  ALKPHOS 138*  BILITOT 0.7  PROT 6.1*  ALBUMIN 2.4*   No results for input(s):  LIPASE, AMYLASE in the last 168 hours. No results for input(s): AMMONIA in the last 168 hours. Coagulation Profile: No results for input(s): INR, PROTIME in the last 168 hours. Cardiac Enzymes: No results for input(s): CKTOTAL, CKMB, CKMBINDEX, TROPONINI in the last 168 hours. BNP (last 3 results) No results for input(s): PROBNP in the last 8760 hours. HbA1C: No results for input(s): HGBA1C in the last 72 hours. CBG: Recent Labs  Lab 08/20/21 2331 08/21/21 0757 08/21/21 1335 08/21/21 1801 08/21/21 1914  GLUCAP 403* 228* 87 218* 191*   Lipid Profile: No results for input(s): CHOL, HDL, LDLCALC, TRIG, CHOLHDL, LDLDIRECT in the last 72 hours. Thyroid Function Tests: No results for input(s): TSH, T4TOTAL, FREET4, T3FREE, THYROIDAB in the last 72 hours. Anemia Panel: No results for input(s): VITAMINB12, FOLATE, FERRITIN, TIBC, IRON, RETICCTPCT in the last 72 hours. Sepsis Labs: No results for input(s): PROCALCITON, LATICACIDVEN in the last 168 hours.   No results found for this or any previous visit (from the  past 240 hour(s)).       Radiology Studies: CARDIAC CATHETERIZATION  Result Date: 08/21/2021 CONCLUSIONS: Bilateral iliofemoral obstruction preventing catheterization.  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 Assumed patency of the left internal mammary over the last 2-3 heart catheterizations of the been performed. Prior hemodialysis access in the left arm preventing access. Prior coronary angiography over the past 2 years has been from right radial with native right coronary, left coronary, and saphenous vein bypass graft angiography and PCI performed from that approach. RECOMMENDATIONS: Medical therapy for coronary disease.  Elevated troponin in the setting of COVID-pneumonia given her underlying coronary disease could be ascribed to demand ischemia. The graft to the diagonal contains overlapping and it x2 layers of stents with distal anastomosis ISR that was recently dilated in July.  This could be the culprit for ongoing angina.  It seems unreasonable to continue to dilate in this territory without a long-term solution. Would not perform coronary angiography again in the future unless the patient is having clear-cut ACS/STEMI.  If in that situation the only recourse would be to use right radial approach as vascular access. Discussed with Dr. Stanford Breed who will try to uptitrate the patient's medical regimen. Discussed in detail with the patient the limited options that we have for her coronary subset.        Scheduled Meds:  (feeding supplement) PROSource Plus  30 mL Oral BID BM   alteplase  2 mg Intracatheter Once   arformoterol  15 mcg Nebulization BID   vitamin C  500 mg Oral Daily   [START ON 08/22/2021] aspirin  81 mg Oral Daily   azaTHIOprine  125 mg Oral Daily   budesonide  2 mL Nebulization BID   calcitRIOL  0.25 mcg Oral Q3 days   Chlorhexidine Gluconate Cloth  6 each Topical Daily   clopidogrel  75 mg  Oral Daily   feeding supplement (GLUCERNA SHAKE)  237 mL Oral TID BM   fluticasone  2 spray Each Nare Daily   guaiFENesin-dextromethorphan  10 mL Oral QID   heparin  5,000 Units Subcutaneous Q8H   hydrocortisone  25 mg Rectal BID   insulin aspart  0-15 Units Subcutaneous TID WC   insulin aspart  0-5 Units Subcutaneous QHS   insulin aspart  8 Units Subcutaneous TID WC   insulin glargine-yfgn  15 Units Subcutaneous Daily   lamoTRIgine  25 mg Oral QHS   linagliptin  5 mg Oral Daily   loratadine  10 mg Oral Daily   magic mouthwash  5 mL Oral QID   mouth rinse  15 mL Mouth Rinse BID   montelukast  10 mg Oral QHS   multivitamin with minerals  1 tablet Oral Daily   nystatin   Topical TID   pantoprazole  40 mg Oral Daily   polyethylene glycol  17 g Oral TID   predniSONE  20 mg Oral Q breakfast   Ensure Max Protein  11 oz Oral Daily   rosuvastatin  20 mg Oral Daily   senna-docusate  2 tablet Oral BID   sertraline  100 mg Oral Daily   sodium chloride flush  10-40 mL Intracatheter Q12H   sodium chloride flush  3 mL Intravenous Q12H   zinc sulfate  220 mg Oral Daily   Continuous Infusions:  sodium chloride       LOS: 17 days    Time spent: 35 minutes    Kathie Dike, MD [facility]   If 7PM-7AM, please contact night-coverage [email or web address]  08/21/2021, 7:59 PM

## 2021-08-21 NOTE — Progress Notes (Addendum)
Progress Note  Patient Name: Katherine [name] Date of Encounter: 08/21/2021  [facility] Cardiologist: [name], MD   Subjective   She is tearful this morning, recounts constipation yesterday. No questions about heart cath today, hopeful to use radial approach.  Inpatient Medications    Scheduled Meds:  (feeding supplement) PROSource Plus  30 mL Oral BID BM   alteplase  2 mg Intracatheter Once   arformoterol  15 mcg Nebulization BID   vitamin C  500 mg Oral Daily   [START ON 08/22/2021] aspirin  81 mg Oral Daily   azaTHIOprine  125 mg Oral Daily   budesonide  2 mL Nebulization BID   calcitRIOL  0.25 mcg Oral Q3 days   Chlorhexidine Gluconate Cloth  6 each Topical Daily   clopidogrel  75 mg Oral Daily   feeding supplement (GLUCERNA SHAKE)  237 mL Oral TID BM   fluticasone  2 spray Each Nare Daily   guaiFENesin-dextromethorphan  10 mL Oral QID   hydrocortisone  25 mg Rectal BID   insulin aspart  0-15 Units Subcutaneous TID WC   insulin aspart  0-5 Units Subcutaneous QHS   insulin aspart  8 Units Subcutaneous TID WC   insulin glargine-yfgn  15 Units Subcutaneous Daily   lamoTRIgine  25 mg Oral QHS   linagliptin  5 mg Oral Daily   loratadine  10 mg Oral Daily   magic mouthwash  5 mL Oral QID   mouth rinse  15 mL Mouth Rinse BID   montelukast  10 mg Oral QHS   multivitamin with minerals  1 tablet Oral Daily   nystatin   Topical TID   pantoprazole  40 mg Oral Daily   polyethylene glycol  17 g Oral TID   predniSONE  20 mg Oral Q breakfast   Ensure Max Protein  11 oz Oral Daily   rosuvastatin  20 mg Oral Daily   senna-docusate  2 tablet Oral BID   sertraline  100 mg Oral Daily   sodium chloride flush  10-40 mL Intracatheter Q12H   sodium chloride flush  3 mL Intravenous Q12H   zinc sulfate  220 mg Oral Daily   Continuous Infusions:  sodium chloride     sodium chloride     heparin 800 Units/hr (08/21/21 0818)   phenylephrine (NEO-SYNEPHRINE) Adult infusion  Stopped (08/20/21 1327)   PRN Meds: sodium chloride, acetaminophen **OR** acetaminophen, cyclobenzaprine, hydrALAZINE, ipratropium-albuterol, LORazepam, menthol-cetylpyridinium, morphine injection, nitroGLYCERIN, ondansetron (ZOFRAN) IV, oxyCODONE, promethazine, sodium chloride, sodium chloride flush, sodium chloride flush   Vital Signs    Vitals:   08/21/21 0400 08/21/21 0500 08/21/21 0600 08/21/21 0800  BP: (!) 95/57 106/67 102/65   Pulse: 94 94 91   Resp: 13 (!) 23 (!) 22   Temp: (!) 97.4 F (36.3 C)   98.1 F (36.7 C)  TempSrc: Axillary   Axillary  SpO2: 91% 96% 91%   Weight:  87.1 kg    Height:        Intake/Output Summary (Last 24 hours) at 08/21/2021 0855 Last data filed at 08/21/2021 0600 Gross per 24 hour  Intake 775.93 ml  Output 1700 ml  Net -924.07 ml   Last 3 Weights 08/21/2021 08/20/2021 08/19/2021  Weight (lbs) 192 lb 0.3 oz 192 lb 7.4 oz 190 lb 4.1 oz  Weight (kg) 87.1 kg 87.3 kg 86.3 kg      Telemetry    Atrial fibrillation with ventricular rates in the 80-90s - Personally Reviewed  ECG  No new tracings - Personally Reviewed  Physical Exam   GEN: No acute distress.   Neck: No JVD Cardiac: irregular rhythm regular rate Respiratory: Clear to auscultation bilaterally. GI: Soft, nontender, non-distended  MS: No edema; No deformity. Neuro:  Nonfocal  Psych: Normal affect   Labs    High Sensitivity Troponin:   Recent Labs  Lab 08/11/21 1431 08/12/21 0311 08/14/21 1845 08/19/21 1003 08/19/21 1135  TROPONINIHS 877* 926* 351* 93* 76*     Chemistry Recent Labs  Lab 08/15/21 0440 08/16/21 0600 08/19/21 0430 08/20/21 0417 08/21/21 0500  NA 135   < > 131* 136 133*  K 3.3*   < > 3.6 3.7 4.1  CL 94*   < > 93* 101 95*  CO2 31   < > 29 29 30   GLUCOSE 73   < > 192* 170* 273*  BUN 58*   < > 38* 28* 24*  CREATININE 0.92   < > 0.90 0.79 0.81  CALCIUM 9.4   < > 9.2 8.8* 8.8*  MG 2.2  --   --  2.1 2.2  PROT 6.1*  --   --   --   --   ALBUMIN 2.4*  --    --   --   --   AST 19  --   --   --   --   ALT 16  --   --   --   --   ALKPHOS 138*  --   --   --   --   BILITOT 0.7  --   --   --   --   GFRNONAA >60   < > >60 >60 >60  ANIONGAP 10   < > 9 6 8    < > = values in this interval not displayed.    Lipids No results for input(s): CHOL, TRIG, HDL, LABVLDL, LDLCALC, CHOLHDL in the last 168 hours.  Hematology Recent Labs  Lab 08/18/21 0508 08/20/21 0417 08/21/21 0500  WBC 10.6* 10.9* 8.2  RBC 3.87 3.82* 3.48*  HGB 12.1 12.2 10.9*  HCT 37.1 37.5 33.9*  MCV 95.9 98.2 97.4  MCH 31.3 31.9 31.3  MCHC 32.6 32.5 32.2  RDW 15.2 15.2 15.6*  PLT 224 225 163   Thyroid No results for input(s): TSH, FREET4 in the last 168 hours.  BNPNo results for input(s): BNP, PROBNP in the last 168 hours.  DDimer No results for input(s): DDIMER in the last 168 hours.   Radiology    No results found.  Cardiac Studies   Echo 08/09/21: 1. Left ventricular ejection fraction, by estimation, is 45 to 50%. The  left ventricle has mildly decreased function. The left ventricle  demonstrates regional wall motion abnormalities (see scoring  diagram/findings for description). There is mild left  ventricular hypertrophy. Left ventricular diastolic function could not be  evaluated.   2. Right ventricular systolic function is normal. The right ventricular  size is normal.   3. The mitral valve is grossly normal. Trivial mitral valve  regurgitation.   4. The aortic valve has an indeterminant number of cusps. There is  moderate calcification of the aortic valve. Aortic valve regurgitation is  not visualized. Aortic valve sclerosis/calcification is present, without  any evidence of aortic stenosis.   Patient Profile     66 y.o. 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 Last cath/PCI was in 02/2021 with complex findings, see EMR for full report. She was admitted 08/04/2021 with acute on chronic hypoxic respiratory failure found to have Covid-19 infection with MRSA pneumonia in setting of COPD. Hospital course notable for LV dysfunction, uncontrolled DM, pressure ulcer of skin POA, hyponatremia, hypokalemia, generalized weakness. Found to have NSTEMI with troponin peak over 4000.   Plan for LHC today.  Assessment & Plan    NSTEMI with chest pain CAD recent PCI 01/2021 Hyperlipidemia with LDL goal 55 - troponin trended to > 4000, now 21 - she denies chest pain today - will proceed with heart cath today - lifelong plavix - if intervention today, may need a brief course of triple therapy   Paroxysmal atrial fibrillation Chronic anticoagulation - rate controlled in the 80-90s - on heparin gtt --> plan to transition to [place] following heart cath   Acute systolic and diastolic heart failure  Hypertension - LVEF 45-50% --> new since echo June 2022 - does not appear volume up, lasix D/C'ed yesterday - heart cath today - still on supplemental O2 - GDMT limited by BP, but is tolerating low dose hydralazine   Hx of renal transplant - continue immunosuppressive therapy - sCr today 0.81. - no ACEI/ARB/ARNI for now  Respiratory failure Recent COVID-19 infection MRSA PNA - breathing better, continue to wean O2 as able    For questions or updates, please contact [facility] Please consult [email or web address] for contact info under   Signed, Ledora Bottcher, PA  08/21/2021, 8:55 AM   As above, patient seen and examined.  She denies chest pain or dyspnea this  morning.  Plan is for cardiac catheterization given recent non-ST elevation myocardial infarction.  The risk and benefits were previously discussed including myocardial infarction, CVA and death and she agrees to proceed.  She remains in atrial fibrillation and her heart rate is controlled.  We will transition back to apixaban at time of discharge.  Will need to continue Plavix given PCI in July. Kirk Ruths

## 2021-08-21 NOTE — H&P (View-Only) (Signed)
Progress Note  Patient Name: Katherine [name] Date of Encounter: 08/21/2021  [facility] Cardiologist: [name], MD   Subjective   She is tearful this morning, recounts constipation yesterday. No questions about heart cath today, hopeful to use radial approach.  Inpatient Medications    Scheduled Meds:  (feeding supplement) PROSource Plus  30 mL Oral BID BM   alteplase  2 mg Intracatheter Once   arformoterol  15 mcg Nebulization BID   vitamin C  500 mg Oral Daily   [START ON 08/22/2021] aspirin  81 mg Oral Daily   azaTHIOprine  125 mg Oral Daily   budesonide  2 mL Nebulization BID   calcitRIOL  0.25 mcg Oral Q3 days   Chlorhexidine Gluconate Cloth  6 each Topical Daily   clopidogrel  75 mg Oral Daily   feeding supplement (GLUCERNA SHAKE)  237 mL Oral TID BM   fluticasone  2 spray Each Nare Daily   guaiFENesin-dextromethorphan  10 mL Oral QID   hydrocortisone  25 mg Rectal BID   insulin aspart  0-15 Units Subcutaneous TID WC   insulin aspart  0-5 Units Subcutaneous QHS   insulin aspart  8 Units Subcutaneous TID WC   insulin glargine-yfgn  15 Units Subcutaneous Daily   lamoTRIgine  25 mg Oral QHS   linagliptin  5 mg Oral Daily   loratadine  10 mg Oral Daily   magic mouthwash  5 mL Oral QID   mouth rinse  15 mL Mouth Rinse BID   montelukast  10 mg Oral QHS   multivitamin with minerals  1 tablet Oral Daily   nystatin   Topical TID   pantoprazole  40 mg Oral Daily   polyethylene glycol  17 g Oral TID   predniSONE  20 mg Oral Q breakfast   Ensure Max Protein  11 oz Oral Daily   rosuvastatin  20 mg Oral Daily   senna-docusate  2 tablet Oral BID   sertraline  100 mg Oral Daily   sodium chloride flush  10-40 mL Intracatheter Q12H   sodium chloride flush  3 mL Intravenous Q12H   zinc sulfate  220 mg Oral Daily   Continuous Infusions:  sodium chloride     sodium chloride     heparin 800 Units/hr (08/21/21 0818)   phenylephrine (NEO-SYNEPHRINE) Adult infusion  Stopped (08/20/21 1327)   PRN Meds: sodium chloride, acetaminophen **OR** acetaminophen, cyclobenzaprine, hydrALAZINE, ipratropium-albuterol, LORazepam, menthol-cetylpyridinium, morphine injection, nitroGLYCERIN, ondansetron (ZOFRAN) IV, oxyCODONE, promethazine, sodium chloride, sodium chloride flush, sodium chloride flush   Vital Signs    Vitals:   08/21/21 0400 08/21/21 0500 08/21/21 0600 08/21/21 0800  BP: (!) 95/57 106/67 102/65   Pulse: 94 94 91   Resp: 13 (!) 23 (!) 22   Temp: (!) 97.4 F (36.3 C)   98.1 F (36.7 C)  TempSrc: Axillary   Axillary  SpO2: 91% 96% 91%   Weight:  87.1 kg    Height:        Intake/Output Summary (Last 24 hours) at 08/21/2021 0855 Last data filed at 08/21/2021 0600 Gross per 24 hour  Intake 775.93 ml  Output 1700 ml  Net -924.07 ml   Last 3 Weights 08/21/2021 08/20/2021 08/19/2021  Weight (lbs) 192 lb 0.3 oz 192 lb 7.4 oz 190 lb 4.1 oz  Weight (kg) 87.1 kg 87.3 kg 86.3 kg      Telemetry    Atrial fibrillation with ventricular rates in the 80-90s - Personally Reviewed  ECG  No new tracings - Personally Reviewed  Physical Exam   GEN: No acute distress.   Neck: No JVD Cardiac: irregular rhythm regular rate Respiratory: Clear to auscultation bilaterally. GI: Soft, nontender, non-distended  MS: No edema; No deformity. Neuro:  Nonfocal  Psych: Normal affect   Labs    High Sensitivity Troponin:   Recent Labs  Lab 08/11/21 1431 08/12/21 0311 08/14/21 1845 08/19/21 1003 08/19/21 1135  TROPONINIHS 877* 926* 351* 93* 76*     Chemistry Recent Labs  Lab 08/15/21 0440 08/16/21 0600 08/19/21 0430 08/20/21 0417 08/21/21 0500  NA 135   < > 131* 136 133*  K 3.3*   < > 3.6 3.7 4.1  CL 94*   < > 93* 101 95*  CO2 31   < > 29 29 30   GLUCOSE 73   < > 192* 170* 273*  BUN 58*   < > 38* 28* 24*  CREATININE 0.92   < > 0.90 0.79 0.81  CALCIUM 9.4   < > 9.2 8.8* 8.8*  MG 2.2  --   --  2.1 2.2  PROT 6.1*  --   --   --   --   ALBUMIN 2.4*  --    --   --   --   AST 19  --   --   --   --   ALT 16  --   --   --   --   ALKPHOS 138*  --   --   --   --   BILITOT 0.7  --   --   --   --   GFRNONAA >60   < > >60 >60 >60  ANIONGAP 10   < > 9 6 8    < > = values in this interval not displayed.    Lipids No results for input(s): CHOL, TRIG, HDL, LABVLDL, LDLCALC, CHOLHDL in the last 168 hours.  Hematology Recent Labs  Lab 08/18/21 0508 08/20/21 0417 08/21/21 0500  WBC 10.6* 10.9* 8.2  RBC 3.87 3.82* 3.48*  HGB 12.1 12.2 10.9*  HCT 37.1 37.5 33.9*  MCV 95.9 98.2 97.4  MCH 31.3 31.9 31.3  MCHC 32.6 32.5 32.2  RDW 15.2 15.2 15.6*  PLT 224 225 163   Thyroid No results for input(s): TSH, FREET4 in the last 168 hours.  BNPNo results for input(s): BNP, PROBNP in the last 168 hours.  DDimer No results for input(s): DDIMER in the last 168 hours.   Radiology    No results found.  Cardiac Studies   Echo 08/09/21: 1. Left ventricular ejection fraction, by estimation, is 45 to 50%. The  left ventricle has mildly decreased function. The left ventricle  demonstrates regional wall motion abnormalities (see scoring  diagram/findings for description). There is mild left  ventricular hypertrophy. Left ventricular diastolic function could not be  evaluated.   2. Right ventricular systolic function is normal. The right ventricular  size is normal.   3. The mitral valve is grossly normal. Trivial mitral valve  regurgitation.   4. The aortic valve has an indeterminant number of cusps. There is  moderate calcification of the aortic valve. Aortic valve regurgitation is  not visualized. Aortic valve sclerosis/calcification is present, without  any evidence of aortic stenosis.   Patient Profile     66 y.o. 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 Last cath/PCI was in 02/2021 with complex findings, see EMR for full report. She was admitted 08/04/2021 with acute on chronic hypoxic respiratory failure found to have Covid-19 infection with MRSA pneumonia in setting of COPD. Hospital course notable for LV dysfunction, uncontrolled DM, pressure ulcer of skin POA, hyponatremia, hypokalemia, generalized weakness. Found to have NSTEMI with troponin peak over 4000.   Plan for LHC today.  Assessment & Plan    NSTEMI with chest pain CAD recent PCI 01/2021 Hyperlipidemia with LDL goal 55 - troponin trended to > 4000, now 64 - she denies chest pain today - will proceed with heart cath today - lifelong plavix - if intervention today, may need a brief course of triple therapy   Paroxysmal atrial fibrillation Chronic anticoagulation - rate controlled in the 80-90s - on heparin gtt --> plan to transition to [place] following heart cath   Acute systolic and diastolic heart failure  Hypertension - LVEF 45-50% --> new since echo June 2022 - does not appear volume up, lasix D/C'ed yesterday - heart cath today - still on supplemental O2 - GDMT limited by BP, but is tolerating low dose hydralazine   Hx of renal transplant - continue immunosuppressive therapy - sCr today 0.81. - no ACEI/ARB/ARNI for now  Respiratory failure Recent COVID-19 infection MRSA PNA - breathing better, continue to wean O2 as able    For questions or updates, please contact [facility] Please consult [email or web address] for contact info under   Signed, Ledora Bottcher, PA  08/21/2021, 8:55 AM   As above, patient seen and examined.  She denies chest pain or dyspnea this  morning.  Plan is for cardiac catheterization given recent non-ST elevation myocardial infarction.  The risk and benefits were previously discussed including myocardial infarction, CVA and death and she agrees to proceed.  She remains in atrial fibrillation and her heart rate is controlled.  We will transition back to apixaban at time of discharge.  Will need to continue Plavix given PCI in July. Kirk Ruths

## 2021-08-21 NOTE — Progress Notes (Signed)
Received by [facility] from [place] to cath lab holding. Heparin infusing at 800U/hr to rt forearm. Resting quietly

## 2021-08-21 NOTE — Progress Notes (Signed)
ANTICOAGULATION CONSULT NOTE  Pharmacy Consult for Heparin Indication: chest pain/ACS  Allergies  Allergen Reactions   Tetracycline Hives    Patient tolerated Doxycycline Dec 2020   Niacin Other (See Comments)    Mouth blisters   Niaspan [Niacin Er] Other (See Comments)    Mouth blisters   Sulfa Antibiotics Nausea Only and Other (See Comments)    "Tears up stomach"   Sulfonamide Derivatives Other (See Comments)    Reaction: per patient "tears her stomach up"   Codeine Nausea And Vomiting   Erythromycin Nausea And Vomiting   Hydromorphone Hcl Nausea And Vomiting   Morphine And Related Nausea And Vomiting   Nalbuphine Nausea And Vomiting    Nubain   Sulfasalazine Nausea Only and Other (See Comments)    per patient "tears her stomach up", "Tears up stomach"   Tape Rash and Other (See Comments)    No "plastic" tape," please----cloth tape only    Patient Measurements: Height: 5\' 1"  (154.9 cm) Weight: 87.1 kg (192 lb 0.3 oz) IBW/kg (Calculated) : 47.8 Heparin Dosing Weight: 66kg  Vital Signs: Temp: 97.4 F (36.3 C) (01/05 0400) Temp Source: Axillary (01/05 0400) BP: 102/65 (01/05 0600) Pulse Rate: 91 (01/05 0600)  Labs: Recent Labs    08/19/21 0430 08/19/21 1003 08/19/21 1135 08/20/21 0417 08/20/21 1345 08/20/21 2130 08/21/21 0500  HGB  --   --   --  12.2  --   --  10.9*  HCT  --   --   --  37.5  --   --  33.9*  PLT  --   --   --  225  --   --  163  HEPARINUNFRC 0.45  --   --  0.13* 0.68 0.51 0.49  CREATININE 0.90  --   --  0.79  --   --  0.81  TROPONINIHS  --  93* 76*  --   --   --   --       Medications:  Infusions:   sodium chloride     sodium chloride     heparin 800 Units/hr (08/21/21 0400)   phenylephrine (NEO-SYNEPHRINE) Adult infusion Stopped (08/20/21 1327)    Assessment: 65 yoF, Covid +, requiring high O2 administration. Complaint of chest pain, Troponin elevated.  PTA Apixaban for Afib transitioned to heparin infusion in the setting of  NSTEMI.   - Last dose Apixaban 12/24 at 0900.  Note that Apixaban will artificially elevate Hep levels, will dose based on aPTT until both levels correlate Troponin: 16, 150, 928, 2835, peak at 4125 >> now downtrending  Heparin level falsely elevated due to recent apixaban.  Today, 08/21/21 - Heparin level 0.49, therapeutic on heparin 800 units/hr - RN reports no IV interruptions or pauses noted.   - CBC:  Hgb decreased (12.2 > 10.9) and Plt decreased to 163k. - RN reported blood in stool on 1/4 afternoon, suspect related to patient's constipation and attempts to self disimpact.  No new bleeding reported overnight or this morning.   - Planning for cardiac cath 1/5.  Remain on heparin per cardiology.  Goal of Therapy:  Heparin level 0.3-0.7 units/ml Monitor platelets by anticoagulation protocol: Yes   Plan:  Continue heparin IV infusion at 800 units/hr  Daily heparin level and CBC F/u cardiac cath plans for 1/5 at 10:30 F/u ability to transition back to Eliquis post procedure.     Gretta Arab PharmD, BCPS Clinical Pharmacist [facility] main pharmacy [phone] 08/21/2021 7:17 AM

## 2021-08-21 NOTE — Progress Notes (Signed)
Inpatient Diabetes Program Recommendations  AACE/ADA: New Consensus Statement on Inpatient Glycemic Control (2015)  Target Ranges:  Prepandial:   less than 140 mg/dL      Peak postprandial:   less than 180 mg/dL (1-2 hours)      Critically ill patients:  140 - 180 mg/dL   Lab Results  Component Value Date   GLUCAP 228 (H) 08/21/2021   HGBA1C 7.9 (H) 08/11/2021    Review of Glycemic Control  Latest Reference Range & Units 08/20/21 08:24 08/20/21 12:27 08/20/21 16:44 08/20/21 21:23 08/20/21 23:31 08/21/21 07:57  Glucose-Capillary 70 - 99 mg/dL 169 (H) 205 (H) 201 (H) 414 (H) 403 (H) 228 (H)   Diabetes history: DM 2 Outpatient Diabetes medications: Basaglar 12 units QAM, Novolog 4-10 units TID, Amaryl 4 mg QD, metformin 500 mg BID, Prednisone 5 mg QD Current orders for Inpatient glycemic control:  Semglee 15 units Daily Novolog 8 units tid meal coverage Novolog 0-15 units tid + hs Tradjenta 5 mg Daily  PO prednisone 20 mg Daily Glucerna tid between meals Ensure Max Daily  Inpatient Diabetes Program Recommendations:    Note meal coverage insulin not given at suppertime and 2 supplements given. PO intake not charted. Watch trends for today.  Thanks,  Tama Headings RN, MSN, BC-ADM Inpatient Diabetes Coordinator Team Pager [phone] (8a-5p)

## 2021-08-21 NOTE — Progress Notes (Signed)
79fr sheath aspirated and removed from left femoral artery. Manual pressure applied for 20 minutes. Site level 0, no s+s of hematoma. Tegaderm dressing applied, bedrest instructions given. Left dp and pt pulses present with doppler. Right distal pulses absent.   Bedrest begins at 13:55:00

## 2021-08-21 NOTE — Interval H&P Note (Signed)
Cath Lab Visit (complete for each Cath Lab visit)  Clinical Evaluation Leading to the Procedure:   ACS: Yes.    Non-ACS:    Anginal Classification: CCS III  Anti-ischemic medical therapy: Maximal Therapy (2 or more classes of medications)  Non-Invasive Test Results: No non-invasive testing performed  Prior CABG: Previous CABG      History and Physical Interval Note:  08/21/2021 11:53 AM  Katherine [name]  has presented today for surgery, with the diagnosis of unstable angina.  The various methods of treatment have been discussed with the patient and family. After consideration of risks, benefits and other options for treatment, the patient has consented to  Procedure(s): LEFT HEART CATH AND CORS/GRAFTS ANGIOGRAPHY (N/A) as a surgical intervention.  The patient's history has been reviewed, patient examined, no change in status, stable for surgery.  I have reviewed the patient's chart and labs.  Questions were answered to the patient's satisfaction.     Belva Crome III

## 2021-08-22 ENCOUNTER — Encounter (HOSPITAL_COMMUNITY): Payer: Self-pay | Admitting: Interventional Cardiology

## 2021-08-22 DIAGNOSIS — I2510 Atherosclerotic heart disease of native coronary artery without angina pectoris: Secondary | ICD-10-CM | POA: Diagnosis not present

## 2021-08-22 DIAGNOSIS — E1165 Type 2 diabetes mellitus with hyperglycemia: Secondary | ICD-10-CM

## 2021-08-22 DIAGNOSIS — U071 COVID-19: Secondary | ICD-10-CM | POA: Diagnosis not present

## 2021-08-22 DIAGNOSIS — I214 Non-ST elevation (NSTEMI) myocardial infarction: Secondary | ICD-10-CM | POA: Diagnosis not present

## 2021-08-22 DIAGNOSIS — Z794 Long term (current) use of insulin: Secondary | ICD-10-CM

## 2021-08-22 DIAGNOSIS — J9621 Acute and chronic respiratory failure with hypoxia: Secondary | ICD-10-CM | POA: Diagnosis not present

## 2021-08-22 LAB — BASIC METABOLIC PANEL
Anion gap: 11 (ref 5–15)
BUN: 20 mg/dL (ref 8–23)
CO2: 27 mmol/L (ref 22–32)
Calcium: 9 mg/dL (ref 8.9–10.3)
Chloride: 99 mmol/L (ref 98–111)
Creatinine, Ser: 0.9 mg/dL (ref 0.44–1.00)
GFR, Estimated: >60 mL/min (ref >60)
Glucose, Bld: 133 mg/dL — ABNORMAL HIGH (ref 70–99)
Potassium: 4.3 mmol/L (ref 3.5–5.1)
Sodium: 137 mmol/L (ref 135–145)

## 2021-08-22 LAB — CBC
HCT: 35.2 % — ABNORMAL LOW (ref 36.0–46.0)
Hemoglobin: 11.3 g/dL — ABNORMAL LOW (ref 12.0–15.0)
MCH: 31.7 pg (ref 26.0–34.0)
MCHC: 32.1 g/dL (ref 30.0–36.0)
MCV: 98.9 fL (ref 80.0–100.0)
Platelets: 149 10*3/uL — ABNORMAL LOW (ref 150–400)
RBC: 3.56 MIL/uL — ABNORMAL LOW (ref 3.87–5.11)
RDW: 15.6 % — ABNORMAL HIGH (ref 11.5–15.5)
WBC: 7.6 10*3/uL (ref 4.0–10.5)
nRBC: 0 % (ref 0.0–0.2)

## 2021-08-22 LAB — GLUCOSE, CAPILLARY
Glucose-Capillary: 177 mg/dL — ABNORMAL HIGH (ref 70–99)
Glucose-Capillary: 170 mg/dL — ABNORMAL HIGH (ref 70–99)
Glucose-Capillary: 297 mg/dL — ABNORMAL HIGH (ref 70–99)
Glucose-Capillary: 296 mg/dL — ABNORMAL HIGH (ref 70–99)

## 2021-08-22 MED ORDER — BISACODYL 10 MG RE SUPP
10.0000 mg | Freq: Once | RECTAL | Status: AC
  Administered 2021-08-22: 10 mg via RECTAL
  Filled 2021-08-22: qty 1

## 2021-08-22 MED ORDER — METOPROLOL SUCCINATE ER 25 MG PO TB24: 12.5000 mg | ORAL_TABLET | Freq: Every day | ORAL | Status: DC

## 2021-08-22 MED ORDER — METOPROLOL SUCCINATE ER 25 MG PO TB24
12.5000 mg | ORAL_TABLET | Freq: Every day | ORAL | Status: DC
  Administered 2021-08-22: 12.5 mg via ORAL
  Filled 2021-08-22: qty 1

## 2021-08-22 MED ORDER — APIXABAN 5 MG PO TABS
5.0000 mg | ORAL_TABLET | Freq: Two times a day (BID) | ORAL | Status: DC
  Administered 2021-08-22 – 2021-08-25 (×7): 5 mg via ORAL
  Filled 2021-08-22 (×7): qty 1

## 2021-08-22 MED ORDER — LINACLOTIDE 145 MCG PO CAPS
145.0000 ug | ORAL_CAPSULE | Freq: Every day | ORAL | Status: DC
  Administered 2021-08-23 – 2021-08-24 (×2): 145 ug via ORAL
  Filled 2021-08-22 (×4): qty 1

## 2021-08-22 MED ORDER — METOPROLOL SUCCINATE ER 25 MG PO TB24: 50.0000 mg | ORAL_TABLET | Freq: Every day | ORAL | Status: DC

## 2021-08-22 NOTE — Progress Notes (Signed)
PROGRESS NOTE    Katherine [name]  IPJ:[id number] DOB: 16-Mar-1956 DOA: 08/04/2021 PCP: [name], MD    Brief Narrative:  66 year old female with a history of CAD/CABG, chronic hypoxic respiratory failure, COPD, diabetes, kidney transplant, paroxysmal atrial fibrillation, admitted to the hospital with generalized weakness and shortness of breath.  She was found to be hypoxic on her normal oxygen requirement.  Found to have COVID-19/MRSA pneumonia.  Treated with antibiotics, remdesivir and steroids.  She was noted to have NSTEMI.  Seen by cardiology with plans for cardiac cath.  Overall respiratory status has improved and appears to be approaching baseline.  She was noted to be hypotensive on 1/3 and started on Neo-Synephrine infusion.  Suspect that she may have an element of adrenal insufficiency, since her steroids are being tapered and she is chronically on prednisone.  She has since been weaned of vasopressors. Cardiac cath attempted on 1/5 but unsuccessful due to vascular issues. Medical management recommended.   Assessment & Plan:   Principal Problem:   Acute on chronic respiratory failure with hypoxia (HCC) Active Problems:   Hypomagnesemia   Dyslipidemia, goal LDL below 70   NSTEMI (non-ST elevated myocardial infarction) (HCC)   CAD S/P percutaneous coronary angioplasty - PCI x 5 to SVG-D1   Essential hypertension   PAD (peripheral artery disease) (HCC)   Paroxysmal atrial fibrillation (HCC); CHA2DS2VASc score F, HTN, CAD, CVA = 5   OSA (obstructive sleep apnea)   GAD (generalized anxiety disorder)   Acute CHF (congestive heart failure) (HCC)   Unstable angina (HCC)   History of renal transplant   Uncontrolled type 2 diabetes mellitus with hyperglycemia, 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   Pressure injury of skin   COVID-19 virus infection   Obesity (BMI 30-39.9)   Chronic diastolic CHF (congestive heart failure) (HCC)   MRSA pneumonia  (HCC)   Positive D dimer   Acute on chronic respiratory failure with hypoxia -Secondary to COVID-19 infection as well as MRSA pneumonia in the setting of COPD -She is chronically on 3 L of oxygen at home -She had worsening respiratory status in the hospital and at one point was requiring heated high flow at 25 L -Currently, she is weaned back down to her baseline requirement of 3 L and feels as though her respiratory status is approaching baseline  MRSA pneumonia -Sputum culture positive for MRSA -Treated with 7 days of doxycycline -Clinically she appears to be improved  COVID-19 pneumonia -Treated with remdesivir and steroids -Clinically appears to be improving  Acute on chronic combined CHF -EF 45-50% -She had been diuresing with IV Lasix -Currently appears to be euvolemic and Lasix has been put on hold -restarted on low dose toprol  Hypotension -Noted to be hypotensive on 1/3, started on Neo-Synephrine infusion -Does not appear to be septic or toxic -Possibly related to some element of adrenal insufficiency since her steroids are being tapered -She received a dose of IV hydrocortisone and her prednisone dose was adjusted -Phenylephrine has since been weaned off, blood pressure remained stable  Type 2 diabetes -Continue basal/bolus insulin -Holding metformin and Amaryl -She is on Tradjenta  History of renal transplant 1991 -Chronically on prednisone and Imuran   NSTEMI -History of CAD status post CABG -Troponin peaked over 4000 -cardiac cath attempted on 1/5 but could not be performed due to multiple vascular issues including obstruction of left femoral artery and occlusion of right femoral artery. Left arm had HD access which prevented radial  approach -recommendations for medical management  Paroxysmal atrial fibrillation -anticoagulated with eliquis -restarted low dose toprol  Hyperlipidemia -Continue statin  Constipation -Manually disimpacted 1/4 -On  MiraLAX -will start on linzess  Generalized weakness -Suspect that she may need skilled nursing facility placement -she is being considered for CIR  DVT prophylaxis: SCDs Start: 08/04/21 0623 Heparin infusion apixaban (ELIQUIS) tablet 5 mg  Code Status: Full code Family Communication: Discussed with patient Disposition Plan: Status is: Inpatient  Remains inpatient appropriate because: Needs further cardiac work-up including cardiac cath       Consultants:  Cardiology Psychiatry  Procedures:    Antimicrobials:  Completed course of doxycycline   Subjective: No chest [name] or shortness of breath. She has not had a bowel movement since disimpaction  Objective: Vitals:   08/22/21 1700 08/22/21 1900 08/22/21 1930 08/22/21 2014  BP:  (!) 138/42    Pulse: 85  93   Resp: 19 16 18    Temp:    99.4 F (37.4 C)  TempSrc:    Oral  SpO2: 94%  95%   Weight:      Height:        Intake/Output Summary (Last 24 hours) at 08/22/2021 2157 Last data filed at 08/22/2021 1737 Gross per 24 hour  Intake 230 ml  Output 1400 ml  Net -1170 ml   Filed Weights   08/20/21 0410 08/21/21 0500 08/22/21 0437  Weight: 87.3 kg 87.1 kg 86.5 kg    Examination:  General exam: Alert, awake, oriented x 3 Respiratory system: Clear to auscultation. Respiratory effort normal. Cardiovascular system:RRR. No murmurs, rubs, gallops. Gastrointestinal system: Abdomen is nondistended, soft and nontender. No organomegaly or masses felt. Normal bowel sounds heard. Central nervous system: Alert and oriented. No focal neurological deficits. Extremities: No C/C/E, +pedal pulses Skin: No rashes, lesions or ulcers Psychiatry: Judgement and insight appear normal. Mood & affect appropriate.      Data Reviewed: I have personally reviewed following labs and imaging studies  CBC: Recent Labs  Lab 08/18/21 0508 08/20/21 0417 08/21/21 0500 08/21/21 2020 08/22/21 0430  WBC 10.6* 10.9* 8.2 8.7 7.6  HGB  12.1 12.2 10.9* 12.2 11.3*  HCT 37.1 37.5 33.9* 37.3 35.2*  MCV 95.9 98.2 97.4 97.6 98.9  PLT 224 225 163 167 277*   Basic Metabolic Panel: Recent Labs  Lab 08/18/21 0508 08/19/21 0430 08/20/21 0417 08/21/21 0500 08/21/21 2020 08/22/21 0430  NA 131* 131* 136 133*  --  137  K 5.0 3.6 3.7 4.1  --  4.3  CL 94* 93* 101 95*  --  99  CO2 30 29 29 30   --  27  GLUCOSE 164* 192* 170* 273*  --  133*  BUN 43* 38* 28* 24*  --  20  CREATININE 0.82 0.90 0.79 0.81 0.79 0.90  CALCIUM 9.3 9.2 8.8* 8.8*  --  9.0  MG  --   --  2.1 2.2  --   --    GFR: Estimated Creatinine Clearance: 63.1 mL/min (by C-G formula based on SCr of 0.9 mg/dL). Liver Function Tests: No results for input(s): AST, ALT, ALKPHOS, BILITOT, PROT, ALBUMIN in the last 168 hours.  No results for input(s): LIPASE, AMYLASE in the last 168 hours. No results for input(s): AMMONIA in the last 168 hours. Coagulation Profile: No results for input(s): INR, PROTIME in the last 168 hours. Cardiac Enzymes: No results for input(s): CKTOTAL, CKMB, CKMBINDEX, TROPONINI in the last 168 hours. BNP (last 3 results) No results for input(s): PROBNP  in the last 8760 hours. HbA1C: No results for input(s): HGBA1C in the last 72 hours. CBG: Recent Labs  Lab 08/21/21 2139 08/22/21 0806 08/22/21 1221 08/22/21 1651 08/22/21 2154  GLUCAP 210* 177* 170* 297* 296*   Lipid Profile: No results for input(s): CHOL, HDL, LDLCALC, TRIG, CHOLHDL, LDLDIRECT in the last 72 hours. Thyroid Function Tests: No results for input(s): TSH, T4TOTAL, FREET4, T3FREE, THYROIDAB in the last 72 hours. Anemia Panel: No results for input(s): VITAMINB12, FOLATE, FERRITIN, TIBC, IRON, RETICCTPCT in the last 72 hours. Sepsis Labs: No results for input(s): PROCALCITON, LATICACIDVEN in the last 168 hours.   No results found for this or any previous visit (from the past 240 hour(s)).       Radiology Studies: CARDIAC CATHETERIZATION  Result Date:  08/21/2021 CONCLUSIONS: Bilateral iliofemoral obstruction preventing catheterization.  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 Assumed patency of the left internal mammary over the last 2-3 heart catheterizations of the been performed. Prior hemodialysis access in the left arm preventing access. Prior coronary angiography over the past 2 years has been from right radial with native right coronary, left coronary, and saphenous vein bypass graft angiography and PCI performed from that approach. RECOMMENDATIONS: Medical therapy for coronary disease.  Elevated troponin in the setting of COVID-pneumonia given her underlying coronary disease could be ascribed to demand ischemia. The graft to the diagonal contains overlapping and it x2 layers of stents with distal anastomosis ISR that was recently dilated in July.  This could be the culprit for ongoing angina.  It seems unreasonable to continue to dilate in this territory without a long-term solution. Would not perform coronary angiography again in the future unless the patient is having clear-cut ACS/STEMI.  If in that situation the only recourse would be to use right radial approach as vascular access. Discussed with Dr. Stanford Breed who will try to uptitrate the patient's medical regimen. Discussed in detail with the patient the limited options that we have for her coronary subset.        Scheduled Meds:  (feeding supplement) PROSource Plus  30 mL Oral BID BM   alteplase  2 mg Intracatheter Once   apixaban  5 mg Oral BID   arformoterol  15 mcg Nebulization BID   vitamin C  500 mg Oral Daily   azaTHIOprine  125 mg Oral Daily   budesonide  2 mL Nebulization BID   calcitRIOL  0.25 mcg Oral Q3 days   Chlorhexidine Gluconate Cloth  6 each Topical Daily   clopidogrel  75 mg Oral Daily   feeding supplement (GLUCERNA SHAKE)  237 mL Oral TID BM   fluticasone  2 spray Each Nare Daily    guaiFENesin-dextromethorphan  10 mL Oral QID   hydrocortisone  25 mg Rectal BID   insulin aspart  0-15 Units Subcutaneous TID WC   insulin aspart  0-5 Units Subcutaneous QHS   insulin aspart  8 Units Subcutaneous TID WC   insulin glargine-yfgn  15 Units Subcutaneous Daily   lamoTRIgine  25 mg Oral QHS   [START ON 08/23/2021] linaclotide  145 mcg Oral QAC breakfast   linagliptin  5 mg Oral Daily   loratadine  10 mg Oral Daily   magic mouthwash  5 mL Oral QID   mouth rinse  15 mL Mouth Rinse BID   metoprolol succinate  12.5 mg Oral Daily   montelukast  10 mg Oral QHS   multivitamin with  minerals  1 tablet Oral Daily   nystatin   Topical TID   pantoprazole  40 mg Oral Daily   polyethylene glycol  17 g Oral TID   predniSONE  20 mg Oral Q breakfast   Ensure Max Protein  11 oz Oral Daily   rosuvastatin  20 mg Oral Daily   senna-docusate  2 tablet Oral BID   sertraline  100 mg Oral Daily   sodium chloride flush  10-40 mL Intracatheter Q12H   sodium chloride flush  3 mL Intravenous Q12H   zinc sulfate  220 mg Oral Daily   Continuous Infusions:  sodium chloride       LOS: 18 days    Time spent: 35 minutes    Kathie Dike, MD [facility]   If 7PM-7AM, please contact night-coverage [email or web address]  08/22/2021, 9:57 PM

## 2021-08-22 NOTE — Progress Notes (Addendum)
Progress Note  Patient Name: Katherine [name] Date of Encounter: 08/22/2021  Primary Cardiologist: [name], MD  Subjective   Reports she had an impaction overnight so had to be disimpacted. Denies CP. Breathing stable. Lying flat in bed restfully.   Last BP in room 112/75.  Inpatient Medications    Scheduled Meds:  (feeding supplement) PROSource Plus  30 mL Oral BID BM   alteplase  2 mg Intracatheter Once   arformoterol  15 mcg Nebulization BID   vitamin C  500 mg Oral Daily   aspirin  81 mg Oral Daily   azaTHIOprine  125 mg Oral Daily   budesonide  2 mL Nebulization BID   calcitRIOL  0.25 mcg Oral Q3 days   Chlorhexidine Gluconate Cloth  6 each Topical Daily   clopidogrel  75 mg Oral Daily   feeding supplement (GLUCERNA SHAKE)  237 mL Oral TID BM   fluticasone  2 spray Each Nare Daily   guaiFENesin-dextromethorphan  10 mL Oral QID   heparin  5,000 Units Subcutaneous Q8H   hydrocortisone  25 mg Rectal BID   insulin aspart  0-15 Units Subcutaneous TID WC   insulin aspart  0-5 Units Subcutaneous QHS   insulin aspart  8 Units Subcutaneous TID WC   insulin glargine-yfgn  15 Units Subcutaneous Daily   lamoTRIgine  25 mg Oral QHS   linagliptin  5 mg Oral Daily   loratadine  10 mg Oral Daily   magic mouthwash  5 mL Oral QID   mouth rinse  15 mL Mouth Rinse BID   montelukast  10 mg Oral QHS   multivitamin with minerals  1 tablet Oral Daily   nystatin   Topical TID   pantoprazole  40 mg Oral Daily   polyethylene glycol  17 g Oral TID   predniSONE  20 mg Oral Q breakfast   Ensure Max Protein  11 oz Oral Daily   rosuvastatin  20 mg Oral Daily   senna-docusate  2 tablet Oral BID   sertraline  100 mg Oral Daily   sodium chloride flush  10-40 mL Intracatheter Q12H   sodium chloride flush  3 mL Intravenous Q12H   zinc sulfate  220 mg Oral Daily   Continuous Infusions:  sodium chloride     PRN Meds: sodium chloride, acetaminophen **OR** acetaminophen,  cyclobenzaprine, hydrALAZINE, ipratropium-albuterol, LORazepam, menthol-cetylpyridinium, morphine injection, nitroGLYCERIN, ondansetron (ZOFRAN) IV, oxyCODONE, promethazine, sodium chloride, sodium chloride flush, sodium chloride flush   Vital Signs    Vitals:   08/22/21 0500 08/22/21 0600 08/22/21 0700 08/22/21 0800  BP: (!) 117/43 99/68 (!) 89/52   Pulse: 96 80 95   Resp: 15 17 14    Temp:    98.1 F (36.7 C)  TempSrc:    Axillary  SpO2: 97% 95% 97%   Weight:      Height:        Intake/Output Summary (Last 24 hours) at 08/22/2021 0842 Last data filed at 08/22/2021 0500 Gross per 24 hour  Intake 230 ml  Output 1000 ml  Net -770 ml   Last 3 Weights 08/22/2021 08/21/2021 08/20/2021  Weight (lbs) 190 lb 11.2 oz 192 lb 0.3 oz 192 lb 7.4 oz  Weight (kg) 86.5 kg 87.1 kg 87.3 kg     Telemetry    Atrial flutter CVR- Personally Reviewed  Physical Exam   GEN: No acute distress.  HEENT: Normocephalic, atraumatic, sclera non-icteric. Neck: No JVD or bruits. Cardiac: Irregularly irregular, rate controlled, no  murmurs, rubs, or gallops.  Respiratory: Diminished BS throughout without wheezing, rales or rhonchi. Breathing is unlabored. GI: Soft, nontender, non-distended, BS +x 4. MS: no deformity. Extremities: No clubbing or cyanosis. No edema. Distal pedal pulses are 2+ and equal bilaterally Left groin cath site without hematoma, ecchymosis, or bruit. Mild bleeding noted on gauze but this has stopped and site is not bleeding. Neuro:  AAOx3. Follows commands. Psych:  Responds to questions appropriately with a normal affect.  Labs    High Sensitivity Troponin:   Recent Labs  Lab 08/11/21 1431 08/12/21 0311 08/14/21 1845 08/19/21 1003 08/19/21 1135  TROPONINIHS 877* 926* 351* 93* 76*      Cardiac EnzymesNo results for input(s): TROPONINI in the last 168 hours. No results for input(s): TROPIPOC in the last 168 hours.   Chemistry Recent Labs  Lab 08/20/21 0417 08/21/21 0500  08/21/21 2020 08/22/21 0430  NA 136 133*  --  137  K 3.7 4.1  --  4.3  CL 101 95*  --  99  CO2 29 30  --  27  GLUCOSE 170* 273*  --  133*  BUN 28* 24*  --  20  CREATININE 0.79 0.81 0.79 0.90  CALCIUM 8.8* 8.8*  --  9.0  GFRNONAA >60 >60 >60 >60  ANIONGAP 6 8  --  11     Hematology Recent Labs  Lab 08/21/21 0500 08/21/21 2020 08/22/21 0430  WBC 8.2 8.7 7.6  RBC 3.48* 3.82* 3.56*  HGB 10.9* 12.2 11.3*  HCT 33.9* 37.3 35.2*  MCV 97.4 97.6 98.9  MCH 31.3 31.9 31.7  MCHC 32.2 32.7 32.1  RDW 15.6* 15.6* 15.6*  PLT 163 167 149*    BNPNo results for input(s): BNP, PROBNP in the last 168 hours.   DDimer No results for input(s): DDIMER in the last 168 hours.   Radiology    CARDIAC CATHETERIZATION  Result Date: 08/21/2021 CONCLUSIONS: Bilateral iliofemoral obstruction preventing catheterization.  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 Assumed patency of the left internal mammary over the last 2-3 heart catheterizations of the been performed. Prior hemodialysis access in the left arm preventing access. Prior coronary angiography over the past 2 years has been from right radial with native right coronary, left coronary, and saphenous vein bypass graft angiography and PCI performed from that approach. RECOMMENDATIONS: Medical therapy for coronary disease.  Elevated troponin in the setting of COVID-pneumonia given her underlying coronary disease could be ascribed to demand ischemia. The graft to the diagonal contains overlapping and it x2 layers of stents with distal anastomosis ISR that was recently dilated in July.  This could be the culprit for ongoing angina.  It seems unreasonable to continue to dilate in this territory without a long-term solution. Would not perform coronary angiography again in the future unless the patient is having clear-cut ACS/STEMI.  If in that situation the only recourse would be to  use right radial approach as vascular access. Discussed with Dr. Stanford Breed who will try to uptitrate the patient's medical regimen. Discussed in detail with the patient the limited options that we have for her coronary subset.    Cardiac Studies   Echo 08/09/21: 1. Left ventricular ejection fraction, by estimation, is 45 to 50%. The  left ventricle has mildly decreased function. The left ventricle  demonstrates regional wall motion abnormalities (see scoring  diagram/findings for description). There is mild left  ventricular hypertrophy. Left ventricular diastolic function  could not be  evaluated.   2. Right ventricular systolic function is normal. The right ventricular  size is normal.   3. The mitral valve is grossly normal. Trivial mitral valve  regurgitation.   4. The aortic valve has an indeterminant number of cusps. There is  moderate calcification of the aortic valve. Aortic valve regurgitation is  not visualized. Aortic valve sclerosis/calcification is present, without  any evidence of aortic stenosis.  LHC 08/21/21 CONCLUSIONS: Bilateral iliofemoral obstruction preventing catheterization.  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 Assumed patency of the left internal mammary over the last 2-3 heart catheterizations of the been performed. Prior hemodialysis access in the left arm preventing access. Prior coronary angiography over the past 2 years has been from right radial with native right coronary, left coronary, and saphenous vein bypass graft angiography and PCI performed from that approach.   RECOMMENDATIONS:   Medical therapy for coronary disease.  Elevated troponin in the setting of COVID-pneumonia given her underlying coronary disease could be ascribed to demand ischemia. The graft to the diagonal contains overlapping and it x2 layers of stents with distal anastomosis ISR that was recently  dilated in July.  This could be the culprit for ongoing angina.  It seems unreasonable to continue to dilate in this territory without a long-term solution. Would not perform coronary angiography again in the future unless the patient is having clear-cut ACS/STEMI.  If in that situation the only recourse would be to use right radial approach as vascular access. Discussed with Dr. Stanford Breed who will try to uptitrate the patient's medical regimen. Discussed in detail with the patient the limited options that we have for her coronary subset.  Patient Profile     66 y.o. 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 Last cath/PCI was in 02/2021 with complex findings, see EMR for full report. She was admitted 08/04/2021 with acute on chronic hypoxic respiratory failure found to have Covid-19 infection with MRSA pneumonia in setting of COPD. Hospital course notable for LV dysfunction, uncontrolled DM, pressure ulcer of skin POA, hyponatremia, hypokalemia, generalized weakness. Found to have NSTEMI with troponin peak over 4000.  Assessment & Plan    1. NSTEMI with history of CAD s/p CABG 1994 and numerous PCIs since that time - peak troponin 4125 on 08/10/21 - 2D echo 08/09/21 EF 45-50%, mild LVH, no significant AS seen - per earlier notes felt to represent possible graft loss - cardiac cath 08/21/21 as above, graft to diag with distal anastomosis which could be culprit, recommended for medical therapy and avoidance of cath in future  unless clear cut ACS - on Plavix + Eliquis PTA - will discuss need for ASA with MD (clarification requested by pharmacy per staff msg) -- unable to titrate anti-anginal therapy at this time due to soft BP - BB/Imdur discontinued earlier this admission   2. Acute combined CHF - LVEF 45-50% by echo 08/09/21, down from 55-60% in 01/2021 - may be combination of ischemic + stress induced cardiomyopathy -  BP too soft for GDMT, no longer requiring IV Lasix at this time - volume looks OK, continue to follow   3. Paroxysmal atrial fibrillation/flutter - was in sinus then went into atrial fib/flutter 08/19/21 - will review plans with MD, anticipate resumption of DOAC today - check thyroid with AM labs   4. Acute on chronic respiratory failure in setting of Covid-19 and MRSA PNA - treated with remdesivir, IV steroids, doxycycline - on 1/4 asked nursing to review with IM re: precautions as her infection tag indicates to continue through 08/24/21 - mgmt per primary team   5. H/o kidney transplant - on chronic immunosuppressive therapy   6. H/o prior Torsades in setting of Tikosyn, electrolyte disturbance - last Mg, K at goal   7. Carotid disease - can re-evaluate updated duplex as OP   For questions or updates, please contact [place] Please consult [email or web address] for contact info under Cardiology/STEMI.  Signed, Charlie Pitter, PA-C 08/22/2021, 8:42 AM   As above, patient seen and examined.  She denies chest pain or dyspnea this morning.  Attempt at cardiac catheterization yesterday by Dr. Tamala Julian is as outlined.  There was obstruction of the left femoral artery and total occlusion of the right femoral artery.  There was also hemodialysis access in the left arm preventing access.  The procedure was therefore not performed and recommendation was not to repeat catheterization unless patient having clear-cut acute coronary syndrome/ST elevation myocardial infarction.  We will try medical therapy.   Continue Plavix but discontinue aspirin.  Continue statin.  Blood pressure has been borderline.  We will resume Toprol 50 mg daily both for antianginal effect and for rate control of atrial flutter.  Resume apixaban.  Patient has an appointment scheduled to see Dr. Ellyn Hack in February.  Cardiology will sign off.  Please call with questions.  Other issues including pneumonia, history of renal transplant and deconditioning per primary service.  Kirk Ruths, MD

## 2021-08-22 NOTE — Progress Notes (Signed)
Inpatient Rehab Admissions Coordinator:  ° °Patient was screened for CIR candidacy by Natoya Viscomi, MS, CCC-SLP. At this time, Pt. Appears to be a a potential candidate for CIR. I will place  order for rehab consult per protocol for full assessment. Please contact me any with questions. ° °Katherine Bradner, MS, CCC-SLP °Rehab Admissions Coordinator  °[phone] (celll) °[phone] (office) ° °

## 2021-08-22 NOTE — Progress Notes (Signed)
Has f/u in Feb 2023 with Dr. Ellyn Hack. Per d/w MD, will keep.

## 2021-08-23 DIAGNOSIS — J9621 Acute and chronic respiratory failure with hypoxia: Secondary | ICD-10-CM | POA: Diagnosis not present

## 2021-08-23 DIAGNOSIS — E1165 Type 2 diabetes mellitus with hyperglycemia: Secondary | ICD-10-CM | POA: Diagnosis not present

## 2021-08-23 DIAGNOSIS — U071 COVID-19: Secondary | ICD-10-CM | POA: Diagnosis not present

## 2021-08-23 DIAGNOSIS — I2510 Atherosclerotic heart disease of native coronary artery without angina pectoris: Secondary | ICD-10-CM | POA: Diagnosis not present

## 2021-08-23 LAB — GLUCOSE, CAPILLARY
Glucose-Capillary: 173 mg/dL — ABNORMAL HIGH (ref 70–99)
Glucose-Capillary: 237 mg/dL — ABNORMAL HIGH (ref 70–99)
Glucose-Capillary: 245 mg/dL — ABNORMAL HIGH (ref 70–99)
Glucose-Capillary: 217 mg/dL — ABNORMAL HIGH (ref 70–99)

## 2021-08-23 LAB — CBC
HCT: 31.5 % — ABNORMAL LOW (ref 36.0–46.0)
Hemoglobin: 10 g/dL — ABNORMAL LOW (ref 12.0–15.0)
MCH: 31.9 pg (ref 26.0–34.0)
MCHC: 31.7 g/dL (ref 30.0–36.0)
MCV: 100.6 fL — ABNORMAL HIGH (ref 80.0–100.0)
Platelets: 120 K/uL — ABNORMAL LOW (ref 150–400)
RBC: 3.13 MIL/uL — ABNORMAL LOW (ref 3.87–5.11)
RDW: 15.6 % — ABNORMAL HIGH (ref 11.5–15.5)
WBC: 6.3 K/uL (ref 4.0–10.5)
nRBC: 0 % (ref 0.0–0.2)

## 2021-08-23 LAB — MAGNESIUM: Magnesium: 2.3 mg/dL (ref 1.7–2.4)

## 2021-08-23 LAB — BASIC METABOLIC PANEL WITH GFR
Anion gap: 4 — ABNORMAL LOW (ref 5–15)
BUN: 23 mg/dL (ref 8–23)
CO2: 32 mmol/L (ref 22–32)
Calcium: 8.8 mg/dL — ABNORMAL LOW (ref 8.9–10.3)
Chloride: 97 mmol/L — ABNORMAL LOW (ref 98–111)
Creatinine, Ser: 0.82 mg/dL (ref 0.44–1.00)
GFR, Estimated: >60 mL/min
Glucose, Bld: 188 mg/dL — ABNORMAL HIGH (ref 70–99)
Potassium: 4 mmol/L (ref 3.5–5.1)
Sodium: 133 mmol/L — ABNORMAL LOW (ref 135–145)

## 2021-08-23 LAB — TSH: TSH: 0.736 u[IU]/mL (ref 0.350–4.500)

## 2021-08-23 NOTE — Progress Notes (Signed)
Physical Therapy Treatment Patient Details Name: Katherine [name] MRN: [id number] DOB: November 02, 1955 Today's Date: 08/23/2021   History of Present Illness Pt admitted from home 2* SOB with COVID PNA and with hx of CAD, CABG, PAD, PAF, COPD, Fibromyalgia, R eye CVA with residual blindness, kidney transplant and obesity    PT Comments    Pt motivated this am to mobilize and up to ambulate in hall - total of 105' with two seated rest breaks 2* fatigue to complete task.   Recommendations for follow up therapy are one component of a multi-disciplinary discharge planning process, led by the attending physician.  Recommendations may be updated based on patient status, additional functional criteria and insurance authorization.  Follow Up Recommendations  Skilled nursing-short term rehab (<3 hours/day)     Assistance Recommended at Discharge Frequent or constant Supervision/Assistance  Patient can return home with the following A lot of help with bathing/dressing/bathroom;A lot of help with walking and/or transfers;Assistance with cooking/housework;A little help with walking and/or transfers   Equipment Recommendations       Recommendations for Other Services       Precautions / Restrictions Precautions Precautions: Fall Precaution Comments: monitor sats/HR Restrictions Weight Bearing Restrictions: No     Mobility  Bed Mobility               General bed mobility comments: Pt up in recliner and requests back to same    Transfers Overall transfer level: Needs assistance Equipment used: Rolling walker (2 wheels) Transfers: Sit to/from Stand Sit to Stand: Min guard           General transfer comment: Steady assist with cues for use of UEs    Ambulation/Gait Ambulation/Gait assistance: Min assist;Min guard;+2 safety/equipment Gait Distance (Feet): 45 Feet (and 35 and 25' with seated rest breaks) Assistive device: Rolling walker (2 wheels) Gait Pattern/deviations:  Step-to pattern;Step-through pattern;Decreased step length - right;Decreased step length - left;Shuffle Gait velocity: decr         Stairs             Wheelchair Mobility    Modified Rankin (Stroke Patients Only)       Balance Overall balance assessment: Needs assistance Sitting-balance support: Feet supported;No upper extremity supported Sitting balance-Leahy Scale: Good     Standing balance support: Bilateral upper extremity supported;Reliant on assistive device for balance;During functional activity Standing balance-Leahy Scale: Poor                              Cognition Arousal/Alertness: Awake/alert Behavior During Therapy: WFL for tasks assessed/performed Overall Cognitive Status: Within Functional Limits for tasks assessed                                 General Comments: ready to mobilize today        Exercises      General Comments        Pertinent Vitals/Pain Pain Assessment: Faces Faces Pain Scale: Hurts little more Pain Location: "my bottom" Pain Descriptors / Indicators: Sore Pain Intervention(s): Limited activity within patient's tolerance;Monitored during session;Repositioned    Home Living                          Prior Function            PT Goals (current goals can now be found in the  care plan section) Acute Rehab PT Goals Patient Stated Goal: HOME PT Goal Formulation: With patient Time For Goal Achievement: 08/29/21 Potential to Achieve Goals: Good Progress towards PT goals: Progressing toward goals    Frequency    Min 3X/week      PT Plan Current plan remains appropriate;Frequency needs to be updated;Discharge plan needs to be updated    Co-evaluation              AM-PAC PT "6 Clicks" Mobility   Outcome Measure  Help needed turning from your back to your side while in a flat bed without using bedrails?: A Lot Help needed moving from lying on your back to sitting on  the side of a flat bed without using bedrails?: A Lot Help needed moving to and from a bed to a chair (including a wheelchair)?: A Lot Help needed standing up from a chair using your arms (e.g., wheelchair or bedside chair)?: A Little Help needed to walk in hospital room?: A Little Help needed climbing 3-5 steps with a railing? : A Lot 6 Click Score: 14    End of Session Equipment Utilized During Treatment: Gait belt;Oxygen Activity Tolerance: Patient limited by fatigue Patient left: in chair;with call bell/phone within reach;with chair alarm set Nurse Communication: Mobility status PT Visit Diagnosis: Difficulty in walking, not elsewhere classified (R26.2)     Time: 4103-0131 PT Time Calculation (min) (ACUTE ONLY): 27 min  Charges:  $Gait Training: 23-37 mins                     [place] Pager [phone] Office [phone]    Vere Diantonio 08/23/2021, 12:52 PM

## 2021-08-23 NOTE — Progress Notes (Signed)
PROGRESS NOTE    Katherine [name]  ZRA:[id number] DOB: 04/02/1956 DOA: 08/04/2021 PCP: [name], MD    Brief Narrative:  66 year old female with a history of CAD/CABG, chronic hypoxic respiratory failure, COPD, diabetes, kidney transplant, paroxysmal atrial fibrillation, admitted to the hospital with generalized weakness and shortness of breath.  She was found to be hypoxic on her normal oxygen requirement.  Found to have COVID-19/MRSA pneumonia.  Treated with antibiotics, remdesivir and steroids.  She was noted to have NSTEMI.  Seen by cardiology with plans for cardiac cath.  Overall respiratory status has improved and appears to be approaching baseline.  She was noted to be hypotensive on 1/3 and started on Neo-Synephrine infusion.  Suspect that she may have an element of adrenal insufficiency, since her steroids are being tapered and she is chronically on prednisone.  She has since been weaned of vasopressors. Cardiac cath attempted on 1/5 but unsuccessful due to vascular issues. Medical management recommended.   Assessment & Plan:   Principal Problem:   Acute on chronic respiratory failure with hypoxia (HCC) Active Problems:   Hypomagnesemia   Dyslipidemia, goal LDL below 70   NSTEMI (non-ST elevated myocardial infarction) (HCC)   CAD S/P percutaneous coronary angioplasty - PCI x 5 to SVG-D1   Essential hypertension   PAD (peripheral artery disease) (HCC)   Paroxysmal atrial fibrillation (HCC); CHA2DS2VASc score F, HTN, CAD, CVA = 5   OSA (obstructive sleep apnea)   GAD (generalized anxiety disorder)   Acute CHF (congestive heart failure) (HCC)   Unstable angina (HCC)   History of renal transplant   Uncontrolled type 2 diabetes mellitus with hyperglycemia, 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   Pressure injury of skin   COVID-19 virus infection   Obesity (BMI 30-39.9)   Chronic diastolic CHF (congestive heart failure) (HCC)   MRSA pneumonia  (HCC)   Positive D dimer   Acute on chronic respiratory failure with hypoxia -Secondary to COVID-19 infection as well as MRSA pneumonia in the setting of COPD -She is chronically on 3 L of oxygen at home -She had worsening respiratory status in the hospital and at one point was requiring heated high flow at 25 L -Currently, she is weaned back down to her baseline requirement of 3 L and feels as though her respiratory status is approaching baseline  MRSA pneumonia -Sputum culture positive for MRSA -Treated with 7 days of doxycycline -Clinically she appears to be improved  COVID-19 pneumonia -Treated with remdesivir and steroids -Clinically appears to be improving  Acute on chronic combined CHF -EF 45-50% -She had been diuresing with IV Lasix -Currently appears to be euvolemic and Lasix has been put on hold -Further management limited by low blood pressures  Hypotension -Noted to be hypotensive on 1/3, started on Neo-Synephrine infusion -Does not appear to be septic or toxic -Possibly related to some element of adrenal insufficiency since her steroids are being tapered -She received a dose of IV hydrocortisone and her prednisone dose was adjusted -Phenylephrine has since been weaned off, blood pressure remained stable  Type 2 diabetes -Continue basal/bolus insulin -Holding metformin and Amaryl -She is on Tradjenta  History of renal transplant 1991 -Chronically on prednisone and Imuran   NSTEMI -History of CAD status post CABG -Troponin peaked over 4000 -cardiac cath attempted on 1/5 but could not be performed due to multiple vascular issues including obstruction of left femoral artery and occlusion of right femoral artery. Left arm had HD access which  prevented radial approach -recommendations for medical management  Paroxysmal atrial fibrillation -anticoagulated with eliquis -She received low-dose Toprol on 1/6, subsequent blood pressures were in the low 90s.  We will  hold off on any further rate control medications for now.  Heart rate is currently in the 80s and 90s.  Hyperlipidemia -Continue statin  Constipation -Manually disimpacted 1/4 -On MiraLAX -Started on Linzess  Generalized weakness -Suspect that she may need skilled nursing facility placement -she is being considered for CIR versus SNF  DVT prophylaxis: SCDs Start: 08/04/21 0623  apixaban (ELIQUIS) tablet 5 mg  Code Status: Full code Family Communication: Discussed with patient Disposition Plan: Status is: Inpatient  Remains inpatient appropriate because: Need appropriate discharge plan      Consultants:  Cardiology Psychiatry  Procedures:    Antimicrobials:  Completed course of doxycycline   Subjective: She is sitting up in chair in her breakfast.  Overall she is feeling better.  No shortness of breath.  Objective: Vitals:   08/23/21 1500 08/23/21 1513 08/23/21 1614 08/23/21 1616  BP: 95/68   (!) 111/53  Pulse:    79  Resp: 18 17  15   Temp:   98 F (36.7 C)   TempSrc:   Oral   SpO2:    94%  Weight:      Height:        Intake/Output Summary (Last 24 hours) at 08/23/2021 1926 Last data filed at 08/23/2021 0500 Gross per 24 hour  Intake 340 ml  Output 690 ml  Net -350 ml   Filed Weights   08/21/21 0500 08/22/21 0437 08/23/21 0500  Weight: 87.1 kg 86.5 kg 87 kg    Examination:  General exam: Alert, awake, oriented x 3 Respiratory system: Clear to auscultation. Respiratory effort normal. Cardiovascular system:RRR. No murmurs, rubs, gallops. Gastrointestinal system: Abdomen is nondistended, soft and nontender. No organomegaly or masses felt. Normal bowel sounds heard. Central nervous system: Alert and oriented. No focal neurological deficits. Extremities: No C/C/E, +pedal pulses Skin: No rashes, lesions or ulcers Psychiatry: Judgement and insight appear normal. Mood & affect appropriate.     Data Reviewed: I have personally reviewed following labs  and imaging studies  CBC: Recent Labs  Lab 08/20/21 0417 08/21/21 0500 08/21/21 2020 08/22/21 0430 08/23/21 0545  WBC 10.9* 8.2 8.7 7.6 6.3  HGB 12.2 10.9* 12.2 11.3* 10.0*  HCT 37.5 33.9* 37.3 35.2* 31.5*  MCV 98.2 97.4 97.6 98.9 100.6*  PLT 225 163 167 149* 502*   Basic Metabolic Panel: Recent Labs  Lab 08/19/21 0430 08/20/21 0417 08/21/21 0500 08/21/21 2020 08/22/21 0430 08/23/21 0545  NA 131* 136 133*  --  137 133*  K 3.6 3.7 4.1  --  4.3 4.0  CL 93* 101 95*  --  99 97*  CO2 29 29 30   --  27 32  GLUCOSE 192* 170* 273*  --  133* 188*  BUN 38* 28* 24*  --  20 23  CREATININE 0.90 0.79 0.81 0.79 0.90 0.82  CALCIUM 9.2 8.8* 8.8*  --  9.0 8.8*  MG  --  2.1 2.2  --   --  2.3   GFR: Estimated Creatinine Clearance: 69.5 mL/min (by C-G formula based on SCr of 0.82 mg/dL). Liver Function Tests: No results for input(s): AST, ALT, ALKPHOS, BILITOT, PROT, ALBUMIN in the last 168 hours.  No results for input(s): LIPASE, AMYLASE in the last 168 hours. No results for input(s): AMMONIA in the last 168 hours. Coagulation Profile: No results for  input(s): INR, PROTIME in the last 168 hours. Cardiac Enzymes: No results for input(s): CKTOTAL, CKMB, CKMBINDEX, TROPONINI in the last 168 hours. BNP (last 3 results) No results for input(s): PROBNP in the last 8760 hours. HbA1C: No results for input(s): HGBA1C in the last 72 hours. CBG: Recent Labs  Lab 08/22/21 1651 08/22/21 2154 08/23/21 0737 08/23/21 1139 08/23/21 1612  GLUCAP 297* 296* 173* 237* 245*   Lipid Profile: No results for input(s): CHOL, HDL, LDLCALC, TRIG, CHOLHDL, LDLDIRECT in the last 72 hours. Thyroid Function Tests: Recent Labs    08/23/21 0545  TSH 0.736   Anemia Panel: No results for input(s): VITAMINB12, FOLATE, FERRITIN, TIBC, IRON, RETICCTPCT in the last 72 hours. Sepsis Labs: No results for input(s): PROCALCITON, LATICACIDVEN in the last 168 hours.   No results found for this or any previous  visit (from the past 240 hour(s)).       Radiology Studies: No results found.      Scheduled Meds:  (feeding supplement) PROSource Plus  30 mL Oral BID BM   alteplase  2 mg Intracatheter Once   apixaban  5 mg Oral BID   arformoterol  15 mcg Nebulization BID   vitamin C  500 mg Oral Daily   azaTHIOprine  125 mg Oral Daily   budesonide  2 mL Nebulization BID   calcitRIOL  0.25 mcg Oral Q3 days   Chlorhexidine Gluconate Cloth  6 each Topical Daily   clopidogrel  75 mg Oral Daily   feeding supplement (GLUCERNA SHAKE)  237 mL Oral TID BM   fluticasone  2 spray Each Nare Daily   guaiFENesin-dextromethorphan  10 mL Oral QID   hydrocortisone  25 mg Rectal BID   insulin aspart  0-15 Units Subcutaneous TID WC   insulin aspart  0-5 Units Subcutaneous QHS   insulin aspart  8 Units Subcutaneous TID WC   insulin glargine-yfgn  15 Units Subcutaneous Daily   lamoTRIgine  25 mg Oral QHS   linaclotide  145 mcg Oral QAC breakfast   linagliptin  5 mg Oral Daily   loratadine  10 mg Oral Daily   magic mouthwash  5 mL Oral QID   mouth rinse  15 mL Mouth Rinse BID   montelukast  10 mg Oral QHS   multivitamin with minerals  1 tablet Oral Daily   nystatin   Topical TID   pantoprazole  40 mg Oral Daily   polyethylene glycol  17 g Oral TID   predniSONE  20 mg Oral Q breakfast   Ensure Max Protein  11 oz Oral Daily   rosuvastatin  20 mg Oral Daily   senna-docusate  2 tablet Oral BID   sertraline  100 mg Oral Daily   sodium chloride flush  10-40 mL Intracatheter Q12H   sodium chloride flush  3 mL Intravenous Q12H   zinc sulfate  220 mg Oral Daily   Continuous Infusions:  sodium chloride       LOS: 19 days    Time spent: 35 minutes    Kathie Dike, MD [facility]   If 7PM-7AM, please contact night-coverage [email or web address]  08/23/2021, 7:26 PM

## 2021-08-24 DIAGNOSIS — E1165 Type 2 diabetes mellitus with hyperglycemia: Secondary | ICD-10-CM | POA: Diagnosis not present

## 2021-08-24 DIAGNOSIS — I2510 Atherosclerotic heart disease of native coronary artery without angina pectoris: Secondary | ICD-10-CM | POA: Diagnosis not present

## 2021-08-24 DIAGNOSIS — U071 COVID-19: Secondary | ICD-10-CM | POA: Diagnosis not present

## 2021-08-24 DIAGNOSIS — J9621 Acute and chronic respiratory failure with hypoxia: Secondary | ICD-10-CM | POA: Diagnosis not present

## 2021-08-24 LAB — BASIC METABOLIC PANEL
Anion gap: 11 (ref 5–15)
BUN: 20 mg/dL (ref 8–23)
CO2: 30 mmol/L (ref 22–32)
Calcium: 9.4 mg/dL (ref 8.9–10.3)
Chloride: 96 mmol/L — ABNORMAL LOW (ref 98–111)
Creatinine, Ser: 0.81 mg/dL (ref 0.44–1.00)
GFR, Estimated: >60 mL/min (ref >60)
Glucose, Bld: 254 mg/dL — ABNORMAL HIGH (ref 70–99)
Potassium: 4.9 mmol/L (ref 3.5–5.1)
Sodium: 137 mmol/L (ref 135–145)

## 2021-08-24 LAB — GLUCOSE, CAPILLARY
Glucose-Capillary: 295 mg/dL — ABNORMAL HIGH (ref 70–99)
Glucose-Capillary: 227 mg/dL — ABNORMAL HIGH (ref 70–99)
Glucose-Capillary: 132 mg/dL — ABNORMAL HIGH (ref 70–99)
Glucose-Capillary: 166 mg/dL — ABNORMAL HIGH (ref 70–99)

## 2021-08-24 MED ORDER — INSULIN GLARGINE-YFGN 100 UNIT/ML ~~LOC~~ SOLN
20.0000 [IU] | Freq: Every day | SUBCUTANEOUS | Status: DC
  Administered 2021-08-24 – 2021-08-25 (×2): 20 [IU] via SUBCUTANEOUS
  Filled 2021-08-24 (×2): qty 0.2

## 2021-08-24 NOTE — TOC CM/SW Note (Signed)
30 Day Pass[facility] Note  RE: Katherine [name]  Date of Birth:03-07-1956  Date:08/24/2021     To Whom It May Concern:  Please be advised that the above-named patient will require a short-term nursing home stay - anticipated 30 days or less for rehabilitation and strengthening.  The plan is for return home.   Evette Cristal, MSW, [place] Mike [phone]

## 2021-08-24 NOTE — Progress Notes (Signed)
Inpatient Rehab Admissions Coordinator:   I spoke with Pt. And niece regarding potential CIR admission. She is interested and states that she would like to pursue CIR. She has 24/7 support at home, as she lives with her brother and god-daughter. I will follow up in the morning if we have a bed for her.   Clemens Catholic, [place], [place] Admissions Coordinator  [phone] ([place]) [phone] (office)

## 2021-08-24 NOTE — Progress Notes (Signed)
PROGRESS NOTE    Katherine [name]  ELF:[id number] DOB: 1956-06-26 DOA: 08/04/2021 PCP: [name], MD    Brief Narrative:  66 year old female with a history of CAD/CABG, chronic hypoxic respiratory failure, COPD, diabetes, kidney transplant, paroxysmal atrial fibrillation, admitted to the hospital with generalized weakness and shortness of breath.  She was found to be hypoxic on her normal oxygen requirement.  Found to have COVID-19/MRSA pneumonia.  Treated with antibiotics, remdesivir and steroids.  She was noted to have NSTEMI.  Seen by cardiology with plans for cardiac cath.  Overall respiratory status has improved and appears to be approaching baseline.  She was noted to be hypotensive on 1/3 and started on Neo-Synephrine infusion.  Suspect that she may have an element of adrenal insufficiency, since her steroids are being tapered and she is chronically on prednisone.  She has since been weaned of vasopressors. Cardiac cath attempted on 1/5 but unsuccessful due to vascular issues. Medical management recommended.  Currently, she is stable for discharge.  Discharge disposition include CIR versus skilled nursing facility.   Assessment & Plan:   Principal Problem:   Acute on chronic respiratory failure with hypoxia (HCC) Active Problems:   Hypomagnesemia   Dyslipidemia, goal LDL below 70   NSTEMI (non-ST elevated myocardial infarction) (HCC)   CAD S/P percutaneous coronary angioplasty - PCI x 5 to SVG-D1   Essential hypertension   PAD (peripheral artery disease) (HCC)   Paroxysmal atrial fibrillation (HCC); CHA2DS2VASc score F, HTN, CAD, CVA = 5   OSA (obstructive sleep apnea)   GAD (generalized anxiety disorder)   Acute CHF (congestive heart failure) (HCC)   Unstable angina (HCC)   History of renal transplant   Uncontrolled type 2 diabetes mellitus with hyperglycemia, 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   Pressure injury of skin   COVID-19 virus  infection   Obesity (BMI 30-39.9)   Chronic diastolic CHF (congestive heart failure) (HCC)   MRSA pneumonia (HCC)   Positive D dimer   Acute on chronic respiratory failure with hypoxia -Secondary to COVID-19 infection as well as MRSA pneumonia in the setting of COPD -She is chronically on 3 L of oxygen at home -She had worsening respiratory status in the hospital and at one point was requiring heated high flow at 25 L -Currently, she is weaned back down to her baseline requirement of 3 L and feels as though her respiratory status is approaching baseline  MRSA pneumonia -Sputum culture positive for MRSA -Treated with 7 days of doxycycline -Clinically she appears to be improved  COVID-19 pneumonia -Treated with remdesivir and steroids -Clinically appears to be improving  Acute on chronic combined CHF -EF 45-50% -She had been diuresing with IV Lasix -Currently appears to be euvolemic and Lasix has been put on hold -Further GDMT limited by low blood pressures  Hypotension -Noted to be hypotensive on 1/3, started on Neo-Synephrine infusion -Does not appear to be septic or toxic -Possibly related to some element of adrenal insufficiency since her steroids are being tapered -She received a dose of IV hydrocortisone and her prednisone dose was adjusted -Phenylephrine has since been weaned off, blood pressure remained stable  Type 2 diabetes -Continue basal/bolus insulin -Holding metformin and Amaryl -She is on Tradjenta  History of renal transplant 1991 -Chronically on prednisone and Imuran   NSTEMI -History of CAD status post CABG -Troponin peaked over 4000 -cardiac cath attempted on 1/5 but could not be performed due to multiple vascular issues including obstruction  of left femoral artery and occlusion of right femoral artery. Left arm had HD access which prevented radial approach -recommendations for medical management  Paroxysmal atrial fibrillation -anticoagulated with  eliquis -She received low-dose Toprol on 1/6, subsequent blood pressures were in the low 90s.  We will hold off on any further rate control medications for now.  Heart rate is currently in the 80s and 90s.  Hyperlipidemia -Continue statin  Constipation -Manually disimpacted 1/4 -On MiraLAX -Started on Linzess  Generalized weakness -Suspect that she may need skilled nursing facility placement -she is being considered for CIR versus SNF  DVT prophylaxis: SCDs Start: 08/04/21 0623  apixaban (ELIQUIS) tablet 5 mg  Code Status: Full code Family Communication: Discussed with patient Disposition Plan: Status is: Inpatient  Remains inpatient appropriate because: Need appropriate discharge plan      Consultants:  Cardiology Psychiatry  Procedures:    Antimicrobials:  Completed course of doxycycline   Subjective: Patient is lying in bed.  Denies any shortness of breath or pain.  She wants to work with physical therapy.  Objective: Vitals:   08/24/21 1900 08/24/21 1941 08/24/21 1945 08/24/21 2000  BP:    (!) 121/59  Pulse:    83  Resp:    15  Temp: (!) 97.4 F (36.3 C)     TempSrc: Oral     SpO2:  96% 100% 94%  Weight:      Height:        Intake/Output Summary (Last 24 hours) at 08/24/2021 2056 Last data filed at 08/24/2021 1700 Gross per 24 hour  Intake 303 ml  Output 1300 ml  Net -997 ml   Filed Weights   08/23/21 0500 08/23/21 1927 08/24/21 0341  Weight: 87 kg 88.8 kg 88.8 kg    Examination:  General exam: Alert, awake, oriented x 3 Respiratory system: Clear to auscultation. Respiratory effort normal. Cardiovascular system: Irregular. No murmurs, rubs, gallops. Gastrointestinal system: Abdomen is nondistended, soft and nontender. No organomegaly or masses felt. Normal bowel sounds heard. Central nervous system: Alert and oriented. No focal neurological deficits. Extremities: No C/C/E, +pedal pulses Skin: No rashes, lesions or ulcers Psychiatry:  Judgement and insight appear normal. Mood & affect appropriate.      Data Reviewed: I have personally reviewed following labs and imaging studies  CBC: Recent Labs  Lab 08/20/21 0417 08/21/21 0500 08/21/21 2020 08/22/21 0430 08/23/21 0545  WBC 10.9* 8.2 8.7 7.6 6.3  HGB 12.2 10.9* 12.2 11.3* 10.0*  HCT 37.5 33.9* 37.3 35.2* 31.5*  MCV 98.2 97.4 97.6 98.9 100.6*  PLT 225 163 167 149* 443*   Basic Metabolic Panel: Recent Labs  Lab 08/20/21 0417 08/21/21 0500 08/21/21 2020 08/22/21 0430 08/23/21 0545 08/24/21 0500  NA 136 133*  --  137 133* 137  K 3.7 4.1  --  4.3 4.0 4.9  CL 101 95*  --  99 97* 96*  CO2 29 30  --  27 32 30  GLUCOSE 170* 273*  --  133* 188* 254*  BUN 28* 24*  --  20 23 20   CREATININE 0.79 0.81 0.79 0.90 0.82 0.81  CALCIUM 8.8* 8.8*  --  9.0 8.8* 9.4  MG 2.1 2.2  --   --  2.3  --    GFR: Estimated Creatinine Clearance: 71.1 mL/min (by C-G formula based on SCr of 0.81 mg/dL). Liver Function Tests: No results for input(s): AST, ALT, ALKPHOS, BILITOT, PROT, ALBUMIN in the last 168 hours.  No results for input(s): LIPASE, AMYLASE  in the last 168 hours. No results for input(s): AMMONIA in the last 168 hours. Coagulation Profile: No results for input(s): INR, PROTIME in the last 168 hours. Cardiac Enzymes: No results for input(s): CKTOTAL, CKMB, CKMBINDEX, TROPONINI in the last 168 hours. BNP (last 3 results) No results for input(s): PROBNP in the last 8760 hours. HbA1C: No results for input(s): HGBA1C in the last 72 hours. CBG: Recent Labs  Lab 08/23/21 1612 08/23/21 2112 08/24/21 0759 08/24/21 1205 08/24/21 1615  GLUCAP 245* 217* 295* 227* 132*   Lipid Profile: No results for input(s): CHOL, HDL, LDLCALC, TRIG, CHOLHDL, LDLDIRECT in the last 72 hours. Thyroid Function Tests: Recent Labs    08/23/21 0545  TSH 0.736   Anemia Panel: No results for input(s): VITAMINB12, FOLATE, FERRITIN, TIBC, IRON, RETICCTPCT in the last 72  hours. Sepsis Labs: No results for input(s): PROCALCITON, LATICACIDVEN in the last 168 hours.   No results found for this or any previous visit (from the past 240 hour(s)).       Radiology Studies: No results found.      Scheduled Meds:  (feeding supplement) PROSource Plus  30 mL Oral BID BM   alteplase  2 mg Intracatheter Once   apixaban  5 mg Oral BID   arformoterol  15 mcg Nebulization BID   vitamin C  500 mg Oral Daily   azaTHIOprine  125 mg Oral Daily   budesonide  2 mL Nebulization BID   calcitRIOL  0.25 mcg Oral Q3 days   Chlorhexidine Gluconate Cloth  6 each Topical Daily   clopidogrel  75 mg Oral Daily   feeding supplement (GLUCERNA SHAKE)  237 mL Oral TID BM   fluticasone  2 spray Each Nare Daily   guaiFENesin-dextromethorphan  10 mL Oral QID   hydrocortisone  25 mg Rectal BID   insulin aspart  0-15 Units Subcutaneous TID WC   insulin aspart  0-5 Units Subcutaneous QHS   insulin aspart  8 Units Subcutaneous TID WC   insulin glargine-yfgn  20 Units Subcutaneous Daily   lamoTRIgine  25 mg Oral QHS   linaclotide  145 mcg Oral QAC breakfast   linagliptin  5 mg Oral Daily   loratadine  10 mg Oral Daily   magic mouthwash  5 mL Oral QID   mouth rinse  15 mL Mouth Rinse BID   montelukast  10 mg Oral QHS   multivitamin with minerals  1 tablet Oral Daily   nystatin   Topical TID   pantoprazole  40 mg Oral Daily   polyethylene glycol  17 g Oral TID   predniSONE  20 mg Oral Q breakfast   Ensure Max Protein  11 oz Oral Daily   rosuvastatin  20 mg Oral Daily   senna-docusate  2 tablet Oral BID   sertraline  100 mg Oral Daily   sodium chloride flush  10-40 mL Intracatheter Q12H   sodium chloride flush  3 mL Intravenous Q12H   zinc sulfate  220 mg Oral Daily   Continuous Infusions:  sodium chloride       LOS: 20 days    Time spent: 35 minutes    Kathie Dike, MD [facility]   If 7PM-7AM, please contact  night-coverage [email or web address]  08/24/2021, 8:56 PM

## 2021-08-24 NOTE — NC FL2 (Signed)
[place] LEVEL OF CARE SCREENING TOOL     IDENTIFICATION  Patient Name: Katherine [name] Birthdate: [birth year] Sex: female Admission Date (Current Location): 08/04/2021  [place] and [place] Number:  Katherine [name] [id number] [place] and Address:  [facility],  [address] [place], [place]      Provider Number: [id number]  Attending Physician Name and Address:  Kathie Dike, MD  Relative Name and Phone Number:  Gardiner Rhyme - Niece (# [phone])    Current Level of Care: Hospital Recommended Level of Care: [place] Prior Approval Number:    Date Approved/Denied:   PASRR Number: Pending  Discharge Plan: SNF    Current Diagnoses: Patient Active Problem List   Diagnosis Date Noted   MRSA pneumonia ([place]) 08/09/2021   Positive D dimer 08/09/2021   Chronic diastolic CHF (congestive heart failure) ([place]) 08/06/2021   COVID-19 virus infection 08/05/2021   Obesity (BMI 30-39.9) 08/05/2021   Acute on chronic respiratory failure with hypoxia ([place]) 08/04/2021   Pressure injury of skin 08/04/2021   Polymorphic ventricular tachycardia    Bradycardia    Acute encephalopathy    COPD (chronic obstructive pulmonary disease) ([place])    AKI (acute kidney injury) ([place])    History of renal transplant    Uncontrolled type 2 diabetes mellitus with hyperglycemia, with long-term current use of insulin ([place]) with neuropathy    Nausea & vomiting 04/19/2021   Acute kidney injury ([place]) 04/19/2021   Viral syndrome 03/21/2021   Pure hypercholesterolemia    Coronary stent restenosis due to scar tissue    Unstable angina ([place]) 02/19/2021   Acute CHF (congestive heart failure) ([place]) 01/29/2021   Non-ST elevation (NSTEMI) myocardial infarction [facility])    Chronic respiratory failure with hypoxia ([place]) 01/01/2021   Flu vaccine need 08/13/2020   Need for pneumococcal vaccination 08/13/2020   Visit for screening mammogram 08/13/2020   Candidal skin  infection 08/13/2020   GAD (generalized anxiety disorder) 08/13/2020   Chronic bronchitis, mucopurulent ([place]) 08/24/2019   Current chronic use of systemic steroids 07/23/2019   OSA (obstructive sleep apnea)    Severe episode of recurrent major depressive disorder, with psychotic features ([place]) 02/16/2019   Obstructive chronic bronchitis without exacerbation ([place]) 11/03/2018   Long term (current) use of antithrombotics/antiplatelets 04/28/2018   Squamous cell carcinoma of skin of right thumb 02/02/2018   Renal transplant, status post 01/28/2018   LBBB (left bundle branch block) 10/07/2017   Primary osteoarthritis of right knee 08/18/2017   Shortness of breath 06/18/2017   Ovarian cyst 05/18/2017   Pelvic mass in female 03/11/2017   Mild aortic stenosis by prior echocardiogram 01/28/2017   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   Deficiency anemia 10/14/2016   Normocytic anemia 10/13/2016   Fibromyalgia 03/30/2016   Other spondylosis with radiculopathy, lumbar region 03/30/2016   Type 2 diabetes mellitus with peripheral neuropathy ([place]) 03/30/2016   Paroxysmal atrial fibrillation ([place]); CHA2DS2VASc score F, HTN, CAD, CVA = 5 06/28/2014   Fluttering heart 06/21/2014   CAD S/P percutaneous coronary angioplasty - PCI x 5 to SVG-D1 09/11/2013   Essential hypertension    NSTEMI (non-ST elevated myocardial infarction) ([place]) 08/23/2013   PAD (peripheral artery disease) ([place]) 08/17/2013   CAD (coronary artery disease) of bypass graft 10/08/2012   Stenosis of right carotid artery without infarction 10/08/2012   Dyslipidemia, goal LDL below 70 10/08/2012   Mitral annular calcification 10/08/2012   Hypomagnesemia 06/13/2012   Renal transplant disorder 06/12/2012   Chronic allergic rhinitis 04/29/2011  Extrinsic asthma 09/09/2007   GERD 09/09/2007   COUGH, CHRONIC 09/09/2007   Morbid obesity - s/p Lap Band 9/'05 05/07/2004   Hx of CABG 09/07/1993   H/O ST elevation myocardial infarction (STEMI) of  inferoposterior wall 07/1993   Coronary artery disease involving native coronary artery of native heart with angina pectoris ([place]) 07/1993   ESRD (end stage renal disease) ([place]) 1991    Orientation RESPIRATION BLADDER Height & Weight     Self, Time, Situation, Place  O2 (3L) Continent Weight: 195 lb 12.3 oz (88.8 kg) Height:  5\' 1"  (154.9 cm)  BEHAVIORAL SYMPTOMS/MOOD NEUROLOGICAL BOWEL NUTRITION STATUS      Continent Diet (Cardiac)  AMBULATORY STATUS COMMUNICATION OF NEEDS Skin   Limited Assist Verbally PU Stage and Appropriate Care   PU Stage 2 Dressing:  (Every 3 days)                   Personal Care Assistance Level of Assistance  Bathing, Dressing, Feeding Bathing Assistance: Limited assistance Feeding assistance: Independent Dressing Assistance: Limited assistance     Functional Limitations Info  Sight, Speech, Hearing Sight Info: Adequate Hearing Info: Adequate Speech Info: Adequate    SPECIAL CARE FACTORS FREQUENCY  PT (By licensed PT), OT (By licensed OT)     PT Frequency: Minimum 5x a week OT Frequency: Minimum 5x a week            Contractures Contractures Info: Not present    Additional Factors Info  Code Status, Allergies, Insulin Sliding Scale, Psychotropic, Isolation Precautions Code Status Info: Full Code Allergies Info: Tetracycline   Niacin   Niaspan (Niacin Er)   Sulfa Antibiotics   Sulfonamide Derivatives   Codeine   Erythromycin   Hydromorphone Hcl   Morphine And Related   Nalbuphine   Sulfasalazine   Tape Psychotropic Info: sertraline (ZOLOFT) tablet 100 mg Insulin Sliding Scale Info: insulin aspart (novoLOG) injection 0-15 Units 3x a day with meals Isolation Precautions Info: MRSA     Current Medications (08/24/2021):  This is the current hospital active medication list Current Facility-Administered Medications  Medication Dose Route Frequency Provider Last Rate Last Admin   (feeding supplement) PROSource Plus liquid 30 mL  30 mL Oral  BID BM Belva Crome, MD   30 mL at 08/23/21 0958   0.9 %  sodium chloride infusion  250 mL Intravenous PRN Belva Crome, MD       acetaminophen (TYLENOL) tablet 650 mg  650 mg Oral Q6H PRN Belva Crome, MD   650 mg at 08/20/21 0423   Or   acetaminophen (TYLENOL) suppository 650 mg  650 mg Rectal Q6H PRN Belva Crome, MD       alteplase (CATHFLO ACTIVASE) injection 2 mg  2 mg Intracatheter Once Belva Crome, MD       apixaban Arne [place]) tablet 5 mg  5 mg Oral BID Dunn, Dayna N, PA-C   5 mg at 08/23/21 2100   arformoterol (BROVANA) nebulizer solution 15 mcg  15 mcg Nebulization BID Belva Crome, MD   15 mcg at 08/24/21 2122   ascorbic acid (VITAMIN C) tablet 500 mg  500 mg Oral Daily Belva Crome, MD   500 mg at 08/23/21 1014   azaTHIOprine (IMURAN) tablet 125 mg  125 mg Oral Daily Belva Crome, MD   125 mg at 08/24/21 0908   budesonide (PULMICORT) nebulizer solution 0.5 mg  2 mL Nebulization BID Belva Crome, MD  0.5 mg at 08/24/21 0809   calcitRIOL (ROCALTROL) capsule 0.25 mcg  0.25 mcg Oral Q3 days Belva Crome, MD   0.25 mcg at 08/23/21 0957   Chlorhexidine Gluconate Cloth 2 % PADS 6 each  6 each Topical Daily Belva Crome, MD   6 each at 08/22/21 1652   clopidogrel (PLAVIX) tablet 75 mg  75 mg Oral Daily Belva Crome, MD   75 mg at 08/23/21 1014   cyclobenzaprine (FLEXERIL) tablet 10 mg  10 mg Oral TID PRN Belva Crome, MD   10 mg at 08/16/21 2243   feeding supplement (GLUCERNA SHAKE) (GLUCERNA SHAKE) liquid 237 mL  237 mL Oral TID BM Belva Crome, MD   237 mL at 08/23/21 1948   fluticasone (FLONASE) 50 MCG/ACT nasal spray 2 spray  2 spray Each Nare Daily Belva Crome, MD   2 spray at 08/24/21 0911   guaiFENesin-dextromethorphan (ROBITUSSIN DM) 100-10 MG/5ML syrup 10 mL  10 mL Oral QID Belva Crome, MD   10 mL at 08/23/21 2100   hydrALAZINE (APRESOLINE) tablet 25 mg  25 mg Oral Q8H PRN Belva Crome, MD       hydrocortisone (ANUSOL-HC) suppository 25 mg  25 mg  Rectal BID Belva Crome, MD   25 mg at 08/23/21 2107   insulin aspart (novoLOG) injection 0-15 Units  0-15 Units Subcutaneous TID WC Belva Crome, MD   8 Units at 08/24/21 7026   insulin aspart (novoLOG) injection 0-5 Units  0-5 Units Subcutaneous QHS Belva Crome, MD   2 Units at 08/23/21 2116   insulin aspart (novoLOG) injection 8 Units  8 Units Subcutaneous TID WC Belva Crome, MD   8 Units at 08/24/21 0909   insulin glargine-yfgn (SEMGLEE) injection 20 Units  20 Units Subcutaneous Daily Memon, Jolaine Artist, MD       ipratropium-albuterol (DUONEB) 0.5-2.5 (3) MG/3ML nebulizer solution 3 mL  3 mL Nebulization Q4H PRN Belva Crome, MD   3 mL at 08/08/21 1711   lamoTRIgine (LAMICTAL) tablet 25 mg  25 mg Oral QHS Belva Crome, MD   25 mg at 08/23/21 2100   linaclotide (LINZESS) capsule 145 mcg  145 mcg Oral QAC breakfast Kathie Dike, MD   145 mcg at 08/24/21 0908   linagliptin (TRADJENTA) tablet 5 mg  5 mg Oral Daily Belva Crome, MD   5 mg at 08/23/21 1014   loratadine (CLARITIN) tablet 10 mg  10 mg Oral Daily Belva Crome, MD   10 mg at 08/23/21 1014   LORazepam (ATIVAN) tablet 0.5 mg  0.5 mg Oral BID PRN Belva Crome, MD   0.5 mg at 08/23/21 2101   magic mouthwash  5 mL Oral QID Belva Crome, MD   5 mL at 08/23/21 2100   MEDLINE mouth rinse  15 mL Mouth Rinse BID Belva Crome, MD   15 mL at 08/23/21 2105   menthol-cetylpyridinium (CEPACOL) lozenge 3 mg  1 lozenge Oral PRN Belva Crome, MD       montelukast (SINGULAIR) tablet 10 mg  10 mg Oral QHS Belva Crome, MD   10 mg at 08/23/21 2100   morphine 2 MG/ML injection 0.5 mg  0.5 mg Intravenous Q4H PRN Belva Crome, MD   0.5 mg at 08/22/21 1857   multivitamin with minerals tablet 1 tablet  1 tablet Oral Daily Belva Crome, MD   1 tablet at 08/23/21 1018  nitroGLYCERIN (NITROSTAT) SL tablet 0.4 mg  0.4 mg Sublingual Q5 min PRN Belva Crome, MD   0.4 mg at 08/19/21 8333   nystatin (MYCOSTATIN/NYSTOP) topical powder    Topical TID Belva Crome, MD   Given at 08/24/21 0911   ondansetron [facility]) injection 4 mg  4 mg Intravenous Q6H PRN Belva Crome, MD   4 mg at 08/23/21 2151   oxyCODONE (Oxy IR/ROXICODONE) immediate release tablet 10 mg  10 mg Oral Q6H PRN Belva Crome, MD   10 mg at 08/23/21 2057   pantoprazole (PROTONIX) EC tablet 40 mg  40 mg Oral Daily Belva Crome, MD   40 mg at 08/23/21 1013   polyethylene glycol (MIRALAX / GLYCOLAX) packet 17 g  17 g Oral TID Belva Crome, MD   17 g at 08/23/21 2104   predniSONE (DELTASONE) tablet 20 mg  20 mg Oral Q breakfast Belva Crome, MD   20 mg at 08/23/21 0958   promethazine (PHENERGAN) tablet 25 mg  25 mg Oral Q6H PRN Belva Crome, MD   25 mg at 08/18/21 1745   protein supplement (ENSURE MAX) liquid  11 oz Oral Daily Belva Crome, MD   11 oz at 08/22/21 1000   rosuvastatin (CRESTOR) tablet 20 mg  20 mg Oral Daily Belva Crome, MD   20 mg at 08/23/21 1014   senna-docusate (Senokot-S) tablet 2 tablet  2 tablet Oral BID Belva Crome, MD   2 tablet at 08/23/21 2100   sertraline (ZOLOFT) tablet 100 mg  100 mg Oral Daily Belva Crome, MD   100 mg at 08/23/21 1014   sodium chloride (OCEAN) 0.65 % nasal spray 1 spray  1 spray Each Nare PRN Belva Crome, MD   1 spray at 08/13/21 0150   sodium chloride flush (NS) 0.9 % injection 10-40 mL  10-40 mL Intracatheter Q12H Belva Crome, MD   10 mL at 08/23/21 2105   sodium chloride flush (NS) 0.9 % injection 10-40 mL  10-40 mL Intracatheter PRN Belva Crome, MD   20 mL at 08/18/21 1103   sodium chloride flush (NS) 0.9 % injection 3 mL  3 mL Intravenous Q12H Belva Crome, MD   3 mL at 08/23/21 2106   sodium chloride flush (NS) 0.9 % injection 3 mL  3 mL Intravenous PRN Belva Crome, MD       zinc sulfate capsule 220 mg  220 mg Oral Daily Belva Crome, MD   220 mg at 08/23/21 1015     Discharge Medications: Please see discharge summary for a list of discharge medications.  Relevant Imaging  Results:  Relevant Lab Results:   Additional Information SSN [id number]  Patient was Covid+ 08/04/21, off isolation  Sidonie Dexheimer, Jones Broom, LCSW

## 2021-08-24 NOTE — Progress Notes (Signed)
OT Cancellation Note  Patient Details Name: Katherine [name] MRN: [id number] DOB: 18-Mar-1956   Cancelled Treatment:    Reason Eval/Treat Not Completed: Other (comment) Patient declined to participate in therapy today with two attempts made one in AM and one in PM. On second attempt patient reported that she was going to "real therapy" tomorrow and needed a rest day today. OT to continue to follow and check back as schedule will allow. Jackelyn Poling OTR/L, [place] [facility] Department Office# [phone] Pager# [phone]   08/24/2021, 1:21 PM

## 2021-08-24 NOTE — TOC Progression Note (Signed)
Transition of Care [facility]) - Progression Note    Patient Details  Name: Katherine [name] MRN: [id number] Date of Birth: 21-Jun-1956  Transition of [facility]) CM/SW Contact  Ross Ludwig, [place] Phone Number: 08/24/2021, 6:55 PM  Clinical Narrative:    Patient has been faxed out to SNFs, and is a Level 2 [place] screen.  CIR also reviewing patient awaiting on decision for rehab.   Expected Discharge Plan: [place] Barriers to Discharge: Continued Medical Work up  Expected Discharge Plan and Services Expected Discharge Plan: [place]   Discharge Planning Services: CM Consult   Living arrangements for the past 2 months: Single Family Home                           HH Arranged: PT, OT [facility] Agency: [facility]) Date HH Agency Contacted: 08/20/21 Time [place]: 1307 Representative spoke with at [place]:  Ramond Marrow)   Social Determinants of Health ([place]) Interventions    Readmission Risk Interventions Readmission Risk Prevention Plan 05/14/2021 02/21/2021 01/31/2021  Transportation Screening Complete Complete Complete  HRI or [place] - - Complete  Social Work Consult for [place] Planning/Counseling - - Complete  Palliative Care Screening - - Not Applicable  Medication Review Press photographer) Complete Complete Complete  PCP or Specialist appointment within 3-5 days of discharge Complete Complete -  [place] or Home Care Consult Complete Complete -  SW Recovery Care/Counseling Consult Complete Complete -  Palliative Care Screening Not Applicable Not Applicable -  [place] Not Applicable Not Applicable -  Some recent data might be hidden

## 2021-08-24 NOTE — PMR Pre-admission (Signed)
PMR Admission Coordinator Pre-Admission Assessment  Patient: Katherine [name] is an 66 y.o., female MRN: [id number] DOB: 1956-07-15 Height: 5\' 1"  (154.9 cm) Weight: 88.8 kg  Insurance Information HMO:     PPO:      PCP:      IPA:      80/20: yes     OTHER:  PRIMARY: Medicare A B      Policy#: [id number]     Phone#: Verified online    Fax#:  Pre-Cert#:       Employer:  Benefits:  Phone #:      Name:  Eff. Date: Parts A ad B effective  04/17/88 Deduct: $1600 Out of Pocket Max:  None      Life Max: N/A  CIR: 100%      SNF: 100 days Outpatient: 80%     Co-Pay: 20% Home Health: 100%      Co-Pay: none DME: 80%     Co-Pay: 20% Providers: patient's choice  SECONDARY: Medicaid of [place]       Policy#: [id number]     Phone#:   Financial Counselor:       Phone#:   The Actuary for patients in Inpatient Rehabilitation Facilities with attached Privacy Act [place] [facility] Records was provided and verbally reviewed with: Patient  Emergency Contact Information Contact Information     Name Relation Home Work Mobile   [place],Sherry Niece [phone]         Current Medical History  Patient Admitting Diagnosis: COVID, Respiratory Failure, Debility   History of Present Illness: Pt. Is a 66 year old female with a history of CAD/CABG, chronic hypoxic respiratory failure, COPD, diabetes, kidney transplant, paroxysmal atrial fibrillation, admitted to [facility] 08/04/21 with generalized weakness and shortness of breath.  She was found to be hypoxic on her normal oxygen requirement.  Found to have COVID-19/MRSA pneumonia.  Treated with antibiotics, remdesivir and steroids. Admission chemistries were as follows: Urinalysis showed proteinuria 100 mg deciliter and small leukocyte esterase.  CBC showed a white count 5.4, hemoglobin 12.0 g deciliter platelets 197.  COVID PCR was positive.  CMP showed normal electrolytes when sodium corrected to glucose and calcium  to albumin.  Glucose 196 mg/dL and albumin 3.4 g/dL.  The rest of the CMP values were unremarkable.  BNP 189.6 pg mL.  Troponin was 43 and then 49 ng/L.  Magnesium was 1.5 mg/dL.  D-dimer, ferritin and CRP were elevated. She was noted to have NSTEMI.  Seen by cardiology with plans for cardiac cath.  Overall respiratory status has improved and appears to be approaching baseline.  She was noted to be hypotensive on 1/3 and started on Neo-Synephrine infusion.  Suspect that she may have an element of adrenal insufficiency, since her steroids are being tapered and she is chronically on prednisone.  She has since been weaned of vasopressors. Cardiac cath attempted on 1/5 but unsuccessful due to vascular issues CIR was consulted to assist return to PLOF.     Patient's medical record from [facility] has been reviewed by the rehabilitation admission coordinator and physician.  Past Medical History  Past Medical History:  Diagnosis Date   Anemia    Anxiety    Bilateral carotid artery stenosis    Carotid duplex 01/736: 1-06% LICA, 26-94% RICA, >85% RECA, f/u 1 yr suggested   CAD (coronary artery disease) of bypass graft 5/01; 3/'02, 8/'03, 10/'04; 1/15   PCI x 5 to SVG-D1    CAD in native  artery 07/1993   3 Vessel Disease (LAD-D1 & RCA) -- CABG (Dx in setting of inferior STEMI-PTCA of RCA)   CAD S/P percutaneous coronary angioplasty    PCI to SVG-D1 insertion/native D1 x 4 = '01 -(S660 BMS 2.5 x 9 anastomosis- D1); '02 - distal overlap ACS Pixel 2.5 x 8  BMS; '03 distal/native ISR/Thrombosis - Pixel 2.5 x 13; '04 - ISR-  Taxus 2.5 x 20 (covered all);; 1/15 - mid SVG-D1 (50% distal ISR) - Promus P 2.75 x 20 -- 2.8 mm; 6/22: Extensive ISR PTCA & Anastomotic-Native Diag DES PCI (Frontier Onyx 2.25x12 (2.75-2.5 mm post-dilation   COPD mixed type ([place])    Followed by Dr. Lamonte Sakai "pulmonologist said no COPD"   Depression with anxiety    Diabetes mellitus type 2 in obese ([place])    Diarrhea     started after cholecystectomy and mass removed from intestine   Dyslipidemia, goal LDL below 70    08/2012: TC 137, TG 200, HDL 32!, LDL 45; on statin (followed by Dr.Deterding)   ESRD (end stage renal disease) ([place]) 1991   s/p Cadaveric Renal Transplant ([facility] - Dr. Jimmy Footman)    Family history of adverse reaction to anesthesia    mom's bp dropped during/after anesthesia   Fibromyalgia    GERD (gastroesophageal reflux disease)    Glomerulonephritis, chronic, rapidly progressive 1989   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 07/1993   Rescue PTCA of RCA -- referred for CABG.   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  Had having having lots of nausea and vomiting with poor p.o. intake.  Thought to have QT prolongation with multiple medications involved and hypomagnesemia, hypokalemia.  Tikosyn discontinued along with Zoloft and Phenergan.   Hypertension associated with diabetes ([place])    Mild aortic stenosis by prior echocardiogram 07/2019   Echo:  Mild aortic stenosis (gradients: Mean 14.3 mmHg -peak 24.9 mmHg).   Morbid obesity ([facility])    MRSA (methicillin resistant staph aureus) culture positive    OSA (obstructive sleep apnea)    no longer on CPAP or home O2, states she doesn't need now after lap band   PAD (peripheral artery disease) ([place]) 08/2013   LEA Dopplers to be read by Dr. Fletcher Anon   PAF (paroxysmal atrial fibrillation) ([place]) 06/2014   Noted on CardioNet Monitor  - --> rhythm control with Tikosyn (Dr. Rayann Heman); converted from warfarin to apixaban for anticoagulation.   Pneumonia    Recurrent boils    Bilateral Groin   Rheumatoid arthritis ([place])    Per Patient Report; associated with  OA   S/p cadaver renal transplant 1991   [facility]   Torsades de pointes 05/12/2021   Unstable angina ([place]) 5/01; 3/'02, 8/'03, 10/'04; 1/15   x 5 occurences since Inf-Post STEMI in 1994    Has the patient had major surgery during 100 days prior to admission? Yes  Family History   family history includes Arrhythmia in her brother and paternal aunt; Cancer in her father and mother; Heart disease in her father.  Current Medications  Current Facility-Administered Medications:    (feeding supplement) PROSource Plus liquid 30 mL, 30  mL, Oral, BID BM, Belva Crome, MD, 30 mL at 08/23/21 0958   0.9 %  sodium chloride infusion, 250 mL, Intravenous, PRN, Belva Crome, MD   acetaminophen (TYLENOL) tablet 650 mg, 650 mg, Oral, Q6H PRN, 650 mg at 08/20/21 0423 **OR** acetaminophen (TYLENOL) suppository 650 mg, 650 mg, Rectal, Q6H PRN, Belva Crome, MD   alteplase (CATHFLO ACTIVASE) injection 2 mg, 2 mg, Intracatheter, Once, Belva Crome, MD   apixaban Arne [place]) tablet 5 mg, 5 mg, Oral, BID, Dunn, Dayna N, PA-C, 5 mg at 08/23/21 2100   arformoterol (BROVANA) nebulizer solution 15 mcg, 15 mcg, Nebulization, BID, Belva Crome, MD, 15 mcg at 08/24/21 0354   ascorbic acid (VITAMIN C) tablet 500 mg, 500 mg, Oral, Daily, Belva Crome, MD, 500 mg at 08/23/21 1014   azaTHIOprine (IMURAN) tablet 125 mg, 125 mg, Oral, Daily, Belva Crome, MD, 125 mg at 08/24/21 0908   budesonide (PULMICORT) nebulizer solution 0.5 mg, 2 mL, Nebulization, BID, Belva Crome, MD, 0.5 mg at 08/24/21 6568   calcitRIOL (ROCALTROL) capsule 0.25 mcg, 0.25 mcg, Oral, Q3 days, Belva Crome, MD, 0.25 mcg at 08/23/21 0957   Chlorhexidine Gluconate Cloth 2 % PADS 6 each, 6 each, Topical, Daily, Belva Crome, MD, 6 each at 08/22/21 1652   clopidogrel (PLAVIX) tablet 75 mg, 75 mg, Oral, Daily, Belva Crome, MD, 75 mg at 08/23/21 1014   cyclobenzaprine (FLEXERIL) tablet 10 mg, 10 mg, Oral, TID PRN, Belva Crome, MD, 10 mg at  08/16/21 2243   feeding supplement (GLUCERNA SHAKE) (GLUCERNA SHAKE) liquid 237 mL, 237 mL, Oral, TID BM, Belva Crome, MD, 237 mL at 08/23/21 1948   fluticasone (FLONASE) 50 MCG/ACT nasal spray 2 spray, 2 spray, Each Nare, Daily, Belva Crome, MD, 2 spray at 08/24/21 0911   guaiFENesin-dextromethorphan (ROBITUSSIN DM) 100-10 MG/5ML syrup 10 mL, 10 mL, Oral, QID, Belva Crome, MD, 10 mL at 08/23/21 2100   hydrALAZINE (APRESOLINE) tablet 25 mg, 25 mg, Oral, Q8H PRN, Belva Crome, MD   hydrocortisone (ANUSOL-HC) suppository 25 mg, 25 mg, Rectal, BID, Belva Crome, MD, 25 mg at 08/23/21 2107   insulin aspart (novoLOG) injection 0-15 Units, 0-15 Units, Subcutaneous, TID WC, Belva Crome, MD, 8 Units at 08/24/21 1275   insulin aspart (novoLOG) injection 0-5 Units, 0-5 Units, Subcutaneous, QHS, Belva Crome, MD, 2 Units at 08/23/21 2116   insulin aspart (novoLOG) injection 8 Units, 8 Units, Subcutaneous, TID WC, Belva Crome, MD, 8 Units at 08/24/21 0909   insulin glargine-yfgn (SEMGLEE) injection 20 Units, 20 Units, Subcutaneous, Daily, Memon, Jehanzeb, MD   ipratropium-albuterol (DUONEB) 0.5-2.5 (3) MG/3ML nebulizer solution 3 mL, 3 mL, Nebulization, Q4H PRN, Belva Crome, MD, 3 mL at 08/08/21 1711   lamoTRIgine (LAMICTAL) tablet 25 mg, 25 mg, Oral, QHS, Belva Crome, MD, 25 mg at 08/23/21 2100   linaclotide (LINZESS) capsule 145 mcg, 145 mcg, Oral, QAC breakfast, Kathie Dike, MD, 145 mcg at 08/24/21 0908   linagliptin (TRADJENTA) tablet 5 mg, 5 mg, Oral, Daily, Belva Crome, MD, 5 mg at 08/23/21 1014   loratadine (CLARITIN) tablet 10 mg, 10 mg, Oral, Daily, Belva Crome, MD, 10 mg at 08/23/21 1014   LORazepam (ATIVAN) tablet 0.5 mg, 0.5 mg, Oral, BID PRN, Belva Crome, MD, 0.5 mg at 08/23/21 2101   magic mouthwash, 5 mL, Oral, QID, Belva Crome, MD, 5 mL at 08/23/21 2100  MEDLINE mouth rinse, 15 mL, Mouth Rinse, BID, Belva Crome, MD, 15 mL at 08/23/21 2105    menthol-cetylpyridinium (CEPACOL) lozenge 3 mg, 1 lozenge, Oral, PRN, Belva Crome, MD   montelukast (SINGULAIR) tablet 10 mg, 10 mg, Oral, QHS, Belva Crome, MD, 10 mg at 08/23/21 2100   morphine 2 MG/ML injection 0.5 mg, 0.5 mg, Intravenous, Q4H PRN, Belva Crome, MD, 0.5 mg at 08/22/21 1857   multivitamin with minerals tablet 1 tablet, 1 tablet, Oral, Daily, Belva Crome, MD, 1 tablet at 08/23/21 1018   nitroGLYCERIN (NITROSTAT) SL tablet 0.4 mg, 0.4 mg, Sublingual, Q5 min PRN, Belva Crome, MD, 0.4 mg at 08/19/21 7124   nystatin (MYCOSTATIN/NYSTOP) topical powder, , Topical, TID, Belva Crome, MD, Given at 08/24/21 0911   ondansetron [facility]) injection 4 mg, 4 mg, Intravenous, Q6H PRN, Belva Crome, MD, 4 mg at 08/23/21 2151   oxyCODONE (Oxy IR/ROXICODONE) immediate release tablet 10 mg, 10 mg, Oral, Q6H PRN, Belva Crome, MD, 10 mg at 08/23/21 2057   pantoprazole (PROTONIX) EC tablet 40 mg, 40 mg, Oral, Daily, Belva Crome, MD, 40 mg at 08/23/21 1013   polyethylene glycol (MIRALAX / GLYCOLAX) packet 17 g, 17 g, Oral, TID, Belva Crome, MD, 17 g at 08/23/21 2104   predniSONE (DELTASONE) tablet 20 mg, 20 mg, Oral, Q breakfast, Belva Crome, MD, 20 mg at 08/23/21 0958   promethazine (PHENERGAN) tablet 25 mg, 25 mg, Oral, Q6H PRN, Belva Crome, MD, 25 mg at 08/18/21 1745   protein supplement (ENSURE MAX) liquid, 11 oz, Oral, Daily, Belva Crome, MD, 11 oz at 08/22/21 1000   rosuvastatin (CRESTOR) tablet 20 mg, 20 mg, Oral, Daily, Belva Crome, MD, 20 mg at 08/23/21 1014   senna-docusate (Senokot-S) tablet 2 tablet, 2 tablet, Oral, BID, Belva Crome, MD, 2 tablet at 08/23/21 2100   sertraline (ZOLOFT) tablet 100 mg, 100 mg, Oral, Daily, Belva Crome, MD, 100 mg at 08/23/21 1014   sodium chloride (OCEAN) 0.65 % nasal spray 1 spray, 1 spray, Each Nare, PRN, Belva Crome, MD, 1 spray at 08/13/21 0150   sodium chloride flush (NS) 0.9 % injection 10-40 mL, 10-40 mL,  Intracatheter, Q12H, Belva Crome, MD, 10 mL at 08/23/21 2105   sodium chloride flush (NS) 0.9 % injection 10-40 mL, 10-40 mL, Intracatheter, PRN, Belva Crome, MD, 20 mL at 08/18/21 1103   sodium chloride flush (NS) 0.9 % injection 3 mL, 3 mL, Intravenous, Q12H, Belva Crome, MD, 3 mL at 08/23/21 2106   sodium chloride flush (NS) 0.9 % injection 3 mL, 3 mL, Intravenous, PRN, Belva Crome, MD   zinc sulfate capsule 220 mg, 220 mg, Oral, Daily, Belva Crome, MD, 220 mg at 08/23/21 1015  Patients Current Diet:  Diet Order             Diet Carb Modified Fluid consistency: Thin; Room service appropriate? Yes  Diet effective now                   Precautions / Restrictions Precautions Precautions: Fall Precaution Comments: monitor sats/HR Restrictions Weight Bearing Restrictions: No   Has the patient had 2 or more falls or a fall with injury in the past year? Yes  Prior Activity Level Limited Community (1-2x/wk): Pt. went out for accasional appointments  Prior Functional Level Self Care: Did the patient need help bathing, dressing, using the toilet or  eating? Independent  Indoor Mobility: Did the patient need assistance with walking from room to room (with or without device)? Independent  Stairs: Did the patient need assistance with internal or external stairs (with or without device)? Needed some help  Functional Cognition: Did the patient need help planning regular tasks such as shopping or remembering to take medications? Needed some help  Patient Information Are you of Hispanic, Latino/a,or Spanish origin?: A. No, not of Hispanic, Latino/a, or Spanish origin What is your race?: A. White Do you need or want an interpreter to communicate with a doctor or health care staff?: 0. No  Patient's Response To:  Health Literacy and Transportation Is the patient able to respond to health literacy and transportation needs?: Yes Health Literacy - How often do you need to  have someone help you when you read instructions, pamphlets, or other written material from your doctor or pharmacy?: Sometimes In the past 12 months, has lack of transportation kept you from medical appointments or from getting medications?: Yes In the past 12 months, has lack of transportation kept you from meetings, work, or from getting things needed for daily living?: Yes  [place] / [place] Devices/Equipment: Oxygen Home Equipment: Rollator (4 wheels), Cane - single point, Tub bench, Grab bars - toilet, Wheelchair - manual  Prior Device Use: Indicate devices/aids used by the patient prior to current illness, exacerbation or injury? Walker  Current Functional Level Cognition  Overall Cognitive Status: Within Functional Limits for tasks assessed Orientation Level: Oriented X4 General Comments: ready to mobilize today    Extremity Assessment (includes Sensation/Coordination)  Upper Extremity Assessment: Overall WFL for tasks assessed  Lower Extremity Assessment: Generalized weakness LLE Deficits / Details: Limited flexion L knee - pt states this has been long term    ADLs  Overall ADL's : Needs assistance/impaired Eating/Feeding: Independent Grooming: Oral care, Wash/dry hands, Wash/dry face, Sitting, Set up Upper Body Bathing: Set up, Sitting Lower Body Bathing: Maximal assistance, Sitting/lateral leans Upper Body Dressing : Moderate assistance, Sitting Lower Body Dressing: Maximal assistance, +2 for safety/equipment, Sit to/from stand Toilet Transfer: Minimal assistance, +2 for safety/equipment, BSC/3in1, Stand-pivot Toileting- Clothing Manipulation and Hygiene: +2 for physical assistance, Total assistance, Sit to/from stand Functional mobility during ADLs: Minimal assistance, +2 for safety/equipment General ADL Comments: Patient required +2 to hand holds to stand and take steps to recliner. Slightly woblly in standing.    Mobility  Overal bed  mobility: Needs Assistance Bed Mobility: Rolling, Sidelying to Sit Rolling: Min assist Sidelying to sit: Min assist Supine to sit: HOB elevated, Mod assist, +2 for safety/equipment General bed mobility comments: Pt up in recliner and requests back to same    Transfers  Overall transfer level: Needs assistance Equipment used: Rolling walker (2 wheels) Transfers: Sit to/from Stand Sit to Stand: Min guard Bed to/from chair/wheelchair/BSC transfer type:: Step pivot Step pivot transfers: Min assist, Mod assist General transfer comment: Steady assist with cues for use of UEs    Ambulation / Gait / Stairs / Wheelchair Mobility  Ambulation/Gait Ambulation/Gait assistance: Min assist, Min guard, +2 safety/equipment Gait Distance (Feet): 45 Feet (and 35 and 25' with seated rest breaks) Assistive device: Rolling walker (2 wheels) Gait Pattern/deviations: Step-to pattern, Step-through pattern, Decreased step length - right, Decreased step length - left, Shuffle General Gait Details: step pvt bed to chair only - distance restricted by warmed high flow O2 Gait velocity: decr    Posture / Balance Balance Overall balance assessment: Needs assistance Sitting-balance  support: Feet supported, No upper extremity supported Sitting balance-Leahy Scale: Good Standing balance support: Bilateral upper extremity supported, Reliant on assistive device for balance, During functional activity Standing balance-Leahy Scale: Poor    Special needs/care consideration Oxygen 3L and Skin ecchymosis on bilateral arms, legs, and abdomen, cracking of bilateral heals, rash on breast and groin; Diabetic Management: Novolog 0-15 units sQ 3x a day and 0-5 units sQ daily at bedtime and Semglee 20 units sQ daily    Previous Home Environment (from acute therapy documentation) Living Arrangements: Other relatives Available Help at Discharge: Family, Available 24 hours/day Type of Home: House Home Layout: One level Home  Access: Stairs to enter Entrance Stairs-Rails: Left, Right Entrance Stairs-Number of Steps: 5 Bathroom Shower/Tub: Optometrist: Yes Home Care Services: No Additional Comments: wears 3L of supplemental O2 at home  Discharge Living Setting Plans for Discharge Living Setting: Patient's home Type of Home at Discharge: House Discharge Home Layout: One level Discharge Home Access: Stairs to enter Entrance Stairs-Rails: Can reach both Entrance Stairs-Number of Steps: 5 Discharge Bathroom Shower/Tub: Tub/shower unit Discharge Bathroom Toilet: Standard Discharge Bathroom Accessibility: Yes How Accessible: Accessible via walker Does the patient have any problems obtaining your medications?: No  Social/Family/Support Systems Patient Roles: Other (Comment) Contact Information: [phone] Anticipated Caregiver: Vic Ripper (neice) Anticipated Caregiver's Contact Information: [phone] Ability/Limitations of Caregiver: Saddie Benders can provide intermittent support, Pt. lives with brother, who is disabled and can provide supervision 24/7, friend also lives with pt. and provides min A Caregiver Availability: 24/7 Discharge Plan Discussed with Primary Caregiver: Yes Is Caregiver In Agreement with Plan?: Yes Does Caregiver/Family have Issues with Lodging/Transportation while Pt is in Rehab?: No  Goals Patient/Family Goal for Rehab: PT/OT Supervision Expected length of stay: 5-7 days Pt/Family Agrees to Admission and willing to participate: Yes Program Orientation Provided & Reviewed with Pt/Caregiver Including Roles  & Responsibilities: Yes  Decrease burden of Care through IP rehab admission: Patient/family education  Possible need for SNF placement upon discharge: not anticipated   Patient Condition: I have reviewed medical records from [facility]  , spoken with CM, and patient and family member. I discussed via phone for  inpatient rehabilitation assessment.  Patient will benefit from ongoing PT and OT, can actively participate in 3 hours of therapy a day 5 days of the week, and can make measurable gains during the admission.  Patient will also benefit from the coordinated team approach during an Inpatient Acute Rehabilitation admission.  The patient will receive intensive therapy as well as Rehabilitation physician, nursing, social worker, and care management interventions.  Due to safety, disease management, medication administration, pain management, and patient education the patient requires 24 hour a day rehabilitation nursing.  The patient is currently  with mobility and basic ADLs.  Discharge setting and therapy post discharge at home with home health is anticipated.  Patient has agreed to participate in the Acute Inpatient Rehabilitation Program and will admit today.  Preadmission Screen Completed By:  Genella Mech, 08/24/2021 11:07 AM ______________________________________________________________________   Discussed status with Dr. Naaman Plummer on 08/25/20 at 60  and received approval for admission today.  Admission Coordinator:  Genella Mech, CCC-SLP, time 1040/Date 08/25/20   Assessment/Plan: Diagnosis:Debility post Covid PNA Does the need for close, 24 hr/day Medical supervision in concert with the patient's rehab needs make it unreasonable for this patient to be served in a less intensive setting? Yes Co-Morbidities requiring supervision/potential complications: COPD, PAF, Hx Kidney transplant ,  diabetes Due to bladder management, bowel management, safety, skin/wound care, disease management, medication administration, pain management, and patient education, does the patient require 24 hr/day rehab nursing? Yes Does the patient require coordinated care of a physician, rehab nurse, PT, OT, and SLP to address physical and functional deficits in the context of the above medical diagnosis(es)? Yes Addressing deficits  in the following areas: balance, endurance, locomotion, strength, transferring, bowel/bladder control, bathing, dressing, feeding, grooming, toileting, and psychosocial support Can the patient actively participate in an intensive therapy program of at least 3 hrs of therapy 5 days a week? Yes The potential for patient to make measurable gains while on inpatient rehab is good Anticipated functional outcomes upon discharge from inpatient rehab: supervision PT, supervision OT, n/a SLP Estimated rehab length of stay to reach the above functional goals is: 5-7d Anticipated discharge destination: Home 10. Overall Rehab/Functional Prognosis: good   MD Signature: Charlett Blake M.D. [place] [facility] Fellow Am Acad of Phys Med and Rehab Diplomate Am Board of Electrodiagnostic Med Fellow Am Board of Interventional Pain

## 2021-08-24 NOTE — Plan of Care (Signed)
°  Problem: Education: Goal: Knowledge of General Education information will improve Description: Including pain rating scale, medication(s)/side effects and non-pharmacologic comfort measures Outcome: Progressing   Problem: Health Behavior/Discharge Planning: Goal: Ability to manage health-related needs will improve Outcome: Progressing   Problem: Clinical Measurements: Goal: Ability to maintain clinical measurements within normal limits will improve Outcome: Progressing Goal: Respiratory complications will improve Outcome: Progressing   Problem: Clinical Measurements: Goal: Cardiovascular complication will be avoided Outcome: Not Progressing   Problem: Skin Integrity: Goal: Risk for impaired skin integrity will decrease Outcome: Not Progressing Note: Pt refusing turns/off-loading. Educated pt on need for frequent repositioning due to pressure sores on sacrum.

## 2021-08-25 ENCOUNTER — Inpatient Hospital Stay (HOSPITAL_COMMUNITY)
Admission: RE | Admit: 2021-08-25 | Discharge: 2021-09-02 | DRG: 945 | Disposition: A | Discharge status: Other Acute Inpatient Hospital | Payer: Medicare Other | Source: Intra-hospital | Attending: Physical Medicine & Rehabilitation | Admitting: Physical Medicine & Rehabilitation

## 2021-08-25 ENCOUNTER — Encounter (HOSPITAL_COMMUNITY): Payer: Self-pay | Admitting: Physical Medicine & Rehabilitation

## 2021-08-25 ENCOUNTER — Other Ambulatory Visit: Payer: Self-pay

## 2021-08-25 DIAGNOSIS — I110 Hypertensive heart disease with heart failure: Secondary | ICD-10-CM | POA: Diagnosis present

## 2021-08-25 DIAGNOSIS — E876 Hypokalemia: Secondary | ICD-10-CM | POA: Diagnosis not present

## 2021-08-25 DIAGNOSIS — E1169 Type 2 diabetes mellitus with other specified complication: Secondary | ICD-10-CM | POA: Diagnosis not present

## 2021-08-25 DIAGNOSIS — I2510 Atherosclerotic heart disease of native coronary artery without angina pectoris: Secondary | ICD-10-CM | POA: Diagnosis not present

## 2021-08-25 DIAGNOSIS — J449 Chronic obstructive pulmonary disease, unspecified: Secondary | ICD-10-CM | POA: Diagnosis present

## 2021-08-25 DIAGNOSIS — Z7901 Long term (current) use of anticoagulants: Secondary | ICD-10-CM

## 2021-08-25 DIAGNOSIS — Z951 Presence of aortocoronary bypass graft: Secondary | ICD-10-CM

## 2021-08-25 DIAGNOSIS — Z794 Long term (current) use of insulin: Secondary | ICD-10-CM

## 2021-08-25 DIAGNOSIS — I70229 Atherosclerosis of native arteries of extremities with rest pain, unspecified extremity: Secondary | ICD-10-CM | POA: Diagnosis present

## 2021-08-25 DIAGNOSIS — Z01810 Encounter for preprocedural cardiovascular examination: Secondary | ICD-10-CM | POA: Diagnosis not present

## 2021-08-25 DIAGNOSIS — I25119 Atherosclerotic heart disease of native coronary artery with unspecified angina pectoris: Secondary | ICD-10-CM | POA: Diagnosis present

## 2021-08-25 DIAGNOSIS — K5903 Drug induced constipation: Secondary | ICD-10-CM | POA: Diagnosis present

## 2021-08-25 DIAGNOSIS — Z9884 Bariatric surgery status: Secondary | ICD-10-CM

## 2021-08-25 DIAGNOSIS — Z8673 Personal history of transient ischemic attack (TIA), and cerebral infarction without residual deficits: Secondary | ICD-10-CM | POA: Diagnosis not present

## 2021-08-25 DIAGNOSIS — G8929 Other chronic pain: Secondary | ICD-10-CM | POA: Diagnosis not present

## 2021-08-25 DIAGNOSIS — N186 End stage renal disease: Secondary | ICD-10-CM | POA: Diagnosis not present

## 2021-08-25 DIAGNOSIS — I200 Unstable angina: Secondary | ICD-10-CM | POA: Diagnosis present

## 2021-08-25 DIAGNOSIS — I4891 Unspecified atrial fibrillation: Secondary | ICD-10-CM | POA: Diagnosis not present

## 2021-08-25 DIAGNOSIS — Z885 Allergy status to narcotic agent status: Secondary | ICD-10-CM

## 2021-08-25 DIAGNOSIS — F17210 Nicotine dependence, cigarettes, uncomplicated: Secondary | ICD-10-CM | POA: Diagnosis present

## 2021-08-25 DIAGNOSIS — I739 Peripheral vascular disease, unspecified: Secondary | ICD-10-CM

## 2021-08-25 DIAGNOSIS — E1142 Type 2 diabetes mellitus with diabetic polyneuropathy: Secondary | ICD-10-CM | POA: Diagnosis present

## 2021-08-25 DIAGNOSIS — I483 Typical atrial flutter: Secondary | ICD-10-CM | POA: Diagnosis not present

## 2021-08-25 DIAGNOSIS — Z87442 Personal history of urinary calculi: Secondary | ICD-10-CM

## 2021-08-25 DIAGNOSIS — Z881 Allergy status to other antibiotic agents status: Secondary | ICD-10-CM

## 2021-08-25 DIAGNOSIS — Z79624 Long term (current) use of inhibitors of nucleotide synthesis: Secondary | ICD-10-CM

## 2021-08-25 DIAGNOSIS — E785 Hyperlipidemia, unspecified: Secondary | ICD-10-CM | POA: Diagnosis present

## 2021-08-25 DIAGNOSIS — Z8249 Family history of ischemic heart disease and other diseases of the circulatory system: Secondary | ICD-10-CM

## 2021-08-25 DIAGNOSIS — Z9981 Dependence on supplemental oxygen: Secondary | ICD-10-CM

## 2021-08-25 DIAGNOSIS — J811 Chronic pulmonary edema: Secondary | ICD-10-CM | POA: Diagnosis not present

## 2021-08-25 DIAGNOSIS — I5043 Acute on chronic combined systolic (congestive) and diastolic (congestive) heart failure: Secondary | ICD-10-CM | POA: Diagnosis not present

## 2021-08-25 DIAGNOSIS — I4892 Unspecified atrial flutter: Secondary | ICD-10-CM | POA: Diagnosis present

## 2021-08-25 DIAGNOSIS — Z79899 Other long term (current) drug therapy: Secondary | ICD-10-CM

## 2021-08-25 DIAGNOSIS — J9621 Acute and chronic respiratory failure with hypoxia: Secondary | ICD-10-CM | POA: Diagnosis not present

## 2021-08-25 DIAGNOSIS — Z7952 Long term (current) use of systemic steroids: Secondary | ICD-10-CM

## 2021-08-25 DIAGNOSIS — M4316 Spondylolisthesis, lumbar region: Secondary | ICD-10-CM | POA: Diagnosis not present

## 2021-08-25 DIAGNOSIS — I25110 Atherosclerotic heart disease of native coronary artery with unstable angina pectoris: Secondary | ICD-10-CM | POA: Diagnosis present

## 2021-08-25 DIAGNOSIS — I70221 Atherosclerosis of native arteries of extremities with rest pain, right leg: Secondary | ICD-10-CM | POA: Diagnosis present

## 2021-08-25 DIAGNOSIS — G894 Chronic pain syndrome: Secondary | ICD-10-CM | POA: Diagnosis present

## 2021-08-25 DIAGNOSIS — I70223 Atherosclerosis of native arteries of extremities with rest pain, bilateral legs: Secondary | ICD-10-CM | POA: Diagnosis not present

## 2021-08-25 DIAGNOSIS — I10 Essential (primary) hypertension: Secondary | ICD-10-CM | POA: Diagnosis not present

## 2021-08-25 DIAGNOSIS — J810 Acute pulmonary edema: Secondary | ICD-10-CM | POA: Diagnosis not present

## 2021-08-25 DIAGNOSIS — Z940 Kidney transplant status: Secondary | ICD-10-CM

## 2021-08-25 DIAGNOSIS — Z955 Presence of coronary angioplasty implant and graft: Secondary | ICD-10-CM | POA: Diagnosis not present

## 2021-08-25 DIAGNOSIS — F32A Depression, unspecified: Secondary | ICD-10-CM | POA: Diagnosis present

## 2021-08-25 DIAGNOSIS — Z515 Encounter for palliative care: Secondary | ICD-10-CM | POA: Diagnosis not present

## 2021-08-25 DIAGNOSIS — Z7951 Long term (current) use of inhaled steroids: Secondary | ICD-10-CM

## 2021-08-25 DIAGNOSIS — M79605 Pain in left leg: Secondary | ICD-10-CM | POA: Diagnosis not present

## 2021-08-25 DIAGNOSIS — I5032 Chronic diastolic (congestive) heart failure: Secondary | ICD-10-CM | POA: Diagnosis present

## 2021-08-25 DIAGNOSIS — F419 Anxiety disorder, unspecified: Secondary | ICD-10-CM | POA: Diagnosis present

## 2021-08-25 DIAGNOSIS — I70263 Atherosclerosis of native arteries of extremities with gangrene, bilateral legs: Secondary | ICD-10-CM | POA: Diagnosis not present

## 2021-08-25 DIAGNOSIS — Z800 Family history of malignant neoplasm of digestive organs: Secondary | ICD-10-CM | POA: Diagnosis not present

## 2021-08-25 DIAGNOSIS — I214 Non-ST elevation (NSTEMI) myocardial infarction: Secondary | ICD-10-CM | POA: Diagnosis present

## 2021-08-25 DIAGNOSIS — I509 Heart failure, unspecified: Secondary | ICD-10-CM | POA: Diagnosis not present

## 2021-08-25 DIAGNOSIS — R0602 Shortness of breath: Secondary | ICD-10-CM

## 2021-08-25 DIAGNOSIS — E1151 Type 2 diabetes mellitus with diabetic peripheral angiopathy without gangrene: Secondary | ICD-10-CM | POA: Diagnosis present

## 2021-08-25 DIAGNOSIS — I209 Angina pectoris, unspecified: Secondary | ICD-10-CM | POA: Diagnosis not present

## 2021-08-25 DIAGNOSIS — Z8614 Personal history of Methicillin resistant Staphylococcus aureus infection: Secondary | ICD-10-CM

## 2021-08-25 DIAGNOSIS — I4811 Longstanding persistent atrial fibrillation: Secondary | ICD-10-CM | POA: Diagnosis present

## 2021-08-25 DIAGNOSIS — Z5181 Encounter for therapeutic drug level monitoring: Secondary | ICD-10-CM | POA: Diagnosis not present

## 2021-08-25 DIAGNOSIS — Z66 Do not resuscitate: Secondary | ICD-10-CM | POA: Diagnosis not present

## 2021-08-25 DIAGNOSIS — U099 Post covid-19 condition, unspecified: Secondary | ICD-10-CM | POA: Diagnosis present

## 2021-08-25 DIAGNOSIS — E669 Obesity, unspecified: Secondary | ICD-10-CM | POA: Diagnosis not present

## 2021-08-25 DIAGNOSIS — M79604 Pain in right leg: Secondary | ICD-10-CM | POA: Diagnosis not present

## 2021-08-25 DIAGNOSIS — K219 Gastro-esophageal reflux disease without esophagitis: Secondary | ICD-10-CM | POA: Diagnosis present

## 2021-08-25 DIAGNOSIS — J129 Viral pneumonia, unspecified: Secondary | ICD-10-CM | POA: Insufficient documentation

## 2021-08-25 DIAGNOSIS — Z888 Allergy status to other drugs, medicaments and biological substances status: Secondary | ICD-10-CM

## 2021-08-25 DIAGNOSIS — E11649 Type 2 diabetes mellitus with hypoglycemia without coma: Secondary | ICD-10-CM | POA: Diagnosis not present

## 2021-08-25 DIAGNOSIS — T402X5A Adverse effect of other opioids, initial encounter: Secondary | ICD-10-CM | POA: Diagnosis present

## 2021-08-25 DIAGNOSIS — R52 Pain, unspecified: Secondary | ICD-10-CM

## 2021-08-25 DIAGNOSIS — E119 Type 2 diabetes mellitus without complications: Secondary | ICD-10-CM | POA: Diagnosis not present

## 2021-08-25 DIAGNOSIS — G4733 Obstructive sleep apnea (adult) (pediatric): Secondary | ICD-10-CM | POA: Diagnosis present

## 2021-08-25 DIAGNOSIS — E1140 Type 2 diabetes mellitus with diabetic neuropathy, unspecified: Secondary | ICD-10-CM | POA: Diagnosis not present

## 2021-08-25 DIAGNOSIS — Z882 Allergy status to sulfonamides status: Secondary | ICD-10-CM

## 2021-08-25 DIAGNOSIS — U071 COVID-19: Secondary | ICD-10-CM | POA: Diagnosis not present

## 2021-08-25 DIAGNOSIS — R5381 Other malaise: Principal | ICD-10-CM | POA: Diagnosis present

## 2021-08-25 DIAGNOSIS — M25561 Pain in right knee: Secondary | ICD-10-CM | POA: Diagnosis not present

## 2021-08-25 DIAGNOSIS — Z7984 Long term (current) use of oral hypoglycemic drugs: Secondary | ICD-10-CM

## 2021-08-25 LAB — GLUCOSE, CAPILLARY
Glucose-Capillary: 306 mg/dL — ABNORMAL HIGH (ref 70–99)
Glucose-Capillary: 447 mg/dL — ABNORMAL HIGH (ref 70–99)
Glucose-Capillary: 298 mg/dL — ABNORMAL HIGH (ref 70–99)

## 2021-08-25 LAB — GLUCOSE, RANDOM: Glucose, Bld: 413 mg/dL — ABNORMAL HIGH (ref 70–99)

## 2021-08-25 MED ORDER — CALCITRIOL 0.25 MCG PO CAPS
0.2500 ug | ORAL_CAPSULE | ORAL | Status: DC
  Administered 2021-08-26 – 2021-09-01 (×3): 0.25 ug via ORAL
  Filled 2021-08-25 (×3): qty 1

## 2021-08-25 MED ORDER — METOPROLOL SUCCINATE ER 25 MG PO TB24
12.5000 mg | ORAL_TABLET | Freq: Every day | ORAL | Status: DC
  Administered 2021-08-25: 12.5 mg via ORAL
  Filled 2021-08-25: qty 1

## 2021-08-25 MED ORDER — PREDNISONE 10 MG PO TABS: ORAL_TABLET | ORAL | Status: DC

## 2021-08-25 MED ORDER — CYCLOBENZAPRINE HCL 10 MG PO TABS: 10.0000 mg | ORAL_TABLET | Freq: Three times a day (TID) | ORAL | 2 refills | Status: DC | PRN

## 2021-08-25 MED ORDER — BUDESONIDE 0.5 MG/2ML IN SUSP
2.0000 mL | Freq: Two times a day (BID) | RESPIRATORY_TRACT | Status: DC
  Administered 2021-08-25 – 2021-09-02 (×16): 0.5 mg via RESPIRATORY_TRACT
  Filled 2021-08-25 (×16): qty 2

## 2021-08-25 MED ORDER — PROMETHAZINE HCL 25 MG PO TABS: 25.0000 mg | ORAL_TABLET | Freq: Four times a day (QID) | ORAL | Status: DC | PRN

## 2021-08-25 MED ORDER — PROCHLORPERAZINE EDISYLATE 10 MG/2ML IJ SOLN: 5.0000 mg | Freq: Four times a day (QID) | INTRAMUSCULAR | Status: DC | PRN

## 2021-08-25 MED ORDER — METFORMIN HCL 500 MG PO TABS
500.0000 mg | ORAL_TABLET | Freq: Two times a day (BID) | ORAL | Status: DC
  Administered 2021-08-25 – 2021-08-29 (×8): 500 mg via ORAL
  Filled 2021-08-25 (×8): qty 1

## 2021-08-25 MED ORDER — MORPHINE SULFATE (PF) 2 MG/ML IV SOLN: 0.5000 mg | INTRAVENOUS | Status: DC | PRN

## 2021-08-25 MED ORDER — POLYETHYLENE GLYCOL 3350 17 G PO PACK: 17.0000 g | PACK | Freq: Every day | ORAL | Status: DC | PRN

## 2021-08-25 MED ORDER — BASAGLAR KWIKPEN 100 UNIT/ML ~~LOC~~ SOPN: 20.0000 [IU] | PEN_INJECTOR | Freq: Every day | SUBCUTANEOUS | Status: DC

## 2021-08-25 MED ORDER — CYCLOBENZAPRINE HCL 10 MG PO TABS
10.0000 mg | ORAL_TABLET | Freq: Three times a day (TID) | ORAL | Status: DC | PRN
  Administered 2021-08-28 – 2021-09-02 (×7): 10 mg via ORAL
  Filled 2021-08-25 (×7): qty 1

## 2021-08-25 MED ORDER — ALUM & MAG HYDROXIDE-SIMETH 200-200-20 MG/5ML PO SUSP: 30.0000 mL | ORAL | Status: DC | PRN

## 2021-08-25 MED ORDER — ARFORMOTEROL TARTRATE 15 MCG/2ML IN NEBU
15.0000 ug | INHALATION_SOLUTION | Freq: Two times a day (BID) | RESPIRATORY_TRACT | Status: DC
  Administered 2021-08-25 – 2021-09-02 (×16): 15 ug via RESPIRATORY_TRACT
  Filled 2021-08-25 (×16): qty 2

## 2021-08-25 MED ORDER — ROSUVASTATIN CALCIUM 20 MG PO TABS
20.0000 mg | ORAL_TABLET | Freq: Every day | ORAL | Status: DC
  Administered 2021-08-26 – 2021-09-02 (×8): 20 mg via ORAL
  Filled 2021-08-25 (×8): qty 1

## 2021-08-25 MED ORDER — PROCHLORPERAZINE 25 MG RE SUPP: 12.5000 mg | Freq: Four times a day (QID) | RECTAL | Status: DC | PRN

## 2021-08-25 MED ORDER — ACETAMINOPHEN 325 MG PO TABS
325.0000 mg | ORAL_TABLET | ORAL | Status: DC | PRN
  Administered 2021-08-27 – 2021-08-30 (×4): 650 mg via ORAL
  Filled 2021-08-25 (×4): qty 2

## 2021-08-25 MED ORDER — DIPHENHYDRAMINE HCL 12.5 MG/5ML PO ELIX: 12.5000 mg | ORAL_SOLUTION | Freq: Four times a day (QID) | ORAL | Status: DC | PRN

## 2021-08-25 MED ORDER — SODIUM CHLORIDE 0.9% FLUSH
10.0000 mL | Freq: Two times a day (BID) | INTRAVENOUS | Status: DC
  Administered 2021-08-25: 20 mL
  Administered 2021-08-26 – 2021-08-30 (×8): 10 mL
  Administered 2021-08-31: 20 mL
  Administered 2021-09-01 – 2021-09-02 (×4): 10 mL

## 2021-08-25 MED ORDER — LAMOTRIGINE 25 MG PO TABS
25.0000 mg | ORAL_TABLET | Freq: Every day | ORAL | Status: DC
  Administered 2021-08-25 – 2021-09-01 (×8): 25 mg via ORAL
  Filled 2021-08-25 (×10): qty 1

## 2021-08-25 MED ORDER — PANTOPRAZOLE SODIUM 40 MG PO TBEC
40.0000 mg | DELAYED_RELEASE_TABLET | Freq: Every day | ORAL | Status: DC
  Administered 2021-08-26 – 2021-09-02 (×8): 40 mg via ORAL
  Filled 2021-08-25 (×8): qty 1

## 2021-08-25 MED ORDER — LINAGLIPTIN 5 MG PO TABS
5.0000 mg | ORAL_TABLET | Freq: Every day | ORAL | Status: DC
  Administered 2021-08-26 – 2021-09-02 (×8): 5 mg via ORAL
  Filled 2021-08-25 (×10): qty 1

## 2021-08-25 MED ORDER — METOPROLOL SUCCINATE ER 25 MG PO TB24: 12.5000 mg | ORAL_TABLET | Freq: Every day | ORAL | 11 refills | Status: DC

## 2021-08-25 MED ORDER — SALINE SPRAY 0.65 % NA SOLN
1.0000 | NASAL | Status: DC | PRN
  Filled 2021-08-25: qty 44

## 2021-08-25 MED ORDER — OXYCODONE HCL 5 MG PO TABS
10.0000 mg | ORAL_TABLET | Freq: Four times a day (QID) | ORAL | Status: DC | PRN
  Administered 2021-08-25 – 2021-09-02 (×21): 10 mg via ORAL
  Filled 2021-08-25 (×24): qty 2

## 2021-08-25 MED ORDER — INSULIN GLARGINE-YFGN 100 UNIT/ML ~~LOC~~ SOLN
20.0000 [IU] | Freq: Every day | SUBCUTANEOUS | Status: DC
  Administered 2021-08-26 – 2021-08-27 (×2): 20 [IU] via SUBCUTANEOUS
  Filled 2021-08-25 (×2): qty 0.2

## 2021-08-25 MED ORDER — ASCORBIC ACID 500 MG PO TABS
500.0000 mg | ORAL_TABLET | Freq: Every day | ORAL | Status: DC
  Administered 2021-08-26 – 2021-09-02 (×8): 500 mg via ORAL
  Filled 2021-08-25 (×8): qty 1

## 2021-08-25 MED ORDER — LINACLOTIDE 145 MCG PO CAPS: 145.0000 ug | ORAL_CAPSULE | Freq: Every day | ORAL | Status: DC

## 2021-08-25 MED ORDER — PROSOURCE PLUS PO LIQD
30.0000 mL | Freq: Two times a day (BID) | ORAL | Status: DC
  Administered 2021-08-25 – 2021-09-02 (×9): 30 mL via ORAL
  Filled 2021-08-25 (×14): qty 30

## 2021-08-25 MED ORDER — BISACODYL 10 MG RE SUPP
10.0000 mg | Freq: Every day | RECTAL | Status: DC | PRN
  Administered 2021-08-25 – 2021-08-31 (×2): 10 mg via RECTAL
  Filled 2021-08-25 (×2): qty 1

## 2021-08-25 MED ORDER — MENTHOL 3 MG MT LOZG: 1.0000 | LOZENGE | OROMUCOSAL | Status: DC | PRN

## 2021-08-25 MED ORDER — LINACLOTIDE 145 MCG PO CAPS
145.0000 ug | ORAL_CAPSULE | Freq: Every day | ORAL | Status: DC
  Administered 2021-08-26 – 2021-09-02 (×8): 145 ug via ORAL
  Filled 2021-08-25 (×10): qty 1

## 2021-08-25 MED ORDER — NYSTATIN 100000 UNIT/GM EX POWD
Freq: Three times a day (TID) | CUTANEOUS | Status: DC
  Filled 2021-08-25: qty 15

## 2021-08-25 MED ORDER — LORATADINE 10 MG PO TABS
10.0000 mg | ORAL_TABLET | Freq: Every day | ORAL | Status: DC
  Administered 2021-08-26 – 2021-09-02 (×8): 10 mg via ORAL
  Filled 2021-08-25 (×8): qty 1

## 2021-08-25 MED ORDER — HYDROCORTISONE ACETATE 25 MG RE SUPP
25.0000 mg | Freq: Two times a day (BID) | RECTAL | Status: DC
Start: 2021-08-25 — End: 2021-09-02
  Administered 2021-08-25 – 2021-09-02 (×14): 25 mg via RECTAL
  Filled 2021-08-25 (×16): qty 1

## 2021-08-25 MED ORDER — MAGIC MOUTHWASH
5.0000 mL | Freq: Four times a day (QID) | ORAL | Status: DC
  Administered 2021-08-25 – 2021-09-02 (×32): 5 mL via ORAL
  Filled 2021-08-25 (×35): qty 5

## 2021-08-25 MED ORDER — INSULIN ASPART 100 UNIT/ML IJ SOLN
8.0000 [IU] | Freq: Three times a day (TID) | INTRAMUSCULAR | Status: DC
Start: 2021-08-25 — End: 2021-09-02
  Administered 2021-08-25 – 2021-09-01 (×20): 8 [IU] via SUBCUTANEOUS

## 2021-08-25 MED ORDER — MELATONIN 5 MG PO TABS
5.0000 mg | ORAL_TABLET | Freq: Once | ORAL | Status: AC
  Administered 2021-08-25: 5 mg via ORAL
  Filled 2021-08-25: qty 1

## 2021-08-25 MED ORDER — POLYETHYLENE GLYCOL 3350 17 G PO PACK: 17.0000 g | PACK | Freq: Two times a day (BID) | ORAL | 0 refills | Status: DC

## 2021-08-25 MED ORDER — FLUTICASONE PROPIONATE 50 MCG/ACT NA SUSP
2.0000 | Freq: Every day | NASAL | Status: DC
  Administered 2021-08-26 – 2021-09-02 (×8): 2 via NASAL
  Filled 2021-08-25: qty 16

## 2021-08-25 MED ORDER — MELATONIN 5 MG PO TABS
5.0000 mg | ORAL_TABLET | Freq: Every evening | ORAL | Status: DC | PRN
  Administered 2021-08-28 – 2021-09-01 (×4): 5 mg via ORAL
  Filled 2021-08-25 (×4): qty 1

## 2021-08-25 MED ORDER — MONTELUKAST SODIUM 10 MG PO TABS
10.0000 mg | ORAL_TABLET | Freq: Every day | ORAL | Status: DC
  Administered 2021-08-25 – 2021-09-01 (×8): 10 mg via ORAL
  Filled 2021-08-25 (×8): qty 1

## 2021-08-25 MED ORDER — SENNOSIDES-DOCUSATE SODIUM 8.6-50 MG PO TABS
2.0000 | ORAL_TABLET | Freq: Two times a day (BID) | ORAL | Status: DC
  Administered 2021-08-25 – 2021-09-02 (×14): 2 via ORAL
  Filled 2021-08-25 (×15): qty 2

## 2021-08-25 MED ORDER — LIVING WELL WITH DIABETES BOOK
Freq: Once | Status: AC
  Filled 2021-08-25: qty 1

## 2021-08-25 MED ORDER — ZINC SULFATE 220 (50 ZN) MG PO CAPS
220.0000 mg | ORAL_CAPSULE | Freq: Every day | ORAL | Status: DC
  Administered 2021-08-26 – 2021-09-02 (×8): 220 mg via ORAL
  Filled 2021-08-25 (×8): qty 1

## 2021-08-25 MED ORDER — HYDRALAZINE HCL 25 MG PO TABS: 25.0000 mg | ORAL_TABLET | Freq: Three times a day (TID) | ORAL | Status: DC | PRN

## 2021-08-25 MED ORDER — AZATHIOPRINE 50 MG PO TABS
125.0000 mg | ORAL_TABLET | Freq: Every day | ORAL | Status: DC
  Administered 2021-08-26 – 2021-09-02 (×8): 125 mg via ORAL
  Filled 2021-08-25 (×8): qty 3

## 2021-08-25 MED ORDER — HYDROCORTISONE ACETATE 25 MG RE SUPP: 25.0000 mg | Freq: Two times a day (BID) | RECTAL | 0 refills | Status: DC

## 2021-08-25 MED ORDER — JUVEN PO PACK
1.0000 | PACK | Freq: Two times a day (BID) | ORAL | Status: DC
  Administered 2021-08-25 – 2021-09-02 (×12): 1 via ORAL
  Filled 2021-08-25 (×10): qty 1

## 2021-08-25 MED ORDER — CHLORHEXIDINE GLUCONATE CLOTH 2 % EX PADS
6.0000 | MEDICATED_PAD | Freq: Every day | CUTANEOUS | Status: DC
  Administered 2021-08-25 – 2021-09-02 (×8): 6 via TOPICAL

## 2021-08-25 MED ORDER — SERTRALINE HCL 100 MG PO TABS
100.0000 mg | ORAL_TABLET | Freq: Every day | ORAL | Status: DC
Start: 2021-08-26 — End: 2021-09-02
  Administered 2021-08-26 – 2021-09-02 (×8): 100 mg via ORAL
  Filled 2021-08-25 (×8): qty 1

## 2021-08-25 MED ORDER — SENNOSIDES-DOCUSATE SODIUM 8.6-50 MG PO TABS: 2.0000 | ORAL_TABLET | Freq: Two times a day (BID) | ORAL | Status: DC

## 2021-08-25 MED ORDER — LORAZEPAM 1 MG PO TABS: 0.5000 mg | ORAL_TABLET | Freq: Two times a day (BID) | ORAL | 5 refills | Status: DC | PRN

## 2021-08-25 MED ORDER — PROCHLORPERAZINE MALEATE 5 MG PO TABS
5.0000 mg | ORAL_TABLET | Freq: Four times a day (QID) | ORAL | Status: DC | PRN
  Administered 2021-08-27 – 2021-09-02 (×3): 10 mg via ORAL
  Filled 2021-08-25 (×3): qty 2

## 2021-08-25 MED ORDER — IPRATROPIUM-ALBUTEROL 0.5-2.5 (3) MG/3ML IN SOLN: 3.0000 mL | RESPIRATORY_TRACT | Status: DC | PRN

## 2021-08-25 MED ORDER — FLEET ENEMA 7-19 GM/118ML RE ENEM: 1.0000 | ENEMA | Freq: Once | RECTAL | Status: DC | PRN

## 2021-08-25 MED ORDER — SODIUM CHLORIDE 0.9% FLUSH
10.0000 mL | INTRAVENOUS | Status: DC | PRN
  Administered 2021-08-26: 10 mL

## 2021-08-25 MED ORDER — ENSURE MAX PROTEIN PO LIQD
11.0000 [oz_av] | Freq: Three times a day (TID) | ORAL | Status: DC
  Administered 2021-08-25 – 2021-09-02 (×17): 11 [oz_av] via ORAL

## 2021-08-25 MED ORDER — GUAIFENESIN-DM 100-10 MG/5ML PO SYRP
10.0000 mL | ORAL_SOLUTION | Freq: Four times a day (QID) | ORAL | Status: DC
  Administered 2021-08-25 – 2021-09-02 (×33): 10 mL via ORAL
  Filled 2021-08-25 (×33): qty 10

## 2021-08-25 MED ORDER — LORAZEPAM 0.5 MG PO TABS
0.5000 mg | ORAL_TABLET | Freq: Two times a day (BID) | ORAL | Status: DC | PRN
  Administered 2021-08-25 – 2021-09-01 (×12): 0.5 mg via ORAL
  Filled 2021-08-25 (×14): qty 1

## 2021-08-25 MED ORDER — APIXABAN 5 MG PO TABS
5.0000 mg | ORAL_TABLET | Freq: Two times a day (BID) | ORAL | Status: DC
  Administered 2021-08-25 – 2021-09-02 (×16): 5 mg via ORAL
  Filled 2021-08-25 (×16): qty 1

## 2021-08-25 MED ORDER — NITROGLYCERIN 0.4 MG SL SUBL
0.4000 mg | SUBLINGUAL_TABLET | SUBLINGUAL | Status: DC | PRN
  Administered 2021-09-01 (×2): 0.4 mg via SUBLINGUAL
  Filled 2021-08-25 (×3): qty 1

## 2021-08-25 MED ORDER — PREDNISONE 20 MG PO TABS
20.0000 mg | ORAL_TABLET | Freq: Every day | ORAL | Status: DC
  Administered 2021-08-26 – 2021-08-27 (×2): 20 mg via ORAL
  Filled 2021-08-25 (×2): qty 1

## 2021-08-25 MED ORDER — GUAIFENESIN-DM 100-10 MG/5ML PO SYRP: 5.0000 mL | ORAL_SOLUTION | Freq: Four times a day (QID) | ORAL | Status: DC | PRN

## 2021-08-25 MED ORDER — INSULIN ASPART 100 UNIT/ML IJ SOLN
0.0000 [IU] | Freq: Three times a day (TID) | INTRAMUSCULAR | Status: DC
  Administered 2021-08-26: 5 [IU] via SUBCUTANEOUS
  Administered 2021-08-26: 7 [IU] via SUBCUTANEOUS
  Administered 2021-08-26: 2 [IU] via SUBCUTANEOUS
  Administered 2021-08-27 (×2): 5 [IU] via SUBCUTANEOUS
  Administered 2021-08-28: 2 [IU] via SUBCUTANEOUS
  Administered 2021-08-28: 5 [IU] via SUBCUTANEOUS
  Administered 2021-08-28: 1 [IU] via SUBCUTANEOUS
  Administered 2021-08-29: 2 [IU] via SUBCUTANEOUS
  Administered 2021-08-29: 1 [IU] via SUBCUTANEOUS
  Administered 2021-08-30: 2 [IU] via SUBCUTANEOUS
  Administered 2021-08-30 (×2): 5 [IU] via SUBCUTANEOUS
  Administered 2021-08-31: 2 [IU] via SUBCUTANEOUS
  Administered 2021-08-31 (×2): 5 [IU] via SUBCUTANEOUS
  Administered 2021-09-01 (×2): 3 [IU] via SUBCUTANEOUS
  Administered 2021-09-01: 5 [IU] via SUBCUTANEOUS
  Administered 2021-09-02: 3 [IU] via SUBCUTANEOUS
  Administered 2021-09-02: 5 [IU] via SUBCUTANEOUS

## 2021-08-25 MED ORDER — CLOPIDOGREL BISULFATE 75 MG PO TABS
75.0000 mg | ORAL_TABLET | Freq: Every day | ORAL | Status: DC
Start: 2021-08-26 — End: 2021-09-02
  Administered 2021-08-26 – 2021-09-02 (×8): 75 mg via ORAL
  Filled 2021-08-25 (×8): qty 1

## 2021-08-25 MED ORDER — INSULIN ASPART 100 UNIT/ML IJ SOLN
0.0000 [IU] | Freq: Every day | INTRAMUSCULAR | Status: DC
  Administered 2021-08-25: 3 [IU] via SUBCUTANEOUS
  Administered 2021-08-28 – 2021-09-01 (×3): 2 [IU] via SUBCUTANEOUS

## 2021-08-25 MED ORDER — POLYETHYLENE GLYCOL 3350 17 G PO PACK
17.0000 g | PACK | Freq: Three times a day (TID) | ORAL | Status: DC
  Administered 2021-08-25 – 2021-09-02 (×13): 17 g via ORAL
  Filled 2021-08-25 (×19): qty 1

## 2021-08-25 MED ORDER — ADULT MULTIVITAMIN W/MINERALS CH
1.0000 | ORAL_TABLET | Freq: Every day | ORAL | Status: DC
Start: 2021-08-26 — End: 2021-09-02
  Administered 2021-08-26 – 2021-09-02 (×8): 1 via ORAL
  Filled 2021-08-25 (×8): qty 1

## 2021-08-25 MED ORDER — NYSTATIN 100000 UNIT/GM EX POWD: Freq: Three times a day (TID) | CUTANEOUS | 0 refills | Status: DC

## 2021-08-25 NOTE — Progress Notes (Addendum)
Patient transferred to [facility] room 1529 from [facility] this morning, 08/25/21. Patient tested positive for COVID-19 on 08/04/21. Orders for airborne isolation were removed 08/15/21. Based on the [place] "Isolation Guidance for COVID Positive Patients", the patient, who is immunocompromised as a solid organ transplant recipient, should not have been removed from isolation until day 21 from positive test (08/25/21) and only after "Provider's assessment". The plan of care will be to resume airborne precautions until a Provider can assess the patient today and/or Infection Prevention can review the chart today. Lovey Newcomer, NP notified of the situation and plan of care.

## 2021-08-25 NOTE — Progress Notes (Signed)
Inpatient Rehabilitation  Patient information reviewed and entered into eRehab system by Jhamir Pickup M. Avram Danielson, M.A., CCC/SLP, PPS Coordinator.  Information including medical coding, functional ability and quality indicators will be reviewed and updated through discharge.    

## 2021-08-25 NOTE — H&P (Shared)
Physical Medicine and Rehabilitation Admission H&P    Chief Complaint  Patient presents with   Functional deficits secondary to Covid infection complicated by NSEMI.     HPI: Katherine [name] is a 66 year old female with history of CAD s/p CABG/PCI- last 07/22, PAD, PAF/torsades Hx- on eliquis, chronic diastolic CHF, OSA--no  CPAP, T2DM with neuropathy, renal transplant, RA/FM w/chronic pain, COPD-3 L oxygen dependent who was admitted to [facility] on 07/25/21 with mild confusion, SOB with hypoxia despite increase in oxygen needs post exposure to flu, low grade fever/chills, diarrhea, pleuritic chest pain and generalized weakness.   She was found to have acute on chronic respiratory failure due to Covid PNA and treated with steriods, Remdisvir and Ceftriaxone. Hospital course significant for weakness with ongoing hypoxia requiring high flow oxygen as well as poorly controlled BS. On 12/24, she developed significant CP despite NTG and morphine. EKG showed diffuse ST and T wave abnormalities,  CXR showed acute on chronic fluid CHF as well as elevation in troponin's due to NSTEMI. She was transitioned to IV heparin, treated diuresis but continued to have rise in troponin with increase in oxygen needs.   Dr. Justine Null was consulted to assist with management of anxiety/depression and she was started on ativan bid in addition to increase in Zolfot to 250 mg/daily without SE. She was taken to cath lab on 01/05 but found to have stenosis obstructing procedure and procedure aborted. Medical treatment recommended per Dr. Ronney Asters.  Respiratory status improving and patient down to 3 L. She has had issues with constipation requiring disimpaction. Po intake is improving. She continues to have issues with weakness, decreased endurance as well as DOE. CIR recommended due to functional decline.     Review of Systems  Constitutional:  Negative for chills and fever.  HENT:  Negative for hearing loss and tinnitus.    Respiratory:  Negative for cough.        "Lungs still hurt"  Cardiovascular: Negative.        Chest pain this am before BB.   Gastrointestinal:  Positive for constipation (stools still hard) and nausea.  Musculoskeletal:  Positive for back pain (bottom is sore). Negative for falls.  Skin:  Negative for rash.  Neurological:  Positive for dizziness and headaches.  Psychiatric/Behavioral:  The patient has insomnia.     Past Medical History:  Diagnosis Date   Anemia    Anxiety    Bilateral carotid artery stenosis    Carotid duplex 03/2799: 3-49% LICA, 17-91% RICA, >50% RECA, f/u 1 yr suggested   CAD (coronary artery disease) of bypass graft 5/01; 3/'02, 8/'03, 10/'04; 1/15   PCI x 5 to SVG-D1    CAD in native artery 07/1993   3 Vessel Disease (LAD-D1 & RCA) -- CABG (Dx in setting of inferior STEMI-PTCA of RCA)   CAD S/P percutaneous coronary angioplasty    PCI to SVG-D1 insertion/native D1 x 4 = '01 -(S660 BMS 2.5 x 9 anastomosis- D1); '02 - distal overlap ACS Pixel 2.5 x 8  BMS; '03 distal/native ISR/Thrombosis - Pixel 2.5 x 13; '04 - ISR-  Taxus 2.5 x 20 (covered all);; 1/15 - mid SVG-D1 (50% distal ISR) - Promus P 2.75 x 20 -- 2.8 mm; 6/22: Extensive ISR PTCA & Anastomotic-Native Diag DES PCI (Frontier Onyx 2.25x12 (2.75-2.5 mm post-dilation   COPD mixed type ([place])    Followed by Dr. Lamonte Sakai "pulmonologist said no COPD"   Depression with anxiety    Diabetes mellitus  type 2 in obese ([place])    Diarrhea    started after cholecystectomy and mass removed from intestine   Dyslipidemia, goal LDL below 70    08/2012: TC 137, TG 200, HDL 32!, LDL 45; on statin (followed by Dr.Deterding)   ESRD (end stage renal disease) ([place]) 1991   s/p Cadaveric Renal Transplant [facility] - Dr. Jimmy Footman)    Family history of adverse reaction to anesthesia    mom's bp dropped during/after anesthesia   Fibromyalgia    GERD (gastroesophageal reflux disease)    Glomerulonephritis, chronic, rapidly progressive 1989    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 07/1993   Rescue PTCA of RCA -- referred for CABG.   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  Had having having lots of nausea and vomiting with poor p.o. intake.  Thought to have QT prolongation with multiple medications involved and hypomagnesemia, hypokalemia.  Tikosyn discontinued along with Zoloft and Phenergan.   Hypertension associated with diabetes ([place])    Mild aortic stenosis by prior echocardiogram 07/2019   Echo:  Mild aortic stenosis (gradients: Mean 14.3 mmHg -peak 24.9 mmHg).   Morbid obesity ([facility])    MRSA (methicillin resistant staph aureus) culture positive    OSA (obstructive sleep apnea)    no longer on CPAP or home O2, states she doesn't need now after lap band   PAD (peripheral artery disease) ([place]) 08/2013   LEA Dopplers to be read by Dr. Fletcher Anon   PAF (paroxysmal atrial fibrillation) ([place]) 06/2014   Noted on CardioNet Monitor  - --> rhythm control with Tikosyn (Dr. Rayann Heman); converted from warfarin to apixaban for anticoagulation.   Pneumonia    Recurrent boils    Bilateral Groin   Rheumatoid arthritis ([place])    Per Patient Report; associated with OA   S/p cadaver renal transplant 1991   [facility]   Torsades de pointes 05/12/2021   Unstable angina ([place]) 5/01; 3/'02, 8/'03, 10/'04; 1/15   x 5 occurences since Inf-Post STEMI in 1994    Past Surgical History:  Procedure Laterality Date   ABDOMINAL AORTOGRAM N/A 04/21/2018   Procedure: ABDOMINAL AORTOGRAM;  Surgeon: [name], MD;  Location: [place] [facility];  Service: Cardiovascular;  Laterality: N/A;   CATHETER REMOVAL     CHOLECYSTECTOMY N/A 10/29/2014   Procedure:  LAPAROSCOPIC CHOLECYSTECTOMY WITH INTRAOPERATIVE CHOLANGIOGRAM;  Surgeon: Excell Seltzer, MD;  Location: [facility];  Service: General;  Laterality: N/A;   CORONARY ANGIOPLASTY  1994   x5   CORONARY ARTERY BYPASS GRAFT  1995   LIMA-LAD, SVG-RPDA, SVG-D1   CORONARY STENT INTERVENTION N/A 02/20/2021   Procedure: PERCUTANEOUS CORONARY STENT INTERVENTION;  Surgeon: [name], MD;  Location: [place] [facility]; ostLCx 60% (Neg RFR 0.96);; SVG- D2 recurrent 90% ISR & 95% native D2 after graft-> DES PCI of 95% anastomotic D2 lesion (Onyx Frontier 2.25 mm x 12 mm => 2.75 mm @ overlap, 2.5 distal.);PTCA of ISR in body of graft. ->  2.5 mm scoring balloon & post-dil w/ 2.75 mm [place] balloon   ESOPHAGOGASTRODUODENOSCOPY N/A 10/15/2016   Procedure: ESOPHAGOGASTRODUODENOSCOPY (EGD);  Surgeon: Wilford Corner, MD;  Location: [facility];  Service: Endoscopy;  Laterality: N/A;   I & D EXTREMITY Right 01/29/2018   Procedure: IRRIGATION AND DEBRIDEMENT THUMB;  Surgeon: Dayna Barker, MD;  Location: [place];  Service: Plastics;  Laterality: Right;   INCISE AND DRAIN ABCESS     INTRAVASCULAR PRESSURE WIRE/FFR STUDY N/A 02/20/2021   Procedure: INTRAVASCULAR PRESSURE WIRE/FFR STUDY;  Surgeon: [name], MD;  Location: [place] [facility];  Service: Cardiovascular;  Laterality: N/A;   KIDNEY TRANSPLANT  1991   KNEE ARTHROSCOPY WITH LATERAL MENISECTOMY Left 12/03/2017   Procedure: LEFT KNEE ARTHROSCOPY WITH LATERAL MENISECTOMY;  Surgeon: Earlie Server, MD;  Location: [place];  Service: Orthopedics;  Laterality: Left;   LAPAROSCOPIC GASTRIC BANDING  04/2004; 10/'09, 2/'10   Port Replacement x 2   LEFT HEART CATH AND CORONARY ANGIOGRAPHY N/A 02/20/2021   Procedure: LEFT HEART CATH AND CORONARY ANGIOGRAPHY;  Surgeon: [name], MD;  Location: [place] [facility];  Service: Cardiovascular;  Laterality: N/A;   LEFT HEART CATH AND CORS/GRAFTS ANGIOGRAPHY N/A 04/21/2018   Procedure: LEFT HEART CATH AND  CORS/GRAFTS ANGIOGRAPHY;  Surgeon: [name], MD;  Location: [place] [facility];  Ost-Prox LAD 50% - proxLAD (pre & post D1) 100% CTO. Cx - patent, small OM1 (stable ~ ostial OM1 90%, too small for PCI) & 2 small LPL; Ost-distal RCA 100% CTO.  LIMA-LAD (not injected); SVG-dRCA patent, SVG-D1 - insertion stent ~20% ISR - Severe R CFA disease w/ focal Sub TO   LEFT HEART CATH AND CORS/GRAFTS ANGIOGRAPHY  5/'01, 3/'02, 8/'03, 10/'04; 1/'15   08/22/2013: LAD & RCA 100%; LIMA-LAD & SVG-rPDA patent; Cx-- OM1 60%, OM2 ostial ~50%; SVG-D1 - 80% mid, 50% distal ISR --PCI   LEFT HEART CATH AND CORS/GRAFTS ANGIOGRAPHY N/A 01/31/2021   Procedure: LEFT HEART CATH AND CORS/GRAFTS ANGIOGRAPHY;  Surgeon: Jolaine Artist, MD;  [facility];;   LEFT HEART CATH AND CORS/GRAFTS ANGIOGRAPHY N/A 08/21/2021   Procedure: LEFT HEART CATH AND CORS/GRAFTS ANGIOGRAPHY;  Surgeon: Belva Crome, MD;  Location: [place] [facility];  Service: Cardiovascular;  Laterality: N/A;   LEFT HEART CATHETERIZATION WITH CORONARY/GRAFT ANGIOGRAM N/A 08/23/2013   Procedure: LEFT HEART CATHETERIZATION WITH Beatrix Fetters;  Surgeon: Wellington Hampshire, MD;  Location: [place];  Service: Cardiovascular;  Laterality: N/A;   Lower Extremity Arterial Dopplers  08/2013   ABI: R 0.96, L 1.04   MULTIPLE TOOTH EXTRACTIONS  age 29   NM MYOVIEW LTD  03/2016   EF 62%. LOW RISK. C/W prior MI - no Ischemia. Apical hypokinesis.   PERCUTANEOUS CORONARY STENT INTERVENTION (PCI-S)  5/'01, 3/'02, 8/'03, 10/'04;   '01 - S660 BMS 2.5 x 9 - dSVG-D1 into D1; '02- post-stent stenosis - 2.5 x 8 Pixel BMS; '8\03: ISR/Thrombosis into native D1 - AngioJet, 2.5 x 13 Pixel; '04 - ISR 95% - covered stented area with Taxus DES 2.5 mm x 20 (2.88)   PERCUTANEOUS CORONARY STENT INTERVENTION (PCI-S)  08/23/2013   Procedure: PERCUTANEOUS CORONARY STENT INTERVENTION (PCI-S);  Surgeon: Wellington Hampshire, MD;  Location: [facility] CATH LAB;  Service:  Cardiovascular;;mid SVG-D1 80%; distal stent ~50% ISR; Promus [place] DES 2.75 mm xc 20 mm (2.8 mm)   PORT-A-CATH REMOVAL     kidney   TRANSTHORACIC ECHOCARDIOGRAM  07/2019   a) 07/2019: EF 55 to 60%.  No LVH.  Paradoxical septal WM-s/p CABG.  GRII DD.  Nl RV size and fxn.  Mild bilateral atrial dilation.  Mod MAC.  Trace MR.  Mild AS (gradients: Mean 14.3 mmHg -peak 24.9 mmHg).; B) 06/2020: EF 40 to 45%.  Moderate concentric LVH.  GRII DD.  Elevated LAP.  Mod HK mid Apical Ant-AntSept wall & mild Apical Dyskinesis.  Mod LA dilation.  Mild MR.  AoV sclerosis w/o AS.   TRANSTHORACIC ECHOCARDIOGRAM  01/30/2021   EF 55 to 60%.  Mild LVH.  GR 1 DD.  Elevated LAP.  Moderate LA dilation.  Mild MR with mild MS.  Mild aortic valve stenosis.:   TUBAL LIGATION     wrist fistula repair Left    dialysis for one year    Family History  Problem Relation Age of Onset   Cancer Mother        liver   Heart disease Father    Cancer Father        colon   Arrhythmia Brother        Atrial Fibrillation   Arrhythmia Paternal Aunt        Atrial Fibrillation    Social History:  reports that she has been smoking cigarettes. She has a 30.00 pack-year smoking history. She has never used smokeless tobacco. She reports that she does not drink alcohol and does not use drugs. Allergies:    Allergies  Allergen Reactions   Tetracycline Hives    Patient tolerated Doxycycline Dec 2020   Niacin Other (See Comments)    Mouth blisters   Niaspan [Niacin Er] Other (See Comments)    Mouth blisters   Sulfa Antibiotics Nausea Only and Other (See Comments)    "Tears up stomach"   Sulfonamide Derivatives Other (See Comments)    Reaction: per patient "tears her stomach up"   Codeine Nausea And Vomiting   Erythromycin Nausea And Vomiting   Hydromorphone Hcl Nausea And Vomiting   Morphine And Related Nausea And Vomiting   Nalbuphine Nausea And Vomiting    Nubain   Sulfasalazine Nausea Only and Other (See Comments)     per patient "tears her stomach up", "Tears up stomach"   Tape Rash and Other (See Comments)    No "plastic" tape," please----cloth tape only    Medications Prior to Admission  Medication Sig Dispense Refill   acetaminophen (TYLENOL) 500 MG tablet Take 1,000 mg by mouth daily.     albuterol (VENTOLIN HFA) 108 (90 Base) MCG/ACT inhaler TAKE 2 PUFFS BY MOUTH EVERY 6 HOURS AS NEEDED FOR WHEEZE OR SHORTNESS OF BREATH (Patient taking differently: 2 puffs every 6 (six) hours as needed for wheezing or shortness of breath.) 18 each 3   apixaban (ELIQUIS) 5 MG TABS tablet Take 1 tablet (5 mg total) by mouth 2 (two) times daily. APPOINTMENT NEEDED WITH CARDIOLOGIST FOR FURTHER REFILLS (Patient taking differently: Take 5 mg by mouth 2 (two) times daily.) 60 tablet 0   azaTHIOprine (IMURAN) 50 MG tablet Take 125 mg by mouth See admin instructions. Take 2 1/2 tablets (125 mg) by mouth daily at 3pm     budesonide (PULMICORT) 0.5 MG/2ML nebulizer solution Take 2 mLs (0.5 mg total) by nebulization 2 (two) times daily. 360 mL 1   calcitRIOL (ROCALTROL) 0.25 MCG capsule Take 0.25 mcg by mouth every 3 (three) days.      cetirizine (ZYRTEC) 5 MG tablet Take 5 mg by mouth daily.     clopidogrel (PLAVIX) 75 MG tablet Take 1 tablet (75 mg total) by mouth daily. 90 tablet 2   fluticasone (FLONASE) 50 MCG/ACT nasal spray PLACE 2 SPRAYS  INTO BOTH NOSTRILS 2 TIMES DAILY. (Patient taking differently: 2 sprays 2 (two) times daily as needed for allergies.) 96 mL 0   glimepiride (AMARYL) 4 MG tablet Take 4 mg by mouth daily.     insulin aspart (NOVOLOG FLEXPEN) 100 UNIT/ML FlexPen Inject 4-10 Units into the skin 3 (three) times daily with meals. Per sliding scale :not provided     isosorbide mononitrate (IMDUR) 60 MG 24 hr tablet Take 1 tablet (60 mg total) by mouth daily. 30 tablet 0   lamoTRIgine (LAMICTAL) 25 MG tablet TAKE 1 TABLET BY MOUTH EVERYDAY AT BEDTIME (Patient taking differently: Take 25 mg by mouth at bedtime.) 90  tablet 1   metFORMIN (GLUCOPHAGE) 500 MG tablet Take 1 tablet (500 mg total) by mouth 2 (two) times daily. 30 tablet 6   metoprolol tartrate (LOPRESSOR) 100 MG tablet Take 1 tablet (100 mg total) by mouth 2 (two) times daily. Appointment Required For Further Refills [phone] 60 tablet 0   montelukast (SINGULAIR) 10 MG tablet TAKE 1 TABLET BY MOUTH EVERYDAY AT BEDTIME (Patient taking differently: Take 10 mg by mouth at bedtime.) 90 tablet 0   nitroGLYCERIN (NITROSTAT) 0.4 MG SL tablet Place 1 tablet (0.4 mg total) under the tongue every 5 (five) minutes as needed for chest pain. 25 tablet 6   Oxycodone HCl 10 MG TABS Take 1 tablet (10 mg total) by mouth 4 (four) times daily as needed. (Patient taking differently: Take 10 mg by mouth 4 (four) times daily as needed (pain).) 100 tablet 0   OXYGEN Inhale 3 L into the lungs continuous.     pantoprazole (PROTONIX) 40 MG tablet TAKE 1 TABLET BY MOUTH EVERY DAY (Patient taking differently: 40 mg daily.) 15 tablet 0   predniSONE (DELTASONE) 5 MG tablet Take 5 mg by mouth every morning.     promethazine (PHENERGAN) 25 MG tablet Take 25 mg by mouth every 6 (six) hours as needed for nausea or vomiting.     rosuvastatin (CRESTOR) 20 MG tablet TAKE 1 TABLET BY MOUTH EVERY DAY (Patient taking differently: Take 20 mg by mouth daily.) 30 tablet 4   sertraline (ZOLOFT) 100 MG tablet Take 1 tablet (100 mg total) by mouth daily. 90 tablet 2   [DISCONTINUED] cyclobenzaprine (FLEXERIL) 10 MG tablet TAKE 2 TAB DAILY AT BEDTIME, MAY ALSO TAKE 1 TABLET BY MOUTH AT NOON AS NEEDED FOR MUSCLE SPASMS 90 tablet 2   [DISCONTINUED] Insulin Glargine (BASAGLAR KWIKPEN) 100 UNIT/ML SOPN Inject 12 Units into the skin daily before breakfast.     [DISCONTINUED] LORazepam (ATIVAN) 1 MG tablet Take 1 tablet (1 mg total) by mouth 2 (two) times daily. 60 tablet 5   arformoterol (BROVANA) 15 MCG/2ML NEBU Take 2 mLs (15 mcg total) by nebulization 2 (two) times daily. 120 mL 12    clotrimazole (MYCELEX) 10 MG troche Take 1 tablet (10 mg total) by mouth 5 (five) times daily as needed (thrush). (Patient not taking: Reported on 06/19/2021)     magnesium oxide (MAG-OX) 400 (240 Mg) MG tablet Take 1 tablet (400 mg total) by mouth daily. (Patient not taking: Reported on 05/07/2021) 30 tablet 0   Respiratory Therapy Supplies (NEBULIZER) DEVI Pt needs nebulizr machine dx J44.9 1 each 0    Drug Regimen Review { DRUG REGIMEN [id number]}  Home: Home Living Family/patient expects to be discharged to:: Private residence Living Arrangements: Other relatives Available Help at Discharge: Family, Available 24 hours/day Type of Home: House Home Access: Stairs to enter [employer]  of Steps: 5 Entrance Stairs-Rails: Left, Right Home Layout: One level Bathroom Shower/Tub: Chiropodist: Standard Bathroom Accessibility: Yes Home Equipment: Rollator (4 wheels), Cane - single point, Tub bench, Grab bars - toilet, Wheelchair - manual Additional Comments: wears 3L of supplemental O2 at home   Functional History: Prior Function Prior Level of Function : Independent/Modified Independent  Functional Status:  Mobility: Bed Mobility Overal bed mobility: Needs Assistance Bed Mobility: Rolling, Sidelying to Sit Rolling: Min assist Sidelying to sit: Min assist Supine to sit: HOB elevated, Mod assist, +2 for safety/equipment General bed mobility comments: Pt up in recliner and requests back to same Transfers Overall transfer level: Needs assistance Equipment used: Rolling walker (2 wheels) Transfers: Sit to/from Stand Sit to Stand: Min guard Bed to/from chair/wheelchair/BSC transfer type:: Step pivot Step pivot transfers: Min assist, Mod assist General transfer comment: Steady assist with cues for use of UEs Ambulation/Gait Ambulation/Gait assistance: Min assist, Min guard, +2 safety/equipment Gait Distance (Feet): 45 Feet (and 35 and 25' with seated  rest breaks) Assistive device: Rolling walker (2 wheels) Gait Pattern/deviations: Step-to pattern, Step-through pattern, Decreased step length - right, Decreased step length - left, Shuffle General Gait Details: step pvt bed to chair only - distance restricted by warmed high flow O2 Gait velocity: decr    ADL: ADL Overall ADL's : Needs assistance/impaired Eating/Feeding: Independent Grooming: Oral care, Wash/dry hands, Wash/dry face, Sitting, Set up Upper Body Bathing: Set up, Sitting Lower Body Bathing: Maximal assistance, Sitting/lateral leans Upper Body Dressing : Moderate assistance, Sitting Lower Body Dressing: Maximal assistance, +2 for safety/equipment, Sit to/from stand Toilet Transfer: Minimal assistance, +2 for safety/equipment, BSC/3in1, Stand-pivot Toileting- Clothing Manipulation and Hygiene: +2 for physical assistance, Total assistance, Sit to/from stand Functional mobility during ADLs: Minimal assistance, +2 for safety/equipment General ADL Comments: Patient required +2 to hand holds to stand and take steps to recliner. Slightly woblly in standing.  Cognition: Cognition Overall Cognitive Status: Within Functional Limits for tasks assessed Orientation Level: Oriented X4 Cognition Arousal/Alertness: Awake/alert Behavior During Therapy: WFL for tasks assessed/performed Overall Cognitive Status: Within Functional Limits for tasks assessed General Comments: ready to mobilize today   Blood pressure 140/74, pulse 76, temperature 98.7 F (37.1 C), temperature source Oral, resp. rate 20, height _0  (1.549 m), weight 88.9 kg, SpO2 94 %. Physical Exam Vitals and nursing note reviewed.  Constitutional:      Appearance: Normal appearance. She is obese.     Interventions: Nasal cannula in place.  Cardiovascular:     Rate and Rhythm: Rhythm regularly irregular.  Pulmonary:     Effort: No accessory muscle usage.     Breath sounds: No wheezing.     Comments: On 3 L oxygen  per [place] [facility]:     Mental Status: She is alert and oriented to person, place, and time.     Comments: Poor frustration tolerance.   Psychiatric:        Behavior: Behavior is uncooperative.    Results for orders placed or performed during the hospital encounter of 08/04/21 (from the past 48 hour(s))  Glucose, capillary     Status: Abnormal   Collection Time: 08/23/21 11:39 AM  Result Value Ref Range   Glucose-Capillary 237 (H) 70 - 99 mg/dL    Comment: Glucose reference range applies only to samples taken after fasting for at least 8 hours.   Comment 1 Notify RN    Comment 2 Document in Chart   Glucose, capillary     Status:  Abnormal   Collection Time: 08/23/21  4:12 PM  Result Value Ref Range   Glucose-Capillary 245 (H) 70 - 99 mg/dL    Comment: Glucose reference range applies only to samples taken after fasting for at least 8 hours.   Comment 1 Notify RN    Comment 2 Document in Chart   Glucose, capillary     Status: Abnormal   Collection Time: 08/23/21  9:12 PM  Result Value Ref Range   Glucose-Capillary 217 (H) 70 - 99 mg/dL    Comment: Glucose reference range applies only to samples taken after fasting for at least 8 hours.   Comment 1 Notify RN    Comment 2 Document in Chart   Basic metabolic panel     Status: Abnormal   Collection Time: 08/24/21  5:00 AM  Result Value Ref Range   Sodium 137 135 - 145 mmol/L   Potassium 4.9 3.5 - 5.1 mmol/L    Comment: DELTA CHECK NOTED NO VISIBLE HEMOLYSIS    Chloride 96 (L) 98 - 111 mmol/L   CO2 30 22 - 32 mmol/L   Glucose, Bld 254 (H) 70 - 99 mg/dL    Comment: Glucose reference range applies only to samples taken after fasting for at least 8 hours.   BUN 20 8 - 23 mg/dL   Creatinine, Ser 0.81 0.44 - 1.00 mg/dL   Calcium 9.4 8.9 - 10.3 mg/dL   GFR, Estimated >60 >60 mL/min    Comment: (NOTE) Calculated using the CKD-EPI Creatinine Equation (2021)    Anion gap 11 5 - 15    Comment: Performed at [facility], [place] [address] [place], [place] [address]  Glucose, capillary     Status: Abnormal   Collection Time: 08/24/21  7:59 AM  Result Value Ref Range   Glucose-Capillary 295 (H) 70 - 99 mg/dL    Comment: Glucose reference range applies only to samples taken after fasting for at least 8 hours.   Comment 1 Notify RN    Comment 2 Document in Chart   Glucose, capillary     Status: Abnormal   Collection Time: 08/24/21 12:05 PM  Result Value Ref Range   Glucose-Capillary 227 (H) 70 - 99 mg/dL    Comment: Glucose reference range applies only to samples taken after fasting for at least 8 hours.   Comment 1 Notify RN    Comment 2 Document in Chart   Glucose, capillary     Status: Abnormal   Collection Time: 08/24/21  4:15 PM  Result Value Ref Range   Glucose-Capillary 132 (H) 70 - 99 mg/dL    Comment: Glucose reference range applies only to samples taken after fasting for at least 8 hours.   Comment 1 Notify RN    Comment 2 Document in Chart   Glucose, capillary     Status: Abnormal   Collection Time: 08/24/21  9:30 PM  Result Value Ref Range   Glucose-Capillary 166 (H) 70 - 99 mg/dL    Comment: Glucose reference range applies only to samples taken after fasting for at least 8 hours.  Glucose, capillary     Status: Abnormal   Collection Time: 08/25/21  9:00 AM  Result Value Ref Range   Glucose-Capillary 306 (H) 70 - 99 mg/dL    Comment: Glucose reference range applies only to samples taken after fasting for at least 8 hours.   *Note: Due to a large number of results and/or encounters for the requested time period,  some results have not been displayed. A complete set of results can be found in Results Review.   No results found.     Medical Problem List and Plan: 1. Functional deficits secondary to ***  -patient may *** shower  -ELOS/Goals: *** 2.  Antithrombotics: -DVT/anticoagulation:  Pharmaceutical: Other (comment)--on Eliquis  -antiplatelet therapy: Plavix 3. Chronic  LBP/fibromyalgia/ Pain Management:  Oxycodone prn.  4. Mood: LCSW to follow for evaluation and support.   -antipsychotic agents: N/C 5. Neuropsych: This patient is capable of making decisions on her own behalf. 6. Skin/Wound Care: Routine pressure relief measures.   --continue protein supplements, Zinc and Vitamin C 7. Fluids/Electrolytes/Nutrition: Monitor I/O. Check lytes in am. 8. CAD/NSTEMI/ chronic systolic CHF: Monitor for signs of overload and w/daily weights.  --add low salt restrictions.  9. T2DM: Hgb A1c-7.9.  10. CKD s/p renal transplant.     ***  Bary Leriche, PA-C 08/25/2021

## 2021-08-25 NOTE — Discharge Summary (Signed)
Physician Discharge Summary  Katherine [name] PYK:[id number] DOB: April 26, 1956 DOA: 08/04/2021  PCP: [name], MD  Admit date: 08/04/2021 Discharge date: 08/25/2021  Admitted From: Home Disposition: CIR  Recommendations for Outpatient Follow-up:  Patient will be transferred to CIR for further inpatient rehab care Follow-up with cardiology for further management of her coronary artery disease and CHF Follow-up with psychiatry for further management of anxiety Patient has PICC line for poor venous access.  This will be removed prior to discharge   Discharge Condition: Stable CODE STATUS: Full code Diet recommendation: Heart healthy, carb modified  Brief/Interim Summary: 66 year old female with a history of CAD/CABG, chronic hypoxic respiratory failure, COPD, diabetes, kidney transplant, paroxysmal atrial fibrillation, admitted to the hospital with generalized weakness and shortness of breath.  She was found to be hypoxic on her normal oxygen requirement.  Found to have COVID-19/MRSA pneumonia.  Treated with antibiotics, remdesivir and steroids.  She was noted to have NSTEMI.  Seen by cardiology with plans for cardiac cath.  Overall respiratory status has improved and appears to be approaching baseline.  She was noted to be hypotensive on 1/3 and started on Neo-Synephrine infusion.  Suspect that she may have an element of adrenal insufficiency, since her steroids are being tapered and she is chronically on prednisone.  She has since been weaned of vasopressors. Cardiac cath attempted on 1/5 but unsuccessful due to vascular issues. Medical management recommended.  Currently, she is stable for discharge.  Discharge disposition is CIR  Discharge Diagnoses:  Principal Problem:   Acute on chronic respiratory failure with hypoxia (HCC) Active Problems:   Hypomagnesemia   Dyslipidemia, goal LDL below 70   NSTEMI (non-ST elevated myocardial infarction) (HCC)   CAD S/P percutaneous coronary  angioplasty - PCI x 5 to SVG-D1   Essential hypertension   PAD (peripheral artery disease) (HCC)   Paroxysmal atrial fibrillation (HCC); CHA2DS2VASc score F, HTN, CAD, CVA = 5   OSA (obstructive sleep apnea)   GAD (generalized anxiety disorder)   Acute CHF (congestive heart failure) ([place])   Unstable angina (HCC)   History of renal transplant   Uncontrolled type 2 diabetes mellitus with hyperglycemia, 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   Pressure injury of skin   COVID-19 virus infection   Obesity (BMI 30-39.9)   Chronic diastolic CHF (congestive heart failure) (HCC)   MRSA pneumonia (HCC)   Positive D dimer  Acute on chronic respiratory failure with hypoxia -Secondary to COVID-19 infection as well as MRSA pneumonia in the setting of COPD -She is chronically on 3 L of oxygen at home -She had worsening respiratory status in the hospital and at one point was requiring heated high flow at 25 L -Currently, she is weaned back down to her baseline requirement of 3 L and feels as though her respiratory status is approaching baseline   MRSA pneumonia -Sputum culture positive for MRSA -Treated with 7 days of doxycycline -Clinically she appears to be improved   COVID-19 pneumonia -Treated with remdesivir and steroids -Prednisone is currently being tapered back to her baseline dose -She is on day 21 since her positive test.  Clinically she has improved back to baseline and I do not feel that she needs further respiratory isolation.    Acute on chronic combined CHF -EF 45-50% -She had been diuresing with IV Lasix -Currently appears to be euvolemic and Lasix has been put on hold -Since blood pressures have stabilized, she will be started on low-dose Toprol -Review  GDMT limited by soft blood pressures -Further adjustment of medications to be made by cardiology as an outpatient   Hypotension -Noted to be hypotensive on 1/3, started on Neo-Synephrine infusion -Did  not appear to be septic or toxic -Possibly related to some element of adrenal insufficiency since her steroids were being tapered -She received a dose of IV hydrocortisone and her prednisone dose was adjusted -Phenylephrine has since been weaned off -Blood pressure has since stabilized   Type 2 diabetes -Continue basal/bolus insulin -Resume metformin and Amaryl on discharge -Further adjustment of medications based on blood sugars   History of renal transplant 1991 -Chronically on prednisone and Imuran -Follow-up with nephrology, Dr. Johnney Ou     NSTEMI -History of CAD status post CABG -Troponin peaked over 4000 -cardiac cath attempted on 1/5 but could not be performed due to multiple vascular issues including obstruction of left femoral artery and occlusion of right femoral artery. Left arm had HD access which prevented radial approach -recommendations for medical management -No plans on reattempt for cath unless patient has clear-cut ACS such as STEMI   Paroxysmal atrial fibrillation -anticoagulated with eliquis -Heart rate has been stable -She is been started on low-dose Toprol   Hyperlipidemia -Continue statin   Constipation -Manually disimpacted 1/4 -On MiraLAX -Started on Linzess -She is on Anusol suppositories for hemorrhoids   Generalized weakness -Seen by physical therapy and will be going to CIR on discharge  Discharge Instructions  Discharge Instructions     Diet - low sodium heart healthy   Complete by: As directed    Increase activity slowly   Complete by: As directed    No wound care   Complete by: As directed       Allergies as of 08/25/2021       Reactions   Tetracycline Hives   Patient tolerated Doxycycline Dec 2020   Niacin Other (See Comments)   Mouth blisters   Niaspan [niacin Er] Other (See Comments)   Mouth blisters   Sulfa Antibiotics Nausea Only, Other (See Comments)   "Tears up stomach"   Sulfonamide Derivatives Other (See Comments)    Reaction: per patient "tears her stomach up"   Codeine Nausea And Vomiting   Erythromycin Nausea And Vomiting   Hydromorphone Hcl Nausea And Vomiting   Morphine And Related Nausea And Vomiting   Nalbuphine Nausea And Vomiting   Nubain   Sulfasalazine Nausea Only, Other (See Comments)   per patient "tears her stomach up", "Tears up stomach"   Tape Rash, Other (See Comments)   No "plastic" tape," please----cloth tape only        Medication List     STOP taking these medications    isosorbide mononitrate 60 MG 24 hr tablet Commonly known as: IMDUR   metoprolol tartrate 100 MG tablet Commonly known as: LOPRESSOR       TAKE these medications    acetaminophen 500 MG tablet Commonly known as: TYLENOL Take 1,000 mg by mouth daily.   albuterol 108 (90 Base) MCG/ACT inhaler Commonly known as: VENTOLIN HFA TAKE 2 PUFFS BY MOUTH EVERY 6 HOURS AS NEEDED FOR WHEEZE OR SHORTNESS OF BREATH What changed: See the new instructions.   apixaban 5 MG Tabs tablet Commonly known as: Eliquis Take 1 tablet (5 mg total) by mouth 2 (two) times daily. APPOINTMENT NEEDED WITH CARDIOLOGIST FOR FURTHER REFILLS What changed: additional instructions   arformoterol 15 MCG/2ML Nebu Commonly known as: BROVANA Take 2 mLs (15 mcg total) by nebulization 2 (two) times  daily.   azaTHIOprine 50 MG tablet Commonly known as: IMURAN Take 125 mg by mouth See admin instructions. Take 2 1/2 tablets (125 mg) by mouth daily at 3pm   Basaglar KwikPen 100 UNIT/ML Inject 20 Units into the skin daily before breakfast. What changed: how much to take   budesonide 0.5 MG/2ML nebulizer solution Commonly known as: PULMICORT Take 2 mLs (0.5 mg total) by nebulization 2 (two) times daily.   calcitRIOL 0.25 MCG capsule Commonly known as: ROCALTROL Take 0.25 mcg by mouth every 3 (three) days.   cetirizine 5 MG tablet Commonly known as: ZYRTEC Take 5 mg by mouth daily.   clopidogrel 75 MG tablet Commonly  known as: PLAVIX Take 1 tablet (75 mg total) by mouth daily.   clotrimazole 10 MG troche Commonly known as: MYCELEX Take 1 tablet (10 mg total) by mouth 5 (five) times daily as needed (thrush).   cyclobenzaprine 10 MG tablet Commonly known as: FLEXERIL Take 1 tablet (10 mg total) by mouth 3 (three) times daily as needed for muscle spasms. What changed: See the new instructions.   fluticasone 50 MCG/ACT nasal spray Commonly known as: FLONASE PLACE 2 SPRAYS INTO BOTH NOSTRILS 2 TIMES DAILY. What changed: See the new instructions.   glimepiride 4 MG tablet Commonly known as: AMARYL Take 4 mg by mouth daily.   hydrocortisone 25 MG suppository Commonly known as: ANUSOL-HC Place 1 suppository (25 mg total) rectally 2 (two) times daily.   lamoTRIgine 25 MG tablet Commonly known as: LAMICTAL TAKE 1 TABLET BY MOUTH EVERYDAY AT BEDTIME What changed: See the new instructions.   linaclotide 145 MCG Caps capsule Commonly known as: LINZESS Take 1 capsule (145 mcg total) by mouth daily before breakfast.   LORazepam 1 MG tablet Commonly known as: ATIVAN Take 0.5 tablets (0.5 mg total) by mouth 2 (two) times daily as needed for anxiety. What changed:  how much to take when to take this reasons to take this   magnesium oxide 400 (240 Mg) MG tablet Commonly known as: MAG-OX Take 1 tablet (400 mg total) by mouth daily.   metFORMIN 500 MG tablet Commonly known as: GLUCOPHAGE Take 1 tablet (500 mg total) by mouth 2 (two) times daily.   metoprolol succinate 25 MG 24 hr tablet Commonly known as: Toprol XL Take 0.5 tablets (12.5 mg total) by mouth daily.   montelukast 10 MG tablet Commonly known as: SINGULAIR TAKE 1 TABLET BY MOUTH EVERYDAY AT BEDTIME What changed: See the new instructions.   Nebulizer Devi Pt needs nebulizr machine dx J44.9   nitroGLYCERIN 0.4 MG SL tablet Commonly known as: NITROSTAT Place 1 tablet (0.4 mg total) under the tongue every 5 (five) minutes as  needed for chest pain.   NovoLOG FlexPen 100 UNIT/ML FlexPen Generic drug: insulin aspart Inject 4-10 Units into the skin 3 (three) times daily with meals. Per sliding scale :not provided   nystatin powder Commonly known as: MYCOSTATIN/NYSTOP Apply topically 3 (three) times daily.   Oxycodone HCl 10 MG Tabs Take 1 tablet (10 mg total) by mouth 4 (four) times daily as needed. What changed: reasons to take this   OXYGEN Inhale 3 L into the lungs continuous.   pantoprazole 40 MG tablet Commonly known as: PROTONIX TAKE 1 TABLET BY MOUTH EVERY DAY What changed: how to take this   polyethylene glycol 17 g packet Commonly known as: MIRALAX / GLYCOLAX Take 17 g by mouth 2 (two) times daily.   predniSONE 10 MG tablet Commonly known  as: DELTASONE Take 15mg  po daily for 3 days then 10mg  po daily for 3 days then 5mg  daily What changed:  medication strength how much to take how to take this when to take this additional instructions   promethazine 25 MG tablet Commonly known as: PHENERGAN Take 25 mg by mouth every 6 (six) hours as needed for nausea or vomiting.   rosuvastatin 20 MG tablet Commonly known as: CRESTOR TAKE 1 TABLET BY MOUTH EVERY DAY   senna-docusate 8.6-50 MG tablet Commonly known as: Senokot-S Take 2 tablets by mouth 2 (two) times daily.   sertraline 100 MG tablet Commonly known as: ZOLOFT Take 1 tablet (100 mg total) by mouth daily.        Follow-up Information     [facility] Follow [facility].   Specialty: Home Health Services Why: [place] physical therapy/occupational therapy        [name], MD Follow up.   Specialty: Cardiology Why: [facility] - keep follow-up as scheduled with Dr. Ellyn Hack on Friday Oct 03, 2021 at 10:00 AM (Arrive by 9:45 AM). Contact information: [address] [place] [place] [address] [phone]                Allergies  Allergen Reactions   Tetracycline Hives     Patient tolerated Doxycycline Dec 2020   Niacin Other (See Comments)    Mouth blisters   Niaspan [Niacin Er] Other (See Comments)    Mouth blisters   Sulfa Antibiotics Nausea Only and Other (See Comments)    "Tears up stomach"   Sulfonamide Derivatives Other (See Comments)    Reaction: per patient "tears her stomach up"   Codeine Nausea And Vomiting   Erythromycin Nausea And Vomiting   Hydromorphone Hcl Nausea And Vomiting   Morphine And Related Nausea And Vomiting   Nalbuphine Nausea And Vomiting    Nubain   Sulfasalazine Nausea Only and Other (See Comments)    per patient "tears her stomach up", "Tears up stomach"   Tape Rash and Other (See Comments)    No "plastic" tape," please----cloth tape only    Consultations: Cardiology Psychiatry   Procedures/Studies: CT Angio Chest Pulmonary Embolism (PE) W or WO Contrast  Result Date: 08/05/2021 CLINICAL DATA:  PE suspected, shortness of breath, COVID positive EXAM: CT ANGIOGRAPHY CHEST WITH CONTRAST TECHNIQUE: Multidetector CT imaging of the chest was performed using the standard protocol during bolus administration of intravenous contrast. Multiplanar CT image reconstructions and MIPs were obtained to evaluate the vascular anatomy. CONTRAST:  54mL OMNIPAQUE IOHEXOL 350 MG/ML SOLN COMPARISON:  None. FINDINGS: Cardiovascular: Satisfactory opacification of the pulmonary arteries to the segmental level. Examination for pulmonary embolism is limited by breath motion artifact. Within this limitation, no evidence of pulmonary embolism through the segmental pulmonary arterial level. Cardiomegaly. Extensive 3 vessel coronary artery calcifications status post median sternotomy and CABG. No pericardial effusion. Aortic atherosclerosis. Mediastinum/Nodes: No enlarged mediastinal, hilar, or axillary lymph nodes. Thyroid gland, trachea, and esophagus demonstrate no significant findings. Lungs/Pleura: Very extensive heterogeneous and ground-glass  airspace opacity throughout the lungs. No pleural effusion or pneumothorax. Upper Abdomen: No acute abnormality. Status post cholecystectomy. Adjustable gastric lap band. Very atrophic kidneys. Musculoskeletal: No chest wall abnormality. No acute or significant osseous findings. Review of the MIP images confirms the above findings. IMPRESSION: 1. Examination for pulmonary embolism is limited by breath motion artifact. Within this limitation, no evidence of pulmonary embolism through the segmental pulmonary arterial level. 2. Very extensive  heterogeneous and ground-glass airspace opacity throughout the lungs, consistent with COVID-19 airspace disease. 3. Cardiomegaly and coronary artery disease. Aortic Atherosclerosis (ICD10-I70.0). Electronically Signed   By: Delanna Ahmadi M.D.   On: 08/05/2021 12:58   CARDIAC CATHETERIZATION  Result Date: 08/21/2021 CONCLUSIONS: Bilateral iliofemoral obstruction preventing catheterization.  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 Assumed patency of the left internal mammary over the last 2-3 heart catheterizations of the been performed. Prior hemodialysis access in the left arm preventing access. Prior coronary angiography over the past 2 years has been from right radial with native right coronary, left coronary, and saphenous vein bypass graft angiography and PCI performed from that approach. RECOMMENDATIONS: Medical therapy for coronary disease.  Elevated troponin in the setting of COVID-pneumonia given her underlying coronary disease could be ascribed to demand ischemia. The graft to the diagonal contains overlapping and it x2 layers of stents with distal anastomosis ISR that was recently dilated in July.  This could be the culprit for ongoing angina.  It seems unreasonable to continue to dilate in this territory without a long-term solution. Would not perform coronary angiography again in the  future unless the patient is having clear-cut ACS/STEMI.  If in that situation the only recourse would be to use right radial approach as vascular access. Discussed with Dr. Stanford Breed who will try to uptitrate the patient's medical regimen. Discussed in detail with the patient the limited options that we have for her coronary subset.   DG CHEST PORT 1 VIEW  Result Date: 08/14/2021 CLINICAL DATA:  COVID positive. EXAM: PORTABLE CHEST 1 VIEW COMPARISON:  08/12/2021 FINDINGS: Postoperative changes in the mediastinum. Mild cardiac enlargement. Patchy perihilar infiltrates in both lungs may represent edema or multifocal pneumonia. No pleural effusions. No pneumothorax. Mediastinal contours appear intact. IMPRESSION: Cardiac enlargement with patchy perihilar infiltrates in both lungs, possibly edema or multifocal pneumonia. Electronically Signed   By: Lucienne Capers M.D.   On: 08/14/2021 19:02   DG CHEST PORT 1 VIEW  Result Date: 08/12/2021 CLINICAL DATA:  PICC line placement. EXAM: PORTABLE CHEST 1 VIEW COMPARISON:  Radiographs 08/11/2021, 08/09/2021 and 08/07/2021. CTA 08/05/2021. FINDINGS: 1332 hours. Left arm PICC projects over the left perihilar region, which in correlation with recent CTA is at the level of a persistent left-sided SVC. Stable mild cardiomegaly post median sternotomy and CABG. The diffuse bilateral pulmonary opacities demonstrate further partial clearing. There is no confluent airspace opacity, pneumothorax or significant pleural effusion. The bones appear unchanged. IMPRESSION: 1. Tip of the left arm PICC projects over the mid aspect of a persistent left-sided SVC seen on prior CT. 2. Improving bilateral airspace opacities consistent with improving COVID-19 infection. Electronically Signed   By: Richardean Sale M.D.   On: 08/12/2021 14:07   DG CHEST PORT 1 VIEW  Result Date: 08/11/2021 CLINICAL DATA:  Respiratory failure EXAM: PORTABLE CHEST 1 VIEW COMPARISON:  08/09/2021 FINDINGS:  Multifocal patchy opacities with relative sparing of the left upper lobe, reflecting multifocal pneumonia, grossly unchanged. No pleural effusion or pneumothorax. Mild cardiomegaly.  Postsurgical changes related to prior CABG. Median sternotomy. IMPRESSION: Multifocal pneumonia, grossly unchanged. Electronically Signed   By: Julian Hy M.D.   On: 08/11/2021 06:57   DG CHEST PORT 1 VIEW  Result Date: 08/09/2021 CLINICAL DATA:  MRSA pneumonia EXAM: PORTABLE CHEST 1 VIEW COMPARISON:  10/08/2020 FINDINGS: Changes of CABG. Cardiomegaly. Severe diffuse bilateral airspace disease, worsening since prior study. No effusions. No acute  bony abnormality. IMPRESSION: Severe diffuse bilateral airspace disease, worsening since prior study. Electronically Signed   By: Rolm Baptise M.D.   On: 08/09/2021 09:21   DG CHEST PORT 1 VIEW  Result Date: 08/07/2021 CLINICAL DATA:  Shortness of breath, COVID-19 positivity, initial encounter EXAM: PORTABLE CHEST 1 VIEW COMPARISON:  08/05/2021 FINDINGS: Cardiac shadow is stable. Lungs are well aerated bilaterally. Patchy airspace opacity is noted slightly improved when compare with the prior exam. No new focal abnormality is noted. IMPRESSION: Patchy airspace opacities improved in the interval from the prior study. Electronically Signed   By: Inez Catalina M.D.   On: 08/07/2021 22:35   DG CHEST PORT 1 VIEW  Result Date: 08/05/2021 CLINICAL DATA:  Weakness and shortness of breath. EXAM: PORTABLE CHEST 1 VIEW COMPARISON:  08/04/2021 FINDINGS: 0804 hours. Low volumes. The cardio pericardial silhouette is enlarged. Patchy bilateral airspace disease is progressive in the interval. No pleural effusion. The visualized bony structures of the thorax show no acute abnormality. Telemetry leads overlie the chest. IMPRESSION: Patchy bilateral airspace disease, progressive in the interval. Electronically Signed   By: Misty Stanley M.D.   On: 08/05/2021 08:31   DG Chest Portable 1  View  Result Date: 08/04/2021 CLINICAL DATA:  COVID positive with generalized weakness. EXAM: PORTABLE CHEST 1 VIEW COMPARISON:  None. FINDINGS: Multiple sternal wires and vascular clips are seen. Mild, ill-defined areas of atelectasis and/or infiltrate are seen throughout the right lung and periphery of the left lung base. There is no evidence of a pleural effusion or pneumothorax. The cardiac silhouette is mildly enlarged and unchanged in size. The visualized skeletal structures are unremarkable. IMPRESSION: 1. Evidence of prior median sternotomy/CABG. 2. Ill-defined areas of bilateral atelectasis and/or infiltrate. Electronically Signed   By: Virgina Norfolk M.D.   On: 08/04/2021 03:57   ECHOCARDIOGRAM COMPLETE  Result Date: 08/09/2021    ECHOCARDIOGRAM REPORT   Patient Name:   ZIKERIA KEOUGH Date of Exam: 08/09/2021 Medical Rec #:  [id number]          Height:       61.0 in Accession #:    [id number]         Weight:       205.2 lb Date of Birth:  07-13-1956          BSA:          1.910 m Patient Age:    66 years           BP:           161/81 mmHg Patient Gender: F                  HR:           58 bpm. Exam Location:  Inpatient Procedure: 2D Echo, Cardiac Doppler, Color Doppler and Intracardiac            Opacification Agent Indications:    Dyspnea  History:        Patient has prior history of Echocardiogram examinations, most                 recent 01/30/2021. Previous Myocardial Infarction and CAD, Prior                 CABG, Signs/Symptoms:Dyspnea; Risk Factors:Hypertension,                 Diabetes and Dyslipidemia. Covid positive.  Sonographer:    Merrie Roof RDCS Referring Phys: [phone] A CALDWELL Cephus Slater  IMPRESSIONS  1. Left ventricular ejection fraction, by estimation, is 45 to 50%. The left ventricle has mildly decreased function. The left ventricle demonstrates regional wall motion abnormalities (see scoring diagram/findings for description). There is mild left ventricular hypertrophy.  Left ventricular diastolic function could not be evaluated.  2. Right ventricular systolic function is normal. The right ventricular size is normal.  3. The mitral valve is grossly normal. Trivial mitral valve regurgitation.  4. The aortic valve has an indeterminant number of cusps. There is moderate calcification of the aortic valve. Aortic valve regurgitation is not visualized. Aortic valve sclerosis/calcification is present, without any evidence of aortic stenosis. Comparison(s): Prior images reviewed side by side. Wall motion more similar to last study with Definity contrast on 07/12/2020, although LVEF somewhat better. FINDINGS  Left Ventricle: Left ventricular ejection fraction, by estimation, is 45 to 50%. The left ventricle has mildly decreased function. The left ventricle demonstrates regional wall motion abnormalities. Definity contrast agent was given IV to delineate the left ventricular endocardial borders. The left ventricular internal cavity size was normal in size. There is mild left ventricular hypertrophy. Left ventricular diastolic function could not be evaluated.  LV Wall Scoring: The apex is dyskinetic. The apical septal segment is hypokinetic. The entire anterior wall, entire lateral wall, anterior septum, entire inferior wall, mid inferoseptal segment, and basal inferoseptal segment are normal. Right Ventricle: The right ventricular size is normal. No increase in right ventricular wall thickness. Right ventricular systolic function is normal. Left Atrium: Left atrial size was normal in size. Right Atrium: Right atrial size was normal in size. Pericardium: There is no evidence of pericardial effusion. Mitral Valve: The mitral valve is grossly normal. Mild mitral annular calcification. Trivial mitral valve regurgitation. Tricuspid Valve: The tricuspid valve is grossly normal. Tricuspid valve regurgitation is mild. Aortic Valve: The aortic valve has an indeterminant number of cusps. There is  moderate calcification of the aortic valve. Aortic valve regurgitation is not visualized. Aortic valve sclerosis/calcification is present, without any evidence of aortic stenosis. Pulmonic Valve: The pulmonic valve was grossly normal. Pulmonic valve regurgitation is trivial. Aorta: The aortic root is normal in size and structure. IAS/Shunts: The interatrial septum was not well visualized.  LEFT VENTRICLE PLAX 2D LVIDd:         4.40 cm LVIDs:         3.30 cm LV PW:         1.00 cm LV IVS:        1.10 cm LVOT diam:     2.00 cm LVOT Area:     3.14 cm  LEFT ATRIUM         Index LA diam:    4.80 cm 2.51 cm/m   AORTA Ao Root diam: 3.00 cm  SHUNTS Systemic Diam: 2.00 cm Rozann Lesches MD Electronically signed by Rozann Lesches MD Signature Date/Time: 08/09/2021/3:43:40 PM    Final    VAS [place] LOWER EXTREMITY VENOUS (DVT)  Result Date: 08/10/2021  Lower Venous DVT Study Patient Name:  POSIE LILLIBRIDGE  Date of Exam:   08/09/2021 Medical Rec #: [id number]           Accession #:    [id number] Date of Birth: 10/08/1955           Patient Gender: F Patient Age:   68 years Exam Location:  [facility] Procedure:      VAS [place] LOWER EXTREMITY VENOUS (DVT) Referring Phys: A POWELL JR --------------------------------------------------------------------------------  Indications: Covid, D-Dimer.  Limitations:  On high flow oxygen. Pain with compression and body habitus. Comparison Study: Prior negative bilateral LEV done 01/07/2011 Performing Technologist: Sharion Dove RVS  Examination Guidelines: A complete evaluation includes B-mode imaging, spectral Doppler, color Doppler, and power Doppler as needed of all accessible portions of each vessel. Bilateral testing is considered an integral part of a complete examination. Limited examinations for reoccurring indications may be performed as noted. The reflux portion of the exam is performed with the patient in reverse Trendelenburg.   +---------+---------------+---------+-----------+----------+--------------+  RIGHT     Compressibility Phasicity Spontaneity Properties Thrombus Aging  +---------+---------------+---------+-----------+----------+--------------+  CFV       Full            Yes       Yes                                    +---------+---------------+---------+-----------+----------+--------------+  SFJ       Full                                                             +---------+---------------+---------+-----------+----------+--------------+  FV Prox   Full                                                             +---------+---------------+---------+-----------+----------+--------------+  FV Mid    Full                                                             +---------+---------------+---------+-----------+----------+--------------+  FV Distal Full                                                             +---------+---------------+---------+-----------+----------+--------------+  PFV       Full                                                             +---------+---------------+---------+-----------+----------+--------------+  POP       Full            Yes       Yes                                    +---------+---------------+---------+-----------+----------+--------------+  PTV       Full                                                             +---------+---------------+---------+-----------+----------+--------------+  PERO      Full                                                             +---------+---------------+---------+-----------+----------+--------------+   +---------+---------------+---------+-----------+----------+--------------+  LEFT      Compressibility Phasicity Spontaneity Properties Thrombus Aging  +---------+---------------+---------+-----------+----------+--------------+  CFV       Full            Yes       Yes                                     +---------+---------------+---------+-----------+----------+--------------+  SFJ       Full                                                             +---------+---------------+---------+-----------+----------+--------------+  FV Prox   Full                                                             +---------+---------------+---------+-----------+----------+--------------+  FV Mid    Full                                                             +---------+---------------+---------+-----------+----------+--------------+  FV Distal Full                                                             +---------+---------------+---------+-----------+----------+--------------+  PFV       Full                                                             +---------+---------------+---------+-----------+----------+--------------+  POP       Full            Yes       Yes                                    +---------+---------------+---------+-----------+----------+--------------+  PTV       Full                                                             +---------+---------------+---------+-----------+----------+--------------+  PERO      Full                                                             +---------+---------------+---------+-----------+----------+--------------+     Summary: BILATERAL: No ultrasound evidence of deep venous thrombosis  -No evidence of popliteal cyst, bilaterally.   *See table(s) above for measurements and observations. Electronically signed by Jamelle Haring on 08/10/2021 at 3:30:07 PM.    Final    [place] EKG SITE RITE  Result Date: 08/11/2021 If Site Rite image not attached, placement could not be confirmed due to current cardiac rhythm.     Subjective: Feels well today.  No shortness of breath.  She did have a bowel movement yesterday.  Discharge Exam: Vitals:   08/25/21 0445 08/25/21 0500 08/25/21 0600 08/25/21 0903  BP:    140/74  Pulse:    76  Resp: 15 19 17 20   Temp:     98.7 F (37.1 C)  TempSrc:    Oral  SpO2:    94%  Weight:      Height:        General: Pt is alert, awake, not in acute distress Cardiovascular: RRR, S1/S2 +, no rubs, no gallops Respiratory: CTA bilaterally, no wheezing, no rhonchi Abdominal: Soft, NT, ND, bowel sounds + Extremities: no edema, no cyanosis    The results of significant diagnostics from this hospitalization (including imaging, microbiology, ancillary and laboratory) are listed below for reference.     Microbiology: No results found for this or any previous visit (from the past 240 hour(s)).   Labs: BNP (last 3 results) Recent Labs    08/11/21 0309 08/12/21 0312 08/13/21 0457  BNP 202.5* 194.7* 614.4*   Basic Metabolic Panel: Recent Labs  Lab 08/20/21 0417 08/21/21 0500 08/21/21 2020 08/22/21 0430 08/23/21 0545 08/24/21 0500  NA 136 133*  --  137 133* 137  K 3.7 4.1  --  4.3 4.0 4.9  CL 101 95*  --  99 97* 96*  CO2 29 30  --  27 32 30  GLUCOSE 170* 273*  --  133* 188* 254*  BUN 28* 24*  --  20 23 20   CREATININE 0.79 0.81 0.79 0.90 0.82 0.81  CALCIUM 8.8* 8.8*  --  9.0 8.8* 9.4  MG 2.1 2.2  --   --  2.3  --    Liver Function Tests: No results for input(s): AST, ALT, ALKPHOS, BILITOT, PROT, ALBUMIN in the last 168 hours. No results for input(s): LIPASE, AMYLASE in the last 168 hours. No results for input(s): AMMONIA in the last 168 hours. CBC: Recent Labs  Lab 08/20/21 0417 08/21/21 0500 08/21/21 2020 08/22/21 0430 08/23/21 0545  WBC 10.9* 8.2 8.7 7.6 6.3  HGB 12.2 10.9* 12.2 11.3* 10.0*  HCT 37.5 33.9* 37.3 35.2* 31.5*  MCV 98.2 97.4 97.6 98.9 100.6*  PLT 225 163 167 149* 120*   Cardiac Enzymes: No results for input(s): CKTOTAL, CKMB, CKMBINDEX, TROPONINI in the last 168 hours. BNP: Invalid input(s): POCBNP CBG: Recent Labs  Lab 08/24/21 0759 08/24/21 1205 08/24/21 1615 08/24/21 2130 08/25/21 0900  GLUCAP 295* 227* 132* 166* 306*   D-Dimer No results for input(s): DDIMER  in the last 72 hours. Hgb A1c No results for input(s): HGBA1C in the last 72  hours. Lipid Profile No results for input(s): CHOL, HDL, LDLCALC, TRIG, CHOLHDL, LDLDIRECT in the last 72 hours. Thyroid function studies Recent Labs    08/23/21 0545  TSH 0.736   Anemia work up No results for input(s): VITAMINB12, FOLATE, FERRITIN, TIBC, IRON, RETICCTPCT in the last 72 hours. Urinalysis    Component Value Date/Time   COLORURINE YELLOW 08/04/2021 0746   APPEARANCEUR CLEAR 08/04/2021 0746   LABSPEC 1.016 08/04/2021 0746   PHURINE 6.0 08/04/2021 0746   GLUCOSEU NEGATIVE 08/04/2021 0746   GLUCOSEU 100 (A) 08/13/2020 1550   HGBUR NEGATIVE 08/04/2021 0746   BILIRUBINUR NEGATIVE 08/04/2021 0746   KETONESUR NEGATIVE 08/04/2021 0746   PROTEINUR 100 (A) 08/04/2021 0746   UROBILINOGEN 0.2 08/13/2020 1550   NITRITE NEGATIVE 08/04/2021 0746   LEUKOCYTESUR SMALL (A) 08/04/2021 0746   Sepsis Labs Invalid input(s): PROCALCITONIN,  WBC,  LACTICIDVEN Microbiology No results found for this or any previous visit (from the past 240 hour(s)).   Time coordinating discharge: 38mins  SIGNED:   Kathie Dike, MD  [facility] 08/25/2021, 10:44 AM   If 7PM-7AM, please contact night-coverage [email or web address]

## 2021-08-25 NOTE — Progress Notes (Signed)
PMR Admission Coordinator Pre-Admission Assessment   Patient: Katherine [name] is an 66 y.o., female MRN: [id number] DOB: [birth year] Height: 5\' 1"  (154.9 cm) Weight: 88.8 kg   Insurance Information HMO:     PPO:      PCP:      IPA:      80/20: yes     OTHER:  PRIMARY: Medicare A B      Policy#: [id number]     Phone#: Verified online    Fax#:  Pre-Cert#:       Employer:  Benefits:  Phone #:      Name:  Eff. Date: Parts A ad B effective  04/17/88 Deduct: $1600 Out of Pocket Max:  None      Life Max: N/A  CIR: 100%      SNF: 100 days Outpatient: 80%     Co-Pay: 20% Home Health: 100%      Co-Pay: none DME: 80%     Co-Pay: 20% Providers: patient's choice   SECONDARY: Medicaid of [place]       Policy#: [id number]     Phone#:    Financial Counselor:       Phone#:    The Actuary for patients in Inpatient Rehabilitation Facilities with attached Privacy Act [place] [facility] Records was provided and verbally reviewed with: Patient   Emergency Contact Information Contact Information       Name Relation Home Work Mobile    Austin,Sherry Niece [phone]               Current Medical History  Patient Admitting Diagnosis: COVID, Respiratory Failure, Debility    History of Present Illness: Pt. Is a 66 year old female with a history of CAD/CABG, chronic hypoxic respiratory failure, COPD, diabetes, kidney transplant, paroxysmal atrial fibrillation, admitted to [facility] 08/04/21 with generalized weakness and shortness of breath.  She was found to be hypoxic on her normal oxygen requirement.  Found to have COVID-19/MRSA pneumonia.  Treated with antibiotics, remdesivir and steroids. Admission chemistries were as follows: Urinalysis showed proteinuria 100 mg deciliter and small leukocyte esterase.  CBC showed a white count 5.4, hemoglobin 12.0 g deciliter platelets 197.  COVID PCR was positive.  CMP showed normal electrolytes when sodium corrected to  glucose and calcium to albumin.  Glucose 196 mg/dL and albumin 3.4 g/dL.  The rest of the CMP values were unremarkable.  BNP 189.6 pg mL.  Troponin was 43 and then 49 ng/L.  Magnesium was 1.5 mg/dL.  D-dimer, ferritin and CRP were elevated. She was noted to have NSTEMI.  Seen by cardiology with plans for cardiac cath.  Overall respiratory status has improved and appears to be approaching baseline.  She was noted to be hypotensive on 1/3 and started on Neo-Synephrine infusion.  Suspect that she may have an element of adrenal insufficiency, since her steroids are being tapered and she is chronically on prednisone.  She has since been weaned of vasopressors. Cardiac cath attempted on 1/5 but unsuccessful due to vascular issues CIR was consulted to assist return to PLOF.    Patient's medical record from [facility] has been reviewed by the rehabilitation admission coordinator and physician.   Past Medical History      Past Medical History:  Diagnosis Date   Anemia     Anxiety     Bilateral carotid artery stenosis      Carotid duplex 10/2200: 5-42% LICA, 70-62% RICA, >37% RECA, f/u 1 yr suggested   CAD (  coronary artery disease) of bypass graft 5/01; 3/'02, 8/'03, 10/'04; 1/15    PCI x 5 to SVG-D1    CAD in native artery 07/1993    3 Vessel Disease (LAD-D1 & RCA) -- CABG (Dx in setting of inferior STEMI-PTCA of RCA)   CAD S/P percutaneous coronary angioplasty      PCI to SVG-D1 insertion/native D1 x 4 = '01 -(S660 BMS 2.5 x 9 anastomosis- D1); '02 - distal overlap ACS Pixel 2.5 x 8  BMS; '03 distal/native ISR/Thrombosis - Pixel 2.5 x 13; '04 - ISR-  Taxus 2.5 x 20 (covered all);; 1/15 - mid SVG-D1 (50% distal ISR) - Promus P 2.75 x 20 -- 2.8 mm; 6/22: Extensive ISR PTCA & Anastomotic-Native Diag DES PCI ([place] Onyx 2.25x12 (2.75-2.5 mm post-dilation   COPD mixed type ([place])      Followed by Dr. Lamonte Sakai "pulmonologist said no COPD"   Depression with anxiety     Diabetes mellitus type 2  in obese ([place])     Diarrhea      started after cholecystectomy and mass removed from intestine   Dyslipidemia, goal LDL below 70      08/2012: TC 137, TG 200, HDL 32!, LDL 45; on statin (followed by Dr.Deterding)   ESRD (end stage renal disease) ([place]) 1991    s/p Cadaveric Renal Transplant ([facility] - Dr. Jimmy Footman)    Family history of adverse reaction to anesthesia      mom's bp dropped during/after anesthesia   Fibromyalgia     GERD (gastroesophageal reflux disease)     Glomerulonephritis, chronic, rapidly progressive 1989   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 07/1993    Rescue PTCA of RCA -- referred for CABG.   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  Had having having lots of nausea and vomiting with poor p.o. intake.  Thought to have QT prolongation with multiple medications involved and hypomagnesemia, hypokalemia.  Tikosyn discontinued along with Zoloft and Phenergan.   Hypertension associated with diabetes ([place])     Mild aortic stenosis by prior echocardiogram 07/2019    Echo:  Mild aortic stenosis (gradients: Mean 14.3 mmHg -peak 24.9 mmHg).   Morbid obesity ([facility])     MRSA (methicillin resistant staph aureus) culture positive     OSA (obstructive sleep apnea)      no longer on CPAP or home O2, states she doesn't need now after lap band   PAD (peripheral artery disease) ([place]) 08/2013    LEA Dopplers to be read by Dr. Fletcher Anon   PAF (paroxysmal atrial fibrillation) ([place]) 06/2014    Noted on CardioNet Monitor  - --> rhythm control with Tikosyn (Dr. Rayann Heman); converted from warfarin to apixaban for anticoagulation.   Pneumonia     Recurrent boils      Bilateral Groin    Rheumatoid arthritis ([place])      Per Patient Report; associated with OA   S/p cadaver renal transplant 1991    [facility]   Torsades de pointes 05/12/2021   Unstable angina ([place]) 5/01; 3/'02, 8/'03, 10/'04; 1/15    x 5 occurences since  Inf-Post STEMI in 1994      Has the patient had major surgery during 100 days prior to admission? Yes   Family History   family history includes Arrhythmia in her brother and paternal aunt; Cancer in her father and mother; Heart disease in her father.   Current Medications   Current Facility-Administered Medications:    (feeding supplement) PROSource Plus liquid 30 mL, 30 mL, Oral, BID BM, Belva Crome, MD, 30 mL at 08/23/21 0958   0.9 %  sodium chloride infusion, 250 mL, Intravenous, PRN, Belva Crome, MD   acetaminophen (TYLENOL) tablet 650 mg, 650 mg, Oral, Q6H PRN, 650 mg at 08/20/21 0423 **OR** acetaminophen (TYLENOL) suppository 650 mg, 650 mg, Rectal, Q6H PRN, Belva Crome, MD   alteplase (CATHFLO ACTIVASE) injection 2 mg, 2 mg, Intracatheter, Once, Belva Crome, MD   apixaban Arne [place]) tablet 5 mg, 5 mg, Oral, BID, Dunn, Dayna N, PA-C, 5 mg at 08/23/21 2100   arformoterol (BROVANA) nebulizer solution 15 mcg, 15 mcg, Nebulization, BID, Belva Crome, MD, 15 mcg at 08/24/21 5053   ascorbic acid (VITAMIN C) tablet 500 mg, 500 mg, Oral, Daily, Belva Crome, MD, 500 mg at 08/23/21 1014   azaTHIOprine (IMURAN) tablet 125 mg, 125 mg, Oral, Daily, Belva Crome, MD, 125 mg at 08/24/21 0908   budesonide (PULMICORT) nebulizer solution 0.5 mg, 2 mL, Nebulization, BID, Belva Crome, MD, 0.5 mg at 08/24/21 9767   calcitRIOL (ROCALTROL) capsule 0.25 mcg, 0.25 mcg, Oral, Q3 days, Belva Crome, MD, 0.25 mcg at 08/23/21 0957   Chlorhexidine Gluconate Cloth 2 % PADS 6 each, 6 each, Topical, Daily, Belva Crome, MD, 6 each at 08/22/21 1652   clopidogrel (PLAVIX) tablet 75 mg, 75 mg, Oral, Daily, Belva Crome, MD, 75 mg at 08/23/21 1014   cyclobenzaprine  (FLEXERIL) tablet 10 mg, 10 mg, Oral, TID PRN, Belva Crome, MD, 10 mg at 08/16/21 2243   feeding supplement (GLUCERNA SHAKE) (GLUCERNA SHAKE) liquid 237 mL, 237 mL, Oral, TID BM, Belva Crome, MD, 237 mL at 08/23/21 1948   fluticasone (FLONASE) 50 MCG/ACT nasal spray 2 spray, 2 spray, Each Nare, Daily, Belva Crome, MD, 2 spray at 08/24/21 0911   guaiFENesin-dextromethorphan (ROBITUSSIN DM) 100-10 MG/5ML syrup 10 mL, 10 mL, Oral, QID, Belva Crome, MD, 10 mL at 08/23/21 2100   hydrALAZINE (APRESOLINE) tablet 25 mg, 25 mg, Oral, Q8H PRN, Belva Crome, MD   hydrocortisone (ANUSOL-HC) suppository 25 mg, 25 mg, Rectal, BID, Belva Crome, MD, 25 mg at 08/23/21 2107   insulin aspart (novoLOG) injection 0-15 Units, 0-15 Units, Subcutaneous, TID WC, Belva Crome, MD, 8 Units at 08/24/21 3419   insulin aspart (novoLOG) injection 0-5 Units, 0-5 Units, Subcutaneous, QHS, Belva Crome, MD, 2 Units at 08/23/21 2116   insulin aspart (novoLOG) injection 8 Units, 8 Units, Subcutaneous, TID WC, Belva Crome, MD, 8 Units at 08/24/21 0909   insulin glargine-yfgn (SEMGLEE) injection 20 Units, 20 Units, Subcutaneous, Daily, Memon, Jehanzeb, MD   ipratropium-albuterol (DUONEB) 0.5-2.5 (3) MG/3ML nebulizer solution 3 mL, 3 mL, Nebulization, Q4H PRN, Belva Crome, MD, 3 mL at 08/08/21 1711   lamoTRIgine (LAMICTAL) tablet 25 mg, 25 mg, Oral, QHS, Belva Crome, MD, 25 mg at 08/23/21 2100   linaclotide (LINZESS) capsule 145 mcg, 145 mcg, Oral, QAC breakfast, Kathie Dike, MD, 145 mcg at 08/24/21 0908   linagliptin (TRADJENTA) tablet 5 mg, 5 mg, Oral, Daily,  Belva Crome, MD, 5 mg at 08/23/21 1014   loratadine (CLARITIN) tablet 10 mg, 10 mg, Oral, Daily, Belva Crome, MD, 10 mg at 08/23/21 1014   LORazepam (ATIVAN) tablet 0.5 mg, 0.5 mg, Oral, BID PRN, Belva Crome, MD, 0.5 mg at 08/23/21 2101   magic mouthwash, 5 mL, Oral, QID, Belva Crome, MD, 5 mL at 08/23/21 2100   MEDLINE mouth rinse, 15  mL, Mouth Rinse, BID, Belva Crome, MD, 15 mL at 08/23/21 2105   menthol-cetylpyridinium (CEPACOL) lozenge 3 mg, 1 lozenge, Oral, PRN, Belva Crome, MD   montelukast (SINGULAIR) tablet 10 mg, 10 mg, Oral, QHS, Belva Crome, MD, 10 mg at 08/23/21 2100   morphine 2 MG/ML injection 0.5 mg, 0.5 mg, Intravenous, Q4H PRN, Belva Crome, MD, 0.5 mg at 08/22/21 1857   multivitamin with minerals tablet 1 tablet, 1 tablet, Oral, Daily, Belva Crome, MD, 1 tablet at 08/23/21 1018   nitroGLYCERIN (NITROSTAT) SL tablet 0.4 mg, 0.4 mg, Sublingual, Q5 min PRN, Belva Crome, MD, 0.4 mg at 08/19/21 0623   nystatin (MYCOSTATIN/NYSTOP) topical powder, , Topical, TID, Belva Crome, MD, Given at 08/24/21 0911   ondansetron [facility]) injection 4 mg, 4 mg, Intravenous, Q6H PRN, Belva Crome, MD, 4 mg at 08/23/21 2151   oxyCODONE (Oxy IR/ROXICODONE) immediate release tablet 10 mg, 10 mg, Oral, Q6H PRN, Belva Crome, MD, 10 mg at 08/23/21 2057   pantoprazole (PROTONIX) EC tablet 40 mg, 40 mg, Oral, Daily, Belva Crome, MD, 40 mg at 08/23/21 1013   polyethylene glycol (MIRALAX / GLYCOLAX) packet 17 g, 17 g, Oral, TID, Belva Crome, MD, 17 g at 08/23/21 2104   predniSONE (DELTASONE) tablet 20 mg, 20 mg, Oral, Q breakfast, Belva Crome, MD, 20 mg at 08/23/21 0958   promethazine (PHENERGAN) tablet 25 mg, 25 mg, Oral, Q6H PRN, Belva Crome, MD, 25 mg at 08/18/21 1745   protein supplement (ENSURE MAX) liquid, 11 oz, Oral, Daily, Belva Crome, MD, 11 oz at 08/22/21 1000   rosuvastatin (CRESTOR) tablet 20 mg, 20 mg, Oral, Daily, Belva Crome, MD, 20 mg at 08/23/21 1014   senna-docusate (Senokot-S) tablet 2 tablet, 2 tablet, Oral, BID, Belva Crome, MD, 2 tablet at 08/23/21 2100   sertraline (ZOLOFT) tablet 100 mg, 100 mg, Oral, Daily, Belva Crome, MD, 100 mg at 08/23/21 1014   sodium chloride (OCEAN) 0.65 % nasal spray 1 spray, 1 spray, Each Nare, PRN, Belva Crome, MD, 1 spray at 08/13/21 0150    sodium chloride flush (NS) 0.9 % injection 10-40 mL, 10-40 mL, Intracatheter, Q12H, Belva Crome, MD, 10 mL at 08/23/21 2105   sodium chloride flush (NS) 0.9 % injection 10-40 mL, 10-40 mL, Intracatheter, PRN, Belva Crome, MD, 20 mL at 08/18/21 1103   sodium chloride flush (NS) 0.9 % injection 3 mL, 3 mL, Intravenous, Q12H, Belva Crome, MD, 3 mL at 08/23/21 2106   sodium chloride flush (NS) 0.9 % injection 3 mL, 3 mL, Intravenous, PRN, Belva Crome, MD   zinc sulfate capsule 220 mg, 220 mg, Oral, Daily, Belva Crome, MD, 220 mg at 08/23/21 1015   Patients Current Diet:  Diet Order                  Diet Carb Modified Fluid consistency: Thin; Room service appropriate? Yes  Diet effective now  Precautions / Restrictions Precautions Precautions: Fall Precaution Comments: monitor sats/HR Restrictions Weight Bearing Restrictions: No    Has the patient had 2 or more falls or a fall with injury in the past year? Yes   Prior Activity Level Limited Community (1-2x/wk): Pt. went out for accasional appointments   Prior Functional Level Self Care: Did the patient need help bathing, dressing, using the toilet or eating? Independent   Indoor Mobility: Did the patient need assistance with walking from room to room (with or without device)? Independent   Stairs: Did the patient need assistance with internal or external stairs (with or without device)? Needed some help   Functional Cognition: Did the patient need help planning regular tasks such as shopping or remembering to take medications? Needed some help   Patient Information Are you of Hispanic, Latino/a,or Spanish origin?: A. No, not of Hispanic, Latino/a, or Spanish origin What is your race?: A. White Do you need or want an interpreter to communicate with a doctor or health care staff?: 0. No   Patient's Response To:  Health Literacy and Transportation Is the patient able to respond to health  literacy and transportation needs?: Yes Health Literacy - How often do you need to have someone help you when you read instructions, pamphlets, or other written material from your doctor or pharmacy?: Sometimes In the past 12 months, has lack of transportation kept you from medical appointments or from getting medications?: Yes In the past 12 months, has lack of transportation kept you from meetings, work, or from getting things needed for daily living?: Yes   [place] / [place] Devices/Equipment: Oxygen Home Equipment: Rollator (4 wheels), Cane - single point, Tub bench, Grab bars - toilet, Wheelchair - manual   Prior Device Use: Indicate devices/aids used by the patient prior to current illness, exacerbation or injury? Walker   Current Functional Level Cognition   Overall Cognitive Status: Within Functional Limits for tasks assessed Orientation Level: Oriented X4 General Comments: ready to mobilize today    Extremity Assessment (includes Sensation/Coordination)   Upper Extremity Assessment: Overall WFL for tasks assessed  Lower Extremity Assessment: Generalized weakness LLE Deficits / Details: Limited flexion L knee - pt states this has been long term     ADLs   Overall ADL's : Needs assistance/impaired Eating/Feeding: Independent Grooming: Oral care, Wash/dry hands, Wash/dry face, Sitting, Set up Upper Body Bathing: Set up, Sitting Lower Body Bathing: Maximal assistance, Sitting/lateral leans Upper Body Dressing : Moderate assistance, Sitting Lower Body Dressing: Maximal assistance, +2 for safety/equipment, Sit to/from stand Toilet Transfer: Minimal assistance, +2 for safety/equipment, BSC/3in1, Stand-pivot Toileting- Clothing Manipulation and Hygiene: +2 for physical assistance, Total assistance, Sit to/from stand Functional mobility during ADLs: Minimal assistance, +2 for safety/equipment General ADL Comments: Patient required +2 to hand holds to  stand and take steps to recliner. Slightly woblly in standing.     Mobility   Overal bed mobility: Needs Assistance Bed Mobility: Rolling, Sidelying to Sit Rolling: Min assist Sidelying to sit: Min assist Supine to sit: HOB elevated, Mod assist, +2 for safety/equipment General bed mobility comments: Pt up in recliner and requests back to same     Transfers   Overall transfer level: Needs assistance Equipment used: Rolling walker (2 wheels) Transfers: Sit to/from Stand Sit to Stand: Min guard Bed to/from chair/wheelchair/BSC transfer type:: Step pivot Step pivot transfers: Min assist, Mod assist General transfer comment: Steady assist with cues for use of UEs     Ambulation /  Gait / Stairs / Emergency planning/management officer   Ambulation/Gait Ambulation/Gait assistance: Min assist, Min guard, +2 safety/equipment Gait Distance (Feet): 45 Feet (and 35 and 25' with seated rest breaks) Assistive device: Rolling walker (2 wheels) Gait Pattern/deviations: Step-to pattern, Step-through pattern, Decreased step length - right, Decreased step length - left, Shuffle General Gait Details: step pvt bed to chair only - distance restricted by warmed high flow O2 Gait velocity: decr     Posture / Balance Balance Overall balance assessment: Needs assistance Sitting-balance support: Feet supported, No upper extremity supported Sitting balance-Leahy Scale: Good Standing balance support: Bilateral upper extremity supported, Reliant on assistive device for balance, During functional activity Standing balance-Leahy Scale: Poor     Special needs/care consideration Oxygen 3L and Skin ecchymosis on bilateral arms, legs, and abdomen, cracking of bilateral heals, rash on breast and groin; Diabetic Management: Novolog 0-15 units sQ 3x a day and 0-5 units sQ daily at bedtime and Semglee 20 units sQ daily     Previous Home Environment (from acute therapy documentation) Living Arrangements: Other relatives Available Help  at Discharge: Family, Available 24 hours/day Type of Home: House Home Layout: One level Home Access: Stairs to enter Entrance Stairs-Rails: Left, Right Entrance Stairs-Number of Steps: 5 Bathroom Shower/Tub: Optometrist: Yes Home Care Services: No Additional Comments: wears 3L of supplemental O2 at home   Discharge Living Setting Plans for Discharge Living Setting: Patient's home Type of Home at Discharge: House Discharge Home Layout: One level Discharge Home Access: Stairs to enter Entrance Stairs-Rails: Can reach both Entrance Stairs-Number of Steps: 5 Discharge Bathroom Shower/Tub: Tub/shower unit Discharge Bathroom Toilet: Standard Discharge Bathroom Accessibility: Yes How Accessible: Accessible via walker Does the patient have any problems obtaining your medications?: No   Social/Family/Support Systems Patient Roles: Other (Comment) Contact Information: [phone] Anticipated Caregiver: Vic Ripper (neice) Anticipated Caregiver's Contact Information: [phone] Ability/Limitations of Caregiver: Saddie Benders can provide intermittent support, Pt. lives with brother, who is disabled and can provide supervision 24/7, friend also lives with pt. and provides min A Caregiver Availability: 24/7 Discharge Plan Discussed with Primary Caregiver: Yes Is Caregiver In Agreement with Plan?: Yes Does Caregiver/Family have Issues with Lodging/Transportation while Pt is in Rehab?: No   Goals Patient/Family Goal for Rehab: PT/OT Supervision Expected length of stay: 5-7 days Pt/Family Agrees to Admission and willing to participate: Yes Program Orientation Provided & Reviewed with Pt/Caregiver Including Roles  & Responsibilities: Yes   Decrease burden of Care through IP rehab admission: Patient/family education   Possible need for SNF placement upon discharge: not anticipated    Patient Condition: I have reviewed medical records from  [facility]  , spoken with CM, and patient and family member. I discussed via phone for inpatient rehabilitation assessment.  Patient will benefit from ongoing PT and OT, can actively participate in 3 hours of therapy a day 5 days of the week, and can make measurable gains during the admission.  Patient will also benefit from the coordinated team approach during an Inpatient Acute Rehabilitation admission.  The patient will receive intensive therapy as well as Rehabilitation physician, nursing, social worker, and care management interventions.  Due to safety, disease management, medication administration, pain management, and patient education the patient requires 24 hour a day rehabilitation nursing.  The patient is currently  with mobility and basic ADLs.  Discharge setting and therapy post discharge at home with home health is anticipated.  Patient has agreed to participate in the Acute Inpatient Rehabilitation  Program and will admit today.   Preadmission Screen Completed By:  Genella Mech, 08/24/2021 11:07 AM ______________________________________________________________________   Discussed status with Dr. Naaman Plummer on 08/25/20 at 34     and received approval for admission today.   Admission Coordinator:  Genella Mech, CCC-SLP, time 1040/Date 08/25/20    Assessment/Plan: Diagnosis:Debility post Covid PNA Does the need for close, 24 hr/day Medical supervision in concert with the patient's rehab needs make it unreasonable for this patient to be served in a less intensive setting? Yes Co-Morbidities requiring supervision/potential complications: COPD, PAF, Hx Kidney transplant , diabetes Due to bladder management, bowel management, safety, skin/wound care, disease management, medication administration, pain management, and patient education, does the patient require 24 hr/day rehab nursing? Yes Does the patient require coordinated care of a physician, rehab nurse, PT, OT, and SLP to address  physical and functional deficits in the context of the above medical diagnosis(es)? Yes Addressing deficits in the following areas: balance, endurance, locomotion, strength, transferring, bowel/bladder control, bathing, dressing, feeding, grooming, toileting, and psychosocial support Can the patient actively participate in an intensive therapy program of at least 3 hrs of therapy 5 days a week? Yes The potential for patient to make measurable gains while on inpatient rehab is good Anticipated functional outcomes upon discharge from inpatient rehab: supervision PT, supervision OT, n/a SLP Estimated rehab length of stay to reach the above functional goals is: 5-7d Anticipated discharge destination: Home 10. Overall Rehab/Functional Prognosis: good     MD Signature: Charlett Blake M.D. [place] [facility] Fellow Am Acad of Phys Med and Rehab Diplomate Am Board of Electrodiagnostic Med Fellow Am Board of Interventional Pain

## 2021-08-25 NOTE — TOC Transition Note (Signed)
Transition of Care [facility]) - CM/SW Discharge Note   Patient Details  Name: Divinity Kyler MRN: [id number] Date of Birth: 02-23-1956  Transition of Care [facility]) CM/SW Contact:  Ross Ludwig, LCSW Phone Number: 08/25/2021, 11:52 AM   Clinical Narrative:     Patient to be d/c'ed today to [facility] Inpatient Rehab. Patient and family agreeable to plans will transport via [place] RN to call report to [phone].      Final next level of care: IP Rehab Facility Barriers to Discharge: Barriers Resolved   Patient Goals and CMS Choice Patient states their goals for this hospitalization and ongoing recovery are:: To go to inpatient rehab, then return back home. CMS Medicare.gov Compare Post Acute Care list provided to:: Patient Choice offered to / list presented to : Patient  Discharge Placement                       Discharge Plan and Services   Discharge Planning Services: CM Consult                      HH Arranged: PT, OT [facility] Agency: [place] ([facility]) Date [facility] Agency Contacted: 08/20/21 Time [place]: 1307 Representative spoke with at [place]:  Ramond Marrow)  Social Determinants of Health ([place]) Interventions     Readmission Risk Interventions Readmission Risk Prevention Plan 05/14/2021 02/21/2021 01/31/2021  Transportation Screening Complete Complete Complete  HRI or Home Care Consult - - Complete  Social Work Consult for [place] Planning/Counseling - - Complete  Palliative Care Screening - - Not Applicable  Medication Review Press photographer) Complete Complete Complete  PCP or Specialist appointment within 3-5 days of discharge Complete Complete -  [place] or Home Care Consult Complete Complete -  SW Recovery Care/Counseling Consult Complete Complete -  Palliative Care Screening Not Applicable Not Applicable -  [place] Not Applicable Not Applicable -  Some recent data might be hidden

## 2021-08-25 NOTE — Progress Notes (Addendum)
Inpatient Rehabilitation Admission Medication Review by a Pharmacist  A complete drug regimen review was completed for this patient to identify any potential clinically significant medication issues.  High Risk Drug Classes Is patient taking? Indication by Medication  Antipsychotic Yes Compazine for nausea  Anticoagulant Yes Eliquis for afib, h/o CVA  Antibiotic No   Opioid Yes Morphine, Oxycodone for RA/FM w/chronic pain,   Antiplatelet Yes Plavix s/p NSTEMi with CAD, PAD,  Hypoglycemics/insulin Yes Semglee, SSI, Meal coverage, linagliptin, metformin for DM  Vasoactive Medication No   Chemotherapy Yes Azathioprine for kidney transplant  Other Yes Lamictal for neuropathy. Chronic Prednisone+ Azathioprine for kidney transplant      Type of Medication Issue Identified Description of Issue Recommendation(s)  Drug Interaction(s) (clinically significant)     Duplicate Therapy  Phenergan and Compazine D/c Phenergan. Use Compazine as on standard Rehab orders.  Allergy  Morphine allergy listed (2016) Received 1 dose of morphine 08/22/21. Will continue.  No Medication Administration End Date     Incorrect Dose     Additional Drug Therapy Needed  Amaryl Resume Amaryl if glucose dictates  Significant med changes from prior encounter (inform family/care partners about these prior to discharge). D/c Imdur and Metoprolol per inpatient admission d/c summary.   Other  Unknown indication for Lamictal Will try to clarify with providers and patient>> indication clarified for neuropathy    Clinically significant medication issues were identified that warrant physician communication and completion of prescribed/recommended actions by midnight of the next day:  Yes  Name of provider notified for urgent issues identified: Algis Liming, PA  Provider Method of Notification: chat  Pharmacist comments: Unknown indication for Lamictal. Will try to clarify with providers and patient>>> for neuropathy  Time  spent performing this drug regimen review (minutes): 30 min  Darly Massi S. Alford Highland, PharmD, BCPS Clinical Staff Pharmacist [email or web address] [place] 08/25/2021 12:37 PM

## 2021-08-25 NOTE — Progress Notes (Addendum)
[facility], per nurse they will take care of the PICC line when she will arrive at [facility].

## 2021-08-25 NOTE — Progress Notes (Signed)
Patient arrived to unit and oriented to routines. Verbalized willingness to call for assistance. Resting comfortably with call bell in place.

## 2021-08-25 NOTE — TOC Progression Note (Signed)
Transition of Care [facility]) - Progression Note    Patient Details  Name: Truly Stankiewicz MRN: [id number] Date of Birth: 02-14-1956  Transition of Care [facility]) CM/SW Contact  Ross Ludwig, [place] Phone Number: 08/25/2021, 11:50 AM  Clinical Narrative:     Manual [place] screen number received back [id number], expiration 09/25/2021.   Expected Discharge Plan: [place] Barriers to Discharge: Continued Medical Work up  Expected Discharge Plan and Services Expected Discharge Plan: [place]   Discharge Planning Services: CM Consult   Living arrangements for the past 2 months: Single Family Home Expected Discharge Date: 08/25/21                         HH Arranged: PT, OT HH Agency: [place] ([facility]) Date HH Agency Contacted: 08/20/21 Time [place]: 1307 Representative spoke with at [place]:  Ramond Marrow)   Social Determinants of Health ([place]) Interventions    Readmission Risk Interventions Readmission Risk Prevention Plan 05/14/2021 02/21/2021 01/31/2021  Transportation Screening Complete Complete Complete  HRI or [place] - - Complete  Social Work Consult for [place] Planning/Counseling - - Complete  Palliative Care Screening - - Not Applicable  Medication Review Press photographer) Complete Complete Complete  PCP or Specialist appointment within 3-5 days of discharge Complete Complete -  [place] or Home Care Consult Complete Complete -  SW Recovery Care/Counseling Consult Complete Complete -  Palliative Care Screening Not Applicable Not Applicable -  [place] Not Applicable Not Applicable -  Some recent data might be hidden

## 2021-08-25 NOTE — H&P (Signed)
Physical Medicine and Rehabilitation Admission H&P        Chief Complaint  Patient presents with   Functional deficits secondary to Covid infection complicated by NSEMI.       HPI: Katherine [name] is a 66 year old female with history of CAD s/p CABG/PCI- last 07/22, PAD, PAF/torsades Hx- on eliquis, chronic diastolic CHF, OSA--no  CPAP, T2DM with neuropathy, renal transplant, RA/FM w/chronic pain, COPD-3 L oxygen dependent who was admitted to [facility] on 07/25/21 with mild confusion, SOB with hypoxia despite increase in oxygen needs post exposure to flu, low grade fever/chills, diarrhea, pleuritic chest pain and generalized weakness.   She was found to have acute on chronic respiratory failure due to Covid PNA and treated with steriods, Remdisvir and Ceftriaxone. Hospital course significant for weakness with ongoing hypoxia requiring high flow oxygen as well as poorly controlled BS. On 12/24, she developed significant CP despite NTG and morphine. EKG showed diffuse ST and T wave abnormalities,  CXR showed acute on chronic fluid CHF as well as elevation in troponin's due to NSTEMI. She was transitioned to IV heparin, treated diuresis but continued to have rise in troponin with increase in oxygen needs.    Dr. Justine Null was consulted to assist with management of anxiety/depression and she was started on ativan bid in addition to increase in Zolfot to 250 mg/daily without SE.    She was taken to cath lab on 01/05 but found to have stenosis obstructing procedure and procedure aborted. Medical treatment recommended per Dr. Ronney Asters.       ROS         Past Medical History:  Diagnosis Date   Anemia     Anxiety     Bilateral carotid artery stenosis      Carotid duplex 04/3715: 9-67% LICA, 89-38% RICA, >10% RECA, f/u 1 yr suggested   CAD (coronary artery disease) of bypass graft 5/01; 3/'02, 8/'03, 10/'04; 1/15    PCI x 5 to SVG-D1    CAD in native artery 07/1993    3 Vessel Disease (LAD-D1 &  RCA) -- CABG (Dx in setting of inferior STEMI-PTCA of RCA)   CAD S/P percutaneous coronary angioplasty      PCI to SVG-D1 insertion/native D1 x 4 = '01 -(S660 BMS 2.5 x 9 anastomosis- D1); '02 - distal overlap ACS Pixel 2.5 x 8  BMS; '03 distal/native ISR/Thrombosis - Pixel 2.5 x 13; '04 - ISR-  Taxus 2.5 x 20 (covered all);; 1/15 - mid SVG-D1 (50% distal ISR) - Promus P 2.75 x 20 -- 2.8 mm; 6/22: Extensive ISR PTCA & Anastomotic-Native Diag DES PCI (Frontier Onyx 2.25x12 (2.75-2.5 mm post-dilation   COPD mixed type (HCC)      Followed by Dr. Lamonte Sakai "pulmonologist said no COPD"   Depression with anxiety     Diabetes mellitus type 2 in obese ([place])     Diarrhea      started after cholecystectomy and mass removed from intestine   Dyslipidemia, goal LDL below 70      08/2012: TC 137, TG 200, HDL 32!, LDL 45; on statin (followed by Dr.Deterding)   ESRD (end stage renal disease) ([place]) 1991    s/p Cadaveric Renal Transplant ([facility] - Dr. Jimmy Footman)    Family history of adverse reaction to anesthesia      mom's bp dropped during/after anesthesia   Fibromyalgia     GERD (gastroesophageal reflux disease)     Glomerulonephritis, chronic, rapidly progressive 1989   H/O ST elevation  myocardial infarction (STEMI) of inferoposterior wall 07/1993    Rescue PTCA of RCA -- referred for CABG.   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  Had having having lots of nausea and vomiting with poor p.o. intake.  Thought to have QT prolongation with multiple medications involved and hypomagnesemia, hypokalemia.  Tikosyn discontinued along with Zoloft and Phenergan.   Hypertension associated with diabetes ([place])     Mild aortic stenosis by prior  echocardiogram 07/2019    Echo:  Mild aortic stenosis (gradients: Mean 14.3 mmHg -peak 24.9 mmHg).   Morbid obesity (HCC)     MRSA (methicillin resistant staph aureus) culture positive     OSA (obstructive sleep apnea)      no longer on CPAP or home O2, states she doesn't need now after lap band   PAD (peripheral artery disease) ([place]) 08/2013    LEA Dopplers to be read by Dr. Fletcher Anon   PAF (paroxysmal atrial fibrillation) ([place]) 06/2014    Noted on CardioNet Monitor  - --> rhythm control with Tikosyn (Dr. Rayann Heman); converted from warfarin to apixaban for anticoagulation.   Pneumonia     Recurrent boils      Bilateral Groin   Rheumatoid arthritis ([place])      Per Patient Report; associated with OA   S/p cadaver renal transplant 1991    [facility]   Torsades de pointes 05/12/2021   Unstable angina ([place]) 5/01; 3/'02, 8/'03, 10/'04; 1/15    x 5 occurences since Inf-Post STEMI in 1994           Past Surgical History:  Procedure Laterality Date   ABDOMINAL AORTOGRAM N/A 04/21/2018    Procedure: ABDOMINAL AORTOGRAM;  Surgeon: [name], MD;  Location: [place] [facility];  Service: Cardiovascular;  Laterality: N/A;   CATHETER REMOVAL       CHOLECYSTECTOMY N/A 10/29/2014    Procedure: LAPAROSCOPIC CHOLECYSTECTOMY WITH INTRAOPERATIVE CHOLANGIOGRAM;  Surgeon: Excell Seltzer, MD;  Location: [facility];  Service: General;  Laterality: N/A;   CORONARY ANGIOPLASTY   1994    x5   CORONARY ARTERY BYPASS GRAFT   1995    LIMA-LAD, SVG-RPDA, SVG-D1   CORONARY STENT INTERVENTION N/A 02/20/2021    Procedure: PERCUTANEOUS CORONARY STENT INTERVENTION;  Surgeon: [name], MD;  Location: [place] [facility]; ostLCx 60% (Neg RFR 0.96);; SVG- D2 recurrent 90% ISR & 95% native D2 after graft-> DES PCI of 95% anastomotic D2 lesion (Onyx Frontier 2.25 mm x 12 mm => 2.75 mm @ overlap, 2.5 distal.);PTCA of ISR in body of graft. ->  2.5 mm scoring balloon & post-dil w/ 2.75 mm Harvey balloon    ESOPHAGOGASTRODUODENOSCOPY N/A 10/15/2016    Procedure: ESOPHAGOGASTRODUODENOSCOPY (EGD);  Surgeon: Wilford Corner, MD;  Location: [facility] ENDOSCOPY;  Service: Endoscopy;  Laterality: N/A;   I & D EXTREMITY Right 01/29/2018    Procedure: IRRIGATION AND DEBRIDEMENT THUMB;  Surgeon: Dayna Barker, MD;  Location: [place];  Service: Plastics;  Laterality: Right;   INCISE AND DRAIN ABCESS       INTRAVASCULAR PRESSURE WIRE/FFR STUDY N/A 02/20/2021    Procedure: INTRAVASCULAR PRESSURE  WIRE/FFR STUDY;  Surgeon: [name], MD;  Location: [place] [facility];  Service: Cardiovascular;  Laterality: N/A;   KIDNEY TRANSPLANT   1991   KNEE ARTHROSCOPY WITH LATERAL MENISECTOMY Left 12/03/2017    Procedure: LEFT KNEE ARTHROSCOPY WITH LATERAL MENISECTOMY;  Surgeon: Earlie Server, MD;  Location: [place];  Service: Orthopedics;  Laterality: Left;   LAPAROSCOPIC GASTRIC BANDING   04/2004; 10/'09, 2/'10    Port Replacement x 2   LEFT HEART CATH AND CORONARY ANGIOGRAPHY N/A 02/20/2021    Procedure: LEFT HEART CATH AND CORONARY ANGIOGRAPHY;  Surgeon: [name], MD;  Location: [place] [facility];  Service: Cardiovascular;  Laterality: N/A;   LEFT HEART CATH AND CORS/GRAFTS ANGIOGRAPHY N/A 04/21/2018    Procedure: LEFT HEART CATH AND CORS/GRAFTS ANGIOGRAPHY;  Surgeon: [name], MD;  Location: [place] [facility];  Ost-Prox LAD 50% - proxLAD (pre & post D1) 100% CTO. Cx - patent, small OM1 (stable ~ ostial OM1 90%, too small for PCI) & 2 small LPL; Ost-distal RCA 100% CTO.  LIMA-LAD (not injected); SVG-dRCA patent, SVG-D1 - insertion stent ~20% ISR - Severe R CFA disease w/ focal Sub TO   LEFT HEART CATH AND CORS/GRAFTS ANGIOGRAPHY   5/'01, 3/'02, 8/'03, 10/'04; 1/'15    08/22/2013: LAD & RCA 100%; LIMA-LAD & SVG-rPDA patent; Cx-- OM1 60%, OM2 ostial ~50%; SVG-D1 - 80% mid, 50% distal ISR --PCI   LEFT HEART CATH AND CORS/GRAFTS ANGIOGRAPHY N/A 01/31/2021    Procedure: LEFT HEART CATH AND CORS/GRAFTS  ANGIOGRAPHY;  Surgeon: Jolaine Artist, MD;  Location: [facility];;   LEFT HEART CATH AND CORS/GRAFTS ANGIOGRAPHY N/A 08/21/2021    Procedure: LEFT HEART CATH AND CORS/GRAFTS ANGIOGRAPHY;  Surgeon: Belva Crome, MD;  Location: [place] [facility];  Service: Cardiovascular;  Laterality: N/A;   LEFT HEART CATHETERIZATION WITH CORONARY/GRAFT ANGIOGRAM N/A 08/23/2013    Procedure: LEFT HEART CATHETERIZATION WITH Beatrix Fetters;  Surgeon: Wellington Hampshire, MD;  Location: [place] CATH LAB;  Service: Cardiovascular;  Laterality: N/A;   Lower Extremity Arterial Dopplers   08/2013    ABI: R 0.96, L 1.04   MULTIPLE TOOTH EXTRACTIONS   age 62   NM MYOVIEW LTD   03/2016    EF 62%. LOW RISK. C/W prior MI - no Ischemia. Apical hypokinesis.   PERCUTANEOUS CORONARY STENT INTERVENTION (PCI-S)   5/'01, 3/'02, 8/'03, 10/'04;    '01 - S660 BMS 2.5 x 9 - dSVG-D1 into D1; '02- post-stent stenosis - 2.5 x 8 Pixel BMS; '8\03: ISR/Thrombosis into native D1 - AngioJet, 2.5 x 13 Pixel; '04 - ISR 95% - covered stented area with Taxus DES 2.5 mm x 20 (2.88)   PERCUTANEOUS CORONARY STENT INTERVENTION (PCI-S)   08/23/2013    Procedure: PERCUTANEOUS CORONARY STENT INTERVENTION (PCI-S);  Surgeon: Wellington Hampshire, MD;  Location: [facility] CATH LAB;  Service: Cardiovascular;;mid SVG-D1 80%; distal stent ~50% ISR; Promus [place] DES 2.75 mm xc 20 mm (2.8 mm)   PORT-A-CATH REMOVAL        kidney   TRANSTHORACIC ECHOCARDIOGRAM   07/2019    a) 07/2019: EF 55 to 60%.  No LVH.  Paradoxical septal WM-s/p CABG.  GRII DD.  Nl RV size and fxn.  Mild bilateral atrial dilation.  Mod MAC.  Trace MR.  Mild AS (gradients: Mean 14.3 mmHg -peak 24.9 mmHg).; B) 06/2020: EF 40 to 45%.  Moderate concentric LVH.  GRII DD.  Elevated LAP.  Mod HK mid Apical  Ant-AntSept wall & mild Apical Dyskinesis.  Mod LA dilation.  Mild MR.  AoV sclerosis w/o AS.   TRANSTHORACIC ECHOCARDIOGRAM   01/30/2021    EF 55 to 60%.  Mild LVH.  GR 1 DD.  Elevated LAP.   Moderate LA dilation.  Mild MR with mild MS.  Mild aortic valve stenosis.:   TUBAL LIGATION       wrist fistula repair Left      dialysis for one year           Family History  Problem Relation Age of Onset   Cancer Mother          liver   Heart disease Father     Cancer Father          colon   Arrhythmia Brother          Atrial Fibrillation   Arrhythmia Paternal Aunt          Atrial Fibrillation      Social History:  reports that she has been smoking cigarettes. She has a 30.00 pack-year smoking history. She has never used smokeless tobacco. She reports that she does not drink alcohol and does not use drugs. Allergies:           Allergies  Allergen Reactions   Tetracycline Hives      Patient tolerated Doxycycline Dec 2020   Niacin Other (See Comments)      Mouth blisters   Niaspan [Niacin Er] Other (See Comments)      Mouth blisters   Sulfa Antibiotics Nausea Only and Other (See Comments)      "Tears up stomach"   Sulfonamide Derivatives Other (See Comments)      Reaction: per patient "tears her stomach up"   Codeine Nausea And Vomiting   Erythromycin Nausea And Vomiting   Hydromorphone Hcl Nausea And Vomiting   Morphine And Related Nausea And Vomiting   Nalbuphine Nausea And Vomiting      Nubain   Sulfasalazine Nausea Only and Other (See Comments)      per patient "tears her stomach up", "Tears up stomach"   Tape Rash and Other (See Comments)      No "plastic" tape," please----cloth tape only            Medications Prior to Admission  Medication Sig Dispense Refill   acetaminophen (TYLENOL) 500 MG tablet Take 1,000 mg by mouth daily.       albuterol (VENTOLIN HFA) 108 (90 Base) MCG/ACT inhaler TAKE 2 PUFFS BY MOUTH EVERY 6 HOURS AS NEEDED FOR WHEEZE OR SHORTNESS OF BREATH (Patient taking differently: 2 puffs every 6 (six) hours as needed for wheezing or shortness of breath.) 18 each 3   apixaban (ELIQUIS) 5 MG TABS tablet Take 1 tablet (5 mg total) by mouth 2  (two) times daily. APPOINTMENT NEEDED WITH CARDIOLOGIST FOR FURTHER REFILLS (Patient taking differently: Take 5 mg by mouth 2 (two) times daily.) 60 tablet 0   azaTHIOprine (IMURAN) 50 MG tablet Take 125 mg by mouth See admin instructions. Take 2 1/2 tablets (125 mg) by mouth daily at 3pm       budesonide (PULMICORT) 0.5 MG/2ML nebulizer solution Take 2 mLs (0.5 mg total) by nebulization 2 (two) times daily. 360 mL 1   calcitRIOL (ROCALTROL) 0.25 MCG capsule Take 0.25 mcg by mouth every 3 (three) days.        cetirizine (ZYRTEC) 5 MG tablet Take 5 mg by mouth daily.  clopidogrel (PLAVIX) 75 MG tablet Take 1 tablet (75 mg total) by mouth daily. 90 tablet 2   fluticasone (FLONASE) 50 MCG/ACT nasal spray PLACE 2 SPRAYS INTO BOTH NOSTRILS 2 TIMES DAILY. (Patient taking differently: 2 sprays 2 (two) times daily as needed for allergies.) 96 mL 0   glimepiride (AMARYL) 4 MG tablet Take 4 mg by mouth daily.       insulin aspart (NOVOLOG FLEXPEN) 100 UNIT/ML FlexPen Inject 4-10 Units into the skin 3 (three) times daily with meals. Per sliding scale :not provided       isosorbide mononitrate (IMDUR) 60 MG 24 hr tablet Take 1 tablet (60 mg total) by mouth daily. 30 tablet 0   lamoTRIgine (LAMICTAL) 25 MG tablet TAKE 1 TABLET BY MOUTH EVERYDAY AT BEDTIME (Patient taking differently: Take 25 mg by mouth at bedtime.) 90 tablet 1   metFORMIN (GLUCOPHAGE) 500 MG tablet Take 1 tablet (500 mg total) by mouth 2 (two) times daily. 30 tablet 6   metoprolol tartrate (LOPRESSOR) 100 MG tablet Take 1 tablet (100 mg total) by mouth 2 (two) times daily. Appointment Required For Further Refills [phone] 60 tablet 0   montelukast (SINGULAIR) 10 MG tablet TAKE 1 TABLET BY MOUTH EVERYDAY AT BEDTIME (Patient taking differently: Take 10 mg by mouth at bedtime.) 90 tablet 0   nitroGLYCERIN (NITROSTAT) 0.4 MG SL tablet Place 1 tablet (0.4 mg total) under the tongue every 5 (five) minutes as needed for chest pain. 25 tablet 6    Oxycodone HCl 10 MG TABS Take 1 tablet (10 mg total) by mouth 4 (four) times daily as needed. (Patient taking differently: Take 10 mg by mouth 4 (four) times daily as needed (pain).) 100 tablet 0   OXYGEN Inhale 3 L into the lungs continuous.       pantoprazole (PROTONIX) 40 MG tablet TAKE 1 TABLET BY MOUTH EVERY DAY (Patient taking differently: 40 mg daily.) 15 tablet 0   predniSONE (DELTASONE) 5 MG tablet Take 5 mg by mouth every morning.       promethazine (PHENERGAN) 25 MG tablet Take 25 mg by mouth every 6 (six) hours as needed for nausea or vomiting.       rosuvastatin (CRESTOR) 20 MG tablet TAKE 1 TABLET BY MOUTH EVERY DAY (Patient taking differently: Take 20 mg by mouth daily.) 30 tablet 4   sertraline (ZOLOFT) 100 MG tablet Take 1 tablet (100 mg total) by mouth daily. 90 tablet 2   [DISCONTINUED] cyclobenzaprine (FLEXERIL) 10 MG tablet TAKE 2 TAB DAILY AT BEDTIME, MAY ALSO TAKE 1 TABLET BY MOUTH AT NOON AS NEEDED FOR MUSCLE SPASMS 90 tablet 2   [DISCONTINUED] Insulin Glargine (BASAGLAR KWIKPEN) 100 UNIT/ML SOPN Inject 12 Units into the skin daily before breakfast.       [DISCONTINUED] LORazepam (ATIVAN) 1 MG tablet Take 1 tablet (1 mg total) by mouth 2 (two) times daily. 60 tablet 5   arformoterol (BROVANA) 15 MCG/2ML NEBU Take 2 mLs (15 mcg total) by nebulization 2 (two) times daily. 120 mL 12   clotrimazole (MYCELEX) 10 MG troche Take 1 tablet (10 mg total) by mouth 5 (five) times daily as needed (thrush). (Patient not taking: Reported on 06/19/2021)       magnesium oxide (MAG-OX) 400 (240 Mg) MG tablet Take 1 tablet (400 mg total) by mouth daily. (Patient not taking: Reported on 05/07/2021) 30 tablet 0   Respiratory Therapy Supplies (NEBULIZER) DEVI Pt needs nebulizr machine dx J44.9 1 each 0  Drug Regimen Review  Drug regimen was reviewed and remains appropriate with no significant issues identified   Home: Home Living Family/patient expects to be discharged to:: Private  residence Living Arrangements: Other relatives Available Help at Discharge: Family, Available 24 hours/day Type of Home: House Home Access: Stairs to enter [employer] of Steps: 5 Entrance Stairs-Rails: Left, Right Home Layout: One level Bathroom Shower/Tub: Chiropodist: Standard Bathroom Accessibility: Yes Home Equipment: Rollator (4 wheels), Cane - single point, Tub bench, Grab bars - toilet, Wheelchair - manual Additional Comments: wears 3L of supplemental O2 at home   Functional History: Prior Function Prior Level of Function : Independent/Modified Independent   Functional Status:  Mobility: Bed Mobility Overal bed mobility: Needs Assistance Bed Mobility: Rolling, Sidelying to Sit Rolling: Min assist Sidelying to sit: Min assist Supine to sit: HOB elevated, Mod assist, +2 for safety/equipment General bed mobility comments: Pt up in recliner and requests back to same Transfers Overall transfer level: Needs assistance Equipment used: Rolling walker (2 wheels) Transfers: Sit to/from Stand Sit to Stand: Min guard Bed to/from chair/wheelchair/BSC transfer type:: Step pivot Step pivot transfers: Min assist, Mod assist General transfer comment: Steady assist with cues for use of UEs Ambulation/Gait Ambulation/Gait assistance: Min assist, Min guard, +2 safety/equipment Gait Distance (Feet): 45 Feet (and 35 and 25' with seated rest breaks) Assistive device: Rolling walker (2 wheels) Gait Pattern/deviations: Step-to pattern, Step-through pattern, Decreased step length - right, Decreased step length - left, Shuffle General Gait Details: step pvt bed to chair only - distance restricted by warmed high flow O2 Gait velocity: decr   ADL: ADL Overall ADL's : Needs assistance/impaired Eating/Feeding: Independent Grooming: Oral care, Wash/dry hands, Wash/dry face, Sitting, Set up Upper Body Bathing: Set up, Sitting Lower Body Bathing: Maximal  assistance, Sitting/lateral leans Upper Body Dressing : Moderate assistance, Sitting Lower Body Dressing: Maximal assistance, +2 for safety/equipment, Sit to/from stand Toilet Transfer: Minimal assistance, +2 for safety/equipment, BSC/3in1, Stand-pivot Toileting- Clothing Manipulation and Hygiene: +2 for physical assistance, Total assistance, Sit to/from stand Functional mobility during ADLs: Minimal assistance, +2 for safety/equipment General ADL Comments: Patient required +2 to hand holds to stand and take steps to recliner. Slightly woblly in standing.   Cognition: Cognition Overall Cognitive Status: Within Functional Limits for tasks assessed Orientation Level: Oriented X4 Cognition Arousal/Alertness: Awake/alert Behavior During Therapy: WFL for tasks assessed/performed Overall Cognitive Status: Within Functional Limits for tasks assessed General Comments: ready to mobilize today   Physical Exam: Blood pressure 140/74, pulse 76, temperature 98.7 F (37.1 C), temperature source Oral, resp. rate 20, height _0  (1.549 m), weight 88.9 kg, SpO2 94 %. Physical Exam   General: No acute distress Mood and affect are appropriate Heart: Regular rate and rhythm no rubs murmurs or extra sounds Lungs: Clear to auscultation, breathing unlabored, no rales or wheezes Abdomen: Positive bowel sounds, soft nontender to palpation, nondistended Extremities: No clubbing, cyanosis, or edema Skin: No evidence of breakdown, sacral shearing and MASD   Neurologic: Cranial nerves II through XII intact, motor strength is 5/5 in bilateral deltoid, bicep, tricep, grip, 4-/5 hip flexor, knee extensors, 5/5 ankle dorsiflexor and plantar flexor Sensory exam normal sensation to light touch and proprioception in bilateral upper and lower extremities Cerebellar exam normal finger to nose to finger as well as heel to shin in bilateral upper and lower extremities Musculoskeletal: Full range of motion in all 4  extremities. No joint swelling  Lab Results Last 48 Hours  Results for orders placed or performed during the hospital encounter of 08/04/21 (from the past 48 hour(s))  Glucose, capillary     Status: Abnormal    Collection Time: 08/23/21 11:39 AM  Result Value Ref Range    Glucose-Capillary 237 (H) 70 - 99 mg/dL      Comment: Glucose reference range applies only to samples taken after fasting for at least 8 hours.    Comment 1 Notify RN      Comment 2 Document in Chart    Glucose, capillary     Status: Abnormal    Collection Time: 08/23/21  4:12 PM  Result Value Ref Range    Glucose-Capillary 245 (H) 70 - 99 mg/dL      Comment: Glucose reference range applies only to samples taken after fasting for at least 8 hours.    Comment 1 Notify RN      Comment 2 Document in Chart    Glucose, capillary     Status: Abnormal    Collection Time: 08/23/21  9:12 PM  Result Value Ref Range    Glucose-Capillary 217 (H) 70 - 99 mg/dL      Comment: Glucose reference range applies only to samples taken after fasting for at least 8 hours.    Comment 1 Notify RN      Comment 2 Document in Chart    Basic metabolic panel     Status: Abnormal    Collection Time: 08/24/21  5:00 AM  Result Value Ref Range    Sodium 137 135 - 145 mmol/L    Potassium 4.9 3.5 - 5.1 mmol/L      Comment: DELTA CHECK NOTED NO VISIBLE HEMOLYSIS      Chloride 96 (L) 98 - 111 mmol/L    CO2 30 22 - 32 mmol/L    Glucose, Bld 254 (H) 70 - 99 mg/dL      Comment: Glucose reference range applies only to samples taken after fasting for at least 8 hours.    BUN 20 8 - 23 mg/dL    Creatinine, Ser 0.81 0.44 - 1.00 mg/dL    Calcium 9.4 8.9 - 10.3 mg/dL    GFR, Estimated >60 >60 mL/min      Comment: (NOTE) Calculated using the CKD-EPI Creatinine Equation (2021)      Anion gap 11 5 - 15      Comment: Performed at [facility], [place] [address] [place], [place] [address]  Glucose, capillary     Status: Abnormal     Collection Time: 08/24/21  7:59 AM  Result Value Ref Range    Glucose-Capillary 295 (H) 70 - 99 mg/dL      Comment: Glucose reference range applies only to samples taken after fasting for at least 8 hours.    Comment 1 Notify RN      Comment 2 Document in Chart    Glucose, capillary     Status: Abnormal    Collection Time: 08/24/21 12:05 PM  Result Value Ref Range    Glucose-Capillary 227 (H) 70 - 99 mg/dL      Comment: Glucose reference range applies only to samples taken after fasting for at least 8 hours.    Comment 1 Notify RN      Comment 2 Document in Chart    Glucose, capillary     Status: Abnormal    Collection Time: 08/24/21  4:15 PM  Result Value Ref Range    Glucose-Capillary 132 (H) 70 - 99  mg/dL      Comment: Glucose reference range applies only to samples taken after fasting for at least 8 hours.    Comment 1 Notify RN      Comment 2 Document in Chart    Glucose, capillary     Status: Abnormal    Collection Time: 08/24/21  9:30 PM  Result Value Ref Range    Glucose-Capillary 166 (H) 70 - 99 mg/dL      Comment: Glucose reference range applies only to samples taken after fasting for at least 8 hours.  Glucose, capillary     Status: Abnormal    Collection Time: 08/25/21  9:00 AM  Result Value Ref Range    Glucose-Capillary 306 (H) 70 - 99 mg/dL      Comment: Glucose reference range applies only to samples taken after fasting for at least 8 hours.    *Note: Due to a large number of results and/or encounters for the requested time period, some results have not been displayed. A complete set of results can be found in Results Review.      Imaging Results (Last 48 hours)  No results found.           Medical Problem List and Plan: 1. Functional deficits secondary to debility from bacterial and viral PNA community acquired             -patient may  shower             -ELOS/Goals: 5-7d 2.  Antithrombotics: -DVT/anticoagulation:  Pharmaceutical: Other (comment)  Eliquis for Afib             -antiplatelet therapy: Plavix 3. Pain Management: OxyIR 72m QID, flexeril 111mTID prn spasms 4. Mood: Ativan, 0.3m56mID, Zoloft 100m73m             -antipsychotic agents: NA 5. Neuropsych: This patient is capable of making decisions on her own behalf. 6. Skin/Wound Care: MASD , probable mild candida and shearing , barrier cream, nystatin topical , transfer training  7. Fluids/Electrolytes/Nutrition: Monitor I/Os  8.  COPD prn alb inh, Brovana BID, Pulmicort nebs BID, Montelukast  9.  DM glimipride, metformin, insulin glargine  10.  Hx ESRD, renal transplant 1991, Imuran, prednisone wean from 13mg67m3mg c91mge Q3d  11.  HTN- metoprolol 12.  HLD , PAD crestor, clopidigrel 13.  Constipation due to opioids and immobility- senna-S, Miralax, Linzess    PamelaBary Leriche 08/25/2021  "I have personally performed a face to face diagnostic evaluation of this patient.  Additionally, I have reviewed and concur with the physician assistant's documentation above." AndrewCharlett BlakeCone [place] Fellow Am [employer] Diplomate Am Board of Electrodiagnostic Med Fellow Am Board of Interventional Pain

## 2021-08-25 NOTE — Progress Notes (Signed)
Occupational Therapy Treatment Patient Details Name: Katherine [name] MRN: [id number] DOB: 09-23-1955 Today's Date: 08/25/2021   History of present illness Pt admitted from home 2* SOB with COVID PNA and with hx of CAD, CABG, PAD, PAF, COPD, Fibromyalgia, R eye CVA with residual blindness, kidney transplant and obesity   OT comments  Patient is motivated to transition to CIR today. Patient was able to don disposable shirt with min A and pants with max A. Patient noted to complete toileting tasks with min A as well on this date with 3 in 1 commode. Patient reported discomfort in chest at end of session with nurse made aware.  Nurse in room upon exit.   Recommendations for follow up therapy are one component of a multi-disciplinary discharge planning process, led by the attending physician.  Recommendations may be updated based on patient status, additional functional criteria and insurance authorization.    Follow Up Recommendations  Acute inpatient rehab (3hours/day)    Assistance Recommended at Discharge Frequent or constant Supervision/Assistance  Patient can return home with the following      Equipment Recommendations  Other (comment) (defer to next venue)    Recommendations for Other Services      Precautions / Restrictions Precautions Precautions: Fall Precaution Comments: monitor sats/HR Restrictions Weight Bearing Restrictions: No       Mobility Bed Mobility Overal bed mobility: Needs Assistance     Sidelying to sit: Min assist;HOB elevated            Transfers                         Balance                                           ADL either performed or assessed with clinical judgement   ADL Overall ADL's : Needs assistance/impaired         Upper Body Bathing: Set up;Sitting   Lower Body Bathing: Maximal assistance;Sitting/lateral leans   Upper Body Dressing : Minimal assistance;Sitting   Lower Body Dressing:  Maximal assistance;Sit to/from stand   Toilet Transfer: Minimal assistance;Stand-pivot;BSC/3in1   Toileting- Clothing Manipulation and Hygiene: Moderate assistance;Sit to/from stand              Extremity/Trunk Assessment              Vision       Perception     Praxis      Cognition Arousal/Alertness: Awake/alert Behavior During Therapy: WFL for tasks assessed/performed Overall Cognitive Status: Within Functional Limits for tasks assessed                                            Exercises     Shoulder Instructions       General Comments      Pertinent Vitals/ Pain       Pain Assessment: Faces Pain Location: discomfort reported in chest at end of session Pain Descriptors / Indicators: Discomfort Pain Intervention(s): Other (comment);Monitored during session (RN in room at time therapist left.)  Home Living  Prior Functioning/Environment              Frequency  Min 2X/week        Progress Toward Goals  OT Goals(current goals can now be found in the care plan section)  Progress towards OT goals: Progressing toward goals     Plan Discharge plan remains appropriate    Co-evaluation                 AM-PAC OT "6 Clicks" Daily Activity     Outcome Measure   Help from another person eating meals?: None Help from another person taking care of personal grooming?: A Little Help from another person toileting, which includes using toliet, bedpan, or urinal?: A Lot Help from another person bathing (including washing, rinsing, drying)?: A Lot Help from another person to put on and taking off regular upper body clothing?: A Little Help from another person to put on and taking off regular lower body clothing?: A Lot 6 Click Score: 16    End of Session Equipment Utilized During Treatment: Rolling walker (2 wheels);Oxygen  OT Visit Diagnosis: Muscle weakness  (generalized) (M62.81)   Activity Tolerance Patient limited by fatigue   Patient Left in bed;with call bell/phone within reach;with bed alarm set   Nurse Communication Mobility status        Time: 2202-5427 OT Time Calculation (min): 29 min  Charges: OT Treatments $Self Care/Home Management : 23-37 mins  Jackelyn Poling OTR/L, MS Acute Rehabilitation Department Office# [phone] Pager# [phone]   Marcellina Millin 08/25/2021, 12:10 PM

## 2021-08-25 NOTE — Progress Notes (Signed)
Inpatient Rehab Admissions Coordinator:   I have a CIR bed for this pt. Today. RN may call [phone] for report.   Clemens Catholic, [place], [facility] Admissions Coordinator  [phone] ([place]) [phone] (office)

## 2021-08-26 DIAGNOSIS — R5381 Other malaise: Secondary | ICD-10-CM | POA: Diagnosis not present

## 2021-08-26 LAB — GLUCOSE, CAPILLARY
Glucose-Capillary: 135 mg/dL — ABNORMAL HIGH (ref 70–99)
Glucose-Capillary: 172 mg/dL — ABNORMAL HIGH (ref 70–99)
Glucose-Capillary: 289 mg/dL — ABNORMAL HIGH (ref 70–99)
Glucose-Capillary: 309 mg/dL — ABNORMAL HIGH (ref 70–99)
Glucose-Capillary: 402 mg/dL — ABNORMAL HIGH (ref 70–99)

## 2021-08-26 LAB — CBC WITH DIFFERENTIAL/PLATELET
Abs Immature Granulocytes: 0.08 10*3/uL — ABNORMAL HIGH (ref 0.00–0.07)
Basophils Absolute: 0 10*3/uL (ref 0.0–0.1)
Basophils Relative: 0 %
Eosinophils Absolute: 0.1 10*3/uL (ref 0.0–0.5)
Eosinophils Relative: 2 %
HCT: 29.9 % — ABNORMAL LOW (ref 36.0–46.0)
Hemoglobin: 9.5 g/dL — ABNORMAL LOW (ref 12.0–15.0)
Immature Granulocytes: 1 %
Lymphocytes Relative: 17 %
Lymphs Abs: 1.2 10*3/uL (ref 0.7–4.0)
MCH: 32 pg (ref 26.0–34.0)
MCHC: 31.8 g/dL (ref 30.0–36.0)
MCV: 100.7 fL — ABNORMAL HIGH (ref 80.0–100.0)
Monocytes Absolute: 0.8 10*3/uL (ref 0.1–1.0)
Monocytes Relative: 11 %
Neutro Abs: 4.9 10*3/uL (ref 1.7–7.7)
Neutrophils Relative %: 69 %
Platelets: 97 10*3/uL — ABNORMAL LOW (ref 150–400)
RBC: 2.97 MIL/uL — ABNORMAL LOW (ref 3.87–5.11)
RDW: 15.5 % (ref 11.5–15.5)
WBC: 7.1 10*3/uL (ref 4.0–10.5)
nRBC: 0.4 % — ABNORMAL HIGH (ref 0.0–0.2)

## 2021-08-26 LAB — COMPREHENSIVE METABOLIC PANEL
ALT: 23 U/L (ref 0–44)
AST: 21 U/L (ref 15–41)
Albumin: 2.5 g/dL — ABNORMAL LOW (ref 3.5–5.0)
Alkaline Phosphatase: 144 U/L — ABNORMAL HIGH (ref 38–126)
Anion gap: 7 (ref 5–15)
BUN: 24 mg/dL — ABNORMAL HIGH (ref 8–23)
CO2: 31 mmol/L (ref 22–32)
Calcium: 8.8 mg/dL — ABNORMAL LOW (ref 8.9–10.3)
Chloride: 97 mmol/L — ABNORMAL LOW (ref 98–111)
Creatinine, Ser: 0.81 mg/dL (ref 0.44–1.00)
GFR, Estimated: >60 mL/min (ref >60)
Glucose, Bld: 174 mg/dL — ABNORMAL HIGH (ref 70–99)
Potassium: 3.8 mmol/L (ref 3.5–5.1)
Sodium: 135 mmol/L (ref 135–145)
Total Bilirubin: 0.6 mg/dL (ref 0.3–1.2)
Total Protein: 5.4 g/dL — ABNORMAL LOW (ref 6.5–8.1)

## 2021-08-26 MED ORDER — TERBINAFINE HCL 1 % EX CREA
TOPICAL_CREAM | Freq: Two times a day (BID) | CUTANEOUS | Status: DC
  Filled 2021-08-26: qty 12

## 2021-08-26 MED ORDER — HYDROCERIN EX CREA
TOPICAL_CREAM | Freq: Two times a day (BID) | CUTANEOUS | Status: DC
  Filled 2021-08-26: qty 113

## 2021-08-26 NOTE — Progress Notes (Signed)
PROGRESS NOTE   Subjective/Complaints:  Patient gets fatigued after therapy, had a mild desat to 88 with therapy.  Discussed blood sugars with patient.  We discussed her usual prednisone dose at home which is usually 5 mg and that she is at 20 mg and this is likely causing the problem with her diabetic control at this time.  We discussed that she is on a tapering dose of prednisone. The patient did have a good bowel movement today Review of systems negative for chest pain shortness of breath nausea vomiting diarrhea   Objective:   No results found. Recent Labs    08/26/21 0311  WBC 7.1  HGB 9.5*  HCT 29.9*  PLT 97*   Recent Labs    08/24/21 0500 08/25/21 1824 08/26/21 0311  NA 137  --  135  K 4.9  --  3.8  CL 96*  --  97*  CO2 30  --  31  GLUCOSE 254* 413* 174*  BUN 20  --  24*  CREATININE 0.81  --  0.81  CALCIUM 9.4  --  8.8*    Intake/Output Summary (Last 24 hours) at 08/26/2021 5038 Last data filed at 08/26/2021 0500 Gross per 24 hour  Intake 500 ml  Output --  Net 500 ml        Physical Exam: Vital Signs Blood pressure (!) 125/59, pulse 80, temperature 98.6 F (37 C), temperature source Oral, resp. rate 20, height 5\' 1"  (1.549 m), weight 90.2 kg, SpO2 96 %.  General: No acute distress Mood and affect are appropriate Heart: Regular rate and rhythm no rubs murmurs or extra sounds Lungs: Clear to auscultation, breathing unlabored, no rales or wheezes Abdomen: Positive bowel sounds, soft nontender to palpation, nondistended Extremities: No clubbing, cyanosis, or edema Skin: Sacral rash Neurologic: Cranial nerves II through XII intact, motor strength is 4/5 in bilateral deltoid, bicep, tricep, grip, hip flexor, knee extensors, ankle dorsiflexor and plantar flexor   Musculoskeletal: Full range of motion in all 4 extremities. No joint swelling    Assessment/Plan: 1. Functional deficits which require  3+ hours per day of interdisciplinary therapy in a comprehensive inpatient rehab setting. Physiatrist is providing close team supervision and 24 hour management of active medical problems listed below. Physiatrist and rehab team continue to assess barriers to discharge/monitor patient progress toward functional and medical goals  Care Tool:  Bathing              Bathing assist       Upper Body Dressing/Undressing Upper body dressing   What is the patient wearing?: Pull over shirt (personal gown)    Upper body assist Assist Level: Contact Guard/Touching assist    Lower Body Dressing/Undressing Lower body dressing      What is the patient wearing?: Pants     Lower body assist Assist for lower body dressing: Contact Guard/Touching assist     Toileting Toileting    Toileting assist Assist for toileting: Moderate Assistance - Patient 50 - 74%     Transfers Chair/bed transfer  Transfers assist     Chair/bed transfer assist level: Minimal Assistance - Patient > 75%     Locomotion Ambulation  Ambulation assist              Walk 10 feet activity   Assist           Walk 50 feet activity   Assist           Walk 150 feet activity   Assist           Walk 10 feet on uneven surface  activity   Assist           Wheelchair     Assist               Wheelchair 50 feet with 2 turns activity    Assist            Wheelchair 150 feet activity     Assist          Blood pressure (!) 125/59, pulse 80, temperature 98.6 F (37 C), temperature source Oral, resp. rate 20, height 5\' 1"  (1.549 m), weight 90.2 kg, SpO2 96 %.    Medical Problem List and Plan: 1. Functional deficits secondary to debility from bacterial and viral PNA community acquired             -patient may  shower             -ELOS/Goals: 5-7d, team conference in a.m. 2.  Antithrombotics: -DVT/anticoagulation:  Pharmaceutical: Other (comment)  Eliquis for Afib             -antiplatelet therapy: Plavix 3. Pain Management: OxyIR 10mg  QID, flexeril 10mg  TID prn spasms 4. Mood: Ativan, 0.5mg  BID, Zoloft 100mg  qd             -antipsychotic agents: NA 5. Neuropsych: This patient is capable of making decisions on her own behalf. 6. Skin/Wound Care: MASD , probable mild candida and shearing , barrier cream, nystatin topical , transfer training  7. Fluids/Electrolytes/Nutrition: Monitor I/Os  8.  COPD prn alb inh, Brovana BID, Pulmicort nebs BID, Montelukast  9.  DM glimipride, metformin, insulin glargine 20U qam, Novalog 8U TID ac CBG (last 3)  Recent Labs    08/25/21 1718 08/25/21 2058 08/26/21 0553  GLUCAP 447* 298* 172*  Uncontrolled , am CBG fair, should improve with prednisone taper   10.  Hx ESRD, renal transplant 1991, Imuran, prednisone wean from 15mg  to 5mg  change Q3d  11.  HTN- metoprolol Vitals:   08/26/21 0015 08/26/21 0345  BP: 115/65 (!) 125/59  Pulse: 75 80  Resp: 20 20  Temp:  98.6 F (37 C)  SpO2: 94% 96%  Controlled 1/10  12.  HLD , PAD crestor, clopidigrel 13.  Constipation due to opioids and immobility- senna-S, Miralax, Linzess      LOS: 1 days A FACE TO FACE EVALUATION WAS PERFORMED  Charlett Blake 08/26/2021, 9:27 AM

## 2021-08-26 NOTE — Progress Notes (Signed)
[facility] Individual Statement of Services  Patient Name:  Katherine [name]  Date:  08/26/2021  Welcome to the [facility].  Our goal is to provide you with an individualized program based on your diagnosis and situation, designed to meet your specific needs.  With this comprehensive rehabilitation program, you will be expected to participate in at least 3 hours of rehabilitation therapies Monday-Friday, with modified therapy programming on the weekends.  Your rehabilitation program will include the following services:  Physical Therapy (PT), Occupational Therapy (OT), 24 hour per day rehabilitation nursing, Care Coordinator, Rehabilitation Medicine, Nutrition Services, and Pharmacy Services  Weekly team conferences will be held on Tuesday to discuss your progress.  Your Inpatient Rehabilitation Care Coordinator will talk with you frequently to get your input and to update you on team discussions.  Team conferences with you and your family in attendance may also be held.  Expected length of stay: 7-10 days  Overall anticipated outcome: independent-supervision level  Depending on your progress and recovery, your program may change. Your Inpatient Rehabilitation Care Coordinator will coordinate services and will keep you informed of any changes. Your Inpatient Rehabilitation Care Coordinator's name and contact numbers are listed  below.  The following services may also be recommended but are not provided by the [facility]:   [place] will be made to provide these services after discharge if needed.  Arrangements include referral to agencies that provide these services.  Your insurance has been verified to be:  Medicare & medicaid Your primary doctor is:  Scarlette Calico  Pertinent information will be shared with your doctor and your insurance  company.  Inpatient Rehabilitation Care Coordinator:  Ovidio Kin, [place] or Emilia Beck  Information discussed with and copy given to patient by: Elease Hashimoto, 08/26/2021, 10:16 AM

## 2021-08-26 NOTE — Plan of Care (Signed)
°  Problem: RH Balance Goal: LTG Patient will maintain dynamic standing balance (PT) Description: LTG:  Patient will maintain dynamic standing balance with assistance during mobility activities (PT) Flowsheets (Taken 08/26/2021 1241) LTG: Pt will maintain dynamic standing balance during mobility activities with:: (LRAD) Independent with assistive device    Problem: Sit to Stand Goal: LTG:  Patient will perform sit to stand with assistance level (PT) Description: LTG:  Patient will perform sit to stand with assistance level (PT) Flowsheets (Taken 08/26/2021 1241) LTG: PT will perform sit to stand in preparation for functional mobility with assistance level: (LRAD) Independent with assistive device   Problem: RH Bed to Chair Transfers Goal: LTG Patient will perform bed/chair transfers w/assist (PT) Description: LTG: Patient will perform bed to chair transfers with assistance (PT). Flowsheets (Taken 08/26/2021 1241) LTG: Pt will perform Bed to Chair Transfers with assistance level: (LRAD) Independent with assistive device    Problem: RH Car Transfers Goal: LTG Patient will perform car transfers with assist (PT) Description: LTG: Patient will perform car transfers with assistance (PT). Flowsheets (Taken 08/26/2021 1241) LTG: Pt will perform car transfers with assist:: (LRAD) Set up assist    Problem: RH Ambulation Goal: LTG Patient will ambulate in controlled environment (PT) Description: LTG: Patient will ambulate in a controlled environment, # of feet with assistance (PT). Flowsheets (Taken 08/26/2021 1241) LTG: Pt will ambulate in controlled environ  assist needed:: (LRAD) Independent with assistive device LTG: Ambulation distance in controlled environment: 150' Goal: LTG Patient will ambulate in home environment (PT) Description: LTG: Patient will ambulate in home environment, # of feet with assistance (PT). Flowsheets (Taken 08/26/2021 1241) LTG: Pt will ambulate in home environ  assist  needed:: (LRAD) Independent with assistive device LTG: Ambulation distance in home environment: 50'   Problem: RH Stairs Goal: LTG Patient will ambulate up and down stairs w/assist (PT) Description: LTG: Patient will ambulate up and down # of stairs with assistance (PT) Flowsheets (Taken 08/26/2021 1241) LTG: Pt will ambulate up/down stairs assist needed:: Supervision/Verbal cueing LTG: Pt will  ambulate up and down number of stairs: 5

## 2021-08-26 NOTE — Evaluation (Signed)
Occupational Therapy Assessment and Plan  Patient Details  Name: Katherine [name] MRN: [id number] Date of Birth: 1956/06/30  OT Diagnosis: muscular wasting and disuse atrophy, muscle weakness (generalized), and general deconditioning and decreased endurance Rehab Potential:   ELOS: 7-10 days   Today's Date: 08/26/2021 OT Individual Time: 7124-5809 OT Individual Time Calculation (min): 71 min     Hospital Problem: Principal Problem:   Debility Active Problems:   Pneumonia, viral   Post covid-19 condition, unspecified   Past Medical History:  Past Medical History:  Diagnosis Date   Anemia    Anxiety    Bilateral carotid artery stenosis    Carotid duplex 04/8337: 2-50% LICA, 53-97% RICA, >67% RECA, f/u 1 yr suggested   CAD (coronary artery disease) of bypass graft 5/01; 3/'02, 8/'03, 10/'04; 1/15   PCI x 5 to SVG-D1    CAD in native artery 07/1993   3 Vessel Disease (LAD-D1 & RCA) -- CABG (Dx in setting of inferior STEMI-PTCA of RCA)   CAD S/P percutaneous coronary angioplasty    PCI to SVG-D1 insertion/native D1 x 4 = '01 -(S660 BMS 2.5 x 9 anastomosis- D1); '02 - distal overlap ACS Pixel 2.5 x 8  BMS; '03 distal/native ISR/Thrombosis - Pixel 2.5 x 13; '04 - ISR-  Taxus 2.5 x 20 (covered all);; 1/15 - mid SVG-D1 (50% distal ISR) - Promus P 2.75 x 20 -- 2.8 mm; 6/22: Extensive ISR PTCA & Anastomotic-Native Diag DES PCI (Frontier Onyx 2.25x12 (2.75-2.5 mm post-dilation   COPD mixed type ([place])    Followed by Dr. Lamonte Sakai "pulmonologist said no COPD"   Depression with anxiety    Diabetes mellitus type 2 in obese ([place])    Diarrhea    started after cholecystectomy and mass removed from intestine   Dyslipidemia, goal LDL below 70    08/2012: TC 137, TG 200, HDL 32!, LDL 45; on statin (followed by Dr.Deterding)   ESRD (end stage renal disease) ([place]) 1991   s/p Cadaveric Renal Transplant ([facility] - Dr. Jimmy Footman)    Family history of adverse reaction to anesthesia    mom's bp dropped  during/after anesthesia   Fibromyalgia    GERD (gastroesophageal reflux disease)    Glomerulonephritis, chronic, rapidly progressive 1989   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 07/1993   Rescue PTCA of RCA -- referred for CABG.   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  Had having having lots of nausea and vomiting with poor p.o. intake.  Thought to have QT prolongation with multiple medications involved and hypomagnesemia, hypokalemia.  Tikosyn discontinued along with Zoloft and Phenergan.   Hypertension associated with diabetes ([place])    Mild aortic stenosis by prior echocardiogram 07/2019   Echo:  Mild aortic stenosis (gradients: Mean 14.3 mmHg -peak 24.9 mmHg).   Morbid obesity ([facility])    MRSA (methicillin resistant staph aureus) culture positive    OSA (obstructive sleep apnea)    no longer on CPAP or home O2, states she doesn't need now after lap band   PAD (peripheral artery disease) ([place]) 08/2013   LEA Dopplers to be read by Dr. Fletcher Anon   PAF (  paroxysmal atrial fibrillation) ([place]) 06/2014   Noted on CardioNet Monitor  - --> rhythm control with Tikosyn (Dr. Rayann Heman); converted from warfarin to apixaban for anticoagulation.   Pneumonia    Recurrent boils    Bilateral Groin   Rheumatoid arthritis ([place])    Per Patient Report; associated with OA   S/p cadaver renal transplant 1991   [facility]   Torsades de pointes 05/12/2021   Unstable angina ([place]) 5/01; 3/'02, 8/'03, 10/'04; 1/15   x 5 occurences since Inf-Post STEMI in 1994   Past Surgical History:  Past Surgical History:  Procedure Laterality Date   ABDOMINAL AORTOGRAM N/A 04/21/2018   Procedure: ABDOMINAL AORTOGRAM;  Surgeon:  [name], MD;  Location: [place] [facility];  Service: Cardiovascular;  Laterality: N/A;   CATHETER REMOVAL     CHOLECYSTECTOMY N/A 10/29/2014   Procedure: LAPAROSCOPIC CHOLECYSTECTOMY WITH INTRAOPERATIVE CHOLANGIOGRAM;  Surgeon: Excell Seltzer, MD;  Location: [facility];  Service: General;  Laterality: N/A;   CORONARY ANGIOPLASTY  1994   x5   CORONARY ARTERY BYPASS GRAFT  1995   LIMA-LAD, SVG-RPDA, SVG-D1   CORONARY STENT INTERVENTION N/A 02/20/2021   Procedure: PERCUTANEOUS CORONARY STENT INTERVENTION;  Surgeon: [name], MD;  Location: [place] [facility]; ostLCx 60% (Neg RFR 0.96);; SVG- D2 recurrent 90% ISR & 95% native D2 after graft-> DES PCI of 95% anastomotic D2 lesion (Onyx Frontier 2.25 mm x 12 mm => 2.75 mm @ overlap, 2.5 distal.);PTCA of ISR in body of graft. ->  2.5 mm scoring balloon & post-dil w/ 2.75 mm [place] balloon   ESOPHAGOGASTRODUODENOSCOPY N/A 10/15/2016   Procedure: ESOPHAGOGASTRODUODENOSCOPY (EGD);  Surgeon: Wilford Corner, MD;  Location: [facility];  Service: Endoscopy;  Laterality: N/A;   I & D EXTREMITY Right 01/29/2018   Procedure: IRRIGATION AND DEBRIDEMENT THUMB;  Surgeon: Dayna Barker, MD;  Location: [place];  Service: Plastics;  Laterality: Right;   INCISE AND DRAIN ABCESS     INTRAVASCULAR PRESSURE WIRE/FFR STUDY N/A 02/20/2021   Procedure: INTRAVASCULAR PRESSURE WIRE/FFR STUDY;  Surgeon: [name], MD;  Location: [place] [facility];  Service: Cardiovascular;  Laterality: N/A;   KIDNEY TRANSPLANT  1991   KNEE ARTHROSCOPY WITH LATERAL MENISECTOMY Left 12/03/2017   Procedure: LEFT KNEE ARTHROSCOPY WITH LATERAL MENISECTOMY;  Surgeon: Earlie Server, MD;  Location: [place];  Service: Orthopedics;  Laterality: Left;   LAPAROSCOPIC GASTRIC BANDING  04/2004; 10/'09, 2/'10   Port Replacement x 2   LEFT HEART CATH AND CORONARY ANGIOGRAPHY N/A 02/20/2021   Procedure: LEFT HEART CATH AND CORONARY ANGIOGRAPHY;  Surgeon: [name], MD;  Location:  [place] [facility];  Service: Cardiovascular;  Laterality: N/A;   LEFT HEART CATH AND CORS/GRAFTS ANGIOGRAPHY N/A 04/21/2018   Procedure: LEFT HEART CATH AND CORS/GRAFTS ANGIOGRAPHY;  Surgeon: [name], MD;  Location: [place] [facility];  Ost-Prox LAD 50% - proxLAD (pre & post D1) 100% CTO. Cx - patent, small OM1 (stable ~ ostial OM1 90%, too small for PCI) & 2 small LPL; Ost-distal RCA 100% CTO.  LIMA-LAD (not injected); SVG-dRCA patent, SVG-D1 - insertion stent ~20% ISR - Severe R CFA disease w/ focal Sub TO   LEFT HEART CATH AND CORS/GRAFTS ANGIOGRAPHY  5/'01, 3/'02, 8/'03, 10/'04; 1/'15   08/22/2013: LAD & RCA 100%; LIMA-LAD & SVG-rPDA patent; Cx-- OM1 60%, OM2 ostial ~50%; SVG-D1 - 80% mid, 50% distal ISR --PCI   LEFT HEART CATH AND CORS/GRAFTS ANGIOGRAPHY N/A 01/31/2021   Procedure: LEFT HEART CATH  AND CORS/GRAFTS ANGIOGRAPHY;  Surgeon: Jolaine Artist, MD;  Location: [facility] INVASIVE CV LAB;;   LEFT HEART CATH AND CORS/GRAFTS ANGIOGRAPHY N/A 08/21/2021   Procedure: LEFT HEART CATH AND CORS/GRAFTS ANGIOGRAPHY;  Surgeon: Belva Crome, MD;  Location: [place] [facility];  Service: Cardiovascular;  Laterality: N/A;   LEFT HEART CATHETERIZATION WITH CORONARY/GRAFT ANGIOGRAM N/A 08/23/2013   Procedure: LEFT HEART CATHETERIZATION WITH Beatrix Fetters;  Surgeon: Wellington Hampshire, MD;  Location: [place] CATH LAB;  Service: Cardiovascular;  Laterality: N/A;   Lower Extremity Arterial Dopplers  08/2013   ABI: R 0.96, L 1.04   MULTIPLE TOOTH EXTRACTIONS  age 76   NM MYOVIEW LTD  03/2016   EF 62%. LOW RISK. C/W prior MI - no Ischemia. Apical hypokinesis.   PERCUTANEOUS CORONARY STENT INTERVENTION (PCI-S)  5/'01, 3/'02, 8/'03, 10/'04;   '01 - S660 BMS 2.5 x 9 - dSVG-D1 into D1; '02- post-stent stenosis - 2.5 x 8 Pixel BMS; '8\03: ISR/Thrombosis into native D1 - AngioJet, 2.5 x 13 Pixel; '04 - ISR 95% - covered stented area with Taxus DES 2.5 mm x 20 (2.88)   PERCUTANEOUS CORONARY STENT  INTERVENTION (PCI-S)  08/23/2013   Procedure: PERCUTANEOUS CORONARY STENT INTERVENTION (PCI-S);  Surgeon: Wellington Hampshire, MD;  Location: [facility] CATH LAB;  Service: Cardiovascular;;mid SVG-D1 80%; distal stent ~50% ISR; Promus [place] DES 2.75 mm xc 20 mm (2.8 mm)   PORT-A-CATH REMOVAL     kidney   TRANSTHORACIC ECHOCARDIOGRAM  07/2019   a) 07/2019: EF 55 to 60%.  No LVH.  Paradoxical septal WM-s/p CABG.  GRII DD.  Nl RV size and fxn.  Mild bilateral atrial dilation.  Mod MAC.  Trace MR.  Mild AS (gradients: Mean 14.3 mmHg -peak 24.9 mmHg).; B) 06/2020: EF 40 to 45%.  Moderate concentric LVH.  GRII DD.  Elevated LAP.  Mod HK mid Apical Ant-AntSept wall & mild Apical Dyskinesis.  Mod LA dilation.  Mild MR.  AoV sclerosis w/o AS.   TRANSTHORACIC ECHOCARDIOGRAM  01/30/2021   EF 55 to 60%.  Mild LVH.  GR 1 DD.  Elevated LAP.  Moderate LA dilation.  Mild MR with mild MS.  Mild aortic valve stenosis.:   TUBAL LIGATION     wrist fistula repair Left    dialysis for one year    Assessment & Plan Clinical Impression: Meghan L. Liwanag is a 66 year old female with history of CAD s/p CABG/PCI- last 07/22, PAD, PAF/torsades Hx- on eliquis, chronic diastolic CHF, OSA--no  CPAP, T2DM with neuropathy, renal transplant, RA/FM w/chronic pain, COPD-3 L oxygen dependent who was admitted to [facility] on 07/25/21 with mild confusion, SOB with hypoxia despite increase in oxygen needs post exposure to flu, low grade fever/chills, diarrhea, pleuritic chest pain and generalized weakness.   She was found to have acute on chronic respiratory failure due to Covid PNA and treated with steriods, Remdisvir and Ceftriaxone. Hospital course significant for weakness with ongoing hypoxia requiring high flow oxygen as well as poorly controlled BS. On 12/24, she developed significant CP despite NTG and morphine. EKG showed diffuse ST and T wave abnormalities,  CXR showed acute on chronic fluid CHF as well as elevation in troponin's due to NSTEMI.  She was transitioned to IV heparin, treated diuresis but continued to have rise in troponin with increase in oxygen needs.    Dr. Justine Null was consulted to assist with management of anxiety/depression and she was started on ativan bid in addition to increase in Zolfot  to 250 mg/daily without SE.    She was taken to cath lab on 01/05 but found to have stenosis obstructing procedure and procedure aborted. Medical treatment recommended per Dr. Ronney Asters.   Patient transferred to CIR on 08/25/2021 .    Patient currently requires mod - Min A with basic self-care skills secondary to muscle weakness, decreased cardiorespiratoy endurance, and decreased sitting balance, decreased standing balance, decreased postural control, and decreased balance strategies.  Prior to hospitalization, patient could complete BADL  with modified independent .  Patient will benefit from skilled intervention to decrease level of assist with basic self-care skills and increase independence with basic self-care skills prior to discharge home with care partner.  Anticipate patient will require intermittent supervision and follow up home health.  OT - End of Session Activity Tolerance: Tolerates 30+ min activity with multiple rests Endurance Deficit: Yes Endurance Deficit Description: Requires frequent rest breaks OT Assessment OT Barriers to Discharge: Decreased caregiver support;Home environment access/layout OT Patient demonstrates impairments in the following area(s): Balance;Endurance;Motor;Safety OT Basic ADL's Functional Problem(s): Bathing;Grooming;Dressing;Toileting OT Transfers Functional Problem(s): Toilet;Tub/Shower OT Additional Impairment(s): None OT Plan OT Intensity: Minimum of 1-2 x/day, 45 to 90 minutes OT Frequency: 5 out of 7 days OT Duration/Estimated Length of Stay: 7-10 days OT Treatment/Interventions: Balance/vestibular training;Discharge planning;Self Care/advanced ADL retraining;Therapeutic  Activities;UE/LE Coordination activities;Visual/perceptual remediation/compensation;Therapeutic Exercise;Skin care/wound managment;Patient/family education;Functional mobility training;Disease mangement/prevention;Cognitive remediation/compensation;Community reintegration;DME/adaptive equipment instruction;Neuromuscular re-education;Psychosocial support;Splinting/orthotics;UE/LE Strength taining/ROM OT Self Feeding Anticipated Outcome(s): no goal OT Basic Self-Care Anticipated Outcome(s): Mod I OT Toileting Anticipated Outcome(s): Mod I OT Bathroom Transfers Anticipated Outcome(s): Mod I OT Recommendation Patient destination: Home Follow Up Recommendations: Home health OT Equipment Recommended: To be determined   OT Evaluation Precautions/Restrictions  Precautions Precautions: Fall Precaution Comments: monitor sats/HR, post covid debility/deconditioned Restrictions Weight Bearing Restrictions: No Pain Pain Assessment Pain Scale: 0-10 Pain Score: 2  Home Living/Prior Functioning Home Living Family/patient expects to be discharged to:: Private residence Living Arrangements: Other relatives, Non-relatives/Friends Available Help at Discharge: Family, Available 24 hours/day Type of Home: House Home Access: Stairs to enter Technical brewer of Steps: 5 Entrance Stairs-Rails: Left Home Layout: One level Bathroom Shower/Tub: Government social research officer Accessibility: No Additional Comments: Has BSC that she keeps in her bathtub, furniture walks around her house, wears 3L supplemental O2 at home  Lives With: Family (Brother and Film/video editor) Prior Function Level of Independence: Independent with basic ADLs, Independent with homemaking with ambulation, Independent with gait, Requires assistive device for independence  Able to Take Stairs?: No Driving: Yes Vision Baseline Vision/History: 1 Wears glasses Ability to See in Adequate Light: 0 Adequate Patient  Visual Report: No change from baseline Perception  Perception: Within Functional Limits Praxis Praxis: Intact Cognition Overall Cognitive Status: Within Functional Limits for tasks assessed Arousal/Alertness: Awake/alert Orientation Level: Person;Place;Situation Person: Oriented Place: Oriented Situation: Oriented Year: 2023 Month: January Day of Week: Correct Memory: Appears intact Immediate Memory Recall: Sock;Blue;Bed Memory Recall Sock: Without Cue Memory Recall Blue: With Cue Memory Recall Bed: Without Cue Awareness: Appears intact Problem Solving: Appears intact Safety/Judgment: Appears intact Sensation Sensation Light Touch: Impaired by gross assessment Peripheral sensation comments: Reports numbness in RLE > LLE Light Touch Impaired Details: Impaired RLE Additional Comments: impaired in BLEs (reports numbness in B feet) Coordination Gross Motor Movements are Fluid and Coordinated: Yes Fine Motor Movements are Fluid and Coordinated: Yes Coordination and Movement Description: limited by global deconditioning Finger Nose Finger Test: [facility] Shin Test: NT Motor  Motor Motor: Within Functional Limits  Trunk/Postural Assessment  Cervical Assessment Cervical Assessment: Exceptions to [facility] Thoracic Assessment Thoracic Assessment: Exceptions to [facility] Lumbar Assessment Lumbar Assessment: Exceptions to [facility] Postural Control Postural Control: Within Functional Limits  Balance Balance Balance Assessed: Yes Static Sitting Balance Static Sitting - Balance Support: Feet supported;Bilateral upper extremity supported Static Sitting - Level of Assistance: 7: Independent Dynamic Sitting Balance Dynamic Sitting - Balance Support: Feet supported;During functional activity Dynamic Sitting - Level of Assistance: 7: Independent Dynamic Sitting - Balance Activities: Forward lean/weight shifting;Lateral lean/weight shifting;Reaching across midline Static Standing Balance Static  Standing - Balance Support: During functional activity;Bilateral upper extremity supported Static Standing - Level of Assistance: 5: Stand by assistance Dynamic Standing Balance Dynamic Standing - Balance Support: During functional activity;Bilateral upper extremity supported Dynamic Standing - Level of Assistance: 5: Stand by assistance Dynamic Standing - Balance Activities: Lateral lean/weight shifting;Forward lean/weight shifting Extremity/Trunk Assessment RUE Assessment RUE Assessment: Within Functional Limits [name] Assessment [name] Assessment: Within Functional Limits  Care Tool Care Tool Self Care Eating    Independent    Oral Care     Set up    Bathing      Mod A        Upper Body Dressing(including orthotics)      Supervision      Lower Body Dressing (excluding footwear)    Mod A      Putting on/Taking off footwear    Mod A         Care Tool Toileting Toileting activity    Min A     Care Tool Bed Mobility Roll left and right activity   Roll left and right assist level: Supervision/Verbal cueing    Sit to lying activity   Sit to lying assist level: Supervision/Verbal cueing    Lying to sitting on side of bed activity   Lying to sitting on side of bed assist level: the ability to move from lying on the back to sitting on the side of the bed with no back support.: Supervision/Verbal cueing     Care Tool Transfers Sit to stand transfer   Sit to stand assist level: Contact Guard/Touching assist    Chair/bed transfer   Chair/bed transfer assist level: Contact Guard/Touching assist     Toilet transfer    CGA     Care Tool Cognition  Expression of Ideas and Wants Expression of Ideas and Wants: 4. Without difficulty (complex and basic) - expresses complex messages without difficulty and with speech that is clear and easy to understand  Understanding Verbal and Non-Verbal Content Understanding Verbal and Non-Verbal Content: 4. Understands (complex and  basic) - clear comprehension without cues or repetitions   Memory/Recall Ability Memory/Recall Ability : Current season;Location of own room;Staff names and faces;That he or she is in a hospital/hospital unit   Refer to Care Plan for [place] 1 OT Short Term Goal 1 (Week 1): STGs = LTGs 2/2 LOS  Recommendations for other services: None    Skilled Therapeutic Intervention ADL ADL Eating: Set up Grooming: Setup Upper Body Bathing: Supervision/safety Lower Body Bathing: Maximal assistance Upper Body Dressing: Minimal assistance Lower Body Dressing: Moderate assistance Toileting: Moderate assistance Where Assessed-Toileting: Bedside Commode Toilet Transfer: Contact guard Toilet Transfer Method: Stand pivot Mobility  Bed Mobility Bed Mobility: Supine to Sit;Sit to Supine Rolling Right: Supervision/verbal cueing Rolling Left: Supervision/Verbal cueing Supine to Sit: Supervision/Verbal cueing Sit to Supine: Supervision/Verbal cueing Transfers Sit to Stand: Contact Guard/Touching assist Stand to Sit:  Contact Guard/Touching assist  Skilled Interventions: Pt greeted at time of eval at bed level resting on 3L of O2 and remained on throughout session. O2 sats checked and 100% prior to activity with HR of 83, checked after ADL with sats at 88% and HR at 89. Quickly recovered with PLB. Focus of session on functional transfers to recliner and BSC with CGA, toileting tasks with Min/Mod A for posterior hygiene. Sink level bathing with Mod A to reach BLEs past knee level and thoroughly wash buttocks. Pt wanting to wear gown only, donned with Min A. Pt needing assist to change brief to manage straps. Pt managing her own O2 tubing throughout ADL. Stand pivot back to bed CGA and sit > supine same manner. Alarm on call bell in reach and meeting with MD.    Discharge Criteria: Patient will be discharged from OT if patient refuses treatment 3 consecutive times without  medical reason, if treatment goals not met, if there is a change in medical status, if patient makes no progress towards goals or if patient is discharged from hospital.  The above assessment, treatment plan, treatment alternatives and goals were discussed and mutually agreed upon: by patient  Viona Gilmore 08/26/2021, 12:40 PM

## 2021-08-26 NOTE — Evaluation (Signed)
Physical Therapy Assessment and Plan  Patient Details  Name: Katherine [name] MRN: [id number] Date of Birth: 1956/03/10  PT Diagnosis: Abnormality of gait, Difficulty walking, Impaired sensation, Muscle weakness, and Pain in rectum Rehab Potential: Good ELOS: 7-10 days   Today's Date: 08/26/2021 PT Individual Time: 9563-8756 PT Individual Time Calculation (min): 71 min    Hospital Problem: Principal Problem:   Debility Active Problems:   Pneumonia, viral   Post covid-19 condition, unspecified   Past Medical History:  Past Medical History:  Diagnosis Date   Anemia    Anxiety    Bilateral carotid artery stenosis    Carotid duplex 11/3327: 5-18% LICA, 84-16% RICA, >60% RECA, f/u 1 yr suggested   CAD (coronary artery disease) of bypass graft 5/01; 3/'02, 8/'03, 10/'04; 1/15   PCI x 5 to SVG-D1    CAD in native artery 07/1993   3 Vessel Disease (LAD-D1 & RCA) -- CABG (Dx in setting of inferior STEMI-PTCA of RCA)   CAD S/P percutaneous coronary angioplasty    PCI to SVG-D1 insertion/native D1 x 4 = '01 -(S660 BMS 2.5 x 9 anastomosis- D1); '02 - distal overlap ACS Pixel 2.5 x 8  BMS; '03 distal/native ISR/Thrombosis - Pixel 2.5 x 13; '04 - ISR-  Taxus 2.5 x 20 (covered all);; 1/15 - mid SVG-D1 (50% distal ISR) - Promus P 2.75 x 20 -- 2.8 mm; 6/22: Extensive ISR PTCA & Anastomotic-Native Diag DES PCI (Frontier Onyx 2.25x12 (2.75-2.5 mm post-dilation   COPD mixed type ([place])    Followed by Dr. Lamonte Sakai "pulmonologist said no COPD"   Depression with anxiety    Diabetes mellitus type 2 in obese ([place])    Diarrhea    started after cholecystectomy and mass removed from intestine   Dyslipidemia, goal LDL below 70    08/2012: TC 137, TG 200, HDL 32!, LDL 45; on statin (followed by Dr.Deterding)   ESRD (end stage renal disease) ([facility]) 1991   s/p Cadaveric Renal Transplant ([facility] - Dr. Jimmy Footman)    Family history of adverse reaction to anesthesia    mom's bp dropped during/after anesthesia    Fibromyalgia    GERD (gastroesophageal reflux disease)    Glomerulonephritis, chronic, rapidly progressive 1989   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 07/1993   Rescue PTCA of RCA -- referred for CABG.   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  Had having having lots of nausea and vomiting with poor p.o. intake.  Thought to have QT prolongation with multiple medications involved and hypomagnesemia, hypokalemia.  Tikosyn discontinued along with Zoloft and Phenergan.   Hypertension associated with diabetes ([place])    Mild aortic stenosis by prior echocardiogram 07/2019   Echo:  Mild aortic stenosis (gradients: Mean 14.3 mmHg -peak 24.9 mmHg).   Morbid obesity ([facility])    MRSA (methicillin resistant staph aureus) culture positive    OSA (obstructive sleep apnea)    no longer on CPAP or home O2, states she doesn't need now after lap band   PAD (peripheral artery disease) ([place]) 08/2013   LEA Dopplers to be read by Dr. Fletcher Anon   PAF (paroxysmal atrial fibrillation) (  [place]) 06/2014   Noted on CardioNet Monitor  - --> rhythm control with Tikosyn (Dr. Rayann Heman); converted from warfarin to apixaban for anticoagulation.   Pneumonia    Recurrent boils    Bilateral Groin   Rheumatoid arthritis ([place])    Per Patient Report; associated with OA   S/p cadaver renal transplant 1991   [facility]   Torsades de pointes 05/12/2021   Unstable angina ([place]) 5/01; 3/'02, 8/'03, 10/'04; 1/15   x 5 occurences since Inf-Post STEMI in 1994   Past Surgical History:  Past Surgical History:  Procedure Laterality Date   ABDOMINAL AORTOGRAM N/A 04/21/2018   Procedure: ABDOMINAL AORTOGRAM;  Surgeon: [name], MD;  Location:  [place] [facility];  Service: Cardiovascular;  Laterality: N/A;   CATHETER REMOVAL     CHOLECYSTECTOMY N/A 10/29/2014   Procedure: LAPAROSCOPIC CHOLECYSTECTOMY WITH INTRAOPERATIVE CHOLANGIOGRAM;  Surgeon: Excell Seltzer, MD;  Location: [facility];  Service: General;  Laterality: N/A;   CORONARY ANGIOPLASTY  1994   x5   CORONARY ARTERY BYPASS GRAFT  1995   LIMA-LAD, SVG-RPDA, SVG-D1   CORONARY STENT INTERVENTION N/A 02/20/2021   Procedure: PERCUTANEOUS CORONARY STENT INTERVENTION;  Surgeon: [name], MD;  Location: [place] [facility]; ostLCx 60% (Neg RFR 0.96);; SVG- D2 recurrent 90% ISR & 95% native D2 after graft-> DES PCI of 95% anastomotic D2 lesion (Onyx Frontier 2.25 mm x 12 mm => 2.75 mm @ overlap, 2.5 distal.);PTCA of ISR in body of graft. ->  2.5 mm scoring balloon & post-dil w/ 2.75 mm [place] balloon   ESOPHAGOGASTRODUODENOSCOPY N/A 10/15/2016   Procedure: ESOPHAGOGASTRODUODENOSCOPY (EGD);  Surgeon: Wilford Corner, MD;  Location: [facility] ENDOS[facility];  Service: Endoscopy;  Laterality: N/A;   I & D EXTREMITY Right 01/29/2018   Procedure: IRRIGATION AND DEBRIDEMENT THUMB;  Surgeon: Dayna Barker, MD;  Location: [place];  Service: Plastics;  Laterality: Right;   INCISE AND DRAIN ABCESS     INTRAVASCULAR PRESSURE WIRE/FFR STUDY N/A 02/20/2021   Procedure: INTRAVASCULAR PRESSURE WIRE/FFR STUDY;  Surgeon: [name], MD;  Location: [place] [facility];  Service: Cardiovascular;  Laterality: N/A;   KIDNEY TRANSPLANT  1991   KNEE ARTHROSCOPY WITH LATERAL MENISECTOMY Left 12/03/2017   Procedure: LEFT KNEE ARTHROSCOPY WITH LATERAL MENISECTOMY;  Surgeon: Earlie Server, MD;  Location: [place];  Service: Orthopedics;  Laterality: Left;   LAPAROSCOPIC GASTRIC BANDING  04/2004; 10/'09, 2/'10   Port Replacement x 2   LEFT HEART CATH AND CORONARY ANGIOGRAPHY N/A 02/20/2021   Procedure: LEFT HEART CATH AND CORONARY ANGIOGRAPHY;  Surgeon: [name], MD;  Location: [place] [facility];  Service:  Cardiovascular;  Laterality: N/A;   LEFT HEART CATH AND CORS/GRAFTS ANGIOGRAPHY N/A 04/21/2018   Procedure: LEFT HEART CATH AND CORS/GRAFTS ANGIOGRAPHY;  Surgeon: [name], MD;  Location: [place] [facility];  Ost-Prox LAD 50% - proxLAD (pre & post D1) 100% CTO. Cx - patent, small OM1 (stable ~ ostial OM1 90%, too small for PCI) & 2 small LPL; Ost-distal RCA 100% CTO.  LIMA-LAD (not injected); SVG-dRCA patent, SVG-D1 - insertion stent ~20% ISR - Severe R CFA disease w/ focal Sub TO   LEFT HEART CATH AND CORS/GRAFTS ANGIOGRAPHY  5/'01, 3/'02, 8/'03, 10/'04; 1/'15   08/22/2013: LAD & RCA 100%; LIMA-LAD & SVG-rPDA patent; Cx-- OM1 60%, OM2 ostial ~50%; SVG-D1 - 80% mid, 50% distal ISR --PCI   LEFT HEART CATH AND CORS/GRAFTS ANGIOGRAPHY N/A 01/31/2021   Procedure: LEFT HEART CATH AND CORS/GRAFTS ANGIOGRAPHY;  Surgeon: Jolaine Artist, MD;  Location: [facility];;   LEFT HEART CATH AND CORS/GRAFTS ANGIOGRAPHY N/A 08/21/2021   Procedure: LEFT HEART CATH AND CORS/GRAFTS ANGIOGRAPHY;  Surgeon: Belva Crome, MD;  Location: [place] [facility];  Service: Cardiovascular;  Laterality: N/A;   LEFT HEART CATHETERIZATION WITH CORONARY/GRAFT ANGIOGRAM N/A 08/23/2013   Procedure: LEFT HEART CATHETERIZATION WITH Beatrix Fetters;  Surgeon: Wellington Hampshire, MD;  Location: [place] [facility];  Service: Cardiovascular;  Laterality: N/A;   Lower Extremity Arterial Dopplers  08/2013   ABI: R 0.96, L 1.04   MULTIPLE TOOTH EXTRACTIONS  age 7   NM MYOVIEW LTD  03/2016   EF 62%. LOW RISK. C/W prior MI - no Ischemia. Apical hypokinesis.   PERCUTANEOUS CORONARY STENT INTERVENTION (PCI-S)  5/'01, 3/'02, 8/'03, 10/'04;   '01 - S660 BMS 2.5 x 9 - dSVG-D1 into D1; '02- post-stent stenosis - 2.5 x 8 Pixel BMS; '8\03: ISR/Thrombosis into native D1 - AngioJet, 2.5 x 13 Pixel; '04 - ISR 95% - covered stented area with Taxus DES 2.5 mm x 20 (2.88)   PERCUTANEOUS CORONARY STENT INTERVENTION (PCI-S)  08/23/2013    Procedure: PERCUTANEOUS CORONARY STENT INTERVENTION (PCI-S);  Surgeon: Wellington Hampshire, MD;  Location: [facility] CATH LAB;  Service: Cardiovascular;;mid SVG-D1 80%; distal stent ~50% ISR; Promus [place] DES 2.75 mm xc 20 mm (2.8 mm)   PORT-A-CATH REMOVAL     kidney   TRANSTHORACIC ECHOCARDIOGRAM  07/2019   a) 07/2019: EF 55 to 60%.  No LVH.  Paradoxical septal WM-s/p CABG.  GRII DD.  Nl RV size and fxn.  Mild bilateral atrial dilation.  Mod MAC.  Trace MR.  Mild AS (gradients: Mean 14.3 mmHg -peak 24.9 mmHg).; B) 06/2020: EF 40 to 45%.  Moderate concentric LVH.  GRII DD.  Elevated LAP.  Mod HK mid Apical Ant-AntSept wall & mild Apical Dyskinesis.  Mod LA dilation.  Mild MR.  AoV sclerosis w/o AS.   TRANSTHORACIC ECHOCARDIOGRAM  01/30/2021   EF 55 to 60%.  Mild LVH.  GR 1 DD.  Elevated LAP.  Moderate LA dilation.  Mild MR with mild MS.  Mild aortic valve stenosis.:   TUBAL LIGATION     wrist fistula repair Left    dialysis for one year    Assessment & Plan Clinical Impression: Patient is a 66 y.o. year old female with  history of CAD s/p CABG/PCI- last 07/22, PAD, PAF/torsades Hx- on eliquis, chronic diastolic CHF, OSA--no  CPAP, T2DM with neuropathy, renal transplant, RA/FM w/chronic pain, COPD-3 L oxygen dependent who was admitted to [facility] 07[facility]21 with mild confusion, SOB with hypoxia despite increase in oxygen needs post exposure to flu, low grade fever/chills, diarrhea, pleuritic chest pain and generalized weakness.   She was found to have acute on chronic respiratory failure due to Covid PNA and treated with steriods, Remdisvir and Ceftriaxone. Hospital course significant for weakness with ongoing hypoxia requiring high flow oxygen as well as poorly controlled BS. On 12/24, she developed significant CP despite NTG and morphine. EKG showed diffuse ST and T wave abnormalities,  CXR showed acute on chronic fluid CHF as well as elevation in troponin's due to NSTEMI. She was transitioned to IV heparin,  treated diuresis but continued to have rise in troponin with increase in oxygen needs.    Dr. Justine Null was consulted to assist with management of anxiety/depression and she was started on ativan bid in addition to increase in Zolfot to 250 mg/daily without  SE.    She was taken to cath lab on 01/05 but found to have stenosis obstructing procedure and procedure aborted. Medical treatment recommended per Dr. Ronney Asters.    Patient currently requires supervision with mobility secondary to muscle weakness, decreased cardiorespiratoy endurance, and decreased standing balance.  Prior to hospitalization, patient was modified independent  with mobility with rollator and lived with Family (Brother and Film/video editor) in a House home.  Home access is 5Stairs to enter.  Patient will benefit from skilled PT intervention to maximize safe functional mobility, minimize fall risk, and decrease caregiver burden for planned discharge home with 24 hour supervision.  Anticipate patient will benefit from follow up [place] at discharge.  PT - End of Session Activity Tolerance: Tolerates < 10 min activity, no significant change in vital signs Endurance Deficit: Yes Endurance Deficit Description: Requires frequent rest breaks PT Assessment Rehab Potential (ACUTE/IP ONLY): Good PT Barriers to Discharge: Decreased caregiver support;Home environment access/layout;Weight PT Patient demonstrates impairments in the following area(s): Balance;Endurance;Motor;Safety;Sensory PT Transfers Functional Problem(s): Bed to Chair;Bed Mobility;Car;Furniture PT Locomotion Functional Problem(s): Ambulation;Wheelchair Mobility;Stairs PT Plan PT Intensity: Minimum of 1-2 x/day ,45 to 90 minutes PT Frequency: 5 out of 7 days PT Duration Estimated Length of Stay: 7-10 days PT Treatment/Interventions: Ambulation/gait training;Community reintegration;DME/adaptive equipment instruction;Neuromuscular re-education;Psychosocial support;Stair  training;UE/LE Strength taining/ROM;Wheelchair propulsion/positioning;UE/LE Coordination activities;Therapeutic Activities;Skin care/wound management;Pain management;Functional electrical stimulation;Discharge planning;Balance/vestibular training;Cognitive remediation/compensation;Disease management/prevention;Functional mobility training;Patient/family education;Splinting/orthotics;Therapeutic Exercise;Visual/perceptual remediation/compensation PT Transfers Anticipated Outcome(s): Mod I PT Locomotion Anticipated Outcome(s): Mod I PT Recommendation Recommendations for Other Services: Therapeutic Recreation consult Therapeutic Recreation Interventions: Pet therapy;Kitchen group;Stress management Follow Up Recommendations: Home health PT Patient destination: Home Equipment Recommended: To be determined   PT Evaluation Precautions/Restrictions Precautions Precautions: Fall Precaution Comments: monitor sats/HR, post covid debility/deconditioned Restrictions Weight Bearing Restrictions: No General   Vital Signs Pain Pain Assessment Pain Scale: 0-10 Pain Score: 2  Pain Interference Pain Interference Pain Effect on Sleep: 4. Almost constantly Pain Interference with Therapy Activities: 4. Almost constantly Pain Interference with Day-to-Day Activities: 4. Almost constantly Home Living/Prior Functioning Home Living Living Arrangements: Other relatives;Non-relatives/Friends Available Help at Discharge: Family;Available 24 hours/day Type of Home: House Home Access: Stairs to enter [employer] of Steps: 5 Entrance Stairs-Rails: Left Home Layout: One level Bathroom Shower/Tub: Chiropodist: Standard Bathroom Accessibility: No Additional Comments: Has BSC that she keeps in her bathtub, furniture walks around her house, wears 3L supplemental O2 at home  Lives With: Family (Brother and Film/video editor) Prior Function Level of Independence: Independent with basic  ADLs;Independent with homemaking with ambulation;Independent with gait;Requires assistive device for independence  Able to Take Stairs?: No Driving: Yes Vision/Perception  Vision - History Ability to See in Adequate Light: 0 Adequate Perception Perception: Within Functional Limits Praxis Praxis: Intact  Cognition Overall Cognitive Status: Within Functional Limits for tasks assessed Arousal/Alertness: Awake/alert Orientation Level: Oriented X4 Year: 2023 Month: January Day of Week: Correct Memory: Appears intact Immediate Memory Recall: Sock;Blue;Bed Memory Recall Sock: Without Cue Memory Recall Blue: With Cue Memory Recall Bed: Without Cue Awareness: Appears intact Problem Solving: Appears intact Safety/Judgment: Appears intact Sensation Sensation Light Touch: Impaired by gross assessment Peripheral sensation comments: Reports numbness in RLE > LLE Light Touch Impaired Details: Impaired RLE Additional Comments: impaired in BLEs (reports numbness in B feet) Coordination Gross Motor Movements are Fluid and Coordinated: Yes Fine Motor Movements are Fluid and Coordinated: Yes Coordination and Movement Description: limited by global deconditioning Finger Nose Finger Test: [facility] Heel Shin Test: NT Motor  Motor Motor:  Within Functional Limits   Trunk/Postural Assessment  Cervical Assessment Cervical Assessment: Exceptions to [facility] (forward head) Thoracic Assessment Thoracic Assessment: Exceptions to [facility] (Kyphotic) Lumbar Assessment Lumbar Assessment: Exceptions to [facility] (Posterior pelvic tilt) Postural Control Postural Control: Within Functional Limits  Balance Balance Balance Assessed: Yes Static Sitting Balance Static Sitting - Balance Support: Feet supported;Bilateral upper extremity supported Static Sitting - Level of Assistance: 7: Independent Dynamic Sitting Balance Dynamic Sitting - Balance Support: Feet supported;During functional activity Dynamic Sitting - Level  of Assistance: 7: Independent Dynamic Sitting - Balance Activities: Forward lean/weight shifting;Lateral lean/weight shifting;Reaching across midline Static Standing Balance Static Standing - Balance Support: During functional activity;Bilateral upper extremity supported Static Standing - Level of Assistance: 5: Stand by assistance Dynamic Standing Balance Dynamic Standing - Balance Support: During functional activity;Bilateral upper extremity supported Dynamic Standing - Level of Assistance: 5: Stand by assistance Dynamic Standing - Balance Activities: Lateral lean/weight shifting;Forward lean/weight shifting Extremity Assessment  RLE Assessment RLE Assessment: Exceptions to [facility] RLE Strength RLE Overall Strength: Deficits Right Hip Flexion: 3/5 Right Hip ABduction: 4-/5 Right Hip ADduction: 4-/5 Right Knee Flexion: 3+/5 Right Knee Extension: 4-/5 Right Ankle Dorsiflexion: 3+/5 Right Ankle Plantar Flexion: 4-/5 LLE Assessment LLE Assessment: Exceptions to WFL LLE Strength LLE Overall Strength: Deficits Left Hip Flexion: 3/5 Left Hip ABduction: 4-/5 Left Hip ADduction: 4-/5 Left Knee Flexion: 3+/5 Left Knee Extension: 4-/5 Left Ankle Dorsiflexion: 3+/5 Left Ankle Plantar Flexion: 4-/5  Care Tool Care Tool Bed Mobility Roll left and right activity   Roll left and right assist level: Supervision/Verbal cueing    Sit to lying activity   Sit to lying assist level: Supervision/Verbal cueing    Lying to sitting on side of bed activity   Lying to sitting on side of bed assist level: the ability to move from lying on the back to sitting on the side of the bed with no back support.: Supervision/Verbal cueing     Care Tool Transfers Sit to stand transfer   Sit to stand assist level: Contact Guard/Touching assist    Chair/bed transfer   Chair/bed transfer assist level: Contact Guard/Touching assist     Psychologist, counselling transfer activity did not occur:  Safety/medical concerns (Fatigue)        Care Tool Locomotion Ambulation   Assist level: Contact Guard/Touching assist Assistive device: Walker-rolling Max distance: 50'  Walk 10 feet activity   Assist level: Contact Guard/Touching assist Assistive device: Walker-rolling   Walk 50 feet with 2 turns activity   Assist level: Contact Guard/Touching assist Assistive device: Walker-rolling  Walk 150 feet activity Walk 150 feet activity did not occur: Safety/medical concerns (Fatigue)      Walk 10 feet on uneven surfaces activity Walk 10 feet on uneven surfaces activity did not occur: Safety/medical concerns (Fatigue)      Stairs Stair activity did not occur: Safety/medical concerns (Fatigue)        Walk up/down 1 step activity Walk up/down 1 step or curb (drop down) activity did not occur: Safety/medical concerns (Fatigue)      Walk up/down 4 steps activity Walk up/down 4 steps activity did not occur: Safety/medical concerns (Fatigue)      Walk up/down 12 steps activity Walk up/down 12 steps activity did not occur: Safety/medical concerns (Fatigue)      Pick up small objects from floor Pick up small object from the floor (from standing position) activity did not occur: Safety/medical concerns (Fatigue)  Wheelchair Is the patient using a wheelchair?: Yes Type of Wheelchair: Manual   Wheelchair assist level: Dependent - Patient 0%    Wheel 50 feet with 2 turns activity   Assist Level: Dependent - Patient 0%  Wheel 150 feet activity   Assist Level: Dependent - Patient 0%    Refer to Care Plan for Long Term Goals  SHORT TERM GOAL WEEK 1 PT Short Term Goal 1 (Week 1): STG = LTG due to LOS  Recommendations for other services: Therapeutic Recreation  Pet therapy, Kitchen group, and Stress management  Skilled Therapeutic Intervention Evaluation completed (see details above and below) with education on PT POC, CIR 3 hour requirement, CIR safety policies and goals. Pt  received supine in bed, on 3L O2, having incontinent brief changed by NT. Pt reported pain in rectum, did not provide quantitative value and was premedicated. Offered repositioning and distraction throughout session for pain modulation. Pt performed bed mobility w/S* and sit <>stand from low EOB w/CGA. Pt ambulated 10' to [facility] w/RW and CGA, noted decreased stance time on LLE, decreased step length of RLE and compensated Trendelenburg on R side. Transferred pt to 3L portable O2 tank. Pt reported significant history of falls, most commonly falling out of bed. Educated pt on purchasing bed rails to reduce risk of rolling out of bed while asleep. Pt transported to day room w/total A for time management and performed sit <>stands from [facility] w/CGA, min verbal cues for proper hand placement. Pt ambulated 41' and 35' w/RW and CGA, noted same gait deviations as described above. Pt's SpO2 maintained above 91% throughout session. Pt transported back to room w/total A 2/2 fatigue and was transferred back to 3L O2 on wall. Pt was left seated in WC in room, all needs in reach. Safety plan updated.   Mobility Bed Mobility Bed Mobility: Rolling Right;Rolling Left;Supine to Sit;Sit to Supine Rolling Right: Supervision/verbal cueing Rolling Left: Supervision/Verbal cueing Supine to Sit: Supervision/Verbal cueing Sit to Supine: Supervision/Verbal cueing Transfers Transfers: Sit to Stand;Stand Pivot Transfers;Stand to Sit Sit to Stand: Contact Guard/Touching assist Stand to Sit: Contact Guard/Touching assist Stand Pivot Transfers: Contact Guard/Touching assist Transfer (Assistive device): Rolling walker Locomotion  Gait Ambulation: Yes Gait Assistance: Contact Guard/Touching assist Gait Distance (Feet): 50 Feet Assistive device: Rolling walker Gait Gait: Yes Gait Pattern: Impaired Gait Pattern: Decreased stance time - right;Decreased step length - right;Lateral hip instability;Lateral trunk lean to left;Poor foot  clearance - left;Poor foot clearance - right Gait velocity: decr Stairs / Additional Locomotion Stairs: No Wheelchair Mobility Wheelchair Mobility: No   Discharge Criteria: Patient will be discharged from PT if patient refuses treatment 3 consecutive times without medical reason, if treatment goals not met, if there is a change in medical status, if patient makes no progress towards goals or if patient is discharged from hospital.  The above assessment, treatment plan, treatment alternatives and goals were discussed and mutually agreed upon: by patient  Cruzita Lederer Sutton Plake, PT, DPT 08/26/2021, 12:24 PM

## 2021-08-26 NOTE — Plan of Care (Signed)
°  Problem: RH Balance Goal: LTG Patient will maintain dynamic standing with ADLs (OT) Description: LTG:  Patient will maintain dynamic standing balance with assist during activities of daily living (OT)  Flowsheets (Taken 08/26/2021 1254) LTG: Pt will maintain dynamic standing balance during ADLs with: Independent with assistive device   Problem: Sit to Stand Goal: LTG:  Patient will perform sit to stand in prep for activites of daily living with assistance level (OT) Description: LTG:  Patient will perform sit to stand in prep for activites of daily living with assistance level (OT) Flowsheets (Taken 08/26/2021 1254) LTG: PT will perform sit to stand in prep for activites of daily living with assistance level: Independent with assistive device   Problem: RH Grooming Goal: LTG Patient will perform grooming w/assist,cues/equip (OT) Description: LTG: Patient will perform grooming with assist, with/without cues using equipment (OT) Flowsheets (Taken 08/26/2021 1254) LTG: Pt will perform grooming with assistance level of: Independent   Problem: RH Bathing Goal: LTG Patient will bathe all body parts with assist levels (OT) Description: LTG: Patient will bathe all body parts with assist levels (OT) Flowsheets (Taken 08/26/2021 1254) LTG: Pt will perform bathing with assistance level/cueing: Set up assist    Problem: RH Dressing Goal: LTG Patient will perform upper body dressing (OT) Description: LTG Patient will perform upper body dressing with assist, with/without cues (OT). Flowsheets (Taken 08/26/2021 1254) LTG: Pt will perform upper body dressing with assistance level of: Independent with assistive device Goal: LTG Patient will perform lower body dressing w/assist (OT) Description: LTG: Patient will perform lower body dressing with assist, with/without cues in positioning using equipment (OT) Flowsheets (Taken 08/26/2021 1254) LTG: Pt will perform lower body dressing with assistance level of:  Independent with assistive device   Problem: RH Toileting Goal: LTG Patient will perform toileting task (3/3 steps) with assistance level (OT) Description: LTG: Patient will perform toileting task (3/3 steps) with assistance level (OT)  Flowsheets (Taken 08/26/2021 1254) LTG: Pt will perform toileting task (3/3 steps) with assistance level: Independent with assistive device   Problem: RH Toilet Transfers Goal: LTG Patient will perform toilet transfers w/assist (OT) Description: LTG: Patient will perform toilet transfers with assist, with/without cues using equipment (OT) Flowsheets (Taken 08/26/2021 1254) LTG: Pt will perform toilet transfers with assistance level of: Independent with assistive device   Problem: RH Tub/Shower Transfers Goal: LTG Patient will perform tub/shower transfers w/assist (OT) Description: LTG: Patient will perform tub/shower transfers with assist, with/without cues using equipment (OT) Flowsheets (Taken 08/26/2021 1254) LTG: Pt will perform tub/shower stall transfers with assistance level of: Independent with assistive device

## 2021-08-26 NOTE — Patient Care Conference (Signed)
Inpatient RehabilitationTeam Conference and Plan of Care Update Date: 08/26/2021   Time: 12:02 PM    Patient Name: Katherine [name]      Medical Record Number: [id number]  Date of Birth: 01/30/1956 Sex: Female         Room/Bed: 4W09C/4W09C-01 Payor Info: Payor: MEDICARE / Plan: MEDICARE PART A AND B / Product Type: *No Product type* /    Admit Date/Time:  08/25/2021 12:28 PM  Primary Diagnosis:  [facility] Problems: Principal Problem:   Debility Active Problems:   Pneumonia, viral   Post covid-19 condition, unspecified    Expected Discharge Date: Expected Discharge Date: 09/01/21  Team Members Present: Physician leading conference: Dr. Alger Simons Social Worker Present: Ovidio Kin, LCSW Nurse Present: Dorien Chihuahua, RN PT Present: Francena Hanly, PT OT Present: Lillia Corporal, OT PPS Coordinator present : Gunnar Fusi, SLP     Current Status/Progress Goal Weekly Team Focus  Bowel/Bladder     Continent of bowel and bladder   Continent   Toileting  Swallow/Nutrition/ Hydration             ADL's   Mod A LB bathe/dress, sit <> stands CGA, stand pivot CGA with RW, toileting Min/Mod, poor endurance  Mod I  endurance, activity tolerance, standing balance/tolerance, ADL retraining, energy conservation   Mobility             Communication             Safety/Cognition/ Behavioral Observations            Pain     Pain managed with scheduled and prn medications   Fibromyalgia, chronic pain managed   Assess effectiveness and need for prn medications for pain  Skin     Rash to toes, MASD to buttocks and thighs/groin. Dry skin overall   Skin healing, not new integrity issues   Powder to buttocks, toileting/continence management, cream to toes/dry skin    Discharge Planning:  New evaluation-lives with brother and god-daughter will need to see if can assist at [facility] confirm   Team Discussion: Patient well known to MD for chronic pain issues. Very deconditioned  post COVID,  and PNA but motivated. Patient on target to meet rehab goals: yes, currently Min - mod assist for ADLs and needed assistance for hygiene after toileting. Goals for discharge set for CGA - Mod I assist  *See Care Plan and progress notes for long and short-term goals.   Revisions to Treatment Plan:  Working on compensatory strategies and adaptations for self care    Teaching Needs: Medications, secondary risk management, skin care, transfers, toileting, safety, etc  Current Barriers to Discharge: Decreased caregiver support and Home enviroment access/layout  Possible Resolutions to Barriers: Family education     Medical Summary Current Status: debility after pneumonia. complicated cardiac hx.  chronic pain.  Barriers to Discharge: Medical stability   Possible Resolutions to Barriers/Weekly Focus: local skin care. pain control, daily assessment of labs and pt data   Continued Need for Acute Rehabilitation Level of Care: The patient requires daily medical management by a physician with specialized training in physical medicine and rehabilitation for the following reasons: Direction of a multidisciplinary physical rehabilitation program to maximize functional independence : Yes Medical management of patient stability for increased activity during participation in an intensive rehabilitation regime.: Yes Analysis of laboratory values and/or radiology reports with any subsequent need for medication adjustment and/or medical intervention. : Yes   I attest that I was present, lead  the team conference, and concur with the assessment and plan of the team.   Margarito Liner 08/26/2021, 12:16 PM

## 2021-08-26 NOTE — Progress Notes (Signed)
Spoke with nurse Loma Sousa regarding [place] for STAT glucose. CBG @ 1703 was 402 , repeat CBG @1954  was 309. Nurse called MD with new orders.

## 2021-08-26 NOTE — Progress Notes (Signed)
This nurse entered room to give PM meds and patient was in tears crying, stating "She hasn't felt this mistreated since she had been to in the hospital" because nurse tech stated that this nurse couldn't provide her with extra orange sherbert as requested. This nurse explained that her blood sugar was over 400. Patient stated that she "barely ate anything at dinner" and that "she didn't eat the vegetables because they are too hard on her stomach" Pt also stated she doesn't understand why she can't have ANOTHER sherbert indicating she had one with dinner. Spoke with Tech confirmed she ate most of her meal. This nurse offered her crackers and she accepted as well as a diet gingerale. Pt said that its not fair that "i'm starving her". This nurse educated patient on DM and how certain foods can increase insulin levels as well.

## 2021-08-26 NOTE — Progress Notes (Signed)
Spoke with on call MD about pt CBG. On call MD stated that she wanted this nurse to provide sliding scale Novolog that was due at 1800 and hold next CBG check until 2300 and go by sliding scale at that time. Pt has requested Ensure and orange sherbert, due to CBG levels this nurse is unable to provide these sugary snacks and offered crackers instead. Will continue to monitor CBG levels. Pt denies any needs, call light within each.

## 2021-08-26 NOTE — Progress Notes (Signed)
Inpatient Rehabilitation Care Coordinator Assessment and Plan Patient Details  Name: Katherine [name] MRN: [id number] Date of Birth: [birth year]  Today's Date: 08/26/2021  Hospital Problems: Principal Problem:   Debility Active Problems:   Pneumonia, viral   Post covid-19 condition, unspecified  Past Medical History:  Past Medical History:  Diagnosis Date   Anemia    Anxiety    Bilateral carotid artery stenosis    Carotid duplex 10/5699: 7-79% LICA, 39-03% RICA, >00% RECA, f/u 1 yr suggested   CAD (coronary artery disease) of bypass graft 5/01; 3/'02, 8/'03, 10/'04; 1/15   PCI x 5 to SVG-D1    CAD in native artery 07/1993   3 Vessel Disease (LAD-D1 & RCA) -- CABG (Dx in setting of inferior STEMI-PTCA of RCA)   CAD S/P percutaneous coronary angioplasty    PCI to SVG-D1 insertion/native D1 x 4 = '01 -(S660 BMS 2.5 x 9 anastomosis- D1); '02 - distal overlap ACS Pixel 2.5 x 8  BMS; '03 distal/native ISR/Thrombosis - Pixel 2.5 x 13; '04 - ISR-  Taxus 2.5 x 20 (covered all);; 1/15 - mid SVG-D1 (50% distal ISR) - Promus P 2.75 x 20 -- 2.8 mm; 6/22: Extensive ISR PTCA & Anastomotic-Native Diag DES PCI (Frontier Onyx 2.25x12 (2.75-2.5 mm post-dilation   COPD mixed type (HCC)    Followed by Dr. Lamonte Sakai "pulmonologist said no COPD"   Depression with anxiety    Diabetes mellitus type 2 in obese ([place])    Diarrhea    started after cholecystectomy and mass removed from intestine   Dyslipidemia, goal LDL below 70    08/2012: TC 137, TG 200, HDL 32!, LDL 45; on statin (followed by Dr.Deterding)   ESRD (end stage renal disease) ([place]) 1991   s/p Cadaveric Renal Transplant [facility] - Dr. Jimmy Footman)    Family history of adverse reaction to anesthesia    mom's bp dropped during/after anesthesia   Fibromyalgia    GERD (gastroesophageal reflux disease)    Glomerulonephritis, chronic, rapidly progressive 1989   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 07/1993   Rescue PTCA of  RCA -- referred for CABG.   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  Had having having lots of nausea and vomiting with poor p.o. intake.  Thought to have QT prolongation with multiple medications involved and hypomagnesemia, hypokalemia.  Tikosyn discontinued along with Zoloft and Phenergan.   Hypertension associated with diabetes ([place])    Mild aortic stenosis by prior echocardiogram 07/2019   Echo:  Mild aortic stenosis (gradients: Mean 14.3 mmHg -peak 24.9 mmHg).   Morbid obesity (HCC)    MRSA (methicillin resistant staph aureus) culture positive    OSA (obstructive sleep apnea)    no longer on CPAP or home O2, states she doesn't need now after lap band   PAD (peripheral artery disease) ([place]) 08/2013   LEA Dopplers to be read by Dr. Fletcher Anon   PAF (paroxysmal atrial fibrillation) ([place]) 06/2014   Noted on CardioNet Monitor  - --> rhythm control with Tikosyn (Dr. Rayann Heman); converted from warfarin to apixaban for anticoagulation.   Pneumonia    Recurrent boils    Bilateral  Groin   Rheumatoid arthritis [facility])    Per Patient Report; associated with OA   S/p cadaver renal transplant 1991   [facility]   Torsades de pointes 05/12/2021   Unstable angina ([place]) 5/01; 3/'02, 8/'03, 10/'04; 1/15   x 5 occurences since Inf-Post STEMI in 1994   Past Surgical History:  Past Surgical History:  Procedure Laterality Date   ABDOMINAL AORTOGRAM N/A 04/21/2018   Procedure: ABDOMINAL AORTOGRAM;  Surgeon: [name], MD;  Location: [place] CV LAB;  Service: Cardiovascular;  Laterality: N/A;   CATHETER REMOVAL     CHOLECYSTECTOMY N/A 10/29/2014   Procedure: LAPAROSCOPIC CHOLECYSTECTOMY WITH INTRAOPERATIVE CHOLANGIOGRAM;  Surgeon:  Excell Seltzer, MD;  Location: [facility];  Service: General;  Laterality: N/A;   CORONARY ANGIOPLASTY  1994   x5   CORONARY ARTERY BYPASS GRAFT  1995   LIMA-LAD, SVG-RPDA, SVG-D1   CORONARY STENT INTERVENTION N/A 02/20/2021   Procedure: PERCUTANEOUS CORONARY STENT INTERVENTION;  Surgeon: [name], MD;  Location: [place] CV LAB; ostLCx 60% (Neg RFR 0.96);; SVG- D2 recurrent 90% ISR & 95% native D2 after graft-> DES PCI of 95% anastomotic D2 lesion (Onyx Frontier 2.25 mm x 12 mm => 2.75 mm @ overlap, 2.5 distal.);PTCA of ISR in body of graft. ->  2.5 mm scoring balloon & post-dil w/ 2.75 mm Kotlik balloon   ESOPHAGOGASTRODUODENOSCOPY N/A 10/15/2016   Procedure: ESOPHAGOGASTRODUODENOSCOPY (EGD);  Surgeon: Wilford Corner, MD;  Location: [facility] ENDOSCOPY;  Service: Endoscopy;  Laterality: N/A;   I & D EXTREMITY Right 01/29/2018   Procedure: IRRIGATION AND DEBRIDEMENT THUMB;  Surgeon: Dayna Barker, MD;  Location: [place];  Service: Plastics;  Laterality: Right;   INCISE AND DRAIN ABCESS     INTRAVASCULAR PRESSURE WIRE/FFR STUDY N/A 02/20/2021   Procedure: INTRAVASCULAR PRESSURE WIRE/FFR STUDY;  Surgeon: [name], MD;  Location: [place] CV LAB;  Service: Cardiovascular;  Laterality: N/A;   KIDNEY TRANSPLANT  1991   KNEE ARTHROSCOPY WITH LATERAL MENISECTOMY Left 12/03/2017   Procedure: LEFT KNEE ARTHROSCOPY WITH LATERAL MENISECTOMY;  Surgeon: Earlie Server, MD;  Location: [place];  Service: Orthopedics;  Laterality: Left;   LAPAROSCOPIC GASTRIC BANDING  04/2004; 10/'09, 2/'10   Port Replacement x 2   LEFT HEART CATH AND CORONARY ANGIOGRAPHY N/A 02/20/2021   Procedure: LEFT HEART CATH AND CORONARY ANGIOGRAPHY;  Surgeon: [name], MD;  Location: [place] CV LAB;  Service: Cardiovascular;  Laterality: N/A;   LEFT HEART CATH AND CORS/GRAFTS ANGIOGRAPHY N/A 04/21/2018   Procedure: LEFT HEART CATH AND CORS/GRAFTS ANGIOGRAPHY;  Surgeon: [name], MD;  Location: [place]  CV LAB;  Ost-Prox LAD 50% - proxLAD (pre & post D1) 100% CTO. Cx - patent, small OM1 (stable ~ ostial OM1 90%, too small for PCI) & 2 small LPL; Ost-distal RCA 100% CTO.  LIMA-LAD (not injected); SVG-dRCA patent, SVG-D1 - insertion stent ~20% ISR - Severe R CFA disease w/ focal Sub TO   LEFT HEART CATH AND CORS/GRAFTS ANGIOGRAPHY  5/'01, 3/'02, 8/'03, 10/'04; 1/'15   08/22/2013: LAD & RCA 100%; LIMA-LAD & SVG-rPDA patent; Cx-- OM1 60%, OM2 ostial ~50%; SVG-D1 - 80% mid, 50% distal ISR --PCI   LEFT HEART CATH AND CORS/GRAFTS ANGIOGRAPHY N/A 01/31/2021   Procedure: LEFT HEART CATH AND CORS/GRAFTS ANGIOGRAPHY;  Surgeon: Jolaine Artist, MD;  Location: [place] CV LAB;;   LEFT HEART CATH AND CORS/GRAFTS ANGIOGRAPHY N/A 08/21/2021   Procedure: LEFT HEART CATH AND CORS/GRAFTS ANGIOGRAPHY;  Surgeon: Belva Crome,  MD;  Location: [place] CV LAB;  Service: Cardiovascular;  Laterality: N/A;   LEFT HEART CATHETERIZATION WITH CORONARY/GRAFT ANGIOGRAM N/A 08/23/2013   Procedure: LEFT HEART CATHETERIZATION WITH Beatrix Fetters;  Surgeon: Wellington Hampshire, MD;  Location: [place] CATH LAB;  Service: Cardiovascular;  Laterality: N/A;   Lower Extremity Arterial Dopplers  08/2013   ABI: R 0.96, L 1.04   MULTIPLE TOOTH EXTRACTIONS  age 35   NM MYOVIEW LTD  03/2016   EF 62%. LOW RISK. C/W prior MI - no Ischemia. Apical hypokinesis.   PERCUTANEOUS CORONARY STENT INTERVENTION (PCI-S)  5/'01, 3/'02, 8/'03, 10/'04;   '01 - S660 BMS 2.5 x 9 - dSVG-D1 into D1; '02- post-stent stenosis - 2.5 x 8 Pixel BMS; '8\03: ISR/Thrombosis into native D1 - AngioJet, 2.5 x 13 Pixel; '04 - ISR 95% - covered stented area with Taxus DES 2.5 mm x 20 (2.88)   PERCUTANEOUS CORONARY STENT INTERVENTION (PCI-S)  08/23/2013   Procedure: PERCUTANEOUS CORONARY STENT INTERVENTION (PCI-S);  Surgeon: Wellington Hampshire, MD;  Location: [facility] CATH LAB;  Service: Cardiovascular;;mid SVG-D1 80%; distal stent ~50% ISR; Promus [place] DES 2.75 mm xc 20  mm (2.8 mm)   PORT-A-CATH REMOVAL     kidney   TRANSTHORACIC ECHOCARDIOGRAM  07/2019   a) 07/2019: EF 55 to 60%.  No LVH.  Paradoxical septal WM-s/p CABG.  GRII DD.  Nl RV size and fxn.  Mild bilateral atrial dilation.  Mod MAC.  Trace MR.  Mild AS (gradients: Mean 14.3 mmHg -peak 24.9 mmHg).; B) 06/2020: EF 40 to 45%.  Moderate concentric LVH.  GRII DD.  Elevated LAP.  Mod HK mid Apical Ant-AntSept wall & mild Apical Dyskinesis.  Mod LA dilation.  Mild MR.  AoV sclerosis w/o AS.   TRANSTHORACIC ECHOCARDIOGRAM  01/30/2021   EF 55 to 60%.  Mild LVH.  GR 1 DD.  Elevated LAP.  Moderate LA dilation.  Mild MR with mild MS.  Mild aortic valve stenosis.:   TUBAL LIGATION     wrist fistula repair Left    dialysis for one year   Social History:  reports that she has been smoking cigarettes. She has a 30.00 pack-year smoking history. She has never used smokeless tobacco. She reports that she does not drink alcohol and does not use drugs.  Family / Support Systems Marital Status: Widow/Widower Patient Roles: Other (Comment) (sibling and god-parent) Other Supports: Sherri Austin-niece [phone]  Erica-god-daughter lives with pt and pt's brother Anticipated Caregiver: Erica-god daughter  Venida Jarvis can check in on Ability/Limitations of Caregiver: Danae Chen lives with pt and brother and has been assisting pt prior to admission. Pt's brother is disabled and can only provide supervision level Caregiver Availability: 24/7 Family Dynamics: Close with her four surviving siblings and extended family. Pt could not have children but helped raise her nieces and nephews and her god-daughter. Pt feels she has good supports  Social History Preferred language: English Religion: Baptist Cultural Background: No issues Education: [place] - How often do you need to have someone help you when you read instructions, pamphlets, or other written material from your doctor or pharmacy?: Sometimes Writes: Yes Employment  Status: Disabled Public relations account executive Issues: No issues Guardian/Conservator: None-according to MD pt is capable of making her own decisions while here   Abuse/Neglect Abuse/Neglect Assessment Can Be Completed: Yes Physical Abuse: Denies Verbal Abuse: Denies Sexual Abuse: Denies Exploitation of patient/patient's resources: Denies Self-Neglect: Denies  Patient response to: Social Isolation - How often do you  feel lonely or isolated from those around you?: Never  Emotional Status Pt's affect, behavior and adjustment status: Pt is motivated to get stronger and get to the level she was prior to admission until [place] hit her. She reports Danae Chen does assist her and was prior to admission. Pt is glad to have her there with her along with her brother. Recent Psychosocial Issues: other health issues was managing prior to [place] History: History of depression/anxiety takes multiple medications for this and finds them helpful. She is coping appropriately and able to verbalize her feelings and concerns. Substance Abuse History: Tobacco was still smoking aware of the risk with her being on O2 now. She is willing to quit and aware of the resources available for her  Patient / Family Perceptions, Expectations & Goals Pt/Family understanding of illness & functional limitations: Pt is able to explain her health issues and COVID. She is glad to be doing better and now here on rehab to get therapies and get back home to her dog, whom she misses. She does speak with the MD rounding and feels she has a good understanding of her treatment plan going forward. Premorbid pt/family roles/activities: Sibling, Aunt, god-mother, retiree, friend, etc Anticipated changes in roles/activities/participation: plans to resume at DC Pt/family expectations/goals: Pt states: " I want to get back to what I was able to do before this. I will make it it will take time."  [employer]:  None Premorbid Home Care/DME Agencies: Other (Comment) (has rollator, cane, tub bench and wc. Has had [facility] in the past) Transportation available at discharge: Family members Is the patient able to respond to transportation needs?: Yes In the past 12 months, has lack of transportation kept you from medical appointments or from getting medications?: Yes In the past 12 months, has lack of transportation kept you from meetings, work, or from getting things needed for daily living?: Yes Resource referrals recommended: Counseling  Discharge Planning Living Arrangements: Other relatives, Non-relatives/Friends Support Systems: Other relatives, Friends/neighbors Type of Residence: Private residence Insurance Resources: [employer], [employer] (specify county) Pensions consultant: SSD, Family Support Financial Screen Referred: No Living Expenses: Lives with family Money Management: Family, Patient Does the patient have any problems obtaining your medications?: No Home Management: Danae Chen does the home management Patient/Family Preliminary Plans: Return home with brother and Erica-god daughter who was assisting her prior to admission. Pt's brother is disabled but can be there and provide supervision if Danae Chen is out running errands. Made aware of team conference toda and may not be able to give target discharge date due to being evaluated today. Care Coordinator Anticipated Follow Up Needs: HH/OP  Clinical Impression Pleasant female who is motivated to work hard and realizes regaining her strength will take time to get back. Her family along with her god-daughter are very involved and willing to assist her at discharge. Pt misses her dog-Toby. Will await team evaluations and work on discharge needs.  Elease Hashimoto 08/26/2021, 10:14 AM

## 2021-08-26 NOTE — Progress Notes (Signed)
Patient ID: Katherine [name], female   DOB: 1955/10/09, 66 y.o.   MRN: [id number] Met with the patient to introduce self and role, rehab process, team conference and plan of care. Discussed medications, secondary risk management including HLD, HTN, A-fib, ESRD/post transplant, DM (A1c 7.9), COPD and dietary modification recommendations. Reviewed addition of home meds for DM management along with meal coverage. Patient noted new symptom of itching on toes bilaterally with red rash noted on toes/top of foot with dry skin overall. Patient has a concentrator for oxygen at home but would like a portable source other than a tank. Tanks are still a little bulky and she needs to calculate length of time away in order to have enough tanks while out of the home. Continue to follow along to discharge to address educational needs and facilitate preparation for discharge. Margarito Liner

## 2021-08-26 NOTE — Progress Notes (Signed)
Patient ID: Katherine [name], female   DOB: [birth year], 66 y.o.   MRN: [id number] Updated pt on team conference today-goals of supervision-mod/I level and target discharge of 1/16. She does feel she is doing well and is aware Hannah-OT to problem solve with her the  bathroom regarding equipment needs and adaptations. Continue to work on discharge needs.

## 2021-08-26 NOTE — Progress Notes (Signed)
Physical Therapy Session Note  Patient Details  Name: Katherine [name] MRN: [id number] Date of Birth: 14-Nov-1955  Today's Date: 08/26/2021 PT Missed Time: 73 Minutes Missed Time Reason: Patient fatigue  Short Term Goals: Week 1:  PT Short Term Goal 1 (Week 1): STG = LTG due to LOS  Skilled Therapeutic Interventions/Progress Updates:  Pt received supine in bed, reported 8/10 pain in rectum and had just had large BM. Pt requesting pain medication and suppository for pain relief and politely declined therapy due to "being wiped out" and in pain. Missed 45 minutes of skilled PT due to pt fatigue. Nursing notified to administer pain meds.    Therapy Documentation Precautions:  Precautions Precautions: Fall Precaution Comments: monitor sats/HR, post covid debility/deconditioned Restrictions Weight Bearing Restrictions: No   Therapy/Group: Individual Therapy Cruzita Lederer Ollin Hochmuth, PT, DPT  08/26/2021, 12:46 PM

## 2021-08-27 DIAGNOSIS — R5381 Other malaise: Secondary | ICD-10-CM | POA: Diagnosis not present

## 2021-08-27 LAB — GLUCOSE, CAPILLARY
Glucose-Capillary: 104 mg/dL — ABNORMAL HIGH (ref 70–99)
Glucose-Capillary: 291 mg/dL — ABNORMAL HIGH (ref 70–99)
Glucose-Capillary: 286 mg/dL — ABNORMAL HIGH (ref 70–99)
Glucose-Capillary: 134 mg/dL — ABNORMAL HIGH (ref 70–99)

## 2021-08-27 MED ORDER — INSULIN GLARGINE-YFGN 100 UNIT/ML ~~LOC~~ SOLN
17.0000 [IU] | Freq: Every day | SUBCUTANEOUS | Status: DC
  Administered 2021-08-28 – 2021-08-30 (×3): 17 [IU] via SUBCUTANEOUS
  Filled 2021-08-27 (×5): qty 0.17

## 2021-08-27 MED ORDER — ALTEPLASE 2 MG IJ SOLR
2.0000 mg | Freq: Once | INTRAMUSCULAR | Status: DC
  Filled 2021-08-27: qty 2

## 2021-08-27 MED ORDER — PREDNISONE 5 MG PO TABS
15.0000 mg | ORAL_TABLET | Freq: Every day | ORAL | Status: DC
  Administered 2021-08-28 – 2021-08-29 (×2): 15 mg via ORAL
  Filled 2021-08-27 (×2): qty 3

## 2021-08-27 MED ORDER — METOPROLOL SUCCINATE ER 25 MG PO TB24
12.5000 mg | ORAL_TABLET | Freq: Every day | ORAL | Status: DC
  Administered 2021-08-27 – 2021-09-01 (×6): 12.5 mg via ORAL
  Filled 2021-08-27 (×7): qty 1

## 2021-08-27 MED ORDER — METOPROLOL TARTRATE 12.5 MG HALF TABLET: 12.5000 mg | ORAL_TABLET | Freq: Every day | ORAL | Status: DC

## 2021-08-27 NOTE — Progress Notes (Signed)
Occupational Therapy Session Note  Patient Details  Name: Katherine [name] MRN: [id number] Date of Birth: Oct 16, 1955  Today's Date: 08/27/2021 OT Individual Time: 1660-6004 OT Individual Time Calculation (min): 45 min    Short Term Goals: Week 1:  OT Short Term Goal 1 (Week 1): STGs = LTGs 2/2 LOS  Skilled Therapeutic Interventions/Progress Updates:    Pt received in bed and stated she did get a fresh dress on earlier so did not need to bathe or dress now. She discussed her rough start to the morning due to feeling very upset last night. Discussed the difficulties of being in the hospital and how emotions can be elevated with extra stressors.  Pt fairly relaxed during this session.  She participated well and was actively involved in the session.  She discussed her small bathroom at her brothers house and what equipment options she has.  She will have her brother measure height of toilet and take some photos.  Pt did well sitting to EOB without A. Stood with RW with S. Then able to ambulate with CGA to bathroom, in bathroom able to sit to stand and transfer to toilet with S. Completed toileting with S.  She was able to void bowel and did well with self cleansing.  Ambulated back to EOB.  Washed hands with cloth at bed.  MD arrived for visit.   Her main challenge is her endurance.  Pt resting EOB with bed alarm set and all needs met.  Therapy Documentation Precautions:  Precautions Precautions: Fall Precaution Comments: monitor sats/HR, post covid debility/deconditioned Restrictions Weight Bearing Restrictions: No  Vital Signs: Oxygen Therapy SpO2: 96 % O2 Device: Nasal Cannula O2 Flow Rate (L/min): 3 L/min Pain: Pain Assessment Pain Scale: 0-10 Pain Score: 0-No pain    Therapy/Group: Individual Therapy  [place] 08/27/2021, 11:25 AM

## 2021-08-27 NOTE — Progress Notes (Addendum)
Alerted by nursing staff that the patient had an episode of left anterior chest pain while out of bed working with therapy.  She indicated an urge to move her bowels and quickly got on the toilet.  She was able to have a bowel movement and urinate.  She reported left anterior chest pain approximately 7-8 out of 10.  She also felt breathless.  Pulse was approximately 120 bpm.  On my examination, the patient is resting comfortably supine in her bed.  Oxygen saturations are 99% on 3 L nasal cannula.  She states her pain has resolved.  She described the pain is similar to her heart attack in December.  She denied nausea, vomiting, diaphoresis or near syncope.  Heart rate regular, rhythm irregular.  Nonlabored breathing.  Few scattered wheezes. Medical notes were reviewed.  Dr. Stanford Breed note dated 08/22/2021.  Her primary cardiologist is Dr. Ellyn Hack.  History of coronary artery disease and numerous prior PCI's.  On Eliquis for paroxysmal atrial fibrillation/flutter.  Due to low blood pressure readings her beta-blocker and Imdur were discontinued earlier in her admission.  Unable to complete heart cath due to calcified femoral arteries. Placed call to Dr. Ali Lowe, MD.  Vital signs: 132/84, pulse 102, RR 18, temp 98.2. Recommended restarting Toprol XL 12.5 mg at bedtime.

## 2021-08-27 NOTE — Progress Notes (Signed)
Physical Therapy Session Note  Patient Details  Name: Katherine [name] MRN: [id number] Date of Birth: 1956-04-03  Today's Date: 08/27/2021 PT Individual Time: 1310-1340 PT Individual Time Calculation (min): 30 min   Short Term Goals: Week 1:  PT Short Term Goal 1 (Week 1): STG = LTG due to LOS  Skilled Therapeutic Interventions/Progress Updates:     Patient sitting up in bed finishing lunch upon PT arrival. On 3L/min O2. Patient alert and agreeable to PT session. Patient denied pain during session. Patient requested a orange sherbet or chocolate ensure and a diet soda. PT discussed with nursing who declined snacks at this time due to elevated CBG levels. Informed patient and provided education on management of blood sugar as related to her comorbid conditions. Patient appropriately requested a sugar free snack, deferred to nursing for time management. Diet soda was provided and patient very appreciative.   Patient reporting increased fatigue from morning therapy sessions, stated, "I just want to take a nap." Offered toileting, standing balance tasks, gait training, and seated exercises. Patient selected seated exercises due to fatigue levels at this time.   Therapeutic Activity: Bed Mobility: Patient performed supine to sit with mod I with use of hospital bed features. Provided education on progressing to performing bed mobility from a flat bed without rails to simulate her home environment, patient in agreement. Patient states she sleeps on many pillows to prop herself up in the bed.  Therapeutic Exercise: Patient performed the following exercises with verbal and tactile cues for proper technique. -seated marching 2x10 -seated adductor squeezed 2x10 with 5 sec hold -LAQ R/L 2x10  -seated clam shells 2x10  Transfers: Patient performed sit to/from stand x1 with close supervision using RW. Provided verbal cues for pacing movement due to impulsivity with bladder urgency. She performed an  ambulatory transfer >15 ft to the bathroom with CGA and cues for pacing, as patient was moving quickly due to urgency. Patient reported chest pain and stated, "I need my metoprolol." HR 122, SPO2 98%, RN made aware. Educated patient on importance of pacing herself and suggested she wear an incontinence brief or a pad to reduce fall risk and overexertion with bladder urgency. Patient stated she would think about it.    Patient in the bathroom handed off to NT at end of session.   Therapy Documentation Precautions:  Precautions Precautions: Fall Precaution Comments: monitor sats/HR, post covid debility/deconditioned Restrictions Weight Bearing Restrictions: No    Therapy/Group: Individual Therapy  Haitham Dolinsky L Karalina Tift PT, DPT  08/27/2021, 4:08 PM

## 2021-08-27 NOTE — Progress Notes (Signed)
RN was notified by PT, Cherie, that this patient was complaining of chest pain and had a heart rate that sustained in the 120's while up to the bathroom. RN at beside. Assessed patient's chest pain, per patient chest pain is an aching 2/10 pain. Patients vitals obtained: BP 132/84, HR 102, Resp 18  Patient verbalized that when prior episodes of chest pain occur in the past, she takes metoprolol and it subsides. RN notified Katharine Look, [place]. Katharine Look PA at bedside to assess patient. This RN notified patient's nurse [place].

## 2021-08-27 NOTE — Progress Notes (Signed)
PROGRESS NOTE   Subjective/Complaints: Did not sleep well , mostly requiring sup for ADLs per OT Occ cough blood tinged sputum x 1 reported.  States that she had humidified O2 in ICU     ROS- neg CP, SOB, N/V/D  Objective:   No results found. Recent Labs    08/26/21 0311  WBC 7.1  HGB 9.5*  HCT 29.9*  PLT 97*    Recent Labs    08/25/21 1824 08/26/21 0311  NA  --  135  K  --  3.8  CL  --  97*  CO2  --  31  GLUCOSE 413* 174*  BUN  --  24*  CREATININE  --  0.81  CALCIUM  --  8.8*     Intake/Output Summary (Last 24 hours) at 08/27/2021 0938 Last data filed at 08/27/2021 0700 Gross per 24 hour  Intake 1280 ml  Output --  Net 1280 ml         Physical Exam: Vital Signs Blood pressure 120/74, pulse 86, temperature 98.4 F (36.9 C), temperature source Oral, resp. rate 17, height 5\' 1"  (1.549 m), weight 90.2 kg, SpO2 96 %.   General: No acute distress Mood and affect are appropriate Heart: Regular rate and rhythm no rubs murmurs or extra sounds Lungs: Clear to auscultation, breathing unlabored, no rales or wheezes Abdomen: Positive bowel sounds, soft nontender to palpation, nondistended Extremities: No clubbing, cyanosis, or edema Skin: No evidence of breakdown, no evidence of rash  Neurologic: Cranial nerves II through XII intact, motor strength is 4/5 in bilateral deltoid, bicep, tricep, grip, hip flexor, knee extensors, ankle dorsiflexor and plantar flexor   Musculoskeletal: Full range of motion in all 4 extremities. No joint swelling    Assessment/Plan: 1. Functional deficits which require 3+ hours per day of interdisciplinary therapy in a comprehensive inpatient rehab setting. Physiatrist is providing close team supervision and 24 hour management of active medical problems listed below. Physiatrist and rehab team continue to assess barriers to discharge/monitor patient progress toward functional  and medical goals  Care Tool:  Bathing    Body parts bathed by patient: Right arm, Left arm, Chest, Abdomen, Right upper leg, Front perineal area, Left upper leg, Face   Body parts bathed by helper: Buttocks, Right lower leg, Left lower leg     Bathing assist Assist Level: Moderate Assistance - Patient 50 - 74%     Upper Body Dressing/Undressing Upper body dressing   What is the patient wearing?: Pull over shirt    Upper body assist Assist Level: Supervision/Verbal cueing    Lower Body Dressing/Undressing Lower body dressing      What is the patient wearing?: Pants     Lower body assist Assist for lower body dressing: Contact Guard/Touching assist     Toileting Toileting    Toileting assist Assist for toileting: Minimal Assistance - Patient > 75%     Transfers Chair/bed transfer  Transfers assist     Chair/bed transfer assist level: Contact Guard/Touching assist     Locomotion Ambulation   Ambulation assist      Assist level: Contact Guard/Touching assist Assistive device: Walker-rolling Max distance: 49'   Walk  10 feet activity   Assist     Assist level: Contact Guard/Touching assist Assistive device: Walker-rolling   Walk 50 feet activity   Assist    Assist level: Contact Guard/Touching assist Assistive device: Walker-rolling    Walk 150 feet activity   Assist Walk 150 feet activity did not occur: Safety/medical concerns (Fatigue)         Walk 10 feet on uneven surface  activity   Assist Walk 10 feet on uneven surfaces activity did not occur: Safety/medical concerns (Fatigue)         Wheelchair     Assist Is the patient using a wheelchair?: Yes Type of Wheelchair: Manual    Wheelchair assist level: Dependent - Patient 0%      Wheelchair 50 feet with 2 turns activity    Assist        Assist Level: Dependent - Patient 0%   Wheelchair 150 feet activity     Assist      Assist Level: Dependent  - Patient 0%   Blood pressure 120/74, pulse 86, temperature 98.4 F (36.9 C), temperature source Oral, resp. rate 17, height 5\' 1"  (1.549 m), weight 90.2 kg, SpO2 96 %.    Medical Problem List and Plan: 1. Functional deficits secondary to debility from bacterial and viral PNA community acquired             -patient may  shower             -ELOS/Goals: 5-7d, team conference in a.m. 2.  Antithrombotics: -DVT/anticoagulation:  Pharmaceutical: Other (comment) Eliquis for Afib             -antiplatelet therapy: Plavix 3. Pain Management: OxyIR 10mg  QID, flexeril 10mg  TID prn spasms CLBP- not using IV morphine will d/c 4. Mood: Ativan, 0.5mg  BID, Zoloft 100mg  qd             -antipsychotic agents: NA 5. Neuropsych: This patient is capable of making decisions on her own behalf. 6. Skin/Wound Care: MASD , probable mild candida and shearing , barrier cream, nystatin topical , transfer training  7. Fluids/Electrolytes/Nutrition: Monitor I/Os  8.  COPD prn alb inh, Brovana BID, Pulmicort nebs BID, Montelukast  9.  DM glimipride, metformin, insulin glargine 20U qam, Novalog 8U TID ac CBG (last 3)  Recent Labs    08/26/21 1954 08/26/21 2331 08/27/21 0630  GLUCAP 309* 135* 104*   Am lower , pm elevation noted , wean prednisone to 15mg  tomorrow , reduce glargine to 17U    10.  Hx ESRD, renal transplant 1991, Imuran, prednisone wean from 15mg  to 5mg  change Q3d  11.  HTN- metoprolol Vitals:   08/27/21 0505 08/27/21 0837  BP: 120/74   Pulse: 86   Resp: 17   Temp: 98.4 F (36.9 C)   SpO2: 97% 96%  Controlled 1/11  12.  HLD , PAD crestor, clopidigrel 13.  Constipation due to opioids and immobility- senna-S, Miralax, Linzess      LOS: 2 days A FACE TO FACE EVALUATION WAS PERFORMED  Charlett Blake 08/27/2021, 9:38 AM

## 2021-08-27 NOTE — Progress Notes (Signed)
Physical Therapy Session Note  Patient Details  Name: Katherine [name] MRN: [id number] Date of Birth: 30-Sep-1955  Today's Date: 08/27/2021 PT Individual Time: 3009-2330 PT Individual Time Calculation (min): 45 min  and Today's Date: 08/27/2021 PT Missed Time: 15 Minutes Missed Time Reason: Patient unwilling to participate  Short Term Goals: Week 1:  PT Short Term Goal 1 (Week 1): STG = LTG due to LOS  Skilled Therapeutic Interventions/Progress Updates:    Patient received reclined in bed, stating that she feels "just terrible." RN at bedside providing AM rx. Patient becoming very upset when RN stated that Ativan wasn't scheduled to be given with AM rx. Patient stating "I'm Bipolar and if I don't get my meds I just go crazy." PT providing appropriate redirection and counseling. Patient able to roll with supervision in bed using bed rails for RN to place suppository. Patient voicing frustration at not being able to order what she wants for lunch given that she is on a carb modified diet in the hospital. Patient also very emotional and tearful (sobbing) recounting her lengthy hospital stay. Patient would benefit from neuropsych and/or TR follow up. She did report pain in R LE (reports it's chronic pain) and she did receive pain rx. PT providing rest breaks, distractions and repositioning to assist with pain management. Patient then stating that she "will not be getting out of bed this morning." Patient remaining in bed, bed alarm on, call light within reach.   Therapy Documentation Precautions:  Precautions Precautions: Fall Precaution Comments: monitor sats/HR, post covid debility/deconditioned Restrictions Weight Bearing Restrictions: No    Therapy/Group: Individual Therapy  Karoline Caldwell, PT, DPT, CBIS  08/27/2021, 7:31 AM

## 2021-08-27 NOTE — Discharge Instructions (Addendum)
Information on my medicine - ELIQUIS (apixaban)  Why was Eliquis prescribed for you? Eliquis was prescribed for you to reduce the risk of a blood clot forming that can cause a stroke if you have a medical condition called atrial fibrillation (a type of irregular heartbeat).  What do You need to know about Eliquis ? Take your Eliquis TWICE DAILY - one tablet in the morning and one tablet in the evening with or without food. If you have difficulty swallowing the tablet whole please discuss with your pharmacist how to take the medication safely.  Take Eliquis exactly as prescribed by your doctor and DO NOT stop taking Eliquis without talking to the doctor who prescribed the medication.  Stopping may increase your risk of developing a stroke.  Refill your prescription before you run out.  After discharge, you should have regular check-up appointments with your healthcare provider that is prescribing your Eliquis.  In the future your dose may need to be changed if your kidney function or weight changes by a significant amount or as you get older.  What do you do if you miss a dose? If you miss a dose, take it as soon as you remember on the same day and resume taking twice daily.  Do not take more than one dose of ELIQUIS at the same time to make up a missed dose.  Important Safety Information A possible side effect of Eliquis is bleeding. You should call your healthcare provider right away if you experience any of the following: Bleeding from an injury or your nose that does not stop. Unusual colored urine (red or dark brown) or unusual colored stools (red or black). Unusual bruising for unknown reasons. A serious fall or if you hit your head (even if there is no bleeding).  Some medicines may interact with Eliquis and might increase your risk of bleeding or clotting while on Eliquis. To help avoid this, consult your healthcare provider or pharmacist prior to using any new prescription or  non-prescription medications, including herbals, vitamins, non-steroidal anti-inflammatory drugs (NSAIDs) and supplements.  This website has more information on Eliquis (apixaban): http://[email or web address].com/eliquis/home    COMMUNITY REFERRALS UPON DISCHARGE:    Home Health:   PT   OT  RN                 [place] Phone: [phone]    Medical Equipment/Items Ordered:                                                 Agency/Supplier:  Inpatient Rehab Discharge Instructions  [place] Threats Discharge date and time: No discharge date for patient encounter.   Activities/Precautions/ Functional Status: Activity: activity as tolerated Diet: diabetic diet Wound Care: Routine skin checks Functional status:  ___ No restrictions     ___ Walk up steps independently ___ 24/7 supervision/assistance   ___ Walk up steps with assistance ___ Intermittent supervision/assistance  ___ Bathe/dress independently ___ Walk with walker     _x__ Bathe/dress with assistance ___ Walk Independently    ___ Shower independently ___ Walk with assistance    ___ Shower with assistance ___ No alcohol     ___ Return to work/school ________  Special Instructions:  No driving smoking or alcohol  My questions have been answered and I understand these instructions. I will adhere to these goals  and the provided educational materials after my discharge from the hospital.  Patient/Caregiver Signature _______________________________ Date __________  Clinician Signature _______________________________________ Date __________  Please bring this form and your medication list with you to all your follow-up doctor's appointments.

## 2021-08-27 NOTE — Progress Notes (Signed)
Made aware of patient's episode of left anterior chest pain upon returning from break. Katharine Look PA notified.    Gladstone Lighter, LPN

## 2021-08-27 NOTE — Progress Notes (Signed)
Patient medicated for pain with Oxycodone at 2050.Writer called to room to assist with personal care.Observed this patient up on [facility], upon entering the room with her assigned NTElta Guadeloupe), She was assisted back to bed x 2 staff. Patient was repositioned and made as comfortable as possible.She c/o of excruciating pain to her left leg stating "it is hurting and aching pain, like arthritic knee pains down in my bone. Verbalized she had a hard day in therapy today and this may be causing this discomfort,facial grimaces.noted, requesting her Tylenol po and medicated with prn Tylenol 650 mg po.

## 2021-08-27 NOTE — Progress Notes (Signed)
Occupational Therapy Session Note  Patient Details  Name: Katherine [name] MRN: [id number] Date of Birth: June 24, 1956  Today's Date: 08/27/2021 OT Individual Time: 1120-1220 OT Individual Time Calculation (min): 60 min    Short Term Goals: Week 1:  OT Short Term Goal 1 (Week 1): STGs = LTGs 2/2 LOS Week 2:    Week 3:     Skilled Therapeutic Interventions/Progress Updates:    The pt was lying in bed upon arrival, the pt was able to come from supine to EOB with vc's only for the completion of simple BADL related task.  The pt has 3L of O2 in place with an O2 read of 97% prior to engaging in activities.  She was able to ambulate to  the restroom using the RW for dynamic balance with CGA.  The pt was able to donn and doff her diaper with MinA for fasteners, she was able to transfer to the w/c with CGA.  The pt completed UB therex using a towel for AROM for shld flexion, horizontal abduction, and shld rotation 2 sets 10 with rest breaks as needed , the pt required 3 rest breaks  The pt completed sit to stand 4x with attention to safety and endurance and was able to follow up with a function task by standing at the sink and combing her hair.  The pt returned to bed level for lunch with SBA for positioning, her lunch was in place with s/u assist for containers with her call bell within reach.  The pt had a pain response of 4 on a 0-10 scale associate with her buttocks, nursing was made aware.  Therapy Documentation Precautions:  Precautions Precautions: Fall Precaution Comments: monitor sats/HR, post covid debility/deconditioned Restrictions Weight Bearing Restrictions: No General: General PT Missed Treatment Reason: Patient unwilling to participate Vital Signs:   Pain: Pain Assessment Pain Scale: 0-10 Pain Score: 0-No pain A Vision   Perception    Praxis   Balance   Exercises:   Other Treatments:     Therapy/Group: Individual Therapy  Yvonne Kendall 08/27/2021, 12:38  PM

## 2021-08-28 ENCOUNTER — Inpatient Hospital Stay (HOSPITAL_COMMUNITY): Payer: Medicare Other

## 2021-08-28 DIAGNOSIS — R5381 Other malaise: Secondary | ICD-10-CM | POA: Diagnosis not present

## 2021-08-28 DIAGNOSIS — M79604 Pain in right leg: Secondary | ICD-10-CM

## 2021-08-28 DIAGNOSIS — M79605 Pain in left leg: Secondary | ICD-10-CM

## 2021-08-28 LAB — GLUCOSE, CAPILLARY
Glucose-Capillary: 164 mg/dL — ABNORMAL HIGH (ref 70–99)
Glucose-Capillary: 147 mg/dL — ABNORMAL HIGH (ref 70–99)
Glucose-Capillary: 282 mg/dL — ABNORMAL HIGH (ref 70–99)
Glucose-Capillary: 241 mg/dL — ABNORMAL HIGH (ref 70–99)

## 2021-08-28 MED ORDER — GABAPENTIN 100 MG PO CAPS
100.0000 mg | ORAL_CAPSULE | Freq: Every day | ORAL | Status: DC
  Administered 2021-08-28 – 2021-09-01 (×4): 100 mg via ORAL
  Filled 2021-08-28 (×5): qty 1

## 2021-08-28 NOTE — Progress Notes (Signed)
Occupational Therapy Session Note  Patient Details  Name: Katherine [name] MRN: [id number] Date of Birth: 1956-06-20  Today's Date: 08/28/2021 OT Missed Time: 52 Minutes Missed Time Reason: Patient unwilling/refused to participate without medical reason;Pain   Short Term Goals: Week 1:  OT Short Term Goal 1 (Week 1): STGs = LTGs 2/2 LOS  Skilled Therapeutic Interventions/Progress Updates:    Pt received in bed and immediately stated, " I am on bedrest and not to get up until I have had an ultrasound".  Orders checked and no bed rest order or [place] order.  Returned to room to tell pt that the orders were not in and we can try some light movement, but pt refused stating "I am NOT getting out of bed until I receive and ultrasound! And my leg is still really hurting".  MD has not had the opportunity to round on this patient this morning.    Therapy Documentation Precautions:  Precautions Precautions: Fall Precaution Comments: monitor sats/HR, post covid debility/deconditioned Restrictions Weight Bearing Restrictions: No    Vital Signs: Therapy Vitals Temp: 98.6 F (37 C) Temp Source: Oral Pulse Rate: 81 Resp: 16 BP: 140/66 Patient Position (if appropriate): Lying Oxygen Therapy SpO2: 100 % Pain: Pain Assessment Pain Scale: 0-10 Pain Score: 8  Pain Type: Chronic pain Pain Location: Leg Pain Orientation: Right Pain Descriptors / Indicators: Aching Pain Frequency: Constant Pain Onset: On-going Pain Intervention(s): Medication (See eMAR) Multiple Pain Sites: No   Therapy/Group: Individual Therapy  Mikalyn Hermida 08/28/2021, 8:29 AM

## 2021-08-28 NOTE — IPOC Note (Signed)
Overall Plan of Care [facility]) Patient Details Name: Rifka Ramey MRN: [id number] DOB: 1956-08-12  Admitting Diagnosis: [facility] Problems: Principal Problem:   Debility Active Problems:   Pneumonia, viral   Post covid-19 condition, unspecified     Functional Problem List: Nursing Bladder, Bowel, Endurance, Medication Management, Safety, Pain  PT Balance, Endurance, Motor, Safety, Sensory  OT Balance, Endurance, Motor, Safety  SLP    TR         Basic ADLs: OT Bathing, Grooming, Dressing, Toileting     Advanced  ADLs: OT       Transfers: PT Bed to Chair, Bed Mobility, Car, Manufacturing systems engineer, Metallurgist: PT Ambulation, Emergency planning/management officer, Stairs     Additional Impairments: OT None  SLP        TR      Anticipated Outcomes Item Anticipated Outcome  Self Feeding no goal  Swallowing      Basic self-care  Mod I  Toileting  Mod I   Bathroom Transfers Mod I  Bowel/Bladder  manage bowel w mod I and bladder without assistance  Transfers  Mod I  Locomotion  Mod I  Communication     Cognition     Pain  Pain at or below level 4 with prn meds  Safety/Judgment  Maintain safety with cues/reminders   Therapy Plan: PT Intensity: Minimum of 1-2 x/day ,45 to 90 minutes PT Frequency: 5 out of 7 days PT Duration Estimated Length of Stay: 7-10 days OT Intensity: Minimum of 1-2 x/day, 45 to 90 minutes OT Frequency: 5 out of 7 days OT Duration/Estimated Length of Stay: 7-10 days     Due to the current state of emergency, patients may not be receiving their 3-hours of Medicare-mandated therapy.   Team Interventions: Nursing Interventions Bladder Management, Disease Management/Prevention, Medication Management, Discharge Planning, Pain Management, Bowel Management, Patient/Family Education  PT interventions Ambulation/gait training, Community reintegration, DME/adaptive equipment instruction, Neuromuscular re-education,  Psychosocial support, Stair training, UE/LE Strength taining/ROM, Wheelchair propulsion/positioning, UE/LE Coordination activities, Therapeutic Activities, Skin care/wound management, Pain management, Functional electrical stimulation, Discharge planning, Training and development officer, Cognitive remediation/compensation, Disease management/prevention, Functional mobility training, Patient/family education, Splinting/orthotics, Therapeutic Exercise, Visual/perceptual remediation/compensation  OT Interventions Balance/vestibular training, Discharge planning, Self Care/advanced ADL retraining, Therapeutic Activities, UE/LE Coordination activities, Visual/perceptual remediation/compensation, Therapeutic Exercise, Skin care/wound managment, Patient/family education, Functional mobility training, Disease mangement/prevention, Cognitive remediation/compensation, Community reintegration, Engineer, drilling, Neuromuscular re-education, Psychosocial support, Splinting/orthotics, UE/LE Strength taining/ROM  SLP Interventions    TR Interventions    SW/CM Interventions Discharge Planning, Psychosocial Support, Patient/Family Education   Barriers to Discharge MD  Medical stability and Weight  Nursing Decreased caregiver support, Home environment access/layout, Incontinence, Wound Care 1 level 5 ste bil rails with disabled brother, friend and niece  PT Decreased caregiver support, Home environment access/layout, Weight    OT Decreased caregiver support, Home environment access/layout    SLP      SW       Team Discharge Planning: Destination: PT-Home ,OT- Home , SLP-  Projected Follow-up: PT-Home health PT, OT-  Home health OT, SLP-  Projected Equipment Needs: PT-To be determined, OT- To be determined, SLP-  Equipment Details: PT- , OT-  Patient/family involved in discharge planning: PT- Patient,  OT-Patient, SLP-   MD ELOS: 7-10d Medical Rehab Prognosis:  Fair Assessment:  Ilisha L.  Lanpher is a 66 year old female with history of CAD s/p CABG/PCI- last 07/22, PAD, PAF/torsades Hx- on eliquis, chronic diastolic CHF,  OSA--no  CPAP, T2DM with neuropathy, renal transplant, RA/FM w/chronic pain, COPD-3 L oxygen dependent who was admitted to [facility] on 07/25/21 with mild confusion, SOB with hypoxia despite increase in oxygen needs post exposure to flu, low grade fever/chills, diarrhea, pleuritic chest pain and generalized weakness.   She was found to have acute on chronic respiratory failure due to Covid PNA and treated with steriods, Remdisvir and Ceftriaxone. Hospital course significant for weakness with ongoing hypoxia requiring high flow oxygen as well as poorly controlled BS. On 12/24, she developed significant CP despite NTG and morphine. EKG showed diffuse ST and T wave abnormalities,  CXR showed acute on chronic fluid CHF as well as elevation in troponin's due to NSTEMI. She was transitioned to IV heparin, treated diuresis but continued to have rise in troponin with increase in oxygen needs.    Dr. Justine Null was consulted to assist with management of anxiety/depression and she was started on ativan bid in addition to increase in Zolfot to 250 mg/daily without SE.    She was taken to cath lab on 01/05 but found to have stenosis obstructing procedure and procedure aborted. Medical treatment recommended per Dr. Ronney Asters.      See Team Conference Notes for weekly updates to the plan of care

## 2021-08-28 NOTE — Progress Notes (Signed)
Patient ID: Katherine [name], female   DOB: 1955/08/22, 66 y.o.   MRN: [id number]  Met with pt to see how she is doing. She told this worker about the pain in her leg she had last night and hopes it does not happen again tonight. She is having a MRI later today to check. Pt reports she is doing well otherwise and progressing. She does not feel Erica-god daughter will need to come in for education prior to her discharging home. She can self direct her care with her. She reports with her fibromyalgia she has good days and bad days. On her good days she moves more and on her bad days she does not. She knows what to do for herself at home. Will continue to follow and provide support to pt. See her tomorrow to see how MRI went.

## 2021-08-28 NOTE — Progress Notes (Signed)
Physical Therapy Session Note  Patient Details  Name: Katherine [name] MRN: [id number] Date of Birth: Dec 15, 1955  Today's Date: 08/28/2021 PT Individual Time: 1115-1130 PT Individual Time Calculation (min): 15 min  and Today's Date: 08/28/2021 PT Missed Time: 30 Minutes Missed Time Reason: Patient unwilling to participate  Short Term Goals: Week 1:  PT Short Term Goal 1 (Week 1): STG = LTG due to LOS  Skilled Therapeutic Interventions/Progress Updates:  Pt received supine in bed, denied pain at rest but informed therapist that she would "not be getting out of bed today because I cannot tolerate having pain like I did last night". Encouraged pt to attempt bed-level exercises, but pt refused. Spent time educating pt on importance of movement for reduction of pain, reduced risk of DVTs and informed pt that she would need to make up therapy time. Pt became very agitated and told therapist she "was not a wimp" and "I am telling you that I am not moving today". Informed pt of therapy session scheduled for later in the afternoon, pt reported "do not waste your time". Pt was left supine in bed, missed 30 minutes of skilled PT due to pt refusal.   Therapy Documentation Precautions:  Precautions Precautions: Fall Precaution Comments: monitor sats/HR, post covid debility/deconditioned Restrictions Weight Bearing Restrictions: No   Therapy/Group: Individual Therapy Cruzita Lederer Cathy Ropp, PT, DPT  08/28/2021, 9:21 AM

## 2021-08-28 NOTE — Progress Notes (Signed)
Physical Therapy Session Note  Patient Details  Name: Katherine [name] MRN: [id number] Date of Birth: Jun 21, 1956  Today's Date: 08/28/2021 PT Missed Time: 75 Minutes Missed Time Reason: Patient unwilling to participate  Short Term Goals: Week 1:  PT Short Term Goal 1 (Week 1): STG = LTG due to LOS  Skilled Therapeutic Interventions/Progress Updates:  Upon entrance to room, pt supine in bed and immediately refused therapy, citing RLE pain, nausea and perseverating on DVTs despite earlier education regarding outcome of Ultrasound. Missed 75 minutes of skilled PT due to pt refusal.   Therapy Documentation Precautions:  Precautions Precautions: Fall Precaution Comments: monitor sats/HR, post covid debility/deconditioned Restrictions Weight Bearing Restrictions: No   Therapy/Group: Individual Therapy [name], PT, DPT  08/28/2021, 3:40 PM

## 2021-08-28 NOTE — Progress Notes (Signed)
Physical Therapy Session Note  Patient Details  Name: Katherine [name] MRN: [id number] Date of Birth: Sep 16, 1955  Today's Date: 08/28/2021 PT Missed Time: 50 Minutes Missed Time Reason: Patient unwilling to participate  Upon entrance into room, pt with emesis bag in hand and refusing to participate in therapy session due to leg pain and being fearful of movement. Unable to convince and encourage her to participate despite educating her on results of DVT study. She missed 30 minutes of skilled therapy due to refusal.   Raffaella Edison P Rolla Kedzierski PT 08/28/2021, 7:48 AM

## 2021-08-28 NOTE — Progress Notes (Signed)
Pt called Nurse station stating that she couldn't breathe. This nurse was exiting another pt room when notified and entered pt room with NT Mark. Pt found laying in bed, crying and hyperventilating stating "My leg, My leg". This nurse tried to assess the pain and asking questions and the pt responded with "Loma Sousa, why aren't you being more sympathetic". This nurse explained that I needed to find out the pain and she kept saying "it's my entire leg. I feel it in my bones but unable to verify pain or tell me where it radiates. This nurse called on call PA and got order to rule out  DVT. Pt calmed and stated that her room quit throbbing as much. When this nurse asked if pain was throbbing she became defensive saying "NO, its everywhere, I don't know what kind of pain it is, it just hurts." Pt asked when she could get more pain meds and was advised she wasn't due until 2:50 a.m and patient became emotional again. NT mark was able to calm her with physical touch. Pt advised that we will wait for results before moving forward with treatment. Pt understood. Pt denies any other needs, call light within reach, will continue to monitor.

## 2021-08-28 NOTE — Progress Notes (Signed)
PROGRESS NOTE   Subjective/Complaints: CP x 1 yesterday , cardiology contacted rec restarting Toprol , pt fels that this may have been GI related issue, mainly epigastric  Also c/o LE pain RIght side, no falls or new trauma , no numbness or tingling, had similar less intense symptoms occasionally at home  LE doppler neg   ROS- neg CP, SOB, N/V/D  Objective:   No results found. Recent Labs    08/26/21 0311  WBC 7.1  HGB 9.5*  HCT 29.9*  PLT 97*    Recent Labs    08/25/21 1824 08/26/21 0311  NA  --  135  K  --  3.8  CL  --  97*  CO2  --  31  GLUCOSE 413* 174*  BUN  --  24*  CREATININE  --  0.81  CALCIUM  --  8.8*     Intake/Output Summary (Last 24 hours) at 08/28/2021 0756 Last data filed at 08/28/2021 0316 Gross per 24 hour  Intake 640 ml  Output 4 ml  Net 636 ml         Physical Exam: Vital Signs Blood pressure 140/66, pulse 81, temperature 98.6 F (37 C), temperature source Oral, resp. rate 16, height 5\' 1"  (1.549 m), weight 91.7 kg, SpO2 100 %.   General: No acute distress Mood and affect are appropriate Heart: Regular rate and rhythm no rubs murmurs or extra sounds Lungs: Clear to auscultation, breathing unlabored, no rales or wheezes Abdomen: Positive bowel sounds, soft nontender to palpation, nondistended Extremities: No clubbing, cyanosis, or edema Skin: No evidence of breakdown, no evidence of rash  Neurologic: Cranial nerves II through XII intact, motor strength is 4/5 in bilateral deltoid, bicep, tricep, grip, hip flexor, knee extensors, ankle dorsiflexor and plantar flexor   Musculoskeletal: Full range of motion in all 4 extremities. No joint swelling    Assessment/Plan: 1. Functional deficits which require 3+ hours per day of interdisciplinary therapy in a comprehensive inpatient rehab setting. Physiatrist is providing close team supervision and 24 hour management of active medical  problems listed below. Physiatrist and rehab team continue to assess barriers to discharge/monitor patient progress toward functional and medical goals  Care Tool:  Bathing    Body parts bathed by patient: Right arm, Left arm, Chest, Abdomen, Right upper leg, Front perineal area, Left upper leg, Face   Body parts bathed by helper: Buttocks, Right lower leg, Left lower leg     Bathing assist Assist Level: Moderate Assistance - Patient 50 - 74%     Upper Body Dressing/Undressing Upper body dressing   What is the patient wearing?: Pull over shirt    Upper body assist Assist Level: Supervision/Verbal cueing    Lower Body Dressing/Undressing Lower body dressing      What is the patient wearing?: Pants     Lower body assist Assist for lower body dressing: Contact Guard/Touching assist     Toileting Toileting    Toileting assist Assist for toileting: Moderate Assistance - Patient 50 - 74%     Transfers Chair/bed transfer  Transfers assist     Chair/bed transfer assist level: Moderate Assistance - Patient 50 - 74%  Locomotion Ambulation   Ambulation assist      Assist level: Contact Guard/Touching assist Assistive device: Walker-rolling Max distance: 50'   Walk 10 feet activity   Assist     Assist level: Contact Guard/Touching assist Assistive device: Walker-rolling   Walk 50 feet activity   Assist    Assist level: Contact Guard/Touching assist Assistive device: Walker-rolling    Walk 150 feet activity   Assist Walk 150 feet activity did not occur: Safety/medical concerns (Fatigue)         Walk 10 feet on uneven surface  activity   Assist Walk 10 feet on uneven surfaces activity did not occur: Safety/medical concerns (Fatigue)         Wheelchair     Assist Is the patient using a wheelchair?: Yes Type of Wheelchair: Manual    Wheelchair assist level: Dependent - Patient 0%      Wheelchair 50 feet with 2 turns  activity    Assist        Assist Level: Dependent - Patient 0%   Wheelchair 150 feet activity     Assist      Assist Level: Dependent - Patient 0%   Blood pressure 140/66, pulse 81, temperature 98.6 F (37 C), temperature source Oral, resp. rate 16, height 5\' 1"  (1.549 m), weight 91.7 kg, SpO2 100 %.    Medical Problem List and Plan: 1. Functional deficits secondary to debility from bacterial and viral PNA community acquired             -patient may  shower             -ELOS/Goals: 5-7d, Pt declined therapy today , she is afraid her pain may be aggravated, discussed that it would be safe medically to resume therapy , encouraged pt to participate  2.  Antithrombotics: -DVT/anticoagulation:  Pharmaceutical: Other (comment) Eliquis for Afib             -antiplatelet therapy: Plavix 3. Pain Management: OxyIR 10mg  QID, flexeril 10mg  TID prn spasms RLE pain - pt states this was severe but has improved today .  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 Mood: Ativan, 0.5mg  BID, Zoloft 100mg  qd             -antipsychotic agents: NA 5. Neuropsych: This patient is capable of making decisions on her own behalf. 6. Skin/Wound Care: MASD , probable mild candida and shearing , barrier cream, nystatin topical , transfer training  7. Fluids/Electrolytes/Nutrition: Monitor I/Os  8.  COPD prn alb inh, Brovana BID, Pulmicort nebs BID, Montelukast  9.  DM glimipride, metformin, insulin glargine 20U qam, Novalog 8U TID ac CBG (last 3)  Recent Labs    08/27/21 1642 08/27/21 2120 08/28/21 0635  GLUCAP 286* 134* 164*   Am lower , pm elevation noted , wean prednisone to 15mg  tomorrow , reduce glargine to 17U    10.  Hx ESRD, renal transplant 1991, Imuran, prednisone wean from  15mg  to 5mg  change Q3d  11.  HTN- metoprolol Vitals:   08/27/21 2026 08/28/21 0536  BP:  140/66  Pulse:  81  Resp:  16  Temp:  98.6 F (37 C)  SpO2: 96% 100%  Controlled 1/12  12.  HLD , PAD crestor, clopidigrel  13.  Constipation due to opioids and immobility- senna-S, Miralax, Linzess      LOS: 3 days A FACE TO FACE EVALUATION WAS PERFORMED  Charlett Blake 08/28/2021, 7:56 AM

## 2021-08-28 NOTE — Progress Notes (Signed)
Bilateral lower extremity venous duplex completed. Refer to "CV Proc" under chart review to view preliminary results.  08/28/2021 9:29 AM Kelby Aline., MHA, RVT, RDCS, RDMS

## 2021-08-29 ENCOUNTER — Inpatient Hospital Stay (HOSPITAL_COMMUNITY): Payer: Medicare Other

## 2021-08-29 DIAGNOSIS — R5381 Other malaise: Secondary | ICD-10-CM | POA: Diagnosis not present

## 2021-08-29 LAB — GLUCOSE, CAPILLARY
Glucose-Capillary: 99 mg/dL (ref 70–99)
Glucose-Capillary: 135 mg/dL — ABNORMAL HIGH (ref 70–99)
Glucose-Capillary: 199 mg/dL — ABNORMAL HIGH (ref 70–99)
Glucose-Capillary: 156 mg/dL — ABNORMAL HIGH (ref 70–99)

## 2021-08-29 MED ORDER — FUROSEMIDE 40 MG PO TABS
40.0000 mg | ORAL_TABLET | Freq: Every day | ORAL | Status: AC
  Administered 2021-08-29: 40 mg via ORAL
  Filled 2021-08-29: qty 1

## 2021-08-29 MED ORDER — METFORMIN HCL 500 MG PO TABS
1000.0000 mg | ORAL_TABLET | Freq: Every day | ORAL | Status: DC
  Administered 2021-08-30 – 2021-09-02 (×4): 1000 mg via ORAL
  Filled 2021-08-29 (×5): qty 2

## 2021-08-29 MED ORDER — PREDNISONE 5 MG PO TABS
10.0000 mg | ORAL_TABLET | Freq: Every day | ORAL | Status: DC
  Administered 2021-08-30 – 2021-09-02 (×4): 10 mg via ORAL
  Filled 2021-08-29 (×4): qty 2

## 2021-08-29 NOTE — Progress Notes (Signed)
Physical Therapy Session Note  Patient Details  Name: Katherine [name] MRN: [id number] Date of Birth: 1955/08/22  Today's Date: 08/29/2021 PT Individual Time: 8016-5537 PT Individual Time Calculation (min): 46 min   Short Term Goals: Week 1:  PT Short Term Goal 1 (Week 1): STG = LTG due to LOS  Skilled Therapeutic Interventions/Progress Updates:  Patient supine in bed on entrance to room. On 2L O2. Pt is finishing up breakfast and complains that she has not had her morning medications yet. Requests pain medication and ativan. Patient alert and only agreeable to PT session after she has received her medication. When RN arrives in room, pt relates 10/10 pain in R leg. SpO2 is WFL throughout session.   Patient with changing pain complaint throughout session.  Therapeutic Activity: Bed Mobility: Patient performed supine --> sit with CGA/minA for final push up to seated position from L sidelying. VC/ tc required for technique and effort. Pt sits EOB to swallow medications in improved posture. Continues to sit upright for extra 68min and is able to complete washing face with washcloth, combing hair and putting in ponytail all without UE support that pt required initially on rising to sit. No pain reported in UE.  Transfers: Patient performed sit<>stand and stand pivot transfers throughout session with CGA. Provided verbal cues for effort as pt requires elevated bed surface for first sit<>stand from bed. MinA required initially with pt improving to CGA throughout to/ from w/c.   Gait Training:  Patient ambulated 84' x1/ 54' x1 using RW with CGA. Demonstrated forward flexed posture and looks down at floor. Provided vc/ tc for upright posture, decreased downward pressure of BUE into walker, level gaze. While ambulating, pt relates that her BLE do not burt when she is "out of the room". At this time, it is her BUE that are weak and sore. Educ provided that with heavy push down into walker, pt is using  BUE to assist with holding self up and will then feel sore/ weak d/t increased use.   Patient seated upright on EOB at end of session with brakes locked, bed alarm set, and all needs within reach. NT arriving to give pt CHG bath.   Therapy Documentation Precautions:  Precautions Precautions: Fall Precaution Comments: monitor sats/HR, post covid debility/deconditioned Restrictions Weight Bearing Restrictions: No General: PT Amount of Missed Time (min): 14 Minutes PT Missed Treatment Reason: Nursing care Vital Signs:   Pain:  Initially relates 10/10 pain to nurse in RLE. Then relates no pain in RLE when walking, but weakness/ soreness in BUE.   Therapy/Group: Individual Therapy  Alger Simons PT, DPT 08/29/2021, 10:18 AM

## 2021-08-29 NOTE — Progress Notes (Addendum)
Patient ID: Katherine [name], female   DOB: 22-Apr-1956, 66 y.o.   MRN: [id number] Therapy team feels pt will not be ready for discharge on Monday 1/16. Will need to do six stairs to get into home and has not done this. Has missed therapies due to medical issues also. Pt is on board with staying past Monday. Will need to re-conference Tuesday 1/17 to set new date-possibly 1/18-Wednesday. MD on board with and reports another MD will have her next week, to follow up with them. Pt is agreeable to work on stairs since she has four to get into her home. Pt will also need to participate in therapies to reach her goals. She is aware of this. Katherine [name]-PT to talk with her also. Pt has asked what her lab work is worried about her kidney function due to having a transplant. Asked bedside RN to answer her questions. Continue to work on discharge needs.

## 2021-08-29 NOTE — Progress Notes (Signed)
PROGRESS NOTE   Subjective/Complaints: No further CP Also c/o LE pain RIght side, no falls or new trauma , no numbness or tingling, had similar less intense symptoms occasionally at home  LE doppler neg   Reviewed MRI of lumbar spine, no compressive lesions to explain RLE discomfort   ROS- neg CP, SOB, N/V/D  Objective:   MR LUMBAR SPINE WO CONTRAST  Result Date: 08/29/2021 CLINICAL DATA:  Initial evaluation for low back pain with right lower extremity pain. Immunocompromised. EXAM: MRI LUMBAR SPINE WITHOUT CONTRAST TECHNIQUE: Multiplanar, multisequence MR imaging of the lumbar spine was performed. No intravenous contrast was administered. COMPARISON:  Prior MRI from 08/25/2017. FINDINGS: Segmentation: Standard. Lowest well-formed disc space labeled the L5-S1 level. Alignment: 4 mm anterolisthesis of L4 on L5, chronic and facet mediated, and mildly progressed from prior. Trace retrolisthesis of T11 on T12, stable. Alignment otherwise normal with preservation of the normal lumbar lordosis. Vertebrae: Vertebral body height maintained without acute or chronic fracture. Bone marrow signal intensity within normal limits. No discrete or worrisome osseous lesions or abnormal marrow edema. Conus medullaris and cauda equina: Conus extends to the L1-2 level. Conus and cauda equina appear normal. Paraspinal and other soft tissues: Paraspinous soft tissues demonstrate no acute finding. The visualized native kidneys are markedly atrophic. Multifocal atheromatous irregularity seen throughout the intra-abdominal aorta without visible aneurysm. Disc levels: T10-11: Seen only on sagittal projection. Intervertebral disc space narrowing with disc desiccation and mild disc bulge. Bilateral facet hypertrophy. No significant spinal stenosis. Foramina appear grossly patent. T11-12: Intervertebral disc space narrowing with mild disc bulge and disc desiccation. No  significant stenosis. T12-L1: Unremarkable. L1-2: Disc desiccation without significant disc bulge. No canal or foraminal stenosis. L2-3: Disc desiccation without significant disc bulge. No canal or foraminal stenosis. L3-4: Mild intervertebral disc space narrowing with diffuse disc bulge. Superimposed broad-based left foraminal to extraforaminal disc protrusion contacts the exiting left L3 nerve root (series 6, image 24). Associated annular fissure. Mild bilateral facet hypertrophy. No significant spinal stenosis. Mild left L3 foraminal narrowing. Right neural foramina remains patent. L4-5: 4 mm anterolisthesis. Mild disc bulge with disc desiccation. Superimposed left foraminal to extraforaminal disc protrusion contacts the exiting left L4 nerve root (series 3, image 12). Advanced bilateral facet arthrosis, progressed from previous. No significant canal or lateral recess stenosis. Mild left greater than right L4 foraminal narrowing. L5-S1: Broad-based left eccentric disc bulge closely approximates and/or contacts the exiting left L5 nerve root (series 6, image 35). Moderate bilateral facet arthrosis. No significant spinal stenosis. Mild left L5 foraminal narrowing without impingement. Right foramen remains patent. IMPRESSION: 1. Broad-based left foraminal to extraforaminal disc protrusions at L3-4 and L4-5, contacting and potentially irritating the exiting left L3 and L4 nerve roots respectively. 2. Broad-based left eccentric disc bulge at L5-S1, closely approximating and/or potentially irritating the exiting left L5 nerve root. 3. Advanced bilateral facet arthrosis at L4-5 with associated 4 mm anterolisthesis, mildly progressed as compared to 2019. 4. No other acute abnormality within the lumbar spine. No findings to explain patient's right lower extremity symptoms identified. Aortic Atherosclerosis (ICD10-I70.0). Electronically Signed   By: Jeannine Boga M.D.   On: 08/29/2021 03:39   VAS  [place] LOWER  EXTREMITY VENOUS (DVT)  Result Date: 08/28/2021  Lower Venous DVT Study Patient Name:  Katherine [name]  Date of Exam:   08/28/2021 Medical Rec #: [id number]           Accession #:    [id number] Date of Birth: [birth year]           Patient Gender: F Patient Age:   66 years Exam Location:  [facility] Procedure:      VAS [place] LOWER EXTREMITY VENOUS (DVT) Referring Phys: Alysia Penna --------------------------------------------------------------------------------  Indications: Pain.  Limitations: Body habitus and poor ultrasound/tissue interface. Comparison Study: 08/09/2021- negative bilateral lower extremity venous duplex Performing Technologist: Maudry Mayhew MHA, RDMS, RVT, RDCS  Examination Guidelines: A complete evaluation includes B-mode imaging, spectral Doppler, color Doppler, and power Doppler as needed of all accessible portions of each vessel. Bilateral testing is considered an integral part of a complete examination. Limited examinations for reoccurring indications may be performed as noted. The reflux portion of the exam is performed with the patient in reverse Trendelenburg.  +---------+---------------+---------+-----------+----------+--------------+  RIGHT     Compressibility Phasicity Spontaneity Properties Thrombus Aging  +---------+---------------+---------+-----------+----------+--------------+  CFV       Full            Yes       Yes                                    +---------+---------------+---------+-----------+----------+--------------+  SFJ       Full                                                             +---------+---------------+---------+-----------+----------+--------------+  FV Prox   Full                                                             +---------+---------------+---------+-----------+----------+--------------+  FV Mid    Full                                                              +---------+---------------+---------+-----------+----------+--------------+  FV Distal Full                                                             +---------+---------------+---------+-----------+----------+--------------+  PFV       Full                                                             +---------+---------------+---------+-----------+----------+--------------+  POP       Full            Yes       Yes                                    +---------+---------------+---------+-----------+----------+--------------+  PTV       Full                                                             +---------+---------------+---------+-----------+----------+--------------+  PERO      Full                                                             +---------+---------------+---------+-----------+----------+--------------+   Right Technical Findings: Not visualized segments include limited visualization PTV and peroneal veins.  +---------+---------------+---------+-----------+----------+--------------+  LEFT      Compressibility Phasicity Spontaneity Properties Thrombus Aging  +---------+---------------+---------+-----------+----------+--------------+  CFV       Full            Yes       Yes                                    +---------+---------------+---------+-----------+----------+--------------+  SFJ       Full                                                             +---------+---------------+---------+-----------+----------+--------------+  FV Prox   Full                                                             +---------+---------------+---------+-----------+----------+--------------+  FV Mid    Full                                                             +---------+---------------+---------+-----------+----------+--------------+  FV Distal Full                                                             +---------+---------------+---------+-----------+----------+--------------+  PFV       Full                                                              +---------+---------------+---------+-----------+----------+--------------+  POP       Full            Yes       Yes                                    +---------+---------------+---------+-----------+----------+--------------+  PTV       Full                                                             +---------+---------------+---------+-----------+----------+--------------+  PERO      Full                                                             +---------+---------------+---------+-----------+----------+--------------+   Left Technical Findings: Not visualized segments include limited visualization PTV and peroneal veins.   Summary: RIGHT: - There is no evidence of deep vein thrombosis in the lower extremity. However, portions of this examination were limited- see technologist comments above.  - No cystic structure found in the popliteal fossa.  LEFT: - There is no evidence of deep vein thrombosis in the lower extremity. However, portions of this examination were limited- see technologist comments above.  - No cystic structure found in the popliteal fossa.  *See table(s) above for measurements and observations. Electronically signed by Orlie Pollen on 08/28/2021 at 3:58:46 PM.    Final    No results for input(s): WBC, HGB, HCT, PLT in the last 72 hours.  No results for input(s): NA, K, CL, CO2, GLUCOSE, BUN, CREATININE, CALCIUM in the last 72 hours.   Intake/Output Summary (Last 24 hours) at 08/29/2021 0905 Last data filed at 08/29/2021 0848 Gross per 24 hour  Intake 720 ml  Output --  Net 720 ml         Physical Exam: Vital Signs Blood pressure 139/69, pulse 78, temperature 98.3 F (36.8 C), resp. rate 19, height 5\' 1"  (1.549 m), weight 96.2 kg, SpO2 97 %.   General: No acute distress Mood and affect are appropriate Heart: Regular rate and rhythm no rubs murmurs or extra sounds Lungs: Clear to auscultation, breathing unlabored,  no rales or wheezes Abdomen: Positive bowel sounds, soft nontender to palpation, nondistended Extremities: No clubbing, cyanosis, or edema Skin: No evidence of breakdown, no evidence of rash  Neurologic: Cranial nerves II through XII intact, motor strength is 4/5 in bilateral deltoid, bicep, tricep, grip, hip flexor, knee extensors, ankle dorsiflexor and plantar flexor   Musculoskeletal: Full range of motion in all 4 extremities. No joint swelling    Assessment/Plan: 1. Functional deficits which require 3+ hours per day of interdisciplinary therapy in a comprehensive inpatient rehab setting. Physiatrist is providing close team supervision and 24 hour management of active medical problems listed below. Physiatrist and rehab team continue to assess barriers to discharge/monitor patient progress toward functional and medical goals  Care Tool:  Bathing    Body parts bathed by patient: Right arm, Left arm, Chest, Abdomen, Right upper leg, Front perineal area, Left upper leg, Face   Body parts  bathed by helper: Buttocks, Right lower leg, Left lower leg     Bathing assist Assist Level: Moderate Assistance - Patient 50 - 74%     Upper Body Dressing/Undressing Upper body dressing   What is the patient wearing?: Pull over shirt    Upper body assist Assist Level: Supervision/Verbal cueing    Lower Body Dressing/Undressing Lower body dressing      What is the patient wearing?: Incontinence brief     Lower body assist Assist for lower body dressing: Moderate Assistance - Patient 50 - 74%     Toileting Toileting    Toileting assist Assist for toileting: Contact Guard/Touching assist     Transfers Chair/bed transfer  Transfers assist     Chair/bed transfer assist level: Moderate Assistance - Patient 50 - 74%     Locomotion Ambulation   Ambulation assist      Assist level: Contact Guard/Touching assist Assistive device: Walker-rolling Max distance: 50'   Walk  10 feet activity   Assist     Assist level: Contact Guard/Touching assist Assistive device: Walker-rolling   Walk 50 feet activity   Assist    Assist level: Contact Guard/Touching assist Assistive device: Walker-rolling    Walk 150 feet activity   Assist Walk 150 feet activity did not occur: Safety/medical concerns (Fatigue)         Walk 10 feet on uneven surface  activity   Assist Walk 10 feet on uneven surfaces activity did not occur: Safety/medical concerns (Fatigue)         Wheelchair     Assist Is the patient using a wheelchair?: Yes Type of Wheelchair: Manual    Wheelchair assist level: Dependent - Patient 0%      Wheelchair 50 feet with 2 turns activity    Assist        Assist Level: Dependent - Patient 0%   Wheelchair 150 feet activity     Assist      Assist Level: Dependent - Patient 0%   Blood pressure 139/69, pulse 78, temperature 98.3 F (36.8 C), resp. rate 19, height 5\' 1"  (1.549 m), weight 96.2 kg, SpO2 97 %.    Medical Problem List and Plan: 1. Functional deficits secondary to debility from bacterial and viral PNA community acquired             -patient may  shower             -ELOS/Goals: 1/16, mod I, 2.  Antithrombotics: -DVT/anticoagulation:  Pharmaceutical: Other (comment) Eliquis for Afib             -antiplatelet therapy: Plavix 3. Pain Management: OxyIR 10mg  QID, flexeril 10mg  TID prn spasms RLE pain - pt states this was severe but has improved today .  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 Mood: Ativan, 0.5mg  BID, Zoloft 100mg  qd             -antipsychotic agents: NA 5. Neuropsych: This patient is  capable of making decisions on her own behalf. 6. Skin/Wound Care: MASD , probable mild candida and shearing , barrier cream, nystatin topical , transfer training  7. Fluids/Electrolytes/Nutrition: Monitor I/Os  8.  COPD  prn alb inh, Brovana BID, Pulmicort nebs BID, Montelukast  9.  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  Hx ESRD, renal transplant 1991, Imuran, prednisone wean from 15mg  to 5mg  change Q3d  11.  HTN- metoprolol Vitals:   08/28/21 1959 08/29/21 0414  BP: (!) 144/69 139/69  Pulse: 81 78  Resp: 19 19  Temp: 98.3 F (36.8 C) 98.3 F (36.8 C)  SpO2: 100% 97%  Controlled 1/13  12.  HLD , PAD crestor, clopidigrel 13.  Constipation due to opioids and immobility- senna-S, Miralax, Linzess      LOS: 4 days A FACE TO FACE EVALUATION WAS PERFORMED  Charlett Blake 08/29/2021, 9:05 AM

## 2021-08-29 NOTE — Progress Notes (Shared)
Physical Therapy Discharge Summary  Patient Details  Name: Katherine [name] MRN: [id number] Date of Birth: 01-28-1956  {CHL IP REHAB PT TIME CALCULATION:304800500}   Patient has met {NUMBERS 0-12:18577} of 7 long term goals due to improved activity tolerance, improved balance, improved postural control, increased strength, and decreased pain.  Patient to discharge at an ambulatory level {LOA:3049010}.   Patient's care partner {care partner:[id number]} to provide the necessary {assistance:3041652} assistance at discharge.  Reasons goals not met: ***  Recommendation:  Patient will benefit from ongoing skilled PT services in home health setting to continue to advance safe functional mobility, address ongoing impairments in cardiovascular deconditioning, BLE weakness and minimize fall risk.  Equipment: {equipment:3041657}  Reasons for discharge: treatment goals met and discharge from hospital  Patient/family agrees with progress made and goals achieved: Yes  PT Discharge Precautions/Restrictions Precautions Precautions: Fall Precaution Comments: monitor sats/HR, post covid debility/deconditioned Restrictions Weight Bearing Restrictions: No Vital Signs   Pain   Pain Interference Pain Interference Pain Effect on Sleep: 4. Almost constantly Pain Interference with Therapy Activities: 4. Almost constantly Pain Interference with Day-to-Day Activities: 4. Almost constantly Vision/Perception  Vision - History Ability to See in Adequate Light: 0 Adequate Perception Perception: Within Functional Limits Praxis Praxis: Intact  Cognition Overall Cognitive Status: Within Functional Limits for tasks assessed Arousal/Alertness: Awake/alert Safety/Judgment: Appears intact Sensation Sensation Light Touch: Impaired by gross assessment Peripheral sensation comments: Reports numbness in RLE > LLE Light Touch Impaired Details: Impaired RLE Additional Comments: impaired in BLEs (reports  numbness in B feet) Coordination Gross Motor Movements are Fluid and Coordinated: Yes Coordination and Movement Description: limited by global deconditioning Finger Nose Finger Test: [facility] Heel Shin Test: NT Motor  Motor Motor: Within Functional Limits Motor - Discharge Observations: Global deconditioning  Mobility   Locomotion     Trunk/Postural Assessment  Cervical Assessment Cervical Assessment: Exceptions to [facility] (Forward head) Thoracic Assessment Thoracic Assessment: Exceptions to [facility] (Rounded shoulders) Lumbar Assessment Lumbar Assessment: Exceptions to [facility] (Posterior pelvic tilt) Postural Control Postural Control: Within Functional Limits  Balance   Extremity Assessment             Cruzita Lederer Alyza Artiaga, PT, DPT 08/29/2021, 10:49 AM

## 2021-08-29 NOTE — Progress Notes (Signed)
Physical Therapy Session Note  Patient Details  Name: Katherine [name] MRN: [id number] Date of Birth: 26-Jun-1956  Today's Date: 08/29/2021 PT Individual Time: 4166-0630 PT Individual Time Calculation (min): 84 min   Short Term Goals: Week 1:  PT Short Term Goal 1 (Week 1): STG = LTG due to LOS  Skilled Therapeutic Interventions/Progress Updates:  Pt received supine in bed, on 3L wall O2, handoff w/rec therapy. Pt seemingly in better spirits today and was agreeable to PT. Pt reported 2/10 pain in RLE and was premedicated. Emphasis of session on transfers, improved activity tolerance and stair training. Pt performed bed mobility mod I w/bedrail and sit <>stand w/distant S* to RW. Stand pivot from EOB to WC w/S* and RW. Pt transferred to 3L portable O2 and transported to main gym w/total A for time management. SpO2 at 96% while seated. Pt performed sit <>stands from Wc throughout session w/distant S*.   Stair training -Pt attempted to ascend single 6" step w/BUE support on L rail (as she does at home), leading w/LLE. Pt unable to clear step w/RLE due to "pain and weakness", requiring mod A to lower foot to ground safely and regain balance. Regressed to stair training w/3" steps and BUE support on L rail. Pt ascended/descended 5 3" steps 3 times w/L handrail, step-to pattern and CGA. Min verbal cues to remind pt to ascend w/LLE and descend w/RLE. SpO2 dropped to 82% after stairs but rose to 95% after 1 min. Pt required significant rest break in between trials, lengthy education regarding pursed lip breathing during activity for improved pacing and reduction of shortness of breath. Also educated pt on acknowledging fear-avoidance behavior and importance of approaching movement with a positive attitude, as pt is convinced she "is old and wrinkled and cannot do anything". Provided emotional support and encouragement, which pt verbalized appreciation for.   Pt transported back to room w/total A 2/2  fatigue and was left seated in WC, all needs in reach and transferred back to 3L wall O2. Provided yellow theraband for pt to use for BLE strengthening in between therapy sessions.   Therapy Documentation Precautions:  Precautions Precautions: Fall Precaution Comments: monitor sats/HR, post covid debility/deconditioned Restrictions Weight Bearing Restrictions: No    Therapy/Group: Individual Therapy Cruzita Lederer Whitt Auletta, PT, DPT  08/29/2021, 7:51 AM

## 2021-08-29 NOTE — Progress Notes (Signed)
Pt refused am CHG bath

## 2021-08-29 NOTE — Evaluation (Signed)
Recreational Therapy Assessment and Plan  Patient Details  Name: Katherine [name] MRN: [id number] Date of Birth: 12-20-1955 Today's Date: 08/29/2021  Rehab Potential:  Good ELOS:   D/C 09/01/21  Assessment   Hospital Problem: Principal Problem:   Debility Active Problems:   Pneumonia, viral   Post covid-19 condition, unspecified     Past Medical History:      Past Medical History:  Diagnosis Date   Anemia     Anxiety     Bilateral carotid artery stenosis      Carotid duplex 12/7844: 9-62% LICA, 95-28% RICA, >41% RECA, f/u 1 yr suggested   CAD (coronary artery disease) of bypass graft 5/01; 3/'02, 8/'03, 10/'04; 1/15    PCI x 5 to SVG-D1    CAD in native artery 07/1993    3 Vessel Disease (LAD-D1 & RCA) -- CABG (Dx in setting of inferior STEMI-PTCA of RCA)   CAD S/P percutaneous coronary angioplasty      PCI to SVG-D1 insertion/native D1 x 4 = '01 -(S660 BMS 2.5 x 9 anastomosis- D1); '02 - distal overlap ACS Pixel 2.5 x 8  BMS; '03 distal/native ISR/Thrombosis - Pixel 2.5 x 13; '04 - ISR-  Taxus 2.5 x 20 (covered all);; 1/15 - mid SVG-D1 (50% distal ISR) - Promus P 2.75 x 20 -- 2.8 mm; 6/22: Extensive ISR PTCA & Anastomotic-Native Diag DES PCI (Frontier Onyx 2.25x12 (2.75-2.5 mm post-dilation   COPD mixed type (HCC)      Followed by Dr. Lamonte Sakai "pulmonologist said no COPD"   Depression with anxiety     Diabetes mellitus type 2 in obese ([place])     Diarrhea      started after cholecystectomy and mass removed from intestine   Dyslipidemia, goal LDL below 70      08/2012: TC 137, TG 200, HDL 32!, LDL 45; on statin (followed by Dr.Deterding)   ESRD (end stage renal disease) ([place]) 1991    s/p [facility] - Dr. Jimmy Footman)    Family history of adverse reaction to anesthesia      mom's bp dropped during/after anesthesia   Fibromyalgia     GERD (gastroesophageal reflux disease)     Glomerulonephritis, chronic, rapidly progressive 1989   H/O ST elevation myocardial  infarction (STEMI) of inferoposterior wall 07/1993    Rescue PTCA of RCA -- referred for CABG.   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  Had having having lots of nausea and vomiting with poor p.o. intake.  Thought to have QT prolongation with multiple medications involved and hypomagnesemia, hypokalemia.  Tikosyn discontinued along with Zoloft and Phenergan.   Hypertension associated with diabetes ([place])     Mild aortic stenosis by prior echocardiogram 07/2019    Echo:  Mild aortic stenosis (gradients: Mean 14.3 mmHg -peak 24.9 mmHg).   Morbid obesity (HCC)     MRSA (methicillin resistant staph aureus) culture positive     OSA (obstructive sleep apnea)      no longer on CPAP or home O2, states she doesn't need now after lap band   PAD (peripheral artery disease) ([place])  08/2013    LEA Dopplers to be read by Dr. Fletcher Anon   PAF (paroxysmal atrial fibrillation) ([place]) 06/2014    Noted on CardioNet Monitor  - --> rhythm control with Tikosyn (Dr. Rayann Heman); converted from warfarin to apixaban for anticoagulation.   Pneumonia     Recurrent boils      Bilateral Groin   Rheumatoid arthritis ([place])      Per Patient Report; associated with OA   S/p cadaver renal transplant 1991    [facility]   Torsades de pointes 05/12/2021   Unstable angina ([place]) 5/01; 3/'02, 8/'03, 10/'04; 1/15    x 5 occurences since Inf-Post STEMI in 1994    Past Surgical History:       Past Surgical History:  Procedure Laterality Date   ABDOMINAL AORTOGRAM N/A 04/21/2018    Procedure: ABDOMINAL AORTOGRAM;  Surgeon: [name], MD;  Location: [place] [facility];  Service: Cardiovascular;  Laterality: N/A;   CATHETER REMOVAL       CHOLECYSTECTOMY N/A  10/29/2014    Procedure: LAPAROSCOPIC CHOLECYSTECTOMY WITH INTRAOPERATIVE CHOLANGIOGRAM;  Surgeon: Excell Seltzer, MD;  Location: [facility];  Service: General;  Laterality: N/A;   CORONARY ANGIOPLASTY   1994    x5   CORONARY ARTERY BYPASS GRAFT   1995    LIMA-LAD, SVG-RPDA, SVG-D1   CORONARY STENT INTERVENTION N/A 02/20/2021    Procedure: PERCUTANEOUS CORONARY STENT INTERVENTION;  Surgeon: [name], MD;  Location: [place] [facility]; ostLCx 60% (Neg RFR 0.96);; SVG- D2 recurrent 90% ISR & 95% native D2 after graft-> DES PCI of 95% anastomotic D2 lesion (Onyx Frontier 2.25 mm x 12 mm => 2.75 mm @ overlap, 2.5 distal.);PTCA of ISR in body of graft. ->  2.5 mm scoring balloon & post-dil w/ 2.75 mm McCracken balloon   ESOPHAGOGASTRODUODENOSCOPY N/A 10/15/2016    Procedure: ESOPHAGOGASTRODUODENOSCOPY (EGD);  Surgeon: Wilford Corner, MD;  [facility] ENDOSCOPY;  Service: Endoscopy;  Laterality: N/A;   I & D EXTREMITY Right 01/29/2018    Procedure: IRRIGATION AND DEBRIDEMENT THUMB;  Surgeon: Dayna Barker, MD;  Location: [place];  Service: Plastics;  Laterality: Right;   INCISE AND DRAIN ABCESS       INTRAVASCULAR PRESSURE WIRE/FFR STUDY N/A 02/20/2021    Procedure: INTRAVASCULAR PRESSURE WIRE/FFR STUDY;  Surgeon: [name], MD;  Location: [place] [facility] LAB;  Service: Cardiovascular;  Laterality: N/A;   KIDNEY TRANSPLANT   1991   KNEE ARTHROSCOPY WITH LATERAL MENISECTOMY Left 12/03/2017    Procedure: LEFT KNEE ARTHROSCOPY WITH LATERAL MENISECTOMY;  Surgeon: Earlie Server, MD;  Location: [place];  Service: Orthopedics;  Laterality: Left;   LAPAROSCOPIC GASTRIC BANDING   04/2004; 10/'09, 2/'10    Port Replacement x 2   LEFT HEART CATH AND CORONARY ANGIOGRAPHY N/A 02/20/2021    Procedure: LEFT HEART CATH AND CORONARY ANGIOGRAPHY;  Surgeon: [name], MD;  Location: [place] [facility];  Service: Cardiovascular;  Laterality: N/A;   LEFT HEART CATH AND CORS/GRAFTS ANGIOGRAPHY N/A 04/21/2018     Procedure: LEFT HEART CATH AND CORS/GRAFTS ANGIOGRAPHY;  Surgeon: [name], MD;  Location: [place] [facility];  Ost-Prox LAD 50% - proxLAD (pre & post D1) 100% CTO. Cx - patent, small OM1 (stable ~ ostial OM1 90%, too small for PCI) & 2 small LPL; Ost-distal RCA 100% CTO.  LIMA-LAD (not injected); SVG-dRCA patent, SVG-D1 - insertion stent ~20% ISR - Severe R CFA disease w/ focal Sub TO   LEFT HEART CATH AND  CORS/GRAFTS ANGIOGRAPHY   5/'01, 3/'02, 8/'03, 10/'04; 1/'15    08/22/2013: LAD & RCA 100%; LIMA-LAD & SVG-rPDA patent; Cx-- OM1 60%, OM2 ostial ~50%; SVG-D1 - 80% mid, 50% distal ISR --PCI   LEFT HEART CATH AND CORS/GRAFTS ANGIOGRAPHY N/A 01/31/2021    Procedure: LEFT HEART CATH AND CORS/GRAFTS ANGIOGRAPHY;  Surgeon: Jolaine Artist, MD;  Location: [facility];;   LEFT HEART CATH AND CORS/GRAFTS ANGIOGRAPHY N/A 08/21/2021    Procedure: LEFT HEART CATH AND CORS/GRAFTS ANGIOGRAPHY;  Surgeon: Belva Crome, MD;  Location: [place] [facility];  Service: Cardiovascular;  Laterality: N/A;   LEFT HEART CATHETERIZATION WITH CORONARY/GRAFT ANGIOGRAM N/A 08/23/2013    Procedure: LEFT HEART CATHETERIZATION WITH Beatrix Fetters;  Surgeon: Wellington Hampshire, MD;  Location: [place] CATH LAB;  Service: Cardiovascular;  Laterality: N/A;   Lower Extremity Arterial Dopplers   08/2013    ABI: R 0.96, L 1.04   MULTIPLE TOOTH EXTRACTIONS   age 66   NM MYOVIEW LTD   03/2016    EF 62%. LOW RISK. C/W prior MI - no Ischemia. Apical hypokinesis.   PERCUTANEOUS CORONARY STENT INTERVENTION (PCI-S)   5/'01, 3/'02, 8/'03, 10/'04;    '01 - S660 BMS 2.5 x 9 - dSVG-D1 into D1; '02- post-stent stenosis - 2.5 x 8 Pixel BMS; '8\03: ISR/Thrombosis into native D1 - AngioJet, 2.5 x 13 Pixel; '04 - ISR 95% - covered stented area with Taxus DES 2.5 mm x 20 (2.88)   PERCUTANEOUS CORONARY STENT INTERVENTION (PCI-S)   08/23/2013    Procedure: PERCUTANEOUS CORONARY STENT INTERVENTION (PCI-S);  Surgeon: Wellington Hampshire, MD;  Location: [facility] CATH LAB;  Service: Cardiovascular;;mid SVG-D1 80%; distal stent ~50% ISR; Promus [place] DES 2.75 mm xc 20 mm (2.8 mm)   PORT-A-CATH REMOVAL        kidney   TRANSTHORACIC ECHOCARDIOGRAM   07/2019    a) 07/2019: EF 55 to 60%.  No LVH.  Paradoxical septal WM-s/p CABG.  GRII DD.  Nl RV size and fxn.  Mild bilateral atrial dilation.  Mod MAC.  Trace MR.  Mild AS (gradients: Mean 14.3 mmHg -peak 24.9 mmHg).; B) 06/2020: EF 40 to 45%.  Moderate concentric LVH.  GRII DD.  Elevated LAP.  Mod HK mid Apical Ant-AntSept wall & mild Apical Dyskinesis.  Mod LA dilation.  Mild MR.  AoV sclerosis w/o AS.   TRANSTHORACIC ECHOCARDIOGRAM   01/30/2021    EF 55 to 60%.  Mild LVH.  GR 1 DD.  Elevated LAP.  Moderate LA dilation.  Mild MR with mild MS.  Mild aortic valve stenosis.:   TUBAL LIGATION       wrist fistula repair Left      dialysis for one year      Assessment & Plan Clinical Impression: Natahlia L. Gipe is a 66 year old female with history of CAD s/p CABG/PCI- last 07/22, PAD, PAF/torsades Hx- on eliquis, chronic diastolic CHF, OSA--no  CPAP, T2DM with neuropathy, renal transplant, RA/FM w/chronic pain, COPD-3 L oxygen dependent who was admitted to [facility] on 07/25/21 with mild confusion, SOB with hypoxia despite increase in oxygen needs post exposure to flu, low grade fever/chills, diarrhea, pleuritic chest pain and generalized weakness.   She was found to have acute on chronic respiratory failure due to Covid PNA and treated with steriods, Remdisvir and Ceftriaxone. Hospital course significant for weakness with ongoing hypoxia requiring high flow oxygen as well as poorly controlled BS. On 12/24, she developed significant CP  despite NTG and morphine. EKG showed diffuse ST and T wave abnormalities,  CXR showed acute on chronic fluid CHF as well as elevation in troponin's due to NSTEMI. She was transitioned to IV heparin, treated diuresis but continued to have rise in troponin with increase  in oxygen needs.    Dr. Justine Null was consulted to assist with management of anxiety/depression and she was started on ativan bid in addition to increase in Zolfot to 250 mg/daily without SE.    She was taken to cath lab on 01/05 but found to have stenosis obstructing procedure and procedure aborted. Medical treatment recommended per Dr. Ronney Asters.   Patient transferred to CIR on 08/25/2021 .     Pt presents with decreased activity tolerance, decreased functional mobility, decreased balance, feelings of stress/anxiety Limiting pt's independence with leisure/community pursuits.  Met with pt today to discuss leisure interests, activity analysis/modifications and stress management/coping strategies.  Pt reports multiple stressors related to complex health history. Pt also shares that her family and her dog are a part of her success in coping with life stressors.  Pt is anxious to return her to her family but specifically her dog.    Plan  No further TR as pt is expected to discharge 1/16.  Recommendations for other services: None   Discharge Criteria: Patient will be discharged from TR if patient refuses treatment 3 consecutive times without medical reason.  If treatment goals not met, if there is a change in medical status, if patient makes no progress towards goals or if patient is discharged from hospital.  The above assessment, treatment plan, treatment alternatives and goals were discussed and mutually agreed upon: by patient  [place] 08/29/2021, 12:53 PM

## 2021-08-29 NOTE — Progress Notes (Signed)
Occupational Therapy Session Note  Patient Details  Name: Katherine [name] MRN: [id number] Date of Birth: 06-Jul-1956  Today's Date: 08/29/2021 OT Individual Time: 9678-9381 OT Individual Time Calculation (min): 41 min    Short Term Goals: Week 1:  OT Short Term Goal 1 (Week 1): STGs = LTGs 2/2 LOS  Skilled Therapeutic Interventions/Progress Updates:  Skilled OT intervention completed with focus on DME education, and toilet transfers. Pt received seated in w/c, emotional about d/c date changing, and pt missing her dog. Therapist providing emotional support and encouragement with pt agreeable to therapist assisting her with toileting needs. Pt completed sit > stand and stand pivot using RW with supervision assist, to [facility] in room. Pt with continent void episode, requiring increased time to void due to emotional state and perseveration on negative thoughts. Pt reporting need for muscle relaxant and suppository, with RN called in room and administered all meds. Therapist assisting with pt balance with CGA while using RW during suppository placement, with therapist donning green (XXL) brief on pt as pt with high complaint of brief falling down with tying of sides method vs fastening to assist in keeping them up. Discussed DME available at home with pt, with pt stating she has TTB that she puts in her garden tub at home currently, but is planning to live with brother who has tub/shower combo. Educated pt that TTB might be beneficial for shower transfer in shower she'll be using with pt expressing that it would calm her fears with falling with sit pivot method. Pt would benefit from practice with this method if agreeable prior to d/c. Pt left upright in bed, with bed alarm on and all needs in reach at end of session.   Therapy Documentation Precautions:  Precautions Precautions: Fall Precaution Comments: monitor sats/HR, post covid debility/deconditioned Restrictions Weight Bearing Restrictions:  No  Pain: Unrated positional pain in R leg, positioned with pillows at end of session  Therapy/Group: Individual Therapy  Katherine [name] 08/29/2021, 7:55 AM

## 2021-08-30 ENCOUNTER — Inpatient Hospital Stay (HOSPITAL_COMMUNITY): Payer: Medicare Other

## 2021-08-30 DIAGNOSIS — I739 Peripheral vascular disease, unspecified: Secondary | ICD-10-CM

## 2021-08-30 DIAGNOSIS — I70223 Atherosclerosis of native arteries of extremities with rest pain, bilateral legs: Secondary | ICD-10-CM

## 2021-08-30 DIAGNOSIS — R5381 Other malaise: Secondary | ICD-10-CM | POA: Diagnosis not present

## 2021-08-30 LAB — GLUCOSE, CAPILLARY
Glucose-Capillary: 254 mg/dL — ABNORMAL HIGH (ref 70–99)
Glucose-Capillary: 151 mg/dL — ABNORMAL HIGH (ref 70–99)
Glucose-Capillary: 268 mg/dL — ABNORMAL HIGH (ref 70–99)
Glucose-Capillary: 221 mg/dL — ABNORMAL HIGH (ref 70–99)

## 2021-08-30 NOTE — Progress Notes (Signed)
Chaplain responding to page for pt [facility]. RN shared that pt was "tearful" and wanted "to talk to someone". RN also shared that pt learned that her hospitalization may be lasting longer than originally anticipated and that she feels "like she's going backwards rather than getting better." Upon arrival, pt was "almost asleep" and requested a chaplain return tomorrow.  Chaplain services remain available for follow-up spiritual and emotional support as needed.  [place], [place]    08/30/21 0000  Clinical Encounter Type  Visited With Patient not available  Visit Type Initial  Referral From Nurse  Consult/Referral To Chaplain

## 2021-08-30 NOTE — Plan of Care (Signed)
°  Problem: Consults Goal: RH GENERAL PATIENT EDUCATION Description: See Patient Education module for education specifics. Outcome: Progressing   Problem: RH BOWEL ELIMINATION Goal: RH STG MANAGE BOWEL WITH ASSISTANCE Description: STG Manage Bowel with mod I Assistance. Outcome: Progressing Goal: RH STG MANAGE BOWEL W/MEDICATION W/ASSISTANCE Description: STG Manage Bowel with Medication with  mod I Assistance. Outcome: Progressing   Problem: RH BLADDER ELIMINATION Goal: RH STG MANAGE BLADDER WITH ASSISTANCE Description: STG Manage Bladder Without Assistance Outcome: Progressing   Problem: RH SKIN INTEGRITY Goal: RH STG SKIN FREE OF INFECTION/BREAKDOWN Description: Maintain skin with min assist Outcome: Progressing Goal: RH STG MAINTAIN SKIN INTEGRITY WITH ASSISTANCE Description: STG Maintain Skin Integrity With min Assistance. Outcome: Progressing Goal: RH STG ABLE TO PERFORM INCISION/WOUND CARE W/ASSISTANCE Description: STG Able To Perform Incision/Wound Care With  min  Assistance. Outcome: Progressing   Problem: RH SAFETY Goal: RH STG ADHERE TO SAFETY PRECAUTIONS W/ASSISTANCE/DEVICE Description: STG Adhere to Safety Precautions With cues Assistance/Device. Outcome: Progressing   Problem: RH PAIN MANAGEMENT Goal: RH STG PAIN MANAGED AT OR BELOW PT'S PAIN GOAL Description: At or below level 4 with prn Outcome: Progressing   Problem: RH KNOWLEDGE DEFICIT GENERAL Goal: RH STG INCREASE KNOWLEDGE OF SELF CARE AFTER HOSPITALIZATION Description: Patient will be able to manage care using handouts and educational resources independently Outcome: Progressing

## 2021-08-30 NOTE — Progress Notes (Signed)
PROGRESS NOTE   Subjective/Complaints: No further CP Also c/o LE pain RIght side, no falls or new trauma , no numbness or tingling, had similar less intense symptoms occasionally at home  LE doppler neg   Reviewed MRI of lumbar spine, no compressive lesions to explain RLE discomfort   ROS- neg CP, SOB, N/V/D  Objective:   DG Chest 2 View  Result Date: 08/29/2021 CLINICAL DATA:  Shortness of breath EXAM: CHEST - 2 VIEW COMPARISON:  08/14/2021 FINDINGS: Cardiac shadow remains enlarged. Postsurgical changes are noted. Increased vascular congestion and mild pulmonary edema right greater than left is noted. Left-sided PICC line is again identified likely in the left SVC. No pneumothorax or sizable effusion is seen. No bony abnormality is noted. IMPRESSION: CHF with parenchymal edema worse on the right than the left. These results will be called to the ordering clinician or representative by the Radiologist Assistant, and communication documented in the PACS or [employer]. Electronically Signed   By: Inez Catalina M.D.   On: 08/29/2021 22:11   MR LUMBAR SPINE WO CONTRAST  Result Date: 08/29/2021 CLINICAL DATA:  Initial evaluation for low back pain with right lower extremity pain. Immunocompromised. EXAM: MRI LUMBAR SPINE WITHOUT CONTRAST TECHNIQUE: Multiplanar, multisequence MR imaging of the lumbar spine was performed. No intravenous contrast was administered. COMPARISON:  Prior MRI from 08/25/2017. FINDINGS: Segmentation: Standard. Lowest well-formed disc space labeled the L5-S1 level. Alignment: 4 mm anterolisthesis of L4 on L5, chronic and facet mediated, and mildly progressed from prior. Trace retrolisthesis of T11 on T12, stable. Alignment otherwise normal with preservation of the normal lumbar lordosis. Vertebrae: Vertebral body height maintained without acute or chronic fracture. Bone marrow signal intensity within normal limits. No  discrete or worrisome osseous lesions or abnormal marrow edema. Conus medullaris and cauda equina: Conus extends to the L1-2 level. Conus and cauda equina appear normal. Paraspinal and other soft tissues: Paraspinous soft tissues demonstrate no acute finding. The visualized native kidneys are markedly atrophic. Multifocal atheromatous irregularity seen throughout the intra-abdominal aorta without visible aneurysm. Disc levels: T10-11: Seen only on sagittal projection. Intervertebral disc space narrowing with disc desiccation and mild disc bulge. Bilateral facet hypertrophy. No significant spinal stenosis. Foramina appear grossly patent. T11-12: Intervertebral disc space narrowing with mild disc bulge and disc desiccation. No significant stenosis. T12-L1: Unremarkable. L1-2: Disc desiccation without significant disc bulge. No canal or foraminal stenosis. L2-3: Disc desiccation without significant disc bulge. No canal or foraminal stenosis. L3-4: Mild intervertebral disc space narrowing with diffuse disc bulge. Superimposed broad-based left foraminal to extraforaminal disc protrusion contacts the exiting left L3 nerve root (series 6, image 24). Associated annular fissure. Mild bilateral facet hypertrophy. No significant spinal stenosis. Mild left L3 foraminal narrowing. Right neural foramina remains patent. L4-5: 4 mm anterolisthesis. Mild disc bulge with disc desiccation. Superimposed left foraminal to extraforaminal disc protrusion contacts the exiting left L4 nerve root (series 3, image 12). Advanced bilateral facet arthrosis, progressed from previous. No significant canal or lateral recess stenosis. Mild left greater than right L4 foraminal narrowing. L5-S1: Broad-based left eccentric disc bulge closely approximates and/or contacts the exiting left L5 nerve root (series 6, image 35). Moderate bilateral  facet arthrosis. No significant spinal stenosis. Mild left L5 foraminal narrowing without impingement. Right  foramen remains patent. IMPRESSION: 1. Broad-based left foraminal to extraforaminal disc protrusions at L3-4 and L4-5, contacting and potentially irritating the exiting left L3 and L4 nerve roots respectively. 2. Broad-based left eccentric disc bulge at L5-S1, closely approximating and/or potentially irritating the exiting left L5 nerve root. 3. Advanced bilateral facet arthrosis at L4-5 with associated 4 mm anterolisthesis, mildly progressed as compared to 2019. 4. No other acute abnormality within the lumbar spine. No findings to explain patient's right lower extremity symptoms identified. Aortic Atherosclerosis (ICD10-I70.0). Electronically Signed   By: Jeannine Boga M.D.   On: 08/29/2021 03:39   No results for input(s): WBC, HGB, HCT, PLT in the last 72 hours.  No results for input(s): NA, K, CL, CO2, GLUCOSE, BUN, CREATININE, CALCIUM in the last 72 hours.   Intake/Output Summary (Last 24 hours) at 08/30/2021 1155 Last data filed at 08/30/2021 0827 Gross per 24 hour  Intake 1550 ml  Output 400 ml  Net 1150 ml         Physical Exam: Vital Signs Blood pressure (!) 154/68, pulse 82, temperature 98.4 F (36.9 C), temperature source Oral, resp. rate 18, height 5\' 1"  (1.549 m), weight 93.1 kg, SpO2 96 %.   General: No acute distress Mood and affect are appropriate Heart: Regular rate and rhythm no rubs murmurs or extra sounds Lungs: Clear to auscultation, breathing unlabored, no rales or wheezes Abdomen: Positive bowel sounds, soft nontender to palpation, nondistended Extremities: No clubbing, cyanosis, or edema Skin: No evidence of breakdown, no evidence of rash  Neurologic: Cranial nerves II through XII intact, motor strength is 4/5 in bilateral deltoid, bicep, tricep, grip, hip flexor, knee extensors, ankle dorsiflexor and plantar flexor   Musculoskeletal: Full range of motion in all 4 extremities. No joint swelling    Assessment/Plan: 1. Functional deficits which require  3+ hours per day of interdisciplinary therapy in a comprehensive inpatient rehab setting. Physiatrist is providing close team supervision and 24 hour management of active medical problems listed below. Physiatrist and rehab team continue to assess barriers to discharge/monitor patient progress toward functional and medical goals  Care Tool:  Bathing    Body parts bathed by patient: Right arm, Left arm, Chest, Abdomen, Right upper leg, Front perineal area, Left upper leg, Face   Body parts bathed by helper: Buttocks, Right lower leg, Left lower leg, Right arm, Left arm, Chest, Abdomen, Front perineal area, Right upper leg, Left upper leg, Face     Bathing assist Assist Level: Moderate Assistance - Patient 50 - 74%     Upper Body Dressing/Undressing Upper body dressing   What is the patient wearing?: Hospital gown only    Upper body assist Assist Level: Minimal Assistance - Patient > 75%    Lower Body Dressing/Undressing Lower body dressing      What is the patient wearing?: Incontinence brief     Lower body assist Assist for lower body dressing: Moderate Assistance - Patient 50 - 74%     Toileting Toileting    Toileting assist Assist for toileting: Contact Guard/Touching assist     Transfers Chair/bed transfer  Transfers assist     Chair/bed transfer assist level: Supervision/Verbal cueing     Locomotion Ambulation   Ambulation assist      Assist level: Contact Guard/Touching assist Assistive device: Walker-rolling Max distance: 50'   Walk 10 feet activity   Assist     Assist  level: Contact Guard/Touching assist Assistive device: Walker-rolling   Walk 50 feet activity   Assist    Assist level: Contact Guard/Touching assist Assistive device: Walker-rolling    Walk 150 feet activity   Assist Walk 150 feet activity did not occur: Safety/medical concerns (Fatigue)         Walk 10 feet on uneven surface  activity   Assist Walk 10  feet on uneven surfaces activity did not occur: Safety/medical concerns (Fatigue)         Wheelchair     Assist Is the patient using a wheelchair?: Yes Type of Wheelchair: Manual    Wheelchair assist level: Dependent - Patient 0%      Wheelchair 50 feet with 2 turns activity    Assist        Assist Level: Dependent - Patient 0%   Wheelchair 150 feet activity     Assist      Assist Level: Dependent - Patient 0%   Blood pressure (!) 154/68, pulse 82, temperature 98.4 F (36.9 C), temperature source Oral, resp. rate 18, height 5\' 1"  (1.549 m), weight 93.1 kg, SpO2 96 %.    Medical Problem List and Plan: 1. Functional deficits secondary to debility from bacterial and viral PNA community acquired             -patient may  shower             -ELOS/Goals: 1/16--> 1/18, mod I, extended, discussed importance of going to therapy even if she does not feel 100%  2.  Antithrombotics: -DVT/anticoagulation:  Pharmaceutical: Other (comment) Eliquis for Afib             -antiplatelet therapy: Plavix 3. Pain Management: OxyIR 10mg  QID, flexeril 10mg  TID prn spasms RLE pain - pt states this was severe in NAD during my visit.  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 Mood: Ativan, 0.5mg  BID, Zoloft 100mg  qd             -antipsychotic agents: NA 5. Neuropsych: This patient is capable of making decisions on her own behalf. 6. Skin/Wound Care: MASD , probable mild candida and shearing , barrier cream, nystatin topical , transfer training  7. Fluids/Electrolytes/Nutrition: Monitor I/Os  8.  COPD prn alb inh, Brovana BID, Pulmicort nebs BID, Montelukast  9.  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  Hx ESRD, renal transplant 1991, Imuran, prednisone wean from 15mg  to 5mg  change Q3d  11.  HTN- metoprolol Vitals:   08/30/21 0321 08/30/21 0755  BP: (!) 154/68   Pulse: 76 82  Resp: 18 18  Temp: 98.4 F (36.9 C)   SpO2: 96% 96%  Controlled 1/13  12.  HLD , PAD crestor, clopidigrel 13.  Constipation due to opioids and immobility- senna-S, Miralax, Linzess  14.  RIght lung pulm  edema , per pt takes diuretics intermittently at home, breathing is better today     LOS: 5 days A FACE TO FACE EVALUATION WAS PERFORMED  Charlett Blake 08/30/2021, 11:55 AM

## 2021-08-30 NOTE — Progress Notes (Signed)
Occupational Therapy Session Note  Patient Details  Name: Katherine [name] MRN: [id number] Date of Birth: April 27, 1956  Today's Date: 08/31/2021 OT Individual Time: 1421-1536 OT Individual Time Calculation (min): 75 min   Short Term Goals: Week 1:  OT Short Term Goal 1 (Week 1): STGs = LTGs 2/2 LOS  Skilled Therapeutic Interventions/Progress Updates:    Pt greeted in the recliner, requesting to use the restroom. Ambulatory transfer to toilet completed using RW with supervision assistance. Pt on 3L 02 via portable tank. Pt able to complete 3/3 components of toileting with supervision assist, pt having +bladder void. After handwashing at the sink, pt returned to the recliner. Discussed importance of diaphragmatic breathing for respiratory musculature, guided her through diaphragmatic breathing exercises 10 reps 2 sets. Afterwards OT provided instruction during UB therapeutic exercises using 2# bar, encouraged using breathing technique that we reviewed. Learned that pt loves to sing and dance. Played meaningful music and pt engaging in exercises in beat to music, taking rest breaks as needed. 02 sats during activity 88-93%, when sats were below 90 we took rest breaks to focus on breathing until oxygen levels were stable. Pts affect bright and cheerful. At end of session pt was left in care of NT for returning to bed. Tx focus placed on functional transfers, ADL retraining, and UB strengthening/activity tolerance.   Entirety of session took place inside of pts room and pt only ambulated household distances during tx per MD restrictions  Therapy Documentation Precautions:  Precautions Precautions: Fall Precaution Comments: monitor sats/HR, post covid debility/deconditioned Restrictions Weight Bearing Restrictions: No Vital Signs: Therapy Vitals Temp: 98.3 F (36.8 C) Temp Source: Oral Pulse Rate: 76 Resp: 17 BP: 131/70 Patient Position (if appropriate): Sitting Oxygen Therapy SpO2: 100  % O2 Device: Nasal Cannula Pain: pt reported that ice pack made Rt LE pain manageable during tx   ADL: ADL Eating: Set up Grooming: Setup Upper Body Bathing: Supervision/safety Lower Body Bathing: Maximal assistance Upper Body Dressing: Minimal assistance Lower Body Dressing: Moderate assistance Toileting: Moderate assistance Where Assessed-Toileting: Bedside Commode Toilet Transfer: Contact guard Toilet Transfer Method: Stand pivot Therapy/Group: Individual Therapy  Gerica Koble A Nelly Scriven 08/31/2021, 4:17 PM

## 2021-08-30 NOTE — Progress Notes (Signed)
Spoke to vascular ultrasonographer who completed bilateral ABIs and arterial duplex, significant PAD R>L. Significant worsening since last study in 2015. Will consult VVS.  Would monitor for symptoms during therapy and overall limit ambulatory distance to functional  Household distances

## 2021-08-31 DIAGNOSIS — R5381 Other malaise: Secondary | ICD-10-CM | POA: Diagnosis not present

## 2021-08-31 LAB — GLUCOSE, CAPILLARY
Glucose-Capillary: 289 mg/dL — ABNORMAL HIGH (ref 70–99)
Glucose-Capillary: 291 mg/dL — ABNORMAL HIGH (ref 70–99)
Glucose-Capillary: 187 mg/dL — ABNORMAL HIGH (ref 70–99)
Glucose-Capillary: 162 mg/dL — ABNORMAL HIGH (ref 70–99)

## 2021-08-31 MED ORDER — INSULIN GLARGINE-YFGN 100 UNIT/ML ~~LOC~~ SOLN
20.0000 [IU] | Freq: Every day | SUBCUTANEOUS | Status: DC
  Administered 2021-08-31 – 2021-09-02 (×3): 20 [IU] via SUBCUTANEOUS
  Filled 2021-08-31 (×3): qty 0.2
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
## 2021-08-31 NOTE — Progress Notes (Signed)
PROGRESS NOTE   Subjective/Complaints: No further CP RLE pain at rest , discussed arterial doppler results and need fo rvascular surgery consult, pt requests that her cardiologists be notified   Reviewed MRI of lumbar spine, no compressive lesions to explain RLE discomfort   ROS- neg CP, SOB, N/V/D  Objective:   DG Chest 2 View  Result Date: 08/29/2021 CLINICAL DATA:  Shortness of breath EXAM: CHEST - 2 VIEW COMPARISON:  08/14/2021 FINDINGS: Cardiac shadow remains enlarged. Postsurgical changes are noted. Increased vascular congestion and mild pulmonary edema right greater than left is noted. Left-sided PICC line is again identified likely in the left SVC. No pneumothorax or sizable effusion is seen. No bony abnormality is noted. IMPRESSION: CHF with parenchymal edema worse on the right than the left. These results will be called to the ordering clinician or representative by the Radiologist Assistant, and communication documented in the PACS or [employer]. Electronically Signed   By: Inez Catalina M.D.   On: 08/29/2021 22:11   VAS [place] ABI WITH/WO TBI  Result Date: 08/30/2021  LOWER EXTREMITY DOPPLER STUDY Patient Name:  Katherine [name]  Date of Exam:   08/30/2021 Medical Rec #: [id number]           Accession #:    [id number] Date of Birth: 02/04/1956           Patient Gender: F Patient Age:   66 years Exam Location:  [facility] Procedure:      VAS [place] ABI WITH/WO TBI Referring Phys: Alysia Penna --------------------------------------------------------------------------------  Indications: Rest pain, specifically around the shin area. Pperipheral artery              disease, (cath attempted 08/18/2021- Bilateral iliofemoral              obstruction) preventing catheterization. 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 High  Risk         Hypertension, hyperlipidemia, Diabetes, coronary artery Factors:          disease. Other Factors: Recent Covid pneumonia, NSTEMI, history of renal transplant. CABG                1994.  Vascular Interventions: No prior. Comparison Study: Prior LEA done 08/28/13 indicating possible inflow disease on                   the right suggested by common femoral waveforms and severe                   bilateral common femoral artery stenosis. Performing Technologist: Sharion Dove RVS  Examination Guidelines: A complete evaluation includes at minimum, Doppler waveform signals and systolic blood pressure reading at the level of bilateral brachial, anterior tibial, and posterior tibial arteries, when vessel segments are accessible. Bilateral testing is considered an integral part of a complete examination. Photoelectric Plethysmograph (PPG) waveforms and toe systolic pressure readings are included as required and additional duplex testing as needed.  Limited examinations for reoccurring indications may be performed as noted.  ABI Findings: +---------+------------------+-----+-------------------+--------+  Right     Rt Pressure (mmHg) Index Waveform            Comment   +---------+------------------+-----+-------------------+--------+  Brachial  115                      multiphasic                   +---------+------------------+-----+-------------------+--------+  PTA       87                 0.76  dampened monophasic           +---------+------------------+-----+-------------------+--------+  DP        22                 0.19  dampened monophasic           +---------+------------------+-----+-------------------+--------+  Great Toe 31                 0.27                                +---------+------------------+-----+-------------------+--------+ +---------+------------------+-----+----------+--------------------+  Left      Lt Pressure (mmHg) Index Waveform   Comment                +---------+------------------+-----+----------+--------------------+  Brachial                                      restricted (fistula)  +---------+------------------+-----+----------+--------------------+  PTA       254                2.21  monophasic                       +---------+------------------+-----+----------+--------------------+  DP        254                2.21  monophasic                       +---------+------------------+-----+----------+--------------------+  Great Toe 73                 0.63                                   +---------+------------------+-----+----------+--------------------+ +-------+----------------+-----------+------------+------------+  ABI/TBI Today's ABI      Today's TBI Previous ABI Previous TBI  +-------+----------------+-----------+------------+------------+  Right   0.76             0.27        0.96         0.70          +-------+----------------+-----------+------------+------------+  Left    non compressible 0.63        1.04         0.71          +-------+----------------+-----------+------------+------------+ Right ABIs appear decreased compared to prior study on 08/28/13. Bilateral TBIs appear decreased compared to prior study on 08/28/13. Left ABI is now non compressible since study done 08/28/13  Summary: Right: Resting right ankle-brachial index indicates moderate right lower extremity arterial disease. The right toe-brachial index is abnormal. ABI  indicates a moderate decrease in flow by velocity, however, significantly dampened waveforms indicates a more severe level of disease. Left: Resting left ankle-brachial index indicates noncompressible left lower extremity arteries. The left toe-brachial index is abnormal.  *See table(s) above for measurements and observations.  Electronically signed by Jamelle Haring on 08/30/2021 at 3:15:06 PM.    Final    VAS [place] LOWER EXTREMITY ARTERIAL DUPLEX  Result Date: 08/30/2021 LOWER EXTREMITY ARTERIAL DUPLEX STUDY Patient  Name:  Katherine [name]  Date of Exam:   08/30/2021 Medical Rec #: [id number]           Accession #:    [id number] Date of Birth: [birth year]           Patient Gender: F Patient Age:   66 years Exam Location:  [facility] Procedure:      VAS [place] LOWER EXTREMITY ARTERIAL DUPLEX Referring Phys: Alysia Penna --------------------------------------------------------------------------------  Indications: Rest pain,specifically at shin area. 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 High Risk Factors: Hypertension, hyperlipidemia, Diabetes, prior MI, coronary                    artery disease. Other Factors: Kidney transplant, CABG, recent Covid pneumonia.  Vascular Interventions: No prior intervention. Current ABI:            R:0.76 L:non compressible Comparison Study: Prior LEA done 08/28/13 indicating possible inflow disease on                   the right suggested by right CFA waveforms. Severe bilateral                   CFA stenosis. Performing Technologist: Sharion Dove RVS  Examination Guidelines: A complete evaluation includes B-mode imaging, spectral Doppler, color Doppler, and power Doppler as needed of all accessible portions of each vessel. Bilateral testing is considered an integral part of a complete examination. Limited examinations for reoccurring indications may be performed as noted.  +-----------+--------+-----+--------+----------+---------------------+  RIGHT       PSV cm/s Ratio Stenosis Waveform   Comments               +-----------+--------+-----+--------+----------+---------------------+  CFA Prox    52                      monophasic                        +-----------+--------+-----+--------+----------+---------------------+  DFA         22                      monophasic                         +-----------+--------+-----+--------+----------+---------------------+  SFA Prox    43                      monophasic collateral flow noted  +-----------+--------+-----+--------+----------+---------------------+  SFA Mid     35  monophasic                        +-----------+--------+-----+--------+----------+---------------------+  SFA Distal  32                      monophasic                        +-----------+--------+-----+--------+----------+---------------------+  POP Prox    22                      monophasic                        +-----------+--------+-----+--------+----------+---------------------+  POP Distal  32                      monophasic                        +-----------+--------+-----+--------+----------+---------------------+  ATA Prox    21                      monophasic                        +-----------+--------+-----+--------+----------+---------------------+  ATA Mid     25                      monophasic                        +-----------+--------+-----+--------+----------+---------------------+  ATA Distal  30                      monophasic                        +-----------+--------+-----+--------+----------+---------------------+  PTA Prox    39                      monophasic                        +-----------+--------+-----+--------+----------+---------------------+  PTA Mid     39                      monophasic                        +-----------+--------+-----+--------+----------+---------------------+  PTA Distal  53                      monophasic                        +-----------+--------+-----+--------+----------+---------------------+  PERO Prox                           absent                            +-----------+--------+-----+--------+----------+---------------------+  PERO Mid                            absent                            +-----------+--------+-----+--------+----------+---------------------+  PERO Distal                          absent                            +-----------+--------+-----+--------+----------+---------------------+   +----------+--------+-----+--------+----------+--------+  LEFT       PSV cm/s Ratio Stenosis Waveform   Comments  +----------+--------+-----+--------+----------+--------+  CFA Prox   52                      monophasic           +----------+--------+-----+--------+----------+--------+  DFA        45                      monophasic           +----------+--------+-----+--------+----------+--------+  SFA Prox   57                      monophasic           +----------+--------+-----+--------+----------+--------+  SFA Mid    46                      monophasic           +----------+--------+-----+--------+----------+--------+  SFA Distal 32                      monophasic           +----------+--------+-----+--------+----------+--------+  POP Prox   31                      monophasic           +----------+--------+-----+--------+----------+--------+  POP Distal 24                      monophasic           +----------+--------+-----+--------+----------+--------+  ATA Prox   21                      monophasic           +----------+--------+-----+--------+----------+--------+  ATA Mid    25                      monophasic           +----------+--------+-----+--------+----------+--------+  ATA Distal 14                      monophasic           +----------+--------+-----+--------+----------+--------+  PTA Prox   30                      monophasic           +----------+--------+-----+--------+----------+--------+  PTA Mid    28                      monophasic           +----------+--------+-----+--------+----------+--------+  PTA Distal 25                      monophasic           +----------+--------+-----+--------+----------+--------+  Summary: Right: Atherosclerotic plaque noted throughout. Monophasic waveforms suggestive of iliofemoral disease. Some collateral flow noted  at the mid SFA. Left: Atherosclerotic plaque  noted throughout. Monophasic waveforms suggestive of iliofemoral disease.  See table(s) above for measurements and observations. Electronically signed by Jamelle Haring on 08/30/2021 at 3:16:10 PM.    Final    No results for input(s): WBC, HGB, HCT, PLT in the last 72 hours.  No results for input(s): NA, K, CL, CO2, GLUCOSE, BUN, CREATININE, CALCIUM in the last 72 hours.   Intake/Output Summary (Last 24 hours) at 08/31/2021 0737 Last data filed at 08/30/2021 2000 Gross per 24 hour  Intake 1560 ml  Output --  Net 1560 ml         Physical Exam: Vital Signs Blood pressure 139/62, pulse 77, temperature 98.6 F (37 C), temperature source Oral, resp. rate 16, height 5\' 1"  (1.549 m), weight 92.3 kg, SpO2 97 %.   General: No acute distress Mood and affect are appropriate Heart: Regular rate and rhythm no rubs murmurs or extra sounds Lungs: Clear to auscultation, breathing unlabored, no rales or wheezes Abdomen: Positive bowel sounds, soft nontender to palpation, nondistended Extremities: No clubbing, cyanosis, or edema Skin: No evidence of breakdown, no evidence of rash  Neurologic: Cranial nerves II through XII intact, motor strength is 4/5 in bilateral deltoid, bicep, tricep, grip, hip flexor, knee extensors, ankle dorsiflexor and plantar flexor   Musculoskeletal: Full range of motion in all 4 extremities. No joint swelling    Assessment/Plan: 1. Functional deficits which require 3+ hours per day of interdisciplinary therapy in a comprehensive inpatient rehab setting. Physiatrist is providing close team supervision and 24 hour management of active medical problems listed below. Physiatrist and rehab team continue to assess barriers to discharge/monitor patient progress toward functional and medical goals  Care Tool:  Bathing    Body parts bathed by patient: Right arm, Left arm, Chest, Abdomen, Right upper leg, Front perineal area, Left upper leg, Face   Body parts bathed by  helper: Buttocks, Right lower leg, Left lower leg, Right arm, Left arm, Chest, Abdomen, Front perineal area, Right upper leg, Left upper leg, Face     Bathing assist Assist Level: Moderate Assistance - Patient 50 - 74%     Upper Body Dressing/Undressing Upper body dressing   What is the patient wearing?: Hospital gown only    Upper body assist Assist Level: Minimal Assistance - Patient > 75%    Lower Body Dressing/Undressing Lower body dressing      What is the patient wearing?: Incontinence brief     Lower body assist Assist for lower body dressing: Moderate Assistance - Patient 50 - 74%     Toileting Toileting    Toileting assist Assist for toileting: Contact Guard/Touching assist     Transfers Chair/bed transfer  Transfers assist     Chair/bed transfer assist level: Supervision/Verbal cueing     Locomotion Ambulation   Ambulation assist      Assist level: Contact Guard/Touching assist Assistive device: Walker-rolling Max distance: 50'   Walk 10 feet activity   Assist     Assist level: Contact Guard/Touching assist Assistive device: Walker-rolling   Walk 50 feet activity   Assist    Assist level: Contact Guard/Touching assist Assistive device: Walker-rolling    Walk 150 feet activity   Assist Walk 150 feet activity did not occur: Safety/medical concerns (Fatigue)         Walk 10 feet on uneven surface  activity   Assist Walk 10 feet on uneven surfaces activity did not occur: Safety/medical concerns (Fatigue)  Wheelchair     Assist Is the patient using a wheelchair?: Yes Type of Wheelchair: Manual    Wheelchair assist level: Dependent - Patient 0%      Wheelchair 50 feet with 2 turns activity    Assist        Assist Level: Dependent - Patient 0%   Wheelchair 150 feet activity     Assist      Assist Level: Dependent - Patient 0%   Blood pressure 139/62, pulse 77, temperature 98.6 F (37  C), temperature source Oral, resp. rate 16, height 5\' 1"  (1.549 m), weight 92.3 kg, SpO2 97 %.    Medical Problem List and Plan: 1. Functional deficits secondary to debility from bacterial and viral PNA community acquired             -patient may  shower             -ELOS/Goals: 1/16--> 1/18, mod I, extended, discussed importance of going to therapy even if she does not feel 100%  2.  Antithrombotics: -DVT/anticoagulation:  Pharmaceutical: Other (comment) Eliquis for Afib             -antiplatelet therapy: Plavix 3. Pain Management: OxyIR 10mg  QID, flexeril 10mg  TID prn spasms RLE pain - pt states this was severe in NAD during my visit.  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 Mood: Ativan, 0.5mg  BID, Zoloft 100mg  qd             -antipsychotic agents: NA 5. Neuropsych: This patient is capable of making decisions on her own behalf. 6. Skin/Wound Care: MASD , probable mild candida and shearing , barrier cream, nystatin topical , transfer training  7. Fluids/Electrolytes/Nutrition: Monitor I/Os  8.  COPD prn alb inh, Brovana BID, Pulmicort nebs BID, Montelukast  9.  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  Hx ESRD, renal transplant 1991, Imuran, prednisone wean from 15mg  to 5mg  change Q3d  11.  HTN-  metoprolol Vitals:   08/31/21 0455 08/31/21 0727  BP: 139/62   Pulse: 77   Resp: 16   Temp: 98.6 F (37 C)   SpO2: 96% 97%  Controlled 1/14  12.  HLD , PAD crestor, clopidigrel 13.  Constipation due to opioids and immobility- senna-S, Miralax, Linzess  14.  RIght lung pulm edema , per pt takes diuretics intermittently at home, breathing is better today , good fluid intake, will use lasix prn     LOS: 6 days A FACE TO FACE EVALUATION WAS PERFORMED  Charlett Blake 08/31/2021, 7:37 AM

## 2021-08-31 NOTE — Progress Notes (Signed)
Pt notified this nurse that her RLE is c/o 8/10 pain. Pt is tearful and saying " I just can not do this, I need the blockage fixed in my leg. Its going to fall off." Pt is lying in bed RLE in the air, swinging it around to "move the blockage and stop the pain since nothing else is working." This nurse is providing emotional support to the pt and educating on the ordered medication regime. Provider will be notified of pts concerns.

## 2021-08-31 NOTE — Progress Notes (Signed)
Pt refused 0500 CHG bath.

## 2021-08-31 NOTE — Progress Notes (Signed)
Physical Therapy Session Note  Patient Details  Name: Katherine [name] MRN: [id number] Date of Birth: 1955-10-02  Today's Date: 08/31/2021 PT Individual Time: 0800-0857 PT Individual Time Calculation (min): 57 min   Short Term Goals: Week 1:  PT Short Term Goal 1 (Week 1): STG = LTG due to LOS  Skilled Therapeutic Interventions/Progress Updates:  Chart reviewed and pt agreeable to therapy. Pt received semi-reclined in bed with RN with 3L O2 via [place] and 2/10 c/o pain in RLE. Pt received taking medications. Once complete, session focused on functional mobility for self-care and patient skill for safe mobility in setting of O2 lines. Pt transferred to EOB with supervision and completed SPT to [facility] with supervision and no LOB. Pt required extensive time for toileting, but was able to manage hygiene with supervision. SpO2 assessed after activity with reading of 94%. Pt then completed stand-step-turn transfer to recliner with supervision + RW. At recliner, pt requested to change clothes, and was able to donn paper scrubs with SBA for line management. Pt reported discomfort in paper scrubs and returned to gown with supervision for dressing. No LOB during activities. Pt then completed 3x10 high knee marches + 3x30 SLS per leg with supervision + RW. Pt SpO2 95% at 3L O2 at end of session. At end of session, pt was left seated in recliner with 3L O2 via [place], alarm engaged, nurse call bell and all needs in reach.   Therapy Documentation Precautions:  Precautions Precautions: Fall Precaution Comments: monitor sats/HR, post covid debility/deconditioned Restrictions Weight Bearing Restrictions: No  Vital Signs: Oxygen Therapy SpO2: 97 % O2 Device: Nasal Cannula O2 Flow Rate (L/min): 3 L/min    Therapy/Group: Individual Therapy  Marquette Saa, PT, DPT 08/31/2021, 10:32 AM

## 2021-09-01 ENCOUNTER — Inpatient Hospital Stay (HOSPITAL_COMMUNITY): Payer: Medicare Other

## 2021-09-01 DIAGNOSIS — E669 Obesity, unspecified: Secondary | ICD-10-CM

## 2021-09-01 DIAGNOSIS — E1169 Type 2 diabetes mellitus with other specified complication: Secondary | ICD-10-CM | POA: Diagnosis not present

## 2021-09-01 DIAGNOSIS — Z951 Presence of aortocoronary bypass graft: Secondary | ICD-10-CM

## 2021-09-01 DIAGNOSIS — I209 Angina pectoris, unspecified: Secondary | ICD-10-CM

## 2021-09-01 DIAGNOSIS — I483 Typical atrial flutter: Secondary | ICD-10-CM

## 2021-09-01 DIAGNOSIS — G8929 Other chronic pain: Secondary | ICD-10-CM

## 2021-09-01 DIAGNOSIS — I10 Essential (primary) hypertension: Secondary | ICD-10-CM | POA: Diagnosis not present

## 2021-09-01 DIAGNOSIS — J1282 Pneumonia due to coronavirus disease 2019: Secondary | ICD-10-CM

## 2021-09-01 DIAGNOSIS — Z7901 Long term (current) use of anticoagulants: Secondary | ICD-10-CM

## 2021-09-01 DIAGNOSIS — I70263 Atherosclerosis of native arteries of extremities with gangrene, bilateral legs: Secondary | ICD-10-CM

## 2021-09-01 DIAGNOSIS — J449 Chronic obstructive pulmonary disease, unspecified: Secondary | ICD-10-CM

## 2021-09-01 DIAGNOSIS — Z9981 Dependence on supplemental oxygen: Secondary | ICD-10-CM

## 2021-09-01 DIAGNOSIS — M79604 Pain in right leg: Secondary | ICD-10-CM

## 2021-09-01 DIAGNOSIS — Z01810 Encounter for preprocedural cardiovascular examination: Secondary | ICD-10-CM

## 2021-09-01 DIAGNOSIS — E1140 Type 2 diabetes mellitus with diabetic neuropathy, unspecified: Secondary | ICD-10-CM

## 2021-09-01 DIAGNOSIS — I214 Non-ST elevation (NSTEMI) myocardial infarction: Secondary | ICD-10-CM

## 2021-09-01 DIAGNOSIS — N186 End stage renal disease: Secondary | ICD-10-CM

## 2021-09-01 DIAGNOSIS — Z940 Kidney transplant status: Secondary | ICD-10-CM

## 2021-09-01 DIAGNOSIS — G894 Chronic pain syndrome: Secondary | ICD-10-CM

## 2021-09-01 DIAGNOSIS — R5381 Other malaise: Secondary | ICD-10-CM | POA: Diagnosis not present

## 2021-09-01 DIAGNOSIS — I2510 Atherosclerotic heart disease of native coronary artery without angina pectoris: Secondary | ICD-10-CM

## 2021-09-01 DIAGNOSIS — Z5181 Encounter for therapeutic drug level monitoring: Secondary | ICD-10-CM

## 2021-09-01 DIAGNOSIS — I509 Heart failure, unspecified: Secondary | ICD-10-CM

## 2021-09-01 DIAGNOSIS — U071 COVID-19: Secondary | ICD-10-CM

## 2021-09-01 LAB — BASIC METABOLIC PANEL
Anion gap: 6 (ref 5–15)
BUN: 23 mg/dL (ref 8–23)
CO2: 28 mmol/L (ref 22–32)
Calcium: 8.8 mg/dL — ABNORMAL LOW (ref 8.9–10.3)
Chloride: 101 mmol/L (ref 98–111)
Creatinine, Ser: 0.75 mg/dL (ref 0.44–1.00)
GFR, Estimated: >60 mL/min (ref >60)
Glucose, Bld: 284 mg/dL — ABNORMAL HIGH (ref 70–99)
Potassium: 4 mmol/L (ref 3.5–5.1)
Sodium: 135 mmol/L (ref 135–145)

## 2021-09-01 LAB — CBC
HCT: 28.4 % — ABNORMAL LOW (ref 36.0–46.0)
Hemoglobin: 8.6 g/dL — ABNORMAL LOW (ref 12.0–15.0)
MCH: 31.7 pg (ref 26.0–34.0)
MCHC: 30.3 g/dL (ref 30.0–36.0)
MCV: 104.8 fL — ABNORMAL HIGH (ref 80.0–100.0)
Platelets: 137 10*3/uL — ABNORMAL LOW (ref 150–400)
RBC: 2.71 MIL/uL — ABNORMAL LOW (ref 3.87–5.11)
RDW: 16.7 % — ABNORMAL HIGH (ref 11.5–15.5)
WBC: 5.4 10*3/uL (ref 4.0–10.5)
nRBC: 0 % (ref 0.0–0.2)

## 2021-09-01 LAB — GLUCOSE, CAPILLARY
Glucose-Capillary: 287 mg/dL — ABNORMAL HIGH (ref 70–99)
Glucose-Capillary: 244 mg/dL — ABNORMAL HIGH (ref 70–99)
Glucose-Capillary: 211 mg/dL — ABNORMAL HIGH (ref 70–99)
Glucose-Capillary: 230 mg/dL — ABNORMAL HIGH (ref 70–99)

## 2021-09-01 LAB — TROPONIN I (HIGH SENSITIVITY): Troponin I (High Sensitivity): 52 ng/L — ABNORMAL HIGH (ref <18)

## 2021-09-01 MED ORDER — ISOSORBIDE DINITRATE 10 MG PO TABS
10.0000 mg | ORAL_TABLET | Freq: Two times a day (BID) | ORAL | Status: DC
  Administered 2021-09-01 – 2021-09-02 (×2): 10 mg via ORAL
  Filled 2021-09-01 (×3): qty 1

## 2021-09-01 MED ORDER — METOPROLOL TARTRATE 25 MG PO TABS
25.0000 mg | ORAL_TABLET | Freq: Three times a day (TID) | ORAL | Status: DC
  Administered 2021-09-01 – 2021-09-02 (×2): 25 mg via ORAL
  Filled 2021-09-01 (×2): qty 1

## 2021-09-01 MED ORDER — DICLOFENAC SODIUM 1 % EX GEL
2.0000 g | Freq: Three times a day (TID) | CUTANEOUS | Status: DC
  Administered 2021-09-02 (×2): 2 g via TOPICAL
  Filled 2021-09-01: qty 100

## 2021-09-01 NOTE — Consult Note (Addendum)
Hospital Consult    Reason for Consult:  RLE rest pain, abnormal ABI Requesting Physician:  Dierdre Harness, PA MRN #:  [id number]  History of Present Illness: This is a 66 y.o. female with significant medical history including CAD s/p CABG and multiple PCI, PAD, PAF on Eliquis, CHF, T2DM, HLD, ESRD with Renal transplant, Chronic pain, COPD oxygen dependent on 3L, who has been at [facility] since the middle of December admitted with COVID  and MRSA pneumonia now in inpatient rehab. Her hospital course was complicated by NSTEMI. She underwent a cardiac catheterization on 08/21/21. She explains that she has been having Right leg pain since the day after her cardiac cath. The pain has been from the right knee down into foot. This has been a constant pain, worse when lying flat and at night. However also painful on ambulation. Patient explains that prior to this she does have pain on ambulation. She says it is hard for her to describe. She has chronic back pain for which she sees pain management. Her chronic pain affects her ability to ambulate without discomfort. She is unable to really explain her lower extremity symptoms well. She has T2DM with neuropathy but says that she has full sensation in her feet. She placed ice on her right leg last evening and today and her pain is presently resolved upon my examination. She did have non invasive vascular lab studies on 1/14 showing non compressible ABI on the left with monophasic flow and dampened ABI on the right with monophasic flow. Arterial duplex indicating bilateral diffuse atherosclerosis with very diminished monophasic flow throughout.  Vascular surgery was consulted for evaluation of RLE rest pain with abnormal non invasive studies.   Past Medical History:  Diagnosis Date   Anemia    Anxiety    Bilateral carotid artery stenosis    Carotid duplex 0/1751: 0-25% LICA, 85-27% RICA, >78% RECA, f/u 1 yr suggested   CAD (coronary artery disease) of bypass graft 5/01;  3/'02, 8/'03, 10/'04; 1/15   PCI x 5 to SVG-D1    CAD in native artery 07/1993   3 Vessel Disease (LAD-D1 & RCA) -- CABG (Dx in setting of inferior STEMI-PTCA of RCA)   CAD S/P percutaneous coronary angioplasty    PCI to SVG-D1 insertion/native D1 x 4 = '01 -(S660 BMS 2.5 x 9 anastomosis- D1); '02 - distal overlap ACS Pixel 2.5 x 8  BMS; '03 distal/native ISR/Thrombosis - Pixel 2.5 x 13; '04 - ISR-  Taxus 2.5 x 20 (covered all);; 1/15 - mid SVG-D1 (50% distal ISR) - Promus P 2.75 x 20 -- 2.8 mm; 6/22: Extensive ISR PTCA & Anastomotic-Native Diag DES PCI (Frontier Onyx 2.25x12 (2.75-2.5 mm post-dilation   COPD mixed type ([place])    Followed by Dr. Lamonte Sakai "pulmonologist said no COPD"   Depression with anxiety    Diabetes mellitus type 2 in obese ([place])    Diarrhea    started after cholecystectomy and mass removed from intestine   Dyslipidemia, goal LDL below 70    08/2012: TC 137, TG 200, HDL 32!, LDL 45; on statin (followed by Dr.Deterding)   ESRD (end stage renal disease) ([place]) 1991   s/p Cadaveric Renal Transplant ([facility] - Dr. Jimmy Footman)    Family history of adverse reaction to anesthesia    mom's bp dropped during/after anesthesia   Fibromyalgia    GERD (gastroesophageal reflux disease)    Glomerulonephritis, chronic, rapidly progressive 1989   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 07/1993  Rescue PTCA of RCA -- referred for CABG.   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  Had having having lots of nausea and vomiting with poor p.o. intake.  Thought to have QT prolongation with multiple medications involved and hypomagnesemia, hypokalemia.  Tikosyn discontinued along with Zoloft and  Phenergan.   Hypertension associated with diabetes ([place])    Mild aortic stenosis by prior echocardiogram 07/2019   Echo:  Mild aortic stenosis (gradients: Mean 14.3 mmHg -peak 24.9 mmHg).   Morbid obesity ([facility])    MRSA (methicillin resistant staph aureus) culture positive    OSA (obstructive sleep apnea)    no longer on CPAP or home O2, states she doesn't need now after lap band   PAD (peripheral artery disease) ([place]) 08/2013   LEA Dopplers to be read by Dr. Fletcher Anon   PAF (paroxysmal atrial fibrillation) ([place]) 06/2014   Noted on CardioNet Monitor  - --> rhythm control with Tikosyn (Dr. Rayann Heman); converted from warfarin to apixaban for anticoagulation.   Pneumonia    Recurrent boils    Bilateral Groin   Rheumatoid arthritis ([place])    Per Patient Report; associated with OA   S/p cadaver renal transplant 1991   [facility]   Torsades de pointes 05/12/2021   Unstable angina ([place]) 5/01; 3/'02, 8/'03, 10/'04; 1/15   x 5 occurences since Inf-Post STEMI in 1994    Past Surgical History:  Procedure Laterality Date   ABDOMINAL AORTOGRAM N/A 04/21/2018   Procedure: ABDOMINAL AORTOGRAM;  Surgeon: [name], MD;  Location: [place] [facility];  Service: Cardiovascular;  Laterality: N/A;   CATHETER REMOVAL     CHOLECYSTECTOMY N/A 10/29/2014   Procedure: LAPAROSCOPIC CHOLECYSTECTOMY WITH INTRAOPERATIVE CHOLANGIOGRAM;  Surgeon: Excell Seltzer, MD;  Location: [facility];  Service: General;  Laterality: N/A;   CORONARY ANGIOPLASTY  1994   x5   CORONARY ARTERY BYPASS GRAFT  1995   LIMA-LAD, SVG-RPDA, SVG-D1   CORONARY STENT INTERVENTION N/A 02/20/2021   Procedure: PERCUTANEOUS CORONARY STENT INTERVENTION;  Surgeon: [name], MD;  Location: [place] [facility]; ostLCx 60% (Neg RFR 0.96);; SVG- D2 recurrent 90% ISR & 95% native D2 after graft-> DES PCI of 95% anastomotic D2 lesion (Onyx Frontier 2.25 mm x 12 mm => 2.75 mm @ overlap, 2.5 distal.);PTCA of ISR in body of graft. ->  2.5 mm scoring balloon  & post-dil w/ 2.75 mm Hazen balloon   ESOPHAGOGASTRODUODENOSCOPY N/A 10/15/2016   Procedure: ESOPHAGOGASTRODUODENOSCOPY (EGD);  Surgeon: Wilford Corner, MD;  Location: [facility] ENDOSCOPY;  Service: Endoscopy;  Laterality: N/A;   I & D EXTREMITY Right 01/29/2018   Procedure: IRRIGATION AND DEBRIDEMENT THUMB;  Surgeon: Dayna Barker, MD;  Location: [facility];  Service: Plastics;  Laterality: Right;   INCISE AND DRAIN ABCESS     INTRAVASCULAR PRESSURE WIRE/FFR STUDY N/A 02/20/2021   Procedure: INTRAVASCULAR PRESSURE WIRE/FFR STUDY;  Surgeon: [name], MD;  Location: [place] [facility];  Service: Cardiovascular;  Laterality: N/A;   KIDNEY TRANSPLANT  1991   KNEE ARTHROSCOPY WITH LATERAL MENISECTOMY Left 12/03/2017   Procedure: LEFT KNEE ARTHROSCOPY WITH LATERAL MENISECTOMY;  Surgeon: Earlie Server, MD;  Location: [place];  Service: Orthopedics;  Laterality: Left;   LAPAROSCOPIC GASTRIC BANDING  04/2004; 10/'09, 2/'10   Port Replacement x 2   LEFT HEART CATH AND CORONARY ANGIOGRAPHY N/A 02/20/2021   Procedure: LEFT HEART CATH AND CORONARY ANGIOGRAPHY;  Surgeon: [name], MD;  Location: [place] [facility];  Service: Cardiovascular;  Laterality: N/A;   LEFT HEART CATH AND CORS/GRAFTS ANGIOGRAPHY N/A 04/21/2018   Procedure: LEFT HEART CATH AND CORS/GRAFTS ANGIOGRAPHY;  Surgeon: [name], MD;  Location: [place] [facility];  Ost-Prox LAD 50% - proxLAD (pre & post D1) 100% CTO. Cx - patent, small OM1 (stable ~ ostial OM1 90%, too small for PCI) & 2 small LPL; Ost-distal RCA 100% CTO.  LIMA-LAD (not injected); SVG-dRCA patent, SVG-D1 - insertion stent ~20% ISR - Severe R CFA disease w/ focal Sub TO   LEFT HEART CATH AND CORS/GRAFTS ANGIOGRAPHY  5/'01, 3/'02, 8/'03, 10/'04; 1/'15   08/22/2013: LAD & RCA 100%; LIMA-LAD & SVG-rPDA patent; Cx-- OM1 60%, OM2 ostial ~50%; SVG-D1 - 80% mid, 50% distal ISR --PCI   LEFT HEART CATH AND CORS/GRAFTS ANGIOGRAPHY N/A 01/31/2021   Procedure: LEFT HEART CATH AND  CORS/GRAFTS ANGIOGRAPHY;  Surgeon: Jolaine Artist, MD;  Location: [facility];;   LEFT HEART CATH AND CORS/GRAFTS ANGIOGRAPHY N/A 08/21/2021   Procedure: LEFT HEART CATH AND CORS/GRAFTS ANGIOGRAPHY;  Surgeon: Belva Crome, MD;  Location: [place] [facility];  Service: Cardiovascular;  Laterality: N/A;   LEFT HEART CATHETERIZATION WITH CORONARY/GRAFT ANGIOGRAM N/A 08/23/2013   Procedure: LEFT HEART CATHETERIZATION WITH Beatrix Fetters;  Surgeon: Wellington Hampshire, MD;  Location: [place] [facility];  Service: Cardiovascular;  Laterality: N/A;   Lower Extremity Arterial Dopplers  08/2013   ABI: R 0.96, L 1.04   MULTIPLE TOOTH EXTRACTIONS  age 35   NM MYOVIEW LTD  03/2016   EF 62%. LOW RISK. C/W prior MI - no Ischemia. Apical hypokinesis.   PERCUTANEOUS CORONARY STENT INTERVENTION (PCI-S)  5/'01, 3/'02, 8/'03, 10/'04;   '01 - S660 BMS 2.5 x 9 - dSVG-D1 into D1; '02- post-stent stenosis - 2.5 x 8 Pixel BMS; '8\03: ISR/Thrombosis into native D1 - AngioJet, 2.5 x 13 Pixel; '04 - ISR 95% - covered stented area with Taxus DES 2.5 mm x 20 (2.88)   PERCUTANEOUS CORONARY STENT INTERVENTION (PCI-S)  08/23/2013   Procedure: PERCUTANEOUS CORONARY STENT INTERVENTION (PCI-S);  Surgeon: Wellington Hampshire, MD;  Location: [facility] CATH LAB;  Service: Cardiovascular;;mid SVG-D1 80%; distal stent ~50% ISR; Promus [place] DES 2.75 mm xc 20 mm (2.8 mm)   PORT-A-CATH REMOVAL     kidney   TRANSTHORACIC ECHOCARDIOGRAM  07/2019   a) 07/2019: EF 55 to 60%.  No LVH.  Paradoxical septal WM-s/p CABG.  GRII DD.  Nl RV size and fxn.  Mild bilateral atrial dilation.  Mod MAC.  Trace MR.  Mild AS (gradients: Mean 14.3 mmHg -peak 24.9 mmHg).; B) 06/2020: EF 40 to 45%.  Moderate concentric LVH.  GRII DD.  Elevated LAP.  Mod HK mid Apical Ant-AntSept wall & mild Apical Dyskinesis.  Mod LA dilation.  Mild MR.  AoV sclerosis w/o AS.   TRANSTHORACIC ECHOCARDIOGRAM  01/30/2021   EF 55 to 60%.  Mild LVH.  GR 1 DD.  Elevated LAP.   Moderate LA dilation.  Mild MR with mild MS.  Mild aortic valve stenosis.:   TUBAL LIGATION     wrist fistula repair Left    dialysis for one year    Allergies  Allergen Reactions   Tetracycline Hives    Patient tolerated Doxycycline Dec 2020   Niacin Other (See Comments)    Mouth blisters   Niaspan [Niacin Er] Other (See Comments)    Mouth blisters   Sulfa Antibiotics Nausea Only and Other (See Comments)    "Tears up stomach"   Sulfonamide Derivatives Other (See Comments)    Reaction: per patient "tears her stomach up"   Codeine Nausea And Vomiting   Erythromycin Nausea And Vomiting   Hydromorphone Hcl Nausea And Vomiting   Morphine And Related Nausea And Vomiting   Nalbuphine Nausea And Vomiting    Nubain   Sulfasalazine Nausea Only and Other (See Comments)    per patient "tears her stomach up", "Tears up stomach"   Tape Rash and Other (See Comments)    No "plastic" tape," please----cloth tape only    Prior to Admission medications   Medication Sig Start Date End Date Taking? Authorizing Provider  acetaminophen (TYLENOL) 500 MG tablet Take 1,000 mg by mouth daily.    [provider]  albuterol (VENTOLIN HFA) 108 (90 Base) MCG/ACT inhaler TAKE 2 PUFFS BY MOUTH EVERY 6 HOURS AS NEEDED FOR WHEEZE OR SHORTNESS OF BREATH Patient taking differently: 2 puffs every 6 (six) hours as needed for wheezing or shortness of breath. 02/10/21   Byrum, Rose Fillers, MD  apixaban (ELIQUIS) 5 MG TABS tablet Take 1 tablet (5 mg total) by mouth 2 (two) times daily. APPOINTMENT NEEDED WITH CARDIOLOGIST FOR FURTHER REFILLS Patient taking differently: Take 5 mg by mouth 2 (two) times daily. 04/17/21   Sherran Needs, NP  arformoterol (BROVANA) 15 MCG/2ML NEBU Take 2 mLs (15 mcg total) by nebulization 2 (two) times daily. 08/30/19   Collene Gobble, MD  azaTHIOprine (IMURAN) 50 MG tablet Take 125 mg by mouth See admin instructions. Take 2 1/2 tablets (125 mg) by mouth daily at 3pm    [provider]  budesonide (PULMICORT) 0.5 MG/2ML nebulizer solution Take 2 mLs (0.5 mg total) by nebulization 2 (two) times daily. 08/24/19   [name], MD  calcitRIOL (ROCALTROL) 0.25 MCG capsule Take 0.25 mcg by mouth every 3 (three) days.     [provider]  cetirizine (ZYRTEC) 5 MG tablet Take 5 mg by mouth daily.    [provider]  clopidogrel (PLAVIX) 75 MG tablet Take 1 tablet (75 mg total) by mouth daily. 02/22/21   Cheryln Manly, NP  clotrimazole (MYCELEX) 10 MG troche Take 1 tablet (10 mg total) by mouth 5 (five) times daily as needed (thrush). Patient not taking: Reported on 06/19/2021 02/03/21   Edwin Dada, MD  cyclobenzaprine (FLEXERIL) 10 MG tablet Take 1 tablet (10 mg total) by mouth 3 (three) times daily as needed for muscle spasms. 08/25/21   Kathie Dike, MD  fluticasone (FLONASE) 50 MCG/ACT nasal spray PLACE 2 SPRAYS INTO BOTH NOSTRILS 2 TIMES DAILY. Patient taking differently: 2 sprays 2 (two) times daily as needed for allergies. 06/27/21   Collene Gobble, MD  glimepiride (AMARYL) 4 MG tablet Take 4 mg by mouth daily. 02/03/20   [provider]  hydrocortisone (ANUSOL-HC) 25 MG suppository Place 1 suppository (25 mg total) rectally 2 (two) times daily. 08/25/21   Kathie Dike, MD  insulin aspart (NOVOLOG FLEXPEN) 100 UNIT/ML FlexPen Inject 4-10 Units into the skin 3 (three) times daily with meals. Per sliding scale :not provided    [provider]  Insulin Glargine (BASAGLAR KWIKPEN) 100 UNIT/ML Inject  20 Units into the skin daily before breakfast. 08/25/21   Kathie Dike, MD  lamoTRIgine (LAMICTAL) 25 MG tablet TAKE 1 TABLET BY MOUTH EVERYDAY AT BEDTIME Patient taking differently: Take 25 mg by mouth at bedtime. 07/25/21   Meredith Staggers, MD  linaclotide [facility]) 145 MCG CAPS capsule Take 1 capsule (145 mcg total) by mouth daily before breakfast. 08/25/21   Kathie Dike, MD  LORazepam (ATIVAN) 1 MG tablet Take 0.5  tablets (0.5 mg total) by mouth 2 (two) times daily as needed for anxiety. 08/25/21   Kathie Dike, MD  magnesium oxide (MAG-OX) 400 (240 Mg) MG tablet Take 1 tablet (400 mg total) by mouth daily. Patient not taking: Reported on 05/07/2021 04/23/21   Flora Lipps, MD  metFORMIN (GLUCOPHAGE) 500 MG tablet Take 1 tablet (500 mg total) by mouth 2 (two) times daily. 02/22/21   Burnell Blanks, MD  metoprolol succinate (TOPROL XL) 25 MG 24 hr tablet Take 0.5 tablets (12.5 mg total) by mouth daily. 08/25/21 08/25/22  Kathie Dike, MD  montelukast (SINGULAIR) 10 MG tablet TAKE 1 TABLET BY MOUTH EVERYDAY AT BEDTIME Patient taking differently: Take 10 mg by mouth at bedtime. 06/23/21   Collene Gobble, MD  nitroGLYCERIN (NITROSTAT) 0.4 MG SL tablet Place 1 tablet (0.4 mg total) under the tongue every 5 (five) minutes as needed for chest pain. 02/21/21   Burnell Blanks, MD  nystatin (MYCOSTATIN/NYSTOP) powder Apply topically 3 (three) times daily. 08/25/21   Kathie Dike, MD  Oxycodone HCl 10 MG TABS Take 1 tablet (10 mg total) by mouth 4 (four) times daily as needed. Patient taking differently: Take 10 mg by mouth 4 (four) times daily as needed (pain). 07/24/21   Bayard Hugger, NP  OXYGEN Inhale 3 L into the lungs continuous.    [provider]  pantoprazole (PROTONIX) 40 MG tablet TAKE 1 TABLET BY MOUTH EVERY DAY Patient taking differently: 40 mg daily. 07/31/21   [name], MD  polyethylene glycol (MIRALAX / GLYCOLAX) 17 g packet Take 17 g by mouth 2 (two) times daily. 08/25/21   Kathie Dike, MD  predniSONE (DELTASONE) 10 MG tablet Take 77m po daily for 3 days then 1393mpo daily for 3 days then 93m12maily 08/25/21   MemKathie DikeD  promethazine (PHENERGAN) 25 MG tablet Take 25 mg by mouth every 6 (six) hours as needed for nausea or vomiting.    [provider]  Respiratory Therapy Supplies (NEBULIZER) DEVI Pt needs nebulizr machine dx J44.9 07/17/21   ByrCollene GobbleD  rosuvastatin (CRESTOR) 20 MG tablet TAKE 1 TABLET BY MOUTH EVERY DAY Patient taking differently: Take 20 mg by mouth daily. 06/16/21   HarLeonie ManD  senna-docusate (SENOKOT-S) 8.6-50 MG tablet Take 2 tablets by mouth 2 (two) times daily. 08/25/21   MemKathie DikeD  sertraline (ZOLOFT) 100 MG tablet Take 1 tablet (100 mg total) by mouth daily. 06/06/21   HarLeonie ManD    Social History   Socioeconomic History   Marital status: Widowed    Spouse name: Not on file   Number of children: Not on file   Years of education: Not on file   Highest education level: Not on file  Occupational History   Not on file  Tobacco Use   Smoking status: Every Day    Packs/day: 1.00    Years: 30.00    Pack years: 30.00    Types: Cigarettes    Last  attempt to quit: 08/17/2002    Years since quitting: 19.0   Smokeless tobacco: Never  Vaping Use   Vaping Use: Never used  Substance and Sexual Activity   Alcohol use: No   Drug use: No   Sexual activity: Not on file  Other Topics Concern   Not on file  Social History Narrative   ** Merged History Encounter ** She is currently married, and the caregiver of her husband who is recovering from surgery for tongue cancer now diagnosed with lung cancer. Prior to his diagnosis of her husband, she actually had adopted a 65-year-old child who she knows caring for as well. With all the surgeries, they have been quite financially troubled. Thanks the help of her community and church, they have been able to stay "alfoat."     She is a former smoker who quit in 2004 after a 30-pack-year history.   She is active chasing a 66-year-old child, does not do routine exercise. She's been quite depressed with the condition of her husband, and admits to eating comfort herself.   She does not drink alcohol.      05/13/2021 Patient reports that her husband is deceased. She is grieving the loss of her husband and the decline in her physical independence.     Social Determinants of Health   Financial Resource Strain: Not on file  Food Insecurity: No Food Insecurity   Worried About Charity fundraiser in the Last Year: Never true   Ran Out of Food in the Last Year: Never true  Transportation Needs: No Transportation Needs   Lack of Transportation (Medical): No   Lack of Transportation (Non-Medical): No  Physical Activity: Not on file  Stress: Not on file  Social Connections: Not on file  Intimate Partner Violence: Not on file     Family History  Problem Relation Age of Onset   Cancer Mother        liver   Heart disease Father    Cancer Father        colon   Arrhythmia Brother        Atrial Fibrillation   Arrhythmia Paternal Aunt        Atrial Fibrillation    ROS: Otherwise negative unless mentioned in HPI  Physical Examination  Vitals:   09/01/21 0738 09/01/21 1312  BP:  122/89  Pulse: 73 66  Resp: 16 19  Temp:  98.2 F (36.8 C)  SpO2: 93% 98%   Body mass index is 38.45 kg/m.  General:  WDWN in NAD Gait: Not observed HENT: WNL, normocephalic Pulmonary: normal non-labored breathing, without wheezing Cardiac: regular, without  Murmurs, rubs or gallops; without carotid bruits Abdomen: obese, soft, NT/ND, no masses Vascular Exam/Pulses: No palpable femoral pulses, no palpable distal pulses. Monophasic PT bilaterally. Feet warm and well perfused.  Extremities: without ischemic changes, without Gangrene , without cellulitis; without open wounds;  Musculoskeletal: no muscle wasting or atrophy  Neurologic: A&O X 3;  No focal weakness or paresthesias are detected; speech is fluent/normal Psychiatric:  The pt has Normal affect.   CBC    Component Value Date/Time   WBC 5.4 09/01/2021 0345   RBC 2.71 (L) 09/01/2021 0345   HGB 8.6 (L) 09/01/2021 0345   HCT 28.4 (L) 09/01/2021 0345   PLT 137 (L) 09/01/2021 0345   MCV 104.8 (H) 09/01/2021 0345   MCH 31.7 09/01/2021 0345   MCHC 30.3 09/01/2021 0345   RDW 16.7 (H)  09/01/2021 0345   LYMPHSABS  1.2 08/26/2021 0311   MONOABS 0.8 08/26/2021 0311   EOSABS 0.1 08/26/2021 0311   BASOSABS 0.0 08/26/2021 0311    BMET    Component Value Date/Time   NA 135 09/01/2021 0345   NA 134 (A) 01/04/2018 0000   K 4.0 09/01/2021 0345   CL 101 09/01/2021 0345   CO2 28 09/01/2021 0345   GLUCOSE 284 (H) 09/01/2021 0345   BUN 23 09/01/2021 0345   BUN 17 01/04/2018 0000   CREATININE 0.75 09/01/2021 0345   CREATININE 0.90 06/26/2014 1108   CALCIUM 8.8 (L) 09/01/2021 0345   GFRNONAA >60 09/01/2021 0345   GFRAA 58 (L) 04/12/2020 1400    COAGS: Lab Results  Component Value Date   INR 1.2 01/31/2021   INR 1.1 07/11/2020   INR 3.3 (A) 11/28/2019     Non-Invasive Vascular Imaging:   +-------+----------------+-----------+------------+------------+   ABI/TBI Today's ABI      Today's TBI Previous ABI Previous TBI   +-------+----------------+-----------+------------+------------+   Right   0.76             0.27        0.96         0.70           +-------+----------------+-----------+------------+------------+   Left    non compressible 0.63        1.04         0.71           +-------+----------------+-----------+------------+------------+   VAS [place] Lower Lower Extremity Arterial Duplex: +-----------+--------+-----+--------+----------+---------------------+   RIGHT       PSV cm/s Ratio Stenosis Waveform   Comments                +-----------+--------+-----+--------+----------+---------------------+   CFA Prox    52                      monophasic                         +-----------+--------+-----+--------+----------+---------------------+   DFA         22                      monophasic                         +-----------+--------+-----+--------+----------+---------------------+   SFA Prox    43                      monophasic collateral flow noted   +-----------+--------+-----+--------+----------+---------------------+   SFA Mid     35                       monophasic                         +-----------+--------+-----+--------+----------+---------------------+   SFA Distal  32                      monophasic                         +-----------+--------+-----+--------+----------+---------------------+   POP Prox    22                      monophasic                         +-----------+--------+-----+--------+----------+---------------------+  POP Distal  32                      monophasic                         +-----------+--------+-----+--------+----------+---------------------+   ATA Prox    21                      monophasic                         +-----------+--------+-----+--------+----------+---------------------+   ATA Mid     25                      monophasic                         +-----------+--------+-----+--------+----------+---------------------+   ATA Distal  30                      monophasic                         +-----------+--------+-----+--------+----------+---------------------+   PTA Prox    39                      monophasic                         +-----------+--------+-----+--------+----------+---------------------+   PTA Mid     39                      monophasic                         +-----------+--------+-----+--------+----------+---------------------+   PTA Distal  53                      monophasic                         +-----------+--------+-----+--------+----------+---------------------+   PERO Prox                           absent                             +-----------+--------+-----+--------+----------+---------------------+   PERO Mid                            absent                             +-----------+--------+-----+--------+----------+---------------------+   PERO Distal                         absent                             +-----------+--------+-----+--------+----------+---------------------+   +----------+--------+-----+--------+----------+--------+   LEFT       PSV  cm/s Ratio Stenosis Waveform   Comments   +----------+--------+-----+--------+----------+--------+   CFA Prox   52  monophasic            +----------+--------+-----+--------+----------+--------+   DFA        45                      monophasic            +----------+--------+-----+--------+----------+--------+   SFA Prox   57                      monophasic            +----------+--------+-----+--------+----------+--------+   SFA Mid    46                      monophasic            +----------+--------+-----+--------+----------+--------+   SFA Distal 32                      monophasic            +----------+--------+-----+--------+----------+--------+   POP Prox   31                      monophasic            +----------+--------+-----+--------+----------+--------+   POP Distal 24                      monophasic            +----------+--------+-----+--------+----------+--------+   ATA Prox   21                      monophasic            +----------+--------+-----+--------+----------+--------+   ATA Mid    25                      monophasic            +----------+--------+-----+--------+----------+--------+   ATA Distal 14                      monophasic            +----------+--------+-----+--------+----------+--------+   PTA Prox   30                      monophasic            +----------+--------+-----+--------+----------+--------+   PTA Mid    28                      monophasic            +----------+--------+-----+--------+----------+--------+   PTA Distal 25                      monophasic            +----------+--------+-----+--------+----------+--------+   Summary:  Right: Atherosclerotic plaque noted throughout. Monophasic waveforms suggestive of iliofemoral disease. Some collateral flow noted at the mid SFA.   Left: Atherosclerotic plaque noted throughout. Monophasic waveforms suggestive of iliofemoral disease.   Statin:  Yes.   Beta Blocker:  Yes.    Aspirin:  No. ACEI:  No. ARB:  No. CCB use:  No Other antiplatelets/anticoagulants:  Yes.   Eliquis   ASSESSMENT/PLAN: This is a 66 y.o. female with multiple medical co morbidities who was admitted with COVID pneumonia. Her hospitalization was complicated by NSTEMI.  She subsequently underwent cardiac cath on 08/21/21. The day following her cath she developed rest pain in her right lower extremity. With continued complaints of pain in her right leg she had non invasive studies showing non compressible ABI on the left with monophasic flow and dampened ABI on the right with monophasic flow. Arterial duplex indicating bilateral diffuse atherosclerosis with very diminished monophasic flow throughout. She has a long history of PAD. Likely following her recent catheterization via right femoral access she may have occluded some runoff into her right lower extremity making her acutely symptomatic. She does not have classic rest pain and upon examination today her pain was resolved. She has no tissue loss. She has no palpable femoral pulses bilaterally indicating severe aorto occlusive disease.  With her multiple medical comorbidities she would be at high risk for any open intervention and may not have any endovascular intervention options. She should continue Plavix and Statin. The on call vascular surgeon, Dr. Donzetta Matters, will see patient this afternoon to provide further evaluation and management plans   Marval Regal [facility] [phone] 09/01/2021  3:48 PM  I have independently interviewed and examined patient and agree with PA assessment and plan above.  Unfortunately on complex vascular problem in a patient with multiple medical comorbidities.  I discussed this with her and given her at times severe pain in the right foot she is hopeful for revascularization.  I will need to discuss with cardiology prior to considering any surgical intervention which would include bilateral common  femoral endarterectomies possible retrograde common iliac artery stenting and possible femoral to femoral bypass and possible right axillary to bilateral femoral bypass depending on the outcomes of stenting.  I discussed the high risk nature of this given her underlying cardiopulmonary disease and severity of her vascular disease.  She demonstrates good understanding and is adamant that we proceed with surgical intervention later this week regardless of the possible complications.  Possibly consider surgical intervention Friday and will need to have him hold Eliquis if we proceed.  Ramonia Mcclaran C. Donzetta Matters, MD [facility] [place] Office: [phone] Pager: [phone]

## 2021-09-01 NOTE — Progress Notes (Signed)
PROGRESS NOTE   Subjective/Complaints: Pt reports ongoing right leg pain. Says it's from knee running down shin to ankle. Bothers her a lot when she's lying in bed.    ROS: Patient denies fever, rash, sore throat, blurred vision, nausea, vomiting, diarrhea, cough, shortness of breath or chest pain,   headache, or mood change.  Objective:   VAS [place] ABI WITH/WO TBI  Result Date: 08/30/2021  LOWER EXTREMITY DOPPLER STUDY Patient Name:  Katherine [name]  Date of Exam:   08/30/2021 Medical Rec #: [id number]           Accession #:    [id number] Date of Birth: October 14, 1955           Patient Gender: F Patient Age:   66 years Exam Location:  [facility] Procedure:      VAS [place] ABI WITH/WO TBI Referring Phys: Alysia Penna --------------------------------------------------------------------------------  Indications: Rest pain, specifically around the shin area. Pperipheral artery              disease, (cath attempted 08/18/2021- Bilateral iliofemoral              obstruction) preventing catheterization. 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 High Risk         Hypertension, hyperlipidemia, Diabetes, coronary artery Factors:          disease. Other Factors: Recent Covid pneumonia, NSTEMI, history of renal transplant. CABG                1994.  Vascular Interventions: No prior. Comparison Study: Prior LEA done 08/28/13 indicating possible inflow disease on                   the right suggested by common femoral waveforms and severe                   bilateral common femoral artery stenosis. Performing Technologist: Sharion Dove RVS  Examination Guidelines: A complete evaluation includes at minimum, Doppler waveform signals and systolic blood pressure reading at the level of bilateral brachial, anterior tibial, and posterior tibial arteries, when  vessel segments are accessible. Bilateral testing is considered an integral part of a complete examination. Photoelectric Plethysmograph (PPG) waveforms and toe systolic pressure readings are included as required and additional duplex testing as needed. Limited examinations for reoccurring indications may be performed as noted.  ABI Findings: +---------+------------------+-----+-------------------+--------+  Right     Rt Pressure (mmHg) Index Waveform            Comment   +---------+------------------+-----+-------------------+--------+  Brachial  115                      multiphasic                   +---------+------------------+-----+-------------------+--------+  PTA       87                 0.76  dampened  monophasic           +---------+------------------+-----+-------------------+--------+  DP        22                 0.19  dampened monophasic           +---------+------------------+-----+-------------------+--------+  Great Toe 31                 0.27                                +---------+------------------+-----+-------------------+--------+ +---------+------------------+-----+----------+--------------------+  Left      Lt Pressure (mmHg) Index Waveform   Comment               +---------+------------------+-----+----------+--------------------+  Brachial                                      restricted (fistula)  +---------+------------------+-----+----------+--------------------+  PTA       254                2.21  monophasic                       +---------+------------------+-----+----------+--------------------+  DP        254                2.21  monophasic                       +---------+------------------+-----+----------+--------------------+  Great Toe 73                 0.63                                   +---------+------------------+-----+----------+--------------------+ +-------+----------------+-----------+------------+------------+  ABI/TBI Today's ABI      Today's TBI Previous  ABI Previous TBI  +-------+----------------+-----------+------------+------------+  Right   0.76             0.27        0.96         0.70          +-------+----------------+-----------+------------+------------+  Left    non compressible 0.63        1.04         0.71          +-------+----------------+-----------+------------+------------+ Right ABIs appear decreased compared to prior study on 08/28/13. Bilateral TBIs appear decreased compared to prior study on 08/28/13. Left ABI is now non compressible since study done 08/28/13  Summary: Right: Resting right ankle-brachial index indicates moderate right lower extremity arterial disease. The right toe-brachial index is abnormal. ABI indicates a moderate decrease in flow by velocity, however, significantly dampened waveforms indicates a more severe level of disease. Left: Resting left ankle-brachial index indicates noncompressible left lower extremity arteries. The left toe-brachial index is abnormal.  *See table(s) above for measurements and observations.  Electronically signed by Jamelle Haring on 08/30/2021 at 3:15:06 PM.    Final    VAS [place] LOWER EXTREMITY ARTERIAL DUPLEX  Result Date: 08/30/2021 LOWER EXTREMITY ARTERIAL DUPLEX STUDY Patient Name:  Katherine [name]  Date of Exam:   08/30/2021 Medical Rec #: [id number]           Accession #:    [id number] Date of Birth:  01/10/1956           Patient Gender: F Patient Age:   37 years Exam Location:  [facility] Procedure:      VAS [place] LOWER EXTREMITY ARTERIAL DUPLEX Referring Phys: Alysia Penna --------------------------------------------------------------------------------  Indications: Rest pain,specifically at shin area. 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 High Risk Factors: Hypertension, hyperlipidemia, Diabetes,  prior MI, coronary                    artery disease. Other Factors: Kidney transplant, CABG, recent Covid pneumonia.  Vascular Interventions: No prior intervention. Current ABI:            R:0.76 L:non compressible Comparison Study: Prior LEA done 08/28/13 indicating possible inflow disease on                   the right suggested by right CFA waveforms. Severe bilateral                   CFA stenosis. Performing Technologist: Sharion Dove RVS  Examination Guidelines: A complete evaluation includes B-mode imaging, spectral Doppler, color Doppler, and power Doppler as needed of all accessible portions of each vessel. Bilateral testing is considered an integral part of a complete examination. Limited examinations for reoccurring indications may be performed as noted.  +-----------+--------+-----+--------+----------+---------------------+  RIGHT       PSV cm/s Ratio Stenosis Waveform   Comments               +-----------+--------+-----+--------+----------+---------------------+  CFA Prox    52                      monophasic                        +-----------+--------+-----+--------+----------+---------------------+  DFA         22                      monophasic                        +-----------+--------+-----+--------+----------+---------------------+  SFA Prox    43                      monophasic collateral flow noted  +-----------+--------+-----+--------+----------+---------------------+  SFA Mid     35                      monophasic                        +-----------+--------+-----+--------+----------+---------------------+  SFA Distal  32                      monophasic                        +-----------+--------+-----+--------+----------+---------------------+  POP Prox    22                      monophasic                        +-----------+--------+-----+--------+----------+---------------------+  POP Distal  32                      monophasic                         +-----------+--------+-----+--------+----------+---------------------+  ATA Prox    21                      monophasic                        +-----------+--------+-----+--------+----------+---------------------+  ATA Mid     25                      monophasic                        +-----------+--------+-----+--------+----------+---------------------+  ATA Distal  30                      monophasic                        +-----------+--------+-----+--------+----------+---------------------+  PTA Prox    39                      monophasic                        +-----------+--------+-----+--------+----------+---------------------+  PTA Mid     39                      monophasic                        +-----------+--------+-----+--------+----------+---------------------+  PTA Distal  53                      monophasic                        +-----------+--------+-----+--------+----------+---------------------+  PERO Prox                           absent                            +-----------+--------+-----+--------+----------+---------------------+  PERO Mid                            absent                            +-----------+--------+-----+--------+----------+---------------------+  PERO Distal                         absent                            +-----------+--------+-----+--------+----------+---------------------+   +----------+--------+-----+--------+----------+--------+  LEFT       PSV cm/s Ratio Stenosis Waveform   Comments  +----------+--------+-----+--------+----------+--------+  CFA Prox   52                      monophasic           +----------+--------+-----+--------+----------+--------+  DFA        45                      monophasic           +----------+--------+-----+--------+----------+--------+  SFA Prox   57                      monophasic           +----------+--------+-----+--------+----------+--------+  SFA Mid    46                      monophasic            +----------+--------+-----+--------+----------+--------+  SFA Distal 32                      monophasic           +----------+--------+-----+--------+----------+--------+  POP Prox   31                      monophasic           +----------+--------+-----+--------+----------+--------+  POP Distal 24                      monophasic           +----------+--------+-----+--------+----------+--------+  ATA Prox   21                      monophasic           +----------+--------+-----+--------+----------+--------+  ATA Mid    25                      monophasic           +----------+--------+-----+--------+----------+--------+  ATA Distal 14                      monophasic           +----------+--------+-----+--------+----------+--------+  PTA Prox   30                      monophasic           +----------+--------+-----+--------+----------+--------+  PTA Mid    28                      monophasic           +----------+--------+-----+--------+----------+--------+  PTA Distal 25                      monophasic           +----------+--------+-----+--------+----------+--------+  Summary: Right: Atherosclerotic plaque noted throughout. Monophasic waveforms suggestive of iliofemoral disease. Some collateral flow noted at the mid SFA. Left: Atherosclerotic plaque noted throughout. Monophasic waveforms suggestive of iliofemoral disease.  See table(s) above for measurements and observations. Electronically signed by Jamelle Haring on 08/30/2021 at 3:16:10 PM.    Final    Recent Labs    09/01/21 0345  WBC 5.4  HGB 8.6*  HCT 28.4*  PLT 137*   Recent Labs    09/01/21 0345  NA 135  K 4.0  CL 101  CO2 28  GLUCOSE 284*  BUN 23  CREATININE 0.75  CALCIUM 8.8*   No intake or output data in the 24 hours ending 09/01/21 1101      Physical Exam: Vital Signs Blood  pressure (!) 110/58, pulse 73, temperature 98 F (36.7 C), resp. rate 16, height 5\' 1"  (1.549 m), weight 92.3 kg, SpO2 93 %.   Constitutional: No distress  . Vital signs reviewed. HEENT: NCAT, EOMI, oral membranes moist Neck: supple Cardiovascular: RRR without murmur. No JVD    Respiratory/Chest: CTA Bilaterally without wheezes or rales. Normal effort    GI/Abdomen: BS +, non-tender, non-distended Ext: no clubbing, cyanosis, or edema. Limbs warm, normal color Psych: pleasant and cooperative  Skin: No evidence of breakdown, no evidence of rash  Neurologic: Cranial nerves II through XII intact, motor strength is 4/5 in bilateral deltoid, bicep, tricep, grip, hip flexor, knee extensors, ankle dorsiflexor and plantar flexor   Musculoskeletal: does have anterior as well as med/lateral joint pain at right knee with palpation and ROM    Assessment/Plan: 1. Functional deficits which require 3+ hours per day of interdisciplinary therapy in a comprehensive inpatient rehab setting. Physiatrist is providing close team supervision and 24 hour management of active medical problems listed below. Physiatrist and rehab team continue to assess barriers to discharge/monitor patient progress toward functional and medical goals  Care Tool:  Bathing    Body parts bathed by patient: Right arm, Left arm, Chest, Abdomen, Right upper leg, Front perineal area, Left upper leg, Face   Body parts bathed by helper: Buttocks, Right lower leg, Left lower leg, Right arm, Left arm, Chest, Abdomen, Front perineal area, Right upper leg, Left upper leg, Face     Bathing assist Assist Level: Moderate Assistance - Patient 50 - 74%     Upper Body Dressing/Undressing Upper body dressing   What is the patient wearing?: Hospital gown only    Upper body assist Assist Level: Minimal Assistance - Patient > 75%    Lower Body Dressing/Undressing Lower body dressing      What is the patient wearing?: Incontinence brief     Lower body assist Assist for lower body dressing: Moderate Assistance - Patient 50 - 74%     Toileting Toileting    Toileting assist Assist for  toileting: Supervision/Verbal cueing     Transfers Chair/bed transfer  Transfers assist     Chair/bed transfer assist level: Supervision/Verbal cueing     Locomotion Ambulation   Ambulation assist      Assist level: Contact Guard/Touching assist Assistive device: Walker-rolling Max distance: 50'   Walk 10 feet activity   Assist     Assist level: Contact Guard/Touching assist Assistive device: Walker-rolling   Walk 50 feet activity   Assist    Assist level: Contact Guard/Touching assist Assistive device: Walker-rolling    Walk 150 feet activity   Assist Walk 150 feet activity did not occur: Safety/medical concerns (Fatigue)         Walk 10 feet on uneven surface  activity   Assist Walk 10 feet on uneven surfaces activity did not occur: Safety/medical concerns (Fatigue)         Wheelchair     Assist Is the patient using a wheelchair?: Yes Type of Wheelchair: Manual    Wheelchair assist level: Dependent - Patient 0%      Wheelchair 50 feet with 2 turns activity    Assist        Assist Level: Dependent - Patient 0%   Wheelchair 150 feet activity     Assist      Assist Level: Dependent - Patient 0%   Blood pressure (!) 110/58, pulse 73, temperature 98 F (36.7 C), resp. rate 16, height  5\' 1"  (1.549 m), weight 92.3 kg, SpO2 93 %.    Medical Problem List and Plan: 1. Functional deficits secondary to debility from bacterial and viral PNA community acquired             -patient may  shower             -ELOS/Goals:  1/18, mod I --Continue CIR therapies including PT, OT    2.  Antithrombotics: -DVT/anticoagulation:  Pharmaceutical: Other (comment) Eliquis for Afib             -antiplatelet therapy: Plavix 3. 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 Will ask vascular surgery for opinion -Pain also at right knee--check xray today, added voltaren gel 4. Mood: Ativan, 0.5mg  BID, Zoloft 100mg  qd             -antipsychotic agents: NA 5. Neuropsych: This patient is capable of making decisions on her own behalf. 6. Skin/Wound Care: MASD , probable mild candida and shearing , barrier cream, nystatin topical , transfer training  7. Fluids/Electrolytes/Nutrition: Monitor I/Os  8.  COPD prn alb inh, Brovana BID, Pulmicort nebs BID, Montelukast  9.  DM glimipride, metformin, insulin glargine 20U qam, Novalog 8U TID ac   CBG (last 3)  Recent Labs    08/31/21 1646 08/31/21 2044 09/01/21 0546  GLUCAP 187* 162* 287*  Increase glargine to 20U Metformin changed to 1000mg  qam  Prednisone reduce to 10mg  yesterday , tolerating wean , CBGs still elevated, should be ready to reduce to home dose 5mg  on TUes 1/17  -cover with SSI until prednisone reduced  10.  Hx ESRD, renal transplant 1991, Imuran, prednisone wean from 15mg  to 5mg  change Q3d  11.  HTN- metoprolol Vitals:   09/01/21 0548 09/01/21 0738  BP: (!) 110/58   Pulse: 76 73  Resp: 20 16  Temp: 98 F (36.7 C)   SpO2: 96% 93%  Controlled 1/16  12.  HLD , PAD crestor, clopidigrel 13.  Constipation due to opioids and immobility- senna-S, Miralax, Linzess  14.  RIght lung pulm edema , per pt takes diuretics intermittently at home, breathing is better/ will use lasix prn     Filed Weights   08/29/21 2332 08/30/21 0520 08/31/21 0500  Weight: 93.1 kg 93.1 kg 92.3 kg     LOS: 7 days A FACE TO FACE EVALUATION WAS PERFORMED  Meredith Staggers 09/01/2021, 11:01 AM

## 2021-09-01 NOTE — Progress Notes (Signed)
Orthopedic Tech Progress Note Patient Details:  Katherine [name] Sep 21, 1955 [id number]  Ortho Devices Type of Ortho Device: ASO Ortho Device/Splint Location: delivered to rn.      Katherine [name] 09/01/2021, 5:43 AM

## 2021-09-01 NOTE — Consult Note (Signed)
CARDIOLOGY CONSULT NOTE       Patient ID: Katherine [name] MRN: [id number] DOB/AGE: February 11, 1956 66 y.o.  Admit date: 08/25/2021 Referring Physician: Donzetta Matters Primary Physician: [name], MD Primary Cardiologist: Ellyn Hack Reason for Consultation: Preoperative/Angina/PVD  Principal Problem:   Debility Active Problems:   Pneumonia, viral   Post covid-19 condition, unspecified   HPI:  66 y.o. with complex past medical history Admitted since mid December with COVID pneumonia and SEMI Now in rehab. CABG in 1995 with LIMA to LAD, SVG diagonal and SVG to RCA. Cath June 2022 showed CTO native RCA/LAD with 60% native LCX stenosis with negative FFR 02/20/21 and two layers of stents in SVG to D2. RCA graft patent with 40% distal stenosis. While hospitalized for COVID had SEMI with troponin peak 4000. Attempted cath by Dr Tamala Julian 08/21/21 aborted with no access from legs Previous fistula in left wrist  His note indicated would only do repeat cath for acute ST elevation MI. EF has been 45-50% with no active CHF. She has PAF and is on eliquis. Rates have been high in rehab. She has had a previous renal transplant with Cr surprisingly normal at 0.75.  She has COPD and is on home oxygen.   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 Appears to have occlude iliacs bilaterally with monophasic pulses Right ABI is 0.76 and left non compressible She has no resting pain, ulcers or gangrene    ROS All other systems reviewed and negative except as noted above  Past Medical History:  Diagnosis Date   Anemia    Anxiety    Bilateral carotid artery stenosis    Carotid duplex 11/2704: 2-37% LICA, 62-83% RICA, >15% RECA, f/u 1 yr suggested   CAD (coronary artery disease) of bypass graft 5/01;  3/'02, 8/'03, 10/'04; 1/15   PCI x 5 to SVG-D1    CAD in native artery 07/1993   3 Vessel Disease (LAD-D1 & RCA) -- CABG (Dx in setting of inferior STEMI-PTCA of RCA)   CAD S/P percutaneous coronary angioplasty    PCI to SVG-D1 insertion/native D1 x 4 = '01 -(S660 BMS 2.5 x 9 anastomosis- D1); '02 - distal overlap ACS Pixel 2.5 x 8  BMS; '03 distal/native ISR/Thrombosis - Pixel 2.5 x 13; '04 - ISR-  Taxus 2.5 x 20 (covered all);; 1/15 - mid SVG-D1 (50% distal ISR) - Promus P 2.75 x 20 -- 2.8 mm; 6/22: Extensive ISR PTCA & Anastomotic-Native Diag DES PCI (Frontier Onyx 2.25x12 (2.75-2.5 mm post-dilation   COPD mixed type ([place])    Followed by Dr. Lamonte Sakai "pulmonologist said no COPD"   Depression with anxiety    Diabetes mellitus type 2 in obese ([place])    Diarrhea    started after cholecystectomy and mass removed from intestine   Dyslipidemia, goal LDL below 70    08/2012: TC 137, TG 200, HDL 32!, LDL 45; on statin (followed by Dr.Deterding)   ESRD (end stage renal disease) ([place]) 1991   s/p Cadaveric Renal Transplant ([facility] - Dr. Jimmy Footman)    Family history of adverse reaction to anesthesia    mom's bp dropped during/after anesthesia   Fibromyalgia    GERD (gastroesophageal reflux disease)    Glomerulonephritis, chronic, rapidly progressive 1989   H/O  ST elevation myocardial infarction (STEMI) of inferoposterior wall 07/1993   Rescue PTCA of RCA -- referred for CABG.   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  Had having having lots of nausea and vomiting with poor p.o. intake.  Thought to have QT prolongation with multiple medications involved and hypomagnesemia, hypokalemia.  Tikosyn discontinued along with Zoloft and  Phenergan.   Hypertension associated with diabetes ([place])    Mild aortic stenosis by prior echocardiogram 07/2019   Echo:  Mild aortic stenosis (gradients: Mean 14.3 mmHg -peak 24.9 mmHg).   Morbid obesity ([facility])    MRSA (methicillin resistant staph aureus) culture positive    OSA (obstructive sleep apnea)    no longer on CPAP or home O2, states she doesn't need now after lap band   PAD (peripheral artery disease) ([place]) 08/2013   LEA Dopplers to be read by Dr. Fletcher Anon   PAF (paroxysmal atrial fibrillation) ([place]) 06/2014   Noted on CardioNet Monitor  - --> rhythm control with Tikosyn (Dr. Rayann Heman); converted from warfarin to apixaban for anticoagulation.   Pneumonia    Recurrent boils    Bilateral Groin   Rheumatoid arthritis ([place])    Per Patient Report; associated with OA   S/p cadaver renal transplant 1991   [facility]   Torsades de pointes 05/12/2021   Unstable angina ([place]) 5/01; 3/'02, 8/'03, 10/'04; 1/15   x 5 occurences since Inf-Post STEMI in 1994    Family History  Problem Relation Age of Onset   Cancer Mother        liver   Heart disease Father    Cancer Father        colon   Arrhythmia Brother        Atrial Fibrillation   Arrhythmia Paternal Aunt        Atrial Fibrillation    Social History   Socioeconomic History   Marital status: Widowed    Spouse name: Not on file   Number of children: Not on file   Years of education: Not on file   Highest education level: Not on file  Occupational History   Not on file  Tobacco Use   Smoking status: Every Day    Packs/day: 1.00    Years: 30.00    Pack years: 30.00    Types: Cigarettes    Last attempt to quit: 08/17/2002    Years since quitting: 19.0   Smokeless tobacco: Never  Vaping Use   Vaping Use: Never used  Substance and Sexual Activity   Alcohol use: No   Drug use: No   Sexual activity: Not on file  Other Topics Concern   Not on file  Social History Narrative   ** Merged History Encounter ** She is currently  married, and the caregiver of her husband who is recovering from surgery for tongue cancer now diagnosed with lung cancer. Prior to his diagnosis of her husband, she actually had adopted a 74-year-old child who she knows caring for as well. With all the surgeries, they have been quite financially troubled. Thanks the help of her community and church, they have been able to stay "alfoat."  She is a former smoker who quit in 2004 after a 30-pack-year history.   She is active chasing a 52-year-old child, does not do routine exercise. She's been quite depressed with the condition of her husband, and admits to eating comfort herself.   She does not drink alcohol.      05/13/2021 Patient reports that her husband is deceased. She is grieving the loss of her husband and the decline in her physical independence.    Social Determinants of Health   Financial Resource Strain: Not on file  Food Insecurity: No Food Insecurity   Worried About Charity fundraiser in the Last Year: Never true   Ran Out of Food in the Last Year: Never true  Transportation Needs: No Transportation Needs   Lack of Transportation (Medical): No   Lack of Transportation (Non-Medical): No  Physical Activity: Not on file  Stress: Not on file  Social Connections: Not on file  Intimate Partner Violence: Not on file    Past Surgical History:  Procedure Laterality Date   ABDOMINAL AORTOGRAM N/A 04/21/2018   Procedure: ABDOMINAL AORTOGRAM;  Surgeon: [name], MD;  Location: [place] [facility];  Service: Cardiovascular;  Laterality: N/A;   CATHETER REMOVAL     CHOLECYSTECTOMY N/A 10/29/2014   Procedure: LAPAROSCOPIC CHOLECYSTECTOMY WITH INTRAOPERATIVE CHOLANGIOGRAM;  Surgeon: Excell Seltzer, MD;  Location: [facility];  Service: General;  Laterality: N/A;   CORONARY ANGIOPLASTY  1994   x5   CORONARY ARTERY BYPASS GRAFT  1995   LIMA-LAD, SVG-RPDA, SVG-D1   CORONARY STENT INTERVENTION N/A 02/20/2021   Procedure: PERCUTANEOUS  CORONARY STENT INTERVENTION;  Surgeon: [name], MD;  Location: [place] [facility]; ostLCx 60% (Neg RFR 0.96);; SVG- D2 recurrent 90% ISR & 95% native D2 after graft-> DES PCI of 95% anastomotic D2 lesion (Onyx Frontier 2.25 mm x 12 mm => 2.75 mm @ overlap, 2.5 distal.);PTCA of ISR in body of graft. ->  2.5 mm scoring balloon & post-dil w/ 2.75 mm [place] balloon   ESOPHAGOGASTRODUODENOSCOPY N/A 10/15/2016   Procedure: ESOPHAGOGASTRODUODENOSCOPY (EGD);  Surgeon: Wilford Corner, MD;  [facility];  Service: Endoscopy;  Laterality: N/A;   I & D EXTREMITY Right 01/29/2018   Procedure: IRRIGATION AND DEBRIDEMENT THUMB;  Surgeon: Dayna Barker, MD;  Location: [place];  Service: Plastics;  Laterality: Right;   INCISE AND DRAIN ABCESS     INTRAVASCULAR PRESSURE WIRE/FFR STUDY N/A 02/20/2021   Procedure: INTRAVASCULAR PRESSURE WIRE/FFR STUDY;  Surgeon: [name], MD;  Location: [place] [facility];  Service: Cardiovascular;  Laterality: N/A;   KIDNEY TRANSPLANT  1991   KNEE ARTHROSCOPY WITH LATERAL MENISECTOMY Left 12/03/2017   Procedure: LEFT KNEE ARTHROSCOPY WITH LATERAL MENISECTOMY;  Surgeon: Earlie Server, MD;  Location: [place];  Service: Orthopedics;  Laterality: Left;   LAPAROSCOPIC GASTRIC BANDING  04/2004; 10/'09, 2/'10   Port Replacement x 2   LEFT HEART CATH AND CORONARY ANGIOGRAPHY N/A 02/20/2021   Procedure: LEFT HEART CATH AND CORONARY ANGIOGRAPHY;  Surgeon: [name], MD;  Location: [place] [facility];  Service: Cardiovascular;  Laterality: N/A;   LEFT HEART CATH AND CORS/GRAFTS ANGIOGRAPHY N/A 04/21/2018   Procedure: LEFT HEART CATH AND CORS/GRAFTS ANGIOGRAPHY;  Surgeon: [name], MD;  Location: [place] [facility];  Ost-Prox LAD 50% - proxLAD (pre & post D1) 100% CTO. Cx - patent, small OM1 (stable ~ ostial OM1 90%, too small for PCI) & 2 small LPL; Ost-distal RCA 100% CTO.  LIMA-LAD (not injected); SVG-dRCA patent, SVG-D1 - insertion stent ~20% ISR - Severe  R CFA disease w/ focal Sub TO   LEFT HEART CATH AND CORS/GRAFTS ANGIOGRAPHY  5/'01, 3/'02, 8/'03, 10/'04; 1/'15   08/22/2013: LAD & RCA 100%; LIMA-LAD & SVG-rPDA patent; Cx-- OM1 60%, OM2 ostial ~50%; SVG-D1 - 80% mid, 50% distal ISR --PCI   LEFT HEART CATH AND CORS/GRAFTS ANGIOGRAPHY N/A 01/31/2021   Procedure: LEFT HEART CATH AND CORS/GRAFTS ANGIOGRAPHY;  Surgeon: Jolaine Artist, MD;  Location: [facility];;   LEFT HEART CATH AND CORS/GRAFTS ANGIOGRAPHY N/A 08/21/2021   Procedure: LEFT HEART CATH AND CORS/GRAFTS ANGIOGRAPHY;  Surgeon: Belva Crome, MD;  Location: [place] [facility];  Service: Cardiovascular;  Laterality: N/A;   LEFT HEART CATHETERIZATION WITH CORONARY/GRAFT ANGIOGRAM N/A 08/23/2013   Procedure: LEFT HEART CATHETERIZATION WITH Beatrix Fetters;  Surgeon: Wellington Hampshire, MD;  Location: [place] [facility];  Service: Cardiovascular;  Laterality: N/A;   Lower Extremity Arterial Dopplers  08/2013   ABI: R 0.96, L 1.04   MULTIPLE TOOTH EXTRACTIONS  age 64   NM MYOVIEW LTD  03/2016   EF 62%. LOW RISK. C/W prior MI - no Ischemia. Apical hypokinesis.   PERCUTANEOUS CORONARY STENT INTERVENTION (PCI-S)  5/'01, 3/'02, 8/'03, 10/'04;   '01 - S660 BMS 2.5 x 9 - dSVG-D1 into D1; '02- post-stent stenosis - 2.5 x 8 Pixel BMS; '8\03: ISR/Thrombosis into native D1 - AngioJet, 2.5 x 13 Pixel; '04 - ISR 95% - covered stented area with Taxus DES 2.5 mm x 20 (2.88)   PERCUTANEOUS CORONARY STENT INTERVENTION (PCI-S)  08/23/2013   Procedure: PERCUTANEOUS CORONARY STENT INTERVENTION (PCI-S);  Surgeon: Wellington Hampshire, MD;  Location: [facility] CATH LAB;  Service: Cardiovascular;;mid SVG-D1 80%; distal stent ~50% ISR; Promus [place] DES 2.75 mm xc 20 mm (2.8 mm)   PORT-A-CATH REMOVAL     kidney   TRANSTHORACIC ECHOCARDIOGRAM  07/2019   a) 07/2019: EF 55 to 60%.  No LVH.  Paradoxical septal WM-s/p CABG.  GRII DD.  Nl RV size and fxn.  Mild bilateral atrial dilation.  Mod MAC.  Trace MR.  Mild AS  (gradients: Mean 14.3 mmHg -peak 24.9 mmHg).; B) 06/2020: EF 40 to 45%.  Moderate concentric LVH.  GRII DD.  Elevated LAP.  Mod HK mid Apical Ant-AntSept wall & mild Apical Dyskinesis.  Mod LA dilation.  Mild MR.  AoV sclerosis w/o AS.   TRANSTHORACIC ECHOCARDIOGRAM  01/30/2021   EF 55 to 60%.  Mild LVH.  GR 1 DD.  Elevated LAP.  Moderate LA dilation.  Mild MR with mild MS.  Mild aortic valve stenosis.:   TUBAL LIGATION     wrist fistula repair Left    dialysis for one year      Current Facility-Administered Medications:    (feeding supplement) PROSource Plus liquid 30 mL, 30 mL, Oral, BID BM, Love, Pamela S, PA-C, 30 mL at 09/01/21 9562   acetaminophen (TYLENOL) tablet 325-650 mg, 325-650 mg, Oral, Q4H PRN, Love, Pamela S, PA-C, 650 mg at 08/30/21 1634   alum & mag hydroxide-simeth (MAALOX/MYLANTA) 200-200-20 MG/5ML suspension 30 mL, 30 mL, Oral, Q4H PRN, Love, Pamela S, PA-C   apixaban (ELIQUIS) tablet 5 mg, 5 mg, Oral, BID, Love, Pamela S, PA-C, 5 mg at 09/01/21 0809   arformoterol (BROVANA) nebulizer solution 15 mcg, 15 mcg, Nebulization, BID, Love, Pamela S, PA-C, 15 mcg at 09/01/21 1308   ascorbic acid (VITAMIN C) tablet 500 mg, 500 mg, Oral,  Daily, Love, Pamela S, PA-C, 500 mg at 09/01/21 0809   azaTHIOprine (IMURAN) tablet 125 mg, 125 mg, Oral, Daily, Love, Pamela S, PA-C, 125 mg at 09/01/21 6606   bisacodyl (DULCOLAX) suppository 10 mg, 10 mg, Rectal, Daily PRN, Love, Pamela S, PA-C, 10 mg at 08/31/21 1811   budesonide (PULMICORT) nebulizer solution 0.5 mg, 2 mL, Nebulization, BID, Love, Pamela S, PA-C, 0.5 mg at 09/01/21 3016   calcitRIOL (ROCALTROL) capsule 0.25 mcg, 0.25 mcg, Oral, Q3 days, Love, Pamela S, PA-C, 0.25 mcg at 09/01/21 0109   Chlorhexidine Gluconate Cloth 2 % PADS 6 each, 6 each, Topical, Daily, Love, Pamela S, PA-C, 6 each at 09/01/21 3235   clopidogrel (PLAVIX) tablet 75 mg, 75 mg, Oral, Daily, Love, Pamela S, PA-C, 75 mg at 09/01/21 0810   cyclobenzaprine (FLEXERIL)  tablet 10 mg, 10 mg, Oral, TID PRN, Love, Pamela S, PA-C, 10 mg at 09/01/21 1600   diclofenac Sodium (VOLTAREN) 1 % topical gel 2 g, 2 g, Topical, TID, Meredith Staggers, MD   diphenhydrAMINE (BENADRYL) 12.5 MG/5ML elixir 12.5-25 mg, 12.5-25 mg, Oral, Q6H PRN, Love, Pamela S, PA-C   fluticasone (FLONASE) 50 MCG/ACT nasal spray 2 spray, 2 spray, Each Nare, Daily, Love, Pamela S, PA-C, 2 spray at 09/01/21 0811   gabapentin (NEURONTIN) capsule 100 mg, 100 mg, Oral, QHS, Kirsteins, Luanna Salk, MD, 100 mg at 08/31/21 2215   guaiFENesin-dextromethorphan (ROBITUSSIN DM) 100-10 MG/5ML syrup 10 mL, 10 mL, Oral, QID, Love, Pamela S, PA-C, 10 mL at 09/01/21 1600   guaiFENesin-dextromethorphan (ROBITUSSIN DM) 100-10 MG/5ML syrup 5-10 mL, 5-10 mL, Oral, Q6H PRN, Love, Pamela S, PA-C   hydrALAZINE (APRESOLINE) tablet 25 mg, 25 mg, Oral, Q8H PRN, Love, Pamela S, PA-C   hydrocerin (EUCERIN) cream, , Topical, BID, Meredith Staggers, MD, Given at 09/01/21 [phone]   hydrocortisone (ANUSOL-HC) suppository 25 mg, 25 mg, Rectal, BID, Love, Pamela S, PA-C, 25 mg at 09/01/21 0810   insulin aspart (novoLOG) injection 0-5 Units, 0-5 Units, Subcutaneous, QHS, Love, Pamela S, PA-C, 2 Units at 08/30/21 2116   insulin aspart (novoLOG) injection 0-9 Units, 0-9 Units, Subcutaneous, TID WC, Love, Pamela S, PA-C, 3 Units at 09/01/21 1755   insulin aspart (novoLOG) injection 8 Units, 8 Units, Subcutaneous, TID WC, Love, Pamela S, PA-C, 8 Units at 09/01/21 1755   insulin glargine-yfgn (SEMGLEE) injection 20 Units, 20 Units, Subcutaneous, Daily, Kirsteins, Luanna Salk, MD, 20 Units at 09/01/21 0807   ipratropium-albuterol (DUONEB) 0.5-2.5 (3) MG/3ML nebulizer solution 3 mL, 3 mL, Nebulization, Q4H PRN, Love, Pamela S, PA-C   isosorbide dinitrate (ISORDIL) tablet 10 mg, 10 mg, Oral, BID, Josue Hector, MD   lamoTRIgine (LAMICTAL) tablet 25 mg, 25 mg, Oral, QHS, Love, Pamela S, PA-C, 25 mg at 08/31/21 2215   linaclotide (LINZESS) capsule 145  mcg, 145 mcg, Oral, QAC breakfast, Love, Pamela S, PA-C, 145 mcg at 09/01/21 0552   linagliptin (TRADJENTA) tablet 5 mg, 5 mg, Oral, Daily, Love, Pamela S, PA-C, 5 mg at 09/01/21 2025   loratadine (CLARITIN) tablet 10 mg, 10 mg, Oral, Daily, Love, Pamela S, PA-C, 10 mg at 09/01/21 0809   LORazepam (ATIVAN) tablet 0.5 mg, 0.5 mg, Oral, BID PRN, Love, Pamela S, PA-C, 0.5 mg at 09/01/21 0808   magic mouthwash, 5 mL, Oral, QID, Love, Pamela S, PA-C, 5 mL at 09/01/21 1600   melatonin tablet 5 mg, 5 mg, Oral, QHS PRN, Love, Pamela S, PA-C, 5 mg at 08/31/21 2216   menthol-cetylpyridinium (CEPACOL) lozenge  3 mg, 1 lozenge, Oral, PRN, Love, Pamela S, PA-C   metFORMIN (GLUCOPHAGE) tablet 1,000 mg, 1,000 mg, Oral, Q breakfast, Kirsteins, Luanna Salk, MD, 1,000 mg at 09/01/21 0809   metoprolol tartrate (LOPRESSOR) tablet 25 mg, 25 mg, Oral, TID, Josue Hector, MD   montelukast (SINGULAIR) tablet 10 mg, 10 mg, Oral, QHS, Love, Pamela S, PA-C, 10 mg at 08/31/21 2216   morphine 2 MG/ML injection 0.5 mg, 0.5 mg, Intravenous, Q4H PRN, Love, Pamela S, PA-C   multivitamin with minerals tablet 1 tablet, 1 tablet, Oral, Daily, Love, Pamela S, PA-C, 1 tablet at 09/01/21 0809   nitroGLYCERIN (NITROSTAT) SL tablet 0.4 mg, 0.4 mg, Sublingual, Q5 min PRN, Love, Pamela S, PA-C, 0.4 mg at 09/01/21 1830   nutrition supplement (JUVEN) (JUVEN) powder packet 1 packet, 1 packet, Oral, BID BM, Love, Pamela S, PA-C, 1 packet at 09/01/21 1243   nystatin (MYCOSTATIN/NYSTOP) topical powder, , Topical, TID, Love, Pamela S, PA-C, Given at 09/01/21 0818   oxyCODONE (Oxy IR/ROXICODONE) immediate release tablet 10 mg, 10 mg, Oral, Q6H PRN, Love, Pamela S, PA-C, 10 mg at 09/01/21 1600   pantoprazole (PROTONIX) EC tablet 40 mg, 40 mg, Oral, Daily, Love, Pamela S, PA-C, 40 mg at 09/01/21 0809   polyethylene glycol (MIRALAX / GLYCOLAX) packet 17 g, 17 g, Oral, TID, Love, Pamela S, PA-C, 17 g at 09/01/21 5631   polyethylene glycol (MIRALAX /  GLYCOLAX) packet 17 g, 17 g, Oral, Daily PRN, Love, Pamela S, PA-C   predniSONE (DELTASONE) tablet 10 mg, 10 mg, Oral, Q breakfast, Kirsteins, Luanna Salk, MD, 10 mg at 09/01/21 0809   prochlorperazine (COMPAZINE) tablet 5-10 mg, 5-10 mg, Oral, Q6H PRN, 10 mg at 08/31/21 0737 **OR** prochlorperazine (COMPAZINE) injection 5-10 mg, 5-10 mg, Intramuscular, Q6H PRN **OR** prochlorperazine (COMPAZINE) suppository 12.5 mg, 12.5 mg, Rectal, Q6H PRN, Love, Pamela S, PA-C   protein supplement (ENSURE MAX) liquid, 11 oz, Oral, TID BM, Love, Pamela S, PA-C, 11 oz at 09/01/21 1359   rosuvastatin (CRESTOR) tablet 20 mg, 20 mg, Oral, Daily, Love, Pamela S, PA-C, 20 mg at 09/01/21 0809   senna-docusate (Senokot-S) tablet 2 tablet, 2 tablet, Oral, BID, Love, Pamela S, PA-C, 2 tablet at 09/01/21 0809   sertraline (ZOLOFT) tablet 100 mg, 100 mg, Oral, Daily, Love, Pamela S, PA-C, 100 mg at 09/01/21 0809   sodium chloride (OCEAN) 0.65 % nasal spray 1 spray, 1 spray, Each Nare, PRN, Love, Pamela S, PA-C   sodium chloride flush (NS) 0.9 % injection 10-40 mL, 10-40 mL, Intracatheter, Q12H, Love, Pamela S, PA-C, 10 mL at 09/01/21 0818   sodium chloride flush (NS) 0.9 % injection 10-40 mL, 10-40 mL, Intracatheter, PRN, Love, Pamela S, PA-C, 10 mL at 08/26/21 0323   sodium phosphate (FLEET) 7-19 GM/118ML enema 1 enema, 1 enema, Rectal, Once PRN, Love, Pamela S, PA-C   terbinafine (LAMISIL) 1 % cream, , Topical, BID, Love, Pamela S, PA-C, Given at 09/01/21 4970   zinc sulfate capsule 220 mg, 220 mg, Oral, Daily, Love, Pamela S, PA-C, 220 mg at 09/01/21 0809  (feeding supplement) PROSource Plus  30 mL Oral BID BM   apixaban  5 mg Oral BID   arformoterol  15 mcg Nebulization BID   vitamin C  500 mg Oral Daily   azaTHIOprine  125 mg Oral Daily   budesonide  2 mL Nebulization BID   calcitRIOL  0.25 mcg Oral Q3 days   Chlorhexidine Gluconate Cloth  6 each Topical Daily  clopidogrel  75 mg Oral Daily   diclofenac Sodium  2 g  Topical TID   fluticasone  2 spray Each Nare Daily   gabapentin  100 mg Oral QHS   guaiFENesin-dextromethorphan  10 mL Oral QID   hydrocerin   Topical BID   hydrocortisone  25 mg Rectal BID   insulin aspart  0-5 Units Subcutaneous QHS   insulin aspart  0-9 Units Subcutaneous TID WC   insulin aspart  8 Units Subcutaneous TID WC   insulin glargine-yfgn  20 Units Subcutaneous Daily   isosorbide dinitrate  10 mg Oral BID   lamoTRIgine  25 mg Oral QHS   linaclotide  145 mcg Oral QAC breakfast   linagliptin  5 mg Oral Daily   loratadine  10 mg Oral Daily   magic mouthwash  5 mL Oral QID   metFORMIN  1,000 mg Oral Q breakfast   metoprolol tartrate  25 mg Oral TID   montelukast  10 mg Oral QHS   multivitamin with minerals  1 tablet Oral Daily   nutrition supplement (JUVEN)  1 packet Oral BID BM   nystatin   Topical TID   pantoprazole  40 mg Oral Daily   polyethylene glycol  17 g Oral TID   predniSONE  10 mg Oral Q breakfast   Ensure Max Protein  11 oz Oral TID BM   rosuvastatin  20 mg Oral Daily   senna-docusate  2 tablet Oral BID   sertraline  100 mg Oral Daily   sodium chloride flush  10-40 mL Intracatheter Q12H   terbinafine   Topical BID   zinc sulfate  220 mg Oral Daily     Physical Exam: Blood pressure 114/68, pulse (!) 105, temperature 98.2 F (36.8 C), temperature source Oral, resp. rate 19, height _0  (1.549 m), weight 92.3 kg, SpO2 98 %.    Affect appropriate Healthy:  appears stated age Chronically ill obese female  HEENT: normal Neck supple with no adenopathy JVP normal no bruits no thyromegaly Lungs clear with no wheezing and good diaphragmatic motion Heart:  S1/S2 no murmur, no rub, gallop or click PMI normal Abdomen: benighn, transplanted kidney LLQ Occluded left wrist fistula Femoral pulses absent and difficult to palpate pedal pulses Plus on right PT with long RR interval pulse  No ulcers or dependant rubor Elevation pallor bilaterally     Labs:    Lab Results  Component Value Date   WBC 5.4 09/01/2021   HGB 8.6 (L) 09/01/2021   HCT 28.4 (L) 09/01/2021   MCV 104.8 (H) 09/01/2021   PLT 137 (L) 09/01/2021    Recent Labs  Lab 08/26/21 0311 09/01/21 0345  NA 135 135  K 3.8 4.0  CL 97* 101  CO2 31 28  BUN 24* 23  CREATININE 0.81 0.75  CALCIUM 8.8* 8.8*  PROT 5.4*  --   BILITOT 0.6  --   ALKPHOS 144*  --   ALT 23  --   AST 21  --   GLUCOSE 174* 284*   Lab Results  Component Value Date   CKTOTAL 90 03/30/2021   CKMB 2.5 09/18/2010   TROPONINI <0.03 04/20/2018    Lab Results  Component Value Date   CHOL 99 02/20/2021   CHOL 111 11/30/2017   Lab Results  Component Value Date   HDL 51 02/20/2021   HDL 53.90 11/30/2017   Lab Results  Component Value Date   LDLCALC 15 02/20/2021   LDLCALC 32 11/30/2017  Lab Results  Component Value Date   TRIG 165 (H) 02/20/2021   TRIG 129.0 11/30/2017   Lab Results  Component Value Date   CHOLHDL 1.9 02/20/2021   CHOLHDL 2 11/30/2017   No results found for: LDLDIRECT    Radiology: DG Chest 2 View  Result Date: 08/29/2021 CLINICAL DATA:  Shortness of breath EXAM: CHEST - 2 VIEW COMPARISON:  08/14/2021 FINDINGS: Cardiac shadow remains enlarged. Postsurgical changes are noted. Increased vascular congestion and mild pulmonary edema right greater than left is noted. Left-sided PICC line is again identified likely in the left SVC. No pneumothorax or sizable effusion is seen. No bony abnormality is noted. IMPRESSION: CHF with parenchymal edema worse on the right than the left. These results will be called to the ordering clinician or representative by the Radiologist Assistant, and communication documented in the PACS or [employer]. Electronically Signed   By: Inez Catalina M.D.   On: 08/29/2021 22:11   DG Knee 1-2 Views Right  Result Date: 09/01/2021 CLINICAL DATA:  Hervey Ard right knee pain, no injury. EXAM: RIGHT KNEE - 1-2 VIEW COMPARISON:  None. FINDINGS: Lateral view is  slightly rotated. No definite joint effusion. No fracture. Chondrocalcinosis in the lateral compartment. Scattered surgical clips in the soft tissues. IMPRESSION: 1. No acute findings. 2. Chondrocalcinosis in the lateral compartment of the knee. Electronically Signed   By: Lorin Picket M.D.   On: 09/01/2021 11:41   CT Angio Chest Pulmonary Embolism (PE) W or WO Contrast  Result Date: 08/05/2021 CLINICAL DATA:  PE suspected, shortness of breath, COVID positive EXAM: CT ANGIOGRAPHY CHEST WITH CONTRAST TECHNIQUE: Multidetector CT imaging of the chest was performed using the standard protocol during bolus administration of intravenous contrast. Multiplanar CT image reconstructions and MIPs were obtained to evaluate the vascular anatomy. CONTRAST:  77m OMNIPAQUE IOHEXOL 350 MG/ML SOLN COMPARISON:  None. FINDINGS: Cardiovascular: Satisfactory opacification of the pulmonary arteries to the segmental level. Examination for pulmonary embolism is limited by breath motion artifact. Within this limitation, no evidence of pulmonary embolism through the segmental pulmonary arterial level. Cardiomegaly. Extensive 3 vessel coronary artery calcifications status post median sternotomy and CABG. No pericardial effusion. Aortic atherosclerosis. Mediastinum/Nodes: No enlarged mediastinal, hilar, or axillary lymph nodes. Thyroid gland, trachea, and esophagus demonstrate no significant findings. Lungs/Pleura: Very extensive heterogeneous and ground-glass airspace opacity throughout the lungs. No pleural effusion or pneumothorax. Upper Abdomen: No acute abnormality. Status post cholecystectomy. Adjustable gastric lap band. Very atrophic kidneys. Musculoskeletal: No chest wall abnormality. No acute or significant osseous findings. Review of the MIP images confirms the above findings. IMPRESSION: 1. Examination for pulmonary embolism is limited by breath motion artifact. Within this limitation, no evidence of pulmonary embolism  through the segmental pulmonary arterial level. 2. Very extensive heterogeneous and ground-glass airspace opacity throughout the lungs, consistent with COVID-19 airspace disease. 3. Cardiomegaly and coronary artery disease. Aortic Atherosclerosis (ICD10-I70.0). Electronically Signed   By: ADelanna AhmadiM.D.   On: 08/05/2021 12:58   MR LUMBAR SPINE WO CONTRAST  Result Date: 08/29/2021 CLINICAL DATA:  Initial evaluation for low back pain with right lower extremity pain. Immunocompromised. EXAM: MRI LUMBAR SPINE WITHOUT CONTRAST TECHNIQUE: Multiplanar, multisequence MR imaging of the lumbar spine was performed. No intravenous contrast was administered. COMPARISON:  Prior MRI from 08/25/2017. FINDINGS: Segmentation: Standard. Lowest well-formed disc space labeled the L5-S1 level. Alignment: 4 mm anterolisthesis of L4 on L5, chronic and facet mediated, and mildly progressed from prior. Trace retrolisthesis of T11 on T12, stable. Alignment otherwise  normal with preservation of the normal lumbar lordosis. Vertebrae: Vertebral body height maintained without acute or chronic fracture. Bone marrow signal intensity within normal limits. No discrete or worrisome osseous lesions or abnormal marrow edema. Conus medullaris and cauda equina: Conus extends to the L1-2 level. Conus and cauda equina appear normal. Paraspinal and other soft tissues: Paraspinous soft tissues demonstrate no acute finding. The visualized native kidneys are markedly atrophic. Multifocal atheromatous irregularity seen throughout the intra-abdominal aorta without visible aneurysm. Disc levels: T10-11: Seen only on sagittal projection. Intervertebral disc space narrowing with disc desiccation and mild disc bulge. Bilateral facet hypertrophy. No significant spinal stenosis. Foramina appear grossly patent. T11-12: Intervertebral disc space narrowing with mild disc bulge and disc desiccation. No significant stenosis. T12-L1: Unremarkable. L1-2: Disc  desiccation without significant disc bulge. No canal or foraminal stenosis. L2-3: Disc desiccation without significant disc bulge. No canal or foraminal stenosis. L3-4: Mild intervertebral disc space narrowing with diffuse disc bulge. Superimposed broad-based left foraminal to extraforaminal disc protrusion contacts the exiting left L3 nerve root (series 6, image 24). Associated annular fissure. Mild bilateral facet hypertrophy. No significant spinal stenosis. Mild left L3 foraminal narrowing. Right neural foramina remains patent. L4-5: 4 mm anterolisthesis. Mild disc bulge with disc desiccation. Superimposed left foraminal to extraforaminal disc protrusion contacts the exiting left L4 nerve root (series 3, image 12). Advanced bilateral facet arthrosis, progressed from previous. No significant canal or lateral recess stenosis. Mild left greater than right L4 foraminal narrowing. L5-S1: Broad-based left eccentric disc bulge closely approximates and/or contacts the exiting left L5 nerve root (series 6, image 35). Moderate bilateral facet arthrosis. No significant spinal stenosis. Mild left L5 foraminal narrowing without impingement. Right foramen remains patent. IMPRESSION: 1. Broad-based left foraminal to extraforaminal disc protrusions at L3-4 and L4-5, contacting and potentially irritating the exiting left L3 and L4 nerve roots respectively. 2. Broad-based left eccentric disc bulge at L5-S1, closely approximating and/or potentially irritating the exiting left L5 nerve root. 3. Advanced bilateral facet arthrosis at L4-5 with associated 4 mm anterolisthesis, mildly progressed as compared to 2019. 4. No other acute abnormality within the lumbar spine. No findings to explain patient's right lower extremity symptoms identified. Aortic Atherosclerosis (ICD10-I70.0). Electronically Signed   By: Jeannine Boga M.D.   On: 08/29/2021 03:39   CARDIAC CATHETERIZATION  Result Date: 08/21/2021 CONCLUSIONS: Bilateral  iliofemoral obstruction preventing catheterization.  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 Assumed patency of the left internal mammary over the last 2-3 heart catheterizations of the been performed. Prior hemodialysis access in the left arm preventing access. Prior coronary angiography over the past 2 years has been from right radial with native right coronary, left coronary, and saphenous vein bypass graft angiography and PCI performed from that approach. RECOMMENDATIONS: Medical therapy for coronary disease.  Elevated troponin in the setting of COVID-pneumonia given her underlying coronary disease could be ascribed to demand ischemia. The graft to the diagonal contains overlapping and it x2 layers of stents with distal anastomosis ISR that was recently dilated in July.  This could be the culprit for ongoing angina.  It seems unreasonable to continue to dilate in this territory without a long-term solution. Would not perform coronary angiography again in the future unless the patient is having clear-cut ACS/STEMI.  If in that situation the only recourse would be to use right radial approach as vascular access. Discussed with Dr. Stanford Breed who will try to uptitrate the patient's  medical regimen. Discussed in detail with the patient the limited options that we have for her coronary subset.   DG CHEST PORT 1 VIEW  Result Date: 08/14/2021 CLINICAL DATA:  COVID positive. EXAM: PORTABLE CHEST 1 VIEW COMPARISON:  08/12/2021 FINDINGS: Postoperative changes in the mediastinum. Mild cardiac enlargement. Patchy perihilar infiltrates in both lungs may represent edema or multifocal pneumonia. No pleural effusions. No pneumothorax. Mediastinal contours appear intact. IMPRESSION: Cardiac enlargement with patchy perihilar infiltrates in both lungs, possibly edema or multifocal pneumonia. Electronically Signed   By: Lucienne Capers M.D.   On:  08/14/2021 19:02   DG CHEST PORT 1 VIEW  Result Date: 08/12/2021 CLINICAL DATA:  PICC line placement. EXAM: PORTABLE CHEST 1 VIEW COMPARISON:  Radiographs 08/11/2021, 08/09/2021 and 08/07/2021. CTA 08/05/2021. FINDINGS: 1332 hours. Left arm PICC projects over the left perihilar region, which in correlation with recent CTA is at the level of a persistent left-sided SVC. Stable mild cardiomegaly post median sternotomy and CABG. The diffuse bilateral pulmonary opacities demonstrate further partial clearing. There is no confluent airspace opacity, pneumothorax or significant pleural effusion. The bones appear unchanged. IMPRESSION: 1. Tip of the left arm PICC projects over the mid aspect of a persistent left-sided SVC seen on prior CT. 2. Improving bilateral airspace opacities consistent with improving COVID-19 infection. Electronically Signed   By: Richardean Sale M.D.   On: 08/12/2021 14:07   DG CHEST PORT 1 VIEW  Result Date: 08/11/2021 CLINICAL DATA:  Respiratory failure EXAM: PORTABLE CHEST 1 VIEW COMPARISON:  08/09/2021 FINDINGS: Multifocal patchy opacities with relative sparing of the left upper lobe, reflecting multifocal pneumonia, grossly unchanged. No pleural effusion or pneumothorax. Mild cardiomegaly.  Postsurgical changes related to prior CABG. Median sternotomy. IMPRESSION: Multifocal pneumonia, grossly unchanged. Electronically Signed   By: Julian Hy M.D.   On: 08/11/2021 06:57   DG CHEST PORT 1 VIEW  Result Date: 08/09/2021 CLINICAL DATA:  MRSA pneumonia EXAM: PORTABLE CHEST 1 VIEW COMPARISON:  10/08/2020 FINDINGS: Changes of CABG. Cardiomegaly. Severe diffuse bilateral airspace disease, worsening since prior study. No effusions. No acute bony abnormality. IMPRESSION: Severe diffuse bilateral airspace disease, worsening since prior study. Electronically Signed   By: Rolm Baptise M.D.   On: 08/09/2021 09:21   DG CHEST PORT 1 VIEW  Result Date: 08/07/2021 CLINICAL DATA:   Shortness of breath, COVID-19 positivity, initial encounter EXAM: PORTABLE CHEST 1 VIEW COMPARISON:  08/05/2021 FINDINGS: Cardiac shadow is stable. Lungs are well aerated bilaterally. Patchy airspace opacity is noted slightly improved when compare with the prior exam. No new focal abnormality is noted. IMPRESSION: Patchy airspace opacities improved in the interval from the prior study. Electronically Signed   By: Inez Catalina M.D.   On: 08/07/2021 22:35   DG CHEST PORT 1 VIEW  Result Date: 08/05/2021 CLINICAL DATA:  Weakness and shortness of breath. EXAM: PORTABLE CHEST 1 VIEW COMPARISON:  08/04/2021 FINDINGS: 0804 hours. Low volumes. The cardio pericardial silhouette is enlarged. Patchy bilateral airspace disease is progressive in the interval. No pleural effusion. The visualized bony structures of the thorax show no acute abnormality. Telemetry leads overlie the chest. IMPRESSION: Patchy bilateral airspace disease, progressive in the interval. Electronically Signed   By: Misty Stanley M.D.   On: 08/05/2021 08:31   DG Chest Portable 1 View  Result Date: 08/04/2021 CLINICAL DATA:  COVID positive with generalized weakness. EXAM: PORTABLE CHEST 1 VIEW COMPARISON:  None. FINDINGS: Multiple sternal wires and vascular clips are seen. Mild, ill-defined areas of atelectasis and/or infiltrate are  seen throughout the right lung and periphery of the left lung base. There is no evidence of a pleural effusion or pneumothorax. The cardiac silhouette is mildly enlarged and unchanged in size. The visualized skeletal structures are unremarkable. IMPRESSION: 1. Evidence of prior median sternotomy/CABG. 2. Ill-defined areas of bilateral atelectasis and/or infiltrate. Electronically Signed   By: Virgina Norfolk M.D.   On: 08/04/2021 03:57   VAS [place] ABI WITH/WO TBI  Result Date: 08/30/2021  LOWER EXTREMITY DOPPLER STUDY Patient Name:  Alaney Witter  Date of Exam:   08/30/2021 Medical Rec #: [id number]            Accession #:    [id number] Date of Birth: 19-Jan-1956           Patient Gender: F Patient Age:   2 years Exam Location:  [facility] Procedure:      VAS [place] ABI WITH/WO TBI Referring Phys: Alysia Penna --------------------------------------------------------------------------------  Indications: Rest pain, specifically around the shin area. Pperipheral artery              disease, (cath attempted 08/18/2021- Bilateral iliofemoral              obstruction) preventing catheterization. 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 High Risk         Hypertension, hyperlipidemia, Diabetes, coronary artery Factors:          disease. Other Factors: Recent Covid pneumonia, NSTEMI, history of renal transplant. CABG                1994.  Vascular Interventions: No prior. Comparison Study: Prior LEA done 08/28/13 indicating possible inflow disease on                   the right suggested by common femoral waveforms and severe                   bilateral common femoral artery stenosis. Performing Technologist: Sharion Dove RVS  Examination Guidelines: A complete evaluation includes at minimum, Doppler waveform signals and systolic blood pressure reading at the level of bilateral brachial, anterior tibial, and posterior tibial arteries, when vessel segments are accessible. Bilateral testing is considered an integral part of a complete examination. Photoelectric Plethysmograph (PPG) waveforms and toe systolic pressure readings are included as required and additional duplex testing as needed. Limited examinations for reoccurring indications may be performed as noted.  ABI Findings: +---------+------------------+-----+-------------------+--------+  Right     Rt Pressure (mmHg) Index Waveform            Comment   +---------+------------------+-----+-------------------+--------+  Brachial  115                       multiphasic                   +---------+------------------+-----+-------------------+--------+  PTA       87                 0.76  dampened monophasic           +---------+------------------+-----+-------------------+--------+  DP        22                 0.19  dampened monophasic           +---------+------------------+-----+-------------------+--------+  Great Toe 31                 0.27                                +---------+------------------+-----+-------------------+--------+ +---------+------------------+-----+----------+--------------------+  Left      Lt Pressure (mmHg) Index Waveform   Comment               +---------+------------------+-----+----------+--------------------+  Brachial                                      restricted (fistula)  +---------+------------------+-----+----------+--------------------+  PTA       254                2.21  monophasic                       +---------+------------------+-----+----------+--------------------+  DP        254                2.21  monophasic                       +---------+------------------+-----+----------+--------------------+  Great Toe 73                 0.63                                   +---------+------------------+-----+----------+--------------------+ +-------+----------------+-----------+------------+------------+  ABI/TBI Today's ABI      Today's TBI Previous ABI Previous TBI  +-------+----------------+-----------+------------+------------+  Right   0.76             0.27        0.96         0.70          +-------+----------------+-----------+------------+------------+  Left    non compressible 0.63        1.04         0.71          +-------+----------------+-----------+------------+------------+ Right ABIs appear decreased compared to prior study on 08/28/13. Bilateral TBIs appear decreased compared to prior study on 08/28/13. Left ABI is now non compressible since study done 08/28/13  Summary: Right: Resting right ankle-brachial index  indicates moderate right lower extremity arterial disease. The right toe-brachial index is abnormal. ABI indicates a moderate decrease in flow by velocity, however, significantly dampened waveforms indicates a more severe level of disease. Left: Resting left ankle-brachial index indicates noncompressible left lower extremity arteries. The left toe-brachial index is abnormal.  *See table(s) above for measurements and observations.  Electronically signed by Jamelle Haring on 08/30/2021 at 3:15:06 PM.    Final    ECHOCARDIOGRAM COMPLETE  Result Date: 08/09/2021    ECHOCARDIOGRAM REPORT   Patient Name:   TALLYN HOLROYD Date of Exam: 08/09/2021 Medical Rec #:  [id number]          Height:       61.0 in Accession #:    [id number]         Weight:       205.2 lb Date of Birth:  10-28-1955          BSA:          1.910 m Patient Age:    75 years  BP:           161/81 mmHg Patient Gender: F                  HR:           58 bpm. Exam Location:  Inpatient Procedure: 2D Echo, Cardiac Doppler, Color Doppler and Intracardiac            Opacification Agent Indications:    Dyspnea  History:        Patient has prior history of Echocardiogram examinations, most                 recent 01/30/2021. Previous Myocardial Infarction and CAD, Prior                 CABG, Signs/Symptoms:Dyspnea; Risk Factors:Hypertension,                 Diabetes and Dyslipidemia. Covid positive.  Sonographer:    Merrie Roof RDCS Referring Phys: [place]  1. Left ventricular ejection fraction, by estimation, is 45 to 50%. The left ventricle has mildly decreased function. The left ventricle demonstrates regional wall motion abnormalities (see scoring diagram/findings for description). There is mild left ventricular hypertrophy. Left ventricular diastolic function could not be evaluated.  2. Right ventricular systolic function is normal. The right ventricular size is normal.  3. The mitral valve is grossly normal.  Trivial mitral valve regurgitation.  4. The aortic valve has an indeterminant number of cusps. There is moderate calcification of the aortic valve. Aortic valve regurgitation is not visualized. Aortic valve sclerosis/calcification is present, without any evidence of aortic stenosis. Comparison(s): Prior images reviewed side by side. Wall motion more similar to last study with Definity contrast on 07/12/2020, although LVEF somewhat better. FINDINGS  Left Ventricle: Left ventricular ejection fraction, by estimation, is 45 to 50%. The left ventricle has mildly decreased function. The left ventricle demonstrates regional wall motion abnormalities. Definity contrast agent was given IV to delineate the left ventricular endocardial borders. The left ventricular internal cavity size was normal in size. There is mild left ventricular hypertrophy. Left ventricular diastolic function could not be evaluated.  LV Wall Scoring: The apex is dyskinetic. The apical septal segment is hypokinetic. The entire anterior wall, entire lateral wall, anterior septum, entire inferior wall, mid inferoseptal segment, and basal inferoseptal segment are normal. Right Ventricle: The right ventricular size is normal. No increase in right ventricular wall thickness. Right ventricular systolic function is normal. Left Atrium: Left atrial size was normal in size. Right Atrium: Right atrial size was normal in size. Pericardium: There is no evidence of pericardial effusion. Mitral Valve: The mitral valve is grossly normal. Mild mitral annular calcification. Trivial mitral valve regurgitation. Tricuspid Valve: The tricuspid valve is grossly normal. Tricuspid valve regurgitation is mild. Aortic Valve: The aortic valve has an indeterminant number of cusps. There is moderate calcification of the aortic valve. Aortic valve regurgitation is not visualized. Aortic valve sclerosis/calcification is present, without any evidence of aortic stenosis. Pulmonic  Valve: The pulmonic valve was grossly normal. Pulmonic valve regurgitation is trivial. Aorta: The aortic root is normal in size and structure. IAS/Shunts: The interatrial septum was not well visualized.  LEFT VENTRICLE PLAX 2D LVIDd:         4.40 cm LVIDs:         3.30 cm LV PW:         1.00 cm LV IVS:  1.10 cm LVOT diam:     2.00 cm LVOT Area:     3.14 cm  LEFT ATRIUM         Index LA diam:    4.80 cm 2.51 cm/m   AORTA Ao Root diam: 3.00 cm  SHUNTS Systemic Diam: 2.00 cm Rozann Lesches MD Electronically signed by Rozann Lesches MD Signature Date/Time: 08/09/2021/3:43:40 PM    Final    VAS [place] LOWER EXTREMITY ARTERIAL DUPLEX  Result Date: 08/30/2021 LOWER EXTREMITY ARTERIAL DUPLEX STUDY Patient Name:  MADYSIN CRISP  Date of Exam:   08/30/2021 Medical Rec #: [id number]           Accession #:    [id number] Date of Birth: 1955-12-01           Patient Gender: F Patient Age:   74 years Exam Location:  [facility] Procedure:      VAS [place] LOWER EXTREMITY ARTERIAL DUPLEX Referring Phys: Alysia Penna --------------------------------------------------------------------------------  Indications: Rest pain,specifically at shin area. 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 High Risk Factors: Hypertension, hyperlipidemia, Diabetes, prior MI, coronary                    artery disease. Other Factors: Kidney transplant, CABG, recent Covid pneumonia.  Vascular Interventions: No prior intervention. Current ABI:            R:0.76 L:non compressible Comparison Study: Prior LEA done 08/28/13 indicating possible inflow disease on                   the right suggested by right CFA waveforms. Severe bilateral                   CFA stenosis. Performing Technologist: Sharion Dove RVS  Examination Guidelines: A complete evaluation  includes B-mode imaging, spectral Doppler, color Doppler, and power Doppler as needed of all accessible portions of each vessel. Bilateral testing is considered an integral part of a complete examination. Limited examinations for reoccurring indications may be performed as noted.  +-----------+--------+-----+--------+----------+---------------------+  RIGHT       PSV cm/s Ratio Stenosis Waveform   Comments               +-----------+--------+-----+--------+----------+---------------------+  CFA Prox    52                      monophasic                        +-----------+--------+-----+--------+----------+---------------------+  DFA         22                      monophasic                        +-----------+--------+-----+--------+----------+---------------------+  SFA Prox    43                      monophasic collateral flow noted  +-----------+--------+-----+--------+----------+---------------------+  SFA Mid     35  monophasic                        +-----------+--------+-----+--------+----------+---------------------+  SFA Distal  32                      monophasic                        +-----------+--------+-----+--------+----------+---------------------+  POP Prox    22                      monophasic                        +-----------+--------+-----+--------+----------+---------------------+  POP Distal  32                      monophasic                        +-----------+--------+-----+--------+----------+---------------------+  ATA Prox    21                      monophasic                        +-----------+--------+-----+--------+----------+---------------------+  ATA Mid     25                      monophasic                        +-----------+--------+-----+--------+----------+---------------------+  ATA Distal  30                      monophasic                        +-----------+--------+-----+--------+----------+---------------------+  PTA Prox    39                       monophasic                        +-----------+--------+-----+--------+----------+---------------------+  PTA Mid     39                      monophasic                        +-----------+--------+-----+--------+----------+---------------------+  PTA Distal  53                      monophasic                        +-----------+--------+-----+--------+----------+---------------------+  PERO Prox                           absent                            +-----------+--------+-----+--------+----------+---------------------+  PERO Mid                            absent                            +-----------+--------+-----+--------+----------+---------------------+  PERO Distal                         absent                            +-----------+--------+-----+--------+----------+---------------------+   +----------+--------+-----+--------+----------+--------+  LEFT       PSV cm/s Ratio Stenosis Waveform   Comments  +----------+--------+-----+--------+----------+--------+  CFA Prox   52                      monophasic           +----------+--------+-----+--------+----------+--------+  DFA        45                      monophasic           +----------+--------+-----+--------+----------+--------+  SFA Prox   57                      monophasic           +----------+--------+-----+--------+----------+--------+  SFA Mid    46                      monophasic           +----------+--------+-----+--------+----------+--------+  SFA Distal 32                      monophasic           +----------+--------+-----+--------+----------+--------+  POP Prox   31                      monophasic           +----------+--------+-----+--------+----------+--------+  POP Distal 24                      monophasic           +----------+--------+-----+--------+----------+--------+  ATA Prox   21                      monophasic           +----------+--------+-----+--------+----------+--------+  ATA Mid    25                      monophasic            +----------+--------+-----+--------+----------+--------+  ATA Distal 14                      monophasic           +----------+--------+-----+--------+----------+--------+  PTA Prox   30                      monophasic           +----------+--------+-----+--------+----------+--------+  PTA Mid    28                      monophasic           +----------+--------+-----+--------+----------+--------+  PTA Distal 25                      monophasic           +----------+--------+-----+--------+----------+--------+  Summary: Right: Atherosclerotic plaque noted throughout. Monophasic waveforms suggestive of iliofemoral disease. Some collateral flow noted  at the mid SFA. Left: Atherosclerotic plaque noted throughout. Monophasic waveforms suggestive of iliofemoral disease.  See table(s) above for measurements and observations. Electronically signed by Jamelle Haring on 08/30/2021 at 3:16:10 PM.    Final    VAS [place] LOWER EXTREMITY VENOUS (DVT)  Result Date: 08/28/2021  Lower Venous DVT Study Patient Name:  LIORA MYLES  Date of Exam:   08/28/2021 Medical Rec #: [id number]           Accession #:    [id number] Date of Birth: 04/20/1956           Patient Gender: F Patient Age:   41 years Exam Location:  [facility] Procedure:      VAS [place] LOWER EXTREMITY VENOUS (DVT) Referring Phys: Alysia Penna --------------------------------------------------------------------------------  Indications: Pain.  Limitations: Body habitus and poor ultrasound/tissue interface. Comparison Study: 08/09/2021- negative bilateral lower extremity venous duplex Performing Technologist: Maudry Mayhew MHA, RDMS, RVT, RDCS  Examination Guidelines: A complete evaluation includes B-mode imaging, spectral Doppler, color Doppler, and power Doppler as needed of all accessible portions of each vessel. Bilateral testing is considered an integral part of a complete examination. Limited examinations for reoccurring indications may be  performed as noted. The reflux portion of the exam is performed with the patient in reverse Trendelenburg.  +---------+---------------+---------+-----------+----------+--------------+  RIGHT     Compressibility Phasicity Spontaneity Properties Thrombus Aging  +---------+---------------+---------+-----------+----------+--------------+  CFV       Full            Yes       Yes                                    +---------+---------------+---------+-----------+----------+--------------+  SFJ       Full                                                             +---------+---------------+---------+-----------+----------+--------------+  FV Prox   Full                                                             +---------+---------------+---------+-----------+----------+--------------+  FV Mid    Full                                                             +---------+---------------+---------+-----------+----------+--------------+  FV Distal Full                                                             +---------+---------------+---------+-----------+----------+--------------+  PFV       Full                                                             +---------+---------------+---------+-----------+----------+--------------+  POP       Full            Yes       Yes                                    +---------+---------------+---------+-----------+----------+--------------+  PTV       Full                                                             +---------+---------------+---------+-----------+----------+--------------+  PERO      Full                                                             +---------+---------------+---------+-----------+----------+--------------+   Right Technical Findings: Not visualized segments include limited visualization PTV and peroneal veins.  +---------+---------------+---------+-----------+----------+--------------+  LEFT       Compressibility Phasicity Spontaneity Properties Thrombus Aging  +---------+---------------+---------+-----------+----------+--------------+  CFV       Full            Yes       Yes                                    +---------+---------------+---------+-----------+----------+--------------+  SFJ       Full                                                             +---------+---------------+---------+-----------+----------+--------------+  FV Prox   Full                                                             +---------+---------------+---------+-----------+----------+--------------+  FV Mid    Full                                                             +---------+---------------+---------+-----------+----------+--------------+  FV Distal Full                                                             +---------+---------------+---------+-----------+----------+--------------+  PFV       Full                                                             +---------+---------------+---------+-----------+----------+--------------+  POP       Full            Yes       Yes                                    +---------+---------------+---------+-----------+----------+--------------+  PTV       Full                                                             +---------+---------------+---------+-----------+----------+--------------+  PERO      Full                                                             +---------+---------------+---------+-----------+----------+--------------+   Left Technical Findings: Not visualized segments include limited visualization PTV and peroneal veins.   Summary: RIGHT: - There is no evidence of deep vein thrombosis in the lower extremity. However, portions of this examination were limited- see technologist comments above.  - No cystic structure found in the popliteal fossa.  LEFT: - There is no evidence of deep vein thrombosis in the lower extremity. However, portions of this  examination were limited- see technologist comments above.  - No cystic structure found in the popliteal fossa.  *See table(s) above for measurements and observations. Electronically signed by Orlie Pollen on 08/28/2021 at 3:58:46 PM.    Final    VAS [place] LOWER EXTREMITY VENOUS (DVT)  Result Date: 08/10/2021  Lower Venous DVT Study Patient Name:  FREDERICK KLINGER  Date of Exam:   08/09/2021 Medical Rec #: [id number]           Accession #:    [id number] Date of Birth: Sep 27, 1955           Patient Gender: F Patient Age:   63 years Exam Location:  [facility] Procedure:      VAS [place] LOWER EXTREMITY VENOUS (DVT) Referring Phys: A POWELL JR --------------------------------------------------------------------------------  Indications: Covid, D-Dimer.  Limitations: On high flow oxygen. Pain with compression and body habitus. Comparison Study: Prior negative bilateral LEV done 01/07/2011 Performing Technologist: Sharion Dove RVS  Examination Guidelines: A complete evaluation includes B-mode imaging, spectral Doppler, color Doppler, and power Doppler as needed of all accessible portions of each vessel. Bilateral testing is considered an integral part of a complete examination. Limited examinations for reoccurring indications may be performed as noted. The reflux portion of the exam is performed with the patient in reverse Trendelenburg.  +---------+---------------+---------+-----------+----------+--------------+  RIGHT     Compressibility Phasicity Spontaneity Properties Thrombus Aging  +---------+---------------+---------+-----------+----------+--------------+  CFV       Full            Yes       Yes                                    +---------+---------------+---------+-----------+----------+--------------+  SFJ       Full                                                             +---------+---------------+---------+-----------+----------+--------------+  FV Prox   Full                                                              +---------+---------------+---------+-----------+----------+--------------+  FV Mid    Full                                                             +---------+---------------+---------+-----------+----------+--------------+  FV Distal Full                                                             +---------+---------------+---------+-----------+----------+--------------+  PFV       Full                                                             +---------+---------------+---------+-----------+----------+--------------+  POP       Full            Yes       Yes                                    +---------+---------------+---------+-----------+----------+--------------+  PTV       Full                                                             +---------+---------------+---------+-----------+----------+--------------+  PERO      Full                                                             +---------+---------------+---------+-----------+----------+--------------+   +---------+---------------+---------+-----------+----------+--------------+  LEFT      Compressibility Phasicity Spontaneity Properties Thrombus Aging  +---------+---------------+---------+-----------+----------+--------------+  CFV       Full            Yes       Yes                                    +---------+---------------+---------+-----------+----------+--------------+  SFJ       Full                                                             +---------+---------------+---------+-----------+----------+--------------+  FV Prox   Full                                                             +---------+---------------+---------+-----------+----------+--------------+  FV Mid    Full                                                             +---------+---------------+---------+-----------+----------+--------------+  FV Distal Full                                                              +---------+---------------+---------+-----------+----------+--------------+  PFV       Full                                                             +---------+---------------+---------+-----------+----------+--------------+  POP       Full            Yes       Yes                                    +---------+---------------+---------+-----------+----------+--------------+  PTV       Full                                                             +---------+---------------+---------+-----------+----------+--------------+  PERO      Full                                                             +---------+---------------+---------+-----------+----------+--------------+     Summary: BILATERAL: No ultrasound evidence of deep venous thrombosis  -No evidence of popliteal cyst, bilaterally.   *See table(s) above for measurements and observations. Electronically signed by Jamelle Haring on 08/10/2021 at 3:30:07 PM.    Final    [place] EKG SITE RITE  Result Date: 08/11/2021 If Site Rite image not attached, placement could not be confirmed due to current cardiac rhythm.   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 Attempted cath 08/21/21 aborted with no  access Note indicates only cath again for acute ST elevation MI.  Angina today. 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 Caveat is patient is willing to take any risk to have vascular surgery latter this week including death  She is adamant that she wants her legs "fixed"  If medication changes help and no further angina may consider angiography and possible stenting latter in week and transition eliquis to heparin. Even percutaneous intervention would be high risk Added co morbidities besides heart are previous renal transplant and oxygen dependent COPD.     She has had right ICA stenosis 40-59% back in 2017 and this needs to be updated as well   [name] is in cath lab tomorrow and Dr Gwenlyn Found will round on patient They will need to discuss options further in am   Signed: Jenkins Rouge 09/01/2021, 7:37 PM

## 2021-09-01 NOTE — Progress Notes (Signed)
Patient with CP relieved with one NTG but recurrent with transfer to [facility]. This time it was worse with radiation to CP. Usually uses BB. Administered another NTG as well as metoprolol 12.5 mg as EKG showing rapid A fib with HR 111.

## 2021-09-01 NOTE — Progress Notes (Signed)
Patient complaining of 7 out of 10 chest pain. Vitals obtained. Nurse entered the room and patient states "I am gonna need an EKG now". Patient on the phone with sister carrying on conversation and drinking a soda. Arthor Captain, [place] about chest pain. EKG order obtained, EKG results read to [facility] and shown to [place], [place]. Pam, PA instructed to give one nitro tablet sublingually. Nitro given to patient.

## 2021-09-02 ENCOUNTER — Inpatient Hospital Stay (HOSPITAL_COMMUNITY): Payer: Medicare Other

## 2021-09-02 ENCOUNTER — Ambulatory Visit: Payer: Medicare Other | Admitting: Internal Medicine

## 2021-09-02 ENCOUNTER — Inpatient Hospital Stay (HOSPITAL_COMMUNITY)
Admission: AD | Admit: 2021-09-02 | Discharge: 2021-09-11 | DRG: 280 | Disposition: A | Discharge status: Home / Self Care | Payer: Medicare Other | Source: Other Acute Inpatient Hospital | Attending: Internal Medicine | Admitting: Internal Medicine

## 2021-09-02 DIAGNOSIS — J9611 Chronic respiratory failure with hypoxia: Secondary | ICD-10-CM | POA: Diagnosis present

## 2021-09-02 DIAGNOSIS — I214 Non-ST elevation (NSTEMI) myocardial infarction: Secondary | ICD-10-CM | POA: Diagnosis not present

## 2021-09-02 DIAGNOSIS — D638 Anemia in other chronic diseases classified elsewhere: Secondary | ICD-10-CM | POA: Diagnosis not present

## 2021-09-02 DIAGNOSIS — F32A Depression, unspecified: Secondary | ICD-10-CM | POA: Diagnosis present

## 2021-09-02 DIAGNOSIS — M79604 Pain in right leg: Secondary | ICD-10-CM | POA: Diagnosis not present

## 2021-09-02 DIAGNOSIS — Y840 Cardiac catheterization as the cause of abnormal reaction of the patient, or of later complication, without mention of misadventure at the time of the procedure: Secondary | ICD-10-CM | POA: Diagnosis not present

## 2021-09-02 DIAGNOSIS — K219 Gastro-esophageal reflux disease without esophagitis: Secondary | ICD-10-CM | POA: Diagnosis present

## 2021-09-02 DIAGNOSIS — J449 Chronic obstructive pulmonary disease, unspecified: Secondary | ICD-10-CM

## 2021-09-02 DIAGNOSIS — R5381 Other malaise: Secondary | ICD-10-CM | POA: Diagnosis not present

## 2021-09-02 DIAGNOSIS — I25110 Atherosclerotic heart disease of native coronary artery with unstable angina pectoris: Secondary | ICD-10-CM | POA: Diagnosis present

## 2021-09-02 DIAGNOSIS — I97410 Intraoperative hemorrhage and hematoma of a circulatory system organ or structure complicating a cardiac catheterization: Secondary | ICD-10-CM | POA: Diagnosis not present

## 2021-09-02 DIAGNOSIS — I5032 Chronic diastolic (congestive) heart failure: Secondary | ICD-10-CM | POA: Diagnosis present

## 2021-09-02 DIAGNOSIS — E876 Hypokalemia: Secondary | ICD-10-CM | POA: Diagnosis not present

## 2021-09-02 DIAGNOSIS — I152 Hypertension secondary to endocrine disorders: Secondary | ICD-10-CM | POA: Diagnosis present

## 2021-09-02 DIAGNOSIS — E871 Hypo-osmolality and hyponatremia: Secondary | ICD-10-CM | POA: Diagnosis present

## 2021-09-02 DIAGNOSIS — Z8616 Personal history of COVID-19: Secondary | ICD-10-CM | POA: Diagnosis not present

## 2021-09-02 DIAGNOSIS — Z951 Presence of aortocoronary bypass graft: Secondary | ICD-10-CM

## 2021-09-02 DIAGNOSIS — Z8249 Family history of ischemic heart disease and other diseases of the circulatory system: Secondary | ICD-10-CM

## 2021-09-02 DIAGNOSIS — I6523 Occlusion and stenosis of bilateral carotid arteries: Secondary | ICD-10-CM | POA: Diagnosis not present

## 2021-09-02 DIAGNOSIS — Z6838 Body mass index (BMI) 38.0-38.9, adult: Secondary | ICD-10-CM

## 2021-09-02 DIAGNOSIS — Z794 Long term (current) use of insulin: Secondary | ICD-10-CM

## 2021-09-02 DIAGNOSIS — I482 Chronic atrial fibrillation, unspecified: Secondary | ICD-10-CM | POA: Diagnosis present

## 2021-09-02 DIAGNOSIS — M069 Rheumatoid arthritis, unspecified: Secondary | ICD-10-CM | POA: Diagnosis present

## 2021-09-02 DIAGNOSIS — E119 Type 2 diabetes mellitus without complications: Secondary | ICD-10-CM | POA: Diagnosis not present

## 2021-09-02 DIAGNOSIS — E1151 Type 2 diabetes mellitus with diabetic peripheral angiopathy without gangrene: Principal | ICD-10-CM | POA: Diagnosis present

## 2021-09-02 DIAGNOSIS — I2510 Atherosclerotic heart disease of native coronary artery without angina pectoris: Secondary | ICD-10-CM | POA: Diagnosis not present

## 2021-09-02 DIAGNOSIS — E1169 Type 2 diabetes mellitus with other specified complication: Secondary | ICD-10-CM | POA: Diagnosis present

## 2021-09-02 DIAGNOSIS — M47816 Spondylosis without myelopathy or radiculopathy, lumbar region: Secondary | ICD-10-CM

## 2021-09-02 DIAGNOSIS — D84821 Immunodeficiency due to drugs: Secondary | ICD-10-CM | POA: Diagnosis present

## 2021-09-02 DIAGNOSIS — R0602 Shortness of breath: Secondary | ICD-10-CM | POA: Diagnosis not present

## 2021-09-02 DIAGNOSIS — E785 Hyperlipidemia, unspecified: Secondary | ICD-10-CM | POA: Diagnosis not present

## 2021-09-02 DIAGNOSIS — I70263 Atherosclerosis of native arteries of extremities with gangrene, bilateral legs: Secondary | ICD-10-CM | POA: Diagnosis not present

## 2021-09-02 DIAGNOSIS — I110 Hypertensive heart disease with heart failure: Secondary | ICD-10-CM | POA: Diagnosis present

## 2021-09-02 DIAGNOSIS — E1142 Type 2 diabetes mellitus with diabetic polyneuropathy: Secondary | ICD-10-CM | POA: Diagnosis not present

## 2021-09-02 DIAGNOSIS — Z95828 Presence of other vascular implants and grafts: Secondary | ICD-10-CM

## 2021-09-02 DIAGNOSIS — E11649 Type 2 diabetes mellitus with hypoglycemia without coma: Secondary | ICD-10-CM | POA: Diagnosis not present

## 2021-09-02 DIAGNOSIS — I200 Unstable angina: Secondary | ICD-10-CM | POA: Diagnosis not present

## 2021-09-02 DIAGNOSIS — I4811 Longstanding persistent atrial fibrillation: Secondary | ICD-10-CM | POA: Diagnosis not present

## 2021-09-02 DIAGNOSIS — Z940 Kidney transplant status: Secondary | ICD-10-CM | POA: Diagnosis not present

## 2021-09-02 DIAGNOSIS — I739 Peripheral vascular disease, unspecified: Secondary | ICD-10-CM | POA: Diagnosis not present

## 2021-09-02 DIAGNOSIS — I160 Hypertensive urgency: Secondary | ICD-10-CM

## 2021-09-02 DIAGNOSIS — Z7952 Long term (current) use of systemic steroids: Secondary | ICD-10-CM

## 2021-09-02 DIAGNOSIS — Z8614 Personal history of Methicillin resistant Staphylococcus aureus infection: Secondary | ICD-10-CM

## 2021-09-02 DIAGNOSIS — I5043 Acute on chronic combined systolic (congestive) and diastolic (congestive) heart failure: Secondary | ICD-10-CM | POA: Diagnosis not present

## 2021-09-02 DIAGNOSIS — Z79899 Other long term (current) drug therapy: Secondary | ICD-10-CM

## 2021-09-02 DIAGNOSIS — Z87442 Personal history of urinary calculi: Secondary | ICD-10-CM

## 2021-09-02 DIAGNOSIS — E1165 Type 2 diabetes mellitus with hyperglycemia: Secondary | ICD-10-CM | POA: Diagnosis present

## 2021-09-02 DIAGNOSIS — J810 Acute pulmonary edema: Secondary | ICD-10-CM | POA: Diagnosis not present

## 2021-09-02 DIAGNOSIS — H538 Other visual disturbances: Secondary | ICD-10-CM | POA: Diagnosis present

## 2021-09-02 DIAGNOSIS — I7092 Chronic total occlusion of artery of the extremities: Secondary | ICD-10-CM | POA: Diagnosis not present

## 2021-09-02 DIAGNOSIS — J440 Chronic obstructive pulmonary disease with acute lower respiratory infection: Secondary | ICD-10-CM | POA: Diagnosis not present

## 2021-09-02 DIAGNOSIS — I70221 Atherosclerosis of native arteries of extremities with rest pain, right leg: Secondary | ICD-10-CM | POA: Diagnosis not present

## 2021-09-02 DIAGNOSIS — Z9049 Acquired absence of other specified parts of digestive tract: Secondary | ICD-10-CM

## 2021-09-02 DIAGNOSIS — I70229 Atherosclerosis of native arteries of extremities with rest pain, unspecified extremity: Secondary | ICD-10-CM | POA: Diagnosis present

## 2021-09-02 DIAGNOSIS — Z9861 Coronary angioplasty status: Secondary | ICD-10-CM | POA: Diagnosis not present

## 2021-09-02 DIAGNOSIS — Z7901 Long term (current) use of anticoagulants: Secondary | ICD-10-CM

## 2021-09-02 DIAGNOSIS — M549 Dorsalgia, unspecified: Secondary | ICD-10-CM | POA: Diagnosis present

## 2021-09-02 DIAGNOSIS — I69398 Other sequelae of cerebral infarction: Secondary | ICD-10-CM

## 2021-09-02 DIAGNOSIS — E669 Obesity, unspecified: Secondary | ICD-10-CM | POA: Diagnosis present

## 2021-09-02 DIAGNOSIS — Z800 Family history of malignant neoplasm of digestive organs: Secondary | ICD-10-CM

## 2021-09-02 DIAGNOSIS — Z66 Do not resuscitate: Secondary | ICD-10-CM | POA: Diagnosis not present

## 2021-09-02 DIAGNOSIS — G8929 Other chronic pain: Secondary | ICD-10-CM | POA: Diagnosis present

## 2021-09-02 DIAGNOSIS — I5042 Chronic combined systolic (congestive) and diastolic (congestive) heart failure: Secondary | ICD-10-CM | POA: Diagnosis present

## 2021-09-02 DIAGNOSIS — R0981 Nasal congestion: Secondary | ICD-10-CM | POA: Diagnosis not present

## 2021-09-02 DIAGNOSIS — Z515 Encounter for palliative care: Secondary | ICD-10-CM | POA: Diagnosis not present

## 2021-09-02 DIAGNOSIS — I779 Disorder of arteries and arterioles, unspecified: Secondary | ICD-10-CM | POA: Diagnosis not present

## 2021-09-02 DIAGNOSIS — F419 Anxiety disorder, unspecified: Secondary | ICD-10-CM | POA: Diagnosis present

## 2021-09-02 DIAGNOSIS — I4892 Unspecified atrial flutter: Secondary | ICD-10-CM | POA: Diagnosis present

## 2021-09-02 DIAGNOSIS — I4891 Unspecified atrial fibrillation: Secondary | ICD-10-CM | POA: Diagnosis not present

## 2021-09-02 DIAGNOSIS — M797 Fibromyalgia: Secondary | ICD-10-CM | POA: Diagnosis present

## 2021-09-02 DIAGNOSIS — Z9884 Bariatric surgery status: Secondary | ICD-10-CM

## 2021-09-02 DIAGNOSIS — I480 Paroxysmal atrial fibrillation: Secondary | ICD-10-CM | POA: Diagnosis present

## 2021-09-02 DIAGNOSIS — Z7902 Long term (current) use of antithrombotics/antiplatelets: Secondary | ICD-10-CM

## 2021-09-02 DIAGNOSIS — I70211 Atherosclerosis of native arteries of extremities with intermittent claudication, right leg: Secondary | ICD-10-CM | POA: Diagnosis present

## 2021-09-02 DIAGNOSIS — I252 Old myocardial infarction: Secondary | ICD-10-CM

## 2021-09-02 DIAGNOSIS — J438 Other emphysema: Secondary | ICD-10-CM | POA: Diagnosis not present

## 2021-09-02 DIAGNOSIS — Z955 Presence of coronary angioplasty implant and graft: Secondary | ICD-10-CM

## 2021-09-02 DIAGNOSIS — Z9981 Dependence on supplemental oxygen: Secondary | ICD-10-CM

## 2021-09-02 LAB — GLUCOSE, CAPILLARY
Glucose-Capillary: 243 mg/dL — ABNORMAL HIGH (ref 70–99)
Glucose-Capillary: 255 mg/dL — ABNORMAL HIGH (ref 70–99)
Glucose-Capillary: 227 mg/dL — ABNORMAL HIGH (ref 70–99)
Glucose-Capillary: 63 mg/dL — ABNORMAL LOW (ref 70–99)
Glucose-Capillary: 68 mg/dL — ABNORMAL LOW (ref 70–99)
Glucose-Capillary: 84 mg/dL (ref 70–99)

## 2021-09-02 LAB — CBC
HCT: 29.7 % — ABNORMAL LOW (ref 36.0–46.0)
Hemoglobin: 9.2 g/dL — ABNORMAL LOW (ref 12.0–15.0)
MCH: 32.2 pg (ref 26.0–34.0)
MCHC: 31 g/dL (ref 30.0–36.0)
MCV: 103.8 fL — ABNORMAL HIGH (ref 80.0–100.0)
Platelets: 173 10*3/uL (ref 150–400)
RBC: 2.86 MIL/uL — ABNORMAL LOW (ref 3.87–5.11)
RDW: 16.5 % — ABNORMAL HIGH (ref 11.5–15.5)
WBC: 7 10*3/uL (ref 4.0–10.5)
nRBC: 0.6 % — ABNORMAL HIGH (ref 0.0–0.2)

## 2021-09-02 LAB — BRAIN NATRIURETIC PEPTIDE: B Natriuretic Peptide: 539.6 pg/mL — ABNORMAL HIGH (ref 0.0–100.0)

## 2021-09-02 LAB — TROPONIN I (HIGH SENSITIVITY): Troponin I (High Sensitivity): 66 ng/L — ABNORMAL HIGH (ref <18)

## 2021-09-02 MED ORDER — AZATHIOPRINE 50 MG PO TABS
125.0000 mg | ORAL_TABLET | Freq: Every day | ORAL | Status: DC
  Administered 2021-09-03 – 2021-09-11 (×9): 125 mg via ORAL
  Filled 2021-09-02 (×9): qty 3

## 2021-09-02 MED ORDER — MONTELUKAST SODIUM 10 MG PO TABS
10.0000 mg | ORAL_TABLET | Freq: Every day | ORAL | Status: DC
  Administered 2021-09-02 – 2021-09-10 (×9): 10 mg via ORAL
  Filled 2021-09-02 (×9): qty 1

## 2021-09-02 MED ORDER — PREDNISONE 5 MG PO TABS: 5.0000 mg | ORAL_TABLET | Freq: Every day | ORAL | Status: DC

## 2021-09-02 MED ORDER — PREDNISONE 5 MG PO TABS
5.0000 mg | ORAL_TABLET | Freq: Every day | ORAL | Status: DC
  Administered 2021-09-03 – 2021-09-11 (×9): 5 mg via ORAL
  Filled 2021-09-02 (×9): qty 1

## 2021-09-02 MED ORDER — HYDRALAZINE HCL 25 MG PO TABS: 25.0000 mg | ORAL_TABLET | Freq: Three times a day (TID) | ORAL | Status: DC | PRN

## 2021-09-02 MED ORDER — FLUTICASONE PROPIONATE 50 MCG/ACT NA SUSP
2.0000 | Freq: Every day | NASAL | Status: DC
  Administered 2021-09-03 – 2021-09-11 (×9): 2 via NASAL
  Filled 2021-09-02: qty 16

## 2021-09-02 MED ORDER — BISACODYL 10 MG RE SUPP: 10.0000 mg | Freq: Every day | RECTAL | Status: DC | PRN

## 2021-09-02 MED ORDER — LORAZEPAM 0.5 MG PO TABS
0.5000 mg | ORAL_TABLET | Freq: Two times a day (BID) | ORAL | Status: DC | PRN
  Administered 2021-09-02 – 2021-09-11 (×18): 0.5 mg via ORAL
  Filled 2021-09-02 (×18): qty 1

## 2021-09-02 MED ORDER — OXYCODONE HCL 5 MG PO TABS
10.0000 mg | ORAL_TABLET | Freq: Four times a day (QID) | ORAL | Status: DC | PRN
  Administered 2021-09-02 – 2021-09-11 (×21): 10 mg via ORAL
  Filled 2021-09-02 (×21): qty 2

## 2021-09-02 MED ORDER — HYDROCERIN EX CREA
TOPICAL_CREAM | Freq: Two times a day (BID) | CUTANEOUS | Status: DC
  Administered 2021-09-03 – 2021-09-08 (×4): 1 via TOPICAL
  Filled 2021-09-02 (×2): qty 113

## 2021-09-02 MED ORDER — IPRATROPIUM-ALBUTEROL 0.5-2.5 (3) MG/3ML IN SOLN
3.0000 mL | RESPIRATORY_TRACT | Status: DC | PRN
  Administered 2021-09-02 – 2021-09-07 (×2): 3 mL via RESPIRATORY_TRACT
  Filled 2021-09-02 (×2): qty 3

## 2021-09-02 MED ORDER — POLYETHYLENE GLYCOL 3350 17 G PO PACK
17.0000 g | PACK | Freq: Every day | ORAL | Status: DC | PRN
  Administered 2021-09-08: 17 g via ORAL
  Filled 2021-09-02: qty 1

## 2021-09-02 MED ORDER — MORPHINE SULFATE (PF) 2 MG/ML IV SOLN
0.5000 mg | INTRAVENOUS | Status: DC | PRN
  Administered 2021-09-09: 04:00:00 0.5 mg via INTRAVENOUS
  Filled 2021-09-02: qty 1

## 2021-09-02 MED ORDER — LINAGLIPTIN 5 MG PO TABS
5.0000 mg | ORAL_TABLET | Freq: Every day | ORAL | Status: DC
  Administered 2021-09-03 – 2021-09-11 (×9): 5 mg via ORAL
  Filled 2021-09-02 (×9): qty 1

## 2021-09-02 MED ORDER — SODIUM CHLORIDE 0.9% FLUSH
10.0000 mL | Freq: Two times a day (BID) | INTRAVENOUS | Status: DC
  Administered 2021-09-02 – 2021-09-09 (×13): 10 mL
  Administered 2021-09-10: 09:00:00 20 mL
  Administered 2021-09-10: 22:00:00 10 mL

## 2021-09-02 MED ORDER — INSULIN ASPART 100 UNIT/ML IJ SOLN
5.0000 [IU] | Freq: Three times a day (TID) | INTRAMUSCULAR | Status: DC
  Administered 2021-09-03 – 2021-09-06 (×8): 5 [IU] via SUBCUTANEOUS

## 2021-09-02 MED ORDER — PROCHLORPERAZINE 25 MG RE SUPP
12.5000 mg | Freq: Four times a day (QID) | RECTAL | Status: DC | PRN
Start: 2021-09-02 — End: 2021-09-11
  Filled 2021-09-02: qty 1

## 2021-09-02 MED ORDER — METFORMIN HCL 500 MG PO TABS: 1000.0000 mg | ORAL_TABLET | Freq: Every day | ORAL | Status: DC

## 2021-09-02 MED ORDER — DICLOFENAC SODIUM 1 % EX GEL
2.0000 g | Freq: Three times a day (TID) | CUTANEOUS | Status: DC
  Administered 2021-09-02 – 2021-09-11 (×25): 2 g via TOPICAL
  Filled 2021-09-02: qty 100

## 2021-09-02 MED ORDER — FLEET ENEMA 7-19 GM/118ML RE ENEM: 1.0000 | ENEMA | Freq: Once | RECTAL | Status: DC | PRN

## 2021-09-02 MED ORDER — CLOPIDOGREL BISULFATE 75 MG PO TABS
75.0000 mg | ORAL_TABLET | Freq: Every day | ORAL | Status: DC
  Administered 2021-09-03 – 2021-09-11 (×9): 75 mg via ORAL
  Filled 2021-09-02 (×9): qty 1

## 2021-09-02 MED ORDER — SENNOSIDES-DOCUSATE SODIUM 8.6-50 MG PO TABS
2.0000 | ORAL_TABLET | Freq: Two times a day (BID) | ORAL | Status: DC
  Administered 2021-09-03 – 2021-09-04 (×2): 2 via ORAL
  Filled 2021-09-02 (×6): qty 2

## 2021-09-02 MED ORDER — DIPHENHYDRAMINE HCL 12.5 MG/5ML PO ELIX
12.5000 mg | ORAL_SOLUTION | Freq: Four times a day (QID) | ORAL | Status: DC | PRN
  Filled 2021-09-02 (×2): qty 10

## 2021-09-02 MED ORDER — MAGIC MOUTHWASH
5.0000 mL | Freq: Four times a day (QID) | ORAL | Status: DC
  Administered 2021-09-02 – 2021-09-11 (×33): 5 mL via ORAL
  Filled 2021-09-02 (×40): qty 5

## 2021-09-02 MED ORDER — ENSURE MAX PROTEIN PO LIQD
11.0000 [oz_av] | Freq: Three times a day (TID) | ORAL | Status: DC
  Administered 2021-09-02 – 2021-09-11 (×21): 11 [oz_av] via ORAL
  Filled 2021-09-02 (×31): qty 330

## 2021-09-02 MED ORDER — LAMOTRIGINE 25 MG PO TABS
25.0000 mg | ORAL_TABLET | Freq: Every day | ORAL | Status: DC
  Administered 2021-09-02 – 2021-09-10 (×9): 25 mg via ORAL
  Filled 2021-09-02 (×11): qty 1

## 2021-09-02 MED ORDER — LORAZEPAM 0.5 MG PO TABS
0.5000 mg | ORAL_TABLET | Freq: Once | ORAL | Status: AC
  Administered 2021-09-02: 0.5 mg via ORAL
  Filled 2021-09-02: qty 1

## 2021-09-02 MED ORDER — INSULIN ASPART 100 UNIT/ML IJ SOLN
0.0000 [IU] | Freq: Three times a day (TID) | INTRAMUSCULAR | Status: DC
  Administered 2021-09-02: 3 [IU] via SUBCUTANEOUS
  Administered 2021-09-03 (×2): 2 [IU] via SUBCUTANEOUS
  Administered 2021-09-03: 7 [IU] via SUBCUTANEOUS
  Administered 2021-09-04: 9 [IU] via SUBCUTANEOUS
  Administered 2021-09-04: 5 [IU] via SUBCUTANEOUS
  Administered 2021-09-04: 3 [IU] via SUBCUTANEOUS
  Administered 2021-09-05: 5 [IU] via SUBCUTANEOUS
  Administered 2021-09-05: 2 [IU] via SUBCUTANEOUS
  Administered 2021-09-05: 3 [IU] via SUBCUTANEOUS
  Administered 2021-09-06: 7 [IU] via SUBCUTANEOUS
  Administered 2021-09-06: 3 [IU] via SUBCUTANEOUS
  Administered 2021-09-06: 7 [IU] via SUBCUTANEOUS
  Administered 2021-09-07: 5 [IU] via SUBCUTANEOUS
  Administered 2021-09-07 – 2021-09-08 (×3): 3 [IU] via SUBCUTANEOUS
  Administered 2021-09-08: 7 [IU] via SUBCUTANEOUS
  Administered 2021-09-08: 3 [IU] via SUBCUTANEOUS

## 2021-09-02 MED ORDER — MENTHOL 3 MG MT LOZG: 1.0000 | LOZENGE | OROMUCOSAL | Status: DC | PRN

## 2021-09-02 MED ORDER — ISOSORBIDE DINITRATE 10 MG PO TABS
10.0000 mg | ORAL_TABLET | Freq: Two times a day (BID) | ORAL | Status: DC
  Administered 2021-09-02 – 2021-09-09 (×14): 10 mg via ORAL
  Filled 2021-09-02 (×15): qty 1

## 2021-09-02 MED ORDER — TERBINAFINE HCL 1 % EX CREA
TOPICAL_CREAM | Freq: Two times a day (BID) | CUTANEOUS | Status: DC
  Administered 2021-09-04 – 2021-09-11 (×4): 1 via TOPICAL
  Filled 2021-09-02 (×2): qty 12

## 2021-09-02 MED ORDER — ALUM & MAG HYDROXIDE-SIMETH 200-200-20 MG/5ML PO SUSP: 30.0000 mL | ORAL | Status: DC | PRN

## 2021-09-02 MED ORDER — LORATADINE 10 MG PO TABS
10.0000 mg | ORAL_TABLET | Freq: Every day | ORAL | Status: DC
  Administered 2021-09-03 – 2021-09-11 (×9): 10 mg via ORAL
  Filled 2021-09-02 (×9): qty 1

## 2021-09-02 MED ORDER — SODIUM CHLORIDE 0.9% FLUSH: 10.0000 mL | INTRAVENOUS | Status: DC | PRN

## 2021-09-02 MED ORDER — SALINE SPRAY 0.65 % NA SOLN
1.0000 | NASAL | Status: DC | PRN
  Administered 2021-09-03 – 2021-09-06 (×2): 1 via NASAL

## 2021-09-02 MED ORDER — HYDROCORTISONE ACETATE 25 MG RE SUPP
25.0000 mg | Freq: Two times a day (BID) | RECTAL | Status: DC
  Administered 2021-09-06 – 2021-09-09 (×5): 25 mg via RECTAL
  Filled 2021-09-02 (×20): qty 1

## 2021-09-02 MED ORDER — PROCHLORPERAZINE MALEATE 5 MG PO TABS
5.0000 mg | ORAL_TABLET | Freq: Four times a day (QID) | ORAL | Status: DC | PRN
  Administered 2021-09-04 – 2021-09-06 (×2): 10 mg via ORAL
  Filled 2021-09-02 (×3): qty 2

## 2021-09-02 MED ORDER — GUAIFENESIN-DM 100-10 MG/5ML PO SYRP
10.0000 mL | ORAL_SOLUTION | Freq: Four times a day (QID) | ORAL | Status: DC
  Administered 2021-09-02 – 2021-09-07 (×17): 10 mL via ORAL
  Filled 2021-09-02 (×18): qty 10

## 2021-09-02 MED ORDER — MELATONIN 5 MG PO TABS
5.0000 mg | ORAL_TABLET | Freq: Every evening | ORAL | Status: DC | PRN
  Administered 2021-09-05 – 2021-09-07 (×2): 5 mg via ORAL
  Filled 2021-09-02 (×2): qty 1

## 2021-09-02 MED ORDER — FUROSEMIDE 10 MG/ML IJ SOLN
20.0000 mg | Freq: Once | INTRAMUSCULAR | Status: AC
  Administered 2021-09-02: 20 mg via INTRAVENOUS
  Filled 2021-09-02: qty 2

## 2021-09-02 MED ORDER — POLYETHYLENE GLYCOL 3350 17 G PO PACK
17.0000 g | PACK | Freq: Three times a day (TID) | ORAL | Status: DC
  Administered 2021-09-03 – 2021-09-04 (×2): 17 g via ORAL
  Filled 2021-09-02 (×6): qty 1

## 2021-09-02 MED ORDER — CHLORHEXIDINE GLUCONATE CLOTH 2 % EX PADS
6.0000 | MEDICATED_PAD | Freq: Every day | CUTANEOUS | Status: DC
  Administered 2021-09-03 – 2021-09-11 (×8): 6 via TOPICAL

## 2021-09-02 MED ORDER — PANTOPRAZOLE SODIUM 40 MG PO TBEC
40.0000 mg | DELAYED_RELEASE_TABLET | Freq: Every day | ORAL | Status: DC
  Administered 2021-09-03 – 2021-09-11 (×9): 40 mg via ORAL
  Filled 2021-09-02 (×9): qty 1

## 2021-09-02 MED ORDER — ACETAMINOPHEN 325 MG PO TABS
325.0000 mg | ORAL_TABLET | ORAL | Status: DC | PRN
  Administered 2021-09-03 – 2021-09-10 (×6): 650 mg via ORAL
  Filled 2021-09-02 (×6): qty 2

## 2021-09-02 MED ORDER — METOPROLOL TARTRATE 25 MG PO TABS
25.0000 mg | ORAL_TABLET | Freq: Three times a day (TID) | ORAL | Status: DC
  Administered 2021-09-02 – 2021-09-11 (×27): 25 mg via ORAL
  Filled 2021-09-02 (×27): qty 1

## 2021-09-02 MED ORDER — JUVEN PO PACK
1.0000 | PACK | Freq: Two times a day (BID) | ORAL | Status: DC
  Administered 2021-09-05: 1 via ORAL
  Filled 2021-09-02 (×8): qty 1

## 2021-09-02 MED ORDER — NYSTATIN 100000 UNIT/GM EX POWD
Freq: Three times a day (TID) | CUTANEOUS | Status: DC
  Administered 2021-09-07 – 2021-09-11 (×4): 1 via TOPICAL
  Filled 2021-09-02: qty 15

## 2021-09-02 MED ORDER — CALCITRIOL 0.25 MCG PO CAPS
0.2500 ug | ORAL_CAPSULE | ORAL | Status: DC
  Administered 2021-09-04 – 2021-09-10 (×3): 0.25 ug via ORAL
  Filled 2021-09-02 (×3): qty 1

## 2021-09-02 MED ORDER — ARFORMOTEROL TARTRATE 15 MCG/2ML IN NEBU
15.0000 ug | INHALATION_SOLUTION | Freq: Two times a day (BID) | RESPIRATORY_TRACT | Status: DC
  Administered 2021-09-02 – 2021-09-11 (×18): 15 ug via RESPIRATORY_TRACT
  Filled 2021-09-02 (×18): qty 2

## 2021-09-02 MED ORDER — ZINC SULFATE 220 (50 ZN) MG PO CAPS
220.0000 mg | ORAL_CAPSULE | Freq: Every day | ORAL | Status: DC
  Administered 2021-09-03 – 2021-09-11 (×9): 220 mg via ORAL
  Filled 2021-09-02 (×8): qty 1

## 2021-09-02 MED ORDER — LINACLOTIDE 145 MCG PO CAPS
145.0000 ug | ORAL_CAPSULE | Freq: Every day | ORAL | Status: DC
  Administered 2021-09-02 – 2021-09-11 (×10): 145 ug via ORAL
  Filled 2021-09-02 (×9): qty 1

## 2021-09-02 MED ORDER — CYCLOBENZAPRINE HCL 10 MG PO TABS: 10.0000 mg | ORAL_TABLET | Freq: Three times a day (TID) | ORAL | Status: DC | PRN

## 2021-09-02 MED ORDER — PROCHLORPERAZINE EDISYLATE 10 MG/2ML IJ SOLN
5.0000 mg | Freq: Four times a day (QID) | INTRAMUSCULAR | Status: DC | PRN
  Administered 2021-09-02 – 2021-09-09 (×3): 10 mg via INTRAMUSCULAR
  Filled 2021-09-02 (×3): qty 2

## 2021-09-02 MED ORDER — PROSOURCE PLUS PO LIQD
30.0000 mL | Freq: Two times a day (BID) | ORAL | Status: DC
  Administered 2021-09-02 – 2021-09-11 (×18): 30 mL via ORAL
  Filled 2021-09-02 (×18): qty 30

## 2021-09-02 MED ORDER — SERTRALINE HCL 100 MG PO TABS
100.0000 mg | ORAL_TABLET | Freq: Every day | ORAL | Status: DC
  Administered 2021-09-03 – 2021-09-11 (×9): 100 mg via ORAL
  Filled 2021-09-02 (×9): qty 1

## 2021-09-02 MED ORDER — HEPARIN (PORCINE) 25000 UT/250ML-% IV SOLN
950.0000 [IU]/h | INTRAVENOUS | Status: DC
  Administered 2021-09-02: 20:00:00 700 [IU]/h via INTRAVENOUS
  Filled 2021-09-02: qty 250

## 2021-09-02 MED ORDER — ASCORBIC ACID 500 MG PO TABS
500.0000 mg | ORAL_TABLET | Freq: Every day | ORAL | Status: DC
  Administered 2021-09-03 – 2021-09-11 (×8): 500 mg via ORAL
  Filled 2021-09-02 (×9): qty 1

## 2021-09-02 MED ORDER — INSULIN GLARGINE-YFGN 100 UNIT/ML ~~LOC~~ SOLN
20.0000 [IU] | Freq: Every day | SUBCUTANEOUS | Status: DC
  Administered 2021-09-03 – 2021-09-08 (×6): 20 [IU] via SUBCUTANEOUS
  Filled 2021-09-02 (×7): qty 0.2

## 2021-09-02 MED ORDER — NITROGLYCERIN 0.4 MG SL SUBL
0.4000 mg | SUBLINGUAL_TABLET | SUBLINGUAL | Status: DC | PRN
  Administered 2021-09-09 (×2): 0.4 mg via SUBLINGUAL
  Filled 2021-09-02: qty 1

## 2021-09-02 MED ORDER — INSULIN ASPART 100 UNIT/ML IJ SOLN: 0.0000 [IU] | Freq: Every day | INTRAMUSCULAR | Status: DC

## 2021-09-02 MED ORDER — INSULIN ASPART 100 UNIT/ML IJ SOLN
8.0000 [IU] | Freq: Three times a day (TID) | INTRAMUSCULAR | Status: DC
  Administered 2021-09-02: 8 [IU] via SUBCUTANEOUS

## 2021-09-02 MED ORDER — GUAIFENESIN-DM 100-10 MG/5ML PO SYRP
5.0000 mL | ORAL_SOLUTION | Freq: Four times a day (QID) | ORAL | Status: DC | PRN
  Administered 2021-09-02 – 2021-09-05 (×2): 5 mL via ORAL
  Filled 2021-09-02: qty 5

## 2021-09-02 MED ORDER — BUDESONIDE 0.5 MG/2ML IN SUSP
2.0000 mL | Freq: Two times a day (BID) | RESPIRATORY_TRACT | Status: DC
  Administered 2021-09-02 – 2021-09-11 (×18): 0.5 mg via RESPIRATORY_TRACT
  Filled 2021-09-02 (×18): qty 2

## 2021-09-02 MED ORDER — ADULT MULTIVITAMIN W/MINERALS CH
1.0000 | ORAL_TABLET | Freq: Every day | ORAL | Status: DC
  Administered 2021-09-03 – 2021-09-11 (×9): 1 via ORAL
  Filled 2021-09-02 (×9): qty 1

## 2021-09-02 MED ORDER — GABAPENTIN 100 MG PO CAPS
100.0000 mg | ORAL_CAPSULE | Freq: Every day | ORAL | Status: DC
  Administered 2021-09-02 – 2021-09-10 (×9): 100 mg via ORAL
  Filled 2021-09-02 (×9): qty 1

## 2021-09-02 MED ORDER — ROSUVASTATIN CALCIUM 20 MG PO TABS
20.0000 mg | ORAL_TABLET | Freq: Every day | ORAL | Status: DC
  Administered 2021-09-03 – 2021-09-11 (×9): 20 mg via ORAL
  Filled 2021-09-02 (×9): qty 1

## 2021-09-02 NOTE — H&P (Incomplete)
Physician Discharge Summary  Patient ID: Katherine [name] MRN: [id number] DOB/AGE: 1956/08/11 66 y.o.  Admit date: 08/25/2021 Discharge date: 09/02/2021  Discharge Diagnoses:  Principal Problem:   Debility Active Problems:   Peripheral arterial disease ([place])   Pneumonia, viral   Post covid-19 condition, unspecified   Longstanding persistent atrial fibrillation ([place])   Discharged Condition: poor  Significant Diagnostic Studies: DG Chest 2 View  Result Date: 08/29/2021 CLINICAL DATA:  Shortness of breath EXAM: CHEST - 2 VIEW COMPARISON:  08/14/2021 FINDINGS: Cardiac shadow remains enlarged. Postsurgical changes are noted. Increased vascular congestion and mild pulmonary edema right greater than left is noted. Left-sided PICC line is again identified likely in the left SVC. No pneumothorax or sizable effusion is seen. No bony abnormality is noted. IMPRESSION: CHF with parenchymal edema worse on the right than the left. These results will be called to the ordering clinician or representative by the Radiologist Assistant, and communication documented in the PACS or [employer]. Electronically Signed   By: Inez Catalina M.D.   On: 08/29/2021 22:11   DG Knee 1-2 Views Right  Result Date: 09/01/2021 CLINICAL DATA:  Hervey Ard right knee pain, no injury. EXAM: RIGHT KNEE - 1-2 VIEW COMPARISON:  None. FINDINGS: Lateral view is slightly rotated. No definite joint effusion. No fracture. Chondrocalcinosis in the lateral compartment. Scattered surgical clips in the soft tissues. IMPRESSION: 1. No acute findings. 2. Chondrocalcinosis in the lateral compartment of the knee. Electronically Signed   By: Lorin Picket M.D.   On: 09/01/2021 11:41   CT Angio Chest Pulmonary Embolism (PE) W or WO Contrast  Result Date: 08/05/2021 CLINICAL DATA:  PE suspected, shortness of breath, COVID positive EXAM: CT ANGIOGRAPHY CHEST WITH CONTRAST TECHNIQUE: Multidetector CT imaging of the chest was performed using the  standard protocol during bolus administration of intravenous contrast. Multiplanar CT image reconstructions and MIPs were obtained to evaluate the vascular anatomy. CONTRAST:  57mL OMNIPAQUE IOHEXOL 350 MG/ML SOLN COMPARISON:  None. FINDINGS: Cardiovascular: Satisfactory opacification of the pulmonary arteries to the segmental level. Examination for pulmonary embolism is limited by breath motion artifact. Within this limitation, no evidence of pulmonary embolism through the segmental pulmonary arterial level. Cardiomegaly. Extensive 3 vessel coronary artery calcifications status post median sternotomy and CABG. No pericardial effusion. Aortic atherosclerosis. Mediastinum/Nodes: No enlarged mediastinal, hilar, or axillary lymph nodes. Thyroid gland, trachea, and esophagus demonstrate no significant findings. Lungs/Pleura: Very extensive heterogeneous and ground-glass airspace opacity throughout the lungs. No pleural effusion or pneumothorax. Upper Abdomen: No acute abnormality. Status post cholecystectomy. Adjustable gastric lap band. Very atrophic kidneys. Musculoskeletal: No chest wall abnormality. No acute or significant osseous findings. Review of the MIP images confirms the above findings. IMPRESSION: 1. Examination for pulmonary embolism is limited by breath motion artifact. Within this limitation, no evidence of pulmonary embolism through the segmental pulmonary arterial level. 2. Very extensive heterogeneous and ground-glass airspace opacity throughout the lungs, consistent with COVID-19 airspace disease. 3. Cardiomegaly and coronary artery disease. Aortic Atherosclerosis (ICD10-I70.0). Electronically Signed   By: Delanna Ahmadi M.D.   On: 08/05/2021 12:58   MR LUMBAR SPINE WO CONTRAST  Result Date: 08/29/2021 CLINICAL DATA:  Initial evaluation for low back pain with right lower extremity pain. Immunocompromised. EXAM: MRI LUMBAR SPINE WITHOUT CONTRAST TECHNIQUE: Multiplanar, multisequence MR imaging of  the lumbar spine was performed. No intravenous contrast was administered. COMPARISON:  Prior MRI from 08/25/2017. FINDINGS: Segmentation: Standard. Lowest well-formed disc space labeled the L5-S1 level. Alignment: 4 mm anterolisthesis of L4 on  L5, chronic and facet mediated, and mildly progressed from prior. Trace retrolisthesis of T11 on T12, stable. Alignment otherwise normal with preservation of the normal lumbar lordosis. Vertebrae: Vertebral body height maintained without acute or chronic fracture. Bone marrow signal intensity within normal limits. No discrete or worrisome osseous lesions or abnormal marrow edema. Conus medullaris and cauda equina: Conus extends to the L1-2 level. Conus and cauda equina appear normal. Paraspinal and other soft tissues: Paraspinous soft tissues demonstrate no acute finding. The visualized native kidneys are markedly atrophic. Multifocal atheromatous irregularity seen throughout the intra-abdominal aorta without visible aneurysm. Disc levels: T10-11: Seen only on sagittal projection. Intervertebral disc space narrowing with disc desiccation and mild disc bulge. Bilateral facet hypertrophy. No significant spinal stenosis. Foramina appear grossly patent. T11-12: Intervertebral disc space narrowing with mild disc bulge and disc desiccation. No significant stenosis. T12-L1: Unremarkable. L1-2: Disc desiccation without significant disc bulge. No canal or foraminal stenosis. L2-3: Disc desiccation without significant disc bulge. No canal or foraminal stenosis. L3-4: Mild intervertebral disc space narrowing with diffuse disc bulge. Superimposed broad-based left foraminal to extraforaminal disc protrusion contacts the exiting left L3 nerve root (series 6, image 24). Associated annular fissure. Mild bilateral facet hypertrophy. No significant spinal stenosis. Mild left L3 foraminal narrowing. Right neural foramina remains patent. L4-5: 4 mm anterolisthesis. Mild disc bulge with disc  desiccation. Superimposed left foraminal to extraforaminal disc protrusion contacts the exiting left L4 nerve root (series 3, image 12). Advanced bilateral facet arthrosis, progressed from previous. No significant canal or lateral recess stenosis. Mild left greater than right L4 foraminal narrowing. L5-S1: Broad-based left eccentric disc bulge closely approximates and/or contacts the exiting left L5 nerve root (series 6, image 35). Moderate bilateral facet arthrosis. No significant spinal stenosis. Mild left L5 foraminal narrowing without impingement. Right foramen remains patent. IMPRESSION: 1. Broad-based left foraminal to extraforaminal disc protrusions at L3-4 and L4-5, contacting and potentially irritating the exiting left L3 and L4 nerve roots respectively. 2. Broad-based left eccentric disc bulge at L5-S1, closely approximating and/or potentially irritating the exiting left L5 nerve root. 3. Advanced bilateral facet arthrosis at L4-5 with associated 4 mm anterolisthesis, mildly progressed as compared to 2019. 4. No other acute abnormality within the lumbar spine. No findings to explain patient's right lower extremity symptoms identified. Aortic Atherosclerosis (ICD10-I70.0). Electronically Signed   By: Jeannine Boga M.D.   On: 08/29/2021 03:39   CARDIAC CATHETERIZATION  Result Date: 08/21/2021 CONCLUSIONS: Bilateral iliofemoral obstruction preventing catheterization.  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 Assumed patency of the left internal mammary over the last 2-3 heart catheterizations of the been performed. Prior hemodialysis access in the left arm preventing access. Prior coronary angiography over the past 2 years has been from right radial with native right coronary, left coronary, and saphenous vein bypass graft angiography and PCI performed from that approach. RECOMMENDATIONS: Medical therapy for coronary  disease.  Elevated troponin in the setting of COVID-pneumonia given her underlying coronary disease could be ascribed to demand ischemia. The graft to the diagonal contains overlapping and it x2 layers of stents with distal anastomosis ISR that was recently dilated in July.  This could be the culprit for ongoing angina.  It seems unreasonable to continue to dilate in this territory without a long-term solution. Would not perform coronary angiography again in the future unless the patient is having clear-cut ACS/STEMI.  If in that situation the only recourse would be  to use right radial approach as vascular access. Discussed with Dr. Stanford Breed who will try to uptitrate the patient's medical regimen. Discussed in detail with the patient the limited options that we have for her coronary subset.   DG CHEST PORT 1 VIEW  Result Date: 09/02/2021 CLINICAL DATA:  Short of breath EXAM: PORTABLE CHEST 1 VIEW COMPARISON:  None. FINDINGS: Sternotomy wires overlie normal cardiac silhouette. Patient rotated leftward. Diffuse patchy airspace opacities are similar comparison exam. No pleural fluid. No pneumothorax. IMPRESSION: No change in diffuse patchy bilateral airspace disease consistent with multifocal pneumonia versus pulmonary edema. Electronically Signed   By: Suzy Bouchard M.D.   On: 09/02/2021 09:25   DG CHEST PORT 1 VIEW  Result Date: 08/14/2021 CLINICAL DATA:  COVID positive. EXAM: PORTABLE CHEST 1 VIEW COMPARISON:  08/12/2021 FINDINGS: Postoperative changes in the mediastinum. Mild cardiac enlargement. Patchy perihilar infiltrates in both lungs may represent edema or multifocal pneumonia. No pleural effusions. No pneumothorax. Mediastinal contours appear intact. IMPRESSION: Cardiac enlargement with patchy perihilar infiltrates in both lungs, possibly edema or multifocal pneumonia. Electronically Signed   By: Lucienne Capers M.D.   On: 08/14/2021 19:02   DG CHEST PORT 1 VIEW  Result Date:  08/12/2021 CLINICAL DATA:  PICC line placement. EXAM: PORTABLE CHEST 1 VIEW COMPARISON:  Radiographs 08/11/2021, 08/09/2021 and 08/07/2021. CTA 08/05/2021. FINDINGS: 1332 hours. Left arm PICC projects over the left perihilar region, which in correlation with recent CTA is at the level of a persistent left-sided SVC. Stable mild cardiomegaly post median sternotomy and CABG. The diffuse bilateral pulmonary opacities demonstrate further partial clearing. There is no confluent airspace opacity, pneumothorax or significant pleural effusion. The bones appear unchanged. IMPRESSION: 1. Tip of the left arm PICC projects over the mid aspect of a persistent left-sided SVC seen on prior CT. 2. Improving bilateral airspace opacities consistent with improving COVID-19 infection. Electronically Signed   By: Richardean Sale M.D.   On: 08/12/2021 14:07   DG CHEST PORT 1 VIEW  Result Date: 08/11/2021 CLINICAL DATA:  Respiratory failure EXAM: PORTABLE CHEST 1 VIEW COMPARISON:  08/09/2021 FINDINGS: Multifocal patchy opacities with relative sparing of the left upper lobe, reflecting multifocal pneumonia, grossly unchanged. No pleural effusion or pneumothorax. Mild cardiomegaly.  Postsurgical changes related to prior CABG. Median sternotomy. IMPRESSION: Multifocal pneumonia, grossly unchanged. Electronically Signed   By: Julian Hy M.D.   On: 08/11/2021 06:57   DG CHEST PORT 1 VIEW  Result Date: 08/09/2021 CLINICAL DATA:  MRSA pneumonia EXAM: PORTABLE CHEST 1 VIEW COMPARISON:  10/08/2020 FINDINGS: Changes of CABG. Cardiomegaly. Severe diffuse bilateral airspace disease, worsening since prior study. No effusions. No acute bony abnormality. IMPRESSION: Severe diffuse bilateral airspace disease, worsening since prior study. Electronically Signed   By: Rolm Baptise M.D.   On: 08/09/2021 09:21   DG CHEST PORT 1 VIEW  Result Date: 08/07/2021 CLINICAL DATA:  Shortness of breath, COVID-19 positivity, initial encounter  EXAM: PORTABLE CHEST 1 VIEW COMPARISON:  08/05/2021 FINDINGS: Cardiac shadow is stable. Lungs are well aerated bilaterally. Patchy airspace opacity is noted slightly improved when compare with the prior exam. No new focal abnormality is noted. IMPRESSION: Patchy airspace opacities improved in the interval from the prior study. Electronically Signed   By: Inez Catalina M.D.   On: 08/07/2021 22:35   DG CHEST PORT 1 VIEW  Result Date: 08/05/2021 CLINICAL DATA:  Weakness and shortness of breath. EXAM: PORTABLE CHEST 1 VIEW COMPARISON:  08/04/2021 FINDINGS: 0804 hours. Low volumes. The cardio pericardial silhouette  is enlarged. Patchy bilateral airspace disease is progressive in the interval. No pleural effusion. The visualized bony structures of the thorax show no acute abnormality. Telemetry leads overlie the chest. IMPRESSION: Patchy bilateral airspace disease, progressive in the interval. Electronically Signed   By: Misty Stanley M.D.   On: 08/05/2021 08:31   DG Chest Portable 1 View  Result Date: 08/04/2021 CLINICAL DATA:  COVID positive with generalized weakness. EXAM: PORTABLE CHEST 1 VIEW COMPARISON:  None. FINDINGS: Multiple sternal wires and vascular clips are seen. Mild, ill-defined areas of atelectasis and/or infiltrate are seen throughout the right lung and periphery of the left lung base. There is no evidence of a pleural effusion or pneumothorax. The cardiac silhouette is mildly enlarged and unchanged in size. The visualized skeletal structures are unremarkable. IMPRESSION: 1. Evidence of prior median sternotomy/CABG. 2. Ill-defined areas of bilateral atelectasis and/or infiltrate. Electronically Signed   By: Virgina Norfolk M.D.   On: 08/04/2021 03:57   VAS [place] ABI WITH/WO TBI  Result Date: 08/30/2021  LOWER EXTREMITY DOPPLER STUDY Patient Name:  Aizlynn Digilio  Date of Exam:   08/30/2021 Medical Rec #: [id number]           Accession #:    [id number] Date of Birth: [birth year]            Patient Gender: F Patient Age:   36 years Exam Location:  [facility] Procedure:      VAS [place] ABI WITH/WO TBI Referring Phys: Alysia Penna --------------------------------------------------------------------------------  Indications: Rest pain, specifically around the shin area. Pperipheral artery              disease, (cath attempted 08/18/2021- Bilateral iliofemoral              obstruction) preventing catheterization. 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 High Risk         Hypertension, hyperlipidemia, Diabetes, coronary artery Factors:          disease. Other Factors: Recent Covid pneumonia, NSTEMI, history of renal transplant. CABG                1994.  Vascular Interventions: No prior. Comparison Study: Prior LEA done 08/28/13 indicating possible inflow disease on                   the right suggested by common femoral waveforms and severe                   bilateral common femoral artery stenosis. Performing Technologist: Sharion Dove RVS  Examination Guidelines: A complete evaluation includes at minimum, Doppler waveform signals and systolic blood pressure reading at the level of bilateral brachial, anterior tibial, and posterior tibial arteries, when vessel segments are accessible. Bilateral testing is considered an integral part of a complete examination. Photoelectric Plethysmograph (PPG) waveforms and toe systolic pressure readings are included as required and additional duplex testing as needed. Limited examinations for reoccurring indications may be performed as noted.  ABI Findings: +---------+------------------+-----+-------------------+--------+  Right     Rt Pressure (mmHg) Index Waveform            Comment   +---------+------------------+-----+-------------------+--------+  Brachial  115  multiphasic                    +---------+------------------+-----+-------------------+--------+  PTA       87                 0.76  dampened monophasic           +---------+------------------+-----+-------------------+--------+  DP        22                 0.19  dampened monophasic           +---------+------------------+-----+-------------------+--------+  Great Toe 31                 0.27                                +---------+------------------+-----+-------------------+--------+ +---------+------------------+-----+----------+--------------------+  Left      Lt Pressure (mmHg) Index Waveform   Comment               +---------+------------------+-----+----------+--------------------+  Brachial                                      restricted (fistula)  +---------+------------------+-----+----------+--------------------+  PTA       254                2.21  monophasic                       +---------+------------------+-----+----------+--------------------+  DP        254                2.21  monophasic                       +---------+------------------+-----+----------+--------------------+  Great Toe 73                 0.63                                   +---------+------------------+-----+----------+--------------------+ +-------+----------------+-----------+------------+------------+  ABI/TBI Today's ABI      Today's TBI Previous ABI Previous TBI  +-------+----------------+-----------+------------+------------+  Right   0.76             0.27        0.96         0.70          +-------+----------------+-----------+------------+------------+  Left    non compressible 0.63        1.04         0.71          +-------+----------------+-----------+------------+------------+ Right ABIs appear decreased compared to prior study on 08/28/13. Bilateral TBIs appear decreased compared to prior study on 08/28/13. Left ABI is now non compressible since study done 08/28/13  Summary: Right: Resting right ankle-brachial index indicates moderate right lower  extremity arterial disease. The right toe-brachial index is abnormal. ABI indicates a moderate decrease in flow by velocity, however, significantly dampened waveforms indicates a more severe level of disease. Left: Resting left ankle-brachial index indicates noncompressible left lower extremity arteries. The left toe-brachial index is abnormal.  *See table(s) above for measurements and observations.  Electronically signed by Jamelle Haring on 08/30/2021 at 3:15:06 PM.    Final    ECHOCARDIOGRAM COMPLETE  Result Date: 08/09/2021    ECHOCARDIOGRAM REPORT   Patient Name:   SHAMIKA PEDREGON Date of Exam: 08/09/2021 Medical Rec #:  [id number]          Height:       61.0 in Accession #:    [id number]         Weight:       205.2 lb Date of Birth:  July 21, 1956          BSA:          1.910 m Patient Age:    42 years           BP:           161/81 mmHg Patient Gender: F                  HR:           58 bpm. Exam Location:  Inpatient Procedure: 2D Echo, Cardiac Doppler, Color Doppler and Intracardiac            Opacification Agent Indications:    Dyspnea  History:        Patient has prior history of Echocardiogram examinations, most                 recent 01/30/2021. Previous Myocardial Infarction and CAD, Prior                 CABG, Signs/Symptoms:Dyspnea; Risk Factors:Hypertension,                 Diabetes and Dyslipidemia. Covid positive.  Sonographer:    Merrie Roof RDCS Referring Phys: [place]  1. Left ventricular ejection fraction, by estimation, is 45 to 50%. The left ventricle has mildly decreased function. The left ventricle demonstrates regional wall motion abnormalities (see scoring diagram/findings for description). There is mild left ventricular hypertrophy. Left ventricular diastolic function could not be evaluated.  2. Right ventricular systolic function is normal. The right ventricular size is normal.  3. The mitral valve is grossly normal. Trivial mitral valve regurgitation.   4. The aortic valve has an indeterminant number of cusps. There is moderate calcification of the aortic valve. Aortic valve regurgitation is not visualized. Aortic valve sclerosis/calcification is present, without any evidence of aortic stenosis. Comparison(s): Prior images reviewed side by side. Wall motion more similar to last study with Definity contrast on 07/12/2020, although LVEF somewhat better. FINDINGS  Left Ventricle: Left ventricular ejection fraction, by estimation, is 45 to 50%. The left ventricle has mildly decreased function. The left ventricle demonstrates regional wall motion abnormalities. Definity contrast agent was given IV to delineate the left ventricular endocardial borders. The left ventricular internal cavity size was normal in size. There is mild left ventricular hypertrophy. Left ventricular diastolic function could not be evaluated.  LV Wall Scoring: The apex is dyskinetic. The apical septal segment is hypokinetic. The entire anterior wall, entire lateral wall, anterior septum, entire inferior wall, mid inferoseptal segment, and basal inferoseptal segment are normal. Right Ventricle: The right ventricular size is normal. No increase in right ventricular wall thickness. Right ventricular systolic function is normal. Left Atrium: Left atrial size was normal in size. Right Atrium: Right atrial size was normal in size. Pericardium: There is no evidence of pericardial effusion. Mitral Valve: The mitral valve is grossly normal. Mild mitral annular calcification. Trivial mitral valve regurgitation. Tricuspid Valve: The tricuspid valve is grossly normal. Tricuspid valve regurgitation is mild. Aortic Valve: The aortic valve has  an indeterminant number of cusps. There is moderate calcification of the aortic valve. Aortic valve regurgitation is not visualized. Aortic valve sclerosis/calcification is present, without any evidence of aortic stenosis. Pulmonic Valve: The pulmonic valve was grossly  normal. Pulmonic valve regurgitation is trivial. Aorta: The aortic root is normal in size and structure. IAS/Shunts: The interatrial septum was not well visualized.  LEFT VENTRICLE PLAX 2D LVIDd:         4.40 cm LVIDs:         3.30 cm LV PW:         1.00 cm LV IVS:        1.10 cm LVOT diam:     2.00 cm LVOT Area:     3.14 cm  LEFT ATRIUM         Index LA diam:    4.80 cm 2.51 cm/m   AORTA Ao Root diam: 3.00 cm  SHUNTS Systemic Diam: 2.00 cm Rozann Lesches MD Electronically signed by Rozann Lesches MD Signature Date/Time: 08/09/2021/3:43:40 PM    Final    VAS US CAROTID  Result Date: 09/02/2021 Carotid Arterial Duplex Study Patient Name:  [name]  Date of Exam:   09/02/2021 Medical Rec #: [id number]           Accession #:    [id number] Date of Birth: 09-29-1955           Patient Gender: F Patient Age:   71 years Exam [facility]  [facility] Procedure:      VAS US CAROTID Referring Phys: Jenkins Rouge --------------------------------------------------------------------------------  Risk Factors:      Hypertension, hyperlipidemia, Diabetes, current smoker,                    coronary artery disease. Limitations        Today's exam was limited due to the body habitus of the                    patient and patient position. Comparison Study:  No prior study Performing Technologist: Maudry Mayhew MHA, RDMS, RVT, RDCS  Examination Guidelines: A complete evaluation includes B-mode imaging, spectral Doppler, color Doppler, and power Doppler as needed of all accessible portions of each vessel. Bilateral testing is considered an integral part of a complete examination. Limited examinations for reoccurring indications may be performed as noted.  Right Carotid Findings: +----------+--------+--------+--------+-------------------------+---------+             PSV cm/s EDV cm/s Stenosis Plaque Description        Comments   +----------+--------+--------+--------+-------------------------+---------+  CCA Prox    52       20                                                     +----------+--------+--------+--------+-------------------------+---------+  CCA Distal 55       12                calcific                  Shadowing  +----------+--------+--------+--------+-------------------------+---------+  ICA Prox   193      57       40-59%   heterogenous and calcific Shadowing  +----------+--------+--------+--------+-------------------------+---------+  ICA Distal 75       17                                                     +----------+--------+--------+--------+-------------------------+---------+  ECA        93       5                                                      +----------+--------+--------+--------+-------------------------+---------+ +----------+--------+-------+--------------+-------------------+             PSV cm/s EDV cms Describe       Arm Pressure (mmHG)  +----------+--------+-------+--------------+-------------------+  Subclavian                  Not identified                      +----------+--------+-------+--------------+-------------------+ +---------+--------+--------+--------------+  Vertebral PSV cm/s EDV cm/s Not identified  +---------+--------+--------+--------------+  Left Carotid Findings: +----------+--------+--------+--------+------------------------------+---------+             PSV cm/s EDV cm/s Stenosis Plaque Description             Comments   +----------+--------+--------+--------+------------------------------+---------+  CCA Prox   95       19                                                          +----------+--------+--------+--------+------------------------------+---------+  CCA Distal 77       24                                                          +----------+--------+--------+--------+------------------------------+---------+  ICA Prox   151      41       40-59%   heterogenous, irregular and    Shadowing                                         calcific                                   +----------+--------+--------+--------+------------------------------+---------+  ICA Distal 137      38                                                          +----------+--------+--------+--------+------------------------------+---------+  ECA        140      34                heterogenous and calcific      shadowing  +----------+--------+--------+--------+------------------------------+---------+ +----------+--------+--------+--------------+-------------------+             PSV cm/s EDV cm/s Describe       Arm Pressure (mmHG)  +----------+--------+--------+--------------+-------------------+  Subclavian                   Not identified                      +----------+--------+--------+--------------+-------------------+ +---------+--------+--+--------+--+---------+  Vertebral PSV cm/s 56 EDV cm/s 20 Antegrade  +---------+--------+--+--------+--+---------+   Summary: Right Carotid: Velocities in the right ICA are consistent with a 40-59%                stenosis. Left Carotid: Velocities in the left ICA are consistent with a 40-59% stenosis. Vertebrals:  Left vertebral artery demonstrates antegrade flow. Right vertebral              artery was not visualized. 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 *See table(s) above for measurements and observations.     Preliminary    VAS [place] LOWER EXTREMITY ARTERIAL DUPLEX  Result Date: 08/30/2021 LOWER EXTREMITY ARTERIAL DUPLEX STUDY Patient Name:  IVYANA LOCEY  Date of Exam:   08/30/2021 Medical Rec #: [id number]           Accession #:    [id number] Date of Birth: 11-20-1955           Patient Gender: F Patient Age:   37 years Exam Location:  [facility] Procedure:      VAS [place] LOWER EXTREMITY ARTERIAL DUPLEX Referring Phys: Alysia Penna --------------------------------------------------------------------------------  Indications: Rest pain,specifically at shin area. 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 High Risk Factors: Hypertension, hyperlipidemia, Diabetes, prior MI, coronary                    artery disease. Other Factors: Kidney transplant, CABG, recent Covid pneumonia.  Vascular Interventions: No prior intervention. Current ABI:            R:0.76 L:non compressible Comparison Study: Prior LEA done 08/28/13 indicating possible inflow disease on                   the right suggested by right CFA waveforms. Severe bilateral                   CFA stenosis. Performing Technologist: Sharion Dove RVS  Examination Guidelines: A complete evaluation includes B-mode imaging, spectral Doppler, color Doppler, and power Doppler as needed of all accessible portions of each vessel. Bilateral testing is considered an integral part of a complete examination. Limited examinations for reoccurring indications may be performed as noted.  +-----------+--------+-----+--------+----------+---------------------+  RIGHT       PSV cm/s Ratio Stenosis Waveform   Comments               +-----------+--------+-----+--------+----------+---------------------+  CFA Prox    52                      monophasic                        +-----------+--------+-----+--------+----------+---------------------+  DFA         22                      monophasic                        +-----------+--------+-----+--------+----------+---------------------+  SFA Prox    43  monophasic collateral flow noted  +-----------+--------+-----+--------+----------+---------------------+  SFA Mid     35                      monophasic                        +-----------+--------+-----+--------+----------+---------------------+  SFA Distal  32                      monophasic                        +-----------+--------+-----+--------+----------+---------------------+  POP  Prox    22                      monophasic                        +-----------+--------+-----+--------+----------+---------------------+  POP Distal  32                      monophasic                        +-----------+--------+-----+--------+----------+---------------------+  ATA Prox    21                      monophasic                        +-----------+--------+-----+--------+----------+---------------------+  ATA Mid     25                      monophasic                        +-----------+--------+-----+--------+----------+---------------------+  ATA Distal  30                      monophasic                        +-----------+--------+-----+--------+----------+---------------------+  PTA Prox    39                      monophasic                        +-----------+--------+-----+--------+----------+---------------------+  PTA Mid     39                      monophasic                        +-----------+--------+-----+--------+----------+---------------------+  PTA Distal  53                      monophasic                        +-----------+--------+-----+--------+----------+---------------------+  PERO Prox                           absent                            +-----------+--------+-----+--------+----------+---------------------+  PERO Mid  absent                            +-----------+--------+-----+--------+----------+---------------------+  PERO Distal                         absent                            +-----------+--------+-----+--------+----------+---------------------+   +----------+--------+-----+--------+----------+--------+  LEFT       PSV cm/s Ratio Stenosis Waveform   Comments  +----------+--------+-----+--------+----------+--------+  CFA Prox   52                      monophasic           +----------+--------+-----+--------+----------+--------+  DFA        45                      monophasic            +----------+--------+-----+--------+----------+--------+  SFA Prox   57                      monophasic           +----------+--------+-----+--------+----------+--------+  SFA Mid    46                      monophasic           +----------+--------+-----+--------+----------+--------+  SFA Distal 32                      monophasic           +----------+--------+-----+--------+----------+--------+  POP Prox   31                      monophasic           +----------+--------+-----+--------+----------+--------+  POP Distal 24                      monophasic           +----------+--------+-----+--------+----------+--------+  ATA Prox   21                      monophasic           +----------+--------+-----+--------+----------+--------+  ATA Mid    25                      monophasic           +----------+--------+-----+--------+----------+--------+  ATA Distal 14                      monophasic           +----------+--------+-----+--------+----------+--------+  PTA Prox   30                      monophasic           +----------+--------+-----+--------+----------+--------+  PTA Mid    28                      monophasic           +----------+--------+-----+--------+----------+--------+  PTA Distal 25  monophasic           +----------+--------+-----+--------+----------+--------+  Summary: Right: Atherosclerotic plaque noted throughout. Monophasic waveforms suggestive of iliofemoral disease. Some collateral flow noted at the mid SFA. Left: Atherosclerotic plaque noted throughout. Monophasic waveforms suggestive of iliofemoral disease.  See table(s) above for measurements and observations. Electronically signed by Jamelle Haring on 08/30/2021 at 3:16:10 PM.    Final    VAS [place] LOWER EXTREMITY VENOUS (DVT)  Result Date: 08/28/2021  Lower Venous DVT Study Patient Name:  TEQUILA ROTTMANN  Date of Exam:   08/28/2021 Medical Rec #: [id number]           Accession #:    [id number] Date of Birth: 31-Aug-1955            Patient Gender: F Patient Age:   64 years Exam Location:  [facility] Procedure:      VAS [place] LOWER EXTREMITY VENOUS (DVT) Referring Phys: Alysia Penna --------------------------------------------------------------------------------  Indications: Pain.  Limitations: Body habitus and poor ultrasound/tissue interface. Comparison Study: 08/09/2021- negative bilateral lower extremity venous duplex Performing Technologist: Maudry Mayhew MHA, RDMS, RVT, RDCS  Examination Guidelines: A complete evaluation includes B-mode imaging, spectral Doppler, color Doppler, and power Doppler as needed of all accessible portions of each vessel. Bilateral testing is considered an integral part of a complete examination. Limited examinations for reoccurring indications may be performed as noted. The reflux portion of the exam is performed with the patient in reverse Trendelenburg.  +---------+---------------+---------+-----------+----------+--------------+  RIGHT     Compressibility Phasicity Spontaneity Properties Thrombus Aging  +---------+---------------+---------+-----------+----------+--------------+  CFV       Full            Yes       Yes                                    +---------+---------------+---------+-----------+----------+--------------+  SFJ       Full                                                             +---------+---------------+---------+-----------+----------+--------------+  FV Prox   Full                                                             +---------+---------------+---------+-----------+----------+--------------+  FV Mid    Full                                                             +---------+---------------+---------+-----------+----------+--------------+  FV Distal Full                                                             +---------+---------------+---------+-----------+----------+--------------+  PFV       Full                                                              +---------+---------------+---------+-----------+----------+--------------+  POP       Full            Yes       Yes                                    +---------+---------------+---------+-----------+----------+--------------+  PTV       Full                                                             +---------+---------------+---------+-----------+----------+--------------+  PERO      Full                                                             +---------+---------------+---------+-----------+----------+--------------+   Right Technical Findings: Not visualized segments include limited visualization PTV and peroneal veins.  +---------+---------------+---------+-----------+----------+--------------+  LEFT      Compressibility Phasicity Spontaneity Properties Thrombus Aging  +---------+---------------+---------+-----------+----------+--------------+  CFV       Full            Yes       Yes                                    +---------+---------------+---------+-----------+----------+--------------+  SFJ       Full                                                             +---------+---------------+---------+-----------+----------+--------------+  FV Prox   Full                                                             +---------+---------------+---------+-----------+----------+--------------+  FV Mid    Full                                                             +---------+---------------+---------+-----------+----------+--------------+  FV Distal Full                                                             +---------+---------------+---------+-----------+----------+--------------+  PFV       Full                                                             +---------+---------------+---------+-----------+----------+--------------+  POP       Full            Yes       Yes                                    +---------+---------------+---------+-----------+----------+--------------+  PTV        Full                                                             +---------+---------------+---------+-----------+----------+--------------+  PERO      Full                                                             +---------+---------------+---------+-----------+----------+--------------+   Left Technical Findings: Not visualized segments include limited visualization PTV and peroneal veins.   Summary: RIGHT: - There is no evidence of deep vein thrombosis in the lower extremity. However, portions of this examination were limited- see technologist comments above.  - No cystic structure found in the popliteal fossa.  LEFT: - There is no evidence of deep vein thrombosis in the lower extremity. However, portions of this examination were limited- see technologist comments above.  - No cystic structure found in the popliteal fossa.  *See table(s) above for measurements and observations. Electronically signed by Orlie Pollen on 08/28/2021 at 3:58:46 PM.    Final    VAS [place] LOWER EXTREMITY VENOUS (DVT)  Result Date: 08/10/2021  Lower Venous DVT Study Patient Name:  DARRION MACAULAY  Date of Exam:   08/09/2021 Medical Rec #: [id number]           Accession #:    [id number] Date of Birth: 1956-05-09           Patient Gender: F Patient Age:   69 years Exam Location:  [facility] Procedure:      VAS [place] LOWER EXTREMITY VENOUS (DVT) Referring Phys: A POWELL JR --------------------------------------------------------------------------------  Indications: Covid, D-Dimer.  Limitations: On high flow oxygen. Pain with compression and body habitus. Comparison Study: Prior negative bilateral LEV done 01/07/2011 Performing Technologist: Sharion Dove RVS  Examination Guidelines: A complete evaluation includes B-mode imaging, spectral Doppler, color Doppler, and power Doppler as needed of all accessible portions of each vessel. Bilateral testing is considered an integral part of a complete examination. Limited examinations  for reoccurring indications may be performed as noted. The reflux portion of the exam is performed with the patient in reverse Trendelenburg.  +---------+---------------+---------+-----------+----------+--------------+  RIGHT     Compressibility Phasicity Spontaneity Properties Thrombus Aging  +---------+---------------+---------+-----------+----------+--------------+  CFV       Full  Yes       Yes                                    +---------+---------------+---------+-----------+----------+--------------+  SFJ       Full                                                             +---------+---------------+---------+-----------+----------+--------------+  FV Prox   Full                                                             +---------+---------------+---------+-----------+----------+--------------+  FV Mid    Full                                                             +---------+---------------+---------+-----------+----------+--------------+  FV Distal Full                                                             +---------+---------------+---------+-----------+----------+--------------+  PFV       Full                                                             +---------+---------------+---------+-----------+----------+--------------+  POP       Full            Yes       Yes                                    +---------+---------------+---------+-----------+----------+--------------+  PTV       Full                                                             +---------+---------------+---------+-----------+----------+--------------+  PERO      Full                                                             +---------+---------------+---------+-----------+----------+--------------+   +---------+---------------+---------+-----------+----------+--------------+  LEFT      Compressibility Phasicity Spontaneity  Properties Thrombus Aging   +---------+---------------+---------+-----------+----------+--------------+  CFV       Full            Yes       Yes                                    +---------+---------------+---------+-----------+----------+--------------+  SFJ       Full                                                             +---------+---------------+---------+-----------+----------+--------------+  FV Prox   Full                                                             +---------+---------------+---------+-----------+----------+--------------+  FV Mid    Full                                                             +---------+---------------+---------+-----------+----------+--------------+  FV Distal Full                                                             +---------+---------------+---------+-----------+----------+--------------+  PFV       Full                                                             +---------+---------------+---------+-----------+----------+--------------+  POP       Full            Yes       Yes                                    +---------+---------------+---------+-----------+----------+--------------+  PTV       Full                                                             +---------+---------------+---------+-----------+----------+--------------+  PERO      Full                                                             +---------+---------------+---------+-----------+----------+--------------+  Summary: BILATERAL: No ultrasound evidence of deep venous thrombosis  -No evidence of popliteal cyst, bilaterally.   *See table(s) above for measurements and observations. Electronically signed by Jamelle Haring on 08/10/2021 at 3:30:07 PM.    Final    [place] EKG SITE RITE  Result Date: 08/11/2021 If Site Rite image not attached, placement could not be confirmed due to current cardiac rhythm.   Labs:  Basic Metabolic Panel: Recent Labs  Lab 09/01/21 0345  NA 135  K 4.0  CL 101  CO2 28   GLUCOSE 284*  BUN 23  CREATININE 0.75  CALCIUM 8.8*    CBC: Recent Labs  Lab 09/01/21 0345 09/02/21 0447  WBC 5.4 7.0  HGB 8.6* 9.2*  HCT 28.4* 29.7*  MCV 104.8* 103.8*  PLT 137* 173    CBG: Recent Labs  Lab 09/01/21 1152 09/01/21 1627 09/01/21 2156 09/02/21 0616 09/02/21 1153  GLUCAP 244* 211* 230* 243* 255*    Brief HPI:   Miya Luviano is a 66 y.o. female ***   Hospital Course: Ishika Chesterfield was admitted to rehab 08/25/2021 for inpatient therapies to consist of PT and OT at least three hours five days a week. Past admission physiatrist, therapy team and rehab RN have worked together to provide customized collaborative inpatient rehab.  During patient's stay in rehab weekly team conferences were held to monitor patient's progress, set goals and discuss barriers to discharge. At admission, patient required  Blood pressures were monitored on TID basis and   Diabetes has been monitored with ac/hs CBG checks and SSI was use prn for tighter BS control.        Medications at discharge:    Disposition: To acute floor Eliquis 5 mg po bid Brovana 15 mcg nebulized BID Imuran 125 mg po daily Vitamin C 500 mg po daily Pulmicort 0.5 mg nebulized BID. Flexeril 10 mg po TID prn.  Plavix 75 mg po daily Rocaltrol 0.40mcg po every 3 days.  Flonase 50 mcg 2 squirts each nares daily Voltaren gel 2 grams TID to right knee Robitussin DM 10 ml po qid. Anusol HC 25 mg one PR BID. Novolog 8 units TID ac. Insulin glargine 20 units daily.  Isordil 10 mg po bid Lamictal 25 mg po HS daily Linzess 145 mcg po daily Claritin 10 mg po daily  Tradjenta 5 mg po daily Ativan 0.5 mg po bid prn.  MMW 5 cc po qid swish and swallow.  22. Metformin 1000 mg po bid. 23. Lopressor 25 mg po TID 24. Singulair 10 mg po daily 25. MVI one po daily 26. NTG 0.4 mg one tab SL every 5 minutes X 3 prn CP 27. Protonix 40 mg po daily.  28. xycodone 10 mg po qid prn.  27. Senna S 2  tabs po daily 28. Zoloft 100 mg po daily 29. Crestor 20 mg po daily. 30. Ensure max po TID between meals.  31. Lamisil 1% to bilateral feet and areas between toes BID.  32. Zinc sulfate 220 mg po daily.  33. Compazine 5 mg- 10 mg po ever 4-6 hours prn nausea.    Diet: Heart healthy/Carb modified.   Special Instructions: Monitor blood sugar AC/HS. Use SSI per protocol. 2. Wean oxygen as tolerated to 2 L per [place].     Follow-up Information     Meredith Staggers, MD Follow up.   Specialty: Physical Medicine and Rehabilitation Why: No formal follow-up needed Contact information: [place]  [place] [address] [phone]         [name], MD Follow up.   Specialty: Cardiology Why: Call for appointment Contact information: [address] [place] [address] [phone]         [facility] [place] Follow up.   Specialty: Vascular Surgery Contact information: [place] [phone]                Signed: Bary Leriche 09/02/2021, 12:52 PM

## 2021-09-02 NOTE — Progress Notes (Signed)
Physical Therapy Session Note  Patient Details  Name: Ivoree Felmlee MRN: [id number] Date of Birth: [birth year]  Today's Date: 09/02/2021 PT Individual Time: 0800-0819 PT Individual Time Calculation (min): 19 min   Short Term Goals: Week 1:  PT Short Term Goal 1 (Week 1): STG = LTG due to LOS  Skilled Therapeutic Interventions/Progress Updates:    Pt received in bed and reporting that cardiology had placed on bed rest. No documentation noted on chart review, but pt is adamant about not being able to participate at this time. Therapist provided education and emotional support as pt became emotional about her condition. Pt remained in bed at this time and was left with all needs in reach and alarm active. Pt missed 51 min of scheduled therapy.   Therapy Documentation Precautions:  Precautions Precautions: Fall Precaution Comments: monitor sats/HR, post covid debility/deconditioned Restrictions Weight Bearing Restrictions: No General: PT Amount of Missed Time (min): 51 Minutes PT Missed Treatment Reason: MD hold (Comment);Patient unwilling to participate (per pt, no documentation of MD hold)     Therapy/Group: Individual Therapy  Mickel Fuchs 09/02/2021, 12:11 PM

## 2021-09-02 NOTE — Progress Notes (Signed)
ANTICOAGULATION CONSULT NOTE - Initial Consult  Pharmacy Consult for apixaban to heparin Indication: chest pain/ACS and atrial fibrillation  Allergies  Allergen Reactions   Tetracycline Hives    Patient tolerated Doxycycline Dec 2020   Niacin Other (See Comments)    Mouth blisters   Niaspan [Niacin Er] Other (See Comments)    Mouth blisters   Sulfa Antibiotics Nausea Only and Other (See Comments)    "Tears up stomach"   Sulfonamide Derivatives Other (See Comments)    Reaction: per patient "tears her stomach up"   Codeine Nausea And Vomiting   Erythromycin Nausea And Vomiting   Hydromorphone Hcl Nausea And Vomiting   Morphine And Related Nausea And Vomiting   Nalbuphine Nausea And Vomiting    Nubain   Sulfasalazine Nausea Only and Other (See Comments)    per patient "tears her stomach up", "Tears up stomach"   Tape Rash and Other (See Comments)    No "plastic" tape," please----cloth tape only    Patient Measurements:   Heparin Dosing Weight: 66kg  Vital Signs: Temp: 99.7 F (37.6 C) (01/17 1247) Temp Source: Oral (01/17 1247) BP: (P) 106/56 (01/17 1252) Pulse Rate: (P) 69 (01/17 1252)  Labs: Recent Labs    09/01/21 0345 09/01/21 1826 09/02/21 0447  HGB 8.6*  --  9.2*  HCT 28.4*  --  29.7*  PLT 137*  --  173  CREATININE 0.75  --   --   TROPONINIHS  --  52* 66*    Estimated Creatinine Clearance: 73.6 mL/min (by C-G formula based on SCr of 0.75 mg/dL).   Medical History: Past Medical History:  Diagnosis Date   Anemia    Anxiety    Bilateral carotid artery stenosis    Carotid duplex 04/6788: 3-81% LICA, 01-75% RICA, >10% RECA, f/u 1 yr suggested   CAD (coronary artery disease) of bypass graft 5/01; 3/'02, 8/'03, 10/'04; 1/15   PCI x 5 to SVG-D1    CAD in native artery 07/1993   3 Vessel Disease (LAD-D1 & RCA) -- CABG (Dx in setting of inferior STEMI-PTCA of RCA)   CAD S/P percutaneous coronary angioplasty    PCI to SVG-D1 insertion/native D1 x 4 = '01  -(S660 BMS 2.5 x 9 anastomosis- D1); '02 - distal overlap ACS Pixel 2.5 x 8  BMS; '03 distal/native ISR/Thrombosis - Pixel 2.5 x 13; '04 - ISR-  Taxus 2.5 x 20 (covered all);; 1/15 - mid SVG-D1 (50% distal ISR) - Promus P 2.75 x 20 -- 2.8 mm; 6/22: Extensive ISR PTCA & Anastomotic-Native Diag DES PCI ([place] Onyx 2.25x12 (2.75-2.5 mm post-dilation   COPD mixed type ([place])    Followed by Dr. Lamonte Sakai "pulmonologist said no COPD"   Depression with anxiety    Diabetes mellitus type 2 in obese ([place])    Diarrhea    started after cholecystectomy and mass removed from intestine   Dyslipidemia, goal LDL below 70    08/2012: TC 137, TG 200, HDL 32!, LDL 45; on statin (followed by Dr.Deterding)   ESRD (end stage renal disease) ([place]) 1991   s/p Cadaveric Renal Transplant ([facility] - Dr. Jimmy Footman)    Family history of adverse reaction to anesthesia    mom's bp dropped during/after anesthesia   Fibromyalgia    GERD (gastroesophageal reflux disease)    Glomerulonephritis, chronic, rapidly progressive 1989   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 07/1993   Rescue PTCA of RCA -- referred for CABG.   H/O: GI bleed  Headache    migraines in the past   History of CABG x 3 08/1993   Dr. 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  Had having having lots of nausea and vomiting with poor p.o. intake.  Thought to have QT prolongation with multiple medications involved and hypomagnesemia, hypokalemia.  Tikosyn discontinued along with Zoloft and Phenergan.   Hypertension associated with diabetes ([place])    Mild aortic stenosis by prior echocardiogram 07/2019   Echo:  Mild aortic stenosis (gradients: Mean 14.3 mmHg -peak 24.9 mmHg).   Morbid obesity ([facility])    MRSA (methicillin resistant staph aureus) culture  positive    OSA (obstructive sleep apnea)    no longer on CPAP or home O2, states she doesn't need now after lap band   PAD (peripheral artery disease) ([place]) 08/2013   LEA Dopplers to be read by Dr. Fletcher Anon   PAF (paroxysmal atrial fibrillation) ([place]) 06/2014   Noted on CardioNet Monitor  - --> rhythm control with Tikosyn (Dr. Rayann Heman); converted from warfarin to apixaban for anticoagulation.   Pneumonia    Recurrent boils    Bilateral Groin   Rheumatoid arthritis ([place])    Per Patient Report; associated with OA   S/p cadaver renal transplant 1991   [facility]   Torsades de pointes 05/12/2021   Unstable angina ([place]) 5/01; 3/'02, 8/'03, 10/'04; 1/15   x 5 occurences since Inf-Post STEMI in 1994   Assessment: 66 year old female previously on inpatient rehab now admitted to progressive unit d/t multiple medical issues. Cardiology consulted yesterday due to new chest pain and afib with rvr. Patient has been on apixaban since 1/6. New orders to transition back to IV heparin with plans for cath on Friday.   Current hemoglobin 8.6, plt 137. No bleeding issues have been noted. Apixaban given this am.   Goal of Therapy:  Heparin level 0.3-0.7 units/ml aPTT 66-102 seconds Monitor platelets by anticoagulation protocol: Yes   Plan:  Start heparin infusion at 700 units/hr Check anti-Xa level and aptt in 8 hours and daily while on heparin until the two levels correlate Continue to monitor H&H and platelets  Erin Hearing PharmD., BCPS Clinical Pharmacist 09/02/2021 2:23 PM

## 2021-09-02 NOTE — Progress Notes (Signed)
Pt arrived back from testing, pt transferred to [phone]

## 2021-09-02 NOTE — H&P (Addendum)
History and Physical    Katherine [name] PYY:[id number] DOB: [birth year] DOA: 09/02/2021  Referring MD/NP/PA: Reesa Chew, PA-C PCP: [name], MD  Patient coming from: Transfer from inpatient rehab  Chief Complaint: Right leg pain  I have personally briefly reviewed patient's old medical records in [facility]   HPI: Katherine [name] is a 66 y.o. 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  She had been admitted on 07/25/2021-08/25/2021, for COVID with MRSA pneumonia.  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  Since the cardiac cath was attempted patient reports that she has had right leg pain at rest. Reported that it became severe and was unrelieved with pain medications of oxycodone which she is normally on for chronic back pain at baseline.  Patient was noted to have monophasic pulses, right ABI 0.76, and left noncompressible.  She has reported some relief with ice packs to right leg and knee, but had been getting weaker.  She has been unable to participate in therapies because anytime she got up she started developing chest pain and/or had gone into atrial fibrillation.  Vascular surgery and cardiology have evaluated the patient yesterday.  Vascular surgery had noted possible plans of bilateral common femoral endarterectomies with stenting and/or bypass grafting.  Cardiology noted patient would be high risk for any open and even percutaneous intervention.  Adjustments were made to including discontinuing metoprolol succinate and changing to metoprolol tartrate 25 mg tid and  adding  isosorbide dinitrate 10 mg twice daily. TRH was called to admit back to inpatient side due to patient's multiple comorbidities and possible need of procedure.  ED Course: As noted above  Review of Systems  Constitutional:  Positive for malaise/fatigue. Negative for fever.  Eyes:  Negative for pain.  Cardiovascular:  Positive for chest pain and palpitations.  Gastrointestinal:  Negative for blood in stool and vomiting.  Musculoskeletal:  Positive for myalgias. Negative for back pain and falls.  Skin:  Negative for rash.  Neurological:  Negative for loss of consciousness.    Past Medical History:  Diagnosis Date   Anemia    Anxiety    Bilateral carotid artery stenosis    Carotid duplex 10/5668: 1-41% LICA, 03-01% RICA, >31% RECA, f/u 1 yr suggested   CAD (coronary artery disease) of bypass graft 5/01; 3/'02, 8/'03, 10/'04; 1/15   PCI x 5 to SVG-D1    CAD in native artery 07/1993   3 Vessel Disease (LAD-D1 & RCA) -- CABG (Dx in setting of inferior STEMI-PTCA of RCA)   CAD S/P percutaneous coronary angioplasty    PCI to SVG-D1 insertion/native D1 x 4 = '01 -(S660 BMS 2.5 x 9 anastomosis- D1); '02 - distal overlap ACS Pixel 2.5 x 8  BMS; '03 distal/native ISR/Thrombosis - Pixel 2.5 x 13; '04 - ISR-  Taxus 2.5 x 20 (covered all);; 1/15 - mid SVG-D1 (50% distal ISR) - Promus P 2.75 x 20 -- 2.8 mm; 6/22: Extensive ISR PTCA & Anastomotic-Native Diag DES PCI (Frontier Onyx 2.25x12 (2.75-2.5 mm post-dilation   COPD mixed type ([place])    Followed by Dr. Lamonte Sakai "pulmonologist said no COPD"  Depression with anxiety    Diabetes mellitus type 2 in obese ([place])    Diarrhea    started after cholecystectomy and mass removed from intestine   Dyslipidemia, goal LDL below 70    08/2012: TC 137, TG 200, HDL 32!, LDL 45; on statin (followed by Dr.Deterding)   ESRD (end stage renal disease) ([place]) 1991   s/p Cadaveric Renal Transplant [facility] - Dr. Jimmy Footman)    Family history of adverse reaction to anesthesia     mom's bp dropped during/after anesthesia   Fibromyalgia    GERD (gastroesophageal reflux disease)    Glomerulonephritis, chronic, rapidly progressive 1989   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 07/1993   Rescue PTCA of RCA -- referred for CABG.   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  Had having having lots of nausea and vomiting with poor p.o. intake.  Thought to have QT prolongation with multiple medications involved and hypomagnesemia, hypokalemia.  Tikosyn discontinued along with Zoloft and Phenergan.   Hypertension associated with diabetes ([place])    Mild aortic stenosis by prior echocardiogram 07/2019   Echo:  Mild aortic stenosis (gradients: Mean 14.3 mmHg -peak 24.9 mmHg).   Morbid obesity ([facility])    MRSA (methicillin resistant staph aureus) culture positive    OSA (obstructive sleep apnea)    no longer on CPAP or home O2, states she doesn't need now after lap band   PAD (peripheral artery disease) ([place]) 08/2013   LEA Dopplers to be read by Dr. Fletcher Anon   PAF (paroxysmal atrial fibrillation) ([place]) 06/2014   Noted on CardioNet Monitor  - --> rhythm control with Tikosyn (Dr. Rayann Heman); converted from warfarin to apixaban for anticoagulation.   Pneumonia    Recurrent boils    Bilateral Groin   Rheumatoid arthritis ([place])    Per Patient Report; associated with OA   S/p cadaver renal transplant 1991   [facility]   Torsades de pointes 05/12/2021   Unstable angina ([place]) 5/01; 3/'02, 8/'03, 10/'04; 1/15   x 5 occurences since Inf-Post STEMI in 1994    Past Surgical History:  Procedure Laterality Date   ABDOMINAL AORTOGRAM N/A 04/21/2018   Procedure: ABDOMINAL AORTOGRAM;  Surgeon: [name], MD;  Location: [facility];  Service: Cardiovascular;  Laterality: N/A;   CATHETER REMOVAL     CHOLECYSTECTOMY N/A 10/29/2014   Procedure: LAPAROSCOPIC CHOLECYSTECTOMY WITH INTRAOPERATIVE CHOLANGIOGRAM;  Surgeon: Excell Seltzer, MD;  Location: [facility];  Service: General;  Laterality: N/A;   CORONARY ANGIOPLASTY  1994   x5   CORONARY ARTERY BYPASS GRAFT  1995   LIMA-LAD, SVG-RPDA, SVG-D1   CORONARY STENT INTERVENTION N/A 02/20/2021   Procedure: PERCUTANEOUS CORONARY STENT INTERVENTION;  Surgeon: [name], MD;  [facility]; ostLCx 60% (Neg RFR 0.96);; SVG- D2 recurrent 90% ISR & 95% native D2 after graft-> DES PCI of 95% anastomotic D2 lesion (Onyx Frontier 2.25 mm x 12 mm => 2.75 mm @ overlap, 2.5 distal.);PTCA of ISR in body of graft. ->  2.5 mm scoring balloon & post-dil w/ 2.75 mm [employer] balloon   ESOPHAGOGASTRODUODENOSCOPY N/A 10/15/2016   Procedure:  ESOPHAGOGASTRODUODENOSCOPY (EGD);  Surgeon: Wilford Corner, MD;  Location: [facility];  Service: Endoscopy;  Laterality: N/A;   I & D EXTREMITY Right 01/29/2018   Procedure: IRRIGATION AND DEBRIDEMENT THUMB;  Surgeon: Dayna Barker, MD;  Location: [facility];  Service: Plastics;  Laterality: Right;   INCISE AND DRAIN ABCESS     INTRAVASCULAR PRESSURE WIRE/FFR STUDY N/A 02/20/2021   Procedure: INTRAVASCULAR PRESSURE WIRE/FFR STUDY;  Surgeon: [name], MD;  Location: [place] [facility];  Service: Cardiovascular;  Laterality: N/A;   KIDNEY TRANSPLANT  1991   KNEE ARTHROSCOPY WITH LATERAL MENISECTOMY Left 12/03/2017   Procedure: LEFT KNEE ARTHROSCOPY WITH LATERAL MENISECTOMY;  Surgeon: Earlie Server, MD;  Location: [facility];  Service: Orthopedics;  Laterality: Left;   LAPAROSCOPIC GASTRIC BANDING  04/2004; 10/'09, 2/'10   Port Replacement x 2   LEFT HEART CATH AND CORONARY ANGIOGRAPHY N/A 02/20/2021   Procedure: LEFT HEART CATH AND CORONARY ANGIOGRAPHY;  Surgeon: [name], MD;  Location: [place] [facility];  Service: Cardiovascular;  Laterality: N/A;   LEFT HEART CATH AND CORS/GRAFTS ANGIOGRAPHY N/A 04/21/2018   Procedure: LEFT HEART CATH AND CORS/GRAFTS ANGIOGRAPHY;  Surgeon: [name], MD;  Location: [place] [facility];  Ost-Prox LAD 50% - proxLAD (pre & post D1) 100% CTO. Cx - patent, small OM1 (stable ~ ostial OM1 90%, too small for PCI) & 2 small LPL; Ost-distal RCA 100% CTO.  LIMA-LAD (not injected); SVG-dRCA patent, SVG-D1 - insertion stent ~20% ISR - Severe R CFA disease w/ focal Sub TO   LEFT HEART CATH AND CORS/GRAFTS ANGIOGRAPHY  5/'01, 3/'02, 8/'03, 10/'04; 1/'15   08/22/2013: LAD & RCA 100%; LIMA-LAD & SVG-rPDA patent; Cx-- OM1 60%, OM2 ostial ~50%; SVG-D1 - 80% mid, 50% distal ISR --PCI   LEFT HEART CATH AND CORS/GRAFTS ANGIOGRAPHY N/A 01/31/2021   Procedure: LEFT HEART CATH AND CORS/GRAFTS ANGIOGRAPHY;  Surgeon: Jolaine Artist, MD;  [facility];;   LEFT HEART CATH AND CORS/GRAFTS ANGIOGRAPHY N/A 08/21/2021   Procedure: LEFT HEART CATH AND CORS/GRAFTS ANGIOGRAPHY;  Surgeon: Belva Crome, MD;  Location: [facility];  Service: Cardiovascular;  Laterality: N/A;   LEFT HEART CATHETERIZATION WITH CORONARY/GRAFT ANGIOGRAM N/A 08/23/2013   Procedure: LEFT HEART CATHETERIZATION WITH Beatrix Fetters;  Surgeon: Wellington Hampshire, MD;  Location: [facility] C[facility] LAB;  Service: Cardiovascular;  Laterality: N/A;   Lower Extremity Arterial Dopplers  08/2013   ABI: R 0.96, L 1.04   MULTIPLE TOOTH EXTRACTIONS  age 50   NM MYOVIEW LTD  03/2016   EF 62%. LOW RISK. C/W prior MI - no Ischemia. Apical hypokinesis.   PERCUTANEOUS CORONARY STENT INTERVENTION (PCI-S)  5/'01, 3/'02, 8/'03, 10/'04;   '01 - S660 BMS 2.5 x 9 - dSVG-D1 into D1; '02- post-stent stenosis - 2.5 x 8 Pixel BMS; '8\03: ISR/Thrombosis into native D1 - AngioJet, 2.5 x 13 Pixel; '04 - ISR 95% - covered stented area with Taxus DES 2.5 mm x 20 (2.88)   PERCUTANEOUS CORONARY STENT INTERVENTION  (PCI-S)  08/23/2013   Procedure: PERCUTANEOUS CORONARY STENT INTERVENTION (PCI-S);  Surgeon: Wellington Hampshire, MD;  Location[facility] CATH LAB;  Service: Cardiovascular;;mid SVG-D1 80%; distal stent ~50% ISR; Promus [place] DES 2.75 mm xc 20 mm (2.8 mm)   PORT-A-CATH REMOVAL     kidney   TRANSTHORACIC ECHOCARDIOGRAM  07/2019   a) 07/2019: EF 55 to 60%.  No LVH.  Paradoxical septal WM-s/p CABG.  GRII DD.  Nl RV size  and fxn.  Mild bilateral atrial dilation.  Mod MAC.  Trace MR.  Mild AS (gradients: Mean 14.3 mmHg -peak 24.9 mmHg).; B) 06/2020: EF 40 to 45%.  Moderate concentric LVH.  GRII DD.  Elevated LAP.  Mod HK mid Apical Ant-AntSept wall & mild Apical Dyskinesis.  Mod LA dilation.  Mild MR.  AoV sclerosis w/o AS.   TRANSTHORACIC ECHOCARDIOGRAM  01/30/2021   EF 55 to 60%.  Mild LVH.  GR 1 DD.  Elevated LAP.  Moderate LA dilation.  Mild MR with mild MS.  Mild aortic valve stenosis.:   TUBAL LIGATION     wrist fistula repair Left    dialysis for one year     reports that she has been smoking cigarettes. She has a 30.00 pack-year smoking history. She has never used smokeless tobacco. She reports that she does not drink alcohol and does not use drugs.  Allergies  Allergen Reactions   Tetracycline Hives    Patient tolerated Doxycycline Dec 2020   Niacin Other (See Comments)    Mouth blisters   Niaspan [Niacin Er] Other (See Comments)    Mouth blisters   Sulfa Antibiotics Nausea Only and Other (See Comments)    "Tears up stomach"   Sulfonamide Derivatives Other (See Comments)    Reaction: per patient "tears her stomach up"   Codeine Nausea And Vomiting   Erythromycin Nausea And Vomiting   Hydromorphone Hcl Nausea And Vomiting   Morphine And Related Nausea And Vomiting   Nalbuphine Nausea And Vomiting    Nubain   Sulfasalazine Nausea Only and Other (See Comments)    per patient "tears her stomach up", "Tears up stomach"   Tape Rash and Other (See Comments)    No "plastic" tape,"  please----cloth tape only    Family History  Problem Relation Age of Onset   Cancer Mother        liver   Heart disease Father    Cancer Father        colon   Arrhythmia Brother        Atrial Fibrillation   Arrhythmia Paternal Aunt        Atrial Fibrillation    Prior to Admission medications   Medication Sig Start Date End Date Taking? Authorizing Provider  acetaminophen (TYLENOL) 500 MG tablet Take 1,000 mg by mouth daily.    [provider]  albuterol (VENTOLIN HFA) 108 (90 Base) MCG/ACT inhaler TAKE 2 PUFFS BY MOUTH EVERY 6 HOURS AS NEEDED FOR WHEEZE OR SHORTNESS OF BREATH Patient taking differently: 2 puffs every 6 (six) hours as needed for wheezing or shortness of breath. 02/10/21   Byrum, Rose Fillers, MD  apixaban (ELIQUIS) 5 MG TABS tablet Take 1 tablet (5 mg total) by mouth 2 (two) times daily. APPOINTMENT NEEDED WITH CARDIOLOGIST FOR FURTHER REFILLS Patient taking differently: Take 5 mg by mouth 2 (two) times daily. 04/17/21   Sherran Needs, NP  arformoterol (BROVANA) 15 MCG/2ML NEBU Take 2 mLs (15 mcg total) by nebulization 2 (two) times daily. 08/30/19   Collene Gobble, MD  azaTHIOprine (IMURAN) 50 MG tablet Take 125 mg by mouth See admin instructions. Take 2 1/2 tablets (125 mg) by mouth daily at 3pm    [provider]  budesonide (PULMICORT) 0.5 MG/2ML nebulizer solution Take 2 mLs (0.5 mg total) by nebulization 2 (two) times daily. 08/24/19   [name], MD  calcitRIOL (ROCALTROL) 0.25 MCG capsule Take 0.25 mcg by mouth every 3 (three) days.  [provider]  cetirizine (ZYRTEC) 5 MG tablet Take 5 mg by mouth daily.    [provider]  clopidogrel (PLAVIX) 75 MG tablet Take 1 tablet (75 mg total) by mouth daily. 02/22/21   Cheryln Manly, NP  clotrimazole (MYCELEX) 10 MG troche Take 1 tablet (10 mg total) by mouth 5 (five) times daily as needed (thrush). Patient not taking: Reported on 06/19/2021 02/03/21   Edwin Dada, MD   cyclobenzaprine (FLEXERIL) 10 MG tablet Take 1 tablet (10 mg total) by mouth 3 (three) times daily as needed for muscle spasms. 08/25/21   Kathie Dike, MD  fluticasone (FLONASE) 50 MCG/ACT nasal spray PLACE 2 SPRAYS INTO BOTH NOSTRILS 2 TIMES DAILY. Patient taking differently: 2 sprays 2 (two) times daily as needed for allergies. 06/27/21   Collene Gobble, MD  glimepiride (AMARYL) 4 MG tablet Take 4 mg by mouth daily. 02/03/20   [provider]  hydrocortisone (ANUSOL-HC) 25 MG suppository Place 1 suppository (25 mg total) rectally 2 (two) times daily. 08/25/21   Kathie Dike, MD  insulin aspart (NOVOLOG FLEXPEN) 100 UNIT/ML FlexPen Inject 4-10 Units into the skin 3 (three) times daily with meals. Per sliding scale :not provided    [provider]  Insulin Glargine (BASAGLAR KWIKPEN) 100 UNIT/ML Inject 20 Units into the skin daily before breakfast. 08/25/21   Kathie Dike, MD  lamoTRIgine (LAMICTAL) 25 MG tablet TAKE 1 TABLET BY MOUTH EVERYDAY AT BEDTIME Patient taking differently: Take 25 mg by mouth at bedtime. 07/25/21   Meredith Staggers, MD  linaclotide [facility]) 145 MCG CAPS capsule Take 1 capsule (145 mcg total) by mouth daily before breakfast. 08/25/21   Kathie Dike, MD  LORazepam (ATIVAN) 1 MG tablet Take 0.5 tablets (0.5 mg total) by mouth 2 (two) times daily as needed for anxiety. 08/25/21   Kathie Dike, MD  magnesium oxide (MAG-OX) 400 (240 Mg) MG tablet Take 1 tablet (400 mg total) by mouth daily. Patient not taking: Reported on 05/07/2021 04/23/21   Flora Lipps, MD  metFORMIN (GLUCOPHAGE) 500 MG tablet Take 1 tablet (500 mg total) by mouth 2 (two) times daily. 02/22/21   Burnell Blanks, MD  metoprolol succinate (TOPROL XL) 25 MG 24 hr tablet Take 0.5 tablets (12.5 mg total) by mouth daily. 08/25/21 08/25/22  Kathie Dike, MD  montelukast (SINGULAIR) 10 MG tablet TAKE 1 TABLET BY MOUTH EVERYDAY AT BEDTIME Patient taking differently: Take 10 mg by mouth  at bedtime. 06/23/21   Collene Gobble, MD  nitroGLYCERIN (NITROSTAT) 0.4 MG SL tablet Place 1 tablet (0.4 mg total) under the tongue every 5 (five) minutes as needed for chest pain. 02/21/21   Burnell Blanks, MD  nystatin (MYCOSTATIN/NYSTOP) powder Apply topically 3 (three) times daily. 08/25/21   Kathie Dike, MD  Oxycodone HCl 10 MG TABS Take 1 tablet (10 mg total) by mouth 4 (four) times daily as needed. Patient taking differently: Take 10 mg by mouth 4 (four) times daily as needed (pain). 07/24/21   Bayard Hugger, NP  OXYGEN Inhale 3 L into the lungs continuous.    [provider]  pantoprazole (PROTONIX) 40 MG tablet TAKE 1 TABLET BY MOUTH EVERY DAY Patient taking differently: 40 mg daily. 07/31/21   [name], MD  polyethylene glycol (MIRALAX / GLYCOLAX) 17 g packet Take 17 g by mouth 2 (two) times daily. 08/25/21   Kathie Dike, MD  predniSONE (DELTASONE) 10 MG tablet Take 13m po daily for 3 days  then 44m po daily for 3 days then 567mdaily 08/25/21   MeKathie DikeMD  promethazine (PHENERGAN) 25 MG tablet Take 25 mg by mouth every 6 (six) hours as needed for nausea or vomiting.    [provider]  Respiratory Therapy Supplies (NEBULIZER) DEVI Pt needs nebulizr machine dx J44.9 07/17/21   ByCollene GobbleMD  rosuvastatin (CRESTOR) 20 MG tablet TAKE 1 TABLET BY MOUTH EVERY DAY Patient taking differently: Take 20 mg by mouth daily. 06/16/21   HaLeonie ManMD  senna-docusate (SENOKOT-S) 8.6-50 MG tablet Take 2 tablets by mouth 2 (two) times daily. 08/25/21   MeKathie DikeMD  sertraline (ZOLOFT) 100 MG tablet Take 1 tablet (100 mg total) by mouth daily. 06/06/21   HaLeonie ManMD    Physical Exam:  Constitutional: obese chronically ill appearing female who is in no acute distress Vitals:   09/02/21 1252  BP: (!) (P) 106/56  Pulse: (P) 69  Resp: (!) 25  SpO2: (P) 94%   Eyes: PERRL, lids and conjunctivae normal ENMT: Mucous membranes are  moist.  Neck: normal, supple,  Respiratory: normal respiratory effort on 3 L of Nasal cannula without wheezes appreciated. Cardiovascular: Regular rate and rhythm, no murmur. pedal pulses not appreciated. Left wrist fistula without thrill Abdomen: no tenderness, no masses palpated.Bowel sounds positive.  Musculoskeletal: n No joint deformity upper and lower extremities. Good ROM, no contractures. Normal muscle tone.  Skin: no rashes, lesions, ulcers. No induration Neurologic: CN 2-12 grossly intact. Sensation intact, DTR normal. Strength 5/5 in all 4.  Psychiatric: Normal judgment and insight. Alert and oriented x 3. N    Labs on Admission: I have personally reviewed following labs and imaging studies  CBC: Recent Labs  Lab 09/01/21 0345 09/02/21 0447  WBC 5.4 7.0  HGB 8.6* 9.2*  HCT 28.4* 29.7*  MCV 104.8* 103.8*  PLT 137* 17893 Basic Metabolic Panel: Recent Labs  Lab 09/01/21 0345  NA 135  K 4.0  CL 101  CO2 28  GLUCOSE 284*  BUN 23  CREATININE 0.75  CALCIUM 8.8*   GFR: Estimated Creatinine Clearance: 73.6 mL/min (by C-G formula based on SCr of 0.75 mg/dL). Liver Function Tests: No results for input(s): AST, ALT, ALKPHOS, BILITOT, PROT, ALBUMIN in the last 168 hours. No results for input(s): LIPASE, AMYLASE in the last 168 hours. No results for input(s): AMMONIA in the last 168 hours. Coagulation Profile: No results for input(s): INR, PROTIME in the last 168 hours. Cardiac Enzymes: No results for input(s): CKTOTAL, CKMB, CKMBINDEX, TROPONINI in the last 168 hours. BNP (last 3 results) No results for input(s): PROBNP in the last 8760 hours. HbA1C: No results for input(s): HGBA1C in the last 72 hours. CBG: Recent Labs  Lab 09/01/21 1152 09/01/21 1627 09/01/21 2156 09/02/21 0616 09/02/21 1153  GLUCAP 244* 211* 230* 243* 255*   Lipid Profile: No results for input(s): CHOL, HDL, LDLCALC, TRIG, CHOLHDL, LDLDIRECT in the last 72 hours. Thyroid Function  Tests: No results for input(s): TSH, T4TOTAL, FREET4, T3FREE, THYROIDAB in the last 72 hours. Anemia Panel: No results for input(s): VITAMINB12, FOLATE, FERRITIN, TIBC, IRON, RETICCTPCT in the last 72 hours. Urine analysis:    Component Value Date/Time   COLORURINE YELLOW 08/04/2021 0746   APPEARANCEUR CLEAR 08/04/2021 0746   LABSPEC 1.016 08/04/2021 0746   PHURINE 6.0 08/04/2021 0746   GLUCOSEU NEGATIVE 08/04/2021 0746   GLUCOSEU 100 (A) 08/13/2020 1550   HGBUR NEGATIVE 08/04/2021 0746   BILIRUBINUR  NEGATIVE 08/04/2021 0746   KETONESUR NEGATIVE 08/04/2021 0746   PROTEINUR 100 (A) 08/04/2021 0746   UROBILINOGEN 0.2 08/13/2020 1550   NITRITE NEGATIVE 08/04/2021 0746   LEUKOCYTESUR SMALL (A) 08/04/2021 0746   Sepsis Labs: No results found for this or any previous visit (from the past 240 hour(s)).   Radiological Exams on Admission: DG Knee 1-2 Views Right  Result Date: 09/01/2021 CLINICAL DATA:  Sharp right knee pain, no injury. EXAM: RIGHT KNEE - 1-2 VIEW COMPARISON:  None. FINDINGS: Lateral view is slightly rotated. No definite joint effusion. No fracture. Chondrocalcinosis in the lateral compartment. Scattered surgical clips in the soft tissues. IMPRESSION: 1. No acute findings. 2. Chondrocalcinosis in the lateral compartment of the knee. Electronically Signed   By: Lorin Picket M.D.   On: 09/01/2021 11:41   DG CHEST PORT 1 VIEW  Result Date: 09/02/2021 CLINICAL DATA:  Short of breath EXAM: PORTABLE CHEST 1 VIEW COMPARISON:  None. FINDINGS: Sternotomy wires overlie normal cardiac silhouette. Patient rotated leftward. Diffuse patchy airspace opacities are similar comparison exam. No pleural fluid. No pneumothorax. IMPRESSION: No change in diffuse patchy bilateral airspace disease consistent with multifocal pneumonia versus pulmonary edema. Electronically Signed   By: Suzy Bouchard M.D.   On: 09/02/2021 09:25   VAS US CAROTID  Result Date: 09/02/2021 Carotid Arterial Duplex  Study Patient Name:  DELVA DERDEN  Date of Exam:   09/02/2021 Medical Rec #: [id number]           Accession #:    [id number] Date of Birth: 03-24-1956           Patient Gender: F Patient Age:   23 years Exam Location[facility] Procedure:      VAS US CAROTID Referring Phys: Jenkins Rouge --------------------------------------------------------------------------------  Risk Factors:      Hypertension, hyperlipidemia, Diabetes, current smoker,                    coronary artery disease. Limitations        Today's exam was limited due to the body habitus of the                    patient and patient position. Comparison Study:  No prior study Performing Technologist: Maudry Mayhew MHA, RDMS, RVT, RDCS  Examination Guidelines: A complete evaluation includes B-mode imaging, spectral Doppler, color Doppler, and power Doppler as needed of all accessible portions of each vessel. Bilateral testing is considered an integral part of a complete examination. Limited examinations for reoccurring indications may be performed as noted.  Right Carotid Findings: +----------+--------+--------+--------+-------------------------+---------+             PSV cm/s EDV cm/s Stenosis Plaque Description        Comments   +----------+--------+--------+--------+-------------------------+---------+  CCA Prox   52       20                                                     +----------+--------+--------+--------+-------------------------+---------+  CCA Distal 55       12                calcific                  Shadowing  +----------+--------+--------+--------+-------------------------+---------+  ICA Prox   193  57       40-59%   heterogenous and calcific Shadowing  +----------+--------+--------+--------+-------------------------+---------+  ICA Distal 75       17                                                     +----------+--------+--------+--------+-------------------------+---------+  ECA        93       5                                                       +----------+--------+--------+--------+-------------------------+---------+ +----------+--------+-------+--------------+-------------------+             PSV cm/s EDV cms Describe       Arm Pressure (mmHG)  +----------+--------+-------+--------------+-------------------+  Subclavian                  Not identified                      +----------+--------+-------+--------------+-------------------+ +---------+--------+--------+--------------+  Vertebral PSV cm/s EDV cm/s Not identified  +---------+--------+--------+--------------+  Left Carotid Findings: +----------+--------+--------+--------+------------------------------+---------+             PSV cm/s EDV cm/s Stenosis Plaque Description             Comments   +----------+--------+--------+--------+------------------------------+---------+  CCA Prox   95       19                                                          +----------+--------+--------+--------+------------------------------+---------+  CCA Distal 77       24                                                          +----------+--------+--------+--------+------------------------------+---------+  ICA Prox   151      41       40-59%   heterogenous, irregular and    Shadowing                                         calcific                                  +----------+--------+--------+--------+------------------------------+---------+  ICA Distal 137      38                                                          +----------+--------+--------+--------+------------------------------+---------+  ECA        140  34                heterogenous and calcific      shadowing  +----------+--------+--------+--------+------------------------------+---------+ +----------+--------+--------+--------------+-------------------+             PSV cm/s EDV cm/s Describe       Arm Pressure (mmHG)  +----------+--------+--------+--------------+-------------------+  Subclavian                    Not identified                      +----------+--------+--------+--------------+-------------------+ +---------+--------+--+--------+--+---------+  Vertebral PSV cm/s 56 EDV cm/s 20 Antegrade  +---------+--------+--+--------+--+---------+   Summary: Right Carotid: Velocities in the right ICA are consistent with a 40-59%                stenosis. Left Carotid: Velocities in the left ICA are consistent with a 40-59% stenosis. Vertebrals:  Left vertebral artery demonstrates antegrade flow. Right vertebral              artery was not visualized. 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 *See table(s) above for measurements and observations.     Preliminary     EKG: Independently reviewed.  Accelerated junctional rhythm at 70 bpm  Assessment/Plan Peripheral artery disease: Patient presents with complaints of right leg pain following attempts at heart catheterization from 1/5.  ABI on the right leg 0.76.  Vascular surgery evaluated and plan for possible attempt at intervention on 1/20.  Cardiology had noted patient would be high risk for any intervention given multiple comorbidities. -Admit to a medical telemetry bed -Transition to heparin drip for per pharmacy -Continue Plavix and statin -Appreciate vascular surgery consultative services, we will follow-up for any further recommendations  Chronic respiratory failure with hypoxia  COPD -Continue  nasal cannula oxygen to maintain O2 saturations > 92% - breathing tx as needed   Combined systolic and diastolic CHF: Last EF noted to be 45 -50%. No significant JVP on physical exam. CHest xray note concern for pna vs. edema. -Strict I&Os and daily weights -Check BNP -Furosemide 40 mg IV x1 dose. Continue to monitor   Paroxysmal atrial fibrillation on chronic anticoagulation: Currently in sinus rhythm  -Hold Eliquis -Transition to heparin for possible need of procedure -Cardiology change patient from metoprolol succinate  to tartrate  Chest pain  Coronary artery disease: Patient with prior history of CABG and during prior hospitalization was noted to have NSTEMI.  However cardiac cath attempted on 1/5 unable to be performed due to inability to obtain vascular access.  Cardiology had recommended medical management on reattempt unless patient had signs of STEMI -continue Plavix, statin, imdur  Diabetes mellitus type 2: [id number] -Hypoglycemic protocols -Hold metformin due to possible need of procedure -Continue Tradjenta -Reduced NovoLog to 5 units 3 times daily with meals -Discontinue bedtime insulin  Hyperlipidemia -Continue Crestor  History of renal transplant: Back in 1991.  Patient is followed by Dr. Johnney Ou in the outpatient setting. Kidney function stable -Continue prednisone and Imuran  History of COVID-19 and MRSA pneumonia: treated during last hospitalization in 07/2021.  Generalized weakness/debility -PT/OT to eval and treat  Obesity: BMI 38.36 kg/m2  DVT prophylaxis: Heparin Code Status: DNR Family Communication: none  Disposition Plan: To be determined Consults called: Vascular surgery  Admission status: Inpatient  Norval Morton MD [facility]   If 7PM-7AM, please contact night-coverage   09/02/2021, 1:59 PM

## 2021-09-02 NOTE — Progress Notes (Signed)
Progress Note  Patient Name: Katherine [name] Date of Encounter: 09/02/2021  [facility] Cardiologist: Katherine Hew, MD   Subjective   We were asked to see Katherine [name] yesterday evening in consultation because of chest pain.  She is pain-free this morning.  Inpatient Medications    Scheduled Meds:  (feeding supplement) PROSource Plus  30 mL Oral BID BM   apixaban  5 mg Oral BID   arformoterol  15 mcg Nebulization BID   vitamin C  500 mg Oral Daily   azaTHIOprine  125 mg Oral Daily   budesonide  2 mL Nebulization BID   calcitRIOL  0.25 mcg Oral Q3 days   Chlorhexidine Gluconate Cloth  6 each Topical Daily   clopidogrel  75 mg Oral Daily   diclofenac Sodium  2 g Topical TID   fluticasone  2 spray Each Nare Daily   gabapentin  100 mg Oral QHS   guaiFENesin-dextromethorphan  10 mL Oral QID   hydrocerin   Topical BID   hydrocortisone  25 mg Rectal BID   insulin aspart  0-5 Units Subcutaneous QHS   insulin aspart  0-9 Units Subcutaneous TID WC   insulin aspart  8 Units Subcutaneous TID WC   insulin glargine-yfgn  20 Units Subcutaneous Daily   isosorbide dinitrate  10 mg Oral BID   lamoTRIgine  25 mg Oral QHS   linaclotide  145 mcg Oral QAC breakfast   linagliptin  5 mg Oral Daily   loratadine  10 mg Oral Daily   magic mouthwash  5 mL Oral QID   metFORMIN  1,000 mg Oral Q breakfast   metoprolol tartrate  25 mg Oral TID   montelukast  10 mg Oral QHS   multivitamin with minerals  1 tablet Oral Daily   nutrition supplement (JUVEN)  1 packet Oral BID BM   nystatin   Topical TID   pantoprazole  40 mg Oral Daily   polyethylene glycol  17 g Oral TID   [START ON 09/03/2021] predniSONE  5 mg Oral Q breakfast   Ensure Max Protein  11 oz Oral TID BM   rosuvastatin  20 mg Oral Daily   senna-docusate  2 tablet Oral BID   sertraline  100 mg Oral Daily   sodium chloride flush  10-40 mL Intracatheter Q12H   terbinafine   Topical BID   zinc sulfate  220 mg Oral Daily    Continuous Infusions:  PRN Meds: acetaminophen, alum & mag hydroxide-simeth, bisacodyl, cyclobenzaprine, diphenhydrAMINE, guaiFENesin-dextromethorphan, hydrALAZINE, ipratropium-albuterol, LORazepam, melatonin, menthol-cetylpyridinium, morphine injection, nitroGLYCERIN, oxyCODONE, polyethylene glycol, prochlorperazine **OR** prochlorperazine **OR** prochlorperazine, sodium chloride, sodium chloride flush, sodium phosphate   Vital Signs    Vitals:   09/01/21 1940 09/01/21 2020 09/02/21 0500 09/02/21 0821  BP:  (!) 131/52    Pulse:  93  90  Resp:  15  16  Temp:  98.6 F (37 C)    TempSrc:  Oral    SpO2: 96% 97%  92%  Weight:   92.3 kg   Height:        Intake/Output Summary (Last 24 hours) at 09/02/2021 1059 Last data filed at 09/01/2021 2200 Gross per 24 hour  Intake 240 ml  Output --  Net 240 ml   Last 3 Weights 09/02/2021 08/31/2021 08/30/2021  Weight (lbs) 203 lb 7.8 oz 203 lb 7.8 oz 205 lb 4 oz  Weight (kg) 92.3 kg 92.3 kg 93.1 kg      Telemetry    None  performed- Personally Reviewed  ECG    Sinus r atrial fibrillation with controlled ventricular spots hythm with LVH and repolarization changes- Personally Reviewed  Physical Exam   GEN: No acute distress.   Neck: No JVD Cardiac: RRR, no murmurs, rubs, or gallops.  Respiratory: Clear to auscultation bilaterally. GI: Soft, nontender, non-distended  MS: No edema; No deformity. Neuro:  Nonfocal  Psych: Normal affect   Labs    High Sensitivity Troponin:   Recent Labs  Lab 08/14/21 1845 08/19/21 1003 08/19/21 1135 09/01/21 1826 09/02/21 0447  TROPONINIHS 351* 93* 76* 52* 66*     Chemistry Recent Labs  Lab 09/01/21 0345  NA 135  K 4.0  CL 101  CO2 28  GLUCOSE 284*  BUN 23  CREATININE 0.75  CALCIUM 8.8*  GFRNONAA >60  ANIONGAP 6    Lipids No results for input(s): CHOL, TRIG, HDL, LABVLDL, LDLCALC, CHOLHDL in the last 168 hours.  Hematology Recent Labs  Lab 09/01/21 0345 09/02/21 0447  WBC 5.4  7.0  RBC 2.71* 2.86*  HGB 8.6* 9.2*  HCT 28.4* 29.7*  MCV 104.8* 103.8*  MCH 31.7 32.2  MCHC 30.3 31.0  RDW 16.7* 16.5*  PLT 137* 173   Thyroid No results for input(s): TSH, FREET4 in the last 168 hours.  BNPNo results for input(s): BNP, PROBNP in the last 168 hours.  DDimer No results for input(s): DDIMER in the last 168 hours.   Radiology    DG Knee 1-2 Views Right  Result Date: 09/01/2021 CLINICAL DATA:  Katherine [name] right knee pain, no injury. EXAM: RIGHT KNEE - 1-2 VIEW COMPARISON:  None. FINDINGS: Lateral view is slightly rotated. No definite joint effusion. No fracture. Chondrocalcinosis in the lateral compartment. Scattered surgical clips in the soft tissues. IMPRESSION: 1. No acute findings. 2. Chondrocalcinosis in the lateral compartment of the knee. Electronically Signed   By: Katherine [name] M.D.   On: 09/01/2021 11:41   DG CHEST PORT 1 VIEW  Result Date: 09/02/2021 CLINICAL DATA:  Short of breath EXAM: PORTABLE CHEST 1 VIEW COMPARISON:  None. FINDINGS: Sternotomy wires overlie normal cardiac silhouette. Patient rotated leftward. Diffuse patchy airspace opacities are similar comparison exam. No pleural fluid. No pneumothorax. IMPRESSION: No change in diffuse patchy bilateral airspace disease consistent with multifocal pneumonia versus pulmonary edema. Electronically Signed   By: Katherine [name] M.D.   On: 09/02/2021 09:25    Cardiac Studies   None  Patient Profile     66 y.o. female patient of Dr. Allison [name] admitted with Katherine [name] on 08/04/2021.  She has a history of remote CABG in 1995 with a LIMA to LAD, vein to diagonal branch and to the RCA cath June 2022 by Dr. Haroldine [name] via the right radial approach showed CTO of the native RCA and LAD with 60% native circumflex that was negative by FFR on 02/20/2021.  She had 2 layers of stent in her SVG to D2 and a patent vein graft to the RCA.  She does have a history of renal transplantation with preserved creatinine, AV fistula in left upper  extremity and no access in lower extremities.  She is in 4 W./rehab and has had accelerated angina.  She was also seen by Katherine [name], vascular surgery because of what appears to be occluded iliac arteries although she does not have critical limb ischemia.  Assessment & Plan    1: CAD-history of CABG in 1995 with cath performed via the right radial approach by Dr. Haroldine [name] this past July that showed  RCA and LAD CTO, patent graft to the diagonal and RCA.  Dr. Tamala Julian attempted cath in December but was unsuccessful in accessing either the right or left common femoral arteries.  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  She is had accelerated angina with chest pain last night.  Her enzymes are low and flat.  Her EKG shows no acute changes.  Her only access is right radial.  I have discussed this with Dr. Ellyn Hack who agrees to perform coronary angiography either today or Friday via the right radial approach.  2: Atrial fib/flutter-on Eliquis with controlled ventricular response.  Given plans for coronary angiography will discontinue Eliquis and begin IV heparin.  Plan coronary angiography on Friday.  3: Peripheral arterial disease-patient presumably has occluded iliacs bilaterally.  She does have claudication but does not have critical limb ischemia.  She has been seen by Katherine [name] yesterday.  At this point, I do not believe she is a candidate for aortobifemoral bypass grafting based on her coronary status and other comorbidities.       For questions or updates, please contact [place] Please consult [email or web address] for contact info under        Signed, Quay Burow, MD  09/02/2021, 10:59 AM

## 2021-09-02 NOTE — Progress Notes (Signed)
Occupational Therapy Session Note  Patient Details  Name: Katherine [name] MRN: [id number] Date of Birth: [birth year]  Today's Date: 09/02/2021 OT Individual Time: 0930-1015 OT Individual Time Calculation (min): 45 min    Short Term Goals: Week 1:  OT Short Term Goal 1 (Week 1): STGs = LTGs 2/2 LOS  Skilled Therapeutic Interventions/Progress Updates:    Per MD note: -pt no longer able to participate in therapy due to multiple medical issues. She has fears of dying and told me that she doesn't want to suffer in any way.  -consulted palliative care after discussion with patient -She also wants to be DNR, order was entered -will ask hospitalists to admit her back to acute for further medical management and decision making   Pt now on bedrest.     Spent 45 minutes counseling with pt as she wanted to talk about her desire to have surgery even though she may pass away during surgery. Pt stated, " I don't want to go home and have a heart attack and be alone. I would rather die on the operating table while under anesthesia" Spent quite a bit of time discussing her beliefs. I encouraged her to talk with Palliative care team that will be seeing her later about other options.  Pt stated she really appreciated being able to express herself and found it helpful to talk about her decisions.   Pt resting in bed with all needs met.  Alarm set.      Therapy Documentation Precautions:  Precautions Precautions: Fall Precaution Comments: monitor sats/HR, post covid debility/deconditioned Restrictions Weight Bearing Restrictions: No    Vital Signs: Therapy Vitals Pulse Rate: 90 Resp: 16 Oxygen Therapy SpO2: 92 % O2 Device: Nasal Cannula O2 Flow Rate (L/min): 3 L/min Pain: Pain Assessment Pain Scale: 0-10 Pain Score: 0-No pain Pain Intervention(s): Medication (See eMAR);Repositioned ADL: ADL Eating: Set up Grooming: Setup Upper Body Bathing: Supervision/safety Lower Body Bathing:  Maximal assistance Upper Body Dressing: Minimal assistance Lower Body Dressing: Moderate assistance Toileting: Moderate assistance Where Assessed-Toileting: Bedside Commode Toilet Transfer: Contact guard Toilet Transfer Method: Stand pivot   Therapy/Group: Individual Therapy  Ashla Murph 09/02/2021, 8:26 AM

## 2021-09-02 NOTE — Progress Notes (Signed)
Pt's CBG resulted at 63; 2 Ensures, ProSource, cup of orange juice, & graham crackers were given. CBG rechecked @ 2030 with a result of 68. Pt stated that she has been having a poor PO intake. CBG rechecked once again @ 2056 with an increased result of 84. Will continue to monitor.  Elaina Hoops, RN

## 2021-09-02 NOTE — Discharge Summary (Signed)
Physician Discharge Summary  Patient ID: Katherine [name] MRN: [id number] DOB/AGE: 65-Jun-1957 65 y.o.  Admit date: 08/25/2021 Discharge date: 09/02/2021  Discharge Diagnoses:  Principal Problem:   Debility Active Problems:   Peripheral arterial disease ([place])   Type 2 diabetes mellitus with peripheral neuropathy (HCC)   Renal transplant, status post   Current chronic use of systemic steroids   Coronary artery disease involving native coronary artery of native heart with angina pectoris (HCC)   Unstable angina (HCC)   COPD (chronic obstructive pulmonary disease) ([place])   Post covid-19 condition, unspecified   Longstanding persistent atrial fibrillation (HCC)   Rest pain of lower extremity due to atherosclerosis [facility])   Discharged Condition: fair  Significant Diagnostic Studies: DG Chest 2 View  Result Date: 08/29/2021 CLINICAL DATA:  Shortness of breath EXAM: CHEST - 2 VIEW COMPARISON:  08/14/2021 FINDINGS: Cardiac shadow remains enlarged. Postsurgical changes are noted. Increased vascular congestion and mild pulmonary edema right greater than left is noted. Left-sided PICC line is again identified likely in the left SVC. No pneumothorax or sizable effusion is seen. No bony abnormality is noted. IMPRESSION: CHF with parenchymal edema worse on the right than the left. These results will be called to the ordering clinician or representative by the Radiologist Assistant, and communication documented in the PACS or [employer]. Electronically Signed   By: Inez Catalina M.D.   On: 08/29/2021 22:11   DG Knee 1-2 Views Right  Result Date: 09/01/2021 CLINICAL DATA:  Hervey Ard right knee pain, no injury. EXAM: RIGHT KNEE - 1-2 VIEW COMPARISON:  None. FINDINGS: Lateral view is slightly rotated. No definite joint effusion. No fracture. Chondrocalcinosis in the lateral compartment. Scattered surgical clips in the soft tissues. IMPRESSION: 1. No acute findings. 2. Chondrocalcinosis in the lateral  compartment of the knee. Electronically Signed   By: Lorin Picket M.D.   On: 09/01/2021 11:41   MR LUMBAR SPINE WO CONTRAST  Result Date: 08/29/2021 CLINICAL DATA:  Initial evaluation for low back pain with right lower extremity pain. Immunocompromised. EXAM: MRI LUMBAR SPINE WITHOUT CONTRAST TECHNIQUE: Multiplanar, multisequence MR imaging of the lumbar spine was performed. No intravenous contrast was administered. COMPARISON:  Prior MRI from 08/25/2017. FINDINGS: Segmentation: Standard. Lowest well-formed disc space labeled the L5-S1 level. Alignment: 4 mm anterolisthesis of L4 on L5, chronic and facet mediated, and mildly progressed from prior. Trace retrolisthesis of T11 on T12, stable. Alignment otherwise normal with preservation of the normal lumbar lordosis. Vertebrae: Vertebral body height maintained without acute or chronic fracture. Bone marrow signal intensity within normal limits. No discrete or worrisome osseous lesions or abnormal marrow edema. Conus medullaris and cauda equina: Conus extends to the L1-2 level. Conus and cauda equina appear normal. Paraspinal and other soft tissues: Paraspinous soft tissues demonstrate no acute finding. The visualized native kidneys are markedly atrophic. Multifocal atheromatous irregularity seen throughout the intra-abdominal aorta without visible aneurysm. Disc levels: T10-11: Seen only on sagittal projection. Intervertebral disc space narrowing with disc desiccation and mild disc bulge. Bilateral facet hypertrophy. No significant spinal stenosis. Foramina appear grossly patent. T11-12: Intervertebral disc space narrowing with mild disc bulge and disc desiccation. No significant stenosis. T12-L1: Unremarkable. L1-2: Disc desiccation without significant disc bulge. No canal or foraminal stenosis. L2-3: Disc desiccation without significant disc bulge. No canal or foraminal stenosis. L3-4: Mild intervertebral disc space narrowing with diffuse disc bulge.  Superimposed broad-based left foraminal to extraforaminal disc protrusion contacts the exiting left L3 nerve root (series 6, image 24). Associated annular  fissure. Mild bilateral facet hypertrophy. No significant spinal stenosis. Mild left L3 foraminal narrowing. Right neural foramina remains patent. L4-5: 4 mm anterolisthesis. Mild disc bulge with disc desiccation. Superimposed left foraminal to extraforaminal disc protrusion contacts the exiting left L4 nerve root (series 3, image 12). Advanced bilateral facet arthrosis, progressed from previous. No significant canal or lateral recess stenosis. Mild left greater than right L4 foraminal narrowing. L5-S1: Broad-based left eccentric disc bulge closely approximates and/or contacts the exiting left L5 nerve root (series 6, image 35). Moderate bilateral facet arthrosis. No significant spinal stenosis. Mild left L5 foraminal narrowing without impingement. Right foramen remains patent. IMPRESSION: 1. Broad-based left foraminal to extraforaminal disc protrusions at L3-4 and L4-5, contacting and potentially irritating the exiting left L3 and L4 nerve roots respectively. 2. Broad-based left eccentric disc bulge at L5-S1, closely approximating and/or potentially irritating the exiting left L5 nerve root. 3. Advanced bilateral facet arthrosis at L4-5 with associated 4 mm anterolisthesis, mildly progressed as compared to 2019. 4. No other acute abnormality within the lumbar spine. No findings to explain patient's right lower extremity symptoms identified. Aortic Atherosclerosis (ICD10-I70.0). Electronically Signed   By: Jeannine Boga M.D.   On: 08/29/2021 03:39   DG CHEST PORT 1 VIEW  Result Date: 09/02/2021 CLINICAL DATA:  Short of breath EXAM: PORTABLE CHEST 1 VIEW COMPARISON:  None. FINDINGS: Sternotomy wires overlie normal cardiac silhouette. Patient rotated leftward. Diffuse patchy airspace opacities are similar comparison exam. No pleural fluid. No  pneumothorax. IMPRESSION: No change in diffuse patchy bilateral airspace disease consistent with multifocal pneumonia versus pulmonary edema. Electronically Signed   By: Suzy Bouchard M.D.   On: 09/02/2021 09:25   DG CHEST PORT 1 VIEW  Result Date: 08/14/2021 CLINICAL DATA:  COVID positive. EXAM: PORTABLE CHEST 1 VIEW COMPARISON:  08/12/2021 FINDINGS: Postoperative changes in the mediastinum. Mild cardiac enlargement. Patchy perihilar infiltrates in both lungs may represent edema or multifocal pneumonia. No pleural effusions. No pneumothorax. Mediastinal contours appear intact. IMPRESSION: Cardiac enlargement with patchy perihilar infiltrates in both lungs, possibly edema or multifocal pneumonia. Electronically Signed   By: Lucienne Capers M.D.   On: 08/14/2021 19:02    VAS [place] ABI WITH/WO TBI  Result Date: 08/30/2021  LOWER EXTREMITY DOPPLER STUDY Patient Name:  Ninamarie Keel  Date of Exam:   08/30/2021 Medical Rec #: [id number]           Accession #:    [id number] Date of Birth: [birth year]           Patient Gender: F Patient Age:   37 years Exam Location:  [facility] Procedure:      VAS [place] ABI WITH/WO TBI Referring Phys: Alysia Penna --------------------------------------------------------------------------------  Indications: Rest pain, specifically around the shin area. Pperipheral artery              disease, (cath attempted 08/18/2021- Bilateral iliofemoral              obstruction) preventing catheterization. 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 High Risk         Hypertension, hyperlipidemia, Diabetes, coronary artery Factors:          disease. Other Factors: Recent Covid pneumonia, NSTEMI, history of renal transplant. CABG  1994.  Vascular Interventions: No prior. Comparison Study: Prior LEA done 08/28/13 indicating possible inflow disease on                    the right suggested by common femoral waveforms and severe                   bilateral common femoral artery stenosis. Performing Technologist: Sharion Dove RVS  Examination Guidelines: A complete evaluation includes at minimum, Doppler waveform signals and systolic blood pressure reading at the level of bilateral brachial, anterior tibial, and posterior tibial arteries, when vessel segments are accessible. Bilateral testing is considered an integral part of a complete examination. Photoelectric Plethysmograph (PPG) waveforms and toe systolic pressure readings are included as required and additional duplex testing as needed. Limited examinations for reoccurring indications may be performed as noted.  ABI Findings: +---------+------------------+-----+-------------------+--------+  Right     Rt Pressure (mmHg) Index Waveform            Comment   +---------+------------------+-----+-------------------+--------+  Brachial  115                      multiphasic                   +---------+------------------+-----+-------------------+--------+  PTA       87                 0.76  dampened monophasic           +---------+------------------+-----+-------------------+--------+  DP        22                 0.19  dampened monophasic           +---------+------------------+-----+-------------------+--------+  Great Toe 31                 0.27                                +---------+------------------+-----+-------------------+--------+ +---------+------------------+-----+----------+--------------------+  Left      Lt Pressure (mmHg) Index Waveform   Comment               +---------+------------------+-----+----------+--------------------+  Brachial                                      restricted (fistula)  +---------+------------------+-----+----------+--------------------+  PTA       254                2.21  monophasic                       +---------+------------------+-----+----------+--------------------+  DP         254                2.21  monophasic                       +---------+------------------+-----+----------+--------------------+  Great Toe 73                 0.63                                   +---------+------------------+-----+----------+--------------------+ +-------+----------------+-----------+------------+------------+  ABI/TBI Today's ABI      Today's TBI  Previous ABI Previous TBI  +-------+----------------+-----------+------------+------------+  Right   0.76             0.27        0.96         0.70          +-------+----------------+-----------+------------+------------+  Left    non compressible 0.63        1.04         0.71          +-------+----------------+-----------+------------+------------+ Right ABIs appear decreased compared to prior study on 08/28/13. Bilateral TBIs appear decreased compared to prior study on 08/28/13. Left ABI is now non compressible since study done 08/28/13  Summary: Right: Resting right ankle-brachial index indicates moderate right lower extremity arterial disease. The right toe-brachial index is abnormal. ABI indicates a moderate decrease in flow by velocity, however, significantly dampened waveforms indicates a more severe level of disease. Left: Resting left ankle-brachial index indicates noncompressible left lower extremity arteries. The left toe-brachial index is abnormal.  *See table(s) above for measurements and observations.  Electronically signed by Jamelle Haring on 08/30/2021 at 3:15:06 PM.    Final     VAS US CAROTID  Result Date: 09/02/2021 Carotid Arterial Duplex Study Patient Name:  KAILIA STARRY  Date of Exam:   09/02/2021 Medical Rec #: [id number]           Accession #:    [id number] Date of Birth: February 14, 1956           Patient Gender: F Patient Age:   64 years Exam Location:  [facility] Procedure:      VAS US CAROTID Referring Phys: Jenkins Rouge --------------------------------------------------------------------------------  Risk Factors:       Hypertension, hyperlipidemia, Diabetes, current smoker,                    coronary artery disease. Limitations        Today's exam was limited due to the body habitus of the                    patient and patient position. Comparison Study:  No prior study Performing Technologist: Maudry Mayhew MHA, RDMS, RVT, RDCS  Examination Guidelines: A complete evaluation includes B-mode imaging, spectral Doppler, color Doppler, and power Doppler as needed of all accessible portions of each vessel. Bilateral testing is considered an integral part of a complete examination. Limited examinations for reoccurring indications may be performed as noted.  Right Carotid Findings: +----------+--------+--------+--------+-------------------------+---------+             PSV cm/s EDV cm/s Stenosis Plaque Description        Comments   +----------+--------+--------+--------+-------------------------+---------+  CCA Prox   52       20                                                     +----------+--------+--------+--------+-------------------------+---------+  CCA Distal 55       12                calcific                  Shadowing  +----------+--------+--------+--------+-------------------------+---------+  ICA Prox   193      57       40-59%   heterogenous and calcific Shadowing  +----------+--------+--------+--------+-------------------------+---------+  ICA Distal 75       17                                                     +----------+--------+--------+--------+-------------------------+---------+  ECA        93       5                                                      +----------+--------+--------+--------+-------------------------+---------+ +----------+--------+-------+--------------+-------------------+             PSV cm/s EDV cms Describe       Arm Pressure (mmHG)  +----------+--------+-------+--------------+-------------------+  Subclavian                  Not identified                       +----------+--------+-------+--------------+-------------------+ +---------+--------+--------+--------------+  Vertebral PSV cm/s EDV cm/s Not identified  +---------+--------+--------+--------------+  Left Carotid Findings: +----------+--------+--------+--------+------------------------------+---------+             PSV cm/s EDV cm/s Stenosis Plaque Description             Comments   +----------+--------+--------+--------+------------------------------+---------+  CCA Prox   95       19                                                          +----------+--------+--------+--------+------------------------------+---------+  CCA Distal 77       24                                                          +----------+--------+--------+--------+------------------------------+---------+  ICA Prox   151      41       40-59%   heterogenous, irregular and    Shadowing                                         calcific                                  +----------+--------+--------+--------+------------------------------+---------+  ICA Distal 137      38                                                          +----------+--------+--------+--------+------------------------------+---------+  ECA        140      34  heterogenous and calcific      shadowing  +----------+--------+--------+--------+------------------------------+---------+ +----------+--------+--------+--------------+-------------------+             PSV cm/s EDV cm/s Describe       Arm Pressure (mmHG)  +----------+--------+--------+--------------+-------------------+  Subclavian                   Not identified                      +----------+--------+--------+--------------+-------------------+ +---------+--------+--+--------+--+---------+  Vertebral PSV cm/s 56 EDV cm/s 20 Antegrade  +---------+--------+--+--------+--+---------+   Summary: Right Carotid: Velocities in the right ICA are consistent with a 40-59%                stenosis. Left Carotid:  Velocities in the left ICA are consistent with a 40-59% stenosis. Vertebrals:  Left vertebral artery demonstrates antegrade flow. Right vertebral              artery was not visualized. 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 *See table(s) above for measurements and observations.  Electronically signed by Deitra Mayo MD on 09/02/2021 at 3:24:09 PM.    Final    VAS [place] LOWER EXTREMITY ARTERIAL DUPLEX  Result Date: 08/30/2021 LOWER EXTREMITY ARTERIAL DUPLEX STUDY Patient Name:  ALICIA SEIB  Date of Exam:   08/30/2021 Medical Rec #: [id number]           Accession #:    [id number] Date of Birth: 05-21-1956           Patient Gender: F Patient Age:   74 years Exam Location:  [facility] Procedure:      VAS [place] LOWER EXTREMITY ARTERIAL DUPLEX Referring Phys: Alysia Penna --------------------------------------------------------------------------------  Indications: Rest pain,specifically at shin area. 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 High Risk Factors: Hypertension, hyperlipidemia, Diabetes, prior MI, coronary                    artery disease. Other Factors: Kidney transplant, CABG, recent Covid pneumonia.  Vascular Interventions: No prior intervention. Current ABI:            R:0.76 L:non compressible Comparison Study: Prior LEA done 08/28/13 indicating possible inflow disease on                   the right suggested by right CFA waveforms. Severe bilateral                   CFA stenosis. Performing Technologist: Sharion Dove RVS  Examination Guidelines: A complete evaluation includes B-mode imaging, spectral Doppler, color Doppler, and power Doppler as needed of all accessible portions of each vessel. Bilateral testing is considered an integral part of a complete examination.  Limited examinations for reoccurring indications may be performed as noted.  +-----------+--------+-----+--------+----------+---------------------+  RIGHT       PSV cm/s Ratio Stenosis Waveform   Comments               +-----------+--------+-----+--------+----------+---------------------+  CFA Prox    52                      monophasic                        +-----------+--------+-----+--------+----------+---------------------+  DFA         22                      monophasic                        +-----------+--------+-----+--------+----------+---------------------+  SFA Prox    43                      monophasic collateral flow noted  +-----------+--------+-----+--------+----------+---------------------+  SFA Mid     35                      monophasic                        +-----------+--------+-----+--------+----------+---------------------+  SFA Distal  32                      monophasic                        +-----------+--------+-----+--------+----------+---------------------+  POP Prox    22                      monophasic                        +-----------+--------+-----+--------+----------+---------------------+  POP Distal  32                      monophasic                        +-----------+--------+-----+--------+----------+---------------------+  ATA Prox    21                      monophasic                        +-----------+--------+-----+--------+----------+---------------------+  ATA Mid     25                      monophasic                        +-----------+--------+-----+--------+----------+---------------------+  ATA Distal  30                      monophasic                        +-----------+--------+-----+--------+----------+---------------------+  PTA Prox    39                      monophasic                        +-----------+--------+-----+--------+----------+---------------------+  PTA Mid     39                      monophasic                         +-----------+--------+-----+--------+----------+---------------------+  PTA Distal  53                      monophasic                        +-----------+--------+-----+--------+----------+---------------------+  PERO Prox                           absent                            +-----------+--------+-----+--------+----------+---------------------+  PERO Mid                            absent                            +-----------+--------+-----+--------+----------+---------------------+  PERO Distal                         absent                            +-----------+--------+-----+--------+----------+---------------------+   +----------+--------+-----+--------+----------+--------+  LEFT       PSV cm/s Ratio Stenosis Waveform   Comments  +----------+--------+-----+--------+----------+--------+  CFA Prox   52                      monophasic           +----------+--------+-----+--------+----------+--------+  DFA        45                      monophasic           +----------+--------+-----+--------+----------+--------+  SFA Prox   57                      monophasic           +----------+--------+-----+--------+----------+--------+  SFA Mid    46                      monophasic           +----------+--------+-----+--------+----------+--------+  SFA Distal 32                      monophasic           +----------+--------+-----+--------+----------+--------+  POP Prox   31                      monophasic           +----------+--------+-----+--------+----------+--------+  POP Distal 24                      monophasic           +----------+--------+-----+--------+----------+--------+  ATA Prox   21                      monophasic           +----------+--------+-----+--------+----------+--------+  ATA Mid    25                      monophasic           +----------+--------+-----+--------+----------+--------+  ATA Distal 14                      monophasic            +----------+--------+-----+--------+----------+--------+  PTA Prox   30  monophasic           +----------+--------+-----+--------+----------+--------+  PTA Mid    28                      monophasic           +----------+--------+-----+--------+----------+--------+  PTA Distal 25                      monophasic           +----------+--------+-----+--------+----------+--------+  Summary: Right: Atherosclerotic plaque noted throughout. Monophasic waveforms suggestive of iliofemoral disease. Some collateral flow noted at the mid SFA. Left: Atherosclerotic plaque noted throughout. Monophasic waveforms suggestive of iliofemoral disease.  See table(s) above for measurements and observations. Electronically signed by Jamelle Haring on 08/30/2021 at 3:16:10 PM.    Final    VAS [place] LOWER EXTREMITY VENOUS (DVT)  Result Date: 08/28/2021  Lower Venous DVT Study Patient Name:  KEMI GELL  Date of Exam:   08/28/2021 Medical Rec #: [id number]           Accession #:    [id number] Date of Birth: 1956-03-30           Patient Gender: F Patient Age:   17 years Exam Location:  [facility] Procedure:      VAS [place] LOWER EXTREMITY VENOUS (DVT) Referring Phys: Alysia Penna --------------------------------------------------------------------------------  Indications: Pain.  Limitations: Body habitus and poor ultrasound/tissue interface. Comparison Study: 08/09/2021- negative bilateral lower extremity venous duplex Performing Technologist: Maudry Mayhew MHA, RDMS, RVT, RDCS  Examination Guidelines: A complete evaluation includes B-mode imaging, spectral Doppler, color Doppler, and power Doppler as needed of all accessible portions of each vessel. Bilateral testing is considered an integral part of a complete examination. Limited examinations for reoccurring indications may be performed as noted. The reflux portion of the exam is performed with the patient in reverse Trendelenburg.   +---------+---------------+---------+-----------+----------+--------------+  RIGHT     Compressibility Phasicity Spontaneity Properties Thrombus Aging  +---------+---------------+---------+-----------+----------+--------------+  CFV       Full            Yes       Yes                                    +---------+---------------+---------+-----------+----------+--------------+  SFJ       Full                                                             +---------+---------------+---------+-----------+----------+--------------+  FV Prox   Full                                                             +---------+---------------+---------+-----------+----------+--------------+  FV Mid    Full                                                             +---------+---------------+---------+-----------+----------+--------------+  FV Distal Full                                                             +---------+---------------+---------+-----------+----------+--------------+  PFV       Full                                                             +---------+---------------+---------+-----------+----------+--------------+  POP       Full            Yes       Yes                                    +---------+---------------+---------+-----------+----------+--------------+  PTV       Full                                                             +---------+---------------+---------+-----------+----------+--------------+  PERO      Full                                                             +---------+---------------+---------+-----------+----------+--------------+   Right Technical Findings: Not visualized segments include limited visualization PTV and peroneal veins.  +---------+---------------+---------+-----------+----------+--------------+  LEFT      Compressibility Phasicity Spontaneity Properties Thrombus Aging  +---------+---------------+---------+-----------+----------+--------------+  CFV       Full             Yes       Yes                                    +---------+---------------+---------+-----------+----------+--------------+  SFJ       Full                                                             +---------+---------------+---------+-----------+----------+--------------+  FV Prox   Full                                                             +---------+---------------+---------+-----------+----------+--------------+  FV Mid    Full                                                             +---------+---------------+---------+-----------+----------+--------------+  FV Distal Full                                                             +---------+---------------+---------+-----------+----------+--------------+  PFV       Full                                                             +---------+---------------+---------+-----------+----------+--------------+  POP       Full            Yes       Yes                                    +---------+---------------+---------+-----------+----------+--------------+  PTV       Full                                                             +---------+---------------+---------+-----------+----------+--------------+  PERO      Full                                                             +---------+---------------+---------+-----------+----------+--------------+   Left Technical Findings: Not visualized segments include limited visualization PTV and peroneal veins.   Summary: RIGHT: - There is no evidence of deep vein thrombosis in the lower extremity. However, portions of this examination were limited- see technologist comments above.  - No cystic structure found in the popliteal fossa.  LEFT: - There is no evidence of deep vein thrombosis in the lower extremity. However, portions of this examination were limited- see technologist comments above.  - No cystic structure found in the popliteal fossa.  *See table(s) above for measurements and  observations. Electronically signed by Orlie Pollen on 08/28/2021 at 3:58:46 PM.    Final     Labs:  Basic Metabolic Panel: BMP Latest Ref Rng & Units 09/01/2021 08/26/2021 08/25/2021  Glucose 70 - 99 mg/dL 284(H) 174(H) 413(H)  BUN 8 - 23 mg/dL 23 24(H) -  Creatinine 0.44 - 1.00 mg/dL 0.75 0.81 -  Sodium 135 - 145 mmol/L 135 135 -  Potassium 3.5 - 5.1 mmol/L 4.0 3.8 -  Chloride 98 - 111 mmol/L 101 97(L) -  CO2 22 - 32 mmol/L 28 31 -  Calcium 8.9 - 10.3 mg/dL 8.8(L) 8.8(L) -     CBC: CBC Latest Ref Rng & Units 09/02/2021 09/01/2021 08/26/2021  WBC 4.0 - 10.5 K/uL 7.0 5.4 7.1  Hemoglobin 12.0 - 15.0 g/dL 9.2(L) 8.6(L) 9.5(L)  Hematocrit 36.0 - 46.0 % 29.7(L) 28.4(L) 29.9(L)  Platelets 150 - 400 K/uL 173 137(L) 97(L)     CBG: Recent Labs  Lab 09/01/21 1627  09/01/21 2156 09/02/21 0616 09/02/21 1153 09/02/21 1631  GLUCAP 211* 230* 243* 255* 227*    Brief HPI:   Katherine [name] is a 66 y.o. female with history of CAD s/p CABG/PCI-07/20, PVD, PAF/torsade, chronic diastolic CHF, OSA, T9QZ with neuropathy, renal transplant, RA/FM with chronic pain, COPD 3 L oxygen dependent was originally admitted to [facility] long hospital on 07/25/2021 with acute on chronic respiratory failure from COVID-pneumonia.  She was treated with steroids, remdesivir and ceftriaxone with improvement in symptoms.  Hospital course was significant for development of chest pain 12/24 with acute on chronic CHF as well as elevated troponins due to NSTEMI.    She was transitioned to IV heparin, treated with diuresis but continued to have rising troponin with increasing oxygen needs.  She was taken to Cath Lab on 01/05 but found to have stenosis obstructive procedure therefore procedure aborted.  Medical treatment was recommended by Dr. Tamala Julian.  She has also had issues with high levels of anxiety and was started on Ativan twice daily as well as titration of Zoloft to help manage symptoms.  Therapy was initiated and aspirin  ongoing.  Patient was noted to have decline in mobility with hypoxia as well as anxiety affecting ability to carry out ADLs as well as mobility.  CIR was recommended due to functional decline.   Hospital Course: Desirai Traxler was admitted to rehab 08/25/2021 for inpatient therapies to consist of PT, ST and OT at least three hours five days a week. Past admission physiatrist, therapy team and rehab RN have worked together to provide customized collaborative inpatient rehab.  Blood pressures were monitored on TID basis and have been controlled. Her po intake has improved and she is continent of B/B. Her diabetes has been monitored with ac/hs CBG checks and SSI was use prn for tighter BS control. Prednisone was tapered to 10 mg daily with improvement in BS. Insulin glargine was titrated to 20 units and metformin was adjusted to 1000 mg daily in am. Weight have been monitored daily and no signs of overload noted with weight trending down to 92.3 kg. Respiratory status has been stable on home dose oxygen.   Follow up CBC showed H/H to be slowly improving. Follow up BMET showed that pre-renal azotemia has resolved and lytes were WNL. She did report RLE pain with tingling and MRI lumbar spine was negative for compression. BLE dopplers were negative for DVT. She continued to have intermittent issues with chest pain as well as rest pain RLE.  ABIs done showed evidence of bilateral diffuse atherosclerosis with diminished flow throughout disease and Dr. Donzetta Matters was consulted for input.  He recommended revascularization which would include bilateral common femoral endarterectomies with possible retrograde common iliac artery stenting but given high risk nature of surgery as well as her cardiac issues, he wanted to talk with cardiology for clearance prior to third procedure.  On evening of 01/16, she developed angina with minimal activity requiring nitroglycerin x2.  She was also noted to have A. fib with rapid rate up  to 120s and cardiology was consulted for input. Her  metoprolol was changed to Lopressor 25 mg TID daily and Isordil 10 mg BID was added to help manage cardiac symptoms.  She was not deemed to be a candidate for open vascular procedure however, she requested a chance vascular surgery due to issues with leg pain and for QOL.   She did refuse therapy on 01/17 a.m. due to concerns of recurrent chest pain or  death due to concerns of stress from activity. Palliative care was consulted to discuss goals of care.  Dr. Gwenlyn Found followed up to evaluate patient the next day and recommended coronary angio for work up as due to acceleration of symptoms. Dr. Fuller Plan was contacted for assistance and graciously agreed to transfer patient to acute floor for telemetry and closer monitoring.  Patient was discharged to acute services on 01/17.    Rehab course:  During patient's stay in rehab team conferences were held to monitor patient's progress, set goals and discuss barriers to discharge. At admission, patient required min to mod assist with ADL tasks and supervision to [facility] for mobility. Her progress was limited by debility as well as LE pain. She was able to transfers with supervision and able to ambulate 8' X 3 with RW. Her progress has been limited and therapy was discontinued due to medical issues requiring tranfer to acute for management.     Disposition: To acute floor Eliquis 5 mg po bid Brovana 15 mcg nebulized BID Imuran 125 mg po daily Vitamin C 500 mg po daily Pulmicort 0.5 mg nebulized BID. Flexeril 10 mg po TID prn.  Plavix 75 mg po daily Rocaltrol 0.49mcg po every 3 days.  Flonase 50 mcg 2 squirts each nares daily Voltaren gel 2 grams TID to right knee Robitussin DM 10 ml po qid. Anusol HC 25 mg one PR BID. Novolog 8 units TID ac. Insulin glargine 20 units daily.  Isordil 10 mg po bid Lamictal 25 mg po HS daily Linzess 145 mcg po daily Claritin 10 mg po daily  Tradjenta 5 mg po  daily Ativan 0.5 mg po bid prn.  MMW 5 cc po qid swish and swallow.  22. Metformin 1000 mg po bid. 23. Lopressor 25 mg po TID 24. Singulair 10 mg po daily 25. MVI one po daily 26. NTG 0.4 mg one tab SL every 5 minutes X 3 prn CP 27. Protonix 40 mg po daily.  28. xycodone 10 mg po qid prn.  27. Senna S 2 tabs po daily 28. Zoloft 100 mg po daily 29. Crestor 20 mg po daily. 30. Ensure max po TID between meals.  31. Lamisil 1% to bilateral feet and areas between toes BID.  32. Zinc sulfate 220 mg po daily.  33. Compazine 5 mg- 10 mg po ever 4-6 hours prn nausea.  34. Zoloft 100 mg po daily.    Diet: Heart healthy/Carb modified.    Special Instructions: Monitor blood sugar AC/HS. Use SSI per protocol.   Disposition:  To acute hospital    Follow-up Information     Meredith Staggers, MD Follow up.   Specialty: Physical Medicine and Rehabilitation Why: No formal follow-up needed Contact information: [address] [place] [address] [phone]         [name], MD Follow up.   Specialty: Cardiology Why: Call for appointment Contact information: [address] [place] [address] [phone]         [facility] [place] Follow up.   Specialty: Vascular Surgery Contact information: [place] [phone]                Signed: Bary Leriche 09/02/2021, 7:25 PM

## 2021-09-02 NOTE — Progress Notes (Signed)
Inpatient Rehabilitation Care Coordinator Discharge Note   Patient Details  Name: Katherine [name] MRN: [id number] Date of Birth: 1956-03-11   Discharge location: TRANSFERRING BACK TO ACUTE FOR MEDICAL ISSUES  Length of Stay: 8 DAYS  Discharge activity level: [place]  Home/community participation:    Patient response SL:HTDSKA Literacy - How often do you need to have someone help you when you read instructions, pamphlets, or other written material from your doctor or pharmacy?: Sometimes  Patient response JG:OTLXBW Isolation - How often do you feel lonely or isolated from those around you?: Never  Services provided included: MD, PT, OT, RD, RN, CM, Pharmacy, SW  Financial Services:  Financial Services Utilized: Medicare    Choices offered to/list presented to: PT  Follow-up services arranged:  Home Health, Patient/Family request agency HH/DME, DME Home Health Agency: [place], OT, RN    DME : [facility] HH/DME Requested Agency: [facility]  Patient response to transportation need: Is the patient able to respond to transportation needs?: Yes In the past 12 months, has lack of transportation kept you from medical appointments or from getting medications?: Yes In the past 12 months, has lack of transportation kept you from meetings, work, or from getting things needed for daily living?: Yes    Comments (or additional information): PT NOT MEDICALLY STABLE AND CONCERNED SHE IS DYING. PALLIATIVE CONSULT PLACED THIS AM AND WILL BE SEEN ON ACUTE FOR GOALS OF CARE. PT NEEDS MUCH SUPPORT AND ENCOURAGEMENT  Patient/Family verbalized understanding of follow-up arrangements:  Yes  Individual responsible for coordination of the follow-up plan: SELF AND Katherine [name]-NIECE [phone]  Confirmed correct DME delivered: Katherine [name] 09/02/2021    [name], [place]

## 2021-09-02 NOTE — Progress Notes (Signed)
Occupational Therapy Note  Patient Details  Name: Katherine [name] MRN: [id number] Date of Birth: Jan 08, 1956  Occupational Therapy Discharge Note  This patient was unable to complete the inpatient rehab program due to complex medical problems and declining health; therefore did not meet their long term goals.   Pt left the program at a total assist level for their  functional ADLs as she was on hospital bed rest.   This patient is being discharged from OT services at this time.  BIMS at time of d/c  Pt unable to complete due to medical status  See CareTool for functional status details.  If the patient is able to return to inpatient rehabilitation within 3 midnights, this may be considered an interrupted stay and therapy services will resume as ordered. Modification and reinstatement of their goals will be made upon completion of therapy service reevaluations.       [place] 09/02/2021, 12:25 PM

## 2021-09-02 NOTE — Progress Notes (Signed)
Physical Therapy Note  Patient Details  Name: Katherine [name] MRN: [id number] Date of Birth: 1955/09/22 Today's Date: 09/02/2021    Physical Therapy Discharge Note  This patient was unable to complete the inpatient rehab program due to complex medical problems and declining health; therefore did not meet their long term goals. Pt left the program at a supervision level at best for their functional mobility/ transfers. This patient is being discharged from PT services at this time.  Pt's perception of pain in the last five days was unable to answer at this time.    See CareTool for functional status details  If the patient is able to return to inpatient rehabilitation within 3 midnights, this may be considered an interrupted stay and therapy services will resume as ordered. Modification and reinstatement of their goals will be made upon completion of therapy service reevaluations.     Mickel Fuchs 09/02/2021, 12:39 PM

## 2021-09-02 NOTE — Progress Notes (Addendum)
PROGRESS NOTE   Subjective/Complaints: Pt feels sob, no chest pain. Concerned about what's going on with her medical conditions. "Am I gonna die?"  No chest pain this morning but had some last night along with rapid afib. Still with R leg pain. Very anxious  ROS: Patient denies fever, rash, sore throat, blurred vision, nausea, vomiting, diarrhea, cough,  headache .  Objective:   DG Knee 1-2 Views Right  Result Date: 09/01/2021 CLINICAL DATA:  Sharp right knee pain, no injury. EXAM: RIGHT KNEE - 1-2 VIEW COMPARISON:  None. FINDINGS: Lateral view is slightly rotated. No definite joint effusion. No fracture. Chondrocalcinosis in the lateral compartment. Scattered surgical clips in the soft tissues. IMPRESSION: 1. No acute findings. 2. Chondrocalcinosis in the lateral compartment of the knee. Electronically Signed   By: Lorin Picket M.D.   On: 09/01/2021 11:41   DG CHEST PORT 1 VIEW  Result Date: 09/02/2021 CLINICAL DATA:  Short of breath EXAM: PORTABLE CHEST 1 VIEW COMPARISON:  None. FINDINGS: Sternotomy wires overlie normal cardiac silhouette. Patient rotated leftward. Diffuse patchy airspace opacities are similar comparison exam. No pleural fluid. No pneumothorax. IMPRESSION: No change in diffuse patchy bilateral airspace disease consistent with multifocal pneumonia versus pulmonary edema. Electronically Signed   By: Suzy Bouchard M.D.   On: 09/02/2021 09:25   Recent Labs    09/01/21 0345 09/02/21 0447  WBC 5.4 7.0  HGB 8.6* 9.2*  HCT 28.4* 29.7*  PLT 137* 173   Recent Labs    09/01/21 0345  NA 135  K 4.0  CL 101  CO2 28  GLUCOSE 284*  BUN 23  CREATININE 0.75  CALCIUM 8.8*    Intake/Output Summary (Last 24 hours) at 09/02/2021 0947 Last data filed at 09/01/2021 2200 Gross per 24 hour  Intake 240 ml  Output --  Net 240 ml        Physical Exam: Vital Signs Blood pressure (!) 131/52, pulse 90, temperature 98.6  F (37 C), temperature source Oral, resp. rate 16, height 5\' 1"  (1.549 m), weight 92.3 kg, SpO2 92 %.   Constitutional: No distress . Vital signs reviewed. obese HEENT: NCAT, EOMI, oral membranes moist Neck: supple Cardiovascular: RRR without murmur. No JVD    Respiratory/Chest: crackles at bases. On 3L oxygen [place] GI/Abdomen: BS +, non-tender, non-distended Ext: no clubbing, cyanosis, 1+ LE edema Psych: very anxious  Skin: No evidence of breakdown, no evidence of rash., old surgical scar  Neurologic: Cranial nerves II through XII intact, motor strength is 4/5 in bilateral deltoid, bicep, tricep, grip, hip flexor, knee extensors, ankle dorsiflexor and plantar flexor   Musculoskeletal: does have anterior as well as med/lateral joint pain at right knee with palpation and ROM still     Assessment/Plan: 1. Functional deficits which require 3+ hours per day of interdisciplinary therapy in a comprehensive inpatient rehab setting. Physiatrist is providing close team supervision and 24 hour management of active medical problems listed below. Physiatrist and rehab team continue to assess barriers to discharge/monitor patient progress toward functional and medical goals  Care Tool:  Bathing    Body parts bathed by patient: Right arm, Left arm, Chest, Abdomen, Right upper leg, Front  perineal area, Left upper leg, Face   Body parts bathed by helper: Buttocks, Right lower leg, Left lower leg, Right arm, Left arm, Chest, Abdomen, Front perineal area, Right upper leg, Left upper leg, Face     Bathing assist Assist Level: Moderate Assistance - Patient 50 - 74%     Upper Body Dressing/Undressing Upper body dressing   What is the patient wearing?: Hospital gown only    Upper body assist Assist Level: Minimal Assistance - Patient > 75%    Lower Body Dressing/Undressing Lower body dressing      What is the patient wearing?: Incontinence brief     Lower body assist Assist for lower body  dressing: Moderate Assistance - Patient 50 - 74%     Toileting Toileting    Toileting assist Assist for toileting: Supervision/Verbal cueing     Transfers Chair/bed transfer  Transfers assist     Chair/bed transfer assist level: Supervision/Verbal cueing     Locomotion Ambulation   Ambulation assist      Assist level: Contact Guard/Touching assist Assistive device: Walker-rolling Max distance: 50'   Walk 10 feet activity   Assist     Assist level: Contact Guard/Touching assist Assistive device: Walker-rolling   Walk 50 feet activity   Assist    Assist level: Contact Guard/Touching assist Assistive device: Walker-rolling    Walk 150 feet activity   Assist Walk 150 feet activity did not occur: Safety/medical concerns (Fatigue)         Walk 10 feet on uneven surface  activity   Assist Walk 10 feet on uneven surfaces activity did not occur: Safety/medical concerns (Fatigue)         Wheelchair     Assist Is the patient using a wheelchair?: Yes Type of Wheelchair: Manual    Wheelchair assist level: Dependent - Patient 0%      Wheelchair 50 feet with 2 turns activity    Assist        Assist Level: Dependent - Patient 0%   Wheelchair 150 feet activity     Assist      Assist Level: Dependent - Patient 0%   Blood pressure (!) 131/52, pulse 90, temperature 98.6 F (37 C), temperature source Oral, resp. rate 16, height 5\' 1"  (1.549 m), weight 92.3 kg, SpO2 92 %.    Medical Problem List and Plan: 1. Functional deficits secondary to debility from bacterial and viral PNA community acquired             -patient may  shower             -ELOS/Goals: n/a -pt no longer able to participate in therapy due to multiple medical issues. She has fears of dying and told me that she doesn't want to suffer in any way.  -consulted palliative care after discussion with patient -She also wants to be DNR, order was entered -will ask  hospitalists to admit her back to acute for further medical management and decision making  2.  Antithrombotics: -DVT/anticoagulation:  Pharmaceutical: Other (comment) Eliquis for Afib             -antiplatelet therapy: Plavix 3. 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 MRI did not account for right leg pain  -right knee xray unremarkable  -ABI's and arterial dopplers indicate PAD/atherosclerosis. Vascular surgery recommends intervention but she's not a candidate from a cardiac  standpoint   4. Mood: Ativan, 0.5mg  BID, Zoloft 100mg  qd             -antipsychotic agents: NA 5. Neuropsych: This patient is capable of making decisions on her own behalf. 6. Skin/Wound Care: MASD , probable mild candida and shearing , barrier cream, nystatin topical , transfer training  7. Fluids/Electrolytes/Nutrition: Monitor I/Os  8.  COPD prn alb inh, Brovana BID, Pulmicort nebs BID, Montelukast  9.  DM glimipride, metformin, insulin glargine 20U qam, Novalog 8U TID ac   CBG (last 3)  Recent Labs    09/01/21 1627 09/01/21 2156 09/02/21 0616  GLUCAP 211* 230* 243*  Increased glargine to 20U Metformin changed to 1000mg  qam  Prednisone reduce to 10mg  yesterday , tolerating wean , CBGs still elevated,    -cover with SSI    -1/17 reduce prednisone to 5mg  today  10.  Hx ESRD, renal transplant 1991, Imuran, prednisone wean from 15mg  to 5mg  change Q3d  11.  HTN- metoprolol Vitals:   09/01/21 2020 09/02/21 0821  BP: (!) 131/52   Pulse: 93 90  Resp: 15 16  Temp: 98.6 F (37 C)   SpO2: 97% 92%  Controlled 1/17  12.  HLD , PAD crestor, clopidigrel 13.  Constipation due to opioids and immobility- senna-S, Miralax, Linzess  14.  Hx of CAD/CABG, Afib.   -pt had angina yesterday and AF RVR. -appreciate cards consult and recs -metoprolol and isordil  -she's not a candidate for vascular procedure -pt  is sob this morning -Today's cxr with patchy airspace disease, likely pulmonary edema (less likely pneumonia given clinical picture)    -give 20mg  iv lasix now   -check ekg Filed Weights   08/30/21 0520 08/31/21 0500 09/02/21 0500  Weight: 93.1 kg 92.3 kg 92.3 kg      Forty-five minutes of face to face patient care time were spent during this visit. All questions were encouraged and answered.    LOS: 8 days A FACE TO FACE EVALUATION WAS PERFORMED  Meredith Staggers 09/02/2021, 9:47 AM

## 2021-09-02 NOTE — Progress Notes (Signed)
°  Progress Note    09/02/2021 2:14 PM   Subjective: Denies foot pain today  Vitals:   09/02/21 1252  BP: (!) (P) 106/56  Pulse: (P) 69  Resp: (!) 25  SpO2: (P) 94%    Physical Exam: Awake alert oriented Mild shortness of breath on 3 L nasal cannula Bilateral feet are warm no pain to touch  CBC    Component Value Date/Time   WBC 7.0 09/02/2021 0447   RBC 2.86 (L) 09/02/2021 0447   HGB 9.2 (L) 09/02/2021 0447   HCT 29.7 (L) 09/02/2021 0447   PLT 173 09/02/2021 0447   MCV 103.8 (H) 09/02/2021 0447   MCH 32.2 09/02/2021 0447   MCHC 31.0 09/02/2021 0447   RDW 16.5 (H) 09/02/2021 0447   LYMPHSABS 1.2 08/26/2021 0311   MONOABS 0.8 08/26/2021 0311   EOSABS 0.1 08/26/2021 0311   BASOSABS 0.0 08/26/2021 0311    BMET    Component Value Date/Time   NA 135 09/01/2021 0345   NA 134 (A) 01/04/2018 0000   K 4.0 09/01/2021 0345   CL 101 09/01/2021 0345   CO2 28 09/01/2021 0345   GLUCOSE 284 (H) 09/01/2021 0345   BUN 23 09/01/2021 0345   BUN 17 01/04/2018 0000   CREATININE 0.75 09/01/2021 0345   CREATININE 0.90 06/26/2014 1108   CALCIUM 8.8 (L) 09/01/2021 0345   GFRNONAA >60 09/01/2021 0345   GFRAA 58 (L) 04/12/2020 1400    INR    Component Value Date/Time   INR 1.2 01/31/2021 0143    No intake or output data in the 24 hours ending 09/02/21 1414   Assessment/plan:  66 y.o. female is here with right greater than left lower extremity pain with occluded bilateral common iliac arteries and common femoral arteries and left lower quadrant renal transplant anastomosed to the left external iliac artery which is fully functional.  Yesterday discussed her surgical options but given her lack of pain today and her high risk nature we are currently holding on any intervention.  Certainly if she has worsened pain we can consider bilateral common femoral endarterectomies with retrograde stenting of the common iliac arteries versus femorofemoral bypass versus axillary to bifemoral  bypass.  At this time we are not planning for any surgery during this hospitalization.   Reisha Wos C. Donzetta Matters, MD [facility] Office: [phone] Pager: [phone]  09/02/2021 2:14 PM

## 2021-09-02 NOTE — Progress Notes (Signed)
Carotid artery duplex completed. Refer to "CV Proc" under chart review to view preliminary results.  09/02/2021 12:30 PM Kelby Aline., MHA, RVT, RDCS, RDMS

## 2021-09-03 DIAGNOSIS — I70229 Atherosclerosis of native arteries of extremities with rest pain, unspecified extremity: Secondary | ICD-10-CM

## 2021-09-03 DIAGNOSIS — Z515 Encounter for palliative care: Secondary | ICD-10-CM

## 2021-09-03 DIAGNOSIS — I2510 Atherosclerotic heart disease of native coronary artery without angina pectoris: Secondary | ICD-10-CM

## 2021-09-03 DIAGNOSIS — Z9861 Coronary angioplasty status: Secondary | ICD-10-CM

## 2021-09-03 LAB — GLUCOSE, CAPILLARY
Glucose-Capillary: 184 mg/dL — ABNORMAL HIGH (ref 70–99)
Glucose-Capillary: 169 mg/dL — ABNORMAL HIGH (ref 70–99)
Glucose-Capillary: 303 mg/dL — ABNORMAL HIGH (ref 70–99)
Glucose-Capillary: 157 mg/dL — ABNORMAL HIGH (ref 70–99)
Glucose-Capillary: 236 mg/dL — ABNORMAL HIGH (ref 70–99)

## 2021-09-03 LAB — CBC
HCT: 27.3 % — ABNORMAL LOW (ref 36.0–46.0)
Hemoglobin: 8.5 g/dL — ABNORMAL LOW (ref 12.0–15.0)
MCH: 31.6 pg (ref 26.0–34.0)
MCHC: 31.1 g/dL (ref 30.0–36.0)
MCV: 101.5 fL — ABNORMAL HIGH (ref 80.0–100.0)
Platelets: 186 10*3/uL (ref 150–400)
RBC: 2.69 MIL/uL — ABNORMAL LOW (ref 3.87–5.11)
RDW: 17 % — ABNORMAL HIGH (ref 11.5–15.5)
WBC: 5.7 10*3/uL (ref 4.0–10.5)
nRBC: 0.7 % — ABNORMAL HIGH (ref 0.0–0.2)

## 2021-09-03 LAB — BASIC METABOLIC PANEL
Anion gap: 8 (ref 5–15)
BUN: 18 mg/dL (ref 8–23)
CO2: 25 mmol/L (ref 22–32)
Calcium: 8.5 mg/dL — ABNORMAL LOW (ref 8.9–10.3)
Chloride: 99 mmol/L (ref 98–111)
Creatinine, Ser: 0.75 mg/dL (ref 0.44–1.00)
GFR, Estimated: >60 mL/min (ref >60)
Glucose, Bld: 206 mg/dL — ABNORMAL HIGH (ref 70–99)
Potassium: 3.8 mmol/L (ref 3.5–5.1)
Sodium: 132 mmol/L — ABNORMAL LOW (ref 135–145)

## 2021-09-03 LAB — APTT
aPTT: 46 seconds — ABNORMAL HIGH (ref 24–36)
aPTT: >200 seconds (ref 24–36)
aPTT: 38 seconds — ABNORMAL HIGH (ref 24–36)

## 2021-09-03 LAB — HEPARIN LEVEL (UNFRACTIONATED): Heparin Unfractionated: >1.1 IU/mL — ABNORMAL HIGH (ref 0.30–0.70)

## 2021-09-03 MED ORDER — FUROSEMIDE 10 MG/ML IJ SOLN
40.0000 mg | Freq: Once | INTRAMUSCULAR | Status: AC
  Administered 2021-09-03: 40 mg via INTRAVENOUS
  Filled 2021-09-03: qty 4

## 2021-09-03 MED ORDER — HEPARIN (PORCINE) 25000 UT/250ML-% IV SOLN
950.0000 [IU]/h | INTRAVENOUS | Status: DC
  Administered 2021-09-03 – 2021-09-04 (×2): 700 [IU]/h via INTRAVENOUS
  Filled 2021-09-03: qty 250

## 2021-09-03 NOTE — Progress Notes (Signed)
°  Progress Note    09/03/2021 7:44 AM * No surgery found *  Subjective:  No rest pain BLE.  Denies chest pain this morning   Vitals:   09/03/21 0005 09/03/21 0406  BP: 131/61 (!) 93/50  Pulse: 98 70  Resp: 18 20  Temp: 100.1 F (37.8 C) 99 F (37.2 C)  SpO2: 94% 95%   Physical Exam: Lungs: non labored Extremities:  L foot warm to touch, R foot slightly cooler; no wounds Neurologic: A&O  CBC    Component Value Date/Time   WBC 5.7 09/03/2021 0419   RBC 2.69 (L) 09/03/2021 0419   HGB 8.5 (L) 09/03/2021 0419   HCT 27.3 (L) 09/03/2021 0419   PLT 186 09/03/2021 0419   MCV 101.5 (H) 09/03/2021 0419   MCH 31.6 09/03/2021 0419   MCHC 31.1 09/03/2021 0419   RDW 17.0 (H) 09/03/2021 0419   LYMPHSABS 1.2 08/26/2021 0311   MONOABS 0.8 08/26/2021 0311   EOSABS 0.1 08/26/2021 0311   BASOSABS 0.0 08/26/2021 0311    BMET    Component Value Date/Time   NA 132 (L) 09/03/2021 0419   NA 134 (A) 01/04/2018 0000   K 3.8 09/03/2021 0419   CL 99 09/03/2021 0419   CO2 25 09/03/2021 0419   GLUCOSE 206 (H) 09/03/2021 0419   BUN 18 09/03/2021 0419   BUN 17 01/04/2018 0000   CREATININE 0.75 09/03/2021 0419   CREATININE 0.90 06/26/2014 1108   CALCIUM 8.5 (L) 09/03/2021 0419   GFRNONAA >60 09/03/2021 0419   GFRAA 58 (L) 04/12/2020 1400    INR    Component Value Date/Time   INR 1.2 01/31/2021 0143     Intake/Output Summary (Last 24 hours) at 09/03/2021 0744 Last data filed at 09/03/2021 0412 Gross per 24 hour  Intake 773.16 ml  Output 1150 ml  Net -376.84 ml     Assessment/Plan:  66 y.o. female with occluded bilateral common iliac and common femoral arteries * No surgery found *   -Subjectively, patient is without rest pain of BLE despite bilateral occluded common iliac and common femoral arteries -Plans noted for cardiac cath via R radial access 09/05/21 -No plans for revascularization currently however will continue to monitor for worsening leg pain    Dagoberto Ligas, PA-C [facility] [phone] 09/03/2021 7:44 AM

## 2021-09-03 NOTE — Progress Notes (Signed)
PROGRESS NOTE    Katherine [name]  BLT:[id number] DOB: 17-Apr-1956 DOA: 09/02/2021 PCP: [name], MD    No chief complaint on file.   Brief Narrative:  CAD s/p CABG and PCI, bilateral carotid artery stenosis, pvd, paf, COPD home oxygen dependent on 3 L, DM type II, s/p renal transplant,  urolithiasis, anemia, history of GI bleed,fibromyalgia/chronic pain , She had been admitted on 07/25/2021-08/25/2021, for COVID with MRSA pneumonia.  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  Since the cardiac cath was attempted patient reports that she has had right leg pain at rest.  She is seen by vascular surgery we will plan to proceed with intervention, patient understands she is high risk for any type of procedure but she elects to proceed, she is discharged from CIR and admitted to hospitalist service  Subjective:  She is laying in bed, having breakfast, she states Right leg pain is well controlled currently She is tolerating heparin drip  Assessment & Plan:   Active Problems:   Dyslipidemia, goal LDL below 70   CAD S/P percutaneous coronary angioplasty - PCI x 5 to SVG-D1   Debility   Chronic respiratory failure with hypoxia (HCC)   COPD (chronic obstructive pulmonary disease) (HCC)   History of renal transplant   Obesity (BMI 30-39.9)   Chronic diastolic CHF (congestive heart failure) (HCC)   Rest pain of lower extremity due to atherosclerosis (HCC)  Peripheral artery disease.  Right leg Currently on heparin drip Plavix and statin No plans for revascularization currently ,Management per vascular surgery  Chronic A. Fib Currently in A. fib, rate controlled on beta-blocker apixaban held in anticipation for vascular intervention to right leg, currently on heparin drip  CAD. S/p CABG Recent NSTEMI in last  hospitalization, failed cardiac cath attempt due to lack of access  On Plavix, statin, imdur Currently on heparin drip due to holding apixaban Currently denies chest pain Seen by cardiology who plan to do diagnostic coronary angiography via the right radial approach by Dr. Ellyn Hack on Friday  Chronic combined CHF Last LVEF 45 to 50% in 07/2021 She received IV Lasix yesterday with good urine output Currently does not appear overtly volume overloaded Continue monitor volume status  COPD chronic hypoxic respiratory failure on home O2   Insulin-dependent type 2 diabetes Hold metformin due to possible procedure Continue adjust insulin as needed Continue Tradjenta  Renal transplant Back in 1991.  Patient is followed by Dr. Johnney Ou in the outpatient setting On Imuran and chronic prednisone Creatinine stable    Body mass index is 38.36 kg/m.[name] Kitchen    Unresulted Labs (From admission, onward)     Start     Ordered   09/03/21 1200  APTT  Once-Timed,   TIMED        09/03/21 0738   09/03/21 0500  Heparin level (unfractionated)  Daily,   R     Question:  Specimen collection method  Answer:  Unit=Unit collect   09/02/21 1425   09/03/21 0500  CBC  Daily,   R     Question:  Specimen collection method  Answer:  Unit=Unit collect   09/02/21 1425              DVT prophylaxis:   Currently on heparin drip   Code Status: DNR Family Communication: Patient Disposition:   Status  is: Inpatient  Dispo: The patient is from: Inpatient rehab              Anticipated d/c is to: TBD              Anticipated d/c date is: TBD                 Consultants:  Vascular surgery cardiology   Procedures:  Plan for cardiac cath   Antimicrobials:   Anti-infectives (From admission, onward)    None           Objective: Vitals:   09/02/21 2018 09/02/21 2021 09/03/21 0005 09/03/21 0406  BP:   131/61 (!) 93/50  Pulse:   98 70  Resp:   18 20  Temp:   100.1 F (37.8 C) 99 F (37.2 C)   TempSrc:   Oral Oral  SpO2: 94% 97% 94% 95%  Weight:    92.1 kg  Height:        Intake/Output Summary (Last 24 hours) at 09/03/2021 0750 Last data filed at 09/03/2021 0412 Gross per 24 hour  Intake 773.16 ml  Output 1150 ml  Net -376.84 ml   Filed Weights   09/02/21 1524 09/03/21 0406  Weight: 92.3 kg 92.1 kg    Examination:  General exam: alert, awake, communicative,calm, NAD Respiratory system: mild intermittent crackles. Respiratory effort normal. Cardiovascular system:  RRR.  Gastrointestinal system: Abdomen is nondistended, soft and nontender.  Normal bowel sounds heard. Central nervous system: Alert and oriented. No focal neurological deficits. Extremities:  no edema Skin: No rashes, lesions or ulcers Psychiatry: Judgement and insight appear normal. Mood & affect appropriate.     Data Reviewed: I have personally reviewed following labs and imaging studies  CBC: Recent Labs  Lab 09/01/21 0345 09/02/21 0447 09/03/21 0419  WBC 5.4 7.0 5.7  HGB 8.6* 9.2* 8.5*  HCT 28.4* 29.7* 27.3*  MCV 104.8* 103.8* 101.5*  PLT 137* 173 034    Basic Metabolic Panel: Recent Labs  Lab 09/01/21 0345 09/03/21 0419  NA 135 132*  K 4.0 3.8  CL 101 99  CO2 28 25  GLUCOSE 284* 206*  BUN 23 18  CREATININE 0.75 0.75  CALCIUM 8.8* 8.5*    GFR: Estimated Creatinine Clearance: 73.5 mL/min (by C-G formula based on SCr of 0.75 mg/dL).  Liver Function Tests: No results for input(s): AST, ALT, ALKPHOS, BILITOT, PROT, ALBUMIN in the last 168 hours.  CBG: Recent Labs  Lab 09/02/21 2000 09/02/21 2030 09/02/21 2056 09/03/21 0539 09/03/21 0744  GLUCAP 63* 68* 84 184* 169*     No results found for this or any previous visit (from the past 240 hour(s)).       Radiology Studies: DG Knee 1-2 Views Right  Result Date: 09/01/2021 CLINICAL DATA:  Hervey Ard right knee pain, no injury. EXAM: RIGHT KNEE - 1-2 VIEW COMPARISON:  None. FINDINGS: Lateral view is slightly rotated. No  definite joint effusion. No fracture. Chondrocalcinosis in the lateral compartment. Scattered surgical clips in the soft tissues. IMPRESSION: 1. No acute findings. 2. Chondrocalcinosis in the lateral compartment of the knee. Electronically Signed   By: Lorin Picket M.D.   On: 09/01/2021 11:41   DG CHEST PORT 1 VIEW  Result Date: 09/02/2021 CLINICAL DATA:  Short of breath EXAM: PORTABLE CHEST 1 VIEW COMPARISON:  None. FINDINGS: Sternotomy wires overlie normal cardiac silhouette. Patient rotated leftward. Diffuse patchy airspace opacities are similar comparison exam. No pleural fluid. No pneumothorax. IMPRESSION: No change in diffuse  patchy bilateral airspace disease consistent with multifocal pneumonia versus pulmonary edema. Electronically Signed   By: Suzy Bouchard M.D.   On: 09/02/2021 09:25   VAS US CAROTID  Result Date: 09/02/2021 Carotid Arterial Duplex Study Patient Name:  ROE KOFFMAN  Date of Exam:   09/02/2021 Medical Rec #: [id number]           Accession #:    [id number] Date of Birth: 07/31/1956           Patient Gender: F Patient Age:   66 years Exam Location:  [facility] Procedure:      VAS US CAROTID Referring Phys: Jenkins Rouge --------------------------------------------------------------------------------  Risk Factors:      Hypertension, hyperlipidemia, Diabetes, current smoker,                    coronary artery disease. Limitations        Today's exam was limited due to the body habitus of the                    patient and patient position. Comparison Study:  No prior study Performing Technologist: Maudry Mayhew MHA, RDMS, RVT, RDCS  Examination Guidelines: A complete evaluation includes B-mode imaging, spectral Doppler, color Doppler, and power Doppler as needed of all accessible portions of each vessel. Bilateral testing is considered an integral part of a complete examination. Limited examinations for reoccurring indications may be performed as noted.  Right  Carotid Findings: +----------+--------+--------+--------+-------------------------+---------+             PSV cm/s EDV cm/s Stenosis Plaque Description        Comments   +----------+--------+--------+--------+-------------------------+---------+  CCA Prox   52       20                                                     +----------+--------+--------+--------+-------------------------+---------+  CCA Distal 55       12                calcific                  Shadowing  +----------+--------+--------+--------+-------------------------+---------+  ICA Prox   193      57       40-59%   heterogenous and calcific Shadowing  +----------+--------+--------+--------+-------------------------+---------+  ICA Distal 75       17                                                     +----------+--------+--------+--------+-------------------------+---------+  ECA        93       5                                                      +----------+--------+--------+--------+-------------------------+---------+ +----------+--------+-------+--------------+-------------------+             PSV cm/s EDV cms Describe       Arm Pressure (mmHG)  +----------+--------+-------+--------------+-------------------+  Subclavian  Not identified                      +----------+--------+-------+--------------+-------------------+ +---------+--------+--------+--------------+  Vertebral PSV cm/s EDV cm/s Not identified  +---------+--------+--------+--------------+  Left Carotid Findings: +----------+--------+--------+--------+------------------------------+---------+             PSV cm/s EDV cm/s Stenosis Plaque Description             Comments   +----------+--------+--------+--------+------------------------------+---------+  CCA Prox   95       19                                                          +----------+--------+--------+--------+------------------------------+---------+  CCA Distal 77       24                                                           +----------+--------+--------+--------+------------------------------+---------+  ICA Prox   151      41       40-59%   heterogenous, irregular and    Shadowing                                         calcific                                  +----------+--------+--------+--------+------------------------------+---------+  ICA Distal 137      38                                                          +----------+--------+--------+--------+------------------------------+---------+  ECA        140      34                heterogenous and calcific      shadowing  +----------+--------+--------+--------+------------------------------+---------+ +----------+--------+--------+--------------+-------------------+             PSV cm/s EDV cm/s Describe       Arm Pressure (mmHG)  +----------+--------+--------+--------------+-------------------+  Subclavian                   Not identified                      +----------+--------+--------+--------------+-------------------+ +---------+--------+--+--------+--+---------+  Vertebral PSV cm/s 56 EDV cm/s 20 Antegrade  +---------+--------+--+--------+--+---------+   Summary: Right Carotid: Velocities in the right ICA are consistent with a 40-59%                stenosis. Left Carotid: Velocities in the left ICA are consistent with a 40-59% stenosis. Vertebrals:  Left vertebral artery demonstrates antegrade flow. Right vertebral              artery was not visualized. 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 *See table(s) above for measurements and observations.  Electronically signed by Deitra Mayo MD on 09/02/2021 at 3:24:09 PM.    Final         Scheduled Meds:  (feeding supplement) PROSource Plus  30 mL Oral BID BM   arformoterol  15 mcg Nebulization BID   vitamin C  500 mg Oral Daily   azaTHIOprine  125 mg Oral Daily   budesonide  2 mL Nebulization BID   [START ON 09/04/2021] calcitRIOL  0.25 mcg Oral Q3 days    Chlorhexidine Gluconate Cloth  6 each Topical Daily   clopidogrel  75 mg Oral Daily   diclofenac Sodium  2 g Topical TID   fluticasone  2 spray Each Nare Daily   gabapentin  100 mg Oral QHS   guaiFENesin-dextromethorphan  10 mL Oral QID   hydrocerin   Topical BID   hydrocortisone  25 mg Rectal BID   insulin aspart  0-9 Units Subcutaneous TID WC   insulin aspart  5 Units Subcutaneous TID WC   insulin glargine-yfgn  20 Units Subcutaneous Daily   isosorbide dinitrate  10 mg Oral BID   lamoTRIgine  25 mg Oral QHS   linaclotide  145 mcg Oral QAC breakfast   linagliptin  5 mg Oral Daily   loratadine  10 mg Oral Daily   magic mouthwash  5 mL Oral QID   metoprolol tartrate  25 mg Oral TID   montelukast  10 mg Oral QHS   multivitamin with minerals  1 tablet Oral Daily   nutrition supplement (JUVEN)  1 packet Oral BID BM   nystatin   Topical TID   pantoprazole  40 mg Oral Daily   polyethylene glycol  17 g Oral TID   predniSONE  5 mg Oral Q breakfast   Ensure Max Protein  11 oz Oral TID BM   rosuvastatin  20 mg Oral Daily   senna-docusate  2 tablet Oral BID   sertraline  100 mg Oral Daily   sodium chloride flush  10-40 mL Intracatheter Q12H   terbinafine   Topical BID   zinc sulfate  220 mg Oral Daily   Continuous Infusions:  heparin 950 Units/hr (09/03/21 0542)     LOS: 1 day   Time spent: 84mins Greater than 50% of this time was spent in counseling, explanation of diagnosis, planning of further management, and coordination of care.   Voice Recognition Viviann Spare dictation system was used to create this note, attempts have been made to correct errors. Please contact the author with questions and/or clarifications.   Florencia Reasons, MD PhD FACP [facility]  Available via Epic secure chat 7am-7pm for nonurgent issues Please page for urgent issues To page the attending provider between 7A-7P or the covering provider during after hours 7P-7A, please log into the web site  [email or web address] and access using universal [place] password for that web site. If you do not have the password, please call the hospital operator.    09/03/2021, 7:50 AM

## 2021-09-03 NOTE — Progress Notes (Signed)
PT Cancellation Note  Patient Details Name: Katherine [name] MRN: [id number] DOB: February 27, 1956   Cancelled Treatment:    Reason Eval/Treat Not Completed: (P) Patient declined, no reason specified Pt refuses Evaluation, initially stating "Dr Gwenlyn Found said if I don't want to move because of pain in my legs I don't have to." Educated pt to fact that she can move in bed and UE. Pt then refuses because she has visitors. PT will follow back for Evaluation this afternoon as able.   Josephus Harriger B. [name] PT, DPT [facility] Services Pager [phone] Office [phone]    [place] 09/03/2021, 11:23 AM

## 2021-09-03 NOTE — Evaluation (Signed)
Occupational Therapy Evaluation Patient Details Name: Katherine [name] MRN: [id number] DOB: 1956/01/17 Today's Date: 09/03/2021   History of Present Illness Pt is a 66 y/o F admitted 07/25/21-08/25/21 for COVID wtih MRSA, PNA, hospital course complicated by NSTEMI. PMH includes CAD s/p CABG and PCI, COPD at home on 3L O2 via [place], DM2, s/p renal transplant, anxiety, depression, anemia, fibromyalgia, and GERD. (Simultaneous filing. User may not have seen previous data.)   Clinical Impression   Pt reports independence at baseline with ADLs and functional mobility. Per chart review, pt lives with relatives who can assist 24/7 at d/c. Currently pt presenting with increased anxiety around therapy and mobility, afraid of developing chest/leg pain. Pt currently min-mod A for seated ADLs, supervision for bed mobility, able to sit unsupported EOB for ~10 minutes for grooming task and laterally scoot towards HOB at end of session. Educated pt on this importance of mobility to preserve strength, pt verbalizes understanding. Pt presenting with impairments listed below, will follow acutely. Recommend d/c home with [facility] pending progress.     Recommendations for follow up therapy are one component of a multi-disciplinary discharge planning process, led by the attending physician.  Recommendations may be updated based on patient status, additional functional criteria and insurance authorization.   Follow Up Recommendations  Home health OT    Assistance Recommended at Discharge Frequent or constant Supervision/Assistance  Patient can return home with the following A little help with walking and/or transfers;A little help with bathing/dressing/bathroom;Assistance with cooking/housework;Assist for transportation;Help with stairs or ramp for entrance    Functional Status Assessment  Patient has had a recent decline in their functional status and demonstrates the ability to make significant improvements in function in  a reasonable and predictable amount of time.  Equipment Recommendations  Other (comment);None recommended by OT (defer to next venue of care)    Recommendations for Other Services PT consult     Precautions / Restrictions Precautions Precautions: Fall (Simultaneous filing. User may not have seen previous data.) Restrictions Weight Bearing Restrictions: No      Mobility Bed Mobility Overal bed mobility: Needs Assistance (Simultaneous filing. User may not have seen previous data.) Bed Mobility: Supine to Sit, Sit to Supine     Supine to sit: Supervision Sit to supine: Supervision   General bed mobility comments: able to scoot self laterally to move toward [facility] (Simultaneous filing. User may not have seen previous data.)    Transfers                   General transfer comment: deferred transfers due to pt anxiety      Balance Overall balance assessment: Needs assistance Sitting-balance support: Feet supported Sitting balance-Leahy Scale: Good Sitting balance - Comments: sits unsupported EOB for ~10 minutes, able to reach outside BOS                                   ADL either performed or assessed with clinical judgement   ADL Overall ADL's : Needs assistance/impaired Eating/Feeding: Independent;Sitting   Grooming: Brushing hair;Moderate assistance;Wash/dry face Grooming Details (indicate cue type and reason): mod A to style hair, pt able to simulate brushing Upper Body Bathing: Moderate assistance;Maximal assistance Upper Body Bathing Details (indicate cue type and reason): to apply lotion to back Lower Body Bathing: Sitting/lateral leans;Moderate assistance   Upper Body Dressing : Minimal assistance;Sitting   Lower Body Dressing: Minimal assistance Lower Body Dressing  Details (indicate cue type and reason): able to simulate donning socks using figure 4 technique Toilet Transfer: Moderate assistance;BSC/3in1;Stand-pivot;Rolling walker (2  wheels)   Toileting- Clothing Manipulation and Hygiene: Moderate assistance;Sit to/from stand       Functional mobility during ADLs: Minimal assistance;Moderate assistance       Vision Baseline Vision/History: 1 Wears glasses Vision Assessment?: No apparent visual deficits     Perception     Praxis      Pertinent Vitals/Pain Pain Assessment Pain Assessment: No/denies pain     Hand Dominance Right (Simultaneous filing. User may not have seen previous data.)   Extremity/Trunk Assessment Upper Extremity Assessment Upper Extremity Assessment: Overall WFL for tasks assessed   Lower Extremity Assessment Lower Extremity Assessment: Defer to PT evaluation       Communication Communication Communication: No difficulties (Simultaneous filing. User may not have seen previous data.)   Cognition Arousal/Alertness: Awake/alert (Simultaneous filing. User may not have seen previous data.) Behavior During Therapy: Anxious, WFL for tasks assessed/performed (Simultaneous filing. User may not have seen previous data.) Overall Cognitive Status: Within Functional Limits for tasks assessed (Simultaneous filing. User may not have seen previous data.)                                 General Comments: empathized with pt and let pt allow limits of session due to increased anxiety noted with therapy and PT notes regarding pt being tearful/emotional with therapy sessions (Simultaneous filing. User may not have seen previous data.)     General Comments  Educated pt on the importance of therapy at this time, and the risk of weakness from remaining in the bed. VSS throughout session    Exercises     Shoulder Instructions      Home Living Family/patient expects to be discharged to:: Private residence (Simultaneous filing. User may not have seen previous data.) Living Arrangements: Other relatives (moved in with brother in september  Simultaneous filing. User may not have seen  previous data.) Available Help at Discharge: Family;Available 24 hours/day (Simultaneous filing. User may not have seen previous data.) Type of Home: House (Simultaneous filing. User may not have seen previous data.) Home Access: Stairs to enter (Simultaneous filing. User may not have seen previous data.) Entrance Stairs-Number of Steps: 5 (Simultaneous filing. User may not have seen previous data.) Entrance Stairs-Rails: Left (Simultaneous filing. User may not have seen previous data.) Home Layout: One level (Simultaneous filing. User may not have seen previous data.)     Bathroom Shower/Tub: Tub/shower unit (Simultaneous filing. User may not have seen previous data.)   Bathroom Toilet: Standard (Simultaneous filing. User may not have seen previous data.) Bathroom Accessibility: No (cannot get RW in bathroom  Simultaneous filing. User may not have seen previous data.)   Home Equipment: Rollator (4 wheels);Cane - single point;Tub bench;Grab bars - toilet;Wheelchair - manual (Simultaneous filing. User may not have seen previous data.)   Additional Comments: Has BSC that she keeps in her bathtub, furniture walks around her house, wears 3L supplemental O2 at home (Simultaneous filing. User may not have seen previous data.)  Lives With: Family (Simultaneous filing. User may not have seen previous data.)    Prior Functioning/Environment Prior Level of Function : Independent/Modified Independent (Simultaneous filing. User may not have seen previous data.)             Mobility Comments: reports not using AD for mobility, furniture walks (Simultaneous  filing. User may not have seen previous data.) ADLs Comments: enjoys singing and dancing (Simultaneous filing. User may not have seen previous data.)        OT Problem List: Decreased strength;Decreased range of motion;Decreased activity tolerance;Decreased knowledge of use of DME or AE;Cardiopulmonary status limiting activity      OT  Treatment/Interventions: Self-care/ADL training;Therapeutic exercise;DME and/or AE instruction;Therapeutic activities;Balance training;Patient/family education    OT Goals(Current goals can be found in the care plan section) Acute Rehab OT Goals Patient Stated Goal: to have procedure on friday OT Goal Formulation: With patient Time For Goal Achievement: 09/17/21 Potential to Achieve Goals: Good ADL Goals Pt Will Perform Lower Body Dressing: with mod assist;sit to/from stand Pt Will Transfer to Toilet: with mod assist;regular height toilet;ambulating Pt Will Perform Tub/Shower Transfer: with mod assist;ambulating;rolling walker  OT Frequency: Min 2X/week    Co-evaluation              AM-PAC OT "6 Clicks" Daily Activity     Outcome Measure Help from another person eating meals?: None Help from another person taking care of personal grooming?: A Little Help from another person toileting, which includes using toliet, bedpan, or urinal?: A Lot Help from another person bathing (including washing, rinsing, drying)?: A Lot Help from another person to put on and taking off regular upper body clothing?: A Little Help from another person to put on and taking off regular lower body clothing?: A Lot 6 Click Score: 16   End of Session Equipment Utilized During Treatment: Oxygen Nurse Communication: Mobility status  Activity Tolerance: Other (comment) (session limited by pt anxiety) Patient left: in bed;with call bell/phone within reach;with bed alarm set  OT Visit Diagnosis: Muscle weakness (generalized) (M62.81)                Time: 8032-1224 OT Time Calculation (min): 40 min Charges:  OT General Charges $OT Visit: 1 Visit OT Evaluation $OT Eval Low Complexity: 1 Low OT Treatments $Self Care/Home Management : 23-37 mins [name], OTD, OTR/L Acute Rehab [phone] 09/03/2021, 5:28 PM

## 2021-09-03 NOTE — Progress Notes (Signed)
ANTICOAGULATION CONSULT NOTE  Pharmacy Consult for heparin Indication: chest pain/ACS and atrial fibrillation Brief A/P: aPTT subtherapeutic  Increase Heparin rate  Allergies  Allergen Reactions   Tetracycline Hives    Patient tolerated Doxycycline Dec 2020   Niacin Other (See Comments)    Mouth blisters   Niaspan [Niacin Er] Other (See Comments)    Mouth blisters   Sulfa Antibiotics Nausea Only and Other (See Comments)    "Tears up stomach"   Sulfonamide Derivatives Other (See Comments)    Reaction: per patient "tears her stomach up"   Codeine Nausea And Vomiting   Erythromycin Nausea And Vomiting   Hydromorphone Hcl Nausea And Vomiting   Morphine And Related Nausea And Vomiting   Nalbuphine Nausea And Vomiting    Nubain   Sulfasalazine Nausea Only and Other (See Comments)    per patient "tears her stomach up", "Tears up stomach"   Tape Rash and Other (See Comments)    No "plastic" tape," please----cloth tape only    Patient Measurements: Height: 5\' 1"  (154.9 cm) Weight: 92.1 kg (203 lb 0.7 oz) IBW/kg (Calculated) : 47.8 Heparin Dosing Weight: 66kg  Vital Signs: Temp: 99 F (37.2 C) (01/18 0406) Temp Source: Oral (01/18 0406) BP: 93/50 (01/18 0406) Pulse Rate: 70 (01/18 0406)  Labs: Recent Labs    09/01/21 0345 09/01/21 1826 09/02/21 0447 09/03/21 0419  HGB 8.6*  --  9.2* 8.5*  HCT 28.4*  --  29.7* 27.3*  PLT 137*  --  173 186  APTT  --   --   --  46*  HEPARINUNFRC  --   --   --  >1.10*  CREATININE 0.75  --   --  0.75  TROPONINIHS  --  52* 66*  --      Estimated Creatinine Clearance: 73.5 mL/min (by C-G formula based on SCr of 0.75 mg/dL).  Assessment: 66 y.o. female with h/o Afib, Eliquis on hold, for heparin   Goal of Therapy:  Heparin level 0.3-0.7 units/ml aPTT 66-102 seconds Monitor platelets by anticoagulation protocol: Yes   Plan:  Increase Heparin 950 units/hr aPTT in 8 hours  Phillis Knack, PharmD, BCPS  09/03/2021 5:36 AM

## 2021-09-03 NOTE — Progress Notes (Signed)
[facility] for apixaban to heparin Indication: chest pain/ACS and atrial fibrillation  Allergies  Allergen Reactions   Tetracycline Hives    Patient tolerated Doxycycline Dec 2020   Niacin Other (See Comments)    Mouth blisters   Niaspan [Niacin Er] Other (See Comments)    Mouth blisters   Sulfa Antibiotics Nausea Only and Other (See Comments)    "Tears up stomach"   Sulfonamide Derivatives Other (See Comments)    Reaction: per patient "tears her stomach up"   Codeine Nausea And Vomiting   Erythromycin Nausea And Vomiting   Hydromorphone Hcl Nausea And Vomiting   Morphine And Related Nausea And Vomiting   Nalbuphine Nausea And Vomiting    Nubain   Sulfasalazine Nausea Only and Other (See Comments)    per patient "tears her stomach up", "Tears up stomach"   Tape Rash and Other (See Comments)    No "plastic" tape," please----cloth tape only    Patient Measurements: Height: 5\' 1"  (154.9 cm) Weight: 92.1 kg (203 lb 0.7 oz) IBW/kg (Calculated) : 47.8 Heparin Dosing Weight: 66kg  Vital Signs: Temp: 99 F (37.2 C) (01/18 0406) Temp Source: Oral (01/18 0406) BP: 93/50 (01/18 0406) Pulse Rate: 70 (01/18 0406)  Labs: Recent Labs    09/01/21 0345 09/01/21 1826 09/02/21 0447 09/03/21 0419  HGB 8.6*  --  9.2* 8.5*  HCT 28.4*  --  29.7* 27.3*  PLT 137*  --  173 186  APTT  --   --   --  46*  HEPARINUNFRC  --   --   --  >1.10*  CREATININE 0.75  --   --  0.75  TROPONINIHS  --  52* 66*  --      Estimated Creatinine Clearance: 73.5 mL/min (by C-G formula based on SCr of 0.75 mg/dL).   Medical History: Past Medical History:  Diagnosis Date   Anemia    Anxiety    Bilateral carotid artery stenosis    Carotid duplex 11/6268: 3-50% LICA, 09-38% RICA, >18% RECA, f/u 1 yr suggested   CAD (coronary artery disease) of bypass graft 5/01; 3/'02, 8/'03, 10/'04; 1/15   PCI x 5 to SVG-D1    CAD in native artery 07/1993   3 Vessel Disease (LAD-D1  & RCA) -- CABG (Dx in setting of inferior STEMI-PTCA of RCA)   CAD S/P percutaneous coronary angioplasty    PCI to SVG-D1 insertion/native D1 x 4 = '01 -(S660 BMS 2.5 x 9 anastomosis- D1); '02 - distal overlap ACS Pixel 2.5 x 8  BMS; '03 distal/native ISR/Thrombosis - Pixel 2.5 x 13; '04 - ISR-  Taxus 2.5 x 20 (covered all);; 1/15 - mid SVG-D1 (50% distal ISR) - Promus P 2.75 x 20 -- 2.8 mm; 6/22: Extensive ISR PTCA & Anastomotic-Native Diag DES PCI ([place] Onyx 2.25x12 (2.75-2.5 mm post-dilation   COPD mixed type (HCC)    Followed by Dr. Lamonte Sakai "pulmonologist said no COPD"   Depression with anxiety    Diabetes mellitus type 2 in obese ([place])    Diarrhea    started after cholecystectomy and mass removed from intestine   Dyslipidemia, goal LDL below 70    08/2012: TC 137, TG 200, HDL 32!, LDL 45; on statin (followed by Dr.Deterding)   ESRD (end stage renal disease) ([place]) 1991   s/p Cadaveric Renal Transplant [facility] - Dr. Jimmy Footman)    Family history of adverse reaction to anesthesia    mom's bp dropped during/after anesthesia   Fibromyalgia  GERD (gastroesophageal reflux disease)    Glomerulonephritis, chronic, rapidly progressive 1989   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 07/1993   Rescue PTCA of RCA -- referred for CABG.   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  Had having having lots of nausea and vomiting with poor p.o. intake.  Thought to have QT prolongation with multiple medications involved and hypomagnesemia, hypokalemia.  Tikosyn discontinued along with Zoloft and Phenergan.   Hypertension associated with diabetes ([place])    Mild aortic stenosis by prior echocardiogram 07/2019    Echo:  Mild aortic stenosis (gradients: Mean 14.3 mmHg -peak 24.9 mmHg).   Morbid obesity (HCC)    MRSA (methicillin resistant staph aureus) culture positive    OSA (obstructive sleep apnea)    no longer on CPAP or home O2, states she doesn't need now after lap band   PAD (peripheral artery disease) ([place]) 08/2013   LEA Dopplers to be read by Dr. Fletcher Anon   PAF (paroxysmal atrial fibrillation) ([place]) 06/2014   Noted on CardioNet Monitor  - --> rhythm control with Tikosyn (Dr. Rayann Heman); converted from warfarin to apixaban for anticoagulation.   Pneumonia    Recurrent boils    Bilateral Groin   Rheumatoid arthritis ([place])    Per Patient Report; associated with OA   S/p cadaver renal transplant 1991   [facility]   Torsades de pointes 05/12/2021   Unstable angina ([place]) 5/01; 3/'02, 8/'03, 10/'04; 1/15   x 5 occurences since Inf-Post STEMI in 1994   Assessment: 66 year old female previously on inpatient rehab now admitted to progressive unit d/t multiple medical issues. Cardiology consulted yesterday due to new chest pain and afib with rvr. Patient has been on apixaban since 1/6. New orders to transition back to IV heparin with plans for cath on Friday.   Aptt >200 on recheck this afternoon. Appears to be drawn appropriately per rn.  Current hemoglobin 8.5, plt 186. No bleeding issues have been noted.   Goal of Therapy:  Heparin level 0.3-0.7 units/ml aPTT 66-102 seconds Monitor platelets by anticoagulation protocol: Yes   Plan:  Hold heparin for 2 hours Restart at previous rate of 700 units/hr and recheck aptt  Erin Hearing PharmD., BCPS Clinical Pharmacist 09/03/2021 8:42 AM

## 2021-09-03 NOTE — Progress Notes (Signed)
Inpatient Rehabilitation Discharge Medication Review by a Pharmacist  A complete drug regimen review was completed for this patient to identify any potential clinically significant medication issues.  High Risk Drug Classes Is patient taking? Indication by Medication  Antipsychotic Yes Compazine for nausea  Anticoagulant Yes Eliquis for afib, h/o CVA  Antibiotic No   Opioid Yes Morphine, Oxycodone for RA/FM w/chronic pain,   Antiplatelet Yes Plavix s/p NSTEMi with CAD, PAD,  Hypoglycemics/insulin Yes Semglee, SSI, Meal coverage, linagliptin for DM  Vasoactive Medication Yes Isosorbide for HTN  Chemotherapy Yes Azathioprine/prednisone for kidney transplant  Other Yes Lamictal for neuropathy. Chronic Prednisone+ Azathioprine for kidney transplant Zoloft for depression Crestor HLD Calcitriol ESRD Flexeril spasms Brovana/pulmicort - COPD Flonase allergy Lamictal neuropathy Protonix GERD     Type of Medication Issue Identified Description of Issue Recommendation(s)  Drug Interaction(s) (clinically significant)     Duplicate Therapy     Allergy     No Medication Administration End Date     Incorrect Dose     Additional Drug Therapy Needed     Significant med changes from prior encounter (inform family/care partners about these prior to discharge).    Other       Clinically significant medication issues were identified that warrant physician communication and completion of prescribed/recommended actions by midnight of the next day:  No  Name of provider notified for urgent issues identified:  Provider Method of Notification:   Pharmacist comments:  Time spent performing this drug regimen review (minutes): 30 min  Onnie Boer, PharmD, [place], AAHIVP, CPP Infectious Disease Pharmacist 09/03/2021 10:50 AM

## 2021-09-03 NOTE — Progress Notes (Signed)
Inpatient Diabetes Program Recommendations  AACE/ADA: New Consensus Statement on Inpatient Glycemic Control (2015)  Target Ranges:  Prepandial:   less than 140 mg/dL      Peak postprandial:   less than 180 mg/dL (1-2 hours)      Critically ill patients:  140 - 180 mg/dL   Lab Results  Component Value Date   GLUCAP 169 (H) 09/03/2021   HGBA1C 7.9 (H) 08/11/2021    Review of Glycemic Control  Latest Reference Range & Units 09/02/21 06:16 09/02/21 11:53 09/02/21 16:31 09/02/21 20:00 09/02/21 20:30 09/02/21 20:56 09/03/21 05:39 09/03/21 07:44  Glucose-Capillary 70 - 99 mg/dL 243 (H) 255 (H) 227 (H) 63 (L) 68 (L) 84 184 (H) 169 (H)   Diabetes history: DM 2 Outpatient Diabetes medications: Amaryl 4 mg Daily, Novolog 4-10 units tid, Basaglar 20 units Daily, Metformin 500 mg bid Current orders for Inpatient glycemic control:  Tradjenta 5 mg Daily Semglee 20 units Daily Novolog 5 units tid meal coverage Novolog 0-9 units tid  Ensure max tid between meals A1c 7.9% on 08/11/21  Inpatient Diabetes Program Recommendations:    Mild hypo after meal coverage + correction Novolog yesterday  -   Consider reducing Novolog meal coverage to 3 units tid if eating >50% of meals.  Thanks,  Tama Headings RN, MSN, BC-ADM Inpatient Diabetes Coordinator Team Pager [phone] (8a-5p)

## 2021-09-03 NOTE — Progress Notes (Addendum)
Progress Note  Patient Name: Katherine [name] Date of Encounter: 09/03/2021  [facility] Cardiologist: [name], MD   Subjective   No specific complaint this morning. Getting a neb treatment   Inpatient Medications    Scheduled Meds:  (feeding supplement) PROSource Plus  30 mL Oral BID BM   arformoterol  15 mcg Nebulization BID   vitamin C  500 mg Oral Daily   azaTHIOprine  125 mg Oral Daily   budesonide  2 mL Nebulization BID   [START ON 09/04/2021] calcitRIOL  0.25 mcg Oral Q3 days   Chlorhexidine Gluconate Cloth  6 each Topical Daily   clopidogrel  75 mg Oral Daily   diclofenac Sodium  2 g Topical TID   fluticasone  2 spray Each Nare Daily   gabapentin  100 mg Oral QHS   guaiFENesin-dextromethorphan  10 mL Oral QID   hydrocerin   Topical BID   hydrocortisone  25 mg Rectal BID   insulin aspart  0-9 Units Subcutaneous TID WC   insulin aspart  5 Units Subcutaneous TID WC   insulin glargine-yfgn  20 Units Subcutaneous Daily   isosorbide dinitrate  10 mg Oral BID   lamoTRIgine  25 mg Oral QHS   linaclotide  145 mcg Oral QAC breakfast   linagliptin  5 mg Oral Daily   loratadine  10 mg Oral Daily   magic mouthwash  5 mL Oral QID   metoprolol tartrate  25 mg Oral TID   montelukast  10 mg Oral QHS   multivitamin with minerals  1 tablet Oral Daily   nutrition supplement (JUVEN)  1 packet Oral BID BM   nystatin   Topical TID   pantoprazole  40 mg Oral Daily   polyethylene glycol  17 g Oral TID   predniSONE  5 mg Oral Q breakfast   Ensure Max Protein  11 oz Oral TID BM   rosuvastatin  20 mg Oral Daily   senna-docusate  2 tablet Oral BID   sertraline  100 mg Oral Daily   sodium chloride flush  10-40 mL Intracatheter Q12H   terbinafine   Topical BID   zinc sulfate  220 mg Oral Daily   Continuous Infusions:  heparin 950 Units/hr (09/03/21 0542)   PRN Meds: acetaminophen, alum & mag hydroxide-simeth, bisacodyl, cyclobenzaprine, diphenhydrAMINE,  guaiFENesin-dextromethorphan, hydrALAZINE, ipratropium-albuterol, LORazepam, melatonin, menthol-cetylpyridinium, morphine injection, nitroGLYCERIN, oxyCODONE, polyethylene glycol, prochlorperazine **OR** prochlorperazine **OR** prochlorperazine, sodium chloride, sodium chloride flush, sodium phosphate   Vital Signs    Vitals:   09/02/21 2018 09/02/21 2021 09/03/21 0005 09/03/21 0406  BP:   131/61 (!) 93/50  Pulse:   98 70  Resp:   18 20  Temp:   100.1 F (37.8 C) 99 F (37.2 C)  TempSrc:   Oral Oral  SpO2: 94% 97% 94% 95%  Weight:    92.1 kg  Height:        Intake/Output Summary (Last 24 hours) at 09/03/2021 4920 Last data filed at 09/03/2021 0800 Gross per 24 hour  Intake 773.16 ml  Output 2250 ml  Net -1476.84 ml   Last 3 Weights 09/03/2021 09/02/2021 09/02/2021  Weight (lbs) 203 lb 0.7 oz 203 lb 7.8 oz 203 lb 7.8 oz  Weight (kg) 92.1 kg 92.3 kg 92.3 kg      Telemetry    No new tracing this morning  ECG    Atrial flutter, rate controlled - Personally Reviewed  Physical Exam  Pleasant, obese older female GEN: No  acute distress, wearing [place] @3L    Neck: No JVD Cardiac: Irreg, no murmurs, rubs, or gallops.  Respiratory: Clear to auscultation bilaterally. GI: Soft, nontender, non-distended  MS: No edema; No deformity. Neuro:  Nonfocal  Psych: Normal affect   Labs    High Sensitivity Troponin:   Recent Labs  Lab 08/14/21 1845 08/19/21 1003 08/19/21 1135 09/01/21 1826 09/02/21 0447  TROPONINIHS 351* 93* 76* 52* 66*     Chemistry Recent Labs  Lab 09/01/21 0345 09/03/21 0419  NA 135 132*  K 4.0 3.8  CL 101 99  CO2 28 25  GLUCOSE 284* 206*  BUN 23 18  CREATININE 0.75 0.75  CALCIUM 8.8* 8.5*  GFRNONAA >60 >60  ANIONGAP 6 8    Lipids No results for input(s): CHOL, TRIG, HDL, LABVLDL, LDLCALC, CHOLHDL in the last 168 hours.  Hematology Recent Labs  Lab 09/01/21 0345 09/02/21 0447 09/03/21 0419  WBC 5.4 7.0 5.7  RBC 2.71* 2.86* 2.69*  HGB 8.6* 9.2*  8.5*  HCT 28.4* 29.7* 27.3*  MCV 104.8* 103.8* 101.5*  MCH 31.7 32.2 31.6  MCHC 30.3 31.0 31.1  RDW 16.7* 16.5* 17.0*  PLT 137* 173 186   Thyroid No results for input(s): TSH, FREET4 in the last 168 hours.  BNP Recent Labs  Lab 09/02/21 0447  BNP 539.6*    DDimer No results for input(s): DDIMER in the last 168 hours.   Radiology    DG Knee 1-2 Views Right  Result Date: 09/01/2021 CLINICAL DATA:  Hervey Ard right knee pain, no injury. EXAM: RIGHT KNEE - 1-2 VIEW COMPARISON:  None. FINDINGS: Lateral view is slightly rotated. No definite joint effusion. No fracture. Chondrocalcinosis in the lateral compartment. Scattered surgical clips in the soft tissues. IMPRESSION: 1. No acute findings. 2. Chondrocalcinosis in the lateral compartment of the knee. Electronically Signed   By: Lorin Picket M.D.   On: 09/01/2021 11:41   DG CHEST PORT 1 VIEW  Result Date: 09/02/2021 CLINICAL DATA:  Short of breath EXAM: PORTABLE CHEST 1 VIEW COMPARISON:  None. FINDINGS: Sternotomy wires overlie normal cardiac silhouette. Patient rotated leftward. Diffuse patchy airspace opacities are similar comparison exam. No pleural fluid. No pneumothorax. IMPRESSION: No change in diffuse patchy bilateral airspace disease consistent with multifocal pneumonia versus pulmonary edema. Electronically Signed   By: Suzy Bouchard M.D.   On: 09/02/2021 09:25   VAS US CAROTID  Result Date: 09/02/2021 Carotid Arterial Duplex Study Patient Name:  Katherine [name]  Date of Exam:   09/02/2021 Medical Rec #: [id number]           Accession #:    [id number] Date of Birth: 1956-07-30           Patient Gender: F Patient Age:   66 years Exam Location:  [facility] Procedure:      VAS US CAROTID Referring Phys: Jenkins Rouge --------------------------------------------------------------------------------  Risk Factors:      Hypertension, hyperlipidemia, Diabetes, current smoker,                    coronary artery disease. Limitations         Today's exam was limited due to the body habitus of the                    patient and patient position. Comparison Study:  No prior study Performing Technologist: Maudry Mayhew MHA, RDMS, RVT, RDCS  Examination Guidelines: A complete evaluation includes B-mode imaging, spectral Doppler, color Doppler, and power  Doppler as needed of all accessible portions of each vessel. Bilateral testing is considered an integral part of a complete examination. Limited examinations for reoccurring indications may be performed as noted.  Right Carotid Findings: +----------+--------+--------+--------+-------------------------+---------+             PSV cm/s EDV cm/s Stenosis Plaque Description        Comments   +----------+--------+--------+--------+-------------------------+---------+  CCA Prox   52       20                                                     +----------+--------+--------+--------+-------------------------+---------+  CCA Distal 55       12                calcific                  Shadowing  +----------+--------+--------+--------+-------------------------+---------+  ICA Prox   193      57       40-59%   heterogenous and calcific Shadowing  +----------+--------+--------+--------+-------------------------+---------+  ICA Distal 75       17                                                     +----------+--------+--------+--------+-------------------------+---------+  ECA        93       5                                                      +----------+--------+--------+--------+-------------------------+---------+ +----------+--------+-------+--------------+-------------------+             PSV cm/s EDV cms Describe       Arm Pressure (mmHG)  +----------+--------+-------+--------------+-------------------+  Subclavian                  Not identified                      +----------+--------+-------+--------------+-------------------+ +---------+--------+--------+--------------+  Vertebral PSV cm/s EDV  cm/s Not identified  +---------+--------+--------+--------------+  Left Carotid Findings: +----------+--------+--------+--------+------------------------------+---------+             PSV cm/s EDV cm/s Stenosis Plaque Description             Comments   +----------+--------+--------+--------+------------------------------+---------+  CCA Prox   95       19                                                          +----------+--------+--------+--------+------------------------------+---------+  CCA Distal 77       24                                                          +----------+--------+--------+--------+------------------------------+---------+  ICA Prox   151      41       40-59%   heterogenous, irregular and    Shadowing                                         calcific                                  +----------+--------+--------+--------+------------------------------+---------+  ICA Distal 137      38                                                          +----------+--------+--------+--------+------------------------------+---------+  ECA        140      34                heterogenous and calcific      shadowing  +----------+--------+--------+--------+------------------------------+---------+ +----------+--------+--------+--------------+-------------------+             PSV cm/s EDV cm/s Describe       Arm Pressure (mmHG)  +----------+--------+--------+--------------+-------------------+  Subclavian                   Not identified                      +----------+--------+--------+--------------+-------------------+ +---------+--------+--+--------+--+---------+  Vertebral PSV cm/s 56 EDV cm/s 20 Antegrade  +---------+--------+--+--------+--+---------+   Summary: Right Carotid: Velocities in the right ICA are consistent with a 40-59%                stenosis. Left Carotid: Velocities in the left ICA are consistent with a 40-59% stenosis. Vertebrals:  Left vertebral artery demonstrates antegrade flow. Right  vertebral              artery was not visualized. 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 *See table(s) above for measurements and observations.  Electronically signed by Deitra Mayo MD on 09/02/2021 at 3:24:09 PM.    Final     Cardiac Studies   N/a   Patient Profile     66 y.o. female patient of Dr. Allison Quarry admitted with [place] on 08/04/2021.  She has a history of remote CABG in 1995 with a LIMA to LAD, vein to diagonal branch and to the RCA cath June 2022 by Dr. Haroldine Laws via the right radial approach showed CTO of the native RCA and LAD with 60% native circumflex that was negative by FFR on 02/20/2021.  She had 2 layers of stent in her SVG to D2 and a patent vein graft to the RCA.  She does have a history of renal transplantation with preserved creatinine, AV fistula in left upper extremity and no access in lower extremities.  She was in 4 W/rehab and has had accelerated angina. She was also seen by Dr. 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  Assessment & Plan    CAD s/p CABG '95: Known history of complex coronary disease.  Recent attempt at cardiac catheterization on 1/5 but unable to obtain access.  Current angina.  Case was discussed by  Dr. Gwenlyn Found and Dr. Ellyn Hack with plans for cardiac catheterization via right radial access with Dr. Ellyn Hack Friday morning. --On Plavix, Imdur, metoprolol 25mg  BID, statin  Shared Decision Making/Informed Consent{  The risks [stroke (1 in 1000), death (1 in 1000), kidney failure [usually temporary] (1 in 500), bleeding (1 in 200), allergic reaction [possibly serious] (1 in 200)], benefits (diagnostic support and management of coronary artery disease) and alternatives of a cardiac catheterization were discussed in detail with Katherine [name] and she is willing to proceed.  Atrial flutter/fib: Eliquis on hold -- IV heparin with plans for  cath  PAD: does complain of claudication. Seen by VVS, currently not felt to be a candidate for aortobifemoral bypass grafting due to CAD and co-morbidities.   Hx of renal transplant: Cr stable at 0.75  HFrEF: Echo 07/2021 with LVEF of 45-50%. Given IV lasix 40mg  x1 this morning -- net - 1.4L, follow 1&Os  HLD: on statin   For questions or updates, please contact [place] Please consult [email or web address] for contact info under        Signed, Reino Bellis, NP  09/03/2021, 8:33 AM     Agree with note by Reino Bellis NP-C  Patient transferred from rehab to cardiac telemetry yesterday because of chest pain and shortness of breath.  Her Eliquis was discontinued and she was begun on IV heparin for atrial fibrillation.  She has had no recurrent symptoms.  Her enzymes are low and flat.  She is scheduled for diagnostic coronary angiography via the right radial approach by Dr. Ellyn Hack on Friday.  Lorretta Harp, M.D., [place], [facility], [place]verta [place] [address]. [place], [place]  [address]  [phone] 09/03/2021 10:36 AM

## 2021-09-03 NOTE — Evaluation (Signed)
Physical Therapy Evaluation Patient Details Name: Katherine [name] MRN: [id number] DOB: 1956/06/13 Today's Date: 09/03/2021  History of Present Illness  Pt is a 66 y/o F admitted 07/25/21-08/25/21 for COVID wtih MRSA, PNA, hospital course complicated by NSTEMI. Discharged to [facility] where she was progressing mobility however with increased activity experiencing increased R LE claudical pain as well as chest pain and increased HR/A-fib. Pt returned to hospital 09/02/21 for treatment of PAD R LE>LE and reattempt heart catheterization 09/05/21 PMH includes CAD s/p CABG and PCI, COPD at home on 3L O2 via [place], DM2, s/p renal transplant, anxiety, depression, anemia, fibromyalgia, and GERD.  Clinical Impression  Pt returns to hospital after working over a week at [facility] where she was walking 8 feet with RW. Pt currently fearful/emotional about having additional MI or onset of LE claudication pain. Pt report her cardiologist agreed that she does not have to participate in mobility until after heart catheterization on 1/20. PT educated pt on role of discharge planning. After increased encouragement able to talk through pt desire to return home with HHPT after hospitalization. Pt reports brother and god daughter will be able to provide assistance. PT discussed need for PTAR transfer back into home, at this time pt confident she will be able to climb 5 steps into her home despite not having done that since early December 2022. At end of session pt willing to demonstrate ROM WFL in bilateral LE  and ability to come to longsitting. PT will follow back after catheterization for more thorough assessment of mobility.       Recommendations for follow up therapy are one component of a multi-disciplinary discharge planning process, led by the attending physician.  Recommendations may be updated based on patient status, additional functional criteria and insurance authorization.  Follow Up Recommendations Home health PT (may require  PTAR home due to 5 steps to enter)    Assistance Recommended at Discharge Frequent or constant Supervision/Assistance  Patient can return home with the following  A lot of help with walking and/or transfers;A lot of help with bathing/dressing/bathroom;Assistance with cooking/housework;Assistance with feeding;Assist for transportation;Help with stairs or ramp for entrance    Equipment Recommendations None recommended by PT     Functional Status Assessment Patient has had a recent decline in their functional status and demonstrates the ability to make significant improvements in function in a reasonable and predictable amount of time.     Precautions / Restrictions Precautions Precautions: Fall Restrictions Weight Bearing Restrictions: No      Mobility  Bed Mobility Overal bed mobility: Needs Assistance Bed Mobility: Supine to Sit     Supine to sit: Supervision Sit to supine: Supervision   General bed mobility comments: able to scoot self laterally to move toward [place] transfer comment: deferred transfers until after catheterization on 1/20 as pt is very fearful of onset of claudication pain and chest pain                      Balance       Sitting balance - Comments: sits in longsitting with out trunk or UE support                                     Pertinent Vitals/Pain Pain Assessment Pain  Assessment: No/denies pain (however is very afraid of onset of pain in LE)    Home Living Family/patient expects to be discharged to:: Private residence Living Arrangements: Other relatives (moved in with brother in september) Available Help at Discharge: Family;Available 24 hours/day Type of Home: House Home Access: Stairs to enter Entrance Stairs-Rails: Left Entrance Stairs-Number of Steps: 5   Home Layout: One level Home Equipment: Rollator (4 wheels);Cane - single point;Tub bench;Grab bars -  toilet;Wheelchair - manual Additional Comments: Has BSC that she keeps in her bathtub, furniture walks around her house, wears 3L supplemental O2 at home    Prior Function Prior Level of Function : Independent/Modified Independent             Mobility Comments: reports not using AD for mobility, furniture walks ADLs Comments: enjoys singing and dancing     Hand Dominance   Dominant Hand: Right    Extremity/Trunk Assessment   Upper Extremity Assessment Upper Extremity Assessment: Overall WFL for tasks assessed    Lower Extremity Assessment Lower Extremity Assessment: Defer to PT evaluation RLE Deficits / Details: assessed in supine, ROM WFL, strength grossly 3+/5 LLE Deficits / Details: assessed in supine, knee flexion limited, otherwise ROM WFL, strength grossly 3+/5       Communication   Communication: No difficulties  Cognition Arousal/Alertness: Awake/alert Behavior During Therapy: Anxious, WFL for tasks assessed/performed Overall Cognitive Status: Within Functional Limits for tasks assessed                                 General Comments: empathized with pt and let pt allow limits of session due to increased anxiety noted with therapy and PT notes regarding pt being tearful/emotional with therapy sessions        General Comments General comments (skin integrity, edema, etc.): Educated pt on the importance of therapy at this time, and the risk of weakness from remaining in the bed. VSS throughout session        Assessment/Plan    PT Assessment Patient needs continued PT services  PT Problem List Decreased strength;Decreased activity tolerance;Decreased mobility;Cardiopulmonary status limiting activity;Pain       PT Treatment Interventions DME instruction;Gait training;Stair training;Functional mobility training;Therapeutic activities;Therapeutic exercise;Balance training;Cognitive remediation;Patient/family education    PT Goals (Current  goals can be found in the Care Plan section)  Acute Rehab PT Goals Patient Stated Goal: wants to go home PT Goal Formulation: With patient Time For Goal Achievement: 09/17/21 Potential to Achieve Goals: Good    Frequency Min 3X/week        AM-PAC PT "6 Clicks" Mobility  Outcome Measure Help needed turning from your back to your side while in a flat bed without using bedrails?: A Little Help needed moving from lying on your back to sitting on the side of a flat bed without using bedrails?: A Lot Help needed moving to and from a bed to a chair (including a wheelchair)?: A Lot Help needed standing up from a chair using your arms (e.g., wheelchair or bedside chair)?: A Lot Help needed to walk in hospital room?: A Lot Help needed climbing 3-5 steps with a railing? : A Lot 6 Click Score: 13    End of Session Equipment Utilized During Treatment: Gait belt;Oxygen Activity Tolerance: Other (comment) (limited by fear of onset of MI and/or claudication pain) Patient left: in bed;with bed alarm set Nurse Communication: Mobility status PT Visit Diagnosis: Muscle weakness (generalized) (M62.81);Difficulty  in walking, not elsewhere classified (R26.2)    Time: 7829-5621 PT Time Calculation (min) (ACUTE ONLY): 25 min   Charges:   PT Evaluation $PT Eval Moderate Complexity: 1 Mod PT Treatments $Therapeutic Activity: 8-22 mins        Torrance Stockley B. [name] PT, DPT Acute Rehabilitation Services Pager [phone] Office [phone]   [place] 09/03/2021, 6:01 PM

## 2021-09-03 NOTE — Progress Notes (Signed)
Recreation Therapy Discharge Note:  This patient was unable to complete the inpatient rehab program due to complex medical problems and declining health; therefore did not meet their long term goals.  If the patient is able to return to inpatient rehabilitation within 3 midnights, this may be considered an interrupted stay and therapy services will resume as ordered. Modification and reinstatement of their goals will be made upon completion of therapy service reevaluations.

## 2021-09-03 NOTE — Progress Notes (Signed)
° °  Palliative Medicine Inpatient Follow Up Note  Chart reviewed.  Per secure chat with Dr. Erlinda Hong, Katherine [name] is not interested at this time and speaking with the palliative care team.  Consult has been discontinued.  Palliative care will remain available should additional support be needed.  No charge ______________________________________________________________________________________ [place] Team Team Cell Phone: [phone] Please utilize secure chat with additional questions, if there is no response within 30 minutes please call the above phone number  Palliative Medicine Team providers are available by phone from 7am to 7pm daily and can be reached through the team cell phone.  Should this patient require assistance outside of these hours, please call the patient's attending physician.

## 2021-09-04 DIAGNOSIS — E119 Type 2 diabetes mellitus without complications: Secondary | ICD-10-CM

## 2021-09-04 DIAGNOSIS — Z794 Long term (current) use of insulin: Secondary | ICD-10-CM

## 2021-09-04 LAB — APTT
aPTT: 53 seconds — ABNORMAL HIGH (ref 24–36)
aPTT: 48 seconds — ABNORMAL HIGH (ref 24–36)

## 2021-09-04 LAB — BASIC METABOLIC PANEL
Anion gap: 9 (ref 5–15)
BUN: 19 mg/dL (ref 8–23)
CO2: 25 mmol/L (ref 22–32)
Calcium: 8.5 mg/dL — ABNORMAL LOW (ref 8.9–10.3)
Chloride: 97 mmol/L — ABNORMAL LOW (ref 98–111)
Creatinine, Ser: 0.74 mg/dL (ref 0.44–1.00)
GFR, Estimated: >60 mL/min (ref >60)
Glucose, Bld: 277 mg/dL — ABNORMAL HIGH (ref 70–99)
Potassium: 3.9 mmol/L (ref 3.5–5.1)
Sodium: 131 mmol/L — ABNORMAL LOW (ref 135–145)

## 2021-09-04 LAB — GLUCOSE, CAPILLARY
Glucose-Capillary: 208 mg/dL — ABNORMAL HIGH (ref 70–99)
Glucose-Capillary: 356 mg/dL — ABNORMAL HIGH (ref 70–99)
Glucose-Capillary: 252 mg/dL — ABNORMAL HIGH (ref 70–99)
Glucose-Capillary: 137 mg/dL — ABNORMAL HIGH (ref 70–99)

## 2021-09-04 LAB — CBC
HCT: 27.5 % — ABNORMAL LOW (ref 36.0–46.0)
Hemoglobin: 8.7 g/dL — ABNORMAL LOW (ref 12.0–15.0)
MCH: 31.6 pg (ref 26.0–34.0)
MCHC: 31.6 g/dL (ref 30.0–36.0)
MCV: 100 fL (ref 80.0–100.0)
Platelets: 225 10*3/uL (ref 150–400)
RBC: 2.75 MIL/uL — ABNORMAL LOW (ref 3.87–5.11)
RDW: 16.9 % — ABNORMAL HIGH (ref 11.5–15.5)
WBC: 6.1 10*3/uL (ref 4.0–10.5)
nRBC: 0.3 % — ABNORMAL HIGH (ref 0.0–0.2)

## 2021-09-04 LAB — PROCALCITONIN: Procalcitonin: <0.1 ng/mL

## 2021-09-04 LAB — MAGNESIUM: Magnesium: 1.9 mg/dL (ref 1.7–2.4)

## 2021-09-04 LAB — HEPARIN LEVEL (UNFRACTIONATED): Heparin Unfractionated: 0.47 IU/mL (ref 0.30–0.70)

## 2021-09-04 MED ORDER — SODIUM CHLORIDE 0.9 % IV SOLN: INTRAVENOUS | Status: DC

## 2021-09-04 MED ORDER — SODIUM CHLORIDE 0.9% FLUSH
3.0000 mL | Freq: Two times a day (BID) | INTRAVENOUS | Status: DC
  Administered 2021-09-06 – 2021-09-10 (×5): 3 mL via INTRAVENOUS

## 2021-09-04 MED ORDER — ASPIRIN 81 MG PO CHEW
81.0000 mg | CHEWABLE_TABLET | ORAL | Status: AC
  Administered 2021-09-05: 81 mg via ORAL
  Filled 2021-09-04: qty 1

## 2021-09-04 MED ORDER — FUROSEMIDE 10 MG/ML IJ SOLN
40.0000 mg | Freq: Once | INTRAMUSCULAR | Status: AC
  Administered 2021-09-04: 40 mg via INTRAVENOUS
  Filled 2021-09-04: qty 4

## 2021-09-04 MED ORDER — SODIUM CHLORIDE 0.9 % IV SOLN: 250.0000 mL | INTRAVENOUS | Status: DC | PRN

## 2021-09-04 MED ORDER — SODIUM CHLORIDE 0.9% FLUSH: 3.0000 mL | INTRAVENOUS | Status: DC | PRN

## 2021-09-04 NOTE — Progress Notes (Signed)
PROGRESS NOTE    Katherine [name]  QBH:[id number] DOB: 1956-08-03 DOA: 09/02/2021 PCP: [name], MD    No chief complaint on file.   Brief Narrative:  CAD s/p CABG and PCI, bilateral carotid artery stenosis, pvd, paf n eliquis, COPD home oxygen dependent on 3 L, IDDM2, s/p renal transplant on immunosuppressant,   urolithiasis, anemia, history of GI bleed,fibromyalgia/chronic pain , She had been admitted on 07/25/2021-08/25/2021, for COVID with MRSA pneumonia.  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  Since the cardiac cath was attempted patient reports that she has had right leg pain at rest.  She is seen by vascular surgery for possible intervention, patient understands she is high risk for any type of procedure but she elects to proceed, she is discharged from [facility] and admitted to hospitalist service Cards plan to do cardiac cath on friday  Subjective:  She is laying in bed,  she states Right leg pain is well controlled currently Reports had episode of chest tightness, cough last night, improved after increased oxygen supplement She is tolerating heparin drip  Assessment & Plan:   Active Problems:   Dyslipidemia, goal LDL below 70   CAD S/P percutaneous coronary angioplasty - PCI x 5 to SVG-D1   Debility   Chronic respiratory failure with hypoxia (HCC)   COPD (chronic obstructive pulmonary disease) (HCC)   History of renal transplant   Obesity (BMI 30-39.9)   Chronic diastolic CHF (congestive heart failure) (HCC)   Rest pain of lower extremity due to atherosclerosis (HCC)  Peripheral artery disease.  Right leg Currently on heparin drip Plavix and statin No plans for revascularization currently ,Management per vascular surgery  Chronic A. Fib Currently in A. fib, rate controlled on  beta-blocker apixaban held in anticipation for vascular intervention to right leg, currently on heparin drip Cardiology following, management per cardiology  CAD. S/p CABG Recent NSTEMI in last hospitalization, failed cardiac cath attempt due to lack of access  On Plavix, statin, imdur Currently on heparin drip due to holding apixaban Currently denies chest pain Seen by cardiology who plan to do diagnostic coronary angiography via the right radial approach by Dr. Ellyn Hack on Friday  Chronic combined CHF Last LVEF 45 to 50% in 07/2021 Prn IV Lasix  Currently does not appear overtly volume overloaded Continue monitor volume status Management per cardiology  COPD chronic hypoxic respiratory failure on home O2 stable  Insulin-dependent type 2 diabetes Hold metformin due to possible procedure Continue adjust insulin as needed Continue Tradjenta  Renal transplant on immunosuppressant  Back in 1991.  Patient is followed by Dr. Johnney Ou in the outpatient setting On Imuran and chronic prednisone Creatinine stable    Body mass index is 37.2 kg/m.[name] Kitchen    Unresulted Labs (From admission, onward)     Start     Ordered   09/05/21 0500  Heparin level (unfractionated)  Daily,   R     Question:  Specimen collection method  Answer:  Unit=Unit collect   09/04/21 0724   09/05/21 0973  Basic metabolic panel  Tomorrow morning,   R       Question:  Specimen collection method  Answer:  Unit=Unit collect   09/04/21 0724   09/04/21 0500  Procalcitonin  Daily,   R      09/03/21 1957   09/03/21 0500  CBC  Daily,   R     Question:  Specimen collection method  Answer:  Unit=Unit collect   09/02/21 1425              DVT prophylaxis:   Currently on heparin drip   Code Status: DNR Family Communication: Patient Disposition:   Status is: Inpatient  Dispo: The patient is from: Inpatient rehab              Anticipated d/c is to: TBD              Anticipated d/c date is: TBD                  Consultants:  Vascular surgery cardiology   Procedures:  Plan for cardiac cath   Antimicrobials:   Anti-infectives (From admission, onward)    None           Objective: Vitals:   09/04/21 0505 09/04/21 0509 09/04/21 0803 09/04/21 0842  BP:  123/63 107/64   Pulse:  88 81   Resp:  20 (!) 22   Temp:  99.4 F (37.4 C) 98.3 F (36.8 C)   TempSrc:  Oral Oral   SpO2:  92% 96% 96%  Weight: 89.3 kg     Height: 5\' 1"  (1.549 m)       Intake/Output Summary (Last 24 hours) at 09/04/2021 0909 Last data filed at 09/04/2021 0835 Gross per 24 hour  Intake 1195.06 ml  Output 1850 ml  Net -654.94 ml   Filed Weights   09/02/21 1524 09/03/21 0406 09/04/21 0505  Weight: 92.3 kg 92.1 kg 89.3 kg    Examination:  General exam: alert, awake, communicative,calm, NAD Respiratory system: mild intermittent crackles. Respiratory effort normal. Cardiovascular system:  RRR.  Gastrointestinal system: Abdomen is nondistended, soft and nontender.  Normal bowel sounds heard. Central nervous system: Alert and oriented. No focal neurological deficits. Extremities:  no edema Skin: No rashes, lesions or ulcers Psychiatry: Judgement and insight appear normal. Mood & affect appropriate.     Data Reviewed: I have personally reviewed following labs and imaging studies  CBC: Recent Labs  Lab 09/01/21 0345 09/02/21 0447 09/03/21 0419 09/04/21 0031  WBC 5.4 7.0 5.7 6.1  HGB 8.6* 9.2* 8.5* 8.7*  HCT 28.4* 29.7* 27.3* 27.5*  MCV 104.8* 103.8* 101.5* 100.0  PLT 137* 173 186 269    Basic Metabolic Panel: Recent Labs  Lab 09/01/21 0345 09/03/21 0419 09/04/21 0031  NA 135 132* 131*  K 4.0 3.8 3.9  CL 101 99 97*  CO2 28 25 25   GLUCOSE 284* 206* 277*  BUN 23 18 19   CREATININE 0.75 0.75 0.74  CALCIUM 8.8* 8.5* 8.5*  MG  --   --  1.9    GFR: Estimated Creatinine Clearance: 72.2 mL/min (by C-G formula based on SCr of 0.74 mg/dL).  Liver Function Tests: No results for input(s):  AST, ALT, ALKPHOS, BILITOT, PROT, ALBUMIN in the last 168 hours.  CBG: Recent Labs  Lab 09/03/21 0744 09/03/21 1150 09/03/21 1730 09/03/21 2138 09/04/21 0801  GLUCAP 169* 303* 157* 236* 208*     No results found for this or any previous visit (from the past 240 hour(s)).       Radiology Studies: DG CHEST PORT 1 VIEW  Result Date: 09/02/2021 CLINICAL DATA:  Short of breath EXAM: PORTABLE CHEST 1 VIEW COMPARISON:  None. FINDINGS: Sternotomy wires overlie normal cardiac silhouette. Patient rotated leftward. Diffuse patchy airspace opacities are similar comparison exam. No  pleural fluid. No pneumothorax. IMPRESSION: No change in diffuse patchy bilateral airspace disease consistent with multifocal pneumonia versus pulmonary edema. Electronically Signed   By: Suzy Bouchard M.D.   On: 09/02/2021 09:25   VAS US CAROTID  Result Date: 09/02/2021 Carotid Arterial Duplex Study Patient Name:  ALNITA AYBAR  Date of Exam:   09/02/2021 Medical Rec #: [id number]           Accession #:    [id number] Date of Birth: Jun 09, 1956           Patient Gender: F Patient Age:   38 years Exam Location:  [facility] Procedure:      VAS US CAROTID Referring Phys: Jenkins Rouge --------------------------------------------------------------------------------  Risk Factors:      Hypertension, hyperlipidemia, Diabetes, current smoker,                    coronary artery disease. Limitations        Today's exam was limited due to the body habitus of the                    patient and patient position. Comparison Study:  No prior study Performing Technologist: Maudry Mayhew MHA, RDMS, RVT, RDCS  Examination Guidelines: A complete evaluation includes B-mode imaging, spectral Doppler, color Doppler, and power Doppler as needed of all accessible portions of each vessel. Bilateral testing is considered an integral part of a complete examination. Limited examinations for reoccurring indications may be performed as  noted.  Right Carotid Findings: +----------+--------+--------+--------+-------------------------+---------+             PSV cm/s EDV cm/s Stenosis Plaque Description        Comments   +----------+--------+--------+--------+-------------------------+---------+  CCA Prox   52       20                                                     +----------+--------+--------+--------+-------------------------+---------+  CCA Distal 55       12                calcific                  Shadowing  +----------+--------+--------+--------+-------------------------+---------+  ICA Prox   193      57       40-59%   heterogenous and calcific Shadowing  +----------+--------+--------+--------+-------------------------+---------+  ICA Distal 75       17                                                     +----------+--------+--------+--------+-------------------------+---------+  ECA        93       5                                                      +----------+--------+--------+--------+-------------------------+---------+ +----------+--------+-------+--------------+-------------------+             PSV cm/s EDV cms Describe       Arm Pressure (mmHG)  +----------+--------+-------+--------------+-------------------+  Subclavian                  Not identified                      +----------+--------+-------+--------------+-------------------+ +---------+--------+--------+--------------+  Vertebral PSV cm/s EDV cm/s Not identified  +---------+--------+--------+--------------+  Left Carotid Findings: +----------+--------+--------+--------+------------------------------+---------+             PSV cm/s EDV cm/s Stenosis Plaque Description             Comments   +----------+--------+--------+--------+------------------------------+---------+  CCA Prox   95       19                                                          +----------+--------+--------+--------+------------------------------+---------+  CCA Distal 77       24                                                           +----------+--------+--------+--------+------------------------------+---------+  ICA Prox   151      41       40-59%   heterogenous, irregular and    Shadowing                                         calcific                                  +----------+--------+--------+--------+------------------------------+---------+  ICA Distal 137      38                                                          +----------+--------+--------+--------+------------------------------+---------+  ECA        140      34                heterogenous and calcific      shadowing  +----------+--------+--------+--------+------------------------------+---------+ +----------+--------+--------+--------------+-------------------+             PSV cm/s EDV cm/s Describe       Arm Pressure (mmHG)  +----------+--------+--------+--------------+-------------------+  Subclavian                   Not identified                      +----------+--------+--------+--------------+-------------------+ +---------+--------+--+--------+--+---------+  Vertebral PSV cm/s 56 EDV cm/s 20 Antegrade  +---------+--------+--+--------+--+---------+   Summary: Right Carotid: Velocities in the right ICA are consistent with a 40-59%                stenosis. Left Carotid: Velocities in the left ICA are consistent with a 40-59% stenosis. Vertebrals:  Left vertebral artery demonstrates antegrade flow. Right vertebral              artery  was not visualized. 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 *See table(s) above for measurements and observations.  Electronically signed by Deitra Mayo MD on 09/02/2021 at 3:24:09 PM.    Final         Scheduled Meds:  (feeding supplement) PROSource Plus  30 mL Oral BID BM   arformoterol  15 mcg Nebulization BID   vitamin C  500 mg Oral Daily   azaTHIOprine  125 mg Oral Daily   budesonide  2 mL Nebulization BID   calcitRIOL  0.25 mcg Oral Q3 days    Chlorhexidine Gluconate Cloth  6 each Topical Daily   clopidogrel  75 mg Oral Daily   diclofenac Sodium  2 g Topical TID   fluticasone  2 spray Each Nare Daily   furosemide  40 mg Intravenous Once   gabapentin  100 mg Oral QHS   guaiFENesin-dextromethorphan  10 mL Oral QID   hydrocerin   Topical BID   hydrocortisone  25 mg Rectal BID   insulin aspart  0-9 Units Subcutaneous TID WC   insulin aspart  5 Units Subcutaneous TID WC   insulin glargine-yfgn  20 Units Subcutaneous Daily   isosorbide dinitrate  10 mg Oral BID   lamoTRIgine  25 mg Oral QHS   linaclotide  145 mcg Oral QAC breakfast   linagliptin  5 mg Oral Daily   loratadine  10 mg Oral Daily   magic mouthwash  5 mL Oral QID   metoprolol tartrate  25 mg Oral TID   montelukast  10 mg Oral QHS   multivitamin with minerals  1 tablet Oral Daily   nutrition supplement (JUVEN)  1 packet Oral BID BM   nystatin   Topical TID   pantoprazole  40 mg Oral Daily   polyethylene glycol  17 g Oral TID   predniSONE  5 mg Oral Q breakfast   Ensure Max Protein  11 oz Oral TID BM   rosuvastatin  20 mg Oral Daily   senna-docusate  2 tablet Oral BID   sertraline  100 mg Oral Daily   sodium chloride flush  10-40 mL Intracatheter Q12H   terbinafine   Topical BID   zinc sulfate  220 mg Oral Daily   Continuous Infusions:  heparin 800 Units/hr (09/04/21 0413)     LOS: 2 days   Time spent: 23mins Greater than 50% of this time was spent in counseling, explanation of diagnosis, planning of further management, and coordination of care.   Voice Recognition Viviann Spare dictation system was used to create this note, attempts have been made to correct errors. Please contact the author with questions and/or clarifications.   Florencia Reasons, MD PhD FACP [facility]  Available via Epic secure chat 7am-7pm for nonurgent issues Please page for urgent issues To page the attending provider between 7A-7P or the covering provider during after hours 7P-7A,  please log into the web site [email or web address] and access using universal [place] password for that web site. If you do not have the password, please call the hospital operator.    09/04/2021, 9:09 AM

## 2021-09-04 NOTE — Progress Notes (Addendum)
Progress Note  Patient Name: Katherine [name] Date of Encounter: 09/04/2021  [facility] Cardiologist: [name], MD   Subjective   No chest pain, did have an episode of shortness of breath this morning but improved.   Inpatient Medications    Scheduled Meds:  (feeding supplement) PROSource Plus  30 mL Oral BID BM   arformoterol  15 mcg Nebulization BID   vitamin C  500 mg Oral Daily   azaTHIOprine  125 mg Oral Daily   budesonide  2 mL Nebulization BID   calcitRIOL  0.25 mcg Oral Q3 days   Chlorhexidine Gluconate Cloth  6 each Topical Daily   clopidogrel  75 mg Oral Daily   diclofenac Sodium  2 g Topical TID   fluticasone  2 spray Each Nare Daily   gabapentin  100 mg Oral QHS   guaiFENesin-dextromethorphan  10 mL Oral QID   hydrocerin   Topical BID   hydrocortisone  25 mg Rectal BID   insulin aspart  0-9 Units Subcutaneous TID WC   insulin aspart  5 Units Subcutaneous TID WC   insulin glargine-yfgn  20 Units Subcutaneous Daily   isosorbide dinitrate  10 mg Oral BID   lamoTRIgine  25 mg Oral QHS   linaclotide  145 mcg Oral QAC breakfast   linagliptin  5 mg Oral Daily   loratadine  10 mg Oral Daily   magic mouthwash  5 mL Oral QID   metoprolol tartrate  25 mg Oral TID   montelukast  10 mg Oral QHS   multivitamin with minerals  1 tablet Oral Daily   nutrition supplement (JUVEN)  1 packet Oral BID BM   nystatin   Topical TID   pantoprazole  40 mg Oral Daily   polyethylene glycol  17 g Oral TID   predniSONE  5 mg Oral Q breakfast   Ensure Max Protein  11 oz Oral TID BM   rosuvastatin  20 mg Oral Daily   senna-docusate  2 tablet Oral BID   sertraline  100 mg Oral Daily   sodium chloride flush  10-40 mL Intracatheter Q12H   terbinafine   Topical BID   zinc sulfate  220 mg Oral Daily   Continuous Infusions:  heparin 800 Units/hr (09/04/21 0413)   PRN Meds: acetaminophen, alum & mag hydroxide-simeth, bisacodyl, cyclobenzaprine, diphenhydrAMINE,  guaiFENesin-dextromethorphan, hydrALAZINE, ipratropium-albuterol, LORazepam, melatonin, menthol-cetylpyridinium, morphine injection, nitroGLYCERIN, oxyCODONE, polyethylene glycol, prochlorperazine **OR** prochlorperazine **OR** prochlorperazine, sodium chloride, sodium chloride flush, sodium phosphate   Vital Signs    Vitals:   09/03/21 2054 09/04/21 0505 09/04/21 0509 09/04/21 0803  BP: 117/62  123/63 107/64  Pulse: 87  88 81  Resp: 17  20 (!) 22  Temp: 98.9 F (37.2 C)  99.4 F (37.4 C) 98.3 F (36.8 C)  TempSrc: Oral  Oral Oral  SpO2: 93%  92% 96%  Weight:  89.3 kg    Height:  5\' 1"  (1.549 m)      Intake/Output Summary (Last 24 hours) at 09/04/2021 0835 Last data filed at 09/04/2021 0500 Gross per 24 hour  Intake 958.06 ml  Output 1850 ml  Net -891.94 ml   Last 3 Weights 09/04/2021 09/03/2021 09/02/2021  Weight (lbs) 196 lb 13.9 oz 203 lb 0.7 oz 203 lb 7.8 oz  Weight (kg) 89.3 kg 92.1 kg 92.3 kg      Telemetry    Atrial flutter, rate controlled - Personally Reviewed  ECG    No new tracing   Physical  Exam   GEN: No acute distress, wearing [place] @4L    Neck: No JVD Cardiac: Irreg, no murmurs, rubs, or gallops.  Respiratory: Diminished in bases GI: Soft, nontender, non-distended  MS: No edema; No deformity. Neuro:  Nonfocal  Psych: Normal affect   Labs    High Sensitivity Troponin:   Recent Labs  Lab 08/14/21 1845 08/19/21 1003 08/19/21 1135 09/01/21 1826 09/02/21 0447  TROPONINIHS 351* 93* 76* 52* 66*     Chemistry Recent Labs  Lab 09/01/21 0345 09/03/21 0419 09/04/21 0031  NA 135 132* 131*  K 4.0 3.8 3.9  CL 101 99 97*  CO2 28 25 25   GLUCOSE 284* 206* 277*  BUN 23 18 19   CREATININE 0.75 0.75 0.74  CALCIUM 8.8* 8.5* 8.5*  MG  --   --  1.9  GFRNONAA >60 >60 >60  ANIONGAP 6 8 9     Lipids No results for input(s): CHOL, TRIG, HDL, LABVLDL, LDLCALC, CHOLHDL in the last 168 hours.  Hematology Recent Labs  Lab 09/02/21 0447 09/03/21 0419  09/04/21 0031  WBC 7.0 5.7 6.1  RBC 2.86* 2.69* 2.75*  HGB 9.2* 8.5* 8.7*  HCT 29.7* 27.3* 27.5*  MCV 103.8* 101.5* 100.0  MCH 32.2 31.6 31.6  MCHC 31.0 31.1 31.6  RDW 16.5* 17.0* 16.9*  PLT 173 186 225   Thyroid No results for input(s): TSH, FREET4 in the last 168 hours.  BNP Recent Labs  Lab 09/02/21 0447  BNP 539.6*    DDimer No results for input(s): DDIMER in the last 168 hours.   Radiology    DG CHEST PORT 1 VIEW  Result Date: 09/02/2021 CLINICAL DATA:  Short of breath EXAM: PORTABLE CHEST 1 VIEW COMPARISON:  None. FINDINGS: Sternotomy wires overlie normal cardiac silhouette. Patient rotated leftward. Diffuse patchy airspace opacities are similar comparison exam. No pleural fluid. No pneumothorax. IMPRESSION: No change in diffuse patchy bilateral airspace disease consistent with multifocal pneumonia versus pulmonary edema. Electronically Signed   By: Suzy Bouchard M.D.   On: 09/02/2021 09:25   VAS US CAROTID  Result Date: 09/02/2021 Carotid Arterial Duplex Study Patient Name:  SHRONDA BOEH  Date of Exam:   09/02/2021 Medical Rec #: [id number]           Accession #:    [id number] Date of Birth: December 11, 1955           Patient Gender: F Patient Age:   66 years Exam Location:  [facility] Procedure:      VAS US CAROTID Referring Phys: Jenkins Rouge --------------------------------------------------------------------------------  Risk Factors:      Hypertension, hyperlipidemia, Diabetes, current smoker,                    coronary artery disease. Limitations        Today's exam was limited due to the body habitus of the                    patient and patient position. Comparison Study:  No prior study Performing Technologist: Maudry Mayhew MHA, RDMS, RVT, RDCS  Examination Guidelines: A complete evaluation includes B-mode imaging, spectral Doppler, color Doppler, and power Doppler as needed of all accessible portions of each vessel. Bilateral testing is considered an  integral part of a complete examination. Limited examinations for reoccurring indications may be performed as noted.  Right Carotid Findings: +----------+--------+--------+--------+-------------------------+---------+             PSV cm/s EDV cm/s Stenosis Plaque Description  Comments   +----------+--------+--------+--------+-------------------------+---------+  CCA Prox   52       20                                                     +----------+--------+--------+--------+-------------------------+---------+  CCA Distal 55       12                calcific                  Shadowing  +----------+--------+--------+--------+-------------------------+---------+  ICA Prox   193      57       40-59%   heterogenous and calcific Shadowing  +----------+--------+--------+--------+-------------------------+---------+  ICA Distal 75       17                                                     +----------+--------+--------+--------+-------------------------+---------+  ECA        93       5                                                      +----------+--------+--------+--------+-------------------------+---------+ +----------+--------+-------+--------------+-------------------+             PSV cm/s EDV cms Describe       Arm Pressure (mmHG)  +----------+--------+-------+--------------+-------------------+  Subclavian                  Not identified                      +----------+--------+-------+--------------+-------------------+ +---------+--------+--------+--------------+  Vertebral PSV cm/s EDV cm/s Not identified  +---------+--------+--------+--------------+  Left Carotid Findings: +----------+--------+--------+--------+------------------------------+---------+             PSV cm/s EDV cm/s Stenosis Plaque Description             Comments   +----------+--------+--------+--------+------------------------------+---------+  CCA Prox   95       19                                                           +----------+--------+--------+--------+------------------------------+---------+  CCA Distal 77       24                                                          +----------+--------+--------+--------+------------------------------+---------+  ICA Prox   151      41       40-59%   heterogenous, irregular and    Shadowing  calcific                                  +----------+--------+--------+--------+------------------------------+---------+  ICA Distal 137      38                                                          +----------+--------+--------+--------+------------------------------+---------+  ECA        140      34                heterogenous and calcific      shadowing  +----------+--------+--------+--------+------------------------------+---------+ +----------+--------+--------+--------------+-------------------+             PSV cm/s EDV cm/s Describe       Arm Pressure (mmHG)  +----------+--------+--------+--------------+-------------------+  Subclavian                   Not identified                      +----------+--------+--------+--------------+-------------------+ +---------+--------+--+--------+--+---------+  Vertebral PSV cm/s 56 EDV cm/s 20 Antegrade  +---------+--------+--+--------+--+---------+   Summary: Right Carotid: Velocities in the right ICA are consistent with a 40-59%                stenosis. Left Carotid: Velocities in the left ICA are consistent with a 40-59% stenosis. Vertebrals:  Left vertebral artery demonstrates antegrade flow. Right vertebral              artery was not visualized. 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 *See table(s) above for measurements and observations.  Electronically signed by Deitra Mayo MD on 09/02/2021 at 3:24:09 PM.    Final     Cardiac Studies   N/a   Patient Profile     66 y.o. female  patient of Dr. Allison Quarry admitted with [facility] on 08/04/2021.  She has a history of  remote CABG in 1995 with a LIMA to LAD, vein to diagonal branch and to the RCA cath June 2022 by Dr. Haroldine Laws via the right radial approach showed CTO of the native RCA and LAD with 60% native circumflex that was negative by FFR on 02/20/2021.  She had 2 layers of stent in her SVG to D2 and a patent vein graft to the RCA.  She does have a history of renal transplantation with preserved creatinine, AV fistula in left upper extremity and no access in lower extremities.  She was in 4 W/rehab and has had accelerated angina. She was also seen by Dr. 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  Assessment & Plan    CAD s/p CABG '95: Known history of complex coronary disease.  Recent attempt at cardiac catheterization on 1/5 but unable to obtain access.  Current angina.  Case was discussed by Dr. Gwenlyn Found and Dr. Ellyn Hack with plans for cardiac catheterization via right radial access with Dr. Ellyn Hack Friday morning @9am . --On Plavix, Imdur, metoprolol 25mg  BID, statin, IV heparin    Shared Decision Making/Informed Consent{   The risks [stroke (1 in 1000), death (1 in 1000), kidney failure [usually temporary] (1 in 500), bleeding (1 in 200), allergic reaction [possibly serious] (1 in 200)],  benefits (diagnostic support and management of coronary artery disease) and alternatives of a cardiac catheterization were discussed in detail with Ms. Ratterree and she is willing to proceed.   Atrial flutter/fib: Eliquis on hold, rate controlled  -- IV heparin with plans for cath -- on metoprolol 25mg  BID   PAD: does complain of claudication. Seen by VVS, currently not felt to be a candidate for aortobifemoral bypass grafting due to CAD and co-morbidities.    Hx of renal transplant: follows with Dr. Johnney Ou  -- on Imuran and chronic prednisone -- Cr remains stable    HFrEF: Echo 07/2021 with LVEF of 45-50%. Had IV lasix 40mg  x1 yesterday,  2.9 UOP yesterday. Had some shortness of breath this morning and O2 increased to 4L -- will give IV lasix 40mg  x1 -- follow response    HLD: on statin   COPD/chronic hypoxic respiratory failure: on home O2   For questions or updates, please contact [facility] Please consult [email or web address] for contact info under        Signed, Reino Bellis, NP  09/04/2021, 8:35 AM     Agree with note by Reino Bellis NP-C  Feeling much better after diuresis.  No further chest pain.  On IV heparin.  Scheduled for coronary angiography tomorrow by Dr. Ellyn Hack via the right radial approach.  Lorretta Harp, M.D., [place], [facility], [employer] [address] [place], [place]  [address]  [phone] 09/04/2021 10:59 AM

## 2021-09-04 NOTE — Progress Notes (Signed)
Pt scratched her neck in her sleep causing the site to bleed. Pressure dressing applied and reinforced three times with manual pressure held. Will continue to monitor.   Elaina Hoops, RN

## 2021-09-04 NOTE — Progress Notes (Signed)
°  Progress Note    09/04/2021 9:58 AM * No surgery found *  Subjective:   denies any foot pain, also not having chest pain at this time but does still have some shortness of breath  Vitals:   09/04/21 0803 09/04/21 0842  BP: 107/64   Pulse: 81   Resp: (!) 22   Temp: 98.3 F (36.8 C)   SpO2: 96% 96%    Physical Exam: Awake alert oriented Nonlabored respirations on exam Feet are not painful to palpation this morning  CBC    Component Value Date/Time   WBC 6.1 09/04/2021 0031   RBC 2.75 (L) 09/04/2021 0031   HGB 8.7 (L) 09/04/2021 0031   HCT 27.5 (L) 09/04/2021 0031   PLT 225 09/04/2021 0031   MCV 100.0 09/04/2021 0031   MCH 31.6 09/04/2021 0031   MCHC 31.6 09/04/2021 0031   RDW 16.9 (H) 09/04/2021 0031   LYMPHSABS 1.2 08/26/2021 0311   MONOABS 0.8 08/26/2021 0311   EOSABS 0.1 08/26/2021 0311   BASOSABS 0.0 08/26/2021 0311    BMET    Component Value Date/Time   NA 131 (L) 09/04/2021 0031   NA 134 (A) 01/04/2018 0000   K 3.9 09/04/2021 0031   CL 97 (L) 09/04/2021 0031   CO2 25 09/04/2021 0031   GLUCOSE 277 (H) 09/04/2021 0031   BUN 19 09/04/2021 0031   BUN 17 01/04/2018 0000   CREATININE 0.74 09/04/2021 0031   CREATININE 0.90 06/26/2014 1108   CALCIUM 8.5 (L) 09/04/2021 0031   GFRNONAA >60 09/04/2021 0031   GFRAA 58 (L) 04/12/2020 1400    INR    Component Value Date/Time   INR 1.2 01/31/2021 0143     Intake/Output Summary (Last 24 hours) at 09/04/2021 0958 Last data filed at 09/04/2021 0835 Gross per 24 hour  Intake 1195.06 ml  Output 1850 ml  Net -654.94 ml     Assessment/plan:  66 y.o. female has been transferred back from rehab with chest pain and shortness of breath with plan for cardiac catheterization on Friday.  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  We have discussed her options being continued  pain control versus bilateral common femoral endarterectomies with possible retrograde iliac stenting and possible axillary to bifemoral bypass.  Per cardiology she would be high risk for any open surgery and I am in agreement with this.  As she currently has no pain will not plan for any procedures.  She is set up for cardiac catheterization tomorrow.  Vascular surgery will be available as needed at this point.    Feiga Nadel C. Donzetta Matters, MD [facility] Office: [phone] Pager: [phone]  09/04/2021 9:58 AM

## 2021-09-04 NOTE — Progress Notes (Signed)
Occupational Therapy Treatment Patient Details Name: Katherine [name] MRN: [id number] DOB: [birth year] Today's Date: 09/04/2021   History of present illness Pt is a 66 y/o F admitted 07/25/21-08/25/21 for COVID wtih MRSA, PNA, hospital course complicated by NSTEMI. Discharged to AIR where she was progressing mobility however with increased activity experiencing increased R LE claudical pain as well as chest pain and increased HR/A-fib. Pt returned to hospital 09/02/21 for treatment of PAD R LE>LE and reattempt heart catheterization 09/05/21 PMH includes CAD s/p CABG and PCI, COPD at home on 3L O2 via [place], DM2, s/p renal transplant, anxiety, depression, anemia, fibromyalgia, and GERD.   OT comments  Pt premedicated with anxiety meds however would not mobilize OOB to chair until after her cath tomorrow. Session focused on bed level exercise, ADL at bed level and education on correct use of incentive spirometer and importance of use of both flutter valve and spirometer. Pt said her goal was to DC home with support of family with [facility] services, which is most likely achievable. Discussed importance of mobility after cath and pt verbalized understanding. Will follow up after cath and follow acutely to facilitate safe DC home. On 2L with VSS   Recommendations for follow up therapy are one component of a multi-disciplinary discharge planning process, led by the attending physician.  Recommendations may be updated based on patient status, additional functional criteria and insurance authorization.    Follow Up Recommendations  Home health OT    Assistance Recommended at Discharge Frequent or constant Supervision/Assistance  Patient can return home with the following  A little help with walking and/or transfers;A little help with bathing/dressing/bathroom;Assist for transportation;Help with stairs or ramp for entrance;Assistance with cooking/housework   Equipment Recommendations  None recommended by OT     Recommendations for Other Services      Precautions / Restrictions Precautions Precautions: Fall Precaution Comments: monitor sats/chest pain/anxiety       Mobility Bed Mobility Overal bed mobility: Needs Assistance   Rolling: Modified independent (Device/Increase time)         General bed mobility comments: declined to move EOB    Transfers                   General transfer comment: pt declined     Balance                                           ADL either performed or assessed with clinical judgement   ADL       Grooming: Set up;Bed level   Upper Body Bathing: Set up;Bed level   Lower Body Bathing: Set up;Supervison/ safety;Bed level   Upper Body Dressing : Set up;Bed level   Lower Body Dressing: Set up;Supervision/safety;Bed level               Functional mobility during ADLs:  (pt declined) General ADL Comments: per report, pt fatigues with activity; educated on pursed lip breathing    Extremity/Trunk Assessment Upper Extremity Assessment Upper Extremity Assessment: Generalized weakness   Lower Extremity Assessment Lower Extremity Assessment: Defer to PT evaluation LLE Deficits / Details:  (weaker than RLE but functional)        Vision       Perception     Praxis      Cognition Arousal/Alertness: Awake/alert Behavior During Therapy: Anxious Overall Cognitive Status: Within Functional Limits for tasks  assessed                                          Exercises Exercises: Other exercises, General Upper Extremity, General Lower Extremity General Exercises - Upper Extremity Shoulder Flexion: Both, 10 reps, AROM Shoulder ABduction: AROM, Both, 10 reps Elbow Flexion: AROM, Both, 10 reps Elbow Extension: AROM, Both, 10 reps General Exercises - Lower Extremity Ankle Circles/Pumps: AROM, Both, 10 reps Heel Slides: AROM, Both, 20 reps Straight Leg Raises: Both, 20 reps, Supine Other  Exercises Other Exercises: flutter valve x 5 - productive cough Other Exercises: incentive spirometer x 5 - able to pull 1061ml Pursed lip breathing technique   Shoulder Instructions       General Comments      Pertinent Vitals/ Pain       Pain Assessment Pain Assessment: Faces Faces Pain Scale: Hurts a little bit Pain Location: R groni/LE Pain Descriptors / Indicators: Discomfort Pain Intervention(s): Limited activity within patient's tolerance  Home Living                                          Prior Functioning/Environment              Frequency  Min 2X/week        Progress Toward Goals  OT Goals(current goals can now be found in the care plan section)  Progress towards OT goals: Goals met and updated - see care plan  Acute Rehab OT Goals Patient Stated Goal: to have her cath and go home with home health and not go back to CIR OT Goal Formulation: With patient Time For Goal Achievement: 09/17/21 Potential to Achieve Goals: Good ADL Goals Pt Will Perform Lower Body Bathing: with adaptive equipment;with modified independence Pt Will Perform Lower Body Dressing: with modified independence;sit to/from stand Pt Will Transfer to Toilet: with modified independence;ambulating Pt Will Perform Toileting - Clothing Manipulation and hygiene: with modified independence Pt/caregiver will Perform Home Exercise Program: Increased strength;Both right and left upper extremity;With theraband;Independently Additional ADL Goal #1: Independently demosntrate 3 energy conservation strategies during ADL session Additional ADL Goal #2: independently verbalize 3 strategies to reduce risk of falls  Plan Other (comment);Discharge plan remains appropriate (will further assess after cardiac cath; pt would like to DC home)    Co-evaluation                 AM-PAC OT "6 Clicks" Daily Activity     Outcome Measure   Help from another person eating meals?:  None Help from another person taking care of personal grooming?: A Little Help from another person toileting, which includes using toliet, bedpan, or urinal?: A Little Help from another person bathing (including washing, rinsing, drying)?: A Little Help from another person to put on and taking off regular upper body clothing?: A Little Help from another person to put on and taking off regular lower body clothing?: A Little 6 Click Score: 19    End of Session Equipment Utilized During Treatment: Oxygen (2L)  OT Visit Diagnosis: Other abnormalities of gait and mobility (R26.89);Muscle weakness (generalized) (M62.81);Pain Pain - Right/Left: Right Pain - part of body: Leg   Activity Tolerance Other (comment) (self limited session)   Patient Left in bed;with call bell/phone within reach;with bed alarm set  Nurse Communication Mobility status;Other (comment) (participation)        Time: 2440-1027 OT Time Calculation (min): 25 min  Charges: OT General Charges $OT Visit: 1 Visit OT Treatments $Self Care/Home Management : 8-22 mins $Therapeutic Exercise: 8-22 mins  Maurie Boettcher, OT/L   Acute OT Clinical Specialist Acute Rehabilitation Services Pager [phone] Office [phone]   [facility] 09/04/2021, 10:16 AM

## 2021-09-04 NOTE — Progress Notes (Signed)
ANTICOAGULATION CONSULT NOTE Pharmacy Consult for heparin Indication: chest pain/ACS and atrial fibrillation  Allergies  Allergen Reactions   Tetracycline Hives    Patient tolerated Doxycycline Dec 2020   Niacin Other (See Comments)    Mouth blisters   Niaspan [Niacin Er] Other (See Comments)    Mouth blisters   Sulfa Antibiotics Nausea Only and Other (See Comments)    "Tears up stomach"   Sulfonamide Derivatives Other (See Comments)    Reaction: per patient "tears her stomach up"   Codeine Nausea And Vomiting   Erythromycin Nausea And Vomiting   Hydromorphone Hcl Nausea And Vomiting   Morphine And Related Nausea And Vomiting   Nalbuphine Nausea And Vomiting    Nubain   Sulfasalazine Nausea Only and Other (See Comments)    per patient "tears her stomach up", "Tears up stomach"   Tape Rash and Other (See Comments)    No "plastic" tape," please----cloth tape only    Patient Measurements: Height: 5\' 1"  (154.9 cm) Weight: 92.1 kg (203 lb 0.7 oz) IBW/kg (Calculated) : 47.8 Heparin Dosing Weight: 66kg  Vital Signs: Temp: 98.9 F (37.2 C) (01/18 2054) Temp Source: Oral (01/18 2054) BP: 117/62 (01/18 2054) Pulse Rate: 87 (01/18 2054)  Labs: Recent Labs    09/01/21 0345 09/01/21 1826 09/02/21 0447 09/02/21 0447 09/03/21 0419 09/03/21 1144 09/03/21 1723 09/04/21 0031  HGB 8.6*  --  9.2*  --  8.5*  --   --  8.7*  HCT 28.4*  --  29.7*  --  27.3*  --   --  27.5*  PLT 137*  --  173  --  186  --   --  225  APTT  --   --   --    < > 46* >200* 38* 53*  HEPARINUNFRC  --   --   --   --  >1.10*  --   --   --   CREATININE 0.75  --   --   --  0.75  --   --  0.74  TROPONINIHS  --  52* 66*  --   --   --   --   --    < > = values in this interval not displayed.     Estimated Creatinine Clearance: 73.5 mL/min (by C-G formula based on SCr of 0.74 mg/dL).   Medical History: Past Medical History:  Diagnosis Date   Anemia    Anxiety    Bilateral carotid artery stenosis     Carotid duplex 0/2585: 2-77% LICA, 82-42% RICA, >35% RECA, f/u 1 yr suggested   CAD (coronary artery disease) of bypass graft 5/01; 3/'02, 8/'03, 10/'04; 1/15   PCI x 5 to SVG-D1    CAD in native artery 07/1993   3 Vessel Disease (LAD-D1 & RCA) -- CABG (Dx in setting of inferior STEMI-PTCA of RCA)   CAD S/P percutaneous coronary angioplasty    PCI to SVG-D1 insertion/native D1 x 4 = '01 -(S660 BMS 2.5 x 9 anastomosis- D1); '02 - distal overlap ACS Pixel 2.5 x 8  BMS; '03 distal/native ISR/Thrombosis - Pixel 2.5 x 13; '04 - ISR-  Taxus 2.5 x 20 (covered all);; 1/15 - mid SVG-D1 (50% distal ISR) - Promus P 2.75 x 20 -- 2.8 mm; 6/22: Extensive ISR PTCA & Anastomotic-Native Diag DES PCI (Frontier Onyx 2.25x12 (2.75-2.5 mm post-dilation   COPD mixed type ([place])    Followed by Dr. Lamonte Sakai "pulmonologist said no COPD"   Depression with anxiety  Diabetes mellitus type 2 in obese ([place])    Diarrhea    started after cholecystectomy and mass removed from intestine   Dyslipidemia, goal LDL below 70    08/2012: TC 137, TG 200, HDL 32!, LDL 45; on statin (followed by Dr.Deterding)   ESRD (end stage renal disease) ([place]) 1991   s/p Cadaveric Renal Transplant [facility] - Dr. Jimmy Footman)    Family history of adverse reaction to anesthesia    mom's bp dropped during/after anesthesia   Fibromyalgia    GERD (gastroesophageal reflux disease)    Glomerulonephritis, chronic, rapidly progressive 1989   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 07/1993   Rescue PTCA of RCA -- referred for CABG.   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  Had having having lots of nausea and vomiting with poor p.o. intake.  Thought to have QT  prolongation with multiple medications involved and hypomagnesemia, hypokalemia.  Tikosyn discontinued along with Zoloft and Phenergan.   Hypertension associated with diabetes ([place])    Mild aortic stenosis by prior echocardiogram 07/2019   Echo:  Mild aortic stenosis (gradients: Mean 14.3 mmHg -peak 24.9 mmHg).   Morbid obesity (HCC)    MRSA (methicillin resistant staph aureus) culture positive    OSA (obstructive sleep apnea)    no longer on CPAP or home O2, states she doesn't need now after lap band   PAD (peripheral artery disease) ([place]) 08/2013   LEA Dopplers to be read by Dr. Fletcher Anon   PAF (paroxysmal atrial fibrillation) ([place]) 06/2014   Noted on CardioNet Monitor  - --> rhythm control with Tikosyn (Dr. Rayann Heman); converted from warfarin to apixaban for anticoagulation.   Pneumonia    Recurrent boils    Bilateral Groin   Rheumatoid arthritis ([place])    Per Patient Report; associated with OA   S/p cadaver renal transplant 1991   [facility]   Torsades de pointes 05/12/2021   Unstable angina ([place]) 5/01; 3/'02, 8/'03, 10/'04; 1/15   x 5 occurences since Inf-Post STEMI in 1994   Assessment: 66 year old female previously on inpatient rehab now admitted to progressive unit d/t multiple medical issues. Cardiology consulted yesterday due to new chest pain and afib with rvr. Patient has been on apixaban since 1/6. New orders to transition back to IV heparin with plans for cath on Friday.   APTT subtherapeutic this AM at 53 on 700 units/hr  Current hemoglobin stable at 8.7, plt improved. No bleeding issues have been noted.   Goal of Therapy:  Heparin level 0.3-0.7 units/ml aPTT 66-102 seconds Monitor platelets by anticoagulation protocol: Yes   Plan:  Increase heparin to 800 units/hr and recheck aptt in 6 hours  Monitor for s/s of bleeding  Albertina Parr, PharmD., BCPS, BCCCP Clinical Pharmacist Please refer to [facility] for unit-specific pharmacist

## 2021-09-04 NOTE — Progress Notes (Signed)
ANTICOAGULATION CONSULT NOTE Pharmacy Consult for heparin Indication: chest pain/ACS and atrial fibrillation  Allergies  Allergen Reactions   Tetracycline Hives    Patient tolerated Doxycycline Dec 2020   Niacin Other (See Comments)    Mouth blisters   Niaspan [Niacin Er] Other (See Comments)    Mouth blisters   Sulfa Antibiotics Nausea Only and Other (See Comments)    "Tears up stomach"   Sulfonamide Derivatives Other (See Comments)    Reaction: per patient "tears her stomach up"   Codeine Nausea And Vomiting   Erythromycin Nausea And Vomiting   Hydromorphone Hcl Nausea And Vomiting   Morphine And Related Nausea And Vomiting   Nalbuphine Nausea And Vomiting    Nubain   Sulfasalazine Nausea Only and Other (See Comments)    per patient "tears her stomach up", "Tears up stomach"   Tape Rash and Other (See Comments)    No "plastic" tape," please----cloth tape only    Patient Measurements: Height: 5\' 1"  (154.9 cm) Weight: 89.3 kg (196 lb 13.9 oz) IBW/kg (Calculated) : 47.8 Heparin Dosing Weight: 66kg  Vital Signs: Temp: 99.4 F (37.4 C) (01/19 0509) Temp Source: Oral (01/19 0509) BP: 123/63 (01/19 0509) Pulse Rate: 88 (01/19 0509)  Labs: Recent Labs    09/01/21 1826 09/02/21 0447 09/02/21 0447 09/03/21 0419 09/03/21 1144 09/03/21 1723 09/04/21 0031  HGB  --  9.2*   < > 8.5*  --   --  8.7*  HCT  --  29.7*  --  27.3*  --   --  27.5*  PLT  --  173  --  186  --   --  225  APTT  --   --    < > 46* >200* 38* 53*  HEPARINUNFRC  --   --   --  >1.10*  --   --   --   CREATININE  --   --   --  0.75  --   --  0.74  TROPONINIHS 52* 66*  --   --   --   --   --    < > = values in this interval not displayed.     Estimated Creatinine Clearance: 72.2 mL/min (by C-G formula based on SCr of 0.74 mg/dL).   Medical History: Past Medical History:  Diagnosis Date   Anemia    Anxiety    Bilateral carotid artery stenosis    Carotid duplex 09/9526: 4-13% LICA, 24-40% RICA,  >10% RECA, f/u 1 yr suggested   CAD (coronary artery disease) of bypass graft 5/01; 3/'02, 8/'03, 10/'04; 1/15   PCI x 5 to SVG-D1    CAD in native artery 07/1993   3 Vessel Disease (LAD-D1 & RCA) -- CABG (Dx in setting of inferior STEMI-PTCA of RCA)   CAD S/P percutaneous coronary angioplasty    PCI to SVG-D1 insertion/native D1 x 4 = '01 -(S660 BMS 2.5 x 9 anastomosis- D1); '02 - distal overlap ACS Pixel 2.5 x 8  BMS; '03 distal/native ISR/Thrombosis - Pixel 2.5 x 13; '04 - ISR-  Taxus 2.5 x 20 (covered all);; 1/15 - mid SVG-D1 (50% distal ISR) - Promus P 2.75 x 20 -- 2.8 mm; 6/22: Extensive ISR PTCA & Anastomotic-Native Diag DES PCI (Frontier Onyx 2.25x12 (2.75-2.5 mm post-dilation   COPD mixed type ([place])    Followed by Dr. Lamonte Sakai "pulmonologist said no COPD"   Depression with anxiety    Diabetes mellitus type 2 in obese ([place])    Diarrhea  started after cholecystectomy and mass removed from intestine   Dyslipidemia, goal LDL below 70    08/2012: TC 137, TG 200, HDL 32!, LDL 45; on statin (followed by Dr.Deterding)   ESRD (end stage renal disease) ([place]) 1991   s/p Cadaveric Renal Transplant [facility] - Dr. Jimmy Footman)    Family history of adverse reaction to anesthesia    mom's bp dropped during/after anesthesia   Fibromyalgia    GERD (gastroesophageal reflux disease)    Glomerulonephritis, chronic, rapidly progressive 1989   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 07/1993   Rescue PTCA of RCA -- referred for CABG.   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  Had having having lots of nausea and vomiting with poor p.o. intake.  Thought to have QT prolongation with multiple medications involved  and hypomagnesemia, hypokalemia.  Tikosyn discontinued along with Zoloft and Phenergan.   Hypertension associated with diabetes ([place])    Mild aortic stenosis by prior echocardiogram 07/2019   Echo:  Mild aortic stenosis (gradients: Mean 14.3 mmHg -peak 24.9 mmHg).   Morbid obesity (HCC)    MRSA (methicillin resistant staph aureus) culture positive    OSA (obstructive sleep apnea)    no longer on CPAP or home O2, states she doesn't need now after lap band   PAD (peripheral artery disease) ([place]) 08/2013   LEA Dopplers to be read by Dr. Fletcher Anon   PAF (paroxysmal atrial fibrillation) ([place]) 06/2014   Noted on CardioNet Monitor  - --> rhythm control with Tikosyn (Dr. Rayann Heman); converted from warfarin to apixaban for anticoagulation.   Pneumonia    Recurrent boils    Bilateral Groin   Rheumatoid arthritis ([place])    Per Patient Report; associated with OA   S/p cadaver renal transplant 1991   [facility]   Torsades de pointes 05/12/2021   Unstable angina ([place]) 5/01; 3/'02, 8/'03, 10/'04; 1/15   x 5 occurences since Inf-Post STEMI in 1994   Assessment: 66 year old female previously on inpatient rehab now admitted to progressive unit d/t multiple medical issues. Cardiology consulted yesterday due to new chest pain and afib with rvr. Patient has been on apixaban since 1/6. New orders to transition back to IV heparin with plans for cath on Friday.   APTT subtherapeutic this AM at 48s on 800 units/hr  Current hemoglobin stable at 8.7, plt improved. Patient scratched self overnight, that bleeding has resolved. No overt bleeding noted. Heparin level trending down but still not correlated.   Goal of Therapy:  Heparin level 0.3-0.7 units/ml aPTT 66-102 seconds Monitor platelets by anticoagulation protocol: Yes   Plan:  Increase heparin to 950 units/hr and recheck aptt in am Monitor for s/s of bleeding Planning cath in am  Erin Hearing PharmD., BCPS Clinical Pharmacist 09/04/2021 7:25 AM

## 2021-09-05 ENCOUNTER — Encounter (HOSPITAL_COMMUNITY): Payer: Self-pay | Admitting: Internal Medicine

## 2021-09-05 ENCOUNTER — Other Ambulatory Visit: Payer: Self-pay

## 2021-09-05 ENCOUNTER — Encounter (HOSPITAL_COMMUNITY)
Admission: AD | Disposition: A | Discharge status: Home / Self Care | Payer: Medicare Other | Source: Ambulatory Visit | Attending: Internal Medicine

## 2021-09-05 DIAGNOSIS — I779 Disorder of arteries and arterioles, unspecified: Secondary | ICD-10-CM

## 2021-09-05 DIAGNOSIS — I25110 Atherosclerotic heart disease of native coronary artery with unstable angina pectoris: Secondary | ICD-10-CM

## 2021-09-05 DIAGNOSIS — I5032 Chronic diastolic (congestive) heart failure: Secondary | ICD-10-CM

## 2021-09-05 HISTORY — PX: LEFT HEART CATH AND CORS/GRAFTS ANGIOGRAPHY: CATH118250

## 2021-09-05 LAB — BASIC METABOLIC PANEL
Anion gap: 11 (ref 5–15)
BUN: 21 mg/dL (ref 8–23)
CO2: 25 mmol/L (ref 22–32)
Calcium: 8.7 mg/dL — ABNORMAL LOW (ref 8.9–10.3)
Chloride: 96 mmol/L — ABNORMAL LOW (ref 98–111)
Creatinine, Ser: 0.8 mg/dL (ref 0.44–1.00)
GFR, Estimated: >60 mL/min (ref >60)
Glucose, Bld: 198 mg/dL — ABNORMAL HIGH (ref 70–99)
Potassium: 3.4 mmol/L — ABNORMAL LOW (ref 3.5–5.1)
Sodium: 132 mmol/L — ABNORMAL LOW (ref 135–145)

## 2021-09-05 LAB — GLUCOSE, CAPILLARY
Glucose-Capillary: 206 mg/dL — ABNORMAL HIGH (ref 70–99)
Glucose-Capillary: 195 mg/dL — ABNORMAL HIGH (ref 70–99)
Glucose-Capillary: 292 mg/dL — ABNORMAL HIGH (ref 70–99)
Glucose-Capillary: 272 mg/dL — ABNORMAL HIGH (ref 70–99)

## 2021-09-05 LAB — HEPARIN LEVEL (UNFRACTIONATED): Heparin Unfractionated: 0.3 IU/mL (ref 0.30–0.70)

## 2021-09-05 LAB — PROCALCITONIN: Procalcitonin: <0.1 ng/mL

## 2021-09-05 LAB — CBC
HCT: 27.6 % — ABNORMAL LOW (ref 36.0–46.0)
Hemoglobin: 8.7 g/dL — ABNORMAL LOW (ref 12.0–15.0)
MCH: 30.9 pg (ref 26.0–34.0)
MCHC: 31.5 g/dL (ref 30.0–36.0)
MCV: 97.9 fL (ref 80.0–100.0)
Platelets: 287 10*3/uL (ref 150–400)
RBC: 2.82 MIL/uL — ABNORMAL LOW (ref 3.87–5.11)
RDW: 16.6 % — ABNORMAL HIGH (ref 11.5–15.5)
WBC: 6.5 10*3/uL (ref 4.0–10.5)
nRBC: 0.5 % — ABNORMAL HIGH (ref 0.0–0.2)

## 2021-09-05 LAB — APTT: aPTT: 55 seconds — ABNORMAL HIGH (ref 24–36)

## 2021-09-05 SURGERY — LEFT HEART CATH AND CORS/GRAFTS ANGIOGRAPHY
Anesthesia: LOCAL

## 2021-09-05 MED ORDER — VERAPAMIL HCL 2.5 MG/ML IV SOLN
INTRAVENOUS | Status: DC | PRN
  Administered 2021-09-05: 10 mL via INTRA_ARTERIAL

## 2021-09-05 MED ORDER — HEPARIN (PORCINE) IN NACL 1000-0.9 UT/500ML-% IV SOLN
INTRAVENOUS | Status: AC
  Filled 2021-09-05: qty 500

## 2021-09-05 MED ORDER — HEPARIN (PORCINE) IN NACL 1000-0.9 UT/500ML-% IV SOLN
INTRAVENOUS | Status: DC | PRN
  Administered 2021-09-05 (×3): 500 mL

## 2021-09-05 MED ORDER — LIDOCAINE HCL (PF) 1 % IJ SOLN
INTRAMUSCULAR | Status: DC | PRN
  Administered 2021-09-05: 2 mL

## 2021-09-05 MED ORDER — POTASSIUM CHLORIDE CRYS ER 20 MEQ PO TBCR
40.0000 meq | EXTENDED_RELEASE_TABLET | Freq: Once | ORAL | Status: AC
  Administered 2021-09-05: 40 meq via ORAL
  Filled 2021-09-05: qty 2

## 2021-09-05 MED ORDER — MIDAZOLAM HCL 2 MG/2ML IJ SOLN
INTRAMUSCULAR | Status: AC
  Filled 2021-09-05: qty 2

## 2021-09-05 MED ORDER — HEPARIN SODIUM (PORCINE) 1000 UNIT/ML IJ SOLN
INTRAMUSCULAR | Status: AC
  Filled 2021-09-05: qty 10

## 2021-09-05 MED ORDER — HEPARIN (PORCINE) IN NACL 1000-0.9 UT/500ML-% IV SOLN
INTRAVENOUS | Status: AC
  Filled 2021-09-05: qty 1000

## 2021-09-05 MED ORDER — FENTANYL CITRATE (PF) 100 MCG/2ML IJ SOLN
INTRAMUSCULAR | Status: DC | PRN
  Administered 2021-09-05: 25 ug via INTRAVENOUS

## 2021-09-05 MED ORDER — FENTANYL CITRATE (PF) 100 MCG/2ML IJ SOLN
INTRAMUSCULAR | Status: AC
  Filled 2021-09-05: qty 2

## 2021-09-05 MED ORDER — VERAPAMIL HCL 2.5 MG/ML IV SOLN
INTRAVENOUS | Status: AC
  Filled 2021-09-05: qty 2

## 2021-09-05 MED ORDER — HEPARIN SODIUM (PORCINE) 1000 UNIT/ML IJ SOLN
INTRAMUSCULAR | Status: DC | PRN
  Administered 2021-09-05: 4500 [IU] via INTRAVENOUS

## 2021-09-05 MED ORDER — LIDOCAINE HCL (PF) 1 % IJ SOLN
INTRAMUSCULAR | Status: AC
  Filled 2021-09-05: qty 30

## 2021-09-05 MED ORDER — MIDAZOLAM HCL 2 MG/2ML IJ SOLN
INTRAMUSCULAR | Status: DC | PRN
  Administered 2021-09-05: 1 mg via INTRAVENOUS

## 2021-09-05 SURGICAL SUPPLY — 13 items
CATH INFINITI 5FR AL1 (CATHETERS) ×1 IMPLANT
CATH INFINITI JR4 5F (CATHETERS) ×1 IMPLANT
DEVICE RAD COMP TR BAND LRG (VASCULAR PRODUCTS) ×1 IMPLANT
GLIDESHEATH SLEND SS 6F .021 (SHEATH) ×1 IMPLANT
GUIDEWIRE INQWIRE 1.5J.035X260 (WIRE) IMPLANT
INQWIRE 1.5J .035X260CM (WIRE) ×2
KIT HEART LEFT (KITS) ×3 IMPLANT
MAT PREVALON FULL STRYKER (MISCELLANEOUS) ×1 IMPLANT
PACK CARDIAC CATHETERIZATION (CUSTOM PROCEDURE TRAY) ×3 IMPLANT
SHEATH PROBE COVER 6X72 (BAG) ×1 IMPLANT
TRANSDUCER W/STOPCOCK (MISCELLANEOUS) ×3 IMPLANT
TUBING CIL FLEX 10 FLL-RA (TUBING) ×3 IMPLANT
WIRE HI TORQ VERSACORE-J 145CM (WIRE) ×1 IMPLANT

## 2021-09-05 NOTE — H&P (View-Only) (Signed)
Progress Note  Patient Name: Katherine [name] Date of Encounter: 09/05/2021  [facility] Cardiologist: [name], MD   Subjective   Doing ok this morning. Breathing is stable.   Inpatient Medications    Scheduled Meds:  (feeding supplement) PROSource Plus  30 mL Oral BID BM   arformoterol  15 mcg Nebulization BID   vitamin C  500 mg Oral Daily   azaTHIOprine  125 mg Oral Daily   budesonide  2 mL Nebulization BID   calcitRIOL  0.25 mcg Oral Q3 days   Chlorhexidine Gluconate Cloth  6 each Topical Daily   clopidogrel  75 mg Oral Daily   diclofenac Sodium  2 g Topical TID   fluticasone  2 spray Each Nare Daily   gabapentin  100 mg Oral QHS   guaiFENesin-dextromethorphan  10 mL Oral QID   hydrocerin   Topical BID   hydrocortisone  25 mg Rectal BID   insulin aspart  0-9 Units Subcutaneous TID WC   insulin aspart  5 Units Subcutaneous TID WC   insulin glargine-yfgn  20 Units Subcutaneous Daily   isosorbide dinitrate  10 mg Oral BID   lamoTRIgine  25 mg Oral QHS   linaclotide  145 mcg Oral QAC breakfast   linagliptin  5 mg Oral Daily   loratadine  10 mg Oral Daily   magic mouthwash  5 mL Oral QID   metoprolol tartrate  25 mg Oral TID   montelukast  10 mg Oral QHS   multivitamin with minerals  1 tablet Oral Daily   nutrition supplement (JUVEN)  1 packet Oral BID BM   nystatin   Topical TID   pantoprazole  40 mg Oral Daily   polyethylene glycol  17 g Oral TID   predniSONE  5 mg Oral Q breakfast   Ensure Max Protein  11 oz Oral TID BM   rosuvastatin  20 mg Oral Daily   senna-docusate  2 tablet Oral BID   sertraline  100 mg Oral Daily   sodium chloride flush  10-40 mL Intracatheter Q12H   sodium chloride flush  3 mL Intravenous Q12H   terbinafine   Topical BID   zinc sulfate  220 mg Oral Daily   Continuous Infusions:  sodium chloride     sodium chloride 10 mL/hr at 09/05/21 0611   heparin 950 Units/hr (09/04/21 1033)   PRN Meds: sodium chloride,  acetaminophen, alum & mag hydroxide-simeth, bisacodyl, cyclobenzaprine, diphenhydrAMINE, guaiFENesin-dextromethorphan, hydrALAZINE, ipratropium-albuterol, LORazepam, melatonin, menthol-cetylpyridinium, morphine injection, nitroGLYCERIN, oxyCODONE, polyethylene glycol, prochlorperazine **OR** prochlorperazine **OR** prochlorperazine, sodium chloride, sodium chloride flush, sodium chloride flush, sodium phosphate   Vital Signs    Vitals:   09/04/21 2125 09/04/21 2219 09/05/21 0551 09/05/21 0756  BP: 120/62  (!) 100/57   Pulse: 94  86   Resp: 18  20   Temp: 99.6 F (37.6 C)  98.5 F (36.9 C)   TempSrc: Oral  Oral   SpO2: 92% 96% 94% 96%  Weight:   90 kg   Height:        Intake/Output Summary (Last 24 hours) at 09/05/2021 0829 Last data filed at 09/04/2021 1208 Gross per 24 hour  Intake 237 ml  Output 1590 ml  Net -1353 ml   Last 3 Weights 09/05/2021 09/04/2021 09/03/2021  Weight (lbs) 198 lb 6.6 oz 196 lb 13.9 oz 203 lb 0.7 oz  Weight (kg) 90 kg 89.3 kg 92.1 kg      Telemetry    Atrial fib  rates 80s - Personally Reviewed  ECG    No new tracing  Physical Exam   GEN: No acute distress.   Neck: No JVD Cardiac: Irreg Irreg, no murmurs, rubs, or gallops.  Respiratory: Clear to auscultation bilaterally. GI: Soft, nontender, non-distended  MS: No edema; No deformity. Neuro:  Nonfocal  Psych: Normal affect   Labs    High Sensitivity Troponin:   Recent Labs  Lab 08/14/21 1845 08/19/21 1003 08/19/21 1135 09/01/21 1826 09/02/21 0447  TROPONINIHS 351* 93* 76* 52* 66*     Chemistry Recent Labs  Lab 09/03/21 0419 09/04/21 0031 09/05/21 0246  NA 132* 131* 132*  K 3.8 3.9 3.4*  CL 99 97* 96*  CO2 25 25 25   GLUCOSE 206* 277* 198*  BUN 18 19 21   CREATININE 0.75 0.74 0.80  CALCIUM 8.5* 8.5* 8.7*  MG  --  1.9  --   GFRNONAA >60 >60 >60  ANIONGAP 8 9 11     Lipids No results for input(s): CHOL, TRIG, HDL, LABVLDL, LDLCALC, CHOLHDL in the last 168 hours.   Hematology Recent Labs  Lab 09/03/21 0419 09/04/21 0031 09/05/21 0246  WBC 5.7 6.1 6.5  RBC 2.69* 2.75* 2.82*  HGB 8.5* 8.7* 8.7*  HCT 27.3* 27.5* 27.6*  MCV 101.5* 100.0 97.9  MCH 31.6 31.6 30.9  MCHC 31.1 31.6 31.5  RDW 17.0* 16.9* 16.6*  PLT 186 225 287   Thyroid No results for input(s): TSH, FREET4 in the last 168 hours.  BNP Recent Labs  Lab 09/02/21 0447  BNP 539.6*    DDimer No results for input(s): DDIMER in the last 168 hours.   Radiology    No results found.  Cardiac Studies   N/a   Patient Profile     66 y.o. female patient of Dr. Allison Quarry admitted with [place] on 08/04/2021.  She has a history of remote CABG in 1995 with a LIMA to LAD, vein to diagonal branch and to the RCA cath June 2022 by Dr. Haroldine Laws via the right radial approach showed CTO of the native RCA and LAD with 60% native circumflex that was negative by FFR on 02/20/2021.  She had 2 layers of stent in her SVG to D2 and a patent vein graft to the RCA.  She does have a history of renal transplantation with preserved creatinine, AV fistula in left upper extremity and no access in lower extremities.  She was in 4 W/rehab and has had accelerated angina. She was also seen by Dr. 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   Assessment & Plan    CAD s/p CABG '95: Known history of complex coronary disease.  Recent attempt at cardiac catheterization on 1/5 but unable to obtain access.  Current angina.  Case was discussed by Dr. Gwenlyn Found and Dr. Ellyn Hack with plans for cardiac catheterization via right radial access with Dr. Ellyn Hack today. --On Plavix, Isodil, metoprolol 25mg  BID, statin, IV heparin    Shared Decision Making/Informed Consent{   The risks [stroke (1 in 1000), death (1 in 1000), kidney failure [usually temporary] (1 in 500), bleeding (1 in 200), allergic reaction [possibly serious] (1 in 200)], benefits  (diagnostic support and management of coronary artery disease) and alternatives of a cardiac catheterization were discussed in detail with Ms. Juliana and she is willing to proceed.   Atrial flutter/fib: Eliquis on hold, rate controlled  -- IV heparin with plans for cath -- on  metoprolol 25mg  BID   PAD: does complain of claudication. Seen by [facility], currently not felt to be a candidate for aortobifemoral bypass grafting due to CAD and co-morbidities.    Hx of renal transplant: follows with Dr. Johnney Ou  -- on Imuran and chronic prednisone -- Cr remains stable    HFrEF: Echo 07/2021 with LVEF of 45-50%. IV lasix 40mg  x2, net - 4.6L. Will hold on further diuresis for now   HLD: on statin    COPD/chronic hypoxic respiratory failure: on home O2  Hypokalemia: K+ 3.4 -- supplement  DM:  -- SSI    For questions or updates, please contact [place] Please consult [email or web address] for contact info under        Signed, Reino Bellis, NP  09/05/2021, 8:29 AM    Agree with note by Reino Bellis NP-C  Patient diuresed with good clinical results.  Eliquis on hold, scheduled to undergo cardiac catheterization this morning by Dr. Ellyn Hack via the right radial approach.  Lorretta Harp, M.D., [place], [facility], [place] [address] [place], [place]  [address]  [phone] 09/05/2021 9:14 AM

## 2021-09-05 NOTE — Interval H&P Note (Signed)
History and Physical Interval Note:  09/05/2021 9:24 AM  Katherine [name]  has presented today for surgery, with the diagnosis of nstemi with known CAD/CABG & PCI.  The various methods of treatment have been discussed with the patient and family. After consideration of risks, benefits and other options for treatment, the patient has consented to  Procedure(s): LEFT HEART CATH AND CORS/GRAFTS ANGIOGRAPHY (N/A)  PERCUTANEOUS CORONARY INTERVENTION  as a surgical intervention.  The patient's history has been reviewed, patient examined, no change in status, stable for surgery.  I have reviewed the patient's chart and labs.  Questions were answered to the patient's satisfaction.    Cath Lab Visit (complete for each Cath Lab visit)  Clinical Evaluation Leading to the Procedure:   ACS: Yes.    Non-ACS:    Anginal Classification: CCS IV  Anti-ischemic medical therapy: Maximal Therapy (2 or more classes of medications)  Non-Invasive Test Results: No non-invasive testing performed  Prior CABG: Previous CABG    [name]

## 2021-09-05 NOTE — Progress Notes (Signed)
ANTICOAGULATION CONSULT NOTE Pharmacy Consult for heparin Indication: chest pain/ACS and atrial fibrillation  Allergies  Allergen Reactions   Tetracycline Hives    Patient tolerated Doxycycline Dec 2020   Niacin Other (See Comments)    Mouth blisters   Niaspan [Niacin Er] Other (See Comments)    Mouth blisters   Sulfa Antibiotics Nausea Only and Other (See Comments)    "Tears up stomach"   Sulfonamide Derivatives Other (See Comments)    Reaction: per patient "tears her stomach up"   Codeine Nausea And Vomiting   Erythromycin Nausea And Vomiting   Hydromorphone Hcl Nausea And Vomiting   Morphine And Related Nausea And Vomiting   Nalbuphine Nausea And Vomiting    Nubain   Sulfasalazine Nausea Only and Other (See Comments)    per patient "tears her stomach up", "Tears up stomach"   Tape Rash and Other (See Comments)    No "plastic" tape," please----cloth tape only    Patient Measurements: Height: 5\' 1"  (154.9 cm) Weight: 90 kg (198 lb 6.6 oz) IBW/kg (Calculated) : 47.8 Heparin Dosing Weight: 66kg  Vital Signs: Temp: 98.5 F (36.9 C) (01/20 0551) Temp Source: Oral (01/20 0551) BP: 100/57 (01/20 0551) Pulse Rate: 86 (01/20 0551)  Labs: Recent Labs    09/03/21 0419 09/03/21 1144 09/04/21 0031 09/04/21 0710 09/05/21 0246  HGB 8.5*  --  8.7*  --  8.7*  HCT 27.3*  --  27.5*  --  27.6*  PLT 186  --  225  --  287  APTT 46*   < > 53* 48* 55*  HEPARINUNFRC >1.10*  --   --  0.47 0.30  CREATININE 0.75  --  0.74  --  0.80   < > = values in this interval not displayed.     Estimated Creatinine Clearance: 72.6 mL/min (by C-G formula based on SCr of 0.8 mg/dL).   Medical History: Past Medical History:  Diagnosis Date   Anemia    Anxiety    Bilateral carotid artery stenosis    Carotid duplex 02/3418: 3-79% LICA, 02-40% RICA, >97% RECA, f/u 1 yr suggested   CAD (coronary artery disease) of bypass graft 5/01; 3/'02, 8/'03, 10/'04; 1/15   PCI x 5 to SVG-D1    CAD in  native artery 07/1993   3 Vessel Disease (LAD-D1 & RCA) -- CABG (Dx in setting of inferior STEMI-PTCA of RCA)   CAD S/P percutaneous coronary angioplasty    PCI to SVG-D1 insertion/native D1 x 4 = '01 -(S660 BMS 2.5 x 9 anastomosis- D1); '02 - distal overlap ACS Pixel 2.5 x 8  BMS; '03 distal/native ISR/Thrombosis - Pixel 2.5 x 13; '04 - ISR-  Taxus 2.5 x 20 (covered all);; 1/15 - mid SVG-D1 (50% distal ISR) - Promus P 2.75 x 20 -- 2.8 mm; 6/22: Extensive ISR PTCA & Anastomotic-Native Diag DES PCI ([place] Onyx 2.25x12 (2.75-2.5 mm post-dilation   COPD mixed type (HCC)    Followed by Dr. Lamonte Sakai "pulmonologist said no COPD"   Depression with anxiety    Diabetes mellitus type 2 in obese ([place])    Diarrhea    started after cholecystectomy and mass removed from intestine   Dyslipidemia, goal LDL below 70    08/2012: TC 137, TG 200, HDL 32!, LDL 45; on statin (followed by Dr.Deterding)   ESRD (end stage renal disease) ([place]) 1991   s/p Cadaveric Renal Transplant [facility] - Dr. Jimmy Footman)    Family history of adverse reaction to anesthesia    mom's  bp dropped during/after anesthesia   Fibromyalgia    GERD (gastroesophageal reflux disease)    Glomerulonephritis, chronic, rapidly progressive 1989   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 07/1993   Rescue PTCA of RCA -- referred for CABG.   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  Had having having lots of nausea and vomiting with poor p.o. intake.  Thought to have QT prolongation with multiple medications involved and hypomagnesemia, hypokalemia.  Tikosyn discontinued along with Zoloft and Phenergan.   Hypertension associated with diabetes ([place])    Mild  aortic stenosis by prior echocardiogram 07/2019   Echo:  Mild aortic stenosis (gradients: Mean 14.3 mmHg -peak 24.9 mmHg).   Morbid obesity (HCC)    MRSA (methicillin resistant staph aureus) culture positive    OSA (obstructive sleep apnea)    no longer on CPAP or home O2, states she doesn't need now after lap band   PAD (peripheral artery disease) ([place]) 08/2013   LEA Dopplers to be read by Dr. Fletcher Anon   PAF (paroxysmal atrial fibrillation) ([place]) 06/2014   Noted on CardioNet Monitor  - --> rhythm control with Tikosyn (Dr. Rayann Heman); converted from warfarin to apixaban for anticoagulation.   Pneumonia    Recurrent boils    Bilateral Groin   Rheumatoid arthritis ([place])    Per Patient Report; associated with OA   S/p cadaver renal transplant 1991   [facility]   Torsades de pointes 05/12/2021   Unstable angina ([place]) 5/01; 3/'02, 8/'03, 10/'04; 1/15   x 5 occurences since Inf-Post STEMI in 1994   Assessment: 66 year old female previously on inpatient rehab now admitted to progressive unit d/t multiple medical issues. Cardiology consulted yesterday due to new chest pain and afib with rvr. Patient has been on apixaban since 1/6. New orders to transition back to IV heparin with plans for cath on Friday.   APTT just below goal this AM at 55s on 950 units/hr.  Current hemoglobin stable at 8.7, plt improved. Heparin level close to correlating at 0.3. Plan for cath today, will hold off on rate increases.   Goal of Therapy:  Heparin level 0.3-0.7 units/ml aPTT 66-102 seconds Monitor platelets by anticoagulation protocol: Yes   Plan:  Continue heparin at 950 units/hr Follow up plan after cath  Erin Hearing PharmD., BCPS Clinical Pharmacist 09/05/2021 7:28 AM

## 2021-09-05 NOTE — Progress Notes (Addendum)
Progress Note  Patient Name: Katherine [name] Date of Encounter: 09/05/2021  [facility] Cardiologist: [name], MD   Subjective   Doing ok this morning. Breathing is stable.   Inpatient Medications    Scheduled Meds:  (feeding supplement) PROSource Plus  30 mL Oral BID BM   arformoterol  15 mcg Nebulization BID   vitamin C  500 mg Oral Daily   azaTHIOprine  125 mg Oral Daily   budesonide  2 mL Nebulization BID   calcitRIOL  0.25 mcg Oral Q3 days   Chlorhexidine Gluconate Cloth  6 each Topical Daily   clopidogrel  75 mg Oral Daily   diclofenac Sodium  2 g Topical TID   fluticasone  2 spray Each Nare Daily   gabapentin  100 mg Oral QHS   guaiFENesin-dextromethorphan  10 mL Oral QID   hydrocerin   Topical BID   hydrocortisone  25 mg Rectal BID   insulin aspart  0-9 Units Subcutaneous TID WC   insulin aspart  5 Units Subcutaneous TID WC   insulin glargine-yfgn  20 Units Subcutaneous Daily   isosorbide dinitrate  10 mg Oral BID   lamoTRIgine  25 mg Oral QHS   linaclotide  145 mcg Oral QAC breakfast   linagliptin  5 mg Oral Daily   loratadine  10 mg Oral Daily   magic mouthwash  5 mL Oral QID   metoprolol tartrate  25 mg Oral TID   montelukast  10 mg Oral QHS   multivitamin with minerals  1 tablet Oral Daily   nutrition supplement (JUVEN)  1 packet Oral BID BM   nystatin   Topical TID   pantoprazole  40 mg Oral Daily   polyethylene glycol  17 g Oral TID   predniSONE  5 mg Oral Q breakfast   Ensure Max Protein  11 oz Oral TID BM   rosuvastatin  20 mg Oral Daily   senna-docusate  2 tablet Oral BID   sertraline  100 mg Oral Daily   sodium chloride flush  10-40 mL Intracatheter Q12H   sodium chloride flush  3 mL Intravenous Q12H   terbinafine   Topical BID   zinc sulfate  220 mg Oral Daily   Continuous Infusions:  sodium chloride     sodium chloride 10 mL/hr at 09/05/21 0611   heparin 950 Units/hr (09/04/21 1033)   PRN Meds: sodium chloride,  acetaminophen, alum & mag hydroxide-simeth, bisacodyl, cyclobenzaprine, diphenhydrAMINE, guaiFENesin-dextromethorphan, hydrALAZINE, ipratropium-albuterol, LORazepam, melatonin, menthol-cetylpyridinium, morphine injection, nitroGLYCERIN, oxyCODONE, polyethylene glycol, prochlorperazine **OR** prochlorperazine **OR** prochlorperazine, sodium chloride, sodium chloride flush, sodium chloride flush, sodium phosphate   Vital Signs    Vitals:   09/04/21 2125 09/04/21 2219 09/05/21 0551 09/05/21 0756  BP: 120/62  (!) 100/57   Pulse: 94  86   Resp: 18  20   Temp: 99.6 F (37.6 C)  98.5 F (36.9 C)   TempSrc: Oral  Oral   SpO2: 92% 96% 94% 96%  Weight:   90 kg   Height:        Intake/Output Summary (Last 24 hours) at 09/05/2021 0829 Last data filed at 09/04/2021 1208 Gross per 24 hour  Intake 237 ml  Output 1590 ml  Net -1353 ml   Last 3 Weights 09/05/2021 09/04/2021 09/03/2021  Weight (lbs) 198 lb 6.6 oz 196 lb 13.9 oz 203 lb 0.7 oz  Weight (kg) 90 kg 89.3 kg 92.1 kg      Telemetry    Atrial fib  rates 80s - Personally Reviewed  ECG    No new tracing  Physical Exam   GEN: No acute distress.   Neck: No JVD Cardiac: Irreg Irreg, no murmurs, rubs, or gallops.  Respiratory: Clear to auscultation bilaterally. GI: Soft, nontender, non-distended  MS: No edema; No deformity. Neuro:  Nonfocal  Psych: Normal affect   Labs    High Sensitivity Troponin:   Recent Labs  Lab 08/14/21 1845 08/19/21 1003 08/19/21 1135 09/01/21 1826 09/02/21 0447  TROPONINIHS 351* 93* 76* 52* 66*     Chemistry Recent Labs  Lab 09/03/21 0419 09/04/21 0031 09/05/21 0246  NA 132* 131* 132*  K 3.8 3.9 3.4*  CL 99 97* 96*  CO2 25 25 25   GLUCOSE 206* 277* 198*  BUN 18 19 21   CREATININE 0.75 0.74 0.80  CALCIUM 8.5* 8.5* 8.7*  MG  --  1.9  --   GFRNONAA >60 >60 >60  ANIONGAP 8 9 11     Lipids No results for input(s): CHOL, TRIG, HDL, LABVLDL, LDLCALC, CHOLHDL in the last 168 hours.   Hematology Recent Labs  Lab 09/03/21 0419 09/04/21 0031 09/05/21 0246  WBC 5.7 6.1 6.5  RBC 2.69* 2.75* 2.82*  HGB 8.5* 8.7* 8.7*  HCT 27.3* 27.5* 27.6*  MCV 101.5* 100.0 97.9  MCH 31.6 31.6 30.9  MCHC 31.1 31.6 31.5  RDW 17.0* 16.9* 16.6*  PLT 186 225 287   Thyroid No results for input(s): TSH, FREET4 in the last 168 hours.  BNP Recent Labs  Lab 09/02/21 0447  BNP 539.6*    DDimer No results for input(s): DDIMER in the last 168 hours.   Radiology    No results found.  Cardiac Studies   N/a   Patient Profile     66 y.o. female patient of Dr. Allison Quarry admitted with [facility] on 08/04/2021.  She has a history of remote CABG in 1995 with a LIMA to LAD, vein to diagonal branch and to the RCA cath June 2022 by Dr. Haroldine Laws via the right radial approach showed CTO of the native RCA and LAD with 60% native circumflex that was negative by FFR on 02/20/2021.  She had 2 layers of stent in her SVG to D2 and a patent vein graft to the RCA.  She does have a history of renal transplantation with preserved creatinine, AV fistula in left upper extremity and no access in lower extremities.  She was in 4 W/rehab and has had accelerated angina. She was also seen by Dr. 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   Assessment & Plan    CAD s/p CABG '95: Known history of complex coronary disease.  Recent attempt at cardiac catheterization on 1/5 but unable to obtain access.  Current angina.  Case was discussed by Dr. Gwenlyn Found and Dr. Ellyn Hack with plans for cardiac catheterization via right radial access with Dr. Ellyn Hack today. --On Plavix, Isodil, metoprolol 25mg  BID, statin, IV heparin    Shared Decision Making/Informed Consent{   The risks [stroke (1 in 1000), death (1 in 1000), kidney failure [usually temporary] (1 in 500), bleeding (1 in 200), allergic reaction [possibly serious] (1 in 200)], benefits  (diagnostic support and management of coronary artery disease) and alternatives of a cardiac catheterization were discussed in detail with Ms. Laprise and she is willing to proceed.   Atrial flutter/fib: Eliquis on hold, rate controlled  -- IV heparin with plans for cath -- on  metoprolol 25mg  BID   PAD: does complain of claudication. Seen by [facility], currently not felt to be a candidate for aortobifemoral bypass grafting due to CAD and co-morbidities.    Hx of renal transplant: follows with Dr. Johnney Ou  -- on Imuran and chronic prednisone -- Cr remains stable    HFrEF: Echo 07/2021 with LVEF of 45-50%. IV lasix 40mg  x2, net - 4.6L. Will hold on further diuresis for now   HLD: on statin    COPD/chronic hypoxic respiratory failure: on home O2  Hypokalemia: K+ 3.4 -- supplement  DM:  -- SSI    For questions or updates, please contact [place] Please consult [email or web address] for contact info under        Signed, Reino Bellis, NP  09/05/2021, 8:29 AM    Agree with note by Reino Bellis NP-C  Patient diuresed with good clinical results.  Eliquis on hold, scheduled to undergo cardiac catheterization this morning by Dr. Ellyn Hack via the right radial approach.  Lorretta Harp, M.D., [place], [facility], [place] [address] [place], [place]  [address]  [phone] 09/05/2021 9:14 AM

## 2021-09-05 NOTE — Progress Notes (Signed)
Physical Therapy Treatment Patient Details Name: Katherine [name] MRN: [id number] DOB: [birth year] Today's Date: 09/05/2021   History of Present Illness Pt is a 66 y/o F admitted 07/25/21-08/25/21 for COVID wtih MRSA, PNA, hospital course complicated by NSTEMI. Discharged to AIR where she was progressing mobility however with increased activity experiencing increased R LE claudical pain as well as chest pain and increased HR/A-fib. Pt returned to hospital 09/02/21 for treatment of PAD R LE>LE and reattempt heart catheterization 09/05/21. Failed cardiac cath 1/20. PMH includes CAD s/p CABG and PCI, COPD at home on 3L O2 via [place], DM2, s/p renal transplant, anxiety, depression, anemia, fibromyalgia, and GERD.    PT Comments    Pt initially anxious and resistive to mobility this date. However, after extended period of active listening to pt's concerns and providing pt education on importance of mobility to reduce risk of muscle atrophy and deconditioning and improve cardiopulmonary function and muscle strength and endurance, she was agreeable to attempt mobility. Pt able to progress to standing and taking steps anterior <> posterior at EOB with L UE support on RW and min guard-minA. Pt's stability would likely be better if she had her R hand to assist in supporting her, but was limited by wrist in splint s/p failed heart cath this date. Encouraged OOB to chair over weekend with nursing and to progress gait distance in future therapy sessions, pt verbalized understanding. Will continue to follow acutely. Current recommendations remain appropriate.    Recommendations for follow up therapy are one component of a multi-disciplinary discharge planning process, led by the attending physician.  Recommendations may be updated based on patient status, additional functional criteria and insurance authorization.  Follow Up Recommendations  Home health PT (may require PTAR home due to 5 steps to enter, pending  progression)     Assistance Recommended at Discharge Frequent or constant Supervision/Assistance  Patient can return home with the following A lot of help with bathing/dressing/bathroom;Assistance with cooking/housework;Assistance with feeding;Assist for transportation;Help with stairs or ramp for entrance;A little help with walking and/or transfers   Equipment Recommendations  None recommended by PT    Recommendations for Other Services       Precautions / Restrictions Precautions Precautions: Fall Precaution Comments: monitor sats/chest pain/anxiety Restrictions Weight Bearing Restrictions: No     Mobility  Bed Mobility Overal bed mobility: Needs Assistance Bed Mobility: Supine to Sit, Sit to Supine     Supine to sit: Supervision, HOB elevated Sit to supine: Supervision, HOB elevated   General bed mobility comments: Pt able to transition supine <> sit L EOB with HOB elevated without using R hand (in splint following attempted heart cath today) without assistance.    Transfers Overall transfer level: Needs assistance Equipment used: Rolling walker (2 wheels) Transfers: Sit to/from Stand Sit to Stand: Min guard           General transfer comment: Cued pt to use L hand only as R hand/wrist limited in splint following failed heart cath today. Min guard for safety powering up to stand from EOB 2x.    Ambulation/Gait Ambulation/Gait assistance: Min guard, Min assist Gait Distance (Feet): 30 Feet Assistive device: Rolling walker (2 wheels) Gait Pattern/deviations: Step-through pattern, Decreased stride length Gait velocity: decr Gait velocity interpretation: <1.8 ft/sec, indicate of risk for recurrent falls   General Gait Details: Pt taking steps anterior <> posterior repeatedly at EOB to encourage mobility progression and reduce pt anxiety. Pt able to complete with mild instability and occasional poor control/coordination  of feet and RW, but pt unable to use R hand  due to splint on wrist s/p failed heart cath. Min guard-minA for safety. HR up to 100s, SpO2 stable on [place]   Stairs             Wheelchair Mobility    Modified Rankin (Stroke Patients Only)       Balance Overall balance assessment: Needs assistance Sitting-balance support: Feet supported Sitting balance-Leahy Scale: Good Sitting balance - Comments: sits unsupported EOB with supervision.   Standing balance support: Single extremity supported, Reliant on assistive device for balance, During functional activity Standing balance-Leahy Scale: Poor Standing balance comment: Reliant on 1 UE support and min guard-minA`                            Cognition Arousal/Alertness: Awake/alert Behavior During Therapy: Anxious, WFL for tasks assessed/performed Overall Cognitive Status: Within Functional Limits for tasks assessed                                 General Comments: Pt initially resistive to mobility due to her lungs not working great and her legs almost giving out under her yesterday per pt. Provided active listening then asked pt to allow PT to educate her on research in regards to muscle atrophy/deconditioning with staying in bed/not mobilizing etc. Pt was agreeable and listened to PT and then allowed PT to progress mobility to tolerance today.        Exercises      General Comments General comments (skin integrity, edema, etc.): educated pt on importance of progressing mobility to facilitate increased independence, encouraged pt to sit up in recliner over weekend to improve pulmonary function      Pertinent Vitals/Pain Pain Assessment Pain Assessment: Faces Faces Pain Scale: Hurts a little bit Pain Location: L wrist Pain Descriptors / Indicators: Discomfort, Operative site guarding Pain Intervention(s): Limited activity within patient's tolerance, Monitored during session, Repositioned (kept wrist in splint and did not use hand)    Home  Living                          Prior Function            PT Goals (current goals can now be found in the care plan section) Acute Rehab PT Goals Patient Stated Goal: to go home PT Goal Formulation: With patient Time For Goal Achievement: 09/17/21 Potential to Achieve Goals: Good Progress towards PT goals: Progressing toward goals    Frequency    Min 3X/week      PT Plan Current plan remains appropriate    Co-evaluation              AM-PAC PT "6 Clicks" Mobility   Outcome Measure  Help needed turning from your back to your side while in a flat bed without using bedrails?: A Little Help needed moving from lying on your back to sitting on the side of a flat bed without using bedrails?: A Little Help needed moving to and from a bed to a chair (including a wheelchair)?: A Little Help needed standing up from a chair using your arms (e.g., wheelchair or bedside chair)?: A Little Help needed to walk in hospital room?: A Little Help needed climbing 3-5 steps with a railing? : A Lot 6 Click Score: 17    End of  Session Equipment Utilized During Treatment: Gait belt;Oxygen Activity Tolerance: Patient tolerated treatment well Patient left: in bed;with call bell/phone within reach;with bed alarm set;with nursing/sitter in room Nurse Communication: Mobility status PT Visit Diagnosis: Muscle weakness (generalized) (M62.81);Difficulty in walking, not elsewhere classified (R26.2);Unsteadiness on feet (R26.81);Other abnormalities of gait and mobility (R26.89)     Time: 0397-9536 PT Time Calculation (min) (ACUTE ONLY): 37 min  Charges:  $Gait Training: 8-22 mins $Therapeutic Activity: 8-22 mins                     Moishe Spice, PT, DPT Acute Rehabilitation Services  Pager: [phone] Office: [place] 09/05/2021, 5:35 PM

## 2021-09-05 NOTE — Progress Notes (Signed)
PROGRESS NOTE    Katherine [name]  TMH:[id number] DOB: 1955/11/26 DOA: 09/02/2021 PCP: [name], MD    No chief complaint on file.   Brief Narrative:  CAD s/p CABG and PCI, bilateral carotid artery stenosis, pvd, paf n eliquis, COPD home oxygen dependent on 3 L, IDDM2, s/p renal transplant on immunosuppressant,   urolithiasis, anemia, history of GI bleed,fibromyalgia/chronic pain , She had been admitted on 07/25/2021-08/25/2021, for COVID with MRSA pneumonia.  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  Since the cardiac cath was attempted patient reports that she has had right leg pain at rest.  She is seen by vascular surgery for possible intervention, patient understands she is high risk for any type of procedure but she elects to proceed, she is discharged from [place] and admitted to hospitalist service Cards plan to do cardiac cath on friday  Subjective:  She is seen after returned from cardiac cath She denies chest pain, no sob Currently denies right lower leg/foot pain   Assessment & Plan:   Active Problems:   Dyslipidemia, goal LDL below 70   CAD S/P percutaneous coronary angioplasty - PCI x 5 to SVG-D1   Debility   Chronic respiratory failure with hypoxia (HCC)   COPD (chronic obstructive pulmonary disease) (HCC)   History of renal transplant   Obesity (BMI 30-39.9)   Chronic diastolic CHF (congestive heart failure) (HCC)   Rest pain of lower extremity due to atherosclerosis (HCC)  Peripheral artery disease./ RLL On Plavix and statin No plans for revascularization currently due to hight risk for procedure  ,Management per vascular surgery  Chronic A. Fib Currently in A. fib, rate controlled on beta-blocker apixaban held, currently on heparin drip Cardiology following, management per  cardiology  CAD. S/p CABG Recent NSTEMI in last hospitalization, failed cardiac cath attemptx2 (1/5 and 1/20)  On Plavix, statin, imdur Currently on heparin drip due to holding apixaban Currently denies chest pain Plan per cardiology   Chronic combined CHF Last LVEF 45 to 50% in 07/2021 Prn IV Lasix  Currently does not appear overtly volume overloaded Continue monitor volume status Management per cardiology  COPD chronic hypoxic respiratory failure on home O2 stable  Insulin-dependent type 2 diabetes Hold metformin due to possible procedure Continue adjust insulin as needed Continue Tradjenta  Renal transplant on immunosuppressant  Back in 1991.  Patient is followed by Dr. Johnney Ou in the outpatient setting On Imuran and chronic prednisone Creatinine stable    Body mass index is 37.49 kg/m.[name] Kitchen    Unresulted Labs (From admission, onward)     Start     Ordered   09/05/21 0500  Heparin level (unfractionated)  Daily,   R     Question:  Specimen collection method  Answer:  Unit=Unit collect   09/04/21 0724   09/05/21 0500  APTT  Daily,   R      09/04/21 1031   09/03/21 0500  CBC  Daily,   R     Question:  Specimen collection method  Answer:  Unit=Unit collect   09/02/21 1425              DVT prophylaxis:   Currently on heparin drip, transition to eliquis when ok with cardiology    Code Status: DNR Family Communication: Patient, she declined my offer to update her Niece  Disposition:   Status is: Inpatient  Dispo: The patient is from: Inpatient rehab              Anticipated d/c is to: TBD              Anticipated d/c date is: TBD                 Consultants:  Vascular surgery cardiology   Procedures:  unsuccessful cardiac cath attempt on 1/20  Antimicrobials:   Anti-infectives (From admission, onward)    None           Objective: Vitals:   09/05/21 0949 09/05/21 0953 09/05/21 0958 09/05/21 1046  BP: (!) 56/35 (!) 85/50 (!) 91/54 (!)  104/58  Pulse: 68 67 69 67  Resp: 19 (!) 26 19 19   Temp:    98.4 F (36.9 C)  TempSrc:    Oral  SpO2: 99% 98% (!) 0% 94%  Weight:      Height:        Intake/Output Summary (Last 24 hours) at 09/05/2021 1215 Last data filed at 09/05/2021 0835 Gross per 24 hour  Intake --  Output 900 ml  Net -900 ml   Filed Weights   09/03/21 0406 09/04/21 0505 09/05/21 0551  Weight: 92.1 kg 89.3 kg 90 kg    Examination:  General exam: chronically ill appearing, alert, awake, communicative,calm, NAD Respiratory system: mild intermittent crackles, no wheezing, no rhonchi, Respiratory effort normal. Cardiovascular system:  RRR.  Gastrointestinal system: Abdomen is nondistended, soft and nontender.  Normal bowel sounds heard. Central nervous system: Alert and oriented. No focal neurological deficits. Extremities:  no edema Skin: No rashes, lesions or ulcers Psychiatry: Judgement and insight appear normal. Mood & affect appropriate.     Data Reviewed: I have personally reviewed following labs and imaging studies  CBC: Recent Labs  Lab 09/01/21 0345 09/02/21 0447 09/03/21 0419 09/04/21 0031 09/05/21 0246  WBC 5.4 7.0 5.7 6.1 6.5  HGB 8.6* 9.2* 8.5* 8.7* 8.7*  HCT 28.4* 29.7* 27.3* 27.5* 27.6*  MCV 104.8* 103.8* 101.5* 100.0 97.9  PLT 137* 173 186 225 314    Basic Metabolic Panel: Recent Labs  Lab 09/01/21 0345 09/03/21 0419 09/04/21 0031 09/05/21 0246  NA 135 132* 131* 132*  K 4.0 3.8 3.9 3.4*  CL 101 99 97* 96*  CO2 28 25 25 25   GLUCOSE 284* 206* 277* 198*  BUN 23 18 19 21   CREATININE 0.75 0.75 0.74 0.80  CALCIUM 8.8* 8.5* 8.5* 8.7*  MG  --   --  1.9  --     GFR: Estimated Creatinine Clearance: 72.6 mL/min (by C-G formula based on SCr of 0.8 mg/dL).  Liver Function Tests: No results for input(s): AST, ALT, ALKPHOS, BILITOT, PROT, ALBUMIN in the last 168 hours.  CBG: Recent Labs  Lab 09/04/21 1323 09/04/21 1631 09/04/21 2120 09/05/21 0743 09/05/21 1125   GLUCAP 356* 252* 137* 206* 195*     No results found for this or any previous visit (from the past 240 hour(s)).       Radiology Studies: CARDIAC CATHETERIZATION  Result Date: 09/05/2021 Unsuccessful attempt at Left Heart Catheterization with Coronary and GraftAngiography via radial access.  Extensive radial artery spasm, unable to pass catheter beyond elbow. With wire removal, there was evidence of forearm hematoma being held with manual pressure. Only other potential option for angiography would be to consider ulnar access on the right arm versus brachial arterial access at a later date. [name],  MD       Scheduled Meds:  (feeding supplement) PROSource Plus  30 mL Oral BID BM   arformoterol  15 mcg Nebulization BID   vitamin C  500 mg Oral Daily   azaTHIOprine  125 mg Oral Daily   budesonide  2 mL Nebulization BID   calcitRIOL  0.25 mcg Oral Q3 days   Chlorhexidine Gluconate Cloth  6 each Topical Daily   clopidogrel  75 mg Oral Daily   diclofenac Sodium  2 g Topical TID   fluticasone  2 spray Each Nare Daily   gabapentin  100 mg Oral QHS   guaiFENesin-dextromethorphan  10 mL Oral QID   hydrocerin   Topical BID   hydrocortisone  25 mg Rectal BID   insulin aspart  0-9 Units Subcutaneous TID WC   insulin aspart  5 Units Subcutaneous TID WC   insulin glargine-yfgn  20 Units Subcutaneous Daily   isosorbide dinitrate  10 mg Oral BID   lamoTRIgine  25 mg Oral QHS   linaclotide  145 mcg Oral QAC breakfast   linagliptin  5 mg Oral Daily   loratadine  10 mg Oral Daily   magic mouthwash  5 mL Oral QID   metoprolol tartrate  25 mg Oral TID   montelukast  10 mg Oral QHS   multivitamin with minerals  1 tablet Oral Daily   nutrition supplement (JUVEN)  1 packet Oral BID BM   nystatin   Topical TID   pantoprazole  40 mg Oral Daily   polyethylene glycol  17 g Oral TID   predniSONE  5 mg Oral Q breakfast   Ensure Max Protein  11 oz Oral TID BM   rosuvastatin  20 mg Oral  Daily   senna-docusate  2 tablet Oral BID   sertraline  100 mg Oral Daily   sodium chloride flush  10-40 mL Intracatheter Q12H   sodium chloride flush  3 mL Intravenous Q12H   terbinafine   Topical BID   zinc sulfate  220 mg Oral Daily   Continuous Infusions:  heparin Stopped (09/05/21 0825)     LOS: 3 days   Time spent: 47mins Greater than 50% of this time was spent in counseling, explanation of diagnosis, planning of further management, and coordination of care.   Voice Recognition Viviann Spare dictation system was used to create this note, attempts have been made to correct errors. Please contact the author with questions and/or clarifications.   Florencia Reasons, MD PhD FACP [facility]  Available via Epic secure chat 7am-7pm for nonurgent issues Please page for urgent issues To page the attending provider between 7A-7P or the covering provider during after hours 7P-7A, please log into the web site [email or web address] and access using universal [place] password for that web site. If you do not have the password, please call the hospital operator.    09/05/2021, 12:15 PM

## 2021-09-05 NOTE — Care Management Important Message (Signed)
Important Message  Patient Details  Name: Katherine [name] MRN: [id number] Date of Birth: 08-23-1955   Medicare Important Message Given:  Yes     [name] 09/05/2021, 10:34 AM

## 2021-09-06 DIAGNOSIS — I739 Peripheral vascular disease, unspecified: Secondary | ICD-10-CM

## 2021-09-06 LAB — CBC
HCT: 26.3 % — ABNORMAL LOW (ref 36.0–46.0)
Hemoglobin: 8.2 g/dL — ABNORMAL LOW (ref 12.0–15.0)
MCH: 30.8 pg (ref 26.0–34.0)
MCHC: 31.2 g/dL (ref 30.0–36.0)
MCV: 98.9 fL (ref 80.0–100.0)
Platelets: 334 10*3/uL (ref 150–400)
RBC: 2.66 MIL/uL — ABNORMAL LOW (ref 3.87–5.11)
RDW: 17 % — ABNORMAL HIGH (ref 11.5–15.5)
WBC: 5.7 10*3/uL (ref 4.0–10.5)
nRBC: 0.3 % — ABNORMAL HIGH (ref 0.0–0.2)

## 2021-09-06 LAB — GLUCOSE, CAPILLARY
Glucose-Capillary: 225 mg/dL — ABNORMAL HIGH (ref 70–99)
Glucose-Capillary: 321 mg/dL — ABNORMAL HIGH (ref 70–99)
Glucose-Capillary: 319 mg/dL — ABNORMAL HIGH (ref 70–99)
Glucose-Capillary: 195 mg/dL — ABNORMAL HIGH (ref 70–99)

## 2021-09-06 LAB — BASIC METABOLIC PANEL
Anion gap: 10 (ref 5–15)
BUN: 25 mg/dL — ABNORMAL HIGH (ref 8–23)
CO2: 21 mmol/L — ABNORMAL LOW (ref 22–32)
Calcium: 8.7 mg/dL — ABNORMAL LOW (ref 8.9–10.3)
Chloride: 99 mmol/L (ref 98–111)
Creatinine, Ser: 0.84 mg/dL (ref 0.44–1.00)
GFR, Estimated: >60 mL/min (ref >60)
Glucose, Bld: 335 mg/dL — ABNORMAL HIGH (ref 70–99)
Potassium: 4 mmol/L (ref 3.5–5.1)
Sodium: 130 mmol/L — ABNORMAL LOW (ref 135–145)

## 2021-09-06 LAB — MAGNESIUM: Magnesium: 1.9 mg/dL (ref 1.7–2.4)

## 2021-09-06 MED ORDER — ALTEPLASE 2 MG IJ SOLR
2.0000 mg | Freq: Once | INTRAMUSCULAR | Status: AC
  Administered 2021-09-06: 14:00:00 2 mg
  Filled 2021-09-06: qty 2

## 2021-09-06 MED ORDER — INSULIN ASPART 100 UNIT/ML IJ SOLN
7.0000 [IU] | Freq: Three times a day (TID) | INTRAMUSCULAR | Status: DC
  Administered 2021-09-06 – 2021-09-09 (×9): 7 [IU] via SUBCUTANEOUS

## 2021-09-06 MED ORDER — APIXABAN 5 MG PO TABS
5.0000 mg | ORAL_TABLET | Freq: Two times a day (BID) | ORAL | Status: DC
  Administered 2021-09-06 – 2021-09-11 (×11): 5 mg via ORAL
  Filled 2021-09-06 (×11): qty 1

## 2021-09-06 MED ORDER — ALTEPLASE 2 MG IJ SOLR
2.0000 mg | Freq: Once | INTRAMUSCULAR | Status: AC
  Administered 2021-09-06: 14:00:00 2 mg
  Filled 2021-09-06 (×2): qty 2

## 2021-09-06 NOTE — Progress Notes (Signed)
PROGRESS NOTE    Katherine [name]  LPF:[id number] DOB: June 08, 1956 DOA: 09/02/2021 PCP: [name], MD    No chief complaint on file.   Brief Narrative:  CAD s/p CABG and PCI, bilateral carotid artery stenosis, pvd, paf n eliquis, COPD home oxygen dependent on 3 L, IDDM2, s/p renal transplant on immunosuppressant,   urolithiasis, anemia, history of GI bleed,fibromyalgia/chronic pain , She had been admitted on 07/25/2021-08/25/2021, for COVID with MRSA pneumonia.  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  Since the cardiac cath was attempted patient reports that she has had right leg pain at rest.  She is seen by vascular surgery for possible intervention, patient understands she is high risk for any type of procedure but she elects to proceed, she is discharged from [facility] and admitted to hospitalist service Cards plan to do cardiac cath on friday  Subjective:  She is sitting up in chair, denies foot pain or chest pain, no sob, no cough, no edema Her blood glucose is elevated this am, reports it could go as high as in the 400 and as low as in the 70's at home prior to admission   Assessment & Plan:   Active Problems:   Dyslipidemia, goal LDL below 70   CAD S/P percutaneous coronary angioplasty - PCI x 5 to SVG-D1   Debility   Chronic respiratory failure with hypoxia (HCC)   COPD (chronic obstructive pulmonary disease) (HCC)   History of renal transplant   Obesity (BMI 30-39.9)   Chronic diastolic CHF (congestive heart failure) (HCC)   Rest pain of lower extremity due to atherosclerosis (HCC)  Peripheral artery disease./ RLL On Plavix and statin No plans for revascularization currently due to hight risk for procedure  ,Management per vascular surgery  Chronic A. Fib Currently in A. fib, rate controlled on  beta-blocker apixaban held, currently on heparin drip Cardiology following, management per cardiology  CAD. S/p CABG Recent NSTEMI in last hospitalization, failed cardiac cath attemptx2 (1/5 and 1/20)  On Plavix, statin, imdur Currently on heparin drip due to holding apixaban Currently denies chest pain Plan per cardiology   Chronic combined CHF Last LVEF 45 to 50% in 07/2021 Prn IV Lasix  Currently does not appear overtly volume overloaded Continue monitor volume status Management per cardiology  COPD chronic hypoxic respiratory failure on home O2 stable  Insulin-dependent type 2 diabetes Hold metformin due to possible procedure Continue adjust insulin as needed, continue long acting insulin, increase meal coverage, continue ssi Continue Tradjenta  Renal transplant on immunosuppressant  Back in 1991.  Patient is followed by Dr. Johnney Ou in the outpatient setting On Imuran and chronic prednisone Creatinine stable    Body mass index is 39.16 kg/m.[name] Kitchen    Unresulted Labs (From admission, onward)     Start     Ordered   09/03/21 0500  CBC  Daily,   R     Question:  Specimen collection method  Answer:  Unit=Unit collect   09/02/21 1425              DVT prophylaxis:   Currently on heparin drip, transition to eliquis when ok with cardiology  apixaban (ELIQUIS) tablet 5 mg   Code Status: DNR Family Communication: Patient, she declined my offer to update her Niece  Disposition:   Status is: Inpatient  Dispo:  The patient is from: Inpatient rehab              Anticipated d/c is to: TBD              Anticipated d/c date is: TBD, needs cardiology clearance                  Consultants:  Vascular surgery cardiology   Procedures:  unsuccessful cardiac cath attempt on 1/20  Antimicrobials:   Anti-infectives (From admission, onward)    None           Objective: Vitals:   09/06/21 0740 09/06/21 0756 09/06/21 1210 09/06/21 1253  BP:  (!) 111/57 134/79    Pulse:  68 97 84  Resp:  20 20 (!) 21  Temp:  98.5 F (36.9 C) 98.2 F (36.8 C)   TempSrc:  Oral Oral   SpO2: 97% 97% 97% 94%  Weight:      Height:        Intake/Output Summary (Last 24 hours) at 09/06/2021 1303 Last data filed at 09/06/2021 1009 Gross per 24 hour  Intake 287 ml  Output --  Net 287 ml   Filed Weights   09/04/21 0505 09/05/21 0551 09/06/21 0643  Weight: 89.3 kg 90 kg 94 kg    Examination:  General exam: chronically ill appearing, alert, awake, communicative,calm, NAD Respiratory system: mild intermittent crackles, no wheezing, no rhonchi, Respiratory effort normal. Cardiovascular system:  RRR.  Gastrointestinal system: Abdomen is nondistended, soft and nontender.  Normal bowel sounds heard. Central nervous system: Alert and oriented. No focal neurological deficits. Extremities:  no edema Skin: No rashes, lesions or ulcers Psychiatry: Judgement and insight appear normal. Mood & affect appropriate.     Data Reviewed: I have personally reviewed following labs and imaging studies  CBC: Recent Labs  Lab 09/02/21 0447 09/03/21 0419 09/04/21 0031 09/05/21 0246 09/06/21 0224  WBC 7.0 5.7 6.1 6.5 5.7  HGB 9.2* 8.5* 8.7* 8.7* 8.2*  HCT 29.7* 27.3* 27.5* 27.6* 26.3*  MCV 103.8* 101.5* 100.0 97.9 98.9  PLT 173 186 225 287 016    Basic Metabolic Panel: Recent Labs  Lab 09/01/21 0345 09/03/21 0419 09/04/21 0031 09/05/21 0246 09/06/21 0224  NA 135 132* 131* 132* 130*  K 4.0 3.8 3.9 3.4* 4.0  CL 101 99 97* 96* 99  CO2 28 25 25 25  21*  GLUCOSE 284* 206* 277* 198* 335*  BUN 23 18 19 21  25*  CREATININE 0.75 0.75 0.74 0.80 0.84  CALCIUM 8.8* 8.5* 8.5* 8.7* 8.7*  MG  --   --  1.9  --  1.9    GFR: Estimated Creatinine Clearance: 70.8 mL/min (by C-G formula based on SCr of 0.84 mg/dL).  Liver Function Tests: No results for input(s): AST, ALT, ALKPHOS, BILITOT, PROT, ALBUMIN in the last 168 hours.  CBG: Recent Labs  Lab 09/05/21 1125  09/05/21 1715 09/05/21 2132 09/06/21 0753 09/06/21 1208  GLUCAP 195* 292* 272* 225* 321*     No results found for this or any previous visit (from the past 240 hour(s)).       Radiology Studies: CARDIAC CATHETERIZATION  Result Date: 09/05/2021 Unsuccessful attempt at Left Heart Catheterization with Coronary and GraftAngiography via radial access.  Extensive radial artery spasm, unable to pass catheter beyond elbow. With wire removal, there was evidence of forearm hematoma being held with manual pressure. Only other potential option for angiography would be to consider ulnar access on the right arm versus brachial arterial access  at a later date. [name], MD       Scheduled Meds:  (feeding supplement) PROSource Plus  30 mL Oral BID BM   alteplase  2 mg Intracatheter Once   alteplase  2 mg Intracatheter Once   apixaban  5 mg Oral BID   arformoterol  15 mcg Nebulization BID   vitamin C  500 mg Oral Daily   azaTHIOprine  125 mg Oral Daily   budesonide  2 mL Nebulization BID   calcitRIOL  0.25 mcg Oral Q3 days   Chlorhexidine Gluconate Cloth  6 each Topical Daily   clopidogrel  75 mg Oral Daily   diclofenac Sodium  2 g Topical TID   fluticasone  2 spray Each Nare Daily   gabapentin  100 mg Oral QHS   guaiFENesin-dextromethorphan  10 mL Oral QID   hydrocerin   Topical BID   hydrocortisone  25 mg Rectal BID   insulin aspart  0-9 Units Subcutaneous TID WC   insulin aspart  7 Units Subcutaneous TID WC   insulin glargine-yfgn  20 Units Subcutaneous Daily   isosorbide dinitrate  10 mg Oral BID   lamoTRIgine  25 mg Oral QHS   linaclotide  145 mcg Oral QAC breakfast   linagliptin  5 mg Oral Daily   loratadine  10 mg Oral Daily   magic mouthwash  5 mL Oral QID   metoprolol tartrate  25 mg Oral TID   montelukast  10 mg Oral QHS   multivitamin with minerals  1 tablet Oral Daily   nutrition supplement (JUVEN)  1 packet Oral BID BM   nystatin   Topical TID   pantoprazole   40 mg Oral Daily   polyethylene glycol  17 g Oral TID   predniSONE  5 mg Oral Q breakfast   Ensure Max Protein  11 oz Oral TID BM   rosuvastatin  20 mg Oral Daily   senna-docusate  2 tablet Oral BID   sertraline  100 mg Oral Daily   sodium chloride flush  10-40 mL Intracatheter Q12H   sodium chloride flush  3 mL Intravenous Q12H   terbinafine   Topical BID   zinc sulfate  220 mg Oral Daily   Continuous Infusions:     LOS: 4 days   Time spent: 61mins Greater than 50% of this time was spent in counseling, explanation of diagnosis, planning of further management, and coordination of care.   Voice Recognition Viviann Spare dictation system was used to create this note, attempts have been made to correct errors. Please contact the author with questions and/or clarifications.   Florencia Reasons, MD PhD FACP [facility]  Available via Epic secure chat 7am-7pm for nonurgent issues Please page for urgent issues To page the attending provider between 7A-7P or the covering provider during after hours 7P-7A, please log into the web site [email or web address] and access using universal [place] password for that web site. If you do not have the password, please call the hospital operator.    09/06/2021, 1:03 PM

## 2021-09-06 NOTE — Progress Notes (Signed)
Occupational Therapy Treatment Patient Details Name: Katherine [name] MRN: [id number] DOB: 1956/08/08 Today's Date: 09/06/2021   History of present illness Pt is a 66 y/o F admitted 07/25/21-08/25/21 for COVID wtih MRSA, PNA, hospital course complicated by NSTEMI. Discharged to [facility] where she was progressing mobility however with increased activity experiencing increased R LE claudical pain as well as chest pain and increased HR/A-fib. Pt returned to hospital 09/02/21 for treatment of PAD R LE>LE and reattempt heart catheterization 09/05/21. Failed cardiac cath 1/20. PMH includes CAD s/p CABG and PCI, COPD at home on 3L O2 via [place], DM2, s/p renal transplant, anxiety, depression, anemia, fibromyalgia, and GERD.   OT comments  Patient more agreeable to out of bed activity this session. Patient mod I with bed mobility and supervision level with transfer first to bedside commode then to recliner chair. Patient stating she still does not want to use R UE to assist with wiping after bowel movement therefore needing total A for perianal care. Otherwise patient progressing well with functional transfers and self care.    Recommendations for follow up therapy are one component of a multi-disciplinary discharge planning process, led by the attending physician.  Recommendations may be updated based on patient status, additional functional criteria and insurance authorization.    Follow Up Recommendations  Home health OT    Assistance Recommended at Discharge Intermittent Supervision/Assistance  Patient can return home with the following  A little help with walking and/or transfers;A little help with bathing/dressing/bathroom;Assist for transportation;Help with stairs or ramp for entrance;Assistance with cooking/housework   Equipment Recommendations  None recommended by OT       Precautions / Restrictions Precautions Precautions: Fall Precaution Comments: monitor sats/chest pain/anxiety Restrictions Weight  Bearing Restrictions: No       Mobility Bed Mobility Overal bed mobility: Modified Independent Bed Mobility: Supine to Sit, Sit to Supine     Supine to sit: Modified independent (Device/Increase time) Sit to supine: Modified independent (Device/Increase time)           Balance Overall balance assessment: No apparent balance deficits (not formally assessed)                                         ADL either performed or assessed with clinical judgement   ADL Overall ADL's : Needs assistance/impaired                         Toilet Transfer: Armed forces technical officer Details (indicate cue type and reason): Patient able to take few steps over towards bedside commode without physical assistance. Then transferred to recliner chair. Toileting- Clothing Manipulation and Hygiene: Total assistance;Sit to/from stand Toileting - Clothing Manipulation Details (indicate cue type and reason): After bowel movement patient needing total A for perianal care, patient stating she does not want to use R hand yet after failed cath despite splint being gone and it being over 24hrs since procedure     Functional mobility during ADLs: Supervision/safety        Cognition Arousal/Alertness: Awake/alert Behavior During Therapy: WFL for tasks assessed/performed Overall Cognitive Status: Within Functional Limits for tasks assessed  Pertinent Vitals/ Pain       Pain Assessment Pain Assessment: Faces Faces Pain Scale: No hurt         Frequency  Min 2X/week        Progress Toward Goals  OT Goals(current goals can now be found in the care plan section)  Progress towards OT goals: Progressing toward goals  Acute Rehab OT Goals Patient Stated Goal: use commode OT Goal Formulation: With patient Time For Goal Achievement: 09/17/21 Potential to Achieve Goals: Good ADL  Goals Pt Will Perform Lower Body Bathing: with adaptive equipment;with modified independence Pt Will Perform Lower Body Dressing: with modified independence;sit to/from stand Pt Will Transfer to Toilet: with modified independence;ambulating Pt Will Perform Toileting - Clothing Manipulation and hygiene: with modified independence Pt Will Perform Tub/Shower Transfer: with mod assist;ambulating;rolling walker Pt/caregiver will Perform Home Exercise Program: Increased strength;Both right and left upper extremity;With theraband;Independently Additional ADL Goal #1: Independently demosntrate 3 energy conservation strategies during ADL session Additional ADL Goal #2: independently verbalize 3 strategies to reduce risk of falls  Plan Discharge plan remains appropriate       AM-PAC OT "6 Clicks" Daily Activity     Outcome Measure   Help from another person eating meals?: None Help from another person taking care of personal grooming?: A Little Help from another person toileting, which includes using toliet, bedpan, or urinal?: A Lot Help from another person bathing (including washing, rinsing, drying)?: A Little Help from another person to put on and taking off regular upper body clothing?: A Little Help from another person to put on and taking off regular lower body clothing?: A Little 6 Click Score: 18    End of Session Equipment Utilized During Treatment: Oxygen  OT Visit Diagnosis: Other abnormalities of gait and mobility (R26.89);Muscle weakness (generalized) (M62.81);Pain Pain - Right/Left: Right Pain - part of body: Leg   Activity Tolerance Patient tolerated treatment well   Patient Left in chair;with call bell/phone within reach   Nurse Communication Mobility status        Time: 4917-9150 OT Time Calculation (min): 26 min  Charges: OT General Charges $OT Visit: 1 Visit OT Treatments $Self Care/Home Management : 23-37 mins  Delbert Phenix OT OT pager: [email or web address] 09/06/2021, 1:57 PM

## 2021-09-06 NOTE — Progress Notes (Signed)
Progress Note  Patient Name: Katherine [name] Date of Encounter: 09/06/2021  [facility] Cardiologist: [name], MD   Subjective   No chest pain Right arm feels better   Inpatient Medications    Scheduled Meds:  (feeding supplement) PROSource Plus  30 mL Oral BID BM   apixaban  5 mg Oral BID   arformoterol  15 mcg Nebulization BID   vitamin C  500 mg Oral Daily   azaTHIOprine  125 mg Oral Daily   budesonide  2 mL Nebulization BID   calcitRIOL  0.25 mcg Oral Q3 days   Chlorhexidine Gluconate Cloth  6 each Topical Daily   clopidogrel  75 mg Oral Daily   diclofenac Sodium  2 g Topical TID   fluticasone  2 spray Each Nare Daily   gabapentin  100 mg Oral QHS   guaiFENesin-dextromethorphan  10 mL Oral QID   hydrocerin   Topical BID   hydrocortisone  25 mg Rectal BID   insulin aspart  0-9 Units Subcutaneous TID WC   insulin aspart  5 Units Subcutaneous TID WC   insulin glargine-yfgn  20 Units Subcutaneous Daily   isosorbide dinitrate  10 mg Oral BID   lamoTRIgine  25 mg Oral QHS   linaclotide  145 mcg Oral QAC breakfast   linagliptin  5 mg Oral Daily   loratadine  10 mg Oral Daily   magic mouthwash  5 mL Oral QID   metoprolol tartrate  25 mg Oral TID   montelukast  10 mg Oral QHS   multivitamin with minerals  1 tablet Oral Daily   nutrition supplement (JUVEN)  1 packet Oral BID BM   nystatin   Topical TID   pantoprazole  40 mg Oral Daily   polyethylene glycol  17 g Oral TID   predniSONE  5 mg Oral Q breakfast   Ensure Max Protein  11 oz Oral TID BM   rosuvastatin  20 mg Oral Daily   senna-docusate  2 tablet Oral BID   sertraline  100 mg Oral Daily   sodium chloride flush  10-40 mL Intracatheter Q12H   sodium chloride flush  3 mL Intravenous Q12H   terbinafine   Topical BID   zinc sulfate  220 mg Oral Daily   Continuous Infusions:   PRN Meds: acetaminophen, alum & mag hydroxide-simeth, bisacodyl, cyclobenzaprine, diphenhydrAMINE,  guaiFENesin-dextromethorphan, hydrALAZINE, ipratropium-albuterol, LORazepam, melatonin, menthol-cetylpyridinium, morphine injection, nitroGLYCERIN, oxyCODONE, polyethylene glycol, prochlorperazine **OR** prochlorperazine **OR** prochlorperazine, sodium chloride, sodium chloride flush, sodium phosphate   Vital Signs    Vitals:   09/05/21 2200 09/06/21 0643 09/06/21 0740 09/06/21 0756  BP: 115/61 (!) 124/109  (!) 111/57  Pulse: 84 66  68  Resp: (!) 24 (!) 21  20  Temp:  98 F (36.7 C)  98.5 F (36.9 C)  TempSrc:  Oral  Oral  SpO2: 100% 98% 97% 97%  Weight:  94 kg    Height:        Intake/Output Summary (Last 24 hours) at 09/06/2021 0951 Last data filed at 09/05/2021 1500 Gross per 24 hour  Intake 257 ml  Output --  Net 257 ml   Last 3 Weights 09/06/2021 09/05/2021 09/04/2021  Weight (lbs) 207 lb 3.7 oz 198 lb 6.6 oz 196 lb 13.9 oz  Weight (kg) 94 kg 90 kg 89.3 kg      Telemetry    Atrial fib rates 80s - Personally Reviewed  ECG    No new tracing  Physical Exam  Obese female Lungs clear SEM Right arm soft with less hematoma PVD with decrease peripheral pulses and no palpable Femoral pulses  Transplanted kideny LLQ abdomen   Labs    High Sensitivity Troponin:   Recent Labs  Lab 08/14/21 1845 08/19/21 1003 08/19/21 1135 09/01/21 1826 09/02/21 0447  TROPONINIHS 351* 93* 76* 52* 66*     Chemistry Recent Labs  Lab 09/04/21 0031 09/05/21 0246 09/06/21 0224  NA 131* 132* 130*  K 3.9 3.4* 4.0  CL 97* 96* 99  CO2 25 25 21*  GLUCOSE 277* 198* 335*  BUN 19 21 25*  CREATININE 0.74 0.80 0.84  CALCIUM 8.5* 8.7* 8.7*  MG 1.9  --   --   GFRNONAA >60 >60 >60  ANIONGAP 9 11 10     Lipids No results for input(s): CHOL, TRIG, HDL, LABVLDL, LDLCALC, CHOLHDL in the last 168 hours.  Hematology Recent Labs  Lab 09/04/21 0031 09/05/21 0246 09/06/21 0224  WBC 6.1 6.5 5.7  RBC 2.75* 2.82* 2.66*  HGB 8.7* 8.7* 8.2*  HCT 27.5* 27.6* 26.3*  MCV 100.0 97.9 98.9  MCH  31.6 30.9 30.8  MCHC 31.6 31.5 31.2  RDW 16.9* 16.6* 17.0*  PLT 225 287 334   Thyroid No results for input(s): TSH, FREET4 in the last 168 hours.  BNP Recent Labs  Lab 09/02/21 0447  BNP 539.6*    DDimer No results for input(s): DDIMER in the last 168 hours.   Radiology    CARDIAC CATHETERIZATION  Result Date: 09/05/2021 Unsuccessful attempt at Left Heart Catheterization with Coronary and GraftAngiography via radial access.  Extensive radial artery spasm, unable to pass catheter beyond elbow. With wire removal, there was evidence of forearm hematoma being held with manual pressure. Only other potential option for angiography would be to consider ulnar access on the right arm versus brachial arterial access at a later date. [name], MD   Cardiac Studies   N/a   Patient Profile     66 y.o. female patient of Dr. Allison Quarry admitted with [facility] on 08/04/2021.  She has a history of remote CABG in 1995 with a LIMA to LAD, vein to diagonal branch and to the RCA cath June 2022 by Dr. Haroldine Laws via the right radial approach showed CTO of the native RCA and LAD with 60% native circumflex that was negative by FFR on 02/20/2021.  She had 2 layers of stent in her SVG to D2 and a patent vein graft to the RCA.  She does have a history of renal transplantation with preserved creatinine, AV fistula in left upper extremity and no access in lower extremities.  She was in 4 W/rehab and has had accelerated angina. She was also seen by Dr. 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   Assessment & Plan    CAD s/p CABG '95: Known history of complex coronary disease.  Now with two unsuccessful attempts at cath from right radial yesterday complicated by hematoma. She has no leg access with PVD and previous [name] fistula He angina is controlled with medical Rx Only option in future if she presented with acute ST elevation MI  would be for VVS to do a cut down on brachial artery with arteriotomy Continue nitrates, beta blocker plavix and statin    Atrial flutter/fib: Eliquis on hold, rate controlled  -- resume eliquis  -- on metoprolol 25mg  BID   PAD: does complain of claudication. Seen by  VVS, currently not felt to be a candidate for aortobifemoral bypass grafting due to CAD and co-morbidities.    Hx of renal transplant: follows with Dr. Johnney Ou  -- on Imuran and chronic prednisone -- Cr remains stable 0.84    HFrEF: Echo 07/2021 with LVEF of 45-50%. IV lasix 40mg  x2, net - 4.6L. Will hold on further diuresis for now   HLD: on statin    COPD/chronic hypoxic respiratory failure: on home O2  DM:  -- SSI    For questions or updates, please contact [place] Please consult [email or web address] for contact info under      Signed, Jenkins Rouge, MD  09/06/2021, 9:51 AM

## 2021-09-07 ENCOUNTER — Inpatient Hospital Stay (HOSPITAL_COMMUNITY): Payer: Medicare Other

## 2021-09-07 DIAGNOSIS — Z940 Kidney transplant status: Secondary | ICD-10-CM

## 2021-09-07 DIAGNOSIS — J9611 Chronic respiratory failure with hypoxia: Secondary | ICD-10-CM

## 2021-09-07 LAB — CBC
HCT: 26.4 % — ABNORMAL LOW (ref 36.0–46.0)
Hemoglobin: 8.3 g/dL — ABNORMAL LOW (ref 12.0–15.0)
MCH: 30.9 pg (ref 26.0–34.0)
MCHC: 31.4 g/dL (ref 30.0–36.0)
MCV: 98.1 fL (ref 80.0–100.0)
Platelets: 386 10*3/uL (ref 150–400)
RBC: 2.69 MIL/uL — ABNORMAL LOW (ref 3.87–5.11)
RDW: 16.7 % — ABNORMAL HIGH (ref 11.5–15.5)
WBC: 6.9 10*3/uL (ref 4.0–10.5)
nRBC: 0.6 % — ABNORMAL HIGH (ref 0.0–0.2)

## 2021-09-07 LAB — BASIC METABOLIC PANEL
Anion gap: 9 (ref 5–15)
BUN: 19 mg/dL (ref 8–23)
CO2: 26 mmol/L (ref 22–32)
Calcium: 9 mg/dL (ref 8.9–10.3)
Chloride: 98 mmol/L (ref 98–111)
Creatinine, Ser: 0.79 mg/dL (ref 0.44–1.00)
GFR, Estimated: >60 mL/min (ref >60)
Glucose, Bld: 311 mg/dL — ABNORMAL HIGH (ref 70–99)
Potassium: 4 mmol/L (ref 3.5–5.1)
Sodium: 133 mmol/L — ABNORMAL LOW (ref 135–145)

## 2021-09-07 LAB — GLUCOSE, CAPILLARY
Glucose-Capillary: 246 mg/dL — ABNORMAL HIGH (ref 70–99)
Glucose-Capillary: 264 mg/dL — ABNORMAL HIGH (ref 70–99)
Glucose-Capillary: 228 mg/dL — ABNORMAL HIGH (ref 70–99)

## 2021-09-07 MED ORDER — FUROSEMIDE 10 MG/ML IJ SOLN
40.0000 mg | Freq: Every day | INTRAMUSCULAR | Status: AC
  Administered 2021-09-08 – 2021-09-09 (×2): 40 mg via INTRAVENOUS
  Filled 2021-09-07 (×2): qty 4

## 2021-09-07 MED ORDER — SENNOSIDES-DOCUSATE SODIUM 8.6-50 MG PO TABS
2.0000 | ORAL_TABLET | Freq: Every day | ORAL | Status: DC
  Administered 2021-09-08: 2 via ORAL
  Filled 2021-09-07 (×2): qty 2

## 2021-09-07 MED ORDER — FUROSEMIDE 10 MG/ML IJ SOLN
40.0000 mg | Freq: Once | INTRAMUSCULAR | Status: AC
  Administered 2021-09-07: 40 mg via INTRAVENOUS
  Filled 2021-09-07: qty 4

## 2021-09-07 MED ORDER — IPRATROPIUM-ALBUTEROL 0.5-2.5 (3) MG/3ML IN SOLN
3.0000 mL | Freq: Two times a day (BID) | RESPIRATORY_TRACT | Status: DC
  Filled 2021-09-07 (×5): qty 3

## 2021-09-07 MED ORDER — GUAIFENESIN ER 600 MG PO TB12
600.0000 mg | ORAL_TABLET | Freq: Two times a day (BID) | ORAL | Status: DC
  Administered 2021-09-07 – 2021-09-11 (×9): 600 mg via ORAL
  Filled 2021-09-07 (×9): qty 1

## 2021-09-07 MED ORDER — FUROSEMIDE 10 MG/ML IJ SOLN
40.0000 mg | Freq: Once | INTRAMUSCULAR | Status: AC
Start: 2021-09-07 — End: 2021-09-07
  Administered 2021-09-07: 40 mg via INTRAVENOUS
  Filled 2021-09-07: qty 4

## 2021-09-07 NOTE — Progress Notes (Signed)
I went to give patient a breathing treatment as ordered, patient stated that she already got 3 breathing treatments since this morning, and she said that she is in A-fib and does not wish to make her A-Fib even worse, she said no thank you.

## 2021-09-07 NOTE — Progress Notes (Signed)
Notified MD Florencia Reasons and MD Johnsie Cancel of pt dyspnea, requiring increased O2 from 3L to 5L overnight, as well as patient reported feeling more short of breath and 2+ edema noted in ankles and feet upon assessment, as well as wheezing and crackles in both lung fields.   Order received for 40 mg IV lasix and portable cxr. I also gave the patient PRN neb treatment. Will continue to monitor.

## 2021-09-07 NOTE — Progress Notes (Signed)
Progress Note  Patient Name: Katherine [name] Date of Encounter: 09/07/2021  [facility] Cardiologist: [name], MD   Subjective   Some dyspnea given dose of lasix this am  Arm feels ok   Inpatient Medications    Scheduled Meds:  (feeding supplement) PROSource Plus  30 mL Oral BID BM   apixaban  5 mg Oral BID   arformoterol  15 mcg Nebulization BID   vitamin C  500 mg Oral Daily   azaTHIOprine  125 mg Oral Daily   budesonide  2 mL Nebulization BID   calcitRIOL  0.25 mcg Oral Q3 days   Chlorhexidine Gluconate Cloth  6 each Topical Daily   clopidogrel  75 mg Oral Daily   diclofenac Sodium  2 g Topical TID   fluticasone  2 spray Each Nare Daily   [START ON 09/08/2021] furosemide  40 mg Intravenous Daily   gabapentin  100 mg Oral QHS   guaiFENesin-dextromethorphan  10 mL Oral QID   hydrocerin   Topical BID   hydrocortisone  25 mg Rectal BID   insulin aspart  0-9 Units Subcutaneous TID WC   insulin aspart  7 Units Subcutaneous TID WC   insulin glargine-yfgn  20 Units Subcutaneous Daily   isosorbide dinitrate  10 mg Oral BID   lamoTRIgine  25 mg Oral QHS   linaclotide  145 mcg Oral QAC breakfast   linagliptin  5 mg Oral Daily   loratadine  10 mg Oral Daily   magic mouthwash  5 mL Oral QID   metoprolol tartrate  25 mg Oral TID   montelukast  10 mg Oral QHS   multivitamin with minerals  1 tablet Oral Daily   nutrition supplement (JUVEN)  1 packet Oral BID BM   nystatin   Topical TID   pantoprazole  40 mg Oral Daily   polyethylene glycol  17 g Oral TID   predniSONE  5 mg Oral Q breakfast   Ensure Max Protein  11 oz Oral TID BM   rosuvastatin  20 mg Oral Daily   senna-docusate  2 tablet Oral BID   sertraline  100 mg Oral Daily   sodium chloride flush  10-40 mL Intracatheter Q12H   sodium chloride flush  3 mL Intravenous Q12H   terbinafine   Topical BID   zinc sulfate  220 mg Oral Daily   Continuous Infusions:   PRN Meds: acetaminophen, alum & mag  hydroxide-simeth, bisacodyl, cyclobenzaprine, diphenhydrAMINE, guaiFENesin-dextromethorphan, hydrALAZINE, ipratropium-albuterol, LORazepam, melatonin, menthol-cetylpyridinium, morphine injection, nitroGLYCERIN, oxyCODONE, polyethylene glycol, prochlorperazine **OR** prochlorperazine **OR** prochlorperazine, sodium chloride, sodium chloride flush, sodium phosphate   Vital Signs    Vitals:   09/07/21 0751 09/07/21 0811 09/07/21 0904 09/07/21 0917  BP:   (!) 115/49 129/70  Pulse:    86  Resp:   (!) 24   Temp:      TempSrc:      SpO2: 98% 98%    Weight:      Height:        Intake/Output Summary (Last 24 hours) at 09/07/2021 1002 Last data filed at 09/06/2021 2225 Gross per 24 hour  Intake 490 ml  Output 225 ml  Net 265 ml   Last 3 Weights 09/07/2021 09/06/2021 09/05/2021  Weight (lbs) 206 lb 2.1 oz 207 lb 3.7 oz 198 lb 6.6 oz  Weight (kg) 93.5 kg 94 kg 90 kg      Telemetry    Atrial fib rates 80s - Personally Reviewed  ECG  No new tracing  Physical Exam   Obese female Lungs  COPD rhonchi  SEM Right arm soft with less hematoma PVD with decrease peripheral pulses and no palpable Femoral pulses  Transplanted kideny LLQ abdomen   Labs    High Sensitivity Troponin:   Recent Labs  Lab 08/14/21 1845 08/19/21 1003 08/19/21 1135 09/01/21 1826 09/02/21 0447  TROPONINIHS 351* 93* 76* 52* 66*     Chemistry Recent Labs  Lab 09/04/21 0031 09/05/21 0246 09/06/21 0224  NA 131* 132* 130*  K 3.9 3.4* 4.0  CL 97* 96* 99  CO2 25 25 21*  GLUCOSE 277* 198* 335*  BUN 19 21 25*  CREATININE 0.74 0.80 0.84  CALCIUM 8.5* 8.7* 8.7*  MG 1.9  --  1.9  GFRNONAA >60 >60 >60  ANIONGAP 9 11 10     Lipids No results for input(s): CHOL, TRIG, HDL, LABVLDL, LDLCALC, CHOLHDL in the last 168 hours.  Hematology Recent Labs  Lab 09/05/21 0246 09/06/21 0224 09/07/21 0322  WBC 6.5 5.7 6.9  RBC 2.82* 2.66* 2.69*  HGB 8.7* 8.2* 8.3*  HCT 27.6* 26.3* 26.4*  MCV 97.9 98.9 98.1  MCH  30.9 30.8 30.9  MCHC 31.5 31.2 31.4  RDW 16.6* 17.0* 16.7*  PLT 287 334 386   Thyroid No results for input(s): TSH, FREET4 in the last 168 hours.  BNP Recent Labs  Lab 09/02/21 0447  BNP 539.6*    DDimer No results for input(s): DDIMER in the last 168 hours.   Radiology    No results found.  Cardiac Studies   N/a   Patient Profile     66 y.o. female patient of Dr. Allison Quarry admitted with [facility] on 08/04/2021.  She has a history of remote CABG in 1995 with a LIMA to LAD, vein to diagonal branch and to the RCA cath June 2022 by Dr. Haroldine Laws via the right radial approach showed CTO of the native RCA and LAD with 60% native circumflex that was negative by FFR on 02/20/2021.  She had 2 layers of stent in her SVG to D2 and a patent vein graft to the RCA.  She does have a history of renal transplantation with preserved creatinine, AV fistula in left upper extremity and no access in lower extremities.  She was in 4 W/rehab and has had accelerated angina. She was also seen by Dr. 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   Assessment & Plan    CAD s/p CABG '95: Known history of complex coronary disease.  Now with two unsuccessful attempts at cath from right radial yesterday complicated by hematoma. She has no leg access with PVD and previous [name] fistula He angina is controlled with medical Rx Only option in future if she presented with acute ST elevation MI would be for VVS to do a cut down on brachial artery with arteriotomy Continue nitrates, beta blocker plavix and statin    Atrial flutter/fib: Eliquis on hold, rate controlled  -- back on eliquis  -- on metoprolol 25mg  BID   PAD: does complain of claudication. Seen by VVS, currently not felt to be a candidate for aortobifemoral bypass grafting due to CAD and co-morbidities.    Hx of renal transplant: follows with Dr. Johnney Ou  -- on Imuran and chronic  prednisone -- Cr remains stable 0.84    HFrEF: Echo 07/2021 with LVEF of 45-50%. Some dyspnea this am with low sats She has  COPD on home oxygen Lasix iv this am Check portable CXR   HLD: on statin    COPD/chronic hypoxic respiratory failure: on home O2  DM:  -- SSI    For questions or updates, please contact [place] Please consult [email or web address] for contact info under      Signed, Jenkins Rouge, MD  09/07/2021, 10:02 AM

## 2021-09-07 NOTE — Progress Notes (Signed)
PROGRESS NOTE    Katherine [name]  CHE:[id number] DOB: Sep 02, 1955 DOA: 09/02/2021 PCP: [name], MD    No chief complaint on file.   Brief Narrative:  CAD s/p CABG and PCI, bilateral carotid artery stenosis, pvd, paf n eliquis, COPD home oxygen dependent on 3 L, IDDM2, s/p renal transplant on immunosuppressant,   urolithiasis, anemia, history of GI bleed,fibromyalgia/chronic pain , She had been admitted on 07/25/2021-08/25/2021, for COVID with MRSA pneumonia.  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  Since the cardiac cath was attempted patient reports that she has had right leg pain at rest.  She is seen by vascular surgery for possible intervention, patient understands she is high risk for any type of procedure but she elects to proceed, she is discharged from [facility] and admitted to hospitalist service Cards plan to do cardiac cath on friday  Subjective:  Increase edema, weight is up, oxygen requirement increased She was wheezing this morning required neb treatment Urine output does not appear to be accurate   She denies foot pain or chest pain  Assessment & Plan:   Active Problems:   Dyslipidemia, goal LDL below 70   CAD S/P percutaneous coronary angioplasty - PCI x 5 to SVG-D1   Debility   Chronic respiratory failure with hypoxia (HCC)   COPD (chronic obstructive pulmonary disease) (HCC)   History of renal transplant   Obesity (BMI 30-39.9)   Chronic diastolic CHF (congestive heart failure) (HCC)   Rest pain of lower extremity due to atherosclerosis (HCC)  Peripheral artery disease./ RLL On Plavix and statin No plans for revascularization currently due to hight risk for procedure  ,Management per vascular surgery  Chronic A. Fib Currently in A. fib, rate controlled on beta-blocker, apixaban   Cardiology following, management per cardiology  CAD. S/p CABG Recent NSTEMI in last hospitalization, failed cardiac cath attemptx2 (1/5 and 1/20)  On Plavix, statin, imdur, apixaban Currently denies chest pain Plan per cardiology   Acute on Chronic combined CHF, on prn lasix at home Last LVEF 45 to 50% in 07/2021 Sob, wheezing this am,  iv lasix, cxr Continue monitor volume status Management per cardiology  COPD chronic hypoxic respiratory failure on home O2 On scheduled neb bid at home stable  Insulin-dependent type 2 diabetes Hold metformin due to possible procedure Continue adjust insulin as needed, continue long acting insulin, increase meal coverage, continue ssi Continue Tradjenta  Renal transplant on immunosuppressant  Back in 1991.  Patient is followed by Dr. Johnney Ou in the outpatient setting On Imuran and chronic prednisone Creatinine stable    Body mass index is 38.95 kg/m.[name] Kitchen    Unresulted Labs (From admission, onward)     Start     Ordered   09/08/21 5361  Basic metabolic panel  Daily,   R     Question:  Specimen collection method  Answer:  Unit=Unit collect   09/07/21 0917   09/08/21 0500  Magnesium  Tomorrow morning,   STAT       Question:  Specimen collection method  Answer:  Unit=Unit collect   09/07/21 1459              DVT prophylaxis:   apixaban (ELIQUIS) tablet 5 mg   Code Status: DNR Family Communication: Patient, she declined my offer to update her Niece  Disposition:  Status is: Inpatient  Dispo: The patient is from: Inpatient rehab              Anticipated d/c is to: TBD              Anticipated d/c date is: TBD, needs cardiology clearance                  Consultants:  Vascular surgery cardiology   Procedures:  unsuccessful cardiac cath attempt on 1/20  Antimicrobials:   Anti-infectives (From admission, onward)    None           Objective: Vitals:   09/07/21 0904 09/07/21 0917 09/07/21 1230 09/07/21 1300   BP: (!) 115/49 129/70 (!) 93/48 111/66  Pulse:  86 73   Resp: (!) 24  20 18   Temp:   98.7 F (37.1 C)   TempSrc:   Oral   SpO2:   92%   Weight:      Height:        Intake/Output Summary (Last 24 hours) at 09/07/2021 1502 Last data filed at 09/07/2021 1200 Gross per 24 hour  Intake 590 ml  Output 1325 ml  Net -735 ml   Filed Weights   09/05/21 0551 09/06/21 0643 09/07/21 0500  Weight: 90 kg 94 kg 93.5 kg    Examination:  General exam: chronically ill appearing, alert, awake, communicative,calm, NAD Respiratory system: no wheezing, no rhonchi, Respiratory effort normal. ( Was wheezing earlier this am) Cardiovascular system:  IRRR.  Gastrointestinal system: Abdomen is nondistended, soft and nontender.  Normal bowel sounds heard. Central nervous system: Alert and oriented. No focal neurological deficits. Extremities:  no edema Skin: No rashes, lesions or ulcers Psychiatry: Judgement and insight appear normal. Mood & affect appropriate.     Data Reviewed: I have personally reviewed following labs and imaging studies  CBC: Recent Labs  Lab 09/03/21 0419 09/04/21 0031 09/05/21 0246 09/06/21 0224 09/07/21 0322  WBC 5.7 6.1 6.5 5.7 6.9  HGB 8.5* 8.7* 8.7* 8.2* 8.3*  HCT 27.3* 27.5* 27.6* 26.3* 26.4*  MCV 101.5* 100.0 97.9 98.9 98.1  PLT 186 225 287 334 032    Basic Metabolic Panel: Recent Labs  Lab 09/03/21 0419 09/04/21 0031 09/05/21 0246 09/06/21 0224 09/07/21 0322  NA 132* 131* 132* 130* 133*  K 3.8 3.9 3.4* 4.0 4.0  CL 99 97* 96* 99 98  CO2 25 25 25  21* 26  GLUCOSE 206* 277* 198* 335* 311*  BUN 18 19 21  25* 19  CREATININE 0.75 0.74 0.80 0.84 0.79  CALCIUM 8.5* 8.5* 8.7* 8.7* 9.0  MG  --  1.9  --  1.9  --     GFR: Estimated Creatinine Clearance: 74.1 mL/min (by C-G formula based on SCr of 0.79 mg/dL).  Liver Function Tests: No results for input(s): AST, ALT, ALKPHOS, BILITOT, PROT, ALBUMIN in the last 168 hours.  CBG: Recent Labs  Lab  09/06/21 1208 09/06/21 1628 09/06/21 2125 09/07/21 0752 09/07/21 1233  GLUCAP 321* 319* 195* 246* 264*     No results found for this or any previous visit (from the past 240 hour(s)).       Radiology Studies: DG CHEST PORT 1 VIEW  Result Date: 09/07/2021 CLINICAL DATA:  Shortness of breath EXAM: PORTABLE CHEST 1 VIEW COMPARISON:  09/02/2021 FINDINGS: No significant change in rotated AP portable examination, with cardiomegaly status post median sternotomy and CABG. Unchanged, diffuse bilateral interstitial opacity. Left upper extremity PICC. IMPRESSION: No significant change in rotated AP portable  examination, with cardiomegaly status post median sternotomy and CABG. Unchanged, diffuse bilateral interstitial opacity, consistent with edema or infection. Electronically Signed   By: Delanna Ahmadi M.D.   On: 09/07/2021 14:09        Scheduled Meds:  (feeding supplement) PROSource Plus  30 mL Oral BID BM   apixaban  5 mg Oral BID   arformoterol  15 mcg Nebulization BID   vitamin C  500 mg Oral Daily   azaTHIOprine  125 mg Oral Daily   budesonide  2 mL Nebulization BID   calcitRIOL  0.25 mcg Oral Q3 days   Chlorhexidine Gluconate Cloth  6 each Topical Daily   clopidogrel  75 mg Oral Daily   diclofenac Sodium  2 g Topical TID   fluticasone  2 spray Each Nare Daily   [START ON 09/08/2021] furosemide  40 mg Intravenous Daily   furosemide  40 mg Intravenous Once   gabapentin  100 mg Oral QHS   guaiFENesin  600 mg Oral BID   hydrocerin   Topical BID   hydrocortisone  25 mg Rectal BID   insulin aspart  0-9 Units Subcutaneous TID WC   insulin aspart  7 Units Subcutaneous TID WC   insulin glargine-yfgn  20 Units Subcutaneous Daily   ipratropium-albuterol  3 mL Nebulization BID   isosorbide dinitrate  10 mg Oral BID   lamoTRIgine  25 mg Oral QHS   linaclotide  145 mcg Oral QAC breakfast   linagliptin  5 mg Oral Daily   loratadine  10 mg Oral Daily   magic mouthwash  5 mL Oral QID    metoprolol tartrate  25 mg Oral TID   montelukast  10 mg Oral QHS   multivitamin with minerals  1 tablet Oral Daily   nutrition supplement (JUVEN)  1 packet Oral BID BM   nystatin   Topical TID   pantoprazole  40 mg Oral Daily   predniSONE  5 mg Oral Q breakfast   Ensure Max Protein  11 oz Oral TID BM   rosuvastatin  20 mg Oral Daily   [START ON 09/08/2021] senna-docusate  2 tablet Oral QHS   sertraline  100 mg Oral Daily   sodium chloride flush  10-40 mL Intracatheter Q12H   sodium chloride flush  3 mL Intravenous Q12H   terbinafine   Topical BID   zinc sulfate  220 mg Oral Daily   Continuous Infusions:     LOS: 5 days   Time spent: 62mins Greater than 50% of this time was spent in counseling, explanation of diagnosis, planning of further management, and coordination of care.   Voice Recognition Viviann Spare dictation system was used to create this note, attempts have been made to correct errors. Please contact the author with questions and/or clarifications.   Florencia Reasons, MD PhD FACP [facility]  Available via Epic secure chat 7am-7pm for nonurgent issues Please page for urgent issues To page the attending provider between 7A-7P or the covering provider during after hours 7P-7A, please log into the web site [email or web address] and access using universal [place] password for that web site. If you do not have the password, please call the hospital operator.    09/07/2021, 3:02 PM

## 2021-09-08 ENCOUNTER — Encounter (HOSPITAL_COMMUNITY): Payer: Self-pay | Admitting: Interventional Cardiology

## 2021-09-08 DIAGNOSIS — I5043 Acute on chronic combined systolic (congestive) and diastolic (congestive) heart failure: Secondary | ICD-10-CM

## 2021-09-08 DIAGNOSIS — E669 Obesity, unspecified: Secondary | ICD-10-CM

## 2021-09-08 DIAGNOSIS — J438 Other emphysema: Secondary | ICD-10-CM

## 2021-09-08 DIAGNOSIS — E876 Hypokalemia: Secondary | ICD-10-CM

## 2021-09-08 DIAGNOSIS — R5381 Other malaise: Secondary | ICD-10-CM

## 2021-09-08 LAB — BASIC METABOLIC PANEL
Anion gap: 9 (ref 5–15)
BUN: 22 mg/dL (ref 8–23)
CO2: 27 mmol/L (ref 22–32)
Calcium: 8.8 mg/dL — ABNORMAL LOW (ref 8.9–10.3)
Chloride: 97 mmol/L — ABNORMAL LOW (ref 98–111)
Creatinine, Ser: 0.81 mg/dL (ref 0.44–1.00)
GFR, Estimated: >60 mL/min (ref >60)
Glucose, Bld: 182 mg/dL — ABNORMAL HIGH (ref 70–99)
Potassium: 3.3 mmol/L — ABNORMAL LOW (ref 3.5–5.1)
Sodium: 133 mmol/L — ABNORMAL LOW (ref 135–145)

## 2021-09-08 LAB — GLUCOSE, CAPILLARY
Glucose-Capillary: 209 mg/dL — ABNORMAL HIGH (ref 70–99)
Glucose-Capillary: 223 mg/dL — ABNORMAL HIGH (ref 70–99)
Glucose-Capillary: 231 mg/dL — ABNORMAL HIGH (ref 70–99)
Glucose-Capillary: 326 mg/dL — ABNORMAL HIGH (ref 70–99)
Glucose-Capillary: 285 mg/dL — ABNORMAL HIGH (ref 70–99)

## 2021-09-08 LAB — MAGNESIUM: Magnesium: 1.7 mg/dL (ref 1.7–2.4)

## 2021-09-08 MED ORDER — INSULIN ASPART 100 UNIT/ML IJ SOLN
0.0000 [IU] | Freq: Three times a day (TID) | INTRAMUSCULAR | Status: DC
  Administered 2021-09-09: 17:00:00 15 [IU] via SUBCUTANEOUS
  Administered 2021-09-09 (×2): 11 [IU] via SUBCUTANEOUS
  Administered 2021-09-10: 09:00:00 15 [IU] via SUBCUTANEOUS
  Administered 2021-09-10: 17:00:00 2 [IU] via SUBCUTANEOUS
  Administered 2021-09-10 – 2021-09-11 (×2): 11 [IU] via SUBCUTANEOUS
  Administered 2021-09-11: 3 [IU] via SUBCUTANEOUS

## 2021-09-08 MED ORDER — MAGNESIUM SULFATE 2 GM/50ML IV SOLN
2.0000 g | Freq: Once | INTRAVENOUS | Status: AC
  Administered 2021-09-08: 2 g via INTRAVENOUS
  Filled 2021-09-08: qty 50

## 2021-09-08 MED ORDER — INSULIN ASPART 100 UNIT/ML IJ SOLN
0.0000 [IU] | Freq: Every day | INTRAMUSCULAR | Status: DC
  Administered 2021-09-08: 3 [IU] via SUBCUTANEOUS

## 2021-09-08 MED ORDER — POTASSIUM CHLORIDE CRYS ER 20 MEQ PO TBCR
40.0000 meq | EXTENDED_RELEASE_TABLET | Freq: Once | ORAL | Status: AC
Start: 2021-09-08 — End: 2021-09-08
  Administered 2021-09-08: 40 meq via ORAL
  Filled 2021-09-08: qty 2

## 2021-09-08 NOTE — Progress Notes (Signed)
Inpatient Diabetes Program Recommendations  AACE/ADA: New Consensus Statement on Inpatient Glycemic Control (2015)  Target Ranges:  Prepandial:   less than 140 mg/dL      Peak postprandial:   less than 180 mg/dL (1-2 hours)      Critically ill patients:  140 - 180 mg/dL   Lab Results  Component Value Date   GLUCAP 231 (H) 09/08/2021   HGBA1C 7.9 (H) 08/11/2021    Review of Glycemic Control  Latest Reference Range & Units 09/07/21 16:17 09/08/21 01:42 09/08/21 08:07 09/08/21 11:00  Glucose-Capillary 70 - 99 mg/dL 228 (H) 209 (H) 223 (H) 231 (H)  (H): Data is abnormally high Diabetes history: DM 2 Outpatient Diabetes medications: Amaryl 4 mg Daily, Novolog 4-10 units tid, Basaglar 20 units Daily, Metformin 500 mg bid Current orders for Inpatient glycemic control:  Tradjenta 5 mg Daily Semglee 20 units Daily Novolog 5 units tid meal coverage Novolog 0-9 units tid   Ensure max tid between meals A1c 7.9% on 08/11/21   Inpatient Diabetes Program Recommendations:    Consider increasing Semglee to 24 units QD if patient to remain inpatient.   Thanks, Bronson Curb, MSN, RNC-OB Diabetes Coordinator [phone] (8a-5p)

## 2021-09-08 NOTE — TOC Initial Note (Addendum)
Transition of Care [facility]) - Initial/Assessment Note    Patient Details  Name: Katherine [name] MRN: [id number] Date of Birth: Jul 16, 1956  Transition of Care [facility]) CM/SW Contact:    Bethena Roys, RN Phone Number: 09/08/2021, 4:05 PM  Clinical Narrative: Risk for readmission assessment completed. Case Manager spoke with the patient regarding disposition needs. Patient states she lives with her brother Katherine [name] [phone] and goddaughter. Patient states she has durable medical equipment rollator, oxygen via [employer], and bedside commode in the home. Patient states her brother will be able to assist with transportation needs initially to appointments. Case Manager provided choice to the patient and she chose [facility]-for PT,OT, Aide- will need orders and F2F once stable. Patient states family will bring 33 for travel home. Case Manager will continue to follow for additional transition of care needs as she progresses.           Expected Discharge Plan: [place] Barriers to Discharge: Continued Medical Work up   Patient Goals and CMS Choice Patient states their goals for this hospitalization and ongoing recovery are:: Patient wants to return home once stable. CMS Medicare.gov Compare Post Acute Care list provided to:: Patient Choice offered to / list presented to : Patient  Expected Discharge Plan and Services Expected Discharge Plan: [place] In-house Referral: NA Discharge Planning Services: CM Consult Post Acute Care Choice: [place] arrangements for the past 2 months: Single Family Home                 DME Arranged: N/A DME Agency: NA       HH Arranged: PT, OT, Nurse's Aide [place] Agency: [place] ([facility]) Date HH Agency Contacted: 09/08/21 Time HH Agency Contacted: [id number] Representative spoke with at [place]: Corene Cornea  Prior Living Arrangements/Services Living arrangements for the past 2 months:  [place] Lives with:: Relatives (Patient states she lives with her brother and goddaughter.) Patient language and need for interpreter reviewed:: Yes Do you feel safe going back to the place where you live?: Yes      Need for Family Participation in Patient Care: Yes (Comment) Care giver support system in place?: Yes (comment) Current home services: DME (Patient has rollator, oxygen via [employer], and bedside commode.) Criminal Activity/Legal Involvement Pertinent to Current Situation/Hospitalization: No - Comment as needed  Activities of Daily Living Home Assistive Devices/Equipment: Oxygen ADL Screening (condition at time of admission) Patient's cognitive ability adequate to safely complete daily activities?: Yes Is the patient deaf or have difficulty hearing?: No Does the patient have difficulty seeing, even when wearing glasses/contacts?: No Does the patient have difficulty concentrating, remembering, or making decisions?: No Patient able to express need for assistance with ADLs?: Yes Does the patient have difficulty dressing or bathing?: No Independently performs ADLs?: Yes (appropriate for developmental age) Does the patient have difficulty walking or climbing stairs?: Yes Weakness of Legs: Both Weakness of Arms/Hands: Both  Permission Sought/Granted Permission sought to share information with : Family Supports, Customer service manager, Case Optician, dispensing granted to share information with : Yes, Verbal Permission Granted     Permission granted to share info w AGENCY: [facility] [employer].        Emotional Assessment Appearance:: Appears stated age Attitude/Demeanor/Rapport: Engaged Affect (typically observed): Appropriate Orientation: : Oriented to Self, Oriented to Place, Oriented to  Time, Oriented to Situation Alcohol / Substance Use: Not Applicable Psych Involvement: No (comment)  Admission diagnosis:  Rest pain  of lower extremity due to atherosclerosis  [facility]) [I70.229] Patient Active Problem List   Diagnosis Date Noted   Rest pain of lower extremity due to atherosclerosis ([place]) 09/02/2021   Longstanding persistent atrial fibrillation ([place])    Pneumonia, viral 08/25/2021   Post covid-19 condition, unspecified 08/25/2021   MRSA pneumonia ([place]) 08/09/2021   Positive D dimer 08/09/2021   Chronic diastolic CHF (congestive heart failure) ([place]) 08/06/2021   COVID-19 virus infection 08/05/2021   Obesity (BMI 30-39.9) 08/05/2021   Acute on chronic respiratory failure with hypoxia ([place]) 08/04/2021   Pressure injury of skin 08/04/2021   Polymorphic ventricular tachycardia    Bradycardia    Acute encephalopathy    COPD (chronic obstructive pulmonary disease) ([place])    AKI (acute kidney injury) ([place])    History of renal transplant    Uncontrolled type 2 diabetes mellitus with hyperglycemia, 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    Nausea & vomiting 04/19/2021   Acute kidney injury ([place]) 04/19/2021   Viral syndrome 03/21/2021   Pure hypercholesterolemia    Coronary stent restenosis due to scar tissue    Unstable angina (HCC) 02/19/2021   Acute CHF (congestive heart failure) ([place]) 01/29/2021   Non-ST elevation (NSTEMI) myocardial infarction [facility])    Chronic respiratory failure with hypoxia ([place]) 01/01/2021   Flu vaccine need 08/13/2020   Need for pneumococcal vaccination 08/13/2020   Visit for screening mammogram 08/13/2020   Candidal skin infection 08/13/2020   GAD (generalized anxiety disorder) 08/13/2020   Chronic bronchitis, mucopurulent ([place]) 08/24/2019   Current chronic use of systemic steroids 07/23/2019   OSA (obstructive sleep apnea)    Severe episode of recurrent major depressive disorder, with psychotic features ([place]) 02/16/2019   Obstructive chronic bronchitis without exacerbation ([place]) 11/03/2018   Long term (current) use of antithrombotics/antiplatelets 04/28/2018   Squamous cell carcinoma of skin of  right thumb 02/02/2018   Renal transplant, status post 01/28/2018   LBBB (left bundle branch block) 10/07/2017   Primary osteoarthritis of right knee 08/18/2017   Shortness of breath 06/18/2017   Ovarian cyst 05/18/2017   Pelvic mass in female 03/11/2017   Mild aortic stenosis by prior echocardiogram 01/28/2017   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   Deficiency anemia 10/14/2016   Normocytic anemia 10/13/2016   Fibromyalgia 03/30/2016   Other spondylosis with radiculopathy, lumbar region 03/30/2016   Type 2 diabetes mellitus with peripheral neuropathy ([place]) 03/30/2016   Paroxysmal atrial fibrillation ([place]); CHA2DS2VASc score F, HTN, CAD, CVA = 5 06/28/2014   Debility 06/28/2014   Fluttering heart 06/21/2014   CAD S/P percutaneous coronary angioplasty - PCI x 5 to SVG-D1 09/11/2013    Class: Diagnosis of   Essential hypertension    NSTEMI (non-ST elevated myocardial infarction) ([place]) 08/23/2013   Peripheral arterial disease ([place]) 08/17/2013   CAD (coronary artery disease) of bypass graft 10/08/2012   Stenosis of right carotid artery without infarction 10/08/2012   Dyslipidemia, goal LDL below 70 10/08/2012    Class: Diagnosis of   Mitral annular calcification 10/08/2012   Hypomagnesemia 06/13/2012   Renal transplant disorder 06/12/2012   Chronic allergic rhinitis 04/29/2011   Extrinsic asthma 09/09/2007   GERD 09/09/2007   COUGH, CHRONIC 09/09/2007   Morbid obesity - s/p Lap Band 9/'05 05/07/2004    Class: Diagnosis of   Hx of CABG 09/07/1993    Class: History of   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 07/1993   Coronary artery disease involving native coronary artery of  native heart with angina pectoris [facility]) 07/1993   ESRD (end stage renal disease) ([place]) 1991   PCP:  [name], MD Pharmacy:   [facility] [address] [place] [address] Phone: [phone] Fax: [phone]     Social Determinants of  Health (SDOH) Interventions    Readmission Risk Interventions Readmission Risk Prevention Plan 09/08/2021 05/14/2021 02/21/2021  Transportation Screening Complete Complete Complete  HRI or [place] for [place] - - -  Medication Review Press photographer) Complete Complete Complete  PCP or Specialist appointment within 3-5 days of discharge Complete Complete Complete  HRI or Home Care Consult Complete Complete Complete  SW Recovery Care/Counseling Consult Complete Complete Complete  Palliative Care Screening Not Applicable Not Applicable Not Applicable  [place] Not Applicable Not Applicable Not Applicable  Some recent data might be hidden

## 2021-09-08 NOTE — Progress Notes (Addendum)
The patient has been seen in conjunction with Vin Bhagat, PA-C. All aspects of care have been considered and discussed. The patient has been personally interviewed, examined, and all clinical data has been reviewed.  Technically unsuccessful coronary angiography via right radial. Complain of mild chest discomfort at this time. Recommend ambulation and discharge later today. Follow-up with Dr. Ellyn Hack as OP.  Progress Note  Patient Name: Katherine [name] Date of Encounter: 09/08/2021  [facility] Cardiologist: [name], MD   Subjective   Breathing stable. Mild chest tightness.   Inpatient Medications    Scheduled Meds:  (feeding supplement) PROSource Plus  30 mL Oral BID BM   apixaban  5 mg Oral BID   arformoterol  15 mcg Nebulization BID   vitamin C  500 mg Oral Daily   azaTHIOprine  125 mg Oral Daily   budesonide  2 mL Nebulization BID   calcitRIOL  0.25 mcg Oral Q3 days   Chlorhexidine Gluconate Cloth  6 each Topical Daily   clopidogrel  75 mg Oral Daily   diclofenac Sodium  2 g Topical TID   fluticasone  2 spray Each Nare Daily   furosemide  40 mg Intravenous Daily   gabapentin  100 mg Oral QHS   guaiFENesin  600 mg Oral BID   hydrocerin   Topical BID   hydrocortisone  25 mg Rectal BID   insulin aspart  0-9 Units Subcutaneous TID WC   insulin aspart  7 Units Subcutaneous TID WC   insulin glargine-yfgn  20 Units Subcutaneous Daily   ipratropium-albuterol  3 mL Nebulization BID   isosorbide dinitrate  10 mg Oral BID   lamoTRIgine  25 mg Oral QHS   linaclotide  145 mcg Oral QAC breakfast   linagliptin  5 mg Oral Daily   loratadine  10 mg Oral Daily   magic mouthwash  5 mL Oral QID   metoprolol tartrate  25 mg Oral TID   montelukast  10 mg Oral QHS   multivitamin with minerals  1 tablet Oral Daily   nutrition supplement (JUVEN)  1 packet Oral BID BM   nystatin   Topical TID   pantoprazole  40 mg Oral Daily   predniSONE  5 mg Oral Q breakfast   Ensure  Max Protein  11 oz Oral TID BM   rosuvastatin  20 mg Oral Daily   senna-docusate  2 tablet Oral QHS   sertraline  100 mg Oral Daily   sodium chloride flush  10-40 mL Intracatheter Q12H   sodium chloride flush  3 mL Intravenous Q12H   terbinafine   Topical BID   zinc sulfate  220 mg Oral Daily   Continuous Infusions:  magnesium sulfate bolus IVPB     PRN Meds: acetaminophen, alum & mag hydroxide-simeth, bisacodyl, cyclobenzaprine, diphenhydrAMINE, guaiFENesin-dextromethorphan, hydrALAZINE, ipratropium-albuterol, LORazepam, melatonin, menthol-cetylpyridinium, morphine injection, nitroGLYCERIN, oxyCODONE, polyethylene glycol, prochlorperazine **OR** prochlorperazine **OR** prochlorperazine, sodium chloride, sodium chloride flush, sodium phosphate   Vital Signs    Vitals:   09/07/21 2016 09/07/21 2126 09/08/21 0455 09/08/21 0913  BP: 133/62 119/66 (!) 114/55 114/89  Pulse: 87 86 84 91  Resp: 18  19 (!) 22  Temp: 98.2 F (36.8 C)  98 F (36.7 C)   TempSrc: Oral  Oral   SpO2: 98%  93% 93%  Weight:   89.4 kg   Height:        Intake/Output Summary (Last 24 hours) at 09/08/2021 6294 Last data filed at 09/07/2021 2044 Gross  per 24 hour  Intake 590 ml  Output 3150 ml  Net -2560 ml   Last 3 Weights 09/08/2021 09/07/2021 09/06/2021  Weight (lbs) 197 lb 1.5 oz 206 lb 2.1 oz 207 lb 3.7 oz  Weight (kg) 89.4 kg 93.5 kg 94 kg      Telemetry    Atrial fibrillation 80-100s - Personally Reviewed  ECG    N/A  Physical Exam   GEN: obese female in no acute distress.   Neck: + JVD Cardiac: Irregular, no murmurs, rubs, or gallops.  Respiratory: faint rhonchi GI: Soft, nontender, non-distended  MS: No edema; No deformity. Neuro:  Nonfocal  Psych: Normal affect   Labs    High Sensitivity Troponin:   Recent Labs  Lab 08/14/21 1845 08/19/21 1003 08/19/21 1135 09/01/21 1826 09/02/21 0447  TROPONINIHS 351* 93* 76* 52* 66*     Chemistry Recent Labs  Lab 09/04/21 0031  09/05/21 0246 09/06/21 0224 09/07/21 0322 09/08/21 0340  NA 131*   < > 130* 133* 133*  K 3.9   < > 4.0 4.0 3.3*  CL 97*   < > 99 98 97*  CO2 25   < > 21* 26 27  GLUCOSE 277*   < > 335* 311* 182*  BUN 19   < > 25* 19 22  CREATININE 0.74   < > 0.84 0.79 0.81  CALCIUM 8.5*   < > 8.7* 9.0 8.8*  MG 1.9  --  1.9  --  1.7  GFRNONAA >60   < > >60 >60 >60  ANIONGAP 9   < > 10 9 9    < > = values in this interval not displayed.    Lipids No results for input(s): CHOL, TRIG, HDL, LABVLDL, LDLCALC, CHOLHDL in the last 168 hours.  Hematology Recent Labs  Lab 09/05/21 0246 09/06/21 0224 09/07/21 0322  WBC 6.5 5.7 6.9  RBC 2.82* 2.66* 2.69*  HGB 8.7* 8.2* 8.3*  HCT 27.6* 26.3* 26.4*  MCV 97.9 98.9 98.1  MCH 30.9 30.8 30.9  MCHC 31.5 31.2 31.4  RDW 16.6* 17.0* 16.7*  PLT 287 334 386   Thyroid No results for input(s): TSH, FREET4 in the last 168 hours.  BNP Recent Labs  Lab 09/02/21 0447  BNP 539.6*    DDimer No results for input(s): DDIMER in the last 168 hours.   Radiology    DG CHEST PORT 1 VIEW  Result Date: 09/07/2021 CLINICAL DATA:  Shortness of breath EXAM: PORTABLE CHEST 1 VIEW COMPARISON:  09/02/2021 FINDINGS: No significant change in rotated AP portable examination, with cardiomegaly status post median sternotomy and CABG. Unchanged, diffuse bilateral interstitial opacity. Left upper extremity PICC. IMPRESSION: No significant change in rotated AP portable examination, with cardiomegaly status post median sternotomy and CABG. Unchanged, diffuse bilateral interstitial opacity, consistent with edema or infection. Electronically Signed   By: Delanna Ahmadi M.D.   On: 09/07/2021 14:09    Cardiac Studies   LEFT HEART CATH AND CORS/GRAFTS ANGIOGRAPHY  09/05/21   Conclusion  Unsuccessful attempt at Left Heart Catheterization with Coronary and GraftAngiography via radial access.  Extensive radial artery spasm, unable to pass catheter beyond elbow. With wire removal, there was  evidence of forearm hematoma being held with manual pressure. Only other potential option for angiography would be to consider ulnar access on the right arm versus brachial arterial access at a later date.   [name], MD   Echo 08/09/2021  1. Left ventricular ejection fraction, by estimation, is 45 to  50%. The  left ventricle has mildly decreased function. The left ventricle  demonstrates regional wall motion abnormalities (see scoring  diagram/findings for description). There is mild left  ventricular hypertrophy. Left ventricular diastolic function could not be  evaluated.   2. Right ventricular systolic function is normal. The right ventricular  size is normal.   3. The mitral valve is grossly normal. Trivial mitral valve  regurgitation.   4. The aortic valve has an indeterminant number of cusps. There is  moderate calcification of the aortic valve. Aortic valve regurgitation is  not visualized. Aortic valve sclerosis/calcification is present, without  any evidence of aortic stenosis.   Comparison(s): Prior images reviewed side by side. Wall motion more  similar to last study with Definity contrast on 07/12/2020, although LVEF  somewhat better Patient Profile     66 y.o. female CAD s/p CABG with multiple PCIs since then, carotid artery stenosis (mild on L, moderate on R 2017), chornic diastolic CHF, DM, HTN, HLD, PAF, ESRD s/p renal transplant, GERD, mild AS, OSA, COPD with chronic respiratory failure on 3L home O2, RA, anemia, depression, fibromyalgia, GIB, migraines, right eye stroke, torsades/QT prolongation (in setting of electrolyte disturbance, Tikosyn, Zoloft, phenergan), morbid obesity s/p prior lap band, PAD, recurrent boils, PAD (occlusion of RCFA and LCFA per notes) admitted from CIR for angina.   Assessment & Plan    CAD with known complex dx - Cath 08/20/21 and 09/05/21. Unsuccessful attempt Friday complicated with hematoma. Per cath report "Only other potential  option for angiography would be to consider ulnar access on the right arm versus brachial arterial access at a later date".  -Continue Plavix, Isordil, Crestor and metoprolol 25mg  TID   2. Chronic diastolic CHF - Had dyspnea yesterday. Repeat chest x-ray with edema. Continue IV lasix.  - Net I & O negative 6.6L. weight 203>>197lb  - continue BB  3. Hypokalemia - On supplement   4. Hx of renal transplant -Scr stable  5. Atrial fibrillation/Flutter - Rate relatively stable - Continue BB and Eliquis     For questions or updates, please contact [facility] Please consult [email or web address] for contact info under        SignedLeanor Kail, PA  09/08/2021, 9:27 AM

## 2021-09-08 NOTE — Progress Notes (Signed)
PT Cancellation Note  Patient Details Name: Katherine [name] MRN: [id number] DOB: 1955-11-27   Cancelled Treatment:    Reason Eval/Treat Not Completed: (P) Other (comment) (experiencing nausea and vomiting) PT will follow back this afternoon as able.   Fredis Malkiewicz B. [name] PT, DPT Acute Rehabilitation Services Pager [phone] Office [phone]    [place] 09/08/2021, 12:10 PM

## 2021-09-08 NOTE — Progress Notes (Signed)
PROGRESS NOTE   Katherine [name]  FUX:[id number]    DOB: 1956-01-07    DOA: 09/02/2021  PCP: [name], MD   I have briefly reviewed patients previous medical records in [facility].    Brief Narrative:  66 year old female with medical history significant for CAD s/p CABG and PCI, bilateral carotid artery stenosis, PAD, PAF on Eliquis, COPD, chronic respiratory failure with hypoxia on home oxygen 3 L per minute, IDDM2, s/p renal transplant on immunosuppressant,  urolithiasis, anemia, history of GI bleed,fibromyalgia/chronic pain, recent hospitalization from 07/25/2021-08/25/2021, for COVID with MRSA pneumonia. 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  Since the cardiac cath was attempted patient reports that she has had right leg pain at rest.  She also reported chest pain with activity.  Seen by vascular surgery and cardiology and transferred from CIR to [facility] for further evaluation and management.  She was admitted under [place].  [name] Kitchen    Assessment & Plan:  Active Problems:   Dyslipidemia, goal LDL below 70   CAD S/P percutaneous coronary angioplasty - PCI x 5 to SVG-D1   Debility   Chronic respiratory failure with hypoxia (HCC)   COPD (chronic obstructive pulmonary disease) (HCC)   History of renal transplant   Obesity (BMI 30-39.9)   Chronic diastolic CHF (congestive heart failure) (HCC)   Rest pain of lower extremity due to atherosclerosis (HCC)   CAD, s/p CABG, with known complex disease/angina: Cardiology on board and their input appreciated.  Recent NSTEMI during last hospitalization.  Had cath 08/20/2021 and 09/05/2021.  Technically unsuccessful coronary angiography via right radial on 0/25, complicated by hematoma.  Continue Plavix, Isordil, Crestor and metoprolol.  Outpatient follow-up with Dr.  Ellyn Hack  Right>Left foot pain/PAD: As per vascular surgery input 1/19, known to have occluded bilateral common iliac arteries and common femoral arteries.  They recommended options being continued pain control versus bilateral common femoral endarterectomies with possible retrograde iliac stenting and possible axillary to bifemoral bypass.  However per cardiology, she would be high risk for any open surgery and since she was not in any pain, no plan for any further procedures.  Vascular surgery signed off 1/19.  Continue Plavix and Crestor.  Acute on chronic combined diastolic and systolic CHF: Last LVEF 42-70% in December 2022 treating with IV Lasix 40 mg daily.  Continue Isordil and metoprolol.  -6.8 L thus far.  Weight down from 203 pounds on admission to 197 pounds.  COPD/chronic respiratory failure with hypoxia on 2 L/min at baseline: No clinical bronchospasm.  On scheduled nebs twice daily at home.  Stable.  Type II DM/IDDM: Holding metformin.  Uncontrolled on current insulin regimen.  Adjust insulins and monitor.  Continue Tradjenta.  Replace and follow.  Magnesium 1.7.  Hyponatremia: May be hypervolemic hyponatremia versus SIADH.  Stable in the low 130s.  Follow BMP.  History of renal transplant: Creatinine normal.  Continue immunosuppressants.  Followed by Dr. Johnney Ou at [facility]  Chronic atrial fibrillation/flutter: Controlled ventricular rate on telemetry.  Continue metoprolol and Eliquis.  Anemia: Stable in the 8 g range.  Body mass index is 37.24 kg/m./Obesity  Nutritional Status        Pressure Ulcer:     DVT prophylaxis:   Eliquis   Code Status: DNR:  Family Communication: None at bedside Disposition:  Status  is: Inpatient  Remains inpatient appropriate because: IV Lasix for CHF         Consultants:   Vascular surgery Cardiology  Procedures:   Unsuccessful cardiac catheter on 1/20  Antimicrobials:      Subjective:  Seen this  morning.  RN at bedside.  Reports "sinus congestion", intermittently spitting up minimal blood-showed [place] back on tissue paper.  Reports allergies and takes Zyrtec at home.  Asking for antibiotics for sinuses although denies any productive cough.  No chest pain or dyspnea during my visit  Objective:   Vitals:   09/08/21 0913 09/08/21 0932 09/08/21 1530 09/08/21 1645  BP: 114/89  102/71 (!) 111/51  Pulse: 91 (!) 108 90 71  Resp: (!) 22 18 18 19   Temp:   98.5 F (36.9 C) 98.4 F (36.9 C)  TempSrc:   Oral Oral  SpO2: 93% 94% 97% 94%  Weight:      Height:        General exam: Middle-age female, moderately built and obese sitting up comfortably in bed without distress. ENT: Throat clear without acute findings.  No sinus tenderness appreciated. Respiratory system: Slightly diminished breath sounds in the bases but otherwise clear to auscultation. Cardiovascular system: S1 & S2 heard, RRR. No JVD, murmurs, rubs, gallops or clicks. No pedal edema.  Telemetry personally reviewed: A. fib with controlled ventricular rate Gastrointestinal system: Abdomen is nondistended, soft and nontender. No organomegaly or masses felt. Normal bowel sounds heard. Central nervous system: Alert and oriented. No focal neurological deficits. Extremities: Symmetric 5 x 5 power. Skin: No rashes, lesions or ulcers Psychiatry: Judgement and insight appear normal. Mood & affect appropriate.     Data Reviewed:   I have personally reviewed following labs and imaging studies   CBC: Recent Labs  Lab 09/05/21 0246 09/06/21 0224 09/07/21 0322  WBC 6.5 5.7 6.9  HGB 8.7* 8.2* 8.3*  HCT 27.6* 26.3* 26.4*  MCV 97.9 98.9 98.1  PLT 287 334 782    Basic Metabolic Panel: Recent Labs  Lab 09/04/21 0031 09/05/21 0246 09/06/21 0224 09/07/21 0322 09/08/21 0340  NA 131* 132* 130* 133* 133*  K 3.9 3.4* 4.0 4.0 3.3*  CL 97* 96* 99 98 97*  CO2 25 25 21* 26 27  GLUCOSE 277* 198* 335* 311* 182*  BUN 19 21 25* 19 22   CREATININE 0.74 0.80 0.84 0.79 0.81  CALCIUM 8.5* 8.7* 8.7* 9.0 8.8*  MG 1.9  --  1.9  --  1.7    Liver Function Tests: No results for input(s): AST, ALT, ALKPHOS, BILITOT, PROT, ALBUMIN in the last 168 hours.  CBG: Recent Labs  Lab 09/08/21 0807 09/08/21 1100 09/08/21 1642  GLUCAP 223* 231* 326*    Microbiology Studies:  No results found for this or any previous visit (from the past 240 hour(s)).  Radiology Studies:  DG CHEST PORT 1 VIEW  Result Date: 09/07/2021 CLINICAL DATA:  Shortness of breath EXAM: PORTABLE CHEST 1 VIEW COMPARISON:  09/02/2021 FINDINGS: No significant change in rotated AP portable examination, with cardiomegaly status post median sternotomy and CABG. Unchanged, diffuse bilateral interstitial opacity. Left upper extremity PICC. IMPRESSION: No significant change in rotated AP portable examination, with cardiomegaly status post median sternotomy and CABG. Unchanged, diffuse bilateral interstitial opacity, consistent with edema or infection. Electronically Signed   By: Delanna Ahmadi M.D.   On: 09/07/2021 14:09    Scheduled Meds:    (feeding supplement) PROSource Plus  30 mL Oral BID BM   apixaban  5 mg Oral BID   arformoterol  15 mcg Nebulization BID   vitamin C  500 mg Oral Daily   azaTHIOprine  125 mg Oral Daily   budesonide  2 mL Nebulization BID   calcitRIOL  0.25 mcg Oral Q3 days   Chlorhexidine Gluconate Cloth  6 each Topical Daily   clopidogrel  75 mg Oral Daily   diclofenac Sodium  2 g Topical TID   fluticasone  2 spray Each Nare Daily   furosemide  40 mg Intravenous Daily   gabapentin  100 mg Oral QHS   guaiFENesin  600 mg Oral BID   hydrocerin   Topical BID   hydrocortisone  25 mg Rectal BID   insulin aspart  0-9 Units Subcutaneous TID WC   insulin aspart  7 Units Subcutaneous TID WC   insulin glargine-yfgn  20 Units Subcutaneous Daily   ipratropium-albuterol  3 mL Nebulization BID   isosorbide dinitrate  10 mg Oral BID   lamoTRIgine  25  mg Oral QHS   linaclotide  145 mcg Oral QAC breakfast   linagliptin  5 mg Oral Daily   loratadine  10 mg Oral Daily   magic mouthwash  5 mL Oral QID   metoprolol tartrate  25 mg Oral TID   montelukast  10 mg Oral QHS   multivitamin with minerals  1 tablet Oral Daily   nutrition supplement (JUVEN)  1 packet Oral BID BM   nystatin   Topical TID   pantoprazole  40 mg Oral Daily   predniSONE  5 mg Oral Q breakfast   Ensure Max Protein  11 oz Oral TID BM   rosuvastatin  20 mg Oral Daily   senna-docusate  2 tablet Oral QHS   sertraline  100 mg Oral Daily   sodium chloride flush  10-40 mL Intracatheter Q12H   sodium chloride flush  3 mL Intravenous Q12H   terbinafine   Topical BID   zinc sulfate  220 mg Oral Daily    Continuous Infusions:     LOS: 6 days     Vernell Leep, MD,  FACP, [facility], [facility], [facility] (Care Management Physician Certified) [place]  To contact the attending provider between 7A-7P or the covering provider during after hours 7P-7A, please log into the web site [email or web address] and access using universal [place] password for that web site. If you do not have the password, please call the hospital operator.  09/08/2021, 5:16 PM

## 2021-09-08 NOTE — Progress Notes (Signed)
Physical Therapy Treatment Patient Details Name: Katherine [name] MRN: [id number] DOB: Apr 17, 1956 Today's Date: 09/08/2021   History of Present Illness Pt is a 66 y/o F admitted 07/25/21-08/25/21 for COVID wtih MRSA, PNA, hospital course complicated by NSTEMI. Discharged to AIR where she was progressing mobility however with increased activity experiencing increased R LE claudical pain as well as chest pain and increased HR/A-fib. Pt returned to hospital 09/02/21 for treatment of PAD R LE>LE and reattempt heart catheterization 09/05/21. Failed cardiac cath 1/20. PMH includes CAD s/p CABG and PCI, COPD at home on 3L O2 via [place], DM2, s/p renal transplant, anxiety, depression, anemia, fibromyalgia, and GERD.    PT Comments    With increased encouragement, pt agrees to sit EoB and perform some LE exercise. Pt reports she does not want to get up due to increased Lasix. Pt reports she will be fine at home and has good support. D/c plan is appropriate.     Recommendations for follow up therapy are one component of a multi-disciplinary discharge planning process, led by the attending physician.  Recommendations may be updated based on patient status, additional functional criteria and insurance authorization.  Follow Up Recommendations  Home health PT (may require PTAR home due to 5 steps to enter, pending progression)     Assistance Recommended at Discharge Frequent or constant Supervision/Assistance  Patient can return home with the following A lot of help with bathing/dressing/bathroom;Assistance with cooking/housework;Assistance with feeding;Assist for transportation;Help with stairs or ramp for entrance;A little help with walking and/or transfers   Equipment Recommendations  None recommended by PT       Precautions / Restrictions Precautions Precautions: Fall Precaution Comments: monitor sats/chest pain/anxiety Restrictions Weight Bearing Restrictions: No     Mobility  Bed Mobility Overal  bed mobility: Needs Assistance Bed Mobility: Supine to Sit, Sit to Supine     Supine to sit: Supervision, HOB elevated Sit to supine: Supervision, HOB elevated   General bed mobility comments: only agreeable to sit EoB due to Lasik                                       Balance Overall balance assessment: Needs assistance Sitting-balance support: Feet supported Sitting balance-Leahy Scale: Good Sitting balance - Comments: sits unsupported EOB with supervision.                                    Cognition Arousal/Alertness: Awake/alert Behavior During Therapy: Anxious, WFL for tasks assessed/performed Overall Cognitive Status: Within Functional Limits for tasks assessed                                 General Comments: Pt initially resistive to mobility due to her lungs not working great and her legs almost giving out under her yesterday per pt. Provided active listening then asked pt to allow PT to educate her on research in regards to muscle atrophy/deconditioning with staying in bed/not mobilizing etc. Pt was agreeable and listened to PT and then allowed PT to progress mobility to tolerance today.        Exercises General Exercises - Lower Extremity Ankle Circles/Pumps: AROM, Both, 10 reps Long Arc Quad: AROM, Both, 10 reps Straight Leg Raises: AROM, Both, 10 reps, Seated    General Comments  VSS on RA      Pertinent Vitals/Pain Pain Assessment Pain Assessment: Faces Faces Pain Scale: Hurts a little bit Pain Location: generalized Pain Descriptors / Indicators: Discomfort, Operative site guarding Pain Intervention(s): Limited activity within patient's tolerance, Monitored during session, Repositioned     PT Goals (current goals can now be found in the care plan section) Acute Rehab PT Goals Patient Stated Goal: to go home PT Goal Formulation: With patient Time For Goal Achievement: 09/17/21 Potential to Achieve Goals:  Good Progress towards PT goals: Progressing toward goals    Frequency    Min 3X/week      PT Plan Current plan remains appropriate       AM-PAC PT "6 Clicks" Mobility   Outcome Measure  Help needed turning from your back to your side while in a flat bed without using bedrails?: A Little Help needed moving from lying on your back to sitting on the side of a flat bed without using bedrails?: A Little Help needed moving to and from a bed to a chair (including a wheelchair)?: A Little Help needed standing up from a chair using your arms (e.g., wheelchair or bedside chair)?: A Little Help needed to walk in hospital room?: A Little Help needed climbing 3-5 steps with a railing? : A Lot 6 Click Score: 17    End of Session Equipment Utilized During Treatment: Gait belt;Oxygen Activity Tolerance: Patient tolerated treatment well Patient left: in bed;with call bell/phone within reach;with bed alarm set;with nursing/sitter in room Nurse Communication: Mobility status PT Visit Diagnosis: Muscle weakness (generalized) (M62.81);Difficulty in walking, not elsewhere classified (R26.2);Unsteadiness on feet (R26.81);Other abnormalities of gait and mobility (R26.89)     Time: 8938-1017 PT Time Calculation (min) (ACUTE ONLY): 34 min  Charges:  $Therapeutic Exercise: 8-22 mins                     Nashid Pellum B. [name] PT, DPT Acute Rehabilitation Services Pager [phone] Office [phone]    [facility] 09/08/2021, 4:43 PM

## 2021-09-08 NOTE — Care Management Important Message (Signed)
Important Message  Patient Details  Name: Katherine [name] MRN: [id number] Date of Birth: 03-12-1956   Medicare Important Message Given:  Yes     [name] 09/08/2021, 10:24 AM

## 2021-09-09 DIAGNOSIS — E785 Hyperlipidemia, unspecified: Secondary | ICD-10-CM

## 2021-09-09 DIAGNOSIS — E11649 Type 2 diabetes mellitus with hypoglycemia without coma: Secondary | ICD-10-CM

## 2021-09-09 LAB — GLUCOSE, CAPILLARY
Glucose-Capillary: 328 mg/dL — ABNORMAL HIGH (ref 70–99)
Glucose-Capillary: 337 mg/dL — ABNORMAL HIGH (ref 70–99)
Glucose-Capillary: 399 mg/dL — ABNORMAL HIGH (ref 70–99)
Glucose-Capillary: 142 mg/dL — ABNORMAL HIGH (ref 70–99)

## 2021-09-09 LAB — BASIC METABOLIC PANEL
Anion gap: 11 (ref 5–15)
BUN: 24 mg/dL — ABNORMAL HIGH (ref 8–23)
CO2: 26 mmol/L (ref 22–32)
Calcium: 9 mg/dL (ref 8.9–10.3)
Chloride: 97 mmol/L — ABNORMAL LOW (ref 98–111)
Creatinine, Ser: 0.85 mg/dL (ref 0.44–1.00)
GFR, Estimated: >60 mL/min (ref >60)
Glucose, Bld: 332 mg/dL — ABNORMAL HIGH (ref 70–99)
Potassium: 4.2 mmol/L (ref 3.5–5.1)
Sodium: 134 mmol/L — ABNORMAL LOW (ref 135–145)

## 2021-09-09 LAB — CBC
HCT: 29 % — ABNORMAL LOW (ref 36.0–46.0)
Hemoglobin: 9.1 g/dL — ABNORMAL LOW (ref 12.0–15.0)
MCH: 30.5 pg (ref 26.0–34.0)
MCHC: 31.4 g/dL (ref 30.0–36.0)
MCV: 97.3 fL (ref 80.0–100.0)
Platelets: 481 10*3/uL — ABNORMAL HIGH (ref 150–400)
RBC: 2.98 MIL/uL — ABNORMAL LOW (ref 3.87–5.11)
RDW: 17 % — ABNORMAL HIGH (ref 11.5–15.5)
WBC: 9.1 10*3/uL (ref 4.0–10.5)
nRBC: 0.4 % — ABNORMAL HIGH (ref 0.0–0.2)

## 2021-09-09 LAB — TROPONIN I (HIGH SENSITIVITY): Troponin I (High Sensitivity): 75 ng/L — ABNORMAL HIGH (ref <18)

## 2021-09-09 MED ORDER — INSULIN GLARGINE-YFGN 100 UNIT/ML ~~LOC~~ SOLN
28.0000 [IU] | Freq: Every day | SUBCUTANEOUS | Status: DC
  Administered 2021-09-10: 09:00:00 28 [IU] via SUBCUTANEOUS
  Filled 2021-09-09: qty 0.28

## 2021-09-09 MED ORDER — INSULIN GLARGINE-YFGN 100 UNIT/ML ~~LOC~~ SOLN
23.0000 [IU] | Freq: Every day | SUBCUTANEOUS | Status: DC
  Administered 2021-09-09: 10:00:00 23 [IU] via SUBCUTANEOUS
  Filled 2021-09-09: qty 0.23

## 2021-09-09 MED ORDER — HEPARIN SODIUM (PORCINE) 5000 UNIT/ML IJ SOLN: 5000.0000 [IU] | Freq: Three times a day (TID) | INTRAMUSCULAR | Status: DC

## 2021-09-09 MED ORDER — INSULIN ASPART 100 UNIT/ML IJ SOLN
10.0000 [IU] | Freq: Three times a day (TID) | INTRAMUSCULAR | Status: DC
  Administered 2021-09-10 – 2021-09-11 (×5): 10 [IU] via SUBCUTANEOUS

## 2021-09-09 MED ORDER — ISOSORBIDE MONONITRATE ER 30 MG PO TB24
30.0000 mg | ORAL_TABLET | Freq: Every day | ORAL | Status: DC
  Administered 2021-09-09 – 2021-09-11 (×3): 30 mg via ORAL
  Filled 2021-09-09 (×3): qty 1

## 2021-09-09 NOTE — Progress Notes (Signed)
Overnight progress note  Notified by RN that patient complained of 7/10 intensity chest pain this morning with subsided after 2 doses of nitroglycerin and a dose of morphine.  Currently chest pain-free, vital signs stable.  -EKG showing new left bundle branch block compared to recent tracings. -Stat troponin ordered -Discussed with cardiology (Dr. Alfred Levins) who reviewed the patient's EKGs.  It seems she had left bundle branch block on prior EKG done in April 2021 as well.  He recommends checking troponin and if significantly above baseline, then cardiology will make further recommendations.

## 2021-09-09 NOTE — Progress Notes (Signed)
OT Cancellation Note  Patient Details Name: Katherine [name] MRN: [id number] DOB: Dec 19, 1955   Cancelled Treatment:    Reason Eval/Treat Not Completed: Patient declined, no reason specified (pt wanting to take a nap/rest, just go back to bed from chair). Will reattempt as schedule allows.  [name], OTD, OTR/L Acute Rehab [phone]   [name] 09/09/2021, 3:40 PM

## 2021-09-09 NOTE — Progress Notes (Addendum)
The patient has been seen in conjunction with Harlan Stains, NP. All aspects of care have been considered and discussed. The patient has been personally interviewed, examined, and all clinical data has been reviewed.  Recommend medical therapy for angina.  Surgery is not an option as her LIMA is widely patent.  She has multiple layers of stent in the diagonal graft and in-stent restenosis.  Restenting will not be helpful.  SVG to PDA is widely patent. Continue long-acting nitrate, beta-blocker, and add Ranexa. Goals of care need to be discussed with the patient in terms of vascular disease and complexity. Needs physical therapy to allow independence. Overall tough situation.  I would not recommend repeat cardiac catheterization unless in the throes of a STEMI.  Approach would have to be left radial.  This would be awkward given the patient's obesity.  Revascularization options from CV standpoint are limited with current anatomy but could include therapy of SVG to RCA and native circumflex if she ever develops significant disease in those territories.  [facility] will sign off.   Medication Recommendations: Ranexa Other recommendations (labs, testing, etc): Physical therapy and consider goals of care discussion with palliative care service. Follow up as an outpatient: Dr. [name] is her primary cardiologist.   Progress Note  Patient Name: Katherine [name] Date of Encounter: 09/09/2021  [facility] Cardiologist: [name], MD   Subjective   No chest pain this morning, but had episode which woke her from sleep around 3am. Telemetry shows episode of WCT with rates in the 130s that correlates.    Inpatient Medications    Scheduled Meds:  (feeding supplement) PROSource Plus  30 mL Oral BID BM   apixaban  5 mg Oral BID   arformoterol  15 mcg Nebulization BID   vitamin C  500 mg Oral Daily   azaTHIOprine  125 mg Oral Daily   budesonide  2 mL Nebulization BID    calcitRIOL  0.25 mcg Oral Q3 days   Chlorhexidine Gluconate Cloth  6 each Topical Daily   clopidogrel  75 mg Oral Daily   diclofenac Sodium  2 g Topical TID   fluticasone  2 spray Each Nare Daily   gabapentin  100 mg Oral QHS   guaiFENesin  600 mg Oral BID   hydrocerin   Topical BID   hydrocortisone  25 mg Rectal BID   insulin aspart  0-15 Units Subcutaneous TID WC   insulin aspart  0-5 Units Subcutaneous QHS   insulin aspart  7 Units Subcutaneous TID WC   insulin glargine-yfgn  23 Units Subcutaneous Daily   ipratropium-albuterol  3 mL Nebulization BID   isosorbide dinitrate  10 mg Oral BID   lamoTRIgine  25 mg Oral QHS   linaclotide  145 mcg Oral QAC breakfast   linagliptin  5 mg Oral Daily   loratadine  10 mg Oral Daily   magic mouthwash  5 mL Oral QID   metoprolol tartrate  25 mg Oral TID   montelukast  10 mg Oral QHS   multivitamin with minerals  1 tablet Oral Daily   nutrition supplement (JUVEN)  1 packet Oral BID BM   nystatin   Topical TID   pantoprazole  40 mg Oral Daily   predniSONE  5 mg Oral Q breakfast   Ensure Max Protein  11 oz Oral TID BM   rosuvastatin  20 mg Oral Daily   senna-docusate  2 tablet Oral QHS   sertraline  100 mg  Oral Daily   sodium chloride flush  10-40 mL Intracatheter Q12H   sodium chloride flush  3 mL Intravenous Q12H   terbinafine   Topical BID   zinc sulfate  220 mg Oral Daily   Continuous Infusions:  PRN Meds: acetaminophen, alum & mag hydroxide-simeth, bisacodyl, cyclobenzaprine, diphenhydrAMINE, guaiFENesin-dextromethorphan, hydrALAZINE, ipratropium-albuterol, LORazepam, melatonin, menthol-cetylpyridinium, morphine injection, nitroGLYCERIN, oxyCODONE, polyethylene glycol, prochlorperazine **OR** prochlorperazine **OR** prochlorperazine, sodium chloride, sodium chloride flush, sodium phosphate   Vital Signs    Vitals:   09/08/21 2035 09/09/21 0355 09/09/21 0413 09/09/21 0813  BP: 132/68 133/86 120/83 (!) 120/56  Pulse: 88 70 93 89   Resp: (!) 21 (!) 21 16 (!) 22  Temp: 98.4 F (36.9 C) 98.6 F (37 C)  98.5 F (36.9 C)  TempSrc: Oral Oral  Oral  SpO2: 92% 93% 94% 98%  Weight:  90.2 kg    Height:        Intake/Output Summary (Last 24 hours) at 09/09/2021 1008 Last data filed at 09/09/2021 0926 Gross per 24 hour  Intake 530 ml  Output 2000 ml  Net -1470 ml   Last 3 Weights 09/09/2021 09/08/2021 09/07/2021  Weight (lbs) 198 lb 13.7 oz 197 lb 1.5 oz 206 lb 2.1 oz  Weight (kg) 90.2 kg 89.4 kg 93.5 kg      Telemetry    Afib rates 90, episode of WCT this morning around 3am (possibly atrial flutter RVR) - Personally Reviewed  ECG    WCT, 128 bpm - Personally Reviewed  Physical Exam   GEN: No acute distress.   Neck: + JVD Cardiac: Irreg Irreg, no murmurs, rubs, or gallops.  Respiratory: Clear to auscultation bilaterally. GI: Soft, nontender, non-distended  MS: No edema; No deformity. Neuro:  Nonfocal  Psych: Normal affect   Labs    High Sensitivity Troponin:   Recent Labs  Lab 08/19/21 1003 08/19/21 1135 09/01/21 1826 09/02/21 0447 09/09/21 0440  TROPONINIHS 93* 76* 52* 66* 75*     Chemistry Recent Labs  Lab 09/04/21 0031 09/05/21 0246 09/06/21 0224 09/07/21 0322 09/08/21 0340 09/09/21 0440  NA 131*   < > 130* 133* 133* 134*  K 3.9   < > 4.0 4.0 3.3* 4.2  CL 97*   < > 99 98 97* 97*  CO2 25   < > 21* 26 27 26   GLUCOSE 277*   < > 335* 311* 182* 332*  BUN 19   < > 25* 19 22 24*  CREATININE 0.74   < > 0.84 0.79 0.81 0.85  CALCIUM 8.5*   < > 8.7* 9.0 8.8* 9.0  MG 1.9  --  1.9  --  1.7  --   GFRNONAA >60   < > >60 >60 >60 >60  ANIONGAP 9   < > 10 9 9 11    < > = values in this interval not displayed.    Lipids No results for input(s): CHOL, TRIG, HDL, LABVLDL, LDLCALC, CHOLHDL in the last 168 hours.  Hematology Recent Labs  Lab 09/06/21 0224 09/07/21 0322 09/09/21 0440  WBC 5.7 6.9 9.1  RBC 2.66* 2.69* 2.98*  HGB 8.2* 8.3* 9.1*  HCT 26.3* 26.4* 29.0*  MCV 98.9 98.1 97.3  MCH  30.8 30.9 30.5  MCHC 31.2 31.4 31.4  RDW 17.0* 16.7* 17.0*  PLT 334 386 481*   Thyroid No results for input(s): TSH, FREET4 in the last 168 hours.  BNPNo results for input(s): BNP, PROBNP in the last 168 hours.  DDimer No results  for input(s): DDIMER in the last 168 hours.   Radiology    DG CHEST PORT 1 VIEW  Result Date: 09/07/2021 CLINICAL DATA:  Shortness of breath EXAM: PORTABLE CHEST 1 VIEW COMPARISON:  09/02/2021 FINDINGS: No significant change in rotated AP portable examination, with cardiomegaly status post median sternotomy and CABG. Unchanged, diffuse bilateral interstitial opacity. Left upper extremity PICC. IMPRESSION: No significant change in rotated AP portable examination, with cardiomegaly status post median sternotomy and CABG. Unchanged, diffuse bilateral interstitial opacity, consistent with edema or infection. Electronically Signed   By: Delanna Ahmadi M.D.   On: 09/07/2021 14:09    Cardiac Studies   LEFT HEART CATH AND CORS/GRAFTS ANGIOGRAPHY  09/05/21   Unsuccessful attempt at Left Heart Catheterization with Coronary and GraftAngiography via radial access.  Extensive radial artery spasm, unable to pass catheter beyond elbow. With wire removal, there was evidence of forearm hematoma being held with manual pressure. Only other potential option for angiography would be to consider ulnar access on the right arm versus brachial arterial access at a later date.   [name], MD   Echo 08/09/2021  1. Left ventricular ejection fraction, by estimation, is 45 to 50%. The  left ventricle has mildly decreased function. The left ventricle  demonstrates regional wall motion abnormalities (see scoring  diagram/findings for description). There is mild left  ventricular hypertrophy. Left ventricular diastolic function could not be  evaluated.   2. Right ventricular systolic function is normal. The right ventricular  size is normal.   3. The mitral valve is grossly normal.  Trivial mitral valve  regurgitation.   4. The aortic valve has an indeterminant number of cusps. There is  moderate calcification of the aortic valve. Aortic valve regurgitation is  not visualized. Aortic valve sclerosis/calcification is present, without  any evidence of aortic stenosis.   Comparison(s): Prior images reviewed side by side. Wall motion more  similar to last study with Definity contrast on 07/12/2020, although LVEF  somewhat better   Patient Profile     66 y.o. female  CAD s/p CABG with multiple PCIs since then, carotid artery stenosis (mild on L, moderate on R 2017), chornic diastolic CHF, DM, HTN, HLD, PAF, ESRD s/p renal transplant, GERD, mild AS, OSA, COPD with chronic respiratory failure on 3L home O2, RA, anemia, depression, fibromyalgia, GIB, migraines, right eye stroke, torsades/QT prolongation (in setting of electrolyte disturbance, Tikosyn, Zoloft, phenergan), morbid obesity s/p prior lap band, PAD, recurrent boils, PAD (occlusion of RCFA and LCFA per notes) admitted from CIR for angina.   Assessment & Plan    CAD with known complex dx/recurrent chest pain:Cath 08/20/21 and 09/05/21. Unsuccessful attempt Friday complicated with hematoma. Per cath report "Only other potential option for angiography would be to consider ulnar access on the right arm versus brachial arterial access at a later date".  -- Continue Plavix, transitioned Isordil to Imdur, Crestor and metoprolol 25mg  TID    Chronic diastolic CHF: Net I & O negative 7.5L. weight 203>>198lb  -- continue metoprolol 25mg  TID   Hypokalemia: resolved    Hx of renal transplant: follows with Dr. Johnney Ou  -- on Imuran and chronic prednisone -- Scr stable   Atrial fibrillation/Flutter: rates are relatively stable, but did have episode of RVR this morning which correlated with her chest pain. -- Continue BB and Eliquis   PAD: does complain of claudication. Seen by VVS, currently not felt to be a candidate for  aortobifemoral bypass grafting due to CAD and co-morbidities.  For questions or updates, please contact [place] Please consult [email or web address] for contact info under        Signed, Reino Bellis, NP  09/09/2021, 10:08 AM

## 2021-09-09 NOTE — Progress Notes (Signed)
PROGRESS NOTE   Katherine [name]  DDU:[id number]    DOB: [birth year]    DOA: 09/02/2021  PCP: [name], MD   I have briefly reviewed patients previous medical records in [facility]    Brief Narrative:  66 year old female with medical history significant for CAD s/p CABG and PCI, bilateral carotid artery stenosis, PAD, PAF on Eliquis, COPD, chronic respiratory failure with hypoxia on home oxygen 3 L per minute, IDDM2, s/p renal transplant on immunosuppressant,  urolithiasis, anemia, history of GI bleed,fibromyalgia/chronic pain, recent hospitalization from 07/25/2021-08/25/2021, for COVID with MRSA pneumonia. 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  Since the cardiac cath was attempted patient reports that she has had right leg pain at rest.  She also reported chest pain with activity.  Seen by vascular surgery and cardiology and transferred from CIR to [facility] for further evaluation and management.  She was admitted under [place].  [name] Kitchen    Assessment & Plan:  Active Problems:   Dyslipidemia, goal LDL below 70   CAD S/P percutaneous coronary angioplasty - PCI x 5 to SVG-D1   Debility   Chronic respiratory failure with hypoxia (HCC)   COPD (chronic obstructive pulmonary disease) (HCC)   History of renal transplant   Obesity (BMI 30-39.9)   Chronic diastolic CHF (congestive heart failure) (HCC)   Rest pain of lower extremity due to atherosclerosis (HCC)   CAD, s/p CABG, with known complex disease/angina: Cardiology on board and their input appreciated.  Recent NSTEMI during last hospitalization.  Had cath 08/20/2021 and 09/05/2021.  Technically unsuccessful coronary angiography via right radial on 2/37, complicated by hematoma.  Continue Plavix, Isordil, Crestor and metoprolol.  Outpatient follow-up with Dr. Ellyn Hack.   Had chest pain overnight 1/23.  Discussed with Dr. Tamala Julian, cardiology.  They have added Ranexa.  She also recommends goals of care discussion given vascular disease both cardiac and peripheral artery disease and complexity.  Placed consult for palliative care team.  He does not recommend repeat cardiac catheterization unless having a STEMI. OP follow up with Dr. Ellyn Hack.  Right>Left foot pain/PAD: As per vascular surgery input 1/19, known to have occluded bilateral common iliac arteries and common femoral arteries.  They recommended options being continued pain control versus bilateral common femoral endarterectomies with possible retrograde iliac stenting and possible axillary to bifemoral bypass.  However per cardiology, she would be high risk for any open surgery and since she was not in any pain, no plan for any further procedures.  Vascular surgery signed off 1/19.  Continue Plavix and Crestor.  Acute on chronic combined diastolic and systolic CHF: Last LVEF 62-83% in December 2022 treating with IV Lasix 40 mg daily.  Continue Isordil and metoprolol.  -7.6 L thus far.  Weight down from 203 pounds on admission to 197 pounds.  Clinically euvolemic.  COPD/chronic respiratory failure with hypoxia on 2 L/min at baseline: No clinical bronchospasm.  On scheduled nebs twice daily at home.  Stable.  Type II DM/IDDM: Holding metformin.  Uncontrolled on current insulin regimen.  Adjust insulins and monitor.  Continue Tradjenta.  Diabetes coordinator input appreciated.  Increased Semglee to 28 units daily and NovoLog mealtime to 10 units 3 times daily with meals  Hypokalemia: Replaced.  Magnesium 1.7.  Hyponatremia: May be hypervolemic hyponatremia versus SIADH.  Stable in the low 130s.  Follow BMP.  History of renal transplant: Creatinine normal.  Continue immunosuppressants.  Followed by Dr. Johnney Ou at [facility]  Chronic atrial fibrillation/flutter: Controlled ventricular rate on telemetry.   Continue metoprolol and Eliquis.  Anemia: Stable in the 8 g range.  Hemoglobin up to 9.1  Body mass index is 36.37 kg/m./Obesity    DVT prophylaxis: SCDs Start: 09/09/21 0813  Eliquis   Code Status: DNR:  Family Communication: None at bedside Disposition:  Status is: Inpatient  Remains inpatient appropriate because: IV Lasix for CHF         Consultants:   Vascular surgery Cardiology Palliative care medicine  Procedures:   Unsuccessful cardiac catheter on 1/20  Antimicrobials:      Subjective:  Overnight events noted.  Had chest pain lasting about 45 minutes, relieved with sublingual NTG.  No chest pain noted this morning  Objective:   Vitals:   09/09/21 0813 09/09/21 1122 09/09/21 1359 09/09/21 1654  BP: (!) 120/56   (!) 118/96  Pulse: 89   80  Resp: (!) 22   (!) 21  Temp: 98.5 F (36.9 C)   98.7 F (37.1 C)  TempSrc: Oral   Oral  SpO2: 98% 94%  96%  Weight:   87.3 kg   Height:        General exam: Middle-age female, moderately built and obese sitting up comfortably in bed without distress. ENT: Throat clear without acute findings.  No sinus tenderness appreciated. Respiratory system: Clear to auscultation.  No increased work of breathing. Cardiovascular system: S1 & S2 heard, RRR. No JVD, murmurs, rubs, gallops or clicks. No pedal edema.  Telemetry personally reviewed: A. fib with controlled ventricular rate. Gastrointestinal system: Abdomen is nondistended, soft and nontender. No organomegaly or masses felt. Normal bowel sounds heard. Central nervous system: Alert and oriented. No focal neurological deficits. Extremities: Symmetric 5 x 5 power. Skin: No rashes, lesions or ulcers Psychiatry: Judgement and insight appear normal. Mood & affect appropriate.     Data Reviewed:   I have personally reviewed following labs and imaging studies   CBC: Recent Labs  Lab 09/06/21 0224 09/07/21 0322 09/09/21 0440  WBC 5.7 6.9 9.1  HGB 8.2* 8.3* 9.1*   HCT 26.3* 26.4* 29.0*  MCV 98.9 98.1 97.3  PLT 334 386 481*    Basic Metabolic Panel: Recent Labs  Lab 09/04/21 0031 09/05/21 0246 09/06/21 0224 09/07/21 0322 09/08/21 0340 09/09/21 0440  NA 131* 132* 130* 133* 133* 134*  K 3.9 3.4* 4.0 4.0 3.3* 4.2  CL 97* 96* 99 98 97* 97*  CO2 25 25 21* 26 27 26   GLUCOSE 277* 198* 335* 311* 182* 332*  BUN 19 21 25* 19 22 24*  CREATININE 0.74 0.80 0.84 0.79 0.81 0.85  CALCIUM 8.5* 8.7* 8.7* 9.0 8.8* 9.0  MG 1.9  --  1.9  --  1.7  --     Liver Function Tests: No results for input(s): AST, ALT, ALKPHOS, BILITOT, PROT, ALBUMIN in the last 168 hours.  CBG: Recent Labs  Lab 09/09/21 0816 09/09/21 1128 09/09/21 1613  GLUCAP 328* 337* 399*    Microbiology Studies:  No results found for this or any previous visit (from the past 240 hour(s)).  Radiology Studies:  No results found.  Scheduled Meds:    (feeding supplement) PROSource Plus  30 mL Oral BID BM   apixaban  5 mg Oral BID   arformoterol  15 mcg Nebulization BID   vitamin C  500 mg Oral  Daily   azaTHIOprine  125 mg Oral Daily   budesonide  2 mL Nebulization BID   calcitRIOL  0.25 mcg Oral Q3 days   Chlorhexidine Gluconate Cloth  6 each Topical Daily   clopidogrel  75 mg Oral Daily   diclofenac Sodium  2 g Topical TID   fluticasone  2 spray Each Nare Daily   gabapentin  100 mg Oral QHS   guaiFENesin  600 mg Oral BID   hydrocerin   Topical BID   hydrocortisone  25 mg Rectal BID   insulin aspart  0-15 Units Subcutaneous TID WC   insulin aspart  0-5 Units Subcutaneous QHS   insulin aspart  7 Units Subcutaneous TID WC   insulin glargine-yfgn  23 Units Subcutaneous Daily   ipratropium-albuterol  3 mL Nebulization BID   isosorbide mononitrate  30 mg Oral Daily   lamoTRIgine  25 mg Oral QHS   linaclotide  145 mcg Oral QAC breakfast   linagliptin  5 mg Oral Daily   loratadine  10 mg Oral Daily   magic mouthwash  5 mL Oral QID   metoprolol tartrate  25 mg Oral TID    montelukast  10 mg Oral QHS   multivitamin with minerals  1 tablet Oral Daily   nutrition supplement (JUVEN)  1 packet Oral BID BM   nystatin   Topical TID   pantoprazole  40 mg Oral Daily   predniSONE  5 mg Oral Q breakfast   Ensure Max Protein  11 oz Oral TID BM   rosuvastatin  20 mg Oral Daily   senna-docusate  2 tablet Oral QHS   sertraline  100 mg Oral Daily   sodium chloride flush  10-40 mL Intracatheter Q12H   sodium chloride flush  3 mL Intravenous Q12H   terbinafine   Topical BID   zinc sulfate  220 mg Oral Daily    Continuous Infusions:     LOS: 7 days     Vernell Leep, MD,  FACP, [facility], [facility], [facility] (Care Management Physician Certified) [place]  To contact the attending provider between 7A-7P or the covering provider during after hours 7P-7A, please log into the web site [email or web address] and access using universal [place] password for that web site. If you do not have the password, please call the hospital operator.  09/09/2021, 6:09 PM

## 2021-09-09 NOTE — Progress Notes (Signed)
Inpatient Diabetes Program Recommendations  AACE/ADA: New Consensus Statement on Inpatient Glycemic Control (2015)  Target Ranges:  Prepandial:   less than 140 mg/dL      Peak postprandial:   less than 180 mg/dL (1-2 hours)      Critically ill patients:  140 - 180 mg/dL   Lab Results  Component Value Date   GLUCAP 328 (H) 09/09/2021   HGBA1C 7.9 (H) 08/11/2021    Review of Glycemic Control  Latest Reference Range & Units 09/08/21 16:42 09/08/21 20:55 09/09/21 08:16  Glucose-Capillary 70 - 99 mg/dL 326 (H) 285 (H) 328 (H)  (H): Data is abnormally high Diabetes history: DM 2 Outpatient Diabetes medications: Amaryl 4 mg Daily, Novolog 4-10 units tid, Basaglar 20 units Daily, Metformin 500 mg bid Current orders for Inpatient glycemic control:  Tradjenta 5 mg Daily Semglee 23 units Daily Novolog 7 units tid meal coverage Novolog 0-15 units tid  On Prednisone 5 mg QD (hx renal transplant)  Ensure max tid between meals A1c 7.9% on 08/11/21   Inpatient Diabetes Program Recommendations:     Consider increasing Semglee to 28 units QD and increasing Novolog to 10 units TID (assuming patient consuming >50%)   Thanks, Bronson Curb, MSN, RNC-OB Diabetes Coordinator [phone] (8a-5p)

## 2021-09-09 NOTE — Progress Notes (Signed)
Physical Therapy Treatment Patient Details Name: Katherine [name] MRN: [id number] DOB: Dec 30, 1955 Today's Date: 09/09/2021   History of Present Illness Pt is a 66 y/o F admitted 07/25/21-08/25/21 for COVID wtih MRSA, PNA, hospital course complicated by NSTEMI. Discharged to AIR where she was progressing mobility however with increased activity experiencing increased R LE claudical pain as well as chest pain and increased HR/A-fib. Pt returned to hospital 09/02/21 for treatment of PAD R LE>LE and reattempt heart catheterization 09/05/21. Failed cardiac cath 1/20. PMH includes CAD s/p CABG and PCI, COPD at home on 3L O2 via [place], DM2, s/p renal transplant, anxiety, depression, anemia, fibromyalgia, and GERD.    PT Comments    Pt reports that she had a bout of chest pain early this morning but would like to get out of bed to eat her lunch. In process of transfer pt found to have small BM, pt requesting transfer to [facility] before getting up to chair. Pt is minA with her mobility today mainly due to management of lines not physical assist. Pt very happy once up in chair. D/c plan remains appropriate at this time.     Recommendations for follow up therapy are one component of a multi-disciplinary discharge planning process, led by the attending physician.  Recommendations may be updated based on patient status, additional functional criteria and insurance authorization.  Follow Up Recommendations  Home health PT (may require PTAR home due to 5 steps to enter, pending progression)     Assistance Recommended at Discharge Frequent or constant Supervision/Assistance  Patient can return home with the following A lot of help with bathing/dressing/bathroom;Assistance with cooking/housework;Assistance with feeding;Assist for transportation;Help with stairs or ramp for entrance;A little help with walking and/or transfers   Equipment Recommendations  None recommended by PT       Precautions / Restrictions  Precautions Precautions: Fall Precaution Comments: monitor sats/chest pain/anxiety Restrictions Weight Bearing Restrictions: No     Mobility  Bed Mobility Overal bed mobility: Needs Assistance Bed Mobility: Supine to Sit, Sit to Supine     Supine to sit: HOB elevated, Min assist     General bed mobility comments: min A for pad scoot only because she was noted to have small BM and wanted to get to [facility] prior to pericare    Transfers Overall transfer level: Needs assistance Equipment used: None Transfers: Sit to/from Stand Sit to Stand: Min guard   Step pivot transfers: Min assist       General transfer comment: min guard for sit>stand and min A for management of lines for pivot transfer to BSC, min guard for standing for pericare with UE support on EoB and min A for pivoting to recliner          Balance Overall balance assessment: Needs assistance Sitting-balance support: Feet supported Sitting balance-Leahy Scale: Good Sitting balance - Comments: sits unsupported EOB with supervision.   Standing balance support: No upper extremity supported, During functional activity Standing balance-Leahy Scale: Fair                              Cognition Arousal/Alertness: Awake/alert Behavior During Therapy: WFL for tasks assessed/performed Overall Cognitive Status: Within Functional Limits for tasks assessed                                 General Comments: immediately agreeable to get out of bed to  eat lunch        Exercises General Exercises - Lower Extremity Ankle Circles/Pumps: AROM, Both, 10 reps Long Arc Quad: AROM, Both, 10 reps Straight Leg Raises: AROM, Both, 10 reps, Seated    General Comments General comments (skin integrity, edema, etc.): Pt on 4L O2 via [place] with SpO2 94%O2 on entry, pt requiring 6L O2 for mobility to maintain SpO2 >91%O2 with mobility      Pertinent Vitals/Pain Pain Assessment Pain Assessment: No/denies  pain Faces Pain Scale: Hurts a little bit     PT Goals (current goals can now be found in the care plan section) Acute Rehab PT Goals Patient Stated Goal: to go home PT Goal Formulation: With patient Time For Goal Achievement: 09/17/21 Potential to Achieve Goals: Good Progress towards PT goals: Progressing toward goals    Frequency    Min 3X/week      PT Plan Current plan remains appropriate       AM-PAC PT "6 Clicks" Mobility   Outcome Measure  Help needed turning from your back to your side while in a flat bed without using bedrails?: A Little Help needed moving from lying on your back to sitting on the side of a flat bed without using bedrails?: A Little Help needed moving to and from a bed to a chair (including a wheelchair)?: A Little Help needed standing up from a chair using your arms (e.g., wheelchair or bedside chair)?: A Little Help needed to walk in hospital room?: A Little Help needed climbing 3-5 steps with a railing? : A Lot 6 Click Score: 17    End of Session Equipment Utilized During Treatment: Gait belt;Oxygen Activity Tolerance: Patient tolerated treatment well Patient left: with call bell/phone within reach;with bed alarm set;in chair;with chair alarm set Nurse Communication: Mobility status PT Visit Diagnosis: Muscle weakness (generalized) (M62.81);Difficulty in walking, not elsewhere classified (R26.2);Unsteadiness on feet (R26.81);Other abnormalities of gait and mobility (R26.89)     Time: 5883-2549 PT Time Calculation (min) (ACUTE ONLY): 29 min  Charges:  $Therapeutic Activity: 23-37 mins                     Dareth Andrew B. [name] PT, DPT Acute Rehabilitation Services Pager [phone] Office [phone]    [place] 09/09/2021, 1:51 PM

## 2021-09-09 NOTE — Progress Notes (Addendum)
At 0340, pt c/o chest pain rated 7/10. Nitro given and pain decreased to 5/10. EKG obtained and paged on call physician. Nitro x2 given and pain decreased to 3/10. Pt refused more nitro, morphine was administered and pt stated pain was relieved 0/10. Awaiting callback from on call physician.  Update: Physician called back and ordered troponin. Awaiting results.

## 2021-09-10 ENCOUNTER — Other Ambulatory Visit (HOSPITAL_COMMUNITY): Payer: Self-pay

## 2021-09-10 ENCOUNTER — Other Ambulatory Visit: Payer: Self-pay | Admitting: Cardiology

## 2021-09-10 LAB — GLUCOSE, CAPILLARY
Glucose-Capillary: 124 mg/dL — ABNORMAL HIGH (ref 70–99)
Glucose-Capillary: 171 mg/dL — ABNORMAL HIGH (ref 70–99)
Glucose-Capillary: 172 mg/dL — ABNORMAL HIGH (ref 70–99)
Glucose-Capillary: 305 mg/dL — ABNORMAL HIGH (ref 70–99)
Glucose-Capillary: 394 mg/dL — ABNORMAL HIGH (ref 70–99)
Glucose-Capillary: 408 mg/dL — ABNORMAL HIGH (ref 70–99)

## 2021-09-10 LAB — BASIC METABOLIC PANEL
Anion gap: 9 (ref 5–15)
BUN: 29 mg/dL — ABNORMAL HIGH (ref 8–23)
CO2: 26 mmol/L (ref 22–32)
Calcium: 9.3 mg/dL (ref 8.9–10.3)
Chloride: 96 mmol/L — ABNORMAL LOW (ref 98–111)
Creatinine, Ser: 0.87 mg/dL (ref 0.44–1.00)
GFR, Estimated: >60 mL/min (ref >60)
Glucose, Bld: 380 mg/dL — ABNORMAL HIGH (ref 70–99)
Potassium: 4.3 mmol/L (ref 3.5–5.1)
Sodium: 131 mmol/L — ABNORMAL LOW (ref 135–145)

## 2021-09-10 MED ORDER — INSULIN ASPART 100 UNIT/ML IJ SOLN
5.0000 [IU] | Freq: Once | INTRAMUSCULAR | Status: AC
  Administered 2021-09-10: 07:00:00 5 [IU] via SUBCUTANEOUS

## 2021-09-10 MED ORDER — ALTEPLASE 2 MG IJ SOLR
2.0000 mg | Freq: Once | INTRAMUSCULAR | Status: AC
  Administered 2021-09-11: 2 mg
  Filled 2021-09-10: qty 2

## 2021-09-10 MED ORDER — INSULIN GLARGINE-YFGN 100 UNIT/ML ~~LOC~~ SOLN
18.0000 [IU] | Freq: Two times a day (BID) | SUBCUTANEOUS | Status: DC
  Administered 2021-09-10 – 2021-09-11 (×2): 18 [IU] via SUBCUTANEOUS
  Filled 2021-09-10 (×3): qty 0.18

## 2021-09-10 MED ORDER — INSULIN GLARGINE-YFGN 100 UNIT/ML ~~LOC~~ SOLN: 35.0000 [IU] | Freq: Every day | SUBCUTANEOUS | Status: DC

## 2021-09-10 NOTE — Progress Notes (Signed)
Inpatient Diabetes Program Recommendations  AACE/ADA: New Consensus Statement on Inpatient Glycemic Control (2015)  Target Ranges:  Prepandial:   less than 140 mg/dL      Peak postprandial:   less than 180 mg/dL (1-2 hours)      Critically ill patients:  140 - 180 mg/dL   Lab Results  Component Value Date   GLUCAP 305 (H) 09/10/2021   HGBA1C 7.9 (H) 08/11/2021    Review of Glycemic Control  Latest Reference Range & Units 09/10/21 05:48 09/10/21 07:57 09/10/21 13:11  Glucose-Capillary 70 - 99 mg/dL 408 (H) 394 (H) 305 (H)  (H): Data is abnormally high Diabetes history: DM 2 Outpatient Diabetes medications: Amaryl 4 mg Daily, Novolog 4-10 units tid, Basaglar 20 units Daily, Metformin 500 mg bid Current orders for Inpatient glycemic control:  Tradjenta 5 mg Daily Semglee 35 units Daily Novolog 10 units tid meal coverage Novolog 0-15 units tid Novolog 5 units x1  On Prednisone 5 mg QD (hx renal transplant)   Ensure max tid between meals A1c 7.9% on 08/11/21   Inpatient Diabetes Program Recommendations:     Consider further increasing Novolog to 14 units TID (assuming patient consuming >50%). Question if splitting basal into BID dosing may provide improved coverage?   Thanks, Bronson Curb, MSN, RNC-OB Diabetes Coordinator [phone] (8a-5p)

## 2021-09-10 NOTE — Plan of Care (Signed)
°  Problem: Nutrition: Goal: Adequate nutrition will be maintained Outcome: Progressing   Problem: Nutrition: Goal: Adequate nutrition will be maintained Outcome: Progressing

## 2021-09-10 NOTE — Consult Note (Signed)
Consultation Note Date: 09/10/2021   Patient Name: Katherine [name]  DOB: 04-06-1956  MRN: [id number]  Age / Sex: 66 y.o., female  PCP: [name], MD Referring Physician: Hosie Poisson, MD  Reason for Consultation:   HPI/Patient Profile: 66 y.o. female  with past medical history of CAD s/p CABG, bilateral carotid artery stenosis, PAD, COPD, chronic respiratory failure on home oxygen, renal transplant, fibromyalgia/chronic pain admitted on 09/02/2021 from CIR due to chest pain. Prior to that was admitted 12/9-1/9 for COVID and MRSA pneumonia during which she experienced NSTEMI, had cath with no interventions due to no access. Palliative medicine consulted for [place] regarding her advanced vascular disease.   Primary Decision Maker PATIENT  Discussion: I have reviewed medical records including EPIC notes, labs and imaging, and then met at the bedside with patient to discuss diagnosis prognosis, GOC, EOL wishes, disposition and options.  I introduced Palliative Medicine as specialized medical care for people living with serious illness. It focuses on providing relief from the symptoms and stress of a serious illness. The goal is to improve quality of life for both the patient and the family.  As far as functional and nutritional status - she reports she is eating well. She is requiring minimal assistance for ADL's. PT is recommending home with home health.   We discussed patient's current illness and what it means in the larger context of patient's on-going co-morbidities.  Natural disease trajectory and expectations at EOL were discussed.  Katherine [name] shared that she is well aware that she has a "bad heart". However, she says she isn't interested in discussing her EOL decisions. She states "if I die, I die"- but she isn't interested in otherwise deescalating her medical interventions.  Her main GOC at this time  are to return home and spend as much time with her family as possible- which she is expecting to happen tomorrow. Her family has kept the Christmas tree up for her and she is looking forward to a late Christmas celebration with them.    She has a large family with several siblings who provide support. She lives with her younger brother. She enjoys hummingbirds.   Discussed with patient/family the importance of continued conversation with family and the medical providers regarding overall plan of care and treatment options, ensuring decisions are within the context of the patients values and GOCs.    Palliative Care services outpatient were explained and offered.     SUMMARY OF RECOMMENDATIONS -Continue current level of care- no de-escalation, full scope, treat what is treatable, she has DNR in place by previous provider -She is agreeable to outpatient Palliative referral for ongoing support of her chronic illness    Code Status/Advance Care Planning: DNR   Prognosis:   Unable to determine  Discharge Planning: Home with Home Health  Primary Diagnoses: Present on Admission:  Rest pain of lower extremity due to atherosclerosis ([place])  Chronic diastolic CHF (congestive heart failure) (HCC)  Chronic respiratory failure with hypoxia (HCC)  COPD (chronic obstructive pulmonary disease) (  HCC)  Dyslipidemia, goal LDL below 70  Obesity (BMI 30-39.9)  Debility   Review of Systems  Physical Exam  Vital Signs: BP 102/63 (BP Location: Right Arm)    Pulse 99    Temp 98.3 F (36.8 C) (Oral)    Resp 20    Ht $R'5\' 1"'mD$  (1.549 m)    Wt 87.3 kg    SpO2 96%    BMI 36.37 kg/m  Pain Scale: 0-10 POSS *See Group Information*: 1-Acceptable,Awake and alert Pain Score: 2    SpO2: SpO2: 96 % O2 Device:SpO2: 96 % O2 Flow Rate: .O2 Flow Rate (L/min): 3 L/min  IO: Intake/output summary:  Intake/Output Summary (Last 24 hours) at 09/10/2021 1651 Last data filed at 09/09/2021 1807 Gross per 24 hour   Intake --  Output 300 ml  Net -300 ml    LBM: Last BM Date: 09/10/21 Baseline Weight: Weight: 92.3 kg Most recent weight: Weight: 87.3 kg     Palliative Assessment/Data: PPS: 60%       Thank you for this consult. Palliative medicine will continue to follow and assist as needed.   Signed by: Mariana Kaufman, AGNP-C Palliative Medicine    Please contact Palliative Medicine Team phone at [phone] for questions and concerns.  For individual provider: See Shea Evans

## 2021-09-10 NOTE — TOC Benefit Eligibility Note (Signed)
Patient Teacher, English as a foreign language completed.    The patient is currently admitted and upon discharge could be taking ranolazine (Ranexa) 500 mg tablets.  The current 30 day co-pay is, $1.45.   The patient is insured through [place] Patient Advocate Specialist [place] Patient Advocate Team Direct Number: [phone]  Fax: [phone]

## 2021-09-10 NOTE — Progress Notes (Addendum)
Occupational Therapy Treatment Patient Details Name: Katherine [name] MRN: [id number] DOB: 11/07/1955 Today's Date: 09/10/2021   History of present illness Pt is a 66 y/o F admitted 07/25/21-08/25/21 for COVID wtih MRSA, PNA, hospital course complicated by NSTEMI. Discharged to AIR where she was progressing mobility however with increased activity experiencing increased R LE claudical pain as well as chest pain and increased HR/A-fib. Pt returned to hospital 09/02/21 for treatment of PAD R LE>LE and reattempt heart catheterization 09/05/21. Failed cardiac cath 1/20. PMH includes CAD s/p CABG and PCI, COPD at home on 3L O2 via [place], DM2, s/p renal transplant, anxiety, depression, anemia, fibromyalgia, and GERD.   OT comments  Pt progressing towards goals, supervision level overall for ADLs, bed mobility, and transfers. Pt able to complete step pivot transfer x2 during session and toileting/pericare. Pt demonstrates increased awareness, declining help during therapy session stating "let me do it, I will have to do it on my own at home". Pt fatigues easily, however VSS stable throughout session. Pt presenting with impairments listed below, will follow acutely. Recommend d/c home with [place].   Recommendations for follow up therapy are one component of a multi-disciplinary discharge planning process, led by the attending physician.  Recommendations may be updated based on patient status, additional functional criteria and insurance authorization.    Follow Up Recommendations  Home health OT    Assistance Recommended at Discharge Intermittent Supervision/Assistance  Patient can return home with the following  A little help with walking and/or transfers;A little help with bathing/dressing/bathroom;Assist for transportation;Help with stairs or ramp for entrance;Assistance with cooking/housework   Equipment Recommendations  None recommended by OT;Other (comment) (pt has all DME)    Recommendations for Other  Services PT consult    Precautions / Restrictions Precautions Precautions: Fall Precaution Comments: monitor sats/chest pain/anxiety Restrictions Weight Bearing Restrictions: No       Mobility Bed Mobility Overal bed mobility: Needs Assistance Bed Mobility: Supine to Sit     Supine to sit: Supervision     General bed mobility comments: pt declining assistance stating " she will have to do this on her own at home"    Transfers Overall transfer level: Needs assistance Equipment used: None Transfers: Sit to/from Stand Sit to Stand: Supervision     Step pivot transfers: Supervision           Balance Overall balance assessment: Needs assistance Sitting-balance support: Feet supported Sitting balance-Leahy Scale: Good Sitting balance - Comments: sits unsupported EOB with supervision.   Standing balance support: No upper extremity supported, During functional activity Standing balance-Leahy Scale: Fair                             ADL either performed or assessed with clinical judgement   ADL                           Toilet Transfer: Armed forces technical officer Details (indicate cue type and reason): completed x2 Toileting- Clothing Manipulation and Hygiene: Moderate assistance;Sit to/from stand Toileting - Clothing Manipulation Details (indicate cue type and reason): assistance needed for posterior pericare     Functional mobility during ADLs: Supervision/safety      Extremity/Trunk Assessment Upper Extremity Assessment Upper Extremity Assessment: Generalized weakness   Lower Extremity Assessment Lower Extremity Assessment: Defer to PT evaluation        Vision   Vision Assessment?: No apparent visual deficits  Perception Perception Perception: Not tested   Praxis Praxis Praxis: Not tested    Cognition Arousal/Alertness: Awake/alert Behavior During Therapy: WFL for tasks assessed/performed Overall  Cognitive Status: Within Functional Limits for tasks assessed                                          Exercises      Shoulder Instructions       General Comments VSS throughout session    Pertinent Vitals/ Pain       Pain Assessment Pain Assessment: No/denies pain  Home Living                                          Prior Functioning/Environment              Frequency  Min 2X/week        Progress Toward Goals  OT Goals(current goals can now be found in the care plan section)  Progress towards OT goals: Progressing toward goals  Acute Rehab OT Goals Patient Stated Goal: none stated OT Goal Formulation: With patient Time For Goal Achievement: 09/17/21 Potential to Achieve Goals: Good ADL Goals Pt Will Perform Lower Body Bathing: with adaptive equipment;with modified independence Pt Will Perform Lower Body Dressing: with modified independence;sit to/from stand Pt Will Transfer to Toilet: with modified independence;ambulating Pt Will Perform Toileting - Clothing Manipulation and hygiene: with modified independence Pt Will Perform Tub/Shower Transfer: with mod assist;ambulating;rolling walker Pt/caregiver will Perform Home Exercise Program: Increased strength;Both right and left upper extremity;With theraband;Independently Additional ADL Goal #1: Independently demosntrate 3 energy conservation strategies during ADL session Additional ADL Goal #2: independently verbalize 3 strategies to reduce risk of falls  Plan Discharge plan remains appropriate    Co-evaluation                 AM-PAC OT "6 Clicks" Daily Activity     Outcome Measure   Help from another person eating meals?: None Help from another person taking care of personal grooming?: None Help from another person toileting, which includes using toliet, bedpan, or urinal?: A Little Help from another person bathing (including washing, rinsing, drying)?: A  Little Help from another person to put on and taking off regular upper body clothing?: A Little Help from another person to put on and taking off regular lower body clothing?: A Little 6 Click Score: 20    End of Session Equipment Utilized During Treatment: Oxygen  OT Visit Diagnosis: Other abnormalities of gait and mobility (R26.89);Muscle weakness (generalized) (M62.81);Pain   Activity Tolerance Patient tolerated treatment well   Patient Left in chair;with call bell/phone within reach   Nurse Communication Mobility status;Other (comment) (per RN pt does not need chair alarm)        Time: 1610-9604 OT Time Calculation (min): 23 min  Charges: OT General Charges $OT Visit: 1 Visit OT Treatments $Self Care/Home Management : 23-37 mins  [name], OTD, OTR/L Acute Rehab [phone] - [place] 09/10/2021, 5:44 PM

## 2021-09-10 NOTE — Plan of Care (Signed)
°  Problem: Education: Goal: Knowledge of General Education information will improve Description: Including pain rating scale, medication(s)/side effects and non-pharmacologic comfort measures 09/10/2021 1538 by Emmaline Life, RN Outcome: Progressing 09/10/2021 1537 by Emmaline Life, RN Outcome: Progressing   Problem: Health Behavior/Discharge Planning: Goal: Ability to manage health-related needs will improve 09/10/2021 1538 by Emmaline Life, RN Outcome: Progressing 09/10/2021 1537 by Emmaline Life, RN Outcome: Progressing

## 2021-09-10 NOTE — Plan of Care (Signed)
  Problem: Education: Goal: Knowledge of General Education information will improve Description Including pain rating scale, medication(s)/side effects and non-pharmacologic comfort measures Outcome: Progressing   Problem: Health Behavior/Discharge Planning: Goal: Ability to manage health-related needs will improve Outcome: Progressing   

## 2021-09-10 NOTE — Progress Notes (Signed)
PROGRESS NOTE    Katherine [name]  HGD:[id number] DOB: 17-Apr-1956 DOA: 09/02/2021 PCP: [name], MD    No chief complaint on file.   Brief Narrative:  66 year old female with medical history significant for CAD s/p CABG and PCI, bilateral carotid artery stenosis, PAD, PAF on Eliquis, COPD, chronic respiratory failure with hypoxia on home oxygen 3 L per minute, IDDM2, s/p renal transplant on immunosuppressant,  urolithiasis, anemia, history of GI bleed,fibromyalgia/chronic pain, recent hospitalization from 07/25/2021-08/25/2021, for COVID with MRSA pneumonia. 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  Since the cardiac cath was attempted patient reports that she has had right leg pain at rest.  She also reported chest pain with activity.  Seen by vascular surgery and cardiology and transferred from CIR to [facility] for further evaluation and management.  She was admitted under [place].  [name] Kitchen   Assessment & Plan:   Active Problems:   Dyslipidemia, goal LDL below 70   CAD S/P percutaneous coronary angioplasty - PCI x 5 to SVG-D1   Debility   Chronic respiratory failure with hypoxia (HCC)   COPD (chronic obstructive pulmonary disease) (HCC)   History of renal transplant   Obesity (BMI 30-39.9)   Chronic diastolic CHF (congestive heart failure) (HCC)   Rest pain of lower extremity due to atherosclerosis [facility])   Coronary artery disease s/p CABG Recent NSTEMI during last hospitalization underwent catheterization on 09/05/2021. Unsuccessful coronary angiography via right radial on 9/62/2297 complicated by hematoma. Continue with Plavix, Isordil Crestor, metoprolol and Ranexa.  Palliative care consulted.  Cardiology did not recommend repeat cardiac catheterization unless having STEMI. Recommend outpatient follow-up with Dr.  Ellyn Hack.    Right more than left foot pain secondary to peripheral artery disease History of occluded bilateral common iliac arteries and common femoral arteries. Patient is high risk for open surgery. Continue with Plavix and Crestor.  Vascular surgery signed off on 09/04/2021.   Acute on chronic combined diastolic and systolic heart failure Last echocardiogram showed left ventricular ejection fraction of 45 to 50% Weekly euvolemic Continue with strict intake and output and daily weights.   Diabetes mellitus, insulin-dependent, uncontrolled with hyperglycemia Changed to [facility] from daily to 18 units twice daily, continue with sliding scale insulin.    COPD/chronic respiratory failure with hypoxia on 2 L of nasal cannula oxygen. Continue with bronchodilators as needed.   Hypokalemia replaced.   History of renal transplant Continue with immunosuppressants and follow-up with [facility].  Chronic atrial fibrillation Continue with metoprolol and on Eliquis for anticoagulation.   Anemia of chronic disease Continue to monitor.  Body mass index is 36.37 kg/m. Obesity Increased risk of mortality and morbidity   DVT prophylaxis: Eliquis Code Status: DNR Family Communication: none at bedside.  Disposition:   Status is: Inpatient  Remains inpatient appropriate because: elevated cbg's       Consultants:  Cardiology.   Procedures: none.   Antimicrobials: none.   Subjective:   Objective: Vitals:   09/10/21 0624 09/10/21 0627 09/10/21 0800 09/10/21 1423  BP: (!) 98/58 105/60  102/63  Pulse: 72 80  99  Resp: (!) 23 20  20   Temp: 98 F (36.7 C)   98.3 F (36.8 C)  TempSrc: Oral   Oral  SpO2: 93% 95% 96%   Weight:      Height:  Intake/Output Summary (Last 24 hours) at 09/10/2021 1536 Last data filed at 09/09/2021 1807 Gross per 24 hour  Intake 240 ml  Output 300 ml  Net -60 ml   Filed Weights   09/08/21 0455 09/09/21 0355  09/09/21 1359  Weight: 89.4 kg 90.2 kg 87.3 kg    Examination:  General exam: Appears calm and comfortable  Respiratory system: Clear to auscultation. Respiratory effort normal. Cardiovascular system: S1 & S2 heard, RRR. No JVD,  No pedal edema. Gastrointestinal system: Abdomen is nondistended, soft and nontender. Normal bowel sounds heard. Central nervous system: Alert and oriented. No focal neurological deficits. Extremities: Symmetric 5 x 5 power. Skin: No rashes, lesions or ulcers Psychiatry: Mood & affect appropriate.     Data Reviewed: I have personally reviewed following labs and imaging studies  CBC: Recent Labs  Lab 09/04/21 0031 09/05/21 0246 09/06/21 0224 09/07/21 0322 09/09/21 0440  WBC 6.1 6.5 5.7 6.9 9.1  HGB 8.7* 8.7* 8.2* 8.3* 9.1*  HCT 27.5* 27.6* 26.3* 26.4* 29.0*  MCV 100.0 97.9 98.9 98.1 97.3  PLT 225 287 334 386 481*    Basic Metabolic Panel: Recent Labs  Lab 09/04/21 0031 09/05/21 0246 09/06/21 0224 09/07/21 0322 09/08/21 0340 09/09/21 0440 09/10/21 0550  NA 131*   < > 130* 133* 133* 134* 131*  K 3.9   < > 4.0 4.0 3.3* 4.2 4.3  CL 97*   < > 99 98 97* 97* 96*  CO2 25   < > 21* 26 27 26 26   GLUCOSE 277*   < > 335* 311* 182* 332* 380*  BUN 19   < > 25* 19 22 24* 29*  CREATININE 0.74   < > 0.84 0.79 0.81 0.85 0.87  CALCIUM 8.5*   < > 8.7* 9.0 8.8* 9.0 9.3  MG 1.9  --  1.9  --  1.7  --   --    < > = values in this interval not displayed.    GFR: Estimated Creatinine Clearance: 65.6 mL/min (by C-G formula based on SCr of 0.87 mg/dL).  Liver Function Tests: No results for input(s): AST, ALT, ALKPHOS, BILITOT, PROT, ALBUMIN in the last 168 hours.  CBG: Recent Labs  Lab 09/09/21 2129 09/10/21 0025 09/10/21 0548 09/10/21 0757 09/10/21 1311  GLUCAP 142* 171* 408* 394* 305*     No results found for this or any previous visit (from the past 240 hour(s)).       Radiology Studies: No results found.      Scheduled Meds:   (feeding supplement) PROSource Plus  30 mL Oral BID BM   apixaban  5 mg Oral BID   arformoterol  15 mcg Nebulization BID   vitamin C  500 mg Oral Daily   azaTHIOprine  125 mg Oral Daily   budesonide  2 mL Nebulization BID   calcitRIOL  0.25 mcg Oral Q3 days   Chlorhexidine Gluconate Cloth  6 each Topical Daily   clopidogrel  75 mg Oral Daily   diclofenac Sodium  2 g Topical TID   fluticasone  2 spray Each Nare Daily   gabapentin  100 mg Oral QHS   guaiFENesin  600 mg Oral BID   hydrocerin   Topical BID   hydrocortisone  25 mg Rectal BID   insulin aspart  0-15 Units Subcutaneous TID WC   insulin aspart  0-5 Units Subcutaneous QHS   insulin aspart  10 Units Subcutaneous TID WC   insulin glargine-yfgn  18 Units Subcutaneous BID  ipratropium-albuterol  3 mL Nebulization BID   isosorbide mononitrate  30 mg Oral Daily   lamoTRIgine  25 mg Oral QHS   linaclotide  145 mcg Oral QAC breakfast   linagliptin  5 mg Oral Daily   loratadine  10 mg Oral Daily   magic mouthwash  5 mL Oral QID   metoprolol tartrate  25 mg Oral TID   montelukast  10 mg Oral QHS   multivitamin with minerals  1 tablet Oral Daily   nutrition supplement (JUVEN)  1 packet Oral BID BM   nystatin   Topical TID   pantoprazole  40 mg Oral Daily   predniSONE  5 mg Oral Q breakfast   Ensure Max Protein  11 oz Oral TID BM   rosuvastatin  20 mg Oral Daily   senna-docusate  2 tablet Oral QHS   sertraline  100 mg Oral Daily   sodium chloride flush  10-40 mL Intracatheter Q12H   sodium chloride flush  3 mL Intravenous Q12H   terbinafine   Topical BID   zinc sulfate  220 mg Oral Daily   Continuous Infusions:   LOS: 8 days        Hosie Poisson, MD [facility]   To contact the attending provider between 7A-7P or the covering provider during after hours 7P-7A, please log into the web site [email or web address] and access using universal [place] password for that web site. If you do not have the password, please  call the hospital operator.  09/10/2021, 3:36 PM

## 2021-09-11 ENCOUNTER — Other Ambulatory Visit (HOSPITAL_COMMUNITY): Payer: Self-pay

## 2021-09-11 LAB — GLUCOSE, CAPILLARY
Glucose-Capillary: 155 mg/dL — ABNORMAL HIGH (ref 70–99)
Glucose-Capillary: 316 mg/dL — ABNORMAL HIGH (ref 70–99)

## 2021-09-11 MED ORDER — GABAPENTIN 100 MG PO CAPS: 100.0000 mg | ORAL_CAPSULE | Freq: Every day | ORAL | 1 refills | Status: DC

## 2021-09-11 MED ORDER — PROSOURCE PLUS PO LIQD: 30.0000 mL | Freq: Two times a day (BID) | ORAL | 0 refills | Status: DC

## 2021-09-11 MED ORDER — ISOSORBIDE MONONITRATE ER 30 MG PO TB24: 30.0000 mg | ORAL_TABLET | Freq: Every day | ORAL | 2 refills | Status: DC

## 2021-09-11 MED ORDER — ENSURE MAX PROTEIN PO LIQD: 11.0000 [oz_av] | Freq: Three times a day (TID) | ORAL | 2 refills | Status: DC

## 2021-09-11 MED ORDER — METOPROLOL TARTRATE 25 MG PO TABS: 25.0000 mg | ORAL_TABLET | Freq: Three times a day (TID) | ORAL | 1 refills | Status: DC

## 2021-09-11 MED ORDER — RANOLAZINE ER 500 MG PO TB12: 500.0000 mg | ORAL_TABLET | Freq: Two times a day (BID) | ORAL | 0 refills | Status: DC

## 2021-09-11 MED ORDER — ZINC SULFATE 220 (50 ZN) MG PO CAPS: 220.0000 mg | ORAL_CAPSULE | Freq: Every day | ORAL | Status: DC

## 2021-09-11 MED ORDER — JUVEN PO PACK: 1.0000 | PACK | Freq: Two times a day (BID) | ORAL | 0 refills | Status: DC

## 2021-09-11 MED ORDER — ASCORBIC ACID 500 MG PO TABS: 500.0000 mg | ORAL_TABLET | Freq: Every day | ORAL | 1 refills | Status: DC

## 2021-09-11 MED ORDER — ADULT MULTIVITAMIN W/MINERALS CH: 1.0000 | ORAL_TABLET | Freq: Every day | ORAL | Status: DC

## 2021-09-11 NOTE — Consult Note (Signed)
[facility] Inpatient Consult   09/11/2021  Braylinn Gulden April 20, 1956 [id number]  [place] Management [facility] CM)  Patient chart reviewed due to extreme high risk score for unplanned readmission. Assessed for [facility] CM post hospital care needs. Patient is active with chronic care and chronic disease management team in primary care office.     Plan: Will update community care manager of patient progression and disposition.   Of note, [facility] Care Management services does not replace or interfere with any services that are arranged by inpatient case management or social work.

## 2021-09-11 NOTE — Plan of Care (Signed)
  Problem: Nutrition: Goal: Adequate nutrition will be maintained Outcome: Progressing   

## 2021-09-11 NOTE — Progress Notes (Signed)
PT Progress Note   PT Notes: Pt supine in bed on entry, initially reluctant to work on stair training, agreeable to PTAR transfer home, however with encouragement pt is able to demonstrate stair climb of 4 steps commensurate with home set up, with min assist. Educated pt on need use of gait belt. Pt reports feeling like she will be able to get home with help of family. Pt also able to ambulate to bathroom and toilet with only min guard assist. Pt will continue to progress in her home environment with help of HHPT.      09/11/21 1000  PT Visit Information  Last PT Received On 09/11/21  Assistance Needed +1  History of Present Illness Pt is a 66 y/o F admitted 07/25/21-08/25/21 for COVID wtih MRSA, PNA, hospital course complicated by NSTEMI. Discharged to AIR where she was progressing mobility however with increased activity experiencing increased R LE claudical pain as well as chest pain and increased HR/A-fib. Pt returned to hospital 09/02/21 for treatment of PAD R LE>LE and reattempt heart catheterization 09/05/21. Failed cardiac cath 1/20. PMH includes CAD s/p CABG and PCI, COPD at home on 3L O2 via [place], DM2, s/p renal transplant, anxiety, depression, anemia, fibromyalgia, and GERD.  Subjective Data  Subjective Pt reports her legs almost gave out when trying to transfer off bedside commode yesterday.  Patient Stated Goal to go home  Precautions  Precautions Fall  Precaution Comments monitor sats/chest pain/anxiety  Restrictions  Weight Bearing Restrictions No  Pain Assessment  Pain Assessment Faces  Faces Pain Scale 2  Pain Location generalized  Pain Descriptors / Indicators Discomfort;Grimacing;Guarding  Pain Intervention(s) Monitored during session;Repositioned  Cognition  Arousal/Alertness Awake/alert  Behavior During Therapy WFL for tasks assessed/performed  Overall Cognitive Status Within Functional Limits for tasks assessed  Bed Mobility  Overal bed mobility Needs Assistance  Bed  Mobility Supine to Sit;Sit to Supine  Supine to sit Supervision  General bed mobility comments supervision for safety  Transfers  Overall transfer level Needs assistance  Equipment used None  Transfers Sit to/from Stand  Sit to Stand Min guard  General transfer comment min guard for power up and steadying at RW, pt self corrects hand placement prior to power up, use of hand rail in bathroom to assist in power up from lower toilet seat  Ambulation/Gait  Ambulation/Gait assistance Min guard  Gait Distance (Feet) 30 Feet  Assistive device Rolling walker (2 wheels)  Gait Pattern/deviations Step-through pattern;Decreased stride length  General Gait Details slow, shuffling gait  Gait velocity decr  Gait velocity interpretation <1.8 ft/sec, indicate of risk for recurrent falls  Stairs Yes  Stairs assistance Min assist  Stair Management Two rails;One rail Left;Sideways;Forwards;Backwards  Number of Stairs 1 (x4)  General stair comments educated on how to instruct family to assist with gait belt to get up the 4 steps into her home. Pt  Balance  Overall balance assessment Needs assistance  Sitting-balance support Feet supported  Sitting balance-Leahy Scale Good  Sitting balance - Comments sits unsupported EOB with supervision.  Standing balance support No upper extremity supported;During functional activity  Standing balance-Leahy Scale Fair  General Comments  General comments (skin integrity, edema, etc.) VSS on 4L O2 via [place], VSS  PT - End of Session  Equipment Utilized During Treatment Gait belt;Oxygen  Activity Tolerance Patient tolerated treatment well  Patient left with call bell/phone within reach;with bed alarm set;in chair;with chair alarm set  Nurse Communication Mobility status   PT - Assessment/Plan  PT Plan Current plan remains appropriate  PT Visit Diagnosis Muscle weakness (generalized) (M62.81);Difficulty in walking, not elsewhere classified (R26.2);Unsteadiness on feet  (R26.81);Other abnormalities of gait and mobility (R26.89)  PT Frequency (ACUTE ONLY) Min 3X/week  Follow Up Recommendations Home health PT (may require PTAR home due to 5 steps to enter, pending progression)  Assistance recommended at discharge Frequent or constant Supervision/Assistance  Patient can return home with the following A lot of help with bathing/dressing/bathroom;Assistance with cooking/housework;Assistance with feeding;Assist for transportation;Help with stairs or ramp for entrance;A little help with walking and/or transfers  PT equipment None recommended by PT  AM-PAC PT "6 Clicks" Mobility Outcome Measure (Version 2)  Help needed turning from your back to your side while in a flat bed without using bedrails? 3  Help needed moving from lying on your back to sitting on the side of a flat bed without using bedrails? 3  Help needed moving to and from a bed to a chair (including a wheelchair)? 3  Help needed standing up from a chair using your arms (e.g., wheelchair or bedside chair)? 3  Help needed to walk in hospital room? 3  Help needed climbing 3-5 steps with a railing?  3  6 Click Score 18  Consider Recommendation of Discharge To: Home with [facility]  Progressive Mobility  What is the highest level of mobility based on the progressive mobility assessment? Level 4 (Walks with assist in room) - Balance while marching in place and cannot step forward and back - Complete  Activity Ambulated with assistance in room  PT Goal Progression  Progress towards PT goals Progressing toward goals  Acute Rehab PT Goals  PT Goal Formulation With patient  Time For Goal Achievement 09/17/21  Potential to Achieve Goals Good  PT Time Calculation  PT Start Time (ACUTE ONLY) 0953  PT Stop Time (ACUTE ONLY) 1024  PT Time Calculation (min) (ACUTE ONLY) 31 min  PT General Charges  $$ ACUTE PT VISIT 1 Visit  PT Treatments  $Gait Training 8-22 mins  $Therapeutic Activity 8-22 mins   Jagdeep Ancheta B. [name] PT, DPT Acute Rehabilitation Services Pager [phone] Office [phone]

## 2021-09-11 NOTE — Progress Notes (Signed)
Alteplase unsuccessful,PICC line no blood return. RN made aware,may need CXR to check placement. RN to address day shift, pt for possible discharge.

## 2021-09-11 NOTE — Care Management Important Message (Signed)
Important Message  Patient Details  Name: Katherine [name] MRN: [id number] Date of Birth: [birth year]   Medicare Important Message Given:  Yes     [name] 09/11/2021, 11:39 AM

## 2021-09-11 NOTE — TOC Benefit Eligibility Note (Signed)
Patient Teacher, English as a foreign language completed.    The patient is currently admitted and upon discharge could be taking Jardiance 10 mg.  The current 30 day co-pay is, $4.30.   The patient is currently admitted and upon discharge could be taking Farxiga 10 mg.  The current 30 day co-pay is, $4.30.   The patient is insured through [name] Patient Advocate Specialist [place] Patient Advocate Team Direct Number: [phone]  Fax: [phone]

## 2021-09-11 NOTE — TOC Transition Note (Signed)
Transition of Care [facility]) - CM/SW Discharge Note   Patient Details  Name: Kieran Nachtigal MRN: [id number] Date of Birth: [birth year]  Transition of Care [facility]) CM/SW Contact:  Bethena Roys, RN Phone Number: 09/11/2021, 11:38 AM   Clinical Narrative:  Case Manager received outpatient palliative referral for this patient. Case Manager spoke with patient and she is agreeable with [facility] for outpatient palliative services. Referral called to [facility]. Patient states she has transportation home. No further needs from Case Manager at this time.     Final next level of care: [place] Barriers to Discharge: Continued Medical Work up   Patient Goals and CMS Choice Patient states their goals for this hospitalization and ongoing recovery are:: Patient wants to return home once stable. CMS Medicare.gov Compare Post Acute Care list provided to:: Patient Choice offered to / list presented to : Patient  Discharge Plan and Services In-house Referral: NA Discharge Planning Services: CM Consult Post Acute Care Choice: Home Health          DME Arranged: N/A DME Agency: NA       HH Arranged: PT, OT, Nurse's Aide [place] Agency: [place] ([facility]) Date HH Agency Contacted: 09/08/21 Time [place]: 1603 Representative spoke with at [place]: [place] ([place]) Interventions     Readmission Risk Interventions Readmission Risk Prevention Plan 09/08/2021 05/14/2021 02/21/2021  Transportation Screening Complete Complete Complete  HRI or [place] Work Consult for [place] - - -  Medication Review Press photographer) Complete Complete Complete  PCP or Specialist appointment within 3-5 days of discharge Complete Complete Complete  HRI or Home Care Consult Complete Complete Complete  SW Recovery Care/Counseling  Consult Complete Complete Complete  Palliative Care Screening Not Applicable Not Applicable Not [place] Not Applicable Not Applicable Not Applicable  Some recent data might be hidden

## 2021-09-11 NOTE — Care Management Important Message (Signed)
Important Message  Patient Details  Name: Katherine [name] MRN: [id number] Date of Birth: [birth year]   Medicare Important Message Given:  Yes     [name] 09/11/2021, 11:50 AM

## 2021-09-13 DIAGNOSIS — E1159 Type 2 diabetes mellitus with other circulatory complications: Secondary | ICD-10-CM | POA: Diagnosis not present

## 2021-09-13 DIAGNOSIS — M797 Fibromyalgia: Secondary | ICD-10-CM | POA: Diagnosis not present

## 2021-09-13 DIAGNOSIS — I5043 Acute on chronic combined systolic (congestive) and diastolic (congestive) heart failure: Secondary | ICD-10-CM | POA: Diagnosis not present

## 2021-09-13 DIAGNOSIS — I69398 Other sequelae of cerebral infarction: Secondary | ICD-10-CM | POA: Diagnosis not present

## 2021-09-13 DIAGNOSIS — J9611 Chronic respiratory failure with hypoxia: Secondary | ICD-10-CM | POA: Diagnosis not present

## 2021-09-13 DIAGNOSIS — E1165 Type 2 diabetes mellitus with hyperglycemia: Secondary | ICD-10-CM | POA: Diagnosis not present

## 2021-09-13 DIAGNOSIS — I152 Hypertension secondary to endocrine disorders: Secondary | ICD-10-CM | POA: Diagnosis not present

## 2021-09-13 DIAGNOSIS — M069 Rheumatoid arthritis, unspecified: Secondary | ICD-10-CM | POA: Diagnosis not present

## 2021-09-13 DIAGNOSIS — I480 Paroxysmal atrial fibrillation: Secondary | ICD-10-CM | POA: Diagnosis not present

## 2021-09-13 DIAGNOSIS — I25110 Atherosclerotic heart disease of native coronary artery with unstable angina pectoris: Secondary | ICD-10-CM | POA: Diagnosis not present

## 2021-09-13 DIAGNOSIS — F17210 Nicotine dependence, cigarettes, uncomplicated: Secondary | ICD-10-CM | POA: Diagnosis not present

## 2021-09-13 DIAGNOSIS — K219 Gastro-esophageal reflux disease without esophagitis: Secondary | ICD-10-CM | POA: Diagnosis not present

## 2021-09-13 DIAGNOSIS — I70223 Atherosclerosis of native arteries of extremities with rest pain, bilateral legs: Secondary | ICD-10-CM | POA: Diagnosis not present

## 2021-09-13 DIAGNOSIS — E785 Hyperlipidemia, unspecified: Secondary | ICD-10-CM | POA: Diagnosis not present

## 2021-09-13 DIAGNOSIS — J449 Chronic obstructive pulmonary disease, unspecified: Secondary | ICD-10-CM | POA: Diagnosis not present

## 2021-09-13 DIAGNOSIS — I350 Nonrheumatic aortic (valve) stenosis: Secondary | ICD-10-CM | POA: Diagnosis not present

## 2021-09-13 DIAGNOSIS — E1151 Type 2 diabetes mellitus with diabetic peripheral angiopathy without gangrene: Secondary | ICD-10-CM | POA: Diagnosis not present

## 2021-09-13 DIAGNOSIS — I6523 Occlusion and stenosis of bilateral carotid arteries: Secondary | ICD-10-CM | POA: Diagnosis not present

## 2021-09-13 DIAGNOSIS — G8929 Other chronic pain: Secondary | ICD-10-CM | POA: Diagnosis not present

## 2021-09-13 DIAGNOSIS — I214 Non-ST elevation (NSTEMI) myocardial infarction: Secondary | ICD-10-CM | POA: Diagnosis not present

## 2021-09-13 DIAGNOSIS — G4733 Obstructive sleep apnea (adult) (pediatric): Secondary | ICD-10-CM | POA: Diagnosis not present

## 2021-09-13 DIAGNOSIS — F32A Depression, unspecified: Secondary | ICD-10-CM | POA: Diagnosis not present

## 2021-09-13 DIAGNOSIS — D638 Anemia in other chronic diseases classified elsewhere: Secondary | ICD-10-CM | POA: Diagnosis not present

## 2021-09-13 DIAGNOSIS — F411 Generalized anxiety disorder: Secondary | ICD-10-CM | POA: Diagnosis not present

## 2021-09-13 DIAGNOSIS — H5461 Unqualified visual loss, right eye, normal vision left eye: Secondary | ICD-10-CM | POA: Diagnosis not present

## 2021-09-14 NOTE — Discharge Summary (Signed)
Physician Discharge Summary  Katherine [name] OXB:[id number] DOB: 05/25/1956 DOA: 09/02/2021  PCP: [name], MD  Admit date: 09/02/2021 Discharge date: 09/11/2021  Admitted From: Home.  Disposition:  Home.   Recommendations for Outpatient Follow-up:  Follow up with PCP in 1-2 weeks Please obtain BMP/CBC in one week Please follow up with outpatient palliative care services.   Discharge Condition:stable.  CODE STATUS:full code.  Diet recommendation: Heart Healthy / Carb Modified   Brief/Interim Summary: 66 year old female with medical history significant for CAD s/p CABG and PCI, bilateral carotid artery stenosis, PAD, PAF on Eliquis, COPD, chronic respiratory failure with hypoxia on home oxygen 3 L per minute, IDDM2, s/p renal transplant on immunosuppressant,  urolithiasis, anemia, history of GI bleed,fibromyalgia/chronic pain, recent hospitalization from 07/25/2021-08/25/2021, for COVID with MRSA pneumonia. 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  Since the cardiac cath was attempted patient reports that she has had right leg pain at rest.  She also reported chest pain with activity.  Seen by vascular surgery and cardiology and transferred from CIR to [facility] for further evaluation and management.  She was admitted under [place].    Discharge Diagnoses:  Active Problems:   Dyslipidemia, goal LDL below 70   CAD S/P percutaneous coronary angioplasty - PCI x 5 to SVG-D1   Debility   Chronic respiratory failure with hypoxia (HCC)   COPD (chronic obstructive pulmonary disease) (HCC)   History of renal transplant   Obesity (BMI 30-39.9)   Chronic diastolic CHF (congestive heart failure) (HCC)   Rest pain of lower extremity due to atherosclerosis [facility])  Coronary artery disease s/p CABG Recent NSTEMI during last  hospitalization underwent catheterization on 09/05/2021. Unsuccessful coronary angiography via right radial on 6/83/4196 complicated by hematoma. Continue with Plavix, Isordil Crestor, metoprolol and Ranexa.  Palliative care consulted.  Cardiology did not recommend repeat cardiac catheterization unless having STEMI. Recommend outpatient follow-up with Dr. Ellyn Hack.       Right more than left foot pain secondary to peripheral artery disease History of occluded bilateral common iliac arteries and common femoral arteries. Patient is high risk for open surgery. Continue with Plavix and Crestor.  Vascular surgery signed off on 09/04/2021.     Acute on chronic combined diastolic and systolic heart failure Resolved.  Last echocardiogram showed left ventricular ejection fraction of 45 to 50% Continue with strict intake and output and daily weights.     Diabetes mellitus, insulin-dependent, uncontrolled with hyperglycemia Resume home meds on discharge.        COPD/chronic respiratory failure with hypoxia on 2 L of nasal cannula oxygen. Continue with bronchodilators as needed.     Hypokalemia replaced.     History of renal transplant Continue with immunosuppressants and follow-up with [facility].   Chronic atrial fibrillation Continue with metoprolol and on Eliquis for anticoagulation.     Anemia of chronic disease Continue to monitor.   Body mass index is 36.37 kg/m. Obesity Increased risk of mortality and morbidity      Discharge Instructions  Discharge Instructions     Diet - low sodium heart healthy   Complete by: As directed    Discharge instructions   Complete by: As directed    Please follow up with PCP in one week.   No wound care   Complete by: As directed  Allergies as of 09/11/2021       Reactions   Tetracycline Hives   Patient tolerated Doxycycline Dec 2020   Niacin Other (See Comments)   Mouth blisters   Niaspan [niacin Er]  Other (See Comments)   Mouth blisters   Sulfa Antibiotics Nausea Only, Other (See Comments)   "Tears up stomach"   Sulfonamide Derivatives Other (See Comments)   Reaction: per patient "tears her stomach up"   Codeine Nausea And Vomiting   Erythromycin Nausea And Vomiting   Hydromorphone Hcl Nausea And Vomiting   Morphine And Related Nausea And Vomiting   Nalbuphine Nausea And Vomiting   Nubain   Sulfasalazine Nausea Only, Other (See Comments)   per patient "tears her stomach up", "Tears up stomach"   Tape Rash, Other (See Comments)   No "plastic" tape," please----cloth tape only        Medication List     STOP taking these medications    clotrimazole 10 MG troche Commonly known as: MYCELEX   magnesium oxide 400 (240 Mg) MG tablet Commonly known as: MAG-OX   metoprolol succinate 25 MG 24 hr tablet Commonly known as: Toprol XL       TAKE these medications    (feeding supplement) PROSource Plus liquid Take 30 mLs by mouth 2 (two) times daily between meals.   nutrition supplement (JUVEN) Pack Take 1 packet by mouth 2 (two) times daily between meals.   Ensure Max Protein Liqd Take 330 mLs (11 oz total) by mouth 3 (three) times daily between meals.   acetaminophen 500 MG tablet Commonly known as: TYLENOL Take 1,000 mg by mouth daily.   albuterol 108 (90 Base) MCG/ACT inhaler Commonly known as: VENTOLIN HFA TAKE 2 PUFFS BY MOUTH EVERY 6 HOURS AS NEEDED FOR WHEEZE OR SHORTNESS OF BREATH What changed: See the new instructions.   apixaban 5 MG Tabs tablet Commonly known as: Eliquis Take 1 tablet (5 mg total) by mouth 2 (two) times daily. APPOINTMENT NEEDED WITH CARDIOLOGIST FOR FURTHER REFILLS What changed: additional instructions   arformoterol 15 MCG/2ML Nebu Commonly known as: BROVANA Take 2 mLs (15 mcg total) by nebulization 2 (two) times daily.   ascorbic acid 500 MG tablet Commonly known as: VITAMIN C Take 1 tablet (500 mg total) by mouth daily.    azaTHIOprine 50 MG tablet Commonly known as: IMURAN Take 125 mg by mouth See admin instructions. Take 2 1/2 tablets (125 mg) by mouth daily at 3pm   Basaglar KwikPen 100 UNIT/ML Inject 20 Units into the skin daily before breakfast.   budesonide 0.5 MG/2ML nebulizer solution Commonly known as: PULMICORT Take 2 mLs (0.5 mg total) by nebulization 2 (two) times daily.   calcitRIOL 0.25 MCG capsule Commonly known as: ROCALTROL Take 0.25 mcg by mouth every 3 (three) days.   cetirizine 5 MG tablet Commonly known as: ZYRTEC Take 5 mg by mouth daily.   clopidogrel 75 MG tablet Commonly known as: PLAVIX Take 1 tablet (75 mg total) by mouth daily.   cyclobenzaprine 10 MG tablet Commonly known as: FLEXERIL Take 1 tablet (10 mg total) by mouth 3 (three) times daily as needed for muscle spasms.   fluticasone 50 MCG/ACT nasal spray Commonly known as: FLONASE PLACE 2 SPRAYS INTO BOTH NOSTRILS 2 TIMES DAILY. What changed: See the new instructions.   gabapentin 100 MG capsule Commonly known as: NEURONTIN Take 1 capsule (100 mg total) by mouth at bedtime.   glimepiride 4 MG tablet Commonly known as: AMARYL Take 4  mg by mouth daily.   hydrocortisone 25 MG suppository Commonly known as: ANUSOL-HC Place 1 suppository (25 mg total) rectally 2 (two) times daily.   isosorbide mononitrate 30 MG 24 hr tablet Commonly known as: IMDUR Take 1 tablet (30 mg total) by mouth daily.   lamoTRIgine 25 MG tablet Commonly known as: LAMICTAL TAKE 1 TABLET BY MOUTH EVERYDAY AT BEDTIME What changed: See the new instructions.   linaclotide 145 MCG Caps capsule Commonly known as: LINZESS Take 1 capsule (145 mcg total) by mouth daily before breakfast.   LORazepam 1 MG tablet Commonly known as: ATIVAN Take 0.5 tablets (0.5 mg total) by mouth 2 (two) times daily as needed for anxiety.   metFORMIN 500 MG tablet Commonly known as: GLUCOPHAGE Take 1 tablet (500 mg total) by mouth 2 (two) times  daily.   metoprolol tartrate 25 MG tablet Commonly known as: LOPRESSOR Take 1 tablet (25 mg total) by mouth 3 (three) times daily.   montelukast 10 MG tablet Commonly known as: SINGULAIR TAKE 1 TABLET BY MOUTH EVERYDAY AT BEDTIME What changed: See the new instructions.   multivitamin with minerals Tabs tablet Take 1 tablet by mouth daily.   Nebulizer Devi Pt needs nebulizr machine dx J44.9   nitroGLYCERIN 0.4 MG SL tablet Commonly known as: NITROSTAT Place 1 tablet (0.4 mg total) under the tongue every 5 (five) minutes as needed for chest pain.   NovoLOG FlexPen 100 UNIT/ML FlexPen Generic drug: insulin aspart Inject 4-10 Units into the skin 3 (three) times daily with meals. Per sliding scale :not provided   nystatin powder Commonly known as: MYCOSTATIN/NYSTOP Apply topically 3 (three) times daily.   Oxycodone HCl 10 MG Tabs Take 1 tablet (10 mg total) by mouth 4 (four) times daily as needed. What changed: reasons to take this   OXYGEN Inhale 3 L into the lungs continuous.   pantoprazole 40 MG tablet Commonly known as: PROTONIX TAKE 1 TABLET BY MOUTH EVERY DAY What changed: how to take this   polyethylene glycol 17 g packet Commonly known as: MIRALAX / GLYCOLAX Take 17 g by mouth 2 (two) times daily.   predniSONE 10 MG tablet Commonly known as: DELTASONE Take 15mg  po daily for 3 days then 10mg  po daily for 3 days then 5mg  daily   promethazine 25 MG tablet Commonly known as: PHENERGAN Take 25 mg by mouth every 6 (six) hours as needed for nausea or vomiting.   ranolazine 500 MG 12 hr tablet Commonly known as: Ranexa Take 1 tablet (500 mg total) by mouth 2 (two) times daily.   rosuvastatin 20 MG tablet Commonly known as: CRESTOR Take 1 tablet (20 mg total) by mouth daily.   senna-docusate 8.6-50 MG tablet Commonly known as: Senokot-S Take 2 tablets by mouth 2 (two) times daily.   sertraline 100 MG tablet Commonly known as: ZOLOFT Take 1 tablet (100 mg  total) by mouth daily.   zinc sulfate 220 (50 Zn) MG capsule Take 1 capsule (220 mg total) by mouth daily.        Follow-up Information     [facility] Follow up.   Why: Home Health Physical Therapy, Occupational Therapy, Aide. Office to call the brother with visit times. If the patient has questions; can contact the agency at [phone].        [facility] Follow up.   Why: Palliative Services-office to call the home with visit times. Contact information: [place] [address] [phone]  Allergies  Allergen Reactions   Tetracycline Hives    Patient tolerated Doxycycline Dec 2020   Niacin Other (See Comments)    Mouth blisters   Niaspan [Niacin Er] Other (See Comments)    Mouth blisters   Sulfa Antibiotics Nausea Only and Other (See Comments)    "Tears up stomach"   Sulfonamide Derivatives Other (See Comments)    Reaction: per patient "tears her stomach up"   Codeine Nausea And Vomiting   Erythromycin Nausea And Vomiting   Hydromorphone Hcl Nausea And Vomiting   Morphine And Related Nausea And Vomiting   Nalbuphine Nausea And Vomiting    Nubain   Sulfasalazine Nausea Only and Other (See Comments)    per patient "tears her stomach up", "Tears up stomach"   Tape Rash and Other (See Comments)    No "plastic" tape," please----cloth tape only    Consultations: Palliative care   Procedures/Studies: DG Chest 2 View  Result Date: 08/29/2021 CLINICAL DATA:  Shortness of breath EXAM: CHEST - 2 VIEW COMPARISON:  08/14/2021 FINDINGS: Cardiac shadow remains enlarged. Postsurgical changes are noted. Increased vascular congestion and mild pulmonary edema right greater than left is noted. Left-sided PICC line is again identified likely in the left SVC. No pneumothorax or sizable effusion is seen. No bony abnormality is noted. IMPRESSION: CHF with parenchymal edema worse on the right than the left. These results will  be called to the ordering clinician or representative by the Radiologist Assistant, and communication documented in the PACS or [employer]. Electronically Signed   By: Inez Catalina M.D.   On: 08/29/2021 22:11   DG Knee 1-2 Views Right  Result Date: 09/01/2021 CLINICAL DATA:  Hervey Ard right knee pain, no injury. EXAM: RIGHT KNEE - 1-2 VIEW COMPARISON:  None. FINDINGS: Lateral view is slightly rotated. No definite joint effusion. No fracture. Chondrocalcinosis in the lateral compartment. Scattered surgical clips in the soft tissues. IMPRESSION: 1. No acute findings. 2. Chondrocalcinosis in the lateral compartment of the knee. Electronically Signed   By: Lorin Picket M.D.   On: 09/01/2021 11:41   MR LUMBAR SPINE WO CONTRAST  Result Date: 08/29/2021 CLINICAL DATA:  Initial evaluation for low back pain with right lower extremity pain. Immunocompromised. EXAM: MRI LUMBAR SPINE WITHOUT CONTRAST TECHNIQUE: Multiplanar, multisequence MR imaging of the lumbar spine was performed. No intravenous contrast was administered. COMPARISON:  Prior MRI from 08/25/2017. FINDINGS: Segmentation: Standard. Lowest well-formed disc space labeled the L5-S1 level. Alignment: 4 mm anterolisthesis of L4 on L5, chronic and facet mediated, and mildly progressed from prior. Trace retrolisthesis of T11 on T12, stable. Alignment otherwise normal with preservation of the normal lumbar lordosis. Vertebrae: Vertebral body height maintained without acute or chronic fracture. Bone marrow signal intensity within normal limits. No discrete or worrisome osseous lesions or abnormal marrow edema. Conus medullaris and cauda equina: Conus extends to the L1-2 level. Conus and cauda equina appear normal. Paraspinal and other soft tissues: Paraspinous soft tissues demonstrate no acute finding. The visualized native kidneys are markedly atrophic. Multifocal atheromatous irregularity seen throughout the intra-abdominal aorta without visible aneurysm.  Disc levels: T10-11: Seen only on sagittal projection. Intervertebral disc space narrowing with disc desiccation and mild disc bulge. Bilateral facet hypertrophy. No significant spinal stenosis. Foramina appear grossly patent. T11-12: Intervertebral disc space narrowing with mild disc bulge and disc desiccation. No significant stenosis. T12-L1: Unremarkable. L1-2: Disc desiccation without significant disc bulge. No canal or foraminal stenosis. L2-3: Disc desiccation without significant disc bulge. No canal or foraminal  stenosis. L3-4: Mild intervertebral disc space narrowing with diffuse disc bulge. Superimposed broad-based left foraminal to extraforaminal disc protrusion contacts the exiting left L3 nerve root (series 6, image 24). Associated annular fissure. Mild bilateral facet hypertrophy. No significant spinal stenosis. Mild left L3 foraminal narrowing. Right neural foramina remains patent. L4-5: 4 mm anterolisthesis. Mild disc bulge with disc desiccation. Superimposed left foraminal to extraforaminal disc protrusion contacts the exiting left L4 nerve root (series 3, image 12). Advanced bilateral facet arthrosis, progressed from previous. No significant canal or lateral recess stenosis. Mild left greater than right L4 foraminal narrowing. L5-S1: Broad-based left eccentric disc bulge closely approximates and/or contacts the exiting left L5 nerve root (series 6, image 35). Moderate bilateral facet arthrosis. No significant spinal stenosis. Mild left L5 foraminal narrowing without impingement. Right foramen remains patent. IMPRESSION: 1. Broad-based left foraminal to extraforaminal disc protrusions at L3-4 and L4-5, contacting and potentially irritating the exiting left L3 and L4 nerve roots respectively. 2. Broad-based left eccentric disc bulge at L5-S1, closely approximating and/or potentially irritating the exiting left L5 nerve root. 3. Advanced bilateral facet arthrosis at L4-5 with associated 4 mm  anterolisthesis, mildly progressed as compared to 2019. 4. No other acute abnormality within the lumbar spine. No findings to explain patient's right lower extremity symptoms identified. Aortic Atherosclerosis (ICD10-I70.0). Electronically Signed   By: Jeannine Boga M.D.   On: 08/29/2021 03:39   CARDIAC CATHETERIZATION  Addendum Date: 09/08/2021   CONCLUSIONS: Bilateral iliofemoral obstruction preventing catheterization.  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 Assumed patency of the left internal mammary over the last 2-3 heart catheterizations of the been performed. Prior hemodialysis access in the left arm preventing access. Prior coronary angiography over the past 2 years has been from right radial with native right coronary, left coronary, and saphenous vein bypass graft angiography and PCI performed from that approach. RECOMMENDATIONS: Medical therapy for coronary disease.  Elevated troponin in the setting of COVID-pneumonia given her underlying coronary disease could be ascribed to demand ischemia. The graft to the diagonal contains overlapping DES x2 layers with distal anastomosis ISR that was recently dilated in July.  This could be the culprit for ongoing angina.  It seems unreasonable to continue to dilate in this territory without a long-term solution. Would not perform coronary angiography again in the future unless the patient is having clear-cut ACS/STEMI.  If in that situation the only recourse would be to use right radial approach as vascular access. Discussed with Dr. Stanford Breed who will try to uptitrate the patient's medical regimen. Discussed in detail with the patient the limited options that we have for her coronary subset.  Result Date: 09/08/2021 CONCLUSIONS: Bilateral iliofemoral obstruction preventing catheterization.  Obstruction of the left femoral artery and total occlusion of the right femoral  artery were demonstrated by angiography. Assumed patency of the left internal mammary over the last 2-3 heart catheterizations of the been performed. Prior hemodialysis access in the left arm preventing access. Prior coronary angiography over the past 2 years has been from right radial with native right coronary, left coronary, and saphenous vein bypass graft angiography and PCI performed from that approach. RECOMMENDATIONS: Medical therapy for coronary disease.  Elevated troponin in the setting of COVID-pneumonia given her underlying coronary disease could be ascribed to demand ischemia. The graft to the diagonal contains overlapping and it x2 layers of stents with distal anastomosis ISR that was recently dilated in July.  This could be the culprit for ongoing angina.  It seems unreasonable to continue to dilate in this territory without a long-term solution. Would not perform coronary angiography again in the future unless the patient is having clear-cut ACS/STEMI.  If in that situation the only recourse would be to use right radial approach as vascular access. Discussed with Dr. Stanford Breed who will try to uptitrate the patient's medical regimen. Discussed in detail with the patient the limited options that we have for her coronary subset.   CARDIAC CATHETERIZATION  Result Date: 09/05/2021 Unsuccessful attempt at Left Heart Catheterization with Coronary and GraftAngiography via radial access.  Extensive radial artery spasm, unable to pass catheter beyond elbow. With wire removal, there was evidence of forearm hematoma being held with manual pressure. Only other potential option for angiography would be to consider ulnar access on the right arm versus brachial arterial access at a later date. [name], MD  DG CHEST PORT 1 VIEW  Result Date: 09/07/2021 CLINICAL DATA:  Shortness of breath EXAM: PORTABLE CHEST 1 VIEW COMPARISON:  09/02/2021 FINDINGS: No significant change in rotated AP portable examination,  with cardiomegaly status post median sternotomy and CABG. Unchanged, diffuse bilateral interstitial opacity. Left upper extremity PICC. IMPRESSION: No significant change in rotated AP portable examination, with cardiomegaly status post median sternotomy and CABG. Unchanged, diffuse bilateral interstitial opacity, consistent with edema or infection. Electronically Signed   By: Delanna Ahmadi M.D.   On: 09/07/2021 14:09   DG CHEST PORT 1 VIEW  Result Date: 09/02/2021 CLINICAL DATA:  Short of breath EXAM: PORTABLE CHEST 1 VIEW COMPARISON:  None. FINDINGS: Sternotomy wires overlie normal cardiac silhouette. Patient rotated leftward. Diffuse patchy airspace opacities are similar comparison exam. No pleural fluid. No pneumothorax. IMPRESSION: No change in diffuse patchy bilateral airspace disease consistent with multifocal pneumonia versus pulmonary edema. Electronically Signed   By: Suzy Bouchard M.D.   On: 09/02/2021 09:25   VAS [place] ABI WITH/WO TBI  Result Date: 08/30/2021  LOWER EXTREMITY DOPPLER STUDY Patient Name:  Marielouise Amey  Date of Exam:   08/30/2021 Medical Rec #: [id number]           Accession #:    [id number] Date of Birth: Jul 29, 1956           Patient Gender: F Patient Age:   66 years Exam Location:  [facility] Procedure:      VAS [place] ABI WITH/WO TBI Referring Phys: Alysia Penna --------------------------------------------------------------------------------  Indications: Rest pain, specifically around the shin area. Pperipheral artery              disease, (cath attempted 08/18/2021- Bilateral iliofemoral              obstruction) preventing catheterization. 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 High Risk         Hypertension, hyperlipidemia, Diabetes, coronary artery Factors:          disease. Other Factors: Recent Covid pneumonia, NSTEMI, history of renal transplant. CABG                 1994.  Vascular Interventions: No prior. Comparison Study: Prior LEA done 08/28/13 indicating possible inflow disease on  the right suggested by common femoral waveforms and severe                   bilateral common femoral artery stenosis. Performing Technologist: Sharion Dove RVS  Examination Guidelines: A complete evaluation includes at minimum, Doppler waveform signals and systolic blood pressure reading at the level of bilateral brachial, anterior tibial, and posterior tibial arteries, when vessel segments are accessible. Bilateral testing is considered an integral part of a complete examination. Photoelectric Plethysmograph (PPG) waveforms and toe systolic pressure readings are included as required and additional duplex testing as needed. Limited examinations for reoccurring indications may be performed as noted.  ABI Findings: +---------+------------------+-----+-------------------+--------+  Right     Rt Pressure (mmHg) Index Waveform            Comment   +---------+------------------+-----+-------------------+--------+  Brachial  115                      multiphasic                   +---------+------------------+-----+-------------------+--------+  PTA       87                 0.76  dampened monophasic           +---------+------------------+-----+-------------------+--------+  DP        22                 0.19  dampened monophasic           +---------+------------------+-----+-------------------+--------+  Great Toe 31                 0.27                                +---------+------------------+-----+-------------------+--------+ +---------+------------------+-----+----------+--------------------+  Left      Lt Pressure (mmHg) Index Waveform   Comment               +---------+------------------+-----+----------+--------------------+  Brachial                                      restricted (fistula)  +---------+------------------+-----+----------+--------------------+   PTA       254                2.21  monophasic                       +---------+------------------+-----+----------+--------------------+  DP        254                2.21  monophasic                       +---------+------------------+-----+----------+--------------------+  Great Toe 73                 0.63                                   +---------+------------------+-----+----------+--------------------+ +-------+----------------+-----------+------------+------------+  ABI/TBI Today's ABI      Today's TBI Previous ABI Previous TBI  +-------+----------------+-----------+------------+------------+  Right   0.76             0.27        0.96  0.70          +-------+----------------+-----------+------------+------------+  Left    non compressible 0.63        1.04         0.71          +-------+----------------+-----------+------------+------------+ Right ABIs appear decreased compared to prior study on 08/28/13. Bilateral TBIs appear decreased compared to prior study on 08/28/13. Left ABI is now non compressible since study done 08/28/13  Summary: Right: Resting right ankle-brachial index indicates moderate right lower extremity arterial disease. The right toe-brachial index is abnormal. ABI indicates a moderate decrease in flow by velocity, however, significantly dampened waveforms indicates a more severe level of disease. Left: Resting left ankle-brachial index indicates noncompressible left lower extremity arteries. The left toe-brachial index is abnormal.  *See table(s) above for measurements and observations.  Electronically signed by Jamelle Haring on 08/30/2021 at 3:15:06 PM.    Final    VAS US CAROTID  Result Date: 09/02/2021 Carotid Arterial Duplex Study Patient Name:  LEILY CAPEK  Date of Exam:   09/02/2021 Medical Rec #: [id number]           Accession #:    [id number] Date of Birth: 27-Nov-1955           Patient Gender: F Patient Age:   41 years Exam Location:  [facility] Procedure:       VAS US CAROTID Referring Phys: Jenkins Rouge --------------------------------------------------------------------------------  Risk Factors:      Hypertension, hyperlipidemia, Diabetes, current smoker,                    coronary artery disease. Limitations        Today's exam was limited due to the body habitus of the                    patient and patient position. Comparison Study:  No prior study Performing Technologist: Maudry Mayhew MHA, RDMS, RVT, RDCS  Examination Guidelines: A complete evaluation includes B-mode imaging, spectral Doppler, color Doppler, and power Doppler as needed of all accessible portions of each vessel. Bilateral testing is considered an integral part of a complete examination. Limited examinations for reoccurring indications may be performed as noted.  Right Carotid Findings: +----------+--------+--------+--------+-------------------------+---------+             PSV cm/s EDV cm/s Stenosis Plaque Description        Comments   +----------+--------+--------+--------+-------------------------+---------+  CCA Prox   52       20                                                     +----------+--------+--------+--------+-------------------------+---------+  CCA Distal 55       12                calcific                  Shadowing  +----------+--------+--------+--------+-------------------------+---------+  ICA Prox   193      57       40-59%   heterogenous and calcific Shadowing  +----------+--------+--------+--------+-------------------------+---------+  ICA Distal 75       17                                                     +----------+--------+--------+--------+-------------------------+---------+  ECA        93       5                                                      +----------+--------+--------+--------+-------------------------+---------+ +----------+--------+-------+--------------+-------------------+             PSV cm/s EDV cms Describe       Arm Pressure (mmHG)   +----------+--------+-------+--------------+-------------------+  Subclavian                  Not identified                      +----------+--------+-------+--------------+-------------------+ +---------+--------+--------+--------------+  Vertebral PSV cm/s EDV cm/s Not identified  +---------+--------+--------+--------------+  Left Carotid Findings: +----------+--------+--------+--------+------------------------------+---------+             PSV cm/s EDV cm/s Stenosis Plaque Description             Comments   +----------+--------+--------+--------+------------------------------+---------+  CCA Prox   95       19                                                          +----------+--------+--------+--------+------------------------------+---------+  CCA Distal 77       24                                                          +----------+--------+--------+--------+------------------------------+---------+  ICA Prox   151      41       40-59%   heterogenous, irregular and    Shadowing                                         calcific                                  +----------+--------+--------+--------+------------------------------+---------+  ICA Distal 137      38                                                          +----------+--------+--------+--------+------------------------------+---------+  ECA        140      34                heterogenous and calcific      shadowing  +----------+--------+--------+--------+------------------------------+---------+ +----------+--------+--------+--------------+-------------------+             PSV cm/s EDV cm/s Describe       Arm Pressure (mmHG)  +----------+--------+--------+--------------+-------------------+  Subclavian                   Not identified                      +----------+--------+--------+--------------+-------------------+ +---------+--------+--+--------+--+---------+  Vertebral PSV cm/s 56 EDV cm/s 20 Antegrade   +---------+--------+--+--------+--+---------+   Summary: Right Carotid: Velocities in the right ICA are consistent with a 40-59%                stenosis. Left Carotid: Velocities in the left ICA are consistent with a 40-59% stenosis. Vertebrals:  Left vertebral artery demonstrates antegrade flow. Right vertebral              artery was not visualized. 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 *See table(s) above for measurements and observations.  Electronically signed by Deitra Mayo MD on 09/02/2021 at 3:24:09 PM.    Final    VAS [place] LOWER EXTREMITY ARTERIAL DUPLEX  Result Date: 08/30/2021 LOWER EXTREMITY ARTERIAL DUPLEX STUDY Patient Name:  NECOLA BLUESTEIN  Date of Exam:   08/30/2021 Medical Rec #: [id number]           Accession #:    [id number] Date of Birth: March 02, 1956           Patient Gender: F Patient Age:   48 years Exam Location:  [facility] Procedure:      VAS [place] LOWER EXTREMITY ARTERIAL DUPLEX Referring Phys: Alysia Penna --------------------------------------------------------------------------------  Indications: Rest pain,specifically at shin area. 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 High Risk Factors: Hypertension, hyperlipidemia, Diabetes, prior MI, coronary                    artery disease. Other Factors: Kidney transplant, CABG, recent Covid pneumonia.  Vascular Interventions: No prior intervention. Current ABI:            R:0.76 L:non compressible Comparison Study: Prior LEA done 08/28/13 indicating possible inflow disease on                   the right suggested by right CFA waveforms. Severe bilateral                   CFA stenosis. Performing Technologist: Sharion Dove RVS  Examination Guidelines: A complete evaluation includes B-mode imaging, spectral  Doppler, color Doppler, and power Doppler as needed of all accessible portions of each vessel. Bilateral testing is considered an integral part of a complete examination. Limited examinations for reoccurring indications may be performed as noted.  +-----------+--------+-----+--------+----------+---------------------+  RIGHT       PSV cm/s Ratio Stenosis Waveform   Comments               +-----------+--------+-----+--------+----------+---------------------+  CFA Prox    52                      monophasic                        +-----------+--------+-----+--------+----------+---------------------+  DFA         22                      monophasic                        +-----------+--------+-----+--------+----------+---------------------+  SFA Prox    43  monophasic collateral flow noted  +-----------+--------+-----+--------+----------+---------------------+  SFA Mid     35                      monophasic                        +-----------+--------+-----+--------+----------+---------------------+  SFA Distal  32                      monophasic                        +-----------+--------+-----+--------+----------+---------------------+  POP Prox    22                      monophasic                        +-----------+--------+-----+--------+----------+---------------------+  POP Distal  32                      monophasic                        +-----------+--------+-----+--------+----------+---------------------+  ATA Prox    21                      monophasic                        +-----------+--------+-----+--------+----------+---------------------+  ATA Mid     25                      monophasic                        +-----------+--------+-----+--------+----------+---------------------+  ATA Distal  30                      monophasic                        +-----------+--------+-----+--------+----------+---------------------+  PTA Prox    39                      monophasic                         +-----------+--------+-----+--------+----------+---------------------+  PTA Mid     39                      monophasic                        +-----------+--------+-----+--------+----------+---------------------+  PTA Distal  53                      monophasic                        +-----------+--------+-----+--------+----------+---------------------+  PERO Prox                           absent                            +-----------+--------+-----+--------+----------+---------------------+  PERO Mid  absent                            +-----------+--------+-----+--------+----------+---------------------+  PERO Distal                         absent                            +-----------+--------+-----+--------+----------+---------------------+   +----------+--------+-----+--------+----------+--------+  LEFT       PSV cm/s Ratio Stenosis Waveform   Comments  +----------+--------+-----+--------+----------+--------+  CFA Prox   52                      monophasic           +----------+--------+-----+--------+----------+--------+  DFA        45                      monophasic           +----------+--------+-----+--------+----------+--------+  SFA Prox   57                      monophasic           +----------+--------+-----+--------+----------+--------+  SFA Mid    46                      monophasic           +----------+--------+-----+--------+----------+--------+  SFA Distal 32                      monophasic           +----------+--------+-----+--------+----------+--------+  POP Prox   31                      monophasic           +----------+--------+-----+--------+----------+--------+  POP Distal 24                      monophasic           +----------+--------+-----+--------+----------+--------+  ATA Prox   21                      monophasic           +----------+--------+-----+--------+----------+--------+  ATA Mid    25                      monophasic            +----------+--------+-----+--------+----------+--------+  ATA Distal 14                      monophasic           +----------+--------+-----+--------+----------+--------+  PTA Prox   30                      monophasic           +----------+--------+-----+--------+----------+--------+  PTA Mid    28                      monophasic           +----------+--------+-----+--------+----------+--------+  PTA Distal 25  monophasic           +----------+--------+-----+--------+----------+--------+  Summary: Right: Atherosclerotic plaque noted throughout. Monophasic waveforms suggestive of iliofemoral disease. Some collateral flow noted at the mid SFA. Left: Atherosclerotic plaque noted throughout. Monophasic waveforms suggestive of iliofemoral disease.  See table(s) above for measurements and observations. Electronically signed by Jamelle Haring on 08/30/2021 at 3:16:10 PM.    Final    VAS [place] LOWER EXTREMITY VENOUS (DVT)  Result Date: 08/28/2021  Lower Venous DVT Study Patient Name:  AXIE HAYNE  Date of Exam:   08/28/2021 Medical Rec #: [id number]           Accession #:    [id number] Date of Birth: 12-18-1955           Patient Gender: F Patient Age:   27 years Exam Location:  [facility] Procedure:      VAS [place] LOWER EXTREMITY VENOUS (DVT) Referring Phys: Alysia Penna --------------------------------------------------------------------------------  Indications: Pain.  Limitations: Body habitus and poor ultrasound/tissue interface. Comparison Study: 08/09/2021- negative bilateral lower extremity venous duplex Performing Technologist: Maudry Mayhew MHA, RDMS, RVT, RDCS  Examination Guidelines: A complete evaluation includes B-mode imaging, spectral Doppler, color Doppler, and power Doppler as needed of all accessible portions of each vessel. Bilateral testing is considered an integral part of a complete examination. Limited examinations for reoccurring indications may be performed as  noted. The reflux portion of the exam is performed with the patient in reverse Trendelenburg.  +---------+---------------+---------+-----------+----------+--------------+  RIGHT     Compressibility Phasicity Spontaneity Properties Thrombus Aging  +---------+---------------+---------+-----------+----------+--------------+  CFV       Full            Yes       Yes                                    +---------+---------------+---------+-----------+----------+--------------+  SFJ       Full                                                             +---------+---------------+---------+-----------+----------+--------------+  FV Prox   Full                                                             +---------+---------------+---------+-----------+----------+--------------+  FV Mid    Full                                                             +---------+---------------+---------+-----------+----------+--------------+  FV Distal Full                                                             +---------+---------------+---------+-----------+----------+--------------+  PFV       Full                                                             +---------+---------------+---------+-----------+----------+--------------+  POP       Full            Yes       Yes                                    +---------+---------------+---------+-----------+----------+--------------+  PTV       Full                                                             +---------+---------------+---------+-----------+----------+--------------+  PERO      Full                                                             +---------+---------------+---------+-----------+----------+--------------+   Right Technical Findings: Not visualized segments include limited visualization PTV and peroneal veins.  +---------+---------------+---------+-----------+----------+--------------+  LEFT      Compressibility Phasicity Spontaneity Properties Thrombus Aging   +---------+---------------+---------+-----------+----------+--------------+  CFV       Full            Yes       Yes                                    +---------+---------------+---------+-----------+----------+--------------+  SFJ       Full                                                             +---------+---------------+---------+-----------+----------+--------------+  FV Prox   Full                                                             +---------+---------------+---------+-----------+----------+--------------+  FV Mid    Full                                                             +---------+---------------+---------+-----------+----------+--------------+  FV Distal Full                                                             +---------+---------------+---------+-----------+----------+--------------+  PFV       Full                                                             +---------+---------------+---------+-----------+----------+--------------+  POP       Full            Yes       Yes                                    +---------+---------------+---------+-----------+----------+--------------+  PTV       Full                                                             +---------+---------------+---------+-----------+----------+--------------+  PERO      Full                                                             +---------+---------------+---------+-----------+----------+--------------+   Left Technical Findings: Not visualized segments include limited visualization PTV and peroneal veins.   Summary: RIGHT: - There is no evidence of deep vein thrombosis in the lower extremity. However, portions of this examination were limited- see technologist comments above.  - No cystic structure found in the popliteal fossa.  LEFT: - There is no evidence of deep vein thrombosis in the lower extremity. However, portions of this examination were limited- see technologist comments above.  - No  cystic structure found in the popliteal fossa.  *See table(s) above for measurements and observations. Electronically signed by Orlie Pollen on 08/28/2021 at 3:58:46 PM.    Final       Subjective: No new complaints.   Discharge Exam: Vitals:   09/11/21 1000 09/11/21 1100  BP:    Pulse: 93 (!) 46  Resp: (!) 28 (!) 22  Temp:    SpO2: (!) 88% 96%   Vitals:   09/11/21 0814 09/11/21 0900 09/11/21 1000 09/11/21 1100  BP:      Pulse:  71 93 (!) 46  Resp:   (!) 28 (!) 22  Temp:      TempSrc:      SpO2: 97% 93% (!) 88% 96%  Weight:      Height:        General: Pt is alert, awake, not in acute distress Cardiovascular: RRR, S1/S2 +, no rubs, no gallops Respiratory: CTA bilaterally, no wheezing, no rhonchi Abdominal: Soft, NT, ND, bowel sounds + Extremities: no edema, no cyanosis    The results of significant diagnostics from this hospitalization (including imaging, microbiology, ancillary and laboratory) are listed below for reference.     Microbiology: No results found for this or any previous visit (from the past 240 hour(s)).   Labs: BNP (last 3 results) Recent Labs    08/12/21 0312 08/13/21 0457 09/02/21 0447  BNP 194.7* 331.3* 539.6*   Basic  Metabolic Panel: Recent Labs  Lab 09/08/21 0340 09/09/21 0440 09/10/21 0550  NA 133* 134* 131*  K 3.3* 4.2 4.3  CL 97* 97* 96*  CO2 27 26 26   GLUCOSE 182* 332* 380*  BUN 22 24* 29*  CREATININE 0.81 0.85 0.87  CALCIUM 8.8* 9.0 9.3  MG 1.7  --   --    Liver Function Tests: No results for input(s): AST, ALT, ALKPHOS, BILITOT, PROT, ALBUMIN in the last 168 hours. No results for input(s): LIPASE, AMYLASE in the last 168 hours. No results for input(s): AMMONIA in the last 168 hours. CBC: Recent Labs  Lab 09/09/21 0440  WBC 9.1  HGB 9.1*  HCT 29.0*  MCV 97.3  PLT 481*   Cardiac Enzymes: No results for input(s): CKTOTAL, CKMB, CKMBINDEX, TROPONINI in the last 168 hours. BNP: Invalid input(s):  POCBNP CBG: Recent Labs  Lab 09/10/21 1311 09/10/21 1658 09/10/21 2048 09/11/21 0748 09/11/21 1128  GLUCAP 305* 124* 172* 155* 316*   D-Dimer No results for input(s): DDIMER in the last 72 hours. Hgb A1c No results for input(s): HGBA1C in the last 72 hours. Lipid Profile No results for input(s): CHOL, HDL, LDLCALC, TRIG, CHOLHDL, LDLDIRECT in the last 72 hours. Thyroid function studies No results for input(s): TSH, T4TOTAL, T3FREE, THYROIDAB in the last 72 hours.  Invalid input(s): FREET3 Anemia work up No results for input(s): VITAMINB12, FOLATE, FERRITIN, TIBC, IRON, RETICCTPCT in the last 72 hours. Urinalysis    Component Value Date/Time   COLORURINE YELLOW 08/04/2021 0746   APPEARANCEUR CLEAR 08/04/2021 0746   LABSPEC 1.016 08/04/2021 0746   PHURINE 6.0 08/04/2021 0746   GLUCOSEU NEGATIVE 08/04/2021 0746   GLUCOSEU 100 (A) 08/13/2020 1550   HGBUR NEGATIVE 08/04/2021 0746   BILIRUBINUR NEGATIVE 08/04/2021 0746   KETONESUR NEGATIVE 08/04/2021 0746   PROTEINUR 100 (A) 08/04/2021 0746   UROBILINOGEN 0.2 08/13/2020 1550   NITRITE NEGATIVE 08/04/2021 0746   LEUKOCYTESUR SMALL (A) 08/04/2021 0746   Sepsis Labs Invalid input(s): PROCALCITONIN,  WBC,  LACTICIDVEN Microbiology No results found for this or any previous visit (from the past 240 hour(s)).   Time coordinating discharge: 39 minutes.   SIGNED:   Hosie Poisson, MD  [facility]

## 2021-09-15 ENCOUNTER — Telehealth: Payer: Self-pay

## 2021-09-15 DIAGNOSIS — I214 Non-ST elevation (NSTEMI) myocardial infarction: Secondary | ICD-10-CM | POA: Diagnosis not present

## 2021-09-15 DIAGNOSIS — I152 Hypertension secondary to endocrine disorders: Secondary | ICD-10-CM | POA: Diagnosis not present

## 2021-09-15 DIAGNOSIS — I5043 Acute on chronic combined systolic (congestive) and diastolic (congestive) heart failure: Secondary | ICD-10-CM | POA: Diagnosis not present

## 2021-09-15 DIAGNOSIS — E1159 Type 2 diabetes mellitus with other circulatory complications: Secondary | ICD-10-CM | POA: Diagnosis not present

## 2021-09-15 DIAGNOSIS — I25110 Atherosclerotic heart disease of native coronary artery with unstable angina pectoris: Secondary | ICD-10-CM | POA: Diagnosis not present

## 2021-09-15 DIAGNOSIS — I350 Nonrheumatic aortic (valve) stenosis: Secondary | ICD-10-CM | POA: Diagnosis not present

## 2021-09-15 NOTE — Telephone Encounter (Signed)
Transition Care Management Follow-up Telephone Call Date of discharge and from where: [facility] 09-11-21 Dx: dyslipidemia How have you been since you were released from the hospital? Feels a little stronger  Any questions or concerns? No  Items Reviewed: Did the pt receive and understand the discharge instructions provided? Yes  Medications obtained and verified? yes Other? No  Any new allergies since your discharge? No  Dietary orders reviewed? Yes Do you have support at home? Yes   Home Care and Equipment/Supplies: Were home health services ordered? yes If so, what is the name of the agency? [facility]  Has the agency set up a time to come to the patient's home? yes Were any new equipment or medical supplies ordered?  No What is the name of the medical supply agency? [facility] Were you able to get the supplies/equipment? not applicable Do you have any questions related to the use of the equipment or supplies? No  Functional Questionnaire: (I = Independent and D = Dependent) ADLs: I  Bathing/Dressing- D  Meal Prep- D  Eating- I  Maintaining continence- I  Transferring/Ambulation- D  Managing Meds- I  Follow up appointments reviewed:  PCP Hospital f/u appt confirmed? Yes  Scheduled to see Dr Ronnald Ramp on 09-22-21 @ [facility] f/u appt confirmed? Yes  Scheduled to see [name] on 10-03-21 @ 10am. Are transportation arrangements needed? No  If their condition worsens, is the pt aware to call PCP or go to the Emergency Dept.? Yes Was the patient provided with contact information for the PCP's office or ED? Yes Was to pt encouraged to call back with questions or concerns? Yes

## 2021-09-16 ENCOUNTER — Telehealth: Payer: Self-pay

## 2021-09-16 ENCOUNTER — Telehealth: Payer: Self-pay | Admitting: Internal Medicine

## 2021-09-16 DIAGNOSIS — I5043 Acute on chronic combined systolic (congestive) and diastolic (congestive) heart failure: Secondary | ICD-10-CM | POA: Diagnosis not present

## 2021-09-16 DIAGNOSIS — I350 Nonrheumatic aortic (valve) stenosis: Secondary | ICD-10-CM | POA: Diagnosis not present

## 2021-09-16 DIAGNOSIS — I152 Hypertension secondary to endocrine disorders: Secondary | ICD-10-CM | POA: Diagnosis not present

## 2021-09-16 DIAGNOSIS — I214 Non-ST elevation (NSTEMI) myocardial infarction: Secondary | ICD-10-CM | POA: Diagnosis not present

## 2021-09-16 DIAGNOSIS — J9611 Chronic respiratory failure with hypoxia: Secondary | ICD-10-CM

## 2021-09-16 DIAGNOSIS — E1159 Type 2 diabetes mellitus with other circulatory complications: Secondary | ICD-10-CM | POA: Diagnosis not present

## 2021-09-16 DIAGNOSIS — I25110 Atherosclerotic heart disease of native coronary artery with unstable angina pectoris: Secondary | ICD-10-CM | POA: Diagnosis not present

## 2021-09-16 DIAGNOSIS — I5032 Chronic diastolic (congestive) heart failure: Secondary | ICD-10-CM | POA: Diagnosis not present

## 2021-09-16 NOTE — Telephone Encounter (Signed)
Caller, pt's h[facility] nurse w/ [facility] states pt is having urinary urgency and burning w/ urination  Caller requesting order for urinalysis    Caller requesting rx for humidified oxygen concentrator fax to [facility]  Fax [phone]   Phone [phone] - Katie [facility] Nurse)

## 2021-09-16 NOTE — Telephone Encounter (Signed)
Katherine [name] calling to request PT orders.  1 time a week for 8 weeks  [facility] CB is [phone]

## 2021-09-17 NOTE — Telephone Encounter (Signed)
Verbal orders given to [facility] for UA.   DME ordered. Community message sent to [facility] in regard.

## 2021-09-17 NOTE — Telephone Encounter (Signed)
Called Nigel, LVM stating ok for verbals.

## 2021-09-19 ENCOUNTER — Telehealth: Payer: Self-pay

## 2021-09-19 NOTE — Chronic Care Management (AMB) (Signed)
Chronic Care Management Pharmacy Assistant   Name: Katherine [name]  MRN: [id number] DOB: 1955/09/03   Reason for Encounter: Disease State-General  Appt: 11/18/2021 @ 9am  Recent office visits:  None ID  Recent consult visits:  None ID  Hospital visits:  Medication Reconciliation was completed by comparing discharge summary, patients EMR and Pharmacy list, and upon discussion with patient.  Admitted to the hospital on 09/02/21 due to SOB. Discharge date was 09/11/21. Discharged from [facility].    Medications Discontinued at Hospital Discharge: -Stopped clotrimazole, magnesium, and metoprolol   Medications that remain the same after Hospital Discharge:??  -All other medications will remain the same.    Admitted to the hospital on 08/25/21 due to SOB. Discharge date was 09/02/21. Discharged from [facility].    Medications that remain the same after Hospital Discharge:??  -All other medications will remain the same.    Admitted to the hospital on 08/04/21 due to Congress. Discharge date was 08/25/21. Discharged from [facility].    Medications Discontinued at Hospital Discharge: -Stopped: isosorbide 60 mg and metoprolol 100 mg  Medications that remain the same after Hospital Discharge:??  -All other medications will remain the same.    Medications: Outpatient Encounter Medications as of 09/19/2021  Medication Sig Note   acetaminophen (TYLENOL) 500 MG tablet Take 1,000 mg by mouth daily.    albuterol (VENTOLIN HFA) 108 (90 Base) MCG/ACT inhaler TAKE 2 PUFFS BY MOUTH EVERY 6 HOURS AS NEEDED FOR WHEEZE OR SHORTNESS OF BREATH (Patient taking differently: 2 puffs every 6 (six) hours as needed for wheezing or shortness of breath.)    apixaban (ELIQUIS) 5 MG TABS tablet Take 1 tablet (5 mg total) by mouth 2 (two) times daily. APPOINTMENT NEEDED WITH CARDIOLOGIST FOR FURTHER REFILLS (Patient taking differently: Take 5 mg by mouth 2  (two) times daily.)    arformoterol (BROVANA) 15 MCG/2ML NEBU Take 2 mLs (15 mcg total) by nebulization 2 (two) times daily. 08/04/2021: Need RF   ascorbic acid (VITAMIN C) 500 MG tablet Take 1 tablet (500 mg total) by mouth daily. (Patient not taking: Reported on 09/15/2021)    azaTHIOprine (IMURAN) 50 MG tablet Take 125 mg by mouth See admin instructions. Take 2 1/2 tablets (125 mg) by mouth daily at 3pm    budesonide (PULMICORT) 0.5 MG/2ML nebulizer solution Take 2 mLs (0.5 mg total) by nebulization 2 (two) times daily.    calcitRIOL (ROCALTROL) 0.25 MCG capsule Take 0.25 mcg by mouth every 3 (three) days.     cetirizine (ZYRTEC) 5 MG tablet Take 5 mg by mouth daily.    clopidogrel (PLAVIX) 75 MG tablet Take 1 tablet (75 mg total) by mouth daily.    cyclobenzaprine (FLEXERIL) 10 MG tablet Take 1 tablet (10 mg total) by mouth 3 (three) times daily as needed for muscle spasms.    Ensure Max Protein (ENSURE MAX PROTEIN) LIQD Take 330 mLs (11 oz total) by mouth 3 (three) times daily between meals.    fluticasone (FLONASE) 50 MCG/ACT nasal spray PLACE 2 SPRAYS INTO BOTH NOSTRILS 2 TIMES DAILY. (Patient taking differently: 2 sprays 2 (two) times daily as needed for allergies.)    gabapentin (NEURONTIN) 100 MG capsule Take 1 capsule (100 mg total) by mouth at bedtime.    glimepiride (AMARYL) 4 MG tablet Take 4 mg by mouth daily.    hydrocortisone (ANUSOL-HC) 25 MG suppository Place 1 suppository (25 mg total) rectally 2 (two) times daily.  insulin aspart (NOVOLOG FLEXPEN) 100 UNIT/ML FlexPen Inject 4-10 Units into the skin 3 (three) times daily with meals. Per sliding scale :not provided    Insulin Glargine (BASAGLAR KWIKPEN) 100 UNIT/ML Inject 20 Units into the skin daily before breakfast.    isosorbide mononitrate (IMDUR) 30 MG 24 hr tablet Take 1 tablet (30 mg total) by mouth daily.    lamoTRIgine (LAMICTAL) 25 MG tablet TAKE 1 TABLET BY MOUTH EVERYDAY AT BEDTIME (Patient not taking: Reported on  09/15/2021)    linaclotide (LINZESS) 145 MCG CAPS capsule Take 1 capsule (145 mcg total) by mouth daily before breakfast.    LORazepam (ATIVAN) 1 MG tablet Take 0.5 tablets (0.5 mg total) by mouth 2 (two) times daily as needed for anxiety.    metFORMIN (GLUCOPHAGE) 500 MG tablet Take 1 tablet (500 mg total) by mouth 2 (two) times daily.    metoprolol tartrate (LOPRESSOR) 25 MG tablet Take 1 tablet (25 mg total) by mouth 3 (three) times daily.    montelukast (SINGULAIR) 10 MG tablet TAKE 1 TABLET BY MOUTH EVERYDAY AT BEDTIME (Patient taking differently: Take 10 mg by mouth at bedtime.)    Multiple Vitamin (MULTIVITAMIN WITH MINERALS) TABS tablet Take 1 tablet by mouth daily.    nitroGLYCERIN (NITROSTAT) 0.4 MG SL tablet Place 1 tablet (0.4 mg total) under the tongue every 5 (five) minutes as needed for chest pain.    nutrition supplement, JUVEN, (JUVEN) PACK Take 1 packet by mouth 2 (two) times daily between meals.    Nutritional Supplements (,FEEDING SUPPLEMENT, PROSOURCE PLUS) liquid Take 30 mLs by mouth 2 (two) times daily between meals.    nystatin (MYCOSTATIN/NYSTOP) powder Apply topically 3 (three) times daily.    Oxycodone HCl 10 MG TABS Take 1 tablet (10 mg total) by mouth 4 (four) times daily as needed. (Patient taking differently: Take 10 mg by mouth 4 (four) times daily as needed (pain).)    OXYGEN Inhale 3 L into the lungs continuous.    pantoprazole (PROTONIX) 40 MG tablet TAKE 1 TABLET BY MOUTH EVERY DAY (Patient taking differently: 40 mg daily.)    polyethylene glycol (MIRALAX / GLYCOLAX) 17 g packet Take 17 g by mouth 2 (two) times daily.    predniSONE (DELTASONE) 10 MG tablet Take 15mg  po daily for 3 days then 10mg  po daily for 3 days then 5mg  daily    promethazine (PHENERGAN) 25 MG tablet Take 25 mg by mouth every 6 (six) hours as needed for nausea or vomiting.    ranolazine (RANEXA) 500 MG 12 hr tablet Take 1 tablet (500 mg total) by mouth 2 (two) times daily.    Respiratory  Therapy Supplies (NEBULIZER) DEVI Pt needs nebulizr machine dx J44.9    rosuvastatin (CRESTOR) 20 MG tablet Take 1 tablet (20 mg total) by mouth daily.    senna-docusate (SENOKOT-S) 8.6-50 MG tablet Take 2 tablets by mouth 2 (two) times daily.    sertraline (ZOLOFT) 100 MG tablet Take 1 tablet (100 mg total) by mouth daily.    zinc sulfate 220 (50 Zn) MG capsule Take 1 capsule (220 mg total) by mouth daily.    No facility-administered encounter medications on file as of 09/19/2021.   Have you had any problems recently with your health?Patient states that she has been in and out the hospital since December. She states that her sob is getting better but still experiencing some chest pain. She stated that she takes nitroglycerin when having chest pain. Patient states that she is weak  and only has enough strength to get up and go to the rest room.  Have you had any problems with your pharmacy?Patient states that she is not having any problems with getting medications or the cost of medications from the pharmacy.  What issues or side effects are you having with your medications?Patient states that she does not have any side effects from medications  What would you like me to pass along to  [facility] for them to help you with? Patient stated that she was told in the hospital that she could go back on prednisone 5 mg when she got home so she needs a refill and also a refill for her Protonix  What can we do to take care of you better? Patient states that it would help if she can get a refill on her medications  Care Gaps: Colonoscopy-12/03/16 Diabetic Foot Exam-08/13/20 Mammogram-NA Ophthalmology-10/31/20 Dexa Scan - NA Annual Well Visit - NA Micro albumin-NA Hemoglobin A1c- 08/11/21  Star Rating Drugs: Rosuvastatin 20 mg-last fill 09/10/21 90 ds   Ethelene Hal Clinical Pharmacist Assistant [phone]

## 2021-09-20 ENCOUNTER — Inpatient Hospital Stay (HOSPITAL_COMMUNITY)
Admission: EM | Admit: 2021-09-20 | Discharge: 2021-09-26 | DRG: 280 | Disposition: A | Discharge status: Hospice | Payer: Medicare Other | Attending: Internal Medicine | Admitting: Internal Medicine

## 2021-09-20 ENCOUNTER — Encounter (HOSPITAL_COMMUNITY): Payer: Self-pay | Admitting: Emergency Medicine

## 2021-09-20 ENCOUNTER — Emergency Department (HOSPITAL_COMMUNITY): Payer: Medicare Other

## 2021-09-20 ENCOUNTER — Other Ambulatory Visit: Payer: Self-pay

## 2021-09-20 DIAGNOSIS — D132 Benign neoplasm of duodenum: Secondary | ICD-10-CM | POA: Diagnosis present

## 2021-09-20 DIAGNOSIS — Z1619 Resistance to other specified beta lactam antibiotics: Secondary | ICD-10-CM | POA: Diagnosis present

## 2021-09-20 DIAGNOSIS — E1151 Type 2 diabetes mellitus with diabetic peripheral angiopathy without gangrene: Secondary | ICD-10-CM | POA: Diagnosis present

## 2021-09-20 DIAGNOSIS — Z955 Presence of coronary angioplasty implant and graft: Secondary | ICD-10-CM

## 2021-09-20 DIAGNOSIS — B9620 Unspecified Escherichia coli [E. coli] as the cause of diseases classified elsewhere: Secondary | ICD-10-CM | POA: Diagnosis not present

## 2021-09-20 DIAGNOSIS — I5023 Acute on chronic systolic (congestive) heart failure: Secondary | ICD-10-CM | POA: Diagnosis not present

## 2021-09-20 DIAGNOSIS — K219 Gastro-esophageal reflux disease without esophagitis: Secondary | ICD-10-CM | POA: Diagnosis present

## 2021-09-20 DIAGNOSIS — N390 Urinary tract infection, site not specified: Secondary | ICD-10-CM | POA: Diagnosis present

## 2021-09-20 DIAGNOSIS — I517 Cardiomegaly: Secondary | ICD-10-CM | POA: Diagnosis not present

## 2021-09-20 DIAGNOSIS — I5033 Acute on chronic diastolic (congestive) heart failure: Secondary | ICD-10-CM | POA: Diagnosis present

## 2021-09-20 DIAGNOSIS — Z8249 Family history of ischemic heart disease and other diseases of the circulatory system: Secondary | ICD-10-CM

## 2021-09-20 DIAGNOSIS — Z8616 Personal history of COVID-19: Secondary | ICD-10-CM

## 2021-09-20 DIAGNOSIS — I739 Peripheral vascular disease, unspecified: Secondary | ICD-10-CM | POA: Diagnosis not present

## 2021-09-20 DIAGNOSIS — F419 Anxiety disorder, unspecified: Secondary | ICD-10-CM | POA: Diagnosis not present

## 2021-09-20 DIAGNOSIS — I509 Heart failure, unspecified: Secondary | ICD-10-CM | POA: Diagnosis not present

## 2021-09-20 DIAGNOSIS — Z7951 Long term (current) use of inhaled steroids: Secondary | ICD-10-CM

## 2021-09-20 DIAGNOSIS — Z20822 Contact with and (suspected) exposure to covid-19: Secondary | ICD-10-CM | POA: Diagnosis present

## 2021-09-20 DIAGNOSIS — E876 Hypokalemia: Secondary | ICD-10-CM | POA: Diagnosis present

## 2021-09-20 DIAGNOSIS — Z9884 Bariatric surgery status: Secondary | ICD-10-CM

## 2021-09-20 DIAGNOSIS — E1165 Type 2 diabetes mellitus with hyperglycemia: Secondary | ICD-10-CM | POA: Diagnosis present

## 2021-09-20 DIAGNOSIS — Z79899 Other long term (current) drug therapy: Secondary | ICD-10-CM

## 2021-09-20 DIAGNOSIS — E1169 Type 2 diabetes mellitus with other specified complication: Secondary | ICD-10-CM | POA: Diagnosis present

## 2021-09-20 DIAGNOSIS — I4811 Longstanding persistent atrial fibrillation: Secondary | ICD-10-CM | POA: Diagnosis present

## 2021-09-20 DIAGNOSIS — E785 Hyperlipidemia, unspecified: Secondary | ICD-10-CM | POA: Diagnosis present

## 2021-09-20 DIAGNOSIS — Z885 Allergy status to narcotic agent status: Secondary | ICD-10-CM

## 2021-09-20 DIAGNOSIS — I152 Hypertension secondary to endocrine disorders: Secondary | ICD-10-CM | POA: Diagnosis present

## 2021-09-20 DIAGNOSIS — M069 Rheumatoid arthritis, unspecified: Secondary | ICD-10-CM | POA: Diagnosis present

## 2021-09-20 DIAGNOSIS — F32A Depression, unspecified: Secondary | ICD-10-CM | POA: Diagnosis not present

## 2021-09-20 DIAGNOSIS — Z882 Allergy status to sulfonamides status: Secondary | ICD-10-CM

## 2021-09-20 DIAGNOSIS — Z87442 Personal history of urinary calculi: Secondary | ICD-10-CM

## 2021-09-20 DIAGNOSIS — I110 Hypertensive heart disease with heart failure: Secondary | ICD-10-CM | POA: Diagnosis not present

## 2021-09-20 DIAGNOSIS — Z7960 Long term (current) use of unspecified immunomodulators and immunosuppressants: Secondary | ICD-10-CM

## 2021-09-20 DIAGNOSIS — I252 Old myocardial infarction: Secondary | ICD-10-CM

## 2021-09-20 DIAGNOSIS — Z66 Do not resuscitate: Secondary | ICD-10-CM | POA: Diagnosis present

## 2021-09-20 DIAGNOSIS — Z515 Encounter for palliative care: Secondary | ICD-10-CM | POA: Diagnosis not present

## 2021-09-20 DIAGNOSIS — I25112 Atherosclerotic heart disease of native coronary artery with refractory angina pectoris: Secondary | ICD-10-CM | POA: Diagnosis present

## 2021-09-20 DIAGNOSIS — G8929 Other chronic pain: Secondary | ICD-10-CM | POA: Diagnosis present

## 2021-09-20 DIAGNOSIS — I4891 Unspecified atrial fibrillation: Secondary | ICD-10-CM | POA: Diagnosis not present

## 2021-09-20 DIAGNOSIS — Z794 Long term (current) use of insulin: Secondary | ICD-10-CM

## 2021-09-20 DIAGNOSIS — R0789 Other chest pain: Secondary | ICD-10-CM | POA: Diagnosis not present

## 2021-09-20 DIAGNOSIS — E871 Hypo-osmolality and hyponatremia: Secondary | ICD-10-CM | POA: Diagnosis present

## 2021-09-20 DIAGNOSIS — Z951 Presence of aortocoronary bypass graft: Secondary | ICD-10-CM

## 2021-09-20 DIAGNOSIS — Z8614 Personal history of Methicillin resistant Staphylococcus aureus infection: Secondary | ICD-10-CM

## 2021-09-20 DIAGNOSIS — D539 Nutritional anemia, unspecified: Secondary | ICD-10-CM | POA: Diagnosis present

## 2021-09-20 DIAGNOSIS — Z8673 Personal history of transient ischemic attack (TIA), and cerebral infarction without residual deficits: Secondary | ICD-10-CM

## 2021-09-20 DIAGNOSIS — R0602 Shortness of breath: Secondary | ICD-10-CM | POA: Diagnosis not present

## 2021-09-20 DIAGNOSIS — J811 Chronic pulmonary edema: Secondary | ICD-10-CM | POA: Diagnosis not present

## 2021-09-20 DIAGNOSIS — E669 Obesity, unspecified: Secondary | ICD-10-CM | POA: Diagnosis present

## 2021-09-20 DIAGNOSIS — Z940 Kidney transplant status: Secondary | ICD-10-CM | POA: Diagnosis not present

## 2021-09-20 DIAGNOSIS — I25110 Atherosclerotic heart disease of native coronary artery with unstable angina pectoris: Secondary | ICD-10-CM | POA: Diagnosis not present

## 2021-09-20 DIAGNOSIS — J438 Other emphysema: Secondary | ICD-10-CM | POA: Diagnosis not present

## 2021-09-20 DIAGNOSIS — Z6835 Body mass index (BMI) 35.0-35.9, adult: Secondary | ICD-10-CM

## 2021-09-20 DIAGNOSIS — I491 Atrial premature depolarization: Secondary | ICD-10-CM | POA: Diagnosis not present

## 2021-09-20 DIAGNOSIS — Z7901 Long term (current) use of anticoagulants: Secondary | ICD-10-CM

## 2021-09-20 DIAGNOSIS — I10 Essential (primary) hypertension: Secondary | ICD-10-CM | POA: Diagnosis not present

## 2021-09-20 DIAGNOSIS — Z7189 Other specified counseling: Secondary | ICD-10-CM | POA: Diagnosis not present

## 2021-09-20 DIAGNOSIS — I480 Paroxysmal atrial fibrillation: Secondary | ICD-10-CM | POA: Diagnosis present

## 2021-09-20 DIAGNOSIS — E1142 Type 2 diabetes mellitus with diabetic polyneuropathy: Secondary | ICD-10-CM | POA: Diagnosis not present

## 2021-09-20 DIAGNOSIS — R0600 Dyspnea, unspecified: Secondary | ICD-10-CM | POA: Diagnosis not present

## 2021-09-20 DIAGNOSIS — I214 Non-ST elevation (NSTEMI) myocardial infarction: Secondary | ICD-10-CM | POA: Diagnosis present

## 2021-09-20 DIAGNOSIS — J189 Pneumonia, unspecified organism: Secondary | ICD-10-CM | POA: Diagnosis not present

## 2021-09-20 DIAGNOSIS — J9621 Acute and chronic respiratory failure with hypoxia: Secondary | ICD-10-CM | POA: Diagnosis present

## 2021-09-20 DIAGNOSIS — J449 Chronic obstructive pulmonary disease, unspecified: Secondary | ICD-10-CM | POA: Diagnosis present

## 2021-09-20 DIAGNOSIS — R079 Chest pain, unspecified: Secondary | ICD-10-CM | POA: Diagnosis not present

## 2021-09-20 DIAGNOSIS — R0902 Hypoxemia: Secondary | ICD-10-CM | POA: Diagnosis not present

## 2021-09-20 DIAGNOSIS — Z91048 Other nonmedicinal substance allergy status: Secondary | ICD-10-CM

## 2021-09-20 DIAGNOSIS — R300 Dysuria: Secondary | ICD-10-CM | POA: Insufficient documentation

## 2021-09-20 DIAGNOSIS — Z9049 Acquired absence of other specified parts of digestive tract: Secondary | ICD-10-CM

## 2021-09-20 DIAGNOSIS — I447 Left bundle-branch block, unspecified: Secondary | ICD-10-CM | POA: Diagnosis present

## 2021-09-20 DIAGNOSIS — M797 Fibromyalgia: Secondary | ICD-10-CM | POA: Diagnosis present

## 2021-09-20 DIAGNOSIS — F17210 Nicotine dependence, cigarettes, uncomplicated: Secondary | ICD-10-CM | POA: Diagnosis present

## 2021-09-20 DIAGNOSIS — I2510 Atherosclerotic heart disease of native coronary artery without angina pectoris: Secondary | ICD-10-CM | POA: Diagnosis not present

## 2021-09-20 DIAGNOSIS — Z9981 Dependence on supplemental oxygen: Secondary | ICD-10-CM

## 2021-09-20 DIAGNOSIS — Z7902 Long term (current) use of antithrombotics/antiplatelets: Secondary | ICD-10-CM

## 2021-09-20 DIAGNOSIS — Z7984 Long term (current) use of oral hypoglycemic drugs: Secondary | ICD-10-CM

## 2021-09-20 DIAGNOSIS — T380X5A Adverse effect of glucocorticoids and synthetic analogues, initial encounter: Secondary | ICD-10-CM | POA: Diagnosis present

## 2021-09-20 DIAGNOSIS — Z888 Allergy status to other drugs, medicaments and biological substances status: Secondary | ICD-10-CM

## 2021-09-20 DIAGNOSIS — E66812 Obesity, class 2: Secondary | ICD-10-CM

## 2021-09-20 DIAGNOSIS — Z881 Allergy status to other antibiotic agents status: Secondary | ICD-10-CM

## 2021-09-20 DIAGNOSIS — E118 Type 2 diabetes mellitus with unspecified complications: Secondary | ICD-10-CM | POA: Diagnosis not present

## 2021-09-20 LAB — CBC WITH DIFFERENTIAL/PLATELET
Abs Immature Granulocytes: 0.12 10*3/uL — ABNORMAL HIGH (ref 0.00–0.07)
Basophils Absolute: 0.1 10*3/uL (ref 0.0–0.1)
Basophils Relative: 0 %
Eosinophils Absolute: 0.3 10*3/uL (ref 0.0–0.5)
Eosinophils Relative: 2 %
HCT: 29.4 % — ABNORMAL LOW (ref 36.0–46.0)
Hemoglobin: 9.2 g/dL — ABNORMAL LOW (ref 12.0–15.0)
Immature Granulocytes: 1 %
Lymphocytes Relative: 7 %
Lymphs Abs: 1.1 10*3/uL (ref 0.7–4.0)
MCH: 29.1 pg (ref 26.0–34.0)
MCHC: 31.3 g/dL (ref 30.0–36.0)
MCV: 93 fL (ref 80.0–100.0)
Monocytes Absolute: 1.1 10*3/uL — ABNORMAL HIGH (ref 0.1–1.0)
Monocytes Relative: 8 %
Neutro Abs: 12.2 10*3/uL — ABNORMAL HIGH (ref 1.7–7.7)
Neutrophils Relative %: 82 %
Platelets: 409 10*3/uL — ABNORMAL HIGH (ref 150–400)
RBC: 3.16 MIL/uL — ABNORMAL LOW (ref 3.87–5.11)
RDW: 17.5 % — ABNORMAL HIGH (ref 11.5–15.5)
WBC: 14.8 10*3/uL — ABNORMAL HIGH (ref 4.0–10.5)
nRBC: 0 % (ref 0.0–0.2)

## 2021-09-20 LAB — GLUCOSE, CAPILLARY
Glucose-Capillary: 273 mg/dL — ABNORMAL HIGH (ref 70–99)
Glucose-Capillary: 257 mg/dL — ABNORMAL HIGH (ref 70–99)

## 2021-09-20 LAB — BASIC METABOLIC PANEL
Anion gap: 14 (ref 5–15)
BUN: 16 mg/dL (ref 8–23)
CO2: 25 mmol/L (ref 22–32)
Calcium: 9.1 mg/dL (ref 8.9–10.3)
Chloride: 96 mmol/L — ABNORMAL LOW (ref 98–111)
Creatinine, Ser: 0.89 mg/dL (ref 0.44–1.00)
GFR, Estimated: >60 mL/min (ref >60)
Glucose, Bld: 216 mg/dL — ABNORMAL HIGH (ref 70–99)
Potassium: 3.2 mmol/L — ABNORMAL LOW (ref 3.5–5.1)
Sodium: 135 mmol/L (ref 135–145)

## 2021-09-20 LAB — URINALYSIS, MICROSCOPIC (REFLEX): WBC, UA: >50 WBC/hpf (ref 0–5)

## 2021-09-20 LAB — BRAIN NATRIURETIC PEPTIDE: B Natriuretic Peptide: 423.8 pg/mL — ABNORMAL HIGH (ref 0.0–100.0)

## 2021-09-20 LAB — RESPIRATORY PANEL BY PCR

## 2021-09-20 LAB — URINALYSIS, ROUTINE W REFLEX MICROSCOPIC
Bilirubin Urine: NEGATIVE
Glucose, UA: NEGATIVE mg/dL
Hgb urine dipstick: NEGATIVE
Ketones, ur: NEGATIVE mg/dL
Nitrite: POSITIVE — AB
Protein, ur: NEGATIVE mg/dL
Specific Gravity, Urine: 1.015 (ref 1.005–1.030)
pH: 6 (ref 5.0–8.0)

## 2021-09-20 LAB — TROPONIN I (HIGH SENSITIVITY)
Troponin I (High Sensitivity): 278 ng/L
Troponin I (High Sensitivity): 366 ng/L
Troponin I (High Sensitivity): 457 ng/L (ref <18)
Troponin I (High Sensitivity): 563 ng/L (ref <18)

## 2021-09-20 LAB — LACTIC ACID, PLASMA
Lactic Acid, Venous: 1.8 mmol/L (ref 0.5–1.9)
Lactic Acid, Venous: 2.4 mmol/L (ref 0.5–1.9)

## 2021-09-20 LAB — MAGNESIUM: Magnesium: 1.3 mg/dL — ABNORMAL LOW (ref 1.7–2.4)

## 2021-09-20 LAB — PROCALCITONIN: Procalcitonin: <0.1 ng/mL

## 2021-09-20 MED ORDER — INSULIN GLARGINE-YFGN 100 UNIT/ML ~~LOC~~ SOLN
12.0000 [IU] | Freq: Every day | SUBCUTANEOUS | Status: DC
  Administered 2021-09-21: 12 [IU] via SUBCUTANEOUS
  Filled 2021-09-20 (×2): qty 0.12

## 2021-09-20 MED ORDER — POTASSIUM CHLORIDE 10 MEQ/100ML IV SOLN
10.0000 meq | INTRAVENOUS | Status: AC
  Administered 2021-09-20 (×2): 10 meq via INTRAVENOUS
  Filled 2021-09-20 (×2): qty 100

## 2021-09-20 MED ORDER — BASAGLAR KWIKPEN 100 UNIT/ML ~~LOC~~ SOPN
12.0000 [IU] | PEN_INJECTOR | Freq: Every day | SUBCUTANEOUS | Status: DC
Start: 2021-09-20 — End: 2021-09-20

## 2021-09-20 MED ORDER — CLOPIDOGREL BISULFATE 75 MG PO TABS
75.0000 mg | ORAL_TABLET | Freq: Every day | ORAL | Status: DC
  Administered 2021-09-21 – 2021-09-26 (×6): 75 mg via ORAL
  Filled 2021-09-20 (×6): qty 1

## 2021-09-20 MED ORDER — NITROGLYCERIN 0.4 MG SL SUBL: 0.4000 mg | SUBLINGUAL_TABLET | SUBLINGUAL | Status: DC | PRN

## 2021-09-20 MED ORDER — ISOSORBIDE MONONITRATE ER 30 MG PO TB24: 30.0000 mg | ORAL_TABLET | Freq: Every day | ORAL | Status: DC

## 2021-09-20 MED ORDER — ACETAMINOPHEN 650 MG RE SUPP: 650.0000 mg | Freq: Four times a day (QID) | RECTAL | Status: DC | PRN

## 2021-09-20 MED ORDER — OXYCODONE HCL 5 MG PO TABS
10.0000 mg | ORAL_TABLET | Freq: Four times a day (QID) | ORAL | Status: DC | PRN
  Administered 2021-09-20 – 2021-09-24 (×9): 10 mg via ORAL
  Filled 2021-09-20 (×9): qty 2

## 2021-09-20 MED ORDER — ACETAMINOPHEN 325 MG PO TABS: 650.0000 mg | ORAL_TABLET | Freq: Four times a day (QID) | ORAL | Status: DC | PRN

## 2021-09-20 MED ORDER — FUROSEMIDE 10 MG/ML IJ SOLN
40.0000 mg | Freq: Once | INTRAMUSCULAR | Status: AC
  Administered 2021-09-20: 40 mg via INTRAVENOUS
  Filled 2021-09-20: qty 4

## 2021-09-20 MED ORDER — LAMOTRIGINE 25 MG PO TABS
25.0000 mg | ORAL_TABLET | Freq: Every day | ORAL | Status: DC
  Administered 2021-09-20 – 2021-09-25 (×6): 25 mg via ORAL
  Filled 2021-09-20 (×7): qty 1

## 2021-09-20 MED ORDER — PREDNISONE 20 MG PO TABS
40.0000 mg | ORAL_TABLET | Freq: Every day | ORAL | Status: DC
  Administered 2021-09-21: 40 mg via ORAL
  Filled 2021-09-20: qty 2

## 2021-09-20 MED ORDER — METOPROLOL TARTRATE 25 MG PO TABS
25.0000 mg | ORAL_TABLET | Freq: Three times a day (TID) | ORAL | Status: DC
  Administered 2021-09-20 – 2021-09-26 (×19): 25 mg via ORAL
  Filled 2021-09-20 (×19): qty 1

## 2021-09-20 MED ORDER — ONDANSETRON HCL 4 MG/2ML IJ SOLN
4.0000 mg | Freq: Four times a day (QID) | INTRAMUSCULAR | Status: DC | PRN
  Administered 2021-09-25 (×2): 4 mg via INTRAVENOUS
  Filled 2021-09-20 (×2): qty 2

## 2021-09-20 MED ORDER — AZATHIOPRINE 50 MG PO TABS
125.0000 mg | ORAL_TABLET | Freq: Every day | ORAL | Status: DC
  Administered 2021-09-21 – 2021-09-26 (×6): 125 mg via ORAL
  Filled 2021-09-20 (×7): qty 3

## 2021-09-20 MED ORDER — IPRATROPIUM-ALBUTEROL 0.5-2.5 (3) MG/3ML IN SOLN
3.0000 mL | Freq: Four times a day (QID) | RESPIRATORY_TRACT | Status: AC
  Administered 2021-09-20 – 2021-09-21 (×3): 3 mL via RESPIRATORY_TRACT
  Filled 2021-09-20 (×4): qty 3

## 2021-09-20 MED ORDER — INSULIN ASPART 100 UNIT/ML IJ SOLN
0.0000 [IU] | Freq: Three times a day (TID) | INTRAMUSCULAR | Status: DC
  Administered 2021-09-20: 8 [IU] via SUBCUTANEOUS
  Administered 2021-09-21 (×2): 15 [IU] via SUBCUTANEOUS
  Administered 2021-09-21: 11 [IU] via SUBCUTANEOUS
  Administered 2021-09-22: 15 [IU] via SUBCUTANEOUS
  Administered 2021-09-22 (×2): 8 [IU] via SUBCUTANEOUS
  Administered 2021-09-23: 15 [IU] via SUBCUTANEOUS
  Administered 2021-09-23: 5 [IU] via SUBCUTANEOUS
  Administered 2021-09-23: 15 [IU] via SUBCUTANEOUS
  Administered 2021-09-24: 11 [IU] via SUBCUTANEOUS
  Administered 2021-09-24: 3 [IU] via SUBCUTANEOUS
  Administered 2021-09-24 – 2021-09-25 (×2): 15 [IU] via SUBCUTANEOUS
  Administered 2021-09-25: 3 [IU] via SUBCUTANEOUS
  Administered 2021-09-25: 5 [IU] via SUBCUTANEOUS
  Administered 2021-09-26 (×2): 8 [IU] via SUBCUTANEOUS

## 2021-09-20 MED ORDER — LORAZEPAM 0.5 MG PO TABS
0.5000 mg | ORAL_TABLET | Freq: Two times a day (BID) | ORAL | Status: DC | PRN
  Administered 2021-09-20 – 2021-09-24 (×8): 0.5 mg via ORAL
  Filled 2021-09-20 (×8): qty 1

## 2021-09-20 MED ORDER — ONDANSETRON HCL 4 MG PO TABS
4.0000 mg | ORAL_TABLET | Freq: Four times a day (QID) | ORAL | Status: DC | PRN
  Administered 2021-09-21 – 2021-09-26 (×3): 4 mg via ORAL
  Filled 2021-09-20 (×3): qty 1

## 2021-09-20 MED ORDER — ONDANSETRON HCL 4 MG/2ML IJ SOLN
4.0000 mg | Freq: Once | INTRAMUSCULAR | Status: AC
  Administered 2021-09-20: 4 mg via INTRAVENOUS
  Filled 2021-09-20: qty 2

## 2021-09-20 MED ORDER — ISOSORBIDE MONONITRATE ER 60 MG PO TB24
60.0000 mg | ORAL_TABLET | Freq: Every day | ORAL | Status: DC
  Administered 2021-09-21 – 2021-09-26 (×6): 60 mg via ORAL
  Filled 2021-09-20 (×6): qty 1

## 2021-09-20 MED ORDER — SODIUM CHLORIDE 0.9% FLUSH
3.0000 mL | Freq: Two times a day (BID) | INTRAVENOUS | Status: DC
  Administered 2021-09-20 – 2021-09-26 (×9): 3 mL via INTRAVENOUS

## 2021-09-20 MED ORDER — RANOLAZINE ER 500 MG PO TB12
500.0000 mg | ORAL_TABLET | Freq: Two times a day (BID) | ORAL | Status: DC
  Filled 2021-09-20 (×2): qty 1

## 2021-09-20 MED ORDER — ROSUVASTATIN CALCIUM 20 MG PO TABS
20.0000 mg | ORAL_TABLET | Freq: Every day | ORAL | Status: DC
  Administered 2021-09-21 – 2021-09-24 (×4): 20 mg via ORAL
  Filled 2021-09-20 (×5): qty 1

## 2021-09-20 MED ORDER — SODIUM CHLORIDE 0.9 % IV SOLN
250.0000 mL | INTRAVENOUS | Status: DC | PRN
  Administered 2021-09-21 – 2021-09-22 (×2): 250 mL via INTRAVENOUS

## 2021-09-20 MED ORDER — BUDESONIDE 0.5 MG/2ML IN SUSP
2.0000 mL | Freq: Two times a day (BID) | RESPIRATORY_TRACT | Status: DC
  Administered 2021-09-20 – 2021-09-26 (×12): 0.5 mg via RESPIRATORY_TRACT
  Filled 2021-09-20 (×12): qty 2

## 2021-09-20 MED ORDER — HEPARIN (PORCINE) 25000 UT/250ML-% IV SOLN
1050.0000 [IU]/h | INTRAVENOUS | Status: DC
  Administered 2021-09-20: 22:00:00 1000 [IU]/h via INTRAVENOUS
  Administered 2021-09-21: 1050 [IU]/h via INTRAVENOUS
  Filled 2021-09-20 (×2): qty 250

## 2021-09-20 MED ORDER — METHYLPREDNISOLONE SODIUM SUCC 125 MG IJ SOLR
60.0000 mg | Freq: Two times a day (BID) | INTRAMUSCULAR | Status: AC
  Administered 2021-09-20 – 2021-09-21 (×2): 60 mg via INTRAVENOUS
  Filled 2021-09-20 (×2): qty 2

## 2021-09-20 MED ORDER — SODIUM CHLORIDE 0.9 % IV SOLN
1.0000 g | Freq: Once | INTRAVENOUS | Status: AC
  Administered 2021-09-20: 1 g via INTRAVENOUS
  Filled 2021-09-20: qty 10

## 2021-09-20 MED ORDER — IPRATROPIUM-ALBUTEROL 0.5-2.5 (3) MG/3ML IN SOLN
RESPIRATORY_TRACT | Status: AC
  Administered 2021-09-20: 3 mL via RESPIRATORY_TRACT
  Filled 2021-09-20: qty 3

## 2021-09-20 MED ORDER — MAGNESIUM SULFATE 2 GM/50ML IV SOLN
2.0000 g | Freq: Once | INTRAVENOUS | Status: AC
  Administered 2021-09-20: 2 g via INTRAVENOUS
  Filled 2021-09-20: qty 50

## 2021-09-20 MED ORDER — SERTRALINE HCL 100 MG PO TABS
100.0000 mg | ORAL_TABLET | Freq: Every day | ORAL | Status: DC
  Administered 2021-09-21 – 2021-09-26 (×6): 100 mg via ORAL
  Filled 2021-09-20 (×6): qty 1

## 2021-09-20 MED ORDER — MONTELUKAST SODIUM 10 MG PO TABS
10.0000 mg | ORAL_TABLET | Freq: Every day | ORAL | Status: DC
  Administered 2021-09-20 – 2021-09-25 (×6): 10 mg via ORAL
  Filled 2021-09-20 (×6): qty 1

## 2021-09-20 MED ORDER — ARFORMOTEROL TARTRATE 15 MCG/2ML IN NEBU
15.0000 ug | INHALATION_SOLUTION | Freq: Two times a day (BID) | RESPIRATORY_TRACT | Status: DC
  Administered 2021-09-20 – 2021-09-26 (×12): 15 ug via RESPIRATORY_TRACT
  Filled 2021-09-20 (×12): qty 2

## 2021-09-20 MED ORDER — GABAPENTIN 100 MG PO CAPS
100.0000 mg | ORAL_CAPSULE | Freq: Every day | ORAL | Status: DC
  Administered 2021-09-20 – 2021-09-25 (×6): 100 mg via ORAL
  Filled 2021-09-20 (×6): qty 1

## 2021-09-20 MED ORDER — RANOLAZINE ER 500 MG PO TB12
1000.0000 mg | ORAL_TABLET | Freq: Two times a day (BID) | ORAL | Status: DC
  Administered 2021-09-20 – 2021-09-26 (×12): 1000 mg via ORAL
  Filled 2021-09-20 (×12): qty 2

## 2021-09-20 MED ORDER — PANTOPRAZOLE SODIUM 40 MG PO TBEC
40.0000 mg | DELAYED_RELEASE_TABLET | Freq: Every day | ORAL | Status: DC
  Administered 2021-09-21 – 2021-09-26 (×6): 40 mg via ORAL
  Filled 2021-09-20 (×6): qty 1

## 2021-09-20 MED ORDER — FUROSEMIDE 10 MG/ML IJ SOLN
40.0000 mg | Freq: Every day | INTRAMUSCULAR | Status: DC
  Filled 2021-09-20: qty 4

## 2021-09-20 MED ORDER — SODIUM CHLORIDE 0.9% FLUSH: 3.0000 mL | INTRAVENOUS | Status: DC | PRN

## 2021-09-20 NOTE — ED Provider Notes (Signed)
[name] Provider Note   CSN: [id number] Arrival date & time: 09/20/21  1045     History  Chief Complaint  Patient presents with   Shortness of Breath   Chest Pain    [name] is a 66 y.o. female with an extensive medical history, which the patient has a difficult time recollecting, but is copied from 09/11/21 discharge 1 week ago below:  "History of CAD s/p CABG and PCI, bilateral carotid artery stenosis, PAD, PAF on Eliquis, COPD, chronic respiratory failure with hypoxia on home oxygen 3 L per minute, IDDM2, s/p renal transplant on immunosuppressant,  urolithiasis, anemia, history of GI bleed,fibromyalgia/chronic pain, recent hospitalization from 07/25/2021-08/25/2021, for COVID with MRSA pneumonia"  She is presenting back to the ED with concern for shortness of breath.  She reports that she began to feel suddenly more short of breath last night into this morning.  She had some chest pressure which was relieved with nitroglycerin which she took at home.  She currently has no chest pressure.  Her breathing is somewhat improved but it is still feels labored.  She wears 2 to 3 L nasal cannula constantly at home.  She arrives on 10 L NRB which was weaned currently to 6L.  She reports a chronic cough.  She reports compliance with her medications, which include both Eliquis and Plavix.  Per my review of external records, prior hospitalization there were concerns for an NSTEMI.  She underwent 2 attempts for cardiac catheterization but they were not able to achieve appropriate vascular access.  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  She has a distal fistula in her left forearm, although she is no longer on dialysis that she has a transplant patient.  She says she has had a PICC line in the left upper arm in the past which was also removed.  She is generally a very difficult access stick  HPI     Home Medications Prior to Admission  medications   Medication Sig Start Date End Date Taking? Authorizing Provider  acetaminophen (TYLENOL) 500 MG tablet Take 1,000 mg by mouth daily.    [provider]  albuterol (VENTOLIN HFA) 108 (90 Base) MCG/ACT inhaler TAKE 2 PUFFS BY MOUTH EVERY 6 HOURS AS NEEDED FOR WHEEZE OR SHORTNESS OF BREATH Patient taking differently: 2 puffs every 6 (six) hours as needed for wheezing or shortness of breath. 02/10/21   Byrum, Rose Fillers, MD  apixaban (ELIQUIS) 5 MG TABS tablet Take 1 tablet (5 mg total) by mouth 2 (two) times daily. APPOINTMENT NEEDED WITH CARDIOLOGIST FOR FURTHER REFILLS Patient taking differently: Take 5 mg by mouth 2 (two) times daily. 04/17/21   Sherran Needs, NP  arformoterol (BROVANA) 15 MCG/2ML NEBU Take 2 mLs (15 mcg total) by nebulization 2 (two) times daily. 08/30/19   Collene Gobble, MD  ascorbic acid (VITAMIN C) 500 MG tablet Take 1 tablet (500 mg total) by mouth daily. Patient not taking: Reported on 09/15/2021 09/12/21   Hosie Poisson, MD  azaTHIOprine (IMURAN) 50 MG tablet Take 125 mg by mouth See admin instructions. Take 2 1/2 tablets (125 mg) by mouth daily at 3pm    [provider]  budesonide (PULMICORT) 0.5 MG/2ML nebulizer solution Take 2 mLs (0.5 mg total) by nebulization 2 (two) times daily. 08/24/19   [name], MD  calcitRIOL (ROCALTROL) 0.25 MCG capsule Take 0.25 mcg by mouth every 3 (three) days.     [provider]  cetirizine (ZYRTEC) 5 MG tablet Take 5 mg by mouth daily.    [provider]  clopidogrel (PLAVIX) 75 MG tablet Take 1 tablet (75 mg total) by mouth daily. 02/22/21   Cheryln Manly, NP  cyclobenzaprine (FLEXERIL) 10 MG tablet Take 1 tablet (10 mg total) by mouth 3 (three) times daily as needed for muscle spasms. 08/25/21   Kathie Dike, MD  Ensure Max Protein (ENSURE MAX PROTEIN) LIQD Take 330 mLs (11 oz total) by mouth 3 (three) times daily between meals. 09/11/21 12/10/21  Hosie Poisson, MD  fluticasone  (FLONASE) 50 MCG/ACT nasal spray PLACE 2 SPRAYS INTO BOTH NOSTRILS 2 TIMES DAILY. Patient taking differently: 2 sprays 2 (two) times daily as needed for allergies. 06/27/21   Collene Gobble, MD  gabapentin (NEURONTIN) 100 MG capsule Take 1 capsule (100 mg total) by mouth at bedtime. 09/11/21   Hosie Poisson, MD  glimepiride (AMARYL) 4 MG tablet Take 4 mg by mouth daily. 02/03/20   [provider]  hydrocortisone (ANUSOL-HC) 25 MG suppository Place 1 suppository (25 mg total) rectally 2 (two) times daily. 08/25/21   Kathie Dike, MD  insulin aspart (NOVOLOG FLEXPEN) 100 UNIT/ML FlexPen Inject 4-10 Units into the skin 3 (three) times daily with meals. Per sliding scale :not provided    [provider]  Insulin Glargine (BASAGLAR KWIKPEN) 100 UNIT/ML Inject 20 Units into the skin daily before breakfast. 08/25/21   Kathie Dike, MD  isosorbide mononitrate (IMDUR) 30 MG 24 hr tablet Take 1 tablet (30 mg total) by mouth daily. 09/12/21   Hosie Poisson, MD  lamoTRIgine (LAMICTAL) 25 MG tablet TAKE 1 TABLET BY MOUTH EVERYDAY AT BEDTIME Patient not taking: Reported on 09/15/2021 07/25/21   Meredith Staggers, MD  linaclotide [facility]) 145 MCG CAPS capsule Take 1 capsule (145 mcg total) by mouth daily before breakfast. 08/25/21   Kathie Dike, MD  LORazepam (ATIVAN) 1 MG tablet Take 0.5 tablets (0.5 mg total) by mouth 2 (two) times daily as needed for anxiety. 08/25/21   Kathie Dike, MD  metFORMIN (GLUCOPHAGE) 500 MG tablet Take 1 tablet (500 mg total) by mouth 2 (two) times daily. 02/22/21   Burnell Blanks, MD  metoprolol tartrate (LOPRESSOR) 25 MG tablet Take 1 tablet (25 mg total) by mouth 3 (three) times daily. 09/11/21   Hosie Poisson, MD  montelukast (SINGULAIR) 10 MG tablet TAKE 1 TABLET BY MOUTH EVERYDAY AT BEDTIME Patient taking differently: Take 10 mg by mouth at bedtime. 06/23/21   Collene Gobble, MD  Multiple Vitamin (MULTIVITAMIN WITH MINERALS) TABS tablet Take 1 tablet by  mouth daily. 09/12/21   Hosie Poisson, MD  nitroGLYCERIN (NITROSTAT) 0.4 MG SL tablet Place 1 tablet (0.4 mg total) under the tongue every 5 (five) minutes as needed for chest pain. 02/21/21   Burnell Blanks, MD  nutrition supplement, JUVEN, (JUVEN) PACK Take 1 packet by mouth 2 (two) times daily between meals. 09/11/21 10/11/21  Hosie Poisson, MD  Nutritional Supplements (,FEEDING SUPPLEMENT, PROSOURCE PLUS) liquid Take 30 mLs by mouth 2 (two) times daily between meals. 09/11/21 10/11/21  Hosie Poisson, MD  nystatin (MYCOSTATIN/NYSTOP) powder Apply topically 3 (three) times daily. 08/25/21   Kathie Dike, MD  Oxycodone HCl 10 MG TABS Take 1 tablet (10 mg total) by mouth 4 (four) times daily as needed. Patient taking differently: Take 10 mg by mouth 4 (four) times daily as needed (pain). 07/24/21   Bayard Hugger, NP  OXYGEN Inhale 3 L into the lungs  continuous.    [provider]  pantoprazole (PROTONIX) 40 MG tablet TAKE 1 TABLET BY MOUTH EVERY DAY Patient taking differently: 40 mg daily. 07/31/21   [name], MD  polyethylene glycol (MIRALAX / GLYCOLAX) 17 g packet Take 17 g by mouth 2 (two) times daily. 08/25/21   Kathie Dike, MD  predniSONE (DELTASONE) 10 MG tablet Take 15mg  po daily for 3 days then 10mg  po daily for 3 days then 5mg  daily 08/25/21   Kathie Dike, MD  promethazine (PHENERGAN) 25 MG tablet Take 25 mg by mouth every 6 (six) hours as needed for nausea or vomiting.    [provider]  ranolazine (RANEXA) 500 MG 12 hr tablet Take 1 tablet (500 mg total) by mouth 2 (two) times daily. 09/11/21   Hosie Poisson, MD  Respiratory Therapy Supplies (NEBULIZER) DEVI Pt needs nebulizr machine dx J44.9 07/17/21   Collene Gobble, MD  rosuvastatin (CRESTOR) 20 MG tablet Take 1 tablet (20 mg total) by mouth daily. 09/10/21   [name], MD  senna-docusate (SENOKOT-S) 8.6-50 MG tablet Take 2 tablets by mouth 2 (two) times daily. 08/25/21   Kathie Dike, MD   sertraline (ZOLOFT) 100 MG tablet Take 1 tablet (100 mg total) by mouth daily. 06/06/21   [name], MD  zinc sulfate 220 (50 Zn) MG capsule Take 1 capsule (220 mg total) by mouth daily. 09/12/21   Hosie Poisson, MD      Allergies    Tetracycline, Niacin, Niaspan [niacin er], Sulfa antibiotics, Sulfonamide derivatives, Codeine, Erythromycin, Hydromorphone hcl, Morphine and related, Nalbuphine, Sulfasalazine, and Tape    Review of Systems   Review of Systems  Physical Exam Updated Vital Signs BP 119/74    Pulse (!) 103    Temp 98.7 F (37.1 C) (Oral)    Resp (!) 25    Ht 5\' 1"  (1.549 m)    Wt 86.2 kg    SpO2 (!) 89%    BMI 35.90 kg/m  Physical Exam Constitutional:      General: She is not in acute distress.    Appearance: She is obese.  HENT:     Head: Normocephalic and atraumatic.  Eyes:     Conjunctiva/sclera: Conjunctivae normal.     Pupils: Pupils are equal, round, and reactive to light.  Cardiovascular:     Rate and Rhythm: Normal rate and regular rhythm.  Pulmonary:     Effort: Pulmonary effort is normal. No respiratory distress.     Comments: 6 L nasal cannula, mild rhonchi bilaterally. Abdominal:     General: There is no distension.     Tenderness: There is no abdominal tenderness.  Skin:    General: Skin is warm and dry.  Neurological:     General: No focal deficit present.     Mental Status: She is alert. Mental status is at baseline.  Psychiatric:        Behavior: Behavior normal.    ED Results / Procedures / Treatments   Labs (all labs ordered are listed, but only abnormal results are displayed) Labs Reviewed  BASIC METABOLIC PANEL - Abnormal; Notable for the following components:      Result Value   Potassium 3.2 (*)    Chloride 96 (*)    Glucose, Bld 216 (*)    All other components within normal limits  CBC WITH DIFFERENTIAL/PLATELET - Abnormal; Notable for the following components:   WBC 14.8 (*)    RBC 3.16 (*)  Hemoglobin 9.2 (*)    HCT  29.4 (*)    RDW 17.5 (*)    Platelets 409 (*)    Neutro Abs 12.2 (*)    Monocytes Absolute 1.1 (*)    Abs Immature Granulocytes 0.12 (*)    All other components within normal limits  MAGNESIUM - Abnormal; Notable for the following components:   Magnesium 1.3 (*)    All other components within normal limits  BRAIN NATRIURETIC PEPTIDE - Abnormal; Notable for the following components:   B Natriuretic Peptide 423.8 (*)    All other components within normal limits  TROPONIN I (HIGH SENSITIVITY) - Abnormal; Notable for the following components:   Troponin I (High Sensitivity) 278 (*)    All other components within normal limits  PROCALCITONIN  TROPONIN I (HIGH SENSITIVITY)    EKG EKG Interpretation  Date/Time:  Saturday September 20 2021 10:51:14 EST Ventricular Rate:  110 PR Interval:  118 QRS Duration: 144 QT Interval:  371 QTC Calculation: 502 R Axis:   -28 Text Interpretation: Sinus or ectopic atrial tachycardia Left bundle branch block No sig change from prior tracing Sep 09 2021, does not meet Sgarbossa criteria for STEMI equivalent Confirmed by Octaviano Glow [id number]) on 09/20/2021 10:54:53 AM  Radiology DG Chest 2 View  Result Date: 09/20/2021 CLINICAL DATA:  Difficulty breathing EXAM: CHEST - 2 VIEW COMPARISON:  Previous studies including the examination of 09/07/2021 FINDINGS: Transverse diameter of heart is increased. There is evidence of previous coronary bypass surgery. Central pulmonary vessels are prominent. There is poor inspiration. Increased interstitial markings are seen in both lungs. There is minimal blunting of lateral CP angles. There is no pneumothorax. IMPRESSION: Cardiomegaly. Diffuse increase in interstitial markings in both lungs may suggest pulmonary edema or interstitial pneumonia. Part of this finding may suggest underlying scarring. Possible small bilateral pleural effusions. Electronically Signed   By: Elmer Picker M.D.   On: 09/20/2021 11:48     Procedures .Critical Care Performed by: Wyvonnia Dusky, MD Authorized by: Wyvonnia Dusky, MD   Critical care provider statement:    Critical care time (minutes):  45   Critical care time was exclusive of:  Separately billable procedures and treating other patients   Critical care was necessary to treat or prevent imminent or life-threatening deterioration of the following conditions:  Respiratory failure and metabolic crisis   Critical care was time spent personally by me on the following activities:  Ordering and performing treatments and interventions, ordering and review of laboratory studies, ordering and review of radiographic studies, pulse oximetry, review of old charts, examination of patient and evaluation of patient's response to treatment   Care discussed with: admitting provider      Medications Ordered in ED Medications  furosemide (LASIX) injection 40 mg (has no administration in time range)  potassium chloride 10 mEq in 100 mL IVPB (has no administration in time range)  magnesium sulfate IVPB 2 g 50 mL (has no administration in time range)    ED Course/ Medical Decision Making/ A&P Clinical Course as of 09/20/21 1347  Sat Sep 20, 2021  1310 Initial troponin 278 shows some minor elevations, consistent with previous history of angina and CAD.  This is not a significant elevation to suggest complete coronary occlusion; and per last hospitalization cardiology evaluation, they will not repeat coronary cath attempt unless clear STEMI/ACS.  She has no active chest pain at this time. [MT]  3419 Patient is a minor leukocytosis but was discharged on a  prednisone taper a week ago and this may be residual from that.  Her x-ray shows interstitial edema but she has known significant multifocal disease from her COVID illness in December.  At this point I suspect this presentation is more consistent with congestive heart failure exacerbation, I believe she would benefit from IV  diuresis, but I do not see a clear indication for recurring antibiotics at this time.  She remains afebrile. [MT]  1346 Admitted to hospitalist Dr Rogers Blocker [MT]    Clinical Course User Index [MT] Langston Masker Carola Rhine, MD                           Medical Decision Making Amount and/or Complexity of Data Reviewed Labs: ordered. Radiology: ordered.  Risk Prescription drug management. Decision regarding hospitalization.   This patient presents to the ED with concern for shortness of breath.  This involves an extensive number of treatment options, and is a complaint that carries with it a high risk of complications and morbidity.  The differential diagnosis includes pleural effusion versus new pneumonia versus anemia versus atypical ACS versus congestive heart failure versus other  Co-morbidities that complicate the patient evaluation: Multiple significant medical comorbidities, including history of clotting, on Eliquis and Plavix, congestive heart failure, difficult IV access stick  External records from outside source obtained and reviewed including left heart catheterization report from a month ago, prior to hospital discharge from January 26 as noted above  I ordered and personally interpreted labs.  The pertinent results include: BNP 400s, initial troponin in the 200s, potassium 3.2.  Museum 1.3.  White blood cell count 14.8 (the patient is on chronic prednisone).  Hemoglobin is 9.2 which is her baseline recently  I ordered imaging studies including x-ray of the chest I independently visualized and interpreted imaging which showed continued cardiomegaly with interstitial edema and multifocal opacities, as noted on prior imaging as well in December. I agree with the radiologist interpretation  The patient was maintained on a cardiac monitor.  I personally viewed and interpreted the cardiac monitored which showed an underlying rhythm of: Sinus rhythm  Per my interpretation the patient's ECG  shows chronic left bundle branch block pattern, some ST elevations but does not meet Sgarbossa criteria for STEMI equivalent  I ordered medication including IV Lasix, IV potassium, IV magnesium for electrolyte derangements and for congestive heart failure pulm edema  I have reviewed the patients home medicines and have made adjustments as needed  Test Considered:  -I have a lower clinical suspicion for pulmonary embolism.  This appears to be all related to her chronic medical conditions, she is compliant with all of her medications at home per her report.  I do not believe she needs an emergent CT PE study at this time.   After the interventions noted above, I reevaluated the patient and found that they have: stayed the same  She remains on 6 L nasal cannula which is an increased requirement from her discharge 2 L nasal cannula.  She remains mildly tachypneic  Dispostion:  After consideration of the diagnostic results and the patients response to treatment, I feel that the patent would benefit from inpatient hospitalization for diuresis, electrolyte restoration.  Last echocardiogram was approxi-1 month ago in December showing an EF of 45 to 50% per my review of the external records.  I do not know if she would benefit from a repeat echocardiogram at this point, given that she does have suspected  ischemic heart disease that was not amenable to intervention last time.  I do not see an emergent indication for cardiology evaluation in the ER, as she is pain free, not currently having anginal symptoms.         Final Clinical Impression(s) / ED Diagnoses Final diagnoses:  Acute on chronic congestive heart failure, unspecified heart failure type ([place])  Hypoxia    Rx / DC Orders ED Discharge Orders     None         Wyvonnia Dusky, MD 09/20/21 1332

## 2021-09-20 NOTE — Assessment & Plan Note (Deleted)
-   UA is abnormal, resume ceftriaxone, follow-up cultures

## 2021-09-20 NOTE — Assessment & Plan Note (Addendum)
-   In sinus rhythm, continue metoprolol -Restarted Eliquis, heparin discontinued

## 2021-09-20 NOTE — ED Triage Notes (Signed)
Pt BIB [facility] from home. CP started 0800 this AM with increased SOB. Pt has been dealing with double pna from [place] for a few weeks. Pt has been home from being hospitalized for about a week. 84% on 2L (baseline), 96% on 10L via NRB. Pt took 2 NTG prior to EMS arrival with some relief of CP, but reported increased improvement with O2. Afib on the monitor, hx of same.   EMS VS- 155/80, HR 105, 96% on 10L via NRB, CBG 270

## 2021-09-20 NOTE — Assessment & Plan Note (Addendum)
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   She had 2 recent unsuccessful left heart cath attempts on 1/5 and 1/20.  Recommendations to continue with medical therapy with clopidogrel, isosorbide (increased dose to 60 mg) and ranolazine.  Add low dose statin therapy.

## 2021-09-20 NOTE — Assessment & Plan Note (Addendum)
Continue with statin therapy.  ?

## 2021-09-20 NOTE — Consult Note (Signed)
CONSULTATION NOTE   Patient Name: Katherine [name] Date of Encounter: 09/20/2021 Cardiologist: [name], MD Electrophysiologist: None Advanced Heart Failure: None   Chief Complaint   Chest pain  Patient Profile   66 yo female with recent COVID 52 in December 2022, history of CABG and PCI, ESRD s/p renal transplant, recently admitted with unstable angina - however, 2 attempts at recent cardiac catheterization were unsuccessful d/t inability to get vascular access. She now presents with NSTEMI and symptoms of acute heart failure.  HPI   Katherine [name] is a 66 y.o. female who is being seen today for the evaluation of NSTEMI/CHF at the request of Dr. Rogers Blocker. This is a 66 yo female patient of Dr. Ellyn Hack with an extensive and complex medical history including recent admission in December 2022 with COVID-pneumonia and NSTEMI, seen last in cardiac rehab in January 2023.  She has a history of CABG in 95 with LIMA to LAD, SVG to diagonal and SVG to RCA.  She had chronic total occlusion of the RCA and LAD in June 2022 and was noted to have 2 layers of stents in the vein graft to a diagonal.  The RCA graft was patent with 40% distal stenosis.  Troponin peaked during that hospitalization at 4000.  Cardiac catheterization was attempted in January but access was unobtainable.  LVEF was 45 to 50% by echo.  She presented in January again with worsening angina and was found to have occluded iliacs bilaterally by Dr. Donzetta Matters.  Medical therapy was adjusted and she was sent for repeat radial catheterization by Dr. Ellyn Hack which unfortunately was unsuccessful due to inability to obtain access with radial spasm.  She was seen by palliative care during that admission and noted to be DNR with discussion about palliation.  She was ultimately discharged on September 11, 2021 with plans for outpatient palliative care services.  Now presents again with shortness of breath and chest pain.  She was noted to be hypoxic  at home with oxygen saturation in the 70s.  She endorsed pain that seemed anginal in nature including radiation to her jaw and associated nausea with some improvement on nitroglycerin.  Cardiology is asked to evaluate.  PMHx   Past Medical History:  Diagnosis Date   Anemia    Anxiety    Bilateral carotid artery stenosis    Carotid duplex 08/5398: 8-67% LICA, 61-95% RICA, >09% RECA, f/u 1 yr suggested   CAD (coronary artery disease) of bypass graft 5/01; 3/'02, 8/'03, 10/'04; 1/15   PCI x 5 to SVG-D1    CAD in native artery 07/1993   3 Vessel Disease (LAD-D1 & RCA) -- CABG (Dx in setting of inferior STEMI-PTCA of RCA)   CAD S/P percutaneous coronary angioplasty    PCI to SVG-D1 insertion/native D1 x 4 = '01 -(S660 BMS 2.5 x 9 anastomosis- D1); '02 - distal overlap ACS Pixel 2.5 x 8  BMS; '03 distal/native ISR/Thrombosis - Pixel 2.5 x 13; '04 - ISR-  Taxus 2.5 x 20 (covered all);; 1/15 - mid SVG-D1 (50% distal ISR) - Promus P 2.75 x 20 -- 2.8 mm; 6/22: Extensive ISR PTCA & Anastomotic-Native Diag DES PCI (Frontier Onyx 2.25x12 (2.75-2.5 mm post-dilation   COPD mixed type ([place])    Followed by Dr. Lamonte Sakai "pulmonologist said no COPD"   Depression with anxiety    Diabetes mellitus type 2 in obese ([place])    Diarrhea    started after cholecystectomy and mass removed from intestine   Dyslipidemia,  goal LDL below 70    08/2012: TC 137, TG 200, HDL 32!, LDL 45; on statin (followed by Dr.Deterding)   ESRD (end stage renal disease) ([place]) 1991   s/p Cadaveric Renal Transplant [facility] - Dr. Jimmy Footman)    Family history of adverse reaction to anesthesia    mom's bp dropped during/after anesthesia   Fibromyalgia    GERD (gastroesophageal reflux disease)    Glomerulonephritis, chronic, rapidly progressive 1989   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 07/1993   Rescue PTCA of RCA -- referred for CABG.   H/O: GI bleed    Headache    migraines in the past   History of CABG x  3 08/1993   Dr. 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  Had having having lots of nausea and vomiting with poor p.o. intake.  Thought to have QT prolongation with multiple medications involved and hypomagnesemia, hypokalemia.  Tikosyn discontinued along with Zoloft and Phenergan.   Hypertension associated with diabetes ([place])    Mild aortic stenosis by prior echocardiogram 07/2019   Echo:  Mild aortic stenosis (gradients: Mean 14.3 mmHg -peak 24.9 mmHg).   Morbid obesity ([facility])    MRSA (methicillin resistant staph aureus) culture positive    OSA (obstructive sleep apnea)    no longer on CPAP or home O2, states she doesn't need now after lap band   PAD (peripheral artery disease) ([place]) 08/2013   LEA Dopplers to be read by Dr. Fletcher Anon   PAF (paroxysmal atrial fibrillation) ([place]) 06/2014   Noted on CardioNet Monitor  - --> rhythm control with Tikosyn (Dr. Rayann Heman); converted from warfarin to apixaban for anticoagulation.   Pneumonia    Recurrent boils    Bilateral Groin   Rheumatoid arthritis ([place])    Per Patient Report; associated with OA   S/p cadaver renal transplant 1991   [facility]   Torsades de pointes 05/12/2021   Unstable angina ([place]) 5/01; 3/'02, 8/'03, 10/'04; 1/15   x 5 occurences since Inf-Post STEMI in 1994    Past Surgical History:  Procedure Laterality Date   ABDOMINAL AORTOGRAM N/A 04/21/2018   Procedure: ABDOMINAL AORTOGRAM;  Surgeon: [name], MD;  Location: [facility];  Service: Cardiovascular;  Laterality: N/A;   CATHETER REMOVAL     CHOLECYSTECTOMY N/A 10/29/2014   Procedure: LAPAROSCOPIC CHOLECYSTECTOMY WITH INTRAOPERATIVE CHOLANGIOGRAM;  Surgeon: Excell Seltzer, MD;  Location: [facility];  Service: General;  Laterality: N/A;   CORONARY ANGIOPLASTY  1994   x5   CORONARY ARTERY  BYPASS GRAFT  1995   LIMA-LAD, SVG-RPDA, SVG-D1   CORONARY STENT INTERVENTION N/A 02/20/2021   Procedure: PERCUTANEOUS CORONARY STENT INTERVENTION;  Surgeon: [name], MD;  Location: [facility]; ostLCx 60% (Neg RFR 0.96);; SVG- D2 recurrent 90% ISR & 95% native D2 after graft-> DES PCI of 95% anastomotic D2 lesion (Onyx Frontier 2.25 mm x 12 mm => 2.75 mm @ overlap, 2.5 distal.);PTCA of ISR in body of graft. ->  2.5 mm scoring balloon & post-dil w/ 2.75 mm Amberley balloon   ESOPHAGOGASTRODUODENOSCOPY N/A 10/15/2016   Procedure: ESOPHAGOGASTRODUODENOSCOPY (EGD);  Surgeon: Wilford Corner, MD;  Location: [facility];  Service: Endoscopy;  Laterality: N/A;   I & D EXTREMITY Right 01/29/2018   Procedure: IRRIGATION AND DEBRIDEMENT  THUMB;  Surgeon: Dayna Barker, MD;  Location: [facility];  Service: Plastics;  Laterality: Right;   INCISE AND DRAIN ABCESS     INTRAVASCULAR PRESSURE WIRE/FFR STUDY N/A 02/20/2021   Procedure: INTRAVASCULAR PRESSURE WIRE/FFR STUDY;  Surgeon: [name], MD;  Location: [facility];  Service: Cardiovascular;  Laterality: N/A;   KIDNEY TRANSPLANT  1991   KNEE ARTHROSCOPY WITH LATERAL MENISECTOMY Left 12/03/2017   Procedure: LEFT KNEE ARTHROSCOPY WITH LATERAL MENISECTOMY;  Surgeon: Earlie Server, MD;  Location: [facility];  Service: Orthopedics;  Laterality: Left;   LAPAROSCOPIC GASTRIC BANDING  04/2004; 10/'09, 2/'10   Port Replacement x 2   LEFT HEART CATH AND CORONARY ANGIOGRAPHY N/A 02/20/2021   Procedure: LEFT HEART CATH AND CORONARY ANGIOGRAPHY;  Surgeon: [name], MD;  Location: [facility];  Service: Cardiovascular;  Laterality: N/A;   LEFT HEART CATH AND CORS/GRAFTS ANGIOGRAPHY N/A 04/21/2018   Procedure: LEFT HEART CATH AND CORS/GRAFTS ANGIOGRAPHY;  Surgeon: [name], MD;  Location: [facility];  Ost-Prox LAD 50% - proxLAD (pre & post D1) 100% CTO. Cx - patent, small OM1 (stable ~ ostial OM1 90%, too small for PCI) & 2  small LPL; Ost-distal RCA 100% CTO.  LIMA-LAD (not injected); SVG-dRCA patent, SVG-D1 - insertion stent ~20% ISR - Severe R CFA disease w/ focal Sub TO   LEFT HEART CATH AND CORS/GRAFTS ANGIOGRAPHY  5/'01, 3/'02, 8/'03, 10/'04; 1/'15   08/22/2013: LAD & RCA 100%; LIMA-LAD & SVG-rPDA patent; Cx-- OM1 60%, OM2 ostial ~50%; SVG-D1 - 80% mid, 50% distal ISR --PCI   LEFT HEART CATH AND CORS/GRAFTS ANGIOGRAPHY N/A 01/31/2021   Procedure: LEFT HEART CATH AND CORS/GRAFTS ANGIOGRAPHY;  Surgeon: Jolaine Artist, MD;  Location: [facility];;   LEFT HEART CATH AND CORS/GRAFTS ANGIOGRAPHY N/A 09/05/2021   Procedure: LEFT HEART CATH AND CORS/GRAFTS ANGIOGRAPHY;  Surgeon: [name], MD;  Location: [facility];  Service: Cardiovascular;  Laterality: N/A;   LEFT HEART CATH AND CORS/GRAFTS ANGIOGRAPHY N/A 08/21/2021   Procedure: LEFT HEART CATH AND CORS/GRAFTS ANGIOGRAPHY;  Surgeon: Belva Crome, MD;  Location: [facility];  Service: Cardiovascular;  Laterality: N/A;   LEFT HEART CATHETERIZATION WITH CORONARY/GRAFT ANGIOGRAM N/A 08/23/2013   Procedure: LEFT HEART CATHETERIZATION WITH Beatrix Fetters;  Surgeon: Wellington Hampshire, MD;  Location: [facility];  Service: Cardiovascular;  Laterality: N/A;   Lower Extremity Arterial Dopplers  08/2013   ABI: R 0.96, L 1.04   MULTIPLE TOOTH EXTRACTIONS  age 54   NM MYOVIEW LTD  03/2016   EF 62%. LOW RISK. C/W prior MI - no Ischemia. Apical hypokinesis.   PERCUTANEOUS CORONARY STENT INTERVENTION (PCI-S)  5/'01, 3/'02, 8/'03, 10/'04;   '01 - S660 BMS 2.5 x 9 - dSVG-D1 into D1; '02- post-stent stenosis - 2.5 x 8 Pixel BMS; '8\03: ISR/Thrombosis into native D1 - AngioJet, 2.5 x 13 Pixel; '04 - ISR 95% - covered stented area with Taxus DES 2.5 mm x 20 (2.88)   PERCUTANEOUS CORONARY STENT INTERVENTION (PCI-S)  08/23/2013   Procedure: PERCUTANEOUS CORONARY STENT INTERVENTION (PCI-S);  Surgeon: Wellington Hampshire, MD;  Location: [facility] CATH LAB;   Service: Cardiovascular;;mid SVG-D1 80%; distal stent ~50% ISR; Promus Prermier DES 2.75 mm xc 20 mm (2.8 mm)   PORT-A-CATH REMOVAL     kidney   TRANSTHORACIC ECHOCARDIOGRAM  07/2019   a) 07/2019: EF 55 to 60%.  No LVH.  Paradoxical septal WM-s/p CABG.  GRII DD.  Nl RV size and fxn.  Mild bilateral atrial dilation.  Mod MAC.  Trace MR.  Mild AS (gradients: Mean 14.3 mmHg -peak 24.9 mmHg).; B) 06/2020: EF 40 to 45%.  Moderate concentric LVH.  GRII DD.  Elevated LAP.  Mod HK mid Apical Ant-AntSept wall & mild Apical Dyskinesis.  Mod LA dilation.  Mild MR.  AoV sclerosis w/o AS.   TRANSTHORACIC ECHOCARDIOGRAM  01/30/2021   EF 55 to 60%.  Mild LVH.  GR 1 DD.  Elevated LAP.  Moderate LA dilation.  Mild MR with mild MS.  Mild aortic valve stenosis.:   TUBAL LIGATION     wrist fistula repair Left    dialysis for one year    FAMHx   Family History  Problem Relation Age of Onset   Cancer Mother        liver   Heart disease Father    Cancer Father        colon   Arrhythmia Brother        Atrial Fibrillation   Arrhythmia Paternal Aunt        Atrial Fibrillation    SOCHx    reports that she has been smoking cigarettes. She has a 30.00 pack-year smoking history. She has never used smokeless tobacco. She reports that she does not drink alcohol and does not use drugs.  Outpatient Medications   No current facility-administered medications on file prior to encounter.   Current Outpatient Medications on File Prior to Encounter  Medication Sig Dispense Refill   albuterol (VENTOLIN HFA) 108 (90 Base) MCG/ACT inhaler TAKE 2 PUFFS BY MOUTH EVERY 6 HOURS AS NEEDED FOR WHEEZE OR SHORTNESS OF BREATH (Patient taking differently: 2 puffs every 6 (six) hours as needed for wheezing or shortness of breath.) 18 each 3   apixaban (ELIQUIS) 5 MG TABS tablet Take 1 tablet (5 mg total) by mouth 2 (two) times daily. APPOINTMENT NEEDED WITH CARDIOLOGIST FOR FURTHER REFILLS (Patient taking differently: Take 5 mg by  mouth 2 (two) times daily.) 60 tablet 0   arformoterol (BROVANA) 15 MCG/2ML NEBU Take 2 mLs (15 mcg total) by nebulization 2 (two) times daily. 120 mL 12   ascorbic acid (VITAMIN C) 500 MG tablet Take 1 tablet (500 mg total) by mouth daily. 30 tablet 1   azaTHIOprine (IMURAN) 50 MG tablet Take 125 mg by mouth See admin instructions. Take 2 1/2 tablets (125 mg) by mouth daily at 3pm     budesonide (PULMICORT) 0.5 MG/2ML nebulizer solution Take 2 mLs (0.5 mg total) by nebulization 2 (two) times daily. 360 mL 1   calcitRIOL (ROCALTROL) 0.25 MCG capsule Take 0.25 mcg by mouth every 3 (three) days.      cetirizine (ZYRTEC) 5 MG tablet Take 5 mg by mouth daily.     clopidogrel (PLAVIX) 75 MG tablet Take 1 tablet (75 mg total) by mouth daily. 90 tablet 2   cyclobenzaprine (FLEXERIL) 10 MG tablet Take 1 tablet (10 mg total) by mouth 3 (three) times daily as needed for muscle spasms. 90 tablet 2   fluticasone (FLONASE) 50 MCG/ACT nasal spray PLACE 2 SPRAYS INTO BOTH NOSTRILS 2 TIMES DAILY. (Patient taking differently: 2 sprays 2 (two) times daily as needed for allergies.) 96 mL 0   gabapentin (NEURONTIN) 100 MG capsule Take 1 capsule (100 mg total) by mouth at bedtime. 30 capsule 1   glimepiride (AMARYL) 4 MG tablet Take 4 mg by mouth daily.     insulin aspart (NOVOLOG  FLEXPEN) 100 UNIT/ML FlexPen Inject 4-10 Units into the skin 3 (three) times daily with meals. Per sliding scale :not provided     Insulin Glargine (BASAGLAR KWIKPEN) 100 UNIT/ML Inject 20 Units into the skin daily before breakfast. (Patient taking differently: Inject 12 Units into the skin daily before breakfast.)     isosorbide mononitrate (IMDUR) 30 MG 24 hr tablet Take 1 tablet (30 mg total) by mouth daily. 30 tablet 2   lamoTRIgine (LAMICTAL) 25 MG tablet TAKE 1 TABLET BY MOUTH EVERYDAY AT BEDTIME 90 tablet 1   linaclotide (LINZESS) 145 MCG CAPS capsule Take 1 capsule (145 mcg total) by mouth daily before breakfast. 30 capsule     LORazepam (ATIVAN) 1 MG tablet Take 0.5 tablets (0.5 mg total) by mouth 2 (two) times daily as needed for anxiety. 60 tablet 5   metFORMIN (GLUCOPHAGE) 500 MG tablet Take 1 tablet (500 mg total) by mouth 2 (two) times daily. 30 tablet 6   metoprolol tartrate (LOPRESSOR) 25 MG tablet Take 1 tablet (25 mg total) by mouth 3 (three) times daily. 90 tablet 1   montelukast (SINGULAIR) 10 MG tablet TAKE 1 TABLET BY MOUTH EVERYDAY AT BEDTIME (Patient taking differently: Take 10 mg by mouth at bedtime.) 90 tablet 0   Multiple Vitamin (MULTIVITAMIN WITH MINERALS) TABS tablet Take 1 tablet by mouth daily.     nitroGLYCERIN (NITROSTAT) 0.4 MG SL tablet Place 1 tablet (0.4 mg total) under the tongue every 5 (five) minutes as needed for chest pain. 25 tablet 6   Oxycodone HCl 10 MG TABS Take 1 tablet (10 mg total) by mouth 4 (four) times daily as needed. (Patient taking differently: Take 10 mg by mouth 4 (four) times daily as needed (pain).) 100 tablet 0   OXYGEN Inhale 4 L into the lungs continuous.     pantoprazole (PROTONIX) 40 MG tablet TAKE 1 TABLET BY MOUTH EVERY DAY (Patient taking differently: 40 mg daily.) 15 tablet 0   predniSONE (DELTASONE) 10 MG tablet Take 2m po daily for 3 days then 1337mpo daily for 3 days then 37m76maily     promethazine (PHENERGAN) 25 MG tablet Take 25 mg by mouth every 6 (six) hours as needed for nausea or vomiting.     rosuvastatin (CRESTOR) 20 MG tablet Take 1 tablet (20 mg total) by mouth daily. 90 tablet 1   sertraline (ZOLOFT) 100 MG tablet Take 1 tablet (100 mg total) by mouth daily. 90 tablet 2   zinc sulfate 220 (50 Zn) MG capsule Take 1 capsule (220 mg total) by mouth daily.     acetaminophen (TYLENOL) 500 MG tablet Take 1,000 mg by mouth daily. (Patient not taking: Reported on 09/20/2021)     Ensure Max Protein (ENSURE MAX PROTEIN) LIQD Take 330 mLs (11 oz total) by mouth 3 (three) times daily between meals. (Patient not taking: Reported on 09/20/2021) 29700 mL 2    hydrocortisone (ANUSOL-HC) 25 MG suppository Place 1 suppository (25 mg total) rectally 2 (two) times daily. (Patient not taking: Reported on 09/20/2021) 12 suppository 0   nutrition supplement, JUVEN, (JUVEN) PACK Take 1 packet by mouth 2 (two) times daily between meals. (Patient not taking: Reported on 09/20/2021) 60 each 0   Nutritional Supplements (,FEEDING SUPPLEMENT, PROSOURCE PLUS) liquid Take 30 mLs by mouth 2 (two) times daily between meals. (Patient not taking: Reported on 09/20/2021) 1800 mL 0   nystatin (MYCOSTATIN/NYSTOP) powder Apply topically 3 (three) times daily. (Patient not taking: Reported on 09/20/2021) 15 g  0   polyethylene glycol (MIRALAX / GLYCOLAX) 17 g packet Take 17 g by mouth 2 (two) times daily. (Patient not taking: Reported on 09/20/2021) 14 each 0   ranolazine (RANEXA) 500 MG 12 hr tablet Take 1 tablet (500 mg total) by mouth 2 (two) times daily. 60 tablet 0   Respiratory Therapy Supplies (NEBULIZER) DEVI Pt needs nebulizr machine dx J44.9 1 each 0   senna-docusate (SENOKOT-S) 8.6-50 MG tablet Take 2 tablets by mouth 2 (two) times daily. (Patient not taking: Reported on 09/20/2021)      Inpatient Medications    Scheduled Meds:  insulin aspart  0-15 Units Subcutaneous TID WC    Continuous Infusions:  cefTRIAXone (ROCEPHIN)  IV     heparin     potassium chloride 10 mEq (09/20/21 1442)    PRN Meds:    ALLERGIES   Allergies  Allergen Reactions   Tetracycline Hives    Patient tolerated Doxycycline Dec 2020   Niacin Other (See Comments)    Mouth blisters   Niaspan [Niacin Er] Other (See Comments)    Mouth blisters   Sulfa Antibiotics Nausea Only and Other (See Comments)    "Tears up stomach"   Sulfonamide Derivatives Other (See Comments)    Reaction: per patient "tears her stomach up"   Codeine Nausea And Vomiting   Erythromycin Nausea And Vomiting   Hydromorphone Hcl Nausea And Vomiting   Morphine And Related Nausea And Vomiting   Nalbuphine Nausea And  Vomiting    Nubain   Sulfasalazine Nausea Only and Other (See Comments)    per patient "tears her stomach up", "Tears up stomach"   Tape Rash and Other (See Comments)    No "plastic" tape," please----cloth tape only    ROS   Pertinent items noted in HPI and remainder of comprehensive ROS otherwise negative.  Vitals   Vitals:   09/20/21 1300 09/20/21 1330 09/20/21 1430 09/20/21 1434  BP: 119/74 97/81 118/66   Pulse: (!) 103 (!) 102 98   Resp: (!) _0 Temp:      TempSrc:      SpO2: (!) 89% 90% 97% 94%  Weight:      Height:       No intake or output data in the 24 hours ending 09/20/21 1514 Filed Weights   09/20/21 1052  Weight: 86.2 kg    Physical Exam   General appearance: alert, no distress, and moderately obese Neck: JVD - 4 cm above sternal notch, no carotid bruit, and thyroid not enlarged, symmetric, no tenderness/mass/nodules Lungs: diminished breath sounds bibasilar and dullness to percussion bibasilar Heart: regular rate and rhythm Abdomen: soft, non-tender; bowel sounds normal; no masses,  no organomegaly Extremities: edema trace bilateral Pulses: 2+ and symmetric Skin: Skin color, texture, turgor normal. No rashes or lesions Neurologic: Grossly normal Psych: Tearful, in pain  Labs   Results for orders placed or performed during the hospital encounter of 09/20/21 (from the past 48 hour(s))  Basic metabolic panel     Status: Abnormal   Collection Time: 09/20/21 11:02 AM  Result Value Ref Range   Sodium 135 135 - 145 mmol/L   Potassium 3.2 (L) 3.5 - 5.1 mmol/L   Chloride 96 (L) 98 - 111 mmol/L   CO2 25 22 - 32 mmol/L   Glucose, Bld 216 (H) 70 - 99 mg/dL    Comment: Glucose reference range applies only to samples taken after fasting for at least 8 hours.   BUN 16  8 - 23 mg/dL   Creatinine, Ser 0.89 0.44 - 1.00 mg/dL   Calcium 9.1 8.9 - 10.3 mg/dL   GFR, Estimated >60 >60 mL/min    Comment: (NOTE) Calculated using the CKD-EPI Creatinine Equation  (2021)    Anion gap 14 5 - 15    Comment: Performed at [facility] [address], [place], [place] [address]  CBC with Differential     Status: Abnormal   Collection Time: 09/20/21 11:02 AM  Result Value Ref Range   WBC 14.8 (H) 4.0 - 10.5 K/uL   RBC 3.16 (L) 3.87 - 5.11 MIL/uL   Hemoglobin 9.2 (L) 12.0 - 15.0 g/dL   HCT 29.4 (L) 36.0 - 46.0 %   MCV 93.0 80.0 - 100.0 fL   MCH 29.1 26.0 - 34.0 pg   MCHC 31.3 30.0 - 36.0 g/dL   RDW 17.5 (H) 11.5 - 15.5 %   Platelets 409 (H) 150 - 400 K/uL   nRBC 0.0 0.0 - 0.2 %   Neutrophils Relative % 82 %   Neutro Abs 12.2 (H) 1.7 - 7.7 K/uL   Lymphocytes Relative 7 %   Lymphs Abs 1.1 0.7 - 4.0 K/uL   Monocytes Relative 8 %   Monocytes Absolute 1.1 (H) 0.1 - 1.0 K/uL   Eosinophils Relative 2 %   Eosinophils Absolute 0.3 0.0 - 0.5 K/uL   Basophils Relative 0 %   Basophils Absolute 0.1 0.0 - 0.1 K/uL   Immature Granulocytes 1 %   Abs Immature Granulocytes 0.12 (H) 0.00 - 0.07 K/uL    Comment: Performed at [facility], [place] [address] [place], [place] [address]  Magnesium     Status: Abnormal   Collection Time: 09/20/21 11:02 AM  Result Value Ref Range   Magnesium 1.3 (L) 1.7 - 2.4 mg/dL    Comment: Performed at [facility] [address], [place], [place] [address]  Troponin I (High Sensitivity)     Status: Abnormal   Collection Time: 09/20/21 11:02 AM  Result Value Ref Range   Troponin I (High Sensitivity) 278 (HH) <18 ng/L    Comment: CRITICAL RESULT CALLED TO, READ BACK BY AND VERIFIED WITH: C COGAN,RN 09/20/2021 1304 WILDERK (NOTE) Elevated high sensitivity troponin I (hsTnI) values and significant  changes across serial measurements may suggest ACS but many other  chronic and acute conditions are known to elevate hsTnI results.  Refer to the Links section for chest pain algorithms and additional  guidance. Performed at [facility], [place] [address], [place], [place] [address]   Brain natriuretic  peptide     Status: Abnormal   Collection Time: 09/20/21 11:26 AM  Result Value Ref Range   B Natriuretic Peptide 423.8 (H) 0.0 - 100.0 pg/mL    Comment: Performed at [facility] [address], [place], [place] [address]  Troponin I (High Sensitivity)     Status: Abnormal   Collection Time: 09/20/21  1:02 PM  Result Value Ref Range   Troponin I (High Sensitivity) 366 (HH) <18 ng/L    Comment: CRITICAL VALUE NOTED.  VALUE IS CONSISTENT WITH PREVIOUSLY REPORTED AND CALLED VALUE. (NOTE) Elevated high sensitivity troponin I (hsTnI) values and significant  changes across serial measurements may suggest ACS but many other  chronic and acute conditions are known to elevate hsTnI results.  Refer to the Links section for chest pain algorithms and additional  guidance. Performed at [facility], [address] [place], [place] [address]   Procalcitonin - Baseline     Status: None   Collection Time: 09/20/21  1:15 PM  Result Value Ref Range   Procalcitonin <0.10 ng/mL    Comment:        Interpretation: PCT (Procalcitonin) <= 0.5 ng/mL: Systemic infection (sepsis) is not likely. Local bacterial infection is possible. (NOTE)       Sepsis PCT Algorithm           Lower Respiratory Tract                                      Infection PCT Algorithm    ----------------------------     ----------------------------         PCT < 0.25 ng/mL                PCT < 0.10 ng/mL          Strongly encourage             Strongly discourage   discontinuation of antibiotics    initiation of antibiotics    ----------------------------     -----------------------------       PCT 0.25 - 0.50 ng/mL            PCT 0.10 - 0.25 ng/mL               OR       >80% decrease in PCT            Discourage initiation of                                            antibiotics      Encourage discontinuation           of antibiotics    ----------------------------     -----------------------------          PCT >= 0.50 ng/mL              PCT 0.26 - 0.50 ng/mL               AND        <80% decrease in PCT             Encourage initiation of                                             antibiotics       Encourage continuation           of antibiotics    ----------------------------     -----------------------------        PCT >= 0.50 ng/mL                  PCT > 0.50 ng/mL               AND         increase in PCT                  Strongly encourage  initiation of antibiotics    Strongly encourage escalation           of antibiotics                                     -----------------------------                                           PCT <= 0.25 ng/mL                                                 OR                                        > 80% decrease in PCT                                      Discontinue / Do not initiate                                             antibiotics  Performed at [facility], [address] [place], [place] [address]    *Note: Due to a large number of results and/or encounters for the requested time period, some results have not been displayed. A complete set of results can be found in Results Review.    ECG   Sinus rhythm at 97, LBBB- Personally Reviewed  Telemetry   Sinus rhythm- Personally Reviewed  Radiology   DG Chest 2 View  Result Date: 09/20/2021 CLINICAL DATA:  Difficulty breathing EXAM: CHEST - 2 VIEW COMPARISON:  Previous studies including the examination of 09/07/2021 FINDINGS: Transverse diameter of heart is increased. There is evidence of previous coronary bypass surgery. Central pulmonary vessels are prominent. There is poor inspiration. Increased interstitial markings are seen in both lungs. There is minimal blunting of lateral CP angles. There is no pneumothorax. IMPRESSION: Cardiomegaly. Diffuse increase in interstitial markings in both lungs may suggest pulmonary edema or interstitial  pneumonia. Part of this finding may suggest underlying scarring. Possible small bilateral pleural effusions. Electronically Signed   By: Elmer Picker M.D.   On: 09/20/2021 11:48    Cardiac Studies   Echo pending  Impression   Active Problems:   Hypokalemia and hypomagnesia    Dyslipidemia, goal LDL below 70   NSTEMI (non-ST elevated myocardial infarction) with CAD S/P percutaneous coronary angioplasty - PCI x 5 to SVG-D1   Essential hypertension   PAD (peripheral artery disease) (HCC)   Type 2 diabetes mellitus with peripheral neuropathy (HCC)   Deficiency anemia   Anxiety and depression   Acute on chronic systolic CHF (congestive heart failure) (HCC)   COPD (chronic obstructive pulmonary disease) ([place])   History of renal transplant   Acute on chronic respiratory failure with hypoxia (HCC)   Longstanding persistent atrial fibrillation ([place])   questionable pneumonia on CXR  Dysuria   Recommendation   NSTEMI She is again endorsing symptoms suggestive of unstable angina and has elevated troponins at 278 at 366, probably demand ischemia however cannot rule out NSTEMI.  BNP was elevated at 432 suggestive of heart failure component.  Unfortunately, coronary access has been limited and she has not been able to undergo revascularization during her last 2 cardiac catheterizations, therefore options are quite limited.  Reasonable to start heparin for 48 hours.  We will repeat echo to see if there has been any further decline in LV function. But this is most likely best for prognosis. Titrate antianginal medications as bp allows, including increase in imdur and Ranexa.  Acute on chronic systolic congestive heart failure Elevated BNP and worsening shortness of breath with edema suggestive of acute on chronic systolic heart failure.  Her last echo was notable for LVEF 45 to 50% however this may have declined further.  Repeat echo is reasonable for prognostic purposes although we are not  likely able to intervene on her coronaries based on 2 recent prior unsuccessful attempts at access.  Would escalate diuresis. Monitor output and renal function given transplant.  History of renal transplant Renal function appears normal.  Continue current antirejection medications as per hospital medicine. Monitor urine output with diuresis.  History of palliative care evaluation Previously noted to be DNR with discussions about outpatient palliative medicine.  Unfortunately, given few options I think we should continue this discussion and focused on preventing readmission with symptom control options as an outpatient.  From a cardiac standpoint she would be considered a candidate for hospice due to intractable angina plus systolic congestive heart failure. She would probably benefit from additional pain control with opiates or other options, noting she is also c/o back pain as well.  Thanks for the consultation. Cardiology will follow with you.  Time Spent Directly with Patient:  I have spent a total of 45 minutes with the patient reviewing hospital notes, telemetry, EKGs, labs and examining the patient as well as establishing an assessment and plan that was discussed personally with the patient.  > 50% of time was spent in direct patient care.  Length of Stay:  LOS: 0 days   Pixie Casino, MD, [facility], [place] Director of the Advanced Lipid Disorders &  Cardiovascular Risk Reduction Clinic Diplomate of the American Board of Clinical Lipidology Attending Cardiologist  Direct Dial: [phone]   Fax: [phone]  Website:  [email or web address]   Nadean Corwin Daven Pinckney 09/20/2021, 3:14 PM

## 2021-09-20 NOTE — Assessment & Plan Note (Addendum)
Patient was admitted to the cardiac unit and was placed on supplemental 02 per [place] with good toleration.   Patient had recent COVID 19 viral pneumonia and suspected lung injury.   Responded well to diuresis, at discharge her oxygenation is 98 on 4 l/min per [place].  She has home 02 arranged.

## 2021-09-20 NOTE — Assessment & Plan Note (Addendum)
Clinically do not suspect pneumonia, monitor off antibiotics

## 2021-09-20 NOTE — Assessment & Plan Note (Addendum)
No signs of clinical exacerbation Continue with as needed bronchodilator therapy and supplemental 02 per [place]

## 2021-09-20 NOTE — Assessment & Plan Note (Addendum)
Patient will continue with  immunosuppressants: imuran  On chronic prednisone, 5mg  daily

## 2021-09-20 NOTE — Assessment & Plan Note (Addendum)
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 Patient is high risk for open surgery.  Patient will continue with clopidogrel and statin therapy. [name] Kitchen

## 2021-09-20 NOTE — Assessment & Plan Note (Addendum)
Cell count remained stable, plan to follow up cell count as outpatient.  At her discharge her hgb was 8,2 with hct 26.3

## 2021-09-20 NOTE — Assessment & Plan Note (Addendum)
Continue blood pressure control with metoprolol and diuresis with furosemide. Her blood pressure remained well controlled.

## 2021-09-20 NOTE — Assessment & Plan Note (Deleted)
-  acute on chronic respiratory failure with elevated BNP and CXR with pulmonary edema -has NSTEMI and ? If caused worsening EF vs. Other cause. Hard to determine at this time  -limited echo pending -had echo 12/22 that showed EF of 45-50% with mildly decreased LVF. diastolic undetermined.  -given 40mg  lasix, will continue this and watch output escalate as needed  -strict I/O and daily weight -weight down 3 pounds from 1/24.

## 2021-09-20 NOTE — Assessment & Plan Note (Addendum)
Positive hyperglycemia with uncontrolled T2DM.  Steroid induced hyperglycemia.  Patient was placed on basal insulin, pre- meal short acting insulin and insulin sliding scale for glucose cover and monitoring.   At her discharge patient will continue with glimepride, metformin and basal insulin.   Statin therapy.

## 2021-09-20 NOTE — ED Notes (Signed)
MD at bedside. 

## 2021-09-20 NOTE — Assessment & Plan Note (Addendum)
Patient is on sertraline and lorazepam.

## 2021-09-20 NOTE — Assessment & Plan Note (Addendum)
Hyponatremia.  Patient responded well to diuresis, her electrolytes were corrected.  At her discharge her renal function showed a serum cr of 0,93 with K at 4.0 and serum bicarbonate at 33. Plan to continue with furosemide for diuresis with K supplementation.  Follow renal function and electrolytes as outpatient.

## 2021-09-20 NOTE — Progress Notes (Signed)
ANTICOAGULATION CONSULT NOTE - Initial Consult  Pharmacy Consult for heparin Indication: atrial fibrillation  Allergies  Allergen Reactions   Tetracycline Hives    Patient tolerated Doxycycline Dec 2020   Niacin Other (See Comments)    Mouth blisters   Niaspan [Niacin Er] Other (See Comments)    Mouth blisters   Sulfa Antibiotics Nausea Only and Other (See Comments)    "Tears up stomach"   Sulfonamide Derivatives Other (See Comments)    Reaction: per patient "tears her stomach up"   Codeine Nausea And Vomiting   Erythromycin Nausea And Vomiting   Hydromorphone Hcl Nausea And Vomiting   Morphine And Related Nausea And Vomiting   Nalbuphine Nausea And Vomiting    Nubain   Sulfasalazine Nausea Only and Other (See Comments)    per patient "tears her stomach up", "Tears up stomach"   Tape Rash and Other (See Comments)    No "plastic" tape," please----cloth tape only    Patient Measurements: Height: 5\' 1"  (154.9 cm) Weight: 86.2 kg (190 lb) IBW/kg (Calculated) : 47.8 Heparin Dosing Weight: 67 kg   Vital Signs: Temp: 98.7 F (37.1 C) (02/04 1049) Temp Source: Oral (02/04 1049) BP: 118/66 (02/04 1430) Pulse Rate: 98 (02/04 1430)  Labs: Recent Labs    09/20/21 1102 09/20/21 1302  HGB 9.2*  --   HCT 29.4*  --   PLT 409*  --   CREATININE 0.89  --   TROPONINIHS 278* 366*    Estimated Creatinine Clearance: 63.7 mL/min (by C-G formula based on SCr of 0.89 mg/dL).   Medical History: Past Medical History:  Diagnosis Date   Anemia    Anxiety    Bilateral carotid artery stenosis    Carotid duplex 02/5101: 5-85% LICA, 27-78% RICA, >24% RECA, f/u 1 yr suggested   CAD (coronary artery disease) of bypass graft 5/01; 3/'02, 8/'03, 10/'04; 1/15   PCI x 5 to SVG-D1    CAD in native artery 07/1993   3 Vessel Disease (LAD-D1 & RCA) -- CABG (Dx in setting of inferior STEMI-PTCA of RCA)   CAD S/P percutaneous coronary angioplasty    PCI to SVG-D1 insertion/native D1 x 4 = '01  -(S660 BMS 2.5 x 9 anastomosis- D1); '02 - distal overlap ACS Pixel 2.5 x 8  BMS; '03 distal/native ISR/Thrombosis - Pixel 2.5 x 13; '04 - ISR-  Taxus 2.5 x 20 (covered all);; 1/15 - mid SVG-D1 (50% distal ISR) - Promus P 2.75 x 20 -- 2.8 mm; 6/22: Extensive ISR PTCA & Anastomotic-Native Diag DES PCI ([place] Onyx 2.25x12 (2.75-2.5 mm post-dilation   COPD mixed type ([place])    Followed by Dr. Lamonte Sakai "pulmonologist said no COPD"   Depression with anxiety    Diabetes mellitus type 2 in obese ([place])    Diarrhea    started after cholecystectomy and mass removed from intestine   Dyslipidemia, goal LDL below 70    08/2012: TC 137, TG 200, HDL 32!, LDL 45; on statin (followed by Dr.Deterding)   ESRD (end stage renal disease) ([place]) 1991   s/p Cadaveric Renal Transplant ([facility] - Dr. Jimmy Footman)    Family history of adverse reaction to anesthesia    mom's bp dropped during/after anesthesia   Fibromyalgia    GERD (gastroesophageal reflux disease)    Glomerulonephritis, chronic, rapidly progressive 1989   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 07/1993   Rescue PTCA of RCA -- referred for CABG.   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  Had having having lots of nausea and vomiting with poor p.o. intake.  Thought to have QT prolongation with multiple medications involved and hypomagnesemia, hypokalemia.  Tikosyn discontinued along with Zoloft and Phenergan.   Hypertension associated with diabetes ([place])    Mild aortic stenosis by prior echocardiogram 07/2019   Echo:  Mild aortic stenosis (gradients: Mean 14.3 mmHg -peak 24.9 mmHg).   Morbid obesity (HCC)    MRSA (methicillin resistant staph aureus) culture  positive    OSA (obstructive sleep apnea)    no longer on CPAP or home O2, states she doesn't need now after lap band   PAD (peripheral artery disease) ([place]) 08/2013   LEA Dopplers to be read by Dr. Fletcher Anon   PAF (paroxysmal atrial fibrillation) ([place]) 06/2014   Noted on CardioNet Monitor  - --> rhythm control with Tikosyn (Dr. Rayann Heman); converted from warfarin to apixaban for anticoagulation.   Pneumonia    Recurrent boils    Bilateral Groin   Rheumatoid arthritis ([place])    Per Patient Report; associated with OA   S/p cadaver renal transplant 1991   [facility]   Torsades de pointes 05/12/2021   Unstable angina ([place]) 5/01; 3/'02, 8/'03, 10/'04; 1/15   x 5 occurences since Inf-Post STEMI in 1994    Medications:  (Not in a hospital admission)   Assessment: 16 YOF with SOB on Eliquis at home for h/o Afib. Pharmacy consulted to transition to IV heparin for elevated troponin and concern for ACS.  H/H low, Plt elevated. Of note, last dose of apixaban was this AM around 10 AM.   Goal of Therapy:  Heparin level 0.3-0.7 units/ml aPTT 66-102 seconds Monitor platelets by anticoagulation protocol: Yes   Plan:  -Start heparin at 1000 units/hr to start at 10 PM tonight. No bolus -F/u aPTT and HL 6 hours after heparin start -Monitor daily aPTT, HL, CBC and s/s of bleeding   Katherine [name], PharmD., BCCCP Clinical Pharmacist Please refer to [facility] for unit-specific pharmacist

## 2021-09-20 NOTE — H&P (Signed)
History and Physical    Patient: Katherine [name] UUV:[id number] DOB: 02/16/1956 DOA: 09/20/2021 DOS: the patient was seen and examined on 09/20/2021 PCP: [name], MD  Patient coming from: Home - lives with brother and god daughter. Uses walker for assistance with ambulation.    Chief Complaint: shortness of breath and chest pain   HPI: Katherine [name] is a 66 y.o. female with medical history significant of   CAD s/p CABG and PCI, bilateral carotid artery stenosis, COPD home oxygen dependent on 3 L, DM type II, s/p renal transplant, anxiety, depression, atrial fibrillation on eliquis, anemia, fibromyalgia/chronic pain urolithiasis, history of GI bleed, and GERD.  She had been admitted on 07/25/2021-08/25/2021, for COVID with MRSA pneumonia presenting with abrupt onset shortness of breath and some chest pain around 8am. She was able to get to the bathroom and was starting back to bed and checked her pulse ox and it was the 70s. She thinks she was gasping to breath some. She then called EMS. She states the chest pain felt like it was in her "lungs." It felt similar to previous heart attacks in the past. Pain radiated up into her jaw, not her arm. She also had associated nausea. She took some NG and helped relieve the chest pain. She has not chest pain at this time. She has lost 30 pounds since in the hospital, unsure if leg swelling any worse and admits to worsening orthopnea.   Recently admitted here at [facility] 1/17-1/26/23. NSTEMI with unsuccessful coronary angiography via right radial on 4/74/2595 complicated by hematoma. Cardiology did not recommend repeat cath unless having a STEMI.   She states since she got out of the hospital she has not felt great. She is unsure if she has had fevers, she has had flutters, +cough that is the same, no stomach pain, N/V/D, she does feel like she has a UTI (has dysuria, urgency and feels like she needs to go, but can't). She has leg swelling.   She has not  missed any doses of her eliquis. Weight is down 3 pounds from 09/09/21.    ER Course:  vitals: afebrile, bp: 113/73, HR: 112, RR; 20, oxygen: 99% on 6L >8L HFNC Pertinent labs: wbc: 14.8, hgb: 9.2 (8-9), potassium: 3.2, mag: 1.3, troponin: 278>   BNP: 423 CXR: increase in interstitial markings in both lungs ? Pulmonary edema or interstitial pneumonia. ? Scarring. Possible small bilateral pleural effusions.  Mag and potassium replaced. Given 81m IV lasix. TRH asked to admit.    Review of Systems: As mentioned in the history of present illness. All other systems reviewed and are negative. Past Medical History:  Diagnosis Date   Anemia    Anxiety    Bilateral carotid artery stenosis    Carotid duplex 76/3875 16-43%LICA, 632-95%RICA, >>18%RECA, f/u 1 yr suggested   CAD (coronary artery disease) of bypass graft 5/01; 3/'02, 8/'03, 10/'04; 1/15   PCI x 5 to SVG-D1    CAD in native artery 07/1993   3 Vessel Disease (LAD-D1 & RCA) -- CABG (Dx in setting of inferior STEMI-PTCA of RCA)   CAD S/P percutaneous coronary angioplasty    PCI to SVG-D1 insertion/native D1 x 4 = '01 -(S660 BMS 2.5 x 9 anastomosis- D1); '02 - distal overlap ACS Pixel 2.5 x 8  BMS; '03 distal/native ISR/Thrombosis - Pixel 2.5 x 13; '04 - ISR-  Taxus 2.5 x 20 (covered all);; 1/15 - mid SVG-D1 (50% distal ISR) - Promus P  2.75 x 20 -- 2.8 mm; 6/22: Extensive ISR PTCA & Anastomotic-Native Diag DES PCI (Frontier Onyx 2.25x12 (2.75-2.5 mm post-dilation   COPD mixed type (HCC)    Followed by Dr. Lamonte Sakai "pulmonologist said no COPD"   Depression with anxiety    Diabetes mellitus type 2 in obese ([place])    Diarrhea    started after cholecystectomy and mass removed from intestine   Dyslipidemia, goal LDL below 70    08/2012: TC 137, TG 200, HDL 32!, LDL 45; on statin (followed by Dr.Deterding)   ESRD (end stage renal disease) ([place]) 1991   s/p Cadaveric Renal Transplant [facility] - Dr. Jimmy Footman)    Family history of adverse reaction to  anesthesia    mom's bp dropped during/after anesthesia   Fibromyalgia    GERD (gastroesophageal reflux disease)    Glomerulonephritis, chronic, rapidly progressive 1989   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 07/1993   Rescue PTCA of RCA -- referred for CABG.   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  Had having having lots of nausea and vomiting with poor p.o. intake.  Thought to have QT prolongation with multiple medications involved and hypomagnesemia, hypokalemia.  Tikosyn discontinued along with Zoloft and Phenergan.   Hypertension associated with diabetes ([place])    Mild aortic stenosis by prior echocardiogram 07/2019   Echo:  Mild aortic stenosis (gradients: Mean 14.3 mmHg -peak 24.9 mmHg).   Morbid obesity (HCC)    MRSA (methicillin resistant staph aureus) culture positive    OSA (obstructive sleep apnea)    no longer on CPAP or home O2, states she doesn't need now after lap band   PAD (peripheral artery disease) ([place]) 08/2013   LEA Dopplers to be read by Dr. Fletcher Anon   PAF (paroxysmal atrial fibrillation) ([place]) 06/2014   Noted on CardioNet Monitor  - --> rhythm control with Tikosyn (Dr. Rayann Heman); converted from warfarin to apixaban for anticoagulation.   Pneumonia    Recurrent boils    Bilateral Groin   Rheumatoid arthritis ([place])    Per Patient Report; associated with OA   S/p cadaver renal transplant 1991   [facility]   Torsades de pointes 05/12/2021   Unstable angina ([place]) 5/01; 3/'02, 8/'03, 10/'04; 1/15   x 5 occurences since Inf-Post STEMI in 1994   Past Surgical History:  Procedure Laterality Date   ABDOMINAL AORTOGRAM N/A 04/21/2018   Procedure: ABDOMINAL AORTOGRAM;  Surgeon:  [name], MD;  Location: [place] [facility];  Service: Cardiovascular;  Laterality: N/A;   CATHETER REMOVAL     CHOLECYSTECTOMY N/A 10/29/2014   Procedure: LAPAROSCOPIC CHOLECYSTECTOMY WITH INTRAOPERATIVE CHOLANGIOGRAM;  Surgeon: Excell Seltzer, MD;  Location: [facility];  Service: General;  Laterality: N/A;   CORONARY ANGIOPLASTY  1994   x5   CORONARY ARTERY BYPASS GRAFT  1995   LIMA-LAD, SVG-RPDA, SVG-D1   CORONARY STENT INTERVENTION N/A 02/20/2021   Procedure: PERCUTANEOUS CORONARY STENT INTERVENTION;  Surgeon: [name], MD;  Location: [place] [facility]; ostLCx 60% (Neg RFR 0.96);; SVG- D2 recurrent 90% ISR & 95% native D2 after graft-> DES PCI of 95% anastomotic D2 lesion (Onyx Frontier  2.25 mm x 12 mm => 2.75 mm @ overlap, 2.5 distal.);PTCA of ISR in body of graft. ->  2.5 mm scoring balloon & post-dil w/ 2.75 mm [place] balloon   ESOPHAGOGASTRODUODENOSCOPY N/A 10/15/2016   Procedure: ESOPHAGOGASTRODUODENOSCOPY (EGD);  Surgeon: Wilford Corner, MD;  Location: [facility] ENDOSCOPY;  Service: Endoscopy;  Laterality: N/A;   I & D EXTREMITY Right 01/29/2018   Procedure: IRRIGATION AND DEBRIDEMENT THUMB;  Surgeon: Dayna Barker, MD;  Location: [place];  Service: Plastics;  Laterality: Right;   INCISE AND DRAIN ABCESS     INTRAVASCULAR PRESSURE WIRE/FFR STUDY N/A 02/20/2021   Procedure: INTRAVASCULAR PRESSURE WIRE/FFR STUDY;  Surgeon: [name], MD;  Location: [place] [facility];  Service: Cardiovascular;  Laterality: N/A;   KIDNEY TRANSPLANT  1991   KNEE ARTHROSCOPY WITH LATERAL MENISECTOMY Left 12/03/2017   Procedure: LEFT KNEE ARTHROSCOPY WITH LATERAL MENISECTOMY;  Surgeon: Earlie Server, MD;  Location: [place];  Service: Orthopedics;  Laterality: Left;   LAPAROSCOPIC GASTRIC BANDING  04/2004; 10/'09, 2/'10   Port Replacement x 2   LEFT HEART CATH AND CORONARY ANGIOGRAPHY N/A 02/20/2021   Procedure: LEFT HEART CATH AND CORONARY ANGIOGRAPHY;  Surgeon: [name], MD;  Location:  [place] [facility];  Service: Cardiovascular;  Laterality: N/A;   LEFT HEART CATH AND CORS/GRAFTS ANGIOGRAPHY N/A 04/21/2018   Procedure: LEFT HEART CATH AND CORS/GRAFTS ANGIOGRAPHY;  Surgeon: [name], MD;  Location: [place] [facility];  Ost-Prox LAD 50% - proxLAD (pre & post D1) 100% CTO. Cx - patent, small OM1 (stable ~ ostial OM1 90%, too small for PCI) & 2 small LPL; Ost-distal RCA 100% CTO.  LIMA-LAD (not injected); SVG-dRCA patent, SVG-D1 - insertion stent ~20% ISR - Severe R CFA disease w/ focal Sub TO   LEFT HEART CATH AND CORS/GRAFTS ANGIOGRAPHY  5/'01, 3/'02, 8/'03, 10/'04; 1/'15   08/22/2013: LAD & RCA 100%; LIMA-LAD & SVG-rPDA patent; Cx-- OM1 60%, OM2 ostial ~50%; SVG-D1 - 80% mid, 50% distal ISR --PCI   LEFT HEART CATH AND CORS/GRAFTS ANGIOGRAPHY N/A 01/31/2021   Procedure: LEFT HEART CATH AND CORS/GRAFTS ANGIOGRAPHY;  Surgeon: Jolaine Artist, MD;  Location: [place] [facility];;   LEFT HEART CATH AND CORS/GRAFTS ANGIOGRAPHY N/A 09/05/2021   Procedure: LEFT HEART CATH AND CORS/GRAFTS ANGIOGRAPHY;  Surgeon: [name], MD;  Location: [place] [facility];  Service: Cardiovascular;  Laterality: N/A;   LEFT HEART CATH AND CORS/GRAFTS ANGIOGRAPHY N/A 08/21/2021   Procedure: LEFT HEART CATH AND CORS/GRAFTS ANGIOGRAPHY;  Surgeon: Belva Crome, MD;  Location: [place] [facility];  Service: Cardiovascular;  Laterality: N/A;   LEFT HEART CATHETERIZATION WITH CORONARY/GRAFT ANGIOGRAM N/A 08/23/2013   Procedure: LEFT HEART CATHETERIZATION WITH Beatrix Fetters;  Surgeon: Wellington Hampshire, MD;  Location: [place] C[facility] LAB;  Service: Cardiovascular;  Laterality: N/A;   Lower Extremity Arterial Dopplers  08/2013   ABI: R 0.96, L 1.04   MULTIPLE TOOTH EXTRACTIONS  age 86   NM MYOVIEW LTD  03/2016   EF 62%. LOW RISK. C/W prior MI - no Ischemia. Apical hypokinesis.   PERCUTANEOUS CORONARY STENT INTERVENTION (PCI-S)  5/'01, 3/'02, 8/'03, 10/'04;   '01 - S660 BMS 2.5 x 9 - dSVG-D1 into  D1; '02- post-stent stenosis - 2.5 x 8 Pixel BMS; '8\03: ISR/Thrombosis into native D1 - AngioJet, 2.5 x 13 Pixel; '04 - ISR 95% - covered stented area with Taxus DES 2.5 mm x 20 (2.88)   PERCUTANEOUS CORONARY STENT INTERVENTION (PCI-S)  08/23/2013  Procedure: PERCUTANEOUS CORONARY STENT INTERVENTION (PCI-S);  Surgeon: Wellington Hampshire, MD;  Location: [facility] CATH LAB;  Service: Cardiovascular;;mid SVG-D1 80%; distal stent ~50% ISR; Promus [place] DES 2.75 mm xc 20 mm (2.8 mm)   PORT-A-CATH REMOVAL     kidney   TRANSTHORACIC ECHOCARDIOGRAM  07/2019   a) 07/2019: EF 55 to 60%.  No LVH.  Paradoxical septal WM-s/p CABG.  GRII DD.  Nl RV size and fxn.  Mild bilateral atrial dilation.  Mod MAC.  Trace MR.  Mild AS (gradients: Mean 14.3 mmHg -peak 24.9 mmHg).; B) 06/2020: EF 40 to 45%.  Moderate concentric LVH.  GRII DD.  Elevated LAP.  Mod HK mid Apical Ant-AntSept wall & mild Apical Dyskinesis.  Mod LA dilation.  Mild MR.  AoV sclerosis w/o AS.   TRANSTHORACIC ECHOCARDIOGRAM  01/30/2021   EF 55 to 60%.  Mild LVH.  GR 1 DD.  Elevated LAP.  Moderate LA dilation.  Mild MR with mild MS.  Mild aortic valve stenosis.:   TUBAL LIGATION     wrist fistula repair Left    dialysis for one year   Social History:  reports that she has been smoking cigarettes. She has a 30.00 pack-year smoking history. She has never used smokeless tobacco. She reports that she does not drink alcohol and does not use drugs.  Allergies  Allergen Reactions   Tetracycline Hives    Patient tolerated Doxycycline Dec 2020   Niacin Other (See Comments)    Mouth blisters   Niaspan [Niacin Er] Other (See Comments)    Mouth blisters   Sulfa Antibiotics Nausea Only and Other (See Comments)    "Tears up stomach"   Sulfonamide Derivatives Other (See Comments)    Reaction: per patient "tears her stomach up"   Codeine Nausea And Vomiting   Erythromycin Nausea And Vomiting   Hydromorphone Hcl Nausea And Vomiting   Morphine And Related  Nausea And Vomiting   Nalbuphine Nausea And Vomiting    Nubain   Sulfasalazine Nausea Only and Other (See Comments)    per patient "tears her stomach up", "Tears up stomach"   Tape Rash and Other (See Comments)    No "plastic" tape," please----cloth tape only    Family History  Problem Relation Age of Onset   Cancer Mother        liver   Heart disease Father    Cancer Father        colon   Arrhythmia Brother        Atrial Fibrillation   Arrhythmia Paternal Aunt        Atrial Fibrillation    Prior to Admission medications   Medication Sig Start Date End Date Taking? Authorizing Provider  acetaminophen (TYLENOL) 500 MG tablet Take 1,000 mg by mouth daily.    [provider]  albuterol (VENTOLIN HFA) 108 (90 Base) MCG/ACT inhaler TAKE 2 PUFFS BY MOUTH EVERY 6 HOURS AS NEEDED FOR WHEEZE OR SHORTNESS OF BREATH Patient taking differently: 2 puffs every 6 (six) hours as needed for wheezing or shortness of breath. 02/10/21   Byrum, Rose Fillers, MD  apixaban (ELIQUIS) 5 MG TABS tablet Take 1 tablet (5 mg total) by mouth 2 (two) times daily. APPOINTMENT NEEDED WITH CARDIOLOGIST FOR FURTHER REFILLS Patient taking differently: Take 5 mg by mouth 2 (two) times daily. 04/17/21   Sherran Needs, NP  arformoterol (BROVANA) 15 MCG/2ML NEBU Take 2 mLs (15 mcg total) by nebulization 2 (two) times daily. 08/30/19   Byrum,  Rose Fillers, MD  ascorbic acid (VITAMIN C) 500 MG tablet Take 1 tablet (500 mg total) by mouth daily. Patient not taking: Reported on 09/15/2021 09/12/21   Hosie Poisson, MD  azaTHIOprine (IMURAN) 50 MG tablet Take 125 mg by mouth See admin instructions. Take 2 1/2 tablets (125 mg) by mouth daily at 3pm    [provider]  budesonide (PULMICORT) 0.5 MG/2ML nebulizer solution Take 2 mLs (0.5 mg total) by nebulization 2 (two) times daily. 08/24/19   [name], MD  calcitRIOL (ROCALTROL) 0.25 MCG capsule Take 0.25 mcg by mouth every 3 (three) days.     [provider]  cetirizine (ZYRTEC) 5 MG tablet Take 5 mg by mouth daily.    [provider]  clopidogrel (PLAVIX) 75 MG tablet Take 1 tablet (75 mg total) by mouth daily. 02/22/21   Cheryln Manly, NP  cyclobenzaprine (FLEXERIL) 10 MG tablet Take 1 tablet (10 mg total) by mouth 3 (three) times daily as needed for muscle spasms. 08/25/21   Kathie Dike, MD  Ensure Max Protein (ENSURE MAX PROTEIN) LIQD Take 330 mLs (11 oz total) by mouth 3 (three) times daily between meals. 09/11/21 12/10/21  Hosie Poisson, MD  fluticasone (FLONASE) 50 MCG/ACT nasal spray PLACE 2 SPRAYS INTO BOTH NOSTRILS 2 TIMES DAILY. Patient taking differently: 2 sprays 2 (two) times daily as needed for allergies. 06/27/21   Collene Gobble, MD  gabapentin (NEURONTIN) 100 MG capsule Take 1 capsule (100 mg total) by mouth at bedtime. 09/11/21   Hosie Poisson, MD  glimepiride (AMARYL) 4 MG tablet Take 4 mg by mouth daily. 02/03/20   [provider]  hydrocortisone (ANUSOL-HC) 25 MG suppository Place 1 suppository (25 mg total) rectally 2 (two) times daily. 08/25/21   Kathie Dike, MD  insulin aspart (NOVOLOG FLEXPEN) 100 UNIT/ML FlexPen Inject 4-10 Units into the skin 3 (three) times daily with meals. Per sliding scale :not provided    [provider]  Insulin Glargine (BASAGLAR KWIKPEN) 100 UNIT/ML Inject 20 Units into the skin daily before breakfast. 08/25/21   Kathie Dike, MD  isosorbide mononitrate (IMDUR) 30 MG 24 hr tablet Take 1 tablet (30 mg total) by mouth daily. 09/12/21   Hosie Poisson, MD  lamoTRIgine (LAMICTAL) 25 MG tablet TAKE 1 TABLET BY MOUTH EVERYDAY AT BEDTIME Patient not taking: Reported on 09/15/2021 07/25/21   Meredith Staggers, MD  linaclotide [facility]) 145 MCG CAPS capsule Take 1 capsule (145 mcg total) by mouth daily before breakfast. 08/25/21   Kathie Dike, MD  LORazepam (ATIVAN) 1 MG tablet Take 0.5 tablets (0.5 mg total) by mouth 2 (two) times daily as needed for anxiety. 08/25/21   Kathie Dike, MD  metFORMIN (GLUCOPHAGE) 500 MG tablet Take 1 tablet (500 mg total) by mouth 2 (two) times daily. 02/22/21   Burnell Blanks, MD  metoprolol tartrate (LOPRESSOR) 25 MG tablet Take 1 tablet (25 mg total) by mouth 3 (three) times daily. 09/11/21   Hosie Poisson, MD  montelukast (SINGULAIR) 10 MG tablet TAKE 1 TABLET BY MOUTH EVERYDAY AT BEDTIME Patient taking differently: Take 10 mg by mouth at bedtime. 06/23/21   Collene Gobble, MD  Multiple Vitamin (MULTIVITAMIN WITH MINERALS) TABS tablet Take 1 tablet by mouth daily. 09/12/21   Hosie Poisson, MD  nitroGLYCERIN (NITROSTAT) 0.4 MG SL tablet Place 1 tablet (0.4 mg total) under the tongue every 5 (five) minutes as needed for chest pain. 02/21/21   Burnell Blanks, MD  nutrition  supplement, JUVEN, (JUVEN) PACK Take 1 packet by mouth 2 (two) times daily between meals. 09/11/21 10/11/21  Hosie Poisson, MD  Nutritional Supplements (,FEEDING SUPPLEMENT, PROSOURCE PLUS) liquid Take 30 mLs by mouth 2 (two) times daily between meals. 09/11/21 10/11/21  Hosie Poisson, MD  nystatin (MYCOSTATIN/NYSTOP) powder Apply topically 3 (three) times daily. 08/25/21   Kathie Dike, MD  Oxycodone HCl 10 MG TABS Take 1 tablet (10 mg total) by mouth 4 (four) times daily as needed. Patient taking differently: Take 10 mg by mouth 4 (four) times daily as needed (pain). 07/24/21   Bayard Hugger, NP  OXYGEN Inhale 3 L into the lungs continuous.    [provider]  pantoprazole (PROTONIX) 40 MG tablet TAKE 1 TABLET BY MOUTH EVERY DAY Patient taking differently: 40 mg daily. 07/31/21   [name], MD  polyethylene glycol (MIRALAX / GLYCOLAX) 17 g packet Take 17 g by mouth 2 (two) times daily. 08/25/21   Kathie Dike, MD  predniSONE (DELTASONE) 10 MG tablet Take 50m po daily for 3 days then 150mpo daily for 3 days then 67m63maily 08/25/21   MemKathie DikeD  promethazine (PHENERGAN) 25 MG tablet Take 25 mg by mouth every 6 (six) hours as needed  for nausea or vomiting.    [provider]  ranolazine (RANEXA) 500 MG 12 hr tablet Take 1 tablet (500 mg total) by mouth 2 (two) times daily. 09/11/21   AkuHosie PoissonD  Respiratory Therapy Supplies (NEBULIZER) DEVI Pt needs nebulizr machine dx J44.9 07/17/21   ByrCollene GobbleD  rosuvastatin (CRESTOR) 20 MG tablet Take 1 tablet (20 mg total) by mouth daily. 09/10/21   HarLeonie ManD  senna-docusate (SENOKOT-S) 8.6-50 MG tablet Take 2 tablets by mouth 2 (two) times daily. 08/25/21   MemKathie DikeD  sertraline (ZOLOFT) 100 MG tablet Take 1 tablet (100 mg total) by mouth daily. 06/06/21   HarLeonie ManD  zinc sulfate 220 (50 Zn) MG capsule Take 1 capsule (220 mg total) by mouth daily. 09/12/21   AkuHosie PoissonD    Physical Exam: Vitals:   09/20/21 1430 09/20/21 1434 09/20/21 1500 09/20/21 1515  BP: 118/66  116/73 117/70  Pulse: 98  98 98  Resp: _0 Temp:      TempSrc:      SpO2: 97% 94% 93% 93%  Weight:      Height:       General:  Appears calm and comfortable and is in NAD Eyes:  PERRL, EOMI, normal lids, iris ENT:  grossly normal hearing, lips & tongue, mmm; appropriate dentition Neck:  no LAD, masses or thyromegaly; no carotid bruits Cardiovascular:  RRR, no m/r/g.trace BLE edema   Respiratory:   crackles diffusely. No wheezing on my exam.  Normal respiratory effort. Abdomen:  soft, mild ttp in LUQ, ND, NABS Back:   normal alignment, no CVAT Skin:  no rash or induration seen on limited exam Musculoskeletal:  grossly normal tone BUE/BLE, good ROM, no bony abnormality Lower extremity:   Limited foot exam with no ulcerations.  2+ distal pulses. Psychiatric:  grossly normal mood and affect, speech fluent and appropriate, AOx3 Neurologic:  CN 2-12 grossly intact, moves all extremities in coordinated fashion, sensation intact   Radiological Exams on Admission: Independently reviewed - see discussion in A/P where applicable  DG Chest 2 View  Result  Date: 09/20/2021 CLINICAL DATA:  Difficulty breathing EXAM: CHEST - 2 VIEW COMPARISON:  Previous studies including the examination of 09/07/2021 FINDINGS: Transverse diameter of heart is increased. There is evidence of previous coronary bypass surgery. Central pulmonary vessels are prominent. There is poor inspiration. Increased interstitial markings are seen in both lungs. There is minimal blunting of lateral CP angles. There is no pneumothorax. IMPRESSION: Cardiomegaly. Diffuse increase in interstitial markings in both lungs may suggest pulmonary edema or interstitial pneumonia. Part of this finding may suggest underlying scarring. Possible small bilateral pleural effusions. Electronically Signed   By: Elmer Picker M.D.   On: 09/20/2021 11:48    EKG: Independently reviewed. Sinus tachycardia with rate 110 with LBBB; nonspecific ST changes with no evidence of acute ischemia. Has had LBBB  Repeat NSR rate of 97, LBBB   Labs on Admission: I have personally reviewed the available labs and imaging studies at the time of the admission.  Pertinent labs:   wbc: 14.8,  hgb: 9.2 (8-9),  potassium: 3.2,  mag: 1.3,  troponin: 278>366 BNP: 423  Assessment and Plan: Acute on chronic respiratory failure with hypoxia ([place])- (present on admission) 65 year old female presenting with acute onset shortness of breath with chest pain with oxygen into the 70s at home on her home 4L.  Chronically wears 3-4L oxygen at home, requiring NRB weaned to 8L HFNC -admit to progressive -differential is large including CHF exacerbation COPD exac,  NSTEMI, ? Pneumonia vs. Scarring in lungs. She is on eliquis with no missed doses, PE differential is low at this time.  -will treat above differentials and wean oxygen as tolerated   Acute on chronic systolic CHF (congestive heart failure) ([place])- (present on admission) -acute on chronic respiratory failure with elevated BNP and CXR with pulmonary edema -has NSTEMI and ? If  caused worsening EF vs. Other cause. Hard to determine at this time  -limited echo pending -had echo 12/22 that showed EF of 45-50% with mildly decreased LVF. diastolic undetermined.  -given 48m lasix, will continue this and watch output escalate as needed  -strict I/O and daily weight -weight down 3 pounds from 1/24.   NSTEMI (non-ST elevated myocardial infarction) with CAD S/P percutaneous coronary angioplasty - PCI x 5 to SVG-D1- (present on admission) Troponin 278>366 Symptoms concerning for ACS, chest pain free at time of exam  Cardiology consulted, per last hospital note unsuccessful cath attempt and to cath only if having a STEMI Start heparin Limited echo Continue medical management with crestor, ranexa, NG,lopressor and imdur   ? If end stage cardiac disease: palliative care at home, discuss GOC  COPD (chronic obstructive pulmonary disease) [place]- (present on admission) She has had no wheezing on exam for me, increased cough or sputum production RT states she was wheezing and improved after neb Has been on steroid taper Will give IV dose of steroid today and then change to 463m was on taper at last discharge and appears she is on chronic prednisone 67m68mor her renal transplant  Will continue duonebs/inhalers for now   questionable pneumonia on CXR Recently in hospital, HCAP on differential Meets sepsis criteria with HR, RR and WBC; however, she is on chronic steroids.  No history of recent URI, covid/flu negative Check respiratory virus panel Blood cx and lactic acid  procalcitonin pending  Will give dose of rocephin today, but feel like cardiac is driving her respiratory failure May benefit from CT scan of lungs to assess possible scarring/effusions as well if not improving   Hypokalemia and hypomagnesia - (present on admission) repleted in ED Continue  to trend and replete as indicated   Dysuria- (present on admission) Check UA/culture Receiving dose of rocephin  today for possible pneumonia.   Longstanding persistent atrial fibrillation ([place])- (present on admission) In NSR, continue telemetry  cha2ds2-vasc of 5 Eliquis>heparin gtt Continue lopressor   Type 2 diabetes mellitus with peripheral neuropathy ([place])- (present on admission) a1c of 7.6 in 07/2021 Hold metformin and amaryl, continue long acting insulin SSI and accuchecks per protocol   Essential hypertension- (present on admission) Soft to normal, cuff on wrist Continue lopressor, watch bp   History of renal transplant Renal function stable Continue immunosuppressants: imuran  On chronic prednisone, 33m daily   Dyslipidemia, goal LDL below 70- (present on admission) Continue crestor   Deficiency anemia- (present on admission) Baseline hgb around 8-9 Stable at her baseline, 9.2 Continue to monitor   PAD (peripheral artery disease) ([place]- (present on admission) History of occluded bilateral common iliac arteries and common femoral arteries. Patient is high risk for open surgery. Continue with Plavix and Crestor.  seen by vascular last hospitalization and signed off   Anxiety and depression- (present on admission) Continue zoloft and ativan     Advance Care Planning:   Code Status: DNR   Consults: cardiology: Dr. HDebara Pickett  DVT Prophylaxis: eliquis   Family Communication: called sherry austin, niece, and left VM.   Severity of Illness: The appropriate patient status for this patient is INPATIENT. Inpatient status is judged to be reasonable and necessary in order to provide the required intensity of service to ensure the patient's safety. The patient's presenting symptoms, physical exam findings, and initial radiographic and laboratory data in the context of their chronic comorbidities is felt to place them at high risk for further clinical deterioration. Furthermore, it is not anticipated that the patient will be medically stable for discharge from the hospital within 2  midnights of admission.   * I certify that at the point of admission it is my clinical judgment that the patient will require inpatient hospital care spanning beyond 2 midnights from the point of admission due to high intensity of service, high risk for further deterioration and high frequency of surveillance required.*  Author: AOrma Flaming MD 09/20/2021 4:04 PM  For on call review [email or web address]

## 2021-09-21 ENCOUNTER — Inpatient Hospital Stay (HOSPITAL_COMMUNITY): Payer: Medicare Other

## 2021-09-21 DIAGNOSIS — I2510 Atherosclerotic heart disease of native coronary artery without angina pectoris: Secondary | ICD-10-CM

## 2021-09-21 DIAGNOSIS — I214 Non-ST elevation (NSTEMI) myocardial infarction: Secondary | ICD-10-CM

## 2021-09-21 DIAGNOSIS — Z515 Encounter for palliative care: Secondary | ICD-10-CM

## 2021-09-21 DIAGNOSIS — I509 Heart failure, unspecified: Secondary | ICD-10-CM

## 2021-09-21 LAB — TROPONIN I (HIGH SENSITIVITY)
Troponin I (High Sensitivity): 461 ng/L (ref <18)
Troponin I (High Sensitivity): 400 ng/L (ref <18)

## 2021-09-21 LAB — GLUCOSE, CAPILLARY
Glucose-Capillary: 331 mg/dL — ABNORMAL HIGH (ref 70–99)
Glucose-Capillary: 379 mg/dL — ABNORMAL HIGH (ref 70–99)
Glucose-Capillary: 317 mg/dL — ABNORMAL HIGH (ref 70–99)
Glucose-Capillary: 306 mg/dL — ABNORMAL HIGH (ref 70–99)

## 2021-09-21 LAB — BASIC METABOLIC PANEL
Anion gap: 10 (ref 5–15)
BUN: 16 mg/dL (ref 8–23)
CO2: 28 mmol/L (ref 22–32)
Calcium: 8.9 mg/dL (ref 8.9–10.3)
Chloride: 95 mmol/L — ABNORMAL LOW (ref 98–111)
Creatinine, Ser: 0.89 mg/dL (ref 0.44–1.00)
GFR, Estimated: >60 mL/min (ref >60)
Glucose, Bld: 321 mg/dL — ABNORMAL HIGH (ref 70–99)
Potassium: 4.3 mmol/L (ref 3.5–5.1)
Sodium: 133 mmol/L — ABNORMAL LOW (ref 135–145)

## 2021-09-21 LAB — CBC
HCT: 26.9 % — ABNORMAL LOW (ref 36.0–46.0)
Hemoglobin: 8.4 g/dL — ABNORMAL LOW (ref 12.0–15.0)
MCH: 28.8 pg (ref 26.0–34.0)
MCHC: 31.2 g/dL (ref 30.0–36.0)
MCV: 92.1 fL (ref 80.0–100.0)
Platelets: 363 10*3/uL (ref 150–400)
RBC: 2.92 MIL/uL — ABNORMAL LOW (ref 3.87–5.11)
RDW: 17.6 % — ABNORMAL HIGH (ref 11.5–15.5)
WBC: 8.1 10*3/uL (ref 4.0–10.5)
nRBC: 0 % (ref 0.0–0.2)

## 2021-09-21 LAB — MAGNESIUM: Magnesium: 1.7 mg/dL (ref 1.7–2.4)

## 2021-09-21 LAB — APTT
aPTT: 74 seconds — ABNORMAL HIGH (ref 24–36)
aPTT: 67 seconds — ABNORMAL HIGH (ref 24–36)

## 2021-09-21 LAB — HEPARIN LEVEL (UNFRACTIONATED): Heparin Unfractionated: >1.1 IU/mL — ABNORMAL HIGH (ref 0.30–0.70)

## 2021-09-21 MED ORDER — PERFLUTREN LIPID MICROSPHERE
1.0000 mL | INTRAVENOUS | Status: AC | PRN
  Administered 2021-09-21: 2 mL via INTRAVENOUS
  Filled 2021-09-21: qty 10

## 2021-09-21 MED ORDER — MENTHOL 3 MG MT LOZG
1.0000 | LOZENGE | OROMUCOSAL | Status: DC | PRN
  Administered 2021-09-21 – 2021-09-22 (×2): 3 mg via ORAL
  Filled 2021-09-21: qty 9

## 2021-09-21 MED ORDER — FUROSEMIDE 10 MG/ML IJ SOLN
60.0000 mg | Freq: Two times a day (BID) | INTRAMUSCULAR | Status: DC
  Administered 2021-09-21 (×2): 60 mg via INTRAVENOUS
  Filled 2021-09-21 (×2): qty 6

## 2021-09-21 MED ORDER — SALINE SPRAY 0.65 % NA SOLN
1.0000 | NASAL | Status: DC | PRN
  Administered 2021-09-21: 1 via NASAL
  Filled 2021-09-21: qty 44

## 2021-09-21 MED ORDER — PREDNISONE 20 MG PO TABS
20.0000 mg | ORAL_TABLET | Freq: Every day | ORAL | Status: DC
  Administered 2021-09-22: 20 mg via ORAL
  Filled 2021-09-21: qty 1

## 2021-09-21 MED ORDER — SODIUM CHLORIDE 0.9 % IV SOLN
1.0000 g | INTRAVENOUS | Status: DC
  Administered 2021-09-21: 1 g via INTRAVENOUS
  Filled 2021-09-21: qty 10

## 2021-09-21 MED ORDER — INSULIN ASPART 100 UNIT/ML IJ SOLN
4.0000 [IU] | Freq: Three times a day (TID) | INTRAMUSCULAR | Status: DC
  Administered 2021-09-21 – 2021-09-22 (×3): 4 [IU] via SUBCUTANEOUS

## 2021-09-21 MED ORDER — INSULIN GLARGINE-YFGN 100 UNIT/ML ~~LOC~~ SOLN
20.0000 [IU] | Freq: Every day | SUBCUTANEOUS | Status: DC
  Administered 2021-09-22: 20 [IU] via SUBCUTANEOUS
  Filled 2021-09-21: qty 0.2

## 2021-09-21 NOTE — Progress Notes (Signed)
°  Echocardiogram 2D Echocardiogram with contrast has been performed.  Merrie Roof F 09/21/2021, 5:39 PM

## 2021-09-21 NOTE — Progress Notes (Signed)
Progress Note:  After reviewing the patient's chart, I spoke with attending Dr. Verdell Carmine and the patient's day nurse via secure chat.  Patient was not aware of palliative consult but is open to speaking with palliative medicine team.  She would like to speak with PMT with niece present.    Plan is set for me to speak with patient tomorrow 2/6 at 10 AM with patient and niece present.  Carlisle Ilsa Iha, FNP-BC Palliative Medicine Team Team Phone # [phone]  NO CHARGE

## 2021-09-21 NOTE — Progress Notes (Addendum)
ANTICOAGULATION CONSULT NOTE - Follow Up Consult  Pharmacy Consult for heparin Indication: atrial fibrillation  Allergies  Allergen Reactions   Tetracycline Hives    Patient tolerated Doxycycline Dec 2020   Niacin Other (See Comments)    Mouth blisters   Niaspan [Niacin Er] Other (See Comments)    Mouth blisters   Sulfa Antibiotics Nausea Only and Other (See Comments)    "Tears up stomach"   Sulfonamide Derivatives Other (See Comments)    Reaction: per patient "tears her stomach up"   Codeine Nausea And Vomiting   Erythromycin Nausea And Vomiting   Hydromorphone Hcl Nausea And Vomiting   Morphine And Related Nausea And Vomiting   Nalbuphine Nausea And Vomiting    Nubain   Sulfasalazine Nausea Only and Other (See Comments)    per patient "tears her stomach up", "Tears up stomach"   Tape Rash and Other (See Comments)    No "plastic" tape," please----cloth tape only    Patient Measurements: Height: 5\' 1"  (154.9 cm) Weight: 92.6 kg (204 lb 2.3 oz) IBW/kg (Calculated) : 47.8 Heparin Dosing Weight: 67.7 kg  Vital Signs: Temp: 98.3 F (36.8 C) (02/05 0448) Temp Source: Oral (02/05 0448) BP: 143/77 (02/05 0448) Pulse Rate: 85 (02/05 0448)  Labs: Recent Labs    09/20/21 1102 09/20/21 1302 09/20/21 1827 09/20/21 2045 09/21/21 0638  HGB 9.2*  --   --   --  8.4*  HCT 29.4*  --   --   --  26.9*  PLT 409*  --   --   --  363  APTT  --   --   --   --  74*  HEPARINUNFRC  --   --   --   --  >1.10*  CREATININE 0.89  --   --   --  0.89  TROPONINIHS 278* 366* 457* 563*  --     Estimated Creatinine Clearance: 66.2 mL/min (by C-G formula based on SCr of 0.89 mg/dL).  Assessment: 66 yo F admitted 2/4 with shortness of breath. Hx of afib on Eliquis at home. Last dose of Eliquis was 2/4 around 1000. Pharmacy consulted to transition to IV heparin for concern for ACS.   Most recent aPTT therapeutic at 74; most recent heparin level elevated at >1.1 due to recent Eliquis use and  levels not yet correlating. CBC is stable. No issues noted per chart review.   Goal of Therapy:  Heparin level 0.3-0.7 units/ml aPTT 66-102 seconds Monitor platelets by anticoagulation protocol: Yes   Plan:  Continue heparin 1000 units/hr  Confirmatory aPTT in 6h Daily aPTT, heparin level, CBC and s/sx of bleeding  Thank you for including pharmacy in the care of this patient.  Zenaida Deed, PharmD PGY1 Acute Care Pharmacy Resident  Phone: [phone] 09/21/2021  8:09 AM  ------------------------------------------------------------------  Addendum:  Confirmatory aPTT therapeutic at 67, but on the low end of goal. Will empirically increase rate to heparin 1050 units/hr and will continue to monitor daily PTT, heparin level, CBC and s/sx of bleeding.  Zenaida Deed, PharmD PGY1 Acute Care Pharmacy Resident  Phone: [phone] 09/21/2021  1:11 PM  Please check [email or web address] for unit-specific pharmacy phone numbers.

## 2021-09-21 NOTE — Progress Notes (Signed)
DAILY PROGRESS NOTE   Patient Name: Katherine [name] Date of Encounter: 09/21/2021 Cardiologist: [name], MD  Chief Complaint   Feels the same  Patient Profile   66 yo female with recent COVID 25 in December 2022, history of CABG and PCI, ESRD s/p renal transplant, recently admitted with unstable angina - however, 2 attempts at recent cardiac catheterization were unsuccessful d/t inability to get vascular access. She now presents with NSTEMI and symptoms of acute heart failure.  Subjective   No issues noted overnight. Minimally net positive output. Troponin had gone up to 563 and now trending down to 461. On IV heparin. Antianginal medications increased. No plans for cath due to inability to access the coronaries.  Objective   Vitals:   09/20/21 2343 09/21/21 0335 09/21/21 0448 09/21/21 0910  BP: 127/73  (!) 143/77 128/80  Pulse: 91  85 84  Resp: 13  16 20   Temp: 98.4 F (36.9 C)  98.3 F (36.8 C)   TempSrc: Oral  Oral   SpO2: 95% 95% 100% 96%  Weight:   92.6 kg   Height:        Intake/Output Summary (Last 24 hours) at 09/21/2021 1044 Last data filed at 09/21/2021 0844 Gross per 24 hour  Intake 669.4 ml  Output 300 ml  Net 369.4 ml   Filed Weights   09/20/21 1052 09/21/21 0448  Weight: 86.2 kg 92.6 kg    Physical Exam   General appearance: alert and no distress Lungs: clear to auscultation bilaterally Heart: regular rate and rhythm Extremities: extremities normal, atraumatic, no cyanosis or edema Neurologic: Grossly normal  Inpatient Medications    Scheduled Meds:  arformoterol  15 mcg Nebulization BID   azaTHIOprine  125 mg Oral Q1500   budesonide  2 mL Nebulization BID   clopidogrel  75 mg Oral Daily   furosemide  60 mg Intravenous Q12H   gabapentin  100 mg Oral QHS   insulin aspart  0-15 Units Subcutaneous TID WC   insulin glargine-yfgn  12 Units Subcutaneous QAC breakfast   isosorbide mononitrate  60 mg Oral Daily   lamoTRIgine  25 mg Oral  QHS   metoprolol tartrate  25 mg Oral TID   montelukast  10 mg Oral QHS   pantoprazole  40 mg Oral Daily   predniSONE  40 mg Oral Q breakfast   ranolazine  1,000 mg Oral BID   rosuvastatin  20 mg Oral Daily   sertraline  100 mg Oral Daily   sodium chloride flush  3 mL Intravenous Q12H    Continuous Infusions:  sodium chloride     heparin 1,000 Units/hr (09/21/21 0653)    PRN Meds: sodium chloride, acetaminophen **OR** acetaminophen, LORazepam, menthol-cetylpyridinium, nitroGLYCERIN, ondansetron **OR** ondansetron (ZOFRAN) IV, oxyCODONE, sodium chloride flush   Labs   Results for orders placed or performed during the hospital encounter of 09/20/21 (from the past 48 hour(s))  Basic metabolic panel     Status: Abnormal   Collection Time: 09/20/21 11:02 AM  Result Value Ref Range   Sodium 135 135 - 145 mmol/L   Potassium 3.2 (L) 3.5 - 5.1 mmol/L   Chloride 96 (L) 98 - 111 mmol/L   CO2 25 22 - 32 mmol/L   Glucose, Bld 216 (H) 70 - 99 mg/dL    Comment: Glucose reference range applies only to samples taken after fasting for at least 8 hours.   BUN 16 8 - 23 mg/dL   Creatinine, Ser 0.89 0.44 -  1.00 mg/dL   Calcium 9.1 8.9 - 10.3 mg/dL   GFR, Estimated >60 >60 mL/min    Comment: (NOTE) Calculated using the CKD-EPI Creatinine Equation (2021)    Anion gap 14 5 - 15    Comment: Performed at [facility], [place] [address], [place], [place] [address]  CBC with Differential     Status: Abnormal   Collection Time: 09/20/21 11:02 AM  Result Value Ref Range   WBC 14.8 (H) 4.0 - 10.5 K/uL   RBC 3.16 (L) 3.87 - 5.11 MIL/uL   Hemoglobin 9.2 (L) 12.0 - 15.0 g/dL   HCT 29.4 (L) 36.0 - 46.0 %   MCV 93.0 80.0 - 100.0 fL   MCH 29.1 26.0 - 34.0 pg   MCHC 31.3 30.0 - 36.0 g/dL   RDW 17.5 (H) 11.5 - 15.5 %   Platelets 409 (H) 150 - 400 K/uL   nRBC 0.0 0.0 - 0.2 %   Neutrophils Relative % 82 %   Neutro Abs 12.2 (H) 1.7 - 7.7 K/uL   Lymphocytes Relative 7 %   Lymphs Abs 1.1 0.7 - 4.0  K/uL   Monocytes Relative 8 %   Monocytes Absolute 1.1 (H) 0.1 - 1.0 K/uL   Eosinophils Relative 2 %   Eosinophils Absolute 0.3 0.0 - 0.5 K/uL   Basophils Relative 0 %   Basophils Absolute 0.1 0.0 - 0.1 K/uL   Immature Granulocytes 1 %   Abs Immature Granulocytes 0.12 (H) 0.00 - 0.07 K/uL    Comment: Performed at [facility], [place] [address], [place], [place] [address]  Magnesium     Status: Abnormal   Collection Time: 09/20/21 11:02 AM  Result Value Ref Range   Magnesium 1.3 (L) 1.7 - 2.4 mg/dL    Comment: Performed at [facility] [address], [place], [place] [address]  Troponin I (High Sensitivity)     Status: Abnormal   Collection Time: 09/20/21 11:02 AM  Result Value Ref Range   Troponin I (High Sensitivity) 278 (HH) <18 ng/L    Comment: CRITICAL RESULT CALLED TO, READ BACK BY AND VERIFIED WITH: C COGAN,RN 09/20/2021 1304 WILDERK (NOTE) Elevated high sensitivity troponin I (hsTnI) values and significant  changes across serial measurements may suggest ACS but many other  chronic and acute conditions are known to elevate hsTnI results.  Refer to the Links section for chest pain algorithms and additional  guidance. Performed at [facility], [place] [address], [place], [place] [address]   Brain natriuretic peptide     Status: Abnormal   Collection Time: 09/20/21 11:26 AM  Result Value Ref Range   B Natriuretic Peptide 423.8 (H) 0.0 - 100.0 pg/mL    Comment: Performed at [facility] [address] [place], [place] [address]  Troponin I (High Sensitivity)     Status: Abnormal   Collection Time: 09/20/21  1:02 PM  Result Value Ref Range   Troponin I (High Sensitivity) 366 (HH) <18 ng/L    Comment: CRITICAL VALUE NOTED.  VALUE IS CONSISTENT WITH PREVIOUSLY REPORTED AND CALLED VALUE. (NOTE) Elevated high sensitivity troponin I (hsTnI) values and significant  changes across serial measurements may suggest ACS but many other  chronic and acute  conditions are known to elevate hsTnI results.  Refer to the Links section for chest pain algorithms and additional  guidance. Performed at [facility], [place] [address] [place], [place] [address]   Procalcitonin - Baseline  Status: None   Collection Time: 09/20/21  1:15 PM  Result Value Ref Range   Procalcitonin <0.10 ng/mL    Comment:        Interpretation: PCT (Procalcitonin) <= 0.5 ng/mL: Systemic infection (sepsis) is not likely. Local bacterial infection is possible. (NOTE)       Sepsis PCT Algorithm           Lower Respiratory Tract                                      Infection PCT Algorithm    ----------------------------     ----------------------------         PCT < 0.25 ng/mL                PCT < 0.10 ng/mL          Strongly encourage             Strongly discourage   discontinuation of antibiotics    initiation of antibiotics    ----------------------------     -----------------------------       PCT 0.25 - 0.50 ng/mL            PCT 0.10 - 0.25 ng/mL               OR       >80% decrease in PCT            Discourage initiation of                                            antibiotics      Encourage discontinuation           of antibiotics    ----------------------------     -----------------------------         PCT >= 0.50 ng/mL              PCT 0.26 - 0.50 ng/mL               AND        <80% decrease in PCT             Encourage initiation of                                             antibiotics       Encourage continuation           of antibiotics    ----------------------------     -----------------------------        PCT >= 0.50 ng/mL                  PCT > 0.50 ng/mL               AND         increase in PCT                  Strongly encourage                                      initiation of antibiotics    Strongly  encourage escalation           of antibiotics                                     -----------------------------                                            PCT <= 0.25 ng/mL                                                 OR                                        > 80% decrease in PCT                                      Discontinue / Do not initiate                                             antibiotics  Performed at [facility], [place] [address], [place], [place] [address]   Urinalysis, Routine w reflex microscopic Urine, Clean Catch     Status: Abnormal   Collection Time: 09/20/21  2:29 PM  Result Value Ref Range   Color, Urine YELLOW YELLOW   APPearance CLEAR CLEAR   Specific Gravity, Urine 1.015 1.005 - 1.030   pH 6.0 5.0 - 8.0   Glucose, UA NEGATIVE NEGATIVE mg/dL   Hgb urine dipstick NEGATIVE NEGATIVE   Bilirubin Urine NEGATIVE NEGATIVE   Ketones, ur NEGATIVE NEGATIVE mg/dL   Protein, ur NEGATIVE NEGATIVE mg/dL   Nitrite POSITIVE (A) NEGATIVE   Leukocytes,Ua MODERATE (A) NEGATIVE    Comment: Performed at [facility] [address], [place], [place] [address]  Urine Culture     Status: Abnormal (Preliminary result)   Collection Time: 09/20/21  2:29 PM   Specimen: Urine, Clean Catch  Result Value Ref Range   Specimen Description URINE, CLEAN CATCH    Special Requests NONE    Culture (A)     >=100,000 COLONIES/mL ESCHERICHIA COLI SUSCEPTIBILITIES TO FOLLOW Performed at [facility], [place] [address] [place], [place] [address]    Report Status PENDING   Urinalysis, Microscopic (reflex)     Status: Abnormal   Collection Time: 09/20/21  2:29 PM  Result Value Ref Range   RBC / HPF 6-10 0 - 5 RBC/hpf   WBC, UA >50 0 - 5 WBC/hpf   Bacteria, UA RARE (A) NONE SEEN   Squamous Epithelial / LPF 0-5 0 - 5    Comment: Performed at [facility], [place] [address] [place], [place] [address]  Glucose, capillary     Status: Abnormal   Collection Time: 09/20/21  5:24 PM  Result Value Ref Range   Glucose-Capillary 273 (H) 70 - 99 mg/dL  Comment: Glucose reference range applies only to  samples taken after fasting for at least 8 hours.  Lactic acid, plasma     Status: None   Collection Time: 09/20/21  6:27 PM  Result Value Ref Range   Lactic Acid, Venous 1.8 0.5 - 1.9 mmol/L    Comment: Performed at [facility], [place] [address], [place], [place] [address]  Troponin I (High Sensitivity)     Status: Abnormal   Collection Time: 09/20/21  6:27 PM  Result Value Ref Range   Troponin I (High Sensitivity) 457 (HH) <18 ng/L    Comment: CRITICAL VALUE NOTED.  VALUE IS CONSISTENT WITH PREVIOUSLY REPORTED AND CALLED VALUE. (NOTE) Elevated high sensitivity troponin I (hsTnI) values and significant  changes across serial measurements may suggest ACS but many other  chronic and acute conditions are known to elevate hsTnI results.  Refer to the Links section for chest pain algorithms and additional  guidance. Performed at [facility], [place] [address] [name], [place] [address]   Respiratory (~20 pathogens) panel by PCR     Status: None   Collection Time: 09/20/21  7:02 PM   Specimen: Nasopharyngeal Swab; Respiratory  Result Value Ref Range   Adenovirus NOT DETECTED NOT DETECTED   Coronavirus 229E NOT DETECTED NOT DETECTED    Comment: (NOTE) The Coronavirus on the Respiratory Panel, DOES NOT test for the novel  Coronavirus (2019 nCoV)    Coronavirus HKU1 NOT DETECTED NOT DETECTED   Coronavirus NL63 NOT DETECTED NOT DETECTED   Coronavirus OC43 NOT DETECTED NOT DETECTED   Metapneumovirus NOT DETECTED NOT DETECTED   Rhinovirus / Enterovirus NOT DETECTED NOT DETECTED   Influenza A NOT DETECTED NOT DETECTED   Influenza B NOT DETECTED NOT DETECTED   Parainfluenza Virus 1 NOT DETECTED NOT DETECTED   Parainfluenza Virus 2 NOT DETECTED NOT DETECTED   Parainfluenza Virus 3 NOT DETECTED NOT DETECTED   Parainfluenza Virus 4 NOT DETECTED NOT DETECTED   Respiratory Syncytial Virus NOT DETECTED NOT DETECTED   Bordetella pertussis NOT DETECTED NOT DETECTED   Bordetella  Parapertussis NOT DETECTED NOT DETECTED   Chlamydophila pneumoniae NOT DETECTED NOT DETECTED   Mycoplasma pneumoniae NOT DETECTED NOT DETECTED    Comment: Performed at [facility], [place] [address] [place], [place] [address]  Lactic acid, plasma     Status: Abnormal   Collection Time: 09/20/21  8:45 PM  Result Value Ref Range   Lactic Acid, Venous 2.4 (HH) 0.5 - 1.9 mmol/L    Comment: CRITICAL RESULT CALLED TO, READ BACK BY AND VERIFIED WITH:  Q. CLINE, RN, 2157, 09/20/21, E. ADEDOKUN. Performed at [facility], [place] [address] [place],  [address]   Troponin I (High Sensitivity)     Status: Abnormal   Collection Time: 09/20/21  8:45 PM  Result Value Ref Range   Troponin I (High Sensitivity) 563 (HH) <18 ng/L    Comment: CRITICAL VALUE NOTED.  VALUE IS CONSISTENT WITH PREVIOUSLY REPORTED AND CALLED VALUE. (NOTE) Elevated high sensitivity troponin I (hsTnI) values and significant  changes across serial measurements may suggest ACS but many other  chronic and acute conditions are known to elevate hsTnI results.  Refer to the Links section for chest pain algorithms and additional  guidance. Performed at [facility], [place] [address], [place], [place] [address]   Glucose, capillary     Status: Abnormal   Collection Time: 09/20/21  9:39 PM  Result Value Ref  Range   Glucose-Capillary 257 (H) 70 - 99 mg/dL    Comment: Glucose reference range applies only to samples taken after fasting for at least 8 hours.  APTT     Status: Abnormal   Collection Time: 09/21/21  6:38 AM  Result Value Ref Range   aPTT 74 (H) 24 - 36 seconds    Comment:        IF BASELINE aPTT IS ELEVATED, SUGGEST PATIENT RISK ASSESSMENT BE USED TO DETERMINE APPROPRIATE ANTICOAGULANT THERAPY. Performed at [facility], [place] [address], [place], [place] [address]   Heparin level (unfractionated)     Status: Abnormal   Collection Time: 09/21/21  6:38 AM  Result Value Ref Range   Heparin  Unfractionated >1.10 (H) 0.30 - 0.70 IU/mL    Comment: (NOTE) The clinical reportable range upper limit is being lowered to >1.10 to align with the FDA approved guidance for the current laboratory assay.  If heparin results are below expected values, and patient dosage has  been confirmed, suggest follow up testing of antithrombin III levels. Performed at [facility], [place] [address], [place], [place] [address]   CBC     Status: Abnormal   Collection Time: 09/21/21  6:38 AM  Result Value Ref Range   WBC 8.1 4.0 - 10.5 K/uL   RBC 2.92 (L) 3.87 - 5.11 MIL/uL   Hemoglobin 8.4 (L) 12.0 - 15.0 g/dL   HCT 26.9 (L) 36.0 - 46.0 %   MCV 92.1 80.0 - 100.0 fL   MCH 28.8 26.0 - 34.0 pg   MCHC 31.2 30.0 - 36.0 g/dL   RDW 17.6 (H) 11.5 - 15.5 %   Platelets 363 150 - 400 K/uL   nRBC 0.0 0.0 - 0.2 %    Comment: Performed at [facility], [place] [address], [place], [place] [address]  Basic metabolic panel     Status: Abnormal   Collection Time: 09/21/21  6:38 AM  Result Value Ref Range   Sodium 133 (L) 135 - 145 mmol/L   Potassium 4.3 3.5 - 5.1 mmol/L   Chloride 95 (L) 98 - 111 mmol/L   CO2 28 22 - 32 mmol/L   Glucose, Bld 321 (H) 70 - 99 mg/dL    Comment: Glucose reference range applies only to samples taken after fasting for at least 8 hours.   BUN 16 8 - 23 mg/dL   Creatinine, Ser 0.89 0.44 - 1.00 mg/dL   Calcium 8.9 8.9 - 10.3 mg/dL   GFR, Estimated >60 >60 mL/min    Comment: (NOTE) Calculated using the CKD-EPI Creatinine Equation (2021)    Anion gap 10 5 - 15    Comment: Performed at [facility] [address], [place], [place] [address]  Magnesium     Status: None   Collection Time: 09/21/21  6:38 AM  Result Value Ref Range   Magnesium 1.7 1.7 - 2.4 mg/dL    Comment: Performed at [facility] [address] [place], [place] [address]  Glucose, capillary     Status: Abnormal   Collection Time: 09/21/21  7:19 AM  Result Value Ref Range    Glucose-Capillary 331 (H) 70 - 99 mg/dL    Comment: Glucose reference range applies only to samples taken after fasting for at least 8 hours.   Comment 1 Notify RN   Troponin I (High Sensitivity)     Status: Abnormal   Collection Time: 09/21/21  7:52 AM  Result Value Ref Range   Troponin I (High Sensitivity) 461 (HH) <18 ng/L    Comment: CRITICAL VALUE NOTED.  VALUE IS CONSISTENT WITH PREVIOUSLY REPORTED AND CALLED VALUE. (NOTE) Elevated high sensitivity troponin I (hsTnI) values and significant  changes across serial measurements may suggest ACS but many other  chronic and acute conditions are known to elevate hsTnI results.  Refer to the Links section for chest pain algorithms and additional  guidance. Performed at [facility], [place] [address] [place], [place] [address]    *Note: Due to a large number of results and/or encounters for the requested time period, some results have not been displayed. A complete set of results can be found in Results Review.    ECG   N/A  Telemetry   NSR - Personally Reviewed  Radiology    DG Chest 2 View  Result Date: 09/20/2021 CLINICAL DATA:  Difficulty breathing EXAM: CHEST - 2 VIEW COMPARISON:  Previous studies including the examination of 09/07/2021 FINDINGS: Transverse diameter of heart is increased. There is evidence of previous coronary bypass surgery. Central pulmonary vessels are prominent. There is poor inspiration. Increased interstitial markings are seen in both lungs. There is minimal blunting of lateral CP angles. There is no pneumothorax. IMPRESSION: Cardiomegaly. Diffuse increase in interstitial markings in both lungs may suggest pulmonary edema or interstitial pneumonia. Part of this finding may suggest underlying scarring. Possible small bilateral pleural effusions. Electronically Signed   By: Elmer Picker M.D.   On: 09/20/2021 11:48    Cardiac Studies   N/A  Assessment   Active Problems:   Hypokalemia and  hypomagnesia    Dyslipidemia, goal LDL below 70   NSTEMI (non-ST elevated myocardial infarction) with CAD S/P percutaneous coronary angioplasty - PCI x 5 to SVG-D1   Essential hypertension   PAD (peripheral artery disease) (HCC)   Type 2 diabetes mellitus with peripheral neuropathy (HCC)   Deficiency anemia   Anxiety and depression   Acute on chronic systolic CHF (congestive heart failure) (HCC)   COPD (chronic obstructive pulmonary disease) ([place])   History of renal transplant   Acute on chronic respiratory failure with hypoxia (HCC)   Longstanding persistent atrial fibrillation ([place])   questionable pneumonia on CXR   Dysuria   Plan   Chest pain has resolved for the moment - I have maximized anti-anginal medicines. Would continue heparin for another 24 hours and then stop. Recommend re-consult palliative care for further pain management options and to discuss plans to avoid re-admittance. She could qualify for hospice in my opinion for refractory angina.  Time Spent Directly with Patient:  I have spent a total of 25 minutes with the patient reviewing hospital notes, telemetry, EKGs, labs and examining the patient as well as establishing an assessment and plan that was discussed personally with the patient.  > 50% of time was spent in direct patient care.  Length of Stay:  LOS: 1 day   Pixie Casino, MD, [facility], [place] Director of the Advanced Lipid Disorders &  Cardiovascular Risk Reduction Clinic Diplomate of the American Board of Clinical Lipidology Attending Cardiologist  Direct Dial: [phone]   Fax: [phone]  Website:  [email or web address] 09/21/2021, 10:44 AM

## 2021-09-21 NOTE — Progress Notes (Addendum)
PROGRESS NOTE    Katherine [name]  SVX:[id number] DOB: 1956/06/23 DOA: 09/20/2021 PCP: [name], MD  Brief [id number]:64/F w/ complicated h/o CAD s/p CABG and PCI, bilateral carotid artery stenosis, PAD, PAF on Eliquis, COPD, chronic respiratory failure with hypoxia on home oxygen 3 L per minute, IDDM2, s/p renal transplant on immunosuppressant,  urolithiasis, anemia, history of GI bleed,fibromyalgia/chronic pain, recent hospitalization from 07/25/2021-08/25/2021, for COVID with MRSA pneumonia. 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 -Readmitted with angina, NSTEMI 1/17-1/26, heart cath was reattempted 1/20, this was unsuccessful, she was then seen by palliative care, made DNR and discharged home on Imdur, Ranexa, beta-blocker. -Back in the ED 2/4 with shortness of breath and chest pain, leg swelling -In the ED, O2 needs were higher 6 to 7 L, BNP 423, troponin 278, chest x-ray noted interstitial edema and small bilateral effusions   Subjective: -Upset about limited options, denies chest pain this morning, has some shortness of breath  Assessment & Plan:  Acute on chronic respiratory failure with hypoxia (HCC)- (present on admission) Acute on chronic systolic and diastolic CHF -With complex CAD history -Last echo in December with EF 40-45% -Presented with worsening dyspnea, orthopnea and hypoxia, required nonrebreather mask on admission, down to 7 L this morning -Poor response to diuretics so far, increase Lasix to 60 mg twice daily -Repeat echo today -Wean O2 as tolerated  NSTEMI (non-ST elevated myocardial infarction) with CAD S/P percutaneous coronary angioplasty - PCI x 5 to SVG-D1- (present on admission) Troponin peaked at 563 -Continue IV heparin -Continue plavix, metoprolol, Imdur, statin,  Ranexa -Follow-up repeat echo -2 recent unsuccessful left heart cath attempts on 1/5 and 1/20 -Seen by palliative care over a week ago, now DNR, briefly discussed potential need for hospice services at home, she became very tearful  Longstanding persistent atrial fibrillation ([place])- (present on admission) - In sinus rhythm, continue metoprolol -Eliquis on hold, continue heparin  Type 2 diabetes mellitus with peripheral neuropathy ([place])- (present on admission) a1c of 7.6 in 07/2021 Hold metformin and amaryl, continue long acting insulin -CBGs poorly controlled, will increase glargine dose  COPD (chronic obstructive pulmonary disease) ([place])- (present on admission) Chronic respiratory failure on 3 L home O2 -No wheezing at this time, will taper prednisone, continue duo nebs -she is on chronic prednisone 5mg  for her renal transplant   PAD (peripheral artery disease) ([place])- (present on admission) History of occluded bilateral common iliac arteries and common femoral arteries. Patient is high risk for open surgery. Continue with Plavix and Crestor.  seen by vascular last hospitalization  Dysuria- (present on admission) - UA is abnormal, resume ceftriaxone, follow-up cultures  questionable pneumonia on CXR Clinically do not suspect pneumonia, monitor off antibiotics  History of renal transplant -Continue immunosuppressants: imuran  On chronic prednisone, 5mg  daily   Anxiety and depression- (present on admission) Continue zoloft and ativan   Deficiency anemia- (present on admission) Hemoglobin stable, close to baseline  Essential hypertension- (present on admission) Continue Lopressor, Lasix  Dyslipidemia, goal LDL below 70- (present on admission) Continue crestor   Hypokalemia and hypomagnesia - (present on admission) Repleted   DVT prophylaxis: Heparin Code Status: DNR Family Communication: Discussed with patient detail, no family at bedside Disposition Plan:    Consultants:  Cardiology  Procedures:   Antimicrobials:    Objective: Vitals:   09/20/21  2343 09/21/21 0335 09/21/21 0448 09/21/21 0910  BP: 127/73  (!) 143/77 128/80  Pulse: 91  85 84  Resp: 13  16 20   Temp: 42.5 F (95.6 C)  98.3 F (36.8 C)   TempSrc: Oral  Oral   SpO2: 95% 95% 100% 96%  Weight:   92.6 kg   Height:        Intake/Output Summary (Last 24 hours) at 09/21/2021 1115 Last data filed at 09/21/2021 0844 Gross per 24 hour  Intake 669.4 ml  Output 300 ml  Net 369.4 ml   Filed Weights   09/20/21 1052 09/21/21 0448  Weight: 86.2 kg 92.6 kg    Examination:  General exam: Chronically ill pleasant female appears much older than stated age, AAOx3 HEENT: No JVD CVS: S1-S2, regular rate rhythm Lungs: Poor air movement bilaterally, few basilar rales Abdomen: Soft, obese, nontender, bowel sounds present Extremities: No edema  Skin: No rashes Psychiatry: Tearful    Data Reviewed:   CBC: Recent Labs  Lab 09/20/21 1102 09/21/21 0638  WBC 14.8* 8.1  NEUTROABS 12.2*  --   HGB 9.2* 8.4*  HCT 29.4* 26.9*  MCV 93.0 92.1  PLT 409* 387   Basic Metabolic Panel: Recent Labs  Lab 09/20/21 1102 09/21/21 0638  NA 135 133*  K 3.2* 4.3  CL 96* 95*  CO2 25 28  GLUCOSE 216* 321*  BUN 16 16  CREATININE 0.89 0.89  CALCIUM 9.1 8.9  MG 1.3* 1.7   GFR: Estimated Creatinine Clearance: 66.2 mL/min (by C-G formula based on SCr of 0.89 mg/dL). Liver Function Tests: No results for input(s): AST, ALT, ALKPHOS, BILITOT, PROT, ALBUMIN in the last 168 hours. No results for input(s): LIPASE, AMYLASE in the last 168 hours. No results for input(s): AMMONIA in the last 168 hours. Coagulation Profile: No results for input(s): INR, PROTIME in the last 168 hours. Cardiac Enzymes: No results for input(s): CKTOTAL, CKMB, CKMBINDEX, TROPONINI in the last 168 hours. BNP (last 3 results) No results for input(s): PROBNP in the last 8760 hours. HbA1C: No results for  input(s): HGBA1C in the last 72 hours. CBG: Recent Labs  Lab 09/20/21 1724 09/20/21 2139 09/21/21 0719  GLUCAP 273* 257* 331*   Lipid Profile: No results for input(s): CHOL, HDL, LDLCALC, TRIG, CHOLHDL, LDLDIRECT in the last 72 hours. Thyroid Function Tests: No results for input(s): TSH, T4TOTAL, FREET4, T3FREE, THYROIDAB in the last 72 hours. Anemia Panel: No results for input(s): VITAMINB12, FOLATE, FERRITIN, TIBC, IRON, RETICCTPCT in the last 72 hours. Urine analysis:    Component Value Date/Time   COLORURINE YELLOW 09/20/2021 1429   APPEARANCEUR CLEAR 09/20/2021 1429   LABSPEC 1.015 09/20/2021 1429   PHURINE 6.0 09/20/2021 1429   GLUCOSEU NEGATIVE 09/20/2021 1429   GLUCOSEU 100 (A) 08/13/2020 1550   HGBUR NEGATIVE 09/20/2021 1429   BILIRUBINUR NEGATIVE 09/20/2021 1429   KETONESUR NEGATIVE 09/20/2021 1429   PROTEINUR NEGATIVE 09/20/2021 1429   UROBILINOGEN 0.2 08/13/2020 1550   NITRITE POSITIVE (A) 09/20/2021 1429   LEUKOCYTESUR MODERATE (A) 09/20/2021 1429   Sepsis Labs: @LABRCNTIP (procalcitonin:4,lacticidven:4)  ) Recent Results (from the past 240 hour(s))  Urine Culture     Status: Abnormal (Preliminary result)   Collection Time: 09/20/21  2:29 PM   Specimen: Urine, Clean Catch  Result Value Ref Range Status   Specimen Description URINE, CLEAN CATCH  Final   Special Requests NONE  Final   Culture (A)  Final    >=100,000 COLONIES/mL ESCHERICHIA COLI SUSCEPTIBILITIES TO FOLLOW Performed at  [facility], [place] [address], [place], [place] [address]    Report Status PENDING  Incomplete  Respiratory (~20 pathogens) panel by PCR     Status: None   Collection Time: 09/20/21  7:02 PM   Specimen: Nasopharyngeal Swab; Respiratory  Result Value Ref Range Status   Adenovirus NOT DETECTED NOT DETECTED Final   Coronavirus 229E NOT DETECTED NOT DETECTED Final    Comment: (NOTE) The Coronavirus on the Respiratory Panel, DOES NOT test for the novel  Coronavirus (2019  nCoV)    Coronavirus HKU1 NOT DETECTED NOT DETECTED Final   Coronavirus NL63 NOT DETECTED NOT DETECTED Final   Coronavirus OC43 NOT DETECTED NOT DETECTED Final   Metapneumovirus NOT DETECTED NOT DETECTED Final   Rhinovirus / Enterovirus NOT DETECTED NOT DETECTED Final   Influenza A NOT DETECTED NOT DETECTED Final   Influenza B NOT DETECTED NOT DETECTED Final   Parainfluenza Virus 1 NOT DETECTED NOT DETECTED Final   Parainfluenza Virus 2 NOT DETECTED NOT DETECTED Final   Parainfluenza Virus 3 NOT DETECTED NOT DETECTED Final   Parainfluenza Virus 4 NOT DETECTED NOT DETECTED Final   Respiratory Syncytial Virus NOT DETECTED NOT DETECTED Final   Bordetella pertussis NOT DETECTED NOT DETECTED Final   Bordetella Parapertussis NOT DETECTED NOT DETECTED Final   Chlamydophila pneumoniae NOT DETECTED NOT DETECTED Final   Mycoplasma pneumoniae NOT DETECTED NOT DETECTED Final    Comment: Performed at [facility], [place]. [address], [place], [place] [address]     Radiology Studies: DG Chest 2 View  Result Date: 09/20/2021 CLINICAL DATA:  Difficulty breathing EXAM: CHEST - 2 VIEW COMPARISON:  Previous studies including the examination of 09/07/2021 FINDINGS: Transverse diameter of heart is increased. There is evidence of previous coronary bypass surgery. Central pulmonary vessels are prominent. There is poor inspiration. Increased interstitial markings are seen in both lungs. There is minimal blunting of lateral CP angles. There is no pneumothorax. IMPRESSION: Cardiomegaly. Diffuse increase in interstitial markings in both lungs may suggest pulmonary edema or interstitial pneumonia. Part of this finding may suggest underlying scarring. Possible small bilateral pleural effusions. Electronically Signed   By: Elmer Picker M.D.   On: 09/20/2021 11:48     Scheduled Meds:  arformoterol  15 mcg Nebulization BID   azaTHIOprine  125 mg Oral Q1500   budesonide  2 mL Nebulization BID   clopidogrel  75  mg Oral Daily   furosemide  60 mg Intravenous Q12H   gabapentin  100 mg Oral QHS   insulin aspart  0-15 Units Subcutaneous TID WC   insulin aspart  4 Units Subcutaneous TID WC   [START ON 09/22/2021] insulin glargine-yfgn  20 Units Subcutaneous QAC breakfast   isosorbide mononitrate  60 mg Oral Daily   lamoTRIgine  25 mg Oral QHS   metoprolol tartrate  25 mg Oral TID   montelukast  10 mg Oral QHS   pantoprazole  40 mg Oral Daily   [START ON 09/22/2021] predniSONE  20 mg Oral Q breakfast   ranolazine  1,000 mg Oral BID   rosuvastatin  20 mg Oral Daily   sertraline  100 mg Oral Daily   sodium chloride flush  3 mL Intravenous Q12H   Continuous Infusions:  sodium chloride     heparin 1,000 Units/hr (09/21/21 0653)     LOS: 1 day    Time spent: 27min    Domenic Polite, MD [facility]   09/21/2021, 11:15 AM

## 2021-09-21 NOTE — Progress Notes (Signed)
°  Transition of Care [facility]) Screening Note   Patient Details  Name: Ailene Royal Date of Birth: 10-Jun-1956   Transition of Care [facility]) CM/SW Contact:    Bary Castilla, LCSW Phone Number: 09/21/2021, 1:34 PM    Transition of Care Department [facility]) has reviewed patient and no TOC needs have been identified at this time. We will continue to monitor patient advancement through interdisciplinary progression rounds. If new patient transition needs arise, please place a TOC consult.

## 2021-09-22 ENCOUNTER — Encounter (HOSPITAL_COMMUNITY): Payer: Self-pay | Admitting: Family Medicine

## 2021-09-22 ENCOUNTER — Ambulatory Visit: Payer: Medicare Other | Admitting: Internal Medicine

## 2021-09-22 DIAGNOSIS — I214 Non-ST elevation (NSTEMI) myocardial infarction: Secondary | ICD-10-CM | POA: Diagnosis not present

## 2021-09-22 DIAGNOSIS — B9620 Unspecified Escherichia coli [E. coli] as the cause of diseases classified elsewhere: Secondary | ICD-10-CM | POA: Diagnosis not present

## 2021-09-22 DIAGNOSIS — I5023 Acute on chronic systolic (congestive) heart failure: Secondary | ICD-10-CM | POA: Diagnosis not present

## 2021-09-22 DIAGNOSIS — Z66 Do not resuscitate: Secondary | ICD-10-CM

## 2021-09-22 DIAGNOSIS — I10 Essential (primary) hypertension: Secondary | ICD-10-CM

## 2021-09-22 DIAGNOSIS — N390 Urinary tract infection, site not specified: Secondary | ICD-10-CM | POA: Diagnosis not present

## 2021-09-22 DIAGNOSIS — I4811 Longstanding persistent atrial fibrillation: Secondary | ICD-10-CM

## 2021-09-22 LAB — BASIC METABOLIC PANEL
Anion gap: 11 (ref 5–15)
BUN: 20 mg/dL (ref 8–23)
CO2: 28 mmol/L (ref 22–32)
Calcium: 8.4 mg/dL — ABNORMAL LOW (ref 8.9–10.3)
Chloride: 90 mmol/L — ABNORMAL LOW (ref 98–111)
Creatinine, Ser: 1.1 mg/dL — ABNORMAL HIGH (ref 0.44–1.00)
GFR, Estimated: 56 mL/min — ABNORMAL LOW (ref >60)
Glucose, Bld: 380 mg/dL — ABNORMAL HIGH (ref 70–99)
Potassium: 3.6 mmol/L (ref 3.5–5.1)
Sodium: 129 mmol/L — ABNORMAL LOW (ref 135–145)

## 2021-09-22 LAB — CBC
HCT: 26.3 % — ABNORMAL LOW (ref 36.0–46.0)
Hemoglobin: 8.2 g/dL — ABNORMAL LOW (ref 12.0–15.0)
MCH: 29.2 pg (ref 26.0–34.0)
MCHC: 31.2 g/dL (ref 30.0–36.0)
MCV: 93.6 fL (ref 80.0–100.0)
Platelets: 340 10*3/uL (ref 150–400)
RBC: 2.81 MIL/uL — ABNORMAL LOW (ref 3.87–5.11)
RDW: 17.7 % — ABNORMAL HIGH (ref 11.5–15.5)
WBC: 13.4 10*3/uL — ABNORMAL HIGH (ref 4.0–10.5)
nRBC: 0.1 % (ref 0.0–0.2)

## 2021-09-22 LAB — URINE CULTURE: Culture: >=100000 — AB

## 2021-09-22 LAB — GLUCOSE, CAPILLARY
Glucose-Capillary: 359 mg/dL — ABNORMAL HIGH (ref 70–99)
Glucose-Capillary: 269 mg/dL — ABNORMAL HIGH (ref 70–99)
Glucose-Capillary: 264 mg/dL — ABNORMAL HIGH (ref 70–99)
Glucose-Capillary: 322 mg/dL — ABNORMAL HIGH (ref 70–99)

## 2021-09-22 LAB — HEPARIN LEVEL (UNFRACTIONATED): Heparin Unfractionated: 0.58 IU/mL (ref 0.30–0.70)

## 2021-09-22 LAB — ECHOCARDIOGRAM LIMITED
Height: 61 in
Weight: 3266.34 oz

## 2021-09-22 LAB — MRSA NEXT GEN BY PCR, NASAL: MRSA by PCR Next Gen: NOT DETECTED

## 2021-09-22 LAB — APTT: aPTT: 64 seconds — ABNORMAL HIGH (ref 24–36)

## 2021-09-22 MED ORDER — PREDNISONE 10 MG PO TABS
10.0000 mg | ORAL_TABLET | Freq: Every day | ORAL | Status: DC
  Administered 2021-09-23 – 2021-09-25 (×3): 10 mg via ORAL
  Filled 2021-09-22 (×3): qty 1

## 2021-09-22 MED ORDER — INSULIN ASPART 100 UNIT/ML IJ SOLN
6.0000 [IU] | Freq: Three times a day (TID) | INTRAMUSCULAR | Status: DC
  Administered 2021-09-22 – 2021-09-23 (×3): 6 [IU] via SUBCUTANEOUS

## 2021-09-22 MED ORDER — INSULIN GLARGINE-YFGN 100 UNIT/ML ~~LOC~~ SOLN
25.0000 [IU] | Freq: Every day | SUBCUTANEOUS | Status: DC
  Administered 2021-09-23 – 2021-09-26 (×4): 25 [IU] via SUBCUTANEOUS
  Filled 2021-09-22 (×5): qty 0.25

## 2021-09-22 MED ORDER — APIXABAN 5 MG PO TABS
5.0000 mg | ORAL_TABLET | Freq: Two times a day (BID) | ORAL | Status: DC
  Administered 2021-09-22 – 2021-09-26 (×9): 5 mg via ORAL
  Filled 2021-09-22 (×9): qty 1

## 2021-09-22 MED ORDER — FUROSEMIDE 40 MG PO TABS
40.0000 mg | ORAL_TABLET | Freq: Every day | ORAL | Status: DC
  Administered 2021-09-22 – 2021-09-26 (×5): 40 mg via ORAL
  Filled 2021-09-22 (×5): qty 1

## 2021-09-22 MED ORDER — INSULIN GLARGINE-YFGN 100 UNIT/ML ~~LOC~~ SOLN
10.0000 [IU] | Freq: Once | SUBCUTANEOUS | Status: AC
  Administered 2021-09-22: 10 [IU] via SUBCUTANEOUS
  Filled 2021-09-22: qty 0.1

## 2021-09-22 MED ORDER — SODIUM CHLORIDE 0.9 % IV SOLN
1.0000 g | Freq: Two times a day (BID) | INTRAVENOUS | Status: DC
  Administered 2021-09-22 (×2): 1 g via INTRAVENOUS
  Filled 2021-09-22 (×5): qty 1

## 2021-09-22 MED ORDER — POLYETHYLENE GLYCOL 3350 17 G PO PACK
17.0000 g | PACK | Freq: Every day | ORAL | Status: DC
  Administered 2021-09-22 – 2021-09-26 (×5): 17 g via ORAL
  Filled 2021-09-22 (×5): qty 1

## 2021-09-22 MED ORDER — PHENAZOPYRIDINE HCL 100 MG PO TABS
100.0000 mg | ORAL_TABLET | Freq: Two times a day (BID) | ORAL | Status: AC
  Administered 2021-09-22 (×2): 100 mg via ORAL
  Filled 2021-09-22 (×2): qty 1

## 2021-09-22 NOTE — Progress Notes (Signed)
PROGRESS NOTE    Katherine [name]  ZCH:[id number] DOB: [birth year] DOA: 09/20/2021 PCP: [name], MD  Brief OINOMVEHM:10/O w/ complicated h/o CAD s/p CABG and PCI, bilateral carotid artery stenosis, PAD, PAF on Eliquis, COPD, chronic respiratory failure with hypoxia on home oxygen 3 L per minute, IDDM2, s/p renal transplant on immunosuppressant,  urolithiasis, anemia, history of GI bleed,fibromyalgia/chronic pain, recent hospitalization from 07/25/2021-08/25/2021, for COVID with MRSA pneumonia. 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 -Readmitted with angina, NSTEMI 1/17-1/26, heart cath was reattempted 1/20, this was unsuccessful, she was then seen by palliative care, made DNR and discharged home on Imdur, Ranexa, beta-blocker. -Back in the ED 2/4 with shortness of breath and chest pain, leg swelling -In the ED, O2 needs were higher 6 to 7 L, BNP 423, troponin 278, chest x-ray noted interstitial edema and small bilateral effusions. -Admitted seen by cardiology, started on IV Lasix and IV heparin  Subjective: -Reports being tired, just wants to lay down, mild intermittent chest discomfort, breathing stable  Assessment & Plan:  Acute on chronic respiratory failure with hypoxia (HCC)- (present on admission) Acute on chronic systolic and diastolic CHF -With complex CAD history -Last echo in December with EF 40-45% -Presented with worsening dyspnea, orthopnea and hypoxia, required nonrebreather mask on admission,  -Diuresed with IV Lasix, switch to p.o. by cardiology today -O2 weaned down to 4 L -Repeat echo pending  NSTEMI (non-ST elevated myocardial infarction) with CAD S/P percutaneous coronary angioplasty - PCI x 5 to SVG-D1- (present on admission) Troponin peaked at 563 -IV heparin discontinued, continue  plavix, metoprolol, Imdur, statin, Ranexa -Follow-up repeat echo -2 recent unsuccessful left heart cath attempts on 1/5 and 1/20 -Seen by palliative care over a week ago, now DNR, discussed limited options with patient again, including consideration of hospice, palliative meeting today  Longstanding persistent atrial fibrillation ([place])- (present on admission) - In sinus rhythm, continue metoprolol -Restart Eliquis, heparin discontinued  Type 2 diabetes mellitus with peripheral neuropathy ([place])- (present on admission) a1c of 7.6 in 07/2021 Hold metformin and amaryl, continue long acting insulin -CBGs still poorly controlled, will increase glargine dose and meal coverage further  COPD (chronic obstructive pulmonary disease) ([place])- (present on admission) Chronic respiratory failure on 3 L home O2 -No wheezing at this time, will taper prednisone, continue duo nebs -she is on chronic prednisone 5mg  for her renal transplant   PAD (peripheral artery disease) ([place])- (present on admission) History of occluded bilateral common iliac arteries and common femoral arteries. Patient is high risk for open surgery. Continue with Plavix and Crestor.  seen by vascular last hospitalization  E-coli UTI Developed dysuria, abnormal UA, urine culture with E. coli resistant to ceftriaxone -ABX changed to cefepime today, consider quinolone at discharge  questionable pneumonia on CXR Clinically do not suspect pneumonia, monitor off antibiotics  History of renal transplant -Continue immunosuppressants: imuran  On chronic prednisone, 5mg  daily   Anxiety and depression- (present on admission) Continue zoloft and ativan   Deficiency anemia- (present on admission) Hemoglobin stable, close to baseline  Essential hypertension- (present on admission) Continue Lopressor, Lasix  Dyslipidemia, goal LDL below 70- (present on admission) Continue crestor   Hypokalemia and hypomagnesia - (present on  admission) Repleted   DVT prophylaxis: restart eliquis Code Status: DNR Family Communication: Discussed with patient detail, no family at bedside  Disposition Plan:   Consultants:  Cardiology  Procedures:   Antimicrobials:    Objective: Vitals:   09/22/21 0043 09/22/21 0517 09/22/21 0735 09/22/21 0737  BP: 124/76 118/69    Pulse: 85 78    Resp: 20 15    Temp: 98.8 F (37.1 C) 97.7 F (36.5 C)    TempSrc: Oral Oral    SpO2: 94% 100% 97% 98%  Weight:  92.9 kg    Height:        Intake/Output Summary (Last 24 hours) at 09/22/2021 1101 Last data filed at 09/22/2021 0959 Gross per 24 hour  Intake 1804.65 ml  Output 2300 ml  Net -495.35 ml   Filed Weights   09/20/21 1052 09/21/21 0448 09/22/21 0517  Weight: 86.2 kg 92.6 kg 92.9 kg    Examination:  General exam: Chronically ill pleasant female appears older than stated age, AOx3, no distress HEENT: No JVD CVS: S1-S2, regular rate and rhythm Lungs: Poor air movement bilaterally, few basilar rales Abdomen: Obese, soft, nontender, bowel sounds present Extremities: No edema Skin: No rashes Psychiatry: Tearful    Data Reviewed:   CBC: Recent Labs  Lab 09/20/21 1102 09/21/21 0638 09/22/21 0605  WBC 14.8* 8.1 13.4*  NEUTROABS 12.2*  --   --   HGB 9.2* 8.4* 8.2*  HCT 29.4* 26.9* 26.3*  MCV 93.0 92.1 93.6  PLT 409* 363 641   Basic Metabolic Panel: Recent Labs  Lab 09/20/21 1102 09/21/21 0638 09/22/21 0605  NA 135 133* 129*  K 3.2* 4.3 3.6  CL 96* 95* 90*  CO2 25 28 28   GLUCOSE 216* 321* 380*  BUN 16 16 20   CREATININE 0.89 0.89 1.10*  CALCIUM 9.1 8.9 8.4*  MG 1.3* 1.7  --    GFR: Estimated Creatinine Clearance: 53.7 mL/min (A) (by C-G formula based on SCr of 1.1 mg/dL (H)). Liver Function Tests: No results for input(s): AST, ALT, ALKPHOS, BILITOT, PROT, ALBUMIN in the last 168 hours. No results for input(s): LIPASE, AMYLASE in the last 168 hours. No results for input(s): AMMONIA in the last 168  hours. Coagulation Profile: No results for input(s): INR, PROTIME in the last 168 hours. Cardiac Enzymes: No results for input(s): CKTOTAL, CKMB, CKMBINDEX, TROPONINI in the last 168 hours. BNP (last 3 results) No results for input(s): PROBNP in the last 8760 hours. HbA1C: No results for input(s): HGBA1C in the last 72 hours. CBG: Recent Labs  Lab 09/21/21 0719 09/21/21 1145 09/21/21 1740 09/21/21 2137 09/22/21 0727  GLUCAP 331* 379* 317* 306* 359*   Lipid Profile: No results for input(s): CHOL, HDL, LDLCALC, TRIG, CHOLHDL, LDLDIRECT in the last 72 hours. Thyroid Function Tests: No results for input(s): TSH, T4TOTAL, FREET4, T3FREE, THYROIDAB in the last 72 hours. Anemia Panel: No results for input(s): VITAMINB12, FOLATE, FERRITIN, TIBC, IRON, RETICCTPCT in the last 72 hours. Urine analysis:    Component Value Date/Time   COLORURINE YELLOW 09/20/2021 1429   APPEARANCEUR CLEAR 09/20/2021 1429   LABSPEC 1.015 09/20/2021 1429   PHURINE 6.0 09/20/2021 1429   GLUCOSEU NEGATIVE 09/20/2021 1429   GLUCOSEU 100 (A) 08/13/2020 1550   HGBUR NEGATIVE 09/20/2021 1429   BILIRUBINUR NEGATIVE 09/20/2021 1429   KETONESUR NEGATIVE 09/20/2021 1429   PROTEINUR NEGATIVE 09/20/2021 1429   UROBILINOGEN 0.2 08/13/2020 1550   NITRITE POSITIVE (A) 09/20/2021 1429   LEUKOCYTESUR MODERATE (A) 09/20/2021 1429   Sepsis Labs: @LABRCNTIP (procalcitonin:4,lacticidven:4)  ) Recent Results (from the past 240 hour(s))  Urine Culture     Status: Abnormal  Collection Time: 09/20/21  2:29 PM   Specimen: Urine, Clean Catch  Result Value Ref Range Status   Specimen Description URINE, CLEAN CATCH  Final   Special Requests   Final    NONE Performed at [facility], [place] [address], [place], [place] [address]    Culture >=100,000 COLONIES/mL ESCHERICHIA COLI (A)  Final   Report Status 09/22/2021 FINAL  Final   Organism ID, Bacteria ESCHERICHIA COLI (A)  Final      Susceptibility   Escherichia  coli - MIC*    AMPICILLIN >=32 RESISTANT Resistant     CEFAZOLIN >=64 RESISTANT Resistant     CEFEPIME <=0.12 SENSITIVE Sensitive     CEFTRIAXONE 32 RESISTANT Resistant     CIPROFLOXACIN <=0.25 SENSITIVE Sensitive     GENTAMICIN <=1 SENSITIVE Sensitive     IMIPENEM <=0.25 SENSITIVE Sensitive     NITROFURANTOIN <=16 SENSITIVE Sensitive     TRIMETH/SULFA <=20 SENSITIVE Sensitive     AMPICILLIN/SULBACTAM >=32 RESISTANT Resistant     PIP/TAZO 8 SENSITIVE Sensitive     * >=100,000 COLONIES/mL ESCHERICHIA COLI  Culture, blood (routine x 2)     Status: None (Preliminary result)   Collection Time: 09/20/21  6:08 PM   Specimen: BLOOD LEFT HAND  Result Value Ref Range Status   Specimen Description BLOOD LEFT HAND  Final   Special Requests IN PEDIATRIC BOTTLE Blood Culture adequate volume  Final   Culture   Final    NO GROWTH 2 DAYS Performed at [facility], [place] [address] [place], [place] [address]    Report Status PENDING  Incomplete  Culture, blood (routine x 2)     Status: None (Preliminary result)   Collection Time: 09/20/21  6:08 PM   Specimen: BLOOD LEFT HAND  Result Value Ref Range Status   Specimen Description BLOOD LEFT HAND  Final   Special Requests IN PEDIATRIC BOTTLE Blood Culture adequate volume  Final   Culture   Final    NO GROWTH 2 DAYS Performed at [facility], [place] [address], [place], [place] [address]    Report Status PENDING  Incomplete  Respiratory (~20 pathogens) panel by PCR     Status: None   Collection Time: 09/20/21  7:02 PM   Specimen: Nasopharyngeal Swab; Respiratory  Result Value Ref Range Status   Adenovirus NOT DETECTED NOT DETECTED Final   Coronavirus 229E NOT DETECTED NOT DETECTED Final    Comment: (NOTE) The Coronavirus on the Respiratory Panel, DOES NOT test for the novel  Coronavirus (2019 nCoV)    Coronavirus HKU1 NOT DETECTED NOT DETECTED Final   Coronavirus NL63 NOT DETECTED NOT DETECTED Final   Coronavirus OC43 NOT DETECTED  NOT DETECTED Final   Metapneumovirus NOT DETECTED NOT DETECTED Final   Rhinovirus / Enterovirus NOT DETECTED NOT DETECTED Final   Influenza A NOT DETECTED NOT DETECTED Final   Influenza B NOT DETECTED NOT DETECTED Final   Parainfluenza Virus 1 NOT DETECTED NOT DETECTED Final   Parainfluenza Virus 2 NOT DETECTED NOT DETECTED Final   Parainfluenza Virus 3 NOT DETECTED NOT DETECTED Final   Parainfluenza Virus 4 NOT DETECTED NOT DETECTED Final   Respiratory Syncytial Virus NOT DETECTED NOT DETECTED Final   Bordetella pertussis NOT DETECTED NOT DETECTED Final   Bordetella Parapertussis NOT DETECTED NOT DETECTED Final   Chlamydophila pneumoniae NOT DETECTED NOT DETECTED Final   Mycoplasma pneumoniae NOT DETECTED NOT DETECTED Final    Comment: Performed at [facility], [address], [place], [place] [address]     Radiology Studies: DG Chest 2 View  Result Date: 09/20/2021 CLINICAL DATA:  Difficulty breathing EXAM: CHEST - 2 VIEW COMPARISON:  Previous studies including the examination of 09/07/2021 FINDINGS: Transverse diameter of heart is increased. There is evidence of previous coronary bypass surgery. Central pulmonary vessels are prominent. There is poor inspiration. Increased interstitial markings are seen in both lungs. There is minimal blunting of lateral CP angles. There is no pneumothorax. IMPRESSION: Cardiomegaly. Diffuse increase in interstitial markings in both lungs may suggest pulmonary edema or interstitial pneumonia. Part of this finding may suggest underlying scarring. Possible small bilateral pleural effusions. Electronically Signed   By: Elmer Picker M.D.   On: 09/20/2021 11:48     Scheduled Meds:  apixaban  5 mg Oral BID   arformoterol  15 mcg Nebulization BID   azaTHIOprine  125 mg Oral Q1500   budesonide  2 mL Nebulization BID   clopidogrel  75 mg Oral Daily   furosemide  40 mg Oral Daily   gabapentin  100 mg Oral QHS   insulin aspart  0-15 Units  Subcutaneous TID WC   insulin aspart  4 Units Subcutaneous TID WC   insulin glargine-yfgn  20 Units Subcutaneous QAC breakfast   isosorbide mononitrate  60 mg Oral Daily   lamoTRIgine  25 mg Oral QHS   metoprolol tartrate  25 mg Oral TID   montelukast  10 mg Oral QHS   pantoprazole  40 mg Oral Daily   phenazopyridine  100 mg Oral BID   polyethylene glycol  17 g Oral Daily   predniSONE  20 mg Oral Q breakfast   ranolazine  1,000 mg Oral BID   rosuvastatin  20 mg Oral Daily   sertraline  100 mg Oral Daily   sodium chloride flush  3 mL Intravenous Q12H   Continuous Infusions:  sodium chloride 250 mL (09/21/21 1945)   ceFEPime (MAXIPIME) IV       LOS: 2 days    Time spent: 55min  Domenic Polite, MD [facility]   09/22/2021, 11:01 AM

## 2021-09-22 NOTE — Consult Note (Addendum)
Consultation Note Date: 09/22/2021   Patient Name: Katherine [name]  DOB: 10/11/1955  MRN: [id number]  Age / Sex: 66 y.o., female  PCP: [name], MD Referring Physician: Domenic Polite, MD  Reason for Consultation: Establishing goals of care  HPI/Patient Profile: 66 y.o. female  with past medical history of CAD status post CABG and PCI, ESRD status post transplant and on immunosuppressant, 2 failed attempts at cardiac catheterization due to no vascular access, PAD, PAF (Eliquis), COPD, chronic respiratory failure with hypoxia (3 L baseline supplemental oxygen use at home), IDDM T, GERD, urolithiasis, anxiety, depression, fibromyalgia, anemia, and history of GI bleed admitted on 09/20/2021 with SOB, CP, and leg swelling.  Of note, hospitalization on 12/9-1/9 for Covid with MRSA PNA. 1/17-1/26 for NSTEMI and angina.    Clinical Assessment and Goals of Care: I have reviewed medical records including EPIC notes, labs and imaging, assessed the patient and then met with patient and her niece [name] to discuss diagnosis prognosis, [place], EOL wishes, disposition and options.  I introduced Palliative Medicine as specialized medical care for people living with serious illness. It focuses on providing relief from the symptoms and stress of a serious illness. The goal is to improve quality of life for both the patient and the family.  As far as functional and nutritional status [name] shares that patient spends most of her time at home and in bed. [name] states that patient frequently cancels doctor's appts since she is physically unable or is in too much pain to go. Patient nods her head in agreement.  We discussed patient's current illness and what it means in the larger context of patient's on-going co-morbidities. I attempted to elicit values and goals of care important to the patient.  Patient shared that spending  time with her dog, seeing her family, and being able to get up and go where and when she wants is important to her.    She also detailed she did not ever want to be put in a coma.  She recalls when she was on a ventilator after her CABG and said it was awful to be awake but not be able to communicate with her loved ones.  Patient shares her concerns that if she decides to move forward with hospice then she will be sedated and likely confined to a bed.  Myths and misunderstandings of hospice in the home discussed with patient.  Clarified patient's death is not expected within the next 2 weeks and therefore she is not a candidate for end-of-life or hospice inpatient care at this time.  Reviewed that the goal of hospice is to boost the patient's quality of life, treat her symptoms, and keep her as functional and comfortable as possible for as long as she has.  The difference between aggressive medical intervention and comfort care was considered in light of the patient's goals of care.  Patient has a clear understanding that her heart is weak and is not going to improve.  Palliative outpatient services and hospice services  in the home discussed in detail with patient and niece.  Discussed with patient/family the importance of continued conversation with family and the medical providers regarding overall plan of care and treatment options, ensuring decisions are within the context of the patients values and GOCs.    Patient's niece is recommending the patient move forward with hospice in the home to better manage the patient's pain and offer a more broad sweeping amount of support.  However, patient is undecided on how she would like to move forward with her care at this time.  Therapeutic sounds and active listening provided for patient to share her thoughts and emotions regarding her current health status.  Patient asked if her condition was terminal.  I responded with yes and that while we do not know how  long she has we know that her heart is weak.  She shared she would like to think over her options.  Palliative medicine team will continue to follow the patient throughout her hospitalization.  I plan to revisit with the patient tomorrow to further discuss next steps.  Questions and concerns were addressed. The family was encouraged to call with questions or concerns.   Primary Decision Maker PATIENT  Code Status/Advance Care Planning: DNR  Prognosis:   Unable to determine  Discharge Planning: To Be Determined  Primary Diagnoses: Present on Admission:  Hypokalemia and hypomagnesia   COPD (chronic obstructive pulmonary disease) (HCC)  Dyslipidemia, goal LDL below 70  Essential hypertension  Anxiety and depression  Longstanding persistent atrial fibrillation (HCC)  Type 2 diabetes mellitus with peripheral neuropathy (HCC)  Deficiency anemia  Acute on chronic systolic CHF (congestive heart failure) (HCC)  PAD (peripheral artery disease) (HCC)  NSTEMI (non-ST elevated myocardial infarction) with CAD S/P percutaneous coronary angioplasty - PCI x 5 to SVG-D1  Acute on chronic respiratory failure with hypoxia (HCC)  Paroxysmal atrial fibrillation ([place]); CHA2DS2VASc score F, HTN, CAD, CVA = 5   Physical Exam Constitutional:      General: She is not in acute distress.    Appearance: She is obese. She is not toxic-appearing.  HENT:     Head: Normocephalic.  Cardiovascular:     Rate and Rhythm: Normal rate. Rhythm irregular.  Pulmonary:     Effort: Pulmonary effort is normal.     Comments: [place] in place Musculoskeletal:        General: Normal range of motion.  Skin:    General: Skin is warm and dry.  Neurological:     Mental Status: She is alert and oriented to person, place, and time.  Psychiatric:        Mood and Affect: Mood normal. Mood is not anxious.        Behavior: Behavior normal. Behavior is not agitated.    Palliative Assessment/Data: 60%     I discussed this  patient's plan of care with patient, patient's niece [name].  Thank you for this consult. Palliative medicine will continue to follow and assist holistically.   Time Total: 110 minutes Greater than 50%  of this time was spent counseling and coordinating care related to the above assessment and plan.  Signed by: Jordan Hawks, DNP, FNP-BC Palliative Medicine    Please contact Palliative Medicine Team phone at [phone] for questions and concerns.  For individual provider: See Shea Evans

## 2021-09-22 NOTE — Assessment & Plan Note (Addendum)
Patient received 3 days of antibiotic therapy with good toleration.

## 2021-09-22 NOTE — Progress Notes (Signed)
Progress Note  Patient Name: Katherine [name] Date of Encounter: 09/22/2021  [facility] Cardiologist: [name], MD   Subjective   No CP or dyspnea  Inpatient Medications    Scheduled Meds:  arformoterol  15 mcg Nebulization BID   azaTHIOprine  125 mg Oral Q1500   budesonide  2 mL Nebulization BID   clopidogrel  75 mg Oral Daily   furosemide  60 mg Intravenous Q12H   gabapentin  100 mg Oral QHS   insulin aspart  0-15 Units Subcutaneous TID WC   insulin aspart  4 Units Subcutaneous TID WC   insulin glargine-yfgn  20 Units Subcutaneous QAC breakfast   isosorbide mononitrate  60 mg Oral Daily   lamoTRIgine  25 mg Oral QHS   metoprolol tartrate  25 mg Oral TID   montelukast  10 mg Oral QHS   pantoprazole  40 mg Oral Daily   predniSONE  20 mg Oral Q breakfast   ranolazine  1,000 mg Oral BID   rosuvastatin  20 mg Oral Daily   sertraline  100 mg Oral Daily   sodium chloride flush  3 mL Intravenous Q12H   Continuous Infusions:  sodium chloride 250 mL (09/21/21 1945)   cefTRIAXone (ROCEPHIN)  IV 1 g (09/21/21 1559)   heparin 1,050 Units/hr (09/21/21 1944)   PRN Meds: sodium chloride, acetaminophen **OR** acetaminophen, LORazepam, menthol-cetylpyridinium, nitroGLYCERIN, ondansetron **OR** ondansetron (ZOFRAN) IV, oxyCODONE, sodium chloride, sodium chloride flush   Vital Signs    Vitals:   09/22/21 0043 09/22/21 0517 09/22/21 0735 09/22/21 0737  BP: 124/76 118/69    Pulse: 85 78    Resp: 20 15    Temp: 98.8 F (37.1 C) 97.7 F (36.5 C)    TempSrc: Oral Oral    SpO2: 94% 100% 97% 98%  Weight:  92.9 kg    Height:        Intake/Output Summary (Last 24 hours) at 09/22/2021 0821 Last data filed at 09/22/2021 0521 Gross per 24 hour  Intake 1766.65 ml  Output 2300 ml  Net -533.35 ml   Last 3 Weights 09/22/2021 09/21/2021 09/20/2021  Weight (lbs) 204 lb 12.9 oz 204 lb 2.3 oz 190 lb  Weight (kg) 92.9 kg 92.6 kg 86.183 kg      Telemetry    Sinus - Personally  Reviewed   Physical Exam   GEN: No acute distress.   Neck: No JVD Cardiac: RRR, no murmurs, rubs, or gallops.  Respiratory: Clear to auscultation bilaterally. GI: Soft, nontender, non-distended  MS: No edema Neuro:  Nonfocal  Psych: Normal affect   Labs    High Sensitivity Troponin:   Recent Labs  Lab 09/20/21 1302 09/20/21 1827 09/20/21 2045 09/21/21 0752 09/21/21 0947  TROPONINIHS 366* 457* 563* 461* 400*     Chemistry Recent Labs  Lab 09/20/21 1102 09/21/21 0638 09/22/21 0605  NA 135 133* 129*  K 3.2* 4.3 3.6  CL 96* 95* 90*  CO2 25 28 28   GLUCOSE 216* 321* 380*  BUN 16 16 20   CREATININE 0.89 0.89 1.10*  CALCIUM 9.1 8.9 8.4*  MG 1.3* 1.7  --   GFRNONAA >60 >60 56*  ANIONGAP 14 10 11     Hematology Recent Labs  Lab 09/20/21 1102 09/21/21 0638 09/22/21 0605  WBC 14.8* 8.1 13.4*  RBC 3.16* 2.92* 2.81*  HGB 9.2* 8.4* 8.2*  HCT 29.4* 26.9* 26.3*  MCV 93.0 92.1 93.6  MCH 29.1 28.8 29.2  MCHC 31.3 31.2 31.2  RDW 17.5* 17.6*  17.7*  PLT 409* 363 340    BNP Recent Labs  Lab 09/20/21 1126  BNP 423.8*     Radiology    DG Chest 2 View  Result Date: 09/20/2021 CLINICAL DATA:  Difficulty breathing EXAM: CHEST - 2 VIEW COMPARISON:  Previous studies including the examination of 09/07/2021 FINDINGS: Transverse diameter of heart is increased. There is evidence of previous coronary bypass surgery. Central pulmonary vessels are prominent. There is poor inspiration. Increased interstitial markings are seen in both lungs. There is minimal blunting of lateral CP angles. There is no pneumothorax. IMPRESSION: Cardiomegaly. Diffuse increase in interstitial markings in both lungs may suggest pulmonary edema or interstitial pneumonia. Part of this finding may suggest underlying scarring. Possible small bilateral pleural effusions. Electronically Signed   By: Elmer Picker M.D.   On: 09/20/2021 11:48    Patient Profile     66 y.o. female with past medical history  of coronary artery disease status post coronary artery bypass graft, end-stage renal disease status post transplant, history of 2 recent attempts at cardiac catheterization which were unsuccessful due to inability to get vascular access being evaluated for unstable angina/non-ST elevation myocardial infarction.  Assessment & Plan    1 unstable angina/non-ST elevation myocardial infarction-patient remains pain-free this morning.  As outlined in previous notes patient has had 2 recent attempts at cardiac catheterization both of which were unsuccessful due to inability to gain vascular access.  Plan medical therapy only.  Continue Plavix, statin, beta-blocker and nitrates.  Discontinue IV heparin.  Probable discharge tomorrow if patient remains stable.  Agree with palliative care evaluation.  2 acute on chronic combined systolic/diastolic congestive heart failure-volume status has improved.  Will change Lasix to 40 mg by mouth daily.  Follow renal function.  Follow-up echocardiogram results are pending.  3 history of renal transplant-follow renal function closely with diuresis.  4 paroxysmal atrial fibrillation-patient is in sinus rhythm.  We will resume apixaban.  Continue metoprolol.  For questions or updates, please contact [place] Please consult [email or web address] for contact info under        Signed, Kirk Ruths, MD  09/22/2021, 8:21 AM

## 2021-09-23 ENCOUNTER — Inpatient Hospital Stay (HOSPITAL_COMMUNITY): Payer: Medicare Other

## 2021-09-23 ENCOUNTER — Other Ambulatory Visit: Payer: Self-pay | Admitting: Emergency Medicine

## 2021-09-23 DIAGNOSIS — I214 Non-ST elevation (NSTEMI) myocardial infarction: Secondary | ICD-10-CM | POA: Diagnosis not present

## 2021-09-23 DIAGNOSIS — I5023 Acute on chronic systolic (congestive) heart failure: Secondary | ICD-10-CM | POA: Diagnosis not present

## 2021-09-23 LAB — GLUCOSE, CAPILLARY
Glucose-Capillary: 239 mg/dL — ABNORMAL HIGH (ref 70–99)
Glucose-Capillary: 418 mg/dL — ABNORMAL HIGH (ref 70–99)
Glucose-Capillary: 408 mg/dL — ABNORMAL HIGH (ref 70–99)
Glucose-Capillary: 276 mg/dL — ABNORMAL HIGH (ref 70–99)

## 2021-09-23 LAB — BASIC METABOLIC PANEL
Anion gap: 11 (ref 5–15)
BUN: 21 mg/dL (ref 8–23)
CO2: 27 mmol/L (ref 22–32)
Calcium: 8.4 mg/dL — ABNORMAL LOW (ref 8.9–10.3)
Chloride: 92 mmol/L — ABNORMAL LOW (ref 98–111)
Creatinine, Ser: 0.91 mg/dL (ref 0.44–1.00)
GFR, Estimated: >60 mL/min (ref >60)
Glucose, Bld: 376 mg/dL — ABNORMAL HIGH (ref 70–99)
Potassium: 4 mmol/L (ref 3.5–5.1)
Sodium: 130 mmol/L — ABNORMAL LOW (ref 135–145)

## 2021-09-23 MED ORDER — INSULIN ASPART 100 UNIT/ML IJ SOLN
8.0000 [IU] | Freq: Three times a day (TID) | INTRAMUSCULAR | Status: DC
  Administered 2021-09-23 – 2021-09-26 (×10): 8 [IU] via SUBCUTANEOUS

## 2021-09-23 MED ORDER — INSULIN GLARGINE-YFGN 100 UNIT/ML ~~LOC~~ SOLN
10.0000 [IU] | Freq: Once | SUBCUTANEOUS | Status: AC
  Administered 2021-09-23: 10 [IU] via SUBCUTANEOUS
  Filled 2021-09-23: qty 0.1

## 2021-09-23 MED ORDER — IPRATROPIUM-ALBUTEROL 0.5-2.5 (3) MG/3ML IN SOLN
3.0000 mL | Freq: Four times a day (QID) | RESPIRATORY_TRACT | Status: DC
  Administered 2021-09-23 – 2021-09-24 (×2): 3 mL via RESPIRATORY_TRACT
  Filled 2021-09-23 (×3): qty 3

## 2021-09-23 MED ORDER — SODIUM CHLORIDE 0.9 % IV SOLN
2.0000 g | Freq: Two times a day (BID) | INTRAVENOUS | Status: DC
  Administered 2021-09-23 – 2021-09-24 (×4): 2 g via INTRAVENOUS
  Filled 2021-09-23 (×5): qty 2

## 2021-09-23 MED ORDER — FUROSEMIDE 10 MG/ML IJ SOLN
40.0000 mg | Freq: Once | INTRAMUSCULAR | Status: AC
Start: 2021-09-23 — End: 2021-09-23
  Administered 2021-09-23: 40 mg via INTRAVENOUS
  Filled 2021-09-23: qty 4

## 2021-09-23 NOTE — Progress Notes (Addendum)
Manufacturing engineer [facility]) Hospital Liaison: RN note     Notified by Transition of Care Manger of patient/family request for [facility] services at home after discharge. Chart and patient information under review by [facility] physician. Hospice eligibility pending currently.     Writer spoke with patient to initiate education related to hospice philosophy, services and team approach to care. Patient verbalized understanding of information given. Please send signed and completed DNR form home with patient/family. Patient will need prescriptions for discharge comfort medications.      DME needs have been discussed, patient currently has the following equipment in the home: oxygen, walker, 3N1.  Patient/family requests the following DME for delivery to the home: hospital bed considered but family requests to wait until patient is home to order this.     [facility] aware of the above. Please notify [facility] when patient is ready to leave the unit at discharge. (Call [phone] or [phone] after 5pm.) [facility] information and contact numbers given to patient.       A Please do not hesitate to call with questions.     Thank you,     Farrel Gordon, RN, [place] Hospital Liaison     [phone]

## 2021-09-23 NOTE — Progress Notes (Signed)
PHARMACY NOTE:  ANTIMICROBIAL RENAL DOSAGE ADJUSTMENT  Current antimicrobial regimen includes a mismatch between antimicrobial dosage and estimated renal function.  As per policy approved by the Pharmacy & Therapeutics and Medical Executive Committees, the antimicrobial dosage will be adjusted accordingly.  Current antimicrobial dosage:  cefepime 1g IV q12h  Indication: UTI  Renal Function:  Estimated Creatinine Clearance: 65.6 mL/min (by C-G formula based on SCr of 0.91 mg/dL). []      On intermittent HD, scheduled: []      On CRRT    Antimicrobial dosage has been changed to:  2g q12h  Additional comments:   Thank you for allowing pharmacy to be a part of this patient's care.  Einar Grad, [facility] 09/23/2021 7:45 AM

## 2021-09-23 NOTE — TOC Initial Note (Signed)
Transition of Care [facility]) - Initial/Assessment Note    Patient Details  Name: Katherine [name] MRN: [id number] Date of Birth: 06-19-1956  Transition of Care [facility]) CM/SW Contact:    Bethena Roys, RN Phone Number: 09/23/2021, 4:41 PM  Clinical Narrative:  Risk for readmission assessment completed. PTA patient was from home with family support. Case Manager received a secure chat earlier from the Palliative NP to offer choice for Hospice. Case Manager offered choice and the patient chose [facility] for services. Referral was made with Joselyn Glassman and the patient is in need of a hospital bed for home. Patient has oxygen via [employer] along with rolling walker and bedside commode. Palliative NP and [facility] met with patient to answer additional questions. Case Manager will continue to follow for additional disposition needs.            Expected Discharge Plan: Home w Hospice Care Barriers to Discharge: Continued Medical Work up   Patient Goals and CMS Choice Patient states their goals for this hospitalization and ongoing recovery are:: Patient is agreeable to Hospice Services in the home. CMS Medicare.gov Compare Post Acute Care list provided to:: Patient Choice offered to / list presented to : Patient  Expected Discharge Plan and Services Expected Discharge Plan: [place] In-house Referral: Hospice / Palliative Care Discharge Planning Services: CM Consult Post Acute Care Choice: Hospice Living arrangements for the past 2 months: Single Family Home                         HH Arranged: RN, Disease Management [place] Agency: [facility] [place] Date [facility] Agency Contacted: 09/23/21 Time HH Agency Contacted: 1200 Representative spoke with at [place]: Bevely Palmer  Prior Living Arrangements/Services Living arrangements for the past 2 months: [place] with:: Relatives Patient language  and need for interpreter reviewed:: Yes Do you feel safe going back to the place where you live?: Yes      Need for Family Participation in Patient Care: Yes (Comment) Care giver support system in place?: Yes (comment) Current home services: DME Criminal Activity/Legal Involvement Pertinent to Current Situation/Hospitalization: No - Comment as needed  Activities of Daily Living Home Assistive Devices/Equipment: Oxygen ADL Screening (condition at time of admission) Patient's cognitive ability adequate to safely complete daily activities?: Yes Is the patient deaf or have difficulty hearing?: No Does the patient have difficulty seeing, even when wearing glasses/contacts?: No Does the patient have difficulty concentrating, remembering, or making decisions?: No Patient able to express need for assistance with ADLs?: Yes Does the patient have difficulty dressing or bathing?: No Independently performs ADLs?: Yes (appropriate for developmental age) Does the patient have difficulty walking or climbing stairs?: Yes Weakness of Legs: Both Weakness of Arms/Hands: Both  Permission Sought/Granted Permission sought to share information with : Family Supports, Case Freight forwarder, Chartered certified accountant granted to share information with : Yes, Verbal Permission Granted     Permission granted to share info w AGENCY: Cabin crew        Emotional Assessment Appearance:: Appears stated age Attitude/Demeanor/Rapport: Engaged Affect (typically observed): Appropriate Orientation: : Oriented to Situation, Oriented to  Time, Oriented to Place, Oriented to Self Alcohol / Substance Use: Not Applicable Psych Involvement: No (comment)  Admission diagnosis:  Hypoxia [R09.02] Acute on chronic respiratory failure with hypoxia (HCC) [J96.21] Acute on chronic congestive heart failure, unspecified heart failure type ([place]) [  I50.9] Patient Active Problem List   Diagnosis Date Noted    E-coli UTI 09/22/2021   questionable pneumonia on CXR 09/20/2021   Dysuria 09/20/2021   Rest pain of lower extremity due to atherosclerosis ([place]) 09/02/2021   Longstanding persistent atrial fibrillation ([place])    MRSA pneumonia ([place]) 08/09/2021   Chronic diastolic CHF (congestive heart failure) ([place]) 08/06/2021   Obesity (BMI 30-39.9) 08/05/2021   Acute on chronic respiratory failure with hypoxia ([place]) 08/04/2021   Pressure injury of skin 08/04/2021   Polymorphic ventricular tachycardia    Bradycardia    COPD (chronic obstructive pulmonary disease) ([place])    History of renal transplant    Nausea & vomiting 04/19/2021   Coronary stent restenosis due to scar tissue    Acute on chronic systolic CHF (congestive heart failure) ([place]) 01/29/2021   Non-ST elevation (NSTEMI) myocardial infarction [facility])    Chronic respiratory failure with hypoxia ([place]) 01/01/2021   Need for pneumococcal vaccination 08/13/2020   Candidal skin infection 08/13/2020   Anxiety and depression 08/13/2020   Chronic bronchitis, mucopurulent ([place]) 08/24/2019   Current chronic use of systemic steroids 07/23/2019   OSA (obstructive sleep apnea)    Severe episode of recurrent major depressive disorder, with psychotic features ([place]) 02/16/2019   Obstructive chronic bronchitis without exacerbation ([place]) 11/03/2018   Long term (current) use of antithrombotics/antiplatelets 04/28/2018   Squamous cell carcinoma of skin of right thumb 02/02/2018   LBBB (left bundle branch block) 10/07/2017   Primary osteoarthritis of right knee 08/18/2017   Ovarian cyst 05/18/2017   Pelvic mass in female 03/11/2017   Mild aortic stenosis by prior echocardiogram 01/28/2017   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   Deficiency anemia 10/14/2016   Normocytic anemia 10/13/2016   Fibromyalgia 03/30/2016   Other spondylosis with radiculopathy, lumbar region 03/30/2016   Type 2 diabetes mellitus with peripheral neuropathy ([place]) 03/30/2016   Paroxysmal atrial  fibrillation ([place]); CHA2DS2VASc score F, HTN, CAD, CVA = 5 06/28/2014   Debility 06/28/2014   Fluttering heart 06/21/2014   CAD S/P percutaneous coronary angioplasty - PCI x 5 to SVG-D1 09/11/2013    Class: Diagnosis of   Essential hypertension    NSTEMI (non-ST elevated myocardial infarction) with CAD S/P percutaneous coronary angioplasty - PCI x 5 to SVG-D1 08/23/2013   PAD (peripheral artery disease) ([place]) 08/17/2013   CAD (coronary artery disease) of bypass graft 10/08/2012   Stenosis of right carotid artery without infarction 10/08/2012   Dyslipidemia, goal LDL below 70 10/08/2012    Class: Diagnosis of   Mitral annular calcification 10/08/2012   Hypomagnesemia 06/13/2012   Hypokalemia and hypomagnesia  06/12/2012   Renal transplant disorder 06/12/2012   Chronic allergic rhinitis 04/29/2011   Extrinsic asthma 09/09/2007   GERD 09/09/2007   COUGH, CHRONIC 09/09/2007   Morbid obesity - s/p Lap Band 9/'05 05/07/2004    Class: Diagnosis of   Hx of CABG 09/07/1993    Class: History of   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 07/1993   Coronary artery disease involving native coronary artery of native heart with angina pectoris ([place]) 07/1993   ESRD (end stage renal disease) ([place]) 1991   PCP:  [name], MD Pharmacy:   [facility] [address] [place] [address] Phone: [phone] Fax: [phone]  [id number] [place], [address] [place] [address] Phone: [phone] Fax: [phone]   Readmission Risk Interventions Readmission Risk Prevention Plan 09/23/2021 09/08/2021  05/14/2021  Transportation Screening Complete Complete Complete  HRI or [place] for [place] - - -  Medication Review Press photographer) Complete Complete Complete  PCP or Specialist appointment  within 3-5 days of discharge Complete Complete Complete  HRI or [place] Complete Complete Complete  SW Recovery Care/Counseling Consult Complete Complete Complete  Palliative Care Screening Complete Not Applicable Not Applicable  [name] Not Applicable Not Applicable Not Applicable  Some recent data might be hidden

## 2021-09-23 NOTE — Progress Notes (Signed)
Palliative Care Progress Note, Assessment & Plan   Patient Name: Katherine [name]       Date: 09/23/2021 DOB: Jun 26, 1956  Age: 66 y.o. MRN#: [id number] Attending Physician: Domenic Polite, MD Primary Care Physician: [name], MD Admit Date: 09/20/2021  Reason for Consultation/Follow-up: Establishing goals of care  Subjective: Patient is sitting in bed in NAD with Bowdle in place. She has no acute complaints. RN Alda Lea is at bedside administering PO medications.   HPI: 66 y.o. female  with past medical history of CAD status post CABG and PCI, ESRD status post transplant and on immunosuppressant, 2 failed attempts at cardiac catheterization due to no vascular access, PAD, PAF (Eliquis), COPD, chronic respiratory failure with hypoxia (3 L baseline supplemental oxygen use at home), IDDM T, GERD, urolithiasis, anxiety, depression, fibromyalgia, anemia, and history of GI bleed admitted on 09/20/2021 with SOB, CP, and leg swelling.   Of note, hospitalization on 12/9-1/9 for Covid with MRSA PNA. 1/17-1/26 for NSTEMI and angina.    Summary of counseling/coordination of care: AFter revieweing the patient's chart and assessing the patient, I engaged the patient in discussion of her overall goals of care. She shares she is tired and it is "like murder" to go to a doctor's appointment to get treatment or medication refills.  Patient shared she wants to do whatever will keep her from suffering and out of pain. She says she does not think the therapy does her any good. It wears her out and she knows it is not going to make her heart stronger. She shared she knows she has a bad heart and that it is only going to get worse. She shares she "guesses" she will move forward with Hospice.  I reminded her of our discussion  yesterday wherein we had reviewed continuing with aggressive medical interventions, continuing with PT/OT at home, and being followed by [facility]. I also outlined that she has the option to enact her Hospice benefits and focus her plan of care on comfort instead of treatment.  I again reviewed that pt has full agency over her medical decisions. I offered to have her speak with Hospice Liaison to further understand her options.   TOC contacted and notified of patient's interest in Hospice. TOC offered choice and pt has chosen [facility]. [place] contacted and a meeting was set for 12pm.  I met with patient, [name], and Hospice RN Orson Slick to discuss hospice vs palliative. Hospice philosophy and specifics of hospice and palliative services outlined for patient. I asked for clarification on pain management and RN and CNA visits to the home. Patient shared she thought Hospice was a good idea for her but wants to speak with her niece [name] first. She plans to speak with [name] when she is out of work this afternoon. Bevely Palmer made aware to not speak with family until patient has spoke with family.   Questions and concerns were addressed.  Code Status: DNR  Prognosis: Unable to determine  Discharge Planning: To Be Determined  Recommendations/Plan: Pt to speak with niece regarding decision to go home with hospice  Care plan was discussed with patient, Hospice Liaison Bevely Palmer, RN  [place]  Physical Exam Vitals and nursing note reviewed.  Constitutional:      Appearance: She is obese.  HENT:     Head: Normocephalic and atraumatic.  Cardiovascular:     Rate and Rhythm: Normal rate. Rhythm irregular.  Pulmonary:     Effort: Pulmonary effort is normal.     Comments: 2L [place] in place Abdominal:     Palpations: Abdomen is soft.  Musculoskeletal:     Comments: Generalized weakness  Skin:    General: Skin is warm.  Neurological:      Mental Status: She is alert and oriented to person, place, and time.  Psychiatric:        Mood and Affect: Mood normal. Mood is not anxious.        Behavior: Behavior normal. Behavior is not agitated.            Palliative Assessment/Data: 40%    Total Time 90 minutes  Greater than 50%  of this time was spent counseling and coordinating care related to the above assessment and plan.  Thank you for allowing the Palliative Medicine Team to assist in the care of this patient.  [place] Ilsa Iha, FNP-BC Palliative Medicine Team Team Phone # [phone]

## 2021-09-23 NOTE — Progress Notes (Signed)
PROGRESS NOTE    Athziry Millican  QIO:[id number] DOB: 04/14/1956 DOA: 09/20/2021 PCP: [name], MD  Brief [id number]:26/D w/ complicated h/o CAD s/p CABG and PCI, bilateral carotid artery stenosis, PAD, PAF on Eliquis, COPD, chronic respiratory failure with hypoxia on home oxygen 3 L per minute, IDDM2, s/p renal transplant on immunosuppressant,  urolithiasis, anemia, history of GI bleed,fibromyalgia/chronic pain, recent hospitalization from 07/25/2021-08/25/2021, for COVID with MRSA pneumonia. 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 -Readmitted with angina, NSTEMI 1/17-1/26, heart cath was reattempted 1/20, this was unsuccessful, she was then seen by palliative care, made DNR and discharged home on Imdur, Ranexa, beta-blocker. -Back in the ED 2/4 with shortness of breath and chest pain, leg swelling -In the ED, O2 needs were higher 6 to 7 L, BNP 423, troponin 278, chest x-ray noted interstitial edema and small bilateral effusions. -Admitted seen by cardiology, started on IV Lasix and IV heparin -Palliative care reconsulted, seen by cardiology -options limited  Subjective: -Upset about the limited cardiac options, understands that her prognosis is poor  Assessment & Plan:  Acute on chronic respiratory failure with hypoxia ([place])- (present on admission) Acute on chronic systolic and diastolic CHF -With complex CAD history -Last echo in December with EF 40-45% -Presented with worsening dyspnea, orthopnea and hypoxia, required nonrebreather mask on admission,  -Diuresed with IV Lasix, clinically appears close to euvolemic, transition to oral diuretics  -O2 weaned down to 4 L, repeat echo with preserved EF and wall motion abnormalities  -Repeat x-ray today, patient reports increased dyspnea and some  wheezing  NSTEMI (non-ST elevated myocardial infarction) with CAD S/P percutaneous coronary angioplasty - PCI x 5 to SVG-D1- (present on admission) Troponin peaked at 563 -IV heparin discontinued yesterday, continue plavix, metoprolol, Imdur, statin, Ranexa -Repeat echo with preserved EF, wall motion abnormalities -2 recent unsuccessful left heart cath attempts on 1/5 and 1/20 -Seen by palliative care over a week ago, now DNR, discussed limited options with patient again, including consideration of hospice, had palliative meeting yesterday, hospice was discussed, patient to meet with palliative care again today  Longstanding persistent atrial fibrillation ([place])- (present on admission) - In sinus rhythm, continue metoprolol -Restarted Eliquis, heparin discontinued  Type 2 diabetes mellitus with peripheral neuropathy ([place])- (present on admission) a1c of 7.6 in 07/2021 Hold metformin and amaryl, continue long acting insulin -CBGs in the 300-400 range now, cut down prednisone, increase Semglee and meal coverage  COPD (chronic obstructive pulmonary disease) ([place])- (present on admission) Chronic respiratory failure on 3 L home O2 -No wheezing at this time, will taper prednisone, continue duo nebs -she is on chronic prednisone 5mg  for her renal transplant   PAD (peripheral artery disease) ([place])- (present on admission) History of occluded bilateral common iliac arteries and common femoral arteries. Patient is high risk for open surgery. Continue with Plavix and Crestor.  seen by vascular last hospitalization  E-coli UTI Developed dysuria, abnormal UA, urine culture with E. coli resistant to ceftriaxone -ABX changed to cefepime yesterday, consider quinolone at discharge  questionable pneumonia on CXR Clinically do not suspect pneumonia, monitor off antibiotics  History of renal transplant -Continue immunosuppressants: imuran  On chronic prednisone, 5mg  daily   Anxiety and depression-  (present on admission) Continue zoloft and ativan   Deficiency anemia- (present on admission) Chronic anemia, hemoglobin stable, close to baseline,  repeat  Essential hypertension- (present on admission) Continue Lopressor, Lasix  Dyslipidemia, goal LDL below 70- (present on admission) Continue crestor   Hypokalemia and hypomagnesia - (present on admission) Repleted   DVT prophylaxis: eliquis Code Status: DNR Family Communication: Discussed with patient detail, no family at bedside Disposition Plan:   Consultants:  Cardiology  Procedures:   Antimicrobials:    Objective: Vitals:   09/23/21 0626 09/23/21 0742 09/23/21 0744 09/23/21 0821  BP:    124/75  Pulse:    78  Resp:    16  Temp:      TempSrc:      SpO2:  92% 97% 93%  Weight: 94.6 kg     Height:        Intake/Output Summary (Last 24 hours) at 09/23/2021 1211 Last data filed at 09/23/2021 1100 Gross per 24 hour  Intake 820 ml  Output 750 ml  Net 70 ml   Filed Weights   09/21/21 0448 09/22/21 0517 09/23/21 0626  Weight: 92.6 kg 92.9 kg 94.6 kg    Examination:  General exam: Chronically ill pleasant female appears older than stated age, AOx3, no distress HEENT: No JVD CVS: S1-S2, regular rate and rhythm Lungs: Poor air movement bilaterally, few basilar rales Abdomen: Obese, soft, nontender, bowel sounds present Extremities: No edema Skin: No rashes Psychiatry: Tearful    Data Reviewed:   CBC: Recent Labs  Lab 09/20/21 1102 09/21/21 0638 09/22/21 0605  WBC 14.8* 8.1 13.4*  NEUTROABS 12.2*  --   --   HGB 9.2* 8.4* 8.2*  HCT 29.4* 26.9* 26.3*  MCV 93.0 92.1 93.6  PLT 409* 363 063   Basic Metabolic Panel: Recent Labs  Lab 09/20/21 1102 09/21/21 0638 09/22/21 0605 09/23/21 0249  NA 135 133* 129* 130*  K 3.2* 4.3 3.6 4.0  CL 96* 95* 90* 92*  CO2 25 28 28 27   GLUCOSE 216* 321* 380* 376*  BUN 16 16 20 21   CREATININE 0.89 0.89 1.10* 0.91  CALCIUM 9.1 8.9 8.4* 8.4*  MG 1.3* 1.7  --    --    GFR: Estimated Creatinine Clearance: 65.6 mL/min (by C-G formula based on SCr of 0.91 mg/dL). Liver Function Tests: No results for input(s): AST, ALT, ALKPHOS, BILITOT, PROT, ALBUMIN in the last 168 hours. No results for input(s): LIPASE, AMYLASE in the last 168 hours. No results for input(s): AMMONIA in the last 168 hours. Coagulation Profile: No results for input(s): INR, PROTIME in the last 168 hours. Cardiac Enzymes: No results for input(s): CKTOTAL, CKMB, CKMBINDEX, TROPONINI in the last 168 hours. BNP (last 3 results) No results for input(s): PROBNP in the last 8760 hours. HbA1C: No results for input(s): HGBA1C in the last 72 hours. CBG: Recent Labs  Lab 09/22/21 1117 09/22/21 1607 09/22/21 2051 09/23/21 0758 09/23/21 1154  GLUCAP 269* 264* 322* 239* 418*   Lipid Profile: No results for input(s): CHOL, HDL, LDLCALC, TRIG, CHOLHDL, LDLDIRECT in the last 72 hours. Thyroid Function Tests: No results for input(s): TSH, T4TOTAL, FREET4, T3FREE, THYROIDAB in the last 72 hours. Anemia Panel: No results for input(s): VITAMINB12, FOLATE, FERRITIN, TIBC, IRON, RETICCTPCT in the last 72 hours. Urine analysis:    Component Value Date/Time   COLORURINE YELLOW 09/20/2021 1429   APPEARANCEUR CLEAR 09/20/2021 1429   LABSPEC 1.015 09/20/2021 1429   PHURINE 6.0 09/20/2021 1429   GLUCOSEU NEGATIVE 09/20/2021 1429   GLUCOSEU 100 (A) 08/13/2020 1550   HGBUR NEGATIVE 09/20/2021 [place] 09/20/2021 1429  KETONESUR NEGATIVE 09/20/2021 1429   PROTEINUR NEGATIVE 09/20/2021 1429   UROBILINOGEN 0.2 08/13/2020 1550   NITRITE POSITIVE (A) 09/20/2021 1429   LEUKOCYTESUR MODERATE (A) 09/20/2021 1429   Sepsis Labs: @LABRCNTIP (procalcitonin:4,lacticidven:4)  ) Recent Results (from the past 240 hour(s))  Urine Culture     Status: Abnormal   Collection Time: 09/20/21  2:29 PM   Specimen: Urine, Clean Catch  Result Value Ref Range Status   Specimen Description  URINE, CLEAN CATCH  Final   Special Requests   Final    NONE Performed at [facility], [place] [address] [place], [place] [address]    Culture >=100,000 COLONIES/mL ESCHERICHIA COLI (A)  Final   Report Status 09/22/2021 FINAL  Final   Organism ID, Bacteria ESCHERICHIA COLI (A)  Final      Susceptibility   Escherichia coli - MIC*    AMPICILLIN >=32 RESISTANT Resistant     CEFAZOLIN >=64 RESISTANT Resistant     CEFEPIME <=0.12 SENSITIVE Sensitive     CEFTRIAXONE 32 RESISTANT Resistant     CIPROFLOXACIN <=0.25 SENSITIVE Sensitive     GENTAMICIN <=1 SENSITIVE Sensitive     IMIPENEM <=0.25 SENSITIVE Sensitive     NITROFURANTOIN <=16 SENSITIVE Sensitive     TRIMETH/SULFA <=20 SENSITIVE Sensitive     AMPICILLIN/SULBACTAM >=32 RESISTANT Resistant     PIP/TAZO 8 SENSITIVE Sensitive     * >=100,000 COLONIES/mL ESCHERICHIA COLI  Culture, blood (routine x 2)     Status: None (Preliminary result)   Collection Time: 09/20/21  6:08 PM   Specimen: BLOOD LEFT HAND  Result Value Ref Range Status   Specimen Description BLOOD LEFT HAND  Final   Special Requests IN PEDIATRIC BOTTLE Blood Culture adequate volume  Final   Culture   Final    NO GROWTH 3 DAYS Performed at [facility], [place] [address] [place], [place] [address]    Report Status PENDING  Incomplete  Culture, blood (routine x 2)     Status: None (Preliminary result)   Collection Time: 09/20/21  6:08 PM   Specimen: BLOOD LEFT HAND  Result Value Ref Range Status   Specimen Description BLOOD LEFT HAND  Final   Special Requests IN PEDIATRIC BOTTLE Blood Culture adequate volume  Final   Culture   Final    NO GROWTH 3 DAYS Performed at [facility], [place] [address], [place], [place] [address]    Report Status PENDING  Incomplete  Respiratory (~20 pathogens) panel by PCR     Status: None   Collection Time: 09/20/21  7:02 PM   Specimen: Nasopharyngeal Swab; Respiratory  Result Value Ref Range Status   Adenovirus NOT  DETECTED NOT DETECTED Final   Coronavirus 229E NOT DETECTED NOT DETECTED Final    Comment: (NOTE) The Coronavirus on the Respiratory Panel, DOES NOT test for the novel  Coronavirus (2019 nCoV)    Coronavirus HKU1 NOT DETECTED NOT DETECTED Final   Coronavirus NL63 NOT DETECTED NOT DETECTED Final   Coronavirus OC43 NOT DETECTED NOT DETECTED Final   Metapneumovirus NOT DETECTED NOT DETECTED Final   Rhinovirus / Enterovirus NOT DETECTED NOT DETECTED Final   Influenza A NOT DETECTED NOT DETECTED Final   Influenza B NOT DETECTED NOT DETECTED Final   Parainfluenza Virus 1 NOT DETECTED NOT DETECTED Final   Parainfluenza Virus 2 NOT DETECTED NOT DETECTED Final   Parainfluenza Virus 3 NOT DETECTED NOT DETECTED Final   Parainfluenza Virus 4 NOT DETECTED NOT DETECTED Final  Respiratory Syncytial Virus NOT DETECTED NOT DETECTED Final   Bordetella pertussis NOT DETECTED NOT DETECTED Final   Bordetella Parapertussis NOT DETECTED NOT DETECTED Final   Chlamydophila pneumoniae NOT DETECTED NOT DETECTED Final   Mycoplasma pneumoniae NOT DETECTED NOT DETECTED Final    Comment: Performed at [facility], [place] [address] [place], [place] [address]  MRSA Next Gen by PCR, Nasal     Status: None   Collection Time: 09/22/21  9:11 AM   Specimen: Nasal Mucosa; Nasal Swab  Result Value Ref Range Status   MRSA by PCR Next Gen NOT DETECTED NOT DETECTED Final    Comment: (NOTE) The GeneXpert MRSA Assay (FDA approved for NASAL specimens only), is one component of a comprehensive MRSA colonization surveillance program. It is not intended to diagnose MRSA infection nor to guide or monitor treatment for MRSA infections. Test performance is not FDA approved in patients less than 32 years old. Performed at [facility], [place] [address] [place], [place] [address]      Radiology Studies: ECHOCARDIOGRAM LIMITED  Result Date: 09/22/2021    ECHOCARDIOGRAM LIMITED REPORT   Patient Name:   LENETTA PICHE Date of Exam: 09/21/2021 Medical Rec #:  [id number]          Height:       61.0 in Accession #:    [id number]         Weight:       204.1 lb Date of Birth:  1956/03/19          BSA:          1.905 m Patient Age:    25 years           BP:           113/62 mmHg Patient Gender: F                  HR:           92 bpm. Exam Location:  Inpatient Procedure: Limited Echo and Intracardiac Opacification Agent Indications:    CAD  History:        Patient has prior history of Echocardiogram examinations, most                 recent 08/09/2021. CAD and Previous Myocardial Infarction, Prior                 CABG; Risk Factors:Hypertension, Diabetes and Dyslipidemia.                 Kidney transplant.  Sonographer:    Merrie Roof RDCS Referring Phys: [id number] [place]  1. Apical psuedoaneuyrsm post MI. Left ventricular ejection fraction, by estimation, is 55 to 60%. The left ventricle has normal function. The left ventricle demonstrates regional wall motion abnormalities (see scoring diagram/findings for description). FINDINGS  Left Ventricle: Apical psuedoaneuyrsm post MI. Left ventricular ejection fraction, by estimation, is 55 to 60%. The left ventricle has normal function. The left ventricle demonstrates regional wall motion abnormalities. Definity contrast agent was given  IV to delineate the left ventricular endocardial borders.  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 Candee Furbish MD Electronically signed by Candee Furbish MD Signature Date/Time: 09/22/2021/11:18:36 AM    Final      Scheduled Meds:  apixaban  5 mg Oral BID   arformoterol  15 mcg Nebulization BID   azaTHIOprine  125 mg Oral Q1500   budesonide  2 mL  Nebulization BID   clopidogrel  75 mg Oral Daily   furosemide  40 mg Oral Daily   gabapentin  100 mg Oral QHS   insulin aspart  0-15 Units Subcutaneous TID WC   insulin aspart  8 Units Subcutaneous TID WC   insulin glargine-yfgn  10 Units Subcutaneous Once    insulin glargine-yfgn  25 Units Subcutaneous QAC breakfast   ipratropium-albuterol  3 mL Nebulization QID   isosorbide mononitrate  60 mg Oral Daily   lamoTRIgine  25 mg Oral QHS   metoprolol tartrate  25 mg Oral TID   montelukast  10 mg Oral QHS   pantoprazole  40 mg Oral Daily   polyethylene glycol  17 g Oral Daily   predniSONE  10 mg Oral Q breakfast   ranolazine  1,000 mg Oral BID   rosuvastatin  20 mg Oral Daily   sertraline  100 mg Oral Daily   sodium chloride flush  3 mL Intravenous Q12H   Continuous Infusions:  sodium chloride 250 mL (09/22/21 2247)   ceFEPime (MAXIPIME) IV 2 g (09/23/21 0941)     LOS: 3 days    Time spent: 83min  Domenic Polite, MD [facility]   09/23/2021, 12:11 PM

## 2021-09-23 NOTE — Progress Notes (Signed)
Progress Note  Patient Name: Katherine [name] Date of Encounter: 09/23/2021  [facility] Cardiologist: [name], MD   Subjective   Pt denies CP; mild dyspnea  Inpatient Medications    Scheduled Meds:  apixaban  5 mg Oral BID   arformoterol  15 mcg Nebulization BID   azaTHIOprine  125 mg Oral Q1500   budesonide  2 mL Nebulization BID   clopidogrel  75 mg Oral Daily   furosemide  40 mg Oral Daily   gabapentin  100 mg Oral QHS   insulin aspart  0-15 Units Subcutaneous TID WC   insulin aspart  6 Units Subcutaneous TID WC   insulin glargine-yfgn  25 Units Subcutaneous QAC breakfast   isosorbide mononitrate  60 mg Oral Daily   lamoTRIgine  25 mg Oral QHS   metoprolol tartrate  25 mg Oral TID   montelukast  10 mg Oral QHS   pantoprazole  40 mg Oral Daily   polyethylene glycol  17 g Oral Daily   predniSONE  10 mg Oral Q breakfast   ranolazine  1,000 mg Oral BID   rosuvastatin  20 mg Oral Daily   sertraline  100 mg Oral Daily   sodium chloride flush  3 mL Intravenous Q12H   Continuous Infusions:  sodium chloride 250 mL (09/22/21 2247)   ceFEPime (MAXIPIME) IV     PRN Meds: sodium chloride, acetaminophen **OR** acetaminophen, LORazepam, menthol-cetylpyridinium, nitroGLYCERIN, ondansetron **OR** ondansetron (ZOFRAN) IV, oxyCODONE, sodium chloride, sodium chloride flush   Vital Signs    Vitals:   09/23/21 0543 09/23/21 0626 09/23/21 0742 09/23/21 0744  BP: 133/70     Pulse: 67     Resp: 16     Temp: 98 F (36.7 C)     TempSrc: Oral     SpO2: 93%  92% 97%  Weight:  94.6 kg    Height:        Intake/Output Summary (Last 24 hours) at 09/23/2021 0753 Last data filed at 09/23/2021 0547 Gross per 24 hour  Intake 560 ml  Output 750 ml  Net -190 ml    Last 3 Weights 09/23/2021 09/22/2021 09/21/2021  Weight (lbs) 208 lb 9.6 oz 204 lb 12.9 oz 204 lb 2.3 oz  Weight (kg) 94.62 kg 92.9 kg 92.6 kg      Telemetry    Sinus with PVCs and 3 beats NSVT - Personally  Reviewed   Physical Exam   GEN: chronically ill; NAD Neck: supple Cardiac: RRR Respiratory: CTA GI: Soft, NT  MS: No edema Neuro:  Grossly intact Psych: Normal affect   Labs    High Sensitivity Troponin:   Recent Labs  Lab 09/20/21 1302 09/20/21 1827 09/20/21 2045 09/21/21 0752 09/21/21 0947  TROPONINIHS 366* 457* 563* 461* 400*      Chemistry Recent Labs  Lab 09/20/21 1102 09/21/21 0638 09/22/21 0605 09/23/21 0249  NA 135 133* 129* 130*  K 3.2* 4.3 3.6 4.0  CL 96* 95* 90* 92*  CO2 25 28 28 27   GLUCOSE 216* 321* 380* 376*  BUN 16 16 20 21   CREATININE 0.89 0.89 1.10* 0.91  CALCIUM 9.1 8.9 8.4* 8.4*  MG 1.3* 1.7  --   --   GFRNONAA >60 >60 56* >60  ANIONGAP 14 10 11 11      Hematology Recent Labs  Lab 09/20/21 1102 09/21/21 0638 09/22/21 0605  WBC 14.8* 8.1 13.4*  RBC 3.16* 2.92* 2.81*  HGB 9.2* 8.4* 8.2*  HCT 29.4* 26.9* 26.3*  MCV 93.0 92.1 93.6  MCH 29.1 28.8 29.2  MCHC 31.3 31.2 31.2  RDW 17.5* 17.6* 17.7*  PLT 409* 363 340     BNP Recent Labs  Lab 09/20/21 1126  BNP 423.8*      Radiology    ECHOCARDIOGRAM LIMITED  Result Date: 09/22/2021    ECHOCARDIOGRAM LIMITED REPORT   Patient Name:   Katherine [name] Date of Exam: 09/21/2021 Medical Rec #:  [id number]          Height:       61.0 in Accession #:    [id number]         Weight:       204.1 lb Date of Birth:  01-07-1956          BSA:          1.905 m Patient Age:    28 years           BP:           113/62 mmHg Patient Gender: F                  HR:           92 bpm. Exam Location:  Inpatient Procedure: Limited Echo and Intracardiac Opacification Agent Indications:    CAD  History:        Patient has prior history of Echocardiogram examinations, most                 recent 08/09/2021. CAD and Previous Myocardial Infarction, Prior                 CABG; Risk Factors:Hypertension, Diabetes and Dyslipidemia.                 Kidney transplant.  Sonographer:    Merrie Roof RDCS Referring Phys:  [id number] [place]  1. Apical psuedoaneuyrsm post MI. Left ventricular ejection fraction, by estimation, is 55 to 60%. The left ventricle has normal function. The left ventricle demonstrates regional wall motion abnormalities (see scoring diagram/findings for description). FINDINGS  Left Ventricle: Apical psuedoaneuyrsm post MI. Left ventricular ejection fraction, by estimation, is 55 to 60%. The left ventricle has normal function. The left ventricle demonstrates regional wall motion abnormalities. Definity contrast agent was given  IV to delineate the left ventricular endocardial borders.  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 Candee Furbish MD Electronically signed by Candee Furbish MD Signature Date/Time: 09/22/2021/11:18:36 AM    Final     Patient Profile     66 y.o. female with past medical history of coronary artery disease status post coronary artery bypass graft, end-stage renal disease status post transplant, history of 2 recent attempts at cardiac catheterization which were unsuccessful due to inability to get vascular access being evaluated for unstable angina/non-ST elevation myocardial infarction.  Assessment & Plan    1 unstable angina/non ST elevation myocardial infarction-heparin has been discontinued and patient remains pain-free.  As outlined in previous notes patient has had 2 recent attempts at cardiac catheterization both of which were unsuccessful due to inability to gain vascular access.  Plan medical therapy only.  Plan to continue Plavix, statin, beta-blocker and nitrates.  Echocardiogram shows preserved LV function with apical pseudoaneurysm.  Palliative care is evaluating the patient for possible hospice.  2 acute on chronic combined systolic/diastolic congestive heart failure-she appears to be euvolemic this morning.  We will continue Lasix 40 mg daily.  Needs fluid restriction and low-sodium diet.  3 history of renal transplant-check  potassium and renal function 1 week following discharge.  4 paroxysmal atrial fibrillation-patient remains in sinus rhythm.  Continue metoprolol and apixaban.  Patient can be discharged from a cardiac standpoint.  We will arrange follow-up with APP in 2 to 4 weeks and Dr. Ellyn Hack in 3 months.  Would continue present medications at discharge.  Check potassium and renal function 1 week following discharge.  Cardiology will sign off.  Please call with questions.  For questions or updates, please contact [place] Please consult [email or web address] for contact info under        Signed, Kirk Ruths, MD  09/23/2021, 7:54 AM

## 2021-09-24 ENCOUNTER — Telehealth: Payer: Self-pay | Admitting: Internal Medicine

## 2021-09-24 DIAGNOSIS — F32A Depression, unspecified: Secondary | ICD-10-CM

## 2021-09-24 DIAGNOSIS — E785 Hyperlipidemia, unspecified: Secondary | ICD-10-CM

## 2021-09-24 DIAGNOSIS — I509 Heart failure, unspecified: Secondary | ICD-10-CM

## 2021-09-24 DIAGNOSIS — J438 Other emphysema: Secondary | ICD-10-CM

## 2021-09-24 DIAGNOSIS — E1142 Type 2 diabetes mellitus with diabetic polyneuropathy: Secondary | ICD-10-CM

## 2021-09-24 DIAGNOSIS — I480 Paroxysmal atrial fibrillation: Secondary | ICD-10-CM

## 2021-09-24 DIAGNOSIS — I25110 Atherosclerotic heart disease of native coronary artery with unstable angina pectoris: Secondary | ICD-10-CM

## 2021-09-24 DIAGNOSIS — Z7189 Other specified counseling: Secondary | ICD-10-CM

## 2021-09-24 DIAGNOSIS — F419 Anxiety disorder, unspecified: Secondary | ICD-10-CM

## 2021-09-24 LAB — GLUCOSE, CAPILLARY
Glucose-Capillary: 181 mg/dL — ABNORMAL HIGH (ref 70–99)
Glucose-Capillary: 544 mg/dL (ref 70–99)
Glucose-Capillary: 549 mg/dL (ref 70–99)
Glucose-Capillary: 316 mg/dL — ABNORMAL HIGH (ref 70–99)
Glucose-Capillary: 228 mg/dL — ABNORMAL HIGH (ref 70–99)

## 2021-09-24 MED ORDER — OXYCODONE HCL 5 MG/5ML PO SOLN
5.0000 mg | ORAL | Status: DC | PRN
  Administered 2021-09-24 – 2021-09-26 (×9): 5 mg via ORAL
  Filled 2021-09-24 (×10): qty 5

## 2021-09-24 MED ORDER — IPRATROPIUM-ALBUTEROL 0.5-2.5 (3) MG/3ML IN SOLN
3.0000 mL | Freq: Three times a day (TID) | RESPIRATORY_TRACT | Status: DC
  Administered 2021-09-24 – 2021-09-26 (×6): 3 mL via RESPIRATORY_TRACT
  Filled 2021-09-24 (×6): qty 3

## 2021-09-24 MED ORDER — LORAZEPAM 0.5 MG PO TABS
0.5000 mg | ORAL_TABLET | Freq: Four times a day (QID) | ORAL | Status: DC | PRN
  Administered 2021-09-24 – 2021-09-26 (×5): 0.5 mg via ORAL
  Filled 2021-09-24 (×6): qty 1

## 2021-09-24 MED ORDER — NYSTATIN 100000 UNIT/ML MT SUSP
5.0000 mL | Freq: Four times a day (QID) | OROMUCOSAL | Status: DC
  Administered 2021-09-24 – 2021-09-26 (×9): 500000 [IU] via ORAL
  Filled 2021-09-24 (×7): qty 5

## 2021-09-24 NOTE — Discharge Instructions (Signed)
POST NSTEMI: NO HEAVY LIFTING X 2 WEEKS. NO SEXUAL ACTIVITY X 2 WEEKS. NO DRIVING X 1 WEEK. NO SOAKING BATHS, HOT TUBS, POOLS, ETC., X 7 DAYS.  Radial Site Care: Refer to this sheet in the next few weeks. These instructions provide you with information on caring for yourself after your procedure. Your caregiver may also give you more specific instructions. Your treatment has been planned according to current medical practices, but problems sometimes occur. Call your caregiver if you have any problems or questions after your procedure. HOME CARE INSTRUCTIONS You may shower the day after the procedure. Remove the bandage (dressing) and gently wash the site with plain soap and water. Gently pat the site dry.  Do not apply powder or lotion to the site.  Do not submerge the affected site in water for 3 to 5 days.  Inspect the site at least twice daily.  Do not flex or bend the affected arm for 24 hours.  No lifting over 5 pounds (2.3 kg) for 5 days after your procedure.  Do not drive home if you are discharged the same day of the procedure. Have someone else drive you.  What to expect: Any bruising will usually fade within 1 to 2 weeks.  Blood that collects in the tissue (hematoma) may be painful to the touch. It should usually decrease in size and tenderness within 1 to 2 weeks.  SEEK IMMEDIATE MEDICAL CARE IF: You have unusual pain at the radial site.  You have redness, warmth, swelling, or pain at the radial site.  You have drainage (other than a small amount of blood on the dressing).  You have chills.  You have a fever or persistent symptoms for more than 72 hours.  You have a fever and your symptoms suddenly get worse.  Your arm becomes pale, cool, tingly, or numb.  You have heavy bleeding from the site. Hold pressure on the site.

## 2021-09-24 NOTE — TOC Progression Note (Signed)
Transition of Care [facility]) - Progression Note    Patient Details  Name: Katherine [name] MRN: [id number] Date of Birth: 04-03-1956  Transition of [facility] CM/SW Contact  Graves-Bigelow, Ocie Cornfield, RN Phone Number: 09/24/2021, 3:24 PM  Clinical Narrative: Case Manager spoke with patient post meeting with [place] Nurse Practitioner and offered choice- patient wanted to speak with the Liaison with [facility]. Case Manager called the [place] and she spoke with the patient and discussed the services offered. Patient is agreeable to [facility] with Arville Go. [place] will assist in durable medical equipment for the home. Case Manager will continue to follow for additional transition of care needs.   Expected Discharge Plan: Home w Hospice Care Barriers to Discharge: Continued Medical Work up  Expected Discharge Plan and Services Expected Discharge Plan: [place] In-house Referral: Hospice / Palliative Care Discharge Planning Services: CM Consult Post Acute Care Choice: [place] arrangements for the past 2 months: Single Family Home                   HH Arranged: RN [place] Agency: Other - See comment ([place]) Date HH Agency Contacted: 09/24/21 Time [place] Level: 90 Representative spoke with at [place]: Raynelle Dick   Readmission Risk Interventions Readmission Risk Prevention Plan 09/23/2021 09/08/2021 05/14/2021  Transportation Screening Complete Complete Complete  HRI or [place] for [place] - - -  Medication Review Press photographer) Complete Complete Complete  PCP or Specialist appointment within 3-5 days of discharge Complete Complete Complete  HRI or [place] Complete Complete Complete  SW Recovery Care/Counseling Consult Complete Complete Complete  Palliative Care Screening Complete Not Applicable Not  [place] Not Applicable Not Applicable Not Applicable  Some recent data might be hidden

## 2021-09-24 NOTE — Telephone Encounter (Signed)
Paperwork has been signed by PCP and faxed over to Nurse Levada Dy.

## 2021-09-24 NOTE — Progress Notes (Addendum)
Daily Progress Note   Patient Name: Katherine [name]       Date: 09/24/2021 DOB: 06/08/1956  Age: 66 y.o. MRN#: [id number] Attending Physician: Tawni Millers Primary Care Physician: [name], MD Admit Date: 09/20/2021  Reason for Consultation/Follow-up: Establishing goals of care  Patient Profile/HPI:  66 y.o. female  with past medical history of CAD status post CABG and PCI, ESRD status post transplant and on immunosuppressant, 2 failed attempts at cardiac catheterization due to no vascular access, PAD, PAF (Eliquis), COPD, chronic respiratory failure with hypoxia (3 L baseline supplemental oxygen use at home), IDDM T, GERD, urolithiasis, anxiety, depression, fibromyalgia, anemia, and history of GI bleed admitted on 09/20/2021 with SOB, CP, and leg swelling. Workup reveals recurrent NSTEMI with unstable angina cause heart failure- there are no options for interventions- not eligible for cath. She was recently hospitalized 1/17-1/26 with heart failure r/t NSTEMI, unstable angina- cardiac cath attempted, but unable to be completed- cardiology recommended Hospice during that admission. She was also hospitalized 12/9-1/9 for COVID and MRSA pneumonia during which time she also had NSTEMI without options for interventions.   Subjective: Contacted by transition of care provider that [facility] declined to accept patient for Hospice services- no clear reason given. Discussed this with Katherine [name]- she would like to be evaluated by another hospice service.  Met with Katherine [name]- clarified her functional status- she is unable to ambulate in her home without becoming short of breath. She has chest pain and shortness of breath at rest.  Per cardiology there are no further options for advanced  interventions and she has been optimized medically.  Based on her recurrent hospitalizations, significant symptoms at rest, decline in functional status, and not a candidate for further invasive procedures she appears to be eligible for hospice based on her heart disease.   Katherine [name] shares her frustration and says she is tired and just wants to be home and comfortable and "through with all of this". She is very overwhelmed.    Review of Systems  Constitutional:  Positive for malaise/fatigue.  Psychiatric/Behavioral:  The patient is nervous/anxious.     Physical Exam Vitals and nursing note reviewed.  Constitutional:      Appearance: She is ill-appearing.  Cardiovascular:     Rate and Rhythm: Normal rate.  Pulmonary:     Effort:  Pulmonary effort is normal.  Neurological:     Mental Status: She is alert and oriented to person, place, and time.  Psychiatric:        Mood and Affect: Mood normal.        Behavior: Behavior normal.            Vital Signs: BP 120/60    Pulse 87    Temp 99 F (37.2 C) (Oral)    Resp 16    Ht $R'5\' 1"'of$  (1.549 m)    Wt 89.4 kg    SpO2 95%    BMI 37.22 kg/m  SpO2: SpO2: 95 % O2 Device: O2 Device: High Flow Nasal Cannula O2 Flow Rate: O2 Flow Rate (L/min): 4 L/min  Intake/output summary:  Intake/Output Summary (Last 24 hours) at 09/24/2021 1249 Last data filed at 09/24/2021 0557 Gross per 24 hour  Intake 240 ml  Output 4900 ml  Net -4660 ml   LBM: Last BM Date: 09/19/21 Baseline Weight: Weight: 86.2 kg Most recent weight: Weight: 89.4 kg       Palliative Assessment/Data: PPS: 30%      Patient Active Problem List   Diagnosis Date Noted   E-coli UTI 09/22/2021   questionable pneumonia on CXR 09/20/2021   Dysuria 09/20/2021   Rest pain of lower extremity due to atherosclerosis ([place]) 09/02/2021   Longstanding persistent atrial fibrillation (HCC)    MRSA pneumonia ([place]) 08/09/2021   Chronic diastolic CHF (congestive heart failure) ([place]) 08/06/2021    Obesity (BMI 30-39.9) 08/05/2021   Acute on chronic respiratory failure with hypoxia (HCC) 08/04/2021   Pressure injury of skin 08/04/2021   Polymorphic ventricular tachycardia    Bradycardia    COPD (chronic obstructive pulmonary disease) (HCC)    History of renal transplant    Nausea & vomiting 04/19/2021   Coronary stent restenosis due to scar tissue    Acute on chronic systolic CHF (congestive heart failure) ([place]) 01/29/2021   Non-ST elevation (NSTEMI) myocardial infarction [facility])    Chronic respiratory failure with hypoxia ([place]) 01/01/2021   Need for pneumococcal vaccination 08/13/2020   Candidal skin infection 08/13/2020   Anxiety and depression 08/13/2020   Chronic bronchitis, mucopurulent ([place]) 08/24/2019   Current chronic use of systemic steroids 07/23/2019   OSA (obstructive sleep apnea)    Severe episode of recurrent major depressive disorder, with psychotic features ([place]) 02/16/2019   Obstructive chronic bronchitis without exacerbation ([place]) 11/03/2018   Long term (current) use of antithrombotics/antiplatelets 04/28/2018   Squamous cell carcinoma of skin of right thumb 02/02/2018   LBBB (left bundle branch block) 10/07/2017   Primary osteoarthritis of right knee 08/18/2017   Ovarian cyst 05/18/2017   Pelvic mass in female 03/11/2017   Mild aortic stenosis by prior echocardiogram 01/28/2017   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   Deficiency anemia 10/14/2016   Normocytic anemia 10/13/2016   Fibromyalgia 03/30/2016   Other spondylosis with radiculopathy, lumbar region 03/30/2016   Type 2 diabetes mellitus with peripheral neuropathy ([place]) 03/30/2016   Paroxysmal atrial fibrillation ([place]); CHA2DS2VASc score F, HTN, CAD, CVA = 5 06/28/2014   Debility 06/28/2014   Fluttering heart 06/21/2014   CAD S/P percutaneous coronary angioplasty - PCI x 5 to SVG-D1 09/11/2013   Essential hypertension    NSTEMI (non-ST elevated myocardial infarction) with CAD S/P percutaneous coronary  angioplasty - PCI x 5 to SVG-D1 08/23/2013   PAD (peripheral artery disease) ([place]) 08/17/2013   CAD (coronary artery disease) of bypass graft 10/08/2012   Stenosis of right  carotid artery without infarction 10/08/2012   Dyslipidemia, goal LDL below 70 10/08/2012   Mitral annular calcification 10/08/2012   Hypomagnesemia 06/13/2012   Hypokalemia and hypomagnesia  06/12/2012   Renal transplant disorder 06/12/2012   Chronic allergic rhinitis 04/29/2011   Extrinsic asthma 09/09/2007   GERD 09/09/2007   COUGH, CHRONIC 09/09/2007   Morbid obesity - s/p Lap Band 9/'05 05/07/2004   Hx of CABG 09/07/1993   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 07/1993   Coronary artery disease involving native coronary artery of native heart with angina pectoris ([place]) 07/1993   ESRD (end stage renal disease) ([place]) [place]    Assessment/Recommendations/Plan  TOC to refer to another [place] for evaluation-  Based on her recurrent hospitalizations, significant symptoms at rest, unstable angina, decline in functional status, and not a candidate for further invasive procedures she appears to be eligible for hospice based on her heart disease.   On discharge, would recommend scripts for: - Morphine Concentrate 10mg /0.50ml: 5mg  (0.17ml) sublingual every 1 hour as needed for pain or shortness of breath: Disp 66ml - Lorazepam 2mg /ml concentrated solution: 1mg  (0.31ml) sublingual every 4 hours as needed for anxiety: Disp 77ml - Haldol 2mg /ml solution: 0.5mg  (0.32ml) sublingual every 4 hours as needed for agitation or nausea: Disp 84ml     Code Status: DNR  Prognosis:  < 6 months  Discharge Planning: Home with Hospice  Care plan was discussed with patient and care team.   Thank you for allowing the Palliative Medicine Team to assist in the care of this patient.  Total time:  Prolonged billing:      Greater than 50%  of this time  was spent counseling and coordinating care related to the above assessment and plan.  Mariana Kaufman, AGNP-C Palliative Medicine   Please contact Palliative Medicine Team phone at [phone] for questions and concerns.

## 2021-09-24 NOTE — Assessment & Plan Note (Addendum)
Patient was placed on a telemetry monitor, rate controlled with metoprolol and anticoagulation with apixaban.

## 2021-09-24 NOTE — Progress Notes (Signed)
Progress Note   Patient: Katherine [name] ZOX:[id number] DOB: 08-Jul-1956 DOA: 09/20/2021     4 DOS: the patient was seen and examined on 09/24/2021   Brief hospital course: 82/X w/ complicated h/o CAD s/p CABG and PCI, bilateral carotid artery stenosis, PAD, PAF on Eliquis, COPD, chronic respiratory failure with hypoxia on home oxygen 3 L per minute, IDDM2, s/p renal transplant on immunosuppressant,  urolithiasis, anemia, history of GI bleed,fibromyalgia/chronic pain, recent hospitalization from 07/25/2021-08/25/2021, for COVID with MRSA pneumonia. 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 -Readmitted with angina, NSTEMI 1/17-1/26, heart cath was reattempted 1/20, this was unsuccessful, she was then seen by palliative care, made DNR and discharged home on Imdur, Ranexa, beta-blocker. -Back in the ED 2/4 with shortness of breath and chest pain, leg swelling -In the ED, O2 needs were higher 6 to 7 L, BNP 423, troponin 278, chest x-ray noted interstitial edema and small bilateral effusions. -Admitted seen by cardiology, started on IV Lasix and IV heparin -Palliative care reconsulted, seen by cardiology -options limited    Assessment and Plan: Acute on chronic respiratory failure with hypoxia ([place])- (present on admission) Acute on chronic systolic and diastolic CHF  Patient with slowly improving dyspnea, continue with supplemental 02 4 L/min  Continue diuresis and blood pressure monitoring.  Continue with isosorbide and metoprolol.   Paroxysmal atrial fibrillation (HCC); CHA2DS2VASc score F, HTN, CAD, CVA = 5- (present on admission) Continue rate control with metoprolol and anticoagulation with apixaban   NSTEMI (non-ST elevated myocardial infarction) with CAD S/P percutaneous coronary angioplasty - PCI x 5 to SVG-D1-  (present on admission) Continue medical therapy with plavix, metoprolol, Imdur, statin, Ranexa Patient did received heparin IV for anticoagulation  -2 recent unsuccessful left heart cath attempts on 1/5 and 1/20 Patient will go home with home hospice.   Continue medical therapy as tolerated, today patient with no chest pain.   Type 2 diabetes mellitus with peripheral neuropathy (HCC)- (present on admission) Continue glucose cover and monitoring with insulin sliding scale.  Continue with basal insulin and pre- meal coverage.   Dyslipidemia, goal LDL below 70- (present on admission) Continue with statin therapy.   E-coli UTI Developed dysuria, abnormal UA, urine culture with E. coli resistant to ceftriaxone -ABX changed to cefepime yesterday, consider quinolone at discharge  questionable pneumonia on CXR Clinically do not suspect pneumonia, monitor off antibiotics  Longstanding persistent atrial fibrillation (HCC)- (present on admission) - In sinus rhythm, continue metoprolol -Restarted Eliquis, heparin discontinued  History of renal transplant -Continue immunosuppressants: imuran  On chronic prednisone, 5mg  daily   COPD (chronic obstructive pulmonary disease) ([place])- (present on admission) Chronic respiratory failure on 3 L home O2 -No wheezing at this time, will taper prednisone, continue duo nebs -she is on chronic prednisone 5mg  for her renal transplant   Anxiety and depression- (present on admission) Continue zoloft and ativan   Deficiency anemia- (present on admission) Hgb and hct have been stable.   PAD (peripheral artery disease) ([place])- (present on admission) History of occluded bilateral common iliac arteries and common femoral arteries. Patient is high risk for open surgery.  Medical therapy with clopidogrel and statin therapy.   Essential hypertension- (present on admission) Continue blood pressure control with metoprolol.   Hypokalemia and hypomagnesia -  (present on admission) Hyponatremia.  Continue close follow up on renal  function and electrolytes  Follow up K was 4,0 yesterday with Na 130 and bicarbonate at 27         Subjective: Patient is feeling better, but not back to her baseline, continue to have dyspnea and weakness, plan to go home with hospice   Physical Exam: Vitals:   09/24/21 0800 09/24/21 1053 09/24/21 1359 09/24/21 1702  BP:  120/60  127/64  Pulse: 77 87  82  Resp: 20 16    Temp:      TempSrc:      SpO2:   91%   Weight:      Height:       Neurology awake and alert ENT with mild pallor  Cardiovascular with no murmurs, S1 and S2 present, no gallops  No JVD No lower extremity edema  Respiratory with no wheezing or rhonchi, positive rales Abdomen soft and non tender   Data Reviewed:  Reviewed   Family Communication: no family at the bedside   Disposition: Status is: Inpatient Remains inpatient appropriate because: pending home hospice           Planned Discharge Destination: Home with hospice    Author: Tawni Millers, MD 09/24/2021 6:00 PM  For on call review [email or web address].

## 2021-09-24 NOTE — Hospital Course (Addendum)
Katherine [name] was admitted to the hospital with the working diagnosis of decompensated heart failure, complicated with non st elevation myocardial infarction.   66 yo female with the past medical history of CAD sp CABG, carotid artery stenosis, COPD on home 02, T2DM, atrial fibrillation, depression, fibromyalgia, anemia and ESRD sp renal transplant who presented with dyspnea and chest pain. Patient had a recent hospitalization on 07/2021 to 08/25/2021 for COVID 19 viral pneumonia complicated with MRSA pneumonia.  01/17 to 09/11/2021 for NSTEMI with unsuccessful PCI complicated with hematoma.  At home she developed worsening dyspnea, initially on exertion and then at rest, reported orthopnea. Positive chest pain radiated to her jaw, with nausea. She called EMS and she was transported to the ED. On her initial physical examination her blood pressure was 113/73, HR 112, RR 20 and oxygenation 99 on 6 L/min per HFNC. Patient had diffuse rales bilaterally, no wheezing, heart with S1 and S2 present regular rhythm, abdomen soft and positive lower extremity edema.   Na 135, K 3,2, CL 96, bicarbonate 25, glucose 216, bun 16 and cr at 0,89 BNP 423 High sensitive troponin [id number] Wbc 14.8, hgb 9,2 and hct 29,4, plt 409  SARS COVID 19 negative   UA with specific gravity 1.015, wbc >50 and positive nitrates.   Chest radiograph with bilateral interstitial infiltrates, predominantly at lower lobes.   EKG 97 bpm, left axis deviation, left bundle branch block, sinus rhythm, with no significant ST segment or T wave changes.   Patient was placed on furosemide for diuresis and heparin for anticoagulation.  Improvement in volume status and oxygenation.  02/07 follow up chest radiograph with improvement in infiltrates.   Patient will continue with medical therapy for her coronary artery disease and heart failure. 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   She has decided to go home with hospice services.

## 2021-09-24 NOTE — Plan of Care (Signed)
  Problem: Education: Goal: Ability to demonstrate management of disease process will improve Outcome: Progressing Goal: Ability to verbalize understanding of medication therapies will improve Outcome: Progressing Goal: Individualized Educational Video(s) Outcome: Progressing   Problem: Activity: Goal: Capacity to carry out activities will improve Outcome: Progressing   Problem: Cardiac: Goal: Ability to achieve and maintain adequate cardiopulmonary perfusion will improve Outcome: Progressing   Problem: Education: Goal: Knowledge of General Education information will improve Description: Including pain rating scale, medication(s)/side effects and non-pharmacologic comfort measures Outcome: Progressing   Problem: Health Behavior/Discharge Planning: Goal: Ability to manage health-related needs will improve Outcome: Progressing   Problem: Clinical Measurements: Goal: Ability to maintain clinical measurements within normal limits will improve Outcome: Progressing Goal: Will remain free from infection Outcome: Progressing Goal: Diagnostic test results will improve Outcome: Progressing Goal: Respiratory complications will improve Outcome: Progressing Goal: Cardiovascular complication will be avoided Outcome: Progressing   Problem: Activity: Goal: Risk for activity intolerance will decrease Outcome: Progressing   Problem: Nutrition: Goal: Adequate nutrition will be maintained Outcome: Progressing   Problem: Coping: Goal: Level of anxiety will decrease Outcome: Progressing   Problem: Elimination: Goal: Will not experience complications related to bowel motility Outcome: Progressing Goal: Will not experience complications related to urinary retention Outcome: Progressing   Problem: Pain Managment: Goal: General experience of comfort will improve Outcome: Progressing   Problem: Safety: Goal: Ability to remain free from injury will improve Outcome: Progressing    Problem: Skin Integrity: Goal: Risk for impaired skin integrity will decrease Outcome: Progressing   

## 2021-09-24 NOTE — Plan of Care (Signed)
°  Problem: Clinical Measurements: Goal: Diagnostic test results will improve Outcome: Progressing Goal: Respiratory complications will improve Outcome: Progressing Goal: Cardiovascular complication will be avoided Outcome: Progressing   Problem: Activity: Goal: Risk for activity intolerance will decrease Outcome: Progressing   Problem: Nutrition: Goal: Adequate nutrition will be maintained Outcome: Progressing   Problem: Coping: Goal: Level of anxiety will decrease Outcome: Progressing   Problem: Elimination: Goal: Will not experience complications related to bowel motility Outcome: Progressing Goal: Will not experience complications related to urinary retention Outcome: Progressing   Problem: Pain Managment: Goal: General experience of comfort will improve Outcome: Progressing   Problem: Safety: Goal: Ability to remain free from injury will improve Outcome: Progressing   Problem: Skin Integrity: Goal: Risk for impaired skin integrity will decrease Outcome: Progressing

## 2021-09-24 NOTE — Telephone Encounter (Signed)
Levada Dy from [facility] has called and states pt has had a heart attack and is currently in hospital. Needing an order for hospice.   Pt wishes that Dr. Ronnald Ramp stay her attending provider, with their support for managing symptoms.   Will send over fax now, marked as urgent. Requesting that it be marked high priority, as pt is in grave health.

## 2021-09-24 NOTE — TOC Progression Note (Addendum)
Transition of Care [facility]) - Progression Note    Patient Details  Name: Etna Forquer MRN: [id number] Date of Birth: 08/22/1955  Transition of Care [facility]) CM/SW Contact  Graves-Bigelow, Ocie Cornfield, RN Phone Number: 09/24/2021, 11:15 AM  Clinical Narrative:   Case Manager received a call from [place] making Case Manager aware that per the MD at [facility]; the patient does not meet hospice guidelines at this time. Patient was previously active with [facility] for RN,PT,OT. Patient will need home health orders and F2F once stable. Patient is active with [facility] for palliative services. Case Manager will continue to follow for additional dispositionx needs.   1237 09-24-21 Case Manager spoke with Palliative NP-plan will be to offer choice to another agency for hospice services. Case Manager will continue to follow for additional transition of care needs.   1321 09-24-21 Case Manager spoke with the patient regarding Hospice Services with [place]. Patient was able to look over the brochures and she is agreeable to speak with the liaison [place]. Case Manager will continue to follow post meeting.   Expected Discharge Plan: [place] Barriers to Discharge: Continued Medical Work up  Expected Discharge Plan and Services Expected Discharge Plan: [place] In-house Referral: Hospice / Palliative Care Discharge Planning Services: CM Consult Post Acute Care Choice: [place] arrangements for the past 2 months: Single Family Home                    HH Arranged: RN, Disease Management, PT, OT, Nurse's Aide [place] Agency: [place] ([facility]) Date HH Agency Contacted: 09/24/21 Time [place]: 1113 Representative spoke with at [place]: [place]   Readmission Risk Interventions Readmission Risk Prevention Plan 09/23/2021 09/08/2021 05/14/2021  Transportation Screening Complete  Complete Complete  HRI or [place] for [place] - - -  Medication Review Press photographer) Complete Complete Complete  PCP or Specialist appointment within 3-5 days of discharge Complete Complete Complete  HRI or [place] Complete Complete Complete  SW Recovery Care/Counseling Consult Complete Complete Complete  Palliative Care Screening Complete Not Applicable Not [place] Not Applicable Not Applicable Not Applicable  Some recent data might be hidden

## 2021-09-25 DIAGNOSIS — E669 Obesity, unspecified: Secondary | ICD-10-CM

## 2021-09-25 DIAGNOSIS — I5033 Acute on chronic diastolic (congestive) heart failure: Secondary | ICD-10-CM | POA: Diagnosis present

## 2021-09-25 DIAGNOSIS — E1169 Type 2 diabetes mellitus with other specified complication: Secondary | ICD-10-CM

## 2021-09-25 LAB — CULTURE, BLOOD (ROUTINE X 2)
Culture: NO GROWTH
Culture: NO GROWTH
Special Requests: ADEQUATE
Special Requests: ADEQUATE

## 2021-09-25 LAB — BASIC METABOLIC PANEL
Anion gap: 11 (ref 5–15)
BUN: 17 mg/dL (ref 8–23)
CO2: 33 mmol/L — ABNORMAL HIGH (ref 22–32)
Calcium: 9.2 mg/dL (ref 8.9–10.3)
Chloride: 91 mmol/L — ABNORMAL LOW (ref 98–111)
Creatinine, Ser: 0.93 mg/dL (ref 0.44–1.00)
GFR, Estimated: >60 mL/min (ref >60)
Glucose, Bld: 271 mg/dL — ABNORMAL HIGH (ref 70–99)
Potassium: 4 mmol/L (ref 3.5–5.1)
Sodium: 135 mmol/L (ref 135–145)

## 2021-09-25 LAB — GLUCOSE, CAPILLARY
Glucose-Capillary: 179 mg/dL — ABNORMAL HIGH (ref 70–99)
Glucose-Capillary: 362 mg/dL — ABNORMAL HIGH (ref 70–99)
Glucose-Capillary: 226 mg/dL — ABNORMAL HIGH (ref 70–99)
Glucose-Capillary: 201 mg/dL — ABNORMAL HIGH (ref 70–99)

## 2021-09-25 MED ORDER — PREDNISONE 5 MG PO TABS
5.0000 mg | ORAL_TABLET | Freq: Every day | ORAL | Status: DC
  Administered 2021-09-26: 5 mg via ORAL
  Filled 2021-09-25: qty 1

## 2021-09-25 NOTE — Assessment & Plan Note (Signed)
Calculated BMI is 37.2

## 2021-09-25 NOTE — Progress Notes (Signed)
Inpatient Diabetes Program Recommendations  AACE/ADA: New Consensus Statement on Inpatient Glycemic Control (2015)  Target Ranges:  Prepandial:   less than 140 mg/dL      Peak postprandial:   less than 180 mg/dL (1-2 hours)      Critically ill patients:  140 - 180 mg/dL   Lab Results  Component Value Date   GLUCAP 228 (H) 09/24/2021   HGBA1C 7.9 (H) 08/11/2021    Review of Glycemic Control  Latest Reference Range & Units 09/24/21 07:58 09/24/21 11:45 09/24/21 12:00 09/24/21 16:29 09/24/21 21:16  Glucose-Capillary 70 - 99 mg/dL 181 (H) 544 (HH) 549 (HH) 316 (H) 228 (H)   Diabetes history: DM 2 Outpatient Diabetes medications:  Current orders for Inpatient glycemic control:  Novolog 8 units tid Novolog 0-15 units tid + hs Semglee 25 units Daily  PO prednisone 10 mg Daily  Inpatient Diabetes Program Recommendations:    Note insulin given late yesterday in addition to steroid dose.  -  Increase Novolog meal coverage to 12 units. -  Increase Novolog Correction scale to 0-20 units -  Add Novolog hs scale  Thanks,  Tama Headings RN, MSN, BC-ADM Inpatient Diabetes Coordinator Team Pager [phone] (8a-5p)

## 2021-09-25 NOTE — Evaluation (Signed)
Physical Therapy Evaluation Patient Details Name: Katherine [name] MRN: [id number] DOB: 25-Nov-1955 Today's Date: 09/25/2021  History of Present Illness  Pt is a 66 yo female presenting 2/4 with CP, SOB, and increased O2 needs. Upon work up, pt with CHF exacerbation and NSTEMI. Of note, pt reccently admitted 1/17-1/26 for NSTEMI with unsuccessful coronary angiography via right radial on 01/31/736 complicated by hematoma. PMH: ESRD s/p renal transplant on chronic immunosuppression, CAD s/p CABG and multiple PCI most recently DES to SVG 14 February 2021, PAF on Eliquis, chronic diastolic CHF, COPD, chronic respiratory failure with hypoxia on 3 L supplemental O2 via [place] with exertion and sleep, insulin-dependent T2DM, HTN, HLD, depression/anxiety, chronic pain.   Clinical Impression  Pt in bed upon arrival of PT, agreeable to evaluation at this time. Prior to admission the pt was mobilizing short distances in the home with use of her rollator, requiring assist from family for bathing, but reports independent with mobilizing to the bathroom for toileting. The pt now presents with limitations in functional mobility, activity tolerance, and mobility due to above dx, and will continue to benefit from skilled PT to address these deficits. The pt was able to demo good bed mobility and stand independently without need for DME to complete steps to [facility]. The pt did desat to 86% with 4L with this exertion, but recovered quickly to 90s with seated rest. Given poor endurance, will continue to benefit from rollator use or transport chair use (DME pt already has) for longer mobility. Pt reports she is d/c home with hospice care and would not like to continue PT. No further questions at this time. PT will sign off, thank you for the consult.       Recommendations for follow up therapy are one component of a multi-disciplinary discharge planning process, led by the attending physician.  Recommendations may be updated based on  patient status, additional functional criteria and insurance authorization.  Follow Up Recommendations No PT follow up (patient d/c with home hospice states she would not like to continue PT)    Assistance Recommended at Discharge Frequent or constant Supervision/Assistance  Patient can return home with the following  A lot of help with bathing/dressing/bathroom;Assistance with cooking/housework;Assistance with feeding;Assist for transportation;Help with stairs or ramp for entrance;A little help with walking and/or transfers    Equipment Recommendations Hospital bed (pt has all other needed DME)  Recommendations for Other Services       Functional Status Assessment Patient has had a recent decline in their functional status and demonstrates the ability to make significant improvements in function in a reasonable and predictable amount of time.     Precautions / Restrictions Precautions Precautions: Fall Precaution Comments: monitor sats/chest pain/anxiety Restrictions Weight Bearing Restrictions: No      Mobility  Bed Mobility Overal bed mobility: Needs Assistance Bed Mobility: Supine to Sit, Sit to Supine     Supine to sit: Supervision Sit to supine: Supervision   General bed mobility comments: supervision for safety and line management. no assist given    Transfers Overall transfer level: Needs assistance Equipment used: None Transfers: Sit to/from Stand Sit to Stand: Min guard Stand pivot transfers: Min guard         General transfer comment: pt able to stand and ambulate to [facility], no UE support or physical assist given. 4L O2 with SpO2 to 86    Ambulation/Gait Ambulation/Gait assistance: Min guard Gait Distance (Feet): 4 Feet Assistive device: None Gait Pattern/deviations: Step-through pattern, Decreased stride  length Gait velocity: decr Gait velocity interpretation: <1.31 ft/sec, indicative of household ambulator   General Gait Details: slow, shuffling  gait, pt self-limiting to short mobility in the room to [facility]      Balance Overall balance assessment: Needs assistance Sitting-balance support: Feet supported Sitting balance-Leahy Scale: Good Sitting balance - Comments: sits unsupported EOB with supervision.   Standing balance support: No upper extremity supported, During functional activity Standing balance-Leahy Scale: Fair Standing balance comment: able to ambulate short distance without UE support                             Pertinent Vitals/Pain Pain Assessment Pain Assessment: No/denies pain Faces Pain Scale: Hurts a little bit Pain Location: generalized Pain Descriptors / Indicators: Discomfort, Grimacing, Guarding Pain Intervention(s): Limited activity within patient's tolerance, Monitored during session, Premedicated before session, Repositioned    Home Living Family/patient expects to be discharged to:: Private residence Living Arrangements: Other relatives (goddaughter and brother) Available Help at Discharge: Family;Available 24 hours/day Type of Home: House Home Access: Stairs to enter Entrance Stairs-Rails: Left Entrance Stairs-Number of Steps: 5   Home Layout: One level Home Equipment: Rollator (4 wheels);Cane - single point;Tub bench;Grab bars - toilet;Wheelchair - manual Additional Comments: Has BSC that she keeps in her bathtub, furniture walks around her house, wears 3L supplemental O2 at home    Prior Function Prior Level of Function : Independent/Modified Independent             Mobility Comments: pt reports using rollator in the home walking to bathroom but limited to the home ADLs Comments: pt reports use of bird bath as she is unable to manage getting into bathtub, rollator for mobility in the home     Hand Dominance   Dominant Hand: Right    Extremity/Trunk Assessment   Upper Extremity Assessment Upper Extremity Assessment: Overall WFL for tasks assessed    Lower Extremity  Assessment Lower Extremity Assessment: Generalized weakness    Cervical / Trunk Assessment Cervical / Trunk Assessment: Other exceptions Cervical / Trunk Exceptions: large body habitus  Communication   Communication: No difficulties  Cognition Arousal/Alertness: Awake/alert Behavior During Therapy: WFL for tasks assessed/performed Overall Cognitive Status: Within Functional Limits for tasks assessed                                 General Comments: pt agreeable to session, able to state her needs        General Comments General comments (skin integrity, edema, etc.): SpO2 stable at rest on 4L, low of 86% with exertion of transfer to [facility]    Exercises     Assessment/Plan    PT Assessment Patient does not need any further PT services  PT Problem List Decreased strength;Decreased activity tolerance;Decreased mobility;Cardiopulmonary status limiting activity;Pain       PT Treatment Interventions DME instruction;Gait training;Stair training;Functional mobility training;Therapeutic activities;Therapeutic exercise;Balance training;Cognitive remediation;Patient/family education    PT Goals (Current goals can be found in the Care Plan section)  Acute Rehab PT Goals Patient Stated Goal: to go home PT Goal Formulation: With patient Time For Goal Achievement: 10/01/21 Potential to Achieve Goals: Good     AM-PAC PT "6 Clicks" Mobility  Outcome Measure Help needed turning from your back to your side while in a flat bed without using bedrails?: None Help needed moving from lying on your back to sitting on  the side of a flat bed without using bedrails?: None Help needed moving to and from a bed to a chair (including a wheelchair)?: A Little Help needed standing up from a chair using your arms (e.g., wheelchair or bedside chair)?: A Little Help needed to walk in hospital room?: A Little Help needed climbing 3-5 steps with a railing? : A Little 6 Click Score: 20    End  of Session Equipment Utilized During Treatment: Gait belt;Oxygen Activity Tolerance: Patient tolerated treatment well Patient left: in bed;with call bell/phone within reach Nurse Communication: Mobility status PT Visit Diagnosis: Muscle weakness (generalized) (M62.81);Difficulty in walking, not elsewhere classified (R26.2);Unsteadiness on feet (R26.81);Other abnormalities of gait and mobility (R26.89)    Time: 1000-1012 (+ 1030 - 1052 (22 min)) PT Time Calculation (min) (ACUTE ONLY): 12 min   Charges:   PT Evaluation $PT Eval Moderate Complexity: 1 Mod PT Treatments $Therapeutic Activity: 8-22 mins        [place], PT, DPT   Acute Rehabilitation Department Pager #: [phone]  Sandra Cockayne 09/25/2021, 11:06 AM

## 2021-09-25 NOTE — Progress Notes (Signed)
Progress Note   Patient: Katherine [name] IZT:[id number] DOB: [birth year] DOA: 09/20/2021     5 DOS: the patient was seen and examined on 09/25/2021   Brief hospital course: Katherine [name] was admitted to the hospital with the working diagnosis of decompensated heart failure.   66 yo female with the past medical history of CAD sp CABG, carotid artery stenosis, COPD on home 02, T2DM, atrial fibrillation, depression, fibromyalgia, anemia and ESRD sp renal transplant who presented with dyspnea and chest pain. Patient had a recent hospitalization on 07/2021 to 08/25/2021 for COVID 19 viral pneumonia complicated with MRSA pneumonia.  01/17 to 09/11/2021 for NSTEMI with unsuccessful PCI complicated with hematoma.  At home she developed worsening dyspnea, initially on exertion and the at rest, reported orthopnea. Positive chest pain radiated to her jaw, with nausea. She called EMS and she was transported to the ED. On her initial physical examination her blood pressure was 113/73, HR 112, RR 20 and oxygenation 99 on 6 L/min per HFNC. Patient had diffuse rales bilaterally, no wheezing, heart with S1 and S2 present regular rhythm, abdomen soft and positive lower extremity edema.   Na 135, K 3,2, CL 96, bicarbonate 25, glucose 216, bun 16 and cr at 0,89 BNP 423 High sensitive troponin [phone] Wbc 14.8, hgb 9,2 and hct 29,4, plt 409  SARS COVID 19 negative   UA with specific gravity 1.015, wbc >50 and positive nitrates.   Chest radiograph with bilateral interstitial infiltrates, predominantly at lower lobes.   EKG 97 bpm, left axis deviation, left bundle branch block, sinus rhythm, with no significant ST segment or T wave changes.   Patient was placed on furosemide for diuresis and heparin for anticoagulation.  Improvement in volume status and oxygenation.  02/07 follow up chest radiograph with improvement in infiltrates.    Assessment and Plan: Acute on chronic respiratory failure with  hypoxia ([place])- (present on admission) Patient with improved oxygenation and dyspnea.  At home uses 4 L/min 02 per [place]  Today her oxygenation is 95 on 4 L/min per HFNC Plan to transition to regular nasal cannula to keep 02 saturation 88% or greater. Patient had recent COVID 19 viral pneumonia and suspected lung injury.   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   Urine output 2,400 cc over last 24 hrs for a net negative fluid balance of 5,696 since admission.  Plan to continue diuresis with furosemide 40 mg  Continue blood pressure monitoring.    NSTEMI (non-ST elevated myocardial infarction) with CAD S/P percutaneous coronary angioplasty - PCI x 5 to SVG-D1- (present on admission) Continue medical therapy with plavix, metoprolol, Imdur, statin, Ranexa Patient did received heparin IV for anticoagulation  -2 recent unsuccessful left heart cath attempts on 1/5 and 1/20 Patient will go home with home hospice.   Continue medical therapy as tolerated, today patient with no chest pain.   Paroxysmal atrial fibrillation ([place]); CHA2DS2VASc score F, HTN, CAD, CVA = 5- (present on admission) Rate control with metoprolol and anticoagulation with apixaban. Patient on sinus rhythm.   Type 2 diabetes mellitus with hyperlipidemia ([place])- (present on admission) Capillary glucose this am 179.  Positive hyperglycemia with uncontrolled T2DM.  Continue glucose cover and monitoring with insulin sliding scale.  Continue with basal insulin and pre-  meal coverage.   Continue with statin therapy.   Dyslipidemia, goal LDL below 70 Continue with statin therapy.   Class 2 obesity Calculated BMI is 37.2   E-coli UTI Discontinue antibiotic therapy after the 3rd dose. No further urinary  symptoms.   questionable pneumonia on CXR Clinically do not suspect pneumonia, monitor off antibiotics  Longstanding persistent atrial fibrillation (HCC) - In sinus rhythm, continue metoprolol -Restarted Eliquis, heparin discontinued  History of renal transplant On immunosuppressants: imuran  On chronic prednisone, 5mg  daily   COPD (chronic obstructive pulmonary disease) ([place])- (present on admission) No clinical signs of exacerbation Continue with as needed bronchodilator therapy and supplemental 02 per [place]   Anxiety and depression- (present on admission) Continue zoloft and ativan   Deficiency anemia- (present on admission) Hgb and hct have been stable, 8,2 and 26.3   PAD (peripheral artery disease) ([place])- (present on admission) History of occluded bilateral common iliac arteries and common femoral arteries. Patient is high risk for open surgery.  Medical therapy with clopidogrel and statin therapy.   Essential hypertension- (present on admission) On metoprolol for blood pressure control.  Systolic 270 mmHg this am.   Hypokalemia and hypomagnesia - (present on admission) Hyponatremia.  Check renal function, today.  Continue with furosemide for diuresis.         Subjective: Patient is feeling better, with no chest pain, dyspnea has improved, positive nausea this am, and she has not been out of bed.   Physical Exam: Vitals:   09/24/21 2042 09/24/21 2047 09/25/21 0739 09/25/21 0846  BP:   (!) 127/54   Pulse:   66   Resp:      Temp:   98.4 F (36.9 C)   TempSrc:   Oral   SpO2: 96% 96% 93% 95%  Weight:      Height:       Neurology Patient awake and alert ENT mild pallor Cardiovascular, heart with S 1 and S2 present and rhythmic, with no gallops or rubs No JVD  No lower extremity edema Respiratory with no wheezing or rales, on anterior auscultation  Abdomen soft and non tender, protuberant   Data Reviewed:  Reviewed   Family Communication: no family at  the bedside   Disposition: Status is: Inpatient Remains inpatient appropriate because: heart failure management Plan for dc home with home hospice.           Planned Discharge Destination:  home with home hospice       Author: Tawni Millers, MD 09/25/2021 9:51 AM  For on call review [email or web address].

## 2021-09-25 NOTE — Care Management Important Message (Signed)
Important Message  Patient Details  Name: Katherine [name] MRN: [id number] Date of Birth: May 01, 1956   Medicare Important Message Given:  Yes     [name] 09/25/2021, 9:41 AM

## 2021-09-25 NOTE — Progress Notes (Signed)
OT Cancellation Note  Patient Details Name: Nohemy Koop MRN: [id number] DOB: November 20, 1955   Cancelled Treatment:    Reason Eval/Treat Not Completed: OT screened, no needs identified, will sign off.  Spoke to pt, she reports no need for OT at this time.  Plan to dc home with hospice.  She reports having assist as needed at home, all DME.  Will sign off.    Jolaine Artist, OT Acute Rehabilitation Services Pager [phone] Office [phone]   Delight Stare 09/25/2021, 2:36 PM

## 2021-09-25 NOTE — TOC Progression Note (Signed)
Transition of Care [facility]) - Progression Note    Patient Details  Name: Katherine [name] MRN: [id number] Date of Birth: March 02, 1956  Transition [facility]) CM/SW Contact  Graves-Bigelow, Ocie Cornfield, RN Phone Number: 09/25/2021, 1:01 PM  Clinical Narrative:   Case Manager spoke with patient regarding Hospice Services. Patient states she is agreeable to Hospice Service with [place]. Case Manager spoke with the patient regarding DME needs. Patient asked the DME company to deliver the equipment on Friday due to family having appointments today. Patient states she will not have a ride until 1600 Friday. Case Manager did discuss options of the hospital arranging transportation home as well. Case Manager reached out to [facility]  and a Hospice RN will open the case at 1700 Friday 09-26-21. Patient is aware of plan and she states she will try to see if family can arrange to pick her up earlier. Case Manager made MD aware of above situation. Case Manager will continue to follow for additional transition of care needs.   Expected Discharge Plan: Home w Hospice Care Barriers to Discharge: Continued Medical Work up  Expected Discharge Plan and Services Expected Discharge Plan: [place] In-house Referral: Hospice / Palliative Care Discharge Planning Services: CM Consult Post Acute Care Choice: [place] arrangements for the past 2 months: Single Family Home                           HH Arranged: RN, Disease Management, PT, OT, Nurse's Aide [place] Agency: Other - See comment ([place]) Date [facility] Agency Contacted: 09/24/21 Time HH Agency Contacted: 39 Representative spoke with at [place]: Raynelle Dick    Readmission Risk Interventions Readmission Risk Prevention Plan 09/23/2021 09/08/2021 05/14/2021  Transportation Screening Complete Complete Complete  HRI or [address] for [place] - - -  Medication Review Press photographer) Complete Complete Complete  PCP or Specialist appointment within 3-5 days of discharge Complete Complete Complete  HRI or [place] Complete Complete Complete  SW Recovery Care/Counseling Consult Complete Complete Complete  Palliative Care Screening Complete Not Applicable Not [place] Not Applicable Not Applicable Not Applicable  Some recent data might be hidden

## 2021-09-25 NOTE — Assessment & Plan Note (Addendum)
Patient received diuresis with IV furosemide, negative fluid balance was achieved with significant improvement in her symptoms. At her discharge her fluid balance was negative 5,336 ml.   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   Patient will continue diuresis with furosemide and plan to follow as out patient.  Continue with metoprolol, continue to hold on arb for now.

## 2021-09-26 ENCOUNTER — Other Ambulatory Visit (HOSPITAL_COMMUNITY): Payer: Self-pay

## 2021-09-26 LAB — GLUCOSE, CAPILLARY
Glucose-Capillary: 204 mg/dL — ABNORMAL HIGH (ref 70–99)
Glucose-Capillary: 259 mg/dL — ABNORMAL HIGH (ref 70–99)

## 2021-09-26 MED ORDER — POTASSIUM CHLORIDE CRYS ER 20 MEQ PO TBCR
20.0000 meq | EXTENDED_RELEASE_TABLET | Freq: Every day | ORAL | 0 refills | Status: DC
  Filled 2021-09-26: qty 30, 30d supply, fill #0

## 2021-09-26 MED ORDER — PREDNISONE 5 MG PO TABS: 5.0000 mg | ORAL_TABLET | Freq: Every day | ORAL | Status: DC

## 2021-09-26 MED ORDER — POTASSIUM CHLORIDE CRYS ER 20 MEQ PO TBCR: 20.0000 meq | EXTENDED_RELEASE_TABLET | Freq: Every day | ORAL | Status: DC

## 2021-09-26 MED ORDER — FUROSEMIDE 40 MG PO TABS
40.0000 mg | ORAL_TABLET | Freq: Every day | ORAL | 0 refills | Status: DC
Start: 2021-09-27 — End: 2021-10-05
  Filled 2021-09-26: qty 30, 30d supply, fill #0

## 2021-09-26 MED ORDER — IPRATROPIUM-ALBUTEROL 0.5-2.5 (3) MG/3ML IN SOLN: 3.0000 mL | Freq: Four times a day (QID) | RESPIRATORY_TRACT | Status: DC | PRN

## 2021-09-26 MED ORDER — ISOSORBIDE MONONITRATE ER 60 MG PO TB24
60.0000 mg | ORAL_TABLET | Freq: Every day | ORAL | 0 refills | Status: DC
Start: 2021-09-27 — End: 2022-01-14
  Filled 2021-09-26: qty 30, 30d supply, fill #0

## 2021-09-26 NOTE — Plan of Care (Signed)
°  Problem: Clinical Measurements: °Goal: Will remain free from infection °Outcome: Progressing °Goal: Respiratory complications will improve °Outcome: Progressing °Goal: Cardiovascular complication will be avoided °Outcome: Progressing °  °Problem: Activity: °Goal: Risk for activity intolerance will decrease °Outcome: Progressing °  °Problem: Nutrition: °Goal: Adequate nutrition will be maintained °Outcome: Progressing °  °Problem: Coping: °Goal: Level of anxiety will decrease °Outcome: Progressing °  °Problem: Elimination: °Goal: Will not experience complications related to bowel motility °Outcome: Progressing °Goal: Will not experience complications related to urinary retention °Outcome: Progressing °  °Problem: Pain Managment: °Goal: General experience of comfort will improve °Outcome: Progressing °  °Problem: Safety: °Goal: Ability to remain free from injury will improve °Outcome: Progressing °  °Problem: Skin Integrity: °Goal: Risk for impaired skin integrity will decrease °Outcome: Progressing °  °

## 2021-09-26 NOTE — TOC Transition Note (Signed)
Transition of Care [facility]) - CM/SW Discharge Note   Patient Details  Name: Katherine [name] MRN: [id number] Date of Birth: 08-27-1955  Transition of Care [facility]) CM/SW Contact:  Bethena Roys, RN Phone Number: 09/26/2021, 2:06 PM   Clinical Narrative: Durable medical equipment has been delivered for the patient. Niece will provide transportation home at 3:30 and she will bring a portable oxygen tank for travel. [place] will meet the patient in the home at 1700 today. No further needs from Case Manager at this time.    Final next level of care: Home w Hospice Care Barriers to Discharge: Continued Medical Work up   Patient Goals and CMS Choice Patient states their goals for this hospitalization and ongoing recovery are:: Patient is agreeable to Hospice Services in the home. CMS Medicare.gov Compare Post Acute Care list provided to:: Patient Choice offered to / list presented to : Patient   Discharge Plan and Services In-house Referral: Hospice / Palliative Care Discharge Planning Services: CM Consult Post Acute Care Choice: Home Health                    HH Arranged: RN, Disease Management, PT, OT, Nurse's Aide [place] Agency: Other - See comment ([place]) Date [facility] Agency Contacted: 09/24/21 Time HH Agency Contacted: 26 Representative spoke with at [place]: Raynelle Dick    Readmission Risk Interventions Readmission Risk Prevention Plan 09/23/2021 09/08/2021 05/14/2021  Transportation Screening Complete Complete Complete  HRI or [place] for [place] - - -  Medication Review Press photographer) Complete Complete Complete  PCP or Specialist appointment within 3-5 days of discharge Complete Complete Complete  HRI or [place] Complete Complete Complete  SW Recovery Care/Counseling Consult Complete Complete Complete  Palliative Care Screening  Complete Not Applicable Not [place] Not Applicable Not Applicable Not Applicable  Some recent data might be hidden

## 2021-09-26 NOTE — Discharge Summary (Signed)
Physician Discharge Summary   Patient: Katherine [name] MRN: [id number] DOB: [birth year]  Admit date:     09/20/2021  Discharge date: 09/26/21  Discharge Physician: Jimmy Picket Kyon Bentler   PCP: [name], MD   Recommendations at discharge:    Isosorbide has been increased to 60 mg Added furosemide 40 mg daily and 20 meq Kcl supplementation. Continue with prednisone 5 mg per her regular dose.   Discharge Diagnoses: Active Problems:   Acute on chronic respiratory failure with hypoxia (HCC)   NSTEMI (non-ST elevated myocardial infarction) with CAD S/P percutaneous coronary angioplasty - PCI x 5 to SVG-D1   Acute on chronic diastolic CHF (congestive heart failure) (HCC)   Paroxysmal atrial fibrillation ([place]); CHA2DS2VASc score F, HTN, CAD, CVA = 5   Type 2 diabetes mellitus with hyperlipidemia (HCC)   Hypokalemia and hypomagnesia    Essential hypertension   PAD (peripheral artery disease) (HCC)   Deficiency anemia   Anxiety and depression   COPD (chronic obstructive pulmonary disease) ([place])   History of renal transplant   E-coli UTI   Class 2 obesity  Resolved Problems:   * No resolved hospital problems. [facility] Course: Katherine [name] was admitted to the hospital with the working diagnosis of decompensated heart failure, complicated with non st elevation myocardial infarction.   66 yo female with the past medical history of CAD sp CABG, carotid artery stenosis, COPD on home 02, T2DM, atrial fibrillation, depression, fibromyalgia, anemia and ESRD sp renal transplant who presented with dyspnea and chest pain. Patient had a recent hospitalization on 07/2021 to 08/25/2021 for COVID 19 viral pneumonia complicated with MRSA pneumonia.  01/17 to 09/11/2021 for NSTEMI with unsuccessful PCI complicated with hematoma.  At home she developed worsening dyspnea, initially on exertion and then at rest, reported orthopnea. Positive chest pain radiated to her jaw, with nausea. She called  EMS and she was transported to the ED. On her initial physical examination her blood pressure was 113/73, HR 112, RR 20 and oxygenation 99 on 6 L/min per HFNC. Patient had diffuse rales bilaterally, no wheezing, heart with S1 and S2 present regular rhythm, abdomen soft and positive lower extremity edema.   Na 135, K 3,2, CL 96, bicarbonate 25, glucose 216, bun 16 and cr at 0,89 BNP 423 High sensitive troponin [phone] Wbc 14.8, hgb 9,2 and hct 29,4, plt 409  SARS COVID 19 negative   UA with specific gravity 1.015, wbc >50 and positive nitrates.   Chest radiograph with bilateral interstitial infiltrates, predominantly at lower lobes.   EKG 97 bpm, left axis deviation, left bundle branch block, sinus rhythm, with no significant ST segment or T wave changes.   Patient was placed on furosemide for diuresis and heparin for anticoagulation.  Improvement in volume status and oxygenation.  02/07 follow up chest radiograph with improvement in infiltrates.   Patient will continue with medical therapy for her coronary artery disease and heart failure. 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   She has decided to go home with hospice services.    Assessment and Plan: Acute on chronic respiratory failure with hypoxia ([place])- (present on admission) Patient was admitted to the cardiac unit and was placed on supplemental 02 per  with good toleration.   Patient had recent COVID 19 viral pneumonia and suspected lung injury.   Responded well to diuresis, at discharge her oxygenation is 98 on 4 l/min  per [place].  She has home 02 arranged.    Acute on chronic diastolic CHF (congestive heart failure) ([place])- (present on admission) Patient received diuresis with IV furosemide, negative fluid balance was achieved with significant improvement in her symptoms. At her discharge her fluid balance was negative 5,336 ml.   Follow up  echocardiogram with LV EF 55 to 60%, positive apical septal segment and apex are aneurysm post MI.   Patient will continue diuresis with furosemide and plan to follow as out patient.  Continue with metoprolol, continue to hold on arb for now.    NSTEMI (non-ST elevated myocardial infarction) with CAD S/P percutaneous coronary angioplasty - PCI x 5 to SVG-D1- (present on admission) Patient did received heparin IV for anticoagulation  Her chest pain improved, echocardiogram with apical pseudoaneurysm at the apical septal segment and apex.   She had 2 recent unsuccessful left heart cath attempts on 1/5 and 1/20.  Recommendations to continue with medical therapy with clopidogrel, isosorbide (increased dose to 60 mg) and ranolazine.  Add low dose statin therapy.    Paroxysmal atrial fibrillation ([place]); CHA2DS2VASc score F, HTN, CAD, CVA = 5- (present on admission) Patient was placed on a telemetry monitor, rate controlled with metoprolol and anticoagulation with apixaban.   Type 2 diabetes mellitus with hyperlipidemia ([place])- (present on admission) Positive hyperglycemia with uncontrolled T2DM.  Steroid induced hyperglycemia.  Patient was placed on basal insulin, pre- meal short acting insulin and insulin sliding scale for glucose cover and monitoring.   At her discharge patient will continue with glimepride, metformin and basal insulin.   Statin therapy.   Dyslipidemia, goal LDL below 70 Continue with statin therapy.   Class 2 obesity Calculated BMI is 37.2   E-coli UTI Patient received 3 days of antibiotic therapy with good toleration.   questionable pneumonia on CXR Clinically do not suspect pneumonia, monitor off antibiotics  Longstanding persistent atrial fibrillation (HCC) - In sinus rhythm, continue metoprolol -Restarted Eliquis, heparin discontinued  History of renal transplant Patient will continue with  immunosuppressants: imuran  On chronic prednisone, 5mg  daily    COPD (chronic obstructive pulmonary disease) ([place])- (present on admission) No signs of clinical exacerbation Continue with as needed bronchodilator therapy and supplemental 02 per [place]   Anxiety and depression- (present on admission) Patient is on sertraline and lorazepam.   Deficiency anemia- (present on admission) Cell count remained stable, plan to follow up cell count as outpatient.  At her discharge her hgb was 8,2 with hct 26.3   PAD (peripheral artery disease) ([place])- (present on admission) History of occluded bilateral common iliac arteries and common femoral arteries. Patient is high risk for open surgery.  Patient will continue with clopidogrel and statin therapy. .   Essential hypertension- (present on admission) Continue blood pressure control with metoprolol and diuresis with furosemide. Her blood pressure remained well controlled.   Hypokalemia and hypomagnesia - (present on admission) Hyponatremia.  Patient responded well to diuresis, her electrolytes were corrected.  At her discharge her renal function showed a serum cr of 0,93 with K at 4.0 and serum bicarbonate at 33. Plan to continue with furosemide for diuresis with K supplementation.  Follow renal function and electrolytes as outpatient.            Consultants: cardiology  Procedures performed: none   Disposition: Home with hospice  Diet recommendation:  Discharge Diet Orders (From admission, onward)     Start     Ordered   09/26/21 0000  Diet -  low sodium heart healthy        09/26/21 0927           Cardiac diet  DISCHARGE MEDICATION: Allergies as of 09/26/2021       Reactions   Tetracycline Hives   Patient tolerated Doxycycline Dec 2020   Niacin Other (See Comments)   Mouth blisters   Niaspan [niacin Er] Other (See Comments)   Mouth blisters   Sulfa Antibiotics Nausea Only, Other (See Comments)   "Tears up stomach"   Sulfonamide Derivatives Other (See Comments)   Reaction: per  patient "tears her stomach up"   Codeine Nausea And Vomiting   Erythromycin Nausea And Vomiting   Hydromorphone Hcl Nausea And Vomiting   Morphine And Related Nausea And Vomiting   Nalbuphine Nausea And Vomiting   Nubain   Sulfasalazine Nausea Only, Other (See Comments)   per patient "tears her stomach up", "Tears up stomach"   Tape Rash, Other (See Comments)   No "plastic" tape," please----cloth tape only        Medication List     STOP taking these medications    (feeding supplement) PROSource Plus liquid   acetaminophen 500 MG tablet Commonly known as: TYLENOL   Ensure Max Protein Liqd   hydrocortisone 25 MG suppository Commonly known as: ANUSOL-HC   nutrition supplement (JUVEN) Pack   nystatin powder Commonly known as: MYCOSTATIN/NYSTOP   polyethylene glycol 17 g packet Commonly known as: MIRALAX / GLYCOLAX   senna-docusate 8.6-50 MG tablet Commonly known as: Senokot-S       TAKE these medications    albuterol 108 (90 Base) MCG/ACT inhaler Commonly known as: VENTOLIN HFA TAKE 2 PUFFS BY MOUTH EVERY 6 HOURS AS NEEDED FOR WHEEZE OR SHORTNESS OF BREATH What changed: See the new instructions.   apixaban 5 MG Tabs tablet Commonly known as: Eliquis Take 1 tablet (5 mg total) by mouth 2 (two) times daily. APPOINTMENT NEEDED WITH CARDIOLOGIST FOR FURTHER REFILLS What changed: additional instructions   arformoterol 15 MCG/2ML Nebu Commonly known as: BROVANA Take 2 mLs (15 mcg total) by nebulization 2 (two) times daily.   ascorbic acid 500 MG tablet Commonly known as: VITAMIN C Take 1 tablet (500 mg total) by mouth daily.   azaTHIOprine 50 MG tablet Commonly known as: IMURAN Take 125 mg by mouth See admin instructions. Take 2 1/2 tablets (125 mg) by mouth daily at 3pm   Basaglar KwikPen 100 UNIT/ML Inject 20 Units into the skin daily before breakfast. What changed: how much to take   budesonide 0.5 MG/2ML nebulizer solution Commonly known as:  PULMICORT Take 2 mLs (0.5 mg total) by nebulization 2 (two) times daily.   calcitRIOL 0.25 MCG capsule Commonly known as: ROCALTROL Take 0.25 mcg by mouth every 3 (three) days.   cetirizine 5 MG tablet Commonly known as: ZYRTEC Take 5 mg by mouth daily.   clopidogrel 75 MG tablet Commonly known as: PLAVIX Take 1 tablet (75 mg total) by mouth daily.   cyclobenzaprine 10 MG tablet Commonly known as: FLEXERIL Take 1 tablet (10 mg total) by mouth 3 (three) times daily as needed for muscle spasms.   fluticasone 50 MCG/ACT nasal spray Commonly known as: FLONASE PLACE 2 SPRAYS INTO BOTH NOSTRILS 2 TIMES DAILY. What changed: See the new instructions.   furosemide 40 MG tablet Commonly known as: LASIX Take 1 tablet (40 mg total) by mouth daily. Start taking on: September 27, 2021   gabapentin 100 MG capsule Commonly known as: NEURONTIN Take  1 capsule (100 mg total) by mouth at bedtime.   glimepiride 4 MG tablet Commonly known as: AMARYL Take 4 mg by mouth daily.   isosorbide mononitrate 60 MG 24 hr tablet Commonly known as: IMDUR Take 1 tablet (60 mg total) by mouth daily. Start taking on: September 27, 2021 What changed:  medication strength how much to take   lamoTRIgine 25 MG tablet Commonly known as: LAMICTAL TAKE 1 TABLET BY MOUTH EVERYDAY AT BEDTIME   linaclotide 145 MCG Caps capsule Commonly known as: LINZESS Take 1 capsule (145 mcg total) by mouth daily before breakfast.   LORazepam 1 MG tablet Commonly known as: ATIVAN Take 0.5 tablets (0.5 mg total) by mouth 2 (two) times daily as needed for anxiety.   metFORMIN 500 MG tablet Commonly known as: GLUCOPHAGE Take 1 tablet (500 mg total) by mouth 2 (two) times daily.   metoprolol tartrate 25 MG tablet Commonly known as: LOPRESSOR Take 1 tablet (25 mg total) by mouth 3 (three) times daily.   montelukast 10 MG tablet Commonly known as: SINGULAIR TAKE 1 TABLET BY MOUTH EVERYDAY AT BEDTIME What changed: See  the new instructions.   multivitamin with minerals Tabs tablet Take 1 tablet by mouth daily.   Nebulizer Devi Pt needs nebulizr machine dx J44.9   nitroGLYCERIN 0.4 MG SL tablet Commonly known as: NITROSTAT Place 1 tablet (0.4 mg total) under the tongue every 5 (five) minutes as needed for chest pain.   NovoLOG FlexPen 100 UNIT/ML FlexPen Generic drug: insulin aspart Inject 4-10 Units into the skin 3 (three) times daily with meals. Per sliding scale :not provided   Oxycodone HCl 10 MG Tabs Take 1 tablet (10 mg total) by mouth 4 (four) times daily as needed. What changed: reasons to take this   OXYGEN Inhale 4 L into the lungs continuous.   pantoprazole 40 MG tablet Commonly known as: PROTONIX TAKE 1 TABLET BY MOUTH EVERY DAY What changed: how to take this   potassium chloride SA 20 MEQ tablet Commonly known as: KLOR-CON M Take 1 tablet (20 mEq total) by mouth daily. Start taking on: September 27, 2021   predniSONE 5 MG tablet Commonly known as: DELTASONE Take 1 tablet (5 mg total) by mouth daily with breakfast. Start taking on: September 27, 2021 What changed:  medication strength how much to take how to take this when to take this additional instructions   promethazine 25 MG tablet Commonly known as: PHENERGAN Take 25 mg by mouth every 6 (six) hours as needed for nausea or vomiting.   ranolazine 500 MG 12 hr tablet Commonly known as: Ranexa Take 1 tablet (500 mg total) by mouth 2 (two) times daily.   rosuvastatin 20 MG tablet Commonly known as: CRESTOR Take 1 tablet (20 mg total) by mouth daily.   sertraline 100 MG tablet Commonly known as: ZOLOFT Take 1 tablet (100 mg total) by mouth daily.   zinc sulfate 220 (50 Zn) MG capsule Take 1 capsule (220 mg total) by mouth daily.        Follow-up Information     [facility] Follow up.   Specialty: Hospice and Palliative Medicine Why: Palliative Services-office to call the patient with visit  times. Contact information: [place] [address] [phone]         [name], MD Follow up.   Specialty: Cardiology Why: Please keep follow-up visit with Dr. Ellyn Hack on 10/03/2021 at 10:00am which you already have scheduled. Contact information: [address] [place] [address] [phone]                 Discharge Exam: Filed Weights   09/22/21 0517 09/23/21 0626 09/24/21 0556  Weight: 92.9 kg 94.6 kg 89.4 kg   Neurology awake and alert ENT no pallor Cardiovascular. Heart with S1 and S2 present and rhythmic with no gallops, no rubs or murmurs No jvd No lower extremity edema Respiratory with no wheezing or rhonchi, no rales Abdomen soft and non tender  Condition at discharge: stable  The results of significant diagnostics from this hospitalization (including imaging, microbiology, ancillary and laboratory) are listed below for reference.   Imaging Studies: DG Chest 2 View  Result Date: 09/20/2021 CLINICAL DATA:  Difficulty breathing EXAM: CHEST - 2 VIEW COMPARISON:  Previous studies including the examination of 09/07/2021 FINDINGS: Transverse diameter of heart is increased. There is evidence of previous coronary bypass surgery. Central pulmonary vessels are prominent. There is poor inspiration. Increased interstitial markings are seen in both lungs. There is minimal blunting of lateral CP angles. There is no pneumothorax. IMPRESSION: Cardiomegaly. Diffuse increase in interstitial markings in both lungs may suggest pulmonary edema or interstitial pneumonia. Part of this finding may suggest underlying scarring. Possible small bilateral pleural effusions. Electronically Signed   By: Elmer Picker M.D.   On: 09/20/2021 11:48   DG Chest 2 View  Result Date: 08/29/2021 CLINICAL DATA:  Shortness of breath EXAM: CHEST - 2 VIEW COMPARISON:  08/14/2021 FINDINGS: Cardiac shadow remains enlarged. Postsurgical changes are  noted. Increased vascular congestion and mild pulmonary edema right greater than left is noted. Left-sided PICC line is again identified likely in the left SVC. No pneumothorax or sizable effusion is seen. No bony abnormality is noted. IMPRESSION: CHF with parenchymal edema worse on the right than the left. These results will be called to the ordering clinician or representative by the Radiologist Assistant, and communication documented in the PACS or [employer]. Electronically Signed   By: Inez Catalina M.D.   On: 08/29/2021 22:11   DG Knee 1-2 Views Right  Result Date: 09/01/2021 CLINICAL DATA:  Hervey Ard right knee pain, no injury. EXAM: RIGHT KNEE - 1-2 VIEW COMPARISON:  None. FINDINGS: Lateral view is slightly rotated. No definite joint effusion. No fracture. Chondrocalcinosis in the lateral compartment. Scattered surgical clips in the soft tissues. IMPRESSION: 1. No acute findings. 2. Chondrocalcinosis in the lateral compartment of the knee. Electronically Signed   By: Lorin Picket M.D.   On: 09/01/2021 11:41   MR LUMBAR SPINE WO CONTRAST  Result Date: 08/29/2021 CLINICAL DATA:  Initial evaluation for low back pain with right lower extremity pain. Immunocompromised. EXAM: MRI LUMBAR SPINE WITHOUT CONTRAST TECHNIQUE: Multiplanar, multisequence MR imaging of the lumbar spine was performed. No intravenous contrast was administered. COMPARISON:  Prior MRI from 08/25/2017. FINDINGS: Segmentation: Standard. Lowest well-formed disc space labeled the L5-S1 level. Alignment: 4 mm anterolisthesis of L4 on L5, chronic and facet mediated, and mildly progressed from prior. Trace retrolisthesis of T11 on T12, stable. Alignment otherwise normal with preservation of the normal lumbar lordosis. Vertebrae: Vertebral body height maintained without acute or chronic fracture. Bone marrow signal intensity within normal limits. No discrete or worrisome osseous lesions or abnormal marrow edema. Conus medullaris and cauda  equina: Conus extends to the L1-2 level. Conus and cauda equina appear normal. Paraspinal and other soft tissues: Paraspinous soft tissues demonstrate no acute finding. The visualized native kidneys are markedly atrophic. Multifocal atheromatous irregularity seen  throughout the intra-abdominal aorta without visible aneurysm. Disc levels: T10-11: Seen only on sagittal projection. Intervertebral disc space narrowing with disc desiccation and mild disc bulge. Bilateral facet hypertrophy. No significant spinal stenosis. Foramina appear grossly patent. T11-12: Intervertebral disc space narrowing with mild disc bulge and disc desiccation. No significant stenosis. T12-L1: Unremarkable. L1-2: Disc desiccation without significant disc bulge. No canal or foraminal stenosis. L2-3: Disc desiccation without significant disc bulge. No canal or foraminal stenosis. L3-4: Mild intervertebral disc space narrowing with diffuse disc bulge. Superimposed broad-based left foraminal to extraforaminal disc protrusion contacts the exiting left L3 nerve root (series 6, image 24). Associated annular fissure. Mild bilateral facet hypertrophy. No significant spinal stenosis. Mild left L3 foraminal narrowing. Right neural foramina remains patent. L4-5: 4 mm anterolisthesis. Mild disc bulge with disc desiccation. Superimposed left foraminal to extraforaminal disc protrusion contacts the exiting left L4 nerve root (series 3, image 12). Advanced bilateral facet arthrosis, progressed from previous. No significant canal or lateral recess stenosis. Mild left greater than right L4 foraminal narrowing. L5-S1: Broad-based left eccentric disc bulge closely approximates and/or contacts the exiting left L5 nerve root (series 6, image 35). Moderate bilateral facet arthrosis. No significant spinal stenosis. Mild left L5 foraminal narrowing without impingement. Right foramen remains patent. IMPRESSION: 1. Broad-based left foraminal to extraforaminal disc  protrusions at L3-4 and L4-5, contacting and potentially irritating the exiting left L3 and L4 nerve roots respectively. 2. Broad-based left eccentric disc bulge at L5-S1, closely approximating and/or potentially irritating the exiting left L5 nerve root. 3. Advanced bilateral facet arthrosis at L4-5 with associated 4 mm anterolisthesis, mildly progressed as compared to 2019. 4. No other acute abnormality within the lumbar spine. No findings to explain patient's right lower extremity symptoms identified. Aortic Atherosclerosis (ICD10-I70.0). Electronically Signed   By: Jeannine Boga M.D.   On: 08/29/2021 03:39   CARDIAC CATHETERIZATION  Result Date: 09/05/2021 Unsuccessful attempt at Left Heart Catheterization with Coronary and GraftAngiography via radial access.  Extensive radial artery spasm, unable to pass catheter beyond elbow. With wire removal, there was evidence of forearm hematoma being held with manual pressure. Only other potential option for angiography would be to consider ulnar access on the right arm versus brachial arterial access at a later date. [name], MD  DG CHEST PORT 1 VIEW  Result Date: 09/23/2021 CLINICAL DATA:  Shortness of breath EXAM: PORTABLE CHEST 1 VIEW COMPARISON:  Previous studies including the examination of 09/20/2021 FINDINGS: Transverse diameter of heart is increased. There is interval decrease in pulmonary vascular congestion. There is interval decrease in interstitial markings in both lungs and improvement in aeration of both lungs. There is residual prominence of interstitial markings in the right parahilar region and both lower lung fields. There is no focal pulmonary consolidation. There is previous coronary bypass surgery. There is blunting of both lateral CP angles suggesting small effusions. There is no pneumothorax. IMPRESSION: Cardiomegaly. There is interval decrease in pulmonary vascular congestion and pulmonary edema. There is residual prominence of  interstitial markings in the right parahilar region and both lower lung fields suggesting residual pulmonary edema or interstitial pneumonia. No new focal infiltrates are seen. Electronically Signed   By: Elmer Picker M.D.   On: 09/23/2021 13:13   DG CHEST PORT 1 VIEW  Result Date: 09/07/2021 CLINICAL DATA:  Shortness of breath EXAM: PORTABLE CHEST 1 VIEW COMPARISON:  09/02/2021 FINDINGS: No significant change in rotated AP portable examination, with cardiomegaly status post median sternotomy and CABG. Unchanged, diffuse bilateral interstitial  opacity. Left upper extremity PICC. IMPRESSION: No significant change in rotated AP portable examination, with cardiomegaly status post median sternotomy and CABG. Unchanged, diffuse bilateral interstitial opacity, consistent with edema or infection. Electronically Signed   By: Delanna Ahmadi M.D.   On: 09/07/2021 14:09   DG CHEST PORT 1 VIEW  Result Date: 09/02/2021 CLINICAL DATA:  Short of breath EXAM: PORTABLE CHEST 1 VIEW COMPARISON:  None. FINDINGS: Sternotomy wires overlie normal cardiac silhouette. Patient rotated leftward. Diffuse patchy airspace opacities are similar comparison exam. No pleural fluid. No pneumothorax. IMPRESSION: No change in diffuse patchy bilateral airspace disease consistent with multifocal pneumonia versus pulmonary edema. Electronically Signed   By: Suzy Bouchard M.D.   On: 09/02/2021 09:25   VAS [place] ABI WITH/WO TBI  Result Date: 08/30/2021  LOWER EXTREMITY DOPPLER STUDY Patient Name:  Katherine [name]  Date of Exam:   08/30/2021 Medical Rec #: [id number]           Accession #:    [id number] Date of Birth: 05-Jan-1956           Patient Gender: F Patient Age:   41 years Exam Location:  [facility] Procedure:      VAS [place] ABI WITH/WO TBI Referring Phys: Alysia Penna --------------------------------------------------------------------------------  Indications: Rest pain, specifically around the shin area. Pperipheral  artery              disease, (cath attempted 08/18/2021- Bilateral iliofemoral              obstruction) preventing catheterization. 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 High Risk         Hypertension, hyperlipidemia, Diabetes, coronary artery Factors:          disease. Other Factors: Recent Covid pneumonia, NSTEMI, history of renal transplant. CABG                1994.  Vascular Interventions: No prior. Comparison Study: Prior LEA done 08/28/13 indicating possible inflow disease on                   the right suggested by common femoral waveforms and severe                   bilateral common femoral artery stenosis. Performing Technologist: Sharion Dove RVS  Examination Guidelines: A complete evaluation includes at minimum, Doppler waveform signals and systolic blood pressure reading at the level of bilateral brachial, anterior tibial, and posterior tibial arteries, when vessel segments are accessible. Bilateral testing is considered an integral part of a complete examination. Photoelectric Plethysmograph (PPG) waveforms and toe systolic pressure readings are included as required and additional duplex testing as needed. Limited examinations for reoccurring indications may be performed as noted.  ABI Findings: +---------+------------------+-----+-------------------+--------+  Right     Rt Pressure (mmHg) Index Waveform            Comment   +---------+------------------+-----+-------------------+--------+  Brachial  115                      multiphasic                   +---------+------------------+-----+-------------------+--------+  PTA       87  0.76  dampened monophasic           +---------+------------------+-----+-------------------+--------+  DP        22                 0.19  dampened monophasic           +---------+------------------+-----+-------------------+--------+  Great Toe 31                  0.27                                +---------+------------------+-----+-------------------+--------+ +---------+------------------+-----+----------+--------------------+  Left      Lt Pressure (mmHg) Index Waveform   Comment               +---------+------------------+-----+----------+--------------------+  Brachial                                      restricted (fistula)  +---------+------------------+-----+----------+--------------------+  PTA       254                2.21  monophasic                       +---------+------------------+-----+----------+--------------------+  DP        254                2.21  monophasic                       +---------+------------------+-----+----------+--------------------+  Great Toe 73                 0.63                                   +---------+------------------+-----+----------+--------------------+ +-------+----------------+-----------+------------+------------+  ABI/TBI Today's ABI      Today's TBI Previous ABI Previous TBI  +-------+----------------+-----------+------------+------------+  Right   0.76             0.27        0.96         0.70          +-------+----------------+-----------+------------+------------+  Left    non compressible 0.63        1.04         0.71          +-------+----------------+-----------+------------+------------+ Right ABIs appear decreased compared to prior study on 08/28/13. Bilateral TBIs appear decreased compared to prior study on 08/28/13. Left ABI is now non compressible since study done 08/28/13  Summary: Right: Resting right ankle-brachial index indicates moderate right lower extremity arterial disease. The right toe-brachial index is abnormal. ABI indicates a moderate decrease in flow by velocity, however, significantly dampened waveforms indicates a more severe level of disease. Left: Resting left ankle-brachial index indicates noncompressible left lower extremity arteries. The left toe-brachial index is abnormal.   *See table(s) above for measurements and observations.  Electronically signed by Jamelle Haring on 08/30/2021 at 3:15:06 PM.    Final    VAS US CAROTID  Result Date: 09/02/2021 Carotid Arterial Duplex Study Patient Name:  Katherine [name]  Date of Exam:   09/02/2021 Medical Rec #: [id number]           Accession #:    [id number] Date of Birth:  April 25, 1956           Patient Gender: F Patient Age:   72 years Exam Location:  [facility] Procedure:      VAS US CAROTID Referring Phys: Jenkins Rouge --------------------------------------------------------------------------------  Risk Factors:      Hypertension, hyperlipidemia, Diabetes, current smoker,                    coronary artery disease. Limitations        Today's exam was limited due to the body habitus of the                    patient and patient position. Comparison Study:  No prior study Performing Technologist: Maudry Mayhew MHA, RDMS, RVT, RDCS  Examination Guidelines: A complete evaluation includes B-mode imaging, spectral Doppler, color Doppler, and power Doppler as needed of all accessible portions of each vessel. Bilateral testing is considered an integral part of a complete examination. Limited examinations for reoccurring indications may be performed as noted.  Right Carotid Findings: +----------+--------+--------+--------+-------------------------+---------+             PSV cm/s EDV cm/s Stenosis Plaque Description        Comments   +----------+--------+--------+--------+-------------------------+---------+  CCA Prox   52       20                                                     +----------+--------+--------+--------+-------------------------+---------+  CCA Distal 55       12                calcific                  Shadowing  +----------+--------+--------+--------+-------------------------+---------+  ICA Prox   193      57       40-59%   heterogenous and calcific Shadowing   +----------+--------+--------+--------+-------------------------+---------+  ICA Distal 75       17                                                     +----------+--------+--------+--------+-------------------------+---------+  ECA        93       5                                                      +----------+--------+--------+--------+-------------------------+---------+ +----------+--------+-------+--------------+-------------------+             PSV cm/s EDV cms Describe       Arm Pressure (mmHG)  +----------+--------+-------+--------------+-------------------+  Subclavian                  Not identified                      +----------+--------+-------+--------------+-------------------+ +---------+--------+--------+--------------+  Vertebral PSV cm/s EDV cm/s Not identified  +---------+--------+--------+--------------+  Left Carotid Findings: +----------+--------+--------+--------+------------------------------+---------+             PSV cm/s EDV cm/s Stenosis Plaque Description  Comments   +----------+--------+--------+--------+------------------------------+---------+  CCA Prox   95       19                                                          +----------+--------+--------+--------+------------------------------+---------+  CCA Distal 77       24                                                          +----------+--------+--------+--------+------------------------------+---------+  ICA Prox   151      41       40-59%   heterogenous, irregular and    Shadowing                                         calcific                                  +----------+--------+--------+--------+------------------------------+---------+  ICA Distal 137      38                                                          +----------+--------+--------+--------+------------------------------+---------+  ECA        140      34                heterogenous and calcific      shadowing   +----------+--------+--------+--------+------------------------------+---------+ +----------+--------+--------+--------------+-------------------+             PSV cm/s EDV cm/s Describe       Arm Pressure (mmHG)  +----------+--------+--------+--------------+-------------------+  Subclavian                   Not identified                      +----------+--------+--------+--------------+-------------------+ +---------+--------+--+--------+--+---------+  Vertebral PSV cm/s 56 EDV cm/s 20 Antegrade  +---------+--------+--+--------+--+---------+   Summary: Right Carotid: Velocities in the right ICA are consistent with a 40-59%                stenosis. Left Carotid: Velocities in the left ICA are consistent with a 40-59% stenosis. Vertebrals:  Left vertebral artery demonstrates antegrade flow. Right vertebral              artery was not visualized. 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 *See table(s) above for measurements and observations.  Electronically signed by Deitra Mayo MD on 09/02/2021 at 3:24:09 PM.    Final    VAS [place] LOWER EXTREMITY ARTERIAL DUPLEX  Result Date: 08/30/2021 LOWER EXTREMITY ARTERIAL DUPLEX STUDY Patient Name:  Katherine [name]  Date of Exam:   08/30/2021 Medical Rec #: [id number]           Accession #:    [id number] Date of Birth: May 14, 1956  Patient Gender: F Patient Age:   81 years Exam Location:  [facility] Procedure:      VAS [place] LOWER EXTREMITY ARTERIAL DUPLEX Referring Phys: Alysia Penna --------------------------------------------------------------------------------  Indications: Rest pain,specifically at shin area. 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 High Risk Factors: Hypertension, hyperlipidemia, Diabetes, prior MI, coronary                     artery disease. Other Factors: Kidney transplant, CABG, recent Covid pneumonia.  Vascular Interventions: No prior intervention. Current ABI:            R:0.76 L:non compressible Comparison Study: Prior LEA done 08/28/13 indicating possible inflow disease on                   the right suggested by right CFA waveforms. Severe bilateral                   CFA stenosis. Performing Technologist: Sharion Dove RVS  Examination Guidelines: A complete evaluation includes B-mode imaging, spectral Doppler, color Doppler, and power Doppler as needed of all accessible portions of each vessel. Bilateral testing is considered an integral part of a complete examination. Limited examinations for reoccurring indications may be performed as noted.  +-----------+--------+-----+--------+----------+---------------------+  RIGHT       PSV cm/s Ratio Stenosis Waveform   Comments               +-----------+--------+-----+--------+----------+---------------------+  CFA Prox    52                      monophasic                        +-----------+--------+-----+--------+----------+---------------------+  DFA         22                      monophasic                        +-----------+--------+-----+--------+----------+---------------------+  SFA Prox    43                      monophasic collateral flow noted  +-----------+--------+-----+--------+----------+---------------------+  SFA Mid     35                      monophasic                        +-----------+--------+-----+--------+----------+---------------------+  SFA Distal  32                      monophasic                        +-----------+--------+-----+--------+----------+---------------------+  POP Prox    22                      monophasic                        +-----------+--------+-----+--------+----------+---------------------+  POP Distal  32                      monophasic                         +-----------+--------+-----+--------+----------+---------------------+  ATA Prox    21                      monophasic                        +-----------+--------+-----+--------+----------+---------------------+  ATA Mid     25                      monophasic                        +-----------+--------+-----+--------+----------+---------------------+  ATA Distal  30                      monophasic                        +-----------+--------+-----+--------+----------+---------------------+  PTA Prox    39                      monophasic                        +-----------+--------+-----+--------+----------+---------------------+  PTA Mid     39                      monophasic                        +-----------+--------+-----+--------+----------+---------------------+  PTA Distal  53                      monophasic                        +-----------+--------+-----+--------+----------+---------------------+  PERO Prox                           absent                            +-----------+--------+-----+--------+----------+---------------------+  PERO Mid                            absent                            +-----------+--------+-----+--------+----------+---------------------+  PERO Distal                         absent                            +-----------+--------+-----+--------+----------+---------------------+   +----------+--------+-----+--------+----------+--------+  LEFT       PSV cm/s Ratio Stenosis Waveform   Comments  +----------+--------+-----+--------+----------+--------+  CFA Prox   52                      monophasic           +----------+--------+-----+--------+----------+--------+  DFA        45                      monophasic           +----------+--------+-----+--------+----------+--------+  SFA Prox   57                      monophasic           +----------+--------+-----+--------+----------+--------+  SFA Mid    46                      monophasic            +----------+--------+-----+--------+----------+--------+  SFA Distal 32                      monophasic           +----------+--------+-----+--------+----------+--------+  POP Prox   31                      monophasic           +----------+--------+-----+--------+----------+--------+  POP Distal 24                      monophasic           +----------+--------+-----+--------+----------+--------+  ATA Prox   21                      monophasic           +----------+--------+-----+--------+----------+--------+  ATA Mid    25                      monophasic           +----------+--------+-----+--------+----------+--------+  ATA Distal 14                      monophasic           +----------+--------+-----+--------+----------+--------+  PTA Prox   30                      monophasic           +----------+--------+-----+--------+----------+--------+  PTA Mid    28                      monophasic           +----------+--------+-----+--------+----------+--------+  PTA Distal 25                      monophasic           +----------+--------+-----+--------+----------+--------+  Summary: Right: Atherosclerotic plaque noted throughout. Monophasic waveforms suggestive of iliofemoral disease. Some collateral flow noted at the mid SFA. Left: Atherosclerotic plaque noted throughout. Monophasic waveforms suggestive of iliofemoral disease.  See table(s) above for measurements and observations. Electronically signed by Jamelle Haring on 08/30/2021 at 3:16:10 PM.    Final    VAS [place] LOWER EXTREMITY VENOUS (DVT)  Result Date: 08/28/2021  Lower Venous DVT Study Patient Name:  Katherine [name]  Date of Exam:   08/28/2021 Medical Rec #: [id number]           Accession #:    [id number] Date of Birth: May 13, 1956           Patient Gender: F Patient Age:   49 years Exam [facility] Procedure:      VAS [place] LOWER EXTREMITY VENOUS (DVT) Referring Phys: Alysia Penna  --------------------------------------------------------------------------------  Indications: Pain.  Limitations: Body habitus and poor ultrasound/tissue interface. Comparison Study: 08/09/2021- negative bilateral lower extremity venous duplex Performing Technologist: Maudry Mayhew MHA, RDMS, RVT, RDCS  Examination Guidelines: A complete evaluation includes B-mode imaging, spectral Doppler, color Doppler, and power Doppler as needed of all accessible portions of each vessel. Bilateral testing is considered an integral part of a complete examination. Limited examinations for reoccurring indications may be performed as noted. The reflux portion of the exam is performed with the patient in reverse Trendelenburg.  +---------+---------------+---------+-----------+----------+--------------+  RIGHT     Compressibility Phasicity Spontaneity Properties Thrombus Aging  +---------+---------------+---------+-----------+----------+--------------+  CFV       Full            Yes       Yes                                    +---------+---------------+---------+-----------+----------+--------------+  SFJ       Full                                                             +---------+---------------+---------+-----------+----------+--------------+  FV Prox   Full                                                             +---------+---------------+---------+-----------+----------+--------------+  FV Mid    Full                                                             +---------+---------------+---------+-----------+----------+--------------+  FV Distal Full                                                             +---------+---------------+---------+-----------+----------+--------------+  PFV       Full                                                             +---------+---------------+---------+-----------+----------+--------------+  POP       Full            Yes       Yes                                     +---------+---------------+---------+-----------+----------+--------------+  PTV  Full                                                             +---------+---------------+---------+-----------+----------+--------------+  PERO      Full                                                             +---------+---------------+---------+-----------+----------+--------------+   Right Technical Findings: Not visualized segments include limited visualization PTV and peroneal veins.  +---------+---------------+---------+-----------+----------+--------------+  LEFT      Compressibility Phasicity Spontaneity Properties Thrombus Aging  +---------+---------------+---------+-----------+----------+--------------+  CFV       Full            Yes       Yes                                    +---------+---------------+---------+-----------+----------+--------------+  SFJ       Full                                                             +---------+---------------+---------+-----------+----------+--------------+  FV Prox   Full                                                             +---------+---------------+---------+-----------+----------+--------------+  FV Mid    Full                                                             +---------+---------------+---------+-----------+----------+--------------+  FV Distal Full                                                             +---------+---------------+---------+-----------+----------+--------------+  PFV       Full                                                             +---------+---------------+---------+-----------+----------+--------------+  POP       Full            Yes       Yes                                    +---------+---------------+---------+-----------+----------+--------------+  PTV       Full                                                             +---------+---------------+---------+-----------+----------+--------------+  PERO      Full                                                              +---------+---------------+---------+-----------+----------+--------------+   Left Technical Findings: Not visualized segments include limited visualization PTV and peroneal veins.   Summary: RIGHT: - There is no evidence of deep vein thrombosis in the lower extremity. However, portions of this examination were limited- see technologist comments above.  - No cystic structure found in the popliteal fossa.  LEFT: - There is no evidence of deep vein thrombosis in the lower extremity. However, portions of this examination were limited- see technologist comments above.  - No cystic structure found in the popliteal fossa.  *See table(s) above for measurements and observations. Electronically signed by Orlie Pollen on 08/28/2021 at 3:58:46 PM.    Final    ECHOCARDIOGRAM LIMITED  Result Date: 09/22/2021    ECHOCARDIOGRAM LIMITED REPORT   Patient Name:   Katherine [name] Date of Exam: 09/21/2021 Medical Rec #:  [id number]          Height:       61.0 in Accession #:    [id number]         Weight:       204.1 lb Date of Birth:  [birth year]          BSA:          1.905 m Patient Age:    56 years           BP:           113/62 mmHg Patient Gender: F                  HR:           92 bpm. Exam Location:  Inpatient Procedure: Limited Echo and Intracardiac Opacification Agent Indications:    CAD  History:        Patient has prior history of Echocardiogram examinations, most                 recent 08/09/2021. CAD and Previous Myocardial Infarction, Prior                 CABG; Risk Factors:Hypertension, Diabetes and Dyslipidemia.                 Kidney transplant.  Sonographer:    Merrie Roof RDCS Referring Phys: [id number] [place]  1. Apical psuedoaneuyrsm post MI. Left ventricular ejection fraction, by estimation, is 55 to 60%. The left ventricle has normal function. The left ventricle demonstrates regional wall motion abnormalities (see scoring  diagram/findings for description). FINDINGS  Left Ventricle: Apical psuedoaneuyrsm post MI. Left ventricular ejection fraction, by estimation, is 55 to 60%. The left ventricle has normal function. The left ventricle demonstrates regional wall motion abnormalities. Definity contrast agent was given  IV to  delineate the left ventricular endocardial borders.  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 Candee Furbish MD Electronically signed by Candee Furbish MD Signature Date/Time: 09/22/2021/11:18:36 AM    Final     Microbiology: Results for orders placed or performed during the hospital encounter of 09/20/21  Urine Culture     Status: Abnormal   Collection Time: 09/20/21  2:29 PM   Specimen: Urine, Clean Catch  Result Value Ref Range Status   Specimen Description URINE, CLEAN CATCH  Final   Special Requests   Final    NONE Performed at [facility], [place] [address] [place], [place] [address]    Culture >=100,000 COLONIES/mL ESCHERICHIA COLI (A)  Final   Report Status 09/22/2021 FINAL  Final   Organism ID, Bacteria ESCHERICHIA COLI (A)  Final      Susceptibility   Escherichia coli - MIC*    AMPICILLIN >=32 RESISTANT Resistant     CEFAZOLIN >=64 RESISTANT Resistant     CEFEPIME <=0.12 SENSITIVE Sensitive     CEFTRIAXONE 32 RESISTANT Resistant     CIPROFLOXACIN <=0.25 SENSITIVE Sensitive     GENTAMICIN <=1 SENSITIVE Sensitive     IMIPENEM <=0.25 SENSITIVE Sensitive     NITROFURANTOIN <=16 SENSITIVE Sensitive     TRIMETH/SULFA <=20 SENSITIVE Sensitive     AMPICILLIN/SULBACTAM >=32 RESISTANT Resistant     PIP/TAZO 8 SENSITIVE Sensitive     * >=100,000 COLONIES/mL ESCHERICHIA COLI  Culture, blood (routine x 2)     Status: None   Collection Time: 09/20/21  6:08 PM   Specimen: BLOOD LEFT HAND  Result Value Ref Range Status   Specimen Description BLOOD LEFT HAND  Final   Special Requests IN PEDIATRIC BOTTLE Blood Culture adequate volume  Final   Culture   Final    NO  GROWTH 5 DAYS Performed at [facility], [place] [address], [place], [place] [address]    Report Status 09/25/2021 FINAL  Final  Culture, blood (routine x 2)     Status: None   Collection Time: 09/20/21  6:08 PM   Specimen: BLOOD LEFT HAND  Result Value Ref Range Status   Specimen Description BLOOD LEFT HAND  Final   Special Requests IN PEDIATRIC BOTTLE Blood Culture adequate volume  Final   Culture   Final    NO GROWTH 5 DAYS Performed at [facility], [place] [address], [place], [place] [address]    Report Status 09/25/2021 FINAL  Final  Respiratory (~20 pathogens) panel by PCR     Status: None   Collection Time: 09/20/21  7:02 PM   Specimen: Nasopharyngeal Swab; Respiratory  Result Value Ref Range Status   Adenovirus NOT DETECTED NOT DETECTED Final   Coronavirus 229E NOT DETECTED NOT DETECTED Final    Comment: (NOTE) The Coronavirus on the Respiratory Panel, DOES NOT test for the novel  Coronavirus (2019 nCoV)    Coronavirus HKU1 NOT DETECTED NOT DETECTED Final   Coronavirus NL63 NOT DETECTED NOT DETECTED Final   Coronavirus OC43 NOT DETECTED NOT DETECTED Final   Metapneumovirus NOT DETECTED NOT DETECTED Final   Rhinovirus / Enterovirus NOT DETECTED NOT DETECTED Final   Influenza A NOT DETECTED NOT DETECTED Final   Influenza B NOT DETECTED NOT DETECTED Final   Parainfluenza Virus 1 NOT DETECTED NOT DETECTED Final   Parainfluenza Virus 2 NOT DETECTED NOT DETECTED Final   Parainfluenza Virus 3 NOT DETECTED NOT DETECTED Final   Parainfluenza Virus 4  NOT DETECTED NOT DETECTED Final   Respiratory Syncytial Virus NOT DETECTED NOT DETECTED Final   Bordetella pertussis NOT DETECTED NOT DETECTED Final   Bordetella Parapertussis NOT DETECTED NOT DETECTED Final   Chlamydophila pneumoniae NOT DETECTED NOT DETECTED Final   Mycoplasma pneumoniae NOT DETECTED NOT DETECTED Final    Comment: Performed at [facility], [place] [address], [place], [place] [address]  MRSA Next Gen  by PCR, Nasal     Status: None   Collection Time: 09/22/21  9:11 AM   Specimen: Nasal Mucosa; Nasal Swab  Result Value Ref Range Status   MRSA by PCR Next Gen NOT DETECTED NOT DETECTED Final    Comment: (NOTE) The GeneXpert MRSA Assay (FDA approved for NASAL specimens only), is one component of a comprehensive MRSA colonization surveillance program. It is not intended to diagnose MRSA infection nor to guide or monitor treatment for MRSA infections. Test performance is not FDA approved in patients less than 54 years old. Performed at [facility], [place] [address], [place], [place] [address]    *Note: Due to a large number of results and/or encounters for the requested time period, some results have not been displayed. A complete set of results can be found in Results Review.    Labs: CBC: Recent Labs  Lab 09/20/21 1102 09/21/21 0638 09/22/21 0605  WBC 14.8* 8.1 13.4*  NEUTROABS 12.2*  --   --   HGB 9.2* 8.4* 8.2*  HCT 29.4* 26.9* 26.3*  MCV 93.0 92.1 93.6  PLT 409* 363 009   Basic Metabolic Panel: Recent Labs  Lab 09/20/21 1102 09/21/21 0638 09/22/21 0605 09/23/21 0249 09/25/21 1030  NA 135 133* 129* 130* 135  K 3.2* 4.3 3.6 4.0 4.0  CL 96* 95* 90* 92* 91*  CO2 25 28 28 27  33*  GLUCOSE 216* 321* 380* 376* 271*  BUN 16 16 20 21 17   CREATININE 0.89 0.89 1.10* 0.91 0.93  CALCIUM 9.1 8.9 8.4* 8.4* 9.2  MG 1.3* 1.7  --   --   --    Liver Function Tests: No results for input(s): AST, ALT, ALKPHOS, BILITOT, PROT, ALBUMIN in the last 168 hours. CBG: Recent Labs  Lab 09/25/21 0751 09/25/21 1147 09/25/21 1614 09/25/21 2215 09/26/21 0815  GLUCAP 179* 362* 226* 201* 204*    Discharge time spent: greater than 30 minutes.  Signed: Tawni Millers, MD [facility] 09/26/2021

## 2021-09-29 NOTE — Telephone Encounter (Signed)
Katherine [name] w/ [facility] check request for hospice order, informed caller per phon not below, form has been signed by pcp and fax back on 09-24-2021

## 2021-10-01 ENCOUNTER — Ambulatory Visit (INDEPENDENT_AMBULATORY_CARE_PROVIDER_SITE_OTHER): Payer: Medicare Other | Admitting: *Deleted

## 2021-10-01 ENCOUNTER — Encounter: Payer: Self-pay | Admitting: *Deleted

## 2021-10-01 DIAGNOSIS — J9611 Chronic respiratory failure with hypoxia: Secondary | ICD-10-CM

## 2021-10-01 DIAGNOSIS — E1142 Type 2 diabetes mellitus with diabetic polyneuropathy: Secondary | ICD-10-CM

## 2021-10-01 DIAGNOSIS — I509 Heart failure, unspecified: Secondary | ICD-10-CM

## 2021-10-01 NOTE — Chronic Care Management (AMB) (Signed)
Chronic Care Management   CCM RN Visit Note  10/01/2021 Name: Katherine [name] MRN: [id number] DOB: Sep 01, 1955  Subjective: Katherine [name] is a 66 y.o. year old female who is a primary care patient of [name], MD. The care management team was consulted for assistance with disease management and care coordination needs.    Collaboration with Raynelle Dick, hospital liaison for [facility]  for  case closure  in response to provider referral for case management and/or care coordination services.   Consent to Services:  The patient was given information about Chronic Care Management services, agreed to services, and gave verbal consent prior to initiation of services.  Please see initial visit note for detailed documentation.  Patient agreed to services and verbal consent obtained.   Assessment: Review of patient past medical history, allergies, medications, health status, including review of consultants reports, laboratory and other test data, was performed as part of comprehensive evaluation and provision of chronic care management services.  CCM Care Plan  Allergies  Allergen Reactions   Tetracycline Hives    Patient tolerated Doxycycline Dec 2020   Niacin Other (See Comments)    Mouth blisters   Niaspan [Niacin Er] Other (See Comments)    Mouth blisters   Sulfa Antibiotics Nausea Only and Other (See Comments)    "Tears up stomach"   Sulfonamide Derivatives Other (See Comments)    Reaction: per patient "tears her stomach up"   Codeine Nausea And Vomiting   Erythromycin Nausea And Vomiting   Hydromorphone Hcl Nausea And Vomiting   Morphine And Related Nausea And Vomiting   Nalbuphine Nausea And Vomiting    Nubain   Sulfasalazine Nausea Only and Other (See Comments)    per patient "tears her stomach up", "Tears up stomach"   Tape Rash and Other (See Comments)    No "plastic" tape," please----cloth tape only   Outpatient Encounter Medications as of 10/01/2021   Medication Sig   albuterol (VENTOLIN HFA) 108 (90 Base) MCG/ACT inhaler TAKE 2 PUFFS BY MOUTH EVERY 6 HOURS AS NEEDED FOR WHEEZE OR SHORTNESS OF BREATH (Patient taking differently: 2 puffs every 6 (six) hours as needed for wheezing or shortness of breath.)   apixaban (ELIQUIS) 5 MG TABS tablet Take 1 tablet (5 mg total) by mouth 2 (two) times daily. APPOINTMENT NEEDED WITH CARDIOLOGIST FOR FURTHER REFILLS (Patient taking differently: Take 5 mg by mouth 2 (two) times daily.)   arformoterol (BROVANA) 15 MCG/2ML NEBU Take 2 mLs (15 mcg total) by nebulization 2 (two) times daily.   ascorbic acid (VITAMIN C) 500 MG tablet Take 1 tablet (500 mg total) by mouth daily.   azaTHIOprine (IMURAN) 50 MG tablet Take 125 mg by mouth See admin instructions. Take 2 1/2 tablets (125 mg) by mouth daily at 3pm   budesonide (PULMICORT) 0.5 MG/2ML nebulizer solution Take 2 mLs (0.5 mg total) by nebulization 2 (two) times daily.   calcitRIOL (ROCALTROL) 0.25 MCG capsule Take 0.25 mcg by mouth every 3 (three) days.    cetirizine (ZYRTEC) 5 MG tablet Take 5 mg by mouth daily.   clopidogrel (PLAVIX) 75 MG tablet Take 1 tablet (75 mg total) by mouth daily.   cyclobenzaprine (FLEXERIL) 10 MG tablet Take 1 tablet (10 mg total) by mouth 3 (three) times daily as needed for muscle spasms.   fluticasone (FLONASE) 50 MCG/ACT nasal spray PLACE 2 SPRAYS INTO BOTH NOSTRILS 2 TIMES DAILY. (Patient taking differently: 2 sprays 2 (two) times daily as needed for allergies.)  furosemide (LASIX) 40 MG tablet Take 1 tablet (40 mg total) by mouth daily.   gabapentin (NEURONTIN) 100 MG capsule Take 1 capsule (100 mg total) by mouth at bedtime.   glimepiride (AMARYL) 4 MG tablet Take 4 mg by mouth daily.   insulin aspart (NOVOLOG FLEXPEN) 100 UNIT/ML FlexPen Inject 4-10 Units into the skin 3 (three) times daily with meals. Per sliding scale :not provided   Insulin Glargine (BASAGLAR KWIKPEN) 100 UNIT/ML Inject 20 Units into the skin daily  before breakfast. (Patient taking differently: Inject 12 Units into the skin daily before breakfast.)   isosorbide mononitrate (IMDUR) 60 MG 24 hr tablet Take 1 tablet (60 mg total) by mouth daily.   lamoTRIgine (LAMICTAL) 25 MG tablet TAKE 1 TABLET BY MOUTH EVERYDAY AT BEDTIME   linaclotide (LINZESS) 145 MCG CAPS capsule Take 1 capsule (145 mcg total) by mouth daily before breakfast.   LORazepam (ATIVAN) 1 MG tablet Take 0.5 tablets (0.5 mg total) by mouth 2 (two) times daily as needed for anxiety.   metFORMIN (GLUCOPHAGE) 500 MG tablet Take 1 tablet (500 mg total) by mouth 2 (two) times daily.   metoprolol tartrate (LOPRESSOR) 25 MG tablet Take 1 tablet (25 mg total) by mouth 3 (three) times daily.   montelukast (SINGULAIR) 10 MG tablet TAKE 1 TABLET BY MOUTH EVERYDAY AT BEDTIME   Multiple Vitamin (MULTIVITAMIN WITH MINERALS) TABS tablet Take 1 tablet by mouth daily.   nitroGLYCERIN (NITROSTAT) 0.4 MG SL tablet Place 1 tablet (0.4 mg total) under the tongue every 5 (five) minutes as needed for chest pain.   Oxycodone HCl 10 MG TABS Take 1 tablet (10 mg total) by mouth 4 (four) times daily as needed. (Patient taking differently: Take 10 mg by mouth 4 (four) times daily as needed (pain).)   OXYGEN Inhale 4 L into the lungs continuous.   pantoprazole (PROTONIX) 40 MG tablet TAKE 1 TABLET BY MOUTH EVERY DAY (Patient taking differently: 40 mg daily.)   potassium chloride SA (KLOR-CON M) 20 MEQ tablet Take 1 tablet (20 mEq total) by mouth daily.   predniSONE (DELTASONE) 5 MG tablet Take 1 tablet (5 mg total) by mouth daily with breakfast.   promethazine (PHENERGAN) 25 MG tablet Take 25 mg by mouth every 6 (six) hours as needed for nausea or vomiting.   ranolazine (RANEXA) 500 MG 12 hr tablet Take 1 tablet (500 mg total) by mouth 2 (two) times daily.   Respiratory Therapy Supplies (NEBULIZER) DEVI Pt needs nebulizr machine dx J44.9   rosuvastatin (CRESTOR) 20 MG tablet Take 1 tablet (20 mg total) by  mouth daily.   sertraline (ZOLOFT) 100 MG tablet Take 1 tablet (100 mg total) by mouth daily.   zinc sulfate 220 (50 Zn) MG capsule Take 1 capsule (220 mg total) by mouth daily.   No facility-administered encounter medications on file as of 10/01/2021.   Patient Active Problem List   Diagnosis Date Noted   Acute on chronic diastolic CHF (congestive heart failure) ([place]) 09/25/2021   Class 2 obesity 09/25/2021   E-coli UTI 09/22/2021   questionable pneumonia on CXR 09/20/2021   Dysuria 09/20/2021   Rest pain of lower extremity due to atherosclerosis ([place]) 09/02/2021   Longstanding persistent atrial fibrillation ([place])    MRSA pneumonia ([place]) 08/09/2021   Chronic diastolic CHF (congestive heart failure) ([place]) 08/06/2021   Obesity (BMI 30-39.9) 08/05/2021   Acute on chronic respiratory failure with hypoxia ([place]) 08/04/2021   Pressure injury of skin 08/04/2021   Polymorphic  ventricular tachycardia    Bradycardia    COPD (chronic obstructive pulmonary disease) ([facility])    History of renal transplant    Nausea & vomiting 04/19/2021   Coronary stent restenosis due to scar tissue    Acute on chronic systolic CHF (congestive heart failure) ([place]) 01/29/2021   Non-ST elevation (NSTEMI) myocardial infarction [facility])    Chronic respiratory failure with hypoxia ([place]) 01/01/2021   Need for pneumococcal vaccination 08/13/2020   Candidal skin infection 08/13/2020   Anxiety and depression 08/13/2020   Chronic bronchitis, mucopurulent ([place]) 08/24/2019   Current chronic use of systemic steroids 07/23/2019   OSA (obstructive sleep apnea)    Severe episode of recurrent major depressive disorder, with psychotic features ([place]) 02/16/2019   Obstructive chronic bronchitis without exacerbation ([place]) 11/03/2018   Long term (current) use of antithrombotics/antiplatelets 04/28/2018   Squamous cell carcinoma of skin of right thumb 02/02/2018   LBBB (left bundle branch block) 10/07/2017   Primary osteoarthritis of  right knee 08/18/2017   Ovarian cyst 05/18/2017   Pelvic mass in female 03/11/2017   Mild aortic stenosis by prior echocardiogram 01/28/2017   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   Deficiency anemia 10/14/2016   Normocytic anemia 10/13/2016   Fibromyalgia 03/30/2016   Other spondylosis with radiculopathy, lumbar region 03/30/2016   Type 2 diabetes mellitus with hyperlipidemia ([place]) 03/30/2016   Paroxysmal atrial fibrillation ([place]); CHA2DS2VASc score F, HTN, CAD, CVA = 5 06/28/2014   Debility 06/28/2014   Fluttering heart 06/21/2014   CAD S/P percutaneous coronary angioplasty - PCI x 5 to SVG-D1 09/11/2013    Class: Diagnosis of   Essential hypertension    NSTEMI (non-ST elevated myocardial infarction) with CAD S/P percutaneous coronary angioplasty - PCI x 5 to SVG-D1 08/23/2013   PAD (peripheral artery disease) ([place]) 08/17/2013   CAD (coronary artery disease) of bypass graft 10/08/2012   Stenosis of right carotid artery without infarction 10/08/2012   Dyslipidemia, goal LDL below 70 10/08/2012    Class: Diagnosis of   Mitral annular calcification 10/08/2012   Hypomagnesemia 06/13/2012   Hypokalemia and hypomagnesia  06/12/2012   Renal transplant disorder 06/12/2012   Chronic allergic rhinitis 04/29/2011   Extrinsic asthma 09/09/2007   GERD 09/09/2007   COUGH, CHRONIC 09/09/2007   Morbid obesity - s/p Lap Band 9/'05 05/07/2004    Class: Diagnosis of   Hx of CABG 09/07/1993    Class: History of   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 07/1993   Coronary artery disease involving native coronary artery of native heart with angina pectoris ([place]) 07/1993   ESRD (end stage renal disease) ([place]) 1991   Conditions to be addressed/monitored:  CHF and DMII  Care Plan : RN Care Manager Plan of Care  Updates made by Knox Royalty, RN since 10/01/2021 12:00 AM     Problem: Chronic Disease Management Needs      Long-Range Goal: Development of plan of care for  long term chronic disease management   Start Date: 06/24/2021  Expected End Date: 06/24/2022  Priority: High  Note:   Current Barriers:  Chronic Disease Management support and education needs related to CHF and DMII UNABLE to independently verbalize blood sugars at home, insulin dosing; does not record on paper, reports very inconsistent home monitoring and use of insulin Increased A1-C from 7.7 (07/11/21) to 9.1 (01/31/21) Multiple recent hospitalizations: since initial outreach 03/13/21--  9 inpatient hospitalizations over last 9 months, primarily for CHF exacerbation Inconsistent adherence to daily weight monitoring Chronic  pain- well-established with/ attends pain management clinic regularly Fragile state of health, multiple progressing chronic health conditions Memory loss- new patient neurological appointment scheduled 04/30/21- patient has difficulty focusing/ flight of thoughts/ ideas Polypharmacy with high risk medications noted- CCM Pharmacy team active in patient's care Limited social interactions: has recently moved in with her brother: 06/24/21- reports this is improved with this move Frequently missed or delayed provider office visits Hospital visit February 2-10, 2023 for acute on chronic CHF exacerbation: discharged to home under [facility]osp[facility] Home program through [facility]):  Patient will demonstrate Improved health management independence DMII; CHF  through collaboration with RN Care manager, provider, and care team.   Interventions: 1:1 collaboration with primary care provider regarding development and update of comprehensive plan of care as evidenced by provider attestation and co-signature Inter-disciplinary care team collaboration (see longitudinal plan of care) Evaluation of current treatment plan related to  self management and patient's adherence to plan as established by provider  10/01/21- CCM RN CM Case Closure: on 09/30/21, spoke with Raynelle Dick, hospital liaison for [facility]; Katherine [name] confirms patient is currently followed by the [facility] program; case closure accordingly; will make PCP and CCM team aware of same      Plan: No further follow up required: confirmed patient is currently under Hospice Care at home with [facility]; care closure accordingly  Oneta Rack, RN, BSN, [place] [phone]: direct office

## 2021-10-01 NOTE — Telephone Encounter (Signed)
Spoke with Inez Catalina and was able to inform her of the documents being sent in.

## 2021-10-02 ENCOUNTER — Telehealth: Payer: Self-pay | Admitting: Internal Medicine

## 2021-10-02 NOTE — Telephone Encounter (Signed)
Katherine [name] from [facility] has called and states she saw pt today for an "as needed" visit. Pt was having Sx of severe SOB.   States pt called EMS for herself and was given nebulizer, Nitro, and Oxycodone. Sx improved and chest pain improved.   Respiration was 30, ordering new oxygen 10L and requesting to try liquid morphine.    Callback #- [phone]  Please advise.

## 2021-10-02 NOTE — Telephone Encounter (Signed)
Verbal order given to Loma Sousa as Rosendo Gros was out of office at the time I returned the call.

## 2021-10-02 NOTE — Telephone Encounter (Signed)
Yes, ok to try - do they need an order?

## 2021-10-03 ENCOUNTER — Inpatient Hospital Stay (HOSPITAL_COMMUNITY)
Admission: EM | Admit: 2021-10-03 | Discharge: 2021-10-05 | DRG: 280 | Disposition: A | Discharge status: Hospice | Attending: Internal Medicine | Admitting: Internal Medicine

## 2021-10-03 ENCOUNTER — Ambulatory Visit: Payer: Medicare Other | Admitting: Cardiology

## 2021-10-03 ENCOUNTER — Other Ambulatory Visit: Payer: Self-pay

## 2021-10-03 ENCOUNTER — Encounter (HOSPITAL_COMMUNITY): Payer: Self-pay | Admitting: Internal Medicine

## 2021-10-03 ENCOUNTER — Emergency Department (HOSPITAL_COMMUNITY)

## 2021-10-03 DIAGNOSIS — Z8701 Personal history of pneumonia (recurrent): Secondary | ICD-10-CM

## 2021-10-03 DIAGNOSIS — Z7189 Other specified counseling: Secondary | ICD-10-CM | POA: Diagnosis not present

## 2021-10-03 DIAGNOSIS — Z7901 Long term (current) use of anticoagulants: Secondary | ICD-10-CM | POA: Diagnosis not present

## 2021-10-03 DIAGNOSIS — E1169 Type 2 diabetes mellitus with other specified complication: Secondary | ICD-10-CM | POA: Diagnosis present

## 2021-10-03 DIAGNOSIS — Z515 Encounter for palliative care: Secondary | ICD-10-CM

## 2021-10-03 DIAGNOSIS — E1165 Type 2 diabetes mellitus with hyperglycemia: Secondary | ICD-10-CM | POA: Diagnosis present

## 2021-10-03 DIAGNOSIS — Z955 Presence of coronary angioplasty implant and graft: Secondary | ICD-10-CM | POA: Diagnosis not present

## 2021-10-03 DIAGNOSIS — Z66 Do not resuscitate: Secondary | ICD-10-CM | POA: Diagnosis present

## 2021-10-03 DIAGNOSIS — Z951 Presence of aortocoronary bypass graft: Secondary | ICD-10-CM

## 2021-10-03 DIAGNOSIS — I10 Essential (primary) hypertension: Secondary | ICD-10-CM | POA: Diagnosis not present

## 2021-10-03 DIAGNOSIS — Z7902 Long term (current) use of antithrombotics/antiplatelets: Secondary | ICD-10-CM

## 2021-10-03 DIAGNOSIS — I5033 Acute on chronic diastolic (congestive) heart failure: Secondary | ICD-10-CM | POA: Diagnosis present

## 2021-10-03 DIAGNOSIS — Z9981 Dependence on supplemental oxygen: Secondary | ICD-10-CM | POA: Diagnosis not present

## 2021-10-03 DIAGNOSIS — I110 Hypertensive heart disease with heart failure: Principal | ICD-10-CM | POA: Diagnosis present

## 2021-10-03 DIAGNOSIS — J9611 Chronic respiratory failure with hypoxia: Secondary | ICD-10-CM

## 2021-10-03 DIAGNOSIS — Z8616 Personal history of COVID-19: Secondary | ICD-10-CM

## 2021-10-03 DIAGNOSIS — Z79899 Other long term (current) drug therapy: Secondary | ICD-10-CM | POA: Diagnosis not present

## 2021-10-03 DIAGNOSIS — I959 Hypotension, unspecified: Secondary | ICD-10-CM | POA: Diagnosis not present

## 2021-10-03 DIAGNOSIS — Z7401 Bed confinement status: Secondary | ICD-10-CM | POA: Diagnosis not present

## 2021-10-03 DIAGNOSIS — Z789 Other specified health status: Secondary | ICD-10-CM | POA: Diagnosis not present

## 2021-10-03 DIAGNOSIS — G4733 Obstructive sleep apnea (adult) (pediatric): Secondary | ICD-10-CM | POA: Diagnosis present

## 2021-10-03 DIAGNOSIS — Z8249 Family history of ischemic heart disease and other diseases of the circulatory system: Secondary | ICD-10-CM

## 2021-10-03 DIAGNOSIS — J9621 Acute and chronic respiratory failure with hypoxia: Secondary | ICD-10-CM | POA: Diagnosis present

## 2021-10-03 DIAGNOSIS — I2510 Atherosclerotic heart disease of native coronary artery without angina pectoris: Secondary | ICD-10-CM | POA: Diagnosis not present

## 2021-10-03 DIAGNOSIS — F32A Depression, unspecified: Secondary | ICD-10-CM | POA: Diagnosis not present

## 2021-10-03 DIAGNOSIS — R0600 Dyspnea, unspecified: Secondary | ICD-10-CM | POA: Diagnosis not present

## 2021-10-03 DIAGNOSIS — D649 Anemia, unspecified: Secondary | ICD-10-CM | POA: Diagnosis not present

## 2021-10-03 DIAGNOSIS — F17210 Nicotine dependence, cigarettes, uncomplicated: Secondary | ICD-10-CM | POA: Diagnosis present

## 2021-10-03 DIAGNOSIS — Z7952 Long term (current) use of systemic steroids: Secondary | ICD-10-CM

## 2021-10-03 DIAGNOSIS — Z8614 Personal history of Methicillin resistant Staphylococcus aureus infection: Secondary | ICD-10-CM | POA: Diagnosis not present

## 2021-10-03 DIAGNOSIS — E1151 Type 2 diabetes mellitus with diabetic peripheral angiopathy without gangrene: Secondary | ICD-10-CM | POA: Diagnosis present

## 2021-10-03 DIAGNOSIS — Z7951 Long term (current) use of inhaled steroids: Secondary | ICD-10-CM

## 2021-10-03 DIAGNOSIS — Z940 Kidney transplant status: Secondary | ICD-10-CM | POA: Diagnosis not present

## 2021-10-03 DIAGNOSIS — I480 Paroxysmal atrial fibrillation: Secondary | ICD-10-CM | POA: Diagnosis present

## 2021-10-03 DIAGNOSIS — Z8673 Personal history of transient ischemic attack (TIA), and cerebral infarction without residual deficits: Secondary | ICD-10-CM | POA: Diagnosis not present

## 2021-10-03 DIAGNOSIS — I249 Acute ischemic heart disease, unspecified: Secondary | ICD-10-CM | POA: Diagnosis present

## 2021-10-03 DIAGNOSIS — E871 Hypo-osmolality and hyponatremia: Secondary | ICD-10-CM | POA: Diagnosis not present

## 2021-10-03 DIAGNOSIS — I214 Non-ST elevation (NSTEMI) myocardial infarction: Secondary | ICD-10-CM | POA: Diagnosis not present

## 2021-10-03 DIAGNOSIS — R531 Weakness: Secondary | ICD-10-CM | POA: Diagnosis not present

## 2021-10-03 DIAGNOSIS — Z87442 Personal history of urinary calculi: Secondary | ICD-10-CM | POA: Diagnosis not present

## 2021-10-03 DIAGNOSIS — E785 Hyperlipidemia, unspecified: Secondary | ICD-10-CM | POA: Diagnosis present

## 2021-10-03 DIAGNOSIS — R110 Nausea: Secondary | ICD-10-CM | POA: Diagnosis not present

## 2021-10-03 DIAGNOSIS — J449 Chronic obstructive pulmonary disease, unspecified: Secondary | ICD-10-CM | POA: Diagnosis present

## 2021-10-03 DIAGNOSIS — T8610 Unspecified complication of kidney transplant: Secondary | ICD-10-CM | POA: Diagnosis present

## 2021-10-03 DIAGNOSIS — F419 Anxiety disorder, unspecified: Secondary | ICD-10-CM | POA: Diagnosis not present

## 2021-10-03 DIAGNOSIS — R0602 Shortness of breath: Secondary | ICD-10-CM | POA: Diagnosis not present

## 2021-10-03 DIAGNOSIS — Z794 Long term (current) use of insulin: Secondary | ICD-10-CM | POA: Diagnosis not present

## 2021-10-03 DIAGNOSIS — E118 Type 2 diabetes mellitus with unspecified complications: Secondary | ICD-10-CM | POA: Diagnosis not present

## 2021-10-03 DIAGNOSIS — Z800 Family history of malignant neoplasm of digestive organs: Secondary | ICD-10-CM

## 2021-10-03 DIAGNOSIS — K219 Gastro-esophageal reflux disease without esophagitis: Secondary | ICD-10-CM | POA: Diagnosis present

## 2021-10-03 DIAGNOSIS — Z7984 Long term (current) use of oral hypoglycemic drugs: Secondary | ICD-10-CM

## 2021-10-03 LAB — TROPONIN I (HIGH SENSITIVITY)
Troponin I (High Sensitivity): 3039 ng/L (ref <18)
Troponin I (High Sensitivity): 3032 ng/L (ref <18)

## 2021-10-03 LAB — BRAIN NATRIURETIC PEPTIDE: B Natriuretic Peptide: 800.5 pg/mL — ABNORMAL HIGH (ref 0.0–100.0)

## 2021-10-03 LAB — CBC WITH DIFFERENTIAL/PLATELET
Abs Immature Granulocytes: 0.07 10*3/uL (ref 0.00–0.07)
Basophils Absolute: 0 10*3/uL (ref 0.0–0.1)
Basophils Relative: 0 %
Eosinophils Absolute: 0.4 10*3/uL (ref 0.0–0.5)
Eosinophils Relative: 4 %
HCT: 28.3 % — ABNORMAL LOW (ref 36.0–46.0)
Hemoglobin: 8.8 g/dL — ABNORMAL LOW (ref 12.0–15.0)
Immature Granulocytes: 1 %
Lymphocytes Relative: 9 %
Lymphs Abs: 0.9 10*3/uL (ref 0.7–4.0)
MCH: 28.2 pg (ref 26.0–34.0)
MCHC: 31.1 g/dL (ref 30.0–36.0)
MCV: 90.7 fL (ref 80.0–100.0)
Monocytes Absolute: 1 10*3/uL (ref 0.1–1.0)
Monocytes Relative: 9 %
Neutro Abs: 7.7 10*3/uL (ref 1.7–7.7)
Neutrophils Relative %: 77 %
Platelets: 230 10*3/uL (ref 150–400)
RBC: 3.12 MIL/uL — ABNORMAL LOW (ref 3.87–5.11)
RDW: 19.2 % — ABNORMAL HIGH (ref 11.5–15.5)
WBC: 10.1 10*3/uL (ref 4.0–10.5)
nRBC: 0 % (ref 0.0–0.2)

## 2021-10-03 LAB — BASIC METABOLIC PANEL
Anion gap: 10 (ref 5–15)
BUN: 9 mg/dL (ref 8–23)
CO2: 24 mmol/L (ref 22–32)
Calcium: 9.1 mg/dL (ref 8.9–10.3)
Chloride: 100 mmol/L (ref 98–111)
Creatinine, Ser: 0.98 mg/dL (ref 0.44–1.00)
GFR, Estimated: >60 mL/min (ref >60)
Glucose, Bld: 147 mg/dL — ABNORMAL HIGH (ref 70–99)
Potassium: 3.8 mmol/L (ref 3.5–5.1)
Sodium: 134 mmol/L — ABNORMAL LOW (ref 135–145)

## 2021-10-03 LAB — RESP PANEL BY RT-PCR (FLU A&B, COVID) ARPGX2
Influenza A by PCR: NEGATIVE
Influenza B by PCR: NEGATIVE
SARS Coronavirus 2 by RT PCR: NEGATIVE

## 2021-10-03 LAB — CBG MONITORING, ED: Glucose-Capillary: 184 mg/dL — ABNORMAL HIGH (ref 70–99)

## 2021-10-03 MED ORDER — MORPHINE SULFATE (CONCENTRATE) 10 MG/0.5ML PO SOLN
10.0000 mg | Freq: Four times a day (QID) | ORAL | Status: DC
  Administered 2021-10-03 – 2021-10-05 (×4): 10 mg via ORAL
  Filled 2021-10-03 (×5): qty 0.5

## 2021-10-03 MED ORDER — LORAZEPAM 1 MG PO TABS
0.5000 mg | ORAL_TABLET | Freq: Two times a day (BID) | ORAL | Status: DC
  Administered 2021-10-03: 0.5 mg via ORAL
  Filled 2021-10-03: qty 1

## 2021-10-03 MED ORDER — HYDRALAZINE HCL 20 MG/ML IJ SOLN: 5.0000 mg | INTRAMUSCULAR | Status: DC | PRN

## 2021-10-03 MED ORDER — LAMOTRIGINE 25 MG PO TABS
25.0000 mg | ORAL_TABLET | Freq: Every day | ORAL | Status: DC
  Administered 2021-10-03 – 2021-10-04 (×2): 25 mg via ORAL
  Filled 2021-10-03 (×3): qty 1

## 2021-10-03 MED ORDER — BUDESONIDE 0.5 MG/2ML IN SUSP
2.0000 mL | Freq: Two times a day (BID) | RESPIRATORY_TRACT | Status: DC
  Administered 2021-10-03 – 2021-10-05 (×5): 0.5 mg via RESPIRATORY_TRACT
  Filled 2021-10-03 (×5): qty 2

## 2021-10-03 MED ORDER — BISACODYL 5 MG PO TBEC: 5.0000 mg | DELAYED_RELEASE_TABLET | Freq: Every day | ORAL | Status: DC | PRN

## 2021-10-03 MED ORDER — FLUTICASONE PROPIONATE 50 MCG/ACT NA SUSP
2.0000 | Freq: Every day | NASAL | Status: DC
  Administered 2021-10-04: 2 via NASAL
  Filled 2021-10-03: qty 16

## 2021-10-03 MED ORDER — ROSUVASTATIN CALCIUM 20 MG PO TABS
20.0000 mg | ORAL_TABLET | Freq: Every day | ORAL | Status: DC
  Administered 2021-10-03: 20 mg via ORAL
  Filled 2021-10-03: qty 1

## 2021-10-03 MED ORDER — ONDANSETRON HCL 4 MG/2ML IJ SOLN: 4.0000 mg | Freq: Four times a day (QID) | INTRAMUSCULAR | Status: DC | PRN

## 2021-10-03 MED ORDER — MORPHINE SULFATE (CONCENTRATE) 10 MG/0.5ML PO SOLN: 5.0000 mg | ORAL | Status: DC | PRN

## 2021-10-03 MED ORDER — ACETAMINOPHEN 650 MG RE SUPP: 650.0000 mg | Freq: Four times a day (QID) | RECTAL | Status: DC | PRN

## 2021-10-03 MED ORDER — ONDANSETRON 4 MG PO TBDP
4.0000 mg | ORAL_TABLET | Freq: Three times a day (TID) | ORAL | Status: DC
  Administered 2021-10-03 – 2021-10-05 (×3): 4 mg via ORAL
  Filled 2021-10-03 (×5): qty 1

## 2021-10-03 MED ORDER — LORAZEPAM 2 MG/ML PO CONC: 1.0000 mg | ORAL | Status: DC | PRN

## 2021-10-03 MED ORDER — ONDANSETRON HCL 4 MG PO TABS: 4.0000 mg | ORAL_TABLET | Freq: Four times a day (QID) | ORAL | Status: DC | PRN

## 2021-10-03 MED ORDER — ALBUTEROL SULFATE (2.5 MG/3ML) 0.083% IN NEBU: 2.5000 mg | INHALATION_SOLUTION | Freq: Four times a day (QID) | RESPIRATORY_TRACT | Status: DC | PRN

## 2021-10-03 MED ORDER — INSULIN GLARGINE-YFGN 100 UNIT/ML ~~LOC~~ SOLN
12.0000 [IU] | Freq: Every day | SUBCUTANEOUS | Status: DC
  Administered 2021-10-03: 12 [IU] via SUBCUTANEOUS
  Filled 2021-10-03 (×3): qty 0.12

## 2021-10-03 MED ORDER — OLANZAPINE 5 MG PO TABS
5.0000 mg | ORAL_TABLET | Freq: Every day | ORAL | Status: DC
  Administered 2021-10-03 – 2021-10-04 (×2): 5 mg via ORAL
  Filled 2021-10-03 (×3): qty 1

## 2021-10-03 MED ORDER — POLYETHYLENE GLYCOL 3350 17 G PO PACK: 17.0000 g | PACK | Freq: Every day | ORAL | Status: DC | PRN

## 2021-10-03 MED ORDER — TRAZODONE HCL 50 MG PO TABS
25.0000 mg | ORAL_TABLET | Freq: Every evening | ORAL | Status: DC | PRN
  Administered 2021-10-03: 25 mg via ORAL
  Filled 2021-10-03: qty 1

## 2021-10-03 MED ORDER — LORATADINE 10 MG PO TABS
10.0000 mg | ORAL_TABLET | Freq: Every day | ORAL | Status: DC
Start: 2021-10-03 — End: 2021-10-05
  Administered 2021-10-03 – 2021-10-05 (×3): 10 mg via ORAL
  Filled 2021-10-03 (×3): qty 1

## 2021-10-03 MED ORDER — GABAPENTIN 100 MG PO CAPS
100.0000 mg | ORAL_CAPSULE | Freq: Every day | ORAL | Status: DC
  Administered 2021-10-03 – 2021-10-04 (×2): 100 mg via ORAL
  Filled 2021-10-03 (×2): qty 1

## 2021-10-03 MED ORDER — PANTOPRAZOLE SODIUM 40 MG PO TBEC
40.0000 mg | DELAYED_RELEASE_TABLET | Freq: Every day | ORAL | Status: DC
  Administered 2021-10-03: 40 mg via ORAL
  Filled 2021-10-03: qty 1

## 2021-10-03 MED ORDER — HALOPERIDOL LACTATE 5 MG/ML IJ SOLN: 2.0000 mg | Freq: Four times a day (QID) | INTRAMUSCULAR | Status: DC | PRN

## 2021-10-03 MED ORDER — GLYCOPYRROLATE 1 MG PO TABS
1.0000 mg | ORAL_TABLET | ORAL | Status: DC | PRN
  Filled 2021-10-03: qty 1

## 2021-10-03 MED ORDER — FUROSEMIDE 10 MG/ML IJ SOLN: 40.0000 mg | Freq: Two times a day (BID) | INTRAMUSCULAR | Status: DC

## 2021-10-03 MED ORDER — BIOTENE DRY MOUTH MT LIQD: 15.0000 mL | OROMUCOSAL | Status: DC | PRN

## 2021-10-03 MED ORDER — MORPHINE SULFATE (CONCENTRATE) 10 MG/0.5ML PO SOLN
5.0000 mg | ORAL | Status: DC | PRN
  Filled 2021-10-03: qty 0.5

## 2021-10-03 MED ORDER — CYCLOBENZAPRINE HCL 10 MG PO TABS: 10.0000 mg | ORAL_TABLET | Freq: Three times a day (TID) | ORAL | Status: DC | PRN

## 2021-10-03 MED ORDER — ARFORMOTEROL TARTRATE 15 MCG/2ML IN NEBU
15.0000 ug | INHALATION_SOLUTION | Freq: Two times a day (BID) | RESPIRATORY_TRACT | Status: DC
  Administered 2021-10-03 – 2021-10-05 (×5): 15 ug via RESPIRATORY_TRACT
  Filled 2021-10-03 (×5): qty 2

## 2021-10-03 MED ORDER — INSULIN ASPART 100 UNIT/ML IJ SOLN
0.0000 [IU] | Freq: Three times a day (TID) | INTRAMUSCULAR | Status: DC
  Administered 2021-10-03: 2 [IU] via SUBCUTANEOUS

## 2021-10-03 MED ORDER — ONDANSETRON HCL 4 MG/2ML IJ SOLN
4.0000 mg | Freq: Once | INTRAMUSCULAR | Status: AC
  Administered 2021-10-03: 4 mg via INTRAVENOUS
  Filled 2021-10-03: qty 2

## 2021-10-03 MED ORDER — RANOLAZINE ER 500 MG PO TB12
500.0000 mg | ORAL_TABLET | Freq: Two times a day (BID) | ORAL | Status: DC
  Administered 2021-10-03 – 2021-10-05 (×5): 500 mg via ORAL
  Filled 2021-10-03 (×6): qty 1

## 2021-10-03 MED ORDER — HEPARIN (PORCINE) 25000 UT/250ML-% IV SOLN
1000.0000 [IU]/h | INTRAVENOUS | Status: DC
  Administered 2021-10-03: 1000 [IU]/h via INTRAVENOUS
  Filled 2021-10-03: qty 250

## 2021-10-03 MED ORDER — MORPHINE SULFATE (PF) 4 MG/ML IV SOLN
4.0000 mg | Freq: Once | INTRAVENOUS | Status: AC
  Administered 2021-10-03: 4 mg via INTRAVENOUS
  Filled 2021-10-03: qty 1

## 2021-10-03 MED ORDER — FUROSEMIDE 10 MG/ML IJ SOLN
40.0000 mg | Freq: Once | INTRAMUSCULAR | Status: AC
Start: 2021-10-03 — End: 2021-10-03
  Administered 2021-10-03: 40 mg via INTRAVENOUS
  Filled 2021-10-03: qty 4

## 2021-10-03 MED ORDER — METOPROLOL TARTRATE 25 MG PO TABS
25.0000 mg | ORAL_TABLET | Freq: Three times a day (TID) | ORAL | Status: DC
Start: 2021-10-03 — End: 2021-10-05
  Administered 2021-10-03 – 2021-10-05 (×5): 25 mg via ORAL
  Filled 2021-10-03 (×6): qty 1

## 2021-10-03 MED ORDER — MONTELUKAST SODIUM 10 MG PO TABS
10.0000 mg | ORAL_TABLET | Freq: Every day | ORAL | Status: DC
  Administered 2021-10-03 – 2021-10-04 (×2): 10 mg via ORAL
  Filled 2021-10-03 (×3): qty 1

## 2021-10-03 MED ORDER — GLYCOPYRROLATE 0.2 MG/ML IJ SOLN: 0.2000 mg | INTRAMUSCULAR | Status: DC | PRN

## 2021-10-03 MED ORDER — MORPHINE SULFATE (PF) 2 MG/ML IV SOLN
2.0000 mg | Freq: Once | INTRAVENOUS | Status: AC
  Administered 2021-10-03: 2 mg via INTRAVENOUS
  Filled 2021-10-03: qty 1

## 2021-10-03 MED ORDER — AZATHIOPRINE 50 MG PO TABS
125.0000 mg | ORAL_TABLET | Freq: Every day | ORAL | Status: DC
  Administered 2021-10-03 – 2021-10-05 (×3): 125 mg via ORAL
  Filled 2021-10-03 (×3): qty 3

## 2021-10-03 MED ORDER — SODIUM CHLORIDE 0.9% FLUSH
3.0000 mL | Freq: Two times a day (BID) | INTRAVENOUS | Status: DC
  Administered 2021-10-03 – 2021-10-05 (×5): 3 mL via INTRAVENOUS

## 2021-10-03 MED ORDER — PANTOPRAZOLE SODIUM 40 MG PO TBEC
40.0000 mg | DELAYED_RELEASE_TABLET | Freq: Once | ORAL | Status: AC
  Administered 2021-10-04: 40 mg via ORAL
  Filled 2021-10-03: qty 1

## 2021-10-03 MED ORDER — PREDNISONE 5 MG PO TABS
5.0000 mg | ORAL_TABLET | Freq: Every day | ORAL | Status: DC
Start: 2021-10-03 — End: 2021-10-05
  Administered 2021-10-04 – 2021-10-05 (×2): 5 mg via ORAL
  Filled 2021-10-03 (×5): qty 1

## 2021-10-03 MED ORDER — SERTRALINE HCL 100 MG PO TABS
100.0000 mg | ORAL_TABLET | Freq: Every day | ORAL | Status: DC
Start: 2021-10-03 — End: 2021-10-05
  Administered 2021-10-03 – 2021-10-05 (×3): 100 mg via ORAL
  Filled 2021-10-03 (×3): qty 1

## 2021-10-03 MED ORDER — DOCUSATE SODIUM 100 MG PO CAPS
100.0000 mg | ORAL_CAPSULE | Freq: Two times a day (BID) | ORAL | Status: DC
  Administered 2021-10-03 – 2021-10-05 (×5): 100 mg via ORAL
  Filled 2021-10-03 (×5): qty 1

## 2021-10-03 MED ORDER — HALOPERIDOL 1 MG PO TABS
2.0000 mg | ORAL_TABLET | Freq: Four times a day (QID) | ORAL | Status: DC | PRN
  Filled 2021-10-03: qty 2

## 2021-10-03 MED ORDER — ACETAMINOPHEN 325 MG PO TABS
650.0000 mg | ORAL_TABLET | Freq: Four times a day (QID) | ORAL | Status: DC | PRN
  Administered 2021-10-04: 650 mg via ORAL
  Filled 2021-10-03: qty 2

## 2021-10-03 MED ORDER — POLYVINYL ALCOHOL 1.4 % OP SOLN
1.0000 [drp] | Freq: Four times a day (QID) | OPHTHALMIC | Status: DC | PRN
  Filled 2021-10-03: qty 15

## 2021-10-03 MED ORDER — OXYCODONE HCL 5 MG PO TABS
5.0000 mg | ORAL_TABLET | Freq: Four times a day (QID) | ORAL | Status: DC | PRN
  Administered 2021-10-03 – 2021-10-05 (×4): 5 mg via ORAL
  Filled 2021-10-03 (×4): qty 1

## 2021-10-03 MED ORDER — CLOPIDOGREL BISULFATE 75 MG PO TABS
75.0000 mg | ORAL_TABLET | Freq: Every day | ORAL | Status: DC
  Administered 2021-10-03 – 2021-10-05 (×3): 75 mg via ORAL
  Filled 2021-10-03 (×3): qty 1

## 2021-10-03 MED ORDER — ASPIRIN 81 MG PO CHEW
324.0000 mg | CHEWABLE_TABLET | Freq: Once | ORAL | Status: DC
  Filled 2021-10-03: qty 4

## 2021-10-03 MED ORDER — HALOPERIDOL LACTATE 2 MG/ML PO CONC
2.0000 mg | Freq: Four times a day (QID) | ORAL | Status: DC | PRN
  Filled 2021-10-03: qty 1

## 2021-10-03 MED ORDER — OXYCODONE HCL 5 MG PO TABS
5.0000 mg | ORAL_TABLET | Freq: Four times a day (QID) | ORAL | Status: DC | PRN
  Administered 2021-10-03: 5 mg via ORAL
  Filled 2021-10-03: qty 1

## 2021-10-03 MED ORDER — ISOSORBIDE MONONITRATE ER 60 MG PO TB24
60.0000 mg | ORAL_TABLET | Freq: Every day | ORAL | Status: DC
  Administered 2021-10-03 – 2021-10-05 (×3): 60 mg via ORAL
  Filled 2021-10-03: qty 1
  Filled 2021-10-03: qty 2
  Filled 2021-10-03: qty 1

## 2021-10-03 NOTE — H&P (Addendum)
Addendum:  Palliative care consulted on the patient and she now wishes to transition to full comfort measures and go home with hospice.  This has been coordinated via the assistance of our Palliative Care team, the [facility] team, and the patient's home hospice team.  She has all of the supplies and equipment she needs at home and is ready for dc at this time.  However, she has decided that she prefers to stay for tonight and is now uncertain about whether home hospice, residential hospice, or even in hospital demise is preferred.  She is in agreement with admission with comfort care only.   Patient: Katherine [name] DOB: [birth year] DOA: 10/03/2021 DOS: the patient was seen and examined on 10/03/2021 PCP: Katherine Lima, MD  Patient coming from: Home - lives with brother, Katherine [name]; [place]: Niece, Katherine [name], [phone]   Chief Complaint: SOB  HPI: Katherine [name] is a 66 y.o. female with medical history significant of severe CAD s/p stents and CABG; COPD on home O2; DM; HLD; ESRD; HTN; OSA not on CPAP; afib; and RA presenting with SOB.  She was last admitted from 2/4-10 with decompensated heart failure and NSTEMI.   Prior recent hospitalizations also include 12/2-1/9 for COVID and MRSA PNA and 1/17-26 with NSTEMI with unsuccessful PCI complicated by hematoma.  With this last hospitalization, she was diuresed and started on heparin for [facility] and decided to go home with hospice.  The patient is quite somnolent this AM.  She reports that she was doing reasonably well since home for the last week but developed severe CP and SOB overnight.  Hospice has been coming to her home but she is unable to describe what services they are providing.  She is also not able to clearly state her goals of care at this time - she was offered more aggressive treatment with heparin and diuresis vs. Transitioning to comfort and she struggled with this question.    ER Course:  Hospice patient, here with SOB, CHF  exacerbation, NSTEMI with troponin 3000.  Technically difficult cath last week, decided to go DNR, hospice, no aggressive measures.   Review of Systems: As mentioned in the history of present illness. All other systems reviewed and are negative.  Limited by somnolence.  Past Medical History:  Diagnosis Date   Anemia    Anxiety    Bilateral carotid artery stenosis    Carotid duplex 0/6269: 4-85% LICA, 46-27% RICA, >03% RECA, f/u 1 yr suggested   CAD (coronary artery disease) of bypass graft 5/01; 3/'02, 8/'03, 10/'04; 1/15   PCI x 5 to SVG-D1    CAD in native artery 07/1993   3 Vessel Disease (LAD-D1 & RCA) -- CABG (Dx in setting of inferior STEMI-PTCA of RCA)   COPD mixed type ([place])    Followed by Dr. Lamonte Sakai "pulmonologist said no COPD"   Depression with anxiety    Diabetes mellitus type 2 in obese ([place])    Diarrhea    started after cholecystectomy and mass removed from intestine   Dyslipidemia, goal LDL below 70    08/2012: TC 137, TG 200, HDL 32!, LDL 45; on statin (followed by Dr.Deterding)   ESRD (end stage renal disease) ([place]) 1991   s/p Cadaveric Renal Transplant ([facility] - Dr. 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  Had having having lots of nausea and vomiting with poor p.o. intake.  Thought to have QT prolongation with multiple medications involved and hypomagnesemia, hypokalemia.  Tikosyn discontinued along with Zoloft and Phenergan.   Hypertension associated with diabetes ([place])    Mild aortic stenosis by prior echocardiogram 07/2019   Echo:  Mild aortic stenosis (gradients: Mean 14.3 mmHg -peak 24.9 mmHg).   Morbid obesity ([facility])    MRSA (methicillin resistant  staph aureus) culture positive    OSA (obstructive sleep apnea)    no longer on CPAP or home O2, states she doesn't need now after lap band   PAD (peripheral artery disease) ([place]) 08/2013   LEA Dopplers to be read by Dr. Fletcher Anon   PAF (paroxysmal atrial fibrillation) ([place]) 06/2014   Noted on CardioNet Monitor  - --> rhythm control with Tikosyn (Dr. Rayann Heman); converted from warfarin to apixaban for anticoagulation.   Pneumonia    Recurrent boils    Bilateral Groin   Rheumatoid arthritis ([place])    Per Patient Report; associated with OA   S/p cadaver renal transplant 1991   [facility]   Past Surgical History:  Procedure Laterality Date   ABDOMINAL AORTOGRAM N/A 04/21/2018   Procedure: ABDOMINAL AORTOGRAM;  Surgeon: [name], MD;  Location: [place] [facility];  Service: Cardiovascular;  Laterality: N/A;   CATHETER REMOVAL     CHOLECYSTECTOMY N/A 10/29/2014   Procedure: LAPAROSCOPIC CHOLECYSTECTOMY WITH INTRAOPERATIVE CHOLANGIOGRAM;  Surgeon: Excell Seltzer, MD;  Location: [facility];  Service: General;  Laterality: N/A;   CORONARY ANGIOPLASTY  1994   x5   CORONARY ARTERY BYPASS GRAFT  1995   [name]-LAD, SVG-RPDA, SVG-D1   CORONARY STENT INTERVENTION N/A 02/20/2021   Procedure: PERCUTANEOUS CORONARY STENT INTERVENTION;  Surgeon: [name], MD;  Location: [place] [facility]; ostLCx 60% (Neg RFR 0.96);; SVG- D2 recurrent 90% ISR & 95% native D2 after graft-> DES PCI of 95% anastomotic D2 lesion (Onyx Frontier 2.25 mm x 12 mm => 2.75 mm @ overlap, 2.5 distal.);PTCA of ISR in body of graft. ->  2.5 mm scoring balloon & post-dil w/ 2.75 mm [place] balloon   ESOPHAGOGASTRODUODENOSCOPY N/A 10/15/2016   Procedure: ESOPHAGOGASTRODUODENOSCOPY (EGD);  Surgeon: Wilford Corner, MD;  Location: [facility];  Service: Endoscopy;  Laterality: N/A;   I & D EXTREMITY Right 01/29/2018   Procedure: IRRIGATION AND DEBRIDEMENT THUMB;  Surgeon: Dayna Barker, MD;  Location: [place];  Service: Plastics;  Laterality:  Right;   INCISE AND DRAIN ABCESS     INTRAVASCULAR PRESSURE WIRE/FFR STUDY N/A 02/20/2021   Procedure: INTRAVASCULAR PRESSURE WIRE/FFR STUDY;  Surgeon: [name], MD;  Location: [place] [facility];  Service: Cardiovascular;  Laterality: N/A;   KIDNEY TRANSPLANT  1991   KNEE ARTHROSCOPY WITH LATERAL MENISECTOMY Left 12/03/2017   Procedure: LEFT KNEE ARTHROSCOPY WITH LATERAL MENISECTOMY;  Surgeon: Earlie Server, MD;  Location: [place];  Service: Orthopedics;  Laterality: Left;   LAPAROSCOPIC GASTRIC BANDING  04/2004; 10/'09, 2/'10   Port Replacement x 2   LEFT HEART CATH AND CORONARY ANGIOGRAPHY N/A 02/20/2021   Procedure: LEFT HEART CATH AND CORONARY ANGIOGRAPHY;  Surgeon: [name], MD;  Location: [place] [facility];  Service: Cardiovascular;  Laterality: N/A;   LEFT HEART CATH AND CORS/GRAFTS ANGIOGRAPHY N/A 04/21/2018  Procedure: LEFT HEART CATH AND CORS/GRAFTS ANGIOGRAPHY;  Surgeon: [name], MD;  Location: [place] [facility];  Ost-Prox LAD 50% - proxLAD (pre & post D1) 100% CTO. Cx - patent, small OM1 (stable ~ ostial OM1 90%, too small for PCI) & 2 small LPL; Ost-distal RCA 100% CTO.  [name]-LAD (not injected); SVG-dRCA patent, SVG-D1 - insertion stent ~20% ISR - Severe R CFA disease w/ focal Sub TO   LEFT HEART CATH AND CORS/GRAFTS ANGIOGRAPHY  5/'01, 3/'02, 8/'03, 10/'04; 1/'15   08/22/2013: LAD & RCA 100%; [name]-LAD & SVG-rPDA patent; Cx-- OM1 60%, OM2 ostial ~50%; SVG-D1 - 80% mid, 50% distal ISR --PCI   LEFT HEART CATH AND CORS/GRAFTS ANGIOGRAPHY N/A 01/31/2021   Procedure: LEFT HEART CATH AND CORS/GRAFTS ANGIOGRAPHY;  Surgeon: Jolaine Artist, MD;  Location: [place] [facility];;   LEFT HEART CATH AND CORS/GRAFTS ANGIOGRAPHY N/A 09/05/2021   Procedure: LEFT HEART CATH AND CORS/GRAFTS ANGIOGRAPHY;  Surgeon: [name], MD;  Location: [place] [facility];  Service: Cardiovascular;  Laterality: N/A;   LEFT HEART CATH AND CORS/GRAFTS ANGIOGRAPHY N/A 08/21/2021    Procedure: LEFT HEART CATH AND CORS/GRAFTS ANGIOGRAPHY;  Surgeon: Belva Crome, MD;  Location: [place] [facility];  Service: Cardiovascular;  Laterality: N/A;   LEFT HEART CATHETERIZATION WITH CORONARY/GRAFT ANGIOGRAM N/A 08/23/2013   Procedure: LEFT HEART CATHETERIZATION WITH Beatrix Fetters;  Surgeon: Wellington Hampshire, MD;  Location: [place] CATH LAB;  Service: Cardiovascular;  Laterality: N/A;   Lower Extremity Arterial Dopplers  08/2013   ABI: R 0.96, L 1.04   MULTIPLE TOOTH EXTRACTIONS  age 24   NM MYOVIEW LTD  03/2016   EF 62%. LOW RISK. C/W prior MI - no Ischemia. Apical hypokinesis.   PERCUTANEOUS CORONARY STENT INTERVENTION (PCI-S)  5/'01, 3/'02, 8/'03, 10/'04;   '01 - S660 BMS 2.5 x 9 - dSVG-D1 into D1; '02- post-stent stenosis - 2.5 x 8 Pixel BMS; '8\03: ISR/Thrombosis into native D1 - AngioJet, 2.5 x 13 Pixel; '04 - ISR 95% - covered stented area with Taxus DES 2.5 mm x 20 (2.88)   PERCUTANEOUS CORONARY STENT INTERVENTION (PCI-S)  08/23/2013   Procedure: PERCUTANEOUS CORONARY STENT INTERVENTION (PCI-S);  Surgeon: Wellington Hampshire, MD;  Location: [facility] CATH LAB;  Service: Cardiovascular;;mid SVG-D1 80%; distal stent ~50% ISR; Promus [place] DES 2.75 mm xc 20 mm (2.8 mm)   PORT-A-CATH REMOVAL     kidney   TRANSTHORACIC ECHOCARDIOGRAM  07/2019   a) 07/2019: EF 55 to 60%.  No LVH.  Paradoxical septal WM-s/p CABG.  GRII DD.  Nl RV size and fxn.  Mild bilateral atrial dilation.  Mod MAC.  Trace MR.  Mild AS (gradients: Mean 14.3 mmHg -peak 24.9 mmHg).; B) 06/2020: EF 40 to 45%.  Moderate concentric LVH.  GRII DD.  Elevated LAP.  Mod HK mid Apical Ant-AntSept wall & mild Apical Dyskinesis.  Mod LA dilation.  Mild MR.  AoV sclerosis w/o AS.   TRANSTHORACIC ECHOCARDIOGRAM  01/30/2021   EF 55 to 60%.  Mild LVH.  GR 1 DD.  Elevated LAP.  Moderate LA dilation.  Mild MR with mild MS.  Mild aortic valve stenosis.:   TUBAL LIGATION     wrist fistula repair Left    dialysis for one year    Social History:  reports that she has been smoking cigarettes. She has a 30.00 pack-year smoking history. She has never used smokeless tobacco. She reports that she does not drink alcohol and does not  use drugs.  Allergies  Allergen Reactions   Tetracycline Hives    Patient tolerated Doxycycline Dec 2020   Niacin Other (See Comments)    Mouth blisters   Niaspan [Niacin Er] Other (See Comments)    Mouth blisters   Sulfa Antibiotics Nausea Only and Other (See Comments)    "Tears up stomach"   Sulfonamide Derivatives Other (See Comments)    Reaction: per patient "tears her stomach up"   Codeine Nausea And Vomiting   Erythromycin Nausea And Vomiting   Hydromorphone Hcl Nausea And Vomiting   Morphine And Related Nausea And Vomiting   Nalbuphine Nausea And Vomiting    Nubain   Sulfasalazine Nausea Only and Other (See Comments)    per patient "tears her stomach up", "Tears up stomach"   Tape Rash and Other (See Comments)    No "plastic" tape," please----cloth tape only    Family History  Problem Relation Age of Onset   Cancer Mother        liver   Heart disease Father    Cancer Father        colon   Arrhythmia Brother        Atrial Fibrillation   Arrhythmia Paternal Aunt        Atrial Fibrillation    Prior to Admission medications   Medication Sig Start Date End Date Taking? Authorizing Provider  albuterol (VENTOLIN HFA) 108 (90 Base) MCG/ACT inhaler TAKE 2 PUFFS BY MOUTH EVERY 6 HOURS AS NEEDED FOR WHEEZE OR SHORTNESS OF BREATH Patient taking differently: 2 puffs every 6 (six) hours as needed for wheezing or shortness of breath. 02/10/21  Yes Byrum, Rose Fillers, MD  apixaban (ELIQUIS) 5 MG TABS tablet Take 1 tablet (5 mg total) by mouth 2 (two) times daily. APPOINTMENT NEEDED WITH CARDIOLOGIST FOR FURTHER REFILLS Patient taking differently: Take 5 mg by mouth 2 (two) times daily. 04/17/21  Yes Sherran Needs, NP  arformoterol (BROVANA) 15 MCG/2ML NEBU Take 2 mLs (15 mcg total)  by nebulization 2 (two) times daily. 08/30/19  Yes Collene Gobble, MD  azaTHIOprine (IMURAN) 50 MG tablet Take 125 mg by mouth See admin instructions. Take 2 1/2 tablets (125 mg) by mouth daily at 3pm   Yes [provider]  budesonide (PULMICORT) 0.5 MG/2ML nebulizer solution Take 2 mLs (0.5 mg total) by nebulization 2 (two) times daily. 08/24/19  Yes Katherine Lima, MD  cetirizine (ZYRTEC) 5 MG tablet Take 5 mg by mouth daily.   Yes [provider]  clopidogrel (PLAVIX) 75 MG tablet Take 1 tablet (75 mg total) by mouth daily. 02/22/21  Yes Cheryln Manly, NP  cyclobenzaprine (FLEXERIL) 10 MG tablet Take 1 tablet (10 mg total) by mouth 3 (three) times daily as needed for muscle spasms. 08/25/21  Yes Memon, Jolaine Artist, MD  fluticasone (FLONASE) 50 MCG/ACT nasal spray PLACE 2 SPRAYS INTO BOTH NOSTRILS 2 TIMES DAILY. Patient taking differently: 2 sprays in the morning and at bedtime. 06/27/21  Yes Collene Gobble, MD  furosemide (LASIX) 40 MG tablet Take 1 tablet (40 mg total) by mouth daily. 09/27/21 10/27/21 Yes Arrien, Jimmy Picket, MD  gabapentin (NEURONTIN) 100 MG capsule Take 1 capsule (100 mg total) by mouth at bedtime. 09/11/21  Yes Hosie Poisson, MD  glimepiride (AMARYL) 4 MG tablet Take 4 mg by mouth daily. 02/03/20  Yes [provider]  insulin aspart (NOVOLOG FLEXPEN) 100 UNIT/ML FlexPen Inject 4-10 Units into the skin 3 (three) times daily with meals. Per  sliding scale :not provided   Yes [provider]  Insulin Glargine (BASAGLAR KWIKPEN) 100 UNIT/ML Inject 20 Units into the skin daily before breakfast. Patient taking differently: Inject 12 Units into the skin daily before breakfast. 08/25/21  Yes Kathie Dike, MD  isosorbide mononitrate (IMDUR) 60 MG 24 hr tablet Take 1 tablet (60 mg total) by mouth daily. 09/27/21 10/27/21 Yes Arrien, Jimmy Picket, MD  lamoTRIgine (LAMICTAL) 25 MG tablet TAKE 1 TABLET BY MOUTH EVERYDAY AT BEDTIME Patient taking  differently: Take 25 mg by mouth at bedtime. 07/25/21  Yes Meredith Staggers, MD  LORazepam (ATIVAN) 1 MG tablet Take 0.5 tablets (0.5 mg total) by mouth 2 (two) times daily as needed for anxiety. Patient taking differently: Take 0.5 mg by mouth 2 (two) times daily. 08/25/21  Yes Kathie Dike, MD  metFORMIN (GLUCOPHAGE) 500 MG tablet Take 1 tablet (500 mg total) by mouth 2 (two) times daily. 02/22/21  Yes Burnell Blanks, MD  metoprolol tartrate (LOPRESSOR) 25 MG tablet Take 1 tablet (25 mg total) by mouth 3 (three) times daily. 09/11/21  Yes Hosie Poisson, MD  montelukast (SINGULAIR) 10 MG tablet TAKE 1 TABLET BY MOUTH EVERYDAY AT BEDTIME Patient taking differently: Take 10 mg by mouth at bedtime. 09/23/21  Yes Collene Gobble, MD  Multiple Vitamin (MULTIVITAMIN WITH MINERALS) TABS tablet Take 1 tablet by mouth daily. 09/12/21  Yes Hosie Poisson, MD  nitroGLYCERIN (NITROSTAT) 0.4 MG SL tablet Place 1 tablet (0.4 mg total) under the tongue every 5 (five) minutes as needed for chest pain. 02/21/21  Yes Burnell Blanks, MD  oxyCODONE (OXY IR/ROXICODONE) 5 MG immediate release tablet Take 5 mg by mouth 4 (four) times daily as needed for moderate pain. 09/29/21  Yes [provider]  OXYGEN Inhale 4 L into the lungs continuous.   Yes [provider]  pantoprazole (PROTONIX) 40 MG tablet TAKE 1 TABLET BY MOUTH EVERY DAY Patient taking differently: 40 mg daily. 07/31/21  Yes [name], MD  potassium chloride SA (KLOR-CON M) 20 MEQ tablet Take 1 tablet (20 mEq total) by mouth daily. 09/27/21 10/27/21 Yes Arrien, Jimmy Picket, MD  predniSONE (DELTASONE) 5 MG tablet Take 1 tablet (5 mg total) by mouth daily with breakfast. 09/27/21  Yes Arrien, Jimmy Picket, MD  promethazine (PHENERGAN) 25 MG tablet Take 25 mg by mouth every 6 (six) hours as needed for nausea or vomiting.   Yes [provider]  ranolazine (RANEXA) 500 MG 12 hr tablet Take 1 tablet (500 mg total) by  mouth 2 (two) times daily. 09/11/21  Yes Hosie Poisson, MD  Respiratory Therapy Supplies (NEBULIZER) DEVI Pt needs nebulizr machine dx J44.9 07/17/21  Yes Collene Gobble, MD  rosuvastatin (CRESTOR) 20 MG tablet Take 1 tablet (20 mg total) by mouth daily. 09/10/21  Yes [name], MD  sertraline (ZOLOFT) 100 MG tablet Take 1 tablet (100 mg total) by mouth daily. 06/06/21  Yes [name], MD  ascorbic acid (VITAMIN C) 500 MG tablet Take 1 tablet (500 mg total) by mouth daily. Patient not taking: Reported on 10/03/2021 09/12/21   Hosie Poisson, MD  linaclotide [facility]) 145 MCG CAPS capsule Take 1 capsule (145 mcg total) by mouth daily before breakfast. Patient not taking: Reported on 10/03/2021 08/25/21   Kathie Dike, MD  OLANZapine (ZYPREXA) 5 MG tablet Take 5 mg by mouth at bedtime. 10/02/21   [provider]  Oxycodone HCl 10 MG TABS Take 1 tablet (10 mg total)  by mouth 4 (four) times daily as needed. Patient not taking: Reported on 10/03/2021 07/24/21   Bayard Hugger, NP  zinc sulfate 220 (50 Zn) MG capsule Take 1 capsule (220 mg total) by mouth daily. Patient not taking: Reported on 10/03/2021 09/12/21   Hosie Poisson, MD    Physical Exam: Vitals:   10/03/21 0745 10/03/21 0800 10/03/21 0830 10/03/21 0845  BP: (!) 102/57 105/90 114/61 109/64  Pulse: 72 71 70 68  Resp: (!) 21 (!) 26 (!) 24 (!) 21  Temp:      TempSrc:      SpO2: 94% 98% 99% 98%  Weight:      Height:       General:  Appears chronically ill, older than stated age, on NRB Eyes:  PERRL, EOMI, normal lids, iris ENT:  grossly normal hearing, lips & tongue, mmm; NRB in place Neck:  no LAD, masses or thyromegaly Cardiovascular:  RRR, no m/r/g. No LE edema.  Respiratory:   R > L bibasilar crackles.  Mildly increased respiratory effort on NRB O2. Abdomen:  soft, NT, ND Skin:  no rash or induration seen on limited exam Musculoskeletal:  grossly normal tone BUE/BLE, good ROM, no bony abnormality Psychiatric:   somnolent mood and affect, speech fluent and appropriate with some word finding difficulty and drifting off to sleep mid-sentence Neurologic:  CN 2-12 grossly intact, moves all extremities in coordinated fashion   Radiological Exams on Admission: Independently reviewed - see discussion in A/P where applicable  DG Chest Port 1 View  Result Date: 10/03/2021 CLINICAL DATA:  Short of breath. EXAM: PORTABLE CHEST 1 VIEW COMPARISON:  09/23/2021 FINDINGS: Previous median sternotomy and CABG procedure. Stable cardiomediastinal contours. Interval increase in bilateral interstitial and airspace opacities. No acute osseous findings. IMPRESSION: Worsening aeration to the lungs bilaterally compatible with progressive pulmonary edema versus multifocal infection. Electronically Signed   By: Kerby Moors M.D.   On: 10/03/2021 05:52    EKG: Independently reviewed.  NSR with rate 81; prolonged QTc 503; LBBB with NSCSLT   Labs on Admission: I have personally reviewed the available labs and imaging studies at the time of the admission.  Pertinent labs:    Glucose 147 BNP 800.5; 423.8 on 2/4 HS troponin 3039, 3032; 400 on 2/5 WBC 10.1 Hgb 8.8    Assessment and Plan: * ACS (acute coronary syndrome) ([place])- (present on admission) -She has known obstructive CAD and a long-standing h/o CAD s/p multiple stents and CABG -She recently underwent unsuccessful PCI and has transitioned to hospice -Patient with acute onset of CP/SOB overnight -Troponin is markedly elevated and this is likely repeat NSTEMI -Unfortunately, she is not a candidate for further intervention -She was started on Heparin in the ER -I discussed the option of transitioning to comfort care and she is not ready to do this -Will admit to telemetry -Cardiology consult is pending -Continue Ranexa, Imdur, Plavix  Goals of care, counseling/discussion -She has a terminal condition and is enrolled in home hospice -She is likely to have  recurrent short-term admissions with the same issues until her demise -As such, transition to comfort care is reasonable -It does not appear that this has been discussed with her to date -Palliative care consult requested  Acute on chronic diastolic CHF (congestive heart failure) ([place])- (present on admission) -Recent echo with preserved EF, apical pseudoaneurysm; 01/2021 echo with grade 1 diastolic dysfunction -Acute CP and SOB were also likely related to CHF, possible flash pulmonary edema -CXR appears to show  progressive pulmonary edema and BNP is elevated from prior -She was given Lasix in the ER, will continue with 40 mg IV BID -Unfortunately, given the circumstances, this is likely to be an ongoing issue   Chronic respiratory failure with hypoxia ([place])- (present on admission) -Patient has COPD and recent COVID infection with PNA -On home O2 -Currently on NRB O2 -Currently worse related to pulmonary edema  Anxiety and depression- (present on admission) -Continue sertraline, lorazepam -Will add qhs trazodone prn  Type 2 diabetes mellitus with hyperlipidemia ([place])- (present on admission) -Her last A1c was 7.9 -Tight glycemic control is unlikely to benefit the patient and may lead to hypoglycemia -Continue glargine -Hold Amaryl and metformin -Will cover with sensitive-scale SSI without qhs coverage  Paroxysmal atrial fibrillation ([place]); CHA2DS2VASc score F, HTN, CAD, CVA = 5- (present on admission) -Rate controlled with Lopressor -Currently on Heparin but she was on Eliquis PTA  Essential hypertension- (present on admission) -Continue Lopressor -Will add prn IV hydralazine  Renal transplant disorder- (present on admission) -Continue Imuran, prednisone -She does not appear to need stress-dosed steroids at this time    Advance Care Planning:   Code Status: DNR   Consults: Cardiology; Palliative Care; HF navigator; [facility] team  DVT Prophylaxis: Heparin  Family  Communication: None present; I was unable to reach her niece by telephone at the time of admission  Severity of Illness: The appropriate patient status for this patient is INPATIENT. Inpatient status is judged to be reasonable and necessary in order to provide the required intensity of service to ensure the patient's safety. The patient's presenting symptoms, physical exam findings, and initial radiographic and laboratory data in the context of their chronic comorbidities is felt to place them at high risk for further clinical deterioration. Furthermore, it is not anticipated that the patient will be medically stable for discharge from the hospital within 2 midnights of admission.   * I certify that at the point of admission it is my clinical judgment that the patient will require inpatient hospital care spanning beyond 2 midnights from the point of admission due to high intensity of service, high risk for further deterioration and high frequency of surveillance required.*  Author: Karmen Bongo, MD 10/03/2021 9:36 AM  For on call review [email or web address].

## 2021-10-03 NOTE — ED Triage Notes (Signed)
Pt brought to ED via EMS from home with c/o shortness of breath. Pt states she was recently discharged from the hospital, currently hospice patient. EMS reports patient was 85% on baseline 5LNC, NRB applied and patient increased to 95%. Patient alert and oriented x 4 on arrival to ED, dysnea with exertion. MD present to assess patient on arrival.  EMS v/s: 84HR 111/62 BP

## 2021-10-03 NOTE — Progress Notes (Unsigned)
Primary Care Provider: [name], MD Cardiologist: [name], MD Electrophysiologist: None  Clinic Note: No chief complaint on file.  ===================================  ASSESSMENT/PLAN   Problem List Items Addressed This Visit   None   ===================================  HPI:    Katherine [name] is a 66 y.o. female with a extremely complicated PMH below who presents today for hospital follow-up after multiple hospitalizations.Katherine [name] was last seen on ***  Recent Hospitalizations: *** 08/04/2021-08/25/2021: [facility]-99 infection complicated by MRSA pneumonia superinfection, as well as non-STEMI.  Intermittent sepsis with hypotension.  Unsuccessful cardiac cath on January 5-no vascular access.  Bilateral lower femoral artery occlusion. => d/c to CIR 1/9-1/17/2023: @ CIR => 1/16 PM developed Angina - NTG x 2. Afib RVR 120s --> Cardiology C/s => transferred back to [facility].  1/17-1/26/2023: Unsuccessful attempt @ Cardiac Cath - unable to advance catheter beyond elbow 2/2 spasm. -> catheter removal resulted in hematoma. (Plavx, Isordil, Metoprolol& Ranexa). Vasc Sgx c/s for bilateral Common Iliofemoral occlusive PAD (too high risk for PV Sgx). => Palliative Care c/s 09/20/2021: Readmitted for worsening dyspnea - initially exertional, then @ rest. Diffuse rales => Acute on Chronic HFpEF. Diuresis (~5.4 L). Home O2 increased to 4L. Imdur increased to 60 mg, PO Lasix ~ 40 mg.  ==> d/c home on [facility].    Reviewed  CV studies:    The following studies were reviewed today: (if available, images/films reviewed: From Epic Chart or Care Everywhere) TTE 08/09/2021: EF 45 to 50%.  Dyskinetic apex, hypokinetic apical septum Limited TTE 09/21/2021: EF 55 to 60%.  Apical pseudoaneurysm post MI noted.  Dyskinetic/akinetic apical septal/apical wall. Cardiac cath attempted both January 5 and January.  Unable to perform catheterization due to lack of access.  >  Last remaining potential access I would be right ulnar artery versus brachial artery.  Previously successful radial access was not successful due to radial artery spasm.  Interval History:   Katherine [name]   CV Review of Symptoms (Summary) Cardiovascular ROS: {roscv:310661}  REVIEWED OF SYSTEMS   ROS  I have reviewed and (if needed) personally updated the patient's problem list, medications, allergies, past medical and surgical history, social and family history.   PAST MEDICAL HISTORY   Past Medical History:  Diagnosis Date   Anemia    Anxiety    Bilateral carotid artery stenosis    Carotid duplex 10/7167: 6-78% LICA, 93-81% RICA, >01% RECA, f/u 1 yr suggested   CAD (coronary artery disease) of bypass graft 5/01; 3/'02, 8/'03, 10/'04; 1/15   PCI x 5 to SVG-D1    CAD in native artery 07/1993   3 Vessel Disease (LAD-D1 & RCA) -- CABG (Dx in setting of inferior STEMI-PTCA of RCA)   CAD S/P percutaneous coronary angioplasty    PCI to SVG-D1 insertion/native D1 x 4 = '01 -(S660 BMS 2.5 x 9 anastomosis- D1); '02 - distal overlap ACS Pixel 2.5 x 8  BMS; '03 distal/native ISR/Thrombosis - Pixel 2.5 x 13; '04 - ISR-  Taxus 2.5 x 20 (covered all);; 1/15 - mid SVG-D1 (50% distal ISR) - Promus P 2.75 x 20 -- 2.8 mm; 6/22: Extensive ISR PTCA & Anastomotic-Native Diag DES PCI (Frontier Onyx 2.25x12 (2.75-2.5 mm post-dilation   COPD mixed type ([place])    Followed by Dr. Lamonte Sakai "pulmonologist said no COPD"   Depression with anxiety    Diabetes mellitus type 2 in obese ([place])    Diarrhea    started after  cholecystectomy and mass removed from intestine   Dyslipidemia, goal LDL below 70    08/2012: TC 137, TG 200, HDL 32!, LDL 45; on statin (followed by Dr.Deterding)   ESRD (end stage renal disease) ([place]) 1991   s/p Cadaveric Renal Transplant [facility] - Dr. Jimmy Footman)    Family history of adverse reaction to anesthesia    mom's bp dropped during/after anesthesia   Fibromyalgia    GERD  (gastroesophageal reflux disease)    Glomerulonephritis, chronic, rapidly progressive 1989   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 07/1993   Rescue PTCA of RCA -- referred for CABG.   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  Had having having lots of nausea and vomiting with poor p.o. intake.  Thought to have QT prolongation with multiple medications involved and hypomagnesemia, hypokalemia.  Tikosyn discontinued along with Zoloft and Phenergan.   Hypertension associated with diabetes ([place])    Mild aortic stenosis by prior echocardiogram 07/2019   Echo:  Mild aortic stenosis (gradients: Mean 14.3 mmHg -peak 24.9 mmHg).   Morbid obesity ([facility])    MRSA (methicillin resistant staph aureus) culture positive    OSA (obstructive sleep apnea)    no longer on CPAP or home O2, states she doesn't need now after lap band   PAD (peripheral artery disease) ([place]) 08/2013   LEA Dopplers to be read by Dr. Fletcher Anon   PAF (paroxysmal atrial fibrillation) ([place]) 06/2014   Noted on CardioNet Monitor  - --> rhythm control with Tikosyn (Dr. Rayann Heman); converted from warfarin to apixaban for anticoagulation.   Pneumonia    Recurrent boils    Bilateral Groin   Rheumatoid arthritis ([place])    Per Patient Report; associated with OA   S/p cadaver renal transplant 1991   [facility]   Torsades de pointes 05/12/2021   Unstable angina ([place]) 5/01; 3/'02, 8/'03, 10/'04; 1/15   x 5 occurences since Inf-Post STEMI in 1994   MOST recent successful Cardiac Catheterization and PCI 02/20/2021      PAST SURGICAL HISTORY   Past Surgical History:  Procedure Laterality Date   ABDOMINAL AORTOGRAM N/A 04/21/2018   Procedure: ABDOMINAL  AORTOGRAM;  Surgeon: [name], MD;  Location: [facility];  Service: Cardiovascular;  Laterality: N/A;   CATHETER REMOVAL     CHOLECYSTECTOMY N/A 10/29/2014   Procedure: LAPAROSCOPIC CHOLECYSTECTOMY WITH INTRAOPERATIVE CHOLANGIOGRAM;  Surgeon: Excell Seltzer, MD;  Location: [facility];  Service: General;  Laterality: N/A;   CORONARY ANGIOPLASTY  1994   x5   CORONARY ARTERY BYPASS GRAFT  1995   LIMA-LAD, SVG-RPDA, SVG-D1   CORONARY STENT INTERVENTION N/A 02/20/2021   Procedure: PERCUTANEOUS CORONARY STENT INTERVENTION;  Surgeon: [name], MD;  Location: [place] [facility]; ostLCx 60% (Neg RFR 0.96);; SVG- D2 recurrent 90% ISR & 95% native D2 after graft-> DES PCI of 95% anastomotic D2 lesion (Onyx Frontier 2.25 mm x 12 mm => 2.75 mm @ overlap, 2.5 distal.);PTCA of ISR in body of graft. ->  2.5 mm scoring balloon & post-dil w/ 2.75 mm [place] balloon   ESOPHAGOGASTRODUODENOSCOPY N/A 10/15/2016   Procedure: ESOPHAGOGASTRODUODENOSCOPY (EGD);  Surgeon: Evette Doffing  Michail Sermon, MD;  Location: [facility];  Service: Endoscopy;  Laterality: N/A;   I & D EXTREMITY Right 01/29/2018   Procedure: IRRIGATION AND DEBRIDEMENT THUMB;  Surgeon: Dayna Barker, MD;  Location: [place];  Service: Plastics;  Laterality: Right;   INCISE AND DRAIN ABCESS     INTRAVASCULAR PRESSURE WIRE/FFR STUDY N/A 02/20/2021   Procedure: INTRAVASCULAR PRESSURE WIRE/FFR STUDY;  Surgeon: [name], MD;  Location: [place] [facility];  Service: Cardiovascular;  Laterality: N/A;   KIDNEY TRANSPLANT  1991   KNEE ARTHROSCOPY WITH LATERAL MENISECTOMY Left 12/03/2017   Procedure: LEFT KNEE ARTHROSCOPY WITH LATERAL MENISECTOMY;  Surgeon: Earlie Server, MD;  Location: [place];  Service: Orthopedics;  Laterality: Left;   LAPAROSCOPIC GASTRIC BANDING  04/2004; 10/'09, 2/'10   Port Replacement x 2   LEFT HEART CATH AND CORONARY ANGIOGRAPHY N/A 02/20/2021   Procedure: LEFT HEART CATH AND CORONARY ANGIOGRAPHY;  Surgeon: [name], MD;  Location: [place] [facility];  Service: Cardiovascular;  Laterality: N/A;   LEFT HEART CATH AND CORS/GRAFTS ANGIOGRAPHY N/A 04/21/2018   Procedure: LEFT HEART CATH AND CORS/GRAFTS ANGIOGRAPHY;  Surgeon: [name], MD;  Location: [place] [facility];  Ost-Prox LAD 50% - proxLAD (pre & post D1) 100% CTO. Cx - patent, small OM1 (stable ~ ostial OM1 90%, too small for PCI) & 2 small LPL; Ost-distal RCA 100% CTO.  LIMA-LAD (not injected); SVG-dRCA patent, SVG-D1 - insertion stent ~20% ISR - Severe R CFA disease w/ focal Sub TO   LEFT HEART CATH AND CORS/GRAFTS ANGIOGRAPHY  5/'01, 3/'02, 8/'03, 10/'04; 1/'15   08/22/2013: LAD & RCA 100%; LIMA-LAD & SVG-rPDA patent; Cx-- OM1 60%, OM2 ostial ~50%; SVG-D1 - 80% mid, 50% distal ISR --PCI   LEFT HEART CATH AND CORS/GRAFTS ANGIOGRAPHY N/A 01/31/2021   Procedure: LEFT HEART CATH AND CORS/GRAFTS ANGIOGRAPHY;  Surgeon: Jolaine Artist, MD;  Location: [place] [facility];;   LEFT HEART CATH AND CORS/GRAFTS ANGIOGRAPHY N/A 09/05/2021   Procedure: LEFT HEART CATH AND CORS/GRAFTS ANGIOGRAPHY;  Surgeon: [name], MD;  Location: [place] [facility];  Service: Cardiovascular;  Laterality: N/A;   LEFT HEART CATH AND CORS/GRAFTS ANGIOGRAPHY N/A 08/21/2021   Procedure: LEFT HEART CATH AND CORS/GRAFTS ANGIOGRAPHY;  Surgeon: Belva Crome, MD;  Location: [place] [facility];  Service: Cardiovascular;  Laterality: N/A;   LEFT HEART CATHETERIZATION WITH CORONARY/GRAFT ANGIOGRAM N/A 08/23/2013   Procedure: LEFT HEART CATHETERIZATION WITH Beatrix Fetters;  Surgeon: Wellington Hampshire, MD;  Location: [facility];  Service: Cardiovascular;  Laterality: N/A;   Lower Extremity Arterial Dopplers  08/2013   ABI: R 0.96, L 1.04   MULTIPLE TOOTH EXTRACTIONS  age 32   NM MYOVIEW LTD  03/2016   EF 62%. LOW RISK. C/W prior MI - no Ischemia. Apical hypokinesis.   PERCUTANEOUS CORONARY STENT INTERVENTION (PCI-S)  5/'01, 3/'02, 8/'03, 10/'04;   '01 - S660 BMS  2.5 x 9 - dSVG-D1 into D1; '02- post-stent stenosis - 2.5 x 8 Pixel BMS; '8\03: ISR/Thrombosis into native D1 - AngioJet, 2.5 x 13 Pixel; '04 - ISR 95% - covered stented area with Taxus DES 2.5 mm x 20 (2.88)   PERCUTANEOUS CORONARY STENT INTERVENTION (PCI-S)  08/23/2013   Procedure: PERCUTANEOUS CORONARY STENT INTERVENTION (PCI-S);  Surgeon: Wellington Hampshire, MD;  Location: [facility];  Service: Cardiovascular;;mid SVG-D1 80%; distal stent ~50% ISR; Promus [place] DES 2.75 mm xc 20 mm (2.8 mm)   PORT-A-CATH REMOVAL  kidney   TRANSTHORACIC ECHOCARDIOGRAM  07/2019   a) 07/2019: EF 55 to 60%.  No LVH.  Paradoxical septal WM-s/p CABG.  GRII DD.  Nl RV size and fxn.  Mild bilateral atrial dilation.  Mod MAC.  Trace MR.  Mild AS (gradients: Mean 14.3 mmHg -peak 24.9 mmHg).; B) 06/2020: EF 40 to 45%.  Moderate concentric LVH.  GRII DD.  Elevated LAP.  Mod HK mid Apical Ant-AntSept wall & mild Apical Dyskinesis.  Mod LA dilation.  Mild MR.  AoV sclerosis w/o AS.   TRANSTHORACIC ECHOCARDIOGRAM  01/30/2021   EF 55 to 60%.  Mild LVH.  GR 1 DD.  Elevated LAP.  Moderate LA dilation.  Mild MR with mild MS.  Mild aortic valve stenosis.:   TUBAL LIGATION     wrist fistula repair Left    dialysis for one year    Immunization History  Administered Date(s) Administered   Influenza Split 05/11/2012, 05/17/2016, 05/17/2017   Influenza Whole 06/17/2009, 06/17/2010, 04/18/2011   Influenza,inj,Quad PF,6+ Mos 08/24/2013, 05/20/2015, 04/21/2018, 04/01/2019, 08/13/2020, 05/13/2021   PFIZER Comirnaty(Gray Top)Covid-19 Tri-Sucrose Vaccine 12/30/2019   Pneumococcal Conjugate-13 08/13/2020   Pneumococcal Polysaccharide-23 11/30/2017   Tdap 11/30/2017   Zoster Recombinat (Shingrix) 07/04/2018, 03/08/2019    MEDICATIONS/ALLERGIES   No outpatient medications have been marked as taking for the 10/03/21 encounter (Appointment) with [name], MD.    Allergies  Allergen Reactions   Tetracycline Hives     Patient tolerated Doxycycline Dec 2020   Niacin Other (See Comments)    Mouth blisters   Niaspan [Niacin Er] Other (See Comments)    Mouth blisters   Sulfa Antibiotics Nausea Only and Other (See Comments)    "Tears up stomach"   Sulfonamide Derivatives Other (See Comments)    Reaction: per patient "tears her stomach up"   Codeine Nausea And Vomiting   Erythromycin Nausea And Vomiting   Hydromorphone Hcl Nausea And Vomiting   Morphine And Related Nausea And Vomiting   Nalbuphine Nausea And Vomiting    Nubain   Sulfasalazine Nausea Only and Other (See Comments)    per patient "tears her stomach up", "Tears up stomach"   Tape Rash and Other (See Comments)    No "plastic" tape," please----cloth tape only    SOCIAL HISTORY/FAMILY HISTORY   Reviewed in Epic:  Pertinent findings:  Social History   Tobacco Use   Smoking status: Every Day    Packs/day: 1.00    Years: 30.00    Pack years: 30.00    Types: Cigarettes    Last attempt to quit: 08/17/2002    Years since quitting: 19.1   Smokeless tobacco: Never  Vaping Use   Vaping Use: Never used  Substance Use Topics   Alcohol use: No   Drug use: No   Social History   Social History Narrative   ** Merged History Encounter ** She is currently married, and the caregiver of her husband who is recovering from surgery for tongue cancer now diagnosed with lung cancer. Prior to his diagnosis of her husband, she actually had adopted a 73-year-old child who she knows caring for as well. With all the surgeries, they have been quite financially troubled. Thanks the help of her community and church, they have been able to stay "alfoat."     She is a former smoker who quit in 2004 after a 30-pack-year history.   She is active chasing a 62-year-old child, does not do routine exercise. She's been quite depressed with the  condition of her husband, and admits to eating comfort herself.   She does not drink alcohol.      05/13/2021 Patient reports that  her husband is deceased. She is grieving the loss of her husband and the decline in her physical independence.     OBJCTIVE -PE, EKG, labs   Wt Readings from Last 3 Encounters:  09/24/21 197 lb (89.4 kg)  09/11/21 202 lb 2.6 oz (91.7 kg)  09/02/21 203 lb 7.8 oz (92.3 kg)    Physical Exam: There were no vitals taken for this visit. Physical Exam   Adult ECG Report  Rate: *** ;  Rhythm: {rhythm:17366};   Narrative Interpretation: ***  Recent Labs:  ***  Lab Results  Component Value Date   CHOL 99 02/20/2021   HDL 51 02/20/2021   LDLCALC 15 02/20/2021   TRIG 165 (H) 02/20/2021   CHOLHDL 1.9 02/20/2021   Lab Results  Component Value Date   CREATININE 0.93 09/25/2021   BUN 17 09/25/2021   NA 135 09/25/2021   K 4.0 09/25/2021   CL 91 (L) 09/25/2021   CO2 33 (H) 09/25/2021   CBC Latest Ref Rng & Units 09/22/2021 09/21/2021 09/20/2021  WBC 4.0 - 10.5 K/uL 13.4(H) 8.1 14.8(H)  Hemoglobin 12.0 - 15.0 g/dL 8.2(L) 8.4(L) 9.2(L)  Hematocrit 36.0 - 46.0 % 26.3(L) 26.9(L) 29.4(L)  Platelets 150 - 400 K/uL 340 363 409(H)    Lab Results  Component Value Date   HGBA1C 7.9 (H) 08/11/2021   Lab Results  Component Value Date   TSH 0.736 08/23/2021    ==================================================  COVID-19 Education: The signs and symptoms of COVID-19 were discussed with the patient and how to seek care for testing (follow up with PCP or arrange E-visit).    I spent a total of ***minutes with the patient spent in direct patient consultation.  Additional time spent with chart review  / charting (studies, outside notes, etc): *** min Total Time: *** min  Current medicines are reviewed at length with the patient today.  (+/- concerns) ***  This visit occurred during the SARS-CoV-2 public health emergency.  Safety protocols were in place, including screening questions prior to the visit, additional usage of staff PPE, and extensive cleaning of exam room while observing  appropriate contact time as indicated for disinfecting solutions.  Notice: This dictation was prepared with Dragon dictation along with smart phrase technology. Any transcriptional errors that result from this process are unintentional and may not be corrected upon review.  Studies Ordered:   No orders of the defined types were placed in this encounter.   Patient Instructions / Medication Changes & Studies & Tests Ordered   There are no Patient Instructions on file for this visit.     [name], M.D., M.S. Interventional Cardiologist   Pager # [phone] Phone # [phone] [address]. [place], [place] [address]   Thank you for choosing [facility] at [facility]!!

## 2021-10-03 NOTE — Care Management (Addendum)
[place]- call in AM [phone]

## 2021-10-03 NOTE — ED Notes (Signed)
Pt assisted to [facility]. Tolerated well.

## 2021-10-03 NOTE — ED Provider Notes (Signed)
[facility] EMERGENCY DEPARTMENT Provider Note   CSN: [id number] Arrival date & time: 10/03/21  0459     History  Chief Complaint  Patient presents with   Shortness of Breath    Katherine [name] is a 66 y.o. female.  The history is provided by the patient and the EMS personnel.  Shortness of Breath She has history of hypertension, diabetes, hyperlipidemia, coronary artery disease, diastolic heart failure, peripheral vascular disease currently on hospice care who is brought in by ambulance because of progressive dyspnea at home.  She had been discharged from the hospital on 2/10 after being admitted for heart failure exacerbation.  At home, she is on constant oxygen.  About 2 days after discharge, she started having increasing dyspnea which got progressively worse each day.  She denies any chest pain, heaviness, tightness, pressure.  She denies nausea, vomiting.  She denies cough and denies fever or chills and denies diaphoresis.  EMS noted oxygen saturation of 85% while on oxygen by nasal cannula.  They applied a nonrebreather mask and patient felt better and oxygen level came up.   Home Medications Prior to Admission medications   Medication Sig Start Date End Date Taking? Authorizing Provider  albuterol (VENTOLIN HFA) 108 (90 Base) MCG/ACT inhaler TAKE 2 PUFFS BY MOUTH EVERY 6 HOURS AS NEEDED FOR WHEEZE OR SHORTNESS OF BREATH Patient taking differently: 2 puffs every 6 (six) hours as needed for wheezing or shortness of breath. 02/10/21   Byrum, Rose Fillers, MD  apixaban (ELIQUIS) 5 MG TABS tablet Take 1 tablet (5 mg total) by mouth 2 (two) times daily. APPOINTMENT NEEDED WITH CARDIOLOGIST FOR FURTHER REFILLS Patient taking differently: Take 5 mg by mouth 2 (two) times daily. 04/17/21   Sherran Needs, NP  arformoterol (BROVANA) 15 MCG/2ML NEBU Take 2 mLs (15 mcg total) by nebulization 2 (two) times daily. 08/30/19   Collene Gobble, MD  ascorbic acid (VITAMIN C) 500 MG  tablet Take 1 tablet (500 mg total) by mouth daily. 09/12/21   Hosie Poisson, MD  azaTHIOprine (IMURAN) 50 MG tablet Take 125 mg by mouth See admin instructions. Take 2 1/2 tablets (125 mg) by mouth daily at 3pm    [provider]  budesonide (PULMICORT) 0.5 MG/2ML nebulizer solution Take 2 mLs (0.5 mg total) by nebulization 2 (two) times daily. 08/24/19   [name], MD  calcitRIOL (ROCALTROL) 0.25 MCG capsule Take 0.25 mcg by mouth every 3 (three) days.     [provider]  cetirizine (ZYRTEC) 5 MG tablet Take 5 mg by mouth daily.    [provider]  clopidogrel (PLAVIX) 75 MG tablet Take 1 tablet (75 mg total) by mouth daily. 02/22/21   Cheryln Manly, NP  cyclobenzaprine (FLEXERIL) 10 MG tablet Take 1 tablet (10 mg total) by mouth 3 (three) times daily as needed for muscle spasms. 08/25/21   Kathie Dike, MD  fluticasone (FLONASE) 50 MCG/ACT nasal spray PLACE 2 SPRAYS INTO BOTH NOSTRILS 2 TIMES DAILY. Patient taking differently: 2 sprays 2 (two) times daily as needed for allergies. 06/27/21   Collene Gobble, MD  furosemide (LASIX) 40 MG tablet Take 1 tablet (40 mg total) by mouth daily. 09/27/21 10/27/21  Arrien, Jimmy Picket, MD  gabapentin (NEURONTIN) 100 MG capsule Take 1 capsule (100 mg total) by mouth at bedtime. 09/11/21   Hosie Poisson, MD  glimepiride (AMARYL) 4 MG tablet Take 4 mg by mouth daily. 02/03/20   [provider]  insulin aspart (NOVOLOG FLEXPEN) 100 UNIT/ML FlexPen Inject 4-10 Units into the skin 3 (three) times daily with meals. Per sliding scale :not provided    [provider]  Insulin Glargine (BASAGLAR KWIKPEN) 100 UNIT/ML Inject 20 Units into the skin daily before breakfast. Patient taking differently: Inject 12 Units into the skin daily before breakfast. 08/25/21   Kathie Dike, MD  isosorbide mononitrate (IMDUR) 60 MG 24 hr tablet Take 1 tablet (60 mg total) by mouth daily. 09/27/21 10/27/21  Arrien, Jimmy Picket, MD   lamoTRIgine (LAMICTAL) 25 MG tablet TAKE 1 TABLET BY MOUTH EVERYDAY AT BEDTIME 07/25/21   Meredith Staggers, MD  linaclotide [facility]) 145 MCG CAPS capsule Take 1 capsule (145 mcg total) by mouth daily before breakfast. 08/25/21   Kathie Dike, MD  LORazepam (ATIVAN) 1 MG tablet Take 0.5 tablets (0.5 mg total) by mouth 2 (two) times daily as needed for anxiety. 08/25/21   Kathie Dike, MD  metFORMIN (GLUCOPHAGE) 500 MG tablet Take 1 tablet (500 mg total) by mouth 2 (two) times daily. 02/22/21   Burnell Blanks, MD  metoprolol tartrate (LOPRESSOR) 25 MG tablet Take 1 tablet (25 mg total) by mouth 3 (three) times daily. 09/11/21   Hosie Poisson, MD  montelukast (SINGULAIR) 10 MG tablet TAKE 1 TABLET BY MOUTH EVERYDAY AT BEDTIME 09/23/21   Collene Gobble, MD  Multiple Vitamin (MULTIVITAMIN WITH MINERALS) TABS tablet Take 1 tablet by mouth daily. 09/12/21   Hosie Poisson, MD  nitroGLYCERIN (NITROSTAT) 0.4 MG SL tablet Place 1 tablet (0.4 mg total) under the tongue every 5 (five) minutes as needed for chest pain. 02/21/21   Burnell Blanks, MD  Oxycodone HCl 10 MG TABS Take 1 tablet (10 mg total) by mouth 4 (four) times daily as needed. Patient taking differently: Take 10 mg by mouth 4 (four) times daily as needed (pain). 07/24/21   Bayard Hugger, NP  OXYGEN Inhale 4 L into the lungs continuous.    [provider]  pantoprazole (PROTONIX) 40 MG tablet TAKE 1 TABLET BY MOUTH EVERY DAY Patient taking differently: 40 mg daily. 07/31/21   [name], MD  potassium chloride SA (KLOR-CON M) 20 MEQ tablet Take 1 tablet (20 mEq total) by mouth daily. 09/27/21 10/27/21  Arrien, Jimmy Picket, MD  predniSONE (DELTASONE) 5 MG tablet Take 1 tablet (5 mg total) by mouth daily with breakfast. 09/27/21   Arrien, Jimmy Picket, MD  promethazine (PHENERGAN) 25 MG tablet Take 25 mg by mouth every 6 (six) hours as needed for nausea or vomiting.    [provider]  ranolazine (RANEXA)  500 MG 12 hr tablet Take 1 tablet (500 mg total) by mouth 2 (two) times daily. 09/11/21   Hosie Poisson, MD  Respiratory Therapy Supplies (NEBULIZER) DEVI Pt needs nebulizr machine dx J44.9 07/17/21   Collene Gobble, MD  rosuvastatin (CRESTOR) 20 MG tablet Take 1 tablet (20 mg total) by mouth daily. 09/10/21   [name], MD  sertraline (ZOLOFT) 100 MG tablet Take 1 tablet (100 mg total) by mouth daily. 06/06/21   [name], MD  zinc sulfate 220 (50 Zn) MG capsule Take 1 capsule (220 mg total) by mouth daily. 09/12/21   Hosie Poisson, MD      Allergies    Tetracycline, Niacin, Niaspan [niacin er], Sulfa antibiotics, Sulfonamide derivatives, Codeine, Erythromycin, Hydromorphone hcl, Morphine and related, Nalbuphine, Sulfasalazine, and Tape    Review of Systems   Review of Systems  Respiratory:  Positive for shortness of breath.   All other systems reviewed and are negative.  Physical Exam Updated Vital Signs BP (!) 111/55    Pulse 77    Temp 98.8 F (37.1 C) (Oral)    Resp (!) 28    Ht 5\' 1"  (1.549 m)    Wt 89.4 kg    SpO2 95%    BMI 37.24 kg/m  Physical Exam Vitals and nursing note reviewed.  66 year old female, resting comfortably and in no acute distress. Vital signs are sniffing and for rapid respiratory rate. Oxygen saturation is 95%, which is normal. Head is normocephalic and atraumatic. PERRLA, EOMI. Oropharynx is clear. Neck is nontender and supple without adenopathy or JVD. Back is nontender and there is no CVA tenderness.  There is no presacral edema. Lungs have bibasilar rales as well as scattered expiratory wheezes and rhonchi. Chest is nontender. Heart has regular rate and rhythm without murmur. Abdomen is soft, flat, nontender. Extremities have 1+ edema, full range of motion is present. Skin is warm and dry without rash. Neurologic: Mental status is normal, cranial nerves are intact, moves all extremities equally s.  ED Results / Procedures / Treatments    Labs (all labs ordered are listed, but only abnormal results are displayed) Labs Reviewed  BRAIN NATRIURETIC PEPTIDE - Abnormal; Notable for the following components:      Result Value   B Natriuretic Peptide 800.5 (*)    All other components within normal limits  BASIC METABOLIC PANEL - Abnormal; Notable for the following components:   Sodium 134 (*)    Glucose, Bld 147 (*)    All other components within normal limits  CBC WITH DIFFERENTIAL/PLATELET - Abnormal; Notable for the following components:   RBC 3.12 (*)    Hemoglobin 8.8 (*)    HCT 28.3 (*)    RDW 19.2 (*)    All other components within normal limits  TROPONIN I (HIGH SENSITIVITY) - Abnormal; Notable for the following components:   Troponin I (High Sensitivity) 3,039 (*)    All other components within normal limits  HEPARIN LEVEL (UNFRACTIONATED)  APTT  TROPONIN I (HIGH SENSITIVITY)    EKG EKG Interpretation  Date/Time:  Friday October 03 2021 05:20:58 EST Ventricular Rate:  81 PR Interval:  179 QRS Duration: 143 QT Interval:  433 QTC Calculation: 503 R Axis:   62 Text Interpretation: Sinus rhythm Left bundle branch block When compared with ECG of 09/20/2021, No significant change was found Confirmed by Delora Fuel ([id number]) on 10/03/2021 5:24:32 AM  Radiology DG Chest Port 1 View  Result Date: 10/03/2021 CLINICAL DATA:  Short of breath. EXAM: PORTABLE CHEST 1 VIEW COMPARISON:  09/23/2021 FINDINGS: Previous median sternotomy and CABG procedure. Stable cardiomediastinal contours. Interval increase in bilateral interstitial and airspace opacities. No acute osseous findings. IMPRESSION: Worsening aeration to the lungs bilaterally compatible with progressive pulmonary edema versus multifocal infection. Electronically Signed   By: Kerby Moors M.D.   On: 10/03/2021 05:52    Procedures Procedures  Cardiac monitor, per my interpretation, shows sinus rhythm without ectopy.  Medications Ordered in ED Medications   morphine (PF) 4 MG/ML injection 4 mg (has no administration in time range)  ondansetron (ZOFRAN) injection 4 mg (has no administration in time range)  furosemide (LASIX) injection 40 mg (has no administration in time range)    ED Course/ Medical Decision Making/ A&P  Medical Decision Making Amount and/or Complexity of Data Reviewed Labs: ordered. Radiology: ordered.  Risk OTC drugs. Prescription drug management. Decision regarding hospitalization.   Shortness of breath which seems most likely to be an exacerbation of known heart failure.  Consider possibility of ACS.  However, some wheezing present suggest possible component of chronic bronchitis or asthma.  No cough to suggest pneumonia or influenza or COVID-19.  Old records reviewed confirming recent hospitalization for heart failure exacerbation and discharged on hospice care.  She will be given a dose of morphine to help with her sense of breathlessness, will also give intravenous furosemide.  Will check chest x-ray and screening labs including troponin and BNP.  ECG has left bundle branch block, no change from prior.  Chest x-ray shows worsening pulmonary edema.  I have independently viewed the image, and agree with the radiologist's interpretation.  Metabolic panel shows mild hyponatremia which is not felt to be clinically significant.  CBC shows stable anemia.  BNP is 800, which is significantly higher than it was previously, consistent with patient's clinical presentation.  Troponin has come back greater than 3000 consistent with non-STEMI.  Old records are reviewed and her recent hospitalization included an attempt at heart catheterization which was technically difficult which was part of the reason that she was placed on hospice.  Current plan would be no attempted PCI.  Therefore, we will arrange admission to medical service.  Aspirin was ordered, but patient is refusing because she has a solitary kidney.   She is placed on heparin.  Case is discussed with Dr. Hal Hope of [facility], who agrees to admit the patient.  Case is discussed with Dr. Harriet Masson of cardiology service who agrees to see the patient in consultation.  CRITICAL CARE Performed by: Delora Fuel Total critical care time: 50 minutes Critical care time was exclusive of separately billable procedures and treating other patients. Critical care was necessary to treat or prevent imminent or life-threatening deterioration. Critical care was time spent personally by me on the following activities: development of treatment plan with patient and/or surrogate as well as nursing, discussions with consultants, evaluation of patient's response to treatment, examination of patient, obtaining history from patient or surrogate, ordering and performing treatments and interventions, ordering and review of laboratory studies, ordering and review of radiographic studies, pulse oximetry and re-evaluation of patient's condition.       Final Clinical Impression(s) / ED Diagnoses Final diagnoses:  Acute on chronic diastolic heart failure (HCC)  Non-STEMI (non-ST elevated myocardial infarction) (HCC)  Hyponatremia  Normochromic normocytic anemia    Rx / DC Orders ED Discharge Orders     None         Delora Fuel, MD 83/33/83 [phone]

## 2021-10-03 NOTE — ED Notes (Signed)
ED TO INPATIENT HANDOFF REPORT  ED Nurse Name and Phone #: Diara Chaudhari RN [phone]  S Name/Age/Gender Katherine [name] 66 y.o. female Room/Bed: 036C/036C  Code Status   Code Status: DNR  Home/SNF/Other Home Patient oriented to: self, place, time, and situation Is this baseline? Yes   Triage Complete: Triage complete  Chief Complaint ACS (acute coronary syndrome) ([place]) [I24.9]  Triage Note Pt brought to ED via EMS from home with c/o shortness of breath. Pt states she was recently discharged from the hospital, currently hospice patient. EMS reports patient was 85% on baseline 5LNC, NRB applied and patient increased to 95%. Patient alert and oriented x 4 on arrival to ED, dysnea with exertion. MD present to assess patient on arrival.  EMS v/s: 84HR 111/62 BP   Allergies Allergies  Allergen Reactions   Tetracycline Hives    Patient tolerated Doxycycline Dec 2020   Niacin Other (See Comments)    Mouth blisters   Niaspan [Niacin Er] Other (See Comments)    Mouth blisters   Sulfa Antibiotics Nausea Only and Other (See Comments)    "Tears up stomach"   Sulfonamide Derivatives Other (See Comments)    Reaction: per patient "tears her stomach up"   Codeine Nausea And Vomiting   Erythromycin Nausea And Vomiting   Hydromorphone Hcl Nausea And Vomiting   Morphine And Related Nausea And Vomiting   Nalbuphine Nausea And Vomiting    Nubain   Sulfasalazine Nausea Only and Other (See Comments)    per patient "tears her stomach up", "Tears up stomach"   Tape Rash and Other (See Comments)    No "plastic" tape," please----cloth tape only    Level of Care/Admitting Diagnosis ED Disposition     ED Disposition  Discharge   Condition  --   [place]: [place] [[id number]] Level of Care: Telemetry Cardiac [103] May admit patient to [facility] or [facility] if equivalent level of care is available:: No Covid Evaluation: Recent COVID positive no  isolation required  infection day 21-90 Diagnosis: ACS (acute coronary syndrome) [facility]) [[id number]] Admitting Physician: Karmen Bongo [[id number]] Attending Physician: Karmen Bongo [[id number]] Estimated length of stay: 3 - 4 days Certification:: I certify this patient will  need inpatient services for at least 2 midnights          B Medical/Surgery History Past Medical History:  Diagnosis Date   Anemia    Anxiety    Bilateral carotid artery stenosis    Carotid duplex 12/8307: 4-07% LICA, 68-08% RICA, >81% RECA, f/u 1 yr suggested   CAD (coronary artery disease) of bypass graft 5/01; 3/'02, 8/'03, 10/'04; 1/15   PCI x 5 to SVG-D1    CAD in native artery 07/1993   3 Vessel Disease (LAD-D1 & RCA) -- CABG (Dx in setting of inferior STEMI-PTCA of RCA)   COPD mixed type ([place])    Followed by Dr. Lamonte Sakai "pulmonologist said no COPD"   Depression with anxiety    Diabetes mellitus type 2 in obese ([place])    Diarrhea    started after cholecystectomy and mass removed from intestine   Dyslipidemia, goal LDL below 70    08/2012: TC 137, TG 200, HDL 32!, LDL 45; on statin (followed by Dr.Deterding)   ESRD (end stage renal disease) ([place]) 1991   s/p Cadaveric Renal Transplant [facility] - Dr. 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  Had having having lots of nausea and vomiting with poor p.o. intake.  Thought to have QT prolongation with multiple medications involved and hypomagnesemia, hypokalemia.  Tikosyn discontinued along with Zoloft and Phenergan.   Hypertension associated with diabetes ([place])    Mild aortic stenosis by prior echocardiogram 07/2019   Echo:  Mild aortic stenosis (gradients:  Mean 14.3 mmHg -peak 24.9 mmHg).   Morbid obesity ([facility])    MRSA (methicillin resistant staph aureus) culture positive    OSA (obstructive sleep apnea)    no longer on CPAP or home O2, states she doesn't need now after lap band   PAD (peripheral artery disease) ([place]) 08/2013   LEA Dopplers to be read by Dr. Fletcher Anon   PAF (paroxysmal atrial fibrillation) ([place]) 06/2014   Noted on CardioNet Monitor  - --> rhythm control with Tikosyn (Dr. Rayann Heman); converted from warfarin to apixaban for anticoagulation.   Pneumonia    Recurrent boils    Bilateral Groin   Rheumatoid arthritis ([place])    Per Patient Report; associated with OA   S/p cadaver renal transplant 1991   [facility]   Past Surgical History:  Procedure Laterality Date   ABDOMINAL AORTOGRAM N/A 04/21/2018   Procedure: ABDOMINAL AORTOGRAM;  Surgeon: [name], MD;  Location: [place] [facility];  Service: Cardiovascular;  Laterality: N/A;   CATHETER REMOVAL     CHOLECYSTECTOMY N/A 10/29/2014   Procedure: LAPAROSCOPIC CHOLECYSTECTOMY WITH INTRAOPERATIVE CHOLANGIOGRAM;  Surgeon: Excell Seltzer, MD;  Location: [facility];  Service: General;  Laterality: N/A;   CORONARY ANGIOPLASTY  1994   x5   CORONARY ARTERY BYPASS GRAFT  1995   LIMA-LAD, SVG-RPDA, SVG-D1   CORONARY STENT INTERVENTION N/A 02/20/2021   Procedure: PERCUTANEOUS CORONARY STENT INTERVENTION;  Surgeon: [name], MD;  Location: [place] [facility]; ostLCx 60% (Neg RFR 0.96);; SVG- D2 recurrent 90% ISR & 95% native D2 after graft-> DES PCI of 95% anastomotic D2 lesion (Onyx Frontier 2.25 mm x 12 mm => 2.75 mm @ overlap, 2.5 distal.);PTCA of ISR in body of graft. ->  2.5 mm scoring balloon & post-dil w/ 2.75 mm [place] balloon   ESOPHAGOGASTRODUODENOSCOPY N/A 10/15/2016   Procedure: ESOPHAGOGASTRODUODENOSCOPY (EGD);  Surgeon: Wilford Corner, MD;  Location: [facility] ENDOSCOPY;  Service: Endoscopy;  Laterality: N/A;   I & D EXTREMITY Right 01/29/2018   Procedure: IRRIGATION AND  DEBRIDEMENT THUMB;  Surgeon: Dayna Barker, MD;  Location: [place];  Service: Plastics;  Laterality: Right;   INCISE AND DRAIN ABCESS     INTRAVASCULAR PRESSURE WIRE/FFR STUDY N/A 02/20/2021   Procedure: INTRAVASCULAR PRESSURE WIRE/FFR STUDY;  Surgeon: [name], MD;  Location: [place] [facility];  Service: Cardiovascular;  Laterality: N/A;   KIDNEY TRANSPLANT  1991   KNEE ARTHROSCOPY WITH LATERAL MENISECTOMY Left 12/03/2017   Procedure: LEFT KNEE ARTHROSCOPY WITH LATERAL MENISECTOMY;  Surgeon: Earlie Server, MD;  Location: [place];  Service: Orthopedics;  Laterality: Left;   LAPAROSCOPIC GASTRIC BANDING  04/2004; 10/'09, 2/'10   Port Replacement x 2   LEFT HEART CATH AND CORONARY ANGIOGRAPHY N/A 02/20/2021   Procedure: LEFT HEART CATH AND CORONARY ANGIOGRAPHY;  Surgeon: [name], MD;  Location: [place] [facility];  Service: Cardiovascular;  Laterality: N/A;   LEFT HEART CATH AND CORS/GRAFTS ANGIOGRAPHY N/A 04/21/2018   Procedure: LEFT HEART CATH AND CORS/GRAFTS ANGIOGRAPHY;  Surgeon: [name], MD;  Location: [place] [facility];  Ost-Prox LAD 50% - proxLAD (pre & post D1) 100% CTO. Cx - patent, small OM1 (stable ~ ostial OM1 90%, too small for PCI) & 2 small LPL; Ost-distal RCA 100% CTO.  LIMA-LAD (not injected); SVG-dRCA patent, SVG-D1 - insertion stent ~20% ISR - Severe R CFA disease w/ focal Sub TO   LEFT HEART CATH AND CORS/GRAFTS ANGIOGRAPHY  5/'01, 3/'02, 8/'03, 10/'04; 1/'15   08/22/2013: LAD & RCA 100%; LIMA-LAD & SVG-rPDA patent; Cx-- OM1 60%, OM2 ostial ~50%; SVG-D1 - 80% mid, 50% distal ISR --PCI   LEFT HEART CATH AND CORS/GRAFTS ANGIOGRAPHY N/A 01/31/2021   Procedure: LEFT HEART CATH AND CORS/GRAFTS ANGIOGRAPHY;  Surgeon: Jolaine Artist, MD;  Location: [place] [facility];;   LEFT HEART CATH AND CORS/GRAFTS ANGIOGRAPHY N/A 09/05/2021   Procedure: LEFT HEART CATH AND CORS/GRAFTS ANGIOGRAPHY;  Surgeon: [name], MD;  Location: [place];  Service:  Cardiovascular;  Laterality: N/A;   LEFT HEART CATH AND CORS/GRAFTS ANGIOGRAPHY N/A 08/21/2021   Procedure: LEFT HEART CATH AND CORS/GRAFTS ANGIOGRAPHY;  Surgeon: Belva Crome, MD;  Location: [place] [facility];  Service: Cardiovascular;  Laterality: N/A;   LEFT HEART CATHETERIZATION WITH CORONARY/GRAFT ANGIOGRAM N/A 08/23/2013   Procedure: LEFT HEART CATHETERIZATION WITH Beatrix Fetters;  Surgeon: Wellington Hampshire, MD;  Location: [facility] [facility] LAB;  Service: Cardiovascular;  Laterality: N/A;   Lower Extremity Arterial Dopplers  08/2013   ABI: R 0.96, L 1.04   MULTIPLE TOOTH EXTRACTIONS  age 28   NM MYOVIEW LTD  03/2016   EF 62%. LOW RISK. C/W prior MI - no Ischemia. Apical hypokinesis.   PERCUTANEOUS CORONARY STENT INTERVENTION (PCI-S)  5/'01, 3/'02, 8/'03, 10/'04;   '01 - S660 BMS 2.5 x 9 - dSVG-D1 into D1; '02- post-stent stenosis - 2.5 x 8 Pixel BMS; '8\03: ISR/Thrombosis into native D1 - AngioJet, 2.5 x 13 Pixel; '04 - ISR 95% - covered stented area with Taxus DES 2.5 mm x 20 (2.88)   PERCUTANEOUS CORONARY STENT INTERVENTION (PCI-S)  08/23/2013   Procedure: PERCUTANEOUS CORONARY STENT INTERVENTION (PCI-S);  Surgeon: Wellington Hampshire, MD;  Location: [facility] CATH LAB;  Service: Cardiovascular;;mid SVG-D1 80%; distal stent ~50% ISR; Promus [place] DES 2.75 mm xc 20 mm (2.8 mm)   PORT-A-CATH REMOVAL     kidney   TRANSTHORACIC ECHOCARDIOGRAM  07/2019   a) 07/2019: EF 55 to 60%.  No LVH.  Paradoxical septal WM-s/p CABG.  GRII DD.  Nl RV size and fxn.  Mild bilateral atrial dilation.  Mod MAC.  Trace MR.  Mild AS (gradients: Mean 14.3 mmHg -peak 24.9 mmHg).; B) 06/2020: EF 40 to 45%.  Moderate concentric LVH.  GRII DD.  Elevated LAP.  Mod HK mid Apical Ant-AntSept wall & mild Apical Dyskinesis.  Mod LA dilation.  Mild MR.  AoV sclerosis w/o AS.   TRANSTHORACIC ECHOCARDIOGRAM  01/30/2021   EF 55 to 60%.  Mild LVH.  GR 1 DD.  Elevated LAP.  Moderate LA dilation.  Mild MR with mild MS.  Mild aortic  valve stenosis.:   TUBAL LIGATION     wrist fistula repair Left    dialysis for one year     A IV Location/Drains/Wounds Patient Lines/Drains/Airways Status     Active Line/Drains/Airways     Name Placement  date Placement time Site Days   Peripheral IV 10/03/21 22 G Left Antecubital 10/03/21  0523  Antecubital  less than 1   External Urinary Catheter 09/20/21  1512  --  13            Intake/Output Last 24 hours No intake or output data in the 24 hours ending 10/03/21 2025  Labs/Imaging Results for orders placed or performed during the hospital encounter of 10/03/21 (from the past 48 hour(s))  Brain natriuretic peptide     Status: Abnormal   Collection Time: 10/03/21  5:20 AM  Result Value Ref Range   B Natriuretic Peptide 800.5 (H) 0.0 - 100.0 pg/mL    Comment: Performed at [facility] [address], [place], [place] [address]  Troponin I (High Sensitivity)     Status: Abnormal   Collection Time: 10/03/21  5:20 AM  Result Value Ref Range   Troponin I (High Sensitivity) 3,039 (HH) <18 ng/L    Comment: CRITICAL RESULT CALLED TO, READ BACK BY AND VERIFIED WITH: Biagio Borg RN 10/03/21 0618 M KOROLESKI (NOTE) Elevated high sensitivity troponin I (hsTnI) values and significant  changes across serial measurements may suggest ACS but many other  chronic and acute conditions are known to elevate hsTnI results.  Refer to the Links section for chest pain algorithms and additional  guidance. Performed at [facility], [place] [address] [place], [place] [address]   Basic metabolic panel     Status: Abnormal   Collection Time: 10/03/21  5:20 AM  Result Value Ref Range   Sodium 134 (L) 135 - 145 mmol/L   Potassium 3.8 3.5 - 5.1 mmol/L   Chloride 100 98 - 111 mmol/L   CO2 24 22 - 32 mmol/L   Glucose, Bld 147 (H) 70 - 99 mg/dL    Comment: Glucose reference range applies only to samples taken after fasting for at least 8 hours.   BUN 9 8 - 23 mg/dL   Creatinine,  Ser 0.98 0.44 - 1.00 mg/dL   Calcium 9.1 8.9 - 10.3 mg/dL   GFR, Estimated >60 >60 mL/min    Comment: (NOTE) Calculated using the CKD-EPI Creatinine Equation (2021)    Anion gap 10 5 - 15    Comment: Performed at [facility] [address], [place], [place] [address]  CBC with Differential     Status: Abnormal   Collection Time: 10/03/21  5:20 AM  Result Value Ref Range   WBC 10.1 4.0 - 10.5 K/uL   RBC 3.12 (L) 3.87 - 5.11 MIL/uL   Hemoglobin 8.8 (L) 12.0 - 15.0 g/dL   HCT 28.3 (L) 36.0 - 46.0 %   MCV 90.7 80.0 - 100.0 fL   MCH 28.2 26.0 - 34.0 pg   MCHC 31.1 30.0 - 36.0 g/dL   RDW 19.2 (H) 11.5 - 15.5 %   Platelets 230 150 - 400 K/uL   nRBC 0.0 0.0 - 0.2 %   Neutrophils Relative % 77 %   Neutro Abs 7.7 1.7 - 7.7 K/uL   Lymphocytes Relative 9 %   Lymphs Abs 0.9 0.7 - 4.0 K/uL   Monocytes Relative 9 %   Monocytes Absolute 1.0 0.1 - 1.0 K/uL   Eosinophils Relative 4 %   Eosinophils Absolute 0.4 0.0 - 0.5 K/uL   Basophils Relative 0 %   Basophils Absolute 0.0 0.0 - 0.1 K/uL   Immature Granulocytes 1 %   Abs Immature Granulocytes 0.07 0.00 -  0.07 K/uL    Comment: Performed at [facility], [place] [address] [place], [place] [address]  Troponin I (High Sensitivity)     Status: Abnormal   Collection Time: 10/03/21  7:05 AM  Result Value Ref Range   Troponin I (High Sensitivity) 3,032 (HH) <18 ng/L    Comment: CRITICAL VALUE NOTED.  VALUE IS CONSISTENT WITH PREVIOUSLY REPORTED AND CALLED VALUE. (NOTE) Elevated high sensitivity troponin I (hsTnI) values and significant  changes across serial measurements may suggest ACS but many other  chronic and acute conditions are known to elevate hsTnI results.  Refer to the Links section for chest pain algorithms and additional  guidance. Performed at [facility], [place] [address] [place], [place] [address]   CBG monitoring, ED     Status: Abnormal   Collection Time: 10/03/21 11:48 AM  Result Value Ref Range    Glucose-Capillary 184 (H) 70 - 99 mg/dL    Comment: Glucose reference range applies only to samples taken after fasting for at least 8 hours.   *Note: Due to a large number of results and/or encounters for the requested time period, some results have not been displayed. A complete set of results can be found in Results Review.   DG Chest Port 1 View  Result Date: 10/03/2021 CLINICAL DATA:  Short of breath. EXAM: PORTABLE CHEST 1 VIEW COMPARISON:  09/23/2021 FINDINGS: Previous median sternotomy and CABG procedure. Stable cardiomediastinal contours. Interval increase in bilateral interstitial and airspace opacities. No acute osseous findings. IMPRESSION: Worsening aeration to the lungs bilaterally compatible with progressive pulmonary edema versus multifocal infection. Electronically Signed   By: Kerby Moors M.D.   On: 10/03/2021 05:52    Pending Labs Unresulted Labs (From admission, onward)    None       Vitals/Pain Today's Vitals   10/03/21 1400 10/03/21 1600 10/03/21 1615 10/03/21 2000  BP:  107/76 (!) 96/51 91/62  Pulse:  75 82 92  Resp:  (!) 28 (!) 28 18  Temp:    (!) 100.5 F (38.1 C)  TempSrc:    Oral  SpO2:  100% 98% 95%  Weight:      Height:      PainSc: Asleep       Isolation Precautions No active isolations  Medications Medications  aspirin chewable tablet 324 mg (324 mg Oral Patient Refused/Not Given 10/03/21 7846)  isosorbide mononitrate (IMDUR) 24 hr tablet 60 mg (60 mg Oral Given 10/03/21 1023)  metoprolol tartrate (LOPRESSOR) tablet 25 mg (25 mg Oral Not Given 10/03/21 1630)  ranolazine (RANEXA) 12 hr tablet 500 mg (500 mg Oral Given 10/03/21 1024)  OLANZapine (ZYPREXA) tablet 5 mg (has no administration in time range)  sertraline (ZOLOFT) tablet 100 mg (100 mg Oral Given 10/03/21 1023)  predniSONE (DELTASONE) tablet 5 mg (5 mg Oral Patient Refused/Not Given 10/03/21 1229)  clopidogrel (PLAVIX) tablet 75 mg (75 mg Oral Given 10/03/21 1023)  azaTHIOprine (IMURAN)  tablet 125 mg (125 mg Oral Given 10/03/21 1023)  cyclobenzaprine (FLEXERIL) tablet 10 mg (has no administration in time range)  gabapentin (NEURONTIN) capsule 100 mg (has no administration in time range)  lamoTRIgine (LAMICTAL) tablet 25 mg (has no administration in time range)  albuterol (PROVENTIL) (2.5 MG/3ML) 0.083% nebulizer solution 2.5 mg (has no administration in time range)  arformoterol (BROVANA) nebulizer solution 15 mcg (15 mcg Nebulization Given 10/03/21 1048)  budesonide (PULMICORT) nebulizer solution 0.5 mg (0.5 mg Nebulization Given 10/03/21 1048)  loratadine (CLARITIN) tablet 10 mg (  10 mg Oral Given 10/03/21 1024)  fluticasone (FLONASE) 50 MCG/ACT nasal spray 2 spray (2 sprays Each Nare Not Given 10/03/21 1046)  montelukast (SINGULAIR) tablet 10 mg (has no administration in time range)  sodium chloride flush (NS) 0.9 % injection 3 mL (3 mLs Intravenous Given 10/03/21 1000)  acetaminophen (TYLENOL) tablet 650 mg (has no administration in time range)    Or  acetaminophen (TYLENOL) suppository 650 mg (has no administration in time range)  traZODone (DESYREL) tablet 25 mg (has no administration in time range)  docusate sodium (COLACE) capsule 100 mg (100 mg Oral Given 10/03/21 1024)  polyethylene glycol (MIRALAX / GLYCOLAX) packet 17 g (has no administration in time range)  bisacodyl (DULCOLAX) EC tablet 5 mg (has no administration in time range)  haloperidol (HALDOL) tablet 2 mg (has no administration in time range)    Or  haloperidol (HALDOL) 2 MG/ML solution 2 mg (has no administration in time range)    Or  haloperidol lactate (HALDOL) injection 2 mg (has no administration in time range)  glycopyrrolate (ROBINUL) tablet 1 mg (has no administration in time range)    Or  glycopyrrolate (ROBINUL) injection 0.2 mg (has no administration in time range)    Or  glycopyrrolate (ROBINUL) injection 0.2 mg (has no administration in time range)  antiseptic oral rinse (BIOTENE) solution 15 mL  (has no administration in time range)  polyvinyl alcohol (LIQUIFILM TEARS) 1.4 % ophthalmic solution 1 drop (has no administration in time range)  morphine CONCENTRATE 10 MG/0.5ML oral solution 10 mg (10 mg Oral Given 10/03/21 1919)  ondansetron (ZOFRAN-ODT) disintegrating tablet 4 mg (4 mg Oral Given 10/03/21 1919)  LORazepam (ATIVAN) 2 MG/ML concentrated solution 1 mg (has no administration in time range)  oxyCODONE (Oxy IR/ROXICODONE) immediate release tablet 5 mg (has no administration in time range)  morphine CONCENTRATE 10 MG/0.5ML oral solution 5-10 mg (has no administration in time range)  morphine (PF) 4 MG/ML injection 4 mg (4 mg Intravenous Given 10/03/21 0537)  ondansetron (ZOFRAN) injection 4 mg (4 mg Intravenous Given 10/03/21 0536)  furosemide (LASIX) injection 40 mg (40 mg Intravenous Given 10/03/21 0536)  morphine (PF) 2 MG/ML injection 2 mg (2 mg Intravenous Given 10/03/21 1300)  ondansetron (ZOFRAN) injection 4 mg (4 mg Intravenous Given 10/03/21 1300)    Mobility walks with device     Focused Assessments Cardiac Assessment Handoff:    Lab Results  Component Value Date   CKTOTAL 90 03/30/2021   CKMB 2.5 09/18/2010   TROPONINI <0.03 04/20/2018   Lab Results  Component Value Date   DDIMER 0.93 (H) 08/13/2021   Does the Patient currently have chest pain? No    R Recommendations: See Admitting Provider Note  Report given to: Ayesha Mohair RN  Additional Notes:

## 2021-10-03 NOTE — ED Notes (Signed)
Pharmacy contacted to request Semglee insulin and prednisone tablet. Pharmacy states they will tube medications.

## 2021-10-03 NOTE — Assessment & Plan Note (Addendum)
-  Recent echo with preserved EF, apical pseudoaneurysm; 01/2021 echo with grade 1 diastolic dysfunction -Acute CP and SOB were also likely related to CHF, possible flash pulmonary edema -CXR appears to show progressive pulmonary edema and BNP is elevated from prior -She was given Lasix in the ER, will continue with 40 mg IV BID -Unfortunately, given the circumstances, this is likely to be an ongoing issue

## 2021-10-03 NOTE — Progress Notes (Signed)
ANTICOAGULATION CONSULT NOTE - Initial Consult  Pharmacy Consult for heparin Indication: chest pain/ACS and atrial fibrillation  Allergies  Allergen Reactions   Tetracycline Hives    Patient tolerated Doxycycline Dec 2020   Niacin Other (See Comments)    Mouth blisters   Niaspan [Niacin Er] Other (See Comments)    Mouth blisters   Sulfa Antibiotics Nausea Only and Other (See Comments)    "Tears up stomach"   Sulfonamide Derivatives Other (See Comments)    Reaction: per patient "tears her stomach up"   Codeine Nausea And Vomiting   Erythromycin Nausea And Vomiting   Hydromorphone Hcl Nausea And Vomiting   Morphine And Related Nausea And Vomiting   Nalbuphine Nausea And Vomiting    Nubain   Sulfasalazine Nausea Only and Other (See Comments)    per patient "tears her stomach up", "Tears up stomach"   Tape Rash and Other (See Comments)    No "plastic" tape," please----cloth tape only    Patient Measurements: Height: 5\' 1"  (154.9 cm) Weight: 89.4 kg (197 lb 1.5 oz) IBW/kg (Calculated) : 47.8 Heparin Dosing Weight: 70kg  Vital Signs: BP: 113/77 (02/17 0615) Pulse Rate: 79 (02/17 0615)  Labs: Recent Labs    10/03/21 0520  HGB 8.8*  HCT 28.3*  PLT 230  CREATININE 0.98  TROPONINIHS 3,039*    Estimated Creatinine Clearance: 59 mL/min (by C-G formula based on SCr of 0.98 mg/dL).   Medical History: Past Medical History:  Diagnosis Date   Anemia    Anxiety    Bilateral carotid artery stenosis    Carotid duplex 10/6466: 0-32% LICA, 12-24% RICA, >82% RECA, f/u 1 yr suggested   CAD (coronary artery disease) of bypass graft 5/01; 3/'02, 8/'03, 10/'04; 1/15   PCI x 5 to SVG-D1    CAD in native artery 07/1993   3 Vessel Disease (LAD-D1 & RCA) -- CABG (Dx in setting of inferior STEMI-PTCA of RCA)   CAD S/P percutaneous coronary angioplasty    PCI to SVG-D1 insertion/native D1 x 4 = '01 -(S660 BMS 2.5 x 9 anastomosis- D1); '02 - distal overlap ACS Pixel 2.5 x 8  BMS; '03  distal/native ISR/Thrombosis - Pixel 2.5 x 13; '04 - ISR-  Taxus 2.5 x 20 (covered all);; 1/15 - mid SVG-D1 (50% distal ISR) - Promus P 2.75 x 20 -- 2.8 mm; 6/22: Extensive ISR PTCA & Anastomotic-Native Diag DES PCI ([place] Onyx 2.25x12 (2.75-2.5 mm post-dilation   COPD mixed type ([place])    Followed by Dr. Lamonte Sakai "pulmonologist said no COPD"   Depression with anxiety    Diabetes mellitus type 2 in obese ([place])    Diarrhea    started after cholecystectomy and mass removed from intestine   Dyslipidemia, goal LDL below 70    08/2012: TC 137, TG 200, HDL 32!, LDL 45; on statin (followed by Dr.Deterding)   ESRD (end stage renal disease) ([place]) 1991   s/p Cadaveric Renal Transplant ([facility] - Dr. Jimmy Footman)    Family history of adverse reaction to anesthesia    mom's bp dropped during/after anesthesia   Fibromyalgia    GERD (gastroesophageal reflux disease)    Glomerulonephritis, chronic, rapidly progressive 1989   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 07/1993   Rescue PTCA of RCA -- referred for CABG.   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  Had having having lots of nausea and vomiting with poor p.o. intake.  Thought to have QT prolongation with multiple medications involved and hypomagnesemia, hypokalemia.  Tikosyn discontinued along with Zoloft and Phenergan.   Hypertension associated with diabetes ([place])    Mild aortic stenosis by prior echocardiogram 07/2019   Echo:  Mild aortic stenosis (gradients: Mean 14.3 mmHg -peak 24.9 mmHg).   Morbid obesity ([facility])    MRSA (methicillin resistant staph aureus) culture positive    OSA (obstructive sleep apnea)    no longer on CPAP or home O2, states she  doesn't need now after lap band   PAD (peripheral artery disease) ([place]) 08/2013   LEA Dopplers to be read by Dr. Fletcher Anon   PAF (paroxysmal atrial fibrillation) ([place]) 06/2014   Noted on CardioNet Monitor  - --> rhythm control with Tikosyn (Dr. Rayann Heman); converted from warfarin to apixaban for anticoagulation.   Pneumonia    Recurrent boils    Bilateral Groin   Rheumatoid arthritis ([place])    Per Patient Report; associated with OA   S/p cadaver renal transplant 1991   [facility]   Torsades de pointes 05/12/2021   Unstable angina ([place]) 5/01; 3/'02, 8/'03, 10/'04; 1/15   x 5 occurences since Inf-Post STEMI in 1994    Assessment: 66yo female c/o progressive SOB, troponin found to be elevated, to begin heparin; pt is on Eliquis PTA for Afib, last dose taken 2/16 7p.  Goal of Therapy:  Heparin level 0.3-0.7 units/ml aPTT 66-102 seconds Monitor platelets by anticoagulation protocol: Yes   Plan:  Heparin infusion at 1000 units/hr. Monitor heparin levels, aPTT (while Eliquis affects anti-Xa labs), and CBC.  Wynona Neat, PharmD, BCPS  10/03/2021,6:33 AM

## 2021-10-03 NOTE — Care Management (Signed)
ED RNCM  met with Raynelle Dick RN [facility]) [phone]  concerning patient plan of care as per Dr. Lorin Mercy patient is coming in under comfort care.  Patient will continued to be follow by community if discharged  home with home hospice.  TOC will continue to follow

## 2021-10-03 NOTE — ED Notes (Signed)
Patient states she cannot take aspirin due to her kidney transplant. Dr. Roxanne Mins notified, medication returned to [facility].

## 2021-10-03 NOTE — Assessment & Plan Note (Signed)
-  Continue sertraline, lorazepam -Will add qhs trazodone prn

## 2021-10-03 NOTE — Assessment & Plan Note (Signed)
-  Patient has COPD and recent COVID infection with PNA -On home O2 -Currently on NRB O2 -Currently worse related to pulmonary edema

## 2021-10-03 NOTE — ED Notes (Signed)
Pharmacy called for morphine dose. Per pharmacy, will call back with secure code for tube station once it is ready.

## 2021-10-03 NOTE — Consult Note (Signed)
Cardiology Consultation:   Patient ID: Katherine [name] MRN: [id number]; DOB: 11/06/1955  Admit date: 10/03/2021 Date of Consult: 10/03/2021  PCP:  [name], MD   [facility] Providers Cardiologist:  [name], MD        Patient Profile:   Katherine [name] is a 66 y.o. female with a hx of severe coronary artery disease status post PCI and CABG in the past with most recent left heart catheterization in January 2023 at that time full discussion of medical management for coronary artery disease with shared decision not to pursue further PCI's in the future, diabetes mellitus, hyperlipidemia, ESRD, OSA who is being seen 10/03/2021 for the evaluation of elevated troponin at the request of Dr. Delora Fuel.  History of Present Illness:   Katherine [name] was hospitalized in January at that time she had unsuccessful PCI complicated by hematoma when she had decision for no longer pursuing coronary intervention, she was discharged home with home hospice.  The patient was brought in overnight from home due to severe chest pain and shortness of breath.  On presentation to the ED she was noted to have significantly elevated troponin.  Despite the fact that she is on hospice however the patient requested that cardiology see her during this hospitalization.  She has been admitted under hospitalist service.  She was seen and examined by me at her bedside in emergency department.   Past Medical History:  Diagnosis Date   Anemia    Anxiety    Bilateral carotid artery stenosis    Carotid duplex 08/8561: 1-49% LICA, 70-26% RICA, >37% RECA, f/u 1 yr suggested   CAD (coronary artery disease) of bypass graft 5/01; 3/'02, 8/'03, 10/'04; 1/15   PCI x 5 to SVG-D1    CAD in native artery 07/1993   3 Vessel Disease (LAD-D1 & RCA) -- CABG (Dx in setting of inferior STEMI-PTCA of RCA)   COPD mixed type ([place])    Followed by Dr. Lamonte Sakai "pulmonologist said no COPD"   Depression with anxiety    Diabetes  mellitus type 2 in obese ([place])    Diarrhea    started after cholecystectomy and mass removed from intestine   Dyslipidemia, goal LDL below 70    08/2012: TC 137, TG 200, HDL 32!, LDL 45; on statin (followed by Dr.Deterding)   ESRD (end stage renal disease) ([place]) 1991   s/p Cadaveric Renal Transplant [facility] - Dr. 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  Had having having lots of nausea and vomiting with poor p.o. intake.  Thought to have QT prolongation with multiple medications involved and hypomagnesemia, hypokalemia.  Tikosyn discontinued along with Zoloft and Phenergan.   Hypertension associated with diabetes ([place])    Mild aortic stenosis by prior echocardiogram 07/2019   Echo:  Mild aortic stenosis (gradients: Mean 14.3 mmHg -peak 24.9 mmHg).   Morbid obesity ([facility])    MRSA (methicillin resistant staph aureus) culture positive    OSA (obstructive sleep apnea)    no longer on CPAP or home O2, states she doesn't need now after lap band   PAD (peripheral artery  disease) ([place]) 08/2013   LEA Dopplers to be read by Dr. Fletcher Anon   PAF (paroxysmal atrial fibrillation) ([place]) 06/2014   Noted on CardioNet Monitor  - --> rhythm control with Tikosyn (Dr. Rayann Heman); converted from warfarin to apixaban for anticoagulation.   Pneumonia    Recurrent boils    Bilateral Groin   Rheumatoid arthritis ([place])    Per Patient Report; associated with OA   S/p cadaver renal transplant 1991   [facility]    Past Surgical History:  Procedure Laterality Date   ABDOMINAL AORTOGRAM N/A 04/21/2018   Procedure: ABDOMINAL AORTOGRAM;  Surgeon: [name], MD;  Location: [place] [facility];  Service:  Cardiovascular;  Laterality: N/A;   CATHETER REMOVAL     CHOLECYSTECTOMY N/A 10/29/2014   Procedure: LAPAROSCOPIC CHOLECYSTECTOMY WITH INTRAOPERATIVE CHOLANGIOGRAM;  Surgeon: Excell Seltzer, MD;  Location: [facility];  Service: General;  Laterality: N/A;   CORONARY ANGIOPLASTY  1994   x5   CORONARY ARTERY BYPASS GRAFT  1995   LIMA-LAD, SVG-RPDA, SVG-D1   CORONARY STENT INTERVENTION N/A 02/20/2021   Procedure: PERCUTANEOUS CORONARY STENT INTERVENTION;  Surgeon: [name], MD;  Location: [place] [facility]; ostLCx 60% (Neg RFR 0.96);; SVG- D2 recurrent 90% ISR & 95% native D2 after graft-> DES PCI of 95% anastomotic D2 lesion (Onyx Frontier 2.25 mm x 12 mm => 2.75 mm @ overlap, 2.5 distal.);PTCA of ISR in body of graft. ->  2.5 mm scoring balloon & post-dil w/ 2.75 mm [place] balloon   ESOPHAGOGASTRODUODENOSCOPY N/A 10/15/2016   Procedure: ESOPHAGOGASTRODUODENOSCOPY (EGD);  Surgeon: Wilford Corner, MD;  Location: [facility] ENDOSCOPY;  Service: Endoscopy;  Laterality: N/A;   I & D EXTREMITY Right 01/29/2018   Procedure: IRRIGATION AND DEBRIDEMENT THUMB;  Surgeon: Dayna Barker, MD;  Location: [place];  Service: Plastics;  Laterality: Right;   INCISE AND DRAIN ABCESS     INTRAVASCULAR PRESSURE WIRE/FFR STUDY N/A 02/20/2021   Procedure: INTRAVASCULAR PRESSURE WIRE/FFR STUDY;  Surgeon: [name], MD;  Location: [place] [facility];  Service: Cardiovascular;  Laterality: N/A;   KIDNEY TRANSPLANT  1991   KNEE ARTHROSCOPY WITH LATERAL MENISECTOMY Left 12/03/2017   Procedure: LEFT KNEE ARTHROSCOPY WITH LATERAL MENISECTOMY;  Surgeon: Earlie Server, MD;  Location: [place];  Service: Orthopedics;  Laterality: Left;   LAPAROSCOPIC GASTRIC BANDING  04/2004; 10/'09, 2/'10   Port Replacement x 2   LEFT HEART CATH AND CORONARY ANGIOGRAPHY N/A 02/20/2021   Procedure: LEFT HEART CATH AND CORONARY ANGIOGRAPHY;  Surgeon: [name], MD;  Location: [place] [facility];  Service: Cardiovascular;  Laterality:  N/A;   LEFT HEART CATH AND CORS/GRAFTS ANGIOGRAPHY N/A 04/21/2018   Procedure: LEFT HEART CATH AND CORS/GRAFTS ANGIOGRAPHY;  Surgeon: [name], MD;  Location: [place] [facility];  Ost-Prox LAD 50% - proxLAD (pre & post D1) 100% CTO. Cx - patent, small OM1 (stable ~ ostial OM1 90%, too small for PCI) & 2 small LPL; Ost-distal RCA 100% CTO.  LIMA-LAD (not injected); SVG-dRCA patent, SVG-D1 - insertion stent ~20% ISR - Severe R CFA disease w/ focal Sub TO   LEFT HEART CATH AND CORS/GRAFTS ANGIOGRAPHY  5/'01, 3/'02, 8/'03, 10/'04; 1/'15   08/22/2013: LAD & RCA 100%; LIMA-LAD & SVG-rPDA patent; Cx-- OM1 60%, OM2 ostial ~50%; SVG-D1 - 80% mid, 50% distal ISR --PCI   LEFT HEART CATH AND CORS/GRAFTS ANGIOGRAPHY N/A 01/31/2021   Procedure: LEFT HEART CATH AND CORS/GRAFTS ANGIOGRAPHY;  Surgeon: Jolaine Artist, MD;  Location: [place]  CV LAB;;   LEFT HEART CATH AND CORS/GRAFTS ANGIOGRAPHY N/A 09/05/2021   Procedure: LEFT HEART CATH AND CORS/GRAFTS ANGIOGRAPHY;  Surgeon: [name], MD;  Location: [place] CV [facility];  Service: Cardiovascular;  Laterality: N/A;   LEFT HEART CATH AND CORS/GRAFTS ANGIOGRAPHY N/A 08/21/2021   Procedure: LEFT HEART CATH AND CORS/GRAFTS ANGIOGRAPHY;  Surgeon: Belva Crome, MD;  Location: [place] CV [facility];  Service: Cardiovascular;  Laterality: N/A;   LEFT HEART CATHETERIZATION WITH CORONARY/GRAFT ANGIOGRAM N/A 08/23/2013   Procedure: LEFT HEART CATHETERIZATION WITH Beatrix Fetters;  Surgeon: Wellington Hampshire, MD;  Location: [place] CATH LAB;  Service: Cardiovascular;  Laterality: N/A;   Lower Extremity Arterial Dopplers  08/2013   ABI: R 0.96, L 1.04   MULTIPLE TOOTH EXTRACTIONS  age 7   NM MYOVIEW LTD  03/2016   EF 62%. LOW RISK. C/W prior MI - no Ischemia. Apical hypokinesis.   PERCUTANEOUS CORONARY STENT INTERVENTION (PCI-S)  5/'01, 3/'02, 8/'03, 10/'04;   '01 - S660 BMS 2.5 x 9 - dSVG-D1 into D1; '02- post-stent stenosis - 2.5 x 8 Pixel BMS; '8\03:  ISR/Thrombosis into native D1 - AngioJet, 2.5 x 13 Pixel; '04 - ISR 95% - covered stented area with Taxus DES 2.5 mm x 20 (2.88)   PERCUTANEOUS CORONARY STENT INTERVENTION (PCI-S)  08/23/2013   Procedure: PERCUTANEOUS CORONARY STENT INTERVENTION (PCI-S);  Surgeon: Wellington Hampshire, MD;  Location: [facility] CATH LAB;  Service: Cardiovascular;;mid SVG-D1 80%; distal stent ~50% ISR; Promus [place] DES 2.75 mm xc 20 mm (2.8 mm)   PORT-A-CATH REMOVAL     kidney   TRANSTHORACIC ECHOCARDIOGRAM  07/2019   a) 07/2019: EF 55 to 60%.  No LVH.  Paradoxical septal WM-s/p CABG.  GRII DD.  Nl RV size and fxn.  Mild bilateral atrial dilation.  Mod MAC.  Trace MR.  Mild AS (gradients: Mean 14.3 mmHg -peak 24.9 mmHg).; B) 06/2020: EF 40 to 45%.  Moderate concentric LVH.  GRII DD.  Elevated LAP.  Mod HK mid Apical Ant-AntSept wall & mild Apical Dyskinesis.  Mod LA dilation.  Mild MR.  AoV sclerosis w/o AS.   TRANSTHORACIC ECHOCARDIOGRAM  01/30/2021   EF 55 to 60%.  Mild LVH.  GR 1 DD.  Elevated LAP.  Moderate LA dilation.  Mild MR with mild MS.  Mild aortic valve stenosis.:   TUBAL LIGATION     wrist fistula repair Left    dialysis for one year      Inpatient Medications: Scheduled Meds:  arformoterol  15 mcg Nebulization BID   aspirin  324 mg Oral Once   azaTHIOprine  125 mg Oral Daily   budesonide  2 mL Nebulization BID   clopidogrel  75 mg Oral Daily   docusate sodium  100 mg Oral BID   fluticasone  2 spray Each Nare Daily   gabapentin  100 mg Oral QHS   insulin aspart  0-9 Units Subcutaneous TID WC   insulin glargine-yfgn  12 Units Subcutaneous QAC breakfast   isosorbide mononitrate  60 mg Oral Daily   lamoTRIgine  25 mg Oral QHS   loratadine  10 mg Oral Daily   metoprolol tartrate  25 mg Oral TID   montelukast  10 mg Oral QHS    morphine injection  2 mg Intravenous Once   morphine CONCENTRATE  10 mg Oral Q6H   OLANZapine  5 mg Oral QHS   ondansetron (ZOFRAN) IV  4 mg Intravenous Once   ondansetron  4  mg Oral Q8H   predniSONE  5 mg Oral Q breakfast   ranolazine  500 mg Oral BID   sertraline  100 mg Oral Daily   sodium chloride flush  3 mL Intravenous Q12H   Continuous Infusions:  PRN Meds: acetaminophen **OR** acetaminophen, albuterol, antiseptic oral rinse, bisacodyl, cyclobenzaprine, glycopyrrolate **OR** glycopyrrolate **OR** glycopyrrolate, haloperidol **OR** haloperidol **OR** haloperidol lactate, LORazepam, morphine CONCENTRATE, ondansetron **OR** ondansetron (ZOFRAN) IV, oxyCODONE, polyethylene glycol, polyvinyl alcohol, traZODone  Allergies:    Allergies  Allergen Reactions   Tetracycline Hives    Patient tolerated Doxycycline Dec 2020   Niacin Other (See Comments)    Mouth blisters   Niaspan [Niacin Er] Other (See Comments)    Mouth blisters   Sulfa Antibiotics Nausea Only and Other (See Comments)    "Tears up stomach"   Sulfonamide Derivatives Other (See Comments)    Reaction: per patient "tears her stomach up"   Codeine Nausea And Vomiting   Erythromycin Nausea And Vomiting   Hydromorphone Hcl Nausea And Vomiting   Morphine And Related Nausea And Vomiting   Nalbuphine Nausea And Vomiting    Nubain   Sulfasalazine Nausea Only and Other (See Comments)    per patient "tears her stomach up", "Tears up stomach"   Tape Rash and Other (See Comments)    No "plastic" tape," please----cloth tape only    Social History:   Social History   Socioeconomic History   Marital status: Widowed    Spouse name: Not on file   Number of children: Not on file   Years of education: Not on file   Highest education level: Not on file  Occupational History   Not on file  Tobacco Use   Smoking status: Every Day    Packs/day: 1.00    Years: 30.00    Pack years: 30.00    Types: Cigarettes    Last attempt to quit: 08/17/2002    Years since quitting: 19.1   Smokeless tobacco: Never  Vaping Use   Vaping Use: Never used  Substance and Sexual Activity   Alcohol use: No   Drug use:  No   Sexual activity: Not on file  Other Topics Concern   Not on file  Social History Narrative   ** Merged History Encounter ** She is currently married, and the caregiver of her husband who is recovering from surgery for tongue cancer now diagnosed with lung cancer. Prior to his diagnosis of her husband, she actually had adopted a 37-year-old child who she knows caring for as well. With all the surgeries, they have been quite financially troubled. Thanks the help of her community and church, they have been able to stay "alfoat."     She is a former smoker who quit in 2004 after a 30-pack-year history.   She is active chasing a 7-year-old child, does not do routine exercise. She's been quite depressed with the condition of her husband, and admits to eating comfort herself.   She does not drink alcohol.      05/13/2021 Patient reports that her husband is deceased. She is grieving the loss of her husband and the decline in her physical independence.    Social Determinants of Health   Financial Resource Strain: Not on file  Food Insecurity: No Food Insecurity   Worried About Charity fundraiser in the Last Year: Never true   Ran Out of Food in the Last Year: Never true  Transportation Needs: No Transportation Needs   Lack of  Transportation (Medical): No   Lack of Transportation (Non-Medical): No  Physical Activity: Not on file  Stress: Not on file  Social Connections: Not on file  Intimate Partner Violence: Not on file    Family History:    Family History  Problem Relation Age of Onset   Cancer Mother        liver   Heart disease Father    Cancer Father        colon   Arrhythmia Brother        Atrial Fibrillation   Arrhythmia Paternal Aunt        Atrial Fibrillation     ROS:  Review of systems review of Systems  Constitution: Negative for decreased appetite, fever and weight gain.  HENT: Negative for congestion, ear discharge, hoarse voice and sore throat.   Eyes: Negative  for discharge, redness, vision loss in right eye and visual halos.  Cardiovascular: Reports chest pain, dyspnea on exertion, leg swelling.  Negative for orthopnea and palpitations.  Respiratory: Negative for cough, hemoptysis, shortness of breath and snoring.   Endocrine: Negative for heat intolerance and polyphagia.  Hematologic/Lymphatic: Negative for bleeding problem. Does not bruise/bleed easily.  Skin: Negative for flushing, nail changes, rash and suspicious lesions.  Musculoskeletal: Negative for arthritis, joint pain, muscle cramps, myalgias, neck pain and stiffness.  Gastrointestinal: Negative for abdominal pain, bowel incontinence, diarrhea and excessive appetite.  Genitourinary: Negative for decreased libido, genital sores and incomplete emptying.  Neurological: Negative for brief paralysis, focal weakness, headaches and loss of balance.  Psychiatric/Behavioral: Negative for altered mental status, depression and suicidal ideas.  Allergic/Immunologic: Negative for HIV exposure and persistent infections.    Physical Exam/Data:   Vitals:   10/03/21 1030 10/03/21 1037 10/03/21 1045 10/03/21 1100  BP: 103/71  115/87 106/69  Pulse: 76 73 70 71  Resp:  (!) 25 (!) 24 (!) 26  Temp:      TempSrc:      SpO2:  98% 97% 92%  Weight:      Height:       No intake or output data in the 24 hours ending 10/03/21 1257 Last 3 Weights 10/03/2021 09/24/2021 09/23/2021  Weight (lbs) 197 lb 1.5 oz 197 lb 208 lb 9.6 oz  Weight (kg) 89.4 kg 89.359 kg 94.62 kg     Body mass index is 37.24 kg/m.  General:  Well nourished, well developed, in no acute distress HEENT: normal Neck: no JVD Vascular: No carotid bruits; Distal pulses 2+ bilaterally Cardiac:  normal S1, S2; RRR; no murmur  Lungs:  clear to auscultation bilaterally, no wheezing, rhonchi or rales  Abd: soft, nontender, no hepatomegaly  Ext: no edema Musculoskeletal:  No deformities, BUE and BLE strength normal and equal Skin: warm and dry   Neuro:  CNs 2-12 intact, no focal abnormalities noted Psych:  Normal affect   EKG:  The EKG was personally reviewed and demonstrates:   Telemetry:  Telemetry was personally reviewed and demonstrates: Sinus rhythm  Relevant CV Studies:   Laboratory Data:  High Sensitivity Troponin:   Recent Labs  Lab 09/20/21 2045 09/21/21 0752 09/21/21 0947 10/03/21 0520 10/03/21 0705  TROPONINIHS 563* 461* 400* 3,039* 3,032*     Chemistry Recent Labs  Lab 10/03/21 0520  NA 134*  K 3.8  CL 100  CO2 24  GLUCOSE 147*  BUN 9  CREATININE 0.98  CALCIUM 9.1  GFRNONAA >60  ANIONGAP 10    No results for input(s): PROT, ALBUMIN, AST, ALT, ALKPHOS,  BILITOT in the last 168 hours. Lipids No results for input(s): CHOL, TRIG, HDL, LABVLDL, LDLCALC, CHOLHDL in the last 168 hours.  Hematology Recent Labs  Lab 10/03/21 0520  WBC 10.1  RBC 3.12*  HGB 8.8*  HCT 28.3*  MCV 90.7  MCH 28.2  MCHC 31.1  RDW 19.2*  PLT 230   Thyroid No results for input(s): TSH, FREET4 in the last 168 hours.  BNP Recent Labs  Lab 10/03/21 0520  BNP 800.5*    DDimer No results for input(s): DDIMER in the last 168 hours.   Radiology/Studies:  DG Chest Port 1 View  Result Date: 10/03/2021 CLINICAL DATA:  Short of breath. EXAM: PORTABLE CHEST 1 VIEW COMPARISON:  09/23/2021 FINDINGS: Previous median sternotomy and CABG procedure. Stable cardiomediastinal contours. Interval increase in bilateral interstitial and airspace opacities. No acute osseous findings. IMPRESSION: Worsening aeration to the lungs bilaterally compatible with progressive pulmonary edema versus multifocal infection. Electronically Signed   By: Kerby Moors M.D.   On: 10/03/2021 05:52     Assessment and Plan:   ACS/NSTEMI-she has had prior CABG and PCI's.  She had recent unsuccessful PCI with shared decision on care team involved in the patient to transition to hospice care and continue medical management.  She is back with chest pain  which has thankfully improved since started on heparin drip in the ED.  And we will continue her medical management with Plavix Imdur and Ranexa.  Palliative care consult has been started as well.  I spoke with the patient agreed that she understands that we are moving forward with medical management and no plan for any invasive procedures.  She is agreeable with this.  Acute on chronic diastolic heart failure-clinical exam suggesting fluid overload.  Agree with Lasix 40 mg twice daily.  Continue to monitor electrolytes with this as well. Chronic respiratory failure with hypoxia she is requiring nonrebreather hopefully will be titrated off down to nasal cannula. Type 2 diabetes managed by the primary team Paroxysmal atrial fibrillation-continue with beta-blocker, on heparin drip hold Eliquis for now. Blood pressure acceptable no changes will be made.    Risk Assessment/Risk Scores:     HEAR Score (for undifferentiated chest pain):     New York Heart Association (NYHA) Functional Class NYHA Class III  CHA2DS2-VASc Score = 5   This indicates a 7.2% annual risk of stroke. The patient's score is based upon: CHF History: 1 HTN History: 1 Diabetes History: 1 Stroke History: 0 Vascular Disease History: 1 Age Score: 0 Gender Score: 1       For questions or updates, please contact [facility] Please consult [email or web address] for contact info under    Signed, Berniece Salines, DO  10/03/2021 12:57 PM

## 2021-10-03 NOTE — Assessment & Plan Note (Addendum)
-  Her last A1c was 7.9 -Tight glycemic control is unlikely to benefit the patient and may lead to hypoglycemia -Continue glargine -Hold Amaryl and metformin -Will cover with sensitive-scale SSI without qhs coverage

## 2021-10-03 NOTE — Assessment & Plan Note (Signed)
-  Continue Lopressor -Will add prn IV hydralazine

## 2021-10-03 NOTE — ED Notes (Signed)
Hospice RN at bedside talking with pt.

## 2021-10-03 NOTE — Consult Note (Signed)
Consultation Note Date: 10/03/2021   Patient Name: Katherine [name]  DOB: 13-Feb-1956  MRN: [id number]  Age / Sex: 66 y.o., female  PCP: [name], MD Referring Physician: Karmen Bongo, MD  Reason for Consultation: Establishing goals of care  HPI/Patient Profile: 66 y.o. female  with past medical history of severe CAD s/p stents and CABG, COPD on home O2, DM, HLD, ESRD, HTN, OSA not on CPAP, afib, and RA presented to ED on 10/03/21 with complaints of shortness of breath. Of note, patient has had 6 admissions in the last 6 months with a 30 day readmission - she is familiar to PMT from previous hospitalizations. She was last admitted 2/4-2/10/23 for NSTEMI and discharged with [employer] [facility]ospice. Patient was admitted on 10/03/2021 with ACS, acute on chronic diastolic CHF, chronic respiratory failure with hypoxia, paroxysmal atrial fibrillation.   Clinical Assessment and Goals of Care: I have reviewed medical records including EPIC notes, labs, and imaging. Received report from primary RN - no acute concerns. RN reports patient is no longer tolerating [place] and is on non-rebreather.   Went to visit patient at bedside - no family/visitors present. Patient was lying in bed asleep - she easily wakes to voice/gentle touch. She is oriented and able to participate in conversation. No signs or non-verbal gestures of pain or discomfort noted. No respiratory distress, increased work of breathing, or secretions noted. She is on non-rebreather. Patient states she feel "very tired and short of breath."  Met with patient  to discuss diagnosis, prognosis, GOC, EOL wishes, disposition, and options.  I re-introduced Palliative Medicine as specialized medical care for people living with serious illness. It focuses on providing relief from the symptoms and stress of a serious illness. The goal is to improve quality of  life for both the patient and the family.  We discussed a brief interval history since her previous admission. She confirms hospice staff have been coming to her home. Hospice RN/Camille called patient during my visit and I was able to discuss events of yesterday with patient and RN. Patient began having increasing shortness of breath - hospice staff ordered STAT oxygen concentrator as well as morphine and zofran. Unfortunately, these items did not arrive before patient felt she needed to call 911. Patient is living with her brother/Terry Badami - she was able to tell me his phone number [phone]. Environmental consultant provided contact information of [phone].  Discussed goals of care important to the patient. The difference between aggressive medical intervention and comfort care was considered in light of the patient's goals of care. Patient became understandably tearful - therapeutic listening and emotional support provided as she expressed her thoughts and fears around dying and the dying process. She reflects on her recurrent hospitalizations and decline in functional status in context of her chronic diseases. She understands she is not a candidate for invasive procedures. Natural trajectory at EOL was reviewed. Symptom management at EOL was reviewed. Patient's goal is to return home with hospice as soon  as possible if her symptoms can be managed. We discussed the possibility of residential hospice for increased symptom management - she is clear she does not want residential hospice and prefers to try and return home.  We talked about transition to comfort measures in house and what that would entail inclusive of medications to control pain, dyspnea, agitation, nausea, and itching. We discussed stopping all unnecessary measures such as blood draws, needle sticks, oxygen, antibiotics, CBGs/insulin, cardiac monitoring, IVF, and frequent vital signs. Patient is agreeable for transition  to full comfort care today - she would like to continue CBG/insulin as well as her home medications at this time.  Symptom management plan reviewed as well as discussed ensuring oxygen concentrator and comfort medications had arrived to her home before discharge.   Offered chaplain support - patient declined.  Visit also consisted of discussions dealing with the complex and emotionally intense issues of symptom management and palliative care in the setting of serious and potentially life-threatening illness. Palliative care team will continue to support patient, patient's family, and medical team.  Discussed with patient the importance of continued conversation with family and the medical providers regarding overall plan of care and treatment options, ensuring decisions are within the context of the patients values and GOCs.    Questions and concerns were addressed. The patient/family was encouraged to call with questions and/or concerns. PMT card was provided.  Discussed case with TOC, Dr Lorin Mercy, primary RN. TOC spoke with patient's brother who confirms oxygen concentrator has been delivered to the home. I personally called and spoke with [employer] about symptom management plan - she will call patient's brother to notify him to pick up medication from pharmacy. She confirms oxygen concentrator max is 10L and hospice RN can visit patient tonight if she is discharged today.   Notified that patient's brother has picked up morphine from pharmacy.   Discussed case with EDP/Dr. Billy Fischer via phone.   Later notified that patient will not be discharging home today - plan for admission for comfort care.  Primary Decision Maker: PATIENT    SUMMARY OF RECOMMENDATIONS   Initiated full comfort measures Continue DNR/DNI as previously documented Patient's goal is to return home with hospice as soon as possible if her symptoms can be managed - oxygen concentrator and  comfort medications are confirmed at patient's home  Added orders for EOL symptom management and to reflect full comfort measures, as well as discontinued orders that were not focused on comfort 65m IV morphine once for dyspnea and 441mIV zofran once to prevent nausea - allow 30 minutes and decrease oxygen to 10L O2 [place]. RN to monitor if patient tolerates well/without distress. If patient tolerates well, recommend discharge home. Hospice RN can visit patient after discharge Scheduled morphine concentrate 1046mO q6h for dyspnea along with PRN doses for breakthrough pain/dyspnea  Scheduled zofran ODT q8h for mild morphine intolerance of nausea/vomiting Continue home meds per patient request Unrestricted visitation orders were placed per current [facility] visitation policy  Nursing to provide frequent assessments and administer PRN medications as clinically necessary to ensure EOL comfort PMT will continue to follow and support holistically  Code Status/Advance Care Planning: DNR   Palliative Prophylaxis:  Aspiration, Bowel Regimen, Delirium Protocol, Frequent Pain Assessment, Oral Care, and Turn Reposition  Additional Recommendations (Limitations, Scope, Preferences): Full Comfort Care  Psycho-social/Spiritual:  Desire for further Chaplaincy support:no Created space and opportunity for patient to express thoughts and feelings regarding patient's  current medical situation.  Emotional support and therapeutic listening provided.  Prognosis:  < 2 weeks   Discharge Planning: Home with Hospice      Primary Diagnoses: Present on Admission:  ACS (acute coronary syndrome) ([place])  Type 2 diabetes mellitus with hyperlipidemia (HCC)  Renal transplant disorder  Paroxysmal atrial fibrillation (HCC); CHA2DS2VASc score F, HTN, CAD, CVA = 5  Essential hypertension  Acute on chronic diastolic CHF (congestive heart failure) (HCC)  Chronic respiratory failure with hypoxia (HCC)   Anxiety and depression   I have reviewed the medical record, interviewed the patient and family, and examined the patient. The following aspects are pertinent.  Past Medical History:  Diagnosis Date   Anemia    Anxiety    Bilateral carotid artery stenosis    Carotid duplex 08/9377: 0-24% LICA, 09-73% RICA, >53% RECA, f/u 1 yr suggested   CAD (coronary artery disease) of bypass graft 5/01; 3/'02, 8/'03, 10/'04; 1/15   PCI x 5 to SVG-D1    CAD in native artery 07/1993   3 Vessel Disease (LAD-D1 & RCA) -- CABG (Dx in setting of inferior STEMI-PTCA of RCA)   COPD mixed type ([place])    Followed by Dr. Lamonte Sakai "pulmonologist said no COPD"   Depression with anxiety    Diabetes mellitus type 2 in obese ([place])    Diarrhea    started after cholecystectomy and mass removed from intestine   Dyslipidemia, goal LDL below 70    08/2012: TC 137, TG 200, HDL 32!, LDL 45; on statin (followed by Dr.Deterding)   ESRD (end stage renal disease) ([place]) 1991   s/p Cadaveric Renal Transplant [facility] - Dr. 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  Had having having lots of nausea and vomiting with poor p.o. intake.  Thought to have QT prolongation with multiple medications involved and hypomagnesemia, hypokalemia.  Tikosyn discontinued along with Zoloft and Phenergan.   Hypertension associated with diabetes ([place])    Mild aortic stenosis by prior echocardiogram 07/2019   Echo:  Mild aortic stenosis (gradients: Mean 14.3 mmHg -peak 24.9 mmHg).   Morbid obesity (HCC)    MRSA (methicillin resistant staph aureus) culture positive    OSA (obstructive sleep apnea)    no longer on CPAP or home O2, states she  doesn't need now after lap band   PAD (peripheral artery disease) ([place]) 08/2013   LEA Dopplers to be read by Dr. Fletcher Anon   PAF (paroxysmal atrial fibrillation) ([place]) 06/2014   Noted on CardioNet Monitor  - --> rhythm control with Tikosyn (Dr. Rayann Heman); converted from warfarin to apixaban for anticoagulation.   Pneumonia    Recurrent boils    Bilateral Groin   Rheumatoid arthritis ([place])    Per Patient Report; associated with OA   S/p cadaver renal transplant 1991   [facility]   Social History   Socioeconomic History   Marital status: Widowed    Spouse name: Not on file   Number of children: Not on file   Years of education: Not on file   Highest education level: Not on file  Occupational History   Not on  file  Tobacco Use   Smoking status: Every Day    Packs/day: 1.00    Years: 30.00    Pack years: 30.00    Types: Cigarettes    Last attempt to quit: 08/17/2002    Years since quitting: 19.1   Smokeless tobacco: Never  Vaping Use   Vaping Use: Never used  Substance and Sexual Activity   Alcohol use: No   Drug use: No   Sexual activity: Not on file  Other Topics Concern   Not on file  Social History Narrative   ** Merged History Encounter ** She is currently married, and the caregiver of her husband who is recovering from surgery for tongue cancer now diagnosed with lung cancer. Prior to his diagnosis of her husband, she actually had adopted a 63-year-old child who she knows caring for as well. With all the surgeries, they have been quite financially troubled. Thanks the help of her community and church, they have been able to stay "alfoat."     She is a former smoker who quit in 2004 after a 30-pack-year history.   She is active chasing a 25-year-old child, does not do routine exercise. She's been quite depressed with the condition of her husband, and admits to eating comfort herself.   She does not drink alcohol.      05/13/2021 Patient reports that her husband is deceased. She is  grieving the loss of her husband and the decline in her physical independence.    Social Determinants of Health   Financial Resource Strain: Not on file  Food Insecurity: No Food Insecurity   Worried About Charity fundraiser in the Last Year: Never true   Ran Out of Food in the Last Year: Never true  Transportation Needs: No Transportation Needs   Lack of Transportation (Medical): No   Lack of Transportation (Non-Medical): No  Physical Activity: Not on file  Stress: Not on file  Social Connections: Not on file   Family History  Problem Relation Age of Onset   Cancer Mother        liver   Heart disease Father    Cancer Father        colon   Arrhythmia Brother        Atrial Fibrillation   Arrhythmia Paternal Aunt        Atrial Fibrillation   Scheduled Meds:  arformoterol  15 mcg Nebulization BID   aspirin  324 mg Oral Once   azaTHIOprine  125 mg Oral Daily   budesonide  2 mL Nebulization BID   clopidogrel  75 mg Oral Daily   docusate sodium  100 mg Oral BID   fluticasone  2 spray Each Nare Daily   furosemide  40 mg Intravenous BID   gabapentin  100 mg Oral QHS   insulin aspart  0-9 Units Subcutaneous TID WC   insulin glargine-yfgn  12 Units Subcutaneous QAC breakfast   isosorbide mononitrate  60 mg Oral Daily   lamoTRIgine  25 mg Oral QHS   loratadine  10 mg Oral Daily   LORazepam  0.5 mg Oral BID   metoprolol tartrate  25 mg Oral TID   montelukast  10 mg Oral QHS   OLANZapine  5 mg Oral QHS   pantoprazole  40 mg Oral Daily   predniSONE  5 mg Oral Q breakfast   ranolazine  500 mg Oral BID   rosuvastatin  20 mg Oral Daily   sertraline  100 mg Oral  Daily   sodium chloride flush  3 mL Intravenous Q12H   Continuous Infusions:  heparin 1,000 Units/hr (10/03/21 0705)   PRN Meds:.acetaminophen **OR** acetaminophen, albuterol, bisacodyl, cyclobenzaprine, hydrALAZINE, ondansetron **OR** ondansetron (ZOFRAN) IV, oxyCODONE, polyethylene glycol, traZODone Medications Prior  to Admission:  Prior to Admission medications   Medication Sig Start Date End Date Taking? Authorizing Provider  albuterol (VENTOLIN HFA) 108 (90 Base) MCG/ACT inhaler TAKE 2 PUFFS BY MOUTH EVERY 6 HOURS AS NEEDED FOR WHEEZE OR SHORTNESS OF BREATH Patient taking differently: 2 puffs every 6 (six) hours as needed for wheezing or shortness of breath. 02/10/21  Yes Byrum, Rose Fillers, MD  apixaban (ELIQUIS) 5 MG TABS tablet Take 1 tablet (5 mg total) by mouth 2 (two) times daily. APPOINTMENT NEEDED WITH CARDIOLOGIST FOR FURTHER REFILLS Patient taking differently: Take 5 mg by mouth 2 (two) times daily. 04/17/21  Yes Sherran Needs, NP  arformoterol (BROVANA) 15 MCG/2ML NEBU Take 2 mLs (15 mcg total) by nebulization 2 (two) times daily. 08/30/19  Yes Collene Gobble, MD  azaTHIOprine (IMURAN) 50 MG tablet Take 125 mg by mouth See admin instructions. Take 2 1/2 tablets (125 mg) by mouth daily at 3pm   Yes [provider]  budesonide (PULMICORT) 0.5 MG/2ML nebulizer solution Take 2 mLs (0.5 mg total) by nebulization 2 (two) times daily. 08/24/19  Yes [name], MD  cetirizine (ZYRTEC) 5 MG tablet Take 5 mg by mouth daily.   Yes [provider]  clopidogrel (PLAVIX) 75 MG tablet Take 1 tablet (75 mg total) by mouth daily. 02/22/21  Yes Cheryln Manly, NP  cyclobenzaprine (FLEXERIL) 10 MG tablet Take 1 tablet (10 mg total) by mouth 3 (three) times daily as needed for muscle spasms. 08/25/21  Yes Memon, Jolaine Artist, MD  fluticasone (FLONASE) 50 MCG/ACT nasal spray PLACE 2 SPRAYS INTO BOTH NOSTRILS 2 TIMES DAILY. Patient taking differently: 2 sprays in the morning and at bedtime. 06/27/21  Yes Collene Gobble, MD  furosemide (LASIX) 40 MG tablet Take 1 tablet (40 mg total) by mouth daily. 09/27/21 10/27/21 Yes Arrien, Jimmy Picket, MD  gabapentin (NEURONTIN) 100 MG capsule Take 1 capsule (100 mg total) by mouth at bedtime. 09/11/21  Yes Hosie Poisson, MD  glimepiride (AMARYL) 4 MG tablet Take 4  mg by mouth daily. 02/03/20  Yes [provider]  insulin aspart (NOVOLOG FLEXPEN) 100 UNIT/ML FlexPen Inject 4-10 Units into the skin 3 (three) times daily with meals. Per sliding scale :not provided   Yes [provider]  Insulin Glargine (BASAGLAR KWIKPEN) 100 UNIT/ML Inject 20 Units into the skin daily before breakfast. Patient taking differently: Inject 12 Units into the skin daily before breakfast. 08/25/21  Yes Kathie Dike, MD  isosorbide mononitrate (IMDUR) 60 MG 24 hr tablet Take 1 tablet (60 mg total) by mouth daily. 09/27/21 10/27/21 Yes Arrien, Jimmy Picket, MD  lamoTRIgine (LAMICTAL) 25 MG tablet TAKE 1 TABLET BY MOUTH EVERYDAY AT BEDTIME Patient taking differently: Take 25 mg by mouth at bedtime. 07/25/21  Yes Meredith Staggers, MD  LORazepam (ATIVAN) 1 MG tablet Take 0.5 tablets (0.5 mg total) by mouth 2 (two) times daily as needed for anxiety. Patient taking differently: Take 0.5 mg by mouth 2 (two) times daily. 08/25/21  Yes Kathie Dike, MD  metFORMIN (GLUCOPHAGE) 500 MG tablet Take 1 tablet (500 mg total) by mouth 2 (two) times daily. 02/22/21  Yes Burnell Blanks, MD  metoprolol tartrate (LOPRESSOR) 25 MG tablet Take 1 tablet (25  mg total) by mouth 3 (three) times daily. 09/11/21  Yes Hosie Poisson, MD  montelukast (SINGULAIR) 10 MG tablet TAKE 1 TABLET BY MOUTH EVERYDAY AT BEDTIME Patient taking differently: Take 10 mg by mouth at bedtime. 09/23/21  Yes Collene Gobble, MD  Multiple Vitamin (MULTIVITAMIN WITH MINERALS) TABS tablet Take 1 tablet by mouth daily. 09/12/21  Yes Hosie Poisson, MD  nitroGLYCERIN (NITROSTAT) 0.4 MG SL tablet Place 1 tablet (0.4 mg total) under the tongue every 5 (five) minutes as needed for chest pain. 02/21/21  Yes Burnell Blanks, MD  oxyCODONE (OXY IR/ROXICODONE) 5 MG immediate release tablet Take 5 mg by mouth 4 (four) times daily as needed for moderate pain. 09/29/21  Yes [provider]  OXYGEN Inhale 4 L into  the lungs continuous.   Yes [provider]  pantoprazole (PROTONIX) 40 MG tablet TAKE 1 TABLET BY MOUTH EVERY DAY Patient taking differently: 40 mg daily. 07/31/21  Yes [name], MD  potassium chloride SA (KLOR-CON M) 20 MEQ tablet Take 1 tablet (20 mEq total) by mouth daily. 09/27/21 10/27/21 Yes Arrien, Jimmy Picket, MD  predniSONE (DELTASONE) 5 MG tablet Take 1 tablet (5 mg total) by mouth daily with breakfast. 09/27/21  Yes Arrien, Jimmy Picket, MD  promethazine (PHENERGAN) 25 MG tablet Take 25 mg by mouth every 6 (six) hours as needed for nausea or vomiting.   Yes [provider]  ranolazine (RANEXA) 500 MG 12 hr tablet Take 1 tablet (500 mg total) by mouth 2 (two) times daily. 09/11/21  Yes Hosie Poisson, MD  Respiratory Therapy Supplies (NEBULIZER) DEVI Pt needs nebulizr machine dx J44.9 07/17/21  Yes Collene Gobble, MD  rosuvastatin (CRESTOR) 20 MG tablet Take 1 tablet (20 mg total) by mouth daily. 09/10/21  Yes [name], MD  sertraline (ZOLOFT) 100 MG tablet Take 1 tablet (100 mg total) by mouth daily. 06/06/21  Yes [name], MD  OLANZapine (ZYPREXA) 5 MG tablet Take 5 mg by mouth at bedtime. 10/02/21   [provider]   Allergies  Allergen Reactions   Tetracycline Hives    Patient tolerated Doxycycline Dec 2020   Niacin Other (See Comments)    Mouth blisters   Niaspan [Niacin Er] Other (See Comments)    Mouth blisters   Sulfa Antibiotics Nausea Only and Other (See Comments)    "Tears up stomach"   Sulfonamide Derivatives Other (See Comments)    Reaction: per patient "tears her stomach up"   Codeine Nausea And Vomiting   Erythromycin Nausea And Vomiting   Hydromorphone Hcl Nausea And Vomiting   Morphine And Related Nausea And Vomiting   Nalbuphine Nausea And Vomiting    Nubain   Sulfasalazine Nausea Only and Other (See Comments)    per patient "tears her stomach up", "Tears up stomach"   Tape Rash and Other (See Comments)     No "plastic" tape," please----cloth tape only   Review of Systems  Constitutional:  Positive for activity change and appetite change.  Respiratory:  Positive for shortness of breath.   Gastrointestinal:  Negative for nausea and vomiting.  Neurological:  Positive for weakness.  All other systems reviewed and are negative.  Physical Exam Vitals and nursing note reviewed.  Constitutional:      General: She is not in acute distress. Pulmonary:     Effort: No respiratory distress.     Comments: Non-rebreather Skin:    General: Skin is warm and dry.  Neurological:  Mental Status: She is alert and oriented to person, place, and time.     Motor: Weakness present.  Psychiatric:        Attention and Perception: Attention normal.        Behavior: Behavior is cooperative.        Cognition and Memory: Cognition and memory normal.    Vital Signs: BP 115/87    Pulse 70    Temp 98.8 F (37.1 C) (Oral)    Resp (!) 24    Ht _0  (1.549 m)    Wt 89.4 kg    SpO2 97%    BMI 37.24 kg/m  Pain Scale: 0-10   Pain Score: 6    SpO2: SpO2: 97 % O2 Device:SpO2: 97 % O2 Flow Rate: .O2 Flow Rate (L/min): 10 L/min  IO: Intake/output summary: No intake or output data in the 24 hours ending 10/03/21 1059  LBM:   Baseline Weight: Weight: 89.4 kg Most recent weight: Weight: 89.4 kg     Palliative Assessment/Data: PPS 20-30%     Time In/Out: 1100-1230/1500-1600 Time Total: 150 minutes  Greater than 50%  of this time was spent counseling and coordinating care related to the above assessment and plan.  Signed by: Lin Landsman, NP   Please contact Palliative Medicine Team phone at [phone] for questions and concerns.  For individual provider: See Shea Evans

## 2021-10-03 NOTE — Assessment & Plan Note (Signed)
-  Continue Imuran, prednisone -She does not appear to need stress-dosed steroids at this time

## 2021-10-03 NOTE — ED Notes (Signed)
Critical troponin 3,039 reported to Dr. Roxanne Mins

## 2021-10-03 NOTE — Assessment & Plan Note (Signed)
-  Rate controlled with Lopressor -Currently on Heparin but she was on Eliquis PTA

## 2021-10-03 NOTE — Assessment & Plan Note (Addendum)
-  She has known obstructive CAD and a long-standing h/o CAD s/p multiple stents and CABG -She recently underwent unsuccessful PCI and has transitioned to hospice -Patient with acute onset of CP/SOB overnight -Troponin is markedly elevated and this is likely repeat NSTEMI -Unfortunately, she is not a candidate for further intervention -She was started on Heparin in the ER -I discussed the option of transitioning to comfort care and she is not ready to do this -Will admit to telemetry -Cardiology consult is pending -Continue Ranexa, Imdur, Plavix

## 2021-10-03 NOTE — Assessment & Plan Note (Signed)
-  She has a terminal condition and is enrolled in home hospice -She is likely to have recurrent short-term admissions with the same issues until her demise -As such, transition to comfort care is reasonable -It does not appear that this has been discussed with her to date -Palliative care consult requested

## 2021-10-04 ENCOUNTER — Encounter (HOSPITAL_COMMUNITY): Payer: Self-pay | Admitting: Internal Medicine

## 2021-10-04 DIAGNOSIS — I249 Acute ischemic heart disease, unspecified: Secondary | ICD-10-CM | POA: Diagnosis not present

## 2021-10-04 DIAGNOSIS — Z515 Encounter for palliative care: Secondary | ICD-10-CM | POA: Diagnosis not present

## 2021-10-04 DIAGNOSIS — Z7189 Other specified counseling: Secondary | ICD-10-CM

## 2021-10-04 MED ORDER — POTASSIUM CHLORIDE CRYS ER 20 MEQ PO TBCR
40.0000 meq | EXTENDED_RELEASE_TABLET | Freq: Once | ORAL | Status: AC
  Administered 2021-10-04: 40 meq via ORAL
  Filled 2021-10-04: qty 2

## 2021-10-04 MED ORDER — LORAZEPAM 2 MG/ML PO CONC
1.0000 mg | Freq: Three times a day (TID) | ORAL | Status: DC
  Administered 2021-10-04 – 2021-10-05 (×4): 1 mg via ORAL
  Filled 2021-10-04 (×2): qty 1
  Filled 2021-10-04: qty 0.5
  Filled 2021-10-04: qty 1

## 2021-10-04 MED ORDER — FUROSEMIDE 10 MG/ML IJ SOLN
60.0000 mg | Freq: Once | INTRAMUSCULAR | Status: AC
  Administered 2021-10-04: 60 mg via INTRAVENOUS
  Filled 2021-10-04: qty 6

## 2021-10-04 MED ORDER — APIXABAN 5 MG PO TABS
5.0000 mg | ORAL_TABLET | Freq: Two times a day (BID) | ORAL | Status: DC
  Administered 2021-10-04 – 2021-10-05 (×3): 5 mg via ORAL
  Filled 2021-10-04 (×3): qty 1

## 2021-10-04 NOTE — Progress Notes (Signed)
PROGRESS NOTE  Katherine [name] ZSW:[id number] DOB: [birth year] DOA: 10/03/2021 PCP: [name], MD   LOS: 1 day   Brief Narrative / Interim history: 66 year old female with severe CAD status post stents and CABG in the past, COPD on home oxygen, DM, hyperlipidemia, OSA not on CPAP, A-fib comes to the hospital with chest pain and shortness of breath.  She was recently admitted just 2 weeks ago with decompensated heart failure and non-STEMI, she also was here recently for Covid and MRSA pneumonia in December as well as a non-STEMI in January.  Most recent cardiac cath in January was unsuccessful for PCI, and also complicated by hematoma.  Given lack of further therapeutic options she was discharged home with hospice.  The day prior to admission experienced severe chest pain associated with increasing shortness of breath and she came back to the hospital.  Subjective / 24h Interval events: Breathing a little bit better.  Quite tearful this morning about the whole situation, she is afraid of dying  Assesement and Plan: Principal Problem:   ACS (acute coronary syndrome) ([place]) Active Problems:   Renal transplant disorder   Essential hypertension   Paroxysmal atrial fibrillation (HCC); CHA2DS2VASc score F, HTN, CAD, CVA = 5   Type 2 diabetes mellitus with hyperlipidemia (HCC)   Anxiety and depression   Chronic respiratory failure with hypoxia (HCC)   Acute on chronic diastolic CHF (congestive heart failure) (HCC)   Goals of care, counseling/discussion   Assessment and Plan: Principal problem ACS, non-STEMI, severe CAD with history of CABG - her disease unfortunately is not amenable to further interventions.  Most recent cath was complicated by hematoma and patient was unsuccessful.  Cardiology consulted, managing medically for now.  Will resume hospice upon discharge  Active problems Acute on chronic hypoxic respiratory failure due to acute on chronic diastolic CHF -patient was  diuresed with IV Lasix.  On admission she was on 10 L, has been weaned down to 4 L this morning.  There is still an element of fluid overload, repeat IV Lasix.  She wishes to feel more comfortable with her breathing before going back home with hospice.  Anxiety/depression-appreciate palliative help in managing her anxiety  Paroxysmal A-fib-continue Lopressor, currently on heparin due to non-STEMI, switch to Eliquis per cardiology  History of renal transplant-continue azathioprine, prednisone  Essential hypertension-continue home medications as below  Goals of care-Long discussion at bedside this morning with the patient.  She is understandably fearful about the disease process, did not expect to be at the end of her life this early, and not have a longer future with her family.  Continue supportive care  Type 2 diabetes mellitus, poorly controlled, A1c 7.9, with hyperglycemia-cover with sliding scale while here  CBG (last 3)  Recent Labs    10/03/21 1148  GLUCAP 184*    Scheduled Meds:  arformoterol  15 mcg Nebulization BID   aspirin  324 mg Oral Once   azaTHIOprine  125 mg Oral Daily   budesonide  2 mL Nebulization BID   clopidogrel  75 mg Oral Daily   docusate sodium  100 mg Oral BID   fluticasone  2 spray Each Nare Daily   furosemide  60 mg Intravenous Once   gabapentin  100 mg Oral QHS   isosorbide mononitrate  60 mg Oral Daily   lamoTRIgine  25 mg Oral QHS   loratadine  10 mg Oral Daily   LORazepam  1 mg Oral TID   metoprolol tartrate  25 mg Oral TID   montelukast  10 mg Oral QHS   morphine CONCENTRATE  10 mg Oral Q6H   OLANZapine  5 mg Oral QHS   ondansetron  4 mg Oral Q8H   potassium chloride  40 mEq Oral Once   predniSONE  5 mg Oral Q breakfast   ranolazine  500 mg Oral BID   sertraline  100 mg Oral Daily   sodium chloride flush  3 mL Intravenous Q12H   Continuous Infusions: PRN Meds:.acetaminophen **OR** acetaminophen, albuterol, antiseptic oral rinse,  bisacodyl, cyclobenzaprine, glycopyrrolate **OR** glycopyrrolate **OR** glycopyrrolate, haloperidol **OR** haloperidol **OR** haloperidol lactate, LORazepam, morphine CONCENTRATE, oxyCODONE, polyethylene glycol, polyvinyl alcohol, traZODone  Diet Orders (From admission, onward)     Start     Ordered   10/03/21 1626  Diet regular Room service appropriate? Yes; Fluid consistency: Thin  Diet effective now       Question Answer Comment  Room service appropriate? Yes   Fluid consistency: Thin      10/03/21 1627            DVT prophylaxis:    Lab Results  Component Value Date   PLT 230 10/03/2021      Code Status: DNR  Family Communication: no family at bedside   Status is: Inpatient  Remains inpatient appropriate because: Diuresing  Level of care: Palliative Care  Consultants:  Cardiology, palliative care  Procedures:  none  Microbiology  none  Antimicrobials: none    Objective: Vitals:   10/03/21 2154 10/04/21 0009 10/04/21 0616 10/04/21 0854  BP: 117/70  (!) 116/59   Pulse: 90 (!) 55 78   Resp: 20 18 20    Temp: 99.6 F (37.6 C)  98.5 F (36.9 C)   TempSrc: Oral  Oral   SpO2: 95%  91% 91%  Weight: 90 kg     Height: 5\' 1"  (1.549 m)       Intake/Output Summary (Last 24 hours) at 10/04/2021 1044 Last data filed at 10/03/2021 2215 Gross per 24 hour  Intake 240 ml  Output --  Net 240 ml   Wt Readings from Last 3 Encounters:  10/03/21 90 kg  09/24/21 89.4 kg  09/11/21 91.7 kg    Examination:  Constitutional: NAD Eyes: no scleral icterus ENMT: Mucous membranes are moist.  Neck: normal, supple Respiratory: Bibasilar crackles heard, no wheezing.  Diminished at the bases Cardiovascular: Regular rate and rhythm Abdomen: non distended, no tenderness. Bowel sounds positive.  Musculoskeletal: no clubbing / cyanosis.  Skin: no rashes Neurologic: Nonfocal  Data Reviewed: I have independently reviewed following labs and imaging studies    CBC Recent Labs  Lab 10/03/21 0520  WBC 10.1  HGB 8.8*  HCT 28.3*  PLT 230  MCV 90.7  MCH 28.2  MCHC 31.1  RDW 19.2*  LYMPHSABS 0.9  MONOABS 1.0  EOSABS 0.4  BASOSABS 0.0    Recent Labs  Lab 10/03/21 0520  NA 134*  K 3.8  CL 100  CO2 24  GLUCOSE 147*  BUN 9  CREATININE 0.98  CALCIUM 9.1  BNP 800.5*    ------------------------------------------------------------------------------------------------------------------ No results for input(s): CHOL, HDL, LDLCALC, TRIG, CHOLHDL, LDLDIRECT in the last 72 hours.  Lab Results  Component Value Date   HGBA1C 7.9 (H) 08/11/2021   ------------------------------------------------------------------------------------------------------------------ No results for input(s): TSH, T4TOTAL, T3FREE, THYROIDAB in the last 72 hours.  Invalid input(s): FREET3  Cardiac Enzymes No results for input(s): CKMB, TROPONINI, MYOGLOBIN in the last 168 hours.  Invalid input(s): CK ------------------------------------------------------------------------------------------------------------------  Component Value Date/Time   BNP 800.5 (H) 10/03/2021 0520    CBG: Recent Labs  Lab 10/03/21 1148  GLUCAP 184*    Recent Results (from the past 240 hour(s))  Resp Panel by RT-PCR (Flu A&B, Covid) Nasopharyngeal Swab     Status: None   Collection Time: 10/03/21  9:38 PM   Specimen: Nasopharyngeal Swab; Nasopharyngeal(NP) swabs in vial transport medium  Result Value Ref Range Status   SARS Coronavirus 2 by RT PCR NEGATIVE NEGATIVE Final    Comment: (NOTE) SARS-CoV-2 target nucleic acids are NOT DETECTED.  The SARS-CoV-2 RNA is generally detectable in upper respiratory specimens during the acute phase of infection. The lowest concentration of SARS-CoV-2 viral copies this assay can detect is 138 copies/mL. A negative result does not preclude SARS-Cov-2 infection and should not be used as the sole basis for treatment or other patient  management decisions. A negative result may occur with  improper specimen collection/handling, submission of specimen other than nasopharyngeal swab, presence of viral mutation(s) within the areas targeted by this assay, and inadequate number of viral copies(<138 copies/mL). A negative result must be combined with clinical observations, patient history, and epidemiological information. The expected result is Negative.  Fact Sheet for Patients:  EntrepreneurPulse.com.au  Fact Sheet for Healthcare Providers:  IncredibleEmployment.be  This test is no t yet approved or cleared by the [place] FDA and  has been authorized for detection and/or diagnosis of SARS-CoV-2 by FDA under an Emergency Use Authorization (EUA). This EUA will remain  in effect (meaning this test can be used) for the duration of the COVID-19 declaration under Section 564(b)(1) of the Act, 21 U.S.C.section 360bbb-3(b)(1), unless the authorization is terminated  or revoked sooner.       Influenza A by PCR NEGATIVE NEGATIVE Final   Influenza B by PCR NEGATIVE NEGATIVE Final    Comment: (NOTE) The Xpert Xpress SARS-CoV-2/FLU/RSV plus assay is intended as an aid in the diagnosis of influenza from Nasopharyngeal swab specimens and should not be used as a sole basis for treatment. Nasal washings and aspirates are unacceptable for Xpert Xpress SARS-CoV-2/FLU/RSV testing.  Fact Sheet for Patients: EntrepreneurPulse.com.au  Fact Sheet for Healthcare Providers: IncredibleEmployment.be  This test is not yet approved or cleared by the [place] FDA and has been authorized for detection and/or diagnosis of SARS-CoV-2 by FDA under an Emergency Use Authorization (EUA). This EUA will remain in effect (meaning this test can be used) for the duration of the COVID-19 declaration under Section 564(b)(1) of the Act, 21 U.S.C. section 360bbb-3(b)(1),  unless the authorization is terminated or revoked.  Performed at [facility], [place] [address], [place], [place] [address]      Radiology Studies: No results found.   Marzetta Board, MD, PhD [facility]  Between 7 am - 7 pm I am available, please contact me via Amion (for emergencies) or Securechat (non urgent messages)  Between 7 pm - 7 am I am not available, please contact night coverage MD/APP via Amion

## 2021-10-04 NOTE — Progress Notes (Signed)
[name] received to room 6N12C/6N12C-01 from ED with Acute on chronic diastolic heart failure ([place]), Non-STEMI (non-ST elevated myocardial infarction) ([place]), Hyponatremia and Normochromic normocytic anemia.  Pt admitted for palliative care.  She was on 10 liters n/c and weaned down to her home rate of 4 liters.  Respiratory notified of nebulizer orders.  She reported back and leg pain which was eased with PRN Oxycodone.  She has scheduled Morphine q6h for respiratory discomfort.  She is tearful and reports being scared about what to do.  Chaplain consult entered.    BP 117/70 (BP Location: Right Arm)    Pulse 90    Temp 99.6 F (37.6 C) (Oral)    Resp 20    Ht 5\' 1"  (1.549 m)    Wt 90 kg    SpO2 95%    BMI 37.49 kg/m   Patient oriented to room and unit.  Call bell and personal items in reach.  Admission history completed.   Ayesha Mohair BSN RN [place] 10/04/2021, 12:06 AM

## 2021-10-04 NOTE — Progress Notes (Signed)
Palliative Medicine Inpatient Follow Up Note  Reason for Consultation: Establishing goals of care   HPI/Patient Profile: 66 y.o. female  with past medical history of severe CAD s/p stents and CABG, COPD on home O2, DM, HLD, ESRD, HTN, OSA not on CPAP, afib, and RA presented to ED on 10/03/21 with complaints of shortness of breath. Of note, patient has had 6 admissions in the last 6 months with a 30 day readmission - she is familiar to PMT from previous hospitalizations. She was last admitted 2/4-2/10/23 for NSTEMI and discharged with [employer] [facility] and Hospice. Patient was admitted on 10/03/2021 with ACS, acute on chronic diastolic CHF, chronic respiratory failure with hypoxia, paroxysmal atrial fibrillation.  Today's Discussion (10/04/2021):  *Please note that this is a verbal dictation therefore any spelling or grammatical errors are due to the "[place]" system interpretation.  Chart reviewed inclusive of vital signs, progress notes, laboratory results, and diagnostic images.   I met with Katherine [name] at bedside this morning.  She shares with me that she is having a hard time coming to grips with her present health situation.  She is tearful and expresses a fear of the unknown.  She shares that she has a strong [place] faith though she wonders if beyond this life there is anything additional. Created space and opportunity for patient to explore thoughts feelings and fears regarding current medical situation.  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  History Katherine [name] if she was frightened that she was going to get to a point where her symptoms could not be controlled which she denies.  She shares with me that she feels her symptoms will be well controlled by her brother who is done an excellent job of  caring for her so far.  Asked Katherine [name] what she is looking forward to and getting out of the hospital and she shares that she has an [place] who brings her great joy.  She also has nieces who have small children and she enjoys this as well.  From a symptom management perspective discussed better addressing her anxiety so that she was not feeling a constant state of doom.  Reviewed the importance of pain and dyspnea management.  Reviewed the importance of fever control.  Questions and concerns addressed   Palliative Support Provided  Objective Assessment: Vital Signs Vitals:   10/04/21 0616 10/04/21 0854  BP: (!) 116/59   Pulse: 78   Resp: 20   Temp: 98.5 F (36.9 C)   SpO2: 91% 91%    Intake/Output Summary (Last 24 hours) at 10/04/2021 1101 Last data filed at 10/03/2021 2215 Gross per 24 hour  Intake 240 ml  Output --  Net 240 ml   Last Weight  Most recent update: 10/03/2021  9:58 PM    Weight  90 kg (198 lb 6.4 oz)            Gen:  Older Caucasian F in NAD HEENT: moist mucous membranes CV: Regular rate and rhythm  PULM: On 4LPM [place], (+) Bilateral crackles ABD: soft/nontender  EXT: (+) BLE edema Neuro: Alert and oriented x3  SUMMARY OF RECOMMENDATIONS   Continue full comfort measures Continue DNR/DNI as previously documented Patient's goal is to return home with hospice as soon as possible if her symptoms  can be managed - oxygen concentrator and comfort medications are confirmed at patient's home  Added orders for EOL symptom management and to reflect full comfort measures, as well as discontinued orders that were not focused on comfort Added Ativa TID Unrestricted visitation orders were placed per current [place] EOL visitation policy  Nursing to provide frequent assessments and administer PRN medications as clinically necessary to ensure EOL comfort PMT will continue to follow and support holistically  MDM - High  Medical Decision  Making:4 #/Complex Problems: 4                     Data Reviewed:    4             Management: 4 (1-Straightforward, 2-Low, 3-Moderate, 4-High) ______________________________________________________________________________________ [place] Team Team Cell Phone: [phone] Please utilize secure chat with additional questions, if there is no response within 30 minutes please call the above phone number  Palliative Medicine Team providers are available by phone from 7am to 7pm daily and can be reached through the team cell phone.  Should this patient require assistance outside of these hours, please call the patient's attending physician.

## 2021-10-04 NOTE — TOC Progression Note (Signed)
Transition of Care [facility]) - Progression Note    Patient Details  Name: Katherine [name] MRN: [id number] Date of Birth: [birth year]  [facility]) CM/SW Contact  Bartholomew Crews, RN Phone Number: [phone] 10/04/2021, 9:23 AM  Clinical Narrative:     Damaris Schooner with Margaretmary Lombard at [facility] on the phone. Patient is an active hospice patient since 09/26/2021. Ahngela will round on patient this morning and follow for discharge.       Expected Discharge Plan and Services                                                 Social Determinants of Health (SDOH) Interventions    Readmission Risk Interventions Readmission Risk Prevention Plan 09/23/2021 09/08/2021 05/14/2021  Transportation Screening Complete Complete Complete  HRI or [place] for [place] - - -  Medication Review Press photographer) Complete Complete Complete  PCP or Specialist appointment within 3-5 days of discharge Complete Complete Complete  HRI or [place] Complete Complete Complete  SW Recovery Care/Counseling Consult Complete Complete Complete  Palliative Care Screening Complete Not Applicable Not [place] Not Applicable Not Applicable Not Applicable  Some recent data might be hidden

## 2021-10-05 DIAGNOSIS — I5033 Acute on chronic diastolic (congestive) heart failure: Secondary | ICD-10-CM | POA: Diagnosis not present

## 2021-10-05 DIAGNOSIS — I480 Paroxysmal atrial fibrillation: Secondary | ICD-10-CM

## 2021-10-05 DIAGNOSIS — F419 Anxiety disorder, unspecified: Secondary | ICD-10-CM | POA: Diagnosis not present

## 2021-10-05 DIAGNOSIS — Z7189 Other specified counseling: Secondary | ICD-10-CM | POA: Diagnosis not present

## 2021-10-05 DIAGNOSIS — I214 Non-ST elevation (NSTEMI) myocardial infarction: Secondary | ICD-10-CM

## 2021-10-05 DIAGNOSIS — E785 Hyperlipidemia, unspecified: Secondary | ICD-10-CM

## 2021-10-05 DIAGNOSIS — I249 Acute ischemic heart disease, unspecified: Secondary | ICD-10-CM | POA: Diagnosis not present

## 2021-10-05 DIAGNOSIS — E1169 Type 2 diabetes mellitus with other specified complication: Secondary | ICD-10-CM

## 2021-10-05 DIAGNOSIS — D649 Anemia, unspecified: Secondary | ICD-10-CM

## 2021-10-05 DIAGNOSIS — J9611 Chronic respiratory failure with hypoxia: Secondary | ICD-10-CM | POA: Diagnosis not present

## 2021-10-05 DIAGNOSIS — I10 Essential (primary) hypertension: Secondary | ICD-10-CM

## 2021-10-05 DIAGNOSIS — F32A Depression, unspecified: Secondary | ICD-10-CM

## 2021-10-05 DIAGNOSIS — Z515 Encounter for palliative care: Secondary | ICD-10-CM | POA: Diagnosis not present

## 2021-10-05 DIAGNOSIS — E871 Hypo-osmolality and hyponatremia: Secondary | ICD-10-CM

## 2021-10-05 MED ORDER — LORAZEPAM 2 MG/ML PO CONC: 1.0000 mg | Freq: Three times a day (TID) | ORAL | 0 refills | Status: DC

## 2021-10-05 MED ORDER — MORPHINE SULFATE (CONCENTRATE) 10 MG/0.5ML PO SOLN: 5.0000 mg | ORAL | 0 refills | Status: DC | PRN

## 2021-10-05 MED ORDER — LORAZEPAM 2 MG/ML PO CONC: 1.0000 mg | ORAL | 0 refills | Status: DC | PRN

## 2021-10-05 MED ORDER — POLYETHYLENE GLYCOL 3350 17 G PO PACK: 17.0000 g | PACK | Freq: Every day | ORAL | 0 refills | Status: DC | PRN

## 2021-10-05 MED ORDER — DOCUSATE SODIUM 100 MG PO CAPS: 100.0000 mg | ORAL_CAPSULE | Freq: Two times a day (BID) | ORAL | 0 refills | Status: DC

## 2021-10-05 MED ORDER — BISACODYL 5 MG PO TBEC: 5.0000 mg | DELAYED_RELEASE_TABLET | Freq: Every day | ORAL | 0 refills | Status: DC | PRN

## 2021-10-05 MED ORDER — MORPHINE SULFATE (CONCENTRATE) 10 MG/0.5ML PO SOLN: 10.0000 mg | Freq: Four times a day (QID) | ORAL | 0 refills | Status: DC

## 2021-10-05 MED ORDER — ONDANSETRON 4 MG PO TBDP: 4.0000 mg | ORAL_TABLET | Freq: Three times a day (TID) | ORAL | 0 refills | Status: DC

## 2021-10-05 NOTE — TOC Transition Note (Signed)
Transition of Care [facility]) - CM/SW Discharge Note   Patient Details  Name: Katherine [name] MRN: [id number] Date of Birth: June 22, 1956  Transition [facility]) CM/SW Contact:  Bartholomew Crews, RN Phone Number: [phone] 10/05/2021, 9:44 AM   Clinical Narrative:     Received notification of patient's readiness to transition home. Spoke with patient at the bedside and with her niece on speaker phone. They are in agreement. Plan for PTAR for transportation d/t oxygen needs. PTAR notified. Medical transport paperwork on chart.   Final next level of care: Home w Hospice Care Barriers to Discharge: No Barriers Identified   Patient Goals and CMS Choice Patient states their goals for this hospitalization and ongoing recovery are:: return home with brother and resume hospice servicees with [facility] CMS Medicare.gov Compare Post Acute Care list provided to:: Patient Choice offered to / list presented to : Patient  Discharge Placement                       Discharge Plan and Services                DME Arranged: N/A DME Agency: NA       HH Arranged: NA HH Agency: NA        Social Determinants of Health (SDOH) Interventions     Readmission Risk Interventions Readmission Risk Prevention Plan 09/23/2021 09/08/2021 05/14/2021  Transportation Screening Complete Complete Complete  HRI or [place] for [place] - - -  Medication Review Press photographer) Complete Complete Complete  PCP or Specialist appointment within 3-5 days of discharge Complete Complete Complete  HRI or [place] Complete Complete Complete  SW Recovery Care/Counseling Consult Complete Complete Complete  Palliative Care Screening Complete Not Applicable Not [place] Not Applicable Not Applicable Not Applicable  Some recent data might be hidden

## 2021-10-05 NOTE — Discharge Summary (Signed)
Physician Discharge Summary  Katherine [name] NKN:[id number] DOB: 1956/06/05 DOA: 10/03/2021  PCP: Katherine Lima, MD  Admit date: 10/03/2021 Discharge date: 10/05/2021  Time spent: 15 minutes  Recommendations for Outpatient Follow-up:   ACS, non-STEMI, severe CAD with history of CABG - her disease unfortunately is not amenable to further interventions.  Most recent cath was complicated by hematoma and patient was unsuccessful.  Cardiology consulted, managing medically for now.  Will resume hospice upon discharge   Active problems Acute on chronic hypoxic respiratory failure due to acute on chronic diastolic CHF -patient was diuresed with IV Lasix.  On admission she was on 10 L, has been weaned down to 4 L this morning.  There is still an element of fluid overload, repeat IV Lasix.  She wishes to feel more comfortable with her breathing before going back home with hospice.   Anxiety/depression-appreciate palliative help in managing her anxiety   Paroxysmal A-fib-continue Lopressor, currently on heparin due to non-STEMI, switch to Eliquis per cardiology   History of renal transplant-continue azathioprine, prednisone   Essential hypertension-continue home medications as below   Goals of care-Long discussion at bedside this morning with the patient.  She is understandably fearful about the disease process, did not expect to be at the end of her life this early, and not have a longer future with her family.  Continue supportive care   Type 2 diabetes mellitus, poorly controlled, A1c 7.9, with hyperglycemia-cover with sliding scale while here Lab Results  Component Value Date   GLUCAP 184 (H) 10/03/2021   GLUCAP 259 (H) 09/26/2021   GLUCAP 204 (H) 09/26/2021   GLUCAP 201 (H) 09/25/2021   GLUCAP 226 (H) 09/25/2021   Plan of care - Home with hospice  with [facility]   Discharge Diagnoses:  Principal Problem:   ACS (acute coronary syndrome) ([place]) Active Problems:   Renal transplant  disorder   Essential hypertension   Paroxysmal atrial fibrillation ([place]); CHA2DS2VASc score F, HTN, CAD, CVA = 5   Type 2 diabetes mellitus with hyperlipidemia (HCC)   Anxiety and depression   Chronic respiratory failure with hypoxia (HCC)   Acute on chronic diastolic CHF (congestive heart failure) ([place])   Goals of care, counseling/discussion   Discharge Condition: Guarded  Diet recommendation: Regular  Filed Weights   10/03/21 0615 10/03/21 2154  Weight: 89.4 kg 90 kg    History of present illness:  66 year old WF PMHx severe CAD status post stents and CABG in the past, COPD on home oxygen, DM, hyperlipidemia, OSA not on CPAP, A-fib   Comes to the hospital with chest pain and shortness of breath.  She was recently admitted just 2 weeks ago with decompensated heart failure and non-STEMI, she also was here recently for Covid and MRSA pneumonia in December as well as a non-STEMI in January.  Most recent cardiac cath in January was unsuccessful for PCI, and also complicated by hematoma.  Given lack of further therapeutic options she was discharged home with hospice.  The day prior to admission experienced severe chest pain associated with increasing shortness of breath and she came back to the hospital.  Hospital Course:  See above  Consultations: Cardiology, Palliative care    Discharge Exam: Vitals:   10/04/21 2100 10/04/21 2121 10/05/21 0433 10/05/21 0824  BP: 121/65  (!) 108/58   Pulse: 73 76 66   Resp: 18 20 (!) 24   Temp: 97.7 F (36.5 C)  98.2 F (36.8 C)   TempSrc: Oral  Oral  SpO2: 92%  (!) 89% 91%  Weight:      Height:        Constitutional: NAD Eyes: no scleral icterus ENMT: Mucous membranes are moist.  Neck: normal, supple Respiratory: Bibasilar crackles heard, no wheezing.  Diminished at the bases Cardiovascular: Regular rate and rhythm   Discharge Instructions   Allergies as of 10/05/2021       Reactions   Tetracycline Hives   Patient  tolerated Doxycycline Dec 2020   Niacin Other (See Comments)   Mouth blisters   Niaspan [niacin Er] Other (See Comments)   Mouth blisters   Sulfa Antibiotics Nausea Only, Other (See Comments)   "Tears up stomach"   Sulfonamide Derivatives Other (See Comments)   Reaction: per patient "tears her stomach up"   Codeine Nausea And Vomiting   Erythromycin Nausea And Vomiting   Hydromorphone Hcl Nausea And Vomiting   Morphine And Related Nausea And Vomiting   Nalbuphine Nausea And Vomiting   Nubain   Sulfasalazine Nausea Only, Other (See Comments)   per patient "tears her stomach up", "Tears up stomach"   Tape Rash, Other (See Comments)   No "plastic" tape," please----cloth tape only        Medication List     STOP taking these medications    apixaban 5 MG Tabs tablet Commonly known as: Eliquis   Basaglar KwikPen 100 UNIT/ML   clopidogrel 75 MG tablet Commonly known as: PLAVIX   furosemide 40 MG tablet Commonly known as: LASIX   glimepiride 4 MG tablet Commonly known as: AMARYL   LORazepam 1 MG tablet Commonly known as: ATIVAN Replaced by: LORazepam 2 MG/ML concentrated solution   metFORMIN 500 MG tablet Commonly known as: GLUCOPHAGE   multivitamin with minerals Tabs tablet   Nebulizer Devi   nitroGLYCERIN 0.4 MG SL tablet Commonly known as: NITROSTAT   NovoLOG FlexPen 100 UNIT/ML FlexPen Generic drug: insulin aspart   pantoprazole 40 MG tablet Commonly known as: PROTONIX   potassium chloride SA 20 MEQ tablet Commonly known as: KLOR-CON M   predniSONE 5 MG tablet Commonly known as: DELTASONE   promethazine 25 MG tablet Commonly known as: PHENERGAN   rosuvastatin 20 MG tablet Commonly known as: CRESTOR       TAKE these medications    albuterol 108 (90 Base) MCG/ACT inhaler Commonly known as: VENTOLIN HFA TAKE 2 PUFFS BY MOUTH EVERY 6 HOURS AS NEEDED FOR WHEEZE OR SHORTNESS OF BREATH What changed: See the new instructions.   arformoterol 15  MCG/2ML Nebu Commonly known as: BROVANA Take 2 mLs (15 mcg total) by nebulization 2 (two) times daily.   azaTHIOprine 50 MG tablet Commonly known as: IMURAN Take 125 mg by mouth See admin instructions. Take 2 1/2 tablets (125 mg) by mouth daily at 3pm   bisacodyl 5 MG EC tablet Commonly known as: DULCOLAX Take 1 tablet (5 mg total) by mouth daily as needed for moderate constipation.   budesonide 0.5 MG/2ML nebulizer solution Commonly known as: PULMICORT Take 2 mLs (0.5 mg total) by nebulization 2 (two) times daily.   cetirizine 5 MG tablet Commonly known as: ZYRTEC Take 5 mg by mouth daily.   cyclobenzaprine 10 MG tablet Commonly known as: FLEXERIL Take 1 tablet (10 mg total) by mouth 3 (three) times daily as needed for muscle spasms.   docusate sodium 100 MG capsule Commonly known as: COLACE Take 1 capsule (100 mg total) by mouth 2 (two) times daily.   fluticasone 50 MCG/ACT nasal spray Commonly  known as: FLONASE PLACE 2 SPRAYS INTO BOTH NOSTRILS 2 TIMES DAILY. What changed: See the new instructions.   gabapentin 100 MG capsule Commonly known as: NEURONTIN Take 1 capsule (100 mg total) by mouth at bedtime.   isosorbide mononitrate 60 MG 24 hr tablet Commonly known as: IMDUR Take 1 tablet (60 mg total) by mouth daily.   lamoTRIgine 25 MG tablet Commonly known as: LAMICTAL TAKE 1 TABLET BY MOUTH EVERYDAY AT BEDTIME What changed: See the new instructions.   LORazepam 2 MG/ML concentrated solution Commonly known as: ATIVAN Take 0.5 mLs (1 mg total) by mouth 3 (three) times daily. Replaces: LORazepam 1 MG tablet   LORazepam 2 MG/ML concentrated solution Commonly known as: ATIVAN Place 0.5 mLs (1 mg total) under the tongue every hour as needed for anxiety, seizure, sedation or sleep (distress).   metoprolol tartrate 25 MG tablet Commonly known as: LOPRESSOR Take 1 tablet (25 mg total) by mouth 3 (three) times daily.   montelukast 10 MG tablet Commonly known as:  SINGULAIR TAKE 1 TABLET BY MOUTH EVERYDAY AT BEDTIME What changed: See the new instructions.   morphine CONCENTRATE 10 MG/0.5ML Soln concentrated solution Take 0.5 mLs (10 mg total) by mouth every 6 (six) hours.   morphine CONCENTRATE 10 MG/0.5ML Soln concentrated solution Take 0.25-0.5 mLs (5-10 mg total) by mouth every 2 (two) hours as needed for severe pain, anxiety or shortness of breath (distress).   OLANZapine 5 MG tablet Commonly known as: ZYPREXA Take 5 mg by mouth at bedtime.   ondansetron 4 MG disintegrating tablet Commonly known as: ZOFRAN-ODT Take 1 tablet (4 mg total) by mouth every 8 (eight) hours.   oxyCODONE 5 MG immediate release tablet Commonly known as: Oxy IR/ROXICODONE Take 5 mg by mouth 4 (four) times daily as needed for moderate pain.   OXYGEN Inhale 4 L into the lungs continuous.   polyethylene glycol 17 g packet Commonly known as: MIRALAX / GLYCOLAX Take 17 g by mouth daily as needed for mild constipation.   ranolazine 500 MG 12 hr tablet Commonly known as: Ranexa Take 1 tablet (500 mg total) by mouth 2 (two) times daily.   sertraline 100 MG tablet Commonly known as: ZOLOFT Take 1 tablet (100 mg total) by mouth daily.       Allergies  Allergen Reactions   Tetracycline Hives    Patient tolerated Doxycycline Dec 2020   Niacin Other (See Comments)    Mouth blisters   Niaspan [Niacin Er] Other (See Comments)    Mouth blisters   Sulfa Antibiotics Nausea Only and Other (See Comments)    "Tears up stomach"   Sulfonamide Derivatives Other (See Comments)    Reaction: per patient "tears her stomach up"   Codeine Nausea And Vomiting   Erythromycin Nausea And Vomiting   Hydromorphone Hcl Nausea And Vomiting   Morphine And Related Nausea And Vomiting   Nalbuphine Nausea And Vomiting    Nubain   Sulfasalazine Nausea Only and Other (See Comments)    per patient "tears her stomach up", "Tears up stomach"   Tape Rash and Other (See Comments)    No  "plastic" tape," please----cloth tape only      The results of significant diagnostics from this hospitalization (including imaging, microbiology, ancillary and laboratory) are listed below for reference.    Significant Diagnostic Studies: DG Chest 2 View  Result Date: 09/20/2021 CLINICAL DATA:  Difficulty breathing EXAM: CHEST - 2 VIEW COMPARISON:  Previous studies including the examination of 09/07/2021  FINDINGS: Transverse diameter of heart is increased. There is evidence of previous coronary bypass surgery. Central pulmonary vessels are prominent. There is poor inspiration. Increased interstitial markings are seen in both lungs. There is minimal blunting of lateral CP angles. There is no pneumothorax. IMPRESSION: Cardiomegaly. Diffuse increase in interstitial markings in both lungs may suggest pulmonary edema or interstitial pneumonia. Part of this finding may suggest underlying scarring. Possible small bilateral pleural effusions. Electronically Signed   By: Elmer Picker M.D.   On: 09/20/2021 11:48   DG Chest Port 1 View  Result Date: 10/03/2021 CLINICAL DATA:  Short of breath. EXAM: PORTABLE CHEST 1 VIEW COMPARISON:  09/23/2021 FINDINGS: Previous median sternotomy and CABG procedure. Stable cardiomediastinal contours. Interval increase in bilateral interstitial and airspace opacities. No acute osseous findings. IMPRESSION: Worsening aeration to the lungs bilaterally compatible with progressive pulmonary edema versus multifocal infection. Electronically Signed   By: Kerby Moors M.D.   On: 10/03/2021 05:52   DG CHEST PORT 1 VIEW  Result Date: 09/23/2021 CLINICAL DATA:  Shortness of breath EXAM: PORTABLE CHEST 1 VIEW COMPARISON:  Previous studies including the examination of 09/20/2021 FINDINGS: Transverse diameter of heart is increased. There is interval decrease in pulmonary vascular congestion. There is interval decrease in interstitial markings in both lungs and improvement in  aeration of both lungs. There is residual prominence of interstitial markings in the right parahilar region and both lower lung fields. There is no focal pulmonary consolidation. There is previous coronary bypass surgery. There is blunting of both lateral CP angles suggesting small effusions. There is no pneumothorax. IMPRESSION: Cardiomegaly. There is interval decrease in pulmonary vascular congestion and pulmonary edema. There is residual prominence of interstitial markings in the right parahilar region and both lower lung fields suggesting residual pulmonary edema or interstitial pneumonia. No new focal infiltrates are seen. Electronically Signed   By: Elmer Picker M.D.   On: 09/23/2021 13:13   DG CHEST PORT 1 VIEW  Result Date: 09/07/2021 CLINICAL DATA:  Shortness of breath EXAM: PORTABLE CHEST 1 VIEW COMPARISON:  09/02/2021 FINDINGS: No significant change in rotated AP portable examination, with cardiomegaly status post median sternotomy and CABG. Unchanged, diffuse bilateral interstitial opacity. Left upper extremity PICC. IMPRESSION: No significant change in rotated AP portable examination, with cardiomegaly status post median sternotomy and CABG. Unchanged, diffuse bilateral interstitial opacity, consistent with edema or infection. Electronically Signed   By: Delanna Ahmadi M.D.   On: 09/07/2021 14:09   ECHOCARDIOGRAM LIMITED  Result Date: 09/22/2021    ECHOCARDIOGRAM LIMITED REPORT   Patient Name:   AISHA GREENBERGER Date of Exam: 09/21/2021 Medical Rec #:  [id number]          Height:       61.0 in Accession #:    [id number]         Weight:       204.1 lb Date of Birth:  08-22-1955          BSA:          1.905 m Patient Age:    89 years           BP:           113/62 mmHg Patient Gender: F                  HR:           92 bpm. Exam Location:  Inpatient Procedure: Limited Echo and Intracardiac Opacification Agent Indications:    CAD  History:        Patient has prior history of Echocardiogram  examinations, most                 recent 08/09/2021. CAD and Previous Myocardial Infarction, Prior                 CABG; Risk Factors:Hypertension, Diabetes and Dyslipidemia.                 Kidney transplant.  Sonographer:    Merrie Roof RDCS Referring Phys: [id number] [place]  1. Apical psuedoaneuyrsm post MI. Left ventricular ejection fraction, by estimation, is 55 to 60%. The left ventricle has normal function. The left ventricle demonstrates regional wall motion abnormalities (see scoring diagram/findings for description). FINDINGS  Left Ventricle: Apical psuedoaneuyrsm post MI. Left ventricular ejection fraction, by estimation, is 55 to 60%. The left ventricle has normal function. The left ventricle demonstrates regional wall motion abnormalities. Definity contrast agent was given  IV to delineate the left ventricular endocardial borders.  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 Candee Furbish MD Electronically signed by Candee Furbish MD Signature Date/Time: 09/22/2021/11:18:36 AM    Final     Microbiology: Recent Results (from the past 240 hour(s))  Resp Panel by RT-PCR (Flu A&B, Covid) Nasopharyngeal Swab     Status: None   Collection Time: 10/03/21  9:38 PM   Specimen: Nasopharyngeal Swab; Nasopharyngeal(NP) swabs in vial transport medium  Result Value Ref Range Status   SARS Coronavirus 2 by RT PCR NEGATIVE NEGATIVE Final    Comment: (NOTE) SARS-CoV-2 target nucleic acids are NOT DETECTED.  The SARS-CoV-2 RNA is generally detectable in upper respiratory specimens during the acute phase of infection. The lowest concentration of SARS-CoV-2 viral copies this assay can detect is 138 copies/mL. A negative result does not preclude SARS-Cov-2 infection and should not be used as the sole basis for treatment or other patient management decisions. A negative result may occur with  improper specimen collection/handling, submission of specimen other than  nasopharyngeal swab, presence of viral mutation(s) within the areas targeted by this assay, and inadequate number of viral copies(<138 copies/mL). A negative result must be combined with clinical observations, patient history, and epidemiological information. The expected result is Negative.  Fact Sheet for Patients:  EntrepreneurPulse.com.au  Fact Sheet for Healthcare Providers:  IncredibleEmployment.be  This test is no t yet approved or cleared by the [place] FDA and  has been authorized for detection and/or diagnosis of SARS-CoV-2 by FDA under an Emergency Use Authorization (EUA). This EUA will remain  in effect (meaning this test can be used) for the duration of the COVID-19 declaration under Section 564(b)(1) of the Act, 21 U.S.C.section 360bbb-3(b)(1), unless the authorization is terminated  or revoked sooner.       Influenza A by PCR NEGATIVE NEGATIVE Final   Influenza B by PCR NEGATIVE NEGATIVE Final    Comment: (NOTE) The Xpert Xpress SARS-CoV-2/FLU/RSV plus assay is intended as an aid in the diagnosis of influenza from Nasopharyngeal swab specimens and should not be used as a sole basis for treatment. Nasal washings and aspirates are unacceptable for Xpert Xpress SARS-CoV-2/FLU/RSV testing.  Fact Sheet for Patients: EntrepreneurPulse.com.au  Fact Sheet for Healthcare Providers: IncredibleEmployment.be  This test is not yet approved or cleared by the [place] FDA and has been authorized for detection and/or diagnosis of SARS-CoV-2 by FDA under an Emergency Use Authorization (EUA). This EUA will remain in effect (meaning this  test can be used) for the duration of the COVID-19 declaration under Section 564(b)(1) of the Act, 21 U.S.C. section 360bbb-3(b)(1), unless the authorization is terminated or revoked.  Performed at [facility], [place] [address], [place], [place] [address]       Labs: Basic Metabolic Panel: Recent Labs  Lab 10/03/21 0520  NA 134*  K 3.8  CL 100  CO2 24  GLUCOSE 147*  BUN 9  CREATININE 0.98  CALCIUM 9.1   Liver Function Tests: No results for input(s): AST, ALT, ALKPHOS, BILITOT, PROT, ALBUMIN in the last 168 hours. No results for input(s): LIPASE, AMYLASE in the last 168 hours. No results for input(s): AMMONIA in the last 168 hours. CBC: Recent Labs  Lab 10/03/21 0520  WBC 10.1  NEUTROABS 7.7  HGB 8.8*  HCT 28.3*  MCV 90.7  PLT 230   Cardiac Enzymes: No results for input(s): CKTOTAL, CKMB, CKMBINDEX, TROPONINI in the last 168 hours. BNP: BNP (last 3 results) Recent Labs    09/02/21 0447 09/20/21 1126 10/03/21 0520  BNP 539.6* 423.8* 800.5*    ProBNP (last 3 results) No results for input(s): PROBNP in the last 8760 hours.  CBG: Recent Labs  Lab 10/03/21 1148  GLUCAP 184*       Signed:  Dia Crawford, MD [facility]

## 2021-10-05 NOTE — Progress Notes (Signed)
Palliative Medicine Inpatient Follow Up Note  Reason for Consultation: Establishing goals of care   HPI/Patient Profile: 66 y.o. female  with past medical history of severe CAD s/p stents and CABG, COPD on home O2, DM, HLD, ESRD, HTN, OSA not on CPAP, afib, and RA presented to ED on 10/03/21 with complaints of shortness of breath. Of note, patient has had 6 admissions in the last 6 months with a 30 day readmission - she is familiar to PMT from previous hospitalizations. She was last admitted 2/4-2/10/23 for NSTEMI and discharged with [facility]. Patient was admitted on 10/03/2021 with ACS, acute on chronic diastolic CHF, chronic respiratory failure with hypoxia, paroxysmal atrial fibrillation.  Today's Discussion (10/05/2021):  *Please note that this is a verbal dictation therefore any spelling or grammatical errors are due to the "Dragon Medical One" system interpretation.  Chart reviewed inclusive of vital signs, progress notes, laboratory results, and diagnostic images.   I met with Bently at bedside this morning.  She was receiving a breathing treatment at this time though she shares with me that her anxiety is better today.  She was thankful for the time we spent together yesterday discussing her fears for the future.  Reviewed that from a breathing perspective she is feeling better with additional diuretic.  I asked Saydee if she felt ready to go home today with hospice and she endorsed that she does.  Questions and concerns addressed   Palliative Support Provided ______________________________________________ Addendum:  I reach out to Dr. Joseph Art and shared with him that I would pend her palliative medications for him in anticipation of discharge this morning.  _______________________________________________ Addendum 2:  Patient's discharge happened more quickly than was expected and patient and I discussed over the phone her concerns in terms of her oxygen  levels.  Reviewed that because she has COPD she typically rides a little bit lower than the average person and reviewed the physiology behind that.  She was thankful for this explanation and did endorse feeling ready to go home afterwards.  Objective Assessment: Vital Signs Vitals:   10/05/21 0433 10/05/21 0824  BP: (!) 108/58   Pulse: 66   Resp: (!) 24   Temp: 98.2 F (36.8 C)   SpO2: (!) 89% 91%    Intake/Output Summary (Last 24 hours) at 10/05/2021 1046 Last data filed at 10/05/2021 0435 Gross per 24 hour  Intake 480 ml  Output 350 ml  Net 130 ml    Last Weight  Most recent update: 10/03/2021  9:58 PM    Weight  90 kg (198 lb 6.4 oz)            Gen:  Older Caucasian F in NAD HEENT: moist mucous membranes CV: Regular rate and rhythm  PULM: On 4LPM [place], (+) Bilateral crackles ABD: soft/nontender  EXT: (+) BLE edema Neuro: Alert and oriented x3  SUMMARY OF RECOMMENDATIONS   Continue full comfort measures Continue DNR/DNI as previously documented Continue EOL symptom management and to reflect full comfort measures, as well as discontinued orders that were not focused on comfort Continue Ativa TID Discharge home today  MDM - [place] ______________________________________________________________________________________ Lamarr Lulas [place] Palliative Medicine Team Team Cell Phone: [phone] Please utilize secure chat with additional questions, if there is no response within 30 minutes please call the above phone number  Palliative Medicine Team providers are available by phone from 7am to 7pm daily and can be reached through the team cell phone.  Should this patient  require assistance outside of these hours, please call the patient's attending physician.

## 2021-10-14 ENCOUNTER — Other Ambulatory Visit: Payer: Self-pay | Admitting: Physical Medicine & Rehabilitation

## 2021-10-14 DIAGNOSIS — E1142 Type 2 diabetes mellitus with diabetic polyneuropathy: Secondary | ICD-10-CM

## 2021-10-14 DIAGNOSIS — I509 Heart failure, unspecified: Secondary | ICD-10-CM | POA: Diagnosis not present

## 2021-10-15 ENCOUNTER — Other Ambulatory Visit: Payer: Self-pay

## 2021-10-15 DIAGNOSIS — I5033 Acute on chronic diastolic (congestive) heart failure: Secondary | ICD-10-CM | POA: Diagnosis not present

## 2021-10-15 DIAGNOSIS — J9621 Acute and chronic respiratory failure with hypoxia: Secondary | ICD-10-CM | POA: Diagnosis not present

## 2021-10-15 DIAGNOSIS — I214 Non-ST elevation (NSTEMI) myocardial infarction: Secondary | ICD-10-CM | POA: Diagnosis not present

## 2021-10-15 DIAGNOSIS — F32A Depression, unspecified: Secondary | ICD-10-CM | POA: Diagnosis not present

## 2021-10-15 DIAGNOSIS — E118 Type 2 diabetes mellitus with unspecified complications: Secondary | ICD-10-CM | POA: Diagnosis not present

## 2021-10-15 DIAGNOSIS — F419 Anxiety disorder, unspecified: Secondary | ICD-10-CM | POA: Diagnosis not present

## 2021-10-15 DIAGNOSIS — I480 Paroxysmal atrial fibrillation: Secondary | ICD-10-CM | POA: Diagnosis not present

## 2021-10-15 DIAGNOSIS — I2510 Atherosclerotic heart disease of native coronary artery without angina pectoris: Secondary | ICD-10-CM | POA: Diagnosis not present

## 2021-10-15 DIAGNOSIS — I739 Peripheral vascular disease, unspecified: Secondary | ICD-10-CM

## 2021-10-15 DIAGNOSIS — J449 Chronic obstructive pulmonary disease, unspecified: Secondary | ICD-10-CM | POA: Diagnosis not present

## 2021-10-18 ENCOUNTER — Inpatient Hospital Stay (HOSPITAL_COMMUNITY)
Admission: EM | Admit: 2021-10-18 | Discharge: 2021-10-22 | DRG: 291 | Disposition: A | Discharge status: Hospice | Payer: Medicare Other | Attending: Internal Medicine | Admitting: Internal Medicine

## 2021-10-18 ENCOUNTER — Encounter (HOSPITAL_COMMUNITY): Payer: Self-pay | Admitting: Internal Medicine

## 2021-10-18 ENCOUNTER — Emergency Department (HOSPITAL_COMMUNITY): Payer: Medicare Other

## 2021-10-18 ENCOUNTER — Other Ambulatory Visit: Payer: Self-pay

## 2021-10-18 DIAGNOSIS — I4891 Unspecified atrial fibrillation: Secondary | ICD-10-CM

## 2021-10-18 DIAGNOSIS — Z8616 Personal history of COVID-19: Secondary | ICD-10-CM

## 2021-10-18 DIAGNOSIS — Z79899 Other long term (current) drug therapy: Secondary | ICD-10-CM

## 2021-10-18 DIAGNOSIS — Z9884 Bariatric surgery status: Secondary | ICD-10-CM

## 2021-10-18 DIAGNOSIS — Z66 Do not resuscitate: Secondary | ICD-10-CM | POA: Diagnosis present

## 2021-10-18 DIAGNOSIS — D649 Anemia, unspecified: Secondary | ICD-10-CM | POA: Diagnosis not present

## 2021-10-18 DIAGNOSIS — E1165 Type 2 diabetes mellitus with hyperglycemia: Secondary | ICD-10-CM | POA: Diagnosis not present

## 2021-10-18 DIAGNOSIS — Z9861 Coronary angioplasty status: Secondary | ICD-10-CM | POA: Diagnosis not present

## 2021-10-18 DIAGNOSIS — Z7901 Long term (current) use of anticoagulants: Secondary | ICD-10-CM

## 2021-10-18 DIAGNOSIS — Z20822 Contact with and (suspected) exposure to covid-19: Secondary | ICD-10-CM | POA: Diagnosis present

## 2021-10-18 DIAGNOSIS — E785 Hyperlipidemia, unspecified: Secondary | ICD-10-CM | POA: Diagnosis not present

## 2021-10-18 DIAGNOSIS — I2510 Atherosclerotic heart disease of native coronary artery without angina pectoris: Secondary | ICD-10-CM | POA: Diagnosis present

## 2021-10-18 DIAGNOSIS — Z9981 Dependence on supplemental oxygen: Secondary | ICD-10-CM

## 2021-10-18 DIAGNOSIS — Z8673 Personal history of transient ischemic attack (TIA), and cerebral infarction without residual deficits: Secondary | ICD-10-CM

## 2021-10-18 DIAGNOSIS — Z515 Encounter for palliative care: Secondary | ICD-10-CM

## 2021-10-18 DIAGNOSIS — Z940 Kidney transplant status: Secondary | ICD-10-CM | POA: Diagnosis not present

## 2021-10-18 DIAGNOSIS — I5023 Acute on chronic systolic (congestive) heart failure: Secondary | ICD-10-CM | POA: Diagnosis not present

## 2021-10-18 DIAGNOSIS — L89310 Pressure ulcer of right buttock, unstageable: Secondary | ICD-10-CM | POA: Diagnosis not present

## 2021-10-18 DIAGNOSIS — Z7401 Bed confinement status: Secondary | ICD-10-CM

## 2021-10-18 DIAGNOSIS — L0232 Furuncle of buttock: Secondary | ICD-10-CM | POA: Diagnosis present

## 2021-10-18 DIAGNOSIS — I110 Hypertensive heart disease with heart failure: Principal | ICD-10-CM | POA: Diagnosis present

## 2021-10-18 DIAGNOSIS — J9621 Acute and chronic respiratory failure with hypoxia: Secondary | ICD-10-CM | POA: Diagnosis not present

## 2021-10-18 DIAGNOSIS — I5043 Acute on chronic combined systolic (congestive) and diastolic (congestive) heart failure: Secondary | ICD-10-CM | POA: Diagnosis present

## 2021-10-18 DIAGNOSIS — Z87442 Personal history of urinary calculi: Secondary | ICD-10-CM

## 2021-10-18 DIAGNOSIS — E118 Type 2 diabetes mellitus with unspecified complications: Secondary | ICD-10-CM | POA: Diagnosis not present

## 2021-10-18 DIAGNOSIS — E1169 Type 2 diabetes mellitus with other specified complication: Secondary | ICD-10-CM | POA: Diagnosis not present

## 2021-10-18 DIAGNOSIS — Z800 Family history of malignant neoplasm of digestive organs: Secondary | ICD-10-CM

## 2021-10-18 DIAGNOSIS — I252 Old myocardial infarction: Secondary | ICD-10-CM

## 2021-10-18 DIAGNOSIS — R269 Unspecified abnormalities of gait and mobility: Secondary | ICD-10-CM | POA: Diagnosis present

## 2021-10-18 DIAGNOSIS — Z8249 Family history of ischemic heart disease and other diseases of the circulatory system: Secondary | ICD-10-CM

## 2021-10-18 DIAGNOSIS — I10 Essential (primary) hypertension: Secondary | ICD-10-CM | POA: Diagnosis not present

## 2021-10-18 DIAGNOSIS — Z888 Allergy status to other drugs, medicaments and biological substances status: Secondary | ICD-10-CM

## 2021-10-18 DIAGNOSIS — Z882 Allergy status to sulfonamides status: Secondary | ICD-10-CM

## 2021-10-18 DIAGNOSIS — E876 Hypokalemia: Secondary | ICD-10-CM | POA: Diagnosis present

## 2021-10-18 DIAGNOSIS — I509 Heart failure, unspecified: Secondary | ICD-10-CM | POA: Diagnosis not present

## 2021-10-18 DIAGNOSIS — I480 Paroxysmal atrial fibrillation: Secondary | ICD-10-CM | POA: Diagnosis not present

## 2021-10-18 DIAGNOSIS — I517 Cardiomegaly: Secondary | ICD-10-CM | POA: Diagnosis not present

## 2021-10-18 DIAGNOSIS — R5381 Other malaise: Secondary | ICD-10-CM | POA: Diagnosis not present

## 2021-10-18 DIAGNOSIS — R000 Tachycardia, unspecified: Secondary | ICD-10-CM | POA: Diagnosis not present

## 2021-10-18 DIAGNOSIS — H547 Unspecified visual loss: Secondary | ICD-10-CM | POA: Diagnosis present

## 2021-10-18 DIAGNOSIS — M069 Rheumatoid arthritis, unspecified: Secondary | ICD-10-CM | POA: Diagnosis not present

## 2021-10-18 DIAGNOSIS — Z794 Long term (current) use of insulin: Secondary | ICD-10-CM

## 2021-10-18 DIAGNOSIS — Z9049 Acquired absence of other specified parts of digestive tract: Secondary | ICD-10-CM

## 2021-10-18 DIAGNOSIS — Z951 Presence of aortocoronary bypass graft: Secondary | ICD-10-CM

## 2021-10-18 DIAGNOSIS — R739 Hyperglycemia, unspecified: Secondary | ICD-10-CM | POA: Diagnosis not present

## 2021-10-18 DIAGNOSIS — J449 Chronic obstructive pulmonary disease, unspecified: Secondary | ICD-10-CM | POA: Diagnosis present

## 2021-10-18 DIAGNOSIS — L03317 Cellulitis of buttock: Secondary | ICD-10-CM | POA: Diagnosis not present

## 2021-10-18 DIAGNOSIS — F419 Anxiety disorder, unspecified: Secondary | ICD-10-CM | POA: Diagnosis present

## 2021-10-18 DIAGNOSIS — K219 Gastro-esophageal reflux disease without esophagitis: Secondary | ICD-10-CM | POA: Diagnosis present

## 2021-10-18 DIAGNOSIS — N186 End stage renal disease: Secondary | ICD-10-CM | POA: Diagnosis not present

## 2021-10-18 DIAGNOSIS — F32A Depression, unspecified: Secondary | ICD-10-CM | POA: Diagnosis present

## 2021-10-18 DIAGNOSIS — Z7189 Other specified counseling: Secondary | ICD-10-CM | POA: Diagnosis not present

## 2021-10-18 DIAGNOSIS — E1151 Type 2 diabetes mellitus with diabetic peripheral angiopathy without gangrene: Secondary | ICD-10-CM | POA: Diagnosis present

## 2021-10-18 DIAGNOSIS — I6523 Occlusion and stenosis of bilateral carotid arteries: Secondary | ICD-10-CM | POA: Diagnosis present

## 2021-10-18 DIAGNOSIS — F17210 Nicotine dependence, cigarettes, uncomplicated: Secondary | ICD-10-CM | POA: Diagnosis present

## 2021-10-18 DIAGNOSIS — Z8614 Personal history of Methicillin resistant Staphylococcus aureus infection: Secondary | ICD-10-CM

## 2021-10-18 DIAGNOSIS — Z885 Allergy status to narcotic agent status: Secondary | ICD-10-CM

## 2021-10-18 DIAGNOSIS — R0902 Hypoxemia: Secondary | ICD-10-CM | POA: Diagnosis not present

## 2021-10-18 DIAGNOSIS — Z7951 Long term (current) use of inhaled steroids: Secondary | ICD-10-CM

## 2021-10-18 DIAGNOSIS — I499 Cardiac arrhythmia, unspecified: Secondary | ICD-10-CM | POA: Diagnosis not present

## 2021-10-18 DIAGNOSIS — N390 Urinary tract infection, site not specified: Secondary | ICD-10-CM | POA: Diagnosis present

## 2021-10-18 DIAGNOSIS — T8610 Unspecified complication of kidney transplant: Secondary | ICD-10-CM | POA: Diagnosis present

## 2021-10-18 DIAGNOSIS — I700 Atherosclerosis of aorta: Secondary | ICD-10-CM | POA: Diagnosis not present

## 2021-10-18 DIAGNOSIS — I5033 Acute on chronic diastolic (congestive) heart failure: Secondary | ICD-10-CM | POA: Diagnosis not present

## 2021-10-18 DIAGNOSIS — M797 Fibromyalgia: Secondary | ICD-10-CM | POA: Diagnosis present

## 2021-10-18 DIAGNOSIS — Z881 Allergy status to other antibiotic agents status: Secondary | ICD-10-CM

## 2021-10-18 DIAGNOSIS — I214 Non-ST elevation (NSTEMI) myocardial infarction: Secondary | ICD-10-CM | POA: Diagnosis not present

## 2021-10-18 DIAGNOSIS — R0602 Shortness of breath: Secondary | ICD-10-CM | POA: Diagnosis not present

## 2021-10-18 LAB — CBC WITH DIFFERENTIAL/PLATELET
Abs Immature Granulocytes: 0.02 10*3/uL (ref 0.00–0.07)
Basophils Absolute: 0 10*3/uL (ref 0.0–0.1)
Basophils Relative: 1 %
Eosinophils Absolute: 0.3 10*3/uL (ref 0.0–0.5)
Eosinophils Relative: 4 %
HCT: 34.8 % — ABNORMAL LOW (ref 36.0–46.0)
Hemoglobin: 10.7 g/dL — ABNORMAL LOW (ref 12.0–15.0)
Immature Granulocytes: 0 %
Lymphocytes Relative: 17 %
Lymphs Abs: 1 10*3/uL (ref 0.7–4.0)
MCH: 26.4 pg (ref 26.0–34.0)
MCHC: 30.7 g/dL (ref 30.0–36.0)
MCV: 85.7 fL (ref 80.0–100.0)
Monocytes Absolute: 0.5 10*3/uL (ref 0.1–1.0)
Monocytes Relative: 9 %
Neutro Abs: 4.3 10*3/uL (ref 1.7–7.7)
Neutrophils Relative %: 69 %
Platelets: 330 10*3/uL (ref 150–400)
RBC: 4.06 MIL/uL (ref 3.87–5.11)
RDW: 18.5 % — ABNORMAL HIGH (ref 11.5–15.5)
WBC: 6.2 10*3/uL (ref 4.0–10.5)
nRBC: 0 % (ref 0.0–0.2)

## 2021-10-18 LAB — COMPREHENSIVE METABOLIC PANEL
ALT: 23 U/L (ref 0–44)
AST: 57 U/L — ABNORMAL HIGH (ref 15–41)
Albumin: 2.9 g/dL — ABNORMAL LOW (ref 3.5–5.0)
Alkaline Phosphatase: 124 U/L (ref 38–126)
Anion gap: 15 (ref 5–15)
BUN: 10 mg/dL (ref 8–23)
CO2: 27 mmol/L (ref 22–32)
Calcium: 9.2 mg/dL (ref 8.9–10.3)
Chloride: 92 mmol/L — ABNORMAL LOW (ref 98–111)
Creatinine, Ser: 0.88 mg/dL (ref 0.44–1.00)
GFR, Estimated: >60 mL/min (ref >60)
Glucose, Bld: 303 mg/dL — ABNORMAL HIGH (ref 70–99)
Potassium: 4.7 mmol/L (ref 3.5–5.1)
Sodium: 134 mmol/L — ABNORMAL LOW (ref 135–145)
Total Bilirubin: 1.5 mg/dL — ABNORMAL HIGH (ref 0.3–1.2)
Total Protein: 6.9 g/dL (ref 6.5–8.1)

## 2021-10-18 LAB — RESP PANEL BY RT-PCR (FLU A&B, COVID) ARPGX2
Influenza A by PCR: NEGATIVE
Influenza B by PCR: NEGATIVE
SARS Coronavirus 2 by RT PCR: NEGATIVE

## 2021-10-18 LAB — BRAIN NATRIURETIC PEPTIDE: B Natriuretic Peptide: 666.7 pg/mL — ABNORMAL HIGH (ref 0.0–100.0)

## 2021-10-18 LAB — TROPONIN I (HIGH SENSITIVITY)
Troponin I (High Sensitivity): 179 ng/L (ref <18)
Troponin I (High Sensitivity): 172 ng/L (ref <18)

## 2021-10-18 MED ORDER — METOPROLOL TARTRATE 5 MG/5ML IV SOLN
5.0000 mg | Freq: Once | INTRAVENOUS | Status: AC
  Administered 2021-10-18: 5 mg via INTRAVENOUS
  Filled 2021-10-18: qty 5

## 2021-10-18 MED ORDER — FENTANYL CITRATE PF 50 MCG/ML IJ SOSY
50.0000 ug | PREFILLED_SYRINGE | Freq: Once | INTRAMUSCULAR | Status: AC
  Administered 2021-10-18: 50 ug via INTRAVENOUS
  Filled 2021-10-18 (×2): qty 1

## 2021-10-18 MED ORDER — FUROSEMIDE 10 MG/ML IJ SOLN
40.0000 mg | Freq: Once | INTRAMUSCULAR | Status: AC
Start: 2021-10-18 — End: 2021-10-18
  Administered 2021-10-18: 40 mg via INTRAVENOUS
  Filled 2021-10-18: qty 4

## 2021-10-18 NOTE — ED Triage Notes (Signed)
Pt bib [facility] from home for shortness of breath worsening today. EKG shows afib with left BBB with rate 80-120 bpm. Pt in hospice care. Denies chest pain or tightness. Pt arrives on 4L home o2.  ? ?CBG 427, BP: 152/80, Spo2 92% 4L, HR 80-100 ?

## 2021-10-18 NOTE — ED Provider Notes (Addendum)
[facility] EMERGENCY DEPARTMENT Provider Note   CSN: [id number] Arrival date & time: 10/18/21  1731     History  Chief Complaint  Patient presents with   Shortness of Breath    Katherine [name] is a 66 y.o. female.  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  He had worsening shortness of breath today and was in atrial fibrillation with RVR.  She states that she did not want to come to the hospital initially but her hospice nurse encouraged her to.  She is requesting diuresis palliatively due to her known CHF.  She has not had any increasing oxygen requirement and arrives on her 4 L home O2.  Vitals with EMS significant for BP 152/80, O2 sats 92% on 4 L, heart rate ranging between 80-120.  She currently denies any chest pain.  Home Medications Prior to Admission medications   Medication Sig Start Date End Date Taking? Authorizing Provider  albuterol (VENTOLIN HFA) 108 (90 Base) MCG/ACT inhaler TAKE 2 PUFFS BY MOUTH EVERY 6 HOURS AS NEEDED FOR WHEEZE OR SHORTNESS OF BREATH Patient taking differently: 2 puffs every 6 (six) hours as needed for wheezing or shortness of breath. 02/10/21   Byrum, Rose Fillers, MD  arformoterol (BROVANA) 15 MCG/2ML NEBU Take 2 mLs (15 mcg total) by nebulization 2 (two) times daily. 08/30/19   Collene Gobble, MD  azaTHIOprine (IMURAN) 50 MG tablet Take 125 mg by mouth See admin instructions. Take 2 1/2 tablets (125 mg) by mouth daily at 3pm    [provider]  bisacodyl (DULCOLAX) 5 MG EC tablet Take 1 tablet (5 mg total) by mouth daily as needed for moderate  constipation. 10/05/21   Allie Bossier, MD  budesonide (PULMICORT) 0.5 MG/2ML nebulizer solution Take 2 mLs (0.5 mg total) by nebulization 2 (two) times daily. 08/24/19   [name], MD  cetirizine (ZYRTEC) 5 MG tablet Take 5 mg by mouth daily.    [provider]  cyclobenzaprine (FLEXERIL) 10 MG tablet Take 1 tablet (10 mg total) by mouth 3 (three) times daily as needed for muscle spasms. 08/25/21   Kathie Dike, MD  docusate sodium (COLACE) 100 MG capsule Take 1 capsule (100 mg total) by mouth 2 (two) times daily. 10/05/21   Allie Bossier, MD  fluticasone (FLONASE) 50 MCG/ACT nasal spray PLACE 2 SPRAYS INTO BOTH NOSTRILS 2 TIMES DAILY. Patient taking differently: 2 sprays in the morning and at bedtime. 06/27/21   Collene Gobble, MD  gabapentin (NEURONTIN) 100 MG capsule Take 1 capsule (100 mg total) by mouth at bedtime. 09/11/21   Hosie Poisson, MD  isosorbide mononitrate (IMDUR) 60 MG 24 hr tablet Take 1 tablet (60 mg total) by mouth daily. 09/27/21 10/27/21  Arrien, Jimmy Picket, MD  lamoTRIgine (LAMICTAL) 25 MG tablet TAKE 1 TABLET BY MOUTH EVERYDAY AT BEDTIME Patient taking differently: Take 25 mg by mouth at bedtime. 07/25/21   Meredith Staggers, MD  LORazepam (ATIVAN) 2 MG/ML concentrated solution Take 0.5 mLs (1 mg total) by mouth 3 (three) times daily. 10/05/21   Allie Bossier, MD  LORazepam (ATIVAN) 2 MG/ML concentrated solution Place 0.5 mLs (1  mg total) under the tongue every hour as needed for anxiety, seizure, sedation or sleep (distress). 10/05/21   Allie Bossier, MD  metoprolol tartrate (LOPRESSOR) 25 MG tablet Take 1 tablet (25 mg total) by mouth 3 (three) times daily. 09/11/21   Hosie Poisson, MD  montelukast (SINGULAIR) 10 MG tablet TAKE 1 TABLET BY MOUTH EVERYDAY AT BEDTIME Patient taking differently: Take 10 mg by mouth at bedtime. 09/23/21   Collene Gobble, MD  Morphine Sulfate (MORPHINE CONCENTRATE) 10 MG/0.5ML SOLN concentrated solution Take 0.5 mLs (10 mg  total) by mouth every 6 (six) hours. 10/05/21   Allie Bossier, MD  Morphine Sulfate (MORPHINE CONCENTRATE) 10 MG/0.5ML SOLN concentrated solution Take 0.25-0.5 mLs (5-10 mg total) by mouth every 2 (two) hours as needed for severe pain, anxiety or shortness of breath (distress). 10/05/21   Allie Bossier, MD  OLANZapine (ZYPREXA) 5 MG tablet Take 5 mg by mouth at bedtime. 10/02/21   [provider]  ondansetron (ZOFRAN-ODT) 4 MG disintegrating tablet Take 1 tablet (4 mg total) by mouth every 8 (eight) hours. 10/05/21   Allie Bossier, MD  oxyCODONE (OXY IR/ROXICODONE) 5 MG immediate release tablet Take 5 mg by mouth 4 (four) times daily as needed for moderate pain. 09/29/21   [provider]  OXYGEN Inhale 4 L into the lungs continuous.    [provider]  pantoprazole (PROTONIX) 40 MG tablet TAKE 1 TABLET BY MOUTH EVERY DAY 10/14/21   Meredith Staggers, MD  polyethylene glycol (MIRALAX / GLYCOLAX) 17 g packet Take 17 g by mouth daily as needed for mild constipation. 10/05/21   Allie Bossier, MD  ranolazine (RANEXA) 500 MG 12 hr tablet Take 1 tablet (500 mg total) by mouth 2 (two) times daily. 09/11/21   Hosie Poisson, MD  sertraline (ZOLOFT) 100 MG tablet Take 1 tablet (100 mg total) by mouth daily. 06/06/21   [name], MD      Allergies    Tetracycline, Niacin, Niaspan [niacin er], Sulfa antibiotics, Sulfonamide derivatives, Codeine, Erythromycin, Hydromorphone hcl, Morphine and related, Nalbuphine, Sulfasalazine, and Tape    Review of Systems   Review of Systems  Respiratory:  Positive for shortness of breath.   Cardiovascular:  Positive for palpitations.  All other systems reviewed and are negative.  Physical Exam Updated Vital Signs BP 121/72    Pulse 91    Temp 97.9 F (36.6 C)    Resp 15    Ht '5\' 1"'$  (1.549 m)    Wt 86.2 kg    SpO2 91%    BMI 35.90 kg/m  Physical Exam Vitals and nursing note reviewed.  Constitutional:      General: She is not in  acute distress.    Comments: Ill appearing  HENT:     Head: Normocephalic and atraumatic.  Eyes:     Conjunctiva/sclera: Conjunctivae normal.     Pupils: Pupils are equal, round, and reactive to light.  Cardiovascular:     Rate and Rhythm: Tachycardia present. Rhythm irregular.  Pulmonary:     Effort: Pulmonary effort is normal. No respiratory distress.     Breath sounds: Rales present.  Abdominal:     General: There is no distension.     Tenderness: There is no guarding.  Musculoskeletal:        General: No deformity or signs of injury.     Cervical back: Neck supple.     Right lower leg: No edema.  Left lower leg: No edema.  Skin:    Findings: No lesion or rash.  Neurological:     General: No focal deficit present.     Mental Status: She is alert. Mental status is at baseline.    ED Results / Procedures / Treatments   Labs (all labs ordered are listed, but only abnormal results are displayed) Labs Reviewed  BRAIN NATRIURETIC PEPTIDE - Abnormal; Notable for the following components:      Result Value   B Natriuretic Peptide 666.7 (*)    All other components within normal limits  COMPREHENSIVE METABOLIC PANEL - Abnormal; Notable for the following components:   Sodium 134 (*)    Chloride 92 (*)    Glucose, Bld 303 (*)    Albumin 2.9 (*)    AST 57 (*)    Total Bilirubin 1.5 (*)    All other components within normal limits  CBC WITH DIFFERENTIAL/PLATELET - Abnormal; Notable for the following components:   Hemoglobin 10.7 (*)    HCT 34.8 (*)    RDW 18.5 (*)    All other components within normal limits  TROPONIN I (HIGH SENSITIVITY) - Abnormal; Notable for the following components:   Troponin I (High Sensitivity) 179 (*)    All other components within normal limits  RESP PANEL BY RT-PCR (FLU A&B, COVID) [id number]  TROPONIN I (HIGH SENSITIVITY)    EKG EKG Interpretation  Date/Time:  Saturday October 18 2021 17:41:40 EST Ventricular Rate:  114 PR Interval:    QRS  Duration: 146 QT Interval:  387 QTC Calculation: 533 R Axis:   -43 Text Interpretation: Atrial fibrillation with rapid ventricular response Ventricular premature complex Left bundle branch block Confirmed by Regan Lemming ([id number]) on 10/18/2021 6:02:14 PM  Radiology DG Chest Portable 1 View  Result Date: 10/18/2021 CLINICAL DATA:  Worsening shortness of breath today. EXAM: PORTABLE CHEST 1 VIEW COMPARISON:  Radiograph 10/03/2021, CT 08/05/2021 FINDINGS: Significant patient rotation. Post median sternotomy and CABG. Cardiomegaly stable. There are persistent bilateral heterogeneous pulmonary opacities with slight improvement from prior exam. No new or acute airspace disease. No pleural effusion or pneumothorax. No acute osseous findings. IMPRESSION: 1. Persistent bilateral heterogeneous pulmonary opacities with slight improvement from prior exam. This may represent multifocal pneumonia, scarring, or pulmonary edema. 2. Stable cardiomegaly. Electronically Signed   By: Keith Rake M.D.   On: 10/18/2021 18:24    Procedures Procedures    Medications Ordered in ED Medications  metoprolol tartrate (LOPRESSOR) injection 5 mg (5 mg Intravenous Given 10/18/21 1837)  furosemide (LASIX) injection 40 mg (40 mg Intravenous Given 10/18/21 1952)  fentaNYL (SUBLIMAZE) injection 50 mcg (50 mcg Intravenous Given 10/18/21 1951)    ED Course/ Medical Decision Making/ A&P                           Medical Decision Making Amount and/or Complexity of Data Reviewed Labs: ordered. Radiology: ordered.  Risk Prescription drug management. Decision regarding hospitalization.   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  He had worsening shortness of breath today and was in atrial fibrillation with RVR.  She states that she did not want  to come to the hospital initially but her hospice nurse encouraged her to.  She is requesting diuresis palliatively due to her known CHF.  She has not had any increasing oxygen requirement and arrives on her 4 L home O2.  Vitals with EMS significant for BP 152/80, O2 sats 92% on 4 L, heart rate ranging between 80-120.  She currently denies any chest pain.  On arrival, the patient was afebrile, tachycardic, initially saturating well on her home O2 via nasal cannula, hemodynamically stable.  She was found to be in atrial fibrillation with RVR on EKG and also on cardiac telemetry.  Ventricular rates ranged between the 110s to the low 120s.  Initial cardiac troponin revealed an elevation to 179 which is actually downtrending from her previous NSTEMI could be demand in the setting of known CHF and atrial fibrillation with RVR.  A BNP was elevated to 666.7, CBC was without a leukocytosis, stable anemia to 10.7, CMP with hyperglycemia, no evidence of DKA, no evidence of AKI.  I had a discussion regarding goals of care for the patient.  She remains a DNR and does not want chest compressions or intubation.  She would be amenable to cardioversion palliatively and is amenable to further medication management palliatively.  She had Rales on exam.  Concern for mild CHF exacerbation with additional concern for uncontrolled atrial fibrillation.  The patient was administered 5 mg of IV metoprolol with good response.  She was administered 40 mg of IV Lasix.  She was observed in the emergency department with good urine output noted.  She is at her baseline oxygen requirement.  On reassessment, the patient felt symptomatically unchanged.  I did discuss the care of the patient with Dr. Hal Hope and discussed further diuresis palliatively.   Following reassessment, discussion with the patient, Dr. Hal Hope excepted the patient in admission.   Final Clinical Impression(s) / ED Diagnoses Final diagnoses:  Acute on chronic congestive heart failure, unspecified heart failure type [facility])  Atrial fibrillation with rapid ventricular response [facility])  Palliative care encounter  Hospice care patient    Rx / DC Orders ED Discharge Orders     None         Regan Lemming, MD 10/18/21 2137    Regan Lemming, MD 10/18/21 2138

## 2021-10-18 NOTE — Plan of Care (Signed)

## 2021-10-18 NOTE — ED Notes (Signed)
ED TO INPATIENT HANDOFF REPORT  ED Nurse Name and Phone #: [phone]  S Name/Age/Gender Katherine [name] 66 y.o. female Room/Bed: 009C/009C  Code Status   Code Status: Prior  Home/SNF/Other Home Patient oriented to: self, place, time, and situation Is this baseline? Yes   Triage Complete: Triage complete  Chief Complaint Acute CHF (congestive heart failure) ([place]) [I50.9]  Triage Note Pt bib [facility] from home for shortness of breath worsening today. EKG shows afib with left BBB with rate 80-120 bpm. Pt in hospice care. Denies chest pain or tightness. Pt arrives on 4L home o2.   CBG 427, BP: 152/80, Spo2 92% 4L, HR 80-100   Allergies Allergies  Allergen Reactions   Tetracycline Hives    Patient tolerated Doxycycline Dec 2020   Niacin Other (See Comments)    Mouth blisters   Niaspan [Niacin Er] Other (See Comments)    Mouth blisters   Sulfa Antibiotics Nausea Only and Other (See Comments)    "Tears up stomach"   Sulfonamide Derivatives Other (See Comments)    Reaction: per patient "tears her stomach up"   Codeine Nausea And Vomiting   Erythromycin Nausea And Vomiting   Hydromorphone Hcl Nausea And Vomiting   Morphine And Related Nausea And Vomiting   Nalbuphine Nausea And Vomiting    Nubain   Sulfasalazine Nausea Only and Other (See Comments)    per patient "tears her stomach up", "Tears up stomach"   Tape Rash and Other (See Comments)    No "plastic" tape," please----cloth tape only    Level of Care/Admitting Diagnosis ED Disposition     ED Disposition  Admit   Condition  --   [place]: [place] [[id number]]  Level of Care: Telemetry Cardiac [103]  May place patient in observation at [facility] or [place] if equivalent level of care is available:: No  Covid Evaluation: Asymptomatic Screening Protocol (No Symptoms)  Diagnosis: Acute CHF (congestive heart failure) [facility]) [[id number]]  Admitting Physician: Rise Patience  [phone]  Attending Physician: Rise Patience Lei.Right          B Medical/Surgery History Past Medical History:  Diagnosis Date   Anemia    Anxiety    Bilateral carotid artery stenosis    Carotid duplex 09/5496: 2-64% LICA, 15-83% RICA, >09% RECA, f/u 1 yr suggested   CAD (coronary artery disease) of bypass graft 5/01; 3/'02, 8/'03, 10/'04; 1/15   PCI x 5 to SVG-D1    CAD in native artery 07/1993   3 Vessel Disease (LAD-D1 & RCA) -- CABG (Dx in setting of inferior STEMI-PTCA of RCA)   COPD mixed type ([place])    Followed by Dr. Lamonte Sakai "pulmonologist said no COPD"   Depression with anxiety    Diabetes mellitus type 2 in obese ([place])    Diarrhea    started after cholecystectomy and mass removed from intestine   Dyslipidemia, goal LDL below 70    08/2012: TC 137, TG 200, HDL 32!, LDL 45; on statin (followed by Dr.Deterding)   ESRD (end stage renal disease) ([place]) 1991   s/p Cadaveric Renal Transplant [facility] - Dr. Jimmy Footman)    Family history of adverse reaction to anesthesia    mom's bp dropped during/after anesthesia   Fibromyalgia    GERD (gastroesophageal reflux disease)    H/O: GI bleed    History of kidney stones    History of stroke 2012   "right eye stroke- half blind now"   History  of [place] po[place] due to drug 05/11/2021   Witnessed syncopal event.  Had having having lots of nausea and vomiting with poor p.o. intake.  Thought to have QT prolongation with multiple medications involved and hypomagnesemia, hypokalemia.  Tikosyn discontinued along with Zoloft and Phenergan.   Hypertension associated with diabetes ([place])    Mild aortic stenosis by prior echocardiogram 07/2019   Echo:  Mild aortic stenosis (gradients: Mean 14.3 mmHg -peak 24.9 mmHg).   Morbid obesity (H[facility])    MRSA (methicillin resistant staph aureus) culture positive    OSA (obstructive sleep apnea)    no longer on CPAP or home O2, states she doesn't need now after lap band   PAD (peripheral artery disease)  ([place]) 08/2013   LEA Dopplers to be read by Dr. Fletcher Anon   PAF (paroxysmal atrial fibrillation) ([place]) 06/2014   Noted on CardioNet Monitor  - --> rhythm control with Tikosyn (Dr. Rayann Heman); converted from warfarin to apixaban for anticoagulation.   Pneumonia    Recurrent boils    Bilateral Groin   Rheumatoid arthritis ([place])    Per Patient Report; associated with OA   S/p cadaver renal transplant 1991   [facility]   Past Surgical History:  Procedure Laterality Date   ABDOMINAL AORTOGRAM N/A 04/21/2018   Procedure: ABDOMINAL AORTOGRAM;  Surgeon: [name], MD;  Location: [place] [facility];  Service: Cardiovascular;  Laterality: N/A;   CATHETER REMOVAL     CHOLECYSTECTOMY N/A 10/29/2014   Procedure: LAPAROSCOPIC CHOLECYSTECTOMY WITH INTRAOPERATIVE CHOLANGIOGRAM;  Surgeon: Excell Seltzer, MD;  Location: [facility];  Service: General;  Laterality: N/A;   CORONARY ANGIOPLASTY  1994   x5   CORONARY ARTERY BYPASS GRAFT  1995   LIMA-LAD, SVG-RPDA, SVG-D1   CORONARY STENT INTERVENTION N/A 02/20/2021   Procedure: PERCUTANEOUS CORONARY STENT INTERVENTION;  Surgeon: [name], MD;  Location: [place] [facility]; ostLCx 60% (Neg RFR 0.96);; SVG- D2 recurrent 90% ISR & 95% native D2 after graft-> DES PCI of 95% anastomotic D2 lesion (Onyx Frontier 2.25 mm x 12 mm => 2.75 mm @ overlap, 2.5 distal.);PTCA of ISR in body of graft. ->  2.5 mm scoring balloon & post-dil w/ 2.75 mm [place] balloon   ESOPHAGOGASTRODUODENOSCOPY N/A 10/15/2016   Procedure: ESOPHAGOGASTRODUODENOSCOPY (EGD);  Surgeon: Wilford Corner, MD;  Location: [facility] ENDOSCOPY;  Service: Endoscopy;  Laterality: N/A;   I & D EXTREMITY Right 01/29/2018   Procedure: IRRIGATION AND DEBRIDEMENT THUMB;  Surgeon: Dayna Barker, MD;  Location: [place];  Service: Plastics;  Laterality: Right;   INCISE AND DRAIN ABCESS     INTRAVASCULAR PRESSURE WIRE/FFR STUDY N/A 02/20/2021   Procedure: INTRAVASCULAR PRESSURE WIRE/FFR STUDY;  Surgeon: [name], MD;   Location: [place] [facility];  Service: Cardiovascular;  Laterality: N/A;   KIDNEY TRANSPLANT  1991   KNEE ARTHROSCOPY WITH LATERAL MENISECTOMY Left 12/03/2017   Procedure: LEFT KNEE ARTHROSCOPY WITH LATERAL MENISECTOMY;  Surgeon: Earlie Server, MD;  Location: [place];  Service: Orthopedics;  Laterality: Left;   LAPAROSCOPIC GASTRIC BANDING  04/2004; 10/'09, 2/'10   Port Replacement x 2   LEFT HEART CATH AND CORONARY ANGIOGRAPHY N/A 02/20/2021   Procedure: LEFT HEART CATH AND CORONARY ANGIOGRAPHY;  Surgeon: [name], MD;  Location: [place] [facility];  Service: Cardiovascular;  Laterality: N/A;   LEFT HEART CATH AND CORS/GRAFTS ANGIOGRAPHY N/A 04/21/2018   Procedure: LEFT HEART CATH AND CORS/GRAFTS ANGIOGRAPHY;  Surgeon: [name], MD;  Location: [place] [facility];  Ost-Prox LAD 50% - proxLAD (pre & post D1) 100% CTO. Cx - patent, small OM1 (stable ~ ostial OM1 90%, too small for PCI) & 2 small LPL; Ost-distal RCA 100% CTO.  LIMA-LAD (not injected); SVG-dRCA patent, SVG-D1 - insertion stent ~20% ISR - Severe R CFA disease w/ focal Sub TO   LEFT HEART CATH AND CORS/GRAFTS ANGIOGRAPHY  5/'01, 3/'02, 8/'03, 10/'04; 1/'15   08/22/2013: LAD & RCA 100%; LIMA-LAD & SVG-rPDA patent; Cx-- OM1 60%, OM2 ostial ~50%; SVG-D1 - 80% mid, 50% distal ISR --PCI   LEFT HEART CATH AND CORS/GRAFTS ANGIOGRAPHY N/A 01/31/2021   Procedure: LEFT HEART CATH AND CORS/GRAFTS ANGIOGRAPHY;  Surgeon: Jolaine Artist, MD;  Location: [place] [facility];;   LEFT HEART CATH AND CORS/GRAFTS ANGIOGRAPHY N/A 09/05/2021   Procedure: LEFT HEART CATH AND CORS/GRAFTS ANGIOGRAPHY;  Surgeon: [name], MD;  Location: [place] [facility];  Service: Cardiovascular;  Laterality: N/A;   LEFT HEART CATH AND CORS/GRAFTS ANGIOGRAPHY N/A 08/21/2021   Procedure: LEFT HEART CATH AND CORS/GRAFTS ANGIOGRAPHY;  Surgeon: Belva Crome, MD;  Location: [place] [facility];  Service: Cardiovascular;  Laterality: N/A;   LEFT HEART  CATHETERIZATION WITH CORONARY/GRAFT ANGIOGRAM N/A 08/23/2013   Procedure: LEFT HEART CATHETERIZATION WITH Beatrix Fetters;  Surgeon: [place] Hampshire, MD;  Location: [place] CATH LAB;  Service: Cardiovascular;  Laterality: N/A;   Lower Extremity Arterial Dopplers  08/2013   ABI: R 0.96, L 1.04   MULTIPLE TOOTH EXTRACTIONS  age 46   NM MYOVIEW LTD  03/2016   EF 62%. LOW RISK. C/W prior MI - no Ischemia. Apical hypokinesis.   PERCUTANEOUS CORONARY STENT INTERVENTION (PCI-S)  5/'01, 3/'02, 8/'03, 10/'04;   '01 - S660 BMS 2.5 x 9 - dSVG-D1 into D1; '02- post-stent stenosis - 2.5 x 8 Pixel BMS; '8\03: ISR/Thrombosis into native D1 - AngioJet, 2.5 x 13 Pixel; '04 - ISR 95% - covered stented area with Taxus DES 2.5 mm x 20 (2.88)   PERCUTANEOUS CORONARY STENT INTERVENTION (PCI-S)  08/23/2013   Procedure: PERCUTANEOUS CORONARY STENT INTERVENTION (PCI-S);  Surgeon: [place] Hampshire, MD;  Location: [facility] CATH LAB;  Service: Cardiovascular;;mid SVG-D1 80%; distal stent ~50% ISR; Promus Prermier DES 2.75 mm xc 20 mm (2.8 mm)   PORT-A-CATH REMOVAL     kidney   TRANSTHORACIC ECHOCARDIOGRAM  07/2019   a) 07/2019: EF 55 to 60%.  No LVH.  Paradoxical septal WM-s/p CABG.  GRII DD.  Nl RV size and fxn.  Mild bilateral atrial dilation.  Mod MAC.  Trace MR.  Mild AS (gradients: Mean 14.3 mmHg -peak 24.9 mmHg).; B) 06/2020: EF 40 to 45%.  Moderate concentric LVH.  GRII DD.  Elevated LAP.  Mod HK mid Apical Ant-AntSept wall & mild Apical Dyskinesis.  Mod LA dilation.  Mild MR.  AoV sclerosis w/o AS.   TRANSTHORACIC ECHOCARDIOGRAM  01/30/2021   EF 55 to 60%.  Mild LVH.  GR 1 DD.  Elevated LAP.  Moderate LA dilation.  Mild MR with mild MS.  Mild aortic valve stenosis.:   TUBAL LIGATION     wrist fistula repair Left    dialysis for one year     A IV Location/Drains/Wounds Patient Lines/Drains/Airways Status     Active Line/Drains/Airways     Name Placement date Placement time Site Days   Peripheral IV 10/18/21  20 G Right Hand 10/18/21  1759  Hand  less than 1   External Urinary Catheter 10/03/21  2226  --  15            Intake/Output Last 24 hours No intake or output data in the 24 hours ending 10/18/21 2222  Labs/Imaging Results for orders placed or performed during the hospital encounter of 10/18/21 (from the past 48 hour(s))  Brain natriuretic peptide     Status: Abnormal   Collection Time: 10/18/21  6:02 PM  Result Value Ref Range   B Natriuretic Peptide 666.7 (H) 0.0 - 100.0 pg/mL    Comment: Performed at [facility], [address] [place], [place] [address]  Comprehensive metabolic panel     Status: Abnormal   Collection Time: 10/18/21  6:02 PM  Result Value Ref Range   Sodium 134 (L) 135 - 145 mmol/L   Potassium 4.7 3.5 - 5.1 mmol/L    Comment: SLIGHT HEMOLYSIS   Chloride 92 (L) 98 - 111 mmol/L   CO2 27 22 - 32 mmol/L   Glucose, Bld 303 (H) 70 - 99 mg/dL    Comment: Glucose reference range applies only to samples taken after fasting for at least 8 hours.   BUN 10 8 - 23 mg/dL   Creatinine, Ser 0.88 0.44 - 1.00 mg/dL   Calcium 9.2 8.9 - 10.3 mg/dL   Total Protein 6.9 6.5 - 8.1 g/dL   Albumin 2.9 (L) 3.5 - 5.0 g/dL   AST 57 (H) 15 - 41 U/L   ALT 23 0 - 44 U/L   Alkaline Phosphatase 124 38 - 126 U/L   Total Bilirubin 1.5 (H) 0.3 - 1.2 mg/dL   GFR, Estimated >60 >60 mL/min    Comment: (NOTE) Calculated using the CKD-EPI Creatinine Equation (2021)    Anion gap 15 5 - 15    Comment: Performed at [facility], [place] [address], [place], [place] [address]  Troponin I (High Sensitivity)     Status: Abnormal   Collection Time: 10/18/21  6:02 PM  Result Value Ref Range   Troponin I (High Sensitivity) 179 (HH) <18 ng/L    Comment: CRITICAL RESULT CALLED TO, READ BACK BY AND VERIFIED WITH: R.Elyanah Farino,RN 10/18/2021 AT 1845 A.HUGHES (NOTE) Elevated high sensitivity troponin I (hsTnI) values and significant  changes across serial measurements may suggest ACS but many  other  chronic and acute conditions are known to elevate hsTnI results.  Refer to the Links section for chest pain algorithms and additional  guidance. Performed at [facility], [place] [address], [place], [place] [address]   CBC with Differential     Status: Abnormal   Collection Time: 10/18/21  6:02 PM  Result Value Ref Range   WBC 6.2 4.0 - 10.5 K/uL   RBC 4.06 3.87 - 5.11 MIL/uL   Hemoglobin 10.7 (L) 12.0 - 15.0 g/dL   HCT 34.8 (L) 36.0 - 46.0 %   MCV 85.7 80.0 - 100.0 fL   MCH 26.4 26.0 - 34.0 pg   MCHC 30.7 30.0 - 36.0 g/dL   RDW 18.5 (H) 11.5 - 15.5 %   Platelets 330 150 - 400 K/uL    Comment: REPEATED TO VERIFY   nRBC 0.0 0.0 - 0.2 %   Neutrophils Relative % 69 %   Neutro Abs 4.3 1.7 - 7.7 K/uL   Lymphocytes Relative 17 %   Lymphs Abs 1.0 0.7 - 4.0 K/uL   Monocytes Relative 9 %   Monocytes Absolute 0.5 0.1 - 1.0 K/uL   Eosinophils Relative 4 %   Eosinophils Absolute 0.3 0.0 -  0.5 K/uL   Basophils Relative 1 %   Basophils Absolute 0.0 0.0 - 0.1 K/uL   Immature Granulocytes 0 %   Abs Immature Granulocytes 0.02 0.00 - 0.07 K/uL    Comment: Performed at [facility], [place] [address], [place], [place] [address]  Troponin I (High Sensitivity)     Status: Abnormal   Collection Time: 10/18/21  8:02 PM  Result Value Ref Range   Troponin I (High Sensitivity) 172 (HH) <18 ng/L    Comment: CRITICAL VALUE NOTED.  VALUE IS CONSISTENT WITH PREVIOUSLY REPORTED AND CALLED VALUE. (NOTE) Elevated high sensitivity troponin I (hsTnI) values and significant  changes across serial measurements may suggest ACS but many other  chronic and acute conditions are known to elevate hsTnI results.  Refer to the Links section for chest pain algorithms and additional  guidance. Performed at [facility], [place] [address] [place], [place] [address]    *Note: Due to a large number of results and/or encounters for the requested time period, some results have not been displayed. A  complete set of results can be found in Results Review.   DG Chest Portable 1 View  Result Date: 10/18/2021 CLINICAL DATA:  Worsening shortness of breath today. EXAM: PORTABLE CHEST 1 VIEW COMPARISON:  Radiograph 10/03/2021, CT 08/05/2021 FINDINGS: Significant patient rotation. Post median sternotomy and CABG. Cardiomegaly stable. There are persistent bilateral heterogeneous pulmonary opacities with slight improvement from prior exam. No new or acute airspace disease. No pleural effusion or pneumothorax. No acute osseous findings. IMPRESSION: 1. Persistent bilateral heterogeneous pulmonary opacities with slight improvement from prior exam. This may represent multifocal pneumonia, scarring, or pulmonary edema. 2. Stable cardiomegaly. Electronically Signed   By: Keith Rake M.D.   On: 10/18/2021 18:24    Pending Labs Unresulted Labs (From admission, onward)     Start     Ordered   10/18/21 2109  Resp Panel by RT-PCR (Flu A&B, Covid) Nasopharyngeal Swab  (Tier 2 - Symptomatic/asymptomatic)  Once,   STAT        10/18/21 2108            Vitals/Pain Today's Vitals   10/18/21 1915 10/18/21 1930 10/18/21 1945 10/18/21 2045  BP: 123/79 115/84 124/69 121/72  Pulse: 79 81 78 91  Resp: 14 (!) 25 (!) 22 15  Temp:      SpO2: 96% 93% 97% 91%  Weight:      Height:      PainSc:        Isolation Precautions No active isolations  Medications Medications  metoprolol tartrate (LOPRESSOR) injection 5 mg (5 mg Intravenous Given 10/18/21 1837)  furosemide (LASIX) injection 40 mg (40 mg Intravenous Given 10/18/21 1952)  fentaNYL (SUBLIMAZE) injection 50 mcg (50 mcg Intravenous Given 10/18/21 1951)    Mobility non-ambulatory High fall risk   Focused Assessments Pulmonary Assessment Handoff:  Lung sounds: Bilateral Breath Sounds: Diminished L Breath Sounds: Diminished R Breath Sounds: Diminished O2 Device: Nasal Cannula O2 Flow Rate (L/min): 4 L/min    R Recommendations: See Admitting  Provider Note  Report given to:   Additional Notes:

## 2021-10-18 NOTE — ED Notes (Signed)
MD notified of troponin of 179.  ?

## 2021-10-19 ENCOUNTER — Inpatient Hospital Stay (HOSPITAL_COMMUNITY): Payer: Medicare Other

## 2021-10-19 DIAGNOSIS — Z515 Encounter for palliative care: Secondary | ICD-10-CM

## 2021-10-19 DIAGNOSIS — Z9861 Coronary angioplasty status: Secondary | ICD-10-CM

## 2021-10-19 DIAGNOSIS — N186 End stage renal disease: Secondary | ICD-10-CM

## 2021-10-19 DIAGNOSIS — I509 Heart failure, unspecified: Secondary | ICD-10-CM

## 2021-10-19 DIAGNOSIS — I10 Essential (primary) hypertension: Secondary | ICD-10-CM

## 2021-10-19 DIAGNOSIS — I2510 Atherosclerotic heart disease of native coronary artery without angina pectoris: Secondary | ICD-10-CM

## 2021-10-19 DIAGNOSIS — E1169 Type 2 diabetes mellitus with other specified complication: Secondary | ICD-10-CM

## 2021-10-19 DIAGNOSIS — E785 Hyperlipidemia, unspecified: Secondary | ICD-10-CM

## 2021-10-19 DIAGNOSIS — I4891 Unspecified atrial fibrillation: Secondary | ICD-10-CM

## 2021-10-19 LAB — CBC
HCT: 36 % (ref 36.0–46.0)
Hemoglobin: 10.8 g/dL — ABNORMAL LOW (ref 12.0–15.0)
MCH: 25.5 pg — ABNORMAL LOW (ref 26.0–34.0)
MCHC: 30 g/dL (ref 30.0–36.0)
MCV: 84.9 fL (ref 80.0–100.0)
Platelets: 414 10*3/uL — ABNORMAL HIGH (ref 150–400)
RBC: 4.24 MIL/uL (ref 3.87–5.11)
RDW: 18.2 % — ABNORMAL HIGH (ref 11.5–15.5)
WBC: 6.7 10*3/uL (ref 4.0–10.5)
nRBC: 0 % (ref 0.0–0.2)

## 2021-10-19 LAB — BASIC METABOLIC PANEL
Anion gap: 13 (ref 5–15)
BUN: 9 mg/dL (ref 8–23)
CO2: 28 mmol/L (ref 22–32)
Calcium: 8.8 mg/dL — ABNORMAL LOW (ref 8.9–10.3)
Chloride: 90 mmol/L — ABNORMAL LOW (ref 98–111)
Creatinine, Ser: 0.91 mg/dL (ref 0.44–1.00)
GFR, Estimated: >60 mL/min (ref >60)
Glucose, Bld: 316 mg/dL — ABNORMAL HIGH (ref 70–99)
Potassium: 3.8 mmol/L (ref 3.5–5.1)
Sodium: 131 mmol/L — ABNORMAL LOW (ref 135–145)

## 2021-10-19 LAB — GLUCOSE, CAPILLARY
Glucose-Capillary: 287 mg/dL — ABNORMAL HIGH (ref 70–99)
Glucose-Capillary: 363 mg/dL — ABNORMAL HIGH (ref 70–99)
Glucose-Capillary: 470 mg/dL — ABNORMAL HIGH (ref 70–99)
Glucose-Capillary: 438 mg/dL — ABNORMAL HIGH (ref 70–99)

## 2021-10-19 LAB — APTT: aPTT: 42 seconds — ABNORMAL HIGH (ref 24–36)

## 2021-10-19 LAB — TSH: TSH: 1.872 u[IU]/mL (ref 0.350–4.500)

## 2021-10-19 LAB — HEPARIN LEVEL (UNFRACTIONATED): Heparin Unfractionated: 0.16 IU/mL — ABNORMAL LOW (ref 0.30–0.70)

## 2021-10-19 MED ORDER — SODIUM CHLORIDE 0.9 % IV SOLN
2.0000 g | Freq: Once | INTRAVENOUS | Status: AC
  Administered 2021-10-19: 2 g via INTRAVENOUS
  Filled 2021-10-19: qty 2

## 2021-10-19 MED ORDER — METOPROLOL TARTRATE 25 MG PO TABS
25.0000 mg | ORAL_TABLET | Freq: Three times a day (TID) | ORAL | Status: DC
  Administered 2021-10-19 – 2021-10-22 (×10): 25 mg via ORAL
  Filled 2021-10-19 (×10): qty 1

## 2021-10-19 MED ORDER — LAMOTRIGINE 25 MG PO TABS
25.0000 mg | ORAL_TABLET | Freq: Every day | ORAL | Status: DC
  Administered 2021-10-19 – 2021-10-21 (×4): 25 mg via ORAL
  Filled 2021-10-19 (×5): qty 1

## 2021-10-19 MED ORDER — PANTOPRAZOLE SODIUM 40 MG PO TBEC
40.0000 mg | DELAYED_RELEASE_TABLET | Freq: Every day | ORAL | Status: DC
  Administered 2021-10-19 – 2021-10-22 (×4): 40 mg via ORAL
  Filled 2021-10-19 (×4): qty 1

## 2021-10-19 MED ORDER — LORAZEPAM 2 MG/ML PO CONC
1.0000 mg | Freq: Four times a day (QID) | ORAL | Status: DC | PRN
  Administered 2021-10-19 – 2021-10-22 (×9): 1 mg via ORAL
  Filled 2021-10-19 (×9): qty 1

## 2021-10-19 MED ORDER — RANOLAZINE ER 500 MG PO TB12
500.0000 mg | ORAL_TABLET | Freq: Two times a day (BID) | ORAL | Status: DC
  Administered 2021-10-19 – 2021-10-22 (×8): 500 mg via ORAL
  Filled 2021-10-19 (×8): qty 1

## 2021-10-19 MED ORDER — INSULIN ASPART 100 UNIT/ML IJ SOLN
8.0000 [IU] | Freq: Once | INTRAMUSCULAR | Status: AC
  Administered 2021-10-19: 8 [IU] via SUBCUTANEOUS

## 2021-10-19 MED ORDER — SERTRALINE HCL 100 MG PO TABS
100.0000 mg | ORAL_TABLET | Freq: Every day | ORAL | Status: DC
  Administered 2021-10-19 – 2021-10-22 (×4): 100 mg via ORAL
  Filled 2021-10-19 (×4): qty 1

## 2021-10-19 MED ORDER — HEPARIN (PORCINE) 25000 UT/250ML-% IV SOLN
1200.0000 [IU]/h | INTRAVENOUS | Status: DC
Start: 2021-10-19 — End: 2021-10-19
  Administered 2021-10-19: 1000 [IU]/h via INTRAVENOUS
  Filled 2021-10-19: qty 250

## 2021-10-19 MED ORDER — DOCUSATE SODIUM 100 MG PO CAPS
100.0000 mg | ORAL_CAPSULE | Freq: Two times a day (BID) | ORAL | Status: DC
Start: 2021-10-19 — End: 2021-10-22
  Administered 2021-10-19 – 2021-10-22 (×8): 100 mg via ORAL
  Filled 2021-10-19 (×8): qty 1

## 2021-10-19 MED ORDER — VANCOMYCIN HCL IN DEXTROSE 1-5 GM/200ML-% IV SOLN
1000.0000 mg | INTRAVENOUS | Status: DC
  Administered 2021-10-19 – 2021-10-21 (×2): 1000 mg via INTRAVENOUS
  Filled 2021-10-19 (×2): qty 200

## 2021-10-19 MED ORDER — PREDNISONE 5 MG PO TABS
5.0000 mg | ORAL_TABLET | Freq: Every day | ORAL | Status: DC
  Administered 2021-10-19 – 2021-10-22 (×4): 5 mg via ORAL
  Filled 2021-10-19 (×4): qty 1

## 2021-10-19 MED ORDER — SODIUM CHLORIDE 0.9 % IV SOLN
2.0000 g | Freq: Three times a day (TID) | INTRAVENOUS | Status: DC
  Administered 2021-10-19 – 2021-10-20 (×6): 2 g via INTRAVENOUS
  Filled 2021-10-19 (×6): qty 2

## 2021-10-19 MED ORDER — ONDANSETRON 4 MG PO TBDP
4.0000 mg | ORAL_TABLET | Freq: Three times a day (TID) | ORAL | Status: DC | PRN
  Administered 2021-10-20 (×2): 4 mg via ORAL
  Filled 2021-10-19 (×3): qty 1

## 2021-10-19 MED ORDER — INSULIN GLARGINE-YFGN 100 UNIT/ML ~~LOC~~ SOLN
10.0000 [IU] | Freq: Every day | SUBCUTANEOUS | Status: DC
  Administered 2021-10-19 – 2021-10-20 (×2): 10 [IU] via SUBCUTANEOUS
  Filled 2021-10-19 (×2): qty 0.1

## 2021-10-19 MED ORDER — ARFORMOTEROL TARTRATE 15 MCG/2ML IN NEBU
15.0000 ug | INHALATION_SOLUTION | Freq: Two times a day (BID) | RESPIRATORY_TRACT | Status: DC
  Administered 2021-10-19 – 2021-10-21 (×6): 15 ug via RESPIRATORY_TRACT
  Filled 2021-10-19 (×7): qty 2

## 2021-10-19 MED ORDER — MONTELUKAST SODIUM 10 MG PO TABS
10.0000 mg | ORAL_TABLET | Freq: Every day | ORAL | Status: DC
  Administered 2021-10-19 – 2021-10-21 (×4): 10 mg via ORAL
  Filled 2021-10-19 (×4): qty 1

## 2021-10-19 MED ORDER — VANCOMYCIN HCL 1750 MG/350ML IV SOLN
1750.0000 mg | Freq: Once | INTRAVENOUS | Status: AC
  Administered 2021-10-19: 1750 mg via INTRAVENOUS
  Filled 2021-10-19: qty 350

## 2021-10-19 MED ORDER — POLYETHYLENE GLYCOL 3350 17 G PO PACK
17.0000 g | PACK | Freq: Every day | ORAL | Status: DC | PRN
  Administered 2021-10-22: 17 g via ORAL
  Filled 2021-10-19: qty 1

## 2021-10-19 MED ORDER — ONDANSETRON 4 MG PO TBDP
4.0000 mg | ORAL_TABLET | Freq: Three times a day (TID) | ORAL | Status: DC
Start: 2021-10-19 — End: 2021-10-19
  Administered 2021-10-19 (×2): 4 mg via ORAL
  Filled 2021-10-19 (×4): qty 1

## 2021-10-19 MED ORDER — FUROSEMIDE 10 MG/ML IJ SOLN
20.0000 mg | Freq: Every day | INTRAMUSCULAR | Status: DC
Start: 2021-10-19 — End: 2021-10-20
  Administered 2021-10-19 – 2021-10-20 (×2): 20 mg via INTRAVENOUS
  Filled 2021-10-19 (×2): qty 2

## 2021-10-19 MED ORDER — MORPHINE SULFATE (CONCENTRATE) 10 MG/0.5ML PO SOLN
10.0000 mg | ORAL | Status: DC | PRN
  Administered 2021-10-19 – 2021-10-20 (×5): 10 mg via ORAL
  Filled 2021-10-19 (×5): qty 0.5

## 2021-10-19 MED ORDER — APIXABAN 5 MG PO TABS
5.0000 mg | ORAL_TABLET | Freq: Two times a day (BID) | ORAL | Status: DC
  Administered 2021-10-19 – 2021-10-22 (×7): 5 mg via ORAL
  Filled 2021-10-19 (×7): qty 1

## 2021-10-19 MED ORDER — VANCOMYCIN HCL IN DEXTROSE 1-5 GM/200ML-% IV SOLN
1000.0000 mg | Freq: Two times a day (BID) | INTRAVENOUS | Status: DC
Start: 2021-10-19 — End: 2021-10-19
  Filled 2021-10-19: qty 200

## 2021-10-19 MED ORDER — CLOPIDOGREL BISULFATE 75 MG PO TABS
75.0000 mg | ORAL_TABLET | Freq: Every day | ORAL | Status: DC
  Administered 2021-10-19 – 2021-10-22 (×4): 75 mg via ORAL
  Filled 2021-10-19 (×4): qty 1

## 2021-10-19 MED ORDER — BISACODYL 5 MG PO TBEC: 5.0000 mg | DELAYED_RELEASE_TABLET | Freq: Every day | ORAL | Status: DC | PRN

## 2021-10-19 MED ORDER — AZATHIOPRINE 50 MG PO TABS
125.0000 mg | ORAL_TABLET | Freq: Every day | ORAL | Status: DC
Start: 2021-10-19 — End: 2021-10-22
  Administered 2021-10-19 – 2021-10-21 (×3): 125 mg via ORAL
  Filled 2021-10-19 (×4): qty 3

## 2021-10-19 MED ORDER — ONDANSETRON 4 MG PO TBDP
4.0000 mg | ORAL_TABLET | Freq: Three times a day (TID) | ORAL | Status: DC | PRN
  Filled 2021-10-19: qty 1

## 2021-10-19 MED ORDER — CYCLOBENZAPRINE HCL 10 MG PO TABS: 10.0000 mg | ORAL_TABLET | Freq: Three times a day (TID) | ORAL | Status: DC | PRN

## 2021-10-19 MED ORDER — ISOSORBIDE MONONITRATE ER 60 MG PO TB24
60.0000 mg | ORAL_TABLET | Freq: Every day | ORAL | Status: DC
  Administered 2021-10-19 – 2021-10-22 (×3): 60 mg via ORAL
  Filled 2021-10-19 (×4): qty 1

## 2021-10-19 MED ORDER — GABAPENTIN 100 MG PO CAPS
100.0000 mg | ORAL_CAPSULE | Freq: Every day | ORAL | Status: DC
  Administered 2021-10-19 – 2021-10-21 (×4): 100 mg via ORAL
  Filled 2021-10-19 (×4): qty 1

## 2021-10-19 NOTE — Plan of Care (Signed)

## 2021-10-19 NOTE — Progress Notes (Signed)
PROGRESS NOTE    Katherine [name]  JJH:[id number] DOB: Dec 02, 1955 DOA: 10/18/2021 PCP: [name], MD   Brief Narrative:  Per admitting MD: Katherine [name] is a 66 y.o. female with history of A-fib, CAD who was recently admitted to the hospital for non-ST elevation MI managed conservatively by cardiology advised as patient is not amicable to any further interventions.  Patient was discharged home on hospice presents back to the ER because of worsening shortness of breath and palpitation gradually worsening over the last few days.  Has been in some chest pressure.  Denies any nausea vomiting or diarrhea.  Patient is bedbound.  Patient also noticed right buttock boil.   ED Course: In the ER patient was in A-fib with RVR was given metoprolol 5 mg IV and also was found to be in CHF for which Lasix was given.  Chest x-ray is compatible with CHF.  BNP was around 666 high sensitive troponin is 179 and 172.  Hemoglobin is around 10.7 which is at the baseline.  Given the persistent shortness of breath and heart rate wavering between 120s and 100 admitted for further input patient management.  COVID test negative.   Assessment & Plan:   Active Problems:   CAD S/P percutaneous coronary angioplasty - PCI x 5 to SVG-D1   Renal transplant disorder   Essential hypertension   Type 2 diabetes mellitus with hyperlipidemia (HCC)   ESRD (end stage renal disease) (HCC)   Acute on chronic systolic CHF (congestive heart failure) (HCC)   Acute on chronic congestive heart failure (HCC)   Atrial fibrillation with rapid ventricular response ([place])   Acute on chronic CHF last EF measured was 55 to 60% on February fifth 2023.  Continue patient on Lasix 20 mg IV daily closely monitor intake output Daily weights. A-fib with RVR likely contributing to patient's symptoms.  Patient is on metoprolol 25 p.o. 3 times daily which will be continued.  Patient states she does takes Eliquis which was initially held for  possible surgery, I will dd/c haprin gtt and restart eliquis, phamr to assist. Possible right buttock abscess for which I ordered CT pelvis. CT reviewed, negative for abscess Diabetes mellitus type 2 with hyperglycemia last hemoglobin A1c was 7.9 2 months ago.  We will keep patient on Lantus 10 units along with sliding scale coverage. History of renal transplant on azathioprine and prednisone which will be continued. CAD status post CABG trend cardiac markers.  Restarted eliquis, d/c heparin, cont Imdur and Ranexa.No anginal sx On hospice presently but wanted to pursue treatmetn on admission, Takes Roxanol and Ativan.  Admitting consulted palliative. Patient is bedbound.  DVT prophylaxis: [id number]  Code Status: DNR    Code Status Orders  (From admission, onward)           Start     Ordered   10/18/21 2359  Do not attempt resuscitation (DNR)  Continuous       Question Answer Comment  In the event of cardiac or respiratory ARREST Do not call a code blue   In the event of cardiac or respiratory ARREST Do not perform Intubation, CPR, defibrillation or ACLS   In the event of cardiac or respiratory ARREST Use medication by any route, position, wound care, and other measures to relive pain and suffering. May use oxygen, suction and manual treatment of airway obstruction as needed for comfort.      10/19/21 0000           Code  Status History     Date Active Date Inactive Code Status Order ID Comments User Context   10/18/2021 2246 10/19/2021 0000 DNR [id number]  Katherine Patience, MD ED   10/03/2021 1226 10/05/2021 1835 DNR [id number]  Katherine Landsman, NP ED   10/03/2021 [phone] 10/03/2021 1226 DNR [id number]  Katherine Bongo, MD ED   10/03/2021 0717 10/03/2021 0852 DNR [id number]  Katherine Fuel, MD ED   09/20/2021 1520 09/26/2021 2048 DNR [id number]  Katherine Flaming, MD ED   09/02/2021 1359 09/11/2021 1943 DNR [id number]  Katherine Morton, MD Inpatient   09/02/2021 0845 09/02/2021 1251 DNR [id number]   Katherine Staggers, MD Inpatient   08/25/2021 1232 09/02/2021 0845 Full Code [id number]  Katherine [name] Inpatient   08/21/2021 1851 08/25/2021 1228 Full Code [id number]  Katherine Crome, MD Inpatient   08/04/2021 0624 08/21/2021 1851 Full Code [id number]  Katherine, Ethelda Chick, DO ED   05/12/2021 0319 05/18/2021 2014 Full Code [id number]  Katherine Sell, PA-C ED   04/19/2021 1920 04/23/2021 0016 DNR [id number]  Katherine Cordia, MD ED   03/21/2021 1950 03/23/2021 0103 Full Code [id number]  Katherine Quill, DO ED   02/19/2021 2219 02/21/2021 2256 Full Code [id number]  Katherine Manly, NP Inpatient   01/29/2021 0600 02/03/2021 2032 Full Code [id number]  Katherine Patience, MD Inpatient   07/11/2020 1108 07/14/2020 2050 Full Code [id number]  Katherine Morton, MD ED   12/25/2019 1214 12/29/2019 1549 Full Code [id number]  Katherine Morton, MD ED   08/08/2019 1626 08/13/2019 1804 Full Code [id number]  Lequita Halt, MD ED   07/23/2019 1801 07/28/2019 1729 Full Code [id number]  Katherine Margarita, [name] ED   04/21/2018 1551 04/26/2018 1804 Full Code [id number]  Baldwin Jamaica, PA-C Inpatient   04/19/2018 2320 04/21/2018 1550 Full Code [id number]  Vianne Bulls, MD Inpatient   01/28/2018 1644 02/02/2018 1657 Full Code [id number]  Katherine Deutscher, MD ED   12/03/2017 1501 12/04/2017 1647 Full Code [id number]  Katherine [name] Inpatient   10/20/2016 2212 10/21/2016 1851 Full Code [id number]  Katherine Bongo, MD Inpatient   10/14/2016 0033 10/17/2016 2023 Full Code [id number]  Katherine Patience, MD Inpatient   06/28/2014 2147 06/29/2014 2045 Full Code [id number]  Katherine Deforest, [name] Inpatient   08/23/2013 0131 08/24/2013 1506 Full Code [id number]  Katherine Quill, DO ED   06/12/2012 2308 06/17/2012 1907 Full Code [id number]  Michelene Gardener, RN Inpatient      Advance Directive Documentation    Flowsheet Row Most Recent Value  Type of Advance Directive Out of facility DNR (pink MOST or yellow form)  Pre-existing out of facility DNR order (yellow form or  pink MOST form) Yellow form placed in chart (order not valid for inpatient use)  "MOST" Form in Place? --      Family Communication: Tried calling niece, N/A  Disposition Plan: Patient will remain inpatient for continued diuresis secondary to volume overload in the setting of acute CHF as well as poor functional status being bedbound.  Patient currently is unsafe for discharge Consults called: None Admission status: Inpatient   Consultants:  PALLIATIVE  Procedures:  DG Chest 2 View  Result Date: 09/20/2021 CLINICAL DATA:  Difficulty breathing EXAM: CHEST - 2 VIEW COMPARISON:  Previous studies including the examination of 09/07/2021 FINDINGS: Transverse diameter of heart is increased. There is evidence of previous coronary bypass surgery. Central pulmonary vessels are prominent. There is poor inspiration. Increased interstitial markings are seen  in both lungs. There is minimal blunting of lateral CP angles. There is no pneumothorax. IMPRESSION: Cardiomegaly. Diffuse increase in interstitial markings in both lungs may suggest pulmonary edema or interstitial pneumonia. Part of this finding may suggest underlying scarring. Possible small bilateral pleural effusions. Electronically Signed   By: Elmer Picker M.D.   On: 09/20/2021 11:48   CT PELVIS WO CONTRAST  Result Date: 10/19/2021 CLINICAL DATA:  66 year old female with possible right buttock infection. EXAM: CT PELVIS WITHOUT CONTRAST TECHNIQUE: Multidetector CT imaging of the pelvis was performed following the standard protocol without intravenous contrast. RADIATION DOSE REDUCTION: This exam was performed according to the departmental dose-optimization program which includes automated exposure control, adjustment of the mA and/or kV according to patient size and/or use of iterative reconstruction technique. COMPARISON:  CT Abdomen and Pelvis 04/19/2021. FINDINGS: Urinary Tract: Native renal atrophy not included on today's images. Left  pelvic renal transplant without hydronephrosis. No hydroureter. Moderately distended but otherwise unremarkable urinary bladder. Bowel: No dilated large or small bowel. Some retained stool in the colon. Partially visible left abdominal gastric band catheter. Vascular/Lymphatic: Severe aortic and bilateral iliofemoral calcified atherosclerosis. Vascular patency is not evaluated in the absence of IV contrast. No lymphadenopathy identified. Reproductive:  Diminutive or absent as before. Other: No pelvic free fluid. Regressed but not completely resolved right lower abdominal wall subcutaneous inflammation or hematoma since last year, mild residual stranding on series 3, image 16. Musculoskeletal: No convincing perineum or asymmetric buttock inflammation. No soft tissue gas or fluid collection. No acute osseous abnormality identified. IMPRESSION: 1. No convincing buttock cellulitis or acute inflammatory process. No soft tissue gas or fluid collection. 2. Severe Aortic Atherosclerosis (ICD10-I70.0), and iliofemoral atherosclerosis. Cannot exclude iliac or femoral artery occlusion due to plaque. 3. Left pelvic renal transplant. Electronically Signed   By: Genevie Ann M.D.   On: 10/19/2021 07:47   DG Chest Portable 1 View  Result Date: 10/18/2021 CLINICAL DATA:  Worsening shortness of breath today. EXAM: PORTABLE CHEST 1 VIEW COMPARISON:  Radiograph 10/03/2021, CT 08/05/2021 FINDINGS: Significant patient rotation. Post median sternotomy and CABG. Cardiomegaly stable. There are persistent bilateral heterogeneous pulmonary opacities with slight improvement from prior exam. No new or acute airspace disease. No pleural effusion or pneumothorax. No acute osseous findings. IMPRESSION: 1. Persistent bilateral heterogeneous pulmonary opacities with slight improvement from prior exam. This may represent multifocal pneumonia, scarring, or pulmonary edema. 2. Stable cardiomegaly. Electronically Signed   By: Keith Rake M.D.   On:  10/18/2021 18:24   DG Chest Port 1 View  Result Date: 10/03/2021 CLINICAL DATA:  Short of breath. EXAM: PORTABLE CHEST 1 VIEW COMPARISON:  09/23/2021 FINDINGS: Previous median sternotomy and CABG procedure. Stable cardiomediastinal contours. Interval increase in bilateral interstitial and airspace opacities. No acute osseous findings. IMPRESSION: Worsening aeration to the lungs bilaterally compatible with progressive pulmonary edema versus multifocal infection. Electronically Signed   By: Kerby Moors M.D.   On: 10/03/2021 05:52   DG CHEST PORT 1 VIEW  Result Date: 09/23/2021 CLINICAL DATA:  Shortness of breath EXAM: PORTABLE CHEST 1 VIEW COMPARISON:  Previous studies including the examination of 09/20/2021 FINDINGS: Transverse diameter of heart is increased. There is interval decrease in pulmonary vascular congestion. There is interval decrease in interstitial markings in both lungs and improvement in aeration of both lungs. There is residual prominence of interstitial markings in the right parahilar region and both lower lung fields. There is no focal pulmonary consolidation. There is previous coronary bypass surgery. There is blunting  of both lateral CP angles suggesting small effusions. There is no pneumothorax. IMPRESSION: Cardiomegaly. There is interval decrease in pulmonary vascular congestion and pulmonary edema. There is residual prominence of interstitial markings in the right parahilar region and both lower lung fields suggesting residual pulmonary edema or interstitial pneumonia. No new focal infiltrates are seen. Electronically Signed   By: Elmer Picker M.D.   On: 09/23/2021 13:13   ECHOCARDIOGRAM LIMITED  Result Date: 09/22/2021    ECHOCARDIOGRAM LIMITED REPORT   Patient Name:   SHIFA BRISBON Date of Exam: 09/21/2021 Medical Rec #:  [id number]          Height:       61.0 in Accession #:    [id number]         Weight:       204.1 lb Date of Birth:  1956-02-06          BSA:           1.905 m Patient Age:    24 years           BP:           113/62 mmHg Patient Gender: F                  HR:           92 bpm. Exam Location:  Inpatient Procedure: Limited Echo and Intracardiac Opacification Agent Indications:    CAD  History:        Patient has prior history of Echocardiogram examinations, most                 recent 08/09/2021. CAD and Previous Myocardial Infarction, Prior                 CABG; Risk Factors:Hypertension, Diabetes and Dyslipidemia.                 Kidney transplant.  Sonographer:    Merrie Roof RDCS Referring Phys: [id number] [place]  1. Apical psuedoaneuyrsm post MI. Left ventricular ejection fraction, by estimation, is 55 to 60%. The left ventricle has normal function. The left ventricle demonstrates regional wall motion abnormalities (see scoring diagram/findings for description). FINDINGS  Left Ventricle: Apical psuedoaneuyrsm post MI. Left ventricular ejection fraction, by estimation, is 55 to 60%. The left ventricle has normal function. The left ventricle demonstrates regional wall motion abnormalities. Definity contrast agent was given  IV to delineate the left ventricular endocardial borders.  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 Candee Furbish MD Electronically signed by Candee Furbish MD Signature Date/Time: 09/22/2021/11:18:36 AM    Final     Antimicrobials:  CEFEPIME AND VANC    Subjective: Pt is tired and reports feeling poorly No acute changes from admission earlier in day  Objective: Vitals:   10/19/21 0458 10/19/21 0754 10/19/21 0824 10/19/21 1051  BP: 110/67 (!) 115/99  118/84  Pulse: (!) 50 88  (!) 110  Resp: '18 20  20  '$ Temp: 98.3 F (36.8 C) 98.1 F (36.7 C)    TempSrc: Oral Oral  Oral  SpO2: 93%  92% 93%  Weight:      Height:        Intake/Output Summary (Last 24 hours) at 10/19/2021 1243 Last data filed at 10/19/2021 1052 Gross per 24 hour  Intake 688.96 ml  Output 1050 ml  Net -361.04 ml   Filed  Weights   10/18/21 1741  Weight: 86.2 kg  Examination:  Eyes: Anicteric no pallor. ENMT: No discharge from the ears eyes nose and mouth. Neck: No mass felt.  No neck rigidity. Respiratory: No rhonchi or crepitations. Cardiovascular: S1-S2 heard. Abdomen: Soft nontender bowel sound present. Musculoskeletal: Mild edema. Skin: On the buttock on the right side there is around 2 cm lesion unstageable, I could not appreciate fluctuance or abscess Neurologic: Alert awake oriented time place and person.  Psychiatric: Appears normal.  flat affect.      Data Reviewed: I have personally reviewed following labs and imaging studies  CBC: Recent Labs  Lab 10/18/21 1802 10/19/21 0217  WBC 6.2 6.7  NEUTROABS 4.3  --   HGB 10.7* 10.8*  HCT 34.8* 36.0  MCV 85.7 84.9  PLT 330 992*   Basic Metabolic Panel: Recent Labs  Lab 10/18/21 1802 10/19/21 0217  NA 134* 131*  K 4.7 3.8  CL 92* 90*  CO2 27 28  GLUCOSE 303* 316*  BUN 10 9  CREATININE 0.88 0.91  CALCIUM 9.2 8.8*   GFR: Estimated Creatinine Clearance: 62.3 mL/min (by C-G formula based on SCr of 0.91 mg/dL). Liver Function Tests: Recent Labs  Lab 10/18/21 1802  AST 57*  ALT 23  ALKPHOS 124  BILITOT 1.5*  PROT 6.9  ALBUMIN 2.9*   No results for input(s): LIPASE, AMYLASE in the last 168 hours. No results for input(s): AMMONIA in the last 168 hours. Coagulation Profile: No results for input(s): INR, PROTIME in the last 168 hours. Cardiac Enzymes: No results for input(s): CKTOTAL, CKMB, CKMBINDEX, TROPONINI in the last 168 hours. BNP (last 3 results) No results for input(s): PROBNP in the last 8760 hours. HbA1C: No results for input(s): HGBA1C in the last 72 hours. CBG: Recent Labs  Lab 10/19/21 0812 10/19/21 1050  GLUCAP 287* 363*   Lipid Profile: No results for input(s): CHOL, HDL, LDLCALC, TRIG, CHOLHDL, LDLDIRECT in the last 72 hours. Thyroid Function Tests: Recent Labs    10/19/21 0217  TSH 1.872    Anemia Panel: No results for input(s): VITAMINB12, FOLATE, FERRITIN, TIBC, IRON, RETICCTPCT in the last 72 hours. Sepsis Labs: No results for input(s): PROCALCITON, LATICACIDVEN in the last 168 hours.  Recent Results (from the past 240 hour(s))  Resp Panel by RT-PCR (Flu A&B, Covid) Nasopharyngeal Swab     Status: None   Collection Time: 10/18/21  9:09 PM   Specimen: Nasopharyngeal Swab; Nasopharyngeal(NP) swabs in vial transport medium  Result Value Ref Range Status   SARS Coronavirus 2 by RT PCR NEGATIVE NEGATIVE Final    Comment: (NOTE) SARS-CoV-2 target nucleic acids are NOT DETECTED.  The SARS-CoV-2 RNA is generally detectable in upper respiratory specimens during the acute phase of infection. The lowest concentration of SARS-CoV-2 viral copies this assay can detect is 138 copies/mL. A negative result does not preclude SARS-Cov-2 infection and should not be used as the sole basis for treatment or other patient management decisions. A negative result may occur with  improper specimen collection/handling, submission of specimen other than nasopharyngeal swab, presence of viral mutation(s) within the areas targeted by this assay, and inadequate number of viral copies(<138 copies/mL). A negative result must be combined with clinical observations, patient history, and epidemiological information. The expected result is Negative.  Fact Sheet for Patients:  EntrepreneurPulse.com.au  Fact Sheet for Healthcare Providers:  IncredibleEmployment.be  This test is no t yet approved or cleared by the [place] FDA and  has been authorized for detection and/or diagnosis of SARS-CoV-2 by FDA under an Emergency  Use Authorization (EUA). This EUA will remain  in effect (meaning this test can be used) for the duration of the COVID-19 declaration under Section 564(b)(1) of the Act, 21 U.S.C.section 360bbb-3(b)(1), unless the authorization is terminated   or revoked sooner.       Influenza A by PCR NEGATIVE NEGATIVE Final   Influenza B by PCR NEGATIVE NEGATIVE Final    Comment: (NOTE) The Xpert Xpress SARS-CoV-2/FLU/RSV plus assay is intended as an aid in the diagnosis of influenza from Nasopharyngeal swab specimens and should not be used as a sole basis for treatment. Nasal washings and aspirates are unacceptable for Xpert Xpress SARS-CoV-2/FLU/RSV testing.  Fact Sheet for Patients: EntrepreneurPulse.com.au  Fact Sheet for Healthcare Providers: IncredibleEmployment.be  This test is not yet approved or cleared by the [place] FDA and has been authorized for detection and/or diagnosis of SARS-CoV-2 by FDA under an Emergency Use Authorization (EUA). This EUA will remain in effect (meaning this test can be used) for the duration of the COVID-19 declaration under Section 564(b)(1) of the Act, 21 U.S.C. section 360bbb-3(b)(1), unless the authorization is terminated or revoked.  Performed at [facility], [place] [address] [place], [place] [address]          Radiology Studies: CT PELVIS WO CONTRAST  Result Date: 10/19/2021 CLINICAL DATA:  66 year old female with possible right buttock infection. EXAM: CT PELVIS WITHOUT CONTRAST TECHNIQUE: Multidetector CT imaging of the pelvis was performed following the standard protocol without intravenous contrast. RADIATION DOSE REDUCTION: This exam was performed according to the departmental dose-optimization program which includes automated exposure control, adjustment of the mA and/or kV according to patient size and/or use of iterative reconstruction technique. COMPARISON:  CT Abdomen and Pelvis 04/19/2021. FINDINGS: Urinary Tract: Native renal atrophy not included on today's images. Left pelvic renal transplant without hydronephrosis. No hydroureter. Moderately distended but otherwise unremarkable urinary bladder. Bowel: No dilated large or small  bowel. Some retained stool in the colon. Partially visible left abdominal gastric band catheter. Vascular/Lymphatic: Severe aortic and bilateral iliofemoral calcified atherosclerosis. Vascular patency is not evaluated in the absence of IV contrast. No lymphadenopathy identified. Reproductive:  Diminutive or absent as before. Other: No pelvic free fluid. Regressed but not completely resolved right lower abdominal wall subcutaneous inflammation or hematoma since last year, mild residual stranding on series 3, image 16. Musculoskeletal: No convincing perineum or asymmetric buttock inflammation. No soft tissue gas or fluid collection. No acute osseous abnormality identified. IMPRESSION: 1. No convincing buttock cellulitis or acute inflammatory process. No soft tissue gas or fluid collection. 2. Severe Aortic Atherosclerosis (ICD10-I70.0), and iliofemoral atherosclerosis. Cannot exclude iliac or femoral artery occlusion due to plaque. 3. Left pelvic renal transplant. Electronically Signed   By: Genevie Ann M.D.   On: 10/19/2021 07:47   DG Chest Portable 1 View  Result Date: 10/18/2021 CLINICAL DATA:  Worsening shortness of breath today. EXAM: PORTABLE CHEST 1 VIEW COMPARISON:  Radiograph 10/03/2021, CT 08/05/2021 FINDINGS: Significant patient rotation. Post median sternotomy and CABG. Cardiomegaly stable. There are persistent bilateral heterogeneous pulmonary opacities with slight improvement from prior exam. No new or acute airspace disease. No pleural effusion or pneumothorax. No acute osseous findings. IMPRESSION: 1. Persistent bilateral heterogeneous pulmonary opacities with slight improvement from prior exam. This may represent multifocal pneumonia, scarring, or pulmonary edema. 2. Stable cardiomegaly. Electronically Signed   By: Keith Rake M.D.   On: 10/18/2021 18:24        Scheduled Meds:  arformoterol  15 mcg Nebulization BID  azaTHIOprine  125 mg Oral Daily   clopidogrel  75 mg Oral Daily    docusate sodium  100 mg Oral BID   furosemide  20 mg Intravenous Daily   gabapentin  100 mg Oral QHS   insulin glargine-yfgn  10 Units Subcutaneous Daily   isosorbide mononitrate  60 mg Oral Daily   lamoTRIgine  25 mg Oral QHS   metoprolol tartrate  25 mg Oral TID   montelukast  10 mg Oral QHS   ondansetron  4 mg Oral Q8H   pantoprazole  40 mg Oral Daily   predniSONE  5 mg Oral Q breakfast   ranolazine  500 mg Oral BID   sertraline  100 mg Oral Daily   Continuous Infusions:  ceFEPime (MAXIPIME) IV 2 g (10/19/21 0821)   heparin 1,000 Units/hr (10/19/21 0308)   [START ON 10/20/2021] vancomycin       LOS: 1 day    Time spent: 74 min    Nicolette Bang, MD [facility]  If 7PM-7AM, please contact night-coverage  10/19/2021, 12:43 PM

## 2021-10-19 NOTE — Progress Notes (Addendum)
Pharmacy Antibiotic Note ? ?Katherine [name] is a 66 y.o. female admitted on 10/18/2021 with cellulitis.  Pharmacy has been consulted for vancomycin and cefepime dosing. ? ?Plan: ?Vancomycin 1750 mg IV loading dose x1. Vancomycin 1000 mg IV every 24 hours. Goal AUC 400-550. ?Expected AUC: 520 ?SCr used: 0.88 ?Check vanc levels around 4th dose ? ?Cefepime 2 g IV q8h ? ?Height: '5\' 1"'$  (154.9 cm) ?Weight: 86.2 kg (190 lb) ?IBW/kg (Calculated) : 47.8 ? ?Temp (24hrs), Avg:98.8 ?F (37.1 ?C), Min:97.9 ?F (36.6 ?C), Max:99.6 ?F (37.6 ?C) ? ?Recent Labs  ?Lab 10/18/21 ?1802  ?WBC 6.2  ?CREATININE 0.88  ?  ?Estimated Creatinine Clearance: 64.4 mL/min (by C-G formula based on SCr of 0.88 mg/dL).   ? ?Allergies  ?Allergen Reactions  ? Tetracycline Hives  ?  Patient tolerated Doxycycline Dec 2020  ? Niacin Other (See Comments)  ?  Mouth blisters  ? Niaspan [Niacin Er] Other (See Comments)  ?  Mouth blisters  ? Sulfa Antibiotics Nausea Only and Other (See Comments)  ?  "Tears up stomach"  ? Sulfonamide Derivatives Other (See Comments)  ?  Reaction: per patient "tears her stomach up"  ? Codeine Nausea And Vomiting  ? Erythromycin Nausea And Vomiting  ? Hydromorphone Hcl Nausea And Vomiting  ? Morphine And Related Nausea And Vomiting  ? Nalbuphine Nausea And Vomiting  ?  Nubain  ? Sulfasalazine Nausea Only and Other (See Comments)  ?  per patient "tears her stomach up", "Tears up stomach"  ? Tape Rash and Other (See Comments)  ?  No "plastic" tape," please----cloth tape only  ? ? ?Antimicrobials this admission: ?Vancomycin 3/5 >> ?Cefepime 3/5 >> ? ?Thank you for allowing pharmacy to be a part of this patient?s care. ? ?Alene Mires Katherine [name] ?10/19/2021 1:26 AM ? ?

## 2021-10-19 NOTE — Plan of Care (Signed)

## 2021-10-19 NOTE — Consult Note (Signed)
? ?                                                                                ?Consultation Note ?Date: 10/19/2021  ? ?Patient Name: Katherine [name]  ?DOB: 1956/08/15  MRN: [id number]  Age / Sex: 66 y.o., female  ?PCP: [name], MD ?Referring Physician: Marcell Anger* ? ?Reason for Consultation: Establishing goals of care ? ?HPI/Patient Profile: 66 y.o. 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 ? ?This is patient's 8th admission in the last 6 months. At her previous admission, patient decided to shift to full comfort measures and discharged home on 2/19 with [facility] support.  ? ?Clinical Assessment and Goals of Care: ?I have reviewed medical records including EPIC notes, labs and imaging, assessed the patient and then met with patient at bedside  to discuss diagnosis prognosis, GOC, EOL wishes, disposition and options. ? ?I introduced Palliative Medicine as specialized medical care for people living with serious illness. It focuses on providing relief from the symptoms and stress of a serious illness. The goal is to improve quality of life for both the patient and the family. I am familiar with patient as we met during her last hospitalization. Thus, no brief life review needed.  ? ?We discussed patient's current illness and what it means in the larger context of patient's on-going co-morbidities.  Pt states she was doing fine at home until hospice changed her anxiety medication. She says she is taking olanzepine Q6H but feels like it is not managing her anxiety and worries as  well as the ativan she has taken for years.  ? ?I attempted to elicit values and goals of care important to the patient.  Pt continues to want to focus her treatment plan on comfort and addressing symptoms such as pain and anxiety. She continues to want to avoid aggressive medical interventions. DNR remains.  ? ?Discussed with patient/family the importance of continued conversation with family and the medical providers regarding overall plan of care and treatment options, ensuring decisions are within the context of the patient?s values and GOCs.  Pt wishes to name niece [name] at [place]. She shares she is in and out of the hospital too often to get the paperwork completed at home. Discussed that spiritual care in hospital can help complete ACP documents. Spiritual care consult placed for ACP documentation.  ? ?Questions and concerns were addressed. The family was encouraged to call with questions or concerns.  ? ?Primary Decision Maker ?PATIENT ? ?Code Status/Advance Care Planning: ?DNR ? ?Prognosis:   ?< 6 months ? ?Discharge Planning: Home with Hospice ? ?Primary Diagnoses: ?Present on Admission: ? ESRD (end stage renal disease) ([place]) ? Essential hypertension ? Type 2 diabetes mellitus with hyperlipidemia ([place]) ? Acute on chronic systolic CHF (congestive heart failure) ([place]) ? Renal transplant disorder ? ? ?  Physical Exam ?Vitals and nursing note reviewed.  ?Constitutional:   ?   General: She is not in acute distress. ?   Appearance: She is not toxic-appearing.  ?HENT:  ?   Head: Normocephalic and atraumatic.  ?Cardiovascular:  ?   Rate and Rhythm: Tachycardia present.  ?Pulmonary:  ?   Effort: Pulmonary effort is normal.  ?   Comments: [place] in place ?Abdominal:  ?   Palpations: Abdomen is soft.  ?Musculoskeletal:  ?   Comments: Generalized weakness  ?Neurological:  ?   Mental Status: She is alert and oriented to person, place, and time.  ?Psychiatric:     ?   Mood and Affect: Mood normal. Mood is not anxious.     ?    Behavior: Behavior normal. Behavior is not agitated.  ? ? ?Palliative Assessment/Data: 40% ? ? ? ? ?I discussed this patient's plan of care with patient. ? ?Thank you for this consult. Palliative medicine will continue to follow and assist holistically.  ? ?Time Total: 75 minutes ?Greater than 50%  of this time was spent counseling and coordinating care related to the above assessment and plan. ? ?Signed by: ?Jordan Hawks, DNP, FNP-BC ?Palliative Medicine ? ?  ?Please contact Palliative Medicine Team phone at [phone] for questions and concerns.  ?For individual provider: See Amion ? ? ? ? ? ? ? ? ? ? ? ? ?  ?

## 2021-10-19 NOTE — Consult Note (Signed)
WOC Nurse Consult Note: ?Reason for Consult: Right buttock (ischial tuberosity) Unstageable pressure injury ?Wound type:Pressure plus moisture ?Pressure Injury POA: Yes ?Measurement: 2.5cm round with depth unable to be determined due to the presence of fibrinous yellow slough ?Wound bed:100% obscured by the presence of yellow fibrinous slough dissolving via autolysis ?Drainage (amount, consistency, odor) small ?Periwound:intact, macerated ?Dressing procedure/placement/frequency: I have provided Nursing with guidance for the care of this wound using a once daily application of xeroform guaze topped with silicone foam to continue autolytic debridement. This will also be appropriate after dissolution of the fibrinous wound cover to enhance tissue repair and regeneration. The POC will be dependent on the ability to offload the area and reduce moisture.  ?I will provide bilateral pressure redistribution heel boots and a pressure redistribution chair pad for her use when OOB in chair. ?Discussed POC wit Bedside RN. ? ?[place] nursing team will not follow, but will remain available to this patient, the nursing and medical teams.  Please re-consult if needed. ?Thanks, ?Maudie Flakes, MSN, RN, [place], [place], CWON-AP, [place]  ?Pager# [phone]  ? ? ?  ?

## 2021-10-19 NOTE — H&P (Signed)
History and Physical    Diahn [name] QQP:[id number] DOB: Aug 27, 1955 DOA: 10/18/2021  PCP: [name], MD  Patient coming from: Home.  Chief Complaint: Shortness of breath and palpitation.  HPI: Katherine [name] is a 66 y.o. female with history of A-fib, CAD who was recently admitted to the hospital for non-ST elevation MI managed conservatively by cardiology advised as patient is not amicable to any further interventions.  Patient was discharged home on hospice presents back to the ER because of worsening shortness of breath and palpitation gradually worsening over the last few days.  Has been in some chest pressure.  Denies any nausea vomiting or diarrhea.  Patient is bedbound.  Patient also noticed right buttock boil.  ED Course: In the ER patient was in A-fib with RVR was given metoprolol 5 mg IV and also was found to be in CHF for which Lasix was given.  Chest x-ray is compatible with CHF.  BNP was around 666 high sensitive troponin is 179 and 172.  Hemoglobin is around 10.7 which is at the baseline.  Given the persistent shortness of breath and heart rate wavering between 120s and 100 admitted for further input patient management.  COVID test negative.  Review of Systems: As per HPI, rest all negative.   Past Medical History:  Diagnosis Date   Anemia    Anxiety    Bilateral carotid artery stenosis    Carotid duplex 02/1244: 8-09% LICA, 98-33% RICA, >82% RECA, f/u 1 yr suggested   CAD (coronary artery disease) of bypass graft 5/01; 3/'02, 8/'03, 10/'04; 1/15   PCI x 5 to SVG-D1    CAD in native artery 07/1993   3 Vessel Disease (LAD-D1 & RCA) -- CABG (Dx in setting of inferior STEMI-PTCA of RCA)   COPD mixed type ([place])    Followed by Dr. Lamonte Sakai "pulmonologist said no COPD"   Depression with anxiety    Diabetes mellitus type 2 in obese ([place])    Diarrhea    started after cholecystectomy and mass removed from intestine   Dyslipidemia, goal LDL below 70    08/2012: TC 137,  TG 200, HDL 32!, LDL 45; on statin (followed by Dr.Deterding)   ESRD (end stage renal disease) ([place]) 1991   s/p Cadaveric Renal Transplant [facility] - Dr. 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  Had having having lots of nausea and vomiting with poor p.o. intake.  Thought to have QT prolongation with multiple medications involved and hypomagnesemia, hypokalemia.  Tikosyn discontinued along with Zoloft and Phenergan.   Hypertension associated with diabetes ([place])    Mild aortic stenosis by prior echocardiogram 07/2019   Echo:  Mild aortic stenosis (gradients: Mean 14.3 mmHg -peak 24.9 mmHg).   Morbid obesity (H[facility])    MRSA (methicillin resistant staph aureus) culture positive    OSA (obstructive sleep apnea)    no longer on CPAP or home O2, states she doesn't need now after lap band   PAD (peripheral artery disease) ([place]) 08/2013   LEA Dopplers to be read by Dr. Fletcher Anon   PAF (paroxysmal atrial fibrillation) ([place]) 06/2014  Noted on CardioNet Monitor  - --> rhythm control with Tikosyn (Dr. Rayann Heman); converted from warfarin to apixaban for anticoagulation.   Pneumonia    Recurrent boils    Bilateral Groin   Rheumatoid arthritis ([place])    Per Patient Report; associated with OA   S/p cadaver renal transplant 1991   [facility]    Past Surgical History:  Procedure Laterality Date   ABDOMINAL AORTOGRAM N/A 04/21/2018   Procedure: ABDOMINAL AORTOGRAM;  Surgeon: [name], MD;  Location: [place] [facility];  Service: Cardiovascular;  Laterality: N/A;   CATHETER REMOVAL     CHOLECYSTECTOMY N/A 10/29/2014   Procedure: LAPAROSCOPIC CHOLECYSTECTOMY WITH INTRAOPERATIVE CHOLANGIOGRAM;   Surgeon: Excell Seltzer, MD;  Location: [facility];  Service: General;  Laterality: N/A;   CORONARY ANGIOPLASTY  1994   x5   CORONARY ARTERY BYPASS GRAFT  1995   LIMA-LAD, SVG-RPDA, SVG-D1   CORONARY STENT INTERVENTION N/A 02/20/2021   Procedure: PERCUTANEOUS CORONARY STENT INTERVENTION;  Surgeon: [name], MD;  Location: [place] [facility]; ostLCx 60% (Neg RFR 0.96);; SVG- D2 recurrent 90% ISR & 95% native D2 after graft-> DES PCI of 95% anastomotic D2 lesion (Onyx Frontier 2.25 mm x 12 mm => 2.75 mm @ overlap, 2.5 distal.);PTCA of ISR in body of graft. ->  2.5 mm scoring balloon & post-dil w/ 2.75 mm [place] balloon   ESOPHAGOGASTRODUODENOSCOPY N/A 10/15/2016   Procedure: ESOPHAGOGASTRODUODENOSCOPY (EGD);  Surgeon: Wilford Corner, MD;  Location: [facility] ENDOSCOPY;  Service: Endoscopy;  Laterality: N/A;   I & D EXTREMITY Right 01/29/2018   Procedure: IRRIGATION AND DEBRIDEMENT THUMB;  Surgeon: Dayna Barker, MD;  Location: [place];  Service: Plastics;  Laterality: Right;   INCISE AND DRAIN ABCESS     INTRAVASCULAR PRESSURE WIRE/FFR STUDY N/A 02/20/2021   Procedure: INTRAVASCULAR PRESSURE WIRE/FFR STUDY;  Surgeon: [name], MD;  Location: [place] [facility];  Service: Cardiovascular;  Laterality: N/A;   KIDNEY TRANSPLANT  1991   KNEE ARTHROSCOPY WITH LATERAL MENISECTOMY Left 12/03/2017   Procedure: LEFT KNEE ARTHROSCOPY WITH LATERAL MENISECTOMY;  Surgeon: Earlie Server, MD;  Location: [place];  Service: Orthopedics;  Laterality: Left;   LAPAROSCOPIC GASTRIC BANDING  04/2004; 10/'09, 2/'10   Port Replacement x 2   LEFT HEART CATH AND CORONARY ANGIOGRAPHY N/A 02/20/2021   Procedure: LEFT HEART CATH AND CORONARY ANGIOGRAPHY;  Surgeon: [name], MD;  Location: [place] [facility];  Service: Cardiovascular;  Laterality: N/A;   LEFT HEART CATH AND CORS/GRAFTS ANGIOGRAPHY N/A 04/21/2018   Procedure: LEFT HEART CATH AND CORS/GRAFTS ANGIOGRAPHY;  Surgeon: [name], MD;  Location: [place] [facility];  Ost-Prox LAD 50% - proxLAD (pre & post D1) 100% CTO. Cx - patent, small OM1 (stable ~ ostial OM1 90%, too small for PCI) & 2 small LPL; Ost-distal RCA 100% CTO.  LIMA-LAD (not injected); SVG-dRCA patent, SVG-D1 - insertion stent ~20% ISR - Severe R CFA disease w/ focal Sub TO   LEFT HEART CATH AND CORS/GRAFTS ANGIOGRAPHY  5/'01, 3/'02, 8/'03, 10/'04; 1/'15   08/22/2013: LAD & RCA 100%; LIMA-LAD & SVG-rPDA patent; Cx-- OM1 60%, OM2 ostial ~50%; SVG-D1 - 80% mid, 50% distal ISR --PCI   LEFT HEART CATH AND CORS/GRAFTS ANGIOGRAPHY N/A 01/31/2021   Procedure: LEFT HEART CATH AND CORS/GRAFTS ANGIOGRAPHY;  Surgeon: Jolaine Artist, MD;  Location: [place] [facility];;   LEFT HEART CATH AND CORS/GRAFTS ANGIOGRAPHY N/A 09/05/2021   Procedure: LEFT HEART CATH AND CORS/GRAFTS ANGIOGRAPHY;  Surgeon:  [name], MD;  Location: [place] [facility];  Service: Cardiovascular;  Laterality: N/A;   LEFT HEART CATH AND CORS/GRAFTS ANGIOGRAPHY N/A 08/21/2021   Procedure: LEFT HEART CATH AND CORS/GRAFTS ANGIOGRAPHY;  Surgeon: Belva Crome, MD;  Location: [place] [facility];  Service: Cardiovascular;  Laterality: N/A;   LEFT HEART CATHETERIZATION WITH CORONARY/GRAFT ANGIOGRAM N/A 08/23/2013   Procedure: LEFT HEART CATHETERIZATION WITH Beatrix Fetters;  Surgeon: Wellington Hampshire, MD;  Location: [place] C[facility] LAB;  Service: Cardiovascular;  Laterality: N/A;   Lower Extremity Arterial Dopplers  08/2013   ABI: R 0.96, L 1.04   MULTIPLE TOOTH EXTRACTIONS  age 22   NM MYOVIEW LTD  03/2016   EF 62%. LOW RISK. C/W prior MI - no Ischemia. Apical hypokinesis.   PERCUTANEOUS CORONARY STENT INTERVENTION (PCI-S)  5/'01, 3/'02, 8/'03, 10/'04;   '01 - S660 BMS 2.5 x 9 - dSVG-D1 into D1; '02- post-stent stenosis - 2.5 x 8 Pixel BMS; '8\03: ISR/Thrombosis into native D1 - AngioJet, 2.5 x 13 Pixel; '04 - ISR 95% - covered stented area with Taxus DES 2.5 mm x 20 (2.88)   PERCUTANEOUS CORONARY STENT INTERVENTION  (PCI-S)  08/23/2013   Procedure: PERCUTANEOUS CORONARY STENT INTERVENTION (PCI-S);  Surgeon: Wellington Hampshire, MD;  Location: [facility] CATH LAB;  Service: Cardiovascular;;mid SVG-D1 80%; distal stent ~50% ISR; Promus Prermier DES 2.75 mm xc 20 mm (2.8 mm)   PORT-A-CATH REMOVAL     kidney   TRANSTHORACIC ECHOCARDIOGRAM  07/2019   a) 07/2019: EF 55 to 60%.  No LVH.  Paradoxical septal WM-s/p CABG.  GRII DD.  Nl RV size and fxn.  Mild bilateral atrial dilation.  Mod MAC.  Trace MR.  Mild AS (gradients: Mean 14.3 mmHg -peak 24.9 mmHg).; B) 06/2020: EF 40 to 45%.  Moderate concentric LVH.  GRII DD.  Elevated LAP.  Mod HK mid Apical Ant-AntSept wall & mild Apical Dyskinesis.  Mod LA dilation.  Mild MR.  AoV sclerosis w/o AS.   TRANSTHORACIC ECHOCARDIOGRAM  01/30/2021   EF 55 to 60%.  Mild LVH.  GR 1 DD.  Elevated LAP.  Moderate LA dilation.  Mild MR with mild MS.  Mild aortic valve stenosis.:   TUBAL LIGATION     wrist fistula repair Left    dialysis for one year     reports that she has been smoking cigarettes. She has a 30.00 pack-year smoking history. She has never used smokeless tobacco. She reports that she does not drink alcohol and does not use drugs.  Allergies  Allergen Reactions   Tetracycline Hives    Patient tolerated Doxycycline Dec 2020   Niacin Other (See Comments)    Mouth blisters   Niaspan [Niacin Er] Other (See Comments)    Mouth blisters   Sulfa Antibiotics Nausea Only and Other (See Comments)    "Tears up stomach"   Sulfonamide Derivatives Other (See Comments)    Reaction: per patient "tears her stomach up"   Codeine Nausea And Vomiting   Erythromycin Nausea And Vomiting   Hydromorphone Hcl Nausea And Vomiting   Morphine And Related Nausea And Vomiting   Nalbuphine Nausea And Vomiting    Nubain   Sulfasalazine Nausea Only and Other (See Comments)    per patient "tears her stomach up", "Tears up stomach"   Tape Rash and Other (See Comments)    No "plastic" tape,"  please----cloth tape only    Family History  Problem Relation Age of Onset   Cancer Mother  liver   Heart disease Father    Cancer Father        colon   Arrhythmia Brother        Atrial Fibrillation   Arrhythmia Paternal Aunt        Atrial Fibrillation    Prior to Admission medications   Medication Sig Start Date End Date Taking? Authorizing Provider  albuterol (VENTOLIN HFA) 108 (90 Base) MCG/ACT inhaler TAKE 2 PUFFS BY MOUTH EVERY 6 HOURS AS NEEDED FOR WHEEZE OR SHORTNESS OF BREATH Patient taking differently: 2 puffs every 6 (six) hours as needed for wheezing or shortness of breath. 02/10/21   Byrum, Rose Fillers, MD  arformoterol (BROVANA) 15 MCG/2ML NEBU Take 2 mLs (15 mcg total) by nebulization 2 (two) times daily. 08/30/19   Collene Gobble, MD  azaTHIOprine (IMURAN) 50 MG tablet Take 125 mg by mouth See admin instructions. Take 2 1/2 tablets (125 mg) by mouth daily at 3pm    [provider]  bisacodyl (DULCOLAX) 5 MG EC tablet Take 1 tablet (5 mg total) by mouth daily as needed for moderate constipation. 10/05/21   Allie Bossier, MD  budesonide (PULMICORT) 0.5 MG/2ML nebulizer solution Take 2 mLs (0.5 mg total) by nebulization 2 (two) times daily. 08/24/19   [name], MD  cetirizine (ZYRTEC) 5 MG tablet Take 5 mg by mouth daily.    [provider]  cyclobenzaprine (FLEXERIL) 10 MG tablet Take 1 tablet (10 mg total) by mouth 3 (three) times daily as needed for muscle spasms. 08/25/21   Kathie Dike, MD  docusate sodium (COLACE) 100 MG capsule Take 1 capsule (100 mg total) by mouth 2 (two) times daily. 10/05/21   Allie Bossier, MD  fluticasone (FLONASE) 50 MCG/ACT nasal spray PLACE 2 SPRAYS INTO BOTH NOSTRILS 2 TIMES DAILY. Patient taking differently: 2 sprays in the morning and at bedtime. 06/27/21   Collene Gobble, MD  gabapentin (NEURONTIN) 100 MG capsule Take 1 capsule (100 mg total) by mouth at bedtime. 09/11/21   Hosie Poisson, MD  isosorbide  mononitrate (IMDUR) 60 MG 24 hr tablet Take 1 tablet (60 mg total) by mouth daily. 09/27/21 10/27/21  Arrien, Jimmy Picket, MD  lamoTRIgine (LAMICTAL) 25 MG tablet TAKE 1 TABLET BY MOUTH EVERYDAY AT BEDTIME Patient taking differently: Take 25 mg by mouth at bedtime. 07/25/21   Meredith Staggers, MD  LORazepam (ATIVAN) 2 MG/ML concentrated solution Take 0.5 mLs (1 mg total) by mouth 3 (three) times daily. 10/05/21   Allie Bossier, MD  LORazepam (ATIVAN) 2 MG/ML concentrated solution Place 0.5 mLs (1 mg total) under the tongue every hour as needed for anxiety, seizure, sedation or sleep (distress). 10/05/21   Allie Bossier, MD  metoprolol tartrate (LOPRESSOR) 25 MG tablet Take 1 tablet (25 mg total) by mouth 3 (three) times daily. 09/11/21   Hosie Poisson, MD  montelukast (SINGULAIR) 10 MG tablet TAKE 1 TABLET BY MOUTH EVERYDAY AT BEDTIME Patient taking differently: Take 10 mg by mouth at bedtime. 09/23/21   Collene Gobble, MD  Morphine Sulfate (MORPHINE CONCENTRATE) 10 MG/0.5ML SOLN concentrated solution Take 0.5 mLs (10 mg total) by mouth every 6 (six) hours. 10/05/21   Allie Bossier, MD  Morphine Sulfate (MORPHINE CONCENTRATE) 10 MG/0.5ML SOLN concentrated solution Take 0.25-0.5 mLs (5-10 mg total) by mouth every 2 (two) hours as needed for severe pain, anxiety or shortness of breath (distress). 10/05/21   Allie Bossier, MD  OLANZapine (ZYPREXA) 5 MG tablet  Take 5 mg by mouth at bedtime. 10/02/21   [provider]  ondansetron (ZOFRAN-ODT) 4 MG disintegrating tablet Take 1 tablet (4 mg total) by mouth every 8 (eight) hours. 10/05/21   Allie Bossier, MD  oxyCODONE (OXY IR/ROXICODONE) 5 MG immediate release tablet Take 5 mg by mouth 4 (four) times daily as needed for moderate pain. 09/29/21   [provider]  OXYGEN Inhale 4 L into the lungs continuous.    [provider]  pantoprazole (PROTONIX) 40 MG tablet TAKE 1 TABLET BY MOUTH EVERY DAY 10/14/21   Meredith Staggers, MD   polyethylene glycol (MIRALAX / GLYCOLAX) 17 g packet Take 17 g by mouth daily as needed for mild constipation. 10/05/21   Allie Bossier, MD  ranolazine (RANEXA) 500 MG 12 hr tablet Take 1 tablet (500 mg total) by mouth 2 (two) times daily. 09/11/21   Hosie Poisson, MD  sertraline (ZOLOFT) 100 MG tablet Take 1 tablet (100 mg total) by mouth daily. 06/06/21   [name], MD    Physical Exam: Constitutional: Moderately built and nourished. Vitals:   10/18/21 1930 10/18/21 1945 10/18/21 2045 10/18/21 2200  BP: 115/84 124/69 121/72 136/67  Pulse: 81 78 91 95  Resp: (!) 25 (!) _0 Temp:      SpO2: 93% 97% 91% 94%  Weight:      Height:       Eyes: Anicteric no pallor. ENMT: No discharge from the ears eyes nose and mouth. Neck: No mass felt.  No neck rigidity. Respiratory: No rhonchi or crepitations. Cardiovascular: S1-S2 heard. Abdomen: Soft nontender bowel sound present. Musculoskeletal: Mild edema. Skin: On the buttock on the right side there is around 2 cm lesion concerning for possible abscess. Neurologic: Alert awake oriented time place and person.  Moves all extremities. Psychiatric: Appears normal.  Normal affect.   Labs on Admission: I have personally reviewed following labs and imaging studies  CBC: Recent Labs  Lab 10/18/21 1802  WBC 6.2  NEUTROABS 4.3  HGB 10.7*  HCT 34.8*  MCV 85.7  PLT 539   Basic Metabolic Panel: Recent Labs  Lab 10/18/21 1802  NA 134*  K 4.7  CL 92*  CO2 27  GLUCOSE 303*  BUN 10  CREATININE 0.88  CALCIUM 9.2   GFR: Estimated Creatinine Clearance: 64.4 mL/min (by C-G formula based on SCr of 0.88 mg/dL). Liver Function Tests: Recent Labs  Lab 10/18/21 1802  AST 57*  ALT 23  ALKPHOS 124  BILITOT 1.5*  PROT 6.9  ALBUMIN 2.9*   No results for input(s): LIPASE, AMYLASE in the last 168 hours. No results for input(s): AMMONIA in the last 168 hours. Coagulation Profile: No results for input(s): INR, PROTIME in the  last 168 hours. Cardiac Enzymes: No results for input(s): CKTOTAL, CKMB, CKMBINDEX, TROPONINI in the last 168 hours. BNP (last 3 results) No results for input(s): PROBNP in the last 8760 hours. HbA1C: No results for input(s): HGBA1C in the last 72 hours. CBG: No results for input(s): GLUCAP in the last 168 hours. Lipid Profile: No results for input(s): CHOL, HDL, LDLCALC, TRIG, CHOLHDL, LDLDIRECT in the last 72 hours. Thyroid Function Tests: No results for input(s): TSH, T4TOTAL, FREET4, T3FREE, THYROIDAB in the last 72 hours. Anemia Panel: No results for input(s): VITAMINB12, FOLATE, FERRITIN, TIBC, IRON, RETICCTPCT in the last 72 hours. Urine analysis:    Component Value Date/Time   COLORURINE YELLOW 09/20/2021 [place] 09/20/2021 1429  LABSPEC 1.015 09/20/2021 1429   PHURINE 6.0 09/20/2021 1429   GLUCOSEU NEGATIVE 09/20/2021 1429   GLUCOSEU 100 (A) 08/13/2020 1550   HGBUR NEGATIVE 09/20/2021 1429   BILIRUBINUR NEGATIVE 09/20/2021 1429   KETONESUR NEGATIVE 09/20/2021 1429   PROTEINUR NEGATIVE 09/20/2021 1429   UROBILINOGEN 0.2 08/13/2020 1550   NITRITE POSITIVE (A) 09/20/2021 1429   LEUKOCYTESUR MODERATE (A) 09/20/2021 1429   Sepsis Labs: _0 (procalcitonin:4,lacticidven:4) ) Recent Results (from the past 240 hour(s))  Resp Panel by RT-PCR (Flu A&B, Covid) Nasopharyngeal Swab     Status: None   Collection Time: 10/18/21  9:09 PM   Specimen: Nasopharyngeal Swab; Nasopharyngeal(NP) swabs in vial transport medium  Result Value Ref Range Status   SARS Coronavirus 2 by RT PCR NEGATIVE NEGATIVE Final    Comment: (NOTE) SARS-CoV-2 target nucleic acids are NOT DETECTED.  The SARS-CoV-2 RNA is generally detectable in upper respiratory specimens during the acute phase of infection. The lowest concentration of SARS-CoV-2 viral copies this assay can detect is 138 copies/mL. A negative result does not preclude SARS-Cov-2 infection and should not be used as  the sole basis for treatment or other patient management decisions. A negative result may occur with  improper specimen collection/handling, submission of specimen other than nasopharyngeal swab, presence of viral mutation(s) within the areas targeted by this assay, and inadequate number of viral copies(<138 copies/mL). A negative result must be combined with clinical observations, patient history, and epidemiological information. The expected result is Negative.  Fact Sheet for Patients:  EntrepreneurPulse.com.au  Fact Sheet for Healthcare Providers:  IncredibleEmployment.be  This test is no t yet approved or cleared by the [place] FDA and  has been authorized for detection and/or diagnosis of SARS-CoV-2 by FDA under an Emergency Use Authorization (EUA). This EUA will remain  in effect (meaning this test can be used) for the duration of the COVID-19 declaration under Section 564(b)(1) of the Act, 21 U.S.C.section 360bbb-3(b)(1), unless the authorization is terminated  or revoked sooner.       Influenza A by PCR NEGATIVE NEGATIVE Final   Influenza B by PCR NEGATIVE NEGATIVE Final    Comment: (NOTE) The Xpert Xpress SARS-CoV-2/FLU/RSV plus assay is intended as an aid in the diagnosis of influenza from Nasopharyngeal swab specimens and should not be used as a sole basis for treatment. Nasal washings and aspirates are unacceptable for Xpert Xpress SARS-CoV-2/FLU/RSV testing.  Fact Sheet for Patients: EntrepreneurPulse.com.au  Fact Sheet for Healthcare Providers: IncredibleEmployment.be  This test is not yet approved or cleared by the [place] FDA and has been authorized for detection and/or diagnosis of SARS-CoV-2 by FDA under an Emergency Use Authorization (EUA). This EUA will remain in effect (meaning this test can be used) for the duration of the COVID-19 declaration under Section 564(b)(1) of  the Act, 21 U.S.C. section 360bbb-3(b)(1), unless the authorization is terminated or revoked.  Performed at [facility], [place] [address], [place], [place] [address]      Radiological Exams on Admission: DG Chest Portable 1 View  Result Date: 10/18/2021 CLINICAL DATA:  Worsening shortness of breath today. EXAM: PORTABLE CHEST 1 VIEW COMPARISON:  Radiograph 10/03/2021, CT 08/05/2021 FINDINGS: Significant patient rotation. Post median sternotomy and CABG. Cardiomegaly stable. There are persistent bilateral heterogeneous pulmonary opacities with slight improvement from prior exam. No new or acute airspace disease. No pleural effusion or pneumothorax. No acute osseous findings. IMPRESSION: 1. Persistent bilateral heterogeneous pulmonary opacities with slight improvement from prior exam. This may represent multifocal pneumonia,  scarring, or pulmonary edema. 2. Stable cardiomegaly. Electronically Signed   By: Keith Rake M.D.   On: 10/18/2021 18:24    EKG: Independently reviewed.  A-fib with RVR.  Assessment/Plan Active Problems:   CAD S/P percutaneous coronary angioplasty - PCI x 5 to SVG-D1   Renal transplant disorder   Essential hypertension   Type 2 diabetes mellitus with hyperlipidemia (HCC)   ESRD (end stage renal disease) (HCC)   Acute on chronic systolic CHF (congestive heart failure) (HCC)   Acute on chronic congestive heart failure (HCC)   Atrial fibrillation with rapid ventricular response ([place])    Acute on chronic CHF last EF measured was 55 to 60% on February fifth 2023.  I have placed patient on Lasix 20 mg IV daily closely monitor intake output Daily weights. A-fib with RVR likely contributing to patient's symptoms.  Patient is on metoprolol 25 p.o. 3 times daily which will be continued.  Patient states she does takes Eliquis.  Since patient has a lesion on the buttock which is concerning for abscess may need procedure keep patient on heparin. Possible right buttock  abscess for which I ordered CT pelvis.  If it does show abscess will need surgical consult.  We will keep patient on empiric antibiotics. Diabetes mellitus type 2 with hyperglycemia last hemoglobin A1c was 7.9 2 months ago.  We will keep patient on Lantus 10 units along with sliding scale coverage. History of renal transplant on azathioprine and prednisone which will be continued. CAD status post CABG trend cardiac markers.  On heparin and Imdur and Ranexa. On hospice presently takes Roxanol and Ativan.  Consult hospice. Patient is bedbound.  Since patient has acute CHF with A-fib with RVR possible abscess in the right buttock and will need close monitoring inpatient status.  DVT prophylaxis: Heparin infusion. Code Status: DNR. Family Communication: Discussed with patient. Disposition Plan: To be determined. Consults called: Hospice. Admission status: Inpatient.   Rise Patience MD [facility] Pager [phone].  If 7PM-7AM, please contact night-coverage [email or web address] Password [email or web address]  10/19/2021, 12:01 AM

## 2021-10-19 NOTE — Progress Notes (Addendum)
ANTICOAGULATION CONSULT NOTE - Initial Consult ? ?Pharmacy Consult for IV heparin ?Indication: atrial fibrillation ? ?Allergies  ?Allergen Reactions  ? Tetracycline Hives  ?  Patient tolerated Doxycycline Dec 2020  ? Niacin Other (See Comments)  ?  Mouth blisters  ? Niaspan [Niacin Er] Other (See Comments)  ?  Mouth blisters  ? Sulfa Antibiotics Nausea Only and Other (See Comments)  ?  "Tears up stomach"  ? Sulfonamide Derivatives Other (See Comments)  ?  Reaction: per patient "tears her stomach up"  ? Codeine Nausea And Vomiting  ? Erythromycin Nausea And Vomiting  ? Hydromorphone Hcl Nausea And Vomiting  ? Morphine And Related Nausea And Vomiting  ? Nalbuphine Nausea And Vomiting  ?  Nubain  ? Sulfasalazine Nausea Only and Other (See Comments)  ?  per patient "tears her stomach up", "Tears up stomach"  ? Tape Rash and Other (See Comments)  ?  No "plastic" tape," please----cloth tape only  ? ? ?Patient Measurements: ?Height: '5\' 1"'$  (154.9 cm) ?Weight: 86.2 kg (190 lb) ?IBW/kg (Calculated) : 47.8 ?Heparin Dosing Weight: 67.7 kg ? ?Vital Signs: ?Temp: 99.6 ?F (37.6 ?C) (03/05 0019) ?Temp Source: Axillary (03/05 0019) ?BP: 136/67 (03/04 2200) ?Pulse Rate: 39 (03/05 0021) ? ?Labs: ?Recent Labs  ?  10/18/21 ?1802 10/18/21 ?2002  ?HGB 10.7*  --   ?HCT 34.8*  --   ?PLT 330  --   ?CREATININE 0.88  --   ?TROPONINIHS 179* 172*  ? ? ?Estimated Creatinine Clearance: 64.4 mL/min (by C-G formula based on SCr of 0.88 mg/dL). ? ? ?Medical History: ?Past Medical History:  ?Diagnosis Date  ? Anemia   ? Anxiety   ? Bilateral carotid artery stenosis   ? Carotid duplex 04/4173: 0-81% LICA, 44-81% RICA, >85% RECA, f/u 1 yr suggested  ? CAD (coronary artery disease) of bypass graft 5/01; 3/'02, 8/'03, 10/'04; 1/15  ? PCI x 5 to SVG-D1   ? CAD in native artery 07/1993  ? 3 Vessel Disease (LAD-D1 & RCA) -- CABG (Dx in setting of inferior STEMI-PTCA of RCA)  ? COPD mixed type ([place])   ? Followed by Dr. Lamonte Sakai "pulmonologist said no COPD"  ?  Depression with anxiety   ? Diabetes mellitus type 2 in obese [facility])   ? Diarrhea   ? started after cholecystectomy and mass removed from intestine  ? Dyslipidemia, goal LDL below 70   ? 08/2012: TC 137, TG 200, HDL 32!, LDL 45; on statin (followed by Dr.Deterding)  ? ESRD (end stage renal disease) ([place]) 1991  ? s/p Cadaveric Renal Transplant [facility] - Dr. Jimmy Footman)   ? Family history of adverse reaction to anesthesia   ? mom's bp dropped during/after anesthesia  ? Fibromyalgia   ? GERD (gastroesophageal reflux disease)   ? H/O: GI bleed   ? History of kidney stones   ? History of stroke 2012  ? "right eye stroke- half blind now"  ? 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 Witnessed syncopal event.  Had having having lots of nausea and vomiting with poor p.o. intake.  Thought to have QT prolongation with multiple medications involved and hypomagnesemia, hypokalemia.  Tikosyn discontinued along with Zoloft and Phenergan.  ? Hypertension associated with diabetes ([place])   ? Mild aortic stenosis by prior echocardiogram 07/2019  ? Echo:  Mild aortic stenosis (gradients: Mean 14.3 mmHg -peak 24.9 mmHg).  ? Morbid obesity ([place])   ? MRSA (methicillin resistant staph aureus) culture positive   ?  OSA (obstructive sleep apnea)   ? no longer on CPAP or home O2, states she doesn't need now after lap band  ? PAD (peripheral artery disease) ([place]) 08/2013  ? LEA Dopplers to be read by Dr. Fletcher Anon  ? PAF (paroxysmal atrial fibrillation) ([place]) 06/2014  ? Noted on CardioNet Monitor  - --> rhythm control with Tikosyn (Dr. Rayann Heman); converted from warfarin to apixaban for anticoagulation.  ? Pneumonia   ? Recurrent boils   ? Bilateral Groin  ? Rheumatoid arthritis ([place])   ? Per Patient Report; associated with OA  ? S/p cadaver renal transplant 1991  ? [place]  ? ? ?Assessment: ?Patient presented to the hospital in afib, pharmacy consulted for heparin dosing ?Patient dosing weight is 67.7 kg (calculated AdjBW due to TBW > 125% of  IBW) ? ?Goal of Therapy:  ?Heparin level 0.3-0.7 units/ml ?Monitor platelets by anticoagulation protocol: Yes ?  ?Plan: ?Start heparin infusion at 1000 units/hr ?Check anti-Xa and aPTT level in 8 hours and daily while on heparin ?Continue to monitor H&H and platelets ? ?Alene Mires Gahel Safley ?10/19/2021,12:29 AM ? ? ?

## 2021-10-19 NOTE — Progress Notes (Signed)
ANTICOAGULATION CONSULT NOTE - Follow Up Consult ? ?Pharmacy Consult for IV heparin transition to apixaban ?Indication: atrial fibrillation ? ?Allergies  ?Allergen Reactions  ? Tetracycline Hives  ?  Patient tolerated Doxycycline Dec 2020  ? Niacin Other (See Comments)  ?  Mouth blisters  ? Niaspan [Niacin Er] Other (See Comments)  ?  Mouth blisters  ? Sulfa Antibiotics Nausea Only and Other (See Comments)  ?  "Tears up stomach"  ? Sulfonamide Derivatives Other (See Comments)  ?  Reaction: per patient "tears her stomach up"  ? Codeine Nausea And Vomiting  ? Erythromycin Nausea And Vomiting  ? Hydromorphone Hcl Nausea And Vomiting  ? Morphine And Related Nausea And Vomiting  ? Nalbuphine Nausea And Vomiting  ?  Nubain  ? Sulfasalazine Nausea Only and Other (See Comments)  ?  per patient "tears her stomach up", "Tears up stomach"  ? Tape Rash and Other (See Comments)  ?  No "plastic" tape," please----cloth tape only  ? ? ?Patient Measurements: ?Height: '5\' 1"'$  (154.9 cm) ?Weight: 86.2 kg (190 lb) ?IBW/kg (Calculated) : 47.8 ?Heparin Dosing Weight: 67.7 kg ? ?Vital Signs: ?Temp: 98.1 ?F (36.7 ?C) (03/05 0754) ?Temp Source: Oral (03/05 1051) ?BP: 118/84 (03/05 1051) ?Pulse Rate: 110 (03/05 1051) ? ?Labs: ?Recent Labs  ?  10/18/21 ?1802 10/18/21 ?2002 10/19/21 ?0217 10/19/21 ?1055  ?HGB 10.7*  --  10.8*  --   ?HCT 34.8*  --  36.0  --   ?PLT 330  --  414*  --   ?APTT  --   --   --  42*  ?HEPARINUNFRC  --   --   --  0.16*  ?CREATININE 0.88  --  0.91  --   ?TROPONINIHS 179* 172*  --   --   ? ? ?Estimated Creatinine Clearance: 62.3 mL/min (by C-G formula based on SCr of 0.91 mg/dL). ? ?Assessment: ?66 yo female admitted on 3/4. Hx of afib. Patient states she takes apixaban PTA, but does not remember her dose and is confused about when she last took it. Pharmacy was initially consulted for heparin and is now being consulted to transition from heparin to apixaban.  ? ?Hemoglobin is stable at 10.8, platelets at 414. No issues  noted per RN. Patient is <24 years old, <60 kg, and Scr <1.5 so will initiate apixaban 5 mg BID.  ? ?Goal of Therapy:  ?Monitor platelets by anticoagulation protocol: Yes ?  ?Plan:  ?Turn off heparin drip ?Start apixaban 5 mg PO BID ?Give first dose of apixaban when heparin is turned off ?Monitor CBC, s/sx of bleeding ? ?Thank you for including pharmacy in the care of this patient. ? ?Zenaida Deed, PharmD ?PGY1 Acute Care Pharmacy Resident  ?Phone: [phone] ?10/19/2021  1:14 PM ? ?Please check [email or web address] for unit-specific pharmacy phone numbers. ? ? ? ?

## 2021-10-20 ENCOUNTER — Other Ambulatory Visit (HOSPITAL_COMMUNITY): Payer: Self-pay

## 2021-10-20 DIAGNOSIS — L03317 Cellulitis of buttock: Secondary | ICD-10-CM

## 2021-10-20 DIAGNOSIS — Z7189 Other specified counseling: Secondary | ICD-10-CM

## 2021-10-20 DIAGNOSIS — R5381 Other malaise: Secondary | ICD-10-CM

## 2021-10-20 DIAGNOSIS — I5023 Acute on chronic systolic (congestive) heart failure: Secondary | ICD-10-CM

## 2021-10-20 LAB — HEMOGLOBIN A1C
Hgb A1c MFr Bld: 7.8 % — ABNORMAL HIGH (ref 4.8–5.6)
Mean Plasma Glucose: 177.16 mg/dL

## 2021-10-20 LAB — GLUCOSE, CAPILLARY
Glucose-Capillary: 158 mg/dL — ABNORMAL HIGH (ref 70–99)
Glucose-Capillary: 258 mg/dL — ABNORMAL HIGH (ref 70–99)
Glucose-Capillary: 263 mg/dL — ABNORMAL HIGH (ref 70–99)
Glucose-Capillary: 354 mg/dL — ABNORMAL HIGH (ref 70–99)
Glucose-Capillary: 401 mg/dL — ABNORMAL HIGH (ref 70–99)

## 2021-10-20 LAB — CBC
HCT: 36.6 % (ref 36.0–46.0)
Hemoglobin: 11 g/dL — ABNORMAL LOW (ref 12.0–15.0)
MCH: 25.5 pg — ABNORMAL LOW (ref 26.0–34.0)
MCHC: 30.1 g/dL (ref 30.0–36.0)
MCV: 84.7 fL (ref 80.0–100.0)
Platelets: 365 10*3/uL (ref 150–400)
RBC: 4.32 MIL/uL (ref 3.87–5.11)
RDW: 18 % — ABNORMAL HIGH (ref 11.5–15.5)
WBC: 4.2 10*3/uL (ref 4.0–10.5)
nRBC: 0 % (ref 0.0–0.2)

## 2021-10-20 LAB — MRSA NEXT GEN BY PCR, NASAL: MRSA by PCR Next Gen: NOT DETECTED

## 2021-10-20 MED ORDER — INSULIN ASPART 100 UNIT/ML IJ SOLN
0.0000 [IU] | Freq: Three times a day (TID) | INTRAMUSCULAR | Status: DC
  Administered 2021-10-20: 5 [IU] via SUBCUTANEOUS
  Administered 2021-10-20: 9 [IU] via SUBCUTANEOUS
  Administered 2021-10-21: 3 [IU] via SUBCUTANEOUS
  Administered 2021-10-21: 9 [IU] via SUBCUTANEOUS
  Administered 2021-10-21: 5 [IU] via SUBCUTANEOUS
  Administered 2021-10-22: 2 [IU] via SUBCUTANEOUS

## 2021-10-20 MED ORDER — INSULIN GLARGINE-YFGN 100 UNIT/ML ~~LOC~~ SOLN
12.0000 [IU] | Freq: Every day | SUBCUTANEOUS | Status: DC
Start: 2021-10-21 — End: 2021-10-22
  Administered 2021-10-21 – 2021-10-22 (×2): 12 [IU] via SUBCUTANEOUS
  Filled 2021-10-20 (×2): qty 0.12

## 2021-10-20 MED ORDER — FUROSEMIDE 10 MG/ML IJ SOLN
40.0000 mg | Freq: Every day | INTRAMUSCULAR | Status: DC
  Administered 2021-10-21 – 2021-10-22 (×2): 40 mg via INTRAVENOUS
  Filled 2021-10-20 (×2): qty 4

## 2021-10-20 MED ORDER — INSULIN ASPART 100 UNIT/ML IJ SOLN
8.0000 [IU] | Freq: Once | INTRAMUSCULAR | Status: AC
  Administered 2021-10-20: 8 [IU] via SUBCUTANEOUS

## 2021-10-20 MED ORDER — OXYCODONE HCL 5 MG PO TABS
10.0000 mg | ORAL_TABLET | Freq: Four times a day (QID) | ORAL | Status: DC | PRN
Start: 2021-10-20 — End: 2021-10-22
  Administered 2021-10-20 – 2021-10-22 (×5): 10 mg via ORAL
  Filled 2021-10-20 (×5): qty 2

## 2021-10-20 MED ORDER — INSULIN ASPART 100 UNIT/ML IJ SOLN
0.0000 [IU] | Freq: Every day | INTRAMUSCULAR | Status: DC
  Administered 2021-10-20 – 2021-10-21 (×2): 3 [IU] via SUBCUTANEOUS

## 2021-10-20 NOTE — Assessment & Plan Note (Addendum)
Patient had last ejection fraction of 55 to 60%.  Received IV Lasix.  Continue intake and output charting, daily weights.  Continue ranolazine, Imdur.  5 pound weight loss documented in the computer.  Negative balance for 897 mL committed so far in the computer.  Was on 20 mg of IV Lasix and and was changed to 40 mg.  Patient will resume oral Lasix on discharge.  On 3 L of oxygen by nasal cannula. ?

## 2021-10-20 NOTE — Assessment & Plan Note (Signed)
Deconditioning, functional decline with ambulatory dysfunction.  Patient is currently on hospice at home. ?

## 2021-10-20 NOTE — Assessment & Plan Note (Signed)
History of ESRD.  Continue azathioprine and prednisone. ?

## 2021-10-20 NOTE — TOC Benefit Eligibility Note (Addendum)
Patient Advocate Encounter ?  ?Insurance verification completed.   ?  ?The patient is currently admitted and upon discharge could be taking azaTHIOprine . ?  ?This medication requires a prior authorization. PA has been submitted [id number].  ? ?The patient is insured through [employer]. ? ? ?   ?

## 2021-10-20 NOTE — Plan of Care (Signed)
  Problem: Health Behavior/Discharge Planning: Goal: Ability to manage health-related needs will improve Outcome: Progressing   Problem: Clinical Measurements: Goal: Ability to maintain clinical measurements within normal limits will improve Outcome: Progressing Goal: Will remain free from infection Outcome: Progressing Goal: Diagnostic test results will improve Outcome: Progressing Goal: Respiratory complications will improve Outcome: Progressing Goal: Cardiovascular complication will be avoided Outcome: Progressing   Problem: Nutrition: Goal: Adequate nutrition will be maintained Outcome: Progressing   Problem: Coping: Goal: Level of anxiety will decrease Outcome: Progressing   

## 2021-10-20 NOTE — Assessment & Plan Note (Addendum)
CT of the pelvis did not show any evidence of abscess.  Patient was on on vancomycin and cefepime.  Temp max of 99.4 ?F.  WBC at 5.7 we will de-escalate antibiotic to Bactrim DS on discharge. ?

## 2021-10-20 NOTE — Assessment & Plan Note (Addendum)
Continue metoprolol 3 times daily.  Continue Eliquis.  Currently rate controlled. ?

## 2021-10-20 NOTE — Assessment & Plan Note (Addendum)
Continue Imdur, Lasix, metoprolol, ranolazine.  Blood pressure is borderline. ?

## 2021-10-20 NOTE — TOC Initial Note (Addendum)
Transition of Care (TOC) - Initial/Assessment Note  ? ? ?Patient Details  ?Name: Katherine [name] ?MRN: [id number] ?Date of Birth: July 30, 1956 ? ?Transition of Care (TOC) CM/SW Contact:    ?Zenon Mayo, RN ?Phone Number: ?10/20/2021, 2:21 PM ? ?Clinical Narrative:                 ?From home with brother, w/chair bound, sob, afib, xray concerning for PNA, on iv abx, iv lasix.  She is with [facility], she is on 4 liters of oxygen at home thru [place], she also has grab bars. She will need ambulance transport at discharge. TOC will continue to follow for dc needs.  ? ?  ?  ? ? ?Patient Goals and CMS Choice ?  ?  ?  ? ?Expected Discharge Plan and Services ?  ?  ?  ?  ?  ?                ?  ?  ?  ?  ?  ?  ?  ?  ?  ?  ? ?Prior Living Arrangements/Services ?  ?  ?  ?       ?  ?  ?  ?  ? ?Activities of Daily Living ?Home Assistive Devices/Equipment: Bedside commode/3-in-1, Dentures (specify type), Eyeglasses, Grab bars in shower, Grab bars around toilet, Oxygen, Nebulizer, Wheelchair ?ADL Screening (condition at time of admission) ?Patient's cognitive ability adequate to safely complete daily activities?: Yes ?Is the patient deaf or have difficulty hearing?: Yes ?Does the patient have difficulty seeing, even when wearing glasses/contacts?: No ?Does the patient have difficulty concentrating, remembering, or making decisions?: No ?Patient able to express need for assistance with ADLs?: Yes ?Does the patient have difficulty dressing or bathing?: Yes ?Independently performs ADLs?: Yes (appropriate for developmental age) ?Does the patient have difficulty walking or climbing stairs?: Yes ?Weakness of Legs: Both ?Weakness of Arms/Hands: None ? ?Permission Sought/Granted ?  ?  ?   ?   ?   ?   ? ?Emotional Assessment ?  ?  ?  ?  ?  ?  ? ?Admission diagnosis:  Acute CHF (congestive heart failure) ([place]) [I50.9] ?Atrial fibrillation with rapid ventricular response ([place]) [I48.91] ?Palliative care encounter [Z51.5] ?Hospice  care patient [Z51.5] ?Acute on chronic congestive heart failure, unspecified heart failure type ([place]) [I50.9] ?Patient Active Problem List  ? Diagnosis Date Noted  ? Cellulitis of right buttock 10/20/2021  ? Acute on chronic congestive heart failure ([place]) 10/18/2021  ? Atrial fibrillation with rapid ventricular response ([place]) 10/18/2021  ? ACS (acute coronary syndrome) ([place]) 10/03/2021  ? Goals of care, counseling/discussion 10/03/2021  ? Acute on chronic diastolic CHF (congestive heart failure) ([place]) 09/25/2021  ? Class 2 obesity 09/25/2021  ? E-coli UTI 09/22/2021  ? questionable pneumonia on CXR 09/20/2021  ? Dysuria 09/20/2021  ? Rest pain of lower extremity due to atherosclerosis ([place]) 09/02/2021  ? Longstanding persistent atrial fibrillation ([place])   ? MRSA pneumonia ([place]) 08/09/2021  ? Chronic diastolic CHF (congestive heart failure) ([place]) 08/06/2021  ? Obesity (BMI 30-39.9) 08/05/2021  ? Acute on chronic respiratory failure with hypoxia ([place]) 08/04/2021  ? Pressure injury of skin 08/04/2021  ? Polymorphic ventricular tachycardia   ? Bradycardia   ? COPD (chronic obstructive pulmonary disease) ([place])   ? History of renal transplant   ? Nausea & vomiting 04/19/2021  ? Coronary stent restenosis due to scar tissue   ? Acute on chronic systolic CHF (congestive heart failure) (  [place]) 01/29/2021  ? Non-ST elevation (NSTEMI) myocardial infarction [facility])   ? Chronic respiratory failure with hypoxia ([place]) 01/01/2021  ? Need for pneumococcal vaccination 08/13/2020  ? Candidal skin infection 08/13/2020  ? Anxiety and depression 08/13/2020  ? Chronic bronchitis, mucopurulent ([place]) 08/24/2019  ? Current chronic use of systemic steroids 07/23/2019  ? OSA (obstructive sleep apnea)   ? Severe episode of recurrent major depressive disorder, with psychotic features ([place]) 02/16/2019  ? Obstructive chronic bronchitis without exacerbation ([place]) 11/03/2018  ? Long term (current) use of antithrombotics/antiplatelets 04/28/2018  ?  Squamous cell carcinoma of skin of right thumb 02/02/2018  ? LBBB (left bundle branch block) 10/07/2017  ? Primary osteoarthritis of right knee 08/18/2017  ? Ovarian cyst 05/18/2017  ? Pelvic mass in female 03/11/2017  ? Mild aortic stenosis by prior echocardiogram 01/28/2017  ? 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 Deficiency anemia 10/14/2016  ? Normocytic anemia 10/13/2016  ? Fibromyalgia 03/30/2016  ? Other spondylosis with radiculopathy, lumbar region 03/30/2016  ? Type 2 diabetes mellitus with hyperlipidemia ([place]) 03/30/2016  ? Paroxysmal atrial fibrillation ([place]); CHA2DS2VASc score F, HTN, CAD, CVA = 5 06/28/2014  ? Debility 06/28/2014  ? Fluttering heart 06/21/2014  ? CAD S/P percutaneous coronary angioplasty - PCI x 5 to SVG-D1 09/11/2013  ?  Class: Diagnosis of  ? Essential hypertension   ? NSTEMI (non-ST elevated myocardial infarction) with CAD S/P percutaneous coronary angioplasty - PCI x 5 to SVG-D1 08/23/2013  ? PAD (peripheral artery disease) ([place]) 08/17/2013  ? CAD (coronary artery disease) of bypass graft 10/08/2012  ? Stenosis of right carotid artery without infarction 10/08/2012  ? Dyslipidemia, goal LDL below 70 10/08/2012  ?  Class: Diagnosis of  ? Mitral annular calcification 10/08/2012  ? Hypomagnesemia 06/13/2012  ? Hypokalemia and hypomagnesia  06/12/2012  ? Renal transplant disorder 06/12/2012  ? Chronic allergic rhinitis 04/29/2011  ? Extrinsic asthma 09/09/2007  ? GERD 09/09/2007  ? COUGH, CHRONIC 09/09/2007  ? Morbid obesity - s/p Lap Band 9/'05 05/07/2004  ?  Class: Diagnosis of  ? Hx of CABG 09/07/1993  ?  Class: History of  ? 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 07/1993  ? Coronary artery disease involving native coronary artery of native heart with angina pectoris ([place]) 07/1993  ? ESRD (end stage renal disease) ([place]) 1991  ? ?PCP:  [name], MD ?Pharmacy:   ?[id number] [place] [place] ?[address] ?[place] [address]?Phone: [phone]: [phone]? ?[facility] ?[address]?Phone: [phone]: [phone] ? ? ? ? ?Social Determinants of Health (SDOH) Interventions ?  ? ?Readmission Risk Interventions ?Readmission Risk Prevention Plan 09/23/2021 09/08/2021 05/14/2021  ?Transportation Screening Complete Complete Complete  ?HCareyor Home Care Consult - - -  ?Social Work Consult for [place]villePlanning/Counseling - - -  ?Palliative Care Screening - - -  ?Medication Review (Press photographer Complete Complete Complete  ?PCP or Specialist appointment within 3-5 days of discharge Complete Complete Complete  ?[place] Cityor Home Care Consult Complete Complete Complete  ?SW Recovery Care/Counseling Consult Complete Complete Complete  ?Palliative Care Screening Complete Not Applicable Not Applicable  ?[place] Applicable Not Applicable Not Applicable  ?Some recent data might be hidden  ? ? ? ?

## 2021-10-20 NOTE — Progress Notes (Signed)
?                                                   ?Palliative Care Progress Note, Assessment & Plan  ? ?Patient Name: Katherine [name]       Date: 10/20/2021 ?DOB: July 10, 1956  Age: 66 y.o. MRN#: [id number] ?Attending Physician: Flora Lipps, MD ?Primary Care Physician: [name], MD ?Admit Date: 10/18/2021 ? ?Reason for Consultation/Follow-up: Establishing goals of care ? ?Subjective: ?Patient is lying in bed in no apparent distress.  She awakens as I enter the room.  She is able to acknowledge my presence and is able to make her needs known.  She denies pain, shortness of breath, and anxiety at this time.  No family at bedside. ? ?HPI: ?66 y.o. 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 ?  ?This is patient's 8th admission in the last 6 months. At her previous admission, patient decided to shift to full comfort measures and discharged home on 2/19 with [facility] support.  ? ?Summary of counseling/coordination of care: ?After reviewing the patient's chart, I spoke with community hospice nurse Katherine [name] to review course of events between patient's previous hospitalization and this hospitalization.   ? ?Katherine [name] shares that various attempts were made (ie oral morphine, oxygen concentrator, ativan, patient education) to treat symptoms of shortness of breath, chest pain, and anxiety with the patient while she was at home.  However, despite Hospice's efforts to  triage patient at home patient's brother Katherine [name] called 911/EMS for patient to be brought to the hospital for this hospitalization.   ? ?After reviewing the patient's chart and discussing her care with Katherine [name], I met with the patient to discuss goals of care.  ? ?I attempted to ellicit goals important to the patient. Patient shares she knows she has a bad heart and that one of "these times I am just going to pass away". She says she wants to pass away at home and does not ever want to be placed in an assisted living facility. She c/o not being able to eat and drink what she wants given her fluid restriction, heart healthy diet, and hyperglycemia. I reviewed that at her last hospitalizations her goals was to be kept comfortable and avoid aggressive medical interventions. She confirmed she wishes to be kept comfortable, but then also says she wants to have her the boil on her skin evaluated and taken care of, antibiotics for UTI, and breathing treatments and medications to treat her PNA. Then, she wants to go home and be kept comfortable.  ? ?Education provided on comfort care vs full scope. I highlighted that hospice services and full scope are two different plans of care with two different goals. Hospice and comfort care would involve strictly focusing on symptoms and addressing her pain, anxiety, SOB, etc. Full scope of treatment involves CBGs, vital sign checks, diet and fluid restrictions, blood draws, and appropriate procedures/interventions to treat the treatable. Pt again stated she wants everything addressed during this hospitalization and  then she wants to go home with hospice. ? ?Discussed need for ensuring her brother and family understand her goal is to age in place at home and avoid rehospitalization. Patient states she knows this is just going to keep happening since she panics and does not know what to do other than call 911 when she feels like she is going to die. Education provided on utilizing  hospice medical team (nurses, social workers, chaplains) and medications to manage physical symptoms and emotional and spiritual needs, but continued education needed.  ? ?Pt confirmed she has completed and notarized a medical POA naming her niece Katherine [name] as Economist. Pt does not have copy of HCPOA and confirmed she is in agreement for me to speak with Katherine [name] regarding this ACP documentation.  ? ?Questions and concerns were addressed.  ? ?Code Status: ?DNR ? ?Prognosis: ?Unable to determine ? ?Discharge Planning: ?Home with Hospice ? ?Recommendations/Plan: ?Treat the treatable ?DNR remains ?At discharge, pt wishes to return home with hospice ? ?Care plan was discussed with patient, nursing ? ?Physical Exam ?Vitals and nursing note reviewed.  ?Constitutional:   ?   General: She is not in acute distress. ?   Appearance: She is not ill-appearing.  ?HENT:  ?   Head: Normocephalic.  ?Cardiovascular:  ?   Rate and Rhythm: Normal rate.  ?Pulmonary:  ?   Effort: Pulmonary effort is normal.  ?   Comments: [place] in place ?Abdominal:  ?   Palpations: Abdomen is soft.  ?Musculoskeletal:  ?   Cervical back: Normal range of motion.  ?   Comments: Bed bound  ?Neurological:  ?   Mental Status: She is alert and oriented to person, place, and time.  ?Psychiatric:     ?   Mood and Affect: Mood is anxious.     ?   Behavior: Behavior normal.  ?         ? ?Palliative Assessment/Data: 40% ? ? ? ?Total Time 35 minutes  ?Greater than 50%  of this time was spent counseling and coordinating care related to the above assessment and plan. ? ?Thank you for allowing the Palliative Medicine Team to assist in the care of this patient. ? ?Verdell Carmine. Shaquella Stamant, DNP, FNP-BC ?Palliative Medicine Team ?Team Phone # [phone] ?  ?

## 2021-10-20 NOTE — Progress Notes (Signed)
This chaplain canceled the spiritual care consult for creating/updating an Advance Directive. ? ?This chaplain understands from communication with PMT NP-Kathryn the Pt. has an Forensic scientist. The chaplain understands the Pt. daughter-Sherry will bring the Pt. AD for scanning into the Pt. EMR.  ? ?Chaplain Sallyanne Kuster ?[phone] ? ? ?

## 2021-10-20 NOTE — Assessment & Plan Note (Addendum)
With hyperglycemia.  On prednisone.  Latest hemoglobin A1c was 7.9, 2 months ago.  Continue insulin regimen from home. ?

## 2021-10-20 NOTE — Progress Notes (Signed)
PROGRESS NOTE    Katherine [name]  EXB:[id number] DOB: [birth year] DOA: 10/18/2021 PCP: [name], MD    Brief Narrative:  Katherine [name] is a 66 y.o. female with history of atrial fibrillation, anxiety, coronary artery disease, COPD, depression, diabetes mellitus type 2, fibromyalgia, hypertension, peripheral vascular disease, history of COVID, coronary artery disease who was recently admitted hospital for non-ST elevation MI management  was discharged home on hospice but presented back to the hospital with worsening shortness of breath and palpitations.  In the ED, patient was noted to have atrial fibrillation with RVR and metoprolol was given including Lasix.  Given the persistent shortness of breath and heart rate being high, patient was admitted for further evaluation and treatment.    Assessment and Plan: Acute on chronic systolic CHF (congestive heart failure) ([place]) Patient had last ejection fraction of 55 to 60%.  Continue IV Lasix.  Intake and output charting, daily weights.  Continue ranolazine, Imdur.  5 pound weight loss documented in the computer.  Negative balance for 273 mL so far.  Currently on 20 mg of IV Lasix.  Will increase to 40.  On 3 L of oxygen cannula.  CAD S/P percutaneous coronary angioplasty - PCI x 5 to SVG-D1 Continue Imdur, Ranexa, Eliquis.  Atrial fibrillation with rapid ventricular response (HCC) Continue metoprolol 3 times daily.  Continue Eliquis.  Currently rate controlled.  Cellulitis of right buttock CT of the pelvis did not show any evidence of abscess.  Currently on vancomycin and cefepime.  Temp max of 98.4 F.  WBC at 4.2.  Type 2 diabetes mellitus with hyperlipidemia (HCC) With hyperglycemia.  On prednisone.  Latest hemoglobin A1c was 7.9, 2 months ago.  Continue Lantus and sliding scale insulin.  Latest POC glucose of 354.  Hemoglobin A1c at 7.8.  We will adjust insulin regimen today.  Debility Deconditioning, functional decline with  ambulatory dysfunction.  Patient is currently on hospice at home.  Essential hypertension Continue Imdur, Lasix, metoprolol, ranolazine.  Blood pressure is borderline.  Renal transplant disorder History of ESRD.  Continue azathioprine and prednisone.      DVT prophylaxis:  apixaban (ELIQUIS) tablet 5 mg   Code Status:     Code Status: DNR  Disposition:  home hospice in 1 to 2 days..  Patient's brother is the caretaker for the patient.  Status is: Inpatient Remains inpatient appropriate because: Pending clinical improvement.   Family Communication: Spoke with the patient at bedside.  Consultants:  Palliative care  Procedures:  None  Antimicrobials:  Vancomycin and cefepime  Anti-infectives (From admission, onward)    Start     Dose/Rate Route Frequency Ordered Stop   10/20/21 0000  vancomycin (VANCOCIN) IVPB 1000 mg/200 mL premix        1,000 mg 200 mL/hr over 60 Minutes Intravenous Every 24 hours 10/19/21 1239     10/19/21 1200  vancomycin (VANCOCIN) IVPB 1000 mg/200 mL premix  Status:  Discontinued        1,000 mg 200 mL/hr over 60 Minutes Intravenous Every 12 hours 10/19/21 0255 10/19/21 1239   10/19/21 0800  ceFEPIme (MAXIPIME) 2 g in sodium chloride 0.9 % 100 mL IVPB        2 g 200 mL/hr over 30 Minutes Intravenous Every 8 hours 10/19/21 0255     10/19/21 0100  vancomycin (VANCOREADY) IVPB 1750 mg/350 mL        1,750 mg 175 mL/hr over 120 Minutes Intravenous  Once 10/19/21 0004 10/19/21 0305  10/19/21 0100  ceFEPIme (MAXIPIME) 2 g in sodium chloride 0.9 % 100 mL IVPB        2 g 200 mL/hr over 30 Minutes Intravenous  Once 10/19/21 0004 10/19/21 0103      Subjective: Today, patient was seen and examined at bedside.  States that she feels a little more energetic today with less sleepiness.  Has shortness of breath and mild dyspnea.  Objective: Vitals:   10/20/21 0416 10/20/21 0900 10/20/21 0908 10/20/21 1122  BP: 1'08/75 96/71 95/68 '$ 103/77  Pulse: 84  96  92  Resp: '17 20 20 17  '$ Temp: 98.4 F (36.9 C)  98.6 F (37 C) 98.2 F (36.8 C)  TempSrc: Oral   Oral  SpO2: 95%   94%  Weight: 84.2 kg     Height:        Intake/Output Summary (Last 24 hours) at 10/20/2021 1351 Last data filed at 10/20/2021 1312 Gross per 24 hour  Intake 1788.06 ml  Output 1700 ml  Net 88.06 ml   Filed Weights   10/18/21 1741 10/20/21 0416  Weight: 86.2 kg 84.2 kg   Body mass index is 35.07 kg/m.   Physical Examination:  General: Obese built, not in obvious distress, on nasal cannula oxygen, chronically ill. HENT:   No scleral pallor or icterus noted. Oral mucosa is moist.  Chest:  Clear breath sounds.  Diminished breath sounds bilaterally. No crackles or wheezes.  CVS: S1 &S2 heard. No murmur.  Regular rate and rhythm. Abdomen: Soft, nontender, nondistended.  Bowel sounds are heard.   Extremities: No cyanosis, clubbing or edema.  Peripheral pulses are palpable. Psych: Alert, awake and oriented, flat affect. CNS:  No cranial nerve deficits.  Generalized weakness noted. Skin: Warm and dry.  Right buttocks with unstageable 2 cm ulceration.  Data Reviewed:   CBC: Recent Labs  Lab 10/18/21 1802 10/19/21 0217 10/20/21 0437  WBC 6.2 6.7 4.2  NEUTROABS 4.3  --   --   HGB 10.7* 10.8* 11.0*  HCT 34.8* 36.0 36.6  MCV 85.7 84.9 84.7  PLT 330 414* 774    Basic Metabolic Panel: Recent Labs  Lab 10/18/21 1802 10/19/21 0217  NA 134* 131*  K 4.7 3.8  CL 92* 90*  CO2 27 28  GLUCOSE 303* 316*  BUN 10 9  CREATININE 0.88 0.91  CALCIUM 9.2 8.8*    Liver Function Tests: Recent Labs  Lab 10/18/21 1802  AST 57*  ALT 23  ALKPHOS 124  BILITOT 1.5*  PROT 6.9  ALBUMIN 2.9*     Radiology Studies: CT PELVIS WO CONTRAST  Result Date: 10/19/2021 CLINICAL DATA:  66 year old female with possible right buttock infection. EXAM: CT PELVIS WITHOUT CONTRAST TECHNIQUE: Multidetector CT imaging of the pelvis was performed following the standard protocol without  intravenous contrast. RADIATION DOSE REDUCTION: This exam was performed according to the departmental dose-optimization program which includes automated exposure control, adjustment of the mA and/or kV according to patient size and/or use of iterative reconstruction technique. COMPARISON:  CT Abdomen and Pelvis 04/19/2021. FINDINGS: Urinary Tract: Native renal atrophy not included on today's images. Left pelvic renal transplant without hydronephrosis. No hydroureter. Moderately distended but otherwise unremarkable urinary bladder. Bowel: No dilated large or small bowel. Some retained stool in the colon. Partially visible left abdominal gastric band catheter. Vascular/Lymphatic: Severe aortic and bilateral iliofemoral calcified atherosclerosis. Vascular patency is not evaluated in the absence of IV contrast. No lymphadenopathy identified. Reproductive:  Diminutive or absent as before. Other: No pelvic  free fluid. Regressed but not completely resolved right lower abdominal wall subcutaneous inflammation or hematoma since last year, mild residual stranding on series 3, image 16. Musculoskeletal: No convincing perineum or asymmetric buttock inflammation. No soft tissue gas or fluid collection. No acute osseous abnormality identified. IMPRESSION: 1. No convincing buttock cellulitis or acute inflammatory process. No soft tissue gas or fluid collection. 2. Severe Aortic Atherosclerosis (ICD10-I70.0), and iliofemoral atherosclerosis. Cannot exclude iliac or femoral artery occlusion due to plaque. 3. Left pelvic renal transplant. Electronically Signed   By: Genevie Ann M.D.   On: 10/19/2021 07:47   DG Chest Portable 1 View  Result Date: 10/18/2021 CLINICAL DATA:  Worsening shortness of breath today. EXAM: PORTABLE CHEST 1 VIEW COMPARISON:  Radiograph 10/03/2021, CT 08/05/2021 FINDINGS: Significant patient rotation. Post median sternotomy and CABG. Cardiomegaly stable. There are persistent bilateral heterogeneous pulmonary  opacities with slight improvement from prior exam. No new or acute airspace disease. No pleural effusion or pneumothorax. No acute osseous findings. IMPRESSION: 1. Persistent bilateral heterogeneous pulmonary opacities with slight improvement from prior exam. This may represent multifocal pneumonia, scarring, or pulmonary edema. 2. Stable cardiomegaly. Electronically Signed   By: Keith Rake M.D.   On: 10/18/2021 18:24      LOS: 2 days    Flora Lipps, MD [facility] 10/20/2021, 1:51 PM

## 2021-10-20 NOTE — Assessment & Plan Note (Signed)
Continue Imdur, Ranexa, Eliquis. ?

## 2021-10-20 NOTE — Hospital Course (Addendum)
Katherine [name] is a 66 y.o. female with history of atrial fibrillation, anxiety, coronary artery disease, COPD, depression, diabetes mellitus type 2, fibromyalgia, hypertension, peripheral vascular disease, history of COVID, coronary artery disease who was recently admitted hospital for non-ST elevation MI management  was discharged home on hospice but presented back to the hospital with worsening shortness of breath and palpitations.  In the ED, patient was noted to have atrial fibrillation with RVR and metoprolol was given including Lasix.  Given the persistent shortness of breath and heart rate being high, patient was admitted for further evaluation and treatment.  Patient also was noted to have a cellulitis on the back and was started on broad-spectrum antibiotics. ?

## 2021-10-21 ENCOUNTER — Other Ambulatory Visit: Payer: Self-pay

## 2021-10-21 ENCOUNTER — Other Ambulatory Visit (HOSPITAL_COMMUNITY): Payer: Self-pay

## 2021-10-21 DIAGNOSIS — T8610 Unspecified complication of kidney transplant: Secondary | ICD-10-CM

## 2021-10-21 DIAGNOSIS — E876 Hypokalemia: Secondary | ICD-10-CM

## 2021-10-21 LAB — GLUCOSE, CAPILLARY
Glucose-Capillary: 286 mg/dL — ABNORMAL HIGH (ref 70–99)
Glucose-Capillary: 338 mg/dL — ABNORMAL HIGH (ref 70–99)
Glucose-Capillary: 215 mg/dL — ABNORMAL HIGH (ref 70–99)
Glucose-Capillary: 273 mg/dL — ABNORMAL HIGH (ref 70–99)

## 2021-10-21 LAB — BASIC METABOLIC PANEL
Anion gap: 12 (ref 5–15)
BUN: 13 mg/dL (ref 8–23)
CO2: 28 mmol/L (ref 22–32)
Calcium: 9.5 mg/dL (ref 8.9–10.3)
Chloride: 92 mmol/L — ABNORMAL LOW (ref 98–111)
Creatinine, Ser: 0.99 mg/dL (ref 0.44–1.00)
GFR, Estimated: >60 mL/min (ref >60)
Glucose, Bld: 305 mg/dL — ABNORMAL HIGH (ref 70–99)
Potassium: 3.4 mmol/L — ABNORMAL LOW (ref 3.5–5.1)
Sodium: 132 mmol/L — ABNORMAL LOW (ref 135–145)

## 2021-10-21 LAB — CBC
HCT: 35.6 % — ABNORMAL LOW (ref 36.0–46.0)
Hemoglobin: 10.9 g/dL — ABNORMAL LOW (ref 12.0–15.0)
MCH: 26.1 pg (ref 26.0–34.0)
MCHC: 30.6 g/dL (ref 30.0–36.0)
MCV: 85.2 fL (ref 80.0–100.0)
Platelets: 348 10*3/uL (ref 150–400)
RBC: 4.18 MIL/uL (ref 3.87–5.11)
RDW: 18.1 % — ABNORMAL HIGH (ref 11.5–15.5)
WBC: 5.7 10*3/uL (ref 4.0–10.5)
nRBC: 0 % (ref 0.0–0.2)

## 2021-10-21 LAB — MAGNESIUM: Magnesium: 1.5 mg/dL — ABNORMAL LOW (ref 1.7–2.4)

## 2021-10-21 MED ORDER — MAGNESIUM OXIDE -MG SUPPLEMENT 400 (240 MG) MG PO TABS
400.0000 mg | ORAL_TABLET | Freq: Two times a day (BID) | ORAL | Status: DC
  Administered 2021-10-21 – 2021-10-22 (×3): 400 mg via ORAL
  Filled 2021-10-21 (×3): qty 1

## 2021-10-21 MED ORDER — POTASSIUM CHLORIDE CRYS ER 20 MEQ PO TBCR
40.0000 meq | EXTENDED_RELEASE_TABLET | Freq: Two times a day (BID) | ORAL | 0 refills | Status: DC
  Filled 2021-10-21: qty 10, 3d supply, fill #0

## 2021-10-21 MED ORDER — MAGNESIUM OXIDE 400 MG PO TABS
400.0000 mg | ORAL_TABLET | Freq: Two times a day (BID) | ORAL | 0 refills | Status: AC
  Filled 2021-10-21: qty 10, 5d supply, fill #0

## 2021-10-21 MED ORDER — APIXABAN 5 MG PO TABS
5.0000 mg | ORAL_TABLET | Freq: Two times a day (BID) | ORAL | 2 refills | Status: DC
  Filled 2021-10-21: qty 60, 30d supply, fill #0

## 2021-10-21 MED ORDER — AZATHIOPRINE 50 MG PO TABS
125.0000 mg | ORAL_TABLET | ORAL | 2 refills | Status: DC
  Filled 2021-10-21: qty 75, 30d supply, fill #0

## 2021-10-21 MED ORDER — SULFAMETHOXAZOLE-TRIMETHOPRIM 800-160 MG PO TABS
1.0000 | ORAL_TABLET | Freq: Two times a day (BID) | ORAL | 0 refills | Status: AC
  Filled 2021-10-21: qty 10, 5d supply, fill #0

## 2021-10-21 MED ORDER — SULFAMETHOXAZOLE-TRIMETHOPRIM 800-160 MG PO TABS
1.0000 | ORAL_TABLET | Freq: Two times a day (BID) | ORAL | Status: DC
  Administered 2021-10-21 – 2021-10-22 (×3): 1 via ORAL
  Filled 2021-10-21 (×4): qty 1

## 2021-10-21 MED ORDER — POTASSIUM CHLORIDE CRYS ER 20 MEQ PO TBCR
40.0000 meq | EXTENDED_RELEASE_TABLET | Freq: Two times a day (BID) | ORAL | Status: DC
  Administered 2021-10-21 (×2): 40 meq via ORAL
  Filled 2021-10-21 (×2): qty 2

## 2021-10-21 NOTE — Assessment & Plan Note (Addendum)
Mild.   Replenished. ?

## 2021-10-21 NOTE — Plan of Care (Signed)

## 2021-10-21 NOTE — Progress Notes (Signed)
?                                                   ?Palliative Care Progress Note, Assessment & Plan  ? ?Patient Name: Katherine [name]       Date: 10/21/2021 ?DOB: 20-Dec-1955  Age: 66 y.o. MRN#: [id number] ?Attending Physician: Flora Lipps, MD ?Primary Care Physician: [name], MD ?Admit Date: 10/18/2021 ? ?Reason for Consultation/Follow-up: Establishing goals of care ? ?HPI: ?66 y.o. 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 ?  ?This is patient's 8th admission in the last 6 months. At her previous admission, patient decided to shift to full comfort measures and discharged home on 2/19 with [facility] support. ? ?Summary of counseling/coordination of care: ?After reviewing the patient's chart, I received a call from patient's niece/HCPOA [name].  Spoke with [name] over the phone. She shares she had an extensive conversation with patient last night regarding multiple hospitalizations and hospice care. ? ?[name] asked if patient was a candidate for [facility]residential hospice.  I described that patient is not a candidate at this time as I do not think she will pass in the next 2 to 6 weeks.  ? ?I reviewed that comfort care plan has not been continued during this hospitalization since patient is accepting of all offered and available medical treatment for UTI, boil on buttock, etc. Reviewed with [name] that patient's goals are not clear and [name] agrees  that patient waxes and wanes on what she ultimately wants. ? ?I conveyed to [name] that I had a clear discussion with patient yesterday regarding comfort measures vs medical treatment/full scope. [name] believes patient would like to pass at home but that her brother, with whom patient lives, is not accepting of patient being treated for EOL while at home.  ? ?I highlighted to [name] that patient made it clear yesterday that patient wants to treat the treatable and return home with hospice after this hospitalization. I conveyed that [facility] has confirmed patient can re-enroll in hospice at discharge.  ? ?Discussed that if patient wants full scope of treatment that there is option of having patient evaluated for PT/OT and possible SNF for rehab placement, but that patient has refused this in the past. I plan to visit with patient today to further discuss [place].  ? ?Addendum: I rounded on the patient and she was getting AM care/bathing/toileting. I spoke with her nurse and reiterated above discussion as well as [place] discussions I had with patient yesterday. Pt wants to treat the treatable while hospitalized and has clear understanding of plan of care (CBG monitoring, fluid restriction, etc). RN shared pt voiced that [facility] had clarified plan of care. Plan set for d/c tomorrow home with hospice. ? ?Questions and concerns were addressed. PMT will continue to follow the patient throughout her hospitalization.  ? ?Code Status: ?DNR ? ?Prognosis: ?Unable to determine ? ?Discharge Planning: ?Home with hospice ? ? ?Care plan was discussed with niece/HCPOA [name] ? ?Physical Exam ?Vitals  and nursing note reviewed.  ?Constitutional:   ?   Appearance: She is obese. She is not ill-appearing.  ?HENT:  ?   Head: Normocephalic.  ?Cardiovascular:  ?   Rate and Rhythm: Tachycardia present.  ?Pulmonary:  ?   Effort: Pulmonary effort is normal.  ?   Comments: Motley in place ?Musculoskeletal:  ?   Cervical back: Normal range of  motion.  ?   Comments: Bilateral LE weakness  ?Neurological:  ?   Mental Status: She is alert and oriented to person, place, and time.  ?Psychiatric:     ?   Mood and Affect: Mood is anxious.  ?   Comments: tearful  ?         ? ?Palliative Assessment/Data: 40% ? ? ? ?Total Time 35 minutes  ?Greater than 50%  of this time was spent counseling and coordinating care related to the above assessment and plan. ? ?Thank you for allowing the Palliative Medicine Team to assist in the care of this patient. ? ?Verdell Carmine. Dewell Monnier, DNP, FNP-BC ?Palliative Medicine Team ?Team Phone # [phone] ?  ?

## 2021-10-21 NOTE — Consult Note (Signed)
? ?  [facility] CM Inpatient Consult ? ? ?10/21/2021 ? ?Katherine [name] ?04-23-1956 ?[id number] ? ?[facility] [ACO] Patient: Medicare ACO [place] ? ?PCP: [name], MD, [facility] ? ?Chart reviewed and reveals the patient is currently transitioning to Hospice/Palliative Care.  ? ?Given this choice patient will have full case management services through Hospice and needs will be met at the hospice level of care. ? ?Will sign off ? ?Natividad Brood, RN BSN CCM ?[facility] Liaison ? [phone] business mobile phone ?Toll free office [phone]  ?Fax number: [phone] ?[name] ?  ? ?. ? ?

## 2021-10-21 NOTE — Progress Notes (Signed)
?PROGRESS NOTE ? ? ? ?Katherine [name]  EPP:[id number] DOB: 01-07-1956 DOA: 10/18/2021 ?PCP: [name], MD  ? ? ?Brief Narrative:  ?Katherine [name] is a 66 y.o. female with history of atrial fibrillation, anxiety, coronary artery disease, COPD, depression, diabetes mellitus type 2, fibromyalgia, hypertension, peripheral vascular disease, history of COVID, coronary artery disease who was recently admitted hospital for non-ST elevation MI management  was discharged home on hospice but presented back to the hospital with worsening shortness of breath and palpitations.  In the ED, patient was noted to have atrial fibrillation with RVR and metoprolol was given including Lasix.  Given the persistent shortness of breath and heart rate being high, patient was admitted for further evaluation and treatment.  Patient also was noted to have a cellulitis on the back and was started on broad-spectrum antibiotics.  ? ? ?Assessment and Plan: ?Acute on chronic systolic CHF (congestive heart failure) ([place]) ?Patient had last ejection fraction of 55 to 60%.  Continue IV Lasix.  Continue intake and output charting, daily weights.  Continue ranolazine, Imdur.  5 pound weight loss documented in the computer.  Negative balance for 897 mL committed so far in the computer.  Was on 20 mg of IV Lasix and has been increased to 40 mg starting today.  On 3 L of oxygen by nasal cannula. ? ?CAD S/P percutaneous coronary angioplasty - PCI x 5 to SVG-D1 ?Continue Imdur, Ranexa, Eliquis. ? ?Atrial fibrillation with rapid ventricular response ([place]) ?Continue metoprolol 3 times daily.  Continue Eliquis.  Currently rate controlled. ? ?Hypokalemia and hypomagnesia  ?Mild.  We will continue with the oral magnesium and potassium supplement.  Check levels in a.m. ? ?Cellulitis of right buttock ?CT of the pelvis did not show any evidence of abscess.  Patient was on on vancomycin and cefepime.  Temp max of 99.4 ?F.  WBC at 5.7. Will deescalate  antibiotics to bactrim DS today.[name] Kitchen Spoke with pharmacy ? ?Type 2 diabetes mellitus with hyperlipidemia ([place]) ?With hyperglycemia.  On prednisone.  Latest hemoglobin A1c was 7.9, 2 months ago.  Continue Lantus and sliding scale insulin.  Latest POC glucose of 354.  Hemoglobin A1c at 7.8.  Insulin dose has been adjusted. ? ?Debility ?Deconditioning, functional decline with ambulatory dysfunction.  Patient is currently on hospice at home. ? ?Essential hypertension ?Continue Imdur, Lasix, metoprolol, ranolazine.  Blood pressure is borderline. ? ?Renal transplant disorder ?History of ESRD.  Continue azathioprine and prednisone. ? ? ? DVT prophylaxis:  ?apixaban (ELIQUIS) tablet 5 mg  ? ?Code Status:   ?  Code Status: DNR ? ?Disposition:  home hospice in 1 to 2 days.  Patient's brother is the caretaker for the patient. ? ?Status is: Inpatient ?Remains inpatient appropriate because: Pending clinical improvement. ? ? Family Communication: Spoke with the patient at bedside. ? ?Consultants:  ?Palliative care ? ?Procedures:  ?None ? ?Antimicrobials:  ? ?Bactrim Ds on 3/7> ?Vancomycin and cefepime- dced ? ?Anti-infectives (From admission, onward)  ? ? Start     Dose/Rate Route Frequency Ordered Stop  ? 10/21/21 1000  sulfamethoxazole-trimethoprim (BACTRIM DS) 800-160 MG per tablet 1 tablet       ? 1 tablet Oral Every 12 hours 10/21/21 0754    ? 10/20/21 0000  vancomycin (VANCOCIN) IVPB 1000 mg/200 mL premix  Status:  Discontinued       ? 1,000 mg ?200 mL/hr over 60 Minutes Intravenous Every 24 hours 10/19/21 1239 10/21/21 0754  ? 10/19/21 1200  vancomycin (VANCOCIN) IVPB  1000 mg/200 mL premix  Status:  Discontinued       ? 1,000 mg ?200 mL/hr over 60 Minutes Intravenous Every 12 hours 10/19/21 0255 10/19/21 1239  ? 10/19/21 0800  ceFEPIme (MAXIPIME) 2 g in sodium chloride 0.9 % 100 mL IVPB  Status:  Discontinued       ? 2 g ?200 mL/hr over 30 Minutes Intravenous Every 8 hours 10/19/21 0255 10/21/21 0754  ? 10/19/21 0100   vancomycin (VANCOREADY) IVPB 1750 mg/350 mL       ? 1,750 mg ?175 mL/hr over 120 Minutes Intravenous  Once 10/19/21 0004 10/19/21 0305  ? 10/19/21 0100  ceFEPIme (MAXIPIME) 2 g in sodium chloride 0.9 % 100 mL IVPB       ? 2 g ?200 mL/hr over 30 Minutes Intravenous  Once 10/19/21 0004 10/19/21 0103  ? ?  ? ?Subjective: ?Today, patient was seen and examined at bedside.  Overall feels better with breathing.  No chest pain shortness of breath.  Feels little sluggish compared to yesterday.   ? ?Objective: ?Vitals:  ? 10/20/21 2030 10/21/21 0010 10/21/21 0415 10/21/21 0740  ?BP:  (!) 110/59 98/65 112/67  ?Pulse: 87 82 83 84  ?Resp: '19 18 18 19  '$ ?Temp:  98.3 ?F (36.8 ?C) (!) 97.5 ?F (36.4 ?C) 99.4 ?F (37.4 ?C)  ?TempSrc:  Oral Oral Oral  ?SpO2: 96% 95% 91% 98%  ?Weight:   84.7 kg   ?Height:      ? ? ?Intake/Output Summary (Last 24 hours) at 10/21/2021 0928 ?Last data filed at 10/21/2021 0741 ?Gross per 24 hour  ?Intake 961.81 ml  ?Output 1250 ml  ?Net -288.19 ml  ? ?Filed Weights  ? 10/18/21 1741 10/20/21 0416 10/21/21 0415  ?Weight: 86.2 kg 84.2 kg 84.7 kg  ? ?Body mass index is 35.28 kg/m?.  ? ?Physical Examination: ? ?General: Obese built, not in obvious distress, chronically ill, deconditioned, on nasal cannula oxygen ?HENT:   No scleral pallor or icterus noted. Oral mucosa is moist.  ?Chest:  Clear breath sounds.  Diminished breath sounds bilaterally. No crackles or wheezes.  ?CVS: S1 &S2 heard. No murmur.  Regular rate and rhythm. ?Abdomen: Soft, nontender, nondistended.  Bowel sounds are heard.   ?Extremities: No cyanosis, clubbing or edema.  Peripheral pulses are palpable. ?Psych: Alert, awake and oriented, flat affect, ?CNS:  No cranial nerve deficits.  Generalized weakness noted ?Skin: Warm and dry.  Right buttocks with unstageable ulceration with some surrounding cellulitis..  Present on admission. ? ? ?Data Reviewed:  ? ?CBC: ?Recent Labs  ?Lab 10/18/21 ?1802 10/19/21 ?3790 10/20/21 ?2409 10/21/21 ?7353  ?WBC 6.2  6.7 4.2 5.7  ?NEUTROABS 4.3  --   --   --   ?HGB 10.7* 10.8* 11.0* 10.9*  ?HCT 34.8* 36.0 36.6 35.6*  ?MCV 85.7 84.9 84.7 85.2  ?PLT 330 414* 365 348  ? ? ?Basic Metabolic Panel: ?Recent Labs  ?Lab 10/18/21 ?1802 10/19/21 ?2992 10/21/21 ?4268  ?NA 134* 131* 132*  ?K 4.7 3.8 3.4*  ?CL 92* 90* 92*  ?CO2 '27 28 28  '$ ?GLUCOSE 303* 316* 305*  ?BUN '10 9 13  '$ ?CREATININE 0.88 0.91 0.99  ?CALCIUM 9.2 8.8* 9.5  ?MG  --   --  1.5*  ? ? ?Liver Function Tests: ?Recent Labs  ?Lab 10/18/21 ?1802  ?AST 57*  ?ALT 23  ?ALKPHOS 124  ?BILITOT 1.5*  ?PROT 6.9  ?ALBUMIN 2.9*  ? ? ? ?Radiology Studies: ?No results found. ? ? ? LOS:  3 days  ? ? ?Flora Lipps, MD ?[facility] ?10/21/2021, 9:28 AM  ?  ?

## 2021-10-21 NOTE — Progress Notes (Addendum)
Pt is tearful about her prognosis, states that is too thirsty to adhere to fluid restrictions. Called palliative care to help support her wishes.  ?

## 2021-10-21 NOTE — TOC Progression Note (Addendum)
Transition of Care (TOC) - Progression Note  ? ? ?Patient Details  ?Name: Katherine [name] ?MRN: [id number] ?Date of Birth: 1955-12-03 ? ?Transition of Care (TOC) CM/SW Contact  ?Zenon Mayo, RN ?Phone Number: ?10/21/2021, 3:02 PM ? ?Clinical Narrative:    ?NCM contacted [name] at [facility], she states they discharged patient on Sunday, because she keeps calling 911 and coming to the ER.  They have told her she can't do that.  This NCM spoke with patient at bedside and she states she would like to continue with [facility].   NCM informed [name] of this information, she states she will have to speak with her director and call this NCM back. Patient states MD said she will be dc tomorrow.  She states she will need ambulance transport, address confirmed. She is on oxygen at home. Per [name] she states they will have to readmit her, she will need MD to put in discharge summary to readmit to hospice.  They will send a Nurse out to do face to face with her at home in the am if she is dc in the morning. Needs ambulance transport. ? ? ?  ?  ? ?Expected Discharge Plan and Services ?  ?  ?  ?  ?  ?                ?  ?  ?  ?  ?  ?  ?  ?  ?  ?  ? ? ?Social Determinants of Health (SDOH) Interventions ?  ? ?Readmission Risk Interventions ?Readmission Risk Prevention Plan 09/23/2021 09/08/2021 05/14/2021  ?Transportation Screening Complete Complete Complete  ?[place] or Home Care Consult - - -  ?Social Work Consult for [place] Planning/Counseling - - -  ?Palliative Care Screening - - -  ?Medication Review Press photographer) Complete Complete Complete  ?PCP or Specialist appointment within 3-5 days of discharge Complete Complete Complete  ?[place] or Home Care Consult Complete Complete Complete  ?SW Recovery Care/Counseling Consult Complete Complete Complete  ?Palliative Care Screening Complete Not Applicable Not Applicable  ?[place] Not Applicable Not Applicable Not Applicable  ?Some recent  data might be hidden  ? ? ?

## 2021-10-21 NOTE — Progress Notes (Signed)
This chaplain navigated phone call from Pt. RN-Melva to PMT NP-Kathryn. The chaplain understands NP-Kathryn will update the  Pt. RN today. ? ?Chaplain Sallyanne Kuster ?[phone] ?

## 2021-10-21 NOTE — Discharge Summary (Addendum)
Physician Discharge Summary   Patient: Katherine [name] MRN: [id number] DOB: 10-01-1955  Admit date:     10/18/2021  Discharge date: 10/22/21  Discharge Physician: Corrie Mckusick Malillany Kazlauskas   PCP: [name], MD   Recommendations at discharge:   Follow-up with your hospice care provider after discharge.  Discharge Diagnoses: Active Problems:   Acute on chronic systolic CHF (congestive heart failure) (HCC)   CAD S/P percutaneous coronary angioplasty - PCI x 5 to SVG-D1   Atrial fibrillation with rapid ventricular response (HCC)   Hypokalemia and hypomagnesia    Renal transplant disorder   Essential hypertension   Debility   Type 2 diabetes mellitus with hyperlipidemia (HCC)   Cellulitis of right buttock  Resolved Problems:   * No resolved hospital problems. *  Hospital Course: Katherine [name] is a 66 y.o. female with history of atrial fibrillation, anxiety, coronary artery disease, COPD, depression, diabetes mellitus type 2, fibromyalgia, hypertension, peripheral vascular disease, history of COVID, coronary artery disease who was recently admitted hospital for non-ST elevation MI management  was discharged home on hospice but presented back to the hospital with worsening shortness of breath and palpitations.  In the ED, patient was noted to have atrial fibrillation with RVR and metoprolol was given including Lasix.  Given the persistent shortness of breath and heart rate being high, patient was admitted for further evaluation and treatment.  Patient also was noted to have a cellulitis on the back and was started on broad-spectrum antibiotics.  Assessment and Plan: Acute on chronic systolic CHF (congestive heart failure) ([place]) Patient had last ejection fraction of 55 to 60%.  Received IV Lasix.  Continue intake and output charting, daily weights.  Continue ranolazine, Imdur.  5 pound weight loss documented in the computer.  Negative balance for 897 mL committed so far in the computer.  Was  on 20 mg of IV Lasix and and was changed to 40 mg.  Patient will resume oral Lasix on discharge.  On 3 L of oxygen by nasal cannula.  CAD S/P percutaneous coronary angioplasty - PCI x 5 to SVG-D1 Continue Imdur, Ranexa, Eliquis.  Atrial fibrillation with rapid ventricular response (HCC) Continue metoprolol 3 times daily.  Continue Eliquis.  Currently rate controlled.  Hypokalemia and hypomagnesia  Mild.   Replenished.  Cellulitis of right buttock CT of the pelvis did not show any evidence of abscess.  Patient was on on vancomycin and cefepime.  Temp max of 99.4 F.  WBC at 5.7 we will de-escalate antibiotic to Bactrim DS on discharge.  Type 2 diabetes mellitus with hyperlipidemia (HCC) With hyperglycemia.  On prednisone.  Latest hemoglobin A1c was 7.9, 2 months ago.  Continue insulin regimen from home.  Debility Deconditioning, functional decline with ambulatory dysfunction.  Patient is currently on hospice at home.  Essential hypertension Continue Imdur, Lasix, metoprolol, ranolazine.  Blood pressure is borderline.  Renal transplant disorder History of ESRD.  Continue azathioprine and prednisone.  Consultants: Palliative care Procedures performed: Palliative care Disposition: Home with home health Diet recommendation:  Discharge Diet Orders (From admission, onward)     Start     Ordered   10/21/21 0000  Diet - low sodium heart healthy       Comments: Fluid restriction 1500 MLS per day.   10/21/21 1554           Cardiac diet DISCHARGE MEDICATION: Allergies as of 10/22/2021       Reactions   Tetracycline Hives   Patient tolerated Doxycycline  Dec 2020   Niacin Other (See Comments)   Mouth blisters   Niaspan [niacin Er] Other (See Comments)   Mouth blisters   Sulfa Antibiotics Nausea Only, Other (See Comments)   "Tears up stomach"   Sulfonamide Derivatives Other (See Comments)   Reaction: per patient "tears her stomach up"   Codeine Nausea And Vomiting    Erythromycin Nausea And Vomiting   Hydromorphone Hcl Nausea And Vomiting   Morphine And Related Nausea And Vomiting   Nalbuphine Nausea And Vomiting   Nubain   Sulfasalazine Nausea Only, Other (See Comments)   per patient "tears her stomach up", "Tears up stomach"   Tape Rash, Other (See Comments)   No "plastic" tape," please----cloth tape only        Medication List     TAKE these medications    albuterol 108 (90 Base) MCG/ACT inhaler Commonly known as: VENTOLIN HFA TAKE 2 PUFFS BY MOUTH EVERY 6 HOURS AS NEEDED FOR WHEEZE OR SHORTNESS OF BREATH What changed: See the new instructions.   apixaban 5 MG Tabs tablet Commonly known as: ELIQUIS Take 1 tablet (5 mg total) by mouth 2 (two) times daily.   arformoterol 15 MCG/2ML Nebu Commonly known as: BROVANA Take 2 mLs (15 mcg total) by nebulization 2 (two) times daily.   azaTHIOprine 50 MG tablet Commonly known as: IMURAN Take 2 & 1/2 tablets (125 mg) by mouth daily at 3pm What changed: additional instructions   Basaglar KwikPen 100 UNIT/ML Inject 20 Units into the skin daily.   bisacodyl 5 MG EC tablet Commonly known as: DULCOLAX Take 1 tablet (5 mg total) by mouth daily as needed for moderate constipation.   budesonide 0.5 MG/2ML nebulizer solution Commonly known as: PULMICORT Take 2 mLs (0.5 mg total) by nebulization 2 (two) times daily.   clopidogrel 75 MG tablet Commonly known as: PLAVIX Take 75 mg by mouth daily.   cyclobenzaprine 10 MG tablet Commonly known as: FLEXERIL Take 1 tablet (10 mg total) by mouth 3 (three) times daily as needed for muscle spasms.   docusate sodium 100 MG capsule Commonly known as: COLACE Take 1 capsule (100 mg total) by mouth 2 (two) times daily.   fluticasone 50 MCG/ACT nasal spray Commonly known as: FLONASE PLACE 2 SPRAYS INTO BOTH NOSTRILS 2 TIMES DAILY. What changed: See the new instructions.   furosemide 40 MG tablet Commonly known as: LASIX Take 40 mg by mouth  daily.   gabapentin 100 MG capsule Commonly known as: NEURONTIN Take 1 capsule (100 mg total) by mouth at bedtime.   isosorbide mononitrate 60 MG 24 hr tablet Commonly known as: IMDUR Take 1 tablet (60 mg total) by mouth daily.   lamoTRIgine 25 MG tablet Commonly known as: LAMICTAL TAKE 1 TABLET BY MOUTH EVERYDAY AT BEDTIME What changed: See the new instructions.   LORazepam 1 MG tablet Commonly known as: ATIVAN Take 1 mg by mouth 2 (two) times daily as needed for anxiety.   LORazepam 2 MG/ML concentrated solution Commonly known as: ATIVAN Take 0.5 mLs (1 mg total) by mouth 3 (three) times daily.   LORazepam 2 MG/ML concentrated solution Commonly known as: ATIVAN Place 0.5 mLs (1 mg total) under the tongue every hour as needed for anxiety, seizure, sedation or sleep (distress).   magnesium oxide 400 MG tablet Commonly known as: MAG-OX Take 1 tablet (400 mg total) by mouth 2 (two) times daily for 5 days.   metoprolol tartrate 25 MG tablet Commonly known as: LOPRESSOR Take 1  tablet (25 mg total) by mouth 3 (three) times daily.   montelukast 10 MG tablet Commonly known as: SINGULAIR TAKE 1 TABLET BY MOUTH EVERYDAY AT BEDTIME   morphine CONCENTRATE 10 MG/0.5ML Soln concentrated solution Take 0.5 mLs (10 mg total) by mouth every 6 (six) hours. What changed:  how much to take when to take this reasons to take this   morphine CONCENTRATE 10 MG/0.5ML Soln concentrated solution Take 0.25-0.5 mLs (5-10 mg total) by mouth every 2 (two) hours as needed for severe pain, anxiety or shortness of breath (distress). What changed: Another medication with the same name was changed. Make sure you understand how and when to take each.   nitroGLYCERIN 0.4 MG SL tablet Commonly known as: NITROSTAT Place 0.4 mg under the tongue every 5 (five) minutes as needed for chest pain.   NovoLOG FlexPen 100 UNIT/ML FlexPen Generic drug: insulin aspart Inject 4-10 Units into the skin 3 (three)  times daily with meals. Sliding scale   OLANZapine 5 MG tablet Commonly known as: ZYPREXA Take 5 mg by mouth at bedtime.   ondansetron 4 MG disintegrating tablet Commonly known as: ZOFRAN-ODT Take 1 tablet (4 mg total) by mouth every 8 (eight) hours.   Oxycodone HCl 10 MG Tabs Take 10 mg by mouth 4 (four) times daily as needed (pain).   OXYGEN Inhale 4 L into the lungs continuous.   pantoprazole 40 MG tablet Commonly known as: PROTONIX TAKE 1 TABLET BY MOUTH EVERY DAY What changed:  when to take this reasons to take this   phenazopyridine 200 MG tablet Commonly known as: PYRIDIUM Take 200 mg by mouth 3 (three) times daily as needed for pain.   polyethylene glycol 17 g packet Commonly known as: MIRALAX / GLYCOLAX Take 17 g by mouth daily as needed for mild constipation.   predniSONE 5 MG tablet Commonly known as: DELTASONE Take 5 mg by mouth daily with breakfast.   promethazine 25 MG tablet Commonly known as: PHENERGAN Take 25 mg by mouth every 6 (six) hours as needed for nausea or vomiting.   ranolazine 500 MG 12 hr tablet Commonly known as: Ranexa Take 1 tablet (500 mg total) by mouth 2 (two) times daily.   senna 8.6 MG Tabs tablet Commonly known as: SENOKOT Take 8.6 mg by mouth daily as needed for mild constipation.   sertraline 100 MG tablet Commonly known as: ZOLOFT Take 1 tablet (100 mg total) by mouth daily. What changed: when to take this   sulfamethoxazole-trimethoprim 800-160 MG tablet Commonly known as: BACTRIM DS Take 1 tablet by mouth every 12 (twelve) hours for 5 days.               Discharge Care Instructions  (From admission, onward)           Start     Ordered   10/21/21 0000  Discharge wound care:       Comments: Right buttocks wound: Cleanse with NS< pat gently dry. Cover with size appropriate piece of xeroform gauze (folded), Lawson # [id number]), top with dry gauze and cover with silicone foam dressing. Change xeroform daily, may  reused foram for up to 3 days. Change PRN soiling.   10/21/21 1554           Physical Examination:   General: Obese built, not in obvious distress, chronically ill, deconditioned, on nasal cannula oxygen HENT:   No scleral pallor or icterus noted. Oral mucosa is moist.  Chest:  Clear breath sounds.  Diminished  breath sounds bilaterally. No crackles or wheezes.  CVS: S1 &S2 heard. No murmur.  Regular rate and rhythm. Abdomen: Soft, nontender, nondistended.  Bowel sounds are heard.   Extremities: No cyanosis, clubbing or edema.  Peripheral pulses are palpable. Psych: Alert, awake and oriented, flat affect, CNS:  No cranial nerve deficits.  Generalized weakness noted Skin: Warm and dry.  Right buttocks with unstageable ulceration with some surrounding cellulitis..  Present on admission.    Discharge Exam: Filed Weights   10/20/21 0416 10/21/21 0415 10/22/21 0440  Weight: 84.2 kg 84.7 kg 85.5 kg   Vitals with BMI 10/22/2021 10/22/2021 10/22/2021  Height - - -  Weight - 188 lbs 8 oz -  BMI - 87.86 -  Systolic 767 209 470  Diastolic 56 94 58  Pulse 77 81 76     Condition at discharge: stable  The results of significant diagnostics from this hospitalization (including imaging, microbiology, ancillary and laboratory) are listed below for reference.   Imaging Studies: CT PELVIS WO CONTRAST  Result Date: 10/19/2021 CLINICAL DATA:  66 year old female with possible right buttock infection. EXAM: CT PELVIS WITHOUT CONTRAST TECHNIQUE: Multidetector CT imaging of the pelvis was performed following the standard protocol without intravenous contrast. RADIATION DOSE REDUCTION: This exam was performed according to the departmental dose-optimization program which includes automated exposure control, adjustment of the mA and/or kV according to patient size and/or use of iterative reconstruction technique. COMPARISON:  CT Abdomen and Pelvis 04/19/2021. FINDINGS: Urinary Tract: Native renal atrophy not  included on today's images. Left pelvic renal transplant without hydronephrosis. No hydroureter. Moderately distended but otherwise unremarkable urinary bladder. Bowel: No dilated large or small bowel. Some retained stool in the colon. Partially visible left abdominal gastric band catheter. Vascular/Lymphatic: Severe aortic and bilateral iliofemoral calcified atherosclerosis. Vascular patency is not evaluated in the absence of IV contrast. No lymphadenopathy identified. Reproductive:  Diminutive or absent as before. Other: No pelvic free fluid. Regressed but not completely resolved right lower abdominal wall subcutaneous inflammation or hematoma since last year, mild residual stranding on series 3, image 16. Musculoskeletal: No convincing perineum or asymmetric buttock inflammation. No soft tissue gas or fluid collection. No acute osseous abnormality identified. IMPRESSION: 1. No convincing buttock cellulitis or acute inflammatory process. No soft tissue gas or fluid collection. 2. Severe Aortic Atherosclerosis (ICD10-I70.0), and iliofemoral atherosclerosis. Cannot exclude iliac or femoral artery occlusion due to plaque. 3. Left pelvic renal transplant. Electronically Signed   By: Genevie Ann M.D.   On: 10/19/2021 07:47   DG Chest Portable 1 View  Result Date: 10/18/2021 CLINICAL DATA:  Worsening shortness of breath today. EXAM: PORTABLE CHEST 1 VIEW COMPARISON:  Radiograph 10/03/2021, CT 08/05/2021 FINDINGS: Significant patient rotation. Post median sternotomy and CABG. Cardiomegaly stable. There are persistent bilateral heterogeneous pulmonary opacities with slight improvement from prior exam. No new or acute airspace disease. No pleural effusion or pneumothorax. No acute osseous findings. IMPRESSION: 1. Persistent bilateral heterogeneous pulmonary opacities with slight improvement from prior exam. This may represent multifocal pneumonia, scarring, or pulmonary edema. 2. Stable cardiomegaly. Electronically Signed    By: Keith Rake M.D.   On: 10/18/2021 18:24   DG Chest Port 1 View  Result Date: 10/03/2021 CLINICAL DATA:  Short of breath. EXAM: PORTABLE CHEST 1 VIEW COMPARISON:  09/23/2021 FINDINGS: Previous median sternotomy and CABG procedure. Stable cardiomediastinal contours. Interval increase in bilateral interstitial and airspace opacities. No acute osseous findings. IMPRESSION: Worsening aeration to the lungs bilaterally compatible with progressive pulmonary edema versus  multifocal infection. Electronically Signed   By: Kerby Moors M.D.   On: 10/03/2021 05:52   DG CHEST PORT 1 VIEW  Result Date: 09/23/2021 CLINICAL DATA:  Shortness of breath EXAM: PORTABLE CHEST 1 VIEW COMPARISON:  Previous studies including the examination of 09/20/2021 FINDINGS: Transverse diameter of heart is increased. There is interval decrease in pulmonary vascular congestion. There is interval decrease in interstitial markings in both lungs and improvement in aeration of both lungs. There is residual prominence of interstitial markings in the right parahilar region and both lower lung fields. There is no focal pulmonary consolidation. There is previous coronary bypass surgery. There is blunting of both lateral CP angles suggesting small effusions. There is no pneumothorax. IMPRESSION: Cardiomegaly. There is interval decrease in pulmonary vascular congestion and pulmonary edema. There is residual prominence of interstitial markings in the right parahilar region and both lower lung fields suggesting residual pulmonary edema or interstitial pneumonia. No new focal infiltrates are seen. Electronically Signed   By: Elmer Picker M.D.   On: 09/23/2021 13:13    Microbiology: Results for orders placed or performed during the hospital encounter of 10/18/21  Resp Panel by RT-PCR (Flu A&B, Covid) Nasopharyngeal Swab     Status: None   Collection Time: 10/18/21  9:09 PM   Specimen: Nasopharyngeal Swab; Nasopharyngeal(NP) swabs in  vial transport medium  Result Value Ref Range Status   SARS Coronavirus 2 by RT PCR NEGATIVE NEGATIVE Final    Comment: (NOTE) SARS-CoV-2 target nucleic acids are NOT DETECTED.  The SARS-CoV-2 RNA is generally detectable in upper respiratory specimens during the acute phase of infection. The lowest concentration of SARS-CoV-2 viral copies this assay can detect is 138 copies/mL. A negative result does not preclude SARS-Cov-2 infection and should not be used as the sole basis for treatment or other patient management decisions. A negative result may occur with  improper specimen collection/handling, submission of specimen other than nasopharyngeal swab, presence of viral mutation(s) within the areas targeted by this assay, and inadequate number of viral copies(<138 copies/mL). A negative result must be combined with clinical observations, patient history, and epidemiological information. The expected result is Negative.  Fact Sheet for Patients:  EntrepreneurPulse.com.au  Fact Sheet for Healthcare Providers:  IncredibleEmployment.be  This test is no t yet approved or cleared by the [place] FDA and  has been authorized for detection and/or diagnosis of SARS-CoV-2 by FDA under an Emergency Use Authorization (EUA). This EUA will remain  in effect (meaning this test can be used) for the duration of the COVID-19 declaration under Section 564(b)(1) of the Act, 21 U.S.C.section 360bbb-3(b)(1), unless the authorization is terminated  or revoked sooner.       Influenza A by PCR NEGATIVE NEGATIVE Final   Influenza B by PCR NEGATIVE NEGATIVE Final    Comment: (NOTE) The Xpert Xpress SARS-CoV-2/FLU/RSV plus assay is intended as an aid in the diagnosis of influenza from Nasopharyngeal swab specimens and should not be used as a sole basis for treatment. Nasal washings and aspirates are unacceptable for Xpert Xpress SARS-CoV-2/FLU/RSV testing.  Fact  Sheet for Patients: EntrepreneurPulse.com.au  Fact Sheet for Healthcare Providers: IncredibleEmployment.be  This test is not yet approved or cleared by the [place] FDA and has been authorized for detection and/or diagnosis of SARS-CoV-2 by FDA under an Emergency Use Authorization (EUA). This EUA will remain in effect (meaning this test can be used) for the duration of the COVID-19 declaration under Section 564(b)(1) of the Act, 21 U.S.C. section  360bbb-3(b)(1), unless the authorization is terminated or revoked.  Performed at [facility], [place] [address] [place], [place] [address]   MRSA Next Gen by PCR, Nasal     Status: None   Collection Time: 10/20/21 10:09 AM   Specimen: Nasal Mucosa; Nasal Swab  Result Value Ref Range Status   MRSA by PCR Next Gen NOT DETECTED NOT DETECTED Final    Comment: (NOTE) The GeneXpert MRSA Assay (FDA approved for NASAL specimens only), is one component of a comprehensive MRSA colonization surveillance program. It is not intended to diagnose MRSA infection nor to guide or monitor treatment for MRSA infections. Test performance is not FDA approved in patients less than 75 years old. Performed at [facility], [place] [address], [place], [place] [address]    *Note: Due to a large number of results and/or encounters for the requested time period, some results have not been displayed. A complete set of results can be found in Results Review.    Labs: CBC: Recent Labs  Lab 10/18/21 1802 10/19/21 0217 10/20/21 0437 10/21/21 0338 10/22/21 0345  WBC 6.2 6.7 4.2 5.7 6.2  NEUTROABS 4.3  --   --   --   --   HGB 10.7* 10.8* 11.0* 10.9* 10.5*  HCT 34.8* 36.0 36.6 35.6* 33.3*  MCV 85.7 84.9 84.7 85.2 84.7  PLT 330 414* 365 348 003   Basic Metabolic Panel: Recent Labs  Lab 10/18/21 1802 10/19/21 0217 10/21/21 0338 10/22/21 0345  NA 134* 131* 132* 133*  K 4.7 3.8 3.4* 5.7*  CL 92* 90* 92* 95*   CO2 '27 28 28 26  '$ GLUCOSE 303* 316* 305* 220*  BUN '10 9 13 15  '$ CREATININE 0.88 0.91 0.99 0.89  CALCIUM 9.2 8.8* 9.5 9.5  MG  --   --  1.5* 1.6*   Liver Function Tests: Recent Labs  Lab 10/18/21 1802  AST 57*  ALT 23  ALKPHOS 124  BILITOT 1.5*  PROT 6.9  ALBUMIN 2.9*   CBG: Recent Labs  Lab 10/21/21 0624 10/21/21 1116 10/21/21 1659 10/21/21 2135 10/22/21 0637  GLUCAP 286* 338* 215* 273* 200*    Discharge time spent: greater than 30 minutes.  Signed: Flora Lipps, MD [facility] 10/22/2021

## 2021-10-22 ENCOUNTER — Other Ambulatory Visit (HOSPITAL_COMMUNITY): Payer: Self-pay

## 2021-10-22 LAB — BASIC METABOLIC PANEL
Anion gap: 12 (ref 5–15)
Anion gap: 10 (ref 5–15)
BUN: 15 mg/dL (ref 8–23)
BUN: 15 mg/dL (ref 8–23)
CO2: 26 mmol/L (ref 22–32)
CO2: 27 mmol/L (ref 22–32)
Calcium: 9.5 mg/dL (ref 8.9–10.3)
Calcium: 9.5 mg/dL (ref 8.9–10.3)
Chloride: 95 mmol/L — ABNORMAL LOW (ref 98–111)
Chloride: 93 mmol/L — ABNORMAL LOW (ref 98–111)
Creatinine, Ser: 0.89 mg/dL (ref 0.44–1.00)
Creatinine, Ser: 1.09 mg/dL — ABNORMAL HIGH (ref 0.44–1.00)
GFR, Estimated: >60 mL/min (ref >60)
GFR, Estimated: 57 mL/min — ABNORMAL LOW (ref >60)
Glucose, Bld: 220 mg/dL — ABNORMAL HIGH (ref 70–99)
Glucose, Bld: 490 mg/dL — ABNORMAL HIGH (ref 70–99)
Potassium: 5.7 mmol/L — ABNORMAL HIGH (ref 3.5–5.1)
Potassium: 5 mmol/L (ref 3.5–5.1)
Sodium: 133 mmol/L — ABNORMAL LOW (ref 135–145)
Sodium: 130 mmol/L — ABNORMAL LOW (ref 135–145)

## 2021-10-22 LAB — CBC
HCT: 33.3 % — ABNORMAL LOW (ref 36.0–46.0)
Hemoglobin: 10.5 g/dL — ABNORMAL LOW (ref 12.0–15.0)
MCH: 26.7 pg (ref 26.0–34.0)
MCHC: 31.5 g/dL (ref 30.0–36.0)
MCV: 84.7 fL (ref 80.0–100.0)
Platelets: 364 10*3/uL (ref 150–400)
RBC: 3.93 MIL/uL (ref 3.87–5.11)
RDW: 18.4 % — ABNORMAL HIGH (ref 11.5–15.5)
WBC: 6.2 10*3/uL (ref 4.0–10.5)
nRBC: 0.3 % — ABNORMAL HIGH (ref 0.0–0.2)

## 2021-10-22 LAB — GLUCOSE, CAPILLARY
Glucose-Capillary: 200 mg/dL — ABNORMAL HIGH (ref 70–99)
Glucose-Capillary: 515 mg/dL (ref 70–99)
Glucose-Capillary: 455 mg/dL — ABNORMAL HIGH (ref 70–99)

## 2021-10-22 LAB — MAGNESIUM: Magnesium: 1.6 mg/dL — ABNORMAL LOW (ref 1.7–2.4)

## 2021-10-22 MED ORDER — INSULIN ASPART 100 UNIT/ML IJ SOLN
22.0000 [IU] | Freq: Once | INTRAMUSCULAR | Status: AC
  Administered 2021-10-22: 22 [IU] via SUBCUTANEOUS

## 2021-10-22 MED ORDER — INSULIN ASPART 100 UNIT/ML IJ SOLN
18.0000 [IU] | Freq: Once | INTRAMUSCULAR | Status: AC
  Administered 2021-10-22: 18 [IU] via SUBCUTANEOUS

## 2021-10-22 NOTE — Discharge Instructions (Signed)

## 2021-10-22 NOTE — Care Management Important Message (Signed)
Important Message ? ?Patient Details  ?Name: Katherine [name] ?MRN: [id number] ?Date of Birth: [birth year] ? ? ?Medicare Important Message Given:  Yes ? ? ? ? ?[name] ?10/22/2021, 9:39 AM ?

## 2021-10-22 NOTE — Progress Notes (Signed)
Meds delivered voiding on bsc prior to EMS ride home. ?

## 2021-10-22 NOTE — Significant Event (Signed)
Latest Reference Range & Units 10/22/21 11:26  ?Glucose-Capillary 70 - 99 mg/dL 515 (HH)  ?(HH): Data is critically high ? ?MD made aware waiting on orders to cover patient. ?

## 2021-10-22 NOTE — TOC Transition Note (Signed)
Transition of Care (TOC) - CM/SW Discharge Note ? ? ?Patient Details  ?Name: Katherine [name] ?MRN: [id number] ?Date of Birth: 05-26-1956 ? ?Transition of Care (TOC) CM/SW Contact:  ?Zenon Mayo, RN ?Phone Number: ?10/22/2021, 8:50 AM ? ? ?Clinical Narrative:    ?Patient is for dc today, home with hospice, patient states to set up transport for 1:30 , because her brother has a dentist apt this morning and will not be back til then.  NCM informed Staff RN and scheduled PTAR for 1:30 transport.  NCM notified [name] with [facility] of the 1:30 transport as well. ? ? ?Final next level of care: Advance ?Barriers to Discharge: No Barriers Identified ? ? ?Patient Goals and CMS Choice ?Patient states their goals for this hospitalization and ongoing recovery are:: return home with hospice ?CMS Medicare.gov Compare Post Acute Care list provided to:: Patient ?Choice offered to / list presented to : Patient ? ?Discharge Placement ?  ?           ?  ?  ?  ?  ? ?Discharge Plan and Services ?  ?  ?           ?  ?DME Agency: NA ?  ?  ?  ?HH Arranged: NA ?  ?  ?  ?  ? ?Social Determinants of Health (SDOH) Interventions ?  ? ? ?Readmission Risk Interventions ?Readmission Risk Prevention Plan 09/23/2021 09/08/2021 05/14/2021  ?Transportation Screening Complete Complete Complete  ?[place] or Home Care Consult - - -  ?Social Work Consult for [place] Planning/Counseling - - -  ?Palliative Care Screening - - -  ?Medication Review Press photographer) Complete Complete Complete  ?PCP or Specialist appointment within 3-5 days of discharge Complete Complete Complete  ?[place] or Home Care Consult Complete Complete Complete  ?SW Recovery Care/Counseling Consult Complete Complete Complete  ?Palliative Care Screening Complete Not Applicable Not Applicable  ?[place] Not Applicable Not Applicable Not Applicable  ?Some recent data might be hidden  ? ? ? ? ? ?

## 2021-10-22 NOTE — Progress Notes (Signed)
Patient seen and examined at bedside.  No interval changes noted since yesterday.  Please refer to discharge instructions on 10/21/2021.  Vitals reviewed and stable.  Patient is stable for discharge home with hospice.  Spoke with the patient's niece on the phone about disposition today. ?

## 2021-10-22 NOTE — Significant Event (Signed)
Patient asked for cranberry juice with medication. Then asked for more Cranberry juice. Became upset when I gave teaching on fluid restriction. Stated that when she getting home she will have all the Coca cola she wants. ?

## 2021-10-22 NOTE — Plan of Care (Signed)
  Problem: Education: Goal: Knowledge of General Education information will improve Description Including pain rating scale, medication(s)/side effects and non-pharmacologic comfort measures Outcome: Adequate for Discharge   Problem: Health Behavior/Discharge Planning: Goal: Ability to manage health-related needs will improve Outcome: Adequate for Discharge   Problem: Clinical Measurements: Goal: Ability to maintain clinical measurements within normal limits will improve Outcome: Adequate for Discharge Goal: Will remain free from infection Outcome: Adequate for Discharge Goal: Diagnostic test results will improve Outcome: Adequate for Discharge Goal: Respiratory complications will improve Outcome: Adequate for Discharge Goal: Cardiovascular complication will be avoided Outcome: Adequate for Discharge   Problem: Activity: Goal: Risk for activity intolerance will decrease Outcome: Adequate for Discharge   Problem: Nutrition: Goal: Adequate nutrition will be maintained Outcome: Adequate for Discharge   Problem: Coping: Goal: Level of anxiety will decrease Outcome: Adequate for Discharge   Problem: Elimination: Goal: Will not experience complications related to bowel motility Outcome: Adequate for Discharge Goal: Will not experience complications related to urinary retention Outcome: Adequate for Discharge   Problem: Pain Managment: Goal: General experience of comfort will improve Outcome: Adequate for Discharge   Problem: Safety: Goal: Ability to remain free from injury will improve Outcome: Adequate for Discharge   Problem: Skin Integrity: Goal: Risk for impaired skin integrity will decrease Outcome: Adequate for Discharge   Problem: Education: Goal: Knowledge of disease or condition will improve Outcome: Adequate for Discharge Goal: Understanding of medication regimen will improve Outcome: Adequate for Discharge Goal: Individualized Educational  Video(s) Outcome: Adequate for Discharge   Problem: Activity: Goal: Ability to tolerate increased activity will improve Outcome: Adequate for Discharge   Problem: Cardiac: Goal: Ability to achieve and maintain adequate cardiopulmonary perfusion will improve Outcome: Adequate for Discharge   Problem: Health Behavior/Discharge Planning: Goal: Ability to safely manage health-related needs after discharge will improve Outcome: Adequate for Discharge   

## 2021-10-22 NOTE — Significant Event (Signed)
Latest Reference Range & Units 10/22/21 11:34  ?BASIC METABOLIC PANEL  Rpt !  ?Sodium 135 - 145 mmol/L 130 (L)  ?Potassium 3.5 - 5.1 mmol/L 5.0  ?Chloride 98 - 111 mmol/L 93 (L)  ?CO2 22 - 32 mmol/L 27  ?Glucose 70 - 99 mg/dL 490 (H)  ?BUN 8 - 23 mg/dL 15  ?Creatinine 0.44 - 1.00 mg/dL 1.09 (H)  ?Calcium 8.9 - 10.3 mg/dL 9.5  ?Anion gap 5 - 15  10  ?GFR, Estimated >60 mL/min 57 (L)  ?!: Data is abnormal ?(L): Data is abnormally low ?(H): Data is abnormally high ?Rpt: View report in Results Review for more information ?CBG 455 MD updated.  ?

## 2021-10-23 ENCOUNTER — Telehealth: Payer: Self-pay

## 2021-10-23 NOTE — Chronic Care Management (AMB) (Signed)
? ? ?Chronic Care Management ?Pharmacy Assistant  ? ?Name: Katherine [name]  MRN: [id number] DOB: 1955-10-19 ? ?Katherine [name] is an 66 y.o. year old female who presents for his follow-up CCM visit with the clinical pharmacist. ? ?Reason for Encounter: Disease StateGeneral ?  ? ?Recent office visits:  ?None ID ? ?Recent consult visits:  ?None ID ? ?Hospital visits:  ?Medication Reconciliation was completed by comparing discharge summary, patient?s EMR and Pharmacy list, and upon discussion with patient. ? ?Admitted to the hospital on 10/18/21 due to CHF. Discharge date was 10/22/21. Discharged from [facility]   ? ?Admitted to the hospital on 10/03/21 due to acute coronary syndrome. Discharge date was 10/05/21. Discharged from [facility].   ? ?Admitted to the hospital on 09/20/21 due to acute coronary syndrome. Discharge date was 09/26/21. Discharged from [facility].   ? ?Medications that remain the same after Hospital Discharge:??  ?-All other medications will remain the same.   ? ?Medications: ?Outpatient Encounter Medications as of 10/23/2021  ?Medication Sig  ? albuterol (VENTOLIN HFA) 108 (90 Base) MCG/ACT inhaler TAKE 2 PUFFS BY MOUTH EVERY 6 HOURS AS NEEDED FOR WHEEZE OR SHORTNESS OF BREATH (Patient taking differently: 2 puffs every 6 (six) hours as needed for wheezing or shortness of breath.)  ? apixaban (ELIQUIS) 5 MG TABS tablet Take 1 tablet (5 mg total) by mouth 2 (two) times daily.  ? arformoterol (BROVANA) 15 MCG/2ML NEBU Take 2 mLs (15 mcg total) by nebulization 2 (two) times daily.  ? azaTHIOprine (IMURAN) 50 MG tablet Take 2 & 1/2 tablets (125 mg) by mouth daily at 3pm  ? bisacodyl (DULCOLAX) 5 MG EC tablet Take 1 tablet (5 mg total) by mouth daily as needed for moderate constipation. (Patient not taking: Reported on 10/19/2021)  ? budesonide (PULMICORT) 0.5 MG/2ML nebulizer solution Take 2 mLs (0.5 mg total) by nebulization 2 (two) times daily.  ?  clopidogrel (PLAVIX) 75 MG tablet Take 75 mg by mouth daily.  ? cyclobenzaprine (FLEXERIL) 10 MG tablet Take 1 tablet (10 mg total) by mouth 3 (three) times daily as needed for muscle spasms.  ? docusate sodium (COLACE) 100 MG capsule Take 1 capsule (100 mg total) by mouth 2 (two) times daily. (Patient not taking: Reported on 10/19/2021)  ? fluticasone (FLONASE) 50 MCG/ACT nasal spray PLACE 2 SPRAYS INTO BOTH NOSTRILS 2 TIMES DAILY. (Patient taking differently: 2 sprays in the morning and at bedtime.)  ? furosemide (LASIX) 40 MG tablet Take 40 mg by mouth daily.  ? gabapentin (NEURONTIN) 100 MG capsule Take 1 capsule (100 mg total) by mouth at bedtime.  ? insulin aspart (NOVOLOG FLEXPEN) 100 UNIT/ML FlexPen Inject 4-10 Units into the skin 3 (three) times daily with meals. Sliding scale  ? Insulin Glargine (BASAGLAR KWIKPEN) 100 UNIT/ML Inject 20 Units into the skin daily.  ? isosorbide mononitrate (IMDUR) 60 MG 24 hr tablet Take 1 tablet (60 mg total) by mouth daily. (Patient not taking: Reported on 10/19/2021)  ? lamoTRIgine (LAMICTAL) 25 MG tablet TAKE 1 TABLET BY MOUTH EVERYDAY AT BEDTIME (Patient taking differently: Take 25 mg by mouth daily.)  ? LORazepam (ATIVAN) 1 MG tablet Take 1 mg by mouth 2 (two) times daily as needed for anxiety.  ? LORazepam (ATIVAN) 2 MG/ML concentrated solution Take 0.5 mLs (1 mg total) by mouth 3 (three) times daily. (Patient not taking: Reported on 10/19/2021)  ? LORazepam (ATIVAN) 2 MG/ML concentrated solution Place 0.5 mLs (1  mg total) under the tongue every hour as needed for anxiety, seizure, sedation or sleep (distress). (Patient not taking: Reported on 10/19/2021)  ? magnesium oxide (MAG-OX) 400 MG tablet Take 1 tablet (400 mg total) by mouth 2 (two) times daily for 5 days.  ? metoprolol tartrate (LOPRESSOR) 25 MG tablet Take 1 tablet (25 mg total) by mouth 3 (three) times daily.  ? montelukast (SINGULAIR) 10 MG tablet TAKE 1 TABLET BY MOUTH EVERYDAY AT BEDTIME (Patient not taking:  Reported on 10/19/2021)  ? Morphine Sulfate (MORPHINE CONCENTRATE) 10 MG/0.5ML SOLN concentrated solution Take 0.5 mLs (10 mg total) by mouth every 6 (six) hours. (Patient taking differently: Take 5 mg by mouth every 4 (four) hours as needed for severe pain.)  ? Morphine Sulfate (MORPHINE CONCENTRATE) 10 MG/0.5ML SOLN concentrated solution Take 0.25-0.5 mLs (5-10 mg total) by mouth every 2 (two) hours as needed for severe pain, anxiety or shortness of breath (distress). (Patient not taking: Reported on 10/19/2021)  ? nitroGLYCERIN (NITROSTAT) 0.4 MG SL tablet Place 0.4 mg under the tongue every 5 (five) minutes as needed for chest pain.  ? OLANZapine (ZYPREXA) 5 MG tablet Take 5 mg by mouth at bedtime.  ? ondansetron (ZOFRAN-ODT) 4 MG disintegrating tablet Take 1 tablet (4 mg total) by mouth every 8 (eight) hours. (Patient not taking: Reported on 10/19/2021)  ? Oxycodone HCl 10 MG TABS Take 10 mg by mouth 4 (four) times daily as needed (pain).  ? OXYGEN Inhale 4 L into the lungs continuous.  ? pantoprazole (PROTONIX) 40 MG tablet TAKE 1 TABLET BY MOUTH EVERY DAY (Patient taking differently: Take 40 mg by mouth daily as needed.)  ? phenazopyridine (PYRIDIUM) 200 MG tablet Take 200 mg by mouth 3 (three) times daily as needed for pain.  ? polyethylene glycol (MIRALAX / GLYCOLAX) 17 g packet Take 17 g by mouth daily as needed for mild constipation. (Patient not taking: Reported on 10/19/2021)  ? predniSONE (DELTASONE) 5 MG tablet Take 5 mg by mouth daily with breakfast.  ? promethazine (PHENERGAN) 25 MG tablet Take 25 mg by mouth every 6 (six) hours as needed for nausea or vomiting.  ? ranolazine (RANEXA) 500 MG 12 hr tablet Take 1 tablet (500 mg total) by mouth 2 (two) times daily.  ? senna (SENOKOT) 8.6 MG TABS tablet Take 8.6 mg by mouth daily as needed for mild constipation.  ? sertraline (ZOLOFT) 100 MG tablet Take 1 tablet (100 mg total) by mouth daily. (Patient taking differently: Take 100 mg by mouth at bedtime.)  ?  sulfamethoxazole-trimethoprim (BACTRIM DS) 800-160 MG tablet Take 1 tablet by mouth every 12 (twelve) hours for 5 days.  ? ?No facility-administered encounter medications on file as of 10/23/2021.  ? ?Have you had any problems recently with your health?Patient states that she was in the hospital recently but she is feeling somewhat better. ? ?Have you had any problems with your pharmacy?Patient states that the hospice nurse discussed a lot of her medications and reviewed them because she had many that were changed.  ? ?What issues or side effects are you having with your medications?Patient is not having any side effects to any medications  ? ?What would you like me to pass along to [facility] Pharmacist, for them to help you with? Patient states that she is doing ok ? ?What can we do to take care of you better? Patient states that she does not need anything at this time ? ?Care Gaps: ?Colonoscopy-12/02/17 ?Diabetic Foot Exam-08/13/20 ?  Mammogram-NA ?Ophthalmology-10/31/20 ?Dexa Scan - NA ?Annual Well Visit - NA ?Micro albumin-08/13/20 ?Hemoglobin A1c- 10/20/21 ? ?Star Rating Drugs: ?None ID ? ?Ethelene Hal ?Clinical Pharmacist Assistant ?[phone]  ?

## 2021-10-29 ENCOUNTER — Encounter: Payer: Medicare Other | Admitting: Vascular Surgery

## 2021-10-29 ENCOUNTER — Encounter (HOSPITAL_COMMUNITY): Payer: Medicare Other

## 2021-11-06 ENCOUNTER — Other Ambulatory Visit: Payer: Self-pay | Admitting: Physical Medicine & Rehabilitation

## 2021-11-06 DIAGNOSIS — M47816 Spondylosis without myelopathy or radiculopathy, lumbar region: Secondary | ICD-10-CM

## 2021-11-12 ENCOUNTER — Other Ambulatory Visit (HOSPITAL_COMMUNITY): Payer: Self-pay

## 2021-11-15 DIAGNOSIS — I480 Paroxysmal atrial fibrillation: Secondary | ICD-10-CM | POA: Diagnosis not present

## 2021-11-15 DIAGNOSIS — J9621 Acute and chronic respiratory failure with hypoxia: Secondary | ICD-10-CM | POA: Diagnosis not present

## 2021-11-15 DIAGNOSIS — L03317 Cellulitis of buttock: Secondary | ICD-10-CM | POA: Diagnosis not present

## 2021-11-15 DIAGNOSIS — E118 Type 2 diabetes mellitus with unspecified complications: Secondary | ICD-10-CM | POA: Diagnosis not present

## 2021-11-15 DIAGNOSIS — Z66 Do not resuscitate: Secondary | ICD-10-CM | POA: Diagnosis not present

## 2021-11-15 DIAGNOSIS — I214 Non-ST elevation (NSTEMI) myocardial infarction: Secondary | ICD-10-CM | POA: Diagnosis not present

## 2021-11-15 DIAGNOSIS — I2510 Atherosclerotic heart disease of native coronary artery without angina pectoris: Secondary | ICD-10-CM | POA: Diagnosis not present

## 2021-11-15 DIAGNOSIS — I5033 Acute on chronic diastolic (congestive) heart failure: Secondary | ICD-10-CM | POA: Diagnosis not present

## 2021-11-15 DIAGNOSIS — J449 Chronic obstructive pulmonary disease, unspecified: Secondary | ICD-10-CM | POA: Diagnosis not present

## 2021-11-15 DIAGNOSIS — F419 Anxiety disorder, unspecified: Secondary | ICD-10-CM | POA: Diagnosis not present

## 2021-11-15 DIAGNOSIS — Z940 Kidney transplant status: Secondary | ICD-10-CM | POA: Diagnosis not present

## 2021-11-15 DIAGNOSIS — F32A Depression, unspecified: Secondary | ICD-10-CM | POA: Diagnosis not present

## 2021-11-18 ENCOUNTER — Telehealth: Payer: Medicare Other

## 2021-11-20 ENCOUNTER — Encounter (HOSPITAL_COMMUNITY): Payer: Self-pay

## 2021-11-20 ENCOUNTER — Emergency Department (HOSPITAL_COMMUNITY): Payer: Medicare Other

## 2021-11-20 ENCOUNTER — Other Ambulatory Visit: Payer: Self-pay

## 2021-11-20 ENCOUNTER — Inpatient Hospital Stay (HOSPITAL_COMMUNITY)
Admission: EM | Admit: 2021-11-20 | Discharge: 2021-11-22 | DRG: 190 | Disposition: A | Discharge status: Hospice | Payer: Medicare Other | Attending: Internal Medicine | Admitting: Internal Medicine

## 2021-11-20 DIAGNOSIS — Z743 Need for continuous supervision: Secondary | ICD-10-CM | POA: Diagnosis not present

## 2021-11-20 DIAGNOSIS — Z7952 Long term (current) use of systemic steroids: Secondary | ICD-10-CM

## 2021-11-20 DIAGNOSIS — Z9981 Dependence on supplemental oxygen: Secondary | ICD-10-CM | POA: Diagnosis not present

## 2021-11-20 DIAGNOSIS — Z794 Long term (current) use of insulin: Secondary | ICD-10-CM

## 2021-11-20 DIAGNOSIS — E1169 Type 2 diabetes mellitus with other specified complication: Secondary | ICD-10-CM

## 2021-11-20 DIAGNOSIS — F419 Anxiety disorder, unspecified: Secondary | ICD-10-CM | POA: Diagnosis present

## 2021-11-20 DIAGNOSIS — Z882 Allergy status to sulfonamides status: Secondary | ICD-10-CM | POA: Diagnosis not present

## 2021-11-20 DIAGNOSIS — F32A Depression, unspecified: Secondary | ICD-10-CM | POA: Diagnosis not present

## 2021-11-20 DIAGNOSIS — J9621 Acute and chronic respiratory failure with hypoxia: Secondary | ICD-10-CM | POA: Diagnosis present

## 2021-11-20 DIAGNOSIS — I25110 Atherosclerotic heart disease of native coronary artery with unstable angina pectoris: Secondary | ICD-10-CM | POA: Diagnosis present

## 2021-11-20 DIAGNOSIS — I517 Cardiomegaly: Secondary | ICD-10-CM | POA: Diagnosis not present

## 2021-11-20 DIAGNOSIS — Z66 Do not resuscitate: Secondary | ICD-10-CM | POA: Diagnosis present

## 2021-11-20 DIAGNOSIS — J984 Other disorders of lung: Secondary | ICD-10-CM | POA: Diagnosis not present

## 2021-11-20 DIAGNOSIS — W19XXXA Unspecified fall, initial encounter: Secondary | ICD-10-CM | POA: Diagnosis not present

## 2021-11-20 DIAGNOSIS — G4733 Obstructive sleep apnea (adult) (pediatric): Secondary | ICD-10-CM | POA: Diagnosis present

## 2021-11-20 DIAGNOSIS — Z515 Encounter for palliative care: Secondary | ICD-10-CM

## 2021-11-20 DIAGNOSIS — Z043 Encounter for examination and observation following other accident: Secondary | ICD-10-CM | POA: Diagnosis not present

## 2021-11-20 DIAGNOSIS — I252 Old myocardial infarction: Secondary | ICD-10-CM

## 2021-11-20 DIAGNOSIS — I152 Hypertension secondary to endocrine disorders: Secondary | ICD-10-CM | POA: Diagnosis not present

## 2021-11-20 DIAGNOSIS — Z888 Allergy status to other drugs, medicaments and biological substances status: Secondary | ICD-10-CM

## 2021-11-20 DIAGNOSIS — E876 Hypokalemia: Secondary | ICD-10-CM | POA: Diagnosis present

## 2021-11-20 DIAGNOSIS — M069 Rheumatoid arthritis, unspecified: Secondary | ICD-10-CM | POA: Diagnosis present

## 2021-11-20 DIAGNOSIS — Z7189 Other specified counseling: Secondary | ICD-10-CM | POA: Diagnosis not present

## 2021-11-20 DIAGNOSIS — R0602 Shortness of breath: Secondary | ICD-10-CM | POA: Diagnosis not present

## 2021-11-20 DIAGNOSIS — Z885 Allergy status to narcotic agent status: Secondary | ICD-10-CM | POA: Diagnosis not present

## 2021-11-20 DIAGNOSIS — Z7901 Long term (current) use of anticoagulants: Secondary | ICD-10-CM

## 2021-11-20 DIAGNOSIS — M549 Dorsalgia, unspecified: Secondary | ICD-10-CM | POA: Diagnosis not present

## 2021-11-20 DIAGNOSIS — Z8614 Personal history of Methicillin resistant Staphylococcus aureus infection: Secondary | ICD-10-CM

## 2021-11-20 DIAGNOSIS — Z8673 Personal history of transient ischemic attack (TIA), and cerebral infarction without residual deficits: Secondary | ICD-10-CM

## 2021-11-20 DIAGNOSIS — M797 Fibromyalgia: Secondary | ICD-10-CM | POA: Diagnosis present

## 2021-11-20 DIAGNOSIS — F17210 Nicotine dependence, cigarettes, uncomplicated: Secondary | ICD-10-CM | POA: Diagnosis present

## 2021-11-20 DIAGNOSIS — Z91048 Other nonmedicinal substance allergy status: Secondary | ICD-10-CM

## 2021-11-20 DIAGNOSIS — M25511 Pain in right shoulder: Secondary | ICD-10-CM

## 2021-11-20 DIAGNOSIS — Z951 Presence of aortocoronary bypass graft: Secondary | ICD-10-CM

## 2021-11-20 DIAGNOSIS — I509 Heart failure, unspecified: Secondary | ICD-10-CM | POA: Diagnosis not present

## 2021-11-20 DIAGNOSIS — J449 Chronic obstructive pulmonary disease, unspecified: Secondary | ICD-10-CM | POA: Diagnosis present

## 2021-11-20 DIAGNOSIS — Z940 Kidney transplant status: Secondary | ICD-10-CM | POA: Diagnosis not present

## 2021-11-20 DIAGNOSIS — K219 Gastro-esophageal reflux disease without esophagitis: Secondary | ICD-10-CM | POA: Diagnosis present

## 2021-11-20 DIAGNOSIS — I5033 Acute on chronic diastolic (congestive) heart failure: Secondary | ICD-10-CM | POA: Diagnosis present

## 2021-11-20 DIAGNOSIS — E1151 Type 2 diabetes mellitus with diabetic peripheral angiopathy without gangrene: Secondary | ICD-10-CM | POA: Diagnosis present

## 2021-11-20 DIAGNOSIS — I480 Paroxysmal atrial fibrillation: Secondary | ICD-10-CM | POA: Diagnosis not present

## 2021-11-20 DIAGNOSIS — J441 Chronic obstructive pulmonary disease with (acute) exacerbation: Secondary | ICD-10-CM | POA: Diagnosis not present

## 2021-11-20 DIAGNOSIS — M25519 Pain in unspecified shoulder: Secondary | ICD-10-CM | POA: Diagnosis not present

## 2021-11-20 DIAGNOSIS — Z79899 Other long term (current) drug therapy: Secondary | ICD-10-CM

## 2021-11-20 DIAGNOSIS — Z7902 Long term (current) use of antithrombotics/antiplatelets: Secondary | ICD-10-CM

## 2021-11-20 DIAGNOSIS — Z8249 Family history of ischemic heart disease and other diseases of the circulatory system: Secondary | ICD-10-CM

## 2021-11-20 DIAGNOSIS — I959 Hypotension, unspecified: Secondary | ICD-10-CM | POA: Diagnosis not present

## 2021-11-20 DIAGNOSIS — Z881 Allergy status to other antibiotic agents status: Secondary | ICD-10-CM

## 2021-11-20 DIAGNOSIS — Z6835 Body mass index (BMI) 35.0-35.9, adult: Secondary | ICD-10-CM

## 2021-11-20 DIAGNOSIS — J438 Other emphysema: Secondary | ICD-10-CM

## 2021-11-20 DIAGNOSIS — R0902 Hypoxemia: Secondary | ICD-10-CM | POA: Diagnosis not present

## 2021-11-20 DIAGNOSIS — Z8616 Personal history of COVID-19: Secondary | ICD-10-CM

## 2021-11-20 DIAGNOSIS — Z7951 Long term (current) use of inhaled steroids: Secondary | ICD-10-CM

## 2021-11-20 DIAGNOSIS — R0689 Other abnormalities of breathing: Secondary | ICD-10-CM | POA: Diagnosis not present

## 2021-11-20 LAB — CBC WITH DIFFERENTIAL/PLATELET
Abs Immature Granulocytes: 0.04 10*3/uL (ref 0.00–0.07)
Basophils Absolute: 0.1 10*3/uL (ref 0.0–0.1)
Basophils Relative: 1 %
Eosinophils Absolute: 0.2 10*3/uL (ref 0.0–0.5)
Eosinophils Relative: 3 %
HCT: 35.5 % — ABNORMAL LOW (ref 36.0–46.0)
Hemoglobin: 10.9 g/dL — ABNORMAL LOW (ref 12.0–15.0)
Immature Granulocytes: 1 %
Lymphocytes Relative: 14 %
Lymphs Abs: 1.2 10*3/uL (ref 0.7–4.0)
MCH: 26.3 pg (ref 26.0–34.0)
MCHC: 30.7 g/dL (ref 30.0–36.0)
MCV: 85.5 fL (ref 80.0–100.0)
Monocytes Absolute: 1 10*3/uL (ref 0.1–1.0)
Monocytes Relative: 12 %
Neutro Abs: 5.8 10*3/uL (ref 1.7–7.7)
Neutrophils Relative %: 69 %
Platelets: 322 10*3/uL (ref 150–400)
RBC: 4.15 MIL/uL (ref 3.87–5.11)
RDW: 19.8 % — ABNORMAL HIGH (ref 11.5–15.5)
WBC: 8.3 10*3/uL (ref 4.0–10.5)
nRBC: 0 % (ref 0.0–0.2)

## 2021-11-20 LAB — BASIC METABOLIC PANEL
Anion gap: 11 (ref 5–15)
BUN: 8 mg/dL (ref 8–23)
CO2: 27 mmol/L (ref 22–32)
Calcium: 8.9 mg/dL (ref 8.9–10.3)
Chloride: 95 mmol/L — ABNORMAL LOW (ref 98–111)
Creatinine, Ser: 0.97 mg/dL (ref 0.44–1.00)
GFR, Estimated: >60 mL/min (ref >60)
Glucose, Bld: 267 mg/dL — ABNORMAL HIGH (ref 70–99)
Potassium: 3.2 mmol/L — ABNORMAL LOW (ref 3.5–5.1)
Sodium: 133 mmol/L — ABNORMAL LOW (ref 135–145)

## 2021-11-20 LAB — I-STAT VENOUS BLOOD GAS, ED
Acid-Base Excess: 7 mmol/L — ABNORMAL HIGH (ref 0.0–2.0)
Bicarbonate: 32.9 mmol/L — ABNORMAL HIGH (ref 20.0–28.0)
Calcium, Ion: 1.18 mmol/L (ref 1.15–1.40)
HCT: 33 % — ABNORMAL LOW (ref 36.0–46.0)
Hemoglobin: 11.2 g/dL — ABNORMAL LOW (ref 12.0–15.0)
O2 Saturation: 79 %
Potassium: 3.1 mmol/L — ABNORMAL LOW (ref 3.5–5.1)
Sodium: 133 mmol/L — ABNORMAL LOW (ref 135–145)
TCO2: 34 mmol/L — ABNORMAL HIGH (ref 22–32)
pCO2, Ven: 52.8 mmHg (ref 44–60)
pH, Ven: 7.402 (ref 7.25–7.43)
pO2, Ven: 44 mmHg (ref 32–45)

## 2021-11-20 LAB — CBG MONITORING, ED: Glucose-Capillary: 345 mg/dL — ABNORMAL HIGH (ref 70–99)

## 2021-11-20 LAB — BRAIN NATRIURETIC PEPTIDE: B Natriuretic Peptide: 497.5 pg/mL — ABNORMAL HIGH (ref 0.0–100.0)

## 2021-11-20 LAB — HIV ANTIBODY (ROUTINE TESTING W REFLEX): HIV Screen 4th Generation wRfx: NONREACTIVE

## 2021-11-20 MED ORDER — PROMETHAZINE HCL 25 MG PO TABS: 25.0000 mg | ORAL_TABLET | Freq: Four times a day (QID) | ORAL | Status: DC | PRN

## 2021-11-20 MED ORDER — PANTOPRAZOLE SODIUM 40 MG PO TBEC
40.0000 mg | DELAYED_RELEASE_TABLET | Freq: Every day | ORAL | Status: DC
  Administered 2021-11-20 – 2021-11-22 (×3): 40 mg via ORAL
  Filled 2021-11-20 (×3): qty 1

## 2021-11-20 MED ORDER — ONDANSETRON HCL 4 MG/2ML IJ SOLN
4.0000 mg | Freq: Once | INTRAMUSCULAR | Status: AC
  Administered 2021-11-20: 4 mg via INTRAVENOUS
  Filled 2021-11-20: qty 2

## 2021-11-20 MED ORDER — IPRATROPIUM BROMIDE 0.02 % IN SOLN
0.5000 mg | Freq: Four times a day (QID) | RESPIRATORY_TRACT | Status: DC
  Administered 2021-11-20 – 2021-11-21 (×3): 0.5 mg via RESPIRATORY_TRACT
  Filled 2021-11-20 (×3): qty 2.5

## 2021-11-20 MED ORDER — CLOPIDOGREL BISULFATE 75 MG PO TABS
75.0000 mg | ORAL_TABLET | Freq: Every day | ORAL | Status: DC
  Administered 2021-11-20 – 2021-11-22 (×3): 75 mg via ORAL
  Filled 2021-11-20 (×3): qty 1

## 2021-11-20 MED ORDER — METOPROLOL TARTRATE 25 MG PO TABS
25.0000 mg | ORAL_TABLET | Freq: Three times a day (TID) | ORAL | Status: DC
  Administered 2021-11-20 – 2021-11-22 (×5): 25 mg via ORAL
  Filled 2021-11-20 (×5): qty 1

## 2021-11-20 MED ORDER — MAGNESIUM SULFATE 2 GM/50ML IV SOLN
2.0000 g | Freq: Once | INTRAVENOUS | Status: AC
  Administered 2021-11-20: 2 g via INTRAVENOUS
  Filled 2021-11-20: qty 50

## 2021-11-20 MED ORDER — HYDROMORPHONE HCL 1 MG/ML IJ SOLN
1.0000 mg | Freq: Once | INTRAMUSCULAR | Status: DC
Start: 2021-11-20 — End: 2021-11-20
  Filled 2021-11-20: qty 1

## 2021-11-20 MED ORDER — SERTRALINE HCL 100 MG PO TABS
100.0000 mg | ORAL_TABLET | Freq: Every day | ORAL | Status: DC
  Administered 2021-11-20 – 2021-11-21 (×2): 100 mg via ORAL
  Filled 2021-11-20 (×2): qty 1

## 2021-11-20 MED ORDER — INSULIN GLARGINE-YFGN 100 UNIT/ML ~~LOC~~ SOLN
20.0000 [IU] | Freq: Every day | SUBCUTANEOUS | Status: DC
  Administered 2021-11-20 – 2021-11-22 (×3): 20 [IU] via SUBCUTANEOUS
  Filled 2021-11-20 (×3): qty 0.2

## 2021-11-20 MED ORDER — METHYLPREDNISOLONE SODIUM SUCC 125 MG IJ SOLR: 125.0000 mg | Freq: Once | INTRAMUSCULAR | Status: DC

## 2021-11-20 MED ORDER — ACETAMINOPHEN 325 MG PO TABS
650.0000 mg | ORAL_TABLET | ORAL | Status: DC | PRN
  Filled 2021-11-20: qty 2

## 2021-11-20 MED ORDER — GABAPENTIN 100 MG PO CAPS
100.0000 mg | ORAL_CAPSULE | Freq: Every day | ORAL | Status: DC
  Administered 2021-11-20 – 2021-11-21 (×2): 100 mg via ORAL
  Filled 2021-11-20 (×2): qty 1

## 2021-11-20 MED ORDER — BASAGLAR KWIKPEN 100 UNIT/ML ~~LOC~~ SOPN: 20.0000 [IU] | PEN_INJECTOR | Freq: Every day | SUBCUTANEOUS | Status: DC

## 2021-11-20 MED ORDER — AZATHIOPRINE 50 MG PO TABS
125.0000 mg | ORAL_TABLET | Freq: Every day | ORAL | Status: DC
  Administered 2021-11-20 – 2021-11-22 (×3): 125 mg via ORAL
  Filled 2021-11-20 (×3): qty 3

## 2021-11-20 MED ORDER — SODIUM CHLORIDE 0.9 % IV SOLN: 250.0000 mL | INTRAVENOUS | Status: DC | PRN

## 2021-11-20 MED ORDER — SODIUM CHLORIDE 0.9% FLUSH: 3.0000 mL | INTRAVENOUS | Status: DC | PRN

## 2021-11-20 MED ORDER — MORPHINE SULFATE (CONCENTRATE) 10 MG/0.5ML PO SOLN
10.0000 mg | Freq: Four times a day (QID) | ORAL | Status: DC
  Administered 2021-11-21 – 2021-11-22 (×6): 10 mg via ORAL
  Filled 2021-11-20 (×6): qty 0.5

## 2021-11-20 MED ORDER — INSULIN ASPART 100 UNIT/ML IJ SOLN
0.0000 [IU] | Freq: Every day | INTRAMUSCULAR | Status: DC
  Administered 2021-11-20: 4 [IU] via SUBCUTANEOUS
  Administered 2021-11-21: 3 [IU] via SUBCUTANEOUS

## 2021-11-20 MED ORDER — BUDESONIDE 0.5 MG/2ML IN SUSP
2.0000 mL | Freq: Two times a day (BID) | RESPIRATORY_TRACT | Status: DC
  Administered 2021-11-21 – 2021-11-22 (×3): 0.5 mg via RESPIRATORY_TRACT
  Filled 2021-11-20 (×6): qty 2

## 2021-11-20 MED ORDER — MONTELUKAST SODIUM 10 MG PO TABS
5.0000 mg | ORAL_TABLET | Freq: Every day | ORAL | Status: DC
  Administered 2021-11-20 – 2021-11-21 (×2): 5 mg via ORAL
  Filled 2021-11-20: qty 0.5
  Filled 2021-11-20: qty 1
  Filled 2021-11-20: qty 0.5

## 2021-11-20 MED ORDER — LAMOTRIGINE 25 MG PO TABS
25.0000 mg | ORAL_TABLET | Freq: Every day | ORAL | Status: DC
  Administered 2021-11-20 – 2021-11-22 (×3): 25 mg via ORAL
  Filled 2021-11-20 (×3): qty 1

## 2021-11-20 MED ORDER — INSULIN ASPART 100 UNIT/ML IJ SOLN
0.0000 [IU] | Freq: Three times a day (TID) | INTRAMUSCULAR | Status: DC
  Administered 2021-11-21: 15 [IU] via SUBCUTANEOUS
  Administered 2021-11-21: 11 [IU] via SUBCUTANEOUS
  Administered 2021-11-22: 3 [IU] via SUBCUTANEOUS
  Administered 2021-11-22: 11 [IU] via SUBCUTANEOUS
  Administered 2021-11-22: 7 [IU] via SUBCUTANEOUS

## 2021-11-20 MED ORDER — PREDNISONE 20 MG PO TABS: 40.0000 mg | ORAL_TABLET | Freq: Every day | ORAL | Status: DC

## 2021-11-20 MED ORDER — CYCLOBENZAPRINE HCL 5 MG PO TABS: 10.0000 mg | ORAL_TABLET | Freq: Three times a day (TID) | ORAL | Status: DC | PRN

## 2021-11-20 MED ORDER — POTASSIUM CHLORIDE CRYS ER 20 MEQ PO TBCR
20.0000 meq | EXTENDED_RELEASE_TABLET | Freq: Once | ORAL | Status: AC
Start: 2021-11-20 — End: 2021-11-20
  Administered 2021-11-20: 20 meq via ORAL
  Filled 2021-11-20: qty 1

## 2021-11-20 MED ORDER — LORAZEPAM 1 MG PO TABS
1.0000 mg | ORAL_TABLET | Freq: Two times a day (BID) | ORAL | Status: DC | PRN
  Administered 2021-11-21 – 2021-11-22 (×2): 1 mg via ORAL
  Filled 2021-11-20 (×2): qty 1

## 2021-11-20 MED ORDER — SODIUM CHLORIDE 0.9% FLUSH
3.0000 mL | Freq: Two times a day (BID) | INTRAVENOUS | Status: DC
  Administered 2021-11-20 – 2021-11-22 (×4): 3 mL via INTRAVENOUS

## 2021-11-20 MED ORDER — POTASSIUM CHLORIDE CRYS ER 20 MEQ PO TBCR
40.0000 meq | EXTENDED_RELEASE_TABLET | Freq: Once | ORAL | Status: AC
  Administered 2021-11-20: 40 meq via ORAL
  Filled 2021-11-20: qty 2

## 2021-11-20 MED ORDER — IPRATROPIUM-ALBUTEROL 0.5-2.5 (3) MG/3ML IN SOLN
3.0000 mL | Freq: Once | RESPIRATORY_TRACT | Status: AC
  Administered 2021-11-20: 3 mL via RESPIRATORY_TRACT
  Filled 2021-11-20: qty 3

## 2021-11-20 MED ORDER — FUROSEMIDE 10 MG/ML IJ SOLN
40.0000 mg | Freq: Two times a day (BID) | INTRAMUSCULAR | Status: DC
  Administered 2021-11-20 – 2021-11-22 (×4): 40 mg via INTRAVENOUS
  Filled 2021-11-20 (×4): qty 4

## 2021-11-20 MED ORDER — FLUTICASONE PROPIONATE 50 MCG/ACT NA SUSP
2.0000 | Freq: Every day | NASAL | Status: DC
  Administered 2021-11-21 – 2021-11-22 (×2): 2 via NASAL
  Filled 2021-11-20: qty 16

## 2021-11-20 MED ORDER — METHYLPREDNISOLONE SODIUM SUCC 125 MG IJ SOLR
125.0000 mg | Freq: Once | INTRAMUSCULAR | Status: AC
Start: 2021-11-20 — End: 2021-11-20
  Administered 2021-11-20: 125 mg via INTRAVENOUS
  Filled 2021-11-20: qty 2

## 2021-11-20 MED ORDER — LIDOCAINE 5 % EX PTCH
1.0000 | MEDICATED_PATCH | CUTANEOUS | Status: DC
  Administered 2021-11-20 – 2021-11-22 (×3): 1 via TRANSDERMAL
  Filled 2021-11-20 (×4): qty 1

## 2021-11-20 MED ORDER — APIXABAN 5 MG PO TABS
5.0000 mg | ORAL_TABLET | Freq: Two times a day (BID) | ORAL | Status: DC
  Administered 2021-11-20 – 2021-11-22 (×4): 5 mg via ORAL
  Filled 2021-11-20 (×4): qty 1

## 2021-11-20 MED ORDER — MORPHINE SULFATE (PF) 4 MG/ML IV SOLN
4.0000 mg | Freq: Once | INTRAVENOUS | Status: AC
  Administered 2021-11-20: 4 mg via INTRAVENOUS
  Filled 2021-11-20: qty 1

## 2021-11-20 MED ORDER — RANOLAZINE ER 500 MG PO TB12
500.0000 mg | ORAL_TABLET | Freq: Two times a day (BID) | ORAL | Status: DC
  Administered 2021-11-20 – 2021-11-22 (×4): 500 mg via ORAL
  Filled 2021-11-20 (×5): qty 1

## 2021-11-20 MED ORDER — OLANZAPINE 5 MG PO TABS
5.0000 mg | ORAL_TABLET | Freq: Every day | ORAL | Status: DC
  Administered 2021-11-20 – 2021-11-21 (×2): 5 mg via ORAL
  Filled 2021-11-20 (×5): qty 1

## 2021-11-20 MED ORDER — ONDANSETRON HCL 4 MG/2ML IJ SOLN
4.0000 mg | Freq: Four times a day (QID) | INTRAMUSCULAR | Status: DC | PRN
Start: 2021-11-20 — End: 2021-11-22

## 2021-11-20 MED ORDER — SENNA 8.6 MG PO TABS: 8.6000 mg | ORAL_TABLET | Freq: Every day | ORAL | Status: DC | PRN

## 2021-11-20 NOTE — H&P (Signed)
?History and Physical  ? ? ?Katherine [name] NTI:[id number] DOB: Jun 06, 1956 DOA: 11/20/2021 ? ?PCP: [name], MD (Confirm with patient/family/NH records and if not entered, this has to be entered at [facility] point of entry) ?Patient coming from: Home ? ?I have personally briefly reviewed patient's old medical records in [place] ? ?Chief Complaint: Wheezing, cough, right shoulder pain. ? ?HPI: Katherine [name] is a 66 y.o. female with medical history significant of severe CAD with recent STEMI with treatment failure on home hospice, chronic angina on ranolazine and morphine with hospice, COPD, chronic hypoxic respiratory failure on 2 L [place] continuously, chronic diastolic CHF, cigarette smoker, PAF on Eliquis, IDDM, history of kidney transplant on Imuran and chronic steroid, presented with new onset of wheezing cough shortness of breath. ? ?Patient reported that yesterday she was tracing her door and then missed a closing door which jammed her right shoulder and upper arm.  She has had severe pain associated with right shoulder, worsening with deep breath and she started to have wheezing since last night associated with some dry cough, no fever, no chest pains.  She has been on home hospice and taking morphine every 6 hours.  Feeling increasing shortness of breath she came in today.  Patient reports still smokes every day. ? ?ED Course: O2 saturation 85% on 2 L, stabilized on 6 L. ? ?Chest x-ray showed pulmonary vascular congestion, right shoulder x-ray negative for dislocation or fracture. ? ?Review of Systems: As per HPI otherwise 14 point review of systems negative.  ? ? ?Past Medical History:  ?Diagnosis Date  ? Anemia   ? Anxiety   ? Bilateral carotid artery stenosis   ? Carotid duplex 03/6760: 9-50% LICA, 93-26% RICA, >71% RECA, f/u 1 yr suggested  ? CAD (coronary artery disease) of bypass graft 5/01; 3/'02, 8/'03, 10/'04; 1/15  ? PCI x 5 to SVG-D1   ? CAD in native artery 07/1993  ? 3 Vessel Disease  (LAD-D1 & RCA) -- CABG (Dx in setting of inferior STEMI-PTCA of RCA)  ? COPD mixed type ([place])   ? Followed by Dr. Lamonte Sakai "pulmonologist said no COPD"  ? Depression with anxiety   ? Diabetes mellitus type 2 in obese [facility])   ? Diarrhea   ? started after cholecystectomy and mass removed from intestine  ? Dyslipidemia, goal LDL below 70   ? 08/2012: TC 137, TG 200, HDL 32!, LDL 45; on statin (followed by Dr.Deterding)  ? ESRD (end stage renal disease) ([place]) 1991  ? s/p Cadaveric Renal Transplant [facility] - Dr. Jimmy Footman)   ? Family history of adverse reaction to anesthesia   ? mom's bp dropped during/after anesthesia  ? Fibromyalgia   ? GERD (gastroesophageal reflux disease)   ? H/O: GI bleed   ? History of kidney stones   ? History of stroke 2012  ? "right eye stroke- half blind now"  ? 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 Witnessed syncopal event.  Had having having lots of nausea and vomiting with poor p.o. intake.  Thought to have QT prolongation with multiple medications involved and hypomagnesemia, hypokalemia.  Tikosyn discontinued along with Zoloft and Phenergan.  ? Hypertension associated with diabetes ([place])   ? Mild aortic stenosis by prior echocardiogram 07/2019  ? Echo:  Mild aortic stenosis (gradients: Mean 14.3 mmHg -peak 24.9 mmHg).  ? Morbid obesity ([place])   ? MRSA (methicillin resistant staph aureus) culture positive   ? OSA (obstructive sleep  apnea)   ? no longer on CPAP or home O2, states she doesn't need now after lap band  ? PAD (peripheral artery disease) ([place]) 08/2013  ? LEA Dopplers to be read by Dr. Fletcher Anon  ? PAF (paroxysmal atrial fibrillation) ([place]) 06/2014  ? Noted on CardioNet Monitor  - --> rhythm control with Tikosyn (Dr. Rayann Heman); converted from warfarin to apixaban for anticoagulation.  ? Pneumonia   ? Recurrent boils   ? Bilateral Groin  ? Rheumatoid arthritis ([place])   ? Per Patient Report; associated with OA  ? S/p cadaver renal transplant 1991  ? [place]  ? ? ?Past Surgical  History:  ?Procedure Laterality Date  ? ABDOMINAL AORTOGRAM N/A 04/21/2018  ? Procedure: ABDOMINAL AORTOGRAM;  Surgeon: [name], MD;  Location: [place] CV LAB;  Service: Cardiovascular;  Laterality: N/A;  ? CATHETER REMOVAL    ? CHOLECYSTECTOMY N/A 10/29/2014  ? Procedure: LAPAROSCOPIC CHOLECYSTECTOMY WITH INTRAOPERATIVE CHOLANGIOGRAM;  Surgeon: Excell Seltzer, MD;  Location: [facility];  Service: General;  Laterality: N/A;  ? [place]  ? x5  ? CORONARY ARTERY BYPASS GRAFT  1995  ? LIMA-LAD, SVG-RPDA, SVG-D1  ? CORONARY STENT INTERVENTION N/A 02/20/2021  ? Procedure: PERCUTANEOUS CORONARY STENT INTERVENTION;  Surgeon: [name], MD;  Location: [place] INVASIVE CV LAB; ostLCx 60% (Neg RFR 0.96);; SVG- D2 recurrent 90% ISR & 95% native D2 after graft-> DES PCI of 95% anastomotic D2 lesion ([place] 2.25 mm x 12 mm => 2.75 mm @ overlap, 2.5 distal.);PTCA of ISR in body of graft. ->  2.5 mm scoring balloon & post-dil w/ 2.75 mm Teresita balloon  ? ESOPHAGOGASTRODUODENOSCOPY N/A 10/15/2016  ? Procedure: ESOPHAGOGASTRODUODENOSCOPY (EGD);  Surgeon: Wilford Corner, MD;  Location: [facility] ENDOSCOPY;  Service: Endoscopy;  Laterality: N/A;  ? I & D EXTREMITY Right 01/29/2018  ? Procedure: IRRIGATION AND DEBRIDEMENT THUMB;  Surgeon: Dayna Barker, MD;  Location: [place];  Service: Plastics;  Laterality: Right;  ? INCISE AND DRAIN ABCESS    ? INTRAVASCULAR PRESSURE WIRE/FFR STUDY N/A 02/20/2021  ? Procedure: INTRAVASCULAR PRESSURE WIRE/FFR STUDY;  Surgeon: [name], MD;  Location: [place] CV LAB;  Service: Cardiovascular;  Laterality: N/A;  ? KIDNEY TRANSPLANT  1991  ? KNEE ARTHROSCOPY WITH LATERAL MENISECTOMY Left 12/03/2017  ? Procedure: LEFT KNEE ARTHROSCOPY WITH LATERAL MENISECTOMY;  Surgeon: Earlie Server, MD;  Location: [place];  Service: Orthopedics;  Laterality: Left;  ? LAPAROSCOPIC GASTRIC BANDING  04/2004; 10/'09, 2/'10  ? Port Replacement x 2  ? LEFT HEART CATH AND CORONARY  ANGIOGRAPHY N/A 02/20/2021  ? Procedure: LEFT HEART CATH AND CORONARY ANGIOGRAPHY;  Surgeon: [name], MD;  Location: [place] CV LAB;  Service: Cardiovascular;  Laterality: N/A;  ? LEFT HEART CATH AND CORS/GRAFTS ANGIOGRAPHY N/A 04/21/2018  ? Procedure: LEFT HEART CATH AND CORS/GRAFTS ANGIOGRAPHY;  Surgeon: [name], MD;  Location: [place] CV LAB;  Ost-Prox LAD 50% - proxLAD (pre & post D1) 100% CTO. Cx - patent, small OM1 (stable ~ ostial OM1 90%, too small for PCI) & 2 small LPL; Ost-distal RCA 100% CTO.  LIMA-LAD (not injected); SVG-dRCA patent, SVG-D1 - insertion stent ~20% ISR - Severe R CFA disease w/ focal Sub TO  ? LEFT HEART CATH AND CORS/GRAFTS ANGIOGRAPHY  5/'01, 3/'02, 8/'03, 10/'04; 1/'15  ? 08/22/2013: LAD & RCA 100%; LIMA-LAD & SVG-rPDA patent; Cx-- OM1 60%, OM2 ostial ~50%; SVG-D1 - 80% mid, 50% distal ISR --PCI  ? LEFT HEART CATH  AND CORS/GRAFTS ANGIOGRAPHY N/A 01/31/2021  ? Procedure: LEFT HEART CATH AND CORS/GRAFTS ANGIOGRAPHY;  Surgeon: Jolaine Artist, MD;  Location: [place] CV LAB;;  ? LEFT HEART CATH AND CORS/GRAFTS ANGIOGRAPHY N/A 09/05/2021  ? Procedure: LEFT HEART CATH AND CORS/GRAFTS ANGIOGRAPHY;  Surgeon: [name], MD;  Location: [place] CV LAB;  Service: Cardiovascular;  Laterality: N/A;  ? LEFT HEART CATH AND CORS/GRAFTS ANGIOGRAPHY N/A 08/21/2021  ? Procedure: LEFT HEART CATH AND CORS/GRAFTS ANGIOGRAPHY;  Surgeon: Belva Crome, MD;  Location: [place] CV LAB;  Service: Cardiovascular;  Laterality: N/A;  ? LEFT HEART CATHETERIZATION WITH CORONARY/GRAFT ANGIOGRAM N/A 08/23/2013  ? Procedure: LEFT HEART CATHETERIZATION WITH Beatrix Fetters;  Surgeon: Wellington Hampshire, MD;  Location: [facility] CATH LAB;  Service: Cardiovascular;  Laterality: N/A;  ? Lower Extremity Arterial Dopplers  08/2013  ? ABI: R 0.96, L 1.04  ? MULTIPLE TOOTH EXTRACTIONS  age 63  ? NM MYOVIEW LTD  03/2016  ? EF 62%. LOW RISK. C/W prior MI - no Ischemia. Apical  hypokinesis.  ? PERCUTANEOUS CORONARY STENT INTERVENTION (PCI-S)  5/'01, 3/'02, 8/'03, 10/'04;  ? '01 - S660 BMS 2.5 x 9 - dSVG-D1 into D1; '02- post-stent stenosis - 2.5 x 8 Pixel BMS; '8\03: ISR/Thrombosis into native

## 2021-11-20 NOTE — ED Notes (Signed)
Pt o2 sats 85% on 2lmp. Pt increased to 6lpm now mantaining at 92% ?

## 2021-11-20 NOTE — ED Triage Notes (Addendum)
PER EMS: pt fell last night after tripping over her dog. She fell onto her right shoulder and upper arm hit the door jam. She reports pain to R shoulder and arm ?BP- 120/90, HR-115, O2-87% on [facility] which she wears 2L [place] at baseline. 92% on 6L [place] currently. ?She reports feeling more SOB than usual. ?

## 2021-11-20 NOTE — Progress Notes (Signed)
Pt has PRN orders, no distress noted at this time. Pt placed on 6L HFNC ([place]). Sp02 96% ?

## 2021-11-20 NOTE — ED Provider Notes (Signed)
?[employer]ry ?Provider Note ? ? ?CSN: [id number] ?Arrival date & time: 11/20/21  1310 ? ?  ? ?History ? ?Chief Complaint  ?Patient presents with  ? Shoulder Injury  ? Shortness of Breath  ? ? ?Katherine [name] is a 66 y.o. female. ? ?Patient with history of diabetes, CHF, COPD, CAD with CABG, recent NSTEMI, afib on Elliquis, htn, currrently on hospice who presents today with complaints of shoulder pain and shortness of breath. State that last night she injured her right shoulder when she tripped over her dog and hit her right shoulder and upper arm on a door jam. She states she has been having worsening pain in the right shoulder and associated shortness of breath since that time. Of note, patient is on 5L [place] continuously at home. She denies any other injuries. She did not hit her head or loose consciousness. She is on regular morphine and endorses continued daily smoking. She states that the pain is now unbearable and she is having significant difficulty breathing due to pain. Denies fevers, chills, chest pain. ? ?The history is provided by the patient. No language interpreter was used.  ?Shoulder Injury ?Associated symptoms include shortness of breath. Pertinent negatives include no chest pain and no abdominal pain.  ?Shortness of Breath ?Associated symptoms: wheezing   ?Associated symptoms: no abdominal pain, no chest pain, no cough, no fever and no vomiting   ? ?  ? ?Home Medications ?Prior to Admission medications   ?Medication Sig Start Date End Date Taking? Authorizing Provider  ?albuterol (VENTOLIN HFA) 108 (90 Base) MCG/ACT inhaler TAKE 2 PUFFS BY MOUTH EVERY 6 HOURS AS NEEDED FOR WHEEZE OR SHORTNESS OF BREATH ?Patient taking differently: 2 puffs every 6 (six) hours as needed for wheezing or shortness of breath. 02/10/21   Byrum, Rose Fillers, MD  ?apixaban (ELIQUIS) 5 MG TABS tablet Take 1 tablet (5 mg total) by mouth 2 (two) times daily. 10/21/21   Pokhrel, Corrie Mckusick, MD   ?arformoterol (BROVANA) 15 MCG/2ML NEBU Take 2 mLs (15 mcg total) by nebulization 2 (two) times daily. 08/30/19   Collene Gobble, MD  ?azaTHIOprine (IMURAN) 50 MG tablet Take 2 & 1/2 tablets (125 mg) by mouth daily at 3pm 10/21/21   Pokhrel, Laxman, MD  ?bisacodyl (DULCOLAX) 5 MG EC tablet Take 1 tablet (5 mg total) by mouth daily as needed for moderate constipation. ?Patient not taking: Reported on 10/19/2021 10/05/21   Allie Bossier, MD  ?budesonide (PULMICORT) 0.5 MG/2ML nebulizer solution Take 2 mLs (0.5 mg total) by nebulization 2 (two) times daily. 08/24/19   [name], MD  ?clopidogrel (PLAVIX) 75 MG tablet Take 75 mg by mouth daily.    [provider]  ?cyclobenzaprine (FLEXERIL) 10 MG tablet Take 1 tablet (10 mg total) by mouth 3 (three) times daily as needed for muscle spasms. 08/25/21   Kathie Dike, MD  ?docusate sodium (COLACE) 100 MG capsule Take 1 capsule (100 mg total) by mouth 2 (two) times daily. ?Patient not taking: Reported on 10/19/2021 10/05/21   Allie Bossier, MD  ?fluticasone (FLONASE) 50 MCG/ACT nasal spray PLACE 2 SPRAYS INTO BOTH NOSTRILS 2 TIMES DAILY. ?Patient taking differently: 2 sprays in the morning and at bedtime. 06/27/21   Collene Gobble, MD  ?furosemide (LASIX) 40 MG tablet Take 40 mg by mouth daily.    [provider]  ?gabapentin (NEURONTIN) 100 MG capsule Take 1 capsule (100 mg total) by mouth at bedtime. 09/11/21  Hosie Poisson, MD  ?insulin aspart (NOVOLOG FLEXPEN) 100 UNIT/ML FlexPen Inject 4-10 Units into the skin 3 (three) times daily with meals. Sliding scale    [provider]  ?Insulin Glargine (BASAGLAR KWIKPEN) 100 UNIT/ML Inject 20 Units into the skin daily.    [provider]  ?isosorbide mononitrate (IMDUR) 60 MG 24 hr tablet Take 1 tablet (60 mg total) by mouth daily. ?Patient not taking: Reported on 10/19/2021 09/27/21 10/27/21  Arrien, Jimmy Picket, MD  ?lamoTRIgine (LAMICTAL) 25 MG tablet TAKE 1 TABLET BY MOUTH EVERYDAY AT  BEDTIME ?Patient taking differently: Take 25 mg by mouth daily. 07/25/21   Meredith Staggers, MD  ?LORazepam (ATIVAN) 1 MG tablet Take 1 mg by mouth 2 (two) times daily as needed for anxiety.    [provider]  ?LORazepam (ATIVAN) 2 MG/ML concentrated solution Take 0.5 mLs (1 mg total) by mouth 3 (three) times daily. ?Patient not taking: Reported on 10/19/2021 10/05/21   Allie Bossier, MD  ?LORazepam (ATIVAN) 2 MG/ML concentrated solution Place 0.5 mLs (1 mg total) under the tongue every hour as needed for anxiety, seizure, sedation or sleep (distress). ?Patient not taking: Reported on 10/19/2021 10/05/21   Allie Bossier, MD  ?metoprolol tartrate (LOPRESSOR) 25 MG tablet Take 1 tablet (25 mg total) by mouth 3 (three) times daily. 09/11/21   Hosie Poisson, MD  ?montelukast (SINGULAIR) 10 MG tablet TAKE 1 TABLET BY MOUTH EVERYDAY AT BEDTIME ?Patient not taking: Reported on 10/19/2021 09/23/21   Collene Gobble, MD  ?Morphine Sulfate (MORPHINE CONCENTRATE) 10 MG/0.5ML SOLN concentrated solution Take 0.5 mLs (10 mg total) by mouth every 6 (six) hours. ?Patient taking differently: Take 5 mg by mouth every 4 (four) hours as needed for severe pain. 10/05/21   Allie Bossier, MD  ?Morphine Sulfate (MORPHINE CONCENTRATE) 10 MG/0.5ML SOLN concentrated solution Take 0.25-0.5 mLs (5-10 mg total) by mouth every 2 (two) hours as needed for severe pain, anxiety or shortness of breath (distress). ?Patient not taking: Reported on 10/19/2021 10/05/21   Allie Bossier, MD  ?nitroGLYCERIN (NITROSTAT) 0.4 MG SL tablet Place 0.4 mg under the tongue every 5 (five) minutes as needed for chest pain.    [provider]  ?OLANZapine (ZYPREXA) 5 MG tablet Take 5 mg by mouth at bedtime. 10/02/21   [provider]  ?ondansetron (ZOFRAN-ODT) 4 MG disintegrating tablet Take 1 tablet (4 mg total) by mouth every 8 (eight) hours. ?Patient not taking: Reported on 10/19/2021 10/05/21   Allie Bossier, MD  ?Oxycodone HCl 10 MG TABS Take  10 mg by mouth 4 (four) times daily as needed (pain).    [provider]  ?OXYGEN Inhale 4 L into the lungs continuous.    [provider]  ?pantoprazole (PROTONIX) 40 MG tablet TAKE 1 TABLET BY MOUTH EVERY DAY ?Patient taking differently: Take 40 mg by mouth daily as needed. 10/14/21   Meredith Staggers, MD  ?phenazopyridine (PYRIDIUM) 200 MG tablet Take 200 mg by mouth 3 (three) times daily as needed for pain.    [provider]  ?polyethylene glycol (MIRALAX / GLYCOLAX) 17 g packet Take 17 g by mouth daily as needed for mild constipation. ?Patient not taking: Reported on 10/19/2021 10/05/21   Allie Bossier, MD  ?predniSONE (DELTASONE) 5 MG tablet Take 5 mg by mouth daily with breakfast.    [provider]  ?promethazine (PHENERGAN) 25 MG tablet Take 25 mg by mouth every 6 (six) hours as needed for  nausea or vomiting.    [provider]  ?ranolazine (RANEXA) 500 MG 12 hr tablet Take 1 tablet (500 mg total) by mouth 2 (two) times daily. 09/11/21   Hosie Poisson, MD  ?senna (SENOKOT) 8.6 MG TABS tablet Take 8.6 mg by mouth daily as needed for mild constipation.    [provider]  ?sertraline (ZOLOFT) 100 MG tablet Take 1 tablet (100 mg total) by mouth daily. ?Patient taking differently: Take 100 mg by mouth at bedtime. 06/06/21   [name], MD  ?   ? ?Allergies    ?Tetracycline, Niacin, Niaspan [niacin er], Sulfa antibiotics, Sulfonamide derivatives, Codeine, Erythromycin, Hydromorphone hcl, Morphine and related, Nalbuphine, Sulfasalazine, and Tape   ? ?Review of Systems   ?Review of Systems  ?Constitutional:  Negative for chills and fever.  ?Respiratory:  Positive for shortness of breath and wheezing. Negative for cough.   ?Cardiovascular:  Negative for chest pain and leg swelling.  ?Gastrointestinal:  Negative for abdominal pain, diarrhea, nausea and vomiting.  ?Musculoskeletal:  Positive for arthralgias and myalgias.  ?All other systems reviewed and are  negative. ? ?Physical Exam ?Updated Vital Signs ?BP 112/79 (BP Location: Right Arm)   Pulse (!) 118   Temp 99.3 ?F (37.4 ?C) (Oral)   Resp 20   Ht '5\' 1"'$  (1.549 m)   SpO2 92%   BMI 35.62 kg/m?  ?Physical Exam

## 2021-11-20 NOTE — ED Notes (Signed)
Ice pack applied to right arm ?

## 2021-11-20 NOTE — ED Provider Triage Note (Signed)
Emergency Medicine Provider Triage Evaluation Note ? ?Katherine [name] , a 66 y.o. female  was evaluated in triage.  Pt complains of right shoulder injury onset last night.  She notes that she fell and tripped over her dog and hit her right shoulder and upper arm on the door jam.  Patient notes that she wears 5 L of oxygen at home continuously.  She has associated increased shortness of breath.  Has not tried any medication for her symptoms.  Denies chest pain, hitting her head, or LOC ? ?Review of Systems  ?Positive: As per HPI above ?Negative:  ? ?Physical Exam  ?BP 112/79 (BP Location: Right Arm)   Pulse (!) 118   Temp 99.3 ?F (37.4 ?C) (Oral)   Resp 20   Ht '5\' 1"'$  (1.549 m)   SpO2 92%   BMI 35.62 kg/m?  ?Gen:   Awake, no distress, increased work of breathing ?Resp:  Normal effort  ?MSK:   Moves extremities without difficulty ?Other:  Decreased lung sounds noted to right upper lungs.  Patient right shoulder in the sling.  No tenderness to palpation noted to anterior posterior shoulder.  No obvious deformity. ? ?Medical Decision Making  ?Medically screening exam initiated at 1:41 PM.  Appropriate orders placed.  Katherine [name] was informed that the remainder of the evaluation will be completed by another provider, this initial triage assessment does not replace that evaluation, and the importance of remaining in the ED until their evaluation is complete. ? ?1:42 PM - Discussed with RN that patient is in need of a room immediately. RN aware and working on room placement.  ?  ?Alben Jepsen A, PA-C ?11/20/21 1346 ? ?

## 2021-11-21 ENCOUNTER — Inpatient Hospital Stay (HOSPITAL_COMMUNITY): Payer: Medicare Other

## 2021-11-21 DIAGNOSIS — M25511 Pain in right shoulder: Secondary | ICD-10-CM | POA: Diagnosis not present

## 2021-11-21 DIAGNOSIS — Z7189 Other specified counseling: Secondary | ICD-10-CM

## 2021-11-21 DIAGNOSIS — R0602 Shortness of breath: Secondary | ICD-10-CM | POA: Diagnosis not present

## 2021-11-21 DIAGNOSIS — J438 Other emphysema: Secondary | ICD-10-CM | POA: Diagnosis not present

## 2021-11-21 LAB — BASIC METABOLIC PANEL
Anion gap: 8 (ref 5–15)
BUN: 12 mg/dL (ref 8–23)
CO2: 29 mmol/L (ref 22–32)
Calcium: 8.7 mg/dL — ABNORMAL LOW (ref 8.9–10.3)
Chloride: 97 mmol/L — ABNORMAL LOW (ref 98–111)
Creatinine, Ser: 1.01 mg/dL — ABNORMAL HIGH (ref 0.44–1.00)
GFR, Estimated: >60 mL/min (ref >60)
Glucose, Bld: 338 mg/dL — ABNORMAL HIGH (ref 70–99)
Potassium: 4.3 mmol/L (ref 3.5–5.1)
Sodium: 134 mmol/L — ABNORMAL LOW (ref 135–145)

## 2021-11-21 LAB — GLUCOSE, CAPILLARY: Glucose-Capillary: 259 mg/dL — ABNORMAL HIGH (ref 70–99)

## 2021-11-21 LAB — CBG MONITORING, ED
Glucose-Capillary: 300 mg/dL — ABNORMAL HIGH (ref 70–99)
Glucose-Capillary: 302 mg/dL — ABNORMAL HIGH (ref 70–99)

## 2021-11-21 MED ORDER — PREDNISONE 5 MG PO TABS
5.0000 mg | ORAL_TABLET | Freq: Every day | ORAL | Status: DC
  Administered 2021-11-21 – 2021-11-22 (×2): 5 mg via ORAL
  Filled 2021-11-21 (×3): qty 1

## 2021-11-21 MED ORDER — IPRATROPIUM BROMIDE 0.02 % IN SOLN
0.5000 mg | Freq: Two times a day (BID) | RESPIRATORY_TRACT | Status: DC
  Administered 2021-11-22: 0.5 mg via RESPIRATORY_TRACT
  Filled 2021-11-21: qty 2.5

## 2021-11-21 NOTE — ED Notes (Signed)
Pt resting in bed with eyes closed, respirations are even and non-labored  ?Skin is warm, dry and intact. NAD  ? ?Call light within reach.  ?

## 2021-11-21 NOTE — Progress Notes (Signed)
?PROGRESS NOTE ? ? ? ?Katherine [name]  PJA:[id number] DOB: May 28, 1956 DOA: 11/20/2021 ?PCP: [name], MD  ?[id number] w/ complicated medical history, h/o CAD s/p CABG and PCI, bilateral carotid artery stenosis, PAD, PAF on Eliquis, COPD, chronic respiratory failure with hypoxia on home oxygen 5 L per minute, IDDM2, s/p renal transplant on immunosuppressant,  urolithiasis, anemia, history of GI bleed,fibromyalgia/chronic pain. ?-Hospitalized 07/25/2021-08/25/2021, for COVID with MRSA pneumonia. 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 ?-Readmitted with angina, NSTEMI 1/17-1/26, heart cath was reattempted 1/20, this was unsuccessful, she was then seen by palliative care, made DNR and discharged home on Imdur, Ranexa, beta-blocker. ?-Readmitted 2/4-2/10 with unstable angina, seen by cardiology again, no good options, palliative care was recommended, subsequently discharged home with hospice services ?-Readmitted with ACS/NSTEMI 2/17-2/19: Seen by palliative care again, decision made for comfort measures and discharged home with hospice ?-Readmitted 3/5-3/8 with acute on chronic systolic CHF, diuresed and discharged home, hospice services resumed ?- back in the ED 4/6 after a fall, also reports worsening shortness of breath, chest x-ray with concern for CHF exacerbation ? ?Subjective: ?-Shoulder hurts, reports worsening of chronic shortness of breath, denies any chest pain today ?-Seems to have very poor understanding of her poor prognosis, seems surprised, although it appears to have been discussed many times in the past ? ?Assessment and Plan: ? ?Acute on chronic hypoxic respite failure  ?Acute on chronic diastolic CHF ?-Clinically do not suspect COPD exacerbation at this time ?-Continue IV Lasix today ?-Conservative  management ?-Palliative to reengage with patient, it appears that she has very poor understanding and acceptance of her poor prognosis and home hospice philosophy and expects medical evaluation and treatment for every symptom ? ?Left shoulder swelling and pain ?-X-ray negative for fracture, dislocation ?-Supportive care, lidocaine patch ?-Continue every 6 hours p.o. morphine ?  ?CAD with chronic angina ?-Frequent admissions with chest pain, non-STEMI, unstable angina ?-2 unsuccessful attempts at cardiac catheterization, per cardiology no further interventions planned, this has been documented in multiple notes in her last few admissions ?-Continue Plavix, Eliquis, metoprolol, Ranexa ? ?Advanced COPD, chronic respiratory failure ?-On 5 L home O2 at baseline, continue nebs ?  ?Hypokalemia ?-Replaced ? ?PAF ?-Sinus rhythm, continue Eliquis. ?  ?History of renal transplant ?-Continue Imuran and she is on high-dose steroid for the COPD ?  ?Cigarette smoking ?-Probably too late to quit to now ?  ?DVT prophylaxis: Eliquis ?Code Status: DNR ?Family Communication: None at bedside ?Disposition Plan ? ? ?Procedures:  ? ?Antimicrobials:  ? ? ?Objective: ?Vitals:  ? 11/21/21 0030 11/21/21 0130 11/21/21 0430 11/21/21 0730  ?BP: (!) 134/94 111/77 101/89 106/71  ?Pulse: (!) 109 91 74 82  ?Resp: (!) 22 19 (!) 22 17  ?Temp:      ?TempSrc:      ?SpO2: 94% 98% 96% 95%  ?Height:      ? ?No intake or output data in the 24 hours ending 11/21/21 1018 ?There were no vitals filed for this visit. ? ?Examination: ? ?General exam: Obese chronically ill female sitting up in bed, AAOx3, no distress, wearing right shoulder sling ?HEENT: Positive JVD ?CVS: S1-S2, regular rhythm ?Lungs: Fine bilateral Rales, no wheezes ?Abdomen: Soft, obese, nontender, bowel sounds present ?Extremities: Trace edema ?Skin: No rashes ?Psychiatry: Mood & affect appropriate.  ? ? ? ?Data Reviewed:  ? ?  CBC: ?Recent Labs  ?Lab 11/20/21 ?1353 11/20/21 ?1946  ?WBC 8.3  --    ?NEUTROABS 5.8  --   ?HGB 10.9* 11.2*  ?HCT 35.5* 33.0*  ?MCV 85.5  --   ?PLT 322  --   ? ?Basic Metabolic Panel: ?Recent Labs  ?Lab 11/20/21 ?1353 11/20/21 ?1946 11/21/21 ?0532  ?NA 133* 133* 134*  ?K 3.2* 3.1* 4.3  ?CL 95*  --  97*  ?CO2 27  --  29  ?GLUCOSE 267*  --  338*  ?BUN 8  --  12  ?CREATININE 0.97  --  1.01*  ?CALCIUM 8.9  --  8.7*  ? ?GFR: ?CrCl cannot be calculated (Unknown ideal weight.). ?Liver Function Tests: ?No results for input(s): AST, ALT, ALKPHOS, BILITOT, PROT, ALBUMIN in the last 168 hours. ?No results for input(s): LIPASE, AMYLASE in the last 168 hours. ?No results for input(s): AMMONIA in the last 168 hours. ?Coagulation Profile: ?No results for input(s): INR, PROTIME in the last 168 hours. ?Cardiac Enzymes: ?No results for input(s): CKTOTAL, CKMB, CKMBINDEX, TROPONINI in the last 168 hours. ?BNP (last 3 results) ?No results for input(s): PROBNP in the last 8760 hours. ?HbA1C: ?No results for input(s): HGBA1C in the last 72 hours. ?CBG: ?Recent Labs  ?Lab 11/20/21 ?2330 11/21/21 ?0810  ?GLUCAP 345* 300*  ? ?Lipid Profile: ?No results for input(s): CHOL, HDL, LDLCALC, TRIG, CHOLHDL, LDLDIRECT in the last 72 hours. ?Thyroid Function Tests: ?No results for input(s): TSH, T4TOTAL, FREET4, T3FREE, THYROIDAB in the last 72 hours. ?Anemia Panel: ?No results for input(s): VITAMINB12, FOLATE, FERRITIN, TIBC, IRON, RETICCTPCT in the last 72 hours. ?Urine analysis: ?   ?Component Value Date/Time  ? COLORURINE YELLOW 09/20/2021 1429  ? APPEARANCEUR CLEAR 09/20/2021 1429  ? LABSPEC 1.015 09/20/2021 1429  ? PHURINE 6.0 09/20/2021 1429  ? GLUCOSEU NEGATIVE 09/20/2021 1429  ? GLUCOSEU 100 (A) 08/13/2020 1550  ? [place] NEGATIVE 09/20/2021 1429  ? [place] NEGATIVE 09/20/2021 1429  ? [place] NEGATIVE 09/20/2021 1429  ? PROTEINUR NEGATIVE 09/20/2021 1429  ? UROBILINOGEN 0.2 08/13/2020 1550  ? NITRITE POSITIVE (A) 09/20/2021 1429  ? LEUKOCYTESUR MODERATE (A) 09/20/2021 1429  ? ?Sepsis  Labs: ?'@LABRCNTIP'$ (procalcitonin:4,lacticidven:4) ? ?)No results found for this or any previous visit (from the past 240 hour(s)).  ? ?Radiology Studies: ?DG Chest 1 View ? ?Result Date: 11/21/2021 ?CLINICAL DATA:  Right shoulder injury last night status post trip and fall. Increased shortness of breath. CHF. EXAM: CHEST  1 VIEW COMPARISON:  11/20/2021 FINDINGS: Postsurgical changes of CABG again seen. Unchanged cardiomegaly. Diffuse bilateral airspace opacities again seen. IMPRESSION: 1. Unchanged diffuse airspace opacities may be due to pulmonary edema or pneumonia. 2. Unchanged cardiomegaly Electronically Signed   By: Miachel Roux M.D.   On: 11/21/2021 07:18  ? ?DG Chest 1 View ? ?Result Date: 11/20/2021 ?CLINICAL DATA:  Fall yesterday EXAM: CHEST  1 VIEW COMPARISON:  10/18/2021 FINDINGS: Cardiac enlargement. Prior CABG. Broken sternal wires are chronic and unchanged Hazy diffuse bilateral airspace disease right greater than left has progressed in the interval. Probable pulmonary edema. No significant pleural effusion. IMPRESSION: Progression of bilateral airspace disease right greater than left. Probable edema. Electronically Signed   By: Franchot Gallo M.D.   On: 11/20/2021 16:02  ? ?DG Shoulder Right ? ?Result Date: 11/20/2021 ?CLINICAL DATA:  Fall.  Right shoulder pain EXAM: RIGHT SHOULDER - 2+ VIEW COMPARISON:  03/30/2021 FINDINGS: Normal alignment.  No fracture or arthropathy Postop CABG. Diffuse bilateral airspace disease. Correlate with chest x-ray IMPRESSION: Negative for  fracture or dislocation. Electronically Signed   By: Franchot Gallo M.D.   On: 11/20/2021 16:00   ? ? ?Scheduled Meds: ? apixaban  5 mg Oral BID  ? azaTHIOprine  125 mg Oral Daily  ? budesonide  2 mL Nebulization BID  ? clopidogrel  75 mg Oral Daily  ? fluticasone  2 spray Each Nare Daily  ? furosemide  40 mg Intravenous BID  ? gabapentin  100 mg Oral QHS  ? insulin aspart  0-20 Units Subcutaneous TID WC  ? insulin aspart  0-5 Units  Subcutaneous QHS  ? insulin glargine-yfgn  20 Units Subcutaneous Daily  ? ipratropium  0.5 mg Nebulization Q6H  ? lamoTRIgine  25 mg Oral Daily  ? lidocaine  1 patch Transdermal Q24H  ? methylPREDNISolone (SOLU-MEDROL) injection  125 mg Intravenous

## 2021-11-21 NOTE — ED Notes (Signed)
Pt awake and complaining of throat and arm pain. Pt also feeling very anxious. Pt provided her scheduled dose of oral Morphine, pt states she will call for RN if pain does not improved.  ? ?Call light and phone within reach.  ?

## 2021-11-21 NOTE — ED Notes (Signed)
Pt rolled and cleaned. New mepilex pad placed on sore on her right buttocks  ?

## 2021-11-21 NOTE — Consult Note (Addendum)
? ?                                                                                ?Consultation Note ?Date: 11/21/2021  ? ?Patient Name: Katherine [name]  ?DOB: 1956/08/01  MRN: [id number]  Age / Sex: 66 y.o., female  ?PCP: [name], MD ?Referring Physician: Domenic Polite, MD ? ?Reason for Consultation: Establishing goals of care ? ?HPI/Patient Profile: 66 y.o. female  with past medical history of severe CAD with recent STEMI with treatment failure on home hospice, chronic angina on ranolazine and morphine with hospice, COPD, chronic hypoxic respiratory failure on 2 L [place] continuously, chronic diastolic CHF, cigarette smoker, PAF on Eliquis, IDDM, history of kidney transplant on Imuran and chronic steroid admitted on 11/20/2021 with shoulder pain and shortness of breath. ? ?Patient has been admitted 6 times in the past 6 months. PMT has been consulted to assist with goals of care conversation.   ? ?Clinical Assessment and Goals of Care: ? ?I have reviewed medical records including EPIC notes, labs and imaging, received report from RN, assessed the patient and then met at the bedside to discuss diagnosis prognosis, GOC, EOL wishes, disposition and options. ? ?I introduced Palliative Medicine as specialized medical care for people living with serious illness. It focuses on providing relief from the symptoms and stress of a serious illness. The goal is to improve quality of life for both the patient and the family. ? ?We discussed a brief life review of the patient and then focused on their current illness. The natural disease trajectory and expectations at EOL were discussed. ? ?Medical History Review and Understanding: ?Patient understands chronic and progressive nature of her illnesses.  ? ?Social History: ?Patient lives at home alone with assistance from her brother. ? ?Functional and Nutritional State: ?Patient is able to ambulate with a walker for only a few steps. Pain with standing and dyspnea with  exertion worsening.  ? ?Palliative Symptoms: ?Dyspnea, pain, anxiety, depression ? ?Advance Directives: ?A detailed discussion regarding advanced directives was had. ? ? ?Code Status: ?Concepts specific to code status, artifical feeding and hydration, and rehospitalization were considered and discussed. ? ?Discussion: ?Eleen shares the immense fear she is feeling as she approaches end of life while living at home alone. She does not want to pursue SNF with hospice and enjoys spending time in her home with her dog. Her main support is her brother and he is having his own medical problems recently. Her brother attempted to call hospice when Charmane began suffering from pain and dyspnea, however she then called EMS due to worsening fear and anxiety. There have been many deaths in her family lately and she speaks of her [place] while also acknowledging the uncertainty of death is overwhelming. We discussed her priorities and what is most important to her. She finds [place] hospice staff to be a Network engineer and appreciates being able to continue her medications for her heart. She would like to pass peacefully in her own home when it is her time. She does not want to lose this hospice support,  as this has been an issue in the past with previous admissions. Counseled  on hospice philosophy and hospital interventions not aligned with this. Ultimately she still wishes to pursue hospitalization for pain management and would like to continue with home hospice once discharged.  ? ? ?Discussed the importance of continued conversation with family and the medical providers regarding overall plan of care and treatment options, ensuring decisions are within the context of the patient?s values and GOCs.  ? ?Questions and concerns were addressed. The patient was encouraged to call with questions or concerns.  PMT will continue to support holistically.  ? ?  ? ?SUMMARY OF RECOMMENDATIONS   ?-DNR ?-Patient's goal is pain  management and return home with [facility] hospice ?-Psychosocial and emotional support provided ?-PMT will continue to follow ? ? ?Prognosis:  ?Hospice appropriate  ? ?Discharge Planning: Home with Hospice  ? ?  ? ?Primary Diagnoses: ?Present on Admission: ? COPD (chronic obstructive pulmonary disease) ([place]) ? ? ?I have reviewed the medical record, interviewed the patient and family, and examined the patient. The following aspects are pertinent. ? ?Past Medical History:  ?Diagnosis Date  ? Anemia   ? Anxiety   ? Bilateral carotid artery stenosis   ? Carotid duplex 11/4965: 5-91% LICA, 63-84% RICA, >66% RECA, f/u 1 yr suggested  ? CAD (coronary artery disease) of bypass graft 5/01; 3/'02, 8/'03, 10/'04; 1/15  ? PCI x 5 to SVG-D1   ? CAD in native artery 07/1993  ? 3 Vessel Disease (LAD-D1 & RCA) -- CABG (Dx in setting of inferior STEMI-PTCA of RCA)  ? COPD mixed type ([place])   ? Followed by Dr. Lamonte Sakai "pulmonologist said no COPD"  ? Depression with anxiety   ? Diabetes mellitus type 2 in obese [facility])   ? Diarrhea   ? started after cholecystectomy and mass removed from intestine  ? Dyslipidemia, goal LDL below 70   ? 08/2012: TC 137, TG 200, HDL 32!, LDL 45; on statin (followed by Dr.Deterding)  ? ESRD (end stage renal disease) ([place]) 1991  ? s/p Cadaveric Renal Transplant [facility] - Dr. Jimmy Footman)   ? Family history of adverse reaction to anesthesia   ? mom's bp dropped during/after anesthesia  ? Fibromyalgia   ? GERD (gastroesophageal reflux disease)   ? H/O: GI bleed   ? History of kidney stones   ? History of stroke 2012  ? "right eye stroke- half blind now"  ? 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 Witnessed syncopal event.  Had having having lots of nausea and vomiting with poor p.o. intake.  Thought to have QT prolongation with multiple medications involved and hypomagnesemia, hypokalemia.  Tikosyn discontinued along with Zoloft and Phenergan.  ? Hypertension associated with diabetes ([place])   ? Mild aortic  stenosis by prior echocardiogram 07/2019  ? Echo:  Mild aortic stenosis (gradients: Mean 14.3 mmHg -peak 24.9 mmHg).  ? Morbid obesity ([place])   ? MRSA (methicillin resistant staph aureus) culture positive   ? OSA (obstructive sleep apnea)   ? no longer on CPAP or home O2, states she doesn't need now after lap band  ? PAD (peripheral artery disease) ([place]) 08/2013  ? LEA Dopplers to be read by Dr. Fletcher Anon  ? PAF (paroxysmal atrial fibrillation) ([place]) 06/2014  ? Noted on CardioNet Monitor  - --> rhythm control with Tikosyn (Dr. Rayann Heman); converted from warfarin to apixaban for anticoagulation.  ? Pneumonia   ? Recurrent boils   ? Bilateral Groin  ? Rheumatoid arthritis ([place])   ? Per Patient Report; associated with  OA  ? S/p cadaver renal transplant 1991  ? [place]  ? ?Social History  ? ?Socioeconomic History  ? Marital status: Widowed  ?  Spouse name: Not on file  ? Number of children: Not on file  ? Years of education: Not on file  ? Highest education level: Not on file  ?Occupational History  ? Not on file  ?Tobacco Use  ? Smoking status: Every Day  ?  Packs/day: 1.00  ?  Years: 30.00  ?  Pack years: 30.00  ?  Types: Cigarettes  ?  Last attempt to quit: 08/17/2002  ?  Years since quitting: 19.2  ? Smokeless tobacco: Never  ?Vaping Use  ? Vaping Use: Never used  ?Substance and Sexual Activity  ? Alcohol use: No  ? Drug use: No  ? Sexual activity: Not on file  ?Other Topics Concern  ? Not on file  ?Social History Narrative  ? ** Merged History Encounter ** She is currently married, and the caregiver of her husband who is recovering from surgery for tongue cancer now diagnosed with lung cancer. Prior to his diagnosis of her husband, she actually had adopted a 62-year-old child who she knows caring for as well. With all the surgeries, they have been quite financially troubled. Thanks the help of her community and church, they have been able to stay "alfoat."    ? She is a former smoker who quit in 2004 after a 30-pack-year  history.  ? She is active chasing a 55-year-old child, does not do routine exercise. She's been quite depressed with the condition of her husband, and admits to eating comfort herself.  ? She does not drink alcoho

## 2021-11-21 NOTE — ED Notes (Signed)
Report handed off to [facility].  ?

## 2021-11-21 NOTE — Progress Notes (Signed)
Heart Failure Navigator Progress Note ? ?Assessed for Heart & Vascular TOC clinic readiness.  ?Patient does not meet criteria due to on home Hospice.  ? ? ? ?Earnestine Leys, BSN, RN ?Heart Failure Nurse Navigator ?Secure Chat Only  ?

## 2021-11-22 DIAGNOSIS — J438 Other emphysema: Secondary | ICD-10-CM | POA: Diagnosis not present

## 2021-11-22 LAB — GLUCOSE, CAPILLARY
Glucose-Capillary: 208 mg/dL — ABNORMAL HIGH (ref 70–99)
Glucose-Capillary: 295 mg/dL — ABNORMAL HIGH (ref 70–99)
Glucose-Capillary: 129 mg/dL — ABNORMAL HIGH (ref 70–99)

## 2021-11-22 LAB — BASIC METABOLIC PANEL
Anion gap: 8 (ref 5–15)
BUN: 19 mg/dL (ref 8–23)
CO2: 29 mmol/L (ref 22–32)
Calcium: 8.9 mg/dL (ref 8.9–10.3)
Chloride: 94 mmol/L — ABNORMAL LOW (ref 98–111)
Creatinine, Ser: 1.23 mg/dL — ABNORMAL HIGH (ref 0.44–1.00)
GFR, Estimated: 49 mL/min — ABNORMAL LOW (ref >60)
Glucose, Bld: 252 mg/dL — ABNORMAL HIGH (ref 70–99)
Potassium: 4.3 mmol/L (ref 3.5–5.1)
Sodium: 131 mmol/L — ABNORMAL LOW (ref 135–145)

## 2021-11-22 NOTE — Discharge Summary (Signed)
Physician Discharge Summary  ?Katherine [name] AYO:[id number] DOB: 06-Jul-1956 DOA: 11/20/2021 ? ?PCP: Katherine Lima, MD ? ?Admit date: 11/20/2021 ?Discharge date: 11/22/2021 ? ?Admitted From: Home ?Disposition: Home with home hospice ? ?Recommendations for Outpatient Follow-up:  ?As per home hospice provider ? ?Home Health: Already in place ?Equipment/Devices: Already available ? ?Discharge Condition: Fair ?CODE STATUS: DNR ?Diet recommendation: Low-salt and low-carb diet ? ?Discharge summary: ? ?23/T w/ complicated medical history, h/o CAD s/p CABG and PCI, bilateral carotid artery stenosis, PAD, PAF on Eliquis, COPD, chronic respiratory failure with hypoxia on home oxygen 5 L per minute, IDDM2, s/p renal transplant on immunosuppressant,  urolithiasis, anemia, history of GI bleed,fibromyalgia/chronic pain. ? ?-Hospitalized 07/25/2021-08/25/2021, for COVID with MRSA pneumonia. 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 ?-Readmitted with angina, NSTEMI 1/17-1/26, heart cath was reattempted 1/20, this was unsuccessful, she was then seen by palliative care, made DNR and discharged home on Imdur, Ranexa, beta-blocker. ?-Readmitted 2/4-2/10 with unstable angina, seen by cardiology again, no good options, palliative care was recommended, subsequently discharged home with hospice services ?-Readmitted with ACS/NSTEMI 2/17-2/19: Seen by palliative care again, decision made for comfort measures and discharged home with hospice ?-Readmitted 3/5-3/8 with acute on chronic systolic CHF, diuresed and discharged home, hospice services resumed ?- back in the ED 4/6 after a fall, also reports worsening shortness of breath, chest x-ray with concern for CHF exacerbation.  ?Patient's brother was calling hospice, patient got panicked she thought she broke her arm and  hospice will not do x-ray so she called EMS and came to the hospital. ? ?Acute on chronic hypoxemic respiratory failure, multifactorial ?Shoulder pain and swelling, no bony injury, soft tissue injury ?Coronary artery disease with chronic angina, untreatable coronary obstructions on Plavix, Eliquis, metoprolol and Ranexa. ?Severe COPD and chronic hypoxemic respiratory failure on 5 L at home. ?Paroxysmal A-fib, currently in sinus rhythm and therapeutic on Eliquis. ?History of renal transplant, on Imuran and prednisone.  Renal function is stable. ?Ongoing nicotine use, plenty of terminal disease.  Not reversible with the smoking cessation now. ? ?Patient was admitted to the hospital and treated symptomatically with bronchodilator therapy, IV diuresis.  A skeletal survey was negative.  She was treated with morphine as she was doing.  Patient has extensive medical issues, appropriate for home hospice program which she is already enrolled in.  Fairly stable to transition home today.  She will resume all her home medications. ? ?Patient has prescription for morphine sulfate solution, however she also has prescription for oxycodone 10 mg every 6 hours.  Patient would prefer to take oxycodone, I discontinued her morphine prescription.  She also has multiple symptom control medications, benzodiazepines available at home. ?Patient lives at home with her brother.  She would like to live at home and feels that she has adequate support system at home.  She loves her dog and does not want to be apart from him. ?Since patient has well-controlled symptoms today, she will go home and resume hospice services. ?Hopefully, her symptoms could be managed at home by hospice services and she can avoid distress of hospitalization in the future. ?  ? ? ?Discharge Diagnoses:  ?Principal Problem: ?  COPD (chronic obstructive pulmonary disease) ([place]) ? ? ? ?Discharge Instructions ? ?Discharge Instructions   ? ? Diet - low sodium heart healthy  Complete by: As directed ?  ? Diet Carb Modified   Complete by: As directed ?  ? Increase activity slowly   Complete by: As directed ?  ? No wound care   Complete by: As directed ?  ? ?  ? ?Allergies as of 11/22/2021   ? ?   Reactions  ? Tetracycline Hives  ? Patient tolerated Doxycycline Dec 2020  ? Niacin Other (See Comments)  ? Mouth blisters  ? Niaspan [niacin Er] Other (See Comments)  ? Mouth blisters  ? Sulfa Antibiotics Nausea Only, Other (See Comments)  ? "Tears up stomach"  ? Sulfonamide Derivatives Other (See Comments)  ? Reaction: per patient "tears her stomach up"  ? Codeine Nausea And Vomiting  ? Erythromycin Nausea And Vomiting  ? Hydromorphone Hcl Nausea And Vomiting  ? Morphine And Related Nausea And Vomiting  ? Nalbuphine Nausea And Vomiting  ? Nubain  ? Sulfasalazine Nausea Only, Other (See Comments)  ? per patient "tears her stomach up", "Tears up stomach"  ? Tape Rash, Other (See Comments)  ? No "plastic" tape," please----cloth tape only  ? ?  ? ?  ?Medication List  ?  ? ?STOP taking these medications   ? ?docusate sodium 100 MG capsule ?Commonly known as: COLACE ?  ?morphine CONCENTRATE 10 MG/0.5ML Soln concentrated solution ?  ?ondansetron 4 MG disintegrating tablet ?Commonly known as: ZOFRAN-ODT ?  ?polyethylene glycol 17 g packet ?Commonly known as: MIRALAX / GLYCOLAX ?  ? ?  ? ?TAKE these medications   ? ?albuterol 108 (90 Base) MCG/ACT inhaler ?Commonly known as: VENTOLIN HFA ?TAKE 2 PUFFS BY MOUTH EVERY 6 HOURS AS NEEDED FOR WHEEZE OR SHORTNESS OF BREATH ?What changed: See the new instructions. ?  ?apixaban 5 MG Tabs tablet ?Commonly known as: ELIQUIS ?Take 1 tablet (5 mg total) by mouth 2 (two) times daily. ?  ?arformoterol 15 MCG/2ML Nebu ?Commonly known as: BROVANA ?Take 2 mLs (15 mcg total) by nebulization 2 (two) times daily. ?  ?azaTHIOprine 50 MG tablet ?Commonly known as: IMURAN ?Take 2 & 1/2 tablets (125 mg) by mouth daily at 3pm ?What changed: when to take this ?  ?Basaglar KwikPen  100 UNIT/ML ?Inject 20 Units into the skin daily. ?  ?bisacodyl 5 MG EC tablet ?Commonly known as: DULCOLAX ?Take 1 tablet (5 mg total) by mouth daily as needed for moderate constipation. ?  ?budesonide 0.5 MG/2ML nebulizer solution ?Commonly known as: PULMICORT ?Take 2 mLs (0.5 mg total) by nebulization 2 (two) times daily. ?What changed:  ?when to take this ?reasons to take this ?  ?calcitRIOL 0.25 MCG capsule ?Commonly known as: ROCALTROL ?Take 0.25 mcg by mouth See admin instructions. Takes every 3 days. ?  ?clopidogrel 75 MG tablet ?Commonly known as: PLAVIX ?Take 75 mg by mouth daily. ?  ?cyclobenzaprine 10 MG tablet ?Commonly known as: FLEXERIL ?Take 1 tablet (10 mg total) by mouth 3 (three) times daily as needed for muscle spasms. ?  ?fluticasone 50 MCG/ACT nasal spray ?Commonly known as: FLONASE ?PLACE 2 SPRAYS INTO BOTH NOSTRILS 2 TIMES DAILY. ?What changed: See the new instructions. ?  ?furosemide 40 MG tablet ?Commonly known as: LASIX ?Take 40 mg by mouth daily. ?  ?gabapentin 100 MG capsule ?Commonly known as: NEURONTIN ?Take 1 capsule (100 mg total) by mouth at bedtime. ?  ?glimepiride 4 MG tablet ?Commonly known as: AMARYL ?Take 4 mg by mouth daily with breakfast. ?  ?isosorbide mononitrate 60 MG 24 hr tablet ?Commonly known as:  IMDUR ?Take 1 tablet (60 mg total) by mouth daily. ?  ?lamoTRIgine 25 MG tablet ?Commonly known as: LAMICTAL ?TAKE 1 TABLET BY MOUTH EVERYDAY AT BEDTIME ?What changed: See the new instructions. ?  ?LORazepam 1 MG tablet ?Commonly known as: ATIVAN ?Take 1 mg by mouth 2 (two) times daily as needed for anxiety. ?What changed: Another medication with the same name was removed. Continue taking this medication, and follow the directions you see here. ?  ?LORazepam 2 MG/ML concentrated solution ?Commonly known as: ATIVAN ?Place 0.5 mLs (1 mg total) under the tongue every hour as needed for anxiety, seizure, sedation or sleep (distress). ?What changed: Another medication with the same  name was removed. Continue taking this medication, and follow the directions you see here. ?  ?metoprolol tartrate 25 MG tablet ?Commonly known as: LOPRESSOR ?Take 1 tablet (25 mg total) by mouth 3 (t

## 2021-11-22 NOTE — Progress Notes (Signed)
Pt has DC order. AVS was explained and given to pt. PTAR was set-up, pending pick-up.  ?

## 2021-11-22 NOTE — Plan of Care (Signed)
  Problem: Education: Goal: Knowledge of General Education information will improve Description: Including pain rating scale, medication(s)/side effects and non-pharmacologic comfort measures Outcome: Progressing   Problem: Health Behavior/Discharge Planning: Goal: Ability to manage health-related needs will improve Outcome: Not Progressing   

## 2021-11-22 NOTE — Progress Notes (Signed)
Nutrition Brief Note ? ?RD consulted for assess of nutritional requirements/ status.  ? ?Wt Readings from Last 15 Encounters:  ?11/22/21 84.9 kg  ?10/22/21 85.5 kg  ?10/03/21 90 kg  ?09/24/21 89.4 kg  ?09/11/21 91.7 kg  ?09/02/21 92.3 kg  ?08/25/21 88.9 kg  ?05/18/21 96 kg  ?04/22/21 97 kg  ?04/12/21 90.7 kg  ?03/21/21 97.5 kg  ?03/12/21 105.7 kg  ?02/20/21 105.8 kg  ?02/03/21 103.7 kg  ?12/25/20 95.3 kg  ? ?Katherine [name] is a 66 y.o. female with medical history significant of severe CAD with recent STEMI with treatment failure on home hospice, chronic angina on ranolazine and morphine with hospice, COPD, chronic hypoxic respiratory failure on 2 L [place] continuously, chronic diastolic CHF, cigarette smoker, PAF on Eliquis, IDDM, history of kidney transplant on Imuran and chronic steroid, presented with new onset of wheezing cough shortness of breath. ? ?Pt admitted with CHF, COPD exacerbation. ? ?Reviewed I/O's:+240 ml x 24 hours ? ?UOP: 500 ml x 24 hours ? ?Pt unavailable at time of visit. Attempted to speak with pt via call to hospital room phone, however, unable to reach. RD unable to obtain further nutrition-related history or complete nutrition-focused physical exam at this time.   ? ?Per MD, pt will discharge home with hospice today. ? ?Lab Results  ?Component Value Date  ? HGBA1C 7.8 (H) 10/20/2021  ? PTA DM medications are 4 mg amaryl daily, 4-12 units insulin aspart TID with meals, and 20 units insulin glargine daily.  ? ?Labs reviewed: Na: 131, Mg: 1.6, CBGS: 208-295 (inpatient orders for glycemic control are 0-20 units insulin aspart TID with meals, 0-5 units insulin aspart daily at bedtime, and 20 units insulin glargine-yfgn daily).   ? ?Current diet order is Heart Healthy, patient is consuming approximately 50% of meals at this time. Labs and medications reviewed.  ? ?No nutrition interventions warranted at this time. If nutrition issues arise, please consult RD.  ? ?Loistine Chance, RD, LDN,  CDCES ?Registered Dietitian II ?Certified Diabetes Care and Education Specialist ?Please refer to [facility] for RD and/or RD on-call/weekend/after hours pager   ?

## 2021-11-22 NOTE — Evaluation (Signed)
Physical Therapy Evaluation ?Patient Details ?Name: Katherine [name] ?MRN: [id number] ?DOB: August 28, 1955 ?Today's Date: 11/22/2021 ? ?History of Present Illness ? Pt is a 66 y.o. female admitted 4/6 following a fall at home hitting her RUE on door. Admitted for COPD. RUE xray negative. Multiple recent hospitalizations. Pt is from home with hospice. NWG:NFAOZH, ESRD s/p renal transplant on chronic immunosuppression, CAD s/p CABG and multiple PCI most recently DES to SVG 14 February 2021, PAF on Eliquis, chronic diastolic CHF, COPD, chronic respiratory failure with hypoxia on 5L supplemental O2 via [place], insulin-dependent T2DM, HTN, HLD, depression/anxiety, chronic pain ?  ?Clinical Impression ? PT eval complete. Pt required min assist bed mobility, and min assist transfers. SpO2 in 90s on 5L. Plan is for d/c home today with home hospice. Pt reports her brother lives in the home and is able to provide 24-hour assist. Pt has all needed DME. PT signing off.   ?   ? ?Recommendations for follow up therapy are one component of a multi-disciplinary discharge planning process, led by the attending physician.  Recommendations may be updated based on patient status, additional functional criteria and insurance authorization. ? ?Follow Up Recommendations No PT follow up (Pt discharging home with hospice.) ? ?  ?Assistance Recommended at Discharge Frequent or constant Supervision/Assistance  ?Patient can return home with the following ?   ? ?  ?Equipment Recommendations None recommended by PT (Pt has all needed DME.)  ?Recommendations for Other Services ?    ?  ?Functional Status Assessment    ? ?  ?Precautions / Restrictions Precautions ?Precautions: Fall  ? ?  ? ?Mobility ? Bed Mobility ?Overal bed mobility: Needs Assistance ?Bed Mobility: Supine to Sit, Sit to Supine ?  ?  ?Supine to sit: Min assist, HOB elevated ?Sit to supine: Min assist ?  ?General bed mobility comments: +rail, increased time, assist to elevate trunk, assist with BLE  back to bed ?  ? ?Transfers ?Overall transfer level: Needs assistance ?Equipment used: None ?Transfers: Sit to/from Stand, Bed to chair/wheelchair/BSC ?Sit to Stand: Min assist ?  ?Step pivot transfers: Min assist ?  ?  ?  ?General transfer comment: assist to power up, assist to stabilize balance for pivot steps bed <> BSC ?  ? ?Ambulation/Gait ?  ?  ?  ?  ?  ?  ?  ?  ? ?Stairs ?  ?  ?  ?  ?  ? ?Wheelchair Mobility ?  ? ?Modified Rankin (Stroke Patients Only) ?  ? ?  ? ?Balance Overall balance assessment: Needs assistance ?Sitting-balance support: No upper extremity supported, Feet supported ?Sitting balance-Leahy Scale: Good ?  ?  ?Standing balance support: Bilateral upper extremity supported, During functional activity ?Standing balance-Leahy Scale: Poor ?Standing balance comment: reliant on external support ?  ?  ?  ?  ?  ?  ?  ?  ?  ?  ?  ?   ? ? ? ?Pertinent Vitals/Pain Pain Assessment ?Pain Assessment: Faces ?Faces Pain Scale: Hurts little more ?Pain Location: generalized with mobility ?Pain Descriptors / Indicators: Discomfort ?Pain Intervention(s): Monitored during session, Repositioned, Limited activity within patient's tolerance  ? ? ?Home Living Family/patient expects to be discharged to:: Private residence ?Living Arrangements: Other relatives (brother) ?Available Help at Discharge: Family;Available 24 hours/day ?Type of Home: House ?Home Access: Stairs to enter ?Entrance Stairs-Rails: Left ?Entrance Stairs-Number of Steps: 5 ?  ?Home Layout: One level ?Home Equipment: Rollator (4 wheels);Cane - single point;Tub bench;Grab bars - toilet;Wheelchair - [employer]  bed ?Additional Comments: furniture walks around house. 5L O2 at baseline.  ?  ?Prior Function Prior Level of Function : Needs assist ?  ?  ?  ?Physical Assist : Mobility (physical);ADLs (physical) ?  ?  ?Mobility Comments: rollator for very short household distances. Brother helps as needed ?ADLs Comments: Brother assists as needed for ADLs.  Home hospice as well. ?  ? ? ?Hand Dominance  ? Dominant Hand: Right ? ?  ?Extremity/Trunk Assessment  ? Upper Extremity Assessment ?Upper Extremity Assessment: Generalized weakness ?  ? ?Lower Extremity Assessment ?Lower Extremity Assessment: Generalized weakness ?  ? ?Cervical / Trunk Assessment ?Cervical / Trunk Assessment: Kyphotic  ?Communication  ? Communication: No difficulties  ?Cognition Arousal/Alertness: Awake/alert ?Behavior During Therapy: [facility] for tasks assessed/performed ?Overall Cognitive Status: Within Functional Limits for tasks assessed ?  ?  ?  ?  ?  ?  ?  ?  ?  ?  ?  ?  ?  ?  ?  ?  ?  ?  ?  ? ?  ?General Comments General comments (skin integrity, edema, etc.): SpO2 in 90s on 5L ? ?  ?Exercises    ? ?Assessment/Plan  ?  ?PT Assessment Patient does not need any further PT services  ?PT Problem List   ? ?   ?  ?PT Treatment Interventions     ? ?PT Goals (Current goals can be found in the Care Plan section)  ?Acute Rehab PT Goals ?Patient Stated Goal: home, comfort ?PT Goal Formulation: All assessment and education complete, DC therapy ? ?  ?Frequency   ?  ? ? ?Co-evaluation   ?  ?  ?  ?  ? ? ?  ?AM-PAC PT "6 Clicks" Mobility  ?Outcome Measure Help needed turning from your back to your side while in a flat bed without using bedrails?: A Little ?Help needed moving from lying on your back to sitting on the side of a flat bed without using bedrails?: A Little ?Help needed moving to and from a bed to a chair (including a wheelchair)?: A Little ?Help needed standing up from a chair using your arms (e.g., wheelchair or bedside chair)?: A Little ?Help needed to walk in hospital room?: A Little ?Help needed climbing 3-5 steps with a railing? : A Lot ?6 Click Score: 17 ? ?  ?End of Session Equipment Utilized During Treatment: Gait belt;Oxygen ?Activity Tolerance: Patient tolerated treatment well ?Patient left: in bed;with call bell/phone within reach ?Nurse Communication: Mobility status ?PT Visit Diagnosis:  Muscle weakness (generalized) (M62.81);Difficulty in walking, not elsewhere classified (R26.2) ?  ? ?Time: 8101-7510 ?PT Time Calculation (min) (ACUTE ONLY): 29 min ? ? ?Charges:   PT Evaluation ?$PT Eval Moderate Complexity: 1 Mod ?PT Treatments ?$Therapeutic Activity: 8-22 mins ?  ?   ? ? ?Lorrin Goodell, PT  ?Office # [phone] ?Pager [phone] ? ? ?Lorriane Shire ?11/22/2021, 3:16 PM ? ?

## 2021-11-22 NOTE — TOC Transition Note (Addendum)
Transition of Care (TOC) - CM/SW Discharge Note ? ? ?Patient Details  ?Name: Katherine [name] ?MRN: [id number] ?Date of Birth: Sep 13, 1955 ? ?Transition of Care (TOC) CM/SW Contact:  ?Carles Collet, RN ?Phone Number: ?11/22/2021, 11:08 AM ? ? ?Clinical Narrative:    ?Spoke w patient and discussed the H Hospice and DC plan. ?Patient active w [facility] prior to admission. Left VM w Anghela, liaison, awaiting call back, also called main office to notify of DC.  ?Patient will need PTAR set up. Verified address and that her brother will be home to let her in the house ?Papers on chart including DNR.  ?Patient has home oxygen at home, and all needed DME  ? ?Spoke w nurse and CM will call PTAR when DC order placed ? ? ?Notified by [facility] that they are [place] patient from services. [facility] liaison Anghela states that patient has all needed medications at home and they will not remove DME from the home, earliest DME removal would be Monday.  ? ?Spoke w patient and she is agreeable to [facility] referral who is able to accept per [employer]. [place] liaison aware of situation w [facility] and that DME exchange will not be needed over the weekend.  ? ?Final next level of care: [place] ?Barriers to Discharge: No Barriers Identified ? ? ?Patient Goals and CMS Choice ?Patient states their goals for this hospitalization and ongoing recovery are:: to go home and resume home hospice ?CMS Medicare.gov Compare Post Acute Care list provided to:: Patient ?Choice offered to / list presented to : Patient ? ?Discharge Placement ?  ?           ?  ?  ?  ?  ? ?Discharge Plan and Services ?  ?  ?           ?  ?  ?  ?  ?  ?  ?  ?Date HH Agency Contacted: 11/22/21 ?Time [place]: [id number] ?Representative spoke with at [place]: [facility] ? ?Social Determinants of Health (SDOH) Interventions ?  ? ? ?Readmission Risk Interventions ? ?  09/23/2021  ?  4:39 PM 09/08/2021  ?  4:00 PM 05/14/2021  ?  1:12 PM   ?Readmission Risk Prevention Plan  ?Transportation Screening Complete Complete Complete  ?Medication Review Press photographer) Complete Complete Complete  ?PCP or Specialist appointment within 3-5 days of discharge Complete Complete Complete  ?[place] or Home Care Consult Complete Complete Complete  ?SW Recovery Care/Counseling Consult Complete Complete Complete  ?Palliative Care Screening Complete Not Applicable Not Applicable  ?[place] Not Applicable Not Applicable Not Applicable  ? ? ? ? ? ?

## 2021-11-22 NOTE — Progress Notes (Signed)
OT Cancellation Note ? ?Patient Details ?Name: Katherine [name] ?MRN: [id number] ?DOB: 1955-12-17 ? ? ?Cancelled Treatment:    Reason Eval/Treat Not Completed: Other (comment) (Pt with active d/c plans to go home with hospice today. OT evaluation to re-assess if pt's LOS should continue.) ? ?Abron Neddo A Naziah Weckerly ?11/22/2021, 1:34 PM ?

## 2021-11-22 NOTE — Progress Notes (Signed)
Manufacturing engineer [facility]) Hospital Liaison Note ? ?Referral received for patient/family interest in Hospice at Home. [place] liaison spoke with patient's niece [name]. Interest confirmed. ? ?Hospice eligibility pending.  ? ?DME in the home: hospital bed, oxygen ? ?DME needs: over the bed table ? ?Plan is to discharge home today via [place].  ? ?Please send comfort prescriptions/medications home with patient at discharge.  ? ?Please call with any questions or concerns. Thank you ? ?Roselee Nova, LCSW ?[facility] Liaison ?[phone] ?

## 2021-11-25 ENCOUNTER — Telehealth: Payer: Self-pay | Admitting: Internal Medicine

## 2021-11-25 NOTE — Telephone Encounter (Signed)
Pt has been scheduled for 4/20 @ 2.20pm.  ?

## 2021-11-25 NOTE — Telephone Encounter (Signed)
Pt requesting a call back from assistant.  ? ?States she needs meds filled and also needs MD to look at a wound on R heel.  ? ?Offered an appt, pt did not want to schedule.  ?

## 2021-11-28 ENCOUNTER — Telehealth: Payer: Self-pay

## 2021-11-28 NOTE — Telephone Encounter (Signed)
Pt has called requesting that PCP takes over her oxycodone. She wants it sent today.  ? ?I have informed her since that is an Rx that PCP has not previously filled for her that she would need to discuss during her upcoming [place] on Th 4/20. However, pt still requested that I sent a message in regard.  ?

## 2021-11-29 ENCOUNTER — Other Ambulatory Visit: Payer: Self-pay | Admitting: Physical Medicine & Rehabilitation

## 2021-11-29 DIAGNOSIS — M47816 Spondylosis without myelopathy or radiculopathy, lumbar region: Secondary | ICD-10-CM

## 2021-12-01 ENCOUNTER — Other Ambulatory Visit: Payer: Self-pay | Admitting: Internal Medicine

## 2021-12-01 DIAGNOSIS — Z515 Encounter for palliative care: Secondary | ICD-10-CM

## 2021-12-01 DIAGNOSIS — F411 Generalized anxiety disorder: Secondary | ICD-10-CM

## 2021-12-01 DIAGNOSIS — M1711 Unilateral primary osteoarthritis, right knee: Secondary | ICD-10-CM

## 2021-12-01 MED ORDER — OXYCODONE HCL 10 MG PO TABS: 10.0000 mg | ORAL_TABLET | Freq: Four times a day (QID) | ORAL | 0 refills | Status: DC | PRN

## 2021-12-01 MED ORDER — LORAZEPAM 2 MG/ML PO CONC: 1.0000 mg | Freq: Three times a day (TID) | ORAL | 1 refills | Status: DC | PRN

## 2021-12-01 NOTE — Telephone Encounter (Signed)
Pt wants to tell Dr. Ronnald Ramp thank you so much for being there as the [facility] motto says We're right here with you! ? ?FYI ?

## 2021-12-01 NOTE — Telephone Encounter (Signed)
Pt calling in crying stating she is unable to physically make it down her stairs to come to ov on 4-20 ? ?Pt states she needs her pain and anxiety meds ? ?Informed pt of cmas previous mssg and ov is needed ? ?Pt states she "doesn't know what to do" and requesting a cb  ? ?Please advise ?

## 2021-12-03 NOTE — Telephone Encounter (Signed)
Pt wanted suggestions on how she could get to her appointment. I recommended she call the 911 non-emergent line to see if the would be able to assist with getting her steps since she has mobility issues. She expressed understanding and would give them a call.  ?

## 2021-12-03 NOTE — Telephone Encounter (Signed)
Pt states she is unable to make it to her appt tomorrow for the reasons stated below ? ?Pt requested vv w/ provider, informed pt provider does not do vv ? ?Pt inquired what should she do, advised pt for future refills, she will need an ov ? ?Pt requesting a cb regarding appt  ?

## 2021-12-04 ENCOUNTER — Ambulatory Visit: Payer: Medicare Other | Admitting: Internal Medicine

## 2021-12-07 ENCOUNTER — Encounter (HOSPITAL_COMMUNITY): Payer: Self-pay | Admitting: Emergency Medicine

## 2021-12-07 ENCOUNTER — Other Ambulatory Visit: Payer: Self-pay

## 2021-12-07 ENCOUNTER — Emergency Department (HOSPITAL_COMMUNITY): Payer: Medicare Other

## 2021-12-07 ENCOUNTER — Inpatient Hospital Stay (HOSPITAL_COMMUNITY)
Admission: EM | Admit: 2021-12-07 | Discharge: 2021-12-13 | DRG: 189 | Disposition: A | Discharge status: Home Health | Payer: Medicare Other | Attending: Family Medicine | Admitting: Family Medicine

## 2021-12-07 DIAGNOSIS — E785 Hyperlipidemia, unspecified: Secondary | ICD-10-CM | POA: Diagnosis not present

## 2021-12-07 DIAGNOSIS — E1165 Type 2 diabetes mellitus with hyperglycemia: Secondary | ICD-10-CM | POA: Diagnosis present

## 2021-12-07 DIAGNOSIS — I5023 Acute on chronic systolic (congestive) heart failure: Secondary | ICD-10-CM | POA: Diagnosis not present

## 2021-12-07 DIAGNOSIS — Z7401 Bed confinement status: Secondary | ICD-10-CM

## 2021-12-07 DIAGNOSIS — I6521 Occlusion and stenosis of right carotid artery: Secondary | ICD-10-CM | POA: Diagnosis present

## 2021-12-07 DIAGNOSIS — Z9884 Bariatric surgery status: Secondary | ICD-10-CM

## 2021-12-07 DIAGNOSIS — E871 Hypo-osmolality and hyponatremia: Secondary | ICD-10-CM | POA: Diagnosis not present

## 2021-12-07 DIAGNOSIS — I509 Heart failure, unspecified: Secondary | ICD-10-CM

## 2021-12-07 DIAGNOSIS — Z515 Encounter for palliative care: Secondary | ICD-10-CM

## 2021-12-07 DIAGNOSIS — Z8673 Personal history of transient ischemic attack (TIA), and cerebral infarction without residual deficits: Secondary | ICD-10-CM

## 2021-12-07 DIAGNOSIS — Z7902 Long term (current) use of antithrombotics/antiplatelets: Secondary | ICD-10-CM

## 2021-12-07 DIAGNOSIS — I5032 Chronic diastolic (congestive) heart failure: Secondary | ICD-10-CM | POA: Diagnosis not present

## 2021-12-07 DIAGNOSIS — Z8614 Personal history of Methicillin resistant Staphylococcus aureus infection: Secondary | ICD-10-CM

## 2021-12-07 DIAGNOSIS — Z794 Long term (current) use of insulin: Secondary | ICD-10-CM

## 2021-12-07 DIAGNOSIS — R0602 Shortness of breath: Secondary | ICD-10-CM | POA: Diagnosis not present

## 2021-12-07 DIAGNOSIS — J9621 Acute and chronic respiratory failure with hypoxia: Secondary | ICD-10-CM | POA: Diagnosis not present

## 2021-12-07 DIAGNOSIS — I2510 Atherosclerotic heart disease of native coronary artery without angina pectoris: Secondary | ICD-10-CM | POA: Diagnosis not present

## 2021-12-07 DIAGNOSIS — Z8616 Personal history of COVID-19: Secondary | ICD-10-CM

## 2021-12-07 DIAGNOSIS — E1169 Type 2 diabetes mellitus with other specified complication: Secondary | ICD-10-CM | POA: Diagnosis present

## 2021-12-07 DIAGNOSIS — Z9861 Coronary angioplasty status: Secondary | ICD-10-CM | POA: Diagnosis not present

## 2021-12-07 DIAGNOSIS — Z885 Allergy status to narcotic agent status: Secondary | ICD-10-CM

## 2021-12-07 DIAGNOSIS — D649 Anemia, unspecified: Secondary | ICD-10-CM | POA: Diagnosis present

## 2021-12-07 DIAGNOSIS — J80 Acute respiratory distress syndrome: Secondary | ICD-10-CM | POA: Diagnosis not present

## 2021-12-07 DIAGNOSIS — R0902 Hypoxemia: Secondary | ICD-10-CM | POA: Diagnosis not present

## 2021-12-07 DIAGNOSIS — Y92239 Unspecified place in hospital as the place of occurrence of the external cause: Secondary | ICD-10-CM | POA: Diagnosis present

## 2021-12-07 DIAGNOSIS — Z882 Allergy status to sulfonamides status: Secondary | ICD-10-CM

## 2021-12-07 DIAGNOSIS — J811 Chronic pulmonary edema: Secondary | ICD-10-CM | POA: Diagnosis not present

## 2021-12-07 DIAGNOSIS — Z8249 Family history of ischemic heart disease and other diseases of the circulatory system: Secondary | ICD-10-CM

## 2021-12-07 DIAGNOSIS — Z20822 Contact with and (suspected) exposure to covid-19: Secondary | ICD-10-CM | POA: Diagnosis not present

## 2021-12-07 DIAGNOSIS — F17210 Nicotine dependence, cigarettes, uncomplicated: Secondary | ICD-10-CM | POA: Diagnosis present

## 2021-12-07 DIAGNOSIS — I4819 Other persistent atrial fibrillation: Secondary | ICD-10-CM | POA: Diagnosis present

## 2021-12-07 DIAGNOSIS — I10 Essential (primary) hypertension: Secondary | ICD-10-CM | POA: Diagnosis not present

## 2021-12-07 DIAGNOSIS — I5043 Acute on chronic combined systolic (congestive) and diastolic (congestive) heart failure: Secondary | ICD-10-CM | POA: Diagnosis not present

## 2021-12-07 DIAGNOSIS — F419 Anxiety disorder, unspecified: Secondary | ICD-10-CM | POA: Diagnosis not present

## 2021-12-07 DIAGNOSIS — I214 Non-ST elevation (NSTEMI) myocardial infarction: Secondary | ICD-10-CM | POA: Diagnosis not present

## 2021-12-07 DIAGNOSIS — Z9981 Dependence on supplemental oxygen: Secondary | ICD-10-CM

## 2021-12-07 DIAGNOSIS — R079 Chest pain, unspecified: Principal | ICD-10-CM

## 2021-12-07 DIAGNOSIS — Z951 Presence of aortocoronary bypass graft: Secondary | ICD-10-CM

## 2021-12-07 DIAGNOSIS — I152 Hypertension secondary to endocrine disorders: Secondary | ICD-10-CM | POA: Diagnosis present

## 2021-12-07 DIAGNOSIS — M069 Rheumatoid arthritis, unspecified: Secondary | ICD-10-CM | POA: Diagnosis present

## 2021-12-07 DIAGNOSIS — I213 ST elevation (STEMI) myocardial infarction of unspecified site: Secondary | ICD-10-CM | POA: Diagnosis present

## 2021-12-07 DIAGNOSIS — L89613 Pressure ulcer of right heel, stage 3: Secondary | ICD-10-CM | POA: Diagnosis present

## 2021-12-07 DIAGNOSIS — M1711 Unilateral primary osteoarthritis, right knee: Secondary | ICD-10-CM

## 2021-12-07 DIAGNOSIS — J4531 Mild persistent asthma with (acute) exacerbation: Secondary | ICD-10-CM

## 2021-12-07 DIAGNOSIS — F32A Anxiety disorder, unspecified: Secondary | ICD-10-CM | POA: Diagnosis present

## 2021-12-07 DIAGNOSIS — G4733 Obstructive sleep apnea (adult) (pediatric): Secondary | ICD-10-CM | POA: Diagnosis not present

## 2021-12-07 DIAGNOSIS — Z955 Presence of coronary angioplasty implant and graft: Secondary | ICD-10-CM

## 2021-12-07 DIAGNOSIS — Z66 Do not resuscitate: Secondary | ICD-10-CM | POA: Diagnosis present

## 2021-12-07 DIAGNOSIS — J029 Acute pharyngitis, unspecified: Secondary | ICD-10-CM | POA: Diagnosis present

## 2021-12-07 DIAGNOSIS — G8929 Other chronic pain: Secondary | ICD-10-CM | POA: Diagnosis present

## 2021-12-07 DIAGNOSIS — M797 Fibromyalgia: Secondary | ICD-10-CM | POA: Diagnosis present

## 2021-12-07 DIAGNOSIS — I132 Hypertensive heart and chronic kidney disease with heart failure and with stage 5 chronic kidney disease, or end stage renal disease: Secondary | ICD-10-CM | POA: Diagnosis present

## 2021-12-07 DIAGNOSIS — E1151 Type 2 diabetes mellitus with diabetic peripheral angiopathy without gangrene: Secondary | ICD-10-CM | POA: Diagnosis present

## 2021-12-07 DIAGNOSIS — T380X5A Adverse effect of glucocorticoids and synthetic analogues, initial encounter: Secondary | ICD-10-CM | POA: Diagnosis present

## 2021-12-07 DIAGNOSIS — I480 Paroxysmal atrial fibrillation: Secondary | ICD-10-CM

## 2021-12-07 DIAGNOSIS — J45901 Unspecified asthma with (acute) exacerbation: Secondary | ICD-10-CM | POA: Diagnosis present

## 2021-12-07 DIAGNOSIS — Z940 Kidney transplant status: Secondary | ICD-10-CM

## 2021-12-07 DIAGNOSIS — I25119 Atherosclerotic heart disease of native coronary artery with unspecified angina pectoris: Secondary | ICD-10-CM | POA: Diagnosis present

## 2021-12-07 DIAGNOSIS — J9611 Chronic respiratory failure with hypoxia: Secondary | ICD-10-CM | POA: Diagnosis present

## 2021-12-07 DIAGNOSIS — J441 Chronic obstructive pulmonary disease with (acute) exacerbation: Secondary | ICD-10-CM | POA: Diagnosis not present

## 2021-12-07 DIAGNOSIS — I739 Peripheral vascular disease, unspecified: Secondary | ICD-10-CM

## 2021-12-07 DIAGNOSIS — Z87442 Personal history of urinary calculi: Secondary | ICD-10-CM

## 2021-12-07 DIAGNOSIS — Z888 Allergy status to other drugs, medicaments and biological substances status: Secondary | ICD-10-CM

## 2021-12-07 DIAGNOSIS — E669 Obesity, unspecified: Secondary | ICD-10-CM | POA: Diagnosis present

## 2021-12-07 DIAGNOSIS — K219 Gastro-esophageal reflux disease without esophagitis: Secondary | ICD-10-CM | POA: Diagnosis not present

## 2021-12-07 DIAGNOSIS — I499 Cardiac arrhythmia, unspecified: Secondary | ICD-10-CM | POA: Diagnosis not present

## 2021-12-07 DIAGNOSIS — N186 End stage renal disease: Secondary | ICD-10-CM | POA: Diagnosis present

## 2021-12-07 DIAGNOSIS — Z79899 Other long term (current) drug therapy: Secondary | ICD-10-CM

## 2021-12-07 DIAGNOSIS — I447 Left bundle-branch block, unspecified: Secondary | ICD-10-CM | POA: Diagnosis not present

## 2021-12-07 DIAGNOSIS — J45909 Unspecified asthma, uncomplicated: Secondary | ICD-10-CM | POA: Diagnosis present

## 2021-12-07 DIAGNOSIS — I5042 Chronic combined systolic (congestive) and diastolic (congestive) heart failure: Secondary | ICD-10-CM | POA: Diagnosis present

## 2021-12-07 DIAGNOSIS — Z6838 Body mass index (BMI) 38.0-38.9, adult: Secondary | ICD-10-CM

## 2021-12-07 DIAGNOSIS — I4891 Unspecified atrial fibrillation: Secondary | ICD-10-CM | POA: Diagnosis not present

## 2021-12-07 DIAGNOSIS — Z800 Family history of malignant neoplasm of digestive organs: Secondary | ICD-10-CM

## 2021-12-07 DIAGNOSIS — Z7951 Long term (current) use of inhaled steroids: Secondary | ICD-10-CM

## 2021-12-07 DIAGNOSIS — Z9049 Acquired absence of other specified parts of digestive tract: Secondary | ICD-10-CM

## 2021-12-07 DIAGNOSIS — J449 Chronic obstructive pulmonary disease, unspecified: Secondary | ICD-10-CM | POA: Diagnosis present

## 2021-12-07 DIAGNOSIS — R54 Age-related physical debility: Secondary | ICD-10-CM | POA: Diagnosis present

## 2021-12-07 DIAGNOSIS — B370 Candidal stomatitis: Secondary | ICD-10-CM | POA: Diagnosis not present

## 2021-12-07 DIAGNOSIS — Z881 Allergy status to other antibiotic agents status: Secondary | ICD-10-CM

## 2021-12-07 DIAGNOSIS — H547 Unspecified visual loss: Secondary | ICD-10-CM | POA: Diagnosis present

## 2021-12-07 DIAGNOSIS — Z7901 Long term (current) use of anticoagulants: Secondary | ICD-10-CM

## 2021-12-07 DIAGNOSIS — Z7984 Long term (current) use of oral hypoglycemic drugs: Secondary | ICD-10-CM

## 2021-12-07 LAB — CBC WITH DIFFERENTIAL/PLATELET
Abs Immature Granulocytes: 0.05 10*3/uL (ref 0.00–0.07)
Basophils Absolute: 0.1 10*3/uL (ref 0.0–0.1)
Basophils Relative: 1 %
Eosinophils Absolute: 0 10*3/uL (ref 0.0–0.5)
Eosinophils Relative: 0 %
HCT: 36.3 % (ref 36.0–46.0)
Hemoglobin: 11.1 g/dL — ABNORMAL LOW (ref 12.0–15.0)
Immature Granulocytes: 1 %
Lymphocytes Relative: 10 %
Lymphs Abs: 0.9 10*3/uL (ref 0.7–4.0)
MCH: 27.3 pg (ref 26.0–34.0)
MCHC: 30.6 g/dL (ref 30.0–36.0)
MCV: 89.4 fL (ref 80.0–100.0)
Monocytes Absolute: 0.3 10*3/uL (ref 0.1–1.0)
Monocytes Relative: 3 %
Neutro Abs: 7.8 10*3/uL — ABNORMAL HIGH (ref 1.7–7.7)
Neutrophils Relative %: 85 %
Platelets: 231 10*3/uL (ref 150–400)
RBC: 4.06 MIL/uL (ref 3.87–5.11)
RDW: 19.8 % — ABNORMAL HIGH (ref 11.5–15.5)
WBC: 9.1 10*3/uL (ref 4.0–10.5)
nRBC: 0 % (ref 0.0–0.2)

## 2021-12-07 LAB — BASIC METABOLIC PANEL
Anion gap: 10 (ref 5–15)
BUN: 10 mg/dL (ref 8–23)
CO2: 22 mmol/L (ref 22–32)
Calcium: 9.1 mg/dL (ref 8.9–10.3)
Chloride: 100 mmol/L (ref 98–111)
Creatinine, Ser: 0.95 mg/dL (ref 0.44–1.00)
GFR, Estimated: >60 mL/min (ref >60)
Glucose, Bld: 331 mg/dL — ABNORMAL HIGH (ref 70–99)
Potassium: 4.9 mmol/L (ref 3.5–5.1)
Sodium: 132 mmol/L — ABNORMAL LOW (ref 135–145)

## 2021-12-07 LAB — BRAIN NATRIURETIC PEPTIDE: B Natriuretic Peptide: 1347.1 pg/mL — ABNORMAL HIGH (ref 0.0–100.0)

## 2021-12-07 LAB — TROPONIN I (HIGH SENSITIVITY): Troponin I (High Sensitivity): 15 ng/L (ref <18)

## 2021-12-07 MED ORDER — ARFORMOTEROL TARTRATE 15 MCG/2ML IN NEBU
15.0000 ug | INHALATION_SOLUTION | Freq: Two times a day (BID) | RESPIRATORY_TRACT | Status: DC
  Administered 2021-12-08 – 2021-12-12 (×10): 15 ug via RESPIRATORY_TRACT
  Filled 2021-12-07 (×12): qty 2

## 2021-12-07 MED ORDER — PREDNISONE 20 MG PO TABS: 40.0000 mg | ORAL_TABLET | Freq: Every day | ORAL | Status: DC

## 2021-12-07 MED ORDER — FENTANYL CITRATE PF 50 MCG/ML IJ SOSY
50.0000 ug | PREFILLED_SYRINGE | Freq: Once | INTRAMUSCULAR | Status: AC
  Administered 2021-12-07: 50 ug via INTRAVENOUS
  Filled 2021-12-07: qty 1

## 2021-12-07 MED ORDER — ISOSORBIDE MONONITRATE ER 60 MG PO TB24
60.0000 mg | ORAL_TABLET | Freq: Every day | ORAL | Status: DC
Start: 2021-12-08 — End: 2021-12-13
  Administered 2021-12-08 – 2021-12-12 (×5): 60 mg via ORAL
  Filled 2021-12-07 (×2): qty 1
  Filled 2021-12-07: qty 2
  Filled 2021-12-07 (×2): qty 1

## 2021-12-07 MED ORDER — ROSUVASTATIN CALCIUM 20 MG PO TABS
20.0000 mg | ORAL_TABLET | Freq: Every day | ORAL | Status: DC
  Administered 2021-12-08 – 2021-12-12 (×5): 20 mg via ORAL
  Filled 2021-12-07 (×5): qty 1

## 2021-12-07 MED ORDER — AZATHIOPRINE 50 MG PO TABS
125.0000 mg | ORAL_TABLET | Freq: Every day | ORAL | Status: DC
Start: 2021-12-08 — End: 2021-12-13
  Administered 2021-12-08 – 2021-12-12 (×5): 125 mg via ORAL
  Filled 2021-12-07 (×9): qty 3

## 2021-12-07 MED ORDER — IPRATROPIUM-ALBUTEROL 0.5-2.5 (3) MG/3ML IN SOLN
3.0000 mL | Freq: Four times a day (QID) | RESPIRATORY_TRACT | Status: DC
  Administered 2021-12-08 – 2021-12-09 (×4): 3 mL via RESPIRATORY_TRACT
  Filled 2021-12-07 (×5): qty 3

## 2021-12-07 MED ORDER — CLOPIDOGREL BISULFATE 75 MG PO TABS
75.0000 mg | ORAL_TABLET | Freq: Every day | ORAL | Status: DC
Start: 2021-12-08 — End: 2021-12-13
  Administered 2021-12-08 – 2021-12-12 (×5): 75 mg via ORAL
  Filled 2021-12-07 (×5): qty 1

## 2021-12-07 MED ORDER — GABAPENTIN 100 MG PO CAPS
100.0000 mg | ORAL_CAPSULE | Freq: Every day | ORAL | Status: DC
  Administered 2021-12-08 – 2021-12-12 (×6): 100 mg via ORAL
  Filled 2021-12-07 (×6): qty 1

## 2021-12-07 MED ORDER — FENTANYL CITRATE PF 50 MCG/ML IJ SOSY
50.0000 ug | PREFILLED_SYRINGE | Freq: Once | INTRAMUSCULAR | Status: AC
  Administered 2021-12-08: 50 ug via INTRAVENOUS
  Filled 2021-12-07: qty 1

## 2021-12-07 MED ORDER — CALCITRIOL 0.25 MCG PO CAPS
0.2500 ug | ORAL_CAPSULE | ORAL | Status: DC
  Administered 2021-12-09 – 2021-12-12 (×2): 0.25 ug via ORAL
  Filled 2021-12-07 (×3): qty 1

## 2021-12-07 MED ORDER — LAMOTRIGINE 25 MG PO TABS
25.0000 mg | ORAL_TABLET | Freq: Every day | ORAL | Status: DC
  Administered 2021-12-08 – 2021-12-12 (×6): 25 mg via ORAL
  Filled 2021-12-07 (×6): qty 1

## 2021-12-07 MED ORDER — SODIUM CHLORIDE 0.9% FLUSH
3.0000 mL | Freq: Two times a day (BID) | INTRAVENOUS | Status: DC
  Administered 2021-12-08 – 2021-12-12 (×9): 3 mL via INTRAVENOUS

## 2021-12-07 MED ORDER — FUROSEMIDE 10 MG/ML IJ SOLN
40.0000 mg | Freq: Two times a day (BID) | INTRAMUSCULAR | Status: DC
  Administered 2021-12-08 – 2021-12-10 (×5): 40 mg via INTRAVENOUS
  Filled 2021-12-07 (×5): qty 4

## 2021-12-07 MED ORDER — RANOLAZINE ER 500 MG PO TB12
500.0000 mg | ORAL_TABLET | Freq: Two times a day (BID) | ORAL | Status: DC
  Administered 2021-12-08 (×2): 500 mg via ORAL
  Filled 2021-12-07 (×3): qty 1

## 2021-12-07 MED ORDER — PANTOPRAZOLE SODIUM 40 MG PO TBEC
40.0000 mg | DELAYED_RELEASE_TABLET | Freq: Every day | ORAL | Status: DC
Start: 2021-12-08 — End: 2021-12-13
  Administered 2021-12-08 – 2021-12-12 (×5): 40 mg via ORAL
  Filled 2021-12-07 (×5): qty 1

## 2021-12-07 MED ORDER — FENTANYL CITRATE PF 50 MCG/ML IJ SOSY
25.0000 ug | PREFILLED_SYRINGE | INTRAMUSCULAR | Status: AC | PRN
  Administered 2021-12-08: 25 ug via INTRAVENOUS
  Filled 2021-12-07: qty 1

## 2021-12-07 MED ORDER — BUDESONIDE 0.5 MG/2ML IN SUSP
2.0000 mL | Freq: Two times a day (BID) | RESPIRATORY_TRACT | Status: DC
  Administered 2021-12-08 – 2021-12-10 (×6): 0.5 mg via RESPIRATORY_TRACT
  Filled 2021-12-07 (×7): qty 2

## 2021-12-07 MED ORDER — APIXABAN 5 MG PO TABS
5.0000 mg | ORAL_TABLET | Freq: Two times a day (BID) | ORAL | Status: DC
  Administered 2021-12-08: 5 mg via ORAL
  Filled 2021-12-07: qty 1

## 2021-12-07 MED ORDER — SERTRALINE HCL 100 MG PO TABS
100.0000 mg | ORAL_TABLET | Freq: Every day | ORAL | Status: DC
  Administered 2021-12-08 – 2021-12-12 (×5): 100 mg via ORAL
  Filled 2021-12-07 (×5): qty 1

## 2021-12-07 MED ORDER — ALBUTEROL SULFATE (2.5 MG/3ML) 0.083% IN NEBU
2.5000 mg | INHALATION_SOLUTION | RESPIRATORY_TRACT | Status: DC | PRN
  Administered 2021-12-11: 2.5 mg via RESPIRATORY_TRACT
  Filled 2021-12-07: qty 3

## 2021-12-07 MED ORDER — METOPROLOL TARTRATE 25 MG PO TABS
25.0000 mg | ORAL_TABLET | Freq: Three times a day (TID) | ORAL | Status: DC
  Administered 2021-12-08 – 2021-12-12 (×14): 25 mg via ORAL
  Filled 2021-12-07 (×14): qty 1

## 2021-12-07 MED ORDER — MONTELUKAST SODIUM 10 MG PO TABS
10.0000 mg | ORAL_TABLET | Freq: Every day | ORAL | Status: DC
  Administered 2021-12-08 – 2021-12-12 (×6): 10 mg via ORAL
  Filled 2021-12-07 (×7): qty 1

## 2021-12-07 MED ORDER — PREDNISONE 20 MG PO TABS
50.0000 mg | ORAL_TABLET | Freq: Every day | ORAL | Status: DC
Start: 2021-12-08 — End: 2021-12-08

## 2021-12-07 MED ORDER — LORAZEPAM 1 MG PO TABS
1.0000 mg | ORAL_TABLET | Freq: Three times a day (TID) | ORAL | Status: DC | PRN
  Administered 2021-12-08 – 2021-12-12 (×9): 1 mg via SUBLINGUAL
  Filled 2021-12-07 (×9): qty 1

## 2021-12-07 MED ORDER — ACETAMINOPHEN 325 MG PO TABS
650.0000 mg | ORAL_TABLET | Freq: Four times a day (QID) | ORAL | Status: DC | PRN
  Administered 2021-12-08: 650 mg via ORAL
  Filled 2021-12-07: qty 2

## 2021-12-07 MED ORDER — PREDNISONE 5 MG PO TABS
5.0000 mg | ORAL_TABLET | Freq: Every day | ORAL | Status: DC
Start: 2021-12-08 — End: 2021-12-07

## 2021-12-07 MED ORDER — FUROSEMIDE 10 MG/ML IJ SOLN
60.0000 mg | Freq: Once | INTRAMUSCULAR | Status: AC
  Administered 2021-12-08: 60 mg via INTRAVENOUS
  Filled 2021-12-07: qty 6

## 2021-12-07 MED ORDER — ACETAMINOPHEN 650 MG RE SUPP: 650.0000 mg | Freq: Four times a day (QID) | RECTAL | Status: DC | PRN

## 2021-12-07 MED ORDER — NITROGLYCERIN 0.4 MG SL SUBL
0.4000 mg | SUBLINGUAL_TABLET | SUBLINGUAL | Status: DC | PRN
  Administered 2021-12-07: 0.4 mg via SUBLINGUAL
  Filled 2021-12-07: qty 1

## 2021-12-07 MED ORDER — OLANZAPINE 5 MG PO TABS
5.0000 mg | ORAL_TABLET | Freq: Every day | ORAL | Status: DC
  Administered 2021-12-08 – 2021-12-12 (×6): 5 mg via ORAL
  Filled 2021-12-07 (×6): qty 1

## 2021-12-07 MED ORDER — OXYCODONE HCL 5 MG PO TABS
10.0000 mg | ORAL_TABLET | Freq: Four times a day (QID) | ORAL | Status: DC | PRN
  Administered 2021-12-08 – 2021-12-12 (×12): 10 mg via ORAL
  Filled 2021-12-07 (×12): qty 2

## 2021-12-07 NOTE — ED Triage Notes (Signed)
Patient arrived via EMS from home with complaints of shortness of breath for 2 days. Patient states she used her home inhaler with no relief. Initial SpO2-86% on room air per EMS. EMS given '10mg'$  albuterol, '1mg'$  atrovent, 2g magnesium, '125mg'$  solumedrol. Per EMS BP-140/100, CBG-358.  ?

## 2021-12-07 NOTE — ED Provider Notes (Signed)
?[facility] ?Provider Note ? ? ?CSN: [id number] ?Arrival date & time: 12/07/21  2034 ? ?  ? ?History ? ?Chief Complaint  ?Patient presents with  ? Respiratory Distress  ? ? ?Katherine [name] is a 66 y.o. female.  Patient presents to the emergency department via EMS complaining of shortness of breath.  Patient states that for the past 2 days she has had increasing oxygen demand at home.  Patient is normally on 5 L/min nasal cannula at home.  Over the past days she has had a bump nasal cannula usage up to 10 L/min.  Patient states that today she continued to become increasingly short of breath.  EMS was called to the house and noted that her oxygen saturation was in the 70% range on 5 L/min.  EMS administered a gram albuterol, 1 mg Atrovent, 2 g magnesium, and 125 mg Solu-Medrol prior to arrival.  Patient endorses shortness of breath, wheezing, chest pain.  Denies abdominal pain, headache, neurological symptoms.  Past medical history significant for coronary artery disease, dyslipidemia, peripheral artery disease, end-stage renal disease, OSA, pneumonia, history of torsades de point, DM type II, HTN, PAF, COPD, history of stroke.  Patient admitted in April of this year, discharged on April 8 with acute on chronic hypoxemic respiratory failure, coronary artery disease with chronic angina, untreatable coronary obstructions on Plavix, Eliquis, metoprolol, and Ranexa, severe COPD and chronic hypoxemic respiratory failure on 5 L at home, paroxysmal A-fib, on Eliquis, history of renal transplant, on Imuran and prednisone ? ?HPI ? ?  ? ?Home Medications ?Prior to Admission medications   ?Medication Sig Start Date End Date Taking? Authorizing Provider  ?albuterol (VENTOLIN HFA) 108 (90 Base) MCG/ACT inhaler TAKE 2 PUFFS BY MOUTH EVERY 6 HOURS AS NEEDED FOR WHEEZE OR SHORTNESS OF BREATH ?Patient taking differently: 2 puffs every 6 (six) hours as needed for wheezing or shortness of  breath. 02/10/21   Byrum, Rose Fillers, MD  ?apixaban (ELIQUIS) 5 MG TABS tablet Take 1 tablet (5 mg total) by mouth 2 (two) times daily. 10/21/21   Pokhrel, Corrie Mckusick, MD  ?arformoterol (BROVANA) 15 MCG/2ML NEBU Take 2 mLs (15 mcg total) by nebulization 2 (two) times daily. 08/30/19   Collene Gobble, MD  ?azaTHIOprine (IMURAN) 50 MG tablet Take 2 & 1/2 tablets (125 mg) by mouth daily at 3pm ?Patient taking differently: Take 125 mg by mouth daily. 10/21/21   Pokhrel, Corrie Mckusick, MD  ?bisacodyl (DULCOLAX) 5 MG EC tablet Take 1 tablet (5 mg total) by mouth daily as needed for moderate constipation. 10/05/21   Allie Bossier, MD  ?budesonide (PULMICORT) 0.5 MG/2ML nebulizer solution Take 2 mLs (0.5 mg total) by nebulization 2 (two) times daily. ?Patient taking differently: Take 2 mLs by nebulization 2 (two) times daily as needed (wheezing). 08/24/19   [name], MD  ?calcitRIOL (ROCALTROL) 0.25 MCG capsule Take 0.25 mcg by mouth See admin instructions. Takes every 3 days.    [provider]  ?clopidogrel (PLAVIX) 75 MG tablet Take 75 mg by mouth daily.    [provider]  ?cyclobenzaprine (FLEXERIL) 10 MG tablet Take 1 tablet (10 mg total) by mouth 3 (three) times daily as needed for muscle spasms. 08/25/21   Kathie Dike, MD  ?fluticasone (FLONASE) 50 MCG/ACT nasal spray PLACE 2 SPRAYS INTO BOTH NOSTRILS 2 TIMES DAILY. ?Patient taking differently: 2 sprays in the morning and at bedtime. 06/27/21   Collene Gobble, MD  ?furosemide (LASIX) 40 MG tablet  Take 40 mg by mouth daily.    [provider]  ?gabapentin (NEURONTIN) 100 MG capsule Take 1 capsule (100 mg total) by mouth at bedtime. 09/11/21   Hosie Poisson, MD  ?glimepiride (AMARYL) 4 MG tablet Take 4 mg by mouth daily with breakfast.    [provider]  ?insulin aspart (NOVOLOG FLEXPEN) 100 UNIT/ML FlexPen Inject 4-12 Units into the skin 3 (three) times daily with meals. Sliding scale.    [provider]  ?Insulin Glargine  (BASAGLAR KWIKPEN) 100 UNIT/ML Inject 20 Units into the skin daily.    [provider]  ?isosorbide mononitrate (IMDUR) 60 MG 24 hr tablet Take 1 tablet (60 mg total) by mouth daily. 09/27/21 11/20/21  Arrien, Jimmy Picket, MD  ?lamoTRIgine (LAMICTAL) 25 MG tablet TAKE 1 TABLET BY MOUTH EVERYDAY AT BEDTIME ?Patient taking differently: Take 25 mg by mouth daily. 07/25/21   Meredith Staggers, MD  ?LORazepam (ATIVAN) 1 MG tablet Take 1 mg by mouth 2 (two) times daily as needed for anxiety.    [provider]  ?LORazepam (ATIVAN) 2 MG/ML concentrated solution Place 0.5 mLs (1 mg total) under the tongue every 8 (eight) hours as needed for anxiety, seizure, sedation or sleep (distress). 12/01/21   [name], MD  ?metoprolol tartrate (LOPRESSOR) 25 MG tablet Take 1 tablet (25 mg total) by mouth 3 (three) times daily. ?Patient not taking: Reported on 11/20/2021 09/11/21   Hosie Poisson, MD  ?montelukast (SINGULAIR) 10 MG tablet TAKE 1 TABLET BY MOUTH EVERYDAY AT BEDTIME ?Patient taking differently: Take 10 mg by mouth at bedtime. 09/23/21   Collene Gobble, MD  ?nitroGLYCERIN (NITROSTAT) 0.4 MG SL tablet Place 0.4 mg under the tongue every 5 (five) minutes as needed for chest pain.    [provider]  ?OLANZapine (ZYPREXA) 5 MG tablet Take 5 mg by mouth at bedtime. 10/02/21   [provider]  ?Oxycodone HCl 10 MG TABS Take 1 tablet (10 mg total) by mouth 4 (four) times daily as needed for up to 7 days (pain). 12/01/21 12/08/21  [name], MD  ?OXYGEN Inhale 4 L into the lungs continuous.    [provider]  ?pantoprazole (PROTONIX) 40 MG tablet TAKE 1 TABLET BY MOUTH EVERY DAY ?Patient taking differently: Take 40 mg by mouth daily. 10/14/21   Meredith Staggers, MD  ?predniSONE (DELTASONE) 5 MG tablet Take 5 mg by mouth daily with breakfast.    [provider]  ?promethazine (PHENERGAN) 25 MG tablet Take 25 mg by mouth every 6 (six) hours as needed for nausea or  vomiting.    [provider]  ?ranolazine (RANEXA) 500 MG 12 hr tablet Take 1 tablet (500 mg total) by mouth 2 (two) times daily. 09/11/21   Hosie Poisson, MD  ?rosuvastatin (CRESTOR) 20 MG tablet Take 20 mg by mouth daily.    [provider]  ?sertraline (ZOLOFT) 100 MG tablet Take 1 tablet (100 mg total) by mouth daily. ?Patient taking differently: Take 100 mg by mouth at bedtime. 06/06/21   [name], MD  ?vitamin C (ASCORBIC ACID) 500 MG tablet Take 500 mg by mouth daily.    [provider]  ?   ? ?Allergies    ?Tetracycline, Niacin, Niaspan [niacin er], Sulfa antibiotics, Sulfonamide derivatives, Codeine, Erythromycin, Hydromorphone hcl, Morphine and related, Nalbuphine, Sulfasalazine, and Tape   ? ?Review of Systems   ?Review of Systems  ?Constitutional:  Negative for fever.  ?Respiratory:  Positive for  shortness of breath and wheezing. Negative for cough.   ?Cardiovascular:  Positive for chest pain.  ?Gastrointestinal:  Negative for abdominal pain, nausea and vomiting.  ?Skin:  Negative for pallor.  ? ?Physical Exam ?Updated Vital Signs ?BP (!) 133/98   Pulse (!) 111   Temp 98.1 ?F (36.7 ?C) (Oral)   Resp (!) 21   Ht '5\' 1"'$  (1.549 m)   Wt 81.6 kg   SpO2 91%   BMI 34.01 kg/m?  ?Physical Exam ?Vitals and nursing note reviewed.  ?Constitutional:   ?   General: She is in acute distress.  ?HENT:  ?   Head: Normocephalic and atraumatic.  ?Eyes:  ?   Conjunctiva/sclera: Conjunctivae normal.  ?Cardiovascular:  ?   Rate and Rhythm: Regular rhythm. Tachycardia present.  ?   Pulses: Normal pulses.  ?Pulmonary:  ?   Effort: Respiratory distress present.  ?   Breath sounds: Wheezing present.  ?Abdominal:  ?   Palpations: Abdomen is soft.  ?   Tenderness: There is no abdominal tenderness.  ?Musculoskeletal:  ?   Cervical back: Normal range of motion and neck supple.  ?Skin: ?   Comments: Ulceration to right heel as pictured below  ?Neurological:  ?   Mental Status: She is alert and  oriented to person, place, and time.  ? ? ? ?ED Results / Procedures / Treatments   ?Labs ?(all labs ordered are listed, but only abnormal results are displayed) ?Labs Reviewed  ?BASIC METABOLIC PANEL - Abnormal; Notable for the fo

## 2021-12-07 NOTE — H&P (Signed)
?History and Physical  ? ?Katherine [name] NTZ:[id number] DOB: [birth year] DOA: 12/07/2021 ? ?PCP: [name], MD  ? ?Patient coming from: Home ? ?Chief Complaint: Dyspnea ? ?HPI: Katherine [name] is a 66 y.o. female with medical history significant of chronic respiratory failure, COPD, asthma, CAD status post stenting, CABG and in grafts stenting, PAD, carotid artery disease, hyperlipidemia, OSA, obesity status post lap band, paroxysmal A-fib, polymorphic [name], CHF, GERD, ESRD status post renal transplant, hypertension, anemia, depression, anxiety, CVA presenting with worsening shortness of breath. ? ?Patient reports 2 days of progressive shortness of breath.  She is on 5 L chronically at home and has had to increase fluids over the past 2 days and has remained short of breath.  She was up to as much as 10 L earlier today.  Called EMS due to this continued shortness of breath despite increasing oxygen.  On EMS arrival patient was saturating 70% range on 5 L.  Patient received albuterol, Atrovent, magnesium, and Solu-Medrol in route. ? ?Patient reports chest pain wheezing.  Patient denies fevers, chills, abdominal pain, constipation, diarrhea, nausea, vomiting. ? ?Of note, has had multiple admissions this year already for NSTEMI's, pneumonia, CHF exacerbation. ? ?ED Course: Vital signs in the ED significant for heart rate in the 100s to 110s, respiratory rate in the 20s, initially requiring 8 L wean to 6 L supplemental oxygen.  Lab work-up included BMP significant for sodium of 132 which is near baseline and corrects to near normal considering glucose of 331.  CBC with hemoglobin stable at 11.1.  Troponin normal with repeat pending.  BNP elevated to over 1300.  Patient received fentanyl, Lasix in the ED. ? ?Review of Systems: As per HPI otherwise all other systems reviewed and are negative. ? ?Past Medical History:  ?Diagnosis Date  ? Anemia   ? Anxiety   ? Bilateral carotid artery stenosis   ? Carotid  duplex 01/7590: 6-38% LICA, 46-65% RICA, >99% RECA, f/u 1 yr suggested  ? CAD (coronary artery disease) of bypass graft 5/01; 3/'02, 8/'03, 10/'04; 1/15  ? PCI x 5 to SVG-D1   ? CAD in native artery 07/1993  ? 3 Vessel Disease (LAD-D1 & RCA) -- CABG (Dx in setting of inferior STEMI-PTCA of RCA)  ? COPD mixed type ([place])   ? Followed by Dr. Lamonte Sakai "pulmonologist said no COPD"  ? Depression with anxiety   ? Diabetes mellitus type 2 in obese [facility])   ? Diarrhea   ? started after cholecystectomy and mass removed from intestine  ? Dyslipidemia, goal LDL below 70   ? 08/2012: TC 137, TG 200, HDL 32!, LDL 45; on statin (followed by Dr.Deterding)  ? ESRD (end stage renal disease) ([place]) 1991  ? s/p Cadaveric Renal Transplant [facility] - Dr. Jimmy Footman)   ? Family history of adverse reaction to anesthesia   ? mom's bp dropped during/after anesthesia  ? Fibromyalgia   ? GERD (gastroesophageal reflux disease)   ? 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 07/1993  ? H/O: GI bleed   ? History of kidney stones   ? History of stroke 2012  ? "right eye stroke- half blind now"  ? 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 Witnessed syncopal event.  Had having having lots of nausea and vomiting with poor p.o. intake.  Thought to have QT prolongation with multiple medications involved and hypomagnesemia, hypokalemia.  Tikosyn discontinued along with Zoloft and Phenergan.  ? Hypertension  associated with diabetes ([place])   ? Mild aortic stenosis by prior echocardiogram 07/2019  ? Echo:  Mild aortic stenosis (gradients: Mean 14.3 mmHg -peak 24.9 mmHg).  ? Morbid obesity ([place])   ? MRSA (methicillin resistant staph aureus) culture positive   ? OSA (obstructive sleep apnea)   ? no longer on CPAP or home O2, states she doesn't need now after lap band  ? PAD (peripheral artery disease) ([place]) 08/2013  ? LEA Dopplers to be read by Dr. Fletcher Anon  ? PAF (paroxysmal atrial fibrillation) ([place]) 06/2014  ? Noted on CardioNet  Monitor  - --> rhythm control with Tikosyn (Dr. Rayann Heman); converted from warfarin to apixaban for anticoagulation.  ? Pneumonia   ? Recurrent boils   ? Bilateral Groin  ? Rheumatoid arthritis ([place])   ? Per Patient Report; associated with OA  ? S/p cadaver renal transplant 1991  ? [place]  ? ? ?Past Surgical History:  ?Procedure Laterality Date  ? ABDOMINAL AORTOGRAM N/A 04/21/2018  ? Procedure: ABDOMINAL AORTOGRAM;  Surgeon: [name], MD;  Location: [place] CV LAB;  Service: Cardiovascular;  Laterality: N/A;  ? CATHETER REMOVAL    ? CHOLECYSTECTOMY N/A 10/29/2014  ? Procedure: LAPAROSCOPIC CHOLECYSTECTOMY WITH INTRAOPERATIVE CHOLANGIOGRAM;  Surgeon: Excell Seltzer, MD;  Location: [facility];  Service: General;  Laterality: N/A;  ? [place]  ? x5  ? CORONARY ARTERY BYPASS GRAFT  1995  ? LIMA-LAD, SVG-RPDA, SVG-D1  ? CORONARY STENT INTERVENTION N/A 02/20/2021  ? Procedure: PERCUTANEOUS CORONARY STENT INTERVENTION;  Surgeon: [name], MD;  Location: [place] INVASIVE CV LAB; ostLCx 60% (Neg RFR 0.96);; SVG- D2 recurrent 90% ISR & 95% native D2 after graft-> DES PCI of 95% anastomotic D2 lesion (Onyx Frontier 2.25 mm x 12 mm => 2.75 mm @ overlap, 2.5 distal.);PTCA of ISR in body of graft. ->  2.5 mm scoring balloon & post-dil w/ 2.75 mm [place] balloon  ? ESOPHAGOGASTRODUODENOSCOPY N/A 10/15/2016  ? Procedure: ESOPHAGOGASTRODUODENOSCOPY (EGD);  Surgeon: Wilford Corner, MD;  Location: [facility] ENDOSCOPY;  Service: Endoscopy;  Laterality: N/A;  ? I & D EXTREMITY Right 01/29/2018  ? Procedure: IRRIGATION AND DEBRIDEMENT THUMB;  Surgeon: Dayna Barker, MD;  Location: [place];  Service: Plastics;  Laterality: Right;  ? INCISE AND DRAIN ABCESS    ? INTRAVASCULAR PRESSURE WIRE/FFR STUDY N/A 02/20/2021  ? Procedure: INTRAVASCULAR PRESSURE WIRE/FFR STUDY;  Surgeon: [name], MD;  Location: [place] CV LAB;  Service: Cardiovascular;  Laterality: N/A;  ? KIDNEY TRANSPLANT  1991  ? KNEE ARTHROSCOPY WITH  LATERAL MENISECTOMY Left 12/03/2017  ? Procedure: LEFT KNEE ARTHROSCOPY WITH LATERAL MENISECTOMY;  Surgeon: Earlie Server, MD;  Location: [place];  Service: Orthopedics;  Laterality: Left;  ? LAPAROSCOPIC GASTRIC BANDING  04/2004; 10/'09, 2/'10  ? Port Replacement x 2  ? LEFT HEART CATH AND CORONARY ANGIOGRAPHY N/A 02/20/2021  ? Procedure: LEFT HEART CATH AND CORONARY ANGIOGRAPHY;  Surgeon: [name], MD;  Location: [place] CV LAB;  Service: Cardiovascular;  Laterality: N/A;  ? LEFT HEART CATH AND CORS/GRAFTS ANGIOGRAPHY N/A 04/21/2018  ? Procedure: LEFT HEART CATH AND CORS/GRAFTS ANGIOGRAPHY;  Surgeon: [name], MD;  Location: [place] CV LAB;  Ost-Prox LAD 50% - proxLAD (pre & post D1) 100% CTO. Cx - patent, small OM1 (stable ~ ostial OM1 90%, too small for PCI) & 2 small LPL; Ost-distal RCA 100% CTO.  LIMA-LAD (not injected); SVG-dRCA patent, SVG-D1 - insertion stent ~20% ISR - Severe R CFA  disease w/ focal Sub TO  ? LEFT HEART CATH AND CORS/GRAFTS ANGIOGRAPHY  5/'01, 3/'02, 8/'03, 10/'04; 1/'15  ? 08/22/2013: LAD & RCA 100%; LIMA-LAD & SVG-rPDA patent; Cx-- OM1 60%, OM2 ostial ~50%; SVG-D1 - 80% mid, 50% distal ISR --PCI  ? LEFT HEART CATH AND CORS/GRAFTS ANGIOGRAPHY N/A 01/31/2021  ? Procedure: LEFT HEART CATH AND CORS/GRAFTS ANGIOGRAPHY;  Surgeon: Jolaine Artist, MD;  Location: [place] CV LAB;;  ? LEFT HEART CATH AND CORS/GRAFTS ANGIOGRAPHY N/A 09/05/2021  ? Procedure: LEFT HEART CATH AND CORS/GRAFTS ANGIOGRAPHY;  Surgeon: [name], MD;  Location: [place] CV LAB;  Service: Cardiovascular;  Laterality: N/A;  ? LEFT HEART CATH AND CORS/GRAFTS ANGIOGRAPHY N/A 08/21/2021  ? Procedure: LEFT HEART CATH AND CORS/GRAFTS ANGIOGRAPHY;  Surgeon: Belva Crome, MD;  Location: [place] CV LAB;  Service: Cardiovascular;  Laterality: N/A;  ? LEFT HEART CATHETERIZATION WITH CORONARY/GRAFT ANGIOGRAM N/A 08/23/2013  ? Procedure: LEFT HEART CATHETERIZATION WITH Beatrix Fetters;   Surgeon: Wellington Hampshire, MD;  Location: [facility] CATH LAB;  Service: Cardiovascular;  Laterality: N/A;  ? Lower Extremity Arterial Dopplers  08/2013  ? ABI: R 0.96, L 1.04  ? MULTIPLE TOOTH EXTRACTIONS  age 29

## 2021-12-08 ENCOUNTER — Observation Stay (HOSPITAL_COMMUNITY): Payer: Medicare Other

## 2021-12-08 DIAGNOSIS — J9611 Chronic respiratory failure with hypoxia: Secondary | ICD-10-CM | POA: Diagnosis not present

## 2021-12-08 DIAGNOSIS — E871 Hypo-osmolality and hyponatremia: Secondary | ICD-10-CM | POA: Diagnosis present

## 2021-12-08 DIAGNOSIS — I2510 Atherosclerotic heart disease of native coronary artery without angina pectoris: Secondary | ICD-10-CM | POA: Diagnosis not present

## 2021-12-08 DIAGNOSIS — J9621 Acute and chronic respiratory failure with hypoxia: Secondary | ICD-10-CM

## 2021-12-08 DIAGNOSIS — Z8616 Personal history of COVID-19: Secondary | ICD-10-CM | POA: Diagnosis not present

## 2021-12-08 DIAGNOSIS — R0602 Shortness of breath: Secondary | ICD-10-CM | POA: Diagnosis present

## 2021-12-08 DIAGNOSIS — M069 Rheumatoid arthritis, unspecified: Secondary | ICD-10-CM | POA: Diagnosis present

## 2021-12-08 DIAGNOSIS — I214 Non-ST elevation (NSTEMI) myocardial infarction: Secondary | ICD-10-CM

## 2021-12-08 DIAGNOSIS — G4733 Obstructive sleep apnea (adult) (pediatric): Secondary | ICD-10-CM | POA: Diagnosis not present

## 2021-12-08 DIAGNOSIS — J45901 Unspecified asthma with (acute) exacerbation: Secondary | ICD-10-CM | POA: Diagnosis present

## 2021-12-08 DIAGNOSIS — Z9861 Coronary angioplasty status: Secondary | ICD-10-CM

## 2021-12-08 DIAGNOSIS — Y92239 Unspecified place in hospital as the place of occurrence of the external cause: Secondary | ICD-10-CM | POA: Diagnosis present

## 2021-12-08 DIAGNOSIS — E1151 Type 2 diabetes mellitus with diabetic peripheral angiopathy without gangrene: Secondary | ICD-10-CM | POA: Diagnosis present

## 2021-12-08 DIAGNOSIS — I5032 Chronic diastolic (congestive) heart failure: Secondary | ICD-10-CM | POA: Diagnosis not present

## 2021-12-08 DIAGNOSIS — I6521 Occlusion and stenosis of right carotid artery: Secondary | ICD-10-CM | POA: Diagnosis present

## 2021-12-08 DIAGNOSIS — E785 Hyperlipidemia, unspecified: Secondary | ICD-10-CM | POA: Diagnosis not present

## 2021-12-08 DIAGNOSIS — I5043 Acute on chronic combined systolic (congestive) and diastolic (congestive) heart failure: Secondary | ICD-10-CM | POA: Diagnosis present

## 2021-12-08 DIAGNOSIS — I10 Essential (primary) hypertension: Secondary | ICD-10-CM | POA: Diagnosis not present

## 2021-12-08 DIAGNOSIS — J441 Chronic obstructive pulmonary disease with (acute) exacerbation: Secondary | ICD-10-CM

## 2021-12-08 DIAGNOSIS — N186 End stage renal disease: Secondary | ICD-10-CM | POA: Diagnosis not present

## 2021-12-08 DIAGNOSIS — I152 Hypertension secondary to endocrine disorders: Secondary | ICD-10-CM | POA: Diagnosis present

## 2021-12-08 DIAGNOSIS — Z20822 Contact with and (suspected) exposure to covid-19: Secondary | ICD-10-CM | POA: Diagnosis present

## 2021-12-08 DIAGNOSIS — L89613 Pressure ulcer of right heel, stage 3: Secondary | ICD-10-CM | POA: Diagnosis present

## 2021-12-08 DIAGNOSIS — I132 Hypertensive heart and chronic kidney disease with heart failure and with stage 5 chronic kidney disease, or end stage renal disease: Secondary | ICD-10-CM | POA: Diagnosis present

## 2021-12-08 DIAGNOSIS — E1165 Type 2 diabetes mellitus with hyperglycemia: Secondary | ICD-10-CM | POA: Diagnosis present

## 2021-12-08 DIAGNOSIS — F32A Depression, unspecified: Secondary | ICD-10-CM | POA: Diagnosis present

## 2021-12-08 DIAGNOSIS — I213 ST elevation (STEMI) myocardial infarction of unspecified site: Secondary | ICD-10-CM | POA: Diagnosis present

## 2021-12-08 DIAGNOSIS — Z940 Kidney transplant status: Secondary | ICD-10-CM | POA: Diagnosis not present

## 2021-12-08 DIAGNOSIS — R54 Age-related physical debility: Secondary | ICD-10-CM | POA: Diagnosis present

## 2021-12-08 DIAGNOSIS — B370 Candidal stomatitis: Secondary | ICD-10-CM | POA: Diagnosis not present

## 2021-12-08 DIAGNOSIS — I5023 Acute on chronic systolic (congestive) heart failure: Secondary | ICD-10-CM

## 2021-12-08 DIAGNOSIS — D649 Anemia, unspecified: Secondary | ICD-10-CM | POA: Diagnosis present

## 2021-12-08 DIAGNOSIS — F419 Anxiety disorder, unspecified: Secondary | ICD-10-CM | POA: Diagnosis not present

## 2021-12-08 DIAGNOSIS — Z66 Do not resuscitate: Secondary | ICD-10-CM | POA: Diagnosis present

## 2021-12-08 DIAGNOSIS — I4819 Other persistent atrial fibrillation: Secondary | ICD-10-CM | POA: Diagnosis present

## 2021-12-08 DIAGNOSIS — Z9981 Dependence on supplemental oxygen: Secondary | ICD-10-CM | POA: Diagnosis not present

## 2021-12-08 DIAGNOSIS — E669 Obesity, unspecified: Secondary | ICD-10-CM | POA: Diagnosis present

## 2021-12-08 LAB — RESPIRATORY PANEL BY PCR

## 2021-12-08 LAB — CBC
HCT: 36.7 % (ref 36.0–46.0)
Hemoglobin: 11.3 g/dL — ABNORMAL LOW (ref 12.0–15.0)
MCH: 27.1 pg (ref 26.0–34.0)
MCHC: 30.8 g/dL (ref 30.0–36.0)
MCV: 88 fL (ref 80.0–100.0)
Platelets: 280 10*3/uL (ref 150–400)
RBC: 4.17 MIL/uL (ref 3.87–5.11)
RDW: 19.7 % — ABNORMAL HIGH (ref 11.5–15.5)
WBC: 7.1 10*3/uL (ref 4.0–10.5)
nRBC: 0 % (ref 0.0–0.2)

## 2021-12-08 LAB — APTT: aPTT: 46 seconds — ABNORMAL HIGH (ref 24–36)

## 2021-12-08 LAB — I-STAT ARTERIAL BLOOD GAS, ED
Acid-Base Excess: 1 mmol/L (ref 0.0–2.0)
Bicarbonate: 25.9 mmol/L (ref 20.0–28.0)
Calcium, Ion: 1.26 mmol/L (ref 1.15–1.40)
HCT: 37 % (ref 36.0–46.0)
Hemoglobin: 12.6 g/dL (ref 12.0–15.0)
O2 Saturation: 92 %
Patient temperature: 98.1
Potassium: 4.7 mmol/L (ref 3.5–5.1)
Sodium: 133 mmol/L — ABNORMAL LOW (ref 135–145)
TCO2: 27 mmol/L (ref 22–32)
pCO2 arterial: 41.8 mmHg (ref 32–48)
pH, Arterial: 7.399 (ref 7.35–7.45)
pO2, Arterial: 62 mmHg — ABNORMAL LOW (ref 83–108)

## 2021-12-08 LAB — COMPREHENSIVE METABOLIC PANEL
ALT: 9 U/L (ref 0–44)
AST: 28 U/L (ref 15–41)
Albumin: 2.6 g/dL — ABNORMAL LOW (ref 3.5–5.0)
Alkaline Phosphatase: 119 U/L (ref 38–126)
Anion gap: 11 (ref 5–15)
BUN: 11 mg/dL (ref 8–23)
CO2: 23 mmol/L (ref 22–32)
Calcium: 9 mg/dL (ref 8.9–10.3)
Chloride: 98 mmol/L (ref 98–111)
Creatinine, Ser: 1 mg/dL (ref 0.44–1.00)
GFR, Estimated: >60 mL/min (ref >60)
Glucose, Bld: 407 mg/dL — ABNORMAL HIGH (ref 70–99)
Potassium: 4.8 mmol/L (ref 3.5–5.1)
Sodium: 132 mmol/L — ABNORMAL LOW (ref 135–145)
Total Bilirubin: 0.8 mg/dL (ref 0.3–1.2)
Total Protein: 7.2 g/dL (ref 6.5–8.1)

## 2021-12-08 LAB — CBG MONITORING, ED
Glucose-Capillary: 396 mg/dL — ABNORMAL HIGH (ref 70–99)
Glucose-Capillary: 350 mg/dL — ABNORMAL HIGH (ref 70–99)

## 2021-12-08 LAB — TROPONIN I (HIGH SENSITIVITY)
Troponin I (High Sensitivity): 36 ng/L — ABNORMAL HIGH (ref <18)
Troponin I (High Sensitivity): 969 ng/L (ref <18)
Troponin I (High Sensitivity): 3156 ng/L (ref <18)

## 2021-12-08 LAB — GLUCOSE, CAPILLARY
Glucose-Capillary: 327 mg/dL — ABNORMAL HIGH (ref 70–99)
Glucose-Capillary: 321 mg/dL — ABNORMAL HIGH (ref 70–99)

## 2021-12-08 LAB — HEPARIN LEVEL (UNFRACTIONATED): Heparin Unfractionated: >1.1 IU/mL — ABNORMAL HIGH (ref 0.30–0.70)

## 2021-12-08 LAB — MAGNESIUM: Magnesium: 2.2 mg/dL (ref 1.7–2.4)

## 2021-12-08 MED ORDER — PERFLUTREN LIPID MICROSPHERE
1.0000 mL | INTRAVENOUS | Status: AC | PRN
  Administered 2021-12-08: 3 mL via INTRAVENOUS
  Filled 2021-12-08: qty 10

## 2021-12-08 MED ORDER — RANOLAZINE ER 500 MG PO TB12
1000.0000 mg | ORAL_TABLET | Freq: Two times a day (BID) | ORAL | Status: DC
  Administered 2021-12-08 – 2021-12-12 (×9): 1000 mg via ORAL
  Filled 2021-12-08 (×9): qty 2

## 2021-12-08 MED ORDER — INSULIN ASPART 100 UNIT/ML IJ SOLN
0.0000 [IU] | Freq: Every day | INTRAMUSCULAR | Status: DC
  Administered 2021-12-08: 4 [IU] via SUBCUTANEOUS
  Administered 2021-12-09: 5 [IU] via SUBCUTANEOUS

## 2021-12-08 MED ORDER — METHYLPREDNISOLONE SODIUM SUCC 125 MG IJ SOLR
80.0000 mg | Freq: Every day | INTRAMUSCULAR | Status: DC
  Administered 2021-12-08 – 2021-12-09 (×2): 80 mg via INTRAVENOUS
  Filled 2021-12-08 (×2): qty 2

## 2021-12-08 MED ORDER — HEPARIN (PORCINE) 25000 UT/250ML-% IV SOLN
1200.0000 [IU]/h | INTRAVENOUS | Status: DC
  Administered 2021-12-08: 1000 [IU]/h via INTRAVENOUS
  Administered 2021-12-09 – 2021-12-10 (×2): 1200 [IU]/h via INTRAVENOUS
  Filled 2021-12-08 (×3): qty 250

## 2021-12-08 MED ORDER — INSULIN ASPART 100 UNIT/ML IJ SOLN
0.0000 [IU] | Freq: Three times a day (TID) | INTRAMUSCULAR | Status: DC
  Administered 2021-12-08: 9 [IU] via SUBCUTANEOUS
  Administered 2021-12-08: 7 [IU] via SUBCUTANEOUS
  Administered 2021-12-08 – 2021-12-09 (×2): 9 [IU] via SUBCUTANEOUS
  Administered 2021-12-09: 7 [IU] via SUBCUTANEOUS
  Administered 2021-12-09: 9 [IU] via SUBCUTANEOUS
  Administered 2021-12-10: 5 [IU] via SUBCUTANEOUS

## 2021-12-08 MED ORDER — NITROGLYCERIN 0.4 MG SL SUBL
0.4000 mg | SUBLINGUAL_TABLET | SUBLINGUAL | Status: DC | PRN
  Administered 2021-12-08 – 2021-12-09 (×2): 0.4 mg via SUBLINGUAL
  Filled 2021-12-08 (×2): qty 1

## 2021-12-08 MED ORDER — INSULIN GLARGINE-YFGN 100 UNIT/ML ~~LOC~~ SOLN
15.0000 [IU] | Freq: Every day | SUBCUTANEOUS | Status: DC
  Administered 2021-12-08 – 2021-12-09 (×2): 15 [IU] via SUBCUTANEOUS
  Filled 2021-12-08 (×3): qty 0.15

## 2021-12-08 NOTE — Progress Notes (Signed)
ANTICOAGULATION CONSULT NOTE ? ?Pharmacy Consult for Heparin (Apixaban on hold) ?Indication: Elevated troponin, afib ? ?Allergies  ?Allergen Reactions  ? Tetracycline Hives  ?  Patient tolerated Doxycycline Dec 2020  ? Niacin Other (See Comments)  ?  Mouth blisters  ? Niaspan [Niacin Er] Other (See Comments)  ?  Mouth blisters  ? Sulfa Antibiotics Nausea Only and Other (See Comments)  ?  "Tears up stomach"  ? Sulfonamide Derivatives Other (See Comments)  ?  Reaction: per patient "tears her stomach up"  ? Codeine Nausea And Vomiting  ? Erythromycin Nausea And Vomiting  ? Hydromorphone Hcl Nausea And Vomiting  ? Morphine And Related Nausea And Vomiting  ? Nalbuphine Nausea And Vomiting  ?  Nubain  ? Sulfasalazine Nausea Only and Other (See Comments)  ?  per patient "tears her stomach up", "Tears up stomach"  ? Tape Rash and Other (See Comments)  ?  No "plastic" tape," please----cloth tape only  ? ? ?Patient Measurements: ?Height: '5\' 1"'$  (154.9 cm) ?Weight: 85.9 kg (189 lb 4.8 oz) ?IBW/kg (Calculated) : 47.8 ?Heparin dosing weight: 67.7 kg ? ?Vital Signs: ?Temp: 98.2 ?F (36.8 ?C) (04/24 1954) ?Temp Source: Oral (04/24 1954) ?BP: 105/77 (04/24 2120) ?Pulse Rate: 100 (04/24 2149) ? ?Labs: ?Recent Labs  ?  12/07/21 ?2125 12/08/21 ?0019 12/08/21 ?0025 12/08/21 ?0241 12/08/21 ?0434 12/08/21 ?6270 12/08/21 ?2115  ?HGB 11.1*  --  12.6  --  11.3*  --   --   ?HCT 36.3  --  37.0  --  36.7  --   --   ?PLT 231  --   --   --  280  --   --   ?APTT  --   --   --   --   --   --  46*  ?HEPARINUNFRC  --   --   --   --   --   --  >1.10*  ?CREATININE 0.95  --   --  1.00  --   --   --   ?TROPONINIHS 15 36*  --  969*  --  3,156*  --   ? ? ? ?Estimated Creatinine Clearance: 56.5 mL/min (by C-G formula based on SCr of 1 mg/dL). ? ? ?Medical History: ?Past Medical History:  ?Diagnosis Date  ? Anemia   ? Anxiety   ? Bilateral carotid artery stenosis   ? Carotid duplex 10/5007: 3-81% LICA, 82-99% RICA, >37% RECA, f/u 1 yr suggested  ? CAD  (coronary artery disease) of bypass graft 5/01; 3/'02, 8/'03, 10/'04; 1/15  ? PCI x 5 to SVG-D1   ? CAD in native artery 07/1993  ? 3 Vessel Disease (LAD-D1 & RCA) -- CABG (Dx in setting of inferior STEMI-PTCA of RCA)  ? COPD mixed type ([place])   ? Followed by Dr. Lamonte Sakai "pulmonologist said no COPD"  ? Depression with anxiety   ? Diabetes mellitus type 2 in obese [facility])   ? Diarrhea   ? started after cholecystectomy and mass removed from intestine  ? Dyslipidemia, goal LDL below 70   ? 08/2012: TC 137, TG 200, HDL 32!, LDL 45; on statin (followed by Dr.Deterding)  ? ESRD (end stage renal disease) ([place]) 1991  ? s/p Cadaveric Renal Transplant [facility] - Dr. Jimmy Footman)   ? Family history of adverse reaction to anesthesia   ? mom's bp dropped during/after anesthesia  ? Fibromyalgia   ? GERD (gastroesophageal reflux disease)   ? 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  07/1993  ? H/O: GI bleed   ? History of kidney stones   ? History of stroke 2012  ? "right eye stroke- half blind now"  ? 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 Witnessed syncopal event.  Had having having lots of nausea and vomiting with poor p.o. intake.  Thought to have QT prolongation with multiple medications involved and hypomagnesemia, hypokalemia.  Tikosyn discontinued along with Zoloft and Phenergan.  ? Hypertension associated with diabetes ([place])   ? Mild aortic stenosis by prior echocardiogram 07/2019  ? Echo:  Mild aortic stenosis (gradients: Mean 14.3 mmHg -peak 24.9 mmHg).  ? Morbid obesity ([place])   ? MRSA (methicillin resistant staph aureus) culture positive   ? OSA (obstructive sleep apnea)   ? no longer on CPAP or home O2, states she doesn't need now after lap band  ? PAD (peripheral artery disease) ([place]) 08/2013  ? LEA Dopplers to be read by Dr. Fletcher Anon  ? PAF (paroxysmal atrial fibrillation) ([place]) 06/2014  ? Noted on CardioNet Monitor  - --> rhythm control with Tikosyn (Dr. Rayann Heman); converted from  warfarin to apixaban for anticoagulation.  ? Pneumonia   ? Recurrent boils   ? Bilateral Groin  ? Rheumatoid arthritis ([place])   ? Per Patient Report; associated with OA  ? S/p cadaver renal transplant 1991  ? [place]  ? ? ? ?Assessment: ?66 y/o F with elevated troponin, on apixaban PTA for afib, holding apixaban and starting heparin, last dose of apixaban 4/24 at [place].  ? ?PM update - aPTT low at 46. Heparin level remains elevated, as expected due to influence of apixaban. CBC stable. No bleeding or issues with infusion per discussion with RN. ? ?Goal of Therapy:  ?Heparin level 0.3-0.7 units/ml ?aPTT 66-102 seconds ?Monitor platelets by anticoagulation protocol: Yes ?  ?Plan:  ?No bolus with recent apixaban. Increase heparin to 1200 units/hr ?Check 6hr aPTT ?Monitor daily CBC, s/sx bleeding ? ? ?Arturo Morton, PharmD, BCPS ?Please check AMION for all [place] contact numbers ?Clinical Pharmacist ?12/08/2021 10:32 PM ? ?

## 2021-12-08 NOTE — ED Notes (Signed)
Hospitalist aware of Trop.  ?

## 2021-12-08 NOTE — Progress Notes (Signed)
ANTICOAGULATION CONSULT NOTE - Initial Consult ? ?Pharmacy Consult for Heparin (Apixaban on hold) ?Indication: Elevated troponin, afib ? ?Allergies  ?Allergen Reactions  ? Tetracycline Hives  ?  Patient tolerated Doxycycline Dec 2020  ? Niacin Other (See Comments)  ?  Mouth blisters  ? Niaspan [Niacin Er] Other (See Comments)  ?  Mouth blisters  ? Sulfa Antibiotics Nausea Only and Other (See Comments)  ?  "Tears up stomach"  ? Sulfonamide Derivatives Other (See Comments)  ?  Reaction: per patient "tears her stomach up"  ? Codeine Nausea And Vomiting  ? Erythromycin Nausea And Vomiting  ? Hydromorphone Hcl Nausea And Vomiting  ? Morphine And Related Nausea And Vomiting  ? Nalbuphine Nausea And Vomiting  ?  Nubain  ? Sulfasalazine Nausea Only and Other (See Comments)  ?  per patient "tears her stomach up", "Tears up stomach"  ? Tape Rash and Other (See Comments)  ?  No "plastic" tape," please----cloth tape only  ? ? ?Patient Measurements: ?Height: '5\' 1"'$  (154.9 cm) ?Weight: 81.6 kg (180 lb) ?IBW/kg (Calculated) : 47.8 ? ?Vital Signs: ?Temp: 98.1 ?F (36.7 ?C) (04/23 2101) ?Temp Source: Oral (04/23 2101) ?BP: 107/94 (04/24 0330) ?Pulse Rate: 92 (04/24 0330) ? ?Labs: ?Recent Labs  ?  12/07/21 ?2125 12/08/21 ?0019 12/08/21 ?0025 12/08/21 ?0241  ?HGB 11.1*  --  12.6  --   ?HCT 36.3  --  37.0  --   ?PLT 231  --   --   --   ?CREATININE 0.95  --   --  1.00  ?TROPONINIHS 15 36*  --  969*  ? ? ?Estimated Creatinine Clearance: 55 mL/min (by C-G formula based on SCr of 1 mg/dL). ? ? ?Medical History: ?Past Medical History:  ?Diagnosis Date  ? Anemia   ? Anxiety   ? Bilateral carotid artery stenosis   ? Carotid duplex 11/5807: 9-83% LICA, 38-25% RICA, >05% RECA, f/u 1 yr suggested  ? CAD (coronary artery disease) of bypass graft 5/01; 3/'02, 8/'03, 10/'04; 1/15  ? PCI x 5 to SVG-D1   ? CAD in native artery 07/1993  ? 3 Vessel Disease (LAD-D1 & RCA) -- CABG (Dx in setting of inferior STEMI-PTCA of RCA)  ? COPD mixed type ([place])   ?  Followed by Dr. Lamonte Sakai "pulmonologist said no COPD"  ? Depression with anxiety   ? Diabetes mellitus type 2 in obese [facility])   ? Diarrhea   ? started after cholecystectomy and mass removed from intestine  ? Dyslipidemia, goal LDL below 70   ? 08/2012: TC 137, TG 200, HDL 32!, LDL 45; on statin (followed by Dr.Deterding)  ? ESRD (end stage renal disease) ([place]) 1991  ? s/p Cadaveric Renal Transplant [facility] - Dr. Jimmy Footman)   ? Family history of adverse reaction to anesthesia   ? mom's bp dropped during/after anesthesia  ? Fibromyalgia   ? GERD (gastroesophageal reflux disease)   ? 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 07/1993  ? H/O: GI bleed   ? History of kidney stones   ? History of stroke 2012  ? "right eye stroke- half blind now"  ? 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 Witnessed syncopal event.  Had having having lots of nausea and vomiting with poor p.o. intake.  Thought to have QT prolongation with multiple medications involved and hypomagnesemia, hypokalemia.  Tikosyn discontinued along with Zoloft and Phenergan.  ? Hypertension associated with diabetes ([place])   ? Mild aortic  stenosis by prior echocardiogram 07/2019  ? Echo:  Mild aortic stenosis (gradients: Mean 14.3 mmHg -peak 24.9 mmHg).  ? Morbid obesity ([place])   ? MRSA (methicillin resistant staph aureus) culture positive   ? OSA (obstructive sleep apnea)   ? no longer on CPAP or home O2, states she doesn't need now after lap band  ? PAD (peripheral artery disease) ([place]) 08/2013  ? LEA Dopplers to be read by Dr. Fletcher Anon  ? PAF (paroxysmal atrial fibrillation) ([place]) 06/2014  ? Noted on CardioNet Monitor  - --> rhythm control with Tikosyn (Dr. Rayann Heman); converted from warfarin to apixaban for anticoagulation.  ? Pneumonia   ? Recurrent boils   ? Bilateral Groin  ? Rheumatoid arthritis ([place])   ? Per Patient Report; associated with OA  ? S/p cadaver renal transplant 1991  ? [place]  ? ? ? ?Assessment: ?66 y/o F with  elevated troponin, on apixaban PTA for afib, holding apixaban and starting heparin, last dose of apixaban was this AM (4/24) at 0022. Will start heparin 12 hours from this dose. Anticipate using aPTT to dose for now. CBC/renal function good.  ? ?Goal of Therapy:  ?Heparin level 0.3-0.7 units/ml ?aPTT 66-102 seconds ?Monitor platelets by anticoagulation protocol: Yes ?  ?Plan:  ?Start heparin drip at 1000 units/hr today at 1230 ?2030 heparin level ?Daily CBC and heparin level ?Monitor for bleeding ? ?Narda Bonds, PharmD, BCPS ?Clinical Pharmacist ?Phone: [phone] ? ? ?

## 2021-12-08 NOTE — Consult Note (Addendum)
?Cardiology Consultation:  ? ?Patient ID: Katherine [name] ?MRN: [id number]; DOB: [birth year] ? ?Admit date: 12/07/2021 ?Date of Consult: 12/08/2021 ? ?PCP:  Katherine Lima, MD ?  ?[facility] Providers ?Cardiologist:  Katherine Hew, MD   { ? ? ?Patient Profile:  ? ?Katherine [name] is a 66 y.o. female with a PMH of CAD s/p CABG and multiple PCI's, oxygen dependent COPD, chronic diastolic heart failure, PAD, carotid artery disease, hypertension, hyperlipidemia, paroxysmal A-fib on Eliquis,type 2 DM, GERD, hx of ESRD s/p renal transplant 1991 on chronic immunosuppression meds, anxiety with depression, CVA, chronic anemia, OSA, torsades/QT prolongation (in setting of electrolyte disturbance, Tikosyn, Zoloft, phenergan), obesity s/p prior lap band,  who is being seen 12/08/2021 for the evaluation of elevated troponin at the request of Katherine [name]. ? ?History of Present Illness:  ? ?Katherine [name] with above complex PMH presented to ER yesterday with c/o SOB.  ? ?Patient has severe complex CAD with hx of CABG in 1995 with a [name] to LAD, SVG to diagonal and SVG to RCA. She had [name] AVF, chronic occlusion of R common femoral artery, and occlusion of left CFA.  Previous cath on 01/31/2021 by Katherine [name] via right radial approach showed CTO of the native RCA and LAD, 60% ostial lesion of Lcx, [name] to LAD not visualized, SVG to RCA 40%, SVG to D2 with recurrent high-grade 90% ISR with 95% lesion in D2 after graft insertion. Case was reviewed within interventional cardiology team, decision was made to intensify medical therapy and if it fails may consider flow wire interrogation of ostial LCx and repeat PCI of SVG-D2.  ? ?She was admitted Jan 2023 for COVID-19 pneumonia, found to have NSTEMI with trop >4000. Echo 08/09/21 showed EF 45-50%, trivial MR, apex is dyskinetic and apical septal segment is hypokinetic. Cardiac cath was attempted by Katherine Tamala Julian on 08/21/21 and it was unsuccessful as patient has bilateral iliofemoral  obstruction. Obstruction of the LFA and total occlusion of the RFA were confirmed by angiography. She has left arm dialysis access preventing access. The  graft to the diagonal contains overlapping DES x2 layers with distal anastomosis ISR (per previous cath). This was felt to be the possible culprit lesion for angina. No dilation was done as there was no long term meaningful solution. It was recommended to avoid future heart cath unless patient has clear cut ACS/STEMI with right radial approach (only access). She was recommended continue medical therapy for her CAD.  ? ?She had recurrent angina and another attempt for cardiac cath was done on 09/05/21 by Katherine [name] via right radial access, there was extensive radial artery spasm and unable to pass catheter beyond elbow. She also suffered the radial hematoma post procedure. She was treated with 48 hours heparin.  Palliative care was involved during that admission for goal of care discussion and patient was eventually discharged home with hospice.  ? ?Patient returned to hospital on 10/03/21 for severe chest pain and SOB and again found to have elevated troponin. Cardiology was consulted again and she was treated with heparin gtt, continued on Plavix, Imdur, Ranexa. No invasive interventions could be attempted due to above reason. She was also continue on Lasxi '40mg'$  BID for CHF.  ? ?Patient presented to ER 12/07/21 for worsening SOB for the past 2 days. She was found requiring more oxygen support at 10L  but on 5L at baseline. She states she has been wheezing a lot and felt air hunger often. She c/o chest pain, states  she always has chest pain, despite taking her meds. Pain is not triggered by exertion nor relieved by resting. She felt her pain is better now 4/10 after breathing treatment and oxycodone. She felt dizzy and overall poor. She had stopped taking her lasix over the past few days because she can't find it. She reports her right leg is swollen and has a ulcer.  She states she has been bed bound since she had Covid 08/2021. She is subsequently admitted to hospital medicine service for acute on chronic hypoxic respiratory failure, COPD exacerbation, CHF exacerbation.   ? ?Admission diagnostics showed hyponatremia 132, hyperglycemia 331.  BNP 1347.  High sensitive troponin 15 >36 >969 >3156.  CBC with hemoglobin 11.3. BNP 1347. CXR showed unchanged cardiomegaly and mild interstitial pulmonary edema. Admission EKG showed A fib with VR 105 bpm, old LBBB, no acute changes. Cardiology is consulted for elevated troponin and CHF.  ? ? ?  ? ? ?Past Medical History:  ?Diagnosis Date  ? Anemia   ? Anxiety   ? Bilateral carotid artery stenosis   ? Carotid duplex 09/7515: 0-01% LICA, 74-94% RICA, >49% RECA, f/u 1 yr suggested  ? CAD (coronary artery disease) of bypass graft 5/01; 3/'02, 8/'03, 10/'04; 1/15  ? PCI x 5 to SVG-D1   ? CAD in native artery 07/1993  ? 3 Vessel Disease (LAD-D1 & RCA) -- CABG (Dx in setting of inferior STEMI-PTCA of RCA)  ? COPD mixed type ([place])   ? Followed by Katherine. Lamonte [name] "pulmonologist said no COPD"  ? Depression with anxiety   ? Diabetes mellitus type 2 in obese [facility])   ? Diarrhea   ? started after cholecystectomy and mass removed from intestine  ? Dyslipidemia, goal LDL below 70   ? 08/2012: TC 137, TG 200, HDL 32!, LDL 45; on statin (followed by KatherineDeterding)  ? ESRD (end stage renal disease) ([place]) 1991  ? s/p Cadaveric Renal Transplant [facility] - Katherine. Jimmy [name])   ? Family history of adverse reaction to anesthesia   ? mom's bp dropped during/after anesthesia  ? Fibromyalgia   ? GERD (gastroesophageal reflux disease)   ? 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 07/1993  ? H/O: GI bleed   ? History of kidney stones   ? History of stroke 2012  ? "right eye stroke- half blind now"  ? 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 Witnessed syncopal event.  Had having having lots of nausea and vomiting with poor p.o. intake.   Thought to have QT prolongation with multiple medications involved and hypomagnesemia, hypokalemia.  Tikosyn discontinued along with Zoloft and Phenergan.  ? Hypertension associated with diabetes ([place])   ? Mild aortic stenosis by prior echocardiogram 07/2019  ? Echo:  Mild aortic stenosis (gradients: Mean 14.3 mmHg -peak 24.9 mmHg).  ? Morbid obesity ([place])   ? MRSA (methicillin resistant staph aureus) culture positive   ? OSA (obstructive sleep apnea)   ? no longer on CPAP or home O2, states she doesn't need now after lap band  ? PAD (peripheral artery disease) ([place]) 08/2013  ? LEA Dopplers to be read by Katherine. Fletcher Anon  ? PAF (paroxysmal atrial fibrillation) ([place]) 06/2014  ? Noted on CardioNet Monitor  - --> rhythm control with Tikosyn (Katherine. Rayann Heman); converted from warfarin to apixaban for anticoagulation.  ? Pneumonia   ? Recurrent boils   ? Bilateral Groin  ? Rheumatoid arthritis ([place])   ? Per Patient Report; associated with  OA  ? S/p cadaver renal transplant 1991  ? [place]  ? ? ?Past Surgical History:  ?Procedure Laterality Date  ? ABDOMINAL AORTOGRAM N/A 04/21/2018  ? Procedure: ABDOMINAL AORTOGRAM;  Surgeon: [name], MD;  Location: [place] CV [facility];  Service: Cardiovascular;  Laterality: N/A;  ? CATHETER REMOVAL    ? CHOLECYSTECTOMY N/A 10/29/2014  ? Procedure: LAPAROSCOPIC CHOLECYSTECTOMY WITH INTRAOPERATIVE CHOLANGIOGRAM;  Surgeon: Excell Seltzer, MD;  Location: [facility];  Service: General;  Laterality: N/A;  ? [place]  ? x5  ? CORONARY ARTERY BYPASS GRAFT  1995  ? [name]-LAD, SVG-RPDA, SVG-D1  ? CORONARY STENT INTERVENTION N/A 02/20/2021  ? Procedure: PERCUTANEOUS CORONARY STENT INTERVENTION;  Surgeon: [name], MD;  Location: [place] INVASIVE CV LAB; ostLCx 60% (Neg RFR 0.96);; SVG- D2 recurrent 90% ISR & 95% native D2 after graft-> DES PCI of 95% anastomotic D2 lesion (On[place] 2.25 mm x 12 mm => 2.75 mm @ overlap, 2.5 distal.);PTCA of ISR in body of graft. ->  2.5 mm scoring  balloon & post-dil w/ 2.75 mm [place] balloon  ? ESOPHAGOGASTRODUODENOSCOPY N/A 10/15/2016  ? Procedure: ESOPHAGOGASTRODUODENOSCOPY (EGD);  Surgeon: Wilford Corner, MD;  Location: [facility] END[facility]COPY;  Service: Endoscopy

## 2021-12-08 NOTE — Consult Note (Signed)
[place] Nurse wound consult note ?Consultation was completed by review of records, images and assistance from the bedside nurse/clinical staff.  ?Reason for Consult: heel ulcer ?Wound type:Stage 3 Pressure Injury: full thickness ?Pressure Injury POA: Yes ?Measurement: see nursing flow sheet  ?Wound bed: epithelial bud, pink, moist ?Drainage (amount, consistency, odor) unable to assess, however the periwound is macerated which would indicated drainage or too much moisture being placed on this wound ?Periwound: macerated  ?Dressing procedure/placement/frequency:  ?Clean wound with saline, pat dry ?Apply single layer of xeroform over wound bed, top with foam ?Offload heel at all times; use Prevalon boot if needed.  ? ?Discussed POC with patient and bedside nurse.  ?Re consult if needed, will not follow at this time. ?Thanks ? Ocie Stanzione [facility] MSN, RN,CWOCN, CNS, CWON-AP [phone])  ?

## 2021-12-08 NOTE — Progress Notes (Addendum)
?PROGRESS NOTE ? ? ? ?Katherine [name]  VEH:[id number] DOB: 10/25/1955 DOA: 12/07/2021 ?PCP: Katherine Lima, MD  ? ? ?Chief Complaint  ?Patient presents with  ? Respiratory Distress  ? ? ?Brief Narrative:  ? ? Katherine [name] is a 66 y.o. female with medical history significant of chronic respiratory failure, COPD, asthma, CAD status post stenting, CABG and in grafts stenting, PAD, carotid artery disease, hyperlipidemia, OSA, obesity status post lap band, paroxysmal A-fib, polymorphic [name], CHF, GERD, ESRD status post renal transplant, hypertension, anemia, depression, anxiety, CVA presenting with worsening shortness of breath. ?Patient reports 2 days of progressive shortness of breath.  She is on 5 L chronically at home and has had to increase fluids over the past 2 days and has remained short of breath.  She was requiring up to 10 L oxygen upon admission, as well her work-up was noted for significant trend up and troponins with concern of  NSTEMI's, so she was admitted for further work up. ? ? ?Assessment & Plan: ?  ?Principal Problem: ?  Acute on chronic respiratory failure with hypoxia ([place]) ?Active Problems: ?  CAD S/P percutaneous coronary angioplasty - PCI x 5 to SVG-D1 ?  Extrinsic asthma ?  GERD ?  Morbid obesity - s/p Lap Band 9/'05 ?  Hx of CABG ?  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  Dyslipidemia, goal LDL below 70 ?  Essential hypertension ?  PAD (peripheral artery disease) ([place]) ?  Paroxysmal atrial fibrillation ([place]); CHA2DS2VASc score F, HTN, CAD, CVA = 5 ?  Type 2 diabetes mellitus with hyperlipidemia ([place]) ?  ESRD (end stage renal disease) ([place]) ?  OSA (obstructive sleep apnea) ?  Anxiety and depression ?  Chronic respiratory failure with hypoxia (HCC) ?  COPD (chronic obstructive pulmonary disease) ([place]) ?  History of renal transplant ?  Chronic diastolic CHF (congestive heart failure) ([place]) ?  CHF exacerbation ([place]) ?  COPD with acute exacerbation ([place]) ? ? ?Acute on  chronic respiratory failure with hypoxia ?COPD/Asthma exacerbation ?CHF exacerbation ?- Patient presenting with respiratory failure with hypoxia and increased oxygen demand at home. ?- Baseline oxygen requirement with chronic respiratory failure on 5 L, has had increases as high as 10 L at home.  Was on 8 L here earlier but has been weaned to 6 L in the ED. ?-He was encouraged to use incentive spirometry and flutter valve. ? ? ?Acute COPD/asthma exacerbation ?-Wheezing has improved, continue with current dose Solu-Medrol 80 mg IV every 24 hours(she is on prednisone 5 mg oral daily given history of renal transplant0. ? ?Acute on chronic systolic CHF ?-BNP significantly elevated at 1300 ?- Continue with IV Lasix twice daily ?- Strict I's and O's and daily weights ?- Continue home metoprolol ?- cardiology consulted ? ?Elevated troponins, concern for NSTEMI  ?-Troponins trending up, 15>36> 969> 3156, concern for ACS, ?-She is on heparin drip currently, received aspirin, she is on Plavix, Imdur, metoprolol and statin ?-Further management per cardiology. ?  ?CAD ?PAD ?Carotid artery disease ?Hyperlipidemia ?-  Significant CAD history with multiple stenting's, status post CABG, history of in graft stenosis and stenting. ?- Has had recent NSTEMI  ?- Continue home Plavix, Eliquis( now on heparin GTT) ?- Continue home Ranexa, metoprolol, Imdur ?- Continue home rosuvastatin ?  ?Hypertension ?- Continue home metoprolol ?- Receiving IV Lasix as above ?  ?Paroxysmal A-fib ?- Continue home metoprolol, Eliquis ?  ?Chronic pain ?- Has received a few doses of fentanyl  in the ED ?- Continue home as needed oxycodone ? ?GERD ?- Continue home PPI ? ?History of ESRD now status post renal transplant in 1991 ?- Continue home Imuran ?- Currently on IV solumedrol  (baseline dose 5 mg daily) ?  ?Anemia ?- Hemoglobin stable 11.1 in the ED. ?- Trend CBC ? ?Anxiety ?Depression ?- Continue home Zyprexa, Zoloft, Lamictal ?- Continue home Ativan as  needed ?  ?History of CVA ?- Continue home Plavix, rosuvastatin ?  ?Morbid obesity status post lap band ?- Noted ? ?Right heel pressure ulcer: ?- wound care consulted ? ?DVT prophylaxis: Eliquis >> heparin GTT ?Code Status: DNR ?Family Communication: none at bedside ?Disposition:  ? ?Status is: Observation ?The patient will require care spanning > 2 midnights and should be moved to inpatient because: Respiratory failure, on IV steroids, working diagnosis of NSTEMI, on heparin  ?  ?Consultants:  ? Cardiology ? ?Subjective: ? ?Dyspnea, generalized weakness and fatigue, she denies chest pain currently. ? ?Objective: ?Vitals:  ? 12/08/21 0430 12/08/21 0500 12/08/21 0600 12/08/21 0959  ?BP: 133/87 139/81 113/73 (!) 143/85  ?Pulse: 91 82 85 89  ?Resp: (!) 27 19 (!) 25 20  ?Temp:      ?TempSrc:      ?SpO2: 98% 93% 97% 99%  ?Weight:      ?Height:      ? ? ?Intake/Output Summary (Last 24 hours) at 12/08/2021 1008 ?Last data filed at 12/08/2021 0359 ?Gross per 24 hour  ?Intake 3 ml  ?Output 1000 ml  ?Net -997 ml  ? ?Filed Weights  ? 12/07/21 2104  ?Weight: 81.6 kg  ? ? ?Examination: ? ?Awake Alert, Oriented X 3, pale, chronically ill-appearing. ?Symmetrical Chest wall movement, no respiratory distress, wheezing has improved ?RRR,No Gallops,Rubs or new Murmurs, No Parasternal Heave ?+ve B.Sounds, Abd Soft, No tenderness, No rebound - guarding or rigidity. ?No Cyanosis, Clubbing or edema, No new Rash or bruise   ? ? ? ? ?Data Reviewed: I have personally reviewed following labs and imaging studies ? ?CBC: ?Recent Labs  ?Lab 12/07/21 ?2125 12/08/21 ?0025 12/08/21 ?0434  ?WBC 9.1  --  7.1  ?NEUTROABS 7.8*  --   --   ?HGB 11.1* 12.6 11.3*  ?HCT 36.3 37.0 36.7  ?MCV 89.4  --  88.0  ?PLT 231  --  280  ? ? ?Basic Metabolic Panel: ?Recent Labs  ?Lab 12/07/21 ?2125 12/08/21 ?0019 12/08/21 ?0025 12/08/21 ?0241  ?NA 132*  --  133* 132*  ?K 4.9  --  4.7 4.8  ?CL 100  --   --  98  ?CO2 22  --   --  23  ?GLUCOSE 331*  --   --  407*  ?BUN 10   --   --  11  ?CREATININE 0.95  --   --  1.00  ?CALCIUM 9.1  --   --  9.0  ?MG  --  2.2  --   --   ? ? ?GFR: ?Estimated Creatinine Clearance: 55 mL/min (by C-G formula based on SCr of 1 mg/dL). ? ?Liver Function Tests: ?Recent Labs  ?Lab 12/08/21 ?0241  ?AST 28  ?ALT 9  ?ALKPHOS 119  ?BILITOT 0.8  ?PROT 7.2  ?ALBUMIN 2.6*  ? ? ?CBG: ?No results for input(s): GLUCAP in the last 168 hours. ? ? ?Recent Results (from the past 240 hour(s))  ?Respiratory (~20 pathogens) panel by PCR     Status: None  ? Collection Time: 12/07/21 11:17 PM  ? Specimen: Nasopharyngeal Swab; Respiratory  ?  Result Value Ref Range Status  ? Adenovirus NOT DETECTED NOT DETECTED Final  ? Coronavirus 229E NOT DETECTED NOT DETECTED Final  ?  Comment: (NOTE) ?The Coronavirus on the Respiratory Panel, DOES NOT test for the novel  ?Coronavirus (2019 nCoV) ?  ? Coronavirus HKU1 NOT DETECTED NOT DETECTED Final  ? Coronavirus NL63 NOT DETECTED NOT DETECTED Final  ? Coronavirus OC43 NOT DETECTED NOT DETECTED Final  ? Metapneumovirus NOT DETECTED NOT DETECTED Final  ? Rhinovirus / Enterovirus NOT DETECTED NOT DETECTED Final  ? Influenza A NOT DETECTED NOT DETECTED Final  ? Influenza B NOT DETECTED NOT DETECTED Final  ? Parainfluenza Virus 1 NOT DETECTED NOT DETECTED Final  ? Parainfluenza Virus 2 NOT DETECTED NOT DETECTED Final  ? Parainfluenza Virus 3 NOT DETECTED NOT DETECTED Final  ? Parainfluenza Virus 4 NOT DETECTED NOT DETECTED Final  ? Respiratory Syncytial Virus NOT DETECTED NOT DETECTED Final  ? Bordetella pertussis NOT DETECTED NOT DETECTED Final  ? Bordetella Parapertussis NOT DETECTED NOT DETECTED Final  ? Chlamydophila pneumoniae NOT DETECTED NOT DETECTED Final  ? Mycoplasma pneumoniae NOT DETECTED NOT DETECTED Final  ?  Comment: Performed at [facility], [place] [address] [place], [place] [address]  ?  ? ? ? ? ? ?Radiology Studies: ?DG Chest Port 1 View ? ?Result Date: 12/07/2021 ?CLINICAL DATA:  Shortness of breath EXAM: PORTABLE CHEST 1  VIEW COMPARISON:  11/21/2021 FINDINGS: Unchanged mild interstitial pulmonary edema and cardiomegaly. No pneumothorax or sizable pleural effusion. Remote median sternotomy and CABG. IMPRESSION: Unchanged card

## 2021-12-08 NOTE — Progress Notes (Signed)
TRH night cross cover note: ? ?I was notified by RN of updated troponin value this morning of 916.  RN confirms that the patient is chest pain-free.  ? ?Per my chart review, this is a 66 year old female with known history of CAD status post CABG followed by PCI with stents, recent NSTEMI in February 2023 (troponin at that time peaked at 3000), along with history of chronic hypoxic respiratory failure on continuous 5 L nasal cannula at baseline in addition to COPD, paroxysmal atrial fibrillation on Eliquis, who is admitted overnight for acute on chronic hypoxic respiratory failure in the setting of suspected acute COPD exacerbation and acute on chronic congestive heart failure.  Started on IV diuresis.  Initial troponin this evening was 15 followed by 36.  ? ?In light of the updated troponin resolved, I have ordered repeat EKG, echocardiogram in the morning, initiation of heparin drip.  Also started patient's home Eliquis, and ordered full dose aspirin x1 dose.  Of note, she remains on room Lopressor, high intensity rosuvastatin.  Additionally, I have requested cardiology consult this morning.  Repeat troponin ordered for later this morning. ? ? ? ? ?Babs Bertin, DO ?Hospitalist ? ?

## 2021-12-08 NOTE — Progress Notes (Signed)
Patient stated having 5/10 chest pain. She says nitro helps her. No orders in, paged Cardiology MD.Gave pain medication for the moment, awaiting for orders. Will continue to monitor patient.  ?

## 2021-12-08 NOTE — Progress Notes (Signed)
Heart Failure Navigator Progress Note ? ?Following this hospitalization to assess for HV TOC readiness.  ? ?ECHO pending?  ? ?COPD vs CHF ? ?Earnestine Leys, BSN, RN ?Heart Failure Nurse Navigator ?Secure Chat Only  ?

## 2021-12-08 NOTE — ED Notes (Signed)
C/o pain in right heel , broken down dressing applied and leg elevated with pillow.  ?

## 2021-12-08 NOTE — ED Notes (Signed)
Breakfast order placed ?

## 2021-12-08 NOTE — Progress Notes (Signed)
Inpatient Diabetes Program Recommendations ? ?AACE/ADA: New Consensus Statement on Inpatient Glycemic Control (2015) ? ?Target Ranges:  Prepandial:   less than 140 mg/dL ?     Peak postprandial:   less than 180 mg/dL (1-2 hours) ?     Critically ill patients:  140 - 180 mg/dL  ? ? Latest Reference Range & Units 12/07/21 21:25 12/08/21 02:41  ?Glucose 70 - 99 mg/dL 331 (H) 407 (H)  ?(H): Data is abnormally high ? Latest Reference Range & Units 10/20/21 04:37  ?Hemoglobin A1C 4.8 - 5.6 % 7.8 (H)  ?(H): Data is abnormally high ? ? ? ?Admit with:  ?Acute on chronic respiratory failure with hypoxia ?COPD/Asthma exacerbation ?CHF exacerbation ? ?History: DM2, COPD, ESRD/ Renal Transplant ? ?Home DM Meds: Basaglar 20 units daily ?      Novolog 4-12 units TID per SSI ?      Amaryl 4 mg daily ? ?Current Orders: None yet ?     Prednisone 50 mg daily ? ? ? ? ?MD- Note patient with Hx of Diabetes ? ?No Insulin orders placed yet ? ?Please consider: ? ?1. Start Semglee 15 units Daily (75% home dose to start)--please start this AM ? ?2. Start Novolog Sensitive Correction Scale/ SSI (0-9 units) TID AC + HS ? ? ? ? ?--Will follow patient during hospitalization-- ? ?Wyn Quaker RN, MSN, CDE ?Diabetes Coordinator ?Inpatient Glycemic Control Team ?Team Pager: [phone] (8a-5p) ? ? ? ?

## 2021-12-09 ENCOUNTER — Telehealth: Payer: Self-pay

## 2021-12-09 DIAGNOSIS — I214 Non-ST elevation (NSTEMI) myocardial infarction: Secondary | ICD-10-CM | POA: Diagnosis not present

## 2021-12-09 DIAGNOSIS — J9621 Acute and chronic respiratory failure with hypoxia: Secondary | ICD-10-CM | POA: Diagnosis not present

## 2021-12-09 DIAGNOSIS — I5043 Acute on chronic combined systolic (congestive) and diastolic (congestive) heart failure: Secondary | ICD-10-CM | POA: Diagnosis not present

## 2021-12-09 DIAGNOSIS — J441 Chronic obstructive pulmonary disease with (acute) exacerbation: Secondary | ICD-10-CM | POA: Diagnosis not present

## 2021-12-09 DIAGNOSIS — I5023 Acute on chronic systolic (congestive) heart failure: Secondary | ICD-10-CM | POA: Diagnosis not present

## 2021-12-09 LAB — GLUCOSE, CAPILLARY
Glucose-Capillary: 353 mg/dL — ABNORMAL HIGH (ref 70–99)
Glucose-Capillary: 459 mg/dL — ABNORMAL HIGH (ref 70–99)
Glucose-Capillary: 498 mg/dL — ABNORMAL HIGH (ref 70–99)

## 2021-12-09 LAB — CBC
HCT: 31.5 % — ABNORMAL LOW (ref 36.0–46.0)
Hemoglobin: 9.7 g/dL — ABNORMAL LOW (ref 12.0–15.0)
MCH: 26.9 pg (ref 26.0–34.0)
MCHC: 30.8 g/dL (ref 30.0–36.0)
MCV: 87.5 fL (ref 80.0–100.0)
Platelets: 279 10*3/uL (ref 150–400)
RBC: 3.6 MIL/uL — ABNORMAL LOW (ref 3.87–5.11)
RDW: 19.6 % — ABNORMAL HIGH (ref 11.5–15.5)
WBC: 10.6 10*3/uL — ABNORMAL HIGH (ref 4.0–10.5)
nRBC: 0 % (ref 0.0–0.2)

## 2021-12-09 LAB — ECHOCARDIOGRAM COMPLETE
AR max vel: 1.47 cm2
AV Area VTI: 1.34 cm2
AV Area mean vel: 1.39 cm2
AV Mean grad: 10 mmHg
AV Peak grad: 19.4 mmHg
Ao pk vel: 2.2 m/s
Calc EF: 37 %
Height: 61 in
S' Lateral: 3.3 cm
Single Plane A2C EF: 41.2 %
Single Plane A4C EF: 33.9 %
Weight: 2880 oz

## 2021-12-09 LAB — BASIC METABOLIC PANEL
Anion gap: 10 (ref 5–15)
BUN: 18 mg/dL (ref 8–23)
CO2: 28 mmol/L (ref 22–32)
Calcium: 8.8 mg/dL — ABNORMAL LOW (ref 8.9–10.3)
Chloride: 94 mmol/L — ABNORMAL LOW (ref 98–111)
Creatinine, Ser: 1.01 mg/dL — ABNORMAL HIGH (ref 0.44–1.00)
GFR, Estimated: >60 mL/min (ref >60)
Glucose, Bld: 358 mg/dL — ABNORMAL HIGH (ref 70–99)
Potassium: 3.4 mmol/L — ABNORMAL LOW (ref 3.5–5.1)
Sodium: 132 mmol/L — ABNORMAL LOW (ref 135–145)

## 2021-12-09 LAB — HEPARIN LEVEL (UNFRACTIONATED)
Heparin Unfractionated: >1.1 IU/mL — ABNORMAL HIGH (ref 0.30–0.70)
Heparin Unfractionated: >1.1 IU/mL — ABNORMAL HIGH (ref 0.30–0.70)

## 2021-12-09 LAB — APTT
aPTT: 72 seconds — ABNORMAL HIGH (ref 24–36)
aPTT: 42 seconds — ABNORMAL HIGH (ref 24–36)
aPTT: 131 seconds — ABNORMAL HIGH (ref 24–36)
aPTT: 131 seconds — ABNORMAL HIGH (ref 24–36)

## 2021-12-09 MED ORDER — IPRATROPIUM-ALBUTEROL 0.5-2.5 (3) MG/3ML IN SOLN
3.0000 mL | Freq: Two times a day (BID) | RESPIRATORY_TRACT | Status: DC
  Administered 2021-12-09 – 2021-12-10 (×4): 3 mL via RESPIRATORY_TRACT
  Filled 2021-12-09 (×5): qty 3

## 2021-12-09 MED ORDER — INSULIN GLARGINE-YFGN 100 UNIT/ML ~~LOC~~ SOLN
20.0000 [IU] | Freq: Every day | SUBCUTANEOUS | Status: DC
  Administered 2021-12-10 – 2021-12-12 (×3): 20 [IU] via SUBCUTANEOUS
  Filled 2021-12-09 (×5): qty 0.2

## 2021-12-09 MED ORDER — POTASSIUM CHLORIDE CRYS ER 20 MEQ PO TBCR
20.0000 meq | EXTENDED_RELEASE_TABLET | Freq: Two times a day (BID) | ORAL | Status: DC
  Administered 2021-12-09 – 2021-12-12 (×8): 20 meq via ORAL
  Filled 2021-12-09 (×8): qty 1

## 2021-12-09 MED ORDER — INSULIN ASPART 100 UNIT/ML IJ SOLN
8.0000 [IU] | Freq: Once | INTRAMUSCULAR | Status: AC
  Administered 2021-12-09: 8 [IU] via SUBCUTANEOUS

## 2021-12-09 MED ORDER — PREDNISONE 20 MG PO TABS
40.0000 mg | ORAL_TABLET | Freq: Every day | ORAL | Status: DC
  Administered 2021-12-10: 40 mg via ORAL
  Filled 2021-12-09: qty 2

## 2021-12-09 NOTE — Consult Note (Addendum)
? ?  [facility] CM Inpatient Consult ? ? ?12/09/2021 ? ?[name] ?1955/11/21 ?[id number] ? ?[facility] [place] Organization [ACO] Patient: Medicare ACO Reach ? ?Referral:  Request for review for readmission ? ?Primary Care Provider:  [name], MD, [place], is an embedded provider with a Chronic Care Management team and program, and is listed for the transition of care follow up and appointments. ? ?Patient was screened for less than 30 days readmission with an extreme high risk score for unplanned readmission.  Patient is noted as active with the Embedded Pharmacist for chronic care management [addendum] in the past as well as CCM RNCM.  Patient was referred for hospice/palliative care noted. ? ?Plan: Information sent for readmission review with [facility] and will follow for additional Embedded needs, if appropriate. Follow for progress/needs. ? ?Please contact for further questions, ? ?Natividad Brood, RN BSN CCM ?[facility] Liaison ? [phone] business mobile phone ?Toll free office [phone]  ?Fax number: [phone] ?[name] ? ? ? ?

## 2021-12-09 NOTE — Plan of Care (Signed)
?  Problem: Education: ?Goal: Ability to verbalize understanding of medication therapies will improve ?Outcome: Progressing ?  ?Problem: Education: ?Goal: Knowledge of disease or condition will improve ?Outcome: Progressing ?  ?

## 2021-12-09 NOTE — Telephone Encounter (Signed)
Pt called to report that she is in the [facility] and cx tomorrow appt. Pt states she had a Heart attack and wanted Dr. Ronnald Ramp to be informed. ? ?I advised pt when she made aware of the D/C date to call back and make a HOS FU. ? ?FYI ?

## 2021-12-09 NOTE — Progress Notes (Signed)
? ?Progress Note ? ?Patient Name: Katherine [name] ?Date of Encounter: 12/09/2021 ? ?[facility] Cardiologist: [name], MD  ? ?Subjective  ? ?Still feeling tightness in her chest.  No other acute complaints. ? ?Inpatient Medications  ?  ?Scheduled Meds: ? arformoterol  15 mcg Nebulization BID  ? azaTHIOprine  125 mg Oral Daily  ? budesonide  2 mL Nebulization BID  ? calcitRIOL  0.25 mcg Oral Q72H  ? clopidogrel  75 mg Oral Daily  ? furosemide  40 mg Intravenous BID  ? gabapentin  100 mg Oral QHS  ? insulin aspart  0-5 Units Subcutaneous QHS  ? insulin aspart  0-9 Units Subcutaneous TID WC  ? insulin glargine-yfgn  15 Units Subcutaneous Daily  ? ipratropium-albuterol  3 mL Nebulization BID  ? isosorbide mononitrate  60 mg Oral Daily  ? lamoTRIgine  25 mg Oral QHS  ? methylPREDNISolone (SOLU-MEDROL) injection  80 mg Intravenous Daily  ? metoprolol tartrate  25 mg Oral Q8H  ? montelukast  10 mg Oral QHS  ? OLANZapine  5 mg Oral QHS  ? pantoprazole  40 mg Oral Daily  ? ranolazine  1,000 mg Oral BID  ? rosuvastatin  20 mg Oral Daily  ? sertraline  100 mg Oral Daily  ? sodium chloride flush  3 mL Intravenous Q12H  ? ?Continuous Infusions: ? heparin 1,200 Units/hr (12/08/21 2239)  ? ?PRN Meds: ?acetaminophen **OR** acetaminophen, albuterol, LORazepam, nitroGLYCERIN, oxyCODONE  ? ?Vital Signs  ?  ?Vitals:  ? 12/09/21 0317 12/09/21 6269 12/09/21 0740 12/09/21 0801  ?BP: 125/67 122/70 106/80   ?Pulse: 100 82 (!) 104   ?Resp: 14  20   ?Temp: 97.6 ?F (36.4 ?C)  97.7 ?F (36.5 ?C)   ?TempSrc: Oral  Oral   ?SpO2: 100%  96% 97%  ?Weight: 86.3 kg     ?Height:      ? ? ?Intake/Output Summary (Last 24 hours) at 12/09/2021 0850 ?Last data filed at 12/09/2021 4854 ?Gross per 24 hour  ?Intake 747.93 ml  ?Output 1900 ml  ?Net -1152.07 ml  ? ? ?  12/09/2021  ?  3:17 AM 12/08/2021  ?  5:08 PM 12/07/2021  ?  9:04 PM  ?Last 3 Weights  ?Weight (lbs) 190 lb 4.1 oz 189 lb 4.8 oz 180 lb  ?Weight (kg) 86.3 kg 85.866 kg 81.647 kg  ?    ? ?Telemetry  ?  ?Atrial fibrillation with heart rate currently in the 70s - Personally Reviewed ? ? ?Physical Exam  ?Alert, oriented, no distress ?GEN: No acute distress.   ?Neck: No JVD ?Cardiac: Irregular, no murmurs, rubs, or gallops.  ?Respiratory: Clear to auscultation bilaterally. ?GI: Soft, nontender, non-distended  ?MS: No edema; No deformity. ?Neuro:  Nonfocal  ?Psych: Normal affect  ? ?Labs  ?  ?High Sensitivity Troponin:   ?Recent Labs  ?Lab 12/07/21 ?2125 12/08/21 ?0019 12/08/21 ?0241 12/08/21 ?0838  ?TROPONINIHS 15 36* 969* 3,156*  ?   ?Chemistry ?Recent Labs  ?Lab 12/07/21 ?2125 12/08/21 ?0019 12/08/21 ?0025 12/08/21 ?0241 12/09/21 ?6270  ?NA 132*  --  133* 132* 132*  ?K 4.9  --  4.7 4.8 3.4*  ?CL 100  --   --  98 94*  ?CO2 22  --   --  23 28  ?GLUCOSE 331*  --   --  407* 358*  ?BUN 10  --   --  11 18  ?CREATININE 0.95  --   --  1.00 1.01*  ?CALCIUM 9.1  --   --  9.0 8.8*  ?MG  --  2.2  --   --   --   ?PROT  --   --   --  7.2  --   ?ALBUMIN  --   --   --  2.6*  --   ?AST  --   --   --  28  --   ?ALT  --   --   --  9  --   ?ALKPHOS  --   --   --  119  --   ?BILITOT  --   --   --  0.8  --   ?GFRNONAA >60  --   --  >60 >60  ?ANIONGAP 10  --   --  11 10  ?  ?Lipids No results for input(s): CHOL, TRIG, HDL, LABVLDL, LDLCALC, CHOLHDL in the last 168 hours.  ?Hematology ?Recent Labs  ?Lab 12/07/21 ?2125 12/08/21 ?0025 12/08/21 ?0434 12/09/21 ?4825  ?WBC 9.1  --  7.1 10.6*  ?RBC 4.06  --  4.17 3.60*  ?HGB 11.1* 12.6 11.3* 9.7*  ?HCT 36.3 37.0 36.7 31.5*  ?MCV 89.4  --  88.0 87.5  ?MCH 27.3  --  27.1 26.9  ?MCHC 30.6  --  30.8 30.8  ?RDW 19.8*  --  19.7* 19.6*  ?PLT 231  --  280 279  ? ?Thyroid No results for input(s): TSH, FREET4 in the last 168 hours.  ?BNP ?Recent Labs  ?Lab 12/07/21 ?2125  ?BNP 1,347.1*  ?  ?DDimer No results for input(s): DDIMER in the last 168 hours.  ? ?Radiology  ?  ?DG Chest Port 1 View ? ?Result Date: 12/07/2021 ?CLINICAL DATA:  Shortness of breath EXAM: PORTABLE CHEST 1 VIEW  COMPARISON:  11/21/2021 FINDINGS: Unchanged mild interstitial pulmonary edema and cardiomegaly. No pneumothorax or sizable pleural effusion. Remote median sternotomy and CABG. IMPRESSION: Unchanged cardiomegaly and mild interstitial pulmonary edema. Electronically Signed   By: Ulyses Jarred M.D.   On: 12/07/2021 21:35   ? ?Cardiac Studies  ? ?2D echo performed yesterday afternoon with interpretation currently pending ? ?Patient Profile  ?   ?66 y.o. female with multiple advanced medical problems including coronary artery disease status post CABG with multiple PCI procedures, failed attempts at catheterization and PCI due to poor vascular access, oxygen dependent COPD, and acute on chronic combined systolic and diastolic heart failure. ? ?Assessment & Plan  ?  ?1.  Acute on chronic combined systolic and diastolic heart failure: Patient initially noted to have cardiomegaly and interstitial edema on x-ray.  BNP significantly elevated.  Would continue IV furosemide today.  Await 2D echo interpretation.  Patient treated with metoprolol and isosorbide.  If decline in LVEF, will adjust medical therapy. ? ?2.  Non-STEMI: Possibility of no occlusive disease versus demand ischemia.  Reviewed multiple cardiac catheterization reports.  The patient is not a candidate for further catheterization due to severe vascular disease with no access.  Continue medical management.  Should complete 48 hours of IV heparin tomorrow.  Increase ranolazine from 500 mg to 1000 mg twice daily.  Continue isosorbide and metoprolol at current doses. ? ?3.  Persistent atrial fibrillation: Resume apixaban once IV heparin completed.  Heart rate well controlled today. ? ?For questions or updates, please contact [place] ?Please consult [email or web address] for contact info under  ? ?Signed, ?Sherren Mocha, MD  ?12/09/2021, 8:50 AM   ? ?

## 2021-12-09 NOTE — Progress Notes (Addendum)
ANTICOAGULATION CONSULT NOTE ? ?Pharmacy Consult for Heparin (Apixaban on hold) ?Indication: Elevated troponin, afib ? ?Allergies  ?Allergen Reactions  ? Tetracycline Hives  ?  Patient tolerated Doxycycline Dec 2020  ? Niacin Other (See Comments)  ?  Mouth blisters  ? Niaspan [Niacin Er] Other (See Comments)  ?  Mouth blisters  ? Sulfa Antibiotics Nausea Only and Other (See Comments)  ?  "Tears up stomach"  ? Sulfonamide Derivatives Other (See Comments)  ?  Reaction: per patient "tears her stomach up"  ? Codeine Nausea And Vomiting  ? Erythromycin Nausea And Vomiting  ? Hydromorphone Hcl Nausea And Vomiting  ? Morphine And Related Nausea And Vomiting  ? Nalbuphine Nausea And Vomiting  ?  Nubain  ? Sulfasalazine Nausea Only and Other (See Comments)  ?  per patient "tears her stomach up", "Tears up stomach"  ? Tape Rash and Other (See Comments)  ?  No "plastic" tape," please----cloth tape only  ? ? ?Patient Measurements: ?Height: '5\' 1"'$  (154.9 cm) ?Weight: 86.3 kg (190 lb 4.1 oz) ?IBW/kg (Calculated) : 47.8 ?Heparin dosing weight: 67.7 kg ? ?Vital Signs: ?Temp: 98.4 ?F (36.9 ?C) (04/25 1957) ?Temp Source: Oral (04/25 1957) ?BP: 135/80 (04/25 1957) ?Pulse Rate: 100 (04/25 1957) ? ?Labs: ?Recent Labs  ?  12/07/21 ?2125 12/08/21 ?0019 12/08/21 ?0025 12/08/21 ?0241 12/08/21 ?0434 12/08/21 ?8527 12/08/21 ?2115 12/08/21 ?2115 12/09/21 ?7824 12/09/21 ?1343 12/09/21 ?2111  ?HGB 11.1*  --  12.6  --  11.3*  --   --   --  9.7*  --   --   ?HCT 36.3  --  37.0  --  36.7  --   --   --  31.5*  --   --   ?PLT 231  --   --   --  280  --   --   --  279  --   --   ?APTT  --   --   --   --   --   --  46*   < > 72* 42* 131*  ?HEPARINUNFRC  --   --   --   --   --   --  >1.10*  --  >1.10* >1.10*  --   ?CREATININE 0.95  --   --  1.00  --   --   --   --  1.01*  --   --   ?TROPONINIHS 15 36*  --  969*  --  3,156*  --   --   --   --   --   ? < > = values in this interval not displayed.  ? ? ? ?Estimated Creatinine Clearance: 56.1 mL/min (A) (by  C-G formula based on SCr of 1.01 mg/dL (H)). ? ? ?Medical History: ?Past Medical History:  ?Diagnosis Date  ? Anemia   ? Anxiety   ? Bilateral carotid artery stenosis   ? Carotid duplex 09/3534: 1-44% LICA, 31-54% RICA, >00% RECA, f/u 1 yr suggested  ? CAD (coronary artery disease) of bypass graft 5/01; 3/'02, 8/'03, 10/'04; 1/15  ? PCI x 5 to SVG-D1   ? CAD in native artery 07/1993  ? 3 Vessel Disease (LAD-D1 & RCA) -- CABG (Dx in setting of inferior STEMI-PTCA of RCA)  ? COPD mixed type ([place])   ? Followed by Dr. Lamonte Sakai "pulmonologist said no COPD"  ? Depression with anxiety   ? Diabetes mellitus type 2 in obese [facility])   ? Diarrhea   ? started after cholecystectomy  and mass removed from intestine  ? Dyslipidemia, goal LDL below 70   ? 08/2012: TC 137, TG 200, HDL 32!, LDL 45; on statin (followed by Dr.Deterding)  ? ESRD (end stage renal disease) ([place]) 1991  ? s/p Cadaveric Renal Transplant [facility] - Dr. Jimmy Footman)   ? Family history of adverse reaction to anesthesia   ? mom's bp dropped during/after anesthesia  ? Fibromyalgia   ? GERD (gastroesophageal reflux disease)   ? 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 07/1993  ? H/O: GI bleed   ? History of kidney stones   ? History of stroke 2012  ? "right eye stroke- half blind now"  ? 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 Witnessed syncopal event.  Had having having lots of nausea and vomiting with poor p.o. intake.  Thought to have QT prolongation with multiple medications involved and hypomagnesemia, hypokalemia.  Tikosyn discontinued along with Zoloft and Phenergan.  ? Hypertension associated with diabetes ([place])   ? Mild aortic stenosis by prior echocardiogram 07/2019  ? Echo:  Mild aortic stenosis (gradients: Mean 14.3 mmHg -peak 24.9 mmHg).  ? Morbid obesity ([place])   ? MRSA (methicillin resistant staph aureus) culture positive   ? OSA (obstructive sleep apnea)   ? no longer on CPAP or home O2, states she doesn't need now  after lap band  ? PAD (peripheral artery disease) ([place]) 08/2013  ? LEA Dopplers to be read by Dr. Fletcher Anon  ? PAF (paroxysmal atrial fibrillation) ([place]) 06/2014  ? Noted on CardioNet Monitor  - --> rhythm control with Tikosyn (Dr. Rayann Heman); converted from warfarin to apixaban for anticoagulation.  ? Pneumonia   ? Recurrent boils   ? Bilateral Groin  ? Rheumatoid arthritis ([place])   ? Per Patient Report; associated with OA  ? S/p cadaver renal transplant 1991  ? [place]  ? ? ? ?Assessment: ?66 y/o F with elevated troponin, on apixaban PTA for afib, holding apixaban and starting heparin, last dose of apixaban 4/24 at [facility]. Plans are for 48hrs of heparin therapy (anticipate stop 4/26) ?-hg= 9.7 (baseline ~ 10-11) ? ?PM update - aPTT resulted high (42>131) after previous rate increase from 1200 to 1400 units/hr. Level appears to have been drawn appropriately per RN but will repeat stat aPTT to confirm with large jump. No bleed issues reported. ? ?Goal of Therapy:  ?Heparin level 0.3-0.7 units/ml ?aPTT 66-102 seconds ?Monitor platelets by anticoagulation protocol: Yes ?  ?Plan:  ?Continue heparin at currently rate 1400 units/hr for now ?F/u stat repeat aPTT ?Monitor daily CBC, s/sx bleeding ? ? ?Arturo Morton, PharmD, BCPS ?Please check AMION for all [place] contact numbers ?Clinical Pharmacist ?12/09/2021 9:48 PM ?

## 2021-12-09 NOTE — Progress Notes (Signed)
ANTICOAGULATION CONSULT NOTE ? ?Pharmacy Consult for Heparin (Apixaban on hold) ?Indication: Elevated troponin, afib ? ?Allergies  ?Allergen Reactions  ? Tetracycline Hives  ?  Patient tolerated Doxycycline Dec 2020  ? Niacin Other (See Comments)  ?  Mouth blisters  ? Niaspan [Niacin Er] Other (See Comments)  ?  Mouth blisters  ? Sulfa Antibiotics Nausea Only and Other (See Comments)  ?  "Tears up stomach"  ? Sulfonamide Derivatives Other (See Comments)  ?  Reaction: per patient "tears her stomach up"  ? Codeine Nausea And Vomiting  ? Erythromycin Nausea And Vomiting  ? Hydromorphone Hcl Nausea And Vomiting  ? Morphine And Related Nausea And Vomiting  ? Nalbuphine Nausea And Vomiting  ?  Nubain  ? Sulfasalazine Nausea Only and Other (See Comments)  ?  per patient "tears her stomach up", "Tears up stomach"  ? Tape Rash and Other (See Comments)  ?  No "plastic" tape," please----cloth tape only  ? ? ?Patient Measurements: ?Height: '5\' 1"'$  (154.9 cm) ?Weight: 86.3 kg (190 lb 4.1 oz) ?IBW/kg (Calculated) : 47.8 ?Heparin dosing weight: 67.7 kg ? ?Vital Signs: ?Temp: 98.4 ?F (36.9 ?C) (04/25 1957) ?Temp Source: Oral (04/25 1957) ?BP: 121/75 (04/25 2226) ?Pulse Rate: 83 (04/25 2300) ? ?Labs: ?Recent Labs  ?  12/07/21 ?2125 12/08/21 ?0019 12/08/21 ?0025 12/08/21 ?0241 12/08/21 ?0434 12/08/21 ?1700 12/08/21 ?2115 12/08/21 ?2115 12/09/21 ?1749 12/09/21 ?1343 12/09/21 ?2111 12/09/21 ?2253  ?HGB 11.1*  --  12.6  --  11.3*  --   --   --  9.7*  --   --   --   ?HCT 36.3  --  37.0  --  36.7  --   --   --  31.5*  --   --   --   ?PLT 231  --   --   --  280  --   --   --  279  --   --   --   ?APTT  --   --   --   --   --   --  46*   < > 72* 42* 131* 131*  ?HEPARINUNFRC  --   --   --   --   --   --  >1.10*  --  >1.10* >1.10*  --   --   ?CREATININE 0.95  --   --  1.00  --   --   --   --  1.01*  --   --   --   ?TROPONINIHS 15 36*  --  969*  --  3,156*  --   --   --   --   --   --   ? < > = values in this interval not displayed.   ? ? ? ?Estimated Creatinine Clearance: 56.1 mL/min (A) (by C-G formula based on SCr of 1.01 mg/dL (H)). ? ? ?Medical History: ?Past Medical History:  ?Diagnosis Date  ? Anemia   ? Anxiety   ? Bilateral carotid artery stenosis   ? Carotid duplex 11/4965: 5-91% LICA, 63-84% RICA, >66% RECA, f/u 1 yr suggested  ? CAD (coronary artery disease) of bypass graft 5/01; 3/'02, 8/'03, 10/'04; 1/15  ? PCI x 5 to SVG-D1   ? CAD in native artery 07/1993  ? 3 Vessel Disease (LAD-D1 & RCA) -- CABG (Dx in setting of inferior STEMI-PTCA of RCA)  ? COPD mixed type ([place])   ? Followed by Dr. Lamonte Sakai "pulmonologist said no COPD"  ? Depression with  anxiety   ? Diabetes mellitus type 2 in obese [facility])   ? Diarrhea   ? started after cholecystectomy and mass removed from intestine  ? Dyslipidemia, goal LDL below 70   ? 08/2012: TC 137, TG 200, HDL 32!, LDL 45; on statin (followed by Dr.Deterding)  ? ESRD (end stage renal disease) ([place]) 1991  ? s/p Cadaveric Renal Transplant [facility] - Dr. Jimmy Footman)   ? Family history of adverse reaction to anesthesia   ? mom's bp dropped during/after anesthesia  ? Fibromyalgia   ? GERD (gastroesophageal reflux disease)   ? 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 07/1993  ? H/O: GI bleed   ? History of kidney stones   ? History of stroke 2012  ? "right eye stroke- half blind now"  ? 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 Witnessed syncopal event.  Had having having lots of nausea and vomiting with poor p.o. intake.  Thought to have QT prolongation with multiple medications involved and hypomagnesemia, hypokalemia.  Tikosyn discontinued along with Zoloft and Phenergan.  ? Hypertension associated with diabetes ([place])   ? Mild aortic stenosis by prior echocardiogram 07/2019  ? Echo:  Mild aortic stenosis (gradients: Mean 14.3 mmHg -peak 24.9 mmHg).  ? Morbid obesity ([place])   ? MRSA (methicillin resistant staph aureus) culture positive   ? OSA (obstructive sleep apnea)   ?  no longer on CPAP or home O2, states she doesn't need now after lap band  ? PAD (peripheral artery disease) ([place]) 08/2013  ? LEA Dopplers to be read by Dr. Fletcher Anon  ? PAF (paroxysmal atrial fibrillation) ([place]) 06/2014  ? Noted on CardioNet Monitor  - --> rhythm control with Tikosyn (Dr. Rayann Heman); converted from warfarin to apixaban for anticoagulation.  ? Pneumonia   ? Recurrent boils   ? Bilateral Groin  ? Rheumatoid arthritis ([place])   ? Per Patient Report; associated with OA  ? S/p cadaver renal transplant 1991  ? Katherine [name]  ? ? ? ?Assessment: ?66 y/o F with elevated troponin, on apixaban PTA for afib, holding apixaban and starting heparin, last dose of apixaban 4/24 at 0022.  ? ?4/25 PM update:  ?aPTT is elevated ? ?Goal of Therapy:  ?Heparin level 0.3-0.7 units/ml ?aPTT 66-102 seconds ?Monitor platelets by anticoagulation protocol: Yes ?  ?Plan:  ?Dec heparin back to 1200 units/hr ?8 hour aPTT and heparin level ? ?Narda Bonds, PharmD, BCPS ?Clinical Pharmacist ?Phone: [phone] ? ? ?

## 2021-12-09 NOTE — Progress Notes (Signed)
ANTICOAGULATION CONSULT NOTE ? ?Pharmacy Consult for Heparin (Apixaban on hold) ?Indication: Elevated troponin, afib ? ?Allergies  ?Allergen Reactions  ? Tetracycline Hives  ?  Patient tolerated Doxycycline Dec 2020  ? Niacin Other (See Comments)  ?  Mouth blisters  ? Niaspan [Niacin Er] Other (See Comments)  ?  Mouth blisters  ? Sulfa Antibiotics Nausea Only and Other (See Comments)  ?  "Tears up stomach"  ? Sulfonamide Derivatives Other (See Comments)  ?  Reaction: per patient "tears her stomach up"  ? Codeine Nausea And Vomiting  ? Erythromycin Nausea And Vomiting  ? Hydromorphone Hcl Nausea And Vomiting  ? Morphine And Related Nausea And Vomiting  ? Nalbuphine Nausea And Vomiting  ?  Nubain  ? Sulfasalazine Nausea Only and Other (See Comments)  ?  per patient "tears her stomach up", "Tears up stomach"  ? Tape Rash and Other (See Comments)  ?  No "plastic" tape," please----cloth tape only  ? ? ?Patient Measurements: ?Height: '5\' 1"'$  (154.9 cm) ?Weight: 86.3 kg (190 lb 4.1 oz) ?IBW/kg (Calculated) : 47.8 ?Heparin dosing weight: 67.7 kg ? ?Vital Signs: ?Temp: 97.6 ?F (36.4 ?C) (04/25 1324) ?Temp Source: Oral (04/25 0317) ?BP: 122/70 (04/25 4010) ?Pulse Rate: 82 (04/25 0628) ? ?Labs: ?Recent Labs  ?  12/07/21 ?2125 12/08/21 ?0019 12/08/21 ?0025 12/08/21 ?0241 12/08/21 ?0434 12/08/21 ?2725 12/08/21 ?2115 12/09/21 ?3664  ?HGB 11.1*  --  12.6  --  11.3*  --   --  9.7*  ?HCT 36.3  --  37.0  --  36.7  --   --  31.5*  ?PLT 231  --   --   --  280  --   --  279  ?APTT  --   --   --   --   --   --  46* 72*  ?HEPARINUNFRC  --   --   --   --   --   --  >1.10* >1.10*  ?CREATININE 0.95  --   --  1.00  --   --   --   --   ?TROPONINIHS 15 36*  --  969*  --  3,156*  --   --   ? ? ? ?Estimated Creatinine Clearance: 56.7 mL/min (by C-G formula based on SCr of 1 mg/dL). ? ? ?Medical History: ?Past Medical History:  ?Diagnosis Date  ? Anemia   ? Anxiety   ? Bilateral carotid artery stenosis   ? Carotid duplex 11/345: 4-25% LICA, 95-63%  RICA, >87% RECA, f/u 1 yr suggested  ? CAD (coronary artery disease) of bypass graft 5/01; 3/'02, 8/'03, 10/'04; 1/15  ? PCI x 5 to SVG-D1   ? CAD in native artery 07/1993  ? 3 Vessel Disease (LAD-D1 & RCA) -- CABG (Dx in setting of inferior STEMI-PTCA of RCA)  ? COPD mixed type ([place])   ? Followed by Dr. Lamonte Sakai "pulmonologist said no COPD"  ? Depression with anxiety   ? Diabetes mellitus type 2 in obese [facility])   ? Diarrhea   ? started after cholecystectomy and mass removed from intestine  ? Dyslipidemia, goal LDL below 70   ? 08/2012: TC 137, TG 200, HDL 32!, LDL 45; on statin (followed by Dr.Deterding)  ? ESRD (end stage renal disease) ([place]) 1991  ? s/p Cadaveric Renal Transplant [facility] - Dr. Jimmy Footman)   ? Family history of adverse reaction to anesthesia   ? mom's bp dropped during/after anesthesia  ? Fibromyalgia   ? GERD (gastroesophageal reflux  disease)   ? 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 07/1993  ? H/O: GI bleed   ? History of kidney stones   ? History of stroke 2012  ? "right eye stroke- half blind now"  ? 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 Witnessed syncopal event.  Had having having lots of nausea and vomiting with poor p.o. intake.  Thought to have QT prolongation with multiple medications involved and hypomagnesemia, hypokalemia.  Tikosyn discontinued along with Zoloft and Phenergan.  ? Hypertension associated with diabetes ([place])   ? Mild aortic stenosis by prior echocardiogram 07/2019  ? Echo:  Mild aortic stenosis (gradients: Mean 14.3 mmHg -peak 24.9 mmHg).  ? Morbid obesity ([place])   ? MRSA (methicillin resistant staph aureus) culture positive   ? OSA (obstructive sleep apnea)   ? no longer on CPAP or home O2, states she doesn't need now after lap band  ? PAD (peripheral artery disease) ([place]) 08/2013  ? LEA Dopplers to be read by Dr. Fletcher Anon  ? PAF (paroxysmal atrial fibrillation) ([place]) 06/2014  ? Noted on CardioNet Monitor  - --> rhythm control with  Tikosyn (Dr. Rayann Heman); converted from warfarin to apixaban for anticoagulation.  ? Pneumonia   ? Recurrent boils   ? Bilateral Groin  ? Rheumatoid arthritis ([employer])   ? Per Patient Report; associated with OA  ? S/p cadaver renal transplant 1991  ? [place]  ? ? ? ?Assessment: ?66 y/o F with elevated troponin, on apixaban PTA for afib, holding apixaban and starting heparin, last dose of apixaban 4/24 at 0022.  ? ?4/25 AM update:  ?aPTT is therapeutic  ? ?Goal of Therapy:  ?Heparin level 0.3-0.7 units/ml ?aPTT 66-102 seconds ?Monitor platelets by anticoagulation protocol: Yes ?  ?Plan:  ?Cont heparin at 1200 units/hr ?1400 aPTT and heparin level ? ?Narda Bonds, PharmD, BCPS ?Clinical Pharmacist ?Phone: [phone] ? ? ?

## 2021-12-09 NOTE — Progress Notes (Signed)
?PROGRESS NOTE ? ? ? ?Katherine [name]  HKV:[id number] DOB: [birth year] DOA: 12/07/2021 ?PCP: [name], MD  ? ? ?Chief Complaint  ?Patient presents with  ? Respiratory Distress  ? ? ?Brief Narrative:  ? ? Katherine [name] is a 66 y.o. female with medical history significant of chronic respiratory failure, COPD, asthma, CAD status post stenting, CABG and in grafts stenting, PAD, carotid artery disease, hyperlipidemia, OSA, obesity status post lap band, paroxysmal A-fib, polymorphic [name], CHF, GERD, ESRD status post renal transplant, hypertension, anemia, depression, anxiety, CVA presenting with worsening shortness of breath. ?Patient reports 2 days of progressive shortness of breath.  She is on 5 L chronically at home and has had to increase fluids over the past 2 days and has remained short of breath.  She was requiring up to 10 L oxygen upon admission, as well her work-up was noted for significant trend up and troponins with concern of  NSTEMI's, so she was admitted for further work up. ? ? ?Assessment & Plan: ?  ?Principal Problem: ?  Acute on chronic respiratory failure with hypoxia ([place]) ?Active Problems: ?  CAD S/P percutaneous coronary angioplasty - PCI x 5 to SVG-D1 ?  Extrinsic asthma ?  GERD ?  Morbid obesity - s/p Lap Band 9/'05 ?  Hx of CABG ?  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  Dyslipidemia, goal LDL below 70 ?  Essential hypertension ?  PAD (peripheral artery disease) ([place]) ?  Paroxysmal atrial fibrillation ([place]); CHA2DS2VASc score F, HTN, CAD, CVA = 5 ?  Type 2 diabetes mellitus with hyperlipidemia ([place]) ?  ESRD (end stage renal disease) ([place]) ?  OSA (obstructive sleep apnea) ?  Anxiety and depression ?  Chronic respiratory failure with hypoxia (HCC) ?  COPD (chronic obstructive pulmonary disease) ([place]) ?  History of renal transplant ?  Chronic diastolic CHF (congestive heart failure) ([place]) ?  CHF exacerbation ([place]) ?  COPD with acute exacerbation ([place]) ? ? ?Acute on  chronic respiratory failure with hypoxia ?COPD/Asthma exacerbation ?CHF exacerbation ?- Patient presenting with respiratory failure with hypoxia and increased oxygen demand at home. ?- Baseline oxygen requirement with chronic respiratory failure on 5 L, has had increases as high as 10 L at ED ?-Resolved, oxygen requirement back to baseline at 5 L ? ?NSTEMI  ?-Troponins trending up, 15>36> 969> 3156 ?-Management per cardiology, patient is not a candidate for further catheterization due to severe vascular disease with no access, plan for medical management, continue with heparin drip for total of 48 hours until tomorrow, increased ranolazine from 500 to 1000 mg twice daily, continue with isosorbide and metoprolol. ?  ? ?Acute COPD/asthma exacerbation ?-Wheezing has improved, will transition from IV for Medrol to p.o. prednisone.(she is on prednisone 5 mg oral daily given history of renal transplant). ? ?Acute on chronic combined diastolic /systolic CHF ?-BNP significantly elevated at 1300 ?- Continue with IV Lasix twice daily ?- Strict I's and O's and daily weights ?- Continue home metoprolol ?-Management per cardiology ? ? NSTEMI  ?-Troponins trending up, 15>36> 969> 3156 ?-Management per cardiology, patient is not a candidate for further catheterization due to severe vascular disease with no access, plan for medical management, continue with heparin drip for total of 48 hours until tomorrow, increased ranolazine from 500 to 1000 mg twice daily, continue with isosorbide and metoprolol. ?  ?CAD ?PAD ?Carotid artery disease ?Hyperlipidemia ?-  Significant CAD history with multiple stents,  status post CABG, history of in  graft stenosis and stenting. ?-Please see above discussion ?- Continue home Ranexa, metoprolol, Imdur ?- Continue home rosuvastatin ?  ?Hypertension ?- Continue home metoprolol ?- Receiving IV Lasix as above ?  ?Paroxysmal A-fib ?- Continue home metoprolol, Eliquis ?  ?Chronic pain ?- Has received a  few doses of fentanyl in the ED ?- Continue home as needed oxycodone ? ?GERD ?- Continue home PPI ? ?History of ESRD now status post renal transplant in 1991 ?- Continue home Imuran ?- Currently on IV solumedrol  (baseline dose 5 mg daily) ?  ?Anemia ?- Hemoglobin stable 11.1 in the ED. ?- Trend CBC ? ?Anxiety ?Depression ?- Continue home Zyprexa, Zoloft, Lamictal ?- Continue home Ativan as needed ?  ?History of CVA ?- Continue home Plavix, rosuvastatin ?  ?Morbid obesity status post lap band ?- Noted ? ?Right heel pressure ulcer: ?- wound care consulted ? ?DVT prophylaxis: Eliquis >> heparin GTT ?Code Status: DNR ?Family Communication: none at bedside ?Disposition:  ? ?Status is: Observation ?The patient will require care spanning > 2 midnights and should be moved to inpatient because: Respiratory failure, on IV steroids, working diagnosis of NSTEMI, on heparin  ?  ?Consultants:  ? Cardiology ? ?Subjective: ? ?Had an episode of chest pain overnight, she reports her dyspnea at baseline ? ?Objective: ?Vitals:  ? 12/09/21 0628 12/09/21 0740 12/09/21 0801 12/09/21 1128  ?BP: 122/70 106/80  106/68  ?Pulse: 82 (!) 104  84  ?Resp:  20  17  ?Temp:  97.7 ?F (36.5 ?C)  97.8 ?F (36.6 ?C)  ?TempSrc:  Oral  Oral  ?SpO2:  96% 97% 94%  ?Weight:      ?Height:      ? ? ?Intake/Output Summary (Last 24 hours) at 12/09/2021 1346 ?Last data filed at 12/09/2021 8341 ?Gross per 24 hour  ?Intake 747.93 ml  ?Output 1100 ml  ?Net -352.07 ml  ? ? ?Filed Weights  ? 12/07/21 2104 12/08/21 1708 12/09/21 0317  ?Weight: 81.6 kg 85.9 kg 86.3 kg  ? ? ?Examination: ? ?Awake Alert, Oriented X 3, frail, chronically ill-appearing. ?Symmetrical Chest wall movement, Good air movement bilaterally, CTAB ?RRR,No Gallops,Rubs or new Murmurs, No Parasternal Heave ?+ve B.Sounds, Abd Soft, No tenderness, No rebound - guarding or rigidity. ?No Cyanosis, Clubbing or edema, No new Rash or bruise   ? ? ? ? ?Data Reviewed: I have personally reviewed following labs and  imaging studies ? ?CBC: ?Recent Labs  ?Lab 12/07/21 ?2125 12/08/21 ?0025 12/08/21 ?0434 12/09/21 ?9622  ?WBC 9.1  --  7.1 10.6*  ?NEUTROABS 7.8*  --   --   --   ?HGB 11.1* 12.6 11.3* 9.7*  ?HCT 36.3 37.0 36.7 31.5*  ?MCV 89.4  --  88.0 87.5  ?PLT 231  --  280 279  ? ? ? ?Basic Metabolic Panel: ?Recent Labs  ?Lab 12/07/21 ?2125 12/08/21 ?0019 12/08/21 ?0025 12/08/21 ?0241 12/09/21 ?2979  ?NA 132*  --  133* 132* 132*  ?K 4.9  --  4.7 4.8 3.4*  ?CL 100  --   --  98 94*  ?CO2 22  --   --  23 28  ?GLUCOSE 331*  --   --  407* 358*  ?BUN 10  --   --  11 18  ?CREATININE 0.95  --   --  1.00 1.01*  ?CALCIUM 9.1  --   --  9.0 8.8*  ?MG  --  2.2  --   --   --   ? ? ? ?  GFR: ?Estimated Creatinine Clearance: 56.1 mL/min (A) (by C-G formula based on SCr of 1.01 mg/dL (H)). ? ?Liver Function Tests: ?Recent Labs  ?Lab 12/08/21 ?0241  ?AST 28  ?ALT 9  ?ALKPHOS 119  ?BILITOT 0.8  ?PROT 7.2  ?ALBUMIN 2.6*  ? ? ? ?CBG: ?Recent Labs  ?Lab 12/08/21 ?6203 12/08/21 ?1205 12/08/21 ?1718 12/08/21 ?2103 12/09/21 ?5597  ?GLUCAP 396* 350* 327* 321* 353*  ? ? ? ?Recent Results (from the past 240 hour(s))  ?Respiratory (~20 pathogens) panel by PCR     Status: None  ? Collection Time: 12/07/21 11:17 PM  ? Specimen: Nasopharyngeal Swab; Respiratory  ?Result Value Ref Range Status  ? Adenovirus NOT DETECTED NOT DETECTED Final  ? Coronavirus 229E NOT DETECTED NOT DETECTED Final  ?  Comment: (NOTE) ?The Coronavirus on the Respiratory Panel, DOES NOT test for the novel  ?Coronavirus (2019 nCoV) ?  ? Coronavirus HKU1 NOT DETECTED NOT DETECTED Final  ? Coronavirus NL63 NOT DETECTED NOT DETECTED Final  ? Coronavirus OC43 NOT DETECTED NOT DETECTED Final  ? Metapneumovirus NOT DETECTED NOT DETECTED Final  ? Rhinovirus / Enterovirus NOT DETECTED NOT DETECTED Final  ? Influenza A NOT DETECTED NOT DETECTED Final  ? Influenza B NOT DETECTED NOT DETECTED Final  ? Parainfluenza Virus 1 NOT DETECTED NOT DETECTED Final  ? Parainfluenza Virus 2 NOT DETECTED NOT  DETECTED Final  ? Parainfluenza Virus 3 NOT DETECTED NOT DETECTED Final  ? Parainfluenza Virus 4 NOT DETECTED NOT DETECTED Final  ? Respiratory Syncytial Virus NOT DETECTED NOT DETECTED Final  ? Bordetella pertu

## 2021-12-09 NOTE — Progress Notes (Signed)
Inpatient Diabetes Program Recommendations ? ?AACE/ADA: New Consensus Statement on Inpatient Glycemic Control (2015) ? ?Target Ranges:  Prepandial:   less than 140 mg/dL ?     Peak postprandial:   less than 180 mg/dL (1-2 hours) ?     Critically ill patients:  140 - 180 mg/dL  ? ?Lab Results  ?Component Value Date  ? GLUCAP 353 (H) 12/09/2021  ? HGBA1C 7.8 (H) 10/20/2021  ? ? ?Review of Glycemic Control ? Latest Reference Range & Units 12/08/21 08:34 12/08/21 12:05 12/08/21 17:18 12/08/21 21:03 12/09/21 06:09  ?Glucose-Capillary 70 - 99 mg/dL 396 (H) 350 (H) 327 (H) 321 (H) 353 (H)  ?(H): Data is abnormally high ? ?History: DM2, COPD, ESRD/ Renal Transplant ?  ?Home DM Meds: Basaglar 20 units daily ?                            Novolog 4-12 units TID per SSI ?                            Amaryl 4 mg daily ?  ?Current Orders:Semglee 15 units q hs ? Novolog 0-9 units tid , hs 0-5 units ?     Solumedrol 80 mg qd ?  ?Inpatient Diabetes Program Recommendations:   ?-Increase Semglee to 20 units daily (home basal dose) ?-Add Novolog 4 units tid meal coverage if eats 50% meal ? ?Thank you, ?Nani Gasser Adamae Ricklefs, RN, MSN, CDE  ?Diabetes Coordinator ?Inpatient Glycemic Control Team ?Team Pager [phone] (8am-5pm) ?12/09/2021 10:12 AM ? ? ? ? ?

## 2021-12-09 NOTE — Progress Notes (Signed)
TRH night cross cover note: ? ?I was notified by RN of the patient's request for resumption of her home prn sublingual nitroglycerin.  Patient reports a history of chronic chest pain for which she is on Ranexa as well as prn sublingual nitroglycerin as an outpatient.  Is currently experiencing some chest discomfort, which she states is consistent with that of her chronic chest pain, noting no increase in intensity or change in quality or location relative to that at baseline.  Vital signs reportedly stable.  ? ?I ordered stat EKG, which, in comparison to most recent prior EKG from 6:25 AM on 12/08/2021, showed sinus tachycardia with left bundle branch block, heart rate 109, unchanged T wave inversion in leads I and aVL, and no evidence of interval ST changes.  ? ?I confirmed that the patient is already receiving her outpatient Ranexa, and I subsequently reordered her home as needed sublingual nitroglycerin, as requested. ? ? ?Babs Bertin, DO ?Hospitalist ? ?

## 2021-12-09 NOTE — Progress Notes (Addendum)
ANTICOAGULATION CONSULT NOTE ? ?Pharmacy Consult for Heparin (Apixaban on hold) ?Indication: Elevated troponin, afib ? ?Allergies  ?Allergen Reactions  ? Tetracycline Hives  ?  Patient tolerated Doxycycline Dec 2020  ? Niacin Other (See Comments)  ?  Mouth blisters  ? Niaspan [Niacin Er] Other (See Comments)  ?  Mouth blisters  ? Sulfa Antibiotics Nausea Only and Other (See Comments)  ?  "Tears up stomach"  ? Sulfonamide Derivatives Other (See Comments)  ?  Reaction: per patient "tears her stomach up"  ? Codeine Nausea And Vomiting  ? Erythromycin Nausea And Vomiting  ? Hydromorphone Hcl Nausea And Vomiting  ? Morphine And Related Nausea And Vomiting  ? Nalbuphine Nausea And Vomiting  ?  Nubain  ? Sulfasalazine Nausea Only and Other (See Comments)  ?  per patient "tears her stomach up", "Tears up stomach"  ? Tape Rash and Other (See Comments)  ?  No "plastic" tape," please----cloth tape only  ? ? ?Patient Measurements: ?Height: '5\' 1"'$  (154.9 cm) ?Weight: 86.3 kg (190 lb 4.1 oz) ?IBW/kg (Calculated) : 47.8 ?Heparin dosing weight: 67.7 kg ? ?Vital Signs: ?Temp: 97.8 ?F (36.6 ?C) (04/25 1128) ?Temp Source: Oral (04/25 1128) ?BP: 106/68 (04/25 1128) ?Pulse Rate: 84 (04/25 1128) ? ?Labs: ?Recent Labs  ?  12/07/21 ?2125 12/08/21 ?0019 12/08/21 ?0025 12/08/21 ?0241 12/08/21 ?0434 12/08/21 ?2831 12/08/21 ?2115 12/09/21 ?5176 12/09/21 ?1343  ?HGB 11.1*  --  12.6  --  11.3*  --   --  9.7*  --   ?HCT 36.3  --  37.0  --  36.7  --   --  31.5*  --   ?PLT 231  --   --   --  280  --   --  279  --   ?APTT  --   --   --   --   --   --  46* 72* 42*  ?HEPARINUNFRC  --   --   --   --   --   --  >1.10* >1.10* >1.10*  ?CREATININE 0.95  --   --  1.00  --   --   --  1.01*  --   ?TROPONINIHS 15 36*  --  969*  --  3,156*  --   --   --   ? ? ? ?Estimated Creatinine Clearance: 56.1 mL/min (A) (by C-G formula based on SCr of 1.01 mg/dL (H)). ? ? ?Medical History: ?Past Medical History:  ?Diagnosis Date  ? Anemia   ? Anxiety   ? Bilateral carotid  artery stenosis   ? Carotid duplex 08/6071: 7-10% LICA, 62-69% RICA, >48% RECA, f/u 1 yr suggested  ? CAD (coronary artery disease) of bypass graft 5/01; 3/'02, 8/'03, 10/'04; 1/15  ? PCI x 5 to SVG-D1   ? CAD in native artery 07/1993  ? 3 Vessel Disease (LAD-D1 & RCA) -- CABG (Dx in setting of inferior STEMI-PTCA of RCA)  ? COPD mixed type ([place])   ? Followed by Dr. Lamonte Sakai "pulmonologist said no COPD"  ? Depression with anxiety   ? Diabetes mellitus type 2 in obese [facility])   ? Diarrhea   ? started after cholecystectomy and mass removed from intestine  ? Dyslipidemia, goal LDL below 70   ? 08/2012: TC 137, TG 200, HDL 32!, LDL 45; on statin (followed by Dr.Deterding)  ? ESRD (end stage renal disease) ([place]) 1991  ? s/p Cadaveric Renal Transplant [facility] - Dr. Jimmy Footman)   ? Family history of adverse reaction  to anesthesia   ? mom's bp dropped during/after anesthesia  ? Fibromyalgia   ? GERD (gastroesophageal reflux disease)   ? 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 07/1993  ? H/O: GI bleed   ? History of kidney stones   ? History of stroke 2012  ? "right eye stroke- half blind now"  ? 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 Witnessed syncopal event.  Had having having lots of nausea and vomiting with poor p.o. intake.  Thought to have QT prolongation with multiple medications involved and hypomagnesemia, hypokalemia.  Tikosyn discontinued along with Zoloft and Phenergan.  ? Hypertension associated with diabetes ([place])   ? Mild aortic stenosis by prior echocardiogram 07/2019  ? Echo:  Mild aortic stenosis (gradients: Mean 14.3 mmHg -peak 24.9 mmHg).  ? Morbid obesity ([place])   ? MRSA (methicillin resistant staph aureus) culture positive   ? OSA (obstructive sleep apnea)   ? no longer on CPAP or home O2, states she doesn't need now after lap band  ? PAD (peripheral artery disease) ([place]) 08/2013  ? LEA Dopplers to be read by Dr. Fletcher Anon  ? PAF (paroxysmal atrial fibrillation) ([place])  06/2014  ? Noted on CardioNet Monitor  - --> rhythm control with Tikosyn (Dr. Rayann Heman); converted from warfarin to apixaban for anticoagulation.  ? Pneumonia   ? Recurrent boils   ? Bilateral Groin  ? Rheumatoid arthritis ([place])   ? Per Patient Report; associated with OA  ? S/p cadaver renal transplant 1991  ? [place]  ? ? ? ?Assessment: ?66 y/o F with elevated troponin, on apixaban PTA for afib, holding apixaban and starting heparin, last dose of apixaban 4/24 at [facility]. Plans are for 48hrs of heparin therapy (anticipate stop 4/26) ?-aPTT down to 42 ?-hg= 9.7 (baseline ~ 10-11) ? ?Goal of Therapy:  ?Heparin level 0.3-0.7 units/ml ?aPTT 66-102 seconds ?Monitor platelets by anticoagulation protocol: Yes ?  ?Plan:  ?-Increase heparin to 1400 units/hr ?-Heparin level in 6 hours and daily wth CBC daily ? ?Hildred Laser, PharmD ?Clinical Pharmacist ?**Pharmacist phone directory can now be found on [email or web address] ([email or web address]).  Listed under [place]. ? ? ? ? ? ? ? ?

## 2021-12-09 NOTE — Progress Notes (Signed)
Heart Failure Navigator Progress Note  Following this hospitalization to assess for HV TOC readiness.   ECHO pending?  Eowyn Tabone, BSN, RN Heart Failure Nurse Navigator Secure Chat Only  

## 2021-12-10 ENCOUNTER — Other Ambulatory Visit (HOSPITAL_COMMUNITY): Payer: Self-pay

## 2021-12-10 ENCOUNTER — Encounter (HOSPITAL_COMMUNITY): Payer: Self-pay | Admitting: Internal Medicine

## 2021-12-10 ENCOUNTER — Ambulatory Visit: Payer: Medicare Other | Admitting: Internal Medicine

## 2021-12-10 DIAGNOSIS — F419 Anxiety disorder, unspecified: Secondary | ICD-10-CM

## 2021-12-10 DIAGNOSIS — I10 Essential (primary) hypertension: Secondary | ICD-10-CM

## 2021-12-10 DIAGNOSIS — N186 End stage renal disease: Secondary | ICD-10-CM

## 2021-12-10 DIAGNOSIS — I4819 Other persistent atrial fibrillation: Secondary | ICD-10-CM

## 2021-12-10 DIAGNOSIS — I5043 Acute on chronic combined systolic (congestive) and diastolic (congestive) heart failure: Secondary | ICD-10-CM | POA: Diagnosis not present

## 2021-12-10 DIAGNOSIS — I2510 Atherosclerotic heart disease of native coronary artery without angina pectoris: Secondary | ICD-10-CM | POA: Diagnosis not present

## 2021-12-10 DIAGNOSIS — F32A Depression, unspecified: Secondary | ICD-10-CM

## 2021-12-10 DIAGNOSIS — J9621 Acute and chronic respiratory failure with hypoxia: Secondary | ICD-10-CM | POA: Diagnosis not present

## 2021-12-10 DIAGNOSIS — E785 Hyperlipidemia, unspecified: Secondary | ICD-10-CM

## 2021-12-10 DIAGNOSIS — G4733 Obstructive sleep apnea (adult) (pediatric): Secondary | ICD-10-CM

## 2021-12-10 LAB — BASIC METABOLIC PANEL
Anion gap: 9 (ref 5–15)
BUN: 17 mg/dL (ref 8–23)
CO2: 29 mmol/L (ref 22–32)
Calcium: 9.1 mg/dL (ref 8.9–10.3)
Chloride: 94 mmol/L — ABNORMAL LOW (ref 98–111)
Creatinine, Ser: 1.12 mg/dL — ABNORMAL HIGH (ref 0.44–1.00)
GFR, Estimated: 55 mL/min — ABNORMAL LOW (ref >60)
Glucose, Bld: 391 mg/dL — ABNORMAL HIGH (ref 70–99)
Potassium: 3.6 mmol/L (ref 3.5–5.1)
Sodium: 132 mmol/L — ABNORMAL LOW (ref 135–145)

## 2021-12-10 LAB — GLUCOSE, CAPILLARY
Glucose-Capillary: 348 mg/dL — ABNORMAL HIGH (ref 70–99)
Glucose-Capillary: 397 mg/dL — ABNORMAL HIGH (ref 70–99)
Glucose-Capillary: 385 mg/dL — ABNORMAL HIGH (ref 70–99)
Glucose-Capillary: 255 mg/dL — ABNORMAL HIGH (ref 70–99)
Glucose-Capillary: 448 mg/dL — ABNORMAL HIGH (ref 70–99)
Glucose-Capillary: 483 mg/dL — ABNORMAL HIGH (ref 70–99)
Glucose-Capillary: 341 mg/dL — ABNORMAL HIGH (ref 70–99)

## 2021-12-10 LAB — CBC
HCT: 30.8 % — ABNORMAL LOW (ref 36.0–46.0)
Hemoglobin: 9.6 g/dL — ABNORMAL LOW (ref 12.0–15.0)
MCH: 27 pg (ref 26.0–34.0)
MCHC: 31.2 g/dL (ref 30.0–36.0)
MCV: 86.8 fL (ref 80.0–100.0)
Platelets: 273 10*3/uL (ref 150–400)
RBC: 3.55 MIL/uL — ABNORMAL LOW (ref 3.87–5.11)
RDW: 19.2 % — ABNORMAL HIGH (ref 11.5–15.5)
WBC: 10 10*3/uL (ref 4.0–10.5)
nRBC: 0 % (ref 0.0–0.2)

## 2021-12-10 LAB — APTT: aPTT: 107 seconds — ABNORMAL HIGH (ref 24–36)

## 2021-12-10 LAB — HEPARIN LEVEL (UNFRACTIONATED): Heparin Unfractionated: >1.1 IU/mL — ABNORMAL HIGH (ref 0.30–0.70)

## 2021-12-10 MED ORDER — FUROSEMIDE 40 MG PO TABS
40.0000 mg | ORAL_TABLET | Freq: Every day | ORAL | Status: DC
  Administered 2021-12-11 – 2021-12-12 (×2): 40 mg via ORAL
  Filled 2021-12-10 (×2): qty 1

## 2021-12-10 MED ORDER — NYSTATIN 100000 UNIT/ML MT SUSP
5.0000 mL | Freq: Four times a day (QID) | OROMUCOSAL | Status: DC
  Administered 2021-12-10 – 2021-12-11 (×3): 500000 [IU] via ORAL
  Filled 2021-12-10 (×3): qty 5

## 2021-12-10 MED ORDER — INSULIN ASPART 100 UNIT/ML IJ SOLN
0.0000 [IU] | Freq: Every day | INTRAMUSCULAR | Status: DC
  Administered 2021-12-10: 4 [IU] via SUBCUTANEOUS

## 2021-12-10 MED ORDER — INSULIN ASPART 100 UNIT/ML IJ SOLN
0.0000 [IU] | Freq: Three times a day (TID) | INTRAMUSCULAR | Status: DC
  Administered 2021-12-11: 15 [IU] via SUBCUTANEOUS
  Administered 2021-12-11: 7 [IU] via SUBCUTANEOUS
  Administered 2021-12-11: 20 [IU] via SUBCUTANEOUS
  Administered 2021-12-12 (×2): 11 [IU] via SUBCUTANEOUS
  Administered 2021-12-12: 15 [IU] via SUBCUTANEOUS

## 2021-12-10 MED ORDER — INSULIN ASPART 100 UNIT/ML IJ SOLN: 10.0000 [IU] | Freq: Once | INTRAMUSCULAR | Status: DC

## 2021-12-10 MED ORDER — PREDNISONE 10 MG PO TABS
5.0000 mg | ORAL_TABLET | Freq: Every day | ORAL | Status: DC
Start: 2021-12-11 — End: 2021-12-13
  Administered 2021-12-11 – 2021-12-12 (×2): 5 mg via ORAL
  Filled 2021-12-10 (×2): qty 1

## 2021-12-10 MED ORDER — APIXABAN 5 MG PO TABS
5.0000 mg | ORAL_TABLET | Freq: Two times a day (BID) | ORAL | Status: DC
Start: 2021-12-10 — End: 2021-12-13
  Administered 2021-12-10 – 2021-12-12 (×6): 5 mg via ORAL
  Filled 2021-12-10 (×6): qty 1

## 2021-12-10 MED ORDER — INSULIN ASPART 100 UNIT/ML IJ SOLN
15.0000 [IU] | Freq: Once | INTRAMUSCULAR | Status: AC
  Administered 2021-12-10: 15 [IU] via SUBCUTANEOUS

## 2021-12-10 MED ORDER — INSULIN ASPART 100 UNIT/ML IJ SOLN
0.0000 [IU] | Freq: Three times a day (TID) | INTRAMUSCULAR | Status: DC
  Administered 2021-12-10: 20 [IU] via SUBCUTANEOUS

## 2021-12-10 NOTE — Progress Notes (Signed)
Patient CBG at 1137 was 448, Notified MD Nettey at 1150. Orders to give 15 units of Novolog and to adjust the sliding scale. Will continue to monitor, PT is awake and eating at this time.  ?

## 2021-12-10 NOTE — Progress Notes (Addendum)
CBG resulted at 483 at 1626, MD Nettey aware at 1635. Orders to give 20 units of Novolog and to hold lasix for now. Will continue to monitor.  ?Patient states not feeling right, vital signs and assessment are WDL and MD aware.  ?

## 2021-12-10 NOTE — TOC Progression Note (Signed)
Transition of Care (TOC) - Progression Note  ? ? ?Patient Details  ?Name: Katherine [name] ?MRN: [id number] ?Date of Birth: 16-Dec-1955 ? ?Transition of Care (TOC) CM/SW Contact  ?Angelita Ingles, RN ?Phone [phone] ? ?12/10/2021, 4:12 PM ? ?Clinical Narrative:    ? ?Transition of Care (TOC) Screening Note ? ? ?Patient Details  ?Name: Katherine [name] ?Date of Birth: 1956/08/05 ? ? ?Transition of Care (TOC) CM/SW Contact:    ?Angelita Ingles, RN ?Phone Number: ?12/10/2021, 4:13 PM ? ? ? ?Transition of Care Department [facility]) has reviewed patient and no TOC needs have been identified at this time. We will continue to monitor patient advancement through interdisciplinary progression rounds. If new patient transition needs arise, please place a TOC consult. ? ? ? ? ?  ?  ? ?Expected Discharge Plan and Services ?  ?  ?  ?  ?  ?                ?  ?  ?  ?  ?  ?  ?  ?  ?  ?  ? ? ?Social Determinants of Health (SDOH) Interventions ?Food Insecurity Interventions: Intervention Not Indicated ?Housing Interventions: Intervention Not Indicated ?Transportation Interventions: Intervention Not Indicated ? ?Readmission Risk Interventions ? ?  09/23/2021  ?  4:39 PM 09/08/2021  ?  4:00 PM 05/14/2021  ?  1:12 PM  ?Readmission Risk Prevention Plan  ?Transportation Screening Complete Complete Complete  ?Medication Review Press photographer) Complete Complete Complete  ?PCP or Specialist appointment within 3-5 days of discharge Complete Complete Complete  ?[place] or Home Care Consult Complete Complete Complete  ?SW Recovery Care/Counseling Consult Complete Complete Complete  ?Palliative Care Screening Complete Not Applicable Not Applicable  ?[place] Not Applicable Not Applicable Not Applicable  ? ? ?

## 2021-12-10 NOTE — Progress Notes (Signed)
CBG resulted at 341 at 2110. Patient had no night coverage insulin order. MD Bridgett Larsson paged. New orders received for night coverage and will be implemented as ordered.  ?

## 2021-12-10 NOTE — Progress Notes (Signed)
Heart Failure Nurse Navigator Progress Note ? ?PCP: [name], MD ?PCP-Cardiologist: Ellyn Hack ?Admission Diagnosis: Chest pain, Shortness of breath ?Admitted from: Home ? ?Presentation:   ?Katherine [name] presented with respiratory distress, chest pain from home via EMS. Shortness of breath for the last 2 days, with increased oxygen demand. Patient complained of swelling in her feet and fullness in her abdomen. BNP 1,347, Glucose 331. Patient given IV lasix, on 5 L oxygen, Repeat Echo shows EF 45-50%, last Echo 09/21/21 was 55-60%. Patient has had a 30 year smoking history, PCI x 5. ? ?Education was done with patient on the importance of daily weights, ( gave her a scale for home use) when to call the doctor, or go to the ER. Her diet/fluid restrictions, she spoke about her drinking multiple bottles of Diet coke a day, her brother does the cooking and doesn't use salt. Spoke about the importance of taking ALL her medications as prescribed and keeping her appointments with her doctors. She stated she sets up her transportation thru her Medicare transportation benefits. Patient was excited t come to the HF [facility] clinic, because she doesn't want to keep coming into the hospital. Her appointment is scheduled for 12/23/2021 @ 2 pm.  ? ?ECHO/ LVEF: 45-50% ? ?Clinical Course: ? ?Past Medical History:  ?Diagnosis Date  ? Anemia   ? Anxiety   ? Bilateral carotid artery stenosis   ? Carotid duplex 03/4131: 4-40% LICA, 10-27% RICA, >25% RECA, f/u 1 yr suggested  ? CAD (coronary artery disease) of bypass graft 5/01; 3/'02, 8/'03, 10/'04; 1/15  ? PCI x 5 to SVG-D1   ? CAD in native artery 07/1993  ? 3 Vessel Disease (LAD-D1 & RCA) -- CABG (Dx in setting of inferior STEMI-PTCA of RCA)  ? COPD mixed type ([place])   ? Followed by Dr. Lamonte Sakai "pulmonologist said no COPD"  ? Depression with anxiety   ? Diabetes mellitus type 2 in obese [facility])   ? Diarrhea   ? started after cholecystectomy and mass removed from intestine  ? Dyslipidemia,  goal LDL below 70   ? 08/2012: TC 137, TG 200, HDL 32!, LDL 45; on statin (followed by Dr.Deterding)  ? ESRD (end stage renal disease) ([place]) 1991  ? s/p Cadaveric Renal Transplant [facility] - Dr. Jimmy Footman)   ? Family history of adverse reaction to anesthesia   ? mom's bp dropped during/after anesthesia  ? Fibromyalgia   ? GERD (gastroesophageal reflux disease)   ? 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 07/1993  ? H/O: GI bleed   ? History of kidney stones   ? History of stroke 2012  ? "right eye stroke- half blind now"  ? 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 Witnessed syncopal event.  Had having having lots of nausea and vomiting with poor p.o. intake.  Thought to have QT prolongation with multiple medications involved and hypomagnesemia, hypokalemia.  Tikosyn discontinued along with Zoloft and Phenergan.  ? Hypertension associated with diabetes ([place])   ? Mild aortic stenosis by prior echocardiogram 07/2019  ? Echo:  Mild aortic stenosis (gradients: Mean 14.3 mmHg -peak 24.9 mmHg).  ? Morbid obesity ([place])   ? MRSA (methicillin resistant staph aureus) culture positive   ? OSA (obstructive sleep apnea)   ? no longer on CPAP or home O2, states she doesn't need now after lap band  ? PAD (peripheral artery disease) ([place]) 08/2013  ? LEA Dopplers to be read by Dr.  Arida  ? PAF (paroxysmal atrial fibrillation) ([place]) 06/2014  ? Noted on CardioNet Monitor  - --> rhythm control with Tikosyn (Dr. Rayann Heman); converted from warfarin to apixaban for anticoagulation.  ? Pneumonia   ? Recurrent boils   ? Bilateral Groin  ? Rheumatoid arthritis ([place])   ? Per Patient Report; associated with OA  ? S/p cadaver renal transplant 1991  ? [place]  ?  ? ?Social History  ? ?Socioeconomic History  ? Marital status: Widowed  ?  Spouse name: Not on file  ? Number of children: 0  ? Years of education: Not on file  ? Highest education level: High school graduate  ?Occupational History  ? Occupation:  Disability  ?  Comment: since 1989  ?Tobacco Use  ? Smoking status: Every Day  ?  Packs/day: 1.00  ?  Years: 30.00  ?  Pack years: 30.00  ?  Types: Cigarettes  ?  Last attempt to quit: 08/17/2002  ?  Years since quitting: 19.3  ? Smokeless tobacco: Never  ?Vaping Use  ? Vaping Use: Never used  ?Substance and Sexual Activity  ? Alcohol use: No  ? Drug use: No  ? Sexual activity: Not on file  ?Other Topics Concern  ? Not on file  ?Social History Narrative  ? ** Merged History Encounter ** She is currently married, and the caregiver of her husband who is recovering from surgery for tongue cancer now diagnosed with lung cancer. Prior to his diagnosis of her husband, she actually had adopted a 98-year-old child who she knows caring for as well. With all the surgeries, they have been quite financially troubled. Thanks the help of her community and church, they have been able to stay "alfoat."    ? She is a former smoker who quit in 2004 after a 30-pack-year history.  ? She is active chasing a 30-year-old child, does not do routine exercise. She's been quite depressed with the condition of her husband, and admits to eating comfort herself.  ? She does not drink alcohol.  ?   ? 05/13/2021 Patient reports that her husband is deceased. She is grieving the loss of her husband and the decline in her physical independence.   ? ?Social Determinants of Health  ? ?Financial Resource Strain: Not on file  ?Food Insecurity: No Food Insecurity  ? Worried About Charity fundraiser in the Last Year: Never true  ? Ran Out of Food in the Last Year: Never true  ?Transportation Needs: No Transportation Needs  ? Lack of Transportation (Medical): No  ? Lack of Transportation (Non-Medical): No  ?Physical Activity: Not on file  ?Stress: Not on file  ?Social Connections: Not on file  ? ?Education Assessment and Provision: ? ?Detailed education and instructions provided on heart failure disease management including the following: ? ?Signs and symptoms  of Heart Failure ?When to call the physician ?Importance of daily weights ?Low sodium diet ?Fluid restriction ?Medication management ?Anticipated future follow-up appointments ? ?Patient education given on each of the above topics.  Patient acknowledges understanding via teach back method and acceptance of all instructions. ? ?Education Materials:  "Living Better With Heart Failure" Booklet, HF zone tool, & Daily Weight Tracker Tool. ? ?Patient has scale at home: No, Gave patient one.  ?Patient has pill box at home: NA   ?High Risk Criteria for Readmission and/or Poor Patient Outcomes: ?Heart failure hospital admissions (last 6 months): 3  ?No Show rate: 11 % ?Difficult social situation: No ?Demonstrates  medication adherence: Yes ?Primary Language: English ?Literacy level: Reading, writing, and comprehension.  ? ?Barriers of Care:   ?Fluid restrictions ( soda) ?Mobility ( using a walker/cane) ? ?Considerations/Referrals:  ? ?Referral made to Heart Failure Pharmacist Stewardship: No ?Referral made to Heart Failure CSW/NCM TOC: No ?Referral made to Heart & Vascular TOC clinic: Yes, 12/23/2021 @ 2 pm.  ? ?Items for Follow-up on DC/TOC: ?Optimize medication ?Fluid restrictions ( Soda) ?Daily weights ( Gave patient scale) ? ? ? ?Earnestine Leys, BSN, RN ?Heart Failure Nurse Navigator ?Secure Chat Only  ? ?

## 2021-12-10 NOTE — Progress Notes (Signed)
PT Cancellation Note ? ?Patient Details ?Name: Shaylea Ucci ?MRN: [id number] ?DOB: 12/24/1955 ? ? ?Cancelled Treatment:    Reason Eval/Treat Not Completed: Other (comment) Pt requesting to sleep. ? ?Wyona Almas, PT, DPT ?Acute Rehabilitation Services ?Pager [phone] ?Office [phone] ? ? ? ?Carloine Margo Aye ?12/10/2021, 11:36 AM ?

## 2021-12-10 NOTE — Progress Notes (Signed)
? ?Progress Note ? ?Patient Name: Valor Turberville ?Date of Encounter: 12/10/2021 ? ?[facility] Cardiologist: [name], MD  ? ?Subjective  ? ?Feeling better today.  Issues with marked hyperglycemia noted.  Breathing is improved.  No further chest discomfort. ? ?Inpatient Medications  ?  ?Scheduled Meds: ? arformoterol  15 mcg Nebulization BID  ? azaTHIOprine  125 mg Oral Daily  ? budesonide  2 mL Nebulization BID  ? calcitRIOL  0.25 mcg Oral Q72H  ? clopidogrel  75 mg Oral Daily  ? furosemide  40 mg Intravenous BID  ? gabapentin  100 mg Oral QHS  ? insulin aspart  0-5 Units Subcutaneous QHS  ? insulin aspart  0-9 Units Subcutaneous TID WC  ? insulin glargine-yfgn  20 Units Subcutaneous Daily  ? ipratropium-albuterol  3 mL Nebulization BID  ? isosorbide mononitrate  60 mg Oral Daily  ? lamoTRIgine  25 mg Oral QHS  ? metoprolol tartrate  25 mg Oral Q8H  ? montelukast  10 mg Oral QHS  ? OLANZapine  5 mg Oral QHS  ? pantoprazole  40 mg Oral Daily  ? potassium chloride  20 mEq Oral BID  ? predniSONE  40 mg Oral Q breakfast  ? ranolazine  1,000 mg Oral BID  ? rosuvastatin  20 mg Oral Daily  ? sertraline  100 mg Oral Daily  ? sodium chloride flush  3 mL Intravenous Q12H  ? ?Continuous Infusions: ? heparin 1,200 Units/hr (12/10/21 8119)  ? ?PRN Meds: ?acetaminophen **OR** acetaminophen, albuterol, LORazepam, nitroGLYCERIN, oxyCODONE  ? ?Vital Signs  ?  ?Vitals:  ? 12/10/21 1478 12/10/21 2956 12/10/21 2130 12/10/21 8657  ?BP:  (!) 132/96    ?Pulse: 80 83    ?Resp: 16 18    ?Temp:  97.7 ?F (36.5 ?C)    ?TempSrc:  Oral    ?SpO2: 99% 97% 93% 93%  ?Weight:      ?Height:      ? ? ?Intake/Output Summary (Last 24 hours) at 12/10/2021 0833 ?Last data filed at 12/10/2021 0433 ?Gross per 24 hour  ?Intake 888.45 ml  ?Output 2500 ml  ?Net -1611.55 ml  ? ? ?  12/10/2021  ?  4:31 AM 12/09/2021  ?  3:17 AM 12/08/2021  ?  5:08 PM  ?Last 3 Weights  ?Weight (lbs) 204 lb 9.4 oz 190 lb 4.1 oz 189 lb 4.8 oz  ?Weight (kg) 92.8 kg 86.3 kg  85.866 kg  ?   ? ?Telemetry  ?  ?Atrial fibrillation, heart rate control - Personally Reviewed ? ? ?Physical Exam  ?Alert, oriented, chronically ill-appearing, on O2 ?GEN: No acute distress.   ?Neck: No JVD ?Cardiac: Irregularly irregular, no murmurs, rubs, or gallops.  ?Respiratory: Clear to auscultation bilaterally. ?GI: Soft, nontender, non-distended  ?MS: No edema; No deformity. ?Neuro:  Nonfocal  ?Psych: Normal affect  ? ?Labs  ?  ?High Sensitivity Troponin:   ?Recent Labs  ?Lab 12/07/21 ?2125 12/08/21 ?0019 12/08/21 ?0241 12/08/21 ?0838  ?TROPONINIHS 15 36* 969* 3,156*  ?   ?Chemistry ?Recent Labs  ?Lab 12/08/21 ?0019 12/08/21 ?0025 12/08/21 ?0241 12/09/21 ?8469 12/10/21 ?6295  ?NA  --    < > 132* 132* 132*  ?K  --    < > 4.8 3.4* 3.6  ?CL  --   --  98 94* 94*  ?CO2  --   --  '23 28 29  '$ ?GLUCOSE  --   --  407* 358* 391*  ?BUN  --   --  $'11 18 17  'd$ ?CREATININE  --   --  1.00 1.01* 1.12*  ?CALCIUM  --   --  9.0 8.8* 9.1  ?MG 2.2  --   --   --   --   ?PROT  --   --  7.2  --   --   ?ALBUMIN  --   --  2.6*  --   --   ?AST  --   --  28  --   --   ?ALT  --   --  9  --   --   ?ALKPHOS  --   --  119  --   --   ?BILITOT  --   --  0.8  --   --   ?GFRNONAA  --   --  >60 >60 55*  ?ANIONGAP  --   --  '11 10 9  '$ ? < > = values in this interval not displayed.  ?  ?Lipids No results for input(s): CHOL, TRIG, HDL, LABVLDL, LDLCALC, CHOLHDL in the last 168 hours.  ?Hematology ?Recent Labs  ?Lab 12/08/21 ?0434 12/09/21 ?2725 12/10/21 ?3664  ?WBC 7.1 10.6* 10.0  ?RBC 4.17 3.60* 3.55*  ?HGB 11.3* 9.7* 9.6*  ?HCT 36.7 31.5* 30.8*  ?MCV 88.0 87.5 86.8  ?MCH 27.1 26.9 27.0  ?MCHC 30.8 30.8 31.2  ?RDW 19.7* 19.6* 19.2*  ?PLT 280 279 273  ? ?Thyroid No results for input(s): TSH, FREET4 in the last 168 hours.  ?BNP ?Recent Labs  ?Lab 12/07/21 ?2125  ?BNP 1,347.1*  ?  ?DDimer No results for input(s): DDIMER in the last 168 hours.  ? ?Radiology  ?  ?ECHOCARDIOGRAM COMPLETE ? ?Result Date: 12/09/2021 ?   ECHOCARDIOGRAM REPORT   Patient Name:    PREZLEY QADIR Date of Exam: 12/08/2021 Medical Rec #:  [id number]          Height:       61.0 in Accession #:    [id number]         Weight:       180.0 lb Date of Birth:  July 05, 1956          BSA:          1.806 m? Patient Age:    33 years           BP:           91/61 mmHg Patient Gender: F                  HR:           90 bpm. Exam Location:  Inpatient Procedure: 2D Echo, Cardiac Doppler, Color Doppler and Intracardiac            Opacification Agent Indications:    NSTEMI I21.4  History:        Patient has prior history of Echocardiogram examinations, most                 recent 10/02/2021. Previous Myocardial Infarction and CAD, Stroke                 and COPD, Arrythmias:Atrial Fibrillation; Risk                 Factors:Hypertension, Diabetes, Dyslipidemia and Sleep Apnea.  Sonographer:    Bernadene Person RDCS Referring Phys: [id number] [place]  1. Left ventricular ejection fraction, by estimation, is 45 to 50%. The left ventricle has mildly decreased function. The left ventricle demonstrates regional wall  motion abnormalities (see scoring diagram/findings for description). There is mild concentric left ventricular hypertrophy. Left ventricular diastolic parameters are consistent with Grade III diastolic dysfunction (restrictive). There is akinesis of the left ventricular, mid inferoseptal wall. There is akinesis of the left ventricular,  apical septal wall.  2. Right ventricular systolic function is normal. The right ventricular size is normal. There is normal pulmonary artery systolic pressure. The estimated right ventricular systolic pressure is 93.8 mmHg.  3. Left atrial size was mildly dilated.  4. The mitral valve is normal in structure. Trivial mitral valve regurgitation. Mild to moderate mitral stenosis. Moderate mitral annular calcification.  5. The aortic valve was not well visualized. There is moderate calcification of the aortic valve. Aortic valve regurgitation is trivial. Mild  aortic valve stenosis. Aortic valve area, by VTI measures 1.34 cm?[name] Kitchen Aortic valve mean gradient measures 10.0 mmHg. Aortic valve Vmax measures 2.20 m/s. LV Stroke Volume index 30, DVI 0.39.  6. The inferior vena cava is normal in size with greater than 50% respiratory variability, suggesting right atrial pressure of 3 mmHg.  7. IT Issues from 12/08/21. Study resent 12/09/21 for read and review. Comparison(s): Similar to prior. FINDINGS  Left Ventricle: Left ventricular ejection fraction, by estimation, is 45 to 50%. The left ventricle has mildly decreased function. The left ventricle demonstrates regional wall motion abnormalities. Definity contrast agent was given IV to delineate the left ventricular endocardial borders. The left ventricular internal cavity size was normal in size. There is mild concentric left ventricular hypertrophy. Left ventricular diastolic function could not be evaluated due to atrial fibrillation. Left ventricular diastolic parameters are consistent with Grade III diastolic dysfunction (restrictive).  LV Wall Scoring: The apex is dyskinetic. The inferior septum is akinetic. Right Ventricle: The right ventricular size is normal. No increase in right ventricular wall thickness. Right ventricular systolic function is normal. There is normal pulmonary artery systolic pressure. The tricuspid regurgitant velocity is 2.75 m/s, and  with an assumed right atrial pressure of 3 mmHg, the estimated right ventricular systolic pressure is 18.2 mmHg. Left Atrium: Left atrial size was mildly dilated. Right Atrium: Right atrial size was normal in size. Pericardium: There is no evidence of pericardial effusion. Mitral Valve: The mitral valve is normal in structure. Moderate mitral annular calcification. Trivial mitral valve regurgitation. Mild to moderate mitral valve stenosis. Tricuspid Valve: The tricuspid valve is normal in structure. Tricuspid valve regurgitation is mild . No evidence of tricuspid stenosis.  Aortic Valve: The aortic valve was not well visualized. There is moderate calcification of the aortic valve. Aortic valve regurgitation is trivial. Mild aortic stenosis is present. Aortic valve mean gradien

## 2021-12-10 NOTE — Progress Notes (Signed)
? ?PROGRESS NOTE ? ? ? ?Katherine [name]  JQZ:[id number] DOB: March 17, 1956 DOA: 12/07/2021 ?PCP: [name], MD ? ? ?Brief Narrative: ? ?Katherine [name] is a 66 y.o. female with medical history significant of chronic respiratory failure, COPD, asthma, CAD status post stenting, CABG and in grafts stenting, PAD, carotid artery disease, hyperlipidemia, OSA, obesity status post lap band, paroxysmal A-fib, polymorphic [name], CHF, GERD, ESRD status post renal transplant, hypertension, anemia, depression, anxiety, CVA presenting with worsening shortness of breath. ?Patient reports 2 days of progressive shortness of breath.  She is on 5 L chronically at home and has had to increase fluids over the past 2 days and has remained short of breath.  She was requiring up to 10 L oxygen upon admission, as well her work-up was noted for significant trend up and troponins with concern of  NSTEMI's, so she was admitted for further work up. ? ? ?Assessment and Plan: ? ?Acute on chronic respiratory failure with hypoxia ?Secondary to COPD exacerbation and likely also secondary to acute heart failure. Baseline oxygen use of 5 L/min via [place]. Patient requiring up to 6 L/min with associated dyspnea and hypoxia into 70s on home oxygen.  ? ?COPD exacerbation ?Patient treated with IV steroids and duonebs with improvement. Transitioned to prednisone PO. ?-Transition to home prednisone in AM ?-Continue Duoneb PRN ?-Discontinue budesonide secondary to thrush ?-Continue Brovana, Singulair ? ?Acute on chronic combined systolic and diastolic heart failure ?LVEF of 45-50% with grade III diastolic dysfunction on Transthoracic Echocardiogram from 4/24.  ? ?Non-STEMI ?Patient with chest pain and associated troponin rise of 36 > 969 > 3,156. Recommendation for no cardiac catheterization at this time secondary to no vascular access. Cardiology recommending medical management. She was managed on Heparin IV for 48 hours. Ranolazine increased to 1000  mg BID. Continuing metoprolol and isosorbide mononitrate. ?-Continue Ranolazine, metoprolol and isosorbide mononitrate  ? ?Diabetes mellitus, type 2 ?Hemoglobin A1C of 7.8% from 10/20/21. Blood sugar significantly uncontrolled, likely in part to steroid dosing. Semglee increased from 15 units to 20 units daily. Previous BMP without development of acidosis. ?-Increase to severe SSI ?-Continue Semglee 20 units daily ? ?Oral thrush ?Possibly secondary to budesonide. ?-Discontinue budesonide ?-Nystatin oral ? ?CAD ?PAD ?Carotid artery disease ?-Continue Crestor ? ?Hyperlipidemia ?-Continue Crestor ? ?Primary hypertension ?-Continue metoprolol and isosorbide mononitrate ? ?Persistent atrial fibrillation ?Rate controlled ?-Continue Eliquis and metoprolol ? ?Chronic pain ?-Continue oxycodone PRN ? ?GERD ?-Continue Protonix ? ?History of ESRD s/p renal transplant ?Patient is on prednisone and azathioprine as an outpatient ?-Continue azathioprine ?-Resume home prednisone 5 mg daily dose ? ?Anemia ?Chronic. Baseline appears to be 10-11. Slight drift down to 9.6 today. No evidence of bleeding. Possibly related to chronic disease in setting of renal disease.  ? ?Anxiety ?Depression ?-Continue Zyprexa, Zoloft, Ativan PRN ? ?History of CVA ?-Continue Plavix and Crestor ? ?Morbid obesity ?S/p lap band ? ?Right heel pressure injury ?Present on admission.  ? ? ?DVT prophylaxis: Eliquis ?Code Status:   Code Status: DNR ?Family Communication: None at bedside ?Disposition Plan: Discharge home likely in 1-2 days pending improvement of blood sugar and PT/OT recommendations ? ? ?Consultants:  ?Cardiology ? ?Procedures:  ?Transthoracic Echocardiogram  ? ?Antimicrobials: ?None  ? ? ?Subjective: ?Did not sleep well last night. Chest pain is improved. She reports some dyspnea overnight ? ?Objective: ?BP (!) 132/96   Pulse 83   Temp 97.7 ?F (36.5 ?C) (Oral)   Resp 18   Ht '5\' 1"'$  (1.549 m)  Wt 92.8 kg   SpO2 93%   BMI 38.66 kg/m?   ? ?Examination: ? ?General exam: Appears calm and comfortable ?Respiratory system: Clear to auscultation. Respiratory effort normal. ?Cardiovascular system: S1 & S2 heard, Irregular rhythm with normal rate. 1/6 systolic murmur. ?Gastrointestinal system: Abdomen is nondistended, soft and nontender. Normal bowel sounds heard. Right lower abdominal non-tender, firm nodule ?Central nervous system: Alert and oriented. No focal neurological deficits. ?Musculoskeletal: No edema. No calf tenderness ?Skin: No cyanosis. No rashes ?Psychiatry: Judgement and insight appear normal. Mood & affect appropriate.  ? ? ?Data Reviewed: I have personally reviewed following labs and imaging studies ? ?CBC ?Lab Results  ?Component Value Date  ? WBC 10.0 12/10/2021  ? RBC 3.55 (L) 12/10/2021  ? HGB 9.6 (L) 12/10/2021  ? HCT 30.8 (L) 12/10/2021  ? MCV 86.8 12/10/2021  ? MCH 27.0 12/10/2021  ? PLT 273 12/10/2021  ? MCHC 31.2 12/10/2021  ? RDW 19.2 (H) 12/10/2021  ? LYMPHSABS 0.9 12/07/2021  ? MONOABS 0.3 12/07/2021  ? EOSABS 0.0 12/07/2021  ? BASOSABS 0.1 12/07/2021  ? ? ? ?Last metabolic panel ?Lab Results  ?Component Value Date  ? NA 132 (L) 12/10/2021  ? K 3.6 12/10/2021  ? CL 94 (L) 12/10/2021  ? CO2 29 12/10/2021  ? BUN 17 12/10/2021  ? CREATININE 1.12 (H) 12/10/2021  ? GLUCOSE 391 (H) 12/10/2021  ? GFRNONAA 55 (L) 12/10/2021  ? GFRAA 58 (L) 04/12/2020  ? CALCIUM 9.1 12/10/2021  ? PHOS 4.3 08/15/2021  ? PROT 7.2 12/08/2021  ? ALBUMIN 2.6 (L) 12/08/2021  ? BILITOT 0.8 12/08/2021  ? ALKPHOS 119 12/08/2021  ? AST 28 12/08/2021  ? ALT 9 12/08/2021  ? ANIONGAP 9 12/10/2021  ? ? ?GFR: ?Estimated Creatinine Clearance: 52.7 mL/min (A) (by C-G formula based on SCr of 1.12 mg/dL (H)). ? ?Recent Results (from the past 240 hour(s))  ?Respiratory (~20 pathogens) panel by PCR     Status: None  ? Collection Time: 12/07/21 11:17 PM  ? Specimen: Nasopharyngeal Swab; Respiratory  ?Result Value Ref Range Status  ? Adenovirus NOT DETECTED NOT DETECTED  Final  ? Coronavirus 229E NOT DETECTED NOT DETECTED Final  ?  Comment: (NOTE) ?The Coronavirus on the Respiratory Panel, DOES NOT test for the novel  ?Coronavirus (2019 nCoV) ?  ? Coronavirus HKU1 NOT DETECTED NOT DETECTED Final  ? Coronavirus NL63 NOT DETECTED NOT DETECTED Final  ? Coronavirus OC43 NOT DETECTED NOT DETECTED Final  ? Metapneumovirus NOT DETECTED NOT DETECTED Final  ? Rhinovirus / Enterovirus NOT DETECTED NOT DETECTED Final  ? Influenza A NOT DETECTED NOT DETECTED Final  ? Influenza B NOT DETECTED NOT DETECTED Final  ? Parainfluenza Virus 1 NOT DETECTED NOT DETECTED Final  ? Parainfluenza Virus 2 NOT DETECTED NOT DETECTED Final  ? Parainfluenza Virus 3 NOT DETECTED NOT DETECTED Final  ? Parainfluenza Virus 4 NOT DETECTED NOT DETECTED Final  ? Respiratory Syncytial Virus NOT DETECTED NOT DETECTED Final  ? Bordetella pertussis NOT DETECTED NOT DETECTED Final  ? Bordetella Parapertussis NOT DETECTED NOT DETECTED Final  ? Chlamydophila pneumoniae NOT DETECTED NOT DETECTED Final  ? Mycoplasma pneumoniae NOT DETECTED NOT DETECTED Final  ?  Comment: Performed at [facility], [place] [address], [place], [place] [address]  ?  ? ? ?Radiology Studies: ?ECHOCARDIOGRAM COMPLETE ? ?Result Date: 12/09/2021 ?   ECHOCARDIOGRAM REPORT   Patient Name:   ALVIS PULCINI Date of Exam: 12/08/2021 Medical Rec #:  [id number]  Height:       61.0 in Accession #:    [id number]         Weight:       180.0 lb Date of Birth:  05-11-1956          BSA:          1.806 m? Patient Age:    36 years           BP:           91/61 mmHg Patient Gender: F                  HR:           90 bpm. Exam Location:  Inpatient Procedure: 2D Echo, Cardiac Doppler, Color Doppler and Intracardiac            Opacification Agent Indications:    NSTEMI I21.4  History:        Patient has prior history of Echocardiogram examinations, most                 recent 10/02/2021. Previous Myocardial Infarction and CAD, Stroke                 and COPD,  Arrythmias:Atrial Fibrillation; Risk                 Factors:Hypertension, Diabetes, Dyslipidemia and Sleep Apnea.  Sonographer:    Bernadene Person RDCS Referring Phys: [id number] [place]  1. Left ventri

## 2021-12-10 NOTE — TOC Benefit Eligibility Note (Signed)
Patient Advocate Encounter ? ?Insurance verification completed.   ? ?The patient is currently admitted and upon discharge could be taking Farxiga 10 mg. ? ?The current 30 day co-pay is, $4.30.  ? ?The patient is currently admitted and upon discharge could be taking Jardiance 10 mg. ? ?The current 30 day co-pay is, $4.30.  ? ?The patient is insured through [employer] Medicare Part D  ? ? ? ?Lyndel Safe, CPhT ?Pharmacy Patient Advocate Specialist ?[facility] Patient Advocate Team ?Direct Number: [phone]  Fax: [phone] ? ? ? ? ? ?  ?

## 2021-12-10 NOTE — Progress Notes (Signed)
TRH night cross cover note: ? ?I was notified by RN of patient's nightly CBG result of 498, for which she had received 5 units of sliding scale NovoLog coverage.  Additionally, this was after receiving 9 units of sliding scale coverage per before every meal CBG at 1720.  She is also on basal insulin in the form of glargine 15 units subcu every morning, with confirmation that she has received this dose of glargine each of the last 2 mornings. ? ?Per my chart review, including review of most recent rounding hospitalist progress note, this patient with type 2 diabetes mellitus who is on chronic low-dose prednisone in the context of a history of renal transplant, and was transition from Solu-Medrol to oral prednisone on 12/09/2021 in the setting of initial solumedrol therapy for acute COPD exacerbation.  ? ?Per chart review, the patient's blood sugars have been running in the 300s throughout the day, with this evening's qhs value of 498 representing diet such documented.  Given persistent hyperglycemia throughout the day and into the evening, I have increased her basal insulin dose from 15 units subcu every morning to 20 units SQ every morning, with this updated dose to occur on the morning of 12/10/2021.   ? ?I have ordered a repeat CBG to occur at 2330 to monitor response from aforementioned 5 units of sliding scale insulin via qhs coverage, and requested to be notified if CBG result remains greater than 400. ? ? ? ? ?Update: Repeat CBG at 2330 noted to be 459.  I placed order for an additional 8 units of subcu NovoLog x1, ordered repeat CBG to be checked at 0130, and asked to be notified if this result remains greater than 400.  I also confirmed that the patient has not been eating in the interval.  ? ? ? ?Babs Bertin, DO ?Hospitalist ? ?

## 2021-12-10 NOTE — Progress Notes (Signed)
ANTICOAGULATION CONSULT NOTE ? ?Pharmacy Consult for Heparin > apixaban ?Indication: Elevated troponin, afib ? ?Allergies  ?Allergen Reactions  ? Tetracycline Hives  ?  Patient tolerated Doxycycline Dec 2020  ? Niacin Other (See Comments)  ?  Mouth blisters  ? Niaspan [Niacin Er] Other (See Comments)  ?  Mouth blisters  ? Sulfa Antibiotics Nausea Only and Other (See Comments)  ?  "Tears up stomach"  ? Sulfonamide Derivatives Other (See Comments)  ?  Reaction: per patient "tears her stomach up"  ? Codeine Nausea And Vomiting  ? Erythromycin Nausea And Vomiting  ? Hydromorphone Hcl Nausea And Vomiting  ? Morphine And Related Nausea And Vomiting  ? Nalbuphine Nausea And Vomiting  ?  Nubain  ? Sulfasalazine Nausea Only and Other (See Comments)  ?  per patient "tears her stomach up", "Tears up stomach"  ? Tape Rash and Other (See Comments)  ?  No "plastic" tape," please----cloth tape only  ? ? ?Patient Measurements: ?Height: '5\' 1"'$  (154.9 cm) ?Weight: 92.8 kg (204 lb 9.4 oz) ?IBW/kg (Calculated) : 47.8 ?Heparin dosing weight: 67.7 kg ? ?Vital Signs: ?Temp: 97.7 ?F (36.5 ?C) (04/26 0881) ?Temp Source: Oral (04/26 1031) ?BP: 132/96 (04/26 0727) ?Pulse Rate: 83 (04/26 0727) ? ?Labs: ?Recent Labs  ?  12/08/21 ?0019 12/08/21 ?0025 12/08/21 ?0241 12/08/21 ?0434 12/08/21 ?5945 12/08/21 ?2115 12/09/21 ?8592 12/09/21 ?1343 12/09/21 ?2111 12/09/21 ?2253 12/10/21 ?9244 12/10/21 ?6286  ?HGB  --    < >  --  11.3*  --   --  9.7*  --   --   --  9.6*  --   ?HCT  --    < >  --  36.7  --   --  31.5*  --   --   --  30.8*  --   ?PLT  --   --   --  280  --   --  279  --   --   --  273  --   ?APTT  --   --   --   --   --    < > 72* 42* 131* 131*  --  107*  ?HEPARINUNFRC  --   --   --   --   --    < > >1.10* >1.10*  --   --   --  >1.10*  ?CREATININE  --   --  1.00  --   --   --  1.01*  --   --   --  1.12*  --   ?TROPONINIHS 36*  --  969*  --  3,156*  --   --   --   --   --   --   --   ? < > = values in this interval not displayed.   ? ? ? ?Estimated Creatinine Clearance: 52.7 mL/min (A) (by C-G formula based on SCr of 1.12 mg/dL (H)). ? ? ?Medical History: ?Past Medical History:  ?Diagnosis Date  ? Anemia   ? Anxiety   ? Bilateral carotid artery stenosis   ? Carotid duplex 10/8175: 1-16% LICA, 57-90% RICA, >38% RECA, f/u 1 yr suggested  ? CAD (coronary artery disease) of bypass graft 5/01; 3/'02, 8/'03, 10/'04; 1/15  ? PCI x 5 to SVG-D1   ? CAD in native artery 07/1993  ? 3 Vessel Disease (LAD-D1 & RCA) -- CABG (Dx in setting of inferior STEMI-PTCA of RCA)  ? COPD mixed type ([place])   ? Followed by Dr. Lamonte Sakai "  pulmonologist said no COPD"  ? Depression with anxiety   ? Diabetes mellitus type 2 in obese [facility])   ? Diarrhea   ? started after cholecystectomy and mass removed from intestine  ? Dyslipidemia, goal LDL below 70   ? 08/2012: TC 137, TG 200, HDL 32!, LDL 45; on statin (followed by Dr.Deterding)  ? ESRD (end stage renal disease) ([place]) 1991  ? s/p Cadaveric Renal Transplant [facility] - Dr. Jimmy Footman)   ? Family history of adverse reaction to anesthesia   ? mom's bp dropped during/after anesthesia  ? Fibromyalgia   ? GERD (gastroesophageal reflux disease)   ? 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 07/1993  ? H/O: GI bleed   ? History of kidney stones   ? History of stroke 2012  ? "right eye stroke- half blind now"  ? 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 Witnessed syncopal event.  Had having having lots of nausea and vomiting with poor p.o. intake.  Thought to have QT prolongation with multiple medications involved and hypomagnesemia, hypokalemia.  Tikosyn discontinued along with Zoloft and Phenergan.  ? Hypertension associated with diabetes ([place])   ? Mild aortic stenosis by prior echocardiogram 07/2019  ? Echo:  Mild aortic stenosis (gradients: Mean 14.3 mmHg -peak 24.9 mmHg).  ? Morbid obesity ([place])   ? MRSA (methicillin resistant staph aureus) culture positive   ? OSA (obstructive sleep apnea)   ?  no longer on CPAP or home O2, states she doesn't need now after lap band  ? PAD (peripheral artery disease) ([place]) 08/2013  ? LEA Dopplers to be read by Dr. Fletcher Anon  ? PAF (paroxysmal atrial fibrillation) ([place]) 06/2014  ? Noted on CardioNet Monitor  - --> rhythm control with Tikosyn (Dr. Rayann Heman); converted from warfarin to apixaban for anticoagulation.  ? Pneumonia   ? Recurrent boils   ? Bilateral Groin  ? Rheumatoid arthritis ([place])   ? Per Patient Report; associated with OA  ? S/p cadaver renal transplant 1991  ? [place]  ? ? ? ?Assessment: ?66 y/o F with elevated troponin, on apixaban PTA for afib. Apixiban was on hold while on heparin and now to resume apixaban ?-hg= 9.6, plt= 273 ? ?Goal of Therapy:  ?Heparin level 0.3-0.7 units/ml ?aPTT 66-102 seconds ?Monitor platelets by anticoagulation protocol: Yes ?  ?Plan:  ?-Discontinue heparin and resume apixaban '5mg'$  po bid ? ?Hildred Laser, PharmD ?Clinical Pharmacist ?**Pharmacist phone directory can now be found on [email or web address] ([email or web address]).  Listed under [facility]. ? ? ? ? ?

## 2021-12-11 DIAGNOSIS — F419 Anxiety disorder, unspecified: Secondary | ICD-10-CM | POA: Diagnosis not present

## 2021-12-11 DIAGNOSIS — I2510 Atherosclerotic heart disease of native coronary artery without angina pectoris: Secondary | ICD-10-CM | POA: Diagnosis not present

## 2021-12-11 DIAGNOSIS — J9621 Acute and chronic respiratory failure with hypoxia: Secondary | ICD-10-CM | POA: Diagnosis not present

## 2021-12-11 DIAGNOSIS — I5043 Acute on chronic combined systolic (congestive) and diastolic (congestive) heart failure: Secondary | ICD-10-CM | POA: Diagnosis not present

## 2021-12-11 LAB — CBC
HCT: 31 % — ABNORMAL LOW (ref 36.0–46.0)
Hemoglobin: 9.5 g/dL — ABNORMAL LOW (ref 12.0–15.0)
MCH: 26.8 pg (ref 26.0–34.0)
MCHC: 30.6 g/dL (ref 30.0–36.0)
MCV: 87.3 fL (ref 80.0–100.0)
Platelets: 306 10*3/uL (ref 150–400)
RBC: 3.55 MIL/uL — ABNORMAL LOW (ref 3.87–5.11)
RDW: 18.8 % — ABNORMAL HIGH (ref 11.5–15.5)
WBC: 12.5 10*3/uL — ABNORMAL HIGH (ref 4.0–10.5)
nRBC: 0 % (ref 0.0–0.2)

## 2021-12-11 LAB — BASIC METABOLIC PANEL
Anion gap: 8 (ref 5–15)
BUN: 23 mg/dL (ref 8–23)
CO2: 29 mmol/L (ref 22–32)
Calcium: 9.3 mg/dL (ref 8.9–10.3)
Chloride: 96 mmol/L — ABNORMAL LOW (ref 98–111)
Creatinine, Ser: 1.13 mg/dL — ABNORMAL HIGH (ref 0.44–1.00)
GFR, Estimated: 54 mL/min — ABNORMAL LOW (ref >60)
Glucose, Bld: 169 mg/dL — ABNORMAL HIGH (ref 70–99)
Potassium: 4.2 mmol/L (ref 3.5–5.1)
Sodium: 133 mmol/L — ABNORMAL LOW (ref 135–145)

## 2021-12-11 LAB — RESP PANEL BY RT-PCR (FLU A&B, COVID) ARPGX2
Influenza A by PCR: NEGATIVE
Influenza B by PCR: NEGATIVE
SARS Coronavirus 2 by RT PCR: NEGATIVE

## 2021-12-11 LAB — GLUCOSE, CAPILLARY
Glucose-Capillary: 224 mg/dL — ABNORMAL HIGH (ref 70–99)
Glucose-Capillary: 313 mg/dL — ABNORMAL HIGH (ref 70–99)
Glucose-Capillary: 378 mg/dL — ABNORMAL HIGH (ref 70–99)
Glucose-Capillary: 255 mg/dL — ABNORMAL HIGH (ref 70–99)

## 2021-12-11 MED ORDER — ADULT MULTIVITAMIN W/MINERALS CH
1.0000 | ORAL_TABLET | Freq: Every day | ORAL | Status: DC
  Administered 2021-12-11 – 2021-12-12 (×2): 1 via ORAL
  Filled 2021-12-11 (×2): qty 1

## 2021-12-11 MED ORDER — ENSURE MAX PROTEIN PO LIQD
11.0000 [oz_av] | Freq: Two times a day (BID) | ORAL | Status: DC
  Administered 2021-12-11 – 2021-12-12 (×4): 11 [oz_av] via ORAL
  Filled 2021-12-11 (×5): qty 330

## 2021-12-11 MED ORDER — MENTHOL 3 MG MT LOZG
1.0000 | LOZENGE | OROMUCOSAL | Status: DC | PRN
  Administered 2021-12-11: 3 mg via ORAL
  Filled 2021-12-11: qty 9

## 2021-12-11 NOTE — Progress Notes (Signed)
Inpatient Diabetes Program Recommendations ? ?AACE/ADA: New Consensus Statement on Inpatient Glycemic Control (2015) ? ?Target Ranges:  Prepandial:   less than 140 mg/dL ?     Peak postprandial:   less than 180 mg/dL (1-2 hours) ?     Critically ill patients:  140 - 180 mg/dL  ? ?Lab Results  ?Component Value Date  ? GLUCAP 224 (H) 12/11/2021  ? HGBA1C 7.8 (H) 10/20/2021  ? ? ?Review of Glycemic Control ? Latest Reference Range & Units 12/10/21 06:10 12/10/21 11:37 12/10/21 16:26 12/10/21 21:10 12/11/21 06:15  ?Glucose-Capillary 70 - 99 mg/dL 255 (H) 448 (H) 483 (H) 341 (H) 224 (H)  ? ? ?History: DM2, COPD, ESRD/ Renal Transplant ?  ?Home DM Meds: Basaglar 20 units daily ?                            Novolog 4-12 units TID per SSI ?                            Amaryl 4 mg daily ?  ?Current Orders:Semglee 15 units q hs ? Novolog 0-9 units tid , hs 0-5 units ?     PO prednisone 5 mg Daily ?  ?Inpatient Diabetes Program Recommendations:   ? ?-Add Novolog 4 units tid meal coverage if eats 50% meal (home dose) ? ?Thank you, ?Tama Headings RN, MSN, BC-ADM ?Inpatient Diabetes Coordinator ?Team Pager [phone] (8a-5p) ? ? ?

## 2021-12-11 NOTE — Evaluation (Signed)
Physical Therapy Evaluation ?Patient Details ?Name: Katherine [name] ?MRN: [id number] ?DOB: 1956-01-07 ?Today's Date: 12/11/2021 ? ?History of Present Illness ? Pt is a 66 yo female presenting 12/07/21 with worsening SOB. Pt admitted with CHF, COPD, asthma exacerbations and uptrending troponins concerning for NSTEMI. Of note, has had multiple admissions this year already for NSTEMI's, pneumonia, CHF exacerbation. PMH: ESRD s/p renal transplant on chronic immunosuppression, CAD s/p CABG and multiple PCI most recently DES to SVG 14 February 2021, PAF on Eliquis, chronic diastolic CHF, COPD, chronic respiratory failure with hypoxia on 3 L supplemental O2 via [place] with exertion and sleep, insulin-dependent T2DM, HTN, HLD, depression/anxiety, chronic pain ?  ?Clinical Impression ? Pt presents with condition above and deficits mentioned below, see PT Problem List. PTA, she was living with her brother in a 1-level house with 5 STE. At baseline, pt was using a rollator for very short household distances with her brother assisting as needed. She would use transportation services to/from her home secondary to being unable to navigate the stairs to access her home. She will need transportation services home at d/c. Currently, pt displays deficits in aerobic endurance, activity tolerance, balance, and gross overall strength limiting her functional mobility independence. Pt required up to minA for stability with standing and bedroom distance gait bouts with UE support. She is at high risk for falls. Pt reports a desire to improve her strength and independence and is agreeable to acute and HHPT to address her deficits and her goals. Will continue to follow acutely. ?   ? ?Recommendations for follow up therapy are one component of a multi-disciplinary discharge planning process, led by the attending physician.  Recommendations may be updated based on patient status, additional functional criteria and insurance authorization. ? ?Follow Up  Recommendations Home health PT ? ?  ?Assistance Recommended at Discharge Frequent or constant Supervision/Assistance  ?Patient can return home with the following ? A little help with walking and/or transfers;A little help with bathing/dressing/bathroom;Assistance with cooking/housework;Assist for transportation;Help with stairs or ramp for entrance ? ?  ?Equipment Recommendations None recommended by PT (will need transportation services into home at d/c)  ?Recommendations for Other Services ?    ?  ?Functional Status Assessment Patient has had a recent decline in their functional status and demonstrates the ability to make significant improvements in function in a reasonable and predictable amount of time.  ? ?  ?Precautions / Restrictions Precautions ?Precautions: Fall ?Precaution Comments: Watch vitals ?Restrictions ?Weight Bearing Restrictions: No  ? ?  ? ?Mobility ? Bed Mobility ?Overal bed mobility: Needs Assistance ?Bed Mobility: Supine to Sit, Sit to Supine ?  ?  ?Supine to sit: Supervision, HOB elevated ?Sit to supine: Supervision, HOB elevated ?  ?General bed mobility comments: increased time, no assistance needed, supervision for safety, HOB elevated ?  ? ?Transfers ?Overall transfer level: Needs assistance ?Equipment used: None ?Transfers: Sit to/from Stand ?Sit to Stand: Min guard ?  ?  ?  ?  ?  ?General transfer comment: Min Guard A for safety, extra time to power up to stand. ?  ? ?Ambulation/Gait ?Ambulation/Gait assistance: Min assist ?Gait Distance (Feet): 20 Feet (x2 bouts of ~20 ft each) ?Assistive device: 1 person hand held assist, None ?Gait Pattern/deviations: Step-through pattern, Decreased stride length, Shuffle ?Gait velocity: reduced ?Gait velocity interpretation: <1.31 ft/sec, indicative of household ambulator ?  ?General Gait Details: Pt with slow, small, shuffling steps in the room. Pt benefiting from 1 HHA for stability as pt with lateral trunk  sway, minA for stability. ? ?Stairs ?  ?   ?  ?  ?  ? ?Wheelchair Mobility ?  ? ?Modified Rankin (Stroke Patients Only) ?  ? ?  ? ?Balance Overall balance assessment: Needs assistance ?Sitting-balance support: No upper extremity supported, Feet supported ?Sitting balance-Leahy Scale: Good ?  ?  ?Standing balance support: Single extremity supported, During functional activity ?Standing balance-Leahy Scale: Poor ?Standing balance comment: Fluctuating between Min guard and Min A for static balance with tendency to lean on at least 1 UE on object for sypport with dynamic standing tasks at sink ?  ?  ?  ?  ?  ?  ?  ?  ?  ?  ?  ?   ? ? ? ?Pertinent Vitals/Pain Pain Assessment ?Pain Assessment: 0-10 ?Pain Score: 4  ?Pain Location: generalized with mobility ?Pain Descriptors / Indicators: Discomfort, Grimacing ?Pain Intervention(s): Limited activity within patient's tolerance, Monitored during session, Repositioned  ? ? ?Home Living Family/patient expects to be discharged to:: Private residence ?Living Arrangements: Other relatives (Brother) ?Available Help at Discharge: Family;Available 24 hours/day ?Type of Home: House ?Home Access: Stairs to enter ?Entrance Stairs-Rails: Left ?Entrance Stairs-Number of Steps: 5 ?  ?Home Layout: One level ?Home Equipment: Rollator (4 wheels);Cane - single point;Tub bench;Grab bars - toilet;Wheelchair - manual;Hospital bed;BSC/3in1 ?Additional Comments: 5L O2 at baseline.  ?  ?Prior Function Prior Level of Function : Needs assist ?  ?  ?  ?Physical Assist : Mobility (physical);ADLs (physical) ?  ?  ?Mobility Comments: rollator for very short household distances. Brother helps as needed. Use of transportation for assistance to/from appointments and to assist with in and out of home as she doesnt use the steps ?ADLs Comments: Brother assists as needed for ADLs. Home hospice as well. Retired Educational psychologist ?  ? ? ?Hand Dominance  ? Dominant Hand: Right ? ?  ?Extremity/Trunk Assessment  ? Upper Extremity Assessment ?Upper Extremity  Assessment: Defer to OT evaluation ?  ? ?Lower Extremity Assessment ?Lower Extremity Assessment: Generalized weakness ?  ? ?Cervical / Trunk Assessment ?Cervical / Trunk Assessment: Kyphotic  ?Communication  ? Communication: No difficulties  ?Cognition Arousal/Alertness: Awake/alert ?Behavior During Therapy: [facility] for tasks assessed/performed ?Overall Cognitive Status: Within Functional Limits for tasks assessed ?  ?  ?  ?  ?  ?  ?  ?  ?  ?  ?  ?  ?  ?  ?  ?  ?  ?  ?  ? ?  ?General Comments General comments (skin integrity, edema, etc.): SpO2 90s% on 5L ? ?  ?Exercises    ? ?Assessment/Plan  ?  ?PT Assessment Patient needs continued PT services  ?PT Problem List Decreased strength;Decreased activity tolerance;Decreased balance;Decreased mobility;Cardiopulmonary status limiting activity ? ?   ?  ?PT Treatment Interventions DME instruction;Gait training;Stair training;Functional mobility training;Therapeutic activities;Therapeutic exercise;Balance training;Neuromuscular re-education;Patient/family education   ? ?PT Goals (Current goals can be found in the Care Plan section)  ?Acute Rehab PT Goals ?Patient Stated Goal: to get stronger and more independent ?PT Goal Formulation: With patient ?Time For Goal Achievement: 12/25/21 ?Potential to Achieve Goals: Fair ? ?  ?Frequency Min 3X/week ?  ? ? ?Co-evaluation PT/OT/SLP Co-Evaluation/Treatment: Yes ?Reason for Co-Treatment: Other (comment);To address functional/ADL transfers;For patient/therapist safety (pt likely to not tolerate 2 sessions) ?PT goals addressed during session: Mobility/safety with mobility;Balance ?  ?  ? ? ?  ?AM-PAC PT "6 Clicks" Mobility  ?Outcome Measure Help needed turning from your back to your side  while in a flat bed without using bedrails?: A Little ?Help needed moving from lying on your back to sitting on the side of a flat bed without using bedrails?: A Little ?Help needed moving to and from a bed to a chair (including a wheelchair)?: A  Little ?Help needed standing up from a chair using your arms (e.g., wheelchair or bedside chair)?: A Little ?Help needed to walk in hospital room?: A Little ?Help needed climbing 3-5 steps with a railing? : Total

## 2021-12-11 NOTE — Progress Notes (Signed)
? ?PROGRESS NOTE ? ? ? ?Katherine [name]  FBP:[id number] DOB: 07/29/1956 DOA: 12/07/2021 ?PCP: [name], MD ? ? ?Brief Narrative: ? ?Katherine [name] is a 66 y.o. female with medical history significant of chronic respiratory failure, COPD, asthma, CAD status post stenting, CABG and in grafts stenting, PAD, carotid artery disease, hyperlipidemia, OSA, obesity status post lap band, paroxysmal A-fib, polymorphic [name], CHF, GERD, ESRD status post renal transplant, hypertension, anemia, depression, anxiety, CVA presenting with worsening shortness of breath. ?Patient reports 2 days of progressive shortness of breath.  She is on 5 L chronically at home and has had to increase fluids over the past 2 days and has remained short of breath.  She was requiring up to 10 L oxygen upon admission, as well her work-up was noted for significant trend up and troponins with concern of  NSTEMI's, so she was admitted for further work up. ? ? ?Assessment and Plan: ? ?Acute on chronic respiratory failure with hypoxia ?Secondary to COPD exacerbation and likely also secondary to acute heart failure. Baseline oxygen use of 5 L/min via [place]. Patient requiring up to 6 L/min with associated dyspnea and hypoxia into 70s on home oxygen.  ? ?COPD exacerbation ?Patient treated with IV steroids and duonebs with improvement. Transitioned to prednisone PO. ?-Transition to home prednisone in AM ?-Continue Duoneb PRN ?-Discontinue budesonide secondary to thrush ?-Continue Brovana, Singulair ? ?Acute on chronic combined systolic and diastolic heart failure ?LVEF of 45-50% with grade III diastolic dysfunction on Transthoracic Echocardiogram from 4/24.  ? ?Non-STEMI ?Patient with chest pain and associated troponin rise of 36 > 969 > 3,156. Recommendation for no cardiac catheterization at this time secondary to no vascular access. Cardiology recommending medical management. She was managed on Heparin IV for 48 hours. Ranolazine increased to 1000  mg BID. Continuing metoprolol and isosorbide mononitrate. ?-Continue Ranolazine, metoprolol and isosorbide mononitrate  ? ?Diabetes mellitus, type 2 ?Hemoglobin A1C of 7.8% from 10/20/21. Blood sugar significantly uncontrolled, likely in part to steroid dosing. Semglee increased from 15 units to 20 units daily. Previous BMP without development of acidosis. ?-Continue severe SSI ?-Possible increase of Semglee depending on blood sugar today while on lower dose steroids ? ?Sore throat ?Associated sneezing/cough. No palpable lymph nodes. No evidence of thrush on exam. Possible viral illness. RVP negative. ?-Check flu/COVID ?-Discontinue Nystatin ?-Cepacol prn ?Possibly secondary to budesonide. ?-Discontinue budesonide ?-Nystatin oral ? ?CAD ?PAD ?Carotid artery disease ?-Continue Crestor ? ?Hyperlipidemia ?-Continue Crestor ? ?Primary hypertension ?-Continue metoprolol and isosorbide mononitrate ? ?Persistent atrial fibrillation ?Rate controlled ?-Continue Eliquis and metoprolol ? ?Chronic pain ?-Continue oxycodone PRN ? ?GERD ?-Continue Protonix ? ?History of ESRD s/p renal transplant ?Patient is on prednisone and azathioprine as an outpatient ?-Continue azathioprine ?-Continue home prednisone 5 mg daily dose ? ?Anemia ?Chronic. Baseline appears to be 10-11. Slight drift down to 9.6 today. No evidence of bleeding. Possibly related to chronic disease in setting of renal disease.  ? ?Anxiety ?Depression ?-Continue Zyprexa, Zoloft, Ativan PRN ? ?History of CVA ?-Continue Plavix and Crestor ? ?Morbid obesity ?S/p lap band ? ?Right heel pressure injury ?Present on admission.  ? ? ?DVT prophylaxis: Eliquis ?Code Status:   Code Status: DNR ?Family Communication: None at bedside ?Disposition Plan: Discharge home likely in 1 day pending improvement of blood sugar ? ? ?Consultants:  ?Cardiology ? ?Procedures:  ?Transthoracic Echocardiogram  ? ?Antimicrobials: ?None  ? ? ?Subjective: ?Patient with sore throat. She reports some  sneezing and cough as well.  ? ?  Objective: ?BP 102/71   Pulse 81   Temp 98 ?F (36.7 ?C) (Oral)   Resp 19   Ht '5\' 1"'$  (1.549 m)   Wt 88.3 kg   SpO2 90%   BMI 36.78 kg/m?  ? ?Examination: ? ?General exam: Appears calm and comfortable  ?HEENT: no palpable neck lymph nodes ?Respiratory system: Some rales noted. Respiratory effort normal. ?Cardiovascular system: S1 & S2 heard ?Gastrointestinal system: Abdomen is nondistended, soft and nontender. Normal bowel sounds heard. ?Central nervous system: Alert and oriented. No focal neurological deficits. ?Musculoskeletal: No calf tenderness ?Skin: No cyanosis. No rashes ?Psychiatry: Judgement and insight appear normal. Mood & affect appropriate.  ? ? ?Data Reviewed: I have personally reviewed following labs and imaging studies ? ?CBC ?Lab Results  ?Component Value Date  ? WBC 12.5 (H) 12/11/2021  ? RBC 3.55 (L) 12/11/2021  ? HGB 9.5 (L) 12/11/2021  ? HCT 31.0 (L) 12/11/2021  ? MCV 87.3 12/11/2021  ? MCH 26.8 12/11/2021  ? PLT 306 12/11/2021  ? MCHC 30.6 12/11/2021  ? RDW 18.8 (H) 12/11/2021  ? LYMPHSABS 0.9 12/07/2021  ? MONOABS 0.3 12/07/2021  ? EOSABS 0.0 12/07/2021  ? BASOSABS 0.1 12/07/2021  ? ? ? ?Last metabolic panel ?Lab Results  ?Component Value Date  ? NA 133 (L) 12/11/2021  ? K 4.2 12/11/2021  ? CL 96 (L) 12/11/2021  ? CO2 29 12/11/2021  ? BUN 23 12/11/2021  ? CREATININE 1.13 (H) 12/11/2021  ? GLUCOSE 169 (H) 12/11/2021  ? GFRNONAA 54 (L) 12/11/2021  ? GFRAA 58 (L) 04/12/2020  ? CALCIUM 9.3 12/11/2021  ? PHOS 4.3 08/15/2021  ? PROT 7.2 12/08/2021  ? ALBUMIN 2.6 (L) 12/08/2021  ? BILITOT 0.8 12/08/2021  ? ALKPHOS 119 12/08/2021  ? AST 28 12/08/2021  ? ALT 9 12/08/2021  ? ANIONGAP 8 12/11/2021  ? ? ?GFR: ?Estimated Creatinine Clearance: 50.8 mL/min (A) (by C-G formula based on SCr of 1.13 mg/dL (H)). ? ?Recent Results (from the past 240 hour(s))  ?Respiratory (~20 pathogens) panel by PCR     Status: None  ? Collection Time: 12/07/21 11:17 PM  ? Specimen:  Nasopharyngeal Swab; Respiratory  ?Result Value Ref Range Status  ? Adenovirus NOT DETECTED NOT DETECTED Final  ? Coronavirus 229E NOT DETECTED NOT DETECTED Final  ?  Comment: (NOTE) ?The Coronavirus on the Respiratory Panel, DOES NOT test for the novel  ?Coronavirus (2019 nCoV) ?  ? Coronavirus HKU1 NOT DETECTED NOT DETECTED Final  ? Coronavirus NL63 NOT DETECTED NOT DETECTED Final  ? Coronavirus OC43 NOT DETECTED NOT DETECTED Final  ? Metapneumovirus NOT DETECTED NOT DETECTED Final  ? Rhinovirus / Enterovirus NOT DETECTED NOT DETECTED Final  ? Influenza A NOT DETECTED NOT DETECTED Final  ? Influenza B NOT DETECTED NOT DETECTED Final  ? Parainfluenza Virus 1 NOT DETECTED NOT DETECTED Final  ? Parainfluenza Virus 2 NOT DETECTED NOT DETECTED Final  ? Parainfluenza Virus 3 NOT DETECTED NOT DETECTED Final  ? Parainfluenza Virus 4 NOT DETECTED NOT DETECTED Final  ? Respiratory Syncytial Virus NOT DETECTED NOT DETECTED Final  ? Bordetella pertussis NOT DETECTED NOT DETECTED Final  ? Bordetella Parapertussis NOT DETECTED NOT DETECTED Final  ? Chlamydophila pneumoniae NOT DETECTED NOT DETECTED Final  ? Mycoplasma pneumoniae NOT DETECTED NOT DETECTED Final  ?  Comment: Performed at [facility], [place] [address] [place], [place] [address]  ?  ? ? ?Radiology Studies: ?No results found. ? ? ? LOS: 3 days  ? ? ?Deidre Ala  Lonny Prude, MD ?[facility] ?12/11/2021, 2:37 PM ? ? ?If 7PM-7AM, please contact night-coverage ?[email or web address] ? ?

## 2021-12-11 NOTE — Progress Notes (Signed)
Initial Nutrition Assessment ? ?DOCUMENTATION CODES:  ? ?Obesity unspecified ? ?INTERVENTION:  ? ?- Ensure Max po BID, each supplement provides 150 kcal and 30 grams of protein ? ?- MVI with minerals daily ? ?- Encourage PO intake ? ?NUTRITION DIAGNOSIS:  ? ?Moderate Malnutrition related to chronic illness (COPD, CHF) as evidenced by mild muscle depletion, mild fat depletion, percent weight loss (16.5% weight loss in 9 months). ? ?GOAL:  ? ?Patient will meet greater than or equal to 90% of their needs ? ?MONITOR:  ? ?PO intake, Supplement acceptance, Labs, Weight trends, I & O's, Skin ? ?REASON FOR ASSESSMENT:  ? ?Malnutrition Screening Tool ?  ? ?ASSESSMENT:  ? ?66 year old female who presented to the ED on 4/23 with SOB. PMH of chronic respiratory failure, T2DM, COPD, asthma, CAD s/p stenting, CABG, PAD, HLD, OSA, s/p lap-band surgery, atrial fibrillation, CHF, GERD, ESRD s/p renal transplant, HTN, anemia, depression, anxiety, CVA. Pt admitted with COPD exacerbation, acute on chronic CHF, and NSTEMI. ? ?Spoke with pt briefly at bedside. Pt very lethargic and kept falling asleep during RD visit. Pt reports good appetite and good PO intake. Meal completion for breakfast this morning was 100%. Pt continued to fall back to sleep during RD visit. Pt consented to NFPE and was agreeable to oral nutrition supplementation. RD will also order daily MVI with minerals. ? ?RD will attempt to obtain further diet and weight history at follow-up. Reviewed weight history in chart. Weight has slowly decreased over the last 9 months. Pt with a 17.4 kg weight loss since 03/12/22. This is a 16.5% weight loss in 9 months which is severe and significant for timeframe. Difficult to determine how much of weight loss may be related to fluid status but do suspect that dry weight loss is present. Based on NFPE, pt meets criteria for moderate malnutrition. ? ?Admit weight: 85.9 kg ?Current weight: 88.3 kg ? ?Pt with mild pitting edema to  BLE. ? ?Meal Completion: 100% ? ?Medications reviewed and include: calcitriol, lasix 40 mg daily, SSI, semglee 20 units daily, nystatin mouthwash, zyprexa, protonix, klor-con 20 mEq BID, prednisone ? ?Labs reviewed: sodium 133, creatinine 1.13, WBC 12.5, hemoglobin 9.5 ?CBG's: 224-483 x  24 hours ? ?UOP: 1750 ml x 24 hours ?I/O's: -3.9 L since admit ? ?NUTRITION - FOCUSED PHYSICAL EXAM: ? ?Flowsheet Row Most Recent Value  ?Orbital Region Mild depletion  ?Upper Arm Region No depletion  ?Thoracic and Lumbar Region Mild depletion  ?Buccal Region Mild depletion  ?Temple Region Mild depletion  ?Clavicle Bone Region Mild depletion  ?Clavicle and Acromion Bone Region Mild depletion  ?Scapular Bone Region Mild depletion  ?Dorsal Hand No depletion  ?Patellar Region Mild depletion  ?Anterior Thigh Region Mild depletion  ?Posterior Calf Region Mild depletion  ?Edema (RD Assessment) Mild  [BLE]  ?Hair Reviewed  ?Eyes Reviewed  ?Mouth Reviewed  ?Skin Reviewed  ?Nails Reviewed  ? ?  ? ? ?Diet Order:   ?Diet Order   ? ?       ?  Diet heart healthy/carb modified Room service appropriate? Yes; Fluid consistency: Thin; Fluid restriction: 1800 mL Fluid  Diet effective now       ?  ? ?  ?  ? ?  ? ? ?EDUCATION NEEDS:  ? ?Not appropriate for education at this time ? ?Skin:  Skin Assessment: ?Skin Integrity Issues: ?Stage III: R heel (per WOC note) ? ?Last BM:  12/07/21 ? ?Height:  ? ?Ht Readings from Last 1 Encounters:  ?12/08/21  $'5\' 1"'S$  (1.549 m)  ? ? ?Weight:  ? ?Wt Readings from Last 1 Encounters:  ?12/11/21 88.3 kg  ? ? ?BMI:  Body mass index is 36.78 kg/m?. ? ?Estimated Nutritional Needs:  ? ?Kcal:  1800-2000 ? ?Protein:  85-100 grams ? ?Fluid:  1.8 L/day ? ? ? ?Gustavus Bryant, MS, RD, LDN ?Inpatient Clinical Dietitian ?Please see AMiON for contact information. ? ?

## 2021-12-11 NOTE — Care Management Important Message (Signed)
Important Message ? ?Patient Details  ?Name: Katherine [name] ?MRN: [id number] ?Date of Birth: Jul 06, 1956 ? ? ?Medicare Important Message Given:  Yes ? ? ? ? ?Alleyne Lac ?12/11/2021, 3:52 PM ?

## 2021-12-11 NOTE — Progress Notes (Signed)
? ?Progress Note ? ?Patient Name: Katherine [name] ?Date of Encounter: 12/11/2021 ? ?[facility] Cardiologist: [name], MD  ? ?Subjective  ? ?Feeling better.  Chest pain has resolved.  Breathing a little better today. ? ?Inpatient Medications  ?  ?Scheduled Meds: ? apixaban  5 mg Oral BID  ? arformoterol  15 mcg Nebulization BID  ? azaTHIOprine  125 mg Oral Daily  ? calcitRIOL  0.25 mcg Oral Q72H  ? clopidogrel  75 mg Oral Daily  ? furosemide  40 mg Oral Daily  ? gabapentin  100 mg Oral QHS  ? insulin aspart  0-20 Units Subcutaneous TID WC  ? insulin glargine-yfgn  20 Units Subcutaneous Daily  ? ipratropium-albuterol  3 mL Nebulization BID  ? isosorbide mononitrate  60 mg Oral Daily  ? lamoTRIgine  25 mg Oral QHS  ? metoprolol tartrate  25 mg Oral Q8H  ? montelukast  10 mg Oral QHS  ? nystatin  5 mL Oral QID  ? OLANZapine  5 mg Oral QHS  ? pantoprazole  40 mg Oral Daily  ? potassium chloride  20 mEq Oral BID  ? predniSONE  5 mg Oral Q breakfast  ? ranolazine  1,000 mg Oral BID  ? rosuvastatin  20 mg Oral Daily  ? sertraline  100 mg Oral Daily  ? sodium chloride flush  3 mL Intravenous Q12H  ? ?Continuous Infusions: ? ?PRN Meds: ?acetaminophen **OR** acetaminophen, albuterol, LORazepam, nitroGLYCERIN, oxyCODONE  ? ?Vital Signs  ?  ?Vitals:  ? 12/11/21 0434 12/11/21 0514 12/11/21 0626 12/11/21 0746  ?BP: (!) 139/92  123/71 100/62  ?Pulse: 87 75 77 99  ?Resp: '16 15  18  '$ ?Temp: 98 ?F (36.7 ?C)   98 ?F (36.7 ?C)  ?TempSrc: Oral   Oral  ?SpO2: 91% 96%  92%  ?Weight:  88.3 kg    ?Height:      ? ? ?Intake/Output Summary (Last 24 hours) at 12/11/2021 0816 ?Last data filed at 12/11/2021 0700 ?Gross per 24 hour  ?Intake 1298.91 ml  ?Output 1750 ml  ?Net -451.09 ml  ? ? ?  12/11/2021  ?  5:14 AM 12/10/2021  ?  4:31 AM 12/09/2021  ?  3:17 AM  ?Last 3 Weights  ?Weight (lbs) 194 lb 10.7 oz 204 lb 9.4 oz 190 lb 4.1 oz  ?Weight (kg) 88.3 kg 92.8 kg 86.3 kg  ?   ? ?Telemetry  ?  ?Atrial fibrillation, controlled ventricular rate  - Personally Reviewed ? ? ?Physical Exam  ?Alert, oriented, chronically ill-appearing, obese woman in NAD ?GEN: No acute distress.   ?Neck: No JVD ?Cardiac: RRR, no murmurs, rubs, or gallops.  ?Respiratory: Clear to auscultation bilaterally. ?GI: Soft, nontender, non-distended  ?MS: No edema; No deformity. ?Neuro:  Nonfocal  ?Psych: Normal affect  ? ?Labs  ?  ?High Sensitivity Troponin:   ?Recent Labs  ?Lab 12/07/21 ?2125 12/08/21 ?0019 12/08/21 ?0241 12/08/21 ?0838  ?TROPONINIHS 15 36* 969* 3,156*  ?   ?Chemistry ?Recent Labs  ?Lab 12/08/21 ?0019 12/08/21 ?0025 12/08/21 ?0241 12/09/21 ?2836 12/10/21 ?6294 12/11/21 ?0122  ?NA  --    < > 132* 132* 132* 133*  ?K  --    < > 4.8 3.4* 3.6 4.2  ?CL  --   --  98 94* 94* 96*  ?CO2  --   --  '23 28 29 29  '$ ?GLUCOSE  --   --  407* 358* 391* 169*  ?BUN  --   --  $'11 18 17 23  'y$ ?CREATININE  --   --  1.00 1.01* 1.12* 1.13*  ?CALCIUM  --   --  9.0 8.8* 9.1 9.3  ?MG 2.2  --   --   --   --   --   ?PROT  --   --  7.2  --   --   --   ?ALBUMIN  --   --  2.6*  --   --   --   ?AST  --   --  28  --   --   --   ?ALT  --   --  9  --   --   --   ?ALKPHOS  --   --  119  --   --   --   ?BILITOT  --   --  0.8  --   --   --   ?GFRNONAA  --   --  >60 >60 55* 54*  ?ANIONGAP  --   --  '11 10 9 8  '$ ? < > = values in this interval not displayed.  ?  ?Lipids No results for input(s): CHOL, TRIG, HDL, LABVLDL, LDLCALC, CHOLHDL in the last 168 hours.  ?Hematology ?Recent Labs  ?Lab 12/09/21 ?6761 12/10/21 ?9509 12/11/21 ?0122  ?WBC 10.6* 10.0 12.5*  ?RBC 3.60* 3.55* 3.55*  ?HGB 9.7* 9.6* 9.5*  ?HCT 31.5* 30.8* 31.0*  ?MCV 87.5 86.8 87.3  ?MCH 26.9 27.0 26.8  ?MCHC 30.8 31.2 30.6  ?RDW 19.6* 19.2* 18.8*  ?PLT 279 273 306  ? ?Thyroid No results for input(s): TSH, FREET4 in the last 168 hours.  ?BNP ?Recent Labs  ?Lab 12/07/21 ?2125  ?BNP 1,347.1*  ?  ?DDimer No results for input(s): DDIMER in the last 168 hours.  ? ?Radiology  ?  ?No results found. ? ? ?Patient Profile  ?   ?66 y.o. female multiple advanced  medical problems including coronary artery disease status post CABG with multiple PCI procedures, failed attempts at catheterization and PCI due to poor vascular access, oxygen dependent COPD, and acute on chronic combined systolic and diastolic heart failure. ? ?Assessment & Plan  ?  ?Acute on chronic combined systolic and diastolic heart failure: continue current Rx with oral lasix and metoprolol. Transitioned off of IV diuretic yesterday.  No ACE/ARB with tenuous clinical course and risk of AKI. ?NSTEMI: completed 48 hrs of IV heparin. Continue isosorbide, metoprolol, ranolazine, clopidogrel.  ?Persistent atrial fibrillation: HR well-controlled on metoprolol. Apixaban resumed for [facility].  ? ?Pt appears stable from a cardiac standpoint. Heart failure TOC visit arranged. Continues to have issues with hyperglycemia. Will sign off today as cardiac issues have stabilized. Please let me know if any questions or issues arise. Thanks ? ?[facility] will sign off.   ?Medication Recommendations: See above, no changes ?Other recommendations (labs, testing, etc): None ?Follow up as an outpatient: Heart failure TOC visit arranged ? ?For questions or updates, please contact [place] ?Please consult [email or web address] for contact info under  ? ?  ?   ?Signed, ?Sherren Mocha, MD  ?12/11/2021, 8:16 AM   ? ?

## 2021-12-11 NOTE — Evaluation (Signed)
Occupational Therapy Evaluation Patient Details Name: Katherine [name] MRN: [id number] DOB: 1955/12/21 Today's Date: 12/11/2021   History of Present Illness Pt is a 66 yo female presenting 12/07/21 with worsening SOB. Pt admitted with CHF, COPD, asthma exacerbations and uptrending troponins concerning for NSTEMI. Of note, has had multiple admissions this year already for NSTEMI's, pneumonia, CHF exacerbation. PMH: ESRD s/p renal transplant on chronic immunosuppression, CAD s/p CABG and multiple PCI most recently DES to SVG 14 February 2021, PAF on Eliquis, chronic diastolic CHF, COPD, chronic respiratory failure with hypoxia on 3 L supplemental O2 via [place] with exertion and sleep, insulin-dependent T2DM, HTN, HLD, depression/anxiety, chronic pain   Clinical Impression   PTA, pt was living with her brother who assistance with LB ADLs, IADLs, toilet hygiene (as needed), and occasionally mobility as needed - typically pt uses rollator. Pt currently requiring Mod-Max A for LB ADLs, Min Guard-Min A for standing ADLs, and Min A for functional mobility. Pt presenting with decreased activity tolerance, balance, and strength. Pt would benefit from further acute OT to facilitate safe dc. Recommend dc to home with HHOT for further OT to optimize safety, independence with ADLs, and return to PLOF.      Recommendations for follow up therapy are one component of a multi-disciplinary discharge planning process, led by the attending physician.  Recommendations may be updated based on patient status, additional functional criteria and insurance authorization.   Follow Up Recommendations  Home health OT    Assistance Recommended at Discharge Frequent or constant Supervision/Assistance  Patient can return home with the following      Functional Status Assessment  Patient has had a recent decline in their functional status and demonstrates the ability to make significant improvements in function in a reasonable and  predictable amount of time.  Equipment Recommendations  None recommended by OT    Recommendations for Other Services PT consult     Precautions / Restrictions Precautions Precautions: Fall Precaution Comments: Watch vitals      Mobility Bed Mobility Overal bed mobility: Needs Assistance Bed Mobility: Supine to Sit, Sit to Supine     Supine to sit: Supervision Sit to supine: Supervision   General bed mobility comments: increased time    Transfers Overall transfer level: Needs assistance Equipment used: None Transfers: Sit to/from Stand Sit to Stand: Min guard           General transfer comment: Min Guard A for safety      Balance Overall balance assessment: Needs assistance Sitting-balance support: No upper extremity supported, Feet supported Sitting balance-Leahy Scale: Good     Standing balance support: Single extremity supported, During functional activity Standing balance-Leahy Scale: Poor Standing balance comment: Flucuating between Min guard and Min A for static balance                           ADL either performed or assessed with clinical judgement   ADL Overall ADL's : Needs assistance/impaired Eating/Feeding: Set up;Bed level   Grooming: Oral care;Wash/dry face;Min guard;Minimal assistance;Standing Grooming Details (indicate cue type and reason): Min guard-Min A for standing balance Upper Body Bathing: Supervision/ safety;Set up;Sitting   Lower Body Bathing: Moderate assistance;Sit to/from stand   Upper Body Dressing : Set up;Supervision/safety;Sitting   Lower Body Dressing: Maximal assistance;Sit to/from stand Lower Body Dressing Details (indicate cue type and reason): donning socks Toilet Transfer: Min guard;Ambulation;Regular Toilet;Grab bars   Toileting- Clothing Manipulation and Hygiene: Supervision/safety;Sitting/lateral lean  Functional mobility during ADLs: Minimal assistance (single hand held) General ADL  Comments: Pt performing toileting and then grooming at sink. Requiring increased assistance for LB ADLs but this is baseline. Demonstrating decreased balance and activity tolerance     Vision Baseline Vision/History: 1 Wears glasses       Perception     Praxis      Pertinent Vitals/Pain Pain Assessment Pain Assessment: 0-10 Pain Score: 4  Pain Location: generalized with mobility Pain Descriptors / Indicators: Discomfort, Grimacing Pain Intervention(s): Monitored during session, Limited activity within patient's tolerance, Repositioned     Hand Dominance Right   Extremity/Trunk Assessment Upper Extremity Assessment Upper Extremity Assessment: Generalized weakness   Lower Extremity Assessment Lower Extremity Assessment: Generalized weakness   Cervical / Trunk Assessment Cervical / Trunk Assessment: Kyphotic   Communication Communication Communication: No difficulties   Cognition Arousal/Alertness: Awake/alert Behavior During Therapy: WFL for tasks assessed/performed Overall Cognitive Status: Within Functional Limits for tasks assessed                                       General Comments  SpO2 90s on 5L.    Exercises     Shoulder Instructions      Home Living Family/patient expects to be discharged to:: Private residence Living Arrangements: Other relatives (Brother) Available Help at Discharge: Family;Available 24 hours/day Type of Home: House Home Access: Stairs to enter [employer] of Steps: 5 Entrance Stairs-Rails: Left Home Layout: One level     Bathroom Shower/Tub: Chief Strategy Officer: Standard     Home Equipment: Rollator (4 wheels);Cane - single point;Tub bench;Grab bars - toilet;Wheelchair - manual;Hospital bed   Additional Comments: 5L O2 at baseline.      Prior Functioning/Environment Prior Level of Function : Needs assist       Physical Assist : Mobility (physical);ADLs (physical)      Mobility Comments: rollator for very short household distances. Brother helps as needed. Use of transportation for assistance to/from appointments and to assist with in and out of home as she doesnt use the steps ADLs Comments: Brother assists as needed for ADLs. Home hospice as well. Retired Psychologist, prison and probation services Problem List: Decreased strength;Decreased range of motion;Decreased activity tolerance;Impaired balance (sitting and/or standing);Decreased knowledge of precautions;Decreased knowledge of use of DME or AE      OT Treatment/Interventions: Self-care/ADL training;Therapeutic exercise;Energy conservation;DME and/or AE instruction;Therapeutic activities;Patient/family education    OT Goals(Current goals can be found in the care plan section) Acute Rehab OT Goals Patient Stated Goal: Go home OT Goal Formulation: With patient Time For Goal Achievement: 12/25/21 Potential to Achieve Goals: Good  OT Frequency: Min 2X/week    Co-evaluation              AM-PAC OT "6 Clicks" Daily Activity     Outcome Measure Help from another person eating meals?: None Help from another person taking care of personal grooming?: A Little Help from another person toileting, which includes using toliet, bedpan, or urinal?: A Little Help from another person bathing (including washing, rinsing, drying)?: A Lot Help from another person to put on and taking off regular upper body clothing?: A Little Help from another person to put on and taking off regular lower body clothing?: A Lot 6 Click Score: 17   End of Session Equipment Utilized During Treatment: Oxygen Nurse Communication: Mobility status  Activity Tolerance: Patient tolerated treatment well Patient left: in bed;with call bell/phone within reach;with bed alarm set  OT Visit Diagnosis: Unsteadiness on feet (R26.81);Other abnormalities of gait and mobility (R26.89);Muscle weakness (generalized) (M62.81)                Time:  4332-9518 OT Time Calculation (min): 26 min Charges:  OT General Charges $OT Visit: 1 Visit OT Evaluation $OT Eval Moderate Complexity: 1 Mod  Kiona Blume MSOT, OTR/L Acute Rehab Pager: [phone] Office: [phone]  Theodoro Grist Raphael Fitzpatrick 12/11/2021, 10:03 AM

## 2021-12-12 ENCOUNTER — Other Ambulatory Visit (HOSPITAL_COMMUNITY): Payer: Self-pay

## 2021-12-12 DIAGNOSIS — J9621 Acute and chronic respiratory failure with hypoxia: Secondary | ICD-10-CM | POA: Diagnosis not present

## 2021-12-12 DIAGNOSIS — I5032 Chronic diastolic (congestive) heart failure: Secondary | ICD-10-CM | POA: Diagnosis not present

## 2021-12-12 DIAGNOSIS — F419 Anxiety disorder, unspecified: Secondary | ICD-10-CM | POA: Diagnosis not present

## 2021-12-12 DIAGNOSIS — J9611 Chronic respiratory failure with hypoxia: Secondary | ICD-10-CM

## 2021-12-12 LAB — GLUCOSE, CAPILLARY
Glucose-Capillary: 274 mg/dL — ABNORMAL HIGH (ref 70–99)
Glucose-Capillary: 252 mg/dL — ABNORMAL HIGH (ref 70–99)
Glucose-Capillary: 336 mg/dL — ABNORMAL HIGH (ref 70–99)
Glucose-Capillary: 202 mg/dL — ABNORMAL HIGH (ref 70–99)

## 2021-12-12 LAB — MRSA NEXT GEN BY PCR, NASAL: MRSA by PCR Next Gen: NOT DETECTED

## 2021-12-12 MED ORDER — ADULT MULTIVITAMIN W/MINERALS CH: 1.0000 | ORAL_TABLET | Freq: Every day | ORAL | Status: DC

## 2021-12-12 MED ORDER — OXYCODONE HCL 10 MG PO TABS: 10.0000 mg | ORAL_TABLET | Freq: Four times a day (QID) | ORAL | Status: DC | PRN

## 2021-12-12 MED ORDER — METOPROLOL TARTRATE 25 MG PO TABS
25.0000 mg | ORAL_TABLET | Freq: Three times a day (TID) | ORAL | 2 refills | Status: DC
  Filled 2021-12-12: qty 90, 30d supply, fill #0

## 2021-12-12 MED ORDER — ENSURE MAX PROTEIN PO LIQD: 11.0000 [oz_av] | Freq: Two times a day (BID) | ORAL | Status: DC

## 2021-12-12 MED ORDER — RANOLAZINE ER 1000 MG PO TB12
1000.0000 mg | ORAL_TABLET | Freq: Two times a day (BID) | ORAL | 0 refills | Status: DC
Start: 2021-12-12 — End: 2021-12-26
  Filled 2021-12-12: qty 60, 30d supply, fill #0

## 2021-12-12 NOTE — Discharge Summary (Signed)
?Physician Discharge Summary ?  ?Patient: Katherine [name] MRN: [id number] DOB: 1956/04/07  ?Admit date:     12/07/2021  ?Discharge date: 12/12/21  ?Discharge Physician: Cordelia Poche, MD  ? ?PCP: [name], MD  ? ?Recommendations at discharge:  ? ?PCP and cardiology follow-up ? ?Discharge Diagnoses: ?Principal Problem: ?  Acute on chronic respiratory failure with hypoxia (HCC) ?Active Problems: ?  CAD S/P percutaneous coronary angioplasty - PCI x 5 to SVG-D1 ?  Extrinsic asthma ?  GERD ?  Morbid obesity - s/p Lap Band 9/'05 ?  Hx of CABG ?  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  Dyslipidemia, goal LDL below 70 ?  Essential hypertension ?  PAD (peripheral artery disease) ([place]) ?  Paroxysmal atrial fibrillation ([place]); CHA2DS2VASc score F, HTN, CAD, CVA = 5 ?  Type 2 diabetes mellitus with hyperlipidemia ([place]) ?  ESRD (end stage renal disease) ([place]) ?  OSA (obstructive sleep apnea) ?  Anxiety and depression ?  Chronic respiratory failure with hypoxia (HCC) ?  COPD (chronic obstructive pulmonary disease) ([place]) ?  History of renal transplant ?  Chronic diastolic CHF (congestive heart failure) ([place]) ?  CHF exacerbation ([place]) ?  COPD with acute exacerbation ([place]) ? ?Resolved Problems: ?  * No resolved hospital problems. * ? ?Hospital Course: ? ?Katherine [name] is a 66 y.o. female with medical history significant of chronic respiratory failure, COPD, asthma, CAD status post stenting, CABG and in grafts stenting, PAD, carotid artery disease, hyperlipidemia, OSA, obesity status post lap band, paroxysmal A-fib, polymorphic [name], CHF, GERD, ESRD status post renal transplant, hypertension, anemia, depression, anxiety, CVA presenting with worsening shortness of breath. ?Patient reports 2 days of progressive shortness of breath.  She is on 5 L chronically at home and has had to increase fluids over the past 2 days and has remained short of breath.  She was requiring up to 10 L oxygen upon admission, as  well her work-up was noted for significant trend up and troponins with concern of  NSTEMI's, so she was admitted for further work up. ? ?Assessment and Plan: ? ?Acute on chronic respiratory failure with hypoxia, resolved. ?Secondary to COPD exacerbation and likely also secondary to acute heart failure. Baseline oxygen use of 5 L/min via [place]. Patient requiring up to 6 L/min with associated dyspnea and hypoxia into 70s on home oxygen. Now back to baseline. ?  ?COPD exacerbation ?Patient treated with IV steroids and duonebs with improvement. Transitioned to prednisone PO. Continue home inhaler regimen. Steroid burst completed. ?  ?Acute on chronic combined systolic and diastolic heart failure ?LVEF of 45-50% with grade III diastolic dysfunction on Transthoracic Echocardiogram from 4/24. Continue home Lasix and metoprolol. ?  ?Non-STEMI ?Patient with chest pain and associated troponin rise of 36 > 969 > 3,156. Recommendation for no cardiac catheterization at this time secondary to no vascular access. Cardiology recommending medical management. She was managed on Heparin IV for 48 hours. Ranolazine increased to 1000 mg BID. Continuing metoprolol and isosorbide mononitrate. ?  ?Diabetes mellitus, type 2 ?Hemoglobin A1C of 7.8% from 10/20/21. Blood sugar significantly uncontrolled, likely in part to steroid dosing. Semglee increased from 15 units to 20 units daily. Previous BMP without development of acidosis. Improved blood sugar control with reduction in steroids. Resume home regimen. ?  ?Sore throat ?Associated sneezing/cough. No palpable lymph nodes. No evidence of thrush on exam. Possible viral illness. RVP negative. Influenza and COVID-19 negative. Symptomatic management. Outpatient follow-up for resolution. ?  ?  CAD ?PAD ?Carotid artery disease ?Continue Crestor ?  ?Hyperlipidemia ?Continue Crestor ?  ?Primary hypertension ?Continue metoprolol and isosorbide mononitrate ?  ?Persistent atrial fibrillation ?Rate  controlled ?Continue Eliquis and metoprolol ?  ?Chronic pain ?Continue oxycodone PRN ?  ?GERD ?Continue Protonix ?  ?History of ESRD s/p renal transplant ?Patient is on prednisone and azathioprine as an outpatient. Continue azathioprine and prednisone 5 mg daily ?  ?Anemia ?Chronic. Baseline appears to be 10-11. Slight drift down to 9.6 today. No evidence of bleeding. Possibly related to chronic disease in setting of renal disease.  ?  ?Anxiety ?Depression ?Continue Zyprexa, Zoloft, Ativan PRN ?  ?History of CVA ?Continue Plavix and Crestor ?  ?Morbid obesity ?S/p lap band ?  ?Right heel pressure injury ?Present on admission.  ? ? ?  ? ? ?Consultants: Cardiology ?Procedures performed: Transthoracic Echocardiogram   ?Disposition: Home health ?Diet recommendation:  ?Cardiac and Carb modified diet ? ?DISCHARGE MEDICATION: ?Allergies as of 12/12/2021   ? ?   Reactions  ? Tetracycline Hives  ? Patient tolerated Doxycycline Dec 2020  ? Niacin Other (See Comments)  ? Mouth blisters  ? Niaspan [niacin Er] Other (See Comments)  ? Mouth blisters  ? Sulfa Antibiotics Nausea Only, Other (See Comments)  ? "Tears up stomach"  ? Sulfonamide Derivatives Other (See Comments)  ? Reaction: per patient "tears her stomach up"  ? Codeine Nausea And Vomiting  ? Erythromycin Nausea And Vomiting  ? Hydromorphone Hcl Nausea And Vomiting  ? Morphine And Related Nausea And Vomiting  ? Nalbuphine Nausea And Vomiting  ? Nubain  ? Sulfasalazine Nausea Only, Other (See Comments)  ? per patient "tears her stomach up", "Tears up stomach"  ? Tape Rash, Other (See Comments)  ? No "plastic" tape," please----cloth tape only  ? ?  ? ?  ?Medication List  ?  ? ?TAKE these medications   ? ?albuterol 108 (90 Base) MCG/ACT inhaler ?Commonly known as: VENTOLIN HFA ?TAKE 2 PUFFS BY MOUTH EVERY 6 HOURS AS NEEDED FOR WHEEZE OR SHORTNESS OF BREATH ?What changed: See the new instructions. ?  ?apixaban 5 MG Tabs tablet ?Commonly known as: ELIQUIS ?Take 1 tablet (5 mg  total) by mouth 2 (two) times daily. ?  ?arformoterol 15 MCG/2ML Nebu ?Commonly known as: BROVANA ?Take 2 mLs (15 mcg total) by nebulization 2 (two) times daily. ?  ?azaTHIOprine 50 MG tablet ?Commonly known as: IMURAN ?Take 2 & 1/2 tablets (125 mg) by mouth daily at 3pm ?What changed: when to take this ?  ?Basaglar KwikPen 100 UNIT/ML ?Inject 20 Units into the skin daily. ?  ?bisacodyl 5 MG EC tablet ?Commonly known as: DULCOLAX ?Take 1 tablet (5 mg total) by mouth daily as needed for moderate constipation. ?  ?budesonide 0.5 MG/2ML nebulizer solution ?Commonly known as: PULMICORT ?Take 2 mLs (0.5 mg total) by nebulization 2 (two) times daily. ?What changed:  ?when to take this ?reasons to take this ?  ?calcitRIOL 0.25 MCG capsule ?Commonly known as: ROCALTROL ?Take 0.25 mcg by mouth See admin instructions. Takes every 3 days. ?  ?clopidogrel 75 MG tablet ?Commonly known as: PLAVIX ?Take 75 mg by mouth daily. ?  ?cyclobenzaprine 10 MG tablet ?Commonly known as: FLEXERIL ?Take 1 tablet (10 mg total) by mouth 3 (three) times daily as needed for muscle spasms. ?What changed: when to take this ?  ?Ensure Max Protein Liqd ?Take 330 mLs (11 oz total) by mouth 2 (two) times daily. ?  ?fluticasone 50 MCG/ACT nasal spray ?  Commonly known as: FLONASE ?PLACE 2 SPRAYS INTO BOTH NOSTRILS 2 TIMES DAILY. ?What changed: See the new instructions. ?  ?furosemide 40 MG tablet ?Commonly known as: LASIX ?Take 40 mg by mouth daily. ?  ?gabapentin 100 MG capsule ?Commonly known as: NEURONTIN ?Take 1 capsule (100 mg total) by mouth at bedtime. ?  ?glimepiride 4 MG tablet ?Commonly known as: AMARYL ?Take 4 mg by mouth daily with breakfast. ?  ?isosorbide mononitrate 60 MG 24 hr tablet ?Commonly known as: IMDUR ?Take 1 tablet (60 mg total) by mouth daily. ?  ?lamoTRIgine 25 MG tablet ?Commonly known as: LAMICTAL ?TAKE 1 TABLET BY MOUTH EVERYDAY AT BEDTIME ?What changed: See the new instructions. ?  ?LORazepam 2 MG/ML concentrated  solution ?Commonly known as: ATIVAN ?Place 0.5 mLs (1 mg total) under the tongue every 8 (eight) hours as needed for anxiety, seizure, sedation or sleep (distress). ?  ?metFORMIN 500 MG tablet ?Commonly known as: GLUCOP

## 2021-12-12 NOTE — TOC Initial Note (Addendum)
Transition of Care [facility]) - Initial/Assessment Note    Patient Details  Name: Katherine [name] MRN: [id number] Date of Birth: [birth year]  Transition of Care [facility]) CM/SW Contact:    Beckie Busing, RN Phone Number:[phone]  12/12/2021, 10:20 AM  Clinical Narrative:                 CM at bedside for initial assessment for patient that is high risk for readmission and home health needs. Patient states that she is from home where she lives with her brother. Patient states that she normally functions independently with the use of wheelchair for transport. PCP Dr[facility] [place]. Patient reports that she does have transportation to MD visits and to pick up medications but is in the process of getting a ramp at her home. CM offered patient choice for Home health services medicare.gov list provided. Patient states that she has [facility] had [facility] and would  like to resume services. [place] with [facility] has accepted referral and has verified that patient is not on Hospice services. MD entered consult to determine is patient is eligible for home CPAP. Referral has been called to [facility] with [facility]. Jermaine to review chart to determine eligibility.  1321 CPAP order placed on chart for signature. MD has been made aware. Please follow up with Jermaine at [facility] [phone] once order has been signed.   Expected Discharge Plan: Home w Home Health Services Barriers to Discharge: Continued Medical Work up   Patient Goals and CMS Choice Patient states their goals for this hospitalization and ongoing recovery are:: Ready to go home CMS Medicare.gov Compare Post Acute Care list provided to:: Patient Choice offered to / list presented to : Patient  Expected Discharge Plan and Services Expected Discharge Plan: Home w Home Health Services In-house Referral: NA Discharge Planning Services: CM Consult Post Acute Care Choice: Home Health Living arrangements for  the past 2 months: Single Family Home                 DME Arranged: N/A DME Agency: NA       HH Arranged: PT, OT HH Agency: [facility] Date HH Agency Contacted: 12/12/21 Time HH Agency Contacted: 1017 Representative spoke with at [facility] Agency: Gregary Signs  Prior Living Arrangements/Services Living arrangements for the past 2 months: Single Family Home Lives with:: Siblings (Brother) Patient language and need for interpreter reviewed:: Yes Do you feel safe going back to the place where you live?: Yes      Need for Family Participation in Patient Care: No (Comment)   Current home services: DME (wheelchair, cane, walker O2) Criminal Activity/Legal Involvement Pertinent to Current Situation/Hospitalization: No - Comment as needed  Activities of Daily Living Home Assistive Devices/Equipment: Bedside commode/3-in-1, Dentures (specify type), Eyeglasses, Shower chair with back, Oxygen ADL Screening (condition at time of admission) Patient's cognitive ability adequate to safely complete daily activities?: Yes Is the patient deaf or have difficulty hearing?: No Does the patient have difficulty seeing, even when wearing glasses/contacts?: No Does the patient have difficulty concentrating, remembering, or making decisions?: Yes Patient able to express need for assistance with ADLs?: Yes Does the patient have difficulty dressing or bathing?: Yes Independently performs ADLs?: No Does the patient have difficulty walking or climbing stairs?: Yes Weakness of Legs: Both Weakness of Arms/Hands: Both  Permission Sought/Granted   Permission granted to share information with : No  Emotional Assessment Appearance:: Appears older than stated age Attitude/Demeanor/Rapport: Gracious Affect (typically observed): Accepting Orientation: : Oriented to Self, Oriented to Place, Oriented to  Time, Oriented to Situation Alcohol / Substance Use: Not Applicable Psych  Involvement: No (comment)  Admission diagnosis:  Shortness of breath [R06.02] Acute on chronic respiratory failure with hypoxia (HCC) [J96.21] Chest pain, unspecified type [R07.9] Patient Active Problem List   Diagnosis Date Noted   COPD with acute exacerbation (HCC) 12/07/2021   Acute pain of right shoulder    Cellulitis of right buttock 10/20/2021   CHF exacerbation (HCC) 10/18/2021   Atrial fibrillation with rapid ventricular response (HCC) 10/18/2021   ACS (acute coronary syndrome) (HCC) 10/03/2021   Goals of care, counseling/discussion 10/03/2021   Acute on chronic diastolic CHF (congestive heart failure) (HCC) 09/25/2021   Class 2 obesity 09/25/2021   E-coli UTI 09/22/2021   questionable pneumonia on CXR 09/20/2021   Dysuria 09/20/2021   Rest pain of lower extremity due to atherosclerosis (HCC) 09/02/2021   Longstanding persistent atrial fibrillation (HCC)    MRSA pneumonia (HCC) 08/09/2021   Chronic diastolic CHF (congestive heart failure) (HCC) 08/06/2021   Obesity (BMI 30-39.9) 08/05/2021   Acute on chronic respiratory failure with hypoxia (HCC) 08/04/2021   Pressure injury of skin 08/04/2021   Polymorphic ventricular tachycardia    Bradycardia    COPD (chronic obstructive pulmonary disease) (HCC)    History of renal transplant    Nausea & vomiting 04/19/2021   Coronary stent restenosis due to scar tissue    Acute on chronic systolic CHF (congestive heart failure) (HCC) 01/29/2021   Non-ST elevation (NSTEMI) myocardial infarction [facility])    Chronic respiratory failure with hypoxia (HCC) 01/01/2021   Need for pneumococcal vaccination 08/13/2020   Candidal skin infection 08/13/2020   Anxiety and depression 08/13/2020   Chronic bronchitis, mucopurulent (HCC) 08/24/2019   Current chronic use of systemic steroids 07/23/2019   OSA (obstructive sleep apnea)    Severe episode of recurrent major depressive disorder, with psychotic features (HCC) 02/16/2019   Obstructive  chronic bronchitis without exacerbation (HCC) 11/03/2018   Long term (current) use of antithrombotics/antiplatelets 04/28/2018   Squamous cell carcinoma of skin of right thumb 02/02/2018   LBBB (left bundle branch block) 10/07/2017   Primary osteoarthritis of right knee 08/18/2017   Ovarian cyst 05/18/2017   Pelvic mass in female 03/11/2017   Mild aortic stenosis by prior echocardiogram 01/28/2017   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   Deficiency anemia 10/14/2016   Normocytic anemia 10/13/2016   Fibromyalgia 03/30/2016   Other spondylosis with radiculopathy, lumbar region 03/30/2016   Type 2 diabetes mellitus with hyperlipidemia (HCC) 03/30/2016   Paroxysmal atrial fibrillation (HCC); CHA2DS2VASc score F, HTN, CAD, CVA = 5 06/28/2014   Debility 06/28/2014   Fluttering heart 06/21/2014   CAD S/P percutaneous coronary angioplasty - PCI x 5 to SVG-D1 09/11/2013    Class: Diagnosis of   Essential hypertension    NSTEMI (non-ST elevated myocardial infarction) with CAD S/P percutaneous coronary angioplasty - PCI x 5 to SVG-D1 08/23/2013   PAD (peripheral artery disease) (HCC) 08/17/2013   CAD (coronary artery disease) of bypass graft 10/08/2012   Stenosis of right carotid artery without infarction 10/08/2012   Dyslipidemia, goal LDL below 70 10/08/2012    Class: Diagnosis of   Mitral annular calcification 10/08/2012   Hypomagnesemia 06/13/2012   Hypokalemia and hypomagnesia  06/12/2012   Renal transplant disorder 06/12/2012   Chronic allergic rhinitis 04/29/2011   Extrinsic asthma 09/09/2007   GERD  09/09/2007   COUGH, CHRONIC 09/09/2007   Morbid obesity - s/p Lap Band 9/'05 05/07/2004    Class: Diagnosis of   Hx of CABG 09/07/1993    Class: History of   ESRD (end stage renal disease) (HCC) 1991   PCP:  [name], MD Pharmacy:   [id number] [place], [place] - [address] [place] [address] Phone: [phone] Fax: [phone]     Social  Determinants of Health (SDOH) Interventions Food Insecurity Interventions: Intervention Not Indicated Housing Interventions: Intervention Not Indicated Transportation Interventions: Intervention Not Indicated  Readmission Risk Interventions    12/12/2021   10:01 AM 09/23/2021    4:39 PM 09/08/2021    4:00 PM  Readmission Risk Prevention Plan  Transportation Screening Complete Complete Complete  Medication Review Oceanographer) Complete Complete Complete  PCP or Specialist appointment within 3-5 days of discharge Complete Complete Complete  HRI or Home Care Consult Complete Complete Complete  SW Recovery Care/Counseling Consult Complete Complete Complete  Palliative Care Screening Not Applicable Complete Not Applicable  Skilled Nursing Facility Not Applicable Not Applicable Not Applicable

## 2021-12-12 NOTE — Discharge Instructions (Signed)
Katherine [name], ? ?You were in the hospital with trouble breathing from your COPD and heart failure. You were also found to have had a heart attack. You have improved with medications. The cardiologist has recommended for you in increase to Ranexa 1000 mg twice a day. Please follow-up with your PCP and cardiologist. ?

## 2021-12-12 NOTE — Plan of Care (Signed)
  Problem: Education: Goal: Ability to demonstrate management of disease process will improve Outcome: Progressing Goal: Ability to verbalize understanding of medication therapies will improve Outcome: Progressing Goal: Individualized Educational Video(s) Outcome: Progressing   Problem: Activity: Goal: Capacity to carry out activities will improve Outcome: Progressing   Problem: Cardiac: Goal: Ability to achieve and maintain adequate cardiopulmonary perfusion will improve Outcome: Progressing   Problem: Education: Goal: Knowledge of disease or condition will improve Outcome: Progressing Goal: Knowledge of the prescribed therapeutic regimen will improve Outcome: Progressing Goal: Individualized Educational Video(s) Outcome: Progressing   Problem: Activity: Goal: Ability to tolerate increased activity will improve Outcome: Progressing Goal: Will verbalize the importance of balancing activity with adequate rest periods Outcome: Progressing   Problem: Respiratory: Goal: Ability to maintain a clear airway will improve Outcome: Progressing Goal: Levels of oxygenation will improve Outcome: Progressing Goal: Ability to maintain adequate ventilation will improve Outcome: Progressing   Problem: Education: Goal: Knowledge of General Education information will improve Description Including pain rating scale, medication(s)/side effects and non-pharmacologic comfort measures Outcome: Progressing   Problem: Health Behavior/Discharge Planning: Goal: Ability to manage health-related needs will improve Outcome: Progressing   Problem: Clinical Measurements: Goal: Ability to maintain clinical measurements within normal limits will improve Outcome: Progressing Goal: Will remain free from infection Outcome: Progressing Goal: Diagnostic test results will improve Outcome: Progressing Goal: Respiratory complications will improve Outcome: Progressing Goal: Cardiovascular complication  will be avoided Outcome: Progressing   Problem: Activity: Goal: Risk for activity intolerance will decrease Outcome: Progressing   Problem: Nutrition: Goal: Adequate nutrition will be maintained Outcome: Progressing   Problem: Coping: Goal: Level of anxiety will decrease Outcome: Progressing   Problem: Elimination: Goal: Will not experience complications related to bowel motility Outcome: Progressing Goal: Will not experience complications related to urinary retention Outcome: Progressing   Problem: Pain Managment: Goal: General experience of comfort will improve Outcome: Progressing   Problem: Safety: Goal: Ability to remain free from injury will improve Outcome: Progressing   Problem: Skin Integrity: Goal: Risk for impaired skin integrity will decrease Outcome: Progressing   

## 2021-12-12 NOTE — Progress Notes (Signed)
Nursing page that patient developed some chest tightness and dyspnea. Symptoms are improving. EKG obtained and unchanged from previous. Patient with known CAD and recent NSTEMI managed medically. No role for intervention per cardiology secondary to lack of vascular access. Nitro as needed. If pain not subsiding, may need to stay. If improved, can discharge. ? ?Cordelia Poche, MD ?[facility] ?12/12/2021, 4:51 PM ?

## 2021-12-12 NOTE — Plan of Care (Signed)
?  Problem: Education: Goal: Ability to demonstrate management of disease process will improve Outcome: Adequate for Discharge Goal: Ability to verbalize understanding of medication therapies will improve Outcome: Adequate for Discharge Goal: Individualized Educational Video(s) Outcome: Adequate for Discharge   Problem: Activity: Goal: Capacity to carry out activities will improve Outcome: Adequate for Discharge   Problem: Cardiac: Goal: Ability to achieve and maintain adequate cardiopulmonary perfusion will improve Outcome: Adequate for Discharge   Problem: Education: Goal: Knowledge of disease or condition will improve Outcome: Adequate for Discharge Goal: Knowledge of the prescribed therapeutic regimen will improve Outcome: Adequate for Discharge Goal: Individualized Educational Video(s) Outcome: Adequate for Discharge   Problem: Activity: Goal: Ability to tolerate increased activity will improve Outcome: Adequate for Discharge Goal: Will verbalize the importance of balancing activity with adequate rest periods Outcome: Adequate for Discharge   Problem: Respiratory: Goal: Ability to maintain a clear airway will improve Outcome: Adequate for Discharge Goal: Levels of oxygenation will improve Outcome: Adequate for Discharge Goal: Ability to maintain adequate ventilation will improve Outcome: Adequate for Discharge   Problem: Education: Goal: Knowledge of General Education information will improve Description: Including pain rating scale, medication(s)/side effects and non-pharmacologic comfort measures Outcome: Adequate for Discharge   Problem: Health Behavior/Discharge Planning: Goal: Ability to manage health-related needs will improve Outcome: Adequate for Discharge   Problem: Clinical Measurements: Goal: Ability to maintain clinical measurements within normal limits will improve Outcome: Adequate for Discharge Goal: Will remain free from infection Outcome:  Adequate for Discharge Goal: Diagnostic test results will improve Outcome: Adequate for Discharge Goal: Respiratory complications will improve Outcome: Adequate for Discharge Goal: Cardiovascular complication will be avoided Outcome: Adequate for Discharge   Problem: Activity: Goal: Risk for activity intolerance will decrease Outcome: Adequate for Discharge   Problem: Nutrition: Goal: Adequate nutrition will be maintained Outcome: Adequate for Discharge   Problem: Coping: Goal: Level of anxiety will decrease Outcome: Adequate for Discharge   Problem: Elimination: Goal: Will not experience complications related to bowel motility Outcome: Adequate for Discharge Goal: Will not experience complications related to urinary retention Outcome: Adequate for Discharge   Problem: Pain Managment: Goal: General experience of comfort will improve Outcome: Adequate for Discharge   Problem: Safety: Goal: Ability to remain free from injury will improve Outcome: Adequate for Discharge   Problem: Skin Integrity: Goal: Risk for impaired skin integrity will decrease Outcome: Adequate for Discharge   

## 2021-12-12 NOTE — Progress Notes (Signed)
PT Cancellation Note ? ?Patient Details ?Name: Katherine [name] ?MRN: [id number] ?DOB: Oct 18, 1955 ? ? ?Cancelled Treatment:    Reason Eval/Treat Not Completed: Patient declined, no reason specified. Pt politely declining, reporting she was about to take a nap as she is very fatigued/lethargic right now. Will follow-up later as time permits. ? ? ?Moishe Spice, PT, DPT ?Acute Rehabilitation Services  ?Pager: [phone] ?Office: [phone] ? ? ? ?[name] ?12/12/2021, 1:33 PM ? ? ?

## 2021-12-12 NOTE — TOC Transition Note (Signed)
Transition of Care (TOC) - CM/SW Discharge Note ? ? ?Patient Details  ?Name: Katherine [name] ?MRN: [id number] ?Date of Birth: January 30, 1956 ? ?Transition of Care (TOC) CM/SW Contact:  ?Reece Agar, LCSWA ?Phone Number: ?12/12/2021, 3:34 PM ? ? ?Clinical Narrative:    ?Patient will DC to: Home  ?Anticipated DC date: 12/12/2021 ?Family notified: Pt niece ?Transport by: Corey Harold ? ? ?Per MD patient ready for DC to Home. RN, patient and patient's family, notified of DC. DC packet on chart. Ambulance transport requested for patient.  ? ?CSW will sign off for now as social work intervention is no longer needed. Please consult [place] again if new needs arise. ?  ? ? ?Final next level of care: [place] ?Barriers to Discharge: Continued Medical Work up ? ? ?Patient Goals and CMS Choice ?Patient states their goals for this hospitalization and ongoing recovery are:: Ready to go home ?CMS Medicare.gov Compare Post Acute Care list provided to:: Patient ?Choice offered to / list presented to : Patient ? ?Discharge Placement ?  ?           ?  ?  ?  ?  ? ?Discharge Plan and Services ?In-house Referral: NA ?Discharge Planning Services: CM Consult ?Post Acute Care Choice: Home Health          ?DME Arranged: N/A ?DME Agency: NA ?  ?  ?  ?HH Arranged: PT, OT ?[place] Agency: [facility] ([place]) ?Date HH Agency Contacted: 12/12/21 ?Time [place]: 1017 ?Representative spoke with at [place]: Chales Salmon ? ?Social Determinants of Health (SDOH) Interventions ?Food Insecurity Interventions: Intervention Not Indicated ?Housing Interventions: Intervention Not Indicated ?Transportation Interventions: Intervention Not Indicated ? ? ?Readmission Risk Interventions ? ?  12/12/2021  ? 10:01 AM 09/23/2021  ?  4:39 PM 09/08/2021  ?  4:00 PM  ?Readmission Risk Prevention Plan  ?Transportation Screening Complete Complete Complete  ?Medication Review Press photographer) Complete Complete Complete  ?PCP or Specialist appointment  within 3-5 days of discharge Complete Complete Complete  ?[place] or Home Care Consult Complete Complete Complete  ?SW Recovery Care/Counseling Consult Complete Complete Complete  ?Palliative Care Screening Not Applicable Complete Not Applicable  ?[place] Not Applicable Not Applicable Not Applicable  ? ? ? ? ? ?

## 2021-12-12 NOTE — Progress Notes (Signed)
Inpatient Diabetes Program Recommendations ? ?AACE/ADA: New Consensus Statement on Inpatient Glycemic Control (2015) ? ?Target Ranges:  Prepandial:   less than 140 mg/dL ?     Peak postprandial:   less than 180 mg/dL (1-2 hours) ?     Critically ill patients:  140 - 180 mg/dL  ? ?Lab Results  ?Component Value Date  ? GLUCAP 274 (H) 12/12/2021  ? HGBA1C 7.8 (H) 10/20/2021  ? ? ?Review of Glycemic Control ? Latest Reference Range & Units 12/11/21 11:17 12/11/21 16:36 12/11/21 21:32 12/12/21 06:24  ?Glucose-Capillary 70 - 99 mg/dL 313 (H) 378 (H) 255 (H) 274 (H)  ?(H): Data is abnormally high ?Diabetes history: Type 2 DM ?Outpatient Diabetes medications: Metformin 500 mg BID, Amaryl 4 mg QA, Novolog 4-12 units TID, Basaglar 20 units QHS ?Current orders for Inpatient glycemic control: Semglee 20 units QD, Novolog 0-20 units TID ?Prednisone 5 mg QD ? ?Inpatient Diabetes Program Recommendations:   ? ?Consider adding Novolog 4 units TID (assuming patient is consuming >50% of meals and increasing Semglee to 24 units QHS.  ? ?Thanks, ?Bronson Curb, MSN, RNC-OB ?Diabetes Coordinator ?[phone] (8a-5p) ? ? ? ? ? ?

## 2021-12-12 NOTE — Progress Notes (Signed)
Katherine [name] is a 66 y/o female with Acute on Chronic respiratory hypercarbia Failure and COPD . Patient presented to ED with complaints of sob and respiratory failure. Upon admission (12/07/2021), patient was placed on Bipap ST/Avaps and failed due to hypercapnia with a PCO2=73.2. Due to patient's condition, she is at risk of worsening of Respiratory failure and COPD. Patient has had numerous readmissions in the last 2 months with issues involving respiratory failure. Bipap ST/Avaps has been tried and failed however; patient will require volume ventilation to promote gas exchange via NIV therapy. Patient also requires mouthpiece ventilation for daytime as the pco2 elevates during the day. Due to the severity of the patient and the need for daytime mouthpiece ventilation, back up battery, alarms and portability an NIV is required which is not supported by the bipap/rad/bipap avaps device." Patient is able to keep airway clear and clear secretions.  ?

## 2021-12-12 NOTE — Progress Notes (Signed)
Patient complaining of chest tightness around 1605, paged MD Nettey at 1610. Orders to get an EKG. EKG was stable and patient is saying chest pain is improving. Will continue to monitor.  ?

## 2021-12-13 NOTE — Plan of Care (Signed)
?  Problem: Education: Goal: Ability to demonstrate management of disease process will improve Outcome: Adequate for Discharge Goal: Ability to verbalize understanding of medication therapies will improve Outcome: Adequate for Discharge Goal: Individualized Educational Video(s) Outcome: Adequate for Discharge   Problem: Activity: Goal: Capacity to carry out activities will improve Outcome: Adequate for Discharge   Problem: Cardiac: Goal: Ability to achieve and maintain adequate cardiopulmonary perfusion will improve Outcome: Adequate for Discharge   Problem: Education: Goal: Knowledge of disease or condition will improve Outcome: Adequate for Discharge Goal: Knowledge of the prescribed therapeutic regimen will improve Outcome: Adequate for Discharge Goal: Individualized Educational Video(s) Outcome: Adequate for Discharge   Problem: Activity: Goal: Ability to tolerate increased activity will improve Outcome: Adequate for Discharge Goal: Will verbalize the importance of balancing activity with adequate rest periods Outcome: Adequate for Discharge   Problem: Respiratory: Goal: Ability to maintain a clear airway will improve Outcome: Adequate for Discharge Goal: Levels of oxygenation will improve Outcome: Adequate for Discharge Goal: Ability to maintain adequate ventilation will improve Outcome: Adequate for Discharge   Problem: Education: Goal: Knowledge of General Education information will improve Description: Including pain rating scale, medication(s)/side effects and non-pharmacologic comfort measures Outcome: Adequate for Discharge   Problem: Health Behavior/Discharge Planning: Goal: Ability to manage health-related needs will improve Outcome: Adequate for Discharge   Problem: Clinical Measurements: Goal: Ability to maintain clinical measurements within normal limits will improve Outcome: Adequate for Discharge Goal: Will remain free from infection Outcome:  Adequate for Discharge Goal: Diagnostic test results will improve Outcome: Adequate for Discharge Goal: Respiratory complications will improve Outcome: Adequate for Discharge Goal: Cardiovascular complication will be avoided Outcome: Adequate for Discharge   Problem: Activity: Goal: Risk for activity intolerance will decrease Outcome: Adequate for Discharge   Problem: Nutrition: Goal: Adequate nutrition will be maintained Outcome: Adequate for Discharge   Problem: Coping: Goal: Level of anxiety will decrease Outcome: Adequate for Discharge   Problem: Elimination: Goal: Will not experience complications related to bowel motility Outcome: Adequate for Discharge Goal: Will not experience complications related to urinary retention Outcome: Adequate for Discharge   Problem: Pain Managment: Goal: General experience of comfort will improve Outcome: Adequate for Discharge   Problem: Safety: Goal: Ability to remain free from injury will improve Outcome: Adequate for Discharge   Problem: Skin Integrity: Goal: Risk for impaired skin integrity will decrease Outcome: Adequate for Discharge   

## 2021-12-15 ENCOUNTER — Telehealth: Payer: Self-pay

## 2021-12-15 ENCOUNTER — Telehealth: Payer: Self-pay | Admitting: *Deleted

## 2021-12-15 ENCOUNTER — Telehealth: Payer: Self-pay | Admitting: Internal Medicine

## 2021-12-15 DIAGNOSIS — E1169 Type 2 diabetes mellitus with other specified complication: Secondary | ICD-10-CM | POA: Diagnosis not present

## 2021-12-15 DIAGNOSIS — E1165 Type 2 diabetes mellitus with hyperglycemia: Secondary | ICD-10-CM | POA: Diagnosis not present

## 2021-12-15 DIAGNOSIS — N189 Chronic kidney disease, unspecified: Secondary | ICD-10-CM | POA: Diagnosis not present

## 2021-12-15 DIAGNOSIS — L89613 Pressure ulcer of right heel, stage 3: Secondary | ICD-10-CM | POA: Diagnosis not present

## 2021-12-15 DIAGNOSIS — D631 Anemia in chronic kidney disease: Secondary | ICD-10-CM | POA: Diagnosis not present

## 2021-12-15 DIAGNOSIS — G8929 Other chronic pain: Secondary | ICD-10-CM | POA: Diagnosis not present

## 2021-12-15 DIAGNOSIS — E1159 Type 2 diabetes mellitus with other circulatory complications: Secondary | ICD-10-CM | POA: Diagnosis not present

## 2021-12-15 DIAGNOSIS — I6523 Occlusion and stenosis of bilateral carotid arteries: Secondary | ICD-10-CM | POA: Diagnosis not present

## 2021-12-15 DIAGNOSIS — I25118 Atherosclerotic heart disease of native coronary artery with other forms of angina pectoris: Secondary | ICD-10-CM | POA: Diagnosis not present

## 2021-12-15 DIAGNOSIS — I214 Non-ST elevation (NSTEMI) myocardial infarction: Secondary | ICD-10-CM | POA: Diagnosis not present

## 2021-12-15 DIAGNOSIS — I152 Hypertension secondary to endocrine disorders: Secondary | ICD-10-CM | POA: Diagnosis not present

## 2021-12-15 DIAGNOSIS — I5043 Acute on chronic combined systolic (congestive) and diastolic (congestive) heart failure: Secondary | ICD-10-CM | POA: Diagnosis not present

## 2021-12-15 DIAGNOSIS — E1151 Type 2 diabetes mellitus with diabetic peripheral angiopathy without gangrene: Secondary | ICD-10-CM | POA: Diagnosis not present

## 2021-12-15 DIAGNOSIS — E785 Hyperlipidemia, unspecified: Secondary | ICD-10-CM | POA: Diagnosis not present

## 2021-12-15 DIAGNOSIS — G4733 Obstructive sleep apnea (adult) (pediatric): Secondary | ICD-10-CM | POA: Diagnosis not present

## 2021-12-15 DIAGNOSIS — I4821 Permanent atrial fibrillation: Secondary | ICD-10-CM | POA: Diagnosis not present

## 2021-12-15 DIAGNOSIS — E1122 Type 2 diabetes mellitus with diabetic chronic kidney disease: Secondary | ICD-10-CM | POA: Diagnosis not present

## 2021-12-15 DIAGNOSIS — J441 Chronic obstructive pulmonary disease with (acute) exacerbation: Secondary | ICD-10-CM | POA: Diagnosis not present

## 2021-12-15 DIAGNOSIS — I350 Nonrheumatic aortic (valve) stenosis: Secondary | ICD-10-CM | POA: Diagnosis not present

## 2021-12-15 DIAGNOSIS — I69398 Other sequelae of cerebral infarction: Secondary | ICD-10-CM | POA: Diagnosis not present

## 2021-12-15 DIAGNOSIS — H5461 Unqualified visual loss, right eye, normal vision left eye: Secondary | ICD-10-CM | POA: Diagnosis not present

## 2021-12-15 DIAGNOSIS — J9611 Chronic respiratory failure with hypoxia: Secondary | ICD-10-CM | POA: Diagnosis not present

## 2021-12-15 DIAGNOSIS — I480 Paroxysmal atrial fibrillation: Secondary | ICD-10-CM | POA: Diagnosis not present

## 2021-12-15 DIAGNOSIS — M797 Fibromyalgia: Secondary | ICD-10-CM | POA: Diagnosis not present

## 2021-12-15 DIAGNOSIS — K219 Gastro-esophageal reflux disease without esophagitis: Secondary | ICD-10-CM | POA: Diagnosis not present

## 2021-12-15 NOTE — Telephone Encounter (Addendum)
Transition Care Management Unsuccessful Follow-up Telephone Call ? ?Date of discharge and from where:  [place] 12-13-21 Dx: Acute on chronic respiratory failure with hypoxia  ? ?Attempts:  1st Attempt ? ?Reason for unsuccessful TCM follow-up call:  Left voice message ? ?Transition Care Management Follow-up Telephone Call ?Date of discharge and from where: [place] 12-13-21 Dx: Acute on chronic respiratory failure with hypoxia  ?How have you been since you were released from the hospital? Doing ok  ?Any questions or concerns? No ? ?Items Reviewed: ?Did the pt receive and understand the discharge instructions provided? Yes  ?Medications obtained and verified? Yes  ?Other? No  ?Any new allergies since your discharge? No  ?Dietary orders reviewed? Yes ?Do you have support at home? Yes  ? ?Home Care and Equipment/Supplies: ?Were home health services ordered? yes ?If so, what is the name of the agency? [facility]   ?Has the agency set up a time to come to the patient's home? yes ?Were any new equipment or medical supplies ordered?  No ?What is the name of the medical supply agency? na ?Were you able to get the supplies/equipment? not applicable ?Do you have any questions related to the use of the equipment or supplies? No ? ?Functional Questionnaire: (I = Independent and D = Dependent) ?ADLs: I ? ?Bathing/Dressing- I ? ?Meal Prep- I ? ?Eating- I ? ?Maintaining continence- I ? ?Transferring/Ambulation- I ? ?Managing Meds- I ? ?Follow up appointments reviewed: ? ?PCP Hospital f/u appt confirmed? Yes  Scheduled to see Dr Ronnald Ramp  on 12-24-21 @ 220pm. ?[facility] f/u appt confirmed? Yes  Scheduled to see Cardiology on 12-16-21 @ 2pm. ?Are transportation arrangements needed? No  ?If their condition worsens, is the pt aware to call PCP or go to the Emergency Dept.? Yes ?Was the patient provided with contact information for the PCP's office or ED? Yes ?Was to pt encouraged to call back with questions  or concerns? Yes  ? ?  ?

## 2021-12-15 NOTE — Chronic Care Management (AMB) (Signed)
?  Care Management  ? ?Note ? ?12/15/2021 ?Name: Katherine [name] MRN: [id number] DOB: August 09, 1956 ? ?Katherine [name] is a 66 y.o. year old female who is a primary care patient of [name], MD and is actively engaged with the care management team. I reached out to [name] by phone today to assist with scheduling an initial visit with the RN Case Manager ? ?Follow up plan: ?Unsuccessful telephone outreach attempt made. A HIPAA compliant phone message was left for the patient providing contact information and requesting a return call.  ?The care management team will reach out to the patient again over the next 7 days.  ?If patient returns call to provider office, please advise to call [place] at [phone]. ? ?Laverda Sorenson  ?Care Guide, Embedded Care Coordination ?[place]  Care Management  ?Direct Dial: [phone] ? ?

## 2021-12-15 NOTE — Telephone Encounter (Signed)
Pt called in requesting that assistant call back ASAP.  ? ?Pt upset that appt was "no showed" opposed to cancelled. Message was put in to cancel appt but appt was not cancelled when pt called in.  ? ?States that will chare her insurance. Offered pt to r/s appt and she states she does not want to r/s until she speaks with assistant.   ? ? ?

## 2021-12-16 DIAGNOSIS — I152 Hypertension secondary to endocrine disorders: Secondary | ICD-10-CM | POA: Diagnosis not present

## 2021-12-16 DIAGNOSIS — J9611 Chronic respiratory failure with hypoxia: Secondary | ICD-10-CM | POA: Diagnosis not present

## 2021-12-16 DIAGNOSIS — E1159 Type 2 diabetes mellitus with other circulatory complications: Secondary | ICD-10-CM | POA: Diagnosis not present

## 2021-12-16 DIAGNOSIS — J441 Chronic obstructive pulmonary disease with (acute) exacerbation: Secondary | ICD-10-CM | POA: Diagnosis not present

## 2021-12-16 DIAGNOSIS — E1122 Type 2 diabetes mellitus with diabetic chronic kidney disease: Secondary | ICD-10-CM | POA: Diagnosis not present

## 2021-12-16 DIAGNOSIS — N189 Chronic kidney disease, unspecified: Secondary | ICD-10-CM | POA: Diagnosis not present

## 2021-12-17 NOTE — Chronic Care Management (AMB) (Signed)
?  Care Management  ? ?Outreach Note ? ?12/17/2021 ?Name: Katherine [name] MRN: [id number] DOB: 12-Feb-1956 ? ?Referred by: [name], MD ?Reason for referral : Chronic Care Management (Outreach to schedule initial outreach with [facility] ) ? ? ?A second unsuccessful telephone outreach was attempted today. The patient was referred to the case management team for assistance with care management and care coordination.  ? ?Follow Up Plan:  ?A HIPAA compliant phone message was left for the patient providing contact information and requesting a return call.  ?The care management team will reach out to the patient again over the next 7 days.  ?If patient returns call to provider office, please advise to call [place]* at [phone].* ? ?Laverda Sorenson  ?Care Guide, Embedded Care Coordination ?[place]  Care Management  ?Direct Dial: [phone] ? ?

## 2021-12-18 DIAGNOSIS — I152 Hypertension secondary to endocrine disorders: Secondary | ICD-10-CM | POA: Diagnosis not present

## 2021-12-18 DIAGNOSIS — N189 Chronic kidney disease, unspecified: Secondary | ICD-10-CM | POA: Diagnosis not present

## 2021-12-18 DIAGNOSIS — E1122 Type 2 diabetes mellitus with diabetic chronic kidney disease: Secondary | ICD-10-CM | POA: Diagnosis not present

## 2021-12-18 DIAGNOSIS — J441 Chronic obstructive pulmonary disease with (acute) exacerbation: Secondary | ICD-10-CM | POA: Diagnosis not present

## 2021-12-18 DIAGNOSIS — J9611 Chronic respiratory failure with hypoxia: Secondary | ICD-10-CM | POA: Diagnosis not present

## 2021-12-18 DIAGNOSIS — E1159 Type 2 diabetes mellitus with other circulatory complications: Secondary | ICD-10-CM | POA: Diagnosis not present

## 2021-12-18 NOTE — Chronic Care Management (AMB) (Signed)
?  Care Management  ? ?Note ? ?12/18/2021 ?Name: Katherine [name] MRN: [id number] DOB: 03-05-1956 ? ?Katherine [name] is a 66 y.o. year old female who is a primary care patient of [name], MD. I reached out to [name] by phone today offer care coordination services.  ? ?Katherine [name] was given information about care management services today including:  ?Care management services include personalized support from designated clinical staff supervised by her physician, including individualized plan of care and coordination with other care providers ?24/7 contact phone numbers for assistance for urgent and routine care needs. ?The patient may stop care management services at any time by phone call to the office staff. ? ?Patient agreed to services and verbal consent obtained.  ? ?Follow up plan: ?Telephone appointment with care management team member scheduled for:12/25/21 ? ?Laverda Sorenson  ?Care Guide, Embedded Care Coordination ?[place]  Care Management  ?Direct Dial: [phone]  ?

## 2021-12-19 ENCOUNTER — Other Ambulatory Visit: Payer: Self-pay | Admitting: Internal Medicine

## 2021-12-19 ENCOUNTER — Telehealth: Payer: Self-pay | Admitting: Internal Medicine

## 2021-12-19 DIAGNOSIS — M4726 Other spondylosis with radiculopathy, lumbar region: Secondary | ICD-10-CM

## 2021-12-19 DIAGNOSIS — M47816 Spondylosis without myelopathy or radiculopathy, lumbar region: Secondary | ICD-10-CM

## 2021-12-19 DIAGNOSIS — Z515 Encounter for palliative care: Secondary | ICD-10-CM

## 2021-12-19 DIAGNOSIS — M1711 Unilateral primary osteoarthritis, right knee: Secondary | ICD-10-CM

## 2021-12-19 MED ORDER — CYCLOBENZAPRINE HCL 10 MG PO TABS: 10.0000 mg | ORAL_TABLET | Freq: Two times a day (BID) | ORAL | 0 refills | Status: DC | PRN

## 2021-12-19 MED ORDER — OXYCODONE HCL 10 MG PO TABS: 10.0000 mg | ORAL_TABLET | Freq: Three times a day (TID) | ORAL | 0 refills | Status: DC | PRN

## 2021-12-19 NOTE — Telephone Encounter (Signed)
1.Medication Requested: ?Oxycodone HCl 10 MG TABS ? ?cyclobenzaprine (FLEXERIL) 10 MG tablet ?2. Pharmacy (Name, Street, [place]): [id number] [address] [place] Phone:  [phone] ?Fax:  [phone] ?  ? ?3. On Med List: yes ? ?4. Last Visit with PCP: ? ?5. Next visit date with PCP: ? ? ?Agent: Please be advised that RX refills may take up to 3 business days. We ask that you follow-up with your pharmacy.  ?

## 2021-12-22 ENCOUNTER — Other Ambulatory Visit: Payer: Self-pay | Admitting: Internal Medicine

## 2021-12-22 DIAGNOSIS — M47816 Spondylosis without myelopathy or radiculopathy, lumbar region: Secondary | ICD-10-CM

## 2021-12-22 DIAGNOSIS — M1711 Unilateral primary osteoarthritis, right knee: Secondary | ICD-10-CM

## 2021-12-22 DIAGNOSIS — Z515 Encounter for palliative care: Secondary | ICD-10-CM

## 2021-12-22 DIAGNOSIS — M4726 Other spondylosis with radiculopathy, lumbar region: Secondary | ICD-10-CM

## 2021-12-22 NOTE — Telephone Encounter (Signed)
Patient needs her hydrocodone sent to [facility], [place] ?

## 2021-12-23 ENCOUNTER — Encounter (HOSPITAL_COMMUNITY): Payer: Medicare Other

## 2021-12-23 ENCOUNTER — Telehealth: Payer: Self-pay

## 2021-12-23 ENCOUNTER — Telehealth (HOSPITAL_COMMUNITY): Payer: Self-pay | Admitting: *Deleted

## 2021-12-23 ENCOUNTER — Other Ambulatory Visit: Payer: Self-pay | Admitting: Internal Medicine

## 2021-12-23 DIAGNOSIS — M1711 Unilateral primary osteoarthritis, right knee: Secondary | ICD-10-CM

## 2021-12-23 DIAGNOSIS — Z515 Encounter for palliative care: Secondary | ICD-10-CM

## 2021-12-23 DIAGNOSIS — M4726 Other spondylosis with radiculopathy, lumbar region: Secondary | ICD-10-CM

## 2021-12-23 DIAGNOSIS — M47816 Spondylosis without myelopathy or radiculopathy, lumbar region: Secondary | ICD-10-CM

## 2021-12-23 MED ORDER — OXYCODONE HCL 5 MG PO TABS: 5.0000 mg | ORAL_TABLET | Freq: Four times a day (QID) | ORAL | 0 refills | Status: DC | PRN

## 2021-12-23 NOTE — Telephone Encounter (Signed)
[facility]  of [facility] HCl. ?Pharmacy advised the pt that they have 5 mg available and pt is ask to have the 5 mg sent in. ? ?[id number] [place] ? ?Please advise ? ? ?

## 2021-12-23 NOTE — Telephone Encounter (Signed)
Heart Failure Nurse Navigator Progress Note  ? ?Spoke to patient, she asked to cancel her appointment for 5/9/223, because she hasn't slept in a few days and needs to see her pain doctor about her medications.  ? ? ?Earnestine Leys, BSN, RN ?Heart Failure Nurse Navigator ?Secure Chat Only  ?

## 2021-12-24 ENCOUNTER — Telehealth: Payer: Self-pay | Admitting: Internal Medicine

## 2021-12-24 ENCOUNTER — Ambulatory Visit: Payer: Medicare Other | Admitting: Internal Medicine

## 2021-12-24 NOTE — Telephone Encounter (Signed)
Ok to proceed with PT verbal orders listed below.  ? ?However, CB# is not a valid working number.  ?

## 2021-12-24 NOTE — Telephone Encounter (Signed)
Katherine [name] with [facility] called requesting home health PT orders 2 times per week for 1 week, 1 time per week for 1 week, 2 times per week for 2 weeks, 1 time per week for 5 weeks. Patient also needs OT and Nursing 1 time per week for 1 week. Call back number for Tommi Rumps is [phone]. ?

## 2021-12-25 ENCOUNTER — Telehealth: Payer: Medicare Other

## 2021-12-25 DIAGNOSIS — E1159 Type 2 diabetes mellitus with other circulatory complications: Secondary | ICD-10-CM | POA: Diagnosis not present

## 2021-12-25 DIAGNOSIS — N189 Chronic kidney disease, unspecified: Secondary | ICD-10-CM | POA: Diagnosis not present

## 2021-12-25 DIAGNOSIS — J9611 Chronic respiratory failure with hypoxia: Secondary | ICD-10-CM | POA: Diagnosis not present

## 2021-12-25 DIAGNOSIS — E1122 Type 2 diabetes mellitus with diabetic chronic kidney disease: Secondary | ICD-10-CM | POA: Diagnosis not present

## 2021-12-25 DIAGNOSIS — J441 Chronic obstructive pulmonary disease with (acute) exacerbation: Secondary | ICD-10-CM | POA: Diagnosis not present

## 2021-12-25 DIAGNOSIS — I152 Hypertension secondary to endocrine disorders: Secondary | ICD-10-CM | POA: Diagnosis not present

## 2021-12-25 NOTE — Chronic Care Management (AMB) (Signed)
Please note that the patient has been referred for care management services but has not had a face to face visit with the primary care provider in the last 12 month period. This patient has been referred to the appropriate practice personnel for assistance with scheduling a PCP appointment.      

## 2021-12-26 ENCOUNTER — Telehealth (HOSPITAL_BASED_OUTPATIENT_CLINIC_OR_DEPARTMENT_OTHER): Payer: Self-pay

## 2021-12-26 ENCOUNTER — Other Ambulatory Visit (HOSPITAL_BASED_OUTPATIENT_CLINIC_OR_DEPARTMENT_OTHER): Payer: Self-pay

## 2021-12-26 ENCOUNTER — Other Ambulatory Visit (HOSPITAL_COMMUNITY): Payer: Self-pay

## 2021-12-26 ENCOUNTER — Other Ambulatory Visit: Payer: Self-pay | Admitting: Internal Medicine

## 2021-12-26 DIAGNOSIS — J9611 Chronic respiratory failure with hypoxia: Secondary | ICD-10-CM | POA: Diagnosis not present

## 2021-12-26 DIAGNOSIS — I152 Hypertension secondary to endocrine disorders: Secondary | ICD-10-CM | POA: Diagnosis not present

## 2021-12-26 DIAGNOSIS — E1122 Type 2 diabetes mellitus with diabetic chronic kidney disease: Secondary | ICD-10-CM | POA: Diagnosis not present

## 2021-12-26 DIAGNOSIS — N189 Chronic kidney disease, unspecified: Secondary | ICD-10-CM | POA: Diagnosis not present

## 2021-12-26 DIAGNOSIS — J441 Chronic obstructive pulmonary disease with (acute) exacerbation: Secondary | ICD-10-CM | POA: Diagnosis not present

## 2021-12-26 DIAGNOSIS — E1159 Type 2 diabetes mellitus with other circulatory complications: Secondary | ICD-10-CM | POA: Diagnosis not present

## 2021-12-26 MED ORDER — RANOLAZINE ER 1000 MG PO TB12
1000.0000 mg | ORAL_TABLET | Freq: Two times a day (BID) | ORAL | 0 refills | Status: DC
  Filled 2021-12-26: qty 60, 30d supply, fill #0

## 2021-12-26 NOTE — Telephone Encounter (Signed)
Pharmacy Transitions of Care Follow-up Telephone Call ? ?Date of discharge: 12/12/2021  ?Discharge Diagnosis: Acute on chronic respiratory failure with hypoxia ? ?How have you been since you were released from the hospital? Pt reports doing okay, getting stronger and recuperating from hospitalization.  ? ?Medication changes made at discharge: ?Ranolazine increased ? ?Medication changes verified by the patient? Yes ? ?Medication Accessibility: ? ?[facility] ? ?Was the patient provided with refills on discharged medications? Metoprolol-yes, ranolazine-no. ? ?Have all prescriptions been transferred from [facility] to home pharmacy? Yes. Sending rx request to PCP for ranolazine refills. ? ?

## 2021-12-29 ENCOUNTER — Other Ambulatory Visit (HOSPITAL_BASED_OUTPATIENT_CLINIC_OR_DEPARTMENT_OTHER): Payer: Self-pay

## 2021-12-29 DIAGNOSIS — J441 Chronic obstructive pulmonary disease with (acute) exacerbation: Secondary | ICD-10-CM | POA: Diagnosis not present

## 2021-12-29 DIAGNOSIS — E1159 Type 2 diabetes mellitus with other circulatory complications: Secondary | ICD-10-CM | POA: Diagnosis not present

## 2021-12-29 DIAGNOSIS — I152 Hypertension secondary to endocrine disorders: Secondary | ICD-10-CM | POA: Diagnosis not present

## 2021-12-29 DIAGNOSIS — J9611 Chronic respiratory failure with hypoxia: Secondary | ICD-10-CM | POA: Diagnosis not present

## 2021-12-29 DIAGNOSIS — E1122 Type 2 diabetes mellitus with diabetic chronic kidney disease: Secondary | ICD-10-CM | POA: Diagnosis not present

## 2021-12-29 DIAGNOSIS — N189 Chronic kidney disease, unspecified: Secondary | ICD-10-CM | POA: Diagnosis not present

## 2021-12-30 DIAGNOSIS — I152 Hypertension secondary to endocrine disorders: Secondary | ICD-10-CM | POA: Diagnosis not present

## 2021-12-30 DIAGNOSIS — N189 Chronic kidney disease, unspecified: Secondary | ICD-10-CM | POA: Diagnosis not present

## 2021-12-30 DIAGNOSIS — J441 Chronic obstructive pulmonary disease with (acute) exacerbation: Secondary | ICD-10-CM | POA: Diagnosis not present

## 2021-12-30 DIAGNOSIS — E1159 Type 2 diabetes mellitus with other circulatory complications: Secondary | ICD-10-CM | POA: Diagnosis not present

## 2021-12-30 DIAGNOSIS — J9611 Chronic respiratory failure with hypoxia: Secondary | ICD-10-CM | POA: Diagnosis not present

## 2021-12-30 DIAGNOSIS — E1122 Type 2 diabetes mellitus with diabetic chronic kidney disease: Secondary | ICD-10-CM | POA: Diagnosis not present

## 2021-12-31 ENCOUNTER — Telehealth: Payer: Self-pay | Admitting: Internal Medicine

## 2021-12-31 NOTE — Telephone Encounter (Signed)
Pt requesting a call back to discuss anxiety levels.  ? ?States that it is through the roof and wants to have a stronger dose or new rx of medication to help her "get straight:" ?

## 2022-01-02 ENCOUNTER — Other Ambulatory Visit: Payer: Self-pay | Admitting: Internal Medicine

## 2022-01-02 DIAGNOSIS — J441 Chronic obstructive pulmonary disease with (acute) exacerbation: Secondary | ICD-10-CM | POA: Diagnosis not present

## 2022-01-02 DIAGNOSIS — N189 Chronic kidney disease, unspecified: Secondary | ICD-10-CM | POA: Diagnosis not present

## 2022-01-02 DIAGNOSIS — E1159 Type 2 diabetes mellitus with other circulatory complications: Secondary | ICD-10-CM | POA: Diagnosis not present

## 2022-01-02 DIAGNOSIS — J9611 Chronic respiratory failure with hypoxia: Secondary | ICD-10-CM | POA: Diagnosis not present

## 2022-01-02 DIAGNOSIS — E1122 Type 2 diabetes mellitus with diabetic chronic kidney disease: Secondary | ICD-10-CM | POA: Diagnosis not present

## 2022-01-02 DIAGNOSIS — I152 Hypertension secondary to endocrine disorders: Secondary | ICD-10-CM | POA: Diagnosis not present

## 2022-01-05 DIAGNOSIS — N189 Chronic kidney disease, unspecified: Secondary | ICD-10-CM | POA: Diagnosis not present

## 2022-01-05 DIAGNOSIS — E1122 Type 2 diabetes mellitus with diabetic chronic kidney disease: Secondary | ICD-10-CM | POA: Diagnosis not present

## 2022-01-05 DIAGNOSIS — J441 Chronic obstructive pulmonary disease with (acute) exacerbation: Secondary | ICD-10-CM | POA: Diagnosis not present

## 2022-01-05 DIAGNOSIS — E1159 Type 2 diabetes mellitus with other circulatory complications: Secondary | ICD-10-CM | POA: Diagnosis not present

## 2022-01-05 DIAGNOSIS — I152 Hypertension secondary to endocrine disorders: Secondary | ICD-10-CM | POA: Diagnosis not present

## 2022-01-05 DIAGNOSIS — J9611 Chronic respiratory failure with hypoxia: Secondary | ICD-10-CM | POA: Diagnosis not present

## 2022-01-06 ENCOUNTER — Other Ambulatory Visit (HOSPITAL_COMMUNITY): Payer: Self-pay

## 2022-01-06 DIAGNOSIS — E1159 Type 2 diabetes mellitus with other circulatory complications: Secondary | ICD-10-CM | POA: Diagnosis not present

## 2022-01-06 DIAGNOSIS — E1122 Type 2 diabetes mellitus with diabetic chronic kidney disease: Secondary | ICD-10-CM | POA: Diagnosis not present

## 2022-01-06 DIAGNOSIS — J441 Chronic obstructive pulmonary disease with (acute) exacerbation: Secondary | ICD-10-CM | POA: Diagnosis not present

## 2022-01-06 DIAGNOSIS — J9611 Chronic respiratory failure with hypoxia: Secondary | ICD-10-CM | POA: Diagnosis not present

## 2022-01-06 DIAGNOSIS — N189 Chronic kidney disease, unspecified: Secondary | ICD-10-CM | POA: Diagnosis not present

## 2022-01-06 DIAGNOSIS — I152 Hypertension secondary to endocrine disorders: Secondary | ICD-10-CM | POA: Diagnosis not present

## 2022-01-09 DIAGNOSIS — J441 Chronic obstructive pulmonary disease with (acute) exacerbation: Secondary | ICD-10-CM | POA: Diagnosis not present

## 2022-01-09 DIAGNOSIS — I152 Hypertension secondary to endocrine disorders: Secondary | ICD-10-CM | POA: Diagnosis not present

## 2022-01-09 DIAGNOSIS — E1159 Type 2 diabetes mellitus with other circulatory complications: Secondary | ICD-10-CM | POA: Diagnosis not present

## 2022-01-09 DIAGNOSIS — N189 Chronic kidney disease, unspecified: Secondary | ICD-10-CM | POA: Diagnosis not present

## 2022-01-09 DIAGNOSIS — E1122 Type 2 diabetes mellitus with diabetic chronic kidney disease: Secondary | ICD-10-CM | POA: Diagnosis not present

## 2022-01-09 DIAGNOSIS — J9611 Chronic respiratory failure with hypoxia: Secondary | ICD-10-CM | POA: Diagnosis not present

## 2022-01-14 ENCOUNTER — Telehealth: Payer: Self-pay | Admitting: Cardiology

## 2022-01-14 DIAGNOSIS — N182 Chronic kidney disease, stage 2 (mild): Secondary | ICD-10-CM | POA: Diagnosis not present

## 2022-01-14 DIAGNOSIS — I214 Non-ST elevation (NSTEMI) myocardial infarction: Secondary | ICD-10-CM | POA: Diagnosis not present

## 2022-01-14 DIAGNOSIS — J9611 Chronic respiratory failure with hypoxia: Secondary | ICD-10-CM | POA: Diagnosis not present

## 2022-01-14 DIAGNOSIS — K219 Gastro-esophageal reflux disease without esophagitis: Secondary | ICD-10-CM | POA: Diagnosis not present

## 2022-01-14 DIAGNOSIS — N189 Chronic kidney disease, unspecified: Secondary | ICD-10-CM | POA: Diagnosis not present

## 2022-01-14 DIAGNOSIS — R413 Other amnesia: Secondary | ICD-10-CM | POA: Diagnosis not present

## 2022-01-14 DIAGNOSIS — I4821 Permanent atrial fibrillation: Secondary | ICD-10-CM | POA: Diagnosis not present

## 2022-01-14 DIAGNOSIS — I2510 Atherosclerotic heart disease of native coronary artery without angina pectoris: Secondary | ICD-10-CM | POA: Diagnosis not present

## 2022-01-14 DIAGNOSIS — E1151 Type 2 diabetes mellitus with diabetic peripheral angiopathy without gangrene: Secondary | ICD-10-CM | POA: Diagnosis not present

## 2022-01-14 DIAGNOSIS — E1165 Type 2 diabetes mellitus with hyperglycemia: Secondary | ICD-10-CM | POA: Diagnosis not present

## 2022-01-14 DIAGNOSIS — E785 Hyperlipidemia, unspecified: Secondary | ICD-10-CM | POA: Diagnosis not present

## 2022-01-14 DIAGNOSIS — I5043 Acute on chronic combined systolic (congestive) and diastolic (congestive) heart failure: Secondary | ICD-10-CM | POA: Diagnosis not present

## 2022-01-14 DIAGNOSIS — G8929 Other chronic pain: Secondary | ICD-10-CM | POA: Diagnosis not present

## 2022-01-14 DIAGNOSIS — I69398 Other sequelae of cerebral infarction: Secondary | ICD-10-CM | POA: Diagnosis not present

## 2022-01-14 DIAGNOSIS — I480 Paroxysmal atrial fibrillation: Secondary | ICD-10-CM | POA: Diagnosis not present

## 2022-01-14 DIAGNOSIS — H5461 Unqualified visual loss, right eye, normal vision left eye: Secondary | ICD-10-CM | POA: Diagnosis not present

## 2022-01-14 DIAGNOSIS — E1122 Type 2 diabetes mellitus with diabetic chronic kidney disease: Secondary | ICD-10-CM | POA: Diagnosis not present

## 2022-01-14 DIAGNOSIS — E1169 Type 2 diabetes mellitus with other specified complication: Secondary | ICD-10-CM | POA: Diagnosis not present

## 2022-01-14 DIAGNOSIS — I129 Hypertensive chronic kidney disease with stage 1 through stage 4 chronic kidney disease, or unspecified chronic kidney disease: Secondary | ICD-10-CM | POA: Diagnosis not present

## 2022-01-14 DIAGNOSIS — C4490 Unspecified malignant neoplasm of skin, unspecified: Secondary | ICD-10-CM | POA: Diagnosis not present

## 2022-01-14 DIAGNOSIS — D631 Anemia in chronic kidney disease: Secondary | ICD-10-CM | POA: Diagnosis not present

## 2022-01-14 DIAGNOSIS — E118 Type 2 diabetes mellitus with unspecified complications: Secondary | ICD-10-CM | POA: Diagnosis not present

## 2022-01-14 DIAGNOSIS — I6523 Occlusion and stenosis of bilateral carotid arteries: Secondary | ICD-10-CM | POA: Diagnosis not present

## 2022-01-14 DIAGNOSIS — M797 Fibromyalgia: Secondary | ICD-10-CM | POA: Diagnosis not present

## 2022-01-14 DIAGNOSIS — E1159 Type 2 diabetes mellitus with other circulatory complications: Secondary | ICD-10-CM | POA: Diagnosis not present

## 2022-01-14 DIAGNOSIS — Z940 Kidney transplant status: Secondary | ICD-10-CM | POA: Diagnosis not present

## 2022-01-14 DIAGNOSIS — J441 Chronic obstructive pulmonary disease with (acute) exacerbation: Secondary | ICD-10-CM | POA: Diagnosis not present

## 2022-01-14 DIAGNOSIS — R300 Dysuria: Secondary | ICD-10-CM | POA: Diagnosis not present

## 2022-01-14 DIAGNOSIS — G4733 Obstructive sleep apnea (adult) (pediatric): Secondary | ICD-10-CM | POA: Diagnosis not present

## 2022-01-14 DIAGNOSIS — I152 Hypertension secondary to endocrine disorders: Secondary | ICD-10-CM | POA: Diagnosis not present

## 2022-01-14 DIAGNOSIS — I25118 Atherosclerotic heart disease of native coronary artery with other forms of angina pectoris: Secondary | ICD-10-CM | POA: Diagnosis not present

## 2022-01-14 DIAGNOSIS — I350 Nonrheumatic aortic (valve) stenosis: Secondary | ICD-10-CM | POA: Diagnosis not present

## 2022-01-14 DIAGNOSIS — L89613 Pressure ulcer of right heel, stage 3: Secondary | ICD-10-CM | POA: Diagnosis not present

## 2022-01-14 MED ORDER — ISOSORBIDE MONONITRATE ER 60 MG PO TB24: 60.0000 mg | ORAL_TABLET | Freq: Every day | ORAL | 1 refills | Status: DC

## 2022-01-14 NOTE — Telephone Encounter (Signed)
Refills has been sent to pharmacy. 

## 2022-01-14 NOTE — Telephone Encounter (Signed)
Pt c/o medication issue:  1. Name of Medication: isosorbide mononitrate (IMDUR) 60 MG 24 hr tablet  2. How are you currently taking this medication (dosage and times per day)? Take 1 tablet (60 mg total) by mouth daily.  3. Are you having a reaction (difficulty breathing--STAT)? No   4. What is your medication issue? Patient is out of medication and needs a new 90 prescription [facility]  [id number] [place] [place]

## 2022-01-14 NOTE — Telephone Encounter (Signed)
*  STAT* If patient is at the pharmacy, call can be transferred to refill team.   1. Which medications need to be refilled? (please list name of each medication and dose if known) isosorbide mononitrate (IMDUR) 60 MG 24 hr tablet  2. Which pharmacy/location (including street and city if local pharmacy) is medication to be sent to? [id number] [place] 3. Do they need a 30 day or 90 day supply? 90  Patient is out of medication after today

## 2022-01-15 DIAGNOSIS — J441 Chronic obstructive pulmonary disease with (acute) exacerbation: Secondary | ICD-10-CM | POA: Diagnosis not present

## 2022-01-15 DIAGNOSIS — I152 Hypertension secondary to endocrine disorders: Secondary | ICD-10-CM | POA: Diagnosis not present

## 2022-01-15 DIAGNOSIS — J9611 Chronic respiratory failure with hypoxia: Secondary | ICD-10-CM | POA: Diagnosis not present

## 2022-01-15 DIAGNOSIS — N189 Chronic kidney disease, unspecified: Secondary | ICD-10-CM | POA: Diagnosis not present

## 2022-01-15 DIAGNOSIS — E1122 Type 2 diabetes mellitus with diabetic chronic kidney disease: Secondary | ICD-10-CM | POA: Diagnosis not present

## 2022-01-15 DIAGNOSIS — E1159 Type 2 diabetes mellitus with other circulatory complications: Secondary | ICD-10-CM | POA: Diagnosis not present

## 2022-01-16 ENCOUNTER — Telehealth: Payer: Self-pay

## 2022-01-16 NOTE — Chronic Care Management (AMB) (Signed)
Chronic Care Management Pharmacy Assistant   Name: Katherine [name]  MRN: [id number] DOB: 08-13-1956    Reason for Encounter: Disease State-General    Recent office visits:  None since the last coordination call 10/23/21  Recent consult visits:  None since the last coordination call 10/23/21  Hospital visits:  Medication Reconciliation was completed by comparing discharge summary, patient's EMR and Pharmacy list, and upon discussion with patient.  Admitted to the hospital on 12/07/21 due to Acute on chronic respiratory failure with hypoxia . Discharge date was 12/13/21. Discharged from [place]?Medications Started at [facility] Discharge:?? -started ensure max protein, multivitamin   Medication Changes at Hospital Discharge: -Changed Ranexa 1000 mg   Medications that remain the same after Hospital Discharge:??  -All other medications will remain the same.    Medications: Outpatient Encounter Medications as of 01/16/2022  Medication Sig   albuterol (VENTOLIN HFA) 108 (90 Base) MCG/ACT inhaler TAKE 2 PUFFS BY MOUTH EVERY 6 HOURS AS NEEDED FOR WHEEZE OR SHORTNESS OF BREATH (Patient taking differently: 2 puffs every 6 (six) hours as needed for wheezing or shortness of breath.)   apixaban (ELIQUIS) 5 MG TABS tablet Take 1 tablet (5 mg total) by mouth 2 (two) times daily.   arformoterol (BROVANA) 15 MCG/2ML NEBU Take 2 mLs (15 mcg total) by nebulization 2 (two) times daily.   azaTHIOprine (IMURAN) 50 MG tablet Take 2 & 1/2 tablets (125 mg) by mouth daily at 3pm (Patient taking differently: Take 125 mg by mouth daily.)   bisacodyl (DULCOLAX) 5 MG EC tablet Take 1 tablet (5 mg total) by mouth daily as needed for moderate constipation.   budesonide (PULMICORT) 0.5 MG/2ML nebulizer solution Take 2 mLs (0.5 mg total) by nebulization 2 (two) times daily. (Patient taking differently: Take 2 mLs by nebulization 2 (two) times daily as needed (wheezing).)   calcitRIOL  (ROCALTROL) 0.25 MCG capsule Take 0.25 mcg by mouth See admin instructions. Takes every 3 days.   clopidogrel (PLAVIX) 75 MG tablet Take 75 mg by mouth daily. (Patient not taking: Reported on 12/16/2021)   cyclobenzaprine (FLEXERIL) 10 MG tablet Take 1 tablet (10 mg total) by mouth 2 (two) times daily as needed for muscle spasms.   Ensure Max Protein (ENSURE MAX PROTEIN) LIQD Take 330 mLs (11 oz total) by mouth 2 (two) times daily. (Patient not taking: Reported on 12/16/2021)   fluticasone (FLONASE) 50 MCG/ACT nasal spray PLACE 2 SPRAYS INTO BOTH NOSTRILS 2 TIMES DAILY. (Patient taking differently: 2 sprays in the morning and at bedtime.)   furosemide (LASIX) 40 MG tablet Take 40 mg by mouth daily.   gabapentin (NEURONTIN) 100 MG capsule Take 1 capsule (100 mg total) by mouth at bedtime.   glimepiride (AMARYL) 4 MG tablet Take 4 mg by mouth daily with breakfast.   insulin aspart (NOVOLOG FLEXPEN) 100 UNIT/ML FlexPen Inject 4-12 Units into the skin 3 (three) times daily with meals. Sliding scale.   Insulin Glargine (BASAGLAR KWIKPEN) 100 UNIT/ML Inject 20 Units into the skin daily.   isosorbide mononitrate (IMDUR) 60 MG 24 hr tablet Take 1 tablet (60 mg total) by mouth daily.   lamoTRIgine (LAMICTAL) 25 MG tablet TAKE 1 TABLET BY MOUTH EVERYDAY AT BEDTIME (Patient taking differently: Take 25 mg by mouth daily.)   LORazepam (ATIVAN) 2 MG/ML concentrated solution Place 0.5 mLs (1 mg total) under the tongue every 8 (eight) hours as needed for anxiety, seizure, sedation or sleep (distress).   metFORMIN (  GLUCOPHAGE) 500 MG tablet Take 500 mg by mouth 2 (two) times daily.   metoprolol tartrate (LOPRESSOR) 25 MG tablet Take 1 tablet (25 mg total) by mouth 3 (three) times daily.   montelukast (SINGULAIR) 10 MG tablet TAKE 1 TABLET BY MOUTH EVERYDAY AT BEDTIME (Patient taking differently: Take 10 mg by mouth at bedtime.)   Multiple Vitamin (MULTIVITAMIN WITH MINERALS) TABS tablet Take 1 tablet by mouth daily.    nitroGLYCERIN (NITROSTAT) 0.4 MG SL tablet Place 0.4 mg under the tongue every 5 (five) minutes as needed for chest pain.   OLANZapine (ZYPREXA) 5 MG tablet Take 5 mg by mouth at bedtime.   oxyCODONE (OXY IR/ROXICODONE) 5 MG immediate release tablet Take 1 tablet (5 mg total) by mouth every 6 (six) hours as needed for severe pain.   OXYGEN Inhale 4 L into the lungs continuous.   pantoprazole (PROTONIX) 40 MG tablet TAKE 1 TABLET BY MOUTH EVERY DAY (Patient taking differently: Take 40 mg by mouth daily.)   predniSONE (DELTASONE) 5 MG tablet Take 5 mg by mouth daily with breakfast.   promethazine (PHENERGAN) 25 MG tablet Take 25 mg by mouth every 6 (six) hours as needed for nausea or vomiting.   ranolazine (RANEXA) 1000 MG SR tablet Take 1 tablet (1,000 mg total) by mouth 2 (two) times daily.   rosuvastatin (CRESTOR) 20 MG tablet Take 20 mg by mouth daily.   sertraline (ZOLOFT) 100 MG tablet Take 1 tablet (100 mg total) by mouth daily. (Patient taking differently: Take 100 mg by mouth at bedtime.)   vitamin C (ASCORBIC ACID) 500 MG tablet Take 500 mg by mouth daily.   No facility-administered encounter medications on file as of 01/16/2022.   Contacted [name] for General Review Call   Chart Review:  Have there been any documented new, changed, or discontinued medications since last visit? No (If yes, include name, dose, frequency, date) Has there been any documented recent hospitalizations or ED visits since last visit with Clinical Pharmacist? Yes Brief Summary (including medication and/or Diagnosis changes):   Adherence Review:  Does the Clinical Pharmacist Assistant have access to adherence rates? Yes Adherence rates for STAR metric medications (List medication(s)/day supply/ last 2 fill dates). Adherence rates for medications indicated for disease state being reviewed (List medication(s)/day supply/ last 2 fill dates). Does the patient have >5 day gap between last estimated  fill dates for any of the above medications or other medication gaps? No Reason for medication gaps.   Disease State Questions:  Able to connect with Patient? Yes  Did patient have any problems with their health recently? No Note problems and Concerns:  Have you had any admissions or emergency room visits or worsening of your condition(s) since last visit? Yes Details of ED visit, hospital visit and/or worsening condition(s):Admitted to the hospital on 12/07/21 due to Acute on chronic respiratory failure with hypoxia . Discharge date was 12/13/21. Discharged from [facility].    Have you had any visits with new specialists or providers since your last visit? No Explain:  Have you had any new health care problem(s) since your last visit? No New problem(s) reported:  Have you run out of any of your medications since you last spoke with clinical pharmacist? No What caused you to run out of your medications?  Are there any medications you are not taking as prescribed? No, but patient would like to know if is is to suppose to be taking zoloft and lorazepam What  kept you from taking your medications as prescribed?  Are you having any issues or side effects with your medications? Yes Note of issues or side effects:Patient states that every time she takes Ranexa she gets very sick. She states that she get nauseous and throws up  Do you have any other health concerns or questions you want to discuss with your Clinical Pharmacist before your next visit? No Note additional concerns and questions from Patient.  Are there any health concerns that you feel we can do a better job addressing? No Note Patient's response.  Are you having any problems with any of the following since the last visit: (select all that apply)  None  Details:  12. Any falls since last visit? No  Details:  13. Any increased or uncontrolled pain since last visit? No  Details:  14. Next visit Type:  None noted      15. Additional Details? No    Care Gaps: Colonoscopy-12/03/16 Diabetic Foot Exam-08/13/20 Mammogram-NA Ophthalmology-10/31/20 Dexa Scan - NA Annual Well Visit - NA Micro albumin-08/13/20 Hemoglobin A1c- 10/20/21   Star Rating Drugs: None ID   Ethelene Hal Clinical Pharmacist Assistant [phone]

## 2022-01-17 DIAGNOSIS — E1122 Type 2 diabetes mellitus with diabetic chronic kidney disease: Secondary | ICD-10-CM | POA: Diagnosis not present

## 2022-01-17 DIAGNOSIS — J9611 Chronic respiratory failure with hypoxia: Secondary | ICD-10-CM | POA: Diagnosis not present

## 2022-01-17 DIAGNOSIS — J441 Chronic obstructive pulmonary disease with (acute) exacerbation: Secondary | ICD-10-CM | POA: Diagnosis not present

## 2022-01-17 DIAGNOSIS — E1159 Type 2 diabetes mellitus with other circulatory complications: Secondary | ICD-10-CM | POA: Diagnosis not present

## 2022-01-17 DIAGNOSIS — N189 Chronic kidney disease, unspecified: Secondary | ICD-10-CM | POA: Diagnosis not present

## 2022-01-17 DIAGNOSIS — I152 Hypertension secondary to endocrine disorders: Secondary | ICD-10-CM | POA: Diagnosis not present

## 2022-01-20 NOTE — Chronic Care Management (AMB) (Signed)
Patient called and stated that she only has 6 more tablets of oxycodone left. She stated that she was given 5 mg tabs when she got a refill because they were out of 10 mg, so she has been taking 2 tabs daily. She wants to know if she will be able to get enough to last her to the end of the month. Also she states that she is out of insulin needles and needs a new prescription sent to the pharmacy.  [place] Pharmacist Assistant [phone]

## 2022-01-22 ENCOUNTER — Other Ambulatory Visit: Payer: Self-pay | Admitting: Internal Medicine

## 2022-01-22 DIAGNOSIS — M1711 Unilateral primary osteoarthritis, right knee: Secondary | ICD-10-CM

## 2022-01-22 DIAGNOSIS — Z515 Encounter for palliative care: Secondary | ICD-10-CM

## 2022-01-22 DIAGNOSIS — M4726 Other spondylosis with radiculopathy, lumbar region: Secondary | ICD-10-CM

## 2022-01-22 DIAGNOSIS — M47816 Spondylosis without myelopathy or radiculopathy, lumbar region: Secondary | ICD-10-CM

## 2022-01-22 MED ORDER — OXYCODONE HCL 10 MG PO TABS: 10.0000 mg | ORAL_TABLET | Freq: Three times a day (TID) | ORAL | 0 refills | Status: DC | PRN

## 2022-01-28 DIAGNOSIS — I152 Hypertension secondary to endocrine disorders: Secondary | ICD-10-CM | POA: Diagnosis not present

## 2022-01-28 DIAGNOSIS — J9611 Chronic respiratory failure with hypoxia: Secondary | ICD-10-CM | POA: Diagnosis not present

## 2022-01-28 DIAGNOSIS — E1159 Type 2 diabetes mellitus with other circulatory complications: Secondary | ICD-10-CM | POA: Diagnosis not present

## 2022-01-28 DIAGNOSIS — J441 Chronic obstructive pulmonary disease with (acute) exacerbation: Secondary | ICD-10-CM | POA: Diagnosis not present

## 2022-01-28 DIAGNOSIS — N189 Chronic kidney disease, unspecified: Secondary | ICD-10-CM | POA: Diagnosis not present

## 2022-01-28 DIAGNOSIS — E1122 Type 2 diabetes mellitus with diabetic chronic kidney disease: Secondary | ICD-10-CM | POA: Diagnosis not present

## 2022-01-28 DIAGNOSIS — N390 Urinary tract infection, site not specified: Secondary | ICD-10-CM | POA: Diagnosis not present

## 2022-01-30 ENCOUNTER — Telehealth: Payer: Self-pay | Admitting: Internal Medicine

## 2022-01-30 DIAGNOSIS — E1142 Type 2 diabetes mellitus with diabetic polyneuropathy: Secondary | ICD-10-CM

## 2022-01-30 MED ORDER — PEN NEEDLES 32G X 4 MM MISC: 0 refills | Status: DC

## 2022-01-30 NOTE — Telephone Encounter (Signed)
Pt called in requesting rx for insulin needles.  Pt also wants to know if she should continue taking vitamin c '500mg'$ .  [id number] [place] [place] Phone:  [phone] Fax:  [phone]

## 2022-02-01 ENCOUNTER — Other Ambulatory Visit: Payer: Self-pay | Admitting: Internal Medicine

## 2022-02-01 DIAGNOSIS — R079 Chest pain, unspecified: Secondary | ICD-10-CM | POA: Diagnosis not present

## 2022-02-01 DIAGNOSIS — E1142 Type 2 diabetes mellitus with diabetic polyneuropathy: Secondary | ICD-10-CM

## 2022-02-01 DIAGNOSIS — I447 Left bundle-branch block, unspecified: Secondary | ICD-10-CM | POA: Diagnosis not present

## 2022-02-01 DIAGNOSIS — R0789 Other chest pain: Secondary | ICD-10-CM | POA: Diagnosis not present

## 2022-02-01 MED ORDER — PEN NEEDLES 32G X 4 MM MISC: 1 refills | Status: DC

## 2022-02-02 ENCOUNTER — Encounter: Payer: Self-pay | Admitting: Internal Medicine

## 2022-02-02 ENCOUNTER — Ambulatory Visit (INDEPENDENT_AMBULATORY_CARE_PROVIDER_SITE_OTHER): Payer: Medicare Other | Admitting: Internal Medicine

## 2022-02-02 ENCOUNTER — Ambulatory Visit (INDEPENDENT_AMBULATORY_CARE_PROVIDER_SITE_OTHER): Payer: Medicare Other

## 2022-02-02 VITALS — BP 128/84 | HR 87 | Temp 98.0°F | Resp 16 | Ht 61.0 in | Wt 192.0 lb

## 2022-02-02 DIAGNOSIS — I10 Essential (primary) hypertension: Secondary | ICD-10-CM | POA: Diagnosis not present

## 2022-02-02 DIAGNOSIS — J849 Interstitial pulmonary disease, unspecified: Secondary | ICD-10-CM | POA: Diagnosis not present

## 2022-02-02 DIAGNOSIS — E1142 Type 2 diabetes mellitus with diabetic polyneuropathy: Secondary | ICD-10-CM | POA: Diagnosis not present

## 2022-02-02 DIAGNOSIS — E785 Hyperlipidemia, unspecified: Secondary | ICD-10-CM | POA: Diagnosis not present

## 2022-02-02 DIAGNOSIS — R10813 Right lower quadrant abdominal tenderness: Secondary | ICD-10-CM | POA: Diagnosis not present

## 2022-02-02 LAB — CBC WITH DIFFERENTIAL/PLATELET
Basophils Absolute: 0.1 10*3/uL (ref 0.0–0.1)
Basophils Relative: 0.6 % (ref 0.0–3.0)
Eosinophils Absolute: 0.2 10*3/uL (ref 0.0–0.7)
Eosinophils Relative: 2.2 % (ref 0.0–5.0)
HCT: 37 % (ref 36.0–46.0)
Hemoglobin: 12.2 g/dL (ref 12.0–15.0)
Lymphocytes Relative: 14.6 % (ref 12.0–46.0)
Lymphs Abs: 1.6 10*3/uL (ref 0.7–4.0)
MCHC: 32.9 g/dL (ref 30.0–36.0)
MCV: 88.6 fl (ref 78.0–100.0)
Monocytes Absolute: 0.7 10*3/uL (ref 0.1–1.0)
Monocytes Relative: 6 % (ref 3.0–12.0)
Neutro Abs: 8.6 10*3/uL — ABNORMAL HIGH (ref 1.4–7.7)
Neutrophils Relative %: 76.6 % (ref 43.0–77.0)
Platelets: 234 10*3/uL (ref 150.0–400.0)
RBC: 4.17 Mil/uL (ref 3.87–5.11)
RDW: 21.1 % — ABNORMAL HIGH (ref 11.5–15.5)
WBC: 11.2 10*3/uL — ABNORMAL HIGH (ref 4.0–10.5)

## 2022-02-02 LAB — HEPATIC FUNCTION PANEL
ALT: 7 U/L (ref 0–35)
AST: 12 U/L (ref 0–37)
Albumin: 3.6 g/dL (ref 3.5–5.2)
Alkaline Phosphatase: 130 U/L — ABNORMAL HIGH (ref 39–117)
Bilirubin, Direct: 0.1 mg/dL (ref 0.0–0.3)
Total Bilirubin: 0.5 mg/dL (ref 0.2–1.2)
Total Protein: 7 g/dL (ref 6.0–8.3)

## 2022-02-02 LAB — BASIC METABOLIC PANEL
BUN: 20 mg/dL (ref 6–23)
CO2: 32 mEq/L (ref 19–32)
Calcium: 9.7 mg/dL (ref 8.4–10.5)
Chloride: 97 mEq/L (ref 96–112)
Creatinine, Ser: 0.81 mg/dL (ref 0.40–1.20)
GFR: 76.6 mL/min (ref >60.00)
Glucose, Bld: 174 mg/dL — ABNORMAL HIGH (ref 70–99)
Potassium: 3.6 mEq/L (ref 3.5–5.1)
Sodium: 135 mEq/L (ref 135–145)

## 2022-02-02 LAB — HEMOGLOBIN A1C: Hgb A1c MFr Bld: 7.8 % — ABNORMAL HIGH (ref 4.6–6.5)

## 2022-02-02 LAB — LIPASE: Lipase: 4 U/L — ABNORMAL LOW (ref 11.0–59.0)

## 2022-02-02 LAB — LIPID PANEL
Cholesterol: 127 mg/dL (ref 0–200)
HDL: 66.8 mg/dL (ref >39.00)
LDL Cholesterol: 23 mg/dL (ref 0–99)
NonHDL: 60.14
Total CHOL/HDL Ratio: 2
Triglycerides: 186 mg/dL — ABNORMAL HIGH (ref 0.0–149.0)
VLDL: 37.2 mg/dL (ref 0.0–40.0)

## 2022-02-02 LAB — AMYLASE: Amylase: 27 U/L (ref 27–131)

## 2022-02-02 NOTE — Patient Instructions (Signed)
Abdominal Pain, Adult Pain in the abdomen (abdominal pain) can be caused by many things. Often, abdominal pain is not serious and it gets better with no treatment or by being treated at home. However, sometimes abdominal pain is serious. Your health care provider will ask questions about your medical history and do a physical exam to try to determine the cause of your abdominal pain. Follow these instructions at home: Medicines Take over-the-counter and prescription medicines only as told by your health care provider. Do not take a laxative unless told by your health care provider. General instructions  Watch your condition for any changes. Drink enough fluid to keep your urine pale yellow. Keep all follow-up visits as told by your health care provider. This is important. Contact a health care provider if: Your abdominal pain changes or gets worse. You are not hungry or you lose weight without trying. You are constipated or have diarrhea for more than 2-3 days. You have pain when you urinate or have a bowel movement. Your abdominal pain wakes you up at night. Your pain gets worse with meals, after eating, or with certain foods. You are vomiting and cannot keep anything down. You have a fever. You have blood in your urine. Get help right away if: Your pain does not go away as soon as your health care provider told you to expect. You cannot stop vomiting. Your pain is only in areas of the abdomen, such as the right side or the left lower portion of the abdomen. Pain on the right side could be caused by appendicitis. You have bloody or black stools, or stools that look like tar. You have severe pain, cramping, or bloating in your abdomen. You have signs of dehydration, such as: Dark urine, very little urine, or no urine. Cracked lips. Dry mouth. Sunken eyes. Sleepiness. Weakness. You have trouble breathing or chest pain. Summary Often, abdominal pain is not serious and it gets  better with no treatment or by being treated at home. However, sometimes abdominal pain is serious. Watch your condition for any changes. Take over-the-counter and prescription medicines only as told by your health care provider. Contact a health care provider if your abdominal pain changes or gets worse. Get help right away if you have severe pain, cramping, or bloating in your abdomen. This information is not intended to replace advice given to you by your health care provider. Make sure you discuss any questions you have with your health care provider. Document Revised: 09/22/2019 Document Reviewed: 12/12/2018 Elsevier Patient Education  2023 [employer]  

## 2022-02-02 NOTE — Progress Notes (Unsigned)
Subjective:  Patient ID: Katherine [name], female    DOB: 09-02-1955  Age: 66 y.o. MRN: [id number]  CC: Abdominal Pain, Hyperlipidemia, Hypertension, Diabetes, and Anemia   HPI Amyri Frenz presents for f/up -  She complains of a one week hx of crampy RLQ abd pain with N/V.  Outpatient Medications Prior to Visit  Medication Sig Dispense Refill   albuterol (VENTOLIN HFA) 108 (90 Base) MCG/ACT inhaler TAKE 2 PUFFS BY MOUTH EVERY 6 HOURS AS NEEDED FOR WHEEZE OR SHORTNESS OF BREATH (Patient taking differently: 2 puffs every 6 (six) hours as needed for wheezing or shortness of breath.) 18 each 3   apixaban (ELIQUIS) 5 MG TABS tablet Take 1 tablet (5 mg total) by mouth 2 (two) times daily. 60 tablet 2   arformoterol (BROVANA) 15 MCG/2ML NEBU Take 2 mLs (15 mcg total) by nebulization 2 (two) times daily. 120 mL 12   azaTHIOprine (IMURAN) 50 MG tablet Take 2 & 1/2 tablets (125 mg) by mouth daily at 3pm (Patient taking differently: Take 125 mg by mouth daily.) 75 tablet 2   bisacodyl (DULCOLAX) 5 MG EC tablet Take 1 tablet (5 mg total) by mouth daily as needed for moderate constipation. 30 tablet 0   budesonide (PULMICORT) 0.5 MG/2ML nebulizer solution Take 2 mLs (0.5 mg total) by nebulization 2 (two) times daily. (Patient taking differently: Take 2 mLs by nebulization 2 (two) times daily as needed (wheezing).) 360 mL 1   calcitRIOL (ROCALTROL) 0.25 MCG capsule Take 0.25 mcg by mouth See admin instructions. Takes every 3 days.     clopidogrel (PLAVIX) 75 MG tablet Take 75 mg by mouth daily.     cyclobenzaprine (FLEXERIL) 10 MG tablet Take 1 tablet (10 mg total) by mouth 2 (two) times daily as needed for muscle spasms. 180 tablet 0   Ensure Max Protein (ENSURE MAX PROTEIN) LIQD Take 330 mLs (11 oz total) by mouth 2 (two) times daily.     fluticasone (FLONASE) 50 MCG/ACT nasal spray PLACE 2 SPRAYS INTO BOTH NOSTRILS 2 TIMES DAILY. (Patient taking differently: 2 sprays in the morning and at  bedtime.) 96 mL 0   furosemide (LASIX) 40 MG tablet Take 40 mg by mouth daily.     gabapentin (NEURONTIN) 100 MG capsule Take 1 capsule (100 mg total) by mouth at bedtime. 30 capsule 1   glimepiride (AMARYL) 4 MG tablet Take 4 mg by mouth daily with breakfast.     insulin aspart (NOVOLOG FLEXPEN) 100 UNIT/ML FlexPen Inject 4-12 Units into the skin 3 (three) times daily with meals. Sliding scale.     Insulin Glargine (BASAGLAR KWIKPEN) 100 UNIT/ML Inject 20 Units into the skin daily.     Insulin Pen Needle (PEN NEEDLES) 32G X 4 MM MISC Use as needed to administer insulin. E11.9 100 each 1   isosorbide mononitrate (IMDUR) 60 MG 24 hr tablet Take 1 tablet (60 mg total) by mouth daily. 90 tablet 1   lamoTRIgine (LAMICTAL) 25 MG tablet TAKE 1 TABLET BY MOUTH EVERYDAY AT BEDTIME (Patient taking differently: Take 25 mg by mouth daily.) 90 tablet 1   LORazepam (ATIVAN) 2 MG/ML concentrated solution Place 0.5 mLs (1 mg total) under the tongue every 8 (eight) hours as needed for anxiety, seizure, sedation or sleep (distress). 270 mL 1   metFORMIN (GLUCOPHAGE) 500 MG tablet Take 500 mg by mouth 2 (two) times daily.     metoprolol tartrate (LOPRESSOR) 25 MG tablet Take 1 tablet (25 mg total) by mouth  3 (three) times daily. 90 tablet 2   montelukast (SINGULAIR) 10 MG tablet TAKE 1 TABLET BY MOUTH EVERYDAY AT BEDTIME (Patient taking differently: Take 10 mg by mouth at bedtime.) 90 tablet 1   Multiple Vitamin (MULTIVITAMIN WITH MINERALS) TABS tablet Take 1 tablet by mouth daily.     nitroGLYCERIN (NITROSTAT) 0.4 MG SL tablet Place 0.4 mg under the tongue every 5 (five) minutes as needed for chest pain.     OLANZapine (ZYPREXA) 5 MG tablet Take 5 mg by mouth at bedtime.     Oxycodone HCl 10 MG TABS Take 1 tablet (10 mg total) by mouth every 8 (eight) hours as needed. 90 tablet 0   OXYGEN Inhale 4 L into the lungs continuous.     pantoprazole (PROTONIX) 40 MG tablet TAKE 1 TABLET BY MOUTH EVERY DAY (Patient taking  differently: Take 40 mg by mouth daily.) 30 tablet 3   predniSONE (DELTASONE) 5 MG tablet Take 5 mg by mouth daily with breakfast.     promethazine (PHENERGAN) 25 MG tablet Take 25 mg by mouth every 6 (six) hours as needed for nausea or vomiting.     rosuvastatin (CRESTOR) 20 MG tablet Take 20 mg by mouth daily.     sertraline (ZOLOFT) 100 MG tablet Take 1 tablet (100 mg total) by mouth daily. (Patient taking differently: Take 100 mg by mouth at bedtime.) 90 tablet 2   vitamin C (ASCORBIC ACID) 500 MG tablet Take 500 mg by mouth daily.     ranolazine (RANEXA) 1000 MG SR tablet Take 1 tablet (1,000 mg total) by mouth 2 (two) times daily. 60 tablet 0   No facility-administered medications prior to visit.    ROS Review of Systems  Constitutional: Negative.   HENT: Negative.  Negative for trouble swallowing.   Eyes: Negative.   Respiratory:  Positive for shortness of breath. Negative for choking and chest tightness.   Cardiovascular:  Positive for chest pain. Negative for palpitations and leg swelling.  Gastrointestinal:  Positive for nausea and vomiting. Negative for blood in stool, constipation and diarrhea.  Endocrine: Negative.   Genitourinary: Negative.  Negative for difficulty urinating and dysuria.  Musculoskeletal: Negative.   Skin: Negative.   Neurological:  Negative for dizziness, weakness, light-headedness and headaches.  Hematological:  Negative for adenopathy. Does not bruise/bleed easily.  Psychiatric/Behavioral: Negative.      Objective:  BP 128/84 (BP Location: Left Arm, Patient Position: Sitting, Cuff Size: Large)   Pulse 87   Temp 98 F (36.7 C) (Oral)   Resp 16   Ht '5\' 1"'$  (1.549 m)   Wt 192 lb (87.1 kg) Comment: declined  SpO2 92%   BMI 36.28 kg/m   BP Readings from Last 3 Encounters:  02/02/22 128/84  12/13/21 (!) 108/55  11/22/21 108/69    Wt Readings from Last 3 Encounters:  02/02/22 192 lb (87.1 kg)  12/12/21 196 lb 10.4 oz (89.2 kg)  11/22/21 184  lb 11.9 oz (83.8 kg)    Physical Exam Vitals reviewed.  Constitutional:      General: She is not in acute distress.    Appearance: She is ill-appearing (O2 and in a wheelchair). She is not toxic-appearing or diaphoretic.  HENT:     Mouth/Throat:     Mouth: Mucous membranes are moist.  Eyes:     General: No scleral icterus.    Conjunctiva/sclera: Conjunctivae normal.  Cardiovascular:     Rate and Rhythm: Normal rate and regular rhythm.  Heart sounds: No murmur heard. Pulmonary:     Effort: Pulmonary effort is normal.     Breath sounds: No stridor. No wheezing, rhonchi or rales.  Abdominal:     General: Abdomen is protuberant. Bowel sounds are normal.     Palpations: There is no hepatomegaly, splenomegaly or mass.     Tenderness: There is abdominal tenderness in the right lower quadrant. There is no guarding or rebound.     Hernia: No hernia is present.  Musculoskeletal:        General: Normal range of motion.     Cervical back: Neck supple.     Right lower leg: No edema.     Left lower leg: No edema.  Lymphadenopathy:     Cervical: No cervical adenopathy.  Skin:    General: Skin is warm and dry.  Neurological:     General: No focal deficit present.     Mental Status: She is alert. Mental status is at baseline.  Psychiatric:        Mood and Affect: Mood normal.        Behavior: Behavior normal.     Lab Results  Component Value Date   WBC 12.5 (H) 12/11/2021   HGB 9.5 (L) 12/11/2021   HCT 31.0 (L) 12/11/2021   PLT 306 12/11/2021   GLUCOSE 169 (H) 12/11/2021   CHOL 99 02/20/2021   TRIG 165 (H) 02/20/2021   HDL 51 02/20/2021   LDLCALC 15 02/20/2021   ALT 9 12/08/2021   AST 28 12/08/2021   NA 133 (L) 12/11/2021   K 4.2 12/11/2021   CL 96 (L) 12/11/2021   CREATININE 1.13 (H) 12/11/2021   BUN 23 12/11/2021   CO2 29 12/11/2021   TSH 1.872 10/19/2021   INR 1.2 01/31/2021   HGBA1C 7.8 (H) 10/20/2021   MICROALBUR <0.7 08/13/2020    ECHOCARDIOGRAM  COMPLETE  Result Date: 12/09/2021    ECHOCARDIOGRAM REPORT   Patient Name:   OLEDA BORSKI Date of Exam: 12/08/2021 Medical Rec #:  [id number]          Height:       61.0 in Accession #:    [id number]         Weight:       180.0 lb Date of Birth:  03-12-1956          BSA:          1.806 m Patient Age:    63 years           BP:           91/61 mmHg Patient Gender: F                  HR:           90 bpm. Exam Location:  Inpatient Procedure: 2D Echo, Cardiac Doppler, Color Doppler and Intracardiac            Opacification Agent Indications:    NSTEMI I21.4  History:        Patient has prior history of Echocardiogram examinations, most                 recent 10/02/2021. Previous Myocardial Infarction and CAD, Stroke                 and COPD, Arrythmias:Atrial Fibrillation; Risk                 Factors:Hypertension, Diabetes, Dyslipidemia and Sleep Apnea.  Sonographer:  Bernadene Person RDCS Referring Phys: [id number] [place]  1. Left ventricular ejection fraction, by estimation, is 45 to 50%. The left ventricle has mildly decreased function. The left ventricle demonstrates regional wall motion abnormalities (see scoring diagram/findings for description). There is mild concentric left ventricular hypertrophy. Left ventricular diastolic parameters are consistent with Grade III diastolic dysfunction (restrictive). There is akinesis of the left ventricular, mid inferoseptal wall. There is akinesis of the left ventricular,  apical septal wall.  2. Right ventricular systolic function is normal. The right ventricular size is normal. There is normal pulmonary artery systolic pressure. The estimated right ventricular systolic pressure is 16.6 mmHg.  3. Left atrial size was mildly dilated.  4. The mitral valve is normal in structure. Trivial mitral valve regurgitation. Mild to moderate mitral stenosis. Moderate mitral annular calcification.  5. The aortic valve was not well visualized. There is moderate  calcification of the aortic valve. Aortic valve regurgitation is trivial. Mild aortic valve stenosis. Aortic valve area, by VTI measures 1.34 cm. Aortic valve mean gradient measures 10.0 mmHg. Aortic valve Vmax measures 2.20 m/s. LV Stroke Volume index 30, DVI 0.39.  6. The inferior vena cava is normal in size with greater than 50% respiratory variability, suggesting right atrial pressure of 3 mmHg.  7. IT Issues from 12/08/21. Study resent 12/09/21 for read and review. Comparison(s): Similar to prior. FINDINGS  Left Ventricle: Left ventricular ejection fraction, by estimation, is 45 to 50%. The left ventricle has mildly decreased function. The left ventricle demonstrates regional wall motion abnormalities. Definity contrast agent was given IV to delineate the left ventricular endocardial borders. The left ventricular internal cavity size was normal in size. There is mild concentric left ventricular hypertrophy. Left ventricular diastolic function could not be evaluated due to atrial fibrillation. Left ventricular diastolic parameters are consistent with Grade III diastolic dysfunction (restrictive).  LV Wall Scoring: The apex is dyskinetic. The inferior septum is akinetic. Right Ventricle: The right ventricular size is normal. No increase in right ventricular wall thickness. Right ventricular systolic function is normal. There is normal pulmonary artery systolic pressure. The tricuspid regurgitant velocity is 2.75 m/s, and  with an assumed right atrial pressure of 3 mmHg, the estimated right ventricular systolic pressure is 06.3 mmHg. Left Atrium: Left atrial size was mildly dilated. Right Atrium: Right atrial size was normal in size. Pericardium: There is no evidence of pericardial effusion. Mitral Valve: The mitral valve is normal in structure. Moderate mitral annular calcification. Trivial mitral valve regurgitation. Mild to moderate mitral valve stenosis. Tricuspid Valve: The tricuspid valve is normal in  structure. Tricuspid valve regurgitation is mild . No evidence of tricuspid stenosis. Aortic Valve: The aortic valve was not well visualized. There is moderate calcification of the aortic valve. Aortic valve regurgitation is trivial. Mild aortic stenosis is present. Aortic valve mean gradient measures 10.0 mmHg. Aortic valve peak gradient  measures 19.4 mmHg. Aortic valve area, by VTI measures 1.34 cm. Pulmonic Valve: The pulmonic valve was normal in structure. Pulmonic valve regurgitation is mild. No evidence of pulmonic stenosis. Aorta: The aortic root is normal in size and structure. Venous: The inferior vena cava is normal in size with greater than 50% respiratory variability, suggesting right atrial pressure of 3 mmHg. IAS/Shunts: No atrial level shunt detected by color flow Doppler.  LEFT VENTRICLE PLAX 2D LVIDd:         4.30 cm LVIDs:         3.30 cm LV PW:  1.20 cm LV IVS:        1.20 cm LVOT diam:     2.10 cm LV SV:         54 LV SV Index:   30 LVOT Area:     3.46 cm  LV Volumes (MOD) LV vol d, MOD A2C: 101.0 ml LV vol d, MOD A4C: 66.9 ml LV vol s, MOD A2C: 59.4 ml LV vol s, MOD A4C: 44.2 ml LV SV MOD A2C:     41.6 ml LV SV MOD A4C:     66.9 ml LV SV MOD BP:      31.7 ml RIGHT VENTRICLE TAPSE (M-mode): 1.6 cm LEFT ATRIUM             Index        RIGHT ATRIUM           Index LA diam:        4.20 cm 2.33 cm/m   RA Area:     14.30 cm LA Vol (A2C):   63.4 ml 35.10 ml/m  RA Volume:   33.10 ml  18.33 ml/m LA Vol (A4C):   58.6 ml 32.45 ml/m LA Biplane Vol: 64.2 ml 35.55 ml/m  AORTIC VALVE AV Area (Vmax):    1.47 cm AV Area (Vmean):   1.39 cm AV Area (VTI):     1.34 cm AV Vmax:           220.33 cm/s AV Vmean:          148.333 cm/s AV VTI:            0.405 m AV Peak Grad:      19.4 mmHg AV Mean Grad:      10.0 mmHg LVOT Vmax:         93.30 cm/s LVOT Vmean:        59.533 cm/s LVOT VTI:          0.156 m LVOT/AV VTI ratio: 0.39  AORTA Ao Root diam: 3.40 cm Ao Asc diam:  3.10 cm TRICUSPID VALVE TR Peak  grad:   30.2 mmHg TR Vmax:        275.00 cm/s  SHUNTS Systemic VTI:  0.16 m Systemic Diam: 2.10 cm Rudean Haskell MD Electronically signed by Rudean Haskell MD Signature Date/Time: 12/09/2021/5:07:12 PM    Final    DG Chest Port 1 View  Result Date: 12/07/2021 CLINICAL DATA:  Shortness of breath EXAM: PORTABLE CHEST 1 VIEW COMPARISON:  11/21/2021 FINDINGS: Unchanged mild interstitial pulmonary edema and cardiomegaly. No pneumothorax or sizable pleural effusion. Remote median sternotomy and CABG. IMPRESSION: Unchanged cardiomegaly and mild interstitial pulmonary edema. Electronically Signed   By: Ulyses Jarred M.D.   On: 12/07/2021 21:35   DG ABD ACUTE 2+V W 1V CHEST  Result Date: 02/02/2022 CLINICAL DATA:  Acute generalized abdominal pain. EXAM: DG ABDOMEN ACUTE WITH 1 VIEW CHEST COMPARISON:  December 07, 2021. FINDINGS: There is no evidence of dilated bowel loops or free intraperitoneal air. Lap band device is noted. Status post cholecystectomy. Heart size and mediastinal contours are within normal limits. Stable interstitial densities are noted bilaterally which may represent scarring, edema or inflammation. IMPRESSION: No abnormal bowel dilatation is noted. Stable bilateral pulmonary interstitial densities are noted as described above. Aortic Atherosclerosis (ICD10-I70.0). Electronically Signed   By: Marijo Conception M.D.   On: 02/02/2022 15:15     Assessment & Plan:   Oliviarose was seen today for abdominal pain, hyperlipidemia, hypertension, diabetes and anemia.  Diagnoses  and all orders for this visit:  Dyslipidemia, goal LDL below 70 -     Lipid panel; Future -     Hepatic function panel; Future -     Hepatic function panel -     Lipid panel  Right lower quadrant abdominal tenderness without rebound tenderness -     DG ABD ACUTE 2+V W 1V CHEST; Future -     Lipase; Future -     CBC with Differential/Platelet; Future -     Hepatic function panel; Future -     Urinalysis, Routine w  reflex microscopic; Future -     Amylase; Future -     Amylase -     Urinalysis, Routine w reflex microscopic -     Hepatic function panel -     CBC with Differential/Platelet -     Lipase  Essential hypertension -     Basic metabolic panel; Future -     CBC with Differential/Platelet; Future -     Urinalysis, Routine w reflex microscopic; Future -     Urinalysis, Routine w reflex microscopic -     CBC with Differential/Platelet -     Basic metabolic panel  Type 2 diabetes mellitus with peripheral neuropathy (HCC) -     Hemoglobin A1c; Future -     Hemoglobin A1c   I am having Christi L. Curt maintain her budesonide, arformoterol, OXYGEN, albuterol, sertraline, fluticasone, lamoTRIgine, gabapentin, montelukast, OLANZapine, bisacodyl, pantoprazole, Basaglar KwikPen, clopidogrel, furosemide, nitroGLYCERIN, NovoLOG FlexPen, predniSONE, promethazine, azaTHIOprine, apixaban, rosuvastatin, glimepiride, vitamin C, calcitRIOL, LORazepam, metFORMIN, metoprolol tartrate, Ensure Max Protein, multivitamin with minerals, cyclobenzaprine, ranolazine, isosorbide mononitrate, Oxycodone HCl, and Pen Needles.  No orders of the defined types were placed in this encounter.    Follow-up: Return if symptoms worsen or fail to improve.  Scarlette Calico, MD

## 2022-02-03 ENCOUNTER — Other Ambulatory Visit: Payer: Self-pay | Admitting: Internal Medicine

## 2022-02-03 LAB — URINALYSIS, ROUTINE W REFLEX MICROSCOPIC
Bilirubin Urine: NEGATIVE
Hgb urine dipstick: NEGATIVE
Ketones, ur: NEGATIVE
Leukocytes,Ua: NEGATIVE
Nitrite: NEGATIVE
RBC / HPF: NONE SEEN (ref 0–?)
Specific Gravity, Urine: 1.02 (ref 1.000–1.030)
Total Protein, Urine: NEGATIVE
Urine Glucose: NEGATIVE
Urobilinogen, UA: 0.2 (ref 0.0–1.0)
pH: 5.5 (ref 5.0–8.0)

## 2022-02-04 ENCOUNTER — Other Ambulatory Visit: Payer: Self-pay | Admitting: Internal Medicine

## 2022-02-04 ENCOUNTER — Telehealth: Payer: Self-pay | Admitting: Cardiology

## 2022-02-04 DIAGNOSIS — I152 Hypertension secondary to endocrine disorders: Secondary | ICD-10-CM | POA: Diagnosis not present

## 2022-02-04 DIAGNOSIS — J9611 Chronic respiratory failure with hypoxia: Secondary | ICD-10-CM | POA: Diagnosis not present

## 2022-02-04 DIAGNOSIS — I2510 Atherosclerotic heart disease of native coronary artery without angina pectoris: Secondary | ICD-10-CM

## 2022-02-04 DIAGNOSIS — E1159 Type 2 diabetes mellitus with other circulatory complications: Secondary | ICD-10-CM | POA: Diagnosis not present

## 2022-02-04 DIAGNOSIS — J441 Chronic obstructive pulmonary disease with (acute) exacerbation: Secondary | ICD-10-CM | POA: Diagnosis not present

## 2022-02-04 DIAGNOSIS — N189 Chronic kidney disease, unspecified: Secondary | ICD-10-CM | POA: Diagnosis not present

## 2022-02-04 DIAGNOSIS — E1122 Type 2 diabetes mellitus with diabetic chronic kidney disease: Secondary | ICD-10-CM | POA: Diagnosis not present

## 2022-02-04 NOTE — Telephone Encounter (Signed)
Pt c/o medication issue:  1. Name of Medication: ranolazine (RANEXA) 1000 MG SR tablet  2. How are you currently taking this medication (dosage and times per day)? As prescribed  3. Are you having a reaction (difficulty breathing--STAT)?   4. What is your medication issue? Patient states she stop taking this medication a week ago because she can no longer stomach it. She states it makes her nauseous with vomiting. Pt is requesting a call back to find out if there is something else she can take in replace of this medication.

## 2022-02-04 NOTE — Telephone Encounter (Signed)
Called pt to clarify it was this medication that was causing her symptoms. She states "I stopped I for a few days and it it cleared up. When I started taking it again it started again.  Also let him know I've had to use 1-2 Nitro a day for my bad chest pain. I just wanted to let him know."

## 2022-02-05 ENCOUNTER — Other Ambulatory Visit: Payer: Self-pay | Admitting: Emergency Medicine

## 2022-02-05 DIAGNOSIS — E1159 Type 2 diabetes mellitus with other circulatory complications: Secondary | ICD-10-CM | POA: Diagnosis not present

## 2022-02-05 DIAGNOSIS — J441 Chronic obstructive pulmonary disease with (acute) exacerbation: Secondary | ICD-10-CM | POA: Diagnosis not present

## 2022-02-05 DIAGNOSIS — I152 Hypertension secondary to endocrine disorders: Secondary | ICD-10-CM | POA: Diagnosis not present

## 2022-02-05 DIAGNOSIS — N189 Chronic kidney disease, unspecified: Secondary | ICD-10-CM | POA: Diagnosis not present

## 2022-02-05 DIAGNOSIS — E1122 Type 2 diabetes mellitus with diabetic chronic kidney disease: Secondary | ICD-10-CM | POA: Diagnosis not present

## 2022-02-05 DIAGNOSIS — J9611 Chronic respiratory failure with hypoxia: Secondary | ICD-10-CM | POA: Diagnosis not present

## 2022-02-10 DIAGNOSIS — J9611 Chronic respiratory failure with hypoxia: Secondary | ICD-10-CM | POA: Diagnosis not present

## 2022-02-10 DIAGNOSIS — N189 Chronic kidney disease, unspecified: Secondary | ICD-10-CM | POA: Diagnosis not present

## 2022-02-10 DIAGNOSIS — I152 Hypertension secondary to endocrine disorders: Secondary | ICD-10-CM | POA: Diagnosis not present

## 2022-02-10 DIAGNOSIS — J441 Chronic obstructive pulmonary disease with (acute) exacerbation: Secondary | ICD-10-CM | POA: Diagnosis not present

## 2022-02-10 DIAGNOSIS — E1159 Type 2 diabetes mellitus with other circulatory complications: Secondary | ICD-10-CM | POA: Diagnosis not present

## 2022-02-10 DIAGNOSIS — E1122 Type 2 diabetes mellitus with diabetic chronic kidney disease: Secondary | ICD-10-CM | POA: Diagnosis not present

## 2022-02-12 IMAGING — CT CT HEAD W/O CM
4 series · 17 of 47 positions shown, 19 images · non-contrast
Comparison: 04/06/2017

CLINICAL DATA: Delirium and head trauma.

EXAM:
CT HEAD WITHOUT CONTRAST
TECHNIQUE: Contiguous axial images were obtained from the base of the skull
through the vertex without intravenous contrast.

[Series 3: head wo · axial · 0.39mm/px · z∈[-192,-66]mm · 7 of 35 slices shown, 9 images]
[im 5/35  brain]
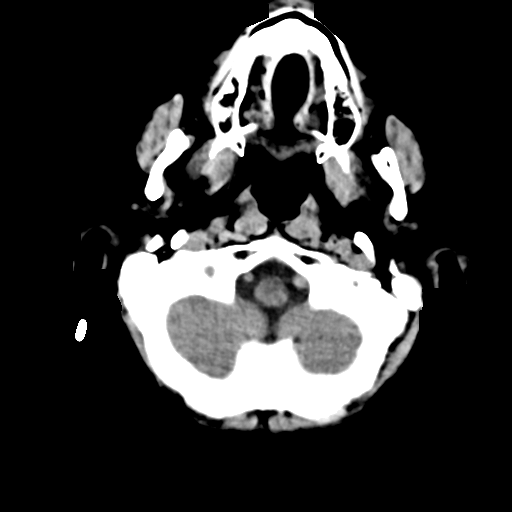
[im 5/35  bone]
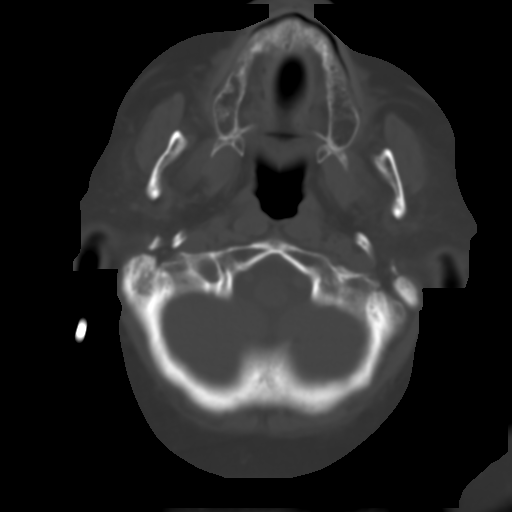
[im 9/35  brain]
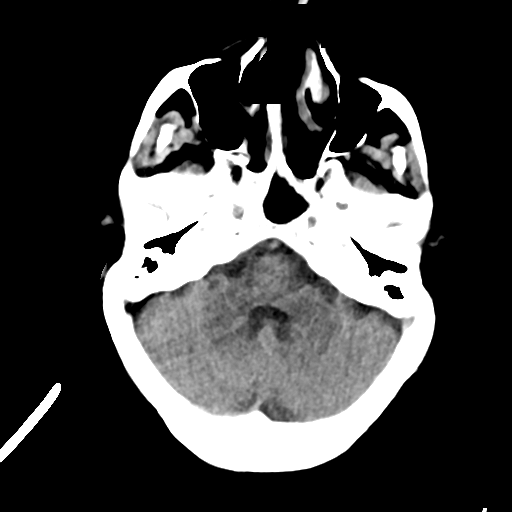
[im 13/35  brain]
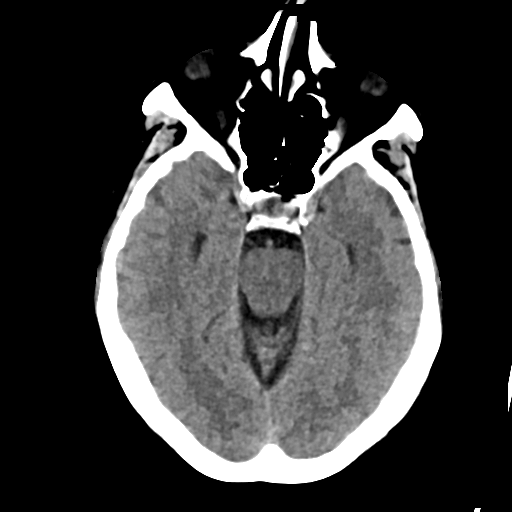
[im 18/35  brain]
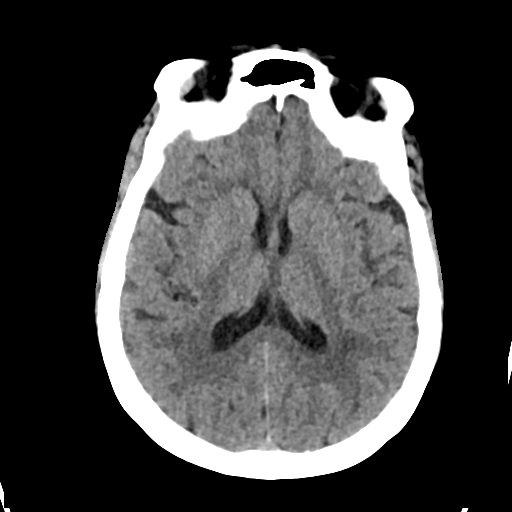
[im 22/35  brain]
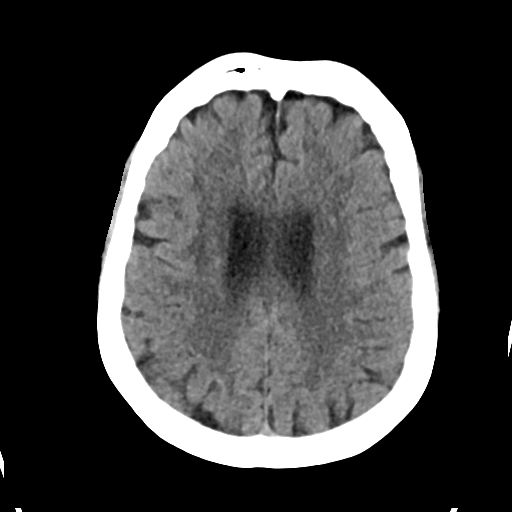
[im 22/35  bone]
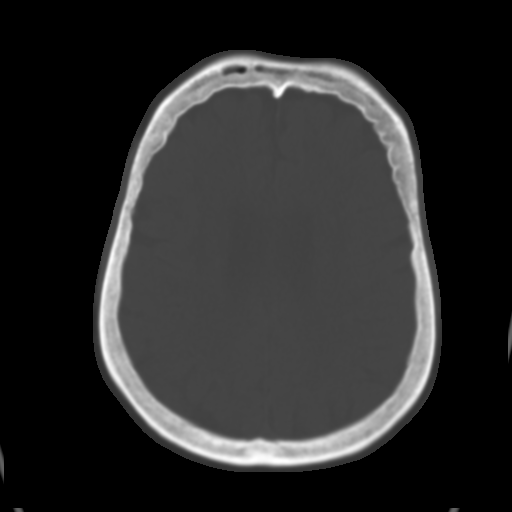
[im 26/35  brain]
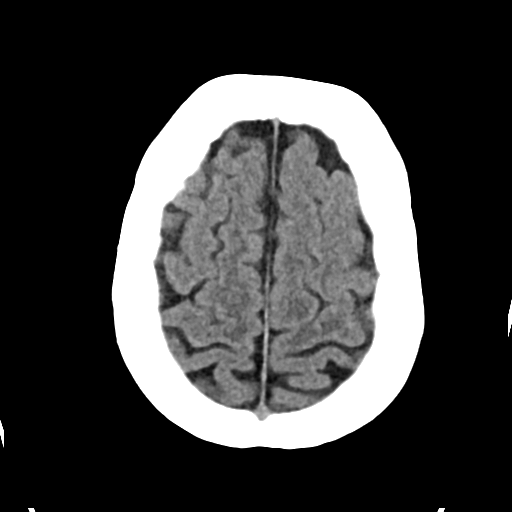
[im 30/35  brain]
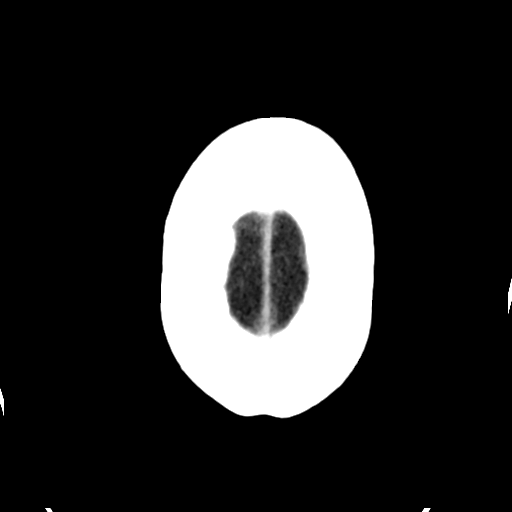

[Series 4: head bone · axial · 0.39mm/px · z∈[-196,-136]mm · 4 of 87 slices shown]
[im 9/87  bone]
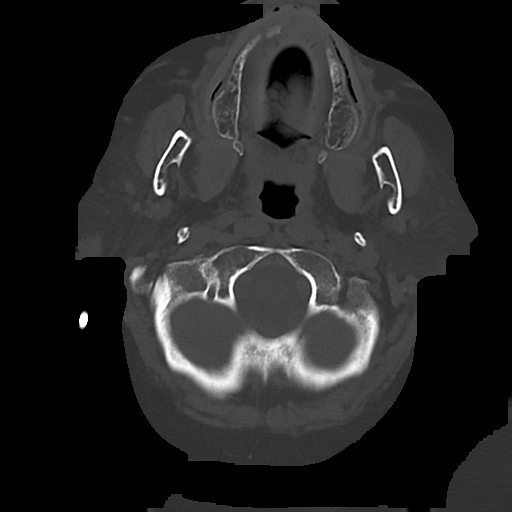
[im 18/87  bone]
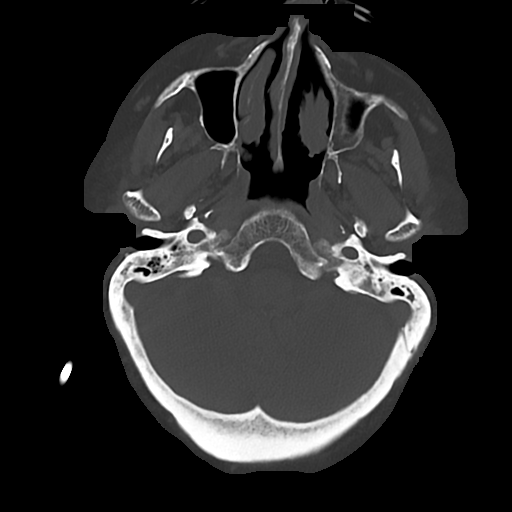
[im 26/87  bone]
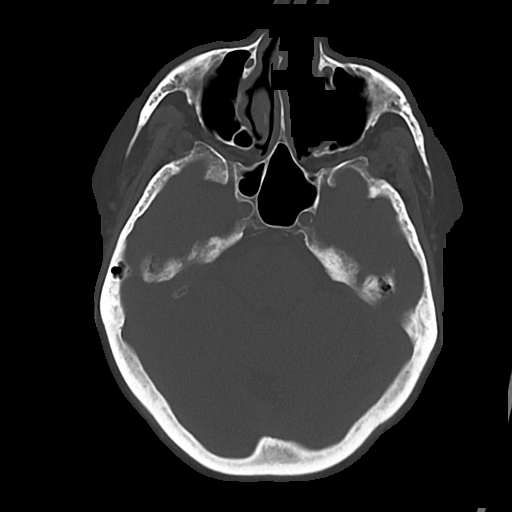
[im 39/87  bone]
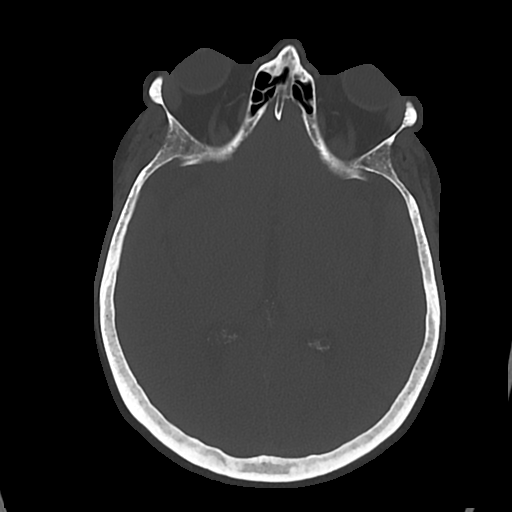

[Series 5: cor soft · coronal · 0.34mm/px · 3 of 69 slices shown]
[im 23/69  brain]
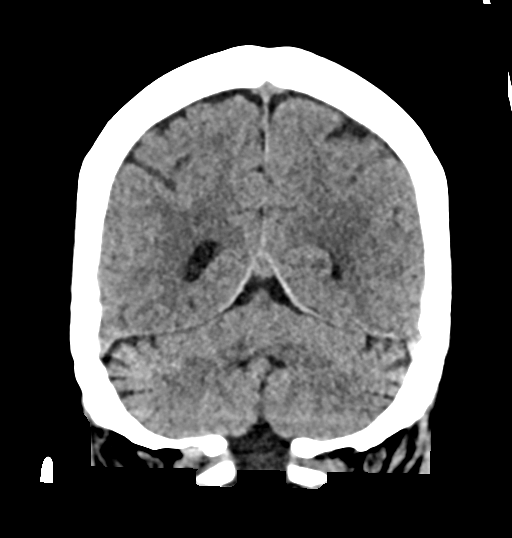
[im 31/69  brain]
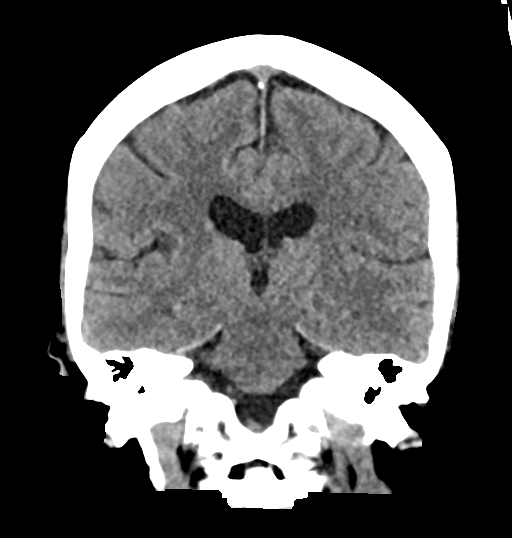
[im 38/69  brain]
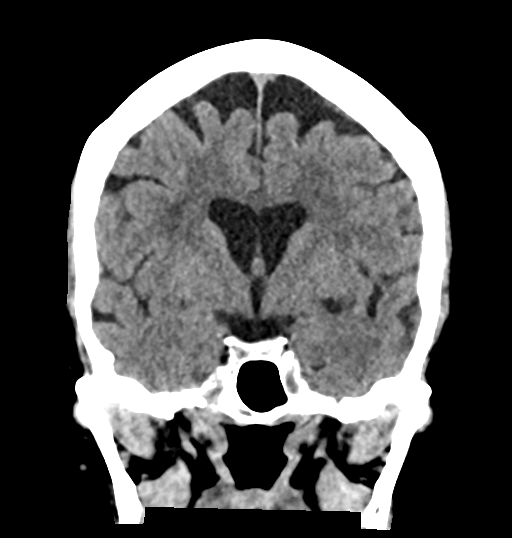

[Series 6: sag soft · sagittal · 0.36mm/px · 3 of 59 slices shown]
[im 20/59  brain]
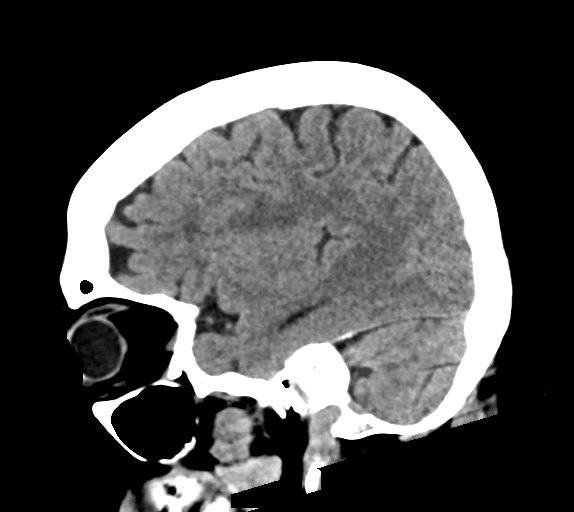
[im 30/59  brain]
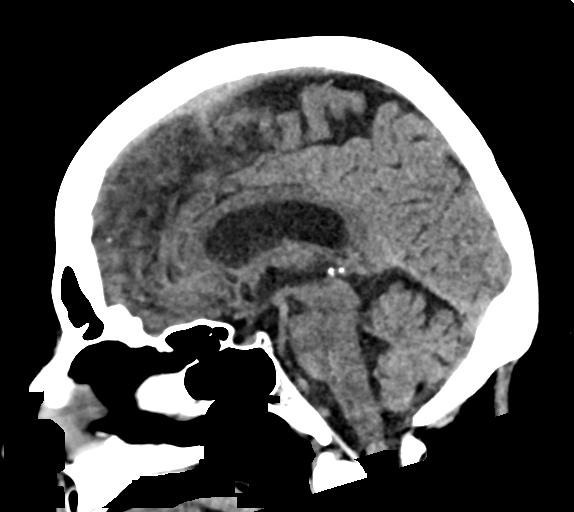
[im 39/59  brain]
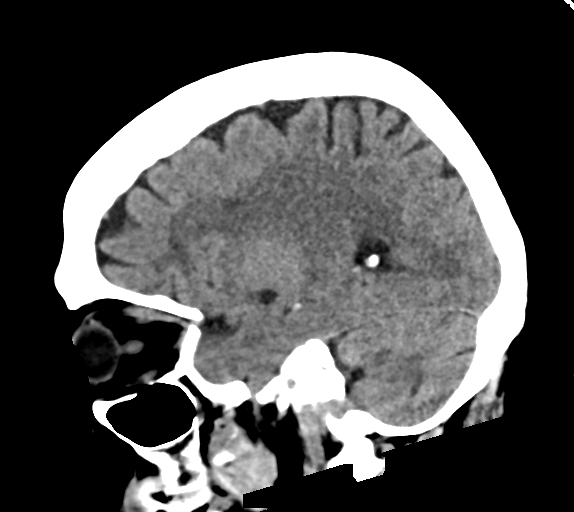

[17 of 47 positions shown; findings below may reference images not displayed]

FINDINGS: Brain: There is no mass, hemorrhage or extra-axial collection. The
size and configuration of the ventricles and extra-axial CSF spaces
are normal. There is hypoattenuation of the white matter, most
commonly indicating chronic small vessel disease.

Vascular: No abnormal hyperdensity of the major intracranial
arteries or dural venous sinuses. No intracranial atherosclerosis.

Skull: The visualized skull base, calvarium and extracranial soft
tissues are normal.

Sinuses/Orbits: No fluid levels or advanced mucosal thickening of
the visualized paranasal sinuses. No mastoid or middle ear effusion.
The orbits are normal.
IMPRESSION: Chronic small vessel disease without acute intracranial abnormality.

## 2022-02-12 IMAGING — DX DG CHEST 1V PORT
1 series · 1 of 1 positions shown · non-contrast
Comparison: None.

CLINICAL DATA: Shortness of breath

EXAM:
PORTABLE CHEST 1 VIEW

[chest]
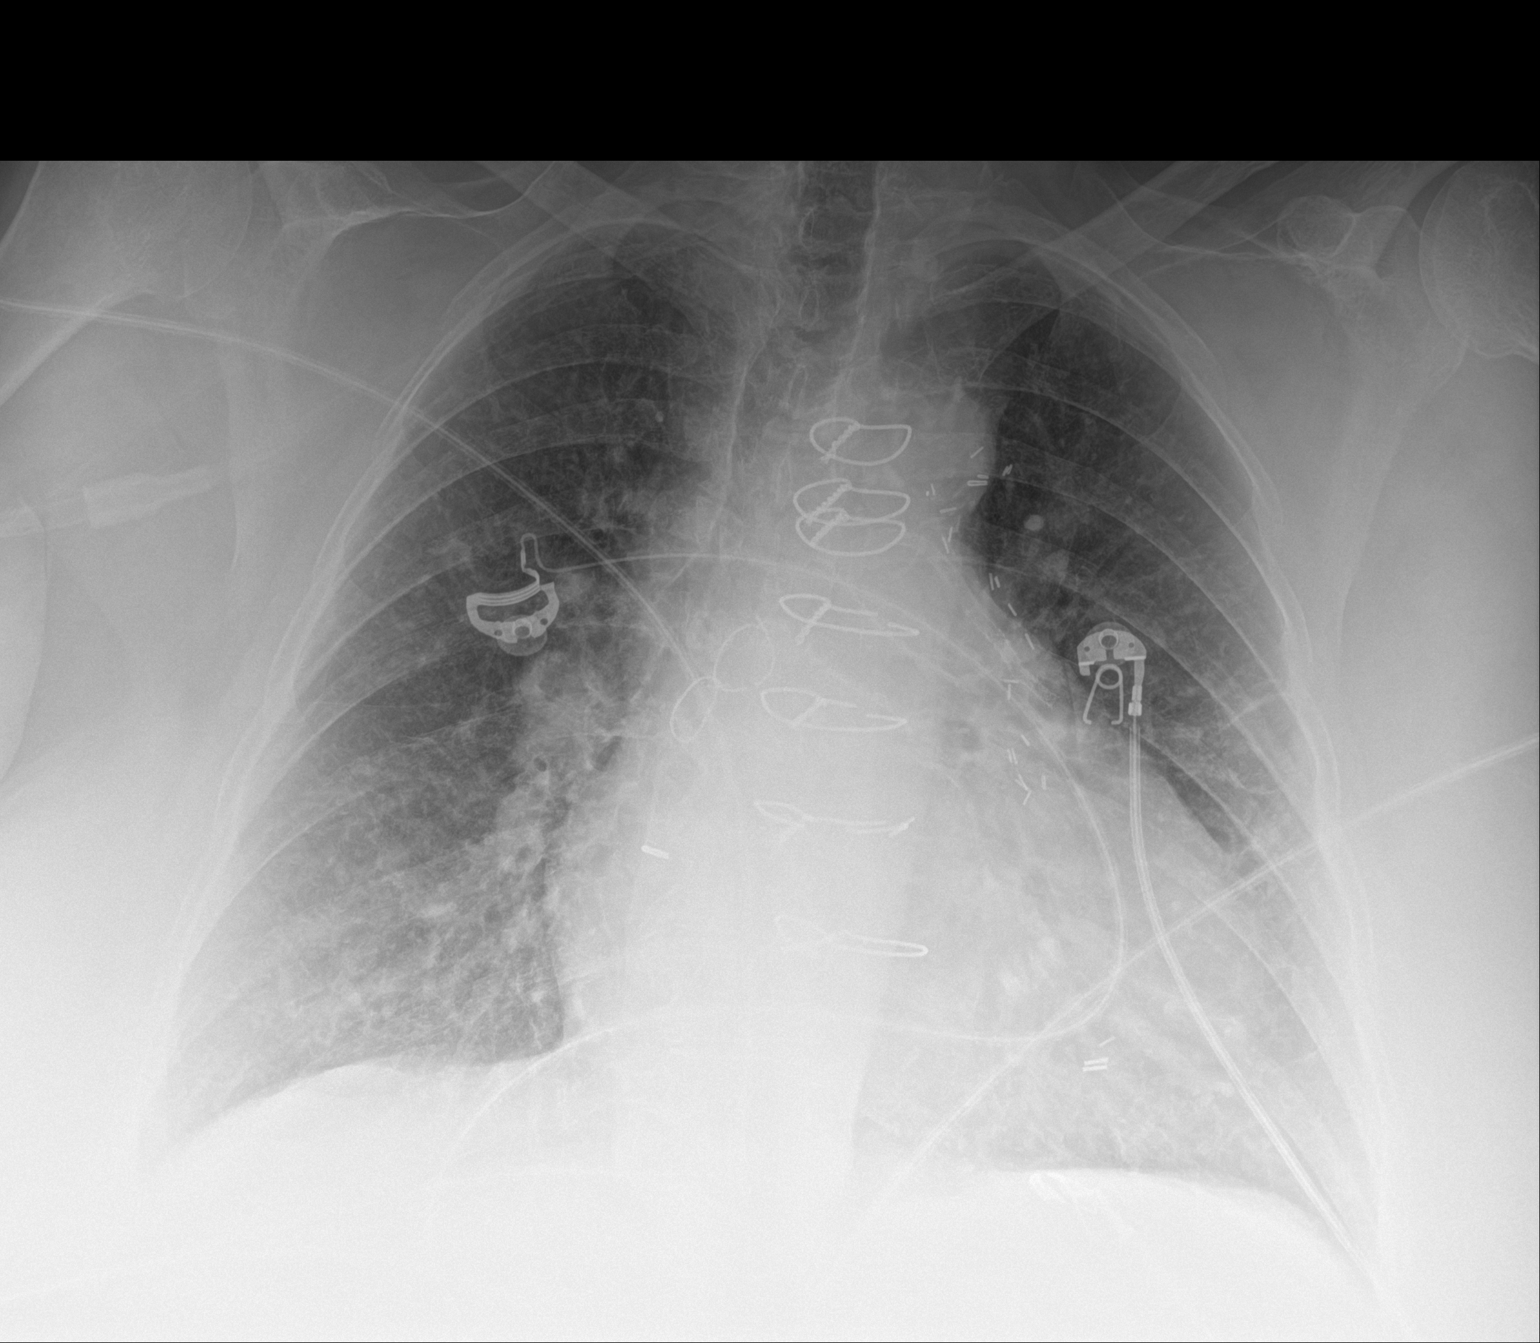

[1 of 1 positions shown; findings below may reference images not displayed]

FINDINGS: Mild cardiomegaly. Remote median sternotomy. Unchanged mild
interstitial opacity. The visualized skeletal structures are
unremarkable.
IMPRESSION: Cardiomegaly and mild interstitial pulmonary edema.

## 2022-02-16 NOTE — Telephone Encounter (Signed)
*  STAT* If patient is at the pharmacy, call can be transferred to refill team.   1. Which medications need to be refilled? (please list name of each medication and dose if known)  ranolazine (RANEXA) 1000 MG SR tablet (Expired)  2. Which pharmacy/location (including street and city if local pharmacy) is medication to be sent to?  [id number] [place] Phone:  [phone]        3. Do they need a 30 day or 90 day supply?   90 day  Patient only has 2 pills left.

## 2022-02-18 ENCOUNTER — Telehealth: Payer: Self-pay | Admitting: Internal Medicine

## 2022-02-18 ENCOUNTER — Other Ambulatory Visit: Payer: Self-pay | Admitting: Internal Medicine

## 2022-02-18 DIAGNOSIS — I2510 Atherosclerotic heart disease of native coronary artery without angina pectoris: Secondary | ICD-10-CM

## 2022-02-18 MED ORDER — RANOLAZINE ER 1000 MG PO TB12: 1000.0000 mg | ORAL_TABLET | Freq: Two times a day (BID) | ORAL | 0 refills | Status: DC

## 2022-02-18 NOTE — Telephone Encounter (Signed)
Pt is requesting a refill on her ranolazine (RANEXA) 1000 MG SR tablet RX. Please send to [id number] [name], [place]   Phone: [phone]

## 2022-02-18 NOTE — Telephone Encounter (Signed)
Spoke to patient . She states she takes phenergan before both doses of Ranexa each day. She states she can tolerate medication now.   RN gave patient [name] instructions about taking 1/2 tablet of Ranexa twice a day  if unable to tolerate due to being nausea can try taking once a day .   Patient states she does not want to stop taking Ranexa  because she develops  chest pain when she did. Patient voiced understanding.

## 2022-02-19 ENCOUNTER — Other Ambulatory Visit: Payer: Self-pay | Admitting: Physical Medicine & Rehabilitation

## 2022-02-21 ENCOUNTER — Other Ambulatory Visit: Payer: Self-pay | Admitting: Internal Medicine

## 2022-02-21 DIAGNOSIS — M47816 Spondylosis without myelopathy or radiculopathy, lumbar region: Secondary | ICD-10-CM

## 2022-03-02 ENCOUNTER — Other Ambulatory Visit: Payer: Self-pay | Admitting: Internal Medicine

## 2022-03-02 ENCOUNTER — Other Ambulatory Visit: Payer: Self-pay | Admitting: Emergency Medicine

## 2022-03-02 ENCOUNTER — Telehealth: Payer: Self-pay | Admitting: Internal Medicine

## 2022-03-02 DIAGNOSIS — M4726 Other spondylosis with radiculopathy, lumbar region: Secondary | ICD-10-CM

## 2022-03-02 DIAGNOSIS — Z515 Encounter for palliative care: Secondary | ICD-10-CM

## 2022-03-02 DIAGNOSIS — M1711 Unilateral primary osteoarthritis, right knee: Secondary | ICD-10-CM

## 2022-03-02 DIAGNOSIS — M47816 Spondylosis without myelopathy or radiculopathy, lumbar region: Secondary | ICD-10-CM

## 2022-03-02 MED ORDER — OXYCODONE HCL 10 MG PO TABS: 10.0000 mg | ORAL_TABLET | Freq: Three times a day (TID) | ORAL | 0 refills | Status: DC | PRN

## 2022-03-02 NOTE — Telephone Encounter (Signed)
Caller & Relationship to patient: Katherine [name]  Call back number: [phone]  Date of last office visit: 02/02/22  Date of next office visit:   Medication(s) to be refilled: Oxycodone HCl 10 MG TABS   Preferred Pharmacy:  [id number] [place], [place] Phone:  [phone] Fax:  [phone]

## 2022-03-05 ENCOUNTER — Telehealth: Payer: Self-pay | Admitting: Internal Medicine

## 2022-03-05 IMAGING — DX DG CHEST 1V PORT
2 series · 2 of 2 positions shown · non-contrast
Comparison: January 29, 2021.

CLINICAL DATA: Chest pain.

EXAM:
PORTABLE CHEST 1 VIEW

[chest ap (1 of 2)]
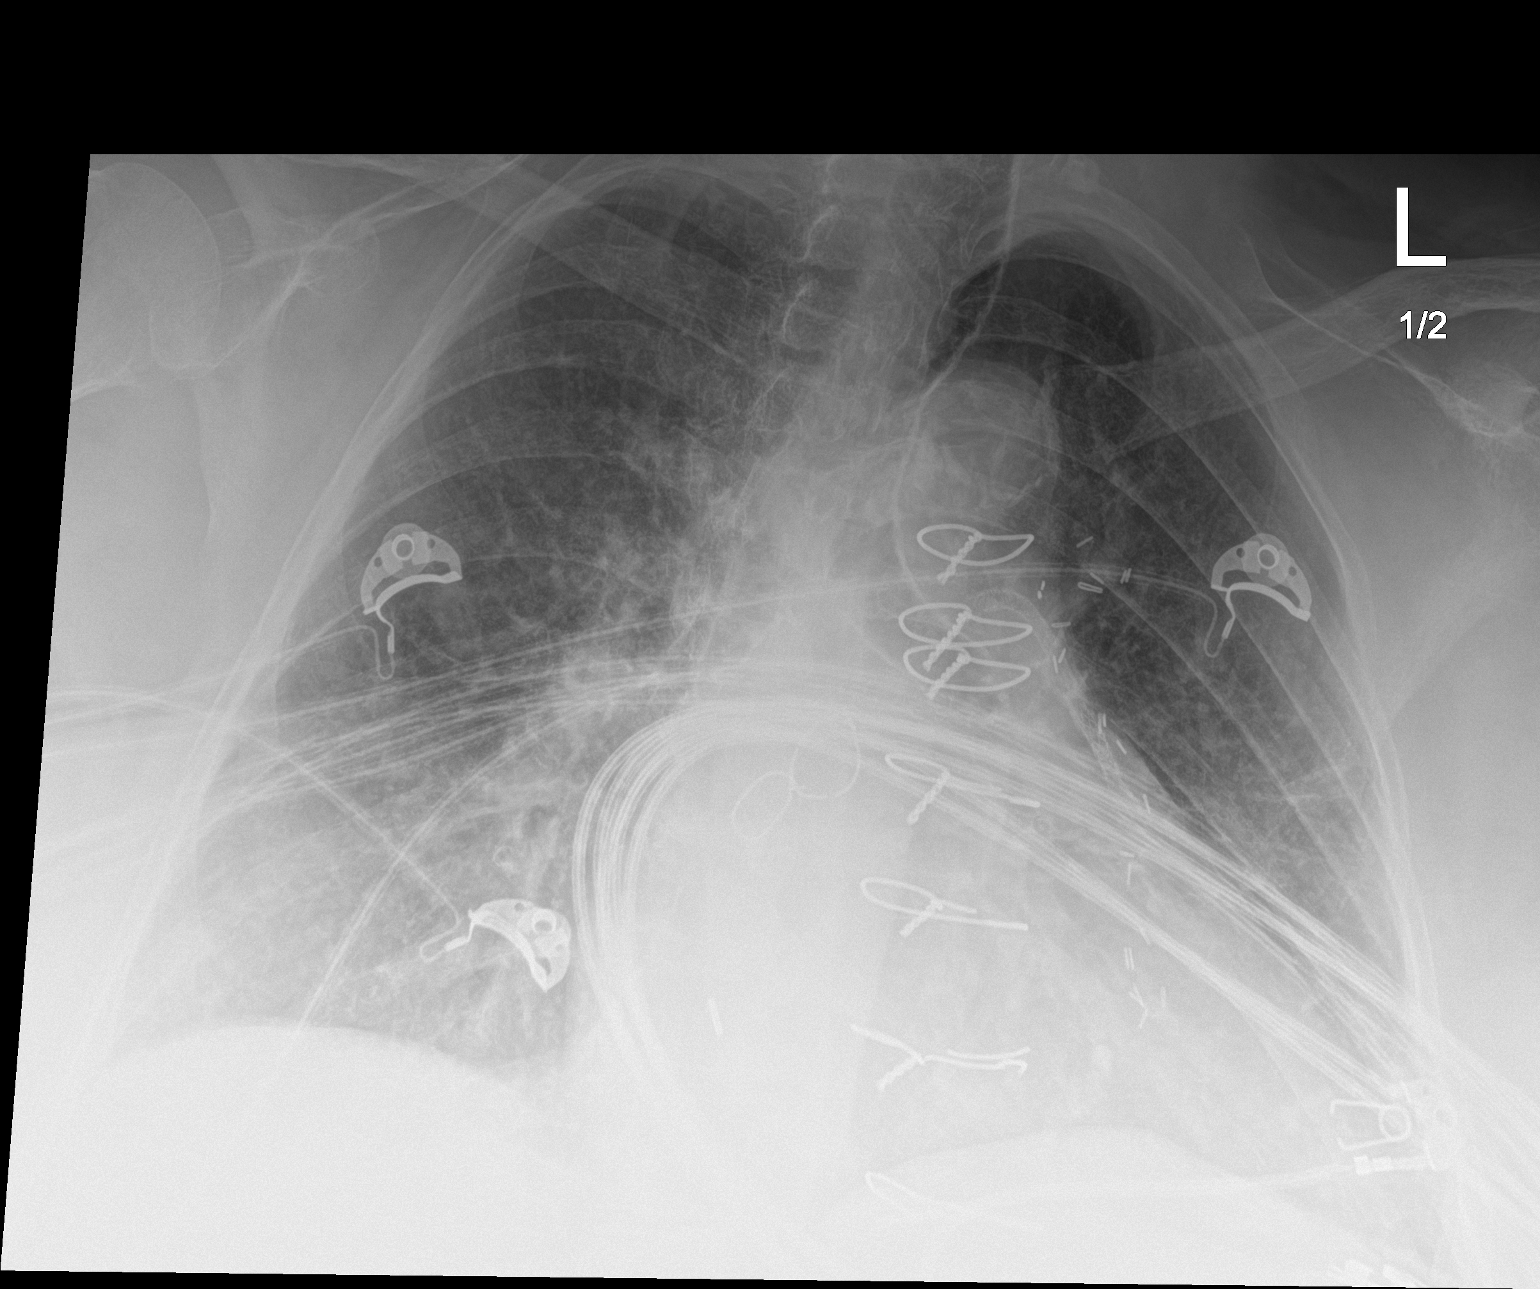

[chest ap (2 of 2)]
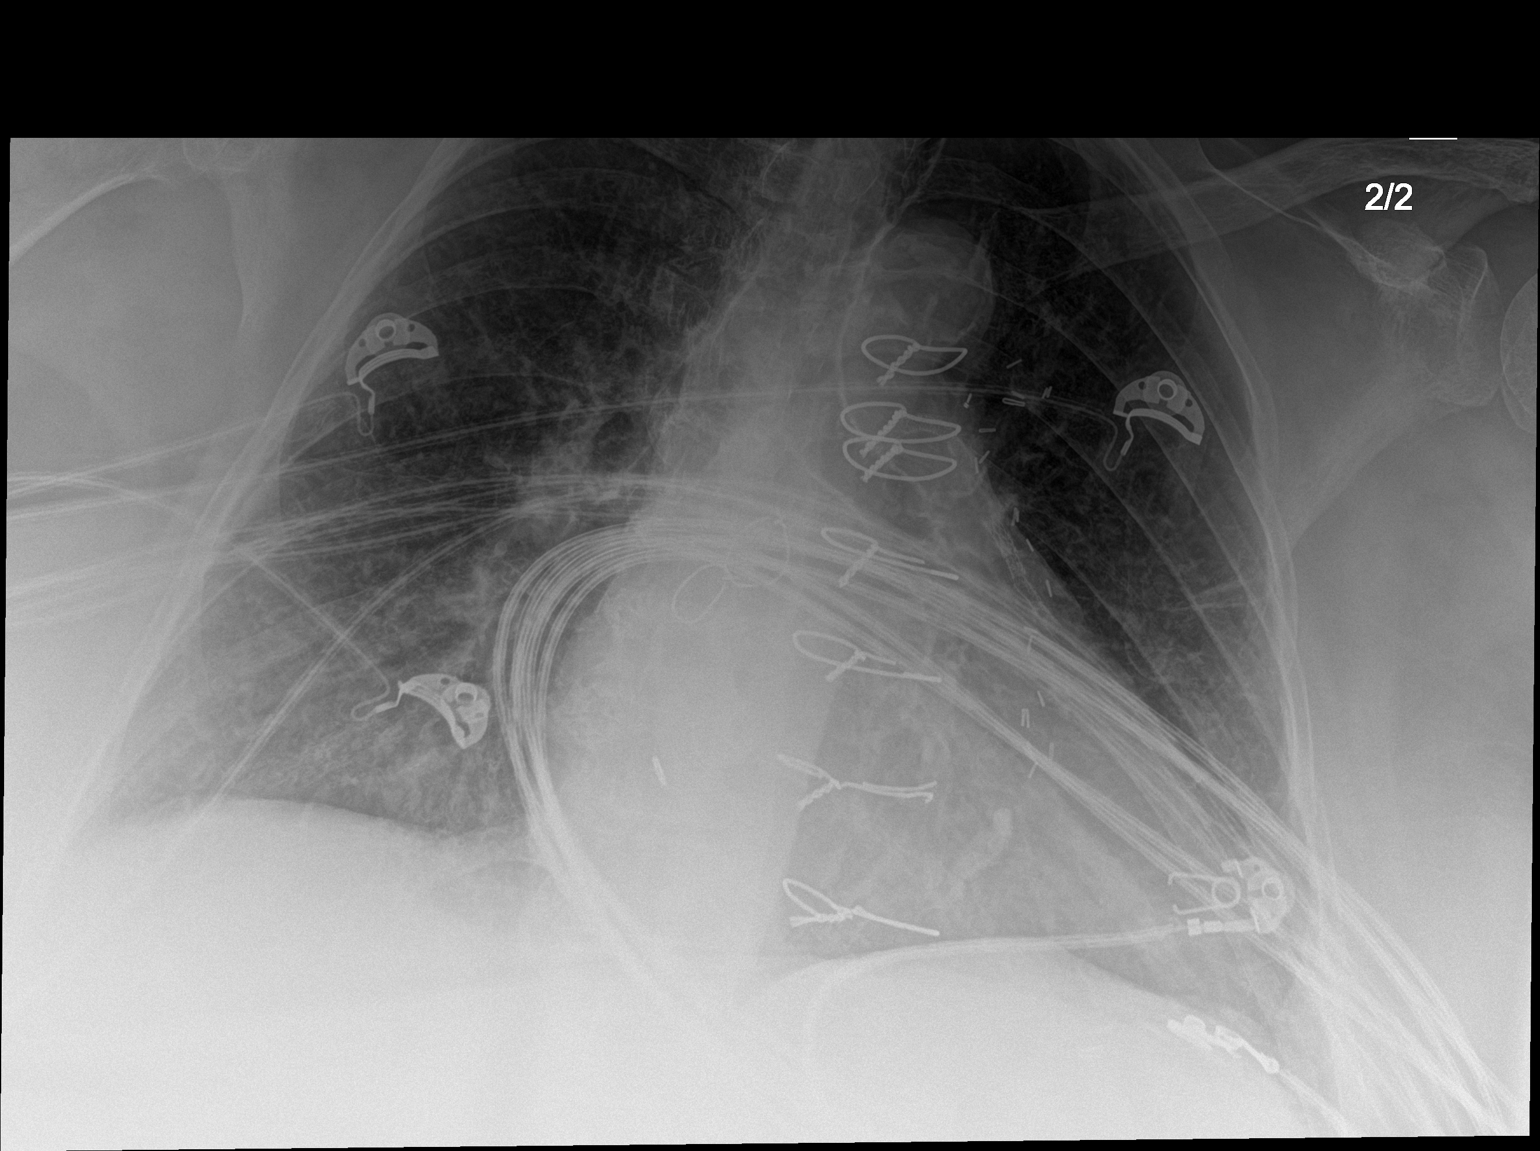

[2 of 2 positions shown; findings below may reference images not displayed]

FINDINGS: Similar enlarged cardiac silhouette. Median sternotomy and CABG.
Calcific atherosclerosis of the aorta. Similar central pulmonary
vascular congestion. Similar mild interstitial prominence. No
consolidation. No visible pleural effusions or pneumothorax on these
limited AP semi erect radiographs.
IMPRESSION: 1. Similar cardiomegaly and central pulmonary vascular congestion.
2. Similar mild interstitial prominence, which could reflect mild
interstitial edema versus the sequela of recurrent bouts of
congestive heart failure.

## 2022-03-05 NOTE — Telephone Encounter (Signed)
No note needed 

## 2022-03-14 ENCOUNTER — Other Ambulatory Visit: Payer: Self-pay | Admitting: Cardiology

## 2022-03-16 ENCOUNTER — Other Ambulatory Visit: Payer: Self-pay | Admitting: Emergency Medicine

## 2022-04-03 ENCOUNTER — Telehealth: Payer: Self-pay | Admitting: Internal Medicine

## 2022-04-03 DIAGNOSIS — M1711 Unilateral primary osteoarthritis, right knee: Secondary | ICD-10-CM

## 2022-04-03 NOTE — Telephone Encounter (Signed)
Patient would like to get a set of crutches that go on to her elbow - she does not want the ones that go under her arm - please advise.

## 2022-04-04 IMAGING — DX DG CHEST 1V PORT
1 series · 1 of 1 positions shown · non-contrast
Comparison: 02/19/2021

CLINICAL DATA: Chest pain and shortness of breath

EXAM:
PORTABLE CHEST 1 VIEW

[chest]
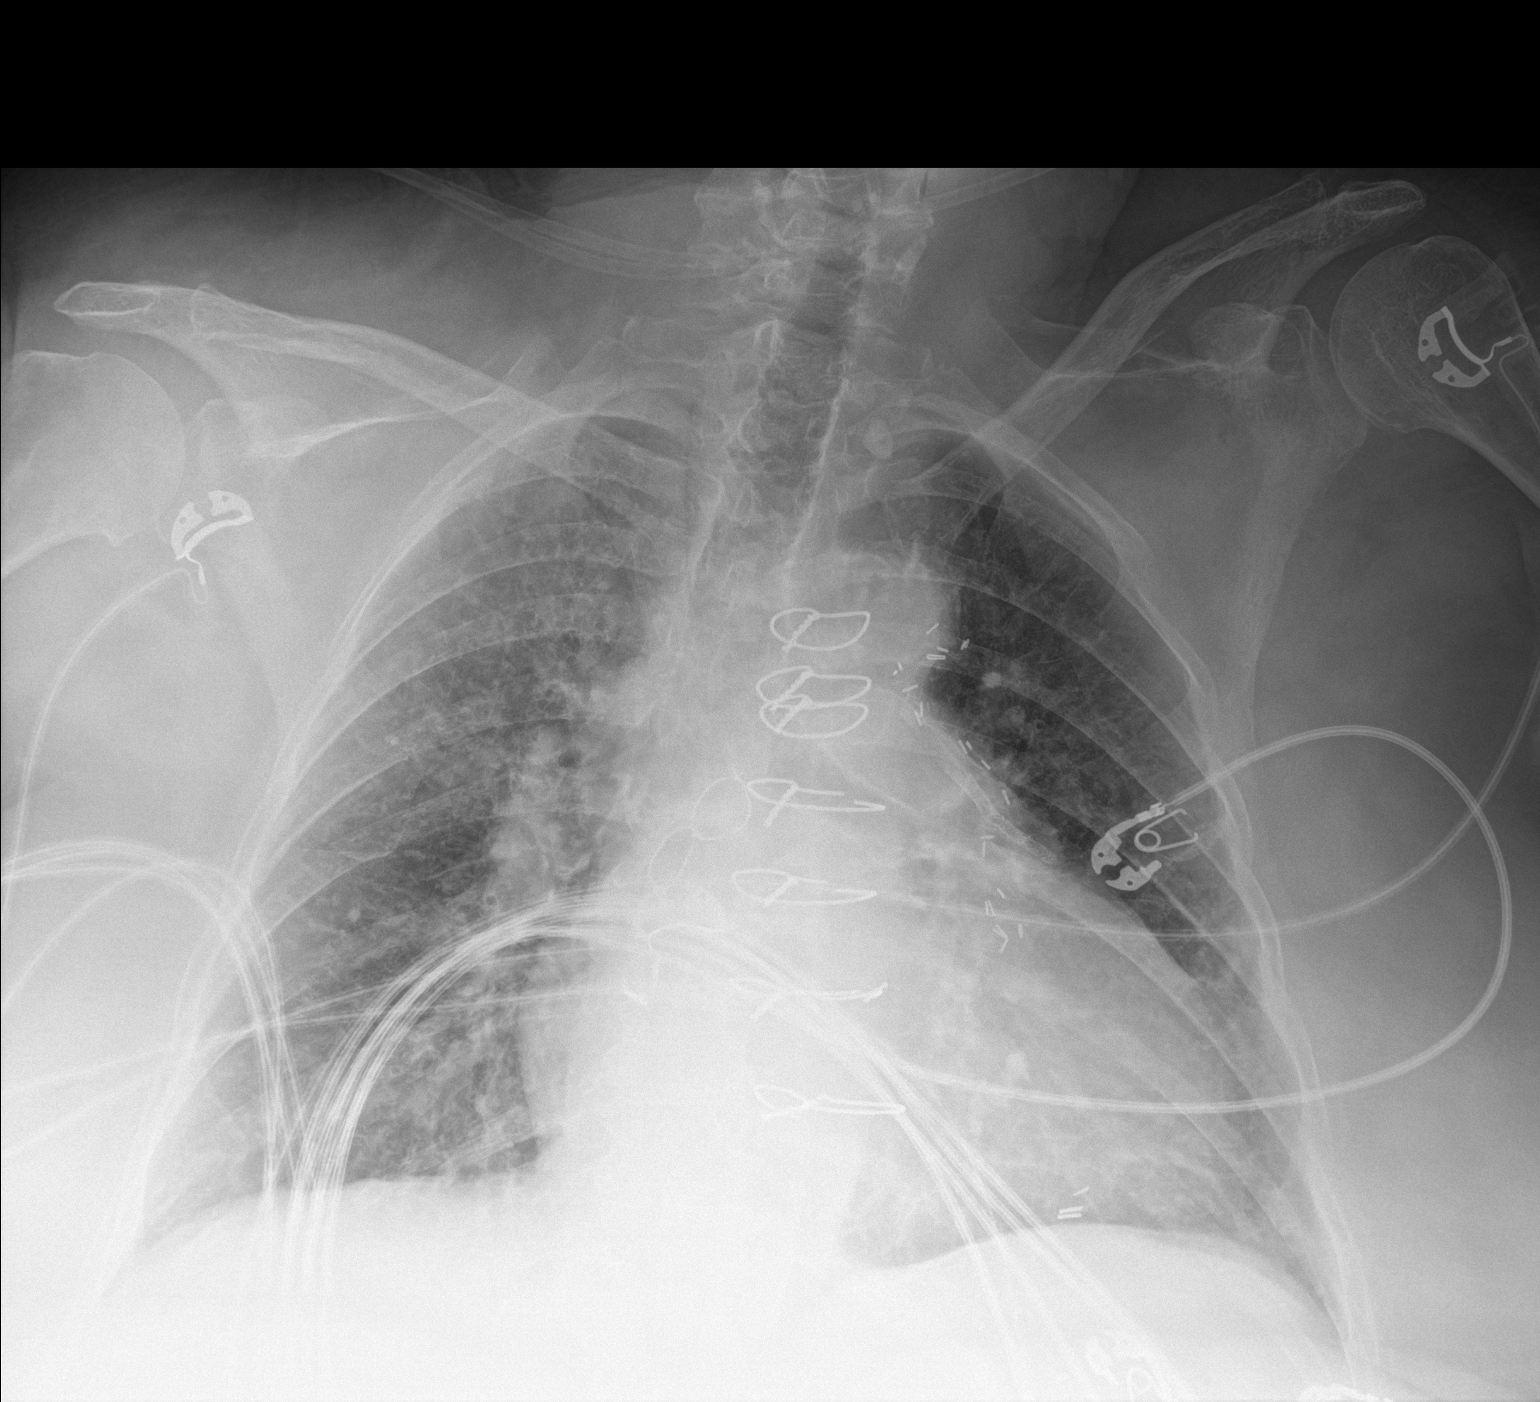

[1 of 1 positions shown; findings below may reference images not displayed]

FINDINGS: Cardiomegaly. CABG and coronary/venous graft stenting. Interstitial
coarsening similar to priors. No Kerley lines or definite effusion.
No air bronchogram.
IMPRESSION: Chronic cardiomegaly and interstitial coarsening. No acute finding
when compared to priors.

## 2022-04-04 IMAGING — CR DG LUMBAR SPINE 2-3V
3 series · 3 of 3 positions shown · non-contrast
Comparison: None.

CLINICAL DATA: Low back pain

EXAM:
LUMBAR SPINE - 3 VIEW

[l-spine ap]
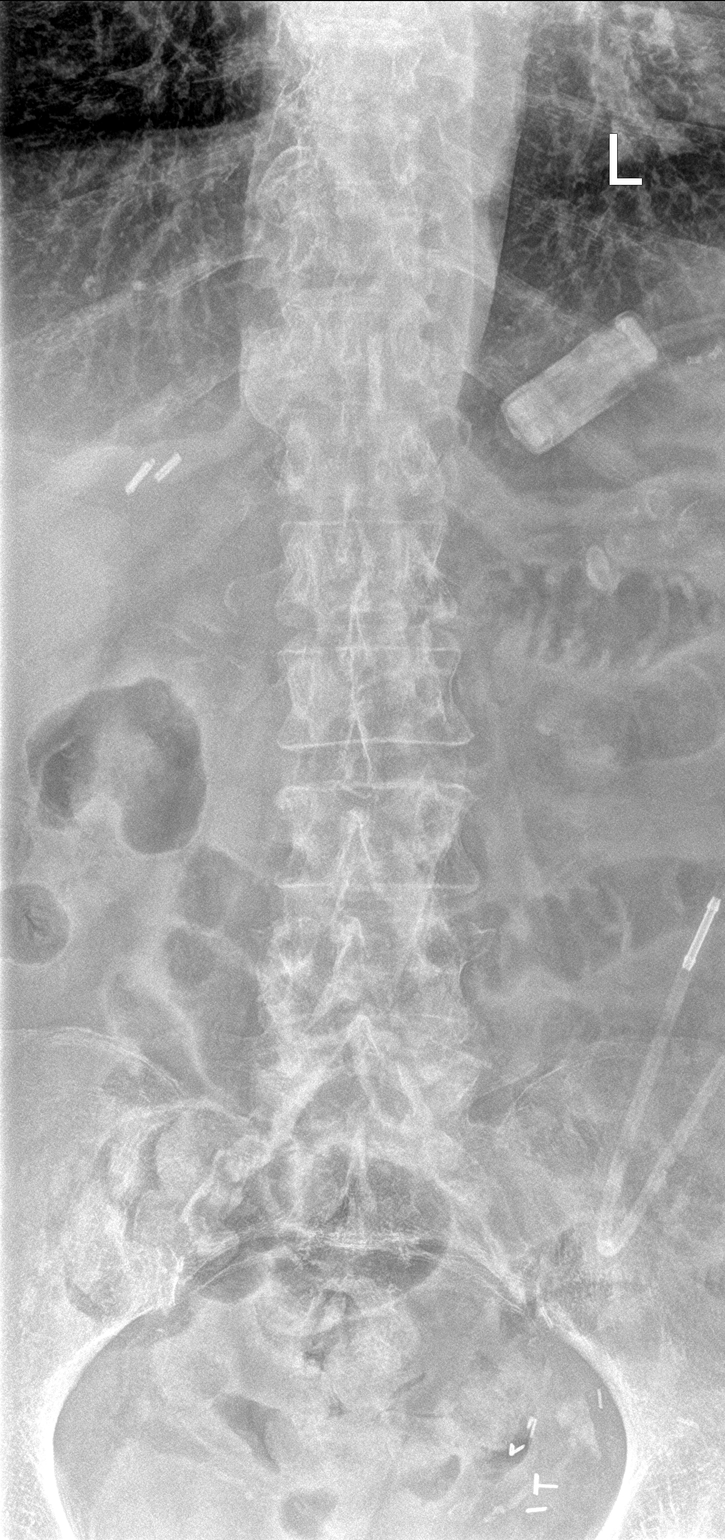

[l-spine lat]
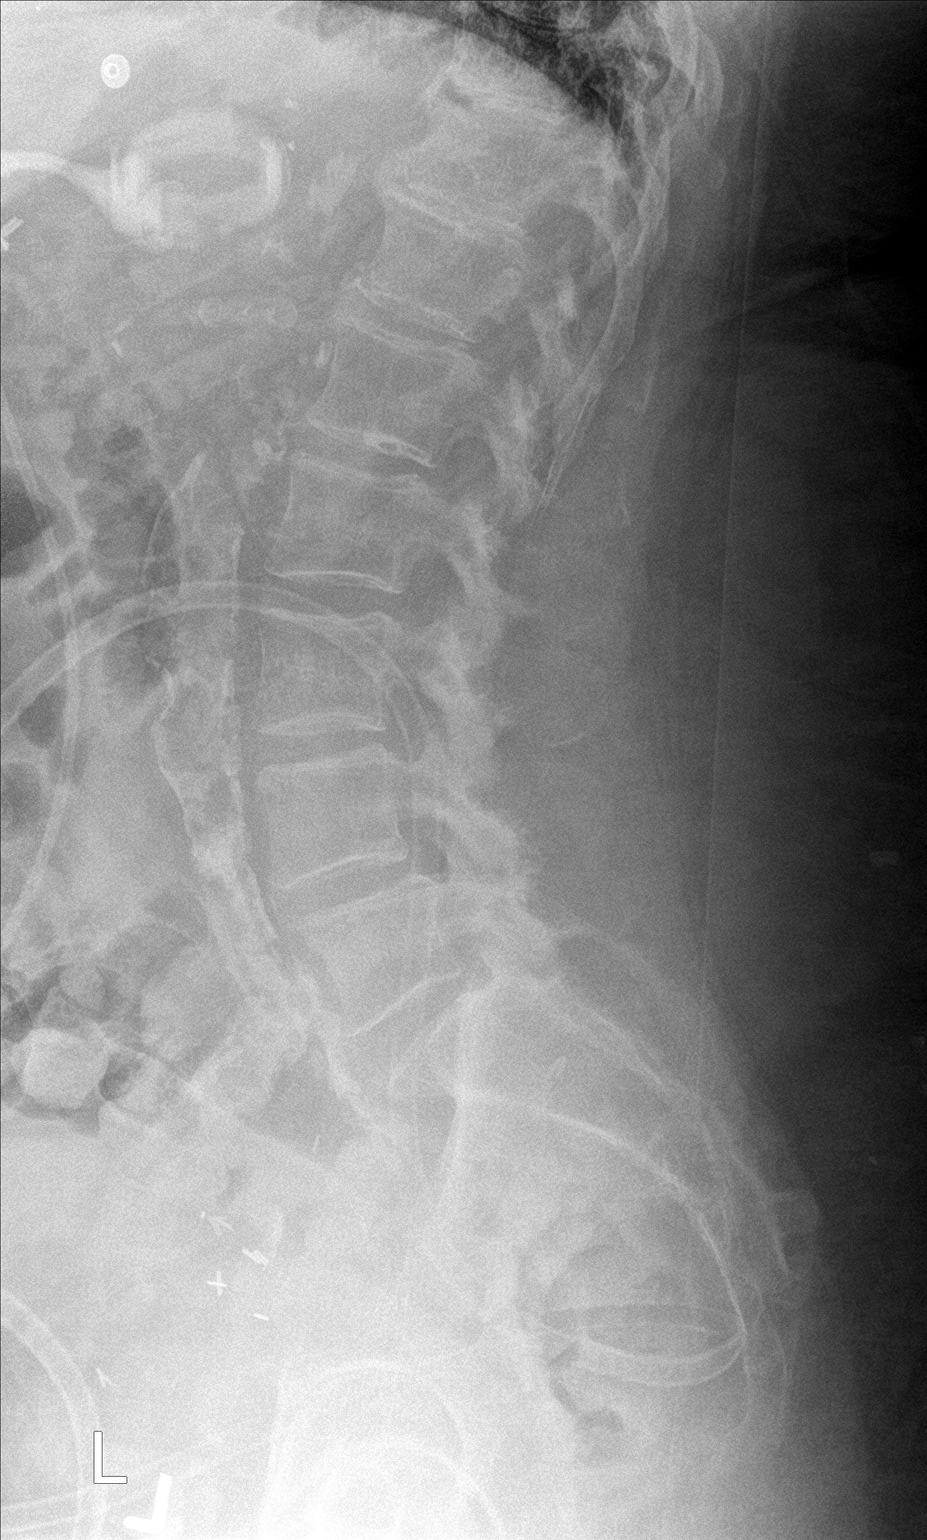

[l-spine spot]
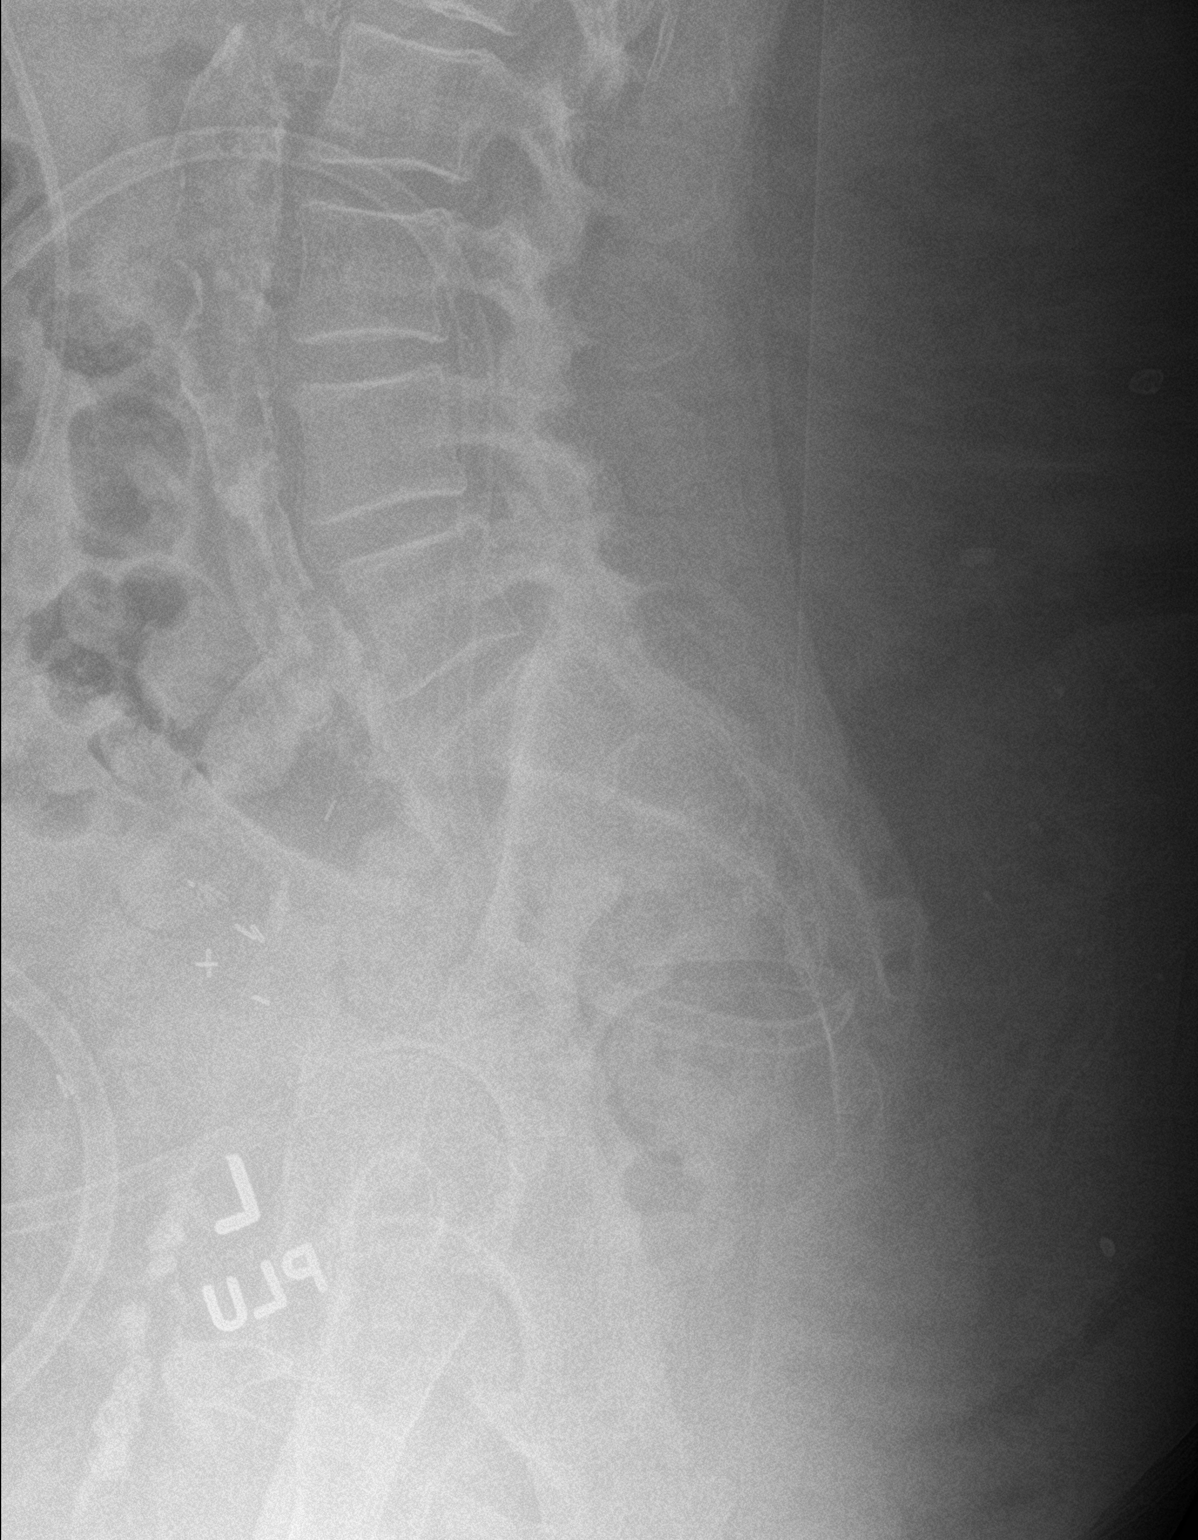

[3 of 3 positions shown; findings below may reference images not displayed]

FINDINGS: Five lumbar type vertebral bodies are well visualized. Vertebral
body height is well maintained. Facet hypertrophic changes are
noted. Mild anterolisthesis of L4 on L5 is seen. Gastric lap band is
noted and appears within normal limits. Diffuse vascular
calcifications are noted. No acute bony abnormality is seen.
IMPRESSION: Degenerative changes as described.  No acute abnormality noted.

## 2022-04-06 NOTE — Telephone Encounter (Signed)
"  [employer]" sent to [employer] in regard.

## 2022-04-06 NOTE — Telephone Encounter (Signed)
DME ordered

## 2022-04-09 ENCOUNTER — Other Ambulatory Visit (HOSPITAL_COMMUNITY): Payer: Self-pay

## 2022-04-10 ENCOUNTER — Other Ambulatory Visit: Payer: Self-pay | Admitting: Emergency Medicine

## 2022-04-13 ENCOUNTER — Telehealth: Payer: Self-pay | Admitting: Internal Medicine

## 2022-04-13 IMAGING — DX DG FOREARM 2V*L*
2 series · 2 of 2 positions shown · non-contrast
Comparison: None.

CLINICAL DATA: Pain.  Patient reports multiple falls.

EXAM:
LEFT FOREARM - 2 VIEW

[forearm ap]
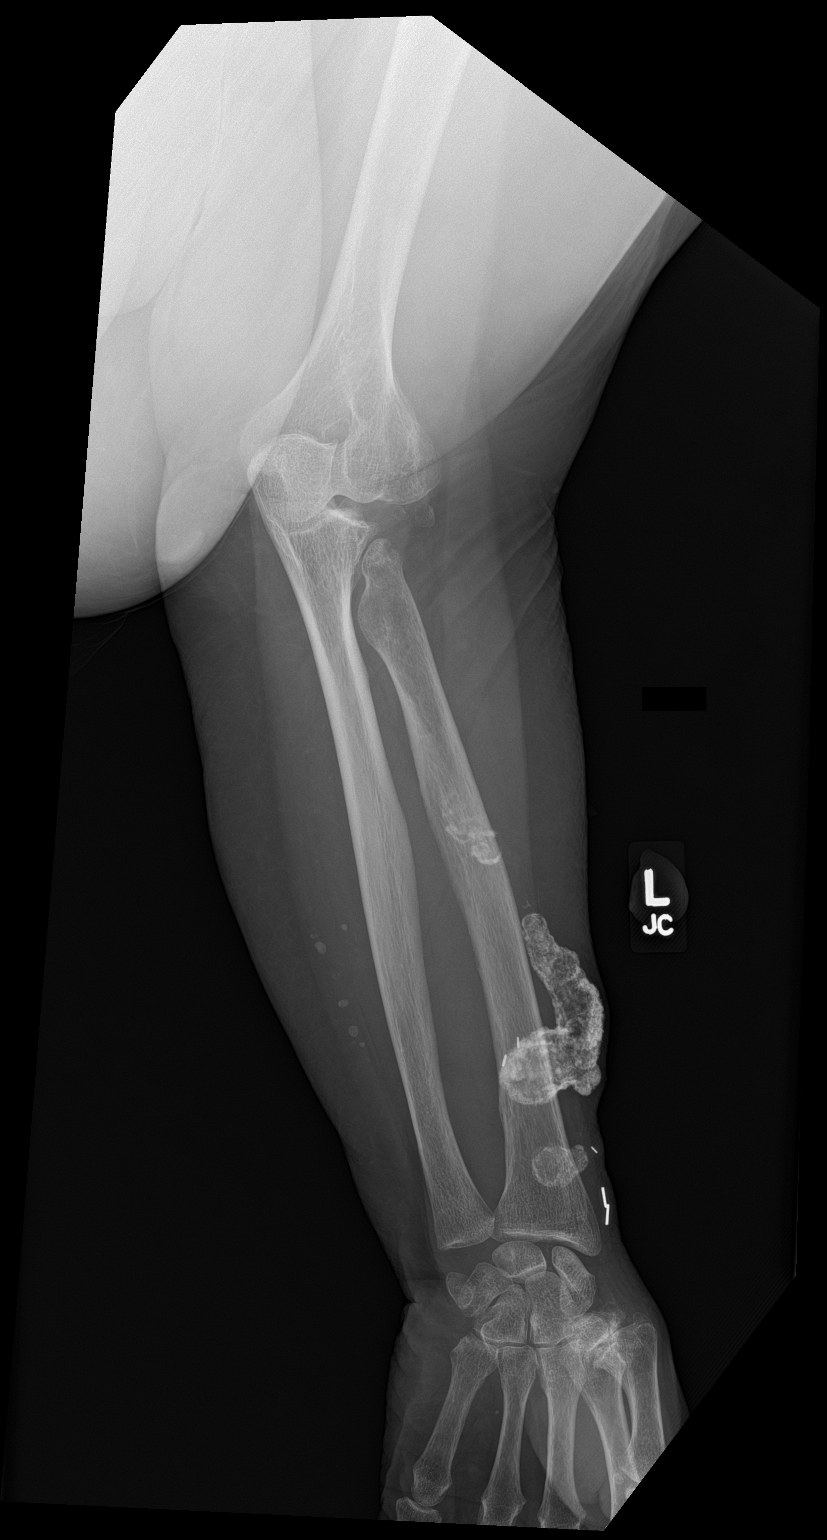

[forearm lat]
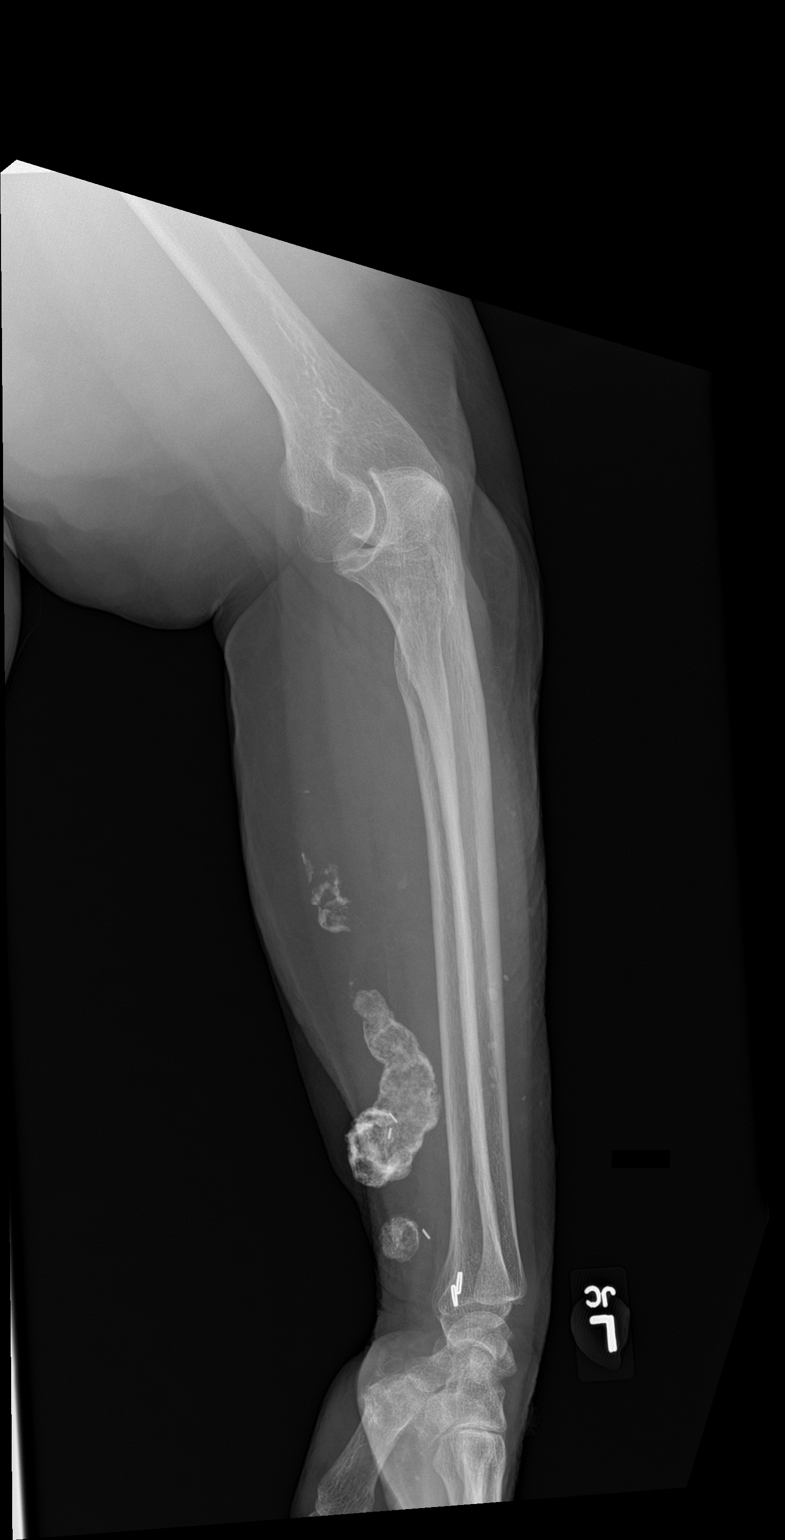

[2 of 2 positions shown; findings below may reference images not displayed]

FINDINGS: No acute fracture. Chronic deformity of the proximal radius is
likely postsurgical. Normal ulnar trochlear alignment. Normal wrist
alignment. Surgical clips in the soft tissues with tubular soft
tissue calcifications, possibly related to vascular access for
dialysis. Osteoarthritis of the thumb carpal metacarpal joint.
IMPRESSION: 1. No acute fracture of the forearm.
2. Chronic deformity of the proximal radius is likely in part
postsurgical.
3. Tubular soft tissue calcifications with multiple surgical clips,
likely related to prior vascular access for dialysis.

## 2022-04-13 IMAGING — DX DG CHEST 1V PORT
1 series · 1 of 1 positions shown · non-contrast
Comparison: Chest x-rays dated 03/21/2021, 02/19/2021 and
07/11/2020.

CLINICAL DATA: Cough.

EXAM:
PORTABLE CHEST 1 VIEW

[chest ap]
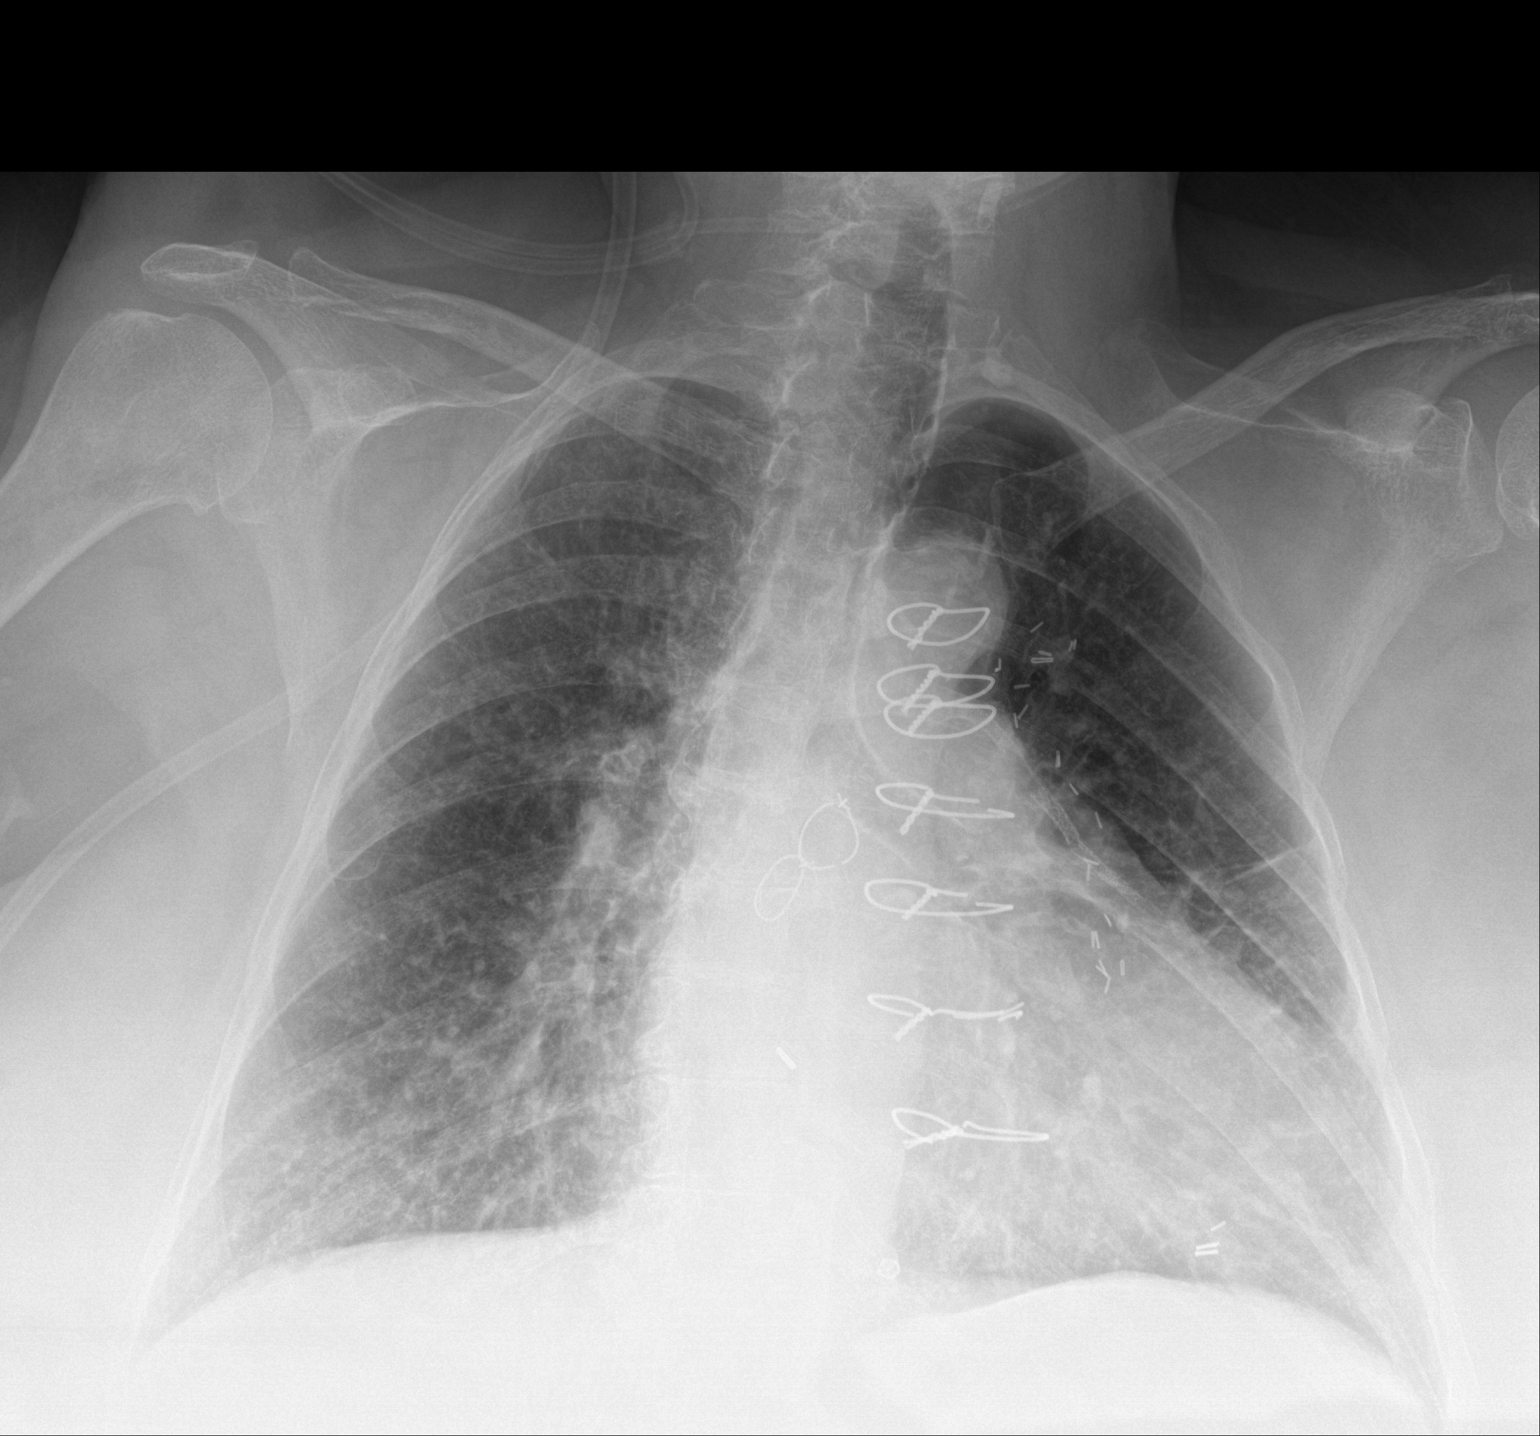

[1 of 1 positions shown; findings below may reference images not displayed]

FINDINGS: Stable cardiomegaly. Surgical changes of CABG with median sternotomy
wires in place. Coarse interstitial markings are again seen
bilaterally, stable compared to multiple prior studies. No confluent
opacity to suggest a superimposed pneumonia. No pleural effusion or
pneumothorax.
IMPRESSION: 1. No acute findings. Stable cardiomegaly. Stable diffuse
interstitial prominence indicating chronic mild CHF and/or chronic
interstitial lung disease.
2. No evidence of pneumonia or pulmonary edema.

## 2022-04-13 IMAGING — CT CT CERVICAL SPINE W/O CM
5 of 6 series · 14 of 33 positions shown, 16 images · non-contrast
Comparison: None.

CLINICAL DATA: Multiple falls, neck pain.

EXAM:
CT HEAD WITHOUT CONTRAST
CT CERVICAL SPINE WITHOUT CONTRAST
TECHNIQUE: Multidetector CT imaging of the head and cervical spine was
performed following the standard protocol without intravenous
contrast. Multiplanar CT image reconstructions of the cervical spine
were also generated.

[Series 7: c_spine 2.0 orthogonals · axial · 0.28mm/px · z∈[-217,-158]mm · 2 of 82 slices shown, 3 images]
[im 28/82  soft-tissue]
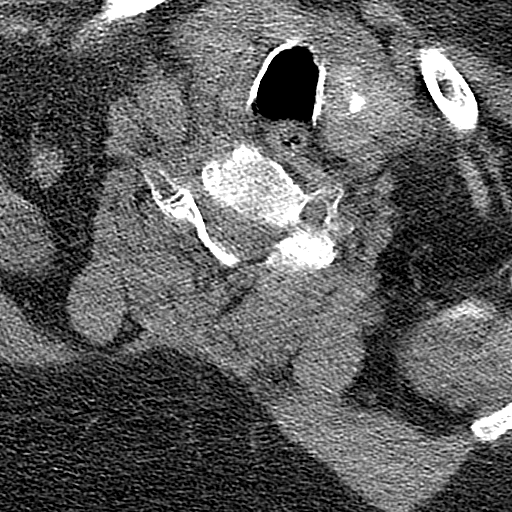
[im 28/82  bone]
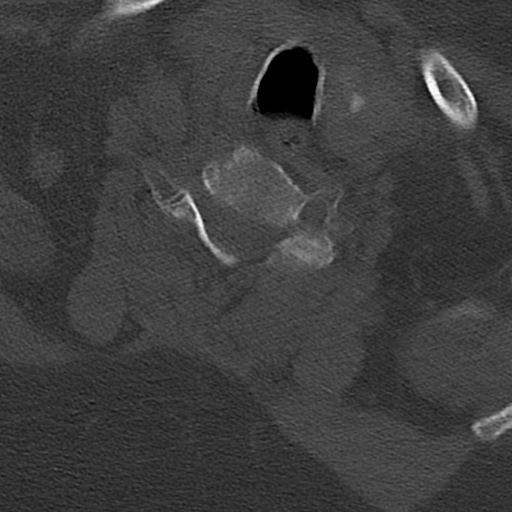
[im 55/82  bone]
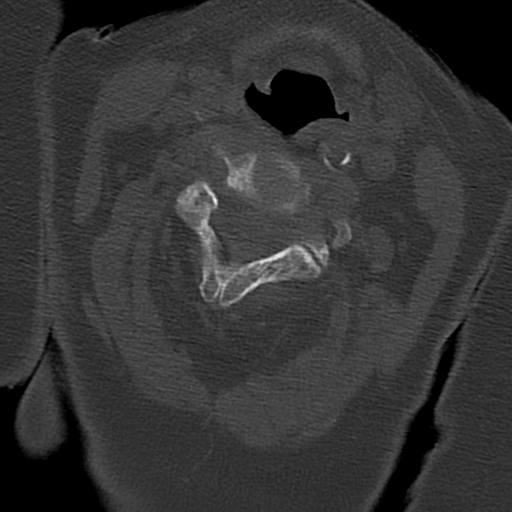

[Series 8: c_spine 2.0 st · axial · 0.32mm/px · z∈[-188,-132]mm · 2 of 85 slices shown]
[im 29/85  bone]
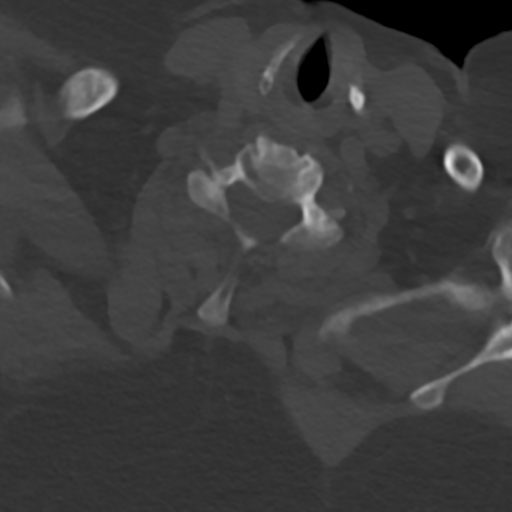
[im 57/85  bone]
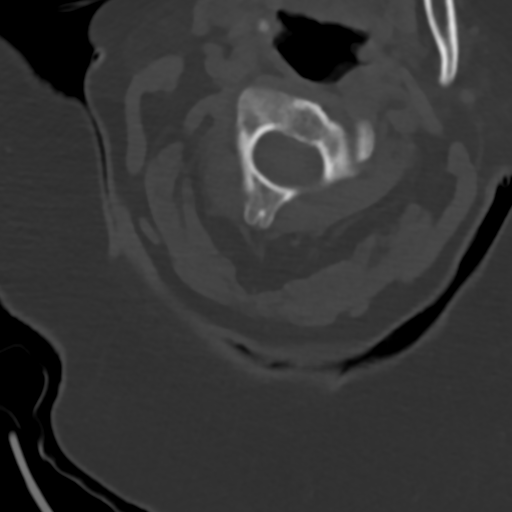

[Series 9: c_spine 2.0 ax bone · axial · 0.32mm/px · z∈[-219,-165]mm · 2 of 84 slices shown]
[im 28/84  bone]
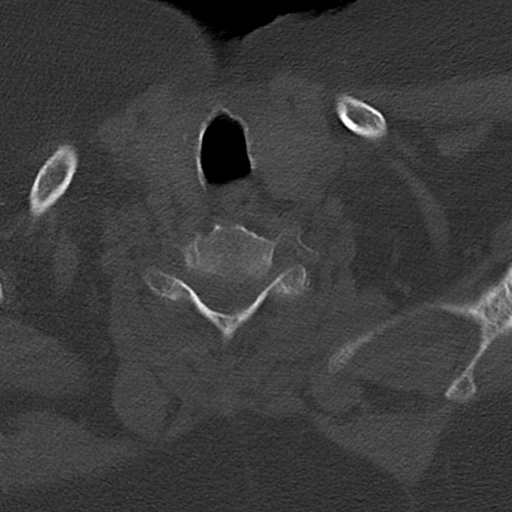
[im 56/84  bone]
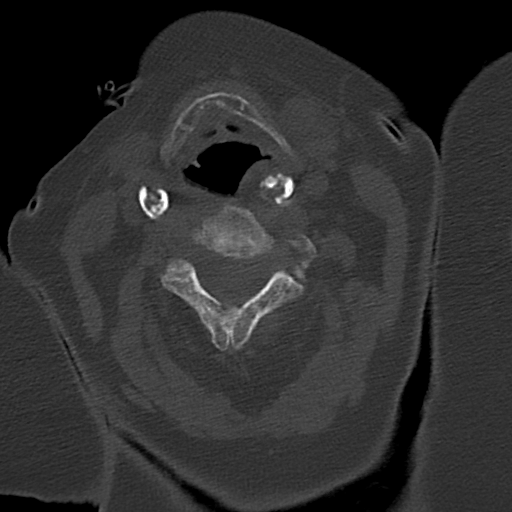

[Series 10: c_spine 2.0 sag bone · sagittal · 0.35mm/px · 5 of 61 slices shown, 6 images]
[im 21/61  bone]
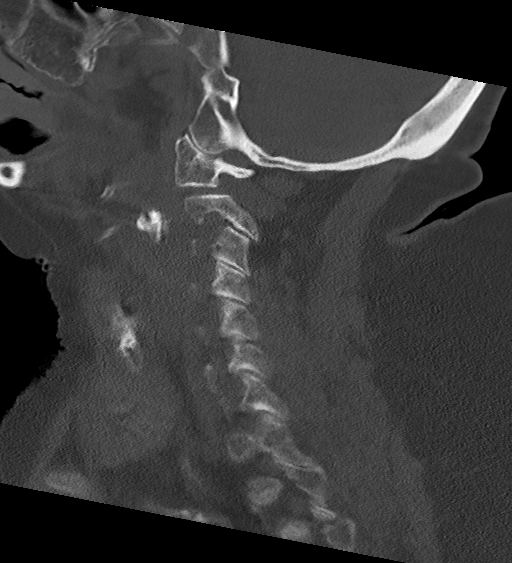
[im 26/61  bone]
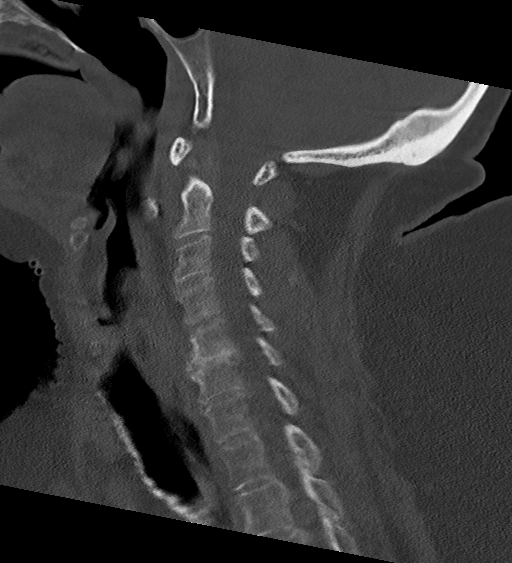
[im 31/61  soft-tissue]
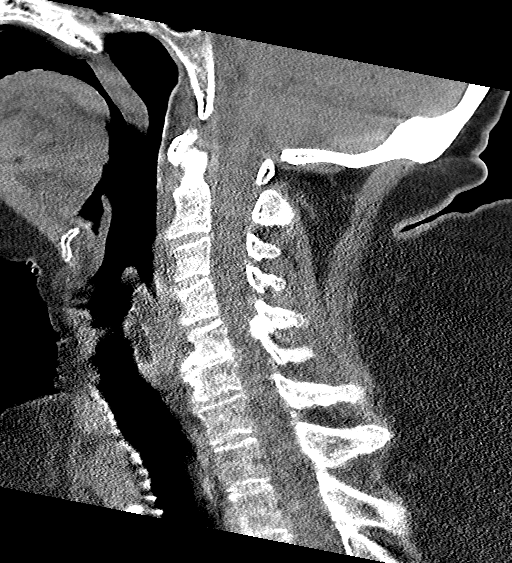
[im 31/61  bone]
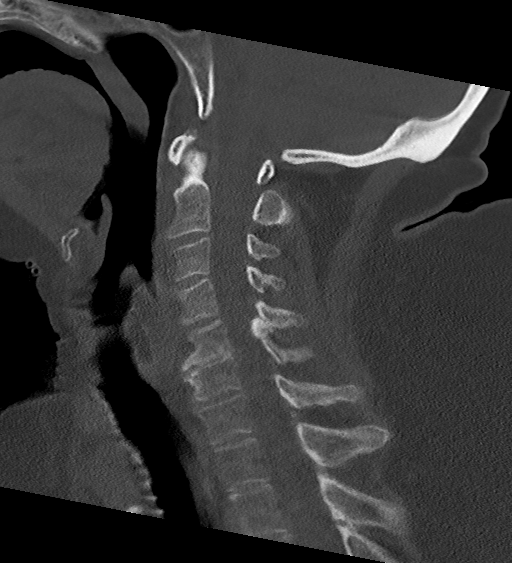
[im 36/61  bone]
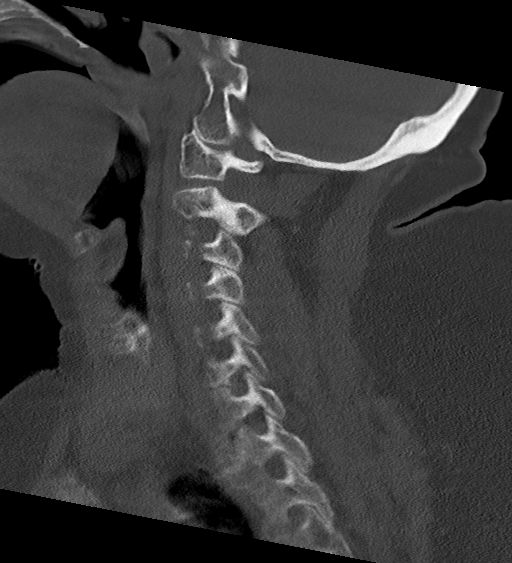
[im 41/61  bone]
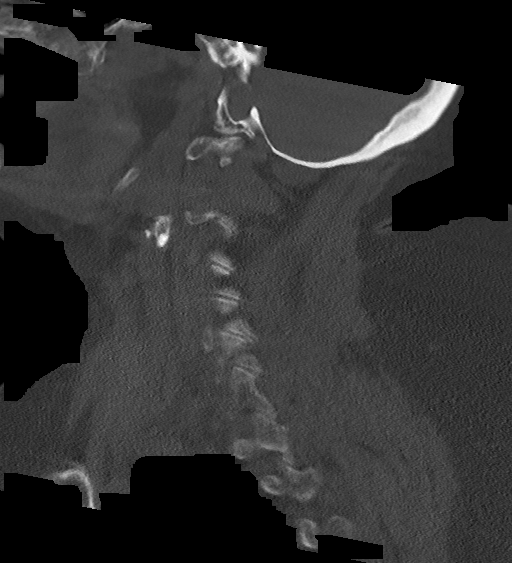

[Series 11: c_spine 2.0 cor bone · coronal · 0.31mm/px · 3 of 58 slices shown]
[im 12/58  bone]
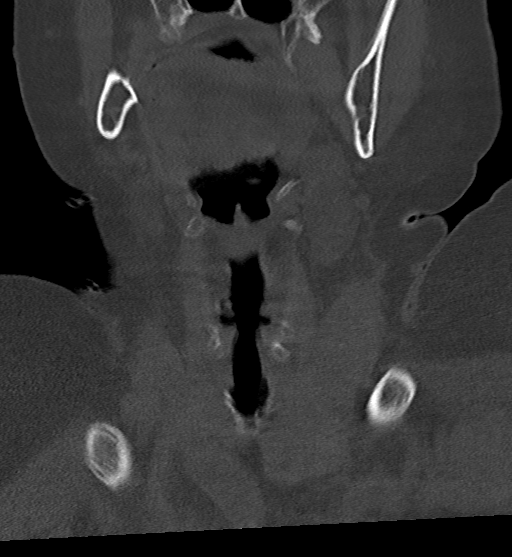
[im 23/58  bone]
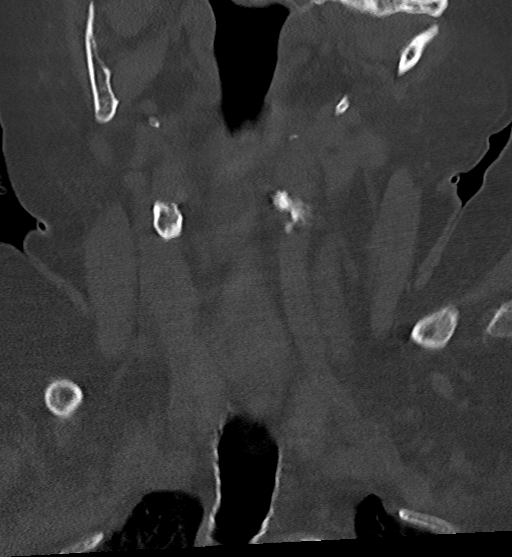
[im 35/58  bone]
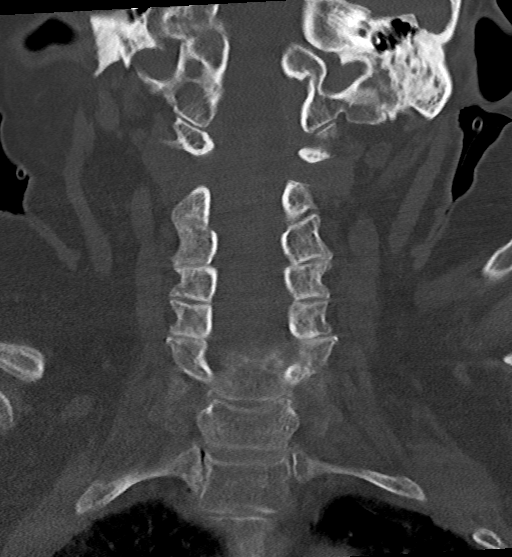

[14 of 33 positions shown; findings below may reference images not displayed]

FINDINGS: CT HEAD FINDINGS

Brain: No hydrocephalus. Mild chronic small vessel ischemic changes
within the bilateral periventricular and subcortical white matter
regions.

No mass, hemorrhage, edema or other evidence of acute parenchymal
abnormality. No extra-axial hemorrhage

Vascular: Chronic calcified atherosclerotic changes of the large
vessels at the skull base. No unexpected hyperdense vessel.

Skull: Normal. Negative for fracture or focal lesion.

Sinuses/Orbits: No acute finding.

Other: None.

CT CERVICAL SPINE FINDINGS

Alignment: No evidence of acute vertebral body subluxation.

Skull base and vertebrae: Characterization of osseous detail is
limited by patient motion artifact, but there is no fracture line or
displaced fracture fragment identified. Facet joints appear normally
aligned throughout.

Soft tissues and spinal canal: No prevertebral fluid or swelling. No
visible canal hematoma.

Disc levels: Mild degenerative spondylosis within the mid/lower
cervical spine. No more than mild central canal stenosis at any
level.

Upper chest: Ground-glass opacities at the lung apices, incompletely
imaged.

Other: None.
IMPRESSION: 1. No acute intracranial abnormality. No intracranial mass,
hemorrhage or edema. No skull fracture. Mild chronic small vessel
ischemic changes within the white matter.
2. No fracture or acute subluxation within the cervical spine, with
mild study limitations detailed above. Mild degenerative change
within the mid/lower cervical spine.
3. Ground-glass opacities at the lung apices, incompletely imaged.
Differential includes atypical pneumonias such as viral or fungal,
interstitial pneumonias, edema related to volume overload/CHF,
chronic interstitial diseases hypersensitivity pneumonitis, and
respiratory bronchiolitis.

## 2022-04-13 IMAGING — CR DG FOREARM 2V*R*
2 series · 3 of 3 positions shown · non-contrast
Comparison: None.

CLINICAL DATA: 64-year-old female with arm pain after a fall

EXAM:
RIGHT FOREARM - 2 VIEW

[Series 1: forearm ap · 0.14mm/px · 2 of 2 slices shown]
[im 1/2]
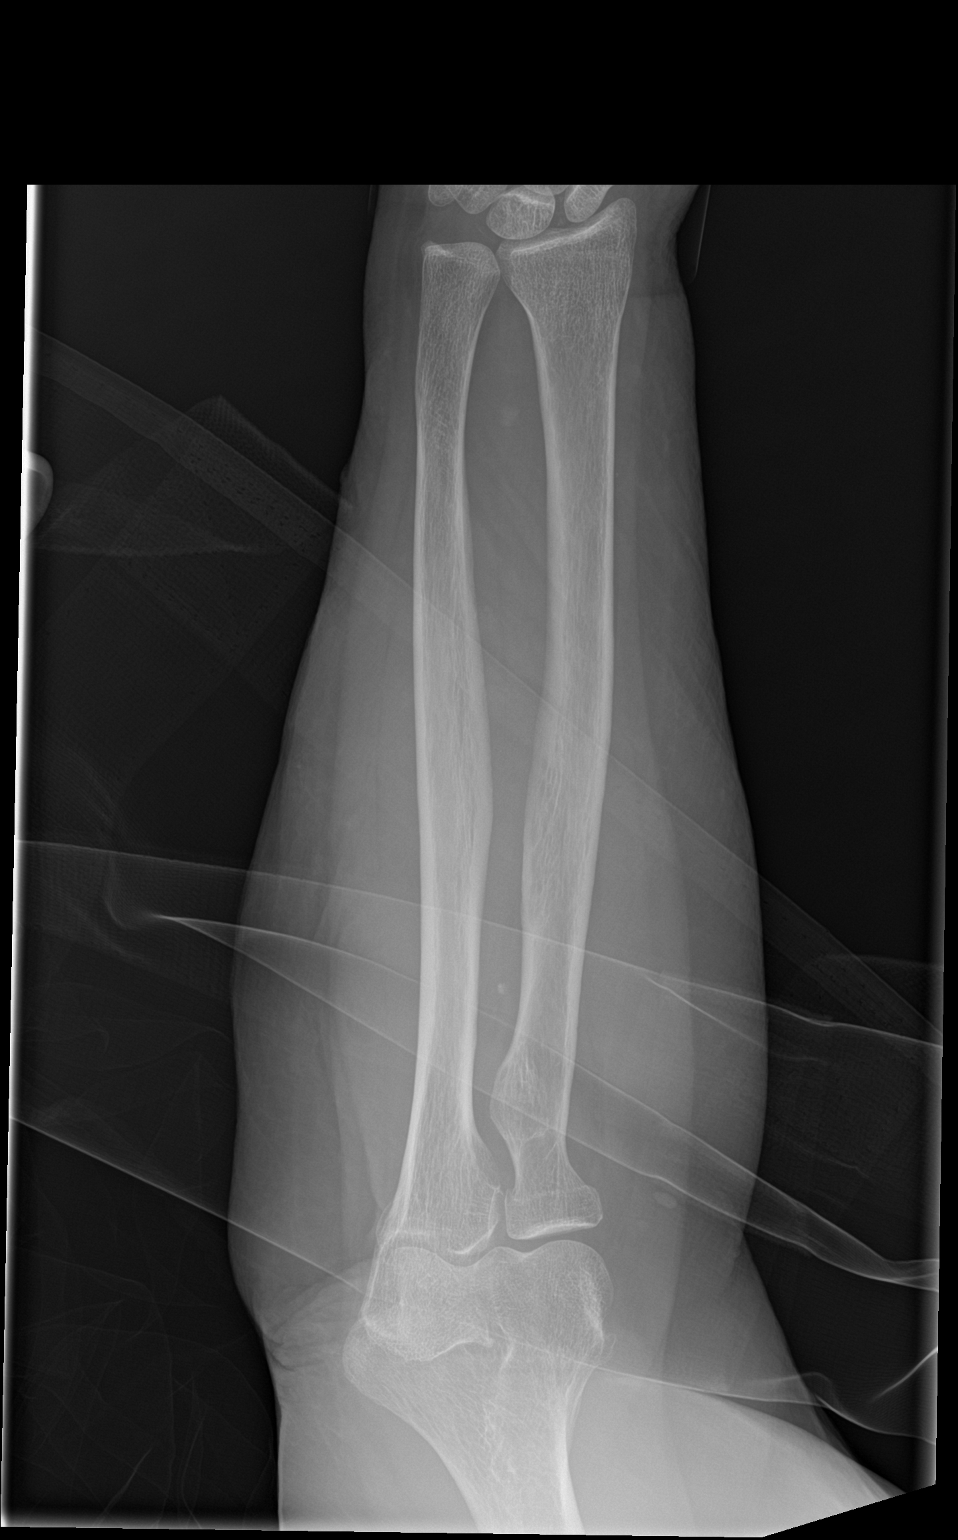
[im 2/2]
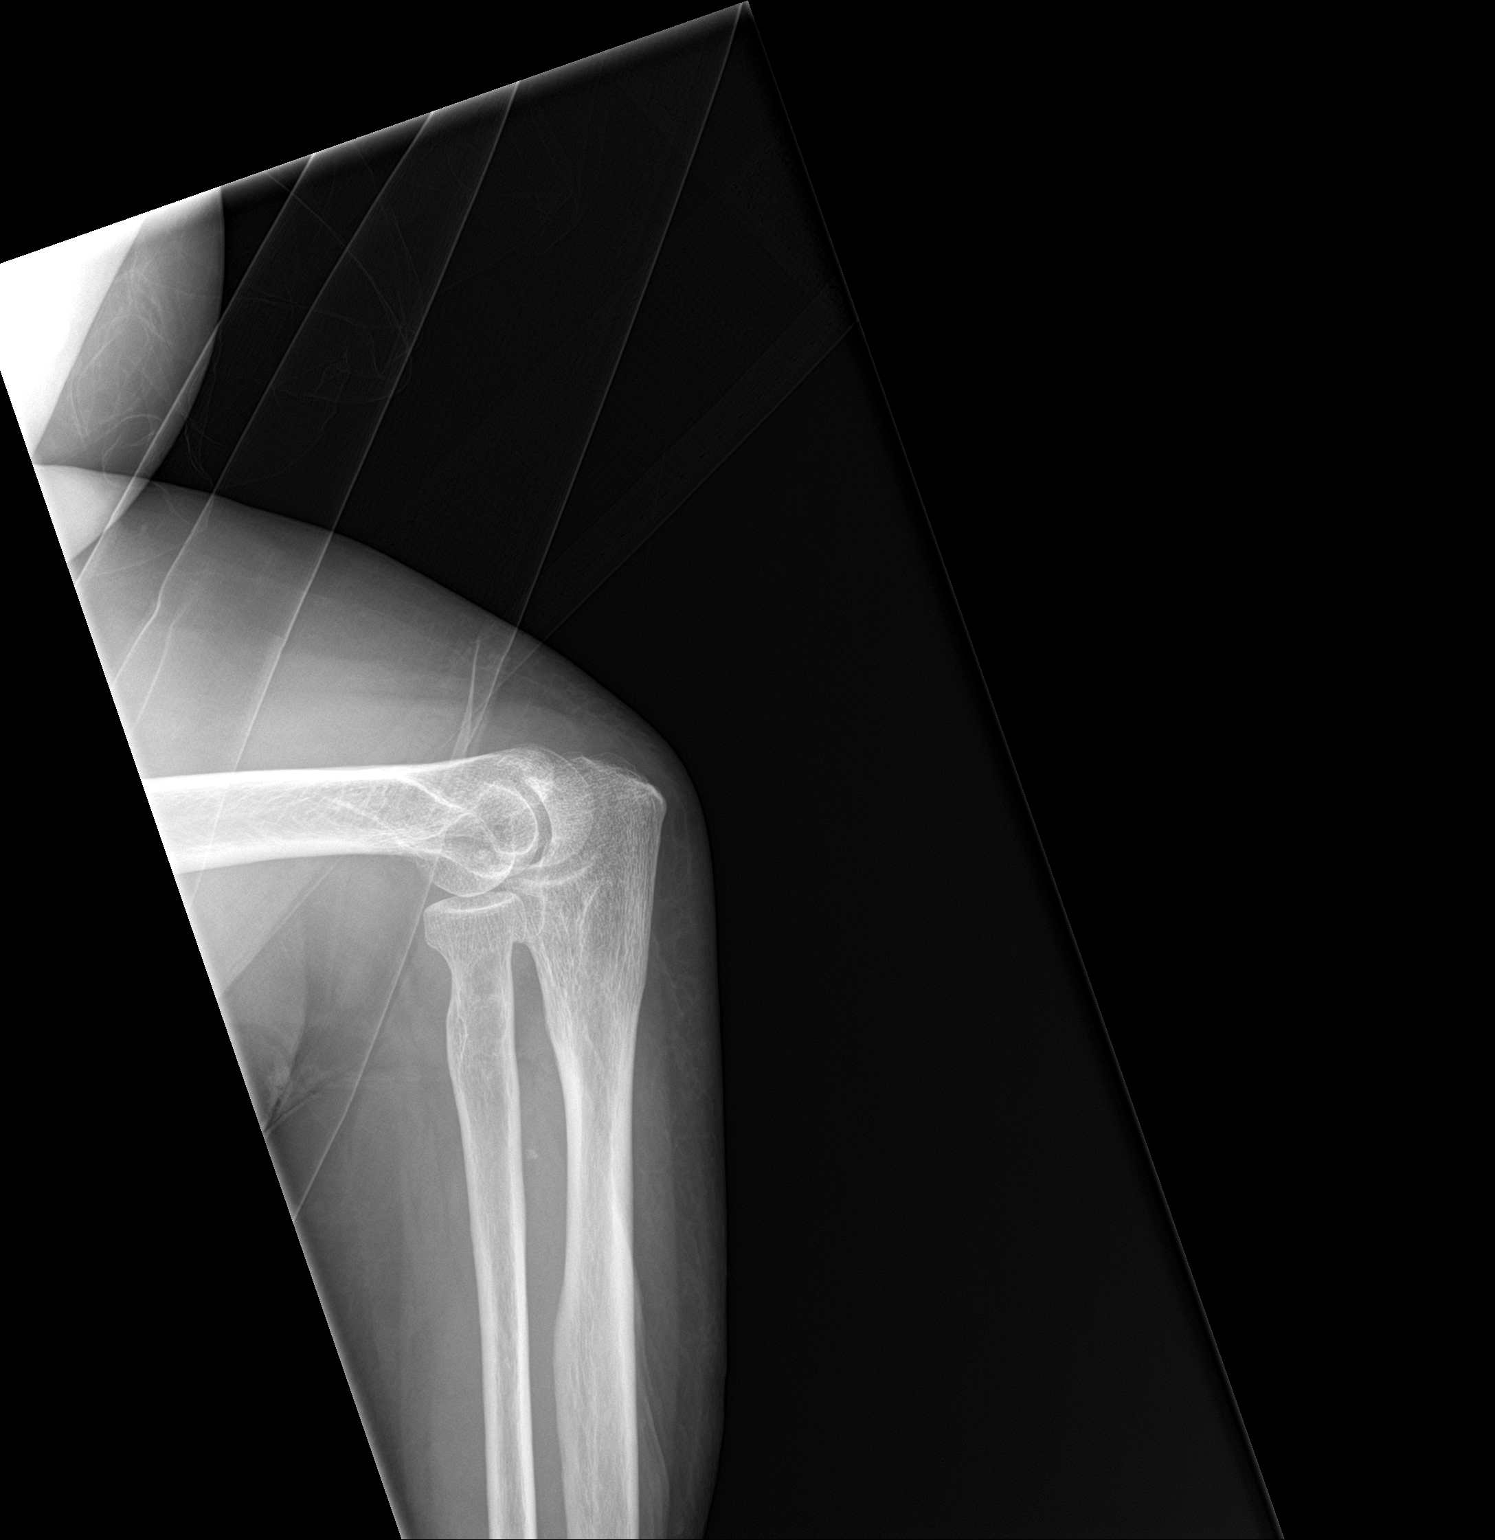

[forearm lat]
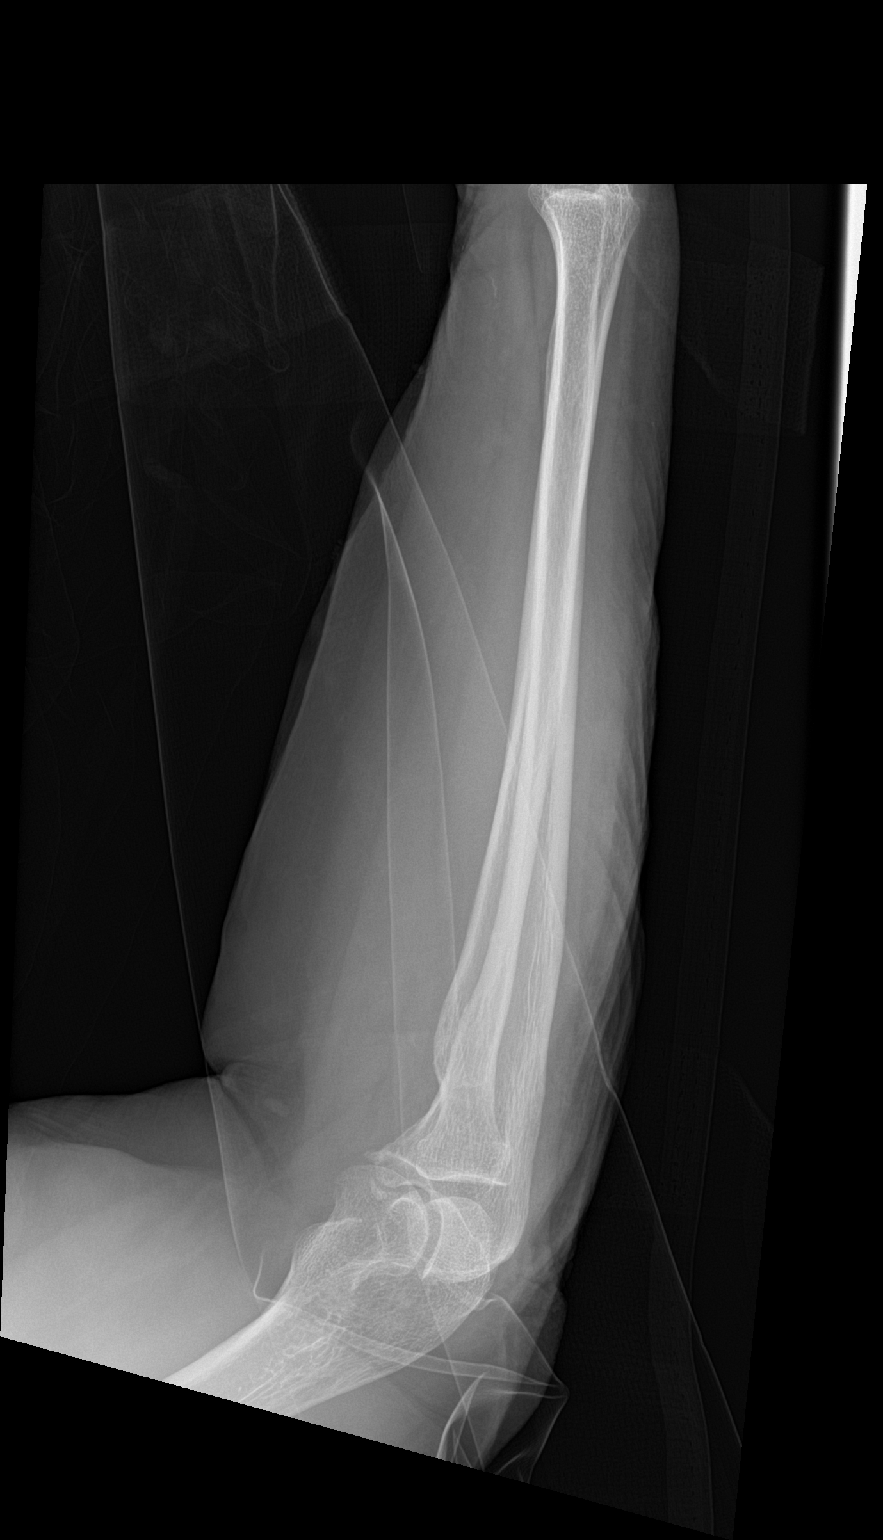

[3 of 3 positions shown; findings below may reference images not displayed]

FINDINGS: There is no evidence of fracture or other focal bone lesions. No
focal soft tissue swelling. Small calcifications within the soft
tissues, nonspecific.
IMPRESSION: Negative.

## 2022-04-13 IMAGING — DX DG HUMERUS 2V *L*
2 series · 2 of 2 positions shown · non-contrast
Comparison: None.

CLINICAL DATA: Pain. Multiple falls this week. Increased weakness.

EXAM:
LEFT HUMERUS - 2+ VIEW

[humerus ap]
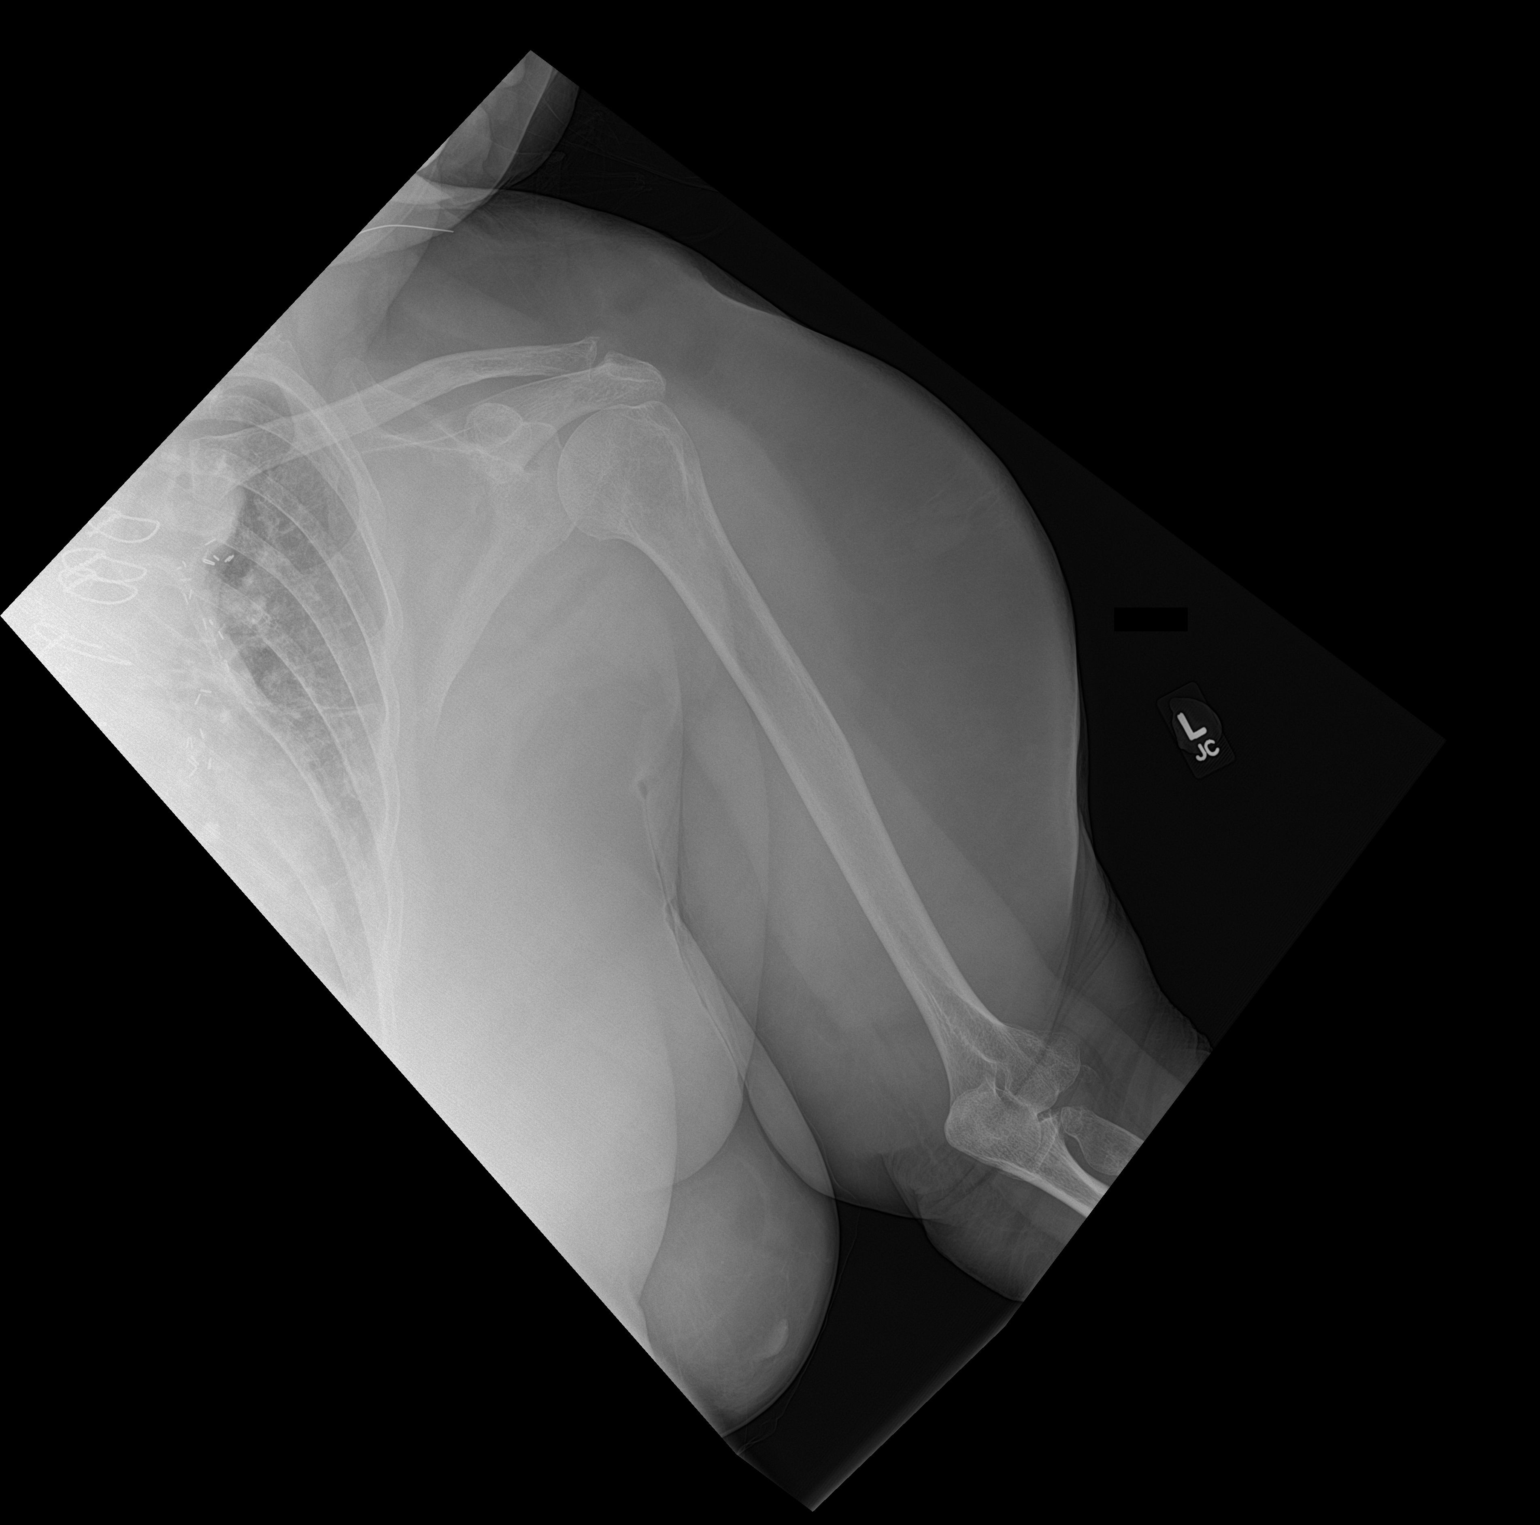

[humerus lat]
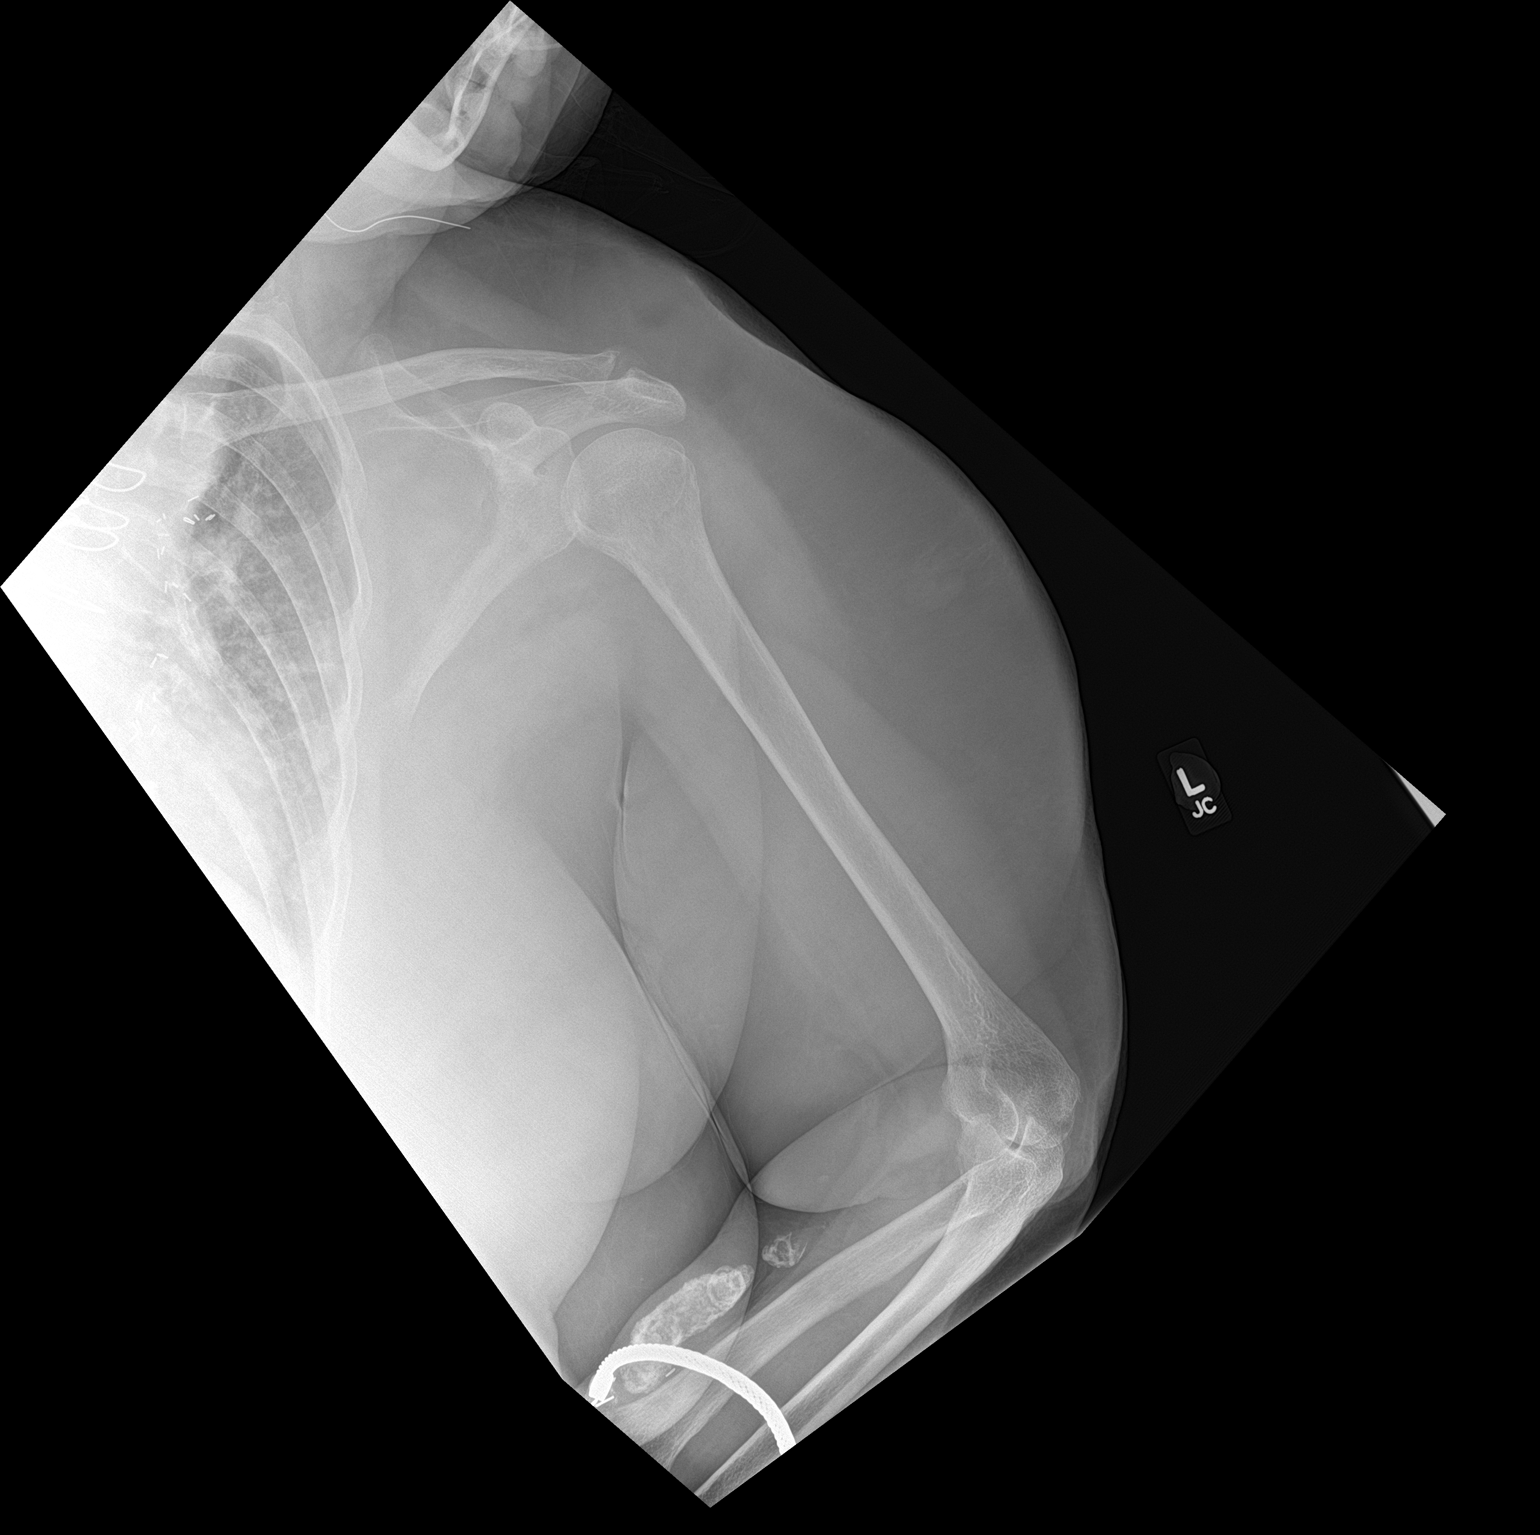

[2 of 2 positions shown; findings below may reference images not displayed]

FINDINGS: The cortical margins of the humerus are intact. There is no evidence
of fracture or other focal bone lesions. Shoulder alignment is
maintained. Elbow alignment is not well assessed on provided views.
Soft tissues are unremarkable.
IMPRESSION: Negative radiographs of the humerus.  No evidence of fracture.

## 2022-04-13 IMAGING — CR DG HIP (WITH OR WITHOUT PELVIS) 2-3V*R*
3 series · 3 of 3 positions shown · non-contrast
Comparison: None.

CLINICAL DATA: 64-year-old female with fall

EXAM:
DG HIP (WITH OR WITHOUT PELVIS) 2-3V RIGHT

[pelvis ap]
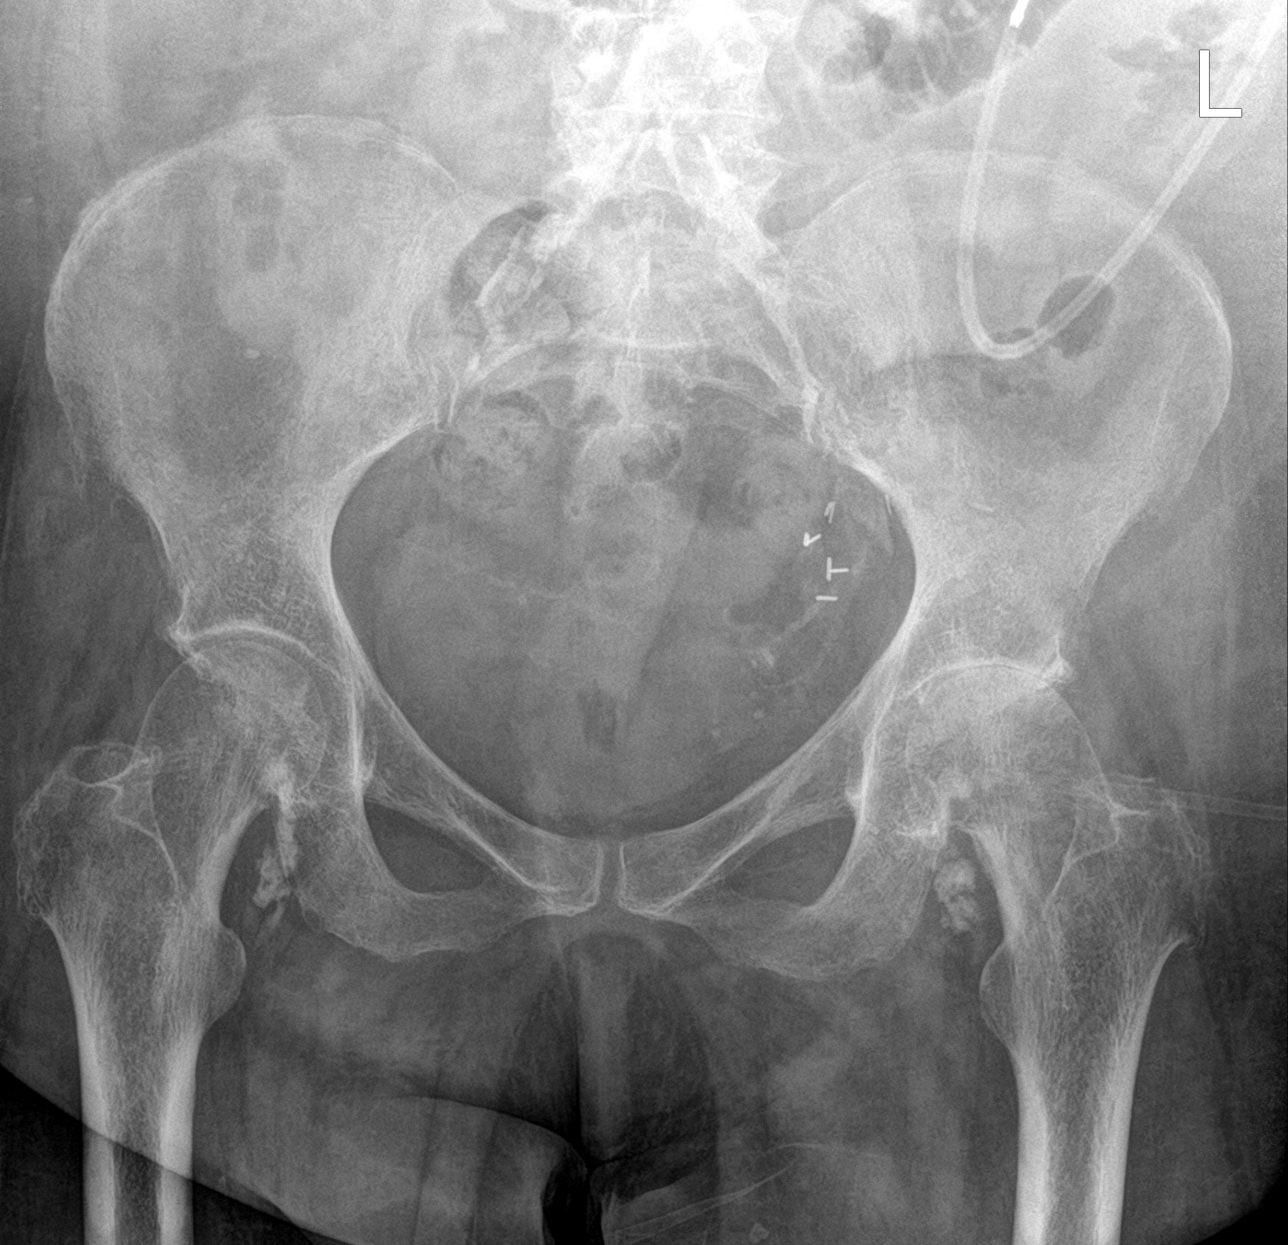

[hip ap]
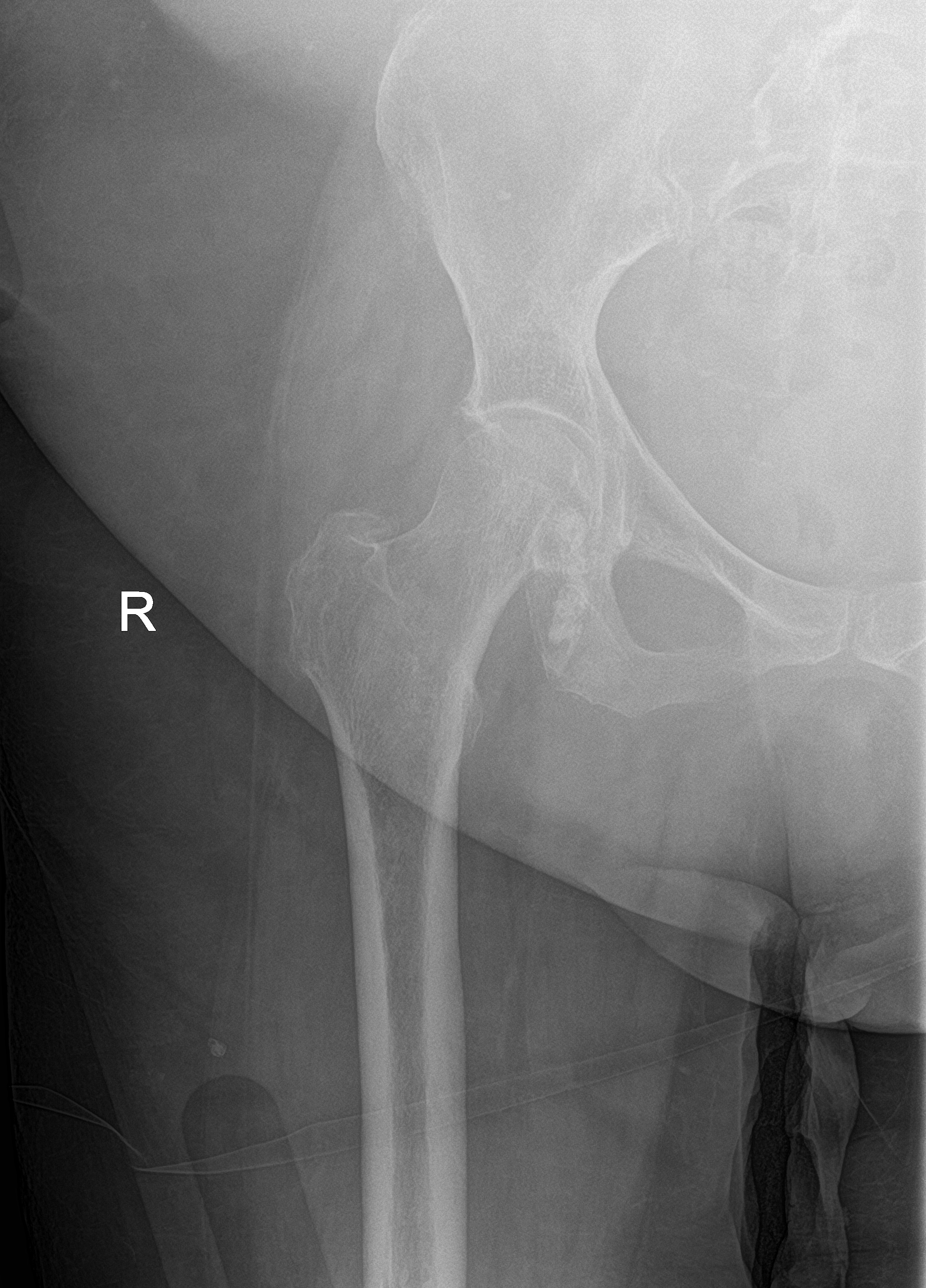

[hip lat]
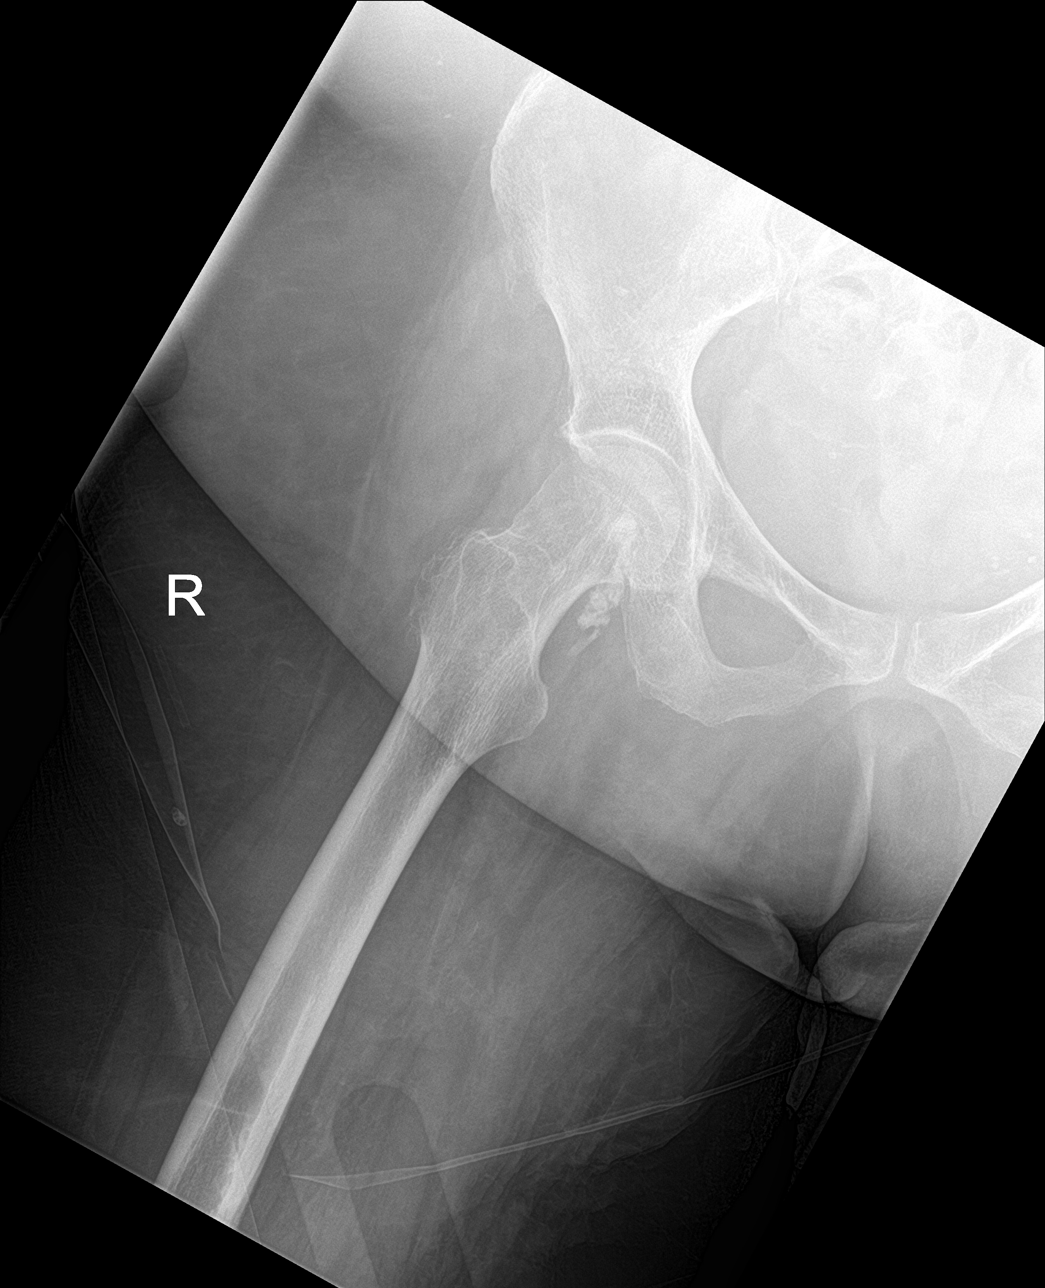

[3 of 3 positions shown; findings below may reference images not displayed]

FINDINGS: Bony pelvic ring appears intact. No acute displaced fracture.
Surgical changes of the left anatomic pelvis.

Bilateral hips projects normally over the acetabula.

Degenerative changes of the left greater than right hip.

Vascular calcifications of the proximal femoral vasculature and of
the iliac segment.

Unremarkable proximal right femur
IMPRESSION: Negative for acute bony abnormality.

Osteoarthritis

Atherosclerosis

## 2022-04-13 IMAGING — CR DG HIP (WITH OR WITHOUT PELVIS) 2-3V*L*
2 series · 2 of 2 positions shown · non-contrast
Comparison: None.

CLINICAL DATA: Patient arise from home following multiple falls.

EXAM:
DG HIP (WITH OR WITHOUT PELVIS) 2-3V LEFT

[hip ap]
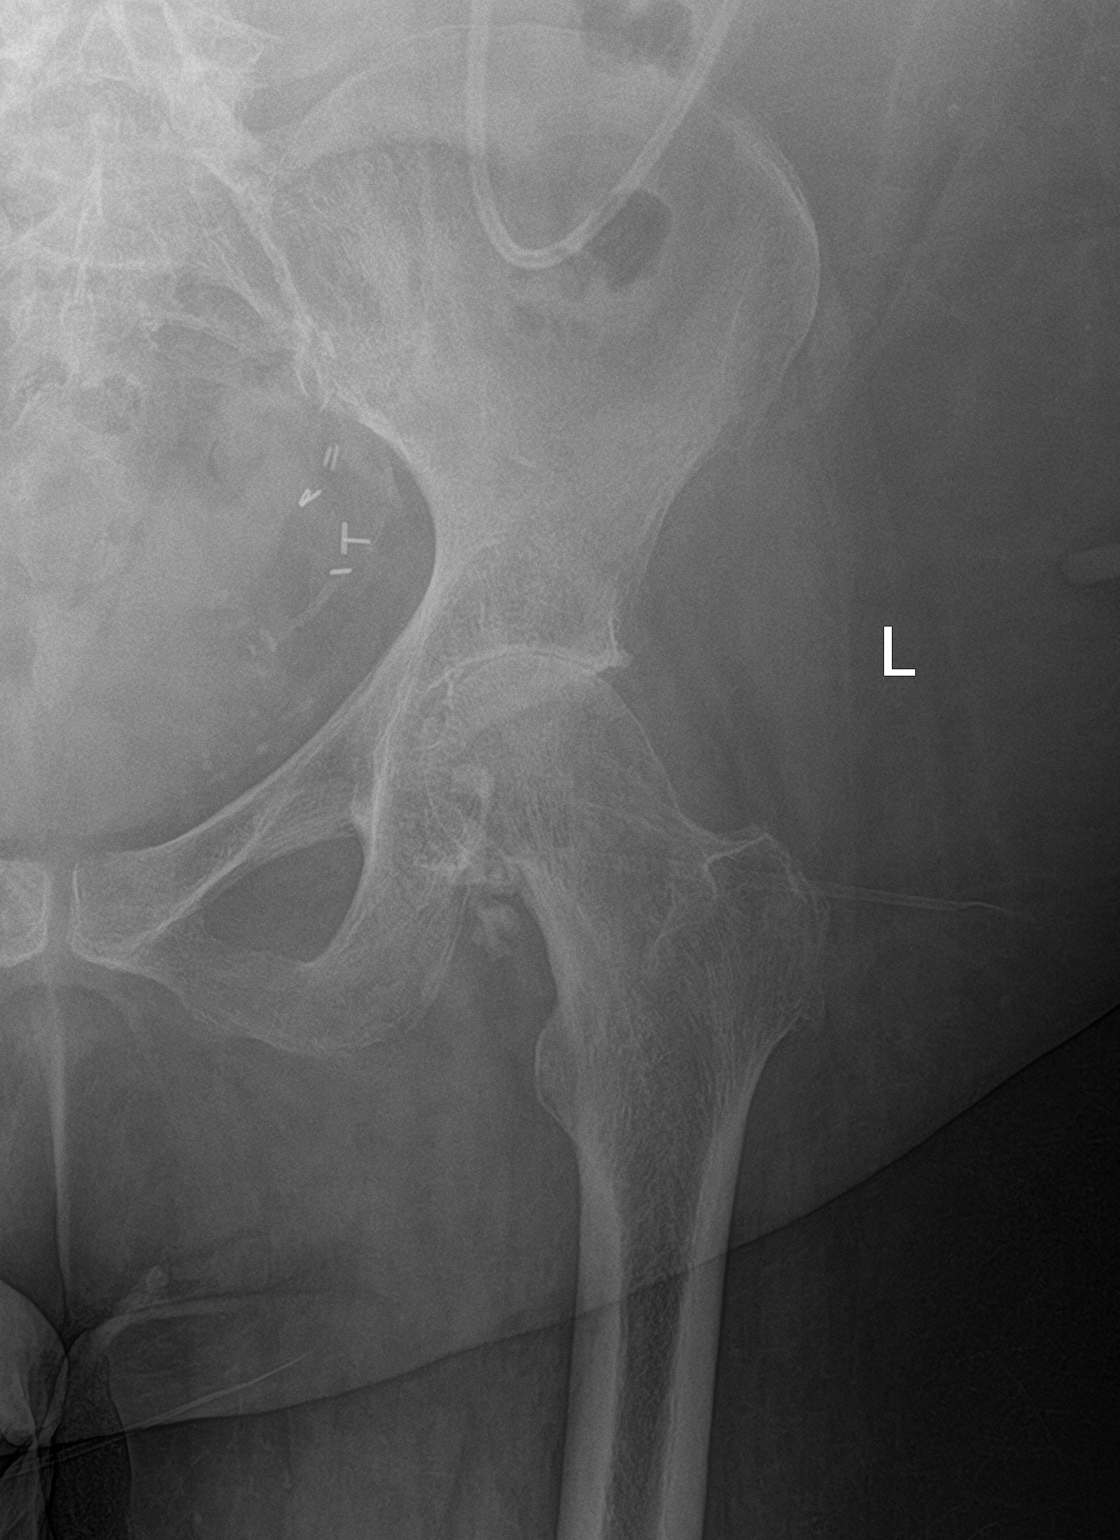

[hip lat]
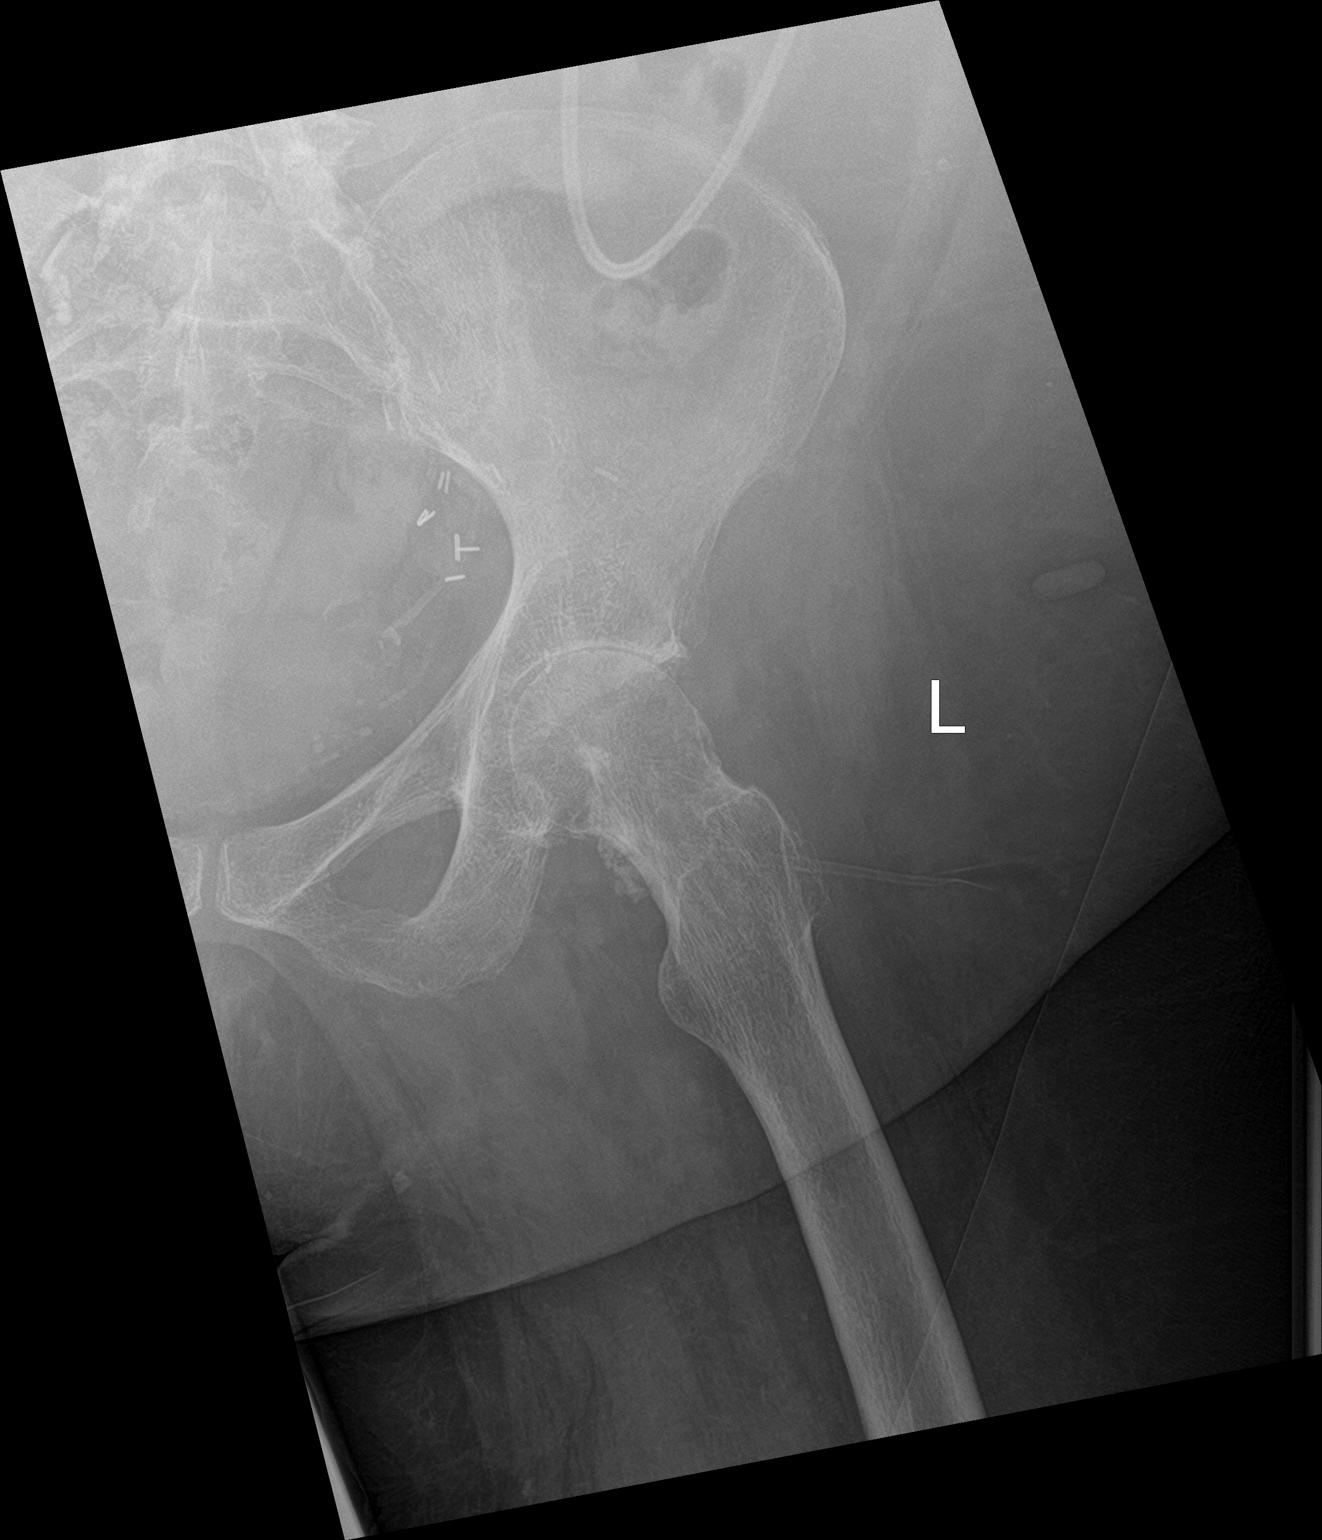

[2 of 2 positions shown; findings below may reference images not displayed]

FINDINGS: Degenerative changes in the hip with loss of joint space. No
fracture or dislocation.
IMPRESSION: Degenerative changes.  No fracture or dislocation.

## 2022-04-13 MED ORDER — APIXABAN 5 MG PO TABS
5.0000 mg | ORAL_TABLET | Freq: Two times a day (BID) | ORAL | 2 refills | Status: DC
Start: 2022-04-13 — End: 2022-07-10

## 2022-04-13 NOTE — Telephone Encounter (Signed)
Patient needs her eliquis refilled - please send to [place], [place] - Patient is completely out of medication

## 2022-04-15 ENCOUNTER — Other Ambulatory Visit: Payer: Self-pay | Admitting: Emergency Medicine

## 2022-04-26 IMAGING — DX DG FEMUR 1V*R*
2 series · 2 of 2 positions shown · non-contrast
Comparison: Pelvic radiograph dated 03/30/2021.

CLINICAL DATA: Level 2 trauma.  Fall.

EXAM:
RIGHT FEMUR 1 VIEW; PORTABLE PELVIS 1-2 VIEWS

[femur ap (1 of 2)]
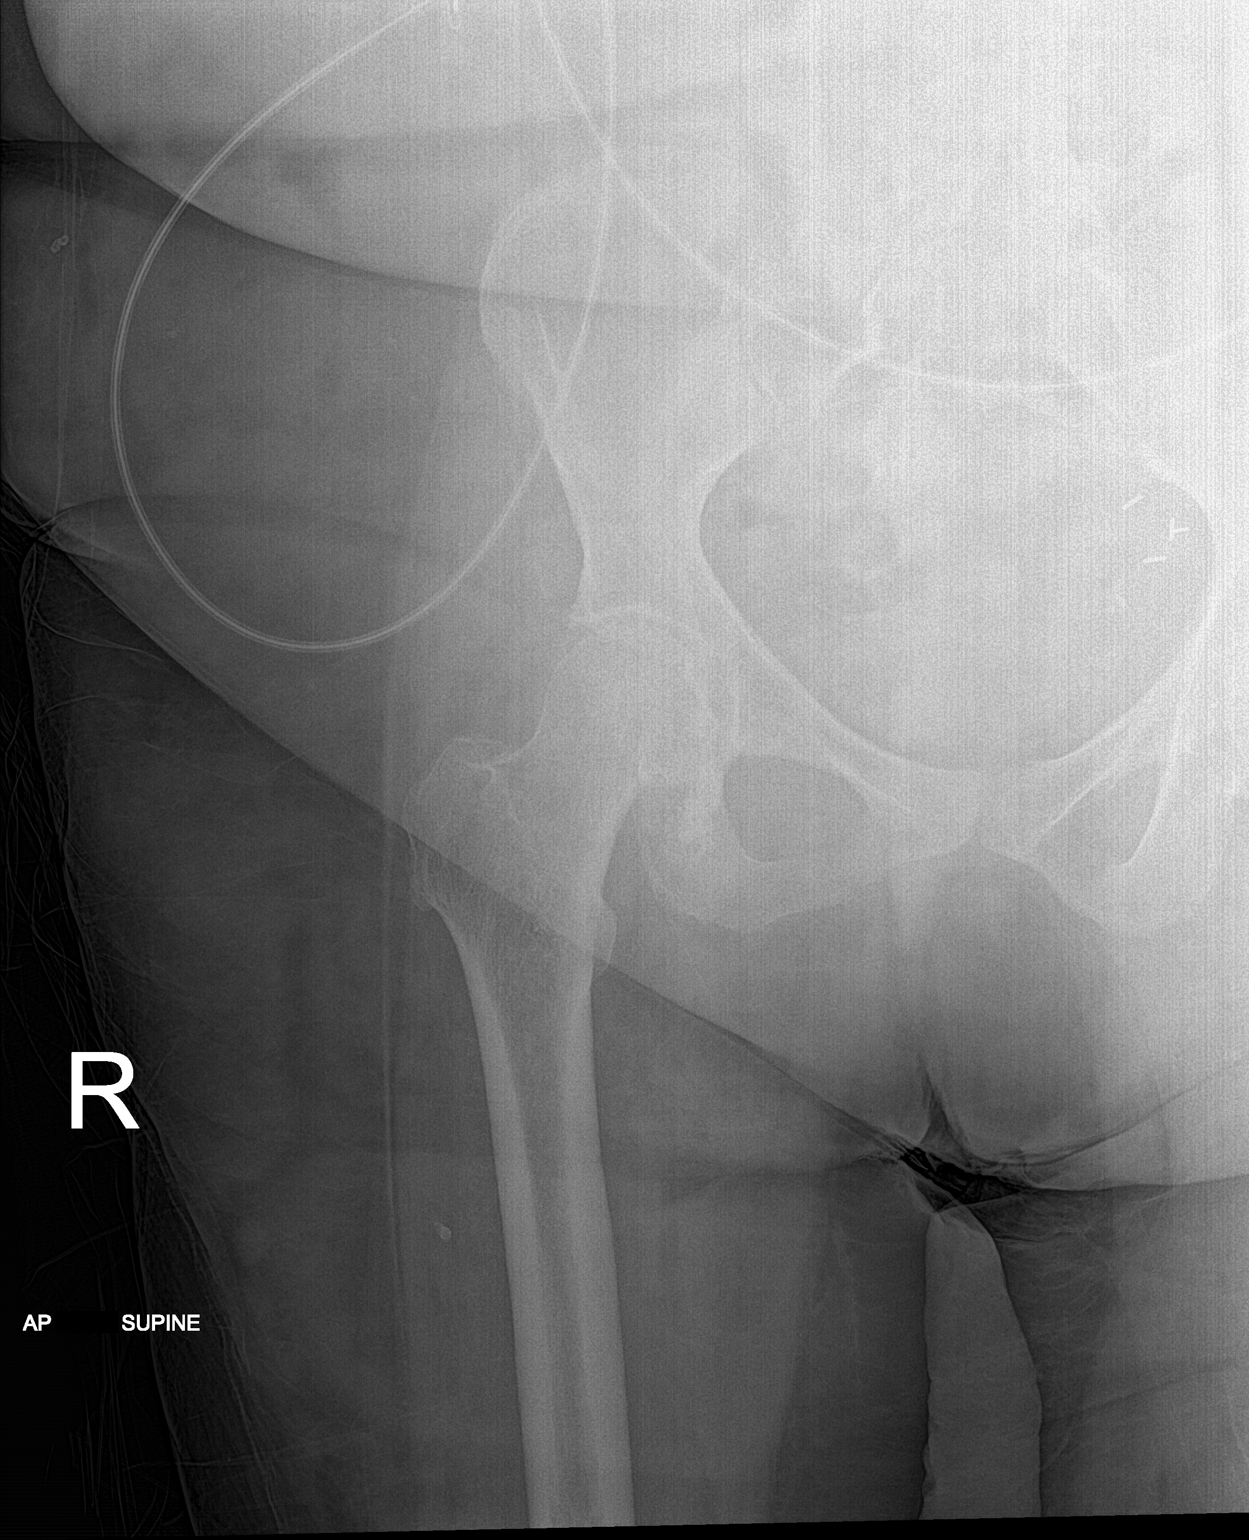

[femur ap (2 of 2)]
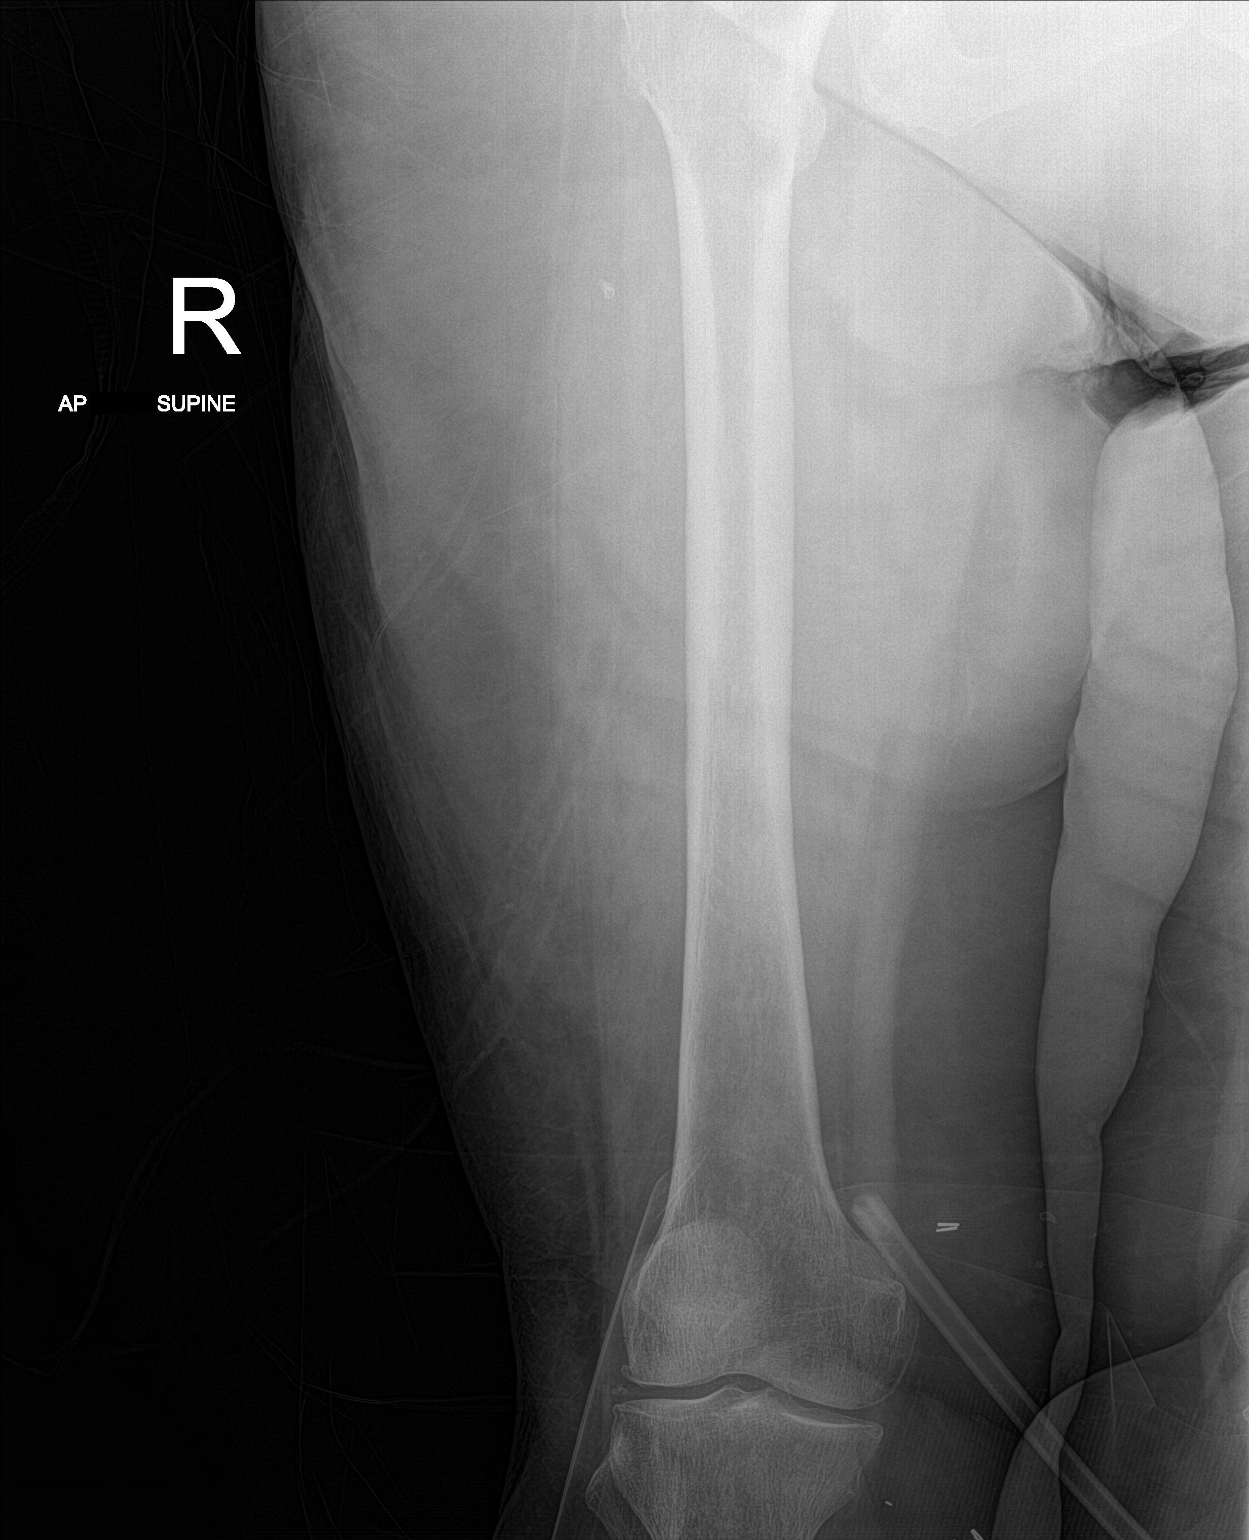

[2 of 2 positions shown; findings below may reference images not displayed]

FINDINGS: Evaluation is limited due to osteopenia and body habitus.

No acute fracture or dislocation. Osteopenia. Degenerative changes
of the right hip. The soft tissues are grossly unremarkable. Several
surgical staples noted over the pelvis.
IMPRESSION: No acute findings.

## 2022-04-26 IMAGING — DX DG SHOULDER 1V*L*
3 series · 3 of 3 positions shown · non-contrast
Comparison: None.

CLINICAL DATA: Level 2 fall

EXAM:
LEFT SHOULDER

[shoulder ap]
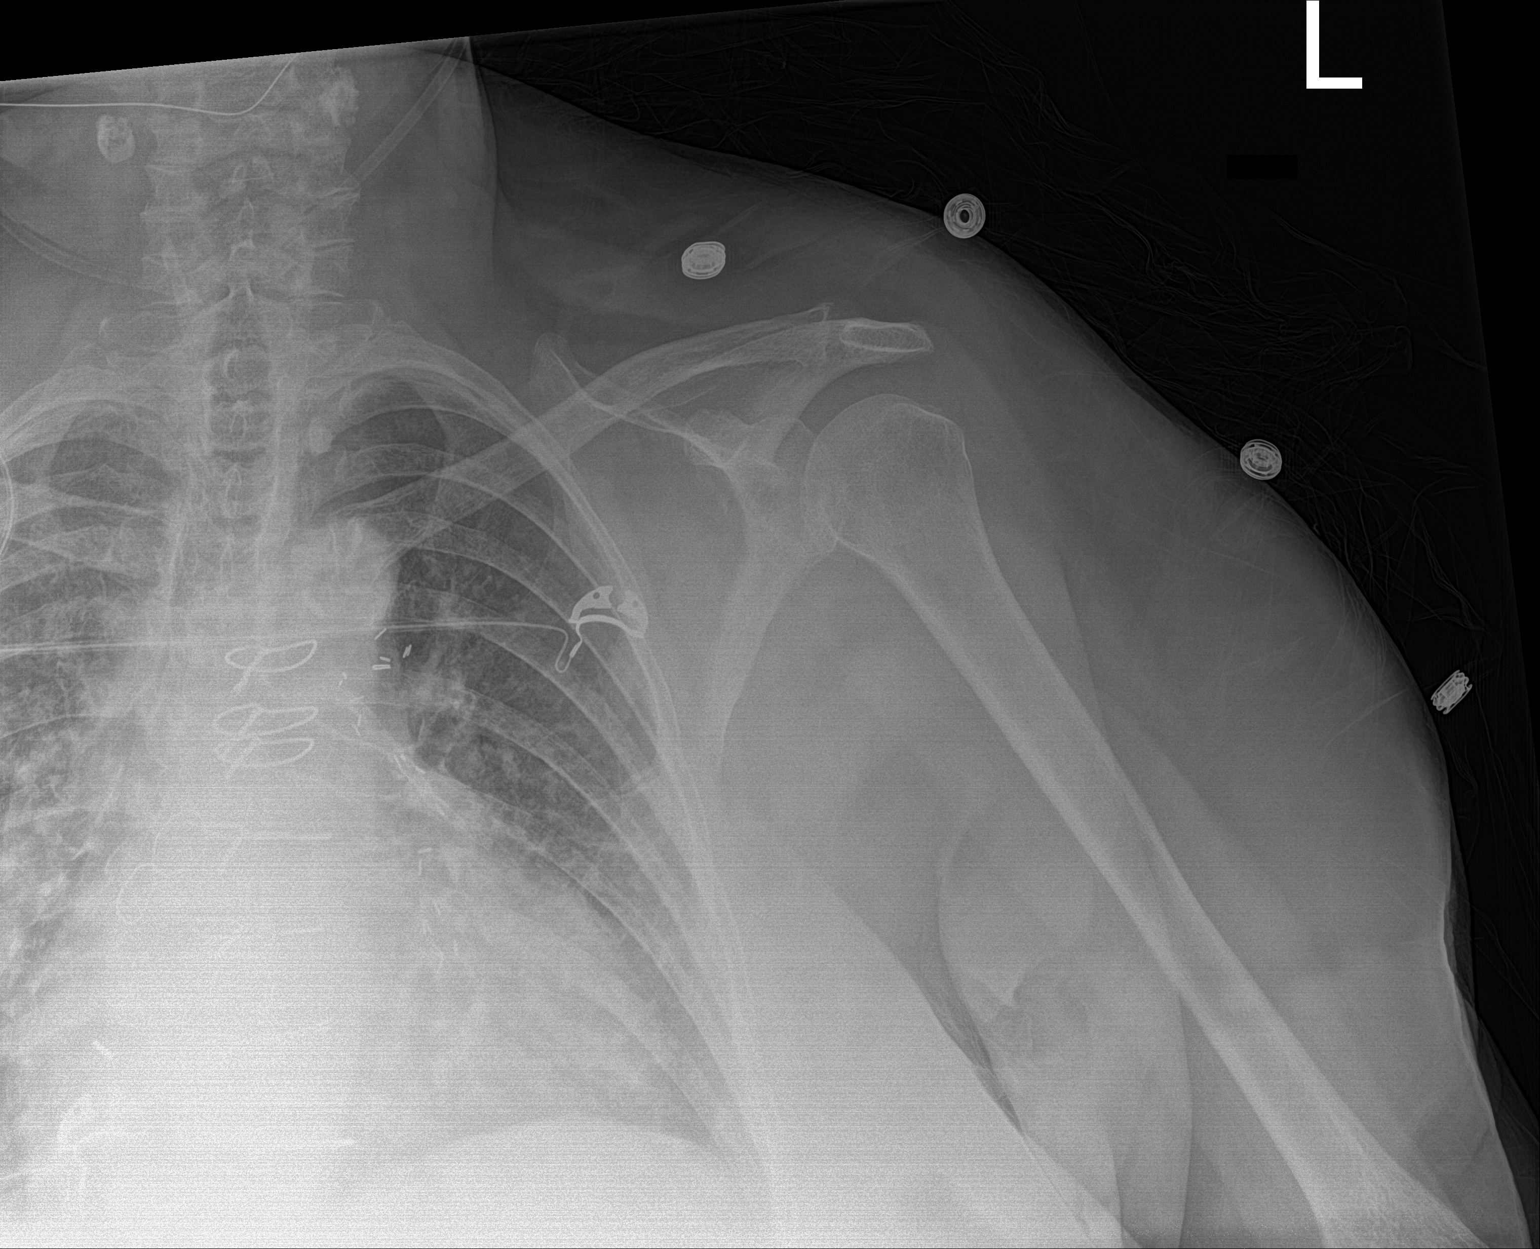

[shoulder obl (1 of 2)]
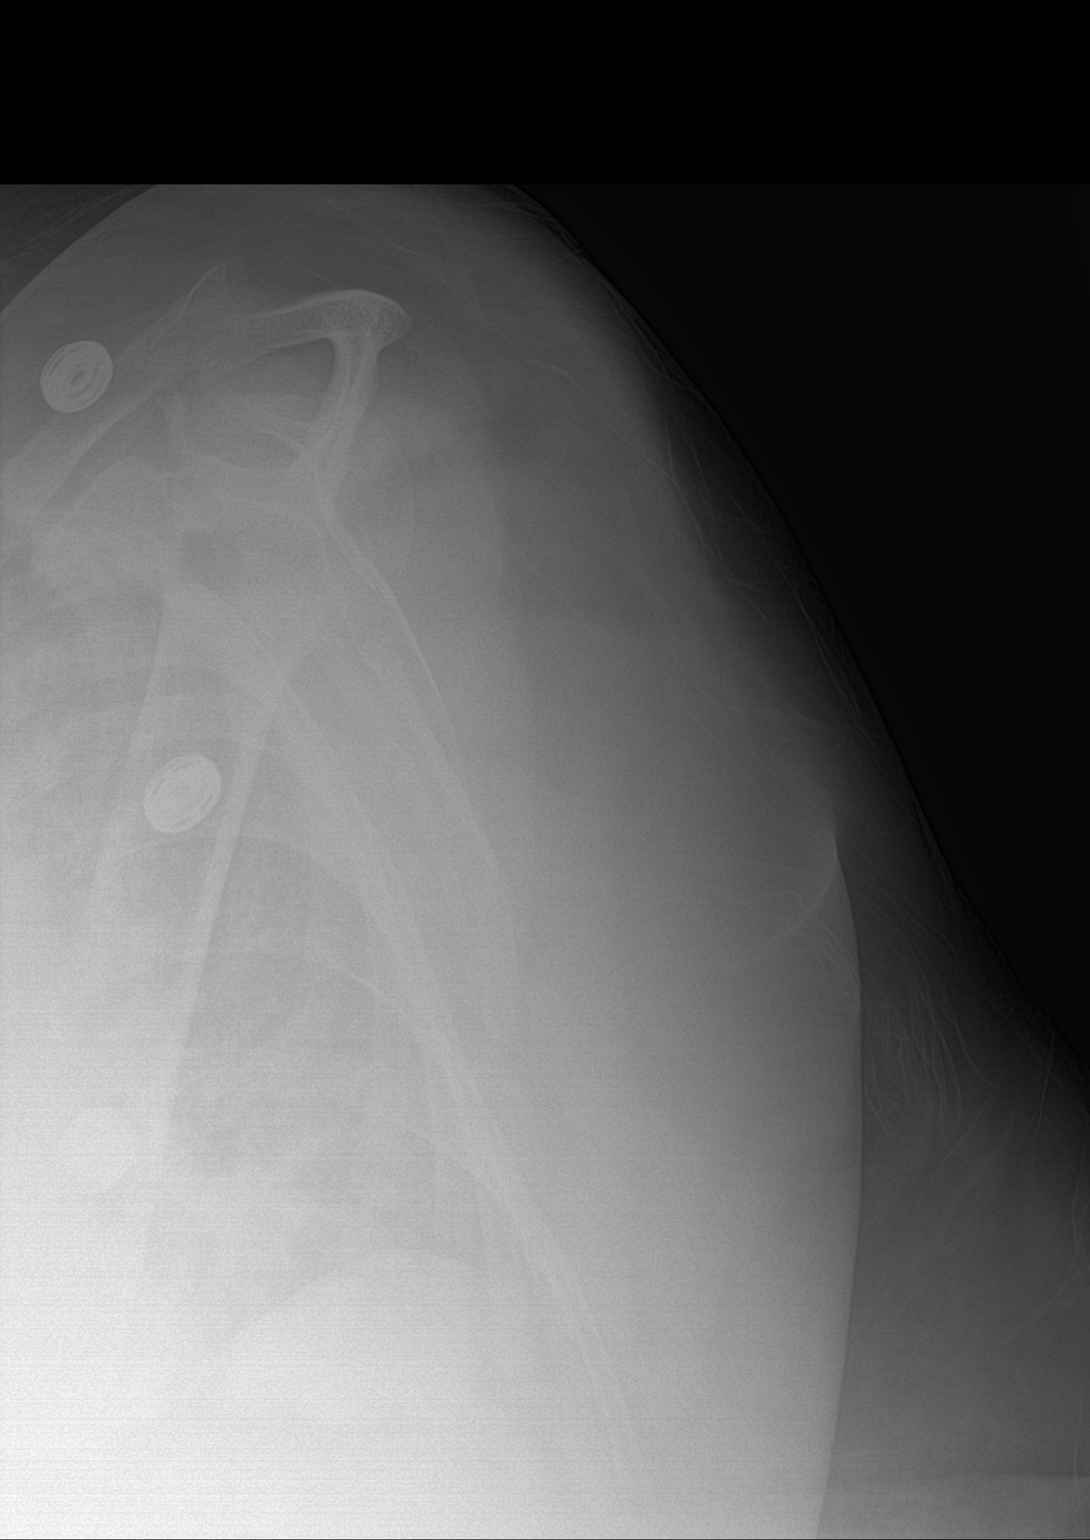

[shoulder obl (2 of 2)]
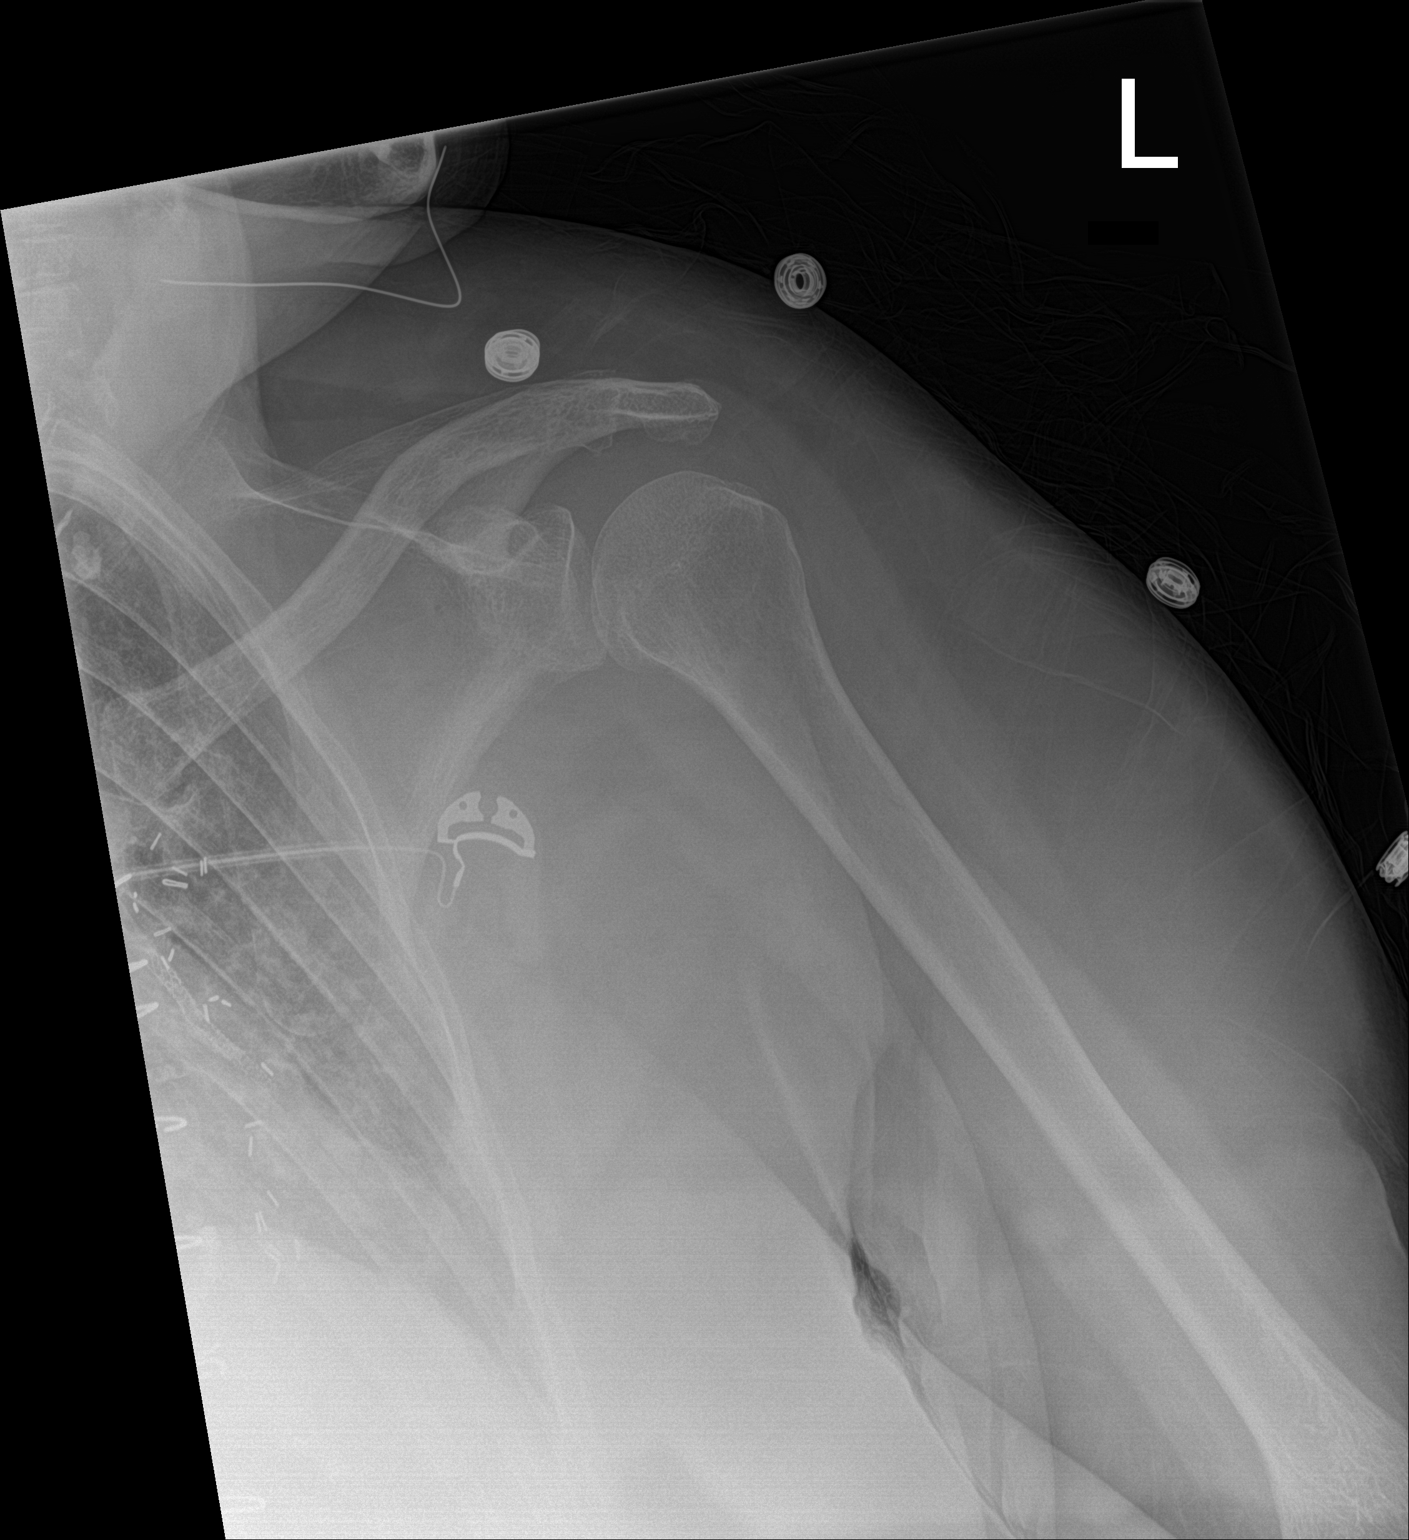

[3 of 3 positions shown; findings below may reference images not displayed]

FINDINGS: No fracture or dislocation is seen.

The joint spaces are preserved.

Visualized soft tissues are within normal limits.
IMPRESSION: Negative.

## 2022-04-26 IMAGING — DX DG PORTABLE PELVIS
1 series · 1 of 1 positions shown · non-contrast
Comparison: Pelvic radiograph dated 03/30/2021.

CLINICAL DATA: Level 2 trauma.  Fall.

EXAM:
RIGHT FEMUR 1 VIEW; PORTABLE PELVIS 1-2 VIEWS

[pelvis ap]
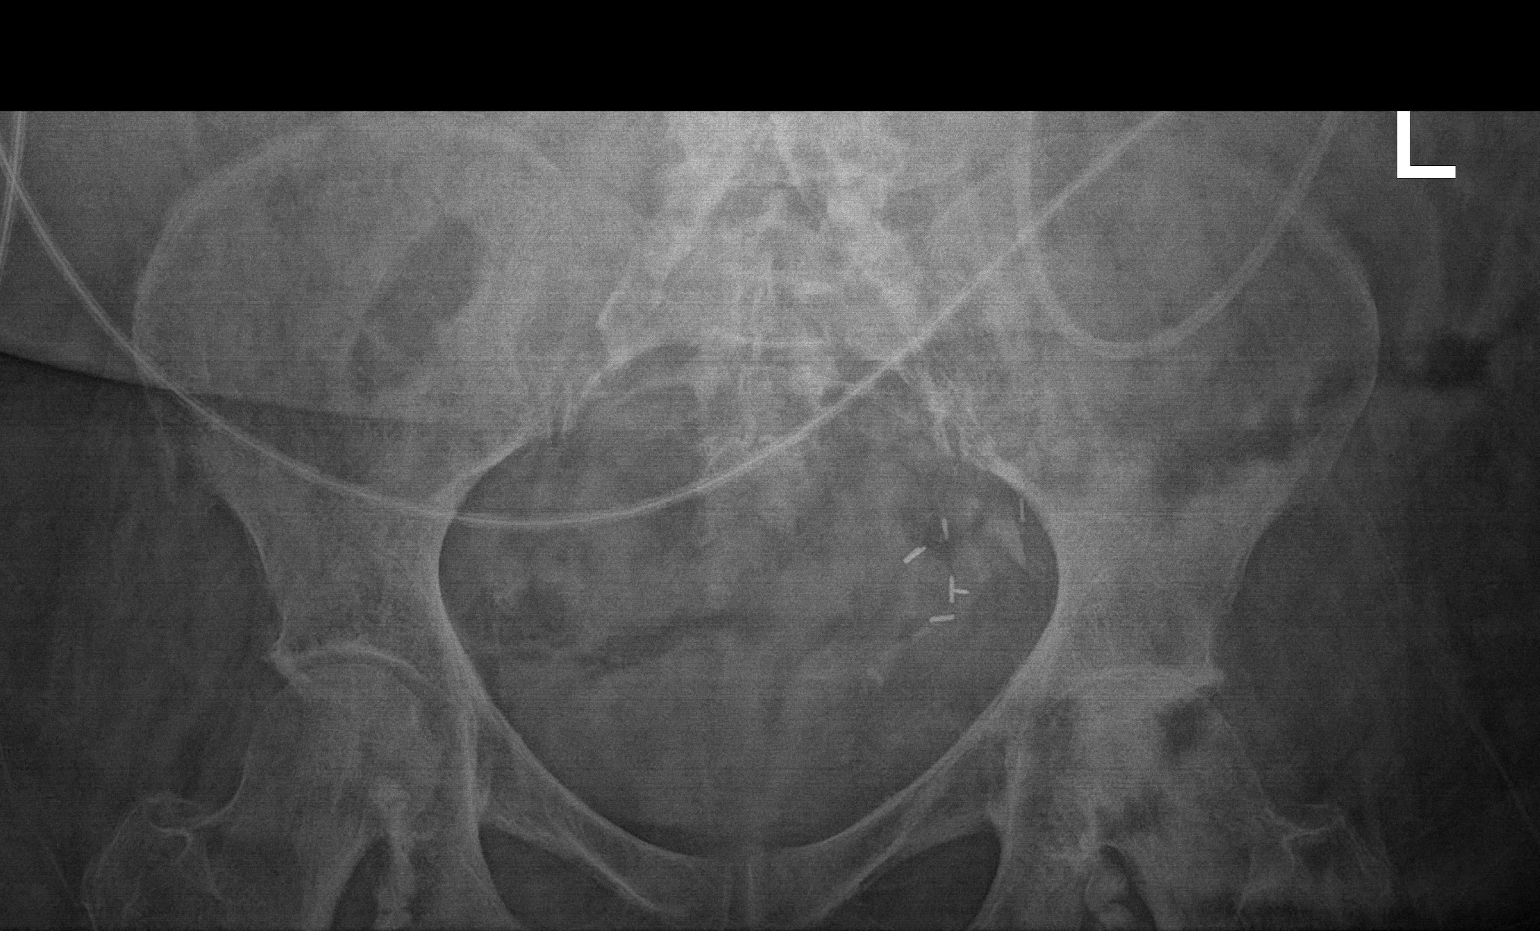

[1 of 1 positions shown; findings below may reference images not displayed]

FINDINGS: Evaluation is limited due to osteopenia and body habitus.

No acute fracture or dislocation. Osteopenia. Degenerative changes
of the right hip. The soft tissues are grossly unremarkable. Several
surgical staples noted over the pelvis.
IMPRESSION: No acute findings.

## 2022-04-26 IMAGING — DX DG CHEST 1V PORT
1 series · 1 of 1 positions shown · non-contrast
Comparison: 03/30/2021

CLINICAL DATA: Level 2 fall

EXAM:
PORTABLE CHEST 1 VIEW

[chest ap]
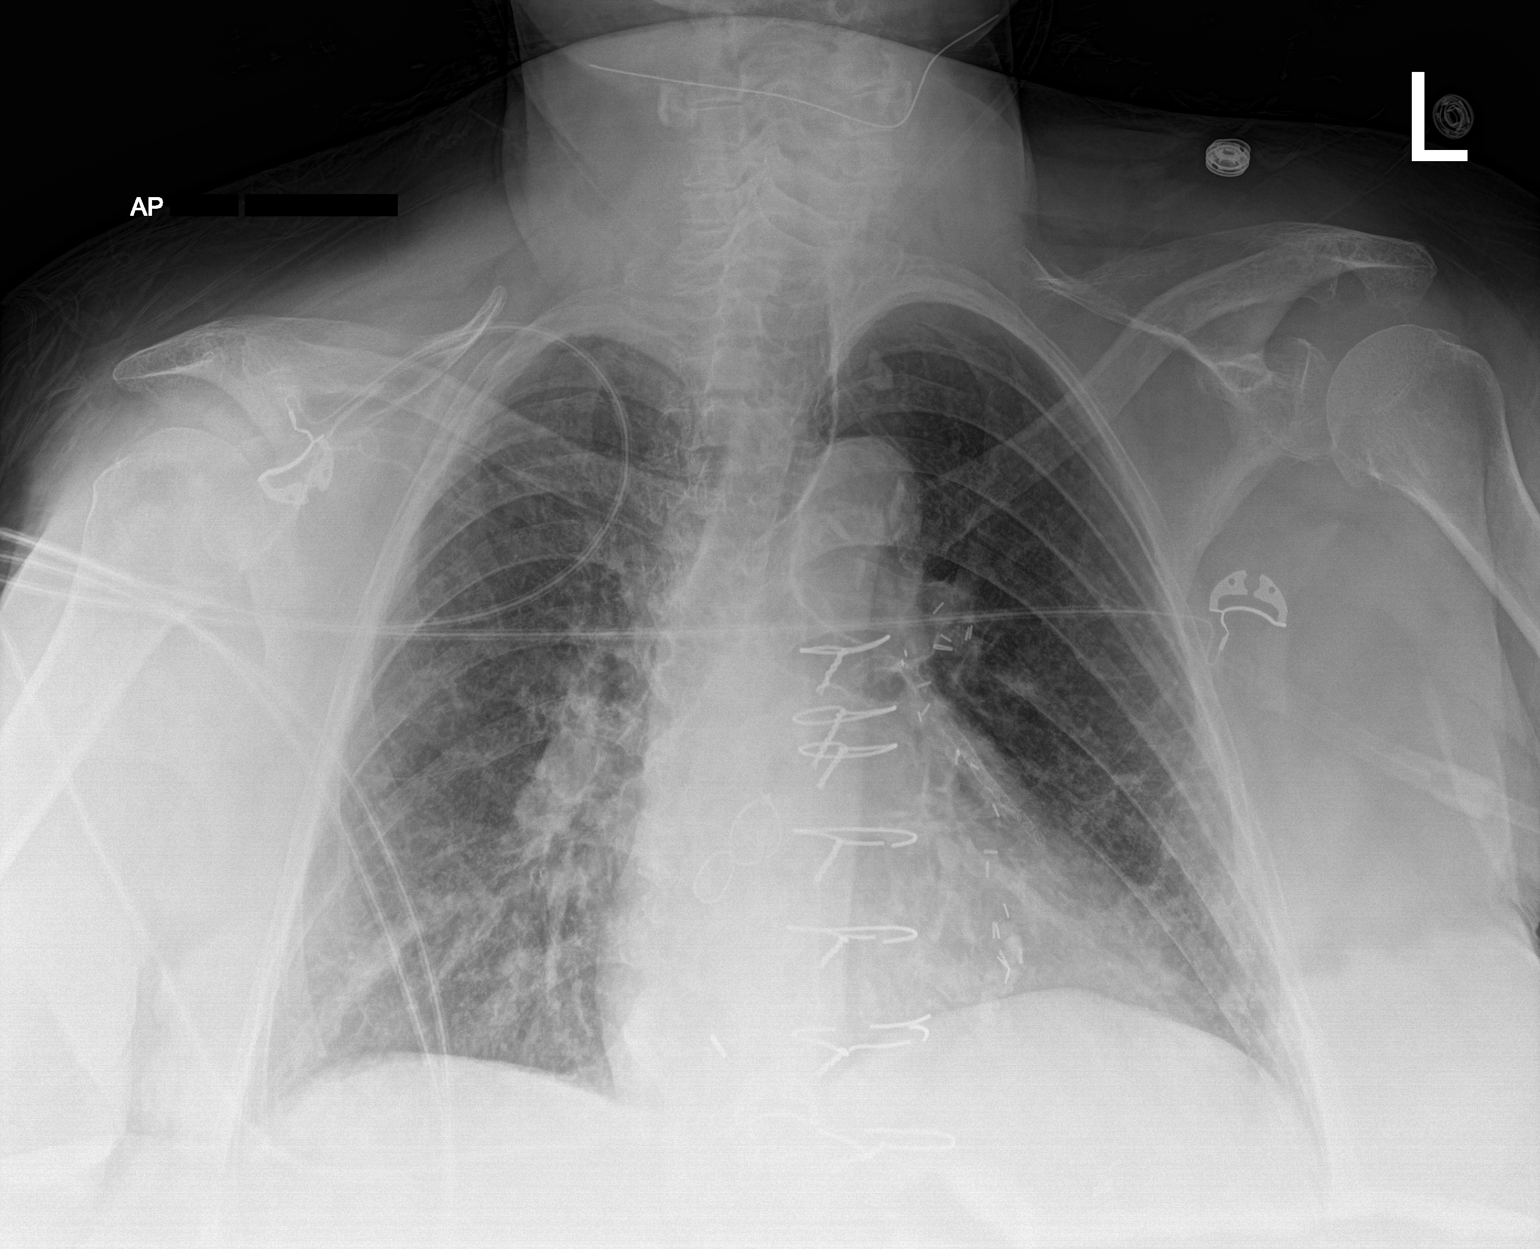

[1 of 1 positions shown; findings below may reference images not displayed]

FINDINGS: Subpleural opacities/reticulation in the lungs bilaterally, chronic,
favoring interstitial lung disease. Atypical infection is also
possible but is not favored given lack of interval change. No
pleural effusion or pneumothorax.

The heart is normal in size. Postsurgical changes related to prior
CABG.

Median sternotomy.
IMPRESSION: Suspected chronic interstitial lung disease.

No evidence of acute cardiopulmonary disease.

## 2022-04-26 IMAGING — CT CT HEAD W/O CM
3 series · 17 of 37 positions shown, 19 images · non-contrast
Comparison: 03/30/2021

CLINICAL DATA: Head trauma

EXAM:
CT HEAD WITHOUT CONTRAST
TECHNIQUE: Contiguous axial images were obtained from the base of the skull
through the vertex without intravenous contrast.

[Series 3: head without · axial · non-contrast · 0.39mm/px · z∈[-176,-41]mm · 7 of 37 slices shown, 9 images]
[im 5/37  brain]
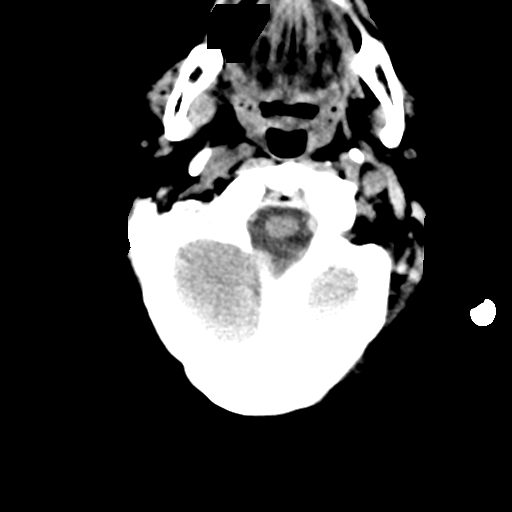
[im 5/37  bone]
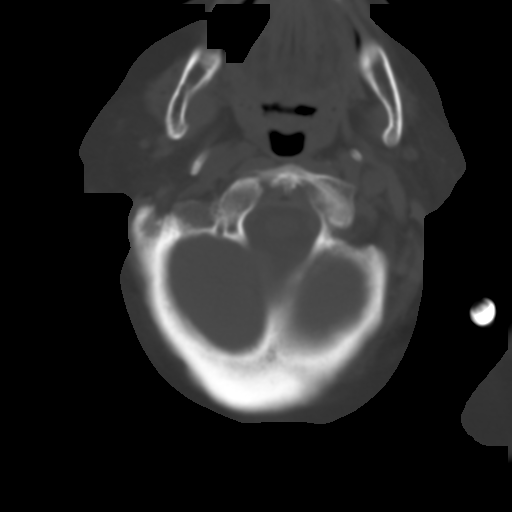
[im 10/37  brain]
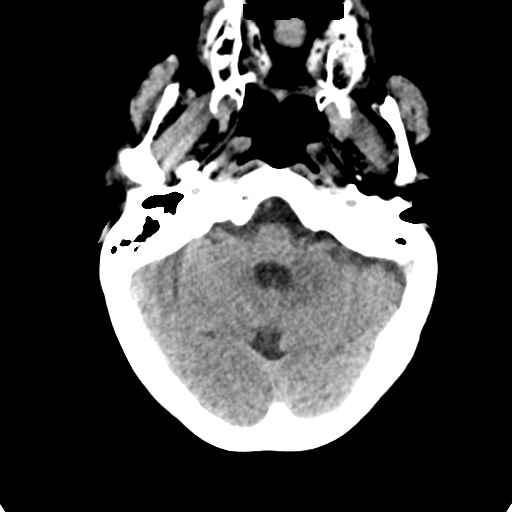
[im 14/37  brain]
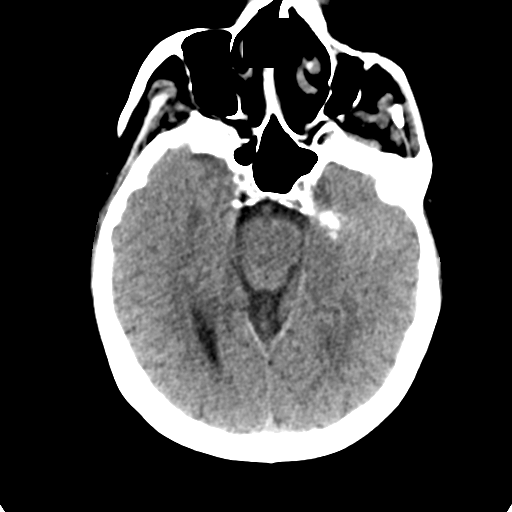
[im 19/37  brain]
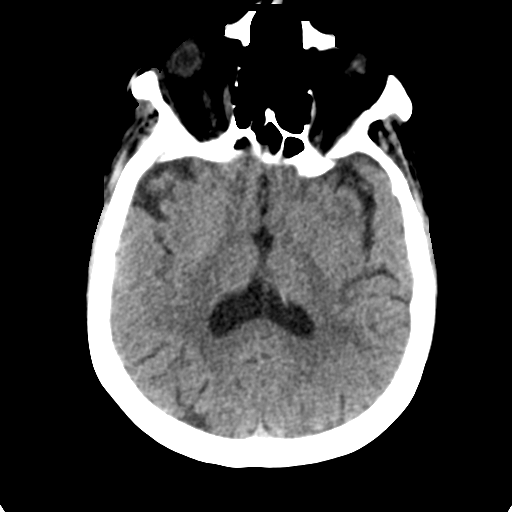
[im 23/37  brain]
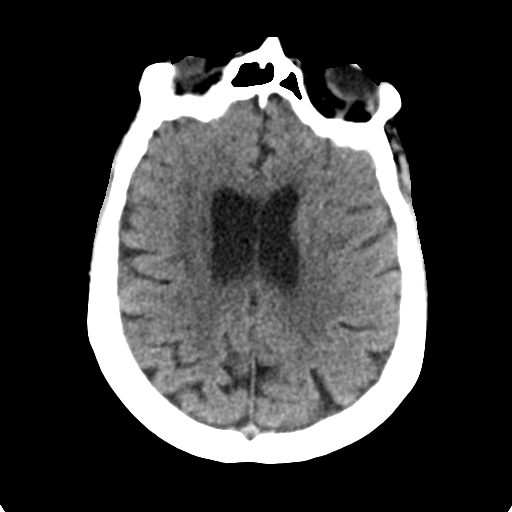
[im 23/37  bone]
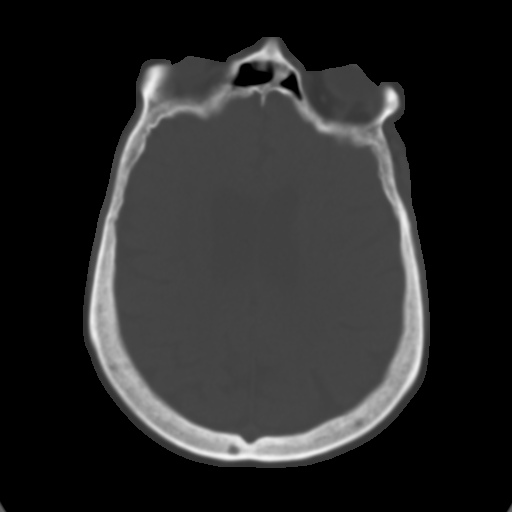
[im 28/37  brain]
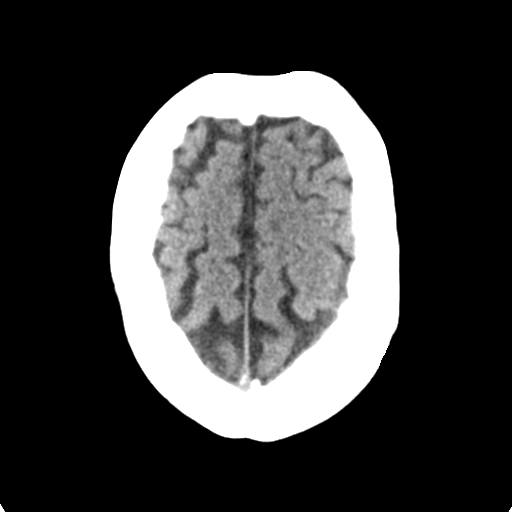
[im 32/37  brain]
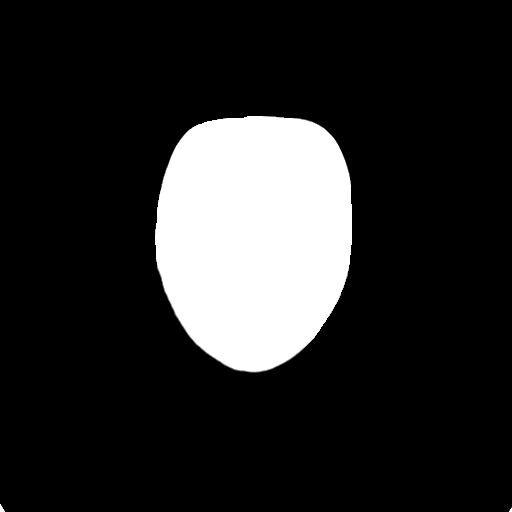

[Series 4: head bone · axial · 0.39mm/px · z∈[-178,-52]mm · 7 of 91 slices shown]
[im 10/91  bone]
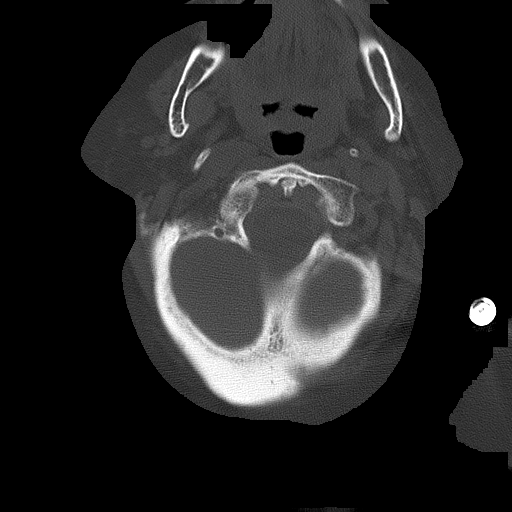
[im 19/91  bone]
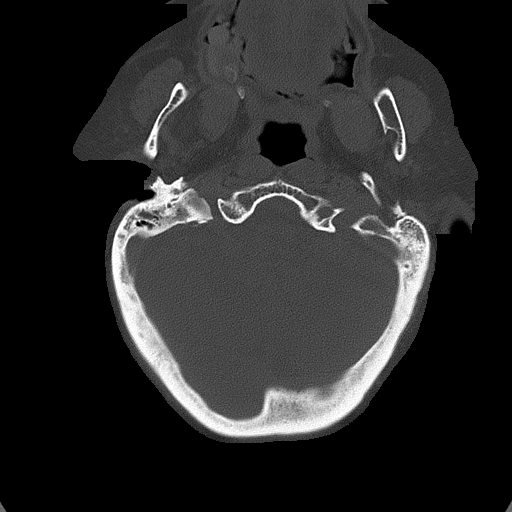
[im 28/91  bone]
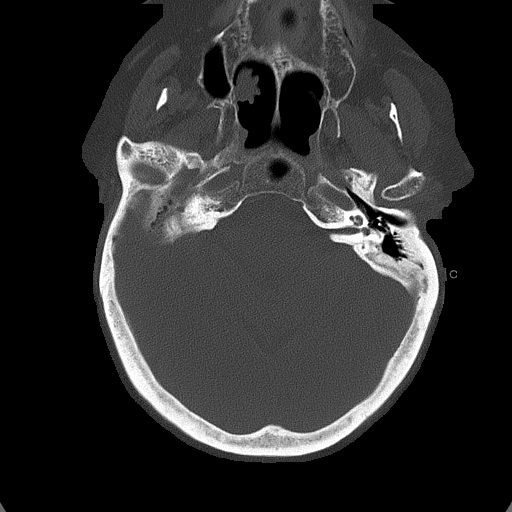
[im 41/91  bone]
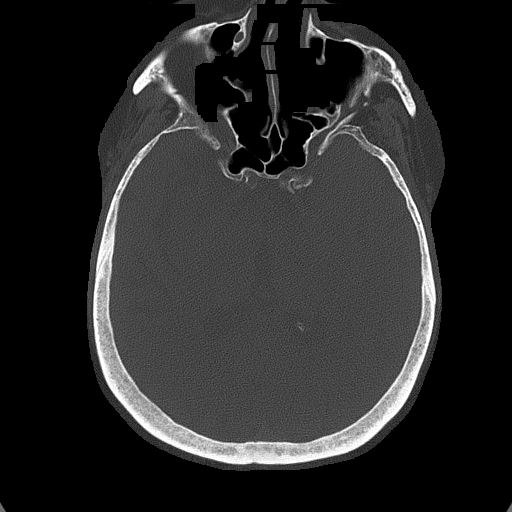
[im 50/91  bone]
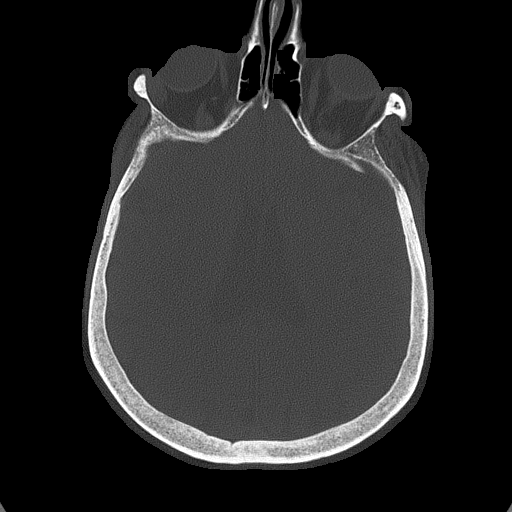
[im 64/91  bone]
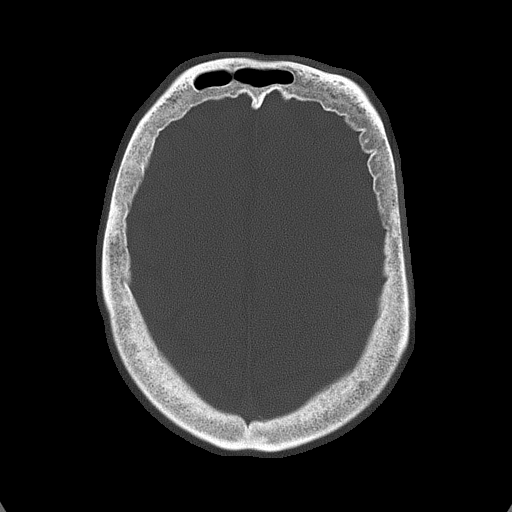
[im 73/91  bone]
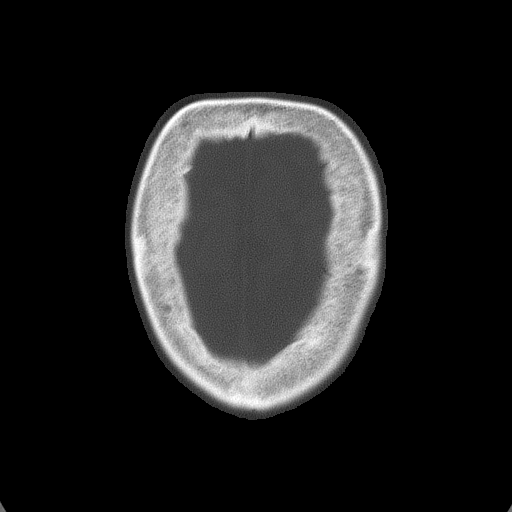

[Series 6: head without sag · sagittal · non-contrast · 0.35mm/px · 3 of 67 slices shown]
[im 23/67  brain]
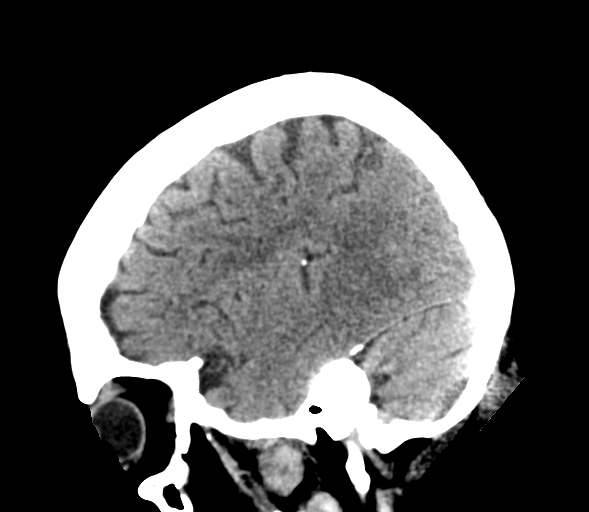
[im 34/67  brain]
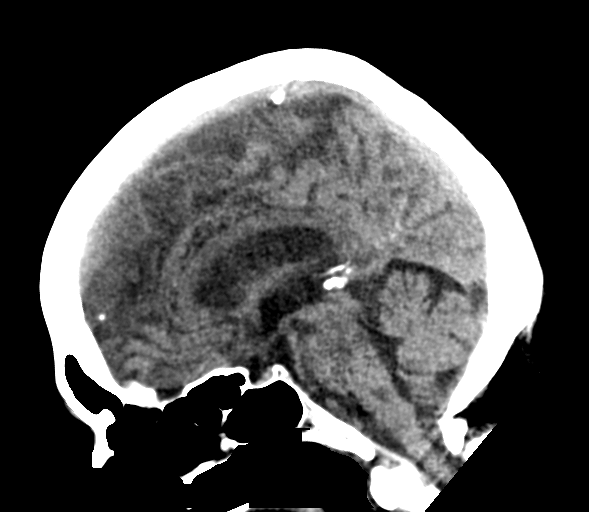
[im 45/67  brain]
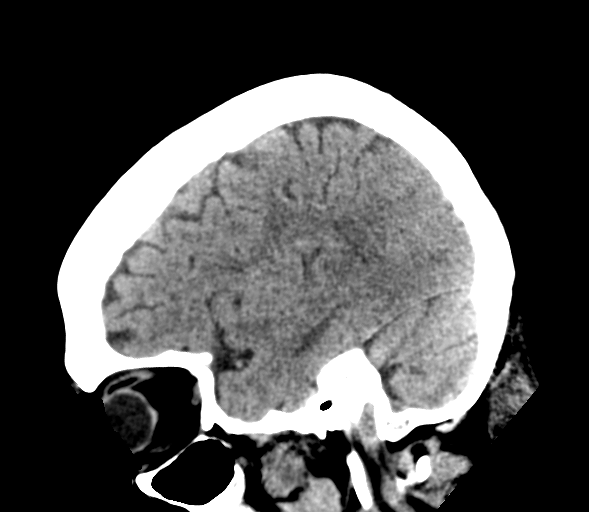

[17 of 37 positions shown; findings below may reference images not displayed]

FINDINGS: Brain: No evidence of acute infarction, hemorrhage, hydrocephalus,
extra-axial collection or mass lesion/mass effect.

Old left basal ganglia lacunar infarct versus dilated VR space.

Vascular: Intracranial atherosclerosis.

Skull: Normal. Negative for fracture or focal lesion.

Sinuses/Orbits: The visualized paranasal sinuses are essentially
clear. The mastoid air cells are unopacified.

Other: None.
IMPRESSION: No evidence of acute intracranial abnormality.

## 2022-05-01 ENCOUNTER — Other Ambulatory Visit: Payer: Self-pay | Admitting: Internal Medicine

## 2022-05-01 DIAGNOSIS — M47816 Spondylosis without myelopathy or radiculopathy, lumbar region: Secondary | ICD-10-CM

## 2022-05-03 ENCOUNTER — Other Ambulatory Visit: Payer: Self-pay | Admitting: Emergency Medicine

## 2022-05-03 IMAGING — CT CT ABD-PELV W/ CM
2 of 5 series · 14 of 46 positions shown, 16 images · IV contrast (APPLIED)
Comparison: Noncontrast CT 07/28/2014

CLINICAL DATA: Abdominal pain and nausea.

EXAM:
CT ABDOMEN AND PELVIS WITH CONTRAST
TECHNIQUE: Multidetector CT imaging of the abdomen and pelvis was performed
using the standard protocol following bolus administration of
intravenous contrast.
CONTRAST:  75mL OMNIPAQUE IOHEXOL 350 MG/ML SOLN

[Series 3: abdomen 5.0 · axial · 0.76mm/px · z∈[-359,+26]mm · 11 of 93 slices shown, 13 images]
[im 8/93  soft-tissue]
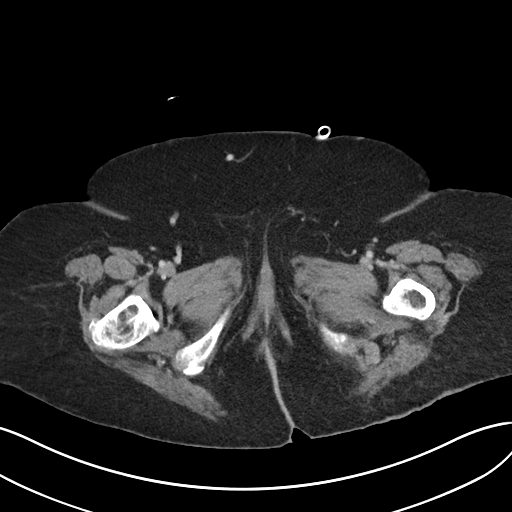
[im 8/93  bone]
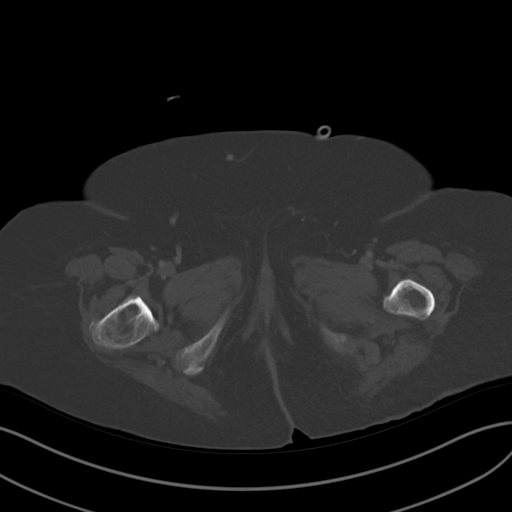
[im 16/93  soft-tissue]
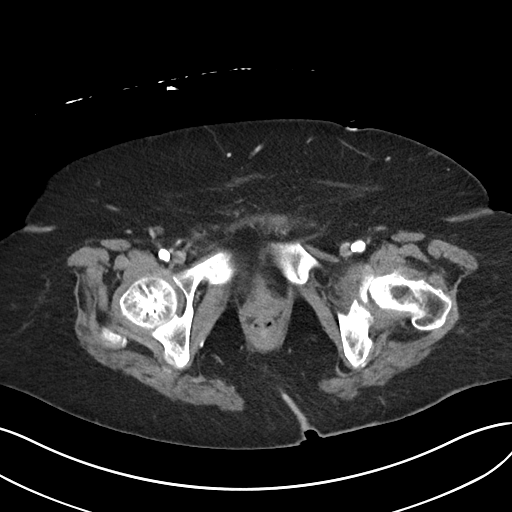
[im 24/93  soft-tissue]
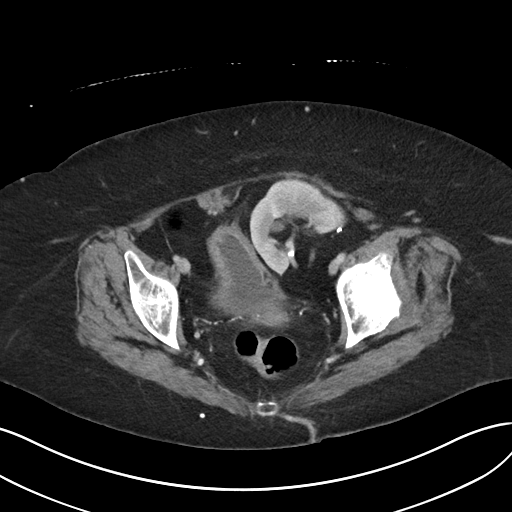
[im 31/93  soft-tissue]
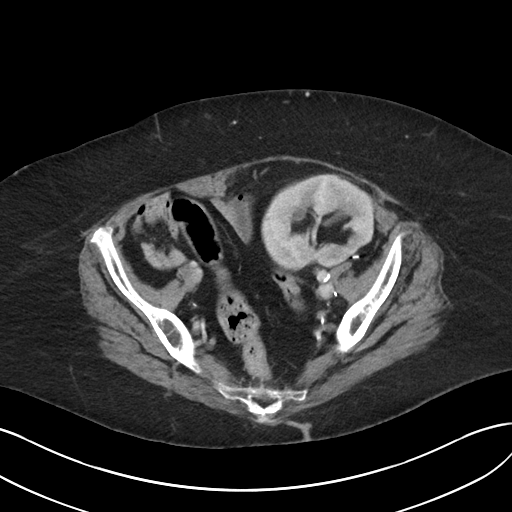
[im 39/93  soft-tissue]
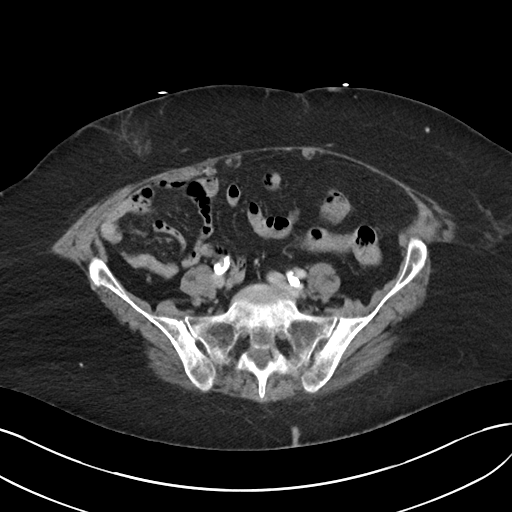
[im 47/93  soft-tissue]
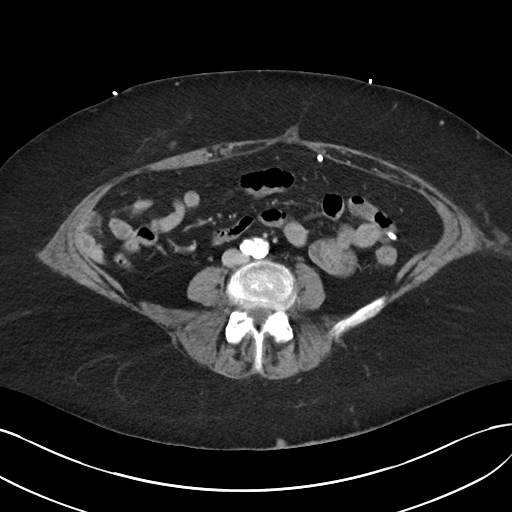
[im 54/93  soft-tissue]
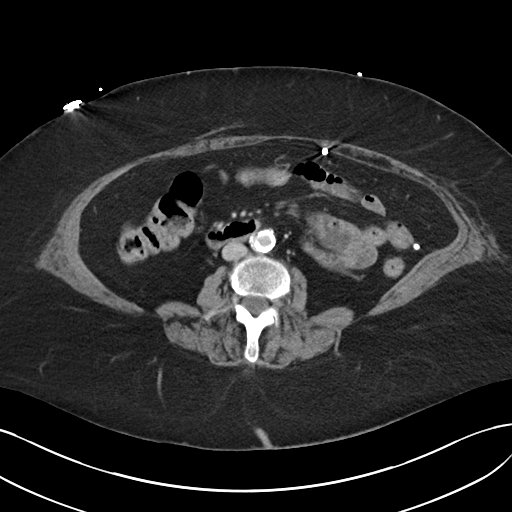
[im 62/93  soft-tissue]
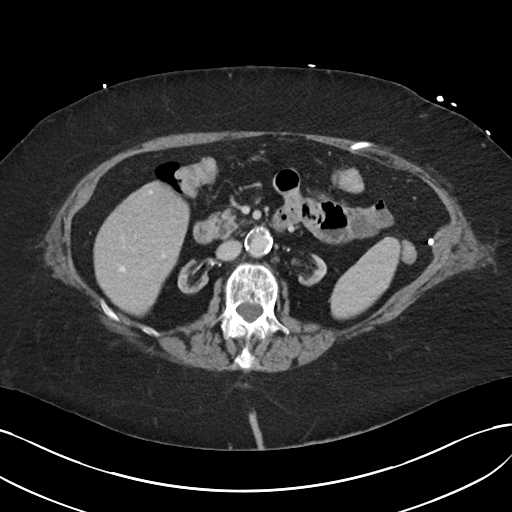
[im 70/93  soft-tissue]
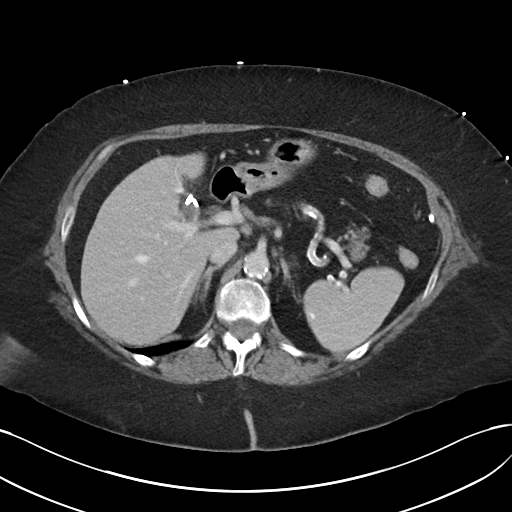
[im 70/93  bone]
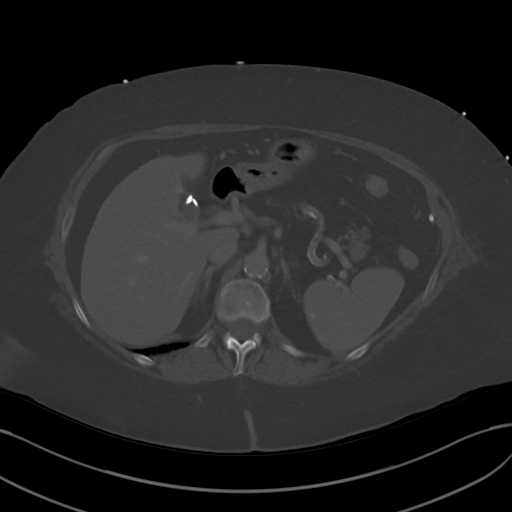
[im 77/93  soft-tissue]
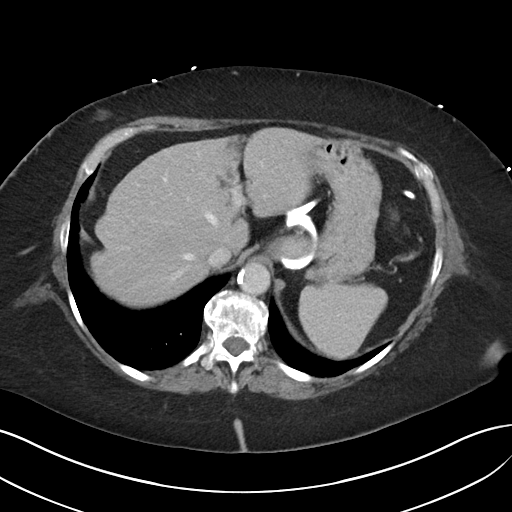
[im 85/93  soft-tissue]
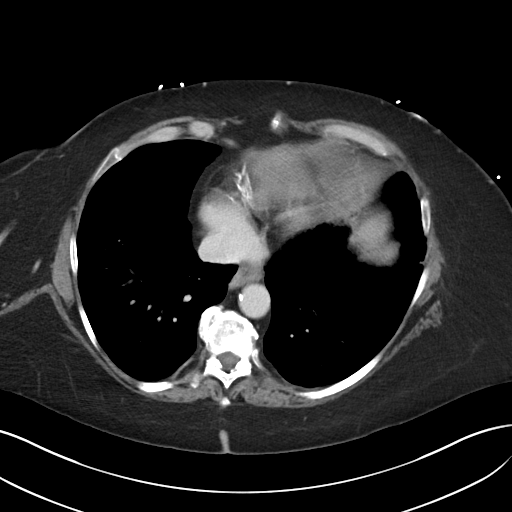

[Series 6: abdomen 3.0 mpr cor · coronal · 0.78mm/px · 3 of 100 slices shown]
[im 34/100  soft-tissue]
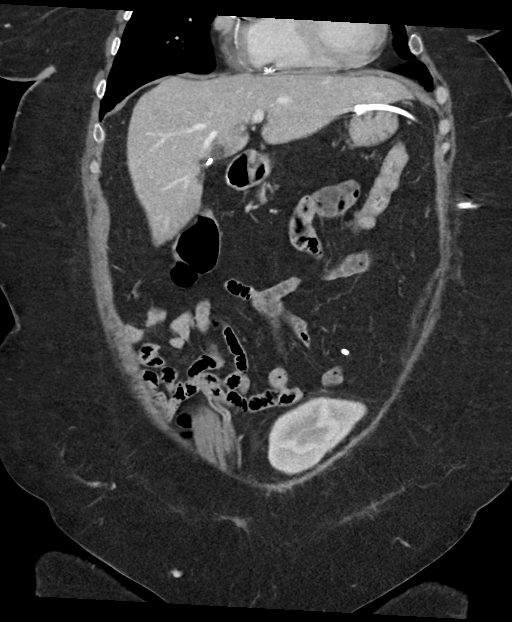
[im 45/100  soft-tissue]
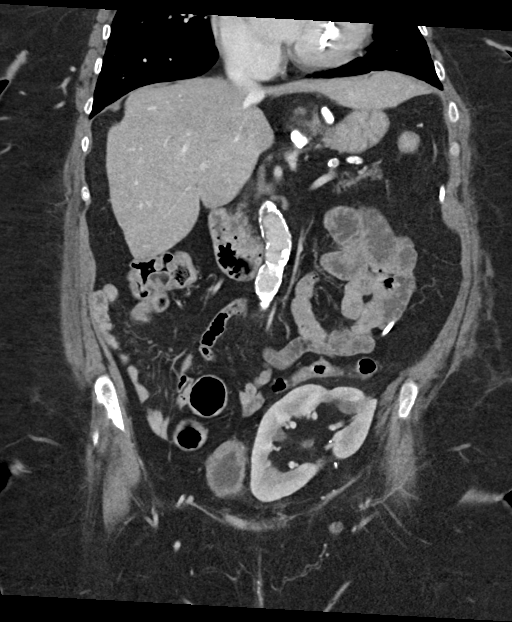
[im 56/100  soft-tissue]
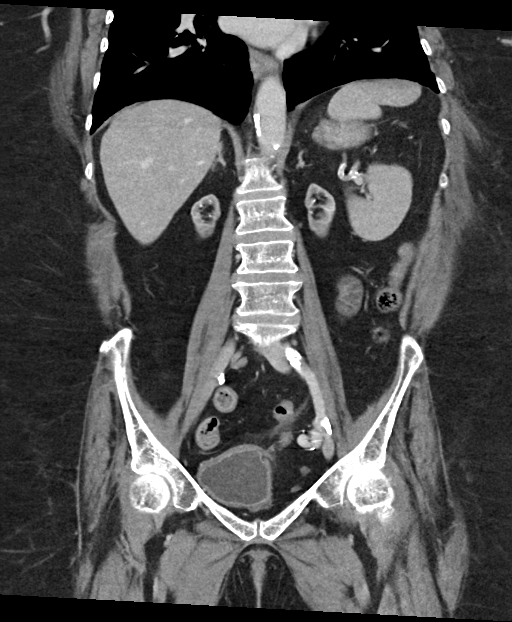

[14 of 46 positions shown; findings below may reference images not displayed]

FINDINGS: Lower chest: Heterogeneous pulmonary parenchyma with areas of
subpleural ground-glass opacity in the right lower lobe. Similar
findings seen on 08/23/2019 chest CT. Mild cardiomegaly with
coronary artery calcifications no pleural fluid.

Hepatobiliary: Calcified granuloma in the right lobe of the liver.
Diffusely decreased hepatic density typical of steatosis. 19 mm cyst
abutting the porta hepatis. Additional low-density abutting the
falciform ligament, possible focal fatty infiltration but
nonspecific. No suspicious liver lesion. Cholecystectomy without
biliary dilatation.

Pancreas: Fatty atrophy.  No ductal dilatation or inflammation.

Spleen: Normal in size.  Occasional calcified granuloma.

Adrenals/Urinary Tract: Normal adrenal glands. Marked bilateral
native renal parenchymal atrophy. Calcifications at both renal hila
may be nonobstructing stones or vascular. Left lower quadrant
transplant kidney. No transplant hydronephrosis or perinephric
edema. There are tiny cortical hypodensities in the transplant that
likely represent cysts but are too small to characterize.
Transplanted ureter is nondistended. Urinary bladder is partially
distended. There is bladder wall thickening about the dome. Similar
wall thickening was seen on 6578 exam.

Stomach/Bowel: Gastric band in place. Stomach is decompressed. No
small bowel obstruction or inflammation. Mild sigmoid colonic
diverticulosis without diverticulitis. Small volume of colonic
stool. Normal appendix.

Vascular/Lymphatic: Advanced aortic and branch atherosclerosis with
calcified and noncalcified atheromatous plaque. No aneurysm or acute
vascular finding. Chunky plaque at the iliac 1 bifurcations likely
cause some degree of luminal stenosis, not well assessed on this non
CTA exam. Patent portal vein. No enlarged lymph nodes in the abdomen
or pelvis.

Reproductive: Atrophic uterus.  No adnexal mass.

Other: No free air, free fluid, or intra-abdominal fluid collection.

Musculoskeletal: There are no acute or suspicious osseous
abnormalities. Degenerative change throughout the spine. Bilateral
hip osteoarthritis, moderately advanced on the left. Scattered bone
islands.
IMPRESSION: 1. No acute abnormality in the abdomen/pelvis.
2. Left lower quadrant transplant kidney without transplant
hydronephrosis or perinephric edema.
3. Mild bladder wall thickening about the dome, similar to 6578
exam, and favored to be chronic. Recommend urinalysis to exclude
urinary tract infection.
4. Hepatic steatosis.
5. Colonic diverticulosis without diverticulitis.
6. Heterogeneous pulmonary parenchyma in the lung bases suggestive
of small vessel or small airways disease. Areas of subpleural
ground-glass opacity in the right lower lobe are not significantly
changed from August 2019 chest CT.

Aortic Atherosclerosis (0UR64-8H3.3).

## 2022-05-03 IMAGING — CR DG CHEST 2V
2 series · 2 of 2 positions shown · non-contrast
Comparison: [facility] 04/12/2021.  CT 08/23/2019.

CLINICAL DATA: Left-sided chest pain for 1 week. Shortness of
breath. History of COPD. Patient fell 1 week ago.

EXAM:
CHEST - 2 VIEW

[chest lat]
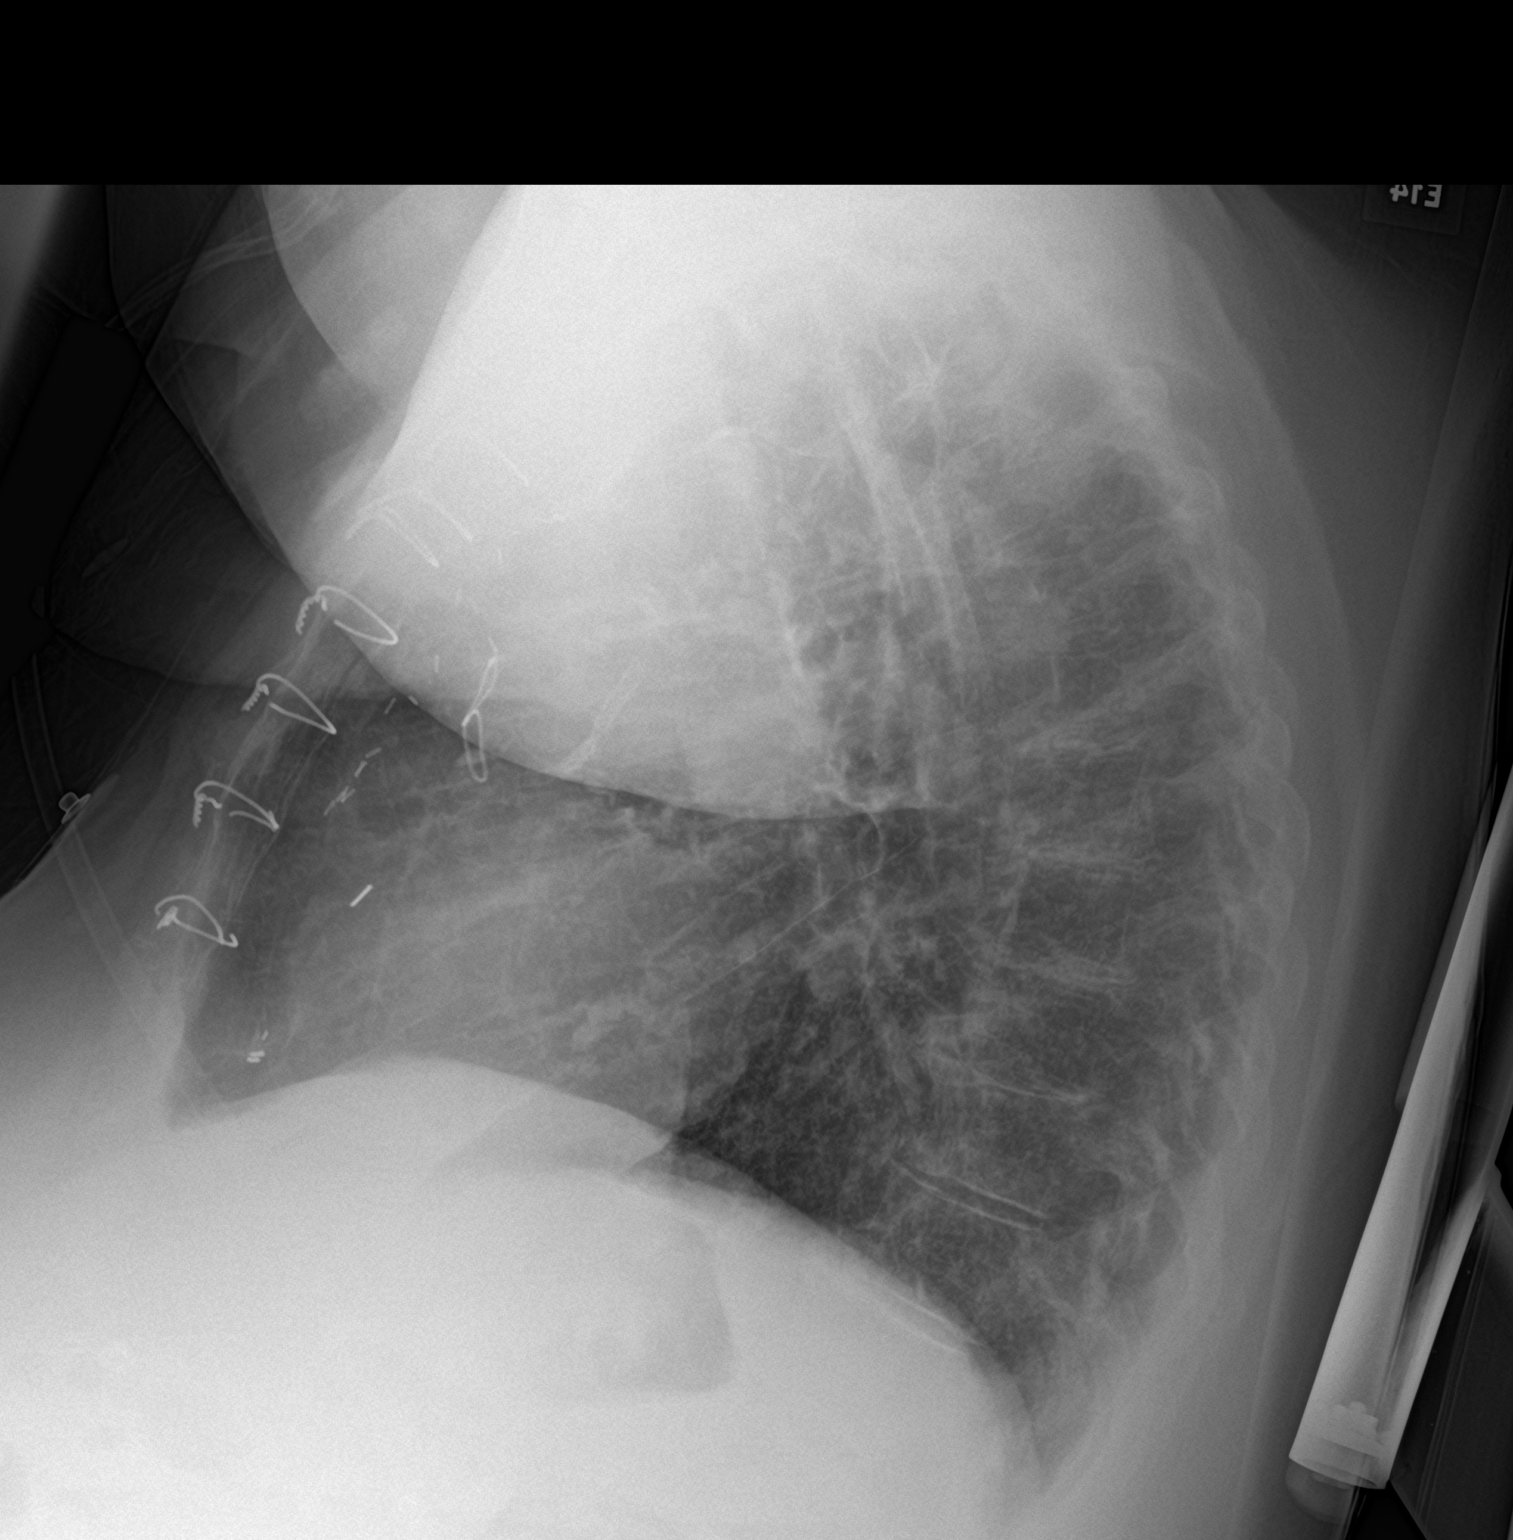

[chest ap]
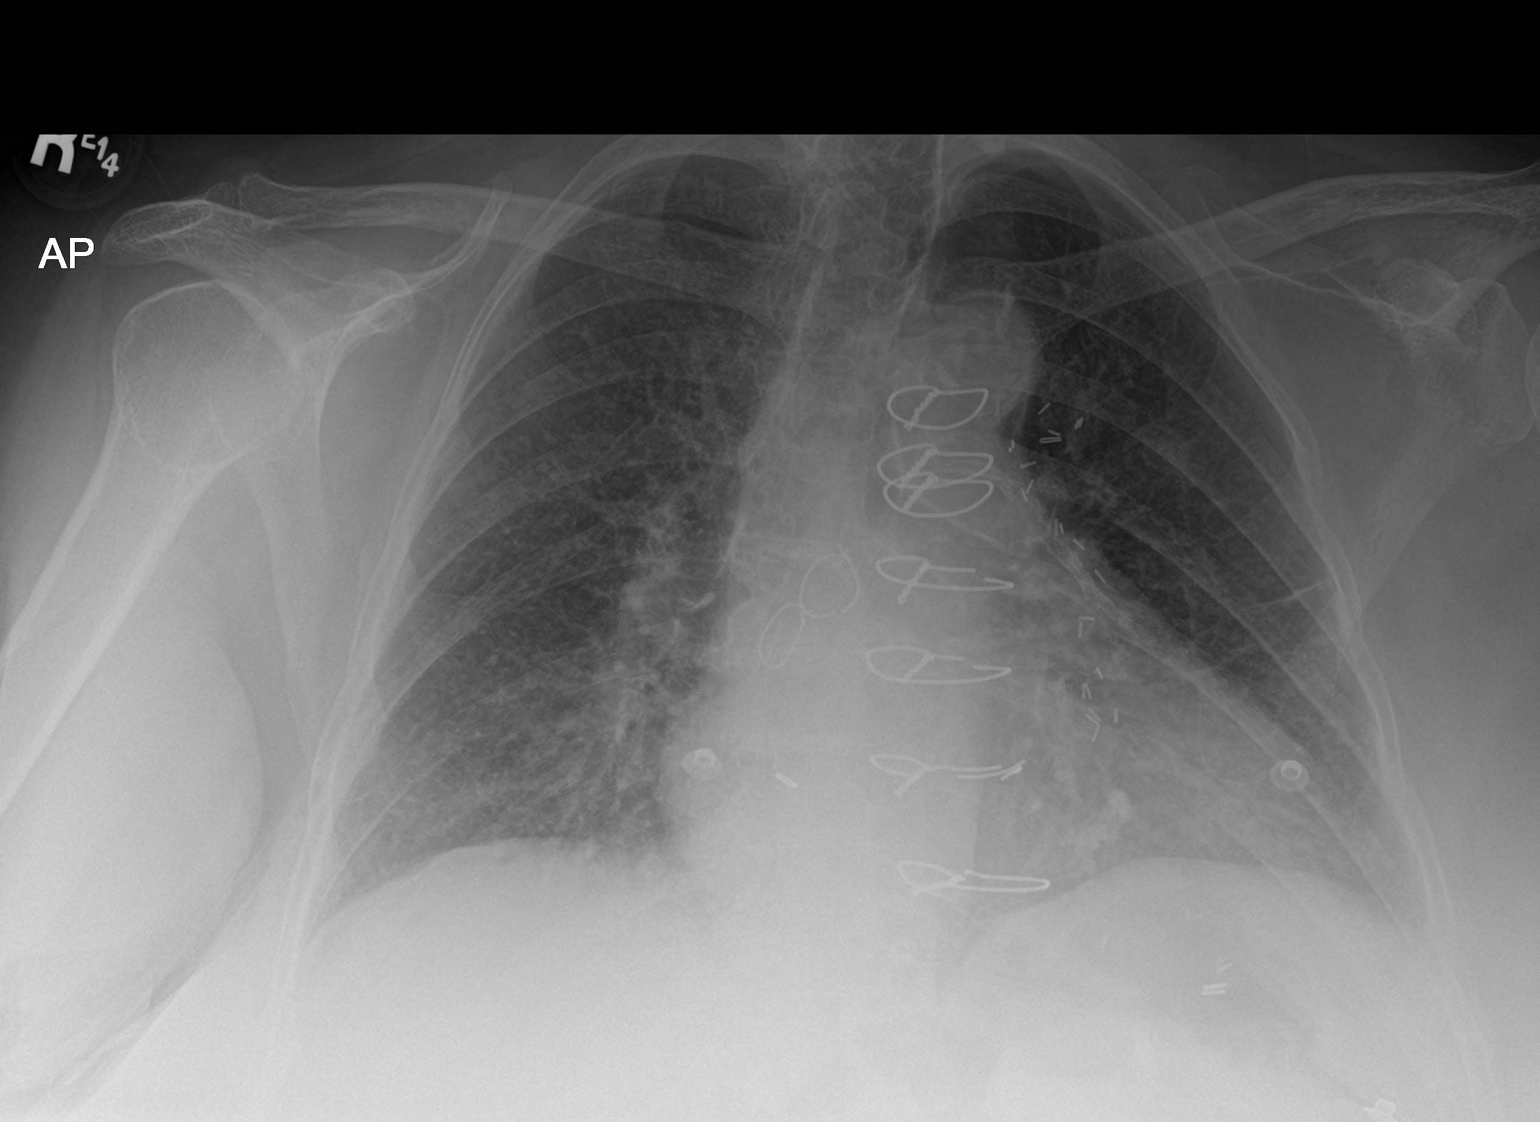

[2 of 2 positions shown; findings below may reference images not displayed]

FINDINGS: Stable mild cardiomegaly post median sternotomy and CABG. There are
mitral annular and aortic calcifications. The lungs appear clear.
There is no pleural effusion or pneumothorax. No acute fractures are
seen. Several sternotomy wires are chronically fractured.
IMPRESSION: Stable chest.  No evidence of acute cardiopulmonary process.

## 2022-05-04 ENCOUNTER — Other Ambulatory Visit: Payer: Self-pay | Admitting: Internal Medicine

## 2022-05-05 ENCOUNTER — Telehealth: Payer: Self-pay | Admitting: Licensed Clinical Social Worker

## 2022-05-05 NOTE — Patient Outreach (Signed)
  Care Coordination   05/05/2022 Name: Katherine [name] MRN: [id number] DOB: 04-Jul-1956   Care Coordination Outreach Attempts:  An unsuccessful telephone outreach was attempted today to offer the patient information about available care coordination services as a benefit of their health plan.   Follow Up Plan:  Additional outreach attempts will be made to offer the patient care coordination information and services.   Encounter Outcome:  No Answer  Care Coordination Interventions Activated:  No   Care Coordination Interventions:  No, not indicated    Casimer Lanius, [place] [phone]

## 2022-05-06 ENCOUNTER — Telehealth: Payer: Self-pay

## 2022-05-06 NOTE — Telephone Encounter (Signed)
Patient called in asking if we can send in the metoprolol tartrate (LOPRESSOR) 25 MG tablet as the pharmacy stated they do not have anything on file. Advised we sent it on the 18th but patient said she was on the phone with them prior to calling.

## 2022-05-07 ENCOUNTER — Other Ambulatory Visit: Payer: Self-pay | Admitting: Internal Medicine

## 2022-05-07 DIAGNOSIS — I152 Hypertension secondary to endocrine disorders: Secondary | ICD-10-CM

## 2022-05-07 DIAGNOSIS — I10 Essential (primary) hypertension: Secondary | ICD-10-CM

## 2022-05-07 DIAGNOSIS — I2510 Atherosclerotic heart disease of native coronary artery without angina pectoris: Secondary | ICD-10-CM

## 2022-05-07 MED ORDER — METOPROLOL TARTRATE 25 MG PO TABS: 25.0000 mg | ORAL_TABLET | Freq: Three times a day (TID) | ORAL | 0 refills | Status: DC

## 2022-05-11 ENCOUNTER — Telehealth: Payer: Self-pay

## 2022-05-11 NOTE — Patient Outreach (Signed)
  Care Coordination   Initial Visit Note   05/11/2022 Name: Katherine [name] MRN: [id number] DOB: [birth year]  Katherine [name] is a 66 y.o. year old female who sees Katherine Lima, MD for primary care. I spoke with  Katherine [name] by phone today.   Katherine [name] request to schedule telephone assessment at another date/time.  SDOH assessments and interventions completed:  No   Care Coordination Interventions Activated:  No  Care Coordination Interventions:  No, not indicated   Follow up plan: Follow up call scheduled for 05/12/22    Encounter Outcome:  Pt. Scheduled   Katherine Silversmith, RN, MSN, BSN, [place] Coordinator [phone]

## 2022-05-12 ENCOUNTER — Telehealth: Payer: Self-pay

## 2022-05-12 ENCOUNTER — Ambulatory Visit: Payer: Self-pay

## 2022-05-12 NOTE — Patient Outreach (Signed)
  Care Coordination   Initial Visit Note   05/12/2022 Name: Coleen Cardiff MRN: [id number] DOB: 05-08-1956  Codee Bloodworth is a 66 y.o. year old female who sees [name], MD for primary care. I spoke with  [name] by phone today.  What matters to the patients health and wellness today?  Find a doctor that takes Medicaid    Goals Addressed             This Visit's Progress    Community resource needs       Care Coordination Interventions: Care Guide referral for regarding Utility concerns and assistance with finding a specialty provider-Dermatologist Encouraged patient to call re: care coordination needs as needed. Contact number provided Discussed strategies for finding a local dermatologist         SDOH assessments and interventions completed:  Yes  SDOH Interventions Today    Flowsheet Row Most Recent Value  SDOH Interventions   Food Insecurity Interventions Intervention Not Indicated  Housing Interventions Intervention Not Indicated  Transportation Interventions Intervention Not Indicated  Utilities Interventions Intervention Not Indicated  [referral to care guide]        Care Coordination Interventions Activated:  Yes  Care Coordination Interventions:  Yes, provided   Follow up plan: Follow up call scheduled for 06/11/22    Encounter Outcome:  Pt. Visit Completed   [name], RN, MSN, BSN, [place] Coordinator [phone]

## 2022-05-12 NOTE — Patient Instructions (Signed)
Visit Information  Thank you for taking time to visit with me today. Please don't hesitate to contact me if I can be of assistance to you.   Following are the goals we discussed today:   Goals Addressed             This Visit's Progress    Community resource needs       Care Coordination Interventions: Care Guide referral for regarding Utility concerns and assistance with finding a specialty provider-Dermatologist Encouraged patient to call re: care coordination needs as needed. Contact number provided Discussed strategies for finding a local dermatologist         Our next appointment is by telephone on 06/11/22 at 2:30 pm  Please call the care guide team at [phone] if you need to cancel or reschedule your appointment.   If you are experiencing a Mental Health or [place] or need someone to talk to, please call the Suicide and Crisis Lifeline: 988  Patient verbalizes understanding of instructions and care plan provided today and agrees to view in [place]. Active MyChart status and patient understanding of how to access instructions and care plan via MyChart confirmed with patient.      [name], RN, MSN, BSN, [place] Coordinator [phone]

## 2022-05-12 NOTE — Telephone Encounter (Signed)
   Telephone encounter was:  Successful.  05/12/2022 Name: Katherine [name] MRN: [id number] DOB: 1955-12-30  Katherine [name] is a 66 y.o. year old female who is a primary care patient of [name], MD . The community resource team was consulted for assistance with Financial Difficulties related to Utility  and Medicaid Question  Care guide performed the following interventions: Patient provided with information about care guide support team and interviewed to confirm resource needs.  Follow Up Plan:  Care guide will outreach resources to assist patient with above needs.  [facility]  [place], [place]  Main Phone: [phone]  E-mail: [email or web address]  Website: [email or web address]

## 2022-05-14 ENCOUNTER — Other Ambulatory Visit: Payer: Self-pay | Admitting: Internal Medicine

## 2022-05-14 DIAGNOSIS — N182 Chronic kidney disease, stage 2 (mild): Secondary | ICD-10-CM

## 2022-05-16 ENCOUNTER — Other Ambulatory Visit: Payer: Self-pay | Admitting: Internal Medicine

## 2022-05-16 DIAGNOSIS — I2510 Atherosclerotic heart disease of native coronary artery without angina pectoris: Secondary | ICD-10-CM

## 2022-05-19 ENCOUNTER — Encounter: Payer: Self-pay | Admitting: Internal Medicine

## 2022-05-20 ENCOUNTER — Telehealth: Payer: Self-pay

## 2022-05-20 ENCOUNTER — Telehealth (INDEPENDENT_AMBULATORY_CARE_PROVIDER_SITE_OTHER): Payer: Medicare Other | Admitting: Family Medicine

## 2022-05-20 ENCOUNTER — Telehealth: Payer: Medicare Other | Admitting: Internal Medicine

## 2022-05-20 ENCOUNTER — Encounter: Payer: Self-pay | Admitting: Family Medicine

## 2022-05-20 DIAGNOSIS — F1190 Opioid use, unspecified, uncomplicated: Secondary | ICD-10-CM

## 2022-05-20 DIAGNOSIS — R112 Nausea with vomiting, unspecified: Secondary | ICD-10-CM

## 2022-05-20 MED ORDER — PROMETHAZINE HCL 25 MG PO TABS: 25.0000 mg | ORAL_TABLET | Freq: Four times a day (QID) | ORAL | 0 refills | Status: DC | PRN

## 2022-05-20 NOTE — Progress Notes (Signed)
[facility] [address] [place] [address] Dept: [phone] Dept Fax: [phone]  Virtual Video Visit  I connected with Katherine [name] on 05/20/22 at  4:00 PM EDT by a video enabled telemedicine application and verified that I am speaking with the correct person using two identifiers.  Location patient: Home Location provider: Clinic Persons participating in the virtual visit: Patient, Provider  I discussed the limitations of evaluation and management by telemedicine and the availability of in person appointments. The patient expressed understanding and agreed to proceed.  Chief Complaint  Patient presents with   Acute Visit    C/o having nausea, diarrhea, and fever (99) x 2-3 days.  Has taken Tylenol and promethazine.      SUBJECTIVE:  HPI: Katherine [name] is a 66 y.o. female who presents with a 2-3 day history of nausea and vomiting. She states this came on Sunday. She has felt generally weak and is finding it difficult to get up and out of her home. She states she has had "fever" of 28 F. She had some mild diarrhea today. She was scheduled to see her PCP this afternoon at 1:40 pm, but notes she was unable to leave her home related to how sick she felt.  Katherine [name] has a complicated medical history of extensive underlying cardiovascular disease, copd with respiratory failure, diabetes, chronic back pain, fibromyalgia, and is s/p kidney transplant. She also notes that she is almost out of her oxycodone that she takes for chronic. She notes the pharmacy told her that Dr. Ronnald Ramp would need to provide an order for this. She asks if this is something I could fill for her.  Patient Active Problem List   Diagnosis Date Noted   Right lower quadrant abdominal tenderness without rebound tenderness 02/02/2022   Spondylosis of lumbar region without myelopathy or radiculopathy 12/19/2021   Encounter for palliative care  involving management of pain 12/19/2021   COPD with acute exacerbation ([place]) 12/07/2021   CHF exacerbation ([place]) 10/18/2021   Atrial fibrillation with rapid ventricular response ([place]) 10/18/2021   Goals of care, counseling/discussion 10/03/2021   Class 2 obesity 09/25/2021   Rest pain of lower extremity due to atherosclerosis ([place]) 09/02/2021   Longstanding persistent atrial fibrillation (HCC)    Chronic diastolic CHF (congestive heart failure) ([place]) 08/06/2021   Obesity (BMI 30-39.9) 08/05/2021   Polymorphic ventricular tachycardia    Bradycardia    COPD (chronic obstructive pulmonary disease) ([place])    History of renal transplant    Coronary stent restenosis due to scar tissue    Chronic respiratory failure with hypoxia ([place]) 01/01/2021   Candidal skin infection 08/13/2020   Chronic bronchitis, mucopurulent ([place]) 08/24/2019   Current chronic use of systemic steroids 07/23/2019   OSA (obstructive sleep apnea)    Severe episode of recurrent major depressive disorder, with psychotic features ([place]) 02/16/2019   Obstructive chronic bronchitis without exacerbation 11/03/2018   Long term (current) use of antithrombotics/antiplatelets 04/28/2018   Squamous cell carcinoma of skin of right thumb 02/02/2018   LBBB (left bundle branch block) 10/07/2017   Primary osteoarthritis of right knee 08/18/2017   Ovarian cyst 05/18/2017   Mild aortic stenosis by prior echocardiogram 01/28/2017   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   Normocytic anemia 10/13/2016   Fibromyalgia 03/30/2016   Other spondylosis with radiculopathy, lumbar region 03/30/2016   Type 2 diabetes mellitus with hyperlipidemia ([place]) 03/30/2016   Paroxysmal atrial fibrillation ([place]); CHA2DS2VASc score F, HTN, CAD, CVA =  5 06/28/2014   Debility 06/28/2014   CAD S/P percutaneous coronary angioplasty - PCI x 5 to SVG-D1 09/11/2013    Class: Diagnosis of   Essential hypertension    PAD (peripheral artery disease) ([place]) 08/17/2013   CAD  (coronary artery disease) of bypass graft 10/08/2012   Stenosis of right carotid artery without infarction 10/08/2012   Dyslipidemia, goal LDL below 70 10/08/2012    Class: Diagnosis of   Mitral annular calcification 10/08/2012   Hypomagnesemia 06/13/2012   Hypokalemia and hypomagnesia  06/12/2012   Renal transplant disorder 06/12/2012   Chronic allergic rhinitis 04/29/2011   Extrinsic asthma 09/09/2007   GERD 09/09/2007   Morbid obesity - s/p Lap Band 9/'05 05/07/2004    Class: Diagnosis of   ESRD (end stage renal disease) ([place]) 1991   Past Surgical History:  Procedure Laterality Date   ABDOMINAL AORTOGRAM N/A 04/21/2018   Procedure: ABDOMINAL AORTOGRAM;  Surgeon: [name], MD;  Location: [facility];  Service: Cardiovascular;  Laterality: N/A;   CATHETER REMOVAL     CHOLECYSTECTOMY N/A 10/29/2014   Procedure: LAPAROSCOPIC CHOLECYSTECTOMY WITH INTRAOPERATIVE CHOLANGIOGRAM;  Surgeon: Excell Seltzer, MD;  Location: [facility];  Service: General;  Laterality: N/A;   CORONARY ANGIOPLASTY  1994   x5   CORONARY ARTERY BYPASS GRAFT  1995   LIMA-LAD, SVG-RPDA, SVG-D1   CORONARY STENT INTERVENTION N/A 02/20/2021   Procedure: PERCUTANEOUS CORONARY STENT INTERVENTION;  Surgeon: [name], MD;  Location: [facility]; ostLCx 60% (Neg RFR 0.96);; SVG- D2 recurrent 90% ISR & 95% native D2 after graft-> DES PCI of 95% anastomotic D2 lesion (Onyx Frontier 2.25 mm x 12 mm => 2.75 mm @ overlap, 2.5 distal.);PTCA of ISR in body of graft. ->  2.5 mm scoring balloon & post-dil w/ 2.75 mm [place] balloon   ESOPHAGOGASTRODUODENOSCOPY N/A 10/15/2016   Procedure: ESOPHAGOGASTRODUODENOSCOPY (EGD);  Surgeon: Wilford Corner, MD;  Location: [facility];  Service: Endoscopy;  Laterality: N/A;   I & D EXTREMITY Right 01/29/2018   Procedure: IRRIGATION AND DEBRIDEMENT THUMB;  Surgeon: Dayna Barker, MD;  Location: [facility];  Service: Plastics;  Laterality: Right;   INCISE AND DRAIN ABCESS      INTRAVASCULAR PRESSURE WIRE/FFR STUDY N/A 02/20/2021   Procedure: INTRAVASCULAR PRESSURE WIRE/FFR STUDY;  Surgeon: [name], MD;  Location: [facility];  Service: Cardiovascular;  Laterality: N/A;   KIDNEY TRANSPLANT  1991   KNEE ARTHROSCOPY WITH LATERAL MENISECTOMY Left 12/03/2017   Procedure: LEFT KNEE ARTHROSCOPY WITH LATERAL MENISECTOMY;  Surgeon: Earlie Server, MD;  Location: [facility];  Service: Orthopedics;  Laterality: Left;   LAPAROSCOPIC GASTRIC BANDING  04/2004; 10/'09, 2/'10   Port Replacement x 2   LEFT HEART CATH AND CORONARY ANGIOGRAPHY N/A 02/20/2021   Procedure: LEFT HEART CATH AND CORONARY ANGIOGRAPHY;  Surgeon: [name], MD;  Location: [facility];  Service: Cardiovascular;  Laterality: N/A;   LEFT HEART CATH AND CORS/GRAFTS ANGIOGRAPHY N/A 04/21/2018   Procedure: LEFT HEART CATH AND CORS/GRAFTS ANGIOGRAPHY;  Surgeon: [name], MD;  Location: [facility];  Ost-Prox LAD 50% - proxLAD (pre & post D1) 100% CTO. Cx - patent, small OM1 (stable ~ ostial OM1 90%, too small for PCI) & 2 small LPL; Ost-distal RCA 100% CTO.  LIMA-LAD (not injected); SVG-dRCA patent, SVG-D1 - insertion stent ~20% ISR - Severe R CFA disease w/ focal Sub TO   LEFT HEART CATH AND CORS/GRAFTS ANGIOGRAPHY  5/'01, 3/'02, 8/'03, 10/'04;  1/'15   08/22/2013: LAD & RCA 100%; LIMA-LAD & SVG-rPDA patent; Cx-- OM1 60%, OM2 ostial ~50%; SVG-D1 - 80% mid, 50% distal ISR --PCI   LEFT HEART CATH AND CORS/GRAFTS ANGIOGRAPHY N/A 01/31/2021   Procedure: LEFT HEART CATH AND CORS/GRAFTS ANGIOGRAPHY;  Surgeon: Jolaine Artist, MD;  Location: [facility];;   LEFT HEART CATH AND CORS/GRAFTS ANGIOGRAPHY N/A 09/05/2021   Procedure: LEFT HEART CATH AND CORS/GRAFTS ANGIOGRAPHY;  Surgeon: [name], MD;  Location: [facility];  Service: Cardiovascular;  Laterality: N/A;   LEFT HEART CATH AND CORS/GRAFTS ANGIOGRAPHY N/A 08/21/2021   Procedure: LEFT HEART CATH AND  CORS/GRAFTS ANGIOGRAPHY;  Surgeon: Belva Crome, MD;  Location: [facility];  Service: Cardiovascular;  Laterality: N/A;   LEFT HEART CATHETERIZATION WITH CORONARY/GRAFT ANGIOGRAM N/A 08/23/2013   Procedure: LEFT HEART CATHETERIZATION WITH Beatrix Fetters;  Surgeon: Wellington Hampshire, MD;  Location: [facility];  Service: Cardiovascular;  Laterality: N/A;   Lower Extremity Arterial Dopplers  08/2013   ABI: R 0.96, L 1.04   MULTIPLE TOOTH EXTRACTIONS  age 44   NM MYOVIEW LTD  03/2016   EF 62%. LOW RISK. C/W prior MI - no Ischemia. Apical hypokinesis.   PERCUTANEOUS CORONARY STENT INTERVENTION (PCI-S)  5/'01, 3/'02, 8/'03, 10/'04;   '01 - S660 BMS 2.5 x 9 - dSVG-D1 into D1; '02- post-stent stenosis - 2.5 x 8 Pixel BMS; '8\03: ISR/Thrombosis into native D1 - AngioJet, 2.5 x 13 Pixel; '04 - ISR 95% - covered stented area with Taxus DES 2.5 mm x 20 (2.88)   PERCUTANEOUS CORONARY STENT INTERVENTION (PCI-S)  08/23/2013   Procedure: PERCUTANEOUS CORONARY STENT INTERVENTION (PCI-S);  Surgeon: Wellington Hampshire, MD;  Location: [facility];  Service: Cardiovascular;;mid SVG-D1 80%; distal stent ~50% ISR; Promus Prermier DES 2.75 mm xc 20 mm (2.8 mm)   PORT-A-CATH REMOVAL     kidney   TRANSTHORACIC ECHOCARDIOGRAM  07/2019   a) 07/2019: EF 55 to 60%.  No LVH.  Paradoxical septal WM-s/p CABG.  GRII DD.  Nl RV size and fxn.  Mild bilateral atrial dilation.  Mod MAC.  Trace MR.  Mild AS (gradients: Mean 14.3 mmHg -peak 24.9 mmHg).; B) 06/2020: EF 40 to 45%.  Moderate concentric LVH.  GRII DD.  Elevated LAP.  Mod HK mid Apical Ant-AntSept wall & mild Apical Dyskinesis.  Mod LA dilation.  Mild MR.  AoV sclerosis w/o AS.   TRANSTHORACIC ECHOCARDIOGRAM  01/30/2021   EF 55 to 60%.  Mild LVH.  GR 1 DD.  Elevated LAP.  Moderate LA dilation.  Mild MR with mild MS.  Mild aortic valve stenosis.:   TUBAL LIGATION     wrist fistula repair Left    dialysis for one year   Family History  Problem Relation  Age of Onset   Cancer Mother        liver   Heart disease Father    Cancer Father        colon   Arrhythmia Brother        Atrial Fibrillation   Arrhythmia Paternal Aunt        Atrial Fibrillation   Social History   Tobacco Use   Smoking status: Former    Packs/day: 1.00    Years: 30.00    Total pack years: 30.00    Types: Cigarettes    Quit date: 08/17/2002    Years since quitting: 19.7   Smokeless tobacco: Never  Vaping Use  Vaping Use: Never used  Substance Use Topics   Alcohol use: No   Drug use: No    Current Outpatient Medications:    apixaban (ELIQUIS) 5 MG TABS tablet, Take 1 tablet (5 mg total) by mouth 2 (two) times daily., Disp: 60 tablet, Rfl: 2   arformoterol (BROVANA) 15 MCG/2ML NEBU, Take 2 mLs (15 mcg total) by nebulization 2 (two) times daily., Disp: 120 mL, Rfl: 12   azaTHIOprine (IMURAN) 50 MG tablet, Take 2 & 1/2 tablets (125 mg) by mouth daily at 3pm (Patient taking differently: Take 125 mg by mouth daily.), Disp: 75 tablet, Rfl: 2   bisacodyl (DULCOLAX) 5 MG EC tablet, Take 1 tablet (5 mg total) by mouth daily as needed for moderate constipation., Disp: 30 tablet, Rfl: 0   budesonide (PULMICORT) 0.5 MG/2ML nebulizer solution, Take 2 mLs (0.5 mg total) by nebulization 2 (two) times daily. (Patient taking differently: Take 2 mLs by nebulization 2 (two) times daily as needed (wheezing).), Disp: 360 mL, Rfl: 1   calcitRIOL (ROCALTROL) 0.25 MCG capsule, Take 0.25 mcg by mouth See admin instructions. Takes every 3 days., Disp: , Rfl:    clopidogrel (PLAVIX) 75 MG tablet, Take 75 mg by mouth daily., Disp: , Rfl:    CVS VITAMIN C 500 MG tablet, TAKE 1 TABLET (500 MG TOTAL) BY MOUTH DAILY., Disp: 30 tablet, Rfl: 1   cyclobenzaprine (FLEXERIL) 10 MG tablet, TAKE 1 TABLET BY MOUTH TWICE A DAY AS NEEDED FOR MUSCLE SPASM, Disp: 180 tablet, Rfl: 0   Ensure Max Protein (ENSURE MAX PROTEIN) LIQD, Take 330 mLs (11 oz total) by mouth 2 (two) times daily., Disp: , Rfl:     fluticasone (FLONASE) 50 MCG/ACT nasal spray, PLACE 2 SPRAYS INTO BOTH NOSTRILS 2 TIMES DAILY., Disp: 48 mL, Rfl: 0   furosemide (LASIX) 40 MG tablet, TAKE 1 TABLET BY MOUTH EVERY DAY, Disp: 90 tablet, Rfl: 0   gabapentin (NEURONTIN) 100 MG capsule, Take 1 capsule (100 mg total) by mouth at bedtime., Disp: 30 capsule, Rfl: 1   glimepiride (AMARYL) 4 MG tablet, Take 4 mg by mouth daily with breakfast., Disp: , Rfl:    insulin aspart (NOVOLOG FLEXPEN) 100 UNIT/ML FlexPen, Inject 4-12 Units into the skin 3 (three) times daily with meals. Sliding scale., Disp: , Rfl:    Insulin Glargine (BASAGLAR KWIKPEN) 100 UNIT/ML, Inject 20 Units into the skin daily., Disp: , Rfl:    Insulin Pen Needle (PEN NEEDLES) 32G X 4 MM MISC, Use as needed to administer insulin. E11.9, Disp: 100 each, Rfl: 1   isosorbide mononitrate (IMDUR) 60 MG 24 hr tablet, Take 1 tablet (60 mg total) by mouth daily., Disp: 90 tablet, Rfl: 1   lamoTRIgine (LAMICTAL) 25 MG tablet, TAKE 1 TABLET BY MOUTH EVERYDAY AT BEDTIME, Disp: 93 tablet, Rfl: 1   LORazepam (ATIVAN) 2 MG/ML concentrated solution, Place 0.5 mLs (1 mg total) under the tongue every 8 (eight) hours as needed for anxiety, seizure, sedation or sleep (distress)., Disp: 270 mL, Rfl: 1   metFORMIN (GLUCOPHAGE) 500 MG tablet, Take 500 mg by mouth 2 (two) times daily., Disp: , Rfl:    metoprolol tartrate (LOPRESSOR) 25 MG tablet, Take 1 tablet (25 mg total) by mouth 3 (three) times daily., Disp: 270 tablet, Rfl: 0   montelukast (SINGULAIR) 10 MG tablet, TAKE 1 TABLET BY MOUTH EVERYDAY AT BEDTIME, Disp: 90 tablet, Rfl: 3   Multiple Vitamin (MULTIVITAMIN WITH MINERALS) TABS tablet, Take 1 tablet by mouth daily., Disp: ,  Rfl:    nitroGLYCERIN (NITROSTAT) 0.4 MG SL tablet, Place 0.4 mg under the tongue every 5 (five) minutes as needed for chest pain., Disp: , Rfl:    OLANZapine (ZYPREXA) 5 MG tablet, TAKE 1 TABLET BY MOUTH EVERY DAY AT 6PM, Disp: 30 tablet, Rfl: 3   Oxycodone HCl 10 MG  TABS, Take 1 tablet (10 mg total) by mouth every 8 (eight) hours as needed., Disp: 90 tablet, Rfl: 0   OXYGEN, Inhale 4 L into the lungs continuous., Disp: , Rfl:    pantoprazole (PROTONIX) 40 MG tablet, Take 1 tablet (40 mg total) by mouth daily., Disp: 90 tablet, Rfl: 1   predniSONE (DELTASONE) 5 MG tablet, Take 5 mg by mouth daily with breakfast., Disp: , Rfl:    ranolazine (RANEXA) 1000 MG SR tablet, TAKE 1 TABLET BY MOUTH TWICE A DAY, Disp: 180 tablet, Rfl: 0   rosuvastatin (CRESTOR) 20 MG tablet, TAKE 1 TABLET BY MOUTH EVERY DAY, Disp: 90 tablet, Rfl: 1   sertraline (ZOLOFT) 100 MG tablet, Take 1 tablet (100 mg total) by mouth daily. (Patient taking differently: Take 100 mg by mouth at bedtime.), Disp: 90 tablet, Rfl: 2   VENTOLIN HFA 108 (90 Base) MCG/ACT inhaler, TAKE 2 PUFFS BY MOUTH EVERY 6 HOURS AS NEEDED FOR WHEEZE OR SHORTNESS OF BREATH, Disp: 18 each, Rfl: 0   promethazine (PHENERGAN) 25 MG tablet, Take 1 tablet (25 mg total) by mouth every 6 (six) hours as needed for nausea or vomiting., Disp: 30 tablet, Rfl: 0  Allergies  Allergen Reactions   Tetracycline Hives    Patient tolerated Doxycycline Dec 2020   Niacin Other (See Comments)    Mouth blisters   Niaspan [Niacin Er] Other (See Comments)    Mouth blisters   Sulfa Antibiotics Nausea Only and Other (See Comments)    "Tears up stomach"   Sulfonamide Derivatives Other (See Comments)    Reaction: per patient "tears her stomach up"   Codeine Nausea And Vomiting   Erythromycin Nausea And Vomiting   Hydromorphone Hcl Nausea And Vomiting   Morphine And Related Nausea And Vomiting   Nalbuphine Nausea And Vomiting    Nubain   Sulfasalazine Nausea Only and Other (See Comments)    per patient "tears her stomach up", "Tears up stomach"   Tape Rash and Other (See Comments)    No "plastic" tape," please----cloth tape only   ROS: See pertinent positives and negatives per HPI.  OBSERVATIONS/OBJECTIVE:  VITALS per patient if  applicable: There were no vitals filed for this visit.   GENERAL: Alert and oriented. Appears generally debilitated, but in no acute distress.  HEENT: Atraumatic. Eyes clear. No obvious abnormalities on inspection of external nose and ears.  NECK: Normal movements of the head and neck.  LUNGS: On inspection, no signs of respiratory distress. Breathing rate appears normal. No obvious gross SOB, gasping or wheezing, and no conversational dyspnea.  CV: No obvious cyanosis.  PSYCH/NEURO: Pleasant and cooperative. Mild tearfulness as she talks about how poorly she feels.  ASSESSMENT AND PLAN:  1. Nausea and vomiting, unspecified vomiting type This patient is medically fragile and complex. The video format is not appropriate for assessment and management of her issues, esp. not with a physician that she does not have an established relationship with. At most, she does not appear to be in current distress. I offered to refill her promethazine. She should take small sips of fluids frequently, until she is better hydrated. She should eat as  her appetite improves. If she is not improving or her symptoms worsen, she should either try to see her PCP or consider if she needs to go to the ED.  - promethazine (PHENERGAN) 25 MG tablet; Take 1 tablet (25 mg total) by mouth every 6 (six) hours as needed for nausea or vomiting.  Dispense: 30 tablet; Refill: 0  2. Chronic, continuous use of opioids I explained to Ms. Konieczny that it would be inappropriate for me to prescribe her chronic opioids. I recommended she contact Dr. Ronnald Ramp' office in regards to this request.   I discussed the assessment and treatment plan with the patient. The patient was provided an opportunity to ask questions and all were answered. The patient agreed with the plan and demonstrated an understanding of the instructions.   The patient was advised to call back or seek an in-person evaluation if the symptoms worsen or if the condition  fails to improve as anticipated.  Return if symptoms worsen or fail to improve with PCP or ED.   Haydee Salter, MD

## 2022-05-20 NOTE — Telephone Encounter (Signed)
   Telephone encounter was:  Successful.  05/20/2022 Name: Katherine [name] MRN: [id number] DOB: 03-23-1956  Katherine [name] is a 66 y.o. year old female who is a primary care patient of [name], MD . The community resource team was consulted for assistance with Financial Difficulties related to utilities and dermatology  Care guide performed the following interventions: Follow up call placed to the patient to discuss status of referral Patient has been advised Financial resources will be sent in the mail. CG educated patient on - if she has not received the information in mail within 7 to 14 days or if she has any additional questions to please call me back at [phone]. Patient understood.  CG also spoke to PCP office as patient is looking for dermatologist within [facility]. PCP office added information for patient's appt today. Pt has been advised and should be able to receive referral from PCP.  Follow Up Plan:  No further follow up planned at this time. The patient has been provided with needed resources.  [facility] management  [place]  Main Phone: [phone]  E-mail: [email or web address]  Website: [email or web address]

## 2022-05-20 NOTE — Telephone Encounter (Signed)
MEDICATION: Oxycodone HCl 10 MG TABS / promethazine (PHENERGAN) 25 MG tablet  PHARMACY: [id number] [place] [place] - [address]  Comments: Patient has an appointment today but does not believe she will be able to make it due to how bad she is feeling but wants to know if Dr.Jones will send in the medications if she cancels todays appointment.   **Let patient know to contact pharmacy at the end of the day to make sure medication is ready. **  ** Please notify patient to allow 48-72 hours to process**  **Encourage patient to contact the pharmacy for refills or they can [facility]

## 2022-05-21 ENCOUNTER — Other Ambulatory Visit: Payer: Self-pay | Admitting: Internal Medicine

## 2022-05-21 DIAGNOSIS — M1711 Unilateral primary osteoarthritis, right knee: Secondary | ICD-10-CM

## 2022-05-21 DIAGNOSIS — M47816 Spondylosis without myelopathy or radiculopathy, lumbar region: Secondary | ICD-10-CM

## 2022-05-21 DIAGNOSIS — R112 Nausea with vomiting, unspecified: Secondary | ICD-10-CM

## 2022-05-21 DIAGNOSIS — Z515 Encounter for palliative care: Secondary | ICD-10-CM

## 2022-05-21 DIAGNOSIS — M4726 Other spondylosis with radiculopathy, lumbar region: Secondary | ICD-10-CM

## 2022-05-21 MED ORDER — OXYCODONE HCL 10 MG PO TABS: 10.0000 mg | ORAL_TABLET | Freq: Three times a day (TID) | ORAL | 0 refills | Status: DC | PRN

## 2022-05-26 IMAGING — DX DG CHEST 1V PORT
1 series · 1 of 1 positions shown · non-contrast
Comparison: 04/19/2021

CLINICAL DATA: Chest pain

EXAM:
PORTABLE CHEST 1 VIEW

[chest ap]
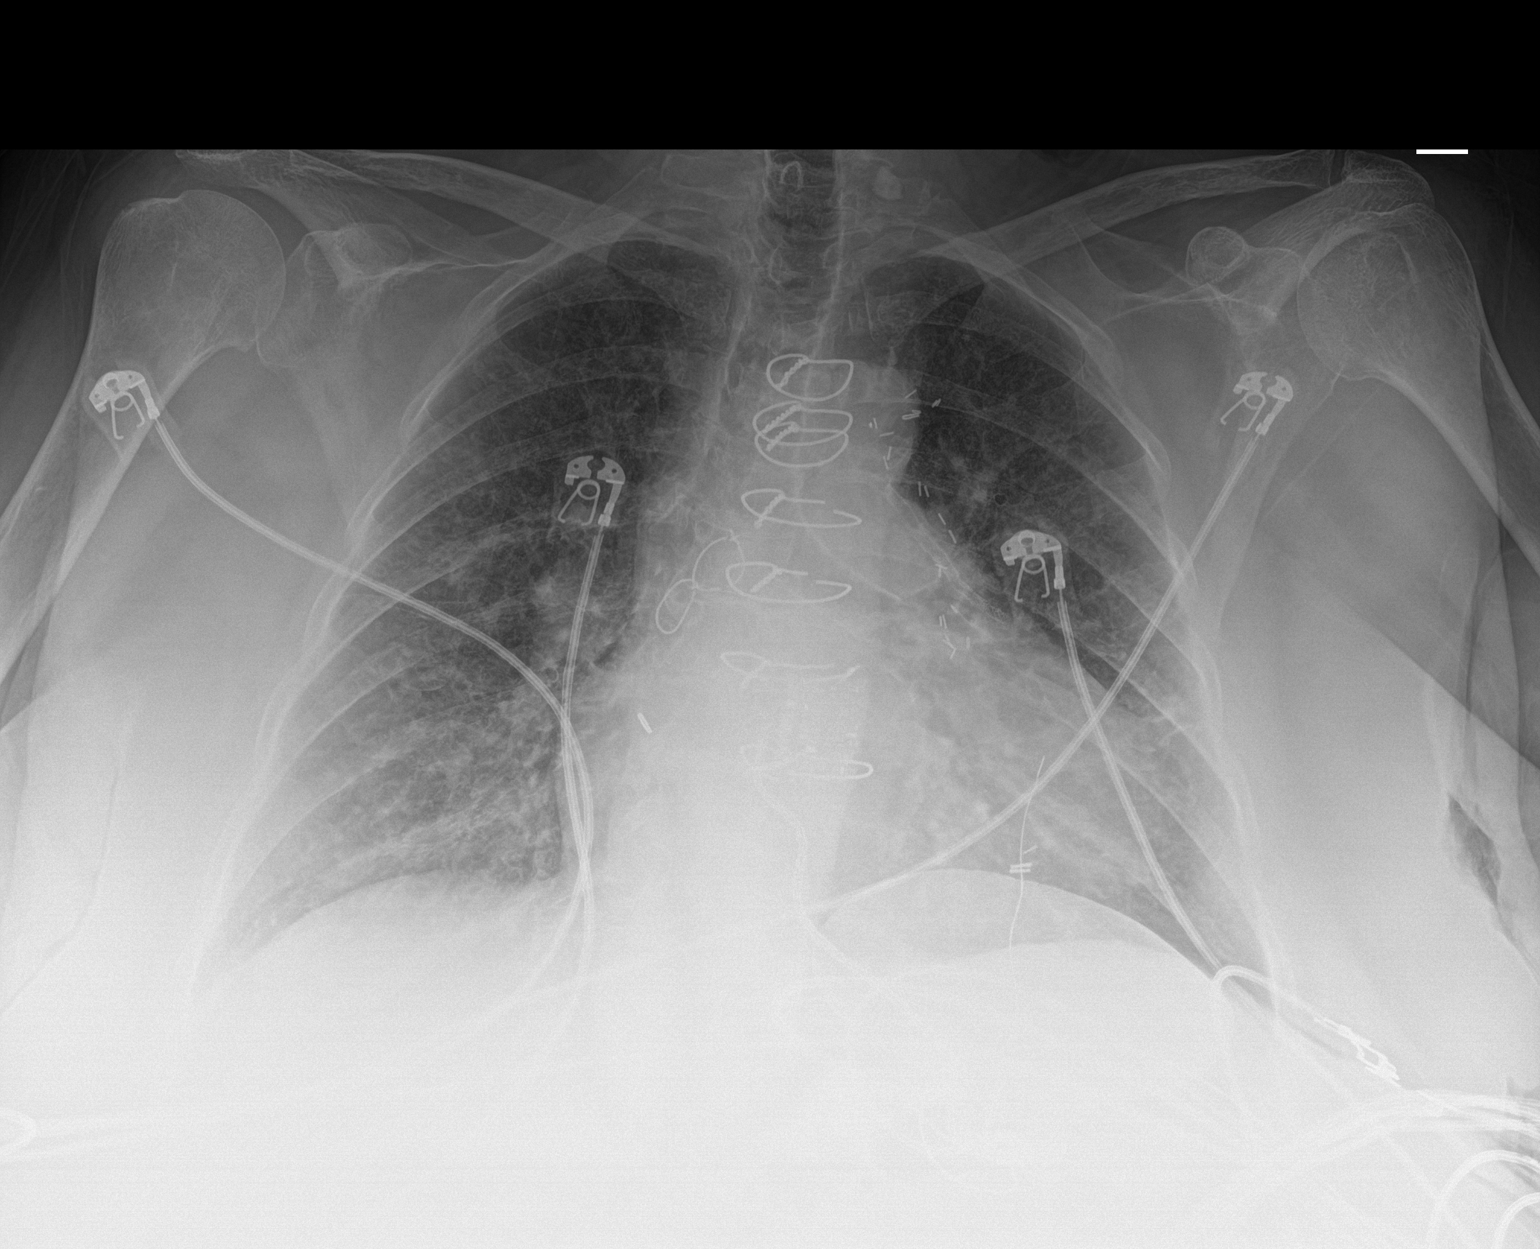

[1 of 1 positions shown; findings below may reference images not displayed]

FINDINGS: Lungs are clear. No pneumothorax or pleural effusion. Coronary
artery bypass grafting has been performed. Cardiac size is mildly
enlarged, unchanged. No acute bone abnormality.
IMPRESSION: No active disease.  Stable cardiomegaly.

## 2022-05-29 IMAGING — DX DG CHEST 1V PORT
1 series · 1 of 1 positions shown · non-contrast
Comparison: 05/12/2021

CLINICAL DATA: Fever

EXAM:
PORTABLE CHEST 1 VIEW

[chest]
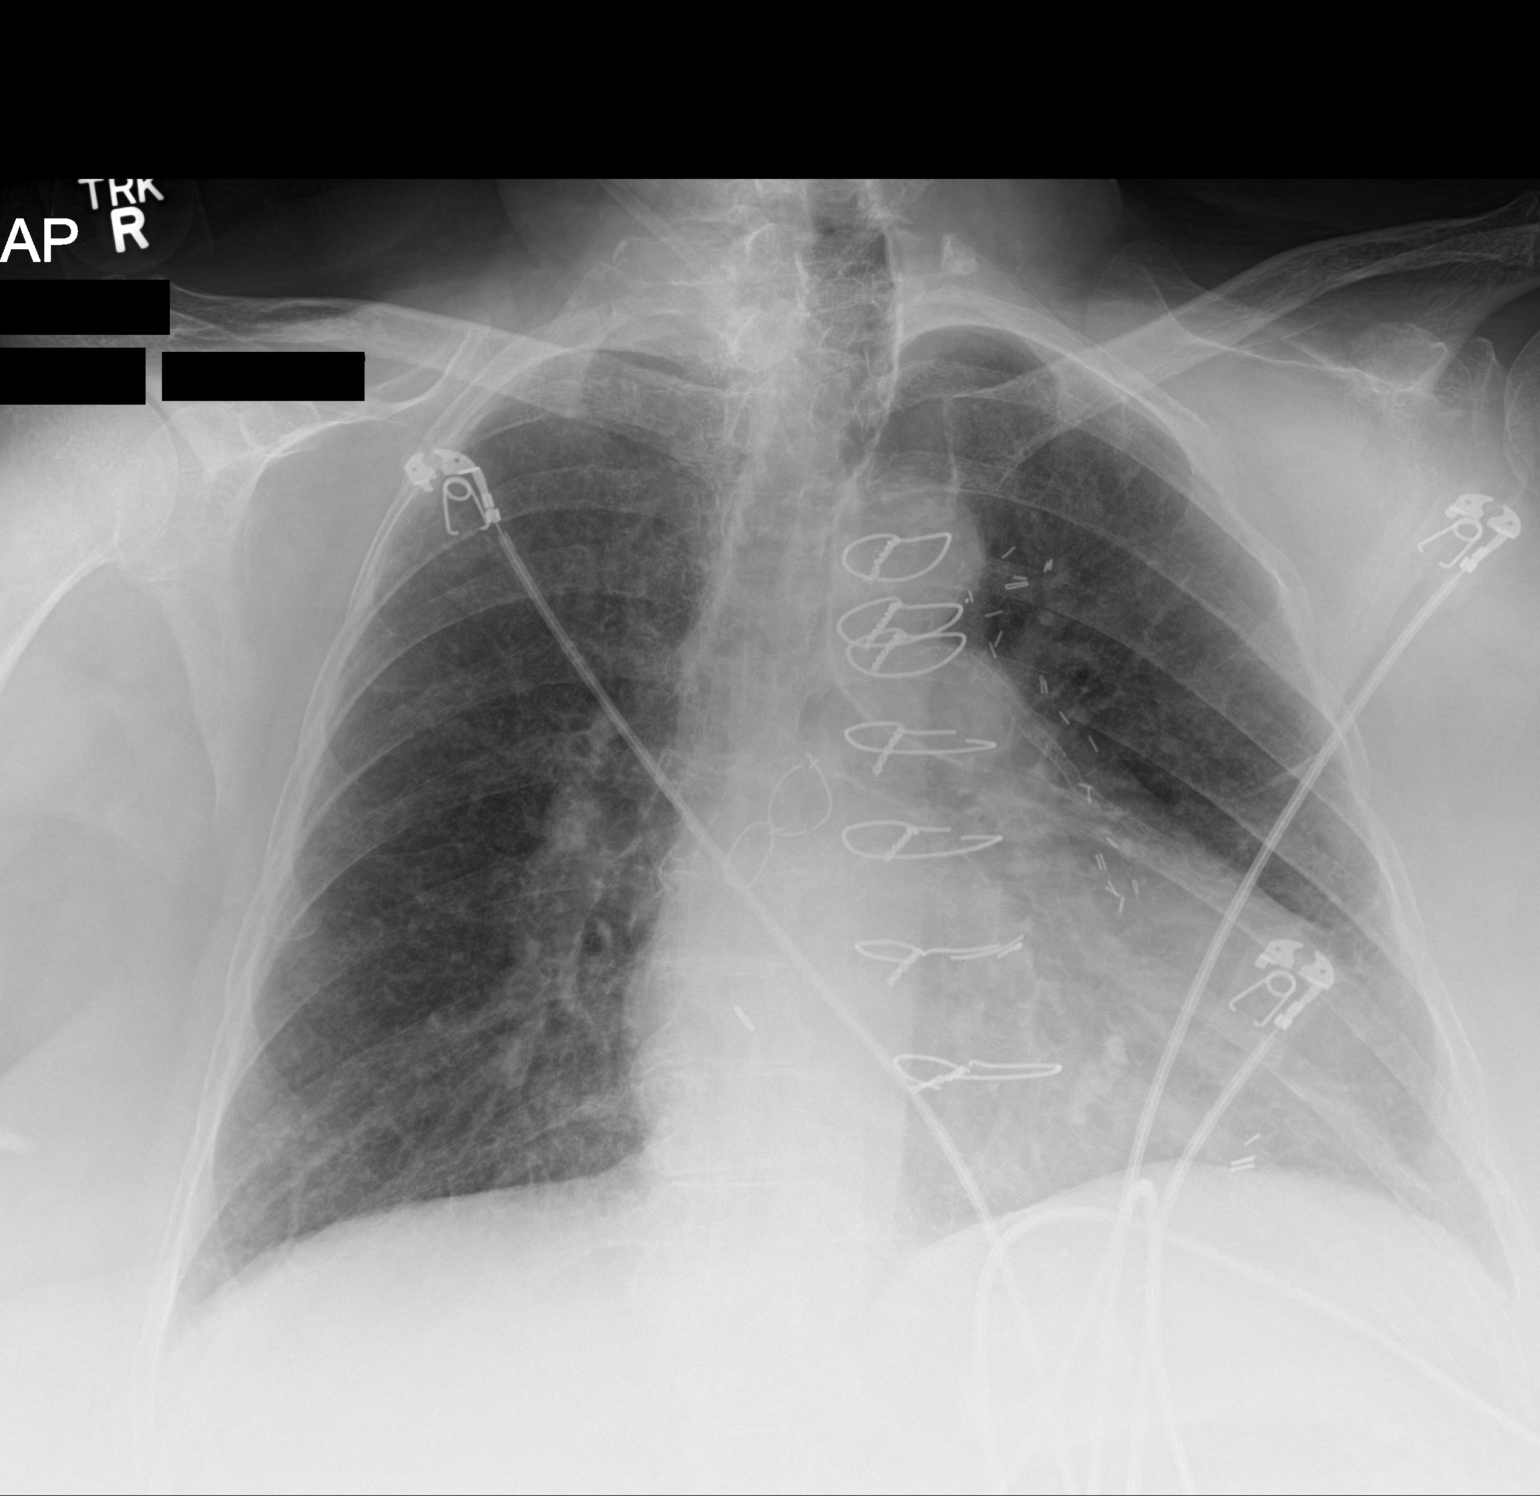

[1 of 1 positions shown; findings below may reference images not displayed]

FINDINGS: Prior CABG. Cardiomegaly. No confluent airspace opacities, effusions
or edema. No acute bony abnormality.
IMPRESSION: Cardiomegaly.

No active disease.

## 2022-06-07 ENCOUNTER — Other Ambulatory Visit: Payer: Self-pay | Admitting: Emergency Medicine

## 2022-06-11 ENCOUNTER — Telehealth: Payer: Self-pay

## 2022-06-11 ENCOUNTER — Other Ambulatory Visit: Payer: Self-pay | Admitting: Internal Medicine

## 2022-06-11 DIAGNOSIS — F411 Generalized anxiety disorder: Secondary | ICD-10-CM

## 2022-06-11 NOTE — Patient Outreach (Signed)
  Care Coordination   06/11/2022 Name: Katherine [name] MRN: [id number] DOB: 03-01-1956   Care Coordination Outreach Attempts:  An unsuccessful telephone outreach was attempted today to offer the patient information about available care coordination services as a benefit of their health plan.  Call attempts x4-Unable to connect to number.   Follow Up Plan:  Additional outreach attempts will be made to offer the patient care coordination information and services.   Encounter Outcome:  No Answer  Care Coordination Interventions Activated:  No   Care Coordination Interventions:  No, not indicated    [name], RN, MSN, BSN, [place] Coordinator [phone]

## 2022-06-12 ENCOUNTER — Telehealth: Payer: Self-pay | Admitting: *Deleted

## 2022-06-12 NOTE — Chronic Care Management (AMB) (Signed)
  Care Coordination Note  06/12/2022 Name: Julicia Krieger MRN: [id number] DOB: 10-Jan-1956  Renad Jenniges is a 66 y.o. year old female who is a primary care patient of [name], MD and is actively engaged with the care management team. I reached out to [name] by phone today to assist with re-scheduling a follow up visit with the RN Case Manager  Follow up plan: Unsuccessful telephone outreach attempt made. MyChart message also sent since unable to connect phone call.  Julian Hy, [place] Direct Dial: [phone]

## 2022-06-13 ENCOUNTER — Other Ambulatory Visit: Payer: Self-pay | Admitting: Cardiovascular Disease

## 2022-06-15 ENCOUNTER — Telehealth: Payer: Self-pay | Admitting: Cardiology

## 2022-06-15 NOTE — Telephone Encounter (Signed)
Pt c/o medication issue:  1. Name of Medication: nitroGLYCERIN (NITROSTAT) 0.4 MG SL tablet   2. How are you currently taking this medication (dosage and times per day)? 1x daily    3. Are you having a reaction (difficulty breathing--STAT)?   4. What is your medication issue? Pt states she has to take this everyday due to the state of her heart. She is worried that she will run out of this and can not make a f/u appt due to being unable to get out of bed she states. She would like to know if she can schedule a virtual appt with Dr. Ellyn Hack. Requesting call back.

## 2022-06-15 NOTE — Telephone Encounter (Signed)
This is Dr. Harding's pt. °

## 2022-06-15 NOTE — Telephone Encounter (Signed)
Returned the call to the patient. Her last visit was in 05/2021 and this was virtual. She would like to do another virtual visit with Dr. Ellyn Hack.  She stated that she needs more nitroglycerin since she is almost out. She takes Ranexa 1000 mg once daily, Imdur 60 mg once daily and nitroglycerin daily as well. She stated that when she wakes up in the middle of the night from a nightmare and her heart rate is up, that this causes chest pain. The only thing that makes it better is the nitroglycerin.   The patient became up set over the phone stating that the nitroglycerin is the only thing that calms her heart rate down and prevents her from having  a heart attack. She stated that she cannot come in for an appointment because it is too difficult.   Patient would like to set up a virtual appointment for nitro refill.

## 2022-06-15 NOTE — Telephone Encounter (Signed)
I think we can figure out how to do a virtual visit.  Ranexa - meant to be bid -- at least take 1/2 tablet 2 x daily   Can refill ntg.  = take Imdur before bead.   [name], MD

## 2022-06-16 ENCOUNTER — Ambulatory Visit (INDEPENDENT_AMBULATORY_CARE_PROVIDER_SITE_OTHER): Payer: Medicare Other | Admitting: Internal Medicine

## 2022-06-16 ENCOUNTER — Ambulatory Visit: Payer: Medicare Other | Admitting: Internal Medicine

## 2022-06-16 ENCOUNTER — Encounter: Payer: Self-pay | Admitting: Internal Medicine

## 2022-06-16 VITALS — BP 152/92 | HR 74 | Temp 98.0°F | Resp 16 | Ht 61.0 in | Wt 190.0 lb

## 2022-06-16 DIAGNOSIS — J9611 Chronic respiratory failure with hypoxia: Secondary | ICD-10-CM

## 2022-06-16 DIAGNOSIS — Z23 Encounter for immunization: Secondary | ICD-10-CM

## 2022-06-16 DIAGNOSIS — I2510 Atherosclerotic heart disease of native coronary artery without angina pectoris: Secondary | ICD-10-CM

## 2022-06-16 DIAGNOSIS — E119 Type 2 diabetes mellitus without complications: Secondary | ICD-10-CM

## 2022-06-16 DIAGNOSIS — Z9861 Coronary angioplasty status: Secondary | ICD-10-CM

## 2022-06-16 DIAGNOSIS — N3000 Acute cystitis without hematuria: Secondary | ICD-10-CM

## 2022-06-16 DIAGNOSIS — I10 Essential (primary) hypertension: Secondary | ICD-10-CM

## 2022-06-16 DIAGNOSIS — E785 Hyperlipidemia, unspecified: Secondary | ICD-10-CM

## 2022-06-16 DIAGNOSIS — F333 Major depressive disorder, recurrent, severe with psychotic symptoms: Secondary | ICD-10-CM

## 2022-06-16 DIAGNOSIS — Z794 Long term (current) use of insulin: Secondary | ICD-10-CM

## 2022-06-16 DIAGNOSIS — E876 Hypokalemia: Secondary | ICD-10-CM

## 2022-06-16 DIAGNOSIS — T502X5A Adverse effect of carbonic-anhydrase inhibitors, benzothiadiazides and other diuretics, initial encounter: Secondary | ICD-10-CM

## 2022-06-16 DIAGNOSIS — R2232 Localized swelling, mass and lump, left upper limb: Secondary | ICD-10-CM

## 2022-06-16 DIAGNOSIS — E1169 Type 2 diabetes mellitus with other specified complication: Secondary | ICD-10-CM

## 2022-06-16 LAB — BASIC METABOLIC PANEL
BUN: 13 mg/dL (ref 6–23)
CO2: 33 mEq/L — ABNORMAL HIGH (ref 19–32)
Calcium: 9.5 mg/dL (ref 8.4–10.5)
Chloride: 91 mEq/L — ABNORMAL LOW (ref 96–112)
Creatinine, Ser: 0.92 mg/dL (ref 0.40–1.20)
GFR: 65.58 mL/min (ref >60.00)
Glucose, Bld: 193 mg/dL — ABNORMAL HIGH (ref 70–99)
Potassium: 3.1 mEq/L — ABNORMAL LOW (ref 3.5–5.1)
Sodium: 134 mEq/L — ABNORMAL LOW (ref 135–145)

## 2022-06-16 LAB — HEMOGLOBIN A1C: Hgb A1c MFr Bld: 8.5 % — ABNORMAL HIGH (ref 4.6–6.5)

## 2022-06-16 LAB — MICROALBUMIN / CREATININE URINE RATIO
Creatinine,U: 48.4 mg/dL
Microalb Creat Ratio: 1.4 mg/g (ref 0.0–30.0)
Microalb, Ur: <0.7 mg/dL (ref 0.0–1.9)

## 2022-06-16 MED ORDER — CARIPRAZINE HCL 1.5 MG PO CAPS: 1.5000 mg | ORAL_CAPSULE | Freq: Every day | ORAL | 1 refills | Status: DC

## 2022-06-16 MED ORDER — SERTRALINE HCL 100 MG PO TABS: 200.0000 mg | ORAL_TABLET | Freq: Every day | ORAL | 1 refills | Status: DC

## 2022-06-16 NOTE — Progress Notes (Signed)
Subjective:  Patient ID: Katherine [name], female    DOB: 1956-06-19  Age: 66 y.o. MRN: [id number]  CC: Hypertension, Diabetes, and Coronary Artery Disease   HPI Katherine [name] presents for f/up -   She complains of poor appetite, chronic chest pain, chronic dyspnea on exertion, chronic nausea and intermittent vomiting, worsening depression, anxiety, apathy, feeling hopeless and helpless but not suicidal or homicidal.  She quit taking olanzapine because it caused side effects.  Outpatient Medications Prior to Visit  Medication Sig Dispense Refill   apixaban (ELIQUIS) 5 MG TABS tablet Take 1 tablet (5 mg total) by mouth 2 (two) times daily. 60 tablet 2   arformoterol (BROVANA) 15 MCG/2ML NEBU Take 2 mLs (15 mcg total) by nebulization 2 (two) times daily. 120 mL 12   azaTHIOprine (IMURAN) 50 MG tablet Take 2 & 1/2 tablets (125 mg) by mouth daily at 3pm (Patient taking differently: Take 125 mg by mouth daily.) 75 tablet 2   bisacodyl (DULCOLAX) 5 MG EC tablet Take 1 tablet (5 mg total) by mouth daily as needed for moderate constipation. 30 tablet 0   budesonide (PULMICORT) 0.5 MG/2ML nebulizer solution Take 2 mLs (0.5 mg total) by nebulization 2 (two) times daily. (Patient taking differently: Take 2 mLs by nebulization 2 (two) times daily as needed (wheezing).) 360 mL 1   calcitRIOL (ROCALTROL) 0.25 MCG capsule Take 0.25 mcg by mouth See admin instructions. Takes every 3 days.     clopidogrel (PLAVIX) 75 MG tablet Take 75 mg by mouth daily.     [employer] VITAMIN C 500 MG tablet TAKE 1 TABLET (500 MG TOTAL) BY MOUTH DAILY. 30 tablet 1   cyclobenzaprine (FLEXERIL) 10 MG tablet TAKE 1 TABLET BY MOUTH TWICE A DAY AS NEEDED FOR MUSCLE SPASM 180 tablet 0   Ensure Max Protein (ENSURE MAX PROTEIN) LIQD Take 330 mLs (11 oz total) by mouth 2 (two) times daily.     fluticasone (FLONASE) 50 MCG/ACT nasal spray PLACE 2 SPRAYS INTO BOTH NOSTRILS 2 TIMES DAILY. 48 mL 0   furosemide (LASIX) 40 MG tablet  TAKE 1 TABLET BY MOUTH EVERY DAY 90 tablet 0   gabapentin (NEURONTIN) 100 MG capsule Take 1 capsule (100 mg total) by mouth at bedtime. 30 capsule 1   glimepiride (AMARYL) 4 MG tablet Take 4 mg by mouth daily with breakfast.     insulin aspart (NOVOLOG FLEXPEN) 100 UNIT/ML FlexPen Inject 4-12 Units into the skin 3 (three) times daily with meals. Sliding scale.     Insulin Pen Needle (PEN NEEDLES) 32G X 4 MM MISC Use as needed to administer insulin. E11.9 100 each 1   isosorbide mononitrate (IMDUR) 60 MG 24 hr tablet Take 1 tablet (60 mg total) by mouth daily. 90 tablet 1   lamoTRIgine (LAMICTAL) 25 MG tablet TAKE 1 TABLET BY MOUTH EVERYDAY AT BEDTIME 93 tablet 1   LORazepam (ATIVAN) 2 MG/ML concentrated solution PLACE 0.5 MLS (1 MG TOTAL) UNDER THE TONGUE EVERY 8 (EIGHT) HOURS AS NEEDED FOR ANXIETY, SEIZURE, SEDATION OR SLEEP (DISTRESS). 30 mL 1   metFORMIN (GLUCOPHAGE) 500 MG tablet Take 500 mg by mouth 2 (two) times daily.     metoprolol tartrate (LOPRESSOR) 25 MG tablet Take 1 tablet (25 mg total) by mouth 3 (three) times daily. 270 tablet 0   montelukast (SINGULAIR) 10 MG tablet TAKE 1 TABLET BY MOUTH EVERYDAY AT BEDTIME 90 tablet 3   Multiple Vitamin (MULTIVITAMIN WITH MINERALS) TABS tablet Take 1 tablet by  mouth daily.     nitroGLYCERIN (NITROSTAT) 0.4 MG SL tablet PLACE 1 TAB UNDER TONGUE EVERY 5MINS FOR CHEST PAIN 75 tablet 2   Oxycodone HCl 10 MG TABS Take 1 tablet (10 mg total) by mouth every 8 (eight) hours as needed. 90 tablet 0   OXYGEN Inhale 4 L into the lungs continuous.     pantoprazole (PROTONIX) 40 MG tablet Take 1 tablet (40 mg total) by mouth daily. 90 tablet 1   predniSONE (DELTASONE) 5 MG tablet Take 5 mg by mouth daily with breakfast.     promethazine (PHENERGAN) 25 MG tablet Take 1 tablet (25 mg total) by mouth every 6 (six) hours as needed for nausea or vomiting. 30 tablet 0   ranolazine (RANEXA) 1000 MG SR tablet TAKE 1 TABLET BY MOUTH TWICE A DAY 180 tablet 0    rosuvastatin (CRESTOR) 20 MG tablet TAKE 1 TABLET BY MOUTH EVERY DAY 90 tablet 1   VENTOLIN HFA 108 (90 Base) MCG/ACT inhaler TAKE 2 PUFFS BY MOUTH EVERY 6 HOURS AS NEEDED FOR WHEEZE OR SHORTNESS OF BREATH 18 each 0   Insulin Glargine (BASAGLAR KWIKPEN) 100 UNIT/ML Inject 20 Units into the skin daily.     OLANZapine (ZYPREXA) 5 MG tablet TAKE 1 TABLET BY MOUTH EVERY DAY AT 6PM 30 tablet 3   sertraline (ZOLOFT) 100 MG tablet Take 1 tablet (100 mg total) by mouth daily. (Patient taking differently: Take 100 mg by mouth at bedtime.) 90 tablet 2   No facility-administered medications prior to visit.    ROS Review of Systems  Constitutional:  Positive for appetite change and fatigue. Negative for activity change, chills, diaphoresis, fever and unexpected weight change.  HENT: Negative.  Negative for trouble swallowing.   Eyes: Negative.   Respiratory:  Positive for shortness of breath. Negative for cough, choking, chest tightness and wheezing.   Cardiovascular:  Positive for chest pain. Negative for palpitations and leg swelling.  Gastrointestinal:  Positive for abdominal pain, nausea and vomiting. Negative for blood in stool, constipation and diarrhea.  Genitourinary:  Positive for dysuria and urgency. Negative for difficulty urinating, flank pain, frequency and hematuria.  Musculoskeletal:  Positive for arthralgias. Negative for myalgias.  Skin: Negative.   Neurological: Negative.  Negative for dizziness, weakness and light-headedness.  Hematological:  Negative for adenopathy. Does not bruise/bleed easily.  Psychiatric/Behavioral:  Positive for dysphoric mood and sleep disturbance. Negative for behavioral problems, confusion, decreased concentration and suicidal ideas. The patient is nervous/anxious.     Objective:  BP (!) 152/92 (BP Location: Right Arm, Patient Position: Sitting, Cuff Size: Large)   Pulse 74   Temp 98 F (36.7 C) (Oral)   Resp 16   Ht '5\' 1"'$  (1.549 m)   Wt 190 lb (86.2  kg)   SpO2 98%   BMI 35.90 kg/m   BP Readings from Last 3 Encounters:  06/16/22 (!) 152/92  02/02/22 128/84  12/13/21 (!) 108/55    Wt Readings from Last 3 Encounters:  06/16/22 190 lb (86.2 kg)  02/02/22 192 lb (87.1 kg)  12/12/21 196 lb 10.4 oz (89.2 kg)    Physical Exam Vitals reviewed.  Constitutional:      General: She is not in acute distress.    Appearance: She is ill-appearing (in a wheelchair on O2). She is not toxic-appearing or diaphoretic.  HENT:     Mouth/Throat:     Mouth: Mucous membranes are moist.  Eyes:     General: No scleral icterus.  Conjunctiva/sclera: Conjunctivae normal.  Cardiovascular:     Rate and Rhythm: Normal rate and regular rhythm.     Heart sounds: No murmur heard. Pulmonary:     Effort: Pulmonary effort is normal.     Breath sounds: No stridor. No wheezing, rhonchi or rales.  Abdominal:     General: Abdomen is protuberant. Bowel sounds are normal. There is no distension.     Palpations: Abdomen is soft. There is no hepatomegaly, splenomegaly or mass.     Tenderness: There is no abdominal tenderness.  Musculoskeletal:        General: Normal range of motion.     Cervical back: Neck supple.  Lymphadenopathy:     Cervical: No cervical adenopathy.  Skin:    General: Skin is warm and dry.     Findings: Lesion present.  Neurological:     General: No focal deficit present.     Mental Status: She is alert. Mental status is at baseline.  Psychiatric:        Attention and Perception: Perception normal. She is inattentive.        Mood and Affect: Mood is anxious and depressed. Mood is not elated. Affect is not flat.        Speech: Speech is tangential. Speech is not delayed.        Behavior: Behavior normal. Behavior is cooperative.        Thought Content: Thought content normal. Thought content is not paranoid or delusional. Thought content does not include homicidal or suicidal ideation.        Cognition and Memory: Cognition normal.         Judgment: Judgment normal.     Lab Results  Component Value Date   WBC 11.2 (H) 02/02/2022   HGB 12.2 02/02/2022   HCT 37.0 02/02/2022   PLT 234.0 02/02/2022   GLUCOSE 193 (H) 06/16/2022   CHOL 127 02/02/2022   TRIG 186.0 (H) 02/02/2022   HDL 66.80 02/02/2022   LDLCALC 23 02/02/2022   ALT 7 02/02/2022   AST 12 02/02/2022   NA 134 (L) 06/16/2022   K 3.1 (L) 06/16/2022   CL 91 (L) 06/16/2022   CREATININE 0.92 06/16/2022   BUN 13 06/16/2022   CO2 33 (H) 06/16/2022   TSH 1.872 10/19/2021   INR 1.2 01/31/2021   HGBA1C 8.5 (H) 06/16/2022   MICROALBUR <0.7 06/16/2022    ECHOCARDIOGRAM COMPLETE  Result Date: 12/09/2021    ECHOCARDIOGRAM REPORT   Patient Name:   RASEEL JANS Date of Exam: 12/08/2021 Medical Rec #:  [id number]          Height:       61.0 in Accession #:    [id number]         Weight:       180.0 lb Date of Birth:  August 04, 1956          BSA:          1.806 m Patient Age:    34 years           BP:           91/61 mmHg Patient Gender: F                  HR:           90 bpm. Exam Location:  Inpatient Procedure: 2D Echo, Cardiac Doppler, Color Doppler and Intracardiac            Opacification Agent Indications:  NSTEMI I21.4  History:        Patient has prior history of Echocardiogram examinations, most                 recent 10/02/2021. Previous Myocardial Infarction and CAD, Stroke                 and COPD, Arrythmias:Atrial Fibrillation; Risk                 Factors:Hypertension, Diabetes, Dyslipidemia and Sleep Apnea.  Sonographer:    Bernadene Person RDCS Referring Phys: [id number] Tony  1. Left ventricular ejection fraction, by estimation, is 45 to 50%. The left ventricle has mildly decreased function. The left ventricle demonstrates regional wall motion abnormalities (see scoring diagram/findings for description). There is mild concentric left ventricular hypertrophy. Left ventricular diastolic parameters are consistent with Grade III diastolic  dysfunction (restrictive). There is akinesis of the left ventricular, mid inferoseptal wall. There is akinesis of the left ventricular,  apical septal wall.  2. Right ventricular systolic function is normal. The right ventricular size is normal. There is normal pulmonary artery systolic pressure. The estimated right ventricular systolic pressure is 45.4 mmHg.  3. Left atrial size was mildly dilated.  4. The mitral valve is normal in structure. Trivial mitral valve regurgitation. Mild to moderate mitral stenosis. Moderate mitral annular calcification.  5. The aortic valve was not well visualized. There is moderate calcification of the aortic valve. Aortic valve regurgitation is trivial. Mild aortic valve stenosis. Aortic valve area, by VTI measures 1.34 cm. Aortic valve mean gradient measures 10.0 mmHg. Aortic valve Vmax measures 2.20 m/s. LV Stroke Volume index 30, DVI 0.39.  6. The inferior vena cava is normal in size with greater than 50% respiratory variability, suggesting right atrial pressure of 3 mmHg.  7. IT Issues from 12/08/21. Study resent 12/09/21 for read and review. Comparison(s): Similar to prior. FINDINGS  Left Ventricle: Left ventricular ejection fraction, by estimation, is 45 to 50%. The left ventricle has mildly decreased function. The left ventricle demonstrates regional wall motion abnormalities. Definity contrast agent was given IV to delineate the left ventricular endocardial borders. The left ventricular internal cavity size was normal in size. There is mild concentric left ventricular hypertrophy. Left ventricular diastolic function could not be evaluated due to atrial fibrillation. Left ventricular diastolic parameters are consistent with Grade III diastolic dysfunction (restrictive).  LV Wall Scoring: The apex is dyskinetic. The inferior septum is akinetic. Right Ventricle: The right ventricular size is normal. No increase in right ventricular wall thickness. Right ventricular systolic  function is normal. There is normal pulmonary artery systolic pressure. The tricuspid regurgitant velocity is 2.75 m/s, and  with an assumed right atrial pressure of 3 mmHg, the estimated right ventricular systolic pressure is 09.8 mmHg. Left Atrium: Left atrial size was mildly dilated. Right Atrium: Right atrial size was normal in size. Pericardium: There is no evidence of pericardial effusion. Mitral Valve: The mitral valve is normal in structure. Moderate mitral annular calcification. Trivial mitral valve regurgitation. Mild to moderate mitral valve stenosis. Tricuspid Valve: The tricuspid valve is normal in structure. Tricuspid valve regurgitation is mild . No evidence of tricuspid stenosis. Aortic Valve: The aortic valve was not well visualized. There is moderate calcification of the aortic valve. Aortic valve regurgitation is trivial. Mild aortic stenosis is present. Aortic valve mean gradient measures 10.0 mmHg. Aortic valve peak gradient  measures 19.4 mmHg. Aortic valve area, by VTI measures 1.34 cm. Pulmonic Valve:  The pulmonic valve was normal in structure. Pulmonic valve regurgitation is mild. No evidence of pulmonic stenosis. Aorta: The aortic root is normal in size and structure. Venous: The inferior vena cava is normal in size with greater than 50% respiratory variability, suggesting right atrial pressure of 3 mmHg. IAS/Shunts: No atrial level shunt detected by color flow Doppler.  LEFT VENTRICLE PLAX 2D LVIDd:         4.30 cm LVIDs:         3.30 cm LV PW:         1.20 cm LV IVS:        1.20 cm LVOT diam:     2.10 cm LV SV:         54 LV SV Index:   30 LVOT Area:     3.46 cm  LV Volumes (MOD) LV vol d, MOD A2C: 101.0 ml LV vol d, MOD A4C: 66.9 ml LV vol s, MOD A2C: 59.4 ml LV vol s, MOD A4C: 44.2 ml LV SV MOD A2C:     41.6 ml LV SV MOD A4C:     66.9 ml LV SV MOD BP:      31.7 ml RIGHT VENTRICLE TAPSE (M-mode): 1.6 cm LEFT ATRIUM             Index        RIGHT ATRIUM           Index LA diam:         4.20 cm 2.33 cm/m   RA Area:     14.30 cm LA Vol (A2C):   63.4 ml 35.10 ml/m  RA Volume:   33.10 ml  18.33 ml/m LA Vol (A4C):   58.6 ml 32.45 ml/m LA Biplane Vol: 64.2 ml 35.55 ml/m  AORTIC VALVE AV Area (Vmax):    1.47 cm AV Area (Vmean):   1.39 cm AV Area (VTI):     1.34 cm AV Vmax:           220.33 cm/s AV Vmean:          148.333 cm/s AV VTI:            0.405 m AV Peak Grad:      19.4 mmHg AV Mean Grad:      10.0 mmHg LVOT Vmax:         93.30 cm/s LVOT Vmean:        59.533 cm/s LVOT VTI:          0.156 m LVOT/AV VTI ratio: 0.39  AORTA Ao Root diam: 3.40 cm Ao Asc diam:  3.10 cm TRICUSPID VALVE TR Peak grad:   30.2 mmHg TR Vmax:        275.00 cm/s  SHUNTS Systemic VTI:  0.16 m Systemic Diam: 2.10 cm Rudean Haskell MD Electronically signed by Rudean Haskell MD Signature Date/Time: 12/09/2021/5:07:12 PM    Final    DG Chest Port 1 View  Result Date: 12/07/2021 CLINICAL DATA:  Shortness of breath EXAM: PORTABLE CHEST 1 VIEW COMPARISON:  11/21/2021 FINDINGS: Unchanged mild interstitial pulmonary edema and cardiomegaly. No pneumothorax or sizable pleural effusion. Remote median sternotomy and CABG. IMPRESSION: Unchanged cardiomegaly and mild interstitial pulmonary edema. Electronically Signed   By: Ulyses Jarred M.D.   On: 12/07/2021 21:35    Assessment & Plan:   Brittny was seen today for hypertension, diabetes and coronary artery disease.  Diagnoses and all orders for this visit:  Essential hypertension- Her blood pressure is adequately well controlled.  Will treat the hypokalemia. -     Basic metabolic panel; Future -     Urinalysis, Routine w reflex microscopic; Future -     Urinalysis, Routine w reflex microscopic -     Basic metabolic panel -     potassium chloride SA (KLOR-CON M15) 15 MEQ tablet; Take 1 tablet (15 mEq total) by mouth 2 (two) times daily.  Chronic respiratory failure with hypoxia ([place])- She is doing well on O2.  Type 2 diabetes mellitus with hyperlipidemia  ([place])- Her blood sugar is well controlled. -     Hemoglobin A1c; Future -     Urinalysis, Routine w reflex microscopic; Future -     Microalbumin / creatinine urine ratio; Future -     Microalbumin / creatinine urine ratio -     Urinalysis, Routine w reflex microscopic -     Hemoglobin A1c  Dyslipidemia, goal LDL below 70  Mass of skin of left forearm -     Ambulatory referral to Plastic Surgery  Severe episode of recurrent major depressive disorder, with psychotic features ([place])- Will increase the dose of sertraline and add an atypical antipsychotic. -     sertraline (ZOLOFT) 100 MG tablet; Take 2 tablets (200 mg total) by mouth daily. -     cariprazine (VRAYLAR) 1.5 MG capsule; Take 1 capsule (1.5 mg total) by mouth daily.  Insulin-requiring or dependent type II diabetes mellitus (H[facility]) -     Insulin Glargine (BASAGLAR KWIKPEN) 100 UNIT/ML; Inject 30 Units into the skin daily. -     Continuous Blood Gluc Sensor (FREESTYLE LIBRE 2 SENSOR) MISC; 1 Act by Does not apply route daily. -     Continuous Blood Gluc Receiver (FREESTYLE LIBRE 2 READER) DEVI; 1 Act by Does not apply route daily.  Diuretic-induced hypokalemia -     potassium chloride SA (KLOR-CON M15) 15 MEQ tablet; Take 1 tablet (15 mEq total) by mouth 2 (two) times daily.  Acute cystitis without hematuria -     nitrofurantoin, macrocrystal-monohydrate, (MACROBID) 100 MG capsule; Take 1 capsule (100 mg total) by mouth 2 (two) times daily for 5 days.  Other orders -     Flu Vaccine QUAD 6+ mos PF IM (Fluarix Quad PF)   I have discontinued Maci L. Miotke's OLANZapine. I have also changed her sertraline and Basaglar KwikPen. Additionally, I am having her start on cariprazine, FreeStyle Libre 2 Sensor, [employer] 2 Reader, potassium chloride SA, and nitrofurantoin (macrocrystal-monohydrate). Lastly, I am having her maintain her budesonide, arformoterol, OXYGEN, gabapentin, bisacodyl, clopidogrel, NovoLOG FlexPen,  predniSONE, azaTHIOprine, glimepiride, calcitRIOL, metFORMIN, Ensure Max Protein, multivitamin with minerals, isosorbide mononitrate, Pen Needles, [employer] Vitamin C, pantoprazole, lamoTRIgine, rosuvastatin, Ventolin HFA, fluticasone, apixaban, cyclobenzaprine, montelukast, metoprolol tartrate, furosemide, ranolazine, promethazine, Oxycodone HCl, LORazepam, and nitroGLYCERIN.  Meds ordered this encounter  Medications   sertraline (ZOLOFT) 100 MG tablet    Sig: Take 2 tablets (200 mg total) by mouth daily.    Dispense:  180 tablet    Refill:  1   cariprazine (VRAYLAR) 1.5 MG capsule    Sig: Take 1 capsule (1.5 mg total) by mouth daily.    Dispense:  90 capsule    Refill:  1   Insulin Glargine (BASAGLAR KWIKPEN) 100 UNIT/ML    Sig: Inject 30 Units into the skin daily.    Dispense:  27 mL    Refill:  1   Continuous Blood Gluc Sensor (FREESTYLE LIBRE 2 SENSOR) MISC    Sig: 1 Act by  Does not apply route daily.    Dispense:  2 each    Refill:  5   Continuous Blood Gluc Receiver (FREESTYLE LIBRE 2 READER) DEVI    Sig: 1 Act by Does not apply route daily.    Dispense:  2 each    Refill:  5   potassium chloride SA (KLOR-CON M15) 15 MEQ tablet    Sig: Take 1 tablet (15 mEq total) by mouth 2 (two) times daily.    Dispense:  180 tablet    Refill:  1   nitrofurantoin, macrocrystal-monohydrate, (MACROBID) 100 MG capsule    Sig: Take 1 capsule (100 mg total) by mouth 2 (two) times daily for 5 days.    Dispense:  10 capsule    Refill:  0     Follow-up: Return in about 3 months (around 09/16/2022).  Scarlette Calico, MD

## 2022-06-16 NOTE — Patient Instructions (Signed)
Type 2 Diabetes Mellitus, Diagnosis, Adult Type 2 diabetes (type 2 diabetes mellitus) is a long-term, or chronic, disease. In type 2 diabetes, one or both of these problems may be present: The pancreas does not make enough of a hormone called insulin. Cells in the body do not respond properly to the insulin that the body makes (insulin resistance). Normally, insulin allows blood sugar (glucose) to enter cells in the body. The cells use glucose for energy. Insulin resistance or lack of insulin causes excess glucose to build up in the blood instead of going into cells. This causes high blood glucose (hyperglycemia).  What are the causes? The exact cause of type 2 diabetes is not known. What increases the risk? The following factors may make you more likely to develop this condition: Having a family member with type 2 diabetes. Being overweight or obese. Being inactive (sedentary). Having been diagnosed with insulin resistance. Having a history of prediabetes, diabetes when you were pregnant (gestational diabetes), or polycystic ovary syndrome (PCOS). What are the signs or symptoms? In the early stage of this condition, you may not have symptoms. Symptoms develop slowly and may include: Increased thirst or hunger. Increased urination. Unexplained weight loss. Tiredness (fatigue) or weakness. Vision changes, such as blurry vision. Dark patches on the skin. How is this diagnosed? This condition is diagnosed based on your symptoms, your medical history, a physical exam, and your blood glucose level. Your blood glucose may be checked with one or more of the following blood tests: A fasting blood glucose (FBG) test. You will not be allowed to eat (you will fast) for 8 hours or longer before a blood sample is taken. A random blood glucose test. This test checks blood glucose at any time of day regardless of when you ate. An A1C (hemoglobin A1C) blood test. This test provides information about blood  glucose levels over the previous 2-3 months. An oral glucose tolerance test (OGTT). This test measures your blood glucose at two times: After fasting. This is your baseline blood glucose level. Two hours after drinking a beverage that contains glucose. You may be diagnosed with type 2 diabetes if: Your fasting blood glucose level is 126 mg/dL (7.0 mmol/L) or higher. Your random blood glucose level is 200 mg/dL (11.1 mmol/L) or higher. Your A1C level is 6.5% or higher. Your oral glucose tolerance test result is higher than 200 mg/dL (11.1 mmol/L). These blood tests may be repeated to confirm your diagnosis. How is this treated? Your treatment may be managed by a specialist called an endocrinologist. Type 2 diabetes may be treated by following instructions from your health care provider about: Making dietary and lifestyle changes. These may include: Following a personalized nutrition plan that is developed by a registered dietitian. Exercising regularly. Finding ways to manage stress. Checking your blood glucose level as often as told. Taking diabetes medicines or insulin daily. This helps to keep your blood glucose levels in the healthy range. Taking medicines to help prevent complications from diabetes. Medicines may include: Aspirin. Medicine to lower cholesterol. Medicine to control blood pressure. Your health care provider will set treatment goals for you. Your goals will be based on your age, other medical conditions you have, and how you respond to diabetes treatment. Generally, the goal of treatment is to maintain the following blood glucose levels: Before meals: 80-130 mg/dL (4.4-7.2 mmol/L). After meals: below 180 mg/dL (10 mmol/L). A1C level: less than 7%. Follow these instructions at home: Questions to ask your health care provider   Consider asking the following questions: Should I meet with a certified diabetes care and education specialist? What diabetes medicines do I need,  and when should I take them? What equipment will I need to manage my diabetes at home? How often do I need to check my blood glucose? Where can I find a support group for people with diabetes? What number can I call if I have questions? When is my next appointment? General instructions Take over-the-counter and prescription medicines only as told by your health care provider. Keep all follow-up visits. This is important. Where to find more information For help and guidance and for more information about diabetes, please visit: American Diabetes Association (ADA): www.diabetes.org American Association of Diabetes Care and Education Specialists (ADCES): www.diabeteseducator.org International Diabetes Federation (IDF): www.idf.org Contact a health care provider if: Your blood glucose is at or above 240 mg/dL (13.3 mmol/L) for 2 days in a row. You have been sick or have had a fever for 2 days or longer, and you are not getting better. You have any of the following problems for more than 6 hours: You cannot eat or drink. You have nausea and vomiting. You have diarrhea. Get help right away if: You have severe hypoglycemia. This means your blood glucose is lower than 54 mg/dL (3.0 mmol/L). You become confused or you have trouble thinking clearly. You have difficulty breathing. You have moderate or large ketone levels in your urine. These symptoms may represent a serious problem that is an emergency. Do not wait to see if the symptoms will go away. Get medical help right away. Call your local emergency services (911 in the [place] Do not drive yourself to the hospital. Summary Type 2 diabetes mellitus is a long-term, or chronic, disease. In type 2 diabetes, the pancreas does not make enough of a hormone called insulin, or cells in the body do not respond properly to insulin that the body makes. This condition is treated by making dietary and lifestyle changes and taking diabetes medicines or  insulin. Your health care provider will set treatment goals for you. Your goals will be based on your age, other medical conditions you have, and how you respond to diabetes treatment. Keep all follow-up visits. This is important. This information is not intended to replace advice given to you by your health care provider. Make sure you discuss any questions you have with your health care provider. Document Revised: 10/28/2020 Document Reviewed: 10/28/2020 Elsevier Patient Education  2023 Elsevier Inc.  

## 2022-06-17 ENCOUNTER — Telehealth: Payer: Self-pay | Admitting: Internal Medicine

## 2022-06-17 DIAGNOSIS — T502X5A Adverse effect of carbonic-anhydrase inhibitors, benzothiadiazides and other diuretics, initial encounter: Secondary | ICD-10-CM | POA: Insufficient documentation

## 2022-06-17 DIAGNOSIS — E119 Type 2 diabetes mellitus without complications: Secondary | ICD-10-CM | POA: Insufficient documentation

## 2022-06-17 LAB — URINALYSIS, ROUTINE W REFLEX MICROSCOPIC
Bilirubin Urine: NEGATIVE
Hgb urine dipstick: NEGATIVE
Ketones, ur: NEGATIVE
Nitrite: NEGATIVE
RBC / HPF: NONE SEEN (ref 0–?)
Specific Gravity, Urine: 1.015 (ref 1.000–1.030)
Total Protein, Urine: NEGATIVE
Urine Glucose: 250 — AB
Urobilinogen, UA: 0.2 (ref 0.0–1.0)
pH: 5.5 (ref 5.0–8.0)

## 2022-06-17 MED ORDER — POTASSIUM CHLORIDE CRYS ER 15 MEQ PO TBCR: 15.0000 meq | EXTENDED_RELEASE_TABLET | Freq: Two times a day (BID) | ORAL | 1 refills | Status: DC

## 2022-06-17 MED ORDER — FREESTYLE LIBRE 2 SENSOR MISC: 1.0000 | Freq: Every day | 5 refills | Status: DC

## 2022-06-17 MED ORDER — FREESTYLE LIBRE 2 READER DEVI: 1.0000 | Freq: Every day | 5 refills | Status: DC

## 2022-06-17 MED ORDER — BASAGLAR KWIKPEN 100 UNIT/ML ~~LOC~~ SOPN: 30.0000 [IU] | PEN_INJECTOR | Freq: Every day | SUBCUTANEOUS | 1 refills | Status: DC

## 2022-06-17 NOTE — Telephone Encounter (Signed)
Patient called and asked for a call back about lab results from yesterday when they come in. She said she was concerned with her urine being cloudy because she is a kidney recipient.

## 2022-06-18 ENCOUNTER — Telehealth: Payer: Self-pay | Admitting: Internal Medicine

## 2022-06-18 MED ORDER — NITROFURANTOIN MONOHYD MACRO 100 MG PO CAPS: 100.0000 mg | ORAL_CAPSULE | Freq: Two times a day (BID) | ORAL | 0 refills | Status: AC

## 2022-06-18 NOTE — Telephone Encounter (Signed)
Per MyChart lab dated 10/31:  "[place] -   There were some white blood cells in your urine.  Let me know if you have any symptoms of a urinary tract infection.  Your blood sugar is too high.  Have you been using the insulin?  Your potassium level is low.  Please start taking a potassium supplement.   Scarlette Calico, MD"  Pt stated that she does have UTI Sx, she also mentioned that she has been taking her insulin.

## 2022-06-18 NOTE — Telephone Encounter (Signed)
Pt called for lab results and to report possible UTI due to symptoms. Pt says burns and hurts when urinating. Pt says she has a history of UTI all her life and knows when it is happening.  Also pt is concerned about her kidney function and A1c and other lab results to see if she has other problems due to her history and symptoms.  Call pt [phone]

## 2022-06-19 NOTE — Telephone Encounter (Signed)
Pt wants to know results of CREATININE from lab results 06/16/22.  Please call pt at [phone]

## 2022-06-19 NOTE — Telephone Encounter (Signed)
Pt has been informed of result and expressed understanding.

## 2022-06-23 ENCOUNTER — Telehealth: Payer: Self-pay | Admitting: Internal Medicine

## 2022-06-23 NOTE — Telephone Encounter (Signed)
Patient would like someone to call her about the vraylar - it is making her nauseated.

## 2022-06-24 ENCOUNTER — Telehealth: Payer: Self-pay | Admitting: Internal Medicine

## 2022-06-24 ENCOUNTER — Other Ambulatory Visit: Payer: Self-pay | Admitting: Internal Medicine

## 2022-06-24 DIAGNOSIS — I10 Essential (primary) hypertension: Secondary | ICD-10-CM

## 2022-06-24 DIAGNOSIS — E876 Hypokalemia: Secondary | ICD-10-CM

## 2022-06-24 DIAGNOSIS — M1711 Unilateral primary osteoarthritis, right knee: Secondary | ICD-10-CM

## 2022-06-24 DIAGNOSIS — Z515 Encounter for palliative care: Secondary | ICD-10-CM

## 2022-06-24 DIAGNOSIS — M47816 Spondylosis without myelopathy or radiculopathy, lumbar region: Secondary | ICD-10-CM

## 2022-06-24 DIAGNOSIS — M4726 Other spondylosis with radiculopathy, lumbar region: Secondary | ICD-10-CM

## 2022-06-24 MED ORDER — OXYCODONE HCL 10 MG PO TABS: 10.0000 mg | ORAL_TABLET | Freq: Three times a day (TID) | ORAL | 0 refills | Status: DC | PRN

## 2022-06-24 MED ORDER — POTASSIUM CHLORIDE CRYS ER 10 MEQ PO TBCR: 10.0000 meq | EXTENDED_RELEASE_TABLET | Freq: Two times a day (BID) | ORAL | 0 refills | Status: DC

## 2022-06-24 NOTE — Telephone Encounter (Signed)
Patient pharmacy was out of [place] - please send in something else.  Patient also needs her oxycodone refilled - Please send to [facility] [address] - [place], [place]  Arman Filter is too stong and she would like something else sent in for that also.

## 2022-06-25 NOTE — Progress Notes (Signed)
  Care Coordination Note  06/25/2022 Name: Katherine [name] MRN: [id number] DOB: [birth year]  Katherine [name] is a 66 y.o. year old female who is a primary care patient of [name], MD and is actively engaged with the care management team. I reached out to [name] by phone today to assist with re-scheduling a follow up visit with the RN Case Manager  Follow up plan: Telephone appointment with care management team member scheduled for: 06/30/2022  Julian Hy, [place] Direct Dial: [phone]

## 2022-06-26 ENCOUNTER — Telehealth: Payer: Self-pay

## 2022-06-26 NOTE — Telephone Encounter (Signed)
Patient states that she was prescribed medication for a uti, she has completed the medicine and now has a fever. Wants to know what to do.

## 2022-06-26 NOTE — Telephone Encounter (Signed)
Pt has been scheduled for 11/20 per her request. She did not want to be seen by another provider. I recommended that she ben seen at the ED in the event that her fever does not break over the weekend. She expressed understanding.

## 2022-06-29 NOTE — Telephone Encounter (Signed)
Patient asked for a call back at [phone]

## 2022-06-29 NOTE — Telephone Encounter (Signed)
Called pt, LVM.   

## 2022-06-30 ENCOUNTER — Telehealth: Payer: Medicaid Other | Admitting: Cardiology

## 2022-06-30 ENCOUNTER — Ambulatory Visit: Payer: Self-pay

## 2022-06-30 ENCOUNTER — Other Ambulatory Visit: Payer: Self-pay | Admitting: Internal Medicine

## 2022-06-30 DIAGNOSIS — N3000 Acute cystitis without hematuria: Secondary | ICD-10-CM | POA: Insufficient documentation

## 2022-06-30 DIAGNOSIS — I10 Essential (primary) hypertension: Secondary | ICD-10-CM

## 2022-06-30 MED ORDER — NITROGLYCERIN 0.4 MG SL SUBL: SUBLINGUAL_TABLET | SUBLINGUAL | 6 refills | Status: DC

## 2022-06-30 MED ORDER — CIPROFLOXACIN HCL 250 MG PO TABS: 250.0000 mg | ORAL_TABLET | Freq: Two times a day (BID) | ORAL | 0 refills | Status: AC

## 2022-06-30 NOTE — Patient Outreach (Signed)
  Care Coordination   Follow Up Visit Note   06/30/2022 Name: Katherine [name] MRN: [id number] DOB: [birth year]  Katherine [name] is a 66 y.o. year old female who sees [name], MD for primary care. I spoke with  Katherine [name] by phone today.  What matters to the patients health and wellness today?  Katherine [name] reports she no longer needs a dermatologist. She reports primary care if referral her to a Psychologist, sport and exercise. She states she lost her new Insurance card and has been looking for it for a week. She also reports she has been in contact with primary care provider regarding a UTI. Katherine [name] states she is not sure if she received resources sent by care guide in October. She reports she moved into the house with her brother and that he had a past due bill that he is unable to pay. She states he is unable to work now due to illness and now they she has to pay the bill. She reports she owes more than $1000. She reports her income is $1300/month and the rent is $700/month in addition to other bills.   Goals Addressed             This Visit's Progress    Community resource needs       Care Coordination Interventions: Encouraged patient to [employer] company to follow up to see if there is an Magazine features editor she is eligible(patient has Medicaid) Encouraged patient to find or contact her insurance agent to obtain another Insurance card to see if she has a benefit with her insurance provider to Buyer, retail with BSW, [employer] regarding re-referral to to follow up on patient's utility needs Reviewed upcoming appointments with patient Confirmed with patient that she is aware that prescription sent into pharmacy today and that someone will pick the prescription up for patient Continue to attend provider visits as scheduled/recommended Contact provider if condition worsens or does not improve       SDOH assessments and interventions completed:   Yes  Care Coordination Interventions Activated:  Yes  Care Coordination Interventions:  Yes, provided   Follow up plan: Follow up call scheduled for 07/30/22    Encounter Outcome:  Pt. Visit Completed   Katherine Silversmith, RN, MSN, BSN, [place] Coordinator [phone]

## 2022-06-30 NOTE — Telephone Encounter (Signed)
Patient called back - please return her call.

## 2022-06-30 NOTE — Telephone Encounter (Signed)
Pt is requesting a refill of Abx she stated her UTI is not gone. Please advise.

## 2022-06-30 NOTE — Telephone Encounter (Signed)
Patient called back and would like you to call her back again.  She said it is urgent.

## 2022-06-30 NOTE — Telephone Encounter (Signed)
Error

## 2022-06-30 NOTE — Patient Instructions (Signed)
Visit Information  Thank you for taking time to visit with me today. Please don't hesitate to contact me if I can be of assistance to you.   Following are the goals we discussed today:   Goals Addressed             This Visit's Progress    Community resource needs       Care Coordination Interventions: Encouraged patient to Musician company to follow up to see if there is an Magazine features editor she is eligible(patient has Medicaid) Encouraged patient to find or contact her insurance agent to obtain another Insurance card to see if she has a benefit with her insurance provider to Buyer, retail with BSW, [employer] regarding re-referral to to follow up on patient's utility needs Reviewed upcoming appointments with patient Confirmed with patient that she is aware that prescription sent into pharmacy today and that someone will pick the prescription up for patient Continue to attend provider visits as scheduled/recommended Contact provider if condition worsens or does not improve       Our next appointment is by telephone on 07/30/22 at 2:15 pm  Please call the care guide team at [phone] if you need to cancel or reschedule your appointment.   If you are experiencing a Mental Health or [place] or need someone to talk to, please call the Suicide and Crisis Lifeline: 988  Patient verbalizes understanding of instructions and care plan provided today and agrees to view in [place]. Active MyChart status and patient understanding of how to access instructions and care plan via MyChart confirmed with patient.     [name], RN, MSN, BSN, [place] Coordinator [phone]

## 2022-06-30 NOTE — Telephone Encounter (Signed)
Pt said that she does not want to leave home to do a culture stating that "I am very sick". I reiterated that the culture recommendation was from PCP to ensure that she is taking the correct Abx for the strain of bacteria that is causing the UTI. Pt stated that all she wanted is for Cipro to be called in because "If I get worse I could die". Please advise.

## 2022-07-01 ENCOUNTER — Telehealth: Payer: Self-pay | Admitting: *Deleted

## 2022-07-01 NOTE — Progress Notes (Signed)
  Care Coordination Note  07/01/2022 Name: Katherine [name] MRN: [id number] DOB: 08/19/1955  Katherine [name] is a 66 y.o. year old female who is a primary care patient of [name], MD and is actively engaged with the care management team. I reached out to [name] by phone today to assist with scheduling an initial visit with the BSW  Follow up plan: Telephone appointment with care management team member scheduled for: 07/03/2022  Julian Hy, [place] Direct Dial: [phone]

## 2022-07-02 NOTE — Telephone Encounter (Signed)
Returned pt's call, LVM.

## 2022-07-03 ENCOUNTER — Ambulatory Visit: Payer: Self-pay | Admitting: Licensed Clinical Social Worker

## 2022-07-03 NOTE — Patient Outreach (Signed)
  Care Coordination   07/03/2022 Name: Katherine [name] MRN: [id number] DOB: 06/17/1956   Care Coordination Outreach Attempts:  An unsuccessful telephone outreach was attempted today to offer the patient information about available care coordination services as a benefit of their health plan.   Follow Up Plan:  Additional outreach attempts will be made to offer the patient care coordination information and services.   Encounter Outcome:  No Answer  Care Coordination Interventions Activated:  No   Care Coordination Interventions:  No, not indicated    Milus Height, [place]  Social Worker [facility] Care Management  [phone]

## 2022-07-06 ENCOUNTER — Ambulatory Visit: Payer: Medicare Other | Admitting: Internal Medicine

## 2022-07-09 ENCOUNTER — Other Ambulatory Visit: Payer: Self-pay | Admitting: Cardiology

## 2022-07-10 ENCOUNTER — Other Ambulatory Visit: Payer: Self-pay | Admitting: Emergency Medicine

## 2022-07-10 ENCOUNTER — Other Ambulatory Visit: Payer: Self-pay | Admitting: Internal Medicine

## 2022-07-13 ENCOUNTER — Other Ambulatory Visit: Payer: Self-pay | Admitting: Internal Medicine

## 2022-07-14 ENCOUNTER — Telehealth: Payer: Self-pay | Admitting: *Deleted

## 2022-07-14 NOTE — Progress Notes (Signed)
  Care Coordination Note  07/14/2022 Name: Katherine [name] MRN: [id number] DOB: 04/26/1956  Katherine [name] is a 66 y.o. year old female who is a primary care patient of [name], MD and is actively engaged with the care management team. I reached out to [name] by phone today to assist with re-scheduling an initial visit with the BSW  Follow up plan: Telephone appointment with care management team member scheduled for: 07/20/2022  Julian Hy, [place] Direct Dial: [phone]

## 2022-07-14 NOTE — Progress Notes (Signed)
  Care Coordination Note  07/14/2022 Name: Katherine [name] MRN: [id number] DOB: 09/15/1955  Katherine [name] is a 66 y.o. year old female who is a primary care patient of [name], MD and is actively engaged with the care management team. I reached out to [name] by phone today to assist with re-scheduling an initial visit with the BSW  Follow up plan: Unsuccessful telephone outreach attempt made. A HIPAA compliant phone message was left for the patient providing contact information and requesting a return call.   Julian Hy, [place] Direct Dial: [phone]

## 2022-07-16 ENCOUNTER — Telehealth: Payer: Self-pay | Admitting: Emergency Medicine

## 2022-07-16 NOTE — Telephone Encounter (Signed)
Pt will need an appt prior to [place] being able to refill her albuterol inhaler as it has been over a year since pt was last seen.  Attempted to call pt but unable to reach and unable to leave VM as after the phone rang three times, received a message stating that the call could not be completed as dialed. Will try to call back later.

## 2022-07-16 NOTE — Telephone Encounter (Signed)
Patient called to request a refill for her Albuterol puffer.  Please advise and call patient to discuss at [phone]

## 2022-07-20 ENCOUNTER — Ambulatory Visit: Payer: Self-pay | Admitting: Licensed Clinical Social Worker

## 2022-07-20 NOTE — Patient Instructions (Signed)
Visit Information  Thank you for taking time to visit with me today. Please don't hesitate to contact me if I can be of assistance to you.   Following are the goals we discussed today:   Goals Addressed               This Visit's Progress     Care Cooridnation activities- Financial Assistance (pt-stated)        Patient behind on utilities/ Rent. An agency called [employer] is willing to assist patient. Marlowe Kays at [place] is the point of contact. SW recommended patient to contact Marlowe Kays on Friday (12/08) if she hasn't heard  back from agency.   SW advised patient to visit DSS to apply for LIEAP/CIP. Patient agreed. SW informed patient she will no longer be the SW after the following week. SW will follow up with patient within the next 7 days. If continuous follow up is needed, SW will refer to an ongoing BSW.           Patient verbalizes understanding of instructions and care plan provided today and agrees to view in [place]. Active MyChart status and patient understanding of how to access instructions and care plan via MyChart confirmed with patient.     The care management team will reach out to the patient again over the next 7 days.   Milus Height, Arita Miss, MSW, [place]  Social Worker [facility] Care Management  [phone]

## 2022-07-20 NOTE — Patient Outreach (Signed)
  Care Coordination   Initial Visit Note   07/20/2022 Name: Katherine [name] MRN: [id number] DOB: 17-Apr-1956  Katherine [name] is a 66 y.o. year old female who sees [name], MD for primary care. I spoke with  [name] by phone today.  What matters to the patients health and wellness today?  Utilities/ Rent    Goals Addressed               This Visit's Progress     Care Cooridnation activities- Financial Assistance (pt-stated)        Patient behind on utilities/ Rent. An agency called [employer] [facility] is willing to assist patient. Marlowe Kays at [facility] is the point of contact. SW recommended patient to contact Marlowe Kays on Friday (12/08) if she hasn't heard  back from agency.   SW advised patient to visit DSS to apply for LIEAP/CIP. Patient agreed. SW informed patient she will no longer be the SW after the following week. SW will follow up with patient within the next 7 days. If continuous follow up is needed, SW will refer to an ongoing BSW.         SDOH assessments and interventions completed:  Yes  SDOH Interventions Today    Flowsheet Row Most Recent Value  SDOH Interventions   Utilities Interventions Other (Comment)        Care Coordination Interventions:  Yes, provided   Follow up plan: Follow up call scheduled for 07/27/22    Encounter Outcome:  Pt. Visit Completed

## 2022-07-23 ENCOUNTER — Institutional Professional Consult (permissible substitution): Payer: Medicare Other | Admitting: Plastic Surgery

## 2022-07-27 ENCOUNTER — Ambulatory Visit: Payer: Self-pay | Admitting: Licensed Clinical Social Worker

## 2022-07-27 NOTE — Telephone Encounter (Signed)
ATC patient but the call would not go through. Will close encounter.

## 2022-07-27 NOTE — Patient Outreach (Signed)
  Care Coordination   07/27/2022 Name: Katherine [name] MRN: [id number] DOB: [birth year]   Care Coordination Outreach Attempts:  An unsuccessful telephone outreach was attempted today to offer the patient information about available care coordination services as a benefit of their health plan.   Follow Up Plan:  No further outreach attempts will be made at this time. We have been unable to contact the patient to offer or enroll patient in care coordination services  Encounter Outcome:  No Answer   Care Coordination Interventions:  No, not indicated    Milus Height, Arita Miss, MSW, [place] Social Worker [facility] Care Management  [phone]

## 2022-07-29 ENCOUNTER — Other Ambulatory Visit: Payer: Self-pay | Admitting: Internal Medicine

## 2022-07-29 ENCOUNTER — Other Ambulatory Visit: Payer: Self-pay | Admitting: Cardiology

## 2022-07-29 ENCOUNTER — Telehealth: Payer: Self-pay | Admitting: Internal Medicine

## 2022-07-29 DIAGNOSIS — M47816 Spondylosis without myelopathy or radiculopathy, lumbar region: Secondary | ICD-10-CM

## 2022-07-29 DIAGNOSIS — Z515 Encounter for palliative care: Secondary | ICD-10-CM

## 2022-07-29 DIAGNOSIS — M1711 Unilateral primary osteoarthritis, right knee: Secondary | ICD-10-CM

## 2022-07-29 DIAGNOSIS — M4726 Other spondylosis with radiculopathy, lumbar region: Secondary | ICD-10-CM

## 2022-07-29 MED ORDER — OXYCODONE HCL 10 MG PO TABS: 10.0000 mg | ORAL_TABLET | Freq: Three times a day (TID) | ORAL | 0 refills | Status: DC | PRN

## 2022-07-29 NOTE — Telephone Encounter (Signed)
Patient called and said she needs her oxycodone '10mg'$  refilled. She would like a call back at [phone].

## 2022-07-30 ENCOUNTER — Telehealth: Payer: Self-pay | Admitting: Emergency Medicine

## 2022-07-30 ENCOUNTER — Ambulatory Visit: Payer: Self-pay

## 2022-07-30 MED ORDER — ALBUTEROL SULFATE HFA 108 (90 BASE) MCG/ACT IN AERS: INHALATION_SPRAY | RESPIRATORY_TRACT | 4 refills | Status: DC

## 2022-07-30 NOTE — Patient Instructions (Signed)
Visit Information  Thank you for taking time to visit with me today. Please don't hesitate to contact me if I can be of assistance to you.   Following are the goals we discussed today:   Goals Addressed             This Visit's Progress    Community resource needs       Care Coordination Interventions: Confirmed correct contact number for patient. Encouraged patient to contact providers that are not on EPIC to update her contact number Reinforced with patient that pulmonology office was trying to reach her regarding the need to schedule a follow up with pulmonologist. [facility] encouraged patient to contact pulmonology office and schedule appointment.  Collaboration with BSW to follow up on utility bill concerns  Continue to attend provider visits as scheduled/recommended       Our next appointment is by telephone on 09/01/21 at 1:30 pm  Please call the care guide team at [phone] if you need to cancel or reschedule your appointment.   If you are experiencing a Mental Health or [place] or need someone to talk to, please call the Suicide and Crisis Lifeline: 988  Patient verbalizes understanding of instructions and care plan provided today and agrees to view in [place]. Active MyChart status and patient understanding of how to access instructions and care plan via MyChart confirmed with patient.     [name], RN, MSN, BSN, [place]  Coordinator [phone]

## 2022-07-30 NOTE — Patient Outreach (Signed)
  Care Coordination   Follow Up Visit Note   07/30/2022 Name: Katherine [name] MRN: [id number] DOB: 08/01/1956  Katherine [name] is a 66 y.o. year old female who sees Katherine Lima, MD for primary care. I spoke with  Katherine [name] by phone today.  What matters to the patients health and wellness today? Per review of chart, noted contact number in Epic was not correct. Katherine [name] states she has changed her number and the correct number is [phone]. Katherine [name] reports her electric bill has been decreased from $1000 to $400, but she continues to need assistance with paying off this bill. She states she called Katherine [name], as instructed by social worker, Katherine [name], but states she has not been able to make contact with her.  Katherine [name] is unclear what the other resource was advised to her.      Goals Addressed             This Visit's Progress    Community resource needs       Care Coordination Interventions: Confirmed correct contact number for patient. Encouraged patient to contact providers that are not on EPIC to update her contact number Reinforced with patient that pulmonology office was trying to reach her regarding the need to schedule a follow up with pulmonologist. [facility] encouraged patient to contact pulmonology office and schedule appointment.  Collaboration with BSW to follow up on utility bill concerns  Continue to attend provider visits as scheduled/recommended       SDOH assessments and interventions completed:  No  Care Coordination Interventions:  Yes, provided   Follow up plan: Follow up call scheduled for 09/01/21    Encounter Outcome:  Pt. Visit Completed   [name], RN, MSN, BSN, [place] Coordinator [phone]

## 2022-07-30 NOTE — Telephone Encounter (Signed)
Called and spoke to patient and verified that she is needing refills of her albuterol inhaler and the pharmacy. Told her to make sure she keeps follow up with Dr Lamonte Sakai for January. Nothing further needed

## 2022-07-31 ENCOUNTER — Other Ambulatory Visit: Payer: Self-pay | Admitting: Internal Medicine

## 2022-07-31 ENCOUNTER — Other Ambulatory Visit: Payer: Self-pay | Admitting: Physical Medicine & Rehabilitation

## 2022-07-31 ENCOUNTER — Ambulatory Visit: Payer: Self-pay

## 2022-07-31 DIAGNOSIS — I10 Essential (primary) hypertension: Secondary | ICD-10-CM

## 2022-07-31 DIAGNOSIS — Z9861 Coronary angioplasty status: Secondary | ICD-10-CM

## 2022-07-31 DIAGNOSIS — I152 Hypertension secondary to endocrine disorders: Secondary | ICD-10-CM

## 2022-07-31 NOTE — Patient Instructions (Signed)
Visit Information  Thank you for taking time to visit with me today. Please don't hesitate to contact me if I can be of assistance to you.   Following are the goals we discussed today:   Goals Addressed               This Visit's Progress     Patient Stated     COMPLETED: Care Cooridnation activities- Financial Assistance (pt-stated)        Care Coordination Interventions: Collaboration with [name] RN Care Manager who requests SW outreach to assist with utility payment Performed chart review to note patient has been in contact previously with colleague Milus Height regarding utility resource needs Discussed the patient has been in contact with Marlowe Kays from [place] this week and expected to hear back from her today regarding the $400 owed to [employer] Patient reports she moved in with her brother who owed over one thousand dollars to [employer]. The amount owed is now $400 and patient is concerned she is unable to pay this debt off. Patient reports the power bill has since been switched to her name and her brother no longer lives in the home. She feels she should not have to pay off his debt Offered to place a referral to legal aid - patient declined Determined the patient is working with the [employer] who is investigating if this balance is the patients responsibility or not Patient reports she does not have a disconnect notice at this time Education provided on resources that may be able to assist with utility bill advising that most resources require there to be a disconnect notice in order to provide assistance Encouraged the patient to contact Marlowe Kays with [facility] for an update on payment assistance Advised the patient she may want to contact DSS to inquire if she qualifies for any assistance  Education on Medicare/Medicaid plans that sometimes offer assistance with utility costs encouraging the patient to speak with DSS to determine if she is eligible for any of  these specific plans Collaboration with RN Care Manager to advise of interventions and plan for SW to sign off. Patient will remain in contact with [place] from [place]. Patient will also contact DSS as outlined above         If you are experiencing a Mental Health or [place] or need someone to talk to, please call 1-800-273-TALK (toll free, 24 hour hotline) go to [facility] [place] [phone]) call 911  Patient verbalizes understanding of instructions and care plan provided today and agrees to view in [place]. Active MyChart status and patient understanding of how to access instructions and care plan via MyChart confirmed with patient.     No further follow up required: Please contact me as needed.  Daneen Schick, BSW, CDP Social Worker, Certified Dementia Practitioner [place] [facility]  Care Coordination [phone]

## 2022-07-31 NOTE — Patient Outreach (Signed)
  Care Coordination   Follow Up Visit Note   07/31/2022 Name: Katherine [name] MRN: [id number] DOB: 07-04-1956  Katherine [name] is a 66 y.o. year old female who sees Katherine Lima, MD for primary care. I spoke with  Katherine [name] by phone today.  What matters to the patients health and wellness today?  Resource Linking    Goals Addressed               This Visit's Progress     Patient Stated     COMPLETED: Care Cooridnation activities- Financial Assistance (pt-stated)        Care Coordination Interventions: Collaboration with Katherine Silversmith RN Care Manager who requests SW outreach to assist with utility payment Performed chart review to note patient has been in contact previously with colleague Katherine [name] regarding utility resource needs Discussed the patient has been in contact with Katherine [name] from [place] this week and expected to hear back from her today regarding the $400 owed to Katherine [name] Patient reports she moved in with her brother who owed over one thousand dollars to Katherine [name]. The amount owed is now $400 and patient is concerned she is unable to pay this debt off. Patient reports the power bill has since been switched to her name and her brother no longer lives in the home. She feels she should not have to pay off his debt Offered to place a referral to legal aid - patient declined Determined the patient is working with the [employer] who is investigating if this balance is the patients responsibility or not Patient reports she does not have a disconnect notice at this time Education provided on resources that may be able to assist with utility bill advising that most resources require there to be a disconnect notice in order to provide assistance Encouraged the patient to contact Katherine [name] with [facility] for an update on payment assistance Advised the patient she may want to contact Katherine [name] to inquire if she qualifies for any assistance  Education on  Medicare/Medicaid plans that sometimes offer assistance with utility costs encouraging the patient to speak with Katherine [name] to determine if she is eligible for any of these specific plans Collaboration with RN Care Manager to advise of interventions and plan for SW to sign off. Patient will remain in contact with Katherine [name] from [place]. Patient will also contact Katherine [name] as outlined above         SDOH assessments and interventions completed:  No     Care Coordination Interventions:  Yes, provided   Follow up plan: No further intervention required.   Encounter Outcome:  Pt. Visit Completed   Katherine [name], BSW, CDP Social Worker, Certified Dementia Practitioner [place] [facility]  Care Coordination [phone]

## 2022-08-04 ENCOUNTER — Other Ambulatory Visit (HOSPITAL_COMMUNITY): Payer: Self-pay

## 2022-08-04 ENCOUNTER — Other Ambulatory Visit: Payer: Self-pay | Admitting: Internal Medicine

## 2022-08-05 ENCOUNTER — Other Ambulatory Visit: Payer: Self-pay | Admitting: Internal Medicine

## 2022-08-05 DIAGNOSIS — F411 Generalized anxiety disorder: Secondary | ICD-10-CM

## 2022-08-05 NOTE — Telephone Encounter (Signed)
Patient called to follow up because she is out of medication, She requested a call back

## 2022-08-10 ENCOUNTER — Other Ambulatory Visit: Payer: Self-pay | Admitting: Internal Medicine

## 2022-08-10 DIAGNOSIS — N182 Chronic kidney disease, stage 2 (mild): Secondary | ICD-10-CM

## 2022-08-11 ENCOUNTER — Ambulatory Visit: Payer: Medicare Other | Admitting: Internal Medicine

## 2022-08-18 IMAGING — DX DG CHEST 1V PORT
1 series · 1 of 1 positions shown · non-contrast
Comparison: None.

CLINICAL DATA: COVID positive with generalized weakness.

EXAM:
PORTABLE CHEST 1 VIEW

[chest ap]
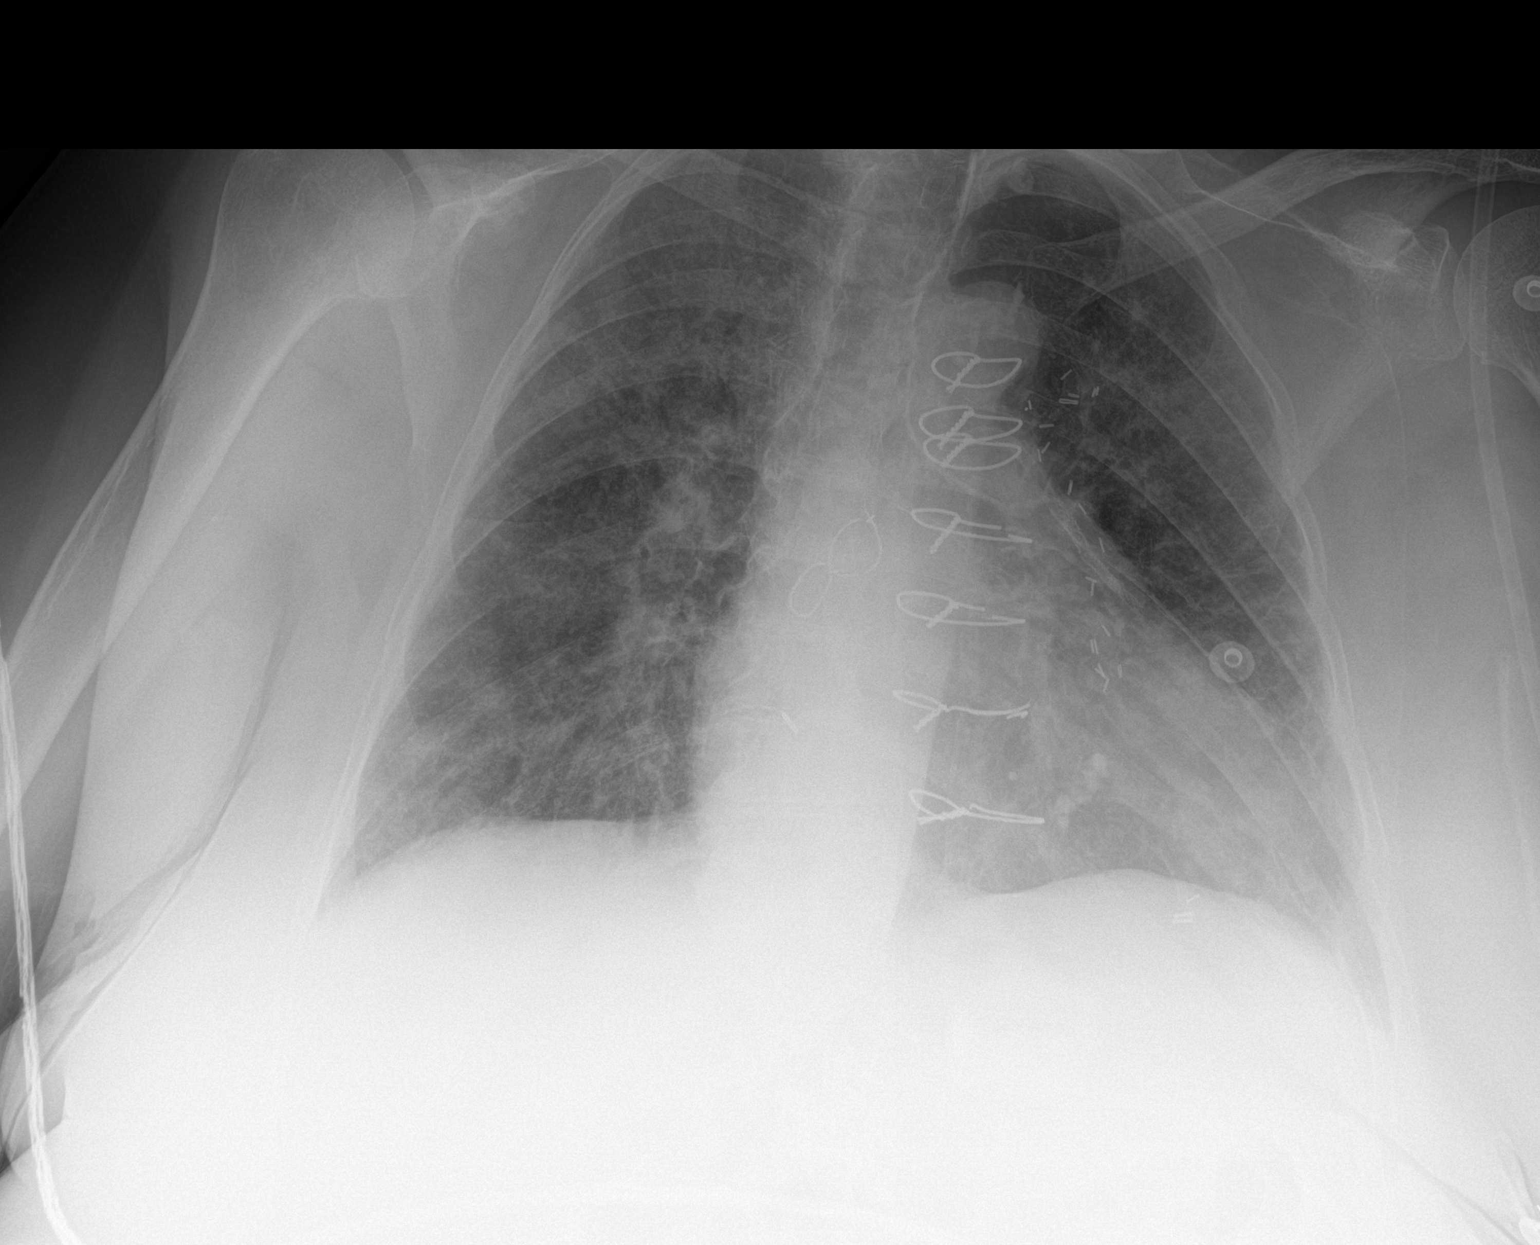

[1 of 1 positions shown; findings below may reference images not displayed]

FINDINGS: Multiple sternal wires and vascular clips are seen. Mild,
ill-defined areas of atelectasis and/or infiltrate are seen
throughout the right lung and periphery of the left lung base. There
is no evidence of a pleural effusion or pneumothorax. The cardiac
silhouette is mildly enlarged and unchanged in size. The visualized
skeletal structures are unremarkable.
IMPRESSION: 1. Evidence of prior median sternotomy/CABG.
2. Ill-defined areas of bilateral atelectasis and/or infiltrate.

## 2022-08-19 IMAGING — DX DG CHEST 1V PORT
2 series · 2 of 2 positions shown · non-contrast
Comparison: 08/04/2021

CLINICAL DATA: Weakness and shortness of breath.

EXAM:
PORTABLE CHEST 1 VIEW

[chest ap (1 of 2)]
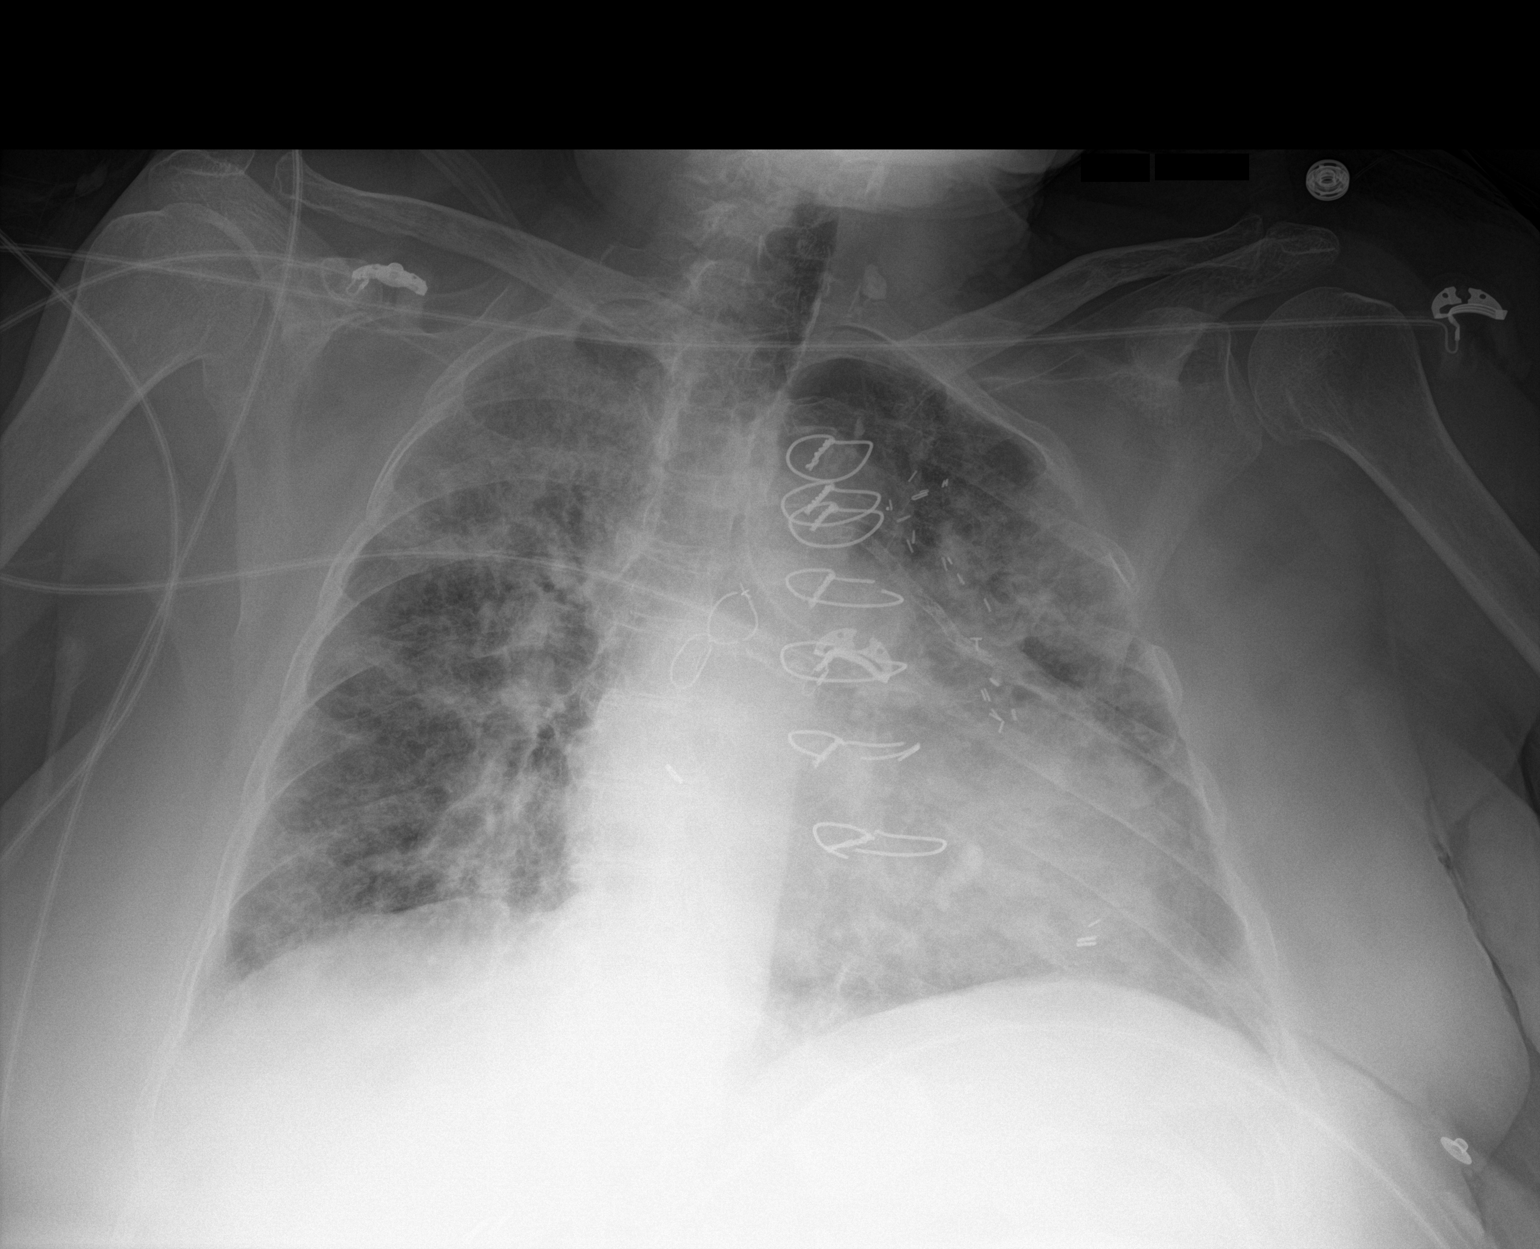

[chest ap (2 of 2)]
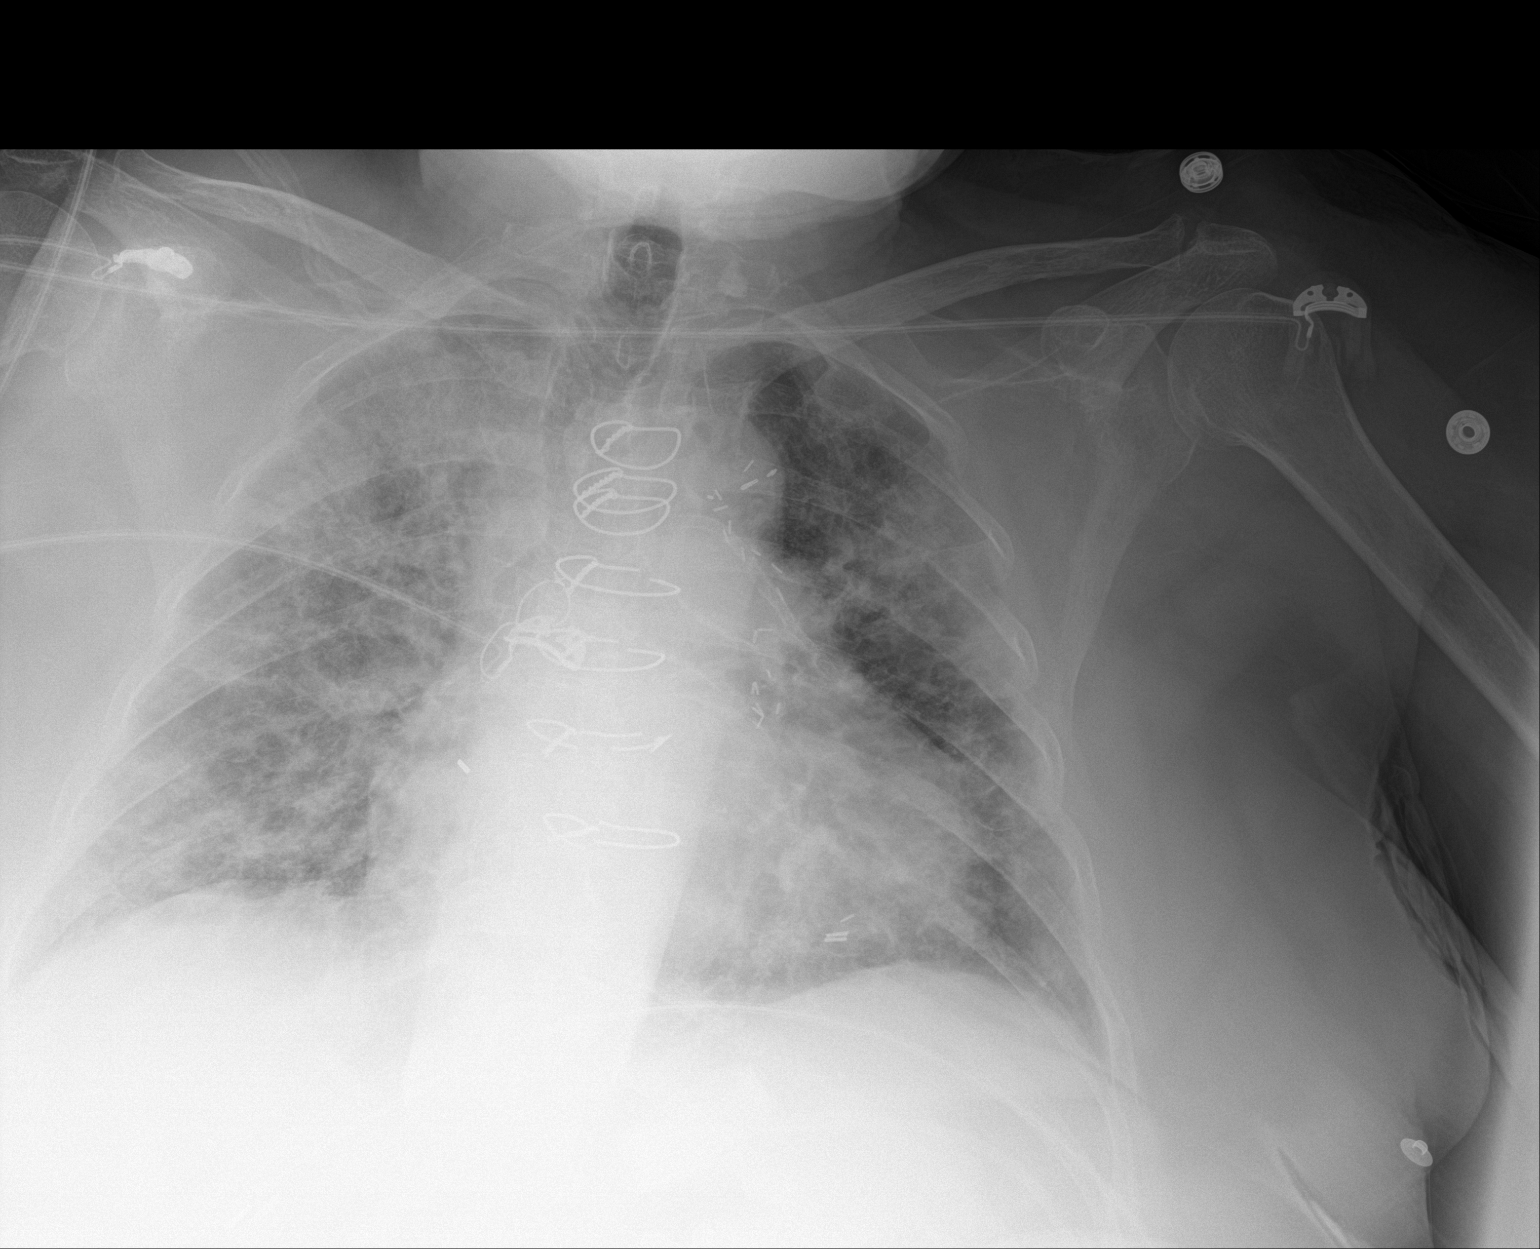

[2 of 2 positions shown; findings below may reference images not displayed]

FINDINGS: 4645 hours. Low volumes. The cardio pericardial silhouette is
enlarged. Patchy bilateral airspace disease is progressive in the
interval. No pleural effusion. The visualized bony structures of the
thorax show no acute abnormality. Telemetry leads overlie the chest.
IMPRESSION: Patchy bilateral airspace disease, progressive in the interval.

## 2022-08-19 IMAGING — CT CT ANGIO CHEST
2 of 7 series · 17 of 46 positions shown · IV contrast (APPLIED)
Comparison: None.

CLINICAL DATA: PE suspected, shortness of breath, COVID positive

EXAM:
CT ANGIOGRAPHY CHEST WITH CONTRAST
TECHNIQUE: Multidetector CT imaging of the chest was performed using the
standard protocol during bolus administration of intravenous
contrast. Multiplanar CT image reconstructions and MIPs were
obtained to evaluate the vascular anatomy.
CONTRAST:  75mL OMNIPAQUE IOHEXOL 350 MG/ML SOLN

[Series 5: thins · axial · 0.66mm/px · z∈[+1421,+1662]mm · 15 of 277 slices shown]
[im 18/277  lung]
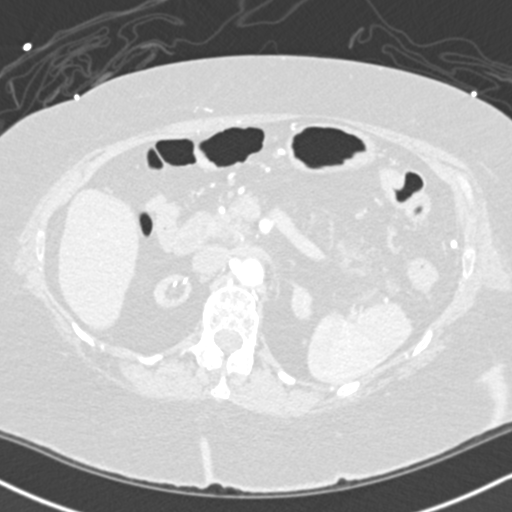
[im 35/277  soft-tissue]
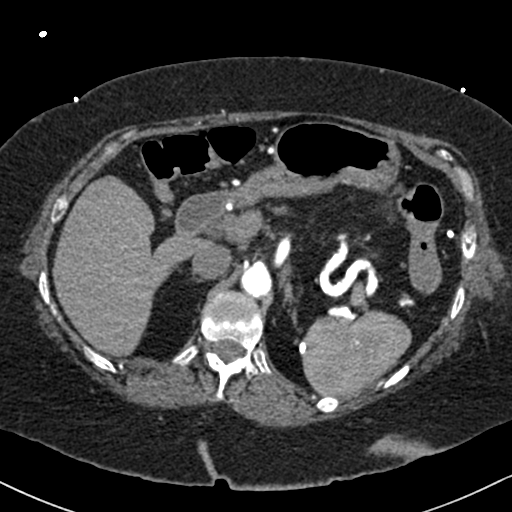
[im 52/277  lung]
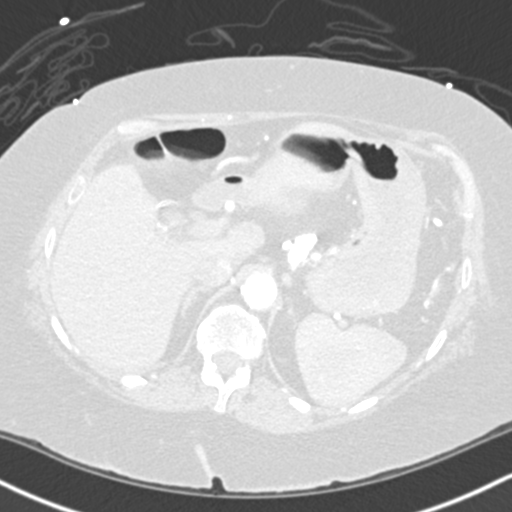
[im 70/277  soft-tissue]
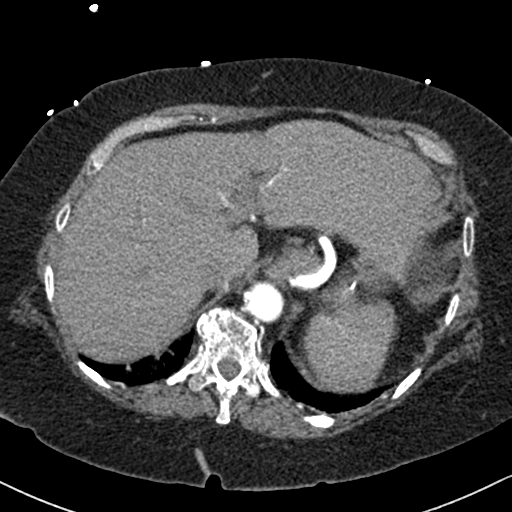
[im 87/277  lung]
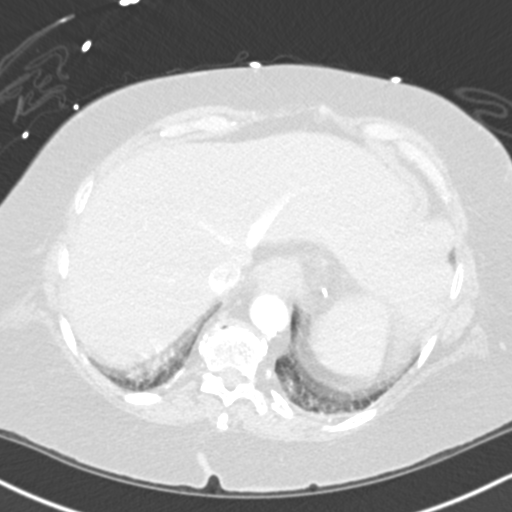
[im 104/277  soft-tissue]
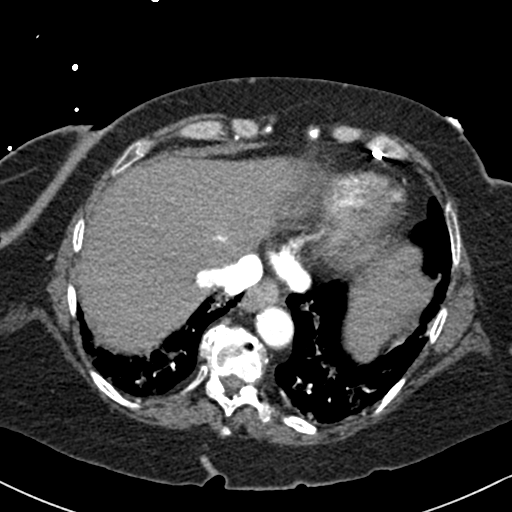
[im 121/277  lung]
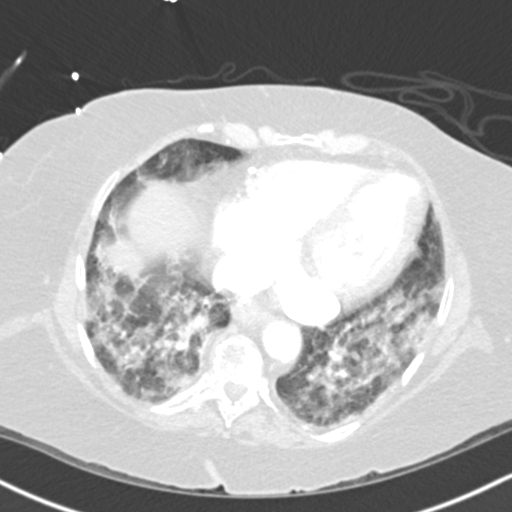
[im 139/277  soft-tissue]
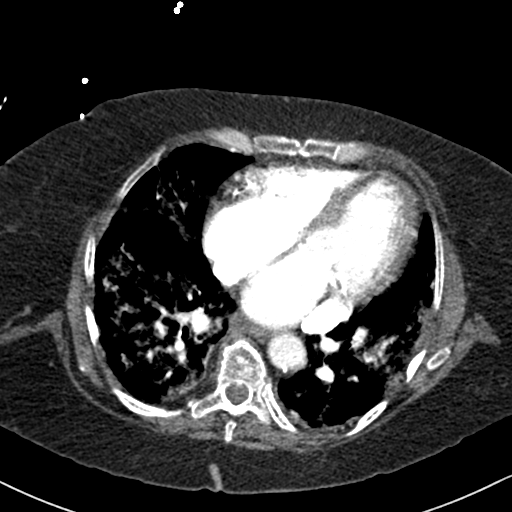
[im 156/277  lung]
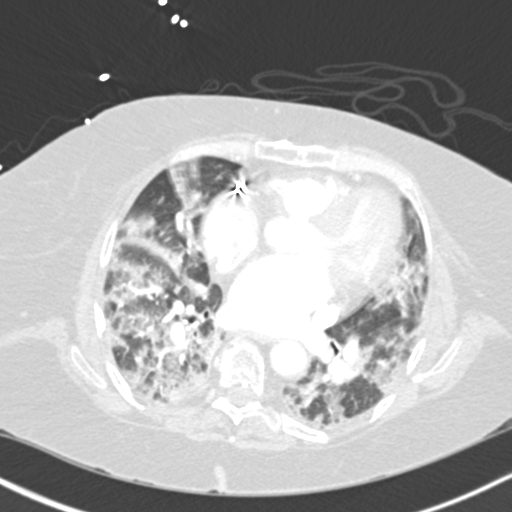
[im 173/277  soft-tissue]
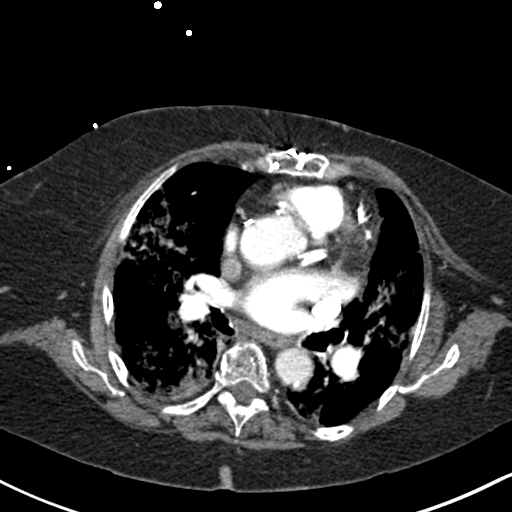
[im 190/277  lung]
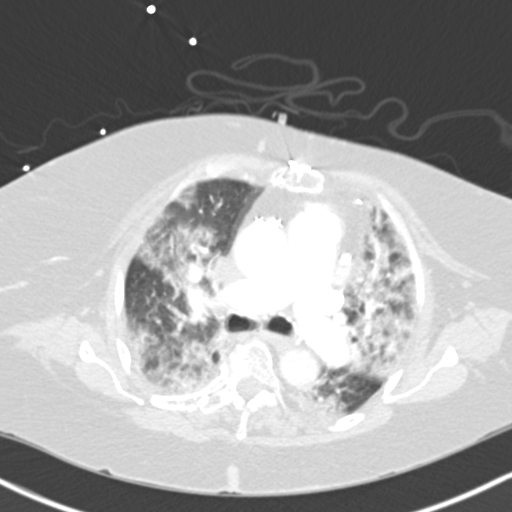
[im 208/277  soft-tissue]
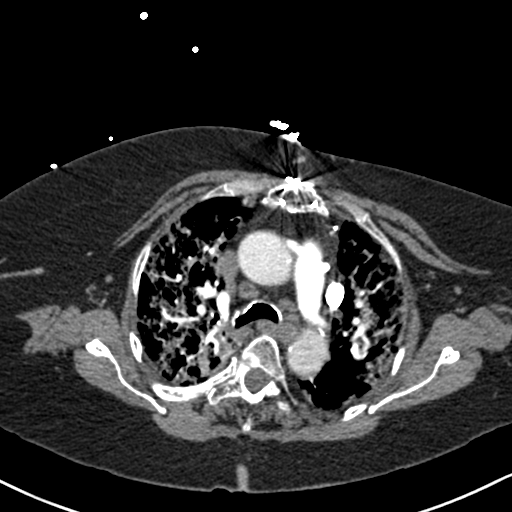
[im 225/277  lung]
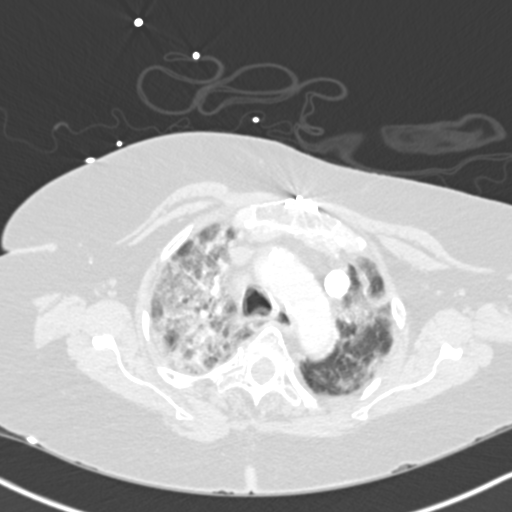
[im 242/277  soft-tissue]
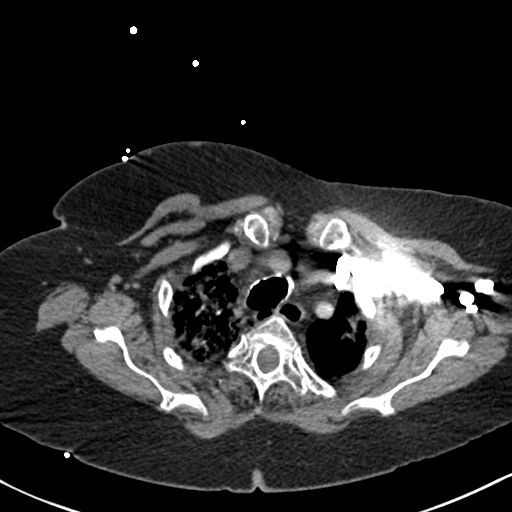
[im 259/277  lung]
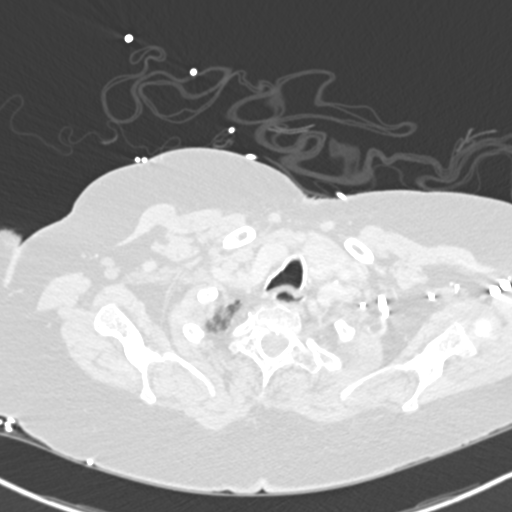

[Series 7: coronal mpr · coronal · 0.57mm/px · 2 of 92 slices shown]
[im 31/92  soft-tissue]
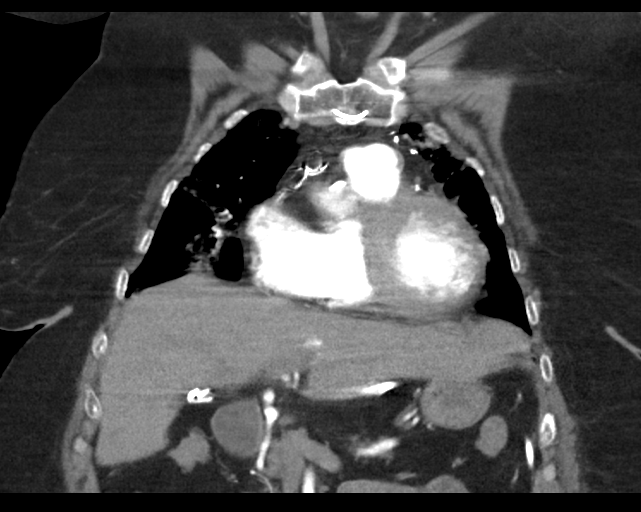
[im 61/92  soft-tissue]
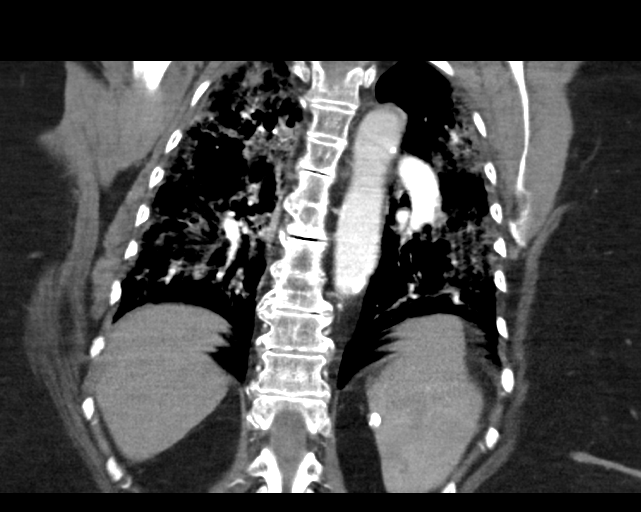

[17 of 46 positions shown; findings below may reference images not displayed]

FINDINGS: Cardiovascular: Satisfactory opacification of the pulmonary arteries
to the segmental level. Examination for pulmonary embolism is
limited by breath motion artifact. Within this limitation, no
evidence of pulmonary embolism through the segmental pulmonary
arterial level. Cardiomegaly. Extensive 3 vessel coronary artery
calcifications status post median sternotomy and CABG. No
pericardial effusion. Aortic atherosclerosis.

Mediastinum/Nodes: No enlarged mediastinal, hilar, or axillary lymph
nodes. Thyroid gland, trachea, and esophagus demonstrate no
significant findings.

Lungs/Pleura: Very extensive heterogeneous and ground-glass airspace
opacity throughout the lungs. No pleural effusion or pneumothorax.

Upper Abdomen: No acute abnormality. Status post cholecystectomy.
Adjustable gastric lap band. Very atrophic kidneys.

Musculoskeletal: No chest wall abnormality. No acute or significant
osseous findings.

Review of the MIP images confirms the above findings.
IMPRESSION: 1. Examination for pulmonary embolism is limited by breath motion
artifact. Within this limitation, no evidence of pulmonary embolism
through the segmental pulmonary arterial level.
2. Very extensive heterogeneous and ground-glass airspace opacity
throughout the lungs, consistent with [id number]-OS airspace disease.
3. Cardiomegaly and coronary artery disease.

Aortic Atherosclerosis ([id number]-S6M.M).

## 2022-08-21 IMAGING — DX DG CHEST 1V PORT
1 series · 1 of 1 positions shown · non-contrast
Comparison: 08/05/2021

CLINICAL DATA: Shortness of breath, L9JGO-H1 positivity, initial
encounter

EXAM:
PORTABLE CHEST 1 VIEW

[chest ap]
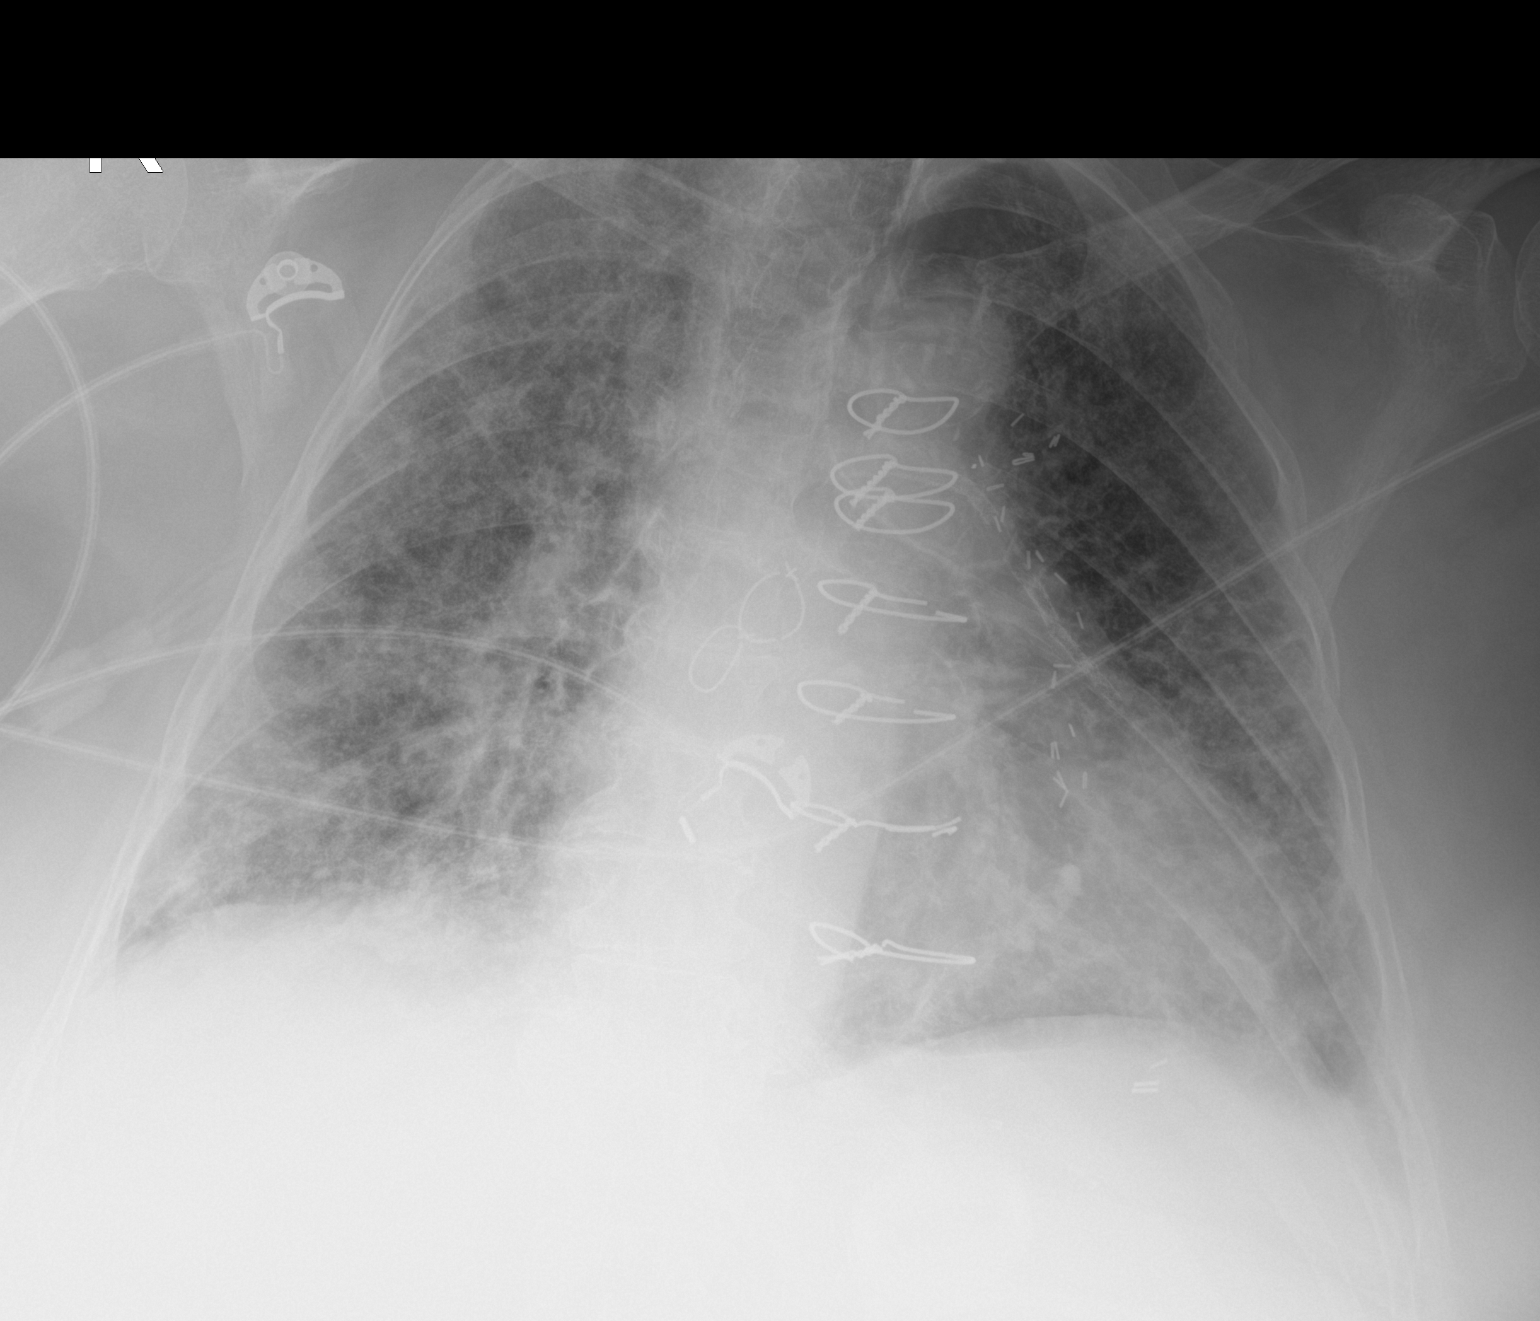

[1 of 1 positions shown; findings below may reference images not displayed]

FINDINGS: Cardiac shadow is stable. Lungs are well aerated bilaterally. Patchy
airspace opacity is noted slightly improved when compare with the
prior exam. No new focal abnormality is noted.
IMPRESSION: Patchy airspace opacities improved in the interval from the prior
study.

## 2022-08-23 IMAGING — DX DG CHEST 1V PORT
1 series · 1 of 1 positions shown · non-contrast
Comparison: 10/08/2020

CLINICAL DATA: MRSA pneumonia

EXAM:
PORTABLE CHEST 1 VIEW

[chest ap]
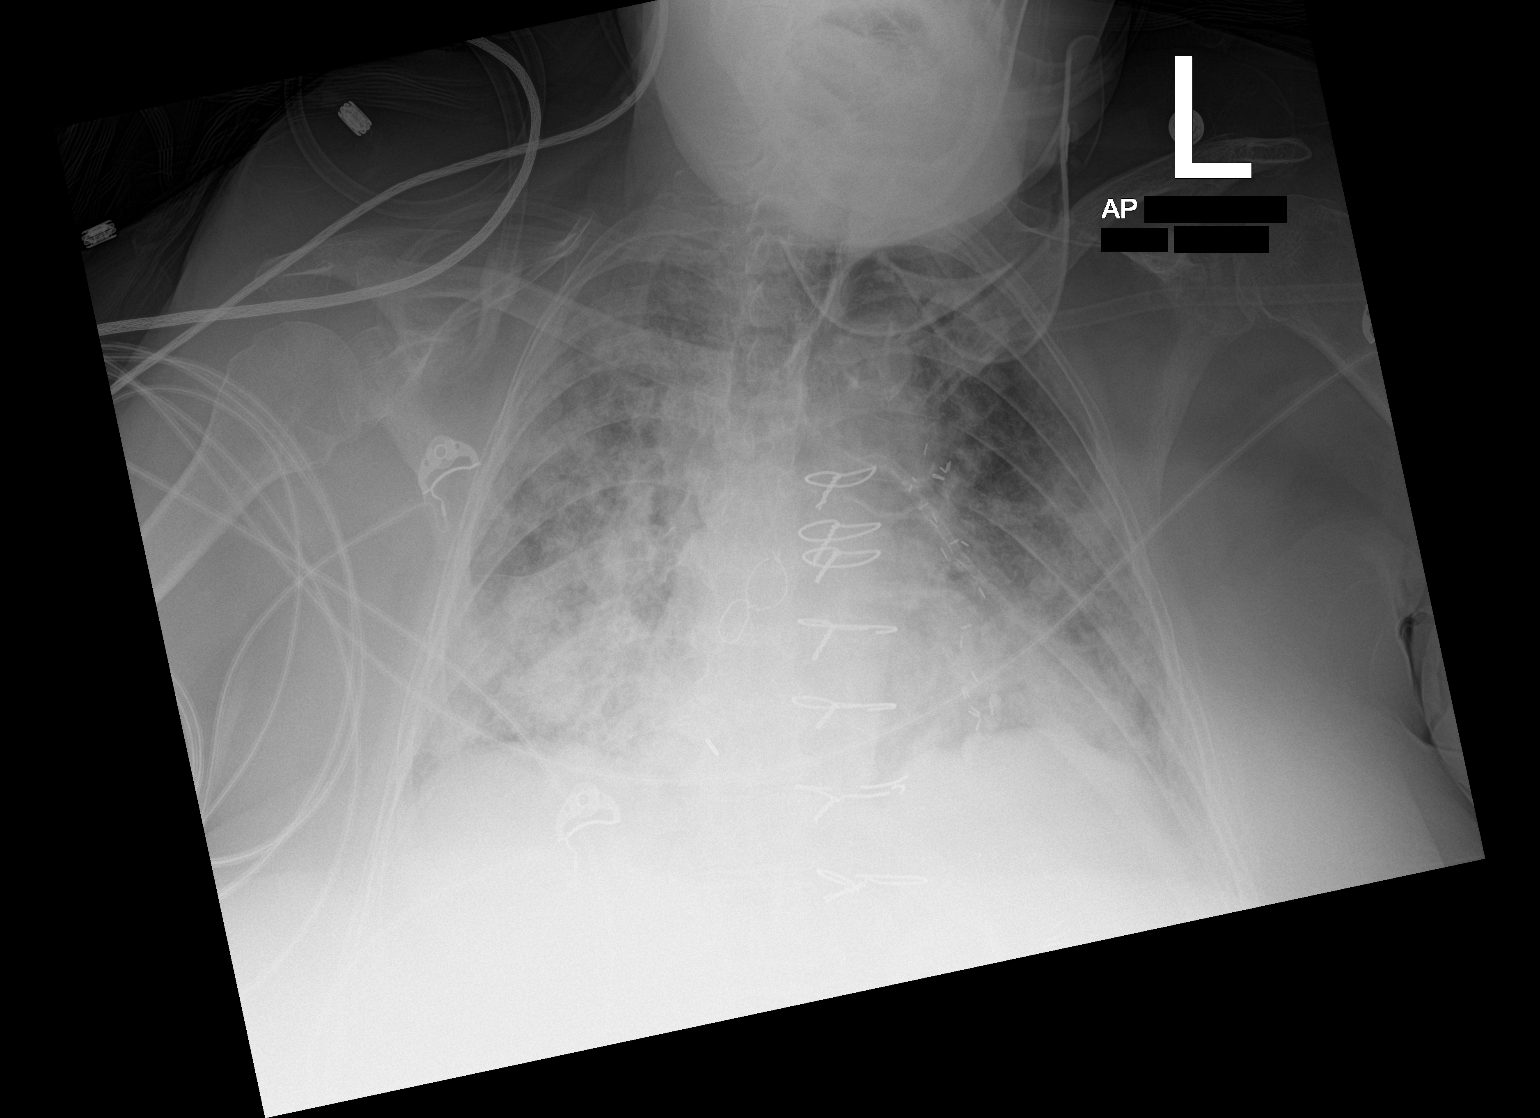

[1 of 1 positions shown; findings below may reference images not displayed]

FINDINGS: Changes of CABG. Cardiomegaly. Severe diffuse bilateral airspace
disease, worsening since prior study. No effusions. No acute bony
abnormality.
IMPRESSION: Severe diffuse bilateral airspace disease, worsening since prior
study.

## 2022-08-25 IMAGING — DX DG CHEST 1V PORT
1 series · 1 of 1 positions shown · non-contrast
Comparison: 08/09/2021

CLINICAL DATA: Respiratory failure

EXAM:
PORTABLE CHEST 1 VIEW

[chest ap]
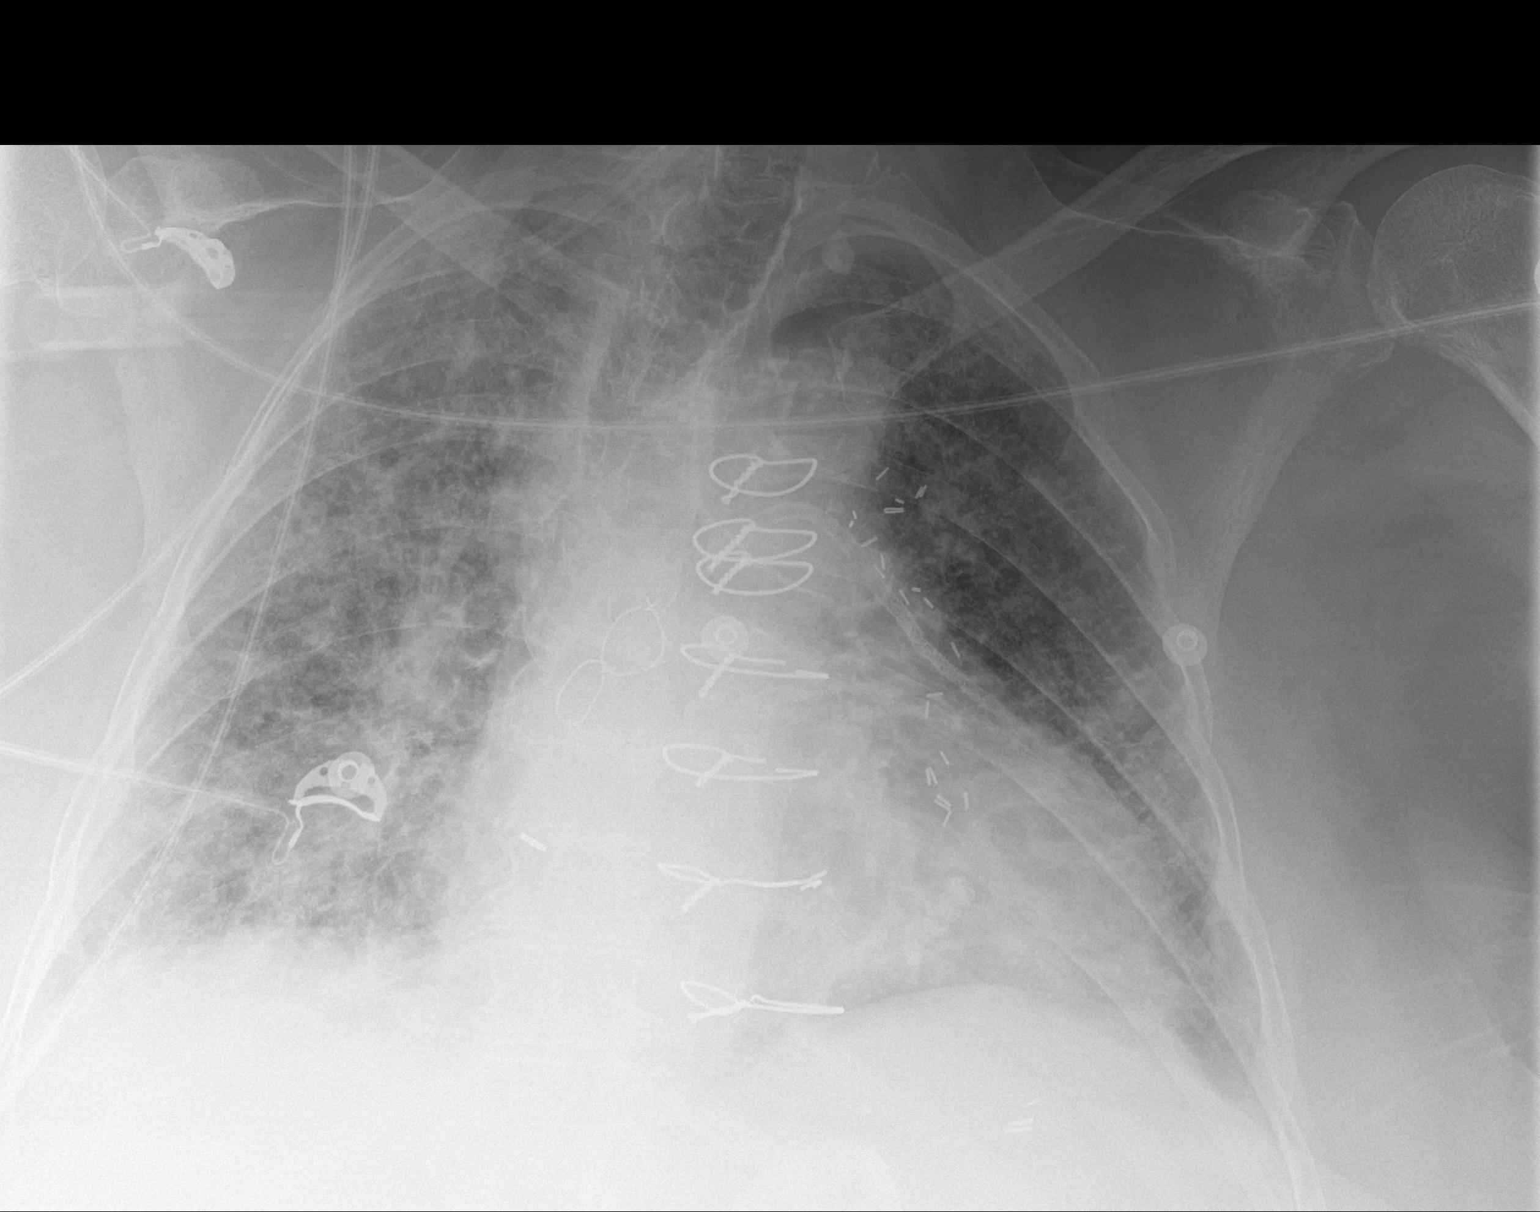

[1 of 1 positions shown; findings below may reference images not displayed]

FINDINGS: Multifocal patchy opacities with relative sparing of the left upper
lobe, reflecting multifocal pneumonia, grossly unchanged. No pleural
effusion or pneumothorax.

Mild cardiomegaly.  Postsurgical changes related to prior CABG.

Median sternotomy.
IMPRESSION: Multifocal pneumonia, grossly unchanged.

## 2022-08-26 IMAGING — DX DG CHEST 1V PORT
1 series · 1 of 1 positions shown · non-contrast
Comparison: Radiographs 08/11/2021, 08/09/2021 and 08/07/2021. CTA
08/05/2021.

CLINICAL DATA: PICC line placement.

EXAM:
PORTABLE CHEST 1 VIEW

[chest ap]
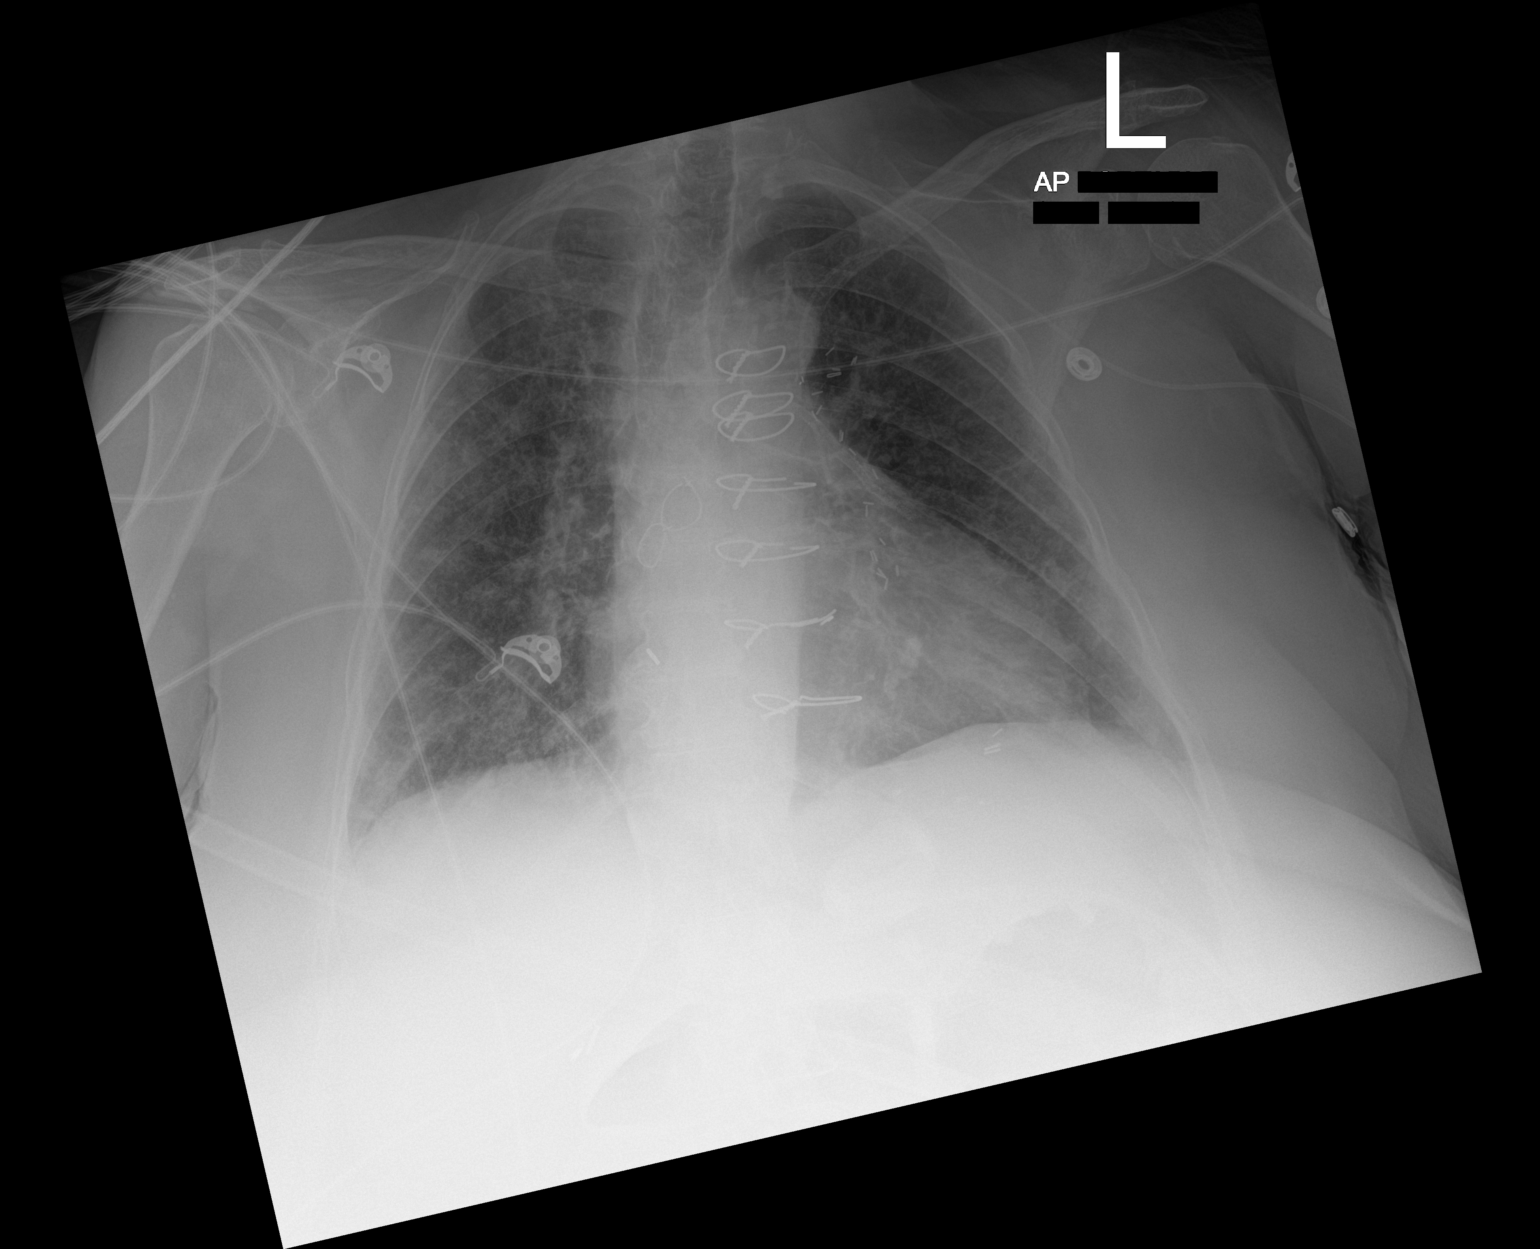

[1 of 1 positions shown; findings below may reference images not displayed]

FINDINGS: 1882 hours. Left arm PICC projects over the left perihilar region,
which in correlation with recent CTA is at the level of a persistent
left-sided SVC. Stable mild cardiomegaly post median sternotomy and
CABG. The diffuse bilateral pulmonary opacities demonstrate further
partial clearing. There is no confluent airspace opacity,
pneumothorax or significant pleural effusion. The bones appear
unchanged.
IMPRESSION: 1. Tip of the left arm PICC projects over the mid aspect of a
persistent left-sided SVC seen on prior CT.
2. Improving bilateral airspace opacities consistent with improving
[id number] infection.

## 2022-08-28 IMAGING — DX DG CHEST 1V PORT
1 series · 1 of 1 positions shown · non-contrast
Comparison: 08/12/2021

CLINICAL DATA: COVID positive.

EXAM:
PORTABLE CHEST 1 VIEW

[chest ap]
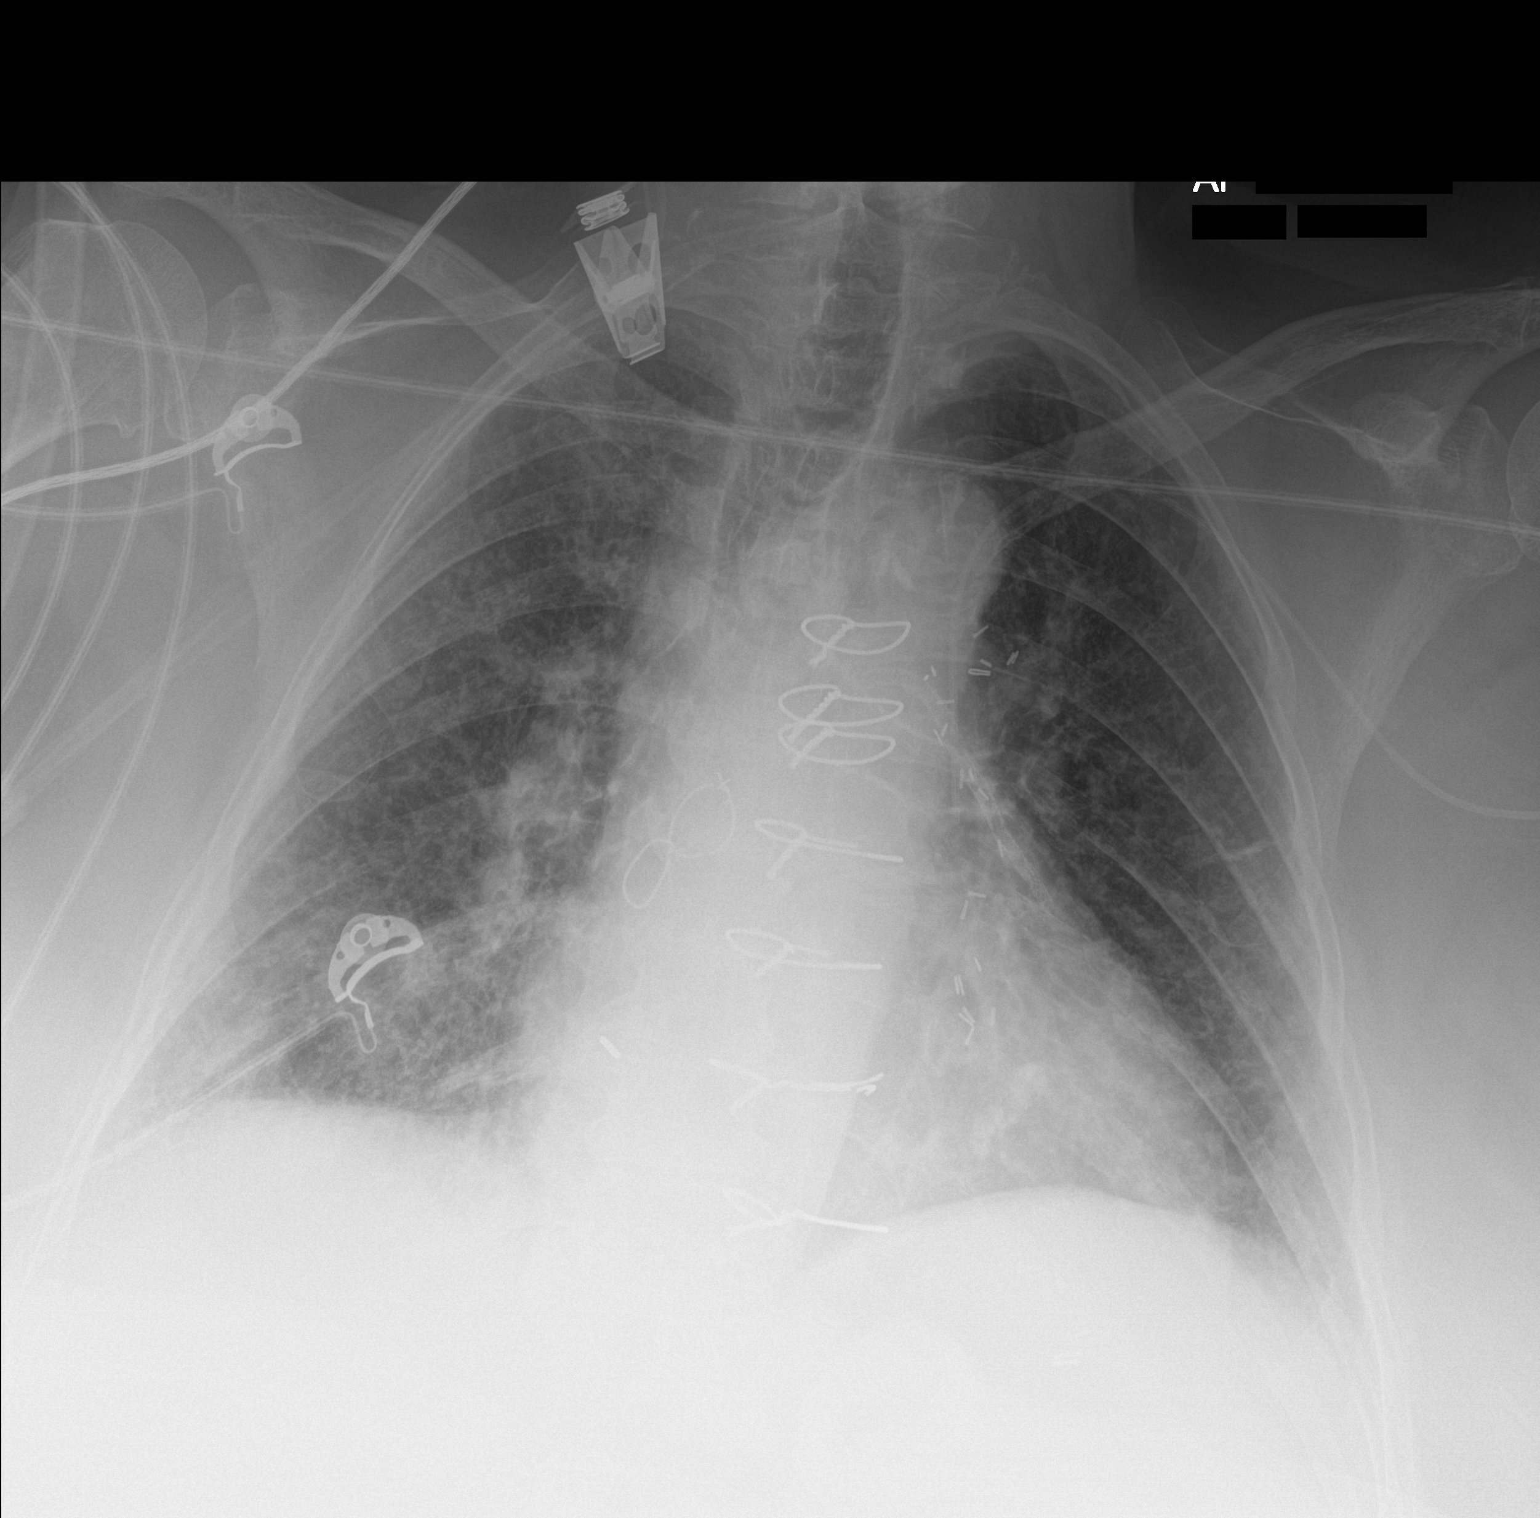

[1 of 1 positions shown; findings below may reference images not displayed]

FINDINGS: Postoperative changes in the mediastinum. Mild cardiac enlargement.
Patchy perihilar infiltrates in both lungs may represent edema or
multifocal pneumonia. No pleural effusions. No pneumothorax.
Mediastinal contours appear intact.
IMPRESSION: Cardiac enlargement with patchy perihilar infiltrates in both lungs,
possibly edema or multifocal pneumonia.

## 2022-09-01 ENCOUNTER — Telehealth: Payer: Self-pay | Admitting: Internal Medicine

## 2022-09-01 ENCOUNTER — Ambulatory Visit: Payer: Self-pay

## 2022-09-01 NOTE — Patient Instructions (Signed)
Visit Information  Thank you for taking time to visit with me today. Please don't hesitate to contact me if I can be of assistance to you.   Following are the goals we discussed today:   Goals Addressed             This Visit's Progress    Community resource needs       Care Coordination Interventions: [employer] supplies contacted. Spoke with Nira Conn who confirmed that patient received her hospital bed from them and that payment will be completed in 2 weeks. RNCM request Heather contact patient with questions about receiving an updated bed. Contact number for [facility] provided to patient.  Discussed Medicaid CM. Patient states she has a Medicaid Tourist information centre manager. Patient to contact Medicaid case manager regarding Medicaid benefits. Discussed importance of follow up with providers as recommended.    Reviewed scheduled appointment with patient and encouraged to continue to attend provider visits as scheduled/recommended. Encouraged patient to schedule follow up with cardiology       Our next appointment is by telephone on 09/17/22 at 11:00 am  Please call the care guide team at [phone] if you need to cancel or reschedule your appointment.   If you are experiencing a Mental Health or [place] or need someone to talk to, please call the Suicide and Crisis Lifeline: [place], RN, MSN, BSN, [place] [phone]

## 2022-09-01 NOTE — Telephone Encounter (Signed)
Caller & Relationship to patient: Self  Call back number: [phone]   Date of last office visit: 10.31.23  Date of next office visit: 1.24.24  Medication(s) to be refilled:  Oxycodone HCl 10 MG TABS   Pt is also requesting an antibiotic for a sinus infection  Preferred Pharmacy:   [id number]  Phone: [phone] Fax: [phone]

## 2022-09-01 NOTE — Patient Outreach (Signed)
  Care Coordination   Follow Up Visit Note   09/01/2022 Name: Katherine [name] MRN: [id number] DOB: 1956/07/29  Katherine [name] is a 67 y.o. year old female who sees [name], MD for primary care. I spoke with  Katherine [name] by phone today.  What matters to the patients health and wellness today?  Upcoming appointment discussed. Katherine [name] has an appoitnemtn with PCP and pulmonary on 09/09/22. Katherine [name] reports she will have to reschedule one of the appointments. She states she is going to discuss equipment request with primary care provider at next visit. Patient has a hospital bed and would like a more up to date bed. She reports she thinks the bed she has now [facility] and she is also going to discuss her request for CGM.  Per review of chart, multiple missed and canceled appointments with cardiology. Patient to schedule appointment with cardiologist. Contact number for [facility] regarding Bed. Patient to contact Medicaid case manager    Goals Addressed             This Visit's Progress    Community resource needs       Care Coordination Interventions: [employer] supplies contacted. Spoke with Nira Conn who confirmed that patient received her hospital bed from them and that payment will be completed in 2 weeks. RNCM request Heather contact patient with questions about receiving an updated bed. Contact number for [place] provided to patient.  Discussed Medicaid CM. Patient states she has a Medicaid Tourist information centre manager. Patient to contact Medicaid case manager regarding Medicaid benefits. Discussed importance of follow up with providers as recommended.    Reviewed scheduled appointment with patient and encouraged to continue to attend provider visits as scheduled/recommended. Encouraged patient to schedule follow up with cardiology       SDOH assessments and interventions completed:   No  Care Coordination Interventions:  Yes, provided   Follow up plan: Follow up call scheduled for 09/17/22    Encounter Outcome:  Pt. Visit Completed   [name], RN, MSN, BSN, [place] Coordinator [phone]

## 2022-09-02 ENCOUNTER — Institutional Professional Consult (permissible substitution): Payer: Medicare Other | Admitting: Plastic Surgery

## 2022-09-02 ENCOUNTER — Other Ambulatory Visit: Payer: Self-pay | Admitting: Internal Medicine

## 2022-09-02 DIAGNOSIS — M47816 Spondylosis without myelopathy or radiculopathy, lumbar region: Secondary | ICD-10-CM

## 2022-09-02 DIAGNOSIS — M1711 Unilateral primary osteoarthritis, right knee: Secondary | ICD-10-CM

## 2022-09-02 DIAGNOSIS — M4726 Other spondylosis with radiculopathy, lumbar region: Secondary | ICD-10-CM

## 2022-09-02 DIAGNOSIS — Z515 Encounter for palliative care: Secondary | ICD-10-CM

## 2022-09-02 MED ORDER — OXYCODONE HCL 10 MG PO TABS: 10.0000 mg | ORAL_TABLET | Freq: Three times a day (TID) | ORAL | 0 refills | Status: DC | PRN

## 2022-09-09 ENCOUNTER — Ambulatory Visit: Payer: Medicaid Other | Admitting: Emergency Medicine

## 2022-09-09 ENCOUNTER — Ambulatory Visit: Payer: Medicare Other | Admitting: Internal Medicine

## 2022-09-11 IMAGING — MR MR LUMBAR SPINE W/O CM
4 of 5 series · 17 of 48 positions shown · non-contrast
Comparison: Prior MRI from 08/25/2017.

CLINICAL DATA: Initial evaluation for low back pain with right
lower extremity pain. Immunocompromised.

EXAM:
MRI LUMBAR SPINE WITHOUT CONTRAST
TECHNIQUE: Multiplanar, multisequence MR imaging of the lumbar spine was
performed. No intravenous contrast was administered.

[Series 3: T2 · sagittal · 4.0mm · 0.55mm/px · 6 of 16 slices shown (1 of 2)]
[im 1/16]
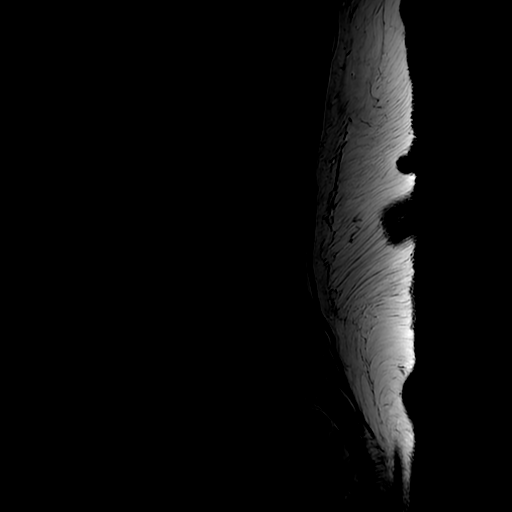
[im 4/16]
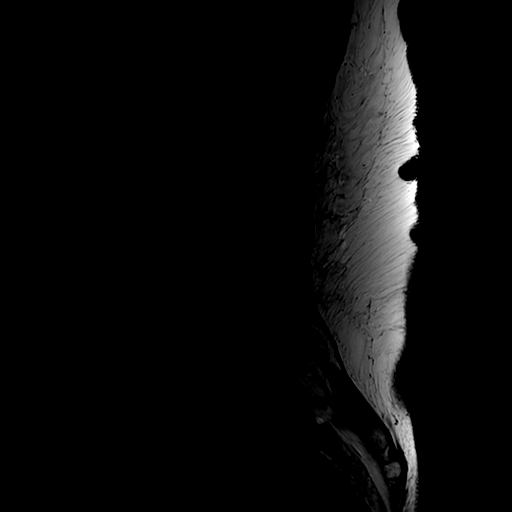
[im 7/16]
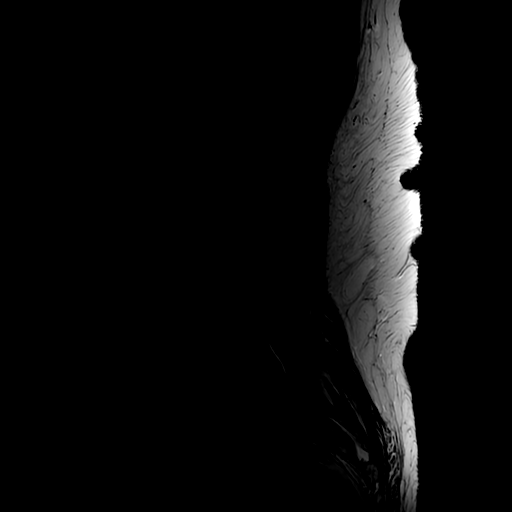
[im 10/16]
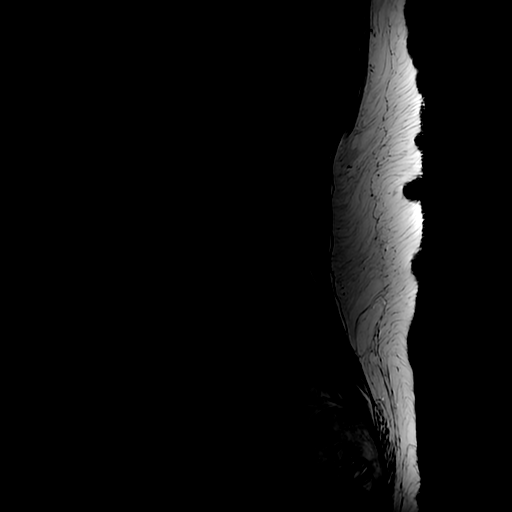
[im 13/16]
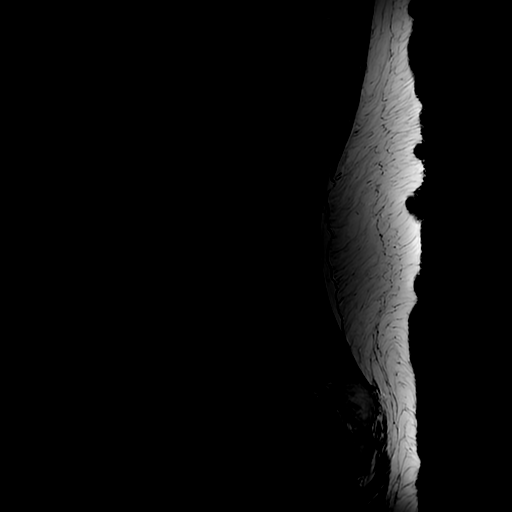
[im 16/16]
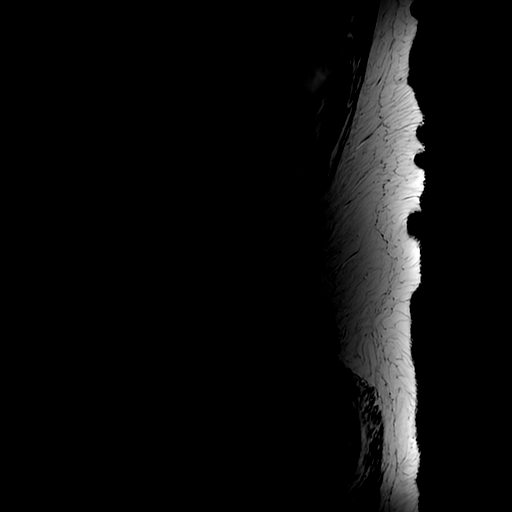

[Series 5: T1 · sagittal · 4.0mm · 0.55mm/px · 3 of 16 slices shown (1 of 2)]
[im 4/16]
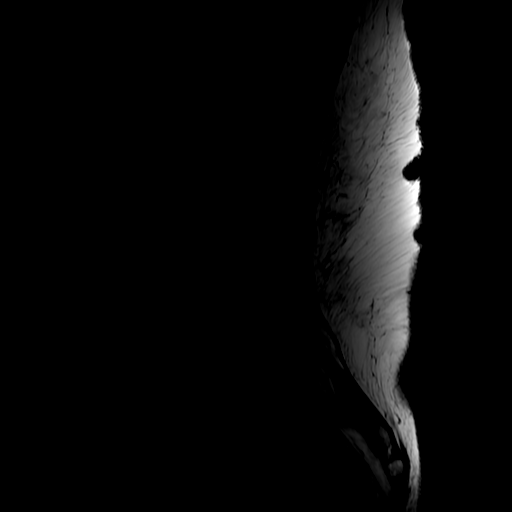
[im 10/16]
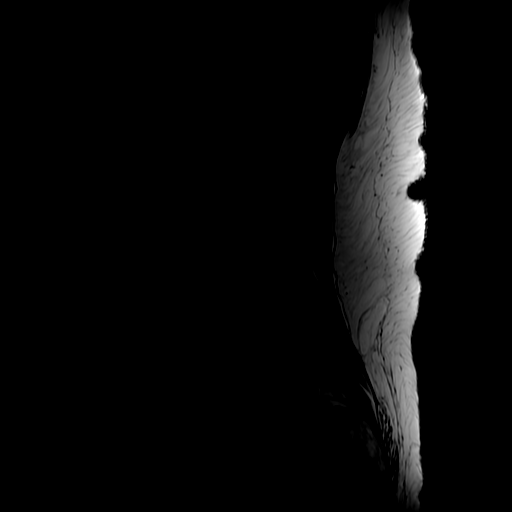
[im 16/16]
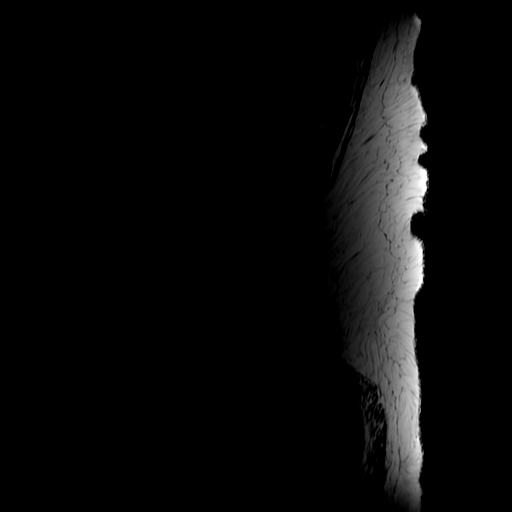

[Series 6: T2 · axial · 4.0mm · 0.39mm/px · z∈[-21,+153]mm · 5 of 42 slices shown (2 of 2)]
[im 1/42]
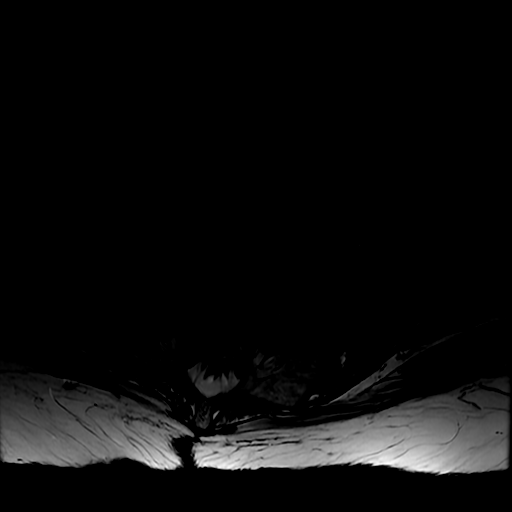
[im 6/42]
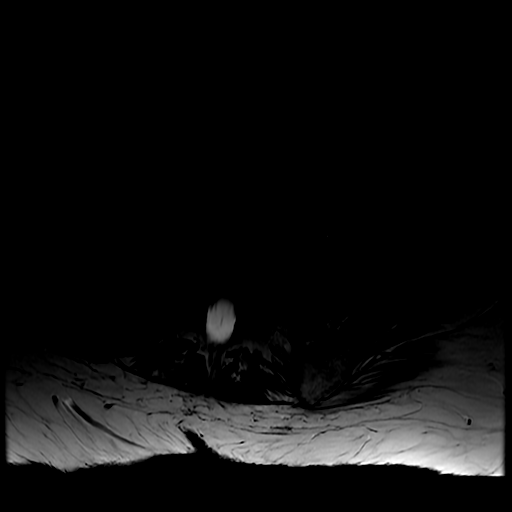
[im 12/42]
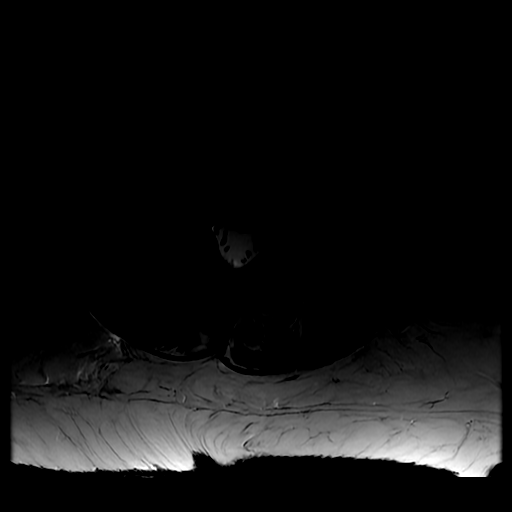
[im 21/42]
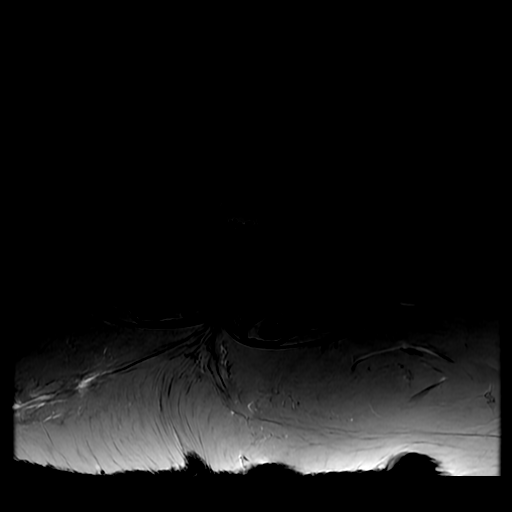
[im 36/42]
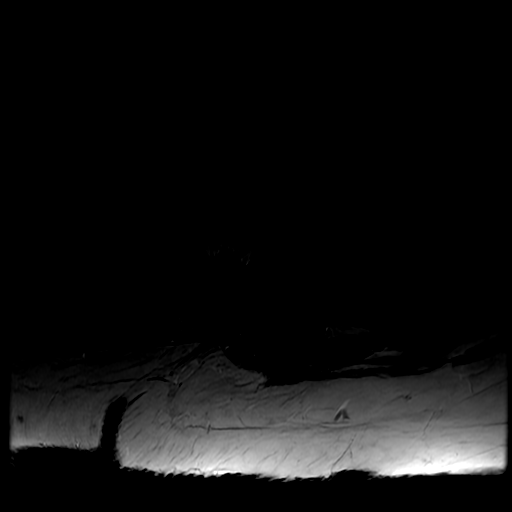

[Series 7: T1 · axial · 4.0mm · 0.39mm/px · z∈[+4,+153]mm · 3 of 42 slices shown (2 of 2)]
[im 6/42]
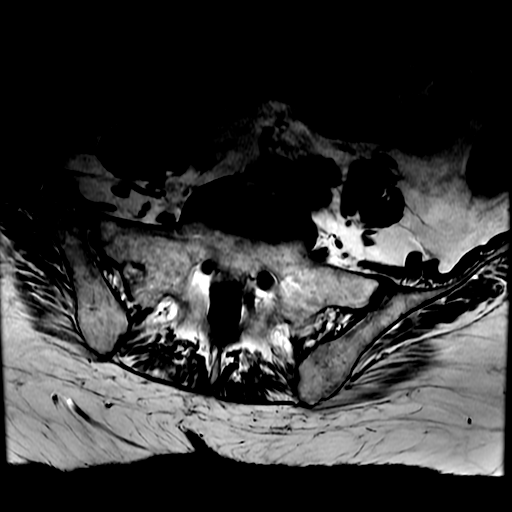
[im 21/42]
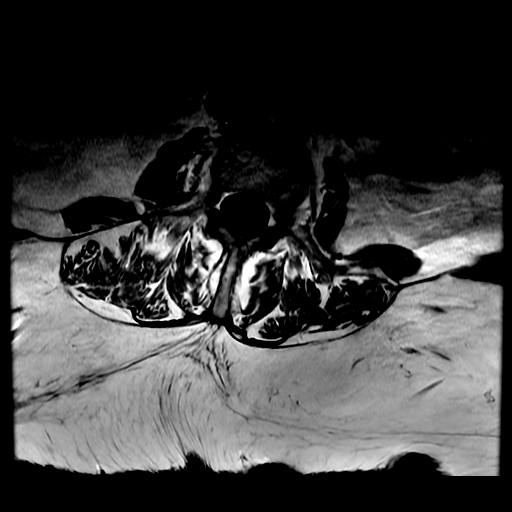
[im 36/42]
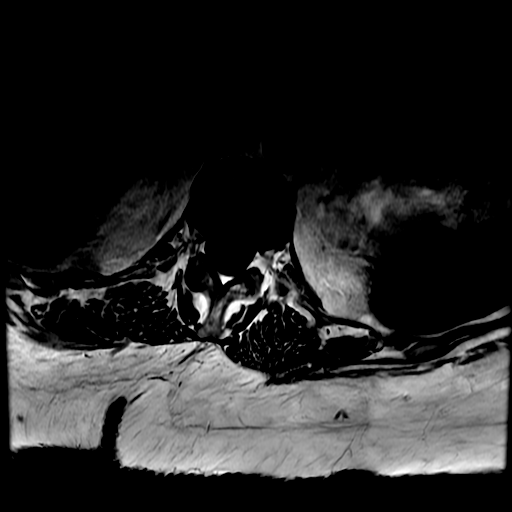

[17 of 48 positions shown; findings below may reference images not displayed]

FINDINGS: Segmentation: Standard. Lowest well-formed disc space labeled the
L5-S1 level.

Alignment: 4 mm anterolisthesis of L4 on L5, chronic and facet
mediated, and mildly progressed from prior. Trace retrolisthesis of
T11 on T12, stable. Alignment otherwise normal with preservation of
the normal lumbar lordosis.

Vertebrae: Vertebral body height maintained without acute or chronic
fracture. Bone marrow signal intensity within normal limits. No
discrete or worrisome osseous lesions or abnormal marrow edema.

Conus medullaris and cauda equina: Conus extends to the L1-2 level.
Conus and cauda equina appear normal.

Paraspinal and other soft tissues: Paraspinous soft tissues
demonstrate no acute finding. The visualized native kidneys are
markedly atrophic. Multifocal atheromatous irregularity seen
throughout the intra-abdominal aorta without visible aneurysm.

Disc levels:

T10-11: Seen only on sagittal projection. Intervertebral disc space
narrowing with disc desiccation and mild disc bulge. Bilateral facet
hypertrophy. No significant spinal stenosis. Foramina appear grossly
patent.

T11-12: Intervertebral disc space narrowing with mild disc bulge and
disc desiccation. No significant stenosis.

T12-L1: Unremarkable.

L1-2: Disc desiccation without significant disc bulge. No canal or
foraminal stenosis.

L2-3: Disc desiccation without significant disc bulge. No canal or
foraminal stenosis.

L3-4: Mild intervertebral disc space narrowing with diffuse disc
bulge. Superimposed broad-based left foraminal to extraforaminal
disc protrusion contacts the exiting left L3 nerve root (series 6,
image 24). Associated annular fissure. Mild bilateral facet
hypertrophy. No significant spinal stenosis. Mild left L3 foraminal
narrowing. Right neural foramina remains patent.

L4-5: 4 mm anterolisthesis. Mild disc bulge with disc desiccation.
Superimposed left foraminal to extraforaminal disc protrusion
contacts the exiting left L4 nerve root (series 3, image 12).
Advanced bilateral facet arthrosis, progressed from previous. No
significant canal or lateral recess stenosis. Mild left greater than
right L4 foraminal narrowing.

L5-S1: Broad-based left eccentric disc bulge closely approximates
and/or contacts the exiting left L5 nerve root (series 6, image 35).
Moderate bilateral facet arthrosis. No significant spinal stenosis.
Mild left L5 foraminal narrowing without impingement. Right foramen
remains patent.
IMPRESSION: 1. Broad-based left foraminal to extraforaminal disc protrusions at
L3-4 and L4-5, contacting and potentially irritating the exiting
left L3 and L4 nerve roots respectively.
2. Broad-based left eccentric disc bulge at L5-S1, closely
approximating and/or potentially irritating the exiting left L5
nerve root.
3. Advanced bilateral facet arthrosis at L4-5 with associated 4 mm
anterolisthesis, mildly progressed as compared to 5110.
4. No other acute abnormality within the lumbar spine. No findings
to explain patient's right lower extremity symptoms identified.

Aortic Atherosclerosis (FR01R-602.2).

## 2022-09-12 IMAGING — DX DG CHEST 2V
2 series · 2 of 2 positions shown · non-contrast
Comparison: 08/14/2021

CLINICAL DATA: Shortness of breath

EXAM:
CHEST - 2 VIEW

[chest ap]
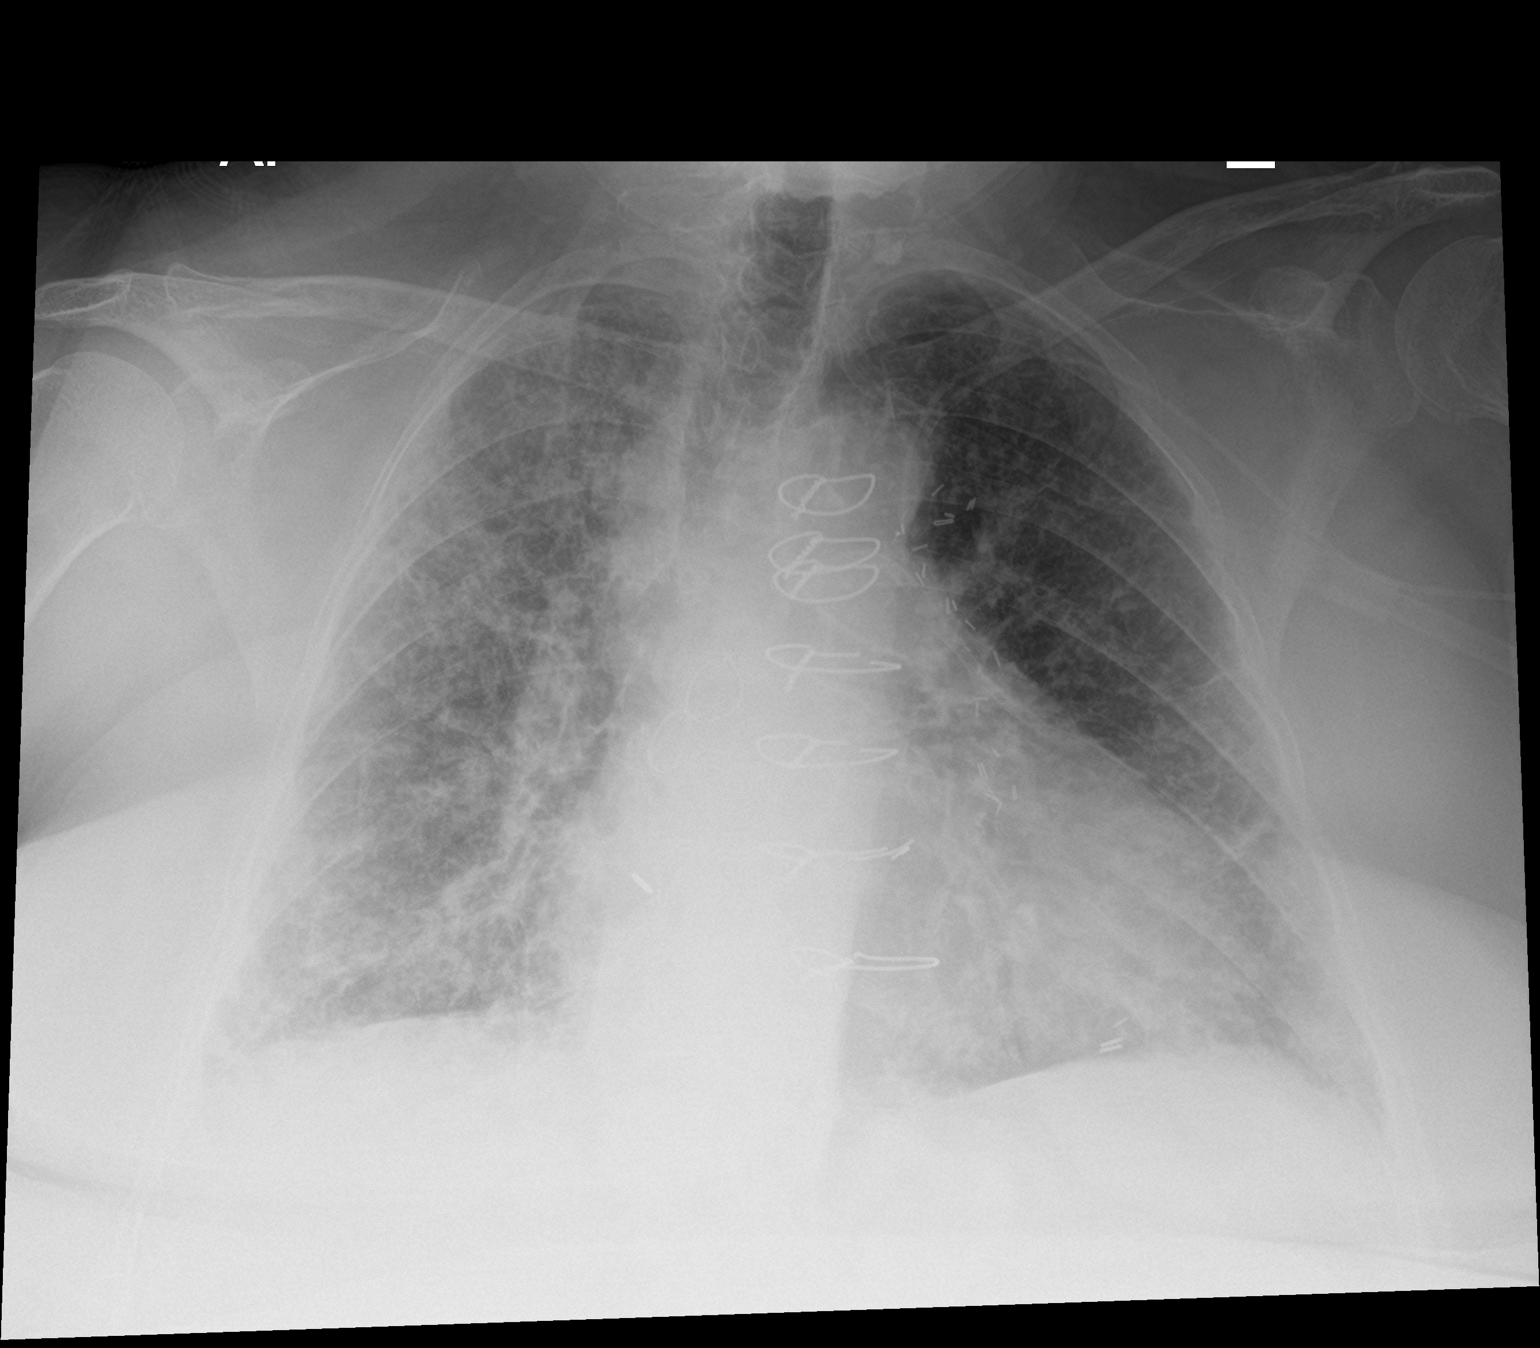

[chest lat]
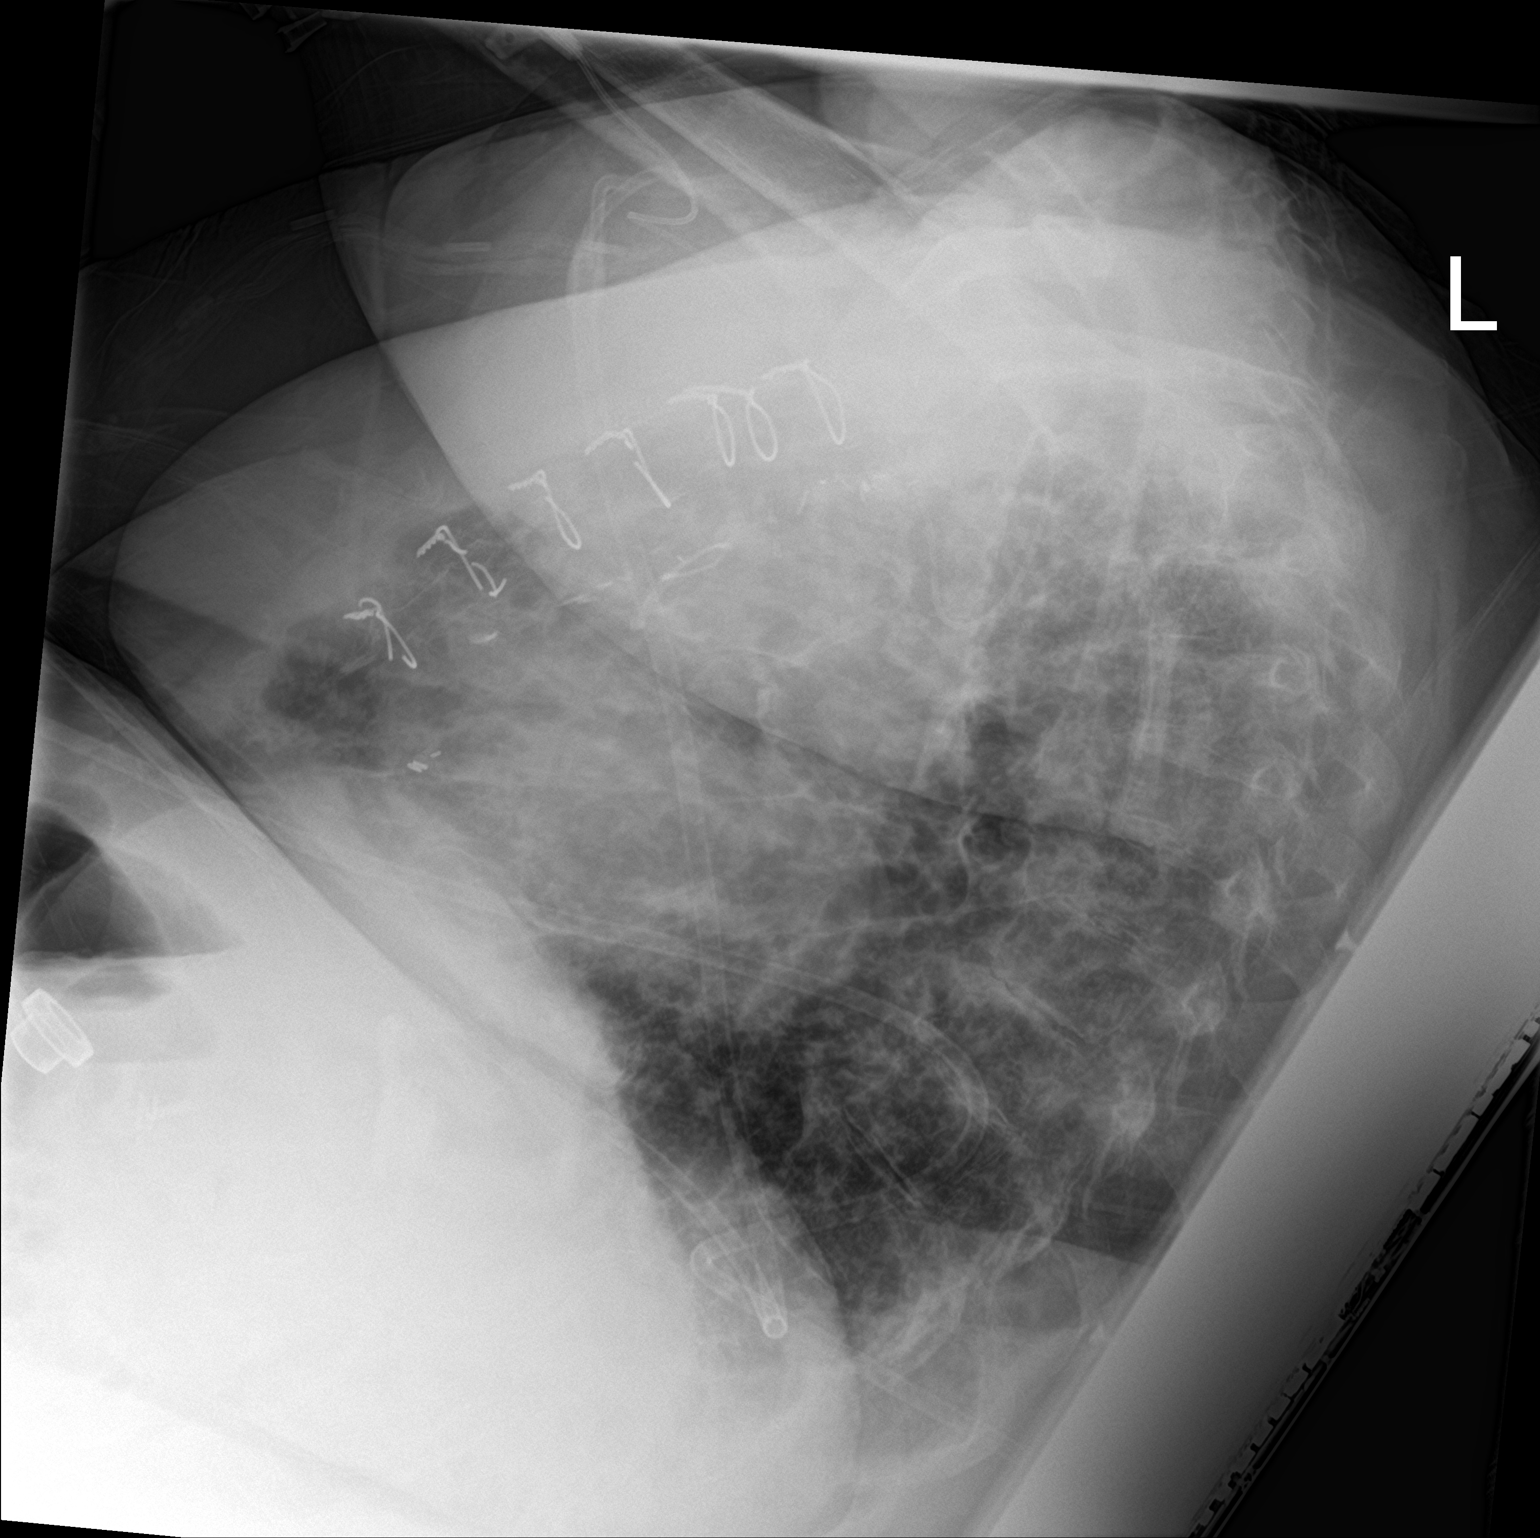

[2 of 2 positions shown; findings below may reference images not displayed]

FINDINGS: Cardiac shadow remains enlarged. Postsurgical changes are noted.
Increased vascular congestion and mild pulmonary edema right greater
than left is noted. Left-sided PICC line is again identified likely
in the left SVC. No pneumothorax or sizable effusion is seen. No
bony abnormality is noted.
IMPRESSION: CHF with parenchymal edema worse on the right than the left.

These results will be called to the ordering clinician or
representative by the Radiologist Assistant, and communication
documented in the PACS or [REDACTED].

## 2022-09-14 ENCOUNTER — Other Ambulatory Visit: Payer: Self-pay | Admitting: Internal Medicine

## 2022-09-14 DIAGNOSIS — M47816 Spondylosis without myelopathy or radiculopathy, lumbar region: Secondary | ICD-10-CM

## 2022-09-15 IMAGING — DX DG KNEE 1-2V*R*
2 series · 2 of 2 positions shown · non-contrast
Comparison: None.

CLINICAL DATA: Sharp right knee pain, no injury.

EXAM:
RIGHT KNEE - 1-2 VIEW

[x knee ap right]
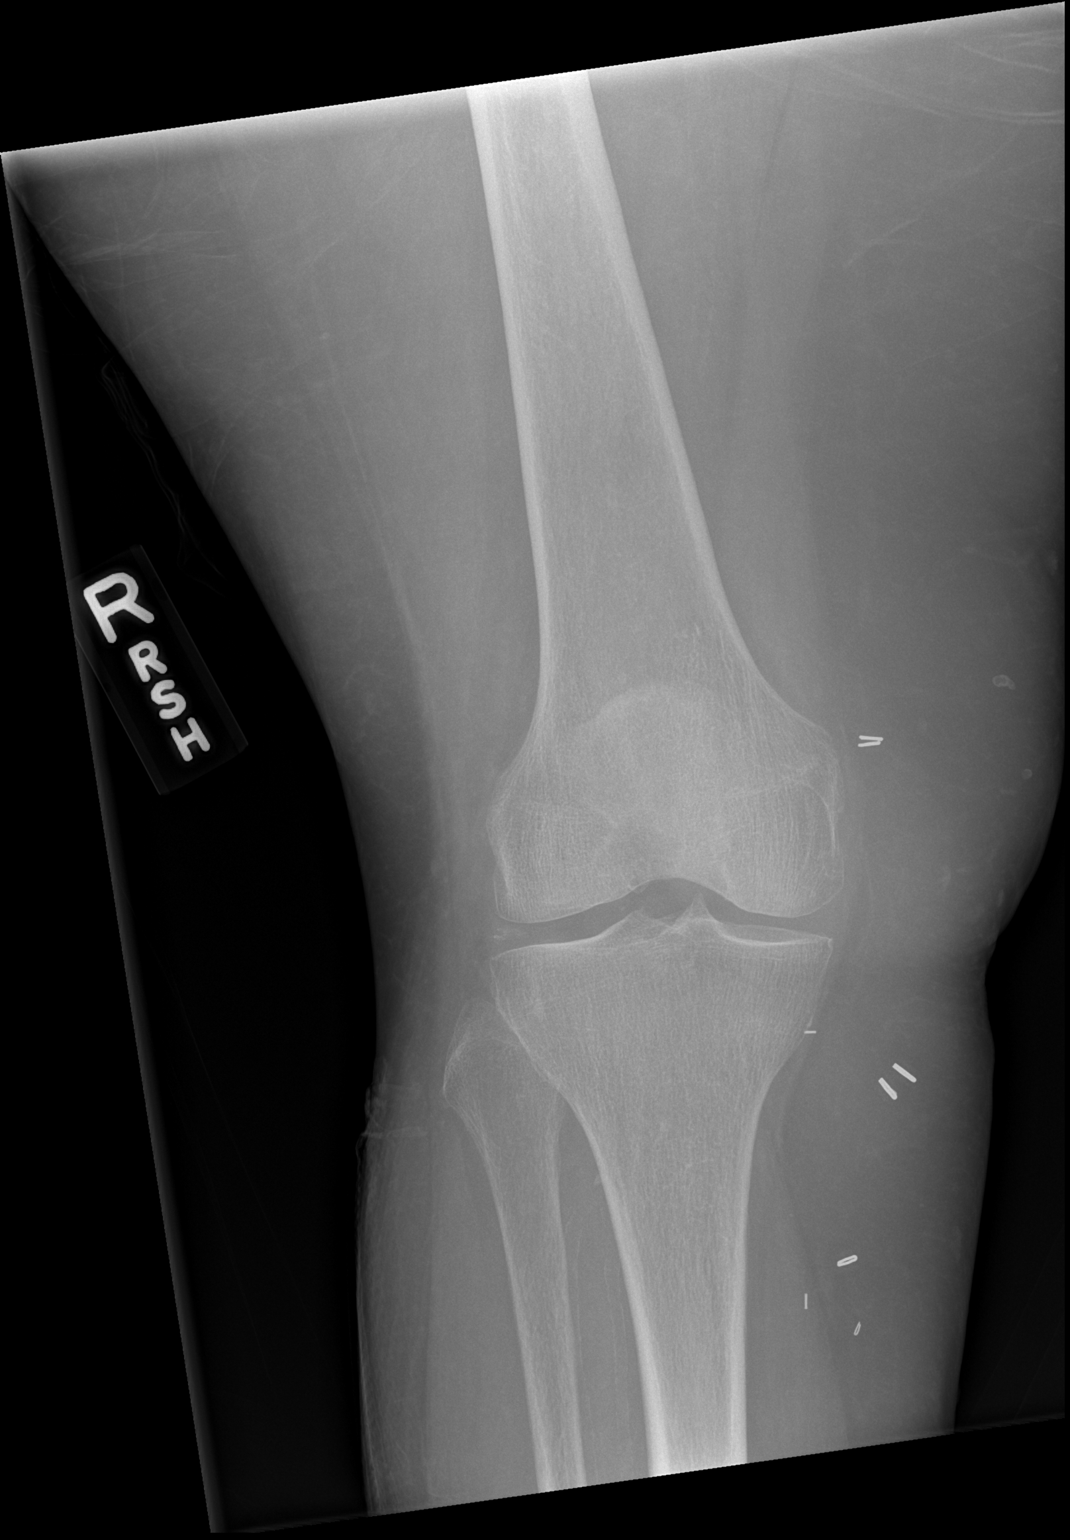

[x knee lat right]
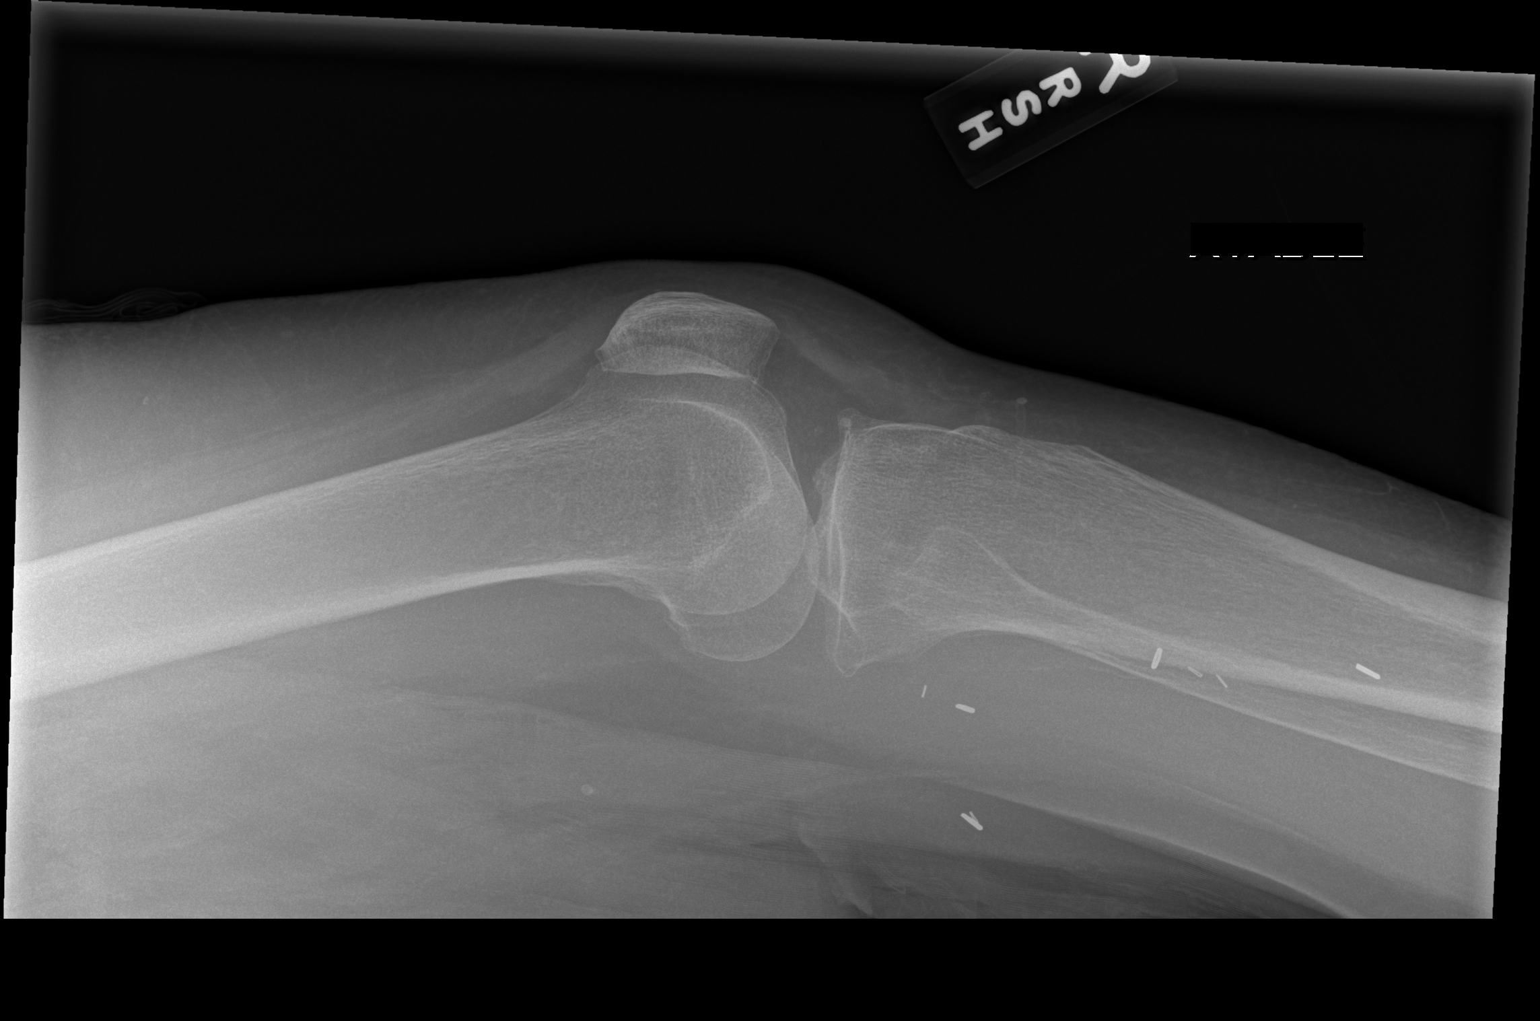

[2 of 2 positions shown; findings below may reference images not displayed]

FINDINGS: Lateral view is slightly rotated. No definite joint effusion. No
fracture. Chondrocalcinosis in the lateral compartment. Scattered
surgical clips in the soft tissues.
IMPRESSION: 1. No acute findings.
2. Chondrocalcinosis in the lateral compartment of the knee.

## 2022-09-16 ENCOUNTER — Institutional Professional Consult (permissible substitution): Payer: Medicare Other | Admitting: Plastic Surgery

## 2022-09-16 IMAGING — DX DG CHEST 1V PORT
1 series · 1 of 1 positions shown · non-contrast
Comparison: None.

CLINICAL DATA: Short of breath

EXAM:
PORTABLE CHEST 1 VIEW

[chest]
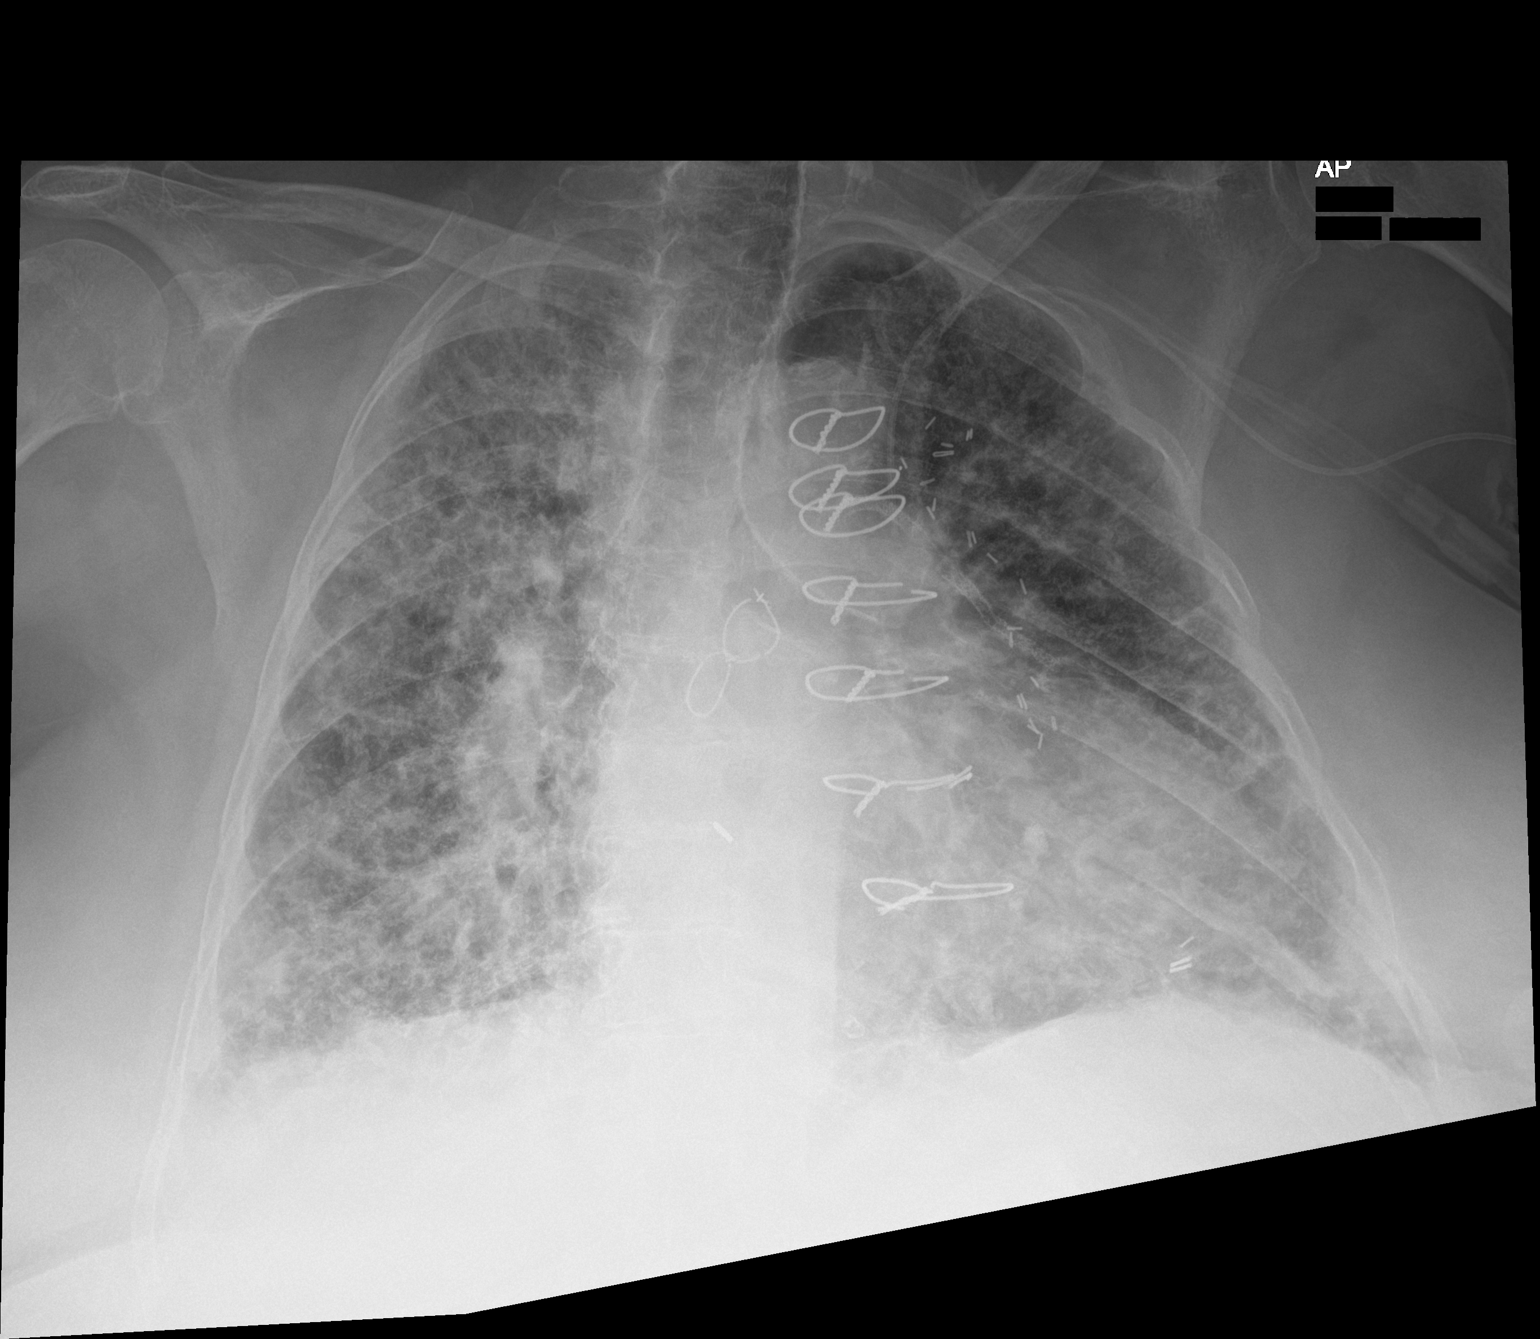

[1 of 1 positions shown; findings below may reference images not displayed]

FINDINGS: Sternotomy wires overlie normal cardiac silhouette. Patient rotated
leftward.

Diffuse patchy airspace opacities are similar comparison exam. No
pleural fluid. No pneumothorax.
IMPRESSION: No change in diffuse patchy bilateral airspace disease consistent
with multifocal pneumonia versus pulmonary edema.

## 2022-09-17 ENCOUNTER — Ambulatory Visit: Payer: Self-pay

## 2022-09-17 NOTE — Patient Outreach (Signed)
  Care Coordination   Follow Up Visit Note   09/17/2022 Name: Katherine [name] MRN: [id number] DOB: [birth year]  Katherine [name] is a 67 y.o. year old female who sees Katherine Lima, MD for primary care. I spoke with  Katherine [name] by phone today.  What matters to the patients health and wellness today? Katherine [name] reports she thinks she has a sinus infection and has not feeling good: congestion, headache. She reports increase oxygen requirement from 4l/[place] to 7l. She states she has a pulse oximeter and is adjusting her oxygen. She reports upcoming appointment with PCP on 09/22/22. Appointment with pulmonologist was due on 09/09/22, but was cancelled by patient. Katherine [name] states she is going to call to schedule appointment with pulmonologist.   Goals Addressed             This Visit's Progress    COMPLETED: Community resource needs       Care Coordination Interventions: Confirmed she was contacted by [facility] regarding her hospital bed. She reports they brought her a new mattress Informed patient that it is possible to get too much oxygen and encouraged patient to contact pulmonologist to discuss increased oxygen requirements and schedule appointment.  Discussed use of distilled water for humidifiing system Reviewed medications and encouraged to inhalers and as needed nebulizer as prescribed. Also discussed saline nasal rinses and flonase to help decrease congestion    Reviewed upcoming/scheduled appointments Discussed Health Equity program. Provided contact number for Katherine Rink, NP Care coordinator [phone]). Transition to Health Equity program-update C. Spinks, NP.       SDOH assessments and interventions completed:  No  Care Coordination Interventions:  Yes, provided   Follow up plan: No further intervention required.   Encounter Outcome:  Pt. Visit Completed   [name], RN, MSN, BSN, [place] Coordinator [phone]

## 2022-09-17 NOTE — Patient Instructions (Signed)
Visit Information  Thank you for taking time to visit with me today. Please don't hesitate to contact me if I can be of assistance to you.   Following are the goals we discussed today:   Goals Addressed             This Visit's Progress    COMPLETED: Community resource needs       Care Coordination Interventions: Confirmed she was contacted by [facility] regarding her hospital bed. She reports they brought her a new mattress Informed patient that it is possible to get too much oxygen and encouraged patient to contact pulmonologist to discuss increased oxygen requirements and schedule appointment.  Discussed use of distilled water for humidifiing system Reviewed medications and encouraged to inhalers and as needed nebulizer as prescribed. Also discussed saline nasal rinses and flonase to help decrease congestion    Reviewed upcoming/scheduled appointments Discussed Health Equity program. Provided contact number for Benard Rink, NP Care coordinator [phone]). Transition to Health Equity program-update C. Spinks, NP.       If you are experiencing a Mental Health or [place] or need someone to talk to, please call the Suicide and Crisis Lifeline: [place], RN, MSN, BSN, [employer] [phone]

## 2022-09-19 ENCOUNTER — Other Ambulatory Visit: Payer: Self-pay | Admitting: Internal Medicine

## 2022-09-19 DIAGNOSIS — E876 Hypokalemia: Secondary | ICD-10-CM

## 2022-09-19 DIAGNOSIS — I10 Essential (primary) hypertension: Secondary | ICD-10-CM

## 2022-09-21 IMAGING — DX DG CHEST 1V PORT
1 series · 1 of 1 positions shown · non-contrast
Comparison: 09/02/2021

CLINICAL DATA: Shortness of breath

EXAM:
PORTABLE CHEST 1 VIEW

[chest]
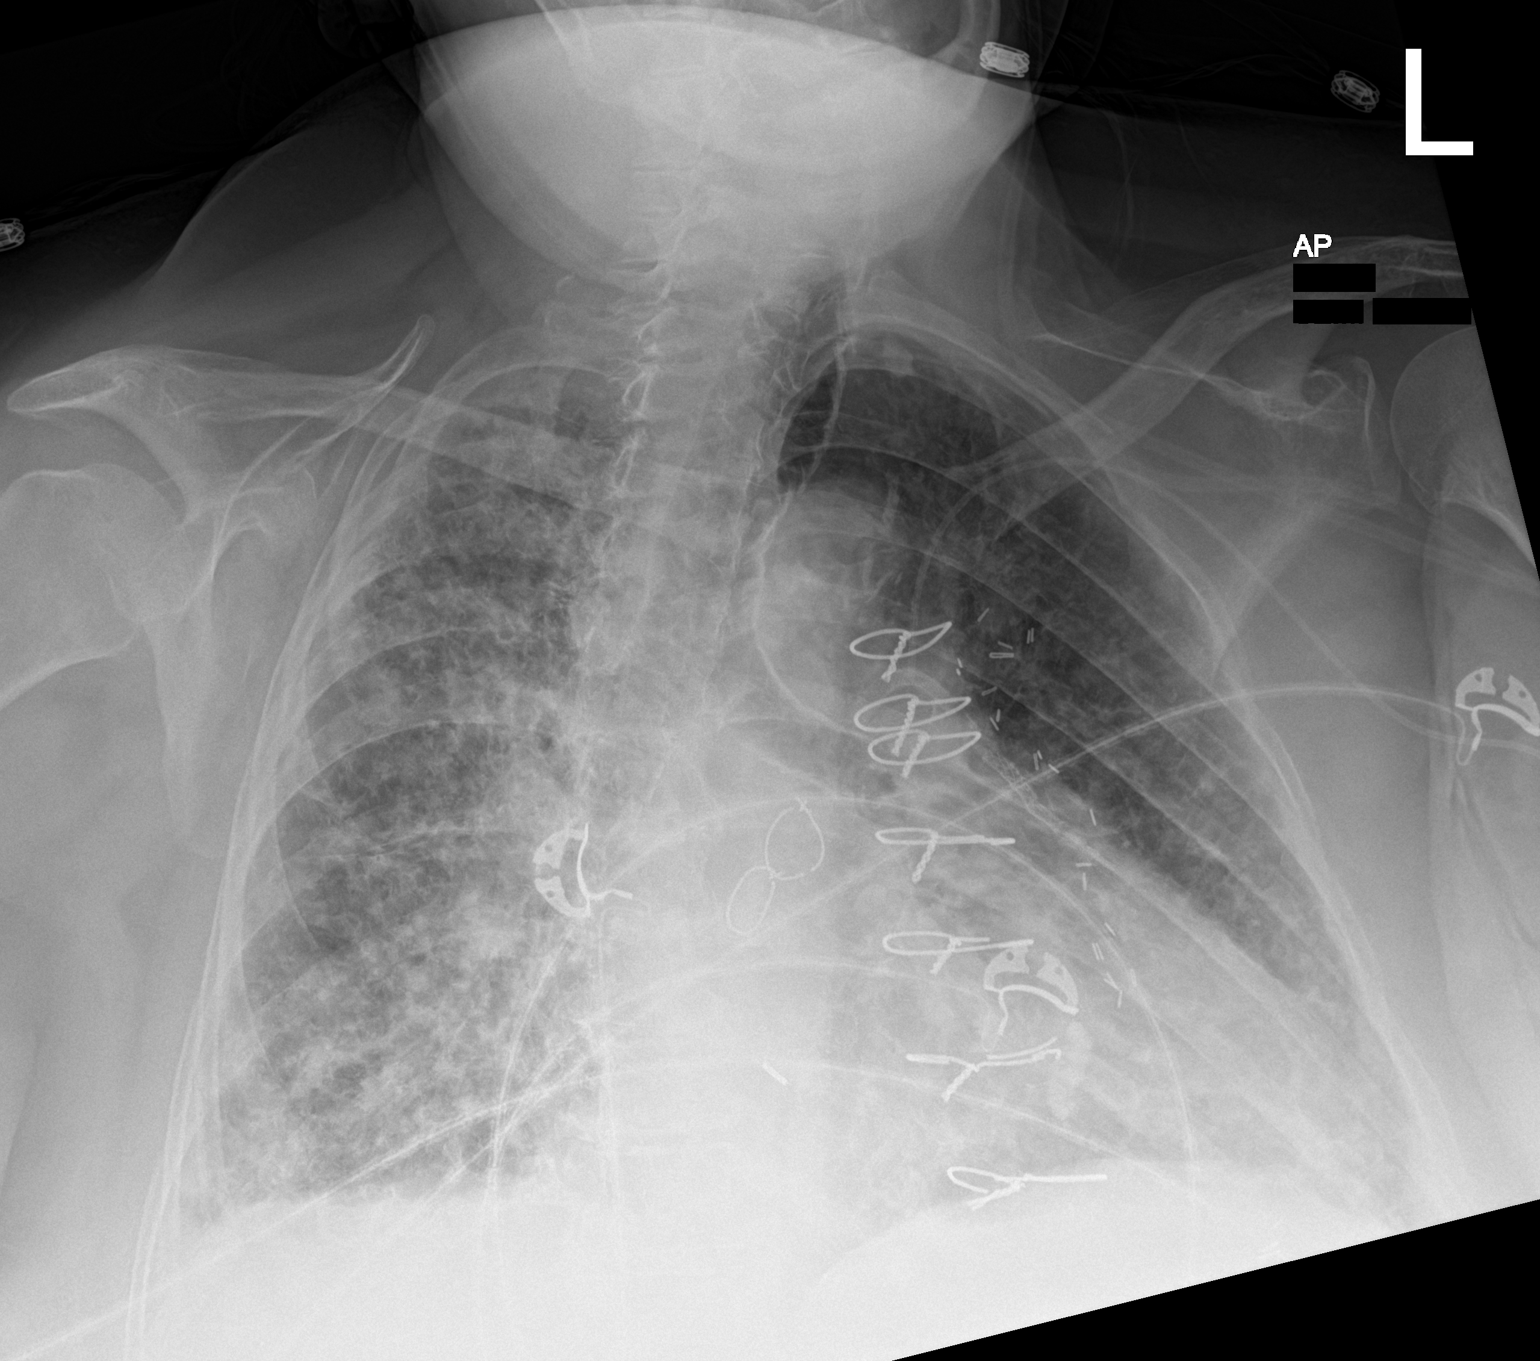

[1 of 1 positions shown; findings below may reference images not displayed]

FINDINGS: No significant change in rotated AP portable examination, with
cardiomegaly status post median sternotomy and CABG. Unchanged,
diffuse bilateral interstitial opacity. Left upper extremity PICC.
IMPRESSION: No significant change in rotated AP portable examination, with
cardiomegaly status post median sternotomy and CABG. Unchanged,
diffuse bilateral interstitial opacity, consistent with edema or
infection.

## 2022-09-22 ENCOUNTER — Ambulatory Visit: Payer: Medicare Other | Admitting: Internal Medicine

## 2022-09-24 ENCOUNTER — Encounter (HOSPITAL_COMMUNITY): Payer: Self-pay | Admitting: *Deleted

## 2022-09-26 ENCOUNTER — Other Ambulatory Visit: Payer: Self-pay | Admitting: Internal Medicine

## 2022-09-28 ENCOUNTER — Telehealth: Payer: Self-pay | Admitting: Internal Medicine

## 2022-09-28 NOTE — Telephone Encounter (Signed)
error 

## 2022-09-29 ENCOUNTER — Encounter: Payer: Self-pay | Admitting: Internal Medicine

## 2022-09-29 NOTE — Progress Notes (Unsigned)
Virtual Visit via telephone Note  I connected with Katherine [name] on 09/29/22 at  9:30 AM EST by telephone since she was not able to log on for a virtual video appointment.  I verified that I am speaking with the correct person using two identifiers.   I discussed the limitations of evaluation and management by telemedicine and the availability of in person appointments. The patient expressed understanding and agreed to proceed.  Present for the visit:  Myself, Dr Billey Gosling, [name].  The patient is currently at home and I am in the office.    No referring provider.    History of Present Illness: She is here for an acute visit for cold symptoms.   Her symptoms started > one month ago.    Her legs are bad - no circulation in legs - limited in walking.  S/p kidney transplant. On oxygen continuously - oxygen recently in the 80's.  This morning it was 94%.  She has needed to increase the amount of oxygen she is on  She is experiencing Low-grade fevers, chills, ear pain, nasal congestion, sinus pain, sore throat, cough that is productive of yellow thick mucus, shortness of breath, wheeze, nausea, diarrhea, body aches, headaches and dizziness.  She is also experiencing chest pain and she is taking nitroglycerin which is effective.  Sometimes she has to take nitroglycerin twice a day.  She has chronic palpitations from atrial fibrillation so that is not new.  She has leg swelling in the right leg.   She has tried taking Her inhalers, Tylenol.  Symptoms or not improving  Her symptoms feel like bronchitis which she has had in the past.  She is concerned because her oxygen level is low.  She did not feel that she was able to come into the office and does not want to go to the emergency room.   Review of Systems  Constitutional:  Positive for chills and fever (low grade fever).  HENT:  Positive for congestion, ear pain, sinus pain and sore throat.        Some change in taste/smell  from covid last year  Respiratory:  Positive for cough (productive of yellow, thick mucus), sputum production, shortness of breath and wheezing. Negative for hemoptysis.   Cardiovascular:  Positive for chest pain, palpitations (chronic afib) and leg swelling (right leg).  Gastrointestinal:  Positive for diarrhea and nausea.  Musculoskeletal:  Positive for myalgias.  Neurological:  Positive for dizziness and headaches.      Social History   Socioeconomic History   Marital status: Widowed    Spouse name: Not on file   Number of children: 0   Years of education: Not on file   Highest education level: High school graduate  Occupational History   Occupation: Disability    Comment: since 1989  Tobacco Use   Smoking status: Former    Packs/day: 1.00    Years: 30.00    Total pack years: 30.00    Types: Cigarettes    Quit date: 08/17/2002    Years since quitting: 20.1   Smokeless tobacco: Never  Vaping Use   Vaping Use: Never used  Substance and Sexual Activity   Alcohol use: No   Drug use: No   Sexual activity: Not Currently  Other Topics Concern   Not on file  Social History Narrative   ** Merged History Encounter ** She is currently married, and the caregiver of her husband who is recovering from surgery for tongue  cancer now diagnosed with lung cancer. Prior to his diagnosis of her husband, she actually had adopted a 64-year-old child who she knows caring for as well. With all the surgeries, they have been quite financially troubled. Thanks the help of her community and church, they have been able to stay "alfoat."     She is a former smoker who quit in 2004 after a 30-pack-year history.   She is active chasing a 27-year-old child, does not do routine exercise. She's been quite depressed with the condition of her husband, and admits to eating comfort herself.   She does not drink alcohol.      05/13/2021 Patient reports that her husband is deceased. She is grieving the loss of her  husband and the decline in her physical independence.    Social Determinants of Health   Financial Resource Strain: Not on file  Food Insecurity: No Food Insecurity (07/20/2022)   Hunger Vital Sign    Worried About Running Out of Food in the Last Year: Never true    Ran Out of Food in the Last Year: Never true  Transportation Needs: No Transportation Needs (05/12/2022)   PRAPARE - Hydrologist (Medical): No    Lack of Transportation (Non-Medical): No  Physical Activity: Not on file  Stress: Not on file  Social Connections: Not on file     Observations/Objective:   Assessment and Plan:  Community-acquired pneumonia: Acute Concern for community-acquired pneumonia Very limited in my assessment since this is a telephone visit, but cold symptoms in addition to productive cough, shortness of breath, wheezing, decreased oxygen level requiring increased required liters of oxygen make me suspicious of community-acquired pneumonia She is not able to come in to be evaluated in person and she does not want to go to the emergency room Continue inhalers religiously Start doxycycline 100 mg twice daily x 10 days Stressed that she needs to follow-up in person or be evaluated in the emergency room if her symptoms are worsening or not improving  Chest pain/angina: Having recurrent chest pain that is responding to nitroglycerin-taking nitroglycerin 1-2 times a day Stressed that this is concerning given her significant CAD Advised calling cardiology-she has not spoken to them Discussed that if this continues she needs to either call cardiology or if it worsens she needs to be seen in the emergency room, especially if the nitroglycerin is not helping   Follow Up Instructions:    I discussed the assessment and treatment plan with the patient. The patient was provided an opportunity to ask questions and all were answered.  I discussed with her more than once that her  symptoms are very concerning and because of the recurrent chest pain, using nitroglycerin which is helping, low oxygen level and infection that she should be evaluated in person if she is not able to come into the office then should go to the emergency room.  She does not want to go.  I discussed that we can try an antibiotic if she is not willing to go, but if her other symptoms persist or get worse she needs to be evaluated in person.  The patient agreed with the plan of trying an antibiotic and demonstrated an understanding of the instructions.   The patient was advised to call back or seek an in-person evaluation if the symptoms worsen or if the condition fails to improve as anticipated in which case she needs to be evaluated in the emergency room.  Timed code: Time spent on telephone call: 23 minutes   Binnie Rail, MD

## 2022-09-30 ENCOUNTER — Telehealth (INDEPENDENT_AMBULATORY_CARE_PROVIDER_SITE_OTHER): Payer: Medicare Other | Admitting: Internal Medicine

## 2022-09-30 DIAGNOSIS — I2089 Other forms of angina pectoris: Secondary | ICD-10-CM

## 2022-09-30 DIAGNOSIS — J189 Pneumonia, unspecified organism: Secondary | ICD-10-CM

## 2022-09-30 MED ORDER — DOXYCYCLINE HYCLATE 100 MG PO TABS: 100.0000 mg | ORAL_TABLET | Freq: Two times a day (BID) | ORAL | 0 refills | Status: AC

## 2022-10-04 IMAGING — CR DG CHEST 2V
2 series · 2 of 2 positions shown · non-contrast
Comparison: Previous studies including the examination of
09/07/2021

CLINICAL DATA: Difficulty breathing

EXAM:
CHEST - 2 VIEW

[chest lat]
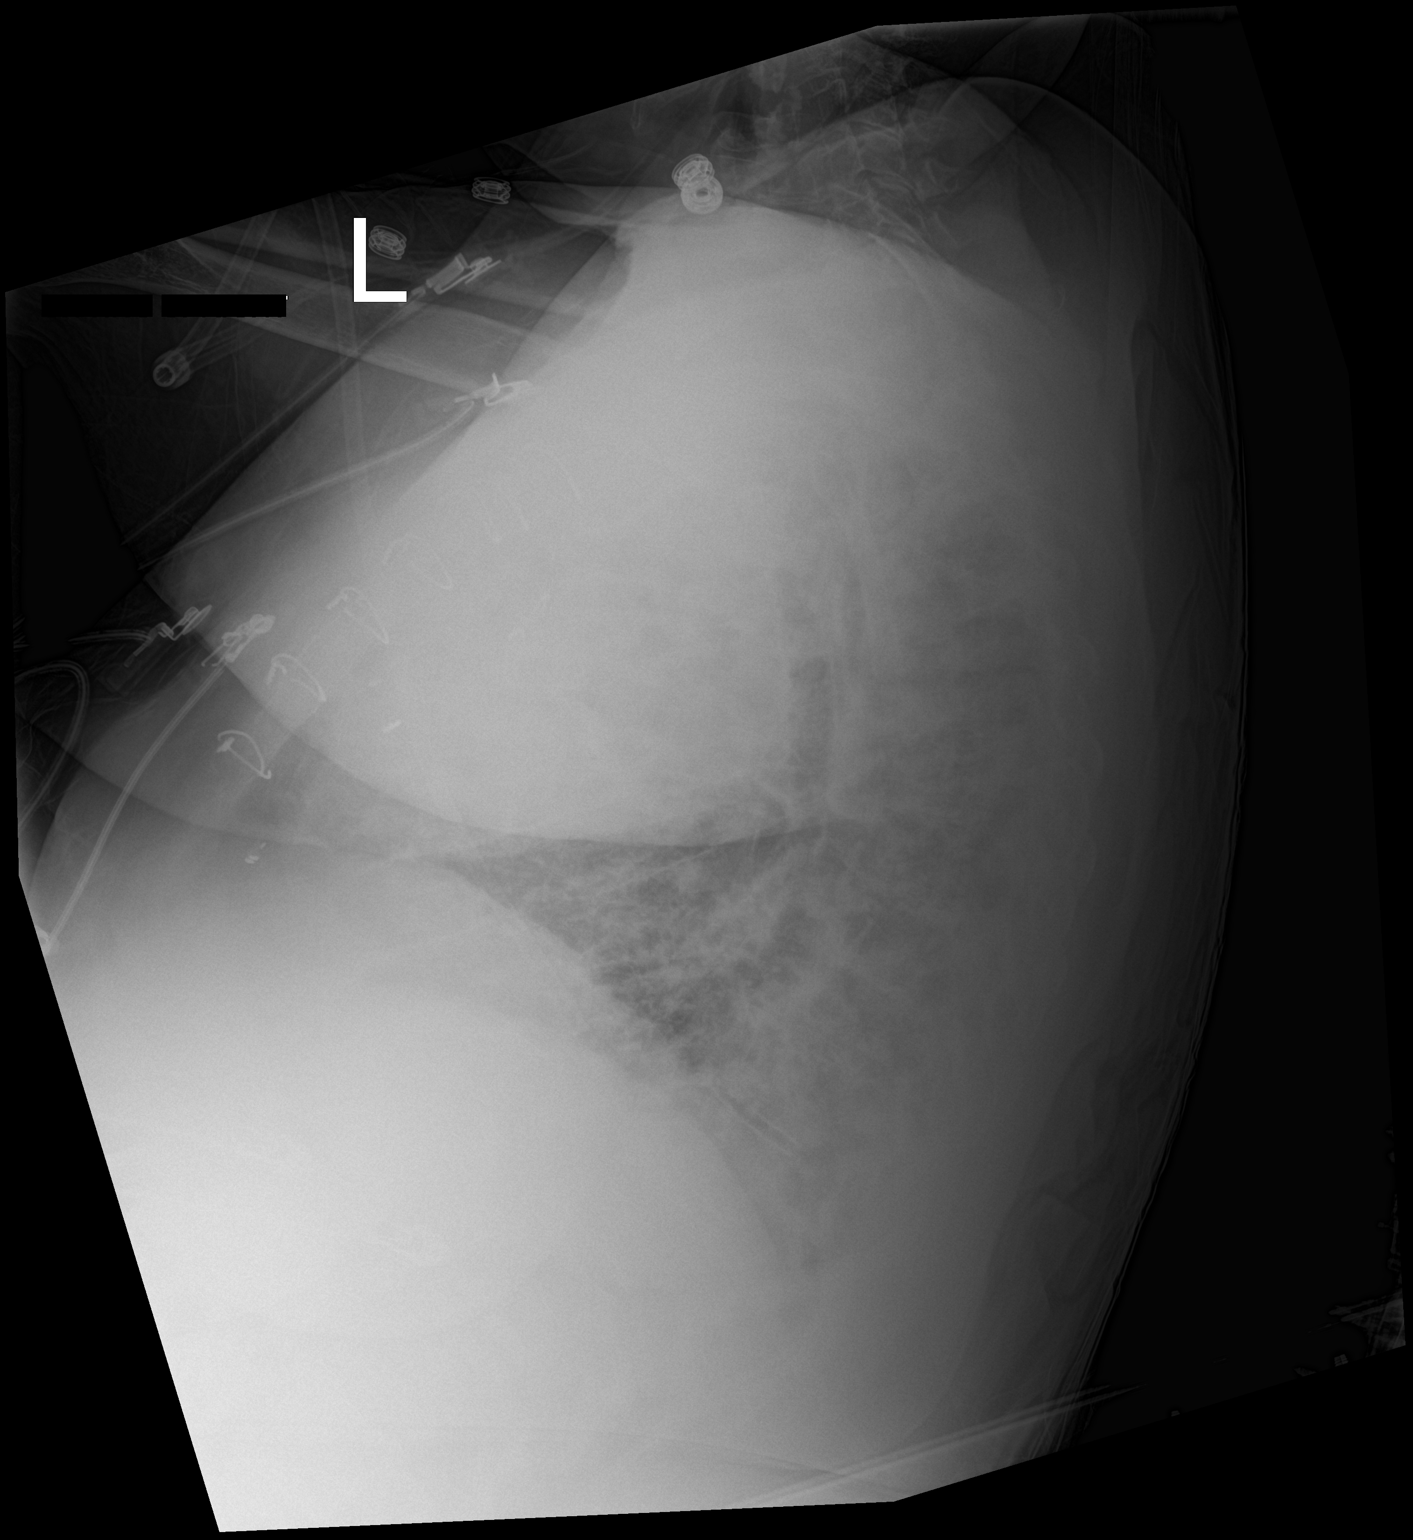

[chest ap]
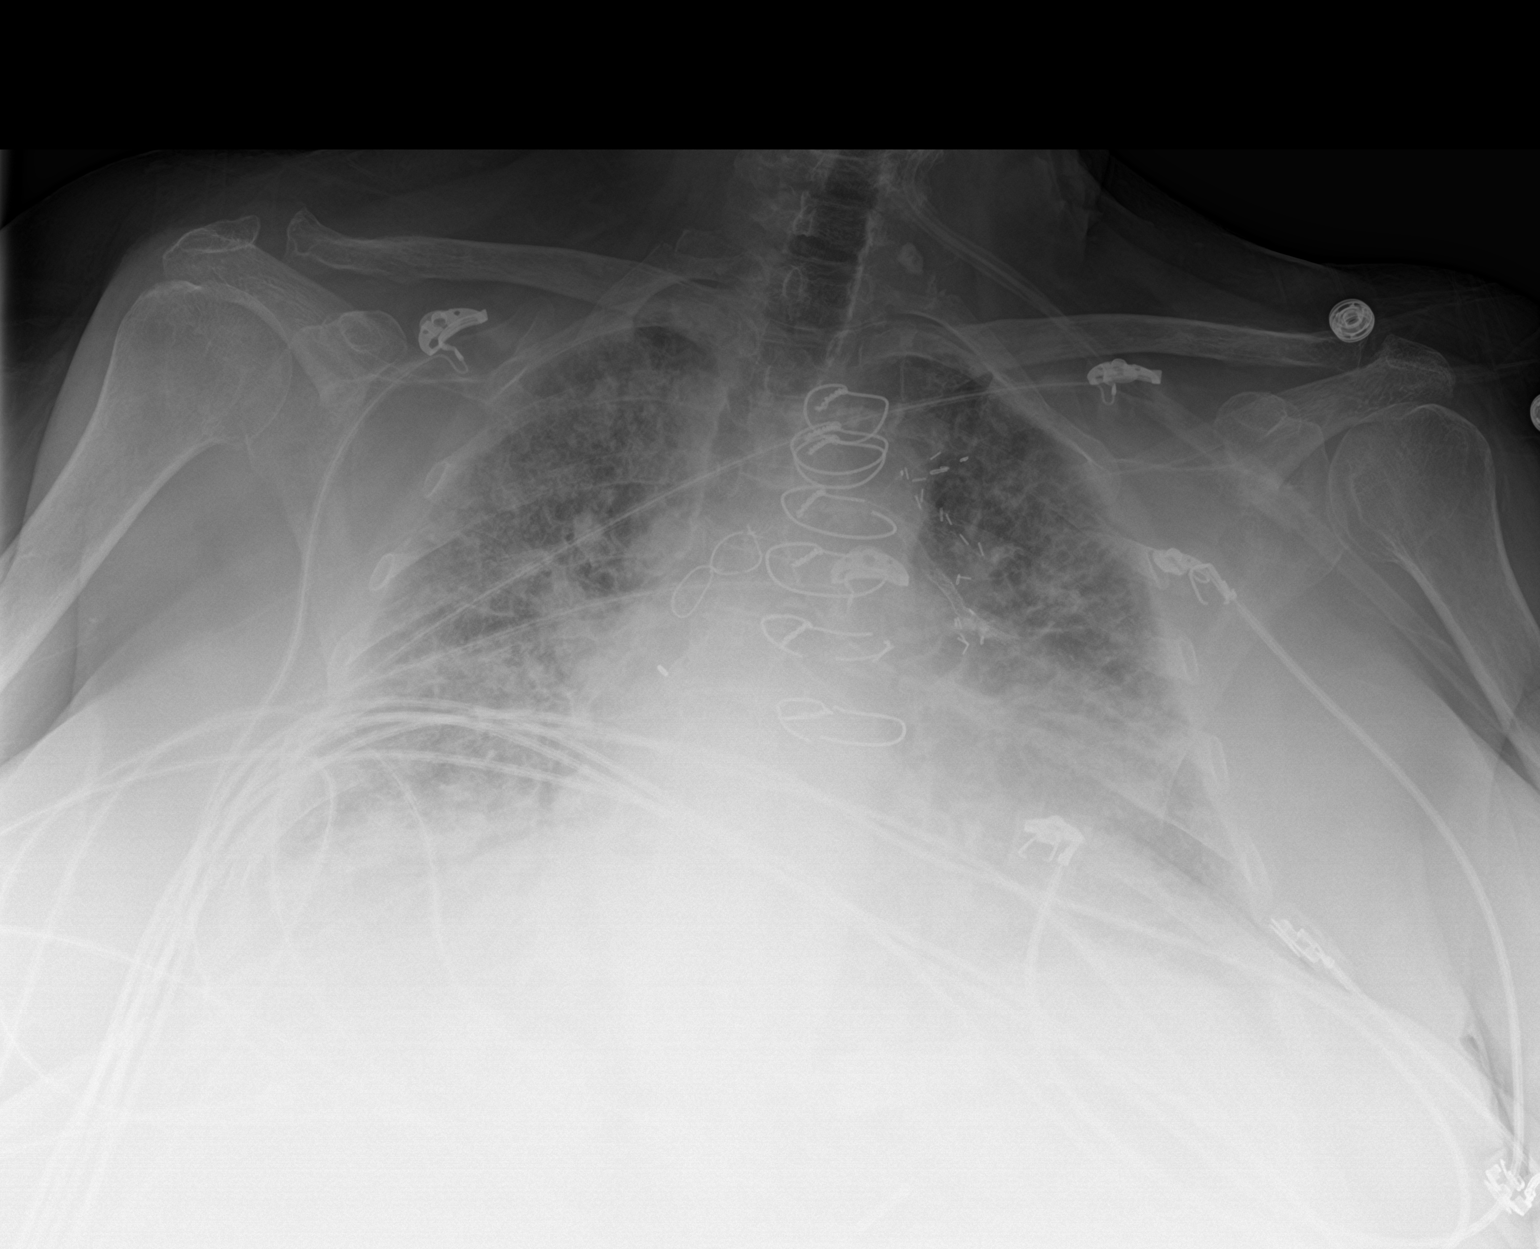

[2 of 2 positions shown; findings below may reference images not displayed]

FINDINGS: Transverse diameter of heart is increased. There is evidence of
previous coronary bypass surgery. Central pulmonary vessels are
prominent. There is poor inspiration. Increased interstitial
markings are seen in both lungs. There is minimal blunting of
lateral CP angles. There is no pneumothorax.
IMPRESSION: Cardiomegaly. Diffuse increase in interstitial markings in both
lungs may suggest pulmonary edema or interstitial pneumonia. Part of
this finding may suggest underlying scarring. Possible small
bilateral pleural effusions.

## 2022-10-05 ENCOUNTER — Telehealth: Payer: Self-pay | Admitting: Internal Medicine

## 2022-10-05 DIAGNOSIS — M4726 Other spondylosis with radiculopathy, lumbar region: Secondary | ICD-10-CM

## 2022-10-05 DIAGNOSIS — Z515 Encounter for palliative care: Secondary | ICD-10-CM

## 2022-10-05 DIAGNOSIS — M1711 Unilateral primary osteoarthritis, right knee: Secondary | ICD-10-CM

## 2022-10-05 DIAGNOSIS — M47816 Spondylosis without myelopathy or radiculopathy, lumbar region: Secondary | ICD-10-CM

## 2022-10-05 MED ORDER — OXYCODONE HCL 10 MG PO TABS: 10.0000 mg | ORAL_TABLET | Freq: Three times a day (TID) | ORAL | 0 refills | Status: DC | PRN

## 2022-10-05 NOTE — Telephone Encounter (Signed)
Caller & Relationship to patient:  self   Call back number:  [phone]   Date of last office visit:   Date of next office visit:  11/02/2022   Medication(s) to be refilled:  Oxycodone        Preferred Pharmacy:  [facility]

## 2022-10-13 ENCOUNTER — Telehealth: Payer: Self-pay | Admitting: Emergency Medicine

## 2022-10-13 ENCOUNTER — Institutional Professional Consult (permissible substitution): Payer: Medicaid Other | Admitting: Plastic Surgery

## 2022-10-13 NOTE — Telephone Encounter (Signed)
PT wanted Dr. Lamonte Sakai to know she is on a antibx prescribed by her PCP due to illness. Please call PT after letting Dr. Gwyndolyn Kaufman to see if he wants to see her.  Her # is [phone]

## 2022-10-13 NOTE — Telephone Encounter (Signed)
Notified patient via VOICEMAIL that when she is out of the ED that she needs to call and make follow up visit with Dr Lamonte Sakai or an APP. Nothing further needed

## 2022-10-13 NOTE — Telephone Encounter (Signed)
Thank you for letting me know.  Please set her up with an acute visit in about 2 weeks with APP to check on her status, improvement.

## 2022-10-13 NOTE — Telephone Encounter (Signed)
Called and spoke with patient. Patient stated that she finished her doxycycline and she doesn't feel any better and she is going to go ahead and go to the ER. She wanted to let  Dr. Lamonte Sakai know.   Routing to Dr. Lamonte Sakai as an Juluis Rainier.

## 2022-10-14 ENCOUNTER — Telehealth (INDEPENDENT_AMBULATORY_CARE_PROVIDER_SITE_OTHER): Payer: Medicare Other | Admitting: Internal Medicine

## 2022-10-14 ENCOUNTER — Ambulatory Visit: Payer: Medicaid Other | Admitting: Emergency Medicine

## 2022-10-14 ENCOUNTER — Encounter: Payer: Self-pay | Admitting: Internal Medicine

## 2022-10-14 DIAGNOSIS — J069 Acute upper respiratory infection, unspecified: Secondary | ICD-10-CM | POA: Diagnosis not present

## 2022-10-14 DIAGNOSIS — E785 Hyperlipidemia, unspecified: Secondary | ICD-10-CM | POA: Diagnosis not present

## 2022-10-14 DIAGNOSIS — J9611 Chronic respiratory failure with hypoxia: Secondary | ICD-10-CM | POA: Diagnosis not present

## 2022-10-14 DIAGNOSIS — E1169 Type 2 diabetes mellitus with other specified complication: Secondary | ICD-10-CM | POA: Diagnosis not present

## 2022-10-14 MED ORDER — AMOXICILLIN-POT CLAVULANATE 875-125 MG PO TABS: 1.0000 | ORAL_TABLET | Freq: Two times a day (BID) | ORAL | 0 refills | Status: DC

## 2022-10-14 NOTE — Assessment & Plan Note (Signed)
Better after recent doxy course, now worsening again especially severe bilat ear pain - for augmentin 1 bid x 10 days, mucinex prn, consider ENT if not improving

## 2022-10-14 NOTE — Assessment & Plan Note (Signed)
Lab Results  Component Value Date   HGBA1C 8.5 (H) 06/16/2022   Uncontrolled, pt has daily prednisone required,, pt to continue current medical treatment amaryl 4 jmg qd, novolog 4-12 u tid ac, lantus 30 u qd, metformin 500 bid - declines further change for now, will f/u with Dr Ronnald Ramp with A1c soon

## 2022-10-14 NOTE — Patient Instructions (Signed)
Please take all new medication as prescribed 

## 2022-10-14 NOTE — Assessment & Plan Note (Signed)
Stable overall, cont home o2 

## 2022-10-14 NOTE — Progress Notes (Signed)
Patient ID: Katherine [name], female   DOB: Sep 18, 1955, 67 y.o.   MRN: [id number]  Virtual Visit via Video Note  I connected with Katherine [name] on 10/14/22 at  3:00 PM EST by a video enabled telemedicine application and verified that I am speaking with the correct person using two identifiers.  Location of all participants today Patient: at home Provider: at office   I discussed the limitations of evaluation and management by telemedicine and the availability of in person appointments. The patient expressed understanding and agreed to proceed.  History of Present Illness:  Here with 2-3 days acute onset fever, facial pain, pressure, headache, general weakness and malaise, and greenish d/c, with mild ST and cough, but pt denies chest pain, wheezing, increased sob or doe, orthopnea, PND, increased LE swelling, palpitations, dizziness or syncope.   Pt denies polydipsia, polyuria, or new focal neuro s/s.   Has home o2 no change recently Past Medical History:  Diagnosis Date   Anemia    Anxiety    Bilateral carotid artery stenosis    Carotid duplex [id number]: [id number] LICA, [id number] RICA, [id number] RECA, f/u 1 yr suggested   CAD (coronary artery disease) of bypass graft 5/01; 3/'02, 8/'03, 10/'04; 1/15   PCI x 5 to SVG-D1    CAD in native artery 07/1993   3 Vessel Disease (LAD-D1 & RCA) -- CABG (Dx in setting of inferior STEMI-PTCA of RCA)   COPD mixed type ([place])    Followed by Dr. Lamonte Sakai "pulmonologist said no COPD"   Depression with anxiety    Diabetes mellitus type 2 in obese ([place])    Diarrhea    started after cholecystectomy and mass removed from intestine   Dyslipidemia, goal LDL below 70    08/2012: TC 137, TG 200, HDL 32!, LDL 45; on statin (followed by Dr.Deterding)   ESRD (end stage renal disease) ([place]) 1991   s/p Cadaveric Renal Transplant [facility] - Dr. Jimmy Footman)    Family history of adverse reaction to anesthesia    mom's bp dropped during/after anesthesia   Fibromyalgia    GERD  (gastroesophageal reflux disease)    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 07/1993   H/O: GI bleed    History of kidney stones    History of stroke 2012   "right eye stroke- half blind now"   History of [place] de pointe due to drug 05/11/2021   Witnessed syncopal event.  Had having having lots of nausea and vomiting with poor p.o. intake.  Thought to have QT prolongation with multiple medications involved and hypomagnesemia, hypokalemia.  Tikosyn discontinued along with Zoloft and Phenergan.   Hypertension associated with diabetes ([place])    Mild aortic stenosis by prior echocardiogram 07/2019   Echo:  Mild aortic stenosis (gradients: Mean 14.3 mmHg -peak 24.9 mmHg).   Morbid obesity ([facility])    MRSA (methicillin resistant staph aureus) culture positive    OSA (obstructive sleep apnea)    no longer on CPAP or home O2, states she doesn't need now after lap band   PAD (peripheral artery disease) ([place]) 08/2013   LEA Dopplers to be read by Dr. Fletcher Anon   PAF (paroxysmal atrial fibrillation) ([place]) 06/2014   Noted on CardioNet Monitor  - --> rhythm control with Tikosyn (Dr. Rayann Heman); converted from warfarin to apixaban for anticoagulation.   Pneumonia    Recurrent boils    Bilateral Groin   Rheumatoid arthritis ([place])    Per Patient Report; associated with OA  S/p cadaver renal transplant [place]   Past Surgical History:  Procedure Laterality Date   ABDOMINAL AORTOGRAM N/A 04/21/2018   Procedure: ABDOMINAL AORTOGRAM;  Surgeon: [name], MD;  Location: [place] [facility];  Service: Cardiovascular;  Laterality: N/A;   CATHETER REMOVAL     CHOLECYSTECTOMY N/A 10/29/2014   Procedure: LAPAROSCOPIC CHOLECYSTECTOMY WITH INTRAOPERATIVE CHOLANGIOGRAM;  Surgeon: Excell Seltzer, MD;  Location: [facility];  Service: General;  Laterality: N/A;   CORONARY ANGIOPLASTY  1994   x5   CORONARY ARTERY BYPASS GRAFT  1995   LIMA-LAD, SVG-RPDA, SVG-D1   CORONARY STENT  INTERVENTION N/A 02/20/2021   Procedure: PERCUTANEOUS CORONARY STENT INTERVENTION;  Surgeon: [name], MD;  Location: [place] [facility]; ostLCx 60% (Neg RFR 0.96);; SVG- D2 recurrent 90% ISR & 95% native D2 after graft-> DES PCI of 95% anastomotic D2 lesion (Onyx Frontier 2.25 mm x 12 mm => 2.75 mm @ overlap, 2.5 distal.);PTCA of ISR in body of graft. ->  2.5 mm scoring balloon & post-dil w/ 2.75 mm [place] balloon   ESOPHAGOGASTRODUODENOSCOPY N/A 10/15/2016   Procedure: ESOPHAGOGASTRODUODENOSCOPY (EGD);  Surgeon: Wilford Corner, MD;  Location: [facility] ENDOSCOPY;  Service: Endoscopy;  Laterality: N/A;   I & D EXTREMITY Right 01/29/2018   Procedure: IRRIGATION AND DEBRIDEMENT THUMB;  Surgeon: Dayna Barker, MD;  Location: [place];  Service: Plastics;  Laterality: Right;   INCISE AND DRAIN ABCESS     INTRAVASCULAR PRESSURE WIRE/FFR STUDY N/A 02/20/2021   Procedure: INTRAVASCULAR PRESSURE WIRE/FFR STUDY;  Surgeon: [name], MD;  Location: [place] [facility];  Service: Cardiovascular;  Laterality: N/A;   KIDNEY TRANSPLANT  1991   KNEE ARTHROSCOPY WITH LATERAL MENISECTOMY Left 12/03/2017   Procedure: LEFT KNEE ARTHROSCOPY WITH LATERAL MENISECTOMY;  Surgeon: Earlie Server, MD;  Location: [place];  Service: Orthopedics;  Laterality: Left;   LAPAROSCOPIC GASTRIC BANDING  04/2004; 10/'09, 2/'10   Port Replacement x 2   LEFT HEART CATH AND CORONARY ANGIOGRAPHY N/A 02/20/2021   Procedure: LEFT HEART CATH AND CORONARY ANGIOGRAPHY;  Surgeon: [name], MD;  Location: [place] [facility];  Service: Cardiovascular;  Laterality: N/A;   LEFT HEART CATH AND CORS/GRAFTS ANGIOGRAPHY N/A 04/21/2018   Procedure: LEFT HEART CATH AND CORS/GRAFTS ANGIOGRAPHY;  Surgeon: [name], MD;  Location: [place] [facility];  Ost-Prox LAD 50% - proxLAD (pre & post D1) 100% CTO. Cx - patent, small OM1 (stable ~ ostial OM1 90%, too small for PCI) & 2 small LPL; Ost-distal RCA 100% CTO.  LIMA-LAD (not injected);  SVG-dRCA patent, SVG-D1 - insertion stent ~20% ISR - Severe R CFA disease w/ focal Sub TO   LEFT HEART CATH AND CORS/GRAFTS ANGIOGRAPHY  5/'01, 3/'02, 8/'03, 10/'04; 1/'15   08/22/2013: LAD & RCA 100%; LIMA-LAD & SVG-rPDA patent; Cx-- OM1 60%, OM2 ostial ~50%; SVG-D1 - 80% mid, 50% distal ISR --PCI   LEFT HEART CATH AND CORS/GRAFTS ANGIOGRAPHY N/A 01/31/2021   Procedure: LEFT HEART CATH AND CORS/GRAFTS ANGIOGRAPHY;  Surgeon: Jolaine Artist, MD;  Location: [place] [facility];;   LEFT HEART CATH AND CORS/GRAFTS ANGIOGRAPHY N/A 09/05/2021   Procedure: LEFT HEART CATH AND CORS/GRAFTS ANGIOGRAPHY;  Surgeon: [name], MD;  Location: [place] [facility];  Service: Cardiovascular;  Laterality: N/A;   LEFT HEART CATH AND CORS/GRAFTS ANGIOGRAPHY N/A 08/21/2021   Procedure: LEFT HEART CATH AND CORS/GRAFTS ANGIOGRAPHY;  Surgeon: Belva Crome, MD;  Location: [place] [facility];  Service: Cardiovascular;  Laterality: N/A;   LEFT HEART CATHETERIZATION WITH CORONARY/GRAFT ANGIOGRAM N/A 08/23/2013   Procedure: LEFT HEART CATHETERIZATION WITH Beatrix Fetters;  Surgeon: Wellington Hampshire, MD;  Location: [place] CATH LAB;  Service: Cardiovascular;  Laterality: N/A;   Lower Extremity Arterial Dopplers  08/2013   ABI: R 0.96, L 1.04   MULTIPLE TOOTH EXTRACTIONS  age 51   NM MYOVIEW LTD  03/2016   EF 62%. LOW RISK. C/W prior MI - no Ischemia. Apical hypokinesis.   PERCUTANEOUS CORONARY STENT INTERVENTION (PCI-S)  5/'01, 3/'02, 8/'03, 10/'04;   '01 - S660 BMS 2.5 x 9 - dSVG-D1 into D1; '02- post-stent stenosis - 2.5 x 8 Pixel BMS; '8\03: ISR/Thrombosis into native D1 - AngioJet, 2.5 x 13 Pixel; '04 - ISR 95% - covered stented area with Taxus DES 2.5 mm x 20 (2.88)   PERCUTANEOUS CORONARY STENT INTERVENTION (PCI-S)  08/23/2013   Procedure: PERCUTANEOUS CORONARY STENT INTERVENTION (PCI-S);  Surgeon: Wellington Hampshire, MD;  Location: [facility] CATH LAB;  Service: Cardiovascular;;mid SVG-D1 80%; distal stent ~50% ISR;  Promus Prermier DES 2.75 mm xc 20 mm (2.8 mm)   PORT-A-CATH REMOVAL     kidney   TRANSTHORACIC ECHOCARDIOGRAM  07/2019   a) 07/2019: EF 55 to 60%.  No LVH.  Paradoxical septal WM-s/p CABG.  GRII DD.  Nl RV size and fxn.  Mild bilateral atrial dilation.  Mod MAC.  Trace MR.  Mild AS (gradients: Mean 14.3 mmHg -peak 24.9 mmHg).; B) 06/2020: EF 40 to 45%.  Moderate concentric LVH.  GRII DD.  Elevated LAP.  Mod HK mid Apical Ant-AntSept wall & mild Apical Dyskinesis.  Mod LA dilation.  Mild MR.  AoV sclerosis w/o AS.   TRANSTHORACIC ECHOCARDIOGRAM  01/30/2021   EF 55 to 60%.  Mild LVH.  GR 1 DD.  Elevated LAP.  Moderate LA dilation.  Mild MR with mild MS.  Mild aortic valve stenosis.:   TUBAL LIGATION     wrist fistula repair Left    dialysis for one year    reports that she quit smoking about 20 years ago. Her smoking use included cigarettes. She has a 30.00 pack-year smoking history. She has never used smokeless tobacco. She reports that she does not drink alcohol and does not use drugs. family history includes Arrhythmia in her brother and paternal aunt; Cancer in her father and mother; Heart disease in her father. Allergies  Allergen Reactions   Tetracycline Hives    Patient tolerated Doxycycline Dec 2020   Niacin Other (See Comments)    Mouth blisters   Niaspan [Niacin Er] Other (See Comments)    Mouth blisters   Sulfa Antibiotics Nausea Only and Other (See Comments)    "Tears up stomach"   Sulfonamide Derivatives Other (See Comments)    Reaction: per patient "tears her stomach up"   Codeine Nausea And Vomiting   Erythromycin Nausea And Vomiting   Hydromorphone Hcl Nausea And Vomiting   Morphine And Related Nausea And Vomiting   Nalbuphine Nausea And Vomiting    Nubain   Sulfasalazine Nausea Only and Other (See Comments)    per patient "tears her stomach up", "Tears up stomach"   Tape Rash and Other (See Comments)    No "plastic" tape," please----cloth tape only   Current  Outpatient Medications on File Prior to Visit  Medication Sig Dispense Refill   albuterol (VENTOLIN HFA) 108 (90 Base) MCG/ACT inhaler TAKE 2 PUFFS BY MOUTH EVERY 6 HOURS AS NEEDED FOR WHEEZE OR SHORTNESS OF  BREATH 18 each 4   arformoterol (BROVANA) 15 MCG/2ML NEBU Take 2 mLs (15 mcg total) by nebulization 2 (two) times daily. 120 mL 12   azaTHIOprine (IMURAN) 50 MG tablet Take 2 & 1/2 tablets (125 mg) by mouth daily at 3pm (Patient taking differently: Take 125 mg by mouth daily.) 75 tablet 2   bisacodyl (DULCOLAX) 5 MG EC tablet Take 1 tablet (5 mg total) by mouth daily as needed for moderate constipation. 30 tablet 0   budesonide (PULMICORT) 0.5 MG/2ML nebulizer solution Take 2 mLs (0.5 mg total) by nebulization 2 (two) times daily. (Patient taking differently: Take 2 mLs by nebulization 2 (two) times daily as needed (wheezing).) 360 mL 1   calcitRIOL (ROCALTROL) 0.25 MCG capsule Take 0.25 mcg by mouth See admin instructions. Takes every 3 days.     cariprazine (VRAYLAR) 1.5 MG capsule Take 1 capsule (1.5 mg total) by mouth daily. 90 capsule 1   clopidogrel (PLAVIX) 75 MG tablet Take 75 mg by mouth daily.     Continuous Blood Gluc Receiver (FREESTYLE LIBRE 2 READER) DEVI 1 Act by Does not apply route daily. 2 each 5   Continuous Blood Gluc Sensor (FREESTYLE LIBRE 2 SENSOR) MISC 1 Act by Does not apply route daily. 2 each 5   [employer] VITAMIN C 500 MG tablet TAKE 1 TABLET (500 MG TOTAL) BY MOUTH DAILY. 30 tablet 1   cyclobenzaprine (FLEXERIL) 10 MG tablet TAKE 1 TABLET BY MOUTH TWICE A DAY AS NEEDED FOR MUSCLE SPASMS 180 tablet 0   ELIQUIS 5 MG TABS tablet TAKE 1 TABLET BY MOUTH TWICE A DAY 60 tablet 2   Ensure Max Protein (ENSURE MAX PROTEIN) LIQD Take 330 mLs (11 oz total) by mouth 2 (two) times daily.     fluticasone (FLONASE) 50 MCG/ACT nasal spray PLACE 2 SPRAYS INTO BOTH NOSTRILS 2 TIMES DAILY. 48 mL 1   furosemide (LASIX) 40 MG tablet TAKE 1 TABLET BY MOUTH EVERY DAY 90 tablet 0   gabapentin  (NEURONTIN) 100 MG capsule Take 1 capsule (100 mg total) by mouth at bedtime. 30 capsule 1   glimepiride (AMARYL) 4 MG tablet Take 4 mg by mouth daily with breakfast.     insulin aspart (NOVOLOG FLEXPEN) 100 UNIT/ML FlexPen Inject 4-12 Units into the skin 3 (three) times daily with meals. Sliding scale.     Insulin Glargine (BASAGLAR KWIKPEN) 100 UNIT/ML Inject 30 Units into the skin daily. 27 mL 1   Insulin Pen Needle (PEN NEEDLES) 32G X 4 MM MISC Use as needed to administer insulin. E11.9 100 each 1   isosorbide mononitrate (IMDUR) 60 MG 24 hr tablet TAKE 1 TABLET BY MOUTH EVERY DAY 90 tablet 3   KLOR-CON M10 10 MEQ tablet TAKE 1 TABLET BY MOUTH 2 TIMES DAILY. 180 tablet 0   lamoTRIgine (LAMICTAL) 25 MG tablet TAKE 1 TABLET BY MOUTH EVERYDAY AT BEDTIME 93 tablet 1   LORazepam (ATIVAN) 1 MG tablet Take 1 tablet (1 mg total) by mouth every 8 (eight) hours as needed for anxiety. 60 tablet 5   metFORMIN (GLUCOPHAGE) 500 MG tablet Take 500 mg by mouth 2 (two) times daily.     metoprolol tartrate (LOPRESSOR) 25 MG tablet TAKE 1 TABLET BY MOUTH THREE TIMES A DAY 270 tablet 0   montelukast (SINGULAIR) 10 MG tablet TAKE 1 TABLET BY MOUTH EVERYDAY AT BEDTIME 90 tablet 3   Multiple Vitamin (MULTIVITAMIN WITH MINERALS) TABS tablet Take 1 tablet by mouth daily.  nitroGLYCERIN (NITROSTAT) 0.4 MG SL tablet PLACE 1 TAB UNDER TONGUE EVERY 5MINS FOR CHEST PAIN 75 tablet 6   Oxycodone HCl 10 MG TABS Take 1 tablet (10 mg total) by mouth every 8 (eight) hours as needed. 90 tablet 0   OXYGEN Inhale 4 L into the lungs continuous.     pantoprazole (PROTONIX) 40 MG tablet TAKE 1 TABLET BY MOUTH EVERY DAY 90 tablet 1   predniSONE (DELTASONE) 5 MG tablet Take 5 mg by mouth daily with breakfast.     promethazine (PHENERGAN) 25 MG tablet Take 1 tablet (25 mg total) by mouth every 6 (six) hours as needed for nausea or vomiting. 30 tablet 0   ranolazine (RANEXA) 1000 MG SR tablet TAKE 1 TABLET BY MOUTH TWICE A DAY 180  tablet 0   rosuvastatin (CRESTOR) 20 MG tablet TAKE 1 TABLET BY MOUTH EVERY DAY 90 tablet 1   sertraline (ZOLOFT) 100 MG tablet Take 2 tablets (200 mg total) by mouth daily. 180 tablet 1   No current facility-administered medications on file prior to visit.    Observations/Objective: Alert, NAD, appropriate mood and affect, resps normal, cn 2-12 intact, moves all 4s, no visible rash or swelling Lab Results  Component Value Date   WBC 11.2 (H) 02/02/2022   HGB 12.2 02/02/2022   HCT 37.0 02/02/2022   PLT 234.0 02/02/2022   GLUCOSE 193 (H) 06/16/2022   CHOL 127 02/02/2022   TRIG 186.0 (H) 02/02/2022   HDL 66.80 02/02/2022   LDLCALC 23 02/02/2022   ALT 7 02/02/2022   AST 12 02/02/2022   NA 134 (L) 06/16/2022   K 3.1 (L) 06/16/2022   CL 91 (L) 06/16/2022   CREATININE 0.92 06/16/2022   BUN 13 06/16/2022   CO2 33 (H) 06/16/2022   TSH 1.872 10/19/2021   INR 1.2 01/31/2021   HGBA1C 8.5 (H) 06/16/2022   MICROALBUR <0.7 06/16/2022   Assessment and Plan: See notes  Follow Up Instructions: See notes   I discussed the assessment and treatment plan with the patient. The patient was provided an opportunity to ask questions and all were answered. The patient agreed with the plan and demonstrated an understanding of the instructions.   The patient was advised to call back or seek an in-person evaluation if the symptoms worsen or if the condition fails to improve as anticipated.Cathlean Cower, MD

## 2022-10-17 IMAGING — DX DG CHEST 1V PORT
1 series · 1 of 1 positions shown · non-contrast
Comparison: 09/23/2021

CLINICAL DATA: Short of breath.

EXAM:
PORTABLE CHEST 1 VIEW

[chest]
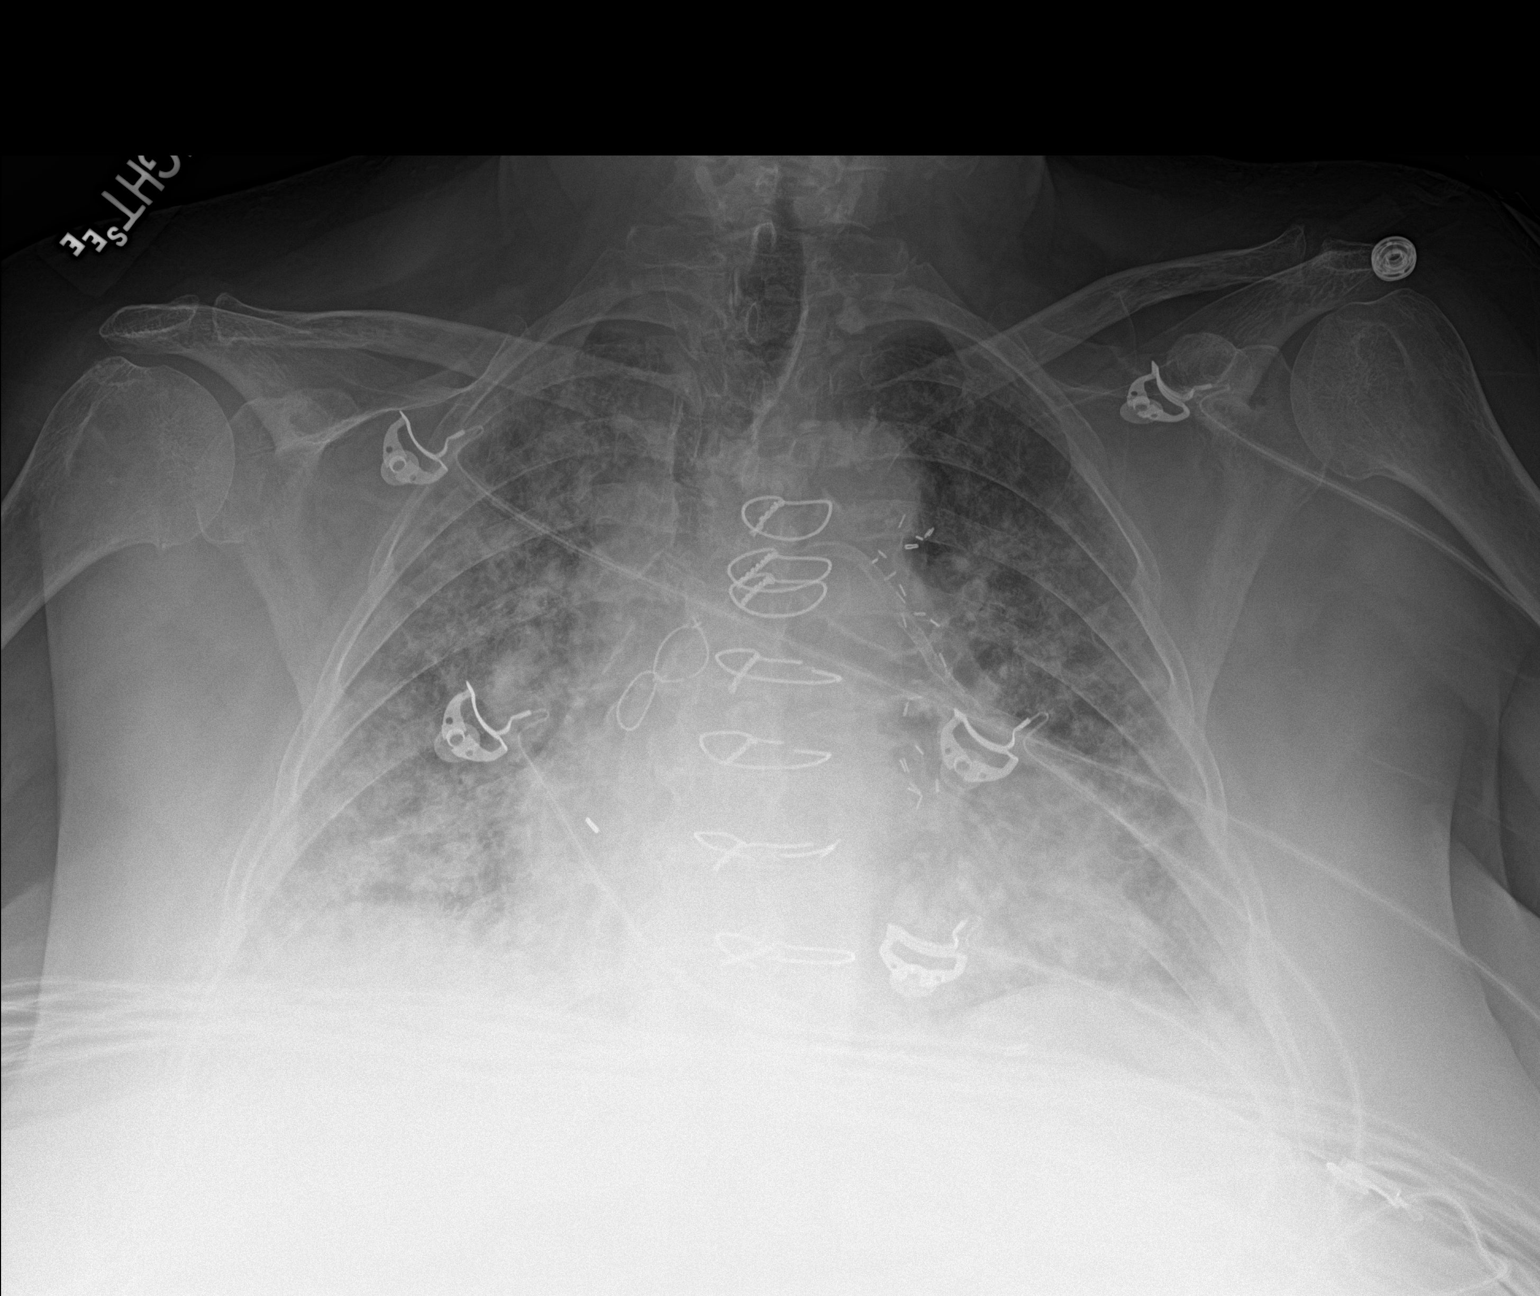

[1 of 1 positions shown; findings below may reference images not displayed]

FINDINGS: Previous median sternotomy and CABG procedure. Stable
cardiomediastinal contours. Interval increase in bilateral
interstitial and airspace opacities. No acute osseous findings.
IMPRESSION: Worsening aeration to the lungs bilaterally compatible with
progressive pulmonary edema versus multifocal infection.

## 2022-10-22 ENCOUNTER — Other Ambulatory Visit: Payer: Self-pay | Admitting: Internal Medicine

## 2022-10-26 ENCOUNTER — Telehealth: Payer: Self-pay | Admitting: Internal Medicine

## 2022-10-26 ENCOUNTER — Other Ambulatory Visit: Payer: Self-pay | Admitting: Internal Medicine

## 2022-10-26 DIAGNOSIS — I152 Hypertension secondary to endocrine disorders: Secondary | ICD-10-CM

## 2022-10-26 DIAGNOSIS — I10 Essential (primary) hypertension: Secondary | ICD-10-CM

## 2022-10-26 DIAGNOSIS — I2510 Atherosclerotic heart disease of native coronary artery without angina pectoris: Secondary | ICD-10-CM

## 2022-10-26 MED ORDER — METOPROLOL TARTRATE 25 MG PO TABS: 25.0000 mg | ORAL_TABLET | Freq: Three times a day (TID) | ORAL | 0 refills | Status: DC

## 2022-10-26 NOTE — Telephone Encounter (Signed)
Prescription Request  10/26/2022  LOV: 06/16/2022  What is the name of the medication or equipment? metoprolol tartrate (LOPRESSOR) 25 MG tablet   Have you contacted your pharmacy to request a refill? Yes   Which pharmacy would you like this sent to?  [id number] [place] [address]Phone:[phone]   Patient notified that their request is being sent to the clinical staff for review and that they should receive a response within 2 business days.   Please advise at Mobile [phone](mobile)

## 2022-10-28 ENCOUNTER — Institutional Professional Consult (permissible substitution): Payer: Medicaid Other | Admitting: Plastic Surgery

## 2022-10-29 ENCOUNTER — Telehealth: Payer: Self-pay | Admitting: Internal Medicine

## 2022-10-29 NOTE — Telephone Encounter (Signed)
Patient requesting in home nurse. She has appointment scheduled for 11/02/22, but her brother/caregiver has been hospitalized in the ICU. She doesn't know if she'll be able to make the appointment she has scheduled. She would like a call back at [phone] to figure out a plan.

## 2022-10-31 ENCOUNTER — Other Ambulatory Visit: Payer: Self-pay | Admitting: Internal Medicine

## 2022-10-31 DIAGNOSIS — I2510 Atherosclerotic heart disease of native coronary artery without angina pectoris: Secondary | ICD-10-CM

## 2022-11-01 IMAGING — DX DG CHEST 1V PORT
1 series · 1 of 1 positions shown · non-contrast
Comparison: Radiograph 10/03/2021, CT 08/05/2021

CLINICAL DATA: Worsening shortness of breath today.

EXAM:
PORTABLE CHEST 1 VIEW

[chest]
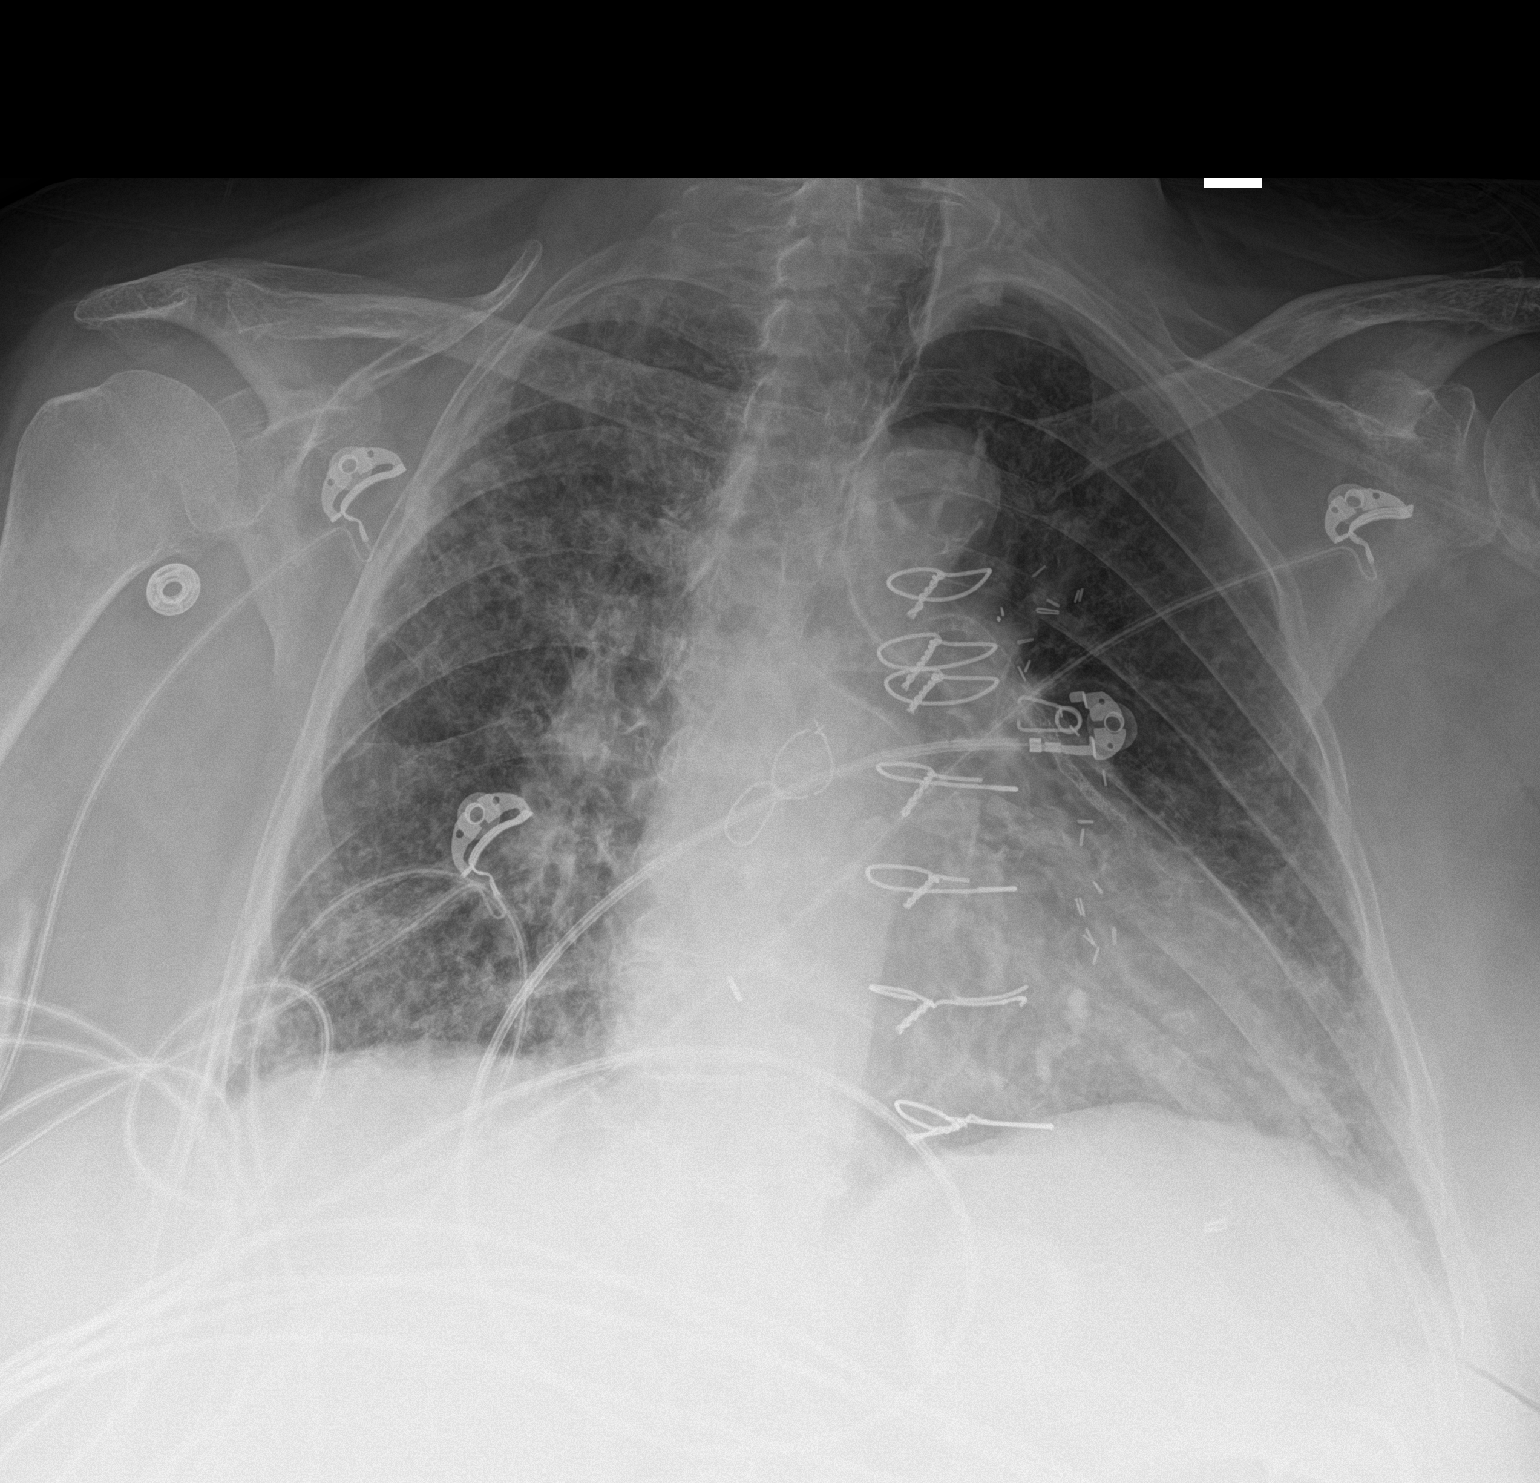

[1 of 1 positions shown; findings below may reference images not displayed]

FINDINGS: Significant patient rotation. Post median sternotomy and CABG.
Cardiomegaly stable. There are persistent bilateral heterogeneous
pulmonary opacities with slight improvement from prior exam. No new
or acute airspace disease. No pleural effusion or pneumothorax. No
acute osseous findings.
IMPRESSION: 1. Persistent bilateral heterogeneous pulmonary opacities with
slight improvement from prior exam. This may represent multifocal
pneumonia, scarring, or pulmonary edema.
2. Stable cardiomegaly.

## 2022-11-02 ENCOUNTER — Other Ambulatory Visit: Payer: Self-pay | Admitting: Internal Medicine

## 2022-11-02 ENCOUNTER — Ambulatory Visit: Payer: Medicare Other | Admitting: Internal Medicine

## 2022-11-02 ENCOUNTER — Telehealth: Payer: Self-pay | Admitting: Internal Medicine

## 2022-11-02 IMAGING — CT CT PELVIS W/O CM
2 of 5 series · 16 of 46 positions shown, 18 images · non-contrast
Comparison: CT Abdomen and Pelvis 04/19/2021.

CLINICAL DATA: 65-year-old female with possible right buttock
infection.

EXAM:
CT PELVIS WITHOUT CONTRAST
TECHNIQUE: Multidetector CT imaging of the pelvis was performed following the
standard protocol without intravenous contrast.
RADIATION DOSE REDUCTION: This exam was performed according to the
departmental dose-optimization program which includes automated
exposure control, adjustment of the mA and/or kV according to
patient size and/or use of iterative reconstruction technique.

[Series 3: soft tissue · axial · 0.96mm/px · z∈[-1244,-974]mm · 13 of 62 slices shown, 15 images]
[im 4/62  soft-tissue]
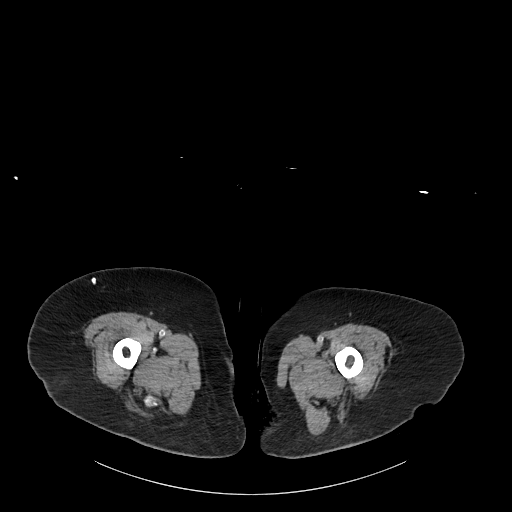
[im 4/62  bone]
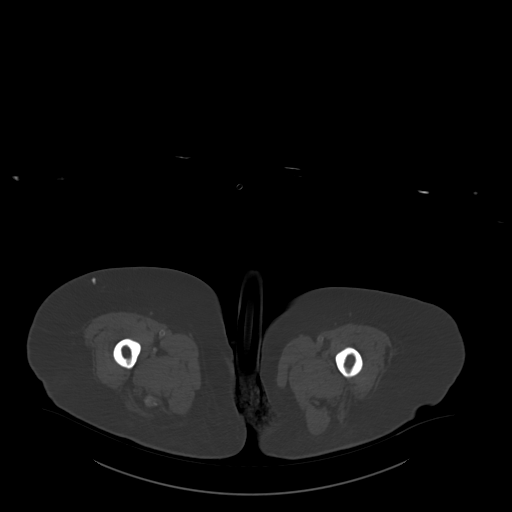
[im 8/62  soft-tissue]
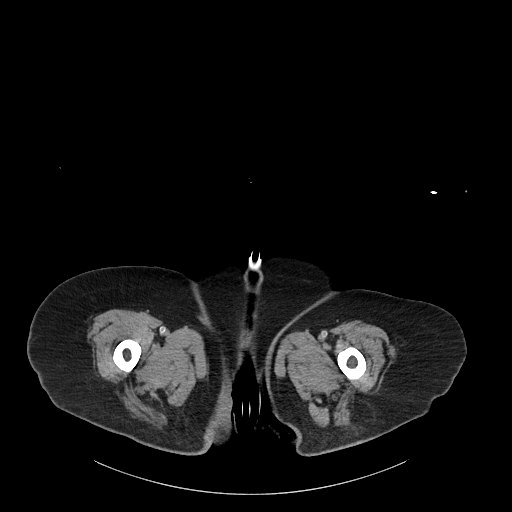
[im 12/62  soft-tissue]
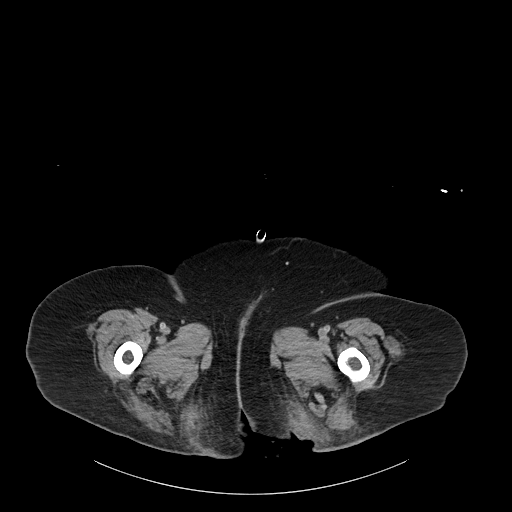
[im 18/62  soft-tissue]
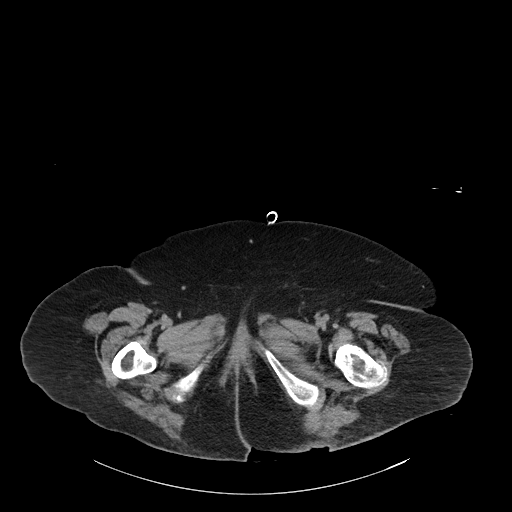
[im 22/62  soft-tissue]
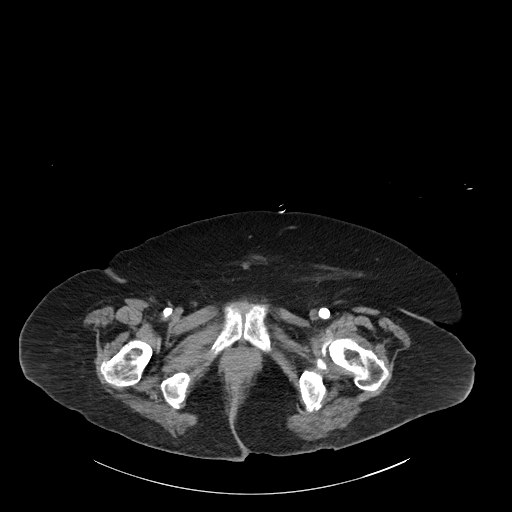
[im 26/62  soft-tissue]
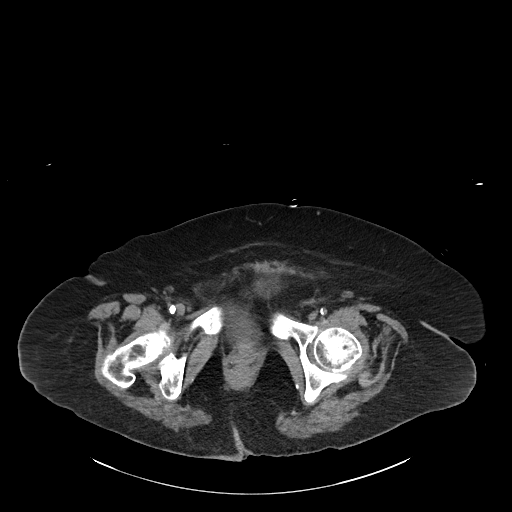
[im 32/62  soft-tissue]
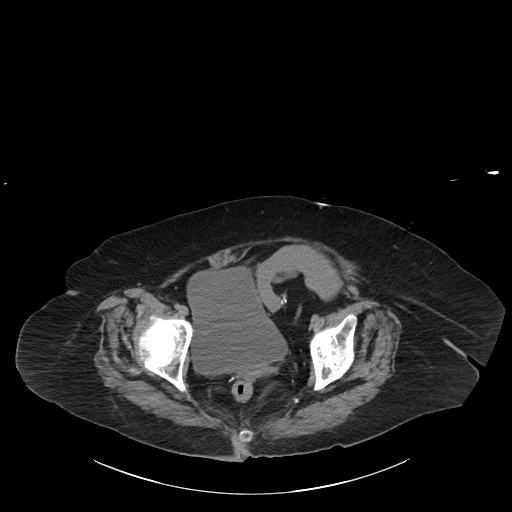
[im 36/62  soft-tissue]
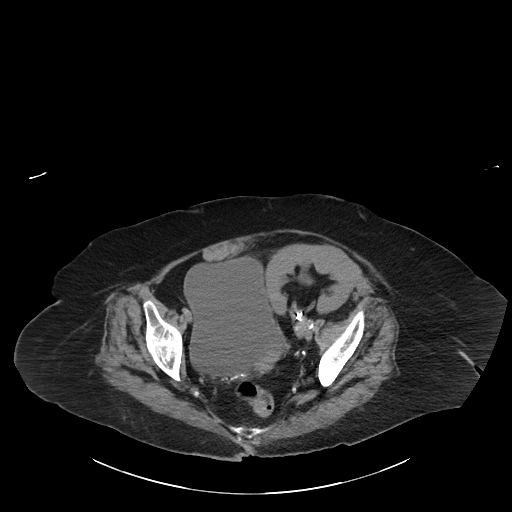
[im 40/62  soft-tissue]
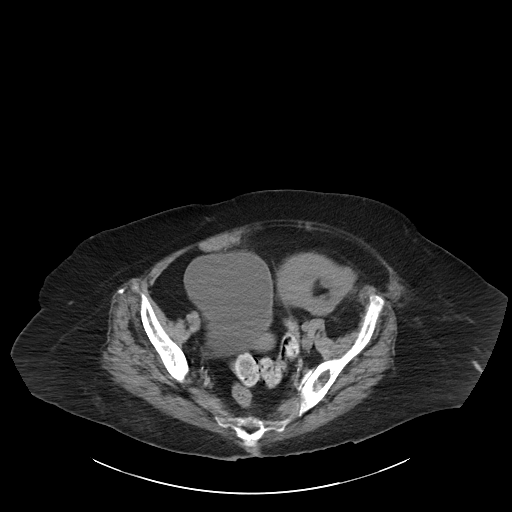
[im 40/62  bone]
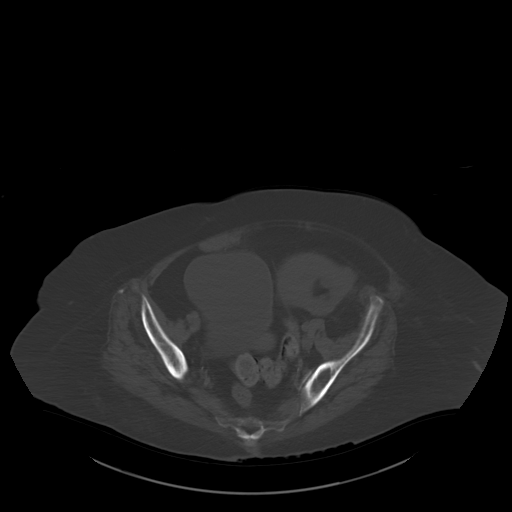
[im 44/62  soft-tissue]
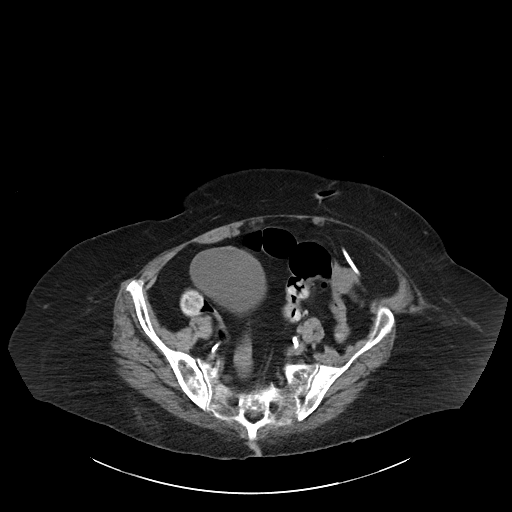
[im 50/62  soft-tissue]
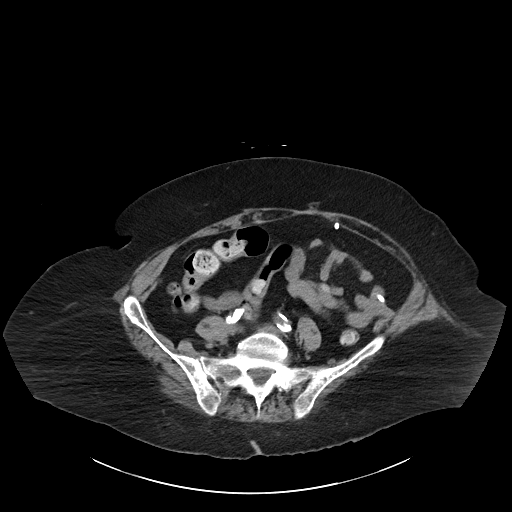
[im 54/62  soft-tissue]
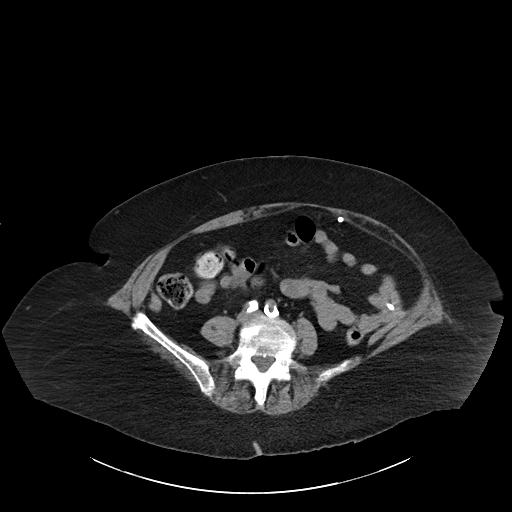
[im 58/62  soft-tissue]
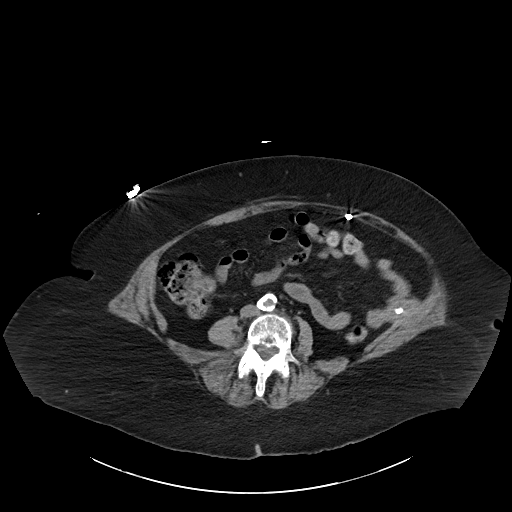

[Series 7: cor st · coronal · 0.68mm/px · 3 of 147 slices shown]
[im 37/147  soft-tissue]
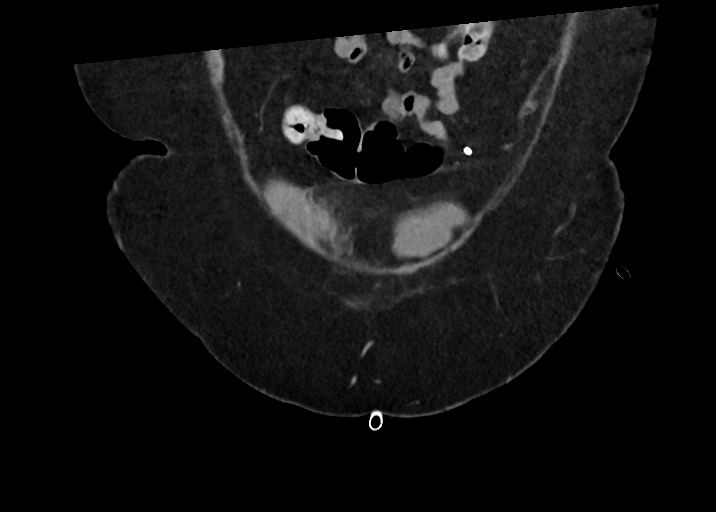
[im 74/147  soft-tissue]
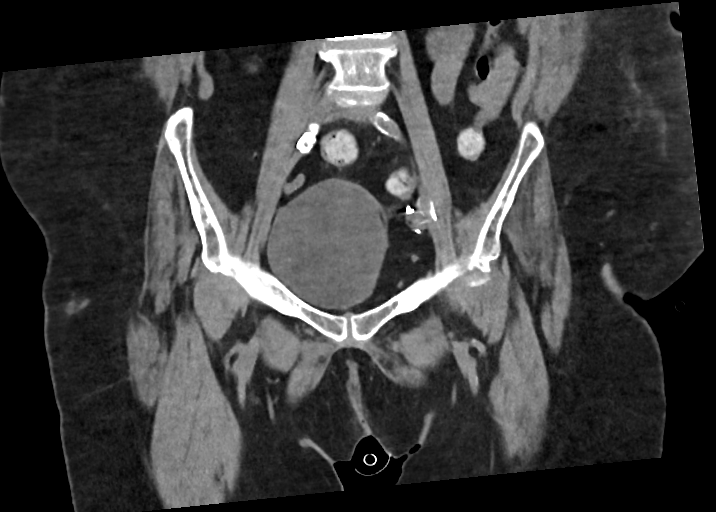
[im 110/147  soft-tissue]
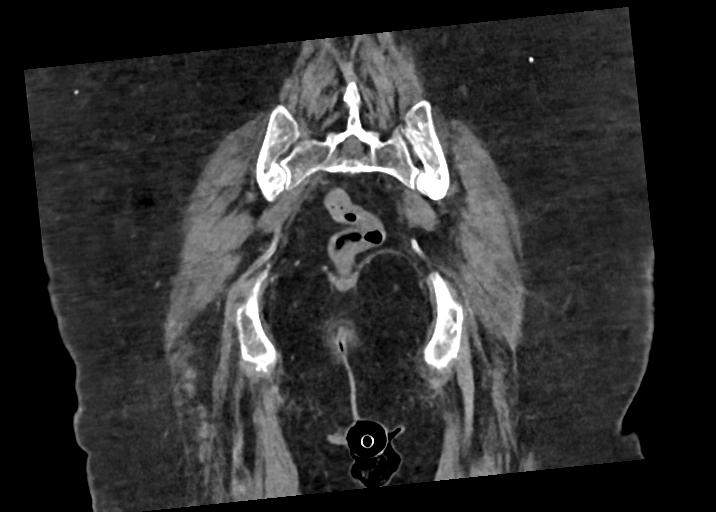

[16 of 46 positions shown; findings below may reference images not displayed]

FINDINGS: Urinary Tract: Native renal atrophy not included on today's images.
Left pelvic renal transplant without hydronephrosis. No hydroureter.
Moderately distended but otherwise unremarkable urinary bladder.

Bowel: No dilated large or small bowel. Some retained stool in the
colon. Partially visible left abdominal gastric band catheter.

Vascular/Lymphatic: Severe aortic and bilateral iliofemoral
calcified atherosclerosis. Vascular patency is not evaluated in the
absence of IV contrast. No lymphadenopathy identified.

Reproductive:  Diminutive or absent as before.

Other: No pelvic free fluid. Regressed but not completely resolved
right lower abdominal wall subcutaneous inflammation or hematoma
since last year, mild residual stranding on series 3, image 16.

Musculoskeletal: No convincing perineum or asymmetric buttock
inflammation. No soft tissue gas or fluid collection.

No acute osseous abnormality identified.
IMPRESSION: 1. No convincing buttock cellulitis or acute inflammatory process.
No soft tissue gas or fluid collection.

2. Severe Aortic Atherosclerosis ([id number].J), and iliofemoral
atherosclerosis. Cannot exclude iliac or femoral artery occlusion
due to plaque.

3. Left pelvic renal transplant.

## 2022-11-02 NOTE — Telephone Encounter (Signed)
Prescription Request  11/02/2022  LOV: 06/16/2022  What is the name of the medication or equipment? Oxycodone HCl 10 MG TABS   Have you contacted your pharmacy to request a refill? Yes   Which pharmacy would you like this sent to?  [id number] [address] [place] [address] [phone] [phone]    Patient notified that their request is being sent to the clinical staff for review and that they should receive a response within 2 business days.   Please advise at Mobile [phone] (mobile)   Patient scheduled for 11/24/22

## 2022-11-02 NOTE — Telephone Encounter (Signed)
Pt will need to be reschedule. She has not seen MD since 05/2022. Will need OV for HHN. ..Johny Chess

## 2022-11-02 NOTE — Telephone Encounter (Signed)
Patient cancelled appointment for today 11-23-2022. Her brother passed away. She would like to know if she can get a home health nurse since he was her caregiver. She would like a call back at [phone].

## 2022-11-04 ENCOUNTER — Telehealth: Payer: Self-pay | Admitting: Internal Medicine

## 2022-11-04 ENCOUNTER — Other Ambulatory Visit: Payer: Self-pay | Admitting: Internal Medicine

## 2022-11-04 DIAGNOSIS — M4726 Other spondylosis with radiculopathy, lumbar region: Secondary | ICD-10-CM

## 2022-11-04 DIAGNOSIS — M47816 Spondylosis without myelopathy or radiculopathy, lumbar region: Secondary | ICD-10-CM

## 2022-11-04 DIAGNOSIS — M1711 Unilateral primary osteoarthritis, right knee: Secondary | ICD-10-CM

## 2022-11-04 DIAGNOSIS — Z515 Encounter for palliative care: Secondary | ICD-10-CM

## 2022-11-04 MED ORDER — OXYCODONE HCL 10 MG PO TABS: 10.0000 mg | ORAL_TABLET | Freq: Three times a day (TID) | ORAL | 0 refills | Status: DC | PRN

## 2022-11-04 NOTE — Telephone Encounter (Signed)
Pt will need to see Dr. Ronnald Ramp first before any [facility] can be ordered.[name] KitchenJohny Chess

## 2022-11-04 NOTE — Telephone Encounter (Signed)
Patient called and said that she needs her medication - she is in a lot of pain and she said that her brother passed away.

## 2022-11-04 NOTE — Telephone Encounter (Signed)
Patient wants to know if she can have home health ordered since her brother passed away.  Please call patient:  [phone]

## 2022-11-05 NOTE — Telephone Encounter (Signed)
Patient said she cannot get to the office. She has no way of getting here. She also has not been able to get anything done and is barely eating. She said she is completely alone and needs help. She would like a call back at [phone].

## 2022-11-06 ENCOUNTER — Inpatient Hospital Stay (HOSPITAL_COMMUNITY)
Admission: EM | Admit: 2022-11-06 | Discharge: 2022-11-11 | DRG: 303 | Disposition: A | Discharge status: Home Health | Payer: Medicare Other | Attending: Student | Admitting: Student

## 2022-11-06 ENCOUNTER — Emergency Department (HOSPITAL_COMMUNITY): Payer: Medicare Other

## 2022-11-06 ENCOUNTER — Other Ambulatory Visit: Payer: Self-pay

## 2022-11-06 ENCOUNTER — Encounter (HOSPITAL_COMMUNITY): Payer: Self-pay

## 2022-11-06 DIAGNOSIS — I10 Essential (primary) hypertension: Secondary | ICD-10-CM | POA: Diagnosis present

## 2022-11-06 DIAGNOSIS — R5381 Other malaise: Secondary | ICD-10-CM | POA: Diagnosis present

## 2022-11-06 DIAGNOSIS — C4490 Unspecified malignant neoplasm of skin, unspecified: Secondary | ICD-10-CM | POA: Diagnosis not present

## 2022-11-06 DIAGNOSIS — I959 Hypotension, unspecified: Secondary | ICD-10-CM | POA: Diagnosis not present

## 2022-11-06 DIAGNOSIS — E1151 Type 2 diabetes mellitus with diabetic peripheral angiopathy without gangrene: Secondary | ICD-10-CM | POA: Diagnosis present

## 2022-11-06 DIAGNOSIS — I200 Unstable angina: Secondary | ICD-10-CM | POA: Diagnosis not present

## 2022-11-06 DIAGNOSIS — N182 Chronic kidney disease, stage 2 (mild): Secondary | ICD-10-CM

## 2022-11-06 DIAGNOSIS — I739 Peripheral vascular disease, unspecified: Secondary | ICD-10-CM

## 2022-11-06 DIAGNOSIS — Z9884 Bariatric surgery status: Secondary | ICD-10-CM

## 2022-11-06 DIAGNOSIS — Z7984 Long term (current) use of oral hypoglycemic drugs: Secondary | ICD-10-CM

## 2022-11-06 DIAGNOSIS — Z886 Allergy status to analgesic agent status: Secondary | ICD-10-CM

## 2022-11-06 DIAGNOSIS — M549 Dorsalgia, unspecified: Secondary | ICD-10-CM | POA: Diagnosis present

## 2022-11-06 DIAGNOSIS — I25118 Atherosclerotic heart disease of native coronary artery with other forms of angina pectoris: Secondary | ICD-10-CM

## 2022-11-06 DIAGNOSIS — E1122 Type 2 diabetes mellitus with diabetic chronic kidney disease: Secondary | ICD-10-CM | POA: Diagnosis present

## 2022-11-06 DIAGNOSIS — Z7951 Long term (current) use of inhaled steroids: Secondary | ICD-10-CM

## 2022-11-06 DIAGNOSIS — I214 Non-ST elevation (NSTEMI) myocardial infarction: Principal | ICD-10-CM

## 2022-11-06 DIAGNOSIS — I25700 Atherosclerosis of coronary artery bypass graft(s), unspecified, with unstable angina pectoris: Secondary | ICD-10-CM | POA: Diagnosis present

## 2022-11-06 DIAGNOSIS — Z882 Allergy status to sulfonamides status: Secondary | ICD-10-CM

## 2022-11-06 DIAGNOSIS — K219 Gastro-esophageal reflux disease without esophagitis: Secondary | ICD-10-CM | POA: Diagnosis present

## 2022-11-06 DIAGNOSIS — I25810 Atherosclerosis of coronary artery bypass graft(s) without angina pectoris: Secondary | ICD-10-CM | POA: Diagnosis present

## 2022-11-06 DIAGNOSIS — M797 Fibromyalgia: Secondary | ICD-10-CM | POA: Diagnosis present

## 2022-11-06 DIAGNOSIS — J449 Chronic obstructive pulmonary disease, unspecified: Secondary | ICD-10-CM | POA: Diagnosis present

## 2022-11-06 DIAGNOSIS — Z66 Do not resuscitate: Secondary | ICD-10-CM | POA: Diagnosis present

## 2022-11-06 DIAGNOSIS — Z8616 Personal history of COVID-19: Secondary | ICD-10-CM

## 2022-11-06 DIAGNOSIS — L89152 Pressure ulcer of sacral region, stage 2: Secondary | ICD-10-CM | POA: Diagnosis present

## 2022-11-06 DIAGNOSIS — Z91048 Other nonmedicinal substance allergy status: Secondary | ICD-10-CM

## 2022-11-06 DIAGNOSIS — G4733 Obstructive sleep apnea (adult) (pediatric): Secondary | ICD-10-CM | POA: Diagnosis present

## 2022-11-06 DIAGNOSIS — I480 Paroxysmal atrial fibrillation: Secondary | ICD-10-CM | POA: Diagnosis present

## 2022-11-06 DIAGNOSIS — Z881 Allergy status to other antibiotic agents status: Secondary | ICD-10-CM

## 2022-11-06 DIAGNOSIS — Z7902 Long term (current) use of antithrombotics/antiplatelets: Secondary | ICD-10-CM

## 2022-11-06 DIAGNOSIS — H5461 Unqualified visual loss, right eye, normal vision left eye: Secondary | ICD-10-CM | POA: Diagnosis present

## 2022-11-06 DIAGNOSIS — I252 Old myocardial infarction: Secondary | ICD-10-CM

## 2022-11-06 DIAGNOSIS — N1831 Chronic kidney disease, stage 3a: Secondary | ICD-10-CM | POA: Diagnosis present

## 2022-11-06 DIAGNOSIS — Z8614 Personal history of Methicillin resistant Staphylococcus aureus infection: Secondary | ICD-10-CM

## 2022-11-06 DIAGNOSIS — F419 Anxiety disorder, unspecified: Secondary | ICD-10-CM | POA: Diagnosis present

## 2022-11-06 DIAGNOSIS — Z794 Long term (current) use of insulin: Secondary | ICD-10-CM

## 2022-11-06 DIAGNOSIS — L988 Other specified disorders of the skin and subcutaneous tissue: Secondary | ICD-10-CM | POA: Diagnosis present

## 2022-11-06 DIAGNOSIS — Z8249 Family history of ischemic heart disease and other diseases of the circulatory system: Secondary | ICD-10-CM

## 2022-11-06 DIAGNOSIS — N390 Urinary tract infection, site not specified: Secondary | ICD-10-CM | POA: Diagnosis present

## 2022-11-06 DIAGNOSIS — I130 Hypertensive heart and chronic kidney disease with heart failure and stage 1 through stage 4 chronic kidney disease, or unspecified chronic kidney disease: Secondary | ICD-10-CM | POA: Diagnosis present

## 2022-11-06 DIAGNOSIS — Z7952 Long term (current) use of systemic steroids: Secondary | ICD-10-CM

## 2022-11-06 DIAGNOSIS — I5042 Chronic combined systolic (congestive) and diastolic (congestive) heart failure: Secondary | ICD-10-CM | POA: Diagnosis present

## 2022-11-06 DIAGNOSIS — Z885 Allergy status to narcotic agent status: Secondary | ICD-10-CM

## 2022-11-06 DIAGNOSIS — M069 Rheumatoid arthritis, unspecified: Secondary | ICD-10-CM | POA: Diagnosis present

## 2022-11-06 DIAGNOSIS — I69398 Other sequelae of cerebral infarction: Secondary | ICD-10-CM

## 2022-11-06 DIAGNOSIS — I152 Hypertension secondary to endocrine disorders: Secondary | ICD-10-CM | POA: Diagnosis present

## 2022-11-06 DIAGNOSIS — I7092 Chronic total occlusion of artery of the extremities: Secondary | ICD-10-CM | POA: Diagnosis present

## 2022-11-06 DIAGNOSIS — C44609 Unspecified malignant neoplasm of skin of left upper limb, including shoulder: Secondary | ICD-10-CM | POA: Diagnosis present

## 2022-11-06 DIAGNOSIS — D631 Anemia in chronic kidney disease: Secondary | ICD-10-CM | POA: Diagnosis present

## 2022-11-06 DIAGNOSIS — Z940 Kidney transplant status: Secondary | ICD-10-CM

## 2022-11-06 DIAGNOSIS — Z9981 Dependence on supplemental oxygen: Secondary | ICD-10-CM

## 2022-11-06 DIAGNOSIS — I509 Heart failure, unspecified: Secondary | ICD-10-CM

## 2022-11-06 DIAGNOSIS — T8610 Unspecified complication of kidney transplant: Secondary | ICD-10-CM | POA: Diagnosis present

## 2022-11-06 DIAGNOSIS — Z9049 Acquired absence of other specified parts of digestive tract: Secondary | ICD-10-CM

## 2022-11-06 DIAGNOSIS — L8990 Pressure ulcer of unspecified site, unspecified stage: Secondary | ICD-10-CM | POA: Insufficient documentation

## 2022-11-06 DIAGNOSIS — E785 Hyperlipidemia, unspecified: Secondary | ICD-10-CM | POA: Diagnosis present

## 2022-11-06 DIAGNOSIS — F1120 Opioid dependence, uncomplicated: Secondary | ICD-10-CM | POA: Diagnosis present

## 2022-11-06 DIAGNOSIS — Z6830 Body mass index (BMI) 30.0-30.9, adult: Secondary | ICD-10-CM

## 2022-11-06 DIAGNOSIS — I447 Left bundle-branch block, unspecified: Secondary | ICD-10-CM | POA: Diagnosis present

## 2022-11-06 DIAGNOSIS — Z87891 Personal history of nicotine dependence: Secondary | ICD-10-CM

## 2022-11-06 DIAGNOSIS — G8929 Other chronic pain: Secondary | ICD-10-CM | POA: Diagnosis present

## 2022-11-06 DIAGNOSIS — E119 Type 2 diabetes mellitus without complications: Secondary | ICD-10-CM

## 2022-11-06 DIAGNOSIS — F32A Depression, unspecified: Secondary | ICD-10-CM | POA: Diagnosis present

## 2022-11-06 DIAGNOSIS — J9611 Chronic respiratory failure with hypoxia: Secondary | ICD-10-CM | POA: Diagnosis present

## 2022-11-06 DIAGNOSIS — J3089 Other allergic rhinitis: Secondary | ICD-10-CM | POA: Diagnosis present

## 2022-11-06 DIAGNOSIS — Z79899 Other long term (current) drug therapy: Secondary | ICD-10-CM

## 2022-11-06 DIAGNOSIS — E873 Alkalosis: Secondary | ICD-10-CM | POA: Diagnosis present

## 2022-11-06 DIAGNOSIS — Z634 Disappearance and death of family member: Secondary | ICD-10-CM

## 2022-11-06 DIAGNOSIS — I25110 Atherosclerotic heart disease of native coronary artery with unstable angina pectoris: Secondary | ICD-10-CM | POA: Diagnosis not present

## 2022-11-06 DIAGNOSIS — I350 Nonrheumatic aortic (valve) stenosis: Secondary | ICD-10-CM | POA: Diagnosis present

## 2022-11-06 DIAGNOSIS — E876 Hypokalemia: Secondary | ICD-10-CM | POA: Diagnosis present

## 2022-11-06 DIAGNOSIS — R079 Chest pain, unspecified: Secondary | ICD-10-CM | POA: Diagnosis present

## 2022-11-06 DIAGNOSIS — Z888 Allergy status to other drugs, medicaments and biological substances status: Secondary | ICD-10-CM

## 2022-11-06 DIAGNOSIS — Z7901 Long term (current) use of anticoagulants: Secondary | ICD-10-CM

## 2022-11-06 DIAGNOSIS — E669 Obesity, unspecified: Secondary | ICD-10-CM | POA: Diagnosis present

## 2022-11-06 DIAGNOSIS — Z955 Presence of coronary angioplasty implant and graft: Secondary | ICD-10-CM

## 2022-11-06 DIAGNOSIS — E1169 Type 2 diabetes mellitus with other specified complication: Secondary | ICD-10-CM | POA: Diagnosis present

## 2022-11-06 LAB — CBC WITH DIFFERENTIAL/PLATELET
Abs Immature Granulocytes: 0.04 10*3/uL (ref 0.00–0.07)
Basophils Absolute: 0 10*3/uL (ref 0.0–0.1)
Basophils Relative: 0 %
Eosinophils Absolute: 0.1 10*3/uL (ref 0.0–0.5)
Eosinophils Relative: 2 %
HCT: 34.7 % — ABNORMAL LOW (ref 36.0–46.0)
Hemoglobin: 11.6 g/dL — ABNORMAL LOW (ref 12.0–15.0)
Immature Granulocytes: 1 %
Lymphocytes Relative: 20 %
Lymphs Abs: 1.1 10*3/uL (ref 0.7–4.0)
MCH: 31.8 pg (ref 26.0–34.0)
MCHC: 33.4 g/dL (ref 30.0–36.0)
MCV: 95.1 fL (ref 80.0–100.0)
Monocytes Absolute: 0.4 10*3/uL (ref 0.1–1.0)
Monocytes Relative: 7 %
Neutro Abs: 4 10*3/uL (ref 1.7–7.7)
Neutrophils Relative %: 70 %
Platelets: 262 10*3/uL (ref 150–400)
RBC: 3.65 MIL/uL — ABNORMAL LOW (ref 3.87–5.11)
RDW: 17.3 % — ABNORMAL HIGH (ref 11.5–15.5)
WBC: 5.7 10*3/uL (ref 4.0–10.5)
nRBC: 0.5 % — ABNORMAL HIGH (ref 0.0–0.2)

## 2022-11-06 LAB — POTASSIUM: Potassium: 2.9 mmol/L — ABNORMAL LOW (ref 3.5–5.1)

## 2022-11-06 LAB — I-STAT VENOUS BLOOD GAS, ED
Acid-Base Excess: 6 mmol/L — ABNORMAL HIGH (ref 0.0–2.0)
Bicarbonate: 29.5 mmol/L — ABNORMAL HIGH (ref 20.0–28.0)
Calcium, Ion: 0.95 mmol/L — ABNORMAL LOW (ref 1.15–1.40)
HCT: 32 % — ABNORMAL LOW (ref 36.0–46.0)
Hemoglobin: 10.9 g/dL — ABNORMAL LOW (ref 12.0–15.0)
O2 Saturation: 88 %
Potassium: 5.2 mmol/L — ABNORMAL HIGH (ref 3.5–5.1)
Sodium: 134 mmol/L — ABNORMAL LOW (ref 135–145)
TCO2: 31 mmol/L (ref 22–32)
pCO2, Ven: 35.7 mmHg — ABNORMAL LOW (ref 44–60)
pH, Ven: 7.525 — ABNORMAL HIGH (ref 7.25–7.43)
pO2, Ven: 49 mmHg — ABNORMAL HIGH (ref 32–45)

## 2022-11-06 LAB — COMPREHENSIVE METABOLIC PANEL
ALT: 7 U/L (ref 0–44)
AST: 17 U/L (ref 15–41)
Albumin: 2.7 g/dL — ABNORMAL LOW (ref 3.5–5.0)
Alkaline Phosphatase: 82 U/L (ref 38–126)
Anion gap: 11 (ref 5–15)
BUN: 12 mg/dL (ref 8–23)
CO2: 26 mmol/L (ref 22–32)
Calcium: 8.2 mg/dL — ABNORMAL LOW (ref 8.9–10.3)
Chloride: 97 mmol/L — ABNORMAL LOW (ref 98–111)
Creatinine, Ser: 1.1 mg/dL — ABNORMAL HIGH (ref 0.44–1.00)
GFR, Estimated: 56 mL/min — ABNORMAL LOW (ref >60)
Glucose, Bld: 174 mg/dL — ABNORMAL HIGH (ref 70–99)
Potassium: 3.3 mmol/L — ABNORMAL LOW (ref 3.5–5.1)
Sodium: 134 mmol/L — ABNORMAL LOW (ref 135–145)
Total Bilirubin: 0.7 mg/dL (ref 0.3–1.2)
Total Protein: 6.4 g/dL — ABNORMAL LOW (ref 6.5–8.1)

## 2022-11-06 LAB — APTT: aPTT: 32 seconds (ref 24–36)

## 2022-11-06 LAB — URINALYSIS, W/ REFLEX TO CULTURE (INFECTION SUSPECTED)
Bilirubin Urine: NEGATIVE
Glucose, UA: 50 mg/dL — AB
Hgb urine dipstick: NEGATIVE
Ketones, ur: NEGATIVE mg/dL
Leukocytes,Ua: NEGATIVE
Nitrite: NEGATIVE
Protein, ur: NEGATIVE mg/dL
Specific Gravity, Urine: 1.015 (ref 1.005–1.030)
pH: 5 (ref 5.0–8.0)

## 2022-11-06 LAB — PROTIME-INR
INR: 1.6 — ABNORMAL HIGH (ref 0.8–1.2)
Prothrombin Time: 19.2 seconds — ABNORMAL HIGH (ref 11.4–15.2)

## 2022-11-06 LAB — HEPARIN LEVEL (UNFRACTIONATED): Heparin Unfractionated: >1.1 IU/mL — ABNORMAL HIGH (ref 0.30–0.70)

## 2022-11-06 LAB — GLUCOSE, CAPILLARY
Glucose-Capillary: 154 mg/dL — ABNORMAL HIGH (ref 70–99)
Glucose-Capillary: 317 mg/dL — ABNORMAL HIGH (ref 70–99)

## 2022-11-06 LAB — TSH: TSH: 1.609 u[IU]/mL (ref 0.350–4.500)

## 2022-11-06 LAB — TROPONIN I (HIGH SENSITIVITY)
Troponin I (High Sensitivity): 27 ng/L — ABNORMAL HIGH (ref <18)
Troponin I (High Sensitivity): 257 ng/L (ref <18)
Troponin I (High Sensitivity): 428 ng/L (ref <18)

## 2022-11-06 LAB — BRAIN NATRIURETIC PEPTIDE: B Natriuretic Peptide: 262.3 pg/mL — ABNORMAL HIGH (ref 0.0–100.0)

## 2022-11-06 MED ORDER — ONDANSETRON HCL 4 MG/2ML IJ SOLN: 4.0000 mg | Freq: Four times a day (QID) | INTRAMUSCULAR | Status: DC | PRN

## 2022-11-06 MED ORDER — LAMOTRIGINE 25 MG PO TABS
25.0000 mg | ORAL_TABLET | Freq: Every day | ORAL | Status: DC
  Administered 2022-11-06 – 2022-11-10 (×5): 25 mg via ORAL
  Filled 2022-11-06 (×6): qty 1

## 2022-11-06 MED ORDER — CLOPIDOGREL BISULFATE 75 MG PO TABS
75.0000 mg | ORAL_TABLET | Freq: Every day | ORAL | Status: DC
  Administered 2022-11-06 – 2022-11-11 (×6): 75 mg via ORAL
  Filled 2022-11-06 (×6): qty 1

## 2022-11-06 MED ORDER — BUDESONIDE 0.5 MG/2ML IN SUSP
2.0000 mL | Freq: Two times a day (BID) | RESPIRATORY_TRACT | Status: DC
  Administered 2022-11-06 – 2022-11-11 (×9): 0.5 mg via RESPIRATORY_TRACT
  Filled 2022-11-06 (×9): qty 2

## 2022-11-06 MED ORDER — BISACODYL 5 MG PO TBEC: 5.0000 mg | DELAYED_RELEASE_TABLET | Freq: Every day | ORAL | Status: DC | PRN

## 2022-11-06 MED ORDER — MONTELUKAST SODIUM 10 MG PO TABS
10.0000 mg | ORAL_TABLET | Freq: Every day | ORAL | Status: DC
  Administered 2022-11-06 – 2022-11-10 (×5): 10 mg via ORAL
  Filled 2022-11-06 (×5): qty 1

## 2022-11-06 MED ORDER — ONDANSETRON HCL 4 MG/2ML IJ SOLN
4.0000 mg | Freq: Four times a day (QID) | INTRAMUSCULAR | Status: DC | PRN
  Administered 2022-11-10: 4 mg via INTRAVENOUS
  Filled 2022-11-06: qty 2

## 2022-11-06 MED ORDER — ACETAMINOPHEN 325 MG PO TABS
650.0000 mg | ORAL_TABLET | ORAL | Status: DC | PRN
  Administered 2022-11-06: 650 mg via ORAL
  Filled 2022-11-06: qty 2

## 2022-11-06 MED ORDER — PROMETHAZINE HCL 25 MG PO TABS: 25.0000 mg | ORAL_TABLET | Freq: Four times a day (QID) | ORAL | Status: DC | PRN

## 2022-11-06 MED ORDER — ACETAMINOPHEN 325 MG PO TABS: 650.0000 mg | ORAL_TABLET | ORAL | Status: DC | PRN

## 2022-11-06 MED ORDER — POTASSIUM CHLORIDE CRYS ER 10 MEQ PO TBCR
10.0000 meq | EXTENDED_RELEASE_TABLET | Freq: Two times a day (BID) | ORAL | Status: DC
  Administered 2022-11-06 – 2022-11-09 (×7): 10 meq via ORAL
  Filled 2022-11-06 (×8): qty 1

## 2022-11-06 MED ORDER — CYCLOBENZAPRINE HCL 10 MG PO TABS
10.0000 mg | ORAL_TABLET | Freq: Three times a day (TID) | ORAL | Status: DC | PRN
  Administered 2022-11-06 – 2022-11-11 (×5): 10 mg via ORAL
  Filled 2022-11-06 (×6): qty 1

## 2022-11-06 MED ORDER — LORAZEPAM 1 MG PO TABS
1.0000 mg | ORAL_TABLET | Freq: Three times a day (TID) | ORAL | Status: DC | PRN
  Administered 2022-11-06 – 2022-11-11 (×11): 1 mg via ORAL
  Filled 2022-11-06 (×11): qty 1

## 2022-11-06 MED ORDER — APIXABAN 5 MG PO TABS: 5.0000 mg | ORAL_TABLET | Freq: Two times a day (BID) | ORAL | Status: DC

## 2022-11-06 MED ORDER — ENSURE MAX PROTEIN PO LIQD
11.0000 [oz_av] | Freq: Two times a day (BID) | ORAL | Status: DC
  Administered 2022-11-06: 11 [oz_av] via ORAL
  Administered 2022-11-06: 237 mL via ORAL
  Administered 2022-11-07 – 2022-11-11 (×8): 11 [oz_av] via ORAL
  Filled 2022-11-06 (×11): qty 330

## 2022-11-06 MED ORDER — NITROGLYCERIN 0.4 MG SL SUBL
0.4000 mg | SUBLINGUAL_TABLET | SUBLINGUAL | Status: DC | PRN
  Administered 2022-11-06 – 2022-11-09 (×3): 0.4 mg via SUBLINGUAL
  Filled 2022-11-06 (×3): qty 1

## 2022-11-06 MED ORDER — PANTOPRAZOLE SODIUM 40 MG PO TBEC
40.0000 mg | DELAYED_RELEASE_TABLET | Freq: Every day | ORAL | Status: DC
  Administered 2022-11-06 – 2022-11-11 (×6): 40 mg via ORAL
  Filled 2022-11-06 (×6): qty 1

## 2022-11-06 MED ORDER — INSULIN ASPART 100 UNIT/ML IJ SOLN
0.0000 [IU] | Freq: Three times a day (TID) | INTRAMUSCULAR | Status: DC
  Administered 2022-11-06: 3 [IU] via SUBCUTANEOUS
  Administered 2022-11-07: 11 [IU] via SUBCUTANEOUS
  Administered 2022-11-07: 3 [IU] via SUBCUTANEOUS
  Administered 2022-11-08: 8 [IU] via SUBCUTANEOUS
  Administered 2022-11-08: 15 [IU] via SUBCUTANEOUS
  Administered 2022-11-09 (×2): 5 [IU] via SUBCUTANEOUS
  Administered 2022-11-09: 3 [IU] via SUBCUTANEOUS
  Administered 2022-11-10: 2 [IU] via SUBCUTANEOUS
  Administered 2022-11-10 (×2): 3 [IU] via SUBCUTANEOUS
  Administered 2022-11-11: 5 [IU] via SUBCUTANEOUS
  Administered 2022-11-11: 8 [IU] via SUBCUTANEOUS

## 2022-11-06 MED ORDER — ALBUTEROL SULFATE (2.5 MG/3ML) 0.083% IN NEBU: 3.0000 mL | INHALATION_SOLUTION | Freq: Four times a day (QID) | RESPIRATORY_TRACT | Status: DC | PRN

## 2022-11-06 MED ORDER — METOPROLOL TARTRATE 5 MG/5ML IV SOLN: 2.5000 mg | Freq: Four times a day (QID) | INTRAVENOUS | Status: DC | PRN

## 2022-11-06 MED ORDER — HEPARIN (PORCINE) 25000 UT/250ML-% IV SOLN
1050.0000 [IU]/h | INTRAVENOUS | Status: DC
  Administered 2022-11-06: 1000 [IU]/h via INTRAVENOUS
  Filled 2022-11-06: qty 250

## 2022-11-06 MED ORDER — ISOSORBIDE MONONITRATE ER 60 MG PO TB24
60.0000 mg | ORAL_TABLET | Freq: Every day | ORAL | Status: DC
  Administered 2022-11-07 – 2022-11-08 (×2): 60 mg via ORAL
  Filled 2022-11-06 (×3): qty 1

## 2022-11-06 MED ORDER — METOPROLOL TARTRATE 25 MG PO TABS
25.0000 mg | ORAL_TABLET | Freq: Three times a day (TID) | ORAL | Status: DC
  Administered 2022-11-06 – 2022-11-08 (×8): 25 mg via ORAL
  Filled 2022-11-06 (×9): qty 1

## 2022-11-06 MED ORDER — POTASSIUM CHLORIDE CRYS ER 20 MEQ PO TBCR
40.0000 meq | EXTENDED_RELEASE_TABLET | ORAL | Status: AC
  Administered 2022-11-06 (×2): 40 meq via ORAL
  Filled 2022-11-06 (×2): qty 2

## 2022-11-06 MED ORDER — CARIPRAZINE HCL 1.5 MG PO CAPS
1.5000 mg | ORAL_CAPSULE | Freq: Every day | ORAL | Status: DC
  Administered 2022-11-06 – 2022-11-11 (×6): 1.5 mg via ORAL
  Filled 2022-11-06 (×6): qty 1

## 2022-11-06 MED ORDER — ISOSORBIDE MONONITRATE ER 30 MG PO TB24: 60.0000 mg | ORAL_TABLET | Freq: Every day | ORAL | Status: DC

## 2022-11-06 MED ORDER — SODIUM CHLORIDE 0.9 % IV SOLN
2.0000 g | INTRAVENOUS | Status: DC
  Administered 2022-11-06: 2 g via INTRAVENOUS
  Filled 2022-11-06: qty 20

## 2022-11-06 MED ORDER — ARFORMOTEROL TARTRATE 15 MCG/2ML IN NEBU
15.0000 ug | INHALATION_SOLUTION | Freq: Two times a day (BID) | RESPIRATORY_TRACT | Status: DC
  Administered 2022-11-06 – 2022-11-11 (×9): 15 ug via RESPIRATORY_TRACT
  Filled 2022-11-06 (×10): qty 2

## 2022-11-06 MED ORDER — GABAPENTIN 100 MG PO CAPS
100.0000 mg | ORAL_CAPSULE | Freq: Every day | ORAL | Status: DC
  Administered 2022-11-06 – 2022-11-10 (×5): 100 mg via ORAL
  Filled 2022-11-06 (×5): qty 1

## 2022-11-06 MED ORDER — ALPRAZOLAM 0.25 MG PO TABS: 0.5000 mg | ORAL_TABLET | Freq: Three times a day (TID) | ORAL | Status: DC | PRN

## 2022-11-06 MED ORDER — OXYCODONE HCL 5 MG PO TABS
10.0000 mg | ORAL_TABLET | Freq: Three times a day (TID) | ORAL | Status: DC | PRN
  Administered 2022-11-06 – 2022-11-11 (×11): 10 mg via ORAL
  Filled 2022-11-06 (×11): qty 2

## 2022-11-06 MED ORDER — SERTRALINE HCL 100 MG PO TABS
200.0000 mg | ORAL_TABLET | Freq: Every day | ORAL | Status: DC
  Administered 2022-11-06 – 2022-11-11 (×6): 200 mg via ORAL
  Filled 2022-11-06 (×6): qty 2

## 2022-11-06 MED ORDER — LORAZEPAM 2 MG/ML IJ SOLN: 1.0000 mg | Freq: Once | INTRAMUSCULAR | Status: DC

## 2022-11-06 MED ORDER — ACETAMINOPHEN 500 MG PO TABS
1000.0000 mg | ORAL_TABLET | Freq: Once | ORAL | Status: AC
  Administered 2022-11-06: 1000 mg via ORAL
  Filled 2022-11-06: qty 2

## 2022-11-06 MED ORDER — ROSUVASTATIN CALCIUM 20 MG PO TABS
20.0000 mg | ORAL_TABLET | Freq: Every day | ORAL | Status: DC
  Administered 2022-11-06 – 2022-11-10 (×5): 20 mg via ORAL
  Filled 2022-11-06 (×5): qty 1

## 2022-11-06 MED ORDER — FLUTICASONE PROPIONATE 50 MCG/ACT NA SUSP
1.0000 | Freq: Every day | NASAL | Status: DC
  Administered 2022-11-07 – 2022-11-11 (×5): 1 via NASAL
  Filled 2022-11-06: qty 16

## 2022-11-06 MED ORDER — RANOLAZINE ER 500 MG PO TB12
1000.0000 mg | ORAL_TABLET | Freq: Two times a day (BID) | ORAL | Status: DC
  Administered 2022-11-06 – 2022-11-11 (×11): 1000 mg via ORAL
  Filled 2022-11-06 (×11): qty 2

## 2022-11-06 MED ORDER — PREDNISONE 5 MG PO TABS
5.0000 mg | ORAL_TABLET | Freq: Every day | ORAL | Status: DC
  Administered 2022-11-07 – 2022-11-11 (×5): 5 mg via ORAL
  Filled 2022-11-06 (×5): qty 1

## 2022-11-06 MED ORDER — AZATHIOPRINE 50 MG PO TABS
125.0000 mg | ORAL_TABLET | Freq: Every day | ORAL | Status: DC
  Administered 2022-11-06 – 2022-11-11 (×6): 125 mg via ORAL
  Filled 2022-11-06 (×10): qty 3

## 2022-11-06 MED ORDER — ALPRAZOLAM 0.25 MG PO TABS: 0.2500 mg | ORAL_TABLET | Freq: Two times a day (BID) | ORAL | Status: DC | PRN

## 2022-11-06 MED ORDER — INSULIN ASPART 100 UNIT/ML IJ SOLN
0.0000 [IU] | Freq: Every day | INTRAMUSCULAR | Status: DC
  Administered 2022-11-06 – 2022-11-07 (×2): 4 [IU] via SUBCUTANEOUS
  Administered 2022-11-10: 2 [IU] via SUBCUTANEOUS

## 2022-11-06 MED ORDER — FUROSEMIDE 40 MG PO TABS
40.0000 mg | ORAL_TABLET | Freq: Every day | ORAL | Status: DC
  Administered 2022-11-07 – 2022-11-10 (×4): 40 mg via ORAL
  Filled 2022-11-06 (×4): qty 1

## 2022-11-06 MED ORDER — INSULIN GLARGINE-YFGN 100 UNIT/ML ~~LOC~~ SOLN
30.0000 [IU] | Freq: Every day | SUBCUTANEOUS | Status: DC
  Administered 2022-11-07 – 2022-11-11 (×5): 30 [IU] via SUBCUTANEOUS
  Filled 2022-11-06 (×6): qty 0.3

## 2022-11-06 NOTE — Consult Note (Addendum)
Cardiology Consultation   Patient ID: Katherine [name] MRN: [id number]; DOB: 02-11-1956  Admit date: 11/06/2022 Date of Consult: 11/06/2022  PCP:  [name], MD   [place] [facility] Cardiologist:  [name], MD        Patient Profile:   Katherine [name] is a 67 y.o. female with a hx of CAD s/p CABG and multiple PCI's, oxygen dependent COPD, chronic diastolic heart failure, PAD, carotid artery disease, hypertension, hyperlipidemia, paroxysmal A-fib on Eliquis,type 2 DM, GERD, hx of ESRD s/p renal transplant 1991 on chronic immunosuppression meds, anxiety with depression, CVA, chronic anemia, OSA, torsades/QT prolongation (in setting of electrolyte disturbance, Tikosyn, Zoloft, phenergan), obesity s/p prior lap band who is being seen 11/06/2022 for the evaluation of chest pain at the request of Dr. Truett Mainland.  History of Present Illness:   [name] is a 67 year old female with above noted medical history who presented to the D via EMS this morning. She states that she woke up this morning with chest discomfort and found that her nasal cannula had fallen off her face. Discomfort initially rated 8/10. Patient says that the type of pain felt is hard to describe but says that it did feel the same as discomfort prior to prior MI. She put O2 back on and took a nitroglycerin with some relief of pain. When pain did not fully resolve, she called EMS who upon their arrival, recommended ED evaluation. Per patient, O2 saturation was found in the 70s by EMS. Patient says that she takes 3-4 nitroglycerin per week due to chronic chest discomfort, usually felt in the setting of exertion/increased HR. Other than this morning's episode of chest pain, she denies a significant change in frequency/severity of chronic chest pain. Reports compliance with all cardiac medications. In addition to chest pain, patient says that she has chronic nausea. She denies CHF symptoms including orthopnea,  weight gain/edema. She denies frequent palpitations as well and feels her paroxysmal afib is stable.  Patient is under emotional stress currently due to the death of her brother in the last week. Patient apparently found him/called 911 and understandably becomes quite emotional when discussing the last few days.   Past Medical History:  Diagnosis Date   Anemia    Anxiety    Bilateral carotid artery stenosis    Carotid duplex [id number]: [id number] LICA, [id number] RICA, [id number] RECA, f/u 1 yr suggested   CAD (coronary artery disease) of bypass graft 5/01; 3/'02, 8/'03, 10/'04; 1/15   PCI x 5 to SVG-D1    CAD in native artery 07/1993   3 Vessel Disease (LAD-D1 & RCA) -- CABG (Dx in setting of inferior STEMI-PTCA of RCA)   COPD mixed type ([place])    Followed by Dr. Lamonte Sakai "pulmonologist said no COPD"   Depression with anxiety    Diabetes mellitus type 2 in obese ([place])    Diarrhea    started after cholecystectomy and mass removed from intestine   Dyslipidemia, goal LDL below 70    08/2012: TC 137, TG 200, HDL 32!, LDL 45; on statin (followed by Dr.Deterding)   ESRD (end stage renal disease) ([place]) 1991   s/p Cadaveric Renal Transplant [facility] - Dr. 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  Had having having lots of nausea and vomiting with poor p.o. intake.  Thought to have QT prolongation with multiple medications involved and hypomagnesemia, hypokalemia.  Tikosyn discontinued along with Zoloft and Phenergan.   Hypertension associated with diabetes ([place])    Mild aortic stenosis by prior echocardiogram 07/2019    Echo:  Mild aortic stenosis (gradients: Mean 14.3 mmHg -peak 24.9 mmHg).   Morbid obesity ([facility])    MRSA (methicillin resistant staph aureus) culture positive    OSA (obstructive sleep apnea)    no longer on CPAP or home O2, states she doesn't need now after lap band   PAD (peripheral artery disease) ([place]) 08/2013   LEA Dopplers to be read by Dr. Fletcher Anon   PAF (paroxysmal atrial fibrillation) ([place]) 06/2014   Noted on CardioNet Monitor  - --> rhythm control with Tikosyn (Dr. Rayann Heman); converted from warfarin to apixaban for anticoagulation.   Pneumonia    Recurrent boils    Bilateral Groin   Rheumatoid arthritis ([place])    Per Patient Report; associated with OA   S/p cadaver renal transplant 1991   [facility]    Past Surgical History:  Procedure Laterality Date   ABDOMINAL AORTOGRAM N/A 04/21/2018   Procedure: ABDOMINAL AORTOGRAM;  Surgeon: [name], MD;  Location: [place] [facility];  Service: Cardiovascular;  Laterality: N/A;   CATHETER REMOVAL     CHOLECYSTECTOMY N/A 10/29/2014   Procedure: LAPAROSCOPIC CHOLECYSTECTOMY WITH INTRAOPERATIVE CHOLANGIOGRAM;  Surgeon: Excell Seltzer, MD;  Location: [facility];  Service: General;  Laterality: N/A;   CORONARY ANGIOPLASTY  1994   x5   CORONARY ARTERY BYPASS GRAFT  1995   LIMA-LAD, SVG-RPDA, SVG-D1   CORONARY STENT INTERVENTION N/A 02/20/2021   Procedure: PERCUTANEOUS CORONARY STENT INTERVENTION;  Surgeon: [name], MD;  Location: [place] CV [facility]; ostLCx 60% (Neg RFR 0.96);; SVG- D2 recurrent 90% ISR & 95% native D2 after graft-> DES PCI of 95% anastomotic D2 lesion (Onyx [place] 2.25 mm x 12 mm => 2.75 mm @ overlap, 2.5 distal.);PTCA of ISR in body of graft. ->  2.5 mm scoring balloon & post-dil w/ 2.75 mm [place] balloon   ESOPHAGOGASTRODUODENOSCOPY N/A 10/15/2016   Procedure: ESOPHAGOGASTRODUODENOSCOPY (EGD);  Surgeon: Wilford Corner, MD;  Location: [facility];  Service: Endoscopy;  Laterality: N/A;   I & D EXTREMITY Right  01/29/2018   Procedure: IRRIGATION AND DEBRIDEMENT THUMB;  Surgeon: Dayna Barker, MD;  Location: [place];  Service: Plastics;  Laterality: Right;   INCISE AND DRAIN ABCESS     INTRAVASCULAR PRESSURE WIRE/FFR STUDY N/A 02/20/2021   Procedure: INTRAVASCULAR PRESSURE WIRE/FFR STUDY;  Surgeon: [name], MD;  Location: [place] [facility];  Service: Cardiovascular;  Laterality: N/A;   KIDNEY TRANSPLANT  1991   KNEE ARTHROSCOPY WITH LATERAL MENISECTOMY Left 12/03/2017   Procedure: LEFT KNEE ARTHROSCOPY WITH LATERAL MENISECTOMY;  Surgeon: Earlie Server, MD;  Location: [place];  Service: Orthopedics;  Laterality: Left;   LAPAROSCOPIC GASTRIC BANDING  04/2004; 10/'09, 2/'10   Port Replacement x 2   LEFT HEART CATH AND CORONARY ANGIOGRAPHY N/A 02/20/2021   Procedure: LEFT HEART CATH AND CORONARY ANGIOGRAPHY;  Surgeon: [name], MD;  Location: [place] [facility];  Service: Cardiovascular;  Laterality: N/A;   LEFT HEART CATH AND CORS/GRAFTS ANGIOGRAPHY N/A  04/21/2018   Procedure: LEFT HEART CATH AND CORS/GRAFTS ANGIOGRAPHY;  Surgeon: [name], MD;  Location: [place] [facility];  Ost-Prox LAD 50% - proxLAD (pre & post D1) 100% CTO. Cx - patent, small OM1 (stable ~ ostial OM1 90%, too small for PCI) & 2 small LPL; Ost-distal RCA 100% CTO.  LIMA-LAD (not injected); SVG-dRCA patent, SVG-D1 - insertion stent ~20% ISR - Severe R CFA disease w/ focal Sub TO   LEFT HEART CATH AND CORS/GRAFTS ANGIOGRAPHY  5/'01, 3/'02, 8/'03, 10/'04; 1/'15   08/22/2013: LAD & RCA 100%; LIMA-LAD & SVG-rPDA patent; Cx-- OM1 60%, OM2 ostial ~50%; SVG-D1 - 80% mid, 50% distal ISR --PCI   LEFT HEART CATH AND CORS/GRAFTS ANGIOGRAPHY N/A 01/31/2021   Procedure: LEFT HEART CATH AND CORS/GRAFTS ANGIOGRAPHY;  Surgeon: Jolaine Artist, MD;  Location: [place] [facility] LAB;;   LEFT HEART CATH AND CORS/GRAFTS ANGIOGRAPHY N/A 09/05/2021   Procedure: LEFT HEART CATH AND CORS/GRAFTS ANGIOGRAPHY;  Surgeon: [name], MD;   Location: [place] [facility];  Service: Cardiovascular;  Laterality: N/A;   LEFT HEART CATH AND CORS/GRAFTS ANGIOGRAPHY N/A 08/21/2021   Procedure: LEFT HEART CATH AND CORS/GRAFTS ANGIOGRAPHY;  Surgeon: Belva Crome, MD;  Location: [place] [facility];  Service: Cardiovascular;  Laterality: N/A;   LEFT HEART CATHETERIZATION WITH CORONARY/GRAFT ANGIOGRAM N/A 08/23/2013   Procedure: LEFT HEART CATHETERIZATION WITH Beatrix Fetters;  Surgeon: Wellington Hampshire, MD;  Location: [place] CATH LAB;  Service: Cardiovascular;  Laterality: N/A;   Lower Extremity Arterial Dopplers  08/2013   ABI: R 0.96, L 1.04   MULTIPLE TOOTH EXTRACTIONS  age 54   NM MYOVIEW LTD  03/2016   EF 62%. LOW RISK. C/W prior MI - no Ischemia. Apical hypokinesis.   PERCUTANEOUS CORONARY STENT INTERVENTION (PCI-S)  5/'01, 3/'02, 8/'03, 10/'04;   '01 - S660 BMS 2.5 x 9 - dSVG-D1 into D1; '02- post-stent stenosis - 2.5 x 8 Pixel BMS; '8\03: ISR/Thrombosis into native D1 - AngioJet, 2.5 x 13 Pixel; '04 - ISR 95% - covered stented area with Taxus DES 2.5 mm x 20 (2.88)   PERCUTANEOUS CORONARY STENT INTERVENTION (PCI-S)  08/23/2013   Procedure: PERCUTANEOUS CORONARY STENT INTERVENTION (PCI-S);  Surgeon: Wellington Hampshire, MD;  Location: [facility] CATH LAB;  Service: Cardiovascular;;mid SVG-D1 80%; distal stent ~50% ISR; Promus [place] DES 2.75 mm xc 20 mm (2.8 mm)   PORT-A-CATH REMOVAL     kidney   TRANSTHORACIC ECHOCARDIOGRAM  07/2019   a) 07/2019: EF 55 to 60%.  No LVH.  Paradoxical septal WM-s/p CABG.  GRII DD.  Nl RV size and fxn.  Mild bilateral atrial dilation.  Mod MAC.  Trace MR.  Mild AS (gradients: Mean 14.3 mmHg -peak 24.9 mmHg).; B) 06/2020: EF 40 to 45%.  Moderate concentric LVH.  GRII DD.  Elevated LAP.  Mod HK mid Apical Ant-AntSept wall & mild Apical Dyskinesis.  Mod LA dilation.  Mild MR.  AoV sclerosis w/o AS.   TRANSTHORACIC ECHOCARDIOGRAM  01/30/2021   EF 55 to 60%.  Mild LVH.  GR 1 DD.  Elevated LAP.  Moderate LA dilation.   Mild MR with mild MS.  Mild aortic valve stenosis.:   TUBAL LIGATION     wrist fistula repair Left    dialysis for one year     Home Medications:  Prior to Admission medications   Medication Sig Start Date End Date Taking? Authorizing Provider  albuterol (VENTOLIN HFA) 108 (90 Base) MCG/ACT inhaler TAKE 2  PUFFS BY MOUTH EVERY 6 HOURS AS NEEDED FOR WHEEZE OR SHORTNESS OF BREATH 07/30/22   Collene Gobble, MD  amoxicillin-clavulanate (AUGMENTIN) 875-125 MG tablet Take 1 tablet by mouth 2 (two) times daily. 10/14/22   Biagio Borg, MD  arformoterol (BROVANA) 15 MCG/2ML NEBU Take 2 mLs (15 mcg total) by nebulization 2 (two) times daily. 08/30/19   Collene Gobble, MD  azaTHIOprine (IMURAN) 50 MG tablet Take 2 & 1/2 tablets (125 mg) by mouth daily at 3pm Patient taking differently: Take 125 mg by mouth daily. 10/21/21   Pokhrel, Corrie Mckusick, MD  bisacodyl (DULCOLAX) 5 MG EC tablet Take 1 tablet (5 mg total) by mouth daily as needed for moderate constipation. 10/05/21   Allie Bossier, MD  budesonide (PULMICORT) 0.5 MG/2ML nebulizer solution Take 2 mLs (0.5 mg total) by nebulization 2 (two) times daily. Patient taking differently: Take 2 mLs by nebulization 2 (two) times daily as needed (wheezing). 08/24/19   [name], MD  calcitRIOL (ROCALTROL) 0.25 MCG capsule Take 0.25 mcg by mouth See admin instructions. Takes every 3 days.    [provider]  cariprazine (VRAYLAR) 1.5 MG capsule Take 1 capsule (1.5 mg total) by mouth daily. 06/16/22   [name], MD  clopidogrel (PLAVIX) 75 MG tablet Take 75 mg by mouth daily.    [provider]  Continuous Blood Gluc Receiver (FREESTYLE LIBRE 2 READER) DEVI 1 Act by Does not apply route daily. 06/17/22   [name], MD  Continuous Blood Gluc Sensor (FREESTYLE LIBRE 2 SENSOR) MISC 1 Act by Does not apply route daily. 06/17/22   [name], MD  [employer] VITAMIN C 500 MG tablet TAKE 1 TABLET (500 MG TOTAL) BY MOUTH DAILY. 02/03/22   [name], MD  cyclobenzaprine (FLEXERIL) 10 MG tablet TAKE 1 TABLET BY MOUTH TWICE A DAY AS NEEDED FOR MUSCLE SPASMS 09/14/22   [name], MD  ELIQUIS 5 MG TABS tablet TAKE 1 TABLET BY MOUTH TWICE A DAY 10/22/22   [name], MD  Ensure Max Protein (ENSURE MAX PROTEIN) LIQD Take 330 mLs (11 oz total) by mouth 2 (two) times daily. 12/12/21   Mariel Aloe, MD  fluticasone (FLONASE) 50 MCG/ACT nasal spray PLACE 2 SPRAYS INTO BOTH NOSTRILS 2 TIMES DAILY. 08/04/22   [name], MD  furosemide (LASIX) 40 MG tablet TAKE 1 TABLET BY MOUTH EVERY DAY 08/10/22   [name], MD  gabapentin (NEURONTIN) 100 MG capsule Take 1 capsule (100 mg total) by mouth at bedtime. 09/11/21   Hosie Poisson, MD  glimepiride (AMARYL) 4 MG tablet Take 4 mg by mouth daily with breakfast.    [provider]  insulin aspart (NOVOLOG FLEXPEN) 100 UNIT/ML FlexPen Inject 4-12 Units into the skin 3 (three) times daily with meals. Sliding scale.    [provider]  Insulin Glargine (BASAGLAR KWIKPEN) 100 UNIT/ML Inject 30 Units into the skin daily. 06/17/22   [name], MD  Insulin Pen Needle (PEN NEEDLES) 32G X 4 MM MISC Use as needed to administer insulin. E11.9 02/01/22   [name], MD  isosorbide mononitrate (IMDUR) 60 MG 24 hr tablet TAKE 1 TABLET BY MOUTH EVERY DAY 07/13/22   [name], MD  KLOR-CON M10 10 MEQ tablet TAKE 1 TABLET BY MOUTH 2 TIMES DAILY. 09/19/22   [name], MD  lamoTRIgine (LAMICTAL) 25 MG tablet TAKE 1 TABLET BY MOUTH EVERYDAY AT BEDTIME 02/19/22  Meredith Staggers, MD  LORazepam (ATIVAN) 1 MG tablet Take 1 tablet (1 mg total) by mouth every 8 (eight) hours as needed for anxiety. 09/26/22   [name], MD  metFORMIN (GLUCOPHAGE) 500 MG tablet Take 500 mg by mouth 2 (two) times daily. 12/08/21   [provider]  metoprolol tartrate (LOPRESSOR) 25 MG tablet Take 1 tablet (25 mg total) by mouth 3 (three) times daily. 10/26/22   [name], MD   montelukast (SINGULAIR) 10 MG tablet TAKE 1 TABLET BY MOUTH EVERYDAY AT BEDTIME 05/04/22   Collene Gobble, MD  Multiple Vitamin (MULTIVITAMIN WITH MINERALS) TABS tablet Take 1 tablet by mouth daily. 12/13/21   Mariel Aloe, MD  nitroGLYCERIN (NITROSTAT) 0.4 MG SL tablet PLACE 1 TAB UNDER TONGUE EVERY 5MINS FOR CHEST PAIN 06/30/22   [name], MD  Oxycodone HCl 10 MG TABS Take 1 tablet (10 mg total) by mouth every 8 (eight) hours as needed. 11/04/22   [name], MD  OXYGEN Inhale 4 L into the lungs continuous.    [provider]  pantoprazole (PROTONIX) 40 MG tablet TAKE 1 TABLET BY MOUTH EVERY DAY 08/05/22   [name], MD  predniSONE (DELTASONE) 5 MG tablet Take 5 mg by mouth daily with breakfast.    [provider]  promethazine (PHENERGAN) 25 MG tablet Take 1 tablet (25 mg total) by mouth every 6 (six) hours as needed for nausea or vomiting. 05/20/22   Haydee Salter, MD  ranolazine (RANEXA) 1000 MG SR tablet TAKE 1 TABLET BY MOUTH TWICE A DAY 10/31/22   [name], MD  rosuvastatin (CRESTOR) 20 MG tablet TAKE 1 TABLET BY MOUTH EVERY DAY 07/29/22   [name], MD  sertraline (ZOLOFT) 100 MG tablet Take 2 tablets (200 mg total) by mouth daily. 06/16/22   [name], MD    Inpatient Medications: Scheduled Meds:  Continuous Infusions:  PRN Meds:   Allergies:    Allergies  Allergen Reactions   Tetracycline Hives    Patient tolerated Doxycycline Dec 2020   Niacin Other (See Comments)    Mouth blisters   Niaspan [Niacin Er] Other (See Comments)    Mouth blisters   Sulfa Antibiotics Nausea Only and Other (See Comments)    "Tears up stomach"   Sulfonamide Derivatives Other (See Comments)    Reaction: per patient "tears her stomach up"   Codeine Nausea And Vomiting   Erythromycin Nausea And Vomiting   Hydromorphone Hcl Nausea And Vomiting   Morphine And Related Nausea And Vomiting   Nalbuphine Nausea And Vomiting    Nubain    Sulfasalazine Nausea Only and Other (See Comments)    per patient "tears her stomach up", "Tears up stomach"   Tape Rash and Other (See Comments)    No "plastic" tape," please----cloth tape only    Social History:   Social History   Socioeconomic History   Marital status: Widowed    Spouse name: Not on file   Number of children: 0   Years of education: Not on file   Highest education level: High school graduate  Occupational History   Occupation: Disability    Comment: since 1989  Tobacco Use   Smoking status: Former    Packs/day: 1.00    Years: 30.00    Additional pack years: 0.00    Total pack years: 30.00    Types: Cigarettes    Quit date: 08/17/2002    Years  since quitting: 20.2   Smokeless tobacco: Never  Vaping Use   Vaping Use: Never used  Substance and Sexual Activity   Alcohol use: No   Drug use: No   Sexual activity: Not Currently  Other Topics Concern   Not on file  Social History Narrative   ** Merged History Encounter ** She is currently married, and the caregiver of her husband who is recovering from surgery for tongue cancer now diagnosed with lung cancer. Prior to his diagnosis of her husband, she actually had adopted a 65-year-old child who she knows caring for as well. With all the surgeries, they have been quite financially troubled. Thanks the help of her community and church, they have been able to stay "alfoat."     She is a former smoker who quit in 2004 after a 30-pack-year history.   She is active chasing a 58-year-old child, does not do routine exercise. She's been quite depressed with the condition of her husband, and admits to eating comfort herself.   She does not drink alcohol.      05/13/2021 Patient reports that her husband is deceased. She is grieving the loss of her husband and the decline in her physical independence.    Social Determinants of Health   Financial Resource Strain: Not on file  Food Insecurity: No Food Insecurity (07/20/2022)    Hunger Vital Sign    Worried About Running Out of Food in the Last Year: Never true    Ran Out of Food in the Last Year: Never true  Transportation Needs: No Transportation Needs (05/12/2022)   PRAPARE - Hydrologist (Medical): No    Lack of Transportation (Non-Medical): No  Physical Activity: Not on file  Stress: Not on file  Social Connections: Not on file  Intimate Partner Violence: Not on file    Family History:    Family History  Problem Relation Age of Onset   Cancer Mother        liver   Heart disease Father    Cancer Father        colon   Arrhythmia Brother        Atrial Fibrillation   Arrhythmia Paternal Aunt        Atrial Fibrillation     ROS:  Please see the history of present illness.   All other ROS reviewed and negative.     Physical Exam/Data:   Vitals:   11/06/22 0922 11/06/22 0930 11/06/22 1000 11/06/22 1030  BP:  118/78 109/77 101/65  Pulse: (!) 115 (!) 111 (!) 114 92  Resp: (!) 23 (!) 23    Temp:      TempSrc:      SpO2: 92% 93% 93% 97%   No intake or output data in the 24 hours ending 11/06/22 1046    06/16/2022    2:55 PM 02/02/2022    2:36 PM 12/12/2021    2:50 AM  Last 3 Weights  Weight (lbs) 190 lb 192 lb 196 lb 10.4 oz  Weight (kg) 86.183 kg 87.091 kg 89.2 kg     There is no height or weight on file to calculate BMI.  General:  Well nourished, well developed, in no acute distress HEENT: normal Neck: no JVD Vascular: No carotid bruits; Distal pulses 2+ bilaterally Cardiac:  normal S1, S2; tachycardic but regular rate; no murmur Lungs:  quiet end-inspiratory wheezing in bilateral upper lobes. No basilar crackles/rales Abd: soft, nontender, no hepatomegaly  Ext: no  edema. Left forearm with chronic appearing skin wound. Musculoskeletal:  No deformities, BUE and BLE strength normal and equal Skin: warm and dry  Neuro:  CNs 2-12 intact, no focal abnormalities noted Psych:  Normal affect   EKG:  The EKG  was personally reviewed and demonstrates:  sinus tachycardia, LBBB pattern. Appears consistent with prior tracings. Telemetry:  Telemetry was personally reviewed and demonstrates:  sinus tachycardia  Relevant CV Studies:  12/08/21 TTE  IMPRESSIONS     1. Left ventricular ejection fraction, by estimation, is 45 to 50%. The  left ventricle has mildly decreased function. The left ventricle  demonstrates regional wall motion abnormalities (see scoring  diagram/findings for description). There is mild  concentric left ventricular hypertrophy. Left ventricular diastolic  parameters are consistent with Grade III diastolic dysfunction  (restrictive). There is akinesis of the left ventricular, mid inferoseptal  wall. There is akinesis of the left ventricular,   apical septal wall.   2. Right ventricular systolic function is normal. The right ventricular  size is normal. There is normal pulmonary artery systolic pressure. The  estimated right ventricular systolic pressure is Q000111Q mmHg.   3. Left atrial size was mildly dilated.   4. The mitral valve is normal in structure. Trivial mitral valve  regurgitation. Mild to moderate mitral stenosis. Moderate mitral annular  calcification.   5. The aortic valve was not well visualized. There is moderate  calcification of the aortic valve. Aortic valve regurgitation is trivial.  Mild aortic valve stenosis. Aortic valve area, by VTI measures 1.34 cm.  Aortic valve mean gradient measures 10.0  mmHg. Aortic valve Vmax measures 2.20 m/s. LV Stroke Volume index 30, DVI  0.39.   6. The inferior vena cava is normal in size with greater than 50%  respiratory variability, suggesting right atrial pressure of 3 mmHg.   7. IT Issues from 12/08/21. Study resent 12/09/21 for read and review.   Comparison(s): Similar to prior.   FINDINGS   Left Ventricle: Left ventricular ejection fraction, by estimation, is 45  to 50%. The left ventricle has mildly decreased  function. The left  ventricle demonstrates regional wall motion abnormalities. Definity  contrast agent was given IV to delineate the  left ventricular endocardial borders. The left ventricular internal cavity  size was normal in size. There is mild concentric left ventricular  hypertrophy. Left ventricular diastolic function could not be evaluated  due to atrial fibrillation. Left  ventricular diastolic parameters are consistent with Grade III diastolic  dysfunction (restrictive).     LV Wall Scoring:  The apex is dyskinetic. The inferior septum is akinetic.   Right Ventricle: The right ventricular size is normal. No increase in  right ventricular wall thickness. Right ventricular systolic function is  normal. There is normal pulmonary artery systolic pressure. The tricuspid  regurgitant velocity is 2.75 m/s, and   with an assumed right atrial pressure of 3 mmHg, the estimated right  ventricular systolic pressure is Q000111Q mmHg.   Left Atrium: Left atrial size was mildly dilated.   Right Atrium: Right atrial size was normal in size.   Pericardium: There is no evidence of pericardial effusion.   Mitral Valve: The mitral valve is normal in structure. Moderate mitral  annular calcification. Trivial mitral valve regurgitation. Mild to  moderate mitral valve stenosis.   Tricuspid Valve: The tricuspid valve is normal in structure. Tricuspid  valve regurgitation is mild . No evidence of tricuspid stenosis.   Aortic Valve: The aortic valve was not well  visualized. There is moderate  calcification of the aortic valve. Aortic valve regurgitation is trivial.  Mild aortic stenosis is present. Aortic valve mean gradient measures 10.0  mmHg. Aortic valve peak gradient   measures 19.4 mmHg. Aortic valve area, by VTI measures 1.34 cm.   Pulmonic Valve: The pulmonic valve was normal in structure. Pulmonic valve  regurgitation is mild. No evidence of pulmonic stenosis.   Aorta: The aortic  root is normal in size and structure.   Venous: The inferior vena cava is normal in size with greater than 50%  respiratory variability, suggesting right atrial pressure of 3 mmHg.   IAS/Shunts: No atrial level shunt detected by color flow Doppler.   Laboratory Data:  High Sensitivity Troponin:  No results for input(s): "TROPONINIHS" in the last 720 hours.   Chemistry Recent Labs  Lab 11/06/22 0822  NA 134*  K 5.2*    No results for input(s): "PROT", "ALBUMIN", "AST", "ALT", "ALKPHOS", "BILITOT" in the last 168 hours. Lipids No results for input(s): "CHOL", "TRIG", "HDL", "LABVLDL", "LDLCALC", "CHOLHDL" in the last 168 hours.  Hematology Recent Labs  Lab 11/06/22 0821 11/06/22 0822  WBC 5.7  --   RBC 3.65*  --   HGB 11.6* 10.9*  HCT 34.7* 32.0*  MCV 95.1  --   MCH 31.8  --   MCHC 33.4  --   RDW 17.3*  --   PLT 262  --    Thyroid No results for input(s): "TSH", "FREET4" in the last 168 hours.  BNP Recent Labs  Lab 11/06/22 0821  BNP 262.3*    DDimer No results for input(s): "DDIMER" in the last 168 hours.   Radiology/Studies:  DG Chest Port 1 View  Result Date: 11/06/2022 CLINICAL DATA:  Chest pain and dyspnea EXAM: PORTABLE CHEST 1 VIEW COMPARISON:  02/02/2022 chest radiograph. FINDINGS: Stable mid to lower sternotomy wire discontinuities. CABG clips overlie the mediastinum. Stable cardiomediastinal silhouette with top-normal heart size. No pneumothorax. No pleural effusion. No acute consolidative airspace disease. No overt pulmonary edema. Chronic coarsened interstitial and patchy reticular opacities throughout both lungs. IMPRESSION: No acute cardiopulmonary disease. Chronic coarsened interstitial and patchy reticular opacities throughout both lungs, favor nonspecific scarring versus chronic interstitial lung disease. Electronically Signed   By: Ilona Sorrel M.D.   On: 11/06/2022 08:49     Assessment and Plan:   Katherine [name] is a 67 y.o. female with a hx of  CAD s/p CABG and multiple PCI's, oxygen dependent COPD, chronic systolic/diastolic heart failure, PAD, carotid artery disease, hypertension, hyperlipidemia, paroxysmal A-fib on Eliquis,type 2 DM, GERD, hx of ESRD s/p renal transplant 1991 on chronic immunosuppression meds, anxiety with depression, CVA, chronic anemia, OSA, torsades/QT prolongation (in setting of electrolyte disturbance, Tikosyn, Zoloft, phenergan), obesity s/p prior lap band who is being seen 11/06/2022 for the evaluation of chest pain at the request of Dr. Truett Mainland.  Chest pain CAD s/p CABG and multiple PCI's Hyperlipidemia  Patient with complex medical history presented to the ED with chest pain this morning. ECG with stable appearing LBBB. Troponin pending.  With cardiac workup still ongoing, suspect that symptoms more likely driven by hypoxia/demand with concurrent emotional distress. Patient is unfortunately a very poor heart catheterization candidate due to notable peripheral vascular disease/historical obstruction of left femoral artery and total occlusion of right femoral artery. Fortunately ECG appears consistent with prior tracing.  Trend troponin. If elevated, would prefer medical management due to vascular access challenges. Repeat echocardiogram to assess LV function/wall motion Continue  plavix, metoprolol, imdur, Ranexa, Crestor Heparin ordered with paroxysmal afib.  Hypertension  Patient with normal BP at present time. Will give Metoprolol but hold Imdur until tomorrow given use of nitroglycerin earlier this morning. If BP increases, could consider given today.   Chronic combined systolic/diastolic CHF  Patient with LVEF 45-50% on April 2023 TTE. Endorses GDMT compliance and has no obvious signs of volume overload on physical exam or CXR. BNP 262.3.  Would continue GDMT including Lasix 40mg , Metoprolol Tartrate 25mg  TID.   Paroxysmal atrial fibrillation  Patient in NSR with LBBB morphology today. Continue  Metoprolol Tartrate 25mg  TID. Will give heparin in place of eliquis until need for potential cardiac intervention is ruled out.  COPD  CXR with no acute pulmonary abnormalities. Continue home regimen.  Risk Assessment/Risk Scores:     TIMI Risk Score for Unstable Angina or Non-ST Elevation MI:    Troponin pending. Unable to calculate.  New York Heart Association (NYHA) Functional Class NYHA Class II  CHA2DS2-VASc Score = 6   This indicates a 9.7% annual risk of stroke. The patient's score is based upon: CHF History: 1 HTN History: 1 Diabetes History: 1 Stroke History: 0 Vascular Disease History: 1 Age Score: 1 Gender Score: 1         For questions or updates, please contact [place] Please consult [email or web address] for contact info under    Signed, Lily Kocher, PA-C  11/06/2022 10:46 AM  I have examined the patient and reviewed assessment and plan and discussed with patient.  Agree with above as stated.   Patient with episode of hypoxia and subsequent chest pain.  She is also under a lot of stress due to the recent death of her brother.  His funeral is today.  She requested something to help with her nerves.  Cardiology as needed orders written for which include alprazolam 0.25 mg p.o. twice daily for anxiety.  Continue heparin for 48 hours.  No plans for invasive testing.  Her pain is gradually decreasing and is now down to a 3.  Of note, she asked me to look at her left forearm.  It appears that she has a skin cancer which she has under her bandage.  Will defer to admitting hospitalist team for further management.  She said she was sent to his surgeon by Dr. Ronnald Ramp but it does not appear that she saw the surgeon.  Larae Grooms

## 2022-11-06 NOTE — ED Notes (Signed)
Stemi Canceled.

## 2022-11-06 NOTE — Progress Notes (Signed)
UA negative, ceftriaxone discontinued.

## 2022-11-06 NOTE — ED Notes (Signed)
Pt placed on Zole upon arrival to ED.

## 2022-11-06 NOTE — ED Provider Notes (Signed)
[facility] Provider Note  CSN: [id number] Arrival date & time: 11/06/22 [id number]  Chief Complaint(s) Code STEMI  HPI Katherine [name] is a 67 y.o. female with history of coronary artery disease, COPD on chronic oxygen, hyperlipidemia, renal transplant, department diabetes presenting with chest pain.  Patient reports that she woke up this morning with chest pain around 4 AM.  She reports that she has been under a lot of stress due to her brother's funeral which is today.  She reports that she also had some shortness of breath.  The chest pain went to her left shoulder.  Denies any radiation to the back.  No nausea or vomiting.  No fevers or chills.  No new cough, reports chronic cough.  No runny nose or sore throat.  No leg swelling.  She also reports recently having intermittent dysuria and foul-smelling urine.  No abdominal pain or flank pain.  EMS activated STEMI prehospital, gave her aspirin and 2 nitro.  EMS EKG shows left bundle branch block.  STEMI canceled on arrival by cardiology.  Past Medical History Past Medical History:  Diagnosis Date   Anemia    Anxiety    Bilateral carotid artery stenosis    Carotid duplex [id number]: [id number] LICA, [id number] RICA, [id number] RECA, f/u 1 yr suggested   CAD (coronary artery disease) of bypass graft 5/01; 3/'02, 8/'03, 10/'04; 1/15   PCI x 5 to SVG-D1    CAD in native artery 07/1993   3 Vessel Disease (LAD-D1 & RCA) -- CABG (Dx in setting of inferior STEMI-PTCA of RCA)   COPD mixed type ([place])    Followed by Dr. Lamonte Sakai "pulmonologist said no COPD"   Depression with anxiety    Diabetes mellitus type 2 in obese ([place])    Diarrhea    started after cholecystectomy and mass removed from intestine   Dyslipidemia, goal LDL below 70    08/2012: TC 137, TG 200, HDL 32!, LDL 45; on statin (followed by Dr.Deterding)   ESRD (end stage renal disease) ([place]) 1991   s/p Cadaveric Renal Transplant [facility] - Dr. 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  Had having having lots of nausea and vomiting with poor p.o. intake.  Thought to have QT prolongation with multiple medications involved and hypomagnesemia, hypokalemia.  Tikosyn discontinued along with Zoloft and Phenergan.   Hypertension associated with diabetes ([place])    Mild aortic stenosis by prior echocardiogram 07/2019   Echo:  Mild aortic stenosis (gradients: Mean 14.3 mmHg -peak 24.9 mmHg).   Morbid obesity (HCC)    MRSA (methicillin resistant staph aureus) culture positive    OSA (obstructive sleep apnea)    no longer on CPAP or home O2, states she doesn't need now after lap band   PAD (peripheral artery disease) ([place]) 08/2013   LEA Dopplers to be read by Dr. Fletcher Anon   PAF (paroxysmal atrial fibrillation) ([place]) 06/2014   Noted on CardioNet Monitor  - --> rhythm control with Tikosyn (Dr. Rayann Heman); converted from warfarin to apixaban  for anticoagulation.   Pneumonia    Recurrent boils    Bilateral Groin   Rheumatoid arthritis ([place])    Per Patient Report; associated with OA   S/p cadaver renal transplant [place]   Patient Active Problem List   Diagnosis Date Noted   Chest pain 11/06/2022   Acute upper respiratory infection 10/14/2022   Acute cystitis without hematuria 06/30/2022   Diuretic-induced hypokalemia 06/17/2022   Insulin-requiring or dependent type II diabetes mellitus ([place]) 06/17/2022   Mass of skin of left forearm 06/16/2022   Spondylosis of lumbar region without myelopathy or radiculopathy 12/19/2021   Encounter for palliative care involving  management of pain 12/19/2021   COPD with acute exacerbation ([place]) 12/07/2021   CHF exacerbation ([place]) 10/18/2021   Goals of care, counseling/discussion 10/03/2021   Class 2 obesity 09/25/2021   Rest pain of lower extremity due to atherosclerosis ([place]) 09/02/2021   Longstanding persistent atrial fibrillation (HCC)    Chronic diastolic CHF (congestive heart failure) ([place]) 08/06/2021   Obesity (BMI 30-39.9) 08/05/2021   Polymorphic ventricular tachycardia    Bradycardia    COPD (chronic obstructive pulmonary disease) ([place])    History of renal transplant    Coronary stent restenosis due to scar tissue    Chronic respiratory failure with hypoxia ([place]) 01/01/2021   Candidal skin infection 08/13/2020   Chronic bronchitis, mucopurulent ([place]) 08/24/2019   Current chronic use of systemic steroids 07/23/2019   OSA (obstructive sleep apnea)    Severe episode of recurrent major depressive disorder, with psychotic features ([place]) 02/16/2019   Obstructive chronic bronchitis without exacerbation 11/03/2018   Long term (current) use of antithrombotics/antiplatelets 04/28/2018   LBBB (left bundle branch block) 10/07/2017   Primary osteoarthritis of right knee 08/18/2017   Ovarian cyst 05/18/2017   Mild aortic stenosis by prior echocardiogram 01/28/2017   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   Normocytic anemia 10/13/2016   Fibromyalgia 03/30/2016   Other spondylosis with radiculopathy, lumbar region 03/30/2016   Type 2 diabetes mellitus with hyperlipidemia ([place]) 03/30/2016   Paroxysmal atrial fibrillation ([place]); CHA2DS2VASc score F, HTN, CAD, CVA = 5 06/28/2014   Debility 06/28/2014   CAD S/P percutaneous coronary angioplasty - PCI x 5 to SVG-D1 09/11/2013    Class: Diagnosis of   Essential hypertension    Unstable angina; recurrent 08/21/2013   PAD (peripheral artery disease) ([place]) 08/17/2013   CAD (coronary artery disease) of bypass graft 10/08/2012   Stenosis of right carotid artery without infarction  10/08/2012   Dyslipidemia, goal LDL below 70 10/08/2012    Class: Diagnosis of   Mitral annular calcification 10/08/2012   Renal transplant disorder 06/12/2012   Chronic allergic rhinitis 04/29/2011   Extrinsic asthma 09/09/2007   GERD 09/09/2007   Morbid obesity - s/p Lap Band 9/'05 05/07/2004    Class: Diagnosis of   ESRD (end stage renal disease) ([place]) 1991   Home Medication(s) Prior to Admission medications   Medication Sig Start Date End Date Taking? Authorizing Provider  albuterol (VENTOLIN HFA) 108 (90 Base) MCG/ACT inhaler TAKE 2 PUFFS BY MOUTH EVERY 6 HOURS AS NEEDED FOR WHEEZE OR SHORTNESS OF BREATH 07/30/22   Collene Gobble, MD  amoxicillin-clavulanate (AUGMENTIN) 875-125 MG tablet Take 1 tablet by mouth 2 (two) times daily. 10/14/22   Biagio Borg, MD  arformoterol (BROVANA) 15 MCG/2ML NEBU Take 2 mLs (15 mcg total) by nebulization 2 (two) times daily. 08/30/19   Collene Gobble, MD  azaTHIOprine (IMURAN) 50 MG tablet Take 2 &  1/2 tablets (125 mg) by mouth daily at 3pm Patient taking differently: Take 125 mg by mouth daily. 10/21/21   Pokhrel, Corrie Mckusick, MD  bisacodyl (DULCOLAX) 5 MG EC tablet Take 1 tablet (5 mg total) by mouth daily as needed for moderate constipation. 10/05/21   Allie Bossier, MD  budesonide (PULMICORT) 0.5 MG/2ML nebulizer solution Take 2 mLs (0.5 mg total) by nebulization 2 (two) times daily. Patient taking differently: Take 2 mLs by nebulization 2 (two) times daily as needed (wheezing). 08/24/19   [name], MD  calcitRIOL (ROCALTROL) 0.25 MCG capsule Take 0.25 mcg by mouth See admin instructions. Takes every 3 days.    [provider]  cariprazine (VRAYLAR) 1.5 MG capsule Take 1 capsule (1.5 mg total) by mouth daily. 06/16/22   [name], MD  clopidogrel (PLAVIX) 75 MG tablet Take 75 mg by mouth daily.    [provider]  Continuous Blood Gluc Receiver (FREESTYLE LIBRE 2 READER) DEVI 1 Act by Does not apply route daily. 06/17/22    [name], MD  Continuous Blood Gluc Sensor (FREESTYLE LIBRE 2 SENSOR) MISC 1 Act by Does not apply route daily. 06/17/22   [name], MD  [employer] VITAMIN C 500 MG tablet TAKE 1 TABLET (500 MG TOTAL) BY MOUTH DAILY. 02/03/22   [name], MD  cyclobenzaprine (FLEXERIL) 10 MG tablet TAKE 1 TABLET BY MOUTH TWICE A DAY AS NEEDED FOR MUSCLE SPASMS 09/14/22   [name], MD  ELIQUIS 5 MG TABS tablet TAKE 1 TABLET BY MOUTH TWICE A DAY 10/22/22   [name], MD  Ensure Max Protein (ENSURE MAX PROTEIN) LIQD Take 330 mLs (11 oz total) by mouth 2 (two) times daily. 12/12/21   Mariel Aloe, MD  fluticasone (FLONASE) 50 MCG/ACT nasal spray PLACE 2 SPRAYS INTO BOTH NOSTRILS 2 TIMES DAILY. 08/04/22   [name], MD  furosemide (LASIX) 40 MG tablet TAKE 1 TABLET BY MOUTH EVERY DAY 08/10/22   [name], MD  gabapentin (NEURONTIN) 100 MG capsule Take 1 capsule (100 mg total) by mouth at bedtime. 09/11/21   Hosie Poisson, MD  glimepiride (AMARYL) 4 MG tablet Take 4 mg by mouth daily with breakfast.    [provider]  insulin aspart (NOVOLOG FLEXPEN) 100 UNIT/ML FlexPen Inject 4-12 Units into the skin 3 (three) times daily with meals. Sliding scale.    [provider]  Insulin Glargine (BASAGLAR KWIKPEN) 100 UNIT/ML Inject 30 Units into the skin daily. 06/17/22   [name], MD  Insulin Pen Needle (PEN NEEDLES) 32G X 4 MM MISC Use as needed to administer insulin. E11.9 02/01/22   [name], MD  isosorbide mononitrate (IMDUR) 60 MG 24 hr tablet TAKE 1 TABLET BY MOUTH EVERY DAY 07/13/22   [name], MD  KLOR-CON M10 10 MEQ tablet TAKE 1 TABLET BY MOUTH 2 TIMES DAILY. 09/19/22   [name], MD  lamoTRIgine (LAMICTAL) 25 MG tablet TAKE 1 TABLET BY MOUTH EVERYDAY AT BEDTIME 02/19/22   Meredith Staggers, MD  LORazepam (ATIVAN) 1 MG tablet Take 1 tablet (1 mg total) by mouth every 8 (eight) hours as needed for anxiety. 09/26/22   [name], MD  metFORMIN  (GLUCOPHAGE) 500 MG tablet Take 500 mg by mouth 2 (two) times daily. 12/08/21   [provider]  metoprolol tartrate (LOPRESSOR) 25 MG tablet Take 1 tablet (25 mg total) by mouth 3 (three) times daily. 10/26/22  [name], MD  montelukast (SINGULAIR) 10 MG tablet TAKE 1 TABLET BY MOUTH EVERYDAY AT BEDTIME 05/04/22   Collene Gobble, MD  Multiple Vitamin (MULTIVITAMIN WITH MINERALS) TABS tablet Take 1 tablet by mouth daily. 12/13/21   Mariel Aloe, MD  nitroGLYCERIN (NITROSTAT) 0.4 MG SL tablet PLACE 1 TAB UNDER TONGUE EVERY 5MINS FOR CHEST PAIN 06/30/22   [name], MD  Oxycodone HCl 10 MG TABS Take 1 tablet (10 mg total) by mouth every 8 (eight) hours as needed. 11/04/22   [name], MD  OXYGEN Inhale 4 L into the lungs continuous.    [provider]  pantoprazole (PROTONIX) 40 MG tablet TAKE 1 TABLET BY MOUTH EVERY DAY 08/05/22   [name], MD  predniSONE (DELTASONE) 5 MG tablet Take 5 mg by mouth daily with breakfast.    [provider]  promethazine (PHENERGAN) 25 MG tablet Take 1 tablet (25 mg total) by mouth every 6 (six) hours as needed for nausea or vomiting. 05/20/22   Haydee Salter, MD  ranolazine (RANEXA) 1000 MG SR tablet TAKE 1 TABLET BY MOUTH TWICE A DAY 10/31/22   [name], MD  rosuvastatin (CRESTOR) 20 MG tablet TAKE 1 TABLET BY MOUTH EVERY DAY 07/29/22   [name], MD  sertraline (ZOLOFT) 100 MG tablet Take 2 tablets (200 mg total) by mouth daily. 06/16/22   [name], MD                                                                                                                                    Past Surgical History Past Surgical History:  Procedure Laterality Date   ABDOMINAL AORTOGRAM N/A 04/21/2018   Procedure: ABDOMINAL AORTOGRAM;  Surgeon: [name], MD;  Location: [facility];  Service: Cardiovascular;  Laterality: N/A;   CATHETER REMOVAL     CHOLECYSTECTOMY N/A 10/29/2014    Procedure: LAPAROSCOPIC CHOLECYSTECTOMY WITH INTRAOPERATIVE CHOLANGIOGRAM;  Surgeon: Excell Seltzer, MD;  Location: [facility];  Service: General;  Laterality: N/A;   CORONARY ANGIOPLASTY  1994   x5   CORONARY ARTERY BYPASS GRAFT  1995   LIMA-LAD, SVG-RPDA, SVG-D1   CORONARY STENT INTERVENTION N/A 02/20/2021   Procedure: PERCUTANEOUS CORONARY STENT INTERVENTION;  Surgeon: [name], MD;  Location: [facility]; ostLCx 60% (Neg RFR 0.96);; SVG- D2 recurrent 90% ISR & 95% native D2 after graft-> DES PCI of 95% anastomotic D2 lesion (Onyx Frontier 2.25 mm x 12 mm => 2.75 mm @ overlap, 2.5 distal.);PTCA of ISR in body of graft. ->  2.5 mm scoring balloon & post-dil w/ 2.75 mm [place] balloon   ESOPHAGOGASTRODUODENOSCOPY N/A 10/15/2016   Procedure: ESOPHAGOGASTRODUODENOSCOPY (EGD);  Surgeon: Wilford Corner, MD;  Location: [facility];  Service: Endoscopy;  Laterality: N/A;   I & D EXTREMITY Right 01/29/2018   Procedure: IRRIGATION AND DEBRIDEMENT THUMB;  Surgeon: Dayna Barker,  MD;  Location: [facility];  Service: Plastics;  Laterality: Right;   INCISE AND DRAIN ABCESS     INTRAVASCULAR PRESSURE WIRE/FFR STUDY N/A 02/20/2021   Procedure: INTRAVASCULAR PRESSURE WIRE/FFR STUDY;  Surgeon: [name], MD;  Location: [facility];  Service: Cardiovascular;  Laterality: N/A;   KIDNEY TRANSPLANT  1991   KNEE ARTHROSCOPY WITH LATERAL MENISECTOMY Left 12/03/2017   Procedure: LEFT KNEE ARTHROSCOPY WITH LATERAL MENISECTOMY;  Surgeon: Earlie Server, MD;  Location: [facility];  Service: Orthopedics;  Laterality: Left;   LAPAROSCOPIC GASTRIC BANDING  04/2004; 10/'09, 2/'10   Port Replacement x 2   LEFT HEART CATH AND CORONARY ANGIOGRAPHY N/A 02/20/2021   Procedure: LEFT HEART CATH AND CORONARY ANGIOGRAPHY;  Surgeon: [name], MD;  Location: [facility];  Service: Cardiovascular;  Laterality: N/A;   LEFT HEART CATH AND CORS/GRAFTS ANGIOGRAPHY N/A 04/21/2018   Procedure: LEFT HEART CATH  AND CORS/GRAFTS ANGIOGRAPHY;  Surgeon: [name], MD;  Location: [facility];  Ost-Prox LAD 50% - proxLAD (pre & post D1) 100% CTO. Cx - patent, small OM1 (stable ~ ostial OM1 90%, too small for PCI) & 2 small LPL; Ost-distal RCA 100% CTO.  LIMA-LAD (not injected); SVG-dRCA patent, SVG-D1 - insertion stent ~20% ISR - Severe R CFA disease w/ focal Sub TO   LEFT HEART CATH AND CORS/GRAFTS ANGIOGRAPHY  5/'01, 3/'02, 8/'03, 10/'04; 1/'15   08/22/2013: LAD & RCA 100%; LIMA-LAD & SVG-rPDA patent; Cx-- OM1 60%, OM2 ostial ~50%; SVG-D1 - 80% mid, 50% distal ISR --PCI   LEFT HEART CATH AND CORS/GRAFTS ANGIOGRAPHY N/A 01/31/2021   Procedure: LEFT HEART CATH AND CORS/GRAFTS ANGIOGRAPHY;  Surgeon: Jolaine Artist, MD;  Location: [place] [facility];;   LEFT HEART CATH AND CORS/GRAFTS ANGIOGRAPHY N/A 09/05/2021   Procedure: LEFT HEART CATH AND CORS/GRAFTS ANGIOGRAPHY;  Surgeon: [name], MD;  Location: [place] [facility];  Service: Cardiovascular;  Laterality: N/A;   LEFT HEART CATH AND CORS/GRAFTS ANGIOGRAPHY N/A 08/21/2021   Procedure: LEFT HEART CATH AND CORS/GRAFTS ANGIOGRAPHY;  Surgeon: Belva Crome, MD;  Location: [facility];  Service: Cardiovascular;  Laterality: N/A;   LEFT HEART CATHETERIZATION WITH CORONARY/GRAFT ANGIOGRAM N/A 08/23/2013   Procedure: LEFT HEART CATHETERIZATION WITH Beatrix Fetters;  Surgeon: Wellington Hampshire, MD;  Location: [facility];  Service: Cardiovascular;  Laterality: N/A;   Lower Extremity Arterial Dopplers  08/2013   ABI: R 0.96, L 1.04   MULTIPLE TOOTH EXTRACTIONS  age 73   NM MYOVIEW LTD  03/2016   EF 62%. LOW RISK. C/W prior MI - no Ischemia. Apical hypokinesis.   PERCUTANEOUS CORONARY STENT INTERVENTION (PCI-S)  5/'01, 3/'02, 8/'03, 10/'04;   '01 - S660 BMS 2.5 x 9 - dSVG-D1 into D1; '02- post-stent stenosis - 2.5 x 8 Pixel BMS; '8\03: ISR/Thrombosis into native D1 - AngioJet, 2.5 x 13 Pixel; '04 - ISR 95% - covered stented area with  Taxus DES 2.5 mm x 20 (2.88)   PERCUTANEOUS CORONARY STENT INTERVENTION (PCI-S)  08/23/2013   Procedure: PERCUTANEOUS CORONARY STENT INTERVENTION (PCI-S);  Surgeon: Wellington Hampshire, MD;  Location: [facility] C[facility];  Service: Cardiovascular;;mid SVG-D1 80%; distal stent ~50% ISR; Promus Prermier DES 2.75 mm xc 20 mm (2.8 mm)   PORT-A-CATH REMOVAL     kidney   TRANSTHORACIC ECHOCARDIOGRAM  07/2019   a) 07/2019: EF 55 to 60%.  No LVH.  Paradoxical septal WM-s/p CABG.  GRII DD.  Nl RV  size and fxn.  Mild bilateral atrial dilation.  Mod MAC.  Trace MR.  Mild AS (gradients: Mean 14.3 mmHg -peak 24.9 mmHg).; B) 06/2020: EF 40 to 45%.  Moderate concentric LVH.  GRII DD.  Elevated LAP.  Mod HK mid Apical Ant-AntSept wall & mild Apical Dyskinesis.  Mod LA dilation.  Mild MR.  AoV sclerosis w/o AS.   TRANSTHORACIC ECHOCARDIOGRAM  01/30/2021   EF 55 to 60%.  Mild LVH.  GR 1 DD.  Elevated LAP.  Moderate LA dilation.  Mild MR with mild MS.  Mild aortic valve stenosis.:   TUBAL LIGATION     wrist fistula repair Left    dialysis for one year   Family History Family History  Problem Relation Age of Onset   Cancer Mother        liver   Heart disease Father    Cancer Father        colon   Arrhythmia Brother        Atrial Fibrillation   Arrhythmia Paternal Aunt        Atrial Fibrillation    Social History Social History   Tobacco Use   Smoking status: Former    Packs/day: 1.00    Years: 30.00    Additional pack years: 0.00    Total pack years: 30.00    Types: Cigarettes    Quit date: 08/17/2002    Years since quitting: 20.2   Smokeless tobacco: Never  Vaping Use   Vaping Use: Never used  Substance Use Topics   Alcohol use: No   Drug use: No   Allergies Tetracycline, Niacin, Niaspan [niacin er], Sulfa antibiotics, Sulfonamide derivatives, Codeine, Erythromycin, Hydromorphone hcl, Morphine and related, Nalbuphine, Sulfasalazine, and Tape  Review of Systems Review of Systems  All other systems  reviewed and are negative.   Physical Exam Vital Signs  I have reviewed the triage vital signs BP 116/71   Pulse 97   Temp (!) 96 F (35.6 C) (Temporal)   Resp (!) 21   SpO2 100%  Physical Exam Vitals and nursing note reviewed.  Constitutional:      General: She is not in acute distress.    Appearance: She is well-developed. She is obese.  HENT:     Head: Normocephalic and atraumatic.     Mouth/Throat:     Mouth: Mucous membranes are moist.  Eyes:     Pupils: Pupils are equal, round, and reactive to light.  Cardiovascular:     Rate and Rhythm: Normal rate and regular rhythm.     Heart sounds: No murmur heard. Pulmonary:     Effort: Pulmonary effort is normal. No respiratory distress.     Breath sounds: Normal breath sounds.  Abdominal:     General: Abdomen is flat.     Palpations: Abdomen is soft.     Tenderness: There is no abdominal tenderness.  Musculoskeletal:        General: No tenderness.     Right lower leg: No edema.     Left lower leg: No edema.  Skin:    General: Skin is warm and dry.  Neurological:     General: No focal deficit present.     Mental Status: She is alert. Mental status is at baseline.  Psychiatric:        Mood and Affect: Mood normal.        Behavior: Behavior normal.     ED Results and Treatments Labs (all labs ordered are listed, but  only abnormal results are displayed) Labs Reviewed  CBC WITH DIFFERENTIAL/PLATELET - Abnormal; Notable for the following components:      Result Value   RBC 3.65 (*)    Hemoglobin 11.6 (*)    HCT 34.7 (*)    RDW 17.3 (*)    nRBC 0.5 (*)    All other components within normal limits  BRAIN NATRIURETIC PEPTIDE - Abnormal; Notable for the following components:   B Natriuretic Peptide 262.3 (*)    All other components within normal limits  PROTIME-INR - Abnormal; Notable for the following components:   Prothrombin Time 19.2 (*)    INR 1.6 (*)    All other components within normal limits   COMPREHENSIVE METABOLIC PANEL - Abnormal; Notable for the following components:   Sodium 134 (*)    Potassium 3.3 (*)    Chloride 97 (*)    Glucose, Bld 174 (*)    Creatinine, Ser 1.10 (*)    Calcium 8.2 (*)    Total Protein 6.4 (*)    Albumin 2.7 (*)    GFR, Estimated 56 (*)    All other components within normal limits  I-STAT VENOUS BLOOD GAS, ED - Abnormal; Notable for the following components:   pH, Ven 7.525 (*)    pCO2, Ven 35.7 (*)    pO2, Ven 49 (*)    Bicarbonate 29.5 (*)    Acid-Base Excess 6.0 (*)    Sodium 134 (*)    Potassium 5.2 (*)    Calcium, Ion 0.95 (*)    HCT 32.0 (*)    Hemoglobin 10.9 (*)    All other components within normal limits  TROPONIN I (HIGH SENSITIVITY) - Abnormal; Notable for the following components:   Troponin I (High Sensitivity) 27 (*)    All other components within normal limits  URINALYSIS, W/ REFLEX TO CULTURE (INFECTION SUSPECTED)  HEPARIN LEVEL (UNFRACTIONATED)  APTT  TROPONIN I (HIGH SENSITIVITY)                                                                                                                          Radiology DG Chest Port 1 View  Result Date: 11/06/2022 CLINICAL DATA:  Chest pain and dyspnea EXAM: PORTABLE CHEST 1 VIEW COMPARISON:  02/02/2022 chest radiograph. FINDINGS: Stable mid to lower sternotomy wire discontinuities. CABG clips overlie the mediastinum. Stable cardiomediastinal silhouette with top-normal heart size. No pneumothorax. No pleural effusion. No acute consolidative airspace disease. No overt pulmonary edema. Chronic coarsened interstitial and patchy reticular opacities throughout both lungs. IMPRESSION: No acute cardiopulmonary disease. Chronic coarsened interstitial and patchy reticular opacities throughout both lungs, favor nonspecific scarring versus chronic interstitial lung disease. Electronically Signed   By: Ilona Sorrel M.D.   On: 11/06/2022 08:49    Pertinent labs & imaging results that were  available during my care of the patient were reviewed by me and considered in my medical decision making (see MDM for details).  Medications Ordered in ED Medications  clopidogrel (PLAVIX) tablet 75 mg (75 mg Oral Given 11/06/22 1136)  furosemide (LASIX) tablet 40 mg (has no administration in time range)  metoprolol tartrate (LOPRESSOR) tablet 25 mg (25 mg Oral Not Given 11/06/22 1045)  ranolazine (RANEXA) 12 hr tablet 1,000 mg (1,000 mg Oral Given 11/06/22 1139)  rosuvastatin (CRESTOR) tablet 20 mg (has no administration in time range)  isosorbide mononitrate (IMDUR) 24 hr tablet 60 mg (has no administration in time range)  heparin ADULT infusion 100 units/mL (25000 units/224mL) (1,000 Units/hr Intravenous New Bag/Given 11/06/22 1147)  acetaminophen (TYLENOL) tablet 650 mg (has no administration in time range)  ondansetron (ZOFRAN) injection 4 mg (has no administration in time range)  ALPRAZolam (XANAX) tablet 0.25 mg (has no administration in time range)  Oxycodone HCl TABS 10 mg (has no administration in time range)  cariprazine (VRAYLAR) capsule 1.5 mg (has no administration in time range)  LORazepam (ATIVAN) tablet 1 mg (has no administration in time range)  sertraline (ZOLOFT) tablet 200 mg (has no administration in time range)  Basaglar KwikPen KwikPen 30 Units (has no administration in time range)  predniSONE (DELTASONE) tablet 5 mg (has no administration in time range)  bisacodyl (DULCOLAX) EC tablet 5 mg (has no administration in time range)  pantoprazole (PROTONIX) EC tablet 40 mg (has no administration in time range)  azaTHIOprine (IMURAN) tablet 125 mg (has no administration in time range)  cyclobenzaprine (FLEXERIL) tablet 10 mg (has no administration in time range)  gabapentin (NEURONTIN) capsule 100 mg (has no administration in time range)  lamoTRIgine (LAMICTAL) tablet 25 mg (has no administration in time range)  protein supplement (ENSURE MAX) liquid (has no administration in  time range)  potassium chloride (KLOR-CON M) CR tablet 10 mEq (has no administration in time range)  albuterol (VENTOLIN HFA) 108 (90 Base) MCG/ACT inhaler 1 puff (has no administration in time range)  arformoterol (BROVANA) nebulizer solution 15 mcg (has no administration in time range)  budesonide (PULMICORT) nebulizer solution 0.5 mg (has no administration in time range)  fluticasone (FLONASE) 50 MCG/ACT nasal spray 1 spray (has no administration in time range)  montelukast (SINGULAIR) tablet 10 mg (has no administration in time range)  promethazine (PHENERGAN) tablet 25 mg (has no administration in time range)  acetaminophen (TYLENOL) tablet 650 mg (has no administration in time range)  ondansetron (ZOFRAN) injection 4 mg (has no administration in time range)  acetaminophen (TYLENOL) tablet 1,000 mg (1,000 mg Oral Given 11/06/22 0904)                                                                                                                                     Procedures .Critical Care  Performed by: Cristie Hem, MD Authorized by: Cristie Hem, MD   Critical care provider statement:    Critical care time (minutes):  30   Critical care was necessary to treat or prevent imminent or life-threatening deterioration of the  following conditions:  Cardiac failure   Critical care was time spent personally by me on the following activities:  Development of treatment plan with patient or surrogate, discussions with consultants, evaluation of patient's response to treatment, examination of patient, ordering and review of laboratory studies, ordering and review of radiographic studies, ordering and performing treatments and interventions, pulse oximetry, re-evaluation of patient's condition and review of old charts   Care discussed with: admitting provider     (including critical care time)  Medical Decision Making / ED Course   MDM:  67 year old female presenting to the  emergency department with chest pain.  Patient well-appearing, vitals with mild tachycardia.  EKG demonstrates left bundle branch block.  Negative for S[place]ossa criteria.   Differential includes ACS, musculoskeletal chest pain, anxiety, less likely dissection without radiation to the back will check chest x-ray, 2+ radial pulses.  Less likely PE given patient is on chronic Eliquis.  Less likely pneumothorax or pneumonia but will check chest x-ray.  Will check troponin and other laboratory testing.  Patient currently appears euvolemic.  Given risk factors, patient may end up needing admission to the hospital for ACS rule out.  Will check urinalysis as well given patient reported history of dysuria.   Clinical Course as of 11/06/22 1241  Fri Nov 06, 2022  1239 Labs notable for findings of chronic compensatory alkalosis from COPD, mild elevated BNP, elevated troponin.  Cardiology would like patient to be started on heparin for 48 hours.  Discussed with the hospitalist to admit the patient.  They think that this may be stress-induced from recent emotional burden of brother dying.  Discussed with hospitalist Dr. Roosevelt Locks who will admit the patient. [WS]    Clinical Course User Index [WS] Cristie Hem, MD     Additional history obtained: -Additional history obtained from ems -External records from outside source obtained and reviewed including: Chart review including previous notes, labs, imaging, consultation notes including PMD note 10/14/22   Lab Tests: -I ordered, reviewed, and interpreted labs.   The pertinent results include:   Labs Reviewed  CBC WITH DIFFERENTIAL/PLATELET - Abnormal; Notable for the following components:      Result Value   RBC 3.65 (*)    Hemoglobin 11.6 (*)    HCT 34.7 (*)    RDW 17.3 (*)    nRBC 0.5 (*)    All other components within normal limits  BRAIN NATRIURETIC PEPTIDE - Abnormal; Notable for the following components:   B Natriuretic Peptide 262.3 (*)     All other components within normal limits  PROTIME-INR - Abnormal; Notable for the following components:   Prothrombin Time 19.2 (*)    INR 1.6 (*)    All other components within normal limits  COMPREHENSIVE METABOLIC PANEL - Abnormal; Notable for the following components:   Sodium 134 (*)    Potassium 3.3 (*)    Chloride 97 (*)    Glucose, Bld 174 (*)    Creatinine, Ser 1.10 (*)    Calcium 8.2 (*)    Total Protein 6.4 (*)    Albumin 2.7 (*)    GFR, Estimated 56 (*)    All other components within normal limits  I-STAT VENOUS BLOOD GAS, ED - Abnormal; Notable for the following components:   pH, Ven 7.525 (*)    pCO2, Ven 35.7 (*)    pO2, Ven 49 (*)    Bicarbonate 29.5 (*)    Acid-Base Excess 6.0 (*)    Sodium 134 (*)  Potassium 5.2 (*)    Calcium, Ion 0.95 (*)    HCT 32.0 (*)    Hemoglobin 10.9 (*)    All other components within normal limits  TROPONIN I (HIGH SENSITIVITY) - Abnormal; Notable for the following components:   Troponin I (High Sensitivity) 27 (*)    All other components within normal limits  URINALYSIS, W/ REFLEX TO CULTURE (INFECTION SUSPECTED)  HEPARIN LEVEL (UNFRACTIONATED)  APTT  TROPONIN I (HIGH SENSITIVITY)    Notable for elevated troponin  EKG   EKG Interpretation  Date/Time:  Friday November 06 2022 08:09:11 EDT Ventricular Rate:  114 PR Interval:  105 QRS Duration: 164 QT Interval:  387 QTC Calculation: 533 R Axis:   -44 Text Interpretation: Sinus tachycardia Left bundle branch block When compared to ECG 12/12/21 No significant change since last tracing Confirmed by Garnette Gunner [phone]) on 11/06/2022 8:15:53 AM         Imaging Studies ordered: I ordered imaging studies including CXR On my interpretation imaging demonstrates chronic emphysema  I independently visualized and interpreted imaging. I agree with the radiologist interpretation   Medicines ordered and prescription drug management: Meds ordered this encounter   Medications   acetaminophen (TYLENOL) tablet 1,000 mg   clopidogrel (PLAVIX) tablet 75 mg   furosemide (LASIX) tablet 40 mg   DISCONTD: isosorbide mononitrate (IMDUR) 24 hr tablet 60 mg   metoprolol tartrate (LOPRESSOR) tablet 25 mg   ranolazine (RANEXA) 12 hr tablet 1,000 mg   rosuvastatin (CRESTOR) tablet 20 mg   isosorbide mononitrate (IMDUR) 24 hr tablet 60 mg   heparin ADULT infusion 100 units/mL (25000 units/249mL)   acetaminophen (TYLENOL) tablet 650 mg   ondansetron (ZOFRAN) injection 4 mg   ALPRAZolam (XANAX) tablet 0.25 mg   Oxycodone HCl TABS 10 mg   cariprazine (VRAYLAR) capsule 1.5 mg   LORazepam (ATIVAN) tablet 1 mg   sertraline (ZOLOFT) tablet 200 mg   Basaglar KwikPen KwikPen 30 Units   predniSONE (DELTASONE) tablet 5 mg   bisacodyl (DULCOLAX) EC tablet 5 mg   pantoprazole (PROTONIX) EC tablet 40 mg   azaTHIOprine (IMURAN) tablet 125 mg   DISCONTD: apixaban (ELIQUIS) tablet 5 mg   cyclobenzaprine (FLEXERIL) tablet 10 mg   gabapentin (NEURONTIN) capsule 100 mg   lamoTRIgine (LAMICTAL) tablet 25 mg    TAKE 1 TABLET BY MOUTH EVERYDAY AT BEDTIME     protein supplement (ENSURE MAX) liquid   potassium chloride (KLOR-CON M) CR tablet 10 mEq   albuterol (VENTOLIN HFA) 108 (90 Base) MCG/ACT inhaler 1 puff    TAKE 2 PUFFS BY MOUTH EVERY 6 HOURS AS NEEDED FOR WHEEZE OR SHORTNESS OF BREATH     arformoterol (BROVANA) nebulizer solution 15 mcg   budesonide (PULMICORT) nebulizer solution 0.5 mg   fluticasone (FLONASE) 50 MCG/ACT nasal spray 1 spray    PLACE 2 SPRAYS INTO BOTH NOSTRILS 2 TIMES DAILY     montelukast (SINGULAIR) tablet 10 mg   promethazine (PHENERGAN) tablet 25 mg   acetaminophen (TYLENOL) tablet 650 mg   ondansetron (ZOFRAN) injection 4 mg    -I have reviewed the patients home medicines and have made adjustments as needed   Consultations Obtained: I requested consultation with the cardiologist,  and discussed lab and imaging findings as well as pertinent  plan - they recommend: admission for ACS rule out, heparin   Cardiac Monitoring: The patient was maintained on a cardiac monitor.  I personally viewed and interpreted the cardiac monitored which showed an underlying rhythm of:  NSR  Social Determinants of Health:  Diagnosis or treatment significantly limited by social determinants of health: former smoker and obesity   Reevaluation: After the interventions noted above, I reevaluated the patient and found that their symptoms have improved  Co morbidities that complicate the patient evaluation  Past Medical History:  Diagnosis Date   Anemia    Anxiety    Bilateral carotid artery stenosis    Carotid duplex [id number]: [id number] LICA, [id number] RICA, [id number] RECA, f/u 1 yr suggested   CAD (coronary artery disease) of bypass graft 5/01; 3/'02, 8/'03, 10/'04; 1/15   PCI x 5 to SVG-D1    CAD in native artery 07/1993   3 Vessel Disease (LAD-D1 & RCA) -- CABG (Dx in setting of inferior STEMI-PTCA of RCA)   COPD mixed type ([place])    Followed by Dr. Lamonte Sakai "pulmonologist said no COPD"   Depression with anxiety    Diabetes mellitus type 2 in obese ([place])    Diarrhea    started after cholecystectomy and mass removed from intestine   Dyslipidemia, goal LDL below 70    08/2012: TC 137, TG 200, HDL 32!, LDL 45; on statin (followed by Dr.Deterding)   ESRD (end stage renal disease) ([place]) 1991   s/p [facility]adaveric Renal Transplant [facility] - Dr. 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  Had having having lots of nausea and vomiting with poor p.o. intake.  Thought to have QT  prolongation with multiple medications involved and hypomagnesemia, hypokalemia.  Tikosyn discontinued along with Zoloft and Phenergan.   Hypertension associated with diabetes ([place])    Mild aortic stenosis by prior echocardiogram 07/2019   Echo:  Mild aortic stenosis (gradients: Mean 14.3 mmHg -peak 24.9 mmHg).   Morbid obesity ([facility])    MRSA (methicillin resistant staph aureus) culture positive    OSA (obstructive sleep apnea)    no longer on CPAP or home O2, states she doesn't need now after lap band   PAD (peripheral artery disease) ([place]) 08/2013   LEA Dopplers to be read by Dr. Fletcher Anon   PAF (paroxysmal atrial fibrillation) ([place]) 06/2014   Noted on CardioNet Monitor  - --> rhythm control with Tikosyn (Dr. Rayann Heman); converted from warfarin to apixaban for anticoagulation.   Pneumonia    Recurrent boils    Bilateral Groin   Rheumatoid arthritis ([place])    Per Patient Report; associated with OA   S/p cadaver renal transplant 1991   [facility]      Dispostion: Disposition decision including need for hospitalization was considered, and patient admitted to the hospital.    Final Clinical Impression(s) / ED Diagnoses Final diagnoses:  NSTEMI (non-ST elevated myocardial infarction) [facility])     This chart was dictated using voice recognition software.  Despite best efforts to proofread,  errors can occur which can change the documentation meaning.    Cristie Hem, MD 11/06/22 [phone]

## 2022-11-06 NOTE — ED Notes (Signed)
ED TO INPATIENT HANDOFF REPORT  ED Nurse Name and Phone #: Massie Maroon RN [phone]  S Name/Age/Gender Katherine [name] 67 y.o. female Room/Bed: TRAAC/TRAAC  Code Status   Code Status: DNR  Home/SNF/Other Home Patient oriented to: self, place, time, and situation Is this baseline? Yes   Triage Complete: Triage complete  Chief Complaint Chest pain [R07.9]  Triage Note Pt BIB [facility] from home d/t generalized CP that started at 0600,pt denies radiation but EMS reports she takes a Nitro "almost daily" & it alleviates her back/shoulder/CP. Pt reports she is under a lot of stress d/t loss of a family member, does have significant cardiac Hx, A/Ox4. Denies n/v, endorses having a recent respiratory infection lately & taking ABT, wears 7L O2 at baseline & she has foul smelling dark urine. Upon arrival to ED pt reports jaw pain. Was given 324 ASA, 20g Lt AC, 117/81.   Allergies Allergies  Allergen Reactions   Tetracycline Hives    Patient tolerated Doxycycline Dec 2020   Niacin Other (See Comments)    Mouth blisters   Niaspan [Niacin Er] Other (See Comments)    Mouth blisters   Sulfa Antibiotics Nausea Only and Other (See Comments)    "Tears up stomach"   Sulfonamide Derivatives Other (See Comments)    Reaction: per patient "tears her stomach up"   Codeine Nausea And Vomiting   Erythromycin Nausea And Vomiting   Hydromorphone Hcl Nausea And Vomiting   Morphine And Related Nausea And Vomiting   Nalbuphine Nausea And Vomiting    Nubain   Sulfasalazine Nausea Only and Other (See Comments)    per patient "tears her stomach up", "Tears up stomach"   Tape Rash and Other (See Comments)    No "plastic" tape," please----cloth tape only    Level of Care/Admitting Diagnosis ED Disposition     ED Disposition  Admit   Condition  --   [place]: Long [100100]  Level of Care: Telemetry Cardiac [103]  May place patient in observation at [facility] or  [place] if equivalent level of care is available:: No  Covid Evaluation: Asymptomatic - no recent exposure (last 10 days) testing not required  Diagnosis: Chest pain [id number]  Admitting Physician: Lequita Halt [id number]  Attending Physician: Lequita Halt [id number]          B Medical/Surgery History Past Medical History:  Diagnosis Date   Anemia    Anxiety    Bilateral carotid artery stenosis    Carotid duplex [id number]: [id number] LICA, [id number] RICA, [id number] RECA, f/u 1 yr suggested   CAD (coronary artery disease) of bypass graft 5/01; 3/'02, 8/'03, 10/'04; 1/15   PCI x 5 to SVG-D1    CAD in native artery 07/1993   3 Vessel Disease (LAD-D1 & RCA) -- CABG (Dx in setting of inferior STEMI-PTCA of RCA)   COPD mixed type ([place])    Followed by Dr. Lamonte Sakai "pulmonologist said no COPD"   Depression with anxiety    Diabetes mellitus type 2 in obese ([place])    Diarrhea    started after cholecystectomy and mass removed from intestine   Dyslipidemia, goal LDL below 70    08/2012: TC 137, TG 200, HDL 32!, LDL 45; on statin (followed by Dr.Deterding)   ESRD (end stage renal disease) ([place]) 1991   s/p Cadaveric Renal Transplant [facility] - Dr. 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  Had having having lots of nausea and vomiting with poor p.o. intake.  Thought to have QT prolongation with multiple medications involved and hypomagnesemia, hypokalemia.  Tikosyn discontinued along with Zoloft and Phenergan.   Hypertension associated with diabetes ([place])    Mild aortic stenosis by prior echocardiogram 07/2019   Echo:   Mild aortic stenosis (gradients: Mean 14.3 mmHg -peak 24.9 mmHg).   Morbid obesity ([facility])    MRSA (methicillin resistant staph aureus) culture positive    OSA (obstructive sleep apnea)    no longer on CPAP or home O2, states she doesn't need now after lap band   PAD (peripheral artery disease) ([place]) 08/2013   LEA Dopplers to be read by Dr. Fletcher Anon   PAF (paroxysmal atrial fibrillation) ([place]) 06/2014   Noted on CardioNet Monitor  - --> rhythm control with Tikosyn (Dr. Rayann Heman); converted from warfarin to apixaban for anticoagulation.   Pneumonia    Recurrent boils    Bilateral Groin   Rheumatoid arthritis ([place])    Per Patient Report; associated with OA   S/p cadaver renal transplant 1991   [facility]   Past Surgical History:  Procedure Laterality Date   ABDOMINAL AORTOGRAM N/A 04/21/2018   Procedure: ABDOMINAL AORTOGRAM;  Surgeon: [name], MD;  Location: [place] [facility];  Service: Cardiovascular;  Laterality: N/A;   CATHETER REMOVAL     CHOLECYSTECTOMY N/A 10/29/2014   Procedure: LAPAROSCOPIC CHOLECYSTECTOMY WITH INTRAOPERATIVE CHOLANGIOGRAM;  Surgeon: Excell Seltzer, MD;  Location: [facility];  Service: General;  Laterality: N/A;   CORONARY ANGIOPLASTY  1994   x5   CORONARY ARTERY BYPASS GRAFT  1995   LIMA-LAD, SVG-RPDA, SVG-D1   CORONARY STENT INTERVENTION N/A 02/20/2021   Procedure: PERCUTANEOUS CORONARY STENT INTERVENTION;  Surgeon: [name], MD;  Location: [place] [facility]; ostLCx 60% (Neg RFR 0.96);; SVG- D2 recurrent 90% ISR & 95% native D2 after graft-> DES PCI of 95% anastomotic D2 lesion (Onyx Frontier 2.25 mm x 12 mm => 2.75 mm @ overlap, 2.5 distal.);PTCA of ISR in body of graft. ->  2.5 mm scoring balloon & post-dil w/ 2.75 mm [place] balloon   ESOPHAGOGASTRODUODENOSCOPY N/A 10/15/2016   Procedure: ESOPHAGOGASTRODUODENOSCOPY (EGD);  Surgeon: Wilford Corner, MD;  Location: [facility] ENDOSCOPY;  Service: Endoscopy;  Laterality: N/A;   I & D EXTREMITY Right 01/29/2018    Procedure: IRRIGATION AND DEBRIDEMENT THUMB;  Surgeon: Dayna Barker, MD;  Location: [place];  Service: Plastics;  Laterality: Right;   INCISE AND DRAIN ABCESS     INTRAVASCULAR PRESSURE WIRE/FFR STUDY N/A 02/20/2021   Procedure: INTRAVASCULAR PRESSURE WIRE/FFR STUDY;  Surgeon: [name], MD;  Location: [place] [facility];  Service: Cardiovascular;  Laterality: N/A;   KIDNEY TRANSPLANT  1991   KNEE ARTHROSCOPY WITH LATERAL MENISECTOMY Left 12/03/2017   Procedure: LEFT KNEE ARTHROSCOPY WITH LATERAL MENISECTOMY;  Surgeon: Earlie Server, MD;  Location: [place];  Service: Orthopedics;  Laterality: Left;   LAPAROSCOPIC GASTRIC BANDING  04/2004; 10/'09, 2/'10   Port Replacement x 2   LEFT HEART CATH AND CORONARY ANGIOGRAPHY N/A 02/20/2021   Procedure: LEFT  HEART CATH AND CORONARY ANGIOGRAPHY;  Surgeon: [name], MD;  Location: [place] [facility];  Service: Cardiovascular;  Laterality: N/A;   LEFT HEART CATH AND CORS/GRAFTS ANGIOGRAPHY N/A 04/21/2018   Procedure: LEFT HEART CATH AND CORS/GRAFTS ANGIOGRAPHY;  Surgeon: [name], MD;  Location: [place] [facility];  Ost-Prox LAD 50% - proxLAD (pre & post D1) 100% CTO. Cx - patent, small OM1 (stable ~ ostial OM1 90%, too small for PCI) & 2 small LPL; Ost-distal RCA 100% CTO.  LIMA-LAD (not injected); SVG-dRCA patent, SVG-D1 - insertion stent ~20% ISR - Severe R CFA disease w/ focal Sub TO   LEFT HEART CATH AND CORS/GRAFTS ANGIOGRAPHY  5/'01, 3/'02, 8/'03, 10/'04; 1/'15   08/22/2013: LAD & RCA 100%; LIMA-LAD & SVG-rPDA patent; Cx-- OM1 60%, OM2 ostial ~50%; SVG-D1 - 80% mid, 50% distal ISR --PCI   LEFT HEART CATH AND CORS/GRAFTS ANGIOGRAPHY N/A 01/31/2021   Procedure: LEFT HEART CATH AND CORS/GRAFTS ANGIOGRAPHY;  Surgeon: Jolaine Artist, MD;  Location: [place] [facility];;   LEFT HEART CATH AND CORS/GRAFTS ANGIOGRAPHY N/A 09/05/2021   Procedure: LEFT HEART CATH AND CORS/GRAFTS ANGIOGRAPHY;  Surgeon: [name], MD;  Location: [place] [facility];  Service: Cardiovascular;  Laterality: N/A;   LEFT HEART CATH AND CORS/GRAFTS ANGIOGRAPHY N/A 08/21/2021   Procedure: LEFT HEART CATH AND CORS/GRAFTS ANGIOGRAPHY;  Surgeon: Belva Crome, MD;  Location: [place] [facility];  Service: Cardiovascular;  Laterality: N/A;   LEFT HEART CATHETERIZATION WITH CORONARY/GRAFT ANGIOGRAM N/A 08/23/2013   Procedure: LEFT HEART CATHETERIZATION WITH Beatrix Fetters;  Surgeon: Wellington Hampshire, MD;  Location: [place] CATH LAB;  Service: Cardiovascular;  Laterality: N/A;   Lower Extremity Arterial Dopplers  08/2013   ABI: R 0.96, L 1.04   MULTIPLE TOOTH EXTRACTIONS  age 8   NM MYOVIEW LTD  03/2016   EF 62%. LOW RISK. C/W prior MI - no Ischemia. Apical hypokinesis.   PERCUTANEOUS CORONARY STENT INTERVENTION (PCI-S)  5/'01, 3/'02, 8/'03, 10/'04;   '01 - S660 BMS 2.5 x 9 - dSVG-D1 into D1; '02- post-stent stenosis - 2.5 x 8 Pixel BMS; '8\03: ISR/Thrombosis into native D1 - AngioJet, 2.5 x 13 Pixel; '04 - ISR 95% - covered stented area with Taxus DES 2.5 mm x 20 (2.88)   PERCUTANEOUS CORONARY STENT INTERVENTION (PCI-S)  08/23/2013   Procedure: PERCUTANEOUS CORONARY STENT INTERVENTION (PCI-S);  Surgeon: Wellington Hampshire, MD;  Location: [facility] C[facility] LAB;  Service: Cardiovascular;;mid SVG-D1 80%; distal stent ~50% ISR; Promus [place]rm[place] DES 2.75 mm xc 20 mm (2.8 mm)   PORT-A-CATH REMOVAL     kidney   TRANSTHORACIC ECHOCARDIOGRAM  07/2019   a) 07/2019: EF 55 to 60%.  No LVH.  Paradoxical septal WM-s/p CABG.  GRII DD.  Nl RV size and fxn.  Mild bilateral atrial dilation.  Mod MAC.  Trace MR.  Mild AS (gradients: Mean 14.3 mmHg -peak 24.9 mmHg).; B) 06/2020: EF 40 to 45%.  Moderate concentric LVH.  GRII DD.  Elevated LAP.  Mod HK mid Apical Ant-AntSept wall & mild Apical Dyskinesis.  Mod LA dilation.  Mild MR.  AoV sclerosis w/o AS.   TRANSTHORACIC ECHOCARDIOGRAM  01/30/2021   EF 55 to 60%.  Mild LVH.  GR 1 DD.  Elevated LAP.  Moderate LA dilation.  Mild MR with  mild MS.  Mild aortic valve stenosis.:   TUBAL LIGATION     wrist fistula repair Left    dialysis for one year  A IV Location/Drains/Wounds Patient Lines/Drains/Airways Status     Active Line/Drains/Airways     Name Placement date Placement time Site Days   Peripheral IV 11/06/22 20 G Left Antecubital 11/06/22  0811  Antecubital  less than 1   Peripheral IV 11/06/22 20 G Anterior;Distal;Right Forearm 11/06/22  0841  Forearm  less than 1   Pressure Injury 10/18/21 Buttocks Right Unstageable - Full thickness tissue loss in which the base of the injury is covered by slough (yellow, tan, gray, green or brown) and/or eschar (tan, brown or black) in the wound bed. brown, yellow, tan wound bed 10/18/21  2337  -- 384   Pressure Injury 11/21/21 Heel Right brown colored wound bed 11/21/21  2000  -- 350            Intake/Output Last 24 hours No intake or output data in the 24 hours ending 11/06/22 1251  Labs/Imaging Results for orders placed or performed during the hospital encounter of 11/06/22 (from the past 48 hour(s))  CBC with Differential     Status: Abnormal   Collection Time: 11/06/22  8:21 AM  Result Value Ref Range   WBC 5.7 4.0 - 10.5 K/uL   RBC 3.65 (L) 3.87 - 5.11 MIL/uL   Hemoglobin 11.6 (L) 12.0 - 15.0 g/dL   HCT 34.7 (L) 36.0 - 46.0 %   MCV 95.1 80.0 - 100.0 fL   MCH 31.8 26.0 - 34.0 pg   MCHC 33.4 30.0 - 36.0 g/dL   RDW 17.3 (H) 11.5 - 15.5 %   Platelets 262 150 - 400 K/uL   nRBC 0.5 (H) 0.0 - 0.2 %   Neutrophils Relative % 70 %   Neutro Abs 4.0 1.7 - 7.7 K/uL   Lymphocytes Relative 20 %   Lymphs Abs 1.1 0.7 - 4.0 K/uL   Monocytes Relative 7 %   Monocytes Absolute 0.4 0.1 - 1.0 K/uL   Eosinophils Relative 2 %   Eosinophils Absolute 0.1 0.0 - 0.5 K/uL   Basophils Relative 0 %   Basophils Absolute 0.0 0.0 - 0.1 K/uL   Immature Granulocytes 1 %   Abs Immature Granulocytes 0.04 0.00 - 0.07 K/uL    Comment: Performed at [facility], [address], [place], [place] [address]  Brain natriuretic peptide     Status: Abnormal   Collection Time: 11/06/22  8:21 AM  Result Value Ref Range   B Natriuretic Peptide 262.3 (H) 0.0 - 100.0 pg/mL    Comment: Performed at [place] [address] [place], [place] [address]  I-Stat venous blood gas, ED     Status: Abnormal   Collection Time: 11/06/22  8:22 AM  Result Value Ref Range   pH, Ven 7.525 (H) 7.25 - 7.43   pCO2, Ven 35.7 (L) 44 - 60 mmHg   pO2, Ven 49 (H) 32 - 45 mmHg   Bicarbonate 29.5 (H) 20.0 - 28.0 mmol/L   TCO2 31 22 - 32 mmol/L   O2 Saturation 88 %   Acid-Base Excess 6.0 (H) 0.0 - 2.0 mmol/L   Sodium 134 (L) 135 - 145 mmol/L   Potassium 5.2 (H) 3.5 - 5.1 mmol/L   Calcium, Ion 0.95 (L) 1.15 - 1.40 mmol/L   HCT 32.0 (L) 36.0 - 46.0 %   Hemoglobin 10.9 (L) 12.0 - 15.0 g/dL   Sample type VENOUS   Protime-INR     Status: Abnormal   Collection Time: 11/06/22  8:25 AM  Result Value  Ref Range   Prothrombin Time 19.2 (H) 11.4 - 15.2 seconds   INR 1.6 (H) 0.8 - 1.2    Comment: (NOTE) INR goal varies based on device and disease states. Performed at [facility], [place] [address], [place], [place] [address]   Comprehensive metabolic panel     Status: Abnormal   Collection Time: 11/06/22  9:20 AM  Result Value Ref Range   Sodium 134 (L) 135 - 145 mmol/L   Potassium 3.3 (L) 3.5 - 5.1 mmol/L   Chloride 97 (L) 98 - 111 mmol/L   CO2 26 22 - 32 mmol/L   Glucose, Bld 174 (H) 70 - 99 mg/dL    Comment: Glucose reference range applies only to samples taken after fasting for at least 8 hours.   BUN 12 8 - 23 mg/dL   Creatinine, Ser 1.10 (H) 0.44 - 1.00 mg/dL   Calcium 8.2 (L) 8.9 - 10.3 mg/dL   Total Protein 6.4 (L) 6.5 - 8.1 g/dL   Albumin 2.7 (L) 3.5 - 5.0 g/dL   AST 17 15 - 41 U/L   ALT 7 0 - 44 U/L   Alkaline Phosphatase 82 38 - 126 U/L   Total Bilirubin 0.7 0.3 - 1.2 mg/dL   GFR, Estimated 56 (L) >60 mL/min    Comment: (NOTE) Calculated using the CKD-EPI  Creatinine Equation (2021)    Anion gap 11 5 - 15    Comment: Performed at [facility], [place] [address] [place], [place] [address]  Troponin I (High Sensitivity)     Status: Abnormal   Collection Time: 11/06/22  9:20 AM  Result Value Ref Range   Troponin I (High Sensitivity) 27 (H) <18 ng/L    Comment: (NOTE) Elevated high sensitivity troponin I (hsTnI) values and significant  changes across serial measurements may suggest ACS but many other  chronic and acute conditions are known to elevate hsTnI results.  Refer to the "Links" section for chest pain algorithms and additional  guidance. Performed at [facility], [place] [address] [place], [place] [address]    *Note: Due to a large number of results and/or encounters for the requested time period, some results have not been displayed. A complete set of results can be found in Results Review.   DG Chest Port 1 View  Result Date: 11/06/2022 CLINICAL DATA:  Chest pain and dyspnea EXAM: PORTABLE CHEST 1 VIEW COMPARISON:  02/02/2022 chest radiograph. FINDINGS: Stable mid to lower sternotomy wire discontinuities. CABG clips overlie the mediastinum. Stable cardiomediastinal silhouette with top-normal heart size. No pneumothorax. No pleural effusion. No acute consolidative airspace disease. No overt pulmonary edema. Chronic coarsened interstitial and patchy reticular opacities throughout both lungs. IMPRESSION: No acute cardiopulmonary disease. Chronic coarsened interstitial and patchy reticular opacities throughout both lungs, favor nonspecific scarring versus chronic interstitial lung disease. Electronically Signed   By: Ilona Sorrel M.D.   On: 11/06/2022 08:49    Pending Labs Unresulted Labs (From admission, onward)     Start     Ordered   11/07/22 0500  APTT  Daily,   R      11/06/22 1113   11/07/22 0500  Heparin level (unfractionated)  Daily,   R      11/06/22 1113   11/07/22 0500  CBC  Daily,   R      11/06/22 1113    11/06/22 1730  Heparin level (unfractionated)  Once-Timed,   URGENT  11/06/22 1113   11/06/22 1730  APTT  Once-Timed,   STAT        11/06/22 1113   11/06/22 0815  Urinalysis, w/ Reflex to Culture (Infection Suspected) -Urine, Clean Catch  Once,   URGENT       Question:  Specimen Source  Answer:  Urine, Clean Catch   11/06/22 0814            Vitals/Pain Today's Vitals   11/06/22 1030 11/06/22 1100 11/06/22 1130 11/06/22 1230  BP: 101/65 95/69 116/71 115/71  Pulse: 92 94 97 (!) 117  Resp: (!) 22 (!) 21    Temp:  (!) 96 F (35.6 C)    TempSrc:  Temporal    SpO2: 97% 97% 100% 95%  PainSc:        Isolation Precautions No active isolations  Medications Medications  clopidogrel (PLAVIX) tablet 75 mg (75 mg Oral Given 11/06/22 1136)  furosemide (LASIX) tablet 40 mg (has no administration in time range)  metoprolol tartrate (LOPRESSOR) tablet 25 mg (25 mg Oral Not Given 11/06/22 1045)  ranolazine (RANEXA) 12 hr tablet 1,000 mg (1,000 mg Oral Given 11/06/22 1139)  rosuvastatin (CRESTOR) tablet 20 mg (has no administration in time range)  isosorbide mononitrate (IMDUR) 24 hr tablet 60 mg (has no administration in time range)  heparin ADULT infusion 100 units/mL (25000 units/215mL) (1,000 Units/hr Intravenous New Bag/Given 11/06/22 1147)  acetaminophen (TYLENOL) tablet 650 mg (has no administration in time range)  ondansetron (ZOFRAN) injection 4 mg (has no administration in time range)  ALPRAZolam (XANAX) tablet 0.25 mg (has no administration in time range)  Oxycodone HCl TABS 10 mg (has no administration in time range)  cariprazine (VRAYLAR) capsule 1.5 mg (has no administration in time range)  LORazepam (ATIVAN) tablet 1 mg (has no administration in time range)  sertraline (ZOLOFT) tablet 200 mg (has no administration in time range)  Basaglar KwikPen KwikPen 30 Units (has no administration in time range)  predniSONE (DELTASONE) tablet 5 mg (has no administration in time range)   bisacodyl (DULCOLAX) EC tablet 5 mg (has no administration in time range)  pantoprazole (PROTONIX) EC tablet 40 mg (has no administration in time range)  azaTHIOprine (IMURAN) tablet 125 mg (has no administration in time range)  cyclobenzaprine (FLEXERIL) tablet 10 mg (has no administration in time range)  gabapentin (NEURONTIN) capsule 100 mg (has no administration in time range)  lamoTRIgine (LAMICTAL) tablet 25 mg (has no administration in time range)  protein supplement (ENSURE MAX) liquid (has no administration in time range)  potassium chloride (KLOR-CON M) CR tablet 10 mEq (has no administration in time range)  albuterol (VENTOLIN HFA) 108 (90 Base) MCG/ACT inhaler 1 puff (has no administration in time range)  arformoterol (BROVANA) nebulizer solution 15 mcg (has no administration in time range)  budesonide (PULMICORT) nebulizer solution 0.5 mg (has no administration in time range)  fluticasone (FLONASE) 50 MCG/ACT nasal spray 1 spray (has no administration in time range)  montelukast (SINGULAIR) tablet 10 mg (has no administration in time range)  promethazine (PHENERGAN) tablet 25 mg (has no administration in time range)  acetaminophen (TYLENOL) tablet 650 mg (has no administration in time range)  ondansetron (ZOFRAN) injection 4 mg (has no administration in time range)  acetaminophen (TYLENOL) tablet 1,000 mg (1,000 mg Oral Given 11/06/22 0904)    Mobility walks with device     Focused Assessments Cardiac Assessment Handoff:  Cardiac Rhythm: Sinus tachycardia Lab Results  Component Value Date   CKTOTAL 90 03/30/2021  CKMB 2.5 09/18/2010   TROPONINI <0.03 04/20/2018   Lab Results  Component Value Date   DDIMER 0.93 (H) 08/13/2021   Does the Patient currently have chest pain? Yes    R Recommendations: See Admitting Provider Note  Report given to: [id number]

## 2022-11-06 NOTE — Progress Notes (Signed)
[place] for heparin Indication: atrial fibrillation  Allergies  Allergen Reactions   Tetracycline Hives    Patient tolerated Doxycycline Dec 2020   Niacin Other (See Comments)    Mouth blisters   Niaspan [Niacin Er] Other (See Comments)    Mouth blisters   Sulfa Antibiotics Nausea Only and Other (See Comments)    "Tears up stomach"   Sulfonamide Derivatives Other (See Comments)    Reaction: per patient "tears her stomach up"   Codeine Nausea And Vomiting   Erythromycin Nausea And Vomiting   Hydromorphone Hcl Nausea And Vomiting   Morphine And Related Nausea And Vomiting   Nalbuphine Nausea And Vomiting    Nubain   Sulfasalazine Nausea Only and Other (See Comments)    per patient "tears her stomach up", "Tears up stomach"   Tape Rash and Other (See Comments)    No "plastic" tape," please----cloth tape only   Patient Measurements: Weight: 80.4 kg (177 lb 4.8 oz) Heparin Dosing Weight: 66.4 kg  Vital Signs: Temp: 98 F (36.7 C) (03/22 1700) Temp Source: Oral (03/22 1700) BP: 108/67 (03/22 1937) Pulse Rate: 67 (03/22 1937)  Labs: Recent Labs    11/06/22 0821 11/06/22 0822 11/06/22 0825 11/06/22 0920 11/06/22 1539 11/06/22 1803  HGB 11.6* 10.9*  --   --   --   --   HCT 34.7* 32.0*  --   --   --   --   PLT 262  --   --   --   --   --   APTT  --   --   --   --   --  32  LABPROT  --   --  19.2*  --   --   --   INR  --   --  1.6*  --   --   --   HEPARINUNFRC  --   --   --   --   --  >1.10*  CREATININE  --   --   --  1.10*  --   --   TROPONINIHS  --   --   --  27* 257*  --     Estimated Creatinine Clearance: 48.9 mL/min (A) (by C-G formula based on SCr of 1.1 mg/dL (H)).  Assessment: RS is a 67 year old female admitted with chest discomfort. Patient has a history of atrial fibrillation on Eliquis prior to admission. Patient reports last Eliquis dose 3/21 evening. With recent Eliquis use will not bolus heparin and will monitor  aPTT until correlating with heparin level. Hgb 10.9, plt 262.  PTT 107 seconds  Goal of Therapy:  Heparin level 0.3-0.7 units/ml aPTT 66-102 seconds Monitor platelets by anticoagulation protocol: Yes   Plan:  Decrease heparin to 900 units / hr Daily CBC, aPTT, and heparin level. Monitor for signs/symptoms of bleeding.   Thank you Anette Guarneri, PharmD 11/06/2022,7:54 PM

## 2022-11-06 NOTE — ED Triage Notes (Addendum)
Pt BIB [facility] from home d/t generalized CP that started at 0600,pt denies radiation but EMS reports she takes a Nitro "almost daily" & it alleviates her back/shoulder/CP. Pt reports she is under a lot of stress d/t loss of a family member, does have significant cardiac Hx, A/Ox4. Denies n/v, endorses having a recent respiratory infection lately & taking ABT, wears 7L O2 at baseline & she has foul smelling dark urine. Upon arrival to ED pt reports jaw pain. Was given 324 ASA, 20g Lt AC, 117/81.

## 2022-11-06 NOTE — Progress Notes (Signed)
Patient refused the use of CPAP for the evening.  

## 2022-11-06 NOTE — Progress Notes (Addendum)
ANTICOAGULATION CONSULT NOTE - Initial Consult  Pharmacy Consult for heparin Indication: atrial fibrillation  Allergies  Allergen Reactions   Tetracycline Hives    Patient tolerated Doxycycline Dec 2020   Niacin Other (See Comments)    Mouth blisters   Niaspan [Niacin Er] Other (See Comments)    Mouth blisters   Sulfa Antibiotics Nausea Only and Other (See Comments)    "Tears up stomach"   Sulfonamide Derivatives Other (See Comments)    Reaction: per patient "tears her stomach up"   Codeine Nausea And Vomiting   Erythromycin Nausea And Vomiting   Hydromorphone Hcl Nausea And Vomiting   Morphine And Related Nausea And Vomiting   Nalbuphine Nausea And Vomiting    Nubain   Sulfasalazine Nausea Only and Other (See Comments)    per patient "tears her stomach up", "Tears up stomach"   Tape Rash and Other (See Comments)    No "plastic" tape," please----cloth tape only   Patient Measurements:   Heparin Dosing Weight: 66.4 kg  Vital Signs: Temp: 98.5 F (36.9 C) (03/22 0807) Temp Source: Oral (03/22 0807) BP: 101/65 (03/22 1030) Pulse Rate: 92 (03/22 1030)  Labs: Recent Labs    11/06/22 0821 11/06/22 0822 11/06/22 0825  HGB 11.6* 10.9*  --   HCT 34.7* 32.0*  --   PLT 262  --   --   LABPROT  --   --  19.2*  INR  --   --  1.6*   CrCl cannot be calculated (Patient's most recent lab result is older than the maximum 21 days allowed.).  Medical History: Past Medical History:  Diagnosis Date   Anemia    Anxiety    Bilateral carotid artery stenosis    Carotid duplex [id number]: [id number] LICA, [id number] RICA, [id number] RECA, f/u 1 yr suggested   CAD (coronary artery disease) of bypass graft 5/01; 3/'02, 8/'03, 10/'04; 1/15   PCI x 5 to SVG-D1    CAD in native artery 07/1993   3 Vessel Disease (LAD-D1 & RCA) -- CABG (Dx in setting of inferior STEMI-PTCA of RCA)   COPD mixed type ([place])    Followed by Dr. Lamonte Sakai "pulmonologist said no COPD"   Depression with anxiety    Diabetes mellitus  type 2 in obese ([place])    Diarrhea    started after cholecystectomy and mass removed from intestine   Dyslipidemia, goal LDL below 70    08/2012: TC 137, TG 200, HDL 32!, LDL 45; on statin (followed by Dr.Deterding)   ESRD (end stage renal disease) ([place]) 1991   s/p Cadaveric Renal Transplant [facility] - Dr. 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  Had having having lots of nausea and vomiting with poor p.o. intake.  Thought to have QT prolongation with multiple medications involved and hypomagnesemia, hypokalemia.  Tikosyn discontinued along with Zoloft and Phenergan.   Hypertension associated with diabetes ([place])    Mild aortic stenosis by prior echocardiogram 07/2019   Echo:  Mild aortic stenosis (gradients: Mean 14.3 mmHg -peak 24.9 mmHg).   Morbid  obesity ([facility])    MRSA (methicillin resistant staph aureus) culture positive    OSA (obstructive sleep apnea)    no longer on CPAP or home O2, states she doesn't need now after lap band   PAD (peripheral artery disease) ([place]) 08/2013   LEA Dopplers to be read by Dr. Fletcher Anon   PAF (paroxysmal atrial fibrillation) ([place]) 06/2014   Noted on CardioNet Monitor  - --> rhythm control with Tikosyn (Dr. Rayann Heman); converted from warfarin to apixaban for anticoagulation.   Pneumonia    Recurrent boils    Bilateral Groin   Rheumatoid arthritis ([employer])    Per Patient Report; associated with OA   S/p cadaver renal transplant 1991   [facility]   Medications:  Infusions:   heparin     Assessment: Katherine [name] is a 67 year old female admitted with chest discomfort. Patient has a  history of atrial fibrillation on Eliquis prior to admission. Patient reports last Eliquis dose 3/21 evening. With recent Eliquis use will not bolus heparin and will monitor aPTT until correlating with heparin level. Hgb 10.9, plt 262.  Goal of Therapy:  Heparin level 0.3-0.7 units/ml aPTT 66-102 seconds Monitor platelets by anticoagulation protocol: Yes   Plan:  Start heparin 1000 units/hr. Check 6 aPTT and heparin level.  Daily CBC, aPTT, and heparin level. Monitor for signs/symptoms of bleeding.   Katherine [name] 0000000 AM

## 2022-11-06 NOTE — Progress Notes (Addendum)
Notified by nursing staff for elevation in f/u troponin level, 27->257. Consult note reviewed, prior plan for medical therapy of CP/elevated troponin and heparin. Per nurse, pt is not having any chest pain. Will repeat value in about 2-3 hours to trend to peak with care order to call result to our team. Nurse aware to notify for any recurrent CP. Repeat K ordered by medicine team also resulted at 2.9, nurse will contact primary team to assist in management.

## 2022-11-06 NOTE — ED Notes (Signed)
Cards at bedside

## 2022-11-06 NOTE — H&P (Signed)
History and Physical    Katherine [name] [id number] DOB: 09-17-1955 DOA: 11/06/2022  PCP: [name], MD (Confirm with patient/family/NH records and if not entered, this has to be entered at [facility] point of entry) Patient coming from: Home  I have personally briefly reviewed patient's old medical records in [place]  Chief Complaint: Chest pain  HPI: Katherine [name] is a 67 y.o. female with medical history significant of chronic hypoxic respiratory failure on 2 L continuously, COPD Gold stage I, CAD status post CABG and engraft stenting, PAD, OSA, HLD, chronic combined HFrEF and HFpEF, PAF on Eliquis, ESRD status post renal transplant, anxiety/depression, CVA, presented with chest pain.  THIS morning, patient woke up with severe pressure-like chest pain, " allover my chest" associated with increasing shortness of breath and palpitations.  No cough no abdominal pain no nausea vomiting or diarrhea.  Patient also complaining about foul smell urine for few days.  Last month, patient was treated for URI with antibiotics.  Recently, patient has been under a lot of stress in her life, she has been on benzos but still feels very "stressed out".  En route to ED, code heart was called by EMS, and patient was given nitroglycerin en route and chest pain subsided.  In the ED comparison of EKG, patient has a chronic LBBB, troponin first at 27.  Code heart was called off by cardiology.  Patient was started on heparin drip  Review of Systems: As per HPI otherwise 14 point review of systems negative.    Past Medical History:  Diagnosis Date   Anemia    Anxiety    Bilateral carotid artery stenosis    Carotid duplex [id number]: [id number] LICA, [id number] RICA, [id number] RECA, f/u 1 yr suggested   CAD (coronary artery disease) of bypass graft 5/01; 3/'02, 8/'03, 10/'04; 1/15   PCI x 5 to SVG-D1    CAD in native artery 07/1993   3 Vessel Disease (LAD-D1 & RCA) -- CABG (Dx in setting of inferior STEMI-PTCA  of RCA)   COPD mixed type ([place])    Followed by Dr. Lamonte Sakai "pulmonologist said no COPD"   Depression with anxiety    Diabetes mellitus type 2 in obese ([place])    Diarrhea    started after cholecystectomy and mass removed from intestine   Dyslipidemia, goal LDL below 70    08/2012: TC 137, TG 200, HDL 32!, LDL 45; on statin (followed by Dr.Deterding)   ESRD (end stage renal disease) ([place]) 1991   s/p Cadaveric Renal Transplant [facility] - Dr. 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  Had having having lots of nausea and vomiting with poor p.o. intake.  Thought to have QT prolongation with multiple medications involved and hypomagnesemia, hypokalemia.  Tikosyn discontinued along with Zoloft and Phenergan.   Hypertension associated with diabetes ([place])    Mild aortic stenosis by prior echocardiogram 07/2019   Echo:  Mild aortic stenosis (gradients: Mean 14.3 mmHg -peak 24.9 mmHg).   Morbid obesity (H[facility])    MRSA (methicillin resistant  staph aureus) culture positive    OSA (obstructive sleep apnea)    no longer on CPAP or home O2, states she doesn't need now after lap band   PAD (peripheral artery disease) ([place]) 08/2013   LEA Dopplers to be read by Dr. Fletcher Anon   PAF (paroxysmal atrial fibrillation) ([place]) 06/2014   Noted on CardioNet Monitor  - --> rhythm control with Tikosyn (Dr. Rayann Heman); converted from warfarin to apixaban for anticoagulation.   Pneumonia    Recurrent boils    Bilateral Groin   Rheumatoid arthritis ([place])    Per Patient Report; associated with OA   S/p cadaver renal transplant 1991   [facility]     Past Surgical History:  Procedure Laterality Date   ABDOMINAL AORTOGRAM N/A 04/21/2018   Procedure: ABDOMINAL AORTOGRAM;  Surgeon: [name], MD;  Location: [facility];  Service: Cardiovascular;  Laterality: N/A;   CATHETER REMOVAL     CHOLECYSTECTOMY N/A 10/29/2014   Procedure: LAPAROSCOPIC CHOLECYSTECTOMY WITH INTRAOPERATIVE CHOLANGIOGRAM;  Surgeon: Excell Seltzer, MD;  Location: [facility];  Service: General;  Laterality: N/A;   CORONARY ANGIOPLASTY  1994   x5   CORONARY ARTERY BYPASS GRAFT  1995   LIMA-LAD, SVG-RPDA, SVG-D1   CORONARY STENT INTERVENTION N/A 02/20/2021   Procedure: PERCUTANEOUS CORONARY STENT INTERVENTION;  Surgeon: [name], MD;  Location: [place] [facility]; ostLCx 60% (Neg RFR 0.96);; SVG- D2 recurrent 90% ISR & 95% native D2 after graft-> DES PCI of 95% anastomotic D2 lesion (Onyx Frontier 2.25 mm x 12 mm => 2.75 mm @ overlap, 2.5 distal.);PTCA of ISR in body of graft. ->  2.5 mm scoring balloon & post-dil w/ 2.75 mm [place] balloon   ESOPHAGOGASTRODUODENOSCOPY N/A 10/15/2016   Procedure: ESOPHAGOGASTRODUODENOSCOPY (EGD);  Surgeon: Wilford Corner, MD;  Location: [facility];  Service: Endoscopy;  Laterality: N/A;   I & D EXTREMITY Right 01/29/2018   Procedure: IRRIGATION AND DEBRIDEMENT THUMB;  Surgeon: Dayna Barker, MD;  Location: [place];  Service: Plastics;  Laterality: Right;   INCISE AND DRAIN ABCESS     INTRAVASCULAR PRESSURE WIRE/FFR STUDY N/A 02/20/2021   Procedure: INTRAVASCULAR PRESSURE WIRE/FFR STUDY;  Surgeon: [name], MD;  Location: [place] [facility];  Service: Cardiovascular;  Laterality: N/A;   KIDNEY TRANSPLANT  1991   KNEE ARTHROSCOPY WITH LATERAL MENISECTOMY Left 12/03/2017   Procedure: LEFT KNEE ARTHROSCOPY WITH LATERAL MENISECTOMY;  Surgeon: Earlie Server, MD;  Location: [place];  Service: Orthopedics;  Laterality: Left;   LAPAROSCOPIC GASTRIC BANDING  04/2004; 10/'09, 2/'10   Port Replacement x 2   LEFT HEART  CATH AND CORONARY ANGIOGRAPHY N/A 02/20/2021   Procedure: LEFT HEART CATH AND CORONARY ANGIOGRAPHY;  Surgeon: [name], MD;  Location: [place] [facility];  Service: Cardiovascular;  Laterality: N/A;   LEFT HEART CATH AND CORS/GRAFTS ANGIOGRAPHY N/A 04/21/2018   Procedure: LEFT HEART CATH AND CORS/GRAFTS ANGIOGRAPHY;  Surgeon: [name], MD;  Location: [place] [facility];  Ost-Prox LAD 50% - proxLAD (pre & post D1) 100% CTO. Cx - patent, small OM1 (stable ~ ostial OM1 90%, too small for PCI) & 2 small LPL; Ost-distal RCA 100% CTO.  LIMA-LAD (not injected); SVG-dRCA patent, SVG-D1 - insertion stent ~20% ISR - Severe R CFA disease w/ focal Sub TO   LEFT HEART CATH AND CORS/GRAFTS ANGIOGRAPHY  5/'01, 3/'02, 8/'03, 10/'04; 1/'15   08/22/2013: LAD & RCA 100%; LIMA-LAD & SVG-rPDA patent; Cx-- OM1 60%, OM2 ostial ~50%; SVG-D1 - 80%  mid, 50% distal ISR --PCI   LEFT HEART CATH AND CORS/GRAFTS ANGIOGRAPHY N/A 01/31/2021   Procedure: LEFT HEART CATH AND CORS/GRAFTS ANGIOGRAPHY;  Surgeon: Jolaine Artist, MD;  Location: [place] [facility];;   LEFT HEART CATH AND CORS/GRAFTS ANGIOGRAPHY N/A 09/05/2021   Procedure: LEFT HEART CATH AND CORS/GRAFTS ANGIOGRAPHY;  Surgeon: [name], MD;  Location: [place] [facility];  Service: Cardiovascular;  Laterality: N/A;   LEFT HEART CATH AND CORS/GRAFTS ANGIOGRAPHY N/A 08/21/2021   Procedure: LEFT HEART CATH AND CORS/GRAFTS ANGIOGRAPHY;  Surgeon: Belva Crome, MD;  Location: [place] [facility];  Service: Cardiovascular;  Laterality: N/A;   LEFT HEART CATHETERIZATION WITH CORONARY/GRAFT ANGIOGRAM N/A 08/23/2013   Procedure: LEFT HEART CATHETERIZATION WITH Beatrix Fetters;  Surgeon: Wellington Hampshire, MD;  Location: [place] C[facility];  Service: Cardiovascular;  Laterality: N/A;   Lower Extremity Arterial Dopplers  08/2013   ABI: R 0.96, L 1.04   MULTIPLE TOOTH EXTRACTIONS  age 52   NM MYOVIEW LTD  03/2016   EF 62%. LOW RISK. C/W prior MI - no Ischemia.  Apical hypokinesis.   PERCUTANEOUS CORONARY STENT INTERVENTION (PCI-S)  5/'01, 3/'02, 8/'03, 10/'04;   '01 - S660 BMS 2.5 x 9 - dSVG-D1 into D1; '02- post-stent stenosis - 2.5 x 8 Pixel BMS; '8\03: ISR/Thrombosis into native D1 - AngioJet, 2.5 x 13 Pixel; '04 - ISR 95% - covered stented area with Taxus DES 2.5 mm x 20 (2.88)   PERCUTANEOUS CORONARY STENT INTERVENTION (PCI-S)  08/23/2013   Procedure: PERCUTANEOUS CORONARY STENT INTERVENTION (PCI-S);  Surgeon: Wellington Hampshire, MD;  Location: [facility] CATH LAB;  Service: Cardiovascular;;mid SVG-D1 80%; distal stent ~50% ISR; Promus Prermier DES 2.75 mm xc 20 mm (2.8 mm)   PORT-A-CATH REMOVAL     kidney   TRANSTHORACIC ECHOCARDIOGRAM  07/2019   a) 07/2019: EF 55 to 60%.  No LVH.  Paradoxical septal WM-s/p CABG.  GRII DD.  Nl RV size and fxn.  Mild bilateral atrial dilation.  Mod MAC.  Trace MR.  Mild AS (gradients: Mean 14.3 mmHg -peak 24.9 mmHg).; B) 06/2020: EF 40 to 45%.  Moderate concentric LVH.  GRII DD.  Elevated LAP.  Mod HK mid Apical Ant-AntSept wall & mild Apical Dyskinesis.  Mod LA dilation.  Mild MR.  AoV sclerosis w/o AS.   TRANSTHORACIC ECHOCARDIOGRAM  01/30/2021   EF 55 to 60%.  Mild LVH.  GR 1 DD.  Elevated LAP.  Moderate LA dilation.  Mild MR with mild MS.  Mild aortic valve stenosis.:   TUBAL LIGATION     wrist fistula repair Left    dialysis for one year     reports that she quit smoking about 20 years ago. Her smoking use included cigarettes. She has a 30.00 pack-year smoking history. She has never used smokeless tobacco. She reports that she does not drink alcohol and does not use drugs.  Allergies  Allergen Reactions   Tetracycline Hives    Patient tolerated Doxycycline Dec 2020   Niacin Other (See Comments)    Mouth blisters   Niaspan [Niacin Er] Other (See Comments)    Mouth blisters   Sulfa Antibiotics Nausea Only and Other (See Comments)    "Tears up stomach"   Sulfonamide Derivatives Other (See Comments)    Reaction:  per patient "tears her stomach up"   Codeine Nausea And Vomiting   Erythromycin Nausea And Vomiting   Hydromorphone Hcl Nausea And Vomiting   Morphine And Related Nausea And  Vomiting   Nalbuphine Nausea And Vomiting    Nubain   Sulfasalazine Nausea Only and Other (See Comments)    per patient "tears her stomach up", "Tears up stomach"   Tape Rash and Other (See Comments)    No "plastic" tape," please----cloth tape only    Family History  Problem Relation Age of Onset   Cancer Mother        liver   Heart disease Father    Cancer Father        colon   Arrhythmia Brother        Atrial Fibrillation   Arrhythmia Paternal Aunt        Atrial Fibrillation     Prior to Admission medications   Medication Sig Start Date End Date Taking? Authorizing Provider  albuterol (VENTOLIN HFA) 108 (90 Base) MCG/ACT inhaler TAKE 2 PUFFS BY MOUTH EVERY 6 HOURS AS NEEDED FOR WHEEZE OR SHORTNESS OF BREATH 07/30/22   Collene Gobble, MD  amoxicillin-clavulanate (AUGMENTIN) 875-125 MG tablet Take 1 tablet by mouth 2 (two) times daily. 10/14/22   Biagio Borg, MD  arformoterol (BROVANA) 15 MCG/2ML NEBU Take 2 mLs (15 mcg total) by nebulization 2 (two) times daily. 08/30/19   Collene Gobble, MD  azaTHIOprine (IMURAN) 50 MG tablet Take 2 & 1/2 tablets (125 mg) by mouth daily at 3pm Patient taking differently: Take 125 mg by mouth daily. 10/21/21   Pokhrel, Corrie Mckusick, MD  bisacodyl (DULCOLAX) 5 MG EC tablet Take 1 tablet (5 mg total) by mouth daily as needed for moderate constipation. 10/05/21   Allie Bossier, MD  budesonide (PULMICORT) 0.5 MG/2ML nebulizer solution Take 2 mLs (0.5 mg total) by nebulization 2 (two) times daily. Patient taking differently: Take 2 mLs by nebulization 2 (two) times daily as needed (wheezing). 08/24/19   [name], MD  calcitRIOL (ROCALTROL) 0.25 MCG capsule Take 0.25 mcg by mouth See admin instructions. Takes every 3 days.    [provider]  cariprazine (VRAYLAR) 1.5 MG  capsule Take 1 capsule (1.5 mg total) by mouth daily. 06/16/22   [name], MD  clopidogrel (PLAVIX) 75 MG tablet Take 75 mg by mouth daily.    [provider]  Continuous Blood Gluc Receiver (FREESTYLE LIBRE 2 READER) DEVI 1 Act by Does not apply route daily. 06/17/22   [name], MD  Continuous Blood Gluc Sensor (FREESTYLE LIBRE 2 SENSOR) MISC 1 Act by Does not apply route daily. 06/17/22   [name], MD  [employer] VITAMIN C 500 MG tablet TAKE 1 TABLET (500 MG TOTAL) BY MOUTH DAILY. 02/03/22   [name], MD  cyclobenzaprine (FLEXERIL) 10 MG tablet TAKE 1 TABLET BY MOUTH TWICE A DAY AS NEEDED FOR MUSCLE SPASMS 09/14/22   [name], MD  ELIQUIS 5 MG TABS tablet TAKE 1 TABLET BY MOUTH TWICE A DAY 10/22/22   [name], MD  Ensure Max Protein (ENSURE MAX PROTEIN) LIQD Take 330 mLs (11 oz total) by mouth 2 (two) times daily. 12/12/21   Mariel Aloe, MD  fluticasone (FLONASE) 50 MCG/ACT nasal spray PLACE 2 SPRAYS INTO BOTH NOSTRILS 2 TIMES DAILY. 08/04/22   [name], MD  furosemide (LASIX) 40 MG tablet TAKE 1 TABLET BY MOUTH EVERY DAY 08/10/22   [name], MD  gabapentin (NEURONTIN) 100 MG capsule Take 1 capsule (100 mg total) by mouth at bedtime. 09/11/21   Hosie Poisson, MD  glimepiride (AMARYL) 4 MG tablet  Take 4 mg by mouth daily with breakfast.    [provider]  insulin aspart (NOVOLOG FLEXPEN) 100 UNIT/ML FlexPen Inject 4-12 Units into the skin 3 (three) times daily with meals. Sliding scale.    [provider]  Insulin Glargine (BASAGLAR KWIKPEN) 100 UNIT/ML Inject 30 Units into the skin daily. 06/17/22   [name], MD  Insulin Pen Needle (PEN NEEDLES) 32G X 4 MM MISC Use as needed to administer insulin. E11.9 02/01/22   [name], MD  isosorbide mononitrate (IMDUR) 60 MG 24 hr tablet TAKE 1 TABLET BY MOUTH EVERY DAY 07/13/22   [name], MD  KLOR-CON M10 10 MEQ tablet TAKE 1 TABLET BY MOUTH 2 TIMES DAILY. 09/19/22    [name], MD  lamoTRIgine (LAMICTAL) 25 MG tablet TAKE 1 TABLET BY MOUTH EVERYDAY AT BEDTIME 02/19/22   Meredith Staggers, MD  LORazepam (ATIVAN) 1 MG tablet Take 1 tablet (1 mg total) by mouth every 8 (eight) hours as needed for anxiety. 09/26/22   [name], MD  metFORMIN (GLUCOPHAGE) 500 MG tablet Take 500 mg by mouth 2 (two) times daily. 12/08/21   [provider]  metoprolol tartrate (LOPRESSOR) 25 MG tablet Take 1 tablet (25 mg total) by mouth 3 (three) times daily. 10/26/22   [name], MD  montelukast (SINGULAIR) 10 MG tablet TAKE 1 TABLET BY MOUTH EVERYDAY AT BEDTIME 05/04/22   Collene Gobble, MD  Multiple Vitamin (MULTIVITAMIN WITH MINERALS) TABS tablet Take 1 tablet by mouth daily. 12/13/21   Mariel Aloe, MD  nitroGLYCERIN (NITROSTAT) 0.4 MG SL tablet PLACE 1 TAB UNDER TONGUE EVERY 5MINS FOR CHEST PAIN 06/30/22   [name], MD  Oxycodone HCl 10 MG TABS Take 1 tablet (10 mg total) by mouth every 8 (eight) hours as needed. 11/04/22   [name], MD  OXYGEN Inhale 4 L into the lungs continuous.    [provider]  pantoprazole (PROTONIX) 40 MG tablet TAKE 1 TABLET BY MOUTH EVERY DAY 08/05/22   [name], MD  predniSONE (DELTASONE) 5 MG tablet Take 5 mg by mouth daily with breakfast.    [provider]  promethazine (PHENERGAN) 25 MG tablet Take 1 tablet (25 mg total) by mouth every 6 (six) hours as needed for nausea or vomiting. 05/20/22   Haydee Salter, MD  ranolazine (RANEXA) 1000 MG SR tablet TAKE 1 TABLET BY MOUTH TWICE A DAY 10/31/22   [name], MD  rosuvastatin (CRESTOR) 20 MG tablet TAKE 1 TABLET BY MOUTH EVERY DAY 07/29/22   [name], MD  sertraline (ZOLOFT) 100 MG tablet Take 2 tablets (200 mg total) by mouth daily. 06/16/22   [name], MD    Physical Exam: Vitals:   11/06/22 1000 11/06/22 1030 11/06/22 1100 11/06/22 1130  BP: 109/77 101/65 95/69 116/71  Pulse: (!) 114 92 94 97  Resp:  (!) 22  (!) 21   Temp:   (!) 96 F (35.6 C)   TempSrc:   Temporal   SpO2: 93% 97% 97% 100%    Constitutional: NAD, calm, comfortable Vitals:   11/06/22 1000 11/06/22 1030 11/06/22 1100 11/06/22 1130  BP: 109/77 101/65 95/69 116/71  Pulse: (!) 114 92 94 97  Resp:  (!) 22 (!) 21   Temp:   (!) 96 F (35.6 C)   TempSrc:   Temporal   SpO2: 93% 97% 97% 100%   Eyes: PERRL, lids and  conjunctivae normal ENMT: Mucous membranes are moist. Posterior pharynx clear of any exudate or lesions.Normal dentition.  Neck: normal, supple, no masses, no thyromegaly Respiratory: clear to auscultation bilaterally, no wheezing, no crackles. Normal respiratory effort. No accessory muscle use.  Cardiovascular: Regular rate and rhythm, no murmurs / rubs / gallops. No extremity edema. 2+ pedal pulses. No carotid bruits.  Abdomen: no tenderness, no masses palpated. No hepatosplenomegaly. Bowel sounds positive.  Musculoskeletal: no clubbing / cyanosis. No joint deformity upper and lower extremities. Good ROM, no contractures. Normal muscle tone.  Skin: lesion measuring about 8x10 cm with an ulcer in the center located in the forearm upper 1/3, no rash around, no foul smell. Neurologic: CN 2-12 grossly intact. Sensation intact, DTR normal. Strength 5/5 in all 4.  Psychiatric: Normal judgment and insight. Alert and oriented x 3. Normal mood.      Labs on Admission: I have personally reviewed following labs and imaging studies  CBC: Recent Labs  Lab 11/06/22 0821 11/06/22 0822  WBC 5.7  --   NEUTROABS 4.0  --   HGB 11.6* 10.9*  HCT 34.7* 32.0*  MCV 95.1  --   PLT 262  --    Basic Metabolic Panel: Recent Labs  Lab 11/06/22 0822 11/06/22 0920  NA 134* 134*  K 5.2* 3.3*  CL  --  97*  CO2  --  26  GLUCOSE  --  174*  BUN  --  12  CREATININE  --  1.10*  CALCIUM  --  8.2*   GFR: CrCl cannot be calculated (Unknown ideal weight.). Liver Function Tests: Recent Labs  Lab 11/06/22 0920  AST 17  ALT 7   ALKPHOS 82  BILITOT 0.7  PROT 6.4*  ALBUMIN 2.7*   No results for input(s): "LIPASE", "AMYLASE" in the last 168 hours. No results for input(s): "AMMONIA" in the last 168 hours. Coagulation Profile: Recent Labs  Lab 11/06/22 0825  INR 1.6*   Cardiac Enzymes: No results for input(s): "CKTOTAL", "CKMB", "CKMBINDEX", "TROPONINI" in the last 168 hours. BNP (last 3 results) No results for input(s): "PROBNP" in the last 8760 hours. HbA1C: No results for input(s): "HGBA1C" in the last 72 hours. CBG: No results for input(s): "GLUCAP" in the last 168 hours. Lipid Profile: No results for input(s): "CHOL", "HDL", "LDLCALC", "TRIG", "CHOLHDL", "LDLDIRECT" in the last 72 hours. Thyroid Function Tests: No results for input(s): "TSH", "T4TOTAL", "FREET4", "T3FREE", "THYROIDAB" in the last 72 hours. Anemia Panel: No results for input(s): "VITAMINB12", "FOLATE", "FERRITIN", "TIBC", "IRON", "RETICCTPCT" in the last 72 hours. Urine analysis:    Component Value Date/Time   COLORURINE YELLOW 06/16/2022 1559   APPEARANCEUR Cloudy (A) 06/16/2022 1559   LABSPEC 1.015 06/16/2022 1559   PHURINE 5.5 06/16/2022 1559   GLUCOSEU 250 (A) 06/16/2022 1559   HGBUR NEGATIVE 06/16/2022 1559   BILIRUBINUR NEGATIVE 06/16/2022 1559   KETONESUR NEGATIVE 06/16/2022 1559   PROTEINUR NEGATIVE 09/20/2021 1429   UROBILINOGEN 0.2 06/16/2022 1559   NITRITE NEGATIVE 06/16/2022 1559   LEUKOCYTESUR SMALL (A) 06/16/2022 1559    Radiological Exams on Admission: DG Chest Port 1 View  Result Date: 11/06/2022 CLINICAL DATA:  Chest pain and dyspnea EXAM: PORTABLE CHEST 1 VIEW COMPARISON:  02/02/2022 chest radiograph. FINDINGS: Stable mid to lower sternotomy wire discontinuities. CABG clips overlie the mediastinum. Stable cardiomediastinal silhouette with top-normal heart size. No pneumothorax. No pleural effusion. No acute consolidative airspace disease. No overt pulmonary edema. Chronic coarsened interstitial and patchy  reticular opacities throughout both lungs. IMPRESSION: No  acute cardiopulmonary disease. Chronic coarsened interstitial and patchy reticular opacities throughout both lungs, favor nonspecific scarring versus chronic interstitial lung disease. Electronically Signed   By: Ilona Sorrel M.D.   On: 11/06/2022 08:49    EKG: Independently reviewed. LBBB chronic  Assessment/Plan Active Problems:   Unstable angina; recurrent   CAD (coronary artery disease) of bypass graft   Paroxysmal atrial fibrillation (HCC); CHA2DS2VASc score F, HTN, CAD, CVA = 5   Type 2 diabetes mellitus with hyperlipidemia (HCC)   LBBB (left bundle branch block)   COPD (chronic obstructive pulmonary disease) (HCC)   CHF exacerbation (HCC)   Chest pain  (please populate well all problems here in Problem List. (For example, if patient is on BP meds at home and you resume or decide to hold them, it is a problem that needs to be her. Same for CAD, COPD, HLD and so on)  Anginal-like chest pain -With a baseline extensive CAD history status post CABG multiple stenting including engraft stenting, agreed with heparin drip and other ACS meds as per cardiology. -Echocardiogram -Cath as per cardiology -Continue Plavix, patient has severe allergy to aspirin. -Continue ranolazine and imdur  Left arm lesion -Patient started to notice left arm lesion about 4 months ago and progressively getting larger with occasional pain.  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  Patient agreed with the plan  UTI -Will treat with ceftriaxone while waiting for UA and urine culture  Hypernatremia versus hyponatremia -Initial K5.2, repeated K3.3, recheck K level this afternoon  Anxiety/depression -Poorly controlled, very symptomatic likely due to recent swimming and stress in personal life -Increase her PRN Xanax dosage -SSRI and lamotrigine  Chronic combined HFrEF and  HFpEF -Euvolemic, creatinine level stable, continue daily Lasix.  CKD stage I with kidney transplant -Creatinine level stable, -Continue Imuran and prednisone  COPD -No acute exacerbation -As needed albuterol -Continue Brovana and Pulmicort  IDDM -Hold off metformin in case patient will need IV contrast -Continue long-acting insulin of 30 units daily -Continue home sliding scale regimen  Chronic back pain and narcotic dependence -Continue as needed oxycodone -Continue Flexeril  PAF -In sinus tachycardia, continue metoprolol, add as needed Lopressor for heart rate> 110 -Continue Eliquis  DVT prophylaxis: Eliquis Code Status: DNR Family Communication: None at bedside Disposition Plan: Expect less than 2 midnight hospital stay Consults called: Cardiology Admission status: Tele obs   Lequita Halt MD [facility] Pager [phone]  11/06/2022, 12:42 PM

## 2022-11-07 ENCOUNTER — Inpatient Hospital Stay (HOSPITAL_COMMUNITY): Payer: Medicare Other

## 2022-11-07 DIAGNOSIS — R0789 Other chest pain: Secondary | ICD-10-CM | POA: Diagnosis not present

## 2022-11-07 DIAGNOSIS — E785 Hyperlipidemia, unspecified: Secondary | ICD-10-CM | POA: Diagnosis not present

## 2022-11-07 DIAGNOSIS — J449 Chronic obstructive pulmonary disease, unspecified: Secondary | ICD-10-CM | POA: Diagnosis present

## 2022-11-07 DIAGNOSIS — N182 Chronic kidney disease, stage 2 (mild): Secondary | ICD-10-CM | POA: Diagnosis not present

## 2022-11-07 DIAGNOSIS — I5042 Chronic combined systolic (congestive) and diastolic (congestive) heart failure: Secondary | ICD-10-CM | POA: Diagnosis present

## 2022-11-07 DIAGNOSIS — I7092 Chronic total occlusion of artery of the extremities: Secondary | ICD-10-CM | POA: Diagnosis present

## 2022-11-07 DIAGNOSIS — E1122 Type 2 diabetes mellitus with diabetic chronic kidney disease: Secondary | ICD-10-CM | POA: Diagnosis present

## 2022-11-07 DIAGNOSIS — E1151 Type 2 diabetes mellitus with diabetic peripheral angiopathy without gangrene: Secondary | ICD-10-CM | POA: Diagnosis present

## 2022-11-07 DIAGNOSIS — R0603 Acute respiratory distress: Secondary | ICD-10-CM | POA: Diagnosis not present

## 2022-11-07 DIAGNOSIS — I25708 Atherosclerosis of coronary artery bypass graft(s), unspecified, with other forms of angina pectoris: Secondary | ICD-10-CM | POA: Diagnosis not present

## 2022-11-07 DIAGNOSIS — J9621 Acute and chronic respiratory failure with hypoxia: Secondary | ICD-10-CM | POA: Diagnosis not present

## 2022-11-07 DIAGNOSIS — J9611 Chronic respiratory failure with hypoxia: Secondary | ICD-10-CM | POA: Diagnosis present

## 2022-11-07 DIAGNOSIS — I2510 Atherosclerotic heart disease of native coronary artery without angina pectoris: Secondary | ICD-10-CM | POA: Diagnosis not present

## 2022-11-07 DIAGNOSIS — R079 Chest pain, unspecified: Secondary | ICD-10-CM | POA: Diagnosis not present

## 2022-11-07 DIAGNOSIS — R570 Cardiogenic shock: Secondary | ICD-10-CM | POA: Diagnosis not present

## 2022-11-07 DIAGNOSIS — N189 Chronic kidney disease, unspecified: Secondary | ICD-10-CM | POA: Diagnosis not present

## 2022-11-07 DIAGNOSIS — I25709 Atherosclerosis of coronary artery bypass graft(s), unspecified, with unspecified angina pectoris: Secondary | ICD-10-CM | POA: Diagnosis not present

## 2022-11-07 DIAGNOSIS — N179 Acute kidney failure, unspecified: Secondary | ICD-10-CM | POA: Diagnosis not present

## 2022-11-07 DIAGNOSIS — R0609 Other forms of dyspnea: Secondary | ICD-10-CM | POA: Diagnosis not present

## 2022-11-07 DIAGNOSIS — Z940 Kidney transplant status: Secondary | ICD-10-CM | POA: Diagnosis not present

## 2022-11-07 DIAGNOSIS — I4819 Other persistent atrial fibrillation: Secondary | ICD-10-CM | POA: Diagnosis not present

## 2022-11-07 DIAGNOSIS — E873 Alkalosis: Secondary | ICD-10-CM | POA: Diagnosis present

## 2022-11-07 DIAGNOSIS — Z66 Do not resuscitate: Secondary | ICD-10-CM | POA: Diagnosis present

## 2022-11-07 DIAGNOSIS — F1120 Opioid dependence, uncomplicated: Secondary | ICD-10-CM | POA: Diagnosis present

## 2022-11-07 DIAGNOSIS — J189 Pneumonia, unspecified organism: Secondary | ICD-10-CM | POA: Diagnosis not present

## 2022-11-07 DIAGNOSIS — I959 Hypotension, unspecified: Secondary | ICD-10-CM | POA: Diagnosis not present

## 2022-11-07 DIAGNOSIS — Z7189 Other specified counseling: Secondary | ICD-10-CM | POA: Diagnosis not present

## 2022-11-07 DIAGNOSIS — E1169 Type 2 diabetes mellitus with other specified complication: Secondary | ICD-10-CM | POA: Diagnosis not present

## 2022-11-07 DIAGNOSIS — I5043 Acute on chronic combined systolic (congestive) and diastolic (congestive) heart failure: Secondary | ICD-10-CM | POA: Diagnosis not present

## 2022-11-07 DIAGNOSIS — D631 Anemia in chronic kidney disease: Secondary | ICD-10-CM | POA: Diagnosis present

## 2022-11-07 DIAGNOSIS — I214 Non-ST elevation (NSTEMI) myocardial infarction: Secondary | ICD-10-CM | POA: Diagnosis present

## 2022-11-07 DIAGNOSIS — I209 Angina pectoris, unspecified: Secondary | ICD-10-CM | POA: Diagnosis not present

## 2022-11-07 DIAGNOSIS — J438 Other emphysema: Secondary | ICD-10-CM | POA: Diagnosis not present

## 2022-11-07 DIAGNOSIS — E669 Obesity, unspecified: Secondary | ICD-10-CM | POA: Diagnosis present

## 2022-11-07 DIAGNOSIS — J810 Acute pulmonary edema: Secondary | ICD-10-CM | POA: Diagnosis not present

## 2022-11-07 DIAGNOSIS — J9601 Acute respiratory failure with hypoxia: Secondary | ICD-10-CM | POA: Diagnosis not present

## 2022-11-07 DIAGNOSIS — Z8616 Personal history of COVID-19: Secondary | ICD-10-CM | POA: Diagnosis not present

## 2022-11-07 DIAGNOSIS — M069 Rheumatoid arthritis, unspecified: Secondary | ICD-10-CM | POA: Diagnosis present

## 2022-11-07 DIAGNOSIS — N390 Urinary tract infection, site not specified: Secondary | ICD-10-CM | POA: Diagnosis present

## 2022-11-07 DIAGNOSIS — N1831 Chronic kidney disease, stage 3a: Secondary | ICD-10-CM | POA: Diagnosis present

## 2022-11-07 DIAGNOSIS — I350 Nonrheumatic aortic (valve) stenosis: Secondary | ICD-10-CM | POA: Diagnosis present

## 2022-11-07 DIAGNOSIS — R579 Shock, unspecified: Secondary | ICD-10-CM | POA: Diagnosis not present

## 2022-11-07 DIAGNOSIS — I130 Hypertensive heart and chronic kidney disease with heart failure and stage 1 through stage 4 chronic kidney disease, or unspecified chronic kidney disease: Secondary | ICD-10-CM | POA: Diagnosis present

## 2022-11-07 DIAGNOSIS — Z9861 Coronary angioplasty status: Secondary | ICD-10-CM | POA: Diagnosis not present

## 2022-11-07 DIAGNOSIS — I69398 Other sequelae of cerebral infarction: Secondary | ICD-10-CM | POA: Diagnosis not present

## 2022-11-07 DIAGNOSIS — I25110 Atherosclerotic heart disease of native coronary artery with unstable angina pectoris: Secondary | ICD-10-CM | POA: Diagnosis present

## 2022-11-07 DIAGNOSIS — Z515 Encounter for palliative care: Secondary | ICD-10-CM | POA: Diagnosis not present

## 2022-11-07 DIAGNOSIS — I10 Essential (primary) hypertension: Secondary | ICD-10-CM | POA: Diagnosis not present

## 2022-11-07 DIAGNOSIS — L8990 Pressure ulcer of unspecified site, unspecified stage: Secondary | ICD-10-CM | POA: Insufficient documentation

## 2022-11-07 DIAGNOSIS — I25700 Atherosclerosis of coronary artery bypass graft(s), unspecified, with unstable angina pectoris: Secondary | ICD-10-CM | POA: Diagnosis present

## 2022-11-07 DIAGNOSIS — I480 Paroxysmal atrial fibrillation: Secondary | ICD-10-CM | POA: Diagnosis not present

## 2022-11-07 DIAGNOSIS — I4891 Unspecified atrial fibrillation: Secondary | ICD-10-CM | POA: Diagnosis not present

## 2022-11-07 DIAGNOSIS — C44609 Unspecified malignant neoplasm of skin of left upper limb, including shoulder: Secondary | ICD-10-CM | POA: Diagnosis present

## 2022-11-07 DIAGNOSIS — F32A Depression, unspecified: Secondary | ICD-10-CM | POA: Diagnosis present

## 2022-11-07 LAB — CBC
HCT: 28.6 % — ABNORMAL LOW (ref 36.0–46.0)
HCT: 29.7 % — ABNORMAL LOW (ref 36.0–46.0)
Hemoglobin: 9.6 g/dL — ABNORMAL LOW (ref 12.0–15.0)
Hemoglobin: 9.7 g/dL — ABNORMAL LOW (ref 12.0–15.0)
MCH: 30.9 pg (ref 26.0–34.0)
MCH: 31.5 pg (ref 26.0–34.0)
MCHC: 32.7 g/dL (ref 30.0–36.0)
MCHC: 33.6 g/dL (ref 30.0–36.0)
MCV: 93.8 fL (ref 80.0–100.0)
MCV: 94.6 fL (ref 80.0–100.0)
Platelets: 227 10*3/uL (ref 150–400)
Platelets: 231 10*3/uL (ref 150–400)
RBC: 3.05 MIL/uL — ABNORMAL LOW (ref 3.87–5.11)
RBC: 3.14 MIL/uL — ABNORMAL LOW (ref 3.87–5.11)
RDW: 17.3 % — ABNORMAL HIGH (ref 11.5–15.5)
RDW: 17.3 % — ABNORMAL HIGH (ref 11.5–15.5)
WBC: 5.7 10*3/uL (ref 4.0–10.5)
WBC: 7.7 10*3/uL (ref 4.0–10.5)
nRBC: 0.7 % — ABNORMAL HIGH (ref 0.0–0.2)
nRBC: 0.8 % — ABNORMAL HIGH (ref 0.0–0.2)

## 2022-11-07 LAB — HEPARIN LEVEL (UNFRACTIONATED): Heparin Unfractionated: >1.1 IU/mL — ABNORMAL HIGH (ref 0.30–0.70)

## 2022-11-07 LAB — GLUCOSE, CAPILLARY
Glucose-Capillary: 198 mg/dL — ABNORMAL HIGH (ref 70–99)
Glucose-Capillary: 322 mg/dL — ABNORMAL HIGH (ref 70–99)
Glucose-Capillary: 114 mg/dL — ABNORMAL HIGH (ref 70–99)
Glucose-Capillary: 314 mg/dL — ABNORMAL HIGH (ref 70–99)

## 2022-11-07 LAB — BASIC METABOLIC PANEL
Anion gap: 8 (ref 5–15)
BUN: 13 mg/dL (ref 8–23)
CO2: 29 mmol/L (ref 22–32)
Calcium: 8.3 mg/dL — ABNORMAL LOW (ref 8.9–10.3)
Chloride: 96 mmol/L — ABNORMAL LOW (ref 98–111)
Creatinine, Ser: 1.04 mg/dL — ABNORMAL HIGH (ref 0.44–1.00)
GFR, Estimated: 60 mL/min — ABNORMAL LOW (ref >60)
Glucose, Bld: 188 mg/dL — ABNORMAL HIGH (ref 70–99)
Potassium: 3.9 mmol/L (ref 3.5–5.1)
Sodium: 133 mmol/L — ABNORMAL LOW (ref 135–145)

## 2022-11-07 LAB — APTT: aPTT: 45 seconds — ABNORMAL HIGH (ref 24–36)

## 2022-11-07 MED ORDER — FUROSEMIDE 10 MG/ML IJ SOLN
40.0000 mg | Freq: Once | INTRAMUSCULAR | Status: AC
  Administered 2022-11-07: 40 mg via INTRAVENOUS
  Filled 2022-11-07: qty 4

## 2022-11-07 MED ORDER — APIXABAN 5 MG PO TABS
5.0000 mg | ORAL_TABLET | Freq: Two times a day (BID) | ORAL | Status: DC
  Administered 2022-11-07 – 2022-11-11 (×9): 5 mg via ORAL
  Filled 2022-11-07 (×9): qty 1

## 2022-11-07 NOTE — Progress Notes (Addendum)
[facility] night cross cover note:   I was notified by RN that this patient who is admitted with atypical chest pain and has advanced COPD, who has been on 70 L nasal cannula throughout the day, has experienced desaturation on 7 L nasal cannula, with O2 sats into the mid 80s.  Has some wheezing, improving with nebulizer treatment.  Will check updated chest x-ray at this time.   Update: Updated chest x-ray reported to reflect interval increase in interstitial edema.  I subsequently ordered Lasix 40 mg IV x 1 dose now, which is relative to her existing diuretic regimen that consists of Lasix 40 mg p.o. daily.  I also added on a procalcitonin level and bnp to further assess.      Babs Bertin, DO Hospitalist

## 2022-11-07 NOTE — Progress Notes (Addendum)
Pt noted to have audible wheezing with improvement after nebulizer and scattered crackles.  Pt in no acute distress.  Sats intermittently decreasing to lower 80s on her 7L (baseline need at home).  Pt agreeable to trying our cpap tonight.  MD notified with orders for [facility].  Results = Increasing interstitial edema sent to MD.

## 2022-11-07 NOTE — Progress Notes (Signed)
Called to bedside for an episode of chest pain relieved with nitro. She states this is her usual CP she gets nearly daily relieved with nitro. I discussed potentially increasing her imdur to avoid taking PRN nitro, she is amenable. Depending on BP, could consider increasing imdur.

## 2022-11-07 NOTE — Progress Notes (Addendum)
PROGRESS NOTE    Katherine [name]  [id number] DOB: Oct 08, 1955 DOA: 11/06/2022 PCP: [name], MD     Brief Narrative:  Katherine [name] is a 67 y.o. female with medical history significant of chronic hypoxic respiratory failure on 7 L continuously, COPD Gold stage I, CAD status post CABG and engraft stenting, PAD, OSA, HLD, chronic combined HFrEF and HFpEF, PAF on Eliquis, ESRD status post renal transplant, anxiety/depression, CVA, presented with chest pain.   This morning, patient woke up with severe pressure-like chest pain, "allover my chest" associated with increasing shortness of breath and palpitations.  No cough no abdominal pain no nausea vomiting or diarrhea.  Patient also complaining about foul smell urine for few days.  Last month, patient was treated for URI with antibiotics.  Recently, patient has been under a lot of stress in her life, she has been on benzos but still feels very "stressed out".  En route to ED, code heart was called by EMS, and patient was given nitroglycerin en route and chest pain subsided.   In the ED comparison of EKG, patient has a chronic LBBB, troponin first at 27.  Code heart was called off by cardiology.  Patient was started on heparin drip  New events last 24 hours / Subjective: Patient in tears during examination.  She states that her brother who she lives with recently passed away after ICU stay.  She states that her brother was the one taking care of her.  She reports that she now has no one who can take care of her.  Has had 7 siblings, currently only 2 others are still alive.  She states that she does not want to go to skilled nursing facility, does not want to lose her dog.  However, she is very limited in her mobility at baseline and has had a lot of help from her brother.   Assessment & Plan:   Principal Problem:   Chest pain Active Problems:   Renal transplant disorder   CAD (coronary artery disease) of bypass graft   Essential  hypertension   Paroxysmal atrial fibrillation (HCC); CHA2DS2VASc score F, HTN, CAD, CVA = 5   Type 2 diabetes mellitus with hyperlipidemia (HCC)   LBBB (left bundle branch block)   OSA (obstructive sleep apnea)   Current chronic use of systemic steroids   Chronic respiratory failure with hypoxia (HCC)   COPD (chronic obstructive pulmonary disease) (HCC)   History of renal transplant   Obesity (BMI 30-39.9)   Chronic combined systolic and diastolic heart failure (HCC)   CHF exacerbation (HCC)   Insulin-requiring or dependent type II diabetes mellitus (HCC)   Pressure injury of skin   Atypical chest pain CAD -Has had extensive CAD history status post CABG with multiple stenting -Continue Plavix, ranolazine, Imdur, Crestor, metoprolol  -Cardiology following -Echocardiogram pending  Paroxysmal A-fib -Metoprolol, Eliquis  Chronic combined systolic diastolic heart failure -Lasix, metoprolol  Chronic hypoxemic respiratory failure -States that she has been on 7 L oxygen for the past year after her COVID diagnosis  Left arm lesion -Has been ongoing over the past several months, has outpatient surgery referral for removal  Debility -PT OT, suspect she will need skilled nursing facility placement  Depression with emotional stress  -Poorly controlled, has recently lost her brother who she was very close to -Declined chaplain support, available if needed  -Vraylar, Lamictal, Ativan, Zoloft  CKD, status post kidney transplant -Imuran, prednisone  COPD -Breathing treatments and inhalers  Insulin-dependent  diabetes -Semglee, sliding scale insulin  Chronic back pain, narcotic dependent -Oxycodone, Flexeril    In agreement with assessment of the pressure ulcer as below: Pressure Injury 11/06/22 Sacrum Mid;Lower Stage 2 -  Partial thickness loss of dermis presenting as a shallow open injury with a red, pink wound bed without slough. (Active)  11/06/22 2000  Location: Sacrum   Location Orientation: Mid;Lower  Staging: Stage 2 -  Partial thickness loss of dermis presenting as a shallow open injury with a red, pink wound bed without slough.  Wound Description (Comments):   Present on Admission: Yes  Dressing Type Foam - Lift dressing to assess site every shift 11/06/22 1937     DVT prophylaxis:  apixaban (ELIQUIS) tablet 5 mg  Code Status: DNR Family Communication: Niece over the phone Disposition Plan:  Status is: Inpatient Remains inpatient appropriate because: She needs PT OT and safe home disposition   Antimicrobials:  Anti-infectives (From admission, onward)    Start     Dose/Rate Route Frequency Ordered Stop   11/06/22 1300  cefTRIAXone (ROCEPHIN) 2 g in sodium chloride 0.9 % 100 mL IVPB  Status:  Discontinued        2 g 200 mL/hr over 30 Minutes Intravenous Every 24 hours 11/06/22 1256 11/06/22 1729        Objective: Vitals:   11/07/22 0553 11/07/22 0727 11/07/22 0822 11/07/22 1346  BP: 112/76  108/75 117/85  Pulse: 79 79 92 93  Resp: 18 18  18   Temp: 98.1 F (36.7 C)   (!) 97.5 F (36.4 C)  TempSrc: Oral   Oral  SpO2: 94% 95%  93%  Weight:        Intake/Output Summary (Last 24 hours) at 11/07/2022 1352 Last data filed at 11/07/2022 0500 Gross per 24 hour  Intake 96.34 ml  Output 500 ml  Net -403.66 ml   Filed Weights   11/06/22 1500  Weight: 80.4 kg    Examination:  General exam: Appears calm and comfortable  Respiratory system: Clear to auscultation. Respiratory effort normal. No respiratory distress. No conversational dyspnea.  Cardiovascular system: S1 & S2 heard, tachycardic. No murmurs. No pedal edema. Gastrointestinal system: Abdomen is nondistended, soft and nontender. Normal bowel sounds heard. Central nervous system: Alert and oriented. No focal neurological deficits. Speech clear.  Extremities: Symmetric in appearance  Skin:  Psychiatry: Judgement and insight appear normal. Mood & affect appropriate.   Data  Reviewed: I have personally reviewed following labs and imaging studies  CBC: Recent Labs  Lab 11/06/22 0821 11/06/22 0822 11/07/22 0125  WBC 5.7  --  5.7  NEUTROABS 4.0  --   --   HGB 11.6* 10.9* 9.7*  HCT 34.7* 32.0* 29.7*  MCV 95.1  --  94.6  PLT 262  --  [id number]   Basic Metabolic Panel: Recent Labs  Lab 11/06/22 0822 11/06/22 0920 11/06/22 1539 11/07/22 0125  NA 134* 134*  --  133*  K 5.2* 3.3* 2.9* 3.9  CL  --  97*  --  96*  CO2  --  26  --  29  GLUCOSE  --  174*  --  188*  BUN  --  12  --  13  CREATININE  --  1.10*  --  1.04*  CALCIUM  --  8.2*  --  8.3*   GFR: Estimated Creatinine Clearance: 51.8 mL/min (A) (by C-G formula based on SCr of 1.04 mg/dL (H)). Liver Function Tests: Recent Labs  Lab 11/06/22 0920  AST  17  ALT 7  ALKPHOS 82  BILITOT 0.7  PROT 6.4*  ALBUMIN 2.7*   No results for input(s): "LIPASE", "AMYLASE" in the last 168 hours. No results for input(s): "AMMONIA" in the last 168 hours. Coagulation Profile: Recent Labs  Lab 11/06/22 0825  INR 1.6*   Cardiac Enzymes: No results for input(s): "CKTOTAL", "CKMB", "CKMBINDEX", "TROPONINI" in the last 168 hours. BNP (last 3 results) No results for input(s): "PROBNP" in the last 8760 hours. HbA1C: No results for input(s): "HGBA1C" in the last 72 hours. CBG: Recent Labs  Lab 11/06/22 1510 11/06/22 2149 11/07/22 0738 11/07/22 1146  GLUCAP 154* 317* 198* 322*   Lipid Profile: No results for input(s): "CHOL", "HDL", "LDLCALC", "TRIG", "CHOLHDL", "LDLDIRECT" in the last 72 hours. Thyroid Function Tests: Recent Labs    11/06/22 1539  TSH 1.609   Anemia Panel: No results for input(s): "VITAMINB12", "FOLATE", "FERRITIN", "TIBC", "IRON", "RETICCTPCT" in the last 72 hours. Sepsis Labs: No results for input(s): "PROCALCITON", "LATICACIDVEN" in the last 168 hours.  No results found for this or any previous visit (from the past 240 hour(s)).    Radiology Studies: DG Chest Port 1  View  Result Date: 11/06/2022 CLINICAL DATA:  Chest pain and dyspnea EXAM: PORTABLE CHEST 1 VIEW COMPARISON:  02/02/2022 chest radiograph. FINDINGS: Stable mid to lower sternotomy wire discontinuities. CABG clips overlie the mediastinum. Stable cardiomediastinal silhouette with top-normal heart size. No pneumothorax. No pleural effusion. No acute consolidative airspace disease. No overt pulmonary edema. Chronic coarsened interstitial and patchy reticular opacities throughout both lungs. IMPRESSION: No acute cardiopulmonary disease. Chronic coarsened interstitial and patchy reticular opacities throughout both lungs, favor nonspecific scarring versus chronic interstitial lung disease. Electronically Signed   By: Ilona Sorrel M.D.   On: 11/06/2022 08:49      Scheduled Meds:  apixaban  5 mg Oral BID   arformoterol  15 mcg Nebulization BID   azaTHIOprine  125 mg Oral Daily   budesonide  2 mL Nebulization BID   cariprazine  1.5 mg Oral Daily   clopidogrel  75 mg Oral Daily   fluticasone  1 spray Each Nare Daily   furosemide  40 mg Oral Daily   gabapentin  100 mg Oral QHS   insulin aspart  0-15 Units Subcutaneous TID WC   insulin aspart  0-5 Units Subcutaneous QHS   insulin glargine-yfgn  30 Units Subcutaneous Daily   isosorbide mononitrate  60 mg Oral Daily   lamoTRIgine  25 mg Oral QHS   LORazepam  1 mg Intravenous Once   metoprolol tartrate  25 mg Oral TID   montelukast  10 mg Oral QHS   pantoprazole  40 mg Oral Daily   potassium chloride  10 mEq Oral BID   predniSONE  5 mg Oral Q breakfast   Ensure Max Protein  11 oz Oral BID   ranolazine  1,000 mg Oral BID   rosuvastatin  20 mg Oral QHS   sertraline  200 mg Oral Daily   Continuous Infusions:   LOS: 0 days   Time spent: 35 minutes   Dessa Phi, DO [facility] 11/07/2022, 1:52 PM   Available via Epic secure chat 7am-7pm After these hours, please refer to coverage provider listed on [email or web address]

## 2022-11-07 NOTE — Progress Notes (Signed)
Patient had an episode of 7/10 chest tightness that was relieved by one sublingual nitroglycerin. EKG was obtained.

## 2022-11-07 NOTE — Progress Notes (Signed)
Cardiologist:  Ellyn Hack   Subjective:  Denies SSCP, palpitations or Dyspnea Discussed skin cancer on her left arm  Objective:  Vitals:   11/06/22 1924 11/06/22 1926 11/06/22 1937 11/07/22 0553  BP:   108/67 112/76  Pulse: 87  67 79  Resp: 16   18  Temp:   98.4 F (36.9 C) 98.1 F (36.7 C)  TempSrc:   Oral Oral  SpO2: 95% 98% 94% 94%  Weight:        Intake/Output from previous day:  Intake/Output Summary (Last 24 hours) at 11/07/2022 0824 Last data filed at 11/07/2022 0500 Gross per 24 hour  Intake 96.34 ml  Output 500 ml  Net -403.66 ml    Physical Exam: Overweight chronically ill Post sternotomy with SEM  Abdomen post renal transplant  Lungs clear Trace edema Palpable pedal pulses   Lab Results: Basic Metabolic Panel: Recent Labs    11/06/22 0920 11/06/22 1539 11/07/22 0125  NA 134*  --  133*  K 3.3* 2.9* 3.9  CL 97*  --  96*  CO2 26  --  29  GLUCOSE 174*  --  188*  BUN 12  --  13  CREATININE 1.10*  --  1.04*  CALCIUM 8.2*  --  8.3*   Liver Function Tests: Recent Labs    11/06/22 0920  AST 17  ALT 7  ALKPHOS 82  BILITOT 0.7  PROT 6.4*  ALBUMIN 2.7*   No results for input(s): "LIPASE", "AMYLASE" in the last 72 hours. CBC: Recent Labs    11/06/22 0821 11/06/22 0822 11/07/22 0125  WBC 5.7  --  5.7  NEUTROABS 4.0  --   --   HGB 11.6* 10.9* 9.7*  HCT 34.7* 32.0* 29.7*  MCV 95.1  --  94.6  PLT 262  --  231    Thyroid Function Tests: Recent Labs    11/06/22 1539  TSH 1.609   Anemia Panel: No results for input(s): "VITAMINB12", "FOLATE", "FERRITIN", "TIBC", "IRON", "RETICCTPCT" in the last 72 hours.  Imaging: DG Chest Port 1 View  Result Date: 11/06/2022 CLINICAL DATA:  Chest pain and dyspnea EXAM: PORTABLE CHEST 1 VIEW COMPARISON:  02/02/2022 chest radiograph. FINDINGS: Stable mid to lower sternotomy wire discontinuities. CABG clips overlie the mediastinum. Stable cardiomediastinal silhouette with top-normal heart size. No  pneumothorax. No pleural effusion. No acute consolidative airspace disease. No overt pulmonary edema. Chronic coarsened interstitial and patchy reticular opacities throughout both lungs. IMPRESSION: No acute cardiopulmonary disease. Chronic coarsened interstitial and patchy reticular opacities throughout both lungs, favor nonspecific scarring versus chronic interstitial lung disease. Electronically Signed   By: Ilona Sorrel M.D.   On: 11/06/2022 08:49    Cardiac Studies:  ECG:  SR LBBB    Telemetry: SR and periods of PAF   Echo:   Medications:    arformoterol  15 mcg Nebulization BID   azaTHIOprine  125 mg Oral Daily   budesonide  2 mL Nebulization BID   cariprazine  1.5 mg Oral Daily   clopidogrel  75 mg Oral Daily   fluticasone  1 spray Each Nare Daily   furosemide  40 mg Oral Daily   gabapentin  100 mg Oral QHS   insulin aspart  0-15 Units Subcutaneous TID WC   insulin aspart  0-5 Units Subcutaneous QHS   insulin glargine-yfgn  30 Units Subcutaneous Daily   isosorbide mononitrate  60 mg Oral Daily   lamoTRIgine  25 mg Oral QHS   LORazepam  1 mg Intravenous  Once   metoprolol tartrate  25 mg Oral TID   montelukast  10 mg Oral QHS   pantoprazole  40 mg Oral Daily   potassium chloride  10 mEq Oral BID   predniSONE  5 mg Oral Q breakfast   Ensure Max Protein  11 oz Oral BID   ranolazine  1,000 mg Oral BID   rosuvastatin  20 mg Oral QHS   sertraline  200 mg Oral Daily      heparin 900 Units/hr (11/06/22 2141)    Assessment/Plan:   CAD/CABG:  no plans for cath with prior renal transplant no chest pain ECG LBBB old Peak troponin peak 428 in setting of stress from brothers death She has had issues with access for cath which would be limited to brachial access PAF:  on eliquis rate control with lopressor DM:  per primary service  Jenkins Rouge 11/07/2022, 8:24 AM

## 2022-11-07 NOTE — Progress Notes (Signed)
IV lasix 40 mg given per orders.  Pt placed on CPAP per RT.  Oxygen sat 96%, purewick in place.

## 2022-11-07 NOTE — Evaluation (Signed)
Occupational Therapy Evaluation Patient Details Name: Katherine [name] MRN: [id number] DOB: [birth year] Today's Date: 11/07/2022   History of Present Illness Pt is a 67 y/o F presenting to ED on 3/22 with chest pain, resolved with nitroglycerin, and elevated troponin. PMH includes chronic hypoxic respiratry failure on 7L O2 continouosly, COPD Gold Stage I, CAD s/p CABG and endgraft stenting, PAD, OSA, HLD, chronic combined HFrEF and HFpEF, PAF on Eliquis, ESRD s/p renal transplant, anxiety/depression, CVA.   Clinical Impression   Pt reports needing assist at baseline for ADLs and has been pivoting to transport chair for mobility, pt reports her brother (caregiver) recently passed away, and she now lives alone with PRN family support. Pt needing min-mod A for ADLs, min guard for bed mobility, and min A for transfers with 1 person HHA. SpO2 down to 83% on 7L O2 with 1-2 mins to recover into the 90s. Pt presenting with impairments listed below, will follow acutely. Recommend postacute rehab at d/c, if unable will need max HH services.      Recommendations for follow up therapy are one component of a multi-disciplinary discharge planning process, led by the attending physician.  Recommendations may be updated based on patient status, additional functional criteria and insurance authorization.   Follow Up Recommendations  Skilled nursing-short term rehab (<3 hours/day) (pending progression, if pt unable, recommend max HH services)     Assistance Recommended at Discharge Intermittent Supervision/Assistance  Patient can return home with the following A little help with walking and/or transfers;A lot of help with bathing/dressing/bathroom;Assistance with cooking/housework;Direct supervision/assist for medications management;Direct supervision/assist for financial management;Help with stairs or ramp for entrance;Assist for transportation    Functional Status Assessment  Patient has had a recent  decline in their functional status and demonstrates the ability to make significant improvements in function in a reasonable and predictable amount of time.  Equipment Recommendations  None recommended by OT (pt has all needed DME)    Recommendations for Other Services PT consult     Precautions / Restrictions Precautions Precautions: Fall Precaution Comments: watch O2 Restrictions Weight Bearing Restrictions: No      Mobility Bed Mobility Overal bed mobility: Needs Assistance Bed Mobility: Supine to Sit, Sit to Supine     Supine to sit: Min guard Sit to supine: Min guard        Transfers Overall transfer level: Needs assistance Equipment used: 1 person hand held assist Transfers: Sit to/from Stand Sit to Stand: Min assist                  Balance Overall balance assessment: Needs assistance Sitting-balance support: Feet supported Sitting balance-Leahy Scale: Good Sitting balance - Comments: sits statically at EOB   Standing balance support: During functional activity Standing balance-Leahy Scale: Poor Standing balance comment: reliant on external support                           ADL either performed or assessed with clinical judgement   ADL Overall ADL's : Needs assistance/impaired Eating/Feeding: Set up   Grooming: Minimal assistance   Upper Body Bathing: Moderate assistance   Lower Body Bathing: Maximal assistance   Upper Body Dressing : Moderate assistance   Lower Body Dressing: Moderate assistance   Toilet Transfer: Ambulation;Regular Toilet;Minimal assistance   Toileting- Clothing Manipulation and Hygiene: Minimal assistance       Functional mobility during ADLs: Min guard       Vision Baseline Vision/History: 1 Wears  glasses Vision Assessment?: No apparent visual deficits     Perception Perception Perception Tested?: No   Praxis Praxis Praxis tested?: Not tested    Pertinent Vitals/Pain Pain Assessment Pain  Assessment: No/denies pain     Hand Dominance Right   Extremity/Trunk Assessment Upper Extremity Assessment Upper Extremity Assessment: Generalized weakness   Lower Extremity Assessment Lower Extremity Assessment: Defer to PT evaluation   Cervical / Trunk Assessment Cervical / Trunk Assessment: Normal   Communication Communication Communication: No difficulties   Cognition Arousal/Alertness: Awake/alert Behavior During Therapy: WFL for tasks assessed/performed Overall Cognitive Status: Within Functional Limits for tasks assessed                                       General Comments  SpO2 down to 83% on 7L O2    Exercises     Shoulder Instructions      Home Living Family/patient expects to be discharged to:: Private residence Living Arrangements: Alone Available Help at Discharge: Family;Friend(s);Available PRN/intermittently (brother lives across the street, also has a friend that helps make her meals) Type of Home: House Home Access: Stairs to enter [employer] of Steps: 5 Entrance Stairs-Rails: Left Home Layout: One level     Bathroom Shower/Tub: Teacher, early years/pre: Standard Bathroom Accessibility: Yes   Home Equipment: Rollator (4 wheels);Cane - single point;Tub bench;Grab bars - toilet;Wheelchair - manual;Hospital bed;BSC/3in1;Transport chair   Additional Comments: 7LO2 baseline      Prior Functioning/Environment Prior Level of Function : Needs assist             Mobility Comments: reports limited mobility (pivot trasnfers mostly) without AD ADLs Comments: was having assist from brother until he recently passed away, pt with difficutly clarifying current level of assist        OT Problem List: Decreased strength;Decreased range of motion;Decreased activity tolerance;Impaired balance (sitting and/or standing);Decreased safety awareness;Cardiopulmonary status limiting activity      OT  Treatment/Interventions: Self-care/ADL training;Therapeutic exercise;Energy conservation;DME and/or AE instruction;Therapeutic activities;Patient/family education;Balance training    OT Goals(Current goals can be found in the care plan section) Acute Rehab OT Goals Patient Stated Goal: none stated OT Goal Formulation: With patient Time For Goal Achievement: 11/21/22 Potential to Achieve Goals: Fair ADL Goals Pt Will Perform Upper Body Dressing: with supervision;sitting Pt Will Perform Lower Body Dressing: with supervision;sitting/lateral leans;sit to/from stand Pt Will Transfer to Toilet: with supervision;regular height toilet;ambulating;bedside commode Additional ADL Goal #1: pt will verbalize 3 energy conservation strategies in prep for ADLs  OT Frequency: Min 2X/week    Co-evaluation              AM-PAC OT "6 Clicks" Daily Activity     Outcome Measure Help from another person eating meals?: None Help from another person taking care of personal grooming?: A Little Help from another person toileting, which includes using toliet, bedpan, or urinal?: A Lot Help from another person bathing (including washing, rinsing, drying)?: A Lot Help from another person to put on and taking off regular upper body clothing?: A Little Help from another person to put on and taking off regular lower body clothing?: A Lot 6 Click Score: 16   End of Session Equipment Utilized During Treatment: Gait belt;Oxygen (7L) Nurse Communication: Mobility status  Activity Tolerance: Patient tolerated treatment well Patient left: in bed;with call bell/phone within reach;with bed alarm set  OT Visit Diagnosis: Unsteadiness on  feet (R26.81);Other abnormalities of gait and mobility (R26.89);Muscle weakness (generalized) (M62.81)                Time: [id number] OT Time Calculation (min): 26 min Charges:  OT General Charges $OT Visit: 1 Visit OT Evaluation $OT Eval Moderate Complexity: 1 Mod  Terrin Imparato K, OTD,  OTR/L SecureChat Preferred Acute Rehab [phone]  Renaye Rakers Koonce 11/07/2022, 3:17 PM

## 2022-11-07 NOTE — Progress Notes (Signed)
[facility] for heparin Indication: atrial fibrillation  Allergies  Allergen Reactions   Tetracycline Hives    Patient tolerated Doxycycline Dec 2020   Niacin Other (See Comments)    Mouth blisters   Niaspan [Niacin Er] Other (See Comments)    Mouth blisters   Sulfa Antibiotics Nausea Only and Other (See Comments)    "Tears up stomach"   Sulfonamide Derivatives Other (See Comments)    Reaction: per patient "tears her stomach up"   Codeine Nausea And Vomiting   Erythromycin Nausea And Vomiting   Hydromorphone Hcl Nausea And Vomiting   Morphine And Related Nausea And Vomiting   Nalbuphine Nausea And Vomiting    Nubain   Sulfasalazine Nausea Only and Other (See Comments)    per patient "tears her stomach up", "Tears up stomach"   Tape Rash and Other (See Comments)    No "plastic" tape," please----cloth tape only   Patient Measurements: Weight: 80.4 kg (177 lb 4.8 oz) Heparin Dosing Weight: 66.4 kg  Vital Signs: Temp: 98.1 F (36.7 C) (03/23 0553) Temp Source: Oral (03/23 0553) BP: 112/76 (03/23 0553) Pulse Rate: 79 (03/23 0553)  Labs: Recent Labs    11/06/22 0821 11/06/22 0822 11/06/22 0825 11/06/22 0920 11/06/22 1539 11/06/22 1803 11/06/22 1859 11/07/22 0121 11/07/22 0125  HGB 11.6* 10.9*  --   --   --   --   --   --  9.7*  HCT 34.7* 32.0*  --   --   --   --   --   --  29.7*  PLT 262  --   --   --   --   --   --   --  231  APTT  --   --   --   --   --  32  --   --  45*  LABPROT  --   --  19.2*  --   --   --   --   --   --   INR  --   --  1.6*  --   --   --   --   --   --   HEPARINUNFRC  --   --   --   --   --  >1.10*  --  >1.10*  --   CREATININE  --   --   --  1.10*  --   --   --   --  1.04*  TROPONINIHS  --   --   --  27* 257*  --  428*  --   --     Estimated Creatinine Clearance: 51.8 mL/min (A) (by C-G formula based on SCr of 1.04 mg/dL (H)).  Assessment: Katherine [name] is a 67 year old female admitted with chest discomfort.  Patient has a history of atrial fibrillation on Eliquis prior to admission. Patient reports last Eliquis dose 3/21 evening. With recent Eliquis use will not bolus heparin and will monitor aPTT until correlating with heparin level. Hgb 10.9, plt 262.  aPTT 45 seconds, heparin level >1.1 (due to recent eliquis use). Hgb 10.9>9.7 and plts 262>231. Per RN, lab was drawn appropriately, no issues with her line or pauses in her infusion, or signs/symptoms of bleeding  Goal of Therapy:  Heparin level 0.3-0.7 units/ml aPTT 66-102 seconds Monitor platelets by anticoagulation protocol: Yes   Plan:  Increase heparin to 1050 units / hr (~3 units/kg/hr) aPTT in 6 hours Daily aPTT, and heparin level until level  correlates Daily CBC Monitor for signs/symptoms of bleeding.   Thank you,  Sandford Craze, PharmD. [facility] Acute Care PGY-1  11/07/2022 7:28 AM

## 2022-11-07 NOTE — Progress Notes (Signed)
[facility] night cross cover note:   I was notified by RN of the patient's most recent troponin result: 428, relative to most recent prior value of 257, and 27 prior to that. Overall, this trend appears to reflect an increase in value, but at a decreasing rate, while noting no interval worsening of her persistent chest pain. Cardiology has already be formally consulted, and they are actively following. Will also obtain an updated EKG.     Babs Bertin, DO Hospitalist

## 2022-11-07 NOTE — Evaluation (Signed)
Physical Therapy Evaluation Patient Details Name: Katherine [name] MRN: [id number] DOB: 1955-12-01 Today's Date: 11/07/2022  History of Present Illness  Pt is a 67 y/o F presenting to ED on 3/22 with chest pain and elevated troponin, chest pain resolved with nitroglycerin. Cardiology following pt. PMH includes chronic hypoxic respiratry failure on 7L O2 continuosly, COPD Gold Stage I, CAD s/p CABG and endgraft stenting, PAD, OSA, HLD, chronic combined HFrEF and HFpEF, PAF on Eliquis, ESRD s/p renal transplant, anxiety/depression, CVA.  Clinical Impression  Pt admitted with above diagnosis. Pt was residing at home with her brother who was assisting her with ADLs and supervision with mobility.  Pt's brother recently passed away (unsure of date but sounds like within past week or so).  Pt now only has intermittent support.  Pt reports she is going home to her dog, she does not want to go to SNF.  Today, pt was able to transfer with min guard and min A but fatigues very easily.  Pt is likely not far from her baseline, but considering change in level of support at home pt may benefit from further therapy to advance prior to returning home.  Barrier to return home is that she requires assist for transfers, lives alone, steps to enter home, needs assist for ADLs and IADLs.  Pt would need assist throughout the day in order to return home from a PT perspective - could benefit from home health aides if that is an option for her.   Pt currently with functional limitations due to the deficits listed below (see PT Problem List). Pt will benefit from acute skilled PT to increase their independence and safety with mobility to allow discharge.          Recommendations for follow up therapy are one component of a multi-disciplinary discharge planning process, led by the attending physician.  Recommendations may be updated based on patient status, additional functional criteria and insurance authorization.  Follow Up  Recommendations Skilled nursing-short term rehab (<3 hours/day) (likely to decline SNF; recommend max HH services; if possible aides at home could help avoid SNF  Can patient physically be transported by private vehicle: Yes    Assistance Recommended at Discharge Intermittent Supervision/Assistance (recommend assist multiple times thorughout day)  Patient can return home with the following  A little help with walking and/or transfers;A little help with bathing/dressing/bathroom;Assistance with cooking/housework;Help with stairs or ramp for entrance    Equipment Recommendations None recommended by PT  Recommendations for Other Services       Functional Status Assessment Patient has had a recent decline in their functional status and demonstrates the ability to make significant improvements in function in a reasonable and predictable amount of time.     Precautions / Restrictions Precautions Precautions: Fall Precaution Comments: watch O2 Restrictions Weight Bearing Restrictions: No      Mobility  Bed Mobility Overal bed mobility: Needs Assistance Bed Mobility: Supine to Sit, Sit to Supine     Supine to sit: Min guard Sit to supine: Min guard        Transfers Overall transfer level: Needs assistance Equipment used: 1 person hand held assist Transfers: Sit to/from Stand Sit to Stand: Min assist           General transfer comment: Light min A to stand    Ambulation/Gait Ambulation/Gait assistance: Min guard Gait Distance (Feet): 20 Feet Assistive device: Rolling walker (2 wheels) Gait Pattern/deviations: Step-through pattern, Decreased stride length Gait velocity: decreased  General Gait Details: Ambulated short distance in room with min guard for safety; easily fatigued with O2 sats down to 83% on 7 L.  They did return to 93% in < 30 seconds  Stairs            Wheelchair Mobility    Modified Rankin (Stroke Patients Only)       Balance Overall  balance assessment: Needs assistance Sitting-balance support: Feet supported Sitting balance-Leahy Scale: Good     Standing balance support: Single extremity supported Standing balance-Leahy Scale: Poor Standing balance comment: Reliant on at least single UE support; hx of falls                             Pertinent Vitals/Pain Pain Assessment Pain Assessment: No/denies pain    Home Living Family/patient expects to be discharged to:: Private residence Living Arrangements: Alone Available Help at Discharge: Family;Friend(s);Available PRN/intermittently (brother lives across street; friends assist with meals) Type of Home: House Home Access: Stairs to enter Entrance Stairs-Rails: Left Entrance Stairs-Number of Steps: 5   Home Layout: One level Home Equipment: Rollator (4 wheels);Cane - single point;Tub bench;Grab bars - toilet;Wheelchair - manual;Hospital bed;BSC/3in1;Transport chair Additional Comments: 7LO2 baseline;  Pt with recent change in support system.  She was living with a brother but he recently passed (?last week or so).    Prior Function Prior Level of Function : Needs assist             Mobility Comments: Reports limited mobility.  States she mostly stands and pivots to chair without AD.  States did some short distance walks in house but usally had supervision from her brother that recently passed ADLs Comments: Pt was having assist from brother until he recently passed away (last week or so?), pt with difficutly clarifying current level of assist     Hand Dominance   Dominant Hand: Right    Extremity/Trunk Assessment   Upper Extremity Assessment Upper Extremity Assessment: Defer to OT evaluation    Lower Extremity Assessment Lower Extremity Assessment: Generalized weakness (easily fatigued, short of breath, frequent chest pain, did not MMT)    Cervical / Trunk Assessment Cervical / Trunk Assessment: Kyphotic  Communication    Communication: No difficulties  Cognition Arousal/Alertness: Awake/alert Behavior During Therapy: WFL for tasks assessed/performed Overall Cognitive Status: Within Functional Limits for tasks assessed                                          General Comments General comments (skin integrity, edema, etc.): SpO2 down to 83% on 7L O2    Exercises     Assessment/Plan    PT Assessment Patient needs continued PT services  PT Problem List Decreased strength;Decreased range of motion;Decreased activity tolerance;Decreased balance;Decreased mobility;Decreased knowledge of precautions;Decreased safety awareness;Cardiopulmonary status limiting activity;Decreased knowledge of use of DME       PT Treatment Interventions DME instruction;Gait training;Stair training;Therapeutic activities;Patient/family education;Functional mobility training;Balance training;Therapeutic exercise;Wheelchair mobility training    PT Goals (Current goals can be found in the Care Plan section)  Acute Rehab PT Goals Patient Stated Goal: return home to dog; does not want to go SNF PT Goal Formulation: With patient Time For Goal Achievement: 11/21/22 Potential to Achieve Goals: Fair    Frequency Min 3X/week     Co-evaluation  AM-PAC PT "6 Clicks" Mobility  Outcome Measure Help needed turning from your back to your side while in a flat bed without using bedrails?: None Help needed moving from lying on your back to sitting on the side of a flat bed without using bedrails?: A Little Help needed moving to and from a bed to a chair (including a wheelchair)?: A Little Help needed standing up from a chair using your arms (e.g., wheelchair or bedside chair)?: A Little Help needed to walk in hospital room?: A Little Help needed climbing 3-5 steps with a railing? : A Lot 6 Click Score: 18    End of Session Equipment Utilized During Treatment: Gait belt;Oxygen Activity Tolerance:  Patient tolerated treatment well Patient left: in bed;with call bell/phone within reach;with bed alarm set Nurse Communication: Mobility status PT Visit Diagnosis: Other abnormalities of gait and mobility (R26.89);Muscle weakness (generalized) (M62.81)    Time: [id number] PT Time Calculation (min) (ACUTE ONLY): 27 min   Charges:   PT Evaluation $PT Eval Low Complexity: 1 Low          Vernetta Dizdarevic, PT Acute Rehab [facility] [phone]   Katherine [name] 11/07/2022, 4:55 PM

## 2022-11-08 DIAGNOSIS — I209 Angina pectoris, unspecified: Secondary | ICD-10-CM | POA: Diagnosis not present

## 2022-11-08 DIAGNOSIS — R0789 Other chest pain: Secondary | ICD-10-CM | POA: Diagnosis not present

## 2022-11-08 LAB — BASIC METABOLIC PANEL
Anion gap: 8 (ref 5–15)
BUN: 15 mg/dL (ref 8–23)
CO2: 25 mmol/L (ref 22–32)
Calcium: 8.2 mg/dL — ABNORMAL LOW (ref 8.9–10.3)
Chloride: 97 mmol/L — ABNORMAL LOW (ref 98–111)
Creatinine, Ser: 1.1 mg/dL — ABNORMAL HIGH (ref 0.44–1.00)
GFR, Estimated: 56 mL/min — ABNORMAL LOW (ref >60)
Glucose, Bld: 239 mg/dL — ABNORMAL HIGH (ref 70–99)
Potassium: 3.9 mmol/L (ref 3.5–5.1)
Sodium: 130 mmol/L — ABNORMAL LOW (ref 135–145)

## 2022-11-08 LAB — PROCALCITONIN: Procalcitonin: <0.1 ng/mL

## 2022-11-08 LAB — BRAIN NATRIURETIC PEPTIDE: B Natriuretic Peptide: 785.2 pg/mL — ABNORMAL HIGH (ref 0.0–100.0)

## 2022-11-08 LAB — GLUCOSE, CAPILLARY
Glucose-Capillary: 165 mg/dL — ABNORMAL HIGH (ref 70–99)
Glucose-Capillary: 289 mg/dL — ABNORMAL HIGH (ref 70–99)
Glucose-Capillary: 369 mg/dL — ABNORMAL HIGH (ref 70–99)
Glucose-Capillary: 89 mg/dL (ref 70–99)

## 2022-11-08 NOTE — Progress Notes (Signed)
Cardiologist:  Ellyn Hack   Subjective:  Some chronic angina yesterday   Objective:  Vitals:   11/07/22 2034 11/07/22 2248 11/08/22 0059 11/08/22 0538  BP:   108/74 116/65  Pulse:   82 81  Resp:   20 20  Temp:    98.1 F (36.7 C)  TempSrc:    Oral  SpO2: 96% 98% 93% 94%  Weight:        Intake/Output from previous day:  Intake/Output Summary (Last 24 hours) at 11/08/2022 0806 Last data filed at 11/08/2022 0541 Gross per 24 hour  Intake 1420 ml  Output 900 ml  Net 520 ml    Physical Exam: Overweight chronically ill Post sternotomy with SEM  Abdomen post renal transplant  Lungs clear Trace edema Palpable pedal pulses  Left arm with large area of skin cancer   Lab Results: Basic Metabolic Panel: Recent Labs    11/07/22 0125 11/07/22 2332  NA 133* 130*  K 3.9 3.9  CL 96* 97*  CO2 29 25  GLUCOSE 188* 239*  BUN 13 15  CREATININE 1.04* 1.10*  CALCIUM 8.3* 8.2*   Liver Function Tests: Recent Labs    11/06/22 0920  AST 17  ALT 7  ALKPHOS 82  BILITOT 0.7  PROT 6.4*  ALBUMIN 2.7*   No results for input(s): "LIPASE", "AMYLASE" in the last 72 hours. CBC: Recent Labs    11/06/22 0821 11/06/22 0822 11/07/22 0125 11/07/22 2332  WBC 5.7  --  5.7 7.7  NEUTROABS 4.0  --   --   --   HGB 11.6*   < > 9.7* 9.6*  HCT 34.7*   < > 29.7* 28.6*  MCV 95.1  --  94.6 93.8  PLT 262  --  231 227   < > = values in this interval not displayed.    Thyroid Function Tests: Recent Labs    11/06/22 1539  TSH 1.609   Anemia Panel: No results for input(s): "VITAMINB12", "FOLATE", "FERRITIN", "TIBC", "IRON", "RETICCTPCT" in the last 72 hours.  Imaging: DG Chest Port 1 View  Result Date: 11/07/2022 CLINICAL DATA:  Hypoxia EXAM: PORTABLE CHEST 1 VIEW COMPARISON:  11/06/2022 FINDINGS: Cardiac shadow is enlarged but stable. Postsurgical changes are again seen. Lungs demonstrate increased interstitial markings consistent with edema. No sizable effusion is noted. No  pneumothorax is seen. No bony abnormality is noted. IMPRESSION: Increasing interstitial edema. Electronically Signed   By: Inez Catalina M.D.   On: 11/07/2022 22:16   DG Chest Port 1 View  Result Date: 11/06/2022 CLINICAL DATA:  Chest pain and dyspnea EXAM: PORTABLE CHEST 1 VIEW COMPARISON:  02/02/2022 chest radiograph. FINDINGS: Stable mid to lower sternotomy wire discontinuities. CABG clips overlie the mediastinum. Stable cardiomediastinal silhouette with top-normal heart size. No pneumothorax. No pleural effusion. No acute consolidative airspace disease. No overt pulmonary edema. Chronic coarsened interstitial and patchy reticular opacities throughout both lungs. IMPRESSION: No acute cardiopulmonary disease. Chronic coarsened interstitial and patchy reticular opacities throughout both lungs, favor nonspecific scarring versus chronic interstitial lung disease. Electronically Signed   By: Ilona Sorrel M.D.   On: 11/06/2022 08:49    Cardiac Studies:  ECG:  SR LBBB    Telemetry: SR and periods of PAF   Echo: EF 45-50% 12/08/21   Medications:    apixaban  5 mg Oral BID   arformoterol  15 mcg Nebulization BID   azaTHIOprine  125 mg Oral Daily   budesonide  2 mL Nebulization BID   cariprazine  1.5  mg Oral Daily   clopidogrel  75 mg Oral Daily   fluticasone  1 spray Each Nare Daily   furosemide  40 mg Oral Daily   gabapentin  100 mg Oral QHS   insulin aspart  0-15 Units Subcutaneous TID WC   insulin aspart  0-5 Units Subcutaneous QHS   insulin glargine-yfgn  30 Units Subcutaneous Daily   isosorbide mononitrate  60 mg Oral Daily   lamoTRIgine  25 mg Oral QHS   LORazepam  1 mg Intravenous Once   metoprolol tartrate  25 mg Oral TID   montelukast  10 mg Oral QHS   pantoprazole  40 mg Oral Daily   potassium chloride  10 mEq Oral BID   predniSONE  5 mg Oral Q breakfast   Ensure Max Protein  11 oz Oral BID   ranolazine  1,000 mg Oral BID   rosuvastatin  20 mg Oral QHS   sertraline  200 mg Oral  Daily        Assessment/Plan:   CAD/CABG:  no plans for cath with prior renal transplant no chest pain ECG LBBB old Peak troponin peak 428 in setting of stress from brothers death She has had issues with access for cath which would be limited to brachial access Imdur dose increased yeserday  PAF:  on eliquis rate control with lopressor DM:  per primary service  Jenkins Rouge 11/08/2022, 8:06 AM

## 2022-11-08 NOTE — Progress Notes (Signed)
PROGRESS NOTE    Katherine [name]  [id number] DOB: 12-06-1955 DOA: 11/06/2022 PCP: [name], MD     Brief Narrative:  Katherine [name] is a 67 y.o. female with medical history significant of chronic hypoxic respiratory failure on 7 L continuously, COPD Gold stage I, CAD status post CABG and engraft stenting, PAD, OSA, HLD, chronic combined HFrEF and HFpEF, PAF on Eliquis, ESRD status post renal transplant, anxiety/depression, CVA, presented with chest pain.   This morning, patient woke up with severe pressure-like chest pain, "allover my chest" associated with increasing shortness of breath and palpitations.  No cough no abdominal pain no nausea vomiting or diarrhea.  Patient also complaining about foul smell urine for few days.  Last month, patient was treated for URI with antibiotics.  Recently, patient has been under a lot of stress in her life, she has been on benzos but still feels very "stressed out".  En route to ED, code heart was called by EMS, and patient was given nitroglycerin en route and chest pain subsided.   In the ED comparison of EKG, patient has a chronic LBBB, troponin first at 27.  Code heart was called off by cardiology.  Patient was started on heparin drip  New events last 24 hours / Subjective: Discussed with her my recommendation for SNF, per PT OT evaluation yesterday.  Patient is declining SNF placement.  Wants to go home.  Discussed maximizing home health therapies.  TOC aware.  Patient did have episode of oxygen desaturation overnight with wheezing.  She was given IV Lasix.  Assessment & Plan:   Principal Problem:   Chest pain Active Problems:   Renal transplant disorder   CAD (coronary artery disease) of bypass graft   Essential hypertension   Paroxysmal atrial fibrillation (HCC); CHA2DS2VASc score F, HTN, CAD, CVA = 5   Type 2 diabetes mellitus with hyperlipidemia (HCC)   LBBB (left bundle branch block)   OSA (obstructive sleep apnea)    Current chronic use of systemic steroids   Chronic respiratory failure with hypoxia (HCC)   COPD (chronic obstructive pulmonary disease) (HCC)   History of renal transplant   Obesity (BMI 30-39.9)   Chronic combined systolic and diastolic heart failure (HCC)   CHF exacerbation (HCC)   Insulin-requiring or dependent type II diabetes mellitus (HCC)   Pressure injury of skin   Atypical chest pain CAD -Has had extensive CAD history status post CABG with multiple stenting -Continue Plavix, ranolazine, Imdur, Crestor, metoprolol  -Cardiology following -Echocardiogram pending  Paroxysmal A-fib -Metoprolol, Eliquis  Acute on chronic combined systolic diastolic heart failure -Lasix, metoprolol -CXR showed increasing interstitial edema  -Echocardiogram pending  Chronic hypoxemic respiratory failure -States that she has been on 7 L oxygen for the past year after her COVID diagnosis  Left arm lesion -Has been ongoing over the past several months, has outpatient surgery referral for removal  Debility -PT OT, recommending SNF placement, patient declining SNF.  Wants to go home.  TOC aware.  Depression with emotional stress  -Poorly controlled, has recently lost her brother who she was very close to -Declined chaplain support, available if needed  -Vraylar, Lamictal, Ativan, Zoloft  CKD stage IIIa, status post kidney transplant -Imuran, prednisone  COPD -Breathing treatments and inhalers  Insulin-dependent diabetes -Semglee, sliding scale insulin  Chronic back pain, narcotic dependent -Oxycodone, Flexeril    In agreement with assessment of the pressure ulcer as below: Pressure Injury 11/06/22 Sacrum Mid;Lower Stage 2 -  Partial thickness  loss of dermis presenting as a shallow open injury with a red, pink wound bed without slough. (Active)  11/06/22 2000  Location: Sacrum  Location Orientation: Mid;Lower  Staging: Stage 2 -  Partial thickness loss of dermis presenting as a  shallow open injury with a red, pink wound bed without slough.  Wound Description (Comments):   Present on Admission: Yes  Dressing Type Foam - Lift dressing to assess site every shift 11/06/22 1937     DVT prophylaxis:  apixaban (ELIQUIS) tablet 5 mg  Code Status: DNR Family Communication: None Disposition Plan:  Status is: Inpatient Remains inpatient appropriate because: Improved respiratory function, need home health services since patient is declining SNF placement against better judgment  Antimicrobials:  Anti-infectives (From admission, onward)    Start     Dose/Rate Route Frequency Ordered Stop   11/06/22 1300  cefTRIAXone (ROCEPHIN) 2 g in sodium chloride 0.9 % 100 mL IVPB  Status:  Discontinued        2 g 200 mL/hr over 30 Minutes Intravenous Every 24 hours 11/06/22 1256 11/06/22 1729        Objective: Vitals:   11/08/22 0538 11/08/22 0851 11/08/22 0853 11/08/22 1019  BP: 116/65   125/61  Pulse: 81   (!) 104  Resp: 20     Temp: 98.1 F (36.7 C)     TempSrc: Oral     SpO2: 94% 95% 93%   Weight:        Intake/Output Summary (Last 24 hours) at 11/08/2022 1220 Last data filed at 11/08/2022 1000 Gross per 24 hour  Intake 1660 ml  Output 900 ml  Net 760 ml    Filed Weights   11/06/22 1500  Weight: 80.4 kg    Examination:  General exam: Appears calm and comfortable  Respiratory system: Clear to auscultation. Respiratory effort normal. No respiratory distress. No conversational dyspnea.  Cardiovascular system: S1 & S2 heard, RRR. No murmurs. No pedal edema. Gastrointestinal system: Abdomen is nondistended, soft and nontender. Normal bowel sounds heard. Central nervous system: Alert and oriented. No focal neurological deficits. Speech clear.  Extremities: Symmetric in appearance  Psychiatry: Judgement and insight appear normal. Mood & affect appropriate.   Data Reviewed: I have personally reviewed following labs and imaging studies  CBC: Recent Labs   Lab 11/06/22 0821 11/06/22 0822 11/07/22 0125 11/07/22 2332  WBC 5.7  --  5.7 7.7  NEUTROABS 4.0  --   --   --   HGB 11.6* 10.9* 9.7* 9.6*  HCT 34.7* 32.0* 29.7* 28.6*  MCV 95.1  --  94.6 93.8  PLT 262  --  231 [id number]    Basic Metabolic Panel: Recent Labs  Lab 11/06/22 0822 11/06/22 0920 11/06/22 1539 11/07/22 0125 11/07/22 2332  NA 134* 134*  --  133* 130*  K 5.2* 3.3* 2.9* 3.9 3.9  CL  --  97*  --  96* 97*  CO2  --  26  --  29 25  GLUCOSE  --  174*  --  188* 239*  BUN  --  12  --  13 15  CREATININE  --  1.10*  --  1.04* 1.10*  CALCIUM  --  8.2*  --  8.3* 8.2*    GFR: Estimated Creatinine Clearance: 48.9 mL/min (A) (by C-G formula based on SCr of 1.1 mg/dL (H)). Liver Function Tests: Recent Labs  Lab 11/06/22 0920  AST 17  ALT 7  ALKPHOS 82  BILITOT 0.7  PROT 6.4*  ALBUMIN  2.7*    No results for input(s): "LIPASE", "AMYLASE" in the last 168 hours. No results for input(s): "AMMONIA" in the last 168 hours. Coagulation Profile: Recent Labs  Lab 11/06/22 0825  INR 1.6*    Cardiac Enzymes: No results for input(s): "CKTOTAL", "CKMB", "CKMBINDEX", "TROPONINI" in the last 168 hours. BNP (last 3 results) No results for input(s): "PROBNP" in the last 8760 hours. HbA1C: No results for input(s): "HGBA1C" in the last 72 hours. CBG: Recent Labs  Lab 11/07/22 0738 11/07/22 1146 11/07/22 1623 11/07/22 2111 11/08/22 0845  GLUCAP 198* 322* 114* 314* 165*    Lipid Profile: No results for input(s): "CHOL", "HDL", "LDLCALC", "TRIG", "CHOLHDL", "LDLDIRECT" in the last 72 hours. Thyroid Function Tests: Recent Labs    11/06/22 1539  TSH 1.609    Anemia Panel: No results for input(s): "VITAMINB12", "FOLATE", "FERRITIN", "TIBC", "IRON", "RETICCTPCT" in the last 72 hours. Sepsis Labs: Recent Labs  Lab 11/07/22 2332  PROCALCITON <0.10    No results found for this or any previous visit (from the past 240 hour(s)).    Radiology Studies: DG Chest Port 1  View  Result Date: 11/07/2022 CLINICAL DATA:  Hypoxia EXAM: PORTABLE CHEST 1 VIEW COMPARISON:  11/06/2022 FINDINGS: Cardiac shadow is enlarged but stable. Postsurgical changes are again seen. Lungs demonstrate increased interstitial markings consistent with edema. No sizable effusion is noted. No pneumothorax is seen. No bony abnormality is noted. IMPRESSION: Increasing interstitial edema. Electronically Signed   By: Inez Catalina M.D.   On: 11/07/2022 22:16      Scheduled Meds:  apixaban  5 mg Oral BID   arformoterol  15 mcg Nebulization BID   azaTHIOprine  125 mg Oral Daily   budesonide  2 mL Nebulization BID   cariprazine  1.5 mg Oral Daily   clopidogrel  75 mg Oral Daily   fluticasone  1 spray Each Nare Daily   furosemide  40 mg Oral Daily   gabapentin  100 mg Oral QHS   insulin aspart  0-15 Units Subcutaneous TID WC   insulin aspart  0-5 Units Subcutaneous QHS   insulin glargine-yfgn  30 Units Subcutaneous Daily   isosorbide mononitrate  60 mg Oral Daily   lamoTRIgine  25 mg Oral QHS   LORazepam  1 mg Intravenous Once   metoprolol tartrate  25 mg Oral TID   montelukast  10 mg Oral QHS   pantoprazole  40 mg Oral Daily   potassium chloride  10 mEq Oral BID   predniSONE  5 mg Oral Q breakfast   Ensure Max Protein  11 oz Oral BID   ranolazine  1,000 mg Oral BID   rosuvastatin  20 mg Oral QHS   sertraline  200 mg Oral Daily   Continuous Infusions:   LOS: 1 day   Time spent: 25 minutes   Dessa Phi, DO [facility] 11/08/2022, 12:20 PM   Available via Epic secure chat 7am-7pm After these hours, please refer to coverage provider listed on [email or web address]

## 2022-11-08 NOTE — Progress Notes (Signed)
Pt very tearful. Reflection on the death of her brother. He was her caregiver. She had to call EMS for home. He passed away on the Nov 02, 2022 and his funeral was the 3/22 and she was unable to attend because of being hospitalized. She is currently requesting ativan, we will give dose of ativan and oxycodone, continue with plan of care and provide support to the pt.

## 2022-11-09 ENCOUNTER — Inpatient Hospital Stay (HOSPITAL_COMMUNITY): Payer: Medicare Other

## 2022-11-09 DIAGNOSIS — I5043 Acute on chronic combined systolic (congestive) and diastolic (congestive) heart failure: Secondary | ICD-10-CM | POA: Diagnosis not present

## 2022-11-09 DIAGNOSIS — I480 Paroxysmal atrial fibrillation: Secondary | ICD-10-CM | POA: Diagnosis not present

## 2022-11-09 DIAGNOSIS — R079 Chest pain, unspecified: Secondary | ICD-10-CM | POA: Diagnosis not present

## 2022-11-09 DIAGNOSIS — R0789 Other chest pain: Secondary | ICD-10-CM | POA: Diagnosis not present

## 2022-11-09 LAB — BASIC METABOLIC PANEL
Anion gap: 9 (ref 5–15)
BUN: 14 mg/dL (ref 8–23)
CO2: 25 mmol/L (ref 22–32)
Calcium: 8.1 mg/dL — ABNORMAL LOW (ref 8.9–10.3)
Chloride: 96 mmol/L — ABNORMAL LOW (ref 98–111)
Creatinine, Ser: 0.93 mg/dL (ref 0.44–1.00)
GFR, Estimated: >60 mL/min (ref >60)
Glucose, Bld: 271 mg/dL — ABNORMAL HIGH (ref 70–99)
Potassium: 4.1 mmol/L (ref 3.5–5.1)
Sodium: 130 mmol/L — ABNORMAL LOW (ref 135–145)

## 2022-11-09 LAB — ECHOCARDIOGRAM COMPLETE
AR max vel: 1.9 cm2
AV Area VTI: 1.65 cm2
AV Area mean vel: 1.81 cm2
AV Mean grad: 9 mmHg
AV Peak grad: 18.1 mmHg
Ao pk vel: 2.13 m/s
MV VTI: 1.91 cm2
S' Lateral: 3.4 cm
Weight: 2836.8 oz

## 2022-11-09 LAB — CBC
HCT: 30.1 % — ABNORMAL LOW (ref 36.0–46.0)
Hemoglobin: 9.8 g/dL — ABNORMAL LOW (ref 12.0–15.0)
MCH: 31.3 pg (ref 26.0–34.0)
MCHC: 32.6 g/dL (ref 30.0–36.0)
MCV: 96.2 fL (ref 80.0–100.0)
Platelets: 198 10*3/uL (ref 150–400)
RBC: 3.13 MIL/uL — ABNORMAL LOW (ref 3.87–5.11)
RDW: 17.9 % — ABNORMAL HIGH (ref 11.5–15.5)
WBC: 8.6 10*3/uL (ref 4.0–10.5)
nRBC: 0.5 % — ABNORMAL HIGH (ref 0.0–0.2)

## 2022-11-09 LAB — GLUCOSE, CAPILLARY
Glucose-Capillary: 220 mg/dL — ABNORMAL HIGH (ref 70–99)
Glucose-Capillary: 213 mg/dL — ABNORMAL HIGH (ref 70–99)
Glucose-Capillary: 195 mg/dL — ABNORMAL HIGH (ref 70–99)
Glucose-Capillary: 195 mg/dL — ABNORMAL HIGH (ref 70–99)

## 2022-11-09 MED ORDER — INSULIN ASPART 100 UNIT/ML IJ SOLN
4.0000 [IU] | Freq: Three times a day (TID) | INTRAMUSCULAR | Status: DC
  Administered 2022-11-09 – 2022-11-11 (×5): 4 [IU] via SUBCUTANEOUS

## 2022-11-09 MED ORDER — FUROSEMIDE 10 MG/ML IJ SOLN
40.0000 mg | Freq: Once | INTRAMUSCULAR | Status: AC
  Administered 2022-11-09: 40 mg via INTRAVENOUS
  Filled 2022-11-09: qty 4

## 2022-11-09 MED ORDER — METOPROLOL TARTRATE 50 MG PO TABS
50.0000 mg | ORAL_TABLET | Freq: Two times a day (BID) | ORAL | Status: DC
  Administered 2022-11-09 – 2022-11-10 (×4): 50 mg via ORAL
  Filled 2022-11-09 (×4): qty 1

## 2022-11-09 MED ORDER — ISOSORBIDE MONONITRATE ER 60 MG PO TB24
90.0000 mg | ORAL_TABLET | Freq: Every day | ORAL | Status: DC
  Administered 2022-11-09 – 2022-11-10 (×2): 90 mg via ORAL
  Filled 2022-11-09 (×2): qty 1

## 2022-11-09 NOTE — Inpatient Diabetes Management (Signed)
Inpatient Diabetes Program Recommendations  AACE/ADA: New Consensus Statement on Inpatient Glycemic Control (2015)  Target Ranges:  Prepandial:   less than 140 mg/dL      Peak postprandial:   less than 180 mg/dL (1-2 hours)      Critically ill patients:  140 - 180 mg/dL   Lab Results  Component Value Date   GLUCAP 213 (H) 11/09/2022   HGBA1C 8.5 (H) 06/16/2022    Review of Glycemic Control  Latest Reference Range & Units 11/08/22 08:45 11/08/22 12:35 11/08/22 15:47 11/08/22 21:24 11/09/22 07:27 11/09/22 11:27  Glucose-Capillary 70 - 99 mg/dL 165 (H) 289 (H)  Novolog 8 units 369 (H)  Novolog 15 units 89 220 (H)  Novolog 5 units 213 (H)  Novolog 5 units   Diabetes history: DM 2 Outpatient Diabetes medications: Amaryl 4 mg Daily, Novolog 4-12 units tid, Basaglar 12 units Daily Current orders for Inpatient glycemic control:  Semglee 30 units Daily Novolog 0-15 units tid + hs  Ensure Max bid between meals A1c 8.5% on 10/31  Inpatient Diabetes Program Recommendations:    Glucose trends increase after PO intake  -   Consider adding Novolog 4 units tid meal coverage if eating >50% of meals.  Thanks,  Tama Headings RN, MSN, BC-ADM Inpatient Diabetes Coordinator Team Pager [phone] (8a-5p)

## 2022-11-09 NOTE — Progress Notes (Signed)
Pt removed cpap and requesting her oxygen be put back on.  While off cpap, sat as low as 71.  Pt reported 6/10 chest pain with oxygen off.  BP 135/80 with heart rate 93.  O2 reapplied with sats returning to 91-94%.  1 SL NTG given with relief.  Pt also requested muscle relaxer.  PRN administered per orders.

## 2022-11-09 NOTE — Progress Notes (Signed)
CSW received consult for Utilities and transportation resources. CSW offered patient community resources, transportation resources,and access [place] application. Patient accepted. All questions answered. No further questions reported at this time.

## 2022-11-09 NOTE — Progress Notes (Addendum)
Rounding Note    Patient Name: Katherine [name] Date of Encounter: 11/09/2022  [place] Cardiologist: [name], MD   Subjective   Patient reports overnight chest pain and dyspnea after she woke up with oxygen not on her face. She received nitroglycerin and had oxygen put back on with subsequent resolution of pain.  Inpatient Medications    Scheduled Meds:  apixaban  5 mg Oral BID   arformoterol  15 mcg Nebulization BID   azaTHIOprine  125 mg Oral Daily   budesonide  2 mL Nebulization BID   cariprazine  1.5 mg Oral Daily   clopidogrel  75 mg Oral Daily   fluticasone  1 spray Each Nare Daily   furosemide  40 mg Oral Daily   gabapentin  100 mg Oral QHS   insulin aspart  0-15 Units Subcutaneous TID WC   insulin aspart  0-5 Units Subcutaneous QHS   insulin glargine-yfgn  30 Units Subcutaneous Daily   isosorbide mononitrate  60 mg Oral Daily   lamoTRIgine  25 mg Oral QHS   metoprolol tartrate  25 mg Oral TID   montelukast  10 mg Oral QHS   pantoprazole  40 mg Oral Daily   potassium chloride  10 mEq Oral BID   predniSONE  5 mg Oral Q breakfast   Ensure Max Protein  11 oz Oral BID   ranolazine  1,000 mg Oral BID   rosuvastatin  20 mg Oral QHS   sertraline  200 mg Oral Daily   Continuous Infusions:  PRN Meds: acetaminophen, albuterol, bisacodyl, cyclobenzaprine, LORazepam, metoprolol tartrate, nitroGLYCERIN, ondansetron (ZOFRAN) IV, oxyCODONE, promethazine   Vital Signs    Vitals:   11/09/22 0023 11/09/22 0212 11/09/22 0409 11/09/22 0728  BP:  135/80 134/76 136/69  Pulse:  93 99 100  Resp:   17 20  Temp:   98.4 F (36.9 C) 98.3 F (36.8 C)  TempSrc:   Oral Oral  SpO2: 95% 92% 93% 97%  Weight:        Intake/Output Summary (Last 24 hours) at 11/09/2022 0822 Last data filed at 11/09/2022 0411 Gross per 24 hour  Intake 960 ml  Output 1800 ml  Net -840 ml      11/06/2022    3:00 PM 06/16/2022    2:55 PM 02/02/2022    2:36 PM  Last 3 Weights   Weight (lbs) 177 lb 4.8 oz 190 lb 192 lb  Weight (kg) 80.423 kg 86.183 kg 87.091 kg      Telemetry    Atrial fibrillation - Personally Reviewed  ECG    No new tracing - Personally Reviewed  Physical Exam   GEN: No acute distress.   Neck: No JVD Cardiac: irregularly irregular, no murmurs, rubs, or gallops.  Respiratory: lower lobe rhonchi bilaterally GI: Soft, nontender, non-distended  MS: No edema; No deformity. Neuro:  Nonfocal  Psych: Normal affect   Labs    High Sensitivity Troponin:   Recent Labs  Lab 11/06/22 0920 11/06/22 1539 11/06/22 1859  TROPONINIHS 27* 257* 428*     Chemistry Recent Labs  Lab 11/06/22 0920 11/06/22 1539 11/07/22 0125 11/07/22 2332 11/09/22 0222  NA 134*  --  133* 130* 130*  K 3.3*   < > 3.9 3.9 4.1  CL 97*  --  96* 97* 96*  CO2 26  --  29 25 25   GLUCOSE 174*  --  188* 239* 271*  BUN 12  --  13 15 14   CREATININE  1.10*  --  1.04* 1.10* 0.93  CALCIUM 8.2*  --  8.3* 8.2* 8.1*  PROT 6.4*  --   --   --   --   ALBUMIN 2.7*  --   --   --   --   AST 17  --   --   --   --   ALT 7  --   --   --   --   ALKPHOS 82  --   --   --   --   BILITOT 0.7  --   --   --   --   GFRNONAA 56*  --  60* 56* >60  ANIONGAP 11  --  8 8 9    < > = values in this interval not displayed.    Lipids No results for input(s): "CHOL", "TRIG", "HDL", "LABVLDL", "LDLCALC", "CHOLHDL" in the last 168 hours.  Hematology Recent Labs  Lab 11/07/22 0125 11/07/22 2332 11/09/22 0222  WBC 5.7 7.7 8.6  RBC 3.14* 3.05* 3.13*  HGB 9.7* 9.6* 9.8*  HCT 29.7* 28.6* 30.1*  MCV 94.6 93.8 96.2  MCH 30.9 31.5 31.3  MCHC 32.7 33.6 32.6  RDW 17.3* 17.3* 17.9*  PLT 231 227 198   Thyroid  Recent Labs  Lab 11/06/22 1539  TSH 1.609    BNP Recent Labs  Lab 11/06/22 0821 11/07/22 2332  BNP 262.3* 785.2*    DDimer No results for input(s): "DDIMER" in the last 168 hours.   Radiology    DG Chest Port 1 View  Result Date: 11/07/2022 CLINICAL DATA:  Hypoxia EXAM:  PORTABLE CHEST 1 VIEW COMPARISON:  11/06/2022 FINDINGS: Cardiac shadow is enlarged but stable. Postsurgical changes are again seen. Lungs demonstrate increased interstitial markings consistent with edema. No sizable effusion is noted. No pneumothorax is seen. No bony abnormality is noted. IMPRESSION: Increasing interstitial edema. Electronically Signed   By: Inez Catalina M.D.   On: 11/07/2022 22:16    Cardiac Studies   12/08/21 TTE   IMPRESSIONS     1. Left ventricular ejection fraction, by estimation, is 45 to 50%. The  left ventricle has mildly decreased function. The left ventricle  demonstrates regional wall motion abnormalities (see scoring  diagram/findings for description). There is mild  concentric left ventricular hypertrophy. Left ventricular diastolic  parameters are consistent with Grade III diastolic dysfunction  (restrictive). There is akinesis of the left ventricular, mid inferoseptal  wall. There is akinesis of the left ventricular,   apical septal wall.   2. Right ventricular systolic function is normal. The right ventricular  size is normal. There is normal pulmonary artery systolic pressure. The  estimated right ventricular systolic pressure is Q000111Q mmHg.   3. Left atrial size was mildly dilated.   4. The mitral valve is normal in structure. Trivial mitral valve  regurgitation. Mild to moderate mitral stenosis. Moderate mitral annular  calcification.   5. The aortic valve was not well visualized. There is moderate  calcification of the aortic valve. Aortic valve regurgitation is trivial.  Mild aortic valve stenosis. Aortic valve area, by VTI measures 1.34 cm.  Aortic valve mean gradient measures 10.0  mmHg. Aortic valve Vmax measures 2.20 m/s. LV Stroke Volume index 30, DVI  0.39.   6. The inferior vena cava is normal in size with greater than 50%  respiratory variability, suggesting right atrial pressure of 3 mmHg.   7. IT Issues from 12/08/21. Study resent  12/09/21 for read and review.   Comparison(s): Similar  to prior.   FINDINGS   Left Ventricle: Left ventricular ejection fraction, by estimation, is 45  to 50%. The left ventricle has mildly decreased function. The left  ventricle demonstrates regional wall motion abnormalities. Definity  contrast agent was given IV to delineate the  left ventricular endocardial borders. The left ventricular internal cavity  size was normal in size. There is mild concentric left ventricular  hypertrophy. Left ventricular diastolic function could not be evaluated  due to atrial fibrillation. Left  ventricular diastolic parameters are consistent with Grade III diastolic  dysfunction (restrictive).     LV Wall Scoring:  The apex is dyskinetic. The inferior septum is akinetic.   Right Ventricle: The right ventricular size is normal. No increase in  right ventricular wall thickness. Right ventricular systolic function is  normal. There is normal pulmonary artery systolic pressure. The tricuspid  regurgitant velocity is 2.75 m/s, and   with an assumed right atrial pressure of 3 mmHg, the estimated right  ventricular systolic pressure is Q000111Q mmHg.   Left Atrium: Left atrial size was mildly dilated.   Right Atrium: Right atrial size was normal in size.   Pericardium: There is no evidence of pericardial effusion.   Mitral Valve: The mitral valve is normal in structure. Moderate mitral  annular calcification. Trivial mitral valve regurgitation. Mild to  moderate mitral valve stenosis.   Tricuspid Valve: The tricuspid valve is normal in structure. Tricuspid  valve regurgitation is mild . No evidence of tricuspid stenosis.   Aortic Valve: The aortic valve was not well visualized. There is moderate  calcification of the aortic valve. Aortic valve regurgitation is trivial.  Mild aortic stenosis is present. Aortic valve mean gradient measures 10.0  mmHg. Aortic valve peak gradient   measures 19.4 mmHg.  Aortic valve area, by VTI measures 1.34 cm.   Pulmonic Valve: The pulmonic valve was normal in structure. Pulmonic valve  regurgitation is mild. No evidence of pulmonic stenosis.   Aorta: The aortic root is normal in size and structure.   Venous: The inferior vena cava is normal in size with greater than 50%  respiratory variability, suggesting right atrial pressure of 3 mmHg.   IAS/Shunts: No atrial level shunt detected by color flow Doppler.   Patient Profile     Katherine [name] is a 67 y.o. female with a hx of CAD s/p CABG and multiple PCI's, oxygen dependent COPD, chronic diastolic heart failure, PAD, carotid artery disease, hypertension, hyperlipidemia, paroxysmal A-fib on Eliquis,type 2 DM, GERD, hx of ESRD s/p renal transplant 1991 on chronic immunosuppression meds, anxiety with depression, CVA, chronic anemia, OSA, torsades/QT prolongation (in setting of electrolyte disturbance, Tikosyn, Zoloft, phenergan), obesity s/p prior lap band who is being seen 11/06/2022 for the evaluation of chest pain at the request of Dr. Truett Mainland.   Assessment & Plan    Chest pain CAD s/p CABG and multiple PCI's Hyperlipidemia   Patient with complex medical history presented to the ED with chest pain. ECG with stable appearing LBBB. Troponin 27->257->428.  Patient continues to have intermittent chest pain typically at night after she awakens without oxygen on. Pain relieved by nitroglycerin. Given that this provides consistent relief, will increase imdur to 90mg . Metoprolol tartrate switched to 50mg  BID to facilitate this change.  Continue Ranexa Continue Plavix Continue statin  Hypertension   BP stable on Imdur, metoprolol.   Chronic combined systolic/diastolic CHF   Patient with LVEF 45-50% on April 2023 TTE, initially with BNP 262.3, now 785.2 with  some increased interstitial edema noted on CXR.   Repeat echocardiogram ordered by primary team, if patient appears to have increased volume,  would plan for a day of IV diuresis. No obvious volume overload on physical exam. With current BP, no room to add ACEi/ARB/ARNI.  Paroxysmal atrial fibrillation   Continue Metoprolol for rate control, Eliquis for anticogulation.      For questions or updates, please contact [place] Please consult [email or web address] for contact info under        Signed, Lily Kocher, PA-C  11/09/2022, 8:22 AM     Agree with note by Lily Kocher, PA-C  Patient known to me in the past.  I performed PCI and stenting on her back in the 1990s.  She has had bypass surgery in 1995 and renal transplant 91.  She is a patient of Dr. Allison Quarry.  She does have lung disease on home O2 as well as other problems as noted above including PAF on Eliquis oral anticoagulation.  She has combined systolic and diastolic heart failure on oral diuretics as well as hypertension.  Her troponins were fairly low in the 400 range.  She is a chronic left bundle-branch block.  Her 2D echo shows an EF in the 35% range, somewhat lower than in the past although she was in A-fib at the time the echo was performed.  Her BNP is moderately elevated and she does have crackles at her bases.  Chest x-ray shows interstitial edema.  Otherwise, she does not have signs of volume overload.  There is no peripheral edema or increased JVP.  Will give her an extra dose of furosemide this afternoon.  We have adjusted her antianginals including Imdur and beta-blocker.  Will have PT walker in the hallway today.  I favor continued aggressive medical care.  I do not think she requires invasive evaluation.  Lorretta Harp, M.D., [place], [facility], [place] [address]. [place], [place]  [id number]  [phone] 11/09/2022 10:57 AM

## 2022-11-09 NOTE — Progress Notes (Signed)
Echocardiogram 2D Echocardiogram has been performed.  Ronny Flurry 11/09/2022, 9:50 AM

## 2022-11-09 NOTE — Progress Notes (Signed)
Mobility Specialist Progress Note:   11/09/22 1225  Mobility  Activity Transferred from bed to chair  Level of Assistance Contact guard assist, steadying assist  Assistive Device None  Distance Ambulated (ft) 5 ft  Activity Response Tolerated well  Mobility Referral Yes  $Mobility charge 1 Mobility   Pt declining ambulation at this time, agreeable to transfer to chair. Required no physical assistance throughout, only minG for safety. SpO2 WFL on 7LO2. Pt left in chair with all needs met.  Nelta Numbers Mobility Specialist Please contact via SecureChat or  Rehab office at [phone]

## 2022-11-09 NOTE — Progress Notes (Signed)
PROGRESS NOTE    Katherine [name]  [id number] DOB: 12-05-1955 DOA: 11/06/2022 PCP: [name], MD     Brief Narrative:  Katherine [name] is a 67 y.o. female with medical history significant of chronic hypoxic respiratory failure on 7 L continuously, COPD Gold stage I, CAD status post CABG and engraft stenting, PAD, OSA, HLD, chronic combined HFrEF and HFpEF, PAF on Eliquis, ESRD status post renal transplant, anxiety/depression, CVA, presented with chest pain.   This morning, patient woke up with severe pressure-like chest pain, "allover my chest" associated with increasing shortness of breath and palpitations.  No cough no abdominal pain no nausea vomiting or diarrhea.  Patient also complaining about foul smell urine for few days.  Last month, patient was treated for URI with antibiotics.  Recently, patient has been under a lot of stress in her life, she has been on benzos but still feels very "stressed out".  En route to ED, code heart was called by EMS, and patient was given nitroglycerin en route and chest pain subsided.   In the ED comparison of EKG, patient has a chronic LBBB, troponin first at 27.  Code heart was called off by cardiology.  Patient was started on heparin drip  New events last 24 hours / Subjective: No new complaints today.  Eating breakfast in bed.  Still having some intermittent chest pains.  Assessment & Plan:   Principal Problem:   Chest pain Active Problems:   Renal transplant disorder   CAD (coronary artery disease) of bypass graft   Essential hypertension   Paroxysmal atrial fibrillation (HCC); CHA2DS2VASc score F, HTN, CAD, CVA = 5   Type 2 diabetes mellitus with hyperlipidemia (HCC)   LBBB (left bundle branch block)   OSA (obstructive sleep apnea)   Current chronic use of systemic steroids   Chronic respiratory failure with hypoxia (HCC)   COPD (chronic obstructive pulmonary disease) (HCC)   History of renal transplant   Obesity (BMI  30-39.9)   Chronic combined systolic and diastolic heart failure (HCC)   CHF exacerbation (HCC)   Insulin-requiring or dependent type II diabetes mellitus (HCC)   Pressure injury of skin   Atypical chest pain CAD -Has had extensive CAD history status post CABG with multiple stenting -Continue Plavix, ranolazine, Imdur, Crestor, metoprolol  -Cardiology following -Echocardiogram pending  Paroxysmal A-fib -Metoprolol, Eliquis  Acute on chronic combined systolic diastolic heart failure -Lasix, metoprolol -CXR showed increasing interstitial edema  -Echocardiogram pending -Giving extra dose of IV Lasix this afternoon  Chronic hypoxemic respiratory failure -States that she has been on 7 L oxygen for the past year after her COVID diagnosis  Left arm lesion -Has been ongoing over the past several months, has outpatient surgery referral for removal  Debility -PT OT, recommending SNF placement, patient declining SNF.  Wants to go home.  TOC aware.  Depression with emotional stress  -Poorly controlled, has recently lost her brother who she was very close to -Declined chaplain support, available if needed  -Vraylar, Lamictal, Ativan, Zoloft  CKD stage IIIa, status post kidney transplant -Imuran, prednisone  COPD -Breathing treatments and inhalers  Insulin-dependent diabetes -Semglee, sliding scale insulin.  Add mealtime NovoLog today  Chronic back pain, narcotic dependent -Oxycodone, Flexeril    In agreement with assessment of the pressure ulcer as below: Pressure Injury 11/06/22 Sacrum Mid;Lower Stage 2 -  Partial thickness loss of dermis presenting as a shallow open injury with a red, pink wound bed without slough. (Active)  11/06/22  2000  Location: Sacrum  Location Orientation: Mid;Lower  Staging: Stage 2 -  Partial thickness loss of dermis presenting as a shallow open injury with a red, pink wound bed without slough.  Wound Description (Comments):   Present on  Admission: Yes  Dressing Type Foam - Lift dressing to assess site every shift 11/06/22 1937     DVT prophylaxis:  apixaban (ELIQUIS) tablet 5 mg  Code Status: DNR Family Communication: None Disposition Plan:  Status is: Inpatient Remains inpatient appropriate because: IV Lasix today.  Home health as patient is declining SNF placement   Antimicrobials:  Anti-infectives (From admission, onward)    Start     Dose/Rate Route Frequency Ordered Stop   11/06/22 1300  cefTRIAXone (ROCEPHIN) 2 g in sodium chloride 0.9 % 100 mL IVPB  Status:  Discontinued        2 g 200 mL/hr over 30 Minutes Intravenous Every 24 hours 11/06/22 1256 11/06/22 1729        Objective: Vitals:   11/09/22 0409 11/09/22 0728 11/09/22 0831 11/09/22 1127  BP: 134/76 136/69 111/70 115/74  Pulse: 99 100 98 94  Resp: 17 20  20   Temp: 98.4 F (36.9 C) 98.3 F (36.8 C)  97.8 F (36.6 C)  TempSrc: Oral Oral  Oral  SpO2: 93% 97%  91%  Weight:        Intake/Output Summary (Last 24 hours) at 11/09/2022 1308 Last data filed at 11/09/2022 0820 Gross per 24 hour  Intake 960 ml  Output 1800 ml  Net -840 ml    Filed Weights   11/06/22 1500  Weight: 80.4 kg    Examination:  General exam: Appears calm and comfortable  Respiratory system: Clear to auscultation. Respiratory effort normal. No respiratory distress. No conversational dyspnea.  Cardiovascular system: S1 & S2 heard, RRR. No murmurs. No pedal edema. Gastrointestinal system: Abdomen is nondistended, soft and nontender. Normal bowel sounds heard. Central nervous system: Alert and oriented. No focal neurological deficits. Speech clear.  Extremities: Symmetric in appearance  Psychiatry: Judgement and insight appear normal. Mood & affect appropriate.   Data Reviewed: I have personally reviewed following labs and imaging studies  CBC: Recent Labs  Lab 11/06/22 0821 11/06/22 0822 11/07/22 0125 11/07/22 2332 11/09/22 0222  WBC 5.7  --  5.7 7.7 8.6   NEUTROABS 4.0  --   --   --   --   HGB 11.6* 10.9* 9.7* 9.6* 9.8*  HCT 34.7* 32.0* 29.7* 28.6* 30.1*  MCV 95.1  --  94.6 93.8 96.2  PLT 262  --  231 227 [id number]    Basic Metabolic Panel: Recent Labs  Lab 11/06/22 0822 11/06/22 0920 11/06/22 1539 11/07/22 0125 11/07/22 2332 11/09/22 0222  NA 134* 134*  --  133* 130* 130*  K 5.2* 3.3* 2.9* 3.9 3.9 4.1  CL  --  97*  --  96* 97* 96*  CO2  --  26  --  29 25 25   GLUCOSE  --  174*  --  188* 239* 271*  BUN  --  12  --  13 15 14   CREATININE  --  1.10*  --  1.04* 1.10* 0.93  CALCIUM  --  8.2*  --  8.3* 8.2* 8.1*    GFR: Estimated Creatinine Clearance: 57.9 mL/min (by C-G formula based on SCr of 0.93 mg/dL). Liver Function Tests: Recent Labs  Lab 11/06/22 0920  AST 17  ALT 7  ALKPHOS 82  BILITOT 0.7  PROT 6.4*  ALBUMIN  2.7*    No results for input(s): "LIPASE", "AMYLASE" in the last 168 hours. No results for input(s): "AMMONIA" in the last 168 hours. Coagulation Profile: Recent Labs  Lab 11/06/22 0825  INR 1.6*    Cardiac Enzymes: No results for input(s): "CKTOTAL", "CKMB", "CKMBINDEX", "TROPONINI" in the last 168 hours. BNP (last 3 results) No results for input(s): "PROBNP" in the last 8760 hours. HbA1C: No results for input(s): "HGBA1C" in the last 72 hours. CBG: Recent Labs  Lab 11/08/22 1235 11/08/22 1547 11/08/22 2124 11/09/22 0727 11/09/22 1127  GLUCAP 289* 369* 89 220* 213*    Lipid Profile: No results for input(s): "CHOL", "HDL", "LDLCALC", "TRIG", "CHOLHDL", "LDLDIRECT" in the last 72 hours. Thyroid Function Tests: Recent Labs    11/06/22 1539  TSH 1.609    Anemia Panel: No results for input(s): "VITAMINB12", "FOLATE", "FERRITIN", "TIBC", "IRON", "RETICCTPCT" in the last 72 hours. Sepsis Labs: Recent Labs  Lab 11/07/22 2332  PROCALCITON <0.10     No results found for this or any previous visit (from the past 240 hour(s)).    Radiology Studies: DG Chest Port 1 View  Result Date:  11/07/2022 CLINICAL DATA:  Hypoxia EXAM: PORTABLE CHEST 1 VIEW COMPARISON:  11/06/2022 FINDINGS: Cardiac shadow is enlarged but stable. Postsurgical changes are again seen. Lungs demonstrate increased interstitial markings consistent with edema. No sizable effusion is noted. No pneumothorax is seen. No bony abnormality is noted. IMPRESSION: Increasing interstitial edema. Electronically Signed   By: Inez Catalina M.D.   On: 11/07/2022 22:16      Scheduled Meds:  apixaban  5 mg Oral BID   arformoterol  15 mcg Nebulization BID   azaTHIOprine  125 mg Oral Daily   budesonide  2 mL Nebulization BID   cariprazine  1.5 mg Oral Daily   clopidogrel  75 mg Oral Daily   fluticasone  1 spray Each Nare Daily   furosemide  40 mg Oral Daily   gabapentin  100 mg Oral QHS   insulin aspart  0-15 Units Subcutaneous TID WC   insulin aspart  0-5 Units Subcutaneous QHS   insulin aspart  4 Units Subcutaneous TID WC   insulin glargine-yfgn  30 Units Subcutaneous Daily   isosorbide mononitrate  90 mg Oral Daily   lamoTRIgine  25 mg Oral QHS   metoprolol tartrate  50 mg Oral BID   montelukast  10 mg Oral QHS   pantoprazole  40 mg Oral Daily   potassium chloride  10 mEq Oral BID   predniSONE  5 mg Oral Q breakfast   Ensure Max Protein  11 oz Oral BID   ranolazine  1,000 mg Oral BID   rosuvastatin  20 mg Oral QHS   sertraline  200 mg Oral Daily   Continuous Infusions:   LOS: 2 days   Time spent: 25 minutes   Dessa Phi, DO [facility] 11/09/2022, 1:08 PM   Available via Epic secure chat 7am-7pm After these hours, please refer to coverage provider listed on [email or web address]

## 2022-11-09 NOTE — TOC Initial Note (Signed)
Transition of Care [facility]) - Initial/Assessment Note    Patient Details  Name: Katherine [name] MRN: [id number] Date of Birth: [birth year]  Transition of Care [facility]) CM/SW Contact:    Katherine Roys, RN Phone Number: 11/09/2022, 2:28 PM  Clinical Narrative: Risk for readmission assessment completed. Case Manager received a secure chat from the weekend coverage that patient has declined SNF and wants to go home with [place]. Case Manager is very familiar with the patient due to several readmissions. Case Manager spoke with the patient and she is adamantly declining SNF- very tearful during the conversation stating please don't send me to a facility. Patient is currently home alone- brother that lived with her passed away a week ago. Patient has DME wheelchair, cane, oxygen 2 liters via [employer], and bedside commode. Patient had reported to provider that she is on 7 liters at home. (Confirmed with [employer] that the patients order is for 2 Liters). MD is aware that the patient will need to qualify for new liter flow and orders placed in EPIC. Patient will need a 10 Liter Concentrator delivered to the home. [place] Liaison for [employer] is following for orders. Patient reports that she has support from a [place] [phone] that visits daily to take her service dog out and to feed the dog. Patient states Katherine [name] may prepare her a small meal during the day. Case Manager asked to call Katherine [name] and patient states she will need to speak with Katherine [name] first. Patient did call Katherine [name] while Case Manager was in the room; however, no answer. Patient is only ambulating 20 ft., unsure how safe this discharge will be for home; however patient is wanting to return home and continues to decline facility. MD reports that patient has capacity. Patient reports she has a Event organiser and she will call for personal care services- Case Manager did ask the patient to call while in the room and she states she will  make the call on her own. Case Manager did receive permission to call Katherine [name] Niece [phone] and Katherine [name] states the patient is not making sound safe decisions and she will be home alone. Katherine [name] reports that the Landlord checks in to feed the dog and may heat up food occasionally for the patient; however, patient has no additional assistance. Niece states the patient never goes to appointments- Case Manager suggested that they seek out a provider that makes home visits. Case Manager discussed home health services with the patient (all agencies called in the service area- only agency willing to accept is [facility]- for [facility] RN and PT. Unable to arrange CSW since followed by DSS. Patient will need HH RN/PT orders. Case Manager attempted to find a Medicare Benefit; Katherine [name] states patient barely worked and probably has no Advertising account executive. Katherine [name] states that she will be placing a microwave and refrigerator in the patients room at home to make sure she can get to items easy. Katherine [name] works full time and states she can only visit once a week. Katherine [name] reports that family is trying to convince the patient to go to a facility for rehab. Plan will be for home at this time. Patient will need transportation home via ambulance.      Expected Discharge Plan: [place] Barriers to Discharge: Continued Medical Work up (Continues with IV diuresis.)   Patient Goals and CMS Choice Patient states their goals for this hospitalization and ongoing recovery are:: patient declines SNF and wants to return home.  CMS Medicare.gov Compare Post Acute Care list provided to::  (limited availabilty sent out to all agenceis in zipcode. [facility] is willing to accept.)      Expected Discharge Plan and Services In-house Referral: NA Discharge Planning Services: CM Consult Post Acute Care Choice: [place] arrangements for the past 2 months: Single Family Home                   DME Agency: NA      HH Arranged: RN, Disease Management, PT HH Agency: [facility] Date HH Agency Contacted: 11/09/22 Time HH Agency Contacted: 45 Representative spoke with at [place]: Katherine [name]  Prior Living Arrangements/Services Living arrangements for the past 2 months: [place] with:: Self (patient states she has a landlord that visit daily.) Patient language and need for interpreter reviewed:: Yes        Need for Family Participation in Patient Care: Yes (Comment) Care giver support system in place?: No (comment) (patient calling DSS for personal care services.) Current home services: DME (wheelchair, cane, walker O2 , bedside commode.) Criminal Activity/Legal Involvement Pertinent to Current Situation/Hospitalization: No - Comment as needed  Activities of Daily Living Home Assistive Devices/Equipment: Bedside commode/3-in-1, Wheelchair, Oxygen ADL Screening (condition at time of admission) Patient's cognitive ability adequate to safely complete daily activities?: Yes Is the patient deaf or have difficulty hearing?: No Does the patient have difficulty seeing, even when wearing glasses/contacts?: No Does the patient have difficulty concentrating, remembering, or making decisions?: No Patient able to express need for assistance with ADLs?: Yes Does the patient have difficulty dressing or bathing?: Yes Independently performs ADLs?: Yes (appropriate for developmental age) Does the patient have difficulty walking or climbing stairs?: Yes Weakness of Legs: Both Weakness of Arms/Hands: Both  Permission Sought/Granted Permission sought to share information with : Case Manager, Chartered certified accountant granted to share information with : Yes, Verbal Permission Granted     Permission granted to share info w AGENCY: [facility]        Emotional Assessment Appearance:: Appears stated age Attitude/Demeanor/Rapport: Unable to Assess Affect  (typically observed): Unable to Assess Orientation: : Oriented to Self, Oriented to Place Alcohol / Substance Use: Not Applicable Psych Involvement: No (comment)  Admission diagnosis:  NSTEMI (non-ST elevated myocardial infarction) ([place]) [I21.4] Chest pain [R07.9] Patient Active Problem List   Diagnosis Date Noted   Pressure injury of skin 11/07/2022   Chest pain 11/06/2022   Acute upper respiratory infection 10/14/2022   Acute cystitis without hematuria 06/30/2022   Diuretic-induced hypokalemia 06/17/2022   Insulin-requiring or dependent type II diabetes mellitus ([place]) 06/17/2022   Mass of skin of left forearm 06/16/2022   Spondylosis of lumbar region without myelopathy or radiculopathy 12/19/2021   Encounter for palliative care involving management of pain 12/19/2021   COPD with acute exacerbation ([place]) 12/07/2021   CHF exacerbation ([place]) 10/18/2021   Goals of care, counseling/discussion 10/03/2021   Class 2 obesity 09/25/2021   Rest pain of lower extremity due to atherosclerosis ([place]) 09/02/2021   Longstanding persistent atrial fibrillation (HCC)    Chronic combined systolic and diastolic heart failure ([place]) 08/06/2021   Obesity (BMI 30-39.9) 08/05/2021   Polymorphic ventricular tachycardia    Bradycardia    COPD (chronic obstructive pulmonary disease) ([place])    History of renal transplant    Coronary stent restenosis due to scar tissue    Chronic respiratory failure with hypoxia ([place]) 01/01/2021   Candidal skin infection 08/13/2020  Chronic bronchitis, mucopurulent ([place]) 08/24/2019   Current chronic use of systemic steroids 07/23/2019   OSA (obstructive sleep apnea)    Severe episode of recurrent major depressive disorder, with psychotic features ([place]) 02/16/2019   Obstructive chronic bronchitis without exacerbation 11/03/2018   Long term (current) use of antithrombotics/antiplatelets 04/28/2018   LBBB (left bundle branch block) 10/07/2017   Primary osteoarthritis of right  knee 08/18/2017   Ovarian cyst 05/18/2017   Mild aortic stenosis by prior echocardiogram 01/28/2017   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   Normocytic anemia 10/13/2016   Fibromyalgia 03/30/2016   Other spondylosis with radiculopathy, lumbar region 03/30/2016   Type 2 diabetes mellitus with hyperlipidemia ([place]) 03/30/2016   Paroxysmal atrial fibrillation ([place]); CHA2DS2VASc score F, HTN, CAD, CVA = 5 06/28/2014   Debility 06/28/2014   CAD S/P percutaneous coronary angioplasty - PCI x 5 to SVG-D1 09/11/2013    Class: Diagnosis of   Essential hypertension    PAD (peripheral artery disease) ([place]) 08/17/2013   CAD (coronary artery disease) of bypass graft 10/08/2012   Stenosis of right carotid artery without infarction 10/08/2012   Dyslipidemia, goal LDL below 70 10/08/2012    Class: Diagnosis of   Mitral annular calcification 10/08/2012   Renal transplant disorder 06/12/2012   Chronic allergic rhinitis 04/29/2011   Extrinsic asthma 09/09/2007   GERD 09/09/2007   Morbid obesity - s/p Lap Band 9/'05 05/07/2004    Class: Diagnosis of   PCP:  [name], MD Pharmacy:   [id number] - [place], [address] [place] [address] Phone: [phone] Fax: [phone]  [facility], [address] [place] [address] Phone: [phone] Fax: [phone]  Social Determinants of Health (SDOH) Social History: SDOH Screenings   Food Insecurity: No Food Insecurity (07/20/2022)  Housing: Low Risk  (07/20/2022)  Transportation Needs: Unmet Transportation Needs (11/07/2022)  Utilities: At Risk (07/20/2022)  Alcohol Screen: Low Risk  (12/10/2021)  Depression (PHQ2-9): Medium Risk (02/02/2022)  Tobacco Use: Medium Risk (11/06/2022)   SDOH Interventions: Transportation Interventions: Other (Comment) (resources provided. Patient accepted) Utilities Interventions: Other (Comment) (resources provided. Patient  accepted.)   Readmission Risk Interventions    11/09/2022    2:22 PM 12/12/2021   10:01 AM 09/23/2021    4:39 PM  Readmission Risk Prevention Plan  Transportation Screening Complete Complete Complete  Medication Review ([place]) Referral to Pharmacy Complete Complete  PCP or Specialist appointment within 3-5 days of discharge  Complete Complete  HRI or [place] Complete Complete Complete  SW Recovery Care/Counseling Consult Complete Complete Complete  Palliative Care Screening Not Applicable Not Applicable Complete  [place] Patient Refused Not Applicable Not Applicable

## 2022-11-09 NOTE — Plan of Care (Signed)

## 2022-11-10 ENCOUNTER — Other Ambulatory Visit (HOSPITAL_COMMUNITY): Payer: Self-pay

## 2022-11-10 DIAGNOSIS — R0789 Other chest pain: Secondary | ICD-10-CM | POA: Diagnosis not present

## 2022-11-10 DIAGNOSIS — I214 Non-ST elevation (NSTEMI) myocardial infarction: Secondary | ICD-10-CM | POA: Diagnosis not present

## 2022-11-10 LAB — BASIC METABOLIC PANEL
Anion gap: 10 (ref 5–15)
BUN: 12 mg/dL (ref 8–23)
CO2: 28 mmol/L (ref 22–32)
Calcium: 8.4 mg/dL — ABNORMAL LOW (ref 8.9–10.3)
Chloride: 94 mmol/L — ABNORMAL LOW (ref 98–111)
Creatinine, Ser: 0.88 mg/dL (ref 0.44–1.00)
GFR, Estimated: >60 mL/min (ref >60)
Glucose, Bld: 172 mg/dL — ABNORMAL HIGH (ref 70–99)
Potassium: 3.4 mmol/L — ABNORMAL LOW (ref 3.5–5.1)
Sodium: 132 mmol/L — ABNORMAL LOW (ref 135–145)

## 2022-11-10 LAB — GLUCOSE, CAPILLARY
Glucose-Capillary: 131 mg/dL — ABNORMAL HIGH (ref 70–99)
Glucose-Capillary: 166 mg/dL — ABNORMAL HIGH (ref 70–99)
Glucose-Capillary: 161 mg/dL — ABNORMAL HIGH (ref 70–99)
Glucose-Capillary: 249 mg/dL — ABNORMAL HIGH (ref 70–99)

## 2022-11-10 MED ORDER — SACUBITRIL-VALSARTAN 24-26 MG PO TABS
1.0000 | ORAL_TABLET | Freq: Two times a day (BID) | ORAL | 1 refills | Status: DC
  Filled 2022-11-10: qty 60, 30d supply, fill #0

## 2022-11-10 MED ORDER — METOPROLOL TARTRATE 50 MG PO TABS
50.0000 mg | ORAL_TABLET | Freq: Two times a day (BID) | ORAL | 1 refills | Status: DC
  Filled 2022-11-10: qty 60, 30d supply, fill #0

## 2022-11-10 MED ORDER — SACUBITRIL-VALSARTAN 24-26 MG PO TABS
1.0000 | ORAL_TABLET | Freq: Two times a day (BID) | ORAL | Status: DC
  Administered 2022-11-10 (×2): 1 via ORAL
  Filled 2022-11-10 (×2): qty 1

## 2022-11-10 MED ORDER — EMPAGLIFLOZIN 10 MG PO TABS
10.0000 mg | ORAL_TABLET | Freq: Every day | ORAL | Status: DC
  Administered 2022-11-10 – 2022-11-11 (×2): 10 mg via ORAL
  Filled 2022-11-10 (×2): qty 1

## 2022-11-10 MED ORDER — ISOSORBIDE MONONITRATE ER 30 MG PO TB24
90.0000 mg | ORAL_TABLET | Freq: Every day | ORAL | 1 refills | Status: DC
  Filled 2022-11-10: qty 90, 30d supply, fill #0

## 2022-11-10 MED ORDER — EMPAGLIFLOZIN 10 MG PO TABS
10.0000 mg | ORAL_TABLET | Freq: Every day | ORAL | 1 refills | Status: DC
  Filled 2022-11-10: qty 30, 30d supply, fill #0

## 2022-11-10 MED ORDER — CLOPIDOGREL BISULFATE 75 MG PO TABS
75.0000 mg | ORAL_TABLET | Freq: Every day | ORAL | 1 refills | Status: DC
  Filled 2022-11-10: qty 30, 30d supply, fill #0

## 2022-11-10 MED ORDER — POTASSIUM CHLORIDE CRYS ER 20 MEQ PO TBCR
40.0000 meq | EXTENDED_RELEASE_TABLET | Freq: Once | ORAL | Status: AC
  Administered 2022-11-10: 40 meq via ORAL
  Filled 2022-11-10: qty 2

## 2022-11-10 MED ORDER — FUROSEMIDE 40 MG PO TABS
40.0000 mg | ORAL_TABLET | Freq: Two times a day (BID) | ORAL | Status: DC
  Administered 2022-11-10 – 2022-11-11 (×2): 40 mg via ORAL
  Filled 2022-11-10 (×2): qty 1

## 2022-11-10 MED ORDER — FUROSEMIDE 40 MG PO TABS
40.0000 mg | ORAL_TABLET | Freq: Two times a day (BID) | ORAL | 1 refills | Status: DC
  Filled 2022-11-10: qty 60, 30d supply, fill #0

## 2022-11-10 NOTE — Progress Notes (Addendum)
SATURATION QUALIFICATIONS: (This note is used to comply with regulatory documentation for home oxygen)  Patient Saturations on room air at rest = 80% --patient desaturated after a minute of room air before putting 3 liters back on   Patient Saturations on 3 liters of oxygen while dangling on edge of bed = 73%  Patient Saturations on 5 liters of oxygen while dangling on edge of bed = 84%  Please briefly explain why patient needs home oxygen:   Patient reports wearing 7 liters of oxygen baseline since having COVID. Pt is not ambulatory. While dangling on edge of bed RN  turned patients oxygen off. After a minute with no oxygen, patient was desaturating to 80%. I put patient on 3 liters of oxygen and was still desaturating in the 70's. Increased oxygen to 5 liters and saturation started to increase to mid 80's. Patient fully recovered on 7 liters oxygen.

## 2022-11-10 NOTE — Progress Notes (Signed)
Physical Therapy Treatment Patient Details Name: Katherine [name] MRN: [id number] DOB: [birth year] Today's Date: 11/10/2022   History of Present Illness Pt is a 67 y/o F presenting to ED on 3/22 with chest pain and elevated troponin, chest pain resolved with nitroglycerin. Cardiology following pt. PMH includes chronic hypoxic respiratry failure on 7L O2 continuosly, COPD Gold Stage I, CAD s/p CABG and endgraft stenting, PAD, OSA, HLD, chronic combined HFrEF and HFpEF, PAF on Eliquis, ESRD s/p renal transplant, anxiety/depression, CVA.    PT Comments    Pt with fair tolerance to treatment today. Pt was able to ambulate short distance in room today however was limited by fatigue and lethargy. Pt was received on 9L satting at 88%. Pt titrated to 10L and recovered 91% however dropped to 86% on 10L during ambulation. Pt left on 11L satting at 90%. Pt cued for pursed lip breathing during session. RN made aware. No change in DC/DME recs at this time. Pt will continue to follow.    Recommendations for follow up therapy are one component of a multi-disciplinary discharge planning process, led by the attending physician.  Recommendations may be updated based on patient status, additional functional criteria and insurance authorization.  Follow Up Recommendations  Can patient physically be transported by private vehicle: Yes    Assistance Recommended at Discharge Intermittent Supervision/Assistance  Patient can return home with the following A little help with walking and/or transfers;A little help with bathing/dressing/bathroom;Assistance with cooking/housework;Help with stairs or ramp for entrance   Equipment Recommendations  None recommended by PT    Recommendations for Other Services       Precautions / Restrictions Precautions Precautions: Fall Precaution Comments: watch O2 Restrictions Weight Bearing Restrictions: No     Mobility  Bed Mobility Overal bed mobility: Needs  Assistance Bed Mobility: Supine to Sit, Sit to Supine     Supine to sit: Supervision Sit to supine: Supervision        Transfers Overall transfer level: Needs assistance Equipment used: Rolling walker (2 wheels) Transfers: Sit to/from Stand Sit to Stand: Min guard                Ambulation/Gait Ambulation/Gait assistance: Min guard Gait Distance (Feet): 10 Feet Assistive device: Rolling walker (2 wheels) Gait Pattern/deviations: Step-through pattern, Decreased stride length Gait velocity: decreased     General Gait Details: Forwards and backwards stepping near EOB. Pt reported being too tired to mobilize further. O2 sats dropped to 86% on 10L. RN made aware   Stairs             Wheelchair Mobility    Modified Rankin (Stroke Patients Only)       Balance Overall balance assessment: Needs assistance Sitting-balance support: Feet supported Sitting balance-Leahy Scale: Good Sitting balance - Comments: sits statically at EOB   Standing balance support: Bilateral upper extremity supported Standing balance-Leahy Scale: Fair Standing balance comment: Reliant on RW                            Cognition Arousal/Alertness: Lethargic Behavior During Therapy: WFL for tasks assessed/performed Overall Cognitive Status: Within Functional Limits for tasks assessed                                 General Comments: Pt reported feeling very sleepy throughout session        Exercises  General Comments General comments (skin integrity, edema, etc.): Pt received on 9L satting at 88%. Pt titrated to 10L and recovered 91% however dropped to 86% on 10L during ambulation. Pt left on 11L satting at 90%. Pt cued for pursed lip breathing during session. RN made aware.      Pertinent Vitals/Pain Pain Assessment Pain Assessment: No/denies pain    Home Living                          Prior Function            PT Goals  (current goals can now be found in the care plan section) Progress towards PT goals: Progressing toward goals    Frequency    Min 1X/week      PT Plan Current plan remains appropriate    Co-evaluation              AM-PAC PT "6 Clicks" Mobility   Outcome Measure  Help needed turning from your back to your side while in a flat bed without using bedrails?: None Help needed moving from lying on your back to sitting on the side of a flat bed without using bedrails?: A Little Help needed moving to and from a bed to a chair (including a wheelchair)?: A Little Help needed standing up from a chair using your arms (e.g., wheelchair or bedside chair)?: A Little Help needed to walk in hospital room?: A Little Help needed climbing 3-5 steps with a railing? : A Lot 6 Click Score: 18    End of Session Equipment Utilized During Treatment: Gait belt;Oxygen Activity Tolerance: Patient limited by lethargy;Patient limited by fatigue Patient left: in bed;with call bell/phone within reach;with bed alarm set Nurse Communication: Mobility status;Other (comment) (O2 sats) PT Visit Diagnosis: Other abnormalities of gait and mobility (R26.89);Muscle weakness (generalized) (M62.81)     Time: [id number] PT Time Calculation (min) (ACUTE ONLY): 24 min  Charges:  $Gait Training: 8-22 mins $Therapeutic Activity: 8-22 mins                     Shelby Mattocks, PT, DPT Acute Rehab Services [id number]    Viann Shove 11/10/2022, 12:46 PM

## 2022-11-10 NOTE — TOC Benefit Eligibility Note (Signed)
Patient Teacher, English as a foreign language completed.    The patient is currently admitted and upon discharge could be taking Entresto 24-26 mg.  The current 30 day co-pay is $0.00.   The patient is currently admitted and upon discharge could be taking Farxiga 10 mg.  The current 30 day co-pay is $0.00.   The patient is currently admitted and upon discharge could be taking Jardiance 10 mg.  The current 30 day co-pay is $0.00.   The patient is insured through [facility] Part D   This test claim was processed through [place] amounts may vary at other pharmacies due to pharmacy/plan contracts, or as the patient moves through the different stages of their insurance plan.  Lyndel Safe, [place] Patient Advocate Specialist [place] Patient Advocate Team Direct Number: [phone]  Fax: [phone]

## 2022-11-10 NOTE — Discharge Summary (Signed)
Physician Discharge Summary  Katherine [name] [id number] DOB: [birth year] DOA: 11/06/2022  PCP: [name], MD  Admit date: 11/06/2022 Discharge date: 11/10/2022  Admitted From: Home Disposition:  Home, patient refused SNF   Recommendations for Outpatient Follow-up:  Follow up with PCP Follow up with Cardiology  Discharge Condition: Stable CODE STATUS: DNR  Diet recommendation: Heart healthy   Brief/Interim Summary: Analyssa [name] is a 67 y.o. female with medical history significant of chronic hypoxic respiratory failure on 7 L continuously, COPD Gold stage I, CAD status post CABG and engraft stenting, PAD, OSA, HLD, chronic combined HFrEF and HFpEF, PAF on Eliquis, ESRD status post renal transplant, anxiety/depression, CVA, presented with chest pain.   This morning, patient woke up with severe pressure-like chest pain, "allover my chest" associated with increasing shortness of breath and palpitations.  No cough no abdominal pain no nausea vomiting or diarrhea.  Patient also complaining about foul smell urine for few days.  Last month, patient was treated for URI with antibiotics.  Recently, patient has been under a lot of stress in her life, she has been on benzos but still feels very "stressed out".  En route to ED, code heart was called by EMS, and patient was given nitroglycerin en route and chest pain subsided.   In the ED comparison of EKG, patient has a chronic LBBB, troponin first at 27.  Code heart was called off by cardiology.  Patient was started on heparin drip.   Patient was monitored closely.  Echocardiogram revealed EF 35 to 40% with dyskinesis of mid to distal inferior septal wall and entire apex.  Patient's cardiac medication was adjusted, felt that patient had atypical chest pain in setting of extensive CAD.  Patient was seen by physical therapy and recommended for SNF placement.  Despite multiple conversations with SNF placement recommendations, patient refused  and wanted to go home.  Discharge Diagnoses:   Principal Problem:   Chest pain Active Problems:   Renal transplant disorder   CAD (coronary artery disease) of bypass graft   Essential hypertension   Paroxysmal atrial fibrillation ([place]); CHA2DS2VASc score F, HTN, CAD, CVA = 5   Type 2 diabetes mellitus with hyperlipidemia (HCC)   LBBB (left bundle branch block)   OSA (obstructive sleep apnea)   Current chronic use of systemic steroids   Chronic respiratory failure with hypoxia (HCC)   COPD (chronic obstructive pulmonary disease) (HCC)   History of renal transplant   Obesity (BMI 30-39.9)   Chronic combined systolic and diastolic heart failure (HCC)   CHF exacerbation (HCC)   Insulin-requiring or dependent type II diabetes mellitus (HCC)   Pressure injury of skin    Atypical chest pain CAD -Has had extensive CAD history status post CABG with multiple stenting -Continue Plavix, ranolazine, Imdur, Crestor, metoprolol  -Cardiology following -Echocardiogram as below -Recommended medical treatment   Paroxysmal A-fib -Metoprolol, Eliquis   Acute on chronic combined systolic diastolic heart failure -Lasix, metoprolol -CXR showed increasing interstitial edema  -Echocardiogram revealed EF 35 to 40% -Increase Lasix, added Entresto and Jardiance    Chronic hypoxemic respiratory failure -States that she has been on 7 L oxygen for the past year after her COVID diagnosis   Left arm lesion -Has been ongoing over the past several months, has outpatient surgery referral for removal   Debility -PT OT, recommending SNF placement, patient declining SNF.  Wants to go home.  TOC aware.   Depression with emotional stress  -Poorly controlled, has recently lost  her brother who she was very close to -Psychologist, sport and exercise support, available if needed  -Vraylar, Lamictal, Ativan, Zoloft   CKD stage IIIa, status post kidney transplant -Imuran, prednisone   COPD -Breathing treatments and  inhalers   Insulin-dependent diabetes -Semglee, sliding scale insulin.  Add mealtime NovoLog today   Chronic back pain, narcotic dependent -Oxycodone, Flexeril  Hypokalemia -Replace   In agreement with assessment of the pressure ulcer as below:  Pressure Injury 11/06/22 Sacrum Mid;Lower Stage 2 -  Partial thickness loss of dermis presenting as a shallow open injury with a red, pink wound bed without slough. (Active)  11/06/22 2000  Location: Sacrum  Location Orientation: Mid;Lower  Staging: Stage 2 -  Partial thickness loss of dermis presenting as a shallow open injury with a red, pink wound bed without slough.  Wound Description (Comments):   Present on Admission: Yes  Dressing Type Foam - Lift dressing to assess site every shift 11/10/22 0845       Discharge Instructions  Discharge Instructions     (HEART FAILURE PATIENTS) Call MD:  Anytime you have any of the following symptoms: 1) 3 pound weight gain in 24 hours or 5 pounds in 1 week 2) shortness of breath, with or without a dry hacking cough 3) swelling in the hands, feet or stomach 4) if you have to sleep on extra pillows at night in order to breathe.   Complete by: As directed    Call MD for:  difficulty breathing, headache or visual disturbances   Complete by: As directed    Call MD for:  extreme fatigue   Complete by: As directed    Call MD for:  persistant dizziness or light-headedness   Complete by: As directed    Call MD for:  persistant nausea and vomiting   Complete by: As directed    Call MD for:  severe uncontrolled pain   Complete by: As directed    Call MD for:  temperature >100.4   Complete by: As directed    Diet - low sodium heart healthy   Complete by: As directed    Discharge instructions   Complete by: As directed    You were cared for by a hospitalist during your hospital stay. If you have any questions about your discharge medications or the care you received while you were in the hospital  after you are discharged, you can call the unit and ask to speak with the hospitalist on call if the hospitalist that took care of you is not available. Once you are discharged, your primary care physician will handle any further medical issues. Please note that NO REFILLS for any discharge medications will be authorized once you are discharged, as it is imperative that you return to your primary care physician (or establish a relationship with a primary care physician if you do not have one) for your aftercare needs so that they can reassess your need for medications and monitor your lab values.   Increase activity slowly   Complete by: As directed    No wound care   Complete by: As directed       Allergies as of 11/10/2022       Reactions   Tetracycline Hives   Patient tolerated Doxycycline Dec 2020   Niacin Other (See Comments)   Mouth blisters   Niaspan [niacin Er] Other (See Comments)   Mouth blisters   Sulfa Antibiotics Nausea Only, Other (See Comments)   "Tears up stomach"  Sulfonamide Derivatives Other (See Comments)   Reaction: per patient "tears her stomach up"   Codeine Nausea And Vomiting   Erythromycin Nausea And Vomiting   Hydromorphone Hcl Nausea And Vomiting   Morphine And Related Nausea And Vomiting   Nalbuphine Nausea And Vomiting   Nubain   Sulfasalazine Nausea Only, Other (See Comments)   per patient "tears her stomach up", "Tears up stomach"   Tape Rash, Other (See Comments)   No "plastic" tape," please----cloth tape only        Medication List     STOP taking these medications    amoxicillin-clavulanate 875-125 MG tablet Commonly known as: AUGMENTIN       TAKE these medications    albuterol 108 (90 Base) MCG/ACT inhaler Commonly known as: Ventolin HFA TAKE 2 PUFFS BY MOUTH EVERY 6 HOURS AS NEEDED FOR WHEEZE OR SHORTNESS OF BREATH What changed:  how much to take how to take this when to take this reasons to take this additional  instructions   arformoterol 15 MCG/2ML Nebu Commonly known as: BROVANA Take 2 mLs (15 mcg total) by nebulization 2 (two) times daily.   azaTHIOprine 50 MG tablet Commonly known as: IMURAN Take 2 & 1/2 tablets (125 mg) by mouth daily at 3pm What changed: when to take this   Basaglar KwikPen 100 UNIT/ML Inject 30 Units into the skin daily. What changed: how much to take   bisacodyl 5 MG EC tablet Commonly known as: DULCOLAX Take 1 tablet (5 mg total) by mouth daily as needed for moderate constipation.   budesonide 0.5 MG/2ML nebulizer solution Commonly known as: PULMICORT Take 2 mLs (0.5 mg total) by nebulization 2 (two) times daily. What changed:  when to take this reasons to take this   calcitRIOL 0.25 MCG capsule Commonly known as: ROCALTROL Take 0.25 mcg by mouth See admin instructions. Takes every 3 days.   cariprazine 1.5 MG capsule Commonly known as: Vraylar Take 1 capsule (1.5 mg total) by mouth daily.   clopidogrel 75 MG tablet Commonly known as: PLAVIX Take 1 tablet (75 mg total) by mouth daily. Start taking on: November 11, 2022   [employer] Vitamin C 500 MG tablet Generic drug: ascorbic acid TAKE 1 TABLET (500 MG TOTAL) BY MOUTH DAILY. What changed: how much to take   cyclobenzaprine 10 MG tablet Commonly known as: FLEXERIL TAKE 1 TABLET BY MOUTH TWICE A DAY AS NEEDED FOR MUSCLE SPASMS What changed: See the new instructions.   Eliquis 5 MG Tabs tablet Generic drug: apixaban TAKE 1 TABLET BY MOUTH TWICE A DAY   empagliflozin 10 MG Tabs tablet Commonly known as: JARDIANCE Take 1 tablet (10 mg total) by mouth daily. Start taking on: November 11, 2022   Ensure Max Protein Liqd Take 330 mLs (11 oz total) by mouth 2 (two) times daily.   fluticasone 50 MCG/ACT nasal spray Commonly known as: FLONASE PLACE 2 SPRAYS INTO BOTH NOSTRILS 2 TIMES DAILY.   FreeStyle Libre 2 Reader Scribner 1 Act by Does not apply route daily.   FreeStyle Libre 2 Sensor Misc 1 Act by  Does not apply route daily.   furosemide 40 MG tablet Commonly known as: LASIX Take 1 tablet (40 mg total) by mouth 2 (two) times daily. What changed: when to take this   gabapentin 100 MG capsule Commonly known as: NEURONTIN Take 1 capsule (100 mg total) by mouth at bedtime. What changed:  when to take this reasons to take this   glimepiride 4 MG  tablet Commonly known as: AMARYL Take 4 mg by mouth daily with breakfast.   isosorbide mononitrate 30 MG 24 hr tablet Commonly known as: IMDUR Take 3 tablets (90 mg total) by mouth daily. Start taking on: November 11, 2022 What changed:  medication strength how much to take   Klor-Con M10 10 MEQ tablet Generic drug: potassium chloride TAKE 1 TABLET BY MOUTH 2 TIMES DAILY.   lamoTRIgine 25 MG tablet Commonly known as: LAMICTAL TAKE 1 TABLET BY MOUTH EVERYDAY AT BEDTIME What changed: See the new instructions.   LORazepam 1 MG tablet Commonly known as: ATIVAN Take 1 tablet (1 mg total) by mouth every 8 (eight) hours as needed for anxiety.   metFORMIN 500 MG tablet Commonly known as: GLUCOPHAGE Take 500 mg by mouth 2 (two) times daily.   metoprolol tartrate 50 MG tablet Commonly known as: LOPRESSOR Take 1 tablet (50 mg total) by mouth 2 (two) times daily. What changed:  medication strength how much to take when to take this   montelukast 10 MG tablet Commonly known as: SINGULAIR TAKE 1 TABLET BY MOUTH EVERYDAY AT BEDTIME What changed: See the new instructions.   multivitamin with minerals Tabs tablet Take 1 tablet by mouth daily.   nitroGLYCERIN 0.4 MG SL tablet Commonly known as: NITROSTAT PLACE 1 TAB UNDER TONGUE EVERY 5MINS FOR CHEST PAIN What changed:  how much to take how to take this when to take this reasons to take this additional instructions   NovoLOG FlexPen 100 UNIT/ML FlexPen Generic drug: insulin aspart Inject 4-12 Units into the skin 3 (three) times daily with meals. Sliding scale.   Oxycodone  HCl 10 MG Tabs Take 1 tablet (10 mg total) by mouth every 8 (eight) hours as needed. What changed: reasons to take this   OXYGEN Inhale 4 L into the lungs continuous.   pantoprazole 40 MG tablet Commonly known as: PROTONIX TAKE 1 TABLET BY MOUTH EVERY DAY   Pen Needles 32G X 4 MM Misc Use as needed to administer insulin. E11.9   predniSONE 5 MG tablet Commonly known as: DELTASONE Take 5 mg by mouth daily with breakfast.   promethazine 25 MG tablet Commonly known as: PHENERGAN Take 1 tablet (25 mg total) by mouth every 6 (six) hours as needed for nausea or vomiting.   ranolazine 1000 MG SR tablet Commonly known as: RANEXA TAKE 1 TABLET BY MOUTH TWICE A DAY   rosuvastatin 20 MG tablet Commonly known as: CRESTOR TAKE 1 TABLET BY MOUTH EVERY DAY   sacubitril-valsartan 24-26 MG Commonly known as: ENTRESTO Take 1 tablet by mouth 2 (two) times daily.   sertraline 100 MG tablet Commonly known as: ZOLOFT Take 2 tablets (200 mg total) by mouth daily.               Durable Medical Equipment  (From admission, onward)           Start     Ordered   11/10/22 1244  For home use only DME oxygen  Once       Question Answer Comment  Length of Need Lifetime   Mode or (Route) Nasal cannula   Liters per Minute 7   Frequency Continuous (stationary and portable oxygen unit needed)   Oxygen conserving device Yes   Oxygen delivery system Gas      11/10/22 1243            Follow-up Information     [facility] Follow up.   Specialty: Home  Health Services Why: Physical Therapy and Registered Nurse-office to call with visit times. Contact information: [address] [place] [address] [phone]         [name], MD Follow up.   Specialty: Internal Medicine Contact information: [place] [address] [phone]         [name], NP. Go on 11/20/2022.   Specialties: Cardiology, Family  Medicine Contact information: [address] [place] [address] [phone]                Allergies  Allergen Reactions   Tetracycline Hives    Patient tolerated Doxycycline Dec 2020   Niacin Other (See Comments)    Mouth blisters   Niaspan [Niacin Er] Other (See Comments)    Mouth blisters   Sulfa Antibiotics Nausea Only and Other (See Comments)    "Tears up stomach"   Sulfonamide Derivatives Other (See Comments)    Reaction: per patient "tears her stomach up"   Codeine Nausea And Vomiting   Erythromycin Nausea And Vomiting   Hydromorphone Hcl Nausea And Vomiting   Morphine And Related Nausea And Vomiting   Nalbuphine Nausea And Vomiting    Nubain   Sulfasalazine Nausea Only and Other (See Comments)    per patient "tears her stomach up", "Tears up stomach"   Tape Rash and Other (See Comments)    No "plastic" tape," please----cloth tape only    Consultations: Cardiology    Procedures/Studies: ECHOCARDIOGRAM COMPLETE  Result Date: 11/09/2022    ECHOCARDIOGRAM REPORT   Patient Name:   ANNACLAIRE PERRIER Date of Exam: 11/09/2022 Medical Rec #:  [id number]          Height:       61.0 in Accession #:    [id number]         Weight:       177.3 lb Date of Birth:  1955/11/15          BSA:          1.794 m Patient Age:    26 years           BP:           136/69 mmHg Patient Gender: F                  HR:           102 bpm. Exam Location:  Inpatient Procedure: 2D Echo, Cardiac Doppler and Color Doppler Indications:    Chest Pain R07.9  History:        Patient has prior history of Echocardiogram examinations, most                 recent 12/09/2021. CHF, CAD, PAD and COPD, Arrythmias:LBBB,                 Atrial Fibrillation, Tachycardia and Bradycardia,                 Signs/Symptoms:Chest Pain; Risk Factors:Hypertension, Diabetes,                 Dyslipidemia and Sleep Apnea. ESRD.  Sonographer:    Ronny Flurry Referring Phys: [id number] Morganna Styles  Sonographer  Comments: Image acquisition challenging due to uncooperative patient. IMPRESSIONS  1. Technically difficult study with limited visualization of cardiac structures.  2.     The LV is not well visualized. Overall EF seems to be in the 35-40% range with dyskinesis of  the mid to distal inferoseptal wall and entire apex. The anterior wall is never imaged well  3.     Right ventricular systolic function is normal. The right ventricular size is normal.  4. The mitral valve is normal in structure. Mild mitral valve regurgitation. No evidence of mitral stenosis. Moderate mitral annular calcification.  5. The aortic valve is moderately calcified with fusion of the right and left coronary cusps. The aortic valve is tricuspid. There is moderate calcification of the aortic valve. Aortic valve regurgitation is trivial. Mild aortic valve stenosis. Aortic valve area, by VTI measures 1.65 cm. Aortic valve mean gradient measures 9.0 mmHg. Aortic valve Vmax measures 2.13 m/s.  6. The inferior vena cava is normal in size with greater than 50% respiratory variability, suggesting right atrial pressure of 3 mmHg.  7. Left atrial size was moderately dilated.  8. Aortic dilatation noted. There is borderline dilatation of the aortic root, measuring 38 mm. FINDINGS  Left Ventricle: The LV is not well visualized. Overall EF seems to be in the 35-40% range with dyskinesis of the mid to distal inferoseptal wall and entire apex. The anterior wall is never imaged well. Left ventricular ejection fraction, by estimation, is 35 to 40%. The left ventricle has moderately decreased function. The left ventricle demonstrates regional wall motion abnormalities. The left ventricular internal cavity size was normal in size. There is mild concentric left ventricular hypertrophy. Left ventricular diastolic parameters are indeterminate. Right Ventricle: The right ventricular size is normal. No increase in right ventricular wall thickness. Right ventricular  systolic function is normal. Left Atrium: Left atrial size was moderately dilated. Right Atrium: Right atrial size was normal in size. Pericardium: There is no evidence of pericardial effusion. Mitral Valve: The mitral valve is normal in structure. Moderate mitral annular calcification. Mild mitral valve regurgitation. No evidence of mitral valve stenosis. MV peak gradient, 9.6 mmHg. The mean mitral valve gradient is 4.0 mmHg. Tricuspid Valve: The tricuspid valve is normal in structure. Tricuspid valve regurgitation is trivial. No evidence of tricuspid stenosis. Aortic Valve: The aortic valve is moderately calcified with fusion of the right and left coronary cusps. The aortic valve is tricuspid. There is moderate calcification of the aortic valve. Aortic valve regurgitation is trivial. Mild aortic stenosis is present. Aortic valve mean gradient measures 9.0 mmHg. Aortic valve peak gradient measures 18.1 mmHg. Aortic valve area, by VTI measures 1.65 cm. Pulmonic Valve: The pulmonic valve was normal in structure. Pulmonic valve regurgitation is not visualized. No evidence of pulmonic stenosis. Aorta: Aortic dilatation noted. There is borderline dilatation of the aortic root, measuring 38 mm. Venous: The inferior vena cava is normal in size with greater than 50% respiratory variability, suggesting right atrial pressure of 3 mmHg. IAS/Shunts: No atrial level shunt detected by color flow Doppler.  LEFT VENTRICLE PLAX 2D LVIDd:         4.60 cm   Diastology LVIDs:         3.40 cm   LV e' lateral:   9.90 cm/s LV PW:         1.10 cm   LV E/e' lateral: 12.4 LV IVS:        1.10 cm LVOT diam:     2.20 cm LV SV:         55 LV SV Index:   31 LVOT Area:     3.80 cm  RIGHT VENTRICLE            IVC RV S prime:  7.71 cm/s  IVC diam: 1.50 cm TAPSE (M-mode): 1.5 cm LEFT ATRIUM           Index        RIGHT ATRIUM           Index LA diam:      4.20 cm 2.34 cm/m   RA Area:     12.90 cm LA Vol (A4C): 87.3 ml 48.65 ml/m  RA Volume:    27.10 ml  15.10 ml/m  AORTIC VALVE AV Area (Vmax):    1.90 cm AV Area (Vmean):   1.81 cm AV Area (VTI):     1.65 cm AV Vmax:           212.67 cm/s AV Vmean:          135.667 cm/s AV VTI:            0.333 m AV Peak Grad:      18.1 mmHg AV Mean Grad:      9.0 mmHg LVOT Vmax:         106.25 cm/s LVOT Vmean:        64.600 cm/s LVOT VTI:          0.144 m LVOT/AV VTI ratio: 0.43  AORTA Ao Root diam: 3.30 cm Ao Asc diam:  3.75 cm MITRAL VALVE                TRICUSPID VALVE MV Area VTI:  1.91 cm      TR Peak grad:   25.4 mmHg MV Peak grad: 9.6 mmHg      TR Vmax:        252.00 cm/s MV Mean grad: 4.0 mmHg MV Vmax:      1.55 m/s      SHUNTS MV Vmean:     91.7 cm/s     Systemic VTI:  0.14 m MV E velocity: 123.00 cm/s  Systemic Diam: 2.20 cm [name] MD Electronically signed by [name] MD Signature Date/Time: 11/09/2022/5:04:46 PM    Final    DG Chest Port 1 View  Result Date: 11/07/2022 CLINICAL DATA:  Hypoxia EXAM: PORTABLE CHEST 1 VIEW COMPARISON:  11/06/2022 FINDINGS: Cardiac shadow is enlarged but stable. Postsurgical changes are again seen. Lungs demonstrate increased interstitial markings consistent with edema. No sizable effusion is noted. No pneumothorax is seen. No bony abnormality is noted. IMPRESSION: Increasing interstitial edema. Electronically Signed   By: Inez Catalina M.D.   On: 11/07/2022 22:16   DG Chest Port 1 View  Result Date: 11/06/2022 CLINICAL DATA:  Chest pain and dyspnea EXAM: PORTABLE CHEST 1 VIEW COMPARISON:  02/02/2022 chest radiograph. FINDINGS: Stable mid to lower sternotomy wire discontinuities. CABG clips overlie the mediastinum. Stable cardiomediastinal silhouette with top-normal heart size. No pneumothorax. No pleural effusion. No acute consolidative airspace disease. No overt pulmonary edema. Chronic coarsened interstitial and patchy reticular opacities throughout both lungs. IMPRESSION: No acute cardiopulmonary disease. Chronic coarsened interstitial and patchy  reticular opacities throughout both lungs, favor nonspecific scarring versus chronic interstitial lung disease. Electronically Signed   By: Ilona Sorrel M.D.   On: 11/06/2022 08:49       Discharge Exam: Vitals:   11/10/22 0758 11/10/22 0816  BP: 125/68   Pulse: 95   Resp: 20   Temp: 98.1 F (36.7 C)   SpO2: 95% 95%    General: Pt is alert, awake, not in acute distress Cardiovascular: RRR, S1/S2 + Respiratory: CTA bilaterally, no wheezing, no rhonchi, no respiratory distress, no conversational dyspnea  Abdominal: Soft, NT, ND, bowel sounds + Psych: Normal mood and affect, stable judgement, poor insight     The results of significant diagnostics from this hospitalization (including imaging, microbiology, ancillary and laboratory) are listed below for reference.     Microbiology: No results found for this or any previous visit (from the past 240 hour(s)).   Labs: BNP (last 3 results) Recent Labs    12/07/21 2125 11/06/22 0821 11/07/22 2332  BNP 1,347.1* 262.3* [id number]*   Basic Metabolic Panel: Recent Labs  Lab 11/06/22 0920 11/06/22 1539 11/07/22 0125 11/07/22 2332 11/09/22 0222 11/10/22 0249  NA 134*  --  133* 130* 130* 132*  K 3.3* 2.9* 3.9 3.9 4.1 3.4*  CL 97*  --  96* 97* 96* 94*  CO2 26  --  29 25 25 28   GLUCOSE 174*  --  188* 239* 271* 172*  BUN 12  --  13 15 14 12   CREATININE 1.10*  --  1.04* 1.10* 0.93 0.88  CALCIUM 8.2*  --  8.3* 8.2* 8.1* 8.4*   Liver Function Tests: Recent Labs  Lab 11/06/22 0920  AST 17  ALT 7  ALKPHOS 82  BILITOT 0.7  PROT 6.4*  ALBUMIN 2.7*   No results for input(s): "LIPASE", "AMYLASE" in the last 168 hours. No results for input(s): "AMMONIA" in the last 168 hours. CBC: Recent Labs  Lab 11/06/22 0821 11/06/22 0822 11/07/22 0125 11/07/22 2332 11/09/22 0222  WBC 5.7  --  5.7 7.7 8.6  NEUTROABS 4.0  --   --   --   --   HGB 11.6* 10.9* 9.7* 9.6* 9.8*  HCT 34.7* 32.0* 29.7* 28.6* 30.1*  MCV 95.1  --  94.6 93.8 96.2   PLT 262  --  231 227 198   Cardiac Enzymes: No results for input(s): "CKTOTAL", "CKMB", "CKMBINDEX", "TROPONINI" in the last 168 hours. BNP: Invalid input(s): "POCBNP" CBG: Recent Labs  Lab 11/09/22 1127 11/09/22 1633 11/09/22 2055 11/10/22 0759 11/10/22 1139  GLUCAP 213* 195* 195* 131* 166*   D-Dimer No results for input(s): "DDIMER" in the last 72 hours. Hgb A1c No results for input(s): "HGBA1C" in the last 72 hours. Lipid Profile No results for input(s): "CHOL", "HDL", "LDLCALC", "TRIG", "CHOLHDL", "LDLDIRECT" in the last 72 hours. Thyroid function studies No results for input(s): "TSH", "T4TOTAL", "T3FREE", "THYROIDAB" in the last 72 hours.  Invalid input(s): "FREET3" Anemia work up No results for input(s): "VITAMINB12", "FOLATE", "FERRITIN", "TIBC", "IRON", "RETICCTPCT" in the last 72 hours. Urinalysis    Component Value Date/Time   COLORURINE AMBER (A) 11/06/2022 1322   APPEARANCEUR CLEAR 11/06/2022 1322   LABSPEC 1.015 11/06/2022 1322   PHURINE 5.0 11/06/2022 1322   GLUCOSEU 50 (A) 11/06/2022 1322   GLUCOSEU 250 (A) 06/16/2022 1559   HGBUR NEGATIVE 11/06/2022 1322   BILIRUBINUR NEGATIVE 11/06/2022 1322   KETONESUR NEGATIVE 11/06/2022 1322   PROTEINUR NEGATIVE 11/06/2022 1322   UROBILINOGEN 0.2 06/16/2022 1559   NITRITE NEGATIVE 11/06/2022 1322   LEUKOCYTESUR NEGATIVE 11/06/2022 1322   Sepsis Labs Recent Labs  Lab 11/06/22 0821 11/07/22 0125 11/07/22 2332 11/09/22 0222  WBC 5.7 5.7 7.7 8.6   Microbiology No results found for this or any previous visit (from the past 240 hour(s)).   Patient was seen and examined on the day of discharge and was found to be in stable condition. Time coordinating discharge: 35 minutes including assessment and coordination of care, as well as examination of the patient.   SIGNED:  Dessa Phi, DO [facility] 11/10/2022, 1:07 PM

## 2022-11-10 NOTE — Progress Notes (Addendum)
Rounding Note    Patient Name: Katherine [name] Date of Encounter: 11/10/2022  [place] Cardiologist: [name], MD   Subjective   Feeling better this morning. Breathing has improved.   Inpatient Medications    Scheduled Meds:  apixaban  5 mg Oral BID   arformoterol  15 mcg Nebulization BID   azaTHIOprine  125 mg Oral Daily   budesonide  2 mL Nebulization BID   cariprazine  1.5 mg Oral Daily   clopidogrel  75 mg Oral Daily   fluticasone  1 spray Each Nare Daily   furosemide  40 mg Oral Daily   gabapentin  100 mg Oral QHS   insulin aspart  0-15 Units Subcutaneous TID WC   insulin aspart  0-5 Units Subcutaneous QHS   insulin aspart  4 Units Subcutaneous TID WC   insulin glargine-yfgn  30 Units Subcutaneous Daily   isosorbide mononitrate  90 mg Oral Daily   lamoTRIgine  25 mg Oral QHS   metoprolol tartrate  50 mg Oral BID   montelukast  10 mg Oral QHS   pantoprazole  40 mg Oral Daily   predniSONE  5 mg Oral Q breakfast   Ensure Max Protein  11 oz Oral BID   ranolazine  1,000 mg Oral BID   rosuvastatin  20 mg Oral QHS   sertraline  200 mg Oral Daily   Continuous Infusions:  PRN Meds: acetaminophen, albuterol, bisacodyl, cyclobenzaprine, LORazepam, metoprolol tartrate, nitroGLYCERIN, ondansetron (ZOFRAN) IV, oxyCODONE, promethazine   Vital Signs    Vitals:   11/09/22 2102 11/10/22 0500 11/10/22 0758 11/10/22 0816  BP:  123/71 125/68   Pulse:  97 95   Resp:  20 20   Temp:  97.9 F (36.6 C) 98.1 F (36.7 C)   TempSrc:  Axillary Oral   SpO2: 92% 90% 95% 95%  Weight:        Intake/Output Summary (Last 24 hours) at 11/10/2022 0925 Last data filed at 11/10/2022 [id number] Gross per 24 hour  Intake 480 ml  Output 1700 ml  Net -1220 ml      11/06/2022    3:00 PM 06/16/2022    2:55 PM 02/02/2022    2:36 PM  Last 3 Weights  Weight (lbs) 177 lb 4.8 oz 190 lb 192 lb  Weight (kg) 80.423 kg 86.183 kg 87.091 kg      Telemetry    Sinus Rhythm,  LBBB - Personally Reviewed  ECG    No new tracing  Physical Exam   GEN: No acute distress.   Neck: No JVD Cardiac: RRR, no murmurs, rubs, or gallops.  Respiratory: Clear to auscultation bilaterally. GI: Soft, nontender, non-distended  MS: No edema; No deformity. Neuro:  Nonfocal  Psych: Normal affect   Labs    High Sensitivity Troponin:   Recent Labs  Lab 11/06/22 0920 11/06/22 1539 11/06/22 1859  TROPONINIHS 27* 257* 428*     Chemistry Recent Labs  Lab 11/06/22 0920 11/06/22 1539 11/07/22 2332 11/09/22 0222 11/10/22 0249  NA 134*   < > 130* 130* 132*  K 3.3*   < > 3.9 4.1 3.4*  CL 97*   < > 97* 96* 94*  CO2 26   < > 25 25 28   GLUCOSE 174*   < > 239* 271* 172*  BUN 12   < > 15 14 12   CREATININE 1.10*   < > 1.10* 0.93 0.88  CALCIUM 8.2*   < > 8.2* 8.1* 8.4*  PROT  6.4*  --   --   --   --   ALBUMIN 2.7*  --   --   --   --   AST 17  --   --   --   --   ALT 7  --   --   --   --   ALKPHOS 82  --   --   --   --   BILITOT 0.7  --   --   --   --   GFRNONAA 56*   < > 56* >60 >60  ANIONGAP 11   < > 8 9 10    < > = values in this interval not displayed.    Lipids No results for input(s): "CHOL", "TRIG", "HDL", "LABVLDL", "LDLCALC", "CHOLHDL" in the last 168 hours.  Hematology Recent Labs  Lab 11/07/22 0125 11/07/22 2332 11/09/22 0222  WBC 5.7 7.7 8.6  RBC 3.14* 3.05* 3.13*  HGB 9.7* 9.6* 9.8*  HCT 29.7* 28.6* 30.1*  MCV 94.6 93.8 96.2  MCH 30.9 31.5 31.3  MCHC 32.7 33.6 32.6  RDW 17.3* 17.3* 17.9*  PLT 231 227 198   Thyroid  Recent Labs  Lab 11/06/22 1539  TSH 1.609    BNP Recent Labs  Lab 11/06/22 0821 11/07/22 2332  BNP 262.3* 785.2*    DDimer No results for input(s): "DDIMER" in the last 168 hours.   Radiology    ECHOCARDIOGRAM COMPLETE  Result Date: 11/09/2022    ECHOCARDIOGRAM REPORT   Patient Name:   PHALON KATSAROS Date of Exam: 11/09/2022 Medical Rec #:  [id number]          Height:       61.0 in Accession #:    [id number]          Weight:       177.3 lb Date of Birth:  October 20, 1955          BSA:          1.794 m Patient Age:    60 years           BP:           136/69 mmHg Patient Gender: F                  HR:           102 bpm. Exam Location:  Inpatient Procedure: 2D Echo, Cardiac Doppler and Color Doppler Indications:    Chest Pain R07.9  History:        Patient has prior history of Echocardiogram examinations, most                 recent 12/09/2021. CHF, CAD, PAD and COPD, Arrythmias:LBBB,                 Atrial Fibrillation, Tachycardia and Bradycardia,                 Signs/Symptoms:Chest Pain; Risk Factors:Hypertension, Diabetes,                 Dyslipidemia and Sleep Apnea. ESRD.  Sonographer:    Ronny Flurry Referring Phys: [id number] JENNIFER CHOI  Sonographer Comments: Image acquisition challenging due to uncooperative patient. IMPRESSIONS  1. Technically difficult study with limited visualization of cardiac structures.  2.     The LV is not well visualized. Overall EF seems to be in the 35-40% range with dyskinesis of the mid to distal inferoseptal wall and entire apex. The anterior wall is never imaged  well  3.     Right ventricular systolic function is normal. The right ventricular size is normal.  4. The mitral valve is normal in structure. Mild mitral valve regurgitation. No evidence of mitral stenosis. Moderate mitral annular calcification.  5. The aortic valve is moderately calcified with fusion of the right and left coronary cusps. The aortic valve is tricuspid. There is moderate calcification of the aortic valve. Aortic valve regurgitation is trivial. Mild aortic valve stenosis. Aortic valve area, by VTI measures 1.65 cm. Aortic valve mean gradient measures 9.0 mmHg. Aortic valve Vmax measures 2.13 m/s.  6. The inferior vena cava is normal in size with greater than 50% respiratory variability, suggesting right atrial pressure of 3 mmHg.  7. Left atrial size was moderately dilated.  8. Aortic dilatation noted. There is  borderline dilatation of the aortic root, measuring 38 mm. FINDINGS  Left Ventricle: The LV is not well visualized. Overall EF seems to be in the 35-40% range with dyskinesis of the mid to distal inferoseptal wall and entire apex. The anterior wall is never imaged well. Left ventricular ejection fraction, by estimation, is 35 to 40%. The left ventricle has moderately decreased function. The left ventricle demonstrates regional wall motion abnormalities. The left ventricular internal cavity size was normal in size. There is mild concentric left ventricular hypertrophy. Left ventricular diastolic parameters are indeterminate. Right Ventricle: The right ventricular size is normal. No increase in right ventricular wall thickness. Right ventricular systolic function is normal. Left Atrium: Left atrial size was moderately dilated. Right Atrium: Right atrial size was normal in size. Pericardium: There is no evidence of pericardial effusion. Mitral Valve: The mitral valve is normal in structure. Moderate mitral annular calcification. Mild mitral valve regurgitation. No evidence of mitral valve stenosis. MV peak gradient, 9.6 mmHg. The mean mitral valve gradient is 4.0 mmHg. Tricuspid Valve: The tricuspid valve is normal in structure. Tricuspid valve regurgitation is trivial. No evidence of tricuspid stenosis. Aortic Valve: The aortic valve is moderately calcified with fusion of the right and left coronary cusps. The aortic valve is tricuspid. There is moderate calcification of the aortic valve. Aortic valve regurgitation is trivial. Mild aortic stenosis is present. Aortic valve mean gradient measures 9.0 mmHg. Aortic valve peak gradient measures 18.1 mmHg. Aortic valve area, by VTI measures 1.65 cm. Pulmonic Valve: The pulmonic valve was normal in structure. Pulmonic valve regurgitation is not visualized. No evidence of pulmonic stenosis. Aorta: Aortic dilatation noted. There is borderline dilatation of the aortic root,  measuring 38 mm. Venous: The inferior vena cava is normal in size with greater than 50% respiratory variability, suggesting right atrial pressure of 3 mmHg. IAS/Shunts: No atrial level shunt detected by color flow Doppler.  LEFT VENTRICLE PLAX 2D LVIDd:         4.60 cm   Diastology LVIDs:         3.40 cm   LV e' lateral:   9.90 cm/s LV PW:         1.10 cm   LV E/e' lateral: 12.4 LV IVS:        1.10 cm LVOT diam:     2.20 cm LV SV:         55 LV SV Index:   31 LVOT Area:     3.80 cm  RIGHT VENTRICLE            IVC RV S prime:     7.71 cm/s  IVC diam: 1.50 cm TAPSE (M-mode): 1.5 cm  LEFT ATRIUM           Index        RIGHT ATRIUM           Index LA diam:      4.20 cm 2.34 cm/m   RA Area:     12.90 cm LA Vol (A4C): 87.3 ml 48.65 ml/m  RA Volume:   27.10 ml  15.10 ml/m  AORTIC VALVE AV Area (Vmax):    1.90 cm AV Area (Vmean):   1.81 cm AV Area (VTI):     1.65 cm AV Vmax:           212.67 cm/s AV Vmean:          135.667 cm/s AV VTI:            0.333 m AV Peak Grad:      18.1 mmHg AV Mean Grad:      9.0 mmHg LVOT Vmax:         106.25 cm/s LVOT Vmean:        64.600 cm/s LVOT VTI:          0.144 m LVOT/AV VTI ratio: 0.43  AORTA Ao Root diam: 3.30 cm Ao Asc diam:  3.75 cm MITRAL VALVE                TRICUSPID VALVE MV Area VTI:  1.91 cm      TR Peak grad:   25.4 mmHg MV Peak grad: 9.6 mmHg      TR Vmax:        252.00 cm/s MV Mean grad: 4.0 mmHg MV Vmax:      1.55 m/s      SHUNTS MV Vmean:     91.7 cm/s     Systemic VTI:  0.14 m MV E velocity: 123.00 cm/s  Systemic Diam: 2.20 cm [name] MD Electronically signed by [name] MD Signature Date/Time: 11/09/2022/5:04:46 PM    Final     Cardiac Studies   Echo: 11/09/2022  IMPRESSIONS     1. Technically difficult study with limited visualization of cardiac  structures.   2.      The LV is not well visualized. Overall EF seems to be in the 35-40%  range with dyskinesis of the mid to distal inferoseptal wall and entire  apex. The anterior wall is  never imaged well   3.      Right ventricular systolic function is normal. The right ventricular  size is normal.   4. The mitral valve is normal in structure. Mild mitral valve  regurgitation. No evidence of mitral stenosis. Moderate mitral annular  calcification.   5. The aortic valve is moderately calcified with fusion of the right and  left coronary cusps. The aortic valve is tricuspid. There is moderate  calcification of the aortic valve. Aortic valve regurgitation is trivial.  Mild aortic valve stenosis. Aortic  valve area, by VTI measures 1.65 cm. Aortic valve mean gradient measures  9.0 mmHg. Aortic valve Vmax measures 2.13 m/s.   6. The inferior vena cava is normal in size with greater than 50%  respiratory variability, suggesting right atrial pressure of 3 mmHg.   7. Left atrial size was moderately dilated.   8. Aortic dilatation noted. There is borderline dilatation of the aortic  root, measuring 38 mm.   FINDINGS   Left Ventricle: The LV is not well visualized. Overall EF seems to be in  the 35-40% range with dyskinesis of the mid to  distal inferoseptal wall  and entire apex. The anterior wall is never imaged well. Left ventricular  ejection fraction, by estimation,  is 35 to 40%. The left ventricle has moderately decreased function. The  left ventricle demonstrates regional wall motion abnormalities. The left  ventricular internal cavity size was normal in size. There is mild  concentric left ventricular hypertrophy.  Left ventricular diastolic parameters are indeterminate.   Right Ventricle: The right ventricular size is normal. No increase in  right ventricular wall thickness. Right ventricular systolic function is  normal.   Left Atrium: Left atrial size was moderately dilated.   Right Atrium: Right atrial size was normal in size.   Pericardium: There is no evidence of pericardial effusion.   Mitral Valve: The mitral valve is normal in structure. Moderate  mitral  annular calcification. Mild mitral valve regurgitation. No evidence of  mitral valve stenosis. MV peak gradient, 9.6 mmHg. The mean mitral valve  gradient is 4.0 mmHg.   Tricuspid Valve: The tricuspid valve is normal in structure. Tricuspid  valve regurgitation is trivial. No evidence of tricuspid stenosis.   Aortic Valve: The aortic valve is moderately calcified with fusion of the  right and left coronary cusps. The aortic valve is tricuspid. There is  moderate calcification of the aortic valve. Aortic valve regurgitation is  trivial. Mild aortic stenosis is  present. Aortic valve mean gradient measures 9.0 mmHg. Aortic valve peak  gradient measures 18.1 mmHg. Aortic valve area, by VTI measures 1.65 cm.   Pulmonic Valve: The pulmonic valve was normal in structure. Pulmonic valve  regurgitation is not visualized. No evidence of pulmonic stenosis.   Aorta: Aortic dilatation noted. There is borderline dilatation of the  aortic root, measuring 38 mm.   Venous: The inferior vena cava is normal in size with greater than 50%  respiratory variability, suggesting right atrial pressure of 3 mmHg.   IAS/Shunts: No atrial level shunt detected by color flow Doppler.    Patient Profile     67 y.o. female with a hx of CAD s/p CABG and multiple PCI's, oxygen dependent COPD, chronic diastolic heart failure, PAD, carotid artery disease, hypertension, hyperlipidemia, paroxysmal A-fib on Eliquis,type 2 DM, GERD, hx of ESRD s/p renal transplant 1991 on chronic immunosuppression meds, anxiety with depression, CVA, chronic anemia, OSA, torsades/QT prolongation (in setting of electrolyte disturbance, Tikosyn, Zoloft, phenergan), obesity s/p prior lap band who was seen 11/06/2022 for the evaluation of chest pain at the request of Dr. Truett Mainland.    Assessment & Plan    Chest pain CAD s/p CABG with PCI's -- presented with pain hsTn 27>>257>>428. Given poor vascular access, prior renal transplant and  improvement of symptoms, decision was made to treat medically. Increased stress with recent passing of her brother.  -- continue plavix, Ranexa, statin, metoprolol, Imdur  HTN -- well controlled -- continue metoprolol, add low dose Entresto  HLD -- continue Crestor 20mg  daily   HFrEF ICM -- echo showed decline in LVEF to 35-40% with dyskinesis of the mid to distal inferoseptal wall and entire apex, normal RV size and function. -- diuresed with IV lasix, net - 1.7L -- GDMT: continue metoprolol, add low dose Entresto and SGLT2, continue lasix   Paroxsymal Afib -- continue metoprolol, Eliquis 5mg  BID   DM -- SSI while inpatient -- added SGLT2  COPD Chronic respiratory failure -- on chronic O2 at 7L  For questions or updates, please contact [facility] Please consult [email or web address] for contact info under  Signed, Reino Bellis, NP  11/10/2022, 9:25 AM    Agree with note by Reino Bellis NP-C  Patient well-known to me.  History of remote CABG back in the mid 1990s and remote renal transplant in 1991.  She was admitted with chest pain and non-STEMI.  Her troponins peaked at 428.  The plan was to treat her medically.  She is on high flow oxygen for COPD with sats in the mid to high 80s.  She has had no further chest pain.  She did diurese with extra furosemide yesterday and I think it is reasonable to increase her home dose to 40 mg p.o. twice daily.  Renal function is stable.  Her basilar crackles are somewhat improved.  Okay for discharge from our point of view with close outpatient follow-up.  Lorretta Harp, M.D., [place], [facility], [place] [address] [place],   [address]  [phone] 11/10/2022 10:52 AM

## 2022-11-10 NOTE — Progress Notes (Signed)
Pt did not want to wear bipap at this time

## 2022-11-10 NOTE — TOC Progression Note (Addendum)
Transition of Care (TOC) - Progression Note   See previous TOC Team note .   Patient still wanting to go home at discharge.   Patient now requiring oxygen at 7 L [place] Continuous.   Discussed with Erasmo Downer with [facility].   Erasmo Downer requesting note stating :   Patient at rest on room air saturating ___ Patient at rest on 7L [place] saturating ____  Nurse aware.   Patient's concentrator at home can only support 5 L [place] , concentrators will need to changed out before patient can be discharged.   NCM discussed above with patient . Patient states there is no one who can met [employer] at her home today to exchange concentrators , however Shelby Dubin [phone] can tomorrow. NCM secure chatted team and provided contact to [facility] with [place]  Patient requesting [facility] transport home at [place]. NCM confirmed address.     She has active [employer] and her Medicaid is showing active through at least 12/15/22  Patient Details  Name: Katherine [name] MRN: [id number] Date of Birth: 1956/04/08  Transition of Care [facility]) CM/SW Contact  Chriss Redel, Edson Snowball, RN Phone Number: 11/10/2022, 2:07 PM  Clinical Narrative:       Expected Discharge Plan: [place] Barriers to Discharge: Continued Medical Work up ([place] with IV diuresis.)  Expected Discharge Plan and Services In-house Referral: NA Discharge Planning Services: CM Consult Post Acute Care Choice: Home Health Living arrangements for the past 2 months: [place] Expected Discharge Date: 11/10/22                 DME Agency: NA       HH Arranged: RN, Disease Management, PT [place] Agency: [place] Date [facility] Agency Contacted: 11/09/22 Time [place]: [id number] Representative spoke with at [place]: [place] Determinants of Health ([place]) Interventions SDOH Screenings   Food Insecurity: No Food Insecurity (07/20/2022)  Housing: Low Risk  (07/20/2022)  Transportation Needs: Unmet  Transportation Needs (11/07/2022)  Utilities: At Risk (07/20/2022)  Alcohol Screen: Low Risk  (12/10/2021)  Depression (PHQ2-9): Medium Risk (02/02/2022)  Tobacco Use: Medium Risk (11/06/2022)    Readmission Risk Interventions    11/09/2022    2:22 PM 12/12/2021   10:01 AM 09/23/2021    4:39 PM  Readmission Risk Prevention Plan  Transportation Screening Complete Complete Complete  Medication Review ([place]) Referral to Pharmacy Complete Complete  PCP or Specialist appointment within 3-5 days of discharge  Complete Complete  HRI or [place] Complete Complete Complete  SW Recovery Care/Counseling Consult Complete Complete Complete  Palliative Care Screening Not Applicable Not Applicable Complete  [place] Patient Refused Not Applicable Not Applicable

## 2022-11-10 NOTE — Evaluation (Signed)
Occupational Therapy Evaluation Patient Details Name: Katherine [name] MRN: [id number] DOB: Jun 30, 1956 Today's Date: 11/10/2022   History of Present Illness Pt is a 67 y/o F presenting to ED on 3/22 with chest pain and elevated troponin, chest pain resolved with nitroglycerin. Cardiology following pt. PMH includes chronic hypoxic respiratry failure on 7L O2 continuosly, COPD Gold Stage I, CAD s/p CABG and endgraft stenting, PAD, OSA, HLD, chronic combined HFrEF and HFpEF, PAF on Eliquis, ESRD s/p renal transplant, anxiety/depression, CVA.   Clinical Impression   Pt progressing towards goals, able to ambulate short distance in room with min guard A using RW, min A for UB dressing, and pt supervision for bed mobility. Pt able to complete seated therex at EOB. Pt on 7-10L O2 during session, SpO2 88% at lowest while mobilizing during session. Pt presenting with impairments listed below, will follow acutely. Continue to recommend postacute rehab (<3 hrs/day) at d/c to maximize safety and independence with ADLs/functional mobility prior to d/c home.       Recommendations for follow up therapy are one component of a multi-disciplinary discharge planning process, led by the attending physician.  Recommendations may be updated based on patient status, additional functional criteria and insurance authorization.   Assistance Recommended at Discharge Intermittent Supervision/Assistance  Patient can return home with the following A little help with walking and/or transfers;A lot of help with bathing/dressing/bathroom;Assistance with cooking/housework;Direct supervision/assist for medications management;Direct supervision/assist for financial management;Help with stairs or ramp for entrance;Assist for transportation    Functional Status Assessment     Equipment Recommendations  Wheelchair (measurements OT);Wheelchair cushion (measurements OT)    Recommendations for Other Services PT consult      Precautions / Restrictions Precautions Precautions: Fall Precaution Comments: watch O2 Restrictions Weight Bearing Restrictions: No      Mobility Bed Mobility Overal bed mobility: Needs Assistance Bed Mobility: Supine to Sit, Sit to Supine     Supine to sit: Supervision Sit to supine: Supervision        Transfers Overall transfer level: Needs assistance Equipment used: Rolling walker (2 wheels) Transfers: Sit to/from Stand Sit to Stand: Min guard                  Balance Overall balance assessment: Needs assistance Sitting-balance support: Feet supported Sitting balance-Leahy Scale: Good Sitting balance - Comments: sits statically at EOB   Standing balance support: Bilateral upper extremity supported Standing balance-Leahy Scale: Fair Standing balance comment: Reliant on RW                           ADL either performed or assessed with clinical judgement   ADL Overall ADL's : Needs assistance/impaired                 Upper Body Dressing : Minimal assistance;Sitting;Standing Upper Body Dressing Details (indicate cue type and reason): donning gown     Toilet Transfer: Ambulation;Regular Toilet;Rolling walker (2 wheels) Toilet Transfer Details (indicate cue type and reason): simulated via functional mobility         Functional mobility during ADLs: Min guard;Rolling walker (2 wheels)       Vision   Vision Assessment?: No apparent visual deficits     Perception     Praxis      Pertinent Vitals/Pain Pain Assessment Pain Assessment: No/denies pain     Hand Dominance     Extremity/Trunk Assessment Upper Extremity Assessment Upper Extremity Assessment: Generalized weakness   Lower Extremity  Assessment Lower Extremity Assessment: Defer to PT evaluation       Communication     Cognition Arousal/Alertness: Awake/alert Behavior During Therapy: WFL for tasks assessed/performed Overall Cognitive Status: Within Functional  Limits for tasks assessed                                       General Comments  SpO2 88% at lowest on 10L O2 during session    Exercises Exercises: General Upper Extremity, General Lower Extremity General Exercises - Upper Extremity Shoulder Flexion: AROM, Both, Seated, 10 reps Shoulder Extension: AROM, Both, 10 reps, Seated Elbow Flexion: AROM, Both, 10 reps, Seated Elbow Extension: AROM, Both, 10 reps, Seated General Exercises - Lower Extremity Long Arc Quad: AROM, Both, 10 reps, Seated Hip Flexion/Marching: AROM, Both, 10 reps, Seated Heel Raises: AROM, Both, 10 reps, Seated   Shoulder Instructions      Home Living                                          Prior Functioning/Environment                          OT Problem List:        OT Treatment/Interventions:      OT Goals(Current goals can be found in the care plan section) Acute Rehab OT Goals Patient Stated Goal: none stated OT Goal Formulation: With patient Time For Goal Achievement: 11/21/22 Potential to Achieve Goals: Fair ADL Goals Pt Will Perform Upper Body Dressing: with supervision;sitting Pt Will Perform Lower Body Dressing: with supervision;sitting/lateral leans;sit to/from stand Pt Will Transfer to Toilet: with supervision;regular height toilet;ambulating;bedside commode Additional ADL Goal #1: pt will verbalize 3 energy conservation strategies in prep for ADLs  OT Frequency: Min 2X/week    Co-evaluation              AM-PAC OT "6 Clicks" Daily Activity     Outcome Measure Help from another person eating meals?: None Help from another person taking care of personal grooming?: A Little Help from another person toileting, which includes using toliet, bedpan, or urinal?: A Lot Help from another person bathing (including washing, rinsing, drying)?: A Lot Help from another person to put on and taking off regular upper body clothing?: A Little Help from  another person to put on and taking off regular lower body clothing?: A Lot 6 Click Score: 16   End of Session Equipment Utilized During Treatment: Gait belt;Rolling walker (2 wheels);Oxygen (10L) Nurse Communication: Mobility status  Activity Tolerance: Patient tolerated treatment well Patient left: in bed;with call bell/phone within reach;with bed alarm set  OT Visit Diagnosis: Unsteadiness on feet (R26.81);Other abnormalities of gait and mobility (R26.89);Muscle weakness (generalized) (M62.81)                Time: [id number] OT Time Calculation (min): 34 min Charges:  OT General Charges $OT Visit: 1 Visit OT Treatments $Therapeutic Activity: 8-22 mins $Therapeutic Exercise: 8-22 mins  Renaye Rakers, OTD, OTR/L SecureChat Preferred Acute Rehab [phone]  Renaye Rakers [place] 11/10/2022, 4:08 PM

## 2022-11-11 ENCOUNTER — Encounter (HOSPITAL_COMMUNITY): Payer: Self-pay | Admitting: Emergency Medicine

## 2022-11-11 ENCOUNTER — Emergency Department (HOSPITAL_COMMUNITY): Payer: Medicare Other

## 2022-11-11 ENCOUNTER — Other Ambulatory Visit (HOSPITAL_COMMUNITY): Payer: Self-pay

## 2022-11-11 ENCOUNTER — Inpatient Hospital Stay (HOSPITAL_COMMUNITY)
Admission: EM | Admit: 2022-11-11 | Discharge: 2022-11-23 | DRG: 280 | Disposition: A | Discharge status: Skilled Nursing Facility | Payer: Medicare Other | Attending: Internal Medicine | Admitting: Internal Medicine

## 2022-11-11 DIAGNOSIS — F4321 Adjustment disorder with depressed mood: Secondary | ICD-10-CM | POA: Diagnosis present

## 2022-11-11 DIAGNOSIS — E1122 Type 2 diabetes mellitus with diabetic chronic kidney disease: Secondary | ICD-10-CM | POA: Diagnosis present

## 2022-11-11 DIAGNOSIS — Z794 Long term (current) use of insulin: Secondary | ICD-10-CM

## 2022-11-11 DIAGNOSIS — L89152 Pressure ulcer of sacral region, stage 2: Secondary | ICD-10-CM | POA: Diagnosis present

## 2022-11-11 DIAGNOSIS — I959 Hypotension, unspecified: Secondary | ICD-10-CM | POA: Insufficient documentation

## 2022-11-11 DIAGNOSIS — N189 Chronic kidney disease, unspecified: Secondary | ICD-10-CM | POA: Diagnosis present

## 2022-11-11 DIAGNOSIS — I5042 Chronic combined systolic (congestive) and diastolic (congestive) heart failure: Secondary | ICD-10-CM | POA: Diagnosis not present

## 2022-11-11 DIAGNOSIS — J449 Chronic obstructive pulmonary disease, unspecified: Secondary | ICD-10-CM | POA: Diagnosis present

## 2022-11-11 DIAGNOSIS — I2489 Other forms of acute ischemic heart disease: Secondary | ICD-10-CM | POA: Diagnosis present

## 2022-11-11 DIAGNOSIS — Z66 Do not resuscitate: Secondary | ICD-10-CM | POA: Diagnosis present

## 2022-11-11 DIAGNOSIS — I447 Left bundle-branch block, unspecified: Secondary | ICD-10-CM | POA: Diagnosis present

## 2022-11-11 DIAGNOSIS — Z8673 Personal history of transient ischemic attack (TIA), and cerebral infarction without residual deficits: Secondary | ICD-10-CM

## 2022-11-11 DIAGNOSIS — G4733 Obstructive sleep apnea (adult) (pediatric): Secondary | ICD-10-CM | POA: Diagnosis present

## 2022-11-11 DIAGNOSIS — K219 Gastro-esophageal reflux disease without esophagitis: Secondary | ICD-10-CM | POA: Diagnosis present

## 2022-11-11 DIAGNOSIS — E1169 Type 2 diabetes mellitus with other specified complication: Secondary | ICD-10-CM

## 2022-11-11 DIAGNOSIS — E785 Hyperlipidemia, unspecified: Secondary | ICD-10-CM | POA: Diagnosis present

## 2022-11-11 DIAGNOSIS — H547 Unspecified visual loss: Secondary | ICD-10-CM | POA: Diagnosis present

## 2022-11-11 DIAGNOSIS — N1831 Chronic kidney disease, stage 3a: Secondary | ICD-10-CM | POA: Diagnosis present

## 2022-11-11 DIAGNOSIS — Z7951 Long term (current) use of inhaled steroids: Secondary | ICD-10-CM

## 2022-11-11 DIAGNOSIS — R0602 Shortness of breath: Principal | ICD-10-CM

## 2022-11-11 DIAGNOSIS — M1711 Unilateral primary osteoarthritis, right knee: Secondary | ICD-10-CM

## 2022-11-11 DIAGNOSIS — T8619 Other complication of kidney transplant: Secondary | ICD-10-CM | POA: Diagnosis present

## 2022-11-11 DIAGNOSIS — F419 Anxiety disorder, unspecified: Secondary | ICD-10-CM | POA: Diagnosis present

## 2022-11-11 DIAGNOSIS — J438 Other emphysema: Secondary | ICD-10-CM

## 2022-11-11 DIAGNOSIS — J849 Interstitial pulmonary disease, unspecified: Secondary | ICD-10-CM | POA: Diagnosis present

## 2022-11-11 DIAGNOSIS — G8929 Other chronic pain: Secondary | ICD-10-CM | POA: Diagnosis present

## 2022-11-11 DIAGNOSIS — Z882 Allergy status to sulfonamides status: Secondary | ICD-10-CM

## 2022-11-11 DIAGNOSIS — Z7902 Long term (current) use of antithrombotics/antiplatelets: Secondary | ICD-10-CM

## 2022-11-11 DIAGNOSIS — Z888 Allergy status to other drugs, medicaments and biological substances status: Secondary | ICD-10-CM

## 2022-11-11 DIAGNOSIS — Z7952 Long term (current) use of systemic steroids: Secondary | ICD-10-CM

## 2022-11-11 DIAGNOSIS — I214 Non-ST elevation (NSTEMI) myocardial infarction: Secondary | ICD-10-CM | POA: Diagnosis present

## 2022-11-11 DIAGNOSIS — I25810 Atherosclerosis of coronary artery bypass graft(s) without angina pectoris: Secondary | ICD-10-CM | POA: Diagnosis present

## 2022-11-11 DIAGNOSIS — I2510 Atherosclerotic heart disease of native coronary artery without angina pectoris: Secondary | ICD-10-CM | POA: Diagnosis present

## 2022-11-11 DIAGNOSIS — F32A Depression, unspecified: Secondary | ICD-10-CM | POA: Diagnosis present

## 2022-11-11 DIAGNOSIS — M549 Dorsalgia, unspecified: Secondary | ICD-10-CM | POA: Diagnosis present

## 2022-11-11 DIAGNOSIS — N182 Chronic kidney disease, stage 2 (mild): Secondary | ICD-10-CM

## 2022-11-11 DIAGNOSIS — R0609 Other forms of dyspnea: Secondary | ICD-10-CM | POA: Diagnosis present

## 2022-11-11 DIAGNOSIS — Z885 Allergy status to narcotic agent status: Secondary | ICD-10-CM

## 2022-11-11 DIAGNOSIS — M47816 Spondylosis without myelopathy or radiculopathy, lumbar region: Secondary | ICD-10-CM

## 2022-11-11 DIAGNOSIS — Z9981 Dependence on supplemental oxygen: Secondary | ICD-10-CM

## 2022-11-11 DIAGNOSIS — I10 Essential (primary) hypertension: Secondary | ICD-10-CM

## 2022-11-11 DIAGNOSIS — I5043 Acute on chronic combined systolic (congestive) and diastolic (congestive) heart failure: Secondary | ICD-10-CM | POA: Diagnosis present

## 2022-11-11 DIAGNOSIS — I130 Hypertensive heart and chronic kidney disease with heart failure and stage 1 through stage 4 chronic kidney disease, or unspecified chronic kidney disease: Principal | ICD-10-CM | POA: Diagnosis present

## 2022-11-11 DIAGNOSIS — Z87891 Personal history of nicotine dependence: Secondary | ICD-10-CM

## 2022-11-11 DIAGNOSIS — N179 Acute kidney failure, unspecified: Secondary | ICD-10-CM | POA: Diagnosis present

## 2022-11-11 DIAGNOSIS — I480 Paroxysmal atrial fibrillation: Secondary | ICD-10-CM

## 2022-11-11 DIAGNOSIS — Z91048 Other nonmedicinal substance allergy status: Secondary | ICD-10-CM

## 2022-11-11 DIAGNOSIS — S41119A Laceration without foreign body of unspecified upper arm, initial encounter: Secondary | ICD-10-CM | POA: Diagnosis present

## 2022-11-11 DIAGNOSIS — E669 Obesity, unspecified: Secondary | ICD-10-CM

## 2022-11-11 DIAGNOSIS — E119 Type 2 diabetes mellitus without complications: Secondary | ICD-10-CM

## 2022-11-11 DIAGNOSIS — Z515 Encounter for palliative care: Secondary | ICD-10-CM

## 2022-11-11 DIAGNOSIS — J9621 Acute and chronic respiratory failure with hypoxia: Secondary | ICD-10-CM | POA: Diagnosis present

## 2022-11-11 DIAGNOSIS — J9611 Chronic respiratory failure with hypoxia: Secondary | ICD-10-CM | POA: Diagnosis present

## 2022-11-11 DIAGNOSIS — Z6838 Body mass index (BMI) 38.0-38.9, adult: Secondary | ICD-10-CM

## 2022-11-11 DIAGNOSIS — R0789 Other chest pain: Secondary | ICD-10-CM | POA: Diagnosis not present

## 2022-11-11 DIAGNOSIS — Z955 Presence of coronary angioplasty implant and graft: Secondary | ICD-10-CM

## 2022-11-11 DIAGNOSIS — M797 Fibromyalgia: Secondary | ICD-10-CM | POA: Diagnosis present

## 2022-11-11 DIAGNOSIS — Z7984 Long term (current) use of oral hypoglycemic drugs: Secondary | ICD-10-CM

## 2022-11-11 DIAGNOSIS — Z8249 Family history of ischemic heart disease and other diseases of the circulatory system: Secondary | ICD-10-CM

## 2022-11-11 DIAGNOSIS — I252 Old myocardial infarction: Secondary | ICD-10-CM

## 2022-11-11 DIAGNOSIS — M4726 Other spondylosis with radiculopathy, lumbar region: Secondary | ICD-10-CM

## 2022-11-11 DIAGNOSIS — E1151 Type 2 diabetes mellitus with diabetic peripheral angiopathy without gangrene: Secondary | ICD-10-CM | POA: Diagnosis present

## 2022-11-11 DIAGNOSIS — U099 Post covid-19 condition, unspecified: Secondary | ICD-10-CM | POA: Diagnosis present

## 2022-11-11 DIAGNOSIS — Z1152 Encounter for screening for COVID-19: Secondary | ICD-10-CM

## 2022-11-11 DIAGNOSIS — E1165 Type 2 diabetes mellitus with hyperglycemia: Secondary | ICD-10-CM | POA: Diagnosis not present

## 2022-11-11 DIAGNOSIS — Z7901 Long term (current) use of anticoagulants: Secondary | ICD-10-CM

## 2022-11-11 DIAGNOSIS — Y830 Surgical operation with transplant of whole organ as the cause of abnormal reaction of the patient, or of later complication, without mention of misadventure at the time of the procedure: Secondary | ICD-10-CM | POA: Diagnosis present

## 2022-11-11 DIAGNOSIS — F1120 Opioid dependence, uncomplicated: Secondary | ICD-10-CM | POA: Diagnosis present

## 2022-11-11 DIAGNOSIS — M069 Rheumatoid arthritis, unspecified: Secondary | ICD-10-CM | POA: Diagnosis present

## 2022-11-11 DIAGNOSIS — T8610 Unspecified complication of kidney transplant: Secondary | ICD-10-CM

## 2022-11-11 LAB — BASIC METABOLIC PANEL
Anion gap: 13 (ref 5–15)
BUN: 30 mg/dL — ABNORMAL HIGH (ref 8–23)
CO2: 27 mmol/L (ref 22–32)
Calcium: 8.9 mg/dL (ref 8.9–10.3)
Chloride: 91 mmol/L — ABNORMAL LOW (ref 98–111)
Creatinine, Ser: 1.48 mg/dL — ABNORMAL HIGH (ref 0.44–1.00)
GFR, Estimated: 39 mL/min — ABNORMAL LOW (ref >60)
Glucose, Bld: 218 mg/dL — ABNORMAL HIGH (ref 70–99)
Potassium: 4.6 mmol/L (ref 3.5–5.1)
Sodium: 131 mmol/L — ABNORMAL LOW (ref 135–145)

## 2022-11-11 LAB — TROPONIN I (HIGH SENSITIVITY): Troponin I (High Sensitivity): 228 ng/L (ref <18)

## 2022-11-11 LAB — CBC
HCT: 29.2 % — ABNORMAL LOW (ref 36.0–46.0)
Hemoglobin: 9.6 g/dL — ABNORMAL LOW (ref 12.0–15.0)
MCH: 31.8 pg (ref 26.0–34.0)
MCHC: 32.9 g/dL (ref 30.0–36.0)
MCV: 96.7 fL (ref 80.0–100.0)
Platelets: 208 10*3/uL (ref 150–400)
RBC: 3.02 MIL/uL — ABNORMAL LOW (ref 3.87–5.11)
RDW: 18.9 % — ABNORMAL HIGH (ref 11.5–15.5)
WBC: 7.1 10*3/uL (ref 4.0–10.5)
nRBC: 0.3 % — ABNORMAL HIGH (ref 0.0–0.2)

## 2022-11-11 LAB — GLUCOSE, CAPILLARY
Glucose-Capillary: 87 mg/dL (ref 70–99)
Glucose-Capillary: 272 mg/dL — ABNORMAL HIGH (ref 70–99)
Glucose-Capillary: 208 mg/dL — ABNORMAL HIGH (ref 70–99)

## 2022-11-11 LAB — BRAIN NATRIURETIC PEPTIDE: B Natriuretic Peptide: 396.5 pg/mL — ABNORMAL HIGH (ref 0.0–100.0)

## 2022-11-11 MED ORDER — ISOSORBIDE MONONITRATE ER 30 MG PO TB24
30.0000 mg | ORAL_TABLET | Freq: Every day | ORAL | 1 refills | Status: DC
  Filled 2022-11-11 (×2): qty 90, 90d supply, fill #0

## 2022-11-11 MED ORDER — ISOSORBIDE MONONITRATE ER 30 MG PO TB24
30.0000 mg | ORAL_TABLET | Freq: Every day | ORAL | Status: DC
  Administered 2022-11-11: 30 mg via ORAL
  Filled 2022-11-11: qty 1

## 2022-11-11 MED ORDER — OXYCODONE-ACETAMINOPHEN 5-325 MG PO TABS
1.0000 | ORAL_TABLET | Freq: Once | ORAL | Status: AC
  Administered 2022-11-11: 1 via ORAL
  Filled 2022-11-11: qty 1

## 2022-11-11 MED ORDER — LORAZEPAM 1 MG PO TABS
1.0000 mg | ORAL_TABLET | Freq: Once | ORAL | Status: AC
  Administered 2022-11-11: 1 mg via ORAL
  Filled 2022-11-11: qty 1

## 2022-11-11 NOTE — ED Provider Notes (Signed)
[facility] Provider Note   CSN: [id number] Arrival date & time: 11/11/22  2058     History  Chief Complaint  Patient presents with   Shortness of Breath    Katherine [name] is a 67 y.o. female with PMH significant for COPD, combined CHF, OSA, chronic hypoxic respiratory failure on chronic oxygen, 7 L at home, CAD, PAF, ESRD s/p renal transplant, CVA, CKD 3A who presents with shortness of breath, chest pain that began after discharge and attempt to ambulate into her home this evening. Patient was just admitted for chest pain, shortness of breath, she has had multiple stents, heart cath, last one having failed from arterial spasm. At time of evaluation she said she is chest pain, shortness of breath free. She had declined SNF placement after last admission but says she would be willing at this time.   Shortness of Breath      Home Medications Prior to Admission medications   Medication Sig Start Date End Date Taking? Authorizing Provider  albuterol (VENTOLIN HFA) 108 (90 Base) MCG/ACT inhaler TAKE 2 PUFFS BY MOUTH EVERY 6 HOURS AS NEEDED FOR WHEEZE OR SHORTNESS OF BREATH Patient taking differently: Inhale 2 puffs into the lungs every 6 (six) hours as needed for shortness of breath. 07/30/22   Collene Gobble, MD  arformoterol (BROVANA) 15 MCG/2ML NEBU Take 2 mLs (15 mcg total) by nebulization 2 (two) times daily. 08/30/19   Collene Gobble, MD  azaTHIOprine (IMURAN) 50 MG tablet Take 2 & 1/2 tablets (125 mg) by mouth daily at 3pm Patient taking differently: Take 125 mg by mouth daily. 10/21/21   Pokhrel, Corrie Mckusick, MD  bisacodyl (DULCOLAX) 5 MG EC tablet Take 1 tablet (5 mg total) by mouth daily as needed for moderate constipation. 10/05/21   Allie Bossier, MD  budesonide (PULMICORT) 0.5 MG/2ML nebulizer solution Take 2 mLs (0.5 mg total) by nebulization 2 (two) times daily. Patient taking differently: Take 2 mLs by nebulization 2 (two) times  daily as needed (wheezing). 08/24/19   [name], MD  calcitRIOL (ROCALTROL) 0.25 MCG capsule Take 0.25 mcg by mouth See admin instructions. Takes every 3 days.    [provider]  cariprazine (VRAYLAR) 1.5 MG capsule Take 1 capsule (1.5 mg total) by mouth daily. Patient not taking: Reported on 11/06/2022 06/16/22   [name], MD  clopidogrel (PLAVIX) 75 MG tablet Take 1 tablet (75 mg total) by mouth daily. 11/11/22 01/10/23  Dessa Phi, DO  Continuous Blood Gluc Receiver (FREESTYLE LIBRE 2 READER) DEVI 1 Act by Does not apply route daily. 06/17/22   [name], MD  Continuous Blood Gluc Sensor (FREESTYLE LIBRE 2 SENSOR) MISC 1 Act by Does not apply route daily. 06/17/22   [name], MD  [employer] VITAMIN C 500 MG tablet TAKE 1 TABLET (500 MG TOTAL) BY MOUTH DAILY. Patient taking differently: Take 500 mg by mouth daily. 02/03/22   [name], MD  cyclobenzaprine (FLEXERIL) 10 MG tablet TAKE 1 TABLET BY MOUTH TWICE A DAY AS NEEDED FOR MUSCLE SPASMS Patient taking differently: Take 10 mg by mouth daily as needed for muscle spasms. 09/14/22   [name], MD  ELIQUIS 5 MG TABS tablet TAKE 1 TABLET BY MOUTH TWICE A DAY 10/22/22   [name], MD  empagliflozin (JARDIANCE) 10 MG TABS tablet Take 1 tablet (10 mg total) by mouth daily. 11/11/22 01/10/23  Dessa Phi, DO  Ensure Max  Protein (ENSURE MAX PROTEIN) LIQD Take 330 mLs (11 oz total) by mouth 2 (two) times daily. 12/12/21   Mariel Aloe, MD  fluticasone (FLONASE) 50 MCG/ACT nasal spray PLACE 2 SPRAYS INTO BOTH NOSTRILS 2 TIMES DAILY. 08/04/22   [name], MD  furosemide (LASIX) 40 MG tablet Take 1 tablet (40 mg total) by mouth 2 (two) times daily. 11/10/22 01/09/23  Dessa Phi, DO  gabapentin (NEURONTIN) 100 MG capsule Take 1 capsule (100 mg total) by mouth at bedtime. Patient taking differently: Take 100 mg by mouth daily as needed (For pain). 09/11/21   Hosie Poisson, MD  glimepiride (AMARYL) 4 MG tablet  Take 4 mg by mouth daily with breakfast.    [provider]  insulin aspart (NOVOLOG FLEXPEN) 100 UNIT/ML FlexPen Inject 4-12 Units into the skin 3 (three) times daily with meals. Sliding scale.    [provider]  Insulin Glargine (BASAGLAR KWIKPEN) 100 UNIT/ML Inject 30 Units into the skin daily. Patient taking differently: Inject 12 Units into the skin daily. 06/17/22   [name], MD  Insulin Pen Needle (PEN NEEDLES) 32G X 4 MM MISC Use as needed to administer insulin. E11.9 02/01/22   [name], MD  isosorbide mononitrate (IMDUR) 30 MG 24 hr tablet Take 1 tablet (30 mg total) by mouth daily. 11/12/22   Mercy Riding, MD  KLOR-CON M10 10 MEQ tablet TAKE 1 TABLET BY MOUTH 2 TIMES DAILY. 09/19/22   [name], MD  lamoTRIgine (LAMICTAL) 25 MG tablet TAKE 1 TABLET BY MOUTH EVERYDAY AT BEDTIME Patient taking differently: Take 25 mg by mouth daily. 02/19/22   Meredith Staggers, MD  LORazepam (ATIVAN) 1 MG tablet Take 1 tablet (1 mg total) by mouth every 8 (eight) hours as needed for anxiety. 09/26/22   [name], MD  metFORMIN (GLUCOPHAGE) 500 MG tablet Take 500 mg by mouth 2 (two) times daily. 12/08/21   [provider]  metoprolol tartrate (LOPRESSOR) 50 MG tablet Take 1 tablet (50 mg total) by mouth 2 (two) times daily. 11/10/22 01/09/23  Dessa Phi, DO  montelukast (SINGULAIR) 10 MG tablet TAKE 1 TABLET BY MOUTH EVERYDAY AT BEDTIME Patient taking differently: Take 10 mg by mouth at bedtime. 05/04/22   Collene Gobble, MD  Multiple Vitamin (MULTIVITAMIN WITH MINERALS) TABS tablet Take 1 tablet by mouth daily. 12/13/21   Mariel Aloe, MD  nitroGLYCERIN (NITROSTAT) 0.4 MG SL tablet PLACE 1 TAB UNDER TONGUE EVERY 5MINS FOR CHEST PAIN Patient taking differently: Place 0.4 mg under the tongue every 5 (five) minutes as needed for chest pain. 06/30/22   [name], MD  Oxycodone HCl 10 MG TABS Take 1 tablet (10 mg total) by mouth every 8 (eight) hours as  needed. Patient taking differently: Take 10 mg by mouth every 8 (eight) hours as needed (For pain). 11/04/22   [name], MD  OXYGEN Inhale 4 L into the lungs continuous.    [provider]  pantoprazole (PROTONIX) 40 MG tablet TAKE 1 TABLET BY MOUTH EVERY DAY 08/05/22   [name], MD  predniSONE (DELTASONE) 5 MG tablet Take 5 mg by mouth daily with breakfast.    [provider]  promethazine (PHENERGAN) 25 MG tablet Take 1 tablet (25 mg total) by mouth every 6 (six) hours as needed for nausea or vomiting. 05/20/22   Haydee Salter, MD  ranolazine (RANEXA) 1000 MG SR tablet TAKE 1 TABLET BY MOUTH TWICE A  DAY 10/31/22   [name], MD  rosuvastatin (CRESTOR) 20 MG tablet TAKE 1 TABLET BY MOUTH EVERY DAY 07/29/22   [name], MD  sertraline (ZOLOFT) 100 MG tablet Take 2 tablets (200 mg total) by mouth daily. 06/16/22   [name], MD      Allergies    Tetracycline, Niacin, Niaspan [niacin er], Sulfa antibiotics, Sulfonamide derivatives, Codeine, Erythromycin, Hydromorphone hcl, Morphine and related, Nalbuphine, Sulfasalazine, and Tape    Review of Systems   Review of Systems  Respiratory:  Positive for shortness of breath.   All other systems reviewed and are negative.   Physical Exam Updated Vital Signs BP 113/74 (BP Location: Right Arm)   Pulse 97   Temp 98.3 F (36.8 C) (Oral)   Resp 20   SpO2 92%  Physical Exam Vitals and nursing note reviewed.  Constitutional:      General: She is not in acute distress.    Appearance: Normal appearance.  HENT:     Head: Normocephalic and atraumatic.  Eyes:     General:        Right eye: No discharge.        Left eye: No discharge.  Cardiovascular:     Rate and Rhythm: Normal rate and regular rhythm.     Heart sounds: No murmur heard.    No friction rub. No gallop.  Pulmonary:     Effort: Pulmonary effort is normal.     Breath sounds: Normal breath sounds.     Comments: No significant  wheezing, rhonchi, rales, no acute respiratory distress at time of my evaluation Abdominal:     General: Bowel sounds are normal.     Palpations: Abdomen is soft.  Skin:    General: Skin is warm and dry.     Capillary Refill: Capillary refill takes less than 2 seconds.  Neurological:     Mental Status: She is alert and oriented to person, place, and time.  Psychiatric:        Mood and Affect: Mood normal.        Behavior: Behavior normal.     ED Results / Procedures / Treatments   Labs (all labs ordered are listed, but only abnormal results are displayed) Labs Reviewed  BASIC METABOLIC PANEL  CBC  TROPONIN I (HIGH SENSITIVITY)    EKG None  Radiology No results found.  Procedures Procedures    Medications Ordered in ED Medications - No data to display  ED Course/ Medical Decision Making/ A&P                             Medical Decision Making Amount and/or Complexity of Data Reviewed Labs: ordered. Radiology: ordered.   This patient is a 67 y.o. female  who presents to the ED for concern of chest pain, shortness of breath.   Differential diagnoses prior to evaluation: The emergent differential diagnosis includes, but is not limited to,  asthma exacerbation, COPD exacerbation, acute upper respiratory infection, acute bronchitis, chronic bronchitis, interstitial lung disease, ARDS, PE, pneumonia, ACS, carbon monoxide poisoning, spontaneous pneumothorax, new CHF vs CHF exacerbation, versus other . This is not an exhaustive differential.   Past Medical History / Co-morbidities: COPD, combined CHF, OSA, chronic hypoxic respiratory failure on chronic oxygen, 7 L at home, CAD, PAF, ESRD s/p renal transplant, CVA, CKD 3A   Additional history: Chart reviewed. Pertinent results include: reviewed labwork, imaging from recent admission, discharge today.  Reviewed significant previous history of cardiac catheterizations, echos, last two catheterizations had failed due to  technical challenge, most recent EF in hospital with EF of 35-40%  Physical Exam: Physical exam performed. The pertinent findings include: On arrival patient is overall well-appearing with stable vital signs, she has a stable oxygen saturation with good pleth on her home 7 L nasal cannula  Lab Tests/Imaging studies: I personally interpreted labs/imaging and the pertinent results include: BMP with mild hyponatremia, her glucose is elevated at 218, but was still be hyponatremic even after correction, creatinine 1.48, BUN 30, AKI from recent hospitalization, anemia stable from recent admission. BNP down from last admission at 396.5. Initial troponin elevated at 228, pending delta at this time. CXR with cardiomegaly, interstitial congestion consistent with heart failure, but improved I agree with the radiologist interpretation.  Cardiac monitoring: EKG obtained and interpreted by my attending physician which shows: NSR LBBB  Requested consult with cardiology, spoke with Dr. Vickki Muff who requests delta troponin and repeat consult if worsening or return of chest pain.   Medications: I ordered medication including oxycodone for pain, Ativan for anxiety, patient received nitro, aspirin prior to arrival.  She has not needed any treatment for shortness of breath, she was briefly on nonrebreather but was back to her home oxygen by time of ED evaluation..  I have reviewed the patients home medicines and have made adjustments as needed.  Plan at this time is pending delta troponin if stable, or more elevated I think the patient would benefit from readmission given return of chest pain, shortness of breath, with symptoms consistent with NSTEMI versus unstable angina in context of known severe coronary artery disease, if no acute cardiac procedure warranted, patient is now amenable to SNF placement and would benefit greatly from skilled nursing care  11:24 PM Care of Katherine [name] transferred to [name] and Dr. Dayna Barker at the end of my shift as the patient will require reassessment once labs/imaging have resulted. Patient presentation, ED course, and plan of care discussed with review of all pertinent labs and imaging. Please see his/her note for further details regarding further ED course and disposition. Plan at time of handoff is pending delta troponin patient may require reconsult cardiology, otherwise anticipate admission for mild AKI, with plan for patient to have discharge to SNF. This may be altered or completely changed at the discretion of the oncoming team pending results of further workup.   Final Clinical Impression(s) / ED Diagnoses Final diagnoses:  None    Rx / DC Orders ED Discharge Orders     None         Dorien Chihuahua 11/11/22 2325    Fransico Meadow, MD 11/12/22 1241

## 2022-11-11 NOTE — ED Triage Notes (Addendum)
WHen she got dropped off at home from discharge today, she became SOB and had chest pain. PTAR called EMS when she got home since she was complaining of chest pain. She states she was forced to walk up her stairs by [facility] and began to sweat and have chest pain. She is no chest pain free. Took 1 nitro and aspirin given by EMS.

## 2022-11-11 NOTE — Discharge Summary (Signed)
Physician Discharge Summary  Katherine [name] [id number] DOB: 1955-09-01 DOA: 11/06/2022  PCP: [name], MD  Admit date: 11/06/2022 Discharge date: 11/11/2022 Admitted From: Home. Disposition: Home.  Patient refused SNF. Recommendations for Outpatient Follow-up:  Follow up with PCP and cardiology in 1 to 2 weeks. Check BP, CMP and CBC at follow-up Please follow up on the following pending results: None  Home [name]: PT/OT/RN/aide/CSW Equipment/Devices: Patient has a home oxygen and other DME.  Discharge Condition: Stable but guarded prognosis CODE STATUS: DNR/DNI  Follow-up Information     [name], Katherine Follow up.   Specialty: Home [name] Services Why: Physical Therapy and Registered Nurse-office to call with visit times. Contact information: [address] [name] [address] [phone]         [name], MD Follow up.   Specialty: Internal Medicine Contact information: [place] [address] [phone]         [name], NP. Go on 11/20/2022.   Specialties: Cardiology, Family Medicine Contact information: [address] [place] [address] [phone]                 Hospital course 67 year old F with PMH of COPD, combined CHF, OSA, chronic hypoxic RF on 7 L, CAD, PAF, ESRD s/p renal transplant, CKD-3A, CVA, debility, chronic pain, anxiety and depression presenting with vague chest pain with increased SOB and palpitation and admitted for atypical chest pain.  In ED, vital stable.  Saturating in mid 90s on 5 to 6 L.  VBG suggests respiratory alkalosis.  CMP without significant finding other than mildly elevated creatinine.  BNP 262.  Troponin 27 and trended up to 428.  Cardiology consulted.  TTE with LVEF of 35 to 40%, dyskinesis of mid to distal inferior septal wall and entire apex. Patient was diuresed with IV Lasix and cleared for discharge by cardiology on p.o. Lasix 40 mg  twice daily.  [name] was initially added but discontinued on discharge due to soft blood pressures.  Imdur was reduced to 30 mg daily as well.  Therapy recommended SNF but patient chose to go home with home [name] and DME.   See individual problem list below for more.   Problems addressed during this hospitalization Principal Problem:   Chest pain Active Problems:   Renal transplant disorder   CAD (coronary artery disease) of bypass graft   Essential hypertension   Paroxysmal atrial fibrillation (HCC); CHA2DS2VASc score F, HTN, CAD, CVA = 5   Type 2 diabetes mellitus with hyperlipidemia (HCC)   LBBB (left bundle branch block)   OSA (obstructive sleep apnea)   Current chronic use of systemic steroids   Chronic respiratory failure with hypoxia (HCC)   COPD (chronic obstructive pulmonary disease) ([place])   History of renal transplant   Obesity (BMI 30-39.9)   Chronic combined systolic and diastolic heart failure (HCC)   CHF exacerbation (HCC)   Insulin-requiring or dependent type II diabetes mellitus (HCC)   Pressure injury of skin   Atypical chest pain/history of CAD s/p CABG with multiple stenting: -Continue Plavix, ranolazine, Imdur, Crestor, metoprolol  -Outpatient follow-up with cardiology -Cardiology signed off.   Acute on chronic combined systolic diastolic heart failure: TTE as above.  Diuresed with IV Lasix.  Net -2.3 L.  Soft BPs.  Creatinine improved. -Discharge on p.o. Lasix 40 mg twice daily -GDMT-Toprol, Jardiance and Imdur.  Discontinue Entresto due to hypotension. -Reassess fluid status, BP, renal functions and  electrolytes at follow-up.   Chronic hypoxemic respiratory failure: States using several liters at baseline. -Continue home oxygen.  Paroxysmal A-fib -Metoprolol, Eliquis  Left arm lesion -Has been ongoing over the past several months, has outpatient surgery referral for removal   Debility -Therapy recommended SNF but patient chose to go home with  home [name]   Anxiety and depression: Poorly controlled. -Vraylar, Lamictal, Ativan, Zoloft   CKD stage IIIa, status post kidney transplant -Imuran, prednisone   COPD -Breathing treatments and inhalers   Insulin-dependent diabetes -Semglee, sliding scale insulin.  Add mealtime NovoLog today   Chronic back pain, narcotic dependent -Oxycodone, Flexeril  Hypotension: Multifactorial including meds, immobility, CHF -Decrease Imdur to 30 mg daily -Discontinued Entresto -Continue Toprol-XL and Lasix   Hypokalemia -Replace   Pressure skin injury: POA Pressure Injury 11/06/22 Sacrum Mid;Lower Stage 2 -  Partial thickness loss of dermis presenting as a shallow open injury with a red, pink wound bed without slough. (Active)  11/06/22 2000  Location: Sacrum  Location Orientation: Mid;Lower  Staging: Stage 2 -  Partial thickness loss of dermis presenting as a shallow open injury with a red, pink wound bed without slough.  Wound Description (Comments):   Present on Admission: Yes  Dressing Type Foam - Lift dressing to assess site every shift (pt refused to turn for assessment) 11/10/22 2100    Time spent 45  Vital signs Vitals:   11/11/22 0736 11/11/22 0737 11/11/22 0845 11/11/22 1118  BP:   (!) 102/44 96/67  Pulse:   91 92  Temp:   97.9 F (36.6 C)   Resp:   16   Weight:      SpO2: 95% 95% 97% 95%  TempSrc:   Oral      Discharge exam  GENERAL: No apparent distress.  Nontoxic. HEENT: MMM.  Vision and hearing grossly intact.  NECK: Supple.  No apparent JVD.  RESP:  No IWOB.  Fair aeration bilaterally. CVS:  RRR. Heart sounds normal.  ABD/GI/GU: BS+. Abd soft, NTND.  MSK/EXT:  Moves extremities. No apparent deformity. No edema.  SKIN: no apparent skin lesion or wound NEURO: Awake and alert. Oriented appropriately.  No apparent focal neuro deficit. PSYCH: Appears anxious.  Discharge Instructions Discharge Instructions     (HEART FAILURE PATIENTS) Call MD:  Anytime  you have any of the following symptoms: 1) 3 pound weight gain in 24 hours or 5 pounds in 1 week 2) shortness of breath, with or without a dry hacking cough 3) swelling in the hands, feet or stomach 4) if you have to sleep on extra pillows at night in order to breathe.   Complete by: As directed    Call MD for:  difficulty breathing, headache or visual disturbances   Complete by: As directed    Call MD for:  extreme fatigue   Complete by: As directed    Call MD for:  persistant dizziness or light-headedness   Complete by: As directed    Call MD for:  persistant nausea and vomiting   Complete by: As directed    Call MD for:  severe uncontrolled pain   Complete by: As directed    Call MD for:  temperature >100.4   Complete by: As directed    Diet - low sodium heart healthy   Complete by: As directed    Discharge instructions   Complete by: As directed    You were cared for by a hospitalist during your hospital stay. If you have any  questions about your discharge medications or the care you received while you were in the hospital after you are discharged, you can call the unit and ask to speak with the hospitalist on call if the hospitalist that took care of you is not available. Once you are discharged, your primary care physician will handle any further medical issues. Please note that NO REFILLS for any discharge medications will be authorized once you are discharged, as it is imperative that you return to your primary care physician (or establish a relationship with a primary care physician if you do not have one) for your aftercare needs so that they can reassess your need for medications and monitor your lab values.   Increase activity slowly   Complete by: As directed    No wound care   Complete by: As directed       Allergies as of 11/11/2022       Reactions   Tetracycline Hives   Patient tolerated Doxycycline Dec 2020   Niacin Other (See Comments)   Mouth blisters   Niaspan  [niacin Er] Other (See Comments)   Mouth blisters   Sulfa Antibiotics Nausea Only, Other (See Comments)   "Tears up stomach"   Sulfonamide Derivatives Other (See Comments)   Reaction: per patient "tears her stomach up"   Codeine Nausea And Vomiting   Erythromycin Nausea And Vomiting   Hydromorphone Hcl Nausea And Vomiting   Morphine And Related Nausea And Vomiting   Nalbuphine Nausea And Vomiting   Nubain   Sulfasalazine Nausea Only, Other (See Comments)   per patient "tears her stomach up", "Tears up stomach"   Tape Rash, Other (See Comments)   No "plastic" tape," please----cloth tape only        Medication List     STOP taking these medications    amoxicillin-clavulanate 875-125 MG tablet Commonly known as: AUGMENTIN       TAKE these medications    albuterol 108 (90 Base) MCG/ACT inhaler Commonly known as: Ventolin HFA TAKE 2 PUFFS BY MOUTH EVERY 6 HOURS AS NEEDED FOR WHEEZE OR SHORTNESS OF BREATH What changed:  how much to take how to take this when to take this reasons to take this additional instructions   arformoterol 15 MCG/2ML Nebu Commonly known as: BROVANA Take 2 mLs (15 mcg total) by nebulization 2 (two) times daily.   azaTHIOprine 50 MG tablet Commonly known as: IMURAN Take 2 & 1/2 tablets (125 mg) by mouth daily at 3pm What changed: when to take this   Basaglar KwikPen 100 UNIT/ML Inject 30 Units into the skin daily. What changed: how much to take   bisacodyl 5 MG EC tablet Commonly known as: DULCOLAX Take 1 tablet (5 mg total) by mouth daily as needed for moderate constipation.   budesonide 0.5 MG/2ML nebulizer solution Commonly known as: PULMICORT Take 2 mLs (0.5 mg total) by nebulization 2 (two) times daily. What changed:  when to take this reasons to take this   calcitRIOL 0.25 MCG capsule Commonly known as: ROCALTROL Take 0.25 mcg by mouth See admin instructions. Takes every 3 days.   cariprazine 1.5 MG capsule Commonly known  as: Vraylar Take 1 capsule (1.5 mg total) by mouth daily.   clopidogrel 75 MG tablet Commonly known as: PLAVIX Take 1 tablet (75 mg total) by mouth daily.   [employer] Vitamin C 500 MG tablet Generic drug: ascorbic acid TAKE 1 TABLET (500 MG TOTAL) BY MOUTH DAILY. What changed: how much to take  cyclobenzaprine 10 MG tablet Commonly known as: FLEXERIL TAKE 1 TABLET BY MOUTH TWICE A DAY AS NEEDED FOR MUSCLE SPASMS What changed: See the new instructions.   Eliquis 5 MG Tabs tablet Generic drug: apixaban TAKE 1 TABLET BY MOUTH TWICE A DAY   Ensure Max Protein Liqd Take 330 mLs (11 oz total) by mouth 2 (two) times daily.   fluticasone 50 MCG/ACT nasal spray Commonly known as: FLONASE PLACE 2 SPRAYS INTO BOTH NOSTRILS 2 TIMES DAILY.   FreeStyle Libre 2 Reader Benoit 1 Act by Does not apply route daily.   FreeStyle Libre 2 Sensor Misc 1 Act by Does not apply route daily.   furosemide 40 MG tablet Commonly known as: LASIX Take 1 tablet (40 mg total) by mouth 2 (two) times daily. What changed: when to take this   gabapentin 100 MG capsule Commonly known as: NEURONTIN Take 1 capsule (100 mg total) by mouth at bedtime. What changed:  when to take this reasons to take this   glimepiride 4 MG tablet Commonly known as: AMARYL Take 4 mg by mouth daily with breakfast.   isosorbide mononitrate 30 MG 24 hr tablet Commonly known as: IMDUR Take 1 tablet (30 mg total) by mouth daily. Start taking on: November 12, 2022 What changed:  medication strength how much to take   Jardiance 10 MG Tabs tablet Generic drug: empagliflozin Take 1 tablet (10 mg total) by mouth daily.   Klor-Con M10 10 MEQ tablet Generic drug: potassium chloride TAKE 1 TABLET BY MOUTH 2 TIMES DAILY.   lamoTRIgine 25 MG tablet Commonly known as: LAMICTAL TAKE 1 TABLET BY MOUTH EVERYDAY AT BEDTIME What changed: See the new instructions.   LORazepam 1 MG tablet Commonly known as: ATIVAN Take 1 tablet (1 mg  total) by mouth every 8 (eight) hours as needed for anxiety.   metFORMIN 500 MG tablet Commonly known as: GLUCOPHAGE Take 500 mg by mouth 2 (two) times daily.   metoprolol tartrate 50 MG tablet Commonly known as: LOPRESSOR Take 1 tablet (50 mg total) by mouth 2 (two) times daily. What changed:  medication strength how much to take when to take this   montelukast 10 MG tablet Commonly known as: SINGULAIR TAKE 1 TABLET BY MOUTH EVERYDAY AT BEDTIME What changed: See the new instructions.   multivitamin with minerals Tabs tablet Take 1 tablet by mouth daily.   nitroGLYCERIN 0.4 MG SL tablet Commonly known as: NITROSTAT PLACE 1 TAB UNDER TONGUE EVERY 5MINS FOR CHEST PAIN What changed:  how much to take how to take this when to take this reasons to take this additional instructions   NovoLOG FlexPen 100 UNIT/ML FlexPen Generic drug: insulin aspart Inject 4-12 Units into the skin 3 (three) times daily with meals. Sliding scale.   Oxycodone HCl 10 MG Tabs Take 1 tablet (10 mg total) by mouth every 8 (eight) hours as needed. What changed: reasons to take this   OXYGEN Inhale 4 L into the lungs continuous.   pantoprazole 40 MG tablet Commonly known as: PROTONIX TAKE 1 TABLET BY MOUTH EVERY DAY   Pen Needles 32G X 4 MM Misc Use as needed to administer insulin. E11.9   predniSONE 5 MG tablet Commonly known as: DELTASONE Take 5 mg by mouth daily with breakfast.   promethazine 25 MG tablet Commonly known as: PHENERGAN Take 1 tablet (25 mg total) by mouth every 6 (six) hours as needed for nausea or vomiting.   ranolazine 1000 MG SR tablet Commonly known  as: RANEXA TAKE 1 TABLET BY MOUTH TWICE A DAY   rosuvastatin 20 MG tablet Commonly known as: CRESTOR TAKE 1 TABLET BY MOUTH EVERY DAY   sertraline 100 MG tablet Commonly known as: ZOLOFT Take 2 tablets (200 mg total) by mouth daily.               Durable Medical Equipment  (From admission, onward)            Start     Ordered   11/10/22 1244  For home use only DME oxygen  Once       Question Answer Comment  Length of Need Lifetime   Mode or (Route) Nasal cannula   Liters per Minute 7   Frequency Continuous (stationary and portable oxygen unit needed)   Oxygen conserving device Yes   Oxygen delivery system Gas      11/10/22 1243            Consultations: Cardiology  Procedures/Studies:   ECHOCARDIOGRAM COMPLETE  Result Date: 11/09/2022    ECHOCARDIOGRAM REPORT   Patient Name:   Katherine [name] Date of Exam: 11/09/2022 Medical Rec #:  [id number]          Height:       61.0 in Accession #:    [id number]         Weight:       177.3 lb Date of Birth:  27-May-1956          BSA:          1.794 m Patient Age:    44 years           BP:           136/69 mmHg Patient Gender: F                  HR:           102 bpm. Exam Location:  Inpatient Procedure: 2D Echo, Cardiac Doppler and Color Doppler Indications:    Chest Pain R07.9  History:        Patient has prior history of Echocardiogram examinations, most                 recent 12/09/2021. CHF, CAD, PAD and COPD, Arrythmias:LBBB,                 Atrial Fibrillation, Tachycardia and Bradycardia,                 Signs/Symptoms:Chest Pain; Risk Factors:Hypertension, Diabetes,                 Dyslipidemia and Sleep Apnea. ESRD.  Sonographer:    Ronny Flurry Referring Phys: [id number] JENNIFER CHOI  Sonographer Comments: Image acquisition challenging due to uncooperative patient. IMPRESSIONS  1. Technically difficult study with limited visualization of cardiac structures.  2.     The LV is not well visualized. Overall EF seems to be in the 35-40% range with dyskinesis of the mid to distal inferoseptal wall and entire apex. The anterior wall is never imaged well  3.     Right ventricular systolic function is normal. The right ventricular size is normal.  4. The mitral valve is normal in structure. Mild mitral valve regurgitation. No evidence of  mitral stenosis. Moderate mitral annular calcification.  5. The aortic valve is moderately calcified with fusion of the right and left coronary cusps. The aortic valve is tricuspid. There is moderate calcification of the aortic valve. Aortic valve regurgitation  is trivial. Mild aortic valve stenosis. Aortic valve area, by VTI measures 1.65 cm. Aortic valve mean gradient measures 9.0 mmHg. Aortic valve Vmax measures 2.13 m/s.  6. The inferior vena cava is normal in size with greater than 50% respiratory variability, suggesting right atrial pressure of 3 mmHg.  7. Left atrial size was moderately dilated.  8. Aortic dilatation noted. There is borderline dilatation of the aortic root, measuring 38 mm. FINDINGS  Left Ventricle: The LV is not well visualized. Overall EF seems to be in the 35-40% range with dyskinesis of the mid to distal inferoseptal wall and entire apex. The anterior wall is never imaged well. Left ventricular ejection fraction, by estimation, is 35 to 40%. The left ventricle has moderately decreased function. The left ventricle demonstrates regional wall motion abnormalities. The left ventricular internal cavity size was normal in size. There is mild concentric left ventricular hypertrophy. Left ventricular diastolic parameters are indeterminate. Right Ventricle: The right ventricular size is normal. No increase in right ventricular wall thickness. Right ventricular systolic function is normal. Left Atrium: Left atrial size was moderately dilated. Right Atrium: Right atrial size was normal in size. Pericardium: There is no evidence of pericardial effusion. Mitral Valve: The mitral valve is normal in structure. Moderate mitral annular calcification. Mild mitral valve regurgitation. No evidence of mitral valve stenosis. MV peak gradient, 9.6 mmHg. The mean mitral valve gradient is 4.0 mmHg. Tricuspid Valve: The tricuspid valve is normal in structure. Tricuspid valve regurgitation is trivial. No evidence  of tricuspid stenosis. Aortic Valve: The aortic valve is moderately calcified with fusion of the right and left coronary cusps. The aortic valve is tricuspid. There is moderate calcification of the aortic valve. Aortic valve regurgitation is trivial. Mild aortic stenosis is present. Aortic valve mean gradient measures 9.0 mmHg. Aortic valve peak gradient measures 18.1 mmHg. Aortic valve area, by VTI measures 1.65 cm. Pulmonic Valve: The pulmonic valve was normal in structure. Pulmonic valve regurgitation is not visualized. No evidence of pulmonic stenosis. Aorta: Aortic dilatation noted. There is borderline dilatation of the aortic root, measuring 38 mm. Venous: The inferior vena cava is normal in size with greater than 50% respiratory variability, suggesting right atrial pressure of 3 mmHg. IAS/Shunts: No atrial level shunt detected by color flow Doppler.  LEFT VENTRICLE PLAX 2D LVIDd:         4.60 cm   Diastology LVIDs:         3.40 cm   LV e' lateral:   9.90 cm/s LV PW:         1.10 cm   LV E/e' lateral: 12.4 LV IVS:        1.10 cm LVOT diam:     2.20 cm LV SV:         55 LV SV Index:   31 LVOT Area:     3.80 cm  RIGHT VENTRICLE            IVC RV S prime:     7.71 cm/s  IVC diam: 1.50 cm TAPSE (M-mode): 1.5 cm LEFT ATRIUM           Index        RIGHT ATRIUM           Index LA diam:      4.20 cm 2.34 cm/m   RA Area:     12.90 cm LA Vol (A4C): 87.3 ml 48.65 ml/m  RA Volume:   27.10 ml  15.10 ml/m  AORTIC VALVE AV  Area (Vmax):    1.90 cm AV Area (Vmean):   1.81 cm AV Area (VTI):     1.65 cm AV Vmax:           212.67 cm/s AV Vmean:          135.667 cm/s AV VTI:            0.333 m AV Peak Grad:      18.1 mmHg AV Mean Grad:      9.0 mmHg LVOT Vmax:         106.25 cm/s LVOT Vmean:        64.600 cm/s LVOT VTI:          0.144 m LVOT/AV VTI ratio: 0.43  AORTA Ao Root diam: 3.30 cm Ao Asc diam:  3.75 cm MITRAL VALVE                TRICUSPID VALVE MV Area VTI:  1.91 cm      TR Peak grad:   25.4 mmHg MV Peak grad:  9.6 mmHg      TR Vmax:        252.00 cm/s MV Mean grad: 4.0 mmHg MV Vmax:      1.55 m/s      SHUNTS MV Vmean:     91.7 cm/s     Systemic VTI:  0.14 m MV E velocity: 123.00 cm/s  Systemic Diam: 2.20 cm [name] MD Electronically signed by [name] MD Signature Date/Time: 11/09/2022/5:04:46 PM    Final    DG Chest Port 1 View  Result Date: 11/07/2022 CLINICAL DATA:  Hypoxia EXAM: PORTABLE CHEST 1 VIEW COMPARISON:  11/06/2022 FINDINGS: Cardiac shadow is enlarged but stable. Postsurgical changes are again seen. Lungs demonstrate increased interstitial markings consistent with edema. No sizable effusion is noted. No pneumothorax is seen. No bony abnormality is noted. IMPRESSION: Increasing interstitial edema. Electronically Signed   By: Inez Catalina M.D.   On: 11/07/2022 22:16   DG Chest Port 1 View  Result Date: 11/06/2022 CLINICAL DATA:  Chest pain and dyspnea EXAM: PORTABLE CHEST 1 VIEW COMPARISON:  02/02/2022 chest radiograph. FINDINGS: Stable mid to lower sternotomy wire discontinuities. CABG clips overlie the mediastinum. Stable cardiomediastinal silhouette with top-normal heart size. No pneumothorax. No pleural effusion. No acute consolidative airspace disease. No overt pulmonary edema. Chronic coarsened interstitial and patchy reticular opacities throughout both lungs. IMPRESSION: No acute cardiopulmonary disease. Chronic coarsened interstitial and patchy reticular opacities throughout both lungs, favor nonspecific scarring versus chronic interstitial lung disease. Electronically Signed   By: Ilona Sorrel M.D.   On: 11/06/2022 08:49       The results of significant diagnostics from this hospitalization (including imaging, microbiology, ancillary and laboratory) are listed below for reference.     Microbiology: No results found for this or any previous visit (from the past 240 hour(s)).   Labs:  CBC: Recent Labs  Lab 11/06/22 0821 11/06/22 0822 11/07/22 0125 11/07/22 2332  11/09/22 0222  WBC 5.7  --  5.7 7.7 8.6  NEUTROABS 4.0  --   --   --   --   HGB 11.6* 10.9* 9.7* 9.6* 9.8*  HCT 34.7* 32.0* 29.7* 28.6* 30.1*  MCV 95.1  --  94.6 93.8 96.2  PLT 262  --  231 227 198   BMP &GFR Recent Labs  Lab 11/06/22 0920 11/06/22 1539 11/07/22 0125 11/07/22 2332 11/09/22 0222 11/10/22 0249  NA 134*  --  133* 130* 130* 132*  K 3.3* 2.9*  3.9 3.9 4.1 3.4*  CL 97*  --  96* 97* 96* 94*  CO2 26  --  29 25 25 28   GLUCOSE [id number]*  --  188* 239* 271* 172*  BUN 12  --  13 15 14 12   CREATININE 1.10*  --  1.04* 1.10* 0.93 0.88  CALCIUM 8.2*  --  8.3* 8.2* 8.1* 8.4*   Estimated Creatinine Clearance: 61.2 mL/min (by C-G formula based on SCr of 0.88 mg/dL). Liver & Pancreas: Recent Labs  Lab 11/06/22 0920  AST 17  ALT 7  ALKPHOS 82  BILITOT 0.7  PROT 6.4*  ALBUMIN 2.7*   No results for input(s): "LIPASE", "AMYLASE" in the last 168 hours. No results for input(s): "AMMONIA" in the last 168 hours. Diabetic: No results for input(s): "HGBA1C" in the last 72 hours. Recent Labs  Lab 11/10/22 0759 11/10/22 1139 11/10/22 1645 11/10/22 2122 11/11/22 0739  GLUCAP 131* 166* 161* 249* 87   Cardiac Enzymes: No results for input(s): "CKTOTAL", "CKMB", "CKMBINDEX", "TROPONINI" in the last 168 hours. No results for input(s): "PROBNP" in the last 8760 hours. Coagulation Profile: Recent Labs  Lab 11/06/22 0825  INR 1.6*   Thyroid Function Tests: No results for input(s): "TSH", "T4TOTAL", "FREET4", "T3FREE", "THYROIDAB" in the last 72 hours. Lipid Profile: No results for input(s): "CHOL", "HDL", "LDLCALC", "TRIG", "CHOLHDL", "LDLDIRECT" in the last 72 hours. Anemia Panel: No results for input(s): "VITAMINB12", "FOLATE", "FERRITIN", "TIBC", "IRON", "RETICCTPCT" in the last 72 hours. Urine analysis:    Component Value Date/Time   COLORURINE AMBER (A) 11/06/2022 1322   APPEARANCEUR CLEAR 11/06/2022 1322   LABSPEC 1.015 11/06/2022 1322   PHURINE 5.0 11/06/2022 1322    GLUCOSEU 50 (A) 11/06/2022 1322   GLUCOSEU 250 (A) 06/16/2022 1559   HGBUR NEGATIVE 11/06/2022 1322   BILIRUBINUR NEGATIVE 11/06/2022 1322   KETONESUR NEGATIVE 11/06/2022 1322   PROTEINUR NEGATIVE 11/06/2022 1322   UROBILINOGEN 0.2 06/16/2022 1559   NITRITE NEGATIVE 11/06/2022 1322   LEUKOCYTESUR NEGATIVE 11/06/2022 1322   Sepsis Labs: Invalid input(s): "PROCALCITONIN", "LACTICIDVEN"   SIGNED:  Mercy Riding, MD  [facility] 11/11/2022, 11:53 AM

## 2022-11-11 NOTE — Progress Notes (Signed)
Mobility Specialist Progress Note:   11/11/22 1215  Mobility  Activity Dangled on edge of bed  Level of Assistance Modified independent, requires aide device or extra time  Assistive Device None  Activity Response Tolerated fair  Mobility Referral Yes  $Mobility charge 1 Mobility    Pre Mobility: 90/51(63) During Mobility: 96/67(77)  Pt eager for mobility session however limited by low BP. Required no physical assistance to achieve EOB. Pt c/o lightheadedness/dizziness. Further mobility deferred. Pt back in bed with all needs met.   Nelta Numbers Mobility Specialist Please contact via SecureChat or  Rehab office at [phone]

## 2022-11-11 NOTE — TOC Transition Note (Signed)
Transition of Care [facility]) - CM/SW Discharge Note   Patient Details  Name: Katherine [name] MRN: [id number] Date of Birth: March 30, 1956  Transition of Care [facility]) CM/SW Contact:  Pollie Friar, RN Phone Number: 11/11/2022, 2:40 PM   Clinical Narrative:    Pt has insisted on discharge home. CM has updated the niece and pts landlord of d/c home today. They are in agreement.  Kelly with [facility] aware of d/c today.  Landlord has confirmed delivery of the new oxygen concentrator.  PTAR called and bedside RN updated.  DC packet is in pts chart.    Final next level of care: Home w Home Health Services Barriers to Discharge: No Barriers Identified   Patient Goals and CMS Choice CMS Medicare.gov Compare Post Acute Care list provided to::  (limited availabilty sent out to all agenceis in zipcode. [facility] is willing to accept.)    Discharge Placement                         Discharge Plan and Services Additional resources added to the After Visit Summary for   In-house Referral: NA Discharge Planning Services: CM Consult Post Acute Care Choice: Home Health            DME Agency: NA       HH Arranged: RN, Disease Management, PT [place] Agency: [place] Date [facility] Agency Contacted: 11/09/22 Time [place]: [id number] Representative spoke with at [place]: [place] Determinants of Health ([place]) Interventions SDOH Screenings   Food Insecurity: No Food Insecurity (07/20/2022)  Housing: Low Risk  (07/20/2022)  Transportation Needs: Unmet Transportation Needs (11/07/2022)  Utilities: At Risk (07/20/2022)  Alcohol Screen: Low Risk  (12/10/2021)  Depression (PHQ2-9): Medium Risk (02/02/2022)  Tobacco Use: Medium Risk (11/06/2022)     Readmission Risk Interventions    11/09/2022    2:22 PM 12/12/2021   10:01 AM 09/23/2021    4:39 PM  Readmission Risk Prevention Plan  Transportation Screening Complete Complete Complete  Medication Review ([place])  Referral to Pharmacy Complete Complete  PCP or Specialist appointment within 3-5 days of discharge  Complete Complete  HRI or [place] Complete Complete Complete  SW Recovery Care/Counseling Consult Complete Complete Complete  Palliative Care Screening Not Applicable Not Applicable Complete  [place] Patient Refused Not Applicable Not Applicable

## 2022-11-11 NOTE — Progress Notes (Signed)
Patient was being discharged and was being assisted to stand and pivot to stretcher with two PTAR staff members. Patient's feet slipped out from under her and she was assisted to the floor by Katherine [name] staff. This RN witnessed this occurrence. Patient was not injured. Dr. Cyndia Skeeters was notified and deemed that patient was still safe to be discharged with no further testing as patient was lowered to the floor by staff.     11/11/22 1847  What Happened  Was fall witnessed? Yes  Who witnessed fall? Kerrie Buffalo RN  Patients activity before fall to/from bed, chair, or stretcher  Point of contact buttocks  Was patient injured? No  Provider Notification  Provider Name/Title Dr. Cyndia Skeeters  Date Provider Notified 11/11/22  Time Provider Notified [id number]  Method of Notification Page  Notification Reason Fall  Provider response No new orders  Date of Provider Response 11/11/22  Time of Provider Response 1847  Follow Up  Family notified No - patient refusal  Additional tests No  Progress note created (see row info) Yes  Adult Fall Risk Assessment  Risk Factor Category (scoring not indicated) Fall has occurred during this admission (document High fall risk)  Adult Fall Risk Interventions  Required Bundle Interventions *See Row Information* High fall risk - low, moderate, and high requirements implemented  Screening for Fall Injury Risk (To be completed on HIGH fall risk patients) - Assessing Need for Floor Mats  Risk For Fall Injury- Criteria for Floor Mats Previous fall this admission  Will Implement Floor Mats  (no, as patient is being discharged)  Vitals  Temp 98.3 F (36.8 C)  Temp Source Oral  BP 121/65  MAP (mmHg) 81  BP Location Right Wrist  BP Method Automatic  Patient Position (if appropriate) Lying  Pulse Rate 95  Pulse Rate Source Monitor  ECG Heart Rate 96  Resp 16  Oxygen Therapy  SpO2 94 %  O2 Device HFNC  O2 Flow Rate (L/min) 7 L/min  Pain Assessment  Pain Scale 0-10  Pain  Score 0  PCA/Epidural/Spinal Assessment  Respiratory Pattern Regular;Unlabored  Neurological  Neuro (WDL) WDL  Glasgow Coma Scale  Eye Opening 4  Best Verbal Response (NON-intubated) 5  Best Motor Response 6  Musculoskeletal  Musculoskeletal (WDL) X  Assistive Device Front wheel walker  Generalized Weakness Yes  Weight Bearing Restrictions No

## 2022-11-12 ENCOUNTER — Encounter (HOSPITAL_COMMUNITY): Payer: Self-pay | Admitting: Internal Medicine

## 2022-11-12 ENCOUNTER — Other Ambulatory Visit (HOSPITAL_COMMUNITY): Payer: Self-pay

## 2022-11-12 DIAGNOSIS — I25709 Atherosclerosis of coronary artery bypass graft(s), unspecified, with unspecified angina pectoris: Secondary | ICD-10-CM

## 2022-11-12 DIAGNOSIS — I5042 Chronic combined systolic (congestive) and diastolic (congestive) heart failure: Secondary | ICD-10-CM | POA: Diagnosis not present

## 2022-11-12 DIAGNOSIS — N179 Acute kidney failure, unspecified: Secondary | ICD-10-CM

## 2022-11-12 DIAGNOSIS — E1169 Type 2 diabetes mellitus with other specified complication: Secondary | ICD-10-CM | POA: Diagnosis not present

## 2022-11-12 DIAGNOSIS — J9611 Chronic respiratory failure with hypoxia: Secondary | ICD-10-CM | POA: Diagnosis present

## 2022-11-12 DIAGNOSIS — I5043 Acute on chronic combined systolic (congestive) and diastolic (congestive) heart failure: Secondary | ICD-10-CM | POA: Diagnosis present

## 2022-11-12 DIAGNOSIS — E1151 Type 2 diabetes mellitus with diabetic peripheral angiopathy without gangrene: Secondary | ICD-10-CM | POA: Diagnosis present

## 2022-11-12 DIAGNOSIS — J449 Chronic obstructive pulmonary disease, unspecified: Secondary | ICD-10-CM | POA: Diagnosis present

## 2022-11-12 DIAGNOSIS — I130 Hypertensive heart and chronic kidney disease with heart failure and stage 1 through stage 4 chronic kidney disease, or unspecified chronic kidney disease: Secondary | ICD-10-CM | POA: Diagnosis present

## 2022-11-12 DIAGNOSIS — R570 Cardiogenic shock: Secondary | ICD-10-CM | POA: Diagnosis not present

## 2022-11-12 DIAGNOSIS — F32A Depression, unspecified: Secondary | ICD-10-CM | POA: Diagnosis present

## 2022-11-12 DIAGNOSIS — Z794 Long term (current) use of insulin: Secondary | ICD-10-CM | POA: Diagnosis not present

## 2022-11-12 DIAGNOSIS — Z1152 Encounter for screening for COVID-19: Secondary | ICD-10-CM | POA: Diagnosis not present

## 2022-11-12 DIAGNOSIS — N182 Chronic kidney disease, stage 2 (mild): Secondary | ICD-10-CM | POA: Diagnosis not present

## 2022-11-12 DIAGNOSIS — R0609 Other forms of dyspnea: Secondary | ICD-10-CM | POA: Diagnosis not present

## 2022-11-12 DIAGNOSIS — I959 Hypotension, unspecified: Secondary | ICD-10-CM | POA: Insufficient documentation

## 2022-11-12 DIAGNOSIS — R0602 Shortness of breath: Secondary | ICD-10-CM | POA: Diagnosis present

## 2022-11-12 DIAGNOSIS — I480 Paroxysmal atrial fibrillation: Secondary | ICD-10-CM | POA: Diagnosis not present

## 2022-11-12 DIAGNOSIS — E1122 Type 2 diabetes mellitus with diabetic chronic kidney disease: Secondary | ICD-10-CM | POA: Diagnosis present

## 2022-11-12 DIAGNOSIS — J9621 Acute and chronic respiratory failure with hypoxia: Secondary | ICD-10-CM | POA: Diagnosis present

## 2022-11-12 DIAGNOSIS — T8619 Other complication of kidney transplant: Secondary | ICD-10-CM | POA: Diagnosis present

## 2022-11-12 DIAGNOSIS — N189 Chronic kidney disease, unspecified: Secondary | ICD-10-CM

## 2022-11-12 DIAGNOSIS — E785 Hyperlipidemia, unspecified: Secondary | ICD-10-CM

## 2022-11-12 DIAGNOSIS — F1120 Opioid dependence, uncomplicated: Secondary | ICD-10-CM | POA: Diagnosis present

## 2022-11-12 DIAGNOSIS — L89152 Pressure ulcer of sacral region, stage 2: Secondary | ICD-10-CM | POA: Diagnosis present

## 2022-11-12 DIAGNOSIS — M069 Rheumatoid arthritis, unspecified: Secondary | ICD-10-CM | POA: Diagnosis present

## 2022-11-12 DIAGNOSIS — I25810 Atherosclerosis of coronary artery bypass graft(s) without angina pectoris: Secondary | ICD-10-CM | POA: Diagnosis present

## 2022-11-12 DIAGNOSIS — J849 Interstitial pulmonary disease, unspecified: Secondary | ICD-10-CM | POA: Diagnosis present

## 2022-11-12 DIAGNOSIS — I214 Non-ST elevation (NSTEMI) myocardial infarction: Secondary | ICD-10-CM | POA: Diagnosis present

## 2022-11-12 DIAGNOSIS — N1831 Chronic kidney disease, stage 3a: Secondary | ICD-10-CM | POA: Diagnosis present

## 2022-11-12 DIAGNOSIS — Z66 Do not resuscitate: Secondary | ICD-10-CM | POA: Diagnosis present

## 2022-11-12 DIAGNOSIS — I2489 Other forms of acute ischemic heart disease: Secondary | ICD-10-CM | POA: Diagnosis present

## 2022-11-12 DIAGNOSIS — Y830 Surgical operation with transplant of whole organ as the cause of abnormal reaction of the patient, or of later complication, without mention of misadventure at the time of the procedure: Secondary | ICD-10-CM | POA: Diagnosis present

## 2022-11-12 DIAGNOSIS — R0603 Acute respiratory distress: Secondary | ICD-10-CM | POA: Diagnosis not present

## 2022-11-12 LAB — GLUCOSE, CAPILLARY
Glucose-Capillary: 122 mg/dL — ABNORMAL HIGH (ref 70–99)
Glucose-Capillary: 113 mg/dL — ABNORMAL HIGH (ref 70–99)
Glucose-Capillary: 56 mg/dL — ABNORMAL LOW (ref 70–99)
Glucose-Capillary: 65 mg/dL — ABNORMAL LOW (ref 70–99)
Glucose-Capillary: 82 mg/dL (ref 70–99)
Glucose-Capillary: 212 mg/dL — ABNORMAL HIGH (ref 70–99)

## 2022-11-12 LAB — CBC
HCT: 30.8 % — ABNORMAL LOW (ref 36.0–46.0)
Hemoglobin: 9.8 g/dL — ABNORMAL LOW (ref 12.0–15.0)
MCH: 30.9 pg (ref 26.0–34.0)
MCHC: 31.8 g/dL (ref 30.0–36.0)
MCV: 97.2 fL (ref 80.0–100.0)
Platelets: 189 10*3/uL (ref 150–400)
RBC: 3.17 MIL/uL — ABNORMAL LOW (ref 3.87–5.11)
RDW: 18.7 % — ABNORMAL HIGH (ref 11.5–15.5)
WBC: 6.8 10*3/uL (ref 4.0–10.5)
nRBC: 0 % (ref 0.0–0.2)

## 2022-11-12 LAB — TROPONIN I (HIGH SENSITIVITY): Troponin I (High Sensitivity): 258 ng/L (ref <18)

## 2022-11-12 LAB — BASIC METABOLIC PANEL
Anion gap: 14 (ref 5–15)
BUN: 27 mg/dL — ABNORMAL HIGH (ref 8–23)
CO2: 26 mmol/L (ref 22–32)
Calcium: 9.1 mg/dL (ref 8.9–10.3)
Chloride: 95 mmol/L — ABNORMAL LOW (ref 98–111)
Creatinine, Ser: 1.27 mg/dL — ABNORMAL HIGH (ref 0.44–1.00)
GFR, Estimated: 47 mL/min — ABNORMAL LOW (ref >60)
Glucose, Bld: 129 mg/dL — ABNORMAL HIGH (ref 70–99)
Potassium: 4.2 mmol/L (ref 3.5–5.1)
Sodium: 135 mmol/L (ref 135–145)

## 2022-11-12 MED ORDER — BUDESONIDE 0.5 MG/2ML IN SUSP
2.0000 mL | Freq: Two times a day (BID) | RESPIRATORY_TRACT | Status: DC
  Administered 2022-11-12 – 2022-11-23 (×23): 0.5 mg via RESPIRATORY_TRACT
  Filled 2022-11-12 (×24): qty 2

## 2022-11-12 MED ORDER — ISOSORBIDE MONONITRATE ER 30 MG PO TB24
30.0000 mg | ORAL_TABLET | Freq: Every day | ORAL | Status: DC
  Administered 2022-11-12: 30 mg via ORAL
  Filled 2022-11-12: qty 1

## 2022-11-12 MED ORDER — PANTOPRAZOLE SODIUM 40 MG PO TBEC
40.0000 mg | DELAYED_RELEASE_TABLET | Freq: Every day | ORAL | Status: DC
  Administered 2022-11-12 – 2022-11-23 (×12): 40 mg via ORAL
  Filled 2022-11-12 (×12): qty 1

## 2022-11-12 MED ORDER — ONDANSETRON HCL 4 MG/2ML IJ SOLN
4.0000 mg | Freq: Four times a day (QID) | INTRAMUSCULAR | Status: DC | PRN
  Administered 2022-11-13 – 2022-11-19 (×3): 4 mg via INTRAVENOUS
  Filled 2022-11-12 (×3): qty 2

## 2022-11-12 MED ORDER — OXYCODONE HCL 5 MG PO TABS
10.0000 mg | ORAL_TABLET | Freq: Three times a day (TID) | ORAL | Status: DC | PRN
  Administered 2022-11-12 – 2022-11-23 (×22): 10 mg via ORAL
  Filled 2022-11-12 (×23): qty 2

## 2022-11-12 MED ORDER — GABAPENTIN 100 MG PO CAPS
100.0000 mg | ORAL_CAPSULE | Freq: Every day | ORAL | Status: DC | PRN
  Administered 2022-11-12 – 2022-11-21 (×6): 100 mg via ORAL
  Filled 2022-11-12 (×6): qty 1

## 2022-11-12 MED ORDER — EMPAGLIFLOZIN 10 MG PO TABS
10.0000 mg | ORAL_TABLET | Freq: Every day | ORAL | Status: DC
  Administered 2022-11-12 – 2022-11-23 (×12): 10 mg via ORAL
  Filled 2022-11-12 (×12): qty 1

## 2022-11-12 MED ORDER — BISACODYL 5 MG PO TBEC
5.0000 mg | DELAYED_RELEASE_TABLET | Freq: Every day | ORAL | Status: DC | PRN
  Administered 2022-11-16 – 2022-11-20 (×3): 5 mg via ORAL
  Filled 2022-11-12 (×3): qty 1

## 2022-11-12 MED ORDER — ADULT MULTIVITAMIN W/MINERALS CH
1.0000 | ORAL_TABLET | Freq: Every day | ORAL | Status: DC
  Administered 2022-11-12 – 2022-11-23 (×12): 1 via ORAL
  Filled 2022-11-12 (×12): qty 1

## 2022-11-12 MED ORDER — ROSUVASTATIN CALCIUM 20 MG PO TABS
20.0000 mg | ORAL_TABLET | Freq: Every day | ORAL | Status: DC
  Administered 2022-11-12 – 2022-11-23 (×12): 20 mg via ORAL
  Filled 2022-11-12 (×12): qty 1

## 2022-11-12 MED ORDER — CLOPIDOGREL BISULFATE 75 MG PO TABS
75.0000 mg | ORAL_TABLET | Freq: Every day | ORAL | Status: DC
  Administered 2022-11-12 – 2022-11-23 (×12): 75 mg via ORAL
  Filled 2022-11-12 (×12): qty 1

## 2022-11-12 MED ORDER — ALBUTEROL SULFATE (2.5 MG/3ML) 0.083% IN NEBU
2.5000 mg | INHALATION_SOLUTION | Freq: Four times a day (QID) | RESPIRATORY_TRACT | Status: DC | PRN
  Administered 2022-11-17 – 2022-11-18 (×3): 2.5 mg via RESPIRATORY_TRACT
  Filled 2022-11-12 (×5): qty 3

## 2022-11-12 MED ORDER — PREDNISONE 5 MG PO TABS
5.0000 mg | ORAL_TABLET | Freq: Every day | ORAL | Status: DC
  Administered 2022-11-12 – 2022-11-23 (×12): 5 mg via ORAL
  Filled 2022-11-12 (×12): qty 1

## 2022-11-12 MED ORDER — SERTRALINE HCL 100 MG PO TABS
200.0000 mg | ORAL_TABLET | Freq: Every day | ORAL | Status: DC
  Administered 2022-11-12 – 2022-11-23 (×12): 200 mg via ORAL
  Filled 2022-11-12 (×12): qty 2

## 2022-11-12 MED ORDER — ACETAMINOPHEN 650 MG RE SUPP: 650.0000 mg | Freq: Four times a day (QID) | RECTAL | Status: DC | PRN

## 2022-11-12 MED ORDER — APIXABAN 5 MG PO TABS
5.0000 mg | ORAL_TABLET | Freq: Two times a day (BID) | ORAL | Status: DC
  Administered 2022-11-12 – 2022-11-23 (×23): 5 mg via ORAL
  Filled 2022-11-12 (×23): qty 1

## 2022-11-12 MED ORDER — METOPROLOL TARTRATE 50 MG PO TABS
50.0000 mg | ORAL_TABLET | Freq: Two times a day (BID) | ORAL | Status: DC
  Administered 2022-11-12 – 2022-11-23 (×23): 50 mg via ORAL
  Filled 2022-11-12 (×23): qty 1

## 2022-11-12 MED ORDER — INSULIN GLARGINE-YFGN 100 UNIT/ML ~~LOC~~ SOLN
12.0000 [IU] | Freq: Every day | SUBCUTANEOUS | Status: DC
  Administered 2022-11-12 – 2022-11-22 (×11): 12 [IU] via SUBCUTANEOUS
  Filled 2022-11-12 (×11): qty 0.12

## 2022-11-12 MED ORDER — INSULIN ASPART 100 UNIT/ML IJ SOLN
3.0000 [IU] | Freq: Three times a day (TID) | INTRAMUSCULAR | Status: DC
  Administered 2022-11-12 – 2022-11-22 (×22): 3 [IU] via SUBCUTANEOUS

## 2022-11-12 MED ORDER — SODIUM CHLORIDE 0.9 % IV BOLUS
500.0000 mL | Freq: Once | INTRAVENOUS | Status: AC
  Administered 2022-11-12: 500 mL via INTRAVENOUS

## 2022-11-12 MED ORDER — INSULIN ASPART 100 UNIT/ML IJ SOLN
0.0000 [IU] | Freq: Three times a day (TID) | INTRAMUSCULAR | Status: DC
  Administered 2022-11-12: 1 [IU] via SUBCUTANEOUS
  Administered 2022-11-13: 2 [IU] via SUBCUTANEOUS
  Administered 2022-11-13 (×2): 1 [IU] via SUBCUTANEOUS
  Administered 2022-11-14: 2 [IU] via SUBCUTANEOUS
  Administered 2022-11-14 – 2022-11-16 (×6): 1 [IU] via SUBCUTANEOUS
  Administered 2022-11-16 – 2022-11-17 (×2): 2 [IU] via SUBCUTANEOUS
  Administered 2022-11-17 – 2022-11-18 (×2): 1 [IU] via SUBCUTANEOUS
  Administered 2022-11-18: 2 [IU] via SUBCUTANEOUS
  Administered 2022-11-18: 1 [IU] via SUBCUTANEOUS
  Administered 2022-11-19: 2 [IU] via SUBCUTANEOUS
  Administered 2022-11-19: 1 [IU] via SUBCUTANEOUS
  Administered 2022-11-19 – 2022-11-20 (×3): 2 [IU] via SUBCUTANEOUS
  Administered 2022-11-21: 1 [IU] via SUBCUTANEOUS
  Administered 2022-11-21: 2 [IU] via SUBCUTANEOUS
  Administered 2022-11-21: 5 [IU] via SUBCUTANEOUS
  Administered 2022-11-22: 1 [IU] via SUBCUTANEOUS
  Administered 2022-11-22: 2 [IU] via SUBCUTANEOUS
  Administered 2022-11-23: 1 [IU] via SUBCUTANEOUS

## 2022-11-12 MED ORDER — AZATHIOPRINE 50 MG PO TABS
125.0000 mg | ORAL_TABLET | Freq: Every day | ORAL | Status: DC
  Administered 2022-11-12 – 2022-11-22 (×11): 125 mg via ORAL
  Filled 2022-11-12 (×12): qty 3

## 2022-11-12 MED ORDER — ARFORMOTEROL TARTRATE 15 MCG/2ML IN NEBU
15.0000 ug | INHALATION_SOLUTION | Freq: Two times a day (BID) | RESPIRATORY_TRACT | Status: DC
  Administered 2022-11-12 – 2022-11-23 (×23): 15 ug via RESPIRATORY_TRACT
  Filled 2022-11-12 (×24): qty 2

## 2022-11-12 MED ORDER — LORAZEPAM 1 MG PO TABS
1.0000 mg | ORAL_TABLET | Freq: Three times a day (TID) | ORAL | Status: DC | PRN
  Administered 2022-11-12 – 2022-11-23 (×23): 1 mg via ORAL
  Filled 2022-11-12 (×23): qty 1

## 2022-11-12 MED ORDER — RANOLAZINE ER 500 MG PO TB12
1000.0000 mg | ORAL_TABLET | Freq: Two times a day (BID) | ORAL | Status: DC
  Administered 2022-11-12 – 2022-11-23 (×23): 1000 mg via ORAL
  Filled 2022-11-12 (×23): qty 2

## 2022-11-12 MED ORDER — GLIMEPIRIDE 4 MG PO TABS
4.0000 mg | ORAL_TABLET | Freq: Every day | ORAL | Status: DC
  Administered 2022-11-12: 4 mg via ORAL
  Filled 2022-11-12: qty 1

## 2022-11-12 MED ORDER — LAMOTRIGINE 25 MG PO TABS
25.0000 mg | ORAL_TABLET | Freq: Every day | ORAL | Status: DC
  Administered 2022-11-12 – 2022-11-22 (×11): 25 mg via ORAL
  Filled 2022-11-12 (×12): qty 1

## 2022-11-12 MED ORDER — FLUTICASONE PROPIONATE 50 MCG/ACT NA SUSP
2.0000 | Freq: Every day | NASAL | Status: DC
  Administered 2022-11-12 – 2022-11-23 (×11): 2 via NASAL
  Filled 2022-11-12: qty 16

## 2022-11-12 MED ORDER — ACETAMINOPHEN 325 MG PO TABS
650.0000 mg | ORAL_TABLET | Freq: Four times a day (QID) | ORAL | Status: DC | PRN
  Administered 2022-11-12 – 2022-11-21 (×6): 650 mg via ORAL
  Filled 2022-11-12 (×6): qty 2

## 2022-11-12 MED ORDER — MONTELUKAST SODIUM 10 MG PO TABS
10.0000 mg | ORAL_TABLET | Freq: Every day | ORAL | Status: DC
  Administered 2022-11-12 – 2022-11-22 (×11): 10 mg via ORAL
  Filled 2022-11-12 (×11): qty 1

## 2022-11-12 MED ORDER — ONDANSETRON HCL 4 MG PO TABS: 4.0000 mg | ORAL_TABLET | Freq: Four times a day (QID) | ORAL | Status: DC | PRN

## 2022-11-12 NOTE — Assessment & Plan Note (Signed)
Due to recent NSTEMI and acute on chronic combined CHF. Not really able to diurese any further I dont think given soft BPs today and now AKI developed over the past 24h it looks like based on lab work. Plan at this point it DC to SNF which pt is now willing to go to.

## 2022-11-12 NOTE — Progress Notes (Signed)
Heart Failure Navigator Progress Note  Assessed for Heart & Vascular TOC clinic readiness.  Patient [name] 35-40%, has a scheduled [facility] appointment on 11/20/2022.   Navigator available for reassessment of patient.   Earnestine Leys, BSN, Clinical cytogeneticist Only

## 2022-11-12 NOTE — Progress Notes (Signed)
PROGRESS NOTE  Katherine [name] [id number] DOB: 04-Jun-1956   PCP: [name], MD  Patient is from: Home  DOA: 11/11/2022 LOS: 0  Chief complaints Chief Complaint  Patient presents with   Shortness of Breath     Brief Narrative / Interim history: 67 year old F with PMH of COPD, combined CHF, OSA, chronic hypoxic RF on 7 L, CAD, ESRD s/p renal transplant, Afib on eliquis returning with shortness of breath 3 hours after hospital discharge.   Patient was hospitalized from 3/22-3/27 for NSTEMI / acute on chronic combined CHF.  Troponin trended from 27-428.  Cardiology consulted, pt not felt to be candidate for LHC due to vascular access issues and transplanted kidney.  TTE with LVEF of 35 to 40%, dyskinesis of mid to distal inferior septal wall and entire apex. Patient was diuresed with IV Lasix and cleared for discharge by cardiology on p.o. Lasix 40 mg twice daily.  [name] was initially added but discontinued on discharge due to soft blood pressures.  Imdur was reduced to 30 mg daily as well.  Therapy recommended SNF but patient chose to go home with home health and DME.  Unfortunately, she didn't do well at all after EMS got to her house.  She became severely SOB just going up the front stairs and pt brought back to ED. Patient's dyspnea improved while in ED.  She had mild AKI.   Now willing to go to SNF.  TOC consulted.   Subjective: Seen and examined earlier this morning.  No major events overnight.  She is a sleepy but wakes to voice.  Reports improvement in her breathing.  Denies chest pain.  Feels weak.  Objective: Vitals:   11/12/22 0811 11/12/22 0857 11/12/22 1148 11/12/22 1200  BP: 119/75 (!) 114/55 (!) 82/47 (!) 95/55  Pulse: (!) 104 (!) 103 63 61  Resp: (!) 25 18 18    Temp: 97.7 F (36.5 C) 98.7 F (37.1 C) 98.7 F (37.1 C)   TempSrc: Oral Oral Oral   SpO2:  97% 94% 100%  Weight: 80 kg 92.4 kg    Height: 5\' 1"  (1.549 m)       Examination:  GENERAL: No  apparent distress.  Nontoxic. HEENT: MMM.  Vision and hearing grossly intact.  NECK: Supple.  No apparent JVD.  RESP:  No IWOB.  Fair aeration bilaterally. CVS:  RRR. Heart sounds normal.  ABD/GI/GU: BS+. Abd soft, NTND.  MSK/EXT:  Moves extremities. No apparent deformity. No edema.  SKIN: no apparent skin lesion or wound NEURO: Sleepy but wakes to voice.  No apparent focal neuro deficit. PSYCH: Calm. Normal affect.   Procedures:  None  Microbiology summarized: None  Assessment and plan: Principal Problem:   Dyspnea on exertion Active Problems:   Acute kidney injury superimposed on CKD (HCC)   CAD (coronary artery disease) of bypass graft   Paroxysmal atrial fibrillation ([place]); CHA2DS2VASc score F, HTN, CAD, CVA = 5   Type 2 diabetes mellitus with hyperlipidemia (HCC)   Hypotension   Dyspnea on exertion/COPD/chronic hypoxic RF on 7 L at baseline: Dyspnea has improved. -Continue home inhalers and oxygen  Elevated troponin/CAD s/p CABG with multiple stenting:Elevated troponin likely demand ischemia -Previously evaluated by cardiology who recommended medical mgt -Continue Plavix, ranolazine, Crestor, metoprolol  -Called Imdur due to hypotension. -Outpatient follow-up with cardiology   Chronic combined CHF: TTE as above.  Diuresed with IV Lasix and discharged on p.o. Lasix previous hosp.  Now with AKI and hypotension. -Hold diuretics and  imdur -Entresto discontinued previous hospitalization due to hypotension. -Strict intake and output, daily weight, renal functions and electrolytes   AKI on CKD-3A/Hx of renal transplant: Improving. Recent Labs    12/11/21 0122 02/02/22 1548 06/16/22 1559 11/06/22 0920 11/07/22 0125 11/07/22 2332 11/09/22 0222 11/10/22 0249 11/11/22 2112 11/12/22 0928  BUN 23 20 13 12 13 15 14 12  30* 27*  CREATININE 1.13* 0.81 0.92 1.10* 1.04* 1.10* 0.93 0.88 1.48* 1.27*  -Continue holding diuretics -Avoid hypotension -Continue home Imuran and  prednisone  Hypotension: Likely iatrogenic from BP meds, opiate and benzo. -Discontinue Imdur -IV NS bolus 500 cc x 1 -Decrease opiate and benzo if no improvement in BP.  Uncontrolled IDDM-2 with hyperglycemia: A1c 8.5% in 05/2022. Recent Labs  Lab 11/11/22 0739 11/11/22 1207 11/11/22 1749 11/12/22 0918 11/12/22 1147  GLUCAP 87 272* 208* 122* 113*  -Continue Semglee 12 units daily, NovoLog 3 units 3 times daily with meals and SSI-sensitive -Recheck hemoglobin A1c  Paroxysmal A-fib -Metoprolol, Eliquis   Left arm lesion -Has been ongoing over the past several months, has outpatient surgery referral for removal   Debility: Therapy recommended SNF but patient chose to go home with home health previous hospitalization.  Now willing to go to SNF -[facility] consulted.   Anxiety and depression: Poorly controlled. -Vraylar, Lamictal, Ativan, Zoloft   Chronic back pain, narcotic dependent: Narcotic database reviewed. -Oxycodone, Flexeril   Pressure skin injury: POA  Morbid Obesity:  Body mass index is 38.49 kg/m.  Pressure Skin Injury: POA Pressure Injury 11/06/22 Sacrum Mid;Lower Stage 2 -  Partial thickness loss of dermis presenting as a shallow open injury with a red, pink wound bed without slough. (Active)  11/06/22 2000  Location: Sacrum  Location Orientation: Mid;Lower  Staging: Stage 2 -  Partial thickness loss of dermis presenting as a shallow open injury with a red, pink wound bed without slough.  Wound Description (Comments):   Present on Admission: Yes  Dressing Type Foam - Lift dressing to assess site every shift 11/11/22 0800   DVT prophylaxis:   apixaban (ELIQUIS) tablet 5 mg  Code Status: SNR/DNI Family Communication: None at bedside Level of care: Telemetry Cardiac Status is: Observation The patient remains OBS appropriate and will d/c before 2 midnights.   Final disposition:  Consultants:  None  35 minutes with more than 50% spent in reviewing  records, counseling patient/family and coordinating care.   Sch Meds:  Scheduled Meds:  apixaban  5 mg Oral BID   arformoterol  15 mcg Nebulization BID   azaTHIOprine  125 mg Oral Daily   budesonide  2 mL Nebulization BID   clopidogrel  75 mg Oral Daily   empagliflozin  10 mg Oral Daily   fluticasone  2 spray Each Nare Daily   insulin aspart  0-9 Units Subcutaneous TID WC   insulin aspart  3 Units Subcutaneous TID WC   insulin glargine-yfgn  12 Units Subcutaneous Daily   lamoTRIgine  25 mg Oral QHS   metoprolol tartrate  50 mg Oral BID   montelukast  10 mg Oral QHS   multivitamin with minerals  1 tablet Oral Daily   pantoprazole  40 mg Oral Daily   predniSONE  5 mg Oral Q breakfast   ranolazine  1,000 mg Oral BID   rosuvastatin  20 mg Oral Daily   sertraline  200 mg Oral Daily   Continuous Infusions: PRN Meds:.acetaminophen **OR** acetaminophen, albuterol, bisacodyl, gabapentin, LORazepam, ondansetron **OR** ondansetron (ZOFRAN) IV, oxyCODONE  Antimicrobials: Anti-infectives (  From admission, onward)    None        I have personally reviewed the following labs and images: CBC: Recent Labs  Lab 11/06/22 0821 11/06/22 0822 11/07/22 0125 11/07/22 2332 11/09/22 0222 11/11/22 2112 11/12/22 0928  WBC 5.7  --  5.7 7.7 8.6 7.1 6.8  NEUTROABS 4.0  --   --   --   --   --   --   HGB 11.6*   < > 9.7* 9.6* 9.8* 9.6* 9.8*  HCT 34.7*   < > 29.7* 28.6* 30.1* 29.2* 30.8*  MCV 95.1  --  94.6 93.8 96.2 96.7 97.2  PLT 262  --  231 227 198 208 189   < > = values in this interval not displayed.   BMP &GFR Recent Labs  Lab 11/07/22 2332 11/09/22 0222 11/10/22 0249 11/11/22 2112 11/12/22 0928  NA 130* 130* 132* 131* 135  K 3.9 4.1 3.4* 4.6 4.2  CL 97* 96* 94* 91* 95*  CO2 25 25 28 27 26   GLUCOSE 239* 271* 172* 218* 129*  BUN 15 14 12  30* 27*  CREATININE 1.10* 0.93 0.88 1.48* 1.27*  CALCIUM 8.2* 8.1* 8.4* 8.9 9.1   Estimated Creatinine Clearance: 45.7 mL/min (A) (by C-G  formula based on SCr of 1.27 mg/dL (H)). Liver & Pancreas: Recent Labs  Lab 11/06/22 0920  AST 17  ALT 7  ALKPHOS 82  BILITOT 0.7  PROT 6.4*  ALBUMIN 2.7*   No results for input(s): "LIPASE", "AMYLASE" in the last 168 hours. No results for input(s): "AMMONIA" in the last 168 hours. Diabetic: No results for input(s): "HGBA1C" in the last 72 hours. Recent Labs  Lab 11/11/22 0739 11/11/22 1207 11/11/22 1749 11/12/22 0918 11/12/22 1147  GLUCAP 87 272* 208* 122* 113*   Cardiac Enzymes: No results for input(s): "CKTOTAL", "CKMB", "CKMBINDEX", "TROPONINI" in the last 168 hours. No results for input(s): "PROBNP" in the last 8760 hours. Coagulation Profile: Recent Labs  Lab 11/06/22 0825  INR 1.6*   Thyroid Function Tests: No results for input(s): "TSH", "T4TOTAL", "FREET4", "T3FREE", "THYROIDAB" in the last 72 hours. Lipid Profile: No results for input(s): "CHOL", "HDL", "LDLCALC", "TRIG", "CHOLHDL", "LDLDIRECT" in the last 72 hours. Anemia Panel: No results for input(s): "VITAMINB12", "FOLATE", "FERRITIN", "TIBC", "IRON", "RETICCTPCT" in the last 72 hours. Urine analysis:    Component Value Date/Time   COLORURINE AMBER (A) 11/06/2022 1322   APPEARANCEUR CLEAR 11/06/2022 1322   LABSPEC 1.015 11/06/2022 1322   PHURINE 5.0 11/06/2022 1322   GLUCOSEU 50 (A) 11/06/2022 1322   GLUCOSEU 250 (A) 06/16/2022 1559   HGBUR NEGATIVE 11/06/2022 1322   BILIRUBINUR NEGATIVE 11/06/2022 1322   KETONESUR NEGATIVE 11/06/2022 1322   PROTEINUR NEGATIVE 11/06/2022 1322   UROBILINOGEN 0.2 06/16/2022 1559   NITRITE NEGATIVE 11/06/2022 1322   LEUKOCYTESUR NEGATIVE 11/06/2022 1322   Sepsis Labs: Invalid input(s): "PROCALCITONIN", "LACTICIDVEN"  Microbiology: No results found for this or any previous visit (from the past 240 hour(s)).  Radiology Studies: DG Chest 2 View  Result Date: 11/11/2022 CLINICAL DATA:  Shortness of breath EXAM: CHEST - 2 VIEW COMPARISON:  11/07/2022, CT  08/05/2021, 09/20/2021, chest x-ray 05/15/2021 FINDINGS: Post sternotomy changes. Cardiomegaly. No pleural effusion or pneumothorax. Mild diffuse interstitial opacity which may be secondary to low-grade edema, overall aeration is improved compared to the radiograph from 03/23. IMPRESSION: Cardiomegaly with mild diffuse interstitial opacity which may be secondary to low-grade edema but overall aeration is improved as compared with 11/07/2022. Electronically Signed  By: Donavan Foil M.D.   On: 11/11/2022 21:58      Carl Bleecker T. [place]  If 7PM-7AM, please contact night-coverage [email or web address] 11/12/2022, 12:40 PM

## 2022-11-12 NOTE — ED Provider Notes (Signed)
Patient care assumed from Quentin Mulling, PA-C at shift change  In short, 67 year old female patient with past medical history significant for COPD, combined CHF, OSA, chronic hypoxic respiratory failure on chronic oxygen (7 L at home), CAD, PAF, end-stage renal disease status post renal transplant, CVA, CKD 3A presented to the emergency room with shortness of breath which began just after discharge earlier today.  Patient was discharged today after recent admission due to NSTEMI.  Upon arrival at her home she began to have some chest pain and shortness of breath while trying to ambulate into her home.  EMS returned with the patient to the hospital.  It was recommended that she be placed in a SNF for the last admission but the patient declined.  Pertinent workup prior to my assumption of care included troponin of 228, creatinine 1.48.  Cardiology was consulted and stated they felt that there was likely no planned intervention from their perspective.  Plan was for me to follow the delta troponin, reconsult cards, and possibly discussed with medicine for admission due to shortness of breath and new AKI Physical Exam  BP 92/63   Pulse 99   Temp 98.3 F (36.8 C) (Oral)   Resp (!) 22   SpO2 92%   Physical Exam  Procedures  Procedures  ED Course / MDM    Medical Decision Making Amount and/or Complexity of Data Reviewed Labs: ordered. Radiology: ordered.  Risk Prescription drug management. Decision regarding hospitalization.   I reconsulted cardiology.  Dr.Ugowe informed of new troponin level of 258.  States no plans for intervention from cardiology.  Consulted the hospitalist service for admission due to the patient's AKI. Dr.Gardner agrees to see for admission       Ronny Bacon 11/12/22 0302    Mesner, Corene Cornea, MD 11/12/22 [phone]

## 2022-11-12 NOTE — Assessment & Plan Note (Addendum)
Just had NSTEMI this past week. Cont Imdur, ranexa, lopressor, eliquis. EDP d/w cards who didn't have any further recommendations today as far as heparin gtt etc Does NOT sound like she is a candidate for LHC at this stage (see prior cards notes).

## 2022-11-12 NOTE — ED Notes (Signed)
Patient is resting comfortably. 

## 2022-11-12 NOTE — ED Notes (Signed)
ED TO INPATIENT HANDOFF REPORT   S Name/Age/Gender Katherine [name] 67 y.o. female Room/Bed: 033C/033C  Code Status   Code Status: DNR  Home/SNF/Other Skilled nursing facility Patient oriented to: self, place, time, and situation Is this baseline? Yes   Triage Complete: Triage complete  Chief Complaint Dyspnea on exertion [R06.09]  Triage Note WHen she got dropped off at home from discharge today, she became SOB and had chest pain. PTAR called EMS when she got home since she was complaining of chest pain. She states she was forced to walk up her stairs by [facility] and began to sweat and have chest pain. She is no chest pain free. Took 1 nitro and aspirin given by EMS.    Allergies Allergies  Allergen Reactions   Tetracycline Hives    Patient tolerated Doxycycline Dec 2020   Niacin Other (See Comments)    Mouth blisters   Niaspan [Niacin Er] Other (See Comments)    Mouth blisters   Sulfa Antibiotics Nausea Only and Other (See Comments)    "Tears up stomach"   Sulfonamide Derivatives Other (See Comments)    Reaction: per patient "tears her stomach up"   Codeine Nausea And Vomiting   Erythromycin Nausea And Vomiting   Hydromorphone Hcl Nausea And Vomiting   Morphine And Related Nausea And Vomiting   Nalbuphine Nausea And Vomiting    Nubain   Sulfasalazine Nausea Only and Other (See Comments)    per patient "tears her stomach up", "Tears up stomach"   Tape Rash and Other (See Comments)    No "plastic" tape," please----cloth tape only    Level of Care/Admitting Diagnosis ED Disposition     ED Disposition  Admit   Condition  --   [place]: [place] [[id number]]  Level of Care: Telemetry Cardiac [[id number]]  May place patient in observation at [facility] or [facility] if equivalent level of care is available:: No  Covid Evaluation: Asymptomatic - no recent exposure (last 10 days) testing not required  Diagnosis: Dyspnea on exertion  [id number]  Admitting Physician: Etta Quill [id number]  Attending Physician: Etta Quill [[id number]]          B Medical/Surgery History Past Medical History:  Diagnosis Date   Anemia    Anxiety    Bilateral carotid artery stenosis    Carotid duplex [id number]: [id number] LICA, [id number] RICA, [id number] RECA, f/u 1 yr suggested   CAD (coronary artery disease) of bypass graft 5/01; 3/'02, 8/'03, 10/'04; 1/15   PCI x 5 to SVG-D1    CAD in native artery 07/1993   3 Vessel Disease (LAD-D1 & RCA) -- CABG (Dx in setting of inferior STEMI-PTCA of RCA)   COPD mixed type ([place])    Followed by Dr. Lamonte Sakai "pulmonologist said no COPD"   Depression with anxiety    Diabetes mellitus type 2 in obese ([place])    Diarrhea    started after cholecystectomy and mass removed from intestine   Dyslipidemia, goal LDL below 70    08/2012: TC 137, TG 200, HDL 32!, LDL 45; on statin (followed by Dr.Deterding)   ESRD (end stage renal disease) ([place]) 1991   s/p Cadaveric Renal Transplant [facility] - Dr. 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  Had having having lots of nausea and vomiting with poor p.o. intake.  Thought to have QT prolongation with multiple medications involved and hypomagnesemia, hypokalemia.  Tikosyn discontinued along with Zoloft and Phenergan.   Hypertension associated with diabetes ([place])    Mild aortic stenosis by prior echocardiogram 07/2019   Echo:  Mild aortic stenosis (gradients: Mean 14.3 mmHg -peak 24.9 mmHg).   Morbid obesity ([facility])    MRSA (methicillin resistant staph aureus) culture positive    OSA  (obstructive sleep apnea)    no longer on CPAP or home O2, states she doesn't need now after lap band   PAD (peripheral artery disease) ([place]) 08/2013   LEA Dopplers to be read by Dr. Fletcher Anon   PAF (paroxysmal atrial fibrillation) ([place]) 06/2014   Noted on CardioNet Monitor  - --> rhythm control with Tikosyn (Dr. Rayann Heman); converted from warfarin to apixaban for anticoagulation.   Pneumonia    Recurrent boils    Bilateral Groin   Rheumatoid arthritis ([place])    Per Patient Report; associated with OA   S/p cadaver renal transplant 1991   [facility]   Past Surgical History:  Procedure Laterality Date   ABDOMINAL AORTOGRAM N/A 04/21/2018   Procedure: ABDOMINAL AORTOGRAM;  Surgeon: [name], MD;  Location: [facility];  Service: Cardiovascular;  Laterality: N/A;   CATHETER REMOVAL     CHOLECYSTECTOMY N/A 10/29/2014   Procedure: LAPAROSCOPIC CHOLECYSTECTOMY WITH INTRAOPERATIVE CHOLANGIOGRAM;  Surgeon: Excell Seltzer, MD;  Location: [facility];  Service: General;  Laterality: N/A;   CORONARY ANGIOPLASTY  1994   x5   CORONARY ARTERY BYPASS GRAFT  1995   LIMA-LAD, SVG-RPDA, SVG-D1   CORONARY STENT INTERVENTION N/A 02/20/2021   Procedure: PERCUTANEOUS CORONARY STENT INTERVENTION;  Surgeon: [name], MD;  Location: [facility]; ostLCx 60% (Neg RFR 0.96);; SVG- D2 recurrent 90% ISR & 95% native D2 after graft-> DES PCI of 95% anastomotic D2 lesion (Onyx Frontier 2.25 mm x 12 mm => 2.75 mm @ overlap, 2.5 distal.);PTCA of ISR in body of graft. ->  2.5 mm scoring balloon & post-dil w/ 2.75 mm Catarina balloon   ESOPHAGOGASTRODUODENOSCOPY N/A 10/15/2016   Procedure: ESOPHAGOGASTRODUODENOSCOPY (EGD);  Surgeon: Wilford Corner, MD;  Location: [facility] ENDOSCOPY;  Service: Endoscopy;  Laterality: N/A;   I & D EXTREMITY Right 01/29/2018   Procedure: IRRIGATION AND DEBRIDEMENT THUMB;  Surgeon: Dayna Barker, MD;  Location: [facility];  Service: Plastics;  Laterality: Right;   INCISE AND DRAIN ABCESS      INTRAVASCULAR PRESSURE WIRE/FFR STUDY N/A 02/20/2021   Procedure: INTRAVASCULAR PRESSURE WIRE/FFR STUDY;  Surgeon: [name], MD;  Location: [facility];  Service: Cardiovascular;  Laterality: N/A;   KIDNEY TRANSPLANT  1991   KNEE ARTHROSCOPY WITH LATERAL MENISECTOMY Left 12/03/2017   Procedure: LEFT KNEE ARTHROSCOPY WITH LATERAL MENISECTOMY;  Surgeon: Earlie Server, MD;  Location: [facility];  Service: Orthopedics;  Laterality: Left;   LAPAROSCOPIC GASTRIC BANDING  04/2004; 10/'09, 2/'10   Port Replacement x 2   LEFT HEART CATH AND CORONARY ANGIOGRAPHY N/A 02/20/2021   Procedure: LEFT HEART CATH AND CORONARY ANGIOGRAPHY;  Surgeon: [name], MD;  Location: [facility];  Service: Cardiovascular;  Laterality: N/A;   LEFT HEART CATH AND  CORS/GRAFTS ANGIOGRAPHY N/A 04/21/2018   Procedure: LEFT HEART CATH AND CORS/GRAFTS ANGIOGRAPHY;  Surgeon: [name], MD;  Location: [facility];  Ost-Prox LAD 50% - proxLAD (pre & post D1) 100% CTO. Cx - patent, small OM1 (stable ~ ostial OM1 90%, too small for PCI) & 2 small LPL; Ost-distal RCA 100% CTO.  LIMA-LAD (not injected); SVG-dRCA patent, SVG-D1 - insertion stent ~20% ISR - Severe R CFA disease w/ focal Sub TO   LEFT HEART CATH AND CORS/GRAFTS ANGIOGRAPHY  5/'01, 3/'02, 8/'03, 10/'04; 1/'15   08/22/2013: LAD & RCA 100%; LIMA-LAD & SVG-rPDA patent; Cx-- OM1 60%, OM2 ostial ~50%; SVG-D1 - 80% mid, 50% distal ISR --PCI   LEFT HEART CATH AND CORS/GRAFTS ANGIOGRAPHY N/A 01/31/2021   Procedure: LEFT HEART CATH AND CORS/GRAFTS ANGIOGRAPHY;  Surgeon: Jolaine Artist, MD;  Location: [facility];;   LEFT HEART CATH AND CORS/GRAFTS ANGIOGRAPHY N/A 09/05/2021   Procedure: LEFT HEART CATH AND CORS/GRAFTS ANGIOGRAPHY;  Surgeon: [name], MD;  Location: [facility];  Service: Cardiovascular;  Laterality: N/A;   LEFT HEART CATH AND CORS/GRAFTS ANGIOGRAPHY N/A 08/21/2021   Procedure: LEFT HEART CATH AND CORS/GRAFTS  ANGIOGRAPHY;  Surgeon: Belva Crome, MD;  Location: [facility];  Service: Cardiovascular;  Laterality: N/A;   LEFT HEART CATHETERIZATION WITH CORONARY/GRAFT ANGIOGRAM N/A 08/23/2013   Procedure: LEFT HEART CATHETERIZATION WITH Beatrix Fetters;  Surgeon: Wellington Hampshire, MD;  Location: [facility] LAB;  Service: Cardiovascular;  Laterality: N/A;   Lower Extremity Arterial Dopplers  08/2013   ABI: R 0.96, L 1.04   MULTIPLE TOOTH EXTRACTIONS  age 48   NM MYOVIEW LTD  03/2016   EF 62%. LOW RISK. C/W prior MI - no Ischemia. Apical hypokinesis.   PERCUTANEOUS CORONARY STENT INTERVENTION (PCI-S)  5/'01, 3/'02, 8/'03, 10/'04;   '01 - S660 BMS 2.5 x 9 - dSVG-D1 into D1; '02- post-stent stenosis - 2.5 x 8 Pixel BMS; '8\03: ISR/Thrombosis into native D1 - AngioJet, 2.5 x 13 Pixel; '04 - ISR 95% - covered stented area with Taxus DES 2.5 mm x 20 (2.88)   PERCUTANEOUS CORONARY STENT INTERVENTION (PCI-S)  08/23/2013   Procedure: PERCUTANEOUS CORONARY STENT INTERVENTION (PCI-S);  Surgeon: Wellington Hampshire, MD;  Location: [facility] CATH LAB;  Service: Cardiovascular;;mid SVG-D1 80%; distal stent ~50% ISR; Promus Prermier DES 2.75 mm xc 20 mm (2.8 mm)   PORT-A-CATH REMOVAL     kidney   TRANSTHORACIC ECHOCARDIOGRAM  07/2019   a) 07/2019: EF 55 to 60%.  No LVH.  Paradoxical septal WM-s/p CABG.  GRII DD.  Nl RV size and fxn.  Mild bilateral atrial dilation.  Mod MAC.  Trace MR.  Mild AS (gradients: Mean 14.3 mmHg -peak 24.9 mmHg).; B) 06/2020: EF 40 to 45%.  Moderate concentric LVH.  GRII DD.  Elevated LAP.  Mod HK mid Apical Ant-AntSept wall & mild Apical Dyskinesis.  Mod LA dilation.  Mild MR.  AoV sclerosis w/o AS.   TRANSTHORACIC ECHOCARDIOGRAM  01/30/2021   EF 55 to 60%.  Mild LVH.  GR 1 DD.  Elevated LAP.  Moderate LA dilation.  Mild MR with mild MS.  Mild aortic valve stenosis.:   TUBAL LIGATION     wrist fistula repair Left    dialysis for one year     A IV Location/Drains/Wounds Patient  Lines/Drains/Airways Status     Active Line/Drains/Airways     Name Placement date Placement time Site Days   Peripheral  IV 11/11/22 20 G 1" Posterior;Right Wrist 11/11/22  2317  Wrist  1   External Urinary Catheter 11/12/22  0302  --  less than 1   Pressure Injury 10/18/21 Buttocks Right Unstageable - Full thickness tissue loss in which the base of the injury is covered by slough (yellow, tan, gray, green or brown) and/or eschar (tan, brown or black) in the wound bed. brown, yellow, tan wound bed 10/18/21  2337  -- 390   Pressure Injury 11/21/21 Heel Right brown colored wound bed 11/21/21  2000  -- 356   Pressure Injury 11/06/22 Sacrum Mid;Lower Stage 2 -  Partial thickness loss of dermis presenting as a shallow open injury with a red, pink wound bed without slough. 11/06/22  2000  -- 6   Wound / Incision (Open or Dehisced) 11/06/22 Other (Comment) Arm Left;Lower;Posterior unknown type of Stage 2 wound w abnormal raised borders 11/06/22  2000  Arm  6            Intake/Output Last 24 hours No intake or output data in the 24 hours ending 11/12/22 0657  Labs/Imaging Results for orders placed or performed during the hospital encounter of 11/11/22 (from the past 48 hour(s))  Basic metabolic panel     Status: Abnormal   Collection Time: 11/11/22  9:12 PM  Result Value Ref Range   Sodium 131 (L) 135 - 145 mmol/L   Potassium 4.6 3.5 - 5.1 mmol/L   Chloride 91 (L) 98 - 111 mmol/L   CO2 27 22 - 32 mmol/L   Glucose, Bld 218 (H) 70 - 99 mg/dL    Comment: Glucose reference range applies only to samples taken after fasting for at least 8 hours.   BUN 30 (H) 8 - 23 mg/dL   Creatinine, Ser 1.48 (H) 0.44 - 1.00 mg/dL   Calcium 8.9 8.9 - 10.3 mg/dL   GFR, Estimated 39 (L) >60 mL/min    Comment: (NOTE) Calculated using the CKD-EPI Creatinine Equation (2021)    Anion gap 13 5 - 15    Comment: Performed at [facility] [address], [place], [place] [address]  CBC     Status: Abnormal    Collection Time: 11/11/22  9:12 PM  Result Value Ref Range   WBC 7.1 4.0 - 10.5 K/uL   RBC 3.02 (L) 3.87 - 5.11 MIL/uL   Hemoglobin 9.6 (L) 12.0 - 15.0 g/dL   HCT 29.2 (L) 36.0 - 46.0 %   MCV 96.7 80.0 - 100.0 fL   MCH 31.8 26.0 - 34.0 pg   MCHC 32.9 30.0 - 36.0 g/dL   RDW 18.9 (H) 11.5 - 15.5 %   Platelets 208 150 - 400 K/uL   nRBC 0.3 (H) 0.0 - 0.2 %    Comment: Performed at [facility] [address], [place], [place] [address]  Troponin I (High Sensitivity)     Status: Abnormal   Collection Time: 11/11/22  9:12 PM  Result Value Ref Range   Troponin I (High Sensitivity) 228 (HH) <18 ng/L    Comment: CRITICAL RESULT CALLED TO, READ BACK BY AND VERIFIED WITH M. MOTSINGER RN 11/11/22 @2238  BY J. WHITE (NOTE) Elevated high sensitivity troponin I (hsTnI) values and significant  changes across serial measurements may suggest ACS but many other  chronic and acute conditions are known to elevate hsTnI results.  Refer to the "Links" section for chest pain algorithms and additional  guidance. Performed at [facility], [address] [place], [place] [address]   Brain natriuretic peptide     Status: Abnormal   Collection Time: 11/11/22  9:59 PM  Result Value Ref Range   B Natriuretic Peptide 396.5 (H) 0.0 - 100.0 pg/mL    Comment: Performed at [facility] [address], [place], [place] [address]  Troponin I (High Sensitivity)     Status: Abnormal   Collection Time: 11/11/22 11:12 PM  Result Value Ref Range   Troponin I (High Sensitivity) 258 (HH) <18 ng/L    Comment: CRITICAL VALUE NOTED. VALUE IS CONSISTENT WITH PREVIOUSLY REPORTED/CALLED VALUE (NOTE) Elevated high sensitivity troponin I (hsTnI) values and significant  changes across serial measurements may suggest ACS but many other  chronic and acute conditions are known to elevate hsTnI results.  Refer to the "Links" section for chest pain algorithms and additional  guidance. Performed at [facility], [place] [address] [place], [place] [address]    *Note: Due to a large number of results and/or encounters for the requested time period, some results have not been displayed. A complete set of results can be found in Results Review.   DG Chest 2 View  Result Date: 11/11/2022 CLINICAL DATA:  Shortness of breath EXAM: CHEST - 2 VIEW COMPARISON:  11/07/2022, CT 08/05/2021, 09/20/2021, chest x-ray 05/15/2021 FINDINGS: Post sternotomy changes. Cardiomegaly. No pleural effusion or pneumothorax. Mild diffuse interstitial opacity which may be secondary to low-grade edema, overall aeration is improved compared to the radiograph from 03/23. IMPRESSION: Cardiomegaly with mild diffuse interstitial opacity which may be secondary to low-grade edema but overall aeration is improved as compared with 11/07/2022. Electronically Signed   By: Donavan Foil M.D.   On: 11/11/2022 21:58    Pending Labs Unresulted Labs (From admission, onward)     Start     Ordered   11/12/22 0500  CBC  Tomorrow morning,   R        11/12/22 0245   11/12/22 [id number]  Basic metabolic panel  Tomorrow morning,   R        11/12/22 0245            Vitals/Pain Today's Vitals   11/12/22 0615 11/12/22 0630 11/12/22 0637 11/12/22 0645  BP: (!) 98/57 (!) 87/76  92/67  Pulse: 93 (!) 102  (!) 103  Resp: 17 19  (!) 24  Temp:   97.8 F (36.6 C)   TempSrc:   Oral   SpO2: 96% 97%  99%  PainSc:        Isolation Precautions No active isolations  Medications Medications  azaTHIOprine (IMURAN) tablet 125 mg (has no administration in time range)  apixaban (ELIQUIS) tablet 5 mg (has no administration in time range)  isosorbide mononitrate (IMDUR) 24 hr tablet 30 mg (has no administration in time range)  lamoTRIgine (LAMICTAL) tablet 25 mg (has no administration in time range)  oxyCODONE (Oxy IR/ROXICODONE) immediate release tablet 10 mg (has no administration in time range)  ranolazine (RANEXA) 12 hr tablet 1,000 mg (has no  administration in time range)  rosuvastatin (CRESTOR) tablet 20 mg (has no administration in time range)  sertraline (ZOLOFT) tablet 200 mg (has no administration in time range)  predniSONE (DELTASONE) tablet 5 mg (has no administration in time range)  pantoprazole (PROTONIX) EC tablet 40 mg (has no administration in time range)  metoprolol tartrate (LOPRESSOR) tablet 50 mg (has no administration in time range)  montelukast (SINGULAIR) tablet 10 mg (has no administration in  time range)  multivitamin with minerals tablet 1 tablet (has no administration in time range)  LORazepam (ATIVAN) tablet 1 mg (has no administration in time range)  empagliflozin (JARDIANCE) tablet 10 mg (has no administration in time range)  gabapentin (NEURONTIN) capsule 100 mg (has no administration in time range)  fluticasone (FLONASE) 50 MCG/ACT nasal spray 2 spray (has no administration in time range)  Basaglar KwikPen KwikPen 12 Units (has no administration in time range)  glimepiride (AMARYL) tablet 4 mg (has no administration in time range)  clopidogrel (PLAVIX) tablet 75 mg (has no administration in time range)  insulin aspart (novoLOG) injection 0-9 Units (has no administration in time range)  insulin aspart (novoLOG) injection 3 Units (has no administration in time range)  albuterol (PROVENTIL) (2.5 MG/3ML) 0.083% nebulizer solution 2.5 mg (has no administration in time range)  budesonide (PULMICORT) nebulizer solution 0.5 mg (has no administration in time range)  arformoterol (BROVANA) nebulizer solution 15 mcg (has no administration in time range)  bisacodyl (DULCOLAX) EC tablet 5 mg (has no administration in time range)  acetaminophen (TYLENOL) tablet 650 mg (has no administration in time range)    Or  acetaminophen (TYLENOL) suppository 650 mg (has no administration in time range)  ondansetron (ZOFRAN) tablet 4 mg (has no administration in time range)    Or  ondansetron (ZOFRAN) injection 4 mg (has no  administration in time range)  oxyCODONE-acetaminophen (PERCOCET/ROXICET) 5-325 MG per tablet 1 tablet (1 tablet Oral Given 11/11/22 2232)  LORazepam (ATIVAN) tablet 1 mg (1 mg Oral Given 11/11/22 2232)    Mobility non-ambulatory     Focused Assessments Cardiac Assessment Handoff:    Lab Results  Component Value Date   CKTOTAL 90 03/30/2021   CKMB 2.5 09/18/2010   TROPONINI <0.03 04/20/2018   Lab Results  Component Value Date   DDIMER 0.93 (H) 08/13/2021   Does the Patient currently have chest pain? No   , Pulmonary Assessment Handoff:  Lung sounds:   O2 Device: Nasal Cannula O2 Flow Rate (L/min): 7 L/min    R

## 2022-11-12 NOTE — Assessment & Plan Note (Signed)
Cont eliquis and metoprolol 

## 2022-11-12 NOTE — Assessment & Plan Note (Addendum)
Hold metformin Cont jardiance, amaryl Cont home lantus 12 Cont mealtime 3u novolog Cont sensitive SSI AC

## 2022-11-12 NOTE — Assessment & Plan Note (Signed)
AKI of transplanted kidney. Suspect pre-renal / ATN in setting of soft BPs over the past 24h which in turn are probably related to the heavy diuresis she recently had. Hold lasix Strict intake and output Repeat BMP in AM Dont really want to hold any of the other BP meds though as she just had an NSTEMI this past week. Continue imuran and prednisone

## 2022-11-12 NOTE — H&P (Signed)
History and Physical    Patient: Katherine [name] [id number] DOB: 05-21-1956 DOA: 11/11/2022 DOS: the patient was seen and examined on 11/12/2022 PCP: [name], MD  Patient coming from: Home  Chief Complaint:  Chief Complaint  Patient presents with   Shortness of Breath   HPI: [name] is a 67 y.o. female with medical history significant of COPD, combined CHF, OSA, chronic hypoxic respiratory failure on chronic oxygen, CAD, ESRD s/p renal transplant, Afib on eliquis.  Pt with recent admission from 3/22-3/27 for NSTEMI / acute on chronic combined CHF.  Troponin 27 and trended up to 428.  Cardiology consulted, pt not felt to be candidate for LHC due to vascular access issues and transplanted kidney.  TTE with LVEF of 35 to 40%, dyskinesis of mid to distal inferior septal wall and entire apex. Patient was diuresed with IV Lasix and cleared for discharge by cardiology on p.o. Lasix 40 mg twice daily.  [name] was initially added but discontinued on discharge due to soft blood pressures.  Imdur was reduced to 30 mg daily as well.  Therapy recommended SNF but patient chose to go home with home health and DME.  Pt discharged home around 7pm last evening.  Unfortunately didn't do well at all after EMS got to her house.  She became severely SOB just going up the front stairs and pt brought back in to ED.  Pt now asymptomatic at rest in bed.  Now willing to go to SNF.   Review of Systems: As mentioned in the history of present illness. All other systems reviewed and are negative. Past Medical History:  Diagnosis Date   Anemia    Anxiety    Bilateral carotid artery stenosis    Carotid duplex [id number]: [id number] LICA, [id number] RICA, [id number] RECA, f/u 1 yr suggested   CAD (coronary artery disease) of bypass graft 5/01; 3/'02, 8/'03, 10/'04; 1/15   PCI x 5 to SVG-D1    CAD in native artery 07/1993   3 Vessel Disease (LAD-D1 & RCA) -- CABG (Dx in setting of inferior STEMI-PTCA of RCA)    COPD mixed type ([place])    Followed by Dr. Lamonte Sakai "pulmonologist said no COPD"   Depression with anxiety    Diabetes mellitus type 2 in obese ([place])    Diarrhea    started after cholecystectomy and mass removed from intestine   Dyslipidemia, goal LDL below 70    08/2012: TC 137, TG 200, HDL 32!, LDL 45; on statin (followed by Dr.Deterding)   ESRD (end stage renal disease) ([place]) 1991   s/p Cadaveric Renal Transplant [facility] - Dr. 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  Had having having lots of nausea and vomiting with poor p.o. intake.  Thought to have QT prolongation with multiple medications involved and hypomagnesemia, hypokalemia.  Tikosyn discontinued along with Zoloft and Phenergan.   Hypertension associated with diabetes ([place])    Mild aortic stenosis by prior echocardiogram 07/2019   Echo:  Mild aortic stenosis (gradients: Mean 14.3 mmHg -peak 24.9 mmHg).  Morbid obesity (HCC)    MRSA (methicillin resistant staph aureus) culture positive    OSA (obstructive sleep apnea)    no longer on CPAP or home O2, states she doesn't need now after lap band   PAD (peripheral artery disease) ([place]) 08/2013   LEA Dopplers to be read by Dr. Fletcher Anon   PAF (paroxysmal atrial fibrillation) ([place]) 06/2014   Noted on CardioNet Monitor  - --> rhythm control with Tikosyn (Dr. Rayann Heman); converted from warfarin to apixaban for anticoagulation.   Pneumonia    Recurrent boils    Bilateral Groin   Rheumatoid arthritis ([place])    Per Patient Report; associated with OA   S/p cadaver renal transplant 1991   [facility]   Past  Surgical History:  Procedure Laterality Date   ABDOMINAL AORTOGRAM N/A 04/21/2018   Procedure: ABDOMINAL AORTOGRAM;  Surgeon: [name], MD;  Location: [place] [facility];  Service: Cardiovascular;  Laterality: N/A;   CATHETER REMOVAL     CHOLECYSTECTOMY N/A 10/29/2014   Procedure: LAPAROSCOPIC CHOLECYSTECTOMY WITH INTRAOPERATIVE CHOLANGIOGRAM;  Surgeon: Excell Seltzer, MD;  Location: [facility];  Service: General;  Laterality: N/A;   CORONARY ANGIOPLASTY  1994   x5   CORONARY ARTERY BYPASS GRAFT  1995   LIMA-LAD, SVG-RPDA, SVG-D1   CORONARY STENT INTERVENTION N/A 02/20/2021   Procedure: PERCUTANEOUS CORONARY STENT INTERVENTION;  Surgeon: [name], MD;  Location: [place] [facility]; ostLCx 60% (Neg RFR 0.96);; SVG- D2 recurrent 90% ISR & 95% native D2 after graft-> DES PCI of 95% anastomotic D2 lesion (Onyx Frontier 2.25 mm x 12 mm => 2.75 mm @ overlap, 2.5 distal.);PTCA of ISR in body of graft. ->  2.5 mm scoring balloon & post-dil w/ 2.75 mm Hope balloon   ESOPHAGOGASTRODUODENOSCOPY N/A 10/15/2016   Procedure: ESOPHAGOGASTRODUODENOSCOPY (EGD);  Surgeon: Wilford Corner, MD;  Location: [facility] ENDOSCOPY;  Service: Endoscopy;  Laterality: N/A;   I & D EXTREMITY Right 01/29/2018   Procedure: IRRIGATION AND DEBRIDEMENT THUMB;  Surgeon: Dayna Barker, MD;  Location: [place];  Service: Plastics;  Laterality: Right;   INCISE AND DRAIN ABCESS     INTRAVASCULAR PRESSURE WIRE/FFR STUDY N/A 02/20/2021   Procedure: INTRAVASCULAR PRESSURE WIRE/FFR STUDY;  Surgeon: [name], MD;  Location: [place] [facility];  Service: Cardiovascular;  Laterality: N/A;   KIDNEY TRANSPLANT  1991   KNEE ARTHROSCOPY WITH LATERAL MENISECTOMY Left 12/03/2017   Procedure: LEFT KNEE ARTHROSCOPY WITH LATERAL MENISECTOMY;  Surgeon: Earlie Server, MD;  Location: [place];  Service: Orthopedics;  Laterality: Left;   LAPAROSCOPIC GASTRIC BANDING  04/2004; 10/'09, 2/'10   Port Replacement x 2   LEFT HEART CATH AND  CORONARY ANGIOGRAPHY N/A 02/20/2021   Procedure: LEFT HEART CATH AND CORONARY ANGIOGRAPHY;  Surgeon: [name], MD;  Location: [place] [facility];  Service: Cardiovascular;  Laterality: N/A;   LEFT HEART CATH AND CORS/GRAFTS ANGIOGRAPHY N/A 04/21/2018   Procedure: LEFT HEART CATH AND CORS/GRAFTS ANGIOGRAPHY;  Surgeon: [name], MD;  Location: [place] [facility];  Ost-Prox LAD 50% - proxLAD (pre & post D1) 100% CTO. Cx - patent, small OM1 (stable ~ ostial OM1 90%, too small for PCI) & 2 small LPL; Ost-distal RCA 100% CTO.  LIMA-LAD (not injected); SVG-dRCA patent, SVG-D1 - insertion stent ~20% ISR - Severe R CFA disease w/ focal Sub TO   LEFT HEART CATH AND CORS/GRAFTS ANGIOGRAPHY  5/'01, 3/'02, 8/'03, 10/'04; 1/'15   08/22/2013: LAD & RCA 100%; LIMA-LAD & SVG-rPDA patent; Cx--  OM1 60%, OM2 ostial ~50%; SVG-D1 - 80% mid, 50% distal ISR --PCI   LEFT HEART CATH AND CORS/GRAFTS ANGIOGRAPHY N/A 01/31/2021   Procedure: LEFT HEART CATH AND CORS/GRAFTS ANGIOGRAPHY;  Surgeon: Jolaine Artist, MD;  Location: [place] [facility] LAB;;   LEFT HEART CATH AND CORS/GRAFTS ANGIOGRAPHY N/A 09/05/2021   Procedure: LEFT HEART CATH AND CORS/GRAFTS ANGIOGRAPHY;  Surgeon: [name], MD;  Location: [place] [facility];  Service: Cardiovascular;  Laterality: N/A;   LEFT HEART CATH AND CORS/GRAFTS ANGIOGRAPHY N/A 08/21/2021   Procedure: LEFT HEART CATH AND CORS/GRAFTS ANGIOGRAPHY;  Surgeon: Belva Crome, MD;  Location: [place] [facility];  Service: Cardiovascular;  Laterality: N/A;   LEFT HEART CATHETERIZATION WITH CORONARY/GRAFT ANGIOGRAM N/A 08/23/2013   Procedure: LEFT HEART CATHETERIZATION WITH Beatrix Fetters;  Surgeon: Wellington Hampshire, MD;  Location: [place] CATH LAB;  Service: Cardiovascular;  Laterality: N/A;   Lower Extremity Arterial Dopplers  08/2013   ABI: R 0.96, L 1.04   MULTIPLE TOOTH EXTRACTIONS  age 7   NM MYOVIEW LTD  03/2016   EF 62%. LOW RISK. C/W prior MI - no Ischemia. Apical  hypokinesis.   PERCUTANEOUS CORONARY STENT INTERVENTION (PCI-S)  5/'01, 3/'02, 8/'03, 10/'04;   '01 - S660 BMS 2.5 x 9 - dSVG-D1 into D1; '02- post-stent stenosis - 2.5 x 8 Pixel BMS; '8\03: ISR/Thrombosis into native D1 - AngioJet, 2.5 x 13 Pixel; '04 - ISR 95% - covered stented area with Taxus DES 2.5 mm x 20 (2.88)   PERCUTANEOUS CORONARY STENT INTERVENTION (PCI-S)  08/23/2013   Procedure: PERCUTANEOUS CORONARY STENT INTERVENTION (PCI-S);  Surgeon: Wellington Hampshire, MD;  Location: [facility] CATH LAB;  Service: Cardiovascular;;mid SVG-D1 80%; distal stent ~50% ISR; Promus Prermier DES 2.75 mm xc 20 mm (2.8 mm)   PORT-A-CATH REMOVAL     kidney   TRANSTHORACIC ECHOCARDIOGRAM  07/2019   a) 07/2019: EF 55 to 60%.  No LVH.  Paradoxical septal WM-s/p CABG.  GRII DD.  Nl RV size and fxn.  Mild bilateral atrial dilation.  Mod MAC.  Trace MR.  Mild AS (gradients: Mean 14.3 mmHg -peak 24.9 mmHg).; B) 06/2020: EF 40 to 45%.  Moderate concentric LVH.  GRII DD.  Elevated LAP.  Mod HK mid Apical Ant-AntSept wall & mild Apical Dyskinesis.  Mod LA dilation.  Mild MR.  AoV sclerosis w/o AS.   TRANSTHORACIC ECHOCARDIOGRAM  01/30/2021   EF 55 to 60%.  Mild LVH.  GR 1 DD.  Elevated LAP.  Moderate LA dilation.  Mild MR with mild MS.  Mild aortic valve stenosis.:   TUBAL LIGATION     wrist fistula repair Left    dialysis for one year   Social History:  reports that she quit smoking about 20 years ago. Her smoking use included cigarettes. She has a 30.00 pack-year smoking history. She has never used smokeless tobacco. She reports that she does not drink alcohol and does not use drugs.  Allergies  Allergen Reactions   Tetracycline Hives    Patient tolerated Doxycycline Dec 2020   Niacin Other (See Comments)    Mouth blisters   Niaspan [Niacin Er] Other (See Comments)    Mouth blisters   Sulfa Antibiotics Nausea Only and Other (See Comments)    "Tears up stomach"   Sulfonamide Derivatives Other (See Comments)     Reaction: per patient "tears her stomach up"   Codeine Nausea And Vomiting   Erythromycin Nausea And Vomiting   Hydromorphone Hcl Nausea  And Vomiting   Morphine And Related Nausea And Vomiting   Nalbuphine Nausea And Vomiting    Nubain   Sulfasalazine Nausea Only and Other (See Comments)    per patient "tears her stomach up", "Tears up stomach"   Tape Rash and Other (See Comments)    No "plastic" tape," please----cloth tape only    Family History  Problem Relation Age of Onset   Cancer Mother        liver   Heart disease Father    Cancer Father        colon   Arrhythmia Brother        Atrial Fibrillation   Arrhythmia Paternal Aunt        Atrial Fibrillation    Prior to Admission medications   Medication Sig Start Date End Date Taking? Authorizing Provider  albuterol (VENTOLIN HFA) 108 (90 Base) MCG/ACT inhaler TAKE 2 PUFFS BY MOUTH EVERY 6 HOURS AS NEEDED FOR WHEEZE OR SHORTNESS OF BREATH Patient taking differently: Inhale 2 puffs into the lungs every 6 (six) hours as needed for shortness of breath. 07/30/22   Collene Gobble, MD  arformoterol (BROVANA) 15 MCG/2ML NEBU Take 2 mLs (15 mcg total) by nebulization 2 (two) times daily. 08/30/19   Collene Gobble, MD  azaTHIOprine (IMURAN) 50 MG tablet Take 2 & 1/2 tablets (125 mg) by mouth daily at 3pm Patient taking differently: Take 125 mg by mouth daily. 10/21/21   Pokhrel, Corrie Mckusick, MD  bisacodyl (DULCOLAX) 5 MG EC tablet Take 1 tablet (5 mg total) by mouth daily as needed for moderate constipation. 10/05/21   Allie Bossier, MD  budesonide (PULMICORT) 0.5 MG/2ML nebulizer solution Take 2 mLs (0.5 mg total) by nebulization 2 (two) times daily. Patient taking differently: Take 2 mLs by nebulization 2 (two) times daily as needed (wheezing). 08/24/19   [name], MD  calcitRIOL (ROCALTROL) 0.25 MCG capsule Take 0.25 mcg by mouth See admin instructions. Takes every 3 days.    [provider]  clopidogrel (PLAVIX) 75 MG tablet  Take 1 tablet (75 mg total) by mouth daily. 11/11/22 01/10/23  Dessa Phi, DO  Continuous Blood Gluc Receiver (FREESTYLE LIBRE 2 READER) DEVI 1 Act by Does not apply route daily. 06/17/22   [name], MD  Continuous Blood Gluc Sensor (FREESTYLE LIBRE 2 SENSOR) MISC 1 Act by Does not apply route daily. 06/17/22   [name], MD  CVS VITAMIN C 500 MG tablet TAKE 1 TABLET (500 MG TOTAL) BY MOUTH DAILY. Patient taking differently: Take 500 mg by mouth daily. 02/03/22   [name], MD  cyclobenzaprine (FLEXERIL) 10 MG tablet TAKE 1 TABLET BY MOUTH TWICE A DAY AS NEEDED FOR MUSCLE SPASMS Patient taking differently: Take 10 mg by mouth daily as needed for muscle spasms. 09/14/22   [name], MD  ELIQUIS 5 MG TABS tablet TAKE 1 TABLET BY MOUTH TWICE A DAY 10/22/22   [name], MD  empagliflozin (JARDIANCE) 10 MG TABS tablet Take 1 tablet (10 mg total) by mouth daily. 11/11/22 01/10/23  Dessa Phi, DO  Ensure Max Protein (ENSURE MAX PROTEIN) LIQD Take 330 mLs (11 oz total) by mouth 2 (two) times daily. 12/12/21   Mariel Aloe, MD  fluticasone (FLONASE) 50 MCG/ACT nasal spray PLACE 2 SPRAYS INTO BOTH NOSTRILS 2 TIMES DAILY. 08/04/22   [name], MD  furosemide (LASIX) 40 MG tablet Take 1 tablet (40 mg total) by mouth 2 (two) times  daily. 11/10/22 01/09/23  Dessa Phi, DO  gabapentin (NEURONTIN) 100 MG capsule Take 1 capsule (100 mg total) by mouth at bedtime. Patient taking differently: Take 100 mg by mouth daily as needed (For pain). 09/11/21   Hosie Poisson, MD  glimepiride (AMARYL) 4 MG tablet Take 4 mg by mouth daily with breakfast.    [provider]  insulin aspart (NOVOLOG FLEXPEN) 100 UNIT/ML FlexPen Inject 4-12 Units into the skin 3 (three) times daily with meals. Sliding scale.    [provider]  Insulin Glargine (BASAGLAR KWIKPEN) 100 UNIT/ML Inject 30 Units into the skin daily. Patient taking differently: Inject 12 Units into the skin daily.  06/17/22   [name], MD  Insulin Pen Needle (PEN NEEDLES) 32G X 4 MM MISC Use as needed to administer insulin. E11.9 02/01/22   [name], MD  isosorbide mononitrate (IMDUR) 30 MG 24 hr tablet Take 1 tablet (30 mg total) by mouth daily. 11/12/22   Mercy Riding, MD  KLOR-CON M10 10 MEQ tablet TAKE 1 TABLET BY MOUTH 2 TIMES DAILY. 09/19/22   [name], MD  lamoTRIgine (LAMICTAL) 25 MG tablet TAKE 1 TABLET BY MOUTH EVERYDAY AT BEDTIME Patient taking differently: Take 25 mg by mouth daily. 02/19/22   Meredith Staggers, MD  LORazepam (ATIVAN) 1 MG tablet Take 1 tablet (1 mg total) by mouth every 8 (eight) hours as needed for anxiety. 09/26/22   [name], MD  metFORMIN (GLUCOPHAGE) 500 MG tablet Take 500 mg by mouth 2 (two) times daily. 12/08/21   [provider]  metoprolol tartrate (LOPRESSOR) 50 MG tablet Take 1 tablet (50 mg total) by mouth 2 (two) times daily. 11/10/22 01/09/23  Dessa Phi, DO  montelukast (SINGULAIR) 10 MG tablet TAKE 1 TABLET BY MOUTH EVERYDAY AT BEDTIME Patient taking differently: Take 10 mg by mouth at bedtime. 05/04/22   Collene Gobble, MD  Multiple Vitamin (MULTIVITAMIN WITH MINERALS) TABS tablet Take 1 tablet by mouth daily. 12/13/21   Mariel Aloe, MD  nitroGLYCERIN (NITROSTAT) 0.4 MG SL tablet PLACE 1 TAB UNDER TONGUE EVERY 5MINS FOR CHEST PAIN Patient taking differently: Place 0.4 mg under the tongue every 5 (five) minutes as needed for chest pain. 06/30/22   [name], MD  Oxycodone HCl 10 MG TABS Take 1 tablet (10 mg total) by mouth every 8 (eight) hours as needed. Patient taking differently: Take 10 mg by mouth every 8 (eight) hours as needed (For pain). 11/04/22   [name], MD  OXYGEN Inhale 4 L into the lungs continuous.    [provider]  pantoprazole (PROTONIX) 40 MG tablet TAKE 1 TABLET BY MOUTH EVERY DAY 08/05/22   [name], MD  predniSONE (DELTASONE) 5 MG tablet Take 5 mg by mouth daily with breakfast.     [provider]  promethazine (PHENERGAN) 25 MG tablet Take 1 tablet (25 mg total) by mouth every 6 (six) hours as needed for nausea or vomiting. 05/20/22   Haydee Salter, MD  ranolazine (RANEXA) 1000 MG SR tablet TAKE 1 TABLET BY MOUTH TWICE A DAY 10/31/22   [name], MD  rosuvastatin (CRESTOR) 20 MG tablet TAKE 1 TABLET BY MOUTH EVERY DAY 07/29/22   [name], MD  sertraline (ZOLOFT) 100 MG tablet Take 2 tablets (200 mg total) by mouth daily. 06/16/22   [name], MD    Physical Exam: Vitals:   11/12/22 0030 11/12/22 0100 11/12/22 0149  11/12/22 0230  BP: 94/65 92/64  100/63  Pulse: 99 88  98  Resp: 20 18  20   Temp:   97.8 F (36.6 C)   TempSrc:   Oral   SpO2: 91% 94%  94%   Constitutional: NAD, calm, comfortable Respiratory: clear to auscultation bilaterally, no wheezing, no crackles. Normal respiratory effort. No accessory muscle use.  Cardiovascular: Regular rate and rhythm, no murmurs / rubs / gallops. No extremity edema. 2+ pedal pulses. No carotid bruits.  Abdomen: no tenderness, no masses palpated. No hepatosplenomegaly. Bowel sounds positive.  Skin: Sacral decubitus  Data Reviewed:       Latest Ref Rng & Units 11/11/2022    9:12 PM 11/10/2022    2:49 AM 11/09/2022    2:22 AM  CMP  Glucose 70 - 99 mg/dL 218  172  271   BUN 8 - 23 mg/dL 30  12  14    Creatinine 0.44 - 1.00 mg/dL 1.48  0.88  0.93   Sodium 135 - 145 mmol/L 131  132  130   Potassium 3.5 - 5.1 mmol/L 4.6  3.4  4.1   Chloride 98 - 111 mmol/L 91  94  96   CO2 22 - 32 mmol/L 27  28  25    Calcium 8.9 - 10.3 mg/dL 8.9  8.4  8.1       Latest Ref Rng & Units 11/11/2022    9:12 PM 11/09/2022    2:22 AM 11/07/2022   11:32 PM  CBC  WBC 4.0 - 10.5 K/uL 7.1  8.6  7.7   Hemoglobin 12.0 - 15.0 g/dL 9.6  9.8  9.6   Hematocrit 36.0 - 46.0 % 29.2  30.1  28.6   Platelets 150 - 400 K/uL 208  198  227      Assessment and Plan: * Dyspnea on exertion Due to recent NSTEMI and acute on  chronic combined CHF. Not really able to diurese any further I dont think given soft BPs today and now AKI developed over the past 24h it looks like based on lab work. Plan at this point it DC to SNF which pt is now willing to go to.  Acute kidney injury superimposed on CKD ([place]) AKI of transplanted kidney. Suspect pre-renal / ATN in setting of soft BPs over the past 24h which in turn are probably related to the heavy diuresis she recently had. Hold lasix Strict intake and output Repeat BMP in AM Dont really want to hold any of the other BP meds though as she just had an NSTEMI this past week. Continue imuran and prednisone  Paroxysmal atrial fibrillation ([place]); CHA2DS2VASc score F, HTN, CAD, CVA = 5 Cont eliquis and metoprolol  CAD (coronary artery disease) of bypass graft Just had NSTEMI this past week. Cont Imdur, ranexa, lopressor, eliquis. EDP d/w cards who didn't have any further recommendations today as far as heparin gtt etc Does NOT sound like she is a candidate for LHC at this stage (see prior cards notes).  Type 2 diabetes mellitus with hyperlipidemia (HCC) Hold metformin Cont jardiance, amaryl Cont home lantus 12 Cont mealtime 3u novolog Cont sensitive SSI AC      Advance Care Planning:   Code Status: DNR DNI  Consults: EDP d/w cardiology  Family Communication: No family in room  Severity of Illness: The appropriate patient status for this patient is OBSERVATION. Observation status is judged to be reasonable and necessary in order to provide the required intensity of service to ensure  the patient's safety. The patient's presenting symptoms, physical exam findings, and initial radiographic and laboratory data in the context of their medical condition is felt to place them at decreased risk for further clinical deterioration. Furthermore, it is anticipated that the patient will be medically stable for discharge from the hospital within 2 midnights of admission.    Author: Etta Quill., DO 11/12/2022 3:26 AM  For on call review [email or web address].

## 2022-11-13 ENCOUNTER — Other Ambulatory Visit (HOSPITAL_COMMUNITY): Payer: Self-pay

## 2022-11-13 DIAGNOSIS — N179 Acute kidney failure, unspecified: Secondary | ICD-10-CM | POA: Diagnosis not present

## 2022-11-13 DIAGNOSIS — I25709 Atherosclerosis of coronary artery bypass graft(s), unspecified, with unspecified angina pectoris: Secondary | ICD-10-CM | POA: Diagnosis not present

## 2022-11-13 DIAGNOSIS — R0609 Other forms of dyspnea: Secondary | ICD-10-CM | POA: Diagnosis not present

## 2022-11-13 DIAGNOSIS — I959 Hypotension, unspecified: Secondary | ICD-10-CM | POA: Diagnosis not present

## 2022-11-13 LAB — GLUCOSE, CAPILLARY
Glucose-Capillary: 142 mg/dL — ABNORMAL HIGH (ref 70–99)
Glucose-Capillary: 147 mg/dL — ABNORMAL HIGH (ref 70–99)
Glucose-Capillary: 184 mg/dL — ABNORMAL HIGH (ref 70–99)
Glucose-Capillary: 192 mg/dL — ABNORMAL HIGH (ref 70–99)

## 2022-11-13 LAB — CBC
HCT: 32.2 % — ABNORMAL LOW (ref 36.0–46.0)
Hemoglobin: 10.1 g/dL — ABNORMAL LOW (ref 12.0–15.0)
MCH: 31.1 pg (ref 26.0–34.0)
MCHC: 31.4 g/dL (ref 30.0–36.0)
MCV: 99.1 fL (ref 80.0–100.0)
Platelets: 220 10*3/uL (ref 150–400)
RBC: 3.25 MIL/uL — ABNORMAL LOW (ref 3.87–5.11)
RDW: 18.8 % — ABNORMAL HIGH (ref 11.5–15.5)
WBC: 5.4 10*3/uL (ref 4.0–10.5)
nRBC: 0 % (ref 0.0–0.2)

## 2022-11-13 LAB — RENAL FUNCTION PANEL
Albumin: 2.5 g/dL — ABNORMAL LOW (ref 3.5–5.0)
Anion gap: 14 (ref 5–15)
BUN: 21 mg/dL (ref 8–23)
CO2: 26 mmol/L (ref 22–32)
Calcium: 9 mg/dL (ref 8.9–10.3)
Chloride: 95 mmol/L — ABNORMAL LOW (ref 98–111)
Creatinine, Ser: 1.13 mg/dL — ABNORMAL HIGH (ref 0.44–1.00)
GFR, Estimated: 54 mL/min — ABNORMAL LOW (ref >60)
Glucose, Bld: 126 mg/dL — ABNORMAL HIGH (ref 70–99)
Phosphorus: 3.5 mg/dL (ref 2.5–4.6)
Potassium: 3.9 mmol/L (ref 3.5–5.1)
Sodium: 135 mmol/L (ref 135–145)

## 2022-11-13 LAB — MAGNESIUM: Magnesium: 1.8 mg/dL (ref 1.7–2.4)

## 2022-11-13 MED ORDER — CYCLOBENZAPRINE HCL 10 MG PO TABS
10.0000 mg | ORAL_TABLET | Freq: Three times a day (TID) | ORAL | Status: DC | PRN
  Administered 2022-11-13 – 2022-11-23 (×13): 10 mg via ORAL
  Filled 2022-11-13 (×14): qty 1

## 2022-11-13 MED ORDER — NITROGLYCERIN 0.4 MG SL SUBL
0.4000 mg | SUBLINGUAL_TABLET | SUBLINGUAL | Status: DC | PRN
  Administered 2022-11-13 – 2022-11-20 (×3): 0.4 mg via SUBLINGUAL
  Filled 2022-11-13 (×4): qty 1

## 2022-11-13 NOTE — Progress Notes (Signed)
Converted to Afib (rate controlled). Vitals stable, patient resting. Complaints of generalized pain; utilizing PRN meds. Still becomes emotional and having some anxiety about recent loss of brother and her dog that is at home.

## 2022-11-13 NOTE — Consult Note (Signed)
WOC Nurse Consult Note: Reason for Consult:chronic, nonhealing wound to left forearm with hyperkeratotic tissue and small wound in center. Wound type:healing traumatic Pressure Injury POA: N/A Measurement:Bedside RN to measure and document measurements on Nursing Flow Sheet today Wound bed:As noted above Drainage (amount, consistency, odor) None Periwound:As noted above Dressing procedure/placement/frequency: The area of hyperkeratotic tissue will likely dry and break or lift off with a three times weekly cleanse and topical care with covering with an antimicrobial nonadherent, xeroform gauze. This is to be covered with a dry gauze and secured with a few turns of conform bandaging to prevent premature or traumatic removal of the nonviable tissue. If a more rapid un roofing is desired, consider requesting surgical consult.  [place] nursing team will not follow, but will remain available to this patient, the nursing and medical teams.  Please re-consult if needed.  Thank you for inviting [place] to participate in this patient's Plan of Care.  Maudie Flakes, MSN, RN, CNS, [place], Serita Grammes, [employer], [employer] phone:  [phone]

## 2022-11-13 NOTE — Progress Notes (Signed)
Mobility Specialist Progress Note:   11/13/22 1437  Mobility  Activity Transferred from bed to chair  Level of Assistance Contact guard assist, steadying assist  Assistive Device Front wheel walker  Distance Ambulated (ft) 3 ft  Activity Response Tolerated well  Mobility Referral Yes  $Mobility charge 1 Mobility   Pt agreeable to mobility session. Declined ambulation until tomorrow. Required minG assist to transfer to chair with RW. No LOB noted. VSS On 7LO2. Pt left with all needs met, daughter in room.  Mobility team to follow up this weekend for ambulation.  Nelta Numbers Mobility Specialist Please contact via SecureChat or  Rehab office at [phone]

## 2022-11-13 NOTE — Progress Notes (Signed)
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   Patient was hospitalized from 3/22-3/27 for NSTEMI / acute on chronic combined CHF.  Troponin trended from 27-428.  Cardiology consulted, pt not felt to be candidate for LHC due to vascular access issues and transplanted kidney.  TTE with LVEF of 35 to 40%, dyskinesis of mid to distal inferior septal wall and entire apex. Patient was diuresed with IV Lasix and cleared for discharge by cardiology on p.o. Lasix 40 mg twice daily.  Katherine [name] was initially added but discontinued on discharge due to soft blood pressures.  Imdur was reduced to 30 mg daily as well.  Therapy recommended SNF but patient chose to go home with home health and DME.  Unfortunately, she didn't do well at all after EMS got to her house.  She became severely SOB just going up the front stairs and pt brought back to ED. Patient's dyspnea improved while in ED.  She had mild AKI.   Now willing to go to SNF.  TOC consulted.   Subjective: Seen and examined earlier this morning.  No major events overnight of this morning.  She is somewhat tearful about his overall health, her late brother and head dog.  Objective: Vitals:   11/13/22 1100 11/13/22 1146 11/13/22 1200 11/13/22 1300  BP:  (!) 104/55    Pulse: 75 67 70 80  Resp:  19    Temp:  98.2 F (36.8 C)    TempSrc:  Oral    SpO2: 97% 95% 93% 94%  Weight:      Height:        Examination:  GENERAL: No apparent distress.  Nontoxic. HEENT: MMM.  Vision and hearing grossly intact.  NECK: Supple.  No apparent JVD.  RESP:   No IWOB.  Fair aeration bilaterally. CVS:  RRR. Heart sounds normal.  ABD/GI/GU: BS+. Abd soft, NTND.  MSK/EXT:   No apparent deformity. Moves extremities. No edema.  SKIN: no apparent skin lesion or wound NEURO: Awake and alert. Oriented appropriately.  No apparent focal neuro deficit. PSYCH: Tearful about her health, her late brother and dog.  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 -Continue home inhalers and oxygen  Elevated troponin/CAD s/p CABG with multiple stenting:Elevated troponin likely demand ischemia -Previously evaluated by cardiology who recommended medical mgt -Continue Plavix, ranolazine, Crestor, metoprolol  -Called Imdur due to hypotension. -Outpatient follow-up with cardiology   Chronic combined CHF: TTE as above.  Diuresed with IV Lasix and discharged on p.o. Lasix previous hosp.  Now with AKI and hypotension. -Continue holding diuretics. -No room for GDMT due to hypotension -Strict intake and output, daily weight, renal  functions and electrolytes   AKI on CKD-3A/Hx of renal transplant: Improving. Recent Labs    02/02/22 1548 06/16/22 1559 11/06/22 0920 11/07/22 0125 11/07/22 2332 11/09/22 0222 11/10/22 0249 11/11/22 2112 11/12/22 0928 11/13/22 1003  BUN 20 13 12 13 15 14 12  30* 27* 21  CREATININE 0.81 0.92 1.10* 1.04* 1.10* 0.93 0.88 1.48* 1.27* 1.13*  -Continue holding diuretics -Avoid hypotension -Continue home Imuran and prednisone  Hypotension: Likely iatrogenic from BP meds, opiate and benzo.  Improved. -Discontinued  Imdur  Uncontrolled IDDM-2 with hyperglycemia: A1c 8.5% in 05/2022. Recent Labs  Lab 11/12/22 1612 11/12/22 1645 11/12/22 2119 11/13/22 0754 11/13/22 1144  GLUCAP 56* 82 212* 142* 147*  -Continue Semglee 12 units daily, NovoLog 3 units 3 times daily with meals and SSI-sensitive -Recheck hemoglobin A1c  Paroxysmal A-fib -Metoprolol, Eliquis   Left arm lesion -Has been ongoing over the past several months, has outpatient surgery referral for removal   Debility: Therapy recommended SNF but patient chose to go home with home health previous hospitalization.  Now willing to go to SNF -[facility] consulted.   Anxiety and depression: Poorly controlled. -Vraylar, Lamictal, Ativan, Zoloft   Chronic back pain, narcotic dependent: Narcotic database reviewed. -Oxycodone, Flexeril   Pressure skin injury: POA  Grief: Lost her brother recently. -Emotional support -Offered chaplain consult but waiting on her pastor.  Morbid Obesity:  Body mass index is 38.49 kg/m.  Pressure Skin Injury: POA Pressure Injury 11/06/22 Sacrum Mid;Lower Stage 2 -  Partial thickness loss of dermis presenting as a shallow open injury with a red, pink wound bed without slough. (Active)  11/06/22 2000  Location: Sacrum  Location Orientation: Mid;Lower  Staging: Stage 2 -  Partial thickness loss of dermis presenting as a shallow open injury with a red, pink wound bed without slough.  Wound Description (Comments):   Present on Admission: Yes  Dressing Type Foam - Lift dressing to assess site every shift 11/11/22 0800   DVT prophylaxis:   apixaban (ELIQUIS) tablet 5 mg  Code Status: SNR/DNI Family Communication: None at bedside Level of care: Telemetry Cardiac Status is: Observation The patient remains OBS appropriate and will d/c before 2 midnights.   Final disposition: SNF Consultants:  None  35 minutes with more than 50% spent in reviewing records, counseling patient/family and coordinating  care.   Sch Meds:  Scheduled Meds:  apixaban  5 mg Oral BID   arformoterol  15 mcg Nebulization BID   azaTHIOprine  125 mg Oral Daily   budesonide  2 mL Nebulization BID   clopidogrel  75 mg Oral Daily   empagliflozin  10 mg Oral Daily   fluticasone  2 spray Each Nare Daily   insulin aspart  0-9 Units Subcutaneous TID WC   insulin aspart  3 Units Subcutaneous TID WC   insulin glargine-yfgn  12 Units Subcutaneous Daily   lamoTRIgine  25 mg Oral QHS   metoprolol tartrate  50 mg Oral BID   montelukast  10 mg Oral QHS   multivitamin with minerals  1 tablet Oral Daily   pantoprazole  40 mg Oral Daily   predniSONE  5 mg Oral Q breakfast   ranolazine  1,000 mg Oral BID   rosuvastatin  20 mg Oral Daily   sertraline  200 mg Oral Daily   Continuous Infusions: PRN Meds:.acetaminophen **OR** acetaminophen, albuterol, bisacodyl, gabapentin, LORazepam, ondansetron **OR** ondansetron (ZOFRAN) IV, oxyCODONE  Antimicrobials: Anti-infectives (From admission, onward)    None  I have personally reviewed the following labs and images: CBC: Recent Labs  Lab 11/07/22 2332 11/09/22 0222 11/11/22 2112 11/12/22 0928 11/13/22 1003  WBC 7.7 8.6 7.1 6.8 5.4  HGB 9.6* 9.8* 9.6* 9.8* 10.1*  HCT 28.6* 30.1* 29.2* 30.8* 32.2*  MCV 93.8 96.2 96.7 97.2 99.1  PLT 227 198 208 189 220   BMP &GFR Recent Labs  Lab 11/09/22 0222 11/10/22 0249 11/11/22 2112 11/12/22 0928 11/13/22 1003  NA 130* 132* 131* 135 135  K 4.1 3.4* 4.6 4.2 3.9  CL 96* 94* 91* 95* 95*  CO2 25 28 27 26 26   GLUCOSE 271* 172* 218* 129* 126*  BUN 14 12 30* 27* 21  CREATININE 0.93 0.88 1.48* 1.27* 1.13*  CALCIUM 8.1* 8.4* 8.9 9.1 9.0  MG  --   --   --   --  1.8  PHOS  --   --   --   --  3.5   Estimated Creatinine Clearance: 51.4 mL/min (A) (by C-G formula based on SCr of 1.13 mg/dL (H)). Liver & Pancreas: Recent Labs  Lab 11/13/22 1003  ALBUMIN 2.5*   No results for input(s): "LIPASE", "AMYLASE" in the  last 168 hours. No results for input(s): "AMMONIA" in the last 168 hours. Diabetic: No results for input(s): "HGBA1C" in the last 72 hours. Recent Labs  Lab 11/12/22 1612 11/12/22 1645 11/12/22 2119 11/13/22 0754 11/13/22 1144  GLUCAP 56* 82 212* 142* 147*   Cardiac Enzymes: No results for input(s): "CKTOTAL", "CKMB", "CKMBINDEX", "TROPONINI" in the last 168 hours. No results for input(s): "PROBNP" in the last 8760 hours. Coagulation Profile: No results for input(s): "INR", "PROTIME" in the last 168 hours.  Thyroid Function Tests: No results for input(s): "TSH", "T4TOTAL", "FREET4", "T3FREE", "THYROIDAB" in the last 72 hours. Lipid Profile: No results for input(s): "CHOL", "HDL", "LDLCALC", "TRIG", "CHOLHDL", "LDLDIRECT" in the last 72 hours. Anemia Panel: No results for input(s): "VITAMINB12", "FOLATE", "FERRITIN", "TIBC", "IRON", "RETICCTPCT" in the last 72 hours. Urine analysis:    Component Value Date/Time   COLORURINE AMBER (A) 11/06/2022 1322   APPEARANCEUR CLEAR 11/06/2022 1322   LABSPEC 1.015 11/06/2022 1322   PHURINE 5.0 11/06/2022 1322   GLUCOSEU 50 (A) 11/06/2022 1322   GLUCOSEU 250 (A) 06/16/2022 1559   HGBUR NEGATIVE 11/06/2022 1322   BILIRUBINUR NEGATIVE 11/06/2022 1322   KETONESUR NEGATIVE 11/06/2022 1322   PROTEINUR NEGATIVE 11/06/2022 1322   UROBILINOGEN 0.2 06/16/2022 1559   NITRITE NEGATIVE 11/06/2022 1322   LEUKOCYTESUR NEGATIVE 11/06/2022 1322   Sepsis Labs: Invalid input(s): "PROCALCITONIN", "LACTICIDVEN"  Microbiology: No results found for this or any previous visit (from the past 240 hour(s)).  Radiology Studies: No results found.    Lashan Macias T. [place]  If 7PM-7AM, please contact night-coverage [email or web address] 11/13/2022, 1:18 PM

## 2022-11-14 ENCOUNTER — Other Ambulatory Visit: Payer: Self-pay

## 2022-11-14 ENCOUNTER — Other Ambulatory Visit: Payer: Self-pay | Admitting: Internal Medicine

## 2022-11-14 DIAGNOSIS — N179 Acute kidney failure, unspecified: Secondary | ICD-10-CM | POA: Diagnosis not present

## 2022-11-14 DIAGNOSIS — R0609 Other forms of dyspnea: Secondary | ICD-10-CM | POA: Diagnosis not present

## 2022-11-14 DIAGNOSIS — N182 Chronic kidney disease, stage 2 (mild): Secondary | ICD-10-CM

## 2022-11-14 DIAGNOSIS — I25709 Atherosclerosis of coronary artery bypass graft(s), unspecified, with unspecified angina pectoris: Secondary | ICD-10-CM | POA: Diagnosis not present

## 2022-11-14 DIAGNOSIS — I959 Hypotension, unspecified: Secondary | ICD-10-CM | POA: Diagnosis not present

## 2022-11-14 LAB — TROPONIN I (HIGH SENSITIVITY)
Troponin I (High Sensitivity): 103 ng/L (ref <18)
Troponin I (High Sensitivity): 98 ng/L — ABNORMAL HIGH (ref <18)

## 2022-11-14 LAB — GLUCOSE, CAPILLARY
Glucose-Capillary: 131 mg/dL — ABNORMAL HIGH (ref 70–99)
Glucose-Capillary: 155 mg/dL — ABNORMAL HIGH (ref 70–99)
Glucose-Capillary: 149 mg/dL — ABNORMAL HIGH (ref 70–99)
Glucose-Capillary: 220 mg/dL — ABNORMAL HIGH (ref 70–99)

## 2022-11-14 MED ORDER — ISOSORBIDE MONONITRATE ER 30 MG PO TB24
30.0000 mg | ORAL_TABLET | Freq: Every day | ORAL | Status: DC
  Administered 2022-11-14 – 2022-11-23 (×10): 30 mg via ORAL
  Filled 2022-11-14 (×10): qty 1

## 2022-11-14 NOTE — Progress Notes (Signed)
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   Patient was hospitalized from 3/22-3/27 for NSTEMI / acute on chronic combined CHF.  Troponin trended from 27-428.  Cardiology consulted, pt not felt to be candidate for LHC due to vascular access issues and transplanted kidney.  TTE with LVEF of 35 to 40%, dyskinesis of mid to distal inferior septal wall and entire apex. Patient was diuresed with IV Lasix and cleared for discharge by cardiology on p.o. Lasix 40 mg twice daily.  [name] was initially added but discontinued on discharge due to soft blood pressures.  Imdur was reduced to 30 mg daily as well.  Therapy recommended SNF but patient chose to go home with home health and DME.  Unfortunately, she didn't do well at all after EMS got to her house.  She became severely SOB just going up the front stairs and pt brought back to ED. Patient's dyspnea improved while in ED.  She had mild AKI.   Now willing to go to SNF.  TOC consulted.   Subjective: Seen and examined earlier this morning.  Had an appendectomy out of chest pain last night.  Troponin slightly elevated but lower than previous values.  Currently chest pain-free.  Feels sleepy this morning.  She states she did not go to bed until earlier this morning.  No other complaints.  Objective: Vitals:   11/14/22 0438 11/14/22 0830 11/14/22 0900 11/14/22 1236  BP: 106/77 128/71  110/65  Pulse: 76 80  75  Resp: 19   18  Temp: 98.4 F (36.9 C)   98.1 F (36.7 C)  TempSrc: Oral   Oral  SpO2: 93%  92%   Weight:      Height:         Examination: GENERAL: No apparent distress.  Nontoxic. HEENT: MMM.  Vision and hearing grossly intact.  NECK: Supple.  No apparent JVD.  RESP:  No IWOB.  Fair aeration bilaterally. CVS:  RRR. Heart sounds normal.  ABD/GI/GU: BS+. Abd soft, NTND.  MSK/EXT:   No apparent deformity. Moves extremities. No edema.  SKIN: no apparent skin lesion or wound NEURO: Awake and alert. Oriented appropriately.  No apparent focal neuro deficit. PSYCH: Calm. Normal affect.   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 -Continue home inhalers and oxygen  Chest pain/elevated troponin/CAD s/p CABG with multiple stenting:Elevated troponin likely demand ischemia -Previously evaluated by cardiology who recommended medical mgt -Continue Plavix, ranolazine, Crestor, metoprolol  -Resume Imdur now blood pressure improved. -Outpatient follow-up with cardiology   Chronic combined CHF: TTE as above.  Diuresed with IV Lasix and discharged on p.o. Lasix previous hosp.  Now with  AKI and hypotension. -Continue holding diuretics. -GDMT limited by soft blood pressures. -Strict intake and output, daily weight, renal functions and electrolytes   AKI on CKD-3A/Hx of renal transplant: Improving. Recent Labs    02/02/22 1548 06/16/22 1559 11/06/22 0920 11/07/22 0125 11/07/22 2332 11/09/22 0222 11/10/22 0249 11/11/22 2112 11/12/22 0928 11/13/22 1003  BUN 20 13 12 13 15 14 12  30* 27* 21  CREATININE 0.81 0.92 1.10* 1.04* 1.10* 0.93 0.88 1.48* 1.27* 1.13*  -Continue holding diuretics -Avoid  hypotension -Continue home Imuran and prednisone  Hypotension: Likely iatrogenic from BP meds, opiate and benzo.  Resolved.  Uncontrolled IDDM-2 with hyperglycemia: A1c 8.5% in 05/2022. Recent Labs  Lab 11/13/22 1144 11/13/22 1605 11/13/22 2115 11/14/22 0753 11/14/22 1242  GLUCAP 147* 184* 192* 131* 155*  -Continue Semglee 12 units daily, NovoLog 3 units 3 times daily with meals and SSI-sensitive -Recheck hemoglobin A1c  Paroxysmal A-fib -Metoprolol, Eliquis   Left arm lesion -Has been ongoing over the past several months, has outpatient surgery referral for removal   Debility: Therapy recommended SNF but patient chose to go home with home health previous hospitalization.  Now willing to go to SNF -[facility] consulted.   Anxiety and depression: Poorly controlled. -Vraylar, Lamictal, Ativan, Zoloft   Chronic back pain, narcotic dependent: Narcotic database reviewed. -Oxycodone, Flexeril  Grief: Lost her brother recently. -Emotional support -Offered chaplain consult but waiting on her pastor.  Morbid Obesity:  Body mass index is 38.49 kg/m.  Pressure Skin Injury: POA Pressure Injury 11/06/22 Sacrum Mid;Lower Stage 2 -  Partial thickness loss of dermis presenting as a shallow open injury with a red, pink wound bed without slough. (Active)  11/06/22 2000  Location: Sacrum  Location Orientation: Mid;Lower  Staging: Stage 2 -  Partial thickness loss of dermis presenting as a shallow open injury with a red, pink wound bed without slough.  Wound Description (Comments):   Present on Admission: Yes  Dressing Type Foam - Lift dressing to assess site every shift 11/11/22 0800   DVT prophylaxis:   apixaban (ELIQUIS) tablet 5 mg  Code Status: SNR/DNI Family Communication: None at bedside Level of care: Telemetry Cardiac Status is: Inpatient The patient will remain inpatient because: Safe disposition   Final disposition: SNF Consultants:  None  35 minutes with more than 50%  spent in reviewing records, counseling patient/family and coordinating care.   Sch Meds:  Scheduled Meds:  apixaban  5 mg Oral BID   arformoterol  15 mcg Nebulization BID   azaTHIOprine  125 mg Oral Daily   budesonide  2 mL Nebulization BID   clopidogrel  75 mg Oral Daily   empagliflozin  10 mg Oral Daily   fluticasone  2 spray Each Nare Daily   insulin aspart  0-9 Units Subcutaneous TID WC   insulin aspart  3 Units Subcutaneous TID WC   insulin glargine-yfgn  12 Units Subcutaneous Daily   lamoTRIgine  25 mg Oral QHS   metoprolol tartrate  50 mg Oral BID   montelukast  10 mg Oral QHS   multivitamin with minerals  1 tablet Oral Daily   pantoprazole  40 mg Oral Daily   predniSONE  5 mg Oral Q breakfast   ranolazine  1,000 mg Oral BID   rosuvastatin  20 mg Oral Daily   sertraline  200 mg Oral Daily   Continuous Infusions: PRN Meds:.acetaminophen **OR** acetaminophen, albuterol, bisacodyl, cyclobenzaprine, gabapentin, LORazepam, nitroGLYCERIN, ondansetron **OR** ondansetron (ZOFRAN) IV, oxyCODONE  Antimicrobials: Anti-infectives (From admission, onward)  None        I have personally reviewed the following labs and images: CBC: Recent Labs  Lab 11/07/22 2332 11/09/22 0222 11/11/22 2112 11/12/22 0928 11/13/22 1003  WBC 7.7 8.6 7.1 6.8 5.4  HGB 9.6* 9.8* 9.6* 9.8* 10.1*  HCT 28.6* 30.1* 29.2* 30.8* 32.2*  MCV 93.8 96.2 96.7 97.2 99.1  PLT 227 198 208 189 220   BMP &GFR Recent Labs  Lab 11/09/22 0222 11/10/22 0249 11/11/22 2112 11/12/22 0928 11/13/22 1003  NA 130* 132* 131* 135 135  K 4.1 3.4* 4.6 4.2 3.9  CL 96* 94* 91* 95* 95*  CO2 25 28 27 26 26   GLUCOSE 271* 172* 218* 129* 126*  BUN 14 12 30* 27* 21  CREATININE 0.93 0.88 1.48* 1.27* 1.13*  CALCIUM 8.1* 8.4* 8.9 9.1 9.0  MG  --   --   --   --  1.8  PHOS  --   --   --   --  3.5   Estimated Creatinine Clearance: 51.4 mL/min (A) (by C-G formula based on SCr of 1.13 mg/dL (H)). Liver &  Pancreas: Recent Labs  Lab 11/13/22 1003  ALBUMIN 2.5*   No results for input(s): "LIPASE", "AMYLASE" in the last 168 hours. No results for input(s): "AMMONIA" in the last 168 hours. Diabetic: No results for input(s): "HGBA1C" in the last 72 hours. Recent Labs  Lab 11/13/22 1144 11/13/22 1605 11/13/22 2115 11/14/22 0753 11/14/22 1242  GLUCAP 147* 184* 192* 131* 155*   Cardiac Enzymes: No results for input(s): "CKTOTAL", "CKMB", "CKMBINDEX", "TROPONINI" in the last 168 hours. No results for input(s): "PROBNP" in the last 8760 hours. Coagulation Profile: No results for input(s): "INR", "PROTIME" in the last 168 hours.  Thyroid Function Tests: No results for input(s): "TSH", "T4TOTAL", "FREET4", "T3FREE", "THYROIDAB" in the last 72 hours. Lipid Profile: No results for input(s): "CHOL", "HDL", "LDLCALC", "TRIG", "CHOLHDL", "LDLDIRECT" in the last 72 hours. Anemia Panel: No results for input(s): "VITAMINB12", "FOLATE", "FERRITIN", "TIBC", "IRON", "RETICCTPCT" in the last 72 hours. Urine analysis:    Component Value Date/Time   COLORURINE AMBER (A) 11/06/2022 1322   APPEARANCEUR CLEAR 11/06/2022 1322   LABSPEC 1.015 11/06/2022 1322   PHURINE 5.0 11/06/2022 1322   GLUCOSEU 50 (A) 11/06/2022 1322   GLUCOSEU 250 (A) 06/16/2022 1559   HGBUR NEGATIVE 11/06/2022 1322   BILIRUBINUR NEGATIVE 11/06/2022 1322   KETONESUR NEGATIVE 11/06/2022 1322   PROTEINUR NEGATIVE 11/06/2022 1322   UROBILINOGEN 0.2 06/16/2022 1559   NITRITE NEGATIVE 11/06/2022 1322   LEUKOCYTESUR NEGATIVE 11/06/2022 1322   Sepsis Labs: Invalid input(s): "PROCALCITONIN", "LACTICIDVEN"  Microbiology: No results found for this or any previous visit (from the past 240 hour(s)).  Radiology Studies: No results found.    Barrett Goldie T. [place]  If 7PM-7AM, please contact night-coverage [email or web address] 11/14/2022, 1:52 PM

## 2022-11-14 NOTE — Progress Notes (Signed)
OT Cancellation Note  Patient Details Name: Katherine [name] MRN: [id number] DOB: January 15, 1956   Cancelled Treatment:    Reason Eval/Treat Not Completed: Fatigue/lethargy limiting ability to participate. Pt's RN ha just repositioned her in bed, lights out, and pt kindly requesting to rest but agreeable for eval tomorrow.   Elder Cyphers, OTR/L [facility] Office: [phone]   Magnus Ivan 11/14/2022, 3:45 PM

## 2022-11-14 NOTE — Progress Notes (Signed)
PT Cancellation Note  Patient Details Name: Katherine [name] MRN: [id number] DOB: 07-13-1956   Cancelled Treatment:    Reason Eval/Treat Not Completed: Fatigue/lethargy limiting ability to participate. Patient just finished bath and is too tired to participate.  Agreeable to participating tomorrow.    Shanna Cisco 11/14/2022, 2:35 PM

## 2022-11-15 DIAGNOSIS — I959 Hypotension, unspecified: Secondary | ICD-10-CM | POA: Diagnosis not present

## 2022-11-15 DIAGNOSIS — I25709 Atherosclerosis of coronary artery bypass graft(s), unspecified, with unspecified angina pectoris: Secondary | ICD-10-CM | POA: Diagnosis not present

## 2022-11-15 DIAGNOSIS — N179 Acute kidney failure, unspecified: Secondary | ICD-10-CM | POA: Diagnosis not present

## 2022-11-15 DIAGNOSIS — R0609 Other forms of dyspnea: Secondary | ICD-10-CM | POA: Diagnosis not present

## 2022-11-15 LAB — RENAL FUNCTION PANEL
Albumin: 2.1 g/dL — ABNORMAL LOW (ref 3.5–5.0)
Anion gap: 7 (ref 5–15)
BUN: 16 mg/dL (ref 8–23)
CO2: 27 mmol/L (ref 22–32)
Calcium: 8.4 mg/dL — ABNORMAL LOW (ref 8.9–10.3)
Chloride: 99 mmol/L (ref 98–111)
Creatinine, Ser: 1.02 mg/dL — ABNORMAL HIGH (ref 0.44–1.00)
GFR, Estimated: >60 mL/min (ref >60)
Glucose, Bld: 170 mg/dL — ABNORMAL HIGH (ref 70–99)
Phosphorus: 3.8 mg/dL (ref 2.5–4.6)
Potassium: 4.3 mmol/L (ref 3.5–5.1)
Sodium: 133 mmol/L — ABNORMAL LOW (ref 135–145)

## 2022-11-15 LAB — CBC
HCT: 24.6 % — ABNORMAL LOW (ref 36.0–46.0)
Hemoglobin: 8.1 g/dL — ABNORMAL LOW (ref 12.0–15.0)
MCH: 32.1 pg (ref 26.0–34.0)
MCHC: 32.9 g/dL (ref 30.0–36.0)
MCV: 97.6 fL (ref 80.0–100.0)
Platelets: 188 10*3/uL (ref 150–400)
RBC: 2.52 MIL/uL — ABNORMAL LOW (ref 3.87–5.11)
RDW: 18.8 % — ABNORMAL HIGH (ref 11.5–15.5)
WBC: 5.7 10*3/uL (ref 4.0–10.5)
nRBC: 0 % (ref 0.0–0.2)

## 2022-11-15 LAB — GLUCOSE, CAPILLARY
Glucose-Capillary: 146 mg/dL — ABNORMAL HIGH (ref 70–99)
Glucose-Capillary: 135 mg/dL — ABNORMAL HIGH (ref 70–99)
Glucose-Capillary: 148 mg/dL — ABNORMAL HIGH (ref 70–99)
Glucose-Capillary: 113 mg/dL — ABNORMAL HIGH (ref 70–99)

## 2022-11-15 LAB — MAGNESIUM: Magnesium: 1.9 mg/dL (ref 1.7–2.4)

## 2022-11-15 MED ORDER — ZOLPIDEM TARTRATE 5 MG PO TABS
5.0000 mg | ORAL_TABLET | Freq: Once | ORAL | Status: AC
  Administered 2022-11-15: 5 mg via ORAL
  Filled 2022-11-15: qty 1

## 2022-11-15 MED ORDER — MELATONIN 5 MG PO TABS
5.0000 mg | ORAL_TABLET | Freq: Every evening | ORAL | Status: DC | PRN
  Administered 2022-11-15 – 2022-11-22 (×7): 5 mg via ORAL
  Filled 2022-11-15 (×7): qty 1

## 2022-11-15 NOTE — Plan of Care (Signed)

## 2022-11-15 NOTE — Evaluation (Signed)
Physical Therapy Evaluation Patient Details Name: Katherine [name] MRN: [id number] DOB: 1955-12-23 Today's Date: 11/15/2022  History of Present Illness  Pt is 67 year old presented to [facility] on  11/11/22 for SOB. Pt had just been dc'd home from hospital and PTAR called EMS due to pt reporting chest pain and SOB on arrival home. PMH - ESRD s/p renal transplant on chronic immunosuppression, CAD, CABG, afib, chronic diastolic CHF, COPD on home O2, DM, HTN, depression/anxiety, chronic pain  Clinical Impression  Pt admitted with above diagnosis and presents to PT with functional limitations due to deficits listed below (See PT problem list). Pt needs skilled PT to maximize independence and safety. Feel pt will need post acute rehab <3 hours therapy/daily and pt agreeable.          Recommendations for follow up therapy are one component of a multi-disciplinary discharge planning process, led by the attending physician.  Recommendations may be updated based on patient status, additional functional criteria and insurance authorization.  Follow Up Recommendations Can patient physically be transported by private vehicle: Yes     Assistance Recommended at Discharge Intermittent Supervision/Assistance  Patient can return home with the following  A little help with walking and/or transfers;A little help with bathing/dressing/bathroom;Assist for transportation;Help with stairs or ramp for entrance    Equipment Recommendations None recommended by PT  Recommendations for Other Services       Functional Status Assessment Patient has had a recent decline in their functional status and demonstrates the ability to make significant improvements in function in a reasonable and predictable amount of time.     Precautions / Restrictions Precautions Precautions: Fall Precaution Comments: watch O2      Mobility  Bed Mobility Overal bed mobility: Needs Assistance Bed Mobility: Supine to Sit     Supine  to sit: Supervision, HOB elevated     General bed mobility comments: Incr time to perform    Transfers Overall transfer level: Needs assistance Equipment used: Rolling walker (2 wheels) Transfers: Sit to/from Stand, Bed to chair/wheelchair/BSC Sit to Stand: Min guard   Step pivot transfers: Min guard       General transfer comment: Assist for safety and incr time to rise    Ambulation/Gait Ambulation/Gait assistance: Min guard Gait Distance (Feet): 20 Feet Assistive device: Rolling walker (2 wheels) Gait Pattern/deviations: Step-through pattern, Decreased step length - right, Decreased step length - left, Decreased stride length Gait velocity: decreased Gait velocity interpretation: <1.31 ft/sec, indicative of household ambulator   General Gait Details: Assist for safety and lines. Fatigues after short distance  Financial trader Rankin (Stroke Patients Only)       Balance Overall balance assessment: Needs assistance Sitting-balance support: Feet supported, No upper extremity supported Sitting balance-Leahy Scale: Good     Standing balance support: Single extremity supported, Bilateral upper extremity supported, During functional activity Standing balance-Leahy Scale: Poor Standing balance comment: UE support and min guard for static standing                             Pertinent Vitals/Pain Pain Assessment Pain Assessment: No/denies pain    Home Living Family/patient expects to be discharged to:: Skilled nursing facility Living Arrangements: Alone Available Help at Discharge: Family;Friend(s);Available PRN/intermittently Type of Home: House Home Access: Stairs to enter Entrance Stairs-Rails: Left Entrance Stairs-Number of Steps: 5  Home Layout: One level Home Equipment: Rollator (4 wheels);Cane - single point;Tub bench;Grab bars - toilet;Wheelchair - manual;Hospital bed;BSC/3in1;Transport chair       Prior Function Prior Level of Function : Needs assist       Physical Assist : Mobility (physical);ADLs (physical) Mobility (physical): Gait   Mobility Comments: Limited ambulation distance and assist during last hospitalization. Was receiving assist from brother whe recently died.       Hand Dominance   Dominant Hand: Right    Extremity/Trunk Assessment   Upper Extremity Assessment Upper Extremity Assessment: Defer to OT evaluation    Lower Extremity Assessment Lower Extremity Assessment: Generalized weakness       Communication   Communication: No difficulties  Cognition Arousal/Alertness: Awake/alert Behavior During Therapy: WFL for tasks assessed/performed Overall Cognitive Status: Within Functional Limits for tasks assessed                                          General Comments General comments (skin integrity, edema, etc.): SpO2 88-92% on 8L with activity    Exercises     Assessment/Plan    PT Assessment Patient needs continued PT services  PT Problem List Decreased strength;Decreased activity tolerance;Decreased balance;Decreased mobility;Cardiopulmonary status limiting activity       PT Treatment Interventions DME instruction;Gait training;Functional mobility training;Therapeutic exercise;Therapeutic activities;Balance training;Patient/family education    PT Goals (Current goals can be found in the Care Plan section)  Acute Rehab PT Goals Patient Stated Goal: get stronger at rehab PT Goal Formulation: With patient Time For Goal Achievement: 11/29/22 Potential to Achieve Goals: Good    Frequency Min 1X/week     Co-evaluation               AM-PAC PT "6 Clicks" Mobility  Outcome Measure Help needed turning from your back to your side while in a flat bed without using bedrails?: None Help needed moving from lying on your back to sitting on the side of a flat bed without using bedrails?: A Little Help needed moving to and  from a bed to a chair (including a wheelchair)?: A Little Help needed standing up from a chair using your arms (e.g., wheelchair or bedside chair)?: A Little Help needed to walk in hospital room?: A Little Help needed climbing 3-5 steps with a railing? : Total 6 Click Score: 17    End of Session Equipment Utilized During Treatment: Gait belt;Oxygen Activity Tolerance: Patient limited by fatigue Patient left: in chair;with call bell/phone within reach;with chair alarm set Nurse Communication: Mobility status PT Visit Diagnosis: Other abnormalities of gait and mobility (R26.89);Muscle weakness (generalized) (M62.81);History of falling (Z91.81)    Time: [id number] PT Time Calculation (min) (ACUTE ONLY): 46 min   Charges:   PT Evaluation $PT Eval Moderate Complexity: 1 Mod PT Treatments $Gait Training: 23-37 mins        [place] [facility] [place] 11/15/2022, 11:28 AM

## 2022-11-15 NOTE — Progress Notes (Signed)
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   Patient was hospitalized from 3/22-3/27 for NSTEMI / acute on chronic combined CHF.  Troponin trended from 27-428.  Cardiology consulted, pt not felt to be candidate for LHC due to vascular access issues and transplanted kidney.  TTE with LVEF of 35 to 40%, dyskinesis of mid to distal inferior septal wall and entire apex. Patient was diuresed with IV Lasix and cleared for discharge by cardiology on p.o. Lasix 40 mg twice daily.  [name] was initially added but discontinued on discharge due to soft blood pressures.  Imdur was reduced to 30 mg daily as well.  Therapy recommended SNF but patient chose to go home with home health and DME.  Unfortunately, she didn't do well at all after EMS got to her house.  She became severely SOB just going up the front stairs and pt brought back to ED. Patient's dyspnea improved while in ED.  She had mild AKI.   Now willing to go to SNF.  TOC consulted.   Subjective: Seen and examined earlier this morning.  No major events overnight of this morning.  Feels tired.  No other complaints.  Objective: Vitals:   11/15/22 0852 11/15/22 1115 11/15/22 1130 11/15/22 1140  BP: (!) 111/58 (!) 83/64 97/73   Pulse: 75 63    Resp: 17 16    Temp:  98.4 F (36.9 C)    TempSrc:  Oral    SpO2:  97% 95% 98%  Weight:      Height:        Examination:  GENERAL: No apparent distress.  Nontoxic. HEENT: MMM.  Vision and hearing grossly intact.  NECK: Supple.  No apparent JVD.  RESP:  No IWOB.  Fair aeration  bilaterally. CVS:  RRR. Heart sounds normal.  ABD/GI/GU: BS+. Abd soft, NTND.  MSK/EXT:   No apparent deformity. Moves extremities. No edema.  SKIN: no apparent skin lesion or wound NEURO: Awake and alert. Oriented appropriately.  No apparent focal neuro deficit. PSYCH: Calm. Normal affect.   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 -Continue home inhalers and oxygen  Chest pain/elevated troponin/CAD s/p CABG with multiple stenting:Elevated troponin likely demand ischemia -Previously evaluated by cardiology who recommended medical mgt -Continue Plavix, ranolazine, Crestor, metoprolol and Imdur -Outpatient follow-up with cardiology   Chronic combined CHF: TTE as above.  Diuresed with IV Lasix and discharged on p.o. Lasix previous hosp.  Now with AKI and hypotension. -Continue holding diuretics. -GDMT limited by soft blood pressures. -Strict intake and output, daily weight, renal functions and electrolytes   AKI on CKD-3A/Hx of renal transplant: Resolving. Recent Labs  06/16/22 1559 11/06/22 0920 11/07/22 0125 11/07/22 2332 11/09/22 0222 11/10/22 0249 11/11/22 2112 11/12/22 0928 11/13/22 1003 11/15/22 0229  BUN 13 12 13 15 14 12  30* 27* 21 16  CREATININE 0.92 1.10* 1.04* 1.10* 0.93 0.88 1.48* 1.27* 1.13* 1.02*  -Continue holding diuretics -Avoid hypotension -Continue home Imuran and prednisone  Hypotension: Likely iatrogenic from BP meds, opiate and benzo.  Resolved.  Uncontrolled IDDM-2 with hyperglycemia: A1c 8.5% in 05/2022. Recent Labs  Lab  11/14/22 1242 11/14/22 1640 11/14/22 2114 11/15/22 0742 11/15/22 1224  GLUCAP 155* 149* 220* 146* 135*  -Continue Semglee 12 units daily, NovoLog 3 units 3 times daily with meals and SSI-sensitive -Follow hemoglobin A1c  Paroxysmal A-fib -Metoprolol, Eliquis   Left arm lesion -Has been ongoing over the past several months, has outpatient surgery referral for removal   Debility: Therapy recommended SNF but patient chose to go home with home health previous hospitalization.  Now willing to go to SNF -[facility] consulted.   Anxiety and depression: Poorly controlled. -Vraylar, Lamictal, Ativan, Zoloft   Chronic back pain, narcotic dependent: Narcotic database reviewed. -Oxycodone, Flexeril  Grief: Lost her brother recently. -Emotional support -Offered chaplain consult but waiting on her pastor.  Morbid Obesity:  Body mass index is 38.49 kg/m.  Pressure Skin Injury: POA Pressure Injury 11/06/22 Sacrum Mid;Lower Stage 2 -  Partial thickness loss of dermis presenting as a shallow open injury with a red, pink wound bed without slough. (Active)  11/06/22 2000  Location: Sacrum  Location Orientation: Mid;Lower  Staging: Stage 2 -  Partial thickness loss of dermis presenting as a shallow open injury with a red, pink wound bed without slough.  Wound Description (Comments):   Present on Admission: Yes  Dressing Type Foam - Lift dressing to assess site every shift 11/11/22 0800   DVT prophylaxis:   apixaban (ELIQUIS) tablet 5 mg  Code Status: SNR/DNI Family Communication: None at bedside Level of care: Telemetry Cardiac Status is: Inpatient The patient will remain inpatient because: Safe disposition   Final disposition: SNF Consultants:  None  35 minutes with more than 50% spent in reviewing records, counseling patient/family and coordinating care.   Sch Meds:  Scheduled Meds:  apixaban  5 mg Oral BID   arformoterol  15 mcg Nebulization BID   azaTHIOprine  125 mg Oral  Daily   budesonide  2 mL Nebulization BID   clopidogrel  75 mg Oral Daily   empagliflozin  10 mg Oral Daily   fluticasone  2 spray Each Nare Daily   insulin aspart  0-9 Units Subcutaneous TID WC   insulin aspart  3 Units Subcutaneous TID WC   insulin glargine-yfgn  12 Units Subcutaneous Daily   isosorbide mononitrate  30 mg Oral Daily   lamoTRIgine  25 mg Oral QHS   metoprolol tartrate  50 mg Oral BID   montelukast  10 mg Oral QHS   multivitamin with minerals  1 tablet Oral Daily   pantoprazole  40 mg Oral Daily   predniSONE  5 mg Oral Q breakfast   ranolazine  1,000 mg Oral BID   rosuvastatin  20 mg Oral Daily   sertraline  200 mg Oral Daily   Continuous Infusions: PRN Meds:.acetaminophen **OR** acetaminophen, albuterol, bisacodyl, cyclobenzaprine, gabapentin, LORazepam, nitroGLYCERIN, ondansetron **OR** ondansetron (ZOFRAN) IV, oxyCODONE  Antimicrobials: Anti-infectives (From admission, onward)    None        I have personally reviewed the following labs and images: CBC: Recent Labs  Lab 11/09/22 0222  11/11/22 2112 11/12/22 0928 11/13/22 1003 11/15/22 0229  WBC 8.6 7.1 6.8 5.4 5.7  HGB 9.8* 9.6* 9.8* 10.1* 8.1*  HCT 30.1* 29.2* 30.8* 32.2* 24.6*  MCV 96.2 96.7 97.2 99.1 97.6  PLT 198 208 189 220 188   BMP &GFR Recent Labs  Lab 11/10/22 0249 11/11/22 2112 11/12/22 0928 11/13/22 1003 11/15/22 0229  NA 132* 131* 135 135 133*  K 3.4* 4.6 4.2 3.9 4.3  CL 94* 91* 95* 95* 99  CO2 28 27 26 26 27   GLUCOSE 172* 218* 129* 126* 170*  BUN 12 30* 27* 21 16  CREATININE 0.88 1.48* 1.27* 1.13* 1.02*  CALCIUM 8.4* 8.9 9.1 9.0 8.4*  MG  --   --   --  1.8 1.9  PHOS  --   --   --  3.5 3.8   Estimated Creatinine Clearance: 56.9 mL/min (A) (by C-G formula based on SCr of 1.02 mg/dL (H)). Liver & Pancreas: Recent Labs  Lab 11/13/22 1003 11/15/22 0229  ALBUMIN 2.5* 2.1*   No results for input(s): "LIPASE", "AMYLASE" in the last 168 hours. No results for input(s):  "AMMONIA" in the last 168 hours. Diabetic: No results for input(s): "HGBA1C" in the last 72 hours. Recent Labs  Lab 11/14/22 1242 11/14/22 1640 11/14/22 2114 11/15/22 0742 11/15/22 1224  GLUCAP 155* 149* 220* 146* 135*   Cardiac Enzymes: No results for input(s): "CKTOTAL", "CKMB", "CKMBINDEX", "TROPONINI" in the last 168 hours. No results for input(s): "PROBNP" in the last 8760 hours. Coagulation Profile: No results for input(s): "INR", "PROTIME" in the last 168 hours.  Thyroid Function Tests: No results for input(s): "TSH", "T4TOTAL", "FREET4", "T3FREE", "THYROIDAB" in the last 72 hours. Lipid Profile: No results for input(s): "CHOL", "HDL", "LDLCALC", "TRIG", "CHOLHDL", "LDLDIRECT" in the last 72 hours. Anemia Panel: No results for input(s): "VITAMINB12", "FOLATE", "FERRITIN", "TIBC", "IRON", "RETICCTPCT" in the last 72 hours. Urine analysis:    Component Value Date/Time   COLORURINE AMBER (A) 11/06/2022 1322   APPEARANCEUR CLEAR 11/06/2022 1322   LABSPEC 1.015 11/06/2022 1322   PHURINE 5.0 11/06/2022 1322   GLUCOSEU 50 (A) 11/06/2022 1322   GLUCOSEU 250 (A) 06/16/2022 1559   HGBUR NEGATIVE 11/06/2022 1322   BILIRUBINUR NEGATIVE 11/06/2022 1322   KETONESUR NEGATIVE 11/06/2022 1322   PROTEINUR NEGATIVE 11/06/2022 1322   UROBILINOGEN 0.2 06/16/2022 1559   NITRITE NEGATIVE 11/06/2022 1322   LEUKOCYTESUR NEGATIVE 11/06/2022 1322   Sepsis Labs: Invalid input(s): "PROCALCITONIN", "LACTICIDVEN"  Microbiology: No results found for this or any previous visit (from the past 240 hour(s)).  Radiology Studies: No results found.    Natara Monfort T. Hilda  If 7PM-7AM, please contact night-coverage [email or web address] 11/15/2022, 1:37 PM

## 2022-11-15 NOTE — Evaluation (Signed)
Occupational Therapy Evaluation Patient Details Name: Katherine [name] MRN: [id number] DOB: 05/01/1956 Today's Date: 11/15/2022   History of Present Illness Pt is 67 year old presented to [facility] on  11/11/22 for SOB. Pt had just been dc'd home from hospital and PTAR called EMS due to pt reporting chest pain and SOB on arrival home. PMH - ESRD s/p renal transplant on chronic immunosuppression, CAD, CABG, afib, chronic diastolic CHF, COPD on home O2, DM, HTN, depression/anxiety, chronic pain   Clinical Impression   Patient with readmit fro the diagnosis above.  PTA she was living with her brother, and depended on him for assist with ADL and iADL.  Currently she is needing up to [place] for basic mobility, and up to Mod A for ADL completion from a sit to stand level.  Unfortunately the patient's brother recently passed, and she does not have the needed assist to transition directly home.  OT recommends a lower intensity post acute rehab facility for up to 3 hours of rehab per day.  OT is indicated on the acute setting to address deficits listed.       Recommendations for follow up therapy are one component of a multi-disciplinary discharge planning process, led by the attending physician.  Recommendations may be updated based on patient status, additional functional criteria and insurance authorization.   Assistance Recommended at Discharge Frequent or constant Supervision/Assistance  Patient can return home with the following A little help with walking and/or transfers;A lot of help with bathing/dressing/bathroom;Assistance with cooking/housework;Direct supervision/assist for medications management;Direct supervision/assist for financial management;Help with stairs or ramp for entrance;Assist for transportation    Functional Status Assessment  Patient has had a recent decline in their functional status and demonstrates the ability to make significant improvements in function in a reasonable and  predictable amount of time.  Equipment Recommendations  None recommended by OT    Recommendations for Other Services       Precautions / Restrictions Precautions Precautions: Fall Precaution Comments: watch O2 Restrictions Weight Bearing Restrictions: No      Mobility Bed Mobility               General bed mobility comments: up in recliner Patient Response: Cooperative  Transfers Overall transfer level: Needs assistance Equipment used: Rolling walker (2 wheels) Transfers: Sit to/from Stand, Bed to chair/wheelchair/BSC Sit to Stand: Min guard     Step pivot transfers: Min guard            Balance Overall balance assessment: Needs assistance Sitting-balance support: Feet supported, No upper extremity supported Sitting balance-Leahy Scale: Good     Standing balance support: Reliant on assistive device for balance Standing balance-Leahy Scale: Poor                             ADL either performed or assessed with clinical judgement   ADL Overall ADL's : Needs assistance/impaired Eating/Feeding: Independent;Sitting   Grooming: Standing;Supervision/safety   Upper Body Bathing: Minimal assistance;Sitting   Lower Body Bathing: Moderate assistance;Sit to/from stand   Upper Body Dressing : Minimal assistance;Sitting   Lower Body Dressing: Moderate assistance;Sit to/from stand   Toilet Transfer: Rolling walker (2 wheels);BSC/3in1;Supervision/safety   Toileting- Water quality scientist and Hygiene: Moderate assistance;Sit to/from stand               Vision Baseline Vision/History: 1 Wears glasses Patient Visual Report: No change from baseline       Perception  Praxis      Pertinent Vitals/Pain Pain Assessment Pain Assessment: No/denies pain     Hand Dominance Right   Extremity/Trunk Assessment Upper Extremity Assessment Upper Extremity Assessment: Overall WFL for tasks assessed   Lower Extremity Assessment Lower  Extremity Assessment: Defer to PT evaluation   Cervical / Trunk Assessment Cervical / Trunk Assessment: Kyphotic   Communication Communication Communication: No difficulties   Cognition Arousal/Alertness: Awake/alert Behavior During Therapy: WFL for tasks assessed/performed Overall Cognitive Status: Within Functional Limits for tasks assessed                                       General Comments  SpO2 88-92% on 8L with activity    Exercises     Shoulder Instructions      Home Living Family/patient expects to be discharged to:: Skilled nursing facility Living Arrangements: Alone Available Help at Discharge: Family;Friend(s);Available PRN/intermittently Type of Home: House Home Access: Stairs to enter [employer] of Steps: 5 Entrance Stairs-Rails: Left Home Layout: One level     Bathroom Shower/Tub: Teacher, early years/pre: Standard Bathroom Accessibility: Yes How Accessible: Accessible via walker Home Equipment: Rollator (4 wheels);Cane - single point;Tub bench;Grab bars - toilet;Wheelchair - manual;Hospital bed;BSC/3in1;Transport chair   Additional Comments: 7LO2 baseline;  Pt with recent change in support system.      Prior Functioning/Environment Prior Level of Function : Needs assist       Physical Assist : Mobility (physical);ADLs (physical) Mobility (physical): Gait   Mobility Comments: Limited ambulation distance and assist during last hospitalization. Was receiving assist from brother whe recently died. ADLs Comments: Pt was having assist from brother until he recently passed away (last week or so?)        OT Problem List: Decreased strength;Decreased range of motion;Decreased activity tolerance;Impaired balance (sitting and/or standing);Decreased safety awareness;Cardiopulmonary status limiting activity      OT Treatment/Interventions: Self-care/ADL training;Therapeutic exercise;Energy conservation;DME and/or AE  instruction;Therapeutic activities;Patient/family education;Balance training    OT Goals(Current goals can be found in the care plan section) Acute Rehab OT Goals Patient Stated Goal: Return home OT Goal Formulation: With patient Time For Goal Achievement: 11/27/22 Potential to Achieve Goals: Good ADL Goals Pt Will Perform Lower Body Dressing: with min guard assist;sit to/from stand;with adaptive equipment Pt Will Transfer to Toilet: with modified independence;ambulating;regular height toilet  OT Frequency: Min 2X/week    Co-evaluation              AM-PAC OT "6 Clicks" Daily Activity     Outcome Measure Help from another person eating meals?: None Help from another person taking care of personal grooming?: A Little Help from another person toileting, which includes using toliet, bedpan, or urinal?: A Lot Help from another person bathing (including washing, rinsing, drying)?: A Lot Help from another person to put on and taking off regular upper body clothing?: A Little Help from another person to put on and taking off regular lower body clothing?: A Lot 6 Click Score: 16   End of Session Equipment Utilized During Treatment: Gait belt;Rolling walker (2 wheels);Oxygen Nurse Communication: Mobility status  Activity Tolerance: Patient tolerated treatment well Patient left: in bed;with call bell/phone within reach  OT Visit Diagnosis: Unsteadiness on feet (R26.81);Other abnormalities of gait and mobility (R26.89);Muscle weakness (generalized) (M62.81)                Time: [id number] OT Time Calculation (min):  18 min Charges:  OT General Charges $OT Visit: 1 Visit OT Evaluation $OT Eval Moderate Complexity: 1 Mod  11/15/2022  RP, OTR/L  Acute Rehabilitation Services  Office:  [phone]   Metta Clines 11/15/2022, 2:05 PM

## 2022-11-16 DIAGNOSIS — I25709 Atherosclerosis of coronary artery bypass graft(s), unspecified, with unspecified angina pectoris: Secondary | ICD-10-CM | POA: Diagnosis not present

## 2022-11-16 DIAGNOSIS — I959 Hypotension, unspecified: Secondary | ICD-10-CM | POA: Diagnosis not present

## 2022-11-16 DIAGNOSIS — R0609 Other forms of dyspnea: Secondary | ICD-10-CM | POA: Diagnosis not present

## 2022-11-16 DIAGNOSIS — N179 Acute kidney failure, unspecified: Secondary | ICD-10-CM | POA: Diagnosis not present

## 2022-11-16 LAB — RENAL FUNCTION PANEL
Albumin: 2.4 g/dL — ABNORMAL LOW (ref 3.5–5.0)
Anion gap: 8 (ref 5–15)
BUN: 15 mg/dL (ref 8–23)
CO2: 26 mmol/L (ref 22–32)
Calcium: 8.9 mg/dL (ref 8.9–10.3)
Chloride: 96 mmol/L — ABNORMAL LOW (ref 98–111)
Creatinine, Ser: 1.23 mg/dL — ABNORMAL HIGH (ref 0.44–1.00)
GFR, Estimated: 49 mL/min — ABNORMAL LOW (ref >60)
Glucose, Bld: 148 mg/dL — ABNORMAL HIGH (ref 70–99)
Phosphorus: 4.3 mg/dL (ref 2.5–4.6)
Potassium: 4.2 mmol/L (ref 3.5–5.1)
Sodium: 130 mmol/L — ABNORMAL LOW (ref 135–145)

## 2022-11-16 LAB — CBC
HCT: 29.7 % — ABNORMAL LOW (ref 36.0–46.0)
Hemoglobin: 9.2 g/dL — ABNORMAL LOW (ref 12.0–15.0)
MCH: 31 pg (ref 26.0–34.0)
MCHC: 31 g/dL (ref 30.0–36.0)
MCV: 100 fL (ref 80.0–100.0)
Platelets: 254 10*3/uL (ref 150–400)
RBC: 2.97 MIL/uL — ABNORMAL LOW (ref 3.87–5.11)
RDW: 18.8 % — ABNORMAL HIGH (ref 11.5–15.5)
WBC: 6.4 10*3/uL (ref 4.0–10.5)
nRBC: 0 % (ref 0.0–0.2)

## 2022-11-16 LAB — MAGNESIUM: Magnesium: 2.1 mg/dL (ref 1.7–2.4)

## 2022-11-16 LAB — GLUCOSE, CAPILLARY
Glucose-Capillary: 124 mg/dL — ABNORMAL HIGH (ref 70–99)
Glucose-Capillary: 86 mg/dL (ref 70–99)
Glucose-Capillary: 162 mg/dL — ABNORMAL HIGH (ref 70–99)
Glucose-Capillary: 187 mg/dL — ABNORMAL HIGH (ref 70–99)

## 2022-11-16 LAB — HEMOGLOBIN A1C
Hgb A1c MFr Bld: 8.3 % — ABNORMAL HIGH (ref 4.8–5.6)
Mean Plasma Glucose: 192 mg/dL

## 2022-11-16 MED ORDER — FUROSEMIDE 40 MG PO TABS: 40.0000 mg | ORAL_TABLET | Freq: Every day | ORAL | 0 refills | Status: DC

## 2022-11-16 MED ORDER — BASAGLAR KWIKPEN 100 UNIT/ML ~~LOC~~ SOPN: 20.0000 [IU] | PEN_INJECTOR | Freq: Every day | SUBCUTANEOUS | 1 refills | Status: DC

## 2022-11-16 MED ORDER — OXYCODONE HCL 10 MG PO TABS: 10.0000 mg | ORAL_TABLET | Freq: Three times a day (TID) | ORAL | 0 refills | Status: DC | PRN

## 2022-11-16 NOTE — TOC Initial Note (Signed)
Transition of Care [facility]) - Initial/Assessment Note    Patient Details  Name: Katherine [name] MRN: [id number] Date of Birth: [birth year]  Transition of Care [facility]) CM/SW Contact:    Bethann Berkshire, [place] Phone Number: 11/16/2022, 12:34 PM  Clinical Narrative:                  CSW met with pt to discuss SNF rec. Pt hesitant about SNF though agreeable at this time as she lives alone with limited support. She states her neighbor is currently caring for her dog. Pt agrees to SNF w/u and prefers a facility in [place] area. Fl2 completed and bed requests sent in hub. TOC will follow up with SNF bed offers.    PASRR pending  Expected Discharge Plan: Skilled Nursing Facility Barriers to Discharge: Ship broker, Continued Medical Work up, SNF Pending bed offer   Patient Goals and CMS Choice            Expected Discharge Plan and Services                                              Prior Living Arrangements/Services                       Activities of Daily Living Home Assistive Devices/Equipment: Bedside commode/3-in-1, Wheelchair, Oxygen ADL Screening (condition at time of admission) Patient's cognitive ability adequate to safely complete daily activities?: Yes Is the patient deaf or have difficulty hearing?: No Does the patient have difficulty seeing, even when wearing glasses/contacts?: No Does the patient have difficulty concentrating, remembering, or making decisions?: No Patient able to express need for assistance with ADLs?: Yes Does the patient have difficulty dressing or bathing?: Yes Independently performs ADLs?: No Communication: Independent Dressing (OT): Dependent Is this a change from baseline?: Change from baseline, expected to last >3 days Grooming: Needs assistance Is this a change from baseline?: Change from baseline, expected to last >3 days Feeding: Independent Bathing: Dependent Is this a change from baseline?: Change from  baseline, expected to last >3 days Toileting: Dependent Is this a change from baseline?: Change from baseline, expected to last >3days In/Out Bed: Dependent Is this a change from baseline?: Change from baseline, expected to last >3 days Walks in Home: Dependent Is this a change from baseline?: Change from baseline, expected to last >3 days Does the patient have difficulty walking or climbing stairs?: Yes Weakness of Legs: Both Weakness of Arms/Hands: Both  Permission Sought/Granted   Permission granted to share information with : Yes, Verbal Permission Granted  Share Information with NAME: Austin,Sherry (Niece)           Emotional Assessment              Admission diagnosis:  SOB (shortness of breath) [R06.02] Dyspnea on exertion [R06.09] AKI (acute kidney injury) [N17.9] Patient Active Problem List   Diagnosis Date Noted   Dyspnea on exertion 11/12/2022   Hypotension 11/12/2022   Pressure injury of skin 11/07/2022   Chest pain 11/06/2022   Acute upper respiratory infection 10/14/2022   Acute cystitis without hematuria 06/30/2022   Diuretic-induced hypokalemia 06/17/2022   Insulin-requiring or dependent type II diabetes mellitus 06/17/2022   Mass of skin of left forearm 06/16/2022   Spondylosis of lumbar region without myelopathy or radiculopathy 12/19/2021   Encounter for palliative care involving management  of pain 12/19/2021   COPD with acute exacerbation 12/07/2021   CHF exacerbation 10/18/2021   Goals of care, counseling/discussion 10/03/2021   Class 2 obesity 09/25/2021   Rest pain of lower extremity due to atherosclerosis 09/02/2021   Longstanding persistent atrial fibrillation    Chronic combined systolic and diastolic heart failure [id number]   Obesity (BMI 30-39.9) 08/05/2021   Polymorphic ventricular tachycardia    Bradycardia    COPD (chronic obstructive pulmonary disease)    Acute kidney injury superimposed on CKD    History of renal transplant     Coronary stent restenosis due to scar tissue    Chronic respiratory failure with hypoxia 01/01/2021   Candidal skin infection 08/13/2020   Chronic bronchitis, mucopurulent 08/24/2019   Current chronic use of systemic steroids 07/23/2019   OSA (obstructive sleep apnea)    Severe episode of recurrent major depressive disorder, with psychotic features 02/16/2019   Obstructive chronic bronchitis without exacerbation 11/03/2018   Long term (current) use of antithrombotics/antiplatelets 04/28/2018   LBBB (left bundle branch block) 10/07/2017   Primary osteoarthritis of right knee 08/18/2017   Ovarian cyst 05/18/2017   Mild aortic stenosis by prior echocardiogram 01/28/2017   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   Normocytic anemia 10/13/2016   Fibromyalgia 03/30/2016   Other spondylosis with radiculopathy, lumbar region 03/30/2016   Type 2 diabetes mellitus with hyperlipidemia 03/30/2016   Paroxysmal atrial fibrillation ([place]); CHA2DS2VASc score F, HTN, CAD, CVA = 5 06/28/2014   Debility 06/28/2014   CAD S/P percutaneous coronary angioplasty - PCI x 5 to SVG-D1 09/11/2013    Class: Diagnosis of   Essential hypertension    PAD (peripheral artery disease) ([place]) 08/17/2013   CAD (coronary artery disease) of bypass graft 10/08/2012   Stenosis of right carotid artery without infarction 10/08/2012   Dyslipidemia, goal LDL below 70 10/08/2012    Class: Diagnosis of   Mitral annular calcification 10/08/2012   Renal transplant disorder 06/12/2012   Chronic allergic rhinitis 04/29/2011   Extrinsic asthma 09/09/2007   GERD 09/09/2007   Morbid obesity - s/p Lap Band 9/'05 05/07/2004    Class: Diagnosis of   PCP:  [name], MD Pharmacy:   [id number] - [address], [address] [place] [place] [address] Phone: [phone] Fax: [phone]  [facility], [address] [address] [place] [address] Phone: [phone]  Fax: [phone]  [facility] [address] [place] [address] Phone: [phone] Fax: [phone]     Social Determinants of Health (SDOH) Social History: SDOH Screenings   Food Insecurity: No Food Insecurity (11/12/2022)  Housing: Low Risk  (11/12/2022)  Transportation Needs: Unmet Transportation Needs (11/12/2022)  Utilities: At Risk (11/12/2022)  Alcohol Screen: Low Risk  (12/10/2021)  Depression (PHQ2-9): Medium Risk (02/02/2022)  Tobacco Use: Medium Risk (11/12/2022)   SDOH Interventions:     Readmission Risk Interventions    11/09/2022    2:22 PM 12/12/2021   10:01 AM 09/23/2021    4:39 PM  Readmission Risk Prevention Plan  Transportation Screening Complete Complete Complete  Medication Review ([place]) Referral to Pharmacy Complete Complete  PCP or Specialist appointment within 3-5 days of discharge  Complete Complete  HRI or [place] Complete Complete Complete  SW Recovery Care/Counseling Consult Complete Complete Complete  Palliative Care Screening Not Applicable Not Applicable Complete  [place] Patient Refused Not Applicable Not Applicable

## 2022-11-16 NOTE — Care Management Important Message (Signed)
Important Message  Patient Details  Name: Katherine [name] MRN: [id number] Date of Birth: 10/23/1955   Medicare Important Message Given:  Yes     [name] 11/16/2022, 8:58 AM

## 2022-11-16 NOTE — Progress Notes (Addendum)
Name: Katherine [name] DOB: 09/08/1955  Please be advised that the above-named patient will require a short-term nursing home stay -- anticipated 30 days or less for rehabilitation and strengthening. The plan is for return home.   Kiva Martinique, MSW, LCSW-A 4/1/202411:04 AM

## 2022-11-16 NOTE — Progress Notes (Signed)
PROGRESS NOTE  Katherine [name] [id number] DOB: 02-06-1956   PCP: [name], MD  Patient is from: Home  DOA: 11/11/2022 LOS: 4  Chief complaints Chief Complaint  Patient presents with   Shortness of Breath     Brief Narrative / Interim history: 67 year old F with PMH of COPD, combined CHF, OSA, chronic hypoxic RF on 7 L, CAD, ESRD s/p renal transplant, Afib on eliquis returning with shortness of breath 3 hours after hospital discharge.   Patient was hospitalized from 3/22-3/27 for NSTEMI / acute on chronic combined CHF.  Troponin trended from 27-428.  Cardiology consulted, pt not felt to be candidate for LHC due to vascular access issues and transplanted kidney.  TTE with LVEF of 35 to 40%, dyskinesis of mid to distal inferior septal wall and entire apex. Patient was diuresed with IV Lasix and cleared for discharge by cardiology on p.o. Lasix 40 mg twice daily.  [name] was initially added but discontinued on discharge due to soft blood pressures.  Imdur was reduced to 30 mg daily as well.  Therapy recommended SNF but patient chose to go home with home health and DME.  Unfortunately, she didn't do well at all after EMS got to her house.  She became severely SOB just going up the front stairs and pt brought back to ED. Patient's dyspnea improved while in ED.  She had mild AKI.   Now willing to go to SNF.  TOC consulted.   Subjective: Seen and examined earlier this morning.  No major events overnight of this morning.  Feels tired and short of breath at times.  Also intermittent chest pain.  Objective: Vitals:   11/16/22 0858 11/16/22 0907 11/16/22 1217 11/16/22 1238  BP:  103/62 98/67 98/67   Pulse:   (!) 126 72  Resp:   18 19  Temp:    98.4 F (36.9 C)  TempSrc:    Oral  SpO2: 91%  94% (!) 89%  Weight:      Height:        Examination:  GENERAL: No apparent distress.  Nontoxic. HEENT: MMM.  Vision and hearing grossly intact.  NECK: Supple.  No apparent JVD.   RESP:  No IWOB.  Fair aeration bilaterally. CVS:  RRR. Heart sounds normal.  ABD/GI/GU: BS+. Abd soft, NTND.  MSK/EXT:   No apparent deformity. Moves extremities. No edema.  SKIN: no apparent skin lesion or wound NEURO: Awake and alert. Oriented appropriately.  No apparent focal neuro deficit. PSYCH: Calm. Normal affect.   Procedures:  None  Microbiology summarized: None  Assessment and plan: Principal Problem:   Dyspnea on exertion Active Problems:   Acute kidney injury superimposed on CKD   CAD (coronary artery disease) of bypass graft   Paroxysmal atrial fibrillation ([place]); CHA2DS2VASc score F, HTN, CAD, CVA = 5   Type 2 diabetes mellitus with hyperlipidemia   Hypotension   Dyspnea on exertion/COPD/chronic hypoxic RF on 7 L at baseline: Dyspnea has improved. -Continue home inhalers  -Minimum oxygen to keep saturation above 88% regardless of home 5 to 7 L.  Chest pain/elevated troponin/CAD s/p CABG with multiple stenting:Elevated troponin likely demand ischemia -Previously evaluated by cardiology who recommended medical mgt -Continue Plavix, ranolazine, Crestor, metoprolol and Imdur -Outpatient follow-up with cardiology   Chronic combined CHF: TTE as above.  Diuresed with IV Lasix and discharged on p.o. Lasix previous hosp.  Now with AKI and hypotension. -Continue holding diuretics. -GDMT limited by soft blood pressures. -Strict intake and output, daily  weight, renal functions and electrolytes   AKI on CKD-3A/Hx of renal transplant: Cr slightly up after interval improvement Recent Labs    11/06/22 0920 11/07/22 0125 11/07/22 2332 11/09/22 0222 11/10/22 0249 11/11/22 2112 11/12/22 0928 11/13/22 1003 11/15/22 0229 11/16/22 0201  BUN 12 13 15 14 12  30* 27* 21 16 15   CREATININE 1.10* 1.04* 1.10* 0.93 0.88 1.48* 1.27* 1.13* 1.02* 1.23*  -Continue holding diuretics -Avoid hypotension -Continue home Imuran and prednisone  Hypotension: Likely iatrogenic from BP  meds, opiate and benzo.  Resolved.  Uncontrolled IDDM-2 with hyperglycemia: A1c 8.5% in 05/2022. Recent Labs  Lab 11/15/22 1224 11/15/22 1616 11/15/22 2119 11/16/22 0850 11/16/22 1241  GLUCAP 135* 148* 113* 124* 86  -Continue Semglee 12 units daily, NovoLog 3 units 3 times daily with meals and SSI-sensitive -Follow hemoglobin A1c  Paroxysmal A-fib -Metoprolol, Eliquis   Left arm lesion -Has been ongoing over the past several months, has outpatient surgery referral for removal   Debility: Therapy recommended SNF but patient chose to go home with home health previous hospitalization.  Now willing to go to SNF -[facility] consulted.   Anxiety and depression: Poorly controlled. -Vraylar, Lamictal, Ativan, Zoloft   Chronic back pain, narcotic dependent: Narcotic database reviewed. -Oxycodone, Flexeril  Grief: Lost her brother recently. -Emotional support -Offered chaplain consult but waiting on her pastor.  Morbid Obesity:  Body mass index is 38.49 kg/m.  Pressure Skin Injury: POA Pressure Injury 11/06/22 Sacrum Mid;Lower Stage 2 -  Partial thickness loss of dermis presenting as a shallow open injury with a red, pink wound bed without slough. (Active)  11/06/22 2000  Location: Sacrum  Location Orientation: Mid;Lower  Staging: Stage 2 -  Partial thickness loss of dermis presenting as a shallow open injury with a red, pink wound bed without slough.  Wound Description (Comments):   Present on Admission: Yes  Dressing Type Foam - Lift dressing to assess site every shift 11/11/22 0800   DVT prophylaxis:   apixaban (ELIQUIS) tablet 5 mg  Code Status: SNR/DNI Family Communication: None at bedside Level of care: Telemetry Cardiac Status is: Inpatient The patient will remain inpatient because: Safe disposition   Final disposition: SNF Consultants:  None  35 minutes with more than 50% spent in reviewing records, counseling patient/family and coordinating care.   Sch Meds:   Scheduled Meds:  apixaban  5 mg Oral BID   arformoterol  15 mcg Nebulization BID   azaTHIOprine  125 mg Oral Daily   budesonide  2 mL Nebulization BID   clopidogrel  75 mg Oral Daily   empagliflozin  10 mg Oral Daily   fluticasone  2 spray Each Nare Daily   insulin aspart  0-9 Units Subcutaneous TID WC   insulin aspart  3 Units Subcutaneous TID WC   insulin glargine-yfgn  12 Units Subcutaneous Daily   isosorbide mononitrate  30 mg Oral Daily   lamoTRIgine  25 mg Oral QHS   metoprolol tartrate  50 mg Oral BID   montelukast  10 mg Oral QHS   multivitamin with minerals  1 tablet Oral Daily   pantoprazole  40 mg Oral Daily   predniSONE  5 mg Oral Q breakfast   ranolazine  1,000 mg Oral BID   rosuvastatin  20 mg Oral Daily   sertraline  200 mg Oral Daily   Continuous Infusions: PRN Meds:.acetaminophen **OR** acetaminophen, albuterol, bisacodyl, cyclobenzaprine, gabapentin, LORazepam, melatonin, nitroGLYCERIN, ondansetron **OR** ondansetron (ZOFRAN) IV, oxyCODONE  Antimicrobials: Anti-infectives (From admission, onward)  None        I have personally reviewed the following labs and images: CBC: Recent Labs  Lab 11/11/22 2112 11/12/22 0928 11/13/22 1003 11/15/22 0229 11/16/22 0201  WBC 7.1 6.8 5.4 5.7 6.4  HGB 9.6* 9.8* 10.1* 8.1* 9.2*  HCT 29.2* 30.8* 32.2* 24.6* 29.7*  MCV 96.7 97.2 99.1 97.6 100.0  PLT 208 189 220 188 254   BMP &GFR Recent Labs  Lab 11/11/22 2112 11/12/22 0928 11/13/22 1003 11/15/22 0229 11/16/22 0201  NA 131* 135 135 133* 130*  K 4.6 4.2 3.9 4.3 4.2  CL 91* 95* 95* 99 96*  CO2 27 26 26 27 26   GLUCOSE 218* 129* 126* 170* 148*  BUN 30* 27* 21 16 15   CREATININE 1.48* 1.27* 1.13* 1.02* 1.23*  CALCIUM 8.9 9.1 9.0 8.4* 8.9  MG  --   --  1.8 1.9 2.1  PHOS  --   --  3.5 3.8 4.3   Estimated Creatinine Clearance: 47.2 mL/min (A) (by C-G formula based on SCr of 1.23 mg/dL (H)). Liver & Pancreas: Recent Labs  Lab 11/13/22 1003  11/15/22 0229 11/16/22 0201  ALBUMIN 2.5* 2.1* 2.4*   No results for input(s): "LIPASE", "AMYLASE" in the last 168 hours. No results for input(s): "AMMONIA" in the last 168 hours. Diabetic: Recent Labs    11/15/22 0229  HGBA1C 8.3*   Recent Labs  Lab 11/15/22 1224 11/15/22 1616 11/15/22 2119 11/16/22 0850 11/16/22 1241  GLUCAP 135* 148* 113* 124* 86   Cardiac Enzymes: No results for input(s): "CKTOTAL", "CKMB", "CKMBINDEX", "TROPONINI" in the last 168 hours. No results for input(s): "PROBNP" in the last 8760 hours. Coagulation Profile: No results for input(s): "INR", "PROTIME" in the last 168 hours.  Thyroid Function Tests: No results for input(s): "TSH", "T4TOTAL", "FREET4", "T3FREE", "THYROIDAB" in the last 72 hours. Lipid Profile: No results for input(s): "CHOL", "HDL", "LDLCALC", "TRIG", "CHOLHDL", "LDLDIRECT" in the last 72 hours. Anemia Panel: No results for input(s): "VITAMINB12", "FOLATE", "FERRITIN", "TIBC", "IRON", "RETICCTPCT" in the last 72 hours. Urine analysis:    Component Value Date/Time   COLORURINE AMBER (A) 11/06/2022 1322   APPEARANCEUR CLEAR 11/06/2022 1322   LABSPEC 1.015 11/06/2022 1322   PHURINE 5.0 11/06/2022 1322   GLUCOSEU 50 (A) 11/06/2022 1322   GLUCOSEU 250 (A) 06/16/2022 1559   HGBUR NEGATIVE 11/06/2022 1322   BILIRUBINUR NEGATIVE 11/06/2022 1322   KETONESUR NEGATIVE 11/06/2022 1322   PROTEINUR NEGATIVE 11/06/2022 1322   UROBILINOGEN 0.2 06/16/2022 1559   NITRITE NEGATIVE 11/06/2022 1322   LEUKOCYTESUR NEGATIVE 11/06/2022 1322   Sepsis Labs: Invalid input(s): "PROCALCITONIN", "LACTICIDVEN"  Microbiology: No results found for this or any previous visit (from the past 240 hour(s)).  Radiology Studies: No results found.    Aeliana Spates T. Pettis  If 7PM-7AM, please contact night-coverage [email or web address] 11/16/2022, 1:24 PM

## 2022-11-16 NOTE — [place] FL2 (Addendum)
[place] MEDICAID FL2 LEVEL OF CARE FORM     IDENTIFICATION  Patient Name: Katherine [name] Birthdate: 02-26-1956 Sex: female Admission Date (Current Location): 11/11/2022  [facility] [place] and [place] Number:  Producer, television/film/video and Address:  [facility], [address] [place], [place] [address]      Provider Number: [id number]  Attending Physician Name and Address:  Almon Hercules, MD  Relative Name and Phone Number:       Current Level of Care: Hospital Recommended Level of Care: Skilled Nursing Facility Prior Approval Number:    Date Approved/Denied:   PASRR Number:  [id number]  Discharge Plan: SNF    Current Diagnoses: Patient Active Problem List   Diagnosis Date Noted   Dyspnea on exertion 11/12/2022   Hypotension 11/12/2022   Pressure injury of skin 11/07/2022   Chest pain 11/06/2022   Acute upper respiratory infection 10/14/2022   Acute cystitis without hematuria 06/30/2022   Diuretic-induced hypokalemia 06/17/2022   Insulin-requiring or dependent type II diabetes mellitus 06/17/2022   Mass of skin of left forearm 06/16/2022   Spondylosis of lumbar region without myelopathy or radiculopathy 12/19/2021   Encounter for palliative care involving management of pain 12/19/2021   COPD with acute exacerbation 12/07/2021   CHF exacerbation 10/18/2021   Goals of care, counseling/discussion 10/03/2021   Class 2 obesity 09/25/2021   Rest pain of lower extremity due to atherosclerosis 09/02/2021   Longstanding persistent atrial fibrillation    Chronic combined systolic and diastolic heart failure 08/06/2021   Obesity (BMI 30-39.9) 08/05/2021   Polymorphic ventricular tachycardia    Bradycardia    COPD (chronic obstructive pulmonary disease)    Acute kidney injury superimposed on CKD    History of renal transplant    Coronary stent restenosis due to scar tissue    Chronic respiratory failure with hypoxia 01/01/2021   Candidal skin infection  08/13/2020   Chronic bronchitis, mucopurulent 08/24/2019   Current chronic use of systemic steroids 07/23/2019   OSA (obstructive sleep apnea)    Severe episode of recurrent major depressive disorder, with psychotic features 02/16/2019   Obstructive chronic bronchitis without exacerbation 11/03/2018   Long term (current) use of antithrombotics/antiplatelets 04/28/2018   LBBB (left bundle branch block) 10/07/2017   Primary osteoarthritis of right knee 08/18/2017   Ovarian cyst 05/18/2017   Mild aortic stenosis by prior echocardiogram 01/28/2017   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   Normocytic anemia 10/13/2016   Fibromyalgia 03/30/2016   Other spondylosis with radiculopathy, lumbar region 03/30/2016   Type 2 diabetes mellitus with hyperlipidemia 03/30/2016   Paroxysmal atrial fibrillation (HCC); CHA2DS2VASc score F, HTN, CAD, CVA = 5 06/28/2014   Debility 06/28/2014   CAD S/P percutaneous coronary angioplasty - PCI x 5 to SVG-D1 09/11/2013   Essential hypertension    PAD (peripheral artery disease) (HCC) 08/17/2013   CAD (coronary artery disease) of bypass graft 10/08/2012   Stenosis of right carotid artery without infarction 10/08/2012   Dyslipidemia, goal LDL below 70 10/08/2012   Mitral annular calcification 10/08/2012   Renal transplant disorder 06/12/2012   Chronic allergic rhinitis 04/29/2011   Extrinsic asthma 09/09/2007   GERD 09/09/2007   Morbid obesity - s/p Lap Band 9/'05 05/07/2004    Orientation RESPIRATION BLADDER Height & Weight     Time, Self, Situation, Place  O2 (5 L&C) Incontinent Weight: 203 lb 11.3 oz (92.4 kg) Height:  5\' 1"  (154.9 cm)  BEHAVIORAL SYMPTOMS/MOOD NEUROLOGICAL BOWEL NUTRITION STATUS  Continent Diet (see discharge summary)  AMBULATORY STATUS COMMUNICATION OF NEEDS Skin   Extensive Assist Verbally PU Stage and Appropriate Care, Surgical wounds (sacrum mid, check every shift. Arm;left lower posterior with abnormal borders)                        Personal Care Assistance Level of Assistance  Bathing, Feeding, Dressing Bathing Assistance: Limited assistance Feeding assistance: Independent Dressing Assistance: Limited assistance     Functional Limitations Info  Sight Sight Info: Impaired (wears glasses)        SPECIAL CARE FACTORS FREQUENCY  PT (By licensed PT), OT (By licensed OT)     PT Frequency: 5x/week OT Frequency: 5x/week            Contractures Contractures Info: Not present    Additional Factors Info  Code Status, Allergies, Psychotropic Code Status Info: DNR Allergies Info: Tetracycline  Niacin  Niaspan (Niacin Er)  Sulfa Antibiotics  Sulfonamide Derivatives  Codeine  Erythromycin  Hydromorphone Hcl  Morphine And Related  Nalbuphine  Sulfasalazine  Tape Psychotropic Info: Vraylar, Lamictal, Ativan, Zoloft         Current Medications (11/16/2022):  This is the current hospital active medication list Current Facility-Administered Medications  Medication Dose Route Frequency Provider Last Rate Last Admin   acetaminophen (TYLENOL) tablet 650 mg  650 mg Oral Q6H PRN Etta Quill, DO   650 mg at 11/12/22 2244   Or   acetaminophen (TYLENOL) suppository 650 mg  650 mg Rectal Q6H PRN Etta Quill, DO       albuterol (PROVENTIL) (2.5 MG/3ML) 0.083% nebulizer solution 2.5 mg  2.5 mg Inhalation Q6H PRN Etta Quill, DO       apixaban Arne Cleveland) tablet 5 mg  5 mg Oral BID Etta Quill, DO   5 mg at 11/16/22 [id number]   arformoterol (BROVANA) nebulizer solution 15 mcg  15 mcg Nebulization BID Etta Quill, DO   15 mcg at 11/16/22 [id number]   azaTHIOprine (IMURAN) tablet 125 mg  125 mg Oral Daily Etta Quill, DO   125 mg at 11/15/22 1723   bisacodyl (DULCOLAX) EC tablet 5 mg  5 mg Oral Daily PRN Etta Quill, DO   5 mg at 11/16/22 0928   budesonide (PULMICORT) nebulizer solution 0.5 mg  2 mL Nebulization BID Etta Quill, DO   0.5 mg at 11/16/22 [id number]   clopidogrel (PLAVIX) tablet 75 mg  75 mg  Oral Daily Etta Quill, DO   75 mg at 11/16/22 [id number]   cyclobenzaprine (FLEXERIL) tablet 10 mg  10 mg Oral TID PRN Mansy, Jan A, MD   10 mg at 11/15/22 1727   empagliflozin (JARDIANCE) tablet 10 mg  10 mg Oral Daily Etta Quill, DO   10 mg at 11/16/22 0904   fluticasone (FLONASE) 50 MCG/ACT nasal spray 2 spray  2 spray Each Nare Daily Etta Quill, DO   2 spray at 11/16/22 [id number]   gabapentin (NEURONTIN) capsule 100 mg  100 mg Oral Daily PRN Etta Quill, DO   100 mg at 11/14/22 2203   insulin aspart (novoLOG) injection 0-9 Units  0-9 Units Subcutaneous TID WC Etta Quill, DO   1 Units at 11/16/22 0900   insulin aspart (novoLOG) injection 3 Units  3 Units Subcutaneous TID WC Etta Quill, DO   3 Units at 11/16/22 0900   insulin glargine-yfgn (SEMGLEE) injection 12 Units  12 Units  Subcutaneous Daily Etta Quill, DO   12 Units at 11/16/22 [id number]   isosorbide mononitrate (IMDUR) 24 hr tablet 30 mg  30 mg Oral Daily Wendee Beavers T, MD   30 mg at 11/16/22 [id number]   lamoTRIgine (LAMICTAL) tablet 25 mg  25 mg Oral QHS Jennette Kettle M, DO   25 mg at 11/15/22 2109   LORazepam (ATIVAN) tablet 1 mg  1 mg Oral Q8H PRN Etta Quill, DO   1 mg at 11/16/22 [id number]   melatonin tablet 5 mg  5 mg Oral QHS PRN Irene Pap N, DO   5 mg at 11/15/22 2108   metoprolol tartrate (LOPRESSOR) tablet 50 mg  50 mg Oral BID Etta Quill, DO   50 mg at 11/16/22 0904   montelukast (SINGULAIR) tablet 10 mg  10 mg Oral QHS Etta Quill, DO   10 mg at 11/15/22 2109   multivitamin with minerals tablet 1 tablet  1 tablet Oral Daily Etta Quill, DO   1 tablet at 11/16/22 0904   nitroGLYCERIN (NITROSTAT) SL tablet 0.4 mg  0.4 mg Sublingual Q5 min PRN Mansy, Jan A, MD   0.4 mg at 11/13/22 2243   ondansetron (ZOFRAN) tablet 4 mg  4 mg Oral Q6H PRN Etta Quill, DO       Or   ondansetron [facility]) injection 4 mg  4 mg Intravenous Q6H PRN Etta Quill, DO   4 mg at 11/13/22 1724   oxyCODONE  (Oxy IR/ROXICODONE) immediate release tablet 10 mg  10 mg Oral Q8H PRN Etta Quill, DO   10 mg at 11/16/22 0903   pantoprazole (PROTONIX) EC tablet 40 mg  40 mg Oral Daily Jennette Kettle M, DO   40 mg at 11/16/22 [id number]   predniSONE (DELTASONE) tablet 5 mg  5 mg Oral Q breakfast Jennette Kettle M, DO   5 mg at 11/16/22 0900   ranolazine (RANEXA) 12 hr tablet 1,000 mg  1,000 mg Oral BID Etta Quill, DO   1,000 mg at 11/16/22 [id number]   rosuvastatin (CRESTOR) tablet 20 mg  20 mg Oral Daily Jennette Kettle M, DO   20 mg at 11/16/22 [id number]   sertraline (ZOLOFT) tablet 200 mg  200 mg Oral Daily Jennette Kettle M, DO   200 mg at 11/16/22 [id number]     Discharge Medications: Please see discharge summary for a list of discharge medications.  Relevant Imaging Results:  Relevant Lab Results:   Additional Information SSN [id number]  [place], [place]

## 2022-11-16 NOTE — Progress Notes (Signed)
Wean to 5 liters Nasal Canula.

## 2022-11-17 DIAGNOSIS — N179 Acute kidney failure, unspecified: Secondary | ICD-10-CM | POA: Diagnosis not present

## 2022-11-17 DIAGNOSIS — I959 Hypotension, unspecified: Secondary | ICD-10-CM | POA: Diagnosis not present

## 2022-11-17 DIAGNOSIS — I25709 Atherosclerosis of coronary artery bypass graft(s), unspecified, with unspecified angina pectoris: Secondary | ICD-10-CM | POA: Diagnosis not present

## 2022-11-17 DIAGNOSIS — R0609 Other forms of dyspnea: Secondary | ICD-10-CM | POA: Diagnosis not present

## 2022-11-17 LAB — GLUCOSE, CAPILLARY
Glucose-Capillary: 148 mg/dL — ABNORMAL HIGH (ref 70–99)
Glucose-Capillary: 184 mg/dL — ABNORMAL HIGH (ref 70–99)
Glucose-Capillary: 105 mg/dL — ABNORMAL HIGH (ref 70–99)
Glucose-Capillary: 138 mg/dL — ABNORMAL HIGH (ref 70–99)

## 2022-11-17 LAB — RENAL FUNCTION PANEL
Albumin: 2.4 g/dL — ABNORMAL LOW (ref 3.5–5.0)
Anion gap: 11 (ref 5–15)
BUN: 15 mg/dL (ref 8–23)
CO2: 21 mmol/L — ABNORMAL LOW (ref 22–32)
Calcium: 8.7 mg/dL — ABNORMAL LOW (ref 8.9–10.3)
Chloride: 101 mmol/L (ref 98–111)
Creatinine, Ser: 0.99 mg/dL (ref 0.44–1.00)
GFR, Estimated: >60 mL/min (ref >60)
Glucose, Bld: 151 mg/dL — ABNORMAL HIGH (ref 70–99)
Phosphorus: 4.5 mg/dL (ref 2.5–4.6)
Potassium: 4 mmol/L (ref 3.5–5.1)
Sodium: 133 mmol/L — ABNORMAL LOW (ref 135–145)

## 2022-11-17 LAB — MAGNESIUM: Magnesium: 2 mg/dL (ref 1.7–2.4)

## 2022-11-17 MED ORDER — FUROSEMIDE 40 MG PO TABS
40.0000 mg | ORAL_TABLET | Freq: Every day | ORAL | Status: DC
  Administered 2022-11-17 – 2022-11-19 (×3): 40 mg via ORAL
  Filled 2022-11-17 (×3): qty 1

## 2022-11-17 NOTE — Progress Notes (Signed)
PROGRESS NOTE  Katherine [name] [id number] DOB: 04-27-1956   PCP: [name], MD  Patient is from: Home  DOA: 11/11/2022 LOS: 5  Chief complaints Chief Complaint  Patient presents with   Shortness of Breath     Brief Narrative / Interim history: 67 year old F with PMH of COPD, combined CHF, OSA, chronic hypoxic RF on 7 L, CAD, ESRD s/p renal transplant, Afib on eliquis returning with shortness of breath 3 hours after hospital discharge.   Patient was hospitalized from 3/22-3/27 for NSTEMI / acute on chronic combined CHF.  Troponin trended from 27-428.  Cardiology consulted, pt not felt to be candidate for LHC due to vascular access issues and transplanted kidney.  TTE with LVEF of 35 to 40%, dyskinesis of mid to distal inferior septal wall and entire apex. Patient was diuresed with IV Lasix and cleared for discharge by cardiology on p.o. Lasix 40 mg twice daily.  [name] was initially added but discontinued on discharge due to soft blood pressures.  Imdur was reduced to 30 mg daily as well.  Therapy recommended SNF but patient chose to go home with home health and DME.  Unfortunately, she didn't do well at all after EMS got to her house.  She became severely SOB just going up the front stairs and pt brought back to ED. Patient's dyspnea improved while in ED.  She had mild AKI.   AKI resolved.  Medically stable for discharge pending SNF.[name] Kitchen  Now willing to go to SNF.   Subjective: Seen and examined earlier this morning.  No major events overnight of this morning.  Tearful about her dog and late brother.  Discussed the rationale behind minimum oxygen use given COPD.  Objective: Vitals:   11/17/22 0433 11/17/22 0817 11/17/22 0844 11/17/22 1240  BP: 125/71  (!) 104/55 113/62  Pulse: 60 62 63 73  Resp: 18 18  19   Temp: 97.8 F (36.6 C)   98.5 F (36.9 C)  TempSrc: Oral   Oral  SpO2: 100% 100%    Weight:      Height:        Examination:  GENERAL: No apparent distress.   Nontoxic. HEENT: MMM.  Vision and hearing grossly intact.  NECK: Supple.  No apparent JVD.  RESP:  No IWOB.  Fair aeration bilaterally. CVS:  RRR. Heart sounds normal.  ABD/GI/GU: BS+. Abd soft, NTND.  MSK/EXT:   No apparent deformity. Moves extremities. No edema.  SKIN: no apparent skin lesion or wound NEURO: Awake and alert. Oriented appropriately.  No apparent focal neuro deficit. PSYCH: Calm. Normal affect.   Procedures:  None  Microbiology summarized: None  Assessment and plan: Principal Problem:   Dyspnea on exertion Active Problems:   Acute kidney injury superimposed on CKD   CAD (coronary artery disease) of bypass graft   Paroxysmal atrial fibrillation (HCC); CHA2DS2VASc score F, HTN, CAD, CVA = 5   Type 2 diabetes mellitus with hyperlipidemia   Hypotension   DOE/COPD/chronic hypoxic RF on 5-7 L at baseline: Improved. -Continue home inhalers  -Minimum oxygen to keep saturation above 88% regardless of home 5 to 7 L.  Chest pain/elevated troponin/CAD s/p CABG with multiple stenting: Felt to be demand ischemia -Previously evaluated by cardiology who recommended medical mgt -Continue Plavix, ranolazine, Crestor, metoprolol and Imdur -Outpatient follow-up with cardiology   Chronic combined CHF: TTE as above.  Diuresed with IV Lasix and discharged on p.o. Lasix previous hosp. diuretics held for AKI.  AKI resolved. -Resume Lasix at  40 mg daily -GDMT limited by soft BP. -Strict intake and output, daily weight, renal functions and electrolytes   AKI on CKD-3A/Hx of renal transplant: AKI resolved. Recent Labs    11/07/22 0125 11/07/22 2332 11/09/22 0222 11/10/22 0249 11/11/22 2112 11/12/22 0928 11/13/22 1003 11/15/22 0229 11/16/22 0201 11/17/22 0701  BUN 13 15 14 12  30* 27* 21 16 15 15   CREATININE 1.04* 1.10* 0.93 0.88 1.48* 1.27* 1.13* 1.02* 1.23* 0.99  -Avoid hypotension. -Continue home Imuran and prednisone  Hypotension: Likely iatrogenic from BP meds,  opiate and benzo.  Resolved.  Uncontrolled IDDM-2 with hyperglycemia: A1c 8.5% in 05/2022. Recent Labs  Lab 11/16/22 1241 11/16/22 1715 11/16/22 2129 11/17/22 0745 11/17/22 1238  GLUCAP 86 162* 187* 148* 184*  -Continue Semglee 12 units daily, NovoLog 3 units 3 times daily with meals and SSI-sensitive -Follow hemoglobin A1c  Paroxysmal A-fib -Metoprolol, Eliquis   Debility: Therapy recommended SNF but patient chose to go home with home health previous hospitalization.  Now willing to go to SNF -[facility] consulted.   Anxiety and depression: Poorly controlled. -Vraylar, Lamictal, Ativan, Zoloft   Chronic back pain, narcotic dependent: Narcotic database reviewed. -Oxycodone, Flexeril  Grief: Lost her brother recently. -Emotional support -Chaplain consult if interested.  Morbid Obesity:  Body mass index is 38.49 kg/m.  Pressure Skin Injury: POA Pressure Injury 11/06/22 Sacrum Mid;Lower Stage 2 -  Partial thickness loss of dermis presenting as a shallow open injury with a red, pink wound bed without slough. (Active)  11/06/22 2000  Location: Sacrum  Location Orientation: Mid;Lower  Staging: Stage 2 -  Partial thickness loss of dermis presenting as a shallow open injury with a red, pink wound bed without slough.  Wound Description (Comments):   Present on Admission: Yes  Dressing Type Foam - Lift dressing to assess site every shift 11/11/22 0800   DVT prophylaxis:   apixaban (ELIQUIS) tablet 5 mg  Code Status: SNR/DNI Family Communication: None at bedside Level of care: Telemetry Cardiac Status is: Inpatient The patient will remain inpatient because: Safe disposition   Final disposition: SNF Consultants:  None  35 minutes with more than 50% spent in reviewing records, counseling patient/family and coordinating care.   Sch Meds:  Scheduled Meds:  apixaban  5 mg Oral BID   arformoterol  15 mcg Nebulization BID   azaTHIOprine  125 mg Oral Daily   budesonide  2 mL  Nebulization BID   clopidogrel  75 mg Oral Daily   empagliflozin  10 mg Oral Daily   fluticasone  2 spray Each Nare Daily   furosemide  40 mg Oral Daily   insulin aspart  0-9 Units Subcutaneous TID WC   insulin aspart  3 Units Subcutaneous TID WC   insulin glargine-yfgn  12 Units Subcutaneous Daily   isosorbide mononitrate  30 mg Oral Daily   lamoTRIgine  25 mg Oral QHS   metoprolol tartrate  50 mg Oral BID   montelukast  10 mg Oral QHS   multivitamin with minerals  1 tablet Oral Daily   pantoprazole  40 mg Oral Daily   predniSONE  5 mg Oral Q breakfast   ranolazine  1,000 mg Oral BID   rosuvastatin  20 mg Oral Daily   sertraline  200 mg Oral Daily   Continuous Infusions: PRN Meds:.acetaminophen **OR** acetaminophen, albuterol, bisacodyl, cyclobenzaprine, gabapentin, LORazepam, melatonin, nitroGLYCERIN, ondansetron **OR** ondansetron (ZOFRAN) IV, oxyCODONE  Antimicrobials: Anti-infectives (From admission, onward)    None  I have personally reviewed the following labs and images: CBC: Recent Labs  Lab 11/11/22 2112 11/12/22 0928 11/13/22 1003 11/15/22 0229 11/16/22 0201  WBC 7.1 6.8 5.4 5.7 6.4  HGB 9.6* 9.8* 10.1* 8.1* 9.2*  HCT 29.2* 30.8* 32.2* 24.6* 29.7*  MCV 96.7 97.2 99.1 97.6 100.0  PLT 208 189 220 188 254   BMP &GFR Recent Labs  Lab 11/12/22 0928 11/13/22 1003 11/15/22 0229 11/16/22 0201 11/17/22 0701  NA 135 135 133* 130* 133*  K 4.2 3.9 4.3 4.2 4.0  CL 95* 95* 99 96* 101  CO2 26 26 27 26  21*  GLUCOSE 129* 126* 170* 148* 151*  BUN 27* 21 16 15 15   CREATININE 1.27* 1.13* 1.02* 1.23* 0.99  CALCIUM 9.1 9.0 8.4* 8.9 8.7*  MG  --  1.8 1.9 2.1 2.0  PHOS  --  3.5 3.8 4.3 4.5   Estimated Creatinine Clearance: 58.7 mL/min (by C-G formula based on SCr of 0.99 mg/dL). Liver & Pancreas: Recent Labs  Lab 11/13/22 1003 11/15/22 0229 11/16/22 0201 11/17/22 0701  ALBUMIN 2.5* 2.1* 2.4* 2.4*   No results for input(s): "LIPASE", "AMYLASE" in the  last 168 hours. No results for input(s): "AMMONIA" in the last 168 hours. Diabetic: Recent Labs    11/15/22 0229  HGBA1C 8.3*   Recent Labs  Lab 11/16/22 1241 11/16/22 1715 11/16/22 2129 11/17/22 0745 11/17/22 1238  GLUCAP 86 162* 187* 148* 184*   Cardiac Enzymes: No results for input(s): "CKTOTAL", "CKMB", "CKMBINDEX", "TROPONINI" in the last 168 hours. No results for input(s): "PROBNP" in the last 8760 hours. Coagulation Profile: No results for input(s): "INR", "PROTIME" in the last 168 hours.  Thyroid Function Tests: No results for input(s): "TSH", "T4TOTAL", "FREET4", "T3FREE", "THYROIDAB" in the last 72 hours. Lipid Profile: No results for input(s): "CHOL", "HDL", "LDLCALC", "TRIG", "CHOLHDL", "LDLDIRECT" in the last 72 hours. Anemia Panel: No results for input(s): "VITAMINB12", "FOLATE", "FERRITIN", "TIBC", "IRON", "RETICCTPCT" in the last 72 hours. Urine analysis:    Component Value Date/Time   COLORURINE AMBER (A) 11/06/2022 1322   APPEARANCEUR CLEAR 11/06/2022 1322   LABSPEC 1.015 11/06/2022 1322   PHURINE 5.0 11/06/2022 1322   GLUCOSEU 50 (A) 11/06/2022 1322   GLUCOSEU 250 (A) 06/16/2022 1559   HGBUR NEGATIVE 11/06/2022 1322   BILIRUBINUR NEGATIVE 11/06/2022 1322   KETONESUR NEGATIVE 11/06/2022 1322   PROTEINUR NEGATIVE 11/06/2022 1322   UROBILINOGEN 0.2 06/16/2022 1559   NITRITE NEGATIVE 11/06/2022 1322   LEUKOCYTESUR NEGATIVE 11/06/2022 1322   Sepsis Labs: Invalid input(s): "PROCALCITONIN", "LACTICIDVEN"  Microbiology: No results found for this or any previous visit (from the past 240 hour(s)).  Radiology Studies: No results found.    Lizzett Nobile T. Hernandez  If 7PM-7AM, please contact night-coverage [email or web address] 11/17/2022, 2:44 PM

## 2022-11-17 NOTE — TOC Progression Note (Addendum)
Transition of Care [facility]) - Progression Note    Patient Details  Name: Margareth Medeiros MRN: [id number] Date of Birth: 1956/05/21  [facility]) CM/SW [place],  Phone Number: 11/17/2022, 10:14 AM  Clinical Narrative:     CSW provided pt with bed offers and medicare star ratings. Pt chose [facility].   [facility] confirmed bed offer. They will start auth.   Pasrr received:  [id number] E Expires 12/17/22  Expected Discharge Plan: [place] Barriers to Discharge: Insurance Authorization  Expected Discharge Plan and Services                                               Social Determinants of Health (SDOH) Interventions SDOH Screenings   Food Insecurity: No Food Insecurity (11/12/2022)  Housing: Low Risk  (11/12/2022)  Transportation Needs: Unmet Transportation Needs (11/12/2022)  Utilities: At Risk (11/12/2022)  Alcohol Screen: Low Risk  (12/10/2021)  Depression (PHQ2-9): Medium Risk (02/02/2022)  Tobacco Use: Medium Risk (11/12/2022)    Readmission Risk Interventions    11/09/2022    2:22 PM 12/12/2021   10:01 AM 09/23/2021    4:39 PM  Readmission Risk Prevention Plan  Transportation Screening Complete Complete Complete  Medication Review ([place]) Referral to Pharmacy Complete Complete  PCP or Specialist appointment within 3-5 days of discharge  Complete Complete  HRI or [place] Complete Complete Complete  SW Recovery Care/Counseling Consult Complete Complete Complete  Palliative Care Screening Not Applicable Not Applicable Complete  [place] Patient Refused Not Applicable Not Applicable

## 2022-11-17 NOTE — Progress Notes (Signed)
Physical Therapy Treatment Patient Details Name: Katherine [name] MRN: [id number] DOB: [birth year] Today's Date: 11/17/2022   History of Present Illness Pt is 67 year old presented to [facility] on  11/11/22 for SOB. Pt had just been dc'd home from hospital and [facility] called EMS due to pt reporting chest pain and SOB on arrival home. PMH - ESRD s/p renal transplant on chronic immunosuppression, CAD, CABG, afib, chronic diastolic CHF, COPD on home O2, DM, HTN, depression/anxiety, chronic pain    PT Comments    Pt tolerated treatment well today. Pt was able to increase ambulation in hallway with RW compared to previous session. Pt anticipates DC to SNF tomorrow. No change in DC/DME recs at this time. PT will continue to follow.  Recommendations for follow up therapy are one component of a multi-disciplinary discharge planning process, led by the attending physician.  Recommendations may be updated based on patient status, additional functional criteria and insurance authorization.  Follow Up Recommendations  Can patient physically be transported by private vehicle: Yes    Assistance Recommended at Discharge Intermittent Supervision/Assistance  Patient can return home with the following A little help with walking and/or transfers;A little help with bathing/dressing/bathroom;Assist for transportation;Help with stairs or ramp for entrance   Equipment Recommendations  None recommended by PT    Recommendations for Other Services       Precautions / Restrictions Precautions Precautions: Fall Restrictions Weight Bearing Restrictions: No     Mobility  Bed Mobility               General bed mobility comments: up in recliner    Transfers Overall transfer level: Needs assistance Equipment used: Rolling walker (2 wheels) Transfers: Sit to/from Stand Sit to Stand: Supervision                Ambulation/Gait Ambulation/Gait assistance: Min guard Gait Distance (Feet): 30  Feet Assistive device: Rolling walker (2 wheels) Gait Pattern/deviations: Step-through pattern, Decreased step length - right, Decreased step length - left, Decreased stride length Gait velocity: decreased     General Gait Details: Pt overall steady however fatigues quickly.   Stairs             Wheelchair Mobility    Modified Rankin (Stroke Patients Only)       Balance Overall balance assessment: Needs assistance Sitting-balance support: Feet supported, No upper extremity supported Sitting balance-Leahy Scale: Good Sitting balance - Comments: Seated in chair   Standing balance support: Reliant on assistive device for balance Standing balance-Leahy Scale: Poor Standing balance comment: Reliant on RW                            Cognition Arousal/Alertness: Awake/alert Behavior During Therapy: WFL for tasks assessed/performed Overall Cognitive Status: Within Functional Limits for tasks assessed                                          Exercises      General Comments General comments (skin integrity, edema, etc.): SpO2 down to 80% on 6L. Pt titrated to 8L during ambulation however unable to obtain SpO2 reading. Pt left on 7L at 92%. Pt required hot pack in hand in order to obtain SpO2 reading.      Pertinent Vitals/Pain      Home Living  Prior Function            PT Goals (current goals can now be found in the care plan section) Progress towards PT goals: Progressing toward goals    Frequency    Min 1X/week      PT Plan Current plan remains appropriate    Co-evaluation              AM-PAC PT "6 Clicks" Mobility   Outcome Measure  Help needed turning from your back to your side while in a flat bed without using bedrails?: None Help needed moving from lying on your back to sitting on the side of a flat bed without using bedrails?: A Little Help needed moving to and from a bed  to a chair (including a wheelchair)?: A Little Help needed standing up from a chair using your arms (e.g., wheelchair or bedside chair)?: A Little Help needed to walk in hospital room?: A Little Help needed climbing 3-5 steps with a railing? : Total 6 Click Score: 17    End of Session Equipment Utilized During Treatment: Gait belt;Oxygen Activity Tolerance: Patient limited by fatigue Patient left: in chair;with call bell/phone within reach;with chair alarm set Nurse Communication: Mobility status PT Visit Diagnosis: Other abnormalities of gait and mobility (R26.89);Muscle weakness (generalized) (M62.81);History of falling (Z91.81)     Time: [id number] PT Time Calculation (min) (ACUTE ONLY): 29 min  Charges:  $Gait Training: 8-22 mins $Therapeutic Activity: 8-22 mins                     Shelby Mattocks, PT, DPT Acute Rehab Services [id number]    Viann Shove 11/17/2022, 2:09 PM

## 2022-11-18 ENCOUNTER — Telehealth: Payer: Self-pay

## 2022-11-18 DIAGNOSIS — R0609 Other forms of dyspnea: Secondary | ICD-10-CM | POA: Diagnosis not present

## 2022-11-18 DIAGNOSIS — N179 Acute kidney failure, unspecified: Secondary | ICD-10-CM | POA: Diagnosis not present

## 2022-11-18 DIAGNOSIS — I480 Paroxysmal atrial fibrillation: Secondary | ICD-10-CM | POA: Diagnosis not present

## 2022-11-18 DIAGNOSIS — E1169 Type 2 diabetes mellitus with other specified complication: Secondary | ICD-10-CM | POA: Diagnosis not present

## 2022-11-18 LAB — GLUCOSE, CAPILLARY
Glucose-Capillary: 139 mg/dL — ABNORMAL HIGH (ref 70–99)
Glucose-Capillary: 156 mg/dL — ABNORMAL HIGH (ref 70–99)
Glucose-Capillary: 124 mg/dL — ABNORMAL HIGH (ref 70–99)
Glucose-Capillary: 221 mg/dL — ABNORMAL HIGH (ref 70–99)

## 2022-11-18 MED ORDER — GUAIFENESIN-DM 100-10 MG/5ML PO SYRP
15.0000 mL | ORAL_SOLUTION | ORAL | Status: DC | PRN
  Administered 2022-11-18 (×2): 15 mL via ORAL
  Filled 2022-11-18 (×2): qty 15

## 2022-11-18 NOTE — Telephone Encounter (Signed)
Reminder letter has been mailed to the patient

## 2022-11-18 NOTE — Progress Notes (Signed)
Mobility Specialist Progress Note:   11/18/22 1225  Mobility  Activity Ambulated with assistance in hallway  Level of Assistance Contact guard assist, steadying assist  Assistive Device Front wheel walker  Distance Ambulated (ft) 55 ft  Activity Response Tolerated well  Mobility Referral Yes  $Mobility charge 1 Mobility   Pt agreeable to mobility session. Required minG assist throughout ambulation. SpO2 WFL on 8LO2 (baseline 7L, no 7L setting on tank). Pt continues to be limited by generalized weakness and decr respiratory function. Pt left sitting in chair with all needs met.   Nelta Numbers Mobility Specialist Please contact via SecureChat or  Rehab office at [phone]

## 2022-11-18 NOTE — Care Management Important Message (Signed)
Important Message  Patient Details  Name: Katherine [name] MRN: [id number] Date of Birth: 13-Dec-1955   Medicare Important Message Given:  Yes     [name] 11/18/2022, 10:08 AM

## 2022-11-18 NOTE — TOC Progression Note (Signed)
Transition of Care [facility]) - Progression Note    Patient Details  Name: Katherine [name] MRN: [id number] Date of Birth: February 26, 1956  Transition of Care [facility]) CM/SW [place], [place] Phone Number: 11/18/2022, 10:10 AM  Clinical Narrative:     CSW spoke with Whitney with [facility] who informed CSW that patients insurance authorization is still pending for short term rehab. CSW informed MD.CSW will continue to follow and assist with patients dc planning needs.  Expected Discharge Plan: Skilled Nursing Facility Barriers to Discharge: Insurance Authorization  Expected Discharge Plan and Services                                               Social Determinants of Health (SDOH) Interventions SDOH Screenings   Food Insecurity: No Food Insecurity (11/12/2022)  Housing: Low Risk  (11/12/2022)  Transportation Needs: Unmet Transportation Needs (11/12/2022)  Utilities: At Risk (11/12/2022)  Alcohol Screen: Low Risk  (12/10/2021)  Depression (PHQ2-9): Medium Risk (02/02/2022)  Tobacco Use: Medium Risk (11/12/2022)    Readmission Risk Interventions    11/09/2022    2:22 PM 12/12/2021   10:01 AM 09/23/2021    4:39 PM  Readmission Risk Prevention Plan  Transportation Screening Complete Complete Complete  Medication Review ([place]) Referral to Pharmacy Complete Complete  PCP or Specialist appointment within 3-5 days of discharge  Complete Complete  HRI or [place] Complete Complete Complete  SW Recovery Care/Counseling Consult Complete Complete Complete  Palliative Care Screening Not Applicable Not Applicable Complete  [name] Patient Refused Not Applicable Not Applicable

## 2022-11-18 NOTE — Progress Notes (Signed)
[facility]                                                                               Katherine [name], is a 67 y.o. 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   Patient was hospitalized from 3/22-3/27 for NSTEMI / acute on chronic combined CHF.  Troponin trended from 27-428.  Cardiology consulted, pt not felt to be candidate for LHC due to vascular access issues and transplanted kidney.  TTE with LVEF of 35 to 40%, dyskinesis of mid to distal inferior septal wall and entire apex. Patient was diuresed with IV Lasix and cleared for discharge by cardiology on p.o. Lasix 40 mg twice daily.  [name] was initially added but discontinued on discharge due to soft blood pressures.  Imdur was reduced to 30 mg daily as well.  Therapy recommended SNF but patient chose to go home with home health and DME.  Unfortunately, she didn't do well at all after EMS got to her house.  She became severely SOB just going up the front stairs and pt brought back to ED. Patient's dyspnea improved while in ED.  She had mild AKI.   AKI resolved.  Medically stable for discharge pending SNF.[name] Kitchen  Now willing to go to SNF.   Assessment & Plan    Assessment and Plan:  DOE/COPD/chronic hypoxic RF on 5-7 L at baseline: Improved. -Continue home inhalers     Chest pain/elevated troponin/CAD s/p CABG with multiple stenting: Felt to be demand ischemia -Previously evaluated by cardiology who recommended medical mgt -Continue Plavix, ranolazine, Crestor, metoprolol and Imdur -Outpatient follow-up with cardiology   Chronic combined CHF: TTE as above.  Diuresed with IV Lasix and discharged on p.o.  Lasix previous hosp. diuretics held for AKI.  AKI resolved. -Resume Lasix at 40 mg daily -GDMT limited by soft BP. -Strict intake and output, daily weight, renal functions and electrolytes   AKI on CKD-3A/Hx of renal transplant: AKI resolved.  -Continue home Imuran and prednisone   Hypotension: Likely iatrogenic from BP meds, opiate and benzo.  Resolved.   Uncontrolled IDDM-2 with hyperglycemia: A1c 8.5% in 05/2022. Continue with SSI and semglee.    Paroxysmal A-fib -Metoprolol, Eliquis   Debility: Therapy recommended SNF but patient chose to go home with home health previous hospitalization.  Now willing to go to SNF -[facility] consulted.   Anxiety and depression: Poorly controlled. -Vraylar, Lamictal, Ativan, Zoloft   Chronic back pain, narcotic dependent: Narcotic database reviewed. -Oxycodone, Flexeril   Grief: Lost her brother recently. -Emotional support -Chaplain consult if interested.   Morbid Obesity:  Body mass index is 38.49 kg/m.   Pressure Skin Injury: POA Stage 2 sacral pressure injury.    apixaban (ELIQUIS) tablet 5 mg  Code Status: SNR/DNI Family Communication: None at bedside Level of care: Telemetry Cardiac  Status is: Inpatient The patient will remain inpatient because: Safe disposition    Code Status: DNR.  DVT Prophylaxis:   apixaban (ELIQUIS) tablet 5 mg   Level of Care: Level of care: Telemetry Cardiac Family Communication: none   Disposition Plan:     Remains inpatient appropriate:  SNF placement.   Procedures:    Consultants:   None.   Antimicrobials:   Anti-infectives (From admission, onward)    None        Medications  Scheduled Meds:  apixaban  5 mg Oral BID   arformoterol  15 mcg Nebulization BID   azaTHIOprine  125 mg Oral Daily   budesonide  2 mL Nebulization BID   clopidogrel  75 mg Oral Daily   empagliflozin  10 mg Oral Daily   fluticasone  2 spray Each Nare Daily   furosemide  40 mg Oral Daily   insulin aspart   0-9 Units Subcutaneous TID WC   insulin aspart  3 Units Subcutaneous TID WC   insulin glargine-yfgn  12 Units Subcutaneous Daily   isosorbide mononitrate  30 mg Oral Daily   lamoTRIgine  25 mg Oral QHS   metoprolol tartrate  50 mg Oral BID   montelukast  10 mg Oral QHS   multivitamin with minerals  1 tablet Oral Daily   pantoprazole  40 mg Oral Daily   predniSONE  5 mg Oral Q breakfast   ranolazine  1,000 mg Oral BID   rosuvastatin  20 mg Oral Daily   sertraline  200 mg Oral Daily   Continuous Infusions: PRN Meds:.acetaminophen **OR** acetaminophen, albuterol, bisacodyl, cyclobenzaprine, gabapentin, guaiFENesin-dextromethorphan, LORazepam, melatonin, nitroGLYCERIN, ondansetron **OR** ondansetron (ZOFRAN) IV, oxyCODONE    Subjective:   Katherine [name] was seen and examined today. No new complaints.   Objective:   Vitals:   11/18/22 0809 11/18/22 0811 11/18/22 0921 11/18/22 1625  BP:   (!) 130/56 103/60  Pulse: 66  72 67  Resp: 18   17  Temp:    98.2 F (36.8 C)  TempSrc:    Oral  SpO2: 98% 98%  92%  Weight:      Height:        Intake/Output Summary (Last 24 hours) at 11/18/2022 1649 Last data filed at 11/18/2022 1324 Gross per 24 hour  Intake 237 ml  Output 200 ml  Net 37 ml   Filed Weights   11/12/22 0811 11/12/22 0857  Weight: 80 kg 92.4 kg     Exam General: Alert and oriented x 3, NAD Cardiovascular: S1 S2 auscultated, no murmurs, RRR Respiratory: Clear to auscultation bilaterally, no wheezing, rales or rhonchi Gastrointestinal: Soft, nontender, nondistended, + bowel sounds Ext: no pedal edema bilaterally Neuro: AAOx3, Cr N's II- XII. Strength 5/5 upper and lower extremities bilaterally Skin: No rashes Psych: Normal affect and demeanor, alert and oriented x3    Data Reviewed:  I have personally reviewed following labs and imaging studies   CBC Lab Results  Component Value Date   WBC 6.4 11/16/2022   RBC 2.97 (L) 11/16/2022   HGB 9.2 (L) 11/16/2022    HCT 29.7 (L) 11/16/2022   MCV 100.0 11/16/2022   MCH 31.0 11/16/2022   PLT 254 11/16/2022   MCHC 31.0 11/16/2022   RDW 18.8 (H) 11/16/2022   LYMPHSABS 1.1 11/06/2022   MONOABS 0.4 11/06/2022   EOSABS 0.1 11/06/2022   BASOSABS 0.0 123XX123     Last metabolic panel Lab Results  Component Value Date   NA 133 (L)  11/17/2022   K 4.0 11/17/2022   CL 101 11/17/2022   CO2 21 (L) 11/17/2022   BUN 15 11/17/2022   CREATININE 0.99 11/17/2022   GLUCOSE 151 (H) 11/17/2022   GFRNONAA >60 11/17/2022   GFRAA 58 (L) 04/12/2020   CALCIUM 8.7 (L) 11/17/2022   PHOS 4.5 11/17/2022   PROT 6.4 (L) 11/06/2022   ALBUMIN 2.4 (L) 11/17/2022   BILITOT 0.7 11/06/2022   ALKPHOS 82 11/06/2022   AST 17 11/06/2022   ALT 7 11/06/2022   ANIONGAP 11 11/17/2022    CBG (last 3)  Recent Labs    11/18/22 0730 11/18/22 1202 11/18/22 1627  GLUCAP 139* 156* 124*      Coagulation Profile: No results for input(s): "INR", "PROTIME" in the last 168 hours.   Radiology Studies: No results found.     Hosie Poisson M.D. [facility] 11/18/2022, 4:49 PM  Available via Epic secure chat 7am-7pm After 7 pm, please refer to night coverage provider listed on amion.

## 2022-11-19 ENCOUNTER — Inpatient Hospital Stay (HOSPITAL_COMMUNITY): Payer: Medicare Other

## 2022-11-19 ENCOUNTER — Telehealth: Payer: Self-pay | Admitting: Internal Medicine

## 2022-11-19 DIAGNOSIS — R0609 Other forms of dyspnea: Secondary | ICD-10-CM | POA: Diagnosis not present

## 2022-11-19 DIAGNOSIS — N179 Acute kidney failure, unspecified: Secondary | ICD-10-CM | POA: Diagnosis not present

## 2022-11-19 DIAGNOSIS — I480 Paroxysmal atrial fibrillation: Secondary | ICD-10-CM | POA: Diagnosis not present

## 2022-11-19 DIAGNOSIS — E1169 Type 2 diabetes mellitus with other specified complication: Secondary | ICD-10-CM | POA: Diagnosis not present

## 2022-11-19 LAB — CBC WITH DIFFERENTIAL/PLATELET
Abs Immature Granulocytes: 0.02 10*3/uL (ref 0.00–0.07)
Basophils Absolute: 0 10*3/uL (ref 0.0–0.1)
Basophils Relative: 0 %
Eosinophils Absolute: 0.1 10*3/uL (ref 0.0–0.5)
Eosinophils Relative: 2 %
HCT: 34.2 % — ABNORMAL LOW (ref 36.0–46.0)
Hemoglobin: 10.9 g/dL — ABNORMAL LOW (ref 12.0–15.0)
Immature Granulocytes: 0 %
Lymphocytes Relative: 13 %
Lymphs Abs: 0.7 10*3/uL (ref 0.7–4.0)
MCH: 31 pg (ref 26.0–34.0)
MCHC: 31.9 g/dL (ref 30.0–36.0)
MCV: 97.2 fL (ref 80.0–100.0)
Monocytes Absolute: 0.6 10*3/uL (ref 0.1–1.0)
Monocytes Relative: 10 %
Neutro Abs: 4.2 10*3/uL (ref 1.7–7.7)
Neutrophils Relative %: 75 %
Platelets: 356 10*3/uL (ref 150–400)
RBC: 3.52 MIL/uL — ABNORMAL LOW (ref 3.87–5.11)
RDW: 18.9 % — ABNORMAL HIGH (ref 11.5–15.5)
WBC: 5.6 10*3/uL (ref 4.0–10.5)
nRBC: 0 % (ref 0.0–0.2)

## 2022-11-19 LAB — COMPREHENSIVE METABOLIC PANEL
ALT: 14 U/L (ref 0–44)
AST: 21 U/L (ref 15–41)
Albumin: 2.9 g/dL — ABNORMAL LOW (ref 3.5–5.0)
Alkaline Phosphatase: 98 U/L (ref 38–126)
Anion gap: 13 (ref 5–15)
BUN: 8 mg/dL (ref 8–23)
CO2: 28 mmol/L (ref 22–32)
Calcium: 9.1 mg/dL (ref 8.9–10.3)
Chloride: 97 mmol/L — ABNORMAL LOW (ref 98–111)
Creatinine, Ser: 1.13 mg/dL — ABNORMAL HIGH (ref 0.44–1.00)
GFR, Estimated: 54 mL/min — ABNORMAL LOW (ref >60)
Glucose, Bld: 153 mg/dL — ABNORMAL HIGH (ref 70–99)
Potassium: 3.8 mmol/L (ref 3.5–5.1)
Sodium: 138 mmol/L (ref 135–145)
Total Bilirubin: 0.8 mg/dL (ref 0.3–1.2)
Total Protein: 7.4 g/dL (ref 6.5–8.1)

## 2022-11-19 LAB — GLUCOSE, CAPILLARY
Glucose-Capillary: 152 mg/dL — ABNORMAL HIGH (ref 70–99)
Glucose-Capillary: 156 mg/dL — ABNORMAL HIGH (ref 70–99)
Glucose-Capillary: 131 mg/dL — ABNORMAL HIGH (ref 70–99)
Glucose-Capillary: 179 mg/dL — ABNORMAL HIGH (ref 70–99)
Glucose-Capillary: 234 mg/dL — ABNORMAL HIGH (ref 70–99)

## 2022-11-19 MED ORDER — SODIUM CHLORIDE 0.9 % IV SOLN
6.2500 mg | Freq: Four times a day (QID) | INTRAVENOUS | Status: DC | PRN
  Administered 2022-11-19: 6.25 mg via INTRAVENOUS
  Filled 2022-11-19: qty 0.25

## 2022-11-19 MED ORDER — ALBUTEROL SULFATE (2.5 MG/3ML) 0.083% IN NEBU
2.5000 mg | INHALATION_SOLUTION | Freq: Two times a day (BID) | RESPIRATORY_TRACT | Status: DC
  Administered 2022-11-19 (×2): 2.5 mg via RESPIRATORY_TRACT
  Filled 2022-11-19 (×2): qty 3

## 2022-11-19 NOTE — Telephone Encounter (Signed)
Patient called to cancel next Tuesday's appointment because she is currently hospitalized and expects to be discharged to inpatient rehab. She also wanted Dr. Ronnald Ramp to be aware that she has not been able to see the surgeon yet for her arm (appointment is scheduled for 5/15), and she is concerned that she might lose the arm. She wanted provider to be aware.  Call back number is [phone]

## 2022-11-19 NOTE — Progress Notes (Signed)
Patient reports nausea to this nurse, PRN Zofran given (see eMAR). This nurse informed patient that PO medications would be given after Zofran has taken effect. Patient requests her PO meds at this time, including PRN Oxycodone, Flexeril, and Ativan. This nurse educated patient on risk of feeling nauseous and taking medications on an empty stomach and patient replied "honey, I've been doing this for years. I want my meds now." Scheduled and PRN meds administered per patient request.

## 2022-11-19 NOTE — Progress Notes (Signed)
[facility]                                                                               Katherine [name], is a 67 y.o. 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   Patient was hospitalized from 3/22-3/27 for NSTEMI / acute on chronic combined CHF.  Troponin trended from 27-428.  Cardiology consulted, pt not felt to be candidate for LHC due to vascular access issues and transplanted kidney.  TTE with LVEF of 35 to 40%, dyskinesis of mid to distal inferior septal wall and entire apex. Patient was diuresed with IV Lasix and cleared for discharge by cardiology on p.o. Lasix 40 mg twice daily.  Katherine [name] was initially added but discontinued on discharge due to soft blood pressures.  Imdur was reduced to 30 mg daily as well.  Therapy recommended SNF but patient chose to go home with home health and DME.  Unfortunately, she didn't do well at all after EMS got to her house.  She became severely SOB just going up the front stairs and pt brought back to ED. Patient's dyspnea improved while in ED.  She had mild AKI.   AKI resolved.  Medically stable for discharge pending SNF.[name] Kitchen  Now willing to go to SNF.   Assessment & Plan    Assessment and Plan:  DOE/COPD/chronic hypoxic RF on 5-7 L at baseline: Pt more sob this am. Get CXR and wean oxygen from 9 lit/min to 7 lit/min.  -Continue home inhalers    Chest pain/elevated troponin/CAD s/p CABG with multiple stenting: Felt to be demand ischemia -Previously evaluated by cardiology who recommended medical mgt -Continue Plavix, ranolazine, Crestor, metoprolol and Imdur -Outpatient follow-up with cardiology   Chronic combined CHF:  TTE as above.   Diuresed appropriately.  -Resume Lasix at 40 mg daily -GDMT limited by soft BP. -continue with strict intake and output and daily weights.    AKI on CKD-3A/Hx of renal transplant: AKI resolved. -Continue home Imuran and prednisone   Hypotension:  Resolved.    Uncontrolled IDDM-2 with hyperglycemia: A1c 8.5% in 05/2022. CBG (last 3)  Recent Labs    11/19/22 0733 11/19/22 0823 11/19/22 1208  GLUCAP 156* 152* 131*   Continue with SSI and semglee.    Paroxysmal A-fib Rate controlled.  -Metoprolol, Eliquis   Debility: Therapy recommended SNF but patient chose to go home with home health previous hospitalization.  Now willing to go to SNF -[name] consulted.   Anxiety and depression: Poorly controlled. -Vraylar, Lamictal, Ativan, Zoloft   Chronic back pain, narcotic dependent: Narcotic database reviewed. -Oxycodone, Flexeril   Grief: Lost her brother recently. -Emotional support -Chaplain consult if interested.   Morbid Obesity:  Body mass index is 38.49 kg/m.   Pressure Skin Injury: POA Stage 2 sacral pressure injury.  Pressure Injury 11/06/22  Sacrum Mid;Lower Stage 2 -  Partial thickness loss of dermis presenting as a shallow open injury with a red, pink wound bed without slough. (Active)  11/06/22 2000  Location: Sacrum  Location Orientation: Mid;Lower  Staging: Stage 2 -  Partial thickness loss of dermis presenting as a shallow open injury with a red, pink wound bed without slough.  Wound Description (Comments):   Present on Admission: Yes    Wound care .   Nausea:  Prn phenergan.    apixaban (ELIQUIS) tablet 5 mg  Code Status: SNR/DNI Family Communication: None at bedside Level of care: Telemetry Cardiac Status is: Inpatient The patient will remain inpatient because: Safe disposition/ SNF Placement , awaiting for insurance authorization.     Code Status: DNR.  DVT Prophylaxis:   apixaban (ELIQUIS) tablet 5 mg   Level of Care: Level of  care: Telemetry Cardiac Family Communication: none   Disposition Plan:     Remains inpatient appropriate:  SNF placement.   Procedures:  None.   Consultants:   None.   Antimicrobials:   Anti-infectives (From admission, onward)    None        Medications  Scheduled Meds:  albuterol  2.5 mg Nebulization BID   apixaban  5 mg Oral BID   arformoterol  15 mcg Nebulization BID   azaTHIOprine  125 mg Oral Daily   budesonide  2 mL Nebulization BID   clopidogrel  75 mg Oral Daily   empagliflozin  10 mg Oral Daily   fluticasone  2 spray Each Nare Daily   furosemide  40 mg Oral Daily   insulin aspart  0-9 Units Subcutaneous TID WC   insulin aspart  3 Units Subcutaneous TID WC   insulin glargine-yfgn  12 Units Subcutaneous Daily   isosorbide mononitrate  30 mg Oral Daily   lamoTRIgine  25 mg Oral QHS   metoprolol tartrate  50 mg Oral BID   montelukast  10 mg Oral QHS   multivitamin with minerals  1 tablet Oral Daily   pantoprazole  40 mg Oral Daily   predniSONE  5 mg Oral Q breakfast   ranolazine  1,000 mg Oral BID   rosuvastatin  20 mg Oral Daily   sertraline  200 mg Oral Daily   Continuous Infusions:  promethazine (PHENERGAN) injection (IM or IVPB)     PRN Meds:.acetaminophen **OR** acetaminophen, albuterol, bisacodyl, cyclobenzaprine, gabapentin, guaiFENesin-dextromethorphan, LORazepam, melatonin, nitroGLYCERIN, oxyCODONE, promethazine (PHENERGAN) injection (IM or IVPB)    Subjective:   Katherine [name] was seen and examined today. Slightly nauseated. No vomiting. No abdominal pain.   Objective:   Vitals:   11/19/22 0335 11/19/22 0500 11/19/22 0741 11/19/22 1128  BP: 106/81 117/66    Pulse: 71 75    Resp: 16     Temp: 98.3 F (36.8 C)     TempSrc: Oral     SpO2: 97% (!) 78% (!) 84% (!) 84%  Weight:      Height:        Intake/Output Summary (Last 24 hours) at 11/19/2022 1436 Last data filed at 11/19/2022 0452 Gross per 24 hour  Intake --  Output 450 ml   Net -450 ml    Filed Weights   11/12/22 0811 11/12/22 0857  Weight: 80 kg 92.4 kg     Exam General exam: Ill appearing lady not in distress on 7- 9  lit of [place] oxygen.  Respiratory system: diminished at bases.  Cardiovascular system: S1 & S2 heard, RRR. No JVD,  Gastrointestinal system: Abdomen is nondistended, soft and nontender. Central nervous system: Alert and oriented. No focal neurological deficits. Extremities: able to move all extremities.  Skin: No rashes,  Psychiatry: mood is appropriate.     Data Reviewed:  I have personally reviewed following labs and imaging studies   CBC Lab Results  Component Value Date   WBC 6.4 11/16/2022   RBC 2.97 (L) 11/16/2022   HGB 9.2 (L) 11/16/2022   HCT 29.7 (L) 11/16/2022   MCV 100.0 11/16/2022   MCH 31.0 11/16/2022   PLT 254 11/16/2022   MCHC 31.0 11/16/2022   RDW 18.8 (H) 11/16/2022   LYMPHSABS 1.1 11/06/2022   MONOABS 0.4 11/06/2022   EOSABS 0.1 11/06/2022   BASOSABS 0.0 123XX123     Last metabolic panel Lab Results  Component Value Date   NA 133 (L) 11/17/2022   K 4.0 11/17/2022   CL 101 11/17/2022   CO2 21 (L) 11/17/2022   BUN 15 11/17/2022   CREATININE 0.99 11/17/2022   GLUCOSE 151 (H) 11/17/2022   GFRNONAA >60 11/17/2022   GFRAA 58 (L) 04/12/2020   CALCIUM 8.7 (L) 11/17/2022   PHOS 4.5 11/17/2022   PROT 6.4 (L) 11/06/2022   ALBUMIN 2.4 (L) 11/17/2022   BILITOT 0.7 11/06/2022   ALKPHOS 82 11/06/2022   AST 17 11/06/2022   ALT 7 11/06/2022   ANIONGAP 11 11/17/2022    CBG (last 3)  Recent Labs    11/19/22 0733 11/19/22 0823 11/19/22 1208  GLUCAP 156* 152* 131*       Coagulation Profile: No results for input(s): "INR", "PROTIME" in the last 168 hours.   Radiology Studies: No results found.     Hosie Poisson M.D. [facility] 11/19/2022, 2:36 PM  Available via Epic secure chat 7am-7pm After 7 pm, please refer to night coverage provider listed on amion.

## 2022-11-19 NOTE — Progress Notes (Signed)
At 2130 n rounding pt started to c/o left, right, mid upper chest pain. Pt rated pain 6/10, describing pain as constant aching, dull, and pressure in her chest. VS stable, nitroglycerin given at 2134, EKG completed, and O2 increased from 7l/min to 9 l/min for comfort, since pt c/o SOB as well. Claria Dice, MD notified at 2140. Pt in no acute distress, no change in mental status, VS remain stable. Pt requested her oxy and ativan which was given. Pt did not want to proceed with second nitro, pain 2/10 at this time. EKG available in Epic for review, and MD notified. At 2150, pt reported that pain has resoled completely. VS still stable, MD notified. Will continue to monitor pt closely.

## 2022-11-19 NOTE — TOC Progression Note (Signed)
Transition of Care [facility]) - Progression Note    Patient Details  Name: Katherine [name] MRN: [id number] Date of Birth: 1956-01-08  Transition of Care [facility]) CM/SW [place], [place] Phone Number: 11/19/2022, 10:16 AM  Clinical Narrative:     CSW spoke with Loree Fee with [facility] who informed CSW that patients insurance authorization is still pending. CSW informed MD. CSW will continue to follow and assist with patients dc planning needs.  Expected Discharge Plan: Skilled Nursing Facility Barriers to Discharge: Insurance Authorization  Expected Discharge Plan and Services                                               Social Determinants of Health (SDOH) Interventions SDOH Screenings   Food Insecurity: No Food Insecurity (11/12/2022)  Housing: Low Risk  (11/12/2022)  Transportation Needs: Unmet Transportation Needs (11/12/2022)  Utilities: At Risk (11/12/2022)  Alcohol Screen: Low Risk  (12/10/2021)  Depression (PHQ2-9): Medium Risk (02/02/2022)  Tobacco Use: Medium Risk (11/12/2022)    Readmission Risk Interventions    11/09/2022    2:22 PM 12/12/2021   10:01 AM 09/23/2021    4:39 PM  Readmission Risk Prevention Plan  Transportation Screening Complete Complete Complete  Medication Review ([place]) Referral to Pharmacy Complete Complete  PCP or Specialist appointment within 3-5 days of discharge  Complete Complete  HRI or [place] Complete Complete Complete  SW Recovery Care/Counseling Consult Complete Complete Complete  Palliative Care Screening Not Applicable Not Applicable Complete  [place] Patient Refused Not Applicable Not Applicable

## 2022-11-19 NOTE — Progress Notes (Signed)
Mobility Specialist Progress Note:   11/19/22 1510  Mobility  Activity Ambulated with assistance in hallway  Level of Assistance Contact guard assist, steadying assist  Assistive Device Front wheel walker  Distance Ambulated (ft) 60 ft  Activity Response Tolerated fair  Mobility Referral Yes  $Mobility charge 1 Mobility   Pt agreeable to mobility session. Required minG assist throughout ambulation. C/o legs feeling "wobbly" as distance incr. VSS on 10LO2. Pt back sitting EOB with all needs met.   Nelta Numbers Mobility Specialist Please contact via SecureChat or  Rehab office at [phone]

## 2022-11-19 NOTE — Progress Notes (Signed)
Occupational Therapy Treatment Patient Details Name: Katherine [name] MRN: [id number] DOB: 12-18-1955 Today's Date: 11/19/2022   History of present illness Pt is 67 year old presented to [facility] on  [facility]/11[facility] for SOB. Pt had just been dc'd home from hospital and PTAR called EMS due to pt reporting chest pain and SOB on arrival home. PMH - ESRD s/p renal transplant on chronic immunosuppression, CAD, CABG, afib, chronic diastolic CHF, COPD on home O2, DM, HTN, depression/anxiety, chronic pain   OT comments  Pt was seen for OT ADL retraining session with focus on toileting, transfers and bed mobility. Pt is overall Min guard-supervision for toileting transfers and Min A for hygeine. She is set up for grooming, Mod I for eating while seated at EOB. However, her O2 SATs fluctuated greatly from 84-94% on 9L O2 via HFNC. Discussed taking rest breaks PRN as well as pursed lip breathing techniques to bring O2 back up onto 90's. Pt also stated that she has been nauseous this morning and her RN is aware, gave meds per pt report. She should benefit from continued acute OT followed by SNF Rehab to assist in maximizing independence with ADL's and self care tasks.   Recommendations for follow up therapy are one component of a multi-disciplinary discharge planning process, led by the attending physician.  Recommendations may be updated based on patient status, additional functional criteria and insurance authorization.    Assistance Recommended at Discharge Frequent or constant Supervision/Assistance  Patient can return home with the following  A little help with walking and/or transfers;A lot of help with bathing/dressing/bathroom;Assistance with cooking/housework;Direct supervision/assist for medications management;Direct supervision/assist for financial management;Help with stairs or ramp for entrance;Assist for transportation   Equipment Recommendations  None recommended by OT    Recommendations for Other  Services  SNF rehab    Precautions / Restrictions Precautions Precautions: Fall Precaution Comments: watch O2 Restrictions Weight Bearing Restrictions: No       Mobility Bed Mobility Overal bed mobility: Needs Assistance Bed Mobility: Supine to Sit, Sit to Supine     Supine to sit: Supervision, HOB elevated Sit to supine: Supervision        Transfers Overall transfer level: Needs assistance Equipment used: Rolling walker (2 wheels) Transfers: Sit to/from Stand, Bed to chair/wheelchair/BSC Sit to Stand: Supervision, From elevated surface     Step pivot transfers: Supervision, Min guard       Balance Overall balance assessment: Needs assistance Sitting-balance support: Feet supported, No upper extremity supported Sitting balance-Leahy Scale: Good     Standing balance support: Reliant on assistive device for balance, Bilateral upper extremity supported Standing balance-Leahy Scale: Fair Standing balance comment: Reliant on RW     ADL either performed or assessed with clinical judgement   ADL Overall ADL's : Needs assistance/impaired Eating/Feeding: Independent;Sitting Eating/Feeding Details (indicate cue type and reason): Sitting at EOB to drink diet ginger Ale secondary to nausea Grooming: Wash/dry hands;Set up;Sitting   Lower Body Bathing: Sit to/from stand;Minimal assistance   Lower Body Dressing: Min guard;Minimal assistance;Moderate assistance Lower Body Dressing Details (indicate cue type and reason): Donn underwear after using 3:1 Toilet Transfer: Rolling walker (2 wheels);BSC/3in1;Supervision/safety   Toileting- Clothing Manipulation and Hygiene: Minimal assistance;Sitting/lateral lean;Sit to/from stand Toileting - Clothing Manipulation Details (indicate cue type and reason): Min A for peri care after using 3:1   Functional mobility during ADLs: Supervision/safety;Min guard;Cueing for sequencing;Rolling walker (2 wheels) General ADL Comments: Pt was  seen for OT ADL retraining session with focus on toileting, transfers  and bed mobility. Pt is overall Min guard-supervision for toileting transfers and Min A for hygeine. She is set up for grooming, Mod I for eating while seated at EOB. However, her O2 SATs fluctuated greatly from 84-94% on 9L O2 via HFNC. Discussed taking rest breaks PRN as well as pursed lip breathing techniques to bring O2 back up onto 90"s. Pt also stated that she has been nauseous this morning and her RN is aware, gave meds per pt report. She should benefit from continued acute OT followed by SNF Rehab to assist in maximizing independence with ADL's and self care tasks.    Extremity/Trunk Assessment Upper Extremity Assessment Upper Extremity Assessment: Overall WFL for tasks assessed   Lower Extremity Assessment Lower Extremity Assessment: Defer to PT evaluation   Cervical / Trunk Assessment Cervical / Trunk Assessment: Kyphotic    Vision Baseline Vision/History: 1 Wears glasses Ability to See in Adequate Light: 0 Adequate Patient Visual Report: No change from baseline Vision Assessment?: No apparent visual deficits          Cognition Arousal/Alertness: Awake/alert Behavior During Therapy: WFL for tasks assessed/performed Overall Cognitive Status: Within Functional Limits for tasks assessed     General Comments: Pt reported feeling very nauseous throughout session. She states that she was given medication prior to therapy session                   Pertinent Vitals/ Pain       Pain Assessment Pain Assessment: No/denies pain (Pt reports being nauseous)  Home Living  Please refer to initial OT assessment for details    Prior Functioning/Environment  Please refer to initial OT assessment for details     Frequency  Min 2X/week        Progress Toward Goals  OT Goals(current goals can now be found in the care plan section)  Progress towards OT goals: Progressing toward goals  Acute Rehab OT  Goals Patient Stated Goal: Go to rehab then home if able OT Goal Formulation: With patient Time For Goal Achievement: 11/27/22 Potential to Achieve Goals: Good  Plan Discharge plan remains appropriate;Frequency remains appropriate       AM-PAC OT "6 Clicks" Daily Activity     Outcome Measure   Help from another person eating meals?: None Help from another person taking care of personal grooming?: A Little Help from another person toileting, which includes using toliet, bedpan, or urinal?: A Lot Help from another person bathing (including washing, rinsing, drying)?: A Lot Help from another person to put on and taking off regular upper body clothing?: A Little Help from another person to put on and taking off regular lower body clothing?: A Little 6 Click Score: 17    End of Session Equipment Utilized During Treatment: Rolling walker (2 wheels);Oxygen  OT Visit Diagnosis: Unsteadiness on feet (R26.81);Other abnormalities of gait and mobility (R26.89);Muscle weakness (generalized) (M62.81)   Activity Tolerance Patient tolerated treatment well;Other (comment) (Treatment limited by O2 fluctuations ranging from 84-94% on 9L HFNC)   Patient Left in bed;with call bell/phone within reach   Nurse Communication Mobility status;Other (comment) (Pt requesting regular "Not diet ginger ale")        Time: [id number] OT Time Calculation (min): 24 min  Charges: OT General Charges $OT Visit: 1 Visit OT Treatments $Self Care/Home Management : 23-37 mins   Suann Klier Beth Dixon, OTR/L 11/19/2022, 11:44 AM

## 2022-11-20 ENCOUNTER — Ambulatory Visit: Payer: Medicare Other | Admitting: Nurse Practitioner

## 2022-11-20 DIAGNOSIS — N182 Chronic kidney disease, stage 2 (mild): Secondary | ICD-10-CM | POA: Diagnosis not present

## 2022-11-20 DIAGNOSIS — N179 Acute kidney failure, unspecified: Secondary | ICD-10-CM | POA: Diagnosis not present

## 2022-11-20 DIAGNOSIS — R0609 Other forms of dyspnea: Secondary | ICD-10-CM | POA: Diagnosis not present

## 2022-11-20 DIAGNOSIS — I480 Paroxysmal atrial fibrillation: Secondary | ICD-10-CM | POA: Diagnosis not present

## 2022-11-20 LAB — GLUCOSE, CAPILLARY
Glucose-Capillary: 184 mg/dL — ABNORMAL HIGH (ref 70–99)
Glucose-Capillary: 110 mg/dL — ABNORMAL HIGH (ref 70–99)
Glucose-Capillary: 162 mg/dL — ABNORMAL HIGH (ref 70–99)
Glucose-Capillary: 57 mg/dL — ABNORMAL LOW (ref 70–99)
Glucose-Capillary: 129 mg/dL — ABNORMAL HIGH (ref 70–99)
Glucose-Capillary: 127 mg/dL — ABNORMAL HIGH (ref 70–99)

## 2022-11-20 LAB — URINALYSIS, ROUTINE W REFLEX MICROSCOPIC
Bilirubin Urine: NEGATIVE
Glucose, UA: >=500 mg/dL — AB
Hgb urine dipstick: NEGATIVE
Ketones, ur: NEGATIVE mg/dL
Leukocytes,Ua: NEGATIVE
Nitrite: NEGATIVE
Protein, ur: NEGATIVE mg/dL
Specific Gravity, Urine: 1.014 (ref 1.005–1.030)
pH: 6 (ref 5.0–8.0)

## 2022-11-20 MED ORDER — FUROSEMIDE 10 MG/ML IJ SOLN
40.0000 mg | Freq: Once | INTRAMUSCULAR | Status: AC
  Administered 2022-11-20: 40 mg via INTRAVENOUS
  Filled 2022-11-20: qty 4

## 2022-11-20 MED ORDER — ONDANSETRON HCL 4 MG/2ML IJ SOLN
4.0000 mg | Freq: Four times a day (QID) | INTRAMUSCULAR | Status: DC | PRN
  Administered 2022-11-20 – 2022-11-23 (×3): 4 mg via INTRAVENOUS
  Filled 2022-11-20 (×3): qty 2

## 2022-11-20 MED ORDER — PROMETHAZINE HCL 12.5 MG PO TABS: 12.5000 mg | ORAL_TABLET | Freq: Four times a day (QID) | ORAL | Status: DC | PRN

## 2022-11-20 MED ORDER — SODIUM CHLORIDE 0.9 % IV SOLN
6.2500 mg | Freq: Four times a day (QID) | INTRAVENOUS | Status: DC | PRN
  Filled 2022-11-20 (×2): qty 0.25

## 2022-11-20 NOTE — Progress Notes (Signed)
[facility]                                                                               Katherine [name], is a 67 y.o. 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   Patient was hospitalized from 3/22-3/27 for NSTEMI / acute on chronic combined CHF.  Troponin trended from 27-428.  Cardiology consulted, pt not felt to be candidate for LHC due to vascular access issues and transplanted kidney.  TTE with LVEF of 35 to 40%, dyskinesis of mid to distal inferior septal wall and entire apex. Patient was diuresed with IV Lasix and cleared for discharge by cardiology on p.o. Lasix 40 mg twice daily.  Sherryll Burger was initially added but discontinued on discharge due to soft blood pressures.  Imdur was reduced to 30 mg daily as well.  Therapy recommended SNF but patient chose to go home with home health and DME.  Unfortunately, she didn't do well at all after EMS got to her house.  She became severely SOB just going up the front stairs and pt brought back to ED. Patient's dyspnea improved while in ED.  She had mild AKI.   AKI resolved.  Medically stable for discharge pending SNF.[name] Kitchen  Now willing to go to SNF. Currently waiting for insurance authorization.    Assessment & Plan    Assessment and Plan:  DOE/COPD/chronic hypoxic RF on 5-7 L at baseline: Pt more sob this am. CXR shows worsening congestion. Will give her an extra dose of IV lasix 40 mg and check urine output.  - plan to wean her oxygen down to 7 lit/min.  -Continue home inhalers    Chest pain/elevated troponin/CAD s/p CABG with multiple stenting: Felt to be demand ischemia -Previously evaluated by cardiology who  recommended medical mgt -Continue Plavix, ranolazine, Crestor, metoprolol and Imdur -Outpatient follow-up with cardiology   Chronic combined CHF: TTE as above.   Diuresed appropriately.  -Resume Lasix at 40 mg daily -GDMT limited by soft BP. -continue with strict intake and output and daily weights.    AKI on CKD-3A/Hx of renal transplant: AKI resolved. -Continue home Imuran and prednisone   Hypotension:  Resolved.    Uncontrolled IDDM-2 with hyperglycemia: A1c 8.5% in 05/2022. CBG (last 3)  Recent Labs    11/19/22 2149 11/20/22 0758 11/20/22 1153  GLUCAP 234* 184* 110*    Continue with SSI and semglee.    Paroxysmal A-fib Rate controlled.  -Metoprolol, Eliquis   Debility: Therapy recommended SNF but patient chose to go home with home health previous hospitalization.  Now willing to go to SNF -[facility] consulted.   Anxiety and depression: Poorly controlled. -Vraylar, Lamictal, Ativan, Zoloft   Chronic back pain, narcotic dependent: Narcotic database reviewed. -Oxycodone, Flexeril   Grief: Lost her brother recently. -Emotional support -Chaplain consult if interested.  Morbid Obesity:  Body mass index is 38.49 kg/m.   Pressure Skin Injury: POA Stage 2 sacral pressure injury.  Pressure Injury 11/06/22 Sacrum Mid;Lower Stage 2 -  Partial thickness loss of dermis presenting as a shallow open injury with a red, pink wound bed without slough. (Active)  11/06/22 2000  Location: Sacrum  Location Orientation: Mid;Lower  Staging: Stage 2 -  Partial thickness loss of dermis presenting as a shallow open injury with a red, pink wound bed without slough.  Wound Description (Comments):   Present on Admission: Yes    Wound care .    Left fore arm skin tear with foam dressing in place.  Will change dressing today.    Code Status: SNR/DNI Family Communication: None at bedside Level of care: Telemetry Cardiac Status is: Inpatient The patient will remain inpatient because:  Safe disposition/ SNF Placement , awaiting for insurance authorization.     Code Status: DNR.  DVT Prophylaxis:   apixaban (ELIQUIS) tablet 5 mg   Level of Care: Level of care: Telemetry Cardiac Family Communication: none   Disposition Plan:     Remains inpatient appropriate:  SNF placement.   Procedures:  None.   Consultants:   None.   Antimicrobials:   Anti-infectives (From admission, onward)    None        Medications  Scheduled Meds:  apixaban  5 mg Oral BID   arformoterol  15 mcg Nebulization BID   azaTHIOprine  125 mg Oral Daily   budesonide  2 mL Nebulization BID   clopidogrel  75 mg Oral Daily   empagliflozin  10 mg Oral Daily   fluticasone  2 spray Each Nare Daily   insulin aspart  0-9 Units Subcutaneous TID WC   insulin aspart  3 Units Subcutaneous TID WC   insulin glargine-yfgn  12 Units Subcutaneous Daily   isosorbide mononitrate  30 mg Oral Daily   lamoTRIgine  25 mg Oral QHS   metoprolol tartrate  50 mg Oral BID   montelukast  10 mg Oral QHS   multivitamin with minerals  1 tablet Oral Daily   pantoprazole  40 mg Oral Daily   predniSONE  5 mg Oral Q breakfast   ranolazine  1,000 mg Oral BID   rosuvastatin  20 mg Oral Daily   sertraline  200 mg Oral Daily   Continuous Infusions:  promethazine (PHENERGAN) injection (IM or IVPB) 200 mL/hr at 11/19/22 1539   PRN Meds:.acetaminophen **OR** acetaminophen, albuterol, bisacodyl, cyclobenzaprine, gabapentin, guaiFENesin-dextromethorphan, LORazepam, melatonin, nitroGLYCERIN, oxyCODONE, promethazine (PHENERGAN) injection (IM or IVPB)    Subjective:   Katherine [name] was seen and examined today.nausea has improved. No chest pain . Some sob earlier today.   Objective:   Vitals:   11/20/22 0108 11/20/22 0353 11/20/22 0914 11/20/22 0918  BP: (!) 141/89 121/71  108/65  Pulse: (!) 101 68 63 67  Resp:  20    Temp:  98.1 F (36.7 C) 98.1 F (36.7 C)   TempSrc:  Oral Oral   SpO2: 90% 90%    Weight:       Height:        Intake/Output Summary (Last 24 hours) at 11/20/2022 1343 Last data filed at 11/19/2022 1539 Gross per 24 hour  Intake 1.22 ml  Output --  Net 1.22 ml    Filed Weights   11/12/22 0811 11/12/22 0857  Weight: 80 kg 92.4 kg     Exam General exam: Appears calm and comfortable  Respiratory system: diminished  at bases. On 7 lit of Ong oxygen Cardiovascular system: S1 & S2 heard, RRR. No JVD, Gastrointestinal system: Abdomen is nondistended, soft and nontender.  Central nervous system: Alert and oriented. Grossly non focal.  Extremities:  no pedal edema.  Skin: No rashes,  Psychiatry: mood is appropriate.     Data Reviewed:  I have personally reviewed following labs and imaging studies   CBC Lab Results  Component Value Date   WBC 5.6 11/19/2022   RBC 3.52 (L) 11/19/2022   HGB 10.9 (L) 11/19/2022   HCT 34.2 (L) 11/19/2022   MCV 97.2 11/19/2022   MCH 31.0 11/19/2022   PLT 356 11/19/2022   MCHC 31.9 11/19/2022   RDW 18.9 (H) 11/19/2022   LYMPHSABS 0.7 11/19/2022   MONOABS 0.6 11/19/2022   EOSABS 0.1 11/19/2022   BASOSABS 0.0 11/19/2022     Last metabolic panel Lab Results  Component Value Date   NA 138 11/19/2022   K 3.8 11/19/2022   CL 97 (L) 11/19/2022   CO2 28 11/19/2022   BUN 8 11/19/2022   CREATININE 1.13 (H) 11/19/2022   GLUCOSE 153 (H) 11/19/2022   GFRNONAA 54 (L) 11/19/2022   GFRAA 58 (L) 04/12/2020   CALCIUM 9.1 11/19/2022   PHOS 4.5 11/17/2022   PROT 7.4 11/19/2022   ALBUMIN 2.9 (L) 11/19/2022   BILITOT 0.8 11/19/2022   ALKPHOS 98 11/19/2022   AST 21 11/19/2022   ALT 14 11/19/2022   ANIONGAP 13 11/19/2022    CBG (last 3)  Recent Labs    11/19/22 2149 11/20/22 0758 11/20/22 1153  GLUCAP 234* 184* 110*       Coagulation Profile: No results for input(s): "INR", "PROTIME" in the last 168 hours.   Radiology Studies: DG Chest 2 View  Result Date: 11/19/2022 CLINICAL DATA:  Dyspnea EXAM: CHEST - 2 VIEW COMPARISON:   11/11/2022 FINDINGS: Sternotomy and CABG. Stable cardiomegaly. Bilateral airspace and interstitial opacities. No definite pleural effusion. No pneumothorax. No acute osseous abnormality. IMPRESSION: Cardiomegaly and diffuse pulmonary edema, worsened from 11/11/2022. Electronically Signed   By: Minerva Fester M.D.   On: 11/19/2022 17:34       Kathlen Mody M.D. [facility] 11/20/2022, 1:43 PM  Available via Epic secure chat 7am-7pm After 7 pm, please refer to night coverage provider listed on amion.

## 2022-11-20 NOTE — Progress Notes (Signed)
Pt c/o chest pain 5/10, same as previously, dull, aching and pressure.  VS stable, no acute distress, mental status unchanged.  Nitro given, and pain has resolved after 5 minutes. Pt comfortable, resting in bed.

## 2022-11-20 NOTE — Progress Notes (Signed)
Physical Therapy Treatment Patient Details Name: Katherine StallRegina Lea Pineda MRN: [id number] DOB: 05/27/1956 Today's Date: 11/20/2022   History of Present Illness Pt is 67 year old [facility] for SOB. Pt had just been dc'd home from hospital and PTAR called EMS due to pt reporting chest pain and SOB on arrival home. PMH - ESRD s/p renal transplant on chronic immunosuppression, CAD, CABG, afib, chronic diastolic CHF, COPD on home O2, DM, HTN, depression/anxiety, chronic pain    PT Comments    Pt making steady progress with incr activity tolerance and amb distance. Patient will benefit from continued inpatient follow up therapy, <3 hours/day    Recommendations for follow up therapy are one component of a multi-disciplinary discharge planning process, led by the attending physician.  Recommendations may be updated based on patient status, additional functional criteria and insurance authorization.  Follow Up Recommendations  Can patient physically be transported by private vehicle: Yes    Assistance Recommended at Discharge Intermittent Supervision/Assistance  Patient can return home with the following A little help with walking and/or transfers;A little help with bathing/dressing/bathroom;Assist for transportation;Help with stairs or ramp for entrance   Equipment Recommendations  None recommended by PT    Recommendations for Other Services       Precautions / Restrictions Precautions Precautions: Fall;Other (comment) Precaution Comments: watch O2 Restrictions Weight Bearing Restrictions: No     Mobility  Bed Mobility Overal bed mobility: Needs Assistance Bed Mobility: Supine to Sit, Sit to Supine     Supine to sit: Supervision, HOB elevated Sit to supine: Supervision        Transfers Overall transfer level: Needs assistance Equipment used: Rolling walker (2 wheels) Transfers: Sit to/from Stand Sit to Stand: Min guard           General transfer comment:  Assist for safety    Ambulation/Gait Ambulation/Gait assistance: Min guard Gait Distance (Feet): 50 Feet (x 3) Assistive device: Rollator (4 wheels) Gait Pattern/deviations: Step-through pattern, Decreased step length - right, Decreased step length - left, Decreased stride length Gait velocity: decreased Gait velocity interpretation: <1.31 ft/sec, indicative of household ambulator   General Gait Details: Assist for safety. 2 seated rest breaks on rollator   Stairs             Wheelchair Mobility    Modified Rankin (Stroke Patients Only)       Balance Overall balance assessment: Needs assistance Sitting-balance support: Feet supported, No upper extremity supported Sitting balance-Leahy Scale: Good     Standing balance support: Reliant on assistive device for balance, Bilateral upper extremity supported Standing balance-Leahy Scale: Poor Standing balance comment: rollator and min guard for static standing                            Cognition Arousal/Alertness: Awake/alert Behavior During Therapy: WFL for tasks assessed/performed Overall Cognitive Status: Within Functional Limits for tasks assessed                                          Exercises      General Comments General comments (skin integrity, edema, etc.): SpO2 88% on 8L O2 with amb      Pertinent Vitals/Pain Pain Assessment Pain Assessment: No/denies pain    Home Living  Prior Function            PT Goals (current goals can now be found in the care plan section) Progress towards PT goals: Progressing toward goals    Frequency    Min 1X/week      PT Plan Current plan remains appropriate    Co-evaluation              AM-PAC PT "6 Clicks" Mobility   Outcome Measure  Help needed turning from your back to your side while in a flat bed without using bedrails?: None Help needed moving from lying on your back to  sitting on the side of a flat bed without using bedrails?: A Little Help needed moving to and from a bed to a chair (including a wheelchair)?: A Little Help needed standing up from a chair using your arms (e.g., wheelchair or bedside chair)?: A Little Help needed to walk in hospital room?: A Little Help needed climbing 3-5 steps with a railing? : Total 6 Click Score: 17    End of Session Equipment Utilized During Treatment: Gait belt;Oxygen Activity Tolerance: Patient limited by fatigue Patient left: with call bell/phone within reach;in bed;with bed alarm set   PT Visit Diagnosis: Other abnormalities of gait and mobility (R26.89);Muscle weakness (generalized) (M62.81);History of falling (Z91.81)     Time: 3557-3220 PT Time Calculation (min) (ACUTE ONLY): 31 min  Charges:  $Gait Training: 23-37 mins                     [facility] PT Acute Rehabilitation Services Office [phone]    Angelina Ok [facility] 11/20/2022, 12:26 PM

## 2022-11-20 NOTE — TOC Progression Note (Signed)
Transition of Care [facility]) - Progression Note    Patient Details  Name: Katherine [name] MRN: [id number] Date of Birth: Jul 26, 1956  Transition of Care [facility]) CM/SW Contact  Delilah Shan, LCSWA Phone Number: 11/20/2022, 4:47 PM  Clinical Narrative:     CSW spoke with Whitney with [employer] who informed CSW that patients insurance authorization is still pending. CSW will continue to follow and assist with patients dc planning needs.  Expected Discharge Plan: Skilled Nursing Facility Barriers to Discharge: Insurance Authorization  Expected Discharge Plan and Services                                               Social Determinants of Health (SDOH) Interventions SDOH Screenings   Food Insecurity: No Food Insecurity (11/12/2022)  Housing: Low Risk  (11/12/2022)  Transportation Needs: Unmet Transportation Needs (11/12/2022)  Utilities: At Risk (11/12/2022)  Alcohol Screen: Low Risk  (12/10/2021)  Depression (PHQ2-9): Medium Risk (02/02/2022)  Tobacco Use: Medium Risk (11/12/2022)    Readmission Risk Interventions    11/09/2022    2:22 PM 12/12/2021   10:01 AM 09/23/2021    4:39 PM  Readmission Risk Prevention Plan  Transportation Screening Complete Complete Complete  Medication Review (RN Care Manager) Referral to Pharmacy Complete Complete  PCP or Specialist appointment within 3-5 days of discharge  Complete Complete  HRI or Home Care Consult Complete Complete Complete  SW Recovery Care/Counseling Consult Complete Complete Complete  Palliative Care Screening Not Applicable Not Applicable Complete  Skilled Nursing Facility Patient Refused Not Applicable Not Applicable

## 2022-11-20 NOTE — Consult Note (Addendum)
WOC Nurse Consult Note: see WOC note from 11/13/2022 Reason for Consult: L forearm wound  Wound type: ?traumatic  Pressure Injury POA: N/A Measurement: see nursing flowsheet  Wound YKZ:LDJTTSV hyperkeratotic per bedside nurse Drainage (amount, consistency, odor) none  Periwound: intact  Dressing procedure/placement/frequency: Clean L forearm wound with NS, apply a single layer Xeroform gauze M-W-F [place] [id number]) as per previous order.    Discussed POC with bedside nurse.  WOC team will not follow at this time.  Re-consult if further needs arise.   Thank you,    Priscella Mann MSN, RN-BC, [employer] [phone]

## 2022-11-21 LAB — BASIC METABOLIC PANEL
Anion gap: 10 (ref 5–15)
BUN: 13 mg/dL (ref 8–23)
CO2: 27 mmol/L (ref 22–32)
Calcium: 8.8 mg/dL — ABNORMAL LOW (ref 8.9–10.3)
Chloride: 98 mmol/L (ref 98–111)
Creatinine, Ser: 1.04 mg/dL — ABNORMAL HIGH (ref 0.44–1.00)
GFR, Estimated: 60 mL/min — ABNORMAL LOW (ref >60)
Glucose, Bld: 200 mg/dL — ABNORMAL HIGH (ref 70–99)
Potassium: 3.6 mmol/L (ref 3.5–5.1)
Sodium: 135 mmol/L (ref 135–145)

## 2022-11-21 LAB — GLUCOSE, CAPILLARY
Glucose-Capillary: 121 mg/dL — ABNORMAL HIGH (ref 70–99)
Glucose-Capillary: 165 mg/dL — ABNORMAL HIGH (ref 70–99)
Glucose-Capillary: 252 mg/dL — ABNORMAL HIGH (ref 70–99)
Glucose-Capillary: 156 mg/dL — ABNORMAL HIGH (ref 70–99)

## 2022-11-21 MED ORDER — FUROSEMIDE 10 MG/ML IJ SOLN
40.0000 mg | Freq: Once | INTRAMUSCULAR | Status: AC
  Administered 2022-11-21: 40 mg via INTRAVENOUS
  Filled 2022-11-21: qty 4

## 2022-11-21 NOTE — Progress Notes (Signed)
[facility]                                                                               Katherine [name], is a 67 y.o. 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   Patient was hospitalized from 3/22-3/27 for NSTEMI / acute on chronic combined CHF.  Troponin trended from 27-428.  Cardiology consulted, pt not felt to be candidate for LHC due to vascular access issues and transplanted kidney.  TTE with LVEF of 35 to 40%, dyskinesis of mid to distal inferior septal wall and entire apex. Patient was diuresed with IV Lasix and cleared for discharge by cardiology on p.o. Lasix 40 mg twice daily.  Sherryll Burger was initially added but discontinued on discharge due to soft blood pressures.  Imdur was reduced to 30 mg daily as well.  Therapy recommended SNF but patient chose to go home with home health and DME.  Unfortunately, she didn't do well at all after EMS got to her house.  She became severely SOB just going up the front stairs and pt brought back to ED. Patient's dyspnea improved while in ED.  She had mild AKI.   AKI resolved.  Medically stable for discharge pending SNF.[name] Kitchen  Now willing to go to SNF. Currently waiting for insurance authorization.    Assessment & Plan    Assessment and Plan:  DOE/COPD/chronic hypoxic RF on 5-7 L at baseline: Pt more sob this am. CXR shows worsening congestion. Extra dose of IV lasix given.  - plan to wean her oxygen down to 7 lit/min.  -Continue home inhalers    Chest pain/elevated troponin/CAD s/p CABG with multiple stenting: Felt to be demand ischemia -Previously evaluated by cardiology who recommended medical mgt -Continue Plavix,  ranolazine, Crestor, metoprolol and Imdur -Outpatient follow-up with cardiology   Chronic combined CHF: TTE as above.   Diuresed appropriately.  -Resume Lasix at 40 mg daily -GDMT limited by soft BP. -continue with strict intake and output and daily weights.    AKI on CKD-3A/Hx of renal transplant: AKI resolved. -Continue home Imuran and prednisone   Hypotension:  Resolved.    Uncontrolled IDDM-2 with hyperglycemia: A1c 8.5% in 05/2022. CBG (last 3)  Recent Labs    11/21/22 0750 11/21/22 1203 11/21/22 1555  GLUCAP 121* 165* 252*    Continue with SSI and semglee.    Paroxysmal A-fib Rate controlled.  -Metoprolol, Eliquis   Debility: Therapy recommended SNF but patient chose to go home with home health previous hospitalization.  Now willing to go to SNF -[facility] consulted.   Anxiety and depression: Poorly controlled. -Vraylar, Lamictal, Ativan, Zoloft   Chronic back pain, narcotic dependent: Narcotic database reviewed. -Oxycodone, Flexeril   Grief: Lost her brother recently. -Emotional support -Chaplain consult if interested.   Morbid Obesity:  Body mass index is  38.49 kg/m.   Pressure Skin Injury: POA Stage 2 sacral pressure injury.  Pressure Injury 11/06/22 Sacrum Mid;Lower Stage 2 -  Partial thickness loss of dermis presenting as a shallow open injury with a red, pink wound bed without slough. (Active)  11/06/22 2000  Location: Sacrum  Location Orientation: Mid;Lower  Staging: Stage 2 -  Partial thickness loss of dermis presenting as a shallow open injury with a red, pink wound bed without slough.  Wound Description (Comments):   Present on Admission: Yes  Wound care.    Left fore arm skin tear / skin wound with foam dressing in place.  Will need outpatient follow up with a dermatologist / surgery for excisional biopsy.    Code Status: SNR/DNI Family Communication: None at bedside Level of care: Telemetry Cardiac Status is: Inpatient The patient will  remain inpatient because: Safe disposition/ SNF Placement , awaiting for insurance authorization.     Code Status: DNR.  DVT Prophylaxis:   apixaban (ELIQUIS) tablet 5 mg   Level of Care: Level of care: Telemetry Cardiac Family Communication: none   Disposition Plan:     Remains inpatient appropriate:  SNF placement.   Procedures:  None.   Consultants:   None.   Antimicrobials:   Anti-infectives (From admission, onward)    None        Medications  Scheduled Meds:  apixaban  5 mg Oral BID   arformoterol  15 mcg Nebulization BID   azaTHIOprine  125 mg Oral Daily   budesonide  2 mL Nebulization BID   clopidogrel  75 mg Oral Daily   empagliflozin  10 mg Oral Daily   fluticasone  2 spray Each Nare Daily   insulin aspart  0-9 Units Subcutaneous TID WC   insulin aspart  3 Units Subcutaneous TID WC   insulin glargine-yfgn  12 Units Subcutaneous Daily   isosorbide mononitrate  30 mg Oral Daily   lamoTRIgine  25 mg Oral QHS   metoprolol tartrate  50 mg Oral BID   montelukast  10 mg Oral QHS   multivitamin with minerals  1 tablet Oral Daily   pantoprazole  40 mg Oral Daily   predniSONE  5 mg Oral Q breakfast   ranolazine  1,000 mg Oral BID   rosuvastatin  20 mg Oral Daily   sertraline  200 mg Oral Daily   Continuous Infusions:  PRN Meds:.acetaminophen **OR** acetaminophen, albuterol, bisacodyl, cyclobenzaprine, gabapentin, guaiFENesin-dextromethorphan, LORazepam, melatonin, nitroGLYCERIN, ondansetron (ZOFRAN) IV, oxyCODONE, promethazine    Subjective:   Katherine [name] was seen and examined today. In good spirits.  No new complaints.  Objective:   Vitals:   11/21/22 1300 11/21/22 1545 11/21/22 1600 11/21/22 1615  BP:  109/63    Pulse: 65 72 75 73  Resp:  (!) 22    Temp:  98.2 F (36.8 C)    TempSrc:  Oral    SpO2: 100% 90% (!) 89% 92%  Weight:      Height:        Intake/Output Summary (Last 24 hours) at 11/21/2022 1848 Last data filed at 11/21/2022  0820 Gross per 24 hour  Intake 240 ml  Output --  Net 240 ml    Filed Weights   11/12/22 0811 11/12/22 0857  Weight: 80 kg 92.4 kg     Exam General exam: Appears calm and comfortable  Respiratory system: diminished at bases.  Cardiovascular system: S1 & S2 heard, RRR. No JVD,  Gastrointestinal system: Abdomen is nondistended, soft and  nontender.  Central nervous system: Alert and oriented. No focal neurological deficits. Extremities: Symmetric 5 x 5 power. Skin: No rashes, lesions or ulcers Psychiatry: Mood & affect appropriate.      Data Reviewed:  I have personally reviewed following labs and imaging studies   CBC Lab Results  Component Value Date   WBC 5.6 11/19/2022   RBC 3.52 (L) 11/19/2022   HGB 10.9 (L) 11/19/2022   HCT 34.2 (L) 11/19/2022   MCV 97.2 11/19/2022   MCH 31.0 11/19/2022   PLT 356 11/19/2022   MCHC 31.9 11/19/2022   RDW 18.9 (H) 11/19/2022   LYMPHSABS 0.7 11/19/2022   MONOABS 0.6 11/19/2022   EOSABS 0.1 11/19/2022   BASOSABS 0.0 11/19/2022     Last metabolic panel Lab Results  Component Value Date   NA 135 11/21/2022   K 3.6 11/21/2022   CL 98 11/21/2022   CO2 27 11/21/2022   BUN 13 11/21/2022   CREATININE 1.04 (H) 11/21/2022   GLUCOSE 200 (H) 11/21/2022   GFRNONAA 60 (L) 11/21/2022   GFRAA 58 (L) 04/12/2020   CALCIUM 8.8 (L) 11/21/2022   PHOS 4.5 11/17/2022   PROT 7.4 11/19/2022   ALBUMIN 2.9 (L) 11/19/2022   BILITOT 0.8 11/19/2022   ALKPHOS 98 11/19/2022   AST 21 11/19/2022   ALT 14 11/19/2022   ANIONGAP 10 11/21/2022    CBG (last 3)  Recent Labs    11/21/22 0750 11/21/22 1203 11/21/22 1555  GLUCAP 121* 165* 252*       Coagulation Profile: No results for input(s): "INR", "PROTIME" in the last 168 hours.   Radiology Studies: No results found.     Kathlen ModyVijaya Jaena Brocato M.D. [facility] 11/21/2022, 6:48 PM  Available via Epic secure chat 7am-7pm After 7 pm, please refer to night coverage provider listed on  amion.

## 2022-11-21 NOTE — Progress Notes (Signed)
CSW spoke with Alphonzo Lemmings at [employer] who states patient's insurance authorization is still pending.  Edwin Dada, MSW, LCSW Transitions of Care  Clinical Social Worker II [phone]

## 2022-11-21 NOTE — Progress Notes (Signed)
Mobility Specialist Progress Note:   11/21/22 1215  Mobility  Activity Ambulated with assistance in hallway  Level of Assistance Standby assist, set-up cues, supervision of patient - no hands on  Assistive Device Four wheel walker  Distance Ambulated (ft) 125 ft  Activity Response Tolerated fair  Mobility Referral Yes  $Mobility charge 1 Mobility   Pt eager for mobility session. Required no physical assist, x1 seated rest break during ambulation. 10LO2 required to maintain SpO2 WFL. Pt left sitting EOB with all needs met.   Addison Lank Mobility Specialist Please contact via SecureChat or  Rehab office at [phone]

## 2022-11-22 LAB — GLUCOSE, CAPILLARY
Glucose-Capillary: 134 mg/dL — ABNORMAL HIGH (ref 70–99)
Glucose-Capillary: 151 mg/dL — ABNORMAL HIGH (ref 70–99)
Glucose-Capillary: 65 mg/dL — ABNORMAL LOW (ref 70–99)
Glucose-Capillary: 53 mg/dL — ABNORMAL LOW (ref 70–99)
Glucose-Capillary: 72 mg/dL (ref 70–99)
Glucose-Capillary: 82 mg/dL (ref 70–99)
Glucose-Capillary: 238 mg/dL — ABNORMAL HIGH (ref 70–99)

## 2022-11-22 LAB — BASIC METABOLIC PANEL
Anion gap: 10 (ref 5–15)
BUN: 14 mg/dL (ref 8–23)
CO2: 27 mmol/L (ref 22–32)
Calcium: 8.5 mg/dL — ABNORMAL LOW (ref 8.9–10.3)
Chloride: 99 mmol/L (ref 98–111)
Creatinine, Ser: 1.01 mg/dL — ABNORMAL HIGH (ref 0.44–1.00)
GFR, Estimated: >60 mL/min (ref >60)
Glucose, Bld: 203 mg/dL — ABNORMAL HIGH (ref 70–99)
Potassium: 3.5 mmol/L (ref 3.5–5.1)
Sodium: 136 mmol/L (ref 135–145)

## 2022-11-22 MED ORDER — FLEET ENEMA 7-19 GM/118ML RE ENEM
1.0000 | ENEMA | Freq: Every day | RECTAL | Status: DC | PRN
  Administered 2022-11-22: 1 via RECTAL
  Filled 2022-11-22: qty 1

## 2022-11-22 MED ORDER — HYDROCORTISONE ACETATE 25 MG RE SUPP
25.0000 mg | Freq: Two times a day (BID) | RECTAL | Status: DC
  Administered 2022-11-22 – 2022-11-23 (×3): 25 mg via RECTAL
  Filled 2022-11-22 (×4): qty 1

## 2022-11-22 MED ORDER — INSULIN GLARGINE-YFGN 100 UNIT/ML ~~LOC~~ SOLN
8.0000 [IU] | Freq: Every day | SUBCUTANEOUS | Status: DC
  Administered 2022-11-23: 8 [IU] via SUBCUTANEOUS
  Filled 2022-11-22: qty 0.08

## 2022-11-22 MED ORDER — WITCH HAZEL-GLYCERIN EX PADS: MEDICATED_PAD | CUTANEOUS | Status: DC | PRN

## 2022-11-22 NOTE — Progress Notes (Signed)
[facility]                                                                               Katherine [name], is a 67 y.o. 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   Patient was hospitalized from 3/22-3/27 for NSTEMI / acute on chronic combined CHF.  Troponin trended from 27-428.  Cardiology consulted, pt not felt to be candidate for LHC due to vascular access issues and transplanted kidney.  TTE with LVEF of 35 to 40%, dyskinesis of mid to distal inferior septal wall and entire apex. Patient was diuresed with IV Lasix and cleared for discharge by cardiology on p.o. Lasix 40 mg twice daily.  Sherryll Burger was initially added but discontinued on discharge due to soft blood pressures.  Imdur was reduced to 30 mg daily as well.  Therapy recommended SNF but patient chose to go home with home health and DME.  Unfortunately, she didn't do well at all after EMS got to her house.  She became severely SOB just going up the front stairs and pt brought back to ED. Patient's dyspnea improved while in ED.  She had mild AKI.   AKI resolved.  Medically stable for discharge pending SNF.[name] Kitchen  Now willing to go to SNF. Currently waiting for insurance authorization.    Assessment & Plan    Assessment and Plan:  DOE/COPD/chronic hypoxic RF on 5-7 L at baseline: Pt more sob this am. CXR shows worsening congestion. Extra dose of IV lasix given.  - she is on 6 lit of [place] oxygen with sats in 100%.  -Continue home inhalers    Chest pain/elevated troponin/CAD s/p CABG with multiple stenting: Felt to be demand ischemia -Previously evaluated by cardiology who recommended medical mgt -Continue  Plavix, ranolazine, Crestor, metoprolol and Imdur -Outpatient follow-up with cardiology   Chronic combined CHF: TTE as above.   Diuresed appropriately.  -Resume Lasix at 40 mg daily -GDMT limited by soft BP. -continue with strict intake and output and daily weights.    AKI on CKD-3A/Hx of renal transplant: AKI resolved. -Continue home Imuran and prednisone - creatinine is 1.01.    Hypotension:  Resolved.    Uncontrolled IDDM-2 with hyperglycemia: A1c 8.5% in 05/2022. CBG (last 3)  Recent Labs    11/22/22 1646 11/22/22 1654 11/22/22 1739  GLUCAP 53* 72 82   Decreased the semglee to 8 units and stopped the pre meal coverage.  Its from inadequate food intake.    Paroxysmal A-fib Rate controlled.  -Metoprolol, Eliquis   Debility: Therapy recommended SNF but patient chose to go home with home health previous hospitalization.  Now willing to go to SNF -[facility] consulted.   Anxiety and depression: Poorly controlled. -Vraylar, Lamictal, Ativan, Zoloft   Chronic back pain, narcotic dependent: Narcotic database reviewed. -Oxycodone, Flexeril  Grief: Lost her brother recently. -Emotional support -Chaplain consult if interested.   Morbid Obesity:  Body mass index is 38.49 kg/m.   Pressure Skin Injury: POA Stage 2 sacral pressure injury.  Pressure Injury 11/06/22 Sacrum Mid;Lower Stage 2 -  Partial thickness loss of dermis presenting as a shallow open injury with a red, pink wound bed without slough. (Active)  11/06/22 2000  Location: Sacrum  Location Orientation: Mid;Lower  Staging: Stage 2 -  Partial thickness loss of dermis presenting as a shallow open injury with a red, pink wound bed without slough.  Wound Description (Comments):   Present on Admission: Yes  Wound care.    Left fore arm skin tear / skin wound with foam dressing in place.  Will need outpatient follow up with a dermatologist / surgery for excisional biopsy.    Code Status: SNR/DNI Family  Communication: None at bedside Level of care: Telemetry Cardiac Status is: Inpatient The patient will remain inpatient because: Safe disposition/ SNF Placement , awaiting for insurance authorization.     Code Status: DNR.  DVT Prophylaxis:   apixaban (ELIQUIS) tablet 5 mg   Level of Care: Level of care: Telemetry Cardiac Family Communication: none   Disposition Plan:     Remains inpatient appropriate:  SNF placement.   Procedures:  None.   Consultants:   None.   Antimicrobials:   Anti-infectives (From admission, onward)    None        Medications  Scheduled Meds:  apixaban  5 mg Oral BID   arformoterol  15 mcg Nebulization BID   azaTHIOprine  125 mg Oral Daily   budesonide  2 mL Nebulization BID   clopidogrel  75 mg Oral Daily   empagliflozin  10 mg Oral Daily   fluticasone  2 spray Each Nare Daily   hydrocortisone  25 mg Rectal BID   insulin aspart  0-9 Units Subcutaneous TID WC   insulin aspart  3 Units Subcutaneous TID WC   insulin glargine-yfgn  12 Units Subcutaneous Daily   isosorbide mononitrate  30 mg Oral Daily   lamoTRIgine  25 mg Oral QHS   metoprolol tartrate  50 mg Oral BID   montelukast  10 mg Oral QHS   multivitamin with minerals  1 tablet Oral Daily   pantoprazole  40 mg Oral Daily   predniSONE  5 mg Oral Q breakfast   ranolazine  1,000 mg Oral BID   rosuvastatin  20 mg Oral Daily   sertraline  200 mg Oral Daily   Continuous Infusions:  PRN Meds:.acetaminophen **OR** acetaminophen, albuterol, bisacodyl, cyclobenzaprine, gabapentin, guaiFENesin-dextromethorphan, LORazepam, melatonin, nitroGLYCERIN, ondansetron (ZOFRAN) IV, oxyCODONE, promethazine, sodium phosphate, witch hazel-glycerin    Subjective:   Katherine [name] was seen and examined today. No new complaints .  Objective:   Vitals:   11/22/22 1246 11/22/22 1624 11/22/22 1700 11/22/22 1745  BP: (!) 89/54 (!) 108/59    Pulse: 67 73 78 80  Resp: 20 (!) 22    Temp: 98.5 F (36.9  C) 98.9 F (37.2 C)    TempSrc: Oral Oral    SpO2:  96% 91% 92%  Weight:      Height:        Intake/Output Summary (Last 24 hours) at 11/22/2022 1828 Last data filed at 11/22/2022 1800 Gross per 24 hour  Intake 600 ml  Output 400 ml  Net 200 ml    Filed Weights   11/12/22 0811 11/12/22 0857  Weight: 80 kg 92.4 kg  Exam General exam: Appears calm and comfortable  Respiratory system: Clear to auscultation. Respiratory effort normal. Cardiovascular system: S1 & S2 heard, RRR. No JVD, murmurs,  Gastrointestinal system: Abdomen is nondistended, soft and nontender.  Central nervous system: Alert and oriented. No focal neurological deficits. Extremities: Symmetric 5 x 5 power. Skin: No rashes, Psychiatry:  Mood & affect appropriate.       Data Reviewed:  I have personally reviewed following labs and imaging studies   CBC Lab Results  Component Value Date   WBC 5.6 11/19/2022   RBC 3.52 (L) 11/19/2022   HGB 10.9 (L) 11/19/2022   HCT 34.2 (L) 11/19/2022   MCV 97.2 11/19/2022   MCH 31.0 11/19/2022   PLT 356 11/19/2022   MCHC 31.9 11/19/2022   RDW 18.9 (H) 11/19/2022   LYMPHSABS 0.7 11/19/2022   MONOABS 0.6 11/19/2022   EOSABS 0.1 11/19/2022   BASOSABS 0.0 11/19/2022     Last metabolic panel Lab Results  Component Value Date   NA 136 11/22/2022   K 3.5 11/22/2022   CL 99 11/22/2022   CO2 27 11/22/2022   BUN 14 11/22/2022   CREATININE 1.01 (H) 11/22/2022   GLUCOSE 203 (H) 11/22/2022   GFRNONAA >60 11/22/2022   GFRAA 58 (L) 04/12/2020   CALCIUM 8.5 (L) 11/22/2022   PHOS 4.5 11/17/2022   PROT 7.4 11/19/2022   ALBUMIN 2.9 (L) 11/19/2022   BILITOT 0.8 11/19/2022   ALKPHOS 98 11/19/2022   AST 21 11/19/2022   ALT 14 11/19/2022   ANIONGAP 10 11/22/2022    CBG (last 3)  Recent Labs    11/22/22 1646 11/22/22 1654 11/22/22 1739  GLUCAP 53* 72 82       Coagulation Profile: No results for input(s): "INR", "PROTIME" in the last 168  hours.   Radiology Studies: No results found.     Kathlen ModyVijaya Gilmore List M.D. [facility] 11/22/2022, 6:28 PM  Available via Epic secure chat 7am-7pm After 7 pm, please refer to night coverage provider listed on amion.

## 2022-11-22 NOTE — TOC Progression Note (Addendum)
Transition of Care [facility]) - Progression Note    Patient Details  Name: Katherine [name] MRN: [id number] Date of Birth: 1955-12-21  Transition of Care [facility]) CM/SW Contact  Delilah Shan, LCSWA Phone Number: 11/22/2022, 9:37 AM  Clinical Narrative:     Update-11:55am- CSW received call back from [facility] who informed CSW that insurance authorization is still pending. CSW will continue to follow and assist with patients dc planning needs.  CSW LVM with Whitney with [facility]. CSW awaiting call back to check insurance authorization for patient. CSW will continue to follow and assist with patients dc planning needs.  Expected Discharge Plan: Skilled Nursing Facility Barriers to Discharge: Insurance Authorization  Expected Discharge Plan and Services                                               Social Determinants of Health (SDOH) Interventions SDOH Screenings   Food Insecurity: No Food Insecurity (11/12/2022)  Housing: Low Risk  (11/12/2022)  Transportation Needs: Unmet Transportation Needs (11/12/2022)  Utilities: At Risk (11/12/2022)  Alcohol Screen: Low Risk  (12/10/2021)  Depression (PHQ2-9): Medium Risk (02/02/2022)  Tobacco Use: Medium Risk (11/12/2022)    Readmission Risk Interventions    11/09/2022    2:22 PM 12/12/2021   10:01 AM 09/23/2021    4:39 PM  Readmission Risk Prevention Plan  Transportation Screening Complete Complete Complete  Medication Review (RN Care Manager) Referral to Pharmacy Complete Complete  PCP or Specialist appointment within 3-5 days of discharge  Complete Complete  HRI or Home Care Consult Complete Complete Complete  SW Recovery Care/Counseling Consult Complete Complete Complete  Palliative Care Screening Not Applicable Not Applicable Complete  Skilled Nursing Facility Patient Refused Not Applicable Not Applicable

## 2022-11-23 ENCOUNTER — Other Ambulatory Visit: Payer: Self-pay

## 2022-11-23 ENCOUNTER — Inpatient Hospital Stay (HOSPITAL_COMMUNITY)
Admission: EM | Admit: 2022-11-23 | Discharge: 2022-11-26 | Disposition: A | Discharge status: Home Health | Payer: Medicare Other | Source: Skilled Nursing Facility | Attending: Family Medicine | Admitting: Family Medicine

## 2022-11-23 ENCOUNTER — Encounter (HOSPITAL_COMMUNITY): Payer: Self-pay

## 2022-11-23 ENCOUNTER — Emergency Department (HOSPITAL_COMMUNITY): Payer: Medicare Other

## 2022-11-23 DIAGNOSIS — I25810 Atherosclerosis of coronary artery bypass graft(s) without angina pectoris: Secondary | ICD-10-CM | POA: Diagnosis present

## 2022-11-23 DIAGNOSIS — Z91048 Other nonmedicinal substance allergy status: Secondary | ICD-10-CM

## 2022-11-23 DIAGNOSIS — H547 Unspecified visual loss: Secondary | ICD-10-CM | POA: Diagnosis present

## 2022-11-23 DIAGNOSIS — I5043 Acute on chronic combined systolic (congestive) and diastolic (congestive) heart failure: Secondary | ICD-10-CM | POA: Diagnosis present

## 2022-11-23 DIAGNOSIS — S41119A Laceration without foreign body of unspecified upper arm, initial encounter: Secondary | ICD-10-CM | POA: Diagnosis present

## 2022-11-23 DIAGNOSIS — Z87891 Personal history of nicotine dependence: Secondary | ICD-10-CM

## 2022-11-23 DIAGNOSIS — L89152 Pressure ulcer of sacral region, stage 2: Secondary | ICD-10-CM | POA: Diagnosis present

## 2022-11-23 DIAGNOSIS — I252 Old myocardial infarction: Secondary | ICD-10-CM

## 2022-11-23 DIAGNOSIS — I447 Left bundle-branch block, unspecified: Secondary | ICD-10-CM | POA: Diagnosis present

## 2022-11-23 DIAGNOSIS — Z7951 Long term (current) use of inhaled steroids: Secondary | ICD-10-CM

## 2022-11-23 DIAGNOSIS — J449 Chronic obstructive pulmonary disease, unspecified: Secondary | ICD-10-CM | POA: Diagnosis present

## 2022-11-23 DIAGNOSIS — Z8616 Personal history of COVID-19: Secondary | ICD-10-CM

## 2022-11-23 DIAGNOSIS — Z7902 Long term (current) use of antithrombotics/antiplatelets: Secondary | ICD-10-CM

## 2022-11-23 DIAGNOSIS — Z87442 Personal history of urinary calculi: Secondary | ICD-10-CM

## 2022-11-23 DIAGNOSIS — Z888 Allergy status to other drugs, medicaments and biological substances status: Secondary | ICD-10-CM

## 2022-11-23 DIAGNOSIS — E785 Hyperlipidemia, unspecified: Secondary | ICD-10-CM | POA: Diagnosis present

## 2022-11-23 DIAGNOSIS — J9611 Chronic respiratory failure with hypoxia: Secondary | ICD-10-CM | POA: Diagnosis present

## 2022-11-23 DIAGNOSIS — Z7901 Long term (current) use of anticoagulants: Secondary | ICD-10-CM

## 2022-11-23 DIAGNOSIS — Z8249 Family history of ischemic heart disease and other diseases of the circulatory system: Secondary | ICD-10-CM

## 2022-11-23 DIAGNOSIS — Z79899 Other long term (current) drug therapy: Secondary | ICD-10-CM

## 2022-11-23 DIAGNOSIS — Z7984 Long term (current) use of oral hypoglycemic drugs: Secondary | ICD-10-CM

## 2022-11-23 DIAGNOSIS — R0602 Shortness of breath: Principal | ICD-10-CM

## 2022-11-23 DIAGNOSIS — Z955 Presence of coronary angioplasty implant and graft: Secondary | ICD-10-CM

## 2022-11-23 DIAGNOSIS — Z882 Allergy status to sulfonamides status: Secondary | ICD-10-CM

## 2022-11-23 DIAGNOSIS — M797 Fibromyalgia: Secondary | ICD-10-CM | POA: Diagnosis present

## 2022-11-23 DIAGNOSIS — Z8614 Personal history of Methicillin resistant Staphylococcus aureus infection: Secondary | ICD-10-CM

## 2022-11-23 DIAGNOSIS — I480 Paroxysmal atrial fibrillation: Secondary | ICD-10-CM | POA: Diagnosis present

## 2022-11-23 DIAGNOSIS — Z9851 Tubal ligation status: Secondary | ICD-10-CM

## 2022-11-23 DIAGNOSIS — Z8673 Personal history of transient ischemic attack (TIA), and cerebral infarction without residual deficits: Secondary | ICD-10-CM

## 2022-11-23 DIAGNOSIS — N1831 Chronic kidney disease, stage 3a: Secondary | ICD-10-CM | POA: Diagnosis present

## 2022-11-23 DIAGNOSIS — Z6832 Body mass index (BMI) 32.0-32.9, adult: Secondary | ICD-10-CM

## 2022-11-23 DIAGNOSIS — Z881 Allergy status to other antibiotic agents status: Secondary | ICD-10-CM

## 2022-11-23 DIAGNOSIS — I130 Hypertensive heart and chronic kidney disease with heart failure and stage 1 through stage 4 chronic kidney disease, or unspecified chronic kidney disease: Secondary | ICD-10-CM | POA: Diagnosis present

## 2022-11-23 DIAGNOSIS — I10 Essential (primary) hypertension: Secondary | ICD-10-CM | POA: Diagnosis present

## 2022-11-23 DIAGNOSIS — I2510 Atherosclerotic heart disease of native coronary artery without angina pectoris: Secondary | ICD-10-CM

## 2022-11-23 DIAGNOSIS — E1122 Type 2 diabetes mellitus with diabetic chronic kidney disease: Secondary | ICD-10-CM | POA: Diagnosis present

## 2022-11-23 DIAGNOSIS — I739 Peripheral vascular disease, unspecified: Secondary | ICD-10-CM | POA: Diagnosis present

## 2022-11-23 DIAGNOSIS — E1169 Type 2 diabetes mellitus with other specified complication: Secondary | ICD-10-CM | POA: Diagnosis present

## 2022-11-23 DIAGNOSIS — K219 Gastro-esophageal reflux disease without esophagitis: Secondary | ICD-10-CM | POA: Diagnosis present

## 2022-11-23 DIAGNOSIS — F410 Panic disorder [episodic paroxysmal anxiety] without agoraphobia: Secondary | ICD-10-CM | POA: Diagnosis present

## 2022-11-23 DIAGNOSIS — Z794 Long term (current) use of insulin: Secondary | ICD-10-CM

## 2022-11-23 DIAGNOSIS — X58XXXA Exposure to other specified factors, initial encounter: Secondary | ICD-10-CM | POA: Diagnosis present

## 2022-11-23 DIAGNOSIS — R0603 Acute respiratory distress: Secondary | ICD-10-CM

## 2022-11-23 DIAGNOSIS — J9621 Acute and chronic respiratory failure with hypoxia: Secondary | ICD-10-CM | POA: Diagnosis present

## 2022-11-23 DIAGNOSIS — M069 Rheumatoid arthritis, unspecified: Secondary | ICD-10-CM | POA: Diagnosis present

## 2022-11-23 DIAGNOSIS — Z885 Allergy status to narcotic agent status: Secondary | ICD-10-CM

## 2022-11-23 DIAGNOSIS — I509 Heart failure, unspecified: Secondary | ICD-10-CM

## 2022-11-23 DIAGNOSIS — E1151 Type 2 diabetes mellitus with diabetic peripheral angiopathy without gangrene: Secondary | ICD-10-CM | POA: Diagnosis present

## 2022-11-23 DIAGNOSIS — I5042 Chronic combined systolic (congestive) and diastolic (congestive) heart failure: Secondary | ICD-10-CM | POA: Diagnosis present

## 2022-11-23 DIAGNOSIS — Z66 Do not resuscitate: Secondary | ICD-10-CM | POA: Diagnosis present

## 2022-11-23 DIAGNOSIS — N182 Chronic kidney disease, stage 2 (mild): Secondary | ICD-10-CM

## 2022-11-23 DIAGNOSIS — Z940 Kidney transplant status: Secondary | ICD-10-CM

## 2022-11-23 DIAGNOSIS — F32A Depression, unspecified: Secondary | ICD-10-CM | POA: Diagnosis present

## 2022-11-23 DIAGNOSIS — I2489 Other forms of acute ischemic heart disease: Secondary | ICD-10-CM | POA: Diagnosis present

## 2022-11-23 DIAGNOSIS — E1165 Type 2 diabetes mellitus with hyperglycemia: Secondary | ICD-10-CM | POA: Diagnosis present

## 2022-11-23 DIAGNOSIS — T8610 Unspecified complication of kidney transplant: Secondary | ICD-10-CM | POA: Diagnosis present

## 2022-11-23 LAB — CBC WITH DIFFERENTIAL/PLATELET
Abs Immature Granulocytes: 0.03 10*3/uL (ref 0.00–0.07)
Basophils Absolute: 0 10*3/uL (ref 0.0–0.1)
Basophils Relative: 1 %
Eosinophils Absolute: 0.1 10*3/uL (ref 0.0–0.5)
Eosinophils Relative: 2 %
HCT: 28.7 % — ABNORMAL LOW (ref 36.0–46.0)
Hemoglobin: 9.1 g/dL — ABNORMAL LOW (ref 12.0–15.0)
Immature Granulocytes: 1 %
Lymphocytes Relative: 16 %
Lymphs Abs: 0.9 10*3/uL (ref 0.7–4.0)
MCH: 29.9 pg (ref 26.0–34.0)
MCHC: 31.7 g/dL (ref 30.0–36.0)
MCV: 94.4 fL (ref 80.0–100.0)
Monocytes Absolute: 0.6 10*3/uL (ref 0.1–1.0)
Monocytes Relative: 11 %
Neutro Abs: 4.1 10*3/uL (ref 1.7–7.7)
Neutrophils Relative %: 69 %
Platelets: 339 10*3/uL (ref 150–400)
RBC: 3.04 MIL/uL — ABNORMAL LOW (ref 3.87–5.11)
RDW: 19 % — ABNORMAL HIGH (ref 11.5–15.5)
WBC: 5.7 10*3/uL (ref 4.0–10.5)
nRBC: 0 % (ref 0.0–0.2)

## 2022-11-23 LAB — BASIC METABOLIC PANEL
Anion gap: 12 (ref 5–15)
BUN: 16 mg/dL (ref 8–23)
CO2: 26 mmol/L (ref 22–32)
Calcium: 9 mg/dL (ref 8.9–10.3)
Chloride: 96 mmol/L — ABNORMAL LOW (ref 98–111)
Creatinine, Ser: 1.08 mg/dL — ABNORMAL HIGH (ref 0.44–1.00)
GFR, Estimated: 57 mL/min — ABNORMAL LOW (ref >60)
Glucose, Bld: 203 mg/dL — ABNORMAL HIGH (ref 70–99)
Potassium: 4.5 mmol/L (ref 3.5–5.1)
Sodium: 134 mmol/L — ABNORMAL LOW (ref 135–145)

## 2022-11-23 LAB — HIV ANTIBODY (ROUTINE TESTING W REFLEX): HIV Screen 4th Generation wRfx: NONREACTIVE

## 2022-11-23 LAB — BRAIN NATRIURETIC PEPTIDE: B Natriuretic Peptide: 993.2 pg/mL — ABNORMAL HIGH (ref 0.0–100.0)

## 2022-11-23 LAB — TROPONIN I (HIGH SENSITIVITY)
Troponin I (High Sensitivity): 25 ng/L — ABNORMAL HIGH (ref <18)
Troponin I (High Sensitivity): 26 ng/L — ABNORMAL HIGH (ref <18)

## 2022-11-23 LAB — GLUCOSE, CAPILLARY
Glucose-Capillary: 135 mg/dL — ABNORMAL HIGH (ref 70–99)
Glucose-Capillary: 191 mg/dL — ABNORMAL HIGH (ref 70–99)

## 2022-11-23 MED ORDER — APIXABAN 5 MG PO TABS
5.0000 mg | ORAL_TABLET | Freq: Two times a day (BID) | ORAL | Status: DC
  Administered 2022-11-23 – 2022-11-26 (×6): 5 mg via ORAL
  Filled 2022-11-23 (×6): qty 1

## 2022-11-23 MED ORDER — SENNOSIDES-DOCUSATE SODIUM 8.6-50 MG PO TABS: 1.0000 | ORAL_TABLET | Freq: Every evening | ORAL | Status: DC | PRN

## 2022-11-23 MED ORDER — GUAIFENESIN 100 MG/5ML PO LIQD: 5.0000 mL | ORAL | Status: DC | PRN

## 2022-11-23 MED ORDER — OXYCODONE HCL 5 MG PO TABS
5.0000 mg | ORAL_TABLET | ORAL | Status: DC | PRN
  Administered 2022-11-23 – 2022-11-24 (×2): 5 mg via ORAL
  Filled 2022-11-23 (×2): qty 1

## 2022-11-23 MED ORDER — CLOPIDOGREL BISULFATE 75 MG PO TABS
75.0000 mg | ORAL_TABLET | Freq: Every day | ORAL | Status: DC
  Administered 2022-11-24 – 2022-11-26 (×3): 75 mg via ORAL
  Filled 2022-11-23 (×3): qty 1

## 2022-11-23 MED ORDER — IPRATROPIUM-ALBUTEROL 0.5-2.5 (3) MG/3ML IN SOLN
3.0000 mL | Freq: Once | RESPIRATORY_TRACT | Status: AC
  Administered 2022-11-23: 3 mL via RESPIRATORY_TRACT
  Filled 2022-11-23: qty 3

## 2022-11-23 MED ORDER — LORAZEPAM 1 MG PO TABS: 1.0000 mg | ORAL_TABLET | Freq: Three times a day (TID) | ORAL | 0 refills | Status: AC | PRN

## 2022-11-23 MED ORDER — INSULIN GLARGINE-YFGN 100 UNIT/ML ~~LOC~~ SOLN
8.0000 [IU] | Freq: Every day | SUBCUTANEOUS | Status: DC
  Administered 2022-11-24 – 2022-11-26 (×3): 8 [IU] via SUBCUTANEOUS
  Filled 2022-11-23 (×3): qty 0.08

## 2022-11-23 MED ORDER — AZATHIOPRINE 50 MG PO TABS
125.0000 mg | ORAL_TABLET | Freq: Every day | ORAL | Status: DC
  Administered 2022-11-24 – 2022-11-26 (×3): 125 mg via ORAL
  Filled 2022-11-23 (×3): qty 3

## 2022-11-23 MED ORDER — INSULIN ASPART 100 UNIT/ML IJ SOLN
0.0000 [IU] | Freq: Three times a day (TID) | INTRAMUSCULAR | Status: DC
  Administered 2022-11-24: 5 [IU] via SUBCUTANEOUS
  Administered 2022-11-24 (×2): 2 [IU] via SUBCUTANEOUS
  Administered 2022-11-25: 1 [IU] via SUBCUTANEOUS
  Administered 2022-11-25 (×2): 2 [IU] via SUBCUTANEOUS

## 2022-11-23 MED ORDER — HYDRALAZINE HCL 20 MG/ML IJ SOLN: 10.0000 mg | INTRAMUSCULAR | Status: DC | PRN

## 2022-11-23 MED ORDER — CYCLOBENZAPRINE HCL 10 MG PO TABS
10.0000 mg | ORAL_TABLET | Freq: Every day | ORAL | Status: DC | PRN
  Administered 2022-11-24 – 2022-11-25 (×2): 10 mg via ORAL
  Filled 2022-11-23 (×2): qty 1

## 2022-11-23 MED ORDER — IPRATROPIUM-ALBUTEROL 0.5-2.5 (3) MG/3ML IN SOLN: 3.0000 mL | RESPIRATORY_TRACT | Status: DC | PRN

## 2022-11-23 MED ORDER — LORAZEPAM 1 MG PO TABS
0.5000 mg | ORAL_TABLET | Freq: Three times a day (TID) | ORAL | Status: DC | PRN
  Administered 2022-11-23 – 2022-11-24 (×2): 0.5 mg via ORAL
  Filled 2022-11-23 (×2): qty 1

## 2022-11-23 MED ORDER — PANTOPRAZOLE SODIUM 40 MG PO TBEC
40.0000 mg | DELAYED_RELEASE_TABLET | Freq: Every day | ORAL | Status: DC
  Administered 2022-11-24 – 2022-11-26 (×3): 40 mg via ORAL
  Filled 2022-11-23 (×3): qty 1

## 2022-11-23 MED ORDER — CALCITRIOL 0.25 MCG PO CAPS
0.2500 ug | ORAL_CAPSULE | ORAL | Status: DC
  Administered 2022-11-24: 0.25 ug via ORAL
  Filled 2022-11-23: qty 1

## 2022-11-23 MED ORDER — ACETAMINOPHEN 325 MG PO TABS: 650.0000 mg | ORAL_TABLET | Freq: Four times a day (QID) | ORAL | Status: DC | PRN

## 2022-11-23 MED ORDER — ROSUVASTATIN CALCIUM 20 MG PO TABS
20.0000 mg | ORAL_TABLET | Freq: Every day | ORAL | Status: DC
  Administered 2022-11-24 – 2022-11-26 (×3): 20 mg via ORAL
  Filled 2022-11-23 (×3): qty 1

## 2022-11-23 MED ORDER — ARFORMOTEROL TARTRATE 15 MCG/2ML IN NEBU
15.0000 ug | INHALATION_SOLUTION | Freq: Two times a day (BID) | RESPIRATORY_TRACT | Status: DC
  Administered 2022-11-23 – 2022-11-24 (×3): 15 ug via RESPIRATORY_TRACT
  Filled 2022-11-23 (×3): qty 2

## 2022-11-23 MED ORDER — PROMETHAZINE HCL 25 MG PO TABS: 25.0000 mg | ORAL_TABLET | Freq: Four times a day (QID) | ORAL | Status: DC | PRN

## 2022-11-23 MED ORDER — LAMOTRIGINE 25 MG PO TABS
25.0000 mg | ORAL_TABLET | Freq: Every day | ORAL | Status: DC
  Administered 2022-11-23 – 2022-11-25 (×3): 25 mg via ORAL
  Filled 2022-11-23 (×3): qty 1

## 2022-11-23 MED ORDER — METOPROLOL TARTRATE 5 MG/5ML IV SOLN: 5.0000 mg | INTRAVENOUS | Status: DC | PRN

## 2022-11-23 MED ORDER — GUAIFENESIN-DM 100-10 MG/5ML PO SYRP: 15.0000 mL | ORAL_SOLUTION | ORAL | 0 refills | Status: DC | PRN

## 2022-11-23 MED ORDER — ISOSORBIDE MONONITRATE ER 30 MG PO TB24
30.0000 mg | ORAL_TABLET | Freq: Every day | ORAL | Status: DC
  Administered 2022-11-24 – 2022-11-26 (×3): 30 mg via ORAL
  Filled 2022-11-23 (×3): qty 1

## 2022-11-23 MED ORDER — RANOLAZINE ER 500 MG PO TB12
1000.0000 mg | ORAL_TABLET | Freq: Two times a day (BID) | ORAL | Status: DC
  Administered 2022-11-23 – 2022-11-26 (×6): 1000 mg via ORAL
  Filled 2022-11-23 (×6): qty 2

## 2022-11-23 MED ORDER — GABAPENTIN 100 MG PO CAPS: 100.0000 mg | ORAL_CAPSULE | Freq: Every day | ORAL | Status: DC | PRN

## 2022-11-23 MED ORDER — TRAZODONE HCL 50 MG PO TABS: 50.0000 mg | ORAL_TABLET | Freq: Every evening | ORAL | Status: DC | PRN

## 2022-11-23 MED ORDER — METOPROLOL TARTRATE 50 MG PO TABS
50.0000 mg | ORAL_TABLET | Freq: Two times a day (BID) | ORAL | Status: DC
  Administered 2022-11-23 – 2022-11-25 (×4): 50 mg via ORAL
  Filled 2022-11-23: qty 2
  Filled 2022-11-23 (×2): qty 1
  Filled 2022-11-23: qty 2

## 2022-11-23 MED ORDER — BUDESONIDE 0.5 MG/2ML IN SUSP: 2.0000 mL | Freq: Two times a day (BID) | RESPIRATORY_TRACT | Status: DC | PRN

## 2022-11-23 MED ORDER — FUROSEMIDE 10 MG/ML IJ SOLN
40.0000 mg | Freq: Two times a day (BID) | INTRAMUSCULAR | Status: DC
  Administered 2022-11-24 – 2022-11-26 (×5): 40 mg via INTRAVENOUS
  Filled 2022-11-23 (×5): qty 4

## 2022-11-23 MED ORDER — SERTRALINE HCL 100 MG PO TABS
200.0000 mg | ORAL_TABLET | Freq: Every day | ORAL | Status: DC
  Administered 2022-11-24 – 2022-11-26 (×3): 200 mg via ORAL
  Filled 2022-11-23 (×3): qty 2

## 2022-11-23 MED ORDER — FUROSEMIDE 40 MG PO TABS
40.0000 mg | ORAL_TABLET | Freq: Every day | ORAL | 0 refills | Status: DC
Start: 2022-11-23 — End: 2022-11-26

## 2022-11-23 MED ORDER — WITCH HAZEL-GLYCERIN EX PADS: MEDICATED_PAD | CUTANEOUS | 12 refills | Status: DC | PRN

## 2022-11-23 MED ORDER — PREDNISONE 5 MG PO TABS
5.0000 mg | ORAL_TABLET | Freq: Every day | ORAL | Status: DC
  Administered 2022-11-24 – 2022-11-26 (×3): 5 mg via ORAL
  Filled 2022-11-23 (×3): qty 1

## 2022-11-23 MED ORDER — ONDANSETRON HCL 4 MG/2ML IJ SOLN
4.0000 mg | Freq: Four times a day (QID) | INTRAMUSCULAR | Status: DC | PRN
  Filled 2022-11-23: qty 2

## 2022-11-23 MED ORDER — MONTELUKAST SODIUM 10 MG PO TABS
10.0000 mg | ORAL_TABLET | Freq: Every day | ORAL | Status: DC
  Administered 2022-11-23 – 2022-11-25 (×3): 10 mg via ORAL
  Filled 2022-11-23 (×3): qty 1

## 2022-11-23 MED ORDER — NITROGLYCERIN 0.4 MG SL SUBL: 0.4000 mg | SUBLINGUAL_TABLET | SUBLINGUAL | Status: DC | PRN

## 2022-11-23 MED ORDER — EMPAGLIFLOZIN 10 MG PO TABS
10.0000 mg | ORAL_TABLET | Freq: Every day | ORAL | Status: DC
  Administered 2022-11-24 – 2022-11-26 (×3): 10 mg via ORAL
  Filled 2022-11-23 (×3): qty 1

## 2022-11-23 MED ORDER — HYDROCORTISONE ACETATE 25 MG RE SUPP: 25.0000 mg | Freq: Two times a day (BID) | RECTAL | 0 refills | Status: DC

## 2022-11-23 MED ORDER — OXYCODONE HCL 10 MG PO TABS
10.0000 mg | ORAL_TABLET | Freq: Three times a day (TID) | ORAL | 0 refills | Status: AC | PRN
Start: 2022-11-23 — End: 2022-11-26

## 2022-11-23 MED ORDER — FUROSEMIDE 10 MG/ML IJ SOLN
40.0000 mg | Freq: Once | INTRAMUSCULAR | Status: AC
  Administered 2022-11-23: 40 mg via INTRAVENOUS
  Filled 2022-11-23: qty 4

## 2022-11-23 MED ORDER — INSULIN GLARGINE-YFGN 100 UNIT/ML ~~LOC~~ SOLN: 8.0000 [IU] | Freq: Every day | SUBCUTANEOUS | 11 refills | Status: DC

## 2022-11-23 NOTE — ED Notes (Signed)
RN informed provider when pt is speaking O2 drops in 80's and when pt is resting she is 89-90%.

## 2022-11-23 NOTE — TOC Progression Note (Signed)
Transition of Care [facility]) - Progression Note    Patient Details  Name: Katherine [name] MRN: [id number] Date of Birth: Nov 15, 1955  [facility]) CM/SW Contact  Delilah Shan, LCSWA Phone Number: 11/23/2022, 11:06 AM  Clinical Narrative:     CSW informed by [facility] patients insurance authorization has been approved.CSW spoke with [place] who informed CSW that patient can DC over today if medically ready. CSW informed MD. CSW will continue to follow and assist with patients dc planning needs.  Expected Discharge Plan: Skilled Nursing Facility Barriers to Discharge: Insurance Authorization  Expected Discharge Plan and Services                                               Social Determinants of Health (SDOH) Interventions SDOH Screenings   Food Insecurity: No Food Insecurity (11/12/2022)  Housing: Low Risk  (11/12/2022)  Transportation Needs: Unmet Transportation Needs (11/12/2022)  Utilities: At Risk (11/12/2022)  Alcohol Screen: Low Risk  (12/10/2021)  Depression (PHQ2-9): Medium Risk (02/02/2022)  Tobacco Use: Medium Risk (11/12/2022)    Readmission Risk Interventions    11/09/2022    2:22 PM 12/12/2021   10:01 AM 09/23/2021    4:39 PM  Readmission Risk Prevention Plan  Transportation Screening Complete Complete Complete  Medication Review (RN Care Manager) Referral to Pharmacy Complete Complete  PCP or Specialist appointment within 3-5 days of discharge  Complete Complete  HRI or Home Care Consult Complete Complete Complete  SW Recovery Care/Counseling Consult Complete Complete Complete  Palliative Care Screening Not Applicable Not Applicable Complete  Skilled Nursing Facility Patient Refused Not Applicable Not Applicable

## 2022-11-23 NOTE — ED Notes (Signed)
O2 dropped back to 6L

## 2022-11-23 NOTE — ED Notes (Addendum)
RN switched pt nasal canult to HFNC 6 L  While on 6 L pt O2 86%, RN adjusted to 10L 93%   EDP aware

## 2022-11-23 NOTE — ED Notes (Signed)
Pt is tearful at bedside d/t not wanting to go back to facility. EDP ensured pt that she can reach out to social work to assist with finding a new facility. EDP explained before she is sent back RN will call facility to make sure she has the oxygen requirements that is needed. Pt became tearful because she want to stay at the hospital for at least two days to find a new facility.   RN stayed at bedside coaching pt to take deep breaths d/t crying. RN informed pt she will not be sent back until facility can provide the things she need. Pt requested RN to call niece. RN call niece but went to voicemail.

## 2022-11-23 NOTE — ED Notes (Signed)
Attempted to contact [facility], unable to get past voicemail message. Message left for them to contact us.

## 2022-11-23 NOTE — Progress Notes (Signed)
Reports given to SNF receiving nurse Claudine Mouton, RN. Questions answered and no concerns verbalized. Awaiting PTAR to transfer patient out

## 2022-11-23 NOTE — ED Notes (Signed)
Pt desating to 85% on 8L, O2 increased to 10L. MD informed.

## 2022-11-23 NOTE — ED Notes (Signed)
RN notified phlebotomy. Pt is hard stick

## 2022-11-23 NOTE — Plan of Care (Signed)
  Problem: Education: Goal: Understanding of cardiac disease, CV risk reduction, and recovery process will improve Outcome: Adequate for Discharge Goal: Individualized Educational Video(s) Outcome: Adequate for Discharge   Problem: Activity: Goal: Ability to tolerate increased activity will improve Outcome: Adequate for Discharge   Problem: Cardiac: Goal: Ability to achieve and maintain adequate cardiovascular perfusion will improve Outcome: Adequate for Discharge   Problem: Health Behavior/Discharge Planning: Goal: Ability to safely manage health-related needs after discharge will improve Outcome: Adequate for Discharge   Problem: Education: Goal: Ability to describe self-care measures that may prevent or decrease complications (Diabetes Survival Skills Education) will improve Outcome: Adequate for Discharge Goal: Individualized Educational Video(s) Outcome: Adequate for Discharge   Problem: Coping: Goal: Ability to adjust to condition or change in health will improve Outcome: Adequate for Discharge   Problem: Fluid Volume: Goal: Ability to maintain a balanced intake and output will improve Outcome: Adequate for Discharge   Problem: Health Behavior/Discharge Planning: Goal: Ability to identify and utilize available resources and services will improve Outcome: Adequate for Discharge Goal: Ability to manage health-related needs will improve Outcome: Adequate for Discharge   Problem: Metabolic: Goal: Ability to maintain appropriate glucose levels will improve Outcome: Adequate for Discharge   Problem: Nutritional: Goal: Maintenance of adequate nutrition will improve Outcome: Adequate for Discharge Goal: Progress toward achieving an optimal weight will improve Outcome: Adequate for Discharge   Problem: Skin Integrity: Goal: Risk for impaired skin integrity will decrease Outcome: Adequate for Discharge   Problem: Tissue Perfusion: Goal: Adequacy of tissue perfusion  will improve Outcome: Adequate for Discharge   Problem: Education: Goal: Knowledge of General Education information will improve Description: Including pain rating scale, medication(s)/side effects and non-pharmacologic comfort measures Outcome: Adequate for Discharge   Problem: Health Behavior/Discharge Planning: Goal: Ability to manage health-related needs will improve Outcome: Adequate for Discharge   Problem: Clinical Measurements: Goal: Ability to maintain clinical measurements within normal limits will improve Outcome: Adequate for Discharge Goal: Will remain free from infection Outcome: Adequate for Discharge Goal: Diagnostic test results will improve Outcome: Adequate for Discharge Goal: Respiratory complications will improve Outcome: Adequate for Discharge Goal: Cardiovascular complication will be avoided Outcome: Adequate for Discharge   Problem: Activity: Goal: Risk for activity intolerance will decrease Outcome: Adequate for Discharge   Problem: Nutrition: Goal: Adequate nutrition will be maintained Outcome: Adequate for Discharge   Problem: Coping: Goal: Level of anxiety will decrease Outcome: Adequate for Discharge   Problem: Elimination: Goal: Will not experience complications related to bowel motility Outcome: Adequate for Discharge Goal: Will not experience complications related to urinary retention Outcome: Adequate for Discharge   Problem: Pain Managment: Goal: General experience of comfort will improve Outcome: Adequate for Discharge   Problem: Safety: Goal: Ability to remain free from injury will improve Outcome: Adequate for Discharge   Problem: Skin Integrity: Goal: Risk for impaired skin integrity will decrease Outcome: Adequate for Discharge   

## 2022-11-23 NOTE — ED Triage Notes (Signed)
Pt bib ems from [facility] c/o SOB d/t not having adequate oxygen at facility. Pt states the concentrator there only went up to 4L and pt says she normally at 7L baseline.   BP 152/100 6L at 92% placed on nonrebreather 10L 98% HR 86

## 2022-11-23 NOTE — Care Management Important Message (Signed)
Important Message  Patient Details  Name: Katherine [name] MRN: [id number] Date of Birth: April 24, 1956   Medicare Important Message Given:  Yes     Renie Ora 11/23/2022, 12:13 PM

## 2022-11-23 NOTE — Progress Notes (Signed)
TOC CSW attempting to assist with contacting [facility].  @8 :24pm Emily/Admissions [phone], no answer.  CSW left HIPPA compliant message with my contact information.   @8 :29pm Whitney Durner [phone], no answer.  CSW left HIPPA compliant message with my contact information.  @8 :30pm Main Number [phone], no answer.  CSW left HIPPA compliant message with my contact information.   @8 :33pm Kristine [phone], no answer.  CSW left HIPPA compliant message with my contact information.   @8 :36pm Kristine returned CSW's call.  Barkley Bruns provided CSW with DON's number, Sana [phone].    @8 :41pm Sana stated pt could return and she has ordered everything pt needs to return.  Eloisa Northern also spoke with Amil Amen, Charity fundraiser.  Dawsen Krieger Tarpley-Carter, MSW, LCSW-A Pronouns:  She/Her/Hers [facility] Clinical Social Worker Direct Number:  [phone] [email or web address]

## 2022-11-23 NOTE — ED Provider Notes (Signed)
[place] EMERGENCY DEPARTMENT AT [facility] Provider Note   CSN: [id number] Arrival date & time: 11/23/22  1627     History Chief Complaint  Patient presents with   Shortness of Breath    HPI Katherine [name] is a 67 y.o. female presenting for shortness of breath from her facility.  Patient was discharged from the hospital earlier today for shortness of breath admission thought to be secondary to chronic hypoxic respiratory failure.  She was referred to a skilled nursing facility.  Arrived at the skilled nursing facility with an oxygen concentrator that only concentrates at 4 L Then they plugged her into a oxygen tank and she stated her dyspnea got worse.  When EMS arrived, the oxygen tank was empty. Patient was desatting so placed on 6 L nasal cannula satting 92% at that time.  Ultimately transition to nonrebreather at 10 L. She denies fevers chills nausea vomiting syncope.  She endorses ongoing shortness of breath and chest pain similar to her presentation in the hospital this week.  Patient denies any new symptoms at this time.  Patient's recorded medical, surgical, social, medication list and allergies were reviewed in the Snapshot window as part of the initial history.   Review of Systems   Review of Systems  Constitutional:  Negative for chills and fever.  HENT:  Negative for ear pain and sore throat.   Eyes:  Negative for pain and visual disturbance.  Respiratory:  Positive for shortness of breath. Negative for cough.   Cardiovascular:  Negative for chest pain and palpitations.  Gastrointestinal:  Negative for abdominal pain and vomiting.  Genitourinary:  Negative for dysuria and hematuria.  Musculoskeletal:  Negative for arthralgias and back pain.  Skin:  Negative for color change and rash.  Neurological:  Negative for seizures and syncope.  All other systems reviewed and are negative.   Physical Exam Updated Vital Signs BP 103/61   Pulse 74   Temp (!)  97.5 F (36.4 C) (Oral)   Resp 20   Ht 5\' 1"  (1.549 m)   Wt 78.5 kg   SpO2 96%   BMI 32.69 kg/m  Physical Exam Vitals and nursing note reviewed.  Constitutional:      General: She is not in acute distress.    Appearance: She is well-developed.  HENT:     Head: Normocephalic and atraumatic.  Eyes:     Conjunctiva/sclera: Conjunctivae normal.  Cardiovascular:     Rate and Rhythm: Normal rate and regular rhythm.     Heart sounds: No murmur heard. Pulmonary:     Effort: Pulmonary effort is normal. No respiratory distress.     Breath sounds: Normal breath sounds.  Abdominal:     Palpations: Abdomen is soft.     Tenderness: There is no abdominal tenderness.  Musculoskeletal:        General: No swelling.     Cervical back: Neck supple.  Skin:    General: Skin is warm and dry.     Capillary Refill: Capillary refill takes less than 2 seconds.  Neurological:     Mental Status: She is alert.  Psychiatric:        Mood and Affect: Mood normal.      ED Course/ Medical Decision Making/ A&P    Procedures .Critical Care  Performed by: Glyn Ade, MD Authorized by: Glyn Ade, MD   Critical care provider statement:    Critical care time (minutes):  30   Critical care was necessary to  treat or prevent imminent or life-threatening deterioration of the following conditions:  Respiratory failure   Critical care was time spent personally by me on the following activities:  Development of treatment plan with patient or surrogate, discussions with consultants, evaluation of patient's response to treatment, examination of patient, ordering and review of laboratory studies, ordering and review of radiographic studies, ordering and performing treatments and interventions, pulse oximetry, re-evaluation of patient's condition and review of old charts   Care discussed with: admitting provider      Medications Ordered in ED Medications  isosorbide mononitrate (IMDUR) 24 hr  tablet 30 mg (has no administration in time range)  metoprolol tartrate (LOPRESSOR) tablet 50 mg (50 mg Oral Given 11/23/22 2301)  nitroGLYCERIN (NITROSTAT) SL tablet 0.4 mg (has no administration in time range)  ranolazine (RANEXA) 12 hr tablet 1,000 mg (1,000 mg Oral Given 11/23/22 2305)  rosuvastatin (CRESTOR) tablet 20 mg (has no administration in time range)  sertraline (ZOLOFT) tablet 200 mg (has no administration in time range)  calcitRIOL (ROCALTROL) capsule 0.25 mcg (has no administration in time range)  empagliflozin (JARDIANCE) tablet 10 mg (has no administration in time range)  insulin glargine-yfgn (SEMGLEE) injection 8 Units (has no administration in time range)  predniSONE (DELTASONE) tablet 5 mg (has no administration in time range)  pantoprazole (PROTONIX) EC tablet 40 mg (has no administration in time range)  clopidogrel (PLAVIX) tablet 75 mg (has no administration in time range)  apixaban (ELIQUIS) tablet 5 mg (5 mg Oral Given 11/23/22 2301)  azaTHIOprine (IMURAN) tablet 125 mg (has no administration in time range)  cyclobenzaprine (FLEXERIL) tablet 10 mg (has no administration in time range)  gabapentin (NEURONTIN) capsule 100 mg (has no administration in time range)  lamoTRIgine (LAMICTAL) tablet 25 mg (25 mg Oral Given 11/23/22 2301)  arformoterol (BROVANA) nebulizer solution 15 mcg (15 mcg Nebulization Given 11/23/22 2309)  budesonide (PULMICORT) nebulizer solution 0.5 mg (has no administration in time range)  montelukast (SINGULAIR) tablet 10 mg (10 mg Oral Given 11/23/22 2301)  promethazine (PHENERGAN) tablet 25 mg (has no administration in time range)  ipratropium-albuterol (DUONEB) 0.5-2.5 (3) MG/3ML nebulizer solution 3 mL (has no administration in time range)  hydrALAZINE (APRESOLINE) injection 10 mg (has no administration in time range)  metoprolol tartrate (LOPRESSOR) injection 5 mg (has no administration in time range)  senna-docusate (Senokot-S) tablet 1 tablet (has no  administration in time range)  guaiFENesin (ROBITUSSIN) 100 MG/5ML liquid 5 mL (has no administration in time range)  traZODone (DESYREL) tablet 50 mg (has no administration in time range)  acetaminophen (TYLENOL) tablet 650 mg (has no administration in time range)  ondansetron (ZOFRAN) injection 4 mg (has no administration in time range)  furosemide (LASIX) injection 40 mg (has no administration in time range)  insulin aspart (novoLOG) injection 0-9 Units (has no administration in time range)  oxyCODONE (Oxy IR/ROXICODONE) immediate release tablet 5 mg (5 mg Oral Given 11/23/22 2242)  LORazepam (ATIVAN) tablet 0.5 mg (0.5 mg Oral Given 11/23/22 2300)  ipratropium-albuterol (DUONEB) 0.5-2.5 (3) MG/3ML nebulizer solution 3 mL (3 mLs Nebulization Given 11/23/22 1829)  furosemide (LASIX) injection 40 mg (40 mg Intravenous Given 11/23/22 2147)    Medical Decision Making:    Shawn StallRegina Lea Free is a 67 y.o. female who presented to the ED today with acute on chronic shortness of breath detailed above.     Handoff received from EMS.  Additional history discussed with patient's family/caregivers.  Patient's presentation is complicated by their history of multiple  comorbid medical problems.  Patient placed on continuous vitals and telemetry monitoring while in ED which was reviewed periodically.   Complete initial physical exam performed, notably the patient  was hemodynamically stable in no acute distress.      Reviewed and confirmed nursing documentation for past medical history, family history, social history.    Initial Assessment:  Patient's presentation is most consistent with chronic  Hypoxic respiratory failure.  It appears that her facility did not did not have her oxygen equipment ready for her on her arrival.  There was a period of desat because of this. Attempted to restabilize patient on her 6 L nasal cannula and she did not tolerate.  Continued to desat down to low 80s.  Had to be restarted  on high flow nasal cannula 10 L. Objective evaluation initiated because of these findings.  Reveal likely developing recurrent acute heart failure exacerbation with BNP at 995.  X-ray with diffuse disease. Given ongoing dyspnea, recurrent hypoxic respiratory failure and severity of symptoms, patient was rearranged for admission after initiation of diuresis in emergency room..  Disposition:   Based on the above findings, I believe this patient is stable for admission.    Patient/family educated about specific findings on our evaluation and explained exact reasons for admission.  Patient/family educated about clinical situation and time was allowed to answer questions.   Admission team communicated with and agreed with need for admission. Patient admitted. Patient ready to move at this time.     Emergency Department Medication Summary:   Medications  isosorbide mononitrate (IMDUR) 24 hr tablet 30 mg (has no administration in time range)  metoprolol tartrate (LOPRESSOR) tablet 50 mg (50 mg Oral Given 11/23/22 2301)  nitroGLYCERIN (NITROSTAT) SL tablet 0.4 mg (has no administration in time range)  ranolazine (RANEXA) 12 hr tablet 1,000 mg (1,000 mg Oral Given 11/23/22 2305)  rosuvastatin (CRESTOR) tablet 20 mg (has no administration in time range)  sertraline (ZOLOFT) tablet 200 mg (has no administration in time range)  calcitRIOL (ROCALTROL) capsule 0.25 mcg (has no administration in time range)  empagliflozin (JARDIANCE) tablet 10 mg (has no administration in time range)  insulin glargine-yfgn (SEMGLEE) injection 8 Units (has no administration in time range)  predniSONE (DELTASONE) tablet 5 mg (has no administration in time range)  pantoprazole (PROTONIX) EC tablet 40 mg (has no administration in time range)  clopidogrel (PLAVIX) tablet 75 mg (has no administration in time range)  apixaban (ELIQUIS) tablet 5 mg (5 mg Oral Given 11/23/22 2301)  azaTHIOprine (IMURAN) tablet 125 mg (has no  administration in time range)  cyclobenzaprine (FLEXERIL) tablet 10 mg (has no administration in time range)  gabapentin (NEURONTIN) capsule 100 mg (has no administration in time range)  lamoTRIgine (LAMICTAL) tablet 25 mg (25 mg Oral Given 11/23/22 2301)  arformoterol (BROVANA) nebulizer solution 15 mcg (15 mcg Nebulization Given 11/23/22 2309)  budesonide (PULMICORT) nebulizer solution 0.5 mg (has no administration in time range)  montelukast (SINGULAIR) tablet 10 mg (10 mg Oral Given 11/23/22 2301)  promethazine (PHENERGAN) tablet 25 mg (has no administration in time range)  ipratropium-albuterol (DUONEB) 0.5-2.5 (3) MG/3ML nebulizer solution 3 mL (has no administration in time range)  hydrALAZINE (APRESOLINE) injection 10 mg (has no administration in time range)  metoprolol tartrate (LOPRESSOR) injection 5 mg (has no administration in time range)  senna-docusate (Senokot-S) tablet 1 tablet (has no administration in time range)  guaiFENesin (ROBITUSSIN) 100 MG/5ML liquid 5 mL (has no administration in time range)  traZODone (DESYREL) tablet  50 mg (has no administration in time range)  acetaminophen (TYLENOL) tablet 650 mg (has no administration in time range)  ondansetron (ZOFRAN) injection 4 mg (has no administration in time range)  furosemide (LASIX) injection 40 mg (has no administration in time range)  insulin aspart (novoLOG) injection 0-9 Units (has no administration in time range)  oxyCODONE (Oxy IR/ROXICODONE) immediate release tablet 5 mg (5 mg Oral Given 11/23/22 2242)  LORazepam (ATIVAN) tablet 0.5 mg (0.5 mg Oral Given 11/23/22 2300)  ipratropium-albuterol (DUONEB) 0.5-2.5 (3) MG/3ML nebulizer solution 3 mL (3 mLs Nebulization Given 11/23/22 1829)  furosemide (LASIX) injection 40 mg (40 mg Intravenous Given 11/23/22 2147)         Emergency Department Medication Summary:   Medications  isosorbide mononitrate (IMDUR) 24 hr tablet 30 mg (has no administration in time range)  metoprolol  tartrate (LOPRESSOR) tablet 50 mg (50 mg Oral Given 11/23/22 2301)  nitroGLYCERIN (NITROSTAT) SL tablet 0.4 mg (has no administration in time range)  ranolazine (RANEXA) 12 hr tablet 1,000 mg (1,000 mg Oral Given 11/23/22 2305)  rosuvastatin (CRESTOR) tablet 20 mg (has no administration in time range)  sertraline (ZOLOFT) tablet 200 mg (has no administration in time range)  calcitRIOL (ROCALTROL) capsule 0.25 mcg (has no administration in time range)  empagliflozin (JARDIANCE) tablet 10 mg (has no administration in time range)  insulin glargine-yfgn (SEMGLEE) injection 8 Units (has no administration in time range)  predniSONE (DELTASONE) tablet 5 mg (has no administration in time range)  pantoprazole (PROTONIX) EC tablet 40 mg (has no administration in time range)  clopidogrel (PLAVIX) tablet 75 mg (has no administration in time range)  apixaban (ELIQUIS) tablet 5 mg (5 mg Oral Given 11/23/22 2301)  azaTHIOprine (IMURAN) tablet 125 mg (has no administration in time range)  cyclobenzaprine (FLEXERIL) tablet 10 mg (has no administration in time range)  gabapentin (NEURONTIN) capsule 100 mg (has no administration in time range)  lamoTRIgine (LAMICTAL) tablet 25 mg (25 mg Oral Given 11/23/22 2301)  arformoterol (BROVANA) nebulizer solution 15 mcg (15 mcg Nebulization Given 11/23/22 2309)  budesonide (PULMICORT) nebulizer solution 0.5 mg (has no administration in time range)  montelukast (SINGULAIR) tablet 10 mg (10 mg Oral Given 11/23/22 2301)  promethazine (PHENERGAN) tablet 25 mg (has no administration in time range)  ipratropium-albuterol (DUONEB) 0.5-2.5 (3) MG/3ML nebulizer solution 3 mL (has no administration in time range)  hydrALAZINE (APRESOLINE) injection 10 mg (has no administration in time range)  metoprolol tartrate (LOPRESSOR) injection 5 mg (has no administration in time range)  senna-docusate (Senokot-S) tablet 1 tablet (has no administration in time range)  guaiFENesin (ROBITUSSIN) 100  MG/5ML liquid 5 mL (has no administration in time range)  traZODone (DESYREL) tablet 50 mg (has no administration in time range)  acetaminophen (TYLENOL) tablet 650 mg (has no administration in time range)  ondansetron (ZOFRAN) injection 4 mg (has no administration in time range)  furosemide (LASIX) injection 40 mg (has no administration in time range)  insulin aspart (novoLOG) injection 0-9 Units (has no administration in time range)  oxyCODONE (Oxy IR/ROXICODONE) immediate release tablet 5 mg (5 mg Oral Given 11/23/22 2242)  LORazepam (ATIVAN) tablet 0.5 mg (0.5 mg Oral Given 11/23/22 2300)  ipratropium-albuterol (DUONEB) 0.5-2.5 (3) MG/3ML nebulizer solution 3 mL (3 mLs Nebulization Given 11/23/22 1829)  furosemide (LASIX) injection 40 mg (40 mg Intravenous Given 11/23/22 2147)      Clinical Impression: No diagnosis found.   Admit   Final Clinical Impression(s) / ED Diagnoses Final diagnoses:  None  Rx / DC Orders ED Discharge Orders     None         Glyn Ade, MD 11/23/22 2317

## 2022-11-23 NOTE — ED Notes (Signed)
Call placed to [name] Clinical research associate at [facility]) no answer and no voicemail to leave a message on.

## 2022-11-23 NOTE — ED Notes (Signed)
Called [facility] a number of 6 times, no option to reach operator, left 2 voice messages 1 written online message on website.

## 2022-11-23 NOTE — ED Notes (Signed)
O2 reduced to 6L HFNC, sats holding at 94%

## 2022-11-23 NOTE — ED Notes (Signed)
Spoke with Public relations account executive at [facility]) she states that they can only give a max of 5L o2 but will order pt a concentrator to be able to give her more however this wont be available until tomorrow. MD informed. Phone number to contact SNF RN: [phone]

## 2022-11-23 NOTE — ED Notes (Signed)
RN called [facility] 2x to get information about pt concentrator needs. NO answer  [facility] [phone]

## 2022-11-23 NOTE — Discharge Summary (Signed)
Physician Discharge Summary   Patient: Katherine [name] MRN: [id number] DOB: 04/25/1956  Admit date:     11/11/2022  Discharge date: 11/23/22  Discharge Physician: Kathlen Mody   PCP: [name], MD   Recommendations at discharge:  Please follow up with PCP in one week.  She will need surgery/ Dermatology follow up in one week for excisional biopsy of there left forearm skin lesion.  Please follow up with cbc and bmp in one week.  Recommend outpatient follow up with cardiology as scheduled.   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   Patient was hospitalized from 3/22-3/27 for NSTEMI / acute on chronic combined CHF.  Troponin trended from 27-428.  Cardiology consulted, pt not felt to be candidate for LHC due to vascular access issues and transplanted kidney.  TTE with LVEF of 35 to 40%, dyskinesis of mid to distal inferior septal wall and entire apex. Patient was diuresed with IV Lasix and cleared for discharge by cardiology on p.o. Lasix 40 mg twice daily.  Sherryll Burger was initially added but discontinued on discharge due to soft blood pressures.  Imdur was reduced to 30 mg daily as well.  Therapy recommended SNF but patient chose to go home with home health and DME.  Unfortunately, she didn't do well at all after EMS got to her house.  She became severely SOB just going up the front stairs and pt brought back to ED. Patient's dyspnea improved while in ED.  She had mild AKI.   AKI resolved.  Medically stable  for discharge pending SNF.[name] Kitchen  Now willing to go to SNF.  Assessment and Plan: DOE/COPD/chronic hypoxic RF on 5-7 L at baseline: Pt more sob this am. CXR shows worsening congestion. Extra dose of IV lasix given. Sob resolved. She is back on her home oxygen.  - she is on 6 lit of [place] oxygen with sats in 100%.  -Continue home inhalers    Chest pain/elevated troponin/CAD s/p CABG with multiple stenting: Felt to be demand ischemia -Previously evaluated by cardiology who recommended medical mgt -Continue Plavix, ranolazine, Crestor, metoprolol and Imdur -Outpatient follow-up with cardiology   Chronic combined CHF: TTE as above.   Diuresed appropriately.  -Resume Lasix at 40 mg daily -GDMT limited by soft BP. -continue with strict intake and output and daily weights.    AKI on CKD-3A/Hx of renal transplant: AKI resolved. -Continue home Imuran and prednisone - creatinine is 1.01.    Hypotension:  Resolved.    Uncontrolled IDDM-2 with hyperglycemia: A1c 8.5% in 05/2022. CBG (last 3)  Recent Labs    11/22/22 1739 11/22/22 2117 11/23/22 0741  GLUCAP 82 238* 135*    Decreased the semglee to 8 units and stopped the pre meal coverage.  Its from inadequate food intake.    Paroxysmal A-fib Rate controlled.  -Metoprolol, Eliquis   Debility: Therapy recommended SNF but patient chose to go home with home health previous hospitalization.  Now willing to go to SNF -[facility] consulted.   Anxiety and  depression: Poorly controlled. -Vraylar, Lamictal, Ativan, Zoloft   Chronic back pain, narcotic dependent: Narcotic database reviewed.    Grief: Lost her brother recently. -Emotional support -Chaplain consult if interested.   Morbid Obesity:  Body mass index is 38.49 kg/m.   Pressure Skin Injury: POA Stage 2 sacral pressure injury.  Pressure Injury 11/06/22 Sacrum Mid;Lower Stage 2 -  Partial thickness loss of dermis presenting as a shallow open injury with a red, pink wound bed without  slough. (Active)  11/06/22 2000  Location: Sacrum  Location Orientation: Mid;Lower  Staging: Stage 2 -  Partial thickness loss of dermis presenting as a shallow open injury with a red, pink wound bed without slough.  Wound Description (Comments):   Present on Admission: Yes  Wound care.     Left fore arm skin tear / skin wound with foam dressing in place.  Will need outpatient follow up with a dermatologist / surgery for excisional biopsy.     Consultants: none.  Procedures performed: none.   Disposition: Skilled nursing facility Diet recommendation:  Discharge Diet Orders (From admission, onward)     Start     Ordered   11/23/22 0000  Diet - low sodium heart healthy        11/23/22 1119           Cardiac and Carb modified diet DISCHARGE MEDICATION: Allergies as of 11/23/2022       Reactions   Tetracycline Hives   Patient tolerated Doxycycline Dec 2020   Niacin Other (See Comments)   Mouth blisters   Niaspan [niacin Er] Other (See Comments)   Mouth blisters   Sulfa Antibiotics Nausea Only, Other (See Comments)   "Tears up stomach"   Sulfonamide Derivatives Other (See Comments)   Reaction: per patient "tears her stomach up"   Codeine Nausea And Vomiting   Erythromycin Nausea And Vomiting   Hydromorphone Hcl Nausea And Vomiting   Morphine And Related Nausea And Vomiting   Nalbuphine Nausea And Vomiting   Nubain   Sulfasalazine Nausea Only, Other (See Comments)   per patient "tears her stomach up", "Tears up stomach"   Tape Rash, Other (See Comments)   No "plastic" tape," please----cloth tape only        Medication List     STOP taking these medications    Basaglar KwikPen 100 UNIT/ML   glimepiride 4 MG tablet Commonly known as: AMARYL   NovoLOG FlexPen 100 UNIT/ML FlexPen Generic drug: insulin aspart   OXYGEN       TAKE these medications    albuterol 108 (90 Base) MCG/ACT inhaler Commonly known as: Ventolin HFA TAKE 2 PUFFS BY MOUTH EVERY 6  HOURS AS NEEDED FOR WHEEZE OR SHORTNESS OF BREATH What changed:  how much to take how to take this when to take this reasons to take this additional instructions   arformoterol 15 MCG/2ML Nebu Commonly known as: BROVANA Take 2 mLs (15 mcg total) by nebulization 2 (two) times daily.   azaTHIOprine 50 MG tablet Commonly known as: IMURAN Take 2 & 1/2 tablets (125 mg) by mouth daily at 3pm What changed: when to take this   bisacodyl 5 MG EC tablet Commonly known as: DULCOLAX Take 1 tablet (5 mg total) by mouth daily as needed for moderate constipation.   budesonide 0.5 MG/2ML nebulizer solution Commonly known as: PULMICORT Take 2 mLs (0.5 mg total) by nebulization 2 (two) times daily. What changed:  when to take this reasons to take this   calcitRIOL  0.25 MCG capsule Commonly known as: ROCALTROL Take 0.25 mcg by mouth See admin instructions. Takes every 3 days.   clopidogrel 75 MG tablet Commonly known as: PLAVIX Take 1 tablet (75 mg total) by mouth daily.   [employer] Vitamin C 500 MG tablet Generic drug: ascorbic acid TAKE 1 TABLET (500 MG TOTAL) BY MOUTH DAILY. What changed: how much to take   cyclobenzaprine 10 MG tablet Commonly known as: FLEXERIL TAKE 1 TABLET BY MOUTH TWICE A DAY AS NEEDED FOR MUSCLE SPASMS What changed: See the new instructions.   Eliquis 5 MG Tabs tablet Generic drug: apixaban TAKE 1 TABLET BY MOUTH TWICE A DAY   Ensure Max Protein Liqd Take 330 mLs (11 oz total) by mouth 2 (two) times daily.   fluticasone 50 MCG/ACT nasal spray Commonly known as: FLONASE PLACE 2 SPRAYS INTO BOTH NOSTRILS 2 TIMES DAILY.   FreeStyle Libre 2 Reader DunstanDevi 1 Act by Does not apply route daily.   FreeStyle Libre 2 Sensor Misc 1 Act by Does not apply route daily.   furosemide 40 MG tablet Commonly known as: LASIX Take 1 tablet (40 mg total) by mouth daily. What changed: when to take this   gabapentin 100 MG capsule Commonly known as: NEURONTIN Take 1  capsule (100 mg total) by mouth at bedtime. What changed:  when to take this reasons to take this   guaiFENesin-dextromethorphan 100-10 MG/5ML syrup Commonly known as: ROBITUSSIN DM Take 15 mLs by mouth every 4 (four) hours as needed for cough.   hydrocortisone 25 MG suppository Commonly known as: ANUSOL-HC Place 1 suppository (25 mg total) rectally 2 (two) times daily.   insulin glargine-yfgn 100 UNIT/ML injection Commonly known as: SEMGLEE Inject 0.08 mLs (8 Units total) into the skin daily. Start taking on: November 24, 2022   isosorbide mononitrate 30 MG 24 hr tablet Commonly known as: IMDUR Take 1 tablet (30 mg total) by mouth daily.   Jardiance 10 MG Tabs tablet Generic drug: empagliflozin Take 1 tablet (10 mg total) by mouth daily.   Klor-Con M10 10 MEQ tablet Generic drug: potassium chloride TAKE 1 TABLET BY MOUTH 2 TIMES DAILY.   lamoTRIgine 25 MG tablet Commonly known as: LAMICTAL TAKE 1 TABLET BY MOUTH EVERYDAY AT BEDTIME What changed: See the new instructions.   LORazepam 1 MG tablet Commonly known as: ATIVAN Take 1 tablet (1 mg total) by mouth every 8 (eight) hours as needed for up to 3 days for anxiety.   metFORMIN 500 MG tablet Commonly known as: GLUCOPHAGE Take 500 mg by mouth 2 (two) times daily.   metoprolol tartrate 50 MG tablet Commonly known as: LOPRESSOR Take 1 tablet (50 mg total) by mouth 2 (two) times daily.   montelukast 10 MG tablet Commonly known as: SINGULAIR TAKE 1 TABLET BY MOUTH EVERYDAY AT BEDTIME What changed: See the new instructions.   multivitamin with minerals Tabs tablet Take 1 tablet by mouth daily.   nitroGLYCERIN 0.4 MG SL tablet Commonly known as: NITROSTAT PLACE 1 TAB UNDER TONGUE EVERY 5MINS FOR CHEST PAIN What changed:  how much to take how to take this when to take this reasons to take this additional instructions   Oxycodone HCl 10 MG Tabs Take 1 tablet (10 mg total) by mouth every 8 (eight) hours as needed  for up to 3 days. What changed: reasons to take this   pantoprazole 40 MG tablet Commonly known as: PROTONIX TAKE 1 TABLET BY MOUTH EVERY DAY   Pen Needles 32G  X 4 MM Misc Use as needed to administer insulin. E11.9   predniSONE 5 MG tablet Commonly known as: DELTASONE Take 5 mg by mouth daily with breakfast.   promethazine 25 MG tablet Commonly known as: PHENERGAN Take 1 tablet (25 mg total) by mouth every 6 (six) hours as needed for nausea or vomiting.   ranolazine 1000 MG SR tablet Commonly known as: RANEXA TAKE 1 TABLET BY MOUTH TWICE A DAY   rosuvastatin 20 MG tablet Commonly known as: CRESTOR TAKE 1 TABLET BY MOUTH EVERY DAY   sertraline 100 MG tablet Commonly known as: ZOLOFT Take 2 tablets (200 mg total) by mouth daily.   witch hazel-glycerin pad Commonly known as: TUCKS Apply topically as needed for itching.               Discharge Care Instructions  (From admission, onward)           Start     Ordered   11/23/22 0000  Discharge wound care:       Comments: Wound care to left forearm area of raised hyperkeratosis:  Wash with soap and water, rinse and dry. Cover with xeroform gauze [place] # [id number]), to pwith dry gauze and secure with a few turns of Conform bandaging/paper tape. Change M/W/F.   11/23/22 1119            Contact information for follow-up providers     Joylene Grapes, NP Follow up on 11/20/2022.   Specialties: Cardiology, Family Medicine Why: 2:45 pm Contact information: [address] [place] [address] [phone]              Contact information for after-discharge care     Destination     [facility] Preferred SNF .   Service: Skilled Nursing Contact information: [address] [place] [address] [phone]                    Discharge Exam: Ceasar Mons Weights   11/12/22 0811 11/12/22 0857  Weight: 80 kg 92.4 kg   General exam: Appears calm and comfortable   Respiratory system: Clear to auscultation. Respiratory effort normal. Cardiovascular system: S1 & S2 heard, RRR. No JVD,  Gastrointestinal system: Abdomen is nondistended, soft and nontender.  Central nervous system: Alert and oriented. No focal neurological deficits. Extremities: Symmetric 5 x 5 power. Skin: No rashes, lesions or ulcers Psychiatry:  Mood & affect appropriate.    Condition at discharge: fair  The results of significant diagnostics from this hospitalization (including imaging, microbiology, ancillary and laboratory) are listed below for reference.   Imaging Studies: DG Chest 2 View  Result Date: 11/19/2022 CLINICAL DATA:  Dyspnea EXAM: CHEST - 2 VIEW COMPARISON:  11/11/2022 FINDINGS: Sternotomy and CABG. Stable cardiomegaly. Bilateral airspace and interstitial opacities. No definite pleural effusion. No pneumothorax. No acute osseous abnormality. IMPRESSION: Cardiomegaly and diffuse pulmonary edema, worsened from 11/11/2022. Electronically Signed   By: Minerva Fester M.D.   On: 11/19/2022 17:34   DG Chest 2 View  Result Date: 11/11/2022 CLINICAL DATA:  Shortness of breath EXAM: CHEST - 2 VIEW COMPARISON:  11/07/2022, CT 08/05/2021, 09/20/2021, chest x-ray 05/15/2021 FINDINGS: Post sternotomy changes. Cardiomegaly. No pleural effusion or pneumothorax. Mild diffuse interstitial opacity which may be secondary to low-grade edema, overall aeration is improved compared to the radiograph from 03/23. IMPRESSION: Cardiomegaly with mild diffuse interstitial opacity which may be secondary to low-grade edema but overall aeration is improved as compared with 11/07/2022. Electronically  Signed   By: Jasmine Pang M.D.   On: 11/11/2022 21:58   ECHOCARDIOGRAM COMPLETE  Result Date: 11/09/2022    ECHOCARDIOGRAM REPORT   Patient Name:   Katherine [name] Date of Exam: 11/09/2022 Medical Rec #:  [id number]          Height:       61.0 in Accession #:    [id number]         Weight:       177.3 lb  Date of Birth:  Jun 16, 1956          BSA:          1.794 m Patient Age:    66 years           BP:           136/69 mmHg Patient Gender: F                  HR:           102 bpm. Exam Location:  Inpatient Procedure: 2D Echo, Cardiac Doppler and Color Doppler Indications:    Chest Pain R07.9  History:        Patient has prior history of Echocardiogram examinations, most                 recent 12/09/2021. CHF, CAD, PAD and COPD, Arrythmias:LBBB,                 Atrial Fibrillation, Tachycardia and Bradycardia,                 Signs/Symptoms:Chest Pain; Risk Factors:Hypertension, Diabetes,                 Dyslipidemia and Sleep Apnea. ESRD.  Sonographer:    Lucendia Herrlich Referring Phys: [id number] JENNIFER CHOI  Sonographer Comments: Image acquisition challenging due to uncooperative patient. IMPRESSIONS  1. Technically difficult study with limited visualization of cardiac structures.  2.     The LV is not well visualized. Overall EF seems to be in the 35-40% range with dyskinesis of the mid to distal inferoseptal wall and entire apex. The anterior wall is never imaged well  3.     Right ventricular systolic function is normal. The right ventricular size is normal.  4. The mitral valve is normal in structure. Mild mitral valve regurgitation. No evidence of mitral stenosis. Moderate mitral annular calcification.  5. The aortic valve is moderately calcified with fusion of the right and left coronary cusps. The aortic valve is tricuspid. There is moderate calcification of the aortic valve. Aortic valve regurgitation is trivial. Mild aortic valve stenosis. Aortic valve area, by VTI measures 1.65 cm. Aortic valve mean gradient measures 9.0 mmHg. Aortic valve Vmax measures 2.13 m/s.  6. The inferior vena cava is normal in size with greater than 50% respiratory variability, suggesting right atrial pressure of 3 mmHg.  7. Left atrial size was moderately dilated.  8. Aortic dilatation noted. There is borderline dilatation of the  aortic root, measuring 38 mm. FINDINGS  Left Ventricle: The LV is not well visualized. Overall EF seems to be in the 35-40% range with dyskinesis of the mid to distal inferoseptal wall and entire apex. The anterior wall is never imaged well. Left ventricular ejection fraction, by estimation, is 35 to 40%. The left ventricle has moderately decreased function. The left ventricle demonstrates regional wall motion abnormalities. The left ventricular internal cavity size was normal in size. There is mild concentric left ventricular hypertrophy.  Left ventricular diastolic parameters are indeterminate. Right Ventricle: The right ventricular size is normal. No increase in right ventricular wall thickness. Right ventricular systolic function is normal. Left Atrium: Left atrial size was moderately dilated. Right Atrium: Right atrial size was normal in size. Pericardium: There is no evidence of pericardial effusion. Mitral Valve: The mitral valve is normal in structure. Moderate mitral annular calcification. Mild mitral valve regurgitation. No evidence of mitral valve stenosis. MV peak gradient, 9.6 mmHg. The mean mitral valve gradient is 4.0 mmHg. Tricuspid Valve: The tricuspid valve is normal in structure. Tricuspid valve regurgitation is trivial. No evidence of tricuspid stenosis. Aortic Valve: The aortic valve is moderately calcified with fusion of the right and left coronary cusps. The aortic valve is tricuspid. There is moderate calcification of the aortic valve. Aortic valve regurgitation is trivial. Mild aortic stenosis is present. Aortic valve mean gradient measures 9.0 mmHg. Aortic valve peak gradient measures 18.1 mmHg. Aortic valve area, by VTI measures 1.65 cm. Pulmonic Valve: The pulmonic valve was normal in structure. Pulmonic valve regurgitation is not visualized. No evidence of pulmonic stenosis. Aorta: Aortic dilatation noted. There is borderline dilatation of the aortic root, measuring 38 mm. Venous: The  inferior vena cava is normal in size with greater than 50% respiratory variability, suggesting right atrial pressure of 3 mmHg. IAS/Shunts: No atrial level shunt detected by color flow Doppler.  LEFT VENTRICLE PLAX 2D LVIDd:         4.60 cm   Diastology LVIDs:         3.40 cm   LV e' lateral:   9.90 cm/s LV PW:         1.10 cm   LV E/e' lateral: 12.4 LV IVS:        1.10 cm LVOT diam:     2.20 cm LV SV:         55 LV SV Index:   31 LVOT Area:     3.80 cm  RIGHT VENTRICLE            IVC RV S prime:     7.71 cm/s  IVC diam: 1.50 cm TAPSE (M-mode): 1.5 cm LEFT ATRIUM           Index        RIGHT ATRIUM           Index LA diam:      4.20 cm 2.34 cm/m   RA Area:     12.90 cm LA Vol (A4C): 87.3 ml 48.65 ml/m  RA Volume:   27.10 ml  15.10 ml/m  AORTIC VALVE AV Area (Vmax):    1.90 cm AV Area (Vmean):   1.81 cm AV Area (VTI):     1.65 cm AV Vmax:           212.67 cm/s AV Vmean:          135.667 cm/s AV VTI:            0.333 m AV Peak Grad:      18.1 mmHg AV Mean Grad:      9.0 mmHg LVOT Vmax:         106.25 cm/s LVOT Vmean:        64.600 cm/s LVOT VTI:          0.144 m LVOT/AV VTI ratio: 0.43  AORTA Ao Root diam: 3.30 cm Ao Asc diam:  3.75 cm MITRAL VALVE  TRICUSPID VALVE MV Area VTI:  1.91 cm      TR Peak grad:   25.4 mmHg MV Peak grad: 9.6 mmHg      TR Vmax:        252.00 cm/s MV Mean grad: 4.0 mmHg MV Vmax:      1.55 m/s      SHUNTS MV Vmean:     91.7 cm/s     Systemic VTI:  0.14 m MV E velocity: 123.00 cm/s  Systemic Diam: 2.20 cm Arvilla Meres MD Electronically signed by Arvilla Meres MD Signature Date/Time: 11/09/2022/5:04:46 PM    Final    DG Chest Port 1 View  Result Date: 11/07/2022 CLINICAL DATA:  Hypoxia EXAM: PORTABLE CHEST 1 VIEW COMPARISON:  11/06/2022 FINDINGS: Cardiac shadow is enlarged but stable. Postsurgical changes are again seen. Lungs demonstrate increased interstitial markings consistent with edema. No sizable effusion is noted. No pneumothorax is seen. No bony  abnormality is noted. IMPRESSION: Increasing interstitial edema. Electronically Signed   By: Alcide Clever M.D.   On: 11/07/2022 22:16   DG Chest Port 1 View  Result Date: 11/06/2022 CLINICAL DATA:  Chest pain and dyspnea EXAM: PORTABLE CHEST 1 VIEW COMPARISON:  02/02/2022 chest radiograph. FINDINGS: Stable mid to lower sternotomy wire discontinuities. CABG clips overlie the mediastinum. Stable cardiomediastinal silhouette with top-normal heart size. No pneumothorax. No pleural effusion. No acute consolidative airspace disease. No overt pulmonary edema. Chronic coarsened interstitial and patchy reticular opacities throughout both lungs. IMPRESSION: No acute cardiopulmonary disease. Chronic coarsened interstitial and patchy reticular opacities throughout both lungs, favor nonspecific scarring versus chronic interstitial lung disease. Electronically Signed   By: Delbert Phenix M.D.   On: 11/06/2022 08:49    Microbiology: Results for orders placed or performed during the hospital encounter of 12/07/21  Respiratory (~20 pathogens) panel by PCR     Status: None   Collection Time: 12/07/21 11:17 PM   Specimen: Nasopharyngeal Swab; Respiratory  Result Value Ref Range Status   Adenovirus NOT DETECTED NOT DETECTED Final   Coronavirus 229E NOT DETECTED NOT DETECTED Final    Comment: (NOTE) The Coronavirus on the Respiratory Panel, DOES NOT test for the novel  Coronavirus (2019 nCoV)    Coronavirus HKU1 NOT DETECTED NOT DETECTED Final   Coronavirus NL63 NOT DETECTED NOT DETECTED Final   Coronavirus OC43 NOT DETECTED NOT DETECTED Final   Metapneumovirus NOT DETECTED NOT DETECTED Final   Rhinovirus / Enterovirus NOT DETECTED NOT DETECTED Final   Influenza A NOT DETECTED NOT DETECTED Final   Influenza B NOT DETECTED NOT DETECTED Final   Parainfluenza Virus 1 NOT DETECTED NOT DETECTED Final   Parainfluenza Virus 2 NOT DETECTED NOT DETECTED Final   Parainfluenza Virus 3 NOT DETECTED NOT DETECTED Final    Parainfluenza Virus 4 NOT DETECTED NOT DETECTED Final   Respiratory Syncytial Virus NOT DETECTED NOT DETECTED Final   Bordetella pertussis NOT DETECTED NOT DETECTED Final   Bordetella Parapertussis NOT DETECTED NOT DETECTED Final   Chlamydophila pneumoniae NOT DETECTED NOT DETECTED Final   Mycoplasma pneumoniae NOT DETECTED NOT DETECTED Final    Comment: Performed at [facility], [address], [place], [place] [address]  Resp Panel by RT-PCR (Flu A&B, Covid) Nasopharyngeal Swab     Status: None   Collection Time: 12/11/21  2:58 PM   Specimen: Nasopharyngeal Swab; Nasopharyngeal(NP) swabs in vial transport medium  Result Value Ref Range Status   SARS Coronavirus 2 by RT PCR NEGATIVE NEGATIVE Final  Comment: (NOTE) SARS-CoV-2 target nucleic acids are NOT DETECTED.  The SARS-CoV-2 RNA is generally detectable in upper respiratory specimens during the acute phase of infection. The lowest concentration of SARS-CoV-2 viral copies this assay can detect is 138 copies/mL. A negative result does not preclude SARS-Cov-2 infection and should not be used as the sole basis for treatment or other patient management decisions. A negative result may occur with  improper specimen collection/handling, submission of specimen other than nasopharyngeal swab, presence of viral mutation(s) within the areas targeted by this assay, and inadequate number of viral copies(<138 copies/mL). A negative result must be combined with clinical observations, patient history, and epidemiological information. The expected result is Negative.  Fact Sheet for Patients:  BloggerCourse.com  Fact Sheet for Healthcare Providers:  SeriousBroker.it  This test is no t yet approved or cleared by the [place] FDA and  has been authorized for detection and/or diagnosis of SARS-CoV-2 by FDA under an Emergency Use Authorization (EUA). This EUA will remain  in effect  (meaning this test can be used) for the duration of the COVID-19 declaration under Section 564(b)(1) of the Act, 21 U.S.C.section 360bbb-3(b)(1), unless the authorization is terminated  or revoked sooner.       Influenza A by PCR NEGATIVE NEGATIVE Final   Influenza B by PCR NEGATIVE NEGATIVE Final    Comment: (NOTE) The Xpert Xpress SARS-CoV-2/FLU/RSV plus assay is intended as an aid in the diagnosis of influenza from Nasopharyngeal swab specimens and should not be used as a sole basis for treatment. Nasal washings and aspirates are unacceptable for Xpert Xpress SARS-CoV-2/FLU/RSV testing.  Fact Sheet for Patients: BloggerCourse.com  Fact Sheet for Healthcare Providers: SeriousBroker.it  This test is not yet approved or cleared by the [place] FDA and has been authorized for detection and/or diagnosis of SARS-CoV-2 by FDA under an Emergency Use Authorization (EUA). This EUA will remain in effect (meaning this test can be used) for the duration of the COVID-19 declaration under Section 564(b)(1) of the Act, 21 U.S.C. section 360bbb-3(b)(1), unless the authorization is terminated or revoked.  Performed at [facility], [address], [place], [place] [address]   MRSA Next Gen by PCR, Nasal     Status: None   Collection Time: 12/12/21 11:50 AM   Specimen: Nasal Mucosa; Nasal Swab  Result Value Ref Range Status   MRSA by PCR Next Gen NOT DETECTED NOT DETECTED Final    Comment: (NOTE) The GeneXpert MRSA Assay (FDA approved for NASAL specimens only), is one component of a comprehensive MRSA colonization surveillance program. It is not intended to diagnose MRSA infection nor to guide or monitor treatment for MRSA infections. Test performance is not FDA approved in patients less than 43 years old. Performed at [facility], [address] [place], [place] [address]    *Note: Due to a large number of results  and/or encounters for the requested time period, some results have not been displayed. A complete set of results can be found in Results Review.    Labs: CBC: Recent Labs  Lab 11/19/22 1511  WBC 5.6  NEUTROABS 4.2  HGB 10.9*  HCT 34.2*  MCV 97.2  PLT 356   Basic Metabolic Panel: Recent Labs  Lab 11/17/22 0701 11/19/22 1511 11/21/22 1043 11/22/22 0211  NA 133* 138 135 136  K 4.0 3.8 3.6 3.5  CL 101 97* 98 99  CO2 21* 28 27 27   GLUCOSE 151* 153* 200* 203*  BUN 15 8  13 14  CREATININE 0.99 1.13* 1.04* 1.01*  CALCIUM 8.7* 9.1 8.8* 8.5*  MG 2.0  --   --   --   PHOS 4.5  --   --   --    Liver Function Tests: Recent Labs  Lab 11/17/22 0701 11/19/22 1511  AST  --  21  ALT  --  14  ALKPHOS  --  98  BILITOT  --  0.8  PROT  --  7.4  ALBUMIN 2.4* 2.9*   CBG: Recent Labs  Lab 11/22/22 1646 11/22/22 1654 11/22/22 1739 11/22/22 2117 11/23/22 0741  GLUCAP 53* 72 82 238* 135*    Discharge time spent: 45 minutes.   Signed: Kathlen Mody, MD [facility] 11/23/2022

## 2022-11-23 NOTE — H&P (Signed)
History and Physical    Katherine [name] ZOX:[id number] DOB: [birth year] DOA: 11/23/2022  PCP: [name], MD Patient coming from: [facility]  Chief Complaint: SOB  HPI: Katherine [name] is a 67 y.o. female with medical history significant of chronic hypoxia on 5-6 liters nasal cannula, systolic CHF EF 35%, CAD status post CABG, history of renal transplant 1991 now CKD stage IIIa, paroxysmal A-fib on Eliquis, HTN, HLD, GERD, DM 2, depression/anxiety returns back from [facility] for evaluation of shortness of breath.  She was admitted from 3/27 - 11/23/2022 for treatment of shortness of breath facility secondary to CHF exacerbation treated with diuretics.  Hospital course was complicated by AKI but stable on day of discharge.  After returning to the facility it was noted their oxygen tank could only go up to 4 L, they tried to use another patient's oxygen which went up to 5 L but despite of using that she continued to get short of breath and eventually noted to be hypoxic.  EMS brought her back to the hospital on 6 L nasal cannula but ultimately had to be placed on nonrebreather at 10 L.  No evidence of infection was noted, her BNP was elevated in the ED.  She was given IV Lasix and medical team was requested to admit the patient.  Routine blood work was overall unremarkable besides elevated troponin 993 and chest x-ray suggestive of pulmonary vascular congestion.  Review of Systems: As per HPI otherwise 10 point review of systems negative.  Review of Systems Otherwise negative except as per HPI, including: General: Denies fever, chills, night sweats or unintended weight loss. Resp: Denies hemoptysis Cardiac: Denies chest pain, palpitations, orthopnea, paroxysmal nocturnal dyspnea. GI: Denies abdominal pain, nausea, vomiting, diarrhea or constipation GU: Denies dysuria, frequency, hesitancy or incontinence MS: Denies muscle aches, joint pain or swelling Neuro: Denies headache, neurologic  deficits (focal weakness, numbness, tingling), abnormal gait Psych: Denies anxiety, depression, SI/HI/AVH Skin: Denies new rashes or lesions ID: Denies sick contacts, exotic exposures, travel  Past Medical History:  Diagnosis Date   Anemia    Anxiety    Bilateral carotid artery stenosis    Carotid duplex 03/1190: 1-39% LICA, 60-79% RICA, >50% RECA, f/u 1 yr suggested   CAD (coronary artery disease) of bypass graft 5/01; 3/'02, 8/'03, 10/'04; 1/15   PCI x 5 to SVG-D1    CAD in native artery 07/1993   3 Vessel Disease (LAD-D1 & RCA) -- CABG (Dx in setting of inferior STEMI-PTCA of RCA)   COPD mixed type    Followed by Dr. Delton Coombes "pulmonologist said no COPD"   Depression with anxiety    Diabetes mellitus type 2 in obese    Diarrhea    started after cholecystectomy and mass removed from intestine   Dyslipidemia, goal LDL below 70    08/2012: TC 137, TG 200, HDL 32!, LDL 45; on statin (followed by Dr.Deterding)   ESRD (end stage renal disease) 1991   s/p Cadaveric Renal Transplant [facility] - Dr. 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  Had having having lots of nausea and vomiting with poor p.o. intake.  Thought to have QT prolongation with multiple medications involved and hypomagnesemia, hypokalemia.  Tikosyn discontinued along with Zoloft and Phenergan.   Hypertension associated with diabetes    Mild aortic stenosis by prior echocardiogram 07/2019   Echo:  Mild aortic stenosis (gradients: Mean 14.3 mmHg -peak 24.9 mmHg).   Morbid obesity    MRSA (methicillin resistant staph aureus) culture  positive    OSA (obstructive sleep apnea)    no longer on CPAP or home O2, states she doesn't need now after lap band   PAD (peripheral artery disease) 08/2013   LEA Dopplers to be read by Dr. Kirke Corin   PAF (paroxysmal atrial fibrillation) 06/2014   Noted on CardioNet Monitor  - --> rhythm control with Tikosyn (Dr. Johney Frame); converted from warfarin to apixaban for anticoagulation.   Pneumonia    Recurrent boils    Bilateral Groin   Rheumatoid arthritis    Per Patient Report; associated with OA   S/p cadaver renal transplant 1991   [facility]    Past Surgical History:  Procedure Laterality Date   ABDOMINAL AORTOGRAM N/A 04/21/2018   Procedure: ABDOMINAL AORTOGRAM;  Surgeon: Marykay Lex, MD;  Location: [facility] INVASIVE CV LAB;  Service: Cardiovascular;  Laterality: N/A;   CATHETER REMOVAL     CHOLECYSTECTOMY N/A 10/29/2014   Procedure: LAPAROSCOPIC CHOLECYSTECTOMY WITH INTRAOPERATIVE CHOLANGIOGRAM;  Surgeon: Glenna Fellows, MD;  Location: [facility] ORS;  Service: General;  Laterality: N/A;   CORONARY ANGIOPLASTY  1994   x5   CORONARY ARTERY BYPASS GRAFT  1995   LIMA-LAD, SVG-RPDA, SVG-D1   CORONARY PRESSURE/FFR STUDY N/A 02/20/2021   Procedure: INTRAVASCULAR PRESSURE WIRE/FFR STUDY;  Surgeon: Marykay Lex, MD;  Location: [facility] INVASIVE CV LAB;  Service: Cardiovascular;  Laterality: N/A;   CORONARY STENT INTERVENTION N/A 02/20/2021   Procedure: PERCUTANEOUS CORONARY STENT INTERVENTION;  Surgeon: Marykay Lex, MD;  Location: [facility] INVASIVE CV LAB; ostLCx 60% (Neg RFR 0.96);; SVG- D2 recurrent 90% ISR & 95% native D2 after graft-> DES PCI of 95% anastomotic D2 lesion (Onyx Frontier 2.25 mm x 12 mm => 2.75 mm @ overlap, 2.5 distal.);PTCA of ISR in body of graft. ->  2.5 mm scoring balloon & post-dil w/ 2.75 mm Spencer balloon   ESOPHAGOGASTRODUODENOSCOPY N/A 10/15/2016   Procedure: ESOPHAGOGASTRODUODENOSCOPY (EGD);  Surgeon: Charlott Rakes, MD;  Location: [facility] ENDOSCOPY;  Service: Endoscopy;  Laterality:  N/A;   I & D EXTREMITY Right 01/29/2018   Procedure: IRRIGATION AND DEBRIDEMENT THUMB;  Surgeon: Knute Neu, MD;  Location: [facility] OR;  Service: Plastics;  Laterality: Right;   INCISE AND DRAIN ABCESS     KIDNEY TRANSPLANT  1991   KNEE ARTHROSCOPY WITH LATERAL MENISECTOMY Left 12/03/2017   Procedure: LEFT KNEE ARTHROSCOPY WITH LATERAL MENISECTOMY;  Surgeon: Frederico Hamman, MD;  Location: [facility] OR;  Service: Orthopedics;  Laterality: Left;   LAPAROSCOPIC GASTRIC BANDING  04/2004; 10/'09, 2/'10   Port Replacement x 2   LEFT HEART CATH AND CORONARY ANGIOGRAPHY N/A 02/20/2021   Procedure: LEFT HEART CATH AND CORONARY ANGIOGRAPHY;  Surgeon: Marykay Lex, MD;  Location: [facility] INVASIVE CV LAB;  Service: Cardiovascular;  Laterality: N/A;   LEFT HEART CATH AND CORS/GRAFTS ANGIOGRAPHY N/A 04/21/2018   Procedure: LEFT HEART CATH AND CORS/GRAFTS ANGIOGRAPHY;  Surgeon: Marykay Lex, MD;  Location: [facility] INVASIVE CV LAB;  Ost-Prox LAD 50% - proxLAD (pre & post D1) 100% CTO. Cx - patent, small OM1 (stable ~ ostial  OM1 90%, too small for PCI) & 2 small LPL; Ost-distal RCA 100% CTO.  LIMA-LAD (not injected); SVG-dRCA patent, SVG-D1 - insertion stent ~20% ISR - Severe R CFA disease w/ focal Sub TO   LEFT HEART CATH AND CORS/GRAFTS ANGIOGRAPHY  5/'01, 3/'02, 8/'03, 10/'04; 1/'15   08/22/2013: LAD & RCA 100%; LIMA-LAD & SVG-rPDA patent; Cx-- OM1 60%, OM2 ostial ~50%; SVG-D1 - 80% mid, 50% distal ISR --PCI   LEFT HEART CATH AND CORS/GRAFTS ANGIOGRAPHY N/A 01/31/2021   Procedure: LEFT HEART CATH AND CORS/GRAFTS ANGIOGRAPHY;  Surgeon: Dolores Patty, MD;  Location: [facility] INVASIVE CV LAB;;   LEFT HEART CATH AND CORS/GRAFTS ANGIOGRAPHY N/A 09/05/2021   Procedure: LEFT HEART CATH AND CORS/GRAFTS ANGIOGRAPHY;  Surgeon: Marykay Lex, MD;  Location: [facility] INVASIVE CV LAB;  Service: Cardiovascular;  Laterality: N/A;   LEFT HEART CATH AND CORS/GRAFTS ANGIOGRAPHY N/A 08/21/2021   Procedure: LEFT HEART CATH AND CORS/GRAFTS  ANGIOGRAPHY;  Surgeon: Lyn Records, MD;  Location: [facility] INVASIVE CV LAB;  Service: Cardiovascular;  Laterality: N/A;   LEFT HEART CATHETERIZATION WITH CORONARY/GRAFT ANGIOGRAM N/A 08/23/2013   Procedure: LEFT HEART CATHETERIZATION WITH Isabel Caprice;  Surgeon: Iran Ouch, MD;  Location: [facility] CATH LAB;  Service: Cardiovascular;  Laterality: N/A;   Lower Extremity Arterial Dopplers  08/2013   ABI: R 0.96, L 1.04   MULTIPLE TOOTH EXTRACTIONS  age 51   NM MYOVIEW LTD  03/2016   EF 62%. LOW RISK. C/W prior MI - no Ischemia. Apical hypokinesis.   PERCUTANEOUS CORONARY STENT INTERVENTION (PCI-S)  5/'01, 3/'02, 8/'03, 10/'04;   '01 - S660 BMS 2.5 x 9 - dSVG-D1 into D1; '02- post-stent stenosis - 2.5 x 8 Pixel BMS; '8\03: ISR/Thrombosis into native D1 - AngioJet, 2.5 x 13 Pixel; '04 - ISR 95% - covered stented area with Taxus DES 2.5 mm x 20 (2.88)   PERCUTANEOUS CORONARY STENT INTERVENTION (PCI-S)  08/23/2013   Procedure: PERCUTANEOUS CORONARY STENT INTERVENTION (PCI-S);  Surgeon: Iran Ouch, MD;  Location: [facility] CATH LAB;  Service: Cardiovascular;;mid SVG-D1 80%; distal stent ~50% ISR; Promus Prermier DES 2.75 mm xc 20 mm (2.8 mm)   PORT-A-CATH REMOVAL     kidney   TRANSTHORACIC ECHOCARDIOGRAM  07/2019   a) 07/2019: EF 55 to 60%.  No LVH.  Paradoxical septal WM-s/p CABG.  GRII DD.  Nl RV size and fxn.  Mild bilateral atrial dilation.  Mod MAC.  Trace MR.  Mild AS (gradients: Mean 14.3 mmHg -peak 24.9 mmHg).; B) 06/2020: EF 40 to 45%.  Moderate concentric LVH.  GRII DD.  Elevated LAP.  Mod HK mid Apical Ant-AntSept wall & mild Apical Dyskinesis.  Mod LA dilation.  Mild MR.  AoV sclerosis w/o AS.   TRANSTHORACIC ECHOCARDIOGRAM  01/30/2021   EF 55 to 60%.  Mild LVH.  GR 1 DD.  Elevated LAP.  Moderate LA dilation.  Mild MR with mild MS.  Mild aortic valve stenosis.:   TUBAL LIGATION     wrist fistula repair Left    dialysis for one year    SOCIAL HISTORY:  reports that she quit  smoking about 20 years ago. Her smoking use included cigarettes. She has a 30.00 pack-year smoking history. She has never used smokeless tobacco. She reports that she does not drink alcohol and does not use drugs.  Allergies  Allergen Reactions   Tetracycline Hives    Patient tolerated Doxycycline Dec 2020   Niacin Other (See Comments)    Mouth blisters  Niaspan [Niacin Er] Other (See Comments)    Mouth blisters   Sulfa Antibiotics Nausea Only and Other (See Comments)    "Tears up stomach"   Sulfonamide Derivatives Other (See Comments)    Reaction: per patient "tears her stomach up"   Codeine Nausea And Vomiting   Erythromycin Nausea And Vomiting   Hydromorphone Hcl Nausea And Vomiting   Morphine And Related Nausea And Vomiting   Nalbuphine Nausea And Vomiting    Nubain   Sulfasalazine Nausea Only and Other (See Comments)    per patient "tears her stomach up", "Tears up stomach"   Tape Rash and Other (See Comments)    No "plastic" tape," please----cloth tape only    FAMILY HISTORY: Family History  Problem Relation Age of Onset   Cancer Mother        liver   Heart disease Father    Cancer Father        colon   Arrhythmia Brother        Atrial Fibrillation   Arrhythmia Paternal Aunt        Atrial Fibrillation     Prior to Admission medications   Medication Sig Start Date End Date Taking? Authorizing Provider  albuterol (VENTOLIN HFA) 108 (90 Base) MCG/ACT inhaler TAKE 2 PUFFS BY MOUTH EVERY 6 HOURS AS NEEDED FOR WHEEZE OR SHORTNESS OF BREATH Patient taking differently: Inhale 2 puffs into the lungs every 6 (six) hours as needed for shortness of breath. 07/30/22   Leslye PeerByrum, Robert S, MD  arformoterol (BROVANA) 15 MCG/2ML NEBU Take 2 mLs (15 mcg total) by nebulization 2 (two) times daily. 08/30/19   Leslye PeerByrum, Robert S, MD  azaTHIOprine (IMURAN) 50 MG tablet Take 2 & 1/2 tablets (125 mg) by mouth daily at 3pm Patient taking differently: Take 125 mg by mouth daily. 10/21/21    Pokhrel, Rebekah ChesterfieldLaxman, MD  bisacodyl (DULCOLAX) 5 MG EC tablet Take 1 tablet (5 mg total) by mouth daily as needed for moderate constipation. 10/05/21   Drema DallasWoods, Curtis J, MD  budesonide (PULMICORT) 0.5 MG/2ML nebulizer solution Take 2 mLs (0.5 mg total) by nebulization 2 (two) times daily. Patient taking differently: Take 2 mLs by nebulization 2 (two) times daily as needed (wheezing). 08/24/19   Etta GrandchildJones, Thomas L, MD  calcitRIOL (ROCALTROL) 0.25 MCG capsule Take 0.25 mcg by mouth See admin instructions. Takes every 3 days.    [provider]  clopidogrel (PLAVIX) 75 MG tablet Take 1 tablet (75 mg total) by mouth daily. 11/11/22 01/10/23  Noralee Stainhoi, Jennifer, DO  Continuous Blood Gluc Receiver (FREESTYLE LIBRE 2 READER) DEVI 1 Act by Does not apply route daily. 06/17/22   Etta GrandchildJones, Thomas L, MD  Continuous Blood Gluc Sensor (FREESTYLE LIBRE 2 SENSOR) MISC 1 Act by Does not apply route daily. 06/17/22   Etta GrandchildJones, Thomas L, MD  [employer] VITAMIN C 500 MG tablet TAKE 1 TABLET (500 MG TOTAL) BY MOUTH DAILY. Patient taking differently: Take 500 mg by mouth daily. 02/03/22   Etta GrandchildJones, Thomas L, MD  cyclobenzaprine (FLEXERIL) 10 MG tablet TAKE 1 TABLET BY MOUTH TWICE A DAY AS NEEDED FOR MUSCLE SPASMS Patient taking differently: Take 10 mg by mouth daily as needed for muscle spasms. 09/14/22   Etta GrandchildJones, Thomas L, MD  ELIQUIS 5 MG TABS tablet TAKE 1 TABLET BY MOUTH TWICE A DAY 10/22/22   Etta GrandchildJones, Thomas L, MD  empagliflozin (JARDIANCE) 10 MG TABS tablet Take 1 tablet (10 mg total) by mouth daily. 11/11/22 01/10/23  Noralee Stainhoi, Jennifer, DO  Ensure Max  Protein (ENSURE MAX PROTEIN) LIQD Take 330 mLs (11 oz total) by mouth 2 (two) times daily. 12/12/21   Narda Bonds, MD  fluticasone (FLONASE) 50 MCG/ACT nasal spray PLACE 2 SPRAYS INTO BOTH NOSTRILS 2 TIMES DAILY. 08/04/22   [name], MD  furosemide (LASIX) 40 MG tablet Take 1 tablet (40 mg total) by mouth daily. 11/23/22   Kathlen Mody, MD  gabapentin (NEURONTIN) 100 MG capsule Take 1 capsule  (100 mg total) by mouth at bedtime. Patient taking differently: Take 100 mg by mouth daily as needed (For pain). 09/11/21   Kathlen Mody, MD  guaiFENesin-dextromethorphan (ROBITUSSIN DM) 100-10 MG/5ML syrup Take 15 mLs by mouth every 4 (four) hours as needed for cough. 11/23/22   Kathlen Mody, MD  hydrocortisone (ANUSOL-HC) 25 MG suppository Place 1 suppository (25 mg total) rectally 2 (two) times daily. 11/23/22   Kathlen Mody, MD  insulin glargine-yfgn (SEMGLEE) 100 UNIT/ML injection Inject 0.08 mLs (8 Units total) into the skin daily. 11/24/22   Kathlen Mody, MD  Insulin Pen Needle (PEN NEEDLES) 32G X 4 MM MISC Use as needed to administer insulin. E11.9 02/01/22   [name], MD  isosorbide mononitrate (IMDUR) 30 MG 24 hr tablet Take 1 tablet (30 mg total) by mouth daily. 11/12/22   Almon Hercules, MD  KLOR-CON M10 10 MEQ tablet TAKE 1 TABLET BY MOUTH 2 TIMES DAILY. 09/19/22   [name], MD  lamoTRIgine (LAMICTAL) 25 MG tablet TAKE 1 TABLET BY MOUTH EVERYDAY AT BEDTIME Patient taking differently: Take 25 mg by mouth daily. 02/19/22   Ranelle Oyster, MD  LORazepam (ATIVAN) 1 MG tablet Take 1 tablet (1 mg total) by mouth every 8 (eight) hours as needed for up to 3 days for anxiety. 11/23/22 11/26/22  Kathlen Mody, MD  metFORMIN (GLUCOPHAGE) 500 MG tablet Take 500 mg by mouth 2 (two) times daily. 12/08/21   [provider]  metoprolol tartrate (LOPRESSOR) 50 MG tablet Take 1 tablet (50 mg total) by mouth 2 (two) times daily. 11/10/22 01/09/23  Noralee Stain, DO  montelukast (SINGULAIR) 10 MG tablet TAKE 1 TABLET BY MOUTH EVERYDAY AT BEDTIME Patient taking differently: Take 10 mg by mouth at bedtime. 05/04/22   Leslye Peer, MD  Multiple Vitamin (MULTIVITAMIN WITH MINERALS) TABS tablet Take 1 tablet by mouth daily. 12/13/21   Narda Bonds, MD  nitroGLYCERIN (NITROSTAT) 0.4 MG SL tablet PLACE 1 TAB UNDER TONGUE EVERY FOR CHEST PAIN Patient taking differently: Place 0.4 mg under the  tongue every 5 (five) minutes as needed for chest pain. 06/30/22   Marykay Lex, MD  Oxycodone HCl 10 MG TABS Take 1 tablet (10 mg total) by mouth every 8 (eight) hours as needed for up to 3 days. 11/23/22 11/26/22  Kathlen Mody, MD  pantoprazole (PROTONIX) 40 MG tablet TAKE 1 TABLET BY MOUTH EVERY DAY 08/05/22   [name], MD  predniSONE (DELTASONE) 5 MG tablet Take 5 mg by mouth daily with breakfast.    [provider]  promethazine (PHENERGAN) 25 MG tablet Take 1 tablet (25 mg total) by mouth every 6 (six) hours as needed for nausea or vomiting. 05/20/22   Loyola Mast, MD  ranolazine (RANEXA) 1000 MG SR tablet TAKE 1 TABLET BY MOUTH TWICE A DAY 10/31/22   [name], MD  rosuvastatin (CRESTOR) 20 MG tablet TAKE 1 TABLET BY MOUTH EVERY DAY 07/29/22   Marykay Lex, MD  sertraline (ZOLOFT) 100 MG  tablet Take 2 tablets (200 mg total) by mouth daily. 06/16/22   [name], MD  witch hazel-glycerin (TUCKS) pad Apply topically as needed for itching. 11/23/22   Kathlen Mody, MD    Physical Exam: Vitals:   11/23/22 1900 11/23/22 1913 11/23/22 2028 11/23/22 2029  BP: 119/78  117/76   Pulse: 77  79   Resp: 18 18 19    Temp:  98.2 F (36.8 C)    TempSrc:  Oral    SpO2: 92% 94% 93% 92%  Weight:      Height:          Constitutional: NAD, calm, comfortable; 8L [place] Eyes: PERRL, lids and conjunctivae normal ENMT: Mucous membranes are moist. Posterior pharynx clear of any exudate or lesions.Normal dentition.  Neck: normal, supple, no masses, no thyromegaly Respiratory: bibasilar crackles.   Cardiovascular: Regular rate and rhythm, no murmurs / rubs / gallops. No extremity edema. 2+ pedal pulses. No carotid bruits.  Abdomen: no tenderness, no masses palpated. No hepatosplenomegaly. Bowel sounds positive.  Musculoskeletal: no clubbing / cyanosis. No joint deformity upper and lower extremities. Good ROM, no contractures. Normal muscle tone.  Skin: no rashes, lesions,  ulcers. No induration Neurologic: CN 2-12 grossly intact. Sensation intact, DTR normal. Strength 5/5 in all 4.  Psychiatric: Normal judgment and insight. Alert and oriented x 3. Normal mood.     Labs on Admission: I have personally reviewed following labs and imaging studies  CBC: Recent Labs  Lab 11/19/22 1511 11/23/22 1930  WBC 5.6 5.7  NEUTROABS 4.2 4.1  HGB 10.9* 9.1*  HCT 34.2* 28.7*  MCV 97.2 94.4  PLT 356 339   Basic Metabolic Panel: Recent Labs  Lab 11/17/22 0701 11/19/22 1511 11/21/22 1043 11/22/22 0211 11/23/22 1930  NA 133* 138 135 136 134*  K 4.0 3.8 3.6 3.5 4.5  CL 101 97* 98 99 96*  CO2 21* 28 27 27 26   GLUCOSE 151* 153* 200* 203* 203*  BUN 15 8 13 14 16   CREATININE 0.99 1.13* 1.04* 1.01* 1.08*  CALCIUM 8.7* 9.1 8.8* 8.5* 9.0  MG 2.0  --   --   --   --   PHOS 4.5  --   --   --   --    GFR: Estimated Creatinine Clearance: 49.3 mL/min (A) (by C-G formula based on SCr of 1.08 mg/dL (H)). Liver Function Tests: Recent Labs  Lab 11/17/22 0701 11/19/22 1511  AST  --  21  ALT  --  14  ALKPHOS  --  98  BILITOT  --  0.8  PROT  --  7.4  ALBUMIN 2.4* 2.9*   No results for input(s): "LIPASE", "AMYLASE" in the last 168 hours. No results for input(s): "AMMONIA" in the last 168 hours. Coagulation Profile: No results for input(s): "INR", "PROTIME" in the last 168 hours. Cardiac Enzymes: No results for input(s): "CKTOTAL", "CKMB", "CKMBINDEX", "TROPONINI" in the last 168 hours. BNP (last 3 results) No results for input(s): "PROBNP" in the last 8760 hours. HbA1C: No results for input(s): "HGBA1C" in the last 72 hours. CBG: Recent Labs  Lab 11/22/22 1654 11/22/22 1739 11/22/22 2117 11/23/22 0741 11/23/22 1136  GLUCAP 72 82 238* 135* 191*   Lipid Profile: No results for input(s): "CHOL", "HDL", "LDLCALC", "TRIG", "CHOLHDL", "LDLDIRECT" in the last 72 hours. Thyroid Function Tests: No results for input(s): "TSH", "T4TOTAL", "FREET4", "T3FREE",  "THYROIDAB" in the last 72 hours. Anemia Panel: No results for input(s): "VITAMINB12", "FOLATE", "FERRITIN", "TIBC", "IRON", "RETICCTPCT" in  the last 72 hours. Urine analysis:    Component Value Date/Time   COLORURINE YELLOW 11/20/2022 1834   APPEARANCEUR HAZY (A) 11/20/2022 1834   LABSPEC 1.014 11/20/2022 1834   PHURINE 6.0 11/20/2022 1834   GLUCOSEU >=500 (A) 11/20/2022 1834   GLUCOSEU 250 (A) 06/16/2022 1559   HGBUR NEGATIVE 11/20/2022 1834   BILIRUBINUR NEGATIVE 11/20/2022 1834   KETONESUR NEGATIVE 11/20/2022 1834   PROTEINUR NEGATIVE 11/20/2022 1834   UROBILINOGEN 0.2 06/16/2022 1559   NITRITE NEGATIVE 11/20/2022 1834   LEUKOCYTESUR NEGATIVE 11/20/2022 1834   Sepsis Labs: !!!!!!!!!!!!!!!!!!!!!!!!!!!!!!!!!!!!!!!!!!!! @LABRCNTIP (procalcitonin:4,lacticidven:4) )No results found for this or any previous visit (from the past 240 hour(s)).   Radiological Exams on Admission: DG Chest Portable 1 View  Result Date: 11/23/2022 CLINICAL DATA:  Shortness of breath EXAM: PORTABLE CHEST 1 VIEW COMPARISON:  11/19/2022, CT 08/05/2021, chest x-ray 12/07/2021 FINDINGS: Post sternotomy changes. Cardiomegaly.diffuse reticular opacities bilaterally consistent with underlying chronic lung disease. Slight increased hazy pulmonary density may reflect superimposed edema or inflammatory process. No pleural effusion or pneumothorax. Aortic atherosclerosis IMPRESSION: Mild diffuse reticular opacities suspicious for underlying chronic lung disease. Cardiomegaly with slight increased hazy pulmonary density may reflect superimposed edema or inflammatory process. Electronically Signed   By: Jasmine Pang M.D.   On: 11/23/2022 19:03     All images have been reviewed by me personally.  EKG: Independently reviewed. A fib, chronic LBBB  Assessment/Plan Principal Problem:   Acute respiratory distress Active Problems:   CAD (coronary artery disease) of bypass graft   Paroxysmal atrial fibrillation (HCC);  CHA2DS2VASc score F, HTN, CAD, CVA = 5   Type 2 diabetes mellitus with hyperlipidemia   Renal transplant disorder   Dyslipidemia, goal LDL below 70   CAD S/P percutaneous coronary angioplasty - PCI x 5 to SVG-D1   Essential hypertension   PAD (peripheral artery disease) (HCC)   COPD (chronic obstructive pulmonary disease)   Chronic combined systolic and diastolic heart failure   CHF exacerbation    Acute On Chronic hypoxia with Resp Distress Acute congestive heart failure reduced ejection fraction, EF 35%.  Class IV -Admit patient to the STEMI home, requiring 8-10 L nasal cannula.  At baseline 5-6 L nasal cannula.  Relatively elevated BNP 993 compared to 300-700 range in the past.  Chest x-ray clinical evidence of volume overload.  Will place her on Lasix 40 mg IV twice daily, fluid restriction.  Monitor and optimize electrolytes.  Echocardiogram in March 2024 showed EF of 35%. -Continue Jardiance, Imdur, Lopressor twice daily  CAD status post CABG - Currently chest pain-free.  Will trend troponin, EKG shows A-fib with LBBB (not new).  Continue home Plavix, Ranexa, metoprolol, Crestor and Imdur. Prn Nitro  History of COPD with chronic hypoxia - Continue home bronchodilators, scheduled and as needed.  History of CKD stage IIIa History of renal transplant in 1991 -Continue home Imuran and prednisone  Diabetes mellitus type 2, insulin-dependent -Semglee 8 units daily, sliding scale and Accu-Chek.  Paroxysmal atrial fibrillation - Currently rate controlled.  Continue metoprolol and Eliquis  History of chronic pain - On oxycodone (reduced to 5mg  for now) and gabapentin  Anxiety/depression -Zoloft and Lamictal.  As needed Xanax reduced to 0.5mg  until her SOB improves  Stage II sacral pressure ulcer, POA Left forearm mild skin tear, POA   DVT prophylaxis: On Eliquis Code Status: DNR Family Communication: None Consults called: None Admission status: Admit to  Progressive  Status is: Inpatient Remains inpatient appropriate because: Severe Resp distress due to Vol  Overload, Anticipate will need about 2 days of IV Diuretics.    Time Spent: 65 minutes.  >50% of the time was devoted to discussing the patients care, assessment, plan and disposition with other care givers along with counseling the patient about the risks and benefits of treatment.    Reshaun Briseno Joline Maxcy MD [facility]  If 7PM-7AM, please contact night-coverage   11/23/2022, 10:02 PM

## 2022-11-23 NOTE — ED Notes (Signed)
RN contacted RT to assess pt.   Pt is on phone with niece updating care on pt.

## 2022-11-23 NOTE — ED Notes (Signed)
Sat decreased to 88% on 6L, O2 increased back to 8L
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
## 2022-11-23 NOTE — TOC Transition Note (Signed)
Transition of Care [facility]) - CM/SW Discharge Note   Patient Details  Name: Katherine [name] MRN: [id number] Date of Birth: 02/06/1956  Transition of Care [facility]) CM/SW Contact:  Delilah Shan, LCSWA Phone Number: 11/23/2022, 11:32 AM   Clinical Narrative:     Patient will DC to: [facility]  Anticipated DC date: 11/23/2022  Family notified: [name]  Transport by: Sharin Mons  ?  Per MD patient ready for DC to [facility] . RN, patient, patient's family, and facility notified of DC. Discharge Summary sent to facility. RN given number for report tele# [phone] RM# 100B. DC packet on chart. DNR signed by MD attached to patients DC packet.Ambulance transport requested for patient.  CSW signing off.    Final next level of care: Skilled Nursing Facility Barriers to Discharge: No Barriers Identified   Patient Goals and CMS Choice   Choice offered to / list presented to : Patient  Discharge Placement                Patient chooses bed at:  [facility]) Patient to be transferred to facility by: [facility] Name of family member notified: [name] Patient and family notified of of transfer: 11/23/22  Discharge Plan and Services Additional resources added to the After Visit Summary for                                       Social Determinants of Health (SDOH) Interventions SDOH Screenings   Food Insecurity: No Food Insecurity (11/12/2022)  Housing: Low Risk  (11/12/2022)  Transportation Needs: Unmet Transportation Needs (11/12/2022)  Utilities: At Risk (11/12/2022)  Alcohol Screen: Low Risk  (12/10/2021)  Depression (PHQ2-9): Medium Risk (02/02/2022)  Tobacco Use: Medium Risk (11/12/2022)     Readmission Risk Interventions    11/09/2022    2:22 PM 12/12/2021   10:01 AM 09/23/2021    4:39 PM  Readmission Risk Prevention Plan  Transportation Screening Complete Complete Complete  Medication Review (RN Care Manager) Referral to Pharmacy Complete Complete  PCP or Specialist  appointment within 3-5 days of discharge  Complete Complete  HRI or Home Care Consult Complete Complete Complete  SW Recovery Care/Counseling Consult Complete Complete Complete  Palliative Care Screening Not Applicable Not Applicable Complete  Skilled Nursing Facility Patient Refused Not Applicable Not Applicable

## 2022-11-24 ENCOUNTER — Inpatient Hospital Stay (HOSPITAL_COMMUNITY): Payer: Medicare Other

## 2022-11-24 ENCOUNTER — Ambulatory Visit: Payer: Medicaid Other | Admitting: Internal Medicine

## 2022-11-24 DIAGNOSIS — I10 Essential (primary) hypertension: Secondary | ICD-10-CM

## 2022-11-24 DIAGNOSIS — I2510 Atherosclerotic heart disease of native coronary artery without angina pectoris: Secondary | ICD-10-CM

## 2022-11-24 DIAGNOSIS — I480 Paroxysmal atrial fibrillation: Secondary | ICD-10-CM

## 2022-11-24 DIAGNOSIS — E785 Hyperlipidemia, unspecified: Secondary | ICD-10-CM

## 2022-11-24 DIAGNOSIS — I5042 Chronic combined systolic (congestive) and diastolic (congestive) heart failure: Secondary | ICD-10-CM

## 2022-11-24 DIAGNOSIS — E1169 Type 2 diabetes mellitus with other specified complication: Secondary | ICD-10-CM

## 2022-11-24 DIAGNOSIS — Z9861 Coronary angioplasty status: Secondary | ICD-10-CM

## 2022-11-24 LAB — BASIC METABOLIC PANEL
Anion gap: 13 (ref 5–15)
Anion gap: 14 (ref 5–15)
BUN: 13 mg/dL (ref 8–23)
BUN: 15 mg/dL (ref 8–23)
CO2: 23 mmol/L (ref 22–32)
CO2: 29 mmol/L (ref 22–32)
Calcium: 8.8 mg/dL — ABNORMAL LOW (ref 8.9–10.3)
Calcium: 8.9 mg/dL (ref 8.9–10.3)
Chloride: 93 mmol/L — ABNORMAL LOW (ref 98–111)
Chloride: 99 mmol/L (ref 98–111)
Creatinine, Ser: 1.07 mg/dL — ABNORMAL HIGH (ref 0.44–1.00)
Creatinine, Ser: 1.14 mg/dL — ABNORMAL HIGH (ref 0.44–1.00)
GFR, Estimated: 53 mL/min — ABNORMAL LOW (ref >60)
GFR, Estimated: 58 mL/min — ABNORMAL LOW (ref >60)
Glucose, Bld: 153 mg/dL — ABNORMAL HIGH (ref 70–99)
Glucose, Bld: 290 mg/dL — ABNORMAL HIGH (ref 70–99)
Potassium: 3.9 mmol/L (ref 3.5–5.1)
Potassium: 3.9 mmol/L (ref 3.5–5.1)
Sodium: 135 mmol/L (ref 135–145)
Sodium: 136 mmol/L (ref 135–145)

## 2022-11-24 LAB — CBC
HCT: 31 % — ABNORMAL LOW (ref 36.0–46.0)
Hemoglobin: 9.4 g/dL — ABNORMAL LOW (ref 12.0–15.0)
MCH: 30 pg (ref 26.0–34.0)
MCHC: 30.3 g/dL (ref 30.0–36.0)
MCV: 99 fL (ref 80.0–100.0)
Platelets: 361 10*3/uL (ref 150–400)
RBC: 3.13 MIL/uL — ABNORMAL LOW (ref 3.87–5.11)
RDW: 19 % — ABNORMAL HIGH (ref 11.5–15.5)
WBC: 5.6 10*3/uL (ref 4.0–10.5)
nRBC: 0.4 % — ABNORMAL HIGH (ref 0.0–0.2)

## 2022-11-24 LAB — GLUCOSE, CAPILLARY
Glucose-Capillary: 183 mg/dL — ABNORMAL HIGH (ref 70–99)
Glucose-Capillary: 155 mg/dL — ABNORMAL HIGH (ref 70–99)

## 2022-11-24 LAB — CBG MONITORING, ED
Glucose-Capillary: 151 mg/dL — ABNORMAL HIGH (ref 70–99)
Glucose-Capillary: 186 mg/dL — ABNORMAL HIGH (ref 70–99)
Glucose-Capillary: 262 mg/dL — ABNORMAL HIGH (ref 70–99)

## 2022-11-24 LAB — SARS CORONAVIRUS 2 BY RT PCR: SARS Coronavirus 2 by RT PCR: NEGATIVE

## 2022-11-24 LAB — MAGNESIUM: Magnesium: 2 mg/dL (ref 1.7–2.4)

## 2022-11-24 LAB — PROCALCITONIN: Procalcitonin: <0.1 ng/mL

## 2022-11-24 MED ORDER — INSULIN ASPART 100 UNIT/ML IJ SOLN
2.0000 [IU] | Freq: Three times a day (TID) | INTRAMUSCULAR | Status: DC
  Administered 2022-11-25 – 2022-11-26 (×5): 2 [IU] via SUBCUTANEOUS

## 2022-11-24 MED ORDER — OXYCODONE HCL 5 MG PO TABS
10.0000 mg | ORAL_TABLET | Freq: Four times a day (QID) | ORAL | Status: DC | PRN
  Administered 2022-11-24 – 2022-11-26 (×4): 10 mg via ORAL
  Filled 2022-11-24 (×4): qty 2

## 2022-11-24 MED ORDER — LORAZEPAM 1 MG PO TABS
1.0000 mg | ORAL_TABLET | Freq: Two times a day (BID) | ORAL | Status: DC | PRN
  Administered 2022-11-24 – 2022-11-26 (×4): 1 mg via ORAL
  Filled 2022-11-24 (×4): qty 1

## 2022-11-24 NOTE — ED Notes (Signed)
ED TO INPATIENT HANDOFF REPORT  S Name/Age/Gender Katherine [name] 67 y.o. female Room/Bed: 008C/008C  Code Status   Code Status: DNR  Home/SNF/Other Rehab Patient oriented to: self, place, time, and situation Is this baseline? Yes   Triage Complete: Triage complete  Chief Complaint Acute respiratory distress [R06.03]  Triage Note Pt bib ems from [facility] c/o SOB d/t not having adequate oxygen at facility. Pt states the concentrator there only went up to 4L and pt says she normally at 7L baseline.   BP 152/100 6L at 92% placed on nonrebreather 10L 98% HR 86   Allergies Allergies  Allergen Reactions   Tetracycline Hives    Patient tolerated Doxycycline Dec 2020   Niacin Other (See Comments)    Mouth blisters   Niaspan [Niacin Er] Other (See Comments)    Mouth blisters   Sulfa Antibiotics Nausea Only and Other (See Comments)    "Tears up stomach"   Sulfonamide Derivatives Other (See Comments)    Reaction: per patient "tears her stomach up"   Codeine Nausea And Vomiting   Erythromycin Nausea And Vomiting   Hydromorphone Hcl Nausea And Vomiting   Morphine And Related Nausea And Vomiting   Nalbuphine Nausea And Vomiting    Nubain   Sulfasalazine Nausea Only and Other (See Comments)    per patient "tears her stomach up", "Tears up stomach"   Tape Rash and Other (See Comments)    No "plastic" tape," please----cloth tape only    Level of Care/Admitting Diagnosis ED Disposition     ED Disposition  Admit   Condition  --   Comment  Hospital Area: [facility] [[id number]]  Level of Care: Progressive [102]  Admit to Progressive based on following criteria: CARDIOVASCULAR & THORACIC of moderate stability with acute coronary syndrome symptoms/low risk myocardial infarction/hypertensive urgency/arrhythmias/heart failure potentially compromising stability and stable post cardiovascular intervention patients.  Admit to Progressive based on  following criteria: RESPIRATORY PROBLEMS hypoxemic/hypercapnic respiratory failure that is responsive to NIPPV (BiPAP) or High Flow Nasal Cannula (6-80 lpm). Frequent assessment/intervention, no > Q2 hrs < Q4 hrs, to maintain oxygenation and pulmonary hygiene.  May admit patient to [facility] or [facility] if equivalent level of care is available:: Yes  Covid Evaluation: Asymptomatic - no recent exposure (last 10 days) testing not required  Diagnosis: Acute respiratory distress [166502]  Admitting Physician: Stephania Fragmin [facility] [[id number]]  Attending Physician: Stephania Fragmin [facility] [[id number]]  Certification:: I certify this patient will need inpatient services for at least 2 midnights  Estimated Length of Stay: 2          B Medical/Surgery History Past Medical History:  Diagnosis Date   Anemia    Anxiety    Bilateral carotid artery stenosis    Carotid duplex 08/9145: 1-39% LICA, 60-79% RICA, >50% RECA, f/u 1 yr suggested   CAD (coronary artery disease) of bypass graft 5/01; 3/'02, 8/'03, 10/'04; 1/15   PCI x 5 to SVG-D1    CAD in native artery 07/1993   3 Vessel Disease (LAD-D1 & RCA) -- CABG (Dx in setting of inferior STEMI-PTCA of RCA)   COPD mixed type    Followed by Dr. Delton Coombes "pulmonologist said no COPD"   Depression with anxiety    Diabetes mellitus type 2 in obese    Diarrhea    started after cholecystectomy and mass removed from intestine   Dyslipidemia, goal LDL below 70    08/2012: TC 137, TG 200, HDL 32!, LDL 45; on  statin (followed by Dr.Deterding)   ESRD (end stage renal disease) 1991   s/p Cadaveric Renal Transplant [facility] - Dr. 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  Had having having lots of nausea and vomiting with poor p.o. intake.  Thought to have QT prolongation with multiple medications involved and hypomagnesemia, hypokalemia.  Tikosyn discontinued along with Zoloft and Phenergan.   Hypertension associated with diabetes    Mild aortic stenosis by prior echocardiogram 07/2019   Echo:  Mild aortic stenosis (gradients: Mean 14.3 mmHg -peak 24.9 mmHg).   Morbid obesity    MRSA (methicillin resistant staph aureus) culture positive    OSA (obstructive sleep apnea)    no longer on CPAP or home O2, states she doesn't need now after lap band   PAD (peripheral artery disease) 08/2013   LEA Dopplers to be read by Dr. Kirke Corin   PAF (paroxysmal atrial fibrillation) 06/2014   Noted on CardioNet Monitor  - --> rhythm control with Tikosyn (Dr. Johney Frame); converted from warfarin to apixaban for anticoagulation.   Pneumonia    Recurrent boils    Bilateral Groin   Rheumatoid arthritis    Per Patient Report; associated with OA   S/p cadaver renal transplant 1991   [facility]   Past Surgical History:  Procedure Laterality Date   ABDOMINAL AORTOGRAM N/A 04/21/2018   Procedure: ABDOMINAL AORTOGRAM;  Surgeon: Marykay Lex, MD;  Location: [facility] INVASIVE CV LAB;  Service: Cardiovascular;  Laterality: N/A;   CATHETER REMOVAL     CHOLECYSTECTOMY N/A 10/29/2014   Procedure: LAPAROSCOPIC CHOLECYSTECTOMY WITH INTRAOPERATIVE CHOLANGIOGRAM;  Surgeon: Glenna Fellows, MD;  Location: [facility] ORS;  Service: General;  Laterality: N/A;   CORONARY ANGIOPLASTY  1994   x5   CORONARY ARTERY BYPASS GRAFT  1995   LIMA-LAD, SVG-RPDA, SVG-D1   CORONARY PRESSURE/FFR STUDY N/A 02/20/2021   Procedure: INTRAVASCULAR PRESSURE WIRE/FFR STUDY;  Surgeon: Marykay Lex, MD;  Location: [facility] INVASIVE CV LAB;  Service: Cardiovascular;  Laterality: N/A;   CORONARY STENT INTERVENTION N/A 02/20/2021   Procedure:  PERCUTANEOUS CORONARY STENT INTERVENTION;  Surgeon: Marykay Lex, MD;  Location: [facility] INVASIVE CV LAB; ostLCx 60% (Neg RFR 0.96);; SVG- D2 recurrent 90% ISR & 95% native D2 after graft-> DES PCI of 95% anastomotic D2 lesion (Onyx Frontier 2.25 mm x 12 mm => 2.75 mm @ overlap, 2.5 distal.);PTCA of ISR in body of graft. ->  2.5 mm scoring balloon & post-dil w/ 2.75 mm Trinity balloon   ESOPHAGOGASTRODUODENOSCOPY N/A 10/15/2016   Procedure: ESOPHAGOGASTRODUODENOSCOPY (EGD);  Surgeon: Charlott Rakes, MD;  Location[facility] ENDOSCOPY;  Service: Endoscopy;  Laterality: N/A;   I & D EXTREMITY Right 01/29/2018   Procedure: IRRIGATION AND DEBRIDEMENT THUMB;  Surgeon: Knute Neu, MD;  Location: [facility] OR;  Service: Plastics;  Laterality: Right;   INCISE AND DRAIN ABCESS     KIDNEY TRANSPLANT  1991   KNEE ARTHROSCOPY WITH LATERAL MENISECTOMY Left 12/03/2017   Procedure: LEFT KNEE ARTHROSCOPY WITH LATERAL MENISECTOMY;  Surgeon: Frederico Hamman, MD;  Location: [facility] OR;  Service: Orthopedics;  Laterality: Left;   LAPAROSCOPIC GASTRIC BANDING  04/2004; 10/'09, 2/'10   Port Replacement x 2   LEFT HEART CATH AND CORONARY ANGIOGRAPHY N/A 02/20/2021   Procedure: LEFT HEART CATH AND CORONARY ANGIOGRAPHY;  Surgeon: Marykay Lex, MD;  Location: [facility] INVASIVE CV LAB;  Service: Cardiovascular;  Laterality: N/A;   LEFT HEART CATH AND CORS/GRAFTS ANGIOGRAPHY N/A 04/21/2018   Procedure: LEFT HEART CATH AND CORS/GRAFTS ANGIOGRAPHY;  Surgeon: Marykay Lex, MD;  Location: [facility] INVASIVE CV LAB;  Ost-Prox LAD 50% - proxLAD (pre & post D1) 100% CTO. Cx - patent, small OM1 (stable ~ ostial OM1 90%, too small for PCI) & 2 small LPL; Ost-distal RCA 100% CTO.  LIMA-LAD (not injected); SVG-dRCA patent, SVG-D1 - insertion stent ~20% ISR - Severe R CFA disease w/ focal Sub TO   LEFT HEART CATH AND CORS/GRAFTS ANGIOGRAPHY  5/'01, 3/'02, 8/'03, 10/'04; 1/'15   08/22/2013: LAD & RCA 100%; LIMA-LAD & SVG-rPDA patent; Cx-- OM1 60%, OM2 ostial ~50%;  SVG-D1 - 80% mid, 50% distal ISR --PCI   LEFT HEART CATH AND CORS/GRAFTS ANGIOGRAPHY N/A 01/31/2021   Procedure: LEFT HEART CATH AND CORS/GRAFTS ANGIOGRAPHY;  Surgeon: Dolores Patty, MD;  Location: [facility] INVASIVE CV LAB;;   LEFT HEART CATH AND CORS/GRAFTS ANGIOGRAPHY N/A 09/05/2021   Procedure: LEFT HEART CATH AND CORS/GRAFTS ANGIOGRAPHY;  Surgeon: Marykay Lex, MD;  Location: [facility] INVASIVE CV LAB;  Service: Cardiovascular;  Laterality: N/A;   LEFT HEART CATH AND CORS/GRAFTS ANGIOGRAPHY N/A 08/21/2021   Procedure: LEFT HEART CATH AND CORS/GRAFTS ANGIOGRAPHY;  Surgeon: Lyn Records, MD;  Location: [facility] INVASIVE CV LAB;  Service: Cardiovascular;  Laterality: N/A;   LEFT HEART CATHETERIZATION WITH CORONARY/GRAFT ANGIOGRAM N/A 08/23/2013   Procedure: LEFT HEART CATHETERIZATION WITH Isabel Caprice;  Surgeon: Iran Ouch, MD;  Location: [facility] CATH LAB;  Service: Cardiovascular;  Laterality: N/A;   Lower Extremity Arterial Dopplers  08/2013   ABI: R 0.96, L 1.04   MULTIPLE TOOTH EXTRACTIONS  age 60   NM MYOVIEW LTD  03/2016   EF 62%. LOW RISK. C/W prior MI - no Ischemia. Apical hypokinesis.   PERCUTANEOUS CORONARY STENT INTERVENTION (PCI-S)  5/'01, 3/'02, 8/'03, 10/'04;   '01 - S660 BMS 2.5 x 9 - dSVG-D1 into D1; '02- post-stent stenosis - 2.5 x 8 Pixel BMS; '8\03: ISR/Thrombosis into native D1 - AngioJet, 2.5 x 13 Pixel; '04 - ISR 95% - covered stented area with Taxus DES 2.5 mm x 20 (2.88)   PERCUTANEOUS CORONARY STENT INTERVENTION (PCI-S)  08/23/2013   Procedure: PERCUTANEOUS CORONARY STENT INTERVENTION (PCI-S);  Surgeon: Iran Ouch, MD;  Location: [facility] CATH LAB;  Service: Cardiovascular;;mid SVG-D1 80%; distal stent ~50% ISR; Promus Prermier DES 2.75 mm xc 20 mm (2.8 mm)   PORT-A-CATH REMOVAL     kidney   TRANSTHORACIC ECHOCARDIOGRAM  07/2019   a) 07/2019: EF 55 to 60%.  No LVH.  Paradoxical septal WM-s/p CABG.  GRII DD.  Nl RV size and fxn.  Mild bilateral atrial dilation.  Mod  MAC.  Trace MR.  Mild AS (gradients: Mean 14.3 mmHg -peak 24.9 mmHg).; B) 06/2020: EF 40 to 45%.  Moderate concentric LVH.  GRII DD.  Elevated LAP.  Mod HK mid Apical Ant-AntSept wall & mild Apical Dyskinesis.  Mod LA dilation.  Mild MR.  AoV sclerosis w/o AS.   TRANSTHORACIC ECHOCARDIOGRAM  01/30/2021   EF 55 to 60%.  Mild LVH.  GR 1 DD.  Elevated LAP.  Moderate LA dilation.  Mild MR with mild MS.  Mild aortic valve stenosis.:   TUBAL LIGATION     wrist fistula repair Left    dialysis for one year     A IV Location/Drains/Wounds Patient Lines/Drains/Airways Status     Active Line/Drains/Airways     Name Placement date Placement time Site Days   Peripheral IV 11/23/22 22 G 11/23/22  2120  --  1   External Urinary Catheter 11/23/22  2130  --  1   Pressure Injury 10/18/21 Buttocks Right Unstageable - Full thickness tissue loss in which the base of the injury is covered by slough (yellow, tan, gray, green or brown) and/or eschar (tan, brown or black) in the wound bed. brown, yellow, tan wound bed 10/18/21  2337  -- 402   Pressure Injury 11/21/21 Heel Right brown colored wound bed 11/21/21  2000  -- 368   Pressure Injury 11/06/22 Sacrum Mid;Lower Stage 2 -  Partial thickness loss of dermis presenting as a shallow open injury with a red, pink wound bed without slough. 11/06/22  2000  -- 18   Wound / Incision (Open or Dehisced) 11/06/22 Other (Comment) Arm Left;Lower;Posterior unknown type of Stage 2 wound w abnormal raised borders 11/06/22  2000  Arm  18            Intake/Output Last 24 hours  Intake/Output Summary (Last 24 hours) at 11/24/2022 1109 Last data filed at 11/24/2022 0015 Gross per 24 hour  Intake --  Output 700 ml  Net -700 ml    Labs/Imaging Results for orders placed or performed during the hospital encounter of 11/23/22 (from the past 48 hour(s))  CBC with Differential     Status: Abnormal   Collection Time: 11/23/22  7:30 PM  Result Value Ref Range   WBC 5.7 4.0 -  10.5 K/uL   RBC 3.04 (L) 3.87 - 5.11 MIL/uL   Hemoglobin 9.1 (L) 12.0 - 15.0 g/dL   HCT 59.9 (L) 35.7 - 01.7 %   MCV 94.4 80.0 - 100.0 fL   MCH 29.9 26.0 - 34.0 pg   MCHC 31.7 30.0 - 36.0 g/dL   RDW 79.3 (H) 90.3 - 00.9 %   Platelets 339 150 - 400 K/uL   nRBC 0.0 0.0 - 0.2 %   Neutrophils Relative % 69 %   Neutro Abs 4.1 1.7 - 7.7 K/uL   Lymphocytes Relative 16 %   Lymphs Abs 0.9 0.7 - 4.0 K/uL   Monocytes Relative 11 %   Monocytes Absolute 0.6 0.1 - 1.0 K/uL   Eosinophils Relative 2 %   Eosinophils Absolute 0.1 0.0 - 0.5 K/uL   Basophils Relative 1 %   Basophils Absolute 0.0 0.0 - 0.1 K/uL   Immature Granulocytes 1 %   Abs Immature Granulocytes 0.03 0.00 - 0.07 K/uL    Comment: Performed at [facility], [address], [place], [place] [address]  Basic metabolic panel     Status: Abnormal   Collection Time: 11/23/22  7:30 PM  Result Value Ref Range   Sodium 134 (L) 135 - 145 mmol/L   Potassium 4.5 3.5 - 5.1 mmol/L   Chloride 96 (L) 98 - 111 mmol/L   CO2 26 22 - 32 mmol/L   Glucose, Bld 203 (H) 70 - 99 mg/dL    Comment: Glucose reference range applies only to samples taken after fasting for at least 8 hours.   BUN 16 8 - 23 mg/dL   Creatinine, Ser 7.62 (H) 0.44 -  1.00 mg/dL   Calcium 9.0 8.9 - 14.7 mg/dL   GFR, Estimated 57 (L) >60 mL/min    Comment: (NOTE) Calculated using the CKD-EPI Creatinine Equation (2021)    Anion gap 12 5 - 15    Comment: Performed at [facility], [address] [place], [place] [address]  Brain natriuretic peptide     Status: Abnormal   Collection Time: 11/23/22  7:30 PM  Result Value Ref Range   B Natriuretic Peptide 993.2 (H) 0.0 - 100.0 pg/mL    Comment: Performed at [facility], [address] [place], [place] [address]  Troponin I (High Sensitivity)     Status: Abnormal   Collection Time: 11/23/22  7:30 PM  Result Value Ref Range   Troponin I (High Sensitivity) 25 (H) <18 ng/L    Comment: (NOTE) Elevated high  sensitivity troponin I (hsTnI) values and significant  changes across serial measurements may suggest ACS but many other  chronic and acute conditions are known to elevate hsTnI results.  Refer to the "Links" section for chest pain algorithms and additional  guidance. Performed at [facility], [address] [place], [place] [address]   Troponin I (High Sensitivity)     Status: Abnormal   Collection Time: 11/23/22  9:30 PM  Result Value Ref Range   Troponin I (High Sensitivity) 26 (H) <18 ng/L    Comment: (NOTE) Elevated high sensitivity troponin I (hsTnI) values and significant  changes across serial measurements may suggest ACS but many other  chronic and acute conditions are known to elevate hsTnI results.  Refer to the "Links" section for chest pain algorithms and additional  guidance. Performed at [facility], [address], [place], [place] [address]   HIV Antibody (routine testing w rflx)     Status: None   Collection Time: 11/23/22 10:18 PM  Result Value Ref Range   HIV Screen 4th Generation wRfx Non Reactive Non Reactive    Comment: Performed at [facility], [address] [place], [place] [address]  SARS Coronavirus 2 by RT PCR (hospital order, performed in [facility] hospital lab) *cepheid single result test* Anterior Nasal Swab     Status: None   Collection Time: 11/23/22 11:18 PM   Specimen: Anterior Nasal Swab  Result Value Ref Range   SARS Coronavirus 2 by RT PCR NEGATIVE NEGATIVE    Comment: Performed at [facility], [address], [place], [place] [address]  Basic metabolic panel     Status: Abnormal   Collection Time: 11/23/22 11:52 PM  Result Value Ref Range   Sodium 135 135 - 145 mmol/L   Potassium 3.9 3.5 - 5.1 mmol/L   Chloride 99 98 - 111 mmol/L   CO2 23 22 - 32 mmol/L   Glucose, Bld 153 (H) 70 - 99 mg/dL    Comment: Glucose reference range applies only to samples taken after fasting for at least 8 hours.   BUN 15 8 - 23  mg/dL   Creatinine, Ser 1.02 (H) 0.44 - 1.00 mg/dL   Calcium 8.8 (L) 8.9 - 10.3 mg/dL   GFR, Estimated 58 (L) >60 mL/min    Comment: (NOTE) Calculated using the CKD-EPI Creatinine Equation (2021)    Anion gap 13 5 - 15    Comment: Performed at [facility], [address], [place], [place] [address]  CBC     Status: Abnormal   Collection Time: 11/23/22  11:52 PM  Result Value Ref Range   WBC 5.6 4.0 - 10.5 K/uL   RBC 3.13 (L) 3.87 - 5.11 MIL/uL   Hemoglobin 9.4 (L) 12.0 - 15.0 g/dL   HCT 08.6 (L) 57.8 - 46.9 %   MCV 99.0 80.0 - 100.0 fL   MCH 30.0 26.0 - 34.0 pg   MCHC 30.3 30.0 - 36.0 g/dL   RDW 62.9 (H) 52.8 - 41.3 %   Platelets 361 150 - 400 K/uL   nRBC 0.4 (H) 0.0 - 0.2 %    Comment: Performed at [facility], [address] [place], [place] [address]  Magnesium     Status: None   Collection Time: 11/23/22 11:52 PM  Result Value Ref Range   Magnesium 2.0 1.7 - 2.4 mg/dL    Comment: Performed at [facility], [address], [place], [place] [address]  Procalcitonin     Status: None   Collection Time: 11/23/22 11:52 PM  Result Value Ref Range   Procalcitonin <0.10 ng/mL    Comment:        Interpretation: PCT (Procalcitonin) <= 0.5 ng/mL: Systemic infection (sepsis) is not likely. Local bacterial infection is possible. (NOTE)       Sepsis PCT Algorithm           Lower Respiratory Tract                                      Infection PCT Algorithm    ----------------------------     ----------------------------         PCT < 0.25 ng/mL                PCT < 0.10 ng/mL          Strongly encourage             Strongly discourage   discontinuation of antibiotics    initiation of antibiotics    ----------------------------     -----------------------------       PCT 0.25 - 0.50 ng/mL            PCT 0.10 - 0.25 ng/mL               OR       >80% decrease in PCT            Discourage initiation of                                            antibiotics       Encourage discontinuation           of antibiotics    ----------------------------     -----------------------------         PCT >= 0.50 ng/mL              PCT 0.26 - 0.50 ng/mL               AND        <80% decrease in PCT             Encourage initiation of  antibiotics       Encourage continuation           of antibiotics    ----------------------------     -----------------------------        PCT >= 0.50 ng/mL                  PCT > 0.50 ng/mL               AND         increase in PCT                  Strongly encourage                                      initiation of antibiotics    Strongly encourage escalation           of antibiotics                                     -----------------------------                                           PCT <= 0.25 ng/mL                                                 OR                                        > 80% decrease in PCT                                      Discontinue / Do not initiate                                             antibiotics  Performed at [facility], [address] [place], [place] [id number]   CBG monitoring, ED     Status: Abnormal   Collection Time: 11/24/22  1:11 AM  Result Value Ref Range   Glucose-Capillary 151 (H) 70 - 99 mg/dL    Comment: Glucose reference range applies only to samples taken after fasting for at least 8 hours.  CBG monitoring, ED     Status: Abnormal   Collection Time: 11/24/22  7:55 AM  Result Value Ref Range   Glucose-Capillary 186 (H) 70 - 99 mg/dL    Comment: Glucose reference range applies only to samples taken after fasting for at least 8 hours.   *Note: Due to a large number of results and/or encounters for the requested time period, some results have not been displayed. A complete set of results can be found in Results Review.   DG Chest Portable 1 View  Result Date: 11/23/2022 CLINICAL DATA:  Shortness of  breath EXAM: PORTABLE CHEST  1 VIEW COMPARISON:  11/19/2022, CT 08/05/2021, chest x-ray 12/07/2021 FINDINGS: Post sternotomy changes. Cardiomegaly.diffuse reticular opacities bilaterally consistent with underlying chronic lung disease. Slight increased hazy pulmonary density may reflect superimposed edema or inflammatory process. No pleural effusion or pneumothorax. Aortic atherosclerosis IMPRESSION: Mild diffuse reticular opacities suspicious for underlying chronic lung disease. Cardiomegaly with slight increased hazy pulmonary density may reflect superimposed edema or inflammatory process. Electronically Signed   By: Jasmine Pang M.D.   On: 11/23/2022 19:03    Pending Labs Unresulted Labs (From admission, onward)     Start     Ordered   11/25/22 0500  CBC  Tomorrow morning,   R        11/24/22 0542   11/24/22 0900  Basic metabolic panel  Once-Timed,   TIMED        11/24/22 0542            Vitals/Pain Today's Vitals   11/24/22 0800 11/24/22 0804 11/24/22 1000 11/24/22 1045  BP: 115/71  (!) 139/54   Pulse: 78  74 64  Resp: 16  (!) 25 15  Temp:      TempSrc:      SpO2: 92%  90% 95%  Weight:      Height:      PainSc:  6       Isolation Precautions No active isolations  Medications Medications  isosorbide mononitrate (IMDUR) 24 hr tablet 30 mg (30 mg Oral Given 11/24/22 0939)  metoprolol tartrate (LOPRESSOR) tablet 50 mg (50 mg Oral Given 11/24/22 0940)  nitroGLYCERIN (NITROSTAT) SL tablet 0.4 mg (has no administration in time range)  ranolazine (RANEXA) 12 hr tablet 1,000 mg (1,000 mg Oral Given 11/24/22 0939)  rosuvastatin (CRESTOR) tablet 20 mg (20 mg Oral Given 11/24/22 0940)  sertraline (ZOLOFT) tablet 200 mg (200 mg Oral Given 11/24/22 0940)  calcitRIOL (ROCALTROL) capsule 0.25 mcg (has no administration in time range)  empagliflozin (JARDIANCE) tablet 10 mg (10 mg Oral Given 11/24/22 0939)  insulin glargine-yfgn (SEMGLEE) injection 8 Units (8 Units Subcutaneous Given 11/24/22 0941)   predniSONE (DELTASONE) tablet 5 mg (5 mg Oral Given 11/24/22 0727)  pantoprazole (PROTONIX) EC tablet 40 mg (40 mg Oral Given 11/24/22 0939)  clopidogrel (PLAVIX) tablet 75 mg (75 mg Oral Given 11/24/22 0939)  apixaban (ELIQUIS) tablet 5 mg (5 mg Oral Given 11/24/22 0940)  azaTHIOprine (IMURAN) tablet 125 mg (125 mg Oral Given 11/24/22 0940)  cyclobenzaprine (FLEXERIL) tablet 10 mg (has no administration in time range)  gabapentin (NEURONTIN) capsule 100 mg (has no administration in time range)  lamoTRIgine (LAMICTAL) tablet 25 mg (25 mg Oral Given 11/23/22 2301)  arformoterol (BROVANA) nebulizer solution 15 mcg (15 mcg Nebulization Given 11/24/22 0941)  budesonide (PULMICORT) nebulizer solution 0.5 mg (has no administration in time range)  montelukast (SINGULAIR) tablet 10 mg (10 mg Oral Given 11/23/22 2301)  promethazine (PHENERGAN) tablet 25 mg (has no administration in time range)  ipratropium-albuterol (DUONEB) 0.5-2.5 (3) MG/3ML nebulizer solution 3 mL (has no administration in time range)  hydrALAZINE (APRESOLINE) injection 10 mg (has no administration in time range)  metoprolol tartrate (LOPRESSOR) injection 5 mg (has no administration in time range)  senna-docusate (Senokot-S) tablet 1 tablet (has no administration in time range)  guaiFENesin (ROBITUSSIN) 100 MG/5ML liquid 5 mL (has no administration in time range)  traZODone (DESYREL) tablet 50 mg (has no administration in time range)  acetaminophen (TYLENOL) tablet 650 mg (has no administration in time range)  ondansetron (ZOFRAN) injection 4 mg (has no  administration in time range)  furosemide (LASIX) injection 40 mg (40 mg Intravenous Given 11/24/22 0729)  insulin aspart (novoLOG) injection 0-9 Units (2 Units Subcutaneous Given 11/24/22 0803)  oxyCODONE (Oxy IR/ROXICODONE) immediate release tablet 10 mg (has no administration in time range)  LORazepam (ATIVAN) tablet 1 mg (has no administration in time range)  ipratropium-albuterol (DUONEB)  0.5-2.5 (3) MG/3ML nebulizer solution 3 mL (3 mLs Nebulization Given 11/23/22 1829)  furosemide (LASIX) injection 40 mg (40 mg Intravenous Given 11/23/22 2147)    Mobility walks with device     Focused Assessments Pulmonary Assessment Handoff:  Lung sounds: Bilateral Breath Sounds: Clear L Breath Sounds: Clear R Breath Sounds: Clear O2 Device: Nasal Cannula O2 Flow Rate (L/min): 7 L/min    R Recommendations: See Admitting Provider Note  Report given to:

## 2022-11-24 NOTE — Discharge Planning (Signed)
TOC consult noted.  Pt from [facility].

## 2022-11-24 NOTE — Inpatient Diabetes Management (Signed)
Inpatient Diabetes Program Recommendations  AACE/ADA: New Consensus Statement on Inpatient Glycemic Control (2015)  Target Ranges:  Prepandial:   less than 140 mg/dL      Peak postprandial:   less than 180 mg/dL (1-2 hours)      Critically ill patients:  140 - 180 mg/dL   Lab Results  Component Value Date   GLUCAP 262 (H) 11/24/2022   HGBA1C 8.3 (H) 11/15/2022    Review of Glycemic Control  Latest Reference Range & Units 11/24/22 07:55 11/24/22 12:19  Glucose-Capillary 70 - 99 mg/dL 093 (H) 267 (H)  (H): Data is abnormally high  Diabetes history: DM2 Outpatient Diabetes medications: Jardiance 10 mg QD, Metformin 500 mg BID Current orders for Inpatient glycemic control: Semglee 8 units QD, Novolog 0-9 units TID, Novolog 0-9 units TID and Prednisone 5 mg QD  Inpatient Diabetes Program Recommendations:    If postprandials consistently elevated, might consider:  Novolog 3 units TID with meals if she consumes at least 50%  Will continue to follow while inpatient.  Thank you, Dulce Sellar, MSN, CDCES Diabetes Coordinator Inpatient Diabetes Program [phone] (team pager from 8a-5p)

## 2022-11-24 NOTE — Progress Notes (Signed)
[facility]                                                                               Katherine [name], is a 67 y.o. 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   Patient was hospitalized from 3/22-3/27 for NSTEMI / acute on chronic combined CHF.    Cardiology consulted, pt not felt to be candidate for LHC due to vascular access issues and transplanted kidney.  TTE with LVEF of 35 to 40%, dyskinesis of mid to distal inferior septal wall and entire apex. Patient was diuresed with IV Lasix and cleared for discharge by cardiology on p.o. Lasix 40 mg twice daily.  Sherryll Burger was initially added but discontinued on discharge due to soft blood pressures.  Imdur was reduced to 30 mg daily as well.  Therapy recommended SNF but patient chose to go home with home health and DME.  Unfortunately, she didn't do well at all after EMS got to her house.  She became severely SOB just going up the front stairs and pt brought back to ED. Patient's dyspnea improved while in ED.  She had mild AKI, which was resolved  She was discharged to SNF on 11/23/2022. But after 2 hours, she reports the oxygen tanks ran out at [facility] and she was dyspneic and was brought to ED.  While in ED, she required upto 10 lit of [place] initially, later transitioned her back to her baseline requirement at 7 li/min . Work up in ED shows elevated troponin and CXR showing pulmonary vascular congestion. She was started on IV lasix 40 mg BID.  Currently she denies any  sob or chest pain.    Assessment & Plan    Assessment and Plan:  Acute on chronic respiratory failure with hypoxia secondary to acute systolic heart  failure: Echocardiogram in March 2024 showed EF of 35%.  BNp IS 993. Cxr with vascular congestion.  Started on IV lasix 40 mg BID. Strict intake and output . Daily weights.    CAD s/p CABG With elevated troponin from demand ischemia from CHF.  No chest pain.  Continue with Plavix, Ranolazine, Crestor, metoprolol and Imdur.    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  Continue with Imuran and Prednisone   Uncontrolled DM: CBG (last 3)  Recent Labs    11/24/22 0111 11/24/22 0755 11/24/22 1219  GLUCAP 151* 186* 262*   Resume SSI. On 8 units of Semglee daily.  Add 2 units of novolog TIDAC.    Paroxysmal atrial fibrillation;  Rate controlled with metoprolol.  On Eliquis.    Anxiety and depression:  Continue with home meds.    Obesity:  Body mass index is 36.41 kg/m. Poor prognostic factor.    Left fore arm skin tear /  skin wound with foam dressing in place.  Will need outpatient follow up with a dermatologist / surgery for excisional biopsy.     Grief: Lost her brother recently. -Emotional support -Chaplain consult if interested.   RN Pressure Injury Documentation: Pressure Injury 10/18/21 Buttocks Right Unstageable - Full thickness tissue loss in which the base of the injury is covered by slough (yellow, tan, gray, green or brown) and/or eschar (tan, brown or black) in the wound bed. brown, yellow, tan wound bed (Active)  10/18/21 2337  Location: Buttocks  Location Orientation: Right  Staging: Unstageable - Full thickness tissue loss in which the base of the injury is covered by slough (yellow, tan, gray, green or brown) and/or eschar (tan, brown or black) in the wound bed.  Wound Description (Comments): brown, yellow, tan wound bed  Present on Admission: Yes     Pressure Injury 11/21/21 Heel Right brown colored wound bed (Active)  11/21/21 2000  Location: Heel  Location Orientation: Right  Staging:   Wound Description  (Comments): brown colored wound bed  Present on Admission: Yes     Pressure Injury 11/06/22 Sacrum Mid;Lower Stage 2 -  Partial thickness loss of dermis presenting as a shallow open injury with a red, pink wound bed without slough. (Active)  11/06/22 2000  Location: Sacrum  Location Orientation: Mid;Lower  Staging: Stage 2 -  Partial thickness loss of dermis presenting as a shallow open injury with a red, pink wound bed without slough.  Wound Description (Comments):   Present on Admission: Yes  Dressing Type Foam - Lift dressing to assess site every shift 11/23/22 0900    Estimated body mass index is 36.41 kg/m as calculated from the following:   Height as of this encounter: 5\' 1"  (1.549 m).   Weight as of this encounter: 87.4 kg.  Code Status: full code.  DVT Prophylaxis:   apixaban (ELIQUIS) tablet 5 mg   Level of Care: Level of care: Progressive Family Communication: none at bedside.   Disposition Plan:     Remains inpatient appropriate:  IV lasix.   Procedures:  None.   Consultants:   None.   Antimicrobials:   Anti-infectives (From admission, onward)    None        Medications  Scheduled Meds:  apixaban  5 mg Oral BID   arformoterol  15 mcg Nebulization BID   azaTHIOprine  125 mg Oral Daily   calcitRIOL  0.25 mcg Oral Q3 days   clopidogrel  75 mg Oral Daily   empagliflozin  10 mg Oral Daily   furosemide  40 mg Intravenous BID   insulin aspart  0-9 Units Subcutaneous TID WC   insulin glargine-yfgn  8 Units Subcutaneous Daily   isosorbide mononitrate  30 mg Oral Daily   lamoTRIgine  25 mg Oral Daily   metoprolol tartrate  50 mg Oral BID   montelukast  10 mg Oral QHS   pantoprazole  40 mg Oral Daily   predniSONE  5 mg Oral Q breakfast   ranolazine  1,000 mg Oral BID   rosuvastatin  20 mg Oral Daily   sertraline  200 mg Oral Daily   Continuous Infusions: PRN Meds:.acetaminophen, budesonide, cyclobenzaprine, gabapentin, guaiFENesin, hydrALAZINE,  ipratropium-albuterol, LORazepam, metoprolol tartrate, nitroGLYCERIN, ondansetron (ZOFRAN) IV, oxyCODONE, promethazine, senna-docusate, traZODone    Subjective:   Anet Kennel was seen and examined today.  She reports being back to baseline.   Objective:   Vitals:   11/24/22 1045 11/24/22 1100 11/24/22 1200 11/24/22  1250  BP:  118/73 114/73 139/83  Pulse: 64 63 64 66  Resp: 15 18 20 16   Temp:    98.2 F (36.8 C)  TempSrc:    Oral  SpO2: 95% 95% 96% 90%  Weight:    87.4 kg  Height:    5\' 1"  (1.549 m)    Intake/Output Summary (Last 24 hours) at 11/24/2022 1514 Last data filed at 11/24/2022 0015 Gross per 24 hour  Intake --  Output 700 ml  Net -700 ml   Filed Weights   11/23/22 1655 11/24/22 1250  Weight: 78.5 kg 87.4 kg     Exam General exam: Appears calm and comfortable  Respiratory system: Clear to auscultation. Respiratory effort normal. Cardiovascular system: S1 & S2 heard, RRR. No JVD,  Gastrointestinal system: Abdomen is nondistended, soft and nontender.  Central nervous system: Alert and oriented. No focal neurological deficits. Extremities: Symmetric 5 x 5 power. Skin: left forearm lesion.  Psychiatry: Mood & affect appropriate.    Data Reviewed:  I have personally reviewed following labs and imaging studies   CBC Lab Results  Component Value Date   WBC 5.6 11/23/2022   RBC 3.13 (L) 11/23/2022   HGB 9.4 (L) 11/23/2022   HCT 31.0 (L) 11/23/2022   MCV 99.0 11/23/2022   MCH 30.0 11/23/2022   PLT 361 11/23/2022   MCHC 30.3 11/23/2022   RDW 19.0 (H) 11/23/2022   LYMPHSABS 0.9 11/23/2022   MONOABS 0.6 11/23/2022   EOSABS 0.1 11/23/2022   BASOSABS 0.0 11/23/2022     Last metabolic panel Lab Results  Component Value Date   NA 136 11/24/2022   K 3.9 11/24/2022   CL 93 (L) 11/24/2022   CO2 29 11/24/2022   BUN 13 11/24/2022   CREATININE 1.14 (H) 11/24/2022   GLUCOSE 290 (H) 11/24/2022   GFRNONAA 53 (L) 11/24/2022   GFRAA 58 (L) 04/12/2020    CALCIUM 8.9 11/24/2022   PHOS 4.5 11/17/2022   PROT 7.4 11/19/2022   ALBUMIN 2.9 (L) 11/19/2022   BILITOT 0.8 11/19/2022   ALKPHOS 98 11/19/2022   AST 21 11/19/2022   ALT 14 11/19/2022   ANIONGAP 14 11/24/2022    CBG (last 3)  Recent Labs    11/24/22 0111 11/24/22 0755 11/24/22 1219  GLUCAP 151* 186* 262*      Coagulation Profile: No results for input(s): "INR", "PROTIME" in the last 168 hours.   Radiology Studies: CT CHEST WO CONTRAST  Result Date: 11/24/2022 CLINICAL DATA:  Dyspnea.  Follow-up from chest radiograph. EXAM: CT CHEST WITHOUT CONTRAST TECHNIQUE: Multidetector CT imaging of the chest was performed following the standard protocol without IV contrast. RADIATION DOSE REDUCTION: This exam was performed according to the departmental dose-optimization program which includes automated exposure control, adjustment of the mA and/or kV according to patient size and/or use of iterative reconstruction technique. COMPARISON:  Chest CT, 08/05/2021.  Chest radiograph, 11/23/2022. FINDINGS: Cardiovascular: Heart is normal in size. Dense three-vessel coronary artery calcifications. No pericardial effusion. Previous CABG surgery. Great vessels normal in caliber.  Aortic atherosclerosis. Mediastinum/Nodes: No neck base, mediastinal or hilar masses. No enlarged lymph nodes. Trachea and esophagus are unremarkable. Lungs/Pleura: Small pleural effusions. Bilateral irregular interstitial thickening. Patchy intervening areas of ground-glass opacity. Associated architectural distortion. No lung mass or suspicious nodule.  No pneumothorax. Upper Abdomen: Well positioned lap band.  No acute findings. Musculoskeletal: No fracture or acute finding. No bone lesion. No chest wall mass. IMPRESSION: 1. Findings of interstitial lung disease with bilateral areas  of interstitial thickening and associated architectural distortion with intervening ground-glass opacities. 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 2. Small effusions. Cannot exclude a component of underlying interstitial pulmonary edema. 3. Previous CABG surgery, coronary artery calcifications and aortic atherosclerosis. Aortic Atherosclerosis (ICD10-I70.0). Electronically Signed   By: Amie Portland M.D.   On: 11/24/2022 12:45   DG Chest Portable 1 View  Result Date: 11/23/2022 CLINICAL DATA:  Shortness of breath EXAM: PORTABLE CHEST 1 VIEW COMPARISON:  11/19/2022, CT 08/05/2021, chest x-ray 12/07/2021 FINDINGS: Post sternotomy changes. Cardiomegaly.diffuse reticular opacities bilaterally consistent with underlying chronic lung disease. Slight increased hazy pulmonary density may reflect superimposed edema or inflammatory process. No pleural effusion or pneumothorax. Aortic atherosclerosis IMPRESSION: Mild diffuse reticular opacities suspicious for underlying chronic lung disease. Cardiomegaly with slight increased hazy pulmonary density may reflect superimposed edema or inflammatory process. Electronically Signed   By: Jasmine Pang M.D.   On: 11/23/2022 19:03       Kathlen Mody M.D. [facility] 11/24/2022, 3:14 PM  Available via Epic secure chat 7am-7pm After 7 pm, please refer to night coverage provider listed on amion.

## 2022-11-24 NOTE — ED Notes (Signed)
Patient extremely anxious and crying when RN entered room. Patient states she has been asking for anxiety medicine and pain medicine. Patient then upset that she is only given 5mg  oxycodone and not 10 mg. Patient states she has been grieving the loss of her brother and is very upset with the facility she was recently placed at.

## 2022-11-25 DIAGNOSIS — R0603 Acute respiratory distress: Secondary | ICD-10-CM | POA: Diagnosis not present

## 2022-11-25 LAB — BASIC METABOLIC PANEL
Anion gap: 12 (ref 5–15)
BUN: 13 mg/dL (ref 8–23)
CO2: 31 mmol/L (ref 22–32)
Calcium: 8.7 mg/dL — ABNORMAL LOW (ref 8.9–10.3)
Chloride: 92 mmol/L — ABNORMAL LOW (ref 98–111)
Creatinine, Ser: 1.25 mg/dL — ABNORMAL HIGH (ref 0.44–1.00)
GFR, Estimated: 48 mL/min — ABNORMAL LOW (ref >60)
Glucose, Bld: 125 mg/dL — ABNORMAL HIGH (ref 70–99)
Potassium: 3.1 mmol/L — ABNORMAL LOW (ref 3.5–5.1)
Sodium: 135 mmol/L (ref 135–145)

## 2022-11-25 LAB — GLUCOSE, CAPILLARY
Glucose-Capillary: 138 mg/dL — ABNORMAL HIGH (ref 70–99)
Glucose-Capillary: 133 mg/dL — ABNORMAL HIGH (ref 70–99)
Glucose-Capillary: 187 mg/dL — ABNORMAL HIGH (ref 70–99)
Glucose-Capillary: 323 mg/dL — ABNORMAL HIGH (ref 70–99)

## 2022-11-25 LAB — CBC
HCT: 31 % — ABNORMAL LOW (ref 36.0–46.0)
Hemoglobin: 10 g/dL — ABNORMAL LOW (ref 12.0–15.0)
MCH: 29.7 pg (ref 26.0–34.0)
MCHC: 32.3 g/dL (ref 30.0–36.0)
MCV: 92 fL (ref 80.0–100.0)
Platelets: 363 10*3/uL (ref 150–400)
RBC: 3.37 MIL/uL — ABNORMAL LOW (ref 3.87–5.11)
RDW: 18.9 % — ABNORMAL HIGH (ref 11.5–15.5)
WBC: 8 10*3/uL (ref 4.0–10.5)
nRBC: 0.4 % — ABNORMAL HIGH (ref 0.0–0.2)

## 2022-11-25 LAB — MRSA NEXT GEN BY PCR, NASAL: MRSA by PCR Next Gen: DETECTED — AB

## 2022-11-25 MED ORDER — POLYVINYL ALCOHOL 1.4 % OP SOLN
1.0000 [drp] | OPHTHALMIC | Status: DC | PRN
  Administered 2022-11-26: 1 [drp] via OPHTHALMIC
  Filled 2022-11-25: qty 15

## 2022-11-25 MED ORDER — ONDANSETRON 4 MG PO TBDP
4.0000 mg | ORAL_TABLET | Freq: Three times a day (TID) | ORAL | Status: DC | PRN
  Administered 2022-11-25: 4 mg via ORAL
  Filled 2022-11-25: qty 1

## 2022-11-25 MED ORDER — INSULIN ASPART 100 UNIT/ML IJ SOLN
0.0000 [IU] | Freq: Three times a day (TID) | INTRAMUSCULAR | Status: DC
  Administered 2022-11-26: 3 [IU] via SUBCUTANEOUS

## 2022-11-25 MED ORDER — INSULIN ASPART 100 UNIT/ML IJ SOLN
0.0000 [IU] | Freq: Every day | INTRAMUSCULAR | Status: DC
  Administered 2022-11-26: 3 [IU] via SUBCUTANEOUS

## 2022-11-25 MED ORDER — METOPROLOL SUCCINATE ER 25 MG PO TB24
25.0000 mg | ORAL_TABLET | Freq: Two times a day (BID) | ORAL | Status: DC
  Administered 2022-11-25 – 2022-11-26 (×2): 25 mg via ORAL
  Filled 2022-11-25 (×2): qty 1

## 2022-11-25 MED ORDER — ARFORMOTEROL TARTRATE 15 MCG/2ML IN NEBU
15.0000 ug | INHALATION_SOLUTION | Freq: Two times a day (BID) | RESPIRATORY_TRACT | Status: DC
  Administered 2022-11-25 – 2022-11-26 (×3): 15 ug via RESPIRATORY_TRACT
  Filled 2022-11-25 (×3): qty 2

## 2022-11-25 MED ORDER — METOPROLOL SUCCINATE ER 50 MG PO TB24: 50.0000 mg | ORAL_TABLET | Freq: Every day | ORAL | Status: DC

## 2022-11-25 MED ORDER — MUPIROCIN 2 % EX OINT
TOPICAL_OINTMENT | Freq: Two times a day (BID) | CUTANEOUS | Status: DC
  Filled 2022-11-25: qty 22

## 2022-11-25 MED ORDER — POTASSIUM CHLORIDE CRYS ER 20 MEQ PO TBCR
40.0000 meq | EXTENDED_RELEASE_TABLET | ORAL | Status: AC
  Administered 2022-11-25 (×2): 40 meq via ORAL
  Filled 2022-11-25 (×2): qty 2

## 2022-11-25 NOTE — Progress Notes (Signed)
PROGRESS NOTE    Katherine [name]  GEX:[id number] DOB: Aug 13, 1956 DOA: 11/23/2022 PCP: [name], MD   Brief Narrative:  67 year old F with PMH of COPD, combined CHF, OSA, chronic hypoxic RF on 7 L, CAD, ESRD s/p renal transplant, Afib on eliquis returning with shortness of breath 3 hours after hospital discharge ON 11/23/2022.   Patient was hospitalized from 3/22-3/27 for NSTEMI / acute on chronic combined CHF.    Cardiology consulted, pt not felt to be candidate for LHC due to vascular access issues and transplanted kidney.  TTE with LVEF of 35 to 40%, dyskinesis of mid to distal inferior septal wall and entire apex. Patient was diuresed with IV Lasix and cleared for discharge by cardiology on p.o. Lasix 40 mg twice daily.  Sherryll Burger was initially added but discontinued on discharge due to soft blood pressures.  Imdur was reduced to 30 mg daily as well.  Therapy recommended SNF but patient chose to go home with home health and DME.  Unfortunately, she didn't do well at all after EMS got to her house.  She became severely SOB just going up the front stairs and pt brought back to ED. Patient's dyspnea improved while in ED.  She had mild AKI, which was resolved  She was discharged to SNF on 11/23/2022. But after 2 hours, she reports the oxygen tanks ran out at [facility] and she was dyspneic and was brought to ED.  While in ED, she required upto 10 lit of [place] initially, later transitioned her back to her baseline requirement at 7 li/min . Work up in ED shows elevated troponin and CXR showing pulmonary vascular congestion. She was started on IV lasix 40 mg BID.  Currently she denies any  sob or chest pain.   Assessment & Plan:   Principal Problem:   Acute respiratory distress Active Problems:   CAD (coronary artery disease) of bypass graft   Paroxysmal atrial fibrillation (HCC); CHA2DS2VASc score F, HTN, CAD, CVA = 5   Type 2 diabetes mellitus with hyperlipidemia   Renal transplant disorder    Dyslipidemia, goal LDL below 70   CAD S/P percutaneous coronary angioplasty - PCI x 5 to SVG-D1   Essential hypertension   PAD (peripheral artery disease) (HCC)   COPD (chronic obstructive pulmonary disease)   Chronic combined systolic and diastolic heart failure   CHF exacerbation  Acute on chronic respiratory failure with hypoxia secondary to acute on chronic systolic heart failure: Echocardiogram in March 2024 showed EF of 35%.  BNp IS 993. Cxr with pulmonary edema.  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  She was started on IV Lasix, she is net -2.3 L.  Currently she is requiring 7 L of oxygen at rest which is her baseline but she is requiring 8 L with activity.  Essentially she is at her baseline and in my opinion, she is medically ready for discharge.  She clearly states that she does not want to go to any rehab and she wants to go home although she lives alone with a dog and she has nobody else to take care of her.  Interestingly, seen by PT OT and they recommended home health.  Patient does want to go home but she is adamant that she will not go home today.  She would like to stay in the hospital another night, I had lengthy discussion with her but she is firm on  what she wants.  Will continue IV Lasix with a strict I's and O's and daily weight.  She is aware that she will be discharged tomorrow and she agrees with that plan.   CAD s/p CABG With elevated but flat troponin from demand ischemia from CHF.  No chest pain.  Continue with Plavix, Ranolazine, Crestor, metoprolol and Imdur.    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  Continue with Imuran and Prednisone   Uncontrolled DM type II: Blood sugar controlled.  Continue current long-acting and short-acting insulin with SSI.   Paroxysmal atrial fibrillation;  Rate controlled with metoprolol.  On Eliquis.     Anxiety and depression:  Continue with home meds.    Morbid obesity:  Body mass index is 36.41 kg/m. Poor prognostic factor.  Weight loss and diet modification counseled.   Left fore arm skin tear / skin wound with foam dressing in place.  Will need outpatient follow up with a dermatologist / surgery for excisional biopsy.  She has been seen by wound care here.    Grief: Lost her brother recently.  Does not appear to be emotional.  DVT prophylaxis: Eliquis   Code Status: DNR  Family Communication:  None present at bedside.  Plan of care discussed with patient in length and he/she verbalized understanding and agreed with it.  Status is: Inpatient Remains inpatient appropriate because: Medically stable but she does not want to be discharged today.   Estimated body mass index is 37.24 kg/m as calculated from the following:   Height as of this encounter: 5\' 1"  (1.549 m).   Weight as of this encounter: 89.4 kg.  Pressure Injury 10/18/21 Buttocks Right Unstageable - Full thickness tissue loss in which the base of the injury is covered by slough (yellow, tan, gray, green or brown) and/or eschar (tan, brown or black) in the wound bed. brown, yellow, tan wound bed (Active)  10/18/21 2337  Location: Buttocks  Location Orientation: Right  Staging: Unstageable - Full thickness tissue loss in which the base of the injury is covered by slough (yellow, tan, gray, green or brown) and/or eschar (tan, brown or black) in the wound bed.  Wound Description (Comments): brown, yellow, tan wound bed  Present on Admission: Yes     Pressure Injury 11/21/21 Heel Right brown colored wound bed (Active)  11/21/21 2000  Location: Heel  Location Orientation: Right  Staging:   Wound Description (Comments): brown colored wound bed  Present on Admission: Yes     Pressure Injury 11/06/22 Sacrum Mid;Lower Stage 2 -  Partial thickness loss of dermis presenting as a shallow open injury with a red, pink wound  bed without slough. (Active)  11/06/22 2000  Location: Sacrum  Location Orientation: Mid;Lower  Staging: Stage 2 -  Partial thickness loss of dermis presenting as a shallow open injury with a red, pink wound bed without slough.  Wound Description (Comments):   Present on Admission: Yes  Dressing Type Foam - Lift dressing to assess site every shift 11/24/22 1955   Nutritional Assessment: Body mass index is 37.24 kg/m.[name] Kitchen. Seen by dietician.  I agree with the assessment and plan as outlined below: Nutrition Status:        . Skin Assessment: I have examined the patient's skin and I agree with the wound assessment as performed by the wound care RN as outlined below: Pressure Injury 10/18/21 Buttocks Right Unstageable - Full thickness tissue loss in which the base of  the injury is covered by slough (yellow, tan, gray, green or brown) and/or eschar (tan, brown or black) in the wound bed. brown, yellow, tan wound bed (Active)  10/18/21 2337  Location: Buttocks  Location Orientation: Right  Staging: Unstageable - Full thickness tissue loss in which the base of the injury is covered by slough (yellow, tan, gray, green or brown) and/or eschar (tan, brown or black) in the wound bed.  Wound Description (Comments): brown, yellow, tan wound bed  Present on Admission: Yes     Pressure Injury 11/21/21 Heel Right brown colored wound bed (Active)  11/21/21 2000  Location: Heel  Location Orientation: Right  Staging:   Wound Description (Comments): brown colored wound bed  Present on Admission: Yes     Pressure Injury 11/06/22 Sacrum Mid;Lower Stage 2 -  Partial thickness loss of dermis presenting as a shallow open injury with a red, pink wound bed without slough. (Active)  11/06/22 2000  Location: Sacrum  Location Orientation: Mid;Lower  Staging: Stage 2 -  Partial thickness loss of dermis presenting as a shallow open injury with a red, pink wound bed without slough.  Wound Description  (Comments):   Present on Admission: Yes  Dressing Type Foam - Lift dressing to assess site every shift 11/24/22 1955    Consultants:  None  Procedures:  None  Antimicrobials:  Anti-infectives (From admission, onward)    None         Subjective: Patient seen and examined.  She says that she still has some shortness of breath.  No other complaint.  Objective: Vitals:   11/24/22 2048 11/25/22 0202 11/25/22 0531 11/25/22 0752  BP: 110/63 (!) 110/59 112/81 126/66  Pulse: 71 69 61 64  Resp: 15 17 17 16   Temp: 98.2 F (36.8 C) 98 F (36.7 C) 98.2 F (36.8 C) 98.2 F (36.8 C)  TempSrc: Oral Oral Oral Oral  SpO2: 93%   97%  Weight:   89.4 kg   Height:        Intake/Output Summary (Last 24 hours) at 11/25/2022 1113 Last data filed at 11/25/2022 0838 Gross per 24 hour  Intake 240 ml  Output 3000 ml  Net -2760 ml   Filed Weights   11/23/22 1655 11/24/22 1250 11/25/22 0531  Weight: 78.5 kg 87.4 kg 89.4 kg    Examination:  General exam: Appears calm and comfortable  Respiratory system: Clear to auscultation. Respiratory effort normal. Cardiovascular system: S1 & S2 heard, RRR. No JVD, murmurs, rubs, gallops or clicks. No pedal edema. Gastrointestinal system: Abdomen is nondistended, soft and nontender. No organomegaly or masses felt. Normal bowel sounds heard. Central nervous system: Alert and oriented. No focal neurological deficits. Extremities: Symmetric 5 x 5 power. Skin: No rashes, lesions or ulcers Psychiatry: Judgement and insight appear normal. Mood & affect appropriate.    Data Reviewed: I have personally reviewed following labs and imaging studies  CBC: Recent Labs  Lab 11/19/22 1511 11/23/22 1930 11/23/22 2352 11/25/22 0047  WBC 5.6 5.7 5.6 8.0  NEUTROABS 4.2 4.1  --   --   HGB 10.9* 9.1* 9.4* 10.0*  HCT 34.2* 28.7* 31.0* 31.0*  MCV 97.2 94.4 99.0 92.0  PLT 356 339 361 363   Basic Metabolic Panel: Recent Labs  Lab 11/22/22 0211  11/23/22 1930 11/23/22 2352 11/24/22 1025 11/25/22 0047  NA 136 134* 135 136 135  K 3.5 4.5 3.9 3.9 3.1*  CL 99 96* 99 93* 92*  CO2 27 26 23 29  31  GLUCOSE 203* 203* 153* 290* 125*  BUN 14 16 15 13 13   CREATININE 1.01* 1.08* 1.07* 1.14* 1.25*  CALCIUM 8.5* 9.0 8.8* 8.9 8.7*  MG  --   --  2.0  --   --    GFR: Estimated Creatinine Clearance: 45.6 mL/min (A) (by C-G formula based on SCr of 1.25 mg/dL (H)). Liver Function Tests: Recent Labs  Lab 11/19/22 1511  AST 21  ALT 14  ALKPHOS 98  BILITOT 0.8  PROT 7.4  ALBUMIN 2.9*   No results for input(s): "LIPASE", "AMYLASE" in the last 168 hours. No results for input(s): "AMMONIA" in the last 168 hours. Coagulation Profile: No results for input(s): "INR", "PROTIME" in the last 168 hours. Cardiac Enzymes: No results for input(s): "CKTOTAL", "CKMB", "CKMBINDEX", "TROPONINI" in the last 168 hours. BNP (last 3 results) No results for input(s): "PROBNP" in the last 8760 hours. HbA1C: No results for input(s): "HGBA1C" in the last 72 hours. CBG: Recent Labs  Lab 11/24/22 0755 11/24/22 1219 11/24/22 1657 11/24/22 2103 11/25/22 0633  GLUCAP 186* 262* 183* 155* 138*   Lipid Profile: No results for input(s): "CHOL", "HDL", "LDLCALC", "TRIG", "CHOLHDL", "LDLDIRECT" in the last 72 hours. Thyroid Function Tests: No results for input(s): "TSH", "T4TOTAL", "FREET4", "T3FREE", "THYROIDAB" in the last 72 hours. Anemia Panel: No results for input(s): "VITAMINB12", "FOLATE", "FERRITIN", "TIBC", "IRON", "RETICCTPCT" in the last 72 hours. Sepsis Labs: Recent Labs  Lab 11/23/22 2352  PROCALCITON <0.10    Recent Results (from the past 240 hour(s))  SARS Coronavirus 2 by RT PCR (hospital order, performed in [facility] hospital lab) *cepheid single result test* Anterior Nasal Swab     Status: None   Collection Time: 11/23/22 11:18 PM   Specimen: Anterior Nasal Swab  Result Value Ref Range Status   SARS Coronavirus 2 by RT PCR NEGATIVE  NEGATIVE Final    Comment: Performed at [facility], [address], [place], [place] [address]     Radiology Studies: CT CHEST WO CONTRAST  Result Date: 11/24/2022 CLINICAL DATA:  Dyspnea.  Follow-up from chest radiograph. EXAM: CT CHEST WITHOUT CONTRAST TECHNIQUE: Multidetector CT imaging of the chest was performed following the standard protocol without IV contrast. RADIATION DOSE REDUCTION: This exam was performed according to the departmental dose-optimization program which includes automated exposure control, adjustment of the mA and/or kV according to patient size and/or use of iterative reconstruction technique. COMPARISON:  Chest CT, 08/05/2021.  Chest radiograph, 11/23/2022. FINDINGS: Cardiovascular: Heart is normal in size. Dense three-vessel coronary artery calcifications. No pericardial effusion. Previous CABG surgery. Great vessels normal in caliber.  Aortic atherosclerosis. Mediastinum/Nodes: No neck base, mediastinal or hilar masses. No enlarged lymph nodes. Trachea and esophagus are unremarkable. Lungs/Pleura: Small pleural effusions. Bilateral irregular interstitial thickening. Patchy intervening areas of ground-glass opacity. Associated architectural distortion. No lung mass or suspicious nodule.  No pneumothorax. Upper Abdomen: Well positioned lap band.  No acute findings. Musculoskeletal: No fracture or acute finding. No bone lesion. No chest wall mass. IMPRESSION: 1. Findings of interstitial lung disease with bilateral areas of interstitial thickening and associated architectural distortion with intervening ground-glass opacities. 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 2. Small effusions. Cannot exclude a component of underlying interstitial pulmonary edema. 3. Previous CABG surgery, coronary artery calcifications and aortic atherosclerosis. Aortic Atherosclerosis (ICD10-I70.0). Electronically Signed   By: Amie Portland M.D.   On: 11/24/2022 12:45   DG Chest Portable 1  View  Result Date: 11/23/2022 CLINICAL DATA:  Shortness of breath EXAM: PORTABLE CHEST 1 VIEW COMPARISON:  11/19/2022, CT 08/05/2021, chest x-ray 12/07/2021 FINDINGS: Post sternotomy changes. Cardiomegaly.diffuse reticular opacities bilaterally consistent with underlying chronic lung disease. Slight increased hazy pulmonary density may reflect superimposed edema or inflammatory process. No pleural effusion or pneumothorax. Aortic atherosclerosis IMPRESSION: Mild diffuse reticular opacities suspicious for underlying chronic lung disease. Cardiomegaly with slight increased hazy pulmonary density may reflect superimposed edema or inflammatory process. Electronically Signed   By: Jasmine Pang M.D.   On: 11/23/2022 19:03    Scheduled Meds:  apixaban  5 mg Oral BID   arformoterol  15 mcg Nebulization BID   azaTHIOprine  125 mg Oral Daily   calcitRIOL  0.25 mcg Oral Q3 days   clopidogrel  75 mg Oral Daily   empagliflozin  10 mg Oral Daily   furosemide  40 mg Intravenous BID   insulin aspart  0-9 Units Subcutaneous TID WC   insulin aspart  2 Units Subcutaneous TID WC   insulin glargine-yfgn  8 Units Subcutaneous Daily   isosorbide mononitrate  30 mg Oral Daily   lamoTRIgine  25 mg Oral Daily   metoprolol succinate  25 mg Oral BID   montelukast  10 mg Oral QHS   mupirocin ointment   Topical BID   pantoprazole  40 mg Oral Daily   potassium chloride  40 mEq Oral Q4H   predniSONE  5 mg Oral Q breakfast   ranolazine  1,000 mg Oral BID   rosuvastatin  20 mg Oral Daily   sertraline  200 mg Oral Daily   Continuous Infusions:   LOS: 2 days   Hughie Closs, MD [facility]  11/25/2022, 11:13 AM   *Please note that this is a verbal dictation therefore any spelling or grammatical errors are due to the "Dragon [employer] One" system interpretation.  Please page via Amion and do not message via secure chat for urgent patient care matters.  Secure chat can be used for non urgent patient care matters.  How to contact the [facility] Attending or Consulting provider 7A - 7P or covering provider during after hours 7P -7A, for this patient?  Check the care team in [facility] and look for a) attending/consulting [facility] provider listed and b) the [facility] team listed. Page or secure chat 7A-7P. Log into [email or web address] and use [facility]'s universal password to access. If you do not have the password, please contact the hospital operator. Locate the [facility] provider you are looking for under [facility] and page to a number that you can be directly reached. If you still have difficulty reaching the provider, please page the [facility] (Director on Call) for the Hospitalists listed on amion for assistance.

## 2022-11-25 NOTE — Consult Note (Signed)
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 Etiology, atypical  Pressure Injury POA: NA  Measurement:  3 cm x 3 cms  Wound bed: 60% dry hyperkeratotic tissue, 20% yellow devitalized tissue, 20% pink dry  Drainage (amount, consistency, odor) scant serosanguinous on dressing  Periwound: mild erythema Dressing procedure/placement/frequency: Clean L forearm lesion with NS, apply Mupirocin twice a day to wound bed, cover with silicone foam.  May lift silicone foam to reapply Mupirocin.  Change foam dressing q3 days and prn soiling.   POC discussed with patient and bedside nurse.  WOC will not follow this patient at this time.  Re-consult if further needs arise.    Thank you,     Priscella Mann MSN, RN-BC, [employer] [phone]

## 2022-11-25 NOTE — Progress Notes (Signed)
Heart Failure Navigator Progress Note  Assessed for Heart & Vascular TOC clinic readiness.  Patient EF 35-40%, Declined a HF TOC appointment, stated she wants to stay with Dr. Herbie Baltimore only. .   Navigator will sign off at this time.   Rhae Hammock, BSN, Scientist, clinical (histocompatibility and immunogenetics) Only

## 2022-11-25 NOTE — Evaluation (Signed)
Physical Therapy Evaluation Patient Details Name: Katherine [name] MRN: [id number] DOB: 10-03-1955 Today's Date: 11/25/2022  History of Present Illness  Pt is 67 year old presented to [facility] on  11/11/22 for SOB. Pt had just been dc'd home from hospital and [facility] called EMS due to pt reporting chest pain and SOB on arrival home. Pt was then discharged to SNF on 11/23/22 and admitted 3 hours later for dyspnea with findings of elevated troponin and chest x-ray showing pulmonary vascular congestion. PMH - ESRD s/p renal transplant on chronic immunosuppression, CAD, CABG, afib, chronic diastolic CHF, COPD on home O2, DM, HTN, depression/anxiety, chronic pain  Clinical Impression  Extra time was spent with pt today. She has been in and out of the hospital recently with her last discharge to [facility]. She was not placed appropriately on O2 per pt and became very dyspneic and re-admitted to hospital 2-3 hours later. Pt would rather not go to rehab after this experience and is very hopeful for home. Discussed groceries, showers, help at home, stairs and safe ways to pace activities. Pt has a friend Patty who is able to come 1x/day but has paralysis and is unable to assist physically. Discussed sitting in a chair at the top of the stairs and making sure that she is on her O2 prior to strenuous activities, resting prior to perform gait and resting before she feels weak rather than waiting until she feels weak. Functionally pt is moving okay and is cleared by physical therapy for home with 3x/week skilled physical therapy services to work on strength, endurance, balance, functional mobility to decrease risk for falls, injury, immobility and skin break down. Pt will need PRN supervision from friend to ensure safety and will need to continue to work on pacing activities. Pt would benefit from a home aide and transportation form medical appointments.      Recommendations for follow up therapy are one component of  a multi-disciplinary discharge planning process, led by the attending physician.  Recommendations may be updated based on patient status, additional functional criteria and insurance authorization.  Follow Up Recommendations       Assistance Recommended at Discharge PRN  Patient can return home with the following  A little help with walking and/or transfers;Assist for transportation;Help with stairs or ramp for entrance;Assistance with cooking/housework    Equipment Recommendations None recommended by PT  Recommendations for Other Services       Functional Status Assessment Patient has had a recent decline in their functional status and demonstrates the ability to make significant improvements in function in a reasonable and predictable amount of time.     Precautions / Restrictions Precautions Precautions: Fall;Other (comment) Precaution Comments: watch O2 Restrictions Weight Bearing Restrictions: No      Mobility  Bed Mobility Overal bed mobility: Modified Independent Bed Mobility: Supine to Sit     Supine to sit: Modified independent (Device/Increase time)     General bed mobility comments: Pt left up in recliner. Able to get up out of bed without assistance and very minimal use of bed rails. Patient Response: Cooperative  Transfers Overall transfer level: Needs assistance Equipment used: Rollator (4 wheels) Transfers: Sit to/from Stand Sit to Stand: Modified independent (Device/Increase time)           General transfer comment: Good safety with brakes on rollator, no overt LOB or need for physical assistance for multiple sit to stand throughout session.    Ambulation/Gait Ambulation/Gait assistance: Modified independent (Device/Increase time) Gait  Distance (Feet): 250 Feet (seated rest break at 150 ft on 8L o2 via [place] using tank.) Assistive device: Rollator (4 wheels) Gait Pattern/deviations: Step-through pattern, Decreased step length - right, Decreased step  length - left, Decreased stride length Gait velocity: decreased Gait velocity interpretation: <1.31 ft/sec, indicative of household ambulator   General Gait Details: Pt took 1 seated rest break O2 sats remained around 88-91% during gait dropping to 85% after 150 ft when pt took seated rest break.  Stairs Stairs: Yes Stairs assistance: Supervision Stair Management: One rail Right Number of Stairs: 3 General stair comments: pt O2 sats dropped down to 85% after 3 stairs on 8L O2 via [place]. No physical assist required. Pt states at home she can sit on a chair that is at the top of the stairs in able to give herself time to recover.       Balance Overall balance assessment: Needs assistance Sitting-balance support: Feet supported, No upper extremity supported Sitting balance-Leahy Scale: Good Sitting balance - Comments: Seated in chair   Standing balance support: Reliant on assistive device for balance, Bilateral upper extremity supported Standing balance-Leahy Scale: Good Standing balance comment: no overt LOB. Pt is able to stand without UE support and no buckling of the knee.       Pertinent Vitals/Pain Pain Assessment Pain Assessment: No/denies pain    Home Living Family/patient expects to be discharged to:: Private residence Living Arrangements: Alone Available Help at Discharge: Family;Friend(s);Available PRN/intermittently Type of Home: House Home Access: Stairs to enter Entrance Stairs-Rails: Left Entrance Stairs-Number of Steps: 5   Home Layout: One level Home Equipment: Rollator (4 wheels);Cane - single point;Tub bench;Grab bars - toilet;Wheelchair - manual;Hospital bed;BSC/3in1;Transport chair Additional Comments: 7LO2 baseline;  Pt with recent change in support system has a friend Patty who sounds like she may be a hemiplegic and cannot help physically.    Prior Function Prior Level of Function : Independent/Modified Independent             Mobility Comments:  Limited ambulation distance and assist during last hospitalization. Was receiving assist from brother whe recently passed away. Pt uses rollator at home.       Hand Dominance   Dominant Hand: Right    Extremity/Trunk Assessment   Upper Extremity Assessment Upper Extremity Assessment: Overall WFL for tasks assessed    Lower Extremity Assessment Lower Extremity Assessment: Generalized weakness    Cervical / Trunk Assessment Cervical / Trunk Assessment: Kyphotic  Communication   Communication: No difficulties  Cognition Arousal/Alertness: Awake/alert Behavior During Therapy: WFL for tasks assessed/performed Overall Cognitive Status: Within Functional Limits for tasks assessed     General Comments: Pt adament about wanting to go home.        General Comments General comments (skin integrity, edema, etc.): Pt O2 sats between 88-91% on 8L via tank with [place]. after 150 ft of gait pt O2 sats dropped down to 85% but improved quickly and dopped again with stairs but recovered quickly with rest. Educated pt on pacing activities in order to manage O2 sats.        Assessment/Plan    PT Assessment Patient needs continued PT services  PT Problem List Decreased strength;Decreased activity tolerance;Decreased balance;Decreased mobility;Cardiopulmonary status limiting activity       PT Treatment Interventions DME instruction;Gait training;Functional mobility training;Therapeutic exercise;Therapeutic activities;Balance training;Patient/family education    PT Goals (Current goals can be found in the Care Plan section)  Acute Rehab PT Goals Patient Stated Goal: To go home and  get stronger. PT Goal Formulation: With patient Time For Goal Achievement: 12/09/22 Potential to Achieve Goals: Good    Frequency Min 1X/week        AM-PAC PT "6 Clicks" Mobility  Outcome Measure Help needed turning from your back to your side while in a flat bed without using bedrails?: None Help needed  moving from lying on your back to sitting on the side of a flat bed without using bedrails?: None Help needed moving to and from a bed to a chair (including a wheelchair)?: None Help needed standing up from a chair using your arms (e.g., wheelchair or bedside chair)?: None Help needed to walk in hospital room?: None Help needed climbing 3-5 steps with a railing? : A Little 6 Click Score: 23    End of Session Equipment Utilized During Treatment: Gait belt;Oxygen Activity Tolerance: Patient tolerated treatment well Patient left: in chair;with call bell/phone within reach Nurse Communication: Mobility status;Other (comment) (RN and MD on pt respiratory status) PT Visit Diagnosis: Other abnormalities of gait and mobility (R26.89);Muscle weakness (generalized) (M62.81);History of falling (Z91.81)    Time: [id number] PT Time Calculation (min) (ACUTE ONLY): 53 min   Charges:   PT Evaluation $PT Eval Low Complexity: 1 Low PT Treatments $Gait Training: 8-22 mins $Therapeutic Activity: 23-37 mins        Harrel Carinaatherine Serayah Yazdani, DPT, CLT  Acute Rehabilitation Services Office: [phone] (Secure chat preferred)   Claudia DesanctisCatherine H Jannelly Bergren 11/25/2022, 1:09 PM

## 2022-11-26 ENCOUNTER — Inpatient Hospital Stay (HOSPITAL_COMMUNITY): Payer: Medicare Other

## 2022-11-26 ENCOUNTER — Other Ambulatory Visit (HOSPITAL_COMMUNITY): Payer: Self-pay

## 2022-11-26 DIAGNOSIS — R0603 Acute respiratory distress: Secondary | ICD-10-CM | POA: Diagnosis not present

## 2022-11-26 LAB — GLUCOSE, CAPILLARY
Glucose-Capillary: 117 mg/dL — ABNORMAL HIGH (ref 70–99)
Glucose-Capillary: 237 mg/dL — ABNORMAL HIGH (ref 70–99)
Glucose-Capillary: 270 mg/dL — ABNORMAL HIGH (ref 70–99)

## 2022-11-26 LAB — BASIC METABOLIC PANEL
Anion gap: 15 (ref 5–15)
BUN: 12 mg/dL (ref 8–23)
CO2: 32 mmol/L (ref 22–32)
Calcium: 9.7 mg/dL (ref 8.9–10.3)
Chloride: 92 mmol/L — ABNORMAL LOW (ref 98–111)
Creatinine, Ser: 1.21 mg/dL — ABNORMAL HIGH (ref 0.44–1.00)
GFR, Estimated: 50 mL/min — ABNORMAL LOW (ref >60)
Glucose, Bld: 249 mg/dL — ABNORMAL HIGH (ref 70–99)
Potassium: 3.9 mmol/L (ref 3.5–5.1)
Sodium: 139 mmol/L (ref 135–145)

## 2022-11-26 LAB — D-DIMER, QUANTITATIVE: D-Dimer, Quant: 1.64 ug/mL-FEU — ABNORMAL HIGH (ref 0.00–0.50)

## 2022-11-26 LAB — TROPONIN I (HIGH SENSITIVITY)
Troponin I (High Sensitivity): 28 ng/L — ABNORMAL HIGH (ref <18)
Troponin I (High Sensitivity): 27 ng/L — ABNORMAL HIGH (ref <18)

## 2022-11-26 MED ORDER — GLUCOCOM TEST VI STRP: ORAL_STRIP | 0 refills | Status: DC

## 2022-11-26 MED ORDER — FREESTYLE LIBRE 3 SENSOR MISC: 1.0000 [IU] | Freq: Three times a day (TID) | 0 refills | Status: DC

## 2022-11-26 MED ORDER — GLUCOCOM TEST VI STRP
ORAL_STRIP | 0 refills | Status: DC
  Filled 2022-11-26: qty 100, 30d supply, fill #0

## 2022-11-26 MED ORDER — FREESTYLE LANCETS MISC
5 refills | Status: DC
  Filled 2022-11-26: qty 60, 30d supply, fill #0

## 2022-11-26 MED ORDER — FREESTYLE LANCETS MISC: 5 refills | Status: DC

## 2022-11-26 MED ORDER — ALPRAZOLAM 0.25 MG PO TABS: 0.2500 mg | ORAL_TABLET | Freq: Two times a day (BID) | ORAL | Status: DC | PRN

## 2022-11-26 MED ORDER — FUROSEMIDE 40 MG PO TABS
40.0000 mg | ORAL_TABLET | Freq: Two times a day (BID) | ORAL | 0 refills | Status: DC
Start: 2022-11-26 — End: 2022-12-13

## 2022-11-26 MED ORDER — FREESTYLE LIBRE 3 SENSOR MISC
1.0000 [IU] | Freq: Three times a day (TID) | 0 refills | Status: DC
  Filled 2022-11-26: qty 2, 30d supply, fill #0

## 2022-11-26 NOTE — Progress Notes (Signed)
Pt complaining of chest pain, EKG ordered. VSS stable. MD notified

## 2022-11-26 NOTE — Plan of Care (Signed)
Problem: Education: Goal: Understanding of cardiac disease, CV risk reduction, and recovery process will improve 11/26/2022 1600 by Sherian MaroonKing, Marita Burnsed D, RN Outcome: Adequate for Discharge 11/26/2022 1600 by Sherian MaroonKing, Anusha Claus D, RN Outcome: Adequate for Discharge Goal: Individualized Educational Video(s) 11/26/2022 1600 by Sherian MaroonKing, Chatara Lucente D, RN Outcome: Adequate for Discharge 11/26/2022 1600 by Sherian MaroonKing, Talea Manges D, RN Outcome: Adequate for Discharge   Problem: Activity: Goal: Ability to tolerate increased activity will improve 11/26/2022 1600 by Sherian MaroonKing, Jakin Pavao D, RN Outcome: Adequate for Discharge 11/26/2022 1600 by Sherian MaroonKing, Chailyn Racette D, RN Outcome: Adequate for Discharge   Problem: Cardiac: Goal: Ability to achieve and maintain adequate cardiovascular perfusion will improve 11/26/2022 1600 by Sherian MaroonKing, Kattaleya Alia D, RN Outcome: Adequate for Discharge 11/26/2022 1600 by Sherian MaroonKing, Harel Repetto D, RN Outcome: Adequate for Discharge   Problem: Health Behavior/Discharge Planning: Goal: Ability to safely manage health-related needs after discharge will improve 11/26/2022 1600 by Sherian MaroonKing, Dollie Bressi D, RN Outcome: Adequate for Discharge 11/26/2022 1600 by Sherian MaroonKing, Jeancarlo Leffler D, RN Outcome: Adequate for Discharge   Problem: Education: Goal: Ability to demonstrate management of disease process will improve 11/26/2022 1600 by Sherian MaroonKing, Takyah Ciaramitaro D, RN Outcome: Adequate for Discharge 11/26/2022 1600 by Sherian MaroonKing, Amad Mau D, RN Outcome: Adequate for Discharge Goal: Ability to verbalize understanding of medication therapies will improve 11/26/2022 1600 by Sherian MaroonKing, Tryphena Perkovich D, RN Outcome: Adequate for Discharge 11/26/2022 1600 by Sherian MaroonKing, Alyanah Elliott D, RN Outcome: Adequate for Discharge Goal: Individualized Educational Video(s) 11/26/2022 1600 by Sherian MaroonKing, Jese Comella D, RN Outcome: Adequate for Discharge 11/26/2022 1600 by Sherian MaroonKing, Merryl Buckels D, RN Outcome: Adequate for Discharge   Problem: Activity: Goal: Capacity to carry out activities will improve 11/26/2022 1600 by  Sherian MaroonKing, Joia Doyle D, RN Outcome: Adequate for Discharge 11/26/2022 1600 by Sherian MaroonKing, [place] Langstaff D, RN Outcome: Adequate for Discharge   Problem: Cardiac: Goal: Ability to achieve and maintain adequate cardiopulmonary perfusion will improve 11/26/2022 1600 by Sherian MaroonKing, Meng Winterton D, RN Outcome: Adequate for Discharge 11/26/2022 1600 by Sherian MaroonKing, Terriana Barreras D, RN Outcome: Adequate for Discharge   Problem: Education: Goal: Knowledge of General Education information will improve Description: Including pain rating scale, medication(s)/side effects and non-pharmacologic comfort measures 11/26/2022 1600 by Sherian MaroonKing, Aby Gessel D, RN Outcome: Adequate for Discharge 11/26/2022 1600 by Sherian MaroonKing, Ceaser Ebeling D, RN Outcome: Adequate for Discharge   Problem: Health Behavior/Discharge Planning: Goal: Ability to manage health-related needs will improve 11/26/2022 1600 by Sherian MaroonKing, Deldrick Linch D, RN Outcome: Adequate for Discharge 11/26/2022 1600 by Sherian MaroonKing, Alanmichael Barmore D, RN Outcome: Adequate for Discharge   Problem: Clinical Measurements: Goal: Ability to maintain clinical measurements within normal limits will improve 11/26/2022 1600 by Sherian MaroonKing, Lena Fieldhouse D, RN Outcome: Adequate for Discharge 11/26/2022 1600 by Sherian MaroonKing, Simmie Garin D, RN Outcome: Adequate for Discharge Goal: Will remain free from infection 11/26/2022 1600 by Sherian MaroonKing, Kirra Verga D, RN Outcome: Adequate for Discharge 11/26/2022 1600 by Sherian MaroonKing, Daquavion Catala D, RN Outcome: Adequate for Discharge Goal: Diagnostic test results will improve 11/26/2022 1600 by Sherian MaroonKing, Breyon Sigg D, RN Outcome: Adequate for Discharge 11/26/2022 1600 by Sherian MaroonKing, Lakyia Behe D, RN Outcome: Adequate for Discharge Goal: Respiratory complications will improve 11/26/2022 1600 by Sherian MaroonKing, Katilyn Miltenberger D, RN Outcome: Adequate for Discharge 11/26/2022 1600 by Sherian MaroonKing, Adasha Boehme D, RN Outcome: Adequate for Discharge Goal: Cardiovascular complication will be avoided 11/26/2022 1600 by Sherian MaroonKing, Cyrus Ramsburg D, RN Outcome: Adequate for Discharge 11/26/2022 1600 by Sherian MaroonKing, Lashandra Arauz  D, RN Outcome: Adequate for Discharge   Problem: Activity: Goal: Risk for activity intolerance will decrease 11/26/2022 1600 by Sherian MaroonKing, Chai Verdejo D, RN Outcome: Adequate for Discharge 11/26/2022 1600 by Sherian MaroonKing, Nyquan Selbe D, RN Outcome:  Adequate for Discharge   Problem: Nutrition: Goal: Adequate nutrition will be maintained 11/26/2022 1600 by Sherian Maroon, RN Outcome: Adequate for Discharge 11/26/2022 1600 by Sherian Maroon, RN Outcome: Adequate for Discharge   Problem: Coping: Goal: Level of anxiety will decrease 11/26/2022 1600 by Sherian Maroon, RN Outcome: Adequate for Discharge 11/26/2022 1600 by Sherian Maroon, RN Outcome: Adequate for Discharge   Problem: Elimination: Goal: Will not experience complications related to bowel motility 11/26/2022 1600 by Sherian Maroon, RN Outcome: Adequate for Discharge 11/26/2022 1600 by Sherian Maroon, RN Outcome: Adequate for Discharge Goal: Will not experience complications related to urinary retention 11/26/2022 1600 by Sherian Maroon, RN Outcome: Adequate for Discharge 11/26/2022 1600 by Sherian Maroon, RN Outcome: Adequate for Discharge   Problem: Pain Managment: Goal: General experience of comfort will improve 11/26/2022 1600 by Sherian Maroon, RN Outcome: Adequate for Discharge 11/26/2022 1600 by Sherian Maroon, RN Outcome: Adequate for Discharge   Problem: Safety: Goal: Ability to remain free from injury will improve 11/26/2022 1600 by Sherian Maroon, RN Outcome: Adequate for Discharge 11/26/2022 1600 by Sherian Maroon, RN Outcome: Adequate for Discharge   Problem: Skin Integrity: Goal: Risk for impaired skin integrity will decrease 11/26/2022 1600 by Sherian Maroon, RN Outcome: Adequate for Discharge 11/26/2022 1600 by Sherian Maroon, RN Outcome: Adequate for Discharge   Problem: Education: Goal: Ability to describe self-care measures that may prevent or decrease complications (Diabetes Survival Skills Education) will  improve 11/26/2022 1600 by Sherian Maroon, RN Outcome: Adequate for Discharge 11/26/2022 1600 by Sherian Maroon, RN Outcome: Adequate for Discharge Goal: Individualized Educational Video(s) 11/26/2022 1600 by Sherian Maroon, RN Outcome: Adequate for Discharge 11/26/2022 1600 by Sherian Maroon, RN Outcome: Adequate for Discharge   Problem: Coping: Goal: Ability to adjust to condition or change in health will improve 11/26/2022 1600 by Sherian Maroon, RN Outcome: Adequate for Discharge 11/26/2022 1600 by Sherian Maroon, RN Outcome: Adequate for Discharge   Problem: Fluid Volume: Goal: Ability to maintain a balanced intake and output will improve 11/26/2022 1600 by Sherian Maroon, RN Outcome: Adequate for Discharge 11/26/2022 1600 by Sherian Maroon, RN Outcome: Adequate for Discharge   Problem: Health Behavior/Discharge Planning: Goal: Ability to identify and utilize available resources and services will improve 11/26/2022 1600 by Sherian Maroon, RN Outcome: Adequate for Discharge 11/26/2022 1600 by Sherian Maroon, RN Outcome: Adequate for Discharge Goal: Ability to manage health-related needs will improve 11/26/2022 1600 by Sherian Maroon, RN Outcome: Adequate for Discharge 11/26/2022 1600 by Sherian Maroon, RN Outcome: Adequate for Discharge   Problem: Metabolic: Goal: Ability to maintain appropriate glucose levels will improve 11/26/2022 1600 by Sherian Maroon, RN Outcome: Adequate for Discharge 11/26/2022 1600 by Sherian Maroon, RN Outcome: Adequate for Discharge   Problem: Nutritional: Goal: Maintenance of adequate nutrition will improve 11/26/2022 1600 by Sherian Maroon, RN Outcome: Adequate for Discharge 11/26/2022 1600 by Sherian Maroon, RN Outcome: Adequate for Discharge Goal: Progress toward achieving an optimal weight will improve 11/26/2022 1600 by Sherian Maroon, RN Outcome: Adequate for Discharge 11/26/2022 1600 by Sherian Maroon, RN Outcome: Adequate  for Discharge   Problem: Skin Integrity: Goal: Risk for impaired skin integrity will decrease 11/26/2022 1600 by Sherian Maroon, RN Outcome: Adequate for Discharge 11/26/2022 1600 by Sherian Maroon, RN Outcome: Adequate for Discharge   Problem: Tissue Perfusion: Goal: Adequacy of tissue perfusion will  improve 11/26/2022 1600 by Sherian Maroon, RN Outcome: Adequate for Discharge 11/26/2022 1600 by Sherian Maroon, RN Outcome: Adequate for Discharge

## 2022-11-26 NOTE — Progress Notes (Signed)
Mobility Specialist Progress Note:   11/26/22 1155  Mobility  Activity Transferred to/from [facility]  Level of Assistance Standby assist, set-up cues, supervision of patient - no hands on  Assistive Device BSC  Distance Ambulated (ft) 5 ft  Activity Response Tolerated well  Mobility Referral Yes  $Mobility charge 1 Mobility   Pt tearful and easily agitated this am. Required no physical assist during transfers. Pt refusing sitting in chair or ambulation at this time. Back in bed with all needs met.   Addison Lank Mobility Specialist Please contact via SecureChat or  Rehab office at [phone]

## 2022-11-26 NOTE — Progress Notes (Signed)
Pt Discharge is extensive with several request, asked about a glucose meter. Messaged MD for a script. IV tele removed and urine emptied waiting on [facility].

## 2022-11-26 NOTE — Discharge Summary (Signed)
Physician Discharge Summary  Avriel Kandel YQM:[id number] DOB: 31-Jan-1956 DOA: 11/23/2022  PCP: [name], MD  Admit date: 11/23/2022 Discharge date: 11/26/2022 30 Day Unplanned Readmission Risk Score    Flowsheet Row ED to Hosp-Admission (Current) from 11/23/2022 in [facility] 3E HF PCU  30 Day Unplanned Readmission Risk Score (%) 39.55 Filed at 11/26/2022 1200       This score is the patient's risk of an unplanned readmission within 30 days of being discharged (0 -100%). The score is based on dignosis, age, lab data, medications, orders, and past utilization.   Low:  0-14.9   Medium: 15-21.9   High: 22-29.9   Extreme: 30 and above          Admitted From: SNF Disposition: Home  Recommendations for Outpatient Follow-up:  Follow up with PCP in 1-2 weeks Please obtain BMP/CBC in one week Follow-up with your cardiologist in 1 to 2 weeks Please follow up with your PCP on the following pending results: Unresulted Labs (From admission, onward)    None         Home Health: Yes Equipment/Devices: None  Discharge Condition: Stable CODE STATUS: DNR Diet recommendation: Low-sodium  Subjective: Seen and examined this morning.  Patient was having panic attack and complaint of shortness of breath or chest pain.  Lungs had rhonchi which were actually better than yesterday.  EKG was done which did not show any acute ST-T wave changes.  Although this was clearly a panic attack due to anxiety since patient was crying, however we pursued further workup with cardiac enzymes which were slightly elevated at her baseline and flat, also pursued D-dimer which was also slightly elevated but again at her baseline and she is on Eliquis 5 mg p.o. twice daily.  The fact that she received anxiety and felt better after our significantly reduces any suspicion for any thromboembolism especially in the setting of her being on the anticoagulation.  Patient is now feeling better.  She is being  discharged home.  Brief/Interim Summary: 67 year old F with PMH of COPD, combined CHF, OSA, chronic hypoxic RF on 7 L, CAD, ESRD s/p renal transplant, Afib on eliquis returning with shortness of breath 3 hours after hospital discharge ON 11/23/2022.   Patient was hospitalized from 3/22-3/27 for NSTEMI / acute on chronic combined CHF.    Cardiology consulted, pt not felt to be candidate for LHC due to vascular access issues and transplanted kidney.  TTE with LVEF of 35 to 40%, dyskinesis of mid to distal inferior septal wall and entire apex. Patient was diuresed with IV Lasix and cleared for discharge by cardiology on p.o. Lasix 40 mg twice daily.  Sherryll Burger was initially added but discontinued on discharge due to soft blood pressures.  Imdur was reduced to 30 mg daily as well.  Therapy recommended SNF but patient chose to go home with home health and DME.  Unfortunately, she didn't do well at all after EMS got to her house.  She became severely SOB just going up the front stairs and pt brought back to ED. Patient's dyspnea improved while in ED.  She had mild AKI, which was resolved  She was discharged to SNF on 11/23/2022. But after 2 hours, she reports the oxygen tanks ran out at [facility] and she was dyspneic and was brought to ED.  While in ED, she required upto 10 lit of [place] initially, later transitioned her back to her baseline requirement at 7 li/min . Work up in ED  shows slightly elevated troponin and CXR showing pulmonary vascular congestion. She was started on IV lasix 40 mg BID.  Admitted under hospitalist service.  Details below.   Acute on chronic respiratory failure with hypoxia secondary to acute on chronic systolic heart failure: Echocardiogram in March 2024 showed EF of 35%.  BNp IS 993 which was not far from her baseline. Cxr with pulmonary edema and her chest x-ray often shows pulmonary edema.  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  She was started on IV Lasix, she is net - 4.6 L.  Currently she is requiring 6-7 L of oxygen at rest which is her baseline.  Patient was medically ready for discharge even yesterday.  She clearly stated that she does not want to go to any rehab and she wanted to go home although she lives alone with a dog and she has nobody else to take care of her.  Interestingly, when seen by PT OT and they recommended home health.  Patient did not want to go home yesterday and she was adamant that she will not go home.  She stated that she would like to stay in the hospital another night, I had lengthy discussion with her but she is firm on what she wanted.  I set up expectations with her that she will be discharged today on 11/26/2022.  However, right before I was going to see, she complained of chest pain and became very emotional and started crying.  As mentioned above, I saw her.  This was likely panic attack due to anxiety or could very well be malingering.  Nonetheless, patient is feeling better now, after she received insulin headaches.  She is at her baseline and is stable so she is going to be discharged home.   CAD s/p CABG With elevated but flat troponin from demand ischemia from CHF.  No chest pain.  Continue with Plavix, Ranolazine, Crestor, metoprolol and Imdur.    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  Continue with Imuran and Prednisone   Uncontrolled DM type II: Blood sugar controlled.  Continue current long-acting and short-acting insulin with SSI.   Paroxysmal atrial fibrillation;  Rate controlled with metoprolol.  On Eliquis.    Anxiety and depression:  Continue with home meds.    Morbid obesity:  Body mass index is 36.41 kg/m. Poor prognostic factor.  Weight loss and diet modification counseled.   Left fore arm skin tear / skin wound with foam dressing in place.  Will need outpatient follow up with a dermatologist / surgery for  excisional biopsy.  She has been seen by wound care here.    Grief: Lost her brother recently.  This obviously may be one of the cause of her anxiety and emotional state.   Discharge Diagnoses:  Principal Problem:   Acute respiratory distress Active Problems:   CAD (coronary artery disease) of bypass graft   Paroxysmal atrial fibrillation (HCC); CHA2DS2VASc score F, HTN, CAD, CVA = 5   Type 2 diabetes mellitus with hyperlipidemia   Renal transplant disorder   Dyslipidemia, goal LDL below 70   CAD S/P percutaneous coronary angioplasty - PCI x 5 to SVG-D1   Essential hypertension   PAD (peripheral artery disease) (HCC)   COPD (chronic obstructive pulmonary disease)   Chronic combined systolic and diastolic heart failure   CHF exacerbation    Discharge Instructions  Allergies as of 11/26/2022       Reactions   Tetracycline Hives   Patient tolerated Doxycycline Dec 2020   Niacin Other (See Comments)   Mouth blisters   Niaspan [niacin Er] Other (See Comments)   Mouth blisters   Sulfa Antibiotics Nausea Only, Other (See Comments)   "Tears up stomach"   Sulfonamide Derivatives Other (See Comments)   Reaction: per patient "tears her stomach up"   Codeine Nausea And Vomiting   Erythromycin Nausea And Vomiting   Hydromorphone Hcl Nausea And Vomiting   Morphine And Related Nausea And Vomiting   Nalbuphine Nausea And Vomiting   Nubain   Sulfasalazine Nausea Only, Other (See Comments)   per patient "tears her stomach up", "Tears up stomach"   Tape Rash, Other (See Comments)   No "plastic" tape," please----cloth tape only        Medication List     TAKE these medications    albuterol 108 (90 Base) MCG/ACT inhaler Commonly known as: Ventolin HFA TAKE 2 PUFFS BY MOUTH EVERY 6 HOURS AS NEEDED FOR WHEEZE OR SHORTNESS OF BREATH What changed:  how much to take how to take this when to take this reasons to take this additional instructions   arformoterol 15 MCG/2ML  [place] Commonly known as: BROVANA Take 2 mLs (15 mcg total) by nebulization 2 (two) times daily.   azaTHIOprine 50 MG tablet Commonly known as: IMURAN Take 2 & 1/2 tablets (125 mg) by mouth daily at 3pm What changed: when to take this   bisacodyl 5 MG EC tablet Commonly known as: DULCOLAX Take 1 tablet (5 mg total) by mouth daily as needed for moderate constipation.   budesonide 0.5 MG/2ML nebulizer solution Commonly known as: PULMICORT Take 2 mLs (0.5 mg total) by nebulization 2 (two) times daily. What changed:  when to take this reasons to take this   calcitRIOL 0.25 MCG capsule Commonly known as: ROCALTROL Take 0.25 mcg by mouth See admin instructions. Takes every 3 days.   clopidogrel 75 MG tablet Commonly known as: PLAVIX Take 1 tablet (75 mg total) by mouth daily.   [employer] Vitamin C 500 MG tablet Generic drug: ascorbic acid TAKE 1 TABLET (500 MG TOTAL) BY MOUTH DAILY. What changed: how much to take   cyclobenzaprine 10 MG tablet Commonly known as: FLEXERIL TAKE 1 TABLET BY MOUTH TWICE A DAY AS NEEDED FOR MUSCLE SPASMS What changed: See the new instructions.   Eliquis 5 MG Tabs tablet Generic drug: apixaban TAKE 1 TABLET BY MOUTH TWICE A DAY   Ensure Max Protein Liqd Take 330 mLs (11 oz total) by mouth 2 (two) times daily.   fluticasone 50 MCG/ACT nasal spray Commonly known as: FLONASE PLACE 2 SPRAYS INTO BOTH NOSTRILS 2 TIMES DAILY. What changed: See the new instructions.   FreeStyle Libre 2 Reader Gilman 1 Act by Does not apply route daily.   FreeStyle Libre 2 Sensor Misc 1 Act by Does not apply route daily.   furosemide 40 MG tablet Commonly known as: LASIX Take 1 tablet (40 mg total) by mouth 2 (two) times daily. What changed: when to take this   gabapentin 100 MG capsule Commonly known as: NEURONTIN Take 1 capsule (100 mg total) by mouth at bedtime. What changed:  when to take this reasons to take this   guaiFENesin-dextromethorphan 100-10 MG/5ML  syrup Commonly known as: ROBITUSSIN DM Take 15 mLs by mouth every 4 (four) hours as needed for cough.   hydrocortisone 25 MG suppository  Commonly known as: ANUSOL-HC Place 1 suppository (25 mg total) rectally 2 (two) times daily.   insulin glargine-yfgn 100 UNIT/ML injection Commonly known as: SEMGLEE Inject 0.08 mLs (8 Units total) into the skin daily.   isosorbide mononitrate 30 MG 24 hr tablet Commonly known as: IMDUR Take 1 tablet (30 mg total) by mouth daily.   Jardiance 10 MG Tabs tablet Generic drug: empagliflozin Take 1 tablet (10 mg total) by mouth daily.   Klor-Con M10 10 MEQ tablet Generic drug: potassium chloride TAKE 1 TABLET BY MOUTH 2 TIMES DAILY.   lamoTRIgine 25 MG tablet Commonly known as: LAMICTAL TAKE 1 TABLET BY MOUTH EVERYDAY AT BEDTIME What changed: See the new instructions.   LORazepam 1 MG tablet Commonly known as: ATIVAN Take 1 tablet (1 mg total) by mouth every 8 (eight) hours as needed for up to 3 days for anxiety.   metFORMIN 500 MG tablet Commonly known as: GLUCOPHAGE Take 500 mg by mouth 2 (two) times daily.   metoprolol tartrate 50 MG tablet Commonly known as: LOPRESSOR Take 1 tablet (50 mg total) by mouth 2 (two) times daily.   montelukast 10 MG tablet Commonly known as: SINGULAIR TAKE 1 TABLET BY MOUTH EVERYDAY AT BEDTIME What changed: See the new instructions.   multivitamin with minerals Tabs tablet Take 1 tablet by mouth daily.   nitroGLYCERIN 0.4 MG SL tablet Commonly known as: NITROSTAT PLACE 1 TAB UNDER TONGUE EVERY 5MINS FOR CHEST PAIN What changed:  how much to take how to take this when to take this reasons to take this additional instructions   Oxycodone HCl 10 MG Tabs Take 1 tablet (10 mg total) by mouth every 8 (eight) hours as needed for up to 3 days.   pantoprazole 40 MG tablet Commonly known as: PROTONIX TAKE 1 TABLET BY MOUTH EVERY DAY   Pen Needles 32G X 4 MM Misc Use as needed to administer insulin.  E11.9   predniSONE 5 MG tablet Commonly known as: DELTASONE Take 5 mg by mouth daily with breakfast.   promethazine 25 MG tablet Commonly known as: PHENERGAN Take 1 tablet (25 mg total) by mouth every 6 (six) hours as needed for nausea or vomiting.   ranolazine 1000 MG SR tablet Commonly known as: RANEXA TAKE 1 TABLET BY MOUTH TWICE A DAY   rosuvastatin 20 MG tablet Commonly known as: CRESTOR TAKE 1 TABLET BY MOUTH EVERY DAY   sertraline 100 MG tablet Commonly known as: ZOLOFT Take 2 tablets (200 mg total) by mouth daily.   witch hazel-glycerin pad Commonly known as: TUCKS Apply topically as needed for itching.        Follow-up Information     Etta GrandchildJones, Thomas L, MD Follow up.   Specialty: Internal Medicine Contact information: [address] [place] [address] [phone]         Etta GrandchildJones, Thomas L, MD Follow up in 1 week(s).   Specialty: Internal Medicine Contact information: [address] [place] [address] [phone]                Allergies  Allergen Reactions   Tetracycline Hives    Patient tolerated Doxycycline Dec 2020   Niacin Other (See Comments)    Mouth blisters   Niaspan [Niacin Er] Other (See Comments)    Mouth blisters   Sulfa Antibiotics Nausea Only and Other (See Comments)    "Tears up stomach"   Sulfonamide Derivatives Other (See Comments)    Reaction: per patient "tears her  stomach up"   Codeine Nausea And Vomiting   Erythromycin Nausea And Vomiting   Hydromorphone Hcl Nausea And Vomiting   Morphine And Related Nausea And Vomiting   Nalbuphine Nausea And Vomiting    Nubain   Sulfasalazine Nausea Only and Other (See Comments)    per patient "tears her stomach up", "Tears up stomach"   Tape Rash and Other (See Comments)    No "plastic" tape," please----cloth tape only    Consultations: None   Procedures/Studies: DG CHEST PORT 1 VIEW  Result Date: 11/26/2022 CLINICAL DATA:  shortness of breath EXAM:  PORTABLE CHEST 1 VIEW . COMPARISON:  11/23/2022 and CT 11/24/2022 FINDINGS: Previous median sternotomy and CABG procedure. Stable cardiomediastinal contours. Reticular interstitial markings within both lungs compatible with chronic interstitial lung disease. Mild peripheral increased septal markings within the lower lung zones may reflect trace interstitial edema. Blunting of the costophrenic angles compatible with small effusions. Visualized osseous structures are unremarkable. IMPRESSION: 1. Suspect trace interstitial edema. 2. Small bilateral pleural effusions. 3. Chronic interstitial lung disease. Electronically Signed   By: Signa Kell M.D.   On: 11/26/2022 11:00   CT CHEST WO CONTRAST  Result Date: 11/24/2022 CLINICAL DATA:  Dyspnea.  Follow-up from chest radiograph. EXAM: CT CHEST WITHOUT CONTRAST TECHNIQUE: Multidetector CT imaging of the chest was performed following the standard protocol without IV contrast. RADIATION DOSE REDUCTION: This exam was performed according to the departmental dose-optimization program which includes automated exposure control, adjustment of the mA and/or kV according to patient size and/or use of iterative reconstruction technique. COMPARISON:  Chest CT, 08/05/2021.  Chest radiograph, 11/23/2022. FINDINGS: Cardiovascular: Heart is normal in size. Dense three-vessel coronary artery calcifications. No pericardial effusion. Previous CABG surgery. Great vessels normal in caliber.  Aortic atherosclerosis. Mediastinum/Nodes: No neck base, mediastinal or hilar masses. No enlarged lymph nodes. Trachea and esophagus are unremarkable. Lungs/Pleura: Small pleural effusions. Bilateral irregular interstitial thickening. Patchy intervening areas of ground-glass opacity. Associated architectural distortion. No lung mass or suspicious nodule.  No pneumothorax. Upper Abdomen: Well positioned lap band.  No acute findings. Musculoskeletal: No fracture or acute finding. No bone lesion. No  chest wall mass. IMPRESSION: 1. Findings of interstitial lung disease with bilateral areas of interstitial thickening and associated architectural distortion with intervening ground-glass opacities. 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 2. Small effusions. Cannot exclude a component of underlying interstitial pulmonary edema. 3. Previous CABG surgery, coronary artery calcifications and aortic atherosclerosis. Aortic Atherosclerosis (ICD10-I70.0). Electronically Signed   By: Amie Portland M.D.   On: 11/24/2022 12:45   DG Chest Portable 1 View  Result Date: 11/23/2022 CLINICAL DATA:  Shortness of breath EXAM: PORTABLE CHEST 1 VIEW COMPARISON:  11/19/2022, CT 08/05/2021, chest x-ray 12/07/2021 FINDINGS: Post sternotomy changes. Cardiomegaly.diffuse reticular opacities bilaterally consistent with underlying chronic lung disease. Slight increased hazy pulmonary density may reflect superimposed edema or inflammatory process. No pleural effusion or pneumothorax. Aortic atherosclerosis IMPRESSION: Mild diffuse reticular opacities suspicious for underlying chronic lung disease. Cardiomegaly with slight increased hazy pulmonary density may reflect superimposed edema or inflammatory process. Electronically Signed   By: Jasmine Pang M.D.   On: 11/23/2022 19:03   DG Chest 2 View  Result Date: 11/19/2022 CLINICAL DATA:  Dyspnea EXAM: CHEST - 2 VIEW COMPARISON:  11/11/2022 FINDINGS: Sternotomy and CABG. Stable cardiomegaly. Bilateral airspace and interstitial opacities. No definite pleural effusion. No pneumothorax. No acute osseous abnormality. IMPRESSION: Cardiomegaly and diffuse pulmonary edema, worsened  from 11/11/2022. Electronically Signed   By: Minerva Fester M.D.   On: 11/19/2022 17:34   DG Chest 2 View  Result Date: 11/11/2022 CLINICAL DATA:  Shortness of breath EXAM: CHEST - 2 VIEW COMPARISON:  11/07/2022, CT 08/05/2021, 09/20/2021, chest x-ray  05/15/2021 FINDINGS: Post sternotomy changes. Cardiomegaly. No pleural effusion or pneumothorax. Mild diffuse interstitial opacity which may be secondary to low-grade edema, overall aeration is improved compared to the radiograph from 03/23. IMPRESSION: Cardiomegaly with mild diffuse interstitial opacity which may be secondary to low-grade edema but overall aeration is improved as compared with 11/07/2022. Electronically Signed   By: Jasmine Pang M.D.   On: 11/11/2022 21:58   ECHOCARDIOGRAM COMPLETE  Result Date: 11/09/2022    ECHOCARDIOGRAM REPORT   Patient Name:   TARONDA KOKES Date of Exam: 11/09/2022 Medical Rec #:  [id number]          Height:       61.0 in Accession #:    [id number]         Weight:       177.3 lb Date of Birth:  08/11/1956          BSA:          1.794 m Patient Age:    66 years           BP:           136/69 mmHg Patient Gender: F                  HR:           102 bpm. Exam Location:  Inpatient Procedure: 2D Echo, Cardiac Doppler and Color Doppler Indications:    Chest Pain R07.9  History:        Patient has prior history of Echocardiogram examinations, most                 recent 12/09/2021. CHF, CAD, PAD and COPD, Arrythmias:LBBB,                 Atrial Fibrillation, Tachycardia and Bradycardia,                 Signs/Symptoms:Chest Pain; Risk Factors:Hypertension, Diabetes,                 Dyslipidemia and Sleep Apnea. ESRD.  Sonographer:    Lucendia Herrlich Referring Phys: [id number] JENNIFER CHOI  Sonographer Comments: Image acquisition challenging due to uncooperative patient. IMPRESSIONS  1. Technically difficult study with limited visualization of cardiac structures.  2.     The LV is not well visualized. Overall EF seems to be in the 35-40% range with dyskinesis of the mid to distal inferoseptal wall and entire apex. The anterior wall is never imaged well  3.     Right ventricular systolic function is normal. The right ventricular size is normal.  4. The mitral valve is normal in  structure. Mild mitral valve regurgitation. No evidence of mitral stenosis. Moderate mitral annular calcification.  5. The aortic valve is moderately calcified with fusion of the right and left coronary cusps. The aortic valve is tricuspid. There is moderate calcification of the aortic valve. Aortic valve regurgitation is trivial. Mild aortic valve stenosis. Aortic valve area, by VTI measures 1.65 cm. Aortic valve mean gradient measures 9.0 mmHg. Aortic valve Vmax measures 2.13 m/s.  6. The inferior vena cava is normal in size with greater than 50% respiratory variability, suggesting right atrial pressure of 3 mmHg.  7. Left  atrial size was moderately dilated.  8. Aortic dilatation noted. There is borderline dilatation of the aortic root, measuring 38 mm. FINDINGS  Left Ventricle: The LV is not well visualized. Overall EF seems to be in the 35-40% range with dyskinesis of the mid to distal inferoseptal wall and entire apex. The anterior wall is never imaged well. Left ventricular ejection fraction, by estimation, is 35 to 40%. The left ventricle has moderately decreased function. The left ventricle demonstrates regional wall motion abnormalities. The left ventricular internal cavity size was normal in size. There is mild concentric left ventricular hypertrophy. Left ventricular diastolic parameters are indeterminate. Right Ventricle: The right ventricular size is normal. No increase in right ventricular wall thickness. Right ventricular systolic function is normal. Left Atrium: Left atrial size was moderately dilated. Right Atrium: Right atrial size was normal in size. Pericardium: There is no evidence of pericardial effusion. Mitral Valve: The mitral valve is normal in structure. Moderate mitral annular calcification. Mild mitral valve regurgitation. No evidence of mitral valve stenosis. MV peak gradient, 9.6 mmHg. The mean mitral valve gradient is 4.0 mmHg. Tricuspid Valve: The tricuspid valve is normal in  structure. Tricuspid valve regurgitation is trivial. No evidence of tricuspid stenosis. Aortic Valve: The aortic valve is moderately calcified with fusion of the right and left coronary cusps. The aortic valve is tricuspid. There is moderate calcification of the aortic valve. Aortic valve regurgitation is trivial. Mild aortic stenosis is present. Aortic valve mean gradient measures 9.0 mmHg. Aortic valve peak gradient measures 18.1 mmHg. Aortic valve area, by VTI measures 1.65 cm. Pulmonic Valve: The pulmonic valve was normal in structure. Pulmonic valve regurgitation is not visualized. No evidence of pulmonic stenosis. Aorta: Aortic dilatation noted. There is borderline dilatation of the aortic root, measuring 38 mm. Venous: The inferior vena cava is normal in size with greater than 50% respiratory variability, suggesting right atrial pressure of 3 mmHg. IAS/Shunts: No atrial level shunt detected by color flow Doppler.  LEFT VENTRICLE PLAX 2D LVIDd:         4.60 cm   Diastology LVIDs:         3.40 cm   LV e' lateral:   9.90 cm/s LV PW:         1.10 cm   LV E/e' lateral: 12.4 LV IVS:        1.10 cm LVOT diam:     2.20 cm LV SV:         55 LV SV Index:   31 LVOT Area:     3.80 cm  RIGHT VENTRICLE            IVC RV S prime:     7.71 cm/s  IVC diam: 1.50 cm TAPSE (M-mode): 1.5 cm LEFT ATRIUM           Index        RIGHT ATRIUM           Index LA diam:      4.20 cm 2.34 cm/m   RA Area:     12.90 cm LA Vol (A4C): 87.3 ml 48.65 ml/m  RA Volume:   27.10 ml  15.10 ml/m  AORTIC VALVE AV Area (Vmax):    1.90 cm AV Area (Vmean):   1.81 cm AV Area (VTI):     1.65 cm AV Vmax:           212.67 cm/s AV Vmean:          135.667 cm/s AV VTI:  0.333 m AV Peak Grad:      18.1 mmHg AV Mean Grad:      9.0 mmHg LVOT Vmax:         106.25 cm/s LVOT Vmean:        64.600 cm/s LVOT VTI:          0.144 m LVOT/AV VTI ratio: 0.43  AORTA Ao Root diam: 3.30 cm Ao Asc diam:  3.75 cm MITRAL VALVE                TRICUSPID VALVE MV  Area VTI:  1.91 cm      TR Peak grad:   25.4 mmHg MV Peak grad: 9.6 mmHg      TR Vmax:        252.00 cm/s MV Mean grad: 4.0 mmHg MV Vmax:      1.55 m/s      SHUNTS MV Vmean:     91.7 cm/s     Systemic VTI:  0.14 m MV E velocity: 123.00 cm/s  Systemic Diam: 2.20 cm Arvilla Meres MD Electronically signed by Arvilla Meres MD Signature Date/Time: 11/09/2022/5:04:46 PM    Final    DG Chest Port 1 View  Result Date: 11/07/2022 CLINICAL DATA:  Hypoxia EXAM: PORTABLE CHEST 1 VIEW COMPARISON:  11/06/2022 FINDINGS: Cardiac shadow is enlarged but stable. Postsurgical changes are again seen. Lungs demonstrate increased interstitial markings consistent with edema. No sizable effusion is noted. No pneumothorax is seen. No bony abnormality is noted. IMPRESSION: Increasing interstitial edema. Electronically Signed   By: Alcide Clever M.D.   On: 11/07/2022 22:16   DG Chest Port 1 View  Result Date: 11/06/2022 CLINICAL DATA:  Chest pain and dyspnea EXAM: PORTABLE CHEST 1 VIEW COMPARISON:  02/02/2022 chest radiograph. FINDINGS: Stable mid to lower sternotomy wire discontinuities. CABG clips overlie the mediastinum. Stable cardiomediastinal silhouette with top-normal heart size. No pneumothorax. No pleural effusion. No acute consolidative airspace disease. No overt pulmonary edema. Chronic coarsened interstitial and patchy reticular opacities throughout both lungs. IMPRESSION: No acute cardiopulmonary disease. Chronic coarsened interstitial and patchy reticular opacities throughout both lungs, favor nonspecific scarring versus chronic interstitial lung disease. Electronically Signed   By: Delbert Phenix M.D.   On: 11/06/2022 08:49     Discharge Exam: Vitals:   11/26/22 0856 11/26/22 1143  BP:  105/68  Pulse:  93  Resp:  16  Temp:  97.9 F (36.6 C)  SpO2: 94% 97%   Vitals:   11/26/22 0815 11/26/22 0838 11/26/22 0856 11/26/22 1143  BP: 119/76 115/71  105/68  Pulse:  91  93  Resp:  16  16  Temp:  97.6 F (36.4  C)  97.9 F (36.6 C)  TempSrc:  Oral  Oral  SpO2:  90% 94% 97%  Weight:      Height:        General: Pt is alert, awake, not in acute distress Cardiovascular: RRR, S1/S2 +, no rubs, no gallops Respiratory: Chronic rhonchi bilaterally. Abdominal: Soft, NT, ND, bowel sounds + Extremities: no edema, no cyanosis    The results of significant diagnostics from this hospitalization (including imaging, microbiology, ancillary and laboratory) are listed below for reference.     Microbiology: Recent Results (from the past 240 hour(s))  SARS Coronavirus 2 by RT PCR (hospital order, performed in [facility] hospital lab) *cepheid single result test* Anterior Nasal Swab     Status: None   Collection Time: 11/23/22 11:18 PM   Specimen: Anterior Nasal Swab  Result  Value Ref Range Status   SARS Coronavirus 2 by RT PCR NEGATIVE NEGATIVE Final    Comment: Performed at [facility], [address], [place], [place] [address]  MRSA Next Gen by PCR, Nasal     Status: Abnormal   Collection Time: 11/25/22 11:24 AM   Specimen: Nasal Mucosa; Nasal Swab  Result Value Ref Range Status   MRSA by PCR Next Gen DETECTED (A) NOT DETECTED Final    Comment: RESULT CALLED TO, READ BACK BY AND VERIFIED WITH: Lywith Performed at [facility], [address] [place], [place] [address]      Labs: BNP (last 3 results) Recent Labs    11/07/22 2332 11/11/22 2159 11/23/22 1930  BNP 785.2* 396.5* 993.2*   Basic Metabolic Panel: Recent Labs  Lab 11/23/22 1930 11/23/22 2352 11/24/22 1025 11/25/22 0047 11/26/22 1104  NA 134* 135 136 135 139  K 4.5 3.9 3.9 3.1* 3.9  CL 96* 99 93* 92* 92*  CO2 26 23 29 31  32  GLUCOSE 203* 153* 290* 125* 249*  BUN 16 15 13 13 12   CREATININE 1.08* 1.07* 1.14* 1.25* 1.21*  CALCIUM 9.0 8.8* 8.9 8.7* 9.7  MG  --  2.0  --   --   --    Liver Function Tests: Recent Labs  Lab 11/19/22 1511  AST 21  ALT 14  ALKPHOS 98  BILITOT 0.8  PROT 7.4  ALBUMIN 2.9*    No results for input(s): "LIPASE", "AMYLASE" in the last 168 hours. No results for input(s): "AMMONIA" in the last 168 hours. CBC: Recent Labs  Lab 11/19/22 1511 11/23/22 1930 11/23/22 2352 11/25/22 0047  WBC 5.6 5.7 5.6 8.0  NEUTROABS 4.2 4.1  --   --   HGB 10.9* 9.1* 9.4* 10.0*  HCT 34.2* 28.7* 31.0* 31.0*  MCV 97.2 94.4 99.0 92.0  PLT 356 339 361 363   Cardiac Enzymes: No results for input(s): "CKTOTAL", "CKMB", "CKMBINDEX", "TROPONINI" in the last 168 hours. BNP: Invalid input(s): "POCBNP" CBG: Recent Labs  Lab 11/25/22 1614 11/25/22 2145 11/25/22 2358 11/26/22 0629 11/26/22 1140  GLUCAP 187* 323* 270* 117* 237*   D-Dimer Recent Labs    11/26/22 1104  DDIMER 1.64*   Hgb A1c No results for input(s): "HGBA1C" in the last 72 hours. Lipid Profile No results for input(s): "CHOL", "HDL", "LDLCALC", "TRIG", "CHOLHDL", "LDLDIRECT" in the last 72 hours. Thyroid function studies No results for input(s): "TSH", "T4TOTAL", "T3FREE", "THYROIDAB" in the last 72 hours.  Invalid input(s): "FREET3" Anemia work up No results for input(s): "VITAMINB12", "FOLATE", "FERRITIN", "TIBC", "IRON", "RETICCTPCT" in the last 72 hours. Urinalysis    Component Value Date/Time   COLORURINE YELLOW 11/20/2022 1834   APPEARANCEUR HAZY (A) 11/20/2022 1834   LABSPEC 1.014 11/20/2022 1834   PHURINE 6.0 11/20/2022 1834   GLUCOSEU >=500 (A) 11/20/2022 1834   GLUCOSEU 250 (A) 06/16/2022 1559   HGBUR NEGATIVE 11/20/2022 1834   BILIRUBINUR NEGATIVE 11/20/2022 1834   KETONESUR NEGATIVE 11/20/2022 1834   PROTEINUR NEGATIVE 11/20/2022 1834   UROBILINOGEN 0.2 06/16/2022 1559   NITRITE NEGATIVE 11/20/2022 1834   LEUKOCYTESUR NEGATIVE 11/20/2022 1834   Sepsis Labs Recent Labs  Lab 11/19/22 1511 11/23/22 1930 11/23/22 2352 11/25/22 0047  WBC 5.6 5.7 5.6 8.0   Microbiology Recent Results (from the past 240 hour(s))  SARS Coronavirus 2 by RT PCR (hospital order, performed in [facility]  hospital lab) *cepheid single result test* Anterior Nasal Swab     Status: None  Collection Time: 11/23/22 11:18 PM   Specimen: Anterior Nasal Swab  Result Value Ref Range Status   SARS Coronavirus 2 by RT PCR NEGATIVE NEGATIVE Final    Comment: Performed at [facility] Lab, [address], [place], [place] [address]  MRSA Next Gen by PCR, Nasal     Status: Abnormal   Collection Time: 11/25/22 11:24 AM   Specimen: Nasal Mucosa; Nasal Swab  Result Value Ref Range Status   MRSA by PCR Next Gen DETECTED (A) NOT DETECTED Final    Comment: RESULT CALLED TO, READ BACK BY AND VERIFIED WITH: Lywith Performed at [facility], [address] [place], [place] [address]      Time coordinating discharge: Over 30 minutes  SIGNED:   Hughie Closs, MD  [facility] 11/26/2022, 12:51 PM *Please note that this is a verbal dictation therefore any spelling or grammatical errors are due to the "Dragon Medical One" system interpretation. If 7PM-7AM, please contact night-coverage [email or web address]

## 2022-11-26 NOTE — Plan of Care (Signed)

## 2022-11-26 NOTE — Care Management Important Message (Signed)
Important Message  Patient Details  Name: Katherine [name] MRN: [id number] Date of Birth: 04-27-1956   Medicare Important Message Given:  Yes     Renie Ora 11/26/2022, 2:13 PM

## 2022-11-26 NOTE — TOC Initial Note (Addendum)
Transition of Care [facility]) - Initial/Assessment Note    Patient Details  Name: Katherine [name] MRN: [id number] Date of Birth: [birth year]  [facility] of [facility] CM/SW Contact:    Leone Haven, RN Phone Number: 11/26/2022, 12:46 PM  Clinical Narrative:                 Patient is a readmit, NCM spoke with her at bedside, per pt eval rec HHPT, she went to SNF previously and not back in hospital.  NCM offered choice, she is active with [facility] for [facility], HHPT, will add SW to [facility] orders.NCM confirmed with [name], soc will begin 24 to 48 hrs post dc.   Patient states she will need PTAR  transport schedule for 2 pm. Patient states she is calling her niece Charlton Amor and her neighbor , Alexia Freestone so they can be there today when she get there by PTAR.    Expected Discharge Plan: Skilled Nursing Facility Barriers to Discharge: Continued Medical Work up   Patient Goals and CMS Choice Patient states their goals for this hospitalization and ongoing recovery are:: home with [facility] CMS Medicare.gov Compare Post Acute Care list provided to:: Patient Choice offered to / list presented to : Patient      Expected Discharge Plan and Services In-house Referral: NA Discharge Planning Services: CM Consult Post Acute Care Choice: Home Health Living arrangements for the past 2 months: Single Family Home                 DME Arranged: N/A DME Agency: NA       HH Arranged: RN, Disease Management, PT, Social Work [employer] Agency: [employer], [employer] Home Health Date HH Agency Contacted: 11/26/22 Time HH Agency Contacted: [phone] Representative spoke with at [facility] Agency: [name]  Prior Living Arrangements/Services Living arrangements for the past 2 months: Single Family Home Lives with:: Self Patient language and need for interpreter reviewed:: Yes Do you feel safe going back to the place where you live?: Yes      Need for Family Participation in Patient Care: Yes (Comment) Care giver support system in place?: No  (comment) Current home services:  (w/chair,cane , walker, bsc and oxygen) Criminal Activity/Legal Involvement Pertinent to Current Situation/Hospitalization: No - Comment as needed  Activities of Daily Living Home Assistive Devices/Equipment: Bedside commode/3-in-1 ADL Screening (condition at time of admission) Patient's cognitive ability adequate to safely complete daily activities?: Yes Is the patient deaf or have difficulty hearing?: Yes Does the patient have difficulty seeing, even when wearing glasses/contacts?: Yes Does the patient have difficulty concentrating, remembering, or making decisions?: No Patient able to express need for assistance with ADLs?: Yes Does the patient have difficulty dressing or bathing?: Yes Independently performs ADLs?: No Does the patient have difficulty walking or climbing stairs?: Yes Weakness of Legs: Both Weakness of Arms/Hands: Both  Permission Sought/Granted         Permission granted to share info w AGENCY: [facility]        Emotional Assessment Appearance:: Appears stated age Attitude/Demeanor/Rapport: Engaged Affect (typically observed): Appropriate Orientation: : Oriented to Self, Oriented to Place, Oriented to  Time, Oriented to Situation Alcohol / Substance Use: Not Applicable Psych Involvement: No (comment)  Admission diagnosis:  Acute respiratory distress [R06.03] Patient Active Problem List   Diagnosis Date Noted   Acute respiratory distress 11/23/2022   Dyspnea on exertion 11/12/2022   Hypotension 11/12/2022   Pressure injury of skin 11/07/2022   Chest pain 11/06/2022   Acute upper respiratory infection 10/14/2022  Acute cystitis without hematuria 06/30/2022   Diuretic-induced hypokalemia 06/17/2022   Insulin-requiring or dependent type II diabetes mellitus 06/17/2022   Mass of skin of left forearm 06/16/2022   Spondylosis of lumbar region without myelopathy or radiculopathy 12/19/2021   Encounter for palliative care  involving management of pain 12/19/2021   COPD with acute exacerbation 12/07/2021   CHF exacerbation 10/18/2021   Goals of care, counseling/discussion 10/03/2021   Class 2 obesity 09/25/2021   Rest pain of lower extremity due to atherosclerosis 09/02/2021   Longstanding persistent atrial fibrillation    Chronic combined systolic and diastolic heart failure 08/06/2021   Obesity (BMI 30-39.9) 08/05/2021   Polymorphic ventricular tachycardia    Bradycardia    COPD (chronic obstructive pulmonary disease)    Acute kidney injury superimposed on CKD    History of renal transplant    Coronary stent restenosis due to scar tissue    Chronic respiratory failure with hypoxia 01/01/2021   Candidal skin infection 08/13/2020   Chronic bronchitis, mucopurulent 08/24/2019   Current chronic use of systemic steroids 07/23/2019   OSA (obstructive sleep apnea)    Severe episode of recurrent major depressive disorder, with psychotic features 02/16/2019   Obstructive chronic bronchitis without exacerbation 11/03/2018   Long term (current) use of antithrombotics/antiplatelets 04/28/2018   LBBB (left bundle branch block) 10/07/2017   Primary osteoarthritis of right knee 08/18/2017   Ovarian cyst 05/18/2017   Mild aortic stenosis by prior echocardiogram 01/28/2017   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   Normocytic anemia 10/13/2016   Fibromyalgia 03/30/2016   Other spondylosis with radiculopathy, lumbar region 03/30/2016   Type 2 diabetes mellitus with hyperlipidemia 03/30/2016   Paroxysmal atrial fibrillation (HCC); CHA2DS2VASc score F, HTN, CAD, CVA = 5 06/28/2014   Debility 06/28/2014   CAD S/P percutaneous coronary angioplasty - PCI x 5 to SVG-D1 09/11/2013    Class: Diagnosis of   Essential hypertension    PAD (peripheral artery disease) (HCC) 08/17/2013   CAD (coronary artery disease) of bypass graft 10/08/2012   Stenosis of right carotid artery without infarction 10/08/2012   Dyslipidemia, goal LDL  below 70 10/08/2012    Class: Diagnosis of   Mitral annular calcification 10/08/2012   Renal transplant disorder 06/12/2012   Chronic allergic rhinitis 04/29/2011   Extrinsic asthma 09/09/2007   GERD 09/09/2007   Morbid obesity - s/p Lap Band 9/'05 05/07/2004    Class: Diagnosis of   PCP:  Etta GrandchildJones, Thomas L, MD Pharmacy:   [id number] [place] - [address] [place] [id number] Phone: [phone] Fax: [phone]  [facility] - [place] - [address] [place] [id number] Phone: [phone] Fax: [phone]  [facility] [address] [place] [id number] Phone: [phone] Fax: [phone]     Social Determinants of Health (SDOH) Social History: SDOH Screenings   Food Insecurity: No Food Insecurity (11/24/2022)  Housing: Low Risk  (11/24/2022)  Transportation Needs: Unmet Transportation Needs (11/24/2022)  Utilities: At Risk (11/24/2022)  Alcohol Screen: Low Risk  (12/10/2021)  Depression (PHQ2-9): Medium Risk (02/02/2022)  Tobacco Use: Medium Risk (11/23/2022)   SDOH Interventions: Utilities Interventions: Other (Comment)   Readmission Risk Interventions    11/26/2022   12:36 PM 11/23/2022   12:14 PM 11/09/2022    2:22 PM  Readmission Risk Prevention Plan  Transportation Screening Complete Complete Complete  Medication Review (RN Care Manager) Complete  Referral to Pharmacy  PCP or  Specialist appointment within 3-5 days of discharge Complete    HRI or Home Care Consult Complete  Complete  SW Recovery Care/Counseling Consult   Complete  Palliative Care Screening Not Applicable  Not Applicable  Skilled Nursing Facility Not Applicable Complete Patient Refused

## 2022-11-27 ENCOUNTER — Telehealth: Payer: Self-pay | Admitting: Internal Medicine

## 2022-11-27 NOTE — Telephone Encounter (Signed)
Cara from [facility] had to reschedule home healthcare for the patient from 11/27/22 to 11/30/22 at patient's request. She left a callback number of [phone](secure).

## 2022-11-28 ENCOUNTER — Emergency Department (HOSPITAL_COMMUNITY)
Admission: EM | Admit: 2022-11-28 | Discharge: 2022-11-28 | Disposition: A | Discharge status: Home / Self Care | Payer: Medicare Other | Attending: Emergency Medicine | Admitting: Emergency Medicine

## 2022-11-28 ENCOUNTER — Other Ambulatory Visit: Payer: Self-pay

## 2022-11-28 ENCOUNTER — Encounter (HOSPITAL_COMMUNITY): Payer: Self-pay | Admitting: Emergency Medicine

## 2022-11-28 ENCOUNTER — Emergency Department (HOSPITAL_COMMUNITY): Payer: Medicare Other

## 2022-11-28 DIAGNOSIS — Z79899 Other long term (current) drug therapy: Secondary | ICD-10-CM | POA: Diagnosis not present

## 2022-11-28 DIAGNOSIS — Z940 Kidney transplant status: Secondary | ICD-10-CM | POA: Insufficient documentation

## 2022-11-28 DIAGNOSIS — N186 End stage renal disease: Secondary | ICD-10-CM | POA: Diagnosis not present

## 2022-11-28 DIAGNOSIS — R2243 Localized swelling, mass and lump, lower limb, bilateral: Secondary | ICD-10-CM | POA: Diagnosis not present

## 2022-11-28 DIAGNOSIS — I132 Hypertensive heart and chronic kidney disease with heart failure and with stage 5 chronic kidney disease, or end stage renal disease: Secondary | ICD-10-CM | POA: Diagnosis not present

## 2022-11-28 DIAGNOSIS — I480 Paroxysmal atrial fibrillation: Secondary | ICD-10-CM | POA: Insufficient documentation

## 2022-11-28 DIAGNOSIS — Z87891 Personal history of nicotine dependence: Secondary | ICD-10-CM | POA: Diagnosis not present

## 2022-11-28 DIAGNOSIS — I509 Heart failure, unspecified: Secondary | ICD-10-CM | POA: Diagnosis not present

## 2022-11-28 DIAGNOSIS — Z7984 Long term (current) use of oral hypoglycemic drugs: Secondary | ICD-10-CM | POA: Diagnosis not present

## 2022-11-28 DIAGNOSIS — J441 Chronic obstructive pulmonary disease with (acute) exacerbation: Secondary | ICD-10-CM | POA: Insufficient documentation

## 2022-11-28 DIAGNOSIS — E1122 Type 2 diabetes mellitus with diabetic chronic kidney disease: Secondary | ICD-10-CM | POA: Insufficient documentation

## 2022-11-28 DIAGNOSIS — I2510 Atherosclerotic heart disease of native coronary artery without angina pectoris: Secondary | ICD-10-CM | POA: Insufficient documentation

## 2022-11-28 DIAGNOSIS — Z7901 Long term (current) use of anticoagulants: Secondary | ICD-10-CM | POA: Insufficient documentation

## 2022-11-28 DIAGNOSIS — R0789 Other chest pain: Secondary | ICD-10-CM | POA: Diagnosis not present

## 2022-11-28 DIAGNOSIS — Z7951 Long term (current) use of inhaled steroids: Secondary | ICD-10-CM | POA: Diagnosis not present

## 2022-11-28 DIAGNOSIS — Z794 Long term (current) use of insulin: Secondary | ICD-10-CM | POA: Diagnosis not present

## 2022-11-28 LAB — CBC WITH DIFFERENTIAL/PLATELET
Abs Immature Granulocytes: 0.02 10*3/uL (ref 0.00–0.07)
Basophils Absolute: 0 10*3/uL (ref 0.0–0.1)
Basophils Relative: 0 %
Eosinophils Absolute: 0.1 10*3/uL (ref 0.0–0.5)
Eosinophils Relative: 2 %
HCT: 32.5 % — ABNORMAL LOW (ref 36.0–46.0)
Hemoglobin: 9.9 g/dL — ABNORMAL LOW (ref 12.0–15.0)
Immature Granulocytes: 0 %
Lymphocytes Relative: 17 %
Lymphs Abs: 0.9 10*3/uL (ref 0.7–4.0)
MCH: 28.9 pg (ref 26.0–34.0)
MCHC: 30.5 g/dL (ref 30.0–36.0)
MCV: 94.8 fL (ref 80.0–100.0)
Monocytes Absolute: 0.7 10*3/uL (ref 0.1–1.0)
Monocytes Relative: 13 %
Neutro Abs: 3.5 10*3/uL (ref 1.7–7.7)
Neutrophils Relative %: 68 %
Platelets: 350 10*3/uL (ref 150–400)
RBC: 3.43 MIL/uL — ABNORMAL LOW (ref 3.87–5.11)
RDW: 18.7 % — ABNORMAL HIGH (ref 11.5–15.5)
WBC: 5.2 10*3/uL (ref 4.0–10.5)
nRBC: 0 % (ref 0.0–0.2)

## 2022-11-28 LAB — I-STAT VENOUS BLOOD GAS, ED
Acid-Base Excess: 10 mmol/L — ABNORMAL HIGH (ref 0.0–2.0)
Bicarbonate: 33.4 mmol/L — ABNORMAL HIGH (ref 20.0–28.0)
Calcium, Ion: 1.1 mmol/L — ABNORMAL LOW (ref 1.15–1.40)
HCT: 31 % — ABNORMAL LOW (ref 36.0–46.0)
Hemoglobin: 10.5 g/dL — ABNORMAL LOW (ref 12.0–15.0)
O2 Saturation: 86 %
Potassium: 4 mmol/L (ref 3.5–5.1)
Sodium: 137 mmol/L (ref 135–145)
TCO2: 35 mmol/L — ABNORMAL HIGH (ref 22–32)
pCO2, Ven: 37.6 mmHg — ABNORMAL LOW (ref 44–60)
pH, Ven: 7.557 — ABNORMAL HIGH (ref 7.25–7.43)
pO2, Ven: 44 mmHg (ref 32–45)

## 2022-11-28 LAB — COMPREHENSIVE METABOLIC PANEL
ALT: 13 U/L (ref 0–44)
AST: 21 U/L (ref 15–41)
Albumin: 2.9 g/dL — ABNORMAL LOW (ref 3.5–5.0)
Alkaline Phosphatase: 97 U/L (ref 38–126)
Anion gap: 16 — ABNORMAL HIGH (ref 5–15)
BUN: 11 mg/dL (ref 8–23)
CO2: 27 mmol/L (ref 22–32)
Calcium: 9.3 mg/dL (ref 8.9–10.3)
Chloride: 94 mmol/L — ABNORMAL LOW (ref 98–111)
Creatinine, Ser: 1.19 mg/dL — ABNORMAL HIGH (ref 0.44–1.00)
GFR, Estimated: 51 mL/min — ABNORMAL LOW (ref >60)
Glucose, Bld: 226 mg/dL — ABNORMAL HIGH (ref 70–99)
Potassium: 3.8 mmol/L (ref 3.5–5.1)
Sodium: 137 mmol/L (ref 135–145)
Total Bilirubin: 1 mg/dL (ref 0.3–1.2)
Total Protein: 6.7 g/dL (ref 6.5–8.1)

## 2022-11-28 LAB — BRAIN NATRIURETIC PEPTIDE: B Natriuretic Peptide: 724.7 pg/mL — ABNORMAL HIGH (ref 0.0–100.0)

## 2022-11-28 LAB — TROPONIN I (HIGH SENSITIVITY)
Troponin I (High Sensitivity): 28 ng/L — ABNORMAL HIGH (ref <18)
Troponin I (High Sensitivity): 27 ng/L — ABNORMAL HIGH (ref <18)

## 2022-11-28 MED ORDER — LORAZEPAM 1 MG PO TABS
1.0000 mg | ORAL_TABLET | Freq: Once | ORAL | Status: AC
  Administered 2022-11-28: 1 mg via ORAL
  Filled 2022-11-28: qty 1

## 2022-11-28 MED ORDER — OXYCODONE HCL 5 MG PO TABS
10.0000 mg | ORAL_TABLET | Freq: Once | ORAL | Status: AC
  Administered 2022-11-28: 10 mg via ORAL
  Filled 2022-11-28: qty 2

## 2022-11-28 MED ORDER — OXYCODONE-ACETAMINOPHEN 5-325 MG PO TABS
2.0000 | ORAL_TABLET | Freq: Once | ORAL | Status: AC
  Administered 2022-11-28: 2 via ORAL
  Filled 2022-11-28: qty 2

## 2022-11-28 MED ORDER — FUROSEMIDE 10 MG/ML IJ SOLN: 40.0000 mg | Freq: Once | INTRAMUSCULAR | Status: DC

## 2022-11-28 NOTE — Discharge Instructions (Addendum)
We evaluated you for your chest pain and shortness of breath.  Your testing in the emergency department was reassuring and stable from your recent hospitalizations.  Your cardiac enzymes were stable and did not show signs of a heart attack.  Your EKG was reassuring.  We discussed hospitalization for further rehab and possible inpatient rehab but you preferred to go home.  You can return to the hospital at any time if you have any worsening or recurrent symptoms such as ongoing chest pain, difficulty breathing, fevers or chills, abdominal pain, cough, headaches, worsening leg swelling, or any other new symptoms.  You have a hospital follow-up appointment with your primary doctor on Monday.  Please do not miss this appointment.  Also placed another referral to cardiology so that you can see them soon.

## 2022-11-28 NOTE — ED Triage Notes (Signed)
Pt to ER via EMS with reports of being woken from sleep with CP.  Cp is in center of chest and radiates out to bilateral shoulders.  Pt wears 7L [place] at home, per EMS pt sats in mid 80s on same.  Pt was placed on 10L NRB and sats improved to mid 90s.  Pt placed back on [place] on arrival to ER.  Pt was given 324mg  ASA and 1 SL NTG per EMS with some relief.

## 2022-11-28 NOTE — ED Notes (Signed)
Got patient undressed into a gown on the monitor did EKG shown to er provider patient is resting with call bell in reach 

## 2022-11-28 NOTE — ED Notes (Signed)
Ptar called unable to give pick up time 

## 2022-11-28 NOTE — ED Provider Notes (Signed)
[place] EMERGENCY DEPARTMENT AT [facility] Provider Note  CSN: [id number] Arrival date & time: 11/28/22 0623  Chief Complaint(s) Chest Pain  HPI Katherine [name] is a 67 y.o. female history of coronary artery disease, COPD on chronic 7 L home oxygen, hypertension, lipidemia, diabetes, prior renal transplant presenting to the emergency department with chest pain.  Patient reports she woke up from her sleep with chest pain.  Reports it was in the middle of her chest and both shoulders.  Relieved with nitroglycerin by EMS.  When EMS arrived they found that she was apparently hypoxic on 7 L of briefly placed on 10 L nonrebreather.  In the emergency department she is back on 7 L.  She denies any ongoing pain.  She denies any fevers or chills.  History of some shortness of breath which is chronic.  She reports chronic leg swelling which is not significantly different.  She reports compliance with all of her medications while she was in the hospital.  The chest pain does not radiate to the back.  No lightheadedness, dizziness or fainting.   Past Medical History Past Medical History:  Diagnosis Date   Anemia    Anxiety    Bilateral carotid artery stenosis    Carotid duplex 02/6282: 1-39% LICA, 60-79% RICA, >50% RECA, f/u 1 yr suggested   CAD (coronary artery disease) of bypass graft 5/01; 3/'02, 8/'03, 10/'04; 1/15   PCI x 5 to SVG-D1    CAD in native artery 07/1993   3 Vessel Disease (LAD-D1 & RCA) -- CABG (Dx in setting of inferior STEMI-PTCA of RCA)   COPD mixed type    Followed by Dr. Delton Coombes "pulmonologist said no COPD"   Depression with anxiety    Diabetes mellitus type 2 in obese    Diarrhea    started after cholecystectomy and mass removed from intestine   Dyslipidemia, goal LDL below 70    08/2012: TC 137, TG 200, HDL 32!, LDL 45; on statin (followed by Dr.Deterding)   ESRD (end stage renal disease) 1991   s/p Cadaveric Renal Transplant [facility] - Dr. 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  Had having having lots of nausea and vomiting with poor p.o. intake.  Thought to have QT prolongation with multiple medications involved and hypomagnesemia, hypokalemia.  Tikosyn discontinued along with Zoloft and Phenergan.   Hypertension associated with diabetes    Mild aortic stenosis by prior echocardiogram 07/2019   Echo:  Mild aortic stenosis (gradients: Mean 14.3 mmHg -peak 24.9 mmHg).   Morbid obesity    MRSA (methicillin resistant staph aureus) culture positive    OSA (obstructive sleep apnea)    no longer on CPAP or home O2, states she doesn't need now after lap band   PAD (peripheral artery disease) 08/2013   LEA Dopplers to be read by Dr. Kirke Corin   PAF (paroxysmal atrial fibrillation) 06/2014   Noted on CardioNet Monitor  - --> rhythm control with Tikosyn (Dr. Johney Frame); converted from warfarin to apixaban for anticoagulation.   Pneumonia  Recurrent boils    Bilateral Groin   Rheumatoid arthritis    Per Patient Report; associated with OA   S/p cadaver renal transplant 1991   [facility]   Patient Active Problem List   Diagnosis Date Noted   Acute respiratory distress 11/23/2022   Dyspnea on exertion 11/12/2022   Hypotension 11/12/2022   Pressure injury of skin 11/07/2022   Chest pain 11/06/2022   Acute upper respiratory infection 10/14/2022   Acute cystitis without hematuria 06/30/2022   Diuretic-induced hypokalemia 06/17/2022   Insulin-requiring or dependent type II diabetes mellitus 06/17/2022   Mass of skin of left forearm 06/16/2022   Spondylosis of lumbar  region without myelopathy or radiculopathy 12/19/2021   Encounter for palliative care involving management of pain 12/19/2021   COPD with acute exacerbation 12/07/2021   CHF exacerbation 10/18/2021   Goals of care, counseling/discussion 10/03/2021   Class 2 obesity 09/25/2021   Rest pain of lower extremity due to atherosclerosis 09/02/2021   Longstanding persistent atrial fibrillation    Chronic combined systolic and diastolic heart failure 08/06/2021   Obesity (BMI 30-39.9) 08/05/2021   Polymorphic ventricular tachycardia    Bradycardia    COPD (chronic obstructive pulmonary disease)    Acute kidney injury superimposed on CKD    History of renal transplant    Coronary stent restenosis due to scar tissue    Chronic respiratory failure with hypoxia 01/01/2021   Candidal skin infection 08/13/2020   Chronic bronchitis, mucopurulent 08/24/2019   Current chronic use of systemic steroids 07/23/2019   OSA (obstructive sleep apnea)    Severe episode of recurrent major depressive disorder, with psychotic features 02/16/2019   Obstructive chronic bronchitis without exacerbation 11/03/2018   Long term (current) use of antithrombotics/antiplatelets 04/28/2018   LBBB (left bundle branch block) 10/07/2017   Primary osteoarthritis of right knee 08/18/2017   Ovarian cyst 05/18/2017   Mild aortic stenosis by prior echocardiogram 01/28/2017   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   Normocytic anemia 10/13/2016   Fibromyalgia 03/30/2016   Other spondylosis with radiculopathy, lumbar region 03/30/2016   Type 2 diabetes mellitus with hyperlipidemia 03/30/2016   Paroxysmal atrial fibrillation (HCC); CHA2DS2VASc score F, HTN, CAD, CVA = 5 06/28/2014   Debility 06/28/2014   CAD S/P percutaneous coronary angioplasty - PCI x 5 to SVG-D1 09/11/2013    Class: Diagnosis of   Essential hypertension    PAD (peripheral artery disease) (HCC) 08/17/2013   CAD (coronary artery disease) of bypass graft 10/08/2012    Stenosis of right carotid artery without infarction 10/08/2012   Dyslipidemia, goal LDL below 70 10/08/2012    Class: Diagnosis of   Mitral annular calcification 10/08/2012   Renal transplant disorder 06/12/2012   Chronic allergic rhinitis 04/29/2011   Extrinsic asthma 09/09/2007   GERD 09/09/2007   Morbid obesity - s/p Lap Band 9/'05 05/07/2004    Class: Diagnosis of   Home Medication(s) Prior to Admission medications   Medication Sig Start Date End Date Taking? Authorizing Provider  albuterol (VENTOLIN HFA) 108 (90 Base) MCG/ACT inhaler TAKE 2 PUFFS BY MOUTH EVERY 6 HOURS AS NEEDED FOR WHEEZE OR SHORTNESS OF BREATH Patient taking differently: Inhale 2 puffs into the lungs every 6 (six) hours as needed for shortness of breath. 07/30/22   Leslye Peer, MD  arformoterol (BROVANA) 15 MCG/2ML NEBU Take 2 mLs (15 mcg total) by nebulization 2 (two) times daily. 08/30/19   Leslye Peer, MD  azaTHIOprine (IMURAN) 50 MG tablet Take 2 & 1/2 tablets (125 mg) by  mouth daily at 3pm Patient taking differently: Take 125 mg by mouth daily. 10/21/21   Pokhrel, Rebekah Chesterfield, MD  bisacodyl (DULCOLAX) 5 MG EC tablet Take 1 tablet (5 mg total) by mouth daily as needed for moderate constipation. 10/05/21   Drema Dallas, MD  budesonide (PULMICORT) 0.5 MG/2ML nebulizer solution Take 2 mLs (0.5 mg total) by nebulization 2 (two) times daily. Patient taking differently: Take 2 mLs by nebulization 2 (two) times daily as needed (wheezing). 08/24/19   [name], MD  calcitRIOL (ROCALTROL) 0.25 MCG capsule Take 0.25 mcg by mouth See admin instructions. Takes every 3 days.    [provider]  clopidogrel (PLAVIX) 75 MG tablet Take 1 tablet (75 mg total) by mouth daily. 11/11/22 01/10/23  Noralee Stain, DO  Continuous Blood Gluc Receiver (FREESTYLE LIBRE 2 READER) DEVI 1 Act by Does not apply route daily. 06/17/22   [name], MD  Continuous Blood Gluc Sensor (FREESTYLE LIBRE 2 SENSOR) MISC 1 Act by Does  not apply route daily. 06/17/22   [name], MD  Continuous Blood Gluc Sensor (FREESTYLE LIBRE 3 SENSOR) MISC 1 Units by Does not apply route 3 (three) times daily. Place 1 sensor on the skin every 14 days. Use to check glucose continuously 11/26/22   Hughie Closs, MD  [employer] VITAMIN C 500 MG tablet TAKE 1 TABLET (500 MG TOTAL) BY MOUTH DAILY. Patient taking differently: Take 500 mg by mouth daily. 02/03/22   [name], MD  cyclobenzaprine (FLEXERIL) 10 MG tablet TAKE 1 TABLET BY MOUTH TWICE A DAY AS NEEDED FOR MUSCLE SPASMS Patient taking differently: Take 10 mg by mouth daily as needed for muscle spasms. 09/14/22   [name], MD  ELIQUIS 5 MG TABS tablet TAKE 1 TABLET BY MOUTH TWICE A DAY 10/22/22   [name], MD  empagliflozin (JARDIANCE) 10 MG TABS tablet Take 1 tablet (10 mg total) by mouth daily. 11/11/22 01/10/23  Noralee Stain, DO  Ensure Max Protein (ENSURE MAX PROTEIN) LIQD Take 330 mLs (11 oz total) by mouth 2 (two) times daily. 12/12/21   Narda Bonds, MD  fluticasone (FLONASE) 50 MCG/ACT nasal spray PLACE 2 SPRAYS INTO BOTH NOSTRILS 2 TIMES DAILY. Patient taking differently: Place 2 sprays into both nostrils 2 (two) times daily. 08/04/22   [name], MD  furosemide (LASIX) 40 MG tablet Take 1 tablet (40 mg total) by mouth 2 (two) times daily. 11/26/22   Hughie Closs, MD  gabapentin (NEURONTIN) 100 MG capsule Take 1 capsule (100 mg total) by mouth at bedtime. Patient taking differently: Take 100 mg by mouth daily as needed (For pain). 09/11/21   Kathlen Mody, MD  glucose blood (GLUCOCOM TEST) test strip Use as instructed 11/26/22   Hughie Closs, MD  guaiFENesin-dextromethorphan (ROBITUSSIN DM) 100-10 MG/5ML syrup Take 15 mLs by mouth every 4 (four) hours as needed for cough. 11/23/22   Kathlen Mody, MD  hydrocortisone (ANUSOL-HC) 25 MG suppository Place 1 suppository (25 mg total) rectally 2 (two) times daily. 11/23/22   Kathlen Mody, MD  insulin glargine-yfgn  (SEMGLEE) 100 UNIT/ML injection Inject 0.08 mLs (8 Units total) into the skin daily. 11/24/22   Kathlen Mody, MD  Insulin Pen Needle (PEN NEEDLES) 32G X 4 MM MISC Use as needed to administer insulin. E11.9 02/01/22   [name], MD  isosorbide mononitrate (IMDUR) 30 MG 24 hr tablet Take 1 tablet (30 mg total) by mouth daily. 11/12/22   Almon Hercules,  MD  KLOR-CON M10 10 MEQ tablet TAKE 1 TABLET BY MOUTH 2 TIMES DAILY. 09/19/22   [name], MD  lamoTRIgine (LAMICTAL) 25 MG tablet TAKE 1 TABLET BY MOUTH EVERYDAY AT BEDTIME Patient taking differently: Take 25 mg by mouth daily. 02/19/22   Ranelle Oyster, MD  Lancets (FREESTYLE) lancets Use as directed up to four times daily 11/26/22   Hughie Closs, MD  metFORMIN (GLUCOPHAGE) 500 MG tablet Take 500 mg by mouth 2 (two) times daily. 12/08/21   [provider]  metoprolol tartrate (LOPRESSOR) 50 MG tablet Take 1 tablet (50 mg total) by mouth 2 (two) times daily. 11/10/22 01/09/23  Noralee Stain, DO  montelukast (SINGULAIR) 10 MG tablet TAKE 1 TABLET BY MOUTH EVERYDAY AT BEDTIME Patient taking differently: Take 10 mg by mouth at bedtime. 05/04/22   Leslye Peer, MD  Multiple Vitamin (MULTIVITAMIN WITH MINERALS) TABS tablet Take 1 tablet by mouth daily. 12/13/21   Narda Bonds, MD  nitroGLYCERIN (NITROSTAT) 0.4 MG SL tablet PLACE 1 TAB UNDER TONGUE EVERY FOR CHEST PAIN Patient taking differently: Place 0.4 mg under the tongue every 5 (five) minutes as needed for chest pain. 06/30/22   Marykay Lex, MD  pantoprazole (PROTONIX) 40 MG tablet TAKE 1 TABLET BY MOUTH EVERY DAY 08/05/22   [name], MD  predniSONE (DELTASONE) 5 MG tablet Take 5 mg by mouth daily with breakfast.    [provider]  promethazine (PHENERGAN) 25 MG tablet Take 1 tablet (25 mg total) by mouth every 6 (six) hours as needed for nausea or vomiting. 05/20/22   Loyola Mast, MD  ranolazine (RANEXA) 1000 MG SR tablet TAKE 1 TABLET BY MOUTH TWICE A  DAY 10/31/22   [name], MD  rosuvastatin (CRESTOR) 20 MG tablet TAKE 1 TABLET BY MOUTH EVERY DAY 07/29/22   Marykay Lex, MD  sertraline (ZOLOFT) 100 MG tablet Take 2 tablets (200 mg total) by mouth daily. 06/16/22   [name], MD  witch hazel-glycerin (TUCKS) pad Apply topically as needed for itching. 11/23/22   Kathlen Mody, MD                                                                                                                                    Past Surgical History Past Surgical History:  Procedure Laterality Date   ABDOMINAL AORTOGRAM N/A 04/21/2018   Procedure: ABDOMINAL AORTOGRAM;  Surgeon: Marykay Lex, MD;  Location: [facility] INVASIVE CV LAB;  Service: Cardiovascular;  Laterality: N/A;   CATHETER REMOVAL     CHOLECYSTECTOMY N/A 10/29/2014   Procedure: LAPAROSCOPIC CHOLECYSTECTOMY WITH INTRAOPERATIVE CHOLANGIOGRAM;  Surgeon: Glenna Fellows, MD;  Location: [facility] ORS;  Service: General;  Laterality: N/A;   CORONARY ANGIOPLASTY  1994   x5   CORONARY ARTERY BYPASS GRAFT  1995   LIMA-LAD, SVG-RPDA, SVG-D1   CORONARY PRESSURE/FFR STUDY N/A 02/20/2021   Procedure: INTRAVASCULAR  PRESSURE WIRE/FFR STUDY;  Surgeon: Marykay Lex, MD;  Location: [facility] INVASIVE CV LAB;  Service: Cardiovascular;  Laterality: N/A;   CORONARY STENT INTERVENTION N/A 02/20/2021   Procedure: PERCUTANEOUS CORONARY STENT INTERVENTION;  Surgeon: Marykay Lex, MD;  Location: [facility] INVASIVE CV LAB; ostLCx 60% (Neg RFR 0.96);; SVG- D2 recurrent 90% ISR & 95% native D2 after graft-> DES PCI of 95% anastomotic D2 lesion (Onyx Frontier 2.25 mm x 12 mm => 2.75 mm @ overlap, 2.5 distal.);PTCA of ISR in body of graft. ->  2.5 mm scoring balloon & post-dil w/ 2.75 mm Lares balloon   ESOPHAGOGASTRODUODENOSCOPY N/A 10/15/2016   Procedure: ESOPHAGOGASTRODUODENOSCOPY (EGD);  Surgeon: Charlott Rakes, MD;  Location: [facility] ENDOSCOPY;  Service: Endoscopy;  Laterality: N/A;   I & D EXTREMITY Right 01/29/2018    Procedure: IRRIGATION AND DEBRIDEMENT THUMB;  Surgeon: Knute Neu, MD;  Location: [facility] OR;  Service: Plastics;  Laterality: Right;   INCISE AND DRAIN ABCESS     KIDNEY TRANSPLANT  1991   KNEE ARTHROSCOPY WITH LATERAL MENISECTOMY Left 12/03/2017   Procedure: LEFT KNEE ARTHROSCOPY WITH LATERAL MENISECTOMY;  Surgeon: Frederico Hamman, MD;  Location: [facility] OR;  Service: Orthopedics;  Laterality: Left;   LAPAROSCOPIC GASTRIC BANDING  04/2004; 10/'09, 2/'10   Port Replacement x 2   LEFT HEART CATH AND CORONARY ANGIOGRAPHY N/A 02/20/2021   Procedure: LEFT HEART CATH AND CORONARY ANGIOGRAPHY;  Surgeon: Marykay Lex, MD;  Location: [facility] INVASIVE CV LAB;  Service: Cardiovascular;  Laterality: N/A;   LEFT HEART CATH AND CORS/GRAFTS ANGIOGRAPHY N/A 04/21/2018   Procedure: LEFT HEART CATH AND CORS/GRAFTS ANGIOGRAPHY;  Surgeon: Marykay Lex, MD;  Location: [facility] INVASIVE CV LAB;  Ost-Prox LAD 50% - proxLAD (pre & post D1) 100% CTO. Cx - patent, small OM1 (stable ~ ostial OM1 90%, too small for PCI) & 2 small LPL; Ost-distal RCA 100% CTO.  LIMA-LAD (not injected); SVG-dRCA patent, SVG-D1 - insertion stent ~20% ISR - Severe R CFA disease w/ focal Sub TO   LEFT HEART CATH AND CORS/GRAFTS ANGIOGRAPHY  5/'01, 3/'02, 8/'03, 10/'04; 1/'15   08/22/2013: LAD & RCA 100%; LIMA-LAD & SVG-rPDA patent; Cx-- OM1 60%, OM2 ostial ~50%; SVG-D1 - 80% mid, 50% distal ISR --PCI   LEFT HEART CATH AND CORS/GRAFTS ANGIOGRAPHY N/A 01/31/2021   Procedure: LEFT HEART CATH AND CORS/GRAFTS ANGIOGRAPHY;  Surgeon: Dolores Patty, MD;  Location: [facility] INVASIVE CV LAB;;   LEFT HEART CATH AND CORS/GRAFTS ANGIOGRAPHY N/A 09/05/2021   Procedure: LEFT HEART CATH AND CORS/GRAFTS ANGIOGRAPHY;  Surgeon: Marykay Lex, MD;  Location: [facility] INVASIVE CV LAB;  Service: Cardiovascular;  Laterality: N/A;   LEFT HEART CATH AND CORS/GRAFTS ANGIOGRAPHY N/A 08/21/2021   Procedure: LEFT HEART CATH AND CORS/GRAFTS ANGIOGRAPHY;  Surgeon: Lyn Records, MD;   Location: [facility] INVASIVE CV LAB;  Service: Cardiovascular;  Laterality: N/A;   LEFT HEART CATHETERIZATION WITH CORONARY/GRAFT ANGIOGRAM N/A 08/23/2013   Procedure: LEFT HEART CATHETERIZATION WITH Isabel Caprice;  Surgeon: Iran Ouch, MD;  Location: [facility] CATH LAB;  Service: Cardiovascular;  Laterality: N/A;   Lower Extremity Arterial Dopplers  08/2013   ABI: R 0.96, L 1.04   MULTIPLE TOOTH EXTRACTIONS  age 57   NM MYOVIEW LTD  03/2016   EF 62%. LOW RISK. C/W prior MI - no Ischemia. Apical hypokinesis.   PERCUTANEOUS CORONARY STENT INTERVENTION (PCI-S)  5/'01, 3/'02, 8/'03, 10/'04;   '01 - S660 BMS 2.5 x 9 - dSVG-D1 into D1; '02- post-stent stenosis - 2.5 x  8 Pixel BMS; '8\03: ISR/Thrombosis into native D1 - AngioJet, 2.5 x 13 Pixel; '04 - ISR 95% - covered stented area with Taxus DES 2.5 mm x 20 (2.88)   PERCUTANEOUS CORONARY STENT INTERVENTION (PCI-S)  08/23/2013   Procedure: PERCUTANEOUS CORONARY STENT INTERVENTION (PCI-S);  Surgeon: Iran Ouch, MD;  Location: [facility] CATH LAB;  Service: Cardiovascular;;mid SVG-D1 80%; distal stent ~50% ISR; Promus Prermier DES 2.75 mm xc 20 mm (2.8 mm)   PORT-A-CATH REMOVAL     kidney   TRANSTHORACIC ECHOCARDIOGRAM  07/2019   a) 07/2019: EF 55 to 60%.  No LVH.  Paradoxical septal WM-s/p CABG.  GRII DD.  Nl RV size and fxn.  Mild bilateral atrial dilation.  Mod MAC.  Trace MR.  Mild AS (gradients: Mean 14.3 mmHg -peak 24.9 mmHg).; B) 06/2020: EF 40 to 45%.  Moderate concentric LVH.  GRII DD.  Elevated LAP.  Mod HK mid Apical Ant-AntSept wall & mild Apical Dyskinesis.  Mod LA dilation.  Mild MR.  AoV sclerosis w/o AS.   TRANSTHORACIC ECHOCARDIOGRAM  01/30/2021   EF 55 to 60%.  Mild LVH.  GR 1 DD.  Elevated LAP.  Moderate LA dilation.  Mild MR with mild MS.  Mild aortic valve stenosis.:   TUBAL LIGATION     wrist fistula repair Left    dialysis for one year   Family History Family History  Problem Relation Age of Onset   Cancer Mother         liver   Heart disease Father    Cancer Father        colon   Arrhythmia Brother        Atrial Fibrillation   Arrhythmia Paternal Aunt        Atrial Fibrillation    Social History Social History   Tobacco Use   Smoking status: Former    Packs/day: 1.00    Years: 30.00    Additional pack years: 0.00    Total pack years: 30.00    Types: Cigarettes    Quit date: 08/17/2002    Years since quitting: 20.2   Smokeless tobacco: Never  Vaping Use   Vaping Use: Never used  Substance Use Topics   Alcohol use: No   Drug use: No   Allergies Tetracycline, Niacin, Niaspan [niacin er], Sulfa antibiotics, Sulfonamide derivatives, Codeine, Erythromycin, Hydromorphone hcl, Morphine and related, Nalbuphine, Sulfasalazine, and Tape  Review of Systems Review of Systems  All other systems reviewed and are negative.   Physical Exam Vital Signs  I have reviewed the triage vital signs BP (!) 107/51   Pulse 97   Temp 97.9 F (36.6 C) (Oral)   Resp (!) 22   Ht  (1.549 m)   Wt 78.5 kg   SpO2 100%   BMI 32.69 kg/m  Physical Exam Vitals and nursing note reviewed.  Constitutional:      General: She is not in acute distress.    Appearance: She is well-developed.     Comments: Chronically ill-appearing  HENT:     Head: Normocephalic and atraumatic.     Mouth/Throat:     Mouth: Mucous membranes are moist.  Eyes:     Pupils: Pupils are equal, round, and reactive to light.  Neck:     Vascular: No JVD.  Cardiovascular:     Rate and Rhythm: Normal rate and regular rhythm.     Heart sounds: No murmur heard. Pulmonary:     Effort: Pulmonary effort is normal. No  respiratory distress.     Breath sounds: Rales (trace throughout) present.  Abdominal:     General: Abdomen is flat.     Palpations: Abdomen is soft.     Tenderness: There is no abdominal tenderness.  Musculoskeletal:        General: No tenderness.     Comments: 1+ bilateral lower extremity edema  Skin:    General: Skin  is warm and dry.  Neurological:     General: No focal deficit present.     Mental Status: She is alert. Mental status is at baseline.  Psychiatric:        Mood and Affect: Mood normal.        Behavior: Behavior normal.     ED Results and Treatments Labs (all labs ordered are listed, but only abnormal results are displayed) Labs Reviewed  COMPREHENSIVE METABOLIC PANEL - Abnormal; Notable for the following components:      Result Value   Chloride 94 (*)    Glucose, Bld 226 (*)    Creatinine, Ser 1.19 (*)    Albumin 2.9 (*)    GFR, Estimated 51 (*)    Anion gap 16 (*)    All other components within normal limits  CBC WITH DIFFERENTIAL/PLATELET - Abnormal; Notable for the following components:   RBC 3.43 (*)    Hemoglobin 9.9 (*)    HCT 32.5 (*)    RDW 18.7 (*)    All other components within normal limits  BRAIN NATRIURETIC PEPTIDE - Abnormal; Notable for the following components:   B Natriuretic Peptide 724.7 (*)    All other components within normal limits  I-STAT VENOUS BLOOD GAS, ED - Abnormal; Notable for the following components:   pH, Ven 7.557 (*)    pCO2, Ven 37.6 (*)    Bicarbonate 33.4 (*)    TCO2 35 (*)    Acid-Base Excess 10.0 (*)    Calcium, Ion 1.10 (*)    HCT 31.0 (*)    Hemoglobin 10.5 (*)    All other components within normal limits  TROPONIN I (HIGH SENSITIVITY) - Abnormal; Notable for the following components:   Troponin I (High Sensitivity) 28 (*)    All other components within normal limits  TROPONIN I (HIGH SENSITIVITY) - Abnormal; Notable for the following components:   Troponin I (High Sensitivity) 27 (*)    All other components within normal limits                                                                                                                          Radiology DG Chest Port 1 View  Result Date: 11/28/2022 CLINICAL DATA:  Cough. EXAM: PORTABLE CHEST 1 VIEW COMPARISON:  11/26/2022 FINDINGS: Status post median sternotomy and  CABG procedure. Stable mild cardiac enlargement. Diffuse reticular interstitial opacities are identified throughout both lungs compatible with chronic interstitial lung disease. No sign of pleural effusion. No superimposed airspace consolidation. Visualized osseous structures are unremarkable. IMPRESSION: 1. Chronic  interstitial lung disease. 2. Cardiac enlargement.  Status post CABG procedure. Electronically Signed   By: Signa Kell M.D.   On: 11/28/2022 09:08    Pertinent labs & imaging results that were available during my care of the patient were reviewed by me and considered in my medical decision making (see MDM for details).  Medications Ordered in ED Medications  oxyCODONE (Oxy IR/ROXICODONE) immediate release tablet 10 mg (has no administration in time range)  LORazepam (ATIVAN) tablet 1 mg (1 mg Oral Given 11/28/22 0920)  oxyCODONE-acetaminophen (PERCOCET/ROXICET) 5-325 MG per tablet 2 tablet (2 tablets Oral Given 11/28/22 0920)                                                                                                                                     Procedures Procedures  (including critical care time)  Medical Decision Making / ED Course   MDM:  67 year old female presenting to the emergency department with chest pain.  Patient well-appearing.  EKG shows stable left bundle branch block.  Examination without signs of gross fluid overload, did have some crackles in her lungs but on review of chart shows have a history of interstitial lung disease, and is not hypoxic on her home oxygen.  Unclear cause of chest pain, review of the hospital records from recent hospitalizations patient has had previous episodes of chest pain when she wakes up which improves with nitroglycerin.  She was seen by cardiology and there was not plans to obtain a catheterization.  Her troponin is stable at 27 here.  This seems to be around her baseline.  She has no signs of ACS on her EKG.  She is no  longer having chest pain.  Doubt PE as she is on her chronic home Eliquis, not hypoxic and not tachycardic.  Low concern for CHF, her BNP is improved from recent hospitalization and her weight is actually down from previous hospitalization and does not appear grossly volume overloaded.  Chest x-ray with no pneumonia no focal findings, no pneumothorax.  No wheezing to suggest COPD exacerbation.  VBG notable for respiratory alkalosis, likely due to initially to shortness of breath which is improved, no longer tachypneic.  She denies any chronic salicylate use and does not take aspirin, doubt salicylate toxicity or other acute abnormality.  Previous hospitalizations patient was recommended to be discharged to a SNF or rehab facility as they felt she had poor ability to care for self at home.  Patient is adamant that she continues to not want to go to a SNF or rehab facility.  Given that her testing is overall reassuring and seems at baseline, she is no longer having chest pain, she request discharge.  She has hospital follow-up appointment with her primary doctor on Monday.  Will place referral for cardiology for expedited cardiology referral as well. Will discharge patient to home. All questions answered. Patient comfortable with plan of discharge. Return  precautions discussed with patient and specified on the after visit summary.       Additional history obtained: -Additional history obtained from ems -External records from outside source obtained and reviewed including: Chart review including previous notes, labs, imaging, consultation notes including 2 recent inpatient hospitalization discharge summaries   Lab Tests: -I ordered, reviewed, and interpreted labs.   The pertinent results include:   Labs Reviewed  COMPREHENSIVE METABOLIC PANEL - Abnormal; Notable for the following components:      Result Value   Chloride 94 (*)    Glucose, Bld 226 (*)    Creatinine, Ser 1.19 (*)    Albumin 2.9 (*)     GFR, Estimated 51 (*)    Anion gap 16 (*)    All other components within normal limits  CBC WITH DIFFERENTIAL/PLATELET - Abnormal; Notable for the following components:   RBC 3.43 (*)    Hemoglobin 9.9 (*)    HCT 32.5 (*)    RDW 18.7 (*)    All other components within normal limits  BRAIN NATRIURETIC PEPTIDE - Abnormal; Notable for the following components:   B Natriuretic Peptide 724.7 (*)    All other components within normal limits  I-STAT VENOUS BLOOD GAS, ED - Abnormal; Notable for the following components:   pH, Ven 7.557 (*)    pCO2, Ven 37.6 (*)    Bicarbonate 33.4 (*)    TCO2 35 (*)    Acid-Base Excess 10.0 (*)    Calcium, Ion 1.10 (*)    HCT 31.0 (*)    Hemoglobin 10.5 (*)    All other components within normal limits  TROPONIN I (HIGH SENSITIVITY) - Abnormal; Notable for the following components:   Troponin I (High Sensitivity) 28 (*)    All other components within normal limits  TROPONIN I (HIGH SENSITIVITY) - Abnormal; Notable for the following components:   Troponin I (High Sensitivity) 27 (*)    All other components within normal limits    Notable for elevated BNP, improved from previous, stable troponin elevation at baseline  EKG   EKG Interpretation  Date/Time:  Saturday November 28 2022 08:02:08 EDT Ventricular Rate:  95 PR Interval:  51 QRS Duration: 162 QT Interval:  415 QTC Calculation: 522 R Axis:   -41 Text Interpretation: Sinus rhythm Short PR interval Left bundle branch block Confirmed by Alvino Blood ([id number]) on 11/28/2022 8:03:51 AM         Imaging Studies ordered: I ordered imaging studies including CXR On my interpretation imaging demonstrates chronic interstitial changes I independently visualized and interpreted imaging. I agree with the radiologist interpretation   Medicines ordered and prescription drug management: Meds ordered this encounter  Medications   LORazepam (ATIVAN) tablet 1 mg   oxyCODONE-acetaminophen  (PERCOCET/ROXICET) 5-325 MG per tablet 2 tablet   DISCONTD: furosemide (LASIX) injection 40 mg   oxyCODONE (Oxy IR/ROXICODONE) immediate release tablet 10 mg    -I have reviewed the patients home medicines and have made adjustments as needed   Cardiac Monitoring: The patient was maintained on a cardiac monitor.  I personally viewed and interpreted the cardiac monitored which showed an underlying rhythm of: sinus rhythm   Social Determinants of Health:  Diagnosis or treatment significantly limited by social determinants of health: obesity and lives alone   Reevaluation: After the interventions noted above, I reevaluated the patient and found that their symptoms have improved  Co morbidities that complicate the patient evaluation  Past Medical History:  Diagnosis Date  Anemia    Anxiety    Bilateral carotid artery stenosis    Carotid duplex 08/6107: 1-39% LICA, 60-79% RICA, >50% RECA, f/u 1 yr suggested   CAD (coronary artery disease) of bypass graft 5/01; 3/'02, 8/'03, 10/'04; 1/15   PCI x 5 to SVG-D1    CAD in native artery 07/1993   3 Vessel Disease (LAD-D1 & RCA) -- CABG (Dx in setting of inferior STEMI-PTCA of RCA)   COPD mixed type    Followed by Dr. Delton Coombes "pulmonologist said no COPD"   Depression with anxiety    Diabetes mellitus type 2 in obese    Diarrhea    started after cholecystectomy and mass removed from intestine   Dyslipidemia, goal LDL below 70    08/2012: TC 137, TG 200, HDL 32!, LDL 45; on statin (followed by Dr.Deterding)   ESRD (end stage renal disease) 1991   s/p Cadaveric Renal Transplant [facility] - Dr. 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  Had having having lots of nausea and vomiting with poor p.o. intake.  Thought to have QT prolongation with multiple medications involved and hypomagnesemia, hypokalemia.  Tikosyn discontinued along with Zoloft and Phenergan.   Hypertension associated with diabetes    Mild aortic stenosis by prior echocardiogram 07/2019   Echo:  Mild aortic stenosis (gradients: Mean 14.3 mmHg -peak 24.9 mmHg).   Morbid obesity    MRSA (methicillin resistant staph aureus) culture positive    OSA (obstructive sleep apnea)    no longer on CPAP or home O2, states she doesn't need now after lap band   PAD (peripheral artery disease) 08/2013   LEA Dopplers to be read by Dr. Kirke Corin   PAF (paroxysmal atrial fibrillation) 06/2014   Noted on CardioNet Monitor  - --> rhythm control with Tikosyn (Dr. Johney Frame); converted from warfarin to apixaban for anticoagulation.   Pneumonia    Recurrent boils    Bilateral Groin   Rheumatoid arthritis    Per Patient Report; associated with OA   S/p cadaver renal transplant 1991   [facility]      Dispostion: Disposition decision including need for hospitalization was considered, and patient discharged from emergency department.    Final Clinical Impression(s) / ED Diagnoses Final diagnoses:  Atypical chest pain     This chart was dictated using voice recognition software.  Despite best efforts to proofread,  errors can occur which can change the documentation meaning.    Lonell Grandchild, MD 11/28/22 1323

## 2022-11-28 NOTE — ED Notes (Signed)
Patient is alert and oriented, able to verbalize her needs, currently eating dinner and watching TV, no s/s of distress, will continue to monitor.

## 2022-11-30 ENCOUNTER — Telehealth: Payer: Self-pay

## 2022-11-30 DIAGNOSIS — Z5982 Transportation insecurity: Secondary | ICD-10-CM

## 2022-11-30 NOTE — Telephone Encounter (Signed)
She is not [facility] ACO patient, but needs transportation assistance. D/c hospital 4/11 - has f/u appt 4/22 and has no ride. Any assistance or information you can share would be appreciated

## 2022-11-30 NOTE — Transitions of Care (Post Inpatient/ED Visit) (Signed)
   11/30/2022  Name: Katherine [name] MRN: [id number] DOB: 11/05/1955  Today's TOC FU Call Status: Today's TOC FU Call Status:: Successful TOC FU Call Competed TOC FU Call Complete Date: 11/30/22  Transition Care Management Follow-up Telephone Call Date of Discharge: 11/28/22 Discharge Facility: [facility]) Type of Discharge: Emergency Department Reason for ED Visit: Other: (other chest pain) How have you been since you were released from the hospital?: Same Any questions or concerns?: No  Items Reviewed: Did you receive and understand the discharge instructions provided?: Yes Medications obtained and verified?: Yes (Medications Reviewed) Any new allergies since your discharge?: No Dietary orders reviewed?: Yes Do you have support at home?: No  Home Care and Equipment/Supplies: Were Home Health Services Ordered?: NA Has Agency set up a time to come to your home?: No Any new equipment or medical supplies ordered?: NA Were you able to get the equipment/medical supplies?: No Do you have any questions related to the use of the equipment/supplies?: No  Functional Questionnaire: Do you need assistance with bathing/showering or dressing?: No Do you need assistance with meal preparation?: No Do you need assistance with eating?: No Do you have difficulty maintaining continence: No Do you need assistance with getting out of bed/getting out of a chair/moving?: No Do you have difficulty managing or taking your medications?: No  Follow up appointments reviewed: PCP Follow-up appointment confirmed?: Yes Date of PCP follow-up appointment?: 12/07/22 Follow-up Provider: Dr Yetta Barre [facility] Follow-up appointment confirmed?: NA Do you need transportation to your follow-up appointment?: No Do you understand care options if your condition(s) worsen?: Yes-patient verbalized understanding    SIGNATURE Karena Addison, LPN [facility] Nurse Health Advisor Direct Dial [phone]

## 2022-11-30 NOTE — Transitions of Care (Post Inpatient/ED Visit) (Signed)
   11/30/2022  Name: Katherine [name] MRN: [id number] DOB: 1955/09/02  Today's TOC FU Call Status: Today's TOC FU Call Status:: Successful TOC FU Call Competed TOC FU Call Complete Date: 11/30/22  Transition Care Management Follow-up Telephone Call Date of Discharge: 11/26/22 Discharge Facility: [facility]) Type of Discharge: Inpatient Admission Primary Inpatient Discharge Diagnosis:: SOB How have you been since you were released from the hospital?: Same Any questions or concerns?: No  Items Reviewed: Did you receive and understand the discharge instructions provided?: Yes Medications obtained and verified?: Yes (Medications Reviewed) Any new allergies since your discharge?: No Dietary orders reviewed?: Yes Type of Diet Ordered:: heart healthy Do you have support at home?: No  Home Care and Equipment/Supplies: Were Home Health Services Ordered?: Yes Name of Home Health Agency:: [facility] Has Agency set up a time to come to your home?: Yes First Home Health Visit Date: 11/27/22 Any new equipment or medical supplies ordered?: Yes Name of Medical supply agency?: [facility] Were you able to get the equipment/medical supplies?: Yes Do you have any questions related to the use of the equipment/supplies?: No  Functional Questionnaire: Do you need assistance with bathing/showering or dressing?: Yes (usually just does sponge baths) Do you need assistance with meal preparation?: No Do you need assistance with eating?: No Do you have difficulty maintaining continence: No Do you need assistance with getting out of bed/getting out of a chair/moving?: No Do you have difficulty managing or taking your medications?: No  Follow up appointments reviewed: PCP Follow-up appointment confirmed?: Yes Date of PCP follow-up appointment?: 12/07/22 Follow-up Provider: Sanda Linger, MD Specialist Hospital Follow-up appointment confirmed?: Yes Date of Specialist follow-up appointment?:  12/08/22 Follow-Up Specialty Provider:: Lisabeth Devoid, Cardio Do you need transportation to your follow-up appointment?: Yes Transportation Need Intervention Addressed By:: AMB Referral For SDOH Needs Do you understand care options if your condition(s) worsen?: Yes-patient verbalized understanding    SIGNATURE Khyrin Trevathan, LPN [facility] AWV Team Direct Dial: [phone]

## 2022-12-01 ENCOUNTER — Telehealth: Payer: Self-pay | Admitting: Internal Medicine

## 2022-12-01 NOTE — Telephone Encounter (Signed)
Cara from [facility] verbals  1 week 1 2 week 1 1 week 4 Best call back # [phone]

## 2022-12-01 NOTE — Telephone Encounter (Signed)
Prescription Request  12/01/2022  LOV: 06/16/2022  What is the name of the medication or equipment? oxycodone  Have you contacted your pharmacy to request a refill? No   Which pharmacy would you like this sent to?  [id number] [place], [place] - [address] [place] [address] Phone: [phone] Fax: [phone]     Patient notified that their request is being sent to the clinical staff for review and that they should receive a response within 2 business days.   Please advise at Mobile [phone] (mobile)

## 2022-12-02 ENCOUNTER — Telehealth: Payer: Self-pay | Admitting: Internal Medicine

## 2022-12-02 ENCOUNTER — Other Ambulatory Visit: Payer: Self-pay | Admitting: Internal Medicine

## 2022-12-02 DIAGNOSIS — M797 Fibromyalgia: Secondary | ICD-10-CM

## 2022-12-02 DIAGNOSIS — M1711 Unilateral primary osteoarthritis, right knee: Secondary | ICD-10-CM

## 2022-12-02 DIAGNOSIS — M4726 Other spondylosis with radiculopathy, lumbar region: Secondary | ICD-10-CM

## 2022-12-02 DIAGNOSIS — Z515 Encounter for palliative care: Secondary | ICD-10-CM

## 2022-12-02 DIAGNOSIS — M47816 Spondylosis without myelopathy or radiculopathy, lumbar region: Secondary | ICD-10-CM

## 2022-12-02 MED ORDER — OXYCODONE HCL 5 MG PO TABS
5.0000 mg | ORAL_TABLET | Freq: Four times a day (QID) | ORAL | 0 refills | Status: DC | PRN
Start: 2022-12-02 — End: 2022-12-13

## 2022-12-02 NOTE — Telephone Encounter (Signed)
Patient would like call back at [phone] when this has been called in to pharmacy. States she is in a lot of pain.

## 2022-12-02 NOTE — Telephone Encounter (Signed)
Verbal orders given to [facility] for OT as requested.

## 2022-12-02 NOTE — Telephone Encounter (Signed)
Yes and pain med ordered

## 2022-12-02 NOTE — Telephone Encounter (Signed)
Called Cara no answer LMOM w/MD response../lmb 

## 2022-12-02 NOTE — Telephone Encounter (Signed)
Caller & What [employer]:Jim with [facility]  [phone] Phone Number:   Needs Verbal orders for what service & frequency:OT for 1 time a week for 5 weeks.

## 2022-12-02 NOTE — Telephone Encounter (Signed)
Notified pt pain med sent to pharmacy.[name] KitchenRaechel [name]

## 2022-12-03 ENCOUNTER — Other Ambulatory Visit: Payer: Self-pay | Admitting: Internal Medicine

## 2022-12-03 DIAGNOSIS — M47816 Spondylosis without myelopathy or radiculopathy, lumbar region: Secondary | ICD-10-CM

## 2022-12-03 DIAGNOSIS — M4726 Other spondylosis with radiculopathy, lumbar region: Secondary | ICD-10-CM

## 2022-12-03 DIAGNOSIS — Z515 Encounter for palliative care: Secondary | ICD-10-CM

## 2022-12-03 DIAGNOSIS — M797 Fibromyalgia: Secondary | ICD-10-CM

## 2022-12-04 ENCOUNTER — Other Ambulatory Visit: Payer: Self-pay | Admitting: Internal Medicine

## 2022-12-04 IMAGING — DX DG CHEST 1V
1 series · 1 of 1 positions shown · non-contrast
Comparison: 10/18/2021

CLINICAL DATA: Fall yesterday

EXAM:
CHEST  1 VIEW

[chest ap]
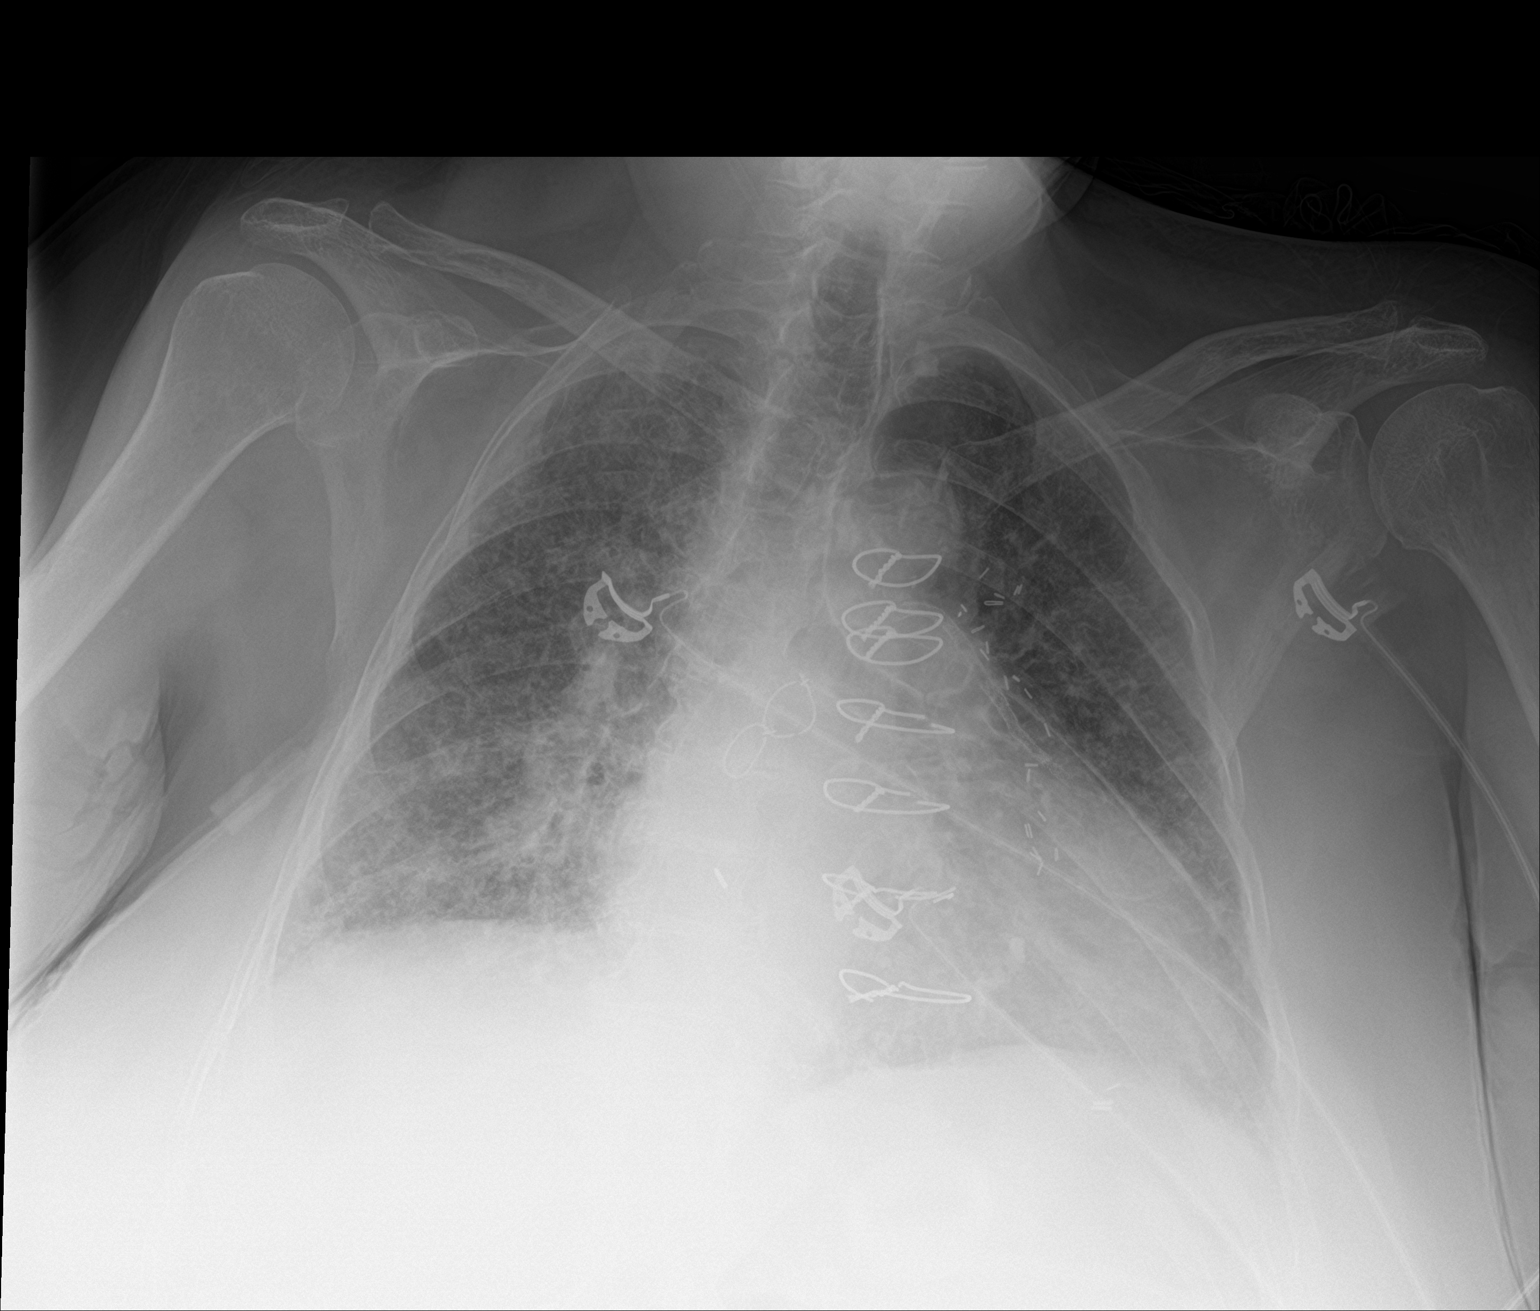

[1 of 1 positions shown; findings below may reference images not displayed]

FINDINGS: Cardiac enlargement. Prior CABG. Broken sternal wires are chronic
and unchanged

Hazy diffuse bilateral airspace disease right greater than left has
progressed in the interval. Probable pulmonary edema. No significant
pleural effusion.
IMPRESSION: Progression of bilateral airspace disease right greater than left.
Probable edema.

## 2022-12-04 IMAGING — DX DG SHOULDER 2+V*R*
3 series · 3 of 3 positions shown · non-contrast
Comparison: 03/30/2021

CLINICAL DATA: Fall.  Right shoulder pain

EXAM:
RIGHT SHOULDER - 2+ VIEW

[shoulder grashey]
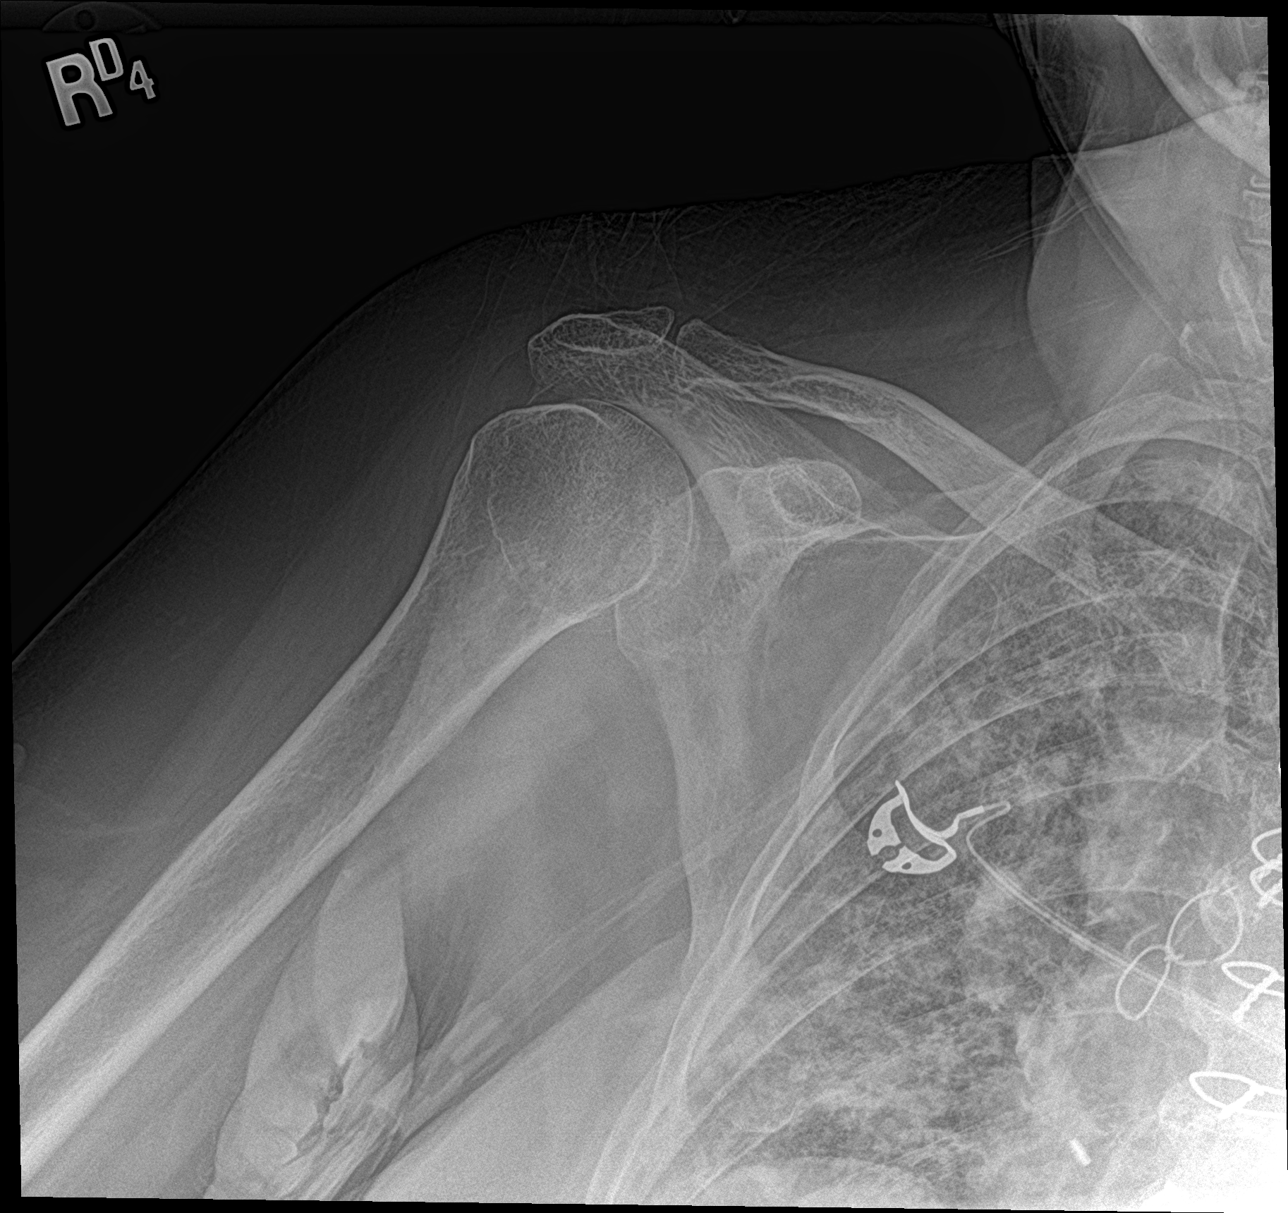

[shoulder y view]
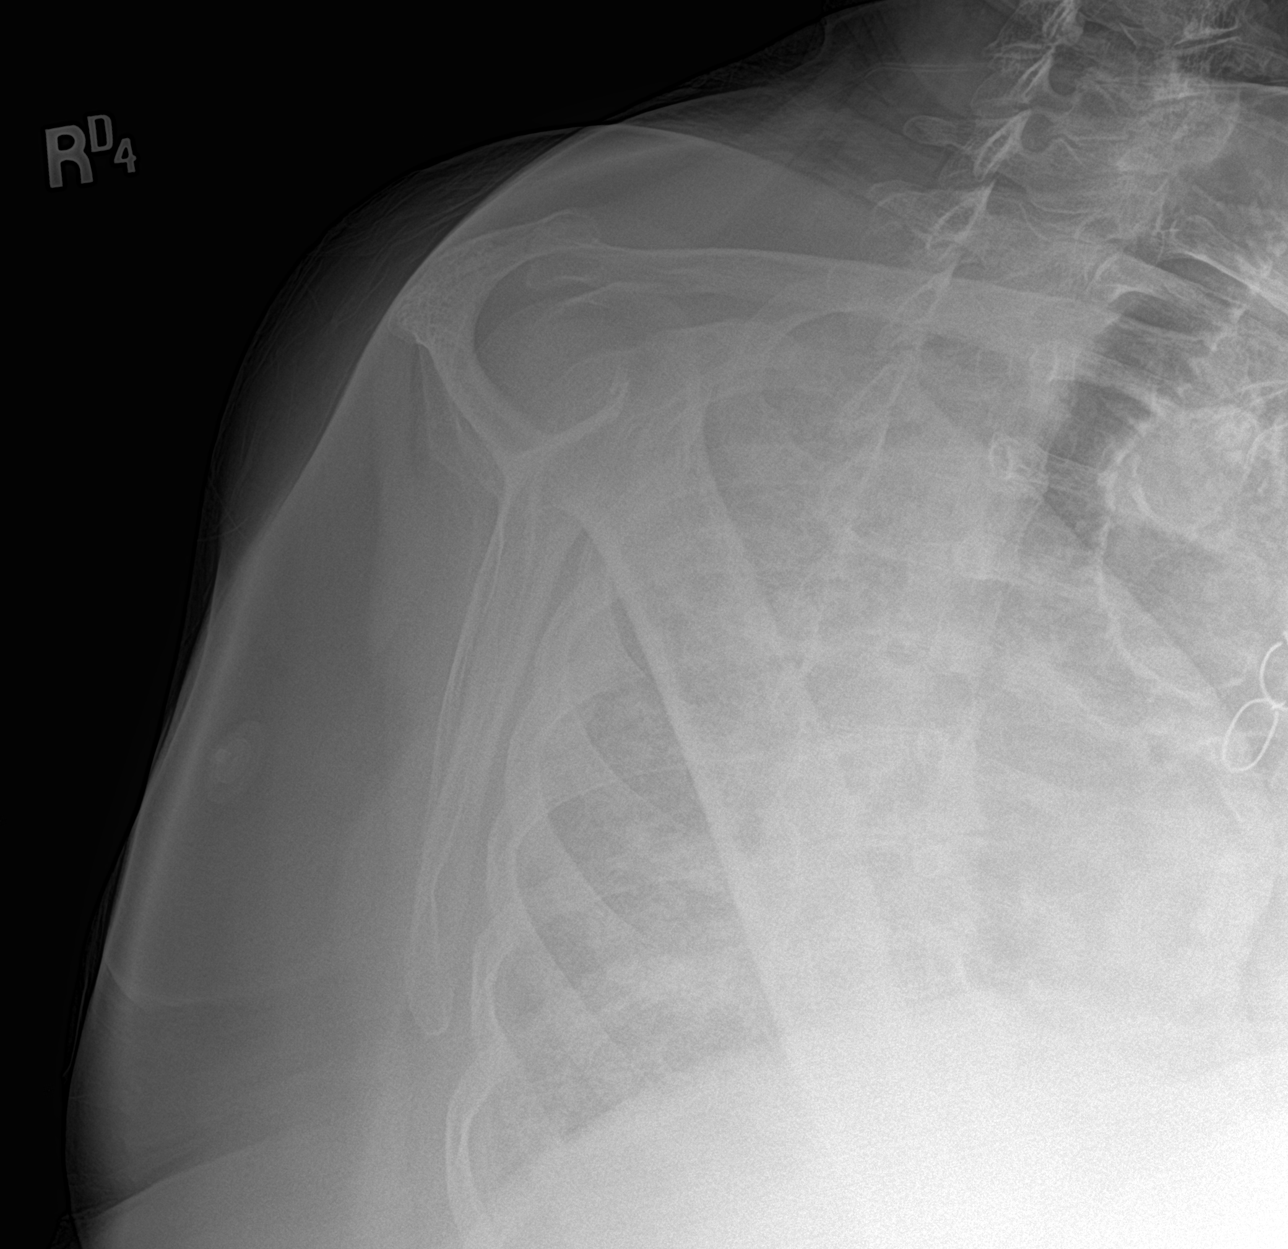

[shoulder ap neutral]
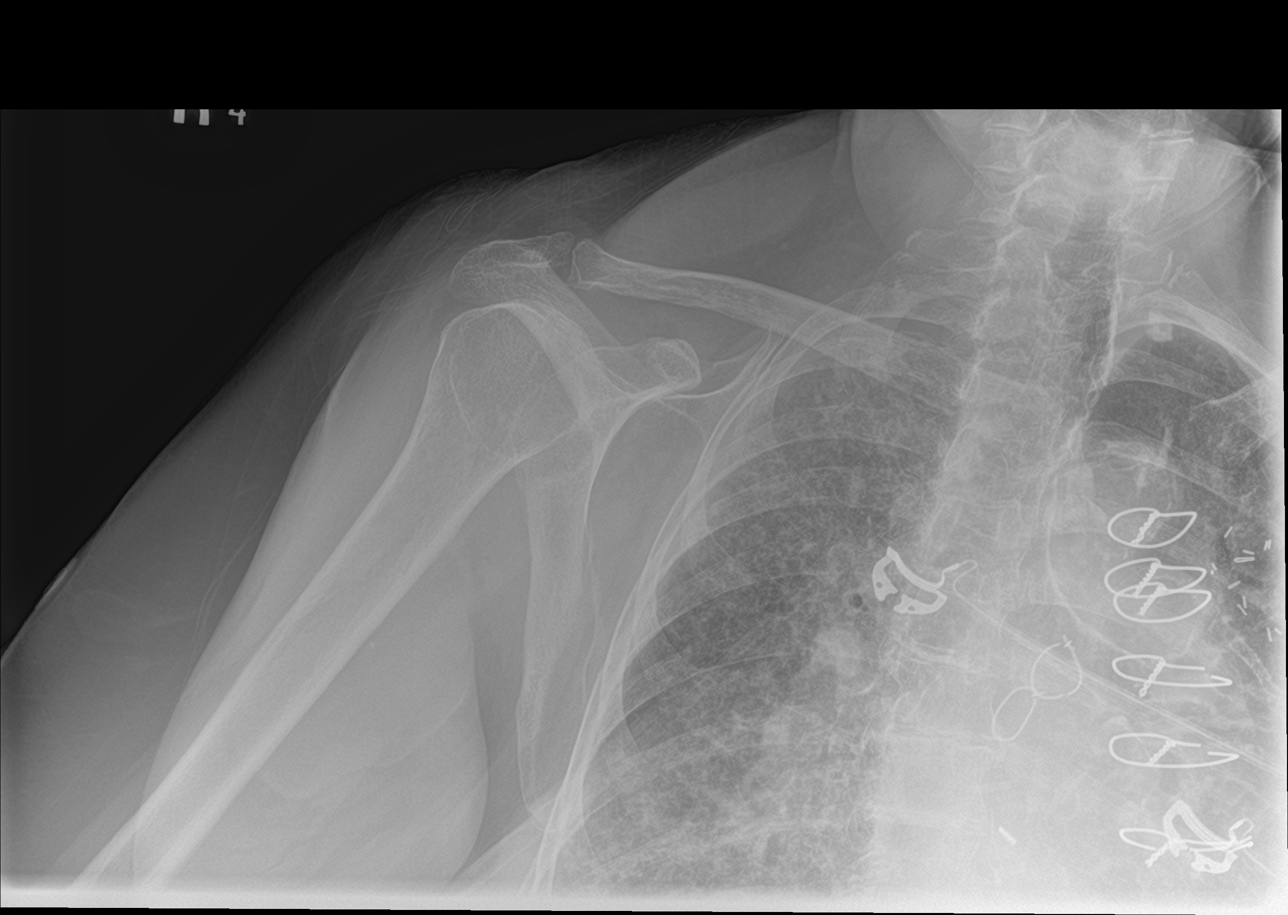

[3 of 3 positions shown; findings below may reference images not displayed]

FINDINGS: Normal alignment.  No fracture or arthropathy

Postop CABG. Diffuse bilateral airspace disease. Correlate with
chest x-ray
IMPRESSION: Negative for fracture or dislocation.

## 2022-12-04 NOTE — Telephone Encounter (Signed)
Pt has stated the pain meds that was sent in is the wrong dosage. She states it is suppose to be  and not the   oxyCODONE (OXY IR/ROXICODONE) 5 MG immediate release tablet

## 2022-12-05 IMAGING — DX DG CHEST 1V
1 series · 1 of 1 positions shown · non-contrast
Comparison: 11/20/2021

CLINICAL DATA: Right shoulder injury last night status post trip
and fall. Increased shortness of breath. CHF.

EXAM:
CHEST  1 VIEW

[chest ap]
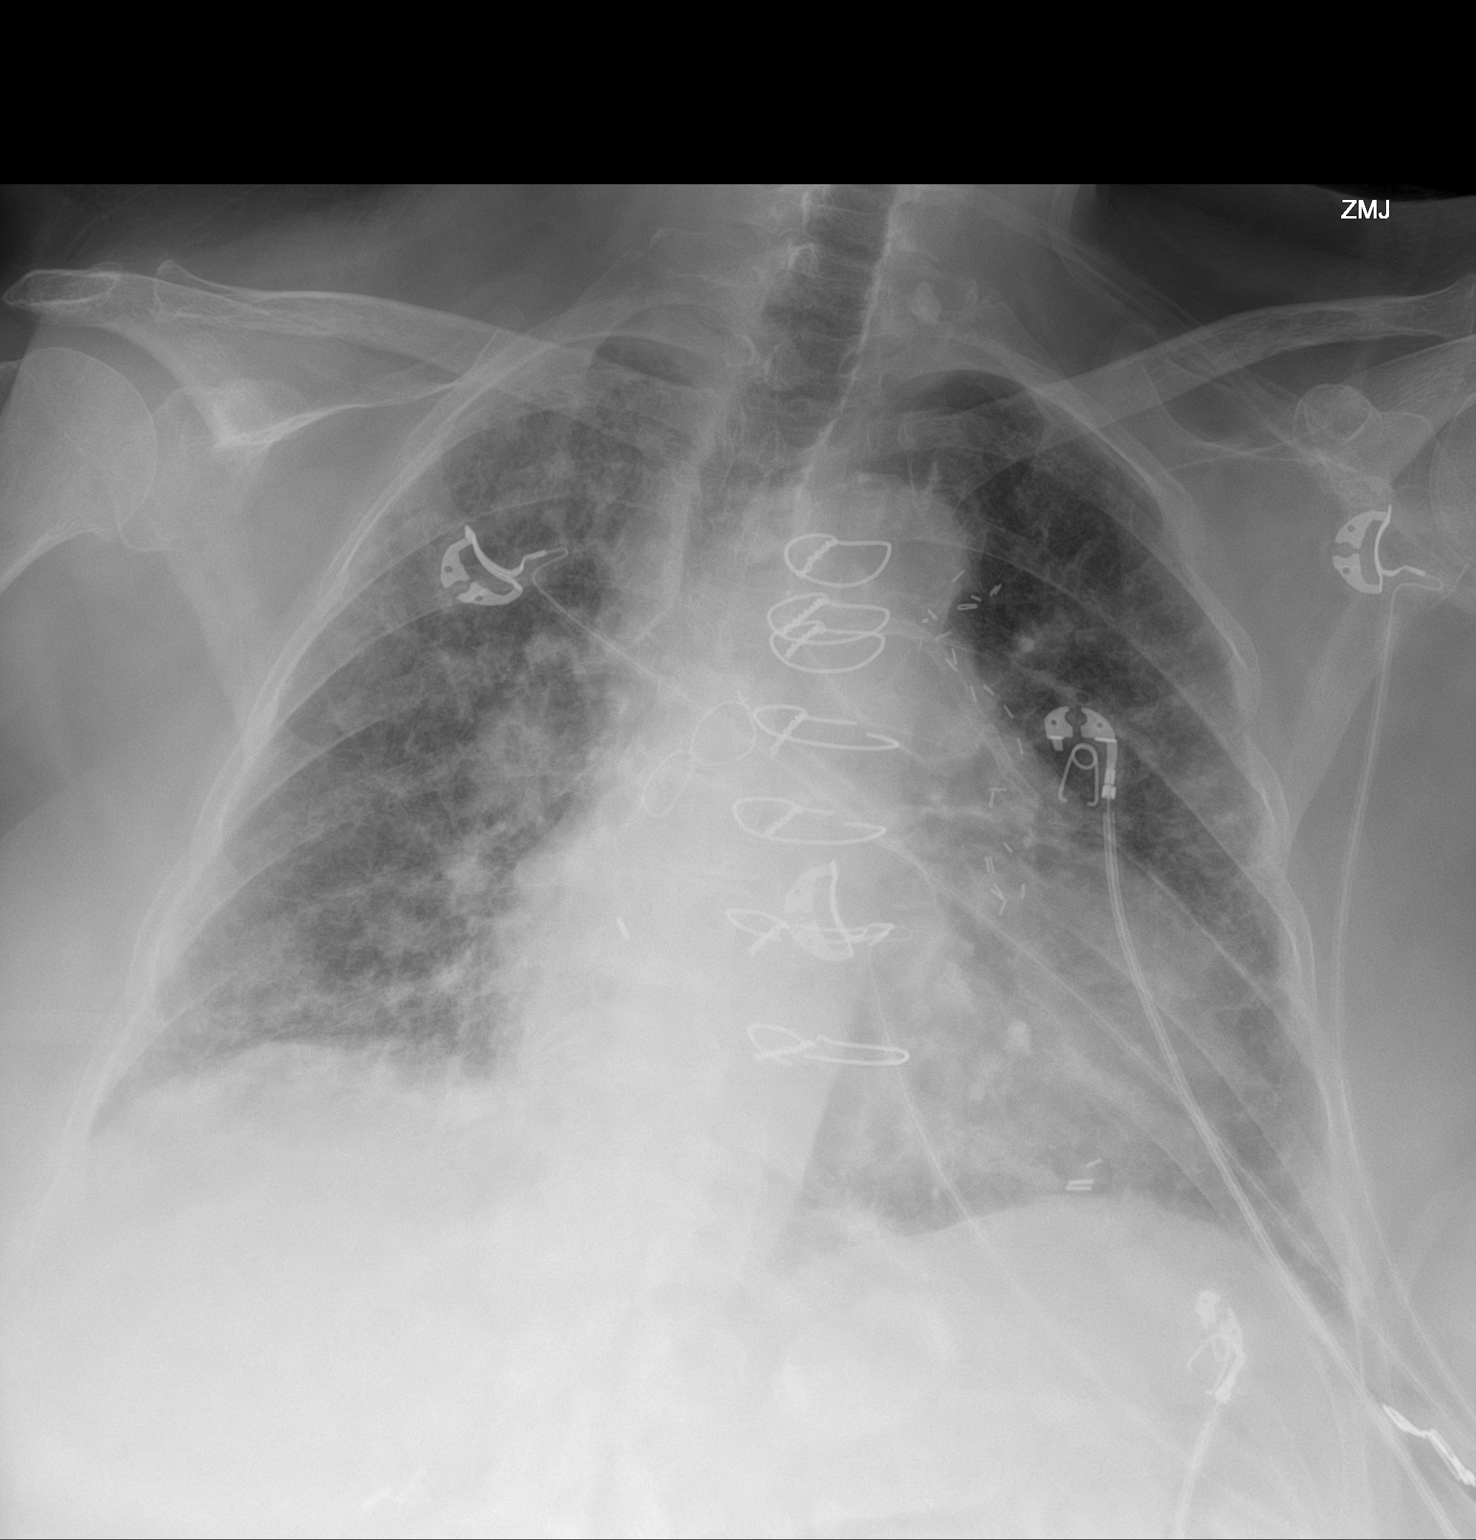

[1 of 1 positions shown; findings below may reference images not displayed]

FINDINGS: Postsurgical changes of CABG again seen. Unchanged cardiomegaly.
Diffuse bilateral airspace opacities again seen.
IMPRESSION: 1. Unchanged diffuse airspace opacities may be due to pulmonary
edema or pneumonia.
2. Unchanged cardiomegaly

## 2022-12-06 ENCOUNTER — Inpatient Hospital Stay (HOSPITAL_COMMUNITY)
Admission: EM | Admit: 2022-12-06 | Discharge: 2022-12-16 | DRG: 871 | Disposition: E | Payer: Medicare Other | Attending: Internal Medicine | Admitting: Internal Medicine

## 2022-12-06 ENCOUNTER — Encounter (HOSPITAL_COMMUNITY): Admission: EM | Disposition: E | Payer: Self-pay | Source: Home / Self Care | Attending: Internal Medicine

## 2022-12-06 ENCOUNTER — Emergency Department (HOSPITAL_COMMUNITY): Payer: Medicare Other

## 2022-12-06 DIAGNOSIS — I5023 Acute on chronic systolic (congestive) heart failure: Secondary | ICD-10-CM | POA: Diagnosis present

## 2022-12-06 DIAGNOSIS — G4733 Obstructive sleep apnea (adult) (pediatric): Secondary | ICD-10-CM | POA: Diagnosis present

## 2022-12-06 DIAGNOSIS — R0603 Acute respiratory distress: Secondary | ICD-10-CM

## 2022-12-06 DIAGNOSIS — I4819 Other persistent atrial fibrillation: Secondary | ICD-10-CM | POA: Diagnosis not present

## 2022-12-06 DIAGNOSIS — Z79899 Other long term (current) drug therapy: Secondary | ICD-10-CM

## 2022-12-06 DIAGNOSIS — Z7984 Long term (current) use of oral hypoglycemic drugs: Secondary | ICD-10-CM

## 2022-12-06 DIAGNOSIS — Z66 Do not resuscitate: Secondary | ICD-10-CM | POA: Diagnosis present

## 2022-12-06 DIAGNOSIS — I4891 Unspecified atrial fibrillation: Secondary | ICD-10-CM | POA: Diagnosis not present

## 2022-12-06 DIAGNOSIS — Z91048 Other nonmedicinal substance allergy status: Secondary | ICD-10-CM

## 2022-12-06 DIAGNOSIS — Z7902 Long term (current) use of antithrombotics/antiplatelets: Secondary | ICD-10-CM

## 2022-12-06 DIAGNOSIS — I152 Hypertension secondary to endocrine disorders: Secondary | ICD-10-CM | POA: Diagnosis present

## 2022-12-06 DIAGNOSIS — I25708 Atherosclerosis of coronary artery bypass graft(s), unspecified, with other forms of angina pectoris: Secondary | ICD-10-CM | POA: Diagnosis not present

## 2022-12-06 DIAGNOSIS — Z8249 Family history of ischemic heart disease and other diseases of the circulatory system: Secondary | ICD-10-CM

## 2022-12-06 DIAGNOSIS — Z5982 Transportation insecurity: Secondary | ICD-10-CM

## 2022-12-06 DIAGNOSIS — E1151 Type 2 diabetes mellitus with diabetic peripheral angiopathy without gangrene: Secondary | ICD-10-CM | POA: Diagnosis present

## 2022-12-06 DIAGNOSIS — I25810 Atherosclerosis of coronary artery bypass graft(s) without angina pectoris: Secondary | ICD-10-CM | POA: Diagnosis present

## 2022-12-06 DIAGNOSIS — I2510 Atherosclerotic heart disease of native coronary artery without angina pectoris: Secondary | ICD-10-CM | POA: Diagnosis present

## 2022-12-06 DIAGNOSIS — Z7901 Long term (current) use of anticoagulants: Secondary | ICD-10-CM

## 2022-12-06 DIAGNOSIS — R079 Chest pain, unspecified: Secondary | ICD-10-CM | POA: Diagnosis not present

## 2022-12-06 DIAGNOSIS — J189 Pneumonia, unspecified organism: Secondary | ICD-10-CM | POA: Diagnosis present

## 2022-12-06 DIAGNOSIS — R609 Edema, unspecified: Secondary | ICD-10-CM | POA: Diagnosis not present

## 2022-12-06 DIAGNOSIS — Z809 Family history of malignant neoplasm, unspecified: Secondary | ICD-10-CM

## 2022-12-06 DIAGNOSIS — J440 Chronic obstructive pulmonary disease with acute lower respiratory infection: Secondary | ICD-10-CM | POA: Diagnosis present

## 2022-12-06 DIAGNOSIS — M797 Fibromyalgia: Secondary | ICD-10-CM | POA: Diagnosis present

## 2022-12-06 DIAGNOSIS — Z882 Allergy status to sulfonamides status: Secondary | ICD-10-CM

## 2022-12-06 DIAGNOSIS — Z951 Presence of aortocoronary bypass graft: Secondary | ICD-10-CM

## 2022-12-06 DIAGNOSIS — I252 Old myocardial infarction: Secondary | ICD-10-CM

## 2022-12-06 DIAGNOSIS — Z87440 Personal history of urinary (tract) infections: Secondary | ICD-10-CM

## 2022-12-06 DIAGNOSIS — F32A Depression, unspecified: Secondary | ICD-10-CM | POA: Diagnosis present

## 2022-12-06 DIAGNOSIS — I509 Heart failure, unspecified: Secondary | ICD-10-CM | POA: Diagnosis not present

## 2022-12-06 DIAGNOSIS — Z955 Presence of coronary angioplasty implant and graft: Secondary | ICD-10-CM

## 2022-12-06 DIAGNOSIS — R570 Cardiogenic shock: Secondary | ICD-10-CM | POA: Diagnosis present

## 2022-12-06 DIAGNOSIS — Z515 Encounter for palliative care: Secondary | ICD-10-CM | POA: Diagnosis not present

## 2022-12-06 DIAGNOSIS — E1165 Type 2 diabetes mellitus with hyperglycemia: Secondary | ICD-10-CM | POA: Diagnosis present

## 2022-12-06 DIAGNOSIS — R54 Age-related physical debility: Secondary | ICD-10-CM | POA: Diagnosis present

## 2022-12-06 DIAGNOSIS — Z9884 Bariatric surgery status: Secondary | ICD-10-CM

## 2022-12-06 DIAGNOSIS — G8929 Other chronic pain: Secondary | ICD-10-CM | POA: Diagnosis present

## 2022-12-06 DIAGNOSIS — Z8673 Personal history of transient ischemic attack (TIA), and cerebral infarction without residual deficits: Secondary | ICD-10-CM

## 2022-12-06 DIAGNOSIS — J849 Interstitial pulmonary disease, unspecified: Secondary | ICD-10-CM | POA: Diagnosis present

## 2022-12-06 DIAGNOSIS — E1122 Type 2 diabetes mellitus with diabetic chronic kidney disease: Secondary | ICD-10-CM | POA: Diagnosis present

## 2022-12-06 DIAGNOSIS — R627 Adult failure to thrive: Secondary | ICD-10-CM | POA: Diagnosis present

## 2022-12-06 DIAGNOSIS — N390 Urinary tract infection, site not specified: Secondary | ICD-10-CM | POA: Diagnosis present

## 2022-12-06 DIAGNOSIS — M069 Rheumatoid arthritis, unspecified: Secondary | ICD-10-CM | POA: Diagnosis present

## 2022-12-06 DIAGNOSIS — J9621 Acute and chronic respiratory failure with hypoxia: Secondary | ICD-10-CM | POA: Diagnosis present

## 2022-12-06 DIAGNOSIS — Z7189 Other specified counseling: Secondary | ICD-10-CM | POA: Diagnosis not present

## 2022-12-06 DIAGNOSIS — Z6836 Body mass index (BMI) 36.0-36.9, adult: Secondary | ICD-10-CM | POA: Diagnosis not present

## 2022-12-06 DIAGNOSIS — Z940 Kidney transplant status: Secondary | ICD-10-CM

## 2022-12-06 DIAGNOSIS — Z1152 Encounter for screening for COVID-19: Secondary | ICD-10-CM | POA: Diagnosis not present

## 2022-12-06 DIAGNOSIS — Y830 Surgical operation with transplant of whole organ as the cause of abnormal reaction of the patient, or of later complication, without mention of misadventure at the time of the procedure: Secondary | ICD-10-CM | POA: Diagnosis present

## 2022-12-06 DIAGNOSIS — Z7951 Long term (current) use of inhaled steroids: Secondary | ICD-10-CM

## 2022-12-06 DIAGNOSIS — I213 ST elevation (STEMI) myocardial infarction of unspecified site: Secondary | ICD-10-CM

## 2022-12-06 DIAGNOSIS — D631 Anemia in chronic kidney disease: Secondary | ICD-10-CM | POA: Diagnosis present

## 2022-12-06 DIAGNOSIS — Z885 Allergy status to narcotic agent status: Secondary | ICD-10-CM

## 2022-12-06 DIAGNOSIS — Z823 Family history of stroke: Secondary | ICD-10-CM

## 2022-12-06 DIAGNOSIS — J9601 Acute respiratory failure with hypoxia: Secondary | ICD-10-CM

## 2022-12-06 DIAGNOSIS — J810 Acute pulmonary edema: Secondary | ICD-10-CM | POA: Diagnosis not present

## 2022-12-06 DIAGNOSIS — E1169 Type 2 diabetes mellitus with other specified complication: Secondary | ICD-10-CM | POA: Diagnosis present

## 2022-12-06 DIAGNOSIS — F419 Anxiety disorder, unspecified: Secondary | ICD-10-CM | POA: Diagnosis present

## 2022-12-06 DIAGNOSIS — I745 Embolism and thrombosis of iliac artery: Secondary | ICD-10-CM | POA: Diagnosis present

## 2022-12-06 DIAGNOSIS — T8612 Kidney transplant failure: Secondary | ICD-10-CM | POA: Diagnosis present

## 2022-12-06 DIAGNOSIS — Z794 Long term (current) use of insulin: Secondary | ICD-10-CM

## 2022-12-06 DIAGNOSIS — H547 Unspecified visual loss: Secondary | ICD-10-CM | POA: Diagnosis present

## 2022-12-06 DIAGNOSIS — E872 Acidosis, unspecified: Secondary | ICD-10-CM | POA: Diagnosis present

## 2022-12-06 DIAGNOSIS — N1832 Chronic kidney disease, stage 3b: Secondary | ICD-10-CM | POA: Diagnosis present

## 2022-12-06 DIAGNOSIS — Z7952 Long term (current) use of systemic steroids: Secondary | ICD-10-CM

## 2022-12-06 DIAGNOSIS — A419 Sepsis, unspecified organism: Secondary | ICD-10-CM | POA: Diagnosis present

## 2022-12-06 DIAGNOSIS — Z87891 Personal history of nicotine dependence: Secondary | ICD-10-CM

## 2022-12-06 DIAGNOSIS — I255 Ischemic cardiomyopathy: Secondary | ICD-10-CM | POA: Diagnosis present

## 2022-12-06 DIAGNOSIS — I4821 Permanent atrial fibrillation: Secondary | ICD-10-CM | POA: Diagnosis present

## 2022-12-06 DIAGNOSIS — E785 Hyperlipidemia, unspecified: Secondary | ICD-10-CM | POA: Diagnosis present

## 2022-12-06 DIAGNOSIS — Z9981 Dependence on supplemental oxygen: Secondary | ICD-10-CM

## 2022-12-06 DIAGNOSIS — Z888 Allergy status to other drugs, medicaments and biological substances status: Secondary | ICD-10-CM

## 2022-12-06 DIAGNOSIS — I447 Left bundle-branch block, unspecified: Secondary | ICD-10-CM | POA: Diagnosis present

## 2022-12-06 DIAGNOSIS — K219 Gastro-esophageal reflux disease without esophagitis: Secondary | ICD-10-CM | POA: Diagnosis present

## 2022-12-06 DIAGNOSIS — T380X5A Adverse effect of glucocorticoids and synthetic analogues, initial encounter: Secondary | ICD-10-CM | POA: Diagnosis present

## 2022-12-06 DIAGNOSIS — R579 Shock, unspecified: Secondary | ICD-10-CM | POA: Diagnosis not present

## 2022-12-06 DIAGNOSIS — Z881 Allergy status to other antibiotic agents status: Secondary | ICD-10-CM

## 2022-12-06 LAB — I-STAT VENOUS BLOOD GAS, ED
Acid-base deficit: 1 mmol/L (ref 0.0–2.0)
Bicarbonate: 24.5 mmol/L (ref 20.0–28.0)
Calcium, Ion: 1.08 mmol/L — ABNORMAL LOW (ref 1.15–1.40)
HCT: 33 % — ABNORMAL LOW (ref 36.0–46.0)
Hemoglobin: 11.2 g/dL — ABNORMAL LOW (ref 12.0–15.0)
O2 Saturation: 89 %
Potassium: 5.2 mmol/L — ABNORMAL HIGH (ref 3.5–5.1)
Sodium: 132 mmol/L — ABNORMAL LOW (ref 135–145)
TCO2: 26 mmol/L (ref 22–32)
pCO2, Ven: 40.8 mmHg — ABNORMAL LOW (ref 44–60)
pH, Ven: 7.386 (ref 7.25–7.43)
pO2, Ven: 58 mmHg — ABNORMAL HIGH (ref 32–45)

## 2022-12-06 LAB — URINALYSIS, ROUTINE W REFLEX MICROSCOPIC
Bilirubin Urine: NEGATIVE
Glucose, UA: NEGATIVE mg/dL
Hgb urine dipstick: NEGATIVE
Ketones, ur: NEGATIVE mg/dL
Nitrite: NEGATIVE
Protein, ur: 100 mg/dL — AB
Specific Gravity, Urine: 1.019 (ref 1.005–1.030)
WBC, UA: >50 WBC/hpf (ref 0–5)
pH: 7 (ref 5.0–8.0)

## 2022-12-06 LAB — COMPREHENSIVE METABOLIC PANEL
ALT: 12 U/L (ref 0–44)
AST: 29 U/L (ref 15–41)
Albumin: 2.8 g/dL — ABNORMAL LOW (ref 3.5–5.0)
Alkaline Phosphatase: 149 U/L — ABNORMAL HIGH (ref 38–126)
Anion gap: 16 — ABNORMAL HIGH (ref 5–15)
BUN: 22 mg/dL (ref 8–23)
CO2: 21 mmol/L — ABNORMAL LOW (ref 22–32)
Calcium: 9.1 mg/dL (ref 8.9–10.3)
Chloride: 96 mmol/L — ABNORMAL LOW (ref 98–111)
Creatinine, Ser: 1.51 mg/dL — ABNORMAL HIGH (ref 0.44–1.00)
GFR, Estimated: 38 mL/min — ABNORMAL LOW (ref >60)
Glucose, Bld: 340 mg/dL — ABNORMAL HIGH (ref 70–99)
Potassium: 4.9 mmol/L (ref 3.5–5.1)
Sodium: 133 mmol/L — ABNORMAL LOW (ref 135–145)
Total Bilirubin: 0.8 mg/dL (ref 0.3–1.2)
Total Protein: 7 g/dL (ref 6.5–8.1)

## 2022-12-06 LAB — SARS CORONAVIRUS 2 BY RT PCR: SARS Coronavirus 2 by RT PCR: NEGATIVE

## 2022-12-06 LAB — CBC WITH DIFFERENTIAL/PLATELET
Abs Immature Granulocytes: 0.07 10*3/uL (ref 0.00–0.07)
Basophils Absolute: 0 10*3/uL (ref 0.0–0.1)
Basophils Relative: 0 %
Eosinophils Absolute: 0.1 10*3/uL (ref 0.0–0.5)
Eosinophils Relative: 1 %
HCT: 41 % (ref 36.0–46.0)
Hemoglobin: 12.7 g/dL (ref 12.0–15.0)
Immature Granulocytes: 1 %
Lymphocytes Relative: 12 %
Lymphs Abs: 1.3 10*3/uL (ref 0.7–4.0)
MCH: 29.1 pg (ref 26.0–34.0)
MCHC: 31 g/dL (ref 30.0–36.0)
MCV: 93.8 fL (ref 80.0–100.0)
Monocytes Absolute: 0.7 10*3/uL (ref 0.1–1.0)
Monocytes Relative: 6 %
Neutro Abs: 8.5 10*3/uL — ABNORMAL HIGH (ref 1.7–7.7)
Neutrophils Relative %: 80 %
Platelets: 316 10*3/uL (ref 150–400)
RBC: 4.37 MIL/uL (ref 3.87–5.11)
RDW: 18.9 % — ABNORMAL HIGH (ref 11.5–15.5)
WBC: 10.6 10*3/uL — ABNORMAL HIGH (ref 4.0–10.5)
nRBC: 0.2 % (ref 0.0–0.2)

## 2022-12-06 LAB — CBG MONITORING, ED: Glucose-Capillary: 305 mg/dL — ABNORMAL HIGH (ref 70–99)

## 2022-12-06 LAB — MAGNESIUM: Magnesium: 1.4 mg/dL — ABNORMAL LOW (ref 1.7–2.4)

## 2022-12-06 LAB — RAPID URINE DRUG SCREEN, HOSP PERFORMED
Amphetamines: NOT DETECTED
Barbiturates: NOT DETECTED
Benzodiazepines: POSITIVE — AB
Cocaine: NOT DETECTED
Opiates: POSITIVE — AB
Tetrahydrocannabinol: NOT DETECTED

## 2022-12-06 LAB — PROTIME-INR
INR: 1.6 — ABNORMAL HIGH (ref 0.8–1.2)
Prothrombin Time: 19 seconds — ABNORMAL HIGH (ref 11.4–15.2)

## 2022-12-06 LAB — TROPONIN I (HIGH SENSITIVITY)
Troponin I (High Sensitivity): 27 ng/L — ABNORMAL HIGH (ref <18)
Troponin I (High Sensitivity): 291 ng/L (ref <18)

## 2022-12-06 LAB — GLUCOSE, CAPILLARY
Glucose-Capillary: 158 mg/dL — ABNORMAL HIGH (ref 70–99)
Glucose-Capillary: 218 mg/dL — ABNORMAL HIGH (ref 70–99)
Glucose-Capillary: 290 mg/dL — ABNORMAL HIGH (ref 70–99)

## 2022-12-06 LAB — POCT I-STAT 7, (LYTES, BLD GAS, ICA,H+H)
Acid-base deficit: 5 mmol/L — ABNORMAL HIGH (ref 0.0–2.0)
Bicarbonate: 20.7 mmol/L (ref 20.0–28.0)
Calcium, Ion: 1.19 mmol/L (ref 1.15–1.40)
HCT: 33 % — ABNORMAL LOW (ref 36.0–46.0)
Hemoglobin: 11.2 g/dL — ABNORMAL LOW (ref 12.0–15.0)
O2 Saturation: 87 %
Patient temperature: 36.6
Potassium: 4.9 mmol/L (ref 3.5–5.1)
Sodium: 134 mmol/L — ABNORMAL LOW (ref 135–145)
TCO2: 22 mmol/L (ref 22–32)
pCO2 arterial: 38.8 mmHg (ref 32–48)
pH, Arterial: 7.332 — ABNORMAL LOW (ref 7.35–7.45)
pO2, Arterial: 55 mmHg — ABNORMAL LOW (ref 83–108)

## 2022-12-06 LAB — TSH: TSH: 1.891 u[IU]/mL (ref 0.350–4.500)

## 2022-12-06 LAB — LACTIC ACID, PLASMA
Lactic Acid, Venous: 4.1 mmol/L (ref 0.5–1.9)
Lactic Acid, Venous: 3.3 mmol/L (ref 0.5–1.9)

## 2022-12-06 LAB — ETHANOL: Alcohol, Ethyl (B): <10 mg/dL (ref <10)

## 2022-12-06 LAB — BRAIN NATRIURETIC PEPTIDE: B Natriuretic Peptide: 657.3 pg/mL — ABNORMAL HIGH (ref 0.0–100.0)

## 2022-12-06 LAB — T4, FREE: Free T4: 1.15 ng/dL — ABNORMAL HIGH (ref 0.61–1.12)

## 2022-12-06 LAB — LIPASE, BLOOD: Lipase: 18 U/L (ref 11–51)

## 2022-12-06 LAB — PROCALCITONIN: Procalcitonin: <0.1 ng/mL

## 2022-12-06 LAB — AMMONIA: Ammonia: 38 umol/L — ABNORMAL HIGH (ref 9–35)

## 2022-12-06 LAB — MRSA NEXT GEN BY PCR, NASAL: MRSA by PCR Next Gen: DETECTED — AB

## 2022-12-06 SURGERY — CORONARY/GRAFT ACUTE MI REVASCULARIZATION
Anesthesia: LOCAL

## 2022-12-06 MED ORDER — PANTOPRAZOLE SODIUM 40 MG PO TBEC
40.0000 mg | DELAYED_RELEASE_TABLET | Freq: Every day | ORAL | Status: DC
  Administered 2022-12-07 – 2022-12-11 (×5): 40 mg via ORAL
  Filled 2022-12-06 (×5): qty 1

## 2022-12-06 MED ORDER — BUDESONIDE 0.5 MG/2ML IN SUSP
2.0000 mL | Freq: Two times a day (BID) | RESPIRATORY_TRACT | Status: DC
  Administered 2022-12-06 – 2022-12-12 (×12): 0.5 mg via RESPIRATORY_TRACT
  Filled 2022-12-06 (×12): qty 2

## 2022-12-06 MED ORDER — PROMETHAZINE HCL 25 MG PO TABS
25.0000 mg | ORAL_TABLET | Freq: Four times a day (QID) | ORAL | Status: DC | PRN
  Administered 2022-12-07 – 2022-12-10 (×6): 25 mg via ORAL
  Filled 2022-12-06 (×8): qty 1

## 2022-12-06 MED ORDER — SODIUM CHLORIDE 0.9 % IV SOLN
2.0000 g | Freq: Two times a day (BID) | INTRAVENOUS | Status: DC
  Administered 2022-12-07 – 2022-12-10 (×7): 2 g via INTRAVENOUS
  Filled 2022-12-06 (×7): qty 12.5

## 2022-12-06 MED ORDER — ROSUVASTATIN CALCIUM 20 MG PO TABS
20.0000 mg | ORAL_TABLET | Freq: Every day | ORAL | Status: DC
  Administered 2022-12-07 – 2022-12-11 (×5): 20 mg via ORAL
  Filled 2022-12-06 (×5): qty 1

## 2022-12-06 MED ORDER — PREDNISONE 5 MG PO TABS
5.0000 mg | ORAL_TABLET | Freq: Every day | ORAL | Status: DC
  Administered 2022-12-07: 5 mg via ORAL
  Filled 2022-12-06: qty 1

## 2022-12-06 MED ORDER — ONDANSETRON HCL 4 MG/2ML IJ SOLN
4.0000 mg | Freq: Four times a day (QID) | INTRAMUSCULAR | Status: DC | PRN
  Administered 2022-12-06 – 2022-12-09 (×5): 4 mg via INTRAVENOUS
  Filled 2022-12-06 (×5): qty 2

## 2022-12-06 MED ORDER — CALCIUM GLUCONATE-NACL 1-0.675 GM/50ML-% IV SOLN: 1.0000 g | Freq: Once | INTRAVENOUS | Status: DC

## 2022-12-06 MED ORDER — ARFORMOTEROL TARTRATE 15 MCG/2ML IN NEBU
15.0000 ug | INHALATION_SOLUTION | Freq: Two times a day (BID) | RESPIRATORY_TRACT | Status: DC
  Administered 2022-12-06 – 2022-12-12 (×12): 15 ug via RESPIRATORY_TRACT
  Filled 2022-12-06 (×11): qty 2

## 2022-12-06 MED ORDER — GABAPENTIN 100 MG PO CAPS
100.0000 mg | ORAL_CAPSULE | Freq: Every day | ORAL | Status: DC | PRN
  Administered 2022-12-08 – 2022-12-10 (×3): 100 mg via ORAL
  Filled 2022-12-06 (×4): qty 1

## 2022-12-06 MED ORDER — AMIODARONE LOAD VIA INFUSION
150.0000 mg | Freq: Once | INTRAVENOUS | Status: AC
  Administered 2022-12-06: 150 mg via INTRAVENOUS
  Filled 2022-12-06: qty 83.34

## 2022-12-06 MED ORDER — SODIUM CHLORIDE 0.9 % IV SOLN
250.0000 mL | INTRAVENOUS | Status: DC
  Administered 2022-12-06: 250 mL via INTRAVENOUS

## 2022-12-06 MED ORDER — AMIODARONE HCL IN DEXTROSE 360-4.14 MG/200ML-% IV SOLN
60.0000 mg/h | INTRAVENOUS | Status: AC
  Administered 2022-12-06 (×2): 60 mg/h via INTRAVENOUS
  Filled 2022-12-06 (×2): qty 200

## 2022-12-06 MED ORDER — ORAL CARE MOUTH RINSE: 15.0000 mL | OROMUCOSAL | Status: DC | PRN

## 2022-12-06 MED ORDER — AZATHIOPRINE 50 MG PO TABS
125.0000 mg | ORAL_TABLET | Freq: Every day | ORAL | Status: DC
  Administered 2022-12-07 – 2022-12-11 (×5): 125 mg via ORAL
  Filled 2022-12-06 (×6): qty 3

## 2022-12-06 MED ORDER — MAGIC MOUTHWASH
10.0000 mL | Freq: Three times a day (TID) | ORAL | Status: DC | PRN
  Administered 2022-12-07 – 2022-12-08 (×2): 10 mL via ORAL
  Filled 2022-12-06 (×4): qty 10

## 2022-12-06 MED ORDER — LAMOTRIGINE 25 MG PO TABS
25.0000 mg | ORAL_TABLET | Freq: Every day | ORAL | Status: DC
  Administered 2022-12-06 – 2022-12-11 (×6): 25 mg via ORAL
  Filled 2022-12-06 (×7): qty 1

## 2022-12-06 MED ORDER — DOCUSATE SODIUM 100 MG PO CAPS: 100.0000 mg | ORAL_CAPSULE | Freq: Two times a day (BID) | ORAL | Status: DC | PRN

## 2022-12-06 MED ORDER — NOREPINEPHRINE 4 MG/250ML-% IV SOLN
INTRAVENOUS | Status: AC
  Administered 2022-12-06: 6 ug/min via INTRAVENOUS
  Filled 2022-12-06: qty 250

## 2022-12-06 MED ORDER — POLYETHYLENE GLYCOL 3350 17 G PO PACK: 17.0000 g | PACK | Freq: Every day | ORAL | Status: DC | PRN

## 2022-12-06 MED ORDER — LORAZEPAM 1 MG PO TABS
1.0000 mg | ORAL_TABLET | Freq: Three times a day (TID) | ORAL | Status: DC | PRN
  Administered 2022-12-07 – 2022-12-08 (×5): 1 mg via ORAL
  Filled 2022-12-06 (×5): qty 1

## 2022-12-06 MED ORDER — MAGNESIUM SULFATE 2 GM/50ML IV SOLN
2.0000 g | Freq: Once | INTRAVENOUS | Status: AC
  Administered 2022-12-06: 2 g via INTRAVENOUS
  Filled 2022-12-06: qty 50

## 2022-12-06 MED ORDER — CLOPIDOGREL BISULFATE 75 MG PO TABS
75.0000 mg | ORAL_TABLET | Freq: Every day | ORAL | Status: DC
  Administered 2022-12-07 – 2022-12-11 (×5): 75 mg via ORAL
  Filled 2022-12-06 (×5): qty 1

## 2022-12-06 MED ORDER — AMIODARONE HCL IN DEXTROSE 360-4.14 MG/200ML-% IV SOLN
30.0000 mg/h | INTRAVENOUS | Status: DC
  Administered 2022-12-06 – 2022-12-08 (×5): 30 mg/h via INTRAVENOUS
  Filled 2022-12-06 (×5): qty 200

## 2022-12-06 MED ORDER — SODIUM CHLORIDE 0.9 % IV SOLN
2.0000 g | Freq: Once | INTRAVENOUS | Status: AC
  Administered 2022-12-06: 2 g via INTRAVENOUS
  Filled 2022-12-06: qty 12.5

## 2022-12-06 MED ORDER — GLUCAGON HCL RDNA (DIAGNOSTIC) 1 MG IJ SOLR: 1.0000 mg | Freq: Once | INTRAMUSCULAR | Status: DC

## 2022-12-06 MED ORDER — SODIUM CHLORIDE 0.9 % IV SOLN: 2.0000 g | Freq: Once | INTRAVENOUS | Status: DC

## 2022-12-06 MED ORDER — SERTRALINE HCL 100 MG PO TABS
200.0000 mg | ORAL_TABLET | Freq: Every day | ORAL | Status: DC
  Administered 2022-12-07 – 2022-12-11 (×5): 200 mg via ORAL
  Filled 2022-12-06 (×5): qty 2

## 2022-12-06 MED ORDER — SODIUM CHLORIDE 0.9 % IV SOLN: 500.0000 mg | Freq: Once | INTRAVENOUS | Status: DC

## 2022-12-06 MED ORDER — INSULIN ASPART 100 UNIT/ML IJ SOLN
0.0000 [IU] | INTRAMUSCULAR | Status: DC
  Administered 2022-12-06: 3 [IU] via SUBCUTANEOUS
  Administered 2022-12-06: 5 [IU] via SUBCUTANEOUS
  Administered 2022-12-06: 8 [IU] via SUBCUTANEOUS
  Administered 2022-12-07: 11 [IU] via SUBCUTANEOUS
  Administered 2022-12-07: 2 [IU] via SUBCUTANEOUS

## 2022-12-06 MED ORDER — ACETAMINOPHEN 325 MG PO TABS
650.0000 mg | ORAL_TABLET | ORAL | Status: DC | PRN
  Administered 2022-12-07: 650 mg via ORAL
  Filled 2022-12-06: qty 2

## 2022-12-06 MED ORDER — VANCOMYCIN HCL IN DEXTROSE 1-5 GM/200ML-% IV SOLN
1000.0000 mg | INTRAVENOUS | Status: DC
  Administered 2022-12-08: 1000 mg via INTRAVENOUS
  Filled 2022-12-06: qty 200

## 2022-12-06 MED ORDER — NOREPINEPHRINE 4 MG/250ML-% IV SOLN
2.0000 ug/min | INTRAVENOUS | Status: DC
  Administered 2022-12-06: 7 ug/min via INTRAVENOUS
  Administered 2022-12-07: 2 ug/min via INTRAVENOUS
  Filled 2022-12-06 (×2): qty 250

## 2022-12-06 MED ORDER — INSULIN GLARGINE-YFGN 100 UNIT/ML ~~LOC~~ SOLN
8.0000 [IU] | Freq: Every day | SUBCUTANEOUS | Status: DC
  Administered 2022-12-06: 8 [IU] via SUBCUTANEOUS
  Filled 2022-12-06 (×3): qty 0.08

## 2022-12-06 MED ORDER — OXYCODONE HCL 5 MG PO TABS
10.0000 mg | ORAL_TABLET | Freq: Three times a day (TID) | ORAL | Status: DC | PRN
  Administered 2022-12-07 – 2022-12-10 (×7): 10 mg via ORAL
  Filled 2022-12-06 (×7): qty 2

## 2022-12-06 MED ORDER — VANCOMYCIN HCL 1250 MG/250ML IV SOLN
1250.0000 mg | Freq: Once | INTRAVENOUS | Status: AC
  Administered 2022-12-06: 1250 mg via INTRAVENOUS
  Filled 2022-12-06: qty 250

## 2022-12-06 MED ORDER — APIXABAN 5 MG PO TABS
5.0000 mg | ORAL_TABLET | Freq: Two times a day (BID) | ORAL | Status: DC
  Administered 2022-12-06 – 2022-12-11 (×11): 5 mg via ORAL
  Filled 2022-12-06 (×11): qty 1

## 2022-12-06 MED ORDER — OXYCODONE HCL 5 MG PO TABS
5.0000 mg | ORAL_TABLET | Freq: Four times a day (QID) | ORAL | Status: DC | PRN
  Administered 2022-12-06: 5 mg via ORAL
  Filled 2022-12-06: qty 1

## 2022-12-06 NOTE — Progress Notes (Signed)
Pharmacy Antibiotic Note  Katherine [name] is a 68 y.o. female admitted on 12/06/2022 presenting with CP, SOB, concern for pna.  Pharmacy has been consulted for vancomycin and cefepime dosing.  Plan: Vancomycin 1250 mg IV x 1, then 1g IV q 48h (eAUC 460) Add MRSA PCR Cefepime 2g IV every 12 hours Monitor renal function, Cx and clinical progression to narrow Vancomycin levels as indicated     No data recorded.  Recent Labs  Lab 12/06/22 1230 12/06/22 1233  CREATININE 1.51*  --   LATICACIDVEN  --  4.1*    Estimated Creatinine Clearance: 35.2 mL/min (A) (by C-G formula based on SCr of 1.51 mg/dL (H)).    Allergies  Allergen Reactions   Tetracycline Hives    Patient tolerated Doxycycline Dec 2020   Niacin Other (See Comments)    Mouth blisters   Niaspan [Niacin Er] Other (See Comments)    Mouth blisters   Sulfa Antibiotics Nausea Only and Other (See Comments)    "Tears up stomach"   Sulfonamide Derivatives Other (See Comments)    Reaction: per patient "tears her stomach up"   Codeine Nausea And Vomiting   Erythromycin Nausea And Vomiting   Hydromorphone Hcl Nausea And Vomiting   Morphine And Related Nausea And Vomiting   Nalbuphine Nausea And Vomiting    Nubain   Sulfasalazine Nausea Only and Other (See Comments)    per patient "tears her stomach up", "Tears up stomach"   Tape Rash and Other (See Comments)    No "plastic" tape," please----cloth tape only    Daylene Posey, PharmD, [facility] Clinical Pharmacist ED Pharmacist Phone # [phone] 12/06/2022 1:54 PM

## 2022-12-06 NOTE — Consult Note (Signed)
Cardiology Consultation   Patient ID: Katherine [name] MRN: [id number]; DOB: 1955/10/04  Admit date: 11/25/2022 Date of Consult: 11/23/2022  PCP:  [name], MD   [facility] Providers Cardiologist:  Bryan Lemma, MD        Patient Profile:   Katherine [name] is a 67 y.o. female with a hx of extensive coronary and vascular disease who is being seen 11/28/2022 for the evaluation of chest pain at the request of Dr Wallace Cullens.  History of Present Illness:   Ms. Yazzie presents today with feeling poorly and having chest discomfort. She has an extremely complex medical history.  She underwent renal transplantation in 1991.  The patient has paroxysmal atrial fibrillation on apixaban, type 2 diabetes, chronic O2 dependent respiratory failure on 5 to 6 L of oxygen by nasal cannula, chronic systolic heart failure with underlying ischemic cardiomyopathy, and history of multivessel CABG.  She has extensive peripheral arterial disease creating technical difficulty for cardiac catheterization.  I reviewed her last 3 heart catheterizations, and she had undergone repeat stenting of the saphenous vein graft, but on her most recent catheterizations she had unsuccessful procedures.  She underwent attempted access from the left femoral artery but there was severe obstructive calcific plaque in the left common iliac artery with inability to pass any catheters beyond this.  The right iliofemoral system is completely occluded.  Her left arm has an AV fistula.  A repeat cardiac catheterization was attempted from right radial access and this was unsuccessful due to extensive diffuse vasospasm with inability to pass any catheters beyond the forearm and also noted postprocedural hematoma.  When EMS arrived at her home today, she was noted to have a heart rate of 140, oxygen saturation of 95%, and blood glucose of 315.  A code STEMI was called, but later canceled by the EDP as it did not meet STEMI  criteria.  The patient is evaluated in the emergency department today.  She is awake and fully intact from a cognitive perspective.  She is able to tell me who her primary care physician, nephrologist, and cardiologist are.  However, her noninvasive blood pressures are reading in the 50s and 60s.  We have tried every extremity and cannot get a blood pressure reading, I am certain this is because of her extensive vascular disease.  She does have evidence of organ hypoperfusion with a lactate of 4.1.  The patient is complaining of chest discomfort and shortness of breath.  She tells me that she has been on 7 L of oxygen recently and she is on an O2 facemask at the time of my evaluation.  States that she has been feeling poorly and this morning started having more chest discomfort and shortness of breath.  She is requesting pain medication and a Coke.  Denies fevers or chills.  Reports that she lives alone.  Past Medical History:  Diagnosis Date   Anemia    Anxiety    Bilateral carotid artery stenosis    Carotid duplex 11/979: 1-39% LICA, 60-79% RICA, >50% RECA, f/u 1 yr suggested   CAD (coronary artery disease) of bypass graft 5/01; 3/'02, 8/'03, 10/'04; 1/15   PCI x 5 to SVG-D1    CAD in native artery 07/1993   3 Vessel Disease (LAD-D1 & RCA) -- CABG (Dx in setting of inferior STEMI-PTCA of RCA)   COPD mixed type    Followed by Dr. Delton Coombes "pulmonologist said no COPD"   Depression with anxiety  Diabetes mellitus type 2 in obese    Diarrhea    started after cholecystectomy and mass removed from intestine   Dyslipidemia, goal LDL below 70    08/2012: TC 137, TG 200, HDL 32!, LDL 45; on statin (followed by Dr.Deterding)   ESRD (end stage renal disease) 1991   s/p Cadaveric Renal Transplant [facility] - Dr. Darrick Penna)    Family history of adverse reaction to anesthesia    mom's bp dropped during/after anesthesia   Fibromyalgia    GERD (gastroesophageal reflux disease)    H/O ST elevation myocardial  infarction (STEMI) of inferoposterior wall 07/1993   H/O: GI bleed    History of kidney stones    History of stroke 2012   "right eye stroke- half blind now"   History of torsades de pointe due to drug 05/11/2021   Witnessed syncopal event.  Had having having lots of nausea and vomiting with poor p.o. intake.  Thought to have QT prolongation with multiple medications involved and hypomagnesemia, hypokalemia.  Tikosyn discontinued along with Zoloft and Phenergan.   Hypertension associated with diabetes    Mild aortic stenosis by prior echocardiogram 07/2019   Echo:  Mild aortic stenosis (gradients: Mean 14.3 mmHg -peak 24.9 mmHg).   Morbid obesity    MRSA (methicillin resistant staph aureus) culture positive    OSA (obstructive sleep apnea)    no longer on CPAP or home O2, states she doesn't need now after lap band   PAD (peripheral artery disease) 08/2013   LEA Dopplers to be read by Dr. Kirke Corin   PAF (paroxysmal atrial fibrillation) 06/2014   Noted on CardioNet Monitor  - --> rhythm control with Tikosyn (Dr. Johney Frame); converted from warfarin to apixaban for anticoagulation.   Pneumonia    Recurrent boils    Bilateral Groin   Rheumatoid arthritis    Per Patient Report; associated with OA   S/p cadaver renal transplant 1991   [facility]    Past Surgical History:  Procedure Laterality Date   ABDOMINAL AORTOGRAM N/A 04/21/2018   Procedure: ABDOMINAL AORTOGRAM;  Surgeon: Marykay Lex, MD;  Location: [facility] INVASIVE CV LAB;  Service: Cardiovascular;  Laterality: N/A;   CATHETER REMOVAL     CHOLECYSTECTOMY N/A 10/29/2014   Procedure: LAPAROSCOPIC CHOLECYSTECTOMY WITH INTRAOPERATIVE CHOLANGIOGRAM;  Surgeon: Glenna Fellows, MD;  Location: [facility] ORS;  Service: General;  Laterality: N/A;   CORONARY ANGIOPLASTY  1994   x5   CORONARY ARTERY BYPASS GRAFT  1995   LIMA-LAD, SVG-RPDA, SVG-D1   CORONARY PRESSURE/FFR STUDY N/A 02/20/2021   Procedure: INTRAVASCULAR PRESSURE WIRE/FFR STUDY;  Surgeon:  Marykay Lex, MD;  Location: [facility] INVASIVE CV LAB;  Service: Cardiovascular;  Laterality: N/A;   CORONARY STENT INTERVENTION N/A 02/20/2021   Procedure: PERCUTANEOUS CORONARY STENT INTERVENTION;  Surgeon: Marykay Lex, MD;  Location: [facility] INVASIVE CV LAB; ostLCx 60% (Neg RFR 0.96);; SVG- D2 recurrent 90% ISR & 95% native D2 after graft-> DES PCI of 95% anastomotic D2 lesion (Onyx Frontier 2.25 mm x 12 mm => 2.75 mm @ overlap, 2.5 distal.);PTCA of ISR in body of graft. ->  2.5 mm scoring balloon & post-dil w/ 2.75 mm Centuria balloon   ESOPHAGOGASTRODUODENOSCOPY N/A 10/15/2016   Procedure: ESOPHAGOGASTRODUODENOSCOPY (EGD);  Surgeon: Charlott Rakes, MD;  Location: [facility] ENDOSCOPY;  Service: Endoscopy;  Laterality: N/A;   I & D EXTREMITY Right 01/29/2018   Procedure: IRRIGATION AND DEBRIDEMENT THUMB;  Surgeon: Knute Neu, MD;  Location: [facility] OR;  Service: Plastics;  Laterality: Right;  INCISE AND DRAIN ABCESS     KIDNEY TRANSPLANT  1991   KNEE ARTHROSCOPY WITH LATERAL MENISECTOMY Left 12/03/2017   Procedure: LEFT KNEE ARTHROSCOPY WITH LATERAL MENISECTOMY;  Surgeon: Frederico Hamman, MD;  Location: [facility] OR;  Service: Orthopedics;  Laterality: Left;   LAPAROSCOPIC GASTRIC BANDING  04/2004; 10/'09, 2/'10   Port Replacement x 2   LEFT HEART CATH AND CORONARY ANGIOGRAPHY N/A 02/20/2021   Procedure: LEFT HEART CATH AND CORONARY ANGIOGRAPHY;  Surgeon: Marykay Lex, MD;  Location: [facility] INVASIVE CV LAB;  Service: Cardiovascular;  Laterality: N/A;   LEFT HEART CATH AND CORS/GRAFTS ANGIOGRAPHY N/A 04/21/2018   Procedure: LEFT HEART CATH AND CORS/GRAFTS ANGIOGRAPHY;  Surgeon: Marykay Lex, MD;  Location: [facility] INVASIVE CV LAB;  Ost-Prox LAD 50% - proxLAD (pre & post D1) 100% CTO. Cx - patent, small OM1 (stable ~ ostial OM1 90%, too small for PCI) & 2 small LPL; Ost-distal RCA 100% CTO.  LIMA-LAD (not injected); SVG-dRCA patent, SVG-D1 - insertion stent ~20% ISR - Severe R CFA disease w/ focal Sub TO   LEFT HEART  CATH AND CORS/GRAFTS ANGIOGRAPHY  5/'01, 3/'02, 8/'03, 10/'04; 1/'15   08/22/2013: LAD & RCA 100%; LIMA-LAD & SVG-rPDA patent; Cx-- OM1 60%, OM2 ostial ~50%; SVG-D1 - 80% mid, 50% distal ISR --PCI   LEFT HEART CATH AND CORS/GRAFTS ANGIOGRAPHY N/A 01/31/2021   Procedure: LEFT HEART CATH AND CORS/GRAFTS ANGIOGRAPHY;  Surgeon: Dolores Patty, MD;  Location: [facility] INVASIVE CV LAB;;   LEFT HEART CATH AND CORS/GRAFTS ANGIOGRAPHY N/A 09/05/2021   Procedure: LEFT HEART CATH AND CORS/GRAFTS ANGIOGRAPHY;  Surgeon: Marykay Lex, MD;  Location: [facility] INVASIVE CV LAB;  Service: Cardiovascular;  Laterality: N/A;   LEFT HEART CATH AND CORS/GRAFTS ANGIOGRAPHY N/A 08/21/2021   Procedure: LEFT HEART CATH AND CORS/GRAFTS ANGIOGRAPHY;  Surgeon: Lyn Records, MD;  Location: [facility] INVASIVE CV LAB;  Service: Cardiovascular;  Laterality: N/A;   LEFT HEART CATHETERIZATION WITH CORONARY/GRAFT ANGIOGRAM N/A 08/23/2013   Procedure: LEFT HEART CATHETERIZATION WITH Isabel Caprice;  Surgeon: Iran Ouch, MD;  Location: [facility] CATH LAB;  Service: Cardiovascular;  Laterality: N/A;   Lower Extremity Arterial Dopplers  08/2013   ABI: R 0.96, L 1.04   MULTIPLE TOOTH EXTRACTIONS  age 35   NM MYOVIEW LTD  03/2016   EF 62%. LOW RISK. C/W prior MI - no Ischemia. Apical hypokinesis.   PERCUTANEOUS CORONARY STENT INTERVENTION (PCI-S)  5/'01, 3/'02, 8/'03, 10/'04;   '01 - S660 BMS 2.5 x 9 - dSVG-D1 into D1; '02- post-stent stenosis - 2.5 x 8 Pixel BMS; '8\03: ISR/Thrombosis into native D1 - AngioJet, 2.5 x 13 Pixel; '04 - ISR 95% - covered stented area with Taxus DES 2.5 mm x 20 (2.88)   PERCUTANEOUS CORONARY STENT INTERVENTION (PCI-S)  08/23/2013   Procedure: PERCUTANEOUS CORONARY STENT INTERVENTION (PCI-S);  Surgeon: Iran Ouch, MD;  Location: [facility] CATH LAB;  Service: Cardiovascular;;mid SVG-D1 80%; distal stent ~50% ISR; Promus Prermier DES 2.75 mm xc 20 mm (2.8 mm)   PORT-A-CATH REMOVAL     kidney   TRANSTHORACIC  ECHOCARDIOGRAM  07/2019   a) 07/2019: EF 55 to 60%.  No LVH.  Paradoxical septal WM-s/p CABG.  GRII DD.  Nl RV size and fxn.  Mild bilateral atrial dilation.  Mod MAC.  Trace MR.  Mild AS (gradients: Mean 14.3 mmHg -peak 24.9 mmHg).; B) 06/2020: EF 40 to 45%.  Moderate concentric LVH.  GRII DD.  Elevated LAP.  Mod HK mid Apical Ant-AntSept  wall & mild Apical Dyskinesis.  Mod LA dilation.  Mild MR.  AoV sclerosis w/o AS.   TRANSTHORACIC ECHOCARDIOGRAM  01/30/2021   EF 55 to 60%.  Mild LVH.  GR 1 DD.  Elevated LAP.  Moderate LA dilation.  Mild MR with mild MS.  Mild aortic valve stenosis.:   TUBAL LIGATION     wrist fistula repair Left    dialysis for one year     Home Medications:  Prior to Admission medications   Medication Sig Start Date End Date Taking? Authorizing Provider  albuterol (VENTOLIN HFA) 108 (90 Base) MCG/ACT inhaler TAKE 2 PUFFS BY MOUTH EVERY 6 HOURS AS NEEDED FOR WHEEZE OR SHORTNESS OF BREATH Patient taking differently: Inhale 2 puffs into the lungs every 6 (six) hours as needed for shortness of breath. 07/30/22   Leslye Peer, MD  arformoterol (BROVANA) 15 MCG/2ML NEBU Take 2 mLs (15 mcg total) by nebulization 2 (two) times daily. 08/30/19   Leslye Peer, MD  azaTHIOprine (IMURAN) 50 MG tablet Take 2 & 1/2 tablets (125 mg) by mouth daily at 3pm Patient taking differently: Take 125 mg by mouth daily. 10/21/21   Pokhrel, Rebekah Chesterfield, MD  bisacodyl (DULCOLAX) 5 MG EC tablet Take 1 tablet (5 mg total) by mouth daily as needed for moderate constipation. 10/05/21   Drema Dallas, MD  budesonide (PULMICORT) 0.5 MG/2ML nebulizer solution Take 2 mLs (0.5 mg total) by nebulization 2 (two) times daily. Patient taking differently: Take 2 mLs by nebulization 2 (two) times daily as needed (wheezing). 08/24/19   [name], MD  calcitRIOL (ROCALTROL) 0.25 MCG capsule Take 0.25 mcg by mouth See admin instructions. Takes every 3 days.    [provider]  clopidogrel (PLAVIX) 75 MG  tablet Take 1 tablet (75 mg total) by mouth daily. 11/11/22 01/10/23  Noralee Stain, DO  Continuous Blood Gluc Receiver (FREESTYLE LIBRE 2 READER) DEVI 1 Act by Does not apply route daily. 06/17/22   [name], MD  Continuous Blood Gluc Sensor (FREESTYLE LIBRE 2 SENSOR) MISC 1 Act by Does not apply route daily. 06/17/22   [name], MD  Continuous Blood Gluc Sensor (FREESTYLE LIBRE 3 SENSOR) MISC 1 Units by Does not apply route 3 (three) times daily. Place 1 sensor on the skin every 14 days. Use to check glucose continuously 11/26/22   Hughie Closs, MD  CVS VITAMIN C 500 MG tablet TAKE 1 TABLET (500 MG TOTAL) BY MOUTH DAILY. Patient taking differently: Take 500 mg by mouth daily. 02/03/22   [name], MD  cyclobenzaprine (FLEXERIL) 10 MG tablet TAKE 1 TABLET BY MOUTH TWICE A DAY AS NEEDED FOR MUSCLE SPASM 12/03/22   [name], MD  ELIQUIS 5 MG TABS tablet TAKE 1 TABLET BY MOUTH TWICE A DAY 10/22/22   [name], MD  empagliflozin (JARDIANCE) 10 MG TABS tablet Take 1 tablet (10 mg total) by mouth daily. 11/11/22 01/10/23  Noralee Stain, DO  Ensure Max Protein (ENSURE MAX PROTEIN) LIQD Take 330 mLs (11 oz total) by mouth 2 (two) times daily. 12/12/21   Narda Bonds, MD  fluticasone (FLONASE) 50 MCG/ACT nasal spray PLACE 2 SPRAYS INTO BOTH NOSTRILS 2 TIMES DAILY. Patient taking differently: Place 2 sprays into both nostrils 2 (two) times daily. 08/04/22   [name], MD  furosemide (LASIX) 40 MG tablet Take 1 tablet (40 mg total) by mouth 2 (two) times daily. 11/26/22   Hughie Closs, MD  gabapentin (  NEURONTIN) 100 MG capsule Take 1 capsule (100 mg total) by mouth at bedtime. Patient taking differently: Take 100 mg by mouth daily as needed (For pain). 09/11/21   Kathlen Mody, MD  glucose blood (GLUCOCOM TEST) test strip Use as instructed 11/26/22   Hughie Closs, MD  guaiFENesin-dextromethorphan (ROBITUSSIN DM) 100-10 MG/5ML syrup Take 15 mLs by mouth every 4 (four) hours as  needed for cough. 11/23/22   Kathlen Mody, MD  hydrocortisone (ANUSOL-HC) 25 MG suppository Place 1 suppository (25 mg total) rectally 2 (two) times daily. 11/23/22   Kathlen Mody, MD  insulin glargine-yfgn (SEMGLEE) 100 UNIT/ML injection Inject 0.08 mLs (8 Units total) into the skin daily. 11/24/22   Kathlen Mody, MD  Insulin Pen Needle (PEN NEEDLES) 32G X 4 MM MISC Use as needed to administer insulin. E11.9 02/01/22   [name], MD  isosorbide mononitrate (IMDUR) 30 MG 24 hr tablet Take 1 tablet (30 mg total) by mouth daily. 11/12/22   Almon Hercules, MD  KLOR-CON M10 10 MEQ tablet TAKE 1 TABLET BY MOUTH 2 TIMES DAILY. 09/19/22   [name], MD  lamoTRIgine (LAMICTAL) 25 MG tablet TAKE 1 TABLET BY MOUTH EVERYDAY AT BEDTIME Patient taking differently: Take 25 mg by mouth daily. 02/19/22   Ranelle Oyster, MD  Lancets (FREESTYLE) lancets Use as directed up to four times daily 11/26/22   Hughie Closs, MD  metFORMIN (GLUCOPHAGE) 500 MG tablet Take 500 mg by mouth 2 (two) times daily. 12/08/21   [provider]  metoprolol tartrate (LOPRESSOR) 50 MG tablet Take 1 tablet (50 mg total) by mouth 2 (two) times daily. 11/10/22 01/09/23  Noralee Stain, DO  montelukast (SINGULAIR) 10 MG tablet TAKE 1 TABLET BY MOUTH EVERYDAY AT BEDTIME Patient taking differently: Take 10 mg by mouth at bedtime. 05/04/22   Leslye Peer, MD  Multiple Vitamin (MULTIVITAMIN WITH MINERALS) TABS tablet Take 1 tablet by mouth daily. 12/13/21   Narda Bonds, MD  nitroGLYCERIN (NITROSTAT) 0.4 MG SL tablet PLACE 1 TAB UNDER TONGUE EVERY FOR CHEST PAIN Patient taking differently: Place 0.4 mg under the tongue every 5 (five) minutes as needed for chest pain. 06/30/22   Marykay Lex, MD  oxyCODONE (OXY IR/ROXICODONE) 5 MG immediate release tablet Take 1 tablet (5 mg total) by mouth every 6 (six) hours as needed for severe pain. 12/02/22   [name], MD  pantoprazole (PROTONIX) 40 MG tablet TAKE 1 TABLET BY  MOUTH EVERY DAY 08/05/22   [name], MD  predniSONE (DELTASONE) 5 MG tablet Take 5 mg by mouth daily with breakfast.    [provider]  promethazine (PHENERGAN) 25 MG tablet Take 1 tablet (25 mg total) by mouth every 6 (six) hours as needed for nausea or vomiting. 05/20/22   Loyola Mast, MD  ranolazine (RANEXA) 1000 MG SR tablet TAKE 1 TABLET BY MOUTH TWICE A DAY 10/31/22   [name], MD  rosuvastatin (CRESTOR) 20 MG tablet TAKE 1 TABLET BY MOUTH EVERY DAY 07/29/22   Marykay Lex, MD  sertraline (ZOLOFT) 100 MG tablet Take 2 tablets (200 mg total) by mouth daily. 06/16/22   [name], MD  witch hazel-glycerin (TUCKS) pad Apply topically as needed for itching. 11/23/22   Kathlen Mody, MD    Inpatient Medications: Scheduled Meds:  Continuous Infusions:  PRN Meds:   Allergies:    Allergies  Allergen Reactions   Tetracycline Hives    Patient tolerated Doxycycline  Dec 2020   Niacin Other (See Comments)    Mouth blisters   Niaspan [Niacin Er] Other (See Comments)    Mouth blisters   Sulfa Antibiotics Nausea Only and Other (See Comments)    "Tears up stomach"   Sulfonamide Derivatives Other (See Comments)    Reaction: per patient "tears her stomach up"   Codeine Nausea And Vomiting   Erythromycin Nausea And Vomiting   Hydromorphone Hcl Nausea And Vomiting   Morphine And Related Nausea And Vomiting   Nalbuphine Nausea And Vomiting    Nubain   Sulfasalazine Nausea Only and Other (See Comments)    per patient "tears her stomach up", "Tears up stomach"   Tape Rash and Other (See Comments)    No "plastic" tape," please----cloth tape only    Social History:   Social History   Socioeconomic History   Marital status: Widowed    Spouse name: Not on file   Number of children: 0   Years of education: Not on file   Highest education level: High school graduate  Occupational History   Occupation: Disability    Comment: since 1989  Tobacco Use    Smoking status: Former    Packs/day: 1.00    Years: 30.00    Additional pack years: 0.00    Total pack years: 30.00    Types: Cigarettes    Quit date: 08/17/2002    Years since quitting: 20.3   Smokeless tobacco: Never  Vaping Use   Vaping Use: Never used  Substance and Sexual Activity   Alcohol use: No   Drug use: No   Sexual activity: Not Currently  Other Topics Concern   Not on file  Social History Narrative   ** Merged History Encounter ** She is currently married, and the caregiver of her husband who is recovering from surgery for tongue cancer now diagnosed with lung cancer. Prior to his diagnosis of her husband, she actually had adopted a 70-year-old child who she knows caring for as well. With all the surgeries, they have been quite financially troubled. Thanks the help of her community and church, they have been able to stay "alfoat."     She is a former smoker who quit in 2004 after a 30-pack-year history.   She is active chasing a 33-year-old child, does not do routine exercise. She's been quite depressed with the condition of her husband, and admits to eating comfort herself.   She does not drink alcohol.      05/13/2021 Patient reports that her husband is deceased. She is grieving the loss of her husband and the decline in her physical independence.    Social Determinants of Health   Financial Resource Strain: Not on file  Food Insecurity: No Food Insecurity (11/24/2022)   Hunger Vital Sign    Worried About Running Out of Food in the Last Year: Never true    Ran Out of Food in the Last Year: Never true  Transportation Needs: Unmet Transportation Needs (11/24/2022)   PRAPARE - Administrator, Civil Service (Medical): Yes    Lack of Transportation (Non-Medical): Yes  Physical Activity: Not on file  Stress: Not on file  Social Connections: Not on file  Intimate Partner Violence: Not At Risk (11/24/2022)   Humiliation, Afraid, Rape, and Kick questionnaire    Fear of  Current or Ex-Partner: No    Emotionally Abused: No    Physically Abused: No    Sexually Abused: No    Family  History:   Family History  Problem Relation Age of Onset   Cancer Mother        liver   Heart disease Father    Cancer Father        colon   Arrhythmia Brother        Atrial Fibrillation   Arrhythmia Paternal Aunt        Atrial Fibrillation     ROS:  Please see the history of present illness.  All other ROS reviewed and negative.     Physical Exam/Data:   Vitals:   12/08/2022 1224 11/21/2022 1250 12/14/2022 1302 11/29/2022 1305  BP: (!) 84/50 (!) 60/30 (!) 82/54 (!) 92/55  Pulse: (!) 140 (!) 142  (!) 147  Resp: 18 (!) 21  16  SpO2: 96% 100%  100%   No intake or output data in the 24 hours ending 12/03/2022 1345    11/28/2022    8:01 AM 11/26/2022    6:35 AM 11/25/2022    5:31 AM  Last 3 Weights  Weight (lbs) 173 lb 193 lb 2 oz 197 lb 1.5 oz  Weight (kg) 78.472 kg 87.6 kg 89.4 kg     There is no height or weight on file to calculate BMI.  General: Chronically ill-appearing obese woman in no distress HEENT: normal Neck: no JVD Vascular: No carotid bruits; Distal pulses nonpalpable in all extremities Cardiac: Tachycardic and regular, distant heart sounds, no murmur appreciated Lungs: Diminished bilaterally with inspiratory crackles Abd: soft, nontender, no hepatomegaly  Ext: no edema Musculoskeletal:  No deformities, BUE and BLE strength normal and equal Skin: warm and dry  Neuro:  CNs 2-12 intact, no focal abnormalities noted Psych:  Normal affect   EKG:  The EKG was personally reviewed and demonstrates: Sinus tachycardia with left bundle branch block, heart rate 130 bpm  Telemetry:  Telemetry was personally reviewed and demonstrates:  sinus tachycardia  Relevant CV Studies: Cardiac Studies & Procedures   CARDIAC CATHETERIZATION  CARDIAC CATHETERIZATION 09/05/2021  Narrative Unsuccessful attempt at Left Heart Catheterization with Coronary and  GraftAngiography via radial access.  Extensive radial artery spasm, unable to pass catheter beyond elbow. With wire removal, there was evidence of forearm hematoma being held with manual pressure. Only other potential option for angiography would be to consider ulnar access on the right arm versus brachial arterial access at a later date.  Bryan Lemma, MD  Findings Coronary Findings Diagnostic  Dominance: Right  Left Main Not able to engage  Right Coronary Artery Not able to engage along with grafts.  Intervention  No interventions have been documented.   CARDIAC CATHETERIZATION  CARDIAC CATHETERIZATION 09/08/2021  Narrative CONCLUSIONS: Bilateral iliofemoral obstruction preventing catheterization.  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 Assumed patency of the left internal mammary over the last 2-3 heart catheterizations of the been performed. Prior hemodialysis access in the left arm preventing access. Prior coronary angiography over the past 2 years has been from right radial with native right coronary, left coronary, and saphenous vein bypass graft angiography and PCI performed from that approach.  RECOMMENDATIONS:  Medical therapy for coronary disease.  Elevated troponin in the setting of COVID-pneumonia given her underlying coronary disease could be ascribed to demand ischemia. The graft to the diagonal contains overlapping DES x2 layers with distal anastomosis ISR that was recently dilated in July.  This could be the culprit for ongoing angina.  It seems unreasonable to continue  to dilate in this territory without a long-term solution. Would not perform coronary angiography again in the future unless the patient is having clear-cut ACS/STEMI.  If in that situation the only recourse would be to use right radial approach as vascular access. Discussed with Dr. Jens Som who will try to uptitrate  the patient's medical regimen. Discussed in detail with the patient the limited options that we have for her coronary subset.     ECHOCARDIOGRAM  ECHOCARDIOGRAM COMPLETE 11/09/2022  Narrative ECHOCARDIOGRAM REPORT    Patient Name:   Katherine [name] Date of Exam: 11/09/2022 Medical Rec #:  [id number]          Height:       61.0 in Accession #:    [id number]         Weight:       177.3 lb Date of Birth:  June 23, 1956          BSA:          1.794 m Patient Age:    66 years           BP:           136/69 mmHg Patient Gender: F                  HR:           102 bpm. Exam Location:  Inpatient  Procedure: 2D Echo, Cardiac Doppler and Color Doppler  Indications:    Chest Pain R07.9  History:        Patient has prior history of Echocardiogram examinations, most recent 12/09/2021. CHF, CAD, PAD and COPD, Arrythmias:LBBB, Atrial Fibrillation, Tachycardia and Bradycardia, Signs/Symptoms:Chest Pain; Risk Factors:Hypertension, Diabetes, Dyslipidemia and Sleep Apnea. ESRD.  Sonographer:    Lucendia Herrlich Referring Phys: [id number] JENNIFER CHOI   Sonographer Comments: Image acquisition challenging due to uncooperative patient. IMPRESSIONS   1. Technically difficult study with limited visualization of cardiac structures. 2. The LV is not well visualized. Overall EF seems to be in the 35-40% range with dyskinesis of the mid to distal inferoseptal wall and entire apex. The anterior wall is never imaged well 3. Right ventricular systolic function is normal. The right ventricular size is normal. 4. The mitral valve is normal in structure. Mild mitral valve regurgitation. No evidence of mitral stenosis. Moderate mitral annular calcification. 5. The aortic valve is moderately calcified with fusion of the right and left coronary cusps. The aortic valve is tricuspid. There is moderate calcification of the aortic valve. Aortic valve regurgitation is trivial. Mild aortic valve stenosis. Aortic valve  area, by VTI measures 1.65 cm. Aortic valve mean gradient measures 9.0 mmHg. Aortic valve Vmax measures 2.13 m/s. 6. The inferior vena cava is normal in size with greater than 50% respiratory variability, suggesting right atrial pressure of 3 mmHg. 7. Left atrial size was moderately dilated. 8. Aortic dilatation noted. There is borderline dilatation of the aortic root, measuring 38 mm.  FINDINGS Left Ventricle: The LV is not well visualized. Overall EF seems to be in the 35-40% range with dyskinesis of the mid to distal inferoseptal wall and entire apex. The anterior wall is never imaged well. Left ventricular ejection fraction, by estimation, is 35 to 40%. The left ventricle has moderately decreased function. The left ventricle demonstrates regional wall motion abnormalities. The left ventricular internal cavity size was normal in size. There is mild concentric left ventricular hypertrophy. Left ventricular diastolic parameters are indeterminate.  Right Ventricle: The right ventricular size is normal.  No increase in right ventricular wall thickness. Right ventricular systolic function is normal.  Left Atrium: Left atrial size was moderately dilated.  Right Atrium: Right atrial size was normal in size.  Pericardium: There is no evidence of pericardial effusion.  Mitral Valve: The mitral valve is normal in structure. Moderate mitral annular calcification. Mild mitral valve regurgitation. No evidence of mitral valve stenosis. MV peak gradient, 9.6 mmHg. The mean mitral valve gradient is 4.0 mmHg.  Tricuspid Valve: The tricuspid valve is normal in structure. Tricuspid valve regurgitation is trivial. No evidence of tricuspid stenosis.  Aortic Valve: The aortic valve is moderately calcified with fusion of the right and left coronary cusps. The aortic valve is tricuspid. There is moderate calcification of the aortic valve. Aortic valve regurgitation is trivial. Mild aortic stenosis is present.  Aortic valve mean gradient measures 9.0 mmHg. Aortic valve peak gradient measures 18.1 mmHg. Aortic valve area, by VTI measures 1.65 cm.  Pulmonic Valve: The pulmonic valve was normal in structure. Pulmonic valve regurgitation is not visualized. No evidence of pulmonic stenosis.  Aorta: Aortic dilatation noted. There is borderline dilatation of the aortic root, measuring 38 mm.  Venous: The inferior vena cava is normal in size with greater than 50% respiratory variability, suggesting right atrial pressure of 3 mmHg.  IAS/Shunts: No atrial level shunt detected by color flow Doppler.   LEFT VENTRICLE PLAX 2D LVIDd:         4.60 cm   Diastology LVIDs:         3.40 cm   LV e' lateral:   9.90 cm/s LV PW:         1.10 cm   LV E/e' lateral: 12.4 LV IVS:        1.10 cm LVOT diam:     2.20 cm LV SV:         55 LV SV Index:   31 LVOT Area:     3.80 cm   RIGHT VENTRICLE            IVC RV S prime:     7.71 cm/s  IVC diam: 1.50 cm TAPSE (M-mode): 1.5 cm  LEFT ATRIUM           Index        RIGHT ATRIUM           Index LA diam:      4.20 cm 2.34 cm/m   RA Area:     12.90 cm LA Vol (A4C): 87.3 ml 48.65 ml/m  RA Volume:   27.10 ml  15.10 ml/m AORTIC VALVE AV Area (Vmax):    1.90 cm AV Area (Vmean):   1.81 cm AV Area (VTI):     1.65 cm AV Vmax:           212.67 cm/s AV Vmean:          135.667 cm/s AV VTI:            0.333 m AV Peak Grad:      18.1 mmHg AV Mean Grad:      9.0 mmHg LVOT Vmax:         106.25 cm/s LVOT Vmean:        64.600 cm/s LVOT VTI:          0.144 m LVOT/AV VTI ratio: 0.43  AORTA Ao Root diam: 3.30 cm Ao Asc diam:  3.75 cm  MITRAL VALVE  TRICUSPID VALVE MV Area VTI:  1.91 cm      TR Peak grad:   25.4 mmHg MV Peak grad: 9.6 mmHg      TR Vmax:        252.00 cm/s MV Mean grad: 4.0 mmHg MV Vmax:      1.55 m/s      SHUNTS MV Vmean:     91.7 cm/s     Systemic VTI:  0.14 m MV E velocity: 123.00 cm/s  Systemic Diam: 2.20 cm  Arvilla Meres  MD Electronically signed by Arvilla Meres MD Signature Date/Time: 11/09/2022/5:04:46 PM    Final    MONITORS  LONG TERM MONITOR (3-14 DAYS) 08/20/2021  Narrative  Predominant rhythm is sinus rhythm. Rate range 42- 108 bpm with an average of 64 bpm.  To short runs of NSVT: Fastest 9 beats-148 bpm, also longest.  Roughly 12 atrial runs noted: Fastest was 5 beats-150 bpm, longest 14 seconds at 119 bpm.  No AV block noted.  Rare PACs and PVCs noted. Couplets and triplets also noted.  Longest ventricular bigeminy spell 13 seconds, longes ventricular t trigeminy spell 10 seconds   Patch Wear Time:  11 days and 16 hours (2022-10-27T20:43:43-0400 to 2022-11-08T12:15:44-0500)    Bryan Lemma, MD    CARDIAC MRI  MR CARDIAC MORPHOLOGY W WO CONTRAST 12/27/2019  Narrative CLINICAL DATA:  67 year old with acute diastolic CHF, CAD, post renal transplant, atrial fibrillation being evaluated for a possible myocarditis.  EXAM: CARDIAC MRI  TECHNIQUE: The patient was scanned on a 1.5 Tesla GE magnet. A dedicated cardiac coil was used. Functional imaging was done using Fiesta sequences. 2,3, and 4 chamber views were done to assess for RWMA's. Modified Simpson's rule using a short axis stack was used to calculate an ejection fraction on a dedicated work [employer]. The patient received 10 cc of Gadavist. After 10 minutes inversion recovery sequences were used to assess for infiltration and scar tissue.  CONTRAST:  10 cc  of Gadavist  FINDINGS: 1. Normal left ventricular size, with mild basal septal hypertrophy and normal systolic function (LVEF = 56%). There is hypokinesis of the mid anteroseptal, anterior and akinesis of the apical septal, anterior and inferior walls. There is late gadolinium enhancement in the mid anteroseptal, anterior walls (50-75%) and in the apical septal, anterior and inferior walls (75-100%).  LVEDD: 50 mm  LVESD: 46  mm  LVEDV: 144 ml  LVESV: 64 ml  SV: 80 ml  CO: 4.3 L/min  Myocardial mass: 122 g  2. Normal right ventricular size, thickness and systolic function (LVEF = 62%). There are no regional wall motion abnormalities.  3.  Moderately dilated left atrium. Normal right atrial size.  4. Normal size of the aortic root, ascending aorta. Mildly dilated pulmonary artery measuring 31 mm.  5.  Mild mitral and trivial tricuspid regurgitation.  6.  Normal pericardium.  No pericardial effusion.  IMPRESSION: 1. Normal left ventricular size, with mild basal septal hypertrophy and normal systolic function (LVEF = 56%). There is hypokinesis of the mid anteroseptal, anterior and akinesis of the apical septal, anterior and inferior walls. There is late gadolinium enhancement in the mid anteroseptal, anterior walls (50-75%) and in the apical septal, anterior and inferior walls (75-100%).  2. Normal right ventricular size, thickness and systolic function (LVEF = 62%). There are no regional wall motion abnormalities.  3.  Moderately dilated left atrium. Normal right atrial size.  4. Normal size of the aortic root, ascending aorta. Mildly  dilated pulmonary artery measuring 31 mm.  5.  Mild mitral and trivial tricuspid regurgitation.  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 Overall LVEF is preserved. There is no evidence for an acute myocarditis.   Electronically Signed By: Tobias Alexander On: 12/27/2019 18:02          Laboratory Data:  High Sensitivity Troponin:   Recent Labs  Lab 11/23/22 2130 11/26/22 1104 11/26/22 1326 11/28/22 0910 11/28/22 1125  TROPONINIHS 26* 28* 27* 28* 27*     Chemistry Recent Labs  Lab 12/07/2022 1252  NA 132*  K 5.2*    No results for input(s): "PROT", "ALBUMIN", "AST", "ALT", "ALKPHOS", "BILITOT" in the last 168 hours. Lipids No results for input(s): "CHOL", "TRIG", "HDL", "LABVLDL", "LDLCALC",  "CHOLHDL" in the last 168 hours.  Hematology Recent Labs  Lab 12/03/2022 1252  HGB 11.2*  HCT 33.0*   Thyroid  Recent Labs  Lab 11/23/2022 1231  TSH 1.891    BNP Recent Labs  Lab 11/24/2022 1231  BNP 657.3*    DDimer No results for input(s): "DDIMER" in the last 168 hours.   Radiology/Studies:  DG Chest Portable 1 View  Result Date: 11/20/2022 CLINICAL DATA:  Chest pain. EXAM: PORTABLE CHEST 1 VIEW COMPARISON:  Chest radiograph dated 11/28/2022. FINDINGS: The heart is enlarged. Vascular calcifications are seen in the aortic arch. Median sternotomy wires are redemonstrated with multiple wire fractures. Mild diffuse bilateral interstitial opacities are noted. There is mild bibasilar atelectasis. No pleural effusion or pneumothorax. Degenerative changes are seen in the spine. IMPRESSION: 1. Mild diffuse bilateral interstitial opacities, which may represent pulmonary edema or atypical infection superimposed on chronic interstitial lung disease. 2. Cardiomegaly. Electronically Signed   By: Romona Curls M.D.   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  I have reviewed her last 3 heart catheterizations and there is no way to safely perform cardiac catheterization in this patient.  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  The left arm has an old AV fistula.  The right arm has nonpalpable pulses and recent catheterization performed unsuccessfully from right arm access.  She has clinical evidence of shock with hypotension and elevated lactate.  She is currently requiring norepinephrine, complicated with the fact that her blood pressure is an accurate.  Even with invasive arterial monitoring it will be difficult to get an accurate blood pressure reading.  She is tachycardic with no change in her left bundle branch morphology of QRS.  It is difficult to know if this is sinus tachycardia or an atrial dysrhythmia.  I think it is reasonable to try amiodarone for rate control and/or cardioversion if this is an atrial dysrhythmia.  She tells me that she has been taking her apixaban.  Would also treat her with IV heparin for conservative management of acute coronary syndrome.  Suspect she will require ICU admission.  I think goals of care will need to be addressed as the patient appears to have very limited treatment options in the context of advanced stage cardiopulmonary disease on chronic O2 with extensive underlying comorbidity as detailed.   Risk Assessment/Risk Scores:     TIMI Risk Score for Unstable Angina or Non-ST Elevation MI:   The patient's TIMI risk score is 6, which indicates a 41% risk of all cause mortality, new or recurrent myocardial infarction or need for urgent revascularization in the next 14 days.  New York Heart Association (NYHA) Functional Class NYHA Class IV  CHA2DS2-VASc Score = 6   This indicates a 9.7% annual risk of stroke. The patient's score is based upon: CHF History: 1 HTN History: 1 Diabetes History: 1 Stroke History: 0 Vascular Disease History: 1 Age Score: 1 Gender Score: 1         For questions or updates, please contact [facility] Please consult [email or web address] for contact info under    Signed, Tonny Bollman, MD  11/30/2022 1:45 PM

## 2022-12-06 NOTE — Progress Notes (Signed)
Patient has old fistula on Lt. Lower Arm with mepilex dressing and Rt. Lower anterior fore arm is ecchymotic. There was no suitable vein for vasopressors. Vasopressor is infusing on Lt. AC. Informed patient's RN that vasopressor supposed to be infusing on USG lower arm as well as no suitable veins for USG PIV. HS [employer]

## 2022-12-06 NOTE — ED Triage Notes (Addendum)
Patient BIBA from home for chest pain starting at 1055, patient unable to give a number or describe the pain to EMS but did take nitro and aspirin prior to their arrival. Patient wears 7L [place] at baseline, currently displays RBBB on EKG. HR 140, O2 99% on 7L, CBG 315. Pt A&Ox4, Patient requested sedation from EMS and required 45 mins on scene to get out of the house.

## 2022-12-06 NOTE — Consult Note (Addendum)
NAME:  Katherine [name], MRN:  [id number], DOB:  03-21-1956, LOS: 0 ADMISSION DATE:  11/25/2022, CONSULTATION DATE:  12/10/2022  REFERRING MD:  Maebelle Munroe, CHIEF COMPLAINT:  chest pain   History of Present Illness:  67 year old woman with chronic respiratory failure on 7 L oxygen at home, HFrEF, known CAD and severe PAD brought in by EMS today for chest discomfort and generalized weakness.  She denies fevers or cough. EMS found her to have heart rate of 140, oxygen saturation 95%, CBG 315, brought in as code STEMI.  Initial EKG showed left bundle branch block, unable to discern whether this was atrial rhythm.  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 COPD/ILD -followed by Dr. Delton Coombes HFrEF, EF 35 to 40% on last echo Anterior MI - 1994 s/p PTCA, CABGx3 1995 Persistent AF - on eliquis, tikosyn Renal transplantation 1991, now CKD 3b Gastric bypass   Significant Hospital Events: Including procedures, antibiotic start and stop dates in addition to other pertinent events     Interim History / Subjective:  Feels much improved Able to speak in full sentences Chest pain is resolved  Objective   Blood pressure (!) 165/116, pulse 99, temperature 97.6 F (36.4 C), temperature source Oral, resp. rate 17, SpO2 100 %.        Intake/Output Summary (Last 24 hours) at 12/14/2022 1517 Last data filed at 11/21/2022  1430 Gross per 24 hour  Intake 100 ml  Output --  Net 100 ml   There were no vitals filed for this visit.  Examination: General: Elderly woman, ED stretcher, on nonrebreather, thirsty HENT: No JVD, mild pallor, no icterus Lungs: No rhonchi, no accessory muscle use, dry crackles left base Cardiovascular: S1-S2 irregular, distant, no murmur Abdomen: Soft, nontender, no hepatosplenomegaly Extremities: Left AV graft no thrill, right radial pulse not palpable, cool feet, unable to palpate pulses Neuro: Alert, interactive, nonfocal  EKG shows left bundle branch pattern, unclear rhythm   Resolved Hospital Problem list     Assessment & Plan:  Her assessment is difficult since no acute Blood pressure is available.  She had a heart rate blood pressure in the 60s but now is reading 160s on cuff blood pressure.  Her chest pain is now resolved she could possibly have had acute chest syndrome but troponins are flat.  EKG shows baseline left bundle branch block, rhythm is unclear initially but now seems to have an atrial rhythm on amiodarone Although BNP is high she does not seem to have any clinical signs of fluid overload.  Chest x-ray shows chronic interstitial prominence which is probably based on ILD She certainly seems to have had some hypotension as evidenced by high lactate which is decreasing.  There is no evidence of sepsis or source  Shock, favor cardiogenic Possible acute coronary syndrome HFrEF  -Place arterial line to more accurately due to mild blood pressure, based on anatomy, only access may be left femoral or axillary. 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 Delton Coombes in 2022 when she was on 3 L oxygen  -Dial down FiO2, can use Ventimask or high flow nasal cannula -Continue budesonide Rosalyn Gess and Yupelri which is her home regimen  Renal transplant recipient CKD 3B -Continue azathioprine and low-dose prednisone -Will do stress dose steroids -  Goals of care -agrees to DNR as on previous admissions, will need goals of care conversation.  She is agreeable for arterial line placement   Best Practice (right click and "Reselect all SmartList Selections" daily)   Diet/type: clear liquids DVT prophylaxis: DOAC GI prophylaxis: N/A Lines: N/A Foley:  N/A Code Status:  DNR Last date of multidisciplinary goals of care discussion [NA]  Labs   CBC: Recent Labs  Lab 11/24/2022 1252  HGB 11.2*  HCT 33.0*    Basic Metabolic Panel: Recent Labs  Lab 12/08/2022 1230 11/21/2022 1252  NA 133* 132*  K 4.9 5.2*  CL 96*  --   CO2 21*  --   GLUCOSE 340*  --   BUN 22  --   CREATININE 1.51*  --   CALCIUM 9.1  --   MG 1.4*  --    GFR: Estimated Creatinine Clearance: 35.2 mL/min (A) (by C-G formula based on SCr of 1.51 mg/dL (H)). Recent Labs  Lab 12/13/2022 1233 11/21/2022 1433  LATICACIDVEN 4.1* 3.3*    Liver Function Tests: Recent Labs  Lab 11/27/2022 1230  AST 29  ALT 12  ALKPHOS 149*  BILITOT 0.8  PROT 7.0  ALBUMIN 2.8*   Recent Labs  Lab 11/27/2022 1230  LIPASE 18   Recent Labs  Lab 12/05/2022 1233  AMMONIA 38*    ABG    Component Value Date/Time   PHART 7.399 12/08/2021 0025   PCO2ART 41.8 12/08/2021 0025   PO2ART 62 (L) 12/08/2021 0025   HCO3 24.5 11/18/2022 1252   TCO2 26 11/29/2022 1252   ACIDBASEDEF 1.0 11/22/2022 1252   O2SAT 89 11/20/2022 1252     Coagulation Profile: Recent Labs  Lab 12/05/2022 1230  INR 1.6*    Cardiac Enzymes: No results for input(s): "CKTOTAL", "CKMB", "CKMBINDEX", "TROPONINI" in the last 168 hours.  HbA1C: Hgb A1c MFr Bld  Date/Time  Value Ref Range Status  11/15/2022 02:29 AM 8.3 (H) 4.8 - 5.6 % Final    Comment:    (NOTE)         Prediabetes: 5.7 - 6.4         Diabetes: >6.4         Glycemic control for adults with diabetes: <7.0   06/16/2022 03:59 PM 8.5 (H) 4.6 - 6.5 % Final    Comment:    Glycemic Control Guidelines for People with Diabetes:Non Diabetic:  <6%Goal of Therapy: <7%Additional Action Suggested:  >8%     CBG: Recent Labs  Lab 12/11/2022 1241  GLUCAP 305*    Review of Systems:   Limited ROS obtained  Chest pain is resolved Shortness of breath is much improved  Constitutional: negative for anorexia, fevers and sweats  Eyes: negative for irritation, redness and visual disturbance  Ears, nose, mouth, throat, and face: negative for earaches, epistaxis, nasal congestion and sore throat   Gastrointestinal:  negative for abdominal pain, constipation, diarrhea, melena, nausea and vomiting  Genitourinary:negative for dysuria, frequency and hematuria  Hematologic/lymphatic: negative for bleeding, easy bruising and lymphadenopathy  Musculoskeletal:negative for arthralgias, muscle weakness and stiff joints  Neurological: negative for coordination problems, gait problems, headaches and weakness  Endocrine: negative for diabetic symptoms including polydipsia, polyuria and weight loss   Past Medical History:  She,  has a past medical history of Anemia, Anxiety, Bilateral carotid artery stenosis, CAD (coronary artery disease) of bypass graft (5/01; 3/'02, 8/'03, 10/'04; 1/15), CAD in native artery (07/1993), COPD mixed type, Depression with anxiety, Diabetes mellitus type 2 in obese, Diarrhea, Dyslipidemia, goal LDL below 70, ESRD (end stage renal disease) (1991), Family history of adverse reaction to anesthesia, Fibromyalgia, GERD (gastroesophageal reflux disease), H/O ST elevation myocardial infarction (STEMI) of inferoposterior wall (07/1993), H/O: GI bleed, History of kidney stones, History of stroke  (2012), 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   Surgical History:   Past Surgical History:  Procedure Laterality Date   ABDOMINAL AORTOGRAM N/A 04/21/2018   Procedure: ABDOMINAL AORTOGRAM;  Surgeon: Marykay Lex, MD;  [facility] INVASIVE CV LAB;  Service: Cardiovascular;  Laterality: N/A;   CATHETER REMOVAL     CHOLECYSTECTOMY N/A 10/29/2014   Procedure: LAPAROSCOPIC CHOLECYSTECTOMY WITH INTRAOPERATIVE CHOLANGIOGRAM;  Surgeon: Glenna Fellows, MD;  Location: [facility] ORS;  Service: General;  Laterality: N/A;   CORONARY ANGIOPLASTY  1994   x5   CORONARY ARTERY BYPASS GRAFT  1995   LIMA-LAD, SVG-RPDA, SVG-D1   CORONARY PRESSURE/FFR STUDY N/A 02/20/2021   Procedure: INTRAVASCULAR PRESSURE WIRE/FFR STUDY;  Surgeon: Marykay Lex, MD;  Location: [facility] INVASIVE CV LAB;  Service: Cardiovascular;  Laterality: N/A;   CORONARY STENT INTERVENTION N/A 02/20/2021   Procedure: PERCUTANEOUS CORONARY STENT INTERVENTION;  Surgeon: Marykay Lex, MD;  Location: [facility] INVASIVE CV LAB; ostLCx 60% (Neg RFR 0.96);; SVG- D2 recurrent 90% ISR & 95% native D2 after graft-> DES PCI of 95% anastomotic D2 lesion (Onyx Frontier 2.25 mm x 12 mm => 2.75 mm @ overlap, 2.5 distal.);PTCA of ISR in body of graft. ->  2.5 mm scoring balloon & post-dil w/ 2.75 mm Bovina balloon   ESOPHAGOGASTRODUODENOSCOPY N/A 10/15/2016   Procedure: ESOPHAGOGASTRODUODENOSCOPY (EGD);  Surgeon: Charlott Rakes, MD;  Location: [facility] ENDOSCOPY;  Service: Endoscopy;  Laterality: N/A;   I & D EXTREMITY Right 01/29/2018   Procedure: IRRIGATION AND DEBRIDEMENT  THUMB;  Surgeon: Knute Neu, MD;  Location: [facility] OR;  Service: Plastics;  Laterality: Right;   INCISE AND DRAIN ABCESS     KIDNEY TRANSPLANT  1991   KNEE ARTHROSCOPY WITH LATERAL MENISECTOMY Left 12/03/2017   Procedure: LEFT KNEE ARTHROSCOPY WITH LATERAL MENISECTOMY;  Surgeon: Frederico Hamman, MD;  Location: [facility] OR;  Service: Orthopedics;  Laterality: Left;   LAPAROSCOPIC GASTRIC BANDING  04/2004; 10/'09, 2/'10   Port Replacement x 2   LEFT HEART CATH AND CORONARY ANGIOGRAPHY N/A 02/20/2021   Procedure: LEFT HEART CATH AND CORONARY ANGIOGRAPHY;  Surgeon: Marykay Lex, MD;  Location: [facility] INVASIVE CV LAB;  Service: Cardiovascular;  Laterality: N/A;   LEFT HEART CATH AND CORS/GRAFTS ANGIOGRAPHY N/A 04/21/2018   Procedure: LEFT HEART CATH AND CORS/GRAFTS ANGIOGRAPHY;  Surgeon: Marykay Lex, MD;  Location: [facility] INVASIVE CV LAB;  Ost-Prox LAD 50% - proxLAD (pre & post D1) 100% CTO. Cx - patent, small OM1 (stable ~ ostial OM1 90%, too small for PCI) & 2 small LPL; Ost-distal RCA 100% CTO.  LIMA-LAD (not injected); SVG-dRCA patent, SVG-D1 - insertion stent ~20% ISR - Severe R CFA disease w/ focal Sub TO   LEFT HEART CATH AND CORS/GRAFTS ANGIOGRAPHY  5/'01, 3/'02, 8/'03, 10/'04; 1/'15   08/22/2013: LAD & RCA 100%; LIMA-LAD & SVG-rPDA patent; Cx-- OM1 60%, OM2 ostial ~50%; SVG-D1 - 80% mid, 50% distal ISR --PCI   LEFT HEART CATH AND CORS/GRAFTS ANGIOGRAPHY N/A 01/31/2021   Procedure: LEFT HEART CATH AND CORS/GRAFTS ANGIOGRAPHY;  Surgeon: Dolores Patty, MD;  Location: [facility] INVASIVE CV LAB;;   LEFT HEART CATH AND CORS/GRAFTS ANGIOGRAPHY N/A 09/05/2021   Procedure: LEFT HEART CATH AND CORS/GRAFTS ANGIOGRAPHY;  Surgeon: Marykay Lex, MD;  Location: [facility] INVASIVE CV LAB;  Service: Cardiovascular;  Laterality: N/A;   LEFT HEART CATH AND CORS/GRAFTS ANGIOGRAPHY N/A 08/21/2021   Procedure: LEFT HEART CATH AND CORS/GRAFTS ANGIOGRAPHY;  Surgeon: Lyn Records, MD;  Location: [facility] INVASIVE CV LAB;  Service:  Cardiovascular;  Laterality: N/A;   LEFT HEART CATHETERIZATION WITH CORONARY/GRAFT ANGIOGRAM N/A 08/23/2013   Procedure: LEFT HEART CATHETERIZATION WITH Isabel Caprice;  Surgeon: Iran Ouch, MD;  Location: [facility] CATH LAB;  Service: Cardiovascular;  Laterality: N/A;   Lower Extremity Arterial Dopplers  08/2013   ABI: R 0.96, L 1.04   MULTIPLE TOOTH EXTRACTIONS  age 65   NM MYOVIEW LTD  03/2016   EF 62%. LOW RISK. C/W prior MI - no Ischemia. Apical hypokinesis.   PERCUTANEOUS CORONARY STENT INTERVENTION (PCI-S)  5/'01, 3/'02, 8/'03, 10/'04;   '01 - S660 BMS 2.5 x 9 - dSVG-D1 into D1; '02- post-stent stenosis - 2.5 x 8 Pixel BMS; '8\03: ISR/Thrombosis into native D1 - AngioJet, 2.5 x 13 Pixel; '04 - ISR 95% - covered stented area with Taxus DES 2.5 mm x 20 (2.88)   PERCUTANEOUS CORONARY STENT INTERVENTION (PCI-S)  08/23/2013   Procedure: PERCUTANEOUS CORONARY STENT INTERVENTION (PCI-S);  Surgeon: Iran Ouch, MD;  Location: [facility] CATH LAB;  Service: Cardiovascular;;mid SVG-D1 80%; distal stent ~50% ISR; Promus Prermier DES 2.75 mm xc 20 mm (2.8 mm)   PORT-A-CATH REMOVAL     kidney   TRANSTHORACIC ECHOCARDIOGRAM  07/2019   a) 07/2019: EF 55 to 60%.  No LVH.  Paradoxical septal WM-s/p CABG.  GRII DD.  Nl RV size and fxn.  Mild bilateral atrial dilation.  Mod MAC.  Trace MR.  Mild AS (gradients: Mean 14.3 mmHg -peak 24.9 mmHg).; B) 06/2020: EF 40 to 45%.  Moderate concentric LVH.  GRII DD.  Elevated LAP.  Mod HK mid Apical Ant-AntSept wall & mild Apical Dyskinesis.  Mod LA dilation.  Mild MR.  AoV sclerosis w/o AS.   TRANSTHORACIC ECHOCARDIOGRAM  01/30/2021   EF 55 to 60%.  Mild LVH.  GR 1 DD.  Elevated LAP.  Moderate LA dilation.  Mild MR with mild MS.  Mild aortic valve stenosis.:   TUBAL LIGATION     wrist fistula repair Left    dialysis for one year     Social History:   reports that she quit smoking about 20 years ago. Her smoking use included cigarettes. She has a 30.00  pack-year smoking history. She has never used smokeless tobacco. She reports that she does not drink alcohol and does not use drugs.  Family History:  Her family history includes Arrhythmia in her brother and paternal aunt; Cancer in her father and mother; Heart disease in her father.   Allergies Allergies  Allergen Reactions   Tetracycline Hives    Patient tolerated Doxycycline Dec 2020   Niacin Other (See Comments)    Mouth blisters   Niaspan [Niacin Er] Other (See Comments)    Mouth blisters   Sulfa Antibiotics Nausea Only and Other (See Comments)    "Tears up stomach"   Sulfonamide Derivatives Other (See Comments)    Reaction: per patient "tears her stomach up"   Codeine Nausea And Vomiting   Erythromycin Nausea And Vomiting   Hydromorphone Hcl Nausea And Vomiting   Morphine And Related Nausea And Vomiting   Nalbuphine Nausea And Vomiting    Nubain   Sulfasalazine Nausea Only and Other (See Comments)    per patient "tears her stomach up", "Tears up stomach"   Tape Rash and Other (See Comments)    No "plastic" tape," please----cloth tape only     Home Medications  Prior to Admission medications   Medication Sig Start Date End Date Taking? Authorizing Provider  albuterol (VENTOLIN HFA) 108 (90 Base) MCG/ACT inhaler TAKE 2 PUFFS BY MOUTH EVERY 6 HOURS AS NEEDED FOR WHEEZE OR SHORTNESS OF BREATH Patient taking differently: Inhale 2 puffs into the lungs every 6 (six) hours as needed for shortness of breath. 07/30/22   Leslye Peer, MD  arformoterol (BROVANA) 15 MCG/2ML NEBU Take 2 mLs (15 mcg total) by nebulization 2 (two) times daily. 08/30/19   Leslye Peer, MD  azaTHIOprine (IMURAN) 50 MG tablet Take 2 & 1/2 tablets (125 mg) by mouth daily at 3pm Patient taking differently: Take 125 mg by mouth daily. 10/21/21   Pokhrel, Rebekah Chesterfield, MD  bisacodyl (DULCOLAX) 5 MG EC tablet Take 1 tablet (5 mg total) by mouth daily as needed for moderate constipation. 10/05/21   Drema Dallas,  MD  budesonide (PULMICORT) 0.5 MG/2ML nebulizer solution Take 2 mLs (0.5 mg total) by nebulization 2 (two) times daily. Patient taking differently: Take 2 mLs by nebulization 2 (two) times daily as needed (wheezing). 08/24/19   [name], MD  calcitRIOL (ROCALTROL) 0.25 MCG capsule Take 0.25 mcg by mouth See admin instructions. Takes every 3 days.    [provider]  clopidogrel (PLAVIX) 75 MG tablet Take 1 tablet (75 mg total) by mouth daily. 11/11/22 01/10/23  Noralee Stain, DO  Continuous Blood Gluc Receiver (FREESTYLE LIBRE 2 READER) DEVI 1 Act by Does not apply route daily. 06/17/22   [name], MD  Continuous Blood Gluc Sensor (FREESTYLE LIBRE 2 SENSOR) MISC 1 Act by Does not apply route daily. 06/17/22   [name], MD  Continuous Blood Gluc Sensor (FREESTYLE LIBRE 3 SENSOR) MISC 1 Units by Does not apply route 3 (three) times daily. Place 1 sensor on the skin every 14 days. Use to check glucose continuously 11/26/22   Hughie Closs, MD  CVS VITAMIN C 500 MG tablet TAKE 1 TABLET (500 MG TOTAL) BY MOUTH DAILY. Patient taking differently: Take 500 mg by mouth daily. 02/03/22   [name], MD  cyclobenzaprine (FLEXERIL) 10 MG tablet TAKE 1 TABLET BY MOUTH TWICE A DAY AS NEEDED FOR MUSCLE SPASM 12/03/22   [name], MD  ELIQUIS 5 MG TABS tablet TAKE 1 TABLET BY MOUTH TWICE A DAY 10/22/22   [name], MD  empagliflozin (JARDIANCE) 10 MG TABS tablet Take  1 tablet (10 mg total) by mouth daily. 11/11/22 01/10/23  Noralee Stain, DO  Ensure Max Protein (ENSURE MAX PROTEIN) LIQD Take 330 mLs (11 oz total) by mouth 2 (two) times daily. 12/12/21   Narda Bonds, MD  fluticasone (FLONASE) 50 MCG/ACT nasal spray PLACE 2 SPRAYS INTO BOTH NOSTRILS 2 TIMES DAILY. Patient taking differently: Place 2 sprays into both nostrils 2 (two) times daily. 08/04/22   [name], MD  furosemide (LASIX) 40 MG tablet Take 1 tablet (40 mg total) by mouth 2 (two) times daily. 11/26/22    Hughie Closs, MD  gabapentin (NEURONTIN) 100 MG capsule Take 1 capsule (100 mg total) by mouth at bedtime. Patient taking differently: Take 100 mg by mouth daily as needed (For pain). 09/11/21   Kathlen Mody, MD  glucose blood (GLUCOCOM TEST) test strip Use as instructed 11/26/22   Hughie Closs, MD  guaiFENesin-dextromethorphan (ROBITUSSIN DM) 100-10 MG/5ML syrup Take 15 mLs by mouth every 4 (four) hours as needed for cough. 11/23/22   Kathlen Mody, MD  hydrocortisone (ANUSOL-HC) 25 MG suppository Place 1 suppository (25 mg total) rectally 2 (two) times daily. 11/23/22   Kathlen Mody, MD  insulin glargine-yfgn (SEMGLEE) 100 UNIT/ML injection Inject 0.08 mLs (8 Units total) into the skin daily. 11/24/22   Kathlen Mody, MD  Insulin Pen Needle (PEN NEEDLES) 32G X 4 MM MISC Use as needed to administer insulin. E11.9 02/01/22   [name], MD  isosorbide mononitrate (IMDUR) 30 MG 24 hr tablet Take 1 tablet (30 mg total) by mouth daily. 11/12/22   Almon Hercules, MD  KLOR-CON M10 10 MEQ tablet TAKE 1 TABLET BY MOUTH 2 TIMES DAILY. 09/19/22   [name], MD  lamoTRIgine (LAMICTAL) 25 MG tablet TAKE 1 TABLET BY MOUTH EVERYDAY AT BEDTIME Patient taking differently: Take 25 mg by mouth daily. 02/19/22   Ranelle Oyster, MD  Lancets (FREESTYLE) lancets Use as directed up to four times daily 11/26/22   Hughie Closs, MD  metFORMIN (GLUCOPHAGE) 500 MG tablet Take 500 mg by mouth 2 (two) times daily. 12/08/21   [provider]  metoprolol tartrate (LOPRESSOR) 50 MG tablet Take 1 tablet (50 mg total) by mouth 2 (two) times daily. 11/10/22 01/09/23  Noralee Stain, DO  montelukast (SINGULAIR) 10 MG tablet TAKE 1 TABLET BY MOUTH EVERYDAY AT BEDTIME Patient taking differently: Take 10 mg by mouth at bedtime. 05/04/22   Leslye Peer, MD  Multiple Vitamin (MULTIVITAMIN WITH MINERALS) TABS tablet Take 1 tablet by mouth daily. 12/13/21   Narda Bonds, MD  nitroGLYCERIN (NITROSTAT) 0.4 MG SL tablet PLACE 1  TAB UNDER TONGUE EVERY FOR CHEST PAIN Patient taking differently: Place 0.4 mg under the tongue every 5 (five) minutes as needed for chest pain. 06/30/22   Marykay Lex, MD  oxyCODONE (OXY IR/ROXICODONE) 5 MG immediate release tablet Take 1 tablet (5 mg total) by mouth every 6 (six) hours as needed for severe pain. 12/02/22   [name], MD  pantoprazole (PROTONIX) 40 MG tablet TAKE 1 TABLET BY MOUTH EVERY DAY 08/05/22   [name], MD  predniSONE (DELTASONE) 5 MG tablet Take 5 mg by mouth daily with breakfast.    [provider]  promethazine (PHENERGAN) 25 MG tablet Take 1 tablet (25 mg total) by mouth every 6 (six) hours as needed for nausea or vomiting. 05/20/22   Loyola Mast, MD  ranolazine (RANEXA) 1000 MG SR tablet TAKE 1 TABLET BY  MOUTH TWICE A DAY 10/31/22   [name], MD  rosuvastatin (CRESTOR) 20 MG tablet TAKE 1 TABLET BY MOUTH EVERY DAY 07/29/22   Marykay Lex, MD  sertraline (ZOLOFT) 100 MG tablet Take 2 tablets (200 mg total) by mouth daily. 06/16/22   [name], MD  witch hazel-glycerin (TUCKS) pad Apply topically as needed for itching. 11/23/22   Kathlen Mody, MD     Critical care time: 82 m     Cyril Mourning MD. [facility]. [place] [facility] Pager : [phone]  If no response to pager , please call [phone] until 7 pm After 7:00 pm call Elink  [phone]   11/20/2022

## 2022-12-06 NOTE — Progress Notes (Signed)
Attempted Lt femoral art line . Unable to thread . Pt c/o pain. Femoral a not well seen in RT groin. May need axillary but given DNR status , prefer not to attempt more painful procedures.  Comer Locket Vassie Loll MD

## 2022-12-06 NOTE — Procedures (Signed)
Arterial Catheter Insertion Procedure Note  Katherine [name]  [id number]  1955-12-30  Date:12/06/22  Time:10:07 PM    Provider Performing: Lidia Collum    Procedure: Insertion of Arterial Line (40981) with US guidance (19147)   Indication(s) Blood pressure monitoring and/or need for frequent ABGs  Consent Risks of the procedure as well as the alternatives and risks of each were explained to the patient and/or caregiver.  Consent for the procedure was obtained and is signed in the bedside chart  Anesthesia None   Time Out Verified patient identification, verified procedure, site/side was marked, verified correct patient position, special equipment/implants available, medications/allergies/relevant history reviewed, required imaging and test results available.   Sterile Technique Maximal sterile technique including full sterile barrier drape, hand hygiene, sterile gown, sterile gloves, mask, hair covering, sterile ultrasound probe cover (if used).   Procedure Description Area of catheter insertion was cleaned with chlorhexidine and draped in sterile fashion. With real-time ultrasound guidance an arterial catheter was placed into the right  axillary  artery.  Appropriate arterial tracings confirmed on monitor.     Complications/Tolerance None; patient tolerated the procedure well.   EBL Minimal   Specimen(s) None  JD Anselm Lis [place] [facility] 12/06/2022, 10:08 PM  Please see [email or web address] for pager details.  From 7A-7P if no response, please call [phone]. After hours, please call ELink [phone].

## 2022-12-06 NOTE — Progress Notes (Signed)
Responded to Code Stemi after it was cancelled to offer support to pt. Pt was alert and willing to visit. I provided prayer, a calm presence and reflective listening.  Chaplain [place], [place]   12/06/22 1245  Spiritual Encounters  Type of Visit Initial  Care provided to: Patient  Conversation partners present during encounter Nurse  Referral source Code page  Reason for visit Code  Spiritual Framework  Presenting Themes Impactful experiences and emotions  Interventions  Spiritual Care Interventions Made Established relationship of care and support;Compassionate presence;Prayer  Intervention Outcomes  Outcomes Connection to spiritual care;Awareness around self/spiritual resourses;Reduced anxiety

## 2022-12-06 NOTE — H&P (Signed)
NAME:  Katherine [name], MRN:  [id number], DOB:  03/23/1956, LOS: 0 ADMISSION DATE:  12/03/2022, CONSULTATION DATE:  12/11/2022  REFERRING MD:  Maebelle Munroe, CHIEF COMPLAINT:  chest pain   History of Present Illness:  67 year old woman with chronic respiratory failure on 7 L oxygen at home, HFrEF, known CAD and severe PAD brought in by EMS today for chest discomfort and generalized weakness.  She denies fevers or cough. EMS found her to have heart rate of 140, oxygen saturation 95%, CBG 315, brought in as code STEMI.  Initial EKG showed left bundle branch block, unable to discern whether this was atrial rhythm.  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 COPD/ILD -followed by Dr. Delton Coombes HFrEF, EF 35 to 40% on last echo Anterior MI - 1994 s/p PTCA, CABGx3 1995 Persistent AF - on eliquis, tikosyn Renal transplantation 1991, now CKD 3b Gastric bypass   Significant Hospital Events: Including procedures, antibiotic start and stop dates in addition to other pertinent events     Interim History / Subjective:  Feels much improved Able to speak in full sentences Chest pain is resolved  Objective   Blood pressure (!) 72/49, pulse 97, temperature 97.6 F (36.4 C), temperature source Oral, resp. rate (!) 25, SpO2 95 %.        Intake/Output Summary (Last 24 hours) at 12/11/2022 1620 Last data filed at 12/11/2022  1430 Gross per 24 hour  Intake 100 ml  Output --  Net 100 ml    There were no vitals filed for this visit.  Examination: General: Elderly woman, ED stretcher, on nonrebreather, thirsty HENT: No JVD, mild pallor, no icterus Lungs: No rhonchi, no accessory muscle use, dry crackles left base Cardiovascular: S1-S2 irregular, distant, no murmur Abdomen: Soft, nontender, no hepatosplenomegaly Extremities: Left AV graft no thrill, right radial pulse not palpable, cool feet, unable to palpate pulses Neuro: Alert, interactive, nonfocal  EKG shows left bundle branch pattern, unclear rhythm   Resolved Hospital Problem list     Assessment & Plan:  Her assessment is difficult since no acute Blood pressure is available.  She had a heart rate blood pressure in the 60s but now is reading 160s on cuff blood pressure.  Her chest pain is now resolved she could possibly have had acute chest syndrome but troponins are flat.  EKG shows baseline left bundle branch block, rhythm is unclear initially but now seems to have an atrial rhythm on amiodarone Although BNP is high she does not seem to have any clinical signs of fluid overload.  Chest x-ray shows chronic interstitial prominence which is probably based on ILD She certainly seems to have had some hypotension as evidenced by high lactate which is decreasing.  There is no evidence of sepsis or source  Shock, favor cardiogenic Possible acute coronary syndrome HFrEF  -Place arterial line to more accurately due to mild blood pressure, based on anatomy, only access may be left femoral or axillary. 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 Delton Coombes in 2022 when she was on 3 L oxygen  -Dial down FiO2, can use Ventimask or high flow nasal cannula -Continue budesonide Rosalyn Gess and Yupelri which is her home regimen  Renal transplant recipient CKD 3B -Continue azathioprine and low-dose prednisone -Will do stress dose steroids -  Goals of care -agrees to DNR as on previous admissions, will need goals of care conversation.  She is agreeable for arterial line placement   Best Practice (right click and "Reselect all SmartList Selections" daily)   Diet/type: clear liquids DVT prophylaxis: DOAC GI prophylaxis: N/A Lines: N/A Foley:  N/A Code Status:  DNR Last date of multidisciplinary goals of care discussion [NA]  Labs   CBC: Recent Labs  Lab 12/05/2022 1252  HGB 11.2*  HCT 33.0*     Basic Metabolic Panel: Recent Labs  Lab 11/19/2022 1230 12/05/2022 1252  NA 133* 132*  K 4.9 5.2*  CL 96*  --   CO2 21*  --   GLUCOSE 340*  --   BUN 22  --   CREATININE 1.51*  --   CALCIUM 9.1  --   MG 1.4*  --     GFR: Estimated Creatinine Clearance: 35.2 mL/min (A) (by C-G formula based on SCr of 1.51 mg/dL (H)). Recent Labs  Lab 12/11/2022 1233 11/19/2022 1433  LATICACIDVEN 4.1* 3.3*     Liver Function Tests: Recent Labs  Lab 12/09/2022 1230  AST 29  ALT 12  ALKPHOS 149*  BILITOT 0.8  PROT 7.0  ALBUMIN 2.8*    Recent Labs  Lab 11/21/2022 1230  LIPASE 18    Recent Labs  Lab 11/21/2022 1233  AMMONIA 38*     ABG    Component Value Date/Time   PHART 7.399 12/08/2021 0025   PCO2ART 41.8 12/08/2021 0025   PO2ART 62 (L) 12/08/2021 0025   HCO3 24.5 12/04/2022 1252   TCO2 26 11/16/2022 1252   ACIDBASEDEF 1.0 11/29/2022 1252   O2SAT 89 12/01/2022 1252     Coagulation Profile: Recent Labs  Lab 12/03/2022 1230  INR 1.6*     Cardiac Enzymes: No results for input(s): "CKTOTAL", "CKMB", "CKMBINDEX", "TROPONINI" in the last 168 hours.  HbA1C: Hgb A1c MFr Bld   Date/Time Value Ref Range Status  11/15/2022 02:29 AM 8.3 (H) 4.8 - 5.6 % Final    Comment:    (NOTE)         Prediabetes: 5.7 - 6.4         Diabetes: >6.4         Glycemic control for adults with diabetes: <7.0   06/16/2022 03:59 PM 8.5 (H) 4.6 - 6.5 % Final    Comment:    Glycemic Control Guidelines for People with Diabetes:Non Diabetic:  <6%Goal of Therapy: <7%Additional Action Suggested:  >8%     CBG: Recent Labs  Lab 12/05/2022 1241  GLUCAP 305*     Review of Systems:   Limited ROS obtained  Chest pain is resolved Shortness of breath is much improved  Constitutional: negative for anorexia, fevers and sweats  Eyes: negative for irritation, redness and visual disturbance  Ears, nose, mouth, throat, and face: negative for  earaches, epistaxis, nasal congestion and sore throat   Gastrointestinal: negative for abdominal pain, constipation, diarrhea, melena, nausea and vomiting  Genitourinary:negative for dysuria, frequency and hematuria  Hematologic/lymphatic: negative for bleeding, easy bruising and lymphadenopathy  Musculoskeletal:negative for arthralgias, muscle weakness and stiff joints  Neurological: negative for coordination problems, gait problems, headaches and weakness  Endocrine: negative for diabetic symptoms including polydipsia, polyuria and weight loss   Past Medical History:  She,  has a past medical history of Anemia, Anxiety, Bilateral carotid artery stenosis, CAD (coronary artery disease) of bypass graft (5/01; 3/'02, 8/'03, 10/'04; 1/15), CAD in native artery (07/1993), COPD mixed type, Depression with anxiety, Diabetes mellitus type 2 in obese, Diarrhea, Dyslipidemia, goal LDL below 70, ESRD (end stage renal disease) (1991), Family history of adverse reaction to anesthesia, Fibromyalgia, GERD (gastroesophageal reflux disease), H/O ST elevation myocardial infarction (STEMI) of inferoposterior wall (07/1993), H/O: GI bleed, History of kidney stones, History of  stroke (2012), 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   Surgical History:   Past Surgical History:  Procedure Laterality Date   ABDOMINAL AORTOGRAM N/A 04/21/2018   Procedure: ABDOMINAL AORTOGRAM;  Surgeon: Marykay Lex, MD;  Location: [facility] INVASIVE CV LAB;  Service: Cardiovascular;  Laterality: N/A;   CATHETER REMOVAL     CHOLECYSTECTOMY N/A 10/29/2014   Procedure: LAPAROSCOPIC CHOLECYSTECTOMY WITH INTRAOPERATIVE CHOLANGIOGRAM;  Surgeon: Glenna Fellows, MD;  Location: [facility] ORS;  Service: General;  Laterality: N/A;   CORONARY ANGIOPLASTY  1994   x5   CORONARY ARTERY BYPASS GRAFT  1995   LIMA-LAD, SVG-RPDA, SVG-D1   CORONARY PRESSURE/FFR STUDY N/A 02/20/2021   Procedure: INTRAVASCULAR PRESSURE WIRE/FFR STUDY;  Surgeon: Marykay Lex, MD;  Location: [facility] INVASIVE CV LAB;  Service: Cardiovascular;  Laterality: N/A;   CORONARY STENT INTERVENTION N/A 02/20/2021   Procedure: PERCUTANEOUS CORONARY STENT INTERVENTION;  Surgeon: Marykay Lex, MD;  Location: [facility] INVASIVE CV LAB; ostLCx 60% (Neg RFR 0.96);; SVG- D2 recurrent 90% ISR & 95% native D2 after graft-> DES PCI of 95% anastomotic D2 lesion (Onyx Frontier 2.25 mm x 12 mm => 2.75 mm @ overlap, 2.5 distal.);PTCA of ISR in body of graft. ->  2.5 mm scoring balloon & post-dil w/ 2.75 mm [place] balloon   ESOPHAGOGASTRODUODENOSCOPY N/A 10/15/2016   Procedure: ESOPHAGOGASTRODUODENOSCOPY (EGD);  Surgeon: Charlott Rakes, MD;  Location: [facility] ENDOSCOPY;  Service: Endoscopy;  Laterality: N/A;   I & D EXTREMITY Right 01/29/2018   Procedure: IRRIGATION AND  DEBRIDEMENT THUMB;  Surgeon: Knute Neu, MD;  Location: [facility] OR;  Service: Plastics;  Laterality: Right;   INCISE AND DRAIN ABCESS     KIDNEY TRANSPLANT  1991   KNEE ARTHROSCOPY WITH LATERAL MENISECTOMY Left 12/03/2017   Procedure: LEFT KNEE ARTHROSCOPY WITH LATERAL MENISECTOMY;  Surgeon: Frederico Hamman, MD;  Location: [facility] OR;  Service: Orthopedics;  Laterality: Left;   LAPAROSCOPIC GASTRIC BANDING  04/2004; 10/'09, 2/'10   Port Replacement x 2   LEFT HEART CATH AND CORONARY ANGIOGRAPHY N/A 02/20/2021   Procedure: LEFT HEART CATH AND CORONARY ANGIOGRAPHY;  Surgeon: Marykay Lex, MD;  Location: [facility] INVASIVE CV LAB;  Service: Cardiovascular;  Laterality: N/A;   LEFT HEART CATH AND CORS/GRAFTS ANGIOGRAPHY N/A 04/21/2018   Procedure: LEFT HEART CATH  AND CORS/GRAFTS ANGIOGRAPHY;  Surgeon: Marykay Lex, MD;  Location: [facility] INVASIVE CV LAB;  Ost-Prox LAD 50% - proxLAD (pre & post D1) 100% CTO. Cx - patent, small OM1 (stable ~ ostial OM1 90%, too small for PCI) & 2 small LPL; Ost-distal RCA 100% CTO.  LIMA-LAD (not injected); SVG-dRCA patent, SVG-D1 - insertion stent ~20% ISR - Severe R CFA disease w/ focal Sub TO   LEFT HEART CATH AND CORS/GRAFTS ANGIOGRAPHY  5/'01, 3/'02, 8/'03, 10/'04; 1/'15   08/22/2013: LAD & RCA 100%; LIMA-LAD & SVG-rPDA patent; Cx-- OM1 60%, OM2 ostial ~50%; SVG-D1 - 80% mid, 50% distal ISR --PCI   LEFT HEART CATH AND CORS/GRAFTS ANGIOGRAPHY N/A 01/31/2021   Procedure: LEFT HEART CATH AND CORS/GRAFTS ANGIOGRAPHY;  Surgeon: Dolores Patty, MD;  Location: [facility] INVASIVE CV LAB;;   LEFT HEART CATH AND CORS/GRAFTS ANGIOGRAPHY N/A 09/05/2021   Procedure: LEFT HEART CATH AND CORS/GRAFTS ANGIOGRAPHY;  Surgeon: Marykay Lex, MD;  Location: [facility] INVASIVE CV LAB;  Service: Cardiovascular;  Laterality: N/A;   LEFT HEART CATH AND CORS/GRAFTS ANGIOGRAPHY N/A 08/21/2021   Procedure: LEFT HEART CATH AND CORS/GRAFTS ANGIOGRAPHY;  Surgeon: Lyn Records, MD;  Location: [facility] INVASIVE CV LAB;   Service: Cardiovascular;  Laterality: N/A;   LEFT HEART CATHETERIZATION WITH CORONARY/GRAFT ANGIOGRAM N/A 08/23/2013   Procedure: LEFT HEART CATHETERIZATION WITH Isabel Caprice;  Surgeon: Iran Ouch, MD;  Location: [facility] CATH LAB;  Service: Cardiovascular;  Laterality: N/A;   Lower Extremity Arterial Dopplers  08/2013   ABI: R 0.96, L 1.04   MULTIPLE TOOTH EXTRACTIONS  age 33   NM MYOVIEW LTD  03/2016   EF 62%. LOW RISK. C/W prior MI - no Ischemia. Apical hypokinesis.   PERCUTANEOUS CORONARY STENT INTERVENTION (PCI-S)  5/'01, 3/'02, 8/'03, 10/'04;   '01 - S660 BMS 2.5 x 9 - dSVG-D1 into D1; '02- post-stent stenosis - 2.5 x 8 Pixel BMS; '8\03: ISR/Thrombosis into native D1 - AngioJet, 2.5 x 13 Pixel; '04 - ISR 95% - covered stented area with Taxus DES 2.5 mm x 20 (2.88)   PERCUTANEOUS CORONARY STENT INTERVENTION (PCI-S)  08/23/2013   Procedure: PERCUTANEOUS CORONARY STENT INTERVENTION (PCI-S);  Surgeon: Iran Ouch, MD;  Location: [facility] CATH LAB;  Service: Cardiovascular;;mid SVG-D1 80%; distal stent ~50% ISR; Promus [place]rm[place] DES 2.75 mm xc 20 mm (2.8 mm)   PORT-A-CATH REMOVAL     kidney   TRANSTHORACIC ECHOCARDIOGRAM  07/2019   a) 07/2019: EF 55 to 60%.  No LVH.  Paradoxical septal WM-s/p CABG.  GRII DD.  Nl RV size and fxn.  Mild bilateral atrial dilation.  Mod MAC.  Trace MR.  Mild AS (gradients: Mean 14.3 mmHg -peak 24.9 mmHg).; B) 06/2020: EF 40 to 45%.  Moderate concentric LVH.  GRII DD.  Elevated LAP.  Mod HK mid Apical Ant-AntSept wall & mild Apical Dyskinesis.  Mod LA dilation.  Mild MR.  AoV sclerosis w/o AS.   TRANSTHORACIC ECHOCARDIOGRAM  01/30/2021   EF 55 to 60%.  Mild LVH.  GR 1 DD.  Elevated LAP.  Moderate LA dilation.  Mild MR with mild MS.  Mild aortic valve stenosis.:   TUBAL LIGATION     wrist fistula repair Left    dialysis for one year     Social History:   reports that she quit smoking about 20 years ago. Her smoking use included cigarettes. She has a  30.00 pack-year smoking history. She has never used smokeless tobacco. She reports that she does  not drink alcohol and does not use drugs.   Family History:  Her family history includes Arrhythmia in her brother and paternal aunt; Cancer in her father and mother; Heart disease in her father.   Allergies Allergies  Allergen Reactions   Tetracycline Hives    Patient tolerated Doxycycline Dec 2020   Niacin Other (See Comments)    Mouth blisters   Niaspan [Niacin Er] Other (See Comments)    Mouth blisters   Sulfa Antibiotics Nausea Only and Other (See Comments)    "Tears up stomach"   Sulfonamide Derivatives Other (See Comments)    Reaction: per patient "tears her stomach up"   Codeine Nausea And Vomiting   Erythromycin Nausea And Vomiting   Hydromorphone Hcl Nausea And Vomiting   Morphine And Related Nausea And Vomiting   Nalbuphine Nausea And Vomiting    Nubain   Sulfasalazine Nausea Only and Other (See Comments)    per patient "tears her stomach up", "Tears up stomach"   Tape Rash and Other (See Comments)    No "plastic" tape," please----cloth tape only     Home Medications  Prior to Admission medications   Medication Sig Start Date End Date Taking? Authorizing Provider  albuterol (VENTOLIN HFA) 108 (90 Base) MCG/ACT inhaler TAKE 2 PUFFS BY MOUTH EVERY 6 HOURS AS NEEDED FOR WHEEZE OR SHORTNESS OF BREATH Patient taking differently: Inhale 2 puffs into the lungs every 6 (six) hours as needed for shortness of breath. 07/30/22   Leslye Peer, MD  arformoterol (BROVANA) 15 MCG/2ML NEBU Take 2 mLs (15 mcg total) by nebulization 2 (two) times daily. 08/30/19   Leslye Peer, MD  azaTHIOprine (IMURAN) 50 MG tablet Take 2 & 1/2 tablets (125 mg) by mouth daily at 3pm Patient taking differently: Take 125 mg by mouth daily. 10/21/21   Pokhrel, Rebekah Chesterfield, MD  bisacodyl (DULCOLAX) 5 MG EC tablet Take 1 tablet (5 mg total) by mouth daily as needed for moderate constipation. 10/05/21   Drema Dallas, MD  budesonide (PULMICORT) 0.5 MG/2ML nebulizer solution Take 2 mLs (0.5 mg total) by nebulization 2 (two) times daily. Patient taking differently: Take 2 mLs by nebulization 2 (two) times daily as needed (wheezing). 08/24/19   [name], MD  calcitRIOL (ROCALTROL) 0.25 MCG capsule Take 0.25 mcg by mouth See admin instructions. Takes every 3 days.    [provider]  clopidogrel (PLAVIX) 75 MG tablet Take 1 tablet (75 mg total) by mouth daily. 11/11/22 01/10/23  Noralee Stain, DO  Continuous Blood Gluc Receiver (FREESTYLE LIBRE 2 READER) DEVI 1 Act by Does not apply route daily. 06/17/22   [name], MD  Continuous Blood Gluc Sensor (FREESTYLE LIBRE 2 SENSOR) MISC 1 Act by Does not apply route daily. 06/17/22   [name], MD  Continuous Blood Gluc Sensor (FREESTYLE LIBRE 3 SENSOR) MISC 1 Units by Does not apply route 3 (three) times daily. Place 1 sensor on the skin every 14 days. Use to check glucose continuously 11/26/22   Hughie Closs, MD  CVS VITAMIN C 500 MG tablet TAKE 1 TABLET (500 MG TOTAL) BY MOUTH DAILY. Patient taking differently: Take 500 mg by mouth daily. 02/03/22   [name], MD  cyclobenzaprine (FLEXERIL) 10 MG tablet TAKE 1 TABLET BY MOUTH TWICE A DAY AS NEEDED FOR MUSCLE SPASM 12/03/22   [name], MD  ELIQUIS 5 MG TABS tablet TAKE 1 TABLET BY MOUTH TWICE A DAY 10/22/22   Sanda Linger  L, MD  empagliflozin (JARDIANCE) 10 MG TABS tablet Take 1 tablet (10 mg total) by mouth daily. 11/11/22 01/10/23  Noralee Stain, DO  Ensure Max Protein (ENSURE MAX PROTEIN) LIQD Take 330 mLs (11 oz total) by mouth 2 (two) times daily. 12/12/21   Narda Bonds, MD  fluticasone (FLONASE) 50 MCG/ACT nasal spray PLACE 2 SPRAYS INTO BOTH NOSTRILS 2 TIMES DAILY. Patient taking differently: Place 2 sprays into both nostrils 2 (two) times daily. 08/04/22   [name], MD  furosemide (LASIX) 40 MG tablet Take 1 tablet (40 mg total) by mouth 2 (two) times daily.  11/26/22   Hughie Closs, MD  gabapentin (NEURONTIN) 100 MG capsule Take 1 capsule (100 mg total) by mouth at bedtime. Patient taking differently: Take 100 mg by mouth daily as needed (For pain). 09/11/21   Kathlen Mody, MD  glucose blood (GLUCOCOM TEST) test strip Use as instructed 11/26/22   Hughie Closs, MD  guaiFENesin-dextromethorphan (ROBITUSSIN DM) 100-10 MG/5ML syrup Take 15 mLs by mouth every 4 (four) hours as needed for cough. 11/23/22   Kathlen Mody, MD  hydrocortisone (ANUSOL-HC) 25 MG suppository Place 1 suppository (25 mg total) rectally 2 (two) times daily. 11/23/22   Kathlen Mody, MD  insulin glargine-yfgn (SEMGLEE) 100 UNIT/ML injection Inject 0.08 mLs (8 Units total) into the skin daily. 11/24/22   Kathlen Mody, MD  Insulin Pen Needle (PEN NEEDLES) 32G X 4 MM MISC Use as needed to administer insulin. E11.9 02/01/22   [name], MD  isosorbide mononitrate (IMDUR) 30 MG 24 hr tablet Take 1 tablet (30 mg total) by mouth daily. 11/12/22   Almon Hercules, MD  KLOR-CON M10 10 MEQ tablet TAKE 1 TABLET BY MOUTH 2 TIMES DAILY. 09/19/22   [name], MD  lamoTRIgine (LAMICTAL) 25 MG tablet TAKE 1 TABLET BY MOUTH EVERYDAY AT BEDTIME Patient taking differently: Take 25 mg by mouth daily. 02/19/22   Ranelle Oyster, MD  Lancets (FREESTYLE) lancets Use as directed up to four times daily 11/26/22   Hughie Closs, MD  metFORMIN (GLUCOPHAGE) 500 MG tablet Take 500 mg by mouth 2 (two) times daily. 12/08/21   [provider]  metoprolol tartrate (LOPRESSOR) 50 MG tablet Take 1 tablet (50 mg total) by mouth 2 (two) times daily. 11/10/22 01/09/23  Noralee Stain, DO  montelukast (SINGULAIR) 10 MG tablet TAKE 1 TABLET BY MOUTH EVERYDAY AT BEDTIME Patient taking differently: Take 10 mg by mouth at bedtime. 05/04/22   Leslye Peer, MD  Multiple Vitamin (MULTIVITAMIN WITH MINERALS) TABS tablet Take 1 tablet by mouth daily. 12/13/21   Narda Bonds, MD  nitroGLYCERIN (NITROSTAT) 0.4 MG SL tablet  PLACE 1 TAB UNDER TONGUE EVERY FOR CHEST PAIN Patient taking differently: Place 0.4 mg under the tongue every 5 (five) minutes as needed for chest pain. 06/30/22   Marykay Lex, MD  oxyCODONE (OXY IR/ROXICODONE) 5 MG immediate release tablet Take 1 tablet (5 mg total) by mouth every 6 (six) hours as needed for severe pain. 12/02/22   [name], MD  pantoprazole (PROTONIX) 40 MG tablet TAKE 1 TABLET BY MOUTH EVERY DAY 08/05/22   [name], MD  predniSONE (DELTASONE) 5 MG tablet Take 5 mg by mouth daily with breakfast.    [provider]  promethazine (PHENERGAN) 25 MG tablet Take 1 tablet (25 mg total) by mouth every 6 (six) hours as needed for nausea or vomiting. 05/20/22   Loyola Mast, MD  ranolazine (RANEXA) 1000 MG SR tablet TAKE 1 TABLET BY MOUTH TWICE A DAY 10/31/22   [name], MD  rosuvastatin (CRESTOR) 20 MG tablet TAKE 1 TABLET BY MOUTH EVERY DAY 07/29/22   Marykay Lex, MD  sertraline (ZOLOFT) 100 MG tablet Take 2 tablets (200 mg total) by mouth daily. 06/16/22   [name], MD  witch hazel-glycerin (TUCKS) pad Apply topically as needed for itching. 11/23/22   Kathlen Mody, MD     Critical care time: 53 m     Cyril Mourning MD. [facility]. [facility] Pager : [phone]  If no response to pager , please call [phone] until 7 pm After 7:00 pm call Elink  [phone]   12/10/2022

## 2022-12-06 NOTE — ED Provider Notes (Signed)
[place] EMERGENCY DEPARTMENT AT [facility] Provider Note  CSN: [id number] Arrival date & time: 12/09/2022 1211  Chief Complaint(s) Tachycardia (Chest Pain)  HPI Katherine [name] is a 67 y.o. female with past medical history as below, significant for CAD, COPD, DM, HLD, PAF, PAD, obesity, home o2 dependence (6-7L) who presents to the ED with complaint of chest pain/dib. Pt was encoded as STEMI, reviewed EKG on arrival and cancelled, appears to be LBBB which is known problem for her. D/w Dr Excell Seltzer cardiology at bedside, pt well known to him.   Patient with difficulty describing her symptoms, reports vague chest pain, says her mouth is dry and she feels unwell.  Reports compliance with her medications but when EMS arrived to her home her medications loosely dispersed in a plastic container.  Patient reports around 10 AM she began having chest discomfort difficulty breathing.  She has chronic chest pain chronic difficulty breathing that she feels is worsened today.  Dry cough, no fevers.  No abdominal pain nausea or vomiting.  Feels the chest pain is rating down both of her arms.  Was given nitroglycerin and aspirin prior to arrival with improvement per EMS.   Past Medical History Past Medical History:  Diagnosis Date   Anemia    Anxiety    Bilateral carotid artery stenosis    Carotid duplex 11/979: 1-39% LICA, 60-79% RICA, >50% RECA, f/u 1 yr suggested   CAD (coronary artery disease) of bypass graft 5/01; 3/'02, 8/'03, 10/'04; 1/15   PCI x 5 to SVG-D1    CAD in native artery 07/1993   3 Vessel Disease (LAD-D1 & RCA) -- CABG (Dx in setting of inferior STEMI-PTCA of RCA)   COPD mixed type    Followed by Dr. Delton Coombes "pulmonologist said no COPD"   Depression with anxiety    Diabetes mellitus type 2 in obese    Diarrhea    started after cholecystectomy and mass removed from intestine   Dyslipidemia, goal LDL below 70    08/2012: TC 137, TG 200, HDL 32!, LDL 45; on statin (followed  by Dr.Deterding)   ESRD (end stage renal disease) 1991   s/p Cadaveric Renal Transplant [facility] - Dr. 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  Had having having lots of nausea and vomiting with poor p.o. intake.  Thought to have QT prolongation with multiple medications involved and hypomagnesemia, hypokalemia.  Tikosyn discontinued along with Zoloft and Phenergan.   Hypertension associated with diabetes    Mild aortic stenosis by prior echocardiogram 07/2019   Echo:  Mild aortic stenosis (gradients: Mean 14.3 mmHg -peak 24.9 mmHg).   Morbid obesity    MRSA (methicillin resistant staph aureus) culture positive    OSA (obstructive sleep apnea)    no longer on CPAP or home O2, states she doesn't need now after lap band   PAD (peripheral artery disease) 08/2013   LEA Dopplers to be read by Dr. Kirke Corin   PAF (paroxysmal atrial fibrillation) 06/2014   Noted on CardioNet Monitor  - --> rhythm  control with Tikosyn (Dr. Johney Frame); converted from warfarin to apixaban for anticoagulation.   Pneumonia    Recurrent boils    Bilateral Groin   Rheumatoid arthritis    Per Patient Report; associated with OA   S/p cadaver renal transplant 1991   [facility]   Patient Active Problem List   Diagnosis Date Noted   Cardiogenic shock 11/22/2022   Pressure injury of skin 11/07/2022   Diuretic-induced hypokalemia 06/17/2022   Insulin-requiring or dependent type II diabetes mellitus 06/17/2022   Spondylosis of lumbar region without myelopathy or radiculopathy 12/19/2021   Encounter for palliative care involving management of  pain 12/19/2021   COPD with acute exacerbation 12/07/2021   Goals of care, counseling/discussion 10/03/2021   Class 2 obesity 09/25/2021   Longstanding persistent atrial fibrillation    Chronic combined systolic and diastolic heart failure 08/06/2021   Obesity (BMI 30-39.9) 08/05/2021   Polymorphic ventricular tachycardia    Bradycardia    COPD (chronic obstructive pulmonary disease)    History of renal transplant    Coronary stent restenosis due to scar tissue    Chronic respiratory failure with hypoxia 01/01/2021   Candidal skin infection 08/13/2020   Chronic bronchitis, mucopurulent 08/24/2019   Current chronic use of systemic steroids 07/23/2019   OSA (obstructive sleep apnea)    Severe episode of recurrent major depressive disorder, with psychotic features 02/16/2019   Obstructive chronic bronchitis without exacerbation 11/03/2018   Long term (current) use of antithrombotics/antiplatelets 04/28/2018   LBBB (left bundle branch block) 10/07/2017   Primary osteoarthritis of right knee 08/18/2017   Mild aortic stenosis by prior echocardiogram 01/28/2017   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   Normocytic anemia 10/13/2016   Fibromyalgia 03/30/2016   Other spondylosis with radiculopathy, lumbar region 03/30/2016   Type 2 diabetes mellitus with hyperlipidemia 03/30/2016   Paroxysmal atrial fibrillation (HCC); CHA2DS2VASc score F, HTN, CAD, CVA = 5 06/28/2014   Debility 06/28/2014   CAD S/P percutaneous coronary angioplasty - PCI x 5 to SVG-D1 09/11/2013    Class: Diagnosis of   Essential hypertension    PAD (peripheral artery disease) (HCC) 08/17/2013   CAD (coronary artery disease) of bypass graft 10/08/2012   Stenosis of right carotid artery without infarction 10/08/2012   Dyslipidemia, goal LDL below 70 10/08/2012    Class: Diagnosis of   Mitral annular calcification 10/08/2012   Renal transplant disorder 06/12/2012   Chronic allergic rhinitis 04/29/2011   Extrinsic asthma  09/09/2007   GERD 09/09/2007   Morbid obesity - s/p Lap Band 9/'05 05/07/2004    Class: Diagnosis of   Home Medication(s) Prior to Admission medications   Medication Sig Start Date End Date Taking? Authorizing Provider  albuterol (VENTOLIN HFA) 108 (90 Base) MCG/ACT inhaler TAKE 2 PUFFS BY MOUTH EVERY 6 HOURS AS NEEDED FOR WHEEZE OR SHORTNESS OF BREATH Patient taking differently: Inhale 2 puffs into the lungs every 6 (six) hours as needed for shortness of breath. 07/30/22   Leslye Peer, MD  arformoterol (BROVANA) 15 MCG/2ML NEBU Take 2 mLs (15 mcg total) by nebulization 2 (two) times daily. 08/30/19   Leslye Peer, MD  azaTHIOprine (IMURAN) 50 MG tablet Take 2 & 1/2 tablets (125 mg) by mouth daily at 3pm Patient taking differently: Take 125 mg by mouth daily. 10/21/21   Pokhrel, Rebekah Chesterfield, MD  bisacodyl (DULCOLAX) 5 MG EC tablet Take 1 tablet (5 mg total) by mouth daily as needed for moderate constipation. 10/05/21   Drema Dallas, MD  budesonide (PULMICORT) 0.5  MG/2ML nebulizer solution Take 2 mLs (0.5 mg total) by nebulization 2 (two) times daily. Patient taking differently: Take 2 mLs by nebulization 2 (two) times daily as needed (wheezing). 08/24/19   [name], MD  calcitRIOL (ROCALTROL) 0.25 MCG capsule Take 0.25 mcg by mouth See admin instructions. Takes every 3 days.    [provider]  clopidogrel (PLAVIX) 75 MG tablet Take 1 tablet (75 mg total) by mouth daily. 11/11/22 01/10/23  Noralee Stain, DO  Continuous Blood Gluc Receiver (FREESTYLE LIBRE 2 READER) DEVI 1 Act by Does not apply route daily. 06/17/22   [name], MD  Continuous Blood Gluc Sensor (FREESTYLE LIBRE 2 SENSOR) MISC 1 Act by Does not apply route daily. 06/17/22   [name], MD  Continuous Blood Gluc Sensor (FREESTYLE LIBRE 3 SENSOR) MISC 1 Units by Does not apply route 3 (three) times daily. Place 1 sensor on the skin every 14 days. Use to check glucose continuously 11/26/22   Hughie Closs, MD   [employer] VITAMIN C 500 MG tablet TAKE 1 TABLET (500 MG TOTAL) BY MOUTH DAILY. Patient taking differently: Take 500 mg by mouth daily. 02/03/22   [name], MD  cyclobenzaprine (FLEXERIL) 10 MG tablet TAKE 1 TABLET BY MOUTH TWICE A DAY AS NEEDED FOR MUSCLE SPASM 12/03/22   [name], MD  ELIQUIS 5 MG TABS tablet TAKE 1 TABLET BY MOUTH TWICE A DAY 10/22/22   [name], MD  empagliflozin (JARDIANCE) 10 MG TABS tablet Take 1 tablet (10 mg total) by mouth daily. 11/11/22 01/10/23  Noralee Stain, DO  Ensure Max Protein (ENSURE MAX PROTEIN) LIQD Take 330 mLs (11 oz total) by mouth 2 (two) times daily. 12/12/21   Narda Bonds, MD  fluticasone (FLONASE) 50 MCG/ACT nasal spray PLACE 2 SPRAYS INTO BOTH NOSTRILS 2 TIMES DAILY. Patient taking differently: Place 2 sprays into both nostrils 2 (two) times daily. 08/04/22   [name], MD  furosemide (LASIX) 40 MG tablet Take 1 tablet (40 mg total) by mouth 2 (two) times daily. 11/26/22   Hughie Closs, MD  gabapentin (NEURONTIN) 100 MG capsule Take 1 capsule (100 mg total) by mouth at bedtime. Patient taking differently: Take 100 mg by mouth daily as needed (For pain). 09/11/21   Kathlen Mody, MD  glucose blood (GLUCOCOM TEST) test strip Use as instructed 11/26/22   Hughie Closs, MD  guaiFENesin-dextromethorphan (ROBITUSSIN DM) 100-10 MG/5ML syrup Take 15 mLs by mouth every 4 (four) hours as needed for cough. 11/23/22   Kathlen Mody, MD  hydrocortisone (ANUSOL-HC) 25 MG suppository Place 1 suppository (25 mg total) rectally 2 (two) times daily. 11/23/22   Kathlen Mody, MD  insulin glargine-yfgn (SEMGLEE) 100 UNIT/ML injection Inject 0.08 mLs (8 Units total) into the skin daily. 11/24/22   Kathlen Mody, MD  Insulin Pen Needle (PEN NEEDLES) 32G X 4 MM MISC Use as needed to administer insulin. E11.9 02/01/22   [name], MD  isosorbide mononitrate (IMDUR) 30 MG 24 hr tablet Take 1 tablet (30 mg total) by mouth daily. 11/12/22   Almon Hercules, MD   KLOR-CON M10 10 MEQ tablet TAKE 1 TABLET BY MOUTH 2 TIMES DAILY. 09/19/22   [name], MD  lamoTRIgine (LAMICTAL) 25 MG tablet TAKE 1 TABLET BY MOUTH EVERYDAY AT BEDTIME Patient taking differently: Take 25 mg by mouth daily. 02/19/22   Ranelle Oyster, MD  Lancets (FREESTYLE) lancets Use as directed up to four times daily 11/26/22  Hughie Closs, MD  metFORMIN (GLUCOPHAGE) 500 MG tablet Take 500 mg by mouth 2 (two) times daily. 12/08/21   [provider]  metoprolol tartrate (LOPRESSOR) 50 MG tablet Take 1 tablet (50 mg total) by mouth 2 (two) times daily. 11/10/22 01/09/23  Noralee Stain, DO  montelukast (SINGULAIR) 10 MG tablet TAKE 1 TABLET BY MOUTH EVERYDAY AT BEDTIME Patient taking differently: Take 10 mg by mouth at bedtime. 05/04/22   Leslye Peer, MD  Multiple Vitamin (MULTIVITAMIN WITH MINERALS) TABS tablet Take 1 tablet by mouth daily. 12/13/21   Narda Bonds, MD  nitroGLYCERIN (NITROSTAT) 0.4 MG SL tablet PLACE 1 TAB UNDER TONGUE EVERY FOR CHEST PAIN Patient taking differently: Place 0.4 mg under the tongue every 5 (five) minutes as needed for chest pain. 06/30/22   Marykay Lex, MD  oxyCODONE (OXY IR/ROXICODONE) 5 MG immediate release tablet Take 1 tablet (5 mg total) by mouth every 6 (six) hours as needed for severe pain. 12/02/22   [name], MD  pantoprazole (PROTONIX) 40 MG tablet TAKE 1 TABLET BY MOUTH EVERY DAY 08/05/22   [name], MD  predniSONE (DELTASONE) 5 MG tablet Take 5 mg by mouth daily with breakfast.    [provider]  promethazine (PHENERGAN) 25 MG tablet Take 1 tablet (25 mg total) by mouth every 6 (six) hours as needed for nausea or vomiting. 05/20/22   Loyola Mast, MD  ranolazine (RANEXA) 1000 MG SR tablet TAKE 1 TABLET BY MOUTH TWICE A DAY 10/31/22   [name], MD  rosuvastatin (CRESTOR) 20 MG tablet TAKE 1 TABLET BY MOUTH EVERY DAY 07/29/22   Marykay Lex, MD  sertraline (ZOLOFT) 100 MG tablet Take 2  tablets (200 mg total) by mouth daily. 06/16/22   [name], MD  witch hazel-glycerin (TUCKS) pad Apply topically as needed for itching. 11/23/22   Kathlen Mody, MD                                                                                                                                    Past Surgical History Past Surgical History:  Procedure Laterality Date   ABDOMINAL AORTOGRAM N/A 04/21/2018   Procedure: ABDOMINAL AORTOGRAM;  Surgeon: Marykay Lex, MD;  Location: [facility] INVASIVE CV LAB;  Service: Cardiovascular;  Laterality: N/A;   CATHETER REMOVAL     CHOLECYSTECTOMY N/A 10/29/2014   Procedure: LAPAROSCOPIC CHOLECYSTECTOMY WITH INTRAOPERATIVE CHOLANGIOGRAM;  Surgeon: Glenna Fellows, MD;  Location: [facility] ORS;  Service: General;  Laterality: N/A;   CORONARY ANGIOPLASTY  1994   x5   CORONARY ARTERY BYPASS GRAFT  1995   LIMA-LAD, SVG-RPDA, SVG-D1   CORONARY PRESSURE/FFR STUDY N/A 02/20/2021   Procedure: INTRAVASCULAR PRESSURE WIRE/FFR STUDY;  Surgeon: Marykay Lex, MD;  Location: [facility] INVASIVE CV LAB;  Service: Cardiovascular;  Laterality: N/A;   CORONARY STENT INTERVENTION N/A 02/20/2021   Procedure: PERCUTANEOUS CORONARY STENT  INTERVENTION;  Surgeon: Marykay Lex, MD;  Location: [facility] INVASIVE CV LAB; ostLCx 60% (Neg RFR 0.96);; SVG- D2 recurrent 90% ISR & 95% native D2 after graft-> DES PCI of 95% anastomotic D2 lesion (Onyx Frontier 2.25 mm x 12 mm => 2.75 mm @ overlap, 2.5 distal.);PTCA of ISR in body of graft. ->  2.5 mm scoring balloon & post-dil w/ 2.75 mm [place] balloon   ESOPHAGOGASTRODUODENOSCOPY N/A 10/15/2016   Procedure: ESOPHAGOGASTRODUODENOSCOPY (EGD);  Surgeon: Charlott Rakes, MD;  Location: [facility] ENDOSCOPY;  Service: Endoscopy;  Laterality: N/A;   I & D EXTREMITY Right 01/29/2018   Procedure: IRRIGATION AND DEBRIDEMENT THUMB;  Surgeon: Knute Neu, MD;  Location: [facility] OR;  Service: Plastics;  Laterality: Right;   INCISE AND DRAIN ABCESS     KIDNEY TRANSPLANT  1991    KNEE ARTHROSCOPY WITH LATERAL MENISECTOMY Left 12/03/2017   Procedure: LEFT KNEE ARTHROSCOPY WITH LATERAL MENISECTOMY;  Surgeon: Frederico Hamman, MD;  Location: [facility] OR;  Service: Orthopedics;  Laterality: Left;   LAPAROSCOPIC GASTRIC BANDING  04/2004; 10/'09, 2/'10   Port Replacement x 2   LEFT HEART CATH AND CORONARY ANGIOGRAPHY N/A 02/20/2021   Procedure: LEFT HEART CATH AND CORONARY ANGIOGRAPHY;  Surgeon: Marykay Lex, MD;  Location: [facility] INVASIVE CV LAB;  Service: Cardiovascular;  Laterality: N/A;   LEFT HEART CATH AND CORS/GRAFTS ANGIOGRAPHY N/A 04/21/2018   Procedure: LEFT HEART CATH AND CORS/GRAFTS ANGIOGRAPHY;  Surgeon: Marykay Lex, MD;  Location: [facility] INVASIVE CV LAB;  Ost-Prox LAD 50% - proxLAD (pre & post D1) 100% CTO. Cx - patent, small OM1 (stable ~ ostial OM1 90%, too small for PCI) & 2 small LPL; Ost-distal RCA 100% CTO.  LIMA-LAD (not injected); SVG-dRCA patent, SVG-D1 - insertion stent ~20% ISR - Severe R CFA disease w/ focal Sub TO   LEFT HEART CATH AND CORS/GRAFTS ANGIOGRAPHY  5/'01, 3/'02, 8/'03, 10/'04; 1/'15   08/22/2013: LAD & RCA 100%; LIMA-LAD & SVG-rPDA patent; Cx-- OM1 60%, OM2 ostial ~50%; SVG-D1 - 80% mid, 50% distal ISR --PCI   LEFT HEART CATH AND CORS/GRAFTS ANGIOGRAPHY N/A 01/31/2021   Procedure: LEFT HEART CATH AND CORS/GRAFTS ANGIOGRAPHY;  Surgeon: Dolores Patty, MD;  Location: [facility] INVASIVE CV LAB;;   LEFT HEART CATH AND CORS/GRAFTS ANGIOGRAPHY N/A 09/05/2021   Procedure: LEFT HEART CATH AND CORS/GRAFTS ANGIOGRAPHY;  Surgeon: Marykay Lex, MD;  Location: [facility] INVASIVE CV LAB;  Service: Cardiovascular;  Laterality: N/A;   LEFT HEART CATH AND CORS/GRAFTS ANGIOGRAPHY N/A 08/21/2021   Procedure: LEFT HEART CATH AND CORS/GRAFTS ANGIOGRAPHY;  Surgeon: Lyn Records, MD;  Location: [facility] INVASIVE CV LAB;  Service: Cardiovascular;  Laterality: N/A;   LEFT HEART CATHETERIZATION WITH CORONARY/GRAFT ANGIOGRAM N/A 08/23/2013   Procedure: LEFT HEART CATHETERIZATION WITH  Isabel Caprice;  Surgeon: Iran Ouch, MD;  Location: [facility] CATH LAB;  Service: Cardiovascular;  Laterality: N/A;   Lower Extremity Arterial Dopplers  08/2013   ABI: R 0.96, L 1.04   MULTIPLE TOOTH EXTRACTIONS  age 89   NM MYOVIEW LTD  03/2016   EF 62%. LOW RISK. C/W prior MI - no Ischemia. Apical hypokinesis.   PERCUTANEOUS CORONARY STENT INTERVENTION (PCI-S)  5/'01, 3/'02, 8/'03, 10/'04;   '01 - S660 BMS 2.5 x 9 - dSVG-D1 into D1; '02- post-stent stenosis - 2.5 x 8 Pixel BMS; '8\03: ISR/Thrombosis into native D1 - AngioJet, 2.5 x 13 Pixel; '04 - ISR 95% - covered stented area with Taxus DES 2.5 mm x 20 (2.88)   PERCUTANEOUS CORONARY  STENT INTERVENTION (PCI-S)  08/23/2013   Procedure: PERCUTANEOUS CORONARY STENT INTERVENTION (PCI-S);  Surgeon: Iran Ouch, MD;  [facility] C[facility] LAB;  Service: Cardiovascular;;mid SVG-D1 80%; distal stent ~50% ISR; Promus Prermier DES 2.75 mm xc 20 mm (2.8 mm)   PORT-A-CATH REMOVAL     kidney   TRANSTHORACIC ECHOCARDIOGRAM  07/2019   a) 07/2019: EF 55 to 60%.  No LVH.  Paradoxical septal WM-s/p CABG.  GRII DD.  Nl RV size and fxn.  Mild bilateral atrial dilation.  Mod MAC.  Trace MR.  Mild AS (gradients: Mean 14.3 mmHg -peak 24.9 mmHg).; B) 06/2020: EF 40 to 45%.  Moderate concentric LVH.  GRII DD.  Elevated LAP.  Mod HK mid Apical Ant-AntSept wall & mild Apical Dyskinesis.  Mod LA dilation.  Mild MR.  AoV sclerosis w/o AS.   TRANSTHORACIC ECHOCARDIOGRAM  01/30/2021   EF 55 to 60%.  Mild LVH.  GR 1 DD.  Elevated LAP.  Moderate LA dilation.  Mild MR with mild MS.  Mild aortic valve stenosis.:   TUBAL LIGATION     wrist fistula repair Left    dialysis for one year   Family History Family History  Problem Relation Age of Onset   Cancer Mother        liver   Heart disease Father    Cancer Father        colon   Arrhythmia Brother        Atrial Fibrillation   Arrhythmia Paternal Aunt        Atrial Fibrillation    Social History Social  History   Tobacco Use   Smoking status: Former    Packs/day: 1.00    Years: 30.00    Additional pack years: 0.00    Total pack years: 30.00    Types: Cigarettes    Quit date: 08/17/2002    Years since quitting: 20.3   Smokeless tobacco: Never  Vaping Use   Vaping Use: Never used  Substance Use Topics   Alcohol use: No   Drug use: No   Allergies Tetracycline, Niacin, Niaspan [niacin er], Sulfa antibiotics, Sulfonamide derivatives, Codeine, Erythromycin, Hydromorphone hcl, Morphine and related, Nalbuphine, Sulfasalazine, and Tape  Review of Systems Review of Systems  Constitutional:  Positive for fatigue. Negative for activity change and fever.  HENT:  Negative for facial swelling and trouble swallowing.   Eyes:  Negative for discharge and redness.  Respiratory:  Positive for cough, chest tightness and shortness of breath.   Cardiovascular:  Positive for chest pain. Negative for palpitations.  Gastrointestinal:  Negative for abdominal pain and nausea.  Genitourinary:  Negative for dysuria and flank pain.  Musculoskeletal:  Negative for back pain and gait problem.  Skin:  Negative for pallor and rash.  Neurological:  Negative for syncope and headaches.    Physical Exam Vital Signs  I have reviewed the triage vital signs BP (!) 70/52 (BP Location: Right Arm)   Pulse (!) 53   Temp 97.6 F (36.4 C) (Oral)   Resp 20   SpO2 100%  Physical Exam Vitals and nursing note reviewed.  Constitutional:      General: She is not in acute distress.    Appearance: Normal appearance. She is obese. She is ill-appearing.  HENT:     Head: Normocephalic and atraumatic.     Right Ear: External ear normal.     Left Ear: External ear normal.     Nose: Nose normal.     Mouth/Throat:  Mouth: Mucous membranes are moist.  Eyes:     General: No scleral icterus.       Right eye: No discharge.        Left eye: No discharge.  Cardiovascular:     Rate and Rhythm: Normal rate and regular  rhythm.     Pulses: Normal pulses.     Heart sounds: Normal heart sounds.  Pulmonary:     Effort: Pulmonary effort is normal. No respiratory distress.     Breath sounds: Normal breath sounds.  Abdominal:     General: Abdomen is flat.     Palpations: Abdomen is soft.     Tenderness: There is no abdominal tenderness. There is no guarding or rebound.  Musculoskeletal:     Right lower leg: Edema present.     Left lower leg: Edema present.  Skin:    General: Skin is warm and dry.     Capillary Refill: Capillary refill takes less than 2 seconds.  Neurological:     Mental Status: She is alert.  Psychiatric:        Mood and Affect: Mood normal.        Behavior: Behavior normal.     ED Results and Treatments Labs (all labs ordered are listed, but only abnormal results are displayed) Labs Reviewed  COMPREHENSIVE METABOLIC PANEL - Abnormal; Notable for the following components:      Result Value   Sodium 133 (*)    Chloride 96 (*)    CO2 21 (*)    Glucose, Bld 340 (*)    Creatinine, Ser 1.51 (*)    Albumin 2.8 (*)    Alkaline Phosphatase 149 (*)    GFR, Estimated 38 (*)    Anion gap 16 (*)    All other components within normal limits  BRAIN NATRIURETIC PEPTIDE - Abnormal; Notable for the following components:   B Natriuretic Peptide 657.3 (*)    All other components within normal limits  MAGNESIUM - Abnormal; Notable for the following components:   Magnesium 1.4 (*)    All other components within normal limits  T4, FREE - Abnormal; Notable for the following components:   Free T4 1.15 (*)    All other components within normal limits  PROTIME-INR - Abnormal; Notable for the following components:   Prothrombin Time 19.0 (*)    INR 1.6 (*)    All other components within normal limits  AMMONIA - Abnormal; Notable for the following components:   Ammonia 38 (*)    All other components within normal limits  LACTIC ACID, PLASMA - Abnormal; Notable for the following components:    Lactic Acid, Venous 4.1 (*)    All other components within normal limits  I-STAT VENOUS BLOOD GAS, ED - Abnormal; Notable for the following components:   pCO2, Ven 40.8 (*)    pO2, Ven 58 (*)    Sodium 132 (*)    Potassium 5.2 (*)    Calcium, Ion 1.08 (*)    HCT 33.0 (*)    Hemoglobin 11.2 (*)    All other components within normal limits  CBG MONITORING, ED - Abnormal; Notable for the following components:   Glucose-Capillary 305 (*)    All other components within normal limits  TROPONIN I (HIGH SENSITIVITY) - Abnormal; Notable for the following components:   Troponin I (High Sensitivity) 27 (*)    All other components within normal limits  SARS CORONAVIRUS 2 BY RT PCR  CULTURE, BLOOD (ROUTINE X 2)  CULTURE, BLOOD (ROUTINE X 2)  MRSA NEXT GEN BY PCR, NASAL  LIPASE, BLOOD  TSH  ETHANOL  CBC WITH DIFFERENTIAL/PLATELET  URINALYSIS, ROUTINE W REFLEX MICROSCOPIC  RAPID URINE DRUG SCREEN, HOSP PERFORMED  LACTIC ACID, PLASMA  CBC WITH DIFFERENTIAL/PLATELET  CBC WITH DIFFERENTIAL/PLATELET  TROPONIN I (HIGH SENSITIVITY)                                                                                                                          Radiology DG Chest Portable 1 View  Result Date: 11/17/2022 CLINICAL DATA:  Chest pain. EXAM: PORTABLE CHEST 1 VIEW COMPARISON:  Chest radiograph dated 11/28/2022. FINDINGS: The heart is enlarged. Vascular calcifications are seen in the aortic arch. Median sternotomy wires are redemonstrated with multiple wire fractures. Mild diffuse bilateral interstitial opacities are noted. There is mild bibasilar atelectasis. No pleural effusion or pneumothorax. Degenerative changes are seen in the spine. IMPRESSION: 1. Mild diffuse bilateral interstitial opacities, which may represent pulmonary edema or atypical infection superimposed on chronic interstitial lung disease. 2. Cardiomegaly. Electronically Signed   By: Romona Curls M.D.   On: 11/21/2022 12:53     Pertinent labs & imaging results that were available during my care of the patient were reviewed by me and considered in my medical decision making (see MDM for details).  Medications Ordered in ED Medications  0.9 %  sodium chloride infusion (250 mLs Intravenous New Bag/Given 12/01/2022 1353)  norepinephrine (LEVOPHED) 4mg  in (0.016 mg/mL) premix infusion (10 mcg/min Intravenous Rate/Dose Change 12/10/2022 1307)  amiodarone (NEXTERONE) 1.8 mg/mL load via infusion 150 mg (150 mg Intravenous Bolus from Bag 11/18/2022 1349)    Followed by  amiodarone (NEXTERONE PREMIX) 360-4.14 MG/200ML-% (1.8 mg/mL) IV infusion (60 mg/hr Intravenous New Bag/Given 11/23/2022 1349)    Followed by  amiodarone (NEXTERONE PREMIX) 360-4.14 MG/200ML-% (1.8 mg/mL) IV infusion (has no administration in time range)  vancomycin (VANCOREADY) IVPB 1250 mg/250 mL (1,250 mg Intravenous New Bag/Given 11/20/2022 1431)  ceFEPIme (MAXIPIME) 2 g in sodium chloride 0.9 % 100 mL IVPB (has no administration in time range)  vancomycin (VANCOCIN) IVPB 1000 mg/200 mL premix (has no administration in time range)  magnesium sulfate IVPB 2 g 50 mL (has no administration in time range)  ceFEPIme (MAXIPIME) 2 g in sodium chloride 0.9 % 100 mL IVPB (0 g Intravenous Stopped 12/05/2022 1430)  Procedures .Critical Care  Performed by: Sloan Leiter, DO Authorized by: Sloan Leiter, DO   Critical care provider statement:    Critical care time (minutes):  90   Critical care time was exclusive of:  Separately billable procedures and treating other patients   Critical care was necessary to treat or prevent imminent or life-threatening deterioration of the following conditions:  Respiratory failure, sepsis, shock and cardiac failure   Critical care was time spent personally by me on the following activities:  Development of  treatment plan with patient or surrogate, discussions with consultants, evaluation of patient's response to treatment, examination of patient, ordering and review of laboratory studies, ordering and review of radiographic studies, ordering and performing treatments and interventions, pulse oximetry, re-evaluation of patient's condition, review of old charts and obtaining history from patient or surrogate   Care discussed with: admitting provider   Ultrasound ED Peripheral IV (Provider)  Date/Time: 11/21/2022 2:29 PM  Performed by: Sloan Leiter, DO Authorized by: Sloan Leiter, DO   Procedure details:    Indications: hypotension, multiple failed IV attempts and poor IV access     Skin Prep: chlorhexidine gluconate     Location:  Left AC   Angiocath:  18 G   Bedside Ultrasound Guided: Yes     Images: not archived     Patient tolerated procedure without complications: Yes     Dressing applied: Yes   Ultrasound ED Peripheral IV (Provider)  Date/Time: 11/25/2022 2:30 PM  Performed by: Sloan Leiter, DO Authorized by: Sloan Leiter, DO   Procedure details:    Indications: multiple failed IV attempts and poor IV access     Skin Prep: chlorhexidine gluconate     Location:  Right AC   Angiocath:  20 G   Bedside Ultrasound Guided: Yes     Images: not archived     Patient tolerated procedure without complications: Yes     Dressing applied: Yes     (including critical care time)  Medical Decision Making / ED Course    Medical Decision Making:    Avionna Bower is a 67 y.o. female  with past medical history as below, significant for CAD, COPD, DM, HLD, PAF, PAD, obesity, home o2 dependence (6-7L) who presents to the ED with complaint of chest pain/dib. Pt was encoded as STEMI, reviewed EKG on arrival and cancelled, appears to be LBBB which is known problem for her. D/w Dr Excell Seltzer cardiology at bedside, pt well known to him. . The complaint involves an extensive differential  diagnosis and also carries with it a high risk of complications and morbidity.  Serious etiology was considered. Ddx includes but is not limited to: Differential includes all life-threatening causes for chest pain. This includes but is not exclusive to acute coronary syndrome, aortic dissection, pulmonary embolism, cardiac tamponade, community-acquired pneumonia, pericarditis, musculoskeletal chest wall pain, etc.   Complete initial physical exam performed, notably the patient  was tachycardic, hypotensive, wide-complex tachycardia on telemetry he appears to be left bundle branch block.   Does not appear to be a STEMI. Code stemi cancelled, reviewed w/ Dr Excell Seltzer  Reviewed and confirmed nursing documentation for past medical history, family history, social history.  Vital signs reviewed.    Clinical Course as of 12/02/2022 1457  Sun Dec 06, 2022  1257 Hypotensive, tachycardic, appears to be LBBB, tachy. Give IVF, start levo peripherally  [SG]  1259 Placed on NRB, she is primarily mouth breathing. 10L, pulse ox  99-100% [SG]  1337 Spoke w/ Dr Sylvie Farrier, agree w/ [place] Ponder and levo, will see in consult. Possible aflutter in background per Dr Excell Seltzer, recommends heparin.  [SG]  1339 CXR with interstial opacities, PNA vs CHF, cover with vanc/cefepime given allergy [SG]  1409 HR improved w/ amio bolus, rate 90's. Difficult to obtain BP [SG]  1426 Echo 3/24 35-50%   [SG]  1435 Spoke with Dr Katrinka Blazing pccm, will come see pt, accepted for admit  [SG]    Clinical Course User Index [SG] Sloan Leiter, DO   Discussed with cardiology Dr. Cristal Deer and Dr. Excell Seltzer, favor hypotension likely secondary to severe PAD.  Difficult to obtain blood pressure via automatic reading.  She is speaking comfortably.  Started on Levophed peripherally given further IV fluids.  Heart rate greatly improved after amnio bolus, heart rate less than 100 appears to be A-fib versus flutter.  Evidence of pneumonia on chest x-ray, start  Vanco and cefepime given extensive allergies.  HR improved w/ amio, start heparin given afib  She is on 10L NRB, she favors mouth breathing and cannula was difficult for her to tolerate, doing well on NRB  Hemodynamics variable but difficult to assess given severe PAD, do not believe art line would be of much given severe PAD  Hemodynamics re-assessment completed per sepsis protocol  See cardiology consult note, Dr Excell Seltzer   Prior [name] fistula >30 yrs ago, s/p transplant, no HD in 30 yrs  Admitted to PCCM      Additional history obtained: -Additional history obtained from ems -External records from outside source obtained and reviewed including: Chart review including previous notes, labs, imaging, consultation notes including prior ekgs, prior labs/imaging/ prior admission/home meds   Lab Tests: -I ordered, reviewed, and interpreted labs.   The pertinent results include:   Labs Reviewed  COMPREHENSIVE METABOLIC PANEL - Abnormal; Notable for the following components:      Result Value   Sodium 133 (*)    Chloride 96 (*)    CO2 21 (*)    Glucose, Bld 340 (*)    Creatinine, Ser 1.51 (*)    Albumin 2.8 (*)    Alkaline Phosphatase 149 (*)    GFR, Estimated 38 (*)    Anion gap 16 (*)    All other components within normal limits  BRAIN NATRIURETIC PEPTIDE - Abnormal; Notable for the following components:   B Natriuretic Peptide 657.3 (*)    All other components within normal limits  MAGNESIUM - Abnormal; Notable for the following components:   Magnesium 1.4 (*)    All other components within normal limits  T4, FREE - Abnormal; Notable for the following components:   Free T4 1.15 (*)    All other components within normal limits  PROTIME-INR - Abnormal; Notable for the following components:   Prothrombin Time 19.0 (*)    INR 1.6 (*)    All other components within normal limits  AMMONIA - Abnormal; Notable for the following components:   Ammonia 38 (*)    All other  components within normal limits  LACTIC ACID, PLASMA - Abnormal; Notable for the following components:   Lactic Acid, Venous 4.1 (*)    All other components within normal limits  I-STAT VENOUS BLOOD GAS, ED - Abnormal; Notable for the following components:   pCO2, Ven 40.8 (*)    pO2, Ven 58 (*)    Sodium 132 (*)    Potassium 5.2 (*)    Calcium, Ion 1.08 (*)  HCT 33.0 (*)    Hemoglobin 11.2 (*)    All other components within normal limits  CBG MONITORING, ED - Abnormal; Notable for the following components:   Glucose-Capillary 305 (*)    All other components within normal limits  TROPONIN I (HIGH SENSITIVITY) - Abnormal; Notable for the following components:   Troponin I (High Sensitivity) 27 (*)    All other components within normal limits  SARS CORONAVIRUS 2 BY RT PCR  CULTURE, BLOOD (ROUTINE X 2)  CULTURE, BLOOD (ROUTINE X 2)  MRSA NEXT GEN BY PCR, NASAL  LIPASE, BLOOD  TSH  ETHANOL  CBC WITH DIFFERENTIAL/PLATELET  URINALYSIS, ROUTINE W REFLEX MICROSCOPIC  RAPID URINE DRUG SCREEN, HOSP PERFORMED  LACTIC ACID, PLASMA  CBC WITH DIFFERENTIAL/PLATELET  CBC WITH DIFFERENTIAL/PLATELET  TROPONIN I (HIGH SENSITIVITY)    Notable for as above  EKG   EKG Interpretation  Date/Time:  Sunday December 06 2022 12:19:47 EDT Ventricular Rate:  130 PR Interval:    QRS Duration: 207 QT Interval:  423 QTC Calculation: 623 R Axis:   -63 Text Interpretation: Sinus tachycardia Left bundle branch block known lbbb similar prior 3/24 similar Confirmed by Tanda Rockers ([id number]) on 11/16/2022 12:31:27 PM         Imaging Studies ordered: I ordered imaging studies including CXR I independently visualized the following imaging with scope of interpretation limited to determining acute life threatening conditions related to emergency care; findings noted above, significant for pna I independently visualized and interpreted imaging. I agree with the radiologist interpretation   Medicines  ordered and prescription drug management: Meds ordered this encounter  Medications   0.9 %  sodium chloride infusion   norepinephrine (LEVOPHED) 4mg  in (0.016 mg/mL) premix infusion    Order Specific Question:   IV Access    Answer:   Peripheral   DISCONTD: glucagon (human recombinant) (GLUCAGEN) injection 1 mg   DISCONTD: calcium gluconate 1 g/ 50 mL sodium chloride IVPB   DISCONTD: cefTRIAXone (ROCEPHIN) 2 g in sodium chloride 0.9 % 100 mL IVPB    Order Specific Question:   Antibiotic Indication:    Answer:   CAP   DISCONTD: azithromycin (ZITHROMAX) 500 mg in sodium chloride 0.9 % 250 mL IVPB   norepinephrine (LEVOPHED) 4-5 MG/250ML-% infusion SOLN    Yongque, Ma Katrina : cabinet override   FOLLOWED BY Linked Order Group    amiodarone (NEXTERONE) 1.8 mg/mL load via infusion 150 mg    amiodarone (NEXTERONE PREMIX) 360-4.14 MG/200ML-% (1.8 mg/mL) IV infusion    amiodarone (NEXTERONE PREMIX) 360-4.14 MG/200ML-% (1.8 mg/mL) IV infusion   ceFEPIme (MAXIPIME) 2 g in sodium chloride 0.9 % 100 mL IVPB    Order Specific Question:   Antibiotic Indication:    Answer:   HCAP   vancomycin (VANCOREADY) IVPB 1250 mg/250 mL    Order Specific Question:   Indication:    Answer:   Pneumonia   ceFEPIme (MAXIPIME) 2 g in sodium chloride 0.9 % 100 mL IVPB    Order Specific Question:   Antibiotic Indication:    Answer:   HCAP   vancomycin (VANCOCIN) IVPB 1000 mg/200 mL premix    Order Specific Question:   Indication:    Answer:   Pneumonia   magnesium sulfate IVPB 2 g 50 mL    -I have reviewed the patients home medicines and have made adjustments as needed   Consultations Obtained: I requested consultation with the cardiology/pccm,  and discussed lab and imaging findings as well as  pertinent plan - they recommend: admit   Cardiac Monitoring: The patient was maintained on a cardiac monitor.  I personally viewed and interpreted the cardiac monitored which showed an underlying rhythm of: wide  complex tachy, LBBB  Social Determinants of Health:  Diagnosis or treatment significantly limited by social determinants of health: lives alone   Reevaluation: After the interventions noted above, I reevaluated the patient and found that they have improved  Co morbidities that complicate the patient evaluation  Past Medical History:  Diagnosis Date   Anemia    Anxiety    Bilateral carotid artery stenosis    Carotid duplex 08/6107: 1-39% LICA, 60-79% RICA, >50% RECA, f/u 1 yr suggested   CAD (coronary artery disease) of bypass graft 5/01; 3/'02, 8/'03, 10/'04; 1/15   PCI x 5 to SVG-D1    CAD in native artery 07/1993   3 Vessel Disease (LAD-D1 & RCA) -- CABG (Dx in setting of inferior STEMI-PTCA of RCA)   COPD mixed type    Followed by Dr. Delton Coombes "pulmonologist said no COPD"   Depression with anxiety    Diabetes mellitus type 2 in obese    Diarrhea    started after cholecystectomy and mass removed from intestine   Dyslipidemia, goal LDL below 70    08/2012: TC 137, TG 200, HDL 32!, LDL 45; on statin (followed by Dr.Deterding)   ESRD (end stage renal disease) 1991   s/p Cadaveric Renal Transplant [facility] - Dr. 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  Had having having lots of nausea and vomiting with poor p.o. intake.  Thought to have QT prolongation with multiple medications involved and hypomagnesemia, hypokalemia.  Tikosyn discontinued along with Zoloft and Phenergan.   Hypertension associated with diabetes    Mild aortic stenosis by prior echocardiogram 07/2019   Echo:  Mild aortic  stenosis (gradients: Mean 14.3 mmHg -peak 24.9 mmHg).   Morbid obesity    MRSA (methicillin resistant staph aureus) culture positive    OSA (obstructive sleep apnea)    no longer on CPAP or home O2, states she doesn't need now after lap band   PAD (peripheral artery disease) 08/2013   LEA Dopplers to be read by Dr. Kirke Corin   PAF (paroxysmal atrial fibrillation) 06/2014   Noted on CardioNet Monitor  - --> rhythm control with Tikosyn (Dr. Johney Frame); converted from warfarin to apixaban for anticoagulation.   Pneumonia    Recurrent boils    Bilateral Groin   Rheumatoid arthritis    Per Patient Report; associated with OA   S/p cadaver renal transplant 1991   [facility]      Dispostion: Disposition decision including need for hospitalization was considered, and patient admitted to the hospital.    Final Clinical Impression(s) / ED Diagnoses Final diagnoses:  Community acquired pneumonia, unspecified laterality  Acute heart failure, unspecified heart failure type  Atrial fibrillation, unspecified type  Respiratory distress  Shock  Acute respiratory failure with hypoxia     This chart was dictated using voice recognition software.  Despite best efforts  to proofread,  errors can occur which can change the documentation meaning.    Tanda Rockers A, DO 11/26/2022 1501

## 2022-12-06 NOTE — Progress Notes (Signed)
eLink Physician-Brief Progress Note Patient Name: Katherine [name] DOB: 10/10/1955 MRN: [id number]   Date of Service  12/04/2022  HPI/Events of Note  Complains of nausea and pain. Requesting Magic mouthwash.  eICU Interventions          Carilyn Goodpasture 11/26/2022, 9:03 PM

## 2022-12-06 NOTE — ED Notes (Signed)
Phlebotomist attempted blood cultures with no success. Will notify MD.

## 2022-12-06 NOTE — Progress Notes (Signed)
Due to difficulty obtaining an accurate BP through invasive and non-invasive methods, Dr. Vassie Loll liberalized BP goals to systolic >80 / titrate Levophed based on appropriate mentation and BP.

## 2022-12-07 ENCOUNTER — Inpatient Hospital Stay (HOSPITAL_COMMUNITY): Payer: Medicare Other

## 2022-12-07 ENCOUNTER — Inpatient Hospital Stay: Payer: Medicare Other | Admitting: Internal Medicine

## 2022-12-07 ENCOUNTER — Telehealth: Payer: Self-pay | Admitting: Internal Medicine

## 2022-12-07 ENCOUNTER — Other Ambulatory Visit: Payer: Self-pay

## 2022-12-07 DIAGNOSIS — R579 Shock, unspecified: Secondary | ICD-10-CM

## 2022-12-07 DIAGNOSIS — I25708 Atherosclerosis of coronary artery bypass graft(s), unspecified, with other forms of angina pectoris: Secondary | ICD-10-CM

## 2022-12-07 DIAGNOSIS — I4891 Unspecified atrial fibrillation: Secondary | ICD-10-CM

## 2022-12-07 LAB — BASIC METABOLIC PANEL
Anion gap: 14 (ref 5–15)
BUN: 24 mg/dL — ABNORMAL HIGH (ref 8–23)
CO2: 22 mmol/L (ref 22–32)
Calcium: 8.9 mg/dL (ref 8.9–10.3)
Chloride: 97 mmol/L — ABNORMAL LOW (ref 98–111)
Creatinine, Ser: 1.66 mg/dL — ABNORMAL HIGH (ref 0.44–1.00)
GFR, Estimated: 34 mL/min — ABNORMAL LOW (ref >60)
Glucose, Bld: 131 mg/dL — ABNORMAL HIGH (ref 70–99)
Potassium: 3.5 mmol/L (ref 3.5–5.1)
Sodium: 133 mmol/L — ABNORMAL LOW (ref 135–145)

## 2022-12-07 LAB — GLUCOSE, CAPILLARY
Glucose-Capillary: 51 mg/dL — ABNORMAL LOW (ref 70–99)
Glucose-Capillary: 56 mg/dL — ABNORMAL LOW (ref 70–99)
Glucose-Capillary: 63 mg/dL — ABNORMAL LOW (ref 70–99)
Glucose-Capillary: 95 mg/dL (ref 70–99)
Glucose-Capillary: 86 mg/dL (ref 70–99)
Glucose-Capillary: 95 mg/dL (ref 70–99)
Glucose-Capillary: 186 mg/dL — ABNORMAL HIGH (ref 70–99)
Glucose-Capillary: 319 mg/dL — ABNORMAL HIGH (ref 70–99)
Glucose-Capillary: 484 mg/dL — ABNORMAL HIGH (ref 70–99)

## 2022-12-07 LAB — CBC
HCT: 35.5 % — ABNORMAL LOW (ref 36.0–46.0)
Hemoglobin: 11 g/dL — ABNORMAL LOW (ref 12.0–15.0)
MCH: 28.8 pg (ref 26.0–34.0)
MCHC: 31 g/dL (ref 30.0–36.0)
MCV: 92.9 fL (ref 80.0–100.0)
Platelets: 284 10*3/uL (ref 150–400)
RBC: 3.82 MIL/uL — ABNORMAL LOW (ref 3.87–5.11)
RDW: 18.9 % — ABNORMAL HIGH (ref 11.5–15.5)
WBC: 11.5 10*3/uL — ABNORMAL HIGH (ref 4.0–10.5)
nRBC: 0 % (ref 0.0–0.2)

## 2022-12-07 LAB — COOXEMETRY PANEL
Carboxyhemoglobin: 1.4 % (ref 0.5–1.5)
Methemoglobin: <0.7 % (ref 0.0–1.5)
O2 Saturation: 55.3 %
Total hemoglobin: 9.7 g/dL — ABNORMAL LOW (ref 12.0–16.0)

## 2022-12-07 LAB — MAGNESIUM: Magnesium: 2 mg/dL (ref 1.7–2.4)

## 2022-12-07 LAB — PHOSPHORUS: Phosphorus: 4.1 mg/dL (ref 2.5–4.6)

## 2022-12-07 MED ORDER — OXIDIZED CELLULOSE EX PADS
1.0000 | MEDICATED_PAD | Freq: Once | CUTANEOUS | Status: AC
  Administered 2022-12-07: 1 via TOPICAL
  Filled 2022-12-07: qty 1

## 2022-12-07 MED ORDER — ALBUMIN HUMAN 25 % IV SOLN
25.0000 g | Freq: Four times a day (QID) | INTRAVENOUS | Status: AC
  Administered 2022-12-07 – 2022-12-08 (×4): 25 g via INTRAVENOUS
  Filled 2022-12-07 (×4): qty 100

## 2022-12-07 MED ORDER — DEXTROSE 50 % IV SOLN
INTRAVENOUS | Status: AC
  Filled 2022-12-07: qty 50

## 2022-12-07 MED ORDER — CHLORHEXIDINE GLUCONATE CLOTH 2 % EX PADS
6.0000 | MEDICATED_PAD | Freq: Every day | CUTANEOUS | Status: DC
  Administered 2022-12-07 – 2022-12-09 (×5): 6 via TOPICAL

## 2022-12-07 MED ORDER — DEXTROSE 50 % IV SOLN
12.5000 g | INTRAVENOUS | Status: AC
  Administered 2022-12-07: 12.5 g via INTRAVENOUS

## 2022-12-07 MED ORDER — HYDROCORTISONE 20 MG PO TABS: 100.0000 mg | ORAL_TABLET | Freq: Two times a day (BID) | ORAL | Status: DC

## 2022-12-07 MED ORDER — HYDROCORTISONE SOD SUC (PF) 100 MG IJ SOLR
100.0000 mg | Freq: Two times a day (BID) | INTRAMUSCULAR | Status: DC
  Administered 2022-12-07 – 2022-12-08 (×2): 100 mg via INTRAVENOUS
  Filled 2022-12-07 (×2): qty 2

## 2022-12-07 MED ORDER — CYCLOBENZAPRINE HCL 10 MG PO TABS
10.0000 mg | ORAL_TABLET | Freq: Every day | ORAL | Status: DC | PRN
  Administered 2022-12-07: 10 mg via ORAL
  Filled 2022-12-07: qty 1

## 2022-12-07 NOTE — Progress Notes (Signed)
PICC attempted in left brachial vein by Elenore Paddy, RN.  Vein able to be cannulated with excellent blood return with US guidance.  Unable to thread guidewire despite multiple attempts to do so.  Needle repositioned with continued excellent blood return and inability to thread guidewire.  Needle visualized within the vein.  Basilic vein with catheter vessel ratio greater than 100%, unable to locate cephalic vein.  Right arm unable to be used due to brachial arterial line at the axilla.  Bedside RN updated.

## 2022-12-07 NOTE — Telephone Encounter (Signed)
Aundria Mems, a PT from [facility] called to let Dr. Yetta Barre know the patient was admitted to the hospital 12/06/22 for pneumonia. Callback for Katherine [name] is [phone]).

## 2022-12-07 NOTE — Progress Notes (Signed)
NAME:  Katherine [name], MRN:  [id number], DOB:  05-11-1956, LOS: 1 ADMISSION DATE:  11/24/2022, CONSULTATION DATE:  4/21 REFERRING MD:  EDP, CHIEF COMPLAINT:  Chest pain   History of Present Illness:  67 year old woman with chronic respiratory failure on 7 L oxygen at home, HFrEF, known CAD and severe PAD brought in by EMS today for chest discomfort and generalized weakness.  She denies fevers or cough. EMS found her to have heart rate of 140, oxygen saturation 95%, CBG 315, brought in as code STEMI.  Initial EKG showed left bundle branch block, unable to discern whether this was atrial rhythm.  Soft blood pressures were inaccurate, she is a known vasculopath, she was started empirically on Levophed, blood pressure improved from 60s to 160 currently Placed on nonrebreather mask Evaluated by cardiology Labs showed low troponin, lactate 4.1, 3.3 on repeat, no leukocytosis, stable hemoglobin at 10, BNP 725 Chest x-ray showed bilateral interstitial prominence as prior with cardiomegaly Of note CT chest/9/24 shows ILD with patchy areas of groundglass, suspected to be post-COVID and similar to appearance from 07/2021    Pertinent  Medical History  Chronic respiratory failure with hypoxia, on 7 L nasal cannula at home. COPD/ILD -followed by Dr. Delton Coombes HFrEF, EF 35 to 40% on last echo Anterior MI - 1994 s/p PTCA, CABGx3 1995 Persistent AF - on eliquis, tikosyn Renal transplantation 1991, now CKD 3b Gastric bypass  Significant Hospital Events: Including procedures, antibiotic start and stop dates in addition to other pertinent events   04/21 vancomycine and cefepime started  Interim History / Subjective:   Feeling okay today. Discussed how thus far not a candidate for invasive cardiac procedures, unclear of etiology for continued hypotension.  She is agreeable to meeting with palliative care to set goals of care.   Objective   Blood pressure 98/63, pulse 81, temperature 98.4 F (36.9 C),  temperature source Oral, resp. rate 14, weight 84.6 kg, SpO2 92 %.        Intake/Output Summary (Last 24 hours) at 12/07/2022 1333 Last data filed at 12/07/2022 1200 Gross per 24 hour  Intake 1942.06 ml  Output 175 ml  Net 1767.06 ml   Filed Weights   12/07/22 0221  Weight: 84.6 kg   Examination: General: ill appearing, no acute distress HENT: normocephalic. Dry mucous membranes  Lungs: crackles at lung bases bilaterally Cardiovascular: irregularly irregular. systolic murmur 2/6  Abdomen: soft non-tender, mild suprapubic tenderness Extremities: no edema, warm Neuro: alert and oriented x 3  Resolved Hospital Problem list   N/a  Assessment & Plan:   Shock - cardiogenic vs septic vs adrenal insufficiency Continues to require levophed, arterial line placed yesterday for more accurate BP readings. Will add PICC today and check CVP/coox. She does mention burning with urination two days ago prior to abx, possible urosepsis. Stress dose steroids with her chronic steroid use -continue peripheral levophed -coox/CVP pending -continue abx as per below -stress dose steroids with 100 mg hydrocort BID  Leukocytosis Pyuria Concern for UTI Two days ago with burning with urination. This has resolved since abx. Similar to prior UTI's. UA with pyuria, WBC, and bacteria. Will send urine culture with her history of renal transplant. Prior cultures sensitive to cefepime. -cefepime/vanc day 2, narrow based on culture results. If febrile can broaden to meropenem -follow urine and blood cultures  Hx of renal transplant Chronic prednsione Renal function steadily rising, but stable -continue azathioprine and prednisone. Will stress dose with acute illness.  -renal US and  transplant [place] pending  Chronic hypoxic respiratory failure COPD/ILD -continue supplemental oxygen -procal negative, low suspicion for pneumonia.  -monitor volume status closely w/ HFrEF and crackles on exam.  -continue  budesonide, brovana, and yupelri  Atrial fibrillation -continue IV amiodarone to avoid tachycardiomyopathy -continue eliuis 5 mg BID -electrolytes stable, monitor daily  CAD history status post CABG with multiple stenting  Ischemic cardiomyopathy No longer having chest pain. Do not believe repeat echocardiogram would be helpful, as she not a candidate for cardiac catheterization. Possible pain secondary to demand ischemia in setting of hypoperfusion  -continue medical management -appreciate cardiology assistance -continue plavix and crestor -monitor volume status closely, can restart home diuretics if needed  Hx of anxiety/depression -continue zoloft, lamictal, and ativan  Uncontrolled type 2 DM SSI, adjust as needed with stress dose steroids  Normocytic anemia No evidence of acute bleed. Likely multifactorial with anemia of chronic disease with CKD and acute illness. If further decrease in hgb can work up with iron studies and reticulocyte count  Best Practice (right click and "Reselect all SmartList Selections" daily)   Diet/type: clear liquids DVT prophylaxis: DOAC GI prophylaxis: PPI Lines: PICC Foley:  None Code Status:  DNR Last date of multidisciplinary goals of care discussion - discussed current hospital course and illness with patient. Palliative care consult placed  Labs   CBC: Recent Labs  Lab 11/20/2022 1252 11/16/2022 1655 11/30/2022 1805 12/07/22 0113  WBC  --   --  10.6* 11.5*  NEUTROABS  --   --  8.5*  --   HGB 11.2* 11.2* 12.7 11.0*  HCT 33.0* 33.0* 41.0 35.5*  MCV  --   --  93.8 92.9  PLT  --   --  316 284    Basic Metabolic Panel: Recent Labs  Lab 11/16/2022 1230 12/04/2022 1252 11/28/2022 1655 12/07/22 0113  NA 133* 132* 134* 133*  K 4.9 5.2* 4.9 3.5  CL 96*  --   --  97*  CO2 21*  --   --  22  GLUCOSE 340*  --   --  131*  BUN 22  --   --  24*  CREATININE 1.51*  --   --  1.66*  CALCIUM 9.1  --   --  8.9  MG 1.4*  --   --  2.0  PHOS  --   --    --  4.1   GFR: Estimated Creatinine Clearance: 33.3 mL/min (A) (by C-G formula based on SCr of 1.66 mg/dL (H)). Recent Labs  Lab 12/14/2022 1233 11/21/2022 1433 12/09/2022 1805 12/07/22 0113  PROCALCITON  --   --  <0.10  --   WBC  --   --  10.6* 11.5*  LATICACIDVEN 4.1* 3.3*  --   --     Liver Function Tests: Recent Labs  Lab 12/07/2022 1230  AST 29  ALT 12  ALKPHOS 149*  BILITOT 0.8  PROT 7.0  ALBUMIN 2.8*   Recent Labs  Lab 12/08/2022 1230  LIPASE 18   Recent Labs  Lab 12/13/2022 1233  AMMONIA 38*    ABG    Component Value Date/Time   PHART 7.332 (L) 12/15/2022 1655   PCO2ART 38.8 11/25/2022 1655   PO2ART 55 (L) 12/13/2022 1655   HCO3 20.7 12/09/2022 1655   TCO2 22 11/17/2022 1655   ACIDBASEDEF 5.0 (H) 12/15/2022 1655   O2SAT 87 12/01/2022 1655     Coagulation Profile: Recent Labs  Lab 11/22/2022 1230  INR 1.6*    Cardiac Enzymes:  No results for input(s): "CKTOTAL", "CKMB", "CKMBINDEX", "TROPONINI" in the last 168 hours.  HbA1C: Hgb A1c MFr Bld  Date/Time Value Ref Range Status  11/15/2022 02:29 AM 8.3 (H) 4.8 - 5.6 % Final    Comment:    (NOTE)         Prediabetes: 5.7 - 6.4         Diabetes: >6.4         Glycemic control for adults with diabetes: <7.0   06/16/2022 03:59 PM 8.5 (H) 4.6 - 6.5 % Final    Comment:    Glycemic Control Guidelines for People with Diabetes:Non Diabetic:  <6%Goal of Therapy: <7%Additional Action Suggested:  >8%     CBG: Recent Labs  Lab 12/07/22 0439 12/07/22 0502 12/07/22 0519 12/07/22 0547 12/07/22 0758  GLUCAP 63* 51* 56* 95 86    Review of Systems:   Suprapubic pain  Past Medical History:  She,  has a past medical history of Anemia, Anxiety, Bilateral carotid artery stenosis, CAD (coronary artery disease) of bypass graft (5/01; 3/'02, 8/'03, 10/'04; 1/15), CAD in native artery (07/1993), COPD mixed type, Depression with anxiety, Diabetes mellitus type 2 in obese, Diarrhea, Dyslipidemia, goal LDL below 70, ESRD  (end stage renal disease) (1991), Family history of adverse reaction to anesthesia, Fibromyalgia, GERD (gastroesophageal reflux disease), H/O ST elevation myocardial infarction (STEMI) of inferoposterior wall (07/1993), H/O: GI bleed, History of kidney stones, History of stroke (2012), 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   Surgical History:   Past Surgical History:  Procedure Laterality Date   ABDOMINAL AORTOGRAM N/A 04/21/2018   Procedure: ABDOMINAL AORTOGRAM;  Surgeon: Marykay Lex, MD;  Location: [facility] INVASIVE CV LAB;  Service: Cardiovascular;  Laterality: N/A;   CATHETER REMOVAL     CHOLECYSTECTOMY N/A 10/29/2014   Procedure: LAPAROSCOPIC CHOLECYSTECTOMY WITH INTRAOPERATIVE CHOLANGIOGRAM;  Surgeon: Glenna Fellows, MD;  Location: [facility] ORS;  Service: General;  Laterality: N/A;   CORONARY ANGIOPLASTY  1994   x5   CORONARY ARTERY BYPASS GRAFT  1995   LIMA-LAD, SVG-RPDA, SVG-D1   CORONARY PRESSURE/FFR STUDY N/A 02/20/2021   Procedure: INTRAVASCULAR PRESSURE WIRE/FFR STUDY;  Surgeon: Marykay Lex, MD;  Location: [facility] INVASIVE CV LAB;  Service: Cardiovascular;  Laterality: N/A;   CORONARY STENT INTERVENTION N/A 02/20/2021   Procedure: PERCUTANEOUS CORONARY STENT INTERVENTION;  Surgeon: Marykay Lex, MD;  Location: [facility] INVASIVE CV LAB; ostLCx 60% (Neg RFR 0.96);; SVG- D2 recurrent 90% ISR & 95% native D2 after graft-> DES PCI of 95% anastomotic D2 lesion (Onyx Frontier 2.25 mm x 12 mm => 2.75 mm @ overlap, 2.5 distal.);PTCA of ISR in body of graft. ->  2.5 mm scoring balloon & post-dil w/ 2.75 mm  [place]a balloon   ESOPHAGOGASTRODUODENOSCOPY N/A 10/15/2016   Procedure: ESOPHAGOGASTRODUODENOSCOPY (EGD);  Surgeon: Charlott Rakes, MD;  Location: [facility] ENDOSCOPY;  Service: Endoscopy;  Laterality: N/A;   I & D EXTREMITY Right 01/29/2018   Procedure: IRRIGATION AND DEBRIDEMENT THUMB;  Surgeon: Knute Neu, MD;  Location: [facility] OR;  Service: Plastics;  Laterality: Right;   INCISE AND DRAIN ABCESS     KIDNEY TRANSPLANT  1991   KNEE ARTHROSCOPY WITH LATERAL MENISECTOMY Left 12/03/2017   Procedure: LEFT KNEE ARTHROSCOPY WITH LATERAL  MENISECTOMY;  Surgeon: Frederico Hamman, MD;  Location: [facility] OR;  Service: Orthopedics;  Laterality: Left;   LAPAROSCOPIC GASTRIC BANDING  04/2004; 10/'09, 2/'10   Port Replacement x 2   LEFT HEART CATH AND CORONARY ANGIOGRAPHY N/A 02/20/2021   Procedure: LEFT HEART CATH AND CORONARY ANGIOGRAPHY;  Surgeon: Marykay Lex, MD;  Location: [facility] INVASIVE CV LAB;  Service: Cardiovascular;  Laterality: N/A;   LEFT HEART CATH AND CORS/GRAFTS ANGIOGRAPHY N/A 04/21/2018   Procedure: LEFT HEART CATH AND CORS/GRAFTS ANGIOGRAPHY;  Surgeon: Marykay Lex, MD;  Location: [facility] INVASIVE CV LAB;  Ost-Prox LAD 50% - proxLAD (pre & post D1) 100% CTO. Cx - patent, small OM1 (stable ~ ostial OM1 90%, too small for PCI) & 2 small LPL; Ost-distal RCA 100% CTO.  LIMA-LAD (not injected); SVG-dRCA patent, SVG-D1 - insertion stent ~20% ISR - Severe R CFA disease w/ focal Sub TO   LEFT HEART CATH AND CORS/GRAFTS ANGIOGRAPHY  5/'01, 3/'02, 8/'03, 10/'04; 1/'15   08/22/2013: LAD & RCA 100%; LIMA-LAD & SVG-rPDA patent; Cx-- OM1 60%, OM2 ostial ~50%; SVG-D1 - 80% mid, 50% distal ISR --PCI   LEFT HEART CATH AND CORS/GRAFTS ANGIOGRAPHY N/A 01/31/2021   Procedure: LEFT HEART CATH AND CORS/GRAFTS ANGIOGRAPHY;  Surgeon: Dolores Patty, MD;  Location: [facility] INVASIVE CV LAB;;   LEFT HEART CATH AND CORS/GRAFTS ANGIOGRAPHY N/A 09/05/2021   Procedure: LEFT HEART CATH AND CORS/GRAFTS ANGIOGRAPHY;  Surgeon: Marykay Lex, MD;  Location: [facility] INVASIVE CV LAB;  Service: Cardiovascular;  Laterality: N/A;   LEFT HEART CATH AND CORS/GRAFTS ANGIOGRAPHY N/A 08/21/2021   Procedure: LEFT HEART CATH AND CORS/GRAFTS ANGIOGRAPHY;  Surgeon: Lyn Records, MD;  Location: [facility] INVASIVE CV LAB;  Service: Cardiovascular;  Laterality: N/A;   LEFT HEART CATHETERIZATION WITH CORONARY/GRAFT ANGIOGRAM N/A 08/23/2013   Procedure: LEFT HEART CATHETERIZATION WITH Isabel Caprice;  Surgeon: Iran Ouch, MD;  Location: [facility] CATH LAB;  Service: Cardiovascular;  Laterality: N/A;   Lower Extremity Arterial Dopplers  08/2013   ABI: R 0.96, L 1.04   MULTIPLE TOOTH EXTRACTIONS  age 6   NM MYOVIEW LTD  03/2016   EF 62%. LOW RISK. C/W prior MI - no Ischemia. Apical hypokinesis.   PERCUTANEOUS CORONARY STENT INTERVENTION (PCI-S)  5/'01, 3/'02, 8/'03, 10/'04;   '01 - S660 BMS 2.5 x 9 - dSVG-D1 into D1; '02- post-stent stenosis - 2.5 x 8 Pixel BMS; '8\03: ISR/Thrombosis into native D1 - AngioJet, 2.5 x 13 Pixel; '04 - ISR 95% - covered stented area with Taxus DES 2.5 mm x 20 (2.88)   PERCUTANEOUS CORONARY STENT INTERVENTION (PCI-S)  08/23/2013   Procedure: PERCUTANEOUS CORONARY STENT INTERVENTION (PCI-S);  Surgeon: Iran Ouch, MD;  Location: [facility] CATH LAB;  Service: Cardiovascular;;mid SVG-D1 80%; distal stent ~50% ISR; Promus Prermier DES 2.75 mm xc 20 mm (2.8 mm)   PORT-A-CATH REMOVAL     kidney   TRANSTHORACIC ECHOCARDIOGRAM  07/2019   a) 07/2019: EF 55 to 60%.  No LVH.  Paradoxical septal WM-s/p CABG.  GRII DD.  Nl RV size and fxn.  Mild bilateral atrial dilation.  Mod MAC.  Trace MR.  Mild AS (gradients: Mean 14.3 mmHg -peak 24.9 mmHg).; B) 06/2020: EF 40 to 45%.  Moderate concentric LVH.  GRII DD.  Elevated LAP.  Mod HK mid Apical Ant-AntSept wall & mild Apical Dyskinesis.  Mod LA dilation.  Mild MR.  AoV sclerosis w/o AS.   TRANSTHORACIC ECHOCARDIOGRAM  01/30/2021   EF 55 to 60%.  Mild LVH.  GR 1 DD.  Elevated LAP.  Moderate LA  dilation.  Mild MR with mild MS.  Mild aortic valve stenosis.:   TUBAL LIGATION     wrist fistula repair Left    dialysis for one year     Social History:   reports that she quit smoking about 20 years ago. Her smoking use included cigarettes. She has a 30.00 pack-year smoking history. She has never used smokeless tobacco. She reports that she does not drink alcohol and does not use drugs.   Family History:  Her family history includes Arrhythmia in her brother and paternal aunt; Cancer in her father and mother; Heart disease in her father.   Allergies Allergies  Allergen Reactions   Tetracycline Hives    Patient tolerated Doxycycline Dec 2020   Niacin Other (See Comments)    Mouth blisters   Niaspan [Niacin Er] Other (See Comments)    Mouth blisters   Sulfa Antibiotics Nausea Only and Other (See Comments)    "Tears up stomach"   Sulfonamide Derivatives Other (See Comments)    Reaction: per patient "tears her stomach up"   Codeine Nausea And Vomiting   Erythromycin Nausea And Vomiting   Hydromorphone Hcl Nausea And Vomiting   Morphine And Related Nausea And Vomiting   Nalbuphine Nausea And Vomiting    Nubain   Sulfasalazine Nausea Only and Other (See Comments)    per patient "tears her stomach up", "Tears up stomach"   Tape Rash and Other (See Comments)    No "plastic" tape," please----cloth tape only     Home Medications  Prior to Admission medications   Medication Sig Start Date End Date Taking? Authorizing Provider  albuterol (VENTOLIN HFA) 108 (90 Base) MCG/ACT inhaler TAKE 2 PUFFS BY MOUTH EVERY 6 HOURS AS NEEDED FOR WHEEZE OR SHORTNESS OF BREATH Patient taking differently: Inhale 2 puffs into the lungs every 6 (six) hours as needed for shortness of breath. 07/30/22  Yes Leslye Peer, MD  arformoterol (BROVANA) 15 MCG/2ML NEBU Take 2 mLs (15 mcg total) by nebulization 2 (two) times daily. 08/30/19  Yes Leslye Peer, MD  azaTHIOprine (IMURAN) 50 MG tablet Take 2 &  1/2 tablets (125 mg) by mouth daily at 3pm Patient taking differently: Take 125 mg by mouth daily. 10/21/21  Yes Pokhrel, Laxman, MD  bisacodyl (DULCOLAX) 5 MG EC tablet Take 1 tablet (5 mg total) by mouth daily as needed for moderate constipation. 10/05/21  Yes Drema Dallas, MD  budesonide (PULMICORT) 0.5 MG/2ML nebulizer solution Take 2 mLs (0.5 mg total) by nebulization 2 (two) times daily. Patient taking differently: Take 2 mLs by nebulization 2 (two) times daily as needed (wheezing). 08/24/19   [name], MD  calcitRIOL (ROCALTROL) 0.25 MCG capsule Take 0.25 mcg by mouth See admin instructions. Takes every 3 days.    [provider]  clopidogrel (PLAVIX) 75 MG tablet Take 1 tablet (75 mg total) by mouth daily. 11/11/22 01/10/23  Noralee Stain, DO  Continuous Blood Gluc Receiver (FREESTYLE LIBRE 2 READER) DEVI 1 Act by Does not apply route daily. 06/17/22   [name], MD  Continuous Blood Gluc Sensor (FREESTYLE LIBRE 2 SENSOR) MISC 1 Act by Does not apply route daily. 06/17/22   [name], MD  Continuous Blood Gluc Sensor (FREESTYLE LIBRE 3 SENSOR) MISC 1 Units by Does not apply route 3 (three) times daily. Place 1 sensor on the skin every 14 days. Use to check glucose  continuously 11/26/22   Hughie Closs, MD  CVS VITAMIN C 500 MG tablet TAKE 1 TABLET (500 MG TOTAL) BY MOUTH DAILY. Patient taking differently: Take 500 mg by mouth daily. 02/03/22   [name], MD  cyclobenzaprine (FLEXERIL) 10 MG tablet TAKE 1 TABLET BY MOUTH TWICE A DAY AS NEEDED FOR MUSCLE SPASM 12/03/22   [name], MD  ELIQUIS 5 MG TABS tablet TAKE 1 TABLET BY MOUTH TWICE A DAY 10/22/22   [name], MD  empagliflozin (JARDIANCE) 10 MG TABS tablet Take 1 tablet (10 mg total) by mouth daily. 11/11/22 01/10/23  Noralee Stain, DO  Ensure Max Protein (ENSURE MAX PROTEIN) LIQD Take 330 mLs (11 oz total) by mouth 2 (two) times daily. 12/12/21   Narda Bonds, MD  fluticasone (FLONASE) 50 MCG/ACT  nasal spray PLACE 2 SPRAYS INTO BOTH NOSTRILS 2 TIMES DAILY. Patient taking differently: Place 2 sprays into both nostrils 2 (two) times daily. 08/04/22   [name], MD  furosemide (LASIX) 40 MG tablet Take 1 tablet (40 mg total) by mouth 2 (two) times daily. 11/26/22   Hughie Closs, MD  gabapentin (NEURONTIN) 100 MG capsule Take 1 capsule (100 mg total) by mouth at bedtime. Patient taking differently: Take 100 mg by mouth daily as needed (For pain). 09/11/21   Kathlen Mody, MD  glucose blood (GLUCOCOM TEST) test strip Use as instructed 11/26/22   Hughie Closs, MD  guaiFENesin-dextromethorphan (ROBITUSSIN DM) 100-10 MG/5ML syrup Take 15 mLs by mouth every 4 (four) hours as needed for cough. 11/23/22   Kathlen Mody, MD  hydrocortisone (ANUSOL-HC) 25 MG suppository Place 1 suppository (25 mg total) rectally 2 (two) times daily. 11/23/22   Kathlen Mody, MD  insulin glargine-yfgn (SEMGLEE) 100 UNIT/ML injection Inject 0.08 mLs (8 Units total) into the skin daily. 11/24/22   Kathlen Mody, MD  Insulin Pen Needle (PEN NEEDLES) 32G X 4 MM MISC Use as needed to administer insulin. E11.9 02/01/22   [name], MD  isosorbide mononitrate (IMDUR) 30 MG 24 hr tablet Take 1 tablet (30 mg total) by mouth daily. 11/12/22   Almon Hercules, MD  KLOR-CON M10 10 MEQ tablet TAKE 1 TABLET BY MOUTH 2 TIMES DAILY. 09/19/22   [name], MD  lamoTRIgine (LAMICTAL) 25 MG tablet TAKE 1 TABLET BY MOUTH EVERYDAY AT BEDTIME Patient taking differently: Take 25 mg by mouth daily. 02/19/22   Ranelle Oyster, MD  Lancets (FREESTYLE) lancets Use as directed up to four times daily 11/26/22   Hughie Closs, MD  LORazepam (ATIVAN) 1 MG tablet Take 1 mg by mouth every 8 (eight) hours as needed for anxiety. 11/28/22   [provider]  metFORMIN (GLUCOPHAGE) 500 MG tablet Take 500 mg by mouth 2 (two) times daily. 12/08/21   [provider]  metoprolol tartrate (LOPRESSOR) 50 MG tablet Take 1 tablet (50 mg total) by  mouth 2 (two) times daily. 11/10/22 01/09/23  Noralee Stain, DO  montelukast (SINGULAIR) 10 MG tablet TAKE 1 TABLET BY MOUTH EVERYDAY AT BEDTIME Patient taking differently: Take 10 mg by mouth at bedtime. 05/04/22   Leslye Peer, MD  Multiple Vitamin (MULTIVITAMIN WITH MINERALS) TABS tablet Take 1 tablet by mouth daily. 12/13/21   Narda Bonds, MD  nitroGLYCERIN (NITROSTAT) 0.4 MG SL tablet PLACE 1 TAB UNDER TONGUE EVERY FOR CHEST PAIN Patient taking differently: Place 0.4 mg under the tongue every 5 (five) minutes as needed for chest pain. 06/30/22  Marykay Lex, MD  oxyCODONE (OXY IR/ROXICODONE) 5 MG immediate release tablet Take 1 tablet (5 mg total) by mouth every 6 (six) hours as needed for severe pain. 12/02/22   [name], MD  pantoprazole (PROTONIX) 40 MG tablet TAKE 1 TABLET BY MOUTH EVERY DAY 08/05/22   [name], MD  predniSONE (DELTASONE) 5 MG tablet Take 5 mg by mouth daily with breakfast.    [provider]  promethazine (PHENERGAN) 25 MG tablet Take 1 tablet (25 mg total) by mouth every 6 (six) hours as needed for nausea or vomiting. 05/20/22   Loyola Mast, MD  ranolazine (RANEXA) 1000 MG SR tablet TAKE 1 TABLET BY MOUTH TWICE A DAY 10/31/22   [name], MD  rosuvastatin (CRESTOR) 20 MG tablet TAKE 1 TABLET BY MOUTH EVERY DAY 07/29/22   Marykay Lex, MD  sertraline (ZOLOFT) 100 MG tablet Take 2 tablets (200 mg total) by mouth daily. 06/16/22   [name], MD  witch hazel-glycerin (TUCKS) pad Apply topically as needed for itching. 11/23/22   Kathlen Mody, MD    Thalia Bloodgood DO  Internal Medicine Resident PGY-3 [place]  Pager: [phone]

## 2022-12-07 NOTE — Progress Notes (Signed)
Rounding Note    Patient Name: Katherine [name] Date of Encounter: 12/07/2022  [facility] Cardiologist: Bryan Lemma, MD   Subjective   No chest pain or dyspnea. Only c/o leg pain  Inpatient Medications    Scheduled Meds:  apixaban  5 mg Oral BID   arformoterol  15 mcg Nebulization BID   azaTHIOprine  125 mg Oral Daily   budesonide  2 mL Nebulization BID   Chlorhexidine Gluconate Cloth  6 each Topical Daily   clopidogrel  75 mg Oral Daily   insulin aspart  0-15 Units Subcutaneous Q4H   insulin glargine-yfgn  8 Units Subcutaneous Daily   lamoTRIgine  25 mg Oral QHS   pantoprazole  40 mg Oral Daily   predniSONE  5 mg Oral Q breakfast   rosuvastatin  20 mg Oral Daily   sertraline  200 mg Oral Daily   Continuous Infusions:  sodium chloride Stopped (11/18/2022 1355)   amiodarone 30 mg/hr (12/07/22 0800)   ceFEPime (MAXIPIME) IV Stopped (12/07/22 0314)   norepinephrine (LEVOPHED) Adult infusion 7.013 mcg/min (12/07/22 0800)   [START ON 12/08/2022] vancomycin     PRN Meds: acetaminophen, docusate sodium, gabapentin, LORazepam, magic mouthwash, ondansetron (ZOFRAN) IV, mouth rinse, oxyCODONE, polyethylene glycol, promethazine   Vital Signs    Vitals:   12/07/22 0645 12/07/22 0700 12/07/22 0741 12/07/22 0800  BP:      Pulse: 88 88  91  Resp: (!) 21 20  17   Temp:   98.4 F (36.9 C)   TempSrc:   Oral   SpO2: (!) 89% (!) 89% 97% 92%  Weight:        Intake/Output Summary (Last 24 hours) at 12/07/2022 0829 Last data filed at 12/07/2022 0800 Gross per 24 hour  Intake 1770.24 ml  Output 175 ml  Net 1595.24 ml      12/07/2022    2:21 AM 11/28/2022    8:01 AM 11/26/2022    6:35 AM  Last 3 Weights  Weight (lbs) 186 lb 8.2 oz 173 lb 193 lb 2 oz  Weight (kg) 84.6 kg 78.472 kg 87.6 kg      Telemetry     Atrial fib, rate 80s - Personally Reviewed  ECG    No AM EKG - Personally Reviewed  Physical Exam   GEN: No acute distress.   Neck: No  JVD Cardiac: Irreg irreg.   Respiratory: Clear to auscultation bilaterally. GI: Soft, nontender, non-distended  MS: No LE edema Neuro:  Nonfocal  Psych: Normal affect   Labs    High Sensitivity Troponin:   Recent Labs  Lab 11/26/22 1326 11/28/22 0910 11/28/22 1125 11/26/2022 1230 12/01/2022 1431  TROPONINIHS 27* 28* 27* 27* 291*     Chemistry Recent Labs  Lab 12/14/2022 1230 12/11/2022 1252 12/11/2022 1655 12/07/22 0113  NA 133* 132* 134* 133*  K 4.9 5.2* 4.9 3.5  CL 96*  --   --  97*  CO2 21*  --   --  22  GLUCOSE 340*  --   --  131*  BUN 22  --   --  24*  CREATININE 1.51*  --   --  1.66*  CALCIUM 9.1  --   --  8.9  MG 1.4*  --   --  2.0  PROT 7.0  --   --   --   ALBUMIN 2.8*  --   --   --   AST 29  --   --   --  ALT 12  --   --   --   ALKPHOS 149*  --   --   --   BILITOT 0.8  --   --   --   GFRNONAA 38*  --   --  34*  ANIONGAP 16*  --   --  14    Lipids No results for input(s): "CHOL", "TRIG", "HDL", "LABVLDL", "LDLCALC", "CHOLHDL" in the last 168 hours.  Hematology Recent Labs  Lab 12/13/2022 1655 12/02/2022 1805 12/07/22 0113  WBC  --  10.6* 11.5*  RBC  --  4.37 3.82*  HGB 11.2* 12.7 11.0*  HCT 33.0* 41.0 35.5*  MCV  --  93.8 92.9  MCH  --  29.1 28.8  MCHC  --  31.0 31.0  RDW  --  18.9* 18.9*  PLT  --  316 284   Thyroid  Recent Labs  Lab 12/02/2022 1230 12/04/2022 1231  TSH  --  1.891  FREET4 1.15*  --     BNP Recent Labs  Lab 11/29/2022 1231  BNP 657.3*    DDimer No results for input(s): "DDIMER" in the last 168 hours.   Radiology    DG Chest Port 1 View  Result Date: 12/07/2022 CLINICAL DATA:  67 year old female acute respiratory failure. EXAM: PORTABLE CHEST 1 VIEW COMPARISON:  Portable chest 11/29/2022 and earlier. FINDINGS: Portable AP semi upright view at 0525 hours. Cardiomegaly. Prior CABG. Stable cardiac size and mediastinal contours. Visualized tracheal air column is within normal limits. Stable lung volumes. Stable lung markings since last  year, no convincing pulmonary edema. No pneumothorax, pleural effusion or confluent opacity. No acute osseous abnormality identified. Paucity of bowel gas the visible abdomen. IMPRESSION: Chronic cardiomegaly, CABG.  No acute cardiopulmonary abnormality. Electronically Signed   By: Odessa Fleming M.D.   On: 12/07/2022 06:19   DG Chest Portable 1 View  Result Date: 11/22/2022 CLINICAL DATA:  Chest pain. EXAM: PORTABLE CHEST 1 VIEW COMPARISON:  Chest radiograph dated 11/28/2022. FINDINGS: The heart is enlarged. Vascular calcifications are seen in the aortic arch. Median sternotomy wires are redemonstrated with multiple wire fractures. Mild diffuse bilateral interstitial opacities are noted. There is mild bibasilar atelectasis. No pleural effusion or pneumothorax. Degenerative changes are seen in the spine. IMPRESSION: 1. Mild diffuse bilateral interstitial opacities, which may represent pulmonary edema or atypical infection superimposed on chronic interstitial lung disease. 2. Cardiomegaly. Electronically Signed   By: Romona Curls M.D.   On: 12/13/2022 12:53    Cardiac Studies     Patient Profile     67 y.o. female with extensive coronary and vascular disease admitted with chest pain, tachycardia and hypotension. She has a history of renal transplantation, PAF on Eliquis, DM, chronic O2 dependent respiratory failure, ischemic cardiomyopathy with chronic systolic CHF, CAD with prior CABG. Last attempt at cardiac cath aborted due to lack of vascular access. She was seen by Dr. Excell Seltzer yesterday and cardiac cath was deferred given her lack of vascular access.   Assessment & Plan    CAD post CABG: No chest pain this am. Minimal troponin elevation with peak HsTroponin of 27, flat. Her current presentation does not seem to be consistent with ACS. Most likely driven by acute on chronic respiratory failure. She is not a candidate for cardiac cath.   Atrial fibrillation: Rate controlled on IV amiodarone. She remains  on Eliquis.   Ischemic cardiomyopathy: Beta blocker on hold in setting of shock.   Nothing to add today from a cardiac perspective.  For questions or updates, please contact [facility] Please consult [email or web address] for contact info under       Signed, Verne Carrow, MD  12/07/2022, 8:29 AM

## 2022-12-07 NOTE — Progress Notes (Incomplete)
eLink Physician-Brief Progress Note Patient Name: Katherine [name] DOB: [birth year] MRN: [id number]   Date of Service  12/06/2022  HPI/Events of Note  67 year old female with a history of chronic respiratory failure and chronic pain in the ICU with hypoxemic respiratory failure, hypotension.   Complains of nausea and pain. Requesting Magic mouthwash.  eICU Interventions  Patient seen.  Home medications reviewed.  Restarted on oxycodone and lorazepam. Phenergan prescribed for nausea.        Carilyn Goodpasture 12/06/2022, 9:03 PM

## 2022-12-07 NOTE — Progress Notes (Addendum)
eLink Physician-Brief Progress Note Patient Name: Katherine [name] DOB: 09/06/1955 MRN: [id number]   Date of Service  12/07/2022  HPI/Events of Note  67 year old female with permanent A-fib, heart failure, renal transplant with CKD, CABG with COPD on 7 L baseline presented with chest pain.  eICU Interventions  Requesting home cyclobenzaprine, ordered.   2342 -central line seems to be using despite additional suture and Surgicel.  Will have the ground team evaluate whether an additional suture is necessary.  Intervention Category Minor Interventions: Routine modifications to care plan (e.g. PRN medications for pain, fever)  Annalee Meyerhoff 12/07/2022, 10:55 PM

## 2022-12-07 NOTE — Procedures (Signed)
Central Venous Catheter Insertion Procedure Note  Katherine [name]  [id number]  1956-05-05  Date:12/07/22  Time:4:40 PM   Provider Performing:Vasili Shanti Agresti   Procedure: Insertion of Non-tunneled Central Venous [phone]) with US guidance ([id number])   Indication(s) Medication administration  Consent Risks of the procedure as well as the alternatives and risks of each were explained to the patient and/or caregiver.  Consent for the procedure was obtained and is signed in the bedside chart  Anesthesia Topical only with 1% lidocaine   Timeout Verified patient identification, verified procedure, site/side was marked, verified correct patient position, special equipment/implants available, medications/allergies/relevant history reviewed, required imaging and test results available.  Sterile Technique Maximal sterile technique including full sterile barrier drape, hand hygiene, sterile gown, sterile gloves, mask, hair covering, sterile ultrasound probe cover (if used).  Procedure Description Area of catheter insertion was cleaned with chlorhexidine and draped in sterile fashion.  With real-time ultrasound guidance a central venous catheter was placed into the right internal jugular vein. Nonpulsatile blood flow and easy flushing noted in all ports.  The catheter was sutured in place and sterile dressing applied.  Complications/Tolerance None; patient tolerated the procedure well. Chest X-ray is ordered to verify placement for internal jugular or subclavian cannulation.   Chest x-ray is not ordered for femoral cannulation.  EBL Minimal  Specimen(s) None

## 2022-12-08 ENCOUNTER — Ambulatory Visit: Payer: Medicare Other | Admitting: Physician Assistant

## 2022-12-08 DIAGNOSIS — Z7189 Other specified counseling: Secondary | ICD-10-CM | POA: Diagnosis not present

## 2022-12-08 DIAGNOSIS — I509 Heart failure, unspecified: Secondary | ICD-10-CM | POA: Diagnosis not present

## 2022-12-08 DIAGNOSIS — I4819 Other persistent atrial fibrillation: Secondary | ICD-10-CM

## 2022-12-08 DIAGNOSIS — R579 Shock, unspecified: Secondary | ICD-10-CM | POA: Diagnosis not present

## 2022-12-08 LAB — BASIC METABOLIC PANEL
Anion gap: 12 (ref 5–15)
Anion gap: 11 (ref 5–15)
BUN: 19 mg/dL (ref 8–23)
BUN: 20 mg/dL (ref 8–23)
CO2: 22 mmol/L (ref 22–32)
CO2: 21 mmol/L — ABNORMAL LOW (ref 22–32)
Calcium: 8.7 mg/dL — ABNORMAL LOW (ref 8.9–10.3)
Calcium: 9.4 mg/dL (ref 8.9–10.3)
Chloride: 97 mmol/L — ABNORMAL LOW (ref 98–111)
Chloride: 101 mmol/L (ref 98–111)
Creatinine, Ser: 1.31 mg/dL — ABNORMAL HIGH (ref 0.44–1.00)
Creatinine, Ser: 1.14 mg/dL — ABNORMAL HIGH (ref 0.44–1.00)
GFR, Estimated: 45 mL/min — ABNORMAL LOW (ref >60)
GFR, Estimated: 53 mL/min — ABNORMAL LOW (ref >60)
Glucose, Bld: 369 mg/dL — ABNORMAL HIGH (ref 70–99)
Glucose, Bld: 242 mg/dL — ABNORMAL HIGH (ref 70–99)
Potassium: 3.1 mmol/L — ABNORMAL LOW (ref 3.5–5.1)
Potassium: 3.9 mmol/L (ref 3.5–5.1)
Sodium: 131 mmol/L — ABNORMAL LOW (ref 135–145)
Sodium: 133 mmol/L — ABNORMAL LOW (ref 135–145)

## 2022-12-08 LAB — POCT I-STAT 7, (LYTES, BLD GAS, ICA,H+H)
Acid-base deficit: 6 mmol/L — ABNORMAL HIGH (ref 0.0–2.0)
Bicarbonate: 20.6 mmol/L (ref 20.0–28.0)
Calcium, Ion: 1.36 mmol/L (ref 1.15–1.40)
HCT: 28 % — ABNORMAL LOW (ref 36.0–46.0)
Hemoglobin: 9.5 g/dL — ABNORMAL LOW (ref 12.0–15.0)
O2 Saturation: 76 %
Patient temperature: 98.2
Potassium: 3.9 mmol/L (ref 3.5–5.1)
Sodium: 136 mmol/L (ref 135–145)
TCO2: 22 mmol/L (ref 22–32)
pCO2 arterial: 42.3 mmHg (ref 32–48)
pH, Arterial: 7.295 — ABNORMAL LOW (ref 7.35–7.45)
pO2, Arterial: 45 mmHg — ABNORMAL LOW (ref 83–108)

## 2022-12-08 LAB — CBC
HCT: 25.9 % — ABNORMAL LOW (ref 36.0–46.0)
Hemoglobin: 8.2 g/dL — ABNORMAL LOW (ref 12.0–15.0)
MCH: 28.9 pg (ref 26.0–34.0)
MCHC: 31.7 g/dL (ref 30.0–36.0)
MCV: 91.2 fL (ref 80.0–100.0)
Platelets: 179 10*3/uL (ref 150–400)
RBC: 2.84 MIL/uL — ABNORMAL LOW (ref 3.87–5.11)
RDW: 18.7 % — ABNORMAL HIGH (ref 11.5–15.5)
WBC: 6.7 10*3/uL (ref 4.0–10.5)
nRBC: 0 % (ref 0.0–0.2)

## 2022-12-08 LAB — GLUCOSE, CAPILLARY
Glucose-Capillary: 339 mg/dL — ABNORMAL HIGH (ref 70–99)
Glucose-Capillary: 312 mg/dL — ABNORMAL HIGH (ref 70–99)
Glucose-Capillary: 270 mg/dL — ABNORMAL HIGH (ref 70–99)
Glucose-Capillary: 161 mg/dL — ABNORMAL HIGH (ref 70–99)
Glucose-Capillary: 149 mg/dL — ABNORMAL HIGH (ref 70–99)

## 2022-12-08 LAB — MAGNESIUM: Magnesium: 1.7 mg/dL (ref 1.7–2.4)

## 2022-12-08 LAB — URINE CULTURE: Culture: NO GROWTH

## 2022-12-08 LAB — DIC (DISSEMINATED INTRAVASCULAR COAGULATION)PANEL
D-Dimer, Quant: 0.87 ug/mL-FEU — ABNORMAL HIGH (ref 0.00–0.50)
Fibrinogen: 507 mg/dL — ABNORMAL HIGH (ref 210–475)
INR: 2.2 — ABNORMAL HIGH (ref 0.8–1.2)
Platelets: 168 10*3/uL (ref 150–400)
Prothrombin Time: 24 seconds — ABNORMAL HIGH (ref 11.4–15.2)
Smear Review: NONE SEEN
aPTT: 36 seconds (ref 24–36)

## 2022-12-08 LAB — VANCOMYCIN, TROUGH: Vancomycin Tr: 7 ug/mL — ABNORMAL LOW (ref 15–20)

## 2022-12-08 LAB — PHOSPHORUS: Phosphorus: 3.4 mg/dL (ref 2.5–4.6)

## 2022-12-08 LAB — GLUCOSE, RANDOM: Glucose, Bld: 486 mg/dL — ABNORMAL HIGH (ref 70–99)

## 2022-12-08 MED ORDER — INSULIN GLARGINE-YFGN 100 UNIT/ML ~~LOC~~ SOLN
5.0000 [IU] | Freq: Every day | SUBCUTANEOUS | Status: DC
  Administered 2022-12-08 – 2022-12-09 (×2): 5 [IU] via SUBCUTANEOUS
  Filled 2022-12-08 (×2): qty 0.05

## 2022-12-08 MED ORDER — HYDROCORTISONE SOD SUC (PF) 100 MG IJ SOLR
50.0000 mg | Freq: Two times a day (BID) | INTRAMUSCULAR | Status: DC
  Administered 2022-12-08 – 2022-12-09 (×2): 50 mg via INTRAVENOUS
  Filled 2022-12-08 (×2): qty 2

## 2022-12-08 MED ORDER — POTASSIUM CHLORIDE CRYS ER 20 MEQ PO TBCR
20.0000 meq | EXTENDED_RELEASE_TABLET | ORAL | Status: AC
  Administered 2022-12-08 (×2): 20 meq via ORAL
  Filled 2022-12-08 (×2): qty 1

## 2022-12-08 MED ORDER — FUROSEMIDE 10 MG/ML IJ SOLN
20.0000 mg | Freq: Once | INTRAMUSCULAR | Status: AC
  Administered 2022-12-08: 20 mg via INTRAVENOUS

## 2022-12-08 MED ORDER — MAGNESIUM SULFATE 2 GM/50ML IV SOLN
2.0000 g | Freq: Once | INTRAVENOUS | Status: AC
  Administered 2022-12-08: 2 g via INTRAVENOUS
  Filled 2022-12-08: qty 50

## 2022-12-08 MED ORDER — ORAL CARE MOUTH RINSE: 15.0000 mL | OROMUCOSAL | Status: DC | PRN

## 2022-12-08 MED ORDER — POTASSIUM CHLORIDE 10 MEQ/50ML IV SOLN
10.0000 meq | INTRAVENOUS | Status: AC
  Administered 2022-12-08 (×4): 10 meq via INTRAVENOUS
  Filled 2022-12-08 (×4): qty 50

## 2022-12-08 MED ORDER — FUROSEMIDE 10 MG/ML IJ SOLN
40.0000 mg | Freq: Once | INTRAMUSCULAR | Status: AC
  Administered 2022-12-08: 40 mg via INTRAVENOUS
  Filled 2022-12-08: qty 4

## 2022-12-08 MED ORDER — INSULIN ASPART 100 UNIT/ML IJ SOLN
0.0000 [IU] | Freq: Every day | INTRAMUSCULAR | Status: DC
  Administered 2022-12-09: 4 [IU] via SUBCUTANEOUS

## 2022-12-08 MED ORDER — POTASSIUM CHLORIDE CRYS ER 20 MEQ PO TBCR
20.0000 meq | EXTENDED_RELEASE_TABLET | Freq: Once | ORAL | Status: AC
  Administered 2022-12-08: 20 meq via ORAL
  Filled 2022-12-08: qty 1

## 2022-12-08 MED ORDER — SALINE SPRAY 0.65 % NA SOLN
1.0000 | NASAL | Status: DC | PRN
  Administered 2022-12-08: 1 via NASAL
  Filled 2022-12-08: qty 44

## 2022-12-08 MED ORDER — LIDOCAINE HCL (PF) 1 % IJ SOLN
INTRAMUSCULAR | Status: AC
  Administered 2022-12-08: 5 mL
  Filled 2022-12-08: qty 5

## 2022-12-08 MED ORDER — INSULIN ASPART 100 UNIT/ML IJ SOLN
0.0000 [IU] | Freq: Three times a day (TID) | INTRAMUSCULAR | Status: DC
  Administered 2022-12-08: 11 [IU] via SUBCUTANEOUS
  Administered 2022-12-08: 4 [IU] via SUBCUTANEOUS
  Administered 2022-12-09: 20 [IU] via SUBCUTANEOUS
  Administered 2022-12-09 (×2): 11 [IU] via SUBCUTANEOUS
  Administered 2022-12-10: 15 [IU] via SUBCUTANEOUS

## 2022-12-08 MED ORDER — MUPIROCIN 2 % EX OINT
TOPICAL_OINTMENT | Freq: Two times a day (BID) | CUTANEOUS | Status: DC
  Administered 2022-12-08 – 2022-12-09 (×3): 1 via NASAL
  Filled 2022-12-08: qty 22

## 2022-12-08 MED ORDER — INSULIN ASPART 100 UNIT/ML IJ SOLN
0.0000 [IU] | INTRAMUSCULAR | Status: DC
  Administered 2022-12-08 (×2): 15 [IU] via SUBCUTANEOUS

## 2022-12-08 MED ORDER — INSULIN ASPART 100 UNIT/ML IJ SOLN
4.0000 [IU] | Freq: Three times a day (TID) | INTRAMUSCULAR | Status: DC
  Administered 2022-12-08 – 2022-12-09 (×5): 4 [IU] via SUBCUTANEOUS

## 2022-12-08 MED ORDER — ORAL CARE MOUTH RINSE
15.0000 mL | OROMUCOSAL | Status: DC
  Administered 2022-12-08 – 2022-12-09 (×4): 15 mL via OROMUCOSAL

## 2022-12-08 MED ORDER — POTASSIUM CHLORIDE CRYS ER 20 MEQ PO TBCR: 40.0000 meq | EXTENDED_RELEASE_TABLET | Freq: Once | ORAL | Status: DC

## 2022-12-08 MED ORDER — FLUTICASONE PROPIONATE 50 MCG/ACT NA SUSP
1.0000 | Freq: Every day | NASAL | Status: DC
  Administered 2022-12-08 – 2022-12-11 (×4): 1 via NASAL
  Filled 2022-12-08: qty 16

## 2022-12-08 MED ORDER — INSULIN ASPART 100 UNIT/ML IJ SOLN
15.0000 [IU] | Freq: Once | INTRAMUSCULAR | Status: AC
  Administered 2022-12-08: 15 [IU] via SUBCUTANEOUS

## 2022-12-08 MED ORDER — IPRATROPIUM-ALBUTEROL 0.5-2.5 (3) MG/3ML IN SOLN
3.0000 mL | RESPIRATORY_TRACT | Status: DC | PRN
  Administered 2022-12-08: 3 mL via RESPIRATORY_TRACT
  Filled 2022-12-08: qty 3

## 2022-12-08 MED ORDER — INSULIN ASPART 100 UNIT/ML IJ SOLN: 4.0000 [IU] | Freq: Three times a day (TID) | INTRAMUSCULAR | Status: DC

## 2022-12-08 MED ORDER — VANCOMYCIN HCL IN DEXTROSE 1-5 GM/200ML-% IV SOLN
1000.0000 mg | INTRAVENOUS | Status: DC
  Administered 2022-12-09: 1000 mg via INTRAVENOUS
  Filled 2022-12-08: qty 200

## 2022-12-08 MED ORDER — FUROSEMIDE 10 MG/ML IJ SOLN: 40.0000 mg | Freq: Once | INTRAMUSCULAR | Status: DC

## 2022-12-08 MED ORDER — FUROSEMIDE 10 MG/ML IJ SOLN
INTRAMUSCULAR | Status: AC
  Administered 2022-12-08: 20 mg via INTRAVENOUS
  Filled 2022-12-08: qty 4

## 2022-12-08 NOTE — Progress Notes (Signed)
Rounding Note    Patient Name: Katherine [name] Date of Encounter: 12/08/2022  [facility] Cardiologist: Bryan Lemma, MD   Subjective   No chest pain.   Inpatient Medications    Scheduled Meds:  apixaban  5 mg Oral BID   arformoterol  15 mcg Nebulization BID   azaTHIOprine  125 mg Oral Daily   budesonide  2 mL Nebulization BID   Chlorhexidine Gluconate Cloth  6 each Topical Daily   clopidogrel  75 mg Oral Daily   hydrocortisone sod succinate (SOLU-CORTEF) inj  100 mg Intravenous Q12H   insulin aspart  0-20 Units Subcutaneous Q4H   lamoTRIgine  25 mg Oral QHS   pantoprazole  40 mg Oral Daily   rosuvastatin  20 mg Oral Daily   sertraline  200 mg Oral Daily   Continuous Infusions:  sodium chloride Stopped (11/21/2022 1355)   albumin human 25 g (12/08/22 0621)   amiodarone 30 mg/hr (12/08/22 0600)   ceFEPime (MAXIPIME) IV Stopped (12/08/22 0322)   norepinephrine (LEVOPHED) Adult infusion Stopped (12/07/22 2244)   potassium chloride 10 mEq (12/08/22 0818)   vancomycin     PRN Meds: acetaminophen, cyclobenzaprine, docusate sodium, gabapentin, LORazepam, magic mouthwash, ondansetron (ZOFRAN) IV, mouth rinse, oxyCODONE, polyethylene glycol, promethazine   Vital Signs    Vitals:   12/08/22 0630 12/08/22 0645 12/08/22 0756 12/08/22 0758  BP:      Pulse: 86 86    Resp: 17 16    Temp:      TempSrc:      SpO2: 97% 97% 95% 100%  Weight:        Intake/Output Summary (Last 24 hours) at 12/08/2022 0820 Last data filed at 12/08/2022 0600 Gross per 24 hour  Intake 1830.93 ml  Output 1550 ml  Net 280.93 ml      12/08/2022    3:40 AM 12/07/2022    2:21 AM 11/28/2022    8:01 AM  Last 3 Weights  Weight (lbs) 189 lb 9.5 oz 186 lb 8.2 oz 173 lb  Weight (kg) 86 kg 84.6 kg 78.472 kg      Telemetry    Atrial fib - Personally Reviewed  ECG    No AM tracing - Personally Reviewed  Physical Exam   General: Well developed, well nourished, NAD  HEENT: OP  clear, mucus membranes moist  SKIN: warm, dry. No rashes. Neuro: No focal deficits  Musculoskeletal: Muscle strength 5/5 all ext  Psychiatric: Mood and affect normal  Neck: No JVD Lungs:scattered rhonci, wheezes Cardiovascular: Irreg irreg. No murmurs, gallops or rubs. Abdomen:Soft. Bowel sounds present. Non-tender.  Extremities: No lower extremity edema.   Labs    High Sensitivity Troponin:   Recent Labs  Lab 11/26/22 1326 11/28/22 0910 11/28/22 1125 12/09/2022 1230 11/21/2022 1431  TROPONINIHS 27* 28* 27* 27* 291*     Chemistry Recent Labs  Lab 11/20/2022 1230 11/19/2022 1252 11/23/2022 1655 12/07/22 0113 12/08/22 0000 12/08/22 0308  NA 133*   < > 134* 133*  --  131*  K 4.9   < > 4.9 3.5  --  3.1*  CL 96*  --   --  97*  --  97*  CO2 21*  --   --  22  --  22  GLUCOSE 340*  --   --  131* 486* 369*  BUN 22  --   --  24*  --  19  CREATININE 1.51*  --   --  1.66*  --  1.31*  CALCIUM 9.1  --   --  8.9  --  8.7*  MG 1.4*  --   --  2.0  --  1.7  PROT 7.0  --   --   --   --   --   ALBUMIN 2.8*  --   --   --   --   --   AST 29  --   --   --   --   --   ALT 12  --   --   --   --   --   ALKPHOS 149*  --   --   --   --   --   BILITOT 0.8  --   --   --   --   --   GFRNONAA 38*  --   --  34*  --  45*  ANIONGAP 16*  --   --  14  --  12   < > = values in this interval not displayed.    Lipids No results for input(s): "CHOL", "TRIG", "HDL", "LABVLDL", "LDLCALC", "CHOLHDL" in the last 168 hours.  Hematology Recent Labs  Lab 12/10/2022 1805 12/07/22 0113 12/08/22 0308  WBC 10.6* 11.5* 6.7  RBC 4.37 3.82* 2.84*  HGB 12.7 11.0* 8.2*  HCT 41.0 35.5* 25.9*  MCV 93.8 92.9 91.2  MCH 29.1 28.8 28.9  MCHC 31.0 31.0 31.7  RDW 18.9* 18.9* 18.7*  PLT 316 284 168  179   Thyroid  Recent Labs  Lab 12/05/2022 1230 12/13/2022 1231  TSH  --  1.891  FREET4 1.15*  --     BNP Recent Labs  Lab 12/10/2022 1231  BNP 657.3*    DDimer  Recent Labs  Lab 12/08/22 0308  DDIMER 0.87*      Radiology    US Renal Transplant w/Doppler  Result Date: 12/07/2022 CLINICAL DATA:  Urinary tract infection EXAM: ULTRASOUND OF RENAL TRANSPLANT WITH RENAL DOPPLER ULTRASOUND TECHNIQUE: Ultrasound examination of the renal transplant was performed with gray-scale, color and duplex doppler evaluation. COMPARISON:  CT pelvis 10/19/2021 FINDINGS: Transplant kidney location: Left pelvis Transplant Kidney: Renal measurements: 11.3 x 5.8 x 5.7 cm = volume: . Normal in size and parenchymal echogenicity. No evidence of mass or hydronephrosis. No peri-transplant fluid collection seen. Color flow in the main renal artery:  Yes Color flow in the main renal vein:  Yes Duplex Doppler Evaluation: Main Renal Artery Velocity: 81 cm/sec Main Renal Artery Resistive Index: 0.8 Venous waveform in main renal vein:  Present Intrarenal resistive index in upper pole:  0.7 (normal 0.6-0.8; equivocal 0.8-0.9; abnormal >= 0.9) Intrarenal resistive index in lower pole: 0.5 (normal 0.6-0.8; equivocal 0.8-0.9; abnormal >= 0.9) Bladder: Normal for degree of bladder distention. Other findings:  None. IMPRESSION: Normal ultrasound evaluation of left pelvic transplant kidney. Electronically Signed   By: Burman Nieves M.D.   On: 12/07/2022 21:58   US RENAL  Result Date: 12/07/2022 CLINICAL DATA:  UTI.  Left pelvic renal transplant. EXAM: RENAL / URINARY TRACT ULTRASOUND COMPLETE COMPARISON:  04/19/2022. FINDINGS: Right Kidney: Not well delineated. Left Kidney: Not well delineated. Bladder: Appears normal for degree of bladder distention. Prevoid bladder volume is 492 mL. Postvoid bladder volume could not be obtained. Other: None. IMPRESSION: 1. Native kidneys are not well delineated on exam. 2. Normal examination of the urinary bladder. Electronically Signed   By: Thornell Sartorius M.D.   On: 12/07/2022 21:52   DG CHEST PORT 1 VIEW  Result Date: 12/07/2022 CLINICAL  DATA:  Central venous catheter placement EXAM: PORTABLE CHEST 1  VIEW COMPARISON:  Previous studies including the examination done earlier today FINDINGS: There is interval placement of right IJ central venous catheter with its tip in superior vena cava. Transverse diameter of heart is increased. There is previous coronary bypass surgery. There is slight prominence of interstitial markings in right lung, possibly scarring. There are no signs of alveolar pulmonary edema or focal pulmonary consolidation. There is no significant pleural effusion or pneumothorax. IMPRESSION: Tip of right IJ central venous catheter is seen in superior vena cava. There is no pneumothorax. Electronically Signed   By: Ernie Avena M.D.   On: 12/07/2022 16:53   [place] EKG SITE RITE  Result Date: 12/07/2022 If [facility] image not attached, placement could not be confirmed due to current cardiac rhythm.  DG Chest Port 1 View  Result Date: 12/07/2022 CLINICAL DATA:  67 year old female acute respiratory failure. EXAM: PORTABLE CHEST 1 VIEW COMPARISON:  Portable chest 11/30/2022 and earlier. FINDINGS: Portable AP semi upright view at 0525 hours. Cardiomegaly. Prior CABG. Stable cardiac size and mediastinal contours. Visualized tracheal air column is within normal limits. Stable lung volumes. Stable lung markings since last year, no convincing pulmonary edema. No pneumothorax, pleural effusion or confluent opacity. No acute osseous abnormality identified. Paucity of bowel gas the visible abdomen. IMPRESSION: Chronic cardiomegaly, CABG.  No acute cardiopulmonary abnormality. Electronically Signed   By: Odessa Fleming M.D.   On: 12/07/2022 06:19   DG Chest Portable 1 View  Result Date: 12/08/2022 CLINICAL DATA:  Chest pain. EXAM: PORTABLE CHEST 1 VIEW COMPARISON:  Chest radiograph dated 11/28/2022. FINDINGS: The heart is enlarged. Vascular calcifications are seen in the aortic arch. Median sternotomy wires are redemonstrated with multiple wire fractures. Mild diffuse bilateral interstitial opacities are  noted. There is mild bibasilar atelectasis. No pleural effusion or pneumothorax. Degenerative changes are seen in the spine. IMPRESSION: 1. Mild diffuse bilateral interstitial opacities, which may represent pulmonary edema or atypical infection superimposed on chronic interstitial lung disease. 2. Cardiomegaly. Electronically Signed   By: Romona Curls M.D.   On: 12/14/2022 12:53    Cardiac Studies     Patient Profile     67 y.o. female with extensive coronary and vascular disease admitted with chest pain, tachycardia and hypotension. She has a history of renal transplantation, PAF on Eliquis, DM, chronic O2 dependent respiratory failure, ischemic cardiomyopathy with chronic systolic CHF, CAD with prior CABG. Last attempt at cardiac cath aborted due to lack of vascular access. She was seen by Dr. Excell Seltzer 12/07/22 and cardiac cath was deferred given her lack of vascular access.   Assessment & Plan    CAD post CABG: No chest pain today. Minimal troponin elevation with peak HsTroponin of 27, flat as described yesterday. Her current presentation does not seem to be consistent with ACS. Most likely driven by acute on chronic respiratory failure. She is not a candidate for cardiac cath.   Atrial fibrillation: Rate controlled on IV amiodarone. Since her Levophed was just stopped, would continue IV amiodarone today and then can stop tomorrow and restart metoprolol if BP stable. Continue Eliquis.    Ischemic cardiomyopathy: Beta blocker on hold in setting of shock.   Cardiology will sign off. Please call with questions.    For questions or updates, please contact [facility] Please consult [email or web address] for contact info under       Signed, Verne Carrow, MD  12/08/2022, 8:20 AM

## 2022-12-08 NOTE — Progress Notes (Signed)
eLink Physician-Brief Progress Note Patient Name: Katherine [name] DOB: April 30, 1956 MRN: [id number]   Date of Service  12/08/2022  HPI/Events of Note  67 year old female that presented with acute hypoxia in the setting of chronic hypoxic respiratory failure and potential urinary tract infection.  COVID has been negative, nasal MRSA positive.  eICU Interventions  Discontinue airborne precautions, maintain contact precautions  Heated high flow order placed for when she comes off of BiPAP     Intervention Category Minor Interventions: Routine modifications to care plan (e.g. PRN medications for pain, fever)  Jed Kutch 12/08/2022, 8:15 PM

## 2022-12-08 NOTE — Progress Notes (Signed)
Pharmacy Antibiotic Note  Katherine [name] is a 66 y.o. female admitted on 12/06/2022 presenting with CP, SOB, concern for pna.  Pharmacy has been consulted for vancomycin and cefepime dosing. Initial cr elevated at 1.6 > improved now 1.3  Plan: Change vancomycin 1gm IV q24h Cefepime 2g IV every 12 hours Monitor renal function, Cx and clinical progression to narrow Vancomycin levels as indicated  Weight: 86 kg (189 lb 9.5 oz)  Temp (24hrs), Avg:98 F (36.7 C), Min:97.1 F (36.2 C), Max:98.8 F (37.1 C)  Recent Labs  Lab 12/06/22 1230 12/06/22 1233 12/06/22 1433 12/06/22 1805 12/07/22 0113 12/08/22 0308 12/08/22 1200  WBC  --   --   --  10.6* 11.5* 6.7  --   CREATININE 1.51*  --   --   --  1.66* 1.31* 1.14*  LATICACIDVEN  --  4.1* 3.3*  --   --   --   --   VANCOTROUGH  --   --   --   --   --   --  7*     Estimated Creatinine Clearance: 49 mL/min (A) (by C-G formula based on SCr of 1.14 mg/dL (H)).    Allergies  Allergen Reactions   Tetracycline Hives    Patient tolerated Doxycycline Dec 2020   Niacin Other (See Comments)    Mouth blisters   Niaspan [Niacin Er] Other (See Comments)    Mouth blisters   Sulfa Antibiotics Nausea Only and Other (See Comments)    "Tears up stomach"   Sulfonamide Derivatives Other (See Comments)    Reaction: per patient "tears her stomach up"   Codeine Nausea And Vomiting   Erythromycin Nausea And Vomiting   Hydromorphone Hcl Nausea And Vomiting   Morphine And Related Nausea And Vomiting   Nalbuphine Nausea And Vomiting    Nubain   Sulfasalazine Nausea Only and Other (See Comments)    per patient "tears her stomach up", "Tears up stomach"   Tape Rash and Other (See Comments)    No "plastic" tape," please----cloth tape only      Leota Sauers Pharm.D. CPP, BCPS Clinical Pharmacist [phone] 12/08/2022 5:36 PM

## 2022-12-08 NOTE — Progress Notes (Signed)
PCCM Brief Note  R CVL site oozing throughout the night despite additional suture placed by day team.  Dressing removed. Active ooze at insertion site. Site cleaned and additional single horizontal mattress suture placed around insertion site with cessation of bleeding.  Site cleaned again and new dressing placed.   Rutherford Guys, PA - [facility] For pager details, please see AMION or use Epic chat  After 1900, please call [facility] for cross coverage needs 12/08/2022, 6:51 AM

## 2022-12-08 NOTE — TOC Initial Note (Addendum)
Transition of Care [facility]) - Initial/Assessment Note    Patient Details  Name: Katherine [name] MRN: [id number] Date of Birth: 07/12/1956  Transition of Care [facility]) CM/SW Contact:    Delilah Shan, LCSWA Phone Number: 12/08/2022, 11:58 AM  Clinical Narrative:                  CSW spoke with patient at bedside.Patient reports she comes from home alone. Patient reports she was recently at short term rehab. Patient reports she has a PCP Dr. Sanda Linger. Patient currently reports her plan is to go home when medically ready for dc. CSW informed patient TOC will continue to follow for PT/OT recommendations. Patient reports her Point of contact is her niece [name]. Patient reports she will transport by [facility] home.CSW will continue to follow and assist with patients dc planning needs.        Patient Goals and CMS Choice            Expected Discharge Plan and Services                                              Prior Living Arrangements/Services                       Activities of Daily Living      Permission Sought/Granted                  Emotional Assessment              Admission diagnosis:  Cardiogenic shock [R57.0] Shock [R57.9] Respiratory distress [R06.03] STEMI (ST elevation myocardial infarction) [I21.3] Acute respiratory failure with hypoxia [J96.01] Acute heart failure, unspecified heart failure type [I50.9] Atrial fibrillation, unspecified type [I48.91] Community acquired pneumonia, unspecified laterality [J18.9] Patient Active Problem List   Diagnosis Date Noted   Shock 12/06/2022   Pressure injury of skin 11/07/2022   Diuretic-induced hypokalemia 06/17/2022   Insulin-requiring or dependent type II diabetes mellitus 06/17/2022   Spondylosis of lumbar region without myelopathy or radiculopathy 12/19/2021   Encounter for palliative care involving management of pain 12/19/2021   COPD with acute exacerbation 12/07/2021   Goals  of care, counseling/discussion 10/03/2021   Class 2 obesity 09/25/2021   Longstanding persistent atrial fibrillation    Chronic combined systolic and diastolic heart failure 08/06/2021   Obesity (BMI 30-39.9) 08/05/2021   Polymorphic ventricular tachycardia    Bradycardia    COPD (chronic obstructive pulmonary disease)    History of renal transplant    Coronary stent restenosis due to scar tissue    Chronic respiratory failure with hypoxia 01/01/2021   Candidal skin infection 08/13/2020   Chronic bronchitis, mucopurulent 08/24/2019   Current chronic use of systemic steroids 07/23/2019   OSA (obstructive sleep apnea)    Severe episode of recurrent major depressive disorder, with psychotic features 02/16/2019   Obstructive chronic bronchitis without exacerbation 11/03/2018   Long term (current) use of antithrombotics/antiplatelets 04/28/2018   LBBB (left bundle branch block) 10/07/2017   Primary osteoarthritis of right knee 08/18/2017   Mild aortic stenosis by prior echocardiogram 01/28/2017   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   Normocytic anemia 10/13/2016   Fibromyalgia 03/30/2016   Other spondylosis with radiculopathy, lumbar region 03/30/2016   Type 2 diabetes mellitus with hyperlipidemia 03/30/2016   Paroxysmal atrial fibrillation (HCC); CHA2DS2VASc score F, HTN, CAD, CVA =  5 06/28/2014   Debility 06/28/2014   CAD S/P percutaneous coronary angioplasty - PCI x 5 to SVG-D1 09/11/2013    Class: Diagnosis of   Essential hypertension    PAD (peripheral artery disease) (HCC) 08/17/2013   CAD (coronary artery disease) of bypass graft 10/08/2012   Stenosis of right carotid artery without infarction 10/08/2012   Dyslipidemia, goal LDL below 70 10/08/2012    Class: Diagnosis of   Mitral annular calcification 10/08/2012   Renal transplant disorder 06/12/2012   Chronic allergic rhinitis 04/29/2011   Extrinsic asthma 09/09/2007   GERD 09/09/2007   Morbid obesity - s/p Lap Band 9/'05  05/07/2004    Class: Diagnosis of   PCP:  [name], MD Pharmacy:   [id number] - [place], [place] - [address] [place] [address] Phone: [phone] Fax: [phone]  [facility] - [place], [place] - [address] [place] [address] Phone: [phone] Fax: [phone]  [facility] [address] [place] [address] Phone: [phone] Fax: [phone]     Social Determinants of Health (SDOH) Social History: SDOH Screenings   Food Insecurity: No Food Insecurity (11/24/2022)  Housing: Low Risk  (11/24/2022)  Transportation Needs: Unmet Transportation Needs (11/24/2022)  Utilities: At Risk (11/24/2022)  Alcohol Screen: Low Risk  (12/10/2021)  Depression (PHQ2-9): Medium Risk (02/02/2022)  Tobacco Use: Medium Risk (11/28/2022)   SDOH Interventions:     Readmission Risk Interventions    11/26/2022   12:36 PM 11/23/2022   12:14 PM 11/09/2022    2:22 PM  Readmission Risk Prevention Plan  Transportation Screening Complete Complete Complete  Medication Review Oceanographer) Complete  Referral to Pharmacy  PCP or Specialist appointment within 3-5 days of discharge Complete    HRI or Home Care Consult Complete  Complete  SW Recovery Care/Counseling Consult   Complete  Palliative Care Screening Not Applicable  Not Applicable  Skilled Nursing Facility Not Applicable Complete Patient Refused

## 2022-12-08 NOTE — Progress Notes (Signed)
[facility] ADULT ICU REPLACEMENT PROTOCOL   The patient does apply for the [facility] Adult ICU Electrolyte Replacment Protocol based on the criteria listed below:   1.Exclusion criteria: TCTS, ECMO, Dialysis, and Myasthenia Gravis patients 2. Is GFR >/= 30 ml/min? Yes.    Patient's GFR today is 45 3. Is SCr </= 2? Yes.   Patient's SCr is 1.31 mg/dL 4. Did SCr increase >/= 0.5 in 24 hours? No. 5.Pt's weight >40kg  Yes.   6. Abnormal electrolyte(s): K+ 3.1, Mag 1.7  7. Electrolytes replaced per protocol 8.  Call MD STAT for K+ </= 2.5, Phos </= 1, or Mag </= 1 Physician:  Larinda Buttery [facility] 12/08/2022 4:24 AM

## 2022-12-08 NOTE — Progress Notes (Signed)
NAME:  Katherine [name], MRN:  [id number], DOB:  06-Jan-1956, LOS: 2 ADMISSION DATE:  11/16/2022, CONSULTATION DATE:  4/21 REFERRING MD:  EDP, CHIEF COMPLAINT:  Chest pain   History of Present Illness:  67 year old woman with chronic respiratory failure on 7 L oxygen at home, HFrEF, known CAD and severe PAD brought in by EMS today for chest discomfort and generalized weakness.  She denies fevers or cough. EMS found her to have heart rate of 140, oxygen saturation 95%, CBG 315, brought in as code STEMI.  Initial EKG showed left bundle branch block, unable to discern whether this was atrial rhythm.  Soft blood pressures were inaccurate, she is a known vasculopath, she was started empirically on Levophed, blood pressure improved from 60s to 160 currently Placed on nonrebreather mask Evaluated by cardiology Labs showed low troponin, lactate 4.1, 3.3 on repeat, no leukocytosis, stable hemoglobin at 10, BNP 725 Chest x-ray showed bilateral interstitial prominence as prior with cardiomegaly Of note CT chest/9/24 shows ILD with patchy areas of groundglass, suspected to be post-COVID and similar to appearance from 07/2021    Pertinent  Medical History  Chronic respiratory failure with hypoxia, on 7 L nasal cannula at home. COPD/ILD -followed by Dr. 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 She is worried she's had a stroke because her arms feel heavy.   Objective   Blood pressure 98/63, pulse 86, temperature (!) 97.1 F (36.2 C), resp. rate 16, weight 86 kg, SpO2 100 %. CVP:  [8 mmHg-24 mmHg] 19 mmHg      Intake/Output Summary (Last  24 hours) at 12/08/2022 0911 Last data filed at 12/08/2022 0600 Gross per 24 hour  Intake 1787.94 ml  Output 1550 ml  Net 237.94 ml    Filed Weights   12/07/22 0221 12/08/22 0340  Weight: 84.6 kg 86 kg   Examination:  General: Adult female appears older than stated age.  HENT: [place]/AT, PERRL, no JVD Lungs: Bibasilar crackles. No distress. On 14L [place] with sats in high 80s.  Cardiovascular: IRIR, no MRG Abdomen: Soft, NT, ND Extremities: No acute deformity or edema.  Neuro: Alert and oriented. Non-focal.   Na 131, K 3.1, Glucose 369, Creatinine 1.31, Mag 1.7, Hgb 8.2 down from [facility] Problem list   N/a  Assessment & Plan:   Shock - cardiogenic vs septic vs adrenal insufficiency Continues to require levophed, arterial line placed yesterday for more accurate BP readings. Will add PICC today and check CVP/coox. She does mention burning with urination two days ago prior to abx, possible urosepsis. Stress dose steroids with her chronic steroid use. Coox 55, CVP 19 -off levophed this morning -coox/CVP pending -continue abx as per below -stress steroids  Leukocytosis Pyuria Concern for UTI Two days PTA with burning with urination. This has resolved since abx. Similar to prior UTI's. UA with pyuria, WBC, and bacteria. Prior cultures sensitive to cefepime. -cefepime/vanc day 3, narrow based on culture results. If febrile can broaden to meropenem -follow urine and blood cultures  Hx of renal transplant Chronic prednsione - Renal function steadily rising, but stable. Now improving. - continue azathioprine. Hold prednisone in  favor of stress steroids. - Wean hydrocortisone - renal US and transplant [place] pending  Chronic hypoxic respiratory failure COPD/ILD -continue supplemental oxygen -monitor volume status closely -continue budesonide, brovana, and yupelri  Atrial fibrillation -continue IV amiodarone to avoid tachycardiomyopathy. Can likely stop in the next  24-48 hours and restart home metoprolol -continue Eliquis 5 mg BID -Repleting electrolytes   CAD history status post CABG with multiple stenting  Ischemic cardiomyopathy -continue medical management -appreciate cardiology assistance -continue plavix and crestor -monitor volume status closely, consider diuresis.   Hx of anxiety/depression -continue zoloft, lamictal, and ativan  Uncontrolled type 2 DM SSI, adjust as needed with stress dose steroids Add semglee  Normocytic anemia No evidence of acute bleed. Likely multifactorial with anemia of chronic disease with CKD and acute illness. If further decrease in hgb can work up with iron studies and reticulocyte count  Palliative care consult pending.  Best Practice (right click and "Reselect all SmartList Selections" daily)   Diet/type: clear liquids DVT prophylaxis: DOAC GI prophylaxis: PPI Lines: PICC Foley:  None Code Status:  DNR Last date of multidisciplinary goals of care discussion - discussed current hospital course and illness with patient.  Labs   CBC: Recent Labs  Lab 12/02/2022 1252 12/03/2022 1655 12/01/2022 1805 12/07/22 0113 12/08/22 0308  WBC  --   --  10.6* 11.5* 6.7  NEUTROABS  --   --  8.5*  --   --   HGB 11.2* 11.2* 12.7 11.0* 8.2*  HCT 33.0* 33.0* 41.0 35.5* 25.9*  MCV  --   --  93.8 92.9 91.2  PLT  --   --  316 284 168  179     Basic Metabolic Panel: Recent Labs  Lab 12/05/2022 1230 11/28/2022 1252 11/23/2022 1655 12/07/22 0113 12/08/22 0000 12/08/22 0308  NA 133* 132* 134* 133*  --  131*  K 4.9 5.2* 4.9 3.5  --  3.1*  CL 96*  --   --  97*  --  97*  CO2 21*  --   --  22  --  22  GLUCOSE 340*  --   --  131* 486* 369*  BUN 22  --   --  24*  --  19  CREATININE 1.51*  --   --  1.66*  --  1.31*  CALCIUM 9.1  --   --  8.9  --  8.7*  MG 1.4*  --   --  2.0  --  1.7  PHOS  --   --   --  4.1  --  3.4    GFR: Estimated Creatinine Clearance: 42.6 mL/min (A) (by C-G formula based on SCr of 1.31 mg/dL  (H)). Recent Labs  Lab 12/02/2022 1233 11/30/2022 1433 11/19/2022 1805 12/07/22 0113 12/08/22 0308  PROCALCITON  --   --  <0.10  --   --   WBC  --   --  10.6* 11.5* 6.7  LATICACIDVEN 4.1* 3.3*  --   --   --      Liver Function Tests: Recent Labs  Lab 11/27/2022 1230  AST 29  ALT 12  ALKPHOS 149*  BILITOT 0.8  PROT 7.0  ALBUMIN 2.8*    Recent Labs  Lab 12/11/2022 1230  LIPASE 18    Recent Labs  Lab 12/04/2022 1233  AMMONIA 38*     ABG    Component Value Date/Time   PHART 7.332 (L) 11/23/2022 1655   PCO2ART 38.8 12/02/2022 1655   PO2ART 55 (L) 12/05/2022 1655  HCO3 20.7 11/23/2022 1655   TCO2 22 11/30/2022 1655   ACIDBASEDEF 5.0 (H) 11/16/2022 1655   O2SAT 55.3 12/07/2022 1658     Coagulation Profile: Recent Labs  Lab 11/20/2022 1230 12/08/22 0308  INR 1.6* 2.2*     Cardiac Enzymes: No results for input(s): "CKTOTAL", "CKMB", "CKMBINDEX", "TROPONINI" in the last 168 hours.  HbA1C: Hgb A1c MFr Bld  Date/Time Value Ref Range Status  11/15/2022 02:29 AM 8.3 (H) 4.8 - 5.6 % Final    Comment:    (NOTE)         Prediabetes: 5.7 - 6.4         Diabetes: >6.4         Glycemic control for adults with diabetes: <7.0   06/16/2022 03:59 PM 8.5 (H) 4.6 - 6.5 % Final    Comment:    Glycemic Control Guidelines for People with Diabetes:Non Diabetic:  <6%Goal of Therapy: <7%Additional Action Suggested:  >8%     CBG: Recent Labs  Lab 12/07/22 1644 12/07/22 1946 12/07/22 2320 12/08/22 0304 12/08/22 0818  GLUCAP 186* 319* 484* 339* 312*      Critical care time:  31 minutes  Joneen Roach, AGACNP-BC [place] [facility]  See Amion for personal pager PCCM on call pager [phone] until 7pm. Please call Elink 7p-7a. [phone]  12/08/2022 9:20 AM

## 2022-12-08 NOTE — Consult Note (Signed)
Consultation Note Date: 12/08/2022   Patient Name: Katherine [name]  DOB: 11-01-1955  MRN: [id number]  Age / Sex: 67 y.o., female  PCP: Katherine Grandchild, MD Referring Physician: Lorin Glass, MD  Reason for Consultation:  end stage heart and lung disease  HPI/Patient Profile: 67 y.o. female  with past medical history of chronic respiratory failure- on 7L oxygen at home, CHF with reduced EF, CAD, PAD, renal transplant, COPD, ILD, a-fib  admitted on 12/06/2022 with chest pain and weakness. She is admitted and being treated for shock- cardiogenic vs adrenal insufficiency, UTI, tachycardia. Palliative medicine consulted for GOC discussion.    Primary Decision Maker PATIENT - if she were unable to make her own decisions she would want for her niece- Katherine [name] to be her Management consultant.   Discussion: Chart reviewed including labs, progress notes, imaging from this and previous encounters.  Palliative medicine familiar with Katherine [name] having had several visits with her last year about this same time.  She has been on hospice previously with [place].  When I saw her and introduced myself as a Palliative provider she immediately said, I don't want hospice, I don't want to talk about hospice.  She revoked hospice in the past due to they wanted her to stop her transplant antirejection medications. She also wants to continue with life prolonging interventions.  She is aware of her chronic health conditions.  Prior to this admission she was living independently at home. Her brother who previously assisted in her care recently died, her niece- Katherine [name] has been assisting with her.  Her primary goals of care are to continue to accept all offered medical interventions. She does have limit set at DNR.  Her hopes are to stabilize this admission so she can return home to her dog, Katherine [name].     SUMMARY OF  RECOMMENDATIONS -Continue full scope care -PMT will continue to follow    Code Status/Advance Care Planning: DNR   Prognosis:   Unable to determine  Discharge Planning: To Be Determined  Primary Diagnoses: Present on Admission: **None**   Review of Systems  Respiratory:  Positive for shortness of breath.     Physical Exam Vitals and nursing note reviewed.  Pulmonary:     Effort: Pulmonary effort is normal.  Neurological:     Mental Status: She is alert and oriented to person, place, and time.     Vital Signs: BP 125/86   Pulse 95   Temp 98.1 F (36.7 C)   Resp 20   Wt 86 kg   SpO2 100%   BMI 35.82 kg/m  Pain Scale: 0-10   Pain Score: 0-No pain   SpO2: SpO2: 100 % O2 Device:SpO2: 100 % O2 Flow Rate: .O2 Flow Rate (L/min): 15 L/min  IO: Intake/output summary:  Intake/Output Summary (Last 24 hours) at 12/08/2022 1522 Last data filed at 12/08/2022 1428 Gross per 24 hour  Intake 2285.65 ml  Output 2100 ml  Net 185.65 ml    LBM: Last BM Date :  (  prior to admission) Baseline Weight: Weight: 84.6 kg Most recent weight: Weight: 86 kg       Thank you for this consult. Palliative medicine will continue to follow and assist as needed.   Greater than 50%  of this time was spent counseling and coordinating care related to the above assessment and plan.  Signed by: Katherine [name], AGNP-C Palliative Medicine    Please contact Palliative Medicine Team phone at [phone] for questions and concerns.  For individual provider: See Loretha Stapler

## 2022-12-09 ENCOUNTER — Other Ambulatory Visit: Payer: Self-pay

## 2022-12-09 ENCOUNTER — Inpatient Hospital Stay (HOSPITAL_COMMUNITY): Payer: Medicare Other

## 2022-12-09 ENCOUNTER — Encounter (HOSPITAL_COMMUNITY): Payer: Self-pay | Admitting: Pulmonary Disease

## 2022-12-09 DIAGNOSIS — J9601 Acute respiratory failure with hypoxia: Secondary | ICD-10-CM | POA: Diagnosis not present

## 2022-12-09 DIAGNOSIS — I5023 Acute on chronic systolic (congestive) heart failure: Secondary | ICD-10-CM

## 2022-12-09 DIAGNOSIS — R609 Edema, unspecified: Secondary | ICD-10-CM

## 2022-12-09 DIAGNOSIS — I509 Heart failure, unspecified: Secondary | ICD-10-CM | POA: Diagnosis not present

## 2022-12-09 DIAGNOSIS — J9621 Acute and chronic respiratory failure with hypoxia: Secondary | ICD-10-CM

## 2022-12-09 DIAGNOSIS — J810 Acute pulmonary edema: Secondary | ICD-10-CM

## 2022-12-09 DIAGNOSIS — R579 Shock, unspecified: Secondary | ICD-10-CM | POA: Diagnosis not present

## 2022-12-09 DIAGNOSIS — J849 Interstitial pulmonary disease, unspecified: Secondary | ICD-10-CM | POA: Diagnosis not present

## 2022-12-09 LAB — BASIC METABOLIC PANEL
Anion gap: 8 (ref 5–15)
BUN: 17 mg/dL (ref 8–23)
CO2: 25 mmol/L (ref 22–32)
Calcium: 9 mg/dL (ref 8.9–10.3)
Chloride: 103 mmol/L (ref 98–111)
Creatinine, Ser: 1.16 mg/dL — ABNORMAL HIGH (ref 0.44–1.00)
GFR, Estimated: 52 mL/min — ABNORMAL LOW (ref >60)
Glucose, Bld: 223 mg/dL — ABNORMAL HIGH (ref 70–99)
Potassium: 3.5 mmol/L (ref 3.5–5.1)
Sodium: 136 mmol/L (ref 135–145)

## 2022-12-09 LAB — CBC
HCT: 26.2 % — ABNORMAL LOW (ref 36.0–46.0)
Hemoglobin: 8 g/dL — ABNORMAL LOW (ref 12.0–15.0)
MCH: 28.8 pg (ref 26.0–34.0)
MCHC: 30.5 g/dL (ref 30.0–36.0)
MCV: 94.2 fL (ref 80.0–100.0)
Platelets: 179 10*3/uL (ref 150–400)
RBC: 2.78 MIL/uL — ABNORMAL LOW (ref 3.87–5.11)
RDW: 18.8 % — ABNORMAL HIGH (ref 11.5–15.5)
WBC: 9.6 10*3/uL (ref 4.0–10.5)
nRBC: 0.2 % (ref 0.0–0.2)

## 2022-12-09 LAB — POCT I-STAT 7, (LYTES, BLD GAS, ICA,H+H)
Acid-Base Excess: 0 mmol/L (ref 0.0–2.0)
Bicarbonate: 25.9 mmol/L (ref 20.0–28.0)
Calcium, Ion: 1.3 mmol/L (ref 1.15–1.40)
HCT: 27 % — ABNORMAL LOW (ref 36.0–46.0)
Hemoglobin: 9.2 g/dL — ABNORMAL LOW (ref 12.0–15.0)
O2 Saturation: 97 %
Patient temperature: 98.9
Potassium: 4.1 mmol/L (ref 3.5–5.1)
Sodium: 137 mmol/L (ref 135–145)
TCO2: 27 mmol/L (ref 22–32)
pCO2 arterial: 45.3 mmHg (ref 32–48)
pH, Arterial: 7.366 (ref 7.35–7.45)
pO2, Arterial: 95 mmHg (ref 83–108)

## 2022-12-09 LAB — GLUCOSE, CAPILLARY
Glucose-Capillary: 257 mg/dL — ABNORMAL HIGH (ref 70–99)
Glucose-Capillary: 279 mg/dL — ABNORMAL HIGH (ref 70–99)
Glucose-Capillary: 451 mg/dL — ABNORMAL HIGH (ref 70–99)
Glucose-Capillary: 438 mg/dL — ABNORMAL HIGH (ref 70–99)
Glucose-Capillary: 323 mg/dL — ABNORMAL HIGH (ref 70–99)

## 2022-12-09 LAB — MAGNESIUM: Magnesium: 2 mg/dL (ref 1.7–2.4)

## 2022-12-09 LAB — ECHOCARDIOGRAM LIMITED BUBBLE STUDY
AV Mean grad: 11 mmHg
AV Peak grad: 21.2 mmHg
Ao pk vel: 2.3 m/s

## 2022-12-09 MED ORDER — MORPHINE SULFATE (PF) 2 MG/ML IV SOLN
1.0000 mg | INTRAVENOUS | Status: DC | PRN
  Administered 2022-12-09: 1 mg via INTRAVENOUS
  Filled 2022-12-09: qty 1

## 2022-12-09 MED ORDER — AMIODARONE HCL 200 MG PO TABS
200.0000 mg | ORAL_TABLET | Freq: Every day | ORAL | Status: DC
  Administered 2022-12-09 – 2022-12-11 (×3): 200 mg via ORAL
  Filled 2022-12-09 (×3): qty 1

## 2022-12-09 MED ORDER — MORPHINE SULFATE (PF) 2 MG/ML IV SOLN
2.0000 mg | INTRAVENOUS | Status: DC | PRN
  Administered 2022-12-09 – 2022-12-10 (×8): 2 mg via INTRAVENOUS
  Filled 2022-12-09 (×9): qty 1

## 2022-12-09 MED ORDER — ORAL CARE MOUTH RINSE: 15.0000 mL | OROMUCOSAL | Status: DC | PRN

## 2022-12-09 MED ORDER — POTASSIUM CHLORIDE CRYS ER 20 MEQ PO TBCR
40.0000 meq | EXTENDED_RELEASE_TABLET | Freq: Four times a day (QID) | ORAL | Status: AC
  Administered 2022-12-09 (×3): 40 meq via ORAL
  Filled 2022-12-09 (×3): qty 2

## 2022-12-09 MED ORDER — INSULIN ASPART 100 UNIT/ML IJ SOLN
6.0000 [IU] | Freq: Three times a day (TID) | INTRAMUSCULAR | Status: DC
  Administered 2022-12-09 – 2022-12-10 (×2): 6 [IU] via SUBCUTANEOUS

## 2022-12-09 MED ORDER — INSULIN ASPART 100 UNIT/ML IJ SOLN: 6.0000 [IU] | Freq: Three times a day (TID) | INTRAMUSCULAR | Status: DC

## 2022-12-09 MED ORDER — METHYLPREDNISOLONE SODIUM SUCC 125 MG IJ SOLR
80.0000 mg | Freq: Two times a day (BID) | INTRAMUSCULAR | Status: DC
  Administered 2022-12-09 (×2): 80 mg via INTRAVENOUS
  Filled 2022-12-09 (×3): qty 2

## 2022-12-09 MED ORDER — LORAZEPAM 1 MG PO TABS
1.0000 mg | ORAL_TABLET | ORAL | Status: DC | PRN
  Administered 2022-12-09 – 2022-12-10 (×4): 1 mg via ORAL
  Filled 2022-12-09 (×4): qty 1

## 2022-12-09 MED ORDER — POTASSIUM CHLORIDE CRYS ER 20 MEQ PO TBCR
40.0000 meq | EXTENDED_RELEASE_TABLET | Freq: Once | ORAL | Status: AC
  Administered 2022-12-09: 40 meq via ORAL
  Filled 2022-12-09: qty 2

## 2022-12-09 MED ORDER — INSULIN GLARGINE-YFGN 100 UNIT/ML ~~LOC~~ SOLN
8.0000 [IU] | Freq: Two times a day (BID) | SUBCUTANEOUS | Status: DC
  Administered 2022-12-09: 8 [IU] via SUBCUTANEOUS
  Filled 2022-12-09 (×3): qty 0.08

## 2022-12-09 MED ORDER — FUROSEMIDE 10 MG/ML IJ SOLN
40.0000 mg | Freq: Four times a day (QID) | INTRAMUSCULAR | Status: AC
  Administered 2022-12-09 (×2): 40 mg via INTRAVENOUS
  Filled 2022-12-09 (×2): qty 4

## 2022-12-09 NOTE — Progress Notes (Addendum)
Daily Progress Note   Patient Name: Katherine [name]       Date: 12/09/2022 DOB: 06-27-1956  Age: 67 y.o. MRN#: [id number] Attending Physician: Lorin Glass, MD Primary Care Physician: [name], MD Admit Date: 11/21/2022  Reason for Consultation/Follow-up: Establishing goals of care  Patient Profile/HPI:   67 y.o. female  with past medical history of chronic respiratory failure- on 7L oxygen at home, CHF with reduced EF, CAD, PAD, renal transplant, COPD, ILD, a-fib  admitted on 12/05/2022 with chest pain and weakness. She is admitted and being treated for shock- cardiogenic vs adrenal insufficiency, UTI, tachycardia, interstitial lung disease. Palliative medicine consulted for GOC discussion.    Subjective: Chart reviewed including labs, progress notes, imaging from this and previous encounters.  Patient is requiring HF O2 at 45L and 100% FiO2.  Called by RN to meet with patient after patient expressed her worries and her desire for comfort measures if she does not improve to RN. Met with Almira Coaster and her niece [name].  Almira Coaster understands that her lungs are not recovering. Gina worries about dying at home or with hospice and noone to care for her. If she were at end of life she would want to die in the hospital.  We discussed current interventions that are prolonging her life- IV fluids, antibiotics, steroids, HF oxygen.  Almira Coaster was clear that if she was not showing improvement by tomorrow, then she would want to transition to full comfort measures only and be supported through her dying process. Discussed transition to comfort measures only which includes stopping IV fluids, antibiotics, labs and providing symptom management for SOB, anxiety, nausea, vomiting, and other symptoms of dying.  We  discussed she would benefit from palliative sedation whenever she was ready to begin to titrate off of HF oxygen. Specifically I would recommend sedation with propofol. We discussed the risks and benefits of using propofol for palliative sedation and she and her niece are in agreement.  She has great fear of suffering at end of life- specifically struggling to breathe.  Before she dies she hopes to have a visit with her dog, and to speak with several family members. Her niece [name] is going to call family and tell them to come visit [name] ASAP. If she worsens tonight- then she does not want her care  escalated, she wants to transition to full comfort measures only.   She expressed that she feels she is in transition already. She speaks to seeing family members who have died before her, and seeing pictures of Jesus when she closes her eyes.    Review of Systems  Respiratory:  Positive for shortness of breath.      Physical Exam Vitals and nursing note reviewed.  Constitutional:      General: She is not in acute distress.    Appearance: She is ill-appearing.  Pulmonary:     Comments: On HHFNC, increased effort Neurological:     Mental Status: She is alert.             Vital Signs: BP 111/82   Pulse (!) 105   Temp 98.3 F (36.8 C) (Axillary)   Resp (!) 21   Wt 84 kg   SpO2 92%   BMI 34.99 kg/m  SpO2: SpO2: 92 % O2 Device: O2 Device: High Flow Nasal Cannula O2 Flow Rate: O2 Flow Rate (L/min): 45 L/min  Intake/output summary:  Intake/Output Summary (Last 24 hours) at 12/09/2022 1631 Last data filed at 12/09/2022 1400 Gross per 24 hour  Intake 1564.18 ml  Output 2551 ml  Net -986.82 ml   LBM: Last BM Date :  (PTA) Baseline Weight: Weight: 84.6 kg Most recent weight: Weight: 84 kg       Palliative Assessment/Data: PPS: 20%      Patient Active Problem List   Diagnosis Date Noted   Acute pulmonary edema 12/09/2022   Acute on chronic HFrEF (heart failure with reduced  ejection fraction) 12/09/2022   Acute on chronic hypoxic respiratory failure 12/09/2022   Shock 11/20/2022   Pressure injury of skin 11/07/2022   Diuretic-induced hypokalemia 06/17/2022   Insulin-requiring or dependent type II diabetes mellitus 06/17/2022   Spondylosis of lumbar region without myelopathy or radiculopathy 12/19/2021   Encounter for palliative care involving management of pain 12/19/2021   COPD with acute exacerbation 12/07/2021   Goals of care, counseling/discussion 10/03/2021   Class 2 obesity 09/25/2021   Longstanding persistent atrial fibrillation    Chronic combined systolic and diastolic heart failure 08/06/2021   Obesity (BMI 30-39.9) 08/05/2021   Polymorphic ventricular tachycardia    Bradycardia    COPD (chronic obstructive pulmonary disease)    History of renal transplant    Coronary stent restenosis due to scar tissue    Chronic respiratory failure with hypoxia 01/01/2021   Candidal skin infection 08/13/2020   Chronic bronchitis, mucopurulent 08/24/2019   Current chronic use of systemic steroids 07/23/2019   OSA (obstructive sleep apnea)    Severe episode of recurrent major depressive disorder, with psychotic features 02/16/2019   Obstructive chronic bronchitis without exacerbation 11/03/2018   Long term (current) use of antithrombotics/antiplatelets 04/28/2018   LBBB (left bundle branch block) 10/07/2017   Primary osteoarthritis of right knee 08/18/2017   Mild aortic stenosis by prior echocardiogram 01/28/2017   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   Normocytic anemia 10/13/2016   Fibromyalgia 03/30/2016   Other spondylosis with radiculopathy, lumbar region 03/30/2016   Type 2 diabetes mellitus with hyperlipidemia 03/30/2016   Paroxysmal atrial fibrillation (HCC); CHA2DS2VASc score F, HTN, CAD, CVA = 5 06/28/2014   Debility 06/28/2014   CAD S/P percutaneous coronary angioplasty - PCI x 5 to SVG-D1 09/11/2013   Essential hypertension    PAD (peripheral artery  disease) (HCC) 08/17/2013   CAD (coronary artery disease) of bypass graft 10/08/2012   Stenosis of right carotid  artery without infarction 10/08/2012   Dyslipidemia, goal LDL below 70 10/08/2012   Mitral annular calcification 10/08/2012   Renal transplant disorder 06/12/2012   Chronic allergic rhinitis 04/29/2011   Extrinsic asthma 09/09/2007   GERD 09/09/2007   Morbid obesity - s/p Lap Band 9/'05 05/07/2004    Palliative Care Assessment & Plan    Assessment/Recommendations/Plan  Continue current plan of care If worsens overnight do not escalate care- make transition to full comfort only If no improvement tomorrow- then transition to full comfort measures only- recommend Palliative sedation with propofol prior to titrating off of HF oxygen Morphine  IV q15 min prn for now for SOB Lorazepam  po q4hr prn for anxiety   Code Status: DNR  Prognosis:  Hours - Days if she transitions to full comfort measures only  Discharge Planning: To Be Determined anticipate hospital death if transitions to full comfort measures only  Care plan was discussed with patient and care team.   Thank you for allowing the Palliative Medicine Team to assist in the care of this patient.  Total time:   90 mins  Greater than 50%  of this time was spent counseling and coordinating care related to the above assessment and plan.  Ocie Bob, AGNP-C Palliative Medicine   Please contact Palliative Medicine Team phone at [phone] for questions and concerns.

## 2022-12-09 NOTE — Inpatient Diabetes Management (Signed)
Inpatient Diabetes Program Recommendations  AACE/ADA: New Consensus Statement on Inpatient Glycemic Control (2015)  Target Ranges:  Prepandial:   less than 140 mg/dL      Peak postprandial:   less than 180 mg/dL (1-2 hours)      Critically ill patients:  140 - 180 mg/dL   Lab Results  Component Value Date   GLUCAP 257 (H) 12/09/2022   HGBA1C 8.3 (H) 11/15/2022    Review of Glycemic Control  Latest Reference Range & Units 12/08/22 08:18 12/08/22 11:20 12/08/22 16:01 12/08/22 21:12 12/09/22 06:22  Glucose-Capillary 70 - 99 mg/dL 161 (H) 096 (H) 045 (H) 149 (H) 257 (H)  (H): Data is abnormally high Diabetes history: Type 2 DM Outpatient Diabetes medications: Jardiance 10 mg QD, Basaglar 12 units QD, Metformin 500 mg BID Current orders for Inpatient glycemic control: Semglee 5 units QD, Novolog 4 units TID, Novolog 0-20 units TID & HS Solumedrol 80 mg BID  Inpatient Diabetes Program Recommendations:    Consider increasing Novolog 6 units TID (Assuming patient is consuming >50% of meals).   Thanks, Lujean Rave, MSN, RNC-OB Diabetes Coordinator [phone] (8a-5p)

## 2022-12-09 NOTE — Progress Notes (Signed)
Lower extremity venous bilateral study completed.   Please see CV Proc for preliminary results.   Yader Criger, RDMS, RVT  

## 2022-12-09 NOTE — Progress Notes (Addendum)
NAME:  Katherine [name], MRN:  [id number], DOB:  06/04/1956, LOS: 3 ADMISSION DATE:  11/21/2022, CONSULTATION DATE:  4/21 REFERRING MD:  EDP, CHIEF COMPLAINT:  Chest pain   History of Present Illness:  67 year old woman with chronic respiratory failure on 7 L oxygen at home, HFrEF, known CAD and severe PAD brought in by EMS today for chest discomfort and generalized weakness.  She denies fevers or cough. EMS found her to have heart rate of 140, oxygen saturation 95%, CBG 315, brought in as code STEMI.  Initial EKG showed left bundle branch block, unable to discern whether this was atrial rhythm.  Soft blood pressures were inaccurate, she is a known vasculopath, she was started empirically on Levophed, blood pressure improved from 60s to 160 currently Placed on nonrebreather mask Evaluated by cardiology Labs showed low troponin, lactate 4.1, 3.3 on repeat, no leukocytosis, stable hemoglobin at 10, BNP 725 Chest x-ray showed bilateral interstitial prominence as prior with cardiomegaly Of note CT chest/9/24 shows ILD with patchy areas of groundglass, suspected to be post-COVID and similar to appearance from 07/2021    Pertinent  Medical History  Chronic respiratory failure with hypoxia, on 7 L nasal cannula at home. COPD/ILD -followed by Dr. 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   Objective   Blood pressure 102/66, pulse 80, temperature 98.3 F (36.8 C), temperature source Axillary, resp. rate 16, weight 84 kg, SpO2 94 %. CVP:  [9 mmHg-20 mmHg] 13 mmHg  Vent  Mode: BIPAP FiO2 (%):  [50 %-100 %] 100 % Set Rate:  [15 bmp] 15 bmp PEEP:  [6 cmH20] 6 cmH20   Intake/Output Summary (Last 24 hours) at 12/09/2022 4098 Last data filed at 12/09/2022 0600 Gross per 24 hour  Intake 1541.05 ml  Output 3701 ml  Net -2159.95 ml    Filed Weights   12/07/22 0221 12/08/22 0340 12/09/22 0500  Weight: 84.6 kg 86 kg 84 kg   Examination:  General: Adult female appears older than stated age.  HENT: [place]/AT, PERRL Lungs: Bibasilar crackles Cardiovascular: IRIR, no MRG Abdomen: Soft, NT, ND Extremities: No acute deformity or edema.  Neuro: Alert and oriented. Non-focal.    Resolved Hospital Problem list   Shock  Assessment & Plan:   Acute on chronic hypoxic respiratory failure: on 7 L at home COPD: possible exacerbation ILD Pulmonary edema acute Cannot rule out PNA -HHFNC 90% and 45LPM as well as NRB -BiPAP PRN as tolerated.  -Diuresis -Continue budesonide, brovana, and Yupelri -Has been on stress steroids. Prednisone 5 chronically. Change to solumedrol.  -Check CXR  Leukocytosis Pyuria Concern for UTI Two days PTA with burning with urination. This has resolved since abx. Similar to prior UTI's. UA with pyuria, WBC, and bacteria. Prior cultures sensitive to cefepime. - cefepime/vanc day 4 - follow blood cultures - UC negative  Hx of renal transplant Chronic prednsione - Renal function steadily rising, but stable. Now improving. - continue azathioprine. Hold prednisone in favor of stress steroids. - renal US and transplant [place] pending  Atrial fibrillation -Transition to PO amio -continue Eliquis 5 mg BID -  Repleting electrolytes   Acute on chronic HFrEF CAD history status post CABG with multiple stenting  Ischemic cardiomyopathy -continue medical management -cardiology signed off -continue plavix and crestor -monitor volume status closely, consider diuresis.   Hx of anxiety/depression -continue zoloft, lamictal, and  ativan  Uncontrolled type 2 DM SSI, adjust as needed with stress dose steroids Add semglee  Normocytic anemia No evidence of acute bleed. Likely multifactorial with anemia of chronic disease with CKD and acute illness. If further decrease in hgb can work up with iron studies and reticulocyte count  GOC: met with palliative care yesterday. DNR, DNI, but full aggressive care otherwise. Not ready for hospice as they have directed her to stop her anti-rejection meds in the past.   Will try to get CVL and art line out today.    Best Practice (right click and "Reselect all SmartList Selections" daily)   Diet/type: Regular consistency (see orders) DVT prophylaxis: DOAC GI prophylaxis: PPI Lines: PICC Foley:  None Code Status:  DNR Last date of multidisciplinary goals of care discussion - discussed current hospital course and illness with patient.  Labs   CBC: Recent Labs  Lab 11/25/2022 1805 12/07/22 0113 12/08/22 0308 12/08/22 1257 12/09/22 0415  WBC 10.6* 11.5* 6.7  --  9.6  NEUTROABS 8.5*  --   --   --   --   HGB 12.7 11.0* 8.2* 9.5* 8.0*  HCT 41.0 35.5* 25.9* 28.0* 26.2*  MCV 93.8 92.9 91.2  --  94.2  PLT 316 284 168  179  --  179     Basic Metabolic Panel: Recent Labs  Lab 11/19/2022 1230 11/19/2022 1252 12/07/22 0113 12/08/22 0000 12/08/22 0308 12/08/22 1200 12/08/22 1257 12/09/22 0415  NA 133*   < > 133*  --  131* 133* 136 136  K 4.9   < > 3.5  --  3.1* 3.9 3.9 3.5  CL 96*  --  97*  --  97* 101  --  103  CO2 21*  --  22  --  22 21*  --  25  GLUCOSE 340*  --  131* 486* 369* 242*  --  223*  BUN 22  --  24*  --  19 20  --  17  CREATININE 1.51*  --  1.66*  --  1.31* 1.14*  --  1.16*  CALCIUM 9.1  --  8.9  --  8.7* 9.4  --  9.0  MG 1.4*  --  2.0  --  1.7  --   --  2.0  PHOS  --   --  4.1  --  3.4  --   --   --    < > = values in this interval not displayed.    GFR: Estimated Creatinine Clearance: 47.6 mL/min (A) (by C-G formula based on SCr of 1.16 mg/dL  (H)). Recent Labs  Lab 11/18/2022 1233 12/09/2022 1433 11/24/2022 1805 12/07/22 0113 12/08/22 0308 12/09/22 0415  PROCALCITON  --   --  <0.10  --   --   --   WBC  --   --  10.6* 11.5* 6.7 9.6  LATICACIDVEN 4.1* 3.3*  --   --   --   --      Liver Function Tests: Recent Labs  Lab 11/22/2022 1230  AST 29  ALT 12  ALKPHOS 149*  BILITOT 0.8  PROT 7.0  ALBUMIN 2.8*    Recent Labs  Lab 12/15/2022 1230  LIPASE 18    Recent Labs  Lab 12/10/2022 1233  AMMONIA 38*     ABG    Component Value Date/Time   PHART 7.295 (L) 12/08/2022 1257   PCO2ART 42.3 12/08/2022 1257   PO2ART 45 (L) 12/08/2022 1257   HCO3 20.6 12/08/2022 1257   TCO2 22 12/08/2022 1257   ACIDBASEDEF 6.0 (H) 12/08/2022 1257   O2SAT 76 12/08/2022 1257     Coagulation Profile: Recent Labs  Lab 11/23/2022 1230 12/08/22 0308  INR 1.6* 2.2*     Cardiac Enzymes: No results for input(s): "CKTOTAL", "CKMB", "CKMBINDEX", "TROPONINI" in the last 168 hours.  HbA1C: Hgb A1c MFr Bld  Date/Time Value Ref Range Status  11/15/2022 02:29 AM 8.3 (H) 4.8 - 5.6 % Final    Comment:    (NOTE)         Prediabetes: 5.7 - 6.4         Diabetes: >6.4         Glycemic control for adults with diabetes: <7.0   06/16/2022 03:59 PM 8.5 (H) 4.6 - 6.5 % Final    Comment:    Glycemic Control Guidelines for People with Diabetes:Non Diabetic:  <6%Goal of Therapy: <7%Additional Action Suggested:  >8%     CBG: Recent Labs  Lab 12/08/22 0818 12/08/22 1120 12/08/22 1601 12/08/22 2112 12/09/22 0622  GLUCAP 312* 270* 161* 149* 257*      Critical care time:  33 minutes  Joneen Roach, AGACNP-BC [facility]  See Amion for personal pager PCCM on call pager [phone] until 7pm. Please call Elink 7p-7a. [phone]  12/09/2022 9:03 AM

## 2022-12-09 NOTE — Progress Notes (Signed)
[facility] ADULT ICU REPLACEMENT PROTOCOL   The patient does apply for the [facility] Adult ICU Electrolyte Replacment Protocol based on the criteria listed below:   1.Exclusion criteria: TCTS, ECMO, Dialysis, and Myasthenia Gravis patients 2. Is GFR >/= 30 ml/min? Yes.    Patient's GFR today is 52 3. Is SCr </= 2? Yes.   Patient's SCr is 1.16 mg/dL 4. Did SCr increase >/= 0.5 in 24 hours? No. 5.Pt's weight >40kg  Yes.   6. Abnormal electrolyte(s): K+ 3.5  7. Electrolytes replaced per protocol 8.  Call MD STAT for K+ </= 2.5, Phos </= 1, or Mag </= 1 Physician:  Larinda Buttery [facility] 12/09/2022 5:14 AM

## 2022-12-10 ENCOUNTER — Other Ambulatory Visit: Payer: Self-pay | Admitting: Internal Medicine

## 2022-12-10 ENCOUNTER — Other Ambulatory Visit: Payer: Self-pay | Admitting: Cardiology

## 2022-12-10 ENCOUNTER — Other Ambulatory Visit (HOSPITAL_COMMUNITY): Payer: Medicare Other

## 2022-12-10 DIAGNOSIS — Z515 Encounter for palliative care: Secondary | ICD-10-CM

## 2022-12-10 DIAGNOSIS — F333 Major depressive disorder, recurrent, severe with psychotic symptoms: Secondary | ICD-10-CM

## 2022-12-10 DIAGNOSIS — J9621 Acute and chronic respiratory failure with hypoxia: Secondary | ICD-10-CM

## 2022-12-10 DIAGNOSIS — J810 Acute pulmonary edema: Secondary | ICD-10-CM

## 2022-12-10 DIAGNOSIS — R079 Chest pain, unspecified: Secondary | ICD-10-CM

## 2022-12-10 DIAGNOSIS — Z7189 Other specified counseling: Secondary | ICD-10-CM | POA: Diagnosis not present

## 2022-12-10 DIAGNOSIS — I5023 Acute on chronic systolic (congestive) heart failure: Secondary | ICD-10-CM

## 2022-12-10 DIAGNOSIS — R579 Shock, unspecified: Secondary | ICD-10-CM | POA: Diagnosis not present

## 2022-12-10 LAB — BASIC METABOLIC PANEL
Anion gap: 11 (ref 5–15)
BUN: 21 mg/dL (ref 8–23)
CO2: 24 mmol/L (ref 22–32)
Calcium: 9.3 mg/dL (ref 8.9–10.3)
Chloride: 99 mmol/L (ref 98–111)
Creatinine, Ser: 1.23 mg/dL — ABNORMAL HIGH (ref 0.44–1.00)
GFR, Estimated: 49 mL/min — ABNORMAL LOW (ref >60)
Glucose, Bld: 304 mg/dL — ABNORMAL HIGH (ref 70–99)
Potassium: 4.6 mmol/L (ref 3.5–5.1)
Sodium: 134 mmol/L — ABNORMAL LOW (ref 135–145)

## 2022-12-10 LAB — CBC
HCT: 27.3 % — ABNORMAL LOW (ref 36.0–46.0)
Hemoglobin: 8.5 g/dL — ABNORMAL LOW (ref 12.0–15.0)
MCH: 28.7 pg (ref 26.0–34.0)
MCHC: 31.1 g/dL (ref 30.0–36.0)
MCV: 92.2 fL (ref 80.0–100.0)
Platelets: 194 10*3/uL (ref 150–400)
RBC: 2.96 MIL/uL — ABNORMAL LOW (ref 3.87–5.11)
RDW: 18.8 % — ABNORMAL HIGH (ref 11.5–15.5)
WBC: 12 10*3/uL — ABNORMAL HIGH (ref 4.0–10.5)
nRBC: 0.3 % — ABNORMAL HIGH (ref 0.0–0.2)

## 2022-12-10 LAB — TROPONIN I (HIGH SENSITIVITY)
Troponin I (High Sensitivity): 1382 ng/L (ref <18)
Troponin I (High Sensitivity): 1518 ng/L (ref <18)

## 2022-12-10 LAB — MAGNESIUM: Magnesium: 1.7 mg/dL (ref 1.7–2.4)

## 2022-12-10 LAB — GLUCOSE, CAPILLARY: Glucose-Capillary: 313 mg/dL — ABNORMAL HIGH (ref 70–99)

## 2022-12-10 MED ORDER — LORAZEPAM 2 MG/ML IJ SOLN
2.0000 mg | INTRAMUSCULAR | Status: DC | PRN
  Administered 2022-12-12: 2 mg via INTRAVENOUS
  Filled 2022-12-10: qty 1

## 2022-12-10 MED ORDER — ASPIRIN 81 MG PO TBEC
81.0000 mg | DELAYED_RELEASE_TABLET | Freq: Every day | ORAL | Status: DC
  Administered 2022-12-10 – 2022-12-11 (×2): 81 mg via ORAL
  Filled 2022-12-10 (×3): qty 1

## 2022-12-10 MED ORDER — NITROGLYCERIN 0.4 MG SL SUBL
0.4000 mg | SUBLINGUAL_TABLET | SUBLINGUAL | Status: DC | PRN
  Administered 2022-12-10 (×2): 0.4 mg via SUBLINGUAL
  Filled 2022-12-10 (×2): qty 1

## 2022-12-10 MED ORDER — PREDNISONE 5 MG PO TABS
5.0000 mg | ORAL_TABLET | Freq: Every day | ORAL | Status: DC
  Administered 2022-12-11: 5 mg via ORAL
  Filled 2022-12-10 (×2): qty 1

## 2022-12-10 MED ORDER — LORAZEPAM 1 MG PO TABS
1.0000 mg | ORAL_TABLET | ORAL | Status: DC | PRN
  Administered 2022-12-10 – 2022-12-11 (×2): 1 mg via ORAL
  Filled 2022-12-10 (×2): qty 1

## 2022-12-10 MED ORDER — OXYCODONE HCL 5 MG PO TABS
10.0000 mg | ORAL_TABLET | ORAL | Status: DC | PRN
  Administered 2022-12-10 – 2022-12-11 (×2): 10 mg via ORAL
  Filled 2022-12-10 (×2): qty 2

## 2022-12-10 MED ORDER — MORPHINE SULFATE (PF) 2 MG/ML IV SOLN: 2.0000 mg | INTRAVENOUS | Status: DC | PRN

## 2022-12-10 MED ORDER — MORPHINE SULFATE (PF) 4 MG/ML IV SOLN
4.0000 mg | INTRAVENOUS | Status: DC | PRN
  Administered 2022-12-10 – 2022-12-11 (×2): 4 mg via INTRAVENOUS
  Filled 2022-12-10 (×2): qty 1

## 2022-12-10 MED ORDER — METOPROLOL TARTRATE 5 MG/5ML IV SOLN
2.5000 mg | INTRAVENOUS | Status: DC | PRN
  Administered 2022-12-10: 2.5 mg via INTRAVENOUS
  Filled 2022-12-10: qty 5

## 2022-12-10 MED ORDER — MAGIC MOUTHWASH: 10.0000 mL | Freq: Three times a day (TID) | ORAL | Status: DC | PRN

## 2022-12-10 MED ORDER — MORPHINE SULFATE (PF) 4 MG/ML IV SOLN: 6.0000 mg | INTRAVENOUS | Status: DC | PRN

## 2022-12-10 MED ORDER — LORAZEPAM 2 MG/ML IJ SOLN: 1.0000 mg | INTRAMUSCULAR | Status: DC | PRN

## 2022-12-10 NOTE — Progress Notes (Signed)
Daily Progress Note   Patient Name: Katherine [name]       Date: 12/10/2022 DOB: November 20, 1955  Age: 67 y.o. MRN#: [id number] Attending Physician: Lorin Glass, MD Primary Care Physician: [name], MD Admit Date: 12/06/2022  Reason for Consultation/Follow-up: Establishing goals of care  Patient Profile/HPI:   66 y.o. female  with past medical history of chronic respiratory failure- on 7L oxygen at home, CHF with reduced EF, CAD, PAD, renal transplant, COPD, ILD, a-fib  admitted on 12/06/2022 with chest pain and weakness. She is admitted and being treated for shock- cardiogenic vs adrenal insufficiency, UTI, tachycardia, interstitial lung disease. Palliative medicine consulted for GOC discussion.    Subjective: Chart reviewed including labs, progress notes, imaging from this and previous encounters.  Patient is requiring HF O2 at 45L and 100% FiO2.  Niece, [name] is at bedside. She brought Toby for visit.  Mckell is feeling comfortable- enjoying visits from her family and her dog.  She understands plan to be to focus on comfort. No more labs, IV med except meds for comfort. She wishes to continue her oral medications for now.  We again discussed using palliative sedation with propofol prior to discontinuing her heated high flow oxygen. She is in agreement with this plan. She is uncertain as to when she wants to proceed with sedation and weaning of oxygen. Likely to be a few days as she is enjoying visits and currently comfortable.    Review of Systems  Respiratory:  Positive for shortness of breath.      Physical Exam Vitals and nursing note reviewed.  Constitutional:      General: She is not in acute distress.    Appearance: She is ill-appearing.  Pulmonary:     Comments: On  HHFNC Neurological:     Mental Status: She is alert.             Vital Signs: BP 129/84   Pulse 97   Temp 98.1 F (36.7 C)   Resp (!) 23   Ht  (1.549 m)   Wt 82.6 kg   SpO2 92%   BMI 34.41 kg/m  SpO2: SpO2: 92 % O2 Device: O2 Device: High Flow Nasal Cannula O2 Flow Rate: O2 Flow Rate (L/min): 45 L/min  Intake/output summary:  Intake/Output Summary (Last 24 hours)  at 12/10/2022 1113 Last data filed at 12/10/2022 0700 Gross per 24 hour  Intake 1482.42 ml  Output 1500 ml  Net -17.58 ml    LBM: Last BM Date :  (PTA) Baseline Weight: Weight: 84.6 kg Most recent weight: Weight: 82.6 kg       Palliative Assessment/Data: PPS: 20%      Patient Active Problem List   Diagnosis Date Noted   Acute pulmonary edema 12/09/2022   Acute on chronic HFrEF (heart failure with reduced ejection fraction) 12/09/2022   Acute on chronic hypoxic respiratory failure 12/09/2022   Shock 12/06/2022   Pressure injury of skin 11/07/2022   Diuretic-induced hypokalemia 06/17/2022   Insulin-requiring or dependent type II diabetes mellitus 06/17/2022   Spondylosis of lumbar region without myelopathy or radiculopathy 12/19/2021   Encounter for palliative care involving management of pain 12/19/2021   COPD with acute exacerbation 12/07/2021   Goals of care, counseling/discussion 10/03/2021   Class 2 obesity 09/25/2021   Longstanding persistent atrial fibrillation    Chronic combined systolic and diastolic heart failure 08/06/2021   Obesity (BMI 30-39.9) 08/05/2021   Polymorphic ventricular tachycardia    Bradycardia    COPD (chronic obstructive pulmonary disease)    History of renal transplant    Coronary stent restenosis due to scar tissue    Chronic respiratory failure with hypoxia 01/01/2021   Candidal skin infection 08/13/2020   Chronic bronchitis, mucopurulent 08/24/2019   Current chronic use of systemic steroids 07/23/2019   OSA (obstructive sleep apnea)    Severe episode of  recurrent major depressive disorder, with psychotic features 02/16/2019   Obstructive chronic bronchitis without exacerbation 11/03/2018   Long term (current) use of antithrombotics/antiplatelets 04/28/2018   LBBB (left bundle branch block) 10/07/2017   Primary osteoarthritis of right knee 08/18/2017   Mild aortic stenosis by prior echocardiogram 01/28/2017   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   Normocytic anemia 10/13/2016   Fibromyalgia 03/30/2016   Other spondylosis with radiculopathy, lumbar region 03/30/2016   Type 2 diabetes mellitus with hyperlipidemia 03/30/2016   Paroxysmal atrial fibrillation (HCC); CHA2DS2VASc score F, HTN, CAD, CVA = 5 06/28/2014   Debility 06/28/2014   CAD S/P percutaneous coronary angioplasty - PCI x 5 to SVG-D1 09/11/2013   Essential hypertension    PAD (peripheral artery disease) (HCC) 08/17/2013   CAD (coronary artery disease) of bypass graft 10/08/2012   Stenosis of right carotid artery without infarction 10/08/2012   Dyslipidemia, goal LDL below 70 10/08/2012   Mitral annular calcification 10/08/2012   Renal transplant disorder 06/12/2012   Chronic allergic rhinitis 04/29/2011   Extrinsic asthma 09/09/2007   GERD 09/09/2007   Morbid obesity - s/p Lap Band 9/'05 05/07/2004    Palliative Care Assessment & Plan    Assessment/Recommendations/Plan  Continue current plan of care If worsens do not escalate care Recommend using palliative sedation with propofol order set when she is ready- likely to be a few days Comfort measures only Morphine  IV q15 min prn for now for SOB Lorazepam  po q4hr prn for anxiety   Code Status: DNR  Prognosis:  Hours - Days when transitions to full comfort measures only and begins weaning off HHHF oxygen  Discharge Planning: To Be Determined anticipate hospital death if transitions to full comfort measures only  Care plan was discussed with patient and care team.   Thank you for allowing the Palliative  Medicine Team to assist in the care of this patient.  Greater than 50%  of this time was spent  counseling and coordinating care related to the above assessment and plan.  Mariana Kaufman, AGNP-C Palliative Medicine   Please contact Palliative Medicine Team phone at [phone] for questions and concerns.

## 2022-12-10 NOTE — Progress Notes (Signed)
eLink Physician-Brief Progress Note Patient Name: Katherine [name] DOB: Apr 01, 1956 MRN: [id number]   Date of Service  12/10/2022  HPI/Events of Note  Troponin 1382.  eICU Interventions  AM echocardiogram ordered, cardiology consulted.        Katherine [name] 12/10/2022, 3:02 AM

## 2022-12-10 NOTE — Progress Notes (Signed)
NAME:  Katherine [name], MRN:  [id number], DOB:  [birth year], LOS: 4 ADMISSION DATE:  11/16/2022, CONSULTATION DATE:  4/21 REFERRING MD:  EDP, CHIEF COMPLAINT:  Chest pain   History of Present Illness:  67 year old woman with chronic respiratory failure on 7 L oxygen at home, HFrEF, known CAD and severe PAD brought in by EMS today for chest discomfort and generalized weakness.  She denies fevers or cough.  EMS found her to have heart rate of 140, oxygen saturation 95%, CBG 315, brought in as code STEMI.  Initial EKG showed left bundle branch block, unable to discern whether this was atrial rhythm.  Soft blood pressures were inaccurate, she is a known vasculopath, she was started empirically on Levophed, blood pressure improved from 60s to 160 currently, Placed on nonrebreather mask. Evaluated by cardiology. Labs showed low troponin, lactate 4.1, 3.3 on repeat, no leukocytosis, stable hemoglobin at 10, BNP 725. Chest x-ray showed bilateral interstitial prominence as prior with cardiomegaly. 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 COPD/ILD -followed by Dr. Delton Coombes HFrEF, EF 35 to 40% on last echo Anterior MI - 1994 s/p PTCA, CABGx3 1995 Persistent AF - on eliquis, tikosyn Renal transplantation 1991, now CKD 3b Gastric bypass  Significant Hospital Events: Including procedures, antibiotic start and stop dates in addition to other pertinent events   4/21 vancomycine and cefepime started 4/23 off pressors, but worsening oxygen requirements  Interim History / Subjective:  4/24 patient meet with palliative care. 4/25 patient wishing to transition to comfort care.   Objective   Blood pressure 129/84, pulse 97, temperature 98.1 F (36.7 C), resp. rate (!) 23, height 5\' 1"  (1.549 m), weight 82.6 kg, SpO2 92  %. CVP:  [12 mmHg-26 mmHg] 23 mmHg  FiO2 (%):  [94 %-95 %] 94 %   Intake/Output Summary (Last 24 hours) at 12/10/2022 7829 Last data filed at 12/10/2022 0700 Gross per 24 hour  Intake 1995.69 ml  Output 1500 ml  Net 495.69 ml   Filed Weights   12/08/22 0340 12/09/22 0500 12/10/22 0415  Weight: 86 kg 84 kg 82.6 kg   Examination:  General: Chronically ill appearing adult female, lying in bed, no distress  HENT: [place]/AT, PERRL Lungs: no use of accessory muscles  Cardiovascular: Irregular, HR 93  Abdomen: Soft, obese, non-distended  Extremities: -edema  Neuro: Alert, oriented, follows commands    Resolved Hospital Problem list   Shock  Assessment & Plan:   Acute on chronic hypoxic respiratory failure: on 7 L at home COPD: possible exacerbation ILD Pulmonary edema acute Cannot rule out PNA Leukocytosis Pyuria Concern for UTI Hx of renal transplant Chronic prednsione Atrial fibrillation Acute on chronic HFrEF CAD history status post CABG with multiple stenting  Ischemic cardiomyopathy Hx of anxiety/depression Uncontrolled type 2 DM Normocytic anemia Plan - Patient wishing to transition to comfort care, requesting to continue current oral medication regimen at this time  - Once patient has meet with all family will d/c heated high flow and place on 2L [place]  - PRN Morphine, Ativan in place - Continue PRN Oxycodone, Flexeril, Neurontin  - Continue Amiodarone, Eliquis, Imuran, Plavix, Crestor, Pred, PPI >> for now, patient understands if she becomes confused and unable to take orals we will d/c    Best Practice (right click and "Reselect  all SmartList Selections" daily)   Diet/type: Regular diet  Code Status:  DNR   Labs   CBC: Recent Labs  Lab 11/21/2022 1805 12/07/22 0113 12/08/22 0308 12/08/22 1257 12/09/22 0415 12/09/22 1118 12/10/22 0303  WBC 10.6* 11.5* 6.7  --  9.6  --  12.0*  NEUTROABS 8.5*  --   --   --   --   --   --   HGB 12.7 11.0* 8.2* 9.5* 8.0*  9.2* 8.5*  HCT 41.0 35.5* 25.9* 28.0* 26.2* 27.0* 27.3*  MCV 93.8 92.9 91.2  --  94.2  --  92.2  PLT 316 284 168  179  --  179  --  194    Basic Metabolic Panel: Recent Labs  Lab 11/17/2022 1230 12/01/2022 1252 12/07/22 0113 12/08/22 0000 12/08/22 0308 12/08/22 1200 12/08/22 1257 12/09/22 0415 12/09/22 1118 12/10/22 0303  NA 133*   < > 133*  --  131* 133* 136 136 137 134*  K 4.9   < > 3.5  --  3.1* 3.9 3.9 3.5 4.1 4.6  CL 96*  --  97*  --  97* 101  --  103  --  99  CO2 21*  --  22  --  22 21*  --  25  --  24  GLUCOSE 340*  --  131* 486* 369* 242*  --  223*  --  304*  BUN 22  --  24*  --  19 20  --  17  --  21  CREATININE 1.51*  --  1.66*  --  1.31* 1.14*  --  1.16*  --  1.23*  CALCIUM 9.1  --  8.9  --  8.7* 9.4  --  9.0  --  9.3  MG 1.4*  --  2.0  --  1.7  --   --  2.0  --  1.7  PHOS  --   --  4.1  --  3.4  --   --   --   --   --    < > = values in this interval not displayed.   GFR: Estimated Creatinine Clearance: 44.4 mL/min (A) (by C-G formula based on SCr of 1.23 mg/dL (H)). Recent Labs  Lab 11/26/2022 1233 12/04/2022 1433 11/24/2022 1805 12/10/2022 1805 12/07/22 0113 12/08/22 0308 12/09/22 0415 12/10/22 0303  PROCALCITON  --   --  <0.10  --   --   --   --   --   WBC  --   --  10.6*   < > 11.5* 6.7 9.6 12.0*  LATICACIDVEN 4.1* 3.3*  --   --   --   --   --   --    < > = values in this interval not displayed.    Liver Function Tests: Recent Labs  Lab 12/10/2022 1230  AST 29  ALT 12  ALKPHOS 149*  BILITOT 0.8  PROT 7.0  ALBUMIN 2.8*   Recent Labs  Lab 11/24/2022 1230  LIPASE 18   Recent Labs  Lab 11/22/2022 1233  AMMONIA 38*    ABG    Component Value Date/Time   PHART 7.366 12/09/2022 1118   PCO2ART 45.3 12/09/2022 1118   PO2ART 95 12/09/2022 1118   HCO3 25.9 12/09/2022 1118   TCO2 27 12/09/2022 1118   ACIDBASEDEF 6.0 (H) 12/08/2022 1257   O2SAT 97 12/09/2022 1118     Coagulation Profile: Recent Labs  Lab 11/21/2022 1230 12/08/22 0308  INR 1.6*  2.2*  Cardiac Enzymes: No results for input(s): "CKTOTAL", "CKMB", "CKMBINDEX", "TROPONINI" in the last 168 hours.  HbA1C: Hgb A1c MFr Bld  Date/Time Value Ref Range Status  11/15/2022 02:29 AM 8.3 (H) 4.8 - 5.6 % Final    Comment:    (NOTE)         Prediabetes: 5.7 - 6.4         Diabetes: >6.4         Glycemic control for adults with diabetes: <7.0   06/16/2022 03:59 PM 8.5 (H) 4.6 - 6.5 % Final    Comment:    Glycemic Control Guidelines for People with Diabetes:Non Diabetic:  <6%Goal of Therapy: <7%Additional Action Suggested:  >8%     CBG: Recent Labs  Lab 12/09/22 1106 12/09/22 1630 12/09/22 1740 12/09/22 2215 12/10/22 0634  GLUCAP 279* 451* 438* 323* 313*   CRITICAL CARE Performed by: Tobey Grim   Total critical care time: 52 minutes  Critical care time was exclusive of separately billable procedures and treating other patients.  Critical care was necessary to treat or prevent imminent or life-threatening deterioration.  Critical care was time spent personally by me on the following activities: development of treatment plan with patient and/or surrogate as well as nursing, discussions with consultants, evaluation of patient's response to treatment, examination of patient, obtaining history from patient or surrogate, ordering and performing treatments and interventions, ordering and review of laboratory studies, ordering and review of radiographic studies, pulse oximetry and re-evaluation of patient's condition.

## 2022-12-10 NOTE — Progress Notes (Signed)
eLink Physician-Brief Progress Note Patient Name: Katherine [name] DOB: [birth year] MRN: [id number]   Date of Service  12/10/2022  HPI/Events of Note  EKG shows atrial fibrillation with left bundle branch block pattern, and RVR, unchanged from baseline from 12/06/22.  eICU Interventions  PRN iv Metoprolol ordered PRN RVR or hypertension.        [name] [name] 12/10/2022, 1:17 AM

## 2022-12-10 NOTE — Progress Notes (Signed)
eLink Physician-Brief Progress Note Patient Name: Katherine [name] DOB: 06-19-1956 MRN: [id number]   Date of Service  12/10/2022  HPI/Events of Note  Patient with retro-sternal chest pain. There is a reproducible component which is distinct qualitatively from a non-reproducible component.  eICU Interventions  Stat 12 lead EKG and Troponin, Morphine iv PRN, Aspirin 81 mg Po daily (patient is on Elquis),  SL NTG 0.4 mg PO Q 5 minutes x 3.        [name] 12/10/2022, 12:58 AM

## 2022-12-10 NOTE — Inpatient Diabetes Management (Signed)
Inpatient Diabetes Program Recommendations  AACE/ADA: New Consensus Statement on Inpatient Glycemic Control (2015)  Target Ranges:  Prepandial:   less than 140 mg/dL      Peak postprandial:   less than 180 mg/dL (1-2 hours)      Critically ill patients:  140 - 180 mg/dL   Lab Results  Component Value Date   GLUCAP 313 (H) 12/10/2022   HGBA1C 8.3 (H) 11/15/2022    Review of Glycemic Control  Latest Reference Range & Units 12/09/22 17:40 12/09/22 22:15 12/10/22 06:34  Glucose-Capillary 70 - 99 mg/dL 401 (H) 027 (H) 253 (H)  (H): Data is abnormally high Diabetes history: Type 2 DM Outpatient Diabetes medications: Jardiance 10 mg QD, Basaglar 12 units QD, Metformin 500 mg BID Current orders for Inpatient glycemic control: Semglee 5 units QD, Novolog 6 units TID, Novolog 0-20 units TID & HS Solumedrol 80 mg BID   Inpatient Diabetes Program Recommendations:    If continues to align with goals of care and steroids to continue: Consider: - Further increasing Novolog 10 units TID (Assuming patient is consuming >50% of meals) -Further increasing Semglee 12 units BID  Thanks, Lujean Rave, MSN, RNC-OB Diabetes Coordinator [phone] (8a-5p)

## 2022-12-10 NOTE — Progress Notes (Signed)
Cardiology Note:   See full consult note from 4:11 am  Pt has no good options for cardiac interventions.  Chest pain last night in setting of atrial fib with RVR Continue amiodarone for rate control while planning in place for transition to comfort care.  I would cancel the echo that was ordered last night.   Verne Carrow, MD, [facility] 12/10/2022 6:55 AM

## 2022-12-10 NOTE — Consult Note (Signed)
Cardiology Consultation   Patient ID: Katherine [name] MRN: [id number]; DOB: [birth year]  Admit date: 11/21/2022 Date of Consult: 12/10/2022  PCP:  Katherine Grandchild, MD   [facility] Providers Cardiologist:  Bryan Lemma, MD        Patient Profile:   Katherine [name] is a 67 y.o. female  with extensive coronary and vascular disease admitted with chest pain, tachycardia and hypotension. She has a history of renal transplantation, PAF on Eliquis, DM, chronic O2 dependent respiratory failure, ischemic cardiomyopathy with chronic systolic CHF, CAD with prior CABG. Last attempt at cardiac cath aborted due to lack of vascular access. She was seen by Dr. Excell Seltzer 12/07/22 and cardiac cath was deferred given her lack of vascular access who is being seen 12/10/2022 for the evaluation of chest pain and elevated troponin at the request of Dr. Warrick Parisian.  History of Present Illness:   Katherine [name] was last seen by our rounding team on 12/08/2022 for which she was not having chest pain and minimal troponin elevation.  She was not felt to be a candidate for cardiac cath at that time than her vascular access issues.  Since that time she has had persistent hypoxic respiratory failure requiring Optiflow for which palliative care has been following and patient was expressing worries to transition to DNR and comfort measures if her clinical course not improved.  Further recommendation if she was to worsen overnight that would not escalate care and make transition to full comfort measures only.  If no improvement by tomorrow she would transition to comfort measures as well.  Guidelines for morphine and Ativan use provided.  She developed retrosternal chest pain around 1 AM this morning with EKG demonstrating A-fib with RVR and known left bundle branch block.  No Sgarbossa criteria met.  Troponin resulted at 1300.  TTE ordered and cardiology was consulted.   Past Medical History:  Diagnosis Date   Anemia     Anxiety    Bilateral carotid artery stenosis    Carotid duplex 11/979: 1-39% LICA, 60-79% RICA, >50% RECA, f/u 1 yr suggested   CAD (coronary artery disease) of bypass graft 5/01; 3/'02, 8/'03, 10/'04; 1/15   PCI x 5 to SVG-D1    CAD in native artery 07/1993   3 Vessel Disease (LAD-D1 & RCA) -- CABG (Dx in setting of inferior STEMI-PTCA of RCA)   COPD mixed type    Followed by Dr. Delton Coombes "pulmonologist said no COPD"   Depression with anxiety    Diabetes mellitus type 2 in obese    Diarrhea    started after cholecystectomy and mass removed from intestine   Dyslipidemia, goal LDL below 70    08/2012: TC 137, TG 200, HDL 32!, LDL 45; on statin (followed by Dr.Deterding)   ESRD (end stage renal disease) 1991   s/p Cadaveric Renal Transplant [facility] - Dr. 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  Had having having lots of nausea and vomiting with poor p.o. intake.  Thought to have QT prolongation with multiple medications  involved and hypomagnesemia, hypokalemia.  Tikosyn discontinued along with Zoloft and Phenergan.   Hypertension associated with diabetes    Mild aortic stenosis by prior echocardiogram 07/2019   Echo:  Mild aortic stenosis (gradients: Mean 14.3 mmHg -peak 24.9 mmHg).   Morbid obesity    MRSA (methicillin resistant staph aureus) culture positive    OSA (obstructive sleep apnea)    no longer on CPAP or home O2, states she doesn't need now after lap band   PAD (peripheral artery disease) 08/2013   LEA Dopplers to be read by Dr. Kirke Corin   PAF (paroxysmal atrial fibrillation) 06/2014    Noted on CardioNet Monitor  - --> rhythm control with Tikosyn (Dr. Johney Frame); converted from warfarin to apixaban for anticoagulation.   Pneumonia    Recurrent boils    Bilateral Groin   Rheumatoid arthritis    Per Patient Report; associated with OA   S/p cadaver renal transplant 1991   [facility]    Past Surgical History:  Procedure Laterality Date   ABDOMINAL AORTOGRAM N/A 04/21/2018   Procedure: ABDOMINAL AORTOGRAM;  Surgeon: Marykay Lex, MD;  Location: [facility] INVASIVE CV LAB;  Service: Cardiovascular;  Laterality: N/A;   CATHETER REMOVAL     CHOLECYSTECTOMY N/A 10/29/2014   Procedure: LAPAROSCOPIC CHOLECYSTECTOMY WITH INTRAOPERATIVE CHOLANGIOGRAM;  Surgeon: Glenna Fellows, MD;  Location: [facility] ORS;  Service: General;  Laterality: N/A;   CORONARY ANGIOPLASTY  1994   x5   CORONARY ARTERY BYPASS GRAFT  1995   LIMA-LAD, SVG-RPDA, SVG-D1   CORONARY PRESSURE/FFR STUDY N/A 02/20/2021   Procedure: INTRAVASCULAR PRESSURE WIRE/FFR STUDY;  Surgeon: Marykay Lex, MD;  Location: [facility] INVASIVE CV LAB;  Service: Cardiovascular;  Laterality: N/A;   CORONARY STENT INTERVENTION N/A 02/20/2021   Procedure: PERCUTANEOUS CORONARY STENT INTERVENTION;  Surgeon: Marykay Lex, MD;  Location: [facility] INVASIVE CV LAB; ostLCx 60% (Neg RFR 0.96);; SVG- D2 recurrent 90% ISR & 95% native D2 after graft-> DES PCI of 95% anastomotic D2 lesion (Onyx Frontier 2.25 mm x 12 mm => 2.75 mm @ overlap, 2.5 distal.);PTCA of ISR in body of graft. ->  2.5 mm scoring balloon & post-dil w/ 2.75 mm Pollock balloon   ESOPHAGOGASTRODUODENOSCOPY N/A 10/15/2016   Procedure: ESOPHAGOGASTRODUODENOSCOPY (EGD);  Surgeon: Charlott Rakes, MD;  Location: [facility] ENDOSCOPY;  Service: Endoscopy;  Laterality: N/A;   I & D EXTREMITY Right 01/29/2018   Procedure: IRRIGATION AND DEBRIDEMENT THUMB;  Surgeon: Knute Neu, MD;  Location: [facility] OR;  Service: Plastics;  Laterality: Right;   INCISE AND DRAIN ABCESS     KIDNEY TRANSPLANT  1991   KNEE ARTHROSCOPY  WITH LATERAL MENISECTOMY Left 12/03/2017   Procedure: LEFT KNEE ARTHROSCOPY WITH LATERAL MENISECTOMY;  Surgeon: Frederico Hamman, MD;  Location: [facility] OR;  Service: Orthopedics;  Laterality: Left;   LAPAROSCOPIC GASTRIC BANDING  04/2004; 10/'09, 2/'10   Port Replacement x 2   LEFT HEART CATH AND CORONARY ANGIOGRAPHY N/A 02/20/2021   Procedure: LEFT HEART CATH AND CORONARY ANGIOGRAPHY;  Surgeon: Marykay Lex, MD;  Location: [facility] INVASIVE CV LAB;  Service: Cardiovascular;  Laterality: N/A;   LEFT HEART CATH AND CORS/GRAFTS ANGIOGRAPHY N/A 04/21/2018   Procedure: LEFT HEART CATH AND CORS/GRAFTS ANGIOGRAPHY;  Surgeon: Marykay Lex, MD;  Location: [facility] INVASIVE CV LAB;  Ost-Prox LAD 50% - proxLAD (pre & post D1) 100% CTO. Cx - patent, small OM1 (stable ~ ostial OM1 90%, too small for PCI) & 2 small LPL; Ost-distal RCA 100% CTO.  LIMA-LAD (not injected); SVG-dRCA patent, SVG-D1 -  insertion stent ~20% ISR - Severe R CFA disease w/ focal Sub TO   LEFT HEART CATH AND CORS/GRAFTS ANGIOGRAPHY  5/'01, 3/'02, 8/'03, 10/'04; 1/'15   08/22/2013: LAD & RCA 100%; LIMA-LAD & SVG-rPDA patent; Cx-- OM1 60%, OM2 ostial ~50%; SVG-D1 - 80% mid, 50% distal ISR --PCI   LEFT HEART CATH AND CORS/GRAFTS ANGIOGRAPHY N/A 01/31/2021   Procedure: LEFT HEART CATH AND CORS/GRAFTS ANGIOGRAPHY;  Surgeon: Dolores Patty, MD;  Location: [facility] INVASIVE CV LAB;;   LEFT HEART CATH AND CORS/GRAFTS ANGIOGRAPHY N/A 09/05/2021   Procedure: LEFT HEART CATH AND CORS/GRAFTS ANGIOGRAPHY;  Surgeon: Marykay Lex, MD;  Location: [facility] INVASIVE CV LAB;  Service: Cardiovascular;  Laterality: N/A;   LEFT HEART CATH AND CORS/GRAFTS ANGIOGRAPHY N/A 08/21/2021   Procedure: LEFT HEART CATH AND CORS/GRAFTS ANGIOGRAPHY;  Surgeon: Lyn Records, MD;  Location: [facility] INVASIVE CV LAB;  Service: Cardiovascular;  Laterality: N/A;   LEFT HEART CATHETERIZATION WITH CORONARY/GRAFT ANGIOGRAM N/A 08/23/2013   Procedure: LEFT HEART CATHETERIZATION WITH Isabel Caprice;  Surgeon: Iran Ouch, MD;  Location: [facility] CATH LAB;  Service: Cardiovascular;  Laterality: N/A;   Lower Extremity Arterial Dopplers  08/2013   ABI: R 0.96, L 1.04   MULTIPLE TOOTH EXTRACTIONS  age 51   NM MYOVIEW LTD  03/2016   EF 62%. LOW RISK. C/W prior MI - no Ischemia. Apical hypokinesis.   PERCUTANEOUS CORONARY STENT INTERVENTION (PCI-S)  5/'01, 3/'02, 8/'03, 10/'04;   '01 - S660 BMS 2.5 x 9 - dSVG-D1 into D1; '02- post-stent stenosis - 2.5 x 8 Pixel BMS; '8\03: ISR/Thrombosis into native D1 - AngioJet, 2.5 x 13 Pixel; '04 - ISR 95% - covered stented area with Taxus DES 2.5 mm x 20 (2.88)   PERCUTANEOUS CORONARY STENT INTERVENTION (PCI-S)  08/23/2013   Procedure: PERCUTANEOUS CORONARY STENT INTERVENTION (PCI-S);  Surgeon: Iran Ouch, MD;  Location: [facility] CATH LAB;  Service: Cardiovascular;;mid SVG-D1 80%; distal stent ~50% ISR; Promus Prermier DES 2.75 mm xc 20 mm (2.8 mm)   PORT-A-CATH REMOVAL     kidney   TRANSTHORACIC ECHOCARDIOGRAM  07/2019   a) 07/2019: EF 55 to 60%.  No LVH.  Paradoxical septal WM-s/p CABG.  GRII DD.  Nl RV size and fxn.  Mild bilateral atrial dilation.  Mod MAC.  Trace MR.  Mild AS (gradients: Mean 14.3 mmHg -peak 24.9 mmHg).; B) 06/2020: EF 40 to 45%.  Moderate concentric LVH.  GRII DD.  Elevated LAP.  Mod HK mid Apical Ant-AntSept wall & mild Apical Dyskinesis.  Mod LA dilation.  Mild MR.  AoV sclerosis w/o AS.   TRANSTHORACIC ECHOCARDIOGRAM  01/30/2021   EF 55 to 60%.  Mild LVH.  GR 1 DD.  Elevated LAP.  Moderate LA dilation.  Mild MR with mild MS.  Mild aortic valve stenosis.:   TUBAL LIGATION     wrist fistula repair Left    dialysis for one year     Home Medications:  Prior to Admission medications   Medication Sig Start Date End Date Taking? Authorizing Provider  albuterol (VENTOLIN HFA) 108 (90 Base) MCG/ACT inhaler TAKE 2 PUFFS BY MOUTH EVERY 6 HOURS AS NEEDED FOR WHEEZE OR SHORTNESS OF BREATH Patient taking differently: Inhale 2 puffs  into the lungs every 6 (six) hours as needed for shortness of breath. 07/30/22  Yes Leslye Peer, MD  arformoterol (BROVANA) 15 MCG/2ML NEBU Take 2 mLs (15 mcg total) by nebulization 2 (two) times daily. 08/30/19  Yes Leslye Peer,  MD  azaTHIOprine (IMURAN) 50 MG tablet Take 2 & 1/2 tablets (125 mg) by mouth daily at 3pm Patient taking differently: Take 125 mg by mouth daily. 10/21/21  Yes Pokhrel, Laxman, MD  bisacodyl (DULCOLAX) 5 MG EC tablet Take 1 tablet (5 mg total) by mouth daily as needed for moderate constipation. 10/05/21  Yes Drema Dallas, MD  budesonide (PULMICORT) 0.5 MG/2ML nebulizer solution Take 2 mLs (0.5 mg total) by nebulization 2 (two) times daily. Patient taking differently: Take 2 mLs by nebulization 2 (two) times daily as needed (wheezing). 08/24/19  Yes Katherine Grandchild, MD  calcitRIOL (ROCALTROL) 0.25 MCG capsule Take 0.25 mcg by mouth See admin instructions. Takes every 3 days.   Yes [provider]  CVS VITAMIN C 500 MG tablet TAKE 1 TABLET (500 MG TOTAL) BY MOUTH DAILY. Patient taking differently: Take 500 mg by mouth daily. 02/03/22  Yes Katherine Grandchild, MD  cyclobenzaprine (FLEXERIL) 10 MG tablet TAKE 1 TABLET BY MOUTH TWICE A DAY AS NEEDED FOR MUSCLE SPASM Patient taking differently: Take 10 mg by mouth daily as needed for muscle spasms. 12/03/22  Yes Katherine Grandchild, MD  ELIQUIS 5 MG TABS tablet TAKE 1 TABLET BY MOUTH TWICE A DAY 10/22/22  Yes Katherine Grandchild, MD  empagliflozin (JARDIANCE) 10 MG TABS tablet Take 1 tablet (10 mg total) by mouth daily. 11/11/22 01/10/23 Yes Noralee Stain, DO  Ensure Max Protein (ENSURE MAX PROTEIN) LIQD Take 330 mLs (11 oz total) by mouth 2 (two) times daily. 12/12/21  Yes Narda Bonds, MD  fluticasone (FLONASE) 50 MCG/ACT nasal spray PLACE 2 SPRAYS INTO BOTH NOSTRILS 2 TIMES DAILY. Patient taking differently: Place 2 sprays into both nostrils 2 (two) times daily. 08/04/22  Yes Katherine Grandchild, MD  furosemide (LASIX) 40 MG  tablet Take 1 tablet (40 mg total) by mouth 2 (two) times daily. 11/26/22  Yes Hughie Closs, MD  gabapentin (NEURONTIN) 100 MG capsule Take 1 capsule (100 mg total) by mouth at bedtime. Patient taking differently: Take 100 mg by mouth daily as needed (For pain). 09/11/21  Yes Kathlen Mody, MD  guaiFENesin-dextromethorphan (ROBITUSSIN DM) 100-10 MG/5ML syrup Take 15 mLs by mouth every 4 (four) hours as needed for cough. 11/23/22  Yes Kathlen Mody, MD  hydrocortisone (ANUSOL-HC) 25 MG suppository Place 1 suppository (25 mg total) rectally 2 (two) times daily. Patient taking differently: Place 25 mg rectally 2 (two) times daily as needed for hemorrhoids. 11/23/22  Yes Kathlen Mody, MD  insulin aspart (NOVOLOG FLEXPEN) 100 UNIT/ML FlexPen Inject 4-10 Units into the skin 3 (three) times daily with meals. Per sliding scale   Yes [provider]  Insulin Glargine (BASAGLAR KWIKPEN [place]) Inject 12 Units into the skin daily.   Yes [provider]  isosorbide mononitrate (IMDUR) 30 MG 24 hr tablet Take 1 tablet (30 mg total) by mouth daily. 11/12/22  Yes Gonfa, Boyce Medici, MD  KLOR-CON M10 10 MEQ tablet TAKE 1 TABLET BY MOUTH 2 TIMES DAILY. 09/19/22  Yes Katherine Grandchild, MD  lamoTRIgine (LAMICTAL) 25 MG tablet TAKE 1 TABLET BY MOUTH EVERYDAY AT BEDTIME Patient taking differently: Take 25 mg by mouth daily. 02/19/22  Yes Ranelle Oyster, MD  LORazepam (ATIVAN) 1 MG tablet Take 1 mg by mouth 2 (two) times daily. 11/28/22  Yes [provider]  metFORMIN (GLUCOPHAGE) 500 MG tablet Take 500 mg by mouth 2 (two) times daily. 12/08/21  Yes [provider]  metoprolol tartrate (LOPRESSOR) 50  MG tablet Take 1 tablet (50 mg total) by mouth 2 (two) times daily. 11/10/22 01/09/23 Yes Noralee Stain, DO  montelukast (SINGULAIR) 10 MG tablet TAKE 1 TABLET BY MOUTH EVERYDAY AT BEDTIME Patient taking differently: Take 10 mg by mouth at bedtime. 05/04/22  Yes Leslye Peer, MD  Multiple Vitamin  (MULTIVITAMIN WITH MINERALS) TABS tablet Take 1 tablet by mouth daily. 12/13/21  Yes Narda Bonds, MD  nitroGLYCERIN (NITROSTAT) 0.4 MG SL tablet PLACE 1 TAB UNDER TONGUE EVERY FOR CHEST PAIN Patient taking differently: Place 0.4 mg under the tongue every 5 (five) minutes as needed for chest pain. 06/30/22  Yes Marykay Lex, MD  oxyCODONE (OXY IR/ROXICODONE) 5 MG immediate release tablet Take 1 tablet (5 mg total) by mouth every 6 (six) hours as needed for severe pain. Patient taking differently: Take 10 mg by mouth 2 (two) times daily. 12/02/22  Yes Katherine Grandchild, MD  pantoprazole (PROTONIX) 40 MG tablet TAKE 1 TABLET BY MOUTH EVERY DAY 08/05/22  Yes Katherine Grandchild, MD  predniSONE (DELTASONE) 5 MG tablet Take 5 mg by mouth daily with breakfast.   Yes [provider]  promethazine (PHENERGAN) 25 MG tablet Take 1 tablet (25 mg total) by mouth every 6 (six) hours as needed for nausea or vomiting. 05/20/22  Yes Loyola Mast, MD  ranolazine (RANEXA) 1000 MG SR tablet TAKE 1 TABLET BY MOUTH TWICE A DAY 10/31/22  Yes Katherine Grandchild, MD  rosuvastatin (CRESTOR) 20 MG tablet TAKE 1 TABLET BY MOUTH EVERY DAY 07/29/22  Yes Marykay Lex, MD  sertraline (ZOLOFT) 100 MG tablet Take 2 tablets (200 mg total) by mouth daily. 06/16/22  Yes Katherine Grandchild, MD  clopidogrel (PLAVIX) 75 MG tablet Take 1 tablet (75 mg total) by mouth daily. Patient not taking: Reported on 12/07/2022 11/11/22 01/10/23  Noralee Stain, DO  Continuous Blood Gluc Receiver (FREESTYLE LIBRE 2 READER) DEVI 1 Act by Does not apply route daily. 06/17/22   Katherine Grandchild, MD  Continuous Blood Gluc Sensor (FREESTYLE LIBRE 2 SENSOR) MISC 1 Act by Does not apply route daily. 06/17/22   Katherine Grandchild, MD  Continuous Blood Gluc Sensor (FREESTYLE LIBRE 3 SENSOR) MISC 1 Units by Does not apply route 3 (three) times daily. Place 1 sensor on the skin every 14 days. Use to check glucose continuously 11/26/22   Hughie Closs, MD   glucose blood (GLUCOCOM TEST) test strip Use as instructed 11/26/22   Hughie Closs, MD  insulin glargine-yfgn (SEMGLEE) 100 UNIT/ML injection Inject 0.08 mLs (8 Units total) into the skin daily. Patient not taking: Reported on 12/07/2022 11/24/22   Kathlen Mody, MD  Insulin Pen Needle (PEN NEEDLES) 32G X 4 MM MISC Use as needed to administer insulin. E11.9 02/01/22   Katherine Grandchild, MD  Lancets (FREESTYLE) lancets Use as directed up to four times daily 11/26/22   Hughie Closs, MD  witch hazel-glycerin (TUCKS) pad Apply topically as needed for itching. Patient not taking: Reported on 12/07/2022 11/23/22   Kathlen Mody, MD    Inpatient Medications: Scheduled Meds:  amiodarone  200 mg Oral Daily   apixaban  5 mg Oral BID   arformoterol  15 mcg Nebulization BID   aspirin EC  81 mg Oral Daily   azaTHIOprine  125 mg Oral Daily   budesonide  2 mL Nebulization BID   Chlorhexidine Gluconate Cloth  6 each Topical Daily   clopidogrel  75 mg Oral Daily  fluticasone  1 spray Each Nare Daily   insulin aspart  0-20 Units Subcutaneous TID WC   insulin aspart  0-5 Units Subcutaneous QHS   insulin aspart  6 Units Subcutaneous TID WC   insulin glargine-yfgn  8 Units Subcutaneous BID   lamoTRIgine  25 mg Oral QHS   methylPREDNISolone (SOLU-MEDROL) injection  80 mg Intravenous Q12H   mupirocin ointment   Nasal BID   pantoprazole  40 mg Oral Daily   rosuvastatin  20 mg Oral Daily   sertraline  200 mg Oral Daily   Continuous Infusions:  sodium chloride Stopped (12/02/2022 1355)   ceFEPime (MAXIPIME) IV 2 g (12/10/22 0257)   vancomycin Stopped (12/09/22 1859)   PRN Meds: acetaminophen, cyclobenzaprine, docusate sodium, gabapentin, ipratropium-albuterol, LORazepam, magic mouthwash, metoprolol tartrate, morphine injection, morphine injection, nitroGLYCERIN, ondansetron (ZOFRAN) IV, mouth rinse, oxyCODONE, polyethylene glycol, promethazine, sodium chloride  Allergies:    Allergies  Allergen Reactions    Tetracycline Hives    Patient tolerated Doxycycline Dec 2020   Niacin Other (See Comments)    Mouth blisters   Niaspan [Niacin Er] Other (See Comments)    Mouth blisters   Sulfa Antibiotics Nausea Only and Other (See Comments)    "Tears up stomach"   Sulfonamide Derivatives Other (See Comments)    Reaction: per patient "tears her stomach up"   Codeine Nausea And Vomiting   Erythromycin Nausea And Vomiting   Hydromorphone Hcl Nausea And Vomiting   Morphine And Related Nausea And Vomiting   Nalbuphine Nausea And Vomiting    Nubain   Sulfasalazine Nausea Only and Other (See Comments)    per patient "tears her stomach up", "Tears up stomach"   Tape Rash and Other (See Comments)    No "plastic" tape," please----cloth tape only    Social History:   Social History   Socioeconomic History   Marital status: Widowed    Spouse name: Not on file   Number of children: 0   Years of education: Not on file   Highest education level: High school graduate  Occupational History   Occupation: Disability    Comment: since 1989  Tobacco Use   Smoking status: Former    Packs/day: 1.00    Years: 30.00    Additional pack years: 0.00    Total pack years: 30.00    Types: Cigarettes    Quit date: 08/17/2002    Years since quitting: 20.3   Smokeless tobacco: Never  Vaping Use   Vaping Use: Never used  Substance and Sexual Activity   Alcohol use: No   Drug use: No   Sexual activity: Not Currently  Other Topics Concern   Not on file  Social History Narrative   ** Merged History Encounter ** She is currently married, and the caregiver of her husband who is recovering from surgery for tongue cancer now diagnosed with lung cancer. Prior to his diagnosis of her husband, she actually had adopted a 15-year-old child who she knows caring for as well. With all the surgeries, they have been quite financially troubled. Thanks the help of her community and church, they have been able to stay "alfoat."      She is a former smoker who quit in 2004 after a 30-pack-year history.   She is active chasing a 49-year-old child, does not do routine exercise. She's been quite depressed with the condition of her husband, and admits to eating comfort herself.   She does not drink alcohol.  05/13/2021 Patient reports that her husband is deceased. She is grieving the loss of her husband and the decline in her physical independence.    Social Determinants of Health   Financial Resource Strain: Not on file  Food Insecurity: No Food Insecurity (12/09/2022)   Hunger Vital Sign    Worried About Running Out of Food in the Last Year: Never true    Ran Out of Food in the Last Year: Never true  Transportation Needs: Unmet Transportation Needs (12/09/2022)   PRAPARE - Administrator, Civil Service (Medical): Yes    Lack of Transportation (Non-Medical): Yes  Physical Activity: Not on file  Stress: Not on file  Social Connections: Not on file  Intimate Partner Violence: Not At Risk (12/09/2022)   Humiliation, Afraid, Rape, and Kick questionnaire    Fear of Current or Ex-Partner: No    Emotionally Abused: No    Physically Abused: No    Sexually Abused: No    Family History:    Family History  Problem Relation Age of Onset   Cancer Mother        liver   Heart disease Father    Cancer Father        colon   Arrhythmia Brother        Atrial Fibrillation   Arrhythmia Paternal Aunt        Atrial Fibrillation     ROS:  Please see the history of present illness.   All other ROS reviewed and negative.     Physical Exam/Data:   Vitals:   12/10/22 0000 12/10/22 0100 12/10/22 0101 12/10/22 0242  BP:  119/85 119/85 115/74  Pulse: (!) 104 (!) 126 (!) 127   Resp: 19 15 (!) 21   Temp:      TempSrc:      SpO2: 91% (!) 89% (!) 87%   Weight:      Height:        Intake/Output Summary (Last 24 hours) at 12/10/2022 0411 Last data filed at 12/09/2022 2230 Gross per 24 hour  Intake 2133.24 ml   Output 1301 ml  Net 832.24 ml      12/09/2022    5:00 AM 12/08/2022    3:40 AM 12/07/2022    2:21 AM  Last 3 Weights  Weight (lbs) 185 lb 3 oz 189 lb 9.5 oz 186 lb 8.2 oz  Weight (kg) 84 kg 86 kg 84.6 kg     Body mass index is 34.99 kg/m.  General:  ill appearing in no acute distress HEENT: normal Neck: no JVD Vascular: No carotid bruits; Distal pulses 2+ bilaterally Cardiac:  normal S1, S2; tachycardia, irregular Lungs:  clear to auscultation anteriorly Abd: soft, nontender, no hepatomegaly  Ext: trace edema Musculoskeletal:  No deformities, BUE and BLE strength normal and equal Skin: warm and dry  Neuro:  CNs 2-12 intact, no focal abnormalities noted Psych:  Normal affect   EKG:  The EKG was personally reviewed and demonstrates: A-fib with rapid ventricular rate with left bundle branch block Telemetry:  Telemetry was personally reviewed and demonstrates: Same as EKG  Relevant CV Studies: 11/09/2022 TTE LVEF 35-40 percent, RV normal mild aortic stenosis  Laboratory Data:  High Sensitivity Troponin:   Recent Labs  Lab 11/28/22 1125 12/07/2022 1230 12/13/2022 1431 12/10/22 0103 12/10/22 0303  TROPONINIHS 27* 27* 291* 1,382* 1,518*     Chemistry Recent Labs  Lab 12/08/22 0308 12/08/22 1200 12/08/22 1257 12/09/22 0415 12/09/22 1118 12/10/22 0303  NA  131* 133*   < > 136 137 134*  K 3.1* 3.9   < > 3.5 4.1 4.6  CL 97* 101  --  103  --  99  CO2 22 21*  --  25  --  24  GLUCOSE 369* 242*  --  223*  --  304*  BUN 19 20  --  17  --  21  CREATININE 1.31* 1.14*  --  1.16*  --  1.23*  CALCIUM 8.7* 9.4  --  9.0  --  9.3  MG 1.7  --   --  2.0  --  1.7  GFRNONAA 45* 53*  --  52*  --  49*  ANIONGAP 12 11  --  8  --  11   < > = values in this interval not displayed.    Recent Labs  Lab 11/22/2022 1230  PROT 7.0  ALBUMIN 2.8*  AST 29  ALT 12  ALKPHOS 149*  BILITOT 0.8   Lipids No results for input(s): "CHOL", "TRIG", "HDL", "LABVLDL", "LDLCALC", "CHOLHDL" in the  last 168 hours.  Hematology Recent Labs  Lab 12/08/22 0308 12/08/22 1257 12/09/22 0415 12/09/22 1118 12/10/22 0303  WBC 6.7  --  9.6  --  12.0*  RBC 2.84*  --  2.78*  --  2.96*  HGB 8.2*   < > 8.0* 9.2* 8.5*  HCT 25.9*   < > 26.2* 27.0* 27.3*  MCV 91.2  --  94.2  --  92.2  MCH 28.9  --  28.8  --  28.7  MCHC 31.7  --  30.5  --  31.1  RDW 18.7*  --  18.8*  --  18.8*  PLT 168  179  --  179  --  194   < > = values in this interval not displayed.   Thyroid  Recent Labs  Lab 12/05/2022 1230 11/25/2022 1231  TSH  --  1.891  FREET4 1.15*  --     BNP Recent Labs  Lab 12/14/2022 1231  BNP 657.3*    DDimer  Recent Labs  Lab 12/08/22 0308  DDIMER 0.87*     Radiology/Studies:  ECHOCARDIOGRAM LIMITED BUBBLE STUDY  Result Date: 12/09/2022    ECHOCARDIOGRAM LIMITED REPORT   Patient Name:   DAVI ROTAN Date of Exam: 12/09/2022 Medical Rec #:  [id number]          Height:       61.0 in Accession #:    [id number]         Weight:       185.2 lb Date of Birth:  [birth year]          BSA:          1.828 m Patient Age:    66 years           BP:           93/74 mmHg Patient Gender: F                  HR:           106 bpm. Exam Location:  Inpatient Procedure: Limited Echo, Color Doppler, Limited Color Doppler and Saline            Contrast Bubble Study Indications:    Increased O2 requirement  History:        Patient has prior history of Echocardiogram examinations, most  recent 11/09/2022. CHF, CAD; Risk Factors:Diabetes and                 Dyslipidemia. Shock, Acute Pulmonary Edema,.  Sonographer:    Milbert Coulter Referring Phys: [id number] Vinnie Level SMITH IMPRESSIONS  1. Poor acoustical windows prohibit estimation of overall EF which appears reduced. The Left ventricular endocardial border not optimally defined to evaluate regional wall motion.  2. Right ventricular systolic function is normal. The right ventricular size is normal.  3. The mitral valve is normal in structure. No  evidence of mitral valve regurgitation. No evidence of mitral stenosis. Moderate mitral annular calcification.  4. The aortic valve is normal in structure. Aortic valve regurgitation is mild. No aortic stenosis is present. Aortic valve mean gradient measures 11.0 mmHg. Aortic valve Vmax measures 2.30 m/s.  5. The inferior vena cava is normal in size with greater than 50% respiratory variability, suggesting right atrial pressure of 3 mmHg.  6. Agitated saline contrast bubble study was positive with shunting observed within 3-6 cardiac cycles suggestive of interatrial shunt. There is a patent foramen ovale with bidirectional shunting across atrial septum. FINDINGS  Left Ventricle: Poor acoustical windows prohibit estimation of overall EF which appears reduced. Left ventricular endocardial border not optimally defined to evaluate regional wall motion. The left ventricular internal cavity size was normal in size. There is no left ventricular hypertrophy. Right Ventricle: The right ventricular size is normal. No increase in right ventricular wall thickness. Right ventricular systolic function is normal. Left Atrium: Left atrial size was normal in size. Right Atrium: Right atrial size was normal in size. Pericardium: There is no evidence of pericardial effusion. Mitral Valve: The mitral valve is normal in structure. Moderate mitral annular calcification. No evidence of mitral valve stenosis. Tricuspid Valve: The tricuspid valve is normal in structure. Tricuspid valve regurgitation is mild . No evidence of tricuspid stenosis. Aortic Valve: The aortic valve is normal in structure. Aortic valve regurgitation is mild. No aortic stenosis is present. Aortic valve mean gradient measures 11.0 mmHg. Aortic valve peak gradient measures 21.2 mmHg. Pulmonic Valve: The pulmonic valve was normal in structure. Pulmonic valve regurgitation is not visualized. No evidence of pulmonic stenosis. Aorta: The aortic root is normal in size and  structure. Venous: The inferior vena cava is normal in size with greater than 50% respiratory variability, suggesting right atrial pressure of 3 mmHg. IAS/Shunts: No atrial level shunt detected by color flow Doppler. Agitated saline contrast was given intravenously to evaluate for intracardiac shunting. Agitated saline contrast bubble study was positive with shunting observed within 3-6 cardiac cycles suggestive of interatrial shunt. AORTIC VALVE AV Vmax:           230.00 cm/s AV Vmean:          155.000 cm/s AV VTI:            0.336 m AV Peak Grad:      21.2 mmHg AV Mean Grad:      11.0 mmHg LVOT Vmax:         121.00 cm/s LVOT Vmean:        75.500 cm/s LVOT VTI:          0.164 m LVOT/AV VTI ratio: 0.49 TRICUSPID VALVE TR Peak grad:   53.6 mmHg TR Vmax:        366.00 cm/s  SHUNTS Systemic VTI: 0.16 m Armanda Magic MD Electronically signed by Armanda Magic MD Signature Date/Time: 12/09/2022/4:16:28 PM    Final    VAS [place]  LOWER EXTREMITY VENOUS (DVT)  Result Date: 12/09/2022  Lower Venous DVT Study Patient Name:  MASHELLE BUSICK  Date of Exam:   12/09/2022 Medical Rec #: [id number]           Accession #:    [id number] Date of Birth: 11-01-1955           Patient Gender: F Patient Age:   49 years Exam Location:  [facility] Procedure:      VAS [place] LOWER EXTREMITY VENOUS (DVT) Referring Phys: Levon Hedger --------------------------------------------------------------------------------  Indications: Pain in bilateral lower extremities.  Risk Factors: Known history of PAD. Limitations: Patient pain - unable to tolerate compressions at some segments. Comparison Study: 08-28-2021 Prior bilateral lower extremity venous study was                   negative for DVT. Performing Technologist: Jean Rosenthal RDMS, RVT  Examination Guidelines: A complete evaluation includes B-mode imaging, spectral Doppler, color Doppler, and power Doppler as needed of all accessible portions of each vessel. Bilateral testing is considered an  integral part of a complete examination. Limited examinations for reoccurring indications may be performed as noted. The reflux portion of the exam is performed with the patient in reverse Trendelenburg.  +---------+---------------+---------+-----------+---------------+--------------+ RIGHT    CompressibilityPhasicitySpontaneityProperties     Thrombus Aging +---------+---------------+---------+-----------+---------------+--------------+ CFV      Full           No       Yes                                      +---------+---------------+---------+-----------+---------------+--------------+ SFJ      Full                                                             +---------+---------------+---------+-----------+---------------+--------------+ FV Prox  Full                                                             +---------+---------------+---------+-----------+---------------+--------------+ FV Mid                  No       Yes        Patent by color               +---------+---------------+---------+-----------+---------------+--------------+ FV Distal               No       Yes        Patent by color               +---------+---------------+---------+-----------+---------------+--------------+ PFV      Full                                                             +---------+---------------+---------+-----------+---------------+--------------+ POP  Full           No       Yes                                      +---------+---------------+---------+-----------+---------------+--------------+ PTV      Full                                                             +---------+---------------+---------+-----------+---------------+--------------+ PERO     Full                                                             +---------+---------------+---------+-----------+---------------+--------------+    +---------+---------------+---------+-----------+---------------+--------------+ LEFT     CompressibilityPhasicitySpontaneityProperties     Thrombus Aging +---------+---------------+---------+-----------+---------------+--------------+ CFV      Full           No       Yes                                      +---------+---------------+---------+-----------+---------------+--------------+ SFJ      Full                                                             +---------+---------------+---------+-----------+---------------+--------------+ FV Prox  Full                                                             +---------+---------------+---------+-----------+---------------+--------------+ FV Mid                  No       Yes        Patent by color               +---------+---------------+---------+-----------+---------------+--------------+ FV Distal               No       Yes        Patent by color               +---------+---------------+---------+-----------+---------------+--------------+ PFV      Full                                                             +---------+---------------+---------+-----------+---------------+--------------+ POP      Full           No       Yes  Patent by color               +---------+---------------+---------+-----------+---------------+--------------+ PTV      Full                                                             +---------+---------------+---------+-----------+---------------+--------------+ PERO     Full                                                             +---------+---------------+---------+-----------+---------------+--------------+     Summary: RIGHT: - There is no evidence of deep vein thrombosis in the lower extremity. However, portions of this examination were limited- see technologist comments above.  - No cystic structure found in the popliteal fossa.  LEFT: - There is  no evidence of deep vein thrombosis in the lower extremity. However, portions of this examination were limited- see technologist comments above.  - No cystic structure found in the popliteal fossa.  *See table(s) above for measurements and observations. Electronically signed by Sherald Hess MD on 12/09/2022 at 1:12:41 PM.    Final    DG Chest Port 1 View  Result Date: 12/09/2022 CLINICAL DATA:  Dyspnea EXAM: PORTABLE CHEST 1 VIEW COMPARISON:  12/07/2022 FINDINGS: Right jugular central line is again seen. Cardiac shadow is mildly enlarged. Postsurgical changes are seen. Right-sided IV is noted in the upper arm. Slight increase in central vascular congestion is noted. Interstitial changes are again seen bilaterally slightly worsened on the left likely representing edema given the abrupt onset. IMPRESSION: Increasing interstitial changes likely related to mild CHF. Electronically Signed   By: Alcide Clever M.D.   On: 12/09/2022 09:38   US Renal Transplant w/Doppler  Result Date: 12/07/2022 CLINICAL DATA:  Urinary tract infection EXAM: ULTRASOUND OF RENAL TRANSPLANT WITH RENAL DOPPLER ULTRASOUND TECHNIQUE: Ultrasound examination of the renal transplant was performed with gray-scale, color and duplex doppler evaluation. COMPARISON:  CT pelvis 10/19/2021 FINDINGS: Transplant kidney location: Left pelvis Transplant Kidney: Renal measurements: 11.3 x 5.8 x 5.7 cm = volume: . Normal in size and parenchymal echogenicity. No evidence of mass or hydronephrosis. No peri-transplant fluid collection seen. Color flow in the main renal artery:  Yes Color flow in the main renal vein:  Yes Duplex Doppler Evaluation: Main Renal Artery Velocity: 81 cm/sec Main Renal Artery Resistive Index: 0.8 Venous waveform in main renal vein:  Present Intrarenal resistive index in upper pole:  0.7 (normal 0.6-0.8; equivocal 0.8-0.9; abnormal >= 0.9) Intrarenal resistive index in lower pole: 0.5 (normal 0.6-0.8; equivocal 0.8-0.9;  abnormal >= 0.9) Bladder: Normal for degree of bladder distention. Other findings:  None. IMPRESSION: Normal ultrasound evaluation of left pelvic transplant kidney. Electronically Signed   By: Burman Nieves M.D.   On: 12/07/2022 21:58   US RENAL  Result Date: 12/07/2022 CLINICAL DATA:  UTI.  Left pelvic renal transplant. EXAM: RENAL / URINARY TRACT ULTRASOUND COMPLETE COMPARISON:  04/19/2022. FINDINGS: Right Kidney: Not well delineated. Left Kidney: Not well delineated. Bladder: Appears normal for degree of bladder distention. Prevoid bladder volume is 492 mL. Postvoid bladder volume could not be obtained. Other: None. IMPRESSION:  1. Native kidneys are not well delineated on exam. 2. Normal examination of the urinary bladder. Electronically Signed   By: Thornell Sartorius M.D.   On: 12/07/2022 21:52   DG CHEST PORT 1 VIEW  Result Date: 12/07/2022 CLINICAL DATA:  Central venous catheter placement EXAM: PORTABLE CHEST 1 VIEW COMPARISON:  Previous studies including the examination done earlier today FINDINGS: There is interval placement of right IJ central venous catheter with its tip in superior vena cava. Transverse diameter of heart is increased. There is previous coronary bypass surgery. There is slight prominence of interstitial markings in right lung, possibly scarring. There are no signs of alveolar pulmonary edema or focal pulmonary consolidation. There is no significant pleural effusion or pneumothorax. IMPRESSION: Tip of right IJ central venous catheter is seen in superior vena cava. There is no pneumothorax. Electronically Signed   By: Ernie Avena M.D.   On: 12/07/2022 16:53   [place] EKG SITE RITE  Result Date: 12/07/2022 If [facility] image not attached, placement could not be confirmed due to current cardiac rhythm.  DG Chest Port 1 View  Result Date: 12/07/2022 CLINICAL DATA:  67 year old female acute respiratory failure. EXAM: PORTABLE CHEST 1 VIEW COMPARISON:  Portable chest 12/13/2022  and earlier. FINDINGS: Portable AP semi upright view at 0525 hours. Cardiomegaly. Prior CABG. Stable cardiac size and mediastinal contours. Visualized tracheal air column is within normal limits. Stable lung volumes. Stable lung markings since last year, no convincing pulmonary edema. No pneumothorax, pleural effusion or confluent opacity. No acute osseous abnormality identified. Paucity of bowel gas the visible abdomen. IMPRESSION: Chronic cardiomegaly, CABG.  No acute cardiopulmonary abnormality. Electronically Signed   By: Odessa Fleming M.D.   On: 12/07/2022 06:19   DG Chest Portable 1 View  Result Date: 12/14/2022 CLINICAL DATA:  Chest pain. EXAM: PORTABLE CHEST 1 VIEW COMPARISON:  Chest radiograph dated 11/28/2022. FINDINGS: The heart is enlarged. Vascular calcifications are seen in the aortic arch. Median sternotomy wires are redemonstrated with multiple wire fractures. Mild diffuse bilateral interstitial opacities are noted. There is mild bibasilar atelectasis. No pleural effusion or pneumothorax. Degenerative changes are seen in the spine. IMPRESSION: 1. Mild diffuse bilateral interstitial opacities, which may represent pulmonary edema or atypical infection superimposed on chronic interstitial lung disease. 2. Cardiomegaly. Electronically Signed   By: Romona Curls M.D.   On: 11/23/2022 12:53     Assessment and Plan:   Chest pain Extensive CAD post CABG Hard to distinguish if patient troponin elevation and chest pain is related to demand ischemia given her profound critical illness and A-fib with rapid ventricular rate versus acute coronary syndrome.  Favor prior. Complicating the fact that if this were acute coronary syndrome is the fact that her vascular access is incredibly difficult and that she is considering comfort measures and no escalation of care.  Therefore I think there is no benefit for left heart catheterization.  Aspirin, heparin be considered if in line with patient's goals of care;  however, I do not know how much benefit this with give her and her current clinical state. Do not feel strongly she warrants a repeat TTE in AM as well.    Atrial fibrillation: Continue oral Amio. Continue Eliquis.      Risk Assessment/Risk Scores:          CHA2DS2-VASc Score = 6   This indicates a 9.7% annual risk of stroke. The patient's score is based upon: CHF History: 1 HTN History: 1 Diabetes History: 1 Stroke History: 0  Vascular Disease History: 1 Age Score: 1 Gender Score: 1         For questions or updates, please contact [facility] Please consult [email or web address] for contact info under    Signed, Thana Ates, MD  12/10/2022 4:11 AM

## 2022-12-11 DIAGNOSIS — R579 Shock, unspecified: Secondary | ICD-10-CM | POA: Diagnosis not present

## 2022-12-11 DIAGNOSIS — I5023 Acute on chronic systolic (congestive) heart failure: Secondary | ICD-10-CM | POA: Diagnosis not present

## 2022-12-11 DIAGNOSIS — J9621 Acute and chronic respiratory failure with hypoxia: Secondary | ICD-10-CM | POA: Diagnosis not present

## 2022-12-11 DIAGNOSIS — J810 Acute pulmonary edema: Secondary | ICD-10-CM | POA: Diagnosis not present

## 2022-12-11 DIAGNOSIS — I509 Heart failure, unspecified: Secondary | ICD-10-CM | POA: Diagnosis not present

## 2022-12-11 LAB — CULTURE, BLOOD (ROUTINE X 2)
Culture: NO GROWTH
Culture: NO GROWTH
Special Requests: ADEQUATE
Special Requests: ADEQUATE

## 2022-12-11 MED ORDER — LORAZEPAM 2 MG/ML IJ SOLN
1.0000 mg | Freq: Four times a day (QID) | INTRAMUSCULAR | Status: DC
  Administered 2022-12-11 – 2022-12-12 (×6): 1 mg via INTRAVENOUS
  Filled 2022-12-11 (×6): qty 1

## 2022-12-11 MED ORDER — MORPHINE SULFATE (PF) 4 MG/ML IV SOLN
4.0000 mg | Freq: Four times a day (QID) | INTRAVENOUS | Status: DC
  Administered 2022-12-11 – 2022-12-12 (×5): 4 mg via INTRAVENOUS
  Filled 2022-12-11 (×5): qty 1

## 2022-12-11 MED ORDER — FUROSEMIDE 10 MG/ML IJ SOLN
60.0000 mg | Freq: Once | INTRAMUSCULAR | Status: AC
  Administered 2022-12-11: 60 mg via INTRAVENOUS
  Filled 2022-12-11: qty 6

## 2022-12-11 NOTE — Progress Notes (Signed)
Daily Progress Note   Patient Name: Katherine [name]       Date: 12/11/2022 DOB: 11-02-1955  Age: 67 y.o. MRN#: [id number] Attending Physician: Lorin Glass, MD Primary Care Physician: [name], MD Admit Date: 11/29/2022  Reason for Consultation/Follow-up: Establishing goals of care  Patient Profile/HPI:   67 y.o. female  with past medical history of chronic respiratory failure- on 7L oxygen at home, CHF with reduced EF, CAD, PAD, renal transplant, COPD, ILD, a-fib  admitted on 11/23/2022 with chest pain and weakness. She is admitted and being treated for shock- cardiogenic vs adrenal insufficiency, UTI, tachycardia, interstitial lung disease. Palliative medicine consulted for GOC discussion.    Subjective: Chart reviewed including labs, progress notes, imaging from this and previous encounters, medication use.  Ambriana looks a bit worse today. Sherry at bedside reports she had some increased pain and anxiety. Her RR has increased- up to 40 while I"m at bedside. She is oriented, but sleepy. She reports feeling tired. She is not ready for full sedation and weaning of her HFNC yet- she is hoping for a visit from an estranged family member.  We discussed starting scheduled medication for her comfort and she is in agreement. She is not eating, taking in only ice chips. Urine output has decreased, she has some increased edema today.  Sherry asked about timing- we discussed that as long as she is on the [facility] she could go on for several days-weeks- however, with her significant status change today I wouldn't be surprised if she was not here with [place] in a matter of days even with the HF oxygen.  [name] is prepared to make decision to titrate HFO2 off if [place] becomes unresponsive, or too somnolent  to communicate.    Review of Systems  Respiratory:  Positive for shortness of breath.      Physical Exam Vitals and nursing note reviewed.  Constitutional:      General: She is not in acute distress.    Appearance: She is ill-appearing.  Pulmonary:     Comments: On HHFNC Neurological:     Mental Status: She is alert.             Vital Signs: BP (!) 127/106   Pulse (!) 118   Temp 98.1 F (36.7 C)   Resp (!)  30   Ht 5\' 1"  (1.549 m)   Wt 82.6 kg   SpO2 (!) 87%   BMI 34.41 kg/m  SpO2: SpO2: (!) 87 % O2 Device: O2 Device: High Flow Nasal Cannula O2 Flow Rate: O2 Flow Rate (L/min): 45 L/min  Intake/output summary:  Intake/Output Summary (Last 24 hours) at 12/11/2022 1105 Last data filed at 12/11/2022 0800 Gross per 24 hour  Intake 240 ml  Output 1300 ml  Net -1060 ml    LBM: Last BM Date :  (PTA) Baseline Weight: Weight: 84.6 kg Most recent weight: Weight: 82.6 kg       Palliative Assessment/Data: PPS: 20%      Patient Active Problem List   Diagnosis Date Noted   Acute pulmonary edema (HCC) 12/09/2022   Acute on chronic HFrEF (heart failure with reduced ejection fraction) (HCC) 12/09/2022   Acute on chronic hypoxic respiratory failure (HCC) 12/09/2022   Shock (HCC) 11/25/2022   Pressure injury of skin 11/07/2022   Diuretic-induced hypokalemia 06/17/2022   Insulin-requiring or dependent type II diabetes mellitus (HCC) 06/17/2022   Spondylosis of lumbar region without myelopathy or radiculopathy 12/19/2021   Encounter for palliative care involving management of pain 12/19/2021   COPD with acute exacerbation (HCC) 12/07/2021   Goals of care, counseling/discussion 10/03/2021   Class 2 obesity 09/25/2021   Longstanding persistent atrial fibrillation (HCC)    Chronic combined systolic and diastolic heart failure (HCC) 08/06/2021   Obesity (BMI 30-39.9) 08/05/2021   Polymorphic ventricular tachycardia    Bradycardia    COPD (chronic obstructive pulmonary  disease) (HCC)    History of renal transplant    Coronary stent restenosis due to scar tissue    Chronic respiratory failure with hypoxia (HCC) 01/01/2021   Candidal skin infection 08/13/2020   Chronic bronchitis, mucopurulent (HCC) 08/24/2019   Current chronic use of systemic steroids 07/23/2019   OSA (obstructive sleep apnea)    Severe episode of recurrent major depressive disorder, with psychotic features (HCC) 02/16/2019   Obstructive chronic bronchitis without exacerbation 11/03/2018   Long term (current) use of antithrombotics/antiplatelets 04/28/2018   LBBB (left bundle branch block) 10/07/2017   Primary osteoarthritis of right knee 08/18/2017   Mild aortic stenosis by prior echocardiogram 01/28/2017   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   Normocytic anemia 10/13/2016   Fibromyalgia 03/30/2016   Other spondylosis with radiculopathy, lumbar region 03/30/2016   Type 2 diabetes mellitus with hyperlipidemia (HCC) 03/30/2016   Paroxysmal atrial fibrillation (HCC); CHA2DS2VASc score F, HTN, CAD, CVA = 5 06/28/2014   Debility 06/28/2014   CAD S/P percutaneous coronary angioplasty - PCI x 5 to SVG-D1 09/11/2013   Essential hypertension    PAD (peripheral artery disease) (HCC) 08/17/2013   CAD (coronary artery disease) of bypass graft 10/08/2012   Stenosis of right carotid artery without infarction 10/08/2012   Dyslipidemia, goal LDL below 70 10/08/2012   Mitral annular calcification 10/08/2012   Renal transplant disorder 06/12/2012   Chronic allergic rhinitis 04/29/2011   Extrinsic asthma 09/09/2007   GERD 09/09/2007   Morbid obesity - s/p Lap Band 9/'05 05/07/2004    Palliative Care Assessment & Plan    Assessment/Recommendations/Plan  Continue current plan of care- comfort measures only Start scheduled morphine 4mg  q6hr- continue prn as ordered Start scheduled Lorazepam 1mg  q6hr- continue prn as ordered PMT will continue to follow- continue to recommend propofol sedation if she is  awake and becomes ready to titrate of HF oxygen Recommend titrating HF oxygen slowly and adjusting symptom medications accordingly to  decrease risk of respiratory distress which patient greatly fears- once titrated off would place her on her home oxygen of 7LNC   Code Status: DNR  Prognosis:  Hours - Days when transitions to full comfort measures only and begins weaning off HHHF oxygen  Discharge Planning: To Be Determined anticipate hospital death if transitions to full comfort measures only  Care plan was discussed with patient and care team.   Thank you for allowing the Palliative Medicine Team to assist in the care of this patient.  Total time:  80 minutes   Greater than 50%  of this time was spent counseling and coordinating care related to the above assessment and plan.  Ocie Bob, AGNP-C Palliative Medicine   Please contact Palliative Medicine Team phone at [phone] for questions and concerns.

## 2022-12-11 NOTE — Progress Notes (Signed)
NAME:  Katherine [name], MRN:  [id number], DOB:  1955-10-03, LOS: 5 ADMISSION DATE:  12/10/2022, CONSULTATION DATE:  4/21 REFERRING MD:  EDP, CHIEF COMPLAINT:  Chest pain   History of Present Illness:  67 year old woman with chronic respiratory failure on 7 L oxygen at home, HFrEF, known CAD and severe PAD brought in by EMS today for chest discomfort and generalized weakness.  She denies fevers or cough.  EMS found her to have heart rate of 140, oxygen saturation 95%, CBG 315, brought in as code STEMI.  Initial EKG showed left bundle branch block, unable to discern whether this was atrial rhythm.  Soft blood pressures were inaccurate, she is a known vasculopath, she was started empirically on Levophed, blood pressure improved from 60s to 160 currently, Placed on nonrebreather mask. Evaluated by cardiology. Labs showed low troponin, lactate 4.1, 3.3 on repeat, no leukocytosis, stable hemoglobin at 10, BNP 725. Chest x-ray showed bilateral interstitial prominence as prior with cardiomegaly. 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 COPD/ILD -followed by Dr. 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 4/25 patient wishing to transition to comfort care.   Interim History / Subjective:  Patient awaiting further family to arrive prior to discontinuing heated high flow.   Objective   Blood pressure (!) 127/106, pulse (!) 118,  temperature 98.1 F (36.7 C), resp. rate (!) 30, height 5\' 1"  (1.549 m), weight 82.6 kg, SpO2 (!) 87 %.    FiO2 (%):  [90 %-95 %] 95 %   Intake/Output Summary (Last 24 hours) at 12/11/2022 0936 Last data filed at 12/11/2022 0800 Gross per 24 hour  Intake 240 ml  Output 1300 ml  Net -1060 ml   Filed Weights   12/08/22 0340 12/09/22 0500 12/10/22 0415  Weight: 86 kg 84 kg 82.6 kg   Examination:  General: Chronically ill appearing adult female, lying in bed, no distress  HENT: [place]/AT, PERRL Lungs: no use of accessory muscles  Cardiovascular: Irregular, Tachy, HR 121  Abdomen: Soft, obese, non-distended  Extremities: -edema  Neuro: Alert, oriented, follows commands    Resolved Hospital Problem list   Shock  Assessment & Plan:   Acute on chronic hypoxic respiratory failure: on 7 L at home COPD: possible exacerbation ILD Pulmonary edema acute Cannot rule out PNA Leukocytosis Pyuria Concern for UTI Hx of renal transplant Chronic prednsione Atrial fibrillation Acute on chronic HFrEF CAD history status post CABG with multiple stenting  Ischemic cardiomyopathy Hx of anxiety/depression Uncontrolled type 2 DM Normocytic anemia Plan - Comfort measures: patient requesting to continue current oral medication regimen at this time  - Continue Amiodarone, Eliquis, Imuran, Plavix, Crestor, Pred, PPI >> for now, patient understands if she becomes confused and unable to take orals we will d/c  - Once patient has meet with all family will d/c heated high flow and place on 2L [place]  - PRN Morphine, Ativan in place - Continue PRN Oxycodone, Flexeril, Neurontin  Best Practice (right click and "Reselect all SmartList Selections" daily)  Diet/type: Regular diet  Code Status:  DNR Labs   CBC: Recent Labs  Lab 11/23/2022 1805 12/07/22 0113 12/08/22 0308 12/08/22 1257 12/09/22 0415 12/09/22 1118 12/10/22 0303  WBC 10.6* 11.5* 6.7  --  9.6  --  12.0*  NEUTROABS 8.5*  --   --   --   --    --   --   HGB 12.7 11.0* 8.2* 9.5* 8.0* 9.2* 8.5*  HCT 41.0 35.5* 25.9* 28.0* 26.2* 27.0* 27.3*  MCV 93.8 92.9 91.2  --  94.2  --  92.2  PLT 316 284 168  179  --  179  --  194    Basic Metabolic Panel: Recent Labs  Lab 11/21/2022 1230 11/20/2022 1252 12/07/22 0113 12/08/22 0000 12/08/22 0308 12/08/22 1200 12/08/22 1257 12/09/22 0415 12/09/22 1118 12/10/22 0303  NA 133*   < > 133*  --  131* 133* 136 136 137 134*  K 4.9   < > 3.5  --  3.1* 3.9 3.9 3.5 4.1 4.6  CL 96*  --  97*  --  97* 101  --  103  --  99  CO2 21*  --  22  --  22 21*  --  25  --  24  GLUCOSE 340*  --  131* 486* 369* 242*  --  223*  --  304*  BUN 22  --  24*  --  19 20  --  17  --  21  CREATININE 1.51*  --  1.66*  --  1.31* 1.14*  --  1.16*  --  1.23*  CALCIUM 9.1  --  8.9  --  8.7* 9.4  --  9.0  --  9.3  MG 1.4*  --  2.0  --  1.7  --   --  2.0  --  1.7  PHOS  --   --  4.1  --  3.4  --   --   --   --   --    < > = values in this interval not displayed.   GFR: Estimated Creatinine Clearance: 44.4 mL/min (A) (by C-G formula based on SCr of 1.23 mg/dL (H)). Recent Labs  Lab 12/09/2022 1233 11/20/2022 1433 11/19/2022 1805 12/08/2022 1805 12/07/22 0113 12/08/22 0308 12/09/22 0415 12/10/22 0303  PROCALCITON  --   --  <0.10  --   --   --   --   --   WBC  --   --  10.6*   < > 11.5* 6.7 9.6 12.0*  LATICACIDVEN 4.1* 3.3*  --   --   --   --   --   --    < > = values in this interval not displayed.    Liver Function Tests: Recent Labs  Lab 11/25/2022 1230  AST 29  ALT 12  ALKPHOS 149*  BILITOT 0.8  PROT 7.0  ALBUMIN 2.8*   Recent Labs  Lab 11/25/2022 1230  LIPASE 18   Recent Labs  Lab 11/24/2022 1233  AMMONIA 38*    ABG    Component Value Date/Time   PHART 7.366 12/09/2022 1118   PCO2ART 45.3 12/09/2022 1118   PO2ART 95 12/09/2022 1118   HCO3 25.9 12/09/2022 1118   TCO2 27 12/09/2022 1118   ACIDBASEDEF 6.0 (H) 12/08/2022 1257   O2SAT 97 12/09/2022 1118     Coagulation Profile: Recent Labs  Lab  12/09/2022 1230 12/08/22 0308  INR  1.6* 2.2*    Cardiac Enzymes: No results for input(s): "CKTOTAL", "CKMB", "CKMBINDEX", "TROPONINI" in the last 168 hours.  HbA1C: Hgb A1c MFr Bld  Date/Time Value Ref Range Status  11/15/2022 02:29 AM 8.3 (H) 4.8 - 5.6 % Final    Comment:    (NOTE)         Prediabetes: 5.7 - 6.4         Diabetes: >6.4         Glycemic control for adults with diabetes: <7.0   06/16/2022 03:59 PM 8.5 (H) 4.6 - 6.5 % Final    Comment:    Glycemic Control Guidelines for People with Diabetes:Non Diabetic:  <6%Goal of Therapy: <7%Additional Action Suggested:  >8%     CBG: Recent Labs  Lab 12/09/22 1106 12/09/22 1630 12/09/22 1740 12/09/22 2215 12/10/22 0634  GLUCAP 279* 451* 438* 323* 313*   Time Spent: 45 minutes   Jovita Kussmaul, AGACNP-BC [facility]  PCCM Pgr: [phone]

## 2022-12-12 DIAGNOSIS — Z66 Do not resuscitate: Secondary | ICD-10-CM

## 2022-12-12 DIAGNOSIS — J189 Pneumonia, unspecified organism: Secondary | ICD-10-CM

## 2022-12-12 DIAGNOSIS — R579 Shock, unspecified: Secondary | ICD-10-CM | POA: Diagnosis not present

## 2022-12-12 MED ORDER — POLYVINYL ALCOHOL 1.4 % OP SOLN: 1.0000 [drp] | Freq: Four times a day (QID) | OPHTHALMIC | Status: DC | PRN

## 2022-12-12 MED ORDER — GLYCOPYRROLATE 0.2 MG/ML IJ SOLN: 0.3000 mg | INTRAMUSCULAR | Status: DC | PRN

## 2022-12-12 MED ORDER — ACETAMINOPHEN 650 MG RE SUPP: 650.0000 mg | Freq: Four times a day (QID) | RECTAL | Status: DC | PRN

## 2022-12-12 MED ORDER — MORPHINE SULFATE (PF) 2 MG/ML IV SOLN
4.0000 mg | Freq: Four times a day (QID) | INTRAVENOUS | Status: DC
  Administered 2022-12-12: 4 mg via INTRAVENOUS
  Filled 2022-12-12: qty 2

## 2022-12-12 MED ORDER — PROPOFOL 1000 MG/100ML IV EMUL
5.0000 ug/kg/min | INTRAVENOUS | Status: DC
Start: 2022-12-12 — End: 2022-12-12

## 2022-12-12 MED ORDER — HALOPERIDOL LACTATE 5 MG/ML IJ SOLN: 0.5000 mg | INTRAMUSCULAR | Status: DC | PRN

## 2022-12-12 MED ORDER — PROPOFOL 1000 MG/100ML IV EMUL
5.0000 ug/kg/min | INTRAVENOUS | Status: DC
  Administered 2022-12-12: 10 ug/kg/min via INTRAVENOUS
  Filled 2022-12-12 (×2): qty 100

## 2022-12-12 MED ORDER — BIOTENE DRY MOUTH MT LIQD: 15.0000 mL | OROMUCOSAL | Status: DC | PRN

## 2022-12-12 MED ORDER — HYDROMORPHONE HCL 1 MG/ML IJ SOLN
1.0000 mg | INTRAMUSCULAR | Status: DC | PRN
  Administered 2022-12-12: 1 mg via INTRAVENOUS
  Filled 2022-12-12: qty 2

## 2022-12-16 NOTE — Progress Notes (Signed)
Pt taken off Heated HFNC and placed on 3L [place] for pt comfort per MD order. RN at bedside, RT will monitor as need.    11/29/2022 1409  Therapy Vitals  Pulse Rate (!) 125  Resp (!) 27  MEWS Score/Color  MEWS Score 6  MEWS Score Color Comfort Care Only  Oxygen Therapy/Pulse Ox  O2 Device (S)  Nasal Cannula  O2 Therapy Oxygen humidified  O2 Flow Rate (L/min) (S)  3 L/min

## 2022-12-16 NOTE — Progress Notes (Signed)
Pt time of death 1938, pronounced by this RN and Dimas Alexandria. Niece [name] at bedside at time of death. Attending Ghimire MD notified via page. [place] and [facility] notified.   Eye prep completed, post mortem care complete. Pt transported to morgue at 2230 by Tora Duck RN and this RN. Red bathrobe and lower dentures transported to morgue in belongings bag with pt, silver ring on left pointer finger and upper dentures remain on pt due to inability to remove. No other belongings remain. Patient placement notified.

## 2022-12-16 NOTE — Death Summary Note (Signed)
DEATH SUMMARY   Patient Details  Name: Katherine [name] MRN: [id number] DOB: 17-Dec-1955 WUJ:[id number], Bernadene Bell, MD Admission/Discharge Information   Admit Date:  Dec 31, 2022  Date of Death: Date of Death: (P) 01-06-23  Time of Death: Time of Death: (P) 1938  Length of Stay: 6   Principle Cause of death: Hypoxemic respiratory failure due to multiple underlying conditions.  Hospital Diagnoses: Principal Problem:   [facility]) Active Problems:   Acute pulmonary edema (HCC)   Acute on chronic HFrEF (heart failure with reduced ejection fraction) (HCC)   Acute on chronic hypoxic respiratory failure [facility])   Hospital Course: 67 year old female with history of chronic respiratory failure on 7 L oxygen at home, chronic systolic heart failure, coronary artery disease, peripheral arterial disease, renal transplant, interstitial lung disease, A-fib who was admitted to the intensive care unit since 12-31-22 with chest pain, weakness.  She was found by EMS with heart rate of 140, left bundle branch block.  Blood pressures were low, she was started on vasopressors, started on vancomycin and cefepime and admitted to the intensive care unit. 4/23, she was able to come off vasopressors but she had worsening oxygen requirement. Seen by cardiology on arrival, initially brought in as code STEMI however ruled out.  4/25, palliative care following, transition to comfort care.  Moved to the medical floor on 4/26May 22, 2024, patient remained on high flow oxygen, distressed.  Unable to control with injectable medications.  She was transferred back to ICU, started on palliative oxygen weaning with propofol infusion.  Patient ultimately died peacefully with family at the bedside while provided end-of-life care.     Acute on chronic hypoxemic respiratory failure, chronic hypoxic respiratory around 7 L at home. Interstitial lung disease, COPD and pulmonary edema. CKD stage IIIb with history of renal transplant on  chronic prednisone therapy Chronic A-fib Acute on chronic congestive heart failure with reduced ejection fraction CAD with history of CABG and multiple stenting, ischemic cardiomyopathy Uncontrolled type 2 diabetes Severe systemic illness, very high oxygen requirement, lethargic and failure to thrive.        Procedures: None  Consultations: Critical care, palliative, cardiology  The results of significant diagnostics from this hospitalization (including imaging, microbiology, ancillary and laboratory) are listed below for reference.   Significant Diagnostic Studies: ECHOCARDIOGRAM LIMITED BUBBLE STUDY  Result Date: 12/09/2022    ECHOCARDIOGRAM LIMITED REPORT   Patient Name:   Katherine [name] Date of Exam: 12/09/2022 Medical Rec #:  [id number]          Height:       61.0 in Accession #:    [id number]         Weight:       185.2 lb Date of Birth:  [birth year]          BSA:          1.828 m Patient Age:    66 years           BP:           93/74 mmHg Patient Gender: F                  HR:           106 bpm. Exam Location:  Inpatient Procedure: Limited Echo, Color Doppler, Limited Color Doppler and Saline            Contrast Bubble Study Indications:    Increased O2 requirement  History:  Patient has prior history of Echocardiogram examinations, most                 recent 11/09/2022. CHF, CAD; Risk Factors:Diabetes and                 Dyslipidemia. Shock, Acute Pulmonary Edema,.  Sonographer:    Milbert Coulter Referring Phys: [id number] Vinnie Level SMITH IMPRESSIONS  1. Poor acoustical windows prohibit estimation of overall EF which appears reduced. The Left ventricular endocardial border not optimally defined to evaluate regional wall motion.  2. Right ventricular systolic function is normal. The right ventricular size is normal.  3. The mitral valve is normal in structure. No evidence of mitral valve regurgitation. No evidence of mitral stenosis. Moderate mitral annular calcification.  4. The aortic  valve is normal in structure. Aortic valve regurgitation is mild. No aortic stenosis is present. Aortic valve mean gradient measures 11.0 mmHg. Aortic valve Vmax measures 2.30 m/s.  5. The inferior vena cava is normal in size with greater than 50% respiratory variability, suggesting right atrial pressure of 3 mmHg.  6. Agitated saline contrast bubble study was positive with shunting observed within 3-6 cardiac cycles suggestive of interatrial shunt. There is a patent foramen ovale with bidirectional shunting across atrial septum. FINDINGS  Left Ventricle: Poor acoustical windows prohibit estimation of overall EF which appears reduced. Left ventricular endocardial border not optimally defined to evaluate regional wall motion. The left ventricular internal cavity size was normal in size. There is no left ventricular hypertrophy. Right Ventricle: The right ventricular size is normal. No increase in right ventricular wall thickness. Right ventricular systolic function is normal. Left Atrium: Left atrial size was normal in size. Right Atrium: Right atrial size was normal in size. Pericardium: There is no evidence of pericardial effusion. Mitral Valve: The mitral valve is normal in structure. Moderate mitral annular calcification. No evidence of mitral valve stenosis. Tricuspid Valve: The tricuspid valve is normal in structure. Tricuspid valve regurgitation is mild . No evidence of tricuspid stenosis. Aortic Valve: The aortic valve is normal in structure. Aortic valve regurgitation is mild. No aortic stenosis is present. Aortic valve mean gradient measures 11.0 mmHg. Aortic valve peak gradient measures 21.2 mmHg. Pulmonic Valve: The pulmonic valve was normal in structure. Pulmonic valve regurgitation is not visualized. No evidence of pulmonic stenosis. Aorta: The aortic root is normal in size and structure. Venous: The inferior vena cava is normal in size with greater than 50% respiratory variability, suggesting right  atrial pressure of 3 mmHg. IAS/Shunts: No atrial level shunt detected by color flow Doppler. Agitated saline contrast was given intravenously to evaluate for intracardiac shunting. Agitated saline contrast bubble study was positive with shunting observed within 3-6 cardiac cycles suggestive of interatrial shunt. AORTIC VALVE AV Vmax:           230.00 cm/s AV Vmean:          155.000 cm/s AV VTI:            0.336 m AV Peak Grad:      21.2 mmHg AV Mean Grad:      11.0 mmHg LVOT Vmax:         121.00 cm/s LVOT Vmean:        75.500 cm/s LVOT VTI:          0.164 m LVOT/AV VTI ratio: 0.49 TRICUSPID VALVE TR Peak grad:   53.6 mmHg TR Vmax:        366.00 cm/s  SHUNTS Systemic VTI:  0.16 m Armanda Magic MD Electronically signed by Armanda Magic MD Signature Date/Time: 12/09/2022/4:16:28 PM    Final    VAS [place] LOWER EXTREMITY VENOUS (DVT)  Result Date: 12/09/2022  Lower Venous DVT Study Patient Name:  Katherine [name]  Date of Exam:   12/09/2022 Medical Rec #: [id number]           Accession #:    [id number] Date of Birth: [birth year]           Patient Gender: F Patient Age:   32 years Exam Location:  [facility] Procedure:      VAS [place] LOWER EXTREMITY VENOUS (DVT) Referring Phys: Levon Hedger --------------------------------------------------------------------------------  Indications: Pain in bilateral lower extremities.  Risk Factors: Known history of PAD. Limitations: Patient pain - unable to tolerate compressions at some segments. Comparison Study: 08-28-2021 Prior bilateral lower extremity venous study was                   negative for DVT. Performing Technologist: Jean Rosenthal RDMS, RVT  Examination Guidelines: A complete evaluation includes B-mode imaging, spectral Doppler, color Doppler, and power Doppler as needed of all accessible portions of each vessel. Bilateral testing is considered an integral part of a complete examination. Limited examinations for reoccurring indications may be performed as noted. The  reflux portion of the exam is performed with the patient in reverse Trendelenburg.  +---------+---------------+---------+-----------+---------------+--------------+ RIGHT    CompressibilityPhasicitySpontaneityProperties     Thrombus Aging +---------+---------------+---------+-----------+---------------+--------------+ CFV      Full           No       Yes                                      +---------+---------------+---------+-----------+---------------+--------------+ SFJ      Full                                                             +---------+---------------+---------+-----------+---------------+--------------+ FV Prox  Full                                                             +---------+---------------+---------+-----------+---------------+--------------+ FV Mid                  No       Yes        Patent by color               +---------+---------------+---------+-----------+---------------+--------------+ FV Distal               No       Yes        Patent by color               +---------+---------------+---------+-----------+---------------+--------------+ PFV      Full                                                             +---------+---------------+---------+-----------+---------------+--------------+  POP      Full           No       Yes                                      +---------+---------------+---------+-----------+---------------+--------------+ PTV      Full                                                             +---------+---------------+---------+-----------+---------------+--------------+ PERO     Full                                                             +---------+---------------+---------+-----------+---------------+--------------+   +---------+---------------+---------+-----------+---------------+--------------+ LEFT     CompressibilityPhasicitySpontaneityProperties     Thrombus Aging  +---------+---------------+---------+-----------+---------------+--------------+ CFV      Full           No       Yes                                      +---------+---------------+---------+-----------+---------------+--------------+ SFJ      Full                                                             +---------+---------------+---------+-----------+---------------+--------------+ FV Prox  Full                                                             +---------+---------------+---------+-----------+---------------+--------------+ FV Mid                  No       Yes        Patent by color               +---------+---------------+---------+-----------+---------------+--------------+ FV Distal               No       Yes        Patent by color               +---------+---------------+---------+-----------+---------------+--------------+ PFV      Full                                                             +---------+---------------+---------+-----------+---------------+--------------+ POP      Full           No  Yes        Patent by color               +---------+---------------+---------+-----------+---------------+--------------+ PTV      Full                                                             +---------+---------------+---------+-----------+---------------+--------------+ PERO     Full                                                             +---------+---------------+---------+-----------+---------------+--------------+     Summary: RIGHT: - There is no evidence of deep vein thrombosis in the lower extremity. However, portions of this examination were limited- see technologist comments above.  - No cystic structure found in the popliteal fossa.  LEFT: - There is no evidence of deep vein thrombosis in the lower extremity. However, portions of this examination were limited- see technologist comments above.  - No cystic  structure found in the popliteal fossa.  *See table(s) above for measurements and observations. Electronically signed by Sherald Hess MD on 12/09/2022 at 1:12:41 PM.    Final    DG Chest Port 1 View  Result Date: 12/09/2022 CLINICAL DATA:  Dyspnea EXAM: PORTABLE CHEST 1 VIEW COMPARISON:  12/07/2022 FINDINGS: Right jugular central line is again seen. Cardiac shadow is mildly enlarged. Postsurgical changes are seen. Right-sided IV is noted in the upper arm. Slight increase in central vascular congestion is noted. Interstitial changes are again seen bilaterally slightly worsened on the left likely representing edema given the abrupt onset. IMPRESSION: Increasing interstitial changes likely related to mild CHF. Electronically Signed   By: Alcide Clever M.D.   On: 12/09/2022 09:38   US Renal Transplant w/Doppler  Result Date: 12/07/2022 CLINICAL DATA:  Urinary tract infection EXAM: ULTRASOUND OF RENAL TRANSPLANT WITH RENAL DOPPLER ULTRASOUND TECHNIQUE: Ultrasound examination of the renal transplant was performed with gray-scale, color and duplex doppler evaluation. COMPARISON:  CT pelvis 10/19/2021 FINDINGS: Transplant kidney location: Left pelvis Transplant Kidney: Renal measurements: 11.3 x 5.8 x 5.7 cm = volume: . Normal in size and parenchymal echogenicity. No evidence of mass or hydronephrosis. No peri-transplant fluid collection seen. Color flow in the main renal artery:  Yes Color flow in the main renal vein:  Yes Duplex Doppler Evaluation: Main Renal Artery Velocity: 81 cm/sec Main Renal Artery Resistive Index: 0.8 Venous waveform in main renal vein:  Present Intrarenal resistive index in upper pole:  0.7 (normal 0.6-0.8; equivocal 0.8-0.9; abnormal >= 0.9) Intrarenal resistive index in lower pole: 0.5 (normal 0.6-0.8; equivocal 0.8-0.9; abnormal >= 0.9) Bladder: Normal for degree of bladder distention. Other findings:  None. IMPRESSION: Normal ultrasound evaluation of left pelvic transplant  kidney. Electronically Signed   By: Burman Nieves M.D.   On: 12/07/2022 21:58   US RENAL  Result Date: 12/07/2022 CLINICAL DATA:  UTI.  Left pelvic renal transplant. EXAM: RENAL / URINARY TRACT ULTRASOUND COMPLETE COMPARISON:  04/19/2022. FINDINGS: Right Kidney: Not well delineated. Left Kidney: Not well delineated. Bladder: Appears normal for degree of bladder distention. Prevoid bladder volume is 492 mL. Postvoid bladder  volume could not be obtained. Other: None. IMPRESSION: 1. Native kidneys are not well delineated on exam. 2. Normal examination of the urinary bladder. Electronically Signed   By: Thornell Sartorius M.D.   On: 12/07/2022 21:52   DG CHEST PORT 1 VIEW  Result Date: 12/07/2022 CLINICAL DATA:  Central venous catheter placement EXAM: PORTABLE CHEST 1 VIEW COMPARISON:  Previous studies including the examination done earlier today FINDINGS: There is interval placement of right IJ central venous catheter with its tip in superior vena cava. Transverse diameter of heart is increased. There is previous coronary bypass surgery. There is slight prominence of interstitial markings in right lung, possibly scarring. There are no signs of alveolar pulmonary edema or focal pulmonary consolidation. There is no significant pleural effusion or pneumothorax. IMPRESSION: Tip of right IJ central venous catheter is seen in superior vena cava. There is no pneumothorax. Electronically Signed   By: Ernie Avena M.D.   On: 12/07/2022 16:53   [place] EKG SITE RITE  Result Date: 12/07/2022 If [facility] image not attached, placement could not be confirmed due to current cardiac rhythm.  DG Chest Port 1 View  Result Date: 12/07/2022 CLINICAL DATA:  67 year old female acute respiratory failure. EXAM: PORTABLE CHEST 1 VIEW COMPARISON:  Portable chest 12/14/2022 and earlier. FINDINGS: Portable AP semi upright view at 0525 hours. Cardiomegaly. Prior CABG. Stable cardiac size and mediastinal contours. Visualized  tracheal air column is within normal limits. Stable lung volumes. Stable lung markings since last year, no convincing pulmonary edema. No pneumothorax, pleural effusion or confluent opacity. No acute osseous abnormality identified. Paucity of bowel gas the visible abdomen. IMPRESSION: Chronic cardiomegaly, CABG.  No acute cardiopulmonary abnormality. Electronically Signed   By: Odessa Fleming M.D.   On: 12/07/2022 06:19   DG Chest Portable 1 View  Result Date: 12/15/2022 CLINICAL DATA:  Chest pain. EXAM: PORTABLE CHEST 1 VIEW COMPARISON:  Chest radiograph dated 11/28/2022. FINDINGS: The heart is enlarged. Vascular calcifications are seen in the aortic arch. Median sternotomy wires are redemonstrated with multiple wire fractures. Mild diffuse bilateral interstitial opacities are noted. There is mild bibasilar atelectasis. No pleural effusion or pneumothorax. Degenerative changes are seen in the spine. IMPRESSION: 1. Mild diffuse bilateral interstitial opacities, which may represent pulmonary edema or atypical infection superimposed on chronic interstitial lung disease. 2. Cardiomegaly. Electronically Signed   By: Romona Curls M.D.   On: 11/30/2022 12:53   DG Chest Port 1 View  Result Date: 11/28/2022 CLINICAL DATA:  Cough. EXAM: PORTABLE CHEST 1 VIEW COMPARISON:  11/26/2022 FINDINGS: Status post median sternotomy and CABG procedure. Stable mild cardiac enlargement. Diffuse reticular interstitial opacities are identified throughout both lungs compatible with chronic interstitial lung disease. No sign of pleural effusion. No superimposed airspace consolidation. Visualized osseous structures are unremarkable. IMPRESSION: 1. Chronic interstitial lung disease. 2. Cardiac enlargement.  Status post CABG procedure. Electronically Signed   By: Signa Kell M.D.   On: 11/28/2022 09:08   DG CHEST PORT 1 VIEW  Result Date: 11/26/2022 CLINICAL DATA:  shortness of breath EXAM: PORTABLE CHEST 1 VIEW . COMPARISON:   11/23/2022 and CT 11/24/2022 FINDINGS: Previous median sternotomy and CABG procedure. Stable cardiomediastinal contours. Reticular interstitial markings within both lungs compatible with chronic interstitial lung disease. Mild peripheral increased septal markings within the lower lung zones may reflect trace interstitial edema. Blunting of the costophrenic angles compatible with small effusions. Visualized osseous structures are unremarkable. IMPRESSION: 1. Suspect trace interstitial edema. 2. Small bilateral pleural effusions. 3. Chronic interstitial  lung disease. Electronically Signed   By: Signa Kell M.D.   On: 11/26/2022 11:00   CT CHEST WO CONTRAST  Result Date: 11/24/2022 CLINICAL DATA:  Dyspnea.  Follow-up from chest radiograph. EXAM: CT CHEST WITHOUT CONTRAST TECHNIQUE: Multidetector CT imaging of the chest was performed following the standard protocol without IV contrast. RADIATION DOSE REDUCTION: This exam was performed according to the departmental dose-optimization program which includes automated exposure control, adjustment of the mA and/or kV according to patient size and/or use of iterative reconstruction technique. COMPARISON:  Chest CT, 08/05/2021.  Chest radiograph, 11/23/2022. FINDINGS: Cardiovascular: Heart is normal in size. Dense three-vessel coronary artery calcifications. No pericardial effusion. Previous CABG surgery. Great vessels normal in caliber.  Aortic atherosclerosis. Mediastinum/Nodes: No neck base, mediastinal or hilar masses. No enlarged lymph nodes. Trachea and esophagus are unremarkable. Lungs/Pleura: Small pleural effusions. Bilateral irregular interstitial thickening. Patchy intervening areas of ground-glass opacity. Associated architectural distortion. No lung mass or suspicious nodule.  No pneumothorax. Upper Abdomen: Well positioned lap band.  No acute findings. Musculoskeletal: No fracture or acute finding. No bone lesion. No chest wall mass. IMPRESSION: 1. Findings  of interstitial lung disease with bilateral areas of interstitial thickening and associated architectural distortion with intervening ground-glass opacities. 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 2. Small effusions. Cannot exclude a component of underlying interstitial pulmonary edema. 3. Previous CABG surgery, coronary artery calcifications and aortic atherosclerosis. Aortic Atherosclerosis (ICD10-I70.0). Electronically Signed   By: Amie Portland M.D.   On: 11/24/2022 12:45   DG Chest Portable 1 View  Result Date: 11/23/2022 CLINICAL DATA:  Shortness of breath EXAM: PORTABLE CHEST 1 VIEW COMPARISON:  11/19/2022, CT 08/05/2021, chest x-ray 12/07/2021 FINDINGS: Post sternotomy changes. Cardiomegaly.diffuse reticular opacities bilaterally consistent with underlying chronic lung disease. Slight increased hazy pulmonary density may reflect superimposed edema or inflammatory process. No pleural effusion or pneumothorax. Aortic atherosclerosis IMPRESSION: Mild diffuse reticular opacities suspicious for underlying chronic lung disease. Cardiomegaly with slight increased hazy pulmonary density may reflect superimposed edema or inflammatory process. Electronically Signed   By: Jasmine Pang M.D.   On: 11/23/2022 19:03   DG Chest 2 View  Result Date: 11/19/2022 CLINICAL DATA:  Dyspnea EXAM: CHEST - 2 VIEW COMPARISON:  11/11/2022 FINDINGS: Sternotomy and CABG. Stable cardiomegaly. Bilateral airspace and interstitial opacities. No definite pleural effusion. No pneumothorax. No acute osseous abnormality. IMPRESSION: Cardiomegaly and diffuse pulmonary edema, worsened from 11/11/2022. Electronically Signed   By: Minerva Fester M.D.   On: 11/19/2022 17:34    Microbiology: Recent Results (from the past 240 hour(s))  SARS Coronavirus 2 by RT PCR (hospital order, performed in [facility] hospital lab) *cepheid single result test* Anterior Nasal Swab      Status: None   Collection Time: 11/29/2022 12:38 PM   Specimen: Anterior Nasal Swab  Result Value Ref Range Status   SARS Coronavirus 2 by RT PCR NEGATIVE NEGATIVE Final    Comment: Performed at [facility], [address], [place], [place] [address]  MRSA Next Gen by PCR, Nasal     Status: Abnormal   Collection Time: 12/11/2022  1:39 PM   Specimen: Nasal Mucosa; Nasal Swab  Result Value Ref Range Status   MRSA by PCR Next Gen DETECTED (A) NOT DETECTED Final    Comment: RESULT CALLED TO, READ BACK BY AND VERIFIED WITH:  C/ Kristen Loader, RN 12/03/2022 1937 A. [place] (NOTE) The GeneXpert MRSA Assay (FDA approved  for NASAL specimens only), is one component of a comprehensive MRSA colonization surveillance program. It is not intended to diagnose MRSA infection nor to guide or monitor treatment for MRSA infections. Test performance is not FDA approved in patients less than 86 years old. Performed at [facility], [address] [place], [place] [address]   Blood culture (routine x 2)     Status: None   Collection Time: 11/19/2022  2:33 PM   Specimen: BLOOD  Result Value Ref Range Status   Specimen Description BLOOD RIGHT ANTECUBITAL  Final   Special Requests   Final    BOTTLES DRAWN AEROBIC AND ANAEROBIC Blood Culture adequate volume   Culture   Final    NO GROWTH 5 DAYS Performed at [facility], [address], [place], [place] [address]    Report Status 12/11/2022 FINAL  Final  Blood culture (routine x 2)     Status: None   Collection Time: 12/03/2022  6:05 PM   Specimen: BLOOD LEFT HAND  Result Value Ref Range Status   Specimen Description BLOOD LEFT HAND  Final   Special Requests   Final    BOTTLES DRAWN AEROBIC AND ANAEROBIC Blood Culture adequate volume   Culture   Final    NO GROWTH 5 DAYS Performed at [facility], [address] [place], [place] [address]    Report Status 12/11/2022 FINAL  Final  Urine Culture (for pregnant, neutropenic or urologic  patients or patients with an indwelling urinary catheter)     Status: None   Collection Time: 12/07/22  3:20 PM   Specimen: Urine, Clean Catch  Result Value Ref Range Status   Specimen Description URINE, CLEAN CATCH  Final   Special Requests NONE  Final   Culture   Final    NO GROWTH Performed at [facility], [address] [place], [place] [address]    Report Status 12/08/2022 FINAL  Final    Time spent: 0 minutes  Signed: Dorcas Carrow, MD 12/05/2022

## 2022-12-16 NOTE — Progress Notes (Signed)
Daily Progress Note   Patient Name: Katherine [name]       Date: 11/30/2022 DOB: 1956-03-27  Age: 67 y.o. MRN#: [id number] Attending Physician: Dorcas Carrow, MD Primary Care Physician: [name], MD Admit Date: 12/04/2022  Reason for Consultation/Follow-up: Establishing goals of care  Patient Profile/HPI:   67 y.o. female  with past medical history of chronic respiratory failure- on 7L oxygen at home, CHF with reduced EF, CAD, PAD, renal transplant, COPD, ILD, a-fib  admitted on 11/18/2022 with chest pain and weakness. She is admitted and being treated for shock- cardiogenic vs adrenal insufficiency, UTI, tachycardia, interstitial lung disease. Palliative medicine consulted for GOC discussion.    Subjective: Chart reviewed, updates received from RN.  Ms. Skalsky resting in bed. Some increased work of breathing and moaning noted. Her niece, [name] is present states patient has been sleeping more today. Shares all family has visited that patient desired to see. [name] feels patient is worsening and would like to consider transitioning to full comfort measures.   Patient awakens with verbal stimuli. She is able to state her name. Denies pain but does endorse dyspnea with obvious use of accessory muscles. Sherry and myself discussed comfort measures and weaning off of HFNC with patient. Education provided on use of medications to aggressively manage symptoms. Ms. Serda expresses her wishes to proceed with comfort focused care. She is requesting [name] to make final decisions as she is tired and easily fatigues when attempting to speak.   I discussed at length with [name] transitioning to full comfort and weaning process from high flow. Patient and family are very apprehensive about this  process due to previous experience. [name] does not wish for patient to suffer or experience significant respiratory distress/discomfort during her last moments.   We discussed what the weaning process would look like and use of Propofol for palliative end-of-life sedation to allow for peaceful transition and minimize symptom burden. Risk and benefits of use of Propofol at end-of-life was discussed at length. [name] verbalized her desire to proceed with use of Propofol to allow for full comfort with awareness patient would most likely continue to be in a lethargic yet comfortable state. Perlie Mayo patient's HCPOA again confirms her verbal agreement to proceed with palliative sedation.   Education provided on the need to transfer patient back  to unit to allow use of palliative sedation. [name] verbalized understanding and agreement. Discussed with attending Dr. Jerral Ralph, Dr. Drucie Ip (PCCM), ICU charge, House coverage [facility], and Dr. Phillips [facility] regarding plans as confirmed by family. Patient approved for transfer. RN updated on plans as well as 3E charge and pharmacy. Patient successfully transferred to 31M. Weaned appropriately, and tolerating Propofol. At bedside she is comfortable at rest. No distress noted. RN provided updates.    Review of Systems  Respiratory:  Positive for shortness of breath.    Physical Exam Vitals and nursing note reviewed.  Constitutional:      General: She is in acute distress.     Appearance: She is ill-appearing.  Cardiovascular:     Rate and Rhythm: Tachycardia present.  Pulmonary:     Comments: On HHFNC, increased work of breathing, use of accessory muscles             Vital Signs: BP 97/78 (BP Location: Right Wrist)   Pulse (!) 111   Temp 97.6 F (36.4 C) (Oral)   Resp 11   Ht 5\' 1"  (1.549 m)   Wt 86.9 kg   SpO2 (!) 75%   BMI 36.20 kg/m  SpO2: SpO2: (!) 75 % O2 Device: O2 Device: (S) Nasal Cannula O2 Flow Rate: O2 Flow Rate  (L/min): (S) 3 L/min  Intake/output summary:  Intake/Output Summary (Last 24 hours) at 12/15/2022 1603 Last data filed at 12/10/2022 1500 Gross per 24 hour  Intake 3.55 ml  Output 150 ml  Net -146.45 ml    LBM: Last BM Date :  (UTA.) Baseline Weight: Weight: 84.6 kg Most recent weight: Weight: 86.9 kg       Palliative Assessment/Data: PPS: 10%      Patient Active Problem List   Diagnosis Date Noted   Acute pulmonary edema (HCC) 12/09/2022   Acute on chronic HFrEF (heart failure with reduced ejection fraction) (HCC) 12/09/2022   Acute on chronic hypoxic respiratory failure (HCC) 12/09/2022   Shock (HCC) 12/11/2022   Pressure injury of skin 11/07/2022   Diuretic-induced hypokalemia 06/17/2022   Insulin-requiring or dependent type II diabetes mellitus (HCC) 06/17/2022   Spondylosis of lumbar region without myelopathy or radiculopathy 12/19/2021   Encounter for palliative care involving management of pain 12/19/2021   COPD with acute exacerbation (HCC) 12/07/2021   Goals of care, counseling/discussion 10/03/2021   Class 2 obesity 09/25/2021   Longstanding persistent atrial fibrillation (HCC)    Chronic combined systolic and diastolic heart failure (HCC) 08/06/2021   Obesity (BMI 30-39.9) 08/05/2021   Polymorphic ventricular tachycardia    Bradycardia    COPD (chronic obstructive pulmonary disease) (HCC)    History of renal transplant    Coronary stent restenosis due to scar tissue    Chronic respiratory failure with hypoxia (HCC) 01/01/2021   Candidal skin infection 08/13/2020   Chronic bronchitis, mucopurulent (HCC) 08/24/2019   Current chronic use of systemic steroids 07/23/2019   OSA (obstructive sleep apnea)    Severe episode of recurrent major depressive disorder, with psychotic features (HCC) 02/16/2019   Obstructive chronic bronchitis without exacerbation 11/03/2018   Long term (current) use of antithrombotics/antiplatelets 04/28/2018   LBBB (left bundle branch  block) 10/07/2017   Primary osteoarthritis of right knee 08/18/2017   Mild aortic stenosis by prior echocardiogram 01/28/2017   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   Normocytic anemia 10/13/2016   Fibromyalgia 03/30/2016   Other spondylosis with radiculopathy, lumbar region 03/30/2016   Type 2 diabetes mellitus with hyperlipidemia (  HCC) 03/30/2016   Paroxysmal atrial fibrillation (HCC); CHA2DS2VASc score F, HTN, CAD, CVA = 5 06/28/2014   Debility 06/28/2014   CAD S/P percutaneous coronary angioplasty - PCI x 5 to SVG-D1 09/11/2013   Essential hypertension    PAD (peripheral artery disease) (HCC) 08/17/2013   CAD (coronary artery disease) of bypass graft 10/08/2012   Stenosis of right carotid artery without infarction 10/08/2012   Dyslipidemia, goal LDL below 70 10/08/2012   Mitral annular calcification 10/08/2012   Renal transplant disorder 06/12/2012   Chronic allergic rhinitis 04/29/2011   Extrinsic asthma 09/09/2007   GERD 09/09/2007   Morbid obesity - s/p Lap Band 9/'05 05/07/2004    Palliative Care Assessment & Plan    Assessment/Recommendations/Plan  Transition to comfort measures only Minimize medications. Will discontinue medications not focused on comfort. Unable to take oral medications due to current state and respiratory status. Transfer to SD for Palliative sedation.  Propofol drip with titration orders at end-of-life. Sherry (HCPOA/Niece) verbalized understanding of palliative sedation, risk and benefits, expectations of use. Verbalized agreement confirming wishes to proceed with propofol aggressively managing symptoms.  PMT will continue to follow- continue to recommend propofol sedation if she is awake and becomes ready to titrate of HF oxygen Recommend titrating HF oxygen to nasal cannula.  Morphine PRN for pain/air hunger/comfort Robinul PRN for excessive secretions Ativan PRN for agitation/anxiety Zofran PRN for nausea Liquifilm tears PRN for dry eyes Haldol PRN for  agitation/anxiety Comfort cart for family Unrestricted visitations in the setting of EOL (per policy) Oxygen PRN 2L or less for comfort. No escalation.     Code Status: DNR  Prognosis:  Hours - Days   Discharge Planning: Anticipated Hospital Death   Care plan was discussed with patient, patient's niece, and care team.   Thank you for allowing the Palliative Medicine Team to assist in the care of this patient.  Time: 120 min   Any controlled substances utilized were prescribed in the context of palliative care.    Visit consisted of counseling and education dealing with the complex and emotionally intense issues of symptom management and palliative care in the setting of serious and potentially life-threatening illness.Greater than 50%  of this time was spent counseling and coordinating care related to the above assessment and plan.  Willette Alma, AGPCNP-BC  Palliative Medicine Team/[facility]  *Please note that this is a verbal dictation therefore any spelling or grammatical errors are due to the "[employer]agon [employer] One" system interpretation.

## 2022-12-16 NOTE — Progress Notes (Signed)
PROGRESS NOTE    Katherine [name]  YNW:[id number] DOB: 03-03-1956 DOA: 12/05/2022 PCP: [name], MD    Brief Narrative:  67 year old female with history of chronic respiratory failure on 7 L oxygen at home, chronic systolic heart failure, coronary artery disease, peripheral arterial disease, renal transplant, interstitial lung disease, A-fib who was admitted to the intensive care unit since 12/04/2022 with chest pain, weakness.  She was found by EMS with heart rate of 140, left bundle branch block.  Blood pressures were low, she was started on vasopressors, started on vancomycin and cefepime and admitted to the intensive care unit. 4/23, she was able to come off vasopressors but she had worsening oxygen requirement. Seen by cardiology on arrival, initially brought in as code STEMI however ruled out. 4/25, palliative care following, transition to comfort care.  Moved to the medical floor on 4/26.   Assessment & Plan:   Acute on chronic hypoxemic respiratory failure, chronic hypoxic respiratory around 7 L at home. Interstitial lung disease, COPD and pulmonary edema. CKD stage IIIb with history of renal transplant on chronic prednisone therapy Chronic A-fib Acute on chronic congestive heart failure with reduced ejection fraction CAD with history of CABG and multiple stenting, ischemic cardiomyopathy Uncontrolled type 2 diabetes Severe systemic illness, very high oxygen requirement, lethargic and failure to thrive.  Patient transition to comfort care and hospice level of care in ICU 4/26. All symptom control medications are available. Desired to continue amiodarone, Eliquis, aspirin, Imuran, Plavix along with prednisone as long as she can take it. Currently remains on high flow nasal cannula oxygen and lethargic.  Waiting for some family members to arrive to wean off to nasal cannula. Continue to use low-dose morphine or Ativan as needed for respiratory distress or  comfort. Unrestricted visitation policy. RN to pronounce death if happens in the hospital. Anticipate hospital death.   DVT prophylaxis: SCDs Start: 11/22/2022 1515 apixaban (ELIQUIS) tablet 5 mg   Code Status: Comfort care Family Communication: None at the bedside Disposition Plan: Status is: Inpatient Remains inpatient appropriate because: End-of-life care     Consultants:  Critical care Cardiology Palliative care  Procedures:  None  Antimicrobials:  Completed   Subjective: Patient seen in the morning rounds.  She was moaning only.  She did not respond or woke up for conversation.  Since she was looking comfortable laying in the bed and closing her eyes, did not disturb her.  RT ready to give her breathing treatments and Pulmicort.  Objective: Vitals:   12/11/22 2104 12/11/2022 0336 11/16/2022 0507 12/03/2022 0855  BP:    102/76  Pulse: (!) 117 (!) 123  (!) 120  Resp: (!) 21 20  20   Temp:    98 F (36.7 C)  TempSrc:    Axillary  SpO2:    95%  Weight:   86.9 kg   Height:        Intake/Output Summary (Last 24 hours) at 11/29/2022 0953 Last data filed at 12/11/2022 0002 Gross per 24 hour  Intake --  Output 150 ml  Net -150 ml   Filed Weights   12/09/22 0500 12/10/22 0415 11/19/2022 0507  Weight: 84 kg 82.6 kg 86.9 kg    Examination:  General: Sick looking, laying in bed, on high flow nasal cannula oxygen.  Looks fairly comfortable at this time. Cardiovascular: S1-S2 normal.  Irregularly irregular.  Tachycardic. Respiratory: Bilateral poor air entry. Gastrointestinal: Soft.  Pendulous. Ext: No edema. Neuro: Alert on stimulation.  Answers simple questions.  Looks lethargic.     Data Reviewed: I have personally reviewed following labs and imaging studies  CBC: Recent Labs  Lab 11/30/2022 1805 12/07/22 0113 12/08/22 0308 12/08/22 1257 12/09/22 0415 12/09/22 1118 12/10/22 0303  WBC 10.6* 11.5* 6.7  --  9.6  --  12.0*  NEUTROABS 8.5*  --   --   --   --    --   --   HGB 12.7 11.0* 8.2* 9.5* 8.0* 9.2* 8.5*  HCT 41.0 35.5* 25.9* 28.0* 26.2* 27.0* 27.3*  MCV 93.8 92.9 91.2  --  94.2  --  92.2  PLT 316 284 168  179  --  179  --  194   Basic Metabolic Panel: Recent Labs  Lab 12/03/2022 1230 12/04/2022 1252 12/07/22 0113 12/08/22 0000 12/08/22 0308 12/08/22 1200 12/08/22 1257 12/09/22 0415 12/09/22 1118 12/10/22 0303  NA 133*   < > 133*  --  131* 133* 136 136 137 134*  K 4.9   < > 3.5  --  3.1* 3.9 3.9 3.5 4.1 4.6  CL 96*  --  97*  --  97* 101  --  103  --  99  CO2 21*  --  22  --  22 21*  --  25  --  24  GLUCOSE 340*  --  131* 486* 369* 242*  --  223*  --  304*  BUN 22  --  24*  --  19 20  --  17  --  21  CREATININE 1.51*  --  1.66*  --  1.31* 1.14*  --  1.16*  --  1.23*  CALCIUM 9.1  --  8.9  --  8.7* 9.4  --  9.0  --  9.3  MG 1.4*  --  2.0  --  1.7  --   --  2.0  --  1.7  PHOS  --   --  4.1  --  3.4  --   --   --   --   --    < > = values in this interval not displayed.   GFR: Estimated Creatinine Clearance: 45.6 mL/min (A) (by C-G formula based on SCr of 1.23 mg/dL (H)). Liver Function Tests: Recent Labs  Lab 12/15/2022 1230  AST 29  ALT 12  ALKPHOS 149*  BILITOT 0.8  PROT 7.0  ALBUMIN 2.8*   Recent Labs  Lab 11/29/2022 1230  LIPASE 18   Recent Labs  Lab 12/03/2022 1233  AMMONIA 38*   Coagulation Profile: Recent Labs  Lab 11/29/2022 1230 12/08/22 0308  INR 1.6* 2.2*   Cardiac Enzymes: No results for input(s): "CKTOTAL", "CKMB", "CKMBINDEX", "TROPONINI" in the last 168 hours. BNP (last 3 results) No results for input(s): "PROBNP" in the last 8760 hours. HbA1C: No results for input(s): "HGBA1C" in the last 72 hours. CBG: Recent Labs  Lab 12/09/22 1106 12/09/22 1630 12/09/22 1740 12/09/22 2215 12/10/22 0634  GLUCAP 279* 451* 438* 323* 313*   Lipid Profile: No results for input(s): "CHOL", "HDL", "LDLCALC", "TRIG", "CHOLHDL", "LDLDIRECT" in the last 72 hours. Thyroid Function Tests: No results for  input(s): "TSH", "T4TOTAL", "FREET4", "T3FREE", "THYROIDAB" in the last 72 hours. Anemia Panel: No results for input(s): "VITAMINB12", "FOLATE", "FERRITIN", "TIBC", "IRON", "RETICCTPCT" in the last 72 hours. Sepsis Labs: Recent Labs  Lab 11/27/2022 1233 12/15/2022 1433 11/25/2022 1805  PROCALCITON  --   --  <0.10  LATICACIDVEN 4.1* 3.3*  --     Recent Results (from the past 240 hour(s))  SARS Coronavirus 2 by RT PCR (hospital order, performed in [facility] hospital lab) *cepheid single result test* Anterior Nasal Swab     Status: None   Collection Time: 12/13/2022 12:38 PM   Specimen: Anterior Nasal Swab  Result Value Ref Range Status   SARS Coronavirus 2 by RT PCR NEGATIVE NEGATIVE Final    Comment: Performed at [facility], [address] [place], [place] [address]  MRSA Next Gen by PCR, Nasal     Status: Abnormal   Collection Time: 12/01/2022  1:39 PM   Specimen: Nasal Mucosa; Nasal Swab  Result Value Ref Range Status   MRSA by PCR Next Gen DETECTED (A) NOT DETECTED Final    Comment: RESULT CALLED TO, READ BACK BY AND VERIFIED WITH:  C/ Kristen Loader, RN 12/05/2022 1937 A. LAFRANCE (NOTE) The GeneXpert MRSA Assay (FDA approved for NASAL specimens only), is one component of a comprehensive MRSA colonization surveillance program. It is not intended to diagnose MRSA infection nor to guide or monitor treatment for MRSA infections. Test performance is not FDA approved in patients less than 25 years old. Performed at [facility], [address], [place], [place] [address]   Blood culture (routine x 2)     Status: None   Collection Time: 11/16/2022  2:33 PM   Specimen: BLOOD  Result Value Ref Range Status   Specimen Description BLOOD RIGHT ANTECUBITAL  Final   Special Requests   Final    BOTTLES DRAWN AEROBIC AND ANAEROBIC Blood Culture adequate volume   Culture   Final    NO GROWTH 5 DAYS Performed at [facility], [address], [place], [place] [address]    Report  Status 12/11/2022 FINAL  Final  Blood culture (routine x 2)     Status: None   Collection Time: 11/23/2022  6:05 PM   Specimen: BLOOD LEFT HAND  Result Value Ref Range Status   Specimen Description BLOOD LEFT HAND  Final   Special Requests   Final    BOTTLES DRAWN AEROBIC AND ANAEROBIC Blood Culture adequate volume   Culture   Final    NO GROWTH 5 DAYS Performed at [facility], [address], [place], [place] [address]    Report Status 12/11/2022 FINAL  Final  Urine Culture (for pregnant, neutropenic or urologic patients or patients with an indwelling urinary catheter)     Status: None   Collection Time: 12/07/22  3:20 PM   Specimen: Urine, Clean Catch  Result Value Ref Range Status   Specimen Description URINE, CLEAN CATCH  Final   Special Requests NONE  Final   Culture   Final    NO GROWTH Performed at [facility], [address], [place], [place] [address]    Report Status 12/08/2022 FINAL  Final         Radiology Studies: No results found.      Scheduled Meds:  amiodarone  200 mg Oral Daily   apixaban  5 mg Oral BID   arformoterol  15 mcg Nebulization BID   aspirin EC  81 mg Oral Daily   azaTHIOprine  125 mg Oral Daily   budesonide  2 mL Nebulization BID   clopidogrel  75 mg Oral Daily   fluticasone  1 spray Each Nare Daily   lamoTRIgine  25 mg Oral QHS   LORazepam  1 mg Intravenous Q6H    morphine injection  4 mg Intravenous Q6H  pantoprazole  40 mg Oral Daily   predniSONE  5 mg Oral Q breakfast   rosuvastatin  20 mg Oral Daily   sertraline  200 mg Oral Daily   Continuous Infusions:   LOS: 6 days    Time spent: 40 minutes    Dorcas Carrow, MD [facility] Pager [phone]

## 2022-12-16 NOTE — Progress Notes (Signed)
Pt Heated HFNC weaned from 55L 94% to 35L 72% for pt comfort per family request. MD aware, RT will monitor as needed.     12/08/2022 1243  Therapy Vitals  Pulse Rate (!) 124  Resp 19  MEWS Score/Color  MEWS Score 4  MEWS Score Color Comfort Care Only  Oxygen Therapy/Pulse Ox  O2 Device HFNC  O2 Therapy Oxygen humidified  Heater temperature 87.8 F (31 C)  O2 Flow Rate (L/min) (S)  35 L/min (Weaned for pt comfort per family request.)  FiO2 (%) (S)  72 %  SpO2 92 %

## 2022-12-16 DEATH — deceased

## 2022-12-21 IMAGING — DX DG CHEST 1V PORT
1 series · 1 of 1 positions shown · non-contrast
Comparison: 11/21/2021

CLINICAL DATA: Shortness of breath

EXAM:
PORTABLE CHEST 1 VIEW

[chest ap]
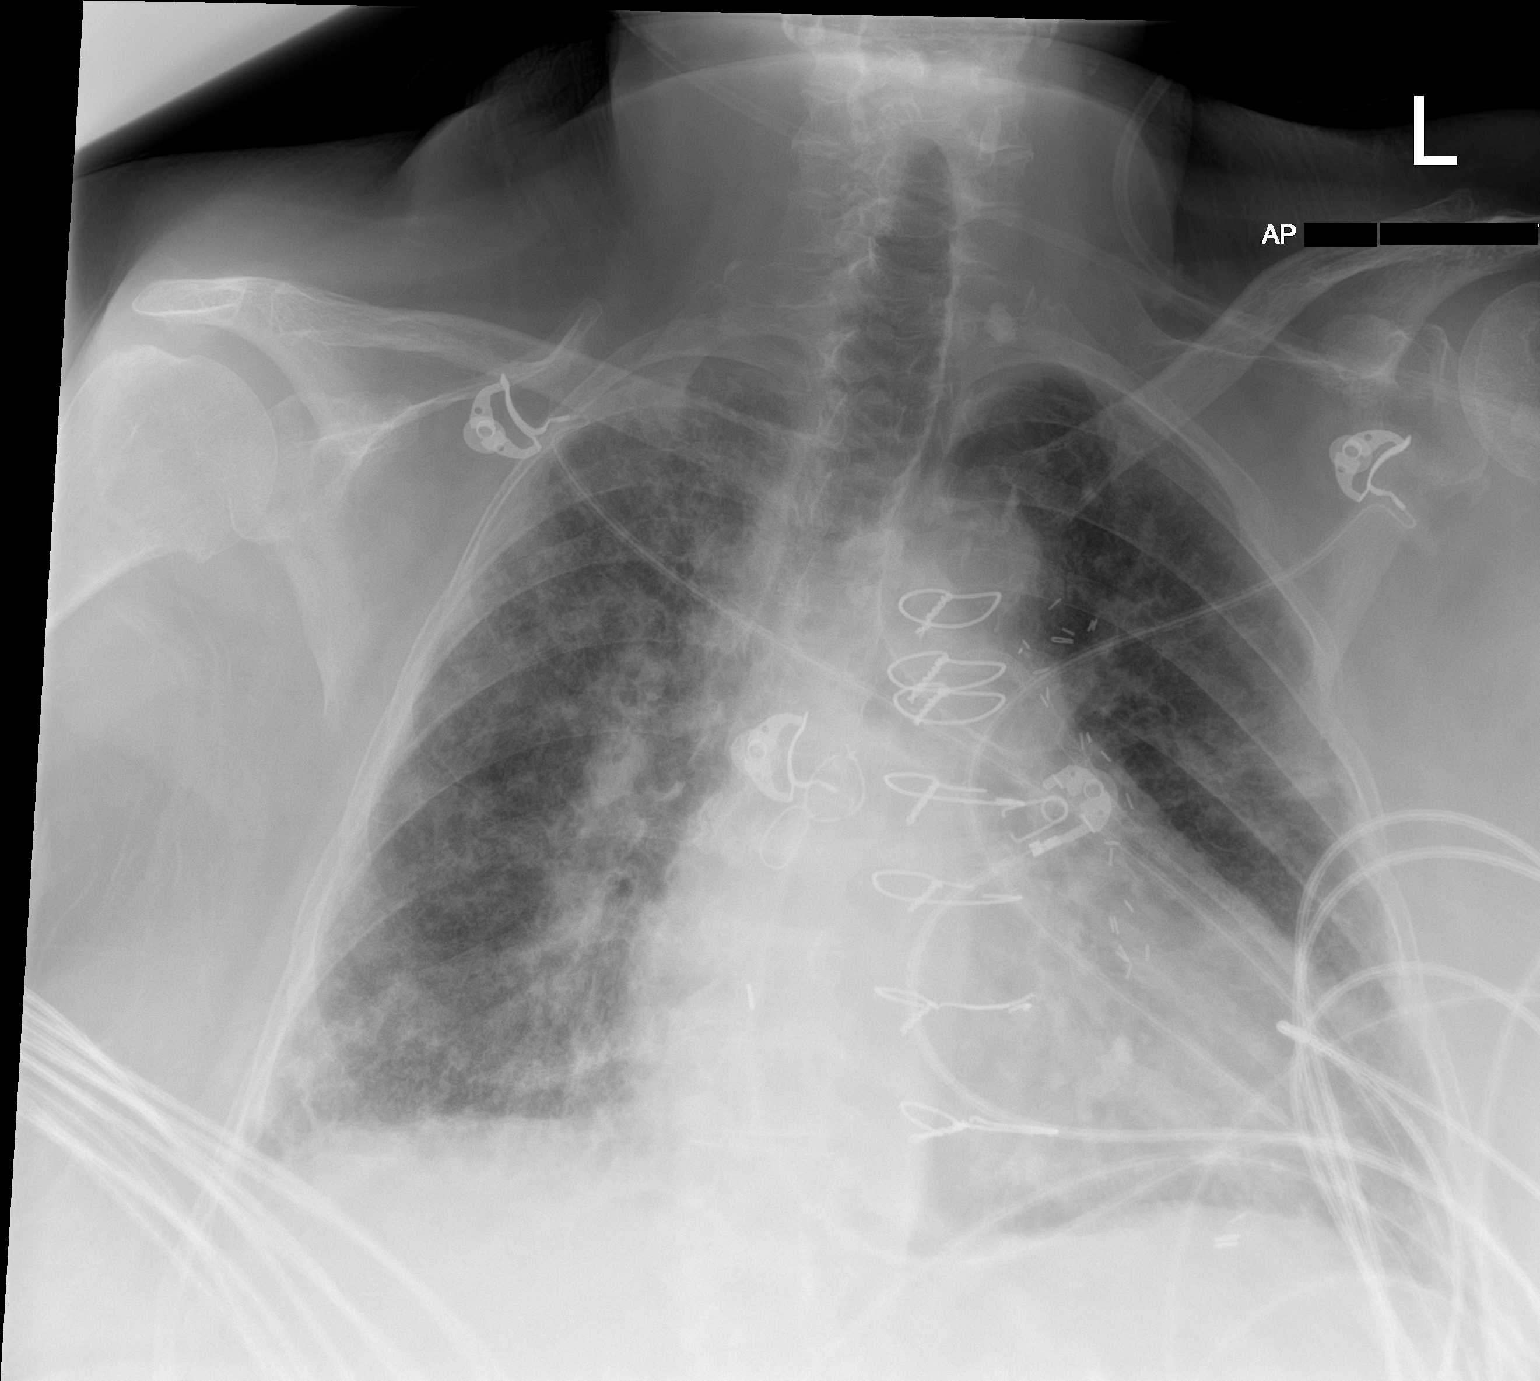

[1 of 1 positions shown; findings below may reference images not displayed]

FINDINGS: Unchanged mild interstitial pulmonary edema and cardiomegaly. No
pneumothorax or sizable pleural effusion. Remote median sternotomy
and CABG.
IMPRESSION: Unchanged cardiomegaly and mild interstitial pulmonary edema.

## 2022-12-30 ENCOUNTER — Institutional Professional Consult (permissible substitution): Payer: Medicare Other | Admitting: Plastic Surgery

## 2023-02-16 IMAGING — DX DG ABDOMEN ACUTE W/ 1V CHEST
3 series · 3 of 3 positions shown · non-contrast
Comparison: December 07, 2021.

CLINICAL DATA: Acute generalized abdominal pain.

EXAM:
DG ABDOMEN ACUTE WITH 1 VIEW CHEST

[abdomen erect]
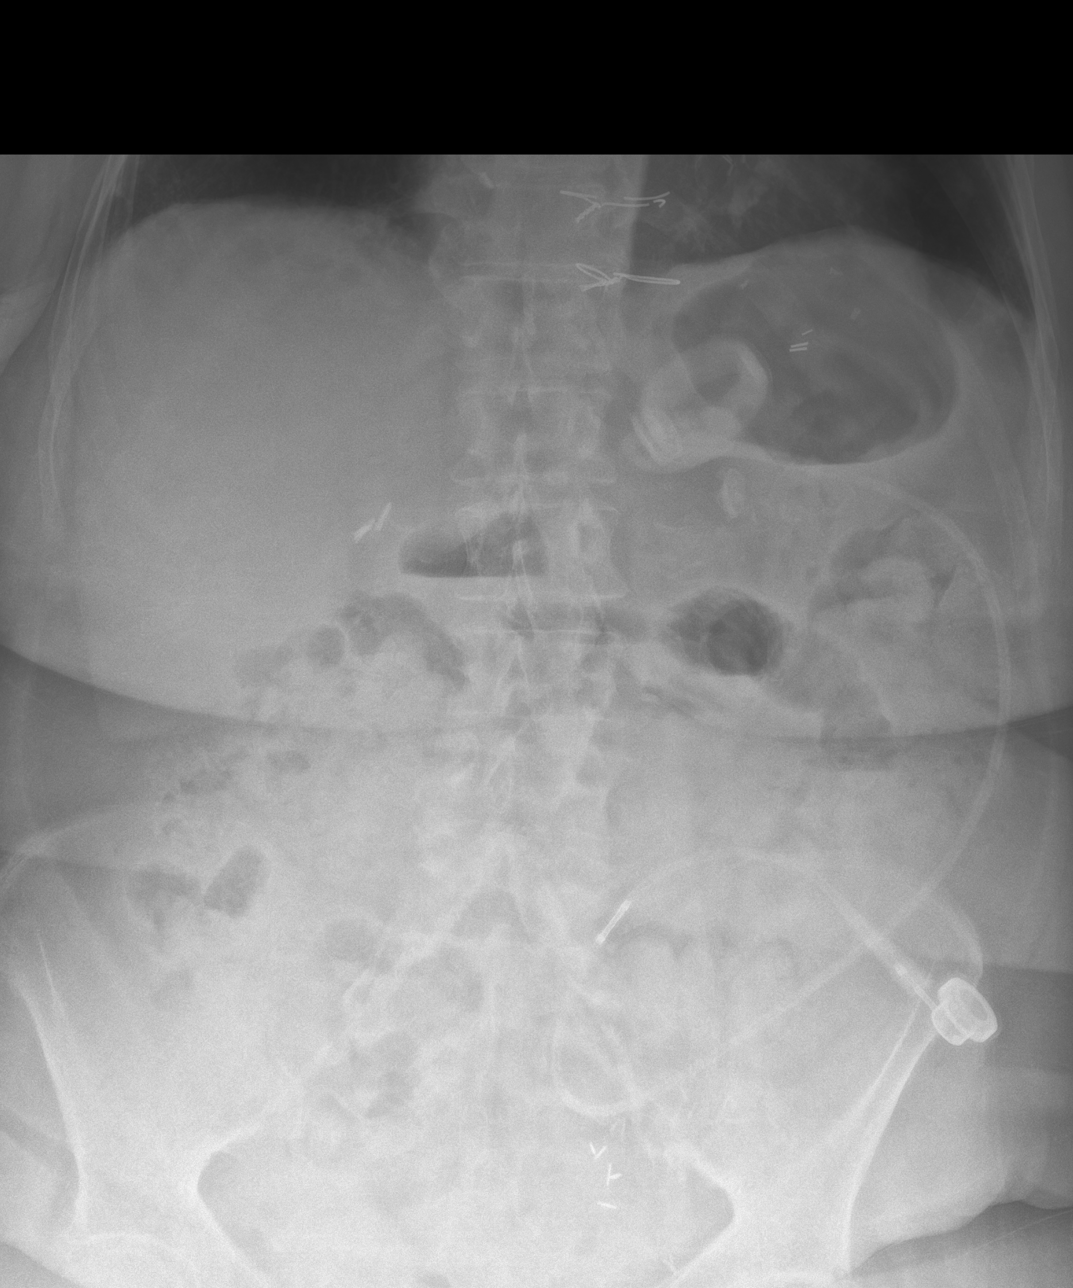

[abdomen supine]
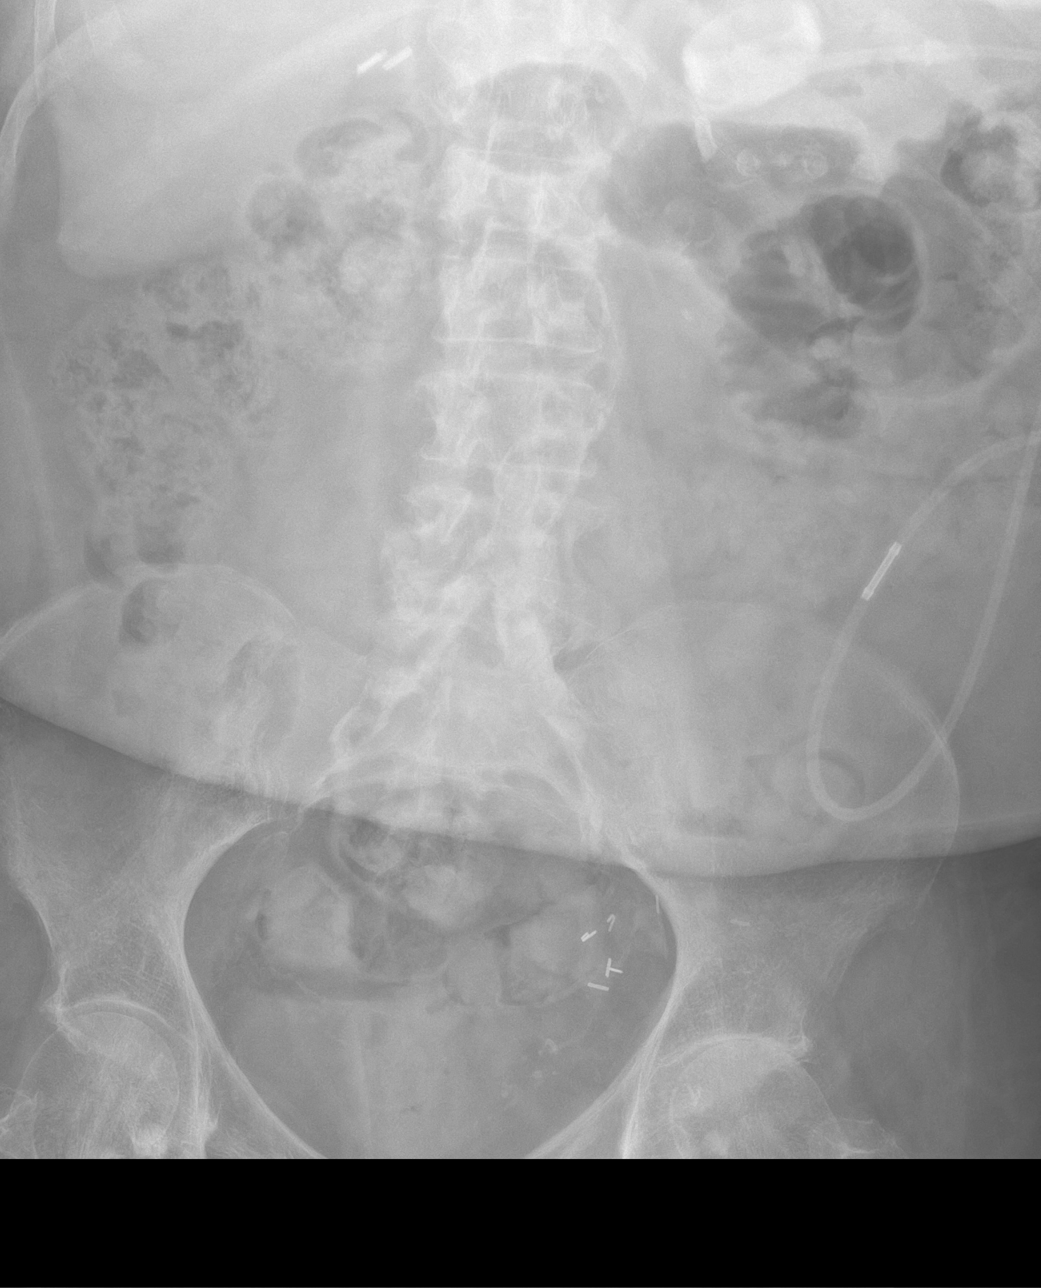

[chest pa]
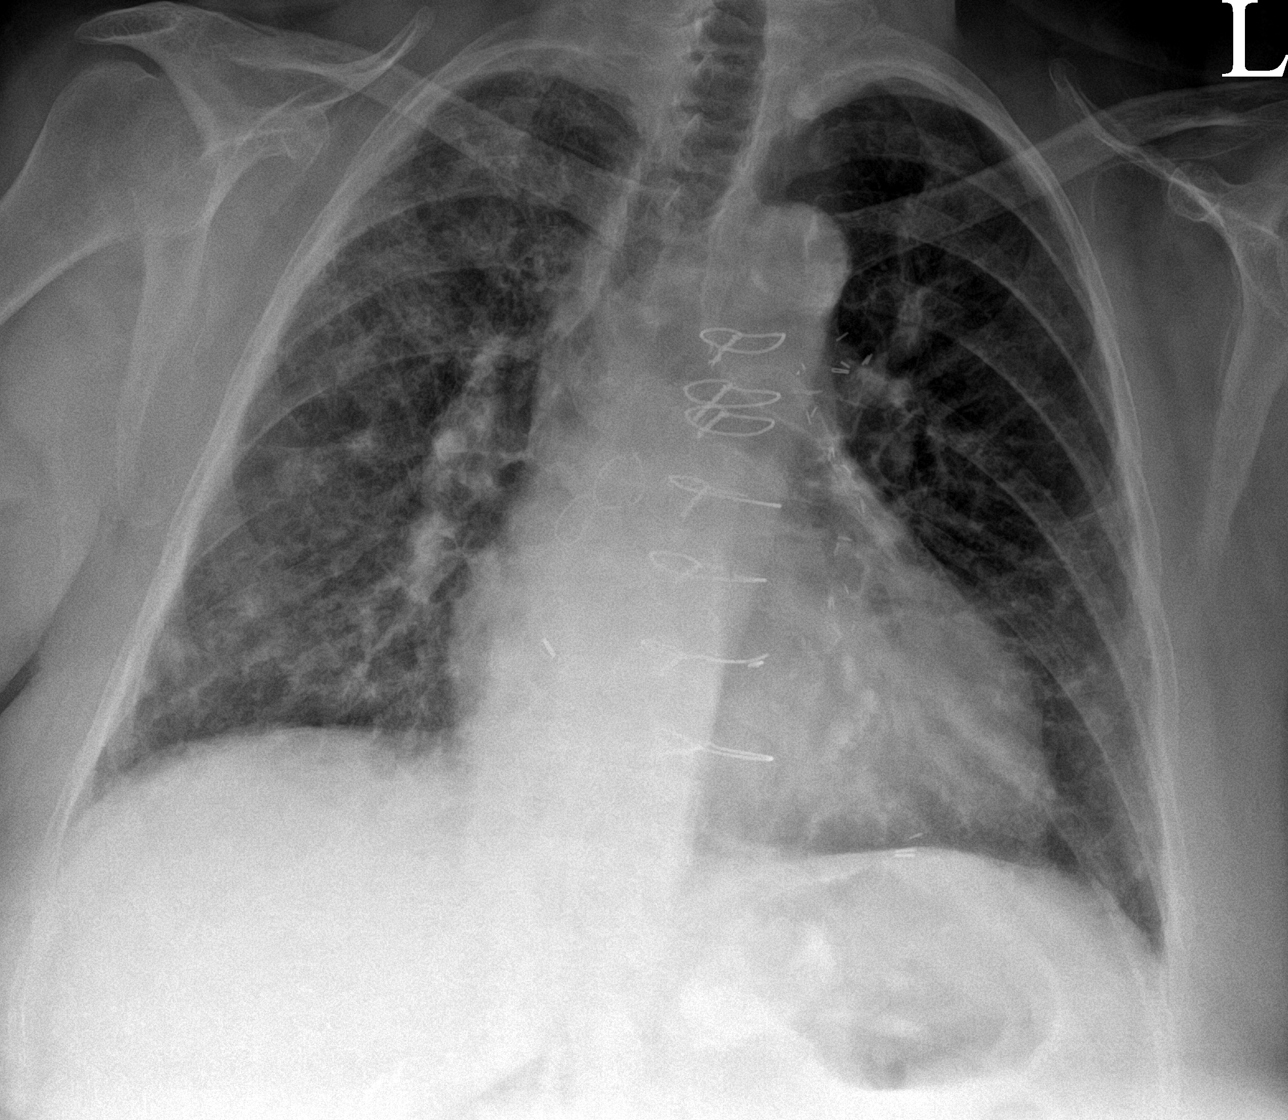

[3 of 3 positions shown; findings below may reference images not displayed]

FINDINGS: There is no evidence of dilated bowel loops or free intraperitoneal
air. Lap band device is noted. Status post cholecystectomy. Heart
size and mediastinal contours are within normal limits. Stable
interstitial densities are noted bilaterally which may represent
scarring, edema or inflammation.
IMPRESSION: No abnormal bowel dilatation is noted. Stable bilateral pulmonary
interstitial densities are noted as described above.

Aortic Atherosclerosis ([id number]).
